# Patient Record
Sex: Female | Born: 1975
Health system: Southern US, Community
[De-identification: ages and names within clinical notes are randomized; demographics above are authoritative.]

## PROBLEM LIST (undated history)

## (undated) DIAGNOSIS — F329 Major depressive disorder, single episode, unspecified: Secondary | ICD-10-CM

## (undated) DIAGNOSIS — N179 Acute kidney failure, unspecified: Secondary | ICD-10-CM

## (undated) DIAGNOSIS — D62 Acute posthemorrhagic anemia: Secondary | ICD-10-CM

## (undated) DIAGNOSIS — I209 Angina pectoris, unspecified: Secondary | ICD-10-CM

## (undated) DIAGNOSIS — J42 Unspecified chronic bronchitis: Secondary | ICD-10-CM

## (undated) DIAGNOSIS — L0291 Cutaneous abscess, unspecified: Secondary | ICD-10-CM

## (undated) DIAGNOSIS — Z8489 Family history of other specified conditions: Secondary | ICD-10-CM

## (undated) DIAGNOSIS — F32A Depression, unspecified: Secondary | ICD-10-CM

## (undated) DIAGNOSIS — Z9289 Personal history of other medical treatment: Secondary | ICD-10-CM

## (undated) DIAGNOSIS — F419 Anxiety disorder, unspecified: Secondary | ICD-10-CM

## (undated) DIAGNOSIS — E785 Hyperlipidemia, unspecified: Secondary | ICD-10-CM

## (undated) DIAGNOSIS — R0902 Hypoxemia: Secondary | ICD-10-CM

## (undated) DIAGNOSIS — G8929 Other chronic pain: Secondary | ICD-10-CM

## (undated) DIAGNOSIS — I96 Gangrene, not elsewhere classified: Secondary | ICD-10-CM

## (undated) DIAGNOSIS — E119 Type 2 diabetes mellitus without complications: Secondary | ICD-10-CM

## (undated) DIAGNOSIS — M545 Low back pain: Secondary | ICD-10-CM

## (undated) DIAGNOSIS — G629 Polyneuropathy, unspecified: Secondary | ICD-10-CM

## (undated) DIAGNOSIS — I38 Endocarditis, valve unspecified: Secondary | ICD-10-CM

## (undated) DIAGNOSIS — M7918 Myalgia, other site: Secondary | ICD-10-CM

## (undated) DIAGNOSIS — R600 Localized edema: Secondary | ICD-10-CM

## (undated) DIAGNOSIS — H269 Unspecified cataract: Secondary | ICD-10-CM

## (undated) DIAGNOSIS — Z8619 Personal history of other infectious and parasitic diseases: Secondary | ICD-10-CM

## (undated) DIAGNOSIS — D72829 Elevated white blood cell count, unspecified: Secondary | ICD-10-CM

## (undated) DIAGNOSIS — E039 Hypothyroidism, unspecified: Secondary | ICD-10-CM

## (undated) DIAGNOSIS — K219 Gastro-esophageal reflux disease without esophagitis: Secondary | ICD-10-CM

## (undated) DIAGNOSIS — F319 Bipolar disorder, unspecified: Secondary | ICD-10-CM

## (undated) DIAGNOSIS — I509 Heart failure, unspecified: Secondary | ICD-10-CM

## (undated) DIAGNOSIS — W19XXXA Unspecified fall, initial encounter: Secondary | ICD-10-CM

## (undated) DIAGNOSIS — R011 Cardiac murmur, unspecified: Secondary | ICD-10-CM

## (undated) DIAGNOSIS — G4733 Obstructive sleep apnea (adult) (pediatric): Secondary | ICD-10-CM

## (undated) DIAGNOSIS — E669 Obesity, unspecified: Secondary | ICD-10-CM

## (undated) DIAGNOSIS — L0390 Cellulitis, unspecified: Secondary | ICD-10-CM

## (undated) DIAGNOSIS — J189 Pneumonia, unspecified organism: Secondary | ICD-10-CM

## (undated) DIAGNOSIS — Z9989 Dependence on other enabling machines and devices: Secondary | ICD-10-CM

## (undated) DIAGNOSIS — B009 Herpesviral infection, unspecified: Secondary | ICD-10-CM

## (undated) DIAGNOSIS — T8781 Dehiscence of amputation stump: Secondary | ICD-10-CM

## (undated) DIAGNOSIS — M797 Fibromyalgia: Secondary | ICD-10-CM

## (undated) DIAGNOSIS — D638 Anemia in other chronic diseases classified elsewhere: Secondary | ICD-10-CM

## (undated) DIAGNOSIS — E43 Unspecified severe protein-calorie malnutrition: Secondary | ICD-10-CM

## (undated) DIAGNOSIS — R87619 Unspecified abnormal cytological findings in specimens from cervix uteri: Secondary | ICD-10-CM

## (undated) DIAGNOSIS — I10 Essential (primary) hypertension: Secondary | ICD-10-CM

## (undated) DIAGNOSIS — M726 Necrotizing fasciitis: Secondary | ICD-10-CM

## (undated) HISTORY — DX: Acute kidney failure, unspecified: N17.9

## (undated) HISTORY — DX: Acute posthemorrhagic anemia: D62

## (undated) HISTORY — DX: Unspecified severe protein-calorie malnutrition: E43

## (undated) HISTORY — DX: Hypothyroidism, unspecified: E03.9

## (undated) HISTORY — PX: CORONARY ARTERY BYPASS GRAFT: SHX141

## (undated) HISTORY — DX: Dehiscence of amputation stump: T87.81

## (undated) HISTORY — DX: Hypoxemia: R09.02

## (undated) HISTORY — DX: Hyperlipidemia, unspecified: E78.5

## (undated) HISTORY — DX: Elevated white blood cell count, unspecified: D72.829

## (undated) HISTORY — DX: Heart failure, unspecified: I50.9

## (undated) HISTORY — PX: EYE SURGERY: SHX253

## (undated) HISTORY — DX: Myalgia, other site: M79.18

## (undated) HISTORY — DX: Low back pain: M54.5

## (undated) HISTORY — DX: Essential (primary) hypertension: I10

## (undated) HISTORY — DX: Unspecified fall, initial encounter: W19.XXXA

## (undated) HISTORY — DX: Personal history of other infectious and parasitic diseases: Z86.19

## (undated) HISTORY — DX: Localized edema: R60.0

## (undated) HISTORY — PX: INCISION AND DRAINAGE ABSCESS: SHX5864

---
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
## 1997-09-09 ENCOUNTER — Encounter: Admission: RE | Admit: 1997-09-09 | Discharge: 1997-09-09 | Payer: Self-pay | Admitting: Internal Medicine

## 1998-09-08 ENCOUNTER — Emergency Department (HOSPITAL_COMMUNITY): Admission: EM | Admit: 1998-09-08 | Discharge: 1998-09-08 | Payer: Self-pay | Admitting: Emergency Medicine

## 1998-09-21 ENCOUNTER — Emergency Department (HOSPITAL_COMMUNITY): Admission: EM | Admit: 1998-09-21 | Discharge: 1998-09-21 | Payer: Self-pay | Admitting: Emergency Medicine

## 1998-09-24 ENCOUNTER — Encounter: Admission: RE | Admit: 1998-09-24 | Discharge: 1998-09-24 | Payer: Self-pay | Admitting: Internal Medicine

## 1998-10-12 ENCOUNTER — Encounter: Admission: RE | Admit: 1998-10-12 | Discharge: 1998-10-12 | Payer: Self-pay | Admitting: Internal Medicine

## 1998-11-19 ENCOUNTER — Encounter: Admission: RE | Admit: 1998-11-19 | Discharge: 1998-11-19 | Payer: Self-pay | Admitting: Hematology and Oncology

## 2000-01-23 ENCOUNTER — Emergency Department (HOSPITAL_COMMUNITY): Admission: EM | Admit: 2000-01-23 | Discharge: 2000-01-23 | Payer: Self-pay | Admitting: Emergency Medicine

## 2000-01-28 ENCOUNTER — Encounter: Admission: RE | Admit: 2000-01-28 | Discharge: 2000-01-28 | Payer: Self-pay | Admitting: Internal Medicine

## 2000-02-08 ENCOUNTER — Encounter: Admission: RE | Admit: 2000-02-08 | Discharge: 2000-02-08 | Payer: Self-pay | Admitting: Hematology and Oncology

## 2000-04-17 ENCOUNTER — Encounter: Admission: RE | Admit: 2000-04-17 | Discharge: 2000-04-17 | Payer: Self-pay | Admitting: Internal Medicine

## 2000-05-23 DIAGNOSIS — I38 Endocarditis, valve unspecified: Secondary | ICD-10-CM

## 2000-05-23 DIAGNOSIS — D638 Anemia in other chronic diseases classified elsewhere: Secondary | ICD-10-CM

## 2000-05-23 HISTORY — DX: Endocarditis, valve unspecified: I38

## 2000-05-23 HISTORY — DX: Anemia in other chronic diseases classified elsewhere: D63.8

## 2000-05-30 ENCOUNTER — Encounter: Payer: Self-pay | Admitting: Emergency Medicine

## 2000-05-31 ENCOUNTER — Inpatient Hospital Stay (HOSPITAL_COMMUNITY): Admission: EM | Admit: 2000-05-31 | Discharge: 2000-06-22 | Payer: Self-pay | Admitting: Emergency Medicine

## 2000-06-01 ENCOUNTER — Encounter: Payer: Self-pay | Admitting: Internal Medicine

## 2000-06-06 ENCOUNTER — Encounter: Payer: Self-pay | Admitting: Internal Medicine

## 2000-06-10 ENCOUNTER — Encounter: Payer: Self-pay | Admitting: Infectious Diseases

## 2000-06-12 ENCOUNTER — Encounter: Payer: Self-pay | Admitting: Infectious Diseases

## 2000-06-15 ENCOUNTER — Encounter: Payer: Self-pay | Admitting: Infectious Diseases

## 2000-07-20 ENCOUNTER — Ambulatory Visit (HOSPITAL_COMMUNITY): Admission: RE | Admit: 2000-07-20 | Discharge: 2000-07-20 | Payer: Self-pay | Admitting: Cardiovascular Disease

## 2000-07-27 ENCOUNTER — Encounter: Admission: RE | Admit: 2000-07-27 | Discharge: 2000-07-27 | Payer: Self-pay | Admitting: Hematology and Oncology

## 2000-08-28 ENCOUNTER — Encounter: Admission: RE | Admit: 2000-08-28 | Discharge: 2000-08-28 | Payer: Self-pay | Admitting: Internal Medicine

## 2001-07-23 ENCOUNTER — Encounter: Admission: RE | Admit: 2001-07-23 | Discharge: 2001-07-23 | Payer: Self-pay | Admitting: Internal Medicine

## 2001-08-06 ENCOUNTER — Encounter: Admission: RE | Admit: 2001-08-06 | Discharge: 2001-08-06 | Payer: Self-pay | Admitting: Internal Medicine

## 2001-08-20 ENCOUNTER — Encounter: Admission: RE | Admit: 2001-08-20 | Discharge: 2001-08-20 | Payer: Self-pay | Admitting: Internal Medicine

## 2001-11-02 ENCOUNTER — Encounter: Admission: RE | Admit: 2001-11-02 | Discharge: 2001-11-02 | Payer: Self-pay | Admitting: Internal Medicine

## 2001-12-24 ENCOUNTER — Encounter: Admission: RE | Admit: 2001-12-24 | Discharge: 2001-12-24 | Payer: Self-pay | Admitting: Internal Medicine

## 2002-03-18 ENCOUNTER — Encounter: Admission: RE | Admit: 2002-03-18 | Discharge: 2002-03-18 | Payer: Self-pay | Admitting: Internal Medicine

## 2002-03-25 ENCOUNTER — Encounter: Admission: RE | Admit: 2002-03-25 | Discharge: 2002-03-25 | Payer: Self-pay | Admitting: Internal Medicine

## 2002-05-06 ENCOUNTER — Encounter: Admission: RE | Admit: 2002-05-06 | Discharge: 2002-05-06 | Payer: Self-pay | Admitting: Internal Medicine

## 2003-04-08 ENCOUNTER — Encounter: Admission: RE | Admit: 2003-04-08 | Discharge: 2003-04-08 | Payer: Self-pay | Admitting: Internal Medicine

## 2003-08-04 ENCOUNTER — Encounter: Admission: RE | Admit: 2003-08-04 | Discharge: 2003-08-04 | Payer: Self-pay | Admitting: Internal Medicine

## 2005-12-22 ENCOUNTER — Ambulatory Visit: Payer: Self-pay | Admitting: Internal Medicine

## 2005-12-29 ENCOUNTER — Ambulatory Visit: Payer: Self-pay | Admitting: Internal Medicine

## 2006-01-05 ENCOUNTER — Ambulatory Visit: Payer: Self-pay | Admitting: Internal Medicine

## 2006-03-03 ENCOUNTER — Ambulatory Visit: Payer: Self-pay | Admitting: Internal Medicine

## 2006-03-08 ENCOUNTER — Encounter: Payer: Self-pay | Admitting: Cardiology

## 2006-03-08 ENCOUNTER — Ambulatory Visit: Admission: RE | Admit: 2006-03-08 | Discharge: 2006-03-08 | Payer: Self-pay | Admitting: Internal Medicine

## 2006-03-08 ENCOUNTER — Ambulatory Visit: Payer: Self-pay | Admitting: Cardiology

## 2006-03-20 ENCOUNTER — Ambulatory Visit: Payer: Self-pay | Admitting: Internal Medicine

## 2007-05-24 DIAGNOSIS — R87619 Unspecified abnormal cytological findings in specimens from cervix uteri: Secondary | ICD-10-CM

## 2007-05-24 HISTORY — DX: Unspecified abnormal cytological findings in specimens from cervix uteri: R87.619

## 2007-08-25 ENCOUNTER — Emergency Department (HOSPITAL_COMMUNITY): Admission: EM | Admit: 2007-08-25 | Discharge: 2007-08-25 | Payer: Self-pay | Admitting: Family Medicine

## 2007-08-29 ENCOUNTER — Ambulatory Visit: Payer: Self-pay | Admitting: Advanced Practice Midwife

## 2007-08-29 ENCOUNTER — Ambulatory Visit: Payer: Self-pay | Admitting: Infectious Disease

## 2007-08-29 ENCOUNTER — Inpatient Hospital Stay (HOSPITAL_COMMUNITY): Admission: AD | Admit: 2007-08-29 | Discharge: 2007-08-29 | Payer: Self-pay | Admitting: Obstetrics and Gynecology

## 2007-08-29 DIAGNOSIS — L0292 Furuncle, unspecified: Secondary | ICD-10-CM | POA: Insufficient documentation

## 2007-08-29 DIAGNOSIS — E1159 Type 2 diabetes mellitus with other circulatory complications: Secondary | ICD-10-CM | POA: Insufficient documentation

## 2007-08-29 DIAGNOSIS — I10 Essential (primary) hypertension: Secondary | ICD-10-CM | POA: Insufficient documentation

## 2007-08-29 DIAGNOSIS — E785 Hyperlipidemia, unspecified: Secondary | ICD-10-CM | POA: Insufficient documentation

## 2007-08-29 DIAGNOSIS — E1122 Type 2 diabetes mellitus with diabetic chronic kidney disease: Secondary | ICD-10-CM | POA: Insufficient documentation

## 2007-08-29 DIAGNOSIS — L0293 Carbuncle, unspecified: Secondary | ICD-10-CM

## 2007-11-26 ENCOUNTER — Telehealth (INDEPENDENT_AMBULATORY_CARE_PROVIDER_SITE_OTHER): Payer: Self-pay | Admitting: Internal Medicine

## 2007-12-06 ENCOUNTER — Encounter: Payer: Self-pay | Admitting: Internal Medicine

## 2007-12-06 ENCOUNTER — Encounter (INDEPENDENT_AMBULATORY_CARE_PROVIDER_SITE_OTHER): Payer: Self-pay | Admitting: Internal Medicine

## 2007-12-06 ENCOUNTER — Ambulatory Visit: Payer: Self-pay | Admitting: *Deleted

## 2007-12-06 DIAGNOSIS — K219 Gastro-esophageal reflux disease without esophagitis: Secondary | ICD-10-CM | POA: Insufficient documentation

## 2007-12-06 LAB — CONVERTED CEMR LAB
Chlamydia, DNA Probe: NEGATIVE
GC Probe Amp, Genital: NEGATIVE
Hgb A1c MFr Bld: 13.6 %
Pap Smear: ABNORMAL

## 2007-12-07 LAB — CONVERTED CEMR LAB
ALT: 15 units/L (ref 0–35)
AST: 12 units/L (ref 0–37)
BUN: 13 mg/dL (ref 6–23)
Basophils Absolute: 0 10*3/uL (ref 0.0–0.1)
Basophils Relative: 0 % (ref 0–1)
Bilirubin Urine: NEGATIVE
CO2: 24 meq/L (ref 19–32)
Calcium: 9.6 mg/dL (ref 8.4–10.5)
Candida species: NEGATIVE
Cholesterol: 226 mg/dL — ABNORMAL HIGH (ref 0–200)
Creatinine, Ser: 0.81 mg/dL (ref 0.40–1.20)
Eosinophils Relative: 1 % (ref 0–5)
HCT: 41.2 % (ref 36.0–46.0)
HDL: 46 mg/dL (ref >39)
Hemoglobin: 13.2 g/dL (ref 12.0–15.0)
Ketones, ur: NEGATIVE mg/dL
MCHC: 32 g/dL (ref 30.0–36.0)
Monocytes Absolute: 0.3 10*3/uL (ref 0.1–1.0)
Monocytes Relative: 7 % (ref 3–12)
RBC / HPF: NONE SEEN (ref <3)
RBC: 5.05 M/uL (ref 3.87–5.11)
RDW: 13.6 % (ref 11.5–15.5)
Specific Gravity, Urine: 1.031 — ABNORMAL HIGH (ref 1.005–1.03)
Total Bilirubin: 0.5 mg/dL (ref 0.3–1.2)
Total CHOL/HDL Ratio: 4.9
Trichomonal Vaginitis: NEGATIVE
Urine Glucose: >1000 mg/dL — AB
Urobilinogen, UA: 0.2 (ref 0.0–1.0)
VLDL: 23 mg/dL (ref 0–40)
WBC, UA: NONE SEEN cells/hpf (ref <3)

## 2007-12-13 ENCOUNTER — Ambulatory Visit: Payer: Self-pay | Admitting: Infectious Diseases

## 2007-12-13 ENCOUNTER — Ambulatory Visit: Payer: Self-pay | Admitting: Internal Medicine

## 2007-12-13 DIAGNOSIS — Z8742 Personal history of other diseases of the female genital tract: Secondary | ICD-10-CM | POA: Insufficient documentation

## 2007-12-13 LAB — CONVERTED CEMR LAB: Blood Glucose, Fingerstick: 343

## 2008-02-06 ENCOUNTER — Ambulatory Visit: Payer: Self-pay | Admitting: Obstetrics and Gynecology

## 2008-02-06 ENCOUNTER — Other Ambulatory Visit: Admission: RE | Admit: 2008-02-06 | Discharge: 2008-02-06 | Payer: Self-pay | Admitting: *Deleted

## 2008-03-18 ENCOUNTER — Encounter (INDEPENDENT_AMBULATORY_CARE_PROVIDER_SITE_OTHER): Payer: Self-pay | Admitting: Internal Medicine

## 2008-03-24 ENCOUNTER — Telehealth: Payer: Self-pay | Admitting: *Deleted

## 2008-08-21 ENCOUNTER — Inpatient Hospital Stay (HOSPITAL_COMMUNITY): Admission: AD | Admit: 2008-08-21 | Discharge: 2008-08-21 | Payer: Self-pay | Admitting: Obstetrics & Gynecology

## 2008-11-29 ENCOUNTER — Other Ambulatory Visit: Payer: Self-pay | Admitting: Obstetrics & Gynecology

## 2008-11-29 ENCOUNTER — Other Ambulatory Visit: Payer: Self-pay | Admitting: Emergency Medicine

## 2008-11-29 ENCOUNTER — Inpatient Hospital Stay (HOSPITAL_COMMUNITY): Admission: EM | Admit: 2008-11-29 | Discharge: 2008-12-02 | Payer: Self-pay | Admitting: Internal Medicine

## 2008-11-29 ENCOUNTER — Ambulatory Visit: Payer: Self-pay | Admitting: Internal Medicine

## 2008-11-29 ENCOUNTER — Encounter (INDEPENDENT_AMBULATORY_CARE_PROVIDER_SITE_OTHER): Payer: Self-pay | Admitting: Internal Medicine

## 2008-11-30 ENCOUNTER — Encounter: Payer: Self-pay | Admitting: Internal Medicine

## 2008-11-30 ENCOUNTER — Encounter: Payer: Self-pay | Admitting: *Deleted

## 2008-11-30 LAB — CONVERTED CEMR LAB: HDL: 46 mg/dL

## 2008-12-02 ENCOUNTER — Encounter (INDEPENDENT_AMBULATORY_CARE_PROVIDER_SITE_OTHER): Payer: Self-pay | Admitting: Internal Medicine

## 2008-12-11 ENCOUNTER — Encounter: Payer: Self-pay | Admitting: *Deleted

## 2009-02-14 ENCOUNTER — Inpatient Hospital Stay (HOSPITAL_COMMUNITY): Admission: EM | Admit: 2009-02-14 | Discharge: 2009-02-18 | Payer: Self-pay | Admitting: Emergency Medicine

## 2009-02-16 ENCOUNTER — Ambulatory Visit: Payer: Self-pay | Admitting: Infectious Diseases

## 2009-02-19 ENCOUNTER — Encounter: Payer: Self-pay | Admitting: Internal Medicine

## 2009-08-18 ENCOUNTER — Encounter: Payer: Self-pay | Admitting: *Deleted

## 2009-12-15 ENCOUNTER — Telehealth (INDEPENDENT_AMBULATORY_CARE_PROVIDER_SITE_OTHER): Payer: Self-pay | Admitting: *Deleted

## 2010-03-21 ENCOUNTER — Ambulatory Visit: Payer: Self-pay | Admitting: Internal Medicine

## 2010-03-21 ENCOUNTER — Inpatient Hospital Stay (HOSPITAL_COMMUNITY): Admission: EM | Admit: 2010-03-21 | Discharge: 2010-03-28 | Payer: Self-pay | Admitting: Emergency Medicine

## 2010-03-21 ENCOUNTER — Encounter: Payer: Self-pay | Admitting: Internal Medicine

## 2010-03-26 ENCOUNTER — Encounter: Payer: Self-pay | Admitting: Internal Medicine

## 2010-04-28 ENCOUNTER — Encounter: Payer: Self-pay | Admitting: Internal Medicine

## 2010-06-24 NOTE — Discharge Summary (Signed)
Summary: Hospital Discharge Update    Hospital Discharge Update:  Date of Admission: 04/21/2010 Date of Discharge: 03/27/2010  Brief Summary:  Patient is a 35 year old woman with a history of poorly controlled DM II and MRSA skin abcesses who presented to [facility] ED with a painful draining nodule on her abdomen.  She first noted a small pustule on her abdomen the Thursday prior to admission that she put a heating pad on and popped it when it came to a head.  Over the next 3 days it continued to drain and get more sore so she went to the ED.  She was admitted and started on IV antibiotics and pain control medications.  She was found to also have a HgBA1C of 13.4 and she admitted that she was not taking her insulin on a regular basis because of cost.  She was started on Novalin 70/30 and restarted on Metformin slowly tapering up to the goal dose of 1000 mg two times a day.  Two days prior to discharge wound culture results showed MRSA that was susceptible to Bactrim.  General surgery was consulted for possible need for debridement of the wound bed.  They evaluated the wound and scheduled her for wound debridement.  The next morning she had the wound debrided and was transitioned to oral antibiotics.  She was discharged the day after surgery with instructions to continue 3 more weeks of bactrim for a total course of antibiotics of 4 weeks.  She will follow up with Dr. Nedra Hai on 04/12/10 at 2:40 pm and [facility] on 04/02/10 at 1:30 pm with Dr. Ronny Flurry.  At her follow up appointment she will need a bmet done for watching her blood sugars.  Her prescriptions for Bactrim DS, Metformin 500 mg two times a day, and Novalin 70/30 [facility] [address]  Labs needed at follow-up: Basic metabolic panel  Other follow-up issues:  She will need to visit with Marlana Latus the financial counselor to try to qualify for the orange card for assistance with medication costs.  Problem list changes:  Removed  problem of VAGINAL DISCHARGE (ICD-623.5)  Medication list changes:  Removed medication of GLIPIZIDE 10 MG TABS (GLIPIZIDE) Take 1 tablet by mouth two times a day Removed medication of AMOXICILLIN-POT CLAVULANATE 875-125 MG TABS (AMOXICILLIN-POT CLAVULANATE) Take one tab by mouth every 12 hrs for 7 days. Removed medication of HYDROCODONE-ACETAMINOPHEN 5-500 MG TABS (HYDROCODONE-ACETAMINOPHEN) Take one tab by mouth every 6 hrs as needed for pain. Removed medication of FAMOTIDINE 20 MG  TABS (FAMOTIDINE) Take 1 tablet by mouth two times a day Changed medication from METFORMIN HCL 1000 MG  TABS (METFORMIN HCL) Take 1 tablet by mouth two times a day to METFORMIN HCL 500 MG TABS (METFORMIN HCL) Take 1 tablet by mouth two times a day for blood sugars - Signed Added new medication of SULFAMETHOXAZOLE-TMP DS 800-160 MG TABS (SULFAMETHOXAZOLE-TRIMETHOPRIM) Take 2 tablet by mouth two times a day - Signed Added new medication of NOVOLIN 70/30 70-30 % SUSP (INSULIN ISOPHANE & REGULAR) Inject 20 units subcutaneously in the morning and before bed. - Signed Added new medication of LISINOPRIL 5 MG TABS (LISINOPRIL) Take 1 tablet by mouth once a day - Signed Rx of METFORMIN HCL 500 MG TABS (METFORMIN HCL) Take 1 tablet by mouth two times a day for blood sugars;  #60 x 1;  Signed;  Entered by: Trish Fountain MD;  Authorized by: Trish Fountain MD;  Method used:  [facility]  [address] [id number]*, [address] [place], [place], [place]  [address], Ph: [phone], Fax: [phone] Rx of SULFAMETHOXAZOLE-TMP DS 800-160 MG TABS (SULFAMETHOXAZOLE-TRIMETHOPRIM) Take 2 tablet by mouth two times a day;  #96 x 0;  Signed;  Entered by: Trish Fountain MD;  Authorized by: Trish Fountain MD;  Method used: [facility]  [address] [facility] [phone]*, [address] [place], [place], [place]  [address], Ph: [phone], Fax: [phone] Rx of NOVOLIN 70/30 70-30 % SUSP (INSULIN ISOPHANE &  REGULAR) Inject 20 units subcutaneously in the morning and before bed.;  #2 vials x 1;  Signed;  Entered by: Trish Fountain MD;  Authorized by: Trish Fountain MD;  Method used: [facility] [phone]*, [address], [place], [place], [place]  [address], Ph: [phone], Fax: [phone] Rx of LISINOPRIL 5 MG TABS (LISINOPRIL) Take 1 tablet by mouth once a day;  #30 x 1;  Signed;  Entered by: Trish Fountain MD;  Authorized by: Trish Fountain MD;  Method used: [facility]  [address] [phone]*, [address] [place], [place],   [address], Ph: [phone], Fax: [phone]  Discharge medications:  METFORMIN HCL 500 MG TABS (METFORMIN HCL) Take 1 tablet by mouth two times a day for blood sugars PRAVACHOL 40 MG  TABS (PRAVASTATIN SODIUM) Take 1 tablet by mouth once a day SULFAMETHOXAZOLE-TMP DS 800-160 MG TABS (SULFAMETHOXAZOLE-TRIMETHOPRIM) Take 2 tablet by mouth two times a day NOVOLIN 70/30 70-30 % SUSP (INSULIN ISOPHANE & REGULAR) Inject 20 units subcutaneously in the morning and before bed. LISINOPRIL 5 MG TABS (LISINOPRIL) Take 1 tablet by mouth once a day  Other patient instructions:  Please take BACTRIM DS 2 tablets twice daily until 11/27 for the skin infection We restarted METFORMIN 500 mg two times a day WE started NOVOLIN 70/30 inject 20 units Subcutaneously twice daily Continue LISINOPRIL 5 mg daily Continue Pravachol 40 mg daily You have a follow up with the [facility] on 04/02/10 at 1:30 pm with Dr. Iris Pert have a follow up with Dr. Ninfa Linden on 04/12/10 at 2:30 pm.  You need to be there by 2:15 IF you have any problems please call the [facility] at [phone]  Note: Hospital Discharge Medications & Other Instructions handout was printed, one copy for patient and a second copy to be placed in hospital chart.

## 2010-06-24 NOTE — Miscellaneous (Signed)
Summary: [facility] CERTIFICATION   Imported By: Enedina Finner 05/06/2010 09:39:29  _____________________________________________________________________  External Attachment:    Type:   Image     Comment:   External Document

## 2010-06-24 NOTE — Progress Notes (Signed)
Summary: Appt List for [name] [name]  Phone Note Outgoing Call   Details for Reason: Appt list for [name] [name] Summary of Call: Called patient again to offer an appointment with A PCP for a 6 month check up.  No answer and patient was also sent a letter in March and did not respond. Initial call taken by: Enedina Finner,  December 15, 2009 10:26 AM

## 2010-06-24 NOTE — Initial Assessments (Signed)
INTERNAL MEDICINE ADMISSION HISTORY AND PHYSICAL  Attending: Dr. Desiree Hane First contact: Dr. Obie Dredge [phone] Second contact: Dr. Ina Homes  [phone] [facility], after-hours: [phone]  PCP: Dr. Tyrell Antonio  CC: painful, draining nodule on abdomen  HPI: [name] is a 35 year old woman with poorly controlled DM2 who presented to the ED complaining of a painful, draining subcutaneous nodule on her abdomen. She has a history of multiple skin abscesses over the past several years and was twice hospitalized in 2010 for abscess that required surgical I&D and IV antibiotics. She first noticed a small pustule on her abdomen on Thursday, which she believed to be a whitehead or ingrown hair. She squeezed this pustule and expelled a small amout of white pus. Since Friday, however, this lesion has grown in size and has become acutely painful. She describes this pain as "stabbing" into her abdomen. She also endorses intermittent chills and fevers for the past few days as well as some left-sided arm numbess, headache, and blurry vision. She also reports that her CBGs, which are normally in the 200s, have been in the 3-500s over the past few days.   ALLERGIES: NKDA  PAST MEDICAL HISTORY: Diabetes mellitus, type II: A1C 12.9 in 01/2009 Hyperlipidemia: LDL 125, HDL18, TG178, CH179 in 01/2009 Hypertension Anemia: baseline 10 (iron deficiency and AOCD) Morbid obesity Hx of perianal abscess s/p I&D in 01/2009 (E. 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 No children. Smokes  ~4cigarettes/day x 8 years but it trying to quit. Denies alcohol. Reports occassional marijuana use but not for the past 5-6 months. Denies other drug use.    FAMILY HISTORY mother CHF, DM2, chronic kidney disease on HD father: in prison siblings: no medical problems   ROS: per HPI; also positive for non-productive cough and chronic constipation; all other systems negative  VITALS: T: 99.4 P: 118>99 BP:117/69  R: 20 O2SAT: 96% ON:RA  PHYSICAL EXAM: GEN: NAD, obese HEENT: PERRL, EOMI, no icterus, no pallor NECK: supple, no JVD, no cervical LN LUNGS: clear to auscultation bilaterally, no wheezing/crackles.  CVS: RRR, distant heart sounds, no murmur/rubs/gallops ABD: 1x2cm open skin lesion with yellow, purulent drainage, surrounding erythema, and induration ( ~10x10cm) EXTREMITIES: Trace edema or cyanosis NEURO: Alert &Oriented x3, no focal motor or sensory deficits.     LABS: CBC+Diff  WBC                                      10.7  h      4.0-10.5         K/uL  RBC                                      3.83       l      3.87-5.11        MIL/uL  Hemoglobin (HGB)                         9.9        l      12.0-15.0        g/dL  Hematocrit (HCT)                         30.0       l      36.0-46.0        %  MCV                                      78.3              78.0-100.0       fL  MCH -                                    25.8       l      26.0-34.0        pg  MCHC                                      33.0              30.0-36.0        g/dL  RDW                                      13.4              11.5-15.5        %  Platelet Count (PLT)                     248               150-400          K/uL  Neutrophils, %                           74                43-77            %  Lymphocytes, %                           15                12-46            %  Monocytes, %  10                3-12             %  Eosinophils, %                           1                 0-5              %  Basophils, %                             0                 0-1              %  Neutrophils, Absolute                    7.9        h      1.7-7.7          K/uL  Lymphocytes, Absolute                    1.6               0.7-4.0          K/uL  Monocytes, Absolute                      1.1        h      0.1-1.0          K/uL  Eosinophils, Absolute                    0.1               0.0-0.7          K/uL  Basophils, Absolute                      0.0               0.0-0.1          K/uL  BMET  Sodium (NA)                              132        l      135-145          mEq/L  Potassium (K)                            3.9               3.5-5.1          mEq/L  Chloride                                 98                96-112           mEq/L  CO2  26                19-32            mEq/L  Glucose                                  341        h      70-99            mg/dL  BUN                                      8                 6-23             mg/dL  Creatinine                               0.75              0.4-1.2          mg/dL  GFR, Est Non African American            >60               >60              mL/min  GFR, Est African American                >60               >60              mL/min    Oversized comment, see footnote  1  Calcium                                  8.7               8.4-10.5         mg/dL   Clinical Data: Difficulty  breathing.  Headache.  History of  hypertension and diabetes.    PORTABLE CHEST - 1 VIEW    Comparison: None.    Findings: 1545 hours.  There are low lung volumes with mild   resulting bibasilar atelectasis.  The heart size and mediastinal   contours are normal.  There is no pleural effusion, edema or   confluent airspace opacity.  Thoracic spine degenerative changes   are present.    IMPRESSION:   Low lung volumes with bibasilar atelectasis.    Read By:  Vivia Ewing,  M.D.   Released By:  Vivia Ewing,  M.D.  [place]:  (1) Draining abdominal skin lesion: The lesion appears open, no fluctuation; does not appear to represent abscess and therefore does not require further I&D. However, surrounding induration is concerning for cellulitis likely related to poorly controlled DM. Will admit for IV antibiotics. Wound culture pending. Recurrent abscesses/infections likely due to poorly controlled DM; however, we will also check HIV and UDS. Wound care consult tomorrow.    (2) DM2: Has not had follow-up in the clinic for more than one year. Discussed with the patient her need to improve diabetic control to avoid future infections. Will  Check A1c, start Lantus, and cover with SSI.  (3) HL: Will restart pravastatin and check FLP.   (4) Tobacco abuse. Will give nicotine patch and consult SW for smoking cessation counseling.   (5)VTE PROPH: lovenox

## 2010-06-24 NOTE — Letter (Signed)
Summary: Recall Letter  [facility]  [address]   [place], [place] [address]   Phone: [phone]  Fax: [phone]    08/18/2009   JOSHLYN SCHIMMOELLER [address] [place], [place]  [address]   Dear  Ms. STEPHANNIE Nuckles,  You are due to follow-up with a doctor at the [facility].  We were unable to contact you by phone.  If you wold like to schedule a visit, please call [phone].    If you are getting your healthcare somewhere else, please call us and we will take your name off our list.  Healthy Regards,    Illene Regulus, Director [facility]

## 2010-07-16 ENCOUNTER — Encounter: Payer: Self-pay | Admitting: Internal Medicine

## 2010-07-16 DIAGNOSIS — R8789 Other abnormal findings in specimens from female genital organs: Secondary | ICD-10-CM

## 2010-07-16 DIAGNOSIS — E119 Type 2 diabetes mellitus without complications: Secondary | ICD-10-CM

## 2010-08-03 LAB — BASIC METABOLIC PANEL
BUN: 7 mg/dL (ref 6–23)
CO2: 28 mEq/L (ref 19–32)
CO2: 28 mEq/L (ref 19–32)
CO2: 29 mEq/L (ref 19–32)
CO2: 30 mEq/L (ref 19–32)
Calcium: 8.4 mg/dL (ref 8.4–10.5)
Calcium: 8.5 mg/dL (ref 8.4–10.5)
Calcium: 8.9 mg/dL (ref 8.4–10.5)
Calcium: 9.1 mg/dL (ref 8.4–10.5)
Chloride: 104 mEq/L (ref 96–112)
Chloride: 105 mEq/L (ref 96–112)
Creatinine, Ser: 0.73 mg/dL (ref 0.4–1.2)
Creatinine, Ser: 0.89 mg/dL (ref 0.4–1.2)
GFR calc Af Amer: 53 mL/min — ABNORMAL LOW (ref >60)
GFR calc Af Amer: >60 mL/min (ref >60)
GFR calc Af Amer: >60 mL/min (ref >60)
GFR calc non Af Amer: >60 mL/min (ref >60)
Glucose, Bld: 116 mg/dL — ABNORMAL HIGH (ref 70–99)
Glucose, Bld: 133 mg/dL — ABNORMAL HIGH (ref 70–99)
Potassium: 4 mEq/L (ref 3.5–5.1)
Sodium: 136 mEq/L (ref 135–145)
Sodium: 137 mEq/L (ref 135–145)

## 2010-08-03 LAB — GLUCOSE, CAPILLARY
Glucose-Capillary: 106 mg/dL — ABNORMAL HIGH (ref 70–99)
Glucose-Capillary: 117 mg/dL — ABNORMAL HIGH (ref 70–99)
Glucose-Capillary: 120 mg/dL — ABNORMAL HIGH (ref 70–99)
Glucose-Capillary: 120 mg/dL — ABNORMAL HIGH (ref 70–99)
Glucose-Capillary: 130 mg/dL — ABNORMAL HIGH (ref 70–99)
Glucose-Capillary: 158 mg/dL — ABNORMAL HIGH (ref 70–99)
Glucose-Capillary: 161 mg/dL — ABNORMAL HIGH (ref 70–99)
Glucose-Capillary: 173 mg/dL — ABNORMAL HIGH (ref 70–99)
Glucose-Capillary: 85 mg/dL (ref 70–99)
Glucose-Capillary: 91 mg/dL (ref 70–99)
Glucose-Capillary: 96 mg/dL (ref 70–99)
Glucose-Capillary: 98 mg/dL (ref 70–99)

## 2010-08-03 LAB — VANCOMYCIN, TROUGH: Vancomycin Tr: 17.9 ug/mL (ref 10.0–20.0)

## 2010-08-03 LAB — CBC
HCT: 27.7 % — ABNORMAL LOW (ref 36.0–46.0)
Hemoglobin: 8.3 g/dL — ABNORMAL LOW (ref 12.0–15.0)
Hemoglobin: 8.9 g/dL — ABNORMAL LOW (ref 12.0–15.0)
MCH: 25.3 pg — ABNORMAL LOW (ref 26.0–34.0)
MCH: 25.4 pg — ABNORMAL LOW (ref 26.0–34.0)
MCH: 25.5 pg — ABNORMAL LOW (ref 26.0–34.0)
MCH: 25.5 pg — ABNORMAL LOW (ref 26.0–34.0)
MCHC: 31.7 g/dL (ref 30.0–36.0)
MCHC: 32.1 g/dL (ref 30.0–36.0)
MCV: 79.5 fL (ref 78.0–100.0)
Platelets: 305 10*3/uL (ref 150–400)
Platelets: 328 10*3/uL (ref 150–400)
RBC: 3.26 MIL/uL — ABNORMAL LOW (ref 3.87–5.11)
RBC: 3.51 MIL/uL — ABNORMAL LOW (ref 3.87–5.11)
RDW: 13.5 % (ref 11.5–15.5)

## 2010-08-03 LAB — DIFFERENTIAL
Basophils Absolute: 0 10*3/uL (ref 0.0–0.1)
Basophils Relative: 1 % (ref 0–1)
Eosinophils Absolute: 0.1 10*3/uL (ref 0.0–0.7)
Eosinophils Relative: 2 % (ref 0–5)

## 2010-08-04 LAB — BASIC METABOLIC PANEL
CO2: 28 mEq/L (ref 19–32)
Calcium: 8.4 mg/dL (ref 8.4–10.5)
Calcium: 8.7 mg/dL (ref 8.4–10.5)
Chloride: 98 mEq/L (ref 96–112)
Creatinine, Ser: 0.75 mg/dL (ref 0.4–1.2)
Creatinine, Ser: 0.8 mg/dL (ref 0.4–1.2)
GFR calc Af Amer: >60 mL/min (ref >60)
GFR calc Af Amer: >60 mL/min (ref >60)
GFR calc non Af Amer: >60 mL/min (ref >60)

## 2010-08-04 LAB — RAPID URINE DRUG SCREEN, HOSP PERFORMED
Amphetamines: NOT DETECTED
Benzodiazepines: NOT DETECTED
Cocaine: NOT DETECTED
Opiates: POSITIVE — AB
Tetrahydrocannabinol: NOT DETECTED

## 2010-08-04 LAB — GLUCOSE, CAPILLARY
Glucose-Capillary: 107 mg/dL — ABNORMAL HIGH (ref 70–99)
Glucose-Capillary: 146 mg/dL — ABNORMAL HIGH (ref 70–99)
Glucose-Capillary: 228 mg/dL — ABNORMAL HIGH (ref 70–99)
Glucose-Capillary: 300 mg/dL — ABNORMAL HIGH (ref 70–99)
Glucose-Capillary: 322 mg/dL — ABNORMAL HIGH (ref 70–99)

## 2010-08-04 LAB — CBC
MCH: 25.7 pg — ABNORMAL LOW (ref 26.0–34.0)
MCHC: 32.6 g/dL (ref 30.0–36.0)
Platelets: 232 10*3/uL (ref 150–400)
Platelets: 248 10*3/uL (ref 150–400)
RBC: 3.66 MIL/uL — ABNORMAL LOW (ref 3.87–5.11)
RBC: 3.83 MIL/uL — ABNORMAL LOW (ref 3.87–5.11)
RDW: 13.3 % (ref 11.5–15.5)
WBC: 10.7 10*3/uL — ABNORMAL HIGH (ref 4.0–10.5)

## 2010-08-04 LAB — LIPID PANEL
Cholesterol: 146 mg/dL (ref 0–200)
LDL Cholesterol: 94 mg/dL (ref 0–99)

## 2010-08-04 LAB — WOUND CULTURE

## 2010-08-04 LAB — DIFFERENTIAL
Basophils Absolute: 0 10*3/uL (ref 0.0–0.1)
Basophils Relative: 0 % (ref 0–1)
Eosinophils Absolute: 0.1 10*3/uL (ref 0.0–0.7)
Lymphocytes Relative: 15 % (ref 12–46)
Lymphs Abs: 1.6 10*3/uL (ref 0.7–4.0)
Lymphs Abs: 2.1 10*3/uL (ref 0.7–4.0)
Neutro Abs: 7.9 10*3/uL — ABNORMAL HIGH (ref 1.7–7.7)
Neutrophils Relative %: 65 % (ref 43–77)
Neutrophils Relative %: 74 % (ref 43–77)

## 2010-08-04 LAB — IRON AND TIBC
Iron: 12 ug/dL — ABNORMAL LOW (ref 42–135)
Saturation Ratios: 8 % — ABNORMAL LOW (ref 20–55)
UIBC: 143 ug/dL

## 2010-08-04 LAB — URINALYSIS, ROUTINE W REFLEX MICROSCOPIC
Bilirubin Urine: NEGATIVE
Ketones, ur: 15 mg/dL — AB
Nitrite: NEGATIVE
Urobilinogen, UA: 0.2 mg/dL (ref 0.0–1.0)

## 2010-08-04 LAB — GRAM STAIN

## 2010-08-04 LAB — COMPREHENSIVE METABOLIC PANEL
ALT: 18 U/L (ref 0–35)
AST: 17 U/L (ref 0–37)
CO2: 27 mEq/L (ref 19–32)
Calcium: 8.5 mg/dL (ref 8.4–10.5)
GFR calc Af Amer: >60 mL/min (ref >60)
Potassium: 3.9 mEq/L (ref 3.5–5.1)
Sodium: 132 mEq/L — ABNORMAL LOW (ref 135–145)
Total Protein: 6.7 g/dL (ref 6.0–8.3)

## 2010-08-04 LAB — VITAMIN B12: Vitamin B-12: 434 pg/mL (ref 211–911)

## 2010-08-04 LAB — URINE MICROSCOPIC-ADD ON

## 2010-08-04 LAB — MICROALBUMIN, URINE: Microalb, Ur: 38.54 mg/dL — ABNORMAL HIGH (ref 0.00–1.89)

## 2010-08-27 LAB — COMPREHENSIVE METABOLIC PANEL
ALT: 15 U/L (ref 0–35)
BUN: 10 mg/dL (ref 6–23)
CO2: 30 mEq/L (ref 19–32)
Calcium: 8.8 mg/dL (ref 8.4–10.5)
GFR calc non Af Amer: >60 mL/min (ref >60)
Glucose, Bld: 178 mg/dL — ABNORMAL HIGH (ref 70–99)
Sodium: 137 mEq/L (ref 135–145)
Total Protein: 7.1 g/dL (ref 6.0–8.3)

## 2010-08-27 LAB — GLUCOSE, CAPILLARY
Glucose-Capillary: 148 mg/dL — ABNORMAL HIGH (ref 70–99)
Glucose-Capillary: 149 mg/dL — ABNORMAL HIGH (ref 70–99)
Glucose-Capillary: 180 mg/dL — ABNORMAL HIGH (ref 70–99)
Glucose-Capillary: 198 mg/dL — ABNORMAL HIGH (ref 70–99)
Glucose-Capillary: 204 mg/dL — ABNORMAL HIGH (ref 70–99)
Glucose-Capillary: 214 mg/dL — ABNORMAL HIGH (ref 70–99)
Glucose-Capillary: 225 mg/dL — ABNORMAL HIGH (ref 70–99)
Glucose-Capillary: 244 mg/dL — ABNORMAL HIGH (ref 70–99)
Glucose-Capillary: 262 mg/dL — ABNORMAL HIGH (ref 70–99)
Glucose-Capillary: 274 mg/dL — ABNORMAL HIGH (ref 70–99)
Glucose-Capillary: 90 mg/dL (ref 70–99)

## 2010-08-27 LAB — CBC
HCT: 32.7 % — ABNORMAL LOW (ref 36.0–46.0)
Hemoglobin: 10.7 g/dL — ABNORMAL LOW (ref 12.0–15.0)
MCV: 81 fL (ref 78.0–100.0)
Platelets: 257 10*3/uL (ref 150–400)
Platelets: 308 10*3/uL (ref 150–400)
RDW: 15 % (ref 11.5–15.5)
WBC: 11.9 10*3/uL — ABNORMAL HIGH (ref 4.0–10.5)
WBC: 8.6 10*3/uL (ref 4.0–10.5)

## 2010-08-27 LAB — LIPID PANEL
Cholesterol: 179 mg/dL (ref 0–200)
HDL: 18 mg/dL — ABNORMAL LOW (ref >39)
LDL Cholesterol: 125 mg/dL — ABNORMAL HIGH (ref 0–99)
Total CHOL/HDL Ratio: 9.9 RATIO

## 2010-08-27 LAB — BASIC METABOLIC PANEL
BUN: 8 mg/dL (ref 6–23)
Creatinine, Ser: 0.68 mg/dL (ref 0.4–1.2)
GFR calc non Af Amer: >60 mL/min (ref >60)
Glucose, Bld: 189 mg/dL — ABNORMAL HIGH (ref 70–99)

## 2010-08-27 LAB — DIFFERENTIAL
Basophils Absolute: 0 10*3/uL (ref 0.0–0.1)
Eosinophils Absolute: 0.1 10*3/uL (ref 0.0–0.7)
Lymphocytes Relative: 25 % (ref 12–46)
Lymphs Abs: 2.9 10*3/uL (ref 0.7–4.0)
Neutrophils Relative %: 68 % (ref 43–77)

## 2010-08-27 LAB — BODY FLUID CULTURE

## 2010-08-27 LAB — ANAEROBIC CULTURE

## 2010-08-27 LAB — HEMOGLOBIN A1C: Hgb A1c MFr Bld: 12.9 % — ABNORMAL HIGH (ref 4.6–6.1)

## 2010-08-29 LAB — GLUCOSE, CAPILLARY
Glucose-Capillary: 136 mg/dL — ABNORMAL HIGH (ref 70–99)
Glucose-Capillary: 211 mg/dL — ABNORMAL HIGH (ref 70–99)
Glucose-Capillary: 232 mg/dL — ABNORMAL HIGH (ref 70–99)
Glucose-Capillary: 239 mg/dL — ABNORMAL HIGH (ref 70–99)
Glucose-Capillary: 296 mg/dL — ABNORMAL HIGH (ref 70–99)
Glucose-Capillary: 299 mg/dL — ABNORMAL HIGH (ref 70–99)
Glucose-Capillary: 392 mg/dL — ABNORMAL HIGH (ref 70–99)

## 2010-08-29 LAB — DIFFERENTIAL
Basophils Absolute: 0 10*3/uL (ref 0.0–0.1)
Basophils Absolute: 0.1 10*3/uL (ref 0.0–0.1)
Basophils Relative: 0 % (ref 0–1)
Eosinophils Absolute: 0.1 10*3/uL (ref 0.0–0.7)
Eosinophils Relative: 1 % (ref 0–5)
Lymphocytes Relative: 23 % (ref 12–46)
Monocytes Absolute: 0.7 10*3/uL (ref 0.1–1.0)
Monocytes Absolute: 1.2 10*3/uL — ABNORMAL HIGH (ref 0.1–1.0)
Neutro Abs: 10.5 10*3/uL — ABNORMAL HIGH (ref 1.7–7.7)

## 2010-08-29 LAB — URINALYSIS, ROUTINE W REFLEX MICROSCOPIC
Bilirubin Urine: NEGATIVE
Leukocytes, UA: NEGATIVE
Nitrite: NEGATIVE
Specific Gravity, Urine: <1.005 — ABNORMAL LOW (ref 1.005–1.030)
pH: 5.5 (ref 5.0–8.0)

## 2010-08-29 LAB — BASIC METABOLIC PANEL
BUN: 5 mg/dL — ABNORMAL LOW (ref 6–23)
Calcium: 8.2 mg/dL — ABNORMAL LOW (ref 8.4–10.5)
Creatinine, Ser: 0.57 mg/dL (ref 0.4–1.2)
GFR calc non Af Amer: >60 mL/min (ref >60)
Glucose, Bld: 293 mg/dL — ABNORMAL HIGH (ref 70–99)

## 2010-08-29 LAB — COMPREHENSIVE METABOLIC PANEL
ALT: 11 U/L (ref 0–35)
AST: 12 U/L (ref 0–37)
Albumin: 2.5 g/dL — ABNORMAL LOW (ref 3.5–5.2)
Albumin: 3 g/dL — ABNORMAL LOW (ref 3.5–5.2)
Alkaline Phosphatase: 69 U/L (ref 39–117)
BUN: 15 mg/dL (ref 6–23)
CO2: 26 mEq/L (ref 19–32)
Chloride: 102 mEq/L (ref 96–112)
Chloride: 97 mEq/L (ref 96–112)
Creatinine, Ser: 0.94 mg/dL (ref 0.4–1.2)
GFR calc Af Amer: >60 mL/min (ref >60)
Potassium: 3.8 mEq/L (ref 3.5–5.1)
Total Bilirubin: 0.6 mg/dL (ref 0.3–1.2)
Total Bilirubin: 1 mg/dL (ref 0.3–1.2)
Total Protein: 7.4 g/dL (ref 6.0–8.3)

## 2010-08-29 LAB — RETICULOCYTES: Retic Ct Pct: 1.4 % (ref 0.4–3.1)

## 2010-08-29 LAB — LIPID PANEL
HDL: 46 mg/dL (ref >39)
LDL Cholesterol: 116 mg/dL — ABNORMAL HIGH (ref 0–99)
Triglycerides: 64 mg/dL (ref <150)
VLDL: 13 mg/dL (ref 0–40)

## 2010-08-29 LAB — CULTURE, ROUTINE-ABSCESS

## 2010-08-29 LAB — CBC
HCT: 29.2 % — ABNORMAL LOW (ref 36.0–46.0)
HCT: 37.3 % (ref 36.0–46.0)
Hemoglobin: 9.9 g/dL — ABNORMAL LOW (ref 12.0–15.0)
MCHC: 33.9 g/dL (ref 30.0–36.0)
MCV: 81.9 fL (ref 78.0–100.0)
Platelets: 238 10*3/uL (ref 150–400)
Platelets: 200 10*3/uL (ref 150–400)
RBC: 3.96 MIL/uL (ref 3.87–5.11)
RDW: 13.8 % (ref 11.5–15.5)
RDW: 13.8 % (ref 11.5–15.5)
WBC: 8.8 10*3/uL (ref 4.0–10.5)

## 2010-08-29 LAB — WOUND CULTURE

## 2010-08-29 LAB — MAGNESIUM
Magnesium: 1.6 mg/dL (ref 1.5–2.5)
Magnesium: 2 mg/dL (ref 1.5–2.5)

## 2010-08-29 LAB — ANAEROBIC CULTURE: Gram Stain: NONE SEEN

## 2010-08-29 LAB — FOLATE: Folate: 18.2 ng/mL

## 2010-08-29 LAB — URINE MICROSCOPIC-ADD ON: WBC, UA: NONE SEEN WBC/hpf (ref <3)

## 2010-08-29 LAB — IRON AND TIBC

## 2010-08-29 LAB — PREGNANCY, URINE: Preg Test, Ur: NEGATIVE

## 2010-09-01 LAB — GC/CHLAMYDIA PROBE AMP, GENITAL
Chlamydia, DNA Probe: NEGATIVE
GC Probe Amp, Genital: NEGATIVE

## 2010-10-05 NOTE — Op Note (Signed)
[name] [name], [name] [name]          ACCOUNT NO.:  [id number]   MEDICAL RECORD NO.:  [id number]          PATIENT TYPE:  INP   LOCATION:  5502                         FACILITY:  [facility]   PHYSICIAN:  Dickey Gave, MDDATE OF BIRTH:  [birth year]   DATE OF PROCEDURE:  11/30/2008  DATE OF DISCHARGE:                               OPERATIVE REPORT   PREOPERATIVE DIAGNOSES:  1. Right buttock abscess.  2. Right inner thigh abscess.   POSTOPERATIVE DIAGNOSES:  1. Right buttock abscess.  2. Right inner thigh abscess.   PROCEDURE:  1. Incision and drainage of right buttock abscess.  2. Incision and drainage of right thigh abscess.   SURGEON:  Dickey Gave, MD   ANESTHESIA:  General.   SUPERVISING ANESTHESIOLOGIST:  Lorrene Reid, MD   SPECIMENS:  Cultures to pathology.   BLOOD LOSS:  Minimal.   COMPLICATIONS:  None.   DRAINS:  Packing to both wounds, left open.   DISPOSITION:  To recovery room in stable condition.   INDICATIONS:  [name] is a 35 year old female with uncontrolled  diabetes who was a smoker.  She had a prior history of what sounds like  MRSA abscesses.  She presents with a several week history of  intermittently draining right inner thigh and right buttock abscess that  we discussed before going to the operating room for exploration and  drainage.   PROCEDURE:  After informed consent obtained, the patient was taken to  the operating room.  She was on antibiotics on the floor.  She was  placed under general anesthesia without complication.  She was then  placed in a lithotomy position.  Surgical timeout was then performed.  The right inner thigh abscess was approached first.  An elliptical  incision was then made overlying this.  Dissection was carried out down  to what was a very chronic abscess cavity with return of purulent fluid.  This was cultured.  This wound was then packed and left open.  She was  then rolled into the left lateral decubitus  position and the  similar procedure was performed on the buttock wound.  This also was  very indurated, had some purulence as well to it with a very chronic  abscess cavity.  This was also packed open with Iodoform.  She tolerated  this well, extubated in the operating room, and transferred to recovery  room in stable condition.      Dickey Gave, MD  Electronically Signed     MCW/MEDQ  D:  11/30/2008  T:  12/01/2008  Job:  [id number]   cc:   Thomes Lolling, M.D.

## 2010-10-05 NOTE — Consult Note (Signed)
[name] [name], FLUD NO.:  [id number]   MEDICAL RECORD NO.:  [id number]          PATIENT TYPE:  INP   LOCATION:  5502                         FACILITY:  [facility]   PHYSICIAN:  Dickey Gave, MDDATE OF BIRTH:  [birth year]   DATE OF CONSULTATION:  11/30/2008  DATE OF DISCHARGE:                                 CONSULTATION   PRIMARY CARE PHYSICIAN:  Doreene Burke, MD   CONSULTATION:  Thomes Lolling, MD   CHIEF COMPLAINT:  Skin abscesses.   HISTORY OF PRESENT ILLNESS:  This is a 35 year old female with a history  of diabetes, hypertension who has prior history of recurrent skin  abscesses who presents with a 40-month history of a right inner thigh and  right buttock abscess that she says has intermittently been draining  purulence but have been increasing in size over this time.  There has  been no real relieving factors.  They have just been getting larger and  continued to drain.  She has had no fevers that she reports with this.  She also has a prior history of abdominal wall and a vulvar abscess that  have been incised and drained in the past before as well.  Her glucoses  have been out of control during this time as well.  She does complain  that these areas are painful.  She is admitted with a stable appetite  and increased thirst.   PAST MEDICAL HISTORY:  1. Hypertension.  2. Diabetes.  3. Recurrent skin abscesses.  4. By the computer history of bacterial endocarditis in 2002.  5. Depression.   PAST SURGICAL HISTORY:  1. Tonsillectomy.  2. I&D of an abdominal wall abscess.   MEDICATIONS:  None at home.  In the hospital now, she is on the  vancomycin, sliding scale insulin, subcutaneous heparin.   ALLERGIES:  She has no known drug allergies.   SOCIAL HISTORY:  Positive for smoking and occasional alcohol.  She works  in the [employer].  Denies any drug use.   FAMILY HISTORY:  Positive for diabetes mellitus.   PHYSICAL  EXAMINATION:  VITAL SIGNS:  Temperature is 98.5, heart rate is  100, pulse is 86, blood pressure 117/70, her respirations 24, and sats  are 100% on room air.  GENERAL:  She is a very obese female who looks disheveled, in no  apparent distress.  HEART:  Regular rate and rhythm.  LUNGS:  Clear bilaterally.  ABDOMEN:  Obese.  Healed I&D scar.  Nontender, nondistended on her right  inner thigh.  She has an indurated about 8 x 10 cm mass that is very  tender to palpation.  I do not feel any real fluctuance in this area but  she says this area has intermittently been draining through the head.  It is not currently draining right now.  Her right buttock shows an  indurated about 3 x 4 cm area that is tender with an ulcerated area that  is draining small amount of fluid.  EXTREMITIES:  No edema.   LABORATORY EVALUATION:  Her initial white blood count is 15.3, this is  now 8.8.  Her initial CBG was 597.  Her glucose this morning is 244.  BUN and creatinine are 8 and 0.61, potassium 3.8.  Her albumin is 2.5.  Liver function test is, otherwise, normal and her HCG is negative.   ASSESSMENT:  Recurrent skin abscesses.   PLAN:  1. We would continue vancomycin.  2. I think both of these areas would merit drainage as it has not      resolved over time and it should be explored to see if there is any      purulence present in there that will not allow these areas to heal.      She just ate when I saw her, made her n.p.o. and we will attempt to      do these later today.  If I am unable to do this later today due to      emergencies, then the plan would be for this to be done tomorrow.  3. Glucose control.  4. Smoking cessation.  5. Nutrition counseling given her chronic low albumin.      Dickey Gave, MD  Electronically Signed     MCW/MEDQ  D:  11/30/2008  T:  12/01/2008  Job:  [id number]

## 2010-10-05 NOTE — Group Therapy Note (Signed)
[name] [name], [name] [name] NO.:  [id number]   MEDICAL RECORD NO.:  [id number]          PATIENT TYPE:  WOC   LOCATION:  [facility]                   FACILITY:  [facility]   PHYSICIAN:  Andrew Au, MD        DATE OF BIRTH:  1976/05/22   DATE OF SERVICE:  02/06/2008                                  CLINIC NOTE   The patient is a 35 year old, nulligravida, African American female who  was referred by the medical clinic at [facility] for an  atypical Pap smear LSIL. The patient has hypertension, diabetes  mellitus,GERD, and hyperlipidemia.  She is currently taking Lantus,  metformin and NyQuil and Alka Seltzer, but she is not taking all the  medications that they have prescribed because she said she cannot afford  them.  I have asked her to call and go back and see if they cannot re  prescribe some of them that she is able to get through one of the  pharmacy programs around town.   PHYSICAL EXAMINATION:  The patient weighs 312 pounds today, her blood  pressure is 110/74.  Colposcopic examination was done using the Graves speculum. The cervix  was centered in the center of the field, 3% acetic acid was used, the  entire transformation zone was for this patient was quite easily  visualized.  Endocervical curettage was taken. There were significant  acetowhite areas around the cervix without punctations, without  mosaicism, and without atypical vessels. The transformation zone was  seen 360 degrees.  Several biopsies were taken from the areas where this  highest and longest lasting acetowhite areas existed, at 1 o'clock and  at 6 o'clock. The patient then had Monsel's solution applied to control  the bleeding.  There was no bleeding at the end of the procedure, the  speculum was removed.  The patient will return in 2 weeks for results.   IMPRESSION:  Probably CIN1.           ______________________________  Andrew Au, MD     PR/MEDQ  D:  02/06/2008  T:   02/07/2008  Job:  [id number]

## 2010-10-05 NOTE — Op Note (Signed)
[name], [name] [name] NO.:  [id number]   MEDICAL RECORD NO.:  [id number]          PATIENT TYPE:   LOCATION:                                 FACILITY:   PHYSICIAN:  Jonnie Kind, M.D.      DATE OF BIRTH:   DATE OF PROCEDURE:  DATE OF DISCHARGE:                               OPERATIVE REPORT   PREOPERATIVE DIAGNOSIS:  Vulvar abscess.   POSTOPERATIVE DIAGNOSIS:  Vulvar abscess.   PROCEDURE:  Incision and drainage of superficial abscess of mons pubis.   SURGEON:  Jonnie Kind, M.D.   ASSISTANT:  None.   ANESTHESIA:  Local.   COMPLICATIONS:  None.   FINDINGS:  A thick brawny fibrotic subcutaneous tissue from long-  standing chronic infection.  Two small pockets of sebaceous material  cleaned out bluntly, no evidence of deep infection more than 1.5 cm skin  surface.   INDICATIONS FOR PROCEDURE:  The patient was sent to the [facility] from [facility] due to a thick brawny 5 cm area on the left side of the mons  pubis.  She had been on Doxycycline and Septra in the past with  suspected MRSA with the patient not sure if she has ever been cultured  positive for this organism.  She has had this wound for at least two  weeks with a relatively small amount of discomfort.  She has had a life  long history of hidradenitis of the mons pubis area.   DETAILS OF PROCEDURE:  After consent was obtained and the patient was  given IV Dilaudid x1 mg for her anxiety and relaxation, the left half of  the mons pubis was prepped with Betadine solution and approximately 15  mL of 1% Xylocaine injected in a field block circumferentially around  the lesion in the mons pubis on the left side.  These two fingernail  size areas were then opened with a stab incision to a depth of  approximately 1.5 cm and lateral pressure allowed expulsion of some  thick, firm sebaceous material.  There was no frank abscess.  There was  a cleavage plane that developed just beneath the skin  that allowed  connection of the two sites.  The area over it was completely open until  there was one continuous long incision approximately 3 cm in length  connecting the two sebaceous cysts.  The area between them was debrided  manually with extraction of fibrotic sebaceous cyst wall as much as  possible using Kelly clamps and Metzenbaum scissors.  Palpation around  the area showed that there was no suspicion of adjacent abscess and  there was no deep pocket suspected.  The entire superficial mons pubis  tissue was quite mobile due to patient obesity.  Once the wound had been  thoroughly mechanically debrided, Iodoform gauze was placed in the  depths of the incision with the patient given instructions to leave it  in for 36 hours and then remove it.  The patient will use topical triple  antibiotic daily, will shower p.r.n. after Friday, and will receive a  prescription for Keflex 500 mg p.o. q.i.d.  to take with her Septra which  she already has at home.  [facility]  The patient was given instructions on wound care and also a  prescription for Vicodin.      Jonnie Kind, M.D.  Electronically Signed     JVF/MEDQ  D:  08/29/2007  T:  08/30/2007  Job:  [id number]

## 2010-10-08 NOTE — Discharge Summary (Signed)
[place]  Patient:    [name] [name], [name] [name]                 MRN: [id number] Adm. Date:  [id number] Disc. Date: [id number] Attending:  Campbell Riches Dictator:   Shelia Media, M.D. CC:         Birdie Riddle, M.D.  Izola Price. Benay Spice, M.D.  [place]. [facility]   Discharge Summary  DISCHARGE DIAGNOSES: 1. 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 2. Anemia secondary to iron deficiency, as well as chronic disease. 3. Type 2 diabetes.  DISCHARGE MEDICATIONS: 1. Ceftriaxone 2 g IV q.d. x 3 more weeks for a total of six weeks of    antibiotics. 2. Insulin NPH 35 units subcu q.a.m. and 10 units subcu q.h.s. 3. Glucophage 500 mg p.o. b.i.d. 4. Fentanyl patch 25 mcg/hr, change patch q.72h. 5. Percocet 5/325 mg one to two p.o. q.6h. p.r.n. pain. 6. Ferrous gluconate 300 mg one p.o. t.i.d. 7. Phenergan 12.5-25 mg p.o. q.6h. p.r.n. nausea. 8. Colace 100-200 mg p.o. q.d. p.r.n. constipation. 9. Amoxicillin 2 g p.o. one hour prior to any dental procedures.  PROCEDURES: 1. CT of chest on May 30, 2000, which revealed bilateral cavitary lung    lesions and associated lymphadenopathy. 2. Transesophageal echocardiogram dated June 05, 2000, which revealed thick    tricuspid valve with multiple mobile strands, small mobile strand on the    mitral valve, normal left ventricular systolic function, ejection    fraction 60%, and patent foramen ovale with a right-to-left shunt only on    cough maneuver. 3. Peripherally inserted central catheter placement on June 12, 2000. 4. Repeat CT of the chest dated June 10, 2000, which revealed an increase    in number of focal cavitary lesions and bilateral pneumonia. 5. CT of the abdomen dated June 15, 2000, which was negative for hematoma    or hemorrhage.  CONSULTATIONS: 1. Birdie Riddle,  M.D., regarding transesophageal echocardiogram. 2. Izola Price. Benay Spice, M.D., with hematology/oncology for work up of her anemia.  HISTORY OF PRESENT ILLNESS:  The patient is a 35 year old African-American female with poorly controlled type 2 diabetes, a history of a neck abscess, as well as an abdominal wall abscess, in 1998, who presents with a chief complaint of orthopnea accompanied by vague chest discomfort and a productive cough with thick white sputum.  She has also been having night sweats, chills, and vomiting since December of 2001, although she has not really felt well for over six months.  She has had some chills and "flu-like" symptoms for the last several months.  She has no known sick contracts, no TB exposures, no recent travel outside [place], no hemoptysis, and no recent antibiotics. Denies any cocaine, alcohol, or IV drug use.  She does feel overall rundown and gets quite dyspnea on exertion for the last two to three months.  PAST MEDICAL HISTORY:  1. Significant for type 1-1/2 diabetes diagnosed in 1998.  2. Status post tonsillectomy at age 42.  39. Abdominal wall abscess drained by Haywood Lasso, M.D., in 1998 which was infected with Streptococcus agalactiae.  4. History of microcytic anemia.  5. Obesity with 140 pound weight loss since being diagnosed with diabetes.  ALLERGIES:  She is not allergic to any medicine.  ADMISSION MEDICATIONS: 1. Insulin NPH 30 units subcu q.a.m. and 15 units subcu q.p.m. 2. Glucophage  500 mg p.o. q.d. 3. She takes TheraFlu, Tylenol, or NyQuil p.r.n.  FAMILY HISTORY:  Significant for mother with diabetes and maternal grandfather who had coronary artery disease.  SOCIAL HISTORY:  She lives in [place], [place], with her mother. She works for a [employer].  She uses occasional marijuana.  She used to smoke half of a pack from March of 2001 to November of 2001, but quit this when she started feeling short of  breath.  She denies any cocaine, alcohol, or IV drug use.  REVIEW OF SYSTEMS:  Positive for the weight loss of 140 pounds, which she relates to watching what she eats since her diagnosis of diabetes in 1998, fevers, and chills.  Otherwise noncontributory.  PHYSICAL EXAMINATION:  Her temperature was 101.3 degrees, her BP was 113/76, her heart rate was 125, respirations 28, and O2 saturation 96% on room air.  GENERAL APPEARANCE:  She is an obese African-American female in mild respiratory distress.  HEENT:  Conjunctivae are clear bilaterally.  The oropharynx is dry, but clear without exudates.  LYMPHATICS:  There is no cervical or supraclavicular lymphadenopathy.  NECK:  No thyromegaly.  The neck is supple.  CARDIOVASCULAR:  Tachycardic, but regular with no murmurs, rubs, or gallops.  LUNGS:  Good air movement bilaterally.  Clear to auscultation.  ABDOMEN:  Positive bowel sounds.  Soft, nontender, nondistended, and obese.  EXTREMITIES:  Warm and dry without clubbing, cyanosis, or edema.  SKIN:  No rash or abscess.  No splinter hemorrhages.  NEUROLOGIC:  Awake and oriented x 3.  Nonfocal.  ADMISSION LABORATORY DATA:  White count of 9.8, hemoglobin 11.9, platelets 136, and ANC 2.5.  Sodium 131, potassium 3.7, chloride 96, bicarbonate 23, BUN 9, creatinine 1.3, glucose 276.  The chest x-ray is unremarkable, but the chest CT on admission reveals the above finding of cavitary lung lesions bilaterally.  The EKG reveals sinus tachycardia with a rate of 133 with normal intervals and axis and no ST-T wave changes.  HOSPITAL COURSE: #1 - SUBACUTE BACTERIAL ENDOCARDITIS:  The patient was admitted for respiratory isolation with a concern of TB, however, her PPD was negative and her sputum was negative for AFB x 2.  Blood cultures were taken at the time of  admission and by hospital day #2 had turned positive secondary to group B streptococcus, which was identified as Streptococcus  agalactiae.  A new heart murmur was actually noted on hospital day #2.  This was missed on admission, but was highly suspicious for endocarditis.  A cardiology consultation was obtained from Birdie Riddle, M.D.  A TEE was performed on June 05, 2000, which revealed the above vegetation.  She was started on a prolonged antibiotic course which originally was Ancef and gentamicin for five days. The Ancef was continued, however, the patient developed recurrent fevers after discontinuation of the short course of gentamicin for synergy.  We changed her coverage to Rocephin for ease of administration, as well as 14 more days of gentamicin for synergy.  During these 14 days, the patient experienced a decrease in her fevers and was actually afebrile for close to two weeks prior to discharge.  She did receive her full 14 days synergistic gentamicin and will be discharged home for three more weeks of ceftriaxone which will give her a grand total of six weeks of antibiotics, including the initial Ancef. As I mentioned, antibiotics were changed to Rocephin for q.d. dosing.  A chest CT was repeated on June 10, 2000,  secondary to her recurrent fevers after discontinuation of the gentamicin.  We found basically what we expected, which was an increase in the number of focal cavitary lesions and some confluence of these lesions and resultant bilateral pneumonia.  Her respiratory status remained stable throughout her hospitalization.  Prior to discharge, she was ambulating without any dyspnea and was feeling much better.  A PICC line was placed on June 12, 2000, for completion of her six weeks of antibiotics. The plan is to repeat TEE near the end of her six weeks.  Dr. Cristy Folks office is going to contact the patient about this.  If there is persistence of vegetations, surgery may need to be considered at that time versus more antibiotics.  We did not feel that surgery was necessary  secondary to first of all the lesions being on the right side of the heart without significant hemodynamic compromise.  She did have moderate tricuspid regurgitation, but the mitral valve with the small vegetation was competent and without any regurgitation.  There were no paravalvular abscesses or leaks. In the work-up of these cavitary lung lesions, we did check an HIV antibody which was negative and an ANA which was also negative.  #2 - SEVERE MICROCYTIC ANEMIA:  As I mentioned above, her admission hemoglobin was 11.9.  This dropped to about 9.5-10.0 status post hydration.  However, over her hospital course, her hemoglobins consistently drifted down to a nadir of 6.8, but stabilized around 7.2 prior to discharge.  She had an extensive work-up for this anemia, including negative fecal occult blood tests x 3 and an abdominal CT which was negative for hemorrhage or retroperitoneal bleeding. Her B12 and folate were both within normal limits.  Her ferritin was within normal limits at 475.  We suspect this may be falsely elevated secondary to her acute infection.  A reticulocyte count was only within normal limits at 47.9 absolute and 1.7%.  However, reviewing her peripheral smear reveals severe hypochromasia consistent with iron deficiency anemia.  We obtained a hematology/oncology consult to help [place] with this anemia and they agreed that this was most likely iron deficiency combined with chronic disease and bone marrow suppression.  We did check and erythropoietin level, which was appropriately elevated at 77 and we undertook vigorous iron replacement. Unfortunately she were not able to tolerate iron sulfate as this made her nauseous and constipated.  We did change this to iron gluconate, although she was only able to tolerate about half of the dose that she would need to replace her iron adequately.  We had pharmacy help [place] with an IV iron dose which she received one time on June 19, 2000, which should replace her iron deficiency completely and she will maintain on her low-dose iron gluconate as above.  Will follow up her anemia in the clinic.  As I mentioned, her discharge hemoglobin was 7.2.  #3 - DIABETES, TYPE 1-1/2:  We adjusted her insulin and increased her Glucophage as above with good control of her capillary blood glucose in the hospital.  I suspect that when she gets back to her regular diet, we may need to adjust this further and we can follow this up in clinic.  DISPOSITION:  To home with IV therapy x 3 more weeks.  CONDITION ON DISCHARGE:  Stable.  FOLLOW-UP:  Will be with me in the internal medicine outpatient clinic, as well as with Birdie Riddle, M.D., for repeat TEE in about three weeks. Dr. [place] office will  call the patient with that appointment. DD:  06/27/00 TD:  06/28/00 Job: [id number]

## 2010-10-08 NOTE — Discharge Summary (Signed)
[name], [name]          ACCOUNT NO.:  [id number]   MEDICAL RECORD NO.:  [id number]          PATIENT TYPE:  INP   LOCATION:  5502                         FACILITY:  [facility]   PHYSICIAN:  Thomes Lolling, M.D.    DATE OF BIRTH:  20-Jan-1976   DATE OF ADMISSION:  11/29/2008  DATE OF DISCHARGE:  12/02/2008                               DISCHARGE SUMMARY   DISCHARGE DIAGNOSES:  1. Multiple skin abscesses drained and treated with antibiotics.  2. Chronic cough.  3. Type 2 diabetes.  4. Candida vaginitis.  5. Hyperlipidemia.  6. Tobacco abuse.   DISCHARGE MEDICATIONS:  1. Metformin 1000 mg tab take 1 tab twice daily.  2. Pravastatin 40 mg p.o. daily.  3. Glipizide 10 mg p.o. twice daily.  4. Augmentin 875/125 mg p.o. twice daily for 7 days.  5. Vicodin 5/500 mg p.o. every 6 hours as needed for pain.  6. Famotidine 20 mg p.o. twice daily.   DISPOSITION AND FOLLOWUP:  The patient was discharged to home in stable  condition with appropriate followup with Dr. Tyrell Antonio in the [facility] on August 4, at 2 p.m.  The patient was also  instructed to call the office of Dr. Donne Hazel with Surgery to make a  followup appointment within 2 weeks.  At the followup with Dr. Tyrell Antonio,  doctor should examine the patient's 2 abscess sites and evaluate for  appropriate healing.  Dr. Tyrell Antonio should also look over the patient's  glucose log and alter her diabetes regimen as necessary.  Dr. Tyrell Antonio  should also address her smoking.   PROCEDURES:  On July 11, the patient underwent incision and drainage of  the right buttock abscess as well as incision and drainage of the right  thigh abscess with Dr. Donne Hazel.   CONSULTATION:  Dr. Donne Hazel with General Surgery.   BRIEF ADMITTING HISTORY AND PHYSICAL:  The patient is a 35 year old  female with past medical history significant for diabetes, hypertension,  history of recurrent skin abscesses who was transferred to [facility]  from [facility] with complaints of 2 draining boils, one on  her right buttock and the second on her right inner thigh as well as  fevers and chills.  She reported that she had had the boils for about 1  months' time.  She reported associated subjective fevers and chills as  well as hyperglycemia over that time period.  She described that the  lesion on her buttock had been draining yellow fluid for 1 week and the  one on her leg for the past 3 days.  Lesions are painful.  She has had  hyperglycemia in the 400s over this time period with increased thirst,  but stable appetite.  She also had a chronic cough.   PAST MEDICAL HISTORY:  1. Diabetes diagnosed 11 years ago without treatment for 1 year.  2. Hypertension untreated.  3. Depression, used to take Prozac at age 72.  47. Anxiety.   PHYSICAL EXAMINATION:  VITAL SIGNS:  At admission, temperature 101.5 at  outside hospital, 99.8 at [facility], pulse 113, later 90, blood  pressure 122/91,  respirations 20, and O2 sats 99% on room air.  GENERAL:  The patient was alert, obese female, cooperative to the exam.  LUNGS:  Clear to auscultation bilaterally without crackles or wheezes.  HEART:  Normal rate.  Regular rhythm.  No murmurs, rubs, or gallops.  SKIN:  On the right buttock, there are 2 areas of induration, one that  was 3 x 3 cm with a healing head, the other was 7 x 7 cm with a central  ulceration with yellow drainage that was nonfluctuant.  On the right  inner thigh, there is an area with 2 indurated sections, the smaller of  which is 2 x 2 cm with an ulcerated head and the other is immediately  adjacent and about 10 x 5 cm, also nonfluctuant without erythema.   ADMISSION LABORATORY DATA:  CBG 597, white blood cells 15.3, and with an  ANC of 10.5, hemoglobin 12.5, MCV 82, hematocrit 37.3, platelets 200.  UA showed glucose over 1000, no white blood cells, otherwise within  normal limits.   HOSPITAL COURSE:  1. Multiple  abscesses.  The patient was admitted with multiple      abscesses on her right buttock and right thigh with associated      systemic signs and symptoms including fever and leukocytosis.  The      patient was started initially on IV vancomycin.  Cultures were      taken prior to antibiotic administration.  The results of these      cultures came back and showed group B strep as the causative agent.      On hospital day 2, the patient underwent I&D of both of those      abscesses with packing by Surgery.  The patient remained afebrile      after her day of admission and also her leukocytosis went down.      The patient's antibiotic regimen was changed.  Once the culture      results came back, she was started on Augmentin.  The patient still      had packing and was discharged to home with home help for dressing      changes.  2. Chronic cough.  The patient was a smoker and was complaining of a      chronic cough with some associated musculoskeletal chest pain with      coughing.  This remained stable throughout her hospitalization.  On      the day of discharge, she was ambulated with O2 sat monitoring,      which stayed well above 90%.  3. Diabetes.  The patient had been without antidiabetic therapy for 1      year due to financial stressors.  She had an A1c of 14 on      admission.  She had CBG of 597 at admission.  The patient was      initially started on sliding scale insulin, which was changed to 2      oral hypoglycemics, metformin and glipizide by hospital day 2.  The      patient was discharged on these medicines, and told to follow up      and bring a glucose log to her followup visit.  4. Candida vaginitis.  The patient developed a candida vaginal      infection on hospital day 2 after starting vancomycin which was      treated with a one-time dose of fluconazole.  5. Hyperlipidemia.  The patient was  started on pravastatin for her      elevated lipids.  6. Tobacco abuse.  The  patient was counseled about stopping tobacco      use.   DISCHARGE VITALS:  Temperature 98.1, pulse 91, respirations 20, systolic  blood pressure 123456, diastolic 96, and O2 sats 0000000 on room air.   DISCHARGE LABORATORY DATA:  Urine pregnancy test negative.      Doreene Burke, MD  Electronically Signed      Thomes Lolling, M.D.  Electronically Signed    KS/MEDQ  D:  12/04/2008  T:  12/05/2008  Job:  [id number]   cc:   Niel Hummer, MD  Dickey Gave, MD

## 2010-10-08 NOTE — Consult Note (Signed)
[place]  Patient:    [name] [name], [name] [name]                 MRN: [id number] Proc. Date: 06/16/00 Adm. Date:  [id number] Attending:  De Burrs Dictator:   Ned Card, N.P. CC:         Doroteo Bradford. Johnnye Sima, M.D. with Family Practice   Consultation Report  DATE OF BIRTH:  [birth year]  REFERRING PHYSICIAN:  Doroteo Bradford. Johnnye Sima, M.D.  REASON FOR CONSULTATION:  Anemia.  HISTORY OF PRESENT ILLNESS:  The patient is a 35 year old female with poorly controlled diabetes mellitus diagnosed in 1998, who was admitted with fever, chills, and sweats as well as shortness of breath and chest pain on May 31, 2000, at which time she was found by CT scan of the chest to have bilateral cavity pulmonary nodules with associated adenopathy.  She was also noted to have a new systolic murmur.  Blood cultures were obtained; they returned positive for GBS (S. agalactiae).  A TEE was done on June 05, 2000, with findings of vegetation of the TV and a patent foramen ovale.  The patient is currently receiving antibiotic treatment with gentamicin and Rocephin.  She was noted to have a mild microcytic anemia on admission with a hemoglobin of 11.9 and MCV of 77.9.  Her white blood cell count at that time was 9.8.  Her platelet count was 136,000.  Her hemoglobin has declined since admission to a low of 6.8 on June 09, 2000, and she is status post transfusion of 2 units of packed red cells on June 10, 2000. Her hemoglobin on June 15, 2000, was 6.9.  A hematology consult has been requested for evaluation of anemia.  PAST MEDICAL HISTORY: 1. Diabetes mellitus, poorly controlled; diagnosed in 1998. 2. History of abdominal wall abscess, status post I&D in 1998. 3. History of neck abscess, status post I&D in September 2001. 4. Obesity. 5. The patient reports a history of "low iron" and states that she took iron    in the past but was not able to  continue this secondary to GI symptoms.    She reports this was when she was in high school.  CURRENT MEDICATIONS: 1. Rocephin 2 g IV q.24h. 2. Colace 200 mg daily. 3. Duragesic 50 mcg transdermal q.72h. 4. Ferrous sulfate 325 mg b.i.d. 5. NPH insulin. 6. Metformin 500 mg b.i.d.  ALLERGIES:  No known drug allergies.  FAMILY HISTORY:  Mother, aged 51, has diabetes mellitus.  The patient has no contact with her father.  She has one sister, aged 3, who is healthy except for obesity.  She has no family history of hematological problems.  She also reports one half brother and sister who are both healthy.  SOCIAL HISTORY:  The patient lives in [place] with her mother.  She is single.  She has no children.  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  She reports a positive tobacco history, stating she quit smoking altogether in October 2001 at which time she was smoking four cigarettes per day.  She reports "social" ETOH use.  She does report very infrequent use of marijuana.  GYNECOLOGIC HISTORY:  The patient reports irregular menstrual cycles.  She reports her last cycle to be in November 2001.  The bleeding usually lasts four days with heavy bleeding on day #1.  REVIEW OF SYSTEMS:  The patient reports a significant weight loss since her diagnosis of diabetes  in 1998.  She reports a diminished appetite prior to this admission.  She did have fever, chills, and sweats at home.  She also was experiencing some chest pain.  She has had some occasional blurred vision. She also reports occasional "cold sores."  She denies any neck pain but does report a history of a neck abscess that was drained in September 2001.  She reports shortness of breath as well as a cough prior to admission.  She denies any hemoptysis prior to admission but does report she is experiencing some at present.  She denies any swelling in her legs.  She has had no recent change in her bowel  habits and no rectal bleeding or melena.  She denies any hematuria or dysuria.  PHYSICAL EXAMINATION:  VITAL SIGNS:  Temperature 98.8, heart rate 94, respirations 20, and blood pressure 114/72.  GENERAL:  Obese African-American female in no acute distress.  HEENT:  Normocephalic and atraumatic.  Pupils are equal, round, and reactive to light.  Extraocular movements are intact.  Sclerae are anicteric.  Mouth is clear.  LYMPH:  No palpable lymph nodes.  LUNGS:  Faint rhonchi; productive cough with light brown sputum expectorated.  CARDIOVASCULAR:  Regular rate and rhythm with a 2/6 systolic ejection murmur.  ABDOMEN:  Large, soft, and nontender.  Bowel sounds are active throughout.  No hepatosplenomegaly palpated.  EXTREMITIES:  Trace right pretibial edema.  NEUROLOGIC:  Nonfocal.  LABORATORY DATA:  On June 15, 2000, hemoglobin was 6.9, white count 8.0, platelets 524,000, and MCV 83.4.  Reticulocyte count on June 08, 2000, of 1.7%, RBC 2.82, and absolute reticulocyte 47.9.  Sodium 134, potassium 4.0, BUN 8, creatinine 0.7, and glucose 118.  B12 was 931, folate 10.2, ferritin 588,475, and an iron of less than 10.  Peripheral smear:  #1 Rbcs - microcytic, hypochromic, moderately low; rare nucleated rare blood cells; no schistocytes.  #2 - Wbcs unremarkable.  CT scan of the chest on June 10, 2000:  Progression of air space infiltrates, right upper and lower lobes; increased number of focal cavitary lesions, especially in the left lower lobe; hepatosplenomegaly noted.  IMPRESSION AND PLAN:  The patient is a 35 year old admitted with a strep endocarditis and microcytic anemia.  The anemia is multifactorial, including iron deficiency, anemia secondary to acute/chronic inflammation, and questionably related to renal insufficiency and polyphlebotomy.  The blood smear was reviewed by Dr. Benay Spice and is most suggestive of iron deficiency (the patient gives a history of iron  deficiency in the past).  We suspect the  anemia is in large part due to this.  However, the anemia of chronic disease is often microcytic and can be this severe.  There is no evidence for significant hemolysis.  RECOMMENDATIONS: 1. Continue ferrous sulfate and increase to 325 mg q.i.d. as tolerated. 2. Check an EPO level - should be elevated. 3. Transfuse for symptoms. 4. Limit blood draws. 5. We will continue to follow the patient while in the hospital and as an    outpatient if needed. DD:  06/16/00 TD:  06/16/00 Job: [id number]

## 2011-02-15 LAB — CBC
MCHC: 33.6
MCV: 80.7
RDW: 13.4

## 2011-02-15 LAB — CULTURE, ROUTINE-ABSCESS: Gram Stain: NONE SEEN

## 2011-02-15 LAB — WOUND CULTURE: Gram Stain: NONE SEEN

## 2011-04-11 ENCOUNTER — Encounter (HOSPITAL_COMMUNITY): Payer: Self-pay | Admitting: Emergency Medicine

## 2011-04-11 ENCOUNTER — Emergency Department (HOSPITAL_COMMUNITY): Payer: Self-pay

## 2011-04-11 ENCOUNTER — Inpatient Hospital Stay (HOSPITAL_COMMUNITY)
Admission: EM | Admit: 2011-04-11 | Discharge: 2011-04-19 | DRG: 603 | Disposition: A | Discharge status: Home / Self Care | Payer: Self-pay | Attending: Infectious Diseases | Admitting: Infectious Diseases

## 2011-04-11 DIAGNOSIS — Z794 Long term (current) use of insulin: Secondary | ICD-10-CM

## 2011-04-11 DIAGNOSIS — E119 Type 2 diabetes mellitus without complications: Secondary | ICD-10-CM

## 2011-04-11 DIAGNOSIS — R739 Hyperglycemia, unspecified: Secondary | ICD-10-CM

## 2011-04-11 DIAGNOSIS — Z8614 Personal history of Methicillin resistant Staphylococcus aureus infection: Secondary | ICD-10-CM

## 2011-04-11 DIAGNOSIS — F39 Unspecified mood [affective] disorder: Secondary | ICD-10-CM | POA: Diagnosis present

## 2011-04-11 DIAGNOSIS — Z6841 Body Mass Index (BMI) 40.0 and over, adult: Secondary | ICD-10-CM

## 2011-04-11 DIAGNOSIS — R51 Headache: Secondary | ICD-10-CM | POA: Diagnosis not present

## 2011-04-11 DIAGNOSIS — L0390 Cellulitis, unspecified: Secondary | ICD-10-CM

## 2011-04-11 DIAGNOSIS — K219 Gastro-esophageal reflux disease without esophagitis: Secondary | ICD-10-CM | POA: Diagnosis present

## 2011-04-11 DIAGNOSIS — L089 Local infection of the skin and subcutaneous tissue, unspecified: Secondary | ICD-10-CM | POA: Diagnosis present

## 2011-04-11 DIAGNOSIS — Z91199 Patient's noncompliance with other medical treatment and regimen due to unspecified reason: Secondary | ICD-10-CM

## 2011-04-11 DIAGNOSIS — L0201 Cutaneous abscess of face: Principal | ICD-10-CM

## 2011-04-11 DIAGNOSIS — E785 Hyperlipidemia, unspecified: Secondary | ICD-10-CM | POA: Diagnosis present

## 2011-04-11 DIAGNOSIS — L0291 Cutaneous abscess, unspecified: Secondary | ICD-10-CM

## 2011-04-11 DIAGNOSIS — Z9119 Patient's noncompliance with other medical treatment and regimen: Secondary | ICD-10-CM

## 2011-04-11 DIAGNOSIS — Z9114 Patient's other noncompliance with medication regimen: Secondary | ICD-10-CM

## 2011-04-11 DIAGNOSIS — E1122 Type 2 diabetes mellitus with diabetic chronic kidney disease: Secondary | ICD-10-CM | POA: Diagnosis present

## 2011-04-11 DIAGNOSIS — Z91148 Patient's other noncompliance with medication regimen for other reason: Secondary | ICD-10-CM

## 2011-04-11 DIAGNOSIS — B379 Candidiasis, unspecified: Secondary | ICD-10-CM | POA: Diagnosis not present

## 2011-04-11 DIAGNOSIS — L03211 Cellulitis of face: Principal | ICD-10-CM | POA: Diagnosis present

## 2011-04-11 DIAGNOSIS — Z79899 Other long term (current) drug therapy: Secondary | ICD-10-CM

## 2011-04-11 DIAGNOSIS — E669 Obesity, unspecified: Secondary | ICD-10-CM | POA: Diagnosis present

## 2011-04-11 DIAGNOSIS — I10 Essential (primary) hypertension: Secondary | ICD-10-CM

## 2011-04-11 DIAGNOSIS — E1159 Type 2 diabetes mellitus with other circulatory complications: Secondary | ICD-10-CM | POA: Diagnosis present

## 2011-04-11 LAB — POCT I-STAT, CHEM 8
Calcium, Ion: 1.19 mmol/L (ref 1.12–1.32)
HCT: 40 % (ref 36.0–46.0)
Hemoglobin: 13.6 g/dL (ref 12.0–15.0)
TCO2: 27 mmol/L (ref 0–100)

## 2011-04-11 LAB — GLUCOSE, CAPILLARY
Glucose-Capillary: 350 mg/dL — ABNORMAL HIGH (ref 70–99)
Glucose-Capillary: 311 mg/dL — ABNORMAL HIGH (ref 70–99)
Glucose-Capillary: 263 mg/dL — ABNORMAL HIGH (ref 70–99)

## 2011-04-11 LAB — URINE MICROSCOPIC-ADD ON

## 2011-04-11 LAB — MRSA PCR SCREENING: MRSA by PCR: NEGATIVE

## 2011-04-11 LAB — BASIC METABOLIC PANEL
BUN: 13 mg/dL (ref 6–23)
Chloride: 98 mEq/L (ref 96–112)
GFR calc Af Amer: >90 mL/min (ref >90)
Potassium: 4.7 mEq/L (ref 3.5–5.1)
Sodium: 133 mEq/L — ABNORMAL LOW (ref 135–145)

## 2011-04-11 LAB — URINALYSIS, ROUTINE W REFLEX MICROSCOPIC
Nitrite: NEGATIVE
Specific Gravity, Urine: 1.031 — ABNORMAL HIGH (ref 1.005–1.030)
Urobilinogen, UA: 0.2 mg/dL (ref 0.0–1.0)

## 2011-04-11 LAB — CBC
HCT: 35.3 % — ABNORMAL LOW (ref 36.0–46.0)
Hemoglobin: 13 g/dL (ref 12.0–15.0)
MCHC: 35.4 g/dL (ref 30.0–36.0)
RBC: 4.72 MIL/uL (ref 3.87–5.11)
RDW: 12.5 % (ref 11.5–15.5)
WBC: 9.3 10*3/uL (ref 4.0–10.5)

## 2011-04-11 LAB — DIFFERENTIAL
Basophils Relative: 0 % (ref 0–1)
Lymphs Abs: 1.7 10*3/uL (ref 0.7–4.0)
Monocytes Relative: 7 % (ref 3–12)
Neutro Abs: 6.3 10*3/uL (ref 1.7–7.7)
Neutrophils Relative %: 72 % (ref 43–77)

## 2011-04-11 LAB — CREATININE, SERUM
GFR calc Af Amer: >90 mL/min (ref >90)
GFR calc non Af Amer: >90 mL/min (ref >90)

## 2011-04-11 LAB — CARDIAC PANEL(CRET KIN+CKTOT+MB+TROPI)
Relative Index: 1.6 (ref 0.0–2.5)
Total CK: 128 U/L (ref 7–177)
Troponin I: <0.3 ng/mL (ref <0.30)

## 2011-04-11 LAB — PREGNANCY, URINE: Preg Test, Ur: NEGATIVE

## 2011-04-11 LAB — RAPID URINE DRUG SCREEN, HOSP PERFORMED: Benzodiazepines: NOT DETECTED

## 2011-04-11 MED ORDER — PIPERACILLIN-TAZOBACTAM 3.375 G IVPB
3.3750 g | Freq: Three times a day (TID) | INTRAVENOUS | Status: DC
  Administered 2011-04-11: 3.375 g via INTRAVENOUS
  Filled 2011-04-11 (×2): qty 50

## 2011-04-11 MED ORDER — ENOXAPARIN SODIUM 40 MG/0.4ML ~~LOC~~ SOLN
40.0000 mg | SUBCUTANEOUS | Status: DC
  Administered 2011-04-11 – 2011-04-18 (×8): 40 mg via SUBCUTANEOUS
  Filled 2011-04-11 (×10): qty 0.4

## 2011-04-11 MED ORDER — INSULIN ASPART 100 UNIT/ML ~~LOC~~ SOLN
0.0000 [IU] | SUBCUTANEOUS | Status: DC
  Administered 2011-04-11: 11 [IU] via SUBCUTANEOUS
  Administered 2011-04-12: 3 [IU] via SUBCUTANEOUS
  Administered 2011-04-12: 5 [IU] via SUBCUTANEOUS
  Administered 2011-04-12: 11 [IU] via SUBCUTANEOUS
  Administered 2011-04-12: 5 [IU] via SUBCUTANEOUS
  Administered 2011-04-12: 8 [IU] via SUBCUTANEOUS
  Administered 2011-04-12: 5 [IU] via SUBCUTANEOUS
  Administered 2011-04-13 (×2): 3 [IU] via SUBCUTANEOUS
  Administered 2011-04-13: 8 [IU] via SUBCUTANEOUS
  Administered 2011-04-13 (×2): 5 [IU] via SUBCUTANEOUS
  Administered 2011-04-14 (×2): 2 [IU] via SUBCUTANEOUS
  Administered 2011-04-14: 3 [IU] via SUBCUTANEOUS
  Administered 2011-04-14: 8 [IU] via SUBCUTANEOUS
  Administered 2011-04-14 (×2): 2 [IU] via SUBCUTANEOUS
  Administered 2011-04-15 (×3): 5 [IU] via SUBCUTANEOUS
  Administered 2011-04-15: 3 [IU] via SUBCUTANEOUS
  Administered 2011-04-15: 5 [IU] via SUBCUTANEOUS
  Administered 2011-04-16 (×3): 2 [IU] via SUBCUTANEOUS
  Administered 2011-04-16: 8 [IU] via SUBCUTANEOUS
  Administered 2011-04-16: 3 [IU] via SUBCUTANEOUS
  Administered 2011-04-17: 2 [IU] via SUBCUTANEOUS
  Administered 2011-04-17: 3 [IU] via SUBCUTANEOUS
  Administered 2011-04-17: 8 [IU] via SUBCUTANEOUS
  Administered 2011-04-17: 3 [IU] via SUBCUTANEOUS
  Administered 2011-04-18: 2 [IU] via SUBCUTANEOUS
  Administered 2011-04-18: 3 [IU] via SUBCUTANEOUS
  Administered 2011-04-18: 8 [IU] via SUBCUTANEOUS
  Administered 2011-04-18: 2 [IU] via SUBCUTANEOUS
  Administered 2011-04-19: 3 [IU] via SUBCUTANEOUS
  Administered 2011-04-19: 2 [IU] via SUBCUTANEOUS
  Administered 2011-04-19: 3 [IU] via SUBCUTANEOUS
  Filled 2011-04-11: qty 3

## 2011-04-11 MED ORDER — INSULIN ASPART PROT & ASPART (70-30 MIX) 100 UNIT/ML ~~LOC~~ SUSP
12.0000 [IU] | Freq: Two times a day (BID) | SUBCUTANEOUS | Status: DC
  Administered 2011-04-11 – 2011-04-15 (×8): 12 [IU] via SUBCUTANEOUS
  Filled 2011-04-11 (×2): qty 3

## 2011-04-11 MED ORDER — NICOTINE 14 MG/24HR TD PT24
14.0000 mg | MEDICATED_PATCH | Freq: Every day | TRANSDERMAL | Status: DC
  Administered 2011-04-11 – 2011-04-19 (×9): 14 mg via TRANSDERMAL
  Filled 2011-04-11 (×9): qty 1

## 2011-04-11 MED ORDER — AMPICILLIN-SULBACTAM SODIUM 3 (2-1) G IJ SOLR
3.0000 g | Freq: Four times a day (QID) | INTRAMUSCULAR | Status: DC
  Filled 2011-04-11: qty 3

## 2011-04-11 MED ORDER — CLINDAMYCIN PHOSPHATE 600 MG/50ML IV SOLN
600.0000 mg | Freq: Once | INTRAVENOUS | Status: AC
  Administered 2011-04-11: 600 mg via INTRAVENOUS
  Filled 2011-04-11: qty 50

## 2011-04-11 MED ORDER — MORPHINE SULFATE 2 MG/ML IJ SOLN
2.0000 mg | INTRAMUSCULAR | Status: DC | PRN
  Administered 2011-04-11 – 2011-04-12 (×8): 2 mg via INTRAVENOUS
  Administered 2011-04-13 (×2): 4 mg via INTRAVENOUS
  Administered 2011-04-13 – 2011-04-14 (×5): 2 mg via INTRAVENOUS
  Administered 2011-04-14 (×2): 4 mg via INTRAVENOUS
  Administered 2011-04-15 (×3): 2 mg via INTRAVENOUS
  Administered 2011-04-15: 4 mg via INTRAVENOUS
  Administered 2011-04-16 – 2011-04-18 (×8): 2 mg via INTRAVENOUS
  Filled 2011-04-11: qty 1
  Filled 2011-04-11 (×2): qty 2
  Filled 2011-04-11 (×3): qty 1
  Filled 2011-04-11: qty 2
  Filled 2011-04-11 (×9): qty 1
  Filled 2011-04-11: qty 2
  Filled 2011-04-11 (×2): qty 1
  Filled 2011-04-11: qty 2
  Filled 2011-04-11 (×2): qty 1
  Filled 2011-04-11 (×2): qty 2
  Filled 2011-04-11 (×5): qty 1

## 2011-04-11 MED ORDER — IOHEXOL 300 MG/ML  SOLN
100.0000 mL | Freq: Once | INTRAMUSCULAR | Status: AC | PRN
  Administered 2011-04-11: 100 mL via INTRAVENOUS

## 2011-04-11 MED ORDER — ONDANSETRON HCL 4 MG/2ML IJ SOLN: 4.0000 mg | Freq: Four times a day (QID) | INTRAMUSCULAR | Status: DC | PRN

## 2011-04-11 MED ORDER — FENTANYL CITRATE 0.05 MG/ML IJ SOLN
50.0000 ug | Freq: Once | INTRAMUSCULAR | Status: AC
  Administered 2011-04-11: 100 ug via INTRAVENOUS
  Filled 2011-04-11: qty 2

## 2011-04-11 MED ORDER — ONDANSETRON HCL 4 MG PO TABS: 4.0000 mg | ORAL_TABLET | Freq: Four times a day (QID) | ORAL | Status: DC | PRN

## 2011-04-11 MED ORDER — SENNOSIDES-DOCUSATE SODIUM 8.6-50 MG PO TABS: 1.0000 | ORAL_TABLET | Freq: Every day | ORAL | Status: DC | PRN

## 2011-04-11 MED ORDER — SODIUM CHLORIDE 0.9 % IV BOLUS (SEPSIS)
500.0000 mL | Freq: Once | INTRAVENOUS | Status: AC
  Administered 2011-04-11: 09:00:00 via INTRAVENOUS

## 2011-04-11 MED ORDER — DEXTROSE 5 % IV SOLN
1.0000 g | Freq: Two times a day (BID) | INTRAVENOUS | Status: DC
  Administered 2011-04-11 – 2011-04-13 (×4): 1 g via INTRAVENOUS
  Filled 2011-04-11 (×4): qty 1

## 2011-04-11 MED ORDER — SODIUM CHLORIDE 0.9 % IV SOLN
INTRAVENOUS | Status: DC
  Administered 2011-04-11: 10:00:00 via INTRAVENOUS

## 2011-04-11 MED ORDER — VANCOMYCIN HCL 1000 MG IV SOLR
1250.0000 mg | Freq: Two times a day (BID) | INTRAVENOUS | Status: DC
  Administered 2011-04-12 – 2011-04-15 (×8): 1250 mg via INTRAVENOUS
  Filled 2011-04-11 (×9): qty 1250

## 2011-04-11 MED ORDER — SIMVASTATIN 20 MG PO TABS
20.0000 mg | ORAL_TABLET | Freq: Every day | ORAL | Status: DC
  Administered 2011-04-11 – 2011-04-18 (×8): 20 mg via ORAL
  Filled 2011-04-11 (×9): qty 1

## 2011-04-11 MED ORDER — VANCOMYCIN HCL 1000 MG IV SOLR
2500.0000 mg | Freq: Once | INTRAVENOUS | Status: AC
  Administered 2011-04-11: 2500 mg via INTRAVENOUS
  Filled 2011-04-11: qty 2500

## 2011-04-11 MED ORDER — LISINOPRIL 5 MG PO TABS
5.0000 mg | ORAL_TABLET | Freq: Every day | ORAL | Status: DC
  Administered 2011-04-11 – 2011-04-19 (×9): 5 mg via ORAL
  Filled 2011-04-11 (×9): qty 1

## 2011-04-11 MED ORDER — SODIUM CHLORIDE 0.9 % IV SOLN
INTRAVENOUS | Status: DC
  Administered 2011-04-11 – 2011-04-13 (×5): via INTRAVENOUS

## 2011-04-11 NOTE — ED Provider Notes (Signed)
History     CSN: [id number] Arrival date & time: 04/11/2011  8:28 AM   First MD Initiated Contact with Patient 04/11/11 [phone]      Chief Complaint  Patient presents with  . Facial Pain    left lower facial/mouth pain, states is not her teeth that she has "skin boils " often and feels the same., has been using warm compresses    (Consider location/radiation/quality/duration/timing/severity/associated sxs/prior treatment) The history is provided by the patient.   patient states that she has another abscess in her face. She states she has a history of these. Began about a week ago. No relief with warm compresses. No fevers. She states that she has diabetes but has been off her medicine because she lost her insurance cannot afford them. She's not taken her insulin in about a month. Her sugars have been 400s and 500s. She states she is urinating a lot. There's been no drainage from the site of the abscess. Nothing makes it better touching it makes it worse.  Past Medical History  Diagnosis Date  . Diabetes mellitus     Type 2  . Hyperlipidemia   . Hypertension     History reviewed. No pertinent past surgical history.  Family History  Problem Relation Age of Onset  . Heart failure Mother   . Diabetes Mother   . Kidney disease Mother   . Kidney disease Father     History  Substance Use Topics  . Smoking status: Current Everyday Smoker -- 1.0 packs/day  . Smokeless tobacco: Not on file  . Alcohol Use: No    OB History    Grav Para Term Preterm Abortions TAB SAB Ect Mult Living                  Review of Systems  Constitutional: Positive for fatigue. Negative for chills.  HENT: Negative for ear pain.   Eyes: Negative for pain.  Respiratory: Negative for cough.   Cardiovascular: Negative for leg swelling.  Gastrointestinal: Negative for constipation and blood in stool.  Genitourinary: Negative for decreased urine volume.  Musculoskeletal: Negative for back pain.    Neurological: Negative for headaches.  Psychiatric/Behavioral: Negative for behavioral problems.    Allergies  Review of patient's allergies indicates no known allergies.  Home Medications  No current outpatient prescriptions on file.  BP 155/100  Pulse 105  Temp(Src) 100.7 F (38.2 C) (Oral)  Resp 22  Ht 5\' 10"  (1.778 m)  Wt 282 lb 11.2 oz (128.232 kg)  BMI 40.56 kg/m2  SpO2 92%  LMP 03/24/2011  Physical Exam  Nursing note and vitals reviewed. Constitutional: She is oriented to person, place, and time. She appears well-developed and well-nourished.  HENT:  Head: Atraumatic.       Swelling to left face. Firmness and left lower lip laterally out from lower cheek. There are skin breakdown in the bilateral chin. There is firmness without fluctuance. No drainage. There is no extension into the teeth apparent.  Eyes: EOM are normal. Pupils are equal, round, and reactive to light.  Neck: Normal range of motion. Neck supple.  Cardiovascular: Normal rate, regular rhythm and normal heart sounds.   No murmur heard. Pulmonary/Chest: Effort normal and breath sounds normal. No respiratory distress. She has no wheezes. She has no rales.  Abdominal: Soft. Bowel sounds are normal. She exhibits no distension. There is no tenderness. There is no rebound and no guarding.  Musculoskeletal: Normal range of motion.  Neurological: She is alert and  oriented to person, place, and time. No cranial nerve deficit.  Skin: Skin is warm and dry.  Psychiatric: She has a normal mood and affect. Her speech is normal.    ED Course  Procedures (including critical care time)  Labs Reviewed  CBC - Abnormal; Notable for the following:    MCV 77.8 (*)    All other components within normal limits  BASIC METABOLIC PANEL - Abnormal; Notable for the following:    Sodium 133 (*)    Glucose, Bld 369 (*)    All other components within normal limits  URINALYSIS, ROUTINE W REFLEX MICROSCOPIC - Abnormal; Notable  for the following:    Specific Gravity, Urine 1.031 (*)    Glucose, UA >1000 (*)    Hgb urine dipstick SMALL (*)    Ketones, ur 40 (*)    Protein, ur 100 (*)    Leukocytes, UA TRACE (*)    All other components within normal limits  GLUCOSE, CAPILLARY - Abnormal; Notable for the following:    Glucose-Capillary 350 (*)    All other components within normal limits  URINE MICROSCOPIC-ADD ON - Abnormal; Notable for the following:    Squamous Epithelial / LPF FEW (*)    All other components within normal limits  GLUCOSE, CAPILLARY - Abnormal; Notable for the following:    Glucose-Capillary 311 (*)    All other components within normal limits  GLUCOSE, CAPILLARY - Abnormal; Notable for the following:    Glucose-Capillary 357 (*)    All other components within normal limits  POCT I-STAT, CHEM 8 - Abnormal; Notable for the following:    Glucose, Bld 393 (*)    All other components within normal limits  DIFFERENTIAL  PREGNANCY, URINE  CULTURE, BLOOD (ROUTINE X 2)  CULTURE, BLOOD (ROUTINE X 2)  POCT CBG MONITORING  HEMOGLOBIN A1C  CBC  CREATININE, SERUM  URINE RAPID DRUG SCREEN (HOSP PERFORMED)  HIV ANTIBODY (ROUTINE TESTING)  CARDIAC PANEL(CRET KIN+CKTOT+MB+TROPI)   X-ray Chest Pa And Lateral   04/11/2011  *RADIOLOGY REPORT*  Clinical Data: Chest pain and shortness of breath.  CHEST - 2 VIEW  Comparison: 03/21/2010  Findings: Heart size and vascularity are normal and the lungs are clear except for a tiny area of scarring in the right midzone.  No significant osseous abnormality.  IMPRESSION: Essentially normal chest.  Original Report Authenticated By: Larey Seat, M.D.   Ct Soft Tissue Neck W Contrast  04/11/2011  *RADIOLOGY REPORT*  Clinical Data: Facial cellulitis.  CT NECK WITH CONTRAST  Technique:  Multidetector CT imaging of the neck was performed with intravenous contrast.  Contrast: 132mL OMNIPAQUE IOHEXOL 300 MG/ML IV SOLN  Comparison: None.  Findings: There is soft tissue  stranding anterior to the body of the mandible extending to the right and left of midline.  There is some reactive submandibular adenopathy.  However, there is no visible deep space infection in the floor of the mouth or elsewhere in the neck.  Submandibular glands and parotid glands are normal. Slight adenopathy in both sides of the neck, greater on the left on the right.  No prevertebral soft tissue swelling.  Thyroid gland is normal.  Lung apices are clear.  Osseous structures of the skull and mandible demonstrate no significant abnormalities.  IMPRESSION: Cellulitis of the soft tissues anterior to the mandible.    No deep space infection.  Original Report Authenticated By: Larey Seat, M.D.     1. Facial abscess   2. Hyperglycemia  3. Noncompliance with medications       MDM  Hyperglycemia and a facial abscess. Patient is diabetic and has not been taking her medicines. She will be admitted to her primary care doctor. She's not appear to be in DKA        Ovid Curd R. Alvino Chapel, MD 04/11/11 [phone]

## 2011-04-11 NOTE — ED Notes (Signed)
Patient is resting comfortably. 

## 2011-04-11 NOTE — ED Notes (Signed)
Patients cbg has been done 357 on the glucometer.  Verbal order done by the rn/danetta.

## 2011-04-11 NOTE — ED Notes (Signed)
Notified RN of oral temp 100.4, HR109 and b/p 159/96

## 2011-04-11 NOTE — Progress Notes (Addendum)
ANTIBIOTIC CONSULT NOTE - INITIAL  Pharmacy Consult for Vancomycin and Zosyn Indication: facial abscess  No Known Allergies  Patient Measurements: Height: 5\' 10"  (177.8 cm) Weight: 282 lb 11.2 oz (128.232 kg) IBW/kg (Calculated) : 68.5   Vital Signs: Temp: 100.7 F (38.2 C) (11/19 1602) Temp src: Oral (11/19 1602) BP: 155/100 mmHg (11/19 1602) Pulse Rate: 105  (11/19 1602) Intake/Output from previous day:   Intake/Output from this shift:    Labs:  Basename 04/11/11 0922 04/11/11 0851  WBC -- 8.7  HGB 13.6 13.0  PLT -- 217  LABCREA -- --  CREATININE 0.80 0.63   Estimated Creatinine Clearance: 144.5 ml/min (by C-G formula based on Cr of 0.8).   Microbiology: No results found for this or any previous visit (from the past 720 hour(s)).  Medical History: Past Medical History  Diagnosis Date  . Diabetes mellitus     Type 2  . Hyperlipidemia   . Hypertension     Medications:  Med list pending Assessment: 35 year old woman admitted with facial abscess to start on broad spectrum antibiotic therapy  Goal of Therapy:  Vancomycin trough level 10-15 mcg/ml  Plan:  Follow up culture results.  Monitor Vancomycin trough at steady state if therapy to continue for > 72 hours.  Vancomycin 2.5g IV x 1 dose, then 1250mg  q12.  Zosyn 3.375 g IV q8h (4 hour infusion)  Candie Mile 04/11/2011,4:25 PM

## 2011-04-11 NOTE — ED Notes (Signed)
Pt states has been off of her diabetes medicine for "months" because unable to afford.

## 2011-04-11 NOTE — ED Notes (Signed)
Patient transported to CT 

## 2011-04-11 NOTE — ED Notes (Signed)
Second call placed to Dr. Page Spiro

## 2011-04-11 NOTE — Progress Notes (Signed)
Pt D/C'd from contact isolation due to negative MRSA PCR, according to Infectious Disease Protocol.

## 2011-04-11 NOTE — Progress Notes (Signed)
Patient ID: [name] [name], female   DOB: 20-Jan-1976, 35 y.o.   MRN: [id number] Internal Medicine Teaching Service Attending Note Date: 04/11/2011  Patient name: [name] [name]  Medical record number: [id number]  Date of birth: 1975/07/12   I have seen and evaluated [place] and discussed their care with the Residency Team.  35 yo F with DM for last 10 years, has no PCP. Comes to hospital today with swelling and pain in R side of chin beginning 04-08-11. She has had increasing swelling and pain, she attempted to express d/c from this but only clear fluid came out. She denies dysphagia or difficulty breathing. She has noted swelling extending down from her chin and3 down her neck.   Physical Exam: Blood pressure 155/100, pulse 105, temperature 100.7 F (38.2 C), temperature source Oral, resp. rate 22, height 5\' 10"  (1.778 m), weight 128.232 kg (282 lb 11.2 oz), last menstrual period 03/24/2011, SpO2 92.00%. BP 155/100  Pulse 105  Temp(Src) 100.7 F (38.2 C) (Oral)  Resp 22  Ht 5\' 10"  (1.778 m)  Wt 128.232 kg (282 lb 11.2 oz)  BMI 40.56 kg/m2  SpO2 92%  LMP 03/24/2011  General Appearance:    Alert, cooperative, no distress, appears stated age. Obese  Head:    Normocephalic, without obvious abnormality, atraumatic  Eyes:    PERRL, conjunctiva/corneas clear, EOM's intact,        Throat:   Lips, mucosa, and tongue normal; teeth and gums normal  Neck:   Supple, symmetrical, trachea midline, no adenopathy;       Lungs:     Clear to auscultation bilaterally, respirations unlabored      Heart:    Regular rate and rhythm, S1 and S2 normal, no murmur     Abdomen:     Soft, non-tender, bowel sounds active all four quadrants,    no masses, no organomegaly        Extremities:   Non-pitting LE edema. No diabetic foot lesions. Onychomycosis.   Pulses:   2+ and symmetric all extremities  Skin:  there is edema, significant tenderness on her chin (greatest on the L side).  There is breakdown of the skin and appears to have some bubbling.      Neurologic:   CNII-XII intact, normal  sensation     Lab results: Results for orders placed during the hospital encounter of 04/11/11 (from the past 24 hour(s))  CBC     Status: Abnormal   Collection Time   04/11/11  8:51 AM      Component Value Range   WBC 8.7  4.0 - 10.5 (K/uL)   RBC 4.72  3.87 - 5.11 (MIL/uL)   Hemoglobin 13.0  12.0 - 15.0 (g/dL)   HCT 36.7  36.0 - 46.0 (%)   MCV 77.8 (*) 78.0 - 100.0 (fL)   MCH 27.5  26.0 - 34.0 (pg)   MCHC 35.4  30.0 - 36.0 (g/dL)   RDW 12.5  11.5 - 15.5 (%)   Platelets 217  150 - 400 (K/uL)  DIFFERENTIAL     Status: Normal   Collection Time   04/11/11  8:51 AM      Component Value Range   Neutrophils Relative 72  43 - 77 (%)   Neutro Abs 6.3  1.7 - 7.7 (K/uL)   Lymphocytes Relative 20  12 - 46 (%)   Lymphs Abs 1.7  0.7 - 4.0 (K/uL)   Monocytes Relative 7  3 - 12 (%)  Monocytes Absolute 0.6  0.1 - 1.0 (K/uL)   Eosinophils Relative 1  0 - 5 (%)   Eosinophils Absolute 0.1  0.0 - 0.7 (K/uL)   Basophils Relative 0  0 - 1 (%)   Basophils Absolute 0.0  0.0 - 0.1 (K/uL)  BASIC METABOLIC PANEL     Status: Abnormal   Collection Time   04/11/11  8:51 AM      Component Value Range   Sodium 133 (*) 135 - 145 (mEq/L)   Potassium 4.7  3.5 - 5.1 (mEq/L)   Chloride 98  96 - 112 (mEq/L)   CO2 26  19 - 32 (mEq/L)   Glucose, Bld 369 (*) 70 - 99 (mg/dL)   BUN 13  6 - 23 (mg/dL)   Creatinine, Ser 0.63  0.50 - 1.10 (mg/dL)   Calcium 9.3  8.4 - 10.5 (mg/dL)   GFR calc non Af Amer >90  >90 (mL/min)   GFR calc Af Amer >90  >90 (mL/min)  PREGNANCY, URINE     Status: Normal   Collection Time   04/11/11  9:06 AM      Component Value Range   Preg Test, Ur NEGATIVE    URINALYSIS, ROUTINE W REFLEX MICROSCOPIC     Status: Abnormal   Collection Time   04/11/11  9:06 AM      Component Value Range   Color, Urine YELLOW  YELLOW    Appearance CLEAR  CLEAR    Specific Gravity, Urine  1.031 (*) 1.005 - 1.030    pH 5.5  5.0 - 8.0    Glucose, UA >1000 (*) NEGATIVE (mg/dL)   Hgb urine dipstick SMALL (*) NEGATIVE    Bilirubin Urine NEGATIVE  NEGATIVE    Ketones, ur 40 (*) NEGATIVE (mg/dL)   Protein, ur 100 (*) NEGATIVE (mg/dL)   Urobilinogen, UA 0.2  0.0 - 1.0 (mg/dL)   Nitrite NEGATIVE  NEGATIVE    Leukocytes, UA TRACE (*) NEGATIVE   URINE MICROSCOPIC-ADD ON     Status: Abnormal   Collection Time   04/11/11  9:06 AM      Component Value Range   Squamous Epithelial / LPF FEW (*) RARE    WBC, UA 3-6  <3 (WBC/hpf)   RBC / HPF 0-2  <3 (RBC/hpf)   Bacteria, UA RARE  RARE    Urine-Other FEW YEAST    GLUCOSE, CAPILLARY     Status: Abnormal   Collection Time   04/11/11  9:13 AM      Component Value Range   Glucose-Capillary 350 (*) 70 - 99 (mg/dL)   Comment 1 Documented in Chart     Comment 2 Notify RN    POCT I-STAT, CHEM 8     Status: Abnormal   Collection Time   04/11/11  9:22 AM      Component Value Range   Sodium 135  135 - 145 (mEq/L)   Potassium 4.7  3.5 - 5.1 (mEq/L)   Chloride 100  96 - 112 (mEq/L)   BUN 14  6 - 23 (mg/dL)   Creatinine, Ser 0.80  0.50 - 1.10 (mg/dL)   Glucose, Bld 393 (*) 70 - 99 (mg/dL)   Calcium, Ion 1.19  1.12 - 1.32 (mmol/L)   TCO2 27  0 - 100 (mmol/L)   Hemoglobin 13.6  12.0 - 15.0 (g/dL)   HCT 40.0  36.0 - 46.0 (%)  GLUCOSE, CAPILLARY     Status: Abnormal   Collection Time   04/11/11 10:15  AM      Component Value Range   Glucose-Capillary 311 (*) 70 - 99 (mg/dL)  GLUCOSE, CAPILLARY     Status: Abnormal   Collection Time   04/11/11  3:13 PM      Component Value Range   Glucose-Capillary 357 (*) 70 - 99 (mg/dL)  GLUCOSE, CAPILLARY     Status: Abnormal   Collection Time   04/11/11  5:09 PM      Component Value Range   Glucose-Capillary 308 (*) 70 - 99 (mg/dL)    Imaging results:  X-ray Chest Pa And Lateral   04/11/2011  *RADIOLOGY REPORT*  Clinical Data: Chest pain and shortness of breath.  CHEST - 2 VIEW  Comparison:  03/21/2010  Findings: Heart size and vascularity are normal and the lungs are clear except for a tiny area of scarring in the right midzone.  No significant osseous abnormality.  IMPRESSION: Essentially normal chest.  Original Report Authenticated By: Larey Seat, M.D.   Ct Soft Tissue Neck W Contrast  04/11/2011  *RADIOLOGY REPORT*  Clinical Data: Facial cellulitis.  CT NECK WITH CONTRAST  Technique:  Multidetector CT imaging of the neck was performed with intravenous contrast.  Contrast: 187mL OMNIPAQUE IOHEXOL 300 MG/ML IV SOLN  Comparison: None.  Findings: There is soft tissue stranding anterior to the body of the mandible extending to the right and left of midline.  There is some reactive submandibular adenopathy.  However, there is no visible deep space infection in the floor of the mouth or elsewhere in the neck.  Submandibular glands and parotid glands are normal. Slight adenopathy in both sides of the neck, greater on the left on the right.  No prevertebral soft tissue swelling.  Thyroid gland is normal.  Lung apices are clear.  Osseous structures of the skull and mandible demonstrate no significant abnormalities.  IMPRESSION: Cellulitis of the soft tissues anterior to the mandible.    No deep space infection.  Original Report Authenticated By: Larey Seat, M.D.    Assessment and Plan: I agree with the formulated Assessment and Plan with the following changes: Cellulitis- she has a hx of previous MRSA and GBS infections (including IE). Will check her BCx, she needs to be on broad spectrum anbx (vanco) and coverage of GNR including Pseudomonas as she is diabetic (although i do not believe she needs anaerobic coverage as this does not involve her dentition). Her CT scan does not show involvement of deeper structures.  DM- will try to improve her therapy. This will somewhat clouded by her acute process which will innately increase her Glc.  Chest Pain- will watch her CEs. Hypertension-  will start ACE-I. Titrate up as needed. Her Cr is normal.  Obesity- will have dietary meet with her. May improve her diabetic control.  Non-compliance- will try to get her set up with PCP f/u.  Check HIV test in accordance with CDC  Guidelines.

## 2011-04-11 NOTE — ED Notes (Signed)
Received patient from [facility] zone and assumed care, received patient with NS infusing without difficulty, Blood cultures drawn, Pt medicated as order with 50 mcg of Fentanyl for pain 10/10, pending admission

## 2011-04-11 NOTE — ED Notes (Signed)
Call placed to Dr. Guadlupe Spanish, [name]

## 2011-04-11 NOTE — ED Notes (Signed)
Cleocin stopped, blood cultures ordered, waiting for lab.

## 2011-04-11 NOTE — ED Notes (Signed)
Report given to yellow team nurse pt aware of plans to admit.

## 2011-04-11 NOTE — H&P (Signed)
Hospital Admission Note Date: 04/11/2011  Patient name: [name] [name] Medical record number: [id number] Date of birth: [birth year] Age: 35 y.o. Gender: female PCP: PRIBULA,CHRISTOPHER, MD  Medical Service: Internal medicine teaching service - [facility]  Attending physician:  Dr. Johnnye Sima   1st Contact: Dr. [name]  Pager: [phone] 2nd Contact: Dr. 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 She states that 3 days prior to admission she woke up and noted some soreness and a small swollen area on her left lower chin. She denies any boil/pimple formation.  She attempted to apply warm compresses to alleviate the pain and promote range but states this only worsened the swelling and increased her pain. She states these symptoms are similar to soft tissue infection she has experienced in the past. She notes the area drained a small amount of clear fluid and one day prior to arrival; she denies any purulent or bloody drainage.  She has not taken her temperature at home but notes Reiger is one day prior to arrival; denies sweats. She denies nausea, vomiting, diarrhea, abdominal pain, bright red blood per, dark tarry stools, shortness of breath, and syncope.   She denies difficulty breathing or difficulty swallowing. She denies any recent dental work, or problems with dental cavities/abscesses.  She admits to some sternal chest pain that began 2 days prior to arrival. She characterizes chest pain is intermittent and lasting 5-10 minutes in duration.  She describes her  pain as a tightness that is worse with coughing and unchanged with eating.   She denies any other precipitating, aggravating, or alleviating factors. She notes her cough is productive of phlegm that is yellow to greenish in color. She denies any sick contacts.    Meds: Insulin 70-30 index 20 units subcutaneous twice a day with meals Lisinopril 5 mg 1 tab by mouth daily Metformin 500 mg tabs 1 tab by mouth twice a day Pravachol 40 mg tabs 1 tab by mouth daily  Allergies: NKDA  Past Medical History  Diagnosis Date  Diabetes mellitus    Type 2  Hyperlipidemia   Hypertension Group B. strep bacteremia and  infective endocarditis (05/2000)   - MV and TV involvement on TEE    - blood cultures positive for s. agalactaie Abdominal wall abscess (02/2010)   - MRSA culture positive Multiple abscesses, perirectal, thigh, buttock (01/2009)   - Polymicrobic, GBS and E. Coli culture positive Thigh abscess  - culture positive for GBS (11/2008)     Family history: Mother: Diabetes, blindness, end-stage renal disease on HD Father: End-stage renal disease on HD Grandmother: Diabetes, coronary artery disease with MI at age 40  Social history: Patient lives in [place] with her mother. She lost her job 2 weeks ago; previously worked for a telephone company for 16 years. She is a current smoker. She smokes one half of a pack per day and has for the past 9 years. She denies alcohol use or illicit drug use.  She is uninsured.  Review of Systems: Pertinent items are noted in HPI.  Physical Exam: Blood pressure 143/104, pulse 99, temperature 99.6  F (37.6 C), temperature source Oral, last menstrual period 03/24/2011, SpO2 99.00%. GEN: No acute distress; patient appears uncomfortable secondary to pain and is occasionally tearful during interview.  Alert and oriented x 3.   HEENT: head is autraumatic and normocephalic.  Neck is supple without palpable masses or lymphadenopathy.  No JVD or carotid bruits.   Vision intact.  EOMI.  PERRLA.  Sclerae anicteric.  Conjunctivae without pallor or injection. Mucous membranes are dry.  Oropharynx is without erythema, exudates, or other abnormal lesions.  Dentition is fair.  Significant erythema and swelling noted along the chin with approximately 1 cm x 1 cm crusted lesion present on left lateral aspect of chin.  Palpable induration present involving lower lip, chin, and possibly extending into the submental area; this area is painful to palpation.  No dental caries or dental abscess noted in lower front teeth.  No drainage expressible on exam. No palpable fluctuance. RESP:  Lungs are clear to ascultation bilaterally with good air movement.  No wheezes, ronchi, or rubs. CARDIOVASCULAR: regular rate, normal rhythm.  Clear S1, S2, 2/6 systolic murmur noted , no gallops, or rubs. ABDOMEN: soft, non-tender, non-distended.  Bowels sounds present in all quadrants and o.  No palpable masses. EXT: warm and dry.  Diminished DP and PT pulses in bilateral lower extremities.  Skin on lower extremities is significantly dry and flaky. No edema, clubbing, or cyanosis. NEURO: CN II-XII grossly intact.  No focal deficit.  Lab results: Basic Metabolic Panel:  [facility] 04/11/11 0851  NA 133*  K 4.7  CL 98  CO2 26  GLUCOSE 369*  BUN 13  CREATININE 0.63  CALCIUM 9.3  MG --  PHOS --   CBC:  Basename 04/11/11 0851  WBC 8.7  NEUTROABS 6.3  HGB 13.0  HCT 36.7  MCV 77.8*  PLT 217   CBG:  Basename 04/11/11 1015 04/11/11 0913  GLUCAP 311* 350*   Imaging results:  No results found.  Assessment & Plan by Problem: # Facial cellulitis: Patient presents with 3-4 days of progressive swelling, pain, and erythema involving her left lower lip and chin.  Findings on physical exam her concerning for soft tissue infection that may be extending into the deep neck/submandibular space despite lack of fever or leukocytosis.  She is currently afebrile, hemodynamically stable, and  within an intact airway and is not in imminent risk of compromise.  She has had multiple skin and soft tissue infections in the past, including infection with MRSA.  Will initially treat with empiric antibiotics to cover a broad range of organisms; specifically strep sp. and anaerobes as well MRSA and p. aeruginosa given her increased risk as a result of her DM.  It is unclear if only the superficial skin along her chin and is involved or if the infection is extending into the deep neck space; she will need further imaging to determine the extent of soft tissue involvement and to evaluate for abscess formation that may be amenable to drainage. - Admit to regular floor - Empiric antimicrobial treatment with vancomycin and Zosyn (may narrow coverage once CT results are back) - Blood cultures x 2 STAT (these may be confounded as pt received one dose of clindamycin prior to blood culture draw) - CT neck with contrast to evaluate for abscess formation and to determine extent of infection - Consider ENT consult pending CT results  # Chest pain: at this point the differential for her chest pain is broad.  She has numerous risk factors  for CAD/ACS; her history is not suggestive of ACS but given her DM and other risk factors it is reasonable to evaluate for this.  She reports a cough that could reflect PNA (this seems less likely based on history, lack of fever/leukocytosis), poorly controlled GERD, adverse reaction from lisinopril, or simply a cough resulting from her ongoing tobacco use.   - Will obtain a 12-lead EKG - Check one set of cardiac enzymes - PA/Lateral CXR - Consider trial of PPI - Continue pravachol - Obtain lipid panel in the morning # Diabetes: - Check A1c to assess glycemic control - Continue Insulin 70/30 and SSI - Hold metformin as patient will receive contrast for CT neck  # HTN: blood pressure currently stable. - Continue lisinopril - Continue to monitor BP  # DVT prophylaxis:  lovenox  Signed: MILLS,KRISTIN 04/11/2011, 11:08 AM

## 2011-04-11 NOTE — ED Notes (Signed)
Patients elevated blood pressure and heart rate reported to rn/danetta.

## 2011-04-11 NOTE — ED Notes (Signed)
CBG results:350

## 2011-04-12 DIAGNOSIS — L0201 Cutaneous abscess of face: Principal | ICD-10-CM

## 2011-04-12 LAB — CBC
Platelets: 240 10*3/uL (ref 150–400)
RDW: 12.5 % (ref 11.5–15.5)
WBC: 8.9 10*3/uL (ref 4.0–10.5)

## 2011-04-12 LAB — GLUCOSE, CAPILLARY
Glucose-Capillary: 157 mg/dL — ABNORMAL HIGH (ref 70–99)
Glucose-Capillary: 275 mg/dL — ABNORMAL HIGH (ref 70–99)

## 2011-04-12 LAB — BASIC METABOLIC PANEL
Calcium: 8.5 mg/dL (ref 8.4–10.5)
Creatinine, Ser: 0.53 mg/dL (ref 0.50–1.10)
GFR calc Af Amer: >90 mL/min (ref >90)

## 2011-04-12 LAB — DIFFERENTIAL
Basophils Absolute: 0 10*3/uL (ref 0.0–0.1)
Eosinophils Absolute: 0.1 10*3/uL (ref 0.0–0.7)
Lymphocytes Relative: 27 % (ref 12–46)
Lymphs Abs: 2.4 10*3/uL (ref 0.7–4.0)
Monocytes Relative: 7 % (ref 3–12)

## 2011-04-12 LAB — HEMOGLOBIN A1C: Mean Plasma Glucose: 321 mg/dL — ABNORMAL HIGH (ref <117)

## 2011-04-12 LAB — HIV ANTIBODY (ROUTINE TESTING W REFLEX): HIV: NONREACTIVE

## 2011-04-12 LAB — GC/CHLAMYDIA PROBE AMP, URINE: GC Probe Amp, Urine: NEGATIVE

## 2011-04-12 LAB — LIPID PANEL
LDL Cholesterol: 104 mg/dL — ABNORMAL HIGH (ref 0–99)
Triglycerides: 138 mg/dL (ref <150)

## 2011-04-12 MED ORDER — ZOLPIDEM TARTRATE 5 MG PO TABS
5.0000 mg | ORAL_TABLET | Freq: Once | ORAL | Status: AC
  Administered 2011-04-12: 5 mg via ORAL
  Filled 2011-04-12: qty 1

## 2011-04-12 MED ORDER — WHITE PETROLATUM GEL
Status: AC
  Administered 2011-04-12: 11:00:00
  Filled 2011-04-12: qty 5

## 2011-04-12 NOTE — Progress Notes (Signed)
Patient ID: [name] [name], female   DOB: 04-28-1976, 35 y.o.   MRN: [id number] Internal Medicine Teaching Service Attending Note Date: 04/12/2011  Patient name: [name] [name]  Medical record number: [id number]  Date of birth: 02/28/1976    This patient has been seen and discussed with the house staff. Please see their note for complete details. I concur with their findings with the following additions/corrections: He facial/chin area is crusting over. She appears to be improving. No change i nher antibiotics  Bobby Rumpf 04/12/2011, 12:02 PM

## 2011-04-12 NOTE — Progress Notes (Addendum)
Subjective: Pain continues about the same today.  Patient does not report significant change in swelling.  She reports no problems with swallowing/opening mouth/chewing and asks for better food than clear liquids.    Objective: Vital signs in last 24 hours: Filed Vitals:   04/11/11 1602 04/11/11 2210 04/12/11 0615 04/12/11 0900  BP: 155/100 101/63 111/65 113/70  Pulse: 105 95 100 99  Temp: 100.7 F (38.2 C) 99.5 F (37.5 C) 97 F (36.1 C)   TempSrc: Oral Oral Oral   Resp: 22 20 20    Height: 5\' 10"  (1.778 m)     Weight: 282 lb 11.2 oz (128.232 kg)     SpO2: 92% 96% 98%    Weight change:   Intake/Output Summary (Last 24 hours) at 04/12/11 1153 Last data filed at 04/12/11 0700  Gross per 24 hour  Intake   2134 ml  Output    350 ml  Net   1784 ml   Physical Exam: General: alert, well-developed, and cooperative to examination. In mild distress. Mouth: pharynx pink and moist, no erythema, and no exudates.  Face/Neck: Facial swelling of lower face and chin, most prominent in lower left lateral chin.  No obvious drainage.  Mild induration.   Lungs: normal respiratory effort, no accessory muscle use, normal breath sounds, no crackles, and no wheezes. Heart: normal rate, regular rhythm. 2/6 systolic murmur.    Lab Results: Basic Metabolic Panel:  Lab [id number] 0550 04/11/11 1638 04/11/11 0922 04/11/11 0851  NA 134* -- 135 --  K 3.8 -- 4.7 --  CL 103 -- 100 --  CO2 23 -- -- 26  GLUCOSE 194* -- 393* --  BUN 9 -- 14 --  CREATININE 0.53 0.53 -- --  CALCIUM 8.5 -- -- 9.3  MG -- -- -- --  PHOS -- -- -- --   CBC:  Lab 04/12/11 0550 04/11/11 1638 04/11/11 0851  WBC 8.9 9.3 --  NEUTROABS 5.8 -- 6.3  HGB 11.1* 12.6 --  HCT 31.8* 35.3* --  MCV 78.3 77.9* --  PLT 240 230 --   Cardiac Enzymes:  Lab 04/11/11 1638  CKTOTAL 128  CKMB 2.0  CKMBINDEX --  TROPONINI <0.30   CBG:  Lab 04/12/11 0758 04/12/11 0436 04/12/11 0031 04/11/11 2034 04/11/11 1709 04/11/11 1513    GLUCAP 157* 207* 211* 263* 308* 357*   Hemoglobin A1C:  Lab 04/11/11 1638  HGBA1C 12.8*   Fasting Lipid Panel:  Lab 04/12/11 0550  CHOL 174  HDL 42  LDLCALC 104*  TRIG 138  CHOLHDL 4.1  LDLDIRECT --    Micro Results: Recent Results (from the past 240 hour(s))  CULTURE, BLOOD (ROUTINE X 2)     Status: Normal (Preliminary result)   Collection Time   04/11/11 10:40 AM      Component Value Range Status Comment   Specimen Description BLOOD RIGHT ARM   Final    Special Requests BOTTLES DRAWN AEROBIC ONLY 4CC   Final    Setup Time [id number]   Final    Culture     Final    Value:        BLOOD CULTURE RECEIVED NO GROWTH TO DATE CULTURE WILL BE HELD FOR 5 DAYS BEFORE ISSUING A FINAL NEGATIVE REPORT   Report Status PENDING   Incomplete   CULTURE, BLOOD (ROUTINE X 2)     Status: Normal (Preliminary result)   Collection Time   04/11/11 10:58 AM      Component Value Range Status Comment  Specimen Description BLOOD RIGHT HAND   Final    Special Requests BOTTLES DRAWN AEROBIC AND ANAEROBIC 10CC   Final    Setup Time [id number]   Final    Culture     Final    Value:        BLOOD CULTURE RECEIVED NO GROWTH TO DATE CULTURE WILL BE HELD FOR 5 DAYS BEFORE ISSUING A FINAL NEGATIVE REPORT   Report Status PENDING   Incomplete   MRSA PCR SCREENING     Status: Normal   Collection Time   04/11/11  4:51 PM      Component Value Range Status Comment   MRSA by PCR NEGATIVE  NEGATIVE  Final    Studies/Results: X-ray Chest Pa And Lateral   04/11/2011  *RADIOLOGY REPORT*  Clinical Data: Chest pain and shortness of breath.  CHEST - 2 VIEW  Comparison: 03/21/2010  Findings: Heart size and vascularity are normal and the lungs are clear except for a tiny area of scarring in the right midzone.  No significant osseous abnormality.  IMPRESSION: Essentially normal chest.  Original Report Authenticated By: Larey Seat, M.D.   Ct Soft Tissue Neck W Contrast  04/11/2011  *RADIOLOGY REPORT*   Clinical Data: Facial cellulitis.  CT NECK WITH CONTRAST  Technique:  Multidetector CT imaging of the neck was performed with intravenous contrast.  Contrast: 178mL OMNIPAQUE IOHEXOL 300 MG/ML IV SOLN  Comparison: None.  Findings: There is soft tissue stranding anterior to the body of the mandible extending to the right and left of midline.  There is some reactive submandibular adenopathy.  However, there is no visible deep space infection in the floor of the mouth or elsewhere in the neck.  Submandibular glands and parotid glands are normal. Slight adenopathy in both sides of the neck, greater on the left on the right.  No prevertebral soft tissue swelling.  Thyroid gland is normal.  Lung apices are clear.  Osseous structures of the skull and mandible demonstrate no significant abnormalities.  IMPRESSION: Cellulitis of the soft tissues anterior to the mandible.    No deep space infection.  Original Report Authenticated By: Larey Seat, M.D.   Medications: I have reviewed the patient's current medications. Scheduled Meds:   . ceFEPime (MAXIPIME) IV  1 g Intravenous Q12H  . enoxaparin  40 mg Subcutaneous Q24H  . insulin aspart  0-15 Units Subcutaneous Q4H  . insulin aspart protamine-insulin aspart  12 Units Subcutaneous BID WC  . lisinopril  5 mg Oral Daily  . nicotine  14 mg Transdermal Daily  . simvastatin  20 mg Oral q1800  . vancomycin  1,250 mg Intravenous Q12H  . vancomycin  2,500 mg Intravenous Once  . white petrolatum      . zolpidem  5 mg Oral Once  . DISCONTD: ampicillin-sulbactam (UNASYN) IV  3 g Intravenous Q6H  . DISCONTD: piperacillin-tazobactam (ZOSYN)  IV  3.375 g Intravenous Q8H   Continuous Infusions:   . sodium chloride 150 mL/hr at 04/12/11 1009  . DISCONTD: sodium chloride 125 mL/hr at 04/11/11 0935   PRN Meds:.iohexol, morphine, ondansetron (ZOFRAN) IV, ondansetron, senna-docusate Assessment/Plan:  Skin/Soft Tissue Infection of lower face/neck: Patient is still  having pain.  CT showed just skin and soft tissue infection.   Will continue Vancomycin and Cefepime for broad coverage including pseudomonas and MRSA.  Continue to monitor. Cont Morphine PRN for pain  Chest Pain: EKG still pending, was not done yesterday despite being ordered.  CEs negative x  1 set.  PA/Lat CXR normal.  Lipid panel LDL 104 not quite at goal, but low enough that we will continue her home pravastatin and let PCP manage this.    Fu EKG today  DM: CBGs starting to improve.  Will continue home 70/30 insulin and sliding scale.  Will continue to hold metformin one more day. New Info from patient: Pt has not taken any medication for her diabetes in over 6 months.    HTN: Some high pressures yesterday, but good so far today 110s/60-70.  Will continue low dose lisinopril and continue to monitor.  First step would be to increase lisinopril dose if she needs better control.  DVT Proph: Lovenox    LOS: 1 day   Kjell Brannen, BRAD 04/12/2011, 11:53 AM

## 2011-04-13 ENCOUNTER — Other Ambulatory Visit: Payer: Self-pay | Admitting: Dietician

## 2011-04-13 DIAGNOSIS — E119 Type 2 diabetes mellitus without complications: Secondary | ICD-10-CM

## 2011-04-13 DIAGNOSIS — R011 Cardiac murmur, unspecified: Secondary | ICD-10-CM

## 2011-04-13 LAB — GLUCOSE, CAPILLARY: Glucose-Capillary: 117 mg/dL — ABNORMAL HIGH (ref 70–99)

## 2011-04-13 MED ORDER — PANTOPRAZOLE SODIUM 40 MG PO TBEC
40.0000 mg | DELAYED_RELEASE_TABLET | Freq: Every day | ORAL | Status: DC
  Administered 2011-04-13 – 2011-04-17 (×5): 40 mg via ORAL
  Filled 2011-04-13 (×4): qty 1

## 2011-04-13 MED ORDER — ZOLPIDEM TARTRATE 5 MG PO TABS
5.0000 mg | ORAL_TABLET | Freq: Once | ORAL | Status: AC
  Administered 2011-04-13: 5 mg via ORAL
  Filled 2011-04-13: qty 1

## 2011-04-13 MED ORDER — SODIUM CHLORIDE 0.9 % IV SOLN
500.0000 mg | Freq: Four times a day (QID) | INTRAVENOUS | Status: DC
  Administered 2011-04-13 – 2011-04-16 (×12): 500 mg via INTRAVENOUS
  Filled 2011-04-13 (×14): qty 500

## 2011-04-13 MED ORDER — LIVING WELL WITH DIABETES BOOK
Freq: Once | Status: DC
  Filled 2011-04-13: qty 1

## 2011-04-13 MED ORDER — FLUCONAZOLE 150 MG PO TABS
150.0000 mg | ORAL_TABLET | Freq: Once | ORAL | Status: AC
  Administered 2011-04-13: 150 mg via ORAL
  Filled 2011-04-13 (×2): qty 1

## 2011-04-13 NOTE — Progress Notes (Signed)
Patient ID: [name], female   DOB: Sep 23, 1975, 35 y.o.   MRN: [id number] Internal Medicine Teaching Service Attending Note Date: 04/13/2011  Patient name: [name] [name]  Medical record number: [id number]  Date of birth: 03/15/1976    This patient has been seen and discussed with the house staff. Please see their note for complete details. I concur with their findings with the following additions/corrections: Her submental cellulitis is improving. She has developed  Swelling of her lower lip. It is firm. Not clear if this is from ADR (anbx vs ACE-I), extension of infection.  Will check a TTE to eval her murmur.  Her diabetic control is improving, need to get her "buy in" to continue this when at home.  Bobby Rumpf 04/13/2011, 11:08 AM

## 2011-04-13 NOTE — Progress Notes (Addendum)
Subjective: Patient reports decreased amount of submental/submandibular swelling this morning but endorses increased lip swelling. She denies having any difficulty breathing, swallowing, or speaking. Also endorses some purulent,blood tinged drainage from the site overnight. She reports another transient episode of chest pain during the night that quickly resolved.  Objective: Vital signs in last 24 hours: Filed Vitals:   04/12/11 1510 04/12/11 2245 04/13/11 0045 04/13/11 0605  BP:  144/109 107/72 112/79  Pulse:  90 84 84  Temp: 100.7 F (38.2 C) 96.2 F (35.7 C)  99 F (37.2 C)  TempSrc:  Oral  Oral  Resp:  20  18  Height:      Weight:      SpO2:  99%  97%    Intake/Output Summary (Last 24 hours) at 04/13/11 1113 Last data filed at 04/13/11 0900  Gross per 24 hour  Intake   2250 ml  Output      0 ml  Net   2250 ml   Physical Exam: General: In no acute distress, sitting upright in her bed eating breakfast. Mildly distressed talking about her condition. Mouth: pharynx pink with moist mucus membranes without exudates HEENT: Continued swelling on the left side of her mandible extending from the submental to submandibular areas with some crusting and evidence of prior drainage. Tender but not erythematous or hot. Lower lip is swollen worse on the left side compared to her right.  Lungs: Clear to auscultation bilaterally with no evidence of crackles or wheezes. Normal work of breathing. Heart: Regular rate & rhythm without any murmurs rubs or gallops. Was sitting on exam compared to lying yesterday when a 2/6 systolic murmur was appreciated. Abdominal: Large but soft, non-tender and non-distended. No evidence of guarding or rebound pain. Extremities: Mild edema in lower legs and ankles. No evidence of rash/cyanosis/clubbing.  Micro Results: Recent Results (from the past 240 hour(s))  CULTURE, BLOOD (ROUTINE X 2)     Status: Normal (Preliminary result)   Collection Time   04/11/11  10:40 AM      Component Value Range Status Comment   Specimen Description BLOOD RIGHT ARM   Final    Special Requests BOTTLES DRAWN AEROBIC ONLY 4CC   Final    Setup Time [id number]   Final    Culture     Final    Value:        BLOOD CULTURE RECEIVED NO GROWTH TO DATE CULTURE WILL BE HELD FOR 5 DAYS BEFORE ISSUING A FINAL NEGATIVE REPORT   Report Status PENDING   Incomplete   CULTURE, BLOOD (ROUTINE X 2)     Status: Normal (Preliminary result)   Collection Time   04/11/11 10:58 AM      Component Value Range Status Comment   Specimen Description BLOOD RIGHT HAND   Final    Special Requests BOTTLES DRAWN AEROBIC AND ANAEROBIC 10CC   Final    Setup Time [id number]   Final    Culture     Final    Value:        BLOOD CULTURE RECEIVED NO GROWTH TO DATE CULTURE WILL BE HELD FOR 5 DAYS BEFORE ISSUING A FINAL NEGATIVE REPORT   Report Status PENDING   Incomplete   MRSA PCR SCREENING     Status: Normal   Collection Time   04/11/11  4:51 PM      Component Value Range Status Comment   MRSA by PCR NEGATIVE  NEGATIVE  Final    Studies/Results: X-ray Chest Pa And  Lateral   04/11/2011  *RADIOLOGY REPORT*  Clinical Data: Chest pain and shortness of breath.  CHEST - 2 VIEW  Comparison: 03/21/2010  Findings: Heart size and vascularity are normal and the lungs are clear except for a tiny area of scarring in the right midzone.  No significant osseous abnormality.  IMPRESSION: Essentially normal chest.  Original Report Authenticated By: Larey Seat, M.D.   Ct Soft Tissue Neck W Contrast  04/11/2011  *RADIOLOGY REPORT*  Clinical Data: Facial cellulitis.  CT NECK WITH CONTRAST  Technique:  Multidetector CT imaging of the neck was performed with intravenous contrast.  Contrast: 140mL OMNIPAQUE IOHEXOL 300 MG/ML IV SOLN  Comparison: None.  Findings: There is soft tissue stranding anterior to the body of the mandible extending to the right and left of midline.  There is some reactive submandibular  adenopathy.  However, there is no visible deep space infection in the floor of the mouth or elsewhere in the neck.  Submandibular glands and parotid glands are normal. Slight adenopathy in both sides of the neck, greater on the left on the right.  No prevertebral soft tissue swelling.  Thyroid gland is normal.  Lung apices are clear.  Osseous structures of the skull and mandible demonstrate no significant abnormalities.  IMPRESSION: Cellulitis of the soft tissues anterior to the mandible.    No deep space infection.  Original Report Authenticated By: Larey Seat, M.D.   Medications: I have reviewed the patient's current medications. Scheduled Meds:   . ceFEPime (MAXIPIME) IV  1 g Intravenous Q12H  . enoxaparin  40 mg Subcutaneous Q24H  . insulin aspart  0-15 Units Subcutaneous Q4H  . insulin aspart protamine-insulin aspart  12 Units Subcutaneous BID WC  . lisinopril  5 mg Oral Daily  . nicotine  14 mg Transdermal Daily  . simvastatin  20 mg Oral q1800  . vancomycin  1,250 mg Intravenous Q12H  . zolpidem  5 mg Oral Once   Continuous Infusions:   . sodium chloride 150 mL/hr at 04/13/11 0442   PRN Meds:.morphine, ondansetron (ZOFRAN) IV, ondansetron, senna-docusate Assessment/Plan:  Skin/Soft Tissue Infection of lower face/neck: Patient still endorses facial pain along with purulent drainage overnight. Swelling in submandibular and submental areas is decreased compared to yesterday however lower lip swelling has increased. Spiked a fever to 38.2 yesterday however has been afebrile since. Blood cultures remain negative along with negative gonorrhea, chlamydia, and HIV tests.   Will continue vancomycin to cover for possibility for MRSA. Will d/c cefepime and replace with imipenam for broad coverage of gram positives and negatives along with anaerobes. Continue morphine PRN for pain relief. Monitor for signs of sepsis or airway compromise.   Chest Pain: Patient continues to complain of  episodic chest pain and tightness that shoots inferiorly. EKG yesterday only demonstrated mild left axis deviation without evidence of any ischemia. Has history of endocarditis and murmur appreciated on exam yesterday.  Start protonix as empiric therapy for GERD-like symptoms possibly causing her chest pain. Obtain TTE to evaluate murmur with history of infective endocarditis. Last TTE in 2007 demonstrated only mild mitral regurgitation.  DM: CBG's improving with 70/30 insulin and sliding scale.  With patient's history of poor adherence to insulin and other medications will stress the importance of follow up. In addition will have a DM educator counsel her on her disease, medications, and diet.  HTN: Blood pressures remain well controlled over the past 24 hours.   Will continue low dose lisinopril and continue to  monitor her BP's.  Yeast Infection: Yesterday patient reported likely yeast infection to the nurse. Has been on antibiotics and has previously had yeast infections.  Will give single dose of fluconazole 150mg  PO today.   DVT Proph: Lovenox   LOS: 2 days   Marquis Lunch 04/13/2011, 11:13 AM   Gen Med Intern: I have discussed this patient with medical student.  I have seen and examined the patient.  I agree with the entirety of the excellent MS3 note above including all of the above assessment and plan.     Physical Exam: General: alert, well-developed, and cooperative to examination.   Face: Lip swelling increased today, but chin swelling is better. Scant amount of blood tinged purulent drainage present on chin at what appears to be a drainage site.  No tongue swelling.  Pt denies tongue tingling or lip tingling  Lungs: normal respiratory effort, no accessory muscle use, normal breath sounds, no crackles, and no wheezes.  Heart: normal rate, regular rhythm, no murmur, no gallop, and no rub.   Plan:  Cellulitis: Switch cefepime to imipenem in case an allergic reaction is  complicating the picture.  Cont Vanc  DM: Pt has not taken anything for this in 6 months, HbA1c 12.8%. Consulted inpt diabetic educator, and [facility] clinic diabetic educator will also come talk to the patient.  CBGs under good control now.  Cont Lisinopril for renal protection.  CP: TTE, start protonix.

## 2011-04-13 NOTE — Progress Notes (Signed)
Inpatient Diabetes Program Recommendations  AACE/ADA: New Consensus Statement on Inpatient Glycemic Control (2009)  Target Ranges:  Prepandial:   less than 140 mg/dL      Peak postprandial:   less than 180 mg/dL (1-2 hours)      Critically ill patients:  140 - 180 mg/dL   Reason for Visit: A1C=12.8    Patient states that she has not been taking her insulin at home because she can not afford it.  Social work consult ordered for patient assistance. CBGs 154-224 on current regimen.  Will hopefully continue to improve as infection resolves.

## 2011-04-13 NOTE — Progress Notes (Signed)
Visit to patient per request of Dr. [name] to assist with transition to outpatient [facility] services.  Patient looking over materials left on meal planning by Inpatient coordinator. Patient reports her diabetes is important to her and that other psychosocial issues are competing priorities. Informed patient about low cost ReliOn brand insulin, gave her Insulin started pack with 20 syringes and taught reuse guidelines.  Patient reports she has plenty of blood glucose testing supplies.   Plan to make a social work referral as outpatient for assistance with coping with other stressors, a follow up call next week and hospital follow up with CDE.

## 2011-04-13 NOTE — Progress Notes (Signed)
  Echocardiogram 2D Echocardiogram has been performed.  [name] [name] 04/13/2011, 5:25 PM

## 2011-04-13 NOTE — Progress Notes (Addendum)
ANTIBIOTIC CONSULT NOTE - FOLLOW UP  Pharmacy Consult for Vancomycin/Cefepime  Indication: facial abscess  No Known Allergies  Patient Measurements: Height: 5\' 10"  (177.8 cm) Weight: 282 lb 11.2 oz (128.232 kg) IBW/kg (Calculated) : 68.5    Vital Signs: Temp: 99 F (37.2 C) (11/21 0605) Temp src: Oral (11/21 0605) BP: 112/79 mmHg (11/21 0605) Pulse Rate: 84  (11/21 0605) Intake/Output from previous day: 11/20 0701 - 11/21 0700 In: 2060 [P.O.:960; I.V.:1050; IV Piggyback:50] Out: -  Intake/Output from this shift: Total I/O In: 240 [P.O.:240] Out: -   Labs:  Basename 04/12/11 0550 04/11/11 1638 04/11/11 0922 04/11/11 0851  WBC 8.9 9.3 -- 8.7  HGB 11.1* 12.6 13.6 --  PLT 240 230 -- 217  LABCREA -- -- -- --  CREATININE 0.53 0.53 0.80 --   Estimated Creatinine Clearance: 144.5 ml/min (by C-G formula based on Cr of 0.53). No results found for this basename: VANCOTROUGH:2,VANCOPEAK:2,VANCORANDOM:2,GENTTROUGH:2,GENTPEAK:2,GENTRANDOM:2,TOBRATROUGH:2,TOBRAPEAK:2,TOBRARND:2,AMIKACINPEAK:2,AMIKACINTROU:2,AMIKACIN:2, in the last 72 hours   Microbiology: Recent Results (from the past 720 hour(s))  CULTURE, BLOOD (ROUTINE X 2)     Status: Normal (Preliminary result)   Collection Time   04/11/11 10:40 AM      Component Value Range Status Comment   Specimen Description BLOOD RIGHT ARM   Final    Special Requests BOTTLES DRAWN AEROBIC ONLY 4CC   Final    Setup Time [id number]   Final    Culture     Final    Value:        BLOOD CULTURE RECEIVED NO GROWTH TO DATE CULTURE WILL BE HELD FOR 5 DAYS BEFORE ISSUING A FINAL NEGATIVE REPORT   Report Status PENDING   Incomplete   CULTURE, BLOOD (ROUTINE X 2)     Status: Normal (Preliminary result)   Collection Time   04/11/11 10:58 AM      Component Value Range Status Comment   Specimen Description BLOOD RIGHT HAND   Final    Special Requests BOTTLES DRAWN AEROBIC AND ANAEROBIC 10CC   Final    Setup Time [id number]   Final    Culture     Final    Value:        BLOOD CULTURE RECEIVED NO GROWTH TO DATE CULTURE WILL BE HELD FOR 5 DAYS BEFORE ISSUING A FINAL NEGATIVE REPORT   Report Status PENDING   Incomplete   MRSA PCR SCREENING     Status: Normal   Collection Time   04/11/11  4:51 PM      Component Value Range Status Comment   MRSA by PCR NEGATIVE  NEGATIVE  Final     Assessment: 84 yof with hx mrsa here with facial abscess which is improving on cefepime and vancomycin day#3. Excellent renal function given age. Cx data has been negative to date.Will consider vancomycin trough next 1-2 days if therapy is to continue.  Goal of Therapy:  Vancomycin trough level 10-15 mcg/ml  Plan:  Measure antibiotic drug levels at steady state Follow up culture results No dose adjustments in cefepime and vancomycin at this time.  Georgina Peer 04/13/2011,11:29 AM   Addendum: New orders from primary team to expand anaerobic coverage with primaxin 500 mg q 6 hours  If lisinopril is on differential for her lip swelling could could consider changing to losartan?

## 2011-04-14 DIAGNOSIS — L0390 Cellulitis, unspecified: Secondary | ICD-10-CM

## 2011-04-14 DIAGNOSIS — L089 Local infection of the skin and subcutaneous tissue, unspecified: Secondary | ICD-10-CM | POA: Diagnosis present

## 2011-04-14 DIAGNOSIS — L0291 Cutaneous abscess, unspecified: Secondary | ICD-10-CM

## 2011-04-14 LAB — CBC
HCT: 32 % — ABNORMAL LOW (ref 36.0–46.0)
MCHC: 33.8 g/dL (ref 30.0–36.0)
Platelets: 246 10*3/uL (ref 150–400)
RDW: 12.4 % (ref 11.5–15.5)
WBC: 5.7 10*3/uL (ref 4.0–10.5)

## 2011-04-14 LAB — GLUCOSE, CAPILLARY
Glucose-Capillary: 138 mg/dL — ABNORMAL HIGH (ref 70–99)
Glucose-Capillary: 140 mg/dL — ABNORMAL HIGH (ref 70–99)
Glucose-Capillary: 153 mg/dL — ABNORMAL HIGH (ref 70–99)
Glucose-Capillary: 296 mg/dL — ABNORMAL HIGH (ref 70–99)

## 2011-04-14 LAB — BASIC METABOLIC PANEL
BUN: 7 mg/dL (ref 6–23)
GFR calc Af Amer: >90 mL/min (ref >90)
GFR calc non Af Amer: >90 mL/min (ref >90)
Potassium: 4.4 mEq/L (ref 3.5–5.1)
Sodium: 135 mEq/L (ref 135–145)

## 2011-04-14 MED ORDER — SERTRALINE HCL 50 MG PO TABS
50.0000 mg | ORAL_TABLET | Freq: Every day | ORAL | Status: DC
  Administered 2011-04-14 – 2011-04-18 (×5): 50 mg via ORAL
  Filled 2011-04-14 (×6): qty 1

## 2011-04-14 MED ORDER — ZOLPIDEM TARTRATE 5 MG PO TABS
5.0000 mg | ORAL_TABLET | Freq: Every evening | ORAL | Status: DC | PRN
  Administered 2011-04-14 – 2011-04-18 (×5): 5 mg via ORAL
  Filled 2011-04-14 (×5): qty 1

## 2011-04-14 NOTE — Progress Notes (Addendum)
Subjective: Patient reports a new lump of pain in R jawline.  Otherwise she thinks the pain is doing well.  No fevers overnight.    She also reports depressed mood that has been a long term problem for her.  She went [facility] the state hospital at [facility] as a teenager, but since her teenage years she has not seeked care for mental health.  Due to stressors at home (losing her job, caring for her mother), she believes that worsening mental health has played a role in her poor self-care/diabetes management.  She repeatedly denies SI/HI.  Objective: Vital signs in last 24 hours: Filed Vitals:   04/13/11 0605 04/13/11 1500 04/13/11 2115 04/14/11 0500  BP: 112/79 118/82 110/79 115/82  Pulse: 84 85 80 86  Temp: 99 F (37.2 C) 98.3 F (36.8 C) 97.8 F (36.6 C) 98.5 F (36.9 C)  TempSrc: Oral Oral Oral Oral  Resp: 18 18 18 20   Height:      Weight:      SpO2: 97% 97% 95% 96%   Physical Exam: General: alert, well-developed, and cooperative to examination.   Face: Continued swelling on the left side of her mandible extending from the submental to submandibular areas with some crusting and evidence of current drainage. Tender but not erythematous or hot. Lower lip is swollen but less tight appearing than yesterday. New lymph node palpable along R jawline.    Lungs: normal respiratory effort, no accessory muscle use, normal breath sounds, no crackles, and no wheezes. Heart: normal rate, regular rhythm, no murmur, no gallop, and no rub.   Psych: Oriented X3.  Tearful affect when describing her depression.       Lab Results: Basic Metabolic Panel:  Lab [id number] 04/11/11 1638 04/11/11 0922 04/11/11 0851  NA 134* -- 135 --  K 3.8 -- 4.7 --  CL 103 -- 100 --  CO2 23 -- -- 26  GLUCOSE 194* -- 393* --  BUN 9 -- 14 --  CREATININE 0.53 0.53 -- --  CALCIUM 8.5 -- -- 9.3  MG -- -- -- --  PHOS -- -- -- --   CBC:  Lab 04/14/11 0845 04/12/11 0550 04/11/11 0851  WBC 5.7 8.9 --  NEUTROABS --  5.8 6.3  HGB 10.8* 11.1* --  HCT 32.0* 31.8* --  MCV 79.8 78.3 --  PLT 246 240 --   Cardiac Enzymes:  Lab 04/11/11 1638  CKTOTAL 128  CKMB 2.0  CKMBINDEX --  TROPONINI <0.30   CBG:  Lab 04/14/11 0738 04/14/11 0343 04/14/11 0042 04/13/11 2059 04/13/11 1617 04/13/11 1159  GLUCAP 153* 140* 138* 117* 263* 213*   Hemoglobin A1C:  Lab 04/11/11 1638  HGBA1C 12.8*   Fasting Lipid Panel:  Lab 04/12/11 0550  CHOL 174  HDL 42  LDLCALC 104*  TRIG 138  CHOLHDL 4.1  LDLDIRECT --    Micro Results: Recent Results (from the past 240 hour(s))  CULTURE, BLOOD (ROUTINE X 2)     Status: Normal (Preliminary result)   Collection Time   04/11/11 10:40 AM      Component Value Range Status Comment   Specimen Description BLOOD RIGHT ARM   Final    Special Requests BOTTLES DRAWN AEROBIC ONLY 4CC   Final    Setup Time [id number]   Final    Culture     Final    Value:        BLOOD CULTURE RECEIVED NO GROWTH TO DATE CULTURE WILL BE HELD FOR 5 DAYS  BEFORE ISSUING A FINAL NEGATIVE REPORT   Report Status PENDING   Incomplete   CULTURE, BLOOD (ROUTINE X 2)     Status: Normal (Preliminary result)   Collection Time   04/11/11 10:58 AM      Component Value Range Status Comment   Specimen Description BLOOD RIGHT HAND   Final    Special Requests BOTTLES DRAWN AEROBIC AND ANAEROBIC 10CC   Final    Setup Time [id number]   Final    Culture     Final    Value:        BLOOD CULTURE RECEIVED NO GROWTH TO DATE CULTURE WILL BE HELD FOR 5 DAYS BEFORE ISSUING A FINAL NEGATIVE REPORT   Report Status PENDING   Incomplete   MRSA PCR SCREENING     Status: Normal   Collection Time   04/11/11  4:51 PM      Component Value Range Status Comment   MRSA by PCR NEGATIVE  NEGATIVE  Final    Medications: I have reviewed the patient's current medications. Scheduled Meds:   . enoxaparin  40 mg Subcutaneous Q24H  . fluconazole  150 mg Oral Once  . imipenem-cilastatin  500 mg Intravenous Q6H  . insulin aspart   0-15 Units Subcutaneous Q4H  . insulin aspart protamine-insulin aspart  12 Units Subcutaneous BID WC  . lisinopril  5 mg Oral Daily  . living well with diabetes book   Does not apply Once  . nicotine  14 mg Transdermal Daily  . pantoprazole  40 mg Oral Q1200  . simvastatin  20 mg Oral q1800  . vancomycin  1,250 mg Intravenous Q12H  . zolpidem  5 mg Oral Once  . DISCONTD: ceFEPime (MAXIPIME) IV  1 g Intravenous Q12H   Continuous Infusions:   . DISCONTD: sodium chloride 150 mL/hr at 04/13/11 1303   PRN Meds:.morphine, ondansetron (ZOFRAN) IV, ondansetron, senna-docusate  Assessment/Plan: Active Problems: Skin/Soft Tissue Infection of lower face/neck: Patient still endorses facial pain along with continued purulent drainage overnight. Swelling appears a bit improved today.  New palpable lymph node along R jawline that concerns her.  Spiked a fever to 38.2 two days ago however has been afebrile since. Blood cultures remain negative along with negative gonorrhea, chlamydia, and HIV tests.   Will continue vancomycin to cover for possibility for MRSA. Cont imipenam for broad coverage of gram positives and negatives along with anaerobes.  Continue morphine PRN for pain relief. Monitor for signs of sepsis or airway compromise.    Chest Pain: Patient has complained of episodic chest pain and tightness that shoots inferiorly. EKG only demonstrated mild left axis deviation without evidence of any ischemia. Has history of endocarditis and murmur appreciated on exam two days ago that has not been heard since.  TTE only showed mild LVH, no sign of valvular disease.  Normal EF.   Cont protonix as empiric therapy for GERD-like symptoms possibly causing her chest pain.   DM: CBG's improving with 70/30 insulin and sliding scale.  Seen by inpt diabetic counselor and diabetic counselor from [facility]  This is very important issue on follow-up. -Cont lisinopril for renal protection  HTN: Blood pressures remain  well controlled over the past 24 hours.  Will continue low dose lisinopril and continue to monitor her BP's.   Yeast Infection: Patient reports subjected improvement after fluconazole.  Will cont to monitor  Depression: Patient gives a history (new to [place] today) of mental health visits to the state hospital and [facility]  in her teenage yrs, but denies seeking mental health care since.  She became tearful today describing her depression worsening in recent months due to tough situation at home (caring for her mom, losing her job, seeing her friends at her age have things she doesn't).  She has depressed mood, anhedonia, sleep troubles, feelings of guilt.  Repeatedly denies suicidal ideation.  Will start SSRI during hospitalization and get her mental health care with help of social worker in our clinic on her follow-up with [facility] clinic.    -Will start Zoloft today  DVT Proph: Lovenox     LOS: 3 days   [name], BRAD 04/14/2011, 9:06 AM

## 2011-04-15 LAB — GLUCOSE, CAPILLARY
Glucose-Capillary: 112 mg/dL — ABNORMAL HIGH (ref 70–99)
Glucose-Capillary: 230 mg/dL — ABNORMAL HIGH (ref 70–99)
Glucose-Capillary: 233 mg/dL — ABNORMAL HIGH (ref 70–99)
Glucose-Capillary: 234 mg/dL — ABNORMAL HIGH (ref 70–99)

## 2011-04-15 LAB — VANCOMYCIN, TROUGH: Vancomycin Tr: 9.1 ug/mL — ABNORMAL LOW (ref 10.0–20.0)

## 2011-04-15 MED ORDER — INSULIN ASPART PROT & ASPART (70-30 MIX) 100 UNIT/ML ~~LOC~~ SUSP
15.0000 [IU] | Freq: Two times a day (BID) | SUBCUTANEOUS | Status: DC
  Administered 2011-04-15 – 2011-04-18 (×7): 15 [IU] via SUBCUTANEOUS

## 2011-04-15 MED ORDER — VANCOMYCIN HCL 1000 MG IV SOLR
1500.0000 mg | Freq: Two times a day (BID) | INTRAVENOUS | Status: DC
  Administered 2011-04-16 – 2011-04-18 (×5): 1500 mg via INTRAVENOUS
  Filled 2011-04-15 (×6): qty 1500

## 2011-04-15 NOTE — Progress Notes (Addendum)
Received referral from [place] re assistance with medications, as original referral sent inappropriately to CSW.  Pt is eligible for assist with a 3 days supple of meds from hospital pharmacy. Will also attempt to assist pt with application for assistance with meds from manufacture when d/c meds are known.  Unable to provide pain medications, however some assistance with antibiotics, ? Insulin may be available.  Jasmine Pang RN MPH [phone] Met with pt to discuss d/c needs, pt agreed to allowing this CM to fax info to [facility] for follow up in the community on healthcare and medication assistance, info sent.  Will assist with 3 day supply of meds and hopefully the full supple of antibiotics at time of d/c.  Will provide pt with manufactures application for Insulin assistance at time of d/c and pt will complete when d/c meds are determined.  Per pt, she will followup in Clinic, if so, she should be able to obtain her orange care to use at [employer].  Adron Bene RN MPH Case Manager [phone]

## 2011-04-15 NOTE — Progress Notes (Addendum)
ANTIBIOTIC CONSULT NOTE - FOLLOW UP  Pharmacy Consult for Vancomycin / Primaxin Indication: facial abscess  No Known Allergies  Patient Measurements: Height: 5\' 10"  (177.8 cm) Weight: 282 lb 11.2 oz (128.232 kg) IBW/kg (Calculated) : 68.5    Vital Signs: Temp: 99 F (37.2 C) (11/23 1420) BP: 152/98 mmHg (11/23 1420) Pulse Rate: 84  (11/23 1420) Intake/Output from previous day:   Intake/Output from this shift: Total I/O In: 350 [P.O.:350] Out: 402 [Urine:402]  Labs:  [facility] 04/14/11 0845  WBC 5.7  HGB 10.8*  PLT 246  LABCREA --  CREATININE 0.54   Estimated Creatinine Clearance: 144.5 ml/min (by C-G formula based on Cr of 0.54).  Basename 04/15/11 1720  VANCOTROUGH 9.1*  VANCOPEAK --  Jake Michaelis --  GENTTROUGH --  GENTPEAK --  GENTRANDOM --  TOBRATROUGH --  TOBRAPEAK --  TOBRARND --  AMIKACINPEAK --  AMIKACINTROU --  AMIKACIN --     Microbiology: Recent Results (from the past 720 hour(s))  CULTURE, BLOOD (ROUTINE X 2)     Status: Normal (Preliminary result)   Collection Time   04/11/11 10:40 AM      Component Value Range Status Comment   Specimen Description BLOOD RIGHT ARM   Final    Special Requests BOTTLES DRAWN AEROBIC ONLY 4CC   Final    Setup Time [id number]   Final    Culture     Final    Value:        BLOOD CULTURE RECEIVED NO GROWTH TO DATE CULTURE WILL BE HELD FOR 5 DAYS BEFORE ISSUING A FINAL NEGATIVE REPORT   Report Status PENDING   Incomplete   CULTURE, BLOOD (ROUTINE X 2)     Status: Normal (Preliminary result)   Collection Time   04/11/11 10:58 AM      Component Value Range Status Comment   Specimen Description BLOOD RIGHT HAND   Final    Special Requests BOTTLES DRAWN AEROBIC AND ANAEROBIC 10CC   Final    Setup Time [id number]   Final    Culture     Final    Value:        BLOOD CULTURE RECEIVED NO GROWTH TO DATE CULTURE WILL BE HELD FOR 5 DAYS BEFORE ISSUING A FINAL NEGATIVE REPORT   Report Status PENDING   Incomplete     MRSA PCR SCREENING     Status: Normal   Collection Time   04/11/11  4:51 PM      Component Value Range Status Comment   MRSA by PCR NEGATIVE  NEGATIVE  Final     Assessment: 35 yo female with h/o MRSA with facial abscess. Patient is on D5 vancomycin and D3 imipenem. Vancomycin trough is slightly below-goal.   Goal of Therapy:  Vancomycin trough level 10-15 mcg/ml  Plan:  1. Increase vancomycin to 1500mg  IV Q12H. 2. Continue Primaxin 500mg  IV Q6H.   Otila Back 04/15/2011,6:33 PM

## 2011-04-15 NOTE — Progress Notes (Signed)
Covering Clinical Social Worker observed referral that pt. Cannot afford mediations however referral needs to be given to Case Management. CSW will inform CM. CSW is signing off but is available to assist if additional needs are addressed. Hunt Oris, MSW, [place]

## 2011-04-15 NOTE — Progress Notes (Signed)
Subjective: No fevers overnight or chills.  Pain under good control.  Patient concerned that R sided chin lump becoming more prominent and L lip and chin becoming harder feeling.    Objective: Vital signs in last 24 hours: Filed Vitals:   04/14/11 0500 04/14/11 1603 04/14/11 2200 04/15/11 0534  BP: 115/82 118/78 117/71 128/91  Pulse: 86 84 79 82  Temp: 98.5 F (36.9 C) 97.1 F (36.2 C) 97.4 F (36.3 C) 97.7 F (36.5 C)  TempSrc: Oral Oral Axillary Oral  Resp: 20 18 18 20   Height:      Weight:      SpO2: 96% 98% 95% 98%   Physical Exam: General: alert, well-developed, and cooperative to examination.   Face: Continued swelling on the left side of her mandible extending from the submental to submandibular areas with some crusting and evidence of current drainage. Firmer feeling over left lip and chin today, but not very tender.  Palpation lump, likely lymph node, in R jawline is a bit bigger today.  Lungs: normal respiratory effort, no accessory muscle use, normal breath sounds, no crackles, and no wheezes. Heart: normal rate, regular rhythm. 2/6 systolic murmur heard again today.   Lab Results: Basic Metabolic Panel:  Lab [id number] 0845 04/12/11 0550  NA 135 134*  K 4.4 3.8  CL 103 103  CO2 26 23  GLUCOSE 152* 194*  BUN 7 9  CREATININE 0.54 0.53  CALCIUM 8.9 8.5  MG -- --  PHOS -- --   CBC:  Lab 04/14/11 0845 04/12/11 0550 04/11/11 0851  WBC 5.7 8.9 --  NEUTROABS -- 5.8 6.3  HGB 10.8* 11.1* --  HCT 32.0* 31.8* --  MCV 79.8 78.3 --  PLT 246 240 --   Cardiac Enzymes:  Lab 04/11/11 1638  CKTOTAL 128  CKMB 2.0  CKMBINDEX --  TROPONINI <0.30   CBG:  Lab 04/15/11 0514 04/14/11 2353 04/14/11 2019 04/14/11 1559 04/14/11 1258 04/14/11 0738  GLUCAP 221* 234* 143* 296* 143* 153*   Hemoglobin A1C:  Lab 04/11/11 1638  HGBA1C 12.8*   Fasting Lipid Panel:  Lab 04/12/11 0550  CHOL 174  HDL 42  LDLCALC 104*  TRIG 138  CHOLHDL 4.1  LDLDIRECT --   Urine  Drug Screen: Drugs of Abuse     Component Value Date/Time   LABOPIA NONE DETECTED 04/11/2011 1656   COCAINSCRNUR NONE DETECTED 04/11/2011 1656   LABBENZ NONE DETECTED 04/11/2011 1656   AMPHETMU NONE DETECTED 04/11/2011 1656   THCU NONE DETECTED 04/11/2011 1656   LABBARB NONE DETECTED 04/11/2011 1656      Micro Results: Recent Results (from the past 240 hour(s))  CULTURE, BLOOD (ROUTINE X 2)     Status: Normal (Preliminary result)   Collection Time   04/11/11 10:40 AM      Component Value Range Status Comment   Specimen Description BLOOD RIGHT ARM   Final    Special Requests BOTTLES DRAWN AEROBIC ONLY 4CC   Final    Setup Time [id number]   Final    Culture     Final    Value:        BLOOD CULTURE RECEIVED NO GROWTH TO DATE CULTURE WILL BE HELD FOR 5 DAYS BEFORE ISSUING A FINAL NEGATIVE REPORT   Report Status PENDING   Incomplete   CULTURE, BLOOD (ROUTINE X 2)     Status: Normal (Preliminary result)   Collection Time   04/11/11 10:58 AM      Component Value Range Status Comment  Specimen Description BLOOD RIGHT HAND   Final    Special Requests BOTTLES DRAWN AEROBIC AND ANAEROBIC 10CC   Final    Setup Time [id number]   Final    Culture     Final    Value:        BLOOD CULTURE RECEIVED NO GROWTH TO DATE CULTURE WILL BE HELD FOR 5 DAYS BEFORE ISSUING A FINAL NEGATIVE REPORT   Report Status PENDING   Incomplete   MRSA PCR SCREENING     Status: Normal   Collection Time   04/11/11  4:51 PM      Component Value Range Status Comment   MRSA by PCR NEGATIVE  NEGATIVE  Final    Medications: I have reviewed the patient's current medications. Scheduled Meds:   . enoxaparin  40 mg Subcutaneous Q24H  . imipenem-cilastatin  500 mg Intravenous Q6H  . insulin aspart  0-15 Units Subcutaneous Q4H  . insulin aspart protamine-insulin aspart  12 Units Subcutaneous BID WC  . lisinopril  5 mg Oral Daily  . living well with diabetes book   Does not apply Once  . nicotine  14 mg Transdermal  Daily  . pantoprazole  40 mg Oral Q1200  . sertraline  50 mg Oral Daily  . simvastatin  20 mg Oral q1800  . vancomycin  1,250 mg Intravenous Q12H   Continuous Infusions:  PRN Meds:.morphine, ondansetron (ZOFRAN) IV, ondansetron, senna-docusate, zolpidem   Assessment/Plan:   Skin/Soft Tissue Infection of lower face/neck: Patient still endorses facial pain along with continued purulent drainage overnight. Lip and chin feel more indurated/hard than yesterday, but swelling not increased and not warmer than yesterday. R jawline lymph node a bit larger. Afebrile since yest mornign.  Blood cultures remain negative along with negative gonorrhea, chlamydia, and HIV tests.  Will continue vancomycin to cover for possibility for MRSA. Cont imipenam for broad coverage of gram positives and negatives along with anaerobes.  Continue morphine PRN for pain relief. Monitor for signs of sepsis or airway compromise.  Consider ENT consult pending discussion of Ms. Flink with my team   Chest Pain: No chest pain overnight.  Seems to have improved since starting PPI.  EKG only demonstrated mild left axis deviation without evidence of any ischemia. Has history of endocarditis and murmur appreciated on exam two days ago that has not been heard since. TTE only showed mild LVH, no sign of valvular disease. Normal EF.   Cont protonix as empiric therapy for GERD-like symptoms possibly causing her chest pain.   DM: CBG's improved with 70/30 insulin and sliding scale.  Seen by inpt diabetic counselor and diabetic counselor from [facility]. This is very important issue on follow-up.  -Cont lisinopril for renal protection   HTN: Blood pressures remain well controlled over the past 24 hours.  Will continue low dose lisinopril and continue to monitor her BP's.   Yeast Infection: Patient reports subjected improvement after fluconazole x 1. Will cont to monitor   Depression: Patient gives a history of mental health visits [facility] and [facility] in her teenage yrs, but denies seeking mental health care since. She became tearful describing her depression worsening in recent months due to tough situation at home (caring for her mom, losing her job, seeing her friends at her age have things she doesn't). She has depressed mood, anhedonia, sleep troubles, feelings of guilt. Repeatedly denies suicidal ideation. Will start SSRI during hospitalization and get her mental health care with help of social  worker in our clinic on her follow-up with [facility] clinic.  -Will continue Zoloft, too soon to evaluate its efficacy  DVT Proph: Lovenox   LOS: 4 days   [name], BRAD 04/15/2011, 7:43 AM

## 2011-04-16 DIAGNOSIS — L03211 Cellulitis of face: Secondary | ICD-10-CM

## 2011-04-16 DIAGNOSIS — L0201 Cutaneous abscess of face: Secondary | ICD-10-CM

## 2011-04-16 LAB — GLUCOSE, CAPILLARY
Glucose-Capillary: 140 mg/dL — ABNORMAL HIGH (ref 70–99)
Glucose-Capillary: 122 mg/dL — ABNORMAL HIGH (ref 70–99)
Glucose-Capillary: 109 mg/dL — ABNORMAL HIGH (ref 70–99)

## 2011-04-16 LAB — CBC
HCT: 23.8 % — ABNORMAL LOW (ref 36.0–46.0)
Hemoglobin: 8.7 g/dL — ABNORMAL LOW (ref 12.0–15.0)
MCH: 29.5 pg (ref 26.0–34.0)
MCHC: 36.6 g/dL — ABNORMAL HIGH (ref 30.0–36.0)

## 2011-04-16 NOTE — Consult Note (Signed)
Reason for Consult: Facial cellulitis/abscess Referring Physician: Providence Lanius, MD  HPI:  [name] [name] is an 35 y.o. female. Who was admitted on 04/11/11 for treatment of her facial cellulitis. 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 She states that 3 days prior to admission she woke up and noted some soreness and a small swollen area on her left lower chin. She attempted to apply warm compresses to alleviate the pain but states this only worsened the swelling and increased her pain. She states these symptoms are similar to soft tissue infection she has experienced in the past. She notes the area drained a small amount of clear fluid and one day prior to arrival. She denies difficulty breathing or difficulty swallowing. She denies any recent dental work, or problems with dental cavities/abscesses.  The facial swelling has significantly decreased. She has been experiencing purulent drainage from the left lower chin.  However, she complains that the area has hardened.  The pain has decreased.   Past Medical History  Diagnosis Date  . Diabetes mellitus     Type 2  . Hyperlipidemia   . Hypertension     History reviewed. No pertinent past surgical history.  Family History  Problem Relation Age of Onset  . Heart failure Mother   . Diabetes Mother   . Kidney disease Mother   . Kidney disease Father     Social History:  reports that she has been smoking.  She does not have any smokeless tobacco history on file. She reports that she does not drink alcohol or use illicit drugs.  Allergies: No Known Allergies  Medications:  I have reviewed the patient's current medications. Prior to Admission:  No prescriptions prior to admission   Scheduled:   . enoxaparin  40 mg Subcutaneous Q24H  . imipenem-cilastatin  500 mg  Intravenous Q6H  . insulin aspart  0-15 Units Subcutaneous Q4H  . insulin aspart protamine-insulin aspart  15 Units Subcutaneous BID WC  . lisinopril  5 mg Oral Daily  . living well with diabetes book   Does not apply Once  . nicotine  14 mg Transdermal Daily  . pantoprazole  40 mg Oral Q1200  . sertraline  50 mg Oral Daily  . simvastatin  20 mg Oral q1800  . vancomycin  1,500 mg Intravenous Q12H  . DISCONTD: vancomycin  1,250 mg Intravenous Q12H   Continuous:  [id number], ondansetron (ZOFRAN) IV, ondansetron, senna-docusate, zolpidem  Results for orders placed during the hospital encounter of 04/11/11 (from the past 48 hour(s))  GLUCOSE, CAPILLARY     Status: Abnormal   Collection Time   04/14/11 12:58 PM      Component Value Range Comment   Glucose-Capillary 143 (*) 70 - 99 (mg/dL)   GLUCOSE, CAPILLARY     Status: Abnormal   Collection Time   04/14/11  3:59 PM      Component Value Range Comment   Glucose-Capillary 296 (*) 70 - 99 (mg/dL)   GLUCOSE, CAPILLARY     Status: Abnormal   Collection Time   04/14/11  8:19 PM      Component Value Range Comment   Glucose-Capillary 143 (*) 70 - 99 (mg/dL)    Comment 1 Documented in Chart      Comment 2 Notify RN     GLUCOSE, CAPILLARY     Status: Abnormal   Collection Time  04/14/11 11:53 PM      Component Value Range Comment   Glucose-Capillary 234 (*) 70 - 99 (mg/dL)    Comment 1 Notify RN     GLUCOSE, CAPILLARY     Status: Abnormal   Collection Time   04/15/11  5:14 AM      Component Value Range Comment   Glucose-Capillary 221 (*) 70 - 99 (mg/dL)   GLUCOSE, CAPILLARY     Status: Abnormal   Collection Time   04/15/11  8:12 AM      Component Value Range Comment   Glucose-Capillary 233 (*) 70 - 99 (mg/dL)    Comment 1 Notify RN     GLUCOSE, CAPILLARY     Status: Abnormal   Collection Time   04/15/11 12:12 PM      Component Value Range Comment   Glucose-Capillary 182 (*) 70 - 99 (mg/dL)   GLUCOSE, CAPILLARY     Status:  Abnormal   Collection Time   04/15/11  4:28 PM      Component Value Range Comment   Glucose-Capillary 230 (*) 70 - 99 (mg/dL)    Comment 1 Notify RN     VANCOMYCIN, TROUGH     Status: Abnormal   Collection Time   04/15/11  5:20 PM      Component Value Range Comment   Vancomycin Tr 9.1 (*) 10.0 - 20.0 (ug/mL)   GLUCOSE, CAPILLARY     Status: Abnormal   Collection Time   04/15/11  8:12 PM      Component Value Range Comment   Glucose-Capillary 112 (*) 70 - 99 (mg/dL)    Comment 1 Documented in Chart      Comment 2 Notify RN     GLUCOSE, CAPILLARY     Status: Abnormal   Collection Time   04/15/11 11:57 PM      Component Value Range Comment   Glucose-Capillary 146 (*) 70 - 99 (mg/dL)    Comment 1 Notify RN     GLUCOSE, CAPILLARY     Status: Abnormal   Collection Time   04/16/11  4:23 AM      Component Value Range Comment   Glucose-Capillary 140 (*) 70 - 99 (mg/dL)    Comment 1 Notify RN     CBC     Status: Abnormal   Collection Time   04/16/11  6:00 AM      Component Value Range Comment   WBC QUANTITY NOT SUFFICIENT, UNABLE TO PERFORM TEST  4.0 - 10.5 (K/uL)    RBC 2.95 (*) 3.87 - 5.11 (MIL/uL)    Hemoglobin 8.7 (*) 12.0 - 15.0 (g/dL)    HCT 23.8 (*) 36.0 - 46.0 (%)    MCV 80.7  78.0 - 100.0 (fL)    MCH 29.5  26.0 - 34.0 (pg)    MCHC 36.6 (*) 30.0 - 36.0 (g/dL)    RDW 12.4  11.5 - 15.5 (%)    Platelets 206  150 - 400 (K/uL)   GLUCOSE, CAPILLARY     Status: Abnormal   Collection Time   04/16/11  8:26 AM      Component Value Range Comment   Glucose-Capillary 122 (*) 70 - 99 (mg/dL)    Comment 1 Documented in Chart      Comment 2 Notify RN       No results found.  Review of Systems:  As noted above.   Blood pressure 110/82, pulse 79, temperature 98.2 F (36.8 C), temperature source Oral, resp.  rate 20, height 5\' 10"  (1.778 m), weight 128.232 kg (282 lb 11.2 oz), last menstrual period 03/24/2011, SpO2 94.00%.  Physical Exam: GEN: No acute distress. A&Ox3. HEENT:  Her pupils are equal, round, reactive to light. Extraocular motion is intact. Examination of the ears shows normal auricles and external auditory canals bilaterally.  Nasal examination shows normal mucosa, septum, turbinates. Facial examination shows swelling and edema of the left lower lip and lower chin.  Palpable induration present involving lower lip, chin, and the submental area. A small wound is noted at the left lower chin. No palpable fluctuance is noted. Oral cavity examination shows no mucosal abnormality. No significant trismus is noted. Palpation of the neck reveals several small lymphadenopathy. The trachea is midline. The thyroid is not significantly enlarged. Cranial nerves 2-12 are all grossly in tact. NEURO: CN II-XII grossly intact. No focal deficit.  Assessment/Plan: Facial cellulitis.  No drainable abscess noted on physical exam.  Continue with IV abx (Vanc and Primaxin).  Will follow.  Annaly Skop,SUI W 04/16/2011, 9:27 AM

## 2011-04-16 NOTE — Progress Notes (Signed)
Subjective: Minimal pain.  Believes swelling is improving.  No more episodes of chest tightness.  Eating well.   Objective: Vital signs in last 24 hours: Filed Vitals:   04/15/11 1045 04/15/11 1420 04/15/11 2150 04/16/11 0616  BP: 120/80 152/98 128/83 110/82  Pulse:  84 83 79  Temp:  99 F (37.2 C) 96.3 F (35.7 C) 98.2 F (36.8 C)  TempSrc:   Axillary Oral  Resp:  20 20 20   Height:      Weight:      SpO2:  97% 96% 94%   Weight change:   Intake/Output Summary (Last 24 hours) at 04/16/11 0952 Last data filed at 04/15/11 1700  Gross per 24 hour  Intake    590 ml  Output    401 ml  Net    189 ml   Physical Exam: General: alert, well-developed, and cooperative to examination.  Face: Continued swelling on the left side of her mandible extending from the submental to submandibular areas with some crusting and evidence of current drainage.  Swelling of lip and chin improved since yesterday. Palpation lump, likely lymph node, in R jawline is smaller today as well.  Multiple small blistering lesions on R lip noticed for first time today.     Lungs: normal respiratory effort, no accessory muscle use, normal breath sounds, no crackles, and no wheezes. Heart: normal rate, regular rhythm. No m/r/g today     Lab Results: Basic Metabolic Panel:  Lab [id number] 0845 04/12/11 0550  NA 135 134*  K 4.4 3.8  CL 103 103  CO2 26 23  GLUCOSE 152* 194*  BUN 7 9  CREATININE 0.54 0.53  CALCIUM 8.9 8.5  MG -- --  PHOS -- --   CBC:  Lab 04/16/11 0600 04/14/11 0845 04/12/11 0550 04/11/11 0851  WBC QUANTITY NOT SUFFICIENT, UNABLE TO PERFORM TEST 5.7 -- --  NEUTROABS -- -- 5.8 6.3  HGB 8.7* 10.8* -- --  HCT 23.8* 32.0* -- --  MCV 80.7 79.8 -- --  PLT 206 246 -- --   Cardiac Enzymes:  Lab 04/11/11 1638  CKTOTAL 128  CKMB 2.0  CKMBINDEX --  TROPONINI <0.30   CBG:  Lab 04/16/11 0826 04/16/11 0423 04/15/11 2357 04/15/11 2012 04/15/11 1628 04/15/11 1212  GLUCAP 122* 140* 146*  112* 230* 182*   Hemoglobin A1C:  Lab 04/11/11 1638  HGBA1C 12.8*   Fasting Lipid Panel:  Lab 04/12/11 0550  CHOL 174  HDL 42  LDLCALC 104*  TRIG 138  CHOLHDL 4.1  LDLDIRECT --   Urine Drug Screen: Drugs of Abuse     Component Value Date/Time   LABOPIA NONE DETECTED 04/11/2011 1656   COCAINSCRNUR NONE DETECTED 04/11/2011 1656   LABBENZ NONE DETECTED 04/11/2011 1656   AMPHETMU NONE DETECTED 04/11/2011 1656   THCU NONE DETECTED 04/11/2011 1656   LABBARB NONE DETECTED 04/11/2011 1656     Micro Results: Recent Results (from the past 240 hour(s))  CULTURE, BLOOD (ROUTINE X 2)     Status: Normal (Preliminary result)   Collection Time   04/11/11 10:40 AM      Component Value Range Status Comment   Specimen Description BLOOD RIGHT ARM   Final    Special Requests BOTTLES DRAWN AEROBIC ONLY 4CC   Final    Setup Time [id number]   Final    Culture     Final    Value:        BLOOD CULTURE RECEIVED NO GROWTH TO DATE CULTURE WILL  BE HELD FOR 5 DAYS BEFORE ISSUING A FINAL NEGATIVE REPORT   Report Status PENDING   Incomplete   CULTURE, BLOOD (ROUTINE X 2)     Status: Normal (Preliminary result)   Collection Time   04/11/11 10:58 AM      Component Value Range Status Comment   Specimen Description BLOOD RIGHT HAND   Final    Special Requests BOTTLES DRAWN AEROBIC AND ANAEROBIC 10CC   Final    Setup Time [id number]   Final    Culture     Final    Value:        BLOOD CULTURE RECEIVED NO GROWTH TO DATE CULTURE WILL BE HELD FOR 5 DAYS BEFORE ISSUING A FINAL NEGATIVE REPORT   Report Status PENDING   Incomplete   MRSA PCR SCREENING     Status: Normal   Collection Time   04/11/11  4:51 PM      Component Value Range Status Comment   MRSA by PCR NEGATIVE  NEGATIVE  Final    Medications: I have reviewed the patient's current medications. Scheduled Meds:   . enoxaparin  40 mg Subcutaneous Q24H  . imipenem-cilastatin  500 mg Intravenous Q6H  . insulin aspart  0-15 Units  Subcutaneous Q4H  . insulin aspart protamine-insulin aspart  15 Units Subcutaneous BID WC  . lisinopril  5 mg Oral Daily  . living well with diabetes book   Does not apply Once  . nicotine  14 mg Transdermal Daily  . pantoprazole  40 mg Oral Q1200  . sertraline  50 mg Oral Daily  . simvastatin  20 mg Oral q1800  . vancomycin  1,500 mg Intravenous Q12H  . DISCONTD: vancomycin  1,250 mg Intravenous Q12H   Continuous Infusions:  PRN Meds:.morphine, ondansetron (ZOFRAN) IV, ondansetron, senna-docusate, zolpidem   Assessment/Plan:   Skin/Soft Tissue Infection of lower face/neck: Improved significantly over the past two days.  Afebrile.   Will continue IV abx Vanc and Primaxin since responding well.  Will continue narrowing these for anticipated discharge tomorrow or Monday. Greatly appreciate input from ENT consultant Dr. Benjamine Mola.  Chest Tightness: No chest tightness last two days. Seems to have improved since starting PPI. EKG only demonstrated mild left axis deviation without evidence of any ischemia. Has history of endocarditis and murmur appreciated on exam two days ago that has not been heard since. TTE only showed mild LVH, no sign of valvular disease. Normal EF.  Cont protonix as empiric therapy for GERD-like symptoms possibly causing her chest pain.   DM: CBG's improved with 70/30 insulin and sliding scale.  Seen by inpt diabetic counselor and diabetic counselor from [facility]. This is very important issue on follow-up.  -Cont lisinopril for renal protection  -Cont tight glucose control which should improve her healing  HTN: Blood pressures remain well controlled. Will continue low dose lisinopril and continue to monitor her BP's.   Depression: Patient gives a history of mental health visits to the state [facility] and [facility] in her teenage yrs, but denies seeking mental health care since. She became tearful describing her depression worsening in recent months due to tough situation at home  (caring for her mom, losing her job, seeing her friends at her age have things she doesn't). She has depressed mood, anhedonia, sleep troubles, feelings of guilt. Repeatedly denies suicidal ideation. Will start SSRI during hospitalization and get her mental health care with help of social worker in our clinic on her follow-up with [facility] clinic.  -Will continue  Zoloft, too soon to evaluate its efficacy   DVT Proph: Lovenox   LOS: 5 days   [name], BRAD 04/16/2011, 9:52 AM

## 2011-04-16 NOTE — Consult Note (Signed)
Date of Admission:  04/11/2011  Date of Consult:  04/16/2011  Reason for Consult:Soft tissue infection of chin Referring Physician: Dr. Sheela Stack [name] [name] is an 35 y.o. female. 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   Upon arrival she was treated broadly with vancomycin and Zosyn then vancomycin and cefepime MR recently vancomycin and imipenem.  Most recent change was made because the patient was having swelling of her lip and there is concern for possibility of cephalosporin-induced  Allergy.  He's had CT scan performed which has failed to show an obvious abscess in the face or neck.  He is being followed by ear nose and throat surgery as well. She seems to be responding well to her broad-spectrum antibiotics in the form of vancomycin and now independent.  I was asked to see the patient in consultation to help assist in the management and workup of this skin infection and particular to consider what optimal antibiotics could be should be continued at this point in time.  Past Medical History  Diagnosis Date  . Diabetes mellitus     Type 2  . Hyperlipidemia   . Hypertension   MRSA soft tissue infections GBS endocarditis  History reviewed. No pertinent past surgical history.ergies:   No Known Allergies  Medications: I have reviewed the patient's current medications. Anti-infectives     Start     Dose/Rate Route Frequency Ordered Stop   04/16/11 0600   vancomycin (VANCOCIN) 1,500 mg in sodium chloride 0.9 % 500 mL IVPB        1,500 mg 250 mL/hr over 120 Minutes Intravenous Every 12 hours 04/15/11 1837     04/13/11 1400   fluconazole (DIFLUCAN) tablet 150 mg        150 mg Oral  Once 04/13/11 1223 04/13/11 1839   04/13/11 1400    imipenem-cilastatin (PRIMAXIN) 500 mg in sodium chloride 0.9 % 100 mL IVPB  Status:  Discontinued        500 mg 200 mL/hr over 30 Minutes Intravenous Every 6 hours 04/13/11 1230 04/16/11 1236   04/12/11 0600   vancomycin (VANCOCIN) 1,250 mg in sodium chloride 0.9 % 250 mL IVPB  Status:  Discontinued        1,250 mg 166.7 mL/hr over 90 Minutes Intravenous Every 12 hours 04/11/11 1631 04/15/11 1837   04/11/11 2200   ceFEPIme (MAXIPIME) 1 g in dextrose 5 % 50 mL IVPB  Status:  Discontinued        1 g 100 mL/hr over 30 Minutes Intravenous Every 12 hours 04/11/11 1803 04/13/11 1223   04/11/11 1800   Ampicillin-Sulbactam (UNASYN) 3 g in sodium chloride 0.9 % 100 mL IVPB  Status:  Discontinued        3 g 100 mL/hr over 60 Minutes Intravenous Every 6 hours 04/11/11 1755 04/11/11 1803   04/11/11 1720   vancomycin (VANCOCIN) 2,500 mg in sodium chloride 0.9 % 500 mL IVPB        2,500 mg 250 mL/hr over 120 Minutes Intravenous  Once 04/11/11 1631 04/11/11 2010   04/11/11 1700   piperacillin-tazobactam (ZOSYN) IVPB 3.375 g  Status:  Discontinued        3.375 g 12.5 mL/hr over 240 Minutes Intravenous 3 times per day 04/11/11 1631 04/11/11 1755   04/11/11  1000   clindamycin (CLEOCIN) IVPB 600 mg        600 mg 100 mL/hr over 30 Minutes Intravenous  Once 04/11/11 0948 04/11/11 1042          Social History:  reports that she has been smoking.  She does not have any smokeless tobacco history on file. She reports that she does not drink alcohol or use illicit drugs.  Family History  Problem Relation Age of Onset  . Heart failure Mother   . Diabetes Mother   . Kidney disease Mother   . Kidney disease Father     As in HPI and primary teams notes otherwise 12 point review of systems is negative  Blood pressure 110/82, pulse 79, temperature 98.2 F (36.8 C), temperature source Oral, resp. rate 20, height 5\' 10"  (1.778 m), weight 282 lb 11.2 oz (128.232 kg), last menstrual period 03/24/2011, SpO2  94.00%. General: Alert and awake, oriented x3,  dysphoric HEENT: anicteric sclera, pupils reactive to light and accommodation, EOMI, lip is swollen L>>R, she has induration on chin with head on tip of this. I could not express pus from this area. It is tender CVS regular rate, normal r,  no murmur rubs or gallops Chest: clear to auscultation bilaterally, no wheezing, rales or rhonchi Abdomen: soft nontender, nondistended, normal bowel sounds, Extremities: no  clubbing or edema noted bilaterally Skin: see above Neuro: nonfocal intact strength and sensation   Results for orders placed during the hospital encounter of 04/11/11 (from the past 48 hour(s))  GLUCOSE, CAPILLARY     Status: Abnormal   Collection Time   04/14/11  3:59 PM      Component Value Range Comment   Glucose-Capillary 296 (*) 70 - 99 (mg/dL)   GLUCOSE, CAPILLARY     Status: Abnormal   Collection Time   04/14/11  8:19 PM      Component Value Range Comment   Glucose-Capillary 143 (*) 70 - 99 (mg/dL)    Comment 1 Documented in Chart      Comment 2 Notify RN     GLUCOSE, CAPILLARY     Status: Abnormal   Collection Time   04/14/11 11:53 PM      Component Value Range Comment   Glucose-Capillary 234 (*) 70 - 99 (mg/dL)    Comment 1 Notify RN     GLUCOSE, CAPILLARY     Status: Abnormal   Collection Time   04/15/11  5:14 AM      Component Value Range Comment   Glucose-Capillary 221 (*) 70 - 99 (mg/dL)   GLUCOSE, CAPILLARY     Status: Abnormal   Collection Time   04/15/11  8:12 AM      Component Value Range Comment   Glucose-Capillary 233 (*) 70 - 99 (mg/dL)    Comment 1 Notify RN     GLUCOSE, CAPILLARY     Status: Abnormal   Collection Time   04/15/11 12:12 PM      Component Value Range Comment   Glucose-Capillary 182 (*) 70 - 99 (mg/dL)   GLUCOSE, CAPILLARY     Status: Abnormal   Collection Time   04/15/11  4:28 PM      Component Value Range Comment   Glucose-Capillary 230 (*) 70 - 99 (mg/dL)    Comment 1  Notify RN     VANCOMYCIN, TROUGH     Status: Abnormal   Collection Time   04/15/11  5:20 PM      Component Value Range  Comment   Vancomycin Tr 9.1 (*) 10.0 - 20.0 (ug/mL)   GLUCOSE, CAPILLARY     Status: Abnormal   Collection Time   04/15/11  8:12 PM      Component Value Range Comment   Glucose-Capillary 112 (*) 70 - 99 (mg/dL)    Comment 1 Documented in Chart      Comment 2 Notify RN     GLUCOSE, CAPILLARY     Status: Abnormal   Collection Time   04/15/11 11:57 PM      Component Value Range Comment   Glucose-Capillary 146 (*) 70 - 99 (mg/dL)    Comment 1 Notify RN     GLUCOSE, CAPILLARY     Status: Abnormal   Collection Time   04/16/11  4:23 AM      Component Value Range Comment   Glucose-Capillary 140 (*) 70 - 99 (mg/dL)    Comment 1 Notify RN     CBC     Status: Abnormal   Collection Time   04/16/11  6:00 AM      Component Value Range Comment   WBC QUANTITY NOT SUFFICIENT, UNABLE TO PERFORM TEST  4.0 - 10.5 (K/uL)    RBC 2.95 (*) 3.87 - 5.11 (MIL/uL)    Hemoglobin 8.7 (*) 12.0 - 15.0 (g/dL)    HCT 23.8 (*) 36.0 - 46.0 (%)    MCV 80.7  78.0 - 100.0 (fL)    MCH 29.5  26.0 - 34.0 (pg)    MCHC 36.6 (*) 30.0 - 36.0 (g/dL)    RDW 12.4  11.5 - 15.5 (%)    Platelets 206  150 - 400 (K/uL)   GLUCOSE, CAPILLARY     Status: Abnormal   Collection Time   04/16/11  8:26 AM      Component Value Range Comment   Glucose-Capillary 122 (*) 70 - 99 (mg/dL)    Comment 1 Documented in Chart      Comment 2 Notify RN     GLUCOSE, CAPILLARY     Status: Abnormal   Collection Time   04/16/11 12:06 PM      Component Value Range Comment   Glucose-Capillary 185 (*) 70 - 99 (mg/dL)    Comment 1 Documented in Chart      Comment 2 Notify RN         Component Value Date/Time   SDES BLOOD RIGHT HAND 04/11/2011 1058   SPECREQUEST BOTTLES DRAWN AEROBIC AND ANAEROBIC 10CC 04/11/2011 1058   CULT        BLOOD CULTURE RECEIVED NO GROWTH TO DATE CULTURE WILL BE HELD FOR 5 DAYS BEFORE ISSUING A FINAL  NEGATIVE REPORT 04/11/2011 1058   REPTSTATUS PENDING 04/11/2011 1058   No results found.   Recent Results (from the past 720 hour(s))  CULTURE, BLOOD (ROUTINE X 2)     Status: Normal (Preliminary result)   Collection Time   04/11/11 10:40 AM      Component Value Range Status Comment   Specimen Description BLOOD RIGHT ARM   Final    Special Requests BOTTLES DRAWN AEROBIC ONLY 4CC   Final    Setup Time [id number]   Final    Culture     Final    Value:        BLOOD CULTURE RECEIVED NO GROWTH TO DATE CULTURE WILL BE HELD FOR 5 DAYS BEFORE ISSUING A FINAL NEGATIVE REPORT   Report Status PENDING   Incomplete   CULTURE, BLOOD (ROUTINE X 2)  Status: Normal (Preliminary result)   Collection Time   04/11/11 10:58 AM      Component Value Range Status Comment   Specimen Description BLOOD RIGHT HAND   Final    Special Requests BOTTLES DRAWN AEROBIC AND ANAEROBIC 10CC   Final    Setup Time [id number]   Final    Culture     Final    Value:        BLOOD CULTURE RECEIVED NO GROWTH TO DATE CULTURE WILL BE HELD FOR 5 DAYS BEFORE ISSUING A FINAL NEGATIVE REPORT   Report Status PENDING   Incomplete   MRSA PCR SCREENING     Status: Normal   Collection Time   04/11/11  4:51 PM      Component Value Range Status Comment   MRSA by PCR NEGATIVE  NEGATIVE  Final    Impression/Recommendation 35 year old with DM, soft tissue infection of the chin, with prior hx of recurrent MRSA soft tissue infections  Statistically MRSA, MSSA, strep would be most likely culprits in the face. Certainly pseudomonas is in differential in diabetic but would be more common with diabetic foot ulcer or malignant otitis as site of skin infection --I would narrow her to vancomycin alone to cover MRSA< MSSA and strep species --I would have her apply warm compresses at least 4 times a day to facilitate drainage --if she does well on this regimen would chang her to high dose bactrim TWO DS TABLETS TWICE DAILY and continue for  another 14 days with followup as outpt with PCP   Infection prevention: --given that we are treating for presumptive MRSA in pt with very high liklihood of MRSA infection, institute contact precautions   Thank you so much for this interesting consult,   Alcide Evener 04/16/2011, 2:03 PM

## 2011-04-17 LAB — CULTURE, BLOOD (ROUTINE X 2)
Culture  Setup Time: 201211191410
Culture: NO GROWTH

## 2011-04-17 LAB — CBC
Hemoglobin: 10.8 g/dL — ABNORMAL LOW (ref 12.0–15.0)
MCH: 26.6 pg (ref 26.0–34.0)
MCV: 80 fL (ref 78.0–100.0)
Platelets: 297 10*3/uL (ref 150–400)
RBC: 4.06 MIL/uL (ref 3.87–5.11)
WBC: 4.2 10*3/uL (ref 4.0–10.5)

## 2011-04-17 LAB — GLUCOSE, CAPILLARY
Glucose-Capillary: 112 mg/dL — ABNORMAL HIGH (ref 70–99)
Glucose-Capillary: 167 mg/dL — ABNORMAL HIGH (ref 70–99)

## 2011-04-17 NOTE — Progress Notes (Signed)
Subjective: Still concerne re "fever blister" as well as chin infection  Objective: Weight change:   Intake/Output Summary (Last 24 hours) at 04/17/11 1649 Last data filed at 04/17/11 0700  Gross per 24 hour  Intake    720 ml  Output      2 ml  Net    718 ml   Blood pressure 138/84, pulse 79, temperature 98.2 F (36.8 C), temperature source Oral, resp. rate 18, height 5\' 10"  (1.778 m), weight 282 lb 11.2 oz (128.232 kg), last menstrual period 03/24/2011, SpO2 99.00%. Temp:  [98 F (36.7 C)-98.2 F (36.8 C)] 98.2 F (36.8 C) (11/25 1515) Pulse Rate:  [72-79] 79  (11/25 1515) Resp:  [18-20] 18  (11/25 1515) BP: (128-138)/(84-89) 138/84 mmHg (11/25 1515) SpO2:  [95 %-99 %] 99 % (11/25 1515)  Physical Exam: General: Alert and awake, oriented x3, not in any acute distress. HEENT seek skin, lip swollen CVS regular rate, normal r,  no murmur rubs or gallops Chest: clear to auscultation bilaterally, no wheezing, rales or rhonchi Abdomen: soft nontender, nondistended, normal bowel sounds, Extremities: no  clubbing or edema noted bilaterally Skin: small blister right chin, left side of chin with indurated skin, no pus expressible from this, lip is swollen Neuro: nonfocal  Lab Results:  Basename 04/17/11 0928 04/16/11 0600  WBC 4.2 QUANTITY NOT SUFFICIENT, UNABLE TO PERFORM TEST  HGB 10.8* 8.7*  HCT 32.5* 23.8*  PLT 297 206   BMET No results found for this basename: NA:2,K:2,CL:2,CO2:2,GLUCOSE:2,BUN:2,CREATININE:2,CALCIUM:2 in the last 72 hours  Micro Results: Recent Results (from the past 240 hour(s))  CULTURE, BLOOD (ROUTINE X 2)     Status: Normal   Collection Time   04/11/11 10:40 AM      Component Value Range Status Comment   Specimen Description BLOOD RIGHT ARM   Final    Special Requests BOTTLES DRAWN AEROBIC ONLY 4CC   Final    Setup Time [id number]   Final    Culture NO GROWTH 5 DAYS   Final    Report Status 04/17/2011 FINAL   Final   CULTURE, BLOOD (ROUTINE X  2)     Status: Normal   Collection Time   04/11/11 10:58 AM      Component Value Range Status Comment   Specimen Description BLOOD RIGHT HAND   Final    Special Requests BOTTLES DRAWN AEROBIC AND ANAEROBIC 10CC   Final    Setup Time [id number]   Final    Culture NO GROWTH 5 DAYS   Final    Report Status 04/17/2011 FINAL   Final   MRSA PCR SCREENING     Status: Normal   Collection Time   04/11/11  4:51 PM      Component Value Range Status Comment   MRSA by PCR NEGATIVE  NEGATIVE  Final     Studies/Results: X-ray Chest Pa And Lateral   04/11/2011  *RADIOLOGY REPORT*  Clinical Data: Chest pain and shortness of breath.  CHEST - 2 VIEW  Comparison: 03/21/2010  Findings: Heart size and vascularity are normal and the lungs are clear except for a tiny area of scarring in the right midzone.  No significant osseous abnormality.  IMPRESSION: Essentially normal chest.  Original Report Authenticated By: Larey Seat, M.D.   Ct Soft Tissue Neck W Contrast  04/11/2011  *RADIOLOGY REPORT*  Clinical Data: Facial cellulitis.  CT NECK WITH CONTRAST  Technique:  Multidetector CT imaging of the neck was performed with intravenous contrast.  Contrast:  191mL OMNIPAQUE IOHEXOL 300 MG/ML IV SOLN  Comparison: None.  Findings: There is soft tissue stranding anterior to the body of the mandible extending to the right and left of midline.  There is some reactive submandibular adenopathy.  However, there is no visible deep space infection in the floor of the mouth or elsewhere in the neck.  Submandibular glands and parotid glands are normal. Slight adenopathy in both sides of the neck, greater on the left on the right.  No prevertebral soft tissue swelling.  Thyroid gland is normal.  Lung apices are clear.  Osseous structures of the skull and mandible demonstrate no significant abnormalities.  IMPRESSION: Cellulitis of the soft tissues anterior to the mandible.    No deep space infection.  Original Report  Authenticated By: Larey Seat, M.D.    Antibiotics:  Anti-infectives     Start     Dose/Rate Route Frequency Ordered Stop   04/16/11 0600   vancomycin (VANCOCIN) 1,500 mg in sodium chloride 0.9 % 500 mL IVPB        1,500 mg 250 mL/hr over 120 Minutes Intravenous Every 12 hours 04/15/11 1837     04/13/11 1400   fluconazole (DIFLUCAN) tablet 150 mg        150 mg Oral  Once 04/13/11 1223 04/13/11 1839   04/13/11 1400   imipenem-cilastatin (PRIMAXIN) 500 mg in sodium chloride 0.9 % 100 mL IVPB  Status:  Discontinued        500 mg 200 mL/hr over 30 Minutes Intravenous Every 6 hours 04/13/11 1230 04/16/11 1236   04/12/11 0600   vancomycin (VANCOCIN) 1,250 mg in sodium chloride 0.9 % 250 mL IVPB  Status:  Discontinued        1,250 mg 166.7 mL/hr over 90 Minutes Intravenous Every 12 hours 04/11/11 1631 04/15/11 1837   04/11/11 2200   ceFEPIme (MAXIPIME) 1 g in dextrose 5 % 50 mL IVPB  Status:  Discontinued        1 g 100 mL/hr over 30 Minutes Intravenous Every 12 hours 04/11/11 1803 04/13/11 1223   04/11/11 1800   Ampicillin-Sulbactam (UNASYN) 3 g in sodium chloride 0.9 % 100 mL IVPB  Status:  Discontinued        3 g 100 mL/hr over 60 Minutes Intravenous Every 6 hours 04/11/11 1755 04/11/11 1803   04/11/11 1720   vancomycin (VANCOCIN) 2,500 mg in sodium chloride 0.9 % 500 mL IVPB        2,500 mg 250 mL/hr over 120 Minutes Intravenous  Once 04/11/11 1631 04/11/11 2010   04/11/11 1700   piperacillin-tazobactam (ZOSYN) IVPB 3.375 g  Status:  Discontinued        3.375 g 12.5 mL/hr over 240 Minutes Intravenous 3 times per day 04/11/11 1631 04/11/11 1755   04/11/11 1000   clindamycin (CLEOCIN) IVPB 600 mg        600 mg 100 mL/hr over 30 Minutes Intravenous  Once 04/11/11 0948 04/11/11 1042          Medications: Scheduled Meds:   . enoxaparin  40 mg Subcutaneous Q24H  . insulin aspart  0-15 Units Subcutaneous Q4H  . insulin aspart protamine-insulin aspart  15 Units  Subcutaneous BID WC  . lisinopril  5 mg Oral Daily  . living well with diabetes book   Does not apply Once  . nicotine  14 mg Transdermal Daily  . pantoprazole  40 mg Oral Q1200  . sertraline  50 mg Oral Daily  . simvastatin  20 mg Oral q1800  . vancomycin  1,500 mg Intravenous Q12H   Continuous Infusions:  PRN Meds:.morphine, ondansetron (ZOFRAN) IV, ondansetron, senna-docusate, zolpidem  Assessment/Plan: [name] [name] is a 35 y.o. female with hx of recurrent MRSA infections  Soft tissue infection of the chin   --I would continue vancomycin IV while in house change to high dose PO bactrim DS two bid when she goes home --still wonder if this will not need I and D --continue the compresses   Dr Johnnye Sima back in the am  LOS: 6 days   Alcide Evener 04/17/2011, 4:49 PM

## 2011-04-17 NOTE — Progress Notes (Signed)
Subjective: Patient reports slight decrease in her lower chin swelling.  No significant pain.  Tolerating po well.  Objective: Vital signs in last 24 hours: Temp:  [98 F (36.7 C)-98.2 F (36.8 C)] 98.2 F (36.8 C) (11/25 1515) Pulse Rate:  [72-79] 79  (11/25 1515) Resp:  [18-20] 18  (11/25 1515) BP: (128-138)/(84-89) 138/84 mmHg (11/25 1515) SpO2:  [95 %-99 %] 99 % (11/25 1515)  GEN: No acute distress. A&Ox3.  HEENT: Her pupils are equal, round, reactive to light. Extraocular motion is intact. Examination of the ears shows normal auricles and external auditory canals bilaterally. Nasal examination shows normal mucosa, septum, turbinates. Facial examination shows swelling and edema of the left lower lip and lower chin. Palpable induration present involving lower lip, chin, and the submental area. A small wound is noted at the left lower chin. No palpable fluctuance is noted. Oral cavity examination shows no mucosal abnormality. No significant trismus is noted. Palpation of the neck reveals several small lymphadenopathy. The trachea is midline. The thyroid is not significantly enlarged. Cranial nerves 2-12 are all grossly in tact.  NEURO: CN II-XII grossly intact. No focal deficit.   Basename 04/17/11 0928 04/16/11 0600  WBC 4.2 QUANTITY NOT SUFFICIENT, UNABLE TO PERFORM TEST  HGB 10.8* 8.7*  HCT 32.5* 23.8*  PLT 297 206    Medications:  I have reviewed the patient's current medications. Scheduled:   . enoxaparin  40 mg Subcutaneous Q24H  . insulin aspart  0-15 Units Subcutaneous Q4H  . insulin aspart protamine-insulin aspart  15 Units Subcutaneous BID WC  . lisinopril  5 mg Oral Daily  . living well with diabetes book   Does not apply Once  . nicotine  14 mg Transdermal Daily  . pantoprazole  40 mg Oral Q1200  . sertraline  50 mg Oral Daily  . simvastatin  20 mg Oral q1800  . vancomycin  1,500 mg Intravenous Q12H   Assessment/Plan: Facial cellulitis. Slightly improved. No  drainable abscess noted on physical exam. Continue with IV abx as per ID.     LOS: 6 days   [name] [name],SUI W 04/17/2011, 4:32 PM

## 2011-04-17 NOTE — Progress Notes (Signed)
Subjective:  Patient noted that she feels that the swelling in her lips is worsening but otherwise denies any changes .   Objective: Vital signs in last 24 hours: Filed Vitals:   04/16/11 1500 04/16/11 2155 04/17/11 0545 04/17/11 1515  BP: 127/84 133/89 128/84 138/84  Pulse: 79 78 72 79  Temp: 97.7 F (36.5 C) 98 F (36.7 C) 98 F (36.7 C) 98.2 F (36.8 C)  TempSrc: Oral Axillary Oral Oral  Resp: 20 20 18 18   Height:      Weight:      SpO2: 90% 96% 95% 99%   Weight change:   Intake/Output Summary (Last 24 hours) at 04/17/11 2000 Last data filed at 04/17/11 0700  Gross per 24 hour  Intake    720 ml  Output      2 ml  Net    718 ml   Physical Exam:  General: alert, well-developed, and cooperative to examination.  Face: Continued swelling on the left side of her mandible extending from the submental to submandibular areas with some crusting and evidence of current drainage. Swelling of lip and chin improved since yesterday. Palpation lump, likely lymph node, in R jawline is smaller today as well. Multiple small blistering lesions on R lip which was noted yesterday are not draining at this point.  Lungs: normal respiratory effort, no accessory muscle use, normal breath sounds, no crackles, and no wheezes. Heart: normal rate, regular rhythm. No m/r/g today Abdomen: obese, soft , non-tender, BS present.   Lab Results: Basic Metabolic Panel:  Lab [id number] 04/12/11 0550  NA 135 134*  K 4.4 3.8  CL 103 103  CO2 26 23  GLUCOSE 152* 194*  BUN 7 9  CREATININE 0.54 0.53  CALCIUM 8.9 8.5  MG -- --  PHOS -- --   CBC:  Lab 04/17/11 0928 04/16/11 0600 04/12/11 0550 04/11/11 0851  WBC 4.2 QUANTITY NOT SUFFICIENT, UNABLE TO PERFORM TEST -- --  NEUTROABS -- -- 5.8 6.3  HGB 10.8* 8.7* -- --  HCT 32.5* 23.8* -- --  MCV 80.0 80.7 -- --  PLT 297 206 -- --   Cardiac Enzymes:  Lab 04/11/11 1638  CKTOTAL 128  CKMB 2.0  CKMBINDEX --  TROPONINI <0.30   CBG:  Lab  04/17/11 1647 04/17/11 1242 04/17/11 0820 04/17/11 0344 04/17/11 0013 04/16/11 1951  GLUCAP 260* 195* 157* 150* 167* 109*   Hemoglobin A1C:  Lab 04/11/11 1638  HGBA1C 12.8*   Fasting Lipid Panel:  Lab 04/12/11 0550  CHOL 174  HDL 42  LDLCALC 104*  TRIG 138  CHOLHDL 4.1  LDLDIRECT --   Micro Results: Recent Results (from the past 240 hour(s))  CULTURE, BLOOD (ROUTINE X 2)     Status: Normal   Collection Time   04/11/11 10:40 AM      Component Value Range Status Comment   Specimen Description BLOOD RIGHT ARM   Final    Special Requests BOTTLES DRAWN AEROBIC ONLY 4CC   Final    Setup Time [id number]   Final    Culture NO GROWTH 5 DAYS   Final    Report Status 04/17/2011 FINAL   Final   CULTURE, BLOOD (ROUTINE X 2)     Status: Normal   Collection Time   04/11/11 10:58 AM      Component Value Range Status Comment   Specimen Description BLOOD RIGHT HAND   Final    Special Requests BOTTLES DRAWN AEROBIC AND ANAEROBIC 10CC  Final    Setup Time [id number]   Final    Culture NO GROWTH 5 DAYS   Final    Report Status 04/17/2011 FINAL   Final   MRSA PCR SCREENING     Status: Normal   Collection Time   04/11/11  4:51 PM      Component Value Range Status Comment   MRSA by PCR NEGATIVE  NEGATIVE  Final    Studies/Results: No results found. Medications: I have reviewed the patient's current medications. Scheduled Meds:   . enoxaparin  40 mg Subcutaneous Q24H  . insulin aspart  0-15 Units Subcutaneous Q4H  . insulin aspart protamine-insulin aspart  15 Units Subcutaneous BID WC  . lisinopril  5 mg Oral Daily  . living well with diabetes book   Does not apply Once  . nicotine  14 mg Transdermal Daily  . pantoprazole  40 mg Oral Q1200  . sertraline  50 mg Oral Daily  . simvastatin  20 mg Oral q1800  . vancomycin  1,500 mg Intravenous Q12H   Continuous Infusions:  PRN Meds:.morphine, ondansetron (ZOFRAN) IV, ondansetron, senna-docusate,  zolpidem Assessment/Plan:  Skin/Soft Tissue Infection of lower face/neck: Stable.  Afebrile. No Leucocytosis. Primaxin d/c per Dr Tommy Medal recommendation yesterday.  - Will continue IV abx Vanc and will need high dose PO bactrim when d/c home per ID.  - ENT recommends to continue IV antibiotic and compression.   - Need for Iand D ?  -  Greatly appreciate input from ENT consultant Dr. Benjamine Mola.   Chest Tightness: No chest tightness last two days. Seems to have improved since starting PPI. EKG only demonstrated mild left axis deviation without evidence of any ischemia. Has history of endocarditis and murmur appreciated on exam two days ago that has not been heard since. TTE only showed mild LVH, no sign of valvular disease. Normal EF.  - Cont protonix as empiric therapy for GERD-like symptoms possibly causing her chest pain.   DM: CBG's improved with 70/30 insulin and sliding scale.  Seen by inpt diabetic counselor and diabetic counselor from [facility]. This is very important issue on follow-up.  -Cont lisinopril for renal protection  -Cont tight glucose control which should improve her healing   HTN: Blood pressures remain well controlled.  Will continue low dose lisinopril and continue to monitor her BP's.   Depression: Patient gives a history of [facility] and [facility] in her teenage yrs, but denies seeking mental health care since. She became tearful describing her depression worsening in recent months due to tough situation at home (caring for her mom, losing her job, seeing her friends at her age have things she doesn't). She has depressed mood, anhedonia, sleep troubles, feelings of guilt. Repeatedly denies suicidal ideation. Will start SSRI during hospitalization and get her mental health care with help of social worker in our clinic on her follow-up with [facility].  -Will continue Zoloft, too soon to evaluate its efficacy   DVT Proph: Lovenox    LOS: 6 days    [name] [name] 04/17/2011, 8:00 PM

## 2011-04-18 LAB — CREATININE, SERUM
Creatinine, Ser: 0.65 mg/dL (ref 0.50–1.10)
GFR calc Af Amer: >90 mL/min (ref >90)
GFR calc non Af Amer: >90 mL/min (ref >90)

## 2011-04-18 LAB — GLUCOSE, CAPILLARY
Glucose-Capillary: 120 mg/dL — ABNORMAL HIGH (ref 70–99)
Glucose-Capillary: 115 mg/dL — ABNORMAL HIGH (ref 70–99)
Glucose-Capillary: 128 mg/dL — ABNORMAL HIGH (ref 70–99)

## 2011-04-18 MED ORDER — PANTOPRAZOLE SODIUM 40 MG PO TBEC
40.0000 mg | DELAYED_RELEASE_TABLET | Freq: Two times a day (BID) | ORAL | Status: DC
  Administered 2011-04-18: 40 mg via ORAL
  Filled 2011-04-18 (×2): qty 1

## 2011-04-18 MED ORDER — SULFAMETHOXAZOLE-TMP DS 800-160 MG PO TABS
2.0000 | ORAL_TABLET | Freq: Two times a day (BID) | ORAL | Status: DC
  Administered 2011-04-18 – 2011-04-19 (×3): 2 via ORAL
  Filled 2011-04-18 (×5): qty 2

## 2011-04-18 MED ORDER — ACETAMINOPHEN 325 MG PO TABS
650.0000 mg | ORAL_TABLET | Freq: Four times a day (QID) | ORAL | Status: DC | PRN
  Administered 2011-04-18 – 2011-04-19 (×2): 650 mg via ORAL
  Filled 2011-04-18 (×2): qty 2

## 2011-04-18 MED ORDER — IBUPROFEN 400 MG PO TABS: 400.0000 mg | ORAL_TABLET | Freq: Four times a day (QID) | ORAL | Status: DC | PRN

## 2011-04-18 MED ORDER — FLUOXETINE HCL 20 MG PO CAPS
20.0000 mg | ORAL_CAPSULE | Freq: Every day | ORAL | Status: DC
  Administered 2011-04-18 – 2011-04-19 (×2): 20 mg via ORAL
  Filled 2011-04-18 (×2): qty 1

## 2011-04-18 NOTE — Progress Notes (Signed)
Patient ID: BREELLE SCHORN, female   DOB: Apr 19, 1976, 35 y.o.   MRN: [id number] Internal Medicine Teaching Service Attending Note Date: 04/18/2011  Patient name: [name] [name]  Medical record number: [id number]  Date of birth: Dec 23, 1975    This patient has been seen and discussed with the house staff. Please see their note for complete details. I concur with their findings with the following additions/corrections: Has induration of her soft tissue on her L chin. There is no d/c from the wound here. Her tenderness is significantly improved from previous. Will d/i team re: changing anbx to po and d/c plan.  Bobby Rumpf 04/18/2011, 11:07 AM

## 2011-04-18 NOTE — Progress Notes (Signed)
Subjective: Pt c/o persistent swelling of her chin and lower lip.  Pain controlled.  Objective: Vital signs in last 24 hours: Temp:  [97.8 F (36.6 C)-98.3 F (36.8 C)] 97.8 F (36.6 C) (11/26 0500) Pulse Rate:  [68-79] 68  (11/26 0500) Resp:  [18-20] 20  (11/26 0500) BP: (120-138)/(80-84) 130/80 mmHg (11/26 0952) SpO2:  [96 %-99 %] 96 % (11/26 0500)  Physical Exam:  GEN: No acute distress. A&Ox3.  HEENT: Her pupils are equal, round, reactive to light. Extraocular motion is intact. Examination of the ears shows normal auricles and external auditory canals bilaterally. Nasal examination shows normal mucosa, septum, turbinates. Facial examination shows mild swelling and edema of the left lower lip and lower chin. Palpable induration present involving lower lip and chin. A small wound is noted at the left lower chin. No palpable fluctuance is noted.  No significant trismus is noted. Palpation of the neck reveals several small lymphadenopathy. The trachea is midline. The thyroid is not significantly enlarged. Cranial nerves 2-12 are all grossly in tact.  NEURO: CN II-XII grossly intact. No focal deficit.    Basename 04/17/11 0928 04/16/11 0600  WBC 4.2 QUANTITY NOT SUFFICIENT, UNABLE TO PERFORM TEST  HGB 10.8* 8.7*  HCT 32.5* 23.8*  PLT 297 206    Basename 04/18/11 0922  NA --  K --  CL --  CO2 --  GLUCOSE --  BUN --  CREATININE 0.65  CALCIUM --    Medications:  I have reviewed the patient's current medications. Scheduled:   . enoxaparin  40 mg Subcutaneous Q24H  . insulin aspart  0-15 Units Subcutaneous Q4H  . insulin aspart protamine-insulin aspart  15 Units Subcutaneous BID WC  . lisinopril  5 mg Oral Daily  . living well with diabetes book   Does not apply Once  . nicotine  14 mg Transdermal Daily  . pantoprazole  40 mg Oral Q1200  . sertraline  50 mg Oral Daily  . simvastatin  20 mg Oral q1800  . vancomycin  1,500 mg Intravenous Q12H     Assessment/Plan: Despite pt's subjective complaint, her chin and lower lip induration has decreased.  No palpable fluctuance.  No drainable abscess at this time.  Agree with continuing abx and warm compresses.   LOS: 7 days   Aahil Fredin,SUI W 04/18/2011, 10:53 AM

## 2011-04-18 NOTE — Progress Notes (Addendum)
Subjective: [name] [name] is causing very little pain.  Does not note any drainage.  Continues to endorse chest pain, last had last night after eating.  Similar tightness quality low centrally as on admission.  Also has similar aching frontal headache to admission- no photophobia, nausea.  Has had similar headches before.    Objective: Vital signs in last 24 hours: Filed Vitals:   04/17/11 2100 04/18/11 0300 04/18/11 0500 04/18/11 0952  BP: 132/82 120/84 120/84 130/80  Pulse: 78 68 68   Temp: 98.3 F (36.8 C) 97.8 F (36.6 C) 97.8 F (36.6 C)   TempSrc: Oral Oral Oral   Resp: 20 20 20    Height:      Weight:      SpO2: 99% 96% 96%     Physical Exam: General: alert, well-developed, and cooperative to examination.  Face: Improved swelling on the left side of her chin.  Hard/indurated 2x2cm area with no palpable fluctuance.   Lungs: normal respiratory effort, no accessory muscle use, normal breath sounds, no crackles, and no wheezes. Heart: normal rate, regular rhythm. No m/r/g today.      Lab Results: Basic Metabolic Panel:  Lab [id number] 04/14/11 0845 04/12/11 0550  NA -- 135 134*  K -- 4.4 3.8  CL -- 103 103  CO2 -- 26 23  GLUCOSE -- 152* 194*  BUN -- 7 9  CREATININE 0.65 0.54 --  CALCIUM -- 8.9 8.5  MG -- -- --  PHOS -- -- --    CBC:  Lab 04/17/11 0928 04/16/11 0600 04/12/11 0550  WBC 4.2 QUANTITY NOT SUFFICIENT, UNABLE TO PERFORM TEST --  NEUTROABS -- -- 5.8  HGB 10.8* 8.7* --  HCT 32.5* 23.8* --  MCV 80.0 80.7 --  PLT 297 206 --   Cardiac Enzymes:  Lab 04/11/11 1638  CKTOTAL 128  CKMB 2.0  CKMBINDEX --  TROPONINI <0.30   CBG:  Lab 04/18/11 0824 04/18/11 0432 04/18/11 0012 04/17/11 2046 04/17/11 1647 04/17/11 1242  GLUCAP 120* 131* 272* 112* 260* 195*   Hemoglobin A1C:  Lab 04/11/11 1638  HGBA1C 12.8*   Fasting Lipid Panel:  Lab 04/12/11 0550  CHOL 174  HDL 42  LDLCALC 104*  TRIG 138  CHOLHDL 4.1  LDLDIRECT --   Urine Drug  Screen: Drugs of Abuse     Component Value Date/Time   LABOPIA NONE DETECTED 04/11/2011 1656   COCAINSCRNUR NONE DETECTED 04/11/2011 1656   LABBENZ NONE DETECTED 04/11/2011 1656   AMPHETMU NONE DETECTED 04/11/2011 1656   THCU NONE DETECTED 04/11/2011 1656   LABBARB NONE DETECTED 04/11/2011 1656      Micro Results: Recent Results (from the past 240 hour(s))  CULTURE, BLOOD (ROUTINE X 2)     Status: Normal   Collection Time   04/11/11 10:40 AM      Component Value Range Status Comment   Specimen Description BLOOD RIGHT ARM   Final    Special Requests BOTTLES DRAWN AEROBIC ONLY 4CC   Final    Setup Time [id number]   Final    Culture NO GROWTH 5 DAYS   Final    Report Status 04/17/2011 FINAL   Final   CULTURE, BLOOD (ROUTINE X 2)     Status: Normal   Collection Time   04/11/11 10:58 AM      Component Value Range Status Comment   Specimen Description BLOOD RIGHT HAND   Final    Special Requests BOTTLES DRAWN AEROBIC AND ANAEROBIC 10CC   Final  Setup Time [id number]   Final    Culture NO GROWTH 5 DAYS   Final    Report Status 04/17/2011 FINAL   Final   MRSA PCR SCREENING     Status: Normal   Collection Time   04/11/11  4:51 PM      Component Value Range Status Comment   MRSA by PCR NEGATIVE  NEGATIVE  Final    Medications: I have reviewed the patient's current medications. Scheduled Meds:   . enoxaparin  40 mg Subcutaneous Q24H  . insulin aspart  0-15 Units Subcutaneous Q4H  . insulin aspart protamine-insulin aspart  15 Units Subcutaneous BID WC  . lisinopril  5 mg Oral Daily  . living well with diabetes book   Does not apply Once  . nicotine  14 mg Transdermal Daily  . pantoprazole  40 mg Oral BID AC  . sertraline  50 mg Oral Daily  . simvastatin  20 mg Oral q1800  . vancomycin  1,500 mg Intravenous Q12H  . DISCONTD: pantoprazole  40 mg Oral Q1200   Continuous Infusions:  PRN Meds:.acetaminophen, senna-docusate, zolpidem, DISCONTD: morphine, DISCONTD:  ondansetron (ZOFRAN) IV, DISCONTD: ondansetron   Assessment/Plan: Skin/Soft Tissue Infection of lower face/neck: Improving on just vancomycin.   - Will switch to PO bactrim - Greatly appreciate input from ENT consultant Dr. Benjamine Mola and ID Dr. Tommy Medal  Chest Tightness: One episode last night.  Has been getting frequent morphine (2mg  IV 3x per day). Seems to have improved since starting PPI? EKG only demonstrated mild left axis deviation without evidence of any ischemia. Has history of endocarditis and murmur appreciated on exam two days ago that has not been heard since. CEs negative x 1 set.  TTE only showed mild LVH, no sign of valvular disease. Normal EF.  - Increase protonix to BID -D/c morphine, start tylenol.  Will wait until tomorrow for discharge to evaluate this one more time  Headache: History of headaches, etiology of this is uncertain.  May be tension headache in setting of stress.  Has been taking a lot of morphine here, which is not the best medicine for this.  Last creatinine was normal.  Will start PRN tylenol 650mg  PO Q6H.  Will wait for discharge until the morning to re-eval this.    DM: CBG's improved with 70/30 insulin and sliding scale.  Seen by inpt diabetic counselor and diabetic counselor from [facility]. This is very important issue on follow-up.  -Cont lisinopril for renal protection  -Cont tight glucose control which should improve her healing -Will discharge on 70/30 regular/NPH for cost reasons, and will defer restarting metformin to her outpt PCP   HTN: Blood pressures remain well controlled.  Will continue low dose lisinopril and continue to monitor her BP's.   Depression: Zoloft started this hospitalization.  Will give patient information on [facility] [place] on discharge and recommend she discuss this further with PCP.  -Will switch to Prozac for cost reasons and discharge with this  DVT Proph: Lovenox    LOS: 7 days   [name],  BRAD 04/18/2011, 12:12 PM

## 2011-04-19 LAB — GLUCOSE, CAPILLARY
Glucose-Capillary: 131 mg/dL — ABNORMAL HIGH (ref 70–99)
Glucose-Capillary: 160 mg/dL — ABNORMAL HIGH (ref 70–99)

## 2011-04-19 MED ORDER — SULFAMETHOXAZOLE-TMP DS 800-160 MG PO TABS: 2.0000 | ORAL_TABLET | Freq: Two times a day (BID) | ORAL | Status: DC

## 2011-04-19 MED ORDER — OMEPRAZOLE 20 MG PO CPDR: 20.0000 mg | DELAYED_RELEASE_CAPSULE | Freq: Every day | ORAL | Status: DC

## 2011-04-19 MED ORDER — FLUOXETINE HCL 20 MG PO CAPS: 20.0000 mg | ORAL_CAPSULE | Freq: Every day | ORAL | Status: DC

## 2011-04-19 MED ORDER — LISINOPRIL 5 MG PO TABS: 5.0000 mg | ORAL_TABLET | Freq: Every day | ORAL | Status: DC

## 2011-04-19 MED ORDER — ACETAMINOPHEN 325 MG PO TABS: 650.0000 mg | ORAL_TABLET | Freq: Four times a day (QID) | ORAL | Status: AC | PRN

## 2011-04-19 MED ORDER — INSULIN NPH ISOPHANE & REGULAR (70-30) 100 UNIT/ML ~~LOC~~ SUSP: 20.0000 [IU] | Freq: Two times a day (BID) | SUBCUTANEOUS | Status: DC

## 2011-04-19 MED ORDER — SULFAMETHOXAZOLE-TMP DS 800-160 MG PO TABS: 2.0000 | ORAL_TABLET | Freq: Two times a day (BID) | ORAL | Status: AC

## 2011-04-19 MED ORDER — PRAVASTATIN SODIUM 40 MG PO TABS: 40.0000 mg | ORAL_TABLET | Freq: Every day | ORAL | Status: DC

## 2011-04-19 NOTE — Discharge Summary (Signed)
Internal [facility] Discharge Note  Name: [name] [name] MRN: [id number] DOB: Mar 11, 1976 35 y.o.  Date of Admission: 04/11/2011  8:28 AM Date of Discharge: 04/19/2011 Attending Physician: Bobby Rumpf, MD  Discharge Diagnosis: Active Problems:  Cellulitis: Chin/Facial  DIABETES MELLITUS, TYPE II HYPERTENSION Chest Pain Headache Mood Disorder NOS    Discharge Medications: Current Discharge Medication List    START taking these medications   Details  acetaminophen (TYLENOL) 325 MG tablet Take 2 tablets (650 mg total) by mouth every 6 (six) hours as needed. Qty: 30 tablet, Refills: 0    FLUoxetine (PROZAC) 20 MG capsule Take 1 capsule (20 mg total) by mouth daily. Qty: 30 capsule, Refills: 0    omeprazole (PRILOSEC) 20 MG capsule Take 1 capsule (20 mg total) by mouth daily. Qty: 30 capsule, Refills: 0    sulfamethoxazole-trimethoprim (BACTRIM DS) 800-160 MG per tablet Take 2 tablets by mouth every 12 (twelve) hours. Qty: 40 tablet, Refills: 0      CONTINUE these medications which have CHANGED   Details  insulin NPH-insulin regular (NOVOLIN 70/30) (70-30) 100 UNIT/ML injection Inject 20 Units into the skin 2 (two) times daily with a meal. Qty: 10 mL, Refills: 0    lisinopril (PRINIVIL,ZESTRIL) 5 MG tablet Take 1 tablet (5 mg total) by mouth daily. Qty: 30 tablet, Refills: 0    pravastatin (PRAVACHOL) 40 MG tablet Take 1 tablet (40 mg total) by mouth daily. Qty: 30 tablet, Refills: 0      STOP taking these medications     metFORMIN (GLUCOPHAGE) 500 MG tablet         Disposition and follow-up:   [name] was discharged from [facility] in Good condition.  She has appt to be seen in the [facility] outpatient clinic on 04/28/11 with Dr. Obie Dredge.  There, her diabetes care should be addressed as she has very poor control and had not used insulin or PO diabetes meds for over 6 months before this admission.   Her HTN should be addressed as well.  Of course, the progress of her healing chin cellulitis should also be addressed.  Finally, she gave a history of depression that should be further evaluated (see hosp course by prob for more details).   She was given contact info and address for [facility] as well and told she can walk in there to seek care if she desires.  Follow-up Appointments: Follow-up Information    Follow up on 04/28/2011. (At Outpatient Clinic in [place].  Appt at 3:45pm)       Please follow up. (As needed for depression counseling)    Contact information:   [facility] [address], [place] [address] [phone]         Discharge Orders    Future Appointments: Provider: Department: Dept Phone: Center:   04/28/2011 3:45 PM [facility] [phone] [facility]     Future Orders Please Complete By Expires   Diet - low sodium heart healthy      Increase activity slowly      Call MD for:  temperature >100.4      Call MD for:  severe uncontrolled pain         Consultations:  ENT, Infectious Disease  Procedures Performed:  X-ray Chest Pa And Lateral   04/11/2011  *RADIOLOGY REPORT*  Clinical Data: Chest pain and shortness of breath.  CHEST -  2 VIEW  Comparison: 03/21/2010  Findings: Heart size and vascularity are normal and the lungs are clear except for a tiny area of scarring in the right midzone.  No significant osseous abnormality.  IMPRESSION: Essentially normal chest.  Original Report Authenticated By: Larey Seat, M.D.   Ct Soft Tissue Neck W Contrast  04/11/2011  *RADIOLOGY REPORT*  Clinical Data: Facial cellulitis.  CT NECK WITH CONTRAST  Technique:  Multidetector CT imaging of the neck was performed with intravenous contrast.  Contrast: 166mL OMNIPAQUE IOHEXOL 300 MG/ML IV SOLN  Comparison: None.  Findings: There is soft tissue stranding  anterior to the body of the mandible extending to the right and left of midline.  There is some reactive submandibular adenopathy.  However, there is no visible deep space infection in the floor of the mouth or elsewhere in the neck.  Submandibular glands and parotid glands are normal. Slight adenopathy in both sides of the neck, greater on the left on the right.  No prevertebral soft tissue swelling.  Thyroid gland is normal.  Lung apices are clear.  Osseous structures of the skull and mandible demonstrate no significant abnormalities.  IMPRESSION: Cellulitis of the soft tissues anterior to the mandible.    No deep space infection.  Original Report Authenticated By: Larey Seat, M.D.    2D Echo: Study Conclusions  - Left ventricle: The cavity size was normal. Wall thickness was increased in a pattern of mild LVH. Systolic function was vigorous. The estimated ejection fraction was in the range of 65% to 70%. Wall motion was normal; there were no regional wall motion abnormalities. Left ventricular diastolic function parameters were normal. - Atrial septum: No defect or patent foramen ovale was identified. - Pulmonary arteries: PA peak pressure: 35mm Hg (S).    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 She states that 3 days prior to admission she woke up and noted some soreness and a small swollen area on her left lower chin. She denies any boil/pimple formation. She attempted to apply warm compresses to alleviate the pain and promote range but states this only worsened the swelling and increased her pain. She states these symptoms are similar to soft tissue infection she has experienced in the past. She notes the area  drained a small amount of clear fluid and one day prior to arrival; she denies any purulent or bloody drainage. She has not taken her temperature at home but notes [place] is one day prior to arrival; denies sweats. She denies nausea, vomiting, diarrhea, abdominal pain, bright red blood per, dark tarry stools, shortness of breath, and syncope. She denies difficulty breathing or difficulty swallowing. She denies any recent dental work, or problems with dental cavities/abscesses.  She admits to some sternal chest pain that began 2 days prior to arrival. She characterizes chest pain is intermittent and lasting 5-10 minutes in duration. She describes her pain as a tightness that is worse with coughing and unchanged with eating. She denies any other precipitating, aggravating, or alleviating factors. She notes her cough is productive of phlegm that is yellow to greenish in color. She denies any sick contacts.    Hospital Course by problem list:  Cellulitis: Chin/Facial: Patient presented with a 4 day history of chin welling/erythema/pain.  On admission,  this was felt to be infectious, and Vancomycin and Zosyn were started.  This was then narrowed to Cefepime.  Patient did not significantly improve for the first 2-3 days.  Chin swelling decreased but lip swelling increased.  Cefepime was then switched to Primaxin by attending Dr. Johnnye Sima as there was a low suspicion that drug reaction could be contributing.  Throughout the next several days, the left chin area slowly drained small amounts of purulent/bloody fluid.  Over the holiday weekend, in an effort to narrow her coverage, primaxin was stopped by ID consult Dr. Tommy Medal.  Her face continued to improve on physical exam, and her pain improved as well.  The day before discharge, abx were switched to PO bactrim.  On discharge, she had minimal lip swelling (L side of lip most prominent).  She also had notable 1.5x1.5cm area of hard induration on L chin, and a lump  (likely lymph node) was palpation over R jaw line.  She was discharged on 10 more days of Bactrim DS two tabs BID, which the day she sees her [facility] follow-up.       Type 2 Diabetes Mellitus:  Patient presented with very poorly controlled DM, HbA1c 12.8%.  CBGs trended down to low 100s by discharge with Novolog 70/30 15 units BID plus SSI (received a lot of sliding scale doses).  She was previously prescribed novolog 70/30 15 units BID, as well as metformin.  She has not been taking metformin bc she said it upset her stomach, and she had not used insulin in over 6 months.  We discharged her on Regular/NPH 70/30 20 units BID.  On follow-up please assess how consistently she has been using it and how her sugars have been doing.  She says she has testing supplies at home.  She was seen by inpatient diabetes educator as well as [facility].  She was give a syringe starter kit by them.    Hypertension: BP 143/104 on admission, but was in 120s-130s / 80s for most of hospitalization on just Lisinopril 5mg .  This should be continued on discharge.  If she has been taking this when she presents to follow-up, please check a BMET on follow-up since, although this was on her med list before hospitalization, this is effectively a new medicine for her.  Chest Pain: Patient complained of lower central chest pain (also under R breast) on admission and at times (roughly once per day) during the hospitalization.  She described it as a tightness.  She says she had had this for a similar frequency for about one year.  EKG did not show signs of ischemia on admission, and one set of cardiac enzymes was normal.  The chest pain seemed to come more frequently after meals.  She was started on PPI during hospitalization and discharged on omeprazole.  Her chest pain should be further evaluated on fu, at which point a referral to cardiology for further workup could be considered if it  persists.   Headache: Patient had occasional headache during hospitalization.  Frontal achey pain.  She says she has had these at baseline for a long time, with them happening at similar frequency to baseline during hospitalization.  Morphine given for facial pain helped a little.  She was discharged with tylenol PRN.  This should be addressed at follow-up if it is still bothering her.  Mood Disorder NOS: Patient reported that depression was a component of why she does not take care of her diabetes.  She cares for her mom at home and recently lost her job, and she feels bad that she does not have the things in life (career, family) that friends her age have.  She vehemently denies any suicidal ideation at any point in recent years including during the hospitalization. She has not sought mental health care since she was a teenager.    However, as a teenager she suffered from depression (diagnosis uncertain) and had a suicide attempt taking a lot of aspirin.  He was hospitalized at two mental health facilities for a total of 8 months (one being [facility] [place]).    She was started on Zoloft during this current admission, and this was switch to Prozac the day before discharge in order to make it more affordable.    I discussed [facility] services with her and gave her their contact info and address. I also discussed the signs and symptoms of delirium and told her to stop taking prozac and call the outpatient clinic if she experiences these.  She would very much benefit from outpatient psychiatric services to confirm a diagnosis for her and help manage treatment.  Medication Adherence: Patient was not taking any medicine on admission despite being prescribed several.  Discussed with her the importance of being compliant with her medicines.  She agreed that should would try much harder.  Case manager was involved and at time of this discharge summary was attempting to get the hospital to  provide Bactrim 10 day course and Regular 70/30 insulin one vial free of charge.  Also attempted to choose as many [facility] $4 list meds as possible for her discharge meds.  Please evaluate her adherence and med rec on follow-up.   Discharge Vitals:  BP 128/86  Pulse 79  Temp(Src) 98.7 F (37.1 C) (Oral)  Resp 20  Ht 5\' 10"  (1.778 m)  Wt 282 lb 11.2 oz (128.232 kg)  BMI 40.56 kg/m2  SpO2 99%  LMP 03/24/2011  Discharge Labs:   Results for orders placed during the hospital encounter of 04/11/11 (from the past 24 hour(s))  GLUCOSE, CAPILLARY     Status: Abnormal   Collection Time   04/18/11 12:34 PM      Component Value Range   Glucose-Capillary 115 (*) 70 - 99 (mg/dL)   Comment 1 Notify RN    GLUCOSE, CAPILLARY     Status: Abnormal   Collection Time   04/18/11  4:02 PM      Component Value Range   Glucose-Capillary 142 (*) 70 - 99 (mg/dL)   Comment 1 Notify RN    GLUCOSE, CAPILLARY     Status: Abnormal   Collection Time   04/18/11  8:07 PM      Component Value Range   Glucose-Capillary 128 (*) 70 - 99 (mg/dL)  GLUCOSE, CAPILLARY     Status: Abnormal   Collection Time   04/19/11 12:26 AM      Component Value Range   Glucose-Capillary 160 (*) 70 - 99 (mg/dL)   Comment 1 Notify RN     Comment 2 Documented in Chart    GLUCOSE, CAPILLARY     Status: Abnormal   Collection Time   04/19/11  4:18 AM      Component Value Range   Glucose-Capillary 131 (*) 70 - 99 (mg/dL)   Comment 1 Notify RN     Comment 2 Documented in Chart    GLUCOSE, CAPILLARY     Status: Abnormal   Collection Time  04/19/11  8:02 AM      Component Value Range   Glucose-Capillary 153 (*) 70 - 99 (mg/dL)   Comment 1 Notify RN     Comment 2 Documented in Chart      Signed: Providence Lanius 04/19/2011, 12:20 PM

## 2011-04-19 NOTE — Progress Notes (Signed)
Patient ID: SYLVESTA WINDING, female   DOB: Nov 05, 1975, 35 y.o.   MRN: [id number] Internal Medicine Teaching Service Attending Note Date: 04/19/2011  Patient name: [name] [name]  Medical record number: [id number]  Date of birth: [birth year]    This patient has been seen and discussed with the house staff. Please see their note for complete details. I concur with their findings with the following additions/corrections: Her chin infection is much less indurated, tender. Will send her home with po bactrim. She was prev on anti-depressant, will restart low dose SSRI.  Bobby Rumpf 04/19/2011, 10:37 AM

## 2011-04-19 NOTE — Progress Notes (Signed)
Called by nursing for fever to 100.66F as patient is about to leave hospital.  She has had some coughing for the past day.  Headache, which she has had at times throughout hospitalization.  Sore throat only from coughing, not otherwise, and some rhinorrhea.  No shortness of breath.  In no distress.  No change from this morning on exam of facial infection.  Pharyngeal exam shows prominent vessels but otherwise no erythema.    Informed patient that she may have a viral URI, but that she would not benefit from staying in the hospital for this as she is in no distress. She is also being discharged on Bactrim for facial cellulitis, which would cover the most likely possible bacterial infections (although i do not highly suspect bacterial phargitis or pneumonia based on today's exams).    Patient was given the option to stay in the hospital another night if she was too concerned to leave, but we explained that there was no medical necessity to do so.  She chose to leave the hospital today as planned.  I asked that she call the outpatient clinic if she has significant worsening of her URI symptoms or develops fever over 101 F.  She has a 10 day course of Bactrim and a vial on insulin that should last her 3-4 weeks that have been provided free by the hospital.  Her fu in [facility] with Dr. Obie Dredge is scheduled for 04/28/11.

## 2011-04-20 ENCOUNTER — Telehealth: Payer: Self-pay | Admitting: Dietician

## 2011-04-20 NOTE — Telephone Encounter (Signed)
Patient was not at home, but person answering asked me to leave message on answering machine for [name] [name] and she would be sure she got it.   Reminded [name] [name] of her hospital follow up appointment tment next week 12/6 with Dr. Obie Dredge, asked her to schedule and appointment with CDE at that time and to call us in the interim if she was not able to fill her prescriptions.

## 2011-04-22 ENCOUNTER — Telehealth: Payer: Self-pay | Admitting: Licensed Clinical Social Worker

## 2011-04-22 NOTE — Telephone Encounter (Signed)
Spoke w/ mother.  Brittiney not at home and is visiting sister.  I will try and call Sahithi on Monday for assessment and planning.

## 2011-04-26 ENCOUNTER — Encounter: Payer: Self-pay | Admitting: Licensed Clinical Social Worker

## 2011-04-26 NOTE — Telephone Encounter (Signed)
Gentleman picked up and said Erna would not be home until 2:00 PM.  Sending letter.

## 2011-04-28 ENCOUNTER — Encounter: Payer: 59 | Admitting: Internal Medicine

## 2011-05-05 ENCOUNTER — Ambulatory Visit (INDEPENDENT_AMBULATORY_CARE_PROVIDER_SITE_OTHER): Payer: Self-pay | Admitting: Internal Medicine

## 2011-05-05 ENCOUNTER — Encounter: Payer: Self-pay | Admitting: Internal Medicine

## 2011-05-05 VITALS — BP 164/104 | HR 94 | Temp 97.0°F | Ht 68.0 in | Wt 317.8 lb

## 2011-05-05 DIAGNOSIS — I10 Essential (primary) hypertension: Secondary | ICD-10-CM

## 2011-05-05 DIAGNOSIS — Z23 Encounter for immunization: Secondary | ICD-10-CM

## 2011-05-05 DIAGNOSIS — L089 Local infection of the skin and subcutaneous tissue, unspecified: Secondary | ICD-10-CM

## 2011-05-05 DIAGNOSIS — E119 Type 2 diabetes mellitus without complications: Secondary | ICD-10-CM

## 2011-05-05 DIAGNOSIS — B999 Unspecified infectious disease: Secondary | ICD-10-CM

## 2011-05-05 DIAGNOSIS — F329 Major depressive disorder, single episode, unspecified: Secondary | ICD-10-CM

## 2011-05-05 DIAGNOSIS — E785 Hyperlipidemia, unspecified: Secondary | ICD-10-CM

## 2011-05-05 MED ORDER — PRAVASTATIN SODIUM 20 MG PO TABS: 20.0000 mg | ORAL_TABLET | Freq: Every day | ORAL | Status: DC

## 2011-05-05 MED ORDER — AMITRIPTYLINE HCL 25 MG PO TABS: 25.0000 mg | ORAL_TABLET | Freq: Every day | ORAL | Status: DC

## 2011-05-05 MED ORDER — LISINOPRIL 5 MG PO TABS: 5.0000 mg | ORAL_TABLET | Freq: Every day | ORAL | Status: DC

## 2011-05-05 MED ORDER — FLUOXETINE HCL 20 MG PO CAPS: 20.0000 mg | ORAL_CAPSULE | Freq: Every day | ORAL | Status: DC

## 2011-05-05 NOTE — Patient Instructions (Signed)
1.  Great job on taking your insulin!  You will feel better as the blood sugars are better controlled.  2. Remember the "Befores"  Before breakfast, before lunch, before supper, and before bedtime.  Pick 2 and alternate days.  Make sure you write them down and bring them to your next follow up visit.  3.  Keep a food diary to help you see what you are eating and what you can do to help you cut back to control your diabetes and your weight.  Its a lot of work but you can do it!  4. Restart the Lisinopril 5 mg tablet take 1 tablet daily for your blood pressure.  5.  Restart the Pravastatin 20 mg tablets take 1 tablet daily for your cholesterol  6. Start Prozac (Fluoxetine) 20 mg tablets take 1 tablet daily for your mood.  7.  Start Amitriptyline 25 mg tablets.  Take 1 tablet at bedtime to help you sleep.  8.  Follow up with me on Friday December 21st to recheck your blood pressure and see how you are sleeping.

## 2011-05-05 NOTE — Progress Notes (Signed)
Subjective:   Patient ID: [name] [name] female   DOB: May 23, 1976 35 y.o.   MRN: [id number]  HPI: [name] is a 35 y.o. woman who presents to clinic today for follow up from her hospitalization for cellulitis of her jaw, neck, and face.  She has finished her course of bactrim as directed.  She states that the swelling has completely resolved.    She has very poorly controlled diabetes since she has been out of medication for sometime and has not returned for her follow up appointments.  At the time of admission her A1C was 12.8 and she had not been using her insulin or her oral medication.  She states that since discharge she has been using her insulin as directed.  She has been checking her blood sugars and her lowest reading was 85.  She states that when she gets low she has a jittery feeling, and nervousness.    She states that over the last 6 months her mood has been down. She states that she sas been isolating and that sometimes its been hard to get up to do the things she needs to do or even get out of her room.  She has also not been able to sleep.  She states that she has a problem falling asleep and her that her mind is racing at night. She states that some nights she just lays in bed awake and doesn't sleep at all.    She states that since starting the new medication from the hospital that she has not had a recurrence of the right sided chest pain.  She denies any SOB, abdominal pain, nausea, vomiting, diarrhea, or regurgitation.    She states that she would like a flu shot today.   Past Medical History  Diagnosis Date  . Diabetes mellitus     Type 2  . Hyperlipidemia   . Hypertension    Current Outpatient Prescriptions  Medication Sig Dispense Refill  . FLUoxetine (PROZAC) 20 MG capsule Take 1 capsule (20 mg total) by mouth daily.  30 capsule  0  . insulin NPH-insulin regular (NOVOLIN 70/30) (70-30) 100 UNIT/ML injection Inject 20 Units into the skin 2 (two)  times daily with a meal.  10 mL  0  . lisinopril (PRINIVIL,ZESTRIL) 5 MG tablet Take 1 tablet (5 mg total) by mouth daily.  30 tablet  0  . omeprazole (PRILOSEC) 20 MG capsule Take 1 capsule (20 mg total) by mouth daily.  30 capsule  0  . pravastatin (PRAVACHOL) 40 MG tablet Take 1 tablet (40 mg total) by mouth daily.  30 tablet  0   Family History  Problem Relation Age of Onset  . Heart failure Mother   . Diabetes Mother   . Kidney disease Mother   . Kidney disease Father    History   Social History  . Marital Status: Single    Spouse Name: N/A    Number of Children: N/A  . Years of Education: N/A   Occupational History  . telemarketer     Phone surveys   Social History Main Topics  . Smoking status: Current Some Day Smoker -- 1.0 packs/day  . Smokeless tobacco: Not on file  . Alcohol Use: No  . Drug Use: No  . Sexually Active: Not on file   Other Topics Concern  . Not on file   Social History Narrative   Lives with mother currently,    Review of Systems: Negative except as  noted in the HPI.   Objective:  Physical Exam: Filed Vitals:   05/05/11 0957  BP: 164/104  Pulse: 94  Temp: 97 F (36.1 C)  TempSrc: Oral  Height: 5\' 8"  (1.727 m)  Weight: 317 lb 12.8 oz (144.153 kg)   Constitutional: Vital signs reviewed.  Patient is a well-developed and well-nourished woman in no acute distress and cooperative with exam. Alert and oriented x3.  Head: Normocephalic and atraumatic Ear: TM normal bilaterally Mouth: no erythema or exudates, MMM Eyes: PERRL, EOMI, conjunctivae normal, No scleral icterus.  Neck: Supple, Trachea midline normal ROM, No JVD, mass, thyromegaly, or carotid bruit present.  Cardiovascular: RRR, S1 normal, S2 normal, no MRG, pulses symmetric and intact bilaterally Pulmonary/Chest: CTAB, no wheezes, rales, or rhonchi Abdominal: Soft. Mild epigastric tenderness to palpation, non-distended, bowel sounds are normal, no masses, organomegaly, or guarding  present.  GU: no CVA tenderness Musculoskeletal: No joint deformities, erythema, or stiffness, ROM full and no nontender Hematology: no cervical, inginal, or axillary adenopathy.  Neurological: A&O x3, Strength is normal and symmetric bilaterally, cranial nerve II-XII are grossly intact, no focal motor deficit, sensory intact to light touch bilaterally.  Skin: Warm, dry and intact. No rash, cyanosis, or clubbing.  Psychiatric: normal mood but flat affect. speech and behavior is normal. Judgment and thought content normal. Cognition and memory are normal.   Assessment & Plan:

## 2011-05-09 LAB — GLUCOSE, CAPILLARY

## 2011-05-13 ENCOUNTER — Encounter: Payer: Self-pay | Admitting: Internal Medicine

## 2011-06-02 DIAGNOSIS — F329 Major depressive disorder, single episode, unspecified: Secondary | ICD-10-CM | POA: Insufficient documentation

## 2011-06-02 NOTE — Assessment & Plan Note (Signed)
Her infection has completely resolved.  It was likely precipitated by her poorly controlled diabetes.

## 2011-06-02 NOTE — Assessment & Plan Note (Signed)
She has several of the cardinal symptoms of depression and has a personal history when she was a teenager.  We will start her on Prozac for her mood.  She has also had problems with sleep for sometime.  She has tried [place] in the past and doesn't like how it makes her feel the next day so we will start with amitriptyline 25 mg Qhs and see her back in a few weeks to see how she is sleeping and feeling.  She was counseled to call if she has any thoughts of hurting herself or others or if her depression worsens.

## 2011-06-02 NOTE — Assessment & Plan Note (Signed)
Lab Results  Component Value Date   NA 135 04/14/2011   K 4.4 04/14/2011   CL 103 04/14/2011   CO2 26 04/14/2011   BUN 7 04/14/2011   CREATININE 0.65 04/18/2011    BP Readings from Last 3 Encounters:  05/05/11 164/104  04/19/11 128/86  12/13/07 116/82    Assessment: Hypertension control:  moderately elevated  Progress toward goals:  deteriorated Barriers to meeting goals:  lack of insurance and nonadherence to medications  Plan: Hypertension treatment:  continue current medications She has not taken her medication for the day.  We will follow for a bit to see how she does since this is the first episode of high blood pressure in a while for her.  She has proteinuria so she is on an ACEi at low doses which has controlled her in the past.

## 2011-06-02 NOTE — Assessment & Plan Note (Signed)
Lab Results  Component Value Date   CHOL 174 04/12/2011   CHOL  Value: 146        ATP III CLASSIFICATION:  <200     mg/dL   Desirable  200-239  mg/dL   Borderline High  >=240    mg/dL   High        03/22/2010   CHOL  Value: 179        ATP III CLASSIFICATION:  <200     mg/dL   Desirable  200-239  mg/dL   Borderline High  >=240    mg/dL   High        02/17/2009   Lab Results  Component Value Date   HDL 42 04/12/2011   HDL 26* 03/22/2010   HDL 18* 02/17/2009   Lab Results  Component Value Date   LDLCALC 104* 04/12/2011   LDLCALC  Value: 94        Total Cholesterol/HDL:CHD Risk Coronary Heart Disease Risk Table                     Men   Women  1/2 Average Risk   3.4   3.3  Average Risk       5.0   4.4  2 X Average Risk   9.6   7.1  3 X Average Risk  23.4   11.0        Use the calculated Patient Ratio above and the CHD Risk Table to determine the patient's CHD Risk.        ATP III CLASSIFICATION (LDL):  <100     mg/dL   Optimal  100-129  mg/dL   Near or Above                    Optimal  130-159  mg/dL   Borderline  160-189  mg/dL   High  >190     mg/dL   Very High 03/22/2010   LDLCALC  Value: 125        Total Cholesterol/HDL:CHD Risk Coronary Heart Disease Risk Table                     Men   Women  1/2 Average Risk   3.4   3.3  Average Risk       5.0   4.4  2 X Average Risk   9.6   7.1  3 X Average Risk  23.4   11.0        Use the calculated Patient Ratio above and the CHD Risk Table to determine the patient's CHD Risk.        ATP III CLASSIFICATION (LDL):  <100     mg/dL   Optimal  100-129  mg/dL   Near or Above                    Optimal  130-159  mg/dL   Borderline  160-189  mg/dL   High  >190     mg/dL   Very High* 02/17/2009   Lab Results  Component Value Date   TRIG 138 04/12/2011   TRIG 131 03/22/2010   TRIG 178* 02/17/2009   Lab Results  Component Value Date   CHOLHDL 4.1 04/12/2011   CHOLHDL 5.6 03/22/2010   CHOLHDL 9.9 02/17/2009   No results found for this basename: LDLDIRECT   Her  LDL is above goal when she presented for the cellulitis but she has not  been taking any medication at that time for a while.  We will cut her Simvastatin dose down to 20 mg daily and follow up in February to see how she is doing.  Her goal LDL is <100.

## 2011-06-02 NOTE — Assessment & Plan Note (Addendum)
Lab Results  Component Value Date   HGBA1C 12.8* 04/11/2011   HGBA1C  Value: 13.4 (NOTE)                                                                       According to the ADA Clinical Practice Recommendations for 2011, when HbA1c is used as a screening test:   >=6.5%   Diagnostic of Diabetes Mellitus           (if abnormal result  is confirmed)  5.7-6.4%   Increased risk of developing Diabetes Mellitus  References:Diagnosis and Classification of Diabetes Mellitus,Diabetes Care,2011,34(Suppl 1):S62-S69 and Standards of Medical Care in         Diabetes - 2011,Diabetes [id number]  (Suppl 1):S11-S61.* 03/21/2010   HGBA1C  Value: 12.9 (NOTE) The ADA recommends the following therapeutic goal for glycemic control related to Hgb A1c measurement: Goal of therapy: <6.5 Hgb A1c  Reference: American Diabetes Association: Clinical Practice Recommendations 2010, Diabetes Care, 2010, 33: (Suppl  1).* 02/16/2009   Lab Results  Component Value Date   MICROALBUR 38.54 Result confirmed by automatic dilution.* 03/21/2010   LDLCALC 104* 04/12/2011   CREATININE 0.65 04/18/2011   Her diabetes has been poorly controlled for sometime.  She states that she has been taking her insulin and that she has been watching her blood sugar.  Today in clinic her CBG was 76.  We will continue watching her blood sugar and leave her insulin doses alone today.  She will need a recheck of her A1C in February which will be the first test of this new compliance.

## 2011-06-10 ENCOUNTER — Encounter: Payer: Self-pay | Admitting: Internal Medicine

## 2011-06-28 ENCOUNTER — Encounter: Payer: Self-pay | Admitting: Internal Medicine

## 2011-07-04 ENCOUNTER — Ambulatory Visit (INDEPENDENT_AMBULATORY_CARE_PROVIDER_SITE_OTHER): Payer: Self-pay | Admitting: Internal Medicine

## 2011-07-04 ENCOUNTER — Encounter: Payer: Self-pay | Admitting: Internal Medicine

## 2011-07-04 VITALS — BP 136/93 | HR 89 | Temp 97.7°F | Ht 70.0 in | Wt 318.7 lb

## 2011-07-04 DIAGNOSIS — E1142 Type 2 diabetes mellitus with diabetic polyneuropathy: Secondary | ICD-10-CM | POA: Insufficient documentation

## 2011-07-04 DIAGNOSIS — IMO0002 Reserved for concepts with insufficient information to code with codable children: Secondary | ICD-10-CM

## 2011-07-04 DIAGNOSIS — I10 Essential (primary) hypertension: Secondary | ICD-10-CM

## 2011-07-04 DIAGNOSIS — R6889 Other general symptoms and signs: Secondary | ICD-10-CM

## 2011-07-04 DIAGNOSIS — F329 Major depressive disorder, single episode, unspecified: Secondary | ICD-10-CM

## 2011-07-04 DIAGNOSIS — E1149 Type 2 diabetes mellitus with other diabetic neurological complication: Secondary | ICD-10-CM

## 2011-07-04 DIAGNOSIS — E785 Hyperlipidemia, unspecified: Secondary | ICD-10-CM

## 2011-07-04 DIAGNOSIS — E119 Type 2 diabetes mellitus without complications: Secondary | ICD-10-CM

## 2011-07-04 MED ORDER — METFORMIN HCL 500 MG PO TABS: 500.0000 mg | ORAL_TABLET | Freq: Two times a day (BID) | ORAL | Status: DC

## 2011-07-04 MED ORDER — FLUOXETINE HCL 20 MG PO CAPS: ORAL_CAPSULE | ORAL | Status: DC

## 2011-07-04 MED ORDER — LISINOPRIL 5 MG PO TABS: 5.0000 mg | ORAL_TABLET | Freq: Every day | ORAL | Status: DC

## 2011-07-04 MED ORDER — AMITRIPTYLINE HCL 25 MG PO TABS: 25.0000 mg | ORAL_TABLET | Freq: Every day | ORAL | Status: DC

## 2011-07-04 MED ORDER — PRAVASTATIN SODIUM 20 MG PO TABS: 20.0000 mg | ORAL_TABLET | Freq: Every day | ORAL | Status: DC

## 2011-07-04 NOTE — Patient Instructions (Signed)
1. Please take Fluoxetine (Prozac) 20 mg daily for the first week, then take 40 mg  (2 pills) starting from second week. 2. Please take all medications as prescribed.  3. If you have worsening of your symptoms or new symptoms arise, please call the clinic [phone]), or go to the ER immediately if symptoms are severe.

## 2011-07-04 NOTE — Progress Notes (Signed)
Subjective:   Patient ID: [name] [name] female   DOB: [birth year] 36 y.o.   MRN: [id number]  HPI:  [name] is a 36 y.o. woman who presents to clinic today for follow up visit.  Patient reports that she lost her job and insurance on 11/12. She run off all her medications 2 weeks ago. She is not taking any medications currently due to financial difficulty.   She has very poorly controlled diabetes. Her A1c was 12.8 on 04/11/11. She supposed to take Novolin 70/30 of 20 U at AM and 20 at evening. However, she admitted to have not taken it regularly. Due to the financial difficulty, she could not afford for taking her insulin regularly. She does not have polyuria. But she said she drinks a lot of water. She report having pain and tingling sensation in both hands and feet.   Regarding her Depression, she said the fluoxetine  20 mg is not very effective. She still feels depressed. Her mood has been down. She denies suicidal ideation. She has also not been able to sleep swell.  She states that she has a problem falling asleep.  Denies fever, chills, headaches,  cough, chest pain, SOB,  abdominal pain,diarrhea, constipation, dysuria, urgency, frequency, hematuria.  Past Medical History  Diagnosis Date  . Diabetes mellitus     Type 2  . Hyperlipidemia   . Hypertension    Current Outpatient Prescriptions  Medication Sig Dispense Refill  . amitriptyline (ELAVIL) 25 MG tablet Take 1 tablet (25 mg total) by mouth at bedtime.  30 tablet  6  . FLUoxetine (PROZAC) 20 MG capsule Please take 20 mg daily for the first week, then take 40 mg  (2 pills) starting from second week.  30 capsule  6  . insulin NPH-insulin regular (NOVOLIN 70/30) (70-30) 100 UNIT/ML injection Inject 20 Units into the skin 2 (two) times daily with a meal.  10 mL  0  . lisinopril (PRINIVIL,ZESTRIL) 5 MG tablet Take 1 tablet (5 mg total) by mouth daily.  30 tablet  6  . metFORMIN (GLUCOPHAGE) 500 MG tablet Take 1  tablet (500 mg total) by mouth 2 (two) times daily with a meal.  60 tablet  11  . pravastatin (PRAVACHOL) 20 MG tablet Take 1 tablet (20 mg total) by mouth daily.  30 tablet  6   Family History  Problem Relation Age of Onset  . Heart failure Mother   . Diabetes Mother   . Kidney disease Mother   . Kidney disease Father    History   Social History  . Marital Status: Single    Spouse Name: N/A    Number of Children: N/A  . Years of Education: N/A   Occupational History  . telemarketer     Phone surveys   Social History Main Topics  . Smoking status: Former Smoker -- 1.0 packs/day  . Smokeless tobacco: None  . Alcohol Use: No  . Drug Use: No  . Sexually Active: None   Other Topics Concern  . None   Social History Narrative   Lives with mother currently,    Review of Systems: General: no fevers, chills, feels depressed. Skin: no rash HEENT: no blurry vision, hearing changes or sore throat Pulm: no dyspnea, coughing, wheezing CV: no chest pain, palpitations, shortness of breath Abd: no nausea/vomiting, abdominal pain, diarrhea/constipation GU: no dysuria, hematuria, polyuria Ext: pain and tingling in hands and feet. Neuro: no weakness. But has numbness and tingling his hands  and feet.  Objective:  Physical Exam: Filed Vitals:   07/04/11 1420  BP: 136/93  Pulse: 89  Temp: 97.7 F (36.5 C)  TempSrc: Oral  Height: 5\' 10"  (1.778 m)  Weight: 318 lb 11.2 oz (144.561 kg)   General: not in acute distress. Looks depressed. HEENT: PERRL, EOMI, no scleral icterus Cardiac: S1/S2, RRR, No murmurs, gallops or rubs Pulm: Good air movement bilaterally, Clear to auscultation bilaterally, No rales, wheezing, rhonchi or rubs. Abd: Soft,  nondistended, nontender, no rebound pain, no organomegaly, BS present Ext: No rashes or edema, 2+DP/PT pulse bilaterally Neuro: alert and oriented X3, cranial nerves II-XII grossly intact, muscle strength 5/5 in all extremeties, sensation to  light touch intact.     Assessment & Plan:   # Depression: she is on 20 mg daily of fluoxetine, but she is still symptomatic. Will increase the dose of fluoxetine from 20 to 40 mg daily. Since she has not taken this medication for 2 weeks. Will let her take 20 mg daily in the first week and then increase the dose to 40 mg from second week. Will give her referral to psychiatry today. Will recheck in 3 weeks.  # DM-II: due to the financial difficulty, she has not taken her insulin for 2 weeks. Will try to help her to apply for [facility] card today. Will start her with 500 mg of Metformin and recheck in 3 weeks.  # Abnormal pap smear: patient had abnormal pap smear with CIN-1 at 2009. Will give her referral to Ob/gyn today.  # HTN: her bp is 136/93 today. Will restart Lisinopril.  # HLD: her LDL was 104 on 04/12/11. Will restart pravastatin today.  # Peripheral diabetic neuropathy: her hand and feet pain and tingling are most likely caused by diabetic peripheral neuropathy. She said current regimen is not very effective. However, she can not afford for Gabapentin or other expensive medications. Will try to control her DM-II and continue Amitriptyline, hoping with better control of her DM-II, her peripheral neuropathy will also get better.  Ivor Costa

## 2011-07-05 NOTE — Progress Notes (Signed)
agree

## 2011-07-06 ENCOUNTER — Encounter: Payer: Self-pay | Admitting: Physician Assistant

## 2011-07-29 ENCOUNTER — Encounter: Payer: Self-pay | Admitting: Dietician

## 2011-07-29 ENCOUNTER — Encounter: Payer: Self-pay | Admitting: Internal Medicine

## 2011-08-12 ENCOUNTER — Encounter: Payer: Self-pay | Admitting: Physician Assistant

## 2011-08-17 NOTE — Progress Notes (Signed)
Addended by: Hulan Fray on: 08/17/2011 07:11 AM   Modules accepted: Orders

## 2011-09-02 ENCOUNTER — Ambulatory Visit (INDEPENDENT_AMBULATORY_CARE_PROVIDER_SITE_OTHER): Payer: Self-pay | Admitting: Internal Medicine

## 2011-09-02 ENCOUNTER — Encounter: Payer: Self-pay | Admitting: Internal Medicine

## 2011-09-02 VITALS — BP 121/87 | HR 90 | Temp 97.6°F | Wt 308.7 lb

## 2011-09-02 DIAGNOSIS — Z79899 Other long term (current) drug therapy: Secondary | ICD-10-CM

## 2011-09-02 DIAGNOSIS — E119 Type 2 diabetes mellitus without complications: Secondary | ICD-10-CM

## 2011-09-02 DIAGNOSIS — E1142 Type 2 diabetes mellitus with diabetic polyneuropathy: Secondary | ICD-10-CM

## 2011-09-02 DIAGNOSIS — E1149 Type 2 diabetes mellitus with other diabetic neurological complication: Secondary | ICD-10-CM

## 2011-09-02 DIAGNOSIS — I10 Essential (primary) hypertension: Secondary | ICD-10-CM

## 2011-09-02 DIAGNOSIS — F329 Major depressive disorder, single episode, unspecified: Secondary | ICD-10-CM

## 2011-09-02 LAB — POCT GLYCOSYLATED HEMOGLOBIN (HGB A1C): Hemoglobin A1C: 12

## 2011-09-02 LAB — GLUCOSE, CAPILLARY: Glucose-Capillary: 193 mg/dL — ABNORMAL HIGH (ref 70–99)

## 2011-09-02 MED ORDER — AMITRIPTYLINE HCL 50 MG PO TABS: 50.0000 mg | ORAL_TABLET | Freq: Every day | ORAL | Status: DC

## 2011-09-02 MED ORDER — METFORMIN HCL 1000 MG PO TABS: 1000.0000 mg | ORAL_TABLET | Freq: Two times a day (BID) | ORAL | Status: DC

## 2011-09-02 MED ORDER — FLUOXETINE HCL 40 MG PO CAPS: 40.0000 mg | ORAL_CAPSULE | Freq: Every day | ORAL | Status: DC

## 2011-09-02 NOTE — Progress Notes (Signed)
Subjective:   Patient ID: [name] [name] female   DOB: 04-26-1976 36 y.o.   MRN: [id number]  HPI: [name] is a 36 y.o. woman who presents to clinic today for follow up from her last appointment.    She states that she has been taking the prozac and her metformin as prescribed.  She has stopped the amitriptyline because she states that it was not helping with her neuropathy pain.  She was taking the 25 mg at bedtime.    She continues to have problems with affording her medications because she is not working and has to ask family members for money to fill her prescriptions.  She is only taking the Metformin because it is free from [facility].  She has been working on trying to get her disability to try to qualify for medicaid.  She continues to have problems with the neuropathy in her feet and now states that she is starting to feel it in the tips of her fingers.  She states that its like her feet are on fire.  She can't wear socks or close toed shoes and can't stand the feeling of the sheets on her feet.  She states that it is constant pain all the time.  She denies any urinary retention, loss of bowel control, or weakness in her feet. She states that the hand pain is limited to the first 3 fingers on both hands and happens throughout the day and at night.  She has not dropped anything but does feel like her fingers are "swollen."    She states that her mood has been poor lately.  She states that she has occasional thoughts of suicide by jumping off of a bridge.  She has not been sleeping well and states that her concentration and appetite are poor.  She has a hard time getting out of bed some days.  She states that her and her mother have been talking about her depression recently.  She states that she would never actually commit suicide, stating that "my mom is helping me and besides my church doesn't support suicide and I don't want to go to hell if I do it."  She contracts  for safety today.  She has been taking the Prozac 20 mg daily.   Past Medical History  Diagnosis Date  . Diabetes mellitus     Type 2  . Hyperlipidemia   . Hypertension    Current Outpatient Prescriptions  Medication Sig Dispense Refill  . amitriptyline (ELAVIL) 50 MG tablet Take 1 tablet (50 mg total) by mouth at bedtime.  30 tablet  6  . FLUoxetine (PROZAC) 40 MG capsule Take 1 capsule (40 mg total) by mouth daily.  30 capsule  6  . lisinopril (PRINIVIL,ZESTRIL) 5 MG tablet Take 1 tablet (5 mg total) by mouth daily.  30 tablet  6  . metFORMIN (GLUCOPHAGE) 1000 MG tablet Take 1 tablet (1,000 mg total) by mouth 2 (two) times daily with a meal.  60 tablet  11  . pravastatin (PRAVACHOL) 20 MG tablet Take 1 tablet (20 mg total) by mouth daily.  30 tablet  6  . DISCONTD: FLUoxetine (PROZAC) 20 MG capsule Please take 20 mg daily for the first week, then take 40 mg  (2 pills) starting from second week.  30 capsule  6  . DISCONTD: metFORMIN (GLUCOPHAGE) 500 MG tablet Take 1 tablet (500 mg total) by mouth 2 (two) times daily with a meal.  60 tablet  11   Family History  Problem Relation Age of Onset  . Heart failure Mother   . Diabetes Mother   . Kidney disease Mother   . Kidney disease Father    History   Social History  . Marital Status: Single    Spouse Name: N/A    Number of Children: N/A  . Years of Education: N/A   Occupational History  . telemarketer     Phone surveys   Social History Main Topics  . Smoking status: Former Smoker -- 1.0 packs/day  . Smokeless tobacco: None  . Alcohol Use: No  . Drug Use: No  . Sexually Active: None   Other Topics Concern  . None   Social History Narrative   Lives with mother currently,    Review of Systems: Constitutional: Denies fever, chills, diaphoresis, appetite change and fatigue.  HEENT: Denies photophobia, eye pain, redness, hearing loss, ear pain, congestion, sore throat, rhinorrhea, sneezing, mouth sores, trouble  swallowing, neck pain, neck stiffness and tinnitus.   Respiratory: Denies SOB, DOE, cough, chest tightness,  and wheezing.   Cardiovascular: Denies chest pain, palpitations and leg swelling.  Gastrointestinal: Denies nausea, vomiting, abdominal pain, diarrhea, constipation, blood in stool and abdominal distention.  Genitourinary: Denies dysuria, urgency, frequency, hematuria, flank pain and difficulty urinating.  Musculoskeletal: Positive for myalgias, neuropathic pain. Denies back pain, joint swelling, arthralgias and gait problem.  Skin: Denies pallor, rash and wound.  Neurological: Denies dizziness, seizures, syncope, weakness, light-headedness, numbness and headaches.  Hematological: Denies adenopathy. Easy bruising, personal or family bleeding history  Psychiatric/Behavioral: Positive for suicidal ideation, mood changes and increase somnolence. Denies confusion, nervousness, and agitation  Objective:  Physical Exam: Filed Vitals:   09/02/11 1520  BP: 121/87  Pulse: 90  Temp: 97.6 F (36.4 C)  TempSrc: Oral  Weight: 308 lb 11.2 oz (140.025 kg)   Constitutional: Vital signs reviewed.  Patient is a well-developed and well-nourished obese woman in mild distress and cooperative with exam. Alert and oriented x3.  Head: Normocephalic and atraumatic Ear: TM normal bilaterally Mouth: no erythema or exudates, MMM Eyes: PERRL, EOMI, conjunctivae normal, No scleral icterus.  Neck: Supple, Trachea midline normal ROM, No JVD, mass, thyromegaly, or carotid bruit present.  Cardiovascular: RRR, S1 normal, S2 normal, no MRG, pulses symmetric and intact bilaterally Pulmonary/Chest: CTAB, no wheezes, rales, or rhonchi Abdominal: Soft. Non-tender, non-distended, bowel sounds are normal, no masses, organomegaly, or guarding present.  GU: no CVA tenderness Musculoskeletal: Median nerve compression test is positive bilaterally.  There is decreased sensation to light touch to the ankle bilaterally.  No  joint deformities, erythema, or stiffness, ROM full and no nontender Hematology: no cervical, inginal, or axillary adenopathy.  Neurological: A&O x3, Strength is normal and symmetric bilaterally, cranial nerve II-XII are grossly intact, no focal motor deficit, sensory intact to light touch bilaterally.  Skin: Warm, dry and intact. No rash, cyanosis, or clubbing.  Psychiatric: Normal mood and affect. speech and behavior is normal. Judgment and thought content normal. Cognition and memory are normal.   Assessment & Plan:

## 2011-09-02 NOTE — Patient Instructions (Addendum)
Items required to complete an Eligibility Application   1. Picture ID (Can't be expired) 2. Current Bill to establish proof of residency 3. W-2 & Tax return (if self-employed include "Schedule C"), if not filing Form 4506 4. 4 current Pay stubs for this year 5. Printout of other income (Social security, unemployment, child support, workmen's comp) 6. Food stamp award letter, if receiving  7. Life Insurance (Need copy of the front page, showing name Ins Co. Name, and face amount). 8. Statement for pension, 401-K, IRS (needs to have current balance) 9. Tax Value for cars, houses, mobile homes, and land (Get from [facility] Tax Department) 10. Disability Paperwork (showing status of case) 11. College students: Print out of [place] received, tuition cost, books, etc. 12. If no Income: Games developer of support for free shelter, money, food, Social research officer, government.  Bring all that you can to your follow up appointment to start the process.  1.  Increase the amitriptyline to 50 mg.  You can take 2 of the 25 mg tablets at bedtime til  You run out then fill for the 50 mg tablets.  2. Take the Metformin to [facility] and fill it there.  It should be free. Take 1 tablet twice daily with meals.   3.  Continue the Prozac 40 mg daily.   4.  Pick up the Pravastatin 20 mg tablets and start those.  5.  Remember you are NOT alone.  If you have any thoughts of hurting yourself or plans please call 911 or the office at [phone].  6.  Make a time to go over to [facility] first thing in the morning to be seen.  Remember to tell them that you are working on getting your [employer] Card to help with medications.  7.  Follow up in about 4 weeks

## 2011-09-07 ENCOUNTER — Encounter: Payer: Self-pay | Admitting: Licensed Clinical Social Worker

## 2011-09-07 ENCOUNTER — Telehealth: Payer: Self-pay | Admitting: Licensed Clinical Social Worker

## 2011-09-07 NOTE — Telephone Encounter (Signed)
CSW placed call to pt to follow up on referral to [facility].  CSW spoke with [name], who states she has yet to go to [place].  CSW informed pt [name] has walk-in hours M-F 8am - 3pm.  Pt states she is doing well at this time and is aware [name] [name] is available.  Pt denies add'l concerns at this time.  Ms. Hubbs provided with CSW contact information.

## 2011-10-14 ENCOUNTER — Encounter: Payer: Self-pay | Admitting: Internal Medicine

## 2011-10-14 ENCOUNTER — Ambulatory Visit (INDEPENDENT_AMBULATORY_CARE_PROVIDER_SITE_OTHER): Payer: Self-pay | Admitting: Internal Medicine

## 2011-10-14 VITALS — BP 156/108 | HR 90 | Temp 97.0°F | Ht 69.0 in | Wt 303.4 lb

## 2011-10-14 DIAGNOSIS — E119 Type 2 diabetes mellitus without complications: Secondary | ICD-10-CM

## 2011-10-14 DIAGNOSIS — E785 Hyperlipidemia, unspecified: Secondary | ICD-10-CM

## 2011-10-14 DIAGNOSIS — G5603 Carpal tunnel syndrome, bilateral upper limbs: Secondary | ICD-10-CM

## 2011-10-14 DIAGNOSIS — I10 Essential (primary) hypertension: Secondary | ICD-10-CM

## 2011-10-14 DIAGNOSIS — E1149 Type 2 diabetes mellitus with other diabetic neurological complication: Secondary | ICD-10-CM

## 2011-10-14 DIAGNOSIS — E1142 Type 2 diabetes mellitus with diabetic polyneuropathy: Secondary | ICD-10-CM

## 2011-10-14 MED ORDER — GLIPIZIDE 10 MG PO TABS: 10.0000 mg | ORAL_TABLET | Freq: Two times a day (BID) | ORAL | Status: DC

## 2011-10-14 MED ORDER — GABAPENTIN 300 MG PO CAPS: 300.0000 mg | ORAL_CAPSULE | Freq: Three times a day (TID) | ORAL | Status: DC

## 2011-10-14 NOTE — Patient Instructions (Addendum)
1. Start Glipizide 10 mg tablets.  Take 1 tablet twice daily  2.  Start the Gabapentin 300 mg tablets  - Take 1 tablet at bedtime for 1 week  - Then take 1 tablet twice daily for 1 week  - Then take 1 tablet three times daily until your follow up  3.  Keep working on the weight loss.  You are moving in the right direction.  4.  Follow up with me in July or August.  5.  Keep going on everything else.  Work to get the information to [employer] for the orange card.

## 2011-10-14 NOTE — Progress Notes (Signed)
Subjective:   Patient ID: [name] [name] female   DOB: 06-Jan-1976 36 y.o.   MRN: [id number]  HPI: [name] [name] is a 36 y.o. woman who presents to clinic for follow up of her chronic medical problems including diabetes, hyperlipidemia, and hypertension.    She states that she hasn't been taking her amitriptyline, pravastatin, or lisinopril.  She has been taking her Prozac and her metformin.    She states that she continues to have problem with pain in her hands, feet, and upper legs.  She states the pain is a burning pain with numbness and cramps.  She states that they come on sometimes with activity, other times without activity.  She can not wear shoes or socks because of the pain and has to have the sheets off her feet at night time.    She states that she has been having numbness in the bilateral first, second, and third fingers.  She states that this happens all time time but mostly at night when she is sleeping.   It has not been waking her from sleep at bedtime.   Past Medical History  Diagnosis Date  . Diabetes mellitus     Type 2  . Hyperlipidemia   . Hypertension    Current Outpatient Prescriptions  Medication Sig Dispense Refill  . amitriptyline (ELAVIL) 50 MG tablet Take 1 tablet (50 mg total) by mouth at bedtime.  30 tablet  6  . FLUoxetine (PROZAC) 40 MG capsule Take 1 capsule (40 mg total) by mouth daily.  30 capsule  6  . lisinopril (PRINIVIL,ZESTRIL) 5 MG tablet Take 1 tablet (5 mg total) by mouth daily.  30 tablet  6  . metFORMIN (GLUCOPHAGE) 1000 MG tablet Take 1 tablet (1,000 mg total) by mouth 2 (two) times daily with a meal.  60 tablet  11  . pravastatin (PRAVACHOL) 20 MG tablet Take 1 tablet (20 mg total) by mouth daily.  30 tablet  6   Family History  Problem Relation Age of Onset  . Heart failure Mother   . Diabetes Mother   . Kidney disease Mother   . Kidney disease Father    History   Social History  . Marital Status: Single    Spouse  Name: N/A    Number of Children: N/A  . Years of Education: N/A   Occupational History  . telemarketer     Phone surveys   Social History Main Topics  . Smoking status: Former Smoker -- 1.0 packs/day  . Smokeless tobacco: None  . Alcohol Use: No  . Drug Use: No  . Sexually Active: None   Other Topics Concern  . None   Social History Narrative   Lives with mother currently,    Review of Systems: Negative except as noted in the HPI.   Objective:  Physical Exam: Filed Vitals:   10/14/11 1330  BP: 156/108  Pulse: 90  Temp: 97 F (36.1 C)  TempSrc: Oral  Height: 5\' 9"  (1.753 m)  Weight: 303 lb 6.4 oz (137.621 kg)   Constitutional: Vital signs reviewed.  Patient is a well-developed and well-nourished obese woman in no acute distress and cooperative with exam. Alert and oriented x3.  Head: Normocephalic and atraumatic Ear: TM normal bilaterally Mouth: no erythema or exudates, MMM Eyes: PERRL, EOMI, conjunctivae normal, No scleral icterus.  Neck: Supple, Trachea midline normal ROM, No JVD, mass, thyromegaly, or carotid bruit present.  Cardiovascular: RRR, S1 normal, S2 normal, no MRG, pulses symmetric  and intact bilaterally Pulmonary/Chest: CTAB, no wheezes, rales, or rhonchi Abdominal: Soft. Non-tender, non-distended, bowel sounds are normal, no masses, organomegaly, or guarding present.  GU: no CVA tenderness Musculoskeletal: bilateral median nerve compression test positive left worse then right.  Positive tinnel's sign noted on left but negative on the right.  No thenar wasting noted.  No joint deformities, erythema, or stiffness, ROM full and no nontender Hematology: no cervical, inginal, or axillary adenopathy.  Neurological: A&O x3, Strength is normal and symmetric bilaterally, cranial nerve II-XII are grossly intact, no focal motor deficit, Mild decreased sensation to light touch over the thenar eminence and first through third finger bilaterally.  Otherwise sensory  intact to light touch bilaterally.  Skin: Warm, dry and intact. No rash, cyanosis, or clubbing.  Psychiatric: depressed mood and flat affect. speech and behavior is normal. Judgment and thought content normal. Cognition and memory are normal.   Assessment & Plan:

## 2011-10-22 NOTE — Assessment & Plan Note (Signed)
Lab Results  Component Value Date   HGBA1C 12.0 09/02/2011   HGBA1C 12.8* 04/11/2011   HGBA1C  Value: 13.4 (NOTE)                                                                       According to the ADA Clinical Practice Recommendations for 2011, when HbA1c is used as a screening test:   >=6.5%   Diagnostic of Diabetes Mellitus           (if abnormal result  is confirmed)  5.7-6.4%   Increased risk of developing Diabetes Mellitus  References:Diagnosis and Classification of Diabetes Mellitus,Diabetes [id number] 1):S62-S69 and Standards of Medical Care in         Diabetes - 2011,Diabetes [id number]  (Suppl 1):S11-S61.* 03/21/2010   Lab Results  Component Value Date   MICROALBUR 38.54 Result confirmed by automatic dilution.* 03/21/2010   LDLCALC 104* 04/12/2011   CREATININE 0.65 04/18/2011   Her A1C today is down some from her last two checks.  She has been taking the metformin 500 mg BID but is still concerned about the cost.  We will have her increase the dose to 1000 mg BID and give her a paper prescription to bring to [facility] which should be free.  We will have her follow up in a month to see how she is doing.

## 2011-10-22 NOTE — Assessment & Plan Note (Signed)
Lab Results  Component Value Date   NA 135 04/14/2011   K 4.4 04/14/2011   CL 103 04/14/2011   CO2 26 04/14/2011   BUN 7 04/14/2011   CREATININE 0.65 04/18/2011    BP Readings from Last 3 Encounters:  09/02/11 121/87  07/04/11 136/93    Assessment: Hypertension control:  controlled  Progress toward goals:  at goal Barriers to meeting goals:  no barriers identified  Plan: Hypertension treatment:  continue current medications

## 2011-10-22 NOTE — Assessment & Plan Note (Signed)
She had stopped the amitriptyline 25 mg because it wasn't helping.  We will have restart it but increase the dose to 50 mg qhs and see if that helps more.

## 2011-10-22 NOTE — Assessment & Plan Note (Signed)
She is depressed today and has had some suicidal ideation but no active plan and states that she would not be able to do that because she "doesn't want to go to hell."  We will increase her prozac today and I encouraged her to contact [facility] to help get her set up with a psychiatrist as well as a Social worker.

## 2011-12-30 ENCOUNTER — Encounter: Payer: Self-pay | Admitting: Internal Medicine

## 2011-12-30 ENCOUNTER — Telehealth: Payer: Self-pay | Admitting: *Deleted

## 2011-12-30 NOTE — Telephone Encounter (Signed)
Pt calls and states she is cancelling her appt today due to not feeling well, she states her lower legs are swollen and hurt to walk i encouraged her to keep her appt or go to ED or urg care but she then states she doesn't have transportation to come to clinic, she is ask if she has h/a, dizziness, weakness, N&V, chest pain or shortness of breath and answers "no" to each question she states she wants to re-schedule for mon and an appt is given to her for mon 8/12 at 1015 dr Doug Sou. She is reminded if she feels she is worse before mon to please call 911 or come to ED or urg care, she is agreeable

## 2012-01-02 ENCOUNTER — Encounter: Payer: Self-pay | Admitting: Internal Medicine

## 2012-01-02 ENCOUNTER — Ambulatory Visit (INDEPENDENT_AMBULATORY_CARE_PROVIDER_SITE_OTHER): Payer: Self-pay | Admitting: Internal Medicine

## 2012-01-02 VITALS — BP 139/93 | HR 80 | Temp 97.8°F | Ht 69.0 in | Wt 310.2 lb

## 2012-01-02 DIAGNOSIS — Z79899 Other long term (current) drug therapy: Secondary | ICD-10-CM

## 2012-01-02 DIAGNOSIS — I10 Essential (primary) hypertension: Secondary | ICD-10-CM

## 2012-01-02 DIAGNOSIS — Z23 Encounter for immunization: Secondary | ICD-10-CM

## 2012-01-02 DIAGNOSIS — E1169 Type 2 diabetes mellitus with other specified complication: Secondary | ICD-10-CM

## 2012-01-02 DIAGNOSIS — E1142 Type 2 diabetes mellitus with diabetic polyneuropathy: Secondary | ICD-10-CM

## 2012-01-02 DIAGNOSIS — M7989 Other specified soft tissue disorders: Secondary | ICD-10-CM

## 2012-01-02 DIAGNOSIS — L98499 Non-pressure chronic ulcer of skin of other sites with unspecified severity: Secondary | ICD-10-CM

## 2012-01-02 DIAGNOSIS — E119 Type 2 diabetes mellitus without complications: Secondary | ICD-10-CM

## 2012-01-02 DIAGNOSIS — M25569 Pain in unspecified knee: Secondary | ICD-10-CM

## 2012-01-02 LAB — GLUCOSE, CAPILLARY: Glucose-Capillary: 169 mg/dL — ABNORMAL HIGH (ref 70–99)

## 2012-01-02 MED ORDER — LISINOPRIL 5 MG PO TABS: 5.0000 mg | ORAL_TABLET | Freq: Every day | ORAL | Status: DC

## 2012-01-02 MED ORDER — GABAPENTIN 300 MG PO CAPS: 300.0000 mg | ORAL_CAPSULE | Freq: Three times a day (TID) | ORAL | Status: DC

## 2012-01-02 NOTE — Patient Instructions (Addendum)
We would like you to lose at least 10 pounds to help with your sugars. Congratulations on quitting smoking, keep going strong! Your diabetes is still not very well controlled and you need to work on it. Keep your feet propped up to help with swelling. You can take ibuprofen for your knee pain up to 3 times per day. Continue to watch your wound and if it seems like it is getting worse or you get fevers please call our office. Your high sugars are going to keep this from healing well and doing better with sugars and weight loss will help this heal better. Please call our office with any questions or problems at [phone]. Come back in 1-2 months for a follow up. Please try to bring in your ID as soon as you get it.    1500 Calorie Diabetic Diet The 1500 calorie diabetic diet limits calories to 1500 each day. Following this diet and making healthy meal choices can help improve overall health. It controls blood glucose (sugar) levels and can also help lower blood pressure and cholesterol.  SERVING SIZES Measuring foods and serving sizes helps to make sure you are getting the right amount of food. The list below tells how big or small some common serving sizes are.  1 oz.........4 stacked dice.   3 oz........Marland KitchenDeck of cards.   1 tsp.......Marland KitchenTip of little finger.   1 tbs......Marland KitchenMarland KitchenThumb.   2 tbs.......Marland KitchenGolf ball.    cup......Marland KitchenHalf of a fist.   1 cup.......Marland KitchenA fist.  GUIDELINES FOR CHOOSING FOODS The goal of this diet is to eat a variety of foods and limit calories to 1500 each day. This can be done by choosing foods that are low in calories and fat. The diet also suggests eating small amounts of food frequently. Doing this helps control your blood glucose levels, so they do not get too high or too low. Each meal or snack may include a protein food source to help you feel more satisfied. Try to eat about the same amount of food around the same time each day. This includes weekend days, travel days, and  days off work. Space your meals about 4 to 5 hours apart, and add a snack between them, if you wish.  For example, a daily food plan could include breakfast, a morning snack, lunch, dinner, and an evening snack. Healthy meals and snacks have different types of foods, including whole grains, vegetables, fruits, lean meats, poultry, fish, and dairy products. As you plan your meals, select a variety of foods. Choose from the bread and starch, vegetable, fruit, dairy, and meat/protein groups. Examples of foods from each group are listed below, with their suggested serving sizes. Use measuring cups and spoons to become familiar with what a healthy portion looks like. Bread and Starch Each serving equals 15 grams of carbohydrate.  1 slice bread.    bagel.    cup cold cereal (unsweetened).    cup hot cereal or mashed potatoes.   1 small potato (size of a computer mouse).   ? cup cooked pasta or rice.    English muffin.   1 cup broth-based soup.   3 cups of popcorn.   4 to 6 whole-wheat crackers.    cup cooked beans, peas, or corn.  Vegetables Each serving equals 5 grams of carbohydrate.   cup cooked vegetables.   1 cup raw vegetables.    cup tomato or vegetable juice.  Fruit Each serving equals 15 grams of carbohydrate.  1 small apple  or orange.   1  cup watermelon or strawberries.    cup applesauce (no sugar added).   2 tbs raisins.    banana.    cup canned fruit, packed in water or in its own juice.    cup unsweetened fruit juice.  Dairy Each serving equals 12 to 15 grams of carbohydrate.  1 cup fat-free milk.   6 oz artificially sweetened yogurt or plain yogurt.   1 cup low-fat buttermilk.   1 cup soy milk.   1 cup almond milk.  Meat/Protein  1 large egg.   2 to 3 oz meat, poultry, or fish.    cup low-fat cottage cheese.   1 tbs peanut butter.   1 oz low-fat cheese.    cup tuna, packed in water.    cup tofu.  Fat  1 tsp oil.     1 tsp trans-fat-free margarine.   1 tsp butter.   1 tsp mayonnaise.   2 tbs avocado.   1 tbs salad dressing.   1 tbs cream cheese.   2 tbs sour cream.  SAMPLE 1500 CALORIE DIET PLAN Breakfast   whole-wheat English muffin (1 carb serving).   1 tsp trans-fat-free margarine.   1 scrambled egg.   1 cup fat-free milk (1 carb serving).   1 small orange (1 carb serving).  Lunch  Chicken wrap.   1 whole-wheat tortilla, 8-inch (1 carb servings).   2 oz chicken breast, sliced.   2 tbs low-fat salad dressing, such as [place].    cup shredded lettuce.   2 slices tomato.    cup carrot sticks.   1 small apple (1 carb serving).  Afternoon Snack  3 graham cracker squares (1 carb serving).   1 tbs peanut butter.  Dinner  2 oz lean pork chop, broiled.   1 cup brown rice (3 carb servings).    cup steamed carrots.    cup green beans.   1 cup fat-free milk (1 carb serving).   1 tsp trans-fat-free margarine.  Evening Snack   cup low-fat cottage cheese.   1 small peach or pear, sliced (or  cup canned in water) (1 carb serving).  MEAL PLAN You can use this worksheet to help you make a daily meal plan based on the 1500 calorie diabetic diet suggestions. If you are using this plan to help you control your blood glucose, you may interchange carbohydrate containing foods (dairy, starches, and fruits). Select a variety of fresh foods of varying colors and flavors. The total amount of carbohydrate in your meals or snacks is more important than making sure you include all of the food groups every time you eat. You can choose from approximately this many of the following foods to build your day's meals:  6 Starches.   3 Vegetables.   2 Fruits.   2 Dairy.   4 to 6 oz Meat/Protein.   Up to 3 Fats.  Your dietician can use this worksheet to help you decide how many servings and which types of foods are right for you. BREAKFAST Food Group and Servings / Food  Choice Starch _________________________________________________________ Dairy __________________________________________________________ Fruit ___________________________________________________________ Meat/Protein____________________________________________________ Fat ____________________________________________________________ LUNCH Food Group and Servings / Food Choice  Starch _________________________________________________________ Meat/Protein ___________________________________________________ Vegetables _____________________________________________________ Fruit __________________________________________________________ Dairy __________________________________________________________ Fat ____________________________________________________________ [name] [name] Food Group and Servings / Food Choice Dairy __________________________________________________________ Starch _________________________________________________________ Meat/Protein____________________________________________________ [name] [name] ___________________________________________________________ [name] [name] Food Group and Servings / Food Choice Starch _________________________________________________________ Meat/Protein ___________________________________________________ Dairy __________________________________________________________ Vegetable ______________________________________________________ Fruit ___________________________________________________________ Fat  ____________________________________________________________ [name] [name] Food Group and Servings / Food Choice Fruit ___________________________________________________________ Meat/Protein ____________________________________________________ Dairy __________________________________________________________ Starch __________________________________________________________ DAILY TOTALS Starches _________________________ Vegetables  _______________________ Fruits ____________________________ Dairy ____________________________ Meat/Protein_____________________ Fats _____________________________ Document Released: 11/29/2004 Document Revised: 04/28/2011 Document Reviewed: 03/26/2009 [employer] Patient Information 2012 [place].   Exercise to Lose Weight Exercise and a healthy diet may help you lose weight. Your doctor may suggest specific exercises. EXERCISE IDEAS AND TIPS  Choose low-cost things you enjoy doing, such as walking, bicycling, or exercising to workout videos.   Take stairs instead of the elevator.   Walk during your lunch break.   Park your car further away from work or school.   Go to a gym or an exercise class.   Start with 5 to 10 minutes of exercise each day. Build up to 30 minutes of exercise 4 to 6 days a week.   Wear shoes with good support and comfortable clothes.   Stretch before and after working out.   Work out until you breathe harder and your heart beats faster.   Drink extra water when you exercise.   Do not do so much that you hurt yourself, feel dizzy, or get very short of breath.  Exercises that burn about 150 calories:  Running 1  miles in 15 minutes.   Playing volleyball for 45 to 60 minutes.   Washing and waxing a car for 45 to 60 minutes.   Playing touch football for 45 minutes.   Walking 1  miles in 35 minutes.   Pushing a stroller 1  miles in 30 minutes.   Playing basketball for 30 minutes.   Raking leaves for 30 minutes.   Bicycling 5 miles in 30 minutes.   Walking 2 miles in 30 minutes.   Dancing for 30 minutes.   Shoveling snow for 15 minutes.   Swimming laps for 20 minutes.   Walking up stairs for 15 minutes.   Bicycling 4 miles in 15 minutes.   Gardening for 30 to 45 minutes.   Jumping rope for 15 minutes.   Washing windows or floors for 45 to 60 minutes.  Document Released: 06/11/2010 Document Revised: 04/28/2011 Document  Reviewed: 06/11/2010 [facility] Patient Information 2012 [place].

## 2012-01-02 NOTE — Progress Notes (Signed)
Subjective:     Patient ID: [name] [name], female   DOB: 09-Jan-1976, 36 y.o.   MRN: [id number]  HPI The patient is a 36 year old female who comes in today for a visit with followup of diabetes, knee pain, wound on abdomen. She states that she has peripheral neuropathy in her arms and legs however this knee pain seems new and different. She states it's been down the last 2 weeks. She states that she has tried ibuprofen for her without much relief. The pain is worse when she is moving around or putting a lot of weight on her knee. She doesn't recall any inciting incident or trauma to the knee are increased strain or lifting. She does soak the knee which helps. She also is having swelling in her ankles which she states she normally has however not usually this bad. She states that the swelling gets worse when she is up on her feet a lot. She's had no fevers, chills, nausea, vomiting at home. She is not taking the majority of her medications. She states that glipizide and metformin are free and therefore she takes those however no other medications that she takes. She also has a wound on her abdomen that she says been done for last month or 2. It does appear to be healing but she is concerned that it may not be healing quickly enough. No purulent drainage.  Review of Systems  Constitutional: Negative.   Respiratory: Negative.   Cardiovascular: Negative.   Gastrointestinal: Negative.   Musculoskeletal: Positive for myalgias and arthralgias. Negative for back pain, joint swelling and gait problem.  Skin: Positive for wound.       Left lower quadrant of abdomen, 2 x 3 cm wound, clean base, healing.   Neurological: Positive for numbness. Negative for dizziness, tremors, seizures, syncope, facial asymmetry, speech difficulty, weakness, light-headedness and headaches.       Pins and needles sensation in her legs and arms.       Objective:   Physical Exam  Constitutional: She is oriented to person,  place, and time. She appears well-developed and well-nourished.       Morbidly obese  HENT:  Head: Normocephalic and atraumatic.  Eyes: EOM are normal. Pupils are equal, round, and reactive to light.  Neck: Normal range of motion. Neck supple.  Cardiovascular: Normal rate and regular rhythm.   Pulmonary/Chest: Effort normal and breath sounds normal. No respiratory distress.  Abdominal: Soft. Bowel sounds are normal. She exhibits no distension. There is no tenderness.  Musculoskeletal: Normal range of motion. She exhibits edema and tenderness.       Right knee nontender on exam, no effusion or rash or erythema.  Neurological: She is alert and oriented to person, place, and time.  Skin: Skin is warm and dry.       Wound on left lower quadrant, clean base, appears to be healing.       Assessment:   1. Diabetes mellitus type 2-the patient's hemobilia 1C is 10.5 at today's visit. She states that she does take metformin and glipizide. She states that she is unable to afford insulin at this time and would not like to be placed on insulin. Did talk to her about weight loss and did strongly encourage her to lose at least 10 pounds. Did inform her that her diabetes is likely causing her wounds do not heal as quickly and her diabetic nephropathy and that these will get worse if she does not take care of her diabetes.  Did give pneumonia shot at today's visit.  2. Leg swelling-the patient was advised to elevate her legs for this swelling. It does seem like it is dependent edema with increased level of activity and her weight is likely contributing. Also advised that she can use tight stockings to help for some of the water out.  3. Knee pain-the patient's right knee is likely having some arthritis. This is also likely due to increased weight and stress on the joint. No tenderness on exam however she states that with movement it does hurt. No effusion in the knee. Advised her she can use ibuprofen as needed  and also that weight loss would be very helpful in this regard. Advised her of this as arthritis she does need to keep moving and keep using the knee otherwise it will not be as functional.  4. Wound on abdomen-the patient does have a clean appears to be healing ulcer on her left lower abdominal quadrant. Advised that she gently use soap and water to clean it. No fluctuance nothing to I&D at this point. Advised her that we will keep a close watch on it.  5. Disposition-the patient be seen back in one to 2 months. She is missing ID for her [employer] card application and strongly advise to bring that in. Advised to lose at least 10 pounds for her diabetes. Prescription given for lisinopril and gabapentin in case the patient wishes to fill the she said she lost the prescriptions. Did advise her to continue taking her diabetic medications but that likely this would not be enough to control her diabetes. Did give her information about a 1500-calorie diet as well as exercises to help lose weight. Did congratulate her success of smoking cessation however advised her strongly that she needs to stop kool-aid pops as those are affecting her diabetes.

## 2012-01-04 ENCOUNTER — Other Ambulatory Visit: Payer: Self-pay | Admitting: *Deleted

## 2012-01-04 DIAGNOSIS — E785 Hyperlipidemia, unspecified: Secondary | ICD-10-CM

## 2012-01-04 MED ORDER — PRAVASTATIN SODIUM 20 MG PO TABS: 20.0000 mg | ORAL_TABLET | Freq: Every day | ORAL | Status: DC

## 2012-01-04 NOTE — Telephone Encounter (Signed)
Called to pharm 

## 2012-01-25 DIAGNOSIS — G5603 Carpal tunnel syndrome, bilateral upper limbs: Secondary | ICD-10-CM | POA: Insufficient documentation

## 2012-01-25 HISTORY — DX: Carpal tunnel syndrome, bilateral upper limbs: G56.03

## 2012-01-25 NOTE — Assessment & Plan Note (Addendum)
She has peripheral neuropathy in her legs as well as median nerve pathology bilaterally.  We will have her start Gabapentin to see if that helps her pain as well as the numbness in her hands.

## 2012-01-25 NOTE — Assessment & Plan Note (Signed)
Lab Results  Component Value Date   CHOL 174 04/12/2011   HDL 42 04/12/2011   LDLCALC 104* 04/12/2011   TRIG 138 04/12/2011   CHOLHDL 4.1 04/12/2011   She has been off her pravastatin so, while she is due for a recheck of her FLP we will hold off until her next follow up when hopefully she will have been on the medication for a while.  I stressed compliance with the medications.

## 2012-01-25 NOTE — Assessment & Plan Note (Signed)
She has signs of bilateral carpal tunnel.  She is not being awakened at night because of it and she has not had any problems with grip or strength.  We will watch this for now and I also suggested picking up bilateral wrist splints to wear at night to help protect the wrist.

## 2012-01-25 NOTE — Assessment & Plan Note (Signed)
Lab Results  Component Value Date   HGBA1C 12.0 09/02/2011   HGBA1C 12.8* 04/11/2011   Lab Results  Component Value Date   MICROALBUR 38.54 Result confirmed by automatic dilution.* 03/21/2010   LDLCALC 104* 04/12/2011   CREATININE 0.65 04/18/2011   Her a1c has improved very mildly but has not been able to get insulin because of the cost.  Now that her metformin is at max dose we will add Glipizide today which she can get for free at [facility] as well and continue to follow up.

## 2012-01-25 NOTE — Assessment & Plan Note (Signed)
BP Readings from Last 5 Encounters:  10/14/11 156/108  09/02/11 121/87  07/04/11 136/93  05/05/11 164/104   Her blood pressure is outside of her goal today.  She has not been taking her lisinopril.  We will have her restart it and recheck her at her next follow up appointment.

## 2012-02-16 ENCOUNTER — Ambulatory Visit: Payer: Self-pay | Admitting: Internal Medicine

## 2012-02-17 ENCOUNTER — Encounter: Payer: Self-pay | Admitting: Internal Medicine

## 2012-02-20 ENCOUNTER — Encounter: Payer: Self-pay | Admitting: Internal Medicine

## 2012-02-20 ENCOUNTER — Ambulatory Visit (INDEPENDENT_AMBULATORY_CARE_PROVIDER_SITE_OTHER): Payer: Self-pay | Admitting: Internal Medicine

## 2012-02-20 VITALS — BP 142/93 | HR 85 | Temp 97.4°F | Wt 311.2 lb

## 2012-02-20 DIAGNOSIS — E1142 Type 2 diabetes mellitus with diabetic polyneuropathy: Secondary | ICD-10-CM

## 2012-02-20 DIAGNOSIS — E1110 Type 2 diabetes mellitus with ketoacidosis without coma: Secondary | ICD-10-CM

## 2012-02-20 DIAGNOSIS — F329 Major depressive disorder, single episode, unspecified: Secondary | ICD-10-CM

## 2012-02-20 DIAGNOSIS — E1149 Type 2 diabetes mellitus with other diabetic neurological complication: Secondary | ICD-10-CM

## 2012-02-20 DIAGNOSIS — I10 Essential (primary) hypertension: Secondary | ICD-10-CM

## 2012-02-20 MED ORDER — AMITRIPTYLINE HCL 50 MG PO TABS: 50.0000 mg | ORAL_TABLET | Freq: Every day | ORAL | Status: DC

## 2012-02-20 MED ORDER — GLIPIZIDE 10 MG PO TABS: 10.0000 mg | ORAL_TABLET | Freq: Two times a day (BID) | ORAL | Status: DC

## 2012-02-20 NOTE — Patient Instructions (Signed)
1.  Restart Amitriptyline 50 mg tablets.  Take 1/2 tablet at bedtime for a week then increase to 1 full tablet at bedtime until your follow up.  2.  Continue your medications as prescribed  3.  Take the information for [facility] or [facility] and work to get set up with a counselor to help you.  4.  Get the ID taken care of so you can get your [facility] card to help you afford your medications.  5.  Don't GIVE UP!  You can do it!    6.  Follow up with me in December.

## 2012-02-20 NOTE — Progress Notes (Signed)
Subjective:   Patient ID: [name] [name] female   DOB: Oct 04, 1975 36 y.o.   MRN: [id number]  HPI: [name] is a 36 y.o. woman who presents to clinic today for follow up on her chronic medical conditions including diabetes, hypertension, major depression, and diabetic peripheral neuropathy.  See Problem focused Assessment and Plan for full details of her chronic medical conditions.   She still has not gotten her ID which is the last piece that she needs to get her orange card.  In the meantime we are restricted to the 4 dollar list and using coupons from [employer] as to the types of medications that she can afford or have family members buy for her.    Has been getting the metformin and glipizide and taking them as prescribed.  Getting lisinopril and gabapentin as well as the prozac.  Lisinopril has been hit or miss.  Still feeling down.  Not sleeping except every 2-3 nights.  Mood is still down.  Still some fleeting thoughts but no intention.  workin on her ID for the orange card but hasn't gotten it yet.  Past Medical History  Diagnosis Date  . Diabetes mellitus     Type 2  . Hyperlipidemia   . Hypertension    Current Outpatient Prescriptions  Medication Sig Dispense Refill  . FLUoxetine (PROZAC) 40 MG capsule Take 1 capsule (40 mg total) by mouth daily.  30 capsule  6  . gabapentin (NEURONTIN) 300 MG capsule Take 1 capsule (300 mg total) by mouth 3 (three) times daily.  90 capsule  3  . glipiZIDE (GLUCOTROL) 10 MG tablet Take 1 tablet (10 mg total) by mouth 2 (two) times daily before a meal.  60 tablet  6  . lisinopril (PRINIVIL,ZESTRIL) 5 MG tablet Take 1 tablet (5 mg total) by mouth daily.  30 tablet  6  . metFORMIN (GLUCOPHAGE) 1000 MG tablet Take 1 tablet (1,000 mg total) by mouth 2 (two) times daily with a meal.  60 tablet  11  . pravastatin (PRAVACHOL) 20 MG tablet Take 1 tablet (20 mg total) by mouth daily.  90 tablet  3   Family History  Problem Relation  Age of Onset  . Heart failure Mother   . Diabetes Mother   . Kidney disease Mother   . Kidney disease Father    History   Social History  . Marital Status: Single    Spouse Name: N/A    Number of Children: N/A  . Years of Education: N/A   Occupational History  . telemarketer     Phone surveys   Social History Main Topics  . Smoking status: Former Smoker -- 1.0 packs/day  . Smokeless tobacco: None  . Alcohol Use: No  . Drug Use: No  . Sexually Active: None   Other Topics Concern  . None   Social History Narrative   Lives with mother currently,    Review of Systems: Constitutional: Positive for fatigue. Denies fever, chills, diaphoresis, appetite change.  HEENT: Denies photophobia, eye pain, redness, hearing loss, ear pain, congestion, sore throat, rhinorrhea, sneezing, mouth sores, trouble swallowing, neck pain, neck stiffness and tinnitus.   Respiratory: Denies SOB, DOE, cough, chest tightness,  and wheezing.   Cardiovascular: Denies chest pain, palpitations and leg swelling.  Gastrointestinal: Denies nausea, vomiting, abdominal pain, diarrhea, constipation, blood in stool and abdominal distention.  Genitourinary: Denies dysuria, urgency, frequency, hematuria, flank pain and difficulty urinating.  Musculoskeletal: Denies myalgias, back pain, joint swelling, arthralgias  and gait problem.  Skin: Denies pallor, rash and wound.  Neurological: Denies dizziness, seizures, syncope, weakness, light-headedness, numbness and headaches.  Endocrine: Positive for polyuria and polydipsia. Negative for diaphoresis, excessive sweating.  Hematological: Denies adenopathy. Easy bruising, personal or family bleeding history  Psychiatric/Behavioral: Denies suicidal ideation, mood changes, confusion, nervousness, sleep disturbance and agitation  Objective:  Physical Exam: Filed Vitals:   02/20/12 1359  BP: 142/93  Pulse: 85  Temp: 97.4 F (36.3 C)  TempSrc: Oral  Weight: 311 lb 3.2 oz  (141.159 kg)   Constitutional: Vital signs reviewed.  Patient is a well-developed and well-nourished obese woman in no acute distress and cooperative with exam. Alert and oriented x3.  Head: Normocephalic and atraumatic Ear: TM normal bilaterally Mouth: no erythema or exudates, MMM Eyes: PERRL, EOMI, conjunctivae normal, No scleral icterus.  Neck: Supple, Trachea midline normal ROM, No JVD, mass, thyromegaly, or carotid bruit present.  Cardiovascular: RRR, S1 normal, S2 normal, no MRG, pulses symmetric and intact bilaterally Pulmonary/Chest: CTAB, no wheezes, rales, or rhonchi Abdominal: Soft. Non-tender, non-distended, bowel sounds are normal, no masses, organomegaly, or guarding present.  GU: no CVA tenderness Musculoskeletal: No joint deformities, erythema, or stiffness, ROM full and no nontender Hematology: no cervical, inginal, or axillary adenopathy.  Neurological: A&O x3, Strength is normal and symmetric bilaterally, cranial nerve II-XII are grossly intact, no focal motor deficit, sensory intact to light touch bilaterally.  Skin: Warm, dry and intact. No rash, cyanosis, or clubbing.  Psychiatric: Depressed mood and flat affect. speech and behavior is normal. Judgment, insight, and thought content normal. Cognition and memory are normal.   Assessment & Plan:

## 2012-07-06 ENCOUNTER — Encounter (HOSPITAL_COMMUNITY)
Admission: EM | Disposition: A | Discharge status: Nursing Home (Includes ICF) | Payer: Self-pay | Source: Home / Self Care | Attending: Internal Medicine

## 2012-07-06 ENCOUNTER — Inpatient Hospital Stay (HOSPITAL_COMMUNITY)
Admission: EM | Admit: 2012-07-06 | Discharge: 2012-07-26 | DRG: 463 | Disposition: A | Discharge status: Other Acute Inpatient Hospital | Payer: Self-pay | Attending: Internal Medicine | Admitting: Internal Medicine

## 2012-07-06 ENCOUNTER — Encounter (HOSPITAL_COMMUNITY): Payer: Self-pay | Admitting: *Deleted

## 2012-07-06 ENCOUNTER — Encounter (HOSPITAL_COMMUNITY): Payer: Self-pay | Admitting: Certified Registered Nurse Anesthetist

## 2012-07-06 ENCOUNTER — Emergency Department (HOSPITAL_COMMUNITY): Payer: Self-pay | Admitting: Certified Registered Nurse Anesthetist

## 2012-07-06 ENCOUNTER — Emergency Department (HOSPITAL_COMMUNITY): Payer: Self-pay

## 2012-07-06 DIAGNOSIS — I10 Essential (primary) hypertension: Secondary | ICD-10-CM | POA: Diagnosis present

## 2012-07-06 DIAGNOSIS — D649 Anemia, unspecified: Secondary | ICD-10-CM | POA: Diagnosis present

## 2012-07-06 DIAGNOSIS — Z91199 Patient's noncompliance with other medical treatment and regimen due to unspecified reason: Secondary | ICD-10-CM

## 2012-07-06 DIAGNOSIS — K219 Gastro-esophageal reflux disease without esophagitis: Secondary | ICD-10-CM | POA: Diagnosis present

## 2012-07-06 DIAGNOSIS — L0231 Cutaneous abscess of buttock: Secondary | ICD-10-CM | POA: Diagnosis present

## 2012-07-06 DIAGNOSIS — L0390 Cellulitis, unspecified: Secondary | ICD-10-CM

## 2012-07-06 DIAGNOSIS — N179 Acute kidney failure, unspecified: Secondary | ICD-10-CM | POA: Diagnosis present

## 2012-07-06 DIAGNOSIS — G8918 Other acute postprocedural pain: Secondary | ICD-10-CM | POA: Diagnosis present

## 2012-07-06 DIAGNOSIS — L988 Other specified disorders of the skin and subcutaneous tissue: Secondary | ICD-10-CM

## 2012-07-06 DIAGNOSIS — E1142 Type 2 diabetes mellitus with diabetic polyneuropathy: Secondary | ICD-10-CM | POA: Diagnosis present

## 2012-07-06 DIAGNOSIS — N170 Acute kidney failure with tubular necrosis: Secondary | ICD-10-CM | POA: Diagnosis present

## 2012-07-06 DIAGNOSIS — F3289 Other specified depressive episodes: Secondary | ICD-10-CM | POA: Diagnosis present

## 2012-07-06 DIAGNOSIS — F329 Major depressive disorder, single episode, unspecified: Secondary | ICD-10-CM

## 2012-07-06 DIAGNOSIS — Z23 Encounter for immunization: Secondary | ICD-10-CM

## 2012-07-06 DIAGNOSIS — E1159 Type 2 diabetes mellitus with other circulatory complications: Secondary | ICD-10-CM | POA: Diagnosis present

## 2012-07-06 DIAGNOSIS — A419 Sepsis, unspecified organism: Secondary | ICD-10-CM | POA: Diagnosis present

## 2012-07-06 DIAGNOSIS — D62 Acute posthemorrhagic anemia: Secondary | ICD-10-CM | POA: Diagnosis present

## 2012-07-06 DIAGNOSIS — L02419 Cutaneous abscess of limb, unspecified: Secondary | ICD-10-CM | POA: Diagnosis present

## 2012-07-06 DIAGNOSIS — Z6841 Body Mass Index (BMI) 40.0 and over, adult: Secondary | ICD-10-CM

## 2012-07-06 DIAGNOSIS — E785 Hyperlipidemia, unspecified: Secondary | ICD-10-CM | POA: Diagnosis present

## 2012-07-06 DIAGNOSIS — M726 Necrotizing fasciitis: Secondary | ICD-10-CM

## 2012-07-06 DIAGNOSIS — E1010 Type 1 diabetes mellitus with ketoacidosis without coma: Secondary | ICD-10-CM | POA: Diagnosis present

## 2012-07-06 DIAGNOSIS — R6520 Severe sepsis without septic shock: Secondary | ICD-10-CM | POA: Diagnosis not present

## 2012-07-06 DIAGNOSIS — Z9119 Patient's noncompliance with other medical treatment and regimen: Secondary | ICD-10-CM

## 2012-07-06 DIAGNOSIS — F411 Generalized anxiety disorder: Secondary | ICD-10-CM | POA: Diagnosis present

## 2012-07-06 DIAGNOSIS — L02219 Cutaneous abscess of trunk, unspecified: Secondary | ICD-10-CM | POA: Diagnosis present

## 2012-07-06 DIAGNOSIS — E1122 Type 2 diabetes mellitus with diabetic chronic kidney disease: Secondary | ICD-10-CM | POA: Diagnosis present

## 2012-07-06 DIAGNOSIS — F419 Anxiety disorder, unspecified: Secondary | ICD-10-CM | POA: Diagnosis present

## 2012-07-06 DIAGNOSIS — E1151 Type 2 diabetes mellitus with diabetic peripheral angiopathy without gangrene: Secondary | ICD-10-CM | POA: Diagnosis present

## 2012-07-06 DIAGNOSIS — L0291 Cutaneous abscess, unspecified: Secondary | ICD-10-CM

## 2012-07-06 DIAGNOSIS — Z79899 Other long term (current) drug therapy: Secondary | ICD-10-CM

## 2012-07-06 DIAGNOSIS — E119 Type 2 diabetes mellitus without complications: Secondary | ICD-10-CM | POA: Diagnosis present

## 2012-07-06 DIAGNOSIS — E1110 Type 2 diabetes mellitus with ketoacidosis without coma: Secondary | ICD-10-CM | POA: Diagnosis present

## 2012-07-06 DIAGNOSIS — I498 Other specified cardiac arrhythmias: Secondary | ICD-10-CM | POA: Diagnosis present

## 2012-07-06 DIAGNOSIS — E876 Hypokalemia: Secondary | ICD-10-CM | POA: Diagnosis not present

## 2012-07-06 HISTORY — DX: Major depressive disorder, single episode, unspecified: F32.9

## 2012-07-06 HISTORY — PX: IRRIGATION AND DEBRIDEMENT ABSCESS: SHX5252

## 2012-07-06 HISTORY — DX: Anxiety disorder, unspecified: F41.9

## 2012-07-06 HISTORY — DX: Depression, unspecified: F32.A

## 2012-07-06 HISTORY — DX: Cutaneous abscess, unspecified: L02.91

## 2012-07-06 HISTORY — DX: Necrotizing fasciitis: M72.6

## 2012-07-06 LAB — CBC
MCH: 25.9 pg — ABNORMAL LOW (ref 26.0–34.0)
MCHC: 34.4 g/dL (ref 30.0–36.0)
MCV: 75.5 fL — ABNORMAL LOW (ref 78.0–100.0)
Platelets: 254 10*3/uL (ref 150–400)
RDW: 14.2 % (ref 11.5–15.5)
WBC: 11.5 10*3/uL — ABNORMAL HIGH (ref 4.0–10.5)

## 2012-07-06 LAB — CBC WITH DIFFERENTIAL/PLATELET
Eosinophils Absolute: 0 10*3/uL (ref 0.0–0.7)
Eosinophils Relative: 0 % (ref 0–5)
HCT: 31.1 % — ABNORMAL LOW (ref 36.0–46.0)
Lymphocytes Relative: 14 % (ref 12–46)
Lymphs Abs: 1.5 10*3/uL (ref 0.7–4.0)
MCH: 25.6 pg — ABNORMAL LOW (ref 26.0–34.0)
MCV: 76.6 fL — ABNORMAL LOW (ref 78.0–100.0)
Monocytes Absolute: 1.1 10*3/uL — ABNORMAL HIGH (ref 0.1–1.0)
RBC: 4.06 MIL/uL (ref 3.87–5.11)
WBC: 10.3 10*3/uL (ref 4.0–10.5)

## 2012-07-06 LAB — COMPREHENSIVE METABOLIC PANEL
AST: 12 U/L (ref 0–37)
Albumin: 1.7 g/dL — ABNORMAL LOW (ref 3.5–5.2)
Calcium: 9.2 mg/dL (ref 8.4–10.5)
Chloride: 96 mEq/L (ref 96–112)
Creatinine, Ser: 0.71 mg/dL (ref 0.50–1.10)
Total Protein: 7 g/dL (ref 6.0–8.3)

## 2012-07-06 LAB — URINALYSIS, ROUTINE W REFLEX MICROSCOPIC
Glucose, UA: >1000 mg/dL — AB
Ketones, ur: >80 mg/dL — AB
Leukocytes, UA: NEGATIVE
Protein, ur: 100 mg/dL — AB
Urobilinogen, UA: 1 mg/dL (ref 0.0–1.0)

## 2012-07-06 LAB — GLUCOSE, CAPILLARY
Glucose-Capillary: 382 mg/dL — ABNORMAL HIGH (ref 70–99)
Glucose-Capillary: 282 mg/dL — ABNORMAL HIGH (ref 70–99)

## 2012-07-06 LAB — URINE MICROSCOPIC-ADD ON

## 2012-07-06 LAB — BASIC METABOLIC PANEL
BUN: 13 mg/dL (ref 6–23)
Chloride: 94 mEq/L — ABNORMAL LOW (ref 96–112)
GFR calc Af Amer: >90 mL/min (ref >90)
GFR calc non Af Amer: >90 mL/min (ref >90)
Potassium: 4.4 mEq/L (ref 3.5–5.1)
Sodium: 133 mEq/L — ABNORMAL LOW (ref 135–145)

## 2012-07-06 LAB — MAGNESIUM: Magnesium: 1.9 mg/dL (ref 1.5–2.5)

## 2012-07-06 LAB — SEDIMENTATION RATE: Sed Rate: 132 mm/hr — ABNORMAL HIGH (ref 0–22)

## 2012-07-06 SURGERY — IRRIGATION AND DEBRIDEMENT ABSCESS
Anesthesia: General | Site: Buttocks | Laterality: Left | Wound class: Dirty or Infected

## 2012-07-06 MED ORDER — LACTATED RINGERS IV SOLN: INTRAVENOUS | Status: DC

## 2012-07-06 MED ORDER — AMITRIPTYLINE HCL 50 MG PO TABS
50.0000 mg | ORAL_TABLET | Freq: Every day | ORAL | Status: DC
  Administered 2012-07-06 – 2012-07-25 (×20): 50 mg via ORAL
  Filled 2012-07-06 (×21): qty 1

## 2012-07-06 MED ORDER — FENTANYL CITRATE 0.05 MG/ML IJ SOLN
INTRAMUSCULAR | Status: DC | PRN
  Administered 2012-07-06: 100 ug via INTRAVENOUS
  Administered 2012-07-06 (×2): 50 ug via INTRAVENOUS
  Administered 2012-07-06: 25 ug via INTRAVENOUS
  Administered 2012-07-06: 50 ug via INTRAVENOUS
  Administered 2012-07-06: 100 ug via INTRAVENOUS
  Administered 2012-07-06: 50 ug via INTRAVENOUS

## 2012-07-06 MED ORDER — SODIUM CHLORIDE 0.9 % IV SOLN: 250.0000 mL | INTRAVENOUS | Status: DC | PRN

## 2012-07-06 MED ORDER — GLYCOPYRROLATE 0.2 MG/ML IJ SOLN
INTRAMUSCULAR | Status: DC | PRN
  Administered 2012-07-06: 0.6 mg via INTRAVENOUS

## 2012-07-06 MED ORDER — SODIUM CHLORIDE 0.9 % IV SOLN
2000.0000 mg | Freq: Once | INTRAVENOUS | Status: AC
  Administered 2012-07-06: 2000 mg via INTRAVENOUS
  Filled 2012-07-06: qty 2000

## 2012-07-06 MED ORDER — ONDANSETRON HCL 4 MG/2ML IJ SOLN
INTRAMUSCULAR | Status: DC | PRN
  Administered 2012-07-06: 4 mg via INTRAVENOUS

## 2012-07-06 MED ORDER — FLUOXETINE HCL 20 MG PO CAPS: 40.0000 mg | ORAL_CAPSULE | Freq: Every day | ORAL | Status: DC

## 2012-07-06 MED ORDER — LISINOPRIL 5 MG PO TABS
5.0000 mg | ORAL_TABLET | Freq: Every day | ORAL | Status: DC
  Filled 2012-07-06: qty 1

## 2012-07-06 MED ORDER — PROMETHAZINE HCL 25 MG/ML IJ SOLN: 6.2500 mg | INTRAMUSCULAR | Status: DC | PRN

## 2012-07-06 MED ORDER — SODIUM CHLORIDE 0.9 % IV SOLN
INTRAVENOUS | Status: DC
  Filled 2012-07-06: qty 1

## 2012-07-06 MED ORDER — SUCCINYLCHOLINE CHLORIDE 20 MG/ML IJ SOLN
INTRAMUSCULAR | Status: DC | PRN
  Administered 2012-07-06: 160 mg via INTRAVENOUS

## 2012-07-06 MED ORDER — VANCOMYCIN HCL IN DEXTROSE 1-5 GM/200ML-% IV SOLN: 1000.0000 mg | Freq: Two times a day (BID) | INTRAVENOUS | Status: DC

## 2012-07-06 MED ORDER — ONDANSETRON HCL 4 MG/2ML IJ SOLN: 4.0000 mg | Freq: Four times a day (QID) | INTRAMUSCULAR | Status: DC | PRN

## 2012-07-06 MED ORDER — VANCOMYCIN HCL IN DEXTROSE 1-5 GM/200ML-% IV SOLN: 1000.0000 mg | Freq: Once | INTRAVENOUS | Status: DC

## 2012-07-06 MED ORDER — FLUOXETINE HCL 20 MG PO CAPS
40.0000 mg | ORAL_CAPSULE | Freq: Every day | ORAL | Status: DC
  Administered 2012-07-06 – 2012-07-26 (×19): 40 mg via ORAL
  Filled 2012-07-06 (×21): qty 2

## 2012-07-06 MED ORDER — PIPERACILLIN-TAZOBACTAM 3.375 G IVPB
3.3750 g | Freq: Three times a day (TID) | INTRAVENOUS | Status: DC
  Administered 2012-07-07 – 2012-07-11 (×14): 3.375 g via INTRAVENOUS
  Filled 2012-07-06 (×17): qty 50

## 2012-07-06 MED ORDER — AMITRIPTYLINE HCL 50 MG PO TABS: 50.0000 mg | ORAL_TABLET | Freq: Every day | ORAL | Status: DC

## 2012-07-06 MED ORDER — FENTANYL CITRATE 0.05 MG/ML IJ SOLN
INTRAMUSCULAR | Status: AC
  Filled 2012-07-06: qty 2

## 2012-07-06 MED ORDER — INSULIN ASPART 100 UNIT/ML ~~LOC~~ SOLN
0.0000 [IU] | SUBCUTANEOUS | Status: DC
  Administered 2012-07-06: 10 [IU] via SUBCUTANEOUS

## 2012-07-06 MED ORDER — LINEZOLID 2 MG/ML IV SOLN: 600.0000 mg | Freq: Two times a day (BID) | INTRAVENOUS | Status: DC

## 2012-07-06 MED ORDER — VANCOMYCIN HCL 10 G IV SOLR
1250.0000 mg | Freq: Two times a day (BID) | INTRAVENOUS | Status: DC
  Administered 2012-07-07 – 2012-07-08 (×4): 1250 mg via INTRAVENOUS
  Filled 2012-07-06 (×4): qty 1250

## 2012-07-06 MED ORDER — HEPARIN SODIUM (PORCINE) 5000 UNIT/ML IJ SOLN
5000.0000 [IU] | Freq: Three times a day (TID) | INTRAMUSCULAR | Status: DC
  Filled 2012-07-06 (×2): qty 1

## 2012-07-06 MED ORDER — INSULIN ASPART 100 UNIT/ML ~~LOC~~ SOLN
0.0000 [IU] | SUBCUTANEOUS | Status: DC
  Administered 2012-07-06: 11 [IU] via SUBCUTANEOUS
  Administered 2012-07-07: 7 [IU] via SUBCUTANEOUS
  Administered 2012-07-07: 11 [IU] via SUBCUTANEOUS
  Administered 2012-07-07: 15 [IU] via SUBCUTANEOUS
  Administered 2012-07-07: 7 [IU] via SUBCUTANEOUS
  Administered 2012-07-07 (×2): 15 [IU] via SUBCUTANEOUS
  Administered 2012-07-08 (×2): 11 [IU] via SUBCUTANEOUS

## 2012-07-06 MED ORDER — MEPERIDINE HCL 25 MG/ML IJ SOLN: 6.2500 mg | INTRAMUSCULAR | Status: DC | PRN

## 2012-07-06 MED ORDER — LACTATED RINGERS IV SOLN
INTRAVENOUS | Status: DC | PRN
  Administered 2012-07-06: 14:00:00 via INTRAVENOUS

## 2012-07-06 MED ORDER — PIPERACILLIN-TAZOBACTAM 3.375 G IVPB 30 MIN: 3.3750 g | Freq: Four times a day (QID) | INTRAVENOUS | Status: DC

## 2012-07-06 MED ORDER — NEOSTIGMINE METHYLSULFATE 1 MG/ML IJ SOLN
INTRAMUSCULAR | Status: DC | PRN
  Administered 2012-07-06: 4 mg via INTRAVENOUS

## 2012-07-06 MED ORDER — POTASSIUM CHLORIDE IN NACL 20-0.45 MEQ/L-% IV SOLN
INTRAVENOUS | Status: DC
  Administered 2012-07-06: 100 mL/h via INTRAVENOUS
  Filled 2012-07-06 (×3): qty 1000

## 2012-07-06 MED ORDER — LINEZOLID 2 MG/ML IV SOLN
600.0000 mg | Freq: Two times a day (BID) | INTRAVENOUS | Status: DC
  Filled 2012-07-06: qty 300

## 2012-07-06 MED ORDER — PROPOFOL 10 MG/ML IV BOLUS
INTRAVENOUS | Status: DC | PRN
  Administered 2012-07-06: 200 mg via INTRAVENOUS

## 2012-07-06 MED ORDER — INSULIN ASPART 100 UNIT/ML ~~LOC~~ SOLN: 0.0000 [IU] | Freq: Three times a day (TID) | SUBCUTANEOUS | Status: DC

## 2012-07-06 MED ORDER — HYDROMORPHONE HCL PF 1 MG/ML IJ SOLN
1.0000 mg | Freq: Once | INTRAMUSCULAR | Status: AC
  Administered 2012-07-06: 1 mg via INTRAVENOUS
  Filled 2012-07-06: qty 1

## 2012-07-06 MED ORDER — HYDROCODONE-ACETAMINOPHEN 5-325 MG PO TABS
1.0000 | ORAL_TABLET | ORAL | Status: DC | PRN
  Administered 2012-07-07: 1 via ORAL
  Administered 2012-07-09 – 2012-07-16 (×11): 2 via ORAL
  Filled 2012-07-06: qty 2
  Filled 2012-07-06: qty 1
  Filled 2012-07-06 (×11): qty 2

## 2012-07-06 MED ORDER — FENTANYL CITRATE 0.05 MG/ML IJ SOLN
25.0000 ug | INTRAMUSCULAR | Status: AC | PRN
  Administered 2012-07-06 (×6): 50 ug via INTRAVENOUS

## 2012-07-06 MED ORDER — GLIPIZIDE 10 MG PO TABS
10.0000 mg | ORAL_TABLET | Freq: Two times a day (BID) | ORAL | Status: DC
  Filled 2012-07-06: qty 1

## 2012-07-06 MED ORDER — MORPHINE SULFATE 4 MG/ML IJ SOLN: 4.0000 mg | INTRAMUSCULAR | Status: DC | PRN

## 2012-07-06 MED ORDER — ONDANSETRON HCL 4 MG PO TABS: 4.0000 mg | ORAL_TABLET | Freq: Four times a day (QID) | ORAL | Status: DC | PRN

## 2012-07-06 MED ORDER — PNEUMOCOCCAL VAC POLYVALENT 25 MCG/0.5ML IJ INJ
0.5000 mL | INJECTION | INTRAMUSCULAR | Status: AC
  Administered 2012-07-07: 0.5 mL via INTRAMUSCULAR
  Filled 2012-07-06: qty 0.5

## 2012-07-06 MED ORDER — VANCOMYCIN HCL IN DEXTROSE 1-5 GM/200ML-% IV SOLN
1000.0000 mg | Freq: Once | INTRAVENOUS | Status: AC
  Administered 2012-07-06: 1000 mg via INTRAVENOUS
  Filled 2012-07-06: qty 200

## 2012-07-06 MED ORDER — ROCURONIUM BROMIDE 100 MG/10ML IV SOLN
INTRAVENOUS | Status: DC | PRN
  Administered 2012-07-06 (×2): 5 mg via INTRAVENOUS
  Administered 2012-07-06 (×2): 10 mg via INTRAVENOUS

## 2012-07-06 MED ORDER — METFORMIN HCL 500 MG PO TABS
1000.0000 mg | ORAL_TABLET | Freq: Two times a day (BID) | ORAL | Status: DC
Start: 2012-07-07 — End: 2012-07-06
  Filled 2012-07-06: qty 2

## 2012-07-06 MED ORDER — MIDAZOLAM HCL 2 MG/2ML IJ SOLN: 1.0000 mg | INTRAMUSCULAR | Status: DC | PRN

## 2012-07-06 MED ORDER — LIDOCAINE HCL (CARDIAC) 20 MG/ML IV SOLN
INTRAVENOUS | Status: DC | PRN
  Administered 2012-07-06: 100 mg via INTRAVENOUS

## 2012-07-06 MED ORDER — MIDAZOLAM HCL 5 MG/5ML IJ SOLN
INTRAMUSCULAR | Status: DC | PRN
  Administered 2012-07-06: 2 mg via INTRAVENOUS

## 2012-07-06 MED ORDER — CLINDAMYCIN PHOSPHATE 600 MG/50ML IV SOLN
600.0000 mg | Freq: Once | INTRAVENOUS | Status: AC
  Administered 2012-07-06: 600 mg via INTRAVENOUS
  Filled 2012-07-06: qty 50

## 2012-07-06 MED ORDER — 0.9 % SODIUM CHLORIDE (POUR BTL) OPTIME
TOPICAL | Status: DC | PRN
  Administered 2012-07-06 (×3): 1000 mL

## 2012-07-06 MED ORDER — MORPHINE SULFATE 2 MG/ML IJ SOLN
1.0000 mg | INTRAMUSCULAR | Status: DC | PRN
  Administered 2012-07-06 – 2012-07-07 (×3): 2 mg via INTRAVENOUS
  Administered 2012-07-07: 4 mg via INTRAVENOUS
  Administered 2012-07-07: 2 mg via INTRAVENOUS
  Administered 2012-07-07 – 2012-07-08 (×4): 4 mg via INTRAVENOUS
  Administered 2012-07-08 – 2012-07-09 (×5): 2 mg via INTRAVENOUS
  Administered 2012-07-10: 4 mg via INTRAVENOUS
  Administered 2012-07-10: 2 mg via INTRAVENOUS
  Administered 2012-07-10: 4 mg via INTRAVENOUS
  Administered 2012-07-11: 2 mg via INTRAVENOUS
  Filled 2012-07-06 (×3): qty 2
  Filled 2012-07-06 (×2): qty 1
  Filled 2012-07-06 (×2): qty 2
  Filled 2012-07-06: qty 1
  Filled 2012-07-06: qty 2
  Filled 2012-07-06 (×3): qty 1
  Filled 2012-07-06: qty 2
  Filled 2012-07-06: qty 1
  Filled 2012-07-06: qty 2
  Filled 2012-07-06 (×4): qty 1

## 2012-07-06 MED ORDER — FENTANYL CITRATE 0.05 MG/ML IJ SOLN: 50.0000 ug | INTRAMUSCULAR | Status: DC | PRN

## 2012-07-06 MED ORDER — PIPERACILLIN-TAZOBACTAM 3.375 G IVPB 30 MIN
3.3750 g | Freq: Once | INTRAVENOUS | Status: AC
  Administered 2012-07-06: 3.375 g via INTRAVENOUS
  Filled 2012-07-06: qty 50

## 2012-07-06 SURGICAL SUPPLY — 55 items
APL SKNCLS STERI-STRIP NONHPOA (GAUZE/BANDAGES/DRESSINGS)
BANDAGE GAUZE ELAST BULKY 4 IN (GAUZE/BANDAGES/DRESSINGS) ×1 IMPLANT
BENZOIN TINCTURE PRP APPL 2/3 (GAUZE/BANDAGES/DRESSINGS) IMPLANT
BLADE SURG 10 STRL SS (BLADE) ×1 IMPLANT
BLADE SURG ROTATE 9660 (MISCELLANEOUS) IMPLANT
CANISTER SUCTION 2500CC (MISCELLANEOUS) IMPLANT
CLEANER TIP ELECTROSURG 2X2 (MISCELLANEOUS) ×1 IMPLANT
CLOTH BEACON ORANGE TIMEOUT ST (SAFETY) ×2 IMPLANT
COVER SURGICAL LIGHT HANDLE (MISCELLANEOUS) ×2 IMPLANT
DECANTER SPIKE VIAL GLASS SM (MISCELLANEOUS) IMPLANT
DRAIN PENROSE 1/2X12 LTX STRL (WOUND CARE) IMPLANT
DRAPE LAPAROTOMY TRNSV 102X78 (DRAPE) ×1 IMPLANT
DRAPE PED LAPAROTOMY (DRAPES) ×1 IMPLANT
DRAPE UTILITY 15X26 W/TAPE STR (DRAPE) ×4 IMPLANT
DRSG PAD ABDOMINAL 8X10 ST (GAUZE/BANDAGES/DRESSINGS) ×2 IMPLANT
ELECT REM PT RETURN 9FT ADLT (ELECTROSURGICAL) ×2
ELECTRODE REM PT RTRN 9FT ADLT (ELECTROSURGICAL) ×1 IMPLANT
GLOVE BIOGEL PI IND STRL 7.0 (GLOVE) IMPLANT
GLOVE BIOGEL PI INDICATOR 7.0 (GLOVE) ×1
GLOVE SURG SIGNA 7.5 PF LTX (GLOVE) ×2 IMPLANT
GLOVE SURG SS PI 7.0 STRL IVOR (GLOVE) ×1 IMPLANT
GOWN STRL NON-REIN LRG LVL3 (GOWN DISPOSABLE) ×2 IMPLANT
GOWN STRL REIN XL XLG (GOWN DISPOSABLE) ×2 IMPLANT
KIT BASIN OR (CUSTOM PROCEDURE TRAY) ×2 IMPLANT
KIT ROOM TURNOVER OR (KITS) ×2 IMPLANT
LEGGING LITHOTOMY PAIR STRL (DRAPES) ×1 IMPLANT
NDL HYPO 25GX1X1/2 BEV (NEEDLE) IMPLANT
NEEDLE HYPO 25GX1X1/2 BEV (NEEDLE) IMPLANT
NS IRRIG 1000ML POUR BTL (IV SOLUTION) ×2 IMPLANT
PACK SURGICAL SETUP 50X90 (CUSTOM PROCEDURE TRAY) ×2 IMPLANT
PAD ARMBOARD 7.5X6 YLW CONV (MISCELLANEOUS) ×3 IMPLANT
PENCIL BUTTON HOLSTER BLD 10FT (ELECTRODE) ×2 IMPLANT
SPECIMEN JAR SMALL (MISCELLANEOUS) ×2 IMPLANT
SPONGE GAUZE 4X4 12PLY (GAUZE/BANDAGES/DRESSINGS) ×1 IMPLANT
SPONGE INTESTINAL PEANUT (DISPOSABLE) IMPLANT
SPONGE LAP 18X18 X RAY DECT (DISPOSABLE) ×2 IMPLANT
SPONGE LAP 4X18 X RAY DECT (DISPOSABLE) ×1 IMPLANT
STAPLER VISISTAT 35W (STAPLE) IMPLANT
STRIP CLOSURE SKIN 1/2X4 (GAUZE/BANDAGES/DRESSINGS) IMPLANT
SUT CHROMIC 0 SH (SUTURE) IMPLANT
SUT CHROMIC 2 0 TIES 18 (SUTURE) ×3 IMPLANT
SUT NOVA 0 T19/GS 22DT (SUTURE) ×4 IMPLANT
SUT VIC AB 2-0 SH 27 (SUTURE)
SUT VIC AB 2-0 SH 27X BRD (SUTURE) IMPLANT
SUT VIC AB 3-0 SH 18 (SUTURE) ×2 IMPLANT
SUT VIC AB 5-0 PS2 18 (SUTURE) ×2 IMPLANT
SWAB CULTURE LIQUID MINI MALE (MISCELLANEOUS) ×1 IMPLANT
SYR BULB 3OZ (MISCELLANEOUS) ×2 IMPLANT
SYR CONTROL 10ML LL (SYRINGE) IMPLANT
TOWEL OR 17X24 6PK STRL BLUE (TOWEL DISPOSABLE) ×2 IMPLANT
TOWEL OR 17X26 10 PK STRL BLUE (TOWEL DISPOSABLE) ×2 IMPLANT
TUBE ANAEROBIC SPECIMEN COL (MISCELLANEOUS) ×1 IMPLANT
TUBE CONNECTING 12X1/4 (SUCTIONS) ×1 IMPLANT
WATER STERILE IRR 1000ML POUR (IV SOLUTION) ×2 IMPLANT
YANKAUER SUCT BULB TIP NO VENT (SUCTIONS) ×1 IMPLANT

## 2012-07-06 NOTE — ED Provider Notes (Signed)
History     CSN: [id number]  Arrival date & time 07/06/12  [id number]   First MD Initiated Contact with Patient 07/06/12 1022      Chief Complaint  Patient presents with  . Rash    (Consider location/radiation/quality/duration/timing/severity/associated sxs/prior Treatment)  HPI [name] [name] is a 37 y.o. female who was brought in to the ED for concern of L leg pain.  Patient reports that over last two weeks she has had pain and swelling of L thigh.  Then over last few days has noted bad odor and some drainage from inner thigh.  She notes a large mass on her inner thigh that has developed during this time and is starting to track around posteriorly.  Positive fevers and chills.  History of poorly controlled DM.  No other symptoms.  Past Medical History  Diagnosis Date  . Diabetes mellitus     Type 2  . Hyperlipidemia   . Hypertension     History reviewed. No pertinent past surgical history.  Family History  Problem Relation Age of Onset  . Heart failure Mother   . Diabetes Mother   . Kidney disease Mother   . Kidney disease Father     History  Substance Use Topics  . Smoking status: Former Smoker -- 1.00 packs/day  . Smokeless tobacco: Not on file  . Alcohol Use: No    OB History   Grav Para Term Preterm Abortions TAB SAB Ect Mult Living                  Review of Systems  Constitutional: Positive for fever and chills.  HENT: Negative for congestion, rhinorrhea, neck pain and neck stiffness.   Respiratory: Negative for cough and shortness of breath.   Cardiovascular: Negative for chest pain.  Gastrointestinal: Negative for nausea, vomiting, abdominal pain, diarrhea and abdominal distention.  Endocrine: Negative for polyuria.  Genitourinary: Negative for dysuria.  Skin: Negative for rash.  Neurological: Negative for headaches.  Psychiatric/Behavioral: Negative.   All other systems reviewed and are negative.    Allergies  Review of patient's allergies  indicates no known allergies.  Home Medications   Current Outpatient Rx  Name  Route  Sig  Dispense  Refill  . amitriptyline (ELAVIL) 50 MG tablet   Oral   Take 1 tablet (50 mg total) by mouth at bedtime.   30 tablet   6   . FLUoxetine (PROZAC) 40 MG capsule   Oral   Take 1 capsule (40 mg total) by mouth daily.   30 capsule   6   . glipiZIDE (GLUCOTROL) 10 MG tablet   Oral   Take 1 tablet (10 mg total) by mouth 2 (two) times daily before a meal.   60 tablet   6   . lisinopril (PRINIVIL,ZESTRIL) 5 MG tablet   Oral   Take 1 tablet (5 mg total) by mouth daily.   30 tablet   6   . metFORMIN (GLUCOPHAGE) 1000 MG tablet   Oral   Take 1 tablet (1,000 mg total) by mouth 2 (two) times daily with a meal.   60 tablet   11   . miconazole (MONISTAT 7) 2 % vaginal cream   Vaginal   Place 1 applicator vaginally at bedtime.         . pravastatin (PRAVACHOL) 20 MG tablet   Oral   Take 1 tablet (20 mg total) by mouth daily.   90 tablet   3  BP 113/70  Pulse 118  Temp(Src) 98.6 F (37 C) (Oral)  Resp 26  SpO2 100%  Physical Exam  Nursing note and vitals reviewed. Constitutional: She is oriented to person, place, and time. She appears well-developed and well-nourished. No distress.  HENT:  Head: Normocephalic and atraumatic.  Right Ear: External ear normal.  Left Ear: External ear normal.  Nose: Nose normal.  Mouth/Throat: Oropharynx is clear and moist. No oropharyngeal exudate.  Eyes: EOM are normal. Pupils are equal, round, and reactive to light.  Neck: Normal range of motion. Neck supple. No tracheal deviation present.  Cardiovascular: Normal rate.   Pulmonary/Chest: Effort normal and breath sounds normal. No stridor. No respiratory distress. She has no wheezes. She has no rales.  Abdominal: Soft. She exhibits no distension. There is no tenderness. There is no rebound.  Musculoskeletal: Normal range of motion.  LLE: large palpable abscess on L inner thigh and  surrounding erythema and warmth.  Central ulceration with minimal purulent drainage in this same area.   Neurological: She is alert and oriented to person, place, and time.  Skin: Skin is warm and dry. She is not diaphoretic.    ED Course  Procedures (including critical care time)  Labs Reviewed  CBC WITH DIFFERENTIAL - Abnormal; Notable for the following:    Hemoglobin 10.4 (*)    HCT 31.1 (*)    MCV 76.6 (*)    MCH 25.6 (*)    Monocytes Absolute 1.1 (*)    All other components within normal limits  BASIC METABOLIC PANEL - Abnormal; Notable for the following:    Sodium 133 (*)    Chloride 94 (*)    CO2 13 (*)    Glucose, Bld 380 (*)    All other components within normal limits  SEDIMENTATION RATE - Abnormal; Notable for the following:    Sed Rate 132 (*)    All other components within normal limits  URINALYSIS, ROUTINE W REFLEX MICROSCOPIC - Abnormal; Notable for the following:    APPearance CLOUDY (*)    Glucose, UA >1000 (*)    Hgb urine dipstick SMALL (*)    Bilirubin Urine MODERATE (*)    Ketones, ur >80 (*)    Protein, ur 100 (*)    All other components within normal limits  URINE MICROSCOPIC-ADD ON - Abnormal; Notable for the following:    Bacteria, UA FEW (*)    Casts GRANULAR CAST (*)    All other components within normal limits  GLUCOSE, CAPILLARY - Abnormal; Notable for the following:    Glucose-Capillary 382 (*)    All other components within normal limits  CULTURE, BLOOD (ROUTINE X 2)  CULTURE, BLOOD (ROUTINE X 2)  WOUND CULTURE  ANAEROBIC CULTURE  LACTIC ACID, PLASMA  C-REACTIVE PROTEIN  HIV ANTIBODY (ROUTINE TESTING)  POCT PREGNANCY, URINE   Dg Chest Port 1 View  07/06/2012  *RADIOLOGY REPORT*  Clinical Data: Preop for debridement of wound infection, cough and chills  PORTABLE CHEST - 1 VIEW  Comparison: Chest x-ray of 04/11/2011  Findings: No infiltrate or effusion is seen.  The heart is within normal limits in size.  There are degenerative changes  throughout the thoracic spine.  IMPRESSION: No active lung disease.   Original Report Authenticated By: Ivar Drape, M.D.      1. DKA (diabetic ketoacidoses)   2. Abscess   3. Cellulitis   4. Diabetes   5. Unspecified essential hypertension       MDM   [name] Mew  C [name] is a 37 y.o. female with history of poorly controlled IDDM who presents to the ED for concern of L thigh mass and drainage.  History of MRSA and poorly controlled diabetes.  Large fluctuant abscess with necrosis and purulent discharge on exam.  Workup initiated surgery consulted while workup pending for evaluation.  Antibiotics started preumptively.  Surgery concerned for fornier's.  Zosyn and clindamycin added.  Patient taken to OR from ED.  Patient also noted to be acidotic.  No lactic acidosis.  Patient with significant gap and very poorly controlled IDDM.  Concern for DKA with BG in 300s.  Insulin GTT initiated prior to transfer.  Patient stable for transfer to OR.  Patient admitted.  No other acute concerns while in ED.       Rogelia Mire, MD 07/06/12 1744

## 2012-07-06 NOTE — Preoperative (Signed)
Beta Blockers   Reason not to administer Beta Blockers:Not Applicable 

## 2012-07-06 NOTE — Transfer of Care (Signed)
Immediate Anesthesia Transfer of Care Note  Patient: [name] [name]  Procedure(s) Performed: Procedure(s): IRRIGATION AND DEBRIDEMENT ABSCESS BUTTOCKS AND THIGH (Left)  Patient Location: PACU  Anesthesia Type:General  Level of Consciousness: awake, alert  and oriented  Airway & Oxygen Therapy: Patient Spontanous Breathing and Patient connected to nasal cannula oxygen  Post-op Assessment: Report given to PACU RN and Post -op Vital signs reviewed and stable  Post vital signs: Reviewed and stable  Complications: No apparent anesthesia complications

## 2012-07-06 NOTE — Progress Notes (Deleted)
ANTIBIOTIC CONSULT NOTE - INITIAL  Pharmacy Consult for Vanco/Zosyn Indication: necrotising fasciitis  No Known Allergies  Patient Measurements: Height: 5\' 10"  (177.8 cm) Weight: 297 lb 2.9 oz (134.8 kg) IBW/kg (Calculated) : 68.5 Adjusted Body Weight:   Vital Signs: Temp: 98.9 F (37.2 C) (02/14 1940) Temp src: Oral (02/14 1940) BP: 114/73 mmHg (02/14 1900) Pulse Rate: 117 (02/14 1900) Intake/Output from previous day:   Intake/Output from this shift:    Labs:  Recent Labs  07/06/12 1042  WBC 10.3  HGB 10.4*  PLT 278  CREATININE 0.78   Estimated Creatinine Clearance: 145.8 ml/min (by C-G formula based on Cr of 0.78). No results found for this basename: VANCOTROUGH, VANCOPEAK, VANCORANDOM, GENTTROUGH, GENTPEAK, GENTRANDOM, TOBRATROUGH, TOBRAPEAK, TOBRARND, AMIKACINPEAK, AMIKACINTROU, AMIKACIN,  in the last 72 hours   Microbiology: No results found for this or any previous visit (from the past 720 hour(s)).  Medical History: Past Medical History  Diagnosis Date  . Diabetes mellitus     Type 2  . Hyperlipidemia   . Hypertension   . Anxiety   . Depression   . Abscess     history of multiple abscesses    Medications:  Prescriptions prior to admission  Medication Sig Dispense Refill  . amitriptyline (ELAVIL) 50 MG tablet Take 1 tablet (50 mg total) by mouth at bedtime.  30 tablet  6  . FLUoxetine (PROZAC) 40 MG capsule Take 1 capsule (40 mg total) by mouth daily.  30 capsule  6  . glipiZIDE (GLUCOTROL) 10 MG tablet Take 1 tablet (10 mg total) by mouth 2 (two) times daily before a meal.  60 tablet  6  . lisinopril (PRINIVIL,ZESTRIL) 5 MG tablet Take 1 tablet (5 mg total) by mouth daily.  30 tablet  6  . metFORMIN (GLUCOPHAGE) 1000 MG tablet Take 1 tablet (1,000 mg total) by mouth 2 (two) times daily with a meal.  60 tablet  11  . miconazole (MONISTAT 7) 2 % vaginal cream Place 1 applicator vaginally at bedtime.      . pravastatin (PRAVACHOL) 20 MG tablet Take 1  tablet (20 mg total) by mouth daily.  90 tablet  3   Assessment:    Goal of Therapy:     Plan:    [name] [name] 07/06/2012,9:04 PM

## 2012-07-06 NOTE — Anesthesia Preprocedure Evaluation (Addendum)
Anesthesia Evaluation  Patient identified by MRN, date of birth, ID band Patient awake    Reviewed: Allergy & Precautions, H&P , NPO status , Patient's Chart, lab work & pertinent test results  Airway Mallampati: II TM Distance: >3 FB Neck ROM: Full    Dental no notable dental hx. (+) Dental Advisory Given   Pulmonary neg pulmonary ROS, former smoker,  breath sounds clear to auscultation  Pulmonary exam normal       Cardiovascular hypertension, Pt. on medications negative cardio ROS  Rhythm:Regular Rate:Normal     Neuro/Psych Anxiety Depression negative neurological ROS  negative psych ROS   GI/Hepatic negative GI ROS, Neg liver ROS, GERD-  Controlled,  Endo/Other  negative endocrine ROSdiabetes, Poorly Controlled, Type 2, Oral Hypoglycemic AgentsMorbid obesity  Renal/GU negative Renal ROS  negative genitourinary   Musculoskeletal negative musculoskeletal ROS (+)   Abdominal   Peds negative pediatric ROS (+)  Hematology negative hematology ROS (+)   Anesthesia Other Findings   Reproductive/Obstetrics negative OB ROS                         Anesthesia Physical Anesthesia Plan  ASA: III and emergent  Anesthesia Plan: General   Post-op Pain Management:    Induction: Intravenous  Airway Management Planned: Oral ETT  Additional Equipment:   Intra-op Plan:   Post-operative Plan: Extubation in OR  Informed Consent: I have reviewed the patients History and Physical, chart, labs and discussed the procedure including the risks, benefits and alternatives for the proposed anesthesia with the patient or authorized representative who has indicated his/her understanding and acceptance.   Dental advisory given  Plan Discussed with: CRNA  Anesthesia Plan Comments:         Anesthesia Quick Evaluation

## 2012-07-06 NOTE — ED Notes (Signed)
Pt has rash in groin area.  Pt has had this for some time.  Pt reported to EMS that she was seen for this in the past and told that she had "cellulitis".  Pt also reported a hx of MRSA.

## 2012-07-06 NOTE — Progress Notes (Signed)
Internal Medicine Teaching Service Interim Progess Note:  We were initially called for admission of [name] [name], a 37 year old female with complaints of left leg pain x2 weeks and found to have large left thigh abscess by ED physician.  Surgery was consulted in ED and she was taken emergently to the OR for severe infection, possible fournier's gangrene of left thigh and buttock.  We were then called by Dr. Lucia Gaskins s/p irrigation and debridement abscess buttocks and thigh 07/06/12 of large necrotic area.  He requested close monitoring for at least 24 hours and also explained that she will likely need to go back to the OR tomorrow if not later this weekend.    -discussed with Dr. Conception Chancy, critical care: admit to ICU from [facility] for further close observation -also discussed with Dr. Lucianne Lei Dam--recommends starting Zyvox and Zosyn at this time.   -we will follow patient once out of ICU.    Signed: Jerene Pitch, MD PGY-I, Internal Medicine Resident Pager: [phone]  07/06/2012,4:21 PM

## 2012-07-06 NOTE — H&P (Signed)
Name: [name] [name] MRN: [id number] DOB: [birth year]    LOS: 0  Referring Provider:  Lucia Gaskins  Reason for Referral:  Necrotizing Fasciitis  PULMONARY / CRITICAL CARE MEDICINE  HPI:  [name] [name] is a 37 year old woman with past medical history of diabetes hypertension depression and anxiety.  She presented to [facility] with a 2 to three-day history of swelling pain and drainage from her left groin.  About 2 weeks ago she noticed a pimple on her left buttock she treated this with warm compresses and soaks.  That lesion drained she thought everything was healing well and in about a week later she developed swelling and pain in her left groin.  This continued to progress until it developed her left groin and labia and buttock and she developed dish water colored drainage.  She was experiencing chills at home but no fevers, however, she did not check her temperature.  She came to the ER when she continued to have pain and drainage.  She also recently developed a dry cough.  She was seen by surgery in the ER who felt that she needed to go to the OR for emergent cleanout.  Postop she was transferred to the neurosurgery ICU for close monitoring overnight.      Past Medical History  Diagnosis Date  . Diabetes mellitus     Type 2  . Hyperlipidemia   . Hypertension   . Anxiety   . Depression   . Abscess     history of multiple abscesses   Past Surgical History  Procedure Laterality Date  . Incision and drainage abscess      multiple I&Ds   Prior to Admission medications   Medication Sig Start Date End Date Taking? Authorizing Provider  amitriptyline (ELAVIL) 50 MG tablet Take 1 tablet (50 mg total) by mouth at bedtime. 02/20/12 02/19/13 Yes Trish Fountain, MD  FLUoxetine (PROZAC) 40 MG capsule Take 1 capsule (40 mg total) by mouth daily. 09/02/11  Yes Trish Fountain, MD  glipiZIDE (GLUCOTROL) 10 MG tablet Take 1 tablet (10 mg total) by mouth 2 (two) times daily before a meal.  02/20/12 02/19/13 Yes Trish Fountain, MD  lisinopril (PRINIVIL,ZESTRIL) 5 MG tablet Take 1 tablet (5 mg total) by mouth daily. 01/02/12  Yes Olga Millers, MD  metFORMIN (GLUCOPHAGE) 1000 MG tablet Take 1 tablet (1,000 mg total) by mouth 2 (two) times daily with a meal. 09/02/11 09/01/12 Yes Trish Fountain, MD  miconazole (MONISTAT 7) 2 % vaginal cream Place 1 applicator vaginally at bedtime.   Yes Historical Provider, MD  pravastatin (PRAVACHOL) 20 MG tablet Take 1 tablet (20 mg total) by mouth daily. 01/04/12  Yes Trish Fountain, MD   Allergies No Known Allergies  Family History Family History  Problem Relation Age of Onset  . Heart failure Mother   . Diabetes Mother   . Kidney disease Mother   . Kidney disease Father    Social History  reports that she has quit smoking. She does not have any smokeless tobacco history on file. She reports that  drinks alcohol. She reports that she does not use illicit drugs.  Review Of Systems:  All systems were reviewed and were negative except as stated in the HPI   Brief patient description:  37 y/o woman with DM, HTN and likely necrotizing fasciitis s/p wash out  Events Since Admission: To OR, placed on insulin drip for possible DKA  Current Status:  Vital Signs: Temp:  [98.6 F (37 C)-99.2  F (37.3 C)] 98.9 F (37.2 C) (02/14 1940) Pulse Rate:  [110-125] 117 (02/14 1900) Resp:  [14-26] 24 (02/14 1900) BP: (102-152)/(62-82) 114/73 mmHg (02/14 1900) SpO2:  [97 %-100 %] 99 % (02/14 1900) Weight:  [134.8 kg (297 lb 2.9 oz)] 134.8 kg (297 lb 2.9 oz) (02/14 1800)  Physical Examination: General:  Obese woman in some pain but no acute distress   Neuro:  Alert and oriented, CNII-XII intact HEENT:  PERRL, EOMI, OP clear Neck:  Supple, no masses Cardiovascular:  Tachycardic, regular rhythm, no mrg Lungs:  CTAB, no wrr Abdomen:  Soft, NTND, no HSM Musculoskeletal:  Incision site dressed and intact Skin:  No rash, no lesions  other than incision site  Principal Problem:   Necrotizing fasciitis Active Problems:   DIABETES MELLITUS, TYPE II   HYPERTENSION   ASSESSMENT AND PLAN  PULMONARY No results found for this basename: PHART, PCO2, PCO2ART, PO2ART, HCO3, O2SAT,  in the last 168 hours Ventilator Settings:   CXR:  No active lung disease ETT:  Not intubated  A:  At risk for pulmonary edema. P:   Continouous pulse ox O2 by nasal canula to maintain O2 sats greater than 92%  CARDIOVASCULAR  Recent Labs Lab 07/06/12 1044  LATICACIDVEN 2.0   ECG:  Sinus tachycardia Lines: PIVs  A: Sinus tachycardia  P:  HR elevation likely due to pain and inflammatory response Continuous cardiac monitoring.   RENAL  Recent Labs Lab 07/06/12 1042  NA 133*  K 4.4  CL 94*  CO2 13*  BUN 13  CREATININE 0.78  CALCIUM 9.7   Intake/Output     02/14 0701 - 02/15 0700   P.O. 120   I.V. (mL/kg) 1000 (7.4)   Total Intake(mL/kg) 1120 (8.3)   Blood 20   Total Output 20   Net +1100        Foley:  Placed 07/06/12   A:  DKA P:   Bicarb 13 on admission No repeat labs post op Repeat BMP Pt currently on insulin drip, continue drip for now  GASTROINTESTINAL No results found for this basename: AST, ALT, ALKPHOS, BILITOT, PROT, ALBUMIN,  in the last 168 hours  A:  NPO after midnight for return to OR tomorrow P:     HEMATOLOGIC  Recent Labs Lab 07/06/12 1042  HGB 10.4*  HCT 31.1*  PLT 278   A:  anemia P:  May be due to dilution from fluids Continue to monitor, if persistent obtain iron studies INFECTIOUS  Recent Labs Lab 07/06/12 1042  WBC 10.3   Cultures: Blood cx, wound cx Antibiotics: Given vanc, zosyn and clinda   A:  Necrotizing fasciitis P:   Continue vancomycin and zosyn - dosing per pharmacy Follow up cx  ENDOCRINE  Recent Labs Lab 07/06/12 1402 07/06/12 1939  GLUCAP 382* 282*   A:  DM, DKA   P:   Bicarb on admission 13 with lactate of 2, likely DKA Insulin  drip, frequent BMPs HgA1c  Controlled on oral hypoglycemics as out pt. Transition to basal bolus insulin regimen when acidosis resolved.  NEUROLOGIC  A:  Post operative pain P:   Morphine 1-4mg  q1h prn pain  BEST PRACTICE / DISPOSITION Level of Care:  ICU Primary Service:  PCCM Consultants:  surgery Code Status:  full Diet:  NPO after midnight DVT Px:  SCDs GI Px:  Not indicated Skin Integrity:  good Social / Family:  Lives independently  I spent 35 minutes of critical care time in the  care of this patient, separate from procedures which are documented elsewhere.   Guy Begin., M.D. Pulmonary and [place] Pager: [phone]  07/06/2012, 8:56 PM

## 2012-07-06 NOTE — ED Provider Notes (Signed)
I spoke to dr Lucia Gaskins with surgery about this patient concerning large thigh abscess in a diabetic He is in the OR and will see afterwards.  He requests to keep her NPO for now She will likely be admitted to [place], MD 07/06/12 1206

## 2012-07-06 NOTE — Progress Notes (Signed)
CBG obtained and blood sugar was 382. 10 units of Novolog insulin given Sub-Q per Dr. Marcell Barlow.

## 2012-07-06 NOTE — Progress Notes (Signed)
Foley catheter ordered to avoid wound contamination

## 2012-07-06 NOTE — Progress Notes (Signed)
Dr Glennon Mac states pt may have additional 300 mcg of fentanyl in pacu, as well as up to 2 mg of versed.

## 2012-07-06 NOTE — Progress Notes (Addendum)
ANTIBIOTIC CONSULT NOTE - INITIAL  Pharmacy Consult for Zosyn + Vanco Indication: Necrotising Fascitis + sepsis  No Known Allergies  Patient Measurements:   Adjusted Body Weight: 141.2 kg (in 9/13)  Vital Signs: Temp: 99.2 F (37.3 C) (02/14 1557) Temp src: Oral (02/14 0929) BP: 130/78 mmHg (02/14 1615) Pulse Rate: 116 (02/14 1615) Intake/Output from previous day:   Intake/Output from this shift: Total I/O In: 1000 [I.V.:1000] Out: 20 [Blood:20]  Labs:  Recent Labs  07/06/12 1042  WBC 10.3  HGB 10.4*  PLT 278  CREATININE 0.78   The CrCl is unknown because both a height and weight (above a minimum accepted value) are required for this calculation. No results found for this basename: VANCOTROUGH, VANCOPEAK, VANCORANDOM, GENTTROUGH, GENTPEAK, GENTRANDOM, TOBRATROUGH, TOBRAPEAK, TOBRARND, AMIKACINPEAK, AMIKACINTROU, AMIKACIN,  in the last 72 hours   Microbiology: No results found for this or any previous visit (from the past 720 hour(s)).  Medical History: Past Medical History  Diagnosis Date  . Diabetes mellitus     Type 2  . Hyperlipidemia   . Hypertension   . Anxiety   . Depression   . Abscess     history of multiple abscesses    Medications:  Prescriptions prior to admission  Medication Sig Dispense Refill  . amitriptyline (ELAVIL) 50 MG tablet Take 1 tablet (50 mg total) by mouth at bedtime.  30 tablet  6  . FLUoxetine (PROZAC) 40 MG capsule Take 1 capsule (40 mg total) by mouth daily.  30 capsule  6  . glipiZIDE (GLUCOTROL) 10 MG tablet Take 1 tablet (10 mg total) by mouth 2 (two) times daily before a meal.  60 tablet  6  . lisinopril (PRINIVIL,ZESTRIL) 5 MG tablet Take 1 tablet (5 mg total) by mouth daily.  30 tablet  6  . metFORMIN (GLUCOPHAGE) 1000 MG tablet Take 1 tablet (1,000 mg total) by mouth 2 (two) times daily with a meal.  60 tablet  11  . miconazole (MONISTAT 7) 2 % vaginal cream Place 1 applicator vaginally at bedtime.      . pravastatin  (PRAVACHOL) 20 MG tablet Take 1 tablet (20 mg total) by mouth daily.  90 tablet  3   Assessment: L thigh abscess 37 y/o obese black female has developed a large L thigh abscess over the last 2 weeks. Patient has a known h/o DM with noncompliance and MRSA. H&P mentions blood in her mucous, epistaxis, cough x 2 weeks. Suspected fournier's gangrene of left thigh and buttock and was taken emergently to the OR for I&D on 07/06/12. Start abx: Vanco and Zosyn with Scr 0.78 and CrCl>100.  PMH: DM, HLD, HTN, anxiety/depression, h/o multiple abscesses, h/o tobacco  Plan:  1. Zyvox 600mg  IV q12h hrs per MD--d/c'd  2. Vanco 1g IV in ER 1122.  Give 2g IV x 1 now then 1250mg  IV q12h hrs. Trough after 3-5 doses at steady state  3. Zosyn 3.375g IV q8 hrs.  Eilene Ghazi Stillinger 07/06/2012,4:37 PM

## 2012-07-06 NOTE — Brief Op Note (Signed)
07/06/2012  3:47 PM  PATIENT:  [name] [name], 37 y.o., female, MRN: [id number]  PREOP DIAGNOSIS:  buttock and thigh abcess  POSTOP DIAGNOSIS:   Necrosis skin and sq fat, upper medial left thigh  PROCEDURE:   Procedure(s): IRRIGATION AND DEBRIDEMENT ABSCESS BUTTOCKS AND THIGH, approx 20 x 8 x 6 cm defect  SURGEON:   Alphonsa Overall, M.D.  ASSISTANT:   none  ANESTHESIA:   general  Anesthesiologist: Montez Hageman, MD CRNA: Laretta Alstrom, CRNA  General  EBL:  150  ml  BLOOD ADMINISTERED: none  DRAINS: none   LOCAL MEDICATIONS USED:   none  SPECIMEN:   Necrotic tissue  COUNTS CORRECT:  YES  INDICATIONS FOR PROCEDURE:  [name] [name] is a 37 y.o. (DOB: [birth year]) AA  female whose primary care physician is PRIBULA,CHRISTOPHER, MD and comes for debridement of medial left thigh abscess/necrosis.   The indications and risks of the surgery were explained to the patient.  The risks include, but are not limited to, infection, bleeding, and nerve injury.  Note dictated to:   [phone]

## 2012-07-06 NOTE — Consult Note (Signed)
[name] [name] 1975/11/07  [id number].   Primary Care MD: Dr. Obie Dredge Requesting MD: Dr. Ripley Fraise Chief Complaint/Reason for Consult: left thigh abscess HPI: This is a 37 yo obese black female who got a "pimple" on her left buttock about 2 weeks ago.  She popped this herself and then did warm compresses and soaking in warm bath water.  It did not help and it progressed and grew over the next 2 weeks.  She admits to some drainage.  She also admits to chills and sweats, but no fevers.  Due to continued pain she came to the [facility] today for further treatment.  She was found to have blood sugars in the 300-400 range, but a normal WBC and neutrophil count.  We have been asked to see her.  Review of Systems: Please see HPI, otherwise she admits to blood in her mucous, epistaxis, cough.  Patient does not regularly take her DM meds.  Family History  Problem Relation Age of Onset  . Heart failure Mother   . Diabetes Mother   . Kidney disease Mother   . Kidney disease Father     Past Medical History  Diagnosis Date  . Diabetes mellitus     Type 2  . Hyperlipidemia   . Hypertension   . Anxiety   . Depression   . Abscess     history of multiple abscesses    Past Surgical History  Procedure Laterality Date  . Incision and drainage abscess      multiple I&Ds    Social History:  reports that she has quit smoking. She does not have any smokeless tobacco history on file. She reports that  drinks alcohol. She reports that she does not use illicit drugs.  Allergies: No Known Allergies   (Not in a hospital admission)  Blood pressure 113/70, pulse 118, temperature 98.6 F (37 C), temperature source Oral, resp. rate 26, SpO2 100.00%. Physical Exam: General: morbidly obese black female who is sitting in bed uncomfortably secondary to pain HEENT: head is normocephalic, atraumatic.  Sclera are noninjected.  PERRL.  Ears and nose without any masses or lesions.  Mouth is pink and  moist Heart: regular rhythm, but tachycardic.  Normal s1,s2. No obvious murmurs, gallops, or rubs noted.  Palpable radial and pedal pulses bilaterally Lungs: CTAB, no wheezes, rhonchi, or rales noted.  Respiratory effort nonlabored Abd: soft, NT, ND, +BS, no masses, hernias, or organomegaly GU: pt has large cellulitis and infection of her left groin extending into her left labia, perineum, and tracking posterior to her left gluteus.  There is significant necrosis from the thigh back to the buttock.  She has cellulitis that extends down her medial anterior thigh.  She has some dishwater type drainage from the necrosis MS: all 4 extremities are symmetrical with no cyanosis, clubbing, or edema. Skin: warm and dry with no masses or rashes.  She has multiple lesions on her arms and legs from small pustules. Psych: A&Ox3 with an appropriate affect.    Results for orders placed during the hospital encounter of 07/06/12 (from the past 48 hour(s))  CBC WITH DIFFERENTIAL     Status: Abnormal   Collection Time    07/06/12 10:42 AM      Result Value Range   WBC 10.3  4.0 - 10.5 K/uL   RBC 4.06  3.87 - 5.11 MIL/uL   Hemoglobin 10.4 (*) 12.0 - 15.0 g/dL   HCT 31.1 (*) 36.0 - 46.0 %   MCV 76.6 (*)  78.0 - 100.0 fL   MCH 25.6 (*) 26.0 - 34.0 pg   MCHC 33.4  30.0 - 36.0 g/dL   RDW 14.0  11.5 - 15.5 %   Platelets 278  150 - 400 K/uL   Neutrophils Relative 74  43 - 77 %   Neutro Abs 7.7  1.7 - 7.7 K/uL   Lymphocytes Relative 14  12 - 46 %   Lymphs Abs 1.5  0.7 - 4.0 K/uL   Monocytes Relative 11  3 - 12 %   Monocytes Absolute 1.1 (*) 0.1 - 1.0 K/uL   Eosinophils Relative 0  0 - 5 %   Eosinophils Absolute 0.0  0.0 - 0.7 K/uL   Basophils Relative 0  0 - 1 %   Basophils Absolute 0.0  0.0 - 0.1 K/uL  BASIC METABOLIC PANEL     Status: Abnormal   Collection Time    07/06/12 10:42 AM      Result Value Range   Sodium 133 (*) 135 - 145 mEq/L   Potassium 4.4  3.5 - 5.1 mEq/L   Chloride 94 (*) 96 - 112  mEq/L   CO2 13 (*) 19 - 32 mEq/L   Glucose, Bld 380 (*) 70 - 99 mg/dL   BUN 13  6 - 23 mg/dL   Creatinine, Ser 0.78  0.50 - 1.10 mg/dL   Calcium 9.7  8.4 - 10.5 mg/dL   GFR calc non Af Amer >90  >90 mL/min   GFR calc Af Amer >90  >90 mL/min   Comment:            The eGFR has been calculated     using the CKD EPI equation.     This calculation has not been     validated in all clinical     situations.     eGFR's persistently     <90 mL/min signify     possible Chronic Kidney Disease.  SEDIMENTATION RATE     Status: Abnormal   Collection Time    07/06/12 10:42 AM      Result Value Range   Sed Rate 132 (*) 0 - 22 mm/hr  LACTIC ACID, PLASMA     Status: None   Collection Time    07/06/12 10:44 AM      Result Value Range   Lactic Acid, Venous 2.0  0.5 - 2.2 mmol/L  URINALYSIS, ROUTINE W REFLEX MICROSCOPIC     Status: Abnormal   Collection Time    07/06/12 12:00 PM      Result Value Range   Color, Urine YELLOW  YELLOW   APPearance CLOUDY (*) CLEAR   Specific Gravity, Urine 1.024  1.005 - 1.030   pH 5.0  5.0 - 8.0   Glucose, UA >1000 (*) NEGATIVE mg/dL   Hgb urine dipstick SMALL (*) NEGATIVE   Bilirubin Urine MODERATE (*) NEGATIVE   Ketones, ur >80 (*) NEGATIVE mg/dL   Protein, ur 100 (*) NEGATIVE mg/dL   Urobilinogen, UA 1.0  0.0 - 1.0 mg/dL   Nitrite NEGATIVE  NEGATIVE   Leukocytes, UA NEGATIVE  NEGATIVE  URINE MICROSCOPIC-ADD ON     Status: Abnormal   Collection Time    07/06/12 12:00 PM      Result Value Range   Squamous Epithelial / LPF RARE  RARE   WBC, UA 0-2  <3 WBC/hpf   RBC / HPF 0-2  <3 RBC/hpf   Bacteria, UA FEW (*) RARE   Casts GRANULAR CAST (*)  NEGATIVE  POCT PREGNANCY, URINE     Status: None   Collection Time    07/06/12 12:11 PM      Result Value Range   Preg Test, Ur NEGATIVE  NEGATIVE   Comment:            THE SENSITIVITY OF THIS     METHODOLOGY IS >24 mIU/mL   No results found.     Assessment/Plan 1. Left thigh and buttock infection,  suspect Fournier's gangrene 2. Uncontrolled DM, possible slight DKA 3. Possible UTI  Plan: 1. Patient will need to emergently go to the OR for a severe infection, possible fournier's gangrene of her left thigh and buttock.  She has already received vanc and is getting her first dose of zosyn. 2. Medicine is admitting this patient, which we agree with. 3. Given the duration and extensiveness of her thigh infection and UTI, it is very odd that he patient has not mounted a WBC or a neutrophil count at all.  The patient does not have a h/o HIV or anything immunocompromising, but wonderful if we should check her.  Will defer to medicine. 4. Pre-op CXR for clearance and to evaluate this chronic cough she has had over the last couple of weeks. 5. I have explained to the patient she will need to go to the operating room for an emergency surgery and will likely have a large open wound.  I did let her know that she may require multiple surgeries.  I discussed the procedure along with risks of bleeding and infection.  She understands and is willing to proceed.   OSBORNE,KELLY E 07/06/2012, 1:05 PM Pager: [phone]  Agree with above. Sees Dr. Trish Fountain in the medicine clinic. Diabetic, but only on oral hypoglycemics. Now with necrotic area in the upper medial left thigh - extent unclear.  Needs debridement under general anesthesia.  Discussed indications and risks of surgery with patient.  The primary concern is how extensive this area is. Not married and no children.  Lives with mother, [name] [name] [id number]. Unemployed x 2 years.  Alphonsa Overall, MD, [facility] Pager: [phone] Office phone:  [phone]

## 2012-07-07 ENCOUNTER — Encounter (HOSPITAL_COMMUNITY): Payer: Self-pay | Admitting: Certified Registered"

## 2012-07-07 ENCOUNTER — Inpatient Hospital Stay (HOSPITAL_COMMUNITY): Payer: Self-pay | Admitting: Certified Registered"

## 2012-07-07 ENCOUNTER — Encounter (HOSPITAL_COMMUNITY)
Admission: EM | Disposition: A | Discharge status: Nursing Home (Includes ICF) | Payer: Self-pay | Source: Home / Self Care | Attending: Internal Medicine

## 2012-07-07 ENCOUNTER — Encounter (HOSPITAL_COMMUNITY): Payer: Self-pay | Admitting: Anesthesiology

## 2012-07-07 DIAGNOSIS — F329 Major depressive disorder, single episode, unspecified: Secondary | ICD-10-CM

## 2012-07-07 DIAGNOSIS — E1010 Type 1 diabetes mellitus with ketoacidosis without coma: Secondary | ICD-10-CM

## 2012-07-07 HISTORY — PX: INCISION AND DRAINAGE OF WOUND: SHX1803

## 2012-07-07 LAB — GLUCOSE, CAPILLARY
Glucose-Capillary: 218 mg/dL — ABNORMAL HIGH (ref 70–99)
Glucose-Capillary: 223 mg/dL — ABNORMAL HIGH (ref 70–99)
Glucose-Capillary: 240 mg/dL — ABNORMAL HIGH (ref 70–99)
Glucose-Capillary: 266 mg/dL — ABNORMAL HIGH (ref 70–99)
Glucose-Capillary: 312 mg/dL — ABNORMAL HIGH (ref 70–99)

## 2012-07-07 LAB — CBC
HCT: 23.7 % — ABNORMAL LOW (ref 36.0–46.0)
Hemoglobin: 8.1 g/dL — ABNORMAL LOW (ref 12.0–15.0)
MCH: 25.6 pg — ABNORMAL LOW (ref 26.0–34.0)
MCHC: 34.2 g/dL (ref 30.0–36.0)
MCV: 75 fL — ABNORMAL LOW (ref 78.0–100.0)
Platelets: 228 K/uL (ref 150–400)
RBC: 3.16 MIL/uL — ABNORMAL LOW (ref 3.87–5.11)
RDW: 14.1 % (ref 11.5–15.5)
WBC: 10.7 K/uL — ABNORMAL HIGH (ref 4.0–10.5)

## 2012-07-07 LAB — C-REACTIVE PROTEIN: CRP: 23 mg/dL — ABNORMAL HIGH (ref <0.60)

## 2012-07-07 LAB — BASIC METABOLIC PANEL
BUN: 15 mg/dL (ref 6–23)
BUN: 17 mg/dL (ref 6–23)
CO2: 20 mEq/L (ref 19–32)
Chloride: 98 mEq/L (ref 96–112)
Chloride: 98 mEq/L (ref 96–112)
Creatinine, Ser: 0.71 mg/dL (ref 0.50–1.10)
Creatinine, Ser: 1.01 mg/dL (ref 0.50–1.10)
GFR calc Af Amer: 82 mL/min — ABNORMAL LOW (ref >90)
GFR calc non Af Amer: 88 mL/min — ABNORMAL LOW (ref >90)
GFR calc non Af Amer: 71 mL/min — ABNORMAL LOW (ref >90)
Glucose, Bld: 322 mg/dL — ABNORMAL HIGH (ref 70–99)
Glucose, Bld: 301 mg/dL — ABNORMAL HIGH (ref 70–99)
Potassium: 3.9 mEq/L (ref 3.5–5.1)
Potassium: 3.9 mEq/L (ref 3.5–5.1)
Sodium: 128 mEq/L — ABNORMAL LOW (ref 135–145)
Sodium: 130 mEq/L — ABNORMAL LOW (ref 135–145)

## 2012-07-07 LAB — HEMOGLOBIN A1C: Mean Plasma Glucose: 395 mg/dL — ABNORMAL HIGH (ref <117)

## 2012-07-07 SURGERY — IRRIGATION AND DEBRIDEMENT WOUND
Anesthesia: General | Site: Thigh | Laterality: Left | Wound class: Dirty or Infected

## 2012-07-07 MED ORDER — 0.9 % SODIUM CHLORIDE (POUR BTL) OPTIME
TOPICAL | Status: DC | PRN
  Administered 2012-07-07: 1000 mL

## 2012-07-07 MED ORDER — LIDOCAINE HCL (CARDIAC) 20 MG/ML IV SOLN
INTRAVENOUS | Status: DC | PRN
  Administered 2012-07-07: 25 mg via INTRAVENOUS

## 2012-07-07 MED ORDER — PROPOFOL 10 MG/ML IV EMUL
INTRAVENOUS | Status: DC | PRN
  Administered 2012-07-07: 180 mL via INTRAVENOUS

## 2012-07-07 MED ORDER — OXYCODONE HCL 5 MG/5ML PO SOLN: 5.0000 mg | Freq: Once | ORAL | Status: DC | PRN

## 2012-07-07 MED ORDER — FENTANYL CITRATE 0.05 MG/ML IJ SOLN
INTRAMUSCULAR | Status: DC | PRN
  Administered 2012-07-07: 50 ug via INTRAVENOUS

## 2012-07-07 MED ORDER — LACTATED RINGERS IV SOLN
INTRAVENOUS | Status: DC | PRN
  Administered 2012-07-07: 12:00:00 via INTRAVENOUS

## 2012-07-07 MED ORDER — HYDROMORPHONE HCL PF 1 MG/ML IJ SOLN
0.2500 mg | INTRAMUSCULAR | Status: DC | PRN
  Administered 2012-07-07 (×2): 0.5 mg via INTRAVENOUS

## 2012-07-07 MED ORDER — HYDROMORPHONE HCL PF 1 MG/ML IJ SOLN
INTRAMUSCULAR | Status: AC
  Administered 2012-07-07: 0.5 mg via INTRAVENOUS
  Filled 2012-07-07: qty 2

## 2012-07-07 MED ORDER — SUCCINYLCHOLINE CHLORIDE 20 MG/ML IJ SOLN
INTRAMUSCULAR | Status: DC | PRN
  Administered 2012-07-07: 100 mg via INTRAVENOUS

## 2012-07-07 MED ORDER — OXYCODONE HCL 5 MG PO TABS: 5.0000 mg | ORAL_TABLET | Freq: Once | ORAL | Status: DC | PRN

## 2012-07-07 MED ORDER — KCL IN DEXTROSE-NACL 20-5-0.45 MEQ/L-%-% IV SOLN
INTRAVENOUS | Status: DC
  Administered 2012-07-07: 15:00:00 via INTRAVENOUS
  Filled 2012-07-07 (×3): qty 1000

## 2012-07-07 MED ORDER — CLINDAMYCIN PHOSPHATE 600 MG/50ML IV SOLN
600.0000 mg | Freq: Three times a day (TID) | INTRAVENOUS | Status: DC
  Administered 2012-07-07 – 2012-07-26 (×60): 600 mg via INTRAVENOUS
  Filled 2012-07-07 (×66): qty 50

## 2012-07-07 SURGICAL SUPPLY — 32 items
BANDAGE GAUZE ELAST BULKY 4 IN (GAUZE/BANDAGES/DRESSINGS) ×1 IMPLANT
BLADE SURG 10 STRL SS (BLADE) ×2 IMPLANT
BLADE SURG 15 STRL LF DISP TIS (BLADE) ×1 IMPLANT
BLADE SURG 15 STRL SS (BLADE) ×2
BLADE SURG ROTATE 9660 (MISCELLANEOUS) IMPLANT
CANISTER SUCTION 2500CC (MISCELLANEOUS) ×1 IMPLANT
CLOTH BEACON ORANGE TIMEOUT ST (SAFETY) ×2 IMPLANT
COVER SURGICAL LIGHT HANDLE (MISCELLANEOUS) ×2 IMPLANT
DRAPE LAPAROTOMY T 102X78X121 (DRAPES) ×2 IMPLANT
ELECT CAUTERY BLADE 6.4 (BLADE) ×2 IMPLANT
ELECT REM PT RETURN 9FT ADLT (ELECTROSURGICAL) ×2
ELECTRODE REM PT RTRN 9FT ADLT (ELECTROSURGICAL) ×1 IMPLANT
GLOVE SURG SIGNA 7.5 PF LTX (GLOVE) ×4 IMPLANT
GOWN PREVENTION PLUS XLARGE (GOWN DISPOSABLE) ×2 IMPLANT
GOWN STRL NON-REIN LRG LVL3 (GOWN DISPOSABLE) ×2 IMPLANT
KIT BASIN OR (CUSTOM PROCEDURE TRAY) ×2 IMPLANT
KIT ROOM TURNOVER OR (KITS) ×2 IMPLANT
NDL HYPO 25GX1X1/2 BEV (NEEDLE) IMPLANT
NEEDLE HYPO 25GX1X1/2 BEV (NEEDLE) IMPLANT
NS IRRIG 1000ML POUR BTL (IV SOLUTION) ×2 IMPLANT
PACK SURGICAL SETUP 50X90 (CUSTOM PROCEDURE TRAY) ×2 IMPLANT
PAD ARMBOARD 7.5X6 YLW CONV (MISCELLANEOUS) ×4 IMPLANT
PENCIL BUTTON HOLSTER BLD 10FT (ELECTRODE) ×2 IMPLANT
SPECIMEN JAR SMALL (MISCELLANEOUS) IMPLANT
SPONGE GAUZE 4X4 12PLY (GAUZE/BANDAGES/DRESSINGS) ×2 IMPLANT
SPONGE LAP 18X18 X RAY DECT (DISPOSABLE) ×2 IMPLANT
TOWEL OR 17X24 6PK STRL BLUE (TOWEL DISPOSABLE) ×2 IMPLANT
TOWEL OR 17X26 10 PK STRL BLUE (TOWEL DISPOSABLE) ×2 IMPLANT
TUBE CONNECTING 12X1/4 (SUCTIONS) ×2 IMPLANT
UNDERPAD 30X30 INCONTINENT (UNDERPADS AND DIAPERS) ×2 IMPLANT
WATER STERILE IRR 1000ML POUR (IV SOLUTION) IMPLANT
YANKAUER SUCT BULB TIP NO VENT (SUCTIONS) ×2 IMPLANT

## 2012-07-07 NOTE — Op Note (Signed)
NAMEDANETRA, COLLVER NO.:  [id number]  MEDICAL RECORD NO.:  [id number]  LOCATION:  3108                         FACILITY:  [facility]  PHYSICIAN:  Fenton Malling. Lucia Gaskins, M.D.  DATE OF BIRTH:  [birth year]  DATE OF PROCEDURE: 06 July 2012                               OPERATIVE REPORT  PREOP DIAGNOSIS: buttock and thigh abcess   POSTOP DIAGNOSIS: Necrosis skin and sq fat, upper medial left thigh   PROCEDURE: Procedure(s): IRRIGATION AND DEBRIDEMENT ABSCESS left perineum AND left THIGH, approx 20 x 8 x 6 cm defect   SURGEON: Alphonsa Overall, M.D.   ASSISTANT: none   ANESTHESIA: general,  Anesthesiologist: Montez Hageman, MD,  CRNA: Laretta Alstrom, CRNA   EBL: 150 ml  BLOOD ADMINISTERED: none  DRAINS: none   LOCAL MEDICATIONS USED: none  SPECIMEN: Necrotic tissue  COUNTS CORRECT: YES   INDICATIONS FOR PROCEDURE:  [name] [name] is a 37 y.o. (DOB: 14-Mar-1976) AA female whose primary care physician is PRIBULA, CHRISTOPHER, MD and comes for debridement of medial left thigh abscess/necrosis.   She has had a pimple in her left buttocks area about 2 weeks ago, has had increasing tenderness and came to the [facility] Emergency Room today for further treatment.  She has blood sugars in the 300 range, but a normal white blood count.  On physical exam, she has a necrotic area in the upper middle aspect of her left thigh not quite in her perineum.  This needs to be debrided and incised and discuss this with the patient.  The indications and risks of the surgery were explained to the patient. The risks include, but are not limited to, infection, bleeding, and nerve injury.  OPERATIVE NOTE:  The patient was taken to room #1, underwent general endotracheal anesthesia.  Supervised by Dr. Montez Hageman.  A time-out was held and a surgical checklist run.  She had at least 3 patchy areas of dead skin in her left perineum and left upper thigh.  The largest area of dead skin was  at the anterior medial thigh.   I resected this dead skin and got into dead and necrotic subcutaneous fat that had a gray dishwater colored.  I debrided area total about 20 cm anterior front, about 8 cm wide, and about 6 cm deep.    I irrigated with 2 L of saline.  I used Bovie electrocautery to control the bleeding and then packed it with saline soaked Kerlix and 4x4s.  She tolerated the procedure well and was transferred to the recovery room in good condition.    I will consider returning her to the OR tomorrow for repeat examination.   Fenton Malling. Lucia Gaskins, M.D., FACS   DHN/MEDQ  D:  07/06/2012  T:  07/07/2012  Job:  [id number]

## 2012-07-07 NOTE — Progress Notes (Signed)
Heparin for DVT prophylaxis discontinued; plan for OR in am  Cont SCD

## 2012-07-07 NOTE — Preoperative (Signed)
Beta Blockers   Reason not to administer Beta Blockers:Not Applicable 

## 2012-07-07 NOTE — Op Note (Signed)
IRRIGATION AND DEBRIDEMENT WOUND  Procedure Note  [name] [name] 07/06/2012 - 07/07/2012   Pre-op Diagnosis: L Thigh Infection Necrosis     Post-op Diagnosis: same  Procedure(s): IRRIGATION AND DEBRIDEMENT WOUND  Surgeon(s): Harl Bowie, MD  Anesthesia: General  Staff:  Circulator: Normajean Glasgow, RN Scrub Person: Lorenza Chick, CST  Estimated Blood Loss: Minimal               Indications: This is a 37 year old female who is one day status post debridement of a large proximal left thigh abscess. The decision has been made to return to the operating room for further debridement  Procedure: The patient was brought to the operating room and identified as the correct patient. She was placed supine on the operating room table and general anesthesia was induced. The patient was then placed in lithotomy position. Her previous bandages and wound packing were then removed. She is found to have necrotic tissue on the medial aspect of the wound for almost the entire length. The worst area was toward the buttock. She was prepped and draped in the usual sterile fashion. I sharply debrided the necrotic tissue on the medial aspect of the wound with the electrocautery. I then achieved hemostasis with cautery. I then packed the wound with 4 inch Kerlix. ABDs and dry gauze were then applied. The patient was in x-ray the operating room and taken in a stable condition back to the intensive care unit          [facility]   Date: 07/07/2012  Time: 1:17 PM

## 2012-07-07 NOTE — ED Provider Notes (Signed)
I have personally seen and examined the patient.  I have discussed the plan of care with the resident.  I have reviewed the documentation on PMH/FH/Soc. History.  I have reviewed the documentation of the resident and agree.  Pt ill appearing, she is here with DKA and large abscess to left thigh will require operative repair.  Her vitals remained stable while in the ED.  I checked on patient frequently and had discussions with surgeon and surgery PA.  I also called report to the medicine team (dr Nicoletta Dress)  CRITICAL CARE Performed by: Sharyon Cable   Total critical care time: 35  Critical care time was exclusive of separately billable procedures and treating other patients.  Critical care was necessary to treat or prevent imminent or life-threatening deterioration.  Critical care was time spent personally by me on the following activities: development of treatment plan with patient and/or surrogate as well as nursing, discussions with consultants, evaluation of patient's response to treatment, examination of patient, obtaining history from patient or surrogate, ordering and performing treatments and interventions, ordering and review of laboratory studies, ordering and review of radiographic studies, pulse oximetry and re-evaluation of patient's condition.   Sharyon Cable, MD 07/07/12 [phone]

## 2012-07-07 NOTE — Transfer of Care (Signed)
Immediate Anesthesia Transfer of Care Note  Patient: [name] [name]  Procedure(s) Performed: Procedure(s): IRRIGATION AND DEBRIDEMENT WOUND (Left)  Patient Location: PACU  Anesthesia Type:General  Level of Consciousness: awake, alert , oriented and patient cooperative  Airway & Oxygen Therapy: Patient Spontanous Breathing and Patient connected to face mask oxygen  Post-op Assessment: Report given to PACU RN and Post -op Vital signs reviewed and stable  Post vital signs: Reviewed and stable  Complications: No apparent anesthesia complications

## 2012-07-07 NOTE — Anesthesia Procedure Notes (Signed)
Procedure Name: Intubation Date/Time: 07/07/2012 12:53 PM Performed by: Wanita Chamberlain Pre-anesthesia Checklist: Patient identified, Timeout performed, Emergency Drugs available, Patient being monitored and Suction available Patient Re-evaluated:Patient Re-evaluated prior to inductionOxygen Delivery Method: Circle system utilized Preoxygenation: Pre-oxygenation with 100% oxygen Intubation Type: IV induction Ventilation: Mask ventilation without difficulty Laryngoscope Size: Mac and 3 Grade View: Grade II Tube type: Oral Tube size: 7.5 mm Number of attempts: 1 Airway Equipment and Method: Stylet Placement Confirmation: ETT inserted through vocal cords under direct vision,  positive ETCO2 and breath sounds checked- equal and bilateral Secured at: 22 cm Tube secured with: Tape Dental Injury: Teeth and Oropharynx as per pre-operative assessment

## 2012-07-07 NOTE — Progress Notes (Signed)
PULMONARY  / CRITICAL CARE MEDICINE  Name: [name] [name] MRN: [id number] DOB: June 20, 1975    ADMISSION DATE:  07/06/2012 CONSULTATION DATE:  07/06/2012  REFERRING MD :  Jerene Pitch, IMTS  CHIEF COMPLAINT:  Leg pain  BRIEF PATIENT DESCRIPTION:  37 yo female former smoker  presented with Lt leg pain, chills, sweats from abscess Lt buttock and thigh.  Also had hyperglycemia.  Transferred to ICU after I&D 2/14 and PCCM assumed care per [facility] protocol.  Significant PMHx of DM, HTN, Hyperlipidemia, depression, anxiety  SIGNIFICANT EVENTS: 2/14 I&D Lt buttock/thigh abscess  STUDIES:    LINES / TUBES: PIV  CULTURES: Blood 2/14 >> Wound cx 2/14 >>  MRSA screen 2/14 >> POSITIVE  ANTIBIOTICS: Clindamycin 2/14 >> Vancomycin 2/14 >> Zosyn 2/14 >>  SUBJECTIVE:  Lt leg still very sore.  Denies chest pain, dyspnea.  VITAL SIGNS: Temp:  [98.8 F (37.1 C)-99.7 F (37.6 C)] 99.7 F (37.6 C) (02/15 0358) Pulse Rate:  [110-125] 111 (02/15 1000) Resp:  [14-26] 23 (02/15 1000) BP: (87-152)/(53-87) 96/59 mmHg (02/15 1000) SpO2:  [92 %-100 %] 93 % (02/15 1000) Weight:  [297 lb 2.9 oz (134.8 kg)-309 lb 8.4 oz (140.4 kg)] 309 lb 8.4 oz (140.4 kg) (02/15 0500) INTAKE / OUTPUT: Intake/Output     02/14 0701 - 02/15 0700 02/15 0701 - 02/16 0700   P.O. 1320    I.V. (mL/kg) 2066.7 (14.7) 300 (2.1)   IV Piggyback 700 250   Total Intake(mL/kg) 4086.7 (29.1) 550 (3.9)   Urine (mL/kg/hr) 850 300 (0.5)   Blood 20    Total Output 870 300   Net +3216.7 +250          PHYSICAL EXAMINATION: General:  No distress Neuro:  Normal strength HEENT:  No sinus tenderness Cardiovascular:  s1s1 regular, tachycardic Lungs:  No wheeze Abdomen:  Soft, non tender Musculoskeletal:  Lt inner thigh dressing clean Skin:  No rash  LABS:  Recent Labs Lab 07/06/12 1042 07/06/12 1044 07/06/12 2203 07/06/12 2204 07/07/12 0226 07/07/12 0600  HGB 10.4*  --  8.9*  --   --  8.1*  WBC 10.3  --   11.5*  --   --  10.7*  PLT 278  --  254  --   --  228  NA 133*  --  131*  --  128* 130*  K 4.4  --  4.1  --  3.9 3.9  CL 94*  --  96  --  98 98  CO2 13*  --  19  --  20 19  GLUCOSE 380*  --  333*  --  322* 301*  BUN 13  --  14  --  14 15  CREATININE 0.78  --  0.71  --  0.71 0.84  CALCIUM 9.7  --  9.2  --  8.5 8.9  MG  --   --  1.9  --   --   --   AST  --   --  12  --   --   --   ALT  --   --  13  --   --   --   ALKPHOS  --   --  165*  --   --   --   BILITOT  --   --  0.8  --   --   --   PROT  --   --  7.0  --   --   --   ALBUMIN  --   --  1.7*  --   --   --   LATICACIDVEN  --  2.0  --  1.9  --   --     Recent Labs Lab 07/06/12 1402 07/06/12 1939 07/06/12 2356 07/07/12 0402 07/07/12 0808  GLUCAP 382* 282* 312* 266* 240*    Imaging: Dg Chest Port 1 View  07/06/2012  *RADIOLOGY REPORT*  Clinical Data: Preop for debridement of wound infection, cough and chills  PORTABLE CHEST - 1 VIEW  Comparison: Chest x-ray of 04/11/2011  Findings: No infiltrate or effusion is seen.  The heart is within normal limits in size.  There are degenerative changes throughout the thoracic spine.  IMPRESSION: No active lung disease.   Original Report Authenticated By: Ivar Drape, M.D.      ASSESSMENT / PLAN:  INFECTIOUS A:  Lt buttock/thigh abscess. P:   Continue vancomycin, zosyn, clindamycin pending cx results Post-op care per CCS >> plan for return to OR 2/15  ENDOCRINE A:  DKA. P:   Continue insulin gtt until infection issues more stable  CARDIOVASCULAR A: SIRS/Sepsis w/o septic shock. P:  Monitor hemodynamics Defer CVL placement for now Goal even to positive fluid balance  PULMONARY A: No active issues. P:   Monitor oxygenation  RENAL A:  Anion gap acidosis 2nd to DKA >> resolved. P:   Monitor renal fx, urine outpt, electrolytes  GASTROINTESTINAL A:  Nutrition. P:   NPO pending trip to OR  HEMATOLOGIC A:  Anemia. P:  F/U CBC Transfuse for Hb < 7  NEUROLOGIC A:   Pain control. P:   PRN norco, morphine  Chesley Mires, MD [place] 07/07/2012, 11:18 AM Pager:  [phone] After 3pm call: [phone]

## 2012-07-07 NOTE — Anesthesia Preprocedure Evaluation (Addendum)
Anesthesia Evaluation  Patient identified by MRN, date of birth, ID band Patient awake    Reviewed: Allergy & Precautions, H&P , NPO status , Patient's Chart, lab work & pertinent test results  Airway Mallampati: II TM Distance: >3 FB Neck ROM: full    Dental no notable dental hx. (+) Teeth Intact and Dental Advidsory Given   Pulmonary neg pulmonary ROS,  breath sounds clear to auscultation  Pulmonary exam normal       Cardiovascular hypertension, On Medications and Pt. on medications Rhythm:regular Rate:Normal  ------------------------------------------------------------ History:   PMH:  GERD.  Risk factors:  Current tobacco use. Hypertension. Diabetes mellitus. Dyslipidemia.  ------------------------------------------------------------ Study Conclusions  11/13  - Left ventricle: The cavity size was normal. Wall thickness   was increased in a pattern of mild LVH. Systolic function   was vigorous. The estimated ejection fraction was in the   range of 65% to 70%. Wall motion was normal; there were no   regional wall motion abnormalities. Left ventricular   diastolic function parameters were normal. - Atrial septum: No defect or patent foramen ovale was   identified. - Pulmonary arteries: PA peak pressure: 46mm Hg (S). Transthoracic echocardiography.  M-mode, complete 2D, spectral Doppler, and color Doppler.  Height:  Height: 177.8cm. Height: 70in.  Weight:  Weight: 127.9kg. Weight: 281.4lb.  Body mass index:  BMI: 40.5kg/m^2.  Body surface area:    BSA: 2.56m^2.  Blood pressure:     118/82.  Patient status:  Inpatient.    Neuro/Psych Anxiety Depression  Neuromuscular disease negative neurological ROS  negative psych ROS   GI/Hepatic Neg liver ROS, GERD-  Medicated and Controlled,  Endo/Other  diabetes, Poorly Controlled, Type 2, Oral Hypoglycemic AgentsMorbid obesity  Renal/GU negative Renal ROS  negative  genitourinary   Musculoskeletal   Abdominal (+) + obese,   Peds  Hematology negative hematology ROS (+)   Anesthesia Other Findings   Reproductive/Obstetrics negative OB ROS                         Anesthesia Physical Anesthesia Plan  ASA: III  Anesthesia Plan: General   Post-op Pain Management:    Induction: Intravenous  Airway Management Planned: Oral ETT  Additional Equipment:   Intra-op Plan:   Post-operative Plan: Extubation in OR  Informed Consent: I have reviewed the patients History and Physical, chart, labs and discussed the procedure including the risks, benefits and alternatives for the proposed anesthesia with the patient or authorized representative who has indicated his/her understanding and acceptance.   Dental advisory given  Plan Discussed with: CRNA  Anesthesia Plan Comments:         Anesthesia Quick Evaluation

## 2012-07-07 NOTE — Progress Notes (Signed)
Patient ID: [name] [name], female   DOB: 1976-03-27, 37 y.o.   MRN: [id number] Still with moderate incisional pain POD#1 She is hemodynamically stable Dressings intact WBC decreased, HGB stable  Plan: Return to OR today for wound exploration, possible further debridement if necessary.  I discussed with her in detail.  She agrees to proceed.

## 2012-07-07 NOTE — Anesthesia Postprocedure Evaluation (Signed)
  Anesthesia Post-op Note  Patient: [name] [name]  Procedure(s) Performed: Procedure(s): IRRIGATION AND DEBRIDEMENT WOUND (Left)  Patient Location: PACU  Anesthesia Type:General  Level of Consciousness: awake  Airway and Oxygen Therapy: Patient Spontanous Breathing and Patient connected to face mask oxygen  Post-op Pain: mild  Post-op Assessment: Post-op Vital signs reviewed, Patient's Cardiovascular Status Stable, Respiratory Function Stable, Patent Airway and No signs of Nausea or vomiting  Post-op Vital Signs: Reviewed and stable  Complications: No apparent anesthesia complications

## 2012-07-08 DIAGNOSIS — M79609 Pain in unspecified limb: Secondary | ICD-10-CM

## 2012-07-08 LAB — CBC
HCT: 22.5 % — ABNORMAL LOW (ref 36.0–46.0)
MCHC: 33.3 g/dL (ref 30.0–36.0)
MCV: 76.8 fL — ABNORMAL LOW (ref 78.0–100.0)
Platelets: 217 10*3/uL (ref 150–400)
RDW: 15 % (ref 11.5–15.5)

## 2012-07-08 LAB — VANCOMYCIN, TROUGH: Vancomycin Tr: 29.6 ug/mL (ref 10.0–20.0)

## 2012-07-08 LAB — BASIC METABOLIC PANEL
BUN: 25 mg/dL — ABNORMAL HIGH (ref 6–23)
Calcium: 8.5 mg/dL (ref 8.4–10.5)
Chloride: 97 mEq/L (ref 96–112)
Creatinine, Ser: 2.15 mg/dL — ABNORMAL HIGH (ref 0.50–1.10)
GFR calc Af Amer: 33 mL/min — ABNORMAL LOW (ref >90)

## 2012-07-08 LAB — PHOSPHORUS: Phosphorus: 4.6 mg/dL (ref 2.3–4.6)

## 2012-07-08 LAB — GLUCOSE, CAPILLARY
Glucose-Capillary: 253 mg/dL — ABNORMAL HIGH (ref 70–99)
Glucose-Capillary: 548 mg/dL — ABNORMAL HIGH (ref 70–99)
Glucose-Capillary: 226 mg/dL — ABNORMAL HIGH (ref 70–99)
Glucose-Capillary: 262 mg/dL — ABNORMAL HIGH (ref 70–99)

## 2012-07-08 LAB — SODIUM, URINE, RANDOM: Sodium, Ur: 11 mEq/L

## 2012-07-08 MED ORDER — INSULIN ASPART 100 UNIT/ML ~~LOC~~ SOLN
0.0000 [IU] | Freq: Three times a day (TID) | SUBCUTANEOUS | Status: DC
  Administered 2012-07-08: 20 [IU] via SUBCUTANEOUS
  Administered 2012-07-08: 7 [IU] via SUBCUTANEOUS
  Administered 2012-07-09 – 2012-07-10 (×3): 11 [IU] via SUBCUTANEOUS

## 2012-07-08 MED ORDER — SODIUM CHLORIDE 0.9 % IV BOLUS (SEPSIS)
500.0000 mL | Freq: Once | INTRAVENOUS | Status: AC
  Administered 2012-07-08: 500 mL via INTRAVENOUS

## 2012-07-08 MED ORDER — SODIUM CHLORIDE 0.9 % IV SOLN
INTRAVENOUS | Status: DC
  Administered 2012-07-08 – 2012-07-09 (×2): via INTRAVENOUS
  Administered 2012-07-10: 100 mL/h via INTRAVENOUS
  Administered 2012-07-13 – 2012-07-16 (×3): via INTRAVENOUS
  Administered 2012-07-17: 100 mL/h via INTRAVENOUS
  Administered 2012-07-18: 12:00:00 via INTRAVENOUS

## 2012-07-08 MED ORDER — VANCOMYCIN HCL 10 G IV SOLR
2000.0000 mg | INTRAVENOUS | Status: DC
  Administered 2012-07-09: 2000 mg via INTRAVENOUS
  Filled 2012-07-08 (×2): qty 2000

## 2012-07-08 MED ORDER — SODIUM CHLORIDE 0.9 % IV SOLN
250.0000 mL | INTRAVENOUS | Status: DC | PRN
  Administered 2012-07-08: 250 mL via INTRAVENOUS

## 2012-07-08 MED ORDER — SODIUM CHLORIDE 0.9 % IV BOLUS (SEPSIS)
1000.0000 mL | Freq: Once | INTRAVENOUS | Status: AC
  Administered 2012-07-08: 1000 mL via INTRAVENOUS

## 2012-07-08 MED ORDER — ENOXAPARIN SODIUM 40 MG/0.4ML ~~LOC~~ SOLN
40.0000 mg | SUBCUTANEOUS | Status: DC
  Administered 2012-07-08 – 2012-07-13 (×5): 40 mg via SUBCUTANEOUS
  Filled 2012-07-08 (×6): qty 0.4

## 2012-07-08 MED ORDER — INSULIN ASPART 100 UNIT/ML ~~LOC~~ SOLN
0.0000 [IU] | Freq: Every day | SUBCUTANEOUS | Status: DC
  Administered 2012-07-08: 3 [IU] via SUBCUTANEOUS
  Administered 2012-07-09: 4 [IU] via SUBCUTANEOUS
  Administered 2012-07-11: 3 [IU] via SUBCUTANEOUS

## 2012-07-08 MED ORDER — SODIUM CHLORIDE 0.9 % IV SOLN: 250.0000 mL | INTRAVENOUS | Status: DC | PRN

## 2012-07-08 MED ORDER — INSULIN ASPART 100 UNIT/ML ~~LOC~~ SOLN
6.0000 [IU] | Freq: Three times a day (TID) | SUBCUTANEOUS | Status: DC
  Administered 2012-07-08 – 2012-07-19 (×22): 6 [IU] via SUBCUTANEOUS
  Administered 2012-07-20: 13:00:00 via SUBCUTANEOUS
  Administered 2012-07-21 – 2012-07-23 (×6): 6 [IU] via SUBCUTANEOUS

## 2012-07-08 MED ORDER — INSULIN GLARGINE 100 UNIT/ML ~~LOC~~ SOLN
20.0000 [IU] | Freq: Every day | SUBCUTANEOUS | Status: DC
  Administered 2012-07-08: 20 [IU] via SUBCUTANEOUS

## 2012-07-08 NOTE — Clinical Social Work Psych Assess (Signed)
CSW received consult regarding patient questions about Medicaid. Completed assessment and placed in patient's shadow chart. Provided resource for Medicaid questions. CSW signing off.  Patrick Jupiter, [place] (weekend)  .

## 2012-07-08 NOTE — Progress Notes (Signed)
eLink Physician-Brief Progress Note Patient Name: SEMAJA ERNO DOB: [birth year] MRN: [id number]  Date of Service  07/08/2012   HPI/Events of Note   Hyperglycemic Hyponatremic   eICU Interventions  D/c d5 1/2 ns Bolus saline x 500cc Change maintenance to NS   Intervention Category Intermediate Interventions: Electrolyte abnormality - evaluation and management  MCQUAID, DOUGLAS 07/08/2012, 1:56 AM

## 2012-07-08 NOTE — Progress Notes (Signed)
VASCULAR LAB PRELIMINARY  PRELIMINARY  PRELIMINARY  PRELIMINARY  Bilateral lower extremity venous Dopplers completed.    Preliminary report:  There is no obvious evidence of DVT or SVT noted in the bilateral lower extremities.  Evart Mcdonnell, RVT 07/08/2012, 4:05 PM

## 2012-07-08 NOTE — Progress Notes (Signed)
ANTIBIOTIC CONSULT NOTE - FOLLOW UP  Pharmacy Consult for vancomycin Indication: abscess/gangrene  Labs:  Recent Labs  07/06/12 2203  07/07/12 0600 07/07/12 1015 07/08/12 0450 07/08/12 1059  WBC 11.5*  --  10.7*  --  11.5*  --   HGB 8.9*  --  8.1*  --  7.5*  --   PLT 254  --  228  --  217  --   LABCREA  --   --   --   --   --  138.03  CREATININE 0.71  < > 0.84 1.01 2.15*  --   < > = values in this interval not displayed. Estimated Creatinine Clearance: 55.6 ml/min (by C-G formula based on Cr of 2.15).  Recent Labs  07/08/12 2123  [place] 29.6*     Microbiology: Recent Results (from the past 720 hour(s))  CULTURE, BLOOD (ROUTINE X 2)     Status: None   Collection Time    07/06/12 10:50 AM      Result Value Range Status   Specimen Description BLOOD LEFT ANTECUBITAL   Final   Special Requests BOTTLES DRAWN AEROBIC ONLY 10CC   Final   Culture  Setup Time 07/06/2012 18:19   Final   Culture     Final   Value:        BLOOD CULTURE RECEIVED NO GROWTH TO DATE CULTURE WILL BE HELD FOR 5 DAYS BEFORE ISSUING A FINAL NEGATIVE REPORT   Report Status PENDING   Incomplete  CULTURE, BLOOD (ROUTINE X 2)     Status: None   Collection Time    07/06/12 11:00 AM      Result Value Range Status   Specimen Description BLOOD RIGHT ANTECUBITAL   Final   Special Requests BOTTLES DRAWN AEROBIC ONLY 2CC   Final   Culture  Setup Time 07/06/2012 18:19   Final   Culture     Final   Value:        BLOOD CULTURE RECEIVED NO GROWTH TO DATE CULTURE WILL BE HELD FOR 5 DAYS BEFORE ISSUING A FINAL NEGATIVE REPORT   Report Status PENDING   Incomplete  WOUND CULTURE     Status: None   Collection Time    07/06/12  3:11 PM      Result Value Range Status   Specimen Description WOUND THIGH LEFT   Final   Special Requests PT ON ZOSYN,VANCOMYCIN AND CLEOCIN   Final   Gram Stain     Final   Value: RARE WBC PRESENT, PREDOMINANTLY PMN     NO SQUAMOUS EPITHELIAL CELLS SEEN     ABUNDANT GRAM POSITIVE RODS      FEW GRAM POSITIVE COCCI     IN PAIRS IN CLUSTERS   Culture Culture reincubated for better growth   Final   Report Status PENDING   Incomplete  ANAEROBIC CULTURE     Status: None   Collection Time    07/06/12  3:11 PM      Result Value Range Status   Specimen Description WOUND THIGH LEFT   Final   Special Requests PT ON ZOSYN,VANCOMYCIN AND CLEOCIN   Final   Gram Stain PENDING   Incomplete   Culture     Final   Value: NO ANAEROBES ISOLATED; CULTURE IN PROGRESS FOR 5 DAYS   Report Status PENDING   Incomplete  MRSA PCR SCREENING     Status: Abnormal   Collection Time    07/06/12  8:12 PM  Result Value Range Status   MRSA by PCR POSITIVE (*) NEGATIVE Final   Comment:            The GeneXpert MRSA Assay (FDA     approved for NASAL specimens     only), is one component of a     comprehensive MRSA colonization     surveillance program. It is not     intended to diagnose MRSA     infection nor to guide or     monitor treatment for     MRSA infections.     RESULT CALLED TO, READ BACK BY AND VERIFIED WITH:     S COGGINS RN 2.14.14 AT 2215 BY ROMEROJ    Assessment: 37yo female supratherapeutic on vancomycin with initial dosing for abscess and gangrene; SCr has more than doubled since admission.  Goal of Therapy:  Vancomycin trough level 15-20 mcg/ml  Plan:  Approximately half of 2200 dose had run prior to lab result, turned off when reported; will change vancomycin to 2000mg  IV Q24H for calculated trough of 17.5 and continue to monitor CrCl and levels.  Rogue Bussing PharmD BCPS 07/08/2012,11:09 PM

## 2012-07-08 NOTE — Progress Notes (Signed)
PULMONARY  / CRITICAL CARE MEDICINE  Name: [name] [name] MRN: [id number] DOB: 1976/01/04    ADMISSION DATE:  07/06/2012 CONSULTATION DATE:  07/06/2012  REFERRING MD :  Jerene Pitch, IMTS  CHIEF COMPLAINT:  Leg pain  BRIEF PATIENT DESCRIPTION:  37 yo female former smoker  presented with Lt leg pain, chills, sweats from abscess Lt buttock and thigh.  Also had hyperglycemia.  Transferred to ICU after I&D 2/14 and PCCM assumed care per [facility] protocol.  Significant PMHx of DM, HTN, Hyperlipidemia, depression, anxiety  SIGNIFICANT EVENTS: 2/14 I&D Lt buttock/thigh abscess 2/15 I&D Lt buttock/thigh abscess  STUDIES:    LINES / TUBES: PIV  CULTURES: Blood 2/14 >> Wound cx 2/14 >>  MRSA screen 2/14 >> POSITIVE  ANTIBIOTICS: Clindamycin 2/14 >> Vancomycin 2/14 >> Zosyn 2/14 >>  SUBJECTIVE:  Still has soreness in leg.  Off insulin gtt.  Denies chest pain.  Breathing okay.  Tolerating diet.  VITAL SIGNS: Temp:  [98.4 F (36.9 C)-100.2 F (37.9 C)] 99.3 F (37.4 C) (02/16 0400) Pulse Rate:  [99-119] 103 (02/16 0900) Resp:  [17-25] 18 (02/16 0900) BP: (87-132)/(53-78) 107/63 mmHg (02/16 0800) SpO2:  [91 %-100 %] 96 % (02/16 0900) Weight:  [309 lb 11.9 oz (140.5 kg)] 309 lb 11.9 oz (140.5 kg) (02/16 0500) INTAKE / OUTPUT: Intake/Output     02/15 0701 - 02/16 0700 02/16 0701 - 02/17 0700   P.O. 320    I.V. (mL/kg) 2078.8 (14.8)    IV Piggyback 1275 525   Total Intake(mL/kg) 3673.8 (26.1) 525 (3.7)   Urine (mL/kg/hr) 1310 (0.4) 90 (0.2)   Blood     Total Output 1310 90   Net +2363.8 +435          PHYSICAL EXAMINATION: General:  No distress Neuro:  Normal strength HEENT:  No sinus tenderness Cardiovascular:  s1s1 regular, tachycardic Lungs:  No wheeze Abdomen:  Soft, non tender Musculoskeletal:  Lt inner thigh dressing clean Skin:  No rash  LABS:  Recent Labs Lab 07/06/12 1042 07/06/12 1044 07/06/12 2203 07/06/12 2204  07/07/12 0600  07/07/12 1015 07/08/12 0450  HGB 10.4*  --  8.9*  --   --  8.1*  --  7.5*  WBC 10.3  --  11.5*  --   --  10.7*  --  11.5*  PLT 278  --  254  --   --  228  --  217  NA 133*  --  131*  --   < > 130* 129* 126*  K 4.4  --  4.1  --   < > 3.9 4.0 4.2  CL 94*  --  96  --   < > 98 99 97  CO2 13*  --  19  --   < > 19 20 19   GLUCOSE 380*  --  333*  --   < > 301* 257* 319*  BUN 13  --  14  --   < > 15 17 25*  CREATININE 0.78  --  0.71  --   < > 0.84 1.01 2.15*  CALCIUM 9.7  --  9.2  --   < > 8.9 8.8 8.5  MG  --   --  1.9  --   --   --   --  2.2  PHOS  --   --   --   --   --   --   --  4.6  AST  --   --  12  --   --   --   --   --  ALT  --   --  13  --   --   --   --   --   ALKPHOS  --   --  165*  --   --   --   --   --   BILITOT  --   --  0.8  --   --   --   --   --   PROT  --   --  7.0  --   --   --   --   --   ALBUMIN  --   --  1.7*  --   --   --   --   --   LATICACIDVEN  --  2.0  --  1.9  --   --   --   --   < > = values in this interval not displayed.  Recent Labs Lab 07/07/12 1606 07/07/12 1942 07/07/12 2335 07/08/12 0408 07/08/12 0853  GLUCAP 226* 311* 314* 269* 253*    Imaging: Dg Chest Port 1 View  07/06/2012  *RADIOLOGY REPORT*  Clinical Data: Preop for debridement of wound infection, cough and chills  PORTABLE CHEST - 1 VIEW  Comparison: Chest x-ray of 04/11/2011  Findings: No infiltrate or effusion is seen.  The heart is within normal limits in size.  There are degenerative changes throughout the thoracic spine.  IMPRESSION: No active lung disease.   Original Report Authenticated By: Ivar Drape, M.D.      ASSESSMENT / PLAN:  INFECTIOUS A:  Lt buttock/thigh abscess. P:   Continue vancomycin, zosyn, clindamycin pending cx results Post-op care per CCS Check doppler legs to r/o DVT  ENDOCRINE A:  DM type II with hyperglycemia P:   SSI Add lantus Hold outpt glipizide, metformin  RENAL A:  Acute renal failure. P:   Monitor renal fx, urine outpt,  electrolytes  CARDIOVASCULAR A: SIRS/Sepsis w/o septic shock. P:  Monitor hemodynamics Defer CVL placement for now Goal even to positive fluid balance  PULMONARY A: No active issues. P:   Monitor oxygenation  GASTROINTESTINAL A:  Nutrition. P:   Carb modified diet  HEMATOLOGIC A:  Anemia. P:  F/U CBC Transfuse for Hb < 7  NEUROLOGIC A:  Pain control. Hx of Depression. P:   PRN norco, morphine Continue elavil, prozac  Resolved problems >> DKA, anion gap acidosis  Chesley Mires, MD [place] 07/08/2012, 10:06 AM Pager:  [phone] After 3pm call: [phone]

## 2012-07-08 NOTE — Progress Notes (Signed)
General Surgery Note  LOS: 2 days  POD -   1 Day Post-Op Room - 3108  Assessment/Plan: 1.  Left thigh/perineal necrosis/abscess   Debrided by D. Jamespaul Secrist - 07/06/2012 and by D. Ninfa Linden - 07/07/2012  On Cleocin/Zosyn/Vanc  Doing okay.  Will not change dressing today.  Dr. Ninfa Linden will decide tomorrow AM if she will return to OR or to change dressing at bedside.  I will keep her NPO in anticipation of surgery.  2. Poorly controlled diabetes.  HgbA1C - 15.4 - 07/07/2012  Glucose - 319 - 07/08/2012 3.  Anemia  Hgb - 7.5 - 07/08/2012  Partially blood loss and partially chronic disease.  Follow labs. 4.  Increasing creatinine  Creat. - 2.15 - 07/08/2012  Possibly behind in volume - will bolus with NS 5.  Morbid obesity  6.  DVT prophylaxis - despite blood loss from surgery  Patient at high risk for DVT - will start Lovenox  Subjective:  Has whispered voice.  Has left thigh pain.  No other specific complaint. Objective:   Filed Vitals:   07/08/12 0700  BP: 110/64  Pulse: 101  Temp:   Resp: 20     Intake/Output from previous day:  02/15 0701 - 02/16 0700 In: 3732.5 [P.O.:320; I.V.:2137.5; IV Piggyback:1275] Out: 1310 [Urine:1310]  Intake/Output this shift:      Physical Exam:   General: Obese AA F who is alert and oriented.    HEENT: Normal. Pupils equal. .   Abdomen: Soft   Wound: Dressing intact.  No staining.     Lab Results:    Recent Labs  07/07/12 0600 07/08/12 0450  WBC 10.7* 11.5*  HGB 8.1* 7.5*  HCT 23.7* 22.5*  PLT 228 217    BMET   Recent Labs  07/07/12 1015 07/08/12 0450  NA 129* 126*  K 4.0 4.2  CL 99 97  CO2 20 19  GLUCOSE 257* 319*  BUN 17 25*  CREATININE 1.01 2.15*  CALCIUM 8.8 8.5    PT/INR  No results found for this basename: LABPROT, INR,  in the last 72 hours  ABG  No results found for this basename: PHART, PCO2, PO2, HCO3,  in the last 72 hours   Studies/Results:  Dg Chest Port 1 View  07/06/2012  *RADIOLOGY REPORT*  Clinical  Data: Preop for debridement of wound infection, cough and chills  PORTABLE CHEST - 1 VIEW  Comparison: Chest x-ray of 04/11/2011  Findings: No infiltrate or effusion is seen.  The heart is within normal limits in size.  There are degenerative changes throughout the thoracic spine.  IMPRESSION: No active lung disease.   Original Report Authenticated By: Ivar Drape, M.D.      Anti-infectives:   Anti-infectives   Start     Dose/Rate Route Frequency Ordered Stop   07/07/12 1000  vancomycin (VANCOCIN) 1,250 mg in sodium chloride 0.9 % 250 mL IVPB     1,250 mg 166.7 mL/hr over 90 Minutes Intravenous Every 12 hours 07/06/12 2110     07/07/12 0030  clindamycin (CLEOCIN) IVPB 600 mg     600 mg 100 mL/hr over 30 Minutes Intravenous 3 times per day 07/07/12 0019     07/06/12 2200  linezolid (ZYVOX) IVPB 600 mg  Status:  Discontinued     600 mg 300 mL/hr over 60 Minutes Intravenous Every 12 hours 07/06/12 1612 07/06/12 2050   07/06/12 2200  piperacillin-tazobactam (ZOSYN) IVPB 3.375 g     3.375 g 12.5 mL/hr over 240  Minutes Intravenous 3 times per day 07/06/12 1646     07/06/12 2115  vancomycin (VANCOCIN) 2,000 mg in sodium chloride 0.9 % 500 mL IVPB     2,000 mg 250 mL/hr over 120 Minutes Intravenous  Once 07/06/12 2110 07/07/12 0042   07/06/12 2100  vancomycin (VANCOCIN) IVPB 1000 mg/200 mL premix  Status:  Discontinued     1,000 mg 200 mL/hr over 60 Minutes Intravenous  Once 07/06/12 2050 07/06/12 2109   07/06/12 1830  piperacillin-tazobactam (ZOSYN) IVPB 3.375 g  Status:  Discontinued     3.375 g 100 mL/hr over 30 Minutes Intravenous 4 times per day 07/06/12 1825 07/06/12 1858   07/06/12 1830  vancomycin (VANCOCIN) IVPB 1000 mg/200 mL premix  Status:  Discontinued     1,000 mg 200 mL/hr over 60 Minutes Intravenous Every 12 hours 07/06/12 1825 07/06/12 1920   07/06/12 1615  linezolid (ZYVOX) IVPB 600 mg  Status:  Discontinued     600 mg 300 mL/hr over 60 Minutes Intravenous Every 12 hours  07/06/12 1610 07/06/12 1619   07/06/12 1300  piperacillin-tazobactam (ZOSYN) IVPB 3.375 g     3.375 g 100 mL/hr over 30 Minutes Intravenous  Once 07/06/12 1250 07/06/12 1450   07/06/12 1300  clindamycin (CLEOCIN) IVPB 600 mg     600 mg 100 mL/hr over 30 Minutes Intravenous  Once 07/06/12 1253 07/06/12 1352   07/06/12 1130  vancomycin (VANCOCIN) IVPB 1000 mg/200 mL premix     1,000 mg 200 mL/hr over 60 Minutes Intravenous  Once 07/06/12 1117 07/06/12 1230      Alphonsa Overall, MD, [place] Pager: [phone],   [place] [facility] Office: [phone] 07/08/2012

## 2012-07-08 NOTE — Progress Notes (Signed)
CRITICAL VALUE ALERT  Critical value received:  Vanc. Level: 29.6  Date of notification:  07/08/12  Time of notification:  2245  Critical value read back: yes  Nurse who received alert:  Viann Fish, RN  MD notified (1st page):  ELINK  Time of first page:  2250

## 2012-07-09 ENCOUNTER — Encounter (HOSPITAL_COMMUNITY): Payer: Self-pay | Admitting: Anesthesiology

## 2012-07-09 ENCOUNTER — Encounter (HOSPITAL_COMMUNITY)
Admission: EM | Disposition: A | Discharge status: Nursing Home (Includes ICF) | Payer: Self-pay | Source: Home / Self Care | Attending: Internal Medicine

## 2012-07-09 ENCOUNTER — Inpatient Hospital Stay (HOSPITAL_COMMUNITY): Payer: Self-pay | Admitting: Anesthesiology

## 2012-07-09 HISTORY — PX: INCISION AND DRAINAGE ABSCESS: SHX5864

## 2012-07-09 LAB — CBC WITH DIFFERENTIAL/PLATELET
Basophils Relative: 0 % (ref 0–1)
Eosinophils Absolute: 0.1 10*3/uL (ref 0.0–0.7)
Eosinophils Relative: 1 % (ref 0–5)
HCT: 21.6 % — ABNORMAL LOW (ref 36.0–46.0)
Hemoglobin: 7.1 g/dL — ABNORMAL LOW (ref 12.0–15.0)
Lymphocytes Relative: 16 % (ref 12–46)
Monocytes Relative: 10 % (ref 3–12)
Neutrophils Relative %: 73 % (ref 43–77)
RBC: 2.79 MIL/uL — ABNORMAL LOW (ref 3.87–5.11)

## 2012-07-09 LAB — BASIC METABOLIC PANEL
Chloride: 100 mEq/L (ref 96–112)
GFR calc Af Amer: 30 mL/min — ABNORMAL LOW (ref >90)
GFR calc non Af Amer: 26 mL/min — ABNORMAL LOW (ref >90)
Potassium: 4.1 mEq/L (ref 3.5–5.1)
Sodium: 130 mEq/L — ABNORMAL LOW (ref 135–145)

## 2012-07-09 LAB — URINALYSIS, ROUTINE W REFLEX MICROSCOPIC
Bilirubin Urine: NEGATIVE
Nitrite: NEGATIVE
Specific Gravity, Urine: 1.017 (ref 1.005–1.030)
Urobilinogen, UA: 1 mg/dL (ref 0.0–1.0)

## 2012-07-09 LAB — GLUCOSE, CAPILLARY
Glucose-Capillary: 275 mg/dL — ABNORMAL HIGH (ref 70–99)
Glucose-Capillary: 310 mg/dL — ABNORMAL HIGH (ref 70–99)

## 2012-07-09 LAB — URINE MICROSCOPIC-ADD ON

## 2012-07-09 SURGERY — INCISION AND DRAINAGE, ABSCESS
Anesthesia: General | Site: Thigh | Laterality: Left | Wound class: Dirty or Infected

## 2012-07-09 MED ORDER — PROMETHAZINE HCL 25 MG/ML IJ SOLN: 6.2500 mg | INTRAMUSCULAR | Status: DC | PRN

## 2012-07-09 MED ORDER — LIDOCAINE HCL (CARDIAC) 20 MG/ML IV SOLN
INTRAVENOUS | Status: DC | PRN
  Administered 2012-07-09: 100 mg via INTRAVENOUS

## 2012-07-09 MED ORDER — LACTATED RINGERS IV SOLN: INTRAVENOUS | Status: DC

## 2012-07-09 MED ORDER — PROPOFOL 10 MG/ML IV BOLUS
INTRAVENOUS | Status: DC | PRN
  Administered 2012-07-09: 200 mg via INTRAVENOUS

## 2012-07-09 MED ORDER — GLYCOPYRROLATE 0.2 MG/ML IJ SOLN
INTRAMUSCULAR | Status: DC | PRN
  Administered 2012-07-09: .2 mg via INTRAVENOUS
  Administered 2012-07-09: .8 mg via INTRAVENOUS

## 2012-07-09 MED ORDER — MEPERIDINE HCL 25 MG/ML IJ SOLN: 6.2500 mg | INTRAMUSCULAR | Status: DC | PRN

## 2012-07-09 MED ORDER — INSULIN REGULAR HUMAN 100 UNIT/ML IJ SOLN
INTRAMUSCULAR | Status: DC | PRN
  Administered 2012-07-09: 3 [IU] via INTRAVENOUS

## 2012-07-09 MED ORDER — ESMOLOL HCL 10 MG/ML IV SOLN
INTRAVENOUS | Status: DC | PRN
  Administered 2012-07-09 (×3): 30 mg via INTRAVENOUS

## 2012-07-09 MED ORDER — NEOSTIGMINE METHYLSULFATE 1 MG/ML IJ SOLN
INTRAMUSCULAR | Status: DC | PRN
  Administered 2012-07-09: 5 mg via INTRAVENOUS
  Administered 2012-07-09: 2 mg via INTRAVENOUS

## 2012-07-09 MED ORDER — SUCCINYLCHOLINE CHLORIDE 20 MG/ML IJ SOLN
INTRAMUSCULAR | Status: DC | PRN
  Administered 2012-07-09: 100 mg via INTRAVENOUS

## 2012-07-09 MED ORDER — MUPIROCIN 2 % EX OINT
1.0000 | TOPICAL_OINTMENT | Freq: Two times a day (BID) | CUTANEOUS | Status: AC
Start: 2012-07-09 — End: 2012-07-14
  Administered 2012-07-09 – 2012-07-14 (×10): 1 via NASAL
  Filled 2012-07-09: qty 22

## 2012-07-09 MED ORDER — ROCURONIUM BROMIDE 100 MG/10ML IV SOLN
INTRAVENOUS | Status: DC | PRN
  Administered 2012-07-09: 30 mg via INTRAVENOUS

## 2012-07-09 MED ORDER — 0.9 % SODIUM CHLORIDE (POUR BTL) OPTIME
TOPICAL | Status: DC | PRN
  Administered 2012-07-09: 1000 mL

## 2012-07-09 MED ORDER — ARTIFICIAL TEARS OP OINT
TOPICAL_OINTMENT | OPHTHALMIC | Status: DC | PRN
  Administered 2012-07-09: 1 via OPHTHALMIC

## 2012-07-09 MED ORDER — INSULIN GLARGINE 100 UNIT/ML ~~LOC~~ SOLN
30.0000 [IU] | Freq: Every day | SUBCUTANEOUS | Status: DC
  Administered 2012-07-09 – 2012-07-10 (×2): via SUBCUTANEOUS
  Administered 2012-07-11: 30 [IU] via SUBCUTANEOUS

## 2012-07-09 MED ORDER — SODIUM CHLORIDE 0.9 % IV SOLN
INTRAVENOUS | Status: DC | PRN
  Administered 2012-07-09: 10:00:00 via INTRAVENOUS

## 2012-07-09 MED ORDER — ONDANSETRON HCL 4 MG/2ML IJ SOLN
INTRAMUSCULAR | Status: DC | PRN
  Administered 2012-07-09: 4 mg via INTRAVENOUS

## 2012-07-09 MED ORDER — CHLORHEXIDINE GLUCONATE CLOTH 2 % EX PADS
6.0000 | MEDICATED_PAD | Freq: Every day | CUTANEOUS | Status: AC
  Administered 2012-07-10 – 2012-07-14 (×5): 6 via TOPICAL

## 2012-07-09 MED ORDER — MIDAZOLAM HCL 5 MG/5ML IJ SOLN
INTRAMUSCULAR | Status: DC | PRN
  Administered 2012-07-09: 1 mg via INTRAVENOUS

## 2012-07-09 MED ORDER — FENTANYL CITRATE 0.05 MG/ML IJ SOLN: 25.0000 ug | INTRAMUSCULAR | Status: DC | PRN

## 2012-07-09 MED ORDER — FENTANYL CITRATE 0.05 MG/ML IJ SOLN
INTRAMUSCULAR | Status: DC | PRN
  Administered 2012-07-09: 50 ug via INTRAVENOUS
  Administered 2012-07-09: 100 ug via INTRAVENOUS
  Administered 2012-07-09 (×2): 50 ug via INTRAVENOUS

## 2012-07-09 SURGICAL SUPPLY — 30 items
BANDAGE GAUZE ELAST BULKY 4 IN (GAUZE/BANDAGES/DRESSINGS) ×1 IMPLANT
CANISTER SUCTION 2500CC (MISCELLANEOUS) ×2 IMPLANT
CLOTH BEACON ORANGE TIMEOUT ST (SAFETY) ×2 IMPLANT
COVER MAYO STAND STRL (DRAPES) ×1 IMPLANT
COVER SURGICAL LIGHT HANDLE (MISCELLANEOUS) ×2 IMPLANT
DRAPE LAPAROSCOPIC ABDOMINAL (DRAPES) ×2 IMPLANT
DRAPE UTILITY 15X26 W/TAPE STR (DRAPE) ×2 IMPLANT
DRSG PAD ABDOMINAL 8X10 ST (GAUZE/BANDAGES/DRESSINGS) ×4 IMPLANT
ELECT CAUTERY BLADE 6.4 (BLADE) ×2 IMPLANT
ELECT REM PT RETURN 9FT ADLT (ELECTROSURGICAL) ×2
ELECTRODE REM PT RTRN 9FT ADLT (ELECTROSURGICAL) ×1 IMPLANT
GLOVE BIO SURGEON STRL SZ 6.5 (GLOVE) ×1 IMPLANT
GLOVE BIOGEL PI IND STRL 7.0 (GLOVE) IMPLANT
GLOVE BIOGEL PI INDICATOR 7.0 (GLOVE) ×1
GLOVE SURG SIGNA 7.5 PF LTX (GLOVE) ×2 IMPLANT
GLOVE SURG SS PI 7.0 STRL IVOR (GLOVE) ×1 IMPLANT
GOWN PREVENTION PLUS XLARGE (GOWN DISPOSABLE) ×2 IMPLANT
GOWN STRL NON-REIN LRG LVL3 (GOWN DISPOSABLE) ×2 IMPLANT
KIT BASIN OR (CUSTOM PROCEDURE TRAY) ×2 IMPLANT
KIT ROOM TURNOVER OR (KITS) ×2 IMPLANT
LEGGING LITHOTOMY PAIR STRL (DRAPES) ×2 IMPLANT
NS IRRIG 1000ML POUR BTL (IV SOLUTION) ×2 IMPLANT
PACK GENERAL/GYN (CUSTOM PROCEDURE TRAY) ×2 IMPLANT
PAD ARMBOARD 7.5X6 YLW CONV (MISCELLANEOUS) ×3 IMPLANT
SPECIMEN JAR SMALL (MISCELLANEOUS) IMPLANT
SPONGE GAUZE 4X4 12PLY (GAUZE/BANDAGES/DRESSINGS) IMPLANT
SWAB COLLECTION DEVICE MRSA (MISCELLANEOUS) IMPLANT
TOWEL OR 17X24 6PK STRL BLUE (TOWEL DISPOSABLE) ×1 IMPLANT
TOWEL OR 17X26 10 PK STRL BLUE (TOWEL DISPOSABLE) ×2 IMPLANT
TUBE ANAEROBIC SPECIMEN COL (MISCELLANEOUS) IMPLANT

## 2012-07-09 NOTE — Progress Notes (Signed)
Patient ID: [name] [name], female   DOB: 1975-09-16, 37 y.o.   MRN: [id number]  Patient hemodynamically stable.  Anemic.  On multiple antibiotics  Needs return to the OR today to re-assess wound, change dressing, and see if any further debridement needed.  Risks again discussed with her in detail.  She agrees to proceed.

## 2012-07-09 NOTE — Preoperative (Signed)
Beta Blockers   Reason not to administer Beta Blockers:Not Applicable 

## 2012-07-09 NOTE — Progress Notes (Signed)
PULMONARY  / CRITICAL CARE MEDICINE  Name: [name] [name] MRN: [id number] DOB: [birth year]    ADMISSION DATE:  07/06/2012 CONSULTATION DATE:  07/06/2012  REFERRING MD :  Jerene Pitch, IMTS  CHIEF COMPLAINT:  Leg pain  BRIEF PATIENT DESCRIPTION:  37 yo female former smoker  presented with Lt leg pain, chills, sweats from abscess Lt buttock and thigh.  Also had hyperglycemia.  Transferred to ICU after I&D 2/14 and PCCM assumed care per [facility] protocol.  Significant PMHx of DM, HTN, Hyperlipidemia, depression, anxiety  SIGNIFICANT EVENTS: 2/14 I&D Lt buttock/thigh abscess 2/15 I&D Lt buttock/thigh abscess 2/17 I&D Lt buttock/thigh abscess  STUDIES:  2/16 Doppler legs >> negative for DVT  LINES / TUBES: PIV  CULTURES: Blood 2/14 >> Wound cx 2/14 >> Staph aureus MRSA screen 2/14 >> POSITIVE  ANTIBIOTICS: Clindamycin 2/14 >> Vancomycin 2/14 >> Zosyn 2/14 >>  SUBJECTIVE:  Leg pain improved.  Denies chest pain, nausea, dyspnea.  VITAL SIGNS: Temp:  [97.5 F (36.4 C)-99.5 F (37.5 C)] 97.9 F (36.6 C) (02/17 1330) Pulse Rate:  [99-110] 100 (02/17 1347) Resp:  [9-26] 22 (02/17 1347) BP: (110-139)/(56-80) 110/74 mmHg (02/17 1341) SpO2:  [95 %-100 %] 99 % (02/17 1347) Weight:  [318 lb 9 oz (144.5 kg)] 318 lb 9 oz (144.5 kg) (02/17 0500) INTAKE / OUTPUT: Intake/Output     02/16 0701 - 02/17 0700 02/17 0701 - 02/18 0700   P.O. 930    I.V. (mL/kg) 1350 (9.3) 1125 (7.8)   IV Piggyback 1800 25   Total Intake(mL/kg) 4080 (28.2) 1150 (8)   Urine (mL/kg/hr) 1235 (0.4) 535 (0.5)   Total Output 1235 535   Net +2845 +615          PHYSICAL EXAMINATION: General:  No distress Neuro:  Normal strength HEENT:  No sinus tenderness Cardiovascular:  s1s1 regular, tachycardic Lungs:  No wheeze Abdomen:  Soft, non tender Musculoskeletal:  Lt inner thigh dressing clean Skin:  No rash  LABS:  Recent Labs Lab 07/06/12 1042 07/06/12 1044 07/06/12 2203 07/06/12 2204   07/07/12 0600 07/07/12 1015 07/08/12 0450 07/08/12 1333 07/09/12 0440  HGB 10.4*  --  8.9*  --   --  8.1*  --  7.5*  --  7.1*  WBC 10.3  --  11.5*  --   --  10.7*  --  11.5*  --  11.3*  PLT 278  --  254  --   --  228  --  217  --  200  NA 133*  --  131*  --   < > 130* 129* 126*  --  130*  K 4.4  --  4.1  --   < > 3.9 4.0 4.2  --  4.1  CL 94*  --  96  --   < > 98 99 97  --  100  CO2 13*  --  19  --   < > 19 20 19   --  17*  GLUCOSE 380*  --  333*  --   < > 301* 257* 319* 356* 310*  BUN 13  --  14  --   < > 15 17 25*  --  29*  CREATININE 0.78  --  0.71  --   < > 0.84 1.01 2.15*  --  2.29*  CALCIUM 9.7  --  9.2  --   < > 8.9 8.8 8.5  --  8.5  MG  --   --  1.9  --   --   --   --  2.2  --   --   PHOS  --   --   --   --   --   --   --  4.6  --   --   AST  --   --  12  --   --   --   --   --   --   --   ALT  --   --  13  --   --   --   --   --   --   --   ALKPHOS  --   --  165*  --   --   --   --   --   --   --   BILITOT  --   --  0.8  --   --   --   --   --   --   --   PROT  --   --  7.0  --   --   --   --   --   --   --   ALBUMIN  --   --  1.7*  --   --   --   --   --   --   --   LATICACIDVEN  --  2.0  --  1.9  --   --   --   --   --   --   < > = values in this interval not displayed.  Recent Labs Lab 07/08/12 1219 07/08/12 1703 07/08/12 2201 07/09/12 0807 07/09/12 1208  GLUCAP 548* 226* 262* 297* 252*    Imaging: No results found.   ASSESSMENT / PLAN:  INFECTIOUS A:  Lt buttock/thigh abscess. P:   Continue vancomycin, zosyn, clindamycin pending cx results Post-op care per [facility]  ENDOCRINE A:  DM type II with hyperglycemia. Elevated CBG 2/16 after family brought in food. P:   SSI Add lantus Hold outpt glipizide, metformin Explained to pt not to eat outside food for now  RENAL A:  Acute renal failure. Making urine. P:   Monitor renal fx, urine outpt, electrolytes  CARDIOVASCULAR A: SIRS/Sepsis w/o septic shock. P:  Monitor hemodynamics Goal even fluid  balance  PULMONARY A: No active issues. P:   Monitor oxygenation  GASTROINTESTINAL A:  Nutrition. P:   Clear liquid diet until done with OR trips  HEMATOLOGIC A:  Anemia. P:  F/U CBC Transfuse for Hb < 7  NEUROLOGIC A:  Pain control. Hx of Depression. P:   PRN norco, morphine Continue elavil, prozac  Resolved problems >> DKA, anion gap acidosis  Transfer to SDU 2/17.  Followed in outpt IMTS clinic by Dr. Ezequiel Kayser.  Transfer to IMTS for 2/18 and PCCM sign off (D/w Dr. Elnora Morrison).  Chesley Mires, MD [facility] Pulmonary/Critical Care 07/09/2012, 1:58 PM Pager:  [phone] After 3pm call: [phone]

## 2012-07-09 NOTE — Anesthesia Procedure Notes (Signed)
Procedure Name: Intubation Date/Time: 07/09/2012 10:50 AM Performed by: Sherilyn Banker Pre-anesthesia Checklist: Patient identified, Emergency Drugs available, Suction available, Patient being monitored and Timeout performed Patient Re-evaluated:Patient Re-evaluated prior to inductionOxygen Delivery Method: Circle system utilized Preoxygenation: Pre-oxygenation with 100% oxygen Intubation Type: IV induction Ventilation: Mask ventilation without difficulty Laryngoscope Size: Mac and 4 Grade View: Grade III Tube type: Oral Number of attempts: 2 (mac 4 ) Airway Equipment and Method: Stylet Placement Confirmation: ETT inserted through vocal cords under direct vision,  positive ETCO2 and breath sounds checked- equal and bilateral Secured at: 21 cm Tube secured with: Tape Dental Injury: Teeth and Oropharynx as per pre-operative assessment

## 2012-07-09 NOTE — Progress Notes (Signed)
I did NOT examine this pt as she was taken to OR but abx recs as above.

## 2012-07-09 NOTE — Progress Notes (Signed)
UR COMPLETED  

## 2012-07-09 NOTE — Op Note (Signed)
DRESSING CHANGE, THIGH WOUND  Procedure Note  [name] [name] 07/06/2012 - 07/09/2012   Pre-op Diagnosis: LEFT THIGH WOUND     Post-op Diagnosis: same  Procedure(s): DRESSING CHANGE AND DEBRIDEMENT,  LEFT THIGH WOUND  Surgeon(s): Harl Bowie, MD  Anesthesia: General  Staff:  Circulator: Megan Day Cavanaugh, RN Scrub Person: Leslie Andrea  Estimated Blood Loss: Minimal               Procedure: The patient was brought to the operating room and identified as the correct patient. She was placed supine on the operating room table and general anesthesia was induced. The patient was then placed in the lithotomy position. I removed all the previous dressings. The wound was started shows some granulation tissue. There was still some necrotic debris medially which I excised with electrocautery. I probed the wound circumferentially and found no further extension of the necrosis. Hemostasis appeared to be achieved. I then packed the wound with wet to dry gauze. Dry gauze and implied. The patient tolerated the procedure well. All the counts were correct at the end of the procedure. The patient was then isolated the operative and taken in stable condition to the recovery room.          [name] [name]   Date: 07/09/2012  Time: 11:15 AM

## 2012-07-09 NOTE — Anesthesia Postprocedure Evaluation (Signed)
  Anesthesia Post-op Note  Patient: [name] [name]  Procedure(s) Performed: Procedure(s) (LRB): IRRIGATION AND DEBRIDEMENT ABSCESS BUTTOCKS AND THIGH (Left)  Patient Location: PACU  Anesthesia Type: General  Level of Consciousness: awake and alert   Airway and Oxygen Therapy: Patient Spontanous Breathing  Post-op Pain: mild  Post-op Assessment: Post-op Vital signs reviewed, Patient's Cardiovascular Status Stable, Respiratory Function Stable, Patent Airway and No signs of Nausea or vomiting  Last Vitals:  Filed Vitals:   07/09/12 0500  BP:   Pulse: 100  Temp:   Resp: 19    Post-op Vital Signs: stable   Complications: No apparent anesthesia complications

## 2012-07-09 NOTE — Progress Notes (Signed)
Decreased urine output; bladder scan with 400 cc; Foley not flushing;   Exchange Foley catheter.

## 2012-07-09 NOTE — Transfer of Care (Signed)
Immediate Anesthesia Transfer of Care Note  Patient: [name] [name]  Procedure(s) Performed: Procedure(s): DRESSING CHANGE, THIGH WOUND (Left)  Patient Location: PACU  Anesthesia Type:General  Level of Consciousness: sedated  Airway & Oxygen Therapy: Patient Spontanous Breathing and Patient connected to face mask oxygen  Post-op Assessment: Report given to PACU RN and Patient moving all extremities X 4  Post vital signs: Reviewed and stable  Complications: No apparent anesthesia complications

## 2012-07-09 NOTE — Anesthesia Postprocedure Evaluation (Signed)
  Anesthesia Post-op Note  Patient: [name] [name]  Procedure(s) Performed: Procedure(s) (LRB): DRESSING CHANGE, THIGH WOUND (Left)  Patient Location: PACU  Anesthesia Type: General  Level of Consciousness: awake and alert   Airway and Oxygen Therapy: Patient Spontanous Breathing  Post-op Pain: mild  Post-op Assessment: Post-op Vital signs reviewed, Patient's Cardiovascular Status Stable, Respiratory Function Stable, Patent Airway and No signs of Nausea or vomiting  Last Vitals:  Filed Vitals:   07/09/12 1300  BP:   Pulse: 102  Temp:   Resp: 18    Post-op Vital Signs: stable   Complications: No apparent anesthesia complications

## 2012-07-09 NOTE — Anesthesia Preprocedure Evaluation (Signed)
Anesthesia Evaluation  Patient identified by MRN, date of birth, ID band Patient awake    Reviewed: Allergy & Precautions, H&P , NPO status , Patient's Chart, lab work & pertinent test results  Airway Mallampati: II TM Distance: >3 FB Neck ROM: full    Dental no notable dental hx. (+) Teeth Intact and Dental Advidsory Given   Pulmonary neg pulmonary ROS,  breath sounds clear to auscultation  Pulmonary exam normal       Cardiovascular hypertension, On Medications and Pt. on medications Rhythm:regular Rate:Normal  ------------------------------------------------------------ History:   PMH:  GERD.  Risk factors:  Current tobacco use. Hypertension. Diabetes mellitus. Dyslipidemia.  ------------------------------------------------------------ Study Conclusions  11/13  - Left ventricle: The cavity size was normal. Wall thickness   was increased in a pattern of mild LVH. Systolic function   was vigorous. The estimated ejection fraction was in the   range of 65% to 70%. Wall motion was normal; there were no   regional wall motion abnormalities. Left ventricular   diastolic function parameters were normal. - Atrial septum: No defect or patent foramen ovale was   identified. - Pulmonary arteries: PA peak pressure: 12mm Hg (S). Transthoracic echocardiography.  M-mode, complete 2D, spectral Doppler, and color Doppler.  Height:  Height: 177.8cm. Height: 70in.  Weight:  Weight: 127.9kg. Weight: 281.4lb.  Body mass index:  BMI: 40.5kg/m^2.  Body surface area:    BSA: 2.70m^2.  Blood pressure:     118/82.  Patient status:  Inpatient.    Neuro/Psych Anxiety Depression  Neuromuscular disease negative neurological ROS  negative psych ROS   GI/Hepatic Neg liver ROS, GERD-  Medicated and Controlled,  Endo/Other  diabetes, Poorly Controlled, Type 2, Oral Hypoglycemic AgentsMorbid obesity  Renal/GU negative Renal ROS  negative  genitourinary   Musculoskeletal   Abdominal (+) + obese,   Peds  Hematology negative hematology ROS (+)   Anesthesia Other Findings   Reproductive/Obstetrics negative OB ROS                           Anesthesia Physical  Anesthesia Plan  ASA: III  Anesthesia Plan: General   Post-op Pain Management:    Induction: Intravenous  Airway Management Planned: Oral ETT  Additional Equipment:   Intra-op Plan:   Post-operative Plan: Extubation in OR  Informed Consent: I have reviewed the patients History and Physical, chart, labs and discussed the procedure including the risks, benefits and alternatives for the proposed anesthesia with the patient or authorized representative who has indicated his/her understanding and acceptance.   Dental advisory given  Plan Discussed with: CRNA  Anesthesia Plan Comments:         Anesthesia Quick Evaluation

## 2012-07-09 NOTE — Progress Notes (Signed)
Brief Summary of Hospital Course Internal Medicine Teaching Service  The patient is a 37 yo woman, history of DM, HTN, presenting 2/14 with a left leg abscess and DKA.  Summary of hospital course is as follows. 2/14 - admitted for necrotizing fasciitis of left groin, taken to OR for drainage and debridement.  Started on vanc, zosyn, and clinda.  Wound culture was drawn, which now shows rare staph aureus  Patient also found to be in DKA, with a bicarb of 13 and an anion gap of 26, and was placed on an insulin drip. 2/15 - patient taken back to OR for repeat debridement.   2/16 - insulin drip discontinued, patient transitioned to subcutaneous insulin.  LE dopplers were obtained, which showed no evidence of DVT.  Patient's creatinine increased to 2.15 (from 0.78 on admission). 2/17 - patient taken back to OR for repeat debridement.  Patient transferred from PCCM primary service to IMTS primary service  Antibiotics: Vancomycin 2/14>> Zosyn 2/14>> Clindamycin 2/14>>  Cultures: Blood 2/14>>NGTD Wound 2/14>>rare Staph aureus MRSA 2/14>>positive  Assessment/Plan # Necrotizing fasciitis - L thigh necrotizing fasciitis, present on admission. -greatly appreciate surgery consult, further debridements as they see necessary -continue antibiotic coverage with vanc, zosyn, clinda -follow blood and wound cultures, narrow antibiotics based on culture results  # AKI - unclear etiology, differential includes ATN (most likely vancomycin-induced vs hypotension to 87/61 on 2/15).  Unlikely post-renal (good UOP, foley in place) vs pre-renal (net positive 8 L since admission). -consider changing vanc to linezolid -daily BMET  # Diabetes Mellitus type 2 - presented in DKA, which resolved, though gap is 13 this morning, with blood sugars in the 200-300's -increase Lantus from 20 to 30 units qhs -continue SSI + 6  # HTN - well-controlled -holding home lisinopril in setting of borderline low BP's and AKI  #  Depression - stable -prozac  # Prophy - lovenox   Signed, Elnora Morrison, PGY2 07/09/2012, 4:01 PM

## 2012-07-10 DIAGNOSIS — E1149 Type 2 diabetes mellitus with other diabetic neurological complication: Secondary | ICD-10-CM

## 2012-07-10 DIAGNOSIS — E1142 Type 2 diabetes mellitus with diabetic polyneuropathy: Secondary | ICD-10-CM

## 2012-07-10 DIAGNOSIS — M726 Necrotizing fasciitis: Principal | ICD-10-CM

## 2012-07-10 LAB — CBC
Hemoglobin: 7.1 g/dL — ABNORMAL LOW (ref 12.0–15.0)
MCH: 25.5 pg — ABNORMAL LOW (ref 26.0–34.0)
MCV: 77.7 fL — ABNORMAL LOW (ref 78.0–100.0)
RBC: 2.78 MIL/uL — ABNORMAL LOW (ref 3.87–5.11)
WBC: 10 10*3/uL (ref 4.0–10.5)

## 2012-07-10 LAB — BASIC METABOLIC PANEL
BUN: 27 mg/dL — ABNORMAL HIGH (ref 6–23)
CO2: 17 mEq/L — ABNORMAL LOW (ref 19–32)
Calcium: 8.7 mg/dL (ref 8.4–10.5)
Creatinine, Ser: 2.09 mg/dL — ABNORMAL HIGH (ref 0.50–1.10)
GFR calc Af Amer: 34 mL/min — ABNORMAL LOW (ref >90)
GFR calc non Af Amer: 29 mL/min — ABNORMAL LOW (ref >90)
GFR calc non Af Amer: 29 mL/min — ABNORMAL LOW (ref >90)
Glucose, Bld: 290 mg/dL — ABNORMAL HIGH (ref 70–99)
Potassium: 4.3 mEq/L (ref 3.5–5.1)
Potassium: 5.3 mEq/L — ABNORMAL HIGH (ref 3.5–5.1)
Sodium: 133 mEq/L — ABNORMAL LOW (ref 135–145)

## 2012-07-10 LAB — WOUND CULTURE

## 2012-07-10 LAB — GLUCOSE, CAPILLARY

## 2012-07-10 MED ORDER — SODIUM CHLORIDE 0.9 % IV BOLUS (SEPSIS)
1000.0000 mL | Freq: Once | INTRAVENOUS | Status: AC
  Administered 2012-07-10: 1000 mL via INTRAVENOUS

## 2012-07-10 MED ORDER — HYDROMORPHONE HCL PF 1 MG/ML IJ SOLN: 0.5000 mg | INTRAMUSCULAR | Status: DC | PRN

## 2012-07-10 MED ORDER — HYDROMORPHONE HCL PF 1 MG/ML IJ SOLN
INTRAMUSCULAR | Status: AC
  Administered 2012-07-10: 2 mg
  Filled 2012-07-10: qty 2

## 2012-07-10 MED ORDER — INSULIN ASPART 100 UNIT/ML ~~LOC~~ SOLN
0.0000 [IU] | Freq: Three times a day (TID) | SUBCUTANEOUS | Status: DC
Start: 2012-07-10 — End: 2012-07-23
  Administered 2012-07-10 (×2): 7 [IU] via SUBCUTANEOUS
  Administered 2012-07-11 (×2): 4 [IU] via SUBCUTANEOUS
  Administered 2012-07-11 – 2012-07-12 (×2): 3 [IU] via SUBCUTANEOUS
  Administered 2012-07-12 (×2): 4 [IU] via SUBCUTANEOUS
  Administered 2012-07-13 (×2): 3 [IU] via SUBCUTANEOUS
  Administered 2012-07-14: 7 [IU] via SUBCUTANEOUS
  Administered 2012-07-15 (×2): 3 [IU] via SUBCUTANEOUS
  Administered 2012-07-22: 4 [IU] via SUBCUTANEOUS

## 2012-07-10 MED ORDER — POTASSIUM CHLORIDE CRYS ER 20 MEQ PO TBCR: 40.0000 meq | EXTENDED_RELEASE_TABLET | Freq: Once | ORAL | Status: DC

## 2012-07-10 NOTE — Progress Notes (Deleted)
eLink Physician-Brief Progress Note Patient Name: [name] [name] DOB: 07-22-1975 MRN: [id number]  Date of Service  07/10/2012   HPI/Events of Note  Hypokalemia   eICU Interventions  Potassium replaced   Intervention Category Minor Interventions: Electrolytes abnormality - evaluation and management  Suraj Ramdass 07/10/2012, 5:17 AM

## 2012-07-10 NOTE — Progress Notes (Signed)
Bladder scan done with residual of  373 cc's.  Tried to flush cath but blocked.  Removed and tip blocked with large amount of sedimentation.  #16 fr foley cath placed, pt. Tolerated without event. Large amount of sediment in cath draining yellow urine 550 cc's.

## 2012-07-10 NOTE — Progress Notes (Deleted)
Bladder scan done and patient has 373 cc's in bladder.  Md notified and old foley cath removed and new cath placed. # 16 fr cath placed without event. After foley placed, received 550 cc's of cloudy amber urine with large sedimentions.  Cont. To watch.

## 2012-07-10 NOTE — Progress Notes (Signed)
1 Day Post-Op  Subjective: No new complaints  Objective: Vital signs in last 24 hours: Temp:  [97.5 F (36.4 C)-99.5 F (37.5 C)] 99.5 F (37.5 C) (02/18 1125) Pulse Rate:  [97-111] 98 (02/18 0752) Resp:  [9-27] 19 (02/18 0752) BP: (110-126)/(55-85) 123/71 mmHg (02/18 0751) SpO2:  [94 %-100 %] 95 % (02/18 0752) Weight:  [324 lb 1.2 oz (147 kg)] 324 lb 1.2 oz (147 kg) (02/18 0500) Last BM Date: 07/06/12  Intake/Output from previous day: 02/17 0701 - 02/18 0700 In: 3770 [P.O.:720; I.V.:2925; IV Piggyback:125] Out: [id number] [Urine:1610] Intake/Output this shift: Total I/O In: 780 [P.O.:480; I.V.:300] Out: 50 [Urine:50]  Dressing changed at bedside.  Some mild necrosis at base.  She handled it fairly well with IV meds  Lab Results:   Recent Labs  07/09/12 0440 07/10/12 0547  WBC 11.3* 10.0  HGB 7.1* 7.1*  HCT 21.6* 21.6*  PLT 200 181   BMET  Recent Labs  07/09/12 0440 07/10/12 0547  NA 130* 133*  K 4.1 4.3  CL 100 103  CO2 17* 17*  GLUCOSE 310* 290*  BUN 29* 29*  CREATININE 2.29* 2.14*  CALCIUM 8.5 8.6   PT/INR No results found for this basename: LABPROT, INR,  in the last 72 hours ABG No results found for this basename: PHART, PCO2, PO2, HCO3,  in the last 72 hours  Studies/Results: No results found.  Anti-infectives: Anti-infectives   Start     Dose/Rate Route Frequency Ordered Stop   07/09/12 1800  vancomycin (VANCOCIN) 2,000 mg in sodium chloride 0.9 % 500 mL IVPB  Status:  Discontinued     2,000 mg 250 mL/hr over 120 Minutes Intravenous Every 24 hours 07/08/12 2308 07/10/12 1132   07/07/12 1000  vancomycin (VANCOCIN) 1,250 mg in sodium chloride 0.9 % 250 mL IVPB  Status:  Discontinued     1,250 mg 166.7 mL/hr over 90 Minutes Intravenous Every 12 hours 07/06/12 2110 07/08/12 2251   07/07/12 0030  clindamycin (CLEOCIN) IVPB 600 mg     600 mg 100 mL/hr over 30 Minutes Intravenous 3 times per day 07/07/12 0019     07/06/12 2200  linezolid (ZYVOX)  IVPB 600 mg  Status:  Discontinued     600 mg 300 mL/hr over 60 Minutes Intravenous Every 12 hours 07/06/12 1612 07/06/12 2050   07/06/12 2200  piperacillin-tazobactam (ZOSYN) IVPB 3.375 g     3.375 g 12.5 mL/hr over 240 Minutes Intravenous 3 times per day 07/06/12 1646     07/06/12 2115  vancomycin (VANCOCIN) 2,000 mg in sodium chloride 0.9 % 500 mL IVPB     2,000 mg 250 mL/hr over 120 Minutes Intravenous  Once 07/06/12 2110 07/07/12 0042   07/06/12 2100  vancomycin (VANCOCIN) IVPB 1000 mg/200 mL premix  Status:  Discontinued     1,000 mg 200 mL/hr over 60 Minutes Intravenous  Once 07/06/12 2050 07/06/12 2109   07/06/12 1830  piperacillin-tazobactam (ZOSYN) IVPB 3.375 g  Status:  Discontinued     3.375 g 100 mL/hr over 30 Minutes Intravenous 4 times per day 07/06/12 1825 07/06/12 1858   07/06/12 1830  vancomycin (VANCOCIN) IVPB 1000 mg/200 mL premix  Status:  Discontinued     1,000 mg 200 mL/hr over 60 Minutes Intravenous Every 12 hours 07/06/12 1825 07/06/12 1920   07/06/12 1615  linezolid (ZYVOX) IVPB 600 mg  Status:  Discontinued     600 mg 300 mL/hr over 60 Minutes Intravenous Every 12 hours 07/06/12 1610 07/06/12 1619  07/06/12 1300  piperacillin-tazobactam (ZOSYN) IVPB 3.375 g     3.375 g 100 mL/hr over 30 Minutes Intravenous  Once 07/06/12 1250 07/06/12 1450   07/06/12 1300  clindamycin (CLEOCIN) IVPB 600 mg     600 mg 100 mL/hr over 30 Minutes Intravenous  Once 07/06/12 1253 07/06/12 1352   07/06/12 1130  vancomycin (VANCOCIN) IVPB 1000 mg/200 mL premix     1,000 mg 200 mL/hr over 60 Minutes Intravenous  Once 07/06/12 1117 07/06/12 1230      Assessment/Plan: s/p Procedure(s): DRESSING CHANGE, THIGH WOUND (Left)  Will start trying bedside dressing changes  LOS: 4 days    [name] [name] A 07/10/2012

## 2012-07-10 NOTE — Progress Notes (Signed)
Subjective: Pt had no acute events overnight. Feeling slightly depressed about current situation. Pt afebrile.   Objective: Vital signs in last 24 hours: Filed Vitals:   07/10/12 0500 07/10/12 0751 07/10/12 0752 07/10/12 1125  BP:  123/71    Pulse:  97 98   Temp:   98.2 F (36.8 C) 99.5 F (37.5 C)  TempSrc:   Oral Oral  Resp:  20 19   Height:      Weight: 324 lb 1.2 oz (147 kg)     SpO2:  96% 95%    Weight change: 5 lb 8.2 oz (2.5 kg)  Intake/Output Summary (Last 24 hours) at 07/10/12 1155 Last data filed at 07/10/12 1122  Gross per 24 hour  Intake   3925 ml  Output   1425 ml  Net   2500 ml   General: resting in bed, NAD,  HEENT: PERRL, EOMI, no scleral icterus Cardiac: RRR, no rubs, murmurs or gallops Pulm: clear to auscultation bilaterally, moving normal volumes of air Abd: soft, nontender, nondistended, BS present Ext: warm and well perfused, no pedal edema, scattered pustules over face and UE all different stages of healing, left groin dressing from medial thigh to left labial draining serosanginous fluid, nttp, no crepitus Neuro: alert and oriented X3, cranial nerves II-XII grossly intact, depressed affect  Lab Results: Basic Metabolic Panel:  Recent Labs Lab 07/06/12 2203  07/08/12 0450  07/09/12 0440 07/10/12 0547  NA 131*  < > 126*  --  130* 133*  K 4.1  < > 4.2  --  4.1 4.3  CL 96  < > 97  --  100 103  CO2 19  < > 19  --  17* 17*  GLUCOSE 333*  < > 319*  < > 310* 290*  BUN 14  < > 25*  --  29* 29*  CREATININE 0.71  < > 2.15*  --  2.29* 2.14*  CALCIUM 9.2  < > 8.5  --  8.5 8.6  MG 1.9  --  2.2  --   --   --   PHOS  --   --  4.6  --   --   --   < > = values in this interval not displayed. Liver Function Tests:  Recent Labs Lab 07/06/12 2203  AST 12  ALT 13  ALKPHOS 165*  BILITOT 0.8  PROT 7.0  ALBUMIN 1.7*   No results found for this basename: LIPASE, AMYLASE,  in the last 168 hours No results found for this basename: AMMONIA,  in the last  168 hours CBC:  Recent Labs Lab 07/06/12 1042  07/09/12 0440 07/10/12 0547  WBC 10.3  < > 11.3* 10.0  NEUTROABS 7.7  --  8.3*  --   HGB 10.4*  < > 7.1* 7.1*  HCT 31.1*  < > 21.6* 21.6*  MCV 76.6*  < > 77.4* 77.7*  PLT 278  < > 200 181  < > = values in this interval not displayed. Cardiac Enzymes: No results found for this basename: CKTOTAL, CKMB, CKMBINDEX, TROPONINI,  in the last 168 hours BNP: No results found for this basename: PROBNP,  in the last 168 hours D-Dimer: No results found for this basename: DDIMER,  in the last 168 hours CBG:  Recent Labs Lab 07/08/12 1703 07/08/12 2201 07/09/12 0807 07/09/12 1208 07/09/12 1705 07/09/12 2149  GLUCAP 226* 262* 297* 252* 275* 310*   Hemoglobin A1C:  Recent Labs Lab 07/06/12 2203  HGBA1C 15.4*  Urine Drug Screen: Drugs of Abuse     Component Value Date/Time   LABOPIA NONE DETECTED 04/11/2011 1656   COCAINSCRNUR NONE DETECTED 04/11/2011 1656   LABBENZ NONE DETECTED 04/11/2011 1656   AMPHETMU NONE DETECTED 04/11/2011 1656   THCU NONE DETECTED 04/11/2011 1656   LABBARB NONE DETECTED 04/11/2011 1656    Urinalysis:  Recent Labs Lab 07/06/12 1200 07/09/12 0345  COLORURINE YELLOW AMBER*  LABSPEC 1.024 1.017  PHURINE 5.0 5.0  GLUCOSEU >1000* 250*  HGBUR SMALL* LARGE*  BILIRUBINUR MODERATE* NEGATIVE  KETONESUR >80* NEGATIVE  PROTEINUR 100* NEGATIVE  UROBILINOGEN 1.0 1.0  NITRITE NEGATIVE NEGATIVE  LEUKOCYTESUR NEGATIVE TRACE*   Micro Results: Recent Results (from the past 240 hour(s))  CULTURE, BLOOD (ROUTINE X 2)     Status: None   Collection Time    07/06/12 10:50 AM      Result Value Range Status   Specimen Description BLOOD LEFT ANTECUBITAL   Final   Special Requests BOTTLES DRAWN AEROBIC ONLY 10CC   Final   Culture  Setup Time 07/06/2012 18:19   Final   Culture     Final   Value:        BLOOD CULTURE RECEIVED NO GROWTH TO DATE CULTURE WILL BE HELD FOR 5 DAYS BEFORE ISSUING A FINAL NEGATIVE REPORT    Report Status PENDING   Incomplete  CULTURE, BLOOD (ROUTINE X 2)     Status: None   Collection Time    07/06/12 11:00 AM      Result Value Range Status   Specimen Description BLOOD RIGHT ANTECUBITAL   Final   Special Requests BOTTLES DRAWN AEROBIC ONLY 2CC   Final   Culture  Setup Time 07/06/2012 18:19   Final   Culture     Final   Value:        BLOOD CULTURE RECEIVED NO GROWTH TO DATE CULTURE WILL BE HELD FOR 5 DAYS BEFORE ISSUING A FINAL NEGATIVE REPORT   Report Status PENDING   Incomplete  WOUND CULTURE     Status: None   Collection Time    07/06/12  3:11 PM      Result Value Range Status   Specimen Description WOUND THIGH LEFT   Final   Special Requests PT ON ZOSYN,VANCOMYCIN AND CLEOCIN   Final   Gram Stain     Final   Value: RARE WBC PRESENT, PREDOMINANTLY PMN     NO SQUAMOUS EPITHELIAL CELLS SEEN     ABUNDANT GRAM POSITIVE RODS     FEW GRAM POSITIVE COCCI     IN PAIRS IN CLUSTERS   Culture     Final   Value: RARE METHICILLIN RESISTANT STAPHYLOCOCCUS AUREUS     Note: RIFAMPIN AND GENTAMICIN SHOULD NOT BE USED AS SINGLE DRUGS FOR TREATMENT OF STAPH INFECTIONS. This organism DOES NOT demonstrate inducible Clindamycin resistance in vitro. CRITICAL RESULT CALLED TO, READ BACK BY AND VERIFIED WITH: ROBIN WOFFORD BY      INGRAM A 2/18 8AM   Report Status 07/10/2012 FINAL   Final   Organism ID, Bacteria METHICILLIN RESISTANT STAPHYLOCOCCUS AUREUS   Final  ANAEROBIC CULTURE     Status: None   Collection Time    07/06/12  3:11 PM      Result Value Range Status   Specimen Description WOUND THIGH LEFT   Final   Special Requests PT ON ZOSYN,VANCOMYCIN AND CLEOCIN   Final   Gram Stain PENDING   Incomplete   Culture     Final  Value: NO ANAEROBES ISOLATED; CULTURE IN PROGRESS FOR 5 DAYS   Report Status PENDING   Incomplete  MRSA PCR SCREENING     Status: Abnormal   Collection Time    07/06/12  8:12 PM      Result Value Range Status   MRSA by PCR POSITIVE (*) NEGATIVE Final    Comment:            The GeneXpert MRSA Assay (FDA     approved for NASAL specimens     only), is one component of a     comprehensive MRSA colonization     surveillance program. It is not     intended to diagnose MRSA     infection nor to guide or     monitor treatment for     MRSA infections.     RESULT CALLED TO, READ BACK BY AND VERIFIED WITH:     S COGGINS RN 2.14.14 AT 2215 BY ROMEROJ   Studies/Results: No results found. Medications: I have reviewed the patient's current medications. Scheduled Meds: . amitriptyline  50 mg Oral QHS  . Chlorhexidine Gluconate Cloth  6 each Topical Q0600  . clindamycin (CLEOCIN) IV  600 mg Intravenous Q8H  . enoxaparin (LOVENOX) injection  40 mg Subcutaneous Q24H  . FLUoxetine  40 mg Oral Daily  . insulin aspart  0-20 Units Subcutaneous TID WC  . insulin aspart  0-5 Units Subcutaneous QHS  . insulin aspart  6 Units Subcutaneous TID WC  . insulin glargine  30 Units Subcutaneous QHS  . mupirocin ointment  1 application Nasal BID  . piperacillin-tazobactam (ZOSYN)  IV  3.375 g Intravenous Q8H   Continuous Infusions: . sodium chloride 100 mL/hr at 07/10/12 0300   PRN Meds:.sodium chloride, HYDROcodone-acetaminophen, HYDROmorphone (DILAUDID) injection, morphine injection, ondansetron (ZOFRAN) IV Assessment/Plan: 1. Necrotizing fascitis: Pt was admitted on 2/14 and taken emergently to OR for surgical debridement of site suspicious for nec fasc. Over L groin. Pt was then immediately started on Vanc/zosyn/clinda. Repeat debridement of necrotic tissue on 2/15, 2/16, 2/17. Minimal blood loss. Wound and tissue cultures pending.  -tissue culture MRSA R PCN/erythromycin and D-test negative.  -will d/c Vanc and cont Clinda and Zosyn -surgery following  2. ATN: Pt Cr 2.14  today from presumed baseline of 0.7 on admission. UA shows waxy/granular casts and uric acid crystals along with hematuria (RBCs 21-50). Some of hematuria maybe 2/2 traumatic foley  insertion.  -NS bolus -continue foley -repeat Bmet -d/c Vanc  3. Acute Anemia: Pt baseline Hgb 10 and today 7.1. Review of intraoperative notes doesn't appear to be etiology of bleeding. No active sites of bleeding.  -monitor CBC -transfuse <7.0  4. Poorly controlled DM type 2: Hgb A1c 15.4 and outpt meds include metformin and glipizide. Pt initially presented in DKA on 2/14 with AG of 24 today AG 12. No ketones in urine.  -cont SSI -pt advancing diet as tolerated -will need to manage total insulin requirement  5. Urinary retention: Pt does have foley and had bladder scan >300cc. Foley was flushed and wasn't draining.  -new foley -will continue to monitor -may need to get renal US or renal consult if doesn't improve in light of ATN  Dispo: Disposition is deferred at this time, awaiting improvement of current medical problems.  Anticipated discharge in approximately 3-5 day(s).   The patient does have a current PCP (PRIBULA,CHRISTOPHER, MD), therefore will be requiring OPC follow-up after discharge.   The patient does not have transportation limitations  that hinder transportation to clinic appointments.  .Services Needed at time of discharge: Y = Yes, Blank = No PT:   OT:   RN:   Equipment:   Other:     LOS: 4 days   Clinton Gallant 07/10/2012, 11:55 AM [id number]

## 2012-07-10 NOTE — Progress Notes (Signed)
Inpatient Diabetes Program Recommendations  AACE/ADA: New Consensus Statement on Inpatient Glycemic Control (2013)  Target Ranges:  Prepandial:   less than 140 mg/dL      Peak postprandial:   less than 180 mg/dL (1-2 hours)      Critically ill patients:  140 - 180 mg/dL    Received referral for this patient.  Admitted with groin wound.  S/P I&D.  Was diagnosed with diabetes at age 37 (per patient).  Results for [name] [name], [name] [name] (MRN [id number]) as of 07/10/2012 15:30  Ref. Range 01/02/2012 10:55 07/06/2012 22:03  Hemoglobin A1C Latest Range: <5.7 % 10.5 15.4 (H)   Current A1c 15.4%.  Per my conversation with this patient, patient told me she has taken insulin in the past.  Has used both insulin pens and vial and syringe.  Patient was extremely drowsy during our conversation and was only able to stay awake for short periods of time.  Explained to patient that she will likely need insulin for home use based on her current A1c.  Patient seemed disappointed but not surprised.  Explained to patient that we will have the RN review insulin administration here in hospital with her before she is d/c'd home.  Patient told me she was not checking her CBGs prior to admission.  Patient stated she does have a CBG meter that her mother gave her.  Encouraged patient to check her CBGs at least tid at home.  Reminded patient that she will need to keep her CBGs under tight control to improve/promote healing and to avoid further complications.  Expect poor to fair compliance.    Patient would greatly benefit from follow-up with Debera Lat, CDE in the [facility] Medicine [facility] after d/c.  Per records, I do not see that patient has any kind of health insurance.  If she is d/c'd home on insulin, we need to keep cost in mind.  Patient can purchase [employer] brand of Reli-on Novolin 70/30 insulin in a vial for $24.88 per vial.  She can also get Reli-on Novolin NPH and Reli-On Novolin Regular insulin for $24.88 per vial as  well.  Noted patient is currently on Lantus and Novolog, however, these insulins will likely be too expensive for patient ot of pocket unless she qualifies for Medicaid.  Will follow. Wyn Quaker RN, MSN, CDE Diabetes Coordinator Inpatient Diabetes Program [phone]

## 2012-07-10 NOTE — Consult Note (Addendum)
WOC consult Note Reason for Consult: Consult requested for left thigh and groin wounds.  Surgical team performed debridement in the OR on 2/17 and changed first post-op dressing recently. They plan to follow pt.  Will assess wound tomorrow during hydrotherapy with physical therapy.  Discussed plan of care with [facility] PA.  Pt will need adequate pain control to tolerate procedure.   Wound type: Full thickness Plan to assess at 1000 on 2/19 during dressing change.  Julien Girt, RN, MSN, [employer]  [phone]

## 2012-07-11 ENCOUNTER — Encounter (HOSPITAL_COMMUNITY): Payer: Self-pay | Admitting: Surgery

## 2012-07-11 LAB — CBC
HCT: 21.7 % — ABNORMAL LOW (ref 36.0–46.0)
Hemoglobin: 7.1 g/dL — ABNORMAL LOW (ref 12.0–15.0)
MCH: 25.5 pg — ABNORMAL LOW (ref 26.0–34.0)
MCHC: 32.7 g/dL (ref 30.0–36.0)
RBC: 2.78 MIL/uL — ABNORMAL LOW (ref 3.87–5.11)

## 2012-07-11 LAB — BASIC METABOLIC PANEL
BUN: 26 mg/dL — ABNORMAL HIGH (ref 6–23)
CO2: 16 mEq/L — ABNORMAL LOW (ref 19–32)
Calcium: 8.5 mg/dL (ref 8.4–10.5)
GFR calc non Af Amer: 31 mL/min — ABNORMAL LOW (ref >90)
Glucose, Bld: 171 mg/dL — ABNORMAL HIGH (ref 70–99)
Sodium: 134 mEq/L — ABNORMAL LOW (ref 135–145)

## 2012-07-11 LAB — GLUCOSE, CAPILLARY: Glucose-Capillary: 246 mg/dL — ABNORMAL HIGH (ref 70–99)

## 2012-07-11 LAB — ANAEROBIC CULTURE

## 2012-07-11 MED ORDER — MIDAZOLAM HCL 5 MG/5ML IJ SOLN
INTRAMUSCULAR | Status: DC | PRN
  Administered 2012-07-07 (×2): 1 mg via INTRAVENOUS

## 2012-07-11 MED ORDER — COLLAGENASE 250 UNIT/GM EX OINT
TOPICAL_OINTMENT | Freq: Every day | CUTANEOUS | Status: DC
  Administered 2012-07-12 – 2012-07-26 (×11): via TOPICAL
  Filled 2012-07-11 (×7): qty 30

## 2012-07-11 MED ORDER — SODIUM CHLORIDE 0.9 % IV BOLUS (SEPSIS)
1000.0000 mL | Freq: Once | INTRAVENOUS | Status: AC
  Administered 2012-07-11: 1000 mL via INTRAVENOUS

## 2012-07-11 MED ORDER — HYDROMORPHONE HCL PF 1 MG/ML IJ SOLN: 0.5000 mg | INTRAMUSCULAR | Status: DC

## 2012-07-11 MED ORDER — "BD GETTING STARTED TAKE HOME KIT: 1ML X 30 G SYRINGES, "
1.0000 | Freq: Once | Status: DC
  Filled 2012-07-11: qty 1

## 2012-07-11 MED ORDER — BD GETTING STARTED TAKE HOME KIT: 1/2ML X 30G SYRINGES
1.0000 | Freq: Once | Status: DC
  Filled 2012-07-11: qty 1

## 2012-07-11 MED ORDER — HYDROMORPHONE HCL PF 1 MG/ML IJ SOLN
0.5000 mg | Freq: Four times a day (QID) | INTRAMUSCULAR | Status: DC | PRN
  Administered 2012-07-11 – 2012-07-13 (×8): 1 mg via INTRAVENOUS
  Administered 2012-07-14: 2 mg via INTRAVENOUS
  Administered 2012-07-14 – 2012-07-16 (×7): 1 mg via INTRAVENOUS
  Administered 2012-07-16: 2 mg via INTRAVENOUS
  Administered 2012-07-17: 1 mg via INTRAVENOUS
  Filled 2012-07-11 (×6): qty 1
  Filled 2012-07-11: qty 2
  Filled 2012-07-11: qty 1
  Filled 2012-07-11: qty 2
  Filled 2012-07-11 (×10): qty 1

## 2012-07-11 NOTE — Progress Notes (Signed)
Current PIV to stay in per MD.

## 2012-07-11 NOTE — Progress Notes (Signed)
Physical Therapy Wound Treatment Patient Details  Name: [name] [name] MRN: [id number] Date of Birth: 12/06/1975  Today's Date: 07/11/2012 Time: 1003-1106 Time Calculation (min): 63 min  Subjective  Subjective: "I'm really nervous" Patient and Family Stated Goals: heal the wounds Date of Onset:  (prior to admission) Prior Treatments: Surgical I&D  Pain Score: Pain Score: 10-Worst pain ever (nsg aware and was able to medicate)   07/11/12 0745  Subjective Assessment  Subjective "I'm really nervous"  Patient and Family Stated Goals heal the wounds  Date of Onset (prior to admission)  Prior Treatments Surgical I&D  Evaluation and Treatment  Evaluation and Treatment Procedures Explained to Patient/Family Yes  Evaluation and Treatment Procedures agreed to  Wound 07/06/12 Thigh Left large abscess, open, draining serosanguinous fluid  Date First Assessed/Time First Assessed: 07/06/12 1200   Location: Thigh  Location Orientation: Left  Wound Description (Comments): large abscess, open, draining serosanguinous fluid  Present on Admission: Yes  Site / Wound Assessment Yellow  % Wound base Red or Granulating 0%  % Wound base Yellow 100%  % Wound base Black 0%  Peri-wound Assessment Denuded;Erythema (blanchable);Pink  Wound Length (cm) 7 cm  Wound Width (cm) 4 cm  Wound Depth (cm) 0.3 cm  Tunneling (cm) (anticipated to larger perineal wound)  Margins Unattacted edges (unapproximated)  Drainage Amount Minimal  Drainage Description Serosanguineous  Treatment Debridement (Selective);Hydrotherapy (Pulse lavage)  Dressing Type Barrier Film (skin prep);Gauze (Comment)  Dressing Changed New  Dressing Status Clean;Intact  Wound 07/06/12 Perineum Medial Open abscess  Date First Assessed/Time First Assessed: 07/06/12 1200   Location: Perineum  Location Orientation: Medial  Wound Description (Comments): Open abscess  Present on Admission: Yes  Site / Wound Assessment  Bleeding;Brown;Dusky;Granulation tissue;Red;Yellow  % Wound base Red or Granulating 20%  % Wound base Yellow 80%  % Wound base Black 0%  Peri-wound Assessment Erythema (blanchable);Maceration;Pink  Wound Length (cm) 24 cm  Wound Width (cm) 11 cm  Wound Depth (cm) 4.5 cm  Undermining (cm) 3  Margins Unattacted edges (unapproximated)  Closure None  Drainage Amount Moderate  Drainage Description Serosanguineous  Treatment Debridement (Selective);Hydrotherapy (Pulse lavage)  Dressing Type ABD;Barrier Film (skin prep);Gauze (Comment);Moist to dry (Kerlix and Gauze with santyl)  Dressing Changed New  Dressing Status Clean;Intact  Hydrotherapy  Pulsed Lavage with Suction (psi) 4 psi  Pulsed Lavage with Suction - Normal Saline Used 1000 mL (Recommend another 1000)  Pulsed Lavage Tip Tunneling tip  Pulsed lavage therapy - wound location perinieum; and thigh wounds  Selective Debridement  Selective Debridement - Location perinieum; and thigh wounds  Selective Debridement - Tools Used Forceps;Scissors  Selective Debridement - Tissue Removed necrotic tissue; yllow slough and adipose  Wound Therapy - Assess/Plan/Recommendations  Wound Therapy - Clinical Statement Pt is a pleasent 37 y.o. female who presents with large wound from necrotizing faciiatis.  Pt tolerated hydrotherapy well. Wound has significant margins and demonstrates very little granulation tissue.  Majority of wounds are comprised of necrotic tissue. Will continue to perform hydrotherapy with inclusion of santyl for chemical debridement to facilitate wound healing. Rec surgical team to reassess as wound bed may benefit from additional surgical debridement as opposed to lengthy hydrotherapy treatment.   Wound Therapy - Functional Problem List decreased mobility; increased pain, decreased skin itegrity  Factors Delaying/Impairing Wound Healing Altered sensation;Infection - systemic/local;Immobility;Vascular compromise  Hydrotherapy  Plan Debridement;Dressing change;Patient/family education;Pulsatile lavage with suction  Wound Therapy - Frequency 6X / week  Wound Therapy - Current Recommendations Surgery consult (reconsult  for possible further surgical management )  Wound Therapy - Follow Up Recommendations Home health RN  Wound Plan continue to treat as indicated  Wound Therapy Goals - Improve the function of patient's integumentary system by progressing the wound(s) through the phases of wound healing by:  Decrease Necrotic Tissue to <50% both wounds  Decrease Necrotic Tissue - Progress Goal set today  Increase Granulation Tissue to >50% both wounds  Increase Granulation Tissue - Progress Goal set today  Patient/Family will be able to  demontrate good physiological management contributions (eating well, participating in therapy; understanding of hygiene techniques)  Patient/Family Instruction Goal - Progress Goal set today  Goals/treatment plan/discharge plan were made with and agreed upon by patient/family Yes  Time For Goal Achievement 7 days  Wound Therapy - Potential for [place], PT DPT  [phone]

## 2012-07-11 NOTE — Consult Note (Signed)
WOC consult Note Reason for Consult: Consult requested for left inner groin and left perineal groin wounds.  Pt is followed by [facility] for assessment and plan of care. Assessed wounds with physical therapy prior to initiation of hydrotherapy. Wound type: Full thickness post-debridement wounds  Measurement: Refer to physical therapy notes for measurements.   Wound bed :Left upper thigh 100% yellow slough.  Left inner groin 80% slough, 20% red.  Drainage (amount, consistency, odor)  Mod brown drainage, strong odor.  Several patchy areas of eschar to left outer upper thigh.  Suspect wounds will evolve into connected areas as nonviable tissue is removed. Periwound: Tight when palpated skin surrounding wound Dressing procedure/placement/frequency: PT to perform hydrotherapy Q Mon-Sat to assist with removal of nonviable tissue.   Since significant amt of necrotic areas are involved, additional surgical debridement might be more successful and less time consuming then hydrotherapy. Will begin Santyl for chemical debridement of nonviable tissue.  Pt medicated prior to procedure and tolerated with mod amt discomfort.    Julien Girt, RN, MSN, [employer]  [phone]

## 2012-07-11 NOTE — Progress Notes (Signed)
2 Days Post-Op  Subjective: Still with significant pain but tolerated dressing change yesterday at bedside  Objective: Vital signs in last 24 hours: Temp:  [98.3 F (36.8 C)-100 F (37.8 C)] 98.4 F (36.9 C) (02/19 0802) Pulse Rate:  [98-103] 98 (02/19 0400) Resp:  [12-25] 25 (02/19 0400) BP: (114-132)/(65-91) 127/70 mmHg (02/19 0400) SpO2:  [96 %-99 %] 96 % (02/19 0400) Weight:  [332 lb 14.3 oz (151 kg)] 332 lb 14.3 oz (151 kg) (02/19 0400) Last BM Date: 07/06/12  Intake/Output from previous day: 02/18 0701 - 02/19 0700 In: 3140 [P.O.:1040; I.V.:2100] Out: 1750 [Urine:1750] Intake/Output this shift: Total I/O In: -  Out: 300 [Urine:300]  Left thigh dressing intact Thigh soft  Lab Results:   Recent Labs  07/09/12 0440 07/10/12 0547  WBC 11.3* 10.0  HGB 7.1* 7.1*  HCT 21.6* 21.6*  PLT 200 181   BMET  Recent Labs  07/10/12 0547 07/10/12 1810  NA 133* 131*  K 4.3 5.3*  CL 103 102  CO2 17* 15*  GLUCOSE 290* 197*  BUN 29* 27*  CREATININE 2.14* 2.09*  CALCIUM 8.6 8.7   PT/INR No results found for this basename: LABPROT, INR,  in the last 72 hours ABG No results found for this basename: PHART, PCO2, PO2, HCO3,  in the last 72 hours  Studies/Results: No results found.  Anti-infectives: Anti-infectives   Start     Dose/Rate Route Frequency Ordered Stop   07/09/12 1800  vancomycin (VANCOCIN) 2,000 mg in sodium chloride 0.9 % 500 mL IVPB  Status:  Discontinued     2,000 mg 250 mL/hr over 120 Minutes Intravenous Every 24 hours 07/08/12 2308 07/10/12 1132   07/07/12 1000  vancomycin (VANCOCIN) 1,250 mg in sodium chloride 0.9 % 250 mL IVPB  Status:  Discontinued     1,250 mg 166.7 mL/hr over 90 Minutes Intravenous Every 12 hours 07/06/12 2110 07/08/12 2251   07/07/12 0030  clindamycin (CLEOCIN) IVPB 600 mg     600 mg 100 mL/hr over 30 Minutes Intravenous 3 times per day 07/07/12 0019     07/06/12 2200  linezolid (ZYVOX) IVPB 600 mg  Status:  Discontinued      600 mg 300 mL/hr over 60 Minutes Intravenous Every 12 hours 07/06/12 1612 07/06/12 2050   07/06/12 2200  piperacillin-tazobactam (ZOSYN) IVPB 3.375 g  Status:  Discontinued     3.375 g 12.5 mL/hr over 240 Minutes Intravenous 3 times per day 07/06/12 1646 07/11/12 0751   07/06/12 2115  vancomycin (VANCOCIN) 2,000 mg in sodium chloride 0.9 % 500 mL IVPB     2,000 mg 250 mL/hr over 120 Minutes Intravenous  Once 07/06/12 2110 07/07/12 0042   07/06/12 2100  vancomycin (VANCOCIN) IVPB 1000 mg/200 mL premix  Status:  Discontinued     1,000 mg 200 mL/hr over 60 Minutes Intravenous  Once 07/06/12 2050 07/06/12 2109   07/06/12 1830  piperacillin-tazobactam (ZOSYN) IVPB 3.375 g  Status:  Discontinued     3.375 g 100 mL/hr over 30 Minutes Intravenous 4 times per day 07/06/12 1825 07/06/12 1858   07/06/12 1830  vancomycin (VANCOCIN) IVPB 1000 mg/200 mL premix  Status:  Discontinued     1,000 mg 200 mL/hr over 60 Minutes Intravenous Every 12 hours 07/06/12 1825 07/06/12 1920   07/06/12 1615  linezolid (ZYVOX) IVPB 600 mg  Status:  Discontinued     600 mg 300 mL/hr over 60 Minutes Intravenous Every 12 hours 07/06/12 1610 07/06/12 1619   07/06/12 1300  piperacillin-tazobactam (ZOSYN) IVPB 3.375 g     3.375 g 100 mL/hr over 30 Minutes Intravenous  Once 07/06/12 1250 07/06/12 1450   07/06/12 1300  clindamycin (CLEOCIN) IVPB 600 mg     600 mg 100 mL/hr over 30 Minutes Intravenous  Once 07/06/12 1253 07/06/12 1352   07/06/12 1130  vancomycin (VANCOCIN) IVPB 1000 mg/200 mL premix     1,000 mg 200 mL/hr over 60 Minutes Intravenous  Once 07/06/12 1117 07/06/12 1230      Assessment/Plan: s/p Procedure(s): DRESSING CHANGE, THIGH WOUND (Left)  Wound care team to start bedside dressing changes Continue antibiotics  LOS: 5 days    Kailoni Vahle A 07/11/2012

## 2012-07-11 NOTE — Progress Notes (Signed)
Subjective: Pt remains afebrile. Very drowsy today but good respiratory status.   Objective: Vital signs in last 24 hours: Filed Vitals:   07/10/12 2000 07/10/12 2330 07/11/12 0400 07/11/12 0802  BP: 132/91 114/65 127/70 119/71  Pulse: 103 102 98 95  Temp: 100 F (37.8 C) 99 F (37.2 C) 98.3 F (36.8 C) 98.4 F (36.9 C)  TempSrc: Oral Oral Oral Oral  Resp: 20 22 25 25   Height:      Weight:   332 lb 14.3 oz (151 kg)   SpO2: 97% 96% 96% 96%   Weight change: 8 lb 13.1 oz (4 kg)  Intake/Output Summary (Last 24 hours) at 07/11/12 0935 Last data filed at 07/11/12 0802  Gross per 24 hour  Intake   2660 ml  Output   2050 ml  Net    610 ml   General: resting in bed, very sleepy/drowsy HEENT: PERRL, EOMI, no scleral icterus Cardiac: RRR, no rubs, murmurs or gallops Pulm: clear to auscultation bilaterally, moving normal volumes of air Abd: soft, nontender, nondistended, BS present Ext: warm and well perfused, no pedal edema, scattered pustules over face and UE all different stages of healing, left groin dressing from medial thigh to left labial draining serosanginous fluid, nttp, no crepitus Neuro: alert and oriented X3, cranial nerves II-XII grossly intact, depressed affect  Lab Results: Basic Metabolic Panel:  Recent Labs Lab 07/06/12 2203  07/08/12 0450  07/10/12 1810 07/11/12 0746  NA 131*  < > 126*  < > 131* 134*  K 4.1  < > 4.2  < > 5.3* 4.8  CL 96  < > 97  < > 102 107  CO2 19  < > 19  < > 15* 16*  GLUCOSE 333*  < > 319*  < > 197* 171*  BUN 14  < > 25*  < > 27* 26*  CREATININE 0.71  < > 2.15*  < > 2.09* 2.00*  CALCIUM 9.2  < > 8.5  < > 8.7 8.5  MG 1.9  --  2.2  --   --   --   PHOS  --   --  4.6  --   --   --   < > = values in this interval not displayed. Liver Function Tests:  Recent Labs Lab 07/06/12 2203  AST 12  ALT 13  ALKPHOS 165*  BILITOT 0.8  PROT 7.0  ALBUMIN 1.7*   CBC:  Recent Labs Lab 07/06/12 1042  07/09/12 0440 07/10/12 0547  07/11/12 0746  WBC 10.3  < > 11.3* 10.0 12.0*  NEUTROABS 7.7  --  8.3*  --   --   HGB 10.4*  < > 7.1* 7.1* 7.1*  HCT 31.1*  < > 21.6* 21.6* 21.7*  MCV 76.6*  < > 77.4* 77.7* 78.1  PLT 278  < > 200 181 166  < > = values in this interval not displayed. CBG:  Recent Labs Lab 07/09/12 1208 07/09/12 1705 07/09/12 2149 07/10/12 1632 07/10/12 2146 07/11/12 0758  GLUCAP 252* 275* 310* 214* 166* 154*   Hemoglobin A1C:  Recent Labs Lab 07/06/12 2203  HGBA1C 15.4*   Urine Drug Screen: Drugs of Abuse     Component Value Date/Time   LABOPIA NONE DETECTED 04/11/2011 1656   COCAINSCRNUR NONE DETECTED 04/11/2011 1656   LABBENZ NONE DETECTED 04/11/2011 1656   AMPHETMU NONE DETECTED 04/11/2011 1656   THCU NONE DETECTED 04/11/2011 1656   LABBARB NONE DETECTED 04/11/2011 1656    Urinalysis:  Recent Labs Lab 07/06/12 1200 07/09/12 0345  COLORURINE YELLOW AMBER*  LABSPEC 1.024 1.017  PHURINE 5.0 5.0  GLUCOSEU >1000* 250*  HGBUR SMALL* LARGE*  BILIRUBINUR MODERATE* NEGATIVE  KETONESUR >80* NEGATIVE  PROTEINUR 100* NEGATIVE  UROBILINOGEN 1.0 1.0  NITRITE NEGATIVE NEGATIVE  LEUKOCYTESUR NEGATIVE TRACE*   Micro Results: Recent Results (from the past 240 hour(s))  CULTURE, BLOOD (ROUTINE X 2)     Status: None   Collection Time    07/06/12 10:50 AM      Result Value Range Status   Specimen Description BLOOD LEFT ANTECUBITAL   Final   Special Requests BOTTLES DRAWN AEROBIC ONLY 10CC   Final   Culture  Setup Time 07/06/2012 18:19   Final   Culture     Final   Value:        BLOOD CULTURE RECEIVED NO GROWTH TO DATE CULTURE WILL BE HELD FOR 5 DAYS BEFORE ISSUING A FINAL NEGATIVE REPORT   Report Status PENDING   Incomplete  CULTURE, BLOOD (ROUTINE X 2)     Status: None   Collection Time    07/06/12 11:00 AM      Result Value Range Status   Specimen Description BLOOD RIGHT ANTECUBITAL   Final   Special Requests BOTTLES DRAWN AEROBIC ONLY 2CC   Final   Culture  Setup Time  07/06/2012 18:19   Final   Culture     Final   Value:        BLOOD CULTURE RECEIVED NO GROWTH TO DATE CULTURE WILL BE HELD FOR 5 DAYS BEFORE ISSUING A FINAL NEGATIVE REPORT   Report Status PENDING   Incomplete  WOUND CULTURE     Status: None   Collection Time    07/06/12  3:11 PM      Result Value Range Status   Specimen Description WOUND THIGH LEFT   Final   Special Requests PT ON ZOSYN,VANCOMYCIN AND CLEOCIN   Final   Gram Stain     Final   Value: RARE WBC PRESENT, PREDOMINANTLY PMN     NO SQUAMOUS EPITHELIAL CELLS SEEN     ABUNDANT GRAM POSITIVE RODS     FEW GRAM POSITIVE COCCI     IN PAIRS IN CLUSTERS   Culture     Final   Value: RARE METHICILLIN RESISTANT STAPHYLOCOCCUS AUREUS     Note: RIFAMPIN AND GENTAMICIN SHOULD NOT BE USED AS SINGLE DRUGS FOR TREATMENT OF STAPH INFECTIONS. This organism DOES NOT demonstrate inducible Clindamycin resistance in vitro. CRITICAL RESULT CALLED TO, READ BACK BY AND VERIFIED WITH: ROBIN WOFFORD BY      INGRAM A 2/18 8AM   Report Status 07/10/2012 FINAL   Final   Organism ID, Bacteria METHICILLIN RESISTANT STAPHYLOCOCCUS AUREUS   Final  ANAEROBIC CULTURE     Status: None   Collection Time    07/06/12  3:11 PM      Result Value Range Status   Specimen Description WOUND THIGH LEFT   Final   Special Requests PT ON ZOSYN,VANCOMYCIN AND CLEOCIN   Final   Gram Stain PENDING   Incomplete   Culture     Final   Value: NO ANAEROBES ISOLATED; CULTURE IN PROGRESS FOR 5 DAYS   Report Status PENDING   Incomplete  MRSA PCR SCREENING     Status: Abnormal   Collection Time    07/06/12  8:12 PM      Result Value Range Status   MRSA by PCR POSITIVE (*) NEGATIVE Final  Comment:            The GeneXpert MRSA Assay (FDA     approved for NASAL specimens     only), is one component of a     comprehensive MRSA colonization     surveillance program. It is not     intended to diagnose MRSA     infection nor to guide or     monitor treatment for     MRSA  infections.     RESULT CALLED TO, READ BACK BY AND VERIFIED WITH:     S COGGINS RN 2.14.14 AT 2215 BY ROMEROJ   Studies/Results: No results found. Medications: I have reviewed the patient's current medications. Scheduled Meds: . amitriptyline  50 mg Oral QHS  . Chlorhexidine Gluconate Cloth  6 each Topical Q0600  . clindamycin (CLEOCIN) IV  600 mg Intravenous Q8H  . enoxaparin (LOVENOX) injection  40 mg Subcutaneous Q24H  . FLUoxetine  40 mg Oral Daily  .  HYDROmorphone (DILAUDID) injection  0.5-2 mg Intravenous Q6 weeks  . insulin aspart  0-20 Units Subcutaneous TID WC  . insulin aspart  0-5 Units Subcutaneous QHS  . insulin aspart  6 Units Subcutaneous TID WC  . insulin glargine  30 Units Subcutaneous QHS  . mupirocin ointment  1 application Nasal BID  . sodium chloride  1,000 mL Intravenous Once   Continuous Infusions: . sodium chloride 100 mL/hr at 07/11/12 0600   PRN Meds:.sodium chloride, HYDROcodone-acetaminophen, morphine injection, ondansetron (ZOFRAN) IV Assessment/Plan: 1. Necrotizing fascitis: Pt was admitted on 2/14 and taken emergently to OR for surgical debridement of site suspicious for nec fasc. Over L groin. Pt was then immediately started on Vanc/zosyn/clinda. Repeat debridement of necrotic tissue on 2/15, 2/16, 2/17. Minimal blood loss. Wound and tissue cultures pending.  -tissue culture MRSA R PCN/erythromycin and D-test negative.  -continue Clindamycin -surgery following and recommend WOC to attend to dressing changes -pain control with dilaudid and Vicodin given mentation will decrease frequency of dilaudid  2. ATN: Pt Cr 2.00 (downtrending and responding to fluids)  today from presumed baseline of 0.7 on admission. UA shows waxy/granular casts and uric acid crystals along with hematuria (RBCs 21-50). Some of hematuria maybe 2/2 traumatic foley insertion.  -NS bolus again today -continue foley -repeat Bmet  3. Acute Anemia: Pt baseline Hgb 10 and today 7.1  (stable). Review of intraoperative notes doesn't appear to be etiology of bleeding. No active sites of bleeding.  -monitor CBC -transfuse <7.0  4. Poorly controlled DM type 2: Hgb A1c 15.4 and outpt meds include metformin and glipizide. Pt initially presented in DKA on 2/14 with AG of 24 today AG 12. No ketones in urine.  -cont SSI -pt advancing diet as tolerated -will need to manage total insulin requirement -DM educator  5. Urinary retention- resolved: Pt does have foley and had bladder scan >300cc. Foley was flushed and wasn't draining.  -new foley -will continue to monitor -may need to get renal US or renal consult if doesn't improve in light of ATN  Dispo: Disposition is deferred at this time, awaiting improvement of current medical problems.  Anticipated discharge in approximately 3-5 day(s).   The patient does have a current PCP (PRIBULA,CHRISTOPHER, MD), therefore will be requiring OPC follow-up after discharge.   The patient does not have transportation limitations that hinder transportation to clinic appointments.  .Services Needed at time of discharge: Y = Yes, Blank = No PT:   OT:   RN:  Equipment:   Other:     LOS: 5 days   Clinton Gallant 07/11/2012, 9:35 AM [phone]

## 2012-07-11 NOTE — Addendum Note (Signed)
Addendum created 07/11/12 0654 by Sherilyn Banker, CRNA   Modules edited: Anesthesia Medication Administration

## 2012-07-12 DIAGNOSIS — N179 Acute kidney failure, unspecified: Secondary | ICD-10-CM | POA: Diagnosis present

## 2012-07-12 DIAGNOSIS — Z9119 Patient's noncompliance with other medical treatment and regimen: Secondary | ICD-10-CM

## 2012-07-12 DIAGNOSIS — D62 Acute posthemorrhagic anemia: Secondary | ICD-10-CM | POA: Diagnosis present

## 2012-07-12 DIAGNOSIS — E785 Hyperlipidemia, unspecified: Secondary | ICD-10-CM

## 2012-07-12 DIAGNOSIS — E119 Type 2 diabetes mellitus without complications: Secondary | ICD-10-CM | POA: Diagnosis present

## 2012-07-12 DIAGNOSIS — N170 Acute kidney failure with tubular necrosis: Secondary | ICD-10-CM | POA: Diagnosis present

## 2012-07-12 DIAGNOSIS — G8918 Other acute postprocedural pain: Secondary | ICD-10-CM | POA: Diagnosis present

## 2012-07-12 DIAGNOSIS — E1110 Type 2 diabetes mellitus with ketoacidosis without coma: Secondary | ICD-10-CM | POA: Diagnosis present

## 2012-07-12 DIAGNOSIS — E876 Hypokalemia: Secondary | ICD-10-CM | POA: Diagnosis not present

## 2012-07-12 DIAGNOSIS — E1151 Type 2 diabetes mellitus with diabetic peripheral angiopathy without gangrene: Secondary | ICD-10-CM | POA: Diagnosis present

## 2012-07-12 DIAGNOSIS — Z91199 Patient's noncompliance with other medical treatment and regimen due to unspecified reason: Secondary | ICD-10-CM

## 2012-07-12 DIAGNOSIS — A419 Sepsis, unspecified organism: Secondary | ICD-10-CM | POA: Diagnosis present

## 2012-07-12 HISTORY — DX: Acute kidney failure, unspecified: N17.9

## 2012-07-12 HISTORY — DX: Chronic kidney disease, unspecified: N17.9

## 2012-07-12 LAB — BASIC METABOLIC PANEL
BUN: 25 mg/dL — ABNORMAL HIGH (ref 6–23)
Calcium: 8.4 mg/dL (ref 8.4–10.5)
Creatinine, Ser: 2.1 mg/dL — ABNORMAL HIGH (ref 0.50–1.10)
GFR calc Af Amer: 34 mL/min — ABNORMAL LOW (ref >90)
GFR calc non Af Amer: 29 mL/min — ABNORMAL LOW (ref >90)
Glucose, Bld: 175 mg/dL — ABNORMAL HIGH (ref 70–99)

## 2012-07-12 LAB — GLUCOSE, CAPILLARY
Glucose-Capillary: 141 mg/dL — ABNORMAL HIGH (ref 70–99)
Glucose-Capillary: 137 mg/dL — ABNORMAL HIGH (ref 70–99)

## 2012-07-12 LAB — CBC
HCT: 20.9 % — ABNORMAL LOW (ref 36.0–46.0)
Hemoglobin: 6.8 g/dL — CL (ref 12.0–15.0)
Hemoglobin: 7.2 g/dL — ABNORMAL LOW (ref 12.0–15.0)
MCH: 25.5 pg — ABNORMAL LOW (ref 26.0–34.0)
MCHC: 32.5 g/dL (ref 30.0–36.0)
Platelets: 199 10*3/uL (ref 150–400)
RBC: 2.77 MIL/uL — ABNORMAL LOW (ref 3.87–5.11)
RDW: 15.4 % (ref 11.5–15.5)
WBC: 13.5 10*3/uL — ABNORMAL HIGH (ref 4.0–10.5)

## 2012-07-12 LAB — CULTURE, BLOOD (ROUTINE X 2): Culture: NO GROWTH

## 2012-07-12 LAB — ABO/RH: ABO/RH(D): B POS

## 2012-07-12 LAB — PREPARE RBC (CROSSMATCH)

## 2012-07-12 MED ORDER — SODIUM CHLORIDE 0.9 % IV BOLUS (SEPSIS)
1000.0000 mL | Freq: Once | INTRAVENOUS | Status: AC
  Administered 2012-07-12: 1000 mL via INTRAVENOUS

## 2012-07-12 MED ORDER — SODIUM CHLORIDE 0.9 % IJ SOLN
10.0000 mL | Freq: Two times a day (BID) | INTRAMUSCULAR | Status: DC
  Administered 2012-07-12 – 2012-07-13 (×2): 10 mL
  Administered 2012-07-13: 20 mL
  Administered 2012-07-14 – 2012-07-15 (×3): 10 mL
  Administered 2012-07-16: 20 mL
  Administered 2012-07-16 – 2012-07-25 (×10): 10 mL
  Administered 2012-07-26: 20 mL
  Administered 2012-07-26: 10 mL
  Filled 2012-07-12 (×2): qty 10
  Filled 2012-07-12: qty 20
  Filled 2012-07-12: qty 10
  Filled 2012-07-12: qty 20

## 2012-07-12 MED ORDER — INSULIN GLARGINE 100 UNIT/ML ~~LOC~~ SOLN
34.0000 [IU] | Freq: Every day | SUBCUTANEOUS | Status: DC
  Administered 2012-07-12 – 2012-07-17 (×5): 34 [IU] via SUBCUTANEOUS

## 2012-07-12 MED ORDER — SODIUM CHLORIDE 0.9 % IJ SOLN
10.0000 mL | INTRAMUSCULAR | Status: DC | PRN
  Administered 2012-07-18 – 2012-07-25 (×8): 10 mL
  Filled 2012-07-12: qty 10
  Filled 2012-07-12 (×3): qty 20

## 2012-07-12 NOTE — Progress Notes (Signed)
Subjective: Pt remains afebrile. WOC concern for increasing necrotic tissue and need for further debridement. Pt losing PIV access and therefore was not able to receive blood products overnight for dropping Hgb.   Objective: Vital signs in last 24 hours: Filed Vitals:   07/11/12 2010 07/12/12 0000 07/12/12 0405 07/12/12 0733  BP: 125/73 108/59 122/74   Pulse: 96 101 96   Temp: 98 F (36.7 C) 98.3 F (36.8 C)  98.4 F (36.9 C)  TempSrc: Oral Oral Oral Oral  Resp: 24 26 22    Height:      Weight:   336 lb 10.3 oz (152.7 kg)   SpO2: 97% 99% 97%    Weight change: 3 lb 12 oz (1.7 kg)  Intake/Output Summary (Last 24 hours) at 07/12/12 0733 Last data filed at 07/12/12 [id number]  Gross per 24 hour  Intake   3300 ml  Output   2402 ml  Net    898 ml   General: resting in bed, arousable and less drowsy HEENT: PERRL, EOMI, no scleral icterus Cardiac: RRR, no rubs, murmurs or gallops Pulm: clear to auscultation bilaterally, moving normal volumes of air Abd: soft, nontender, nondistended, BS present Ext: warm and well perfused, no pedal edema, scattered pustules over face and UE all different stages of healing, left groin dressing from medial thigh to left labial draining serosanginous fluid, nttp, no crepitus, but increasing induration around bandage borders Neuro: alert and oriented X3, cranial nerves II-XII grossly intact, depressed affect  Lab Results: Basic Metabolic Panel:  Recent Labs Lab 07/06/12 2203  07/08/12 0450  07/11/12 0746 07/12/12 0445  NA 131*  < > 126*  < > 134* 137  K 4.1  < > 4.2  < > 4.8 4.3  CL 96  < > 97  < > 107 108  CO2 19  < > 19  < > 16* 18*  GLUCOSE 333*  < > 319*  < > 171* 175*  BUN 14  < > 25*  < > 26* 25*  CREATININE 0.71  < > 2.15*  < > 2.00* 2.10*  CALCIUM 9.2  < > 8.5  < > 8.5 8.4  MG 1.9  --  2.2  --   --   --   PHOS  --   --  4.6  --   --   --   < > = values in this interval not displayed. Liver Function Tests:  Recent Labs Lab  07/06/12 2203  AST 12  ALT 13  ALKPHOS 165*  BILITOT 0.8  PROT 7.0  ALBUMIN 1.7*   CBC:  Recent Labs Lab 07/06/12 1042  07/09/12 0440  07/11/12 0746 07/12/12 0445  WBC 10.3  < > 11.3*  < > 12.0* 11.7*  NEUTROABS 7.7  --  8.3*  --   --   --   HGB 10.4*  < > 7.1*  < > 7.1* 6.8*  HCT 31.1*  < > 21.6*  < > 21.7* 20.9*  MCV 76.6*  < > 77.4*  < > 78.1 78.3  PLT 278  < > 200  < > 166 191  < > = values in this interval not displayed. CBG:  Recent Labs Lab 07/10/12 1632 07/10/12 2146 07/11/12 0758 07/11/12 1321 07/11/12 1727 07/11/12 2142  GLUCAP 214* 166* 154* 148* 188* 253*   Hemoglobin A1C:  Recent Labs Lab 07/06/12 2203  HGBA1C 15.4*   Urine Drug Screen: Drugs of Abuse     Component Value Date/Time  LABOPIA NONE DETECTED 04/11/2011 1656   COCAINSCRNUR NONE DETECTED 04/11/2011 1656   LABBENZ NONE DETECTED 04/11/2011 1656   AMPHETMU NONE DETECTED 04/11/2011 1656   THCU NONE DETECTED 04/11/2011 1656   LABBARB NONE DETECTED 04/11/2011 1656    Urinalysis:  Recent Labs Lab 07/06/12 1200 07/09/12 0345  COLORURINE YELLOW AMBER*  LABSPEC 1.024 1.017  PHURINE 5.0 5.0  GLUCOSEU >1000* 250*  HGBUR SMALL* LARGE*  BILIRUBINUR MODERATE* NEGATIVE  KETONESUR >80* NEGATIVE  PROTEINUR 100* NEGATIVE  UROBILINOGEN 1.0 1.0  NITRITE NEGATIVE NEGATIVE  LEUKOCYTESUR NEGATIVE TRACE*   Micro Results: Recent Results (from the past 240 hour(s))  CULTURE, BLOOD (ROUTINE X 2)     Status: None   Collection Time    07/06/12 10:50 AM      Result Value Range Status   Specimen Description BLOOD LEFT ANTECUBITAL   Final   Special Requests BOTTLES DRAWN AEROBIC ONLY 10CC   Final   Culture  Setup Time 07/06/2012 18:19   Final   Culture     Final   Value:        BLOOD CULTURE RECEIVED NO GROWTH TO DATE CULTURE WILL BE HELD FOR 5 DAYS BEFORE ISSUING A FINAL NEGATIVE REPORT   Report Status PENDING   Incomplete  CULTURE, BLOOD (ROUTINE X 2)     Status: None   Collection Time     07/06/12 11:00 AM      Result Value Range Status   Specimen Description BLOOD RIGHT ANTECUBITAL   Final   Special Requests BOTTLES DRAWN AEROBIC ONLY 2CC   Final   Culture  Setup Time 07/06/2012 18:19   Final   Culture     Final   Value:        BLOOD CULTURE RECEIVED NO GROWTH TO DATE CULTURE WILL BE HELD FOR 5 DAYS BEFORE ISSUING A FINAL NEGATIVE REPORT   Report Status PENDING   Incomplete  WOUND CULTURE     Status: None   Collection Time    07/06/12  3:11 PM      Result Value Range Status   Specimen Description WOUND THIGH LEFT   Final   Special Requests PT ON ZOSYN,VANCOMYCIN AND CLEOCIN   Final   Gram Stain     Final   Value: RARE WBC PRESENT, PREDOMINANTLY PMN     NO SQUAMOUS EPITHELIAL CELLS SEEN     ABUNDANT GRAM POSITIVE RODS     FEW GRAM POSITIVE COCCI     IN PAIRS IN CLUSTERS   Culture     Final   Value: RARE METHICILLIN RESISTANT STAPHYLOCOCCUS AUREUS     Note: RIFAMPIN AND GENTAMICIN SHOULD NOT BE USED AS SINGLE DRUGS FOR TREATMENT OF STAPH INFECTIONS. This organism DOES NOT demonstrate inducible Clindamycin resistance in vitro. CRITICAL RESULT CALLED TO, READ BACK BY AND VERIFIED WITH: ROBIN WOFFORD BY      INGRAM A 2/18 8AM   Report Status 07/10/2012 FINAL   Final   Organism ID, Bacteria METHICILLIN RESISTANT STAPHYLOCOCCUS AUREUS   Final  ANAEROBIC CULTURE     Status: None   Collection Time    07/06/12  3:11 PM      Result Value Range Status   Specimen Description WOUND THIGH LEFT   Final   Special Requests PT ON ZOSYN,VANCOMYCIN AND CLEOCIN   Final   Gram Stain     Final   Value: RARE WBC PRESENT, PREDOMINANTLY PMN     NO SQUAMOUS EPITHELIAL CELLS SEEN     ABUNDANT  GRAM POSITIVE RODS     FEW GRAM POSITIVE COCCI IN PAIRS     IN CLUSTERS   Culture NO ANAEROBES ISOLATED   Final   Report Status 07/11/2012 FINAL   Final  MRSA PCR SCREENING     Status: Abnormal   Collection Time    07/06/12  8:12 PM      Result Value Range Status   MRSA by PCR POSITIVE (*)  NEGATIVE Final   Comment:            The GeneXpert MRSA Assay (FDA     approved for NASAL specimens     only), is one component of a     comprehensive MRSA colonization     surveillance program. It is not     intended to diagnose MRSA     infection nor to guide or     monitor treatment for     MRSA infections.     RESULT CALLED TO, READ BACK BY AND VERIFIED WITH:     S COGGINS RN 2.14.14 AT 2215 BY ROMEROJ   Studies/Results: No results found. Medications: I have reviewed the patient's current medications. Scheduled Meds: . amitriptyline  50 mg Oral QHS  . bd getting started take home kit  1 kit Other Once  . Chlorhexidine Gluconate Cloth  6 each Topical Q0600  . clindamycin (CLEOCIN) IV  600 mg Intravenous Q8H  . collagenase   Topical Daily  . enoxaparin (LOVENOX) injection  40 mg Subcutaneous Q24H  . FLUoxetine  40 mg Oral Daily  . insulin aspart  0-20 Units Subcutaneous TID WC  . insulin aspart  0-5 Units Subcutaneous QHS  . insulin aspart  6 Units Subcutaneous TID WC  . insulin glargine  30 Units Subcutaneous QHS  . mupirocin ointment  1 application Nasal BID   Continuous Infusions: . sodium chloride 100 mL/hr at 07/11/12 0600   PRN Meds:.sodium chloride, HYDROcodone-acetaminophen, HYDROmorphone (DILAUDID) injection, morphine injection, ondansetron (ZOFRAN) IV Assessment/Plan: 1. Necrotizing fascitis: Pt was admitted on 2/14 and taken emergently to OR for surgical debridement of site suspicious for nec fasc. Over L groin. Pt was then immediately started on Vanc/zosyn/clinda. Repeat debridement of necrotic tissue on 2/15, 2/16, 2/17. Minimal blood loss. Wound and tissue cultures pending. Tissue culture MRSA R PCN/erythromycin and D-test negative. WOC concered that may need another debridement.  -continue Clindamycin -surgery following and recommend WOC to attend to dressing changes  2. ATN: Pt Cr 2.00 (downtrending and responding to fluids)  today from presumed baseline of  0.7 on admission. UA shows waxy/granular casts and uric acid crystals along with hematuria (RBCs 21-50). Some of hematuria maybe 2/2 traumatic foley insertion.  -NS bolus again today -continue foley -repeat Bmet -PICC placement with double lumen  3. Acute Anemia: Pt baseline Hgb 10 and today 6.8 overnight pt received PRBCs. Review of intraoperative notes doesn't appear to be etiology of bleeding. No active sites of bleeding. Post transfusion CBC.  -monitor CBC -transfuse <7.0 -pt will receive 1 PRBCs once PICC placed and then post-transfusion CBC  4. Poorly controlled DM type 2: Hgb A1c 15.4 and outpt meds include metformin and glipizide. Pt initially presented in DKA on 2/14 with AG of 24 today AG 12. No ketones in urine.  -cont SSI -carb modified -will need to manage total insulin requirement -DM educator   Dispo: Disposition is deferred at this time, awaiting improvement of current medical problems.  Anticipated discharge in approximately 3-5 day(s).   The patient does  have a current PCP (PRIBULA,CHRISTOPHER, MD), therefore will be requiring OPC follow-up after discharge.   The patient does not have transportation limitations that hinder transportation to clinic appointments.  .Services Needed at time of discharge: Y = Yes, Blank = No PT:   OT:   RN:   Equipment:   Other:     LOS: 6 days   Clinton Gallant 07/12/2012, 7:33 AM [phone]

## 2012-07-12 NOTE — Progress Notes (Signed)
Utilization review completed.  

## 2012-07-12 NOTE — Progress Notes (Signed)
3 Days Post-Op  Subjective: Patient reports less pain Able to move better  Objective: Vital signs in last 24 hours: Temp:  [98 F (36.7 C)-98.4 F (36.9 C)] 98.4 F (36.9 C) (02/20 0733) Pulse Rate:  [96-101] 96 (02/20 0405) Resp:  [22-26] 22 (02/20 0405) BP: (108-125)/(59-89) 122/74 mmHg (02/20 0405) SpO2:  [97 %-99 %] 97 % (02/20 0405) Weight:  [336 lb 10.3 oz (152.7 kg)] 336 lb 10.3 oz (152.7 kg) (02/20 0405) Last BM Date: 07/06/12  Intake/Output from previous day: 02/19 0701 - 02/20 0700 In: 3300 [I.V.:2300; IV Piggyback:1000] Out: 1452 [Urine:1450; Stool:2] Intake/Output this shift: Total I/O In: -  Out: 950 [Urine:950]  Thigh softer and less tender Dressings intact  Lab Results:   Recent Labs  07/11/12 0746 07/12/12 0445  WBC 12.0* 11.7*  HGB 7.1* 6.8*  HCT 21.7* 20.9*  PLT 166 191   BMET  Recent Labs  07/11/12 0746 07/12/12 0445  NA 134* 137  K 4.8 4.3  CL 107 108  CO2 16* 18*  GLUCOSE 171* 175*  BUN 26* 25*  CREATININE 2.00* 2.10*  CALCIUM 8.5 8.4   PT/INR No results found for this basename: LABPROT, INR,  in the last 72 hours ABG No results found for this basename: PHART, PCO2, PO2, HCO3,  in the last 72 hours  Studies/Results: No results found.  Anti-infectives: Anti-infectives   Start     Dose/Rate Route Frequency Ordered Stop   07/09/12 1800  vancomycin (VANCOCIN) 2,000 mg in sodium chloride 0.9 % 500 mL IVPB  Status:  Discontinued     2,000 mg 250 mL/hr over 120 Minutes Intravenous Every 24 hours 07/08/12 2308 07/10/12 1132   07/07/12 1000  vancomycin (VANCOCIN) 1,250 mg in sodium chloride 0.9 % 250 mL IVPB  Status:  Discontinued     1,250 mg 166.7 mL/hr over 90 Minutes Intravenous Every 12 hours 07/06/12 2110 07/08/12 2251   07/07/12 0030  clindamycin (CLEOCIN) IVPB 600 mg     600 mg 100 mL/hr over 30 Minutes Intravenous 3 times per day 07/07/12 0019     07/06/12 2200  linezolid (ZYVOX) IVPB 600 mg  Status:  Discontinued     600 mg 300 mL/hr over 60 Minutes Intravenous Every 12 hours 07/06/12 1612 07/06/12 2050   07/06/12 2200  piperacillin-tazobactam (ZOSYN) IVPB 3.375 g  Status:  Discontinued     3.375 g 12.5 mL/hr over 240 Minutes Intravenous 3 times per day 07/06/12 1646 07/11/12 0751   07/06/12 2115  vancomycin (VANCOCIN) 2,000 mg in sodium chloride 0.9 % 500 mL IVPB     2,000 mg 250 mL/hr over 120 Minutes Intravenous  Once 07/06/12 2110 07/07/12 0042   07/06/12 2100  vancomycin (VANCOCIN) IVPB 1000 mg/200 mL premix  Status:  Discontinued     1,000 mg 200 mL/hr over 60 Minutes Intravenous  Once 07/06/12 2050 07/06/12 2109   07/06/12 1830  piperacillin-tazobactam (ZOSYN) IVPB 3.375 g  Status:  Discontinued     3.375 g 100 mL/hr over 30 Minutes Intravenous 4 times per day 07/06/12 1825 07/06/12 1858   07/06/12 1830  vancomycin (VANCOCIN) IVPB 1000 mg/200 mL premix  Status:  Discontinued     1,000 mg 200 mL/hr over 60 Minutes Intravenous Every 12 hours 07/06/12 1825 07/06/12 1920   07/06/12 1615  linezolid (ZYVOX) IVPB 600 mg  Status:  Discontinued     600 mg 300 mL/hr over 60 Minutes Intravenous Every 12 hours 07/06/12 1610 07/06/12 1619   07/06/12 1300  piperacillin-tazobactam (ZOSYN) IVPB 3.375 g     3.375 g 100 mL/hr over 30 Minutes Intravenous  Once 07/06/12 1250 07/06/12 1450   07/06/12 1300  clindamycin (CLEOCIN) IVPB 600 mg     600 mg 100 mL/hr over 30 Minutes Intravenous  Once 07/06/12 1253 07/06/12 1352   07/06/12 1130  vancomycin (VANCOCIN) IVPB 1000 mg/200 mL premix     1,000 mg 200 mL/hr over 60 Minutes Intravenous  Once 07/06/12 1117 07/06/12 1230      Assessment/Plan: s/p Procedure(s): DRESSING CHANGE, THIGH WOUND (Left)  Continue current care Wound care teams treatment and opinion appreciated.  Will continue current course for now as she continues to improve and tolerate local wound care at bedside.  Will try to hold off on operative debridement for a few more days to let more  tissue declare itself and hopefully avoid multiple OR trips.  LOS: 6 days    [name] [name] A 07/12/2012

## 2012-07-12 NOTE — Progress Notes (Signed)
Nursing 0600 - Call attending on call, notified of lab results...hemoglobin down to 6.8 from 7.1 yesterday.  Will evaluate per MD.

## 2012-07-12 NOTE — Progress Notes (Signed)
PT Hydrotherapy Note  07/12/12 1542  Subjective Assessment  Subjective This is so awkward  Wound 07/06/12 Thigh Left large abscess, open, draining serosanguinous fluid  Date First Assessed/Time First Assessed: 07/06/12 1200   Location: Thigh  Location Orientation: Left  Wound Description (Comments): large abscess, open, draining serosanguinous fluid  Present on Admission: Yes  Site / Wound Assessment Yellow  % Wound base Red or Granulating 0%  % Wound base Yellow 100%  % Wound base Black 0%  % Wound base Other (Comment) 0%  Peri-wound Assessment Denuded;Erythema (blanchable);Pink  Drainage Amount Minimal  Drainage Description Serosanguineous  Treatment Hydrotherapy (Pulse lavage)  Dressing Type Gauze (Comment)  Dressing Changed Changed  Dressing Status Clean;Intact  Wound 07/06/12 Perineum Medial Open abscess  Date First Assessed/Time First Assessed: 07/06/12 1200   Location: Perineum  Location Orientation: Medial  Wound Description (Comments): Open abscess  Present on Admission: Yes  Site / Wound Assessment Black;Granulation tissue;Pink;Yellow  % Wound base Red or Granulating 50%  % Wound base Yellow 20%  % Wound base Black 20%  % Wound base Other (Comment) 10% (subcutaneous fatty tissue)  Peri-wound Assessment Erythema (blanchable);Maceration;Pink  Margins Unattacted edges (unapproximated)  Closure None  Drainage Amount Moderate  Drainage Description Serosanguineous  Treatment Hydrotherapy (Pulse lavage);Packing (Saline gauze)  Dressing Type ABD;Barrier Film (skin prep);Gauze (Comment);Moist to dry  Dressing Changed Changed  Dressing Status Clean;Intact  Hydrotherapy  Pulsed Lavage with Suction (psi) 4 psi  Pulsed Lavage with Suction - Normal Saline Used 1000 mL  Pulsed Lavage Tip Tip with splash shield  Pulsed lavage therapy - wound location perinieum; and thigh wounds  Wound Therapy - Assess/Plan/Recommendations  Wound Therapy - Clinical Statement Wound appeared much  cleaner today, more granulation tissue.  Patient will benefit from continued hydrotherapy to cleanse wounds and promote healing.  Santyl continued to assist with debridement.  Wound Therapy - Functional Problem List decreased mobility; increased pain, decreased skin itegrity  Factors Delaying/Impairing Wound Healing Altered sensation;Infection - systemic/local;Immobility;Vascular compromise  Hydrotherapy Plan Debridement;Dressing change;Patient/family education;Pulsatile lavage with suction  Wound Therapy - Frequency 6X / week  Wound Therapy - Follow Up Recommendations Home health RN  Wound Plan continue to treat as indicated  Wound Therapy Goals - Improve the function of patient's integumentary system by progressing the wound(s) through the phases of wound healing by:  Decrease Necrotic Tissue - Progress Progressing toward goal  Increase Granulation Tissue - Progress Progressing toward goal  Patient/Family Instruction Goal - Progress Progressing toward goal   07/12/2012 [facility], [place]

## 2012-07-12 NOTE — Progress Notes (Signed)
Peripherally Inserted Central Catheter/Midline Placement  The IV Nurse has discussed with the patient and/or persons authorized to consent for the patient, the purpose of this procedure and the potential benefits and risks involved with this procedure.  The benefits include less needle sticks, lab draws from the catheter and patient may be discharged home with the catheter.  Risks include, but not limited to, infection, bleeding, blood clot (thrombus formation), and puncture of an artery; nerve damage and irregular heat beat.  Alternatives to this procedure were also discussed.  PICC/Midline Placement Documentation        [name] [name] 07/12/2012, 11:31 AM

## 2012-07-13 LAB — BASIC METABOLIC PANEL
Calcium: 8.7 mg/dL (ref 8.4–10.5)
GFR calc non Af Amer: 33 mL/min — ABNORMAL LOW (ref >90)
Potassium: 4 mEq/L (ref 3.5–5.1)
Sodium: 134 mEq/L — ABNORMAL LOW (ref 135–145)

## 2012-07-13 LAB — CBC
MCH: 26.1 pg (ref 26.0–34.0)
MCHC: 33.8 g/dL (ref 30.0–36.0)
Platelets: 226 10*3/uL (ref 150–400)
RBC: 3.06 MIL/uL — ABNORMAL LOW (ref 3.87–5.11)

## 2012-07-13 LAB — GLUCOSE, CAPILLARY
Glucose-Capillary: 114 mg/dL — ABNORMAL HIGH (ref 70–99)
Glucose-Capillary: 82 mg/dL (ref 70–99)

## 2012-07-13 MED ORDER — ENOXAPARIN SODIUM 40 MG/0.4ML ~~LOC~~ SOLN: 40.0000 mg | SUBCUTANEOUS | Status: DC

## 2012-07-13 MED ORDER — SODIUM CHLORIDE 0.9 % IV BOLUS (SEPSIS)
1000.0000 mL | Freq: Once | INTRAVENOUS | Status: AC
  Administered 2012-07-13: 1000 mL via INTRAVENOUS

## 2012-07-13 NOTE — Progress Notes (Signed)
Subjective: Pt remains afebrile. WBCs are slightly increasing today will continue to monitor. Pt still having good pain control. WOC still debriding concern for perineum increased necrotic tissue. Pt eating well and established PICC access yesterday.   Objective: Vital signs in last 24 hours: Filed Vitals:   07/12/12 1620 07/12/12 2015 07/13/12 0020 07/13/12 0305  BP: 138/72 130/74 137/78 142/82  Pulse:   94 102  Temp: 98.1 F (36.7 C) 98.3 F (36.8 C) 98.2 F (36.8 C) 99.6 F (37.6 C)  TempSrc: Oral Oral Oral Oral  Resp:  17 19 15   Height:      Weight:      SpO2: 100%  98% 98%   Weight change:   Intake/Output Summary (Last 24 hours) at 07/13/12 [id number] Last data filed at 07/13/12 0600  Gross per 24 hour  Intake   5068 ml  Output    851 ml  Net   4217 ml   General: resting in bed, arousable, NAD HEENT: PERRL, EOMI, no scleral icterus Cardiac: RRR, no rubs, murmurs or gallops Pulm: clear to auscultation bilaterally, moving normal volumes of air Abd: soft, nontender, nondistended, BS present Ext: warm and well perfused, no pedal edema, scattered pustules over face and UE all different stages of healing, left groin dressing from medial thigh to left labial draining serosanginous fluid, nttp, no crepitus, but increasing induration around bandage borders Neuro: alert and oriented X3, cranial nerves II-XII grossly intact, depressed affect  Lab Results: Basic Metabolic Panel:  Recent Labs Lab 07/06/12 2203  07/08/12 0450  07/12/12 0445 07/13/12 0420  NA 131*  < > 126*  < > 137 134*  K 4.1  < > 4.2  < > 4.3 4.0  CL 96  < > 97  < > 108 106  CO2 19  < > 19  < > 18* 16*  GLUCOSE 333*  < > 319*  < > 175* 146*  BUN 14  < > 25*  < > 25* 23  CREATININE 0.71  < > 2.15*  < > 2.10* 1.91*  CALCIUM 9.2  < > 8.5  < > 8.4 8.7  MG 1.9  --  2.2  --   --   --   PHOS  --   --  4.6  --   --   --   < > = values in this interval not displayed. Liver Function Tests:  Recent Labs Lab  07/06/12 2203  AST 12  ALT 13  ALKPHOS 165*  BILITOT 0.8  PROT 7.0  ALBUMIN 1.7*   CBC:  Recent Labs Lab 07/06/12 1042  07/09/12 0440  07/12/12 2136 07/13/12 0420  WBC 10.3  < > 11.3*  < > 13.5* 14.5*  NEUTROABS 7.7  --  8.3*  --   --   --   HGB 10.4*  < > 7.1*  < > 7.2* 8.0*  HCT 31.1*  < > 21.6*  < > 21.8* 23.7*  MCV 76.6*  < > 77.4*  < > 78.7 77.5*  PLT 278  < > 200  < > 199 226  < > = values in this interval not displayed. CBG:  Recent Labs Lab 07/11/12 2142 07/12/12 0735 07/12/12 1254 07/12/12 1637 07/12/12 2138 07/13/12 0755  GLUCAP 253* 185* 152* 141* 137* 114*   Hemoglobin A1C:  Recent Labs Lab 07/06/12 2203  HGBA1C 15.4*   Urine Drug Screen: Drugs of Abuse     Component Value Date/Time   LABOPIA NONE DETECTED 04/11/2011  [place] 04/11/2011 1656   LABBENZ NONE DETECTED 04/11/2011 1656   AMPHETMU NONE DETECTED 04/11/2011 1656   THCU NONE DETECTED 04/11/2011 1656   LABBARB NONE DETECTED 04/11/2011 1656    Urinalysis:  Recent Labs Lab 07/06/12 1200 07/09/12 0345  COLORURINE YELLOW AMBER*  LABSPEC 1.024 1.017  PHURINE 5.0 5.0  GLUCOSEU >1000* 250*  HGBUR SMALL* LARGE*  BILIRUBINUR MODERATE* NEGATIVE  KETONESUR >80* NEGATIVE  PROTEINUR 100* NEGATIVE  UROBILINOGEN 1.0 1.0  NITRITE NEGATIVE NEGATIVE  LEUKOCYTESUR NEGATIVE TRACE*   Micro Results: Recent Results (from the past 240 hour(s))  CULTURE, BLOOD (ROUTINE X 2)     Status: None   Collection Time    07/06/12 10:50 AM      Result Value Range Status   Specimen Description BLOOD LEFT ANTECUBITAL   Final   Special Requests BOTTLES DRAWN AEROBIC ONLY 10CC   Final   Culture  Setup Time 07/06/2012 18:19   Final   Culture NO GROWTH 5 DAYS   Final   Report Status 07/12/2012 FINAL   Final  CULTURE, BLOOD (ROUTINE X 2)     Status: None   Collection Time    07/06/12 11:00 AM      Result Value Range Status   Specimen Description BLOOD RIGHT ANTECUBITAL   Final    Special Requests BOTTLES DRAWN AEROBIC ONLY 2CC   Final   Culture  Setup Time 07/06/2012 18:19   Final   Culture NO GROWTH 5 DAYS   Final   Report Status 07/12/2012 FINAL   Final  WOUND CULTURE     Status: None   Collection Time    07/06/12  3:11 PM      Result Value Range Status   Specimen Description WOUND THIGH LEFT   Final   Special Requests PT ON ZOSYN,VANCOMYCIN AND CLEOCIN   Final   Gram Stain     Final   Value: RARE WBC PRESENT, PREDOMINANTLY PMN     NO SQUAMOUS EPITHELIAL CELLS SEEN     ABUNDANT GRAM POSITIVE RODS     FEW GRAM POSITIVE COCCI     IN PAIRS IN CLUSTERS   Culture     Final   Value: RARE METHICILLIN RESISTANT STAPHYLOCOCCUS AUREUS     Note: RIFAMPIN AND GENTAMICIN SHOULD NOT BE USED AS SINGLE DRUGS FOR TREATMENT OF STAPH INFECTIONS. This organism DOES NOT demonstrate inducible Clindamycin resistance in vitro. CRITICAL RESULT CALLED TO, READ BACK BY AND VERIFIED WITH: ROBIN WOFFORD BY      INGRAM A 2/18 8AM   Report Status 07/10/2012 FINAL   Final   Organism ID, Bacteria METHICILLIN RESISTANT STAPHYLOCOCCUS AUREUS   Final  ANAEROBIC CULTURE     Status: None   Collection Time    07/06/12  3:11 PM      Result Value Range Status   Specimen Description WOUND THIGH LEFT   Final   Special Requests PT ON ZOSYN,VANCOMYCIN AND CLEOCIN   Final   Gram Stain     Final   Value: RARE WBC PRESENT, PREDOMINANTLY PMN     NO SQUAMOUS EPITHELIAL CELLS SEEN     ABUNDANT GRAM POSITIVE RODS     FEW GRAM POSITIVE COCCI IN PAIRS     IN CLUSTERS   Culture NO ANAEROBES ISOLATED   Final   Report Status 07/11/2012 FINAL   Final  MRSA PCR SCREENING     Status: Abnormal   Collection Time    07/06/12  8:12 PM  Result Value Range Status   MRSA by PCR POSITIVE (*) NEGATIVE Final   Comment:            The GeneXpert MRSA Assay (FDA     approved for NASAL specimens     only), is one component of a     comprehensive MRSA colonization     surveillance program. It is not     intended  to diagnose MRSA     infection nor to guide or     monitor treatment for     MRSA infections.     RESULT CALLED TO, READ BACK BY AND VERIFIED WITH:     S COGGINS RN 2.14.14 AT 2215 BY ROMEROJ   Studies/Results: No results found. Medications: I have reviewed the patient's current medications. Scheduled Meds: . amitriptyline  50 mg Oral QHS  . bd getting started take home kit  1 kit Other Once  . Chlorhexidine Gluconate Cloth  6 each Topical Q0600  . clindamycin (CLEOCIN) IV  600 mg Intravenous Q8H  . collagenase   Topical Daily  . enoxaparin (LOVENOX) injection  40 mg Subcutaneous Q24H  . FLUoxetine  40 mg Oral Daily  . insulin aspart  0-20 Units Subcutaneous TID WC  . insulin aspart  0-5 Units Subcutaneous QHS  . insulin aspart  6 Units Subcutaneous TID WC  . insulin glargine  34 Units Subcutaneous QHS  . mupirocin ointment  1 application Nasal BID  . sodium chloride  10-40 mL Intracatheter Q12H   Continuous Infusions: . sodium chloride 100 mL/hr at 07/13/12 0310   PRN Meds:.sodium chloride, HYDROcodone-acetaminophen, HYDROmorphone (DILAUDID) injection, ondansetron (ZOFRAN) IV, sodium chloride Assessment/Plan: 1. Necrotizing fascitis: Pt was admitted on 2/14 and taken emergently to OR for surgical debridement of site suspicious for nec fasc. Over L groin. Pt was then immediately started on Vanc/zosyn/clinda. Repeat debridement of necrotic tissue on 2/15, 2/16, 2/17. Minimal blood loss. Wound and tissue cultures pending. Tissue culture MRSA R PCN/erythromycin and D-test negative. WOC concered that may need another debridement.  -continue Clindamycin -surgery following and recommend WOC to attend to dressing changes  2. ATN: Pt Cr 1.9 (downtrending and responding to fluids)  today from presumed baseline of 0.7 on admission. UA shows waxy/granular casts and uric acid crystals along with hematuria (RBCs 21-50). Some of hematuria maybe 2/2 traumatic foley insertion.  -NS bolus again  today now that have established access -continue foley -repeat Bmet  3. Acute Anemia: Pt baseline Hgb 10 and today 6.8 yesterday pt received 1 PRBCs post-transfusion 7.2. Review of intraoperative notes doesn't appear to be etiology of bleeding. No active sites of bleeding. Post transfusion CBC.  -monitor CBC -transfuse <7.0  4. Poorly controlled DM type 2: Hgb A1c 15.4 and outpt meds include metformin and glipizide. Pt initially presented in DKA on 2/14 with AG of 24 today AG 12. No ketones in urine.  -cont SSI -carb modified -will need to manage total insulin requirement -DM educator   Dispo: Disposition is deferred at this time, awaiting improvement of current medical problems.  Anticipated discharge in approximately 3-5 day(s).   The patient does have a current PCP (PRIBULA,CHRISTOPHER, MD), therefore will be requiring OPC follow-up after discharge.   The patient does not have transportation limitations that hinder transportation to clinic appointments.  .Services Needed at time of discharge: Y = Yes, Blank = No PT:   OT:   RN:   Equipment:   Other:     LOS: 7 days  Clinton Gallant 07/13/2012, 8:28 AM [phone]

## 2012-07-13 NOTE — Progress Notes (Signed)
Patient ID: [name] [name], female   DOB: January 17, 1976, 37 y.o.   MRN: [id number] 4 Days Post-Op  Subjective: Pt c/o more pain today.  Has really good questions.  Objective: Vital signs in last 24 hours: Temp:  [97.9 F (36.6 C)-99.6 F (37.6 C)] 99.6 F (37.6 C) (02/21 0305) Pulse Rate:  [94-102] 102 (02/21 0305) Resp:  [15-19] 15 (02/21 0305) BP: (91-142)/(51-82) 142/82 mmHg (02/21 0305) SpO2:  [98 %-100 %] 98 % (02/21 0305) Last BM Date: 07/06/12  Intake/Output from previous day: 02/20 0701 - 02/21 0700 In: 5168 [P.O.:680; I.V.:2800; Blood:138; IV Piggyback:1550] Out: 1801 [Urine:1800; Stool:1] Intake/Output this shift:    PE: Ext: left thigh with large wound, which is a combination of clean and some necrotic fat still.  Her small other satellite wounds are 100% necrotic fat.  The top of her thigh and her left mons pubis both has some central necrosis with purulent drainage along with warmth and induration.  Lab Results:   Recent Labs  07/12/12 2136 07/13/12 0420  WBC 13.5* 14.5*  HGB 7.2* 8.0*  HCT 21.8* 23.7*  PLT 199 226   BMET  Recent Labs  07/12/12 0445 07/13/12 0420  NA 137 134*  K 4.3 4.0  CL 108 106  CO2 18* 16*  GLUCOSE 175* 146*  BUN 25* 23  CREATININE 2.10* 1.91*  CALCIUM 8.4 8.7   PT/INR No results found for this basename: LABPROT, INR,  in the last 72 hours CMP     Component Value Date/Time   NA 134* 07/13/2012 0420   K 4.0 07/13/2012 0420   CL 106 07/13/2012 0420   CO2 16* 07/13/2012 0420   GLUCOSE 146* 07/13/2012 0420   BUN 23 07/13/2012 0420   CREATININE 1.91* 07/13/2012 0420   CALCIUM 8.7 07/13/2012 0420   PROT 7.0 07/06/2012 2203   ALBUMIN 1.7* 07/06/2012 2203   AST 12 07/06/2012 2203   ALT 13 07/06/2012 2203   ALKPHOS 165* 07/06/2012 2203   BILITOT 0.8 07/06/2012 2203   GFRNONAA 33* 07/13/2012 0420   GFRAA 38* 07/13/2012 0420   Lipase  No results found for this basename: lipase       Studies/Results: No results  found.  Anti-infectives: Anti-infectives   Start     Dose/Rate Route Frequency Ordered Stop   07/09/12 1800  vancomycin (VANCOCIN) 2,000 mg in sodium chloride 0.9 % 500 mL IVPB  Status:  Discontinued     2,000 mg 250 mL/hr over 120 Minutes Intravenous Every 24 hours 07/08/12 2308 07/10/12 1132   07/07/12 1000  vancomycin (VANCOCIN) 1,250 mg in sodium chloride 0.9 % 250 mL IVPB  Status:  Discontinued     1,250 mg 166.7 mL/hr over 90 Minutes Intravenous Every 12 hours 07/06/12 2110 07/08/12 2251   07/07/12 0030  clindamycin (CLEOCIN) IVPB 600 mg     600 mg 100 mL/hr over 30 Minutes Intravenous 3 times per day 07/07/12 0019     07/06/12 2200  linezolid (ZYVOX) IVPB 600 mg  Status:  Discontinued     600 mg 300 mL/hr over 60 Minutes Intravenous Every 12 hours 07/06/12 1612 07/06/12 2050   07/06/12 2200  piperacillin-tazobactam (ZOSYN) IVPB 3.375 g  Status:  Discontinued     3.375 g 12.5 mL/hr over 240 Minutes Intravenous 3 times per day 07/06/12 1646 07/11/12 0751   07/06/12 2115  vancomycin (VANCOCIN) 2,000 mg in sodium chloride 0.9 % 500 mL IVPB     2,000 mg 250 mL/hr over 120 Minutes Intravenous  Once 07/06/12 2110 07/07/12 0042   07/06/12 2100  vancomycin (VANCOCIN) IVPB 1000 mg/200 mL premix  Status:  Discontinued     1,000 mg 200 mL/hr over 60 Minutes Intravenous  Once 07/06/12 2050 07/06/12 2109   07/06/12 1830  piperacillin-tazobactam (ZOSYN) IVPB 3.375 g  Status:  Discontinued     3.375 g 100 mL/hr over 30 Minutes Intravenous 4 times per day 07/06/12 1825 07/06/12 1858   07/06/12 1830  vancomycin (VANCOCIN) IVPB 1000 mg/200 mL premix  Status:  Discontinued     1,000 mg 200 mL/hr over 60 Minutes Intravenous Every 12 hours 07/06/12 1825 07/06/12 1920   07/06/12 1615  linezolid (ZYVOX) IVPB 600 mg  Status:  Discontinued     600 mg 300 mL/hr over 60 Minutes Intravenous Every 12 hours 07/06/12 1610 07/06/12 1619   07/06/12 1300  piperacillin-tazobactam (ZOSYN) IVPB 3.375 g      3.375 g 100 mL/hr over 30 Minutes Intravenous  Once 07/06/12 1250 07/06/12 1450   07/06/12 1300  clindamycin (CLEOCIN) IVPB 600 mg     600 mg 100 mL/hr over 30 Minutes Intravenous  Once 07/06/12 1253 07/06/12 1352   07/06/12 1130  vancomycin (VANCOCIN) IVPB 1000 mg/200 mL premix     1,000 mg 200 mL/hr over 60 Minutes Intravenous  Once 07/06/12 1117 07/06/12 1230       Assessment/Plan  1. Left thigh/groin abscess and cellulitis, s/p multiple debridements. 2. DM  Plan: 1. Patient has further infection of her left thigh and and left mons pubis with purulent drainage able to be expressed today.  She will need to return to the OR tomorrow for more debridement.   2. NPO after MN tonight.  Consent written for and Lovenox held for tomorrow.    LOS: 7 days    Dani Danis E 07/13/2012, 11:07 AM Pager: [phone]

## 2012-07-13 NOTE — Progress Notes (Signed)
PT/Hydrotherapy Note   07/13/12 1147  Subjective Assessment  Subjective patient expressing more pain today  Evaluation and Treatment  Evaluation and Treatment Procedures Explained to Patient/Family Yes  Evaluation and Treatment Procedures agreed to  Wound 07/06/12 Thigh Left large abscess, open, draining serosanguinous fluid  Date First Assessed/Time First Assessed: 07/06/12 1200   Location: Thigh  Location Orientation: Left  Wound Description (Comments): large abscess, open, draining serosanguinous fluid  Present on Admission: Yes  Site / Wound Assessment Yellow  % Wound base Red or Granulating 0%  % Wound base Yellow 100%  % Wound base Black 0%  % Wound base Other (Comment) 0%  Peri-wound Assessment Denuded;Erythema (blanchable);Pink  Margins Unattacted edges (unapproximated)  Drainage Amount Minimal  Drainage Description Serosanguineous  Treatment Hydrotherapy (Pulse lavage)  Dressing Type Gauze (Comment)  Dressing Changed Changed  Dressing Status Clean;Intact  Wound 07/06/12 Perineum Medial Open abscess  Date First Assessed/Time First Assessed: 07/06/12 1200   Location: Perineum  Location Orientation: Medial  Wound Description (Comments): Open abscess  Present on Admission: Yes  Site / Wound Assessment Yellow;Granulation tissue;Pink;Black  % Wound base Red or Granulating 60%  % Wound base Yellow 20%  % Wound base Black 10%  % Wound base Other (Comment) 10% (subcutaneous fatty tissue)  Peri-wound Assessment Erythema (blanchable);Maceration;Pink  Margins Unattacted edges (unapproximated)  Closure None  Drainage Amount Minimal  Drainage Description Serosanguineous  Treatment Hydrotherapy (Pulse lavage);Packing (Saline gauze)  Dressing Type ABD;Barrier Film (skin prep);Gauze (Comment);Moist to dry (santyl)  Dressing Changed Changed  Dressing Status Clean;Intact  Hydrotherapy  Pulsed Lavage with Suction (psi) 4 psi  Pulsed Lavage with Suction - Normal Saline Used 1000 mL   Pulsed Lavage Tip Tip with splash shield  Pulsed lavage therapy - wound location perinieum; and thigh wounds  Wound Therapy - Assess/Plan/Recommendations  Wound Therapy - Clinical Statement Wound with very little change from yesterday, slightly more granulation tissue in large wound.  Claiborne Billings, PT in to assess wound during treatment.  Patient with what appears to be starting wounds on upper thigh and mons.  Patient will benefit from continued hydrotherapy to cleanse wounds and promote healing.  Santyl continued to promote debridement.  If patient goes to surgery tomorrow, will defer hydro and resume on Monday.  Wound Therapy - Functional Problem List decreased mobility; increased pain, decreased skin itegrity  Factors Delaying/Impairing Wound Healing Altered sensation;Infection - systemic/local;Immobility;Vascular compromise  Hydrotherapy Plan Debridement;Dressing change;Patient/family education;Pulsatile lavage with suction  Wound Therapy - Frequency 6X / week  Wound Therapy - Follow Up Recommendations Home health RN  Wound Plan continue to treat as indicated  Wound Therapy Goals - Improve the function of patient's integumentary system by progressing the wound(s) through the phases of wound healing by:  Decrease Necrotic Tissue - Progress Progressing toward goal  Increase Granulation Tissue - Progress Progressing toward goal   07/13/2012 [facility]

## 2012-07-13 NOTE — Progress Notes (Signed)
I have seen and examined the patient and agree with the assessment and plans. Had hoped to hold off for a few more days, but now will need to go to [employer]. Ninfa Linden  MD, FACS

## 2012-07-14 ENCOUNTER — Encounter (HOSPITAL_COMMUNITY): Payer: Self-pay | Admitting: Anesthesiology

## 2012-07-14 ENCOUNTER — Inpatient Hospital Stay (HOSPITAL_COMMUNITY): Payer: Self-pay | Admitting: Anesthesiology

## 2012-07-14 ENCOUNTER — Encounter (HOSPITAL_COMMUNITY)
Admission: EM | Disposition: A | Discharge status: Nursing Home (Includes ICF) | Payer: Self-pay | Source: Home / Self Care | Attending: Internal Medicine

## 2012-07-14 DIAGNOSIS — K219 Gastro-esophageal reflux disease without esophagitis: Secondary | ICD-10-CM

## 2012-07-14 DIAGNOSIS — G8918 Other acute postprocedural pain: Secondary | ICD-10-CM

## 2012-07-14 DIAGNOSIS — N170 Acute kidney failure with tubular necrosis: Secondary | ICD-10-CM

## 2012-07-14 HISTORY — PX: INCISION AND DRAINAGE PERIRECTAL ABSCESS: SHX1804

## 2012-07-14 LAB — CBC
Hemoglobin: 7.3 g/dL — ABNORMAL LOW (ref 12.0–15.0)
MCHC: 33.8 g/dL (ref 30.0–36.0)
RBC: 2.78 MIL/uL — ABNORMAL LOW (ref 3.87–5.11)
WBC: 11.5 10*3/uL — ABNORMAL HIGH (ref 4.0–10.5)

## 2012-07-14 LAB — BASIC METABOLIC PANEL
BUN: 22 mg/dL (ref 6–23)
CO2: 18 mEq/L — ABNORMAL LOW (ref 19–32)
Chloride: 107 mEq/L (ref 96–112)
Creatinine, Ser: 1.84 mg/dL — ABNORMAL HIGH (ref 0.50–1.10)

## 2012-07-14 LAB — GLUCOSE, CAPILLARY
Glucose-Capillary: 85 mg/dL (ref 70–99)
Glucose-Capillary: 136 mg/dL — ABNORMAL HIGH (ref 70–99)

## 2012-07-14 SURGERY — INCISION AND DRAINAGE, ABSCESS, PERIRECTAL
Anesthesia: General | Site: Groin | Laterality: Left | Wound class: Dirty or Infected

## 2012-07-14 MED ORDER — HYDROMORPHONE HCL PF 1 MG/ML IJ SOLN
INTRAMUSCULAR | Status: AC
  Administered 2012-07-14: 0.5 mg via INTRAVENOUS
  Filled 2012-07-14: qty 1

## 2012-07-14 MED ORDER — FENTANYL CITRATE 0.05 MG/ML IJ SOLN
INTRAMUSCULAR | Status: DC | PRN
  Administered 2012-07-14 (×5): 50 ug via INTRAVENOUS

## 2012-07-14 MED ORDER — HYDROMORPHONE HCL PF 1 MG/ML IJ SOLN
0.2500 mg | INTRAMUSCULAR | Status: DC | PRN
  Administered 2012-07-14: 0.5 mg via INTRAVENOUS

## 2012-07-14 MED ORDER — METOCLOPRAMIDE HCL 5 MG/ML IJ SOLN: 10.0000 mg | Freq: Once | INTRAMUSCULAR | Status: DC | PRN

## 2012-07-14 MED ORDER — OXYCODONE HCL 5 MG PO TABS: 5.0000 mg | ORAL_TABLET | Freq: Once | ORAL | Status: AC | PRN

## 2012-07-14 MED ORDER — ONDANSETRON HCL 4 MG/2ML IJ SOLN
INTRAMUSCULAR | Status: DC | PRN
  Administered 2012-07-14: 4 mg via INTRAVENOUS

## 2012-07-14 MED ORDER — OXYCODONE HCL 5 MG/5ML PO SOLN: 5.0000 mg | Freq: Once | ORAL | Status: DC | PRN

## 2012-07-14 MED ORDER — MIDAZOLAM HCL 5 MG/5ML IJ SOLN
INTRAMUSCULAR | Status: DC | PRN
  Administered 2012-07-14: 2 mg via INTRAVENOUS

## 2012-07-14 MED ORDER — PROPOFOL 10 MG/ML IV BOLUS
INTRAVENOUS | Status: DC | PRN
  Administered 2012-07-14: 200 mg via INTRAVENOUS

## 2012-07-14 MED ORDER — ENOXAPARIN SODIUM 40 MG/0.4ML ~~LOC~~ SOLN
40.0000 mg | SUBCUTANEOUS | Status: DC
  Filled 2012-07-14: qty 0.4

## 2012-07-14 MED ORDER — 0.9 % SODIUM CHLORIDE (POUR BTL) OPTIME
TOPICAL | Status: DC | PRN
  Administered 2012-07-14: 1000 mL

## 2012-07-14 MED ORDER — SUCCINYLCHOLINE CHLORIDE 20 MG/ML IJ SOLN
INTRAMUSCULAR | Status: DC | PRN
  Administered 2012-07-14: 140 mg via INTRAVENOUS

## 2012-07-14 MED ORDER — LACTATED RINGERS IV SOLN
INTRAVENOUS | Status: DC | PRN
  Administered 2012-07-14: 10:00:00 via INTRAVENOUS

## 2012-07-14 SURGICAL SUPPLY — 37 items
BLADE SURG 15 STRL LF DISP TIS (BLADE) ×1 IMPLANT
BLADE SURG 15 STRL SS (BLADE)
CANISTER SUCTION 2500CC (MISCELLANEOUS) ×3 IMPLANT
CLEANER TIP ELECTROSURG 2X2 (MISCELLANEOUS) IMPLANT
CLOTH BEACON ORANGE TIMEOUT ST (SAFETY) ×3 IMPLANT
COVER SURGICAL LIGHT HANDLE (MISCELLANEOUS) ×3 IMPLANT
DRAPE UTILITY 15X26 W/TAPE STR (DRAPE) ×2 IMPLANT
DRSG PAD ABDOMINAL 8X10 ST (GAUZE/BANDAGES/DRESSINGS) ×3 IMPLANT
ELECT REM PT RETURN 9FT ADLT (ELECTROSURGICAL) ×3
ELECTRODE REM PT RTRN 9FT ADLT (ELECTROSURGICAL) ×1 IMPLANT
GAUZE PACKING FOLDED 1/2 STR (GAUZE/BANDAGES/DRESSINGS) ×2 IMPLANT
GAUZE PACKING IODOFORM 1 (PACKING) IMPLANT
GAUZE SPONGE 4X4 16PLY XRAY LF (GAUZE/BANDAGES/DRESSINGS) ×1 IMPLANT
GLOVE BIOGEL M STRL SZ7.5 (GLOVE) ×3 IMPLANT
GLOVE BIOGEL PI IND STRL 8 (GLOVE) ×4 IMPLANT
GLOVE BIOGEL PI INDICATOR 8 (GLOVE) ×2
GOWN PREVENTION PLUS XLARGE (GOWN DISPOSABLE) ×3 IMPLANT
GOWN SRG XL XLNG 56XLVL 4 (GOWN DISPOSABLE) ×1 IMPLANT
GOWN STRL NON-REIN LRG LVL3 (GOWN DISPOSABLE) ×3 IMPLANT
GOWN STRL NON-REIN XL XLG LVL4 (GOWN DISPOSABLE) ×3
KIT BASIN OR (CUSTOM PROCEDURE TRAY) ×3 IMPLANT
KIT ROOM TURNOVER OR (KITS) ×6 IMPLANT
NS IRRIG 1000ML POUR BTL (IV SOLUTION) ×3 IMPLANT
PACK GENERAL/GYN (CUSTOM PROCEDURE TRAY) ×2 IMPLANT
PACK LITHOTOMY IV (CUSTOM PROCEDURE TRAY) ×3 IMPLANT
PAD ARMBOARD 7.5X6 YLW CONV (MISCELLANEOUS) ×6 IMPLANT
PENCIL BUTTON HOLSTER BLD 10FT (ELECTRODE) IMPLANT
SPONGE GAUZE 4X4 12PLY (GAUZE/BANDAGES/DRESSINGS) ×3 IMPLANT
SWAB COLLECTION DEVICE MRSA (MISCELLANEOUS) ×1 IMPLANT
TAPE CLOTH SURG 4X10 WHT LF (GAUZE/BANDAGES/DRESSINGS) ×2 IMPLANT
TOWEL OR 17X24 6PK STRL BLUE (TOWEL DISPOSABLE) ×3 IMPLANT
TOWEL OR 17X26 10 PK STRL BLUE (TOWEL DISPOSABLE) ×3 IMPLANT
TUBE ANAEROBIC SPECIMEN COL (MISCELLANEOUS) ×1 IMPLANT
TUBE CONNECTING 12X1/4 (SUCTIONS) ×1 IMPLANT
UNDERPAD 30X30 INCONTINENT (UNDERPADS AND DIAPERS) ×3 IMPLANT
WATER STERILE IRR 1000ML POUR (IV SOLUTION) ×1 IMPLANT
YANKAUER SUCT BULB TIP NO VENT (SUCTIONS) ×1 IMPLANT

## 2012-07-14 NOTE — Brief Op Note (Signed)
07/06/2012 - 07/14/2012  11:52 AM  PATIENT:  [name] [name]  37 y.o. female  PRE-OPERATIVE DIAGNOSIS:  on-going necrosis of left thigh, mons pubis and perineum  POST-OPERATIVE DIAGNOSIS:  same  PROCEDURE:  Procedure(s): IRRIGATION AND DEBRIDEMENT OF MONS PUBIS, PERINEUM  (Left) Left thigh  SURGEON:  Surgeon(s) and Role:    * Gayland Curry, MD,FACS - Primary  PHYSICIAN ASSISTANT:   ASSISTANTS: none   ANESTHESIA:   general  EBL:  Total I/O In: -  Out: [id number] [Urine:1290; Blood:75]  BLOOD ADMINISTERED:none  DRAINS: Urinary Catheter (Foley)   LOCAL MEDICATIONS USED:  NONE  SPECIMEN:  Source of Specimen:  skin and soft tissue of mons, prox L thigh  DISPOSITION OF SPECIMEN:  PATHOLOGY  COUNTS:  YES  TOURNIQUET:  * No tourniquets in log *  DICTATION: .Other Dictation: Dictation Number [id number]  PLAN OF CARE: PACU then SDU  PATIENT DISPOSITION:  PACU - hemodynamically stable.   Delay start of Pharmacological VTE agent (>24hrs) due to surgical blood loss or risk of bleeding: no  [name]. Redmond Pulling, MD, FACS General, Bariatric, & Minimally Invasive Surgery [facility], [place]

## 2012-07-14 NOTE — Progress Notes (Signed)
PT Cancellation Note  Patient Details Name: [name] [name] MRN: [id number] DOB: 02/28/1976   Cancelled Treatment:  Holding Hydrotherapy today due to pt in OR for surgical intervention. Will followup on Monday.  Willow Ora 07/14/2012, 12:15 PM  Willow Ora, PTA Office- [phone]

## 2012-07-14 NOTE — Progress Notes (Signed)
Subjective: Pt remains afebrile. Pt having some increased pain in thigh and vaginal area. Hgb stable this AM and Cr continues to trend down today. Pt set for debridement in OR today discussed with surgery. Pain control on oral vicodin and IV dilaudid.  Objective: Vital signs in last 24 hours: Filed Vitals:   07/13/12 2349 07/14/12 0000 07/14/12 0419 07/14/12 0742  BP: 112/72  121/81   Pulse:   95   Temp: 97.7 F (36.5 C)  97.8 F (36.6 C) 97.4 F (36.3 C)  TempSrc: Oral  Oral Oral  Resp: 20 25 20    Height:      Weight:      SpO2: 98%  97%    Weight change:   Intake/Output Summary (Last 24 hours) at 07/14/12 0851 Last data filed at 07/14/12 0742  Gross per 24 hour  Intake   1630 ml  Output   3350 ml  Net  -1720 ml   General: resting in bed, arousable, some distress HEENT: PERRL, EOMI, no scleral icterus Cardiac: RRR, no rubs, murmurs or gallops Pulm: clear to auscultation bilaterally, moving normal volumes of air Abd: soft, nontender, nondistended, BS present Ext: warm and well perfused, no pedal edema, scattered pustules over face and UE all different stages of healing, left groin dressing from medial thigh to left labial draining serosanginous fluid, ttp, no crepitus, but increasing induration around bandage borders, foley with sediment but no gross hematuria Neuro: alert and oriented X3, cranial nerves II-XII grossly intact, depressed affect  Lab Results: Basic Metabolic Panel:  Recent Labs Lab 07/08/12 0450  07/13/12 0420 07/14/12 0400  NA 126*  < > 134* 135  K 4.2  < > 4.0 4.0  CL 97  < > 106 107  CO2 19  < > 16* 18*  GLUCOSE 319*  < > 146* 80  BUN 25*  < > 23 22  CREATININE 2.15*  < > 1.91* 1.84*  CALCIUM 8.5  < > 8.7 8.2*  MG 2.2  --   --   --   PHOS 4.6  --   --   --   < > = values in this interval not displayed.  CBC:  Recent Labs Lab 07/09/12 0440  07/13/12 0420 07/14/12 0400  WBC 11.3*  < > 14.5* 11.5*  NEUTROABS 8.3*  --   --   --   HGB 7.1*   < > 8.0* 7.3*  HCT 21.6*  < > 23.7* 21.6*  MCV 77.4*  < > 77.5* 77.7*  PLT 200  < > 226 238  < > = values in this interval not displayed. CBG:  Recent Labs Lab 07/12/12 2138 07/13/12 0755 07/13/12 1216 07/13/12 1551 07/13/12 2218 07/14/12 0739  GLUCAP 137* 114* 124* 141* 82 94   Hemoglobin A1C: No results found for this basename: HGBA1C,  in the last 168 hours Urine Drug Screen: Drugs of Abuse     Component Value Date/Time   LABOPIA NONE DETECTED 04/11/2011 [place] 04/11/2011 1656   LABBENZ NONE DETECTED 04/11/2011 1656   AMPHETMU NONE DETECTED 04/11/2011 1656   THCU NONE DETECTED 04/11/2011 1656   LABBARB NONE DETECTED 04/11/2011 1656    Urinalysis:  Recent Labs Lab 07/09/12 0345  COLORURINE AMBER*  LABSPEC 1.017  PHURINE 5.0  GLUCOSEU 250*  HGBUR LARGE*  BILIRUBINUR NEGATIVE  KETONESUR NEGATIVE  PROTEINUR NEGATIVE  UROBILINOGEN 1.0  NITRITE NEGATIVE  LEUKOCYTESUR TRACE*   Micro Results: Recent Results (from the past 240  hour(s))  CULTURE, BLOOD (ROUTINE X 2)     Status: None   Collection Time    07/06/12 10:50 AM      Result Value Range Status   Specimen Description BLOOD LEFT ANTECUBITAL   Final   Special Requests BOTTLES DRAWN AEROBIC ONLY 10CC   Final   Culture  Setup Time 07/06/2012 18:19   Final   Culture NO GROWTH 5 DAYS   Final   Report Status 07/12/2012 FINAL   Final  CULTURE, BLOOD (ROUTINE X 2)     Status: None   Collection Time    07/06/12 11:00 AM      Result Value Range Status   Specimen Description BLOOD RIGHT ANTECUBITAL   Final   Special Requests BOTTLES DRAWN AEROBIC ONLY 2CC   Final   Culture  Setup Time 07/06/2012 18:19   Final   Culture NO GROWTH 5 DAYS   Final   Report Status 07/12/2012 FINAL   Final  WOUND CULTURE     Status: None   Collection Time    07/06/12  3:11 PM      Result Value Range Status   Specimen Description WOUND THIGH LEFT   Final   Special Requests PT ON ZOSYN,VANCOMYCIN AND  CLEOCIN   Final   Gram Stain     Final   Value: RARE WBC PRESENT, PREDOMINANTLY PMN     NO SQUAMOUS EPITHELIAL CELLS SEEN     ABUNDANT GRAM POSITIVE RODS     FEW GRAM POSITIVE COCCI     IN PAIRS IN CLUSTERS   Culture     Final   Value: RARE METHICILLIN RESISTANT STAPHYLOCOCCUS AUREUS     Note: RIFAMPIN AND GENTAMICIN SHOULD NOT BE USED AS SINGLE DRUGS FOR TREATMENT OF STAPH INFECTIONS. This organism DOES NOT demonstrate inducible Clindamycin resistance in vitro. CRITICAL RESULT CALLED TO, READ BACK BY AND VERIFIED WITH: ROBIN WOFFORD BY      INGRAM A 2/18 8AM   Report Status 07/10/2012 FINAL   Final   Organism ID, Bacteria METHICILLIN RESISTANT STAPHYLOCOCCUS AUREUS   Final  ANAEROBIC CULTURE     Status: None   Collection Time    07/06/12  3:11 PM      Result Value Range Status   Specimen Description WOUND THIGH LEFT   Final   Special Requests PT ON ZOSYN,VANCOMYCIN AND CLEOCIN   Final   Gram Stain     Final   Value: RARE WBC PRESENT, PREDOMINANTLY PMN     NO SQUAMOUS EPITHELIAL CELLS SEEN     ABUNDANT GRAM POSITIVE RODS     FEW GRAM POSITIVE COCCI IN PAIRS     IN CLUSTERS   Culture NO ANAEROBES ISOLATED   Final   Report Status 07/11/2012 FINAL   Final  MRSA PCR SCREENING     Status: Abnormal   Collection Time    07/06/12  8:12 PM      Result Value Range Status   MRSA by PCR POSITIVE (*) NEGATIVE Final   Comment:            The GeneXpert MRSA Assay (FDA     approved for NASAL specimens     only), is one component of a     comprehensive MRSA colonization     surveillance program. It is not     intended to diagnose MRSA     infection nor to guide or     monitor treatment for     MRSA infections.  RESULT CALLED TO, READ BACK BY AND VERIFIED WITH:     S COGGINS RN 2.14.14 AT 2215 BY ROMEROJ   Studies/Results: No results found. Medications: I have reviewed the patient's current medications. Scheduled Meds: . amitriptyline  50 mg Oral QHS  . bd getting started take home  kit  1 kit Other Once  . Chlorhexidine Gluconate Cloth  6 each Topical Q0600  . clindamycin (CLEOCIN) IV  600 mg Intravenous Q8H  . collagenase   Topical Daily  . [START ON 07/15/2012] enoxaparin (LOVENOX) injection  40 mg Subcutaneous Q24H  . FLUoxetine  40 mg Oral Daily  . insulin aspart  0-20 Units Subcutaneous TID WC  . insulin aspart  0-5 Units Subcutaneous QHS  . insulin aspart  6 Units Subcutaneous TID WC  . insulin glargine  34 Units Subcutaneous QHS  . mupirocin ointment  1 application Nasal BID  . sodium chloride  10-40 mL Intracatheter Q12H   Continuous Infusions: . sodium chloride 100 mL/hr at 07/14/12 0600   PRN Meds:.sodium chloride, HYDROcodone-acetaminophen, HYDROmorphone (DILAUDID) injection, ondansetron (ZOFRAN) IV, sodium chloride Assessment/Plan: 1. Necrotizing fascitis: Pt was admitted on 2/14 and taken emergently to OR for surgical debridement of site suspicious for nec fasc. Over L groin. Pt was then immediately started on Vanc/zosyn/clinda. Repeat debridement of necrotic tissue on 2/15, 2/16, 2/17. Minimal blood loss. Wound and tissue cultures pending. Tissue culture MRSA R PCN/erythromycin and D-test negative. WOC concered that may need another debridement.  -continue Clindamycin -surgery for debridement today and multiple in the future  2. ATN: Pt Cr 1.9 (downtrending and responding to fluids)  today from presumed baseline of 0.7 on admission. UA shows waxy/granular casts and uric acid crystals along with hematuria (RBCs 21-50). Some of hematuria maybe 2/2 traumatic foley insertion.  -NS bolus again today now that have established access -continue foley with irrigations -repeat Bmet  3. Acute Anemia: Pt baseline Hgb 10 and today 6.8 yesterday pt received 1 PRBCs post-transfusion 7.2. Review of intraoperative notes doesn't appear to be etiology of bleeding. No active sites of bleeding. Post transfusion CBC.  -monitor CBC -transfuse <7.0  4. Poorly controlled DM  type 2: Hgb A1c 15.4 and outpt meds include metformin and glipizide. Pt initially presented in DKA on 2/14 with AG of 24 today AG 12. No ketones in urine.  -cont SSI -carb modified -will need to manage total insulin requirement -DM educator   Dispo: Disposition is deferred at this time, awaiting improvement of current medical problems.  Anticipated discharge in approximately 3-5 day(s).   The patient does have a current PCP (PRIBULA,CHRISTOPHER, MD), therefore will be requiring OPC follow-up after discharge.   The patient does not have transportation limitations that hinder transportation to clinic appointments.  .Services Needed at time of discharge: Y = Yes, Blank = No PT:   OT:   RN:   Equipment:   Other:     LOS: 8 days   Clinton Gallant 07/14/2012, 8:51 AM [id number]

## 2012-07-14 NOTE — Op Note (Signed)
NAMESOLOMIA, KEPPLER NO.:  [id number]  MEDICAL RECORD NO.:  [id number]  LOCATION:  57                         FACILITY:  [facility]  PHYSICIAN:  [name]. Redmond Pulling, MD, FACSDATE OF BIRTH:  Dec 10, 1975  DATE OF PROCEDURE:  07/14/2012 DATE OF DISCHARGE:                              OPERATIVE REPORT   PREOPERATIVE DIAGNOSIS:  Skin and soft tissue necrosis of left thigh, mons pubis.  POSTOPERATIVE DIAGNOSIS:  Skin and soft tissue necrosis of left thigh, mons pubis and perineum.  PROCEDURE:  Incision, drainage, and debridement of left mons pubis, left proximal thigh, left medial thigh, left posterior thigh with scalpel and Bovie electrocautery (25 cm).  SURGEON:  [name]. Redmond Pulling, MD, FACS  ASSISTANT SURGEON:  None.  ANESTHESIA:  General.  ESTIMATED BLOOD LOSS:  75 mL.  INDICATIONS FOR PROCEDURE:  The patient is a very pleasant 37 year old morbidly obese African American female, who presented about a week ago with necrotizing soft tissue infection of her left eye.  She has undergone several debridements in the operating room.  However, she has continued ongoing necrosis.  I discussed the risks and benefits of surgery including, but not limited to, bleeding, infection, injury to surrounding structures, such as blood vessels and nerves, need for ongoing debridement, worsening infection, need for additional procedures as well as a long postoperative course.  She elected to proceed to surgery.  DESCRIPTION OF PROCEDURE:  The patient was taken to the operating room at [facility] and identified as correct patient.  Surgical time-out was performed.  She was placed supine on the operating table.  General endotracheal anesthesia was established.  She was then placed in lithotomy position with appropriate padding.  The area was prepped and draped in the usual standard surgical fashion with Betadine.  She had a large elliptical wound starting in her anterior left thigh  proximally extending medially down into her groin and inner thigh area.  She had several satellite areas inferior and lateral to the prior incised and debrided area that had skin necrosis that were weeping pus, and also in her mons pubic area on the left side, she had an area of induration and thickening as well as a focal area of skin breakdown that was necrotic. I made an elliptical incision in the mons pubic area sharply with a #10 blade and excised the skin and subcutaneous fat.  This area measured approximately 3 x 3 cm.  All of the dead tissue was debrided with electrocautery.  Hemostasis was achieved.  There were 3 or 4 satellite lesions of dead skin and soft tissue inferior to her previously incised and debrided tissue on the left thigh.  Essentially, I connected all of these areas to create 1 large uniform wound.  This was done sharply with a scalpel as well as Bovie electrocautery.  There was grayish water in this area.  It tends to extend along the fascial planes.  I tried to debride all of the grayish black fat possible.  This also extended medially and towards the posterior thigh as well.  Again, it was debrided in similar fashion.  A total debridement of approximately 20 x 25 cm was performed.  The patient essentially has an L-shaped  elliptical wound with the L-part on her inner medial proximal thigh.  The length of the wound measures approximately 30 cm x 6 cm x about 8 cm deep.  She has a separate soft tissue defect on the mons pubic. Hemostasis was achieved.  Wet-to-dry Kerlix was used to pack the wound followed by dry dressing.  She was extubated and taken to recovery room in stable condition.  All needle, instrument, and sponge counts were correct x2.  There were no immediate complications.     [name]. Redmond Pulling, MD, FACS     EMW/MEDQ  D:  07/14/2012  T:  07/14/2012  Job:  [id number]

## 2012-07-14 NOTE — Anesthesia Preprocedure Evaluation (Addendum)
Anesthesia Evaluation  Patient identified by MRN, date of birth, ID band Patient awake    Reviewed: Allergy & Precautions, H&P , NPO status , Patient's Chart, lab work & pertinent test results, reviewed documented beta blocker date and time   History of Anesthesia Complications Negative for: history of anesthetic complications  Airway Mallampati: III TM Distance: >3 FB Neck ROM: full  Mouth opening: Limited Mouth Opening  Dental  (+) Dental Advisory Given and Teeth Intact   Pulmonary neg pulmonary ROS,  breath sounds clear to auscultation        Cardiovascular hypertension, On Medications negative cardio ROS  Rhythm:regular     Neuro/Psych PSYCHIATRIC DISORDERS Anxiety Depression  Neuromuscular disease negative neurological ROS     GI/Hepatic negative GI ROS, Neg liver ROS, GERD-  Medicated,  Endo/Other  diabetes, Insulin DependentMorbid obesity  Renal/GU ARFRenal disease  negative genitourinary   Musculoskeletal   Abdominal   Peds  Hematology  (+) anemia ,   Anesthesia Other Findings See surgeon's H&P   Reproductive/Obstetrics negative OB ROS                         Anesthesia Physical Anesthesia Plan  ASA: III  Anesthesia Plan: General   Post-op Pain Management:    Induction: Intravenous  Airway Management Planned: Oral ETT and Video Laryngoscope Planned  Additional Equipment:   Intra-op Plan:   Post-operative Plan: Extubation in OR  Informed Consent: I have reviewed the patients History and Physical, chart, labs and discussed the procedure including the risks, benefits and alternatives for the proposed anesthesia with the patient or authorized representative who has indicated his/her understanding and acceptance.   Dental Advisory Given  Plan Discussed with: CRNA and Surgeon  Anesthesia Plan Comments:         Anesthesia Quick Evaluation

## 2012-07-14 NOTE — Anesthesia Postprocedure Evaluation (Signed)
Anesthesia Post Note  Patient: [name] [name]  Procedure(s) Performed: Procedure(s) (LRB): DEBRIDEMENT OF SKIN & SOFT TISSUE; DRESSING CHANGE UNDER ANESTHESIA (Left)  Anesthesia type: general  Patient location: PACU  Post pain: Pain level controlled  Post assessment: Patient's Cardiovascular Status Stable  Last Vitals:  Filed Vitals:   07/14/12 1240  BP:   Pulse:   Temp:   Resp: 20    Post vital signs: Reviewed and stable  Level of consciousness: sedated  Complications: No apparent anesthesia complications

## 2012-07-14 NOTE — Preoperative (Signed)
Beta Blockers   Reason not to administer Beta Blockers:Not Applicable 

## 2012-07-14 NOTE — Transfer of Care (Signed)
Immediate Anesthesia Transfer of Care Note  Patient: [name] [name]  Procedure(s) Performed: Procedure(s): IRRIGATION AND DEBRIDEMENT OF MONS PUBIS, PERINEUM  (Left)  Patient Location: PACU  Anesthesia Type:General  Level of Consciousness: awake, alert  and oriented  Airway & Oxygen Therapy: Patient Spontanous Breathing and Patient connected to nasal cannula oxygen  Post-op Assessment: Report given to PACU RN and Post -op Vital signs reviewed and stable  Post vital signs: Reviewed and stable  Complications: No apparent anesthesia complications

## 2012-07-14 NOTE — Progress Notes (Signed)
5 Days Post-Op  Subjective: Pt without complaints. Has some itching on thigh.   Objective: Vital signs in last 24 hours: Temp:  [97.4 F (36.3 C)-98.2 F (36.8 C)] 97.4 F (36.3 C) (02/22 0742) Pulse Rate:  [95-96] 95 (02/22 0419) Resp:  [17-25] 20 (02/22 0419) BP: (110-121)/(58-81) 121/81 mmHg (02/22 0419) SpO2:  [96 %-98 %] 97 % (02/22 0419) Last BM Date: 07/06/12  Intake/Output from previous day: 02/21 0701 - 02/22 0700 In: 1630 [P.O.:480; I.V.:1150] Out: 2450 [Urine:2450] Intake/Output this shift: Total I/O In: -  Out: 900 [Urine:900]  Alert, nad cta b/l RRR Wound exam deferred since going shortly to OR  Lab Results:   Recent Labs  07/13/12 0420 07/14/12 0400  WBC 14.5* 11.5*  HGB 8.0* 7.3*  HCT 23.7* 21.6*  PLT 226 238   BMET  Recent Labs  07/13/12 0420 07/14/12 0400  NA 134* 135  K 4.0 4.0  CL 106 107  CO2 16* 18*  GLUCOSE 146* 80  BUN 23 22  CREATININE 1.91* 1.84*  CALCIUM 8.7 8.2*   PT/INR No results found for this basename: LABPROT, INR,  in the last 72 hours ABG No results found for this basename: PHART, PCO2, PO2, HCO3,  in the last 72 hours  Studies/Results: No results found.  Anti-infectives: Anti-infectives   Start     Dose/Rate Route Frequency Ordered Stop   07/09/12 1800  vancomycin (VANCOCIN) 2,000 mg in sodium chloride 0.9 % 500 mL IVPB  Status:  Discontinued     2,000 mg 250 mL/hr over 120 Minutes Intravenous Every 24 hours 07/08/12 2308 07/10/12 1132   07/07/12 1000  vancomycin (VANCOCIN) 1,250 mg in sodium chloride 0.9 % 250 mL IVPB  Status:  Discontinued     1,250 mg 166.7 mL/hr over 90 Minutes Intravenous Every 12 hours 07/06/12 2110 07/08/12 2251   07/07/12 0030  clindamycin (CLEOCIN) IVPB 600 mg     600 mg 100 mL/hr over 30 Minutes Intravenous 3 times per day 07/07/12 0019     07/06/12 2200  linezolid (ZYVOX) IVPB 600 mg  Status:  Discontinued     600 mg 300 mL/hr over 60 Minutes Intravenous Every 12 hours 07/06/12  1612 07/06/12 2050   07/06/12 2200  piperacillin-tazobactam (ZOSYN) IVPB 3.375 g  Status:  Discontinued     3.375 g 12.5 mL/hr over 240 Minutes Intravenous 3 times per day 07/06/12 1646 07/11/12 0751   07/06/12 2115  vancomycin (VANCOCIN) 2,000 mg in sodium chloride 0.9 % 500 mL IVPB     2,000 mg 250 mL/hr over 120 Minutes Intravenous  Once 07/06/12 2110 07/07/12 0042   07/06/12 2100  vancomycin (VANCOCIN) IVPB 1000 mg/200 mL premix  Status:  Discontinued     1,000 mg 200 mL/hr over 60 Minutes Intravenous  Once 07/06/12 2050 07/06/12 2109   07/06/12 1830  piperacillin-tazobactam (ZOSYN) IVPB 3.375 g  Status:  Discontinued     3.375 g 100 mL/hr over 30 Minutes Intravenous 4 times per day 07/06/12 1825 07/06/12 1858   07/06/12 1830  vancomycin (VANCOCIN) IVPB 1000 mg/200 mL premix  Status:  Discontinued     1,000 mg 200 mL/hr over 60 Minutes Intravenous Every 12 hours 07/06/12 1825 07/06/12 1920   07/06/12 1615  linezolid (ZYVOX) IVPB 600 mg  Status:  Discontinued     600 mg 300 mL/hr over 60 Minutes Intravenous Every 12 hours 07/06/12 1610 07/06/12 1619   07/06/12 1300  piperacillin-tazobactam (ZOSYN) IVPB 3.375 g     3.375  g 100 mL/hr over 30 Minutes Intravenous  Once 07/06/12 1250 07/06/12 1450   07/06/12 1300  clindamycin (CLEOCIN) IVPB 600 mg     600 mg 100 mL/hr over 30 Minutes Intravenous  Once 07/06/12 1253 07/06/12 1352   07/06/12 1130  vancomycin (VANCOCIN) IVPB 1000 mg/200 mL premix     1,000 mg 200 mL/hr over 60 Minutes Intravenous  Once 07/06/12 1117 07/06/12 1230      Assessment/Plan: Nec Fasciitis s/p Procedure(s): DRESSING CHANGE, THIGH WOUND (Left)  Pt seen. Chart and op notes reviewed. Based on Dr Trevor Mace exam of wound yesterday feels pt needs return trip to OR today for additional I&D& debridement for ongoing necrosis of L thigh/mons pubis/?perineum. Discussed risks & benefits of surgery with pt. Explained that she'll need on-going trips to OR and this will be  long process. All questions asked and answered.   Pt has T&C.  On therapeutic ABX. To OR later this am  [name]. Redmond Pulling, MD, FACS General, Bariatric, & Minimally Invasive Surgery [facility], [place]   LOS: 8 days    Gayland Curry 07/14/2012

## 2012-07-14 NOTE — Progress Notes (Signed)
Back from PACU, drowsy but no complaints.  CBG 85.  Continue to monitor.

## 2012-07-15 DIAGNOSIS — A419 Sepsis, unspecified organism: Secondary | ICD-10-CM

## 2012-07-15 DIAGNOSIS — E876 Hypokalemia: Secondary | ICD-10-CM

## 2012-07-15 DIAGNOSIS — E1310 Other specified diabetes mellitus with ketoacidosis without coma: Secondary | ICD-10-CM

## 2012-07-15 DIAGNOSIS — Z9119 Patient's noncompliance with other medical treatment and regimen: Secondary | ICD-10-CM

## 2012-07-15 LAB — BASIC METABOLIC PANEL
BUN: 22 mg/dL (ref 6–23)
Creatinine, Ser: 1.89 mg/dL — ABNORMAL HIGH (ref 0.50–1.10)
GFR calc Af Amer: 38 mL/min — ABNORMAL LOW (ref >90)
GFR calc non Af Amer: 33 mL/min — ABNORMAL LOW (ref >90)
Glucose, Bld: 136 mg/dL — ABNORMAL HIGH (ref 70–99)
Potassium: 4.2 mEq/L (ref 3.5–5.1)

## 2012-07-15 LAB — CBC
HCT: 20.5 % — ABNORMAL LOW (ref 36.0–46.0)
HCT: 25.3 % — ABNORMAL LOW (ref 36.0–46.0)
Hemoglobin: 6.9 g/dL — CL (ref 12.0–15.0)
MCH: 26.3 pg (ref 26.0–34.0)
MCH: 26.4 pg (ref 26.0–34.0)
MCHC: 33.7 g/dL (ref 30.0–36.0)
MCHC: 33.3 g/dL (ref 30.0–36.0)
MCHC: 34.4 g/dL (ref 30.0–36.0)
MCV: 78.2 fL (ref 78.0–100.0)
MCV: 77.6 fL — ABNORMAL LOW (ref 78.0–100.0)
Platelets: 387 10*3/uL (ref 150–400)
RDW: 15.5 % (ref 11.5–15.5)
RDW: 15 % (ref 11.5–15.5)
WBC: 12 10*3/uL — ABNORMAL HIGH (ref 4.0–10.5)

## 2012-07-15 LAB — HEPATIC FUNCTION PANEL
AST: 12 U/L (ref 0–37)
Bilirubin, Direct: <0.1 mg/dL (ref 0.0–0.3)
Total Bilirubin: 0.2 mg/dL — ABNORMAL LOW (ref 0.3–1.2)

## 2012-07-15 LAB — CK: Total CK: 168 U/L (ref 7–177)

## 2012-07-15 LAB — GLUCOSE, CAPILLARY
Glucose-Capillary: 122 mg/dL — ABNORMAL HIGH (ref 70–99)
Glucose-Capillary: 139 mg/dL — ABNORMAL HIGH (ref 70–99)

## 2012-07-15 LAB — LACTATE DEHYDROGENASE: LDH: 209 U/L (ref 94–250)

## 2012-07-15 MED ORDER — ALTEPLASE 2 MG IJ SOLR
2.0000 mg | Freq: Once | INTRAMUSCULAR | Status: AC
  Administered 2012-07-15: 2 mg
  Filled 2012-07-15: qty 2

## 2012-07-15 MED ORDER — FUROSEMIDE 10 MG/ML IJ SOLN
40.0000 mg | Freq: Once | INTRAMUSCULAR | Status: AC
  Administered 2012-07-15: 40 mg via INTRAVENOUS
  Filled 2012-07-15: qty 4

## 2012-07-15 MED ORDER — HEPARIN SODIUM (PORCINE) 5000 UNIT/ML IJ SOLN
5000.0000 [IU] | Freq: Three times a day (TID) | INTRAMUSCULAR | Status: DC
  Administered 2012-07-15 – 2012-07-26 (×33): 5000 [IU] via SUBCUTANEOUS
  Filled 2012-07-15 (×36): qty 1

## 2012-07-15 MED ORDER — SODIUM CHLORIDE 0.9 % IJ SOLN
INTRAMUSCULAR | Status: AC
  Administered 2012-07-15: 01:00:00
  Filled 2012-07-15: qty 20

## 2012-07-15 MED ORDER — DAPTOMYCIN 500 MG IV SOLR
640.0000 mg | INTRAVENOUS | Status: DC
  Administered 2012-07-15 – 2012-07-18 (×3): 640 mg via INTRAVENOUS
  Filled 2012-07-15 (×7): qty 12.8

## 2012-07-15 NOTE — Progress Notes (Signed)
Both ports on pt dl picc are not flushing IV team called will come to assess

## 2012-07-15 NOTE — Progress Notes (Signed)
Brief Interval Progress Note  We were alerted to a critical value of a hemoglobin of 6.0 this morning.  This value was a post-transfusion CBC, after 2 units of pRBC's were given for a Hb = 6.9 at 12:57 am (2/23).  This raised concern for a rapid blood loss anemia vs hemolytic anemia.  As such, we sent off repeat CBC, as well as LFT's, haptoglobin, LDH, and pathologist smear, and ordered transfusion of 2 more units of pRBC's.  However, the repeat CBC showed a hemoglobin of 8.7, meaning the hemoglobin of 6.0 this morning was just an erroneous value.  At this point, the patient is already receiving her first unit of blood.  We will finish this transfusion, but cancel the second ordered unit of blood.  Signed, Elnora Morrison, PGY2 07/15/2012, 5:57 PM

## 2012-07-15 NOTE — Progress Notes (Signed)
1 Day Post-Op  Subjective: Discussed results of or yesterday, no complaints  Objective: Vital signs in last 24 hours: Temp:  [97.5 F (36.4 C)-98.7 F (37.1 C)] 98.7 F (37.1 C) (02/23 0732) Pulse Rate:  [97-129] 99 (02/23 0545) Resp:  [13-26] 15 (02/23 0545) BP: (100-180)/(53-100) 104/61 mmHg (02/23 0620) SpO2:  [94 %-100 %] 94 % (02/23 0430) Weight:  [349 lb 10.4 oz (158.6 kg)] 349 lb 10.4 oz (158.6 kg) (02/23 0430) Last BM Date: 07/14/12  Intake/Output from previous day: 02/22 0701 - 02/23 0700 In: 3055 [P.O.:320; I.V.:2210; Blood:525] Out: [id number]; Blood:75] Intake/Output this shift: Total I/O In: -  Out: 475 [Urine:475]  Incision/Wound:dressing intact no surrounding skin changes, will need to be evaluated in or  Lab Results:   Recent Labs  07/14/12 0400 07/15/12 0057  WBC 11.5* 11.5*  HGB 7.3* 6.9*  HCT 21.6* 20.5*  PLT 238 280   BMET  Recent Labs  07/14/12 0400 07/15/12 0057  NA 135 132*  K 4.0 4.2  CL 107 105  CO2 18* 19  GLUCOSE 80 136*  BUN 22 22  CREATININE 1.84* 1.89*  CALCIUM 8.2* 8.2*    Anti-infectives: Anti-infectives   Start     Dose/Rate Route Frequency Ordered Stop   07/15/12 0830  DAPTOmycin (CUBICIN) 640 mg in sodium chloride 0.9 % IVPB     640 mg 225.6 mL/hr over 30 Minutes Intravenous Every 24 hours 07/15/12 0832     07/09/12 1800  vancomycin (VANCOCIN) 2,000 mg in sodium chloride 0.9 % 500 mL IVPB  Status:  Discontinued     2,000 mg 250 mL/hr over 120 Minutes Intravenous Every 24 hours 07/08/12 2308 07/10/12 1132   07/07/12 1000  vancomycin (VANCOCIN) 1,250 mg in sodium chloride 0.9 % 250 mL IVPB  Status:  Discontinued     1,250 mg 166.7 mL/hr over 90 Minutes Intravenous Every 12 hours 07/06/12 2110 07/08/12 2251   07/07/12 0030  clindamycin (CLEOCIN) IVPB 600 mg     600 mg 100 mL/hr over 30 Minutes Intravenous 3 times per day 07/07/12 0019     07/06/12 2200  linezolid (ZYVOX) IVPB 600 mg  Status:  Discontinued      600 mg 300 mL/hr over 60 Minutes Intravenous Every 12 hours 07/06/12 1612 07/06/12 2050   07/06/12 2200  piperacillin-tazobactam (ZOSYN) IVPB 3.375 g  Status:  Discontinued     3.375 g 12.5 mL/hr over 240 Minutes Intravenous 3 times per day 07/06/12 1646 07/11/12 0751   07/06/12 2115  vancomycin (VANCOCIN) 2,000 mg in sodium chloride 0.9 % 500 mL IVPB     2,000 mg 250 mL/hr over 120 Minutes Intravenous  Once 07/06/12 2110 07/07/12 0042   07/06/12 2100  vancomycin (VANCOCIN) IVPB 1000 mg/200 mL premix  Status:  Discontinued     1,000 mg 200 mL/hr over 60 Minutes Intravenous  Once 07/06/12 2050 07/06/12 2109   07/06/12 1830  piperacillin-tazobactam (ZOSYN) IVPB 3.375 g  Status:  Discontinued     3.375 g 100 mL/hr over 30 Minutes Intravenous 4 times per day 07/06/12 1825 07/06/12 1858   07/06/12 1830  vancomycin (VANCOCIN) IVPB 1000 mg/200 mL premix  Status:  Discontinued     1,000 mg 200 mL/hr over 60 Minutes Intravenous Every 12 hours 07/06/12 1825 07/06/12 1920   07/06/12 1615  linezolid (ZYVOX) IVPB 600 mg  Status:  Discontinued     600 mg 300 mL/hr over 60 Minutes Intravenous Every 12 hours 07/06/12 1610 07/06/12 1619  07/06/12 1300  piperacillin-tazobactam (ZOSYN) IVPB 3.375 g     3.375 g 100 mL/hr over 30 Minutes Intravenous  Once 07/06/12 1250 07/06/12 1450   07/06/12 1300  clindamycin (CLEOCIN) IVPB 600 mg     600 mg 100 mL/hr over 30 Minutes Intravenous  Once 07/06/12 1253 07/06/12 1352   07/06/12 1130  vancomycin (VANCOCIN) IVPB 1000 mg/200 mL premix     1,000 mg 200 mL/hr over 60 Minutes Intravenous  Once 07/06/12 1117 07/06/12 1230      Assessment/Plan: Nec fasc lle/ mons  Return to or tomorrow, she may very well need further debridement and multiple or trips She asked if leg would be amputated and I told her likely not.  She will have long course though with further surgery and wounds Nutrition of utmost importance in her Cont abx per medical  team  [facility] 07/15/2012

## 2012-07-15 NOTE — Progress Notes (Addendum)
Subjective: Pt remains afebrile and VSS. Pt POD #1 from re-bridement (4th). Pt maintaining good pain control.   Objective: Vital signs in last 24 hours: Filed Vitals:   07/15/12 0530 07/15/12 0545 07/15/12 0620 07/15/12 0732  BP: 119/70 127/75 104/61   Pulse: 100 99    Temp: 98.2 F (36.8 C) 98.1 F (36.7 C) 98.4 F (36.9 C) 98.7 F (37.1 C)  TempSrc: Oral Oral Oral Oral  Resp: 23 15    Height:      Weight:      SpO2:       Weight change:   Intake/Output Summary (Last 24 hours) at 07/15/12 0819 Last data filed at 07/15/12 [id number]  Gross per 24 hour  Intake   2955 ml  Output   3140 ml  Net   -185 ml   General: resting in bed, arousable, some distress HEENT: PERRL, EOMI, no scleral icterus Cardiac: RRR, no rubs, murmurs or gallops Pulm: clear to auscultation bilaterally, moving normal volumes of air Abd: soft, nontender, nondistended, BS present Ext: warm and well perfused, no pedal edema, scattered pustules over face and UE all different stages of healing, left groin dressing from medial thigh to left labial draining serosanginous fluid, ttp, no crepitus, but increasing induration around bandage borders, foley with sediment but no gross hematuria Neuro: alert and oriented X3, cranial nerves II-XII grossly intact, depressed affect  Lab Results: Basic Metabolic Panel:  Recent Labs Lab 07/14/12 0400 07/15/12 0057  NA 135 132*  K 4.0 4.2  CL 107 105  CO2 18* 19  GLUCOSE 80 136*  BUN 22 22  CREATININE 1.84* 1.89*  CALCIUM 8.2* 8.2*   CBC:  Recent Labs Lab 07/09/12 0440  07/14/12 0400 07/15/12 0057  WBC 11.3*  < > 11.5* 11.5*  NEUTROABS 8.3*  --   --   --   HGB 7.1*  < > 7.3* 6.9*  HCT 21.6*  < > 21.6* 20.5*  MCV 77.4*  < > 77.7* 78.2  PLT 200  < > 238 280  < > = values in this interval not displayed. CBG:  Recent Labs Lab 07/13/12 2218 07/14/12 0739 07/14/12 1203 07/14/12 1332 07/14/12 1710 07/14/12 2208  GLUCAP 82 94 83 85 219* 136*   Urine Drug  Screen: Drugs of Abuse     Component Value Date/Time   LABOPIA NONE DETECTED 04/11/2011 1656   COCAINSCRNUR NONE DETECTED 04/11/2011 1656   LABBENZ NONE DETECTED 04/11/2011 1656   AMPHETMU NONE DETECTED 04/11/2011 1656   THCU NONE DETECTED 04/11/2011 1656   LABBARB NONE DETECTED 04/11/2011 1656    Urinalysis:  Recent Labs Lab 07/09/12 0345  COLORURINE AMBER*  LABSPEC 1.017  PHURINE 5.0  GLUCOSEU 250*  HGBUR LARGE*  BILIRUBINUR NEGATIVE  KETONESUR NEGATIVE  PROTEINUR NEGATIVE  UROBILINOGEN 1.0  NITRITE NEGATIVE  LEUKOCYTESUR TRACE*   Micro Results: Recent Results (from the past 240 hour(s))  CULTURE, BLOOD (ROUTINE X 2)     Status: None   Collection Time    07/06/12 10:50 AM      Result Value Range Status   Specimen Description BLOOD LEFT ANTECUBITAL   Final   Special Requests BOTTLES DRAWN AEROBIC ONLY 10CC   Final   Culture  Setup Time 07/06/2012 18:19   Final   Culture NO GROWTH 5 DAYS   Final   Report Status 07/12/2012 FINAL   Final  CULTURE, BLOOD (ROUTINE X 2)     Status: None   Collection Time    07/06/12  11:00 AM      Result Value Range Status   Specimen Description BLOOD RIGHT ANTECUBITAL   Final   Special Requests BOTTLES DRAWN AEROBIC ONLY 2CC   Final   Culture  Setup Time 07/06/2012 18:19   Final   Culture NO GROWTH 5 DAYS   Final   Report Status 07/12/2012 FINAL   Final  WOUND CULTURE     Status: None   Collection Time    07/06/12  3:11 PM      Result Value Range Status   Specimen Description WOUND THIGH LEFT   Final   Special Requests PT ON ZOSYN,VANCOMYCIN AND CLEOCIN   Final   Gram Stain     Final   Value: RARE WBC PRESENT, PREDOMINANTLY PMN     NO SQUAMOUS EPITHELIAL CELLS SEEN     ABUNDANT GRAM POSITIVE RODS     FEW GRAM POSITIVE COCCI     IN PAIRS IN CLUSTERS   Culture     Final   Value: RARE METHICILLIN RESISTANT STAPHYLOCOCCUS AUREUS     Note: RIFAMPIN AND GENTAMICIN SHOULD NOT BE USED AS SINGLE DRUGS FOR TREATMENT OF STAPH INFECTIONS.  This organism DOES NOT demonstrate inducible Clindamycin resistance in vitro. CRITICAL RESULT CALLED TO, READ BACK BY AND VERIFIED WITH: ROBIN WOFFORD BY      INGRAM A 2/18 8AM   Report Status 07/10/2012 FINAL   Final   Organism ID, Bacteria METHICILLIN RESISTANT STAPHYLOCOCCUS AUREUS   Final  ANAEROBIC CULTURE     Status: None   Collection Time    07/06/12  3:11 PM      Result Value Range Status   Specimen Description WOUND THIGH LEFT   Final   Special Requests PT ON ZOSYN,VANCOMYCIN AND CLEOCIN   Final   Gram Stain     Final   Value: RARE WBC PRESENT, PREDOMINANTLY PMN     NO SQUAMOUS EPITHELIAL CELLS SEEN     ABUNDANT GRAM POSITIVE RODS     FEW GRAM POSITIVE COCCI IN PAIRS     IN CLUSTERS   Culture NO ANAEROBES ISOLATED   Final   Report Status 07/11/2012 FINAL   Final  MRSA PCR SCREENING     Status: Abnormal   Collection Time    07/06/12  8:12 PM      Result Value Range Status   MRSA by PCR POSITIVE (*) NEGATIVE Final   Comment:            The GeneXpert MRSA Assay (FDA     approved for NASAL specimens     only), is one component of a     comprehensive MRSA colonization     surveillance program. It is not     intended to diagnose MRSA     infection nor to guide or     monitor treatment for     MRSA infections.     RESULT CALLED TO, READ BACK BY AND VERIFIED WITH:     S COGGINS RN 2.14.14 AT 2215 BY ROMEROJ   Studies/Results: No results found. Medications: I have reviewed the patient's current medications. Scheduled Meds: . amitriptyline  50 mg Oral QHS  . bd getting started take home kit  1 kit Other Once  . clindamycin (CLEOCIN) IV  600 mg Intravenous Q8H  . collagenase   Topical Daily  . enoxaparin (LOVENOX) injection  40 mg Subcutaneous Q24H  . FLUoxetine  40 mg Oral Daily  . furosemide  40 mg Intravenous Once  .  insulin aspart  0-20 Units Subcutaneous TID WC  . insulin aspart  0-5 Units Subcutaneous QHS  . insulin aspart  6 Units Subcutaneous TID WC  . insulin  glargine  34 Units Subcutaneous QHS  . sodium chloride  10-40 mL Intracatheter Q12H   Continuous Infusions: . sodium chloride 100 mL/hr at 07/14/12 1418   PRN Meds:.HYDROcodone-acetaminophen, HYDROmorphone (DILAUDID) injection, ondansetron (ZOFRAN) IV, sodium chloride Assessment/Plan: 1. Necrotizing fascitis: Pt was admitted on 2/14 and taken emergently to OR for surgical debridement of site suspicious for nec fasc. Over L groin. Pt was then immediately started on Vanc/zosyn/clinda. Repeat debridement of necrotic tissue on 2/15, 2/16, 2/17. Minimal blood loss. Wound and tissue cultures pending. Tissue culture MRSA R PCN/erythromycin and D-test negative. WOC concered that may need another debridement.  -continue Clindamycin -add daptomycin today given still significant infection -surgery following   2. ATN: Pt Cr 1.8 (downtrending and responding to fluids)  today from presumed baseline of 0.7 on admission. Previous UA shows waxy/granular casts and uric acid crystals along with hematuria (RBCs 21-50). Some of hematuria maybe 2/2 traumatic foley insertion.  -NS bolus again today but pt seems to be gaining weight (most likely 3rd spacing) will add lasix 40mg  IV and monitor  -continue foley with irrigations -repeat Bmet  3. Acute Anemia: Pt baseline Hgb 10 and today 6.8 yesterday pt received 2 PRBCs post-transfusion pending . Review of intraoperative notes doesn't appear to be etiology of bleeding. No active sites of bleeding.  -monitor CBC -transfuse <7.0  4. Poorly controlled DM type 2: Hgb A1c 15.4 and outpt meds include metformin and glipizide. Pt initially presented in DKA on 2/14 with AG of 24 today AG 12. No ketones in urine.  -cont SSI -carb modified -will need to manage total insulin requirement -DM educator   Dispo: Disposition is deferred at this time, awaiting improvement of current medical problems.  Anticipated discharge in approximately 3-5 day(s).   The patient does have a  current PCP (PRIBULA,CHRISTOPHER, MD), therefore will be requiring OPC follow-up after discharge.   The patient does not have transportation limitations that hinder transportation to clinic appointments.  .Services Needed at time of discharge: Y = Yes, Blank = No PT:   OT:   RN:   Equipment:   Other:     LOS: 9 days   Clinton Gallant 07/15/2012, 8:19 AM [id number]

## 2012-07-15 NOTE — Progress Notes (Signed)
Patient seen and examined and case reviewed with residents.  She continues to have significant necrosis.  Culture does show MRSA sensitive to clindamycin.  I feel a second agent is indicated in case of developing resistance and will add daptomycin 6 mg/kg to clindamycin.  Avoiding vancomycin with kidney injury.  She will remain in step down due to critical nature of her illness.

## 2012-07-15 NOTE — Progress Notes (Signed)
ANTIBIOTIC CONSULT NOTE - INITIAL  Pharmacy Consult for Daptomycin Indication: Thigh abscess  No Known Allergies  Patient Measurements: Height: 5\' 10"  (177.8 cm) Weight: 349 lb 10.4 oz (158.6 kg) IBW/kg (Calculated) : 68.5 Adjusted Body Weight:   Vital Signs: Temp: 98.7 F (37.1 C) (02/23 0732) Temp src: Oral (02/23 0732) BP: 104/61 mmHg (02/23 0620) Pulse Rate: 99 (02/23 0545) Intake/Output from previous day: 02/22 0701 - 02/23 0700 In: 3055 [P.O.:320; I.V.:2210; Blood:525] Out: [id number]; Blood:75] Intake/Output from this shift: Total I/O In: -  Out: 475 [Urine:475]  Labs:  Recent Labs  07/13/12 0420 07/14/12 0400 07/15/12 0057  WBC 14.5* 11.5* 11.5*  HGB 8.0* 7.3* 6.9*  PLT 226 238 280  CREATININE 1.91* 1.84* 1.89*   Estimated Creatinine Clearance: 67.9 ml/min (by C-G formula based on Cr of 1.89). No results found for this basename: VANCOTROUGH, VANCOPEAK, VANCORANDOM, GENTTROUGH, GENTPEAK, GENTRANDOM, TOBRATROUGH, TOBRAPEAK, TOBRARND, AMIKACINPEAK, AMIKACINTROU, AMIKACIN,  in the last 72 hours   Microbiology: Recent Results (from the past 720 hour(s))  CULTURE, BLOOD (ROUTINE X 2)     Status: None   Collection Time    07/06/12 10:50 AM      Result Value Range Status   Specimen Description BLOOD LEFT ANTECUBITAL   Final   Special Requests BOTTLES DRAWN AEROBIC ONLY 10CC   Final   Culture  Setup Time 07/06/2012 18:19   Final   Culture NO GROWTH 5 DAYS   Final   Report Status 07/12/2012 FINAL   Final  CULTURE, BLOOD (ROUTINE X 2)     Status: None   Collection Time    07/06/12 11:00 AM      Result Value Range Status   Specimen Description BLOOD RIGHT ANTECUBITAL   Final   Special Requests BOTTLES DRAWN AEROBIC ONLY 2CC   Final   Culture  Setup Time 07/06/2012 18:19   Final   Culture NO GROWTH 5 DAYS   Final   Report Status 07/12/2012 FINAL   Final  WOUND CULTURE     Status: None   Collection Time    07/06/12  3:11 PM      Result Value Range  Status   Specimen Description WOUND THIGH LEFT   Final   Special Requests PT ON ZOSYN,VANCOMYCIN AND CLEOCIN   Final   Gram Stain     Final   Value: RARE WBC PRESENT, PREDOMINANTLY PMN     NO SQUAMOUS EPITHELIAL CELLS SEEN     ABUNDANT GRAM POSITIVE RODS     FEW GRAM POSITIVE COCCI     IN PAIRS IN CLUSTERS   Culture     Final   Value: RARE METHICILLIN RESISTANT STAPHYLOCOCCUS AUREUS     Note: RIFAMPIN AND GENTAMICIN SHOULD NOT BE USED AS SINGLE DRUGS FOR TREATMENT OF STAPH INFECTIONS. This organism DOES NOT demonstrate inducible Clindamycin resistance in vitro. CRITICAL RESULT CALLED TO, READ BACK BY AND VERIFIED WITH: ROBIN WOFFORD BY      INGRAM A 2/18 8AM   Report Status 07/10/2012 FINAL   Final   Organism ID, Bacteria METHICILLIN RESISTANT STAPHYLOCOCCUS AUREUS   Final  ANAEROBIC CULTURE     Status: None   Collection Time    07/06/12  3:11 PM      Result Value Range Status   Specimen Description WOUND THIGH LEFT   Final   Special Requests PT ON ZOSYN,VANCOMYCIN AND CLEOCIN   Final   Gram Stain     Final   Value: RARE WBC PRESENT, PREDOMINANTLY  PMN     NO SQUAMOUS EPITHELIAL CELLS SEEN     ABUNDANT GRAM POSITIVE RODS     FEW GRAM POSITIVE COCCI IN PAIRS     IN CLUSTERS   Culture NO ANAEROBES ISOLATED   Final   Report Status 07/11/2012 FINAL   Final  MRSA PCR SCREENING     Status: Abnormal   Collection Time    07/06/12  8:12 PM      Result Value Range Status   MRSA by PCR POSITIVE (*) NEGATIVE Final   Comment:            The GeneXpert MRSA Assay (FDA     approved for NASAL specimens     only), is one component of a     comprehensive MRSA colonization     surveillance program. It is not     intended to diagnose MRSA     infection nor to guide or     monitor treatment for     MRSA infections.     RESULT CALLED TO, READ BACK BY AND VERIFIED WITH:     S COGGINS RN 2.14.14 AT 2215 BY ROMEROJ    Medical History: Past Medical History  Diagnosis Date  . Diabetes mellitus      Type 2  . Hyperlipidemia   . Hypertension   . Anxiety   . Depression   . Abscess     history of multiple abscesses    Medications:  Prescriptions prior to admission  Medication Sig Dispense Refill  . amitriptyline (ELAVIL) 50 MG tablet Take 1 tablet (50 mg total) by mouth at bedtime.  30 tablet  6  . FLUoxetine (PROZAC) 40 MG capsule Take 1 capsule (40 mg total) by mouth daily.  30 capsule  6  . glipiZIDE (GLUCOTROL) 10 MG tablet Take 1 tablet (10 mg total) by mouth 2 (two) times daily before a meal.  60 tablet  6  . lisinopril (PRINIVIL,ZESTRIL) 5 MG tablet Take 1 tablet (5 mg total) by mouth daily.  30 tablet  6  . metFORMIN (GLUCOPHAGE) 1000 MG tablet Take 1 tablet (1,000 mg total) by mouth 2 (two) times daily with a meal.  60 tablet  11  . miconazole (MONISTAT 7) 2 % vaginal cream Place 1 applicator vaginally at bedtime.      . pravastatin (PRAVACHOL) 20 MG tablet Take 1 tablet (20 mg total) by mouth daily.  90 tablet  3   Assessment: 37yof on Clindamycin Day 10 now to start Daptomycin for thigh abscess/necrotizing fascitis. Patient has had multiple I&Ds, last 2/22. Patient is currently afebrile and WBC 11.5. - Weight: 158.6kg - CrCl ~60 ml/min  Goal of Therapy:  Clinical improvement  Plan:  1. Daptomycin 640mg  (~4mg /kg) IV q24h 2. Check CK now and weekly  Earleen Newport [id number] 07/15/2012,8:25 AM

## 2012-07-16 ENCOUNTER — Encounter (HOSPITAL_COMMUNITY): Payer: Self-pay | Admitting: General Surgery

## 2012-07-16 ENCOUNTER — Encounter (HOSPITAL_COMMUNITY)
Admission: EM | Disposition: A | Discharge status: Nursing Home (Includes ICF) | Payer: Self-pay | Source: Home / Self Care | Attending: Internal Medicine

## 2012-07-16 ENCOUNTER — Encounter (HOSPITAL_COMMUNITY): Payer: Self-pay | Admitting: Anesthesiology

## 2012-07-16 ENCOUNTER — Inpatient Hospital Stay (HOSPITAL_COMMUNITY): Payer: Self-pay | Admitting: Anesthesiology

## 2012-07-16 HISTORY — PX: INCISION AND DRAINAGE PERIRECTAL ABSCESS: SHX1804

## 2012-07-16 LAB — CBC
HCT: 25.2 % — ABNORMAL LOW (ref 36.0–46.0)
MCH: 26.5 pg (ref 26.0–34.0)
MCHC: 33.7 g/dL (ref 30.0–36.0)
RDW: 15.1 % (ref 11.5–15.5)

## 2012-07-16 LAB — TYPE AND SCREEN
Antibody Screen: NEGATIVE
Unit division: 0
Unit division: 0
Unit division: 0

## 2012-07-16 LAB — BASIC METABOLIC PANEL
BUN: 20 mg/dL (ref 6–23)
Calcium: 8.1 mg/dL — ABNORMAL LOW (ref 8.4–10.5)
Creatinine, Ser: 1.79 mg/dL — ABNORMAL HIGH (ref 0.50–1.10)
GFR calc Af Amer: 41 mL/min — ABNORMAL LOW (ref >90)
GFR calc non Af Amer: 35 mL/min — ABNORMAL LOW (ref >90)
Potassium: 3.8 mEq/L (ref 3.5–5.1)

## 2012-07-16 LAB — GLUCOSE, CAPILLARY
Glucose-Capillary: 118 mg/dL — ABNORMAL HIGH (ref 70–99)
Glucose-Capillary: 101 mg/dL — ABNORMAL HIGH (ref 70–99)

## 2012-07-16 SURGERY — INCISION AND DRAINAGE, ABSCESS, PERIRECTAL
Anesthesia: General | Site: Leg Upper | Laterality: Left | Wound class: Dirty or Infected

## 2012-07-16 MED ORDER — HYDROMORPHONE HCL PF 1 MG/ML IJ SOLN
0.2500 mg | INTRAMUSCULAR | Status: DC | PRN
  Administered 2012-07-16 (×2): 0.5 mg via INTRAVENOUS

## 2012-07-16 MED ORDER — OXYCODONE HCL 5 MG PO TABS: 5.0000 mg | ORAL_TABLET | Freq: Once | ORAL | Status: DC | PRN

## 2012-07-16 MED ORDER — LACTATED RINGERS IV SOLN
INTRAVENOUS | Status: DC | PRN
  Administered 2012-07-16: 12:00:00 via INTRAVENOUS

## 2012-07-16 MED ORDER — ROCURONIUM BROMIDE 100 MG/10ML IV SOLN
INTRAVENOUS | Status: DC | PRN
  Administered 2012-07-16: 50 mg via INTRAVENOUS

## 2012-07-16 MED ORDER — PROMETHAZINE HCL 25 MG/ML IJ SOLN: 6.2500 mg | INTRAMUSCULAR | Status: DC | PRN

## 2012-07-16 MED ORDER — OXYCODONE HCL 5 MG/5ML PO SOLN: 5.0000 mg | Freq: Once | ORAL | Status: DC | PRN

## 2012-07-16 MED ORDER — NEOSTIGMINE METHYLSULFATE 1 MG/ML IJ SOLN
INTRAMUSCULAR | Status: DC | PRN
  Administered 2012-07-16: 4 mg via INTRAVENOUS

## 2012-07-16 MED ORDER — PROPOFOL 10 MG/ML IV BOLUS
INTRAVENOUS | Status: DC | PRN
  Administered 2012-07-16: 100 mg via INTRAVENOUS
  Administered 2012-07-16: 150 mg via INTRAVENOUS

## 2012-07-16 MED ORDER — FENTANYL CITRATE 0.05 MG/ML IJ SOLN
INTRAMUSCULAR | Status: DC | PRN
  Administered 2012-07-16: 100 ug via INTRAVENOUS
  Administered 2012-07-16 (×3): 50 ug via INTRAVENOUS

## 2012-07-16 MED ORDER — HYDROMORPHONE HCL PF 1 MG/ML IJ SOLN
INTRAMUSCULAR | Status: AC
  Filled 2012-07-16: qty 1

## 2012-07-16 MED ORDER — GLYCOPYRROLATE 0.2 MG/ML IJ SOLN
INTRAMUSCULAR | Status: DC | PRN
  Administered 2012-07-16: 0.6 mg via INTRAVENOUS

## 2012-07-16 MED ORDER — MIDAZOLAM HCL 2 MG/2ML IJ SOLN: 0.5000 mg | Freq: Once | INTRAMUSCULAR | Status: DC | PRN

## 2012-07-16 MED ORDER — MEPERIDINE HCL 25 MG/ML IJ SOLN: 6.2500 mg | INTRAMUSCULAR | Status: DC | PRN

## 2012-07-16 MED ORDER — ONDANSETRON HCL 4 MG/2ML IJ SOLN
INTRAMUSCULAR | Status: DC | PRN
  Administered 2012-07-16: 4 mg via INTRAVENOUS

## 2012-07-16 MED ORDER — ARTIFICIAL TEARS OP OINT
TOPICAL_OINTMENT | OPHTHALMIC | Status: DC | PRN
  Administered 2012-07-16: 1 via OPHTHALMIC

## 2012-07-16 MED ORDER — DIPHENHYDRAMINE HCL 25 MG PO CAPS
50.0000 mg | ORAL_CAPSULE | Freq: Four times a day (QID) | ORAL | Status: AC | PRN
  Administered 2012-07-16 – 2012-07-18 (×2): 50 mg via ORAL
  Filled 2012-07-16 (×2): qty 2

## 2012-07-16 MED ORDER — FUROSEMIDE 10 MG/ML IJ SOLN
40.0000 mg | Freq: Two times a day (BID) | INTRAMUSCULAR | Status: DC
  Administered 2012-07-16 – 2012-07-18 (×6): 40 mg via INTRAVENOUS
  Filled 2012-07-16 (×8): qty 4

## 2012-07-16 MED ORDER — 0.9 % SODIUM CHLORIDE (POUR BTL) OPTIME
TOPICAL | Status: DC | PRN
  Administered 2012-07-16: 1000 mL

## 2012-07-16 MED ORDER — LIDOCAINE HCL (CARDIAC) 20 MG/ML IV SOLN
INTRAVENOUS | Status: DC | PRN
  Administered 2012-07-16: 10 mg via INTRAVENOUS

## 2012-07-16 SURGICAL SUPPLY — 42 items
BANDAGE GAUZE 4  KLING STR (GAUZE/BANDAGES/DRESSINGS) ×1 IMPLANT
BANDAGE GAUZE ELAST BULKY 4 IN (GAUZE/BANDAGES/DRESSINGS) ×3 IMPLANT
BLADE SURG 10 STRL SS (BLADE) ×4 IMPLANT
BLADE SURG 15 STRL LF DISP TIS (BLADE) ×1 IMPLANT
BLADE SURG 15 STRL SS (BLADE) ×2
BRUSH FEMORAL CANAL (MISCELLANEOUS) ×1 IMPLANT
CANISTER SUCTION 2500CC (MISCELLANEOUS) ×2 IMPLANT
CLEANER TIP ELECTROSURG 2X2 (MISCELLANEOUS) ×2 IMPLANT
CLOTH BEACON ORANGE TIMEOUT ST (SAFETY) ×2 IMPLANT
COVER SURGICAL LIGHT HANDLE (MISCELLANEOUS) ×2 IMPLANT
DRAPE LAPAROTOMY T 102X78X121 (DRAPES) ×1 IMPLANT
DRSG PAD ABDOMINAL 8X10 ST (GAUZE/BANDAGES/DRESSINGS) ×2 IMPLANT
ELECT REM PT RETURN 9FT ADLT (ELECTROSURGICAL) ×2
ELECTRODE REM PT RTRN 9FT ADLT (ELECTROSURGICAL) ×1 IMPLANT
GAUZE PACKING IODOFORM 1 (PACKING) IMPLANT
GAUZE SPONGE 4X4 16PLY XRAY LF (GAUZE/BANDAGES/DRESSINGS) ×2 IMPLANT
GLOVE BIOGEL PI IND STRL 7.0 (GLOVE) IMPLANT
GLOVE BIOGEL PI IND STRL 8 (GLOVE) ×1 IMPLANT
GLOVE BIOGEL PI INDICATOR 7.0 (GLOVE) ×1
GLOVE BIOGEL PI INDICATOR 8 (GLOVE) ×1
GLOVE ECLIPSE 7.5 STRL STRAW (GLOVE) ×2 IMPLANT
GOWN STRL NON-REIN LRG LVL3 (GOWN DISPOSABLE) ×4 IMPLANT
HANDPIECE INTERPULSE COAX TIP (DISPOSABLE) ×2
KIT BASIN OR (CUSTOM PROCEDURE TRAY) ×2 IMPLANT
KIT ROOM TURNOVER OR (KITS) ×2 IMPLANT
NS IRRIG 1000ML POUR BTL (IV SOLUTION) ×2 IMPLANT
PACK LITHOTOMY IV (CUSTOM PROCEDURE TRAY) ×2 IMPLANT
PAD ARMBOARD 7.5X6 YLW CONV (MISCELLANEOUS) ×2 IMPLANT
PENCIL BUTTON HOLSTER BLD 10FT (ELECTRODE) ×2 IMPLANT
SET HNDPC FAN SPRY TIP SCT (DISPOSABLE) IMPLANT
SPONGE GAUZE 4X4 12PLY (GAUZE/BANDAGES/DRESSINGS) ×2 IMPLANT
SPONGE LAP 18X18 X RAY DECT (DISPOSABLE) ×1 IMPLANT
STAPLER VISISTAT 35W (STAPLE) ×1 IMPLANT
SURGILUBE 2OZ TUBE FLIPTOP (MISCELLANEOUS) ×2 IMPLANT
SWAB COLLECTION DEVICE MRSA (MISCELLANEOUS) IMPLANT
TOWEL OR 17X24 6PK STRL BLUE (TOWEL DISPOSABLE) ×2 IMPLANT
TOWEL OR 17X26 10 PK STRL BLUE (TOWEL DISPOSABLE) ×2 IMPLANT
TUBE ANAEROBIC SPECIMEN COL (MISCELLANEOUS) IMPLANT
TUBE CONNECTING 12X1/4 (SUCTIONS) ×2 IMPLANT
UNDERPAD 30X30 INCONTINENT (UNDERPADS AND DIAPERS) ×2 IMPLANT
WATER STERILE IRR 1000ML POUR (IV SOLUTION) IMPLANT
YANKAUER SUCT BULB TIP NO VENT (SUCTIONS) ×2 IMPLANT

## 2012-07-16 NOTE — Anesthesia Postprocedure Evaluation (Signed)
  Anesthesia Post-op Note  Patient: [name] [name]  Procedure(s) Performed: Procedure(s): I&D Left Thigh (Left)  Patient Location: PACU  Anesthesia Type:General  Level of Consciousness: awake, alert , oriented and patient cooperative  Airway and Oxygen Therapy: Patient Spontanous Breathing and Patient connected to nasal cannula oxygen  Post-op Pain: mild  Post-op Assessment: Post-op Vital signs reviewed, Patient's Cardiovascular Status Stable, Respiratory Function Stable, Patent Airway, No signs of Nausea or vomiting and Pain level controlled  Post-op Vital Signs: Reviewed and stable  Complications: No apparent anesthesia complications

## 2012-07-16 NOTE — Progress Notes (Signed)
Patient in the restroom.  Should go to the OR for further debridement as indicated by the weekend surgeon.  I have never examined this patient and will get my first opportunity to do so in the OR.  This will be done in the lithotomy position. Kathryne Eriksson. Dahlia Bailiff, MD, [place] [phone] [facility] Surgery

## 2012-07-16 NOTE — Progress Notes (Signed)
Utilization review completed.  

## 2012-07-16 NOTE — Anesthesia Preprocedure Evaluation (Signed)
Anesthesia Evaluation  Patient identified by MRN, date of birth, ID band Patient awake    Reviewed: Allergy & Precautions, H&P , NPO status , Patient's Chart, lab work & pertinent test results  History of Anesthesia Complications Negative for: history of anesthetic complications  Airway Mallampati: II TM Distance: >3 FB Neck ROM: Full    Dental  (+) Teeth Intact and Dental Advisory Given   Pulmonary former smoker (quit 2 years),  breath sounds clear to auscultation  Pulmonary exam normal       Cardiovascular hypertension, Pt. on medications Rhythm:Regular Rate:Normal     Neuro/Psych PSYCHIATRIC DISORDERS Anxiety Depression  Neuromuscular disease (peripheral neuropathy)    GI/Hepatic GERD-  Medicated and Controlled,  Endo/Other  diabetes (glu 118), Well Controlled, Type 2, Insulin Dependent and Oral Hypoglycemic AgentsMorbid obesity  Renal/GU Renal InsufficiencyRenal disease (creat 1.79)     Musculoskeletal   Abdominal (+) + obese,   Peds  Hematology  (+) Blood dyscrasia (Hb 8.5), anemia ,   Anesthesia Other Findings   Reproductive/Obstetrics                           Anesthesia Physical Anesthesia Plan  ASA: III  Anesthesia Plan: General   Post-op Pain Management:    Induction: Intravenous  Airway Management Planned: LMA  Additional Equipment:   Intra-op Plan:   Post-operative Plan:   Informed Consent: I have reviewed the patients History and Physical, chart, labs and discussed the procedure including the risks, benefits and alternatives for the proposed anesthesia with the patient or authorized representative who has indicated his/her understanding and acceptance.   Dental advisory given  Plan Discussed with: Surgeon and CRNA  Anesthesia Plan Comments: (Plan routine monitors, GA- LMA OK)        Anesthesia Quick Evaluation

## 2012-07-16 NOTE — Op Note (Signed)
OPERATIVE REPORT  DATE OF OPERATION: 07/06/2012 - 07/16/2012  PATIENT:  [name] [name]  37 y.o. female  PRE-OPERATIVE DIAGNOSIS:  Necrotizing fasciitis  POST-OPERATIVE DIAGNOSIS:  Necrotizing fasciitis of the left mons pubis, left inguinal area, left perineum, left gluteal area, and left medial thigh  PROCEDURE:  Procedure(s): I&D of the Above mentioned areas  SURGEON:  Surgeon(s): Gwenyth Ober, MD  ASSISTANT: None  ANESTHESIA:   general  EBL: <50 ml  BLOOD ADMINISTERED: none  DRAINS: Packing with three saline soaked Kerlix rolls   SPECIMEN:  No Specimen  COUNTS CORRECT:  YES  PROCEDURE DETAILS: The patient was taken to the operating room and placed on the table in supine position. After an adequate general endotracheal anesthetic was administered she was placed in the lithotomy position and prepped and draped in usual sterile manner exposing a peritoneum after proper time out was performed identifying the patient and procedure to be performed we used a #10 blade to help debridement multiple areas in the posterior perineum and gluteal area left medial thigh single area in the mons pubis. There were 2 pockets of dishwater drainage along the lateral aspect of the left mons pubis and the left in the area. These were both opened up in total and then subsequently washed out significantly with a pulse lavage device.  A bristled to attachment to the pulse lavage was used in both feet, barium the left mons pubis area in the left anal area once we had adequate debridement and bleeding was controlled we packed the area [place] with 3 Kerlix gauze soaked in saline. The left mons pubis and left inguinal Kerlix gauze did not connect being separated by a fascial plane. All counts were correct at the end of the procedure.  PATIENT DISPOSITION:  PACU - hemodynamically stable.   Gwenyth Ober 2/24/20141:56 PM

## 2012-07-16 NOTE — Progress Notes (Signed)
Hydrotherapy Cancellation Note  Patient Details Name: [name] [name] MRN: [id number] DOB: Dec 25, 1975   Cancelled Treatment:    Reason Treat Not Completed: Noted MD plans for return to OR today for further debridement.   MD--Please clarify when or if hydrotherapy is to resume after surgery.   Alando Colleran Pager [phone]  07/16/2012, 8:21 AM

## 2012-07-16 NOTE — Progress Notes (Signed)
Subjective: Pt remains afebrile and VSS. Pt POD #2 from re-bridement (4th). Pt having some increased pain and bladder spasm sensation. Pt set to go to OR this AM again for further debridement. Pt slightly anxious.   Objective: Vital signs in last 24 hours: Filed Vitals:   07/15/12 0530 07/15/12 0545 07/15/12 0620 07/15/12 0732  BP: 119/70 127/75 104/61   Pulse: 100 99    Temp: 98.2 F (36.8 C) 98.1 F (36.7 C) 98.4 F (36.9 C) 98.7 F (37.1 C)  TempSrc: Oral Oral Oral Oral  Resp: 23 15    Height:      Weight:      SpO2:       Weight change:   Intake/Output Summary (Last 24 hours) at 07/15/12 0819 Last data filed at 07/15/12 [id number]  Gross per 24 hour  Intake   2955 ml  Output   3140 ml  Net   -185 ml   General: resting in bed,NAD, anxious HEENT: PERRL, EOMI, no scleral icterus Cardiac: RRR, no rubs, murmurs or gallops Pulm: clear to auscultation bilaterally, moving normal volumes of air Abd: soft, nontender, nondistended, BS present Ext: warm and well perfused, no pedal edema, scattered pustules over face and UE all different stages of healing, left groin dressing from medial thigh to left labial draining serosanginous fluid, ttp, no crepitus, but increasing induration around bandage borders, foley with sediment but no gross hematuria Neuro: alert and oriented X3, cranial nerves II-XII grossly intact, depressed affect  Lab Results: Basic Metabolic Panel:  Recent Labs Lab 07/14/12 0400 07/15/12 0057  NA 135 132*  K 4.0 4.2  CL 107 105  CO2 18* 19  GLUCOSE 80 136*  BUN 22 22  CREATININE 1.84* 1.89*  CALCIUM 8.2* 8.2*   CBC:  Recent Labs Lab 07/09/12 0440  07/14/12 0400 07/15/12 0057  WBC 11.3*  < > 11.5* 11.5*  NEUTROABS 8.3*  --   --   --   HGB 7.1*  < > 7.3* 6.9*  HCT 21.6*  < > 21.6* 20.5*  MCV 77.4*  < > 77.7* 78.2  PLT 200  < > 238 280  < > = values in this interval not displayed. CBG:  Recent Labs Lab 07/13/12 2218 07/14/12 0739 07/14/12 1203  07/14/12 1332 07/14/12 1710 07/14/12 2208  GLUCAP 82 94 83 85 219* 136*   Urine Drug Screen: Drugs of Abuse     Component Value Date/Time   LABOPIA NONE DETECTED 04/11/2011 1656   COCAINSCRNUR NONE DETECTED 04/11/2011 1656   LABBENZ NONE DETECTED 04/11/2011 1656   AMPHETMU NONE DETECTED 04/11/2011 1656   THCU NONE DETECTED 04/11/2011 1656   LABBARB NONE DETECTED 04/11/2011 1656    Urinalysis:  Recent Labs Lab 07/09/12 0345  COLORURINE AMBER*  LABSPEC 1.017  PHURINE 5.0  GLUCOSEU 250*  HGBUR LARGE*  BILIRUBINUR NEGATIVE  KETONESUR NEGATIVE  PROTEINUR NEGATIVE  UROBILINOGEN 1.0  NITRITE NEGATIVE  LEUKOCYTESUR TRACE*   Micro Results: Recent Results (from the past 240 hour(s))  CULTURE, BLOOD (ROUTINE X 2)     Status: None   Collection Time    07/06/12 10:50 AM      Result Value Range Status   Specimen Description BLOOD LEFT ANTECUBITAL   Final   Special Requests BOTTLES DRAWN AEROBIC ONLY 10CC   Final   Culture  Setup Time 07/06/2012 18:19   Final   Culture NO GROWTH 5 DAYS   Final   Report Status 07/12/2012 FINAL   Final  CULTURE, BLOOD (  ROUTINE X 2)     Status: None   Collection Time    07/06/12 11:00 AM      Result Value Range Status   Specimen Description BLOOD RIGHT ANTECUBITAL   Final   Special Requests BOTTLES DRAWN AEROBIC ONLY 2CC   Final   Culture  Setup Time 07/06/2012 18:19   Final   Culture NO GROWTH 5 DAYS   Final   Report Status 07/12/2012 FINAL   Final  WOUND CULTURE     Status: None   Collection Time    07/06/12  3:11 PM      Result Value Range Status   Specimen Description WOUND THIGH LEFT   Final   Special Requests PT ON ZOSYN,VANCOMYCIN AND CLEOCIN   Final   Gram Stain     Final   Value: RARE WBC PRESENT, PREDOMINANTLY PMN     NO SQUAMOUS EPITHELIAL CELLS SEEN     ABUNDANT GRAM POSITIVE RODS     FEW GRAM POSITIVE COCCI     IN PAIRS IN CLUSTERS   Culture     Final   Value: RARE METHICILLIN RESISTANT STAPHYLOCOCCUS AUREUS     Note:  RIFAMPIN AND GENTAMICIN SHOULD NOT BE USED AS SINGLE DRUGS FOR TREATMENT OF STAPH INFECTIONS. This organism DOES NOT demonstrate inducible Clindamycin resistance in vitro. CRITICAL RESULT CALLED TO, READ BACK BY AND VERIFIED WITH: ROBIN WOFFORD BY      INGRAM A 2/18 8AM   Report Status 07/10/2012 FINAL   Final   Organism ID, Bacteria METHICILLIN RESISTANT STAPHYLOCOCCUS AUREUS   Final  ANAEROBIC CULTURE     Status: None   Collection Time    07/06/12  3:11 PM      Result Value Range Status   Specimen Description WOUND THIGH LEFT   Final   Special Requests PT ON ZOSYN,VANCOMYCIN AND CLEOCIN   Final   Gram Stain     Final   Value: RARE WBC PRESENT, PREDOMINANTLY PMN     NO SQUAMOUS EPITHELIAL CELLS SEEN     ABUNDANT GRAM POSITIVE RODS     FEW GRAM POSITIVE COCCI IN PAIRS     IN CLUSTERS   Culture NO ANAEROBES ISOLATED   Final   Report Status 07/11/2012 FINAL   Final  MRSA PCR SCREENING     Status: Abnormal   Collection Time    07/06/12  8:12 PM      Result Value Range Status   MRSA by PCR POSITIVE (*) NEGATIVE Final   Comment:            The GeneXpert MRSA Assay (FDA     approved for NASAL specimens     only), is one component of a     comprehensive MRSA colonization     surveillance program. It is not     intended to diagnose MRSA     infection nor to guide or     monitor treatment for     MRSA infections.     RESULT CALLED TO, READ BACK BY AND VERIFIED WITH:     S COGGINS RN 2.14.14 AT 2215 BY ROMEROJ   Studies/Results: No results found. Medications: I have reviewed the patient's current medications. Scheduled Meds: . amitriptyline  50 mg Oral QHS  . bd getting started take home kit  1 kit Other Once  . clindamycin (CLEOCIN) IV  600 mg Intravenous Q8H  . collagenase   Topical Daily  . enoxaparin (LOVENOX) injection  40 mg Subcutaneous Q24H  .  FLUoxetine  40 mg Oral Daily  . furosemide  40 mg Intravenous Once  . insulin aspart  0-20 Units Subcutaneous TID WC  . insulin  aspart  0-5 Units Subcutaneous QHS  . insulin aspart  6 Units Subcutaneous TID WC  . insulin glargine  34 Units Subcutaneous QHS  . sodium chloride  10-40 mL Intracatheter Q12H   Continuous Infusions: . sodium chloride 100 mL/hr at 07/14/12 1418   PRN Meds:.HYDROcodone-acetaminophen, HYDROmorphone (DILAUDID) injection, ondansetron (ZOFRAN) IV, sodium chloride Assessment/Plan: 1. Necrotizing fascitis: Pt was admitted on 2/14 and taken emergently to OR for surgical debridement of site suspicious for nec fasc. Over L groin. Pt was then immediately started on Vanc/zosyn/clinda. Repeat debridement of necrotic tissue on 2/15, 2/16, 2/17. Minimal blood loss. Wound and tissue cultures pending. Tissue culture MRSA R PCN/erythromycin and D-test negative. WOC concered that may need another debridement.  -continue Clindamycin and daptomycin today  -surgery following   2. ATN: Pt Cr 1.8 (downtrending and responding to fluids)  today from presumed baseline of 0.7 on admission. Previous UA shows waxy/granular casts and uric acid crystals along with hematuria (RBCs 21-50). Some of hematuria maybe 2/2 traumatic foley insertion.  -continue lasix 40mg  IV and monitor  -continue foley with irrigations -repeat Bmet  3. Acute Anemia: Pt baseline Hgb 10 and today 6.8 yesterday pt received 2 PRBCs post-transfusion pending . Review of intraoperative notes doesn't appear to be etiology of bleeding. No active sites of bleeding.  -monitor CBC -transfuse <7.0  4. Poorly controlled DM type 2: Hgb A1c 15.4 and outpt meds include metformin and glipizide. Pt initially presented in DKA on 2/14 with AG of 24 today AG 12. No ketones in urine.  -cont SSI -carb modified -will need to manage total insulin requirement -DM educator   Dispo: Disposition is deferred at this time, awaiting improvement of current medical problems.  Anticipated discharge in approximately 3-5 day(s).   The patient does have a current PCP  (PRIBULA,CHRISTOPHER, MD), therefore will be requiring OPC follow-up after discharge.   The patient does not have transportation limitations that hinder transportation to clinic appointments.  .Services Needed at time of discharge: Y = Yes, Blank = No PT:   OT:   RN:   Equipment:   Other:     LOS: 9 days   Clinton Gallant 07/15/2012, 8:19 AM [id number]

## 2012-07-16 NOTE — Progress Notes (Signed)
Pt returned from OR in severe pain, but denies nausea. Wants to eat, VSS, Admin pain med, tol clears, resuming diet. Will continue to monitor.

## 2012-07-16 NOTE — Transfer of Care (Signed)
Immediate Anesthesia Transfer of Care Note  Patient: [name] [name]  Procedure(s) Performed: Procedure(s): I&D Left Thigh (Left)  Patient Location: PACU  Anesthesia Type:General  Level of Consciousness: awake, alert , oriented and patient cooperative  Airway & Oxygen Therapy: Patient Spontanous Breathing and Patient connected to nasal cannula oxygen  Post-op Assessment: Report given to PACU RN, Post -op Vital signs reviewed and stable, Patient moving all extremities and grunting in pain.  Post vital signs: Reviewed and stable  Complications: No apparent anesthesia complications

## 2012-07-16 NOTE — Preoperative (Signed)
Beta Blockers   Reason not to administer Beta Blockers:Not Applicable 

## 2012-07-17 ENCOUNTER — Encounter (HOSPITAL_COMMUNITY): Payer: Self-pay | Admitting: General Surgery

## 2012-07-17 LAB — CBC
MCHC: 33.9 g/dL (ref 30.0–36.0)
MCV: 78.4 fL (ref 78.0–100.0)
Platelets: 364 10*3/uL (ref 150–400)
RDW: 15.2 % (ref 11.5–15.5)
WBC: 9.7 10*3/uL (ref 4.0–10.5)

## 2012-07-17 LAB — BASIC METABOLIC PANEL
Calcium: 8.3 mg/dL — ABNORMAL LOW (ref 8.4–10.5)
Chloride: 108 mEq/L (ref 96–112)
Creatinine, Ser: 1.7 mg/dL — ABNORMAL HIGH (ref 0.50–1.10)
GFR calc Af Amer: 44 mL/min — ABNORMAL LOW (ref >90)
GFR calc non Af Amer: 38 mL/min — ABNORMAL LOW (ref >90)

## 2012-07-17 LAB — GLUCOSE, CAPILLARY
Glucose-Capillary: 64 mg/dL — ABNORMAL LOW (ref 70–99)
Glucose-Capillary: 73 mg/dL (ref 70–99)

## 2012-07-17 MED ORDER — PRO-STAT SUGAR FREE PO LIQD
30.0000 mL | Freq: Three times a day (TID) | ORAL | Status: DC
  Administered 2012-07-17 – 2012-07-19 (×6): 30 mL via ORAL
  Filled 2012-07-17: qty 60
  Filled 2012-07-17 (×9): qty 30

## 2012-07-17 MED ORDER — HYDROCODONE-ACETAMINOPHEN 5-325 MG PO TABS
1.0000 | ORAL_TABLET | ORAL | Status: DC
  Administered 2012-07-17 – 2012-07-18 (×9): 2 via ORAL
  Administered 2012-07-19: 1 via ORAL
  Administered 2012-07-19: 2 via ORAL
  Administered 2012-07-19: 1 via ORAL
  Administered 2012-07-19 (×2): 2 via ORAL
  Administered 2012-07-19: 1 via ORAL
  Administered 2012-07-20 (×3): 2 via ORAL
  Administered 2012-07-20: 1 via ORAL
  Administered 2012-07-20 – 2012-07-21 (×4): 2 via ORAL
  Administered 2012-07-21: 1 via ORAL
  Administered 2012-07-21 – 2012-07-22 (×3): 2 via ORAL
  Administered 2012-07-22: 1 via ORAL
  Administered 2012-07-22: 2 via ORAL
  Administered 2012-07-22: 1 via ORAL
  Administered 2012-07-22 – 2012-07-24 (×7): 2 via ORAL
  Administered 2012-07-24 (×4): 1 via ORAL
  Administered 2012-07-24 – 2012-07-25 (×3): 2 via ORAL
  Administered 2012-07-25: 1 via ORAL
  Administered 2012-07-25: 2 via ORAL
  Administered 2012-07-25 (×2): 1 via ORAL
  Administered 2012-07-26: 2 via ORAL
  Administered 2012-07-26: 1 via ORAL
  Administered 2012-07-26 (×2): 2 via ORAL
  Administered 2012-07-26: 1 via ORAL
  Filled 2012-07-17 (×55): qty 2

## 2012-07-17 MED ORDER — ADULT MULTIVITAMIN W/MINERALS CH
1.0000 | ORAL_TABLET | Freq: Every day | ORAL | Status: DC
  Administered 2012-07-17 – 2012-07-26 (×10): 1 via ORAL
  Filled 2012-07-17 (×10): qty 1

## 2012-07-17 MED ORDER — HYDROMORPHONE HCL PF 1 MG/ML IJ SOLN
0.5000 mg | INTRAMUSCULAR | Status: DC | PRN
  Administered 2012-07-17 (×3): 1 mg via INTRAVENOUS
  Administered 2012-07-18 (×2): 2 mg via INTRAVENOUS
  Administered 2012-07-18 (×3): 1 mg via INTRAVENOUS
  Administered 2012-07-19: 2 mg via INTRAVENOUS
  Administered 2012-07-19: 1 mg via INTRAVENOUS
  Filled 2012-07-17 (×2): qty 2
  Filled 2012-07-17: qty 1
  Filled 2012-07-17: qty 2
  Filled 2012-07-17 (×4): qty 1
  Filled 2012-07-17: qty 2
  Filled 2012-07-17 (×2): qty 1

## 2012-07-17 NOTE — Progress Notes (Signed)
ANTIBIOTIC CONSULT NOTE - INITIAL  Pharmacy Consult for Daptomycin Indication: Thigh abscess  No Known Allergies  Labs:  Recent Labs  07/15/12 0057  07/15/12 1700 07/16/12 0321 07/17/12 0420  WBC 11.5*  < > 12.0* 10.1 9.7  HGB 6.9*  < > 8.7* 8.5* 8.5*  PLT 280  < > 319 300 364  CREATININE 1.89*  --   --  1.79* 1.70*  < > = values in this interval not displayed. Estimated Creatinine Clearance: 75.4 ml/min (by C-G formula based on Cr of 1.7). No results found for this basename: VANCOTROUGH, VANCOPEAK, VANCORANDOM, GENTTROUGH, GENTPEAK, GENTRANDOM, TOBRATROUGH, TOBRAPEAK, TOBRARND, AMIKACINPEAK, AMIKACINTROU, AMIKACIN,  in the last 72 hours   Microbiology: Recent Results (from the past 720 hour(s))  CULTURE, BLOOD (ROUTINE X 2)     Status: None   Collection Time    07/06/12 10:50 AM      Result Value Range Status   Specimen Description BLOOD LEFT ANTECUBITAL   Final   Special Requests BOTTLES DRAWN AEROBIC ONLY 10CC   Final   Culture  Setup Time 07/06/2012 18:19   Final   Culture NO GROWTH 5 DAYS   Final   Report Status 07/12/2012 FINAL   Final  CULTURE, BLOOD (ROUTINE X 2)     Status: None   Collection Time    07/06/12 11:00 AM      Result Value Range Status   Specimen Description BLOOD RIGHT ANTECUBITAL   Final   Special Requests BOTTLES DRAWN AEROBIC ONLY 2CC   Final   Culture  Setup Time 07/06/2012 18:19   Final   Culture NO GROWTH 5 DAYS   Final   Report Status 07/12/2012 FINAL   Final  WOUND CULTURE     Status: None   Collection Time    07/06/12  3:11 PM      Result Value Range Status   Specimen Description WOUND THIGH LEFT   Final   Special Requests PT ON ZOSYN,VANCOMYCIN AND CLEOCIN   Final   Gram Stain     Final   Value: RARE WBC PRESENT, PREDOMINANTLY PMN     NO SQUAMOUS EPITHELIAL CELLS SEEN     ABUNDANT GRAM POSITIVE RODS     FEW GRAM POSITIVE COCCI     IN PAIRS IN CLUSTERS   Culture     Final   Value: RARE METHICILLIN RESISTANT STAPHYLOCOCCUS AUREUS      Note: RIFAMPIN AND GENTAMICIN SHOULD NOT BE USED AS SINGLE DRUGS FOR TREATMENT OF STAPH INFECTIONS. This organism DOES NOT demonstrate inducible Clindamycin resistance in vitro. CRITICAL RESULT CALLED TO, READ BACK BY AND VERIFIED WITH: ROBIN WOFFORD BY      INGRAM A 2/18 8AM   Report Status 07/10/2012 FINAL   Final   Organism ID, Bacteria METHICILLIN RESISTANT STAPHYLOCOCCUS AUREUS   Final  ANAEROBIC CULTURE     Status: None   Collection Time    07/06/12  3:11 PM      Result Value Range Status   Specimen Description WOUND THIGH LEFT   Final   Special Requests PT ON ZOSYN,VANCOMYCIN AND CLEOCIN   Final   Gram Stain     Final   Value: RARE WBC PRESENT, PREDOMINANTLY PMN     NO SQUAMOUS EPITHELIAL CELLS SEEN     ABUNDANT GRAM POSITIVE RODS     FEW GRAM POSITIVE COCCI IN PAIRS     IN CLUSTERS   Culture NO ANAEROBES ISOLATED   Final   Report Status 07/11/2012 FINAL  Final  MRSA PCR SCREENING     Status: Abnormal   Collection Time    07/06/12  8:12 PM      Result Value Range Status   MRSA by PCR POSITIVE (*) NEGATIVE Final   Comment:            The GeneXpert MRSA Assay (FDA     approved for NASAL specimens     only), is one component of a     comprehensive MRSA colonization     surveillance program. It is not     intended to diagnose MRSA     infection nor to guide or     monitor treatment for     MRSA infections.     RESULT CALLED TO, READ BACK BY AND VERIFIED WITH:     S COGGINS RN 2.14.14 AT 2215 BY ROMEROJ     Assessment: 37yof on Clindamycin and Daptomycin for thigh abscess/necrotizing fascitis. Patient has had multiple I&Ds, last 2/24. Patient is currently afebrile and WBC 11.5. - Weight: 158.6kg - CrCl ~75 ml/min  Goal of Therapy:  Clinical improvement  Plan:  1. Continue Daptomycin 640mg  (~4mg /kg) IV q24h 2. Check CK now and weekly (next due 3/2)  Thank you. Anette Guarneri, PharmD [phone]  07/17/2012,12:20 PM

## 2012-07-17 NOTE — Progress Notes (Signed)
INITIAL NUTRITION ASSESSMENT  DOCUMENTATION CODES Per approved criteria  -Morbid Obesity   INTERVENTION:  Prostat 30 ml PO TID with meals to maximize protein intake, each 30 ml serving contains 100 kcals and 15 gm protein.  MVI PO daily.  NUTRITION DIAGNOSIS: Increased nutrient needs related to open thigh abscess as evidenced by estimated nutrition needs.   Goal: Intake to meet >90% of estimated nutrition needs to promote wound healing.  Monitor:  PO intake, wound healing, labs, weight trend.  Reason for Assessment: RN Consult for increased protein needs related to large wound.  37 y.o. female  Admitting Dx: Necrotizing fasciitis  ASSESSMENT: Patient has had multiple debridements of left thigh wound/abscess in the OR.  Is now receiving hydrotherapy to open thigh wound/abscess.   Patient with increased protein needs to support wound healing.  Intake of meals is good, however, current diet is only providing 70-95 gm protein daily.  Height: Ht Readings from Last 1 Encounters:  07/06/12 5\' 10"  (1.778 m)    Weight: Wt Readings from Last 1 Encounters:  07/17/12 348 lb 12.3 oz (158.2 kg)    Ideal Body Weight: 68.2 kg  % Ideal Body Weight: 232%  Wt Readings from Last 10 Encounters:  07/17/12 348 lb 12.3 oz (158.2 kg)  07/17/12 348 lb 12.3 oz (158.2 kg)  07/17/12 348 lb 12.3 oz (158.2 kg)  07/17/12 348 lb 12.3 oz (158.2 kg)  07/17/12 348 lb 12.3 oz (158.2 kg)  07/17/12 348 lb 12.3 oz (158.2 kg)  02/20/12 311 lb 3.2 oz (141.159 kg)  01/02/12 310 lb 3.2 oz (140.706 kg)  10/14/11 303 lb 6.4 oz (137.621 kg)  09/02/11 308 lb 11.2 oz (140.025 kg)    Usual Body Weight: 311 lb  % Usual Body Weight: 112%  BMI:  Body mass index is 50.04 kg/(m^2). morbid obesity  Estimated Nutritional Needs: Kcal: 2350 Protein: 145 gm Fluid: 2.4 L  Skin: large open abscess on left thigh with multiple areas of tunneling  Diet Order: Carb Control  EDUCATION NEEDS: -Education needs  addressed--discussed need for adequate protein intake to promote wound healing.   Intake/Output Summary (Last 24 hours) at 07/17/12 1539 Last data filed at 07/17/12 0900  Gross per 24 hour  Intake   1760 ml  Output   4250 ml  Net  -2490 ml    Last BM: 2/23   Labs:   Recent Labs Lab 07/15/12 0057 07/16/12 0321 07/17/12 0420  NA 132* 135 140  K 4.2 3.8 3.9  CL 105 107 108  CO2 19 19 21   BUN 22 20 17   CREATININE 1.89* 1.79* 1.70*  CALCIUM 8.2* 8.1* 8.3*  GLUCOSE 136* 135* 79    CBG (last 3)   Recent Labs  07/17/12 0759 07/17/12 0835 07/17/12 1201  GLUCAP 64* 73 106*    Scheduled Meds: . amitriptyline  50 mg Oral QHS  . bd getting started take home kit  1 kit Other Once  . clindamycin (CLEOCIN) IV  600 mg Intravenous Q8H  . collagenase   Topical Daily  . DAPTOmycin (CUBICIN)  IV  640 mg Intravenous Q24H  . FLUoxetine  40 mg Oral Daily  . furosemide  40 mg Intravenous Q12H  . heparin subcutaneous  5,000 Units Subcutaneous Q8H  . HYDROcodone-acetaminophen  1-2 tablet Oral Q4H  . insulin aspart  0-20 Units Subcutaneous TID WC  . insulin aspart  0-5 Units Subcutaneous QHS  . insulin aspart  6 Units Subcutaneous TID WC  . insulin glargine  34 Units Subcutaneous QHS  . sodium chloride  10-40 mL Intracatheter Q12H    Continuous Infusions: . sodium chloride 100 mL/hr (07/17/12 1432)    Past Medical History  Diagnosis Date  . Diabetes mellitus     Type 2  . Hyperlipidemia   . Hypertension   . Anxiety   . Depression   . Abscess     history of multiple abscesses    Past Surgical History  Procedure Laterality Date  . Incision and drainage abscess      multiple I&Ds  . Irrigation and debridement abscess Left 07/06/2012    Procedure: IRRIGATION AND DEBRIDEMENT ABSCESS BUTTOCKS AND THIGH;  Surgeon: Shann Medal, MD;  Location: [place];  Service: General;  Laterality: Left;  . Incision and drainage of wound Left 07/07/2012    Procedure: IRRIGATION AND  DEBRIDEMENT WOUND;  Surgeon: Harl Bowie, MD;  Location: [place];  Service: General;  Laterality: Left;  . Incision and drainage abscess Left 07/09/2012    Procedure: DRESSING CHANGE, THIGH WOUND;  Surgeon: Harl Bowie, MD;  Location: [place];  Service: General;  Laterality: Left;  . Incision and drainage perirectal abscess Left 07/14/2012    Procedure: DEBRIDEMENT OF SKIN & SOFT TISSUE; DRESSING CHANGE UNDER ANESTHESIA;  Surgeon: Gayland Curry, MD,FACS;  Location: [place];  Service: General;  Laterality: Left;    Molli Barrows, RD, LDN, CNSC Pager# [phone] After Hours Pager# [phone]

## 2012-07-17 NOTE — Consult Note (Signed)
Wound care follow-up: Pt returned to the OR yesterday for further debridement of left perineal area and thigh.  Followed by [facility] team for assessment and plan of care.  This team can determine if they would like to have physical therapy continue hydrotherapy. Will not plan to follow further unless re-consulted.  [address] [place], MSN, [place]

## 2012-07-17 NOTE — Progress Notes (Signed)
Currently there are no plans to send the patient back to the OR for debridement.  Will look at wound again Thursday or Friday.  Kathryne Eriksson. Dahlia Bailiff, MD, [place] [phone] [facility]

## 2012-07-17 NOTE — Progress Notes (Signed)
Physical Therapy Wound Treatment Patient Details  Name: ZENDRE NOMURA MRN: [id number] Date of Birth: 04/08/1976  Today's Date: 07/17/2012 Time: [id number] Time Calculation (min): 77 min  Subjective  Subjective: Pt and Aunt with some concerns over progression today. Patient experiencing more pain today. Patient and Family Stated Goals: heal the wound Date of Onset:  (prior to admission) Prior Treatments: Surgical I&D (multiple)  Pain Score: Pain Score:   7  Wound Assessment     07/17/12 1300  Subjective Assessment  Subjective Pt and Aunt with some concerns over progression today. Patient experiencing more pain today.  Patient and Family Stated Goals heal the wound  Date of Onset (prior to admission)  Prior Treatments Surgical I&D (multiple)  Evaluation and Treatment  Evaluation and Treatment Procedures Explained to Patient/Family Yes  Evaluation and Treatment Procedures agreed to  Wound 07/17/12 Perineum Medial Open abscess perineum and thigh   Date First Assessed/Time First Assessed: 07/17/12 1200   Location: Perineum  Location Orientation: Medial  Wound Description (Comments): Open abscess perineum and thigh   Present on Admission: Yes  Site / Wound Assessment Yellow;Granulation tissue;Pink;Black (Wound connection brideged with thigh)  % Wound base Red or Granulating 60%  % Wound base Yellow 20%  % Wound base Black 10%  % Wound base Other (Comment) 10% (subcutaneous fatty tissue; exposed muscle tissue)  Peri-wound Assessment Erythema (blanchable);Maceration;Pink  Wound Length (cm) 24 cm  Wound Width (cm) 20 cm  Wound Depth (cm) 5 cm  Tunneling (cm) (multiple tunnels will reassess further next visit)  Undermining (cm) 3  Margins Unattacted edges (unapproximated)  Closure None  Drainage Amount Moderate  Drainage Description Serosanguineous  Treatment Debridement (Selective);Hydrotherapy (Pulse lavage);Packing (Saline gauze);Tape changed  Dressing Type ABD;Barrier  Film (skin prep);Gauze (Comment);Moist to dry (santyl)  Dressing Changed Changed  Dressing Status Clean;Intact  Wound 07/17/12 Abdomen Left Necrotizing Faciitis  Date First Assessed: 07/17/12   Location: Abdomen  Location Orientation: Left  Wound Description (Comments): Necrotizing Faciitis  Site / Wound Assessment Brown;Dusky;Granulation tissue;Painful;Pink;Red;Yellow  % Wound base Red or Granulating 65%  % Wound base Yellow 35%  Peri-wound Assessment Erythema (blanchable)  Wound Length (cm) 4 cm  Wound Width (cm) 3.5 cm  Wound Depth (cm) 7 cm  Margins Unattacted edges (unapproximated)  Drainage Amount Minimal  Drainage Description Serosanguineous;Odor  Treatment Debridement (Selective);Hydrotherapy (Pulse lavage);Packing (Saline gauze)  Dressing Type Gauze (Comment);Moist to dry  Dressing Changed Changed  Dressing Status Clean;Intact  Hydrotherapy  Pulsed Lavage with Suction (psi) 4 psi  Pulsed Lavage with Suction - Normal Saline Used 1000 mL  Pulsed Lavage Tip Tip with splash shield  Pulsed lavage therapy - wound location perinieum; and thigh wounds  Selective Debridement  Selective Debridement - Location perineum and thigh wound  Selective Debridement - Tools Used Forceps;Scissors  Selective Debridement - Tissue Removed necrotic tissue and unviable slough  Wound Therapy - Assess/Plan/Recommendations  Wound Therapy - Clinical Statement Wound base presents with significant amounts of red granulation tissue s/p surgical debridement.  Patient wounds with significant depth and demensions. Provided PLS treatment and performed slective sharp debridement at bedside. Dressing was changed and santyl ointment applied for chemical debridement. Patient and aunt expressed concerns over progression of condition and need for multiple surgical interventions.  Educated family on infectious process.    Wound Therapy - Functional Problem List decreased mobility; increased pain, decreased skin itegrity   Factors Delaying/Impairing Wound Healing Altered sensation;Infection - systemic/local;Immobility;Vascular compromise  Hydrotherapy Plan Debridement;Dressing change;Patient/family education;Pulsatile lavage with suction  Wound  Therapy - Frequency 6X / week  Wound Therapy - Follow Up Recommendations Home health RN  Wound Plan continue to treat as indicated  Wound Therapy Goals - Improve the function of patient's integumentary system by progressing the wound(s) through the phases of wound healing by:  Decrease Necrotic Tissue to <25%  Decrease Necrotic Tissue - Progress Goal set today  Increase Granulation Tissue to >75%  Increase Granulation Tissue - Progress Goal set today  Improve Drainage Characteristics Min  Improve Drainage Characteristics - Progress Goal set today  Patient/Family will be able to  demontrate good physiological management contributions (eating well, participating in therapy; understanding of hygiene techniques)  Patient/Family Instruction Goal - Progress Goal set today  Goals/treatment plan/discharge plan were made with and agreed upon by patient/family Yes  Time For Goal Achievement 7 days  Wound Therapy - Potential for Goals Good    Alben Deeds, PT DPT  [phone]

## 2012-07-17 NOTE — Progress Notes (Signed)
Patient ID: [name] [name], female   DOB: 1975-11-19, 37 y.o.   MRN: [id number] 1 Day Post-Op  Subjective: Pt without c/o, except for pain as she just had hydrotherapy  Objective: Vital signs in last 24 hours: Temp:  [97.6 F (36.4 C)-99.2 F (37.3 C)] 99.2 F (37.3 C) (02/25 0738) Pulse Rate:  [96-125] 96 (02/25 0400) Resp:  [12-24] 20 (02/25 0400) BP: (129-194)/(78-114) 139/78 mmHg (02/25 0400) SpO2:  [87 %-100 %] 97 % (02/25 0400) Weight:  [348 lb 12.3 oz (158.2 kg)] 348 lb 12.3 oz (158.2 kg) (02/25 0500) Last BM Date: 07/15/12  Intake/Output from previous day: 02/24 0701 - 02/25 0700 In: 2390 [P.O.:240; I.V.:2150] Out: [id number] [Urine:4975] Intake/Output this shift: Total I/O In: 520 [P.O.:320; I.V.:200] Out: 1100 [Urine:1100]  PE: Abd: dressed, hydrotherapy just covered wound.  Unable to look at it today  Lab Results:   Recent Labs  07/16/12 0321 07/17/12 0420  WBC 10.1 9.7  HGB 8.5* 8.5*  HCT 25.2* 25.1*  PLT 300 364   BMET  Recent Labs  07/16/12 0321 07/17/12 0420  NA 135 140  K 3.8 3.9  CL 107 108  CO2 19 21  GLUCOSE 135* 79  BUN 20 17  CREATININE 1.79* 1.70*  CALCIUM 8.1* 8.3*   PT/INR No results found for this basename: LABPROT, INR,  in the last 72 hours CMP     Component Value Date/Time   NA 140 07/17/2012 0420   K 3.9 07/17/2012 0420   CL 108 07/17/2012 0420   CO2 21 07/17/2012 0420   GLUCOSE 79 07/17/2012 0420   BUN 17 07/17/2012 0420   CREATININE 1.70* 07/17/2012 0420   CALCIUM 8.3* 07/17/2012 0420   PROT 6.5 07/15/2012 1700   ALBUMIN 1.3* 07/15/2012 1700   AST 12 07/15/2012 1700   ALT 7 07/15/2012 1700   ALKPHOS 109 07/15/2012 1700   BILITOT 0.2* 07/15/2012 1700   GFRNONAA 38* 07/17/2012 0420   GFRAA 44* 07/17/2012 0420   Lipase  No results found for this basename: lipase       Studies/Results: No results found.  Anti-infectives: Anti-infectives   Start     Dose/Rate Route Frequency Ordered Stop   07/15/12 0830  DAPTOmycin  (CUBICIN) 640 mg in sodium chloride 0.9 % IVPB     640 mg 225.6 mL/hr over 30 Minutes Intravenous Every 24 hours 07/15/12 0832     07/09/12 1800  vancomycin (VANCOCIN) 2,000 mg in sodium chloride 0.9 % 500 mL IVPB  Status:  Discontinued     2,000 mg 250 mL/hr over 120 Minutes Intravenous Every 24 hours 07/08/12 2308 07/10/12 1132   07/07/12 1000  vancomycin (VANCOCIN) 1,250 mg in sodium chloride 0.9 % 250 mL IVPB  Status:  Discontinued     1,250 mg 166.7 mL/hr over 90 Minutes Intravenous Every 12 hours 07/06/12 2110 07/08/12 2251   07/07/12 0030  clindamycin (CLEOCIN) IVPB 600 mg     600 mg 100 mL/hr over 30 Minutes Intravenous 3 times per day 07/07/12 0019     07/06/12 2200  linezolid (ZYVOX) IVPB 600 mg  Status:  Discontinued     600 mg 300 mL/hr over 60 Minutes Intravenous Every 12 hours 07/06/12 1612 07/06/12 2050   07/06/12 2200  piperacillin-tazobactam (ZOSYN) IVPB 3.375 g  Status:  Discontinued     3.375 g 12.5 mL/hr over 240 Minutes Intravenous 3 times per day 07/06/12 1646 07/11/12 0751   07/06/12 2115  vancomycin (VANCOCIN) 2,000 mg in sodium chloride 0.9 %  500 mL IVPB     2,000 mg 250 mL/hr over 120 Minutes Intravenous  Once 07/06/12 2110 07/07/12 0042   07/06/12 2100  vancomycin (VANCOCIN) IVPB 1000 mg/200 mL premix  Status:  Discontinued     1,000 mg 200 mL/hr over 60 Minutes Intravenous  Once 07/06/12 2050 07/06/12 2109   07/06/12 1830  piperacillin-tazobactam (ZOSYN) IVPB 3.375 g  Status:  Discontinued     3.375 g 100 mL/hr over 30 Minutes Intravenous 4 times per day 07/06/12 1825 07/06/12 1858   07/06/12 1830  vancomycin (VANCOCIN) IVPB 1000 mg/200 mL premix  Status:  Discontinued     1,000 mg 200 mL/hr over 60 Minutes Intravenous Every 12 hours 07/06/12 1825 07/06/12 1920   07/06/12 1615  linezolid (ZYVOX) IVPB 600 mg  Status:  Discontinued     600 mg 300 mL/hr over 60 Minutes Intravenous Every 12 hours 07/06/12 1610 07/06/12 1619   07/06/12 1300   piperacillin-tazobactam (ZOSYN) IVPB 3.375 g     3.375 g 100 mL/hr over 30 Minutes Intravenous  Once 07/06/12 1250 07/06/12 1450   07/06/12 1300  clindamycin (CLEOCIN) IVPB 600 mg     600 mg 100 mL/hr over 30 Minutes Intravenous  Once 07/06/12 1253 07/06/12 1352   07/06/12 1130  vancomycin (VANCOCIN) IVPB 1000 mg/200 mL premix     1,000 mg 200 mL/hr over 60 Minutes Intravenous  Once 07/06/12 1117 07/06/12 1230       Assessment/Plan  1.  Necrotizing fasciitis, s/p multiple debridements  Plan: 1. The patient's wound actually looked pretty good yesterday in the OR.  Doubt she will need any further debridements or trips to the OR this week and hopefully no further ones will be needed at all.  She is tolerating hydrotherapy and dressing changes at the bedside and should not require trips to the OR for dressing changes either.  I will have hydrotherapy call me tomorrow so I can evaluate the wound. 2. Cont abx therapy 3. Patient is stable for transfer to the floor from our standpoint.  LOS: 11 days    Tellis Spivak E 07/17/2012, 1:51 PM Pager: [phone]

## 2012-07-17 NOTE — Progress Notes (Signed)
Patient Blood Glucose level-64 - Hypoglycemic protocol followed [id number]) Patients blood glucose reassess at 0835 after breakfast- level-73  Continue to monitor.

## 2012-07-17 NOTE — Progress Notes (Signed)
Subjective: Pt remains afebrile and VSS. Pt POD #1 from re-debridement still having some pain.   Objective: Vital signs in last 24 hours: Filed Vitals:   07/15/12 0530 07/15/12 0545 07/15/12 0620 07/15/12 0732  BP: 119/70 127/75 104/61   Pulse: 100 99    Temp: 98.2 F (36.8 C) 98.1 F (36.7 C) 98.4 F (36.9 C) 98.7 F (37.1 C)  TempSrc: Oral Oral Oral Oral  Resp: 23 15    Height:      Weight:      SpO2:       Weight change:   Intake/Output Summary (Last 24 hours) at 07/15/12 0819 Last data filed at 07/15/12 [id number]  Gross per 24 hour  Intake   2955 ml  Output   3140 ml  Net   -185 ml   General: resting in bed,NAD, anxious HEENT: PERRL, EOMI, no scleral icterus Cardiac: RRR, no rubs, murmurs or gallops Pulm: clear to auscultation bilaterally, moving normal volumes of air Abd: soft, nontender, nondistended, BS present Ext: warm and well perfused, no pedal edema, scattered pustules over face and UE all different stages of healing, left groin dressing from medial thigh to left labial draining serosanginous fluid, ttp, no crepitus, but increasing induration around bandage borders, foley with sediment but no gross hematuria Neuro: alert and oriented X3, cranial nerves II-XII grossly intact, depressed affect  Lab Results: Basic Metabolic Panel:  Recent Labs Lab 07/14/12 0400 07/15/12 0057  NA 135 132*  K 4.0 4.2  CL 107 105  CO2 18* 19  GLUCOSE 80 136*  BUN 22 22  CREATININE 1.84* 1.89*  CALCIUM 8.2* 8.2*   CBC:  Recent Labs Lab 07/09/12 0440  07/14/12 0400 07/15/12 0057  WBC 11.3*  < > 11.5* 11.5*  NEUTROABS 8.3*  --   --   --   HGB 7.1*  < > 7.3* 6.9*  HCT 21.6*  < > 21.6* 20.5*  MCV 77.4*  < > 77.7* 78.2  PLT 200  < > 238 280  < > = values in this interval not displayed. CBG:  Recent Labs Lab 07/13/12 2218 07/14/12 0739 07/14/12 1203 07/14/12 1332 07/14/12 1710 07/14/12 2208  GLUCAP 82 94 83 85 219* 136*   Urine Drug Screen: Drugs of Abuse      Component Value Date/Time   LABOPIA NONE DETECTED 04/11/2011 1656   COCAINSCRNUR NONE DETECTED 04/11/2011 1656   LABBENZ NONE DETECTED 04/11/2011 1656   AMPHETMU NONE DETECTED 04/11/2011 1656   THCU NONE DETECTED 04/11/2011 1656   LABBARB NONE DETECTED 04/11/2011 1656    Urinalysis:  Recent Labs Lab 07/09/12 0345  COLORURINE AMBER*  LABSPEC 1.017  PHURINE 5.0  GLUCOSEU 250*  HGBUR LARGE*  BILIRUBINUR NEGATIVE  KETONESUR NEGATIVE  PROTEINUR NEGATIVE  UROBILINOGEN 1.0  NITRITE NEGATIVE  LEUKOCYTESUR TRACE*   Micro Results: Recent Results (from the past 240 hour(s))  CULTURE, BLOOD (ROUTINE X 2)     Status: None   Collection Time    07/06/12 10:50 AM      Result Value Range Status   Specimen Description BLOOD LEFT ANTECUBITAL   Final   Special Requests BOTTLES DRAWN AEROBIC ONLY 10CC   Final   Culture  Setup Time 07/06/2012 18:19   Final   Culture NO GROWTH 5 DAYS   Final   Report Status 07/12/2012 FINAL   Final  CULTURE, BLOOD (ROUTINE X 2)     Status: None   Collection Time    07/06/12 11:00 AM  Result Value Range Status   Specimen Description BLOOD RIGHT ANTECUBITAL   Final   Special Requests BOTTLES DRAWN AEROBIC ONLY 2CC   Final   Culture  Setup Time 07/06/2012 18:19   Final   Culture NO GROWTH 5 DAYS   Final   Report Status 07/12/2012 FINAL   Final  WOUND CULTURE     Status: None   Collection Time    07/06/12  3:11 PM      Result Value Range Status   Specimen Description WOUND THIGH LEFT   Final   Special Requests PT ON ZOSYN,VANCOMYCIN AND CLEOCIN   Final   Gram Stain     Final   Value: RARE WBC PRESENT, PREDOMINANTLY PMN     NO SQUAMOUS EPITHELIAL CELLS SEEN     ABUNDANT GRAM POSITIVE RODS     FEW GRAM POSITIVE COCCI     IN PAIRS IN CLUSTERS   Culture     Final   Value: RARE METHICILLIN RESISTANT STAPHYLOCOCCUS AUREUS     Note: RIFAMPIN AND GENTAMICIN SHOULD NOT BE USED AS SINGLE DRUGS FOR TREATMENT OF STAPH INFECTIONS. This organism DOES NOT  demonstrate inducible Clindamycin resistance in vitro. CRITICAL RESULT CALLED TO, READ BACK BY AND VERIFIED WITH: ROBIN WOFFORD BY      INGRAM A 2/18 8AM   Report Status 07/10/2012 FINAL   Final   Organism ID, Bacteria METHICILLIN RESISTANT STAPHYLOCOCCUS AUREUS   Final  ANAEROBIC CULTURE     Status: None   Collection Time    07/06/12  3:11 PM      Result Value Range Status   Specimen Description WOUND THIGH LEFT   Final   Special Requests PT ON ZOSYN,VANCOMYCIN AND CLEOCIN   Final   Gram Stain     Final   Value: RARE WBC PRESENT, PREDOMINANTLY PMN     NO SQUAMOUS EPITHELIAL CELLS SEEN     ABUNDANT GRAM POSITIVE RODS     FEW GRAM POSITIVE COCCI IN PAIRS     IN CLUSTERS   Culture NO ANAEROBES ISOLATED   Final   Report Status 07/11/2012 FINAL   Final  MRSA PCR SCREENING     Status: Abnormal   Collection Time    07/06/12  8:12 PM      Result Value Range Status   MRSA by PCR POSITIVE (*) NEGATIVE Final   Comment:            The GeneXpert MRSA Assay (FDA     approved for NASAL specimens     only), is one component of a     comprehensive MRSA colonization     surveillance program. It is not     intended to diagnose MRSA     infection nor to guide or     monitor treatment for     MRSA infections.     RESULT CALLED TO, READ BACK BY AND VERIFIED WITH:     S COGGINS RN 2.14.14 AT 2215 BY ROMEROJ   Studies/Results: No results found. Medications: I have reviewed the patient's current medications. Scheduled Meds: . amitriptyline  50 mg Oral QHS  . bd getting started take home kit  1 kit Other Once  . clindamycin (CLEOCIN) IV  600 mg Intravenous Q8H  . collagenase   Topical Daily  . enoxaparin (LOVENOX) injection  40 mg Subcutaneous Q24H  . FLUoxetine  40 mg Oral Daily  . furosemide  40 mg Intravenous Once  . insulin aspart  0-20 Units Subcutaneous  TID WC  . insulin aspart  0-5 Units Subcutaneous QHS  . insulin aspart  6 Units Subcutaneous TID WC  . insulin glargine  34 Units  Subcutaneous QHS  . sodium chloride  10-40 mL Intracatheter Q12H   Continuous Infusions: . sodium chloride 100 mL/hr at 07/14/12 1418   PRN Meds:.HYDROcodone-acetaminophen, HYDROmorphone (DILAUDID) injection, ondansetron (ZOFRAN) IV, sodium chloride Assessment/Plan: 1. Necrotizing fascitis: Pt was admitted on 2/14 and taken emergently to OR for surgical debridement of site suspicious for nec fasc. Over L groin. Pt was then immediately started on Vanc/zosyn/clinda. Repeat debridement of necrotic tissue on 2/15, 2/16, 2/17. Minimal blood loss. Wound and tissue cultures pending. Tissue culture MRSA R PCN/erythromycin and D-test negative.  -continue Clindamycin and daptomycin today  -surgery following   2. ATN: Pt Cr 1.7 (downtrending and responding to fluids)  today from presumed baseline of 0.7 on admission. Previous UA shows waxy/granular casts and uric acid crystals along with hematuria (RBCs 21-50). Some of hematuria maybe 2/2 traumatic foley insertion.  -continue lasix 40mg  IV and monitor  -continue foley with irrigations -repeat Bmet  3. Acute Anemia: Pt baseline Hgb 10 and today 6.8 yesterday pt received 2 PRBCs post-transfusion pending . Review of intraoperative notes doesn't appear to be etiology of bleeding. No active sites of bleeding.  -monitor CBC -transfuse <7.0  4. Poorly controlled DM type 2: Hgb A1c 15.4 and outpt meds include metformin and glipizide. Pt initially presented in DKA on 2/14 with AG of 24 today AG 12. No ketones in urine.  -cont SSI -carb modified -will need to manage total insulin requirement -DM educator   Dispo: Disposition is deferred at this time, awaiting improvement of current medical problems.  Anticipated discharge in approximately 3-5 day(s).   The patient does have a current PCP (PRIBULA,CHRISTOPHER, MD), therefore will be requiring OPC follow-up after discharge.   The patient does not have transportation limitations that hinder transportation to  clinic appointments.  .Services Needed at time of discharge: Y = Yes, Blank = No PT:   OT:   RN:   Equipment:   Other:     LOS: 9 days   Clinton Gallant 07/15/2012, 8:19 AM [id number]

## 2012-07-17 NOTE — Progress Notes (Signed)
Spoke with patient and aunt (with patients permission) regarding care.  Aunt has been concerned with heart and kidney function due to fluid administration.  I discussed rationale.  I also discussed at length the severity of the patients condition with the toxin producing organism and the treatment requirements of surgery.   All questions answered.

## 2012-07-18 LAB — BASIC METABOLIC PANEL
Calcium: 8.2 mg/dL — ABNORMAL LOW (ref 8.4–10.5)
Creatinine, Ser: 1.73 mg/dL — ABNORMAL HIGH (ref 0.50–1.10)
GFR calc non Af Amer: 37 mL/min — ABNORMAL LOW (ref >90)
Sodium: 137 mEq/L (ref 135–145)

## 2012-07-18 LAB — GLUCOSE, CAPILLARY
Glucose-Capillary: 78 mg/dL (ref 70–99)
Glucose-Capillary: 148 mg/dL — ABNORMAL HIGH (ref 70–99)
Glucose-Capillary: 107 mg/dL — ABNORMAL HIGH (ref 70–99)
Glucose-Capillary: 130 mg/dL — ABNORMAL HIGH (ref 70–99)
Glucose-Capillary: 63 mg/dL — ABNORMAL LOW (ref 70–99)
Glucose-Capillary: 64 mg/dL — ABNORMAL LOW (ref 70–99)
Glucose-Capillary: 81 mg/dL (ref 70–99)
Glucose-Capillary: 95 mg/dL (ref 70–99)

## 2012-07-18 LAB — CBC
MCH: 27.1 pg (ref 26.0–34.0)
MCHC: 34.3 g/dL (ref 30.0–36.0)
MCV: 79 fL (ref 78.0–100.0)
Platelets: 321 10*3/uL (ref 150–400)
RBC: 2.91 MIL/uL — ABNORMAL LOW (ref 3.87–5.11)
RDW: 15 % (ref 11.5–15.5)

## 2012-07-18 MED ORDER — GLUCOSE 40 % PO GEL
ORAL | Status: AC
  Filled 2012-07-18: qty 1

## 2012-07-18 MED ORDER — INSULIN GLARGINE 100 UNIT/ML ~~LOC~~ SOLN
30.0000 [IU] | Freq: Every day | SUBCUTANEOUS | Status: DC
  Administered 2012-07-18: 30 [IU] via SUBCUTANEOUS

## 2012-07-18 MED ORDER — HYDROMORPHONE HCL PF 1 MG/ML IJ SOLN
1.0000 mg | Freq: Once | INTRAMUSCULAR | Status: AC
  Administered 2012-07-18: 1 mg via INTRAVENOUS

## 2012-07-18 NOTE — Progress Notes (Signed)
[facility] Progress Note 2 Days Post-Op  Subjective: Patient still having a lot of pain in her left leg  Objective: Vital signs in last 24 hours: Temp:  [97.9 F (36.6 C)-98.5 F (36.9 C)] 98.4 F (36.9 C) (02/26 0700) Pulse Rate:  [80] 80 (02/25 1909) Resp:  [12-20] 16 (02/26 0400) BP: (116-129)/(64-79) 126/79 mmHg (02/26 0400) SpO2:  [94 %-97 %] 96 % (02/26 0400) Last BM Date: 07/17/12  Intake/Output from previous day: 02/25 0701 - 02/26 0700 In: 3520 [P.O.:1220; I.V.:2300] Out: [id number] [Urine:4050] Intake/Output this shift: Total I/O In: -  Out: 1250 [Urine:1250]  General: No acute distress  Lungs: Clear  Abd: Benign  Extremities: Did not visualize wound. Will see if PA can come by an see when hydrotherapy done at about 10.  Neuro: Intact  Lab Results:  @LABLAST2 (wbc:2,hgb:2,hct:2,plt:2) BMET  Recent Labs  07/17/12 0420 07/18/12 0433  NA 140 137  K 3.9 3.7  CL 108 107  CO2 21 23  GLUCOSE 79 66*  BUN 17 17  CREATININE 1.70* 1.73*  CALCIUM 8.3* 8.2*   PT/INR No results found for this basename: LABPROT, INR,  in the last 72 hours ABG No results found for this basename: PHART, PCO2, PO2, HCO3,  in the last 72 hours  Studies/Results: No results found.  Anti-infectives: Anti-infectives   Start     Dose/Rate Route Frequency Ordered Stop   07/15/12 0830  DAPTOmycin (CUBICIN) 640 mg in sodium chloride 0.9 % IVPB     640 mg 225.6 mL/hr over 30 Minutes Intravenous Every 24 hours 07/15/12 0832     07/09/12 1800  vancomycin (VANCOCIN) 2,000 mg in sodium chloride 0.9 % 500 mL IVPB  Status:  Discontinued     2,000 mg 250 mL/hr over 120 Minutes Intravenous Every 24 hours 07/08/12 2308 07/10/12 1132   07/07/12 1000  vancomycin (VANCOCIN) 1,250 mg in sodium chloride 0.9 % 250 mL IVPB  Status:  Discontinued     1,250 mg 166.7 mL/hr over 90 Minutes Intravenous Every 12 hours 07/06/12 2110 07/08/12 2251   07/07/12 0030  clindamycin (CLEOCIN) IVPB 600 mg     600  mg 100 mL/hr over 30 Minutes Intravenous 3 times per day 07/07/12 0019     07/06/12 2200  linezolid (ZYVOX) IVPB 600 mg  Status:  Discontinued     600 mg 300 mL/hr over 60 Minutes Intravenous Every 12 hours 07/06/12 1612 07/06/12 2050   07/06/12 2200  piperacillin-tazobactam (ZOSYN) IVPB 3.375 g  Status:  Discontinued     3.375 g 12.5 mL/hr over 240 Minutes Intravenous 3 times per day 07/06/12 1646 07/11/12 0751   07/06/12 2115  vancomycin (VANCOCIN) 2,000 mg in sodium chloride 0.9 % 500 mL IVPB     2,000 mg 250 mL/hr over 120 Minutes Intravenous  Once 07/06/12 2110 07/07/12 0042   07/06/12 2100  vancomycin (VANCOCIN) IVPB 1000 mg/200 mL premix  Status:  Discontinued     1,000 mg 200 mL/hr over 60 Minutes Intravenous  Once 07/06/12 2050 07/06/12 2109   07/06/12 1830  piperacillin-tazobactam (ZOSYN) IVPB 3.375 g  Status:  Discontinued     3.375 g 100 mL/hr over 30 Minutes Intravenous 4 times per day 07/06/12 1825 07/06/12 1858   07/06/12 1830  vancomycin (VANCOCIN) IVPB 1000 mg/200 mL premix  Status:  Discontinued     1,000 mg 200 mL/hr over 60 Minutes Intravenous Every 12 hours 07/06/12 1825 07/06/12 1920   07/06/12 1615  linezolid (ZYVOX) IVPB 600 mg  Status:  Discontinued     600 mg 300 mL/hr over 60 Minutes Intravenous Every 12 hours 07/06/12 1610 07/06/12 1619   07/06/12 1300  piperacillin-tazobactam (ZOSYN) IVPB 3.375 g     3.375 g 100 mL/hr over 30 Minutes Intravenous  Once 07/06/12 1250 07/06/12 1450   07/06/12 1300  clindamycin (CLEOCIN) IVPB 600 mg     600 mg 100 mL/hr over 30 Minutes Intravenous  Once 07/06/12 1253 07/06/12 1352   07/06/12 1130  vancomycin (VANCOCIN) IVPB 1000 mg/200 mL premix     1,000 mg 200 mL/hr over 60 Minutes Intravenous  Once 07/06/12 1117 07/06/12 1230      Assessment/Plan: s/p Procedure(s): I&D Left Thigh Continue current management.  Possibly can get a VAC dressing on this area if it gets a bit smaller.  LOS: 12 days   Kathryne Eriksson. Dahlia Bailiff,  MD, FACS [phone] [facility] Surgery 07/18/2012

## 2012-07-18 NOTE — Progress Notes (Signed)
Subjective: Pt doing well this AM. VSS not febrile. Hgb holding at 7.9 and Cr continues to respond to fluid. Pt down 1 lb from yesterday. Surgery feels pt can move out of SDU.  Objective: Vital signs in last 24 hours: Filed Vitals:   07/17/12 1909 07/17/12 2000 07/18/12 0000 07/18/12 0400  BP: 117/69 126/79 116/64 126/79  Pulse: 80     Temp: 98.5 F (36.9 C) 98.3 F (36.8 C) 98 F (36.7 C) 97.9 F (36.6 C)  TempSrc: Oral Oral Oral Oral  Resp: 18 20 16 16   Height:      Weight:      SpO2: 97% 94% 94% 96%   Weight change:   Intake/Output Summary (Last 24 hours) at 07/18/12 0803 Last data filed at 07/18/12 0600  Gross per 24 hour  Intake   3420 ml  Output   4050 ml  Net   -630 ml   General: resting in bed,NAD, anxious  HEENT: PERRL, EOMI, no scleral icterus  Cardiac: RRR, no rubs, murmurs or gallops  Pulm: clear to auscultation bilaterally, moving normal volumes of air  Abd: soft, nontender, nondistended, BS present  Ext: warm and well perfused, no pedal edema, scattered pustules over face and UE all different stages of healing, left groin dressing from medial thigh to left labial draining serosanginous fluid, ttp, no crepitus, but increasing induration around bandage borders, foley with sediment but no gross hematuria  Neuro: alert and oriented X3, cranial nerves II-XII grossly intact, depressed affect  Lab Results: Basic Metabolic Panel:  Recent Labs Lab 07/17/12 0420 07/18/12 0433  NA 140 137  K 3.9 3.7  CL 108 107  CO2 21 23  GLUCOSE 79 66*  BUN 17 17  CREATININE 1.70* 1.73*  CALCIUM 8.3* 8.2*   Liver Function Tests:  Recent Labs Lab 07/15/12 1700  AST 12  ALT 7  ALKPHOS 109  BILITOT 0.2*  PROT 6.5  ALBUMIN 1.3*   CBC:  Recent Labs Lab 07/17/12 0420 07/18/12 0433  WBC 9.7 8.8  HGB 8.5* 7.9*  HCT 25.1* 23.0*  MCV 78.4 79.0  PLT 364 321   Cardiac Enzymes:  Recent Labs Lab 07/15/12 0955  CKTOTAL 168   CBG:  Recent Labs Lab  07/16/12 1625 07/16/12 2132 07/17/12 0759 07/17/12 0835 07/17/12 1201 07/17/12 2212  GLUCAP 101* 140* 64* 73 106* 148*   Micro Results: No results found for this or any previous visit (from the past 240 hour(s)). Studies/Results: No results found. Medications: I have reviewed the patient's current medications. Scheduled Meds: . amitriptyline  50 mg Oral QHS  . bd getting started take home kit  1 kit Other Once  . clindamycin (CLEOCIN) IV  600 mg Intravenous Q8H  . collagenase   Topical Daily  . DAPTOmycin (CUBICIN)  IV  640 mg Intravenous Q24H  . feeding supplement  30 mL Oral TID WC  . FLUoxetine  40 mg Oral Daily  . furosemide  40 mg Intravenous Q12H  . heparin subcutaneous  5,000 Units Subcutaneous Q8H  . HYDROcodone-acetaminophen  1-2 tablet Oral Q4H  . insulin aspart  0-20 Units Subcutaneous TID WC  . insulin aspart  0-5 Units Subcutaneous QHS  . insulin aspart  6 Units Subcutaneous TID WC  . insulin glargine  30 Units Subcutaneous QHS  . multivitamin with minerals  1 tablet Oral Daily  . sodium chloride  10-40 mL Intracatheter Q12H   Continuous Infusions: . sodium chloride 100 mL/hr (07/17/12 1432)   PRN Meds:.diphenhydrAMINE,  HYDROmorphone (DILAUDID) injection, ondansetron (ZOFRAN) IV, sodium chloride Assessment/Plan: 1. Necrotizing fascitis: Pt was admitted on 2/14 and taken emergently to OR for surgical debridement of site suspicious for nec fasc. Over L groin. Pt was then immediately started on Vanc/zosyn/clinda. Repeat debridement of necrotic tissue on 2/15, 2/16, 2/17. Minimal blood loss. Wound and tissue cultures pending. Tissue culture MRSA R PCN/erythromycin and D-test negative.  -continue Clindamycin and daptomycin today  -surgery following   2. ATN resolving : Pt Cr 1.7 (downtrending and responding to fluids) today from presumed baseline of 0.7 on admission. Previous UA shows waxy/granular casts and uric acid crystals along with hematuria (RBCs 21-50). Some  of hematuria maybe 2/2 traumatic foley insertion.  -continue lasix 40mg  IV BID and monitor  -continue foley with irrigations  -repeat Bmet   3. Acute Anemia: Pt baseline Hgb 10 and today 6.8 yesterday pt received 2 PRBCs post-transfusion pending . Review of intraoperative notes doesn't appear to be etiology of bleeding. No active sites of bleeding.  -monitor CBC  -transfuse <7.0   4. Poorly controlled DM type 2: Hgb A1c 15.4 and outpt meds include metformin and glipizide. Pt initially presented in DKA on 2/14 with AG of 24 that resolved after fluids. No ketones in urine.  -cont SSI  -carb modified  -will need to manage total insulin requirement  -DM educator   Dispo: Disposition is deferred at this time, awaiting improvement of current medical problems.  Anticipated discharge in approximately 5-7 day(s).   The patient does have a current PCP (PRIBULA,CHRISTOPHER, MD), therefore will be requiring OPC follow-up after discharge.   The patient does not have transportation limitations that hinder transportation to clinic appointments.  .Services Needed at time of discharge: Y = Yes, Blank = No PT:   OT:   RN:   Equipment:   Other:     LOS: 12 days   Clinton Gallant 07/18/2012, 8:03 AM [id number]

## 2012-07-18 NOTE — Progress Notes (Addendum)
Received pt. As a transfer from 3300.pt. Alert and oriented.pt. Has lt. thigh wound.pt. Uses a walker to ambulates.keep monitoring pt. Closely.

## 2012-07-18 NOTE — Clinical Social Work Psychosocial (Signed)
     Clinical Social Work Department BRIEF PSYCHOSOCIAL ASSESSMENT 07/18/2012  Patient:  [name] [name], [name] [name]     Account Number:  [id number]     Admit date:  07/06/2012  Clinical Social Worker:  Valda Lamb  Date/Time:  07/18/2012 09:47 AM  Referred by:  RN  Date Referred:  07/18/2012 Referred for  Other - See comment   Other Referral:   Pt has questions regarding Medicaid and disability.   Interview type:  Patient Other interview type:    PSYCHOSOCIAL DATA Living Status:  PARENTS Admitted from facility:   Level of care:   Primary support name:  [name] [name] [id number] Primary support relationship to patient:  PARENT Degree of support available:   Pt reports mother is a support system to her.    CURRENT CONCERNS Current Concerns  Other - See comment   Other Concerns:   Questions regarding Medicaid/ Disability.    SOCIAL WORK ASSESSMENT / PLAN CSW informed by RN that pt requesting information on Medicaid and disability. CSW observed that pt presents with multiple medical problems. CSW contacted the financial counselor who informed CSW that pt would be seen today discuss Medicaid/disability.    CSW visited pt room and intoduced herself and role. Pt informed CSW she would like to discuss Medicaid/disability. CSW informed pt that a financial counselor would visit pt today to discuss pt request. Pt very appreciative and stated she had no additional questions or concerns for CSW. Pt will await to speak to financial counelor.    CSW encouraged pt to request to speak to CSW if any CSW needs arise.   Assessment/plan status:  No Further Intervention Required Other assessment/ plan:   Information/referral to community resources:   CSW contacted the financial counselor who will visit pt to day to discuss Medicaid/disability as pt might be an appropriate candidate for a Medicaid application to be done in the hospital.    CSW available for additional resources if  needed.    PATIENTS/FAMILYS RESPONSE TO PLAN OF CARE: Pt laying in bed alert and oriented. Pt requesting to speak to financial counselor to discuss questions regarding Medicaid/disability. Pt denied any questions for CSW.    CSW signing off.

## 2012-07-18 NOTE — Progress Notes (Signed)
PT HYDROTHERAPY PROGRESS NOTE    07/18/12 1300  Subjective Assessment  Subjective Pt very pleasent; patient expressed concerns over anxiety and fear; but states that she is very appreciative    Patient and Family Stated Goals heal the wound  Date of Onset (prior to admission)  Prior Treatments Surgical I&D (multiple)  Evaluation and Treatment  Evaluation and Treatment Procedures Explained to Patient/Family Yes  Evaluation and Treatment Procedures agreed to  Wound 07/17/12 Perineum Medial Open abscess perineum and thigh   Date First Assessed/Time First Assessed: 07/17/12 1200   Location: Perineum  Location Orientation: Medial  Wound Description (Comments): Open abscess perineum and thigh   Present on Admission: Yes  Site / Wound Assessment Yellow;Granulation tissue;Pink;Black (Wound connection brideged with thigh)  % Wound base Red or Granulating 60%  % Wound base Yellow 20%  % Wound base Black 10%  % Wound base Other (Comment) 10% (subcutaneous fatty tissue; exposed muscle tissue)  Peri-wound Assessment Erythema (blanchable);Maceration;Pink  Margins Unattacted edges (unapproximated)  Closure None  Drainage Amount Moderate  Drainage Description Serosanguineous  Treatment Debridement (Selective);Hydrotherapy (Pulse lavage);Packing (Saline gauze);Tape changed  Dressing Type ABD;Barrier Film (skin prep);Gauze (Comment);Moist to dry (santyl)  Dressing Changed Changed  Dressing Status Clean;Intact  Wound 07/17/12 Abdomen Left Necrotizing Faciitis  Date First Assessed: 07/17/12   Location: Abdomen  Location Orientation: Left  Wound Description (Comments): Necrotizing Faciitis  Site / Wound Assessment Brown;Dusky;Granulation tissue;Painful;Pink;Red;Yellow  % Wound base Red or Granulating 65%  % Wound base Yellow 35%  Peri-wound Assessment Erythema (blanchable)  Margins Unattacted edges (unapproximated)  Drainage Amount Minimal  Drainage Description Serosanguineous;Odor  Treatment  Debridement (Selective);Hydrotherapy (Pulse lavage);Packing (Saline gauze)  Dressing Type Gauze (Comment);Moist to dry  Dressing Changed Changed  Dressing Status Clean;Intact  Hydrotherapy  Pulsed Lavage with Suction (psi) 4 psi  Pulsed Lavage with Suction - Normal Saline Used 2000 mL  Pulsed Lavage Tip Tip with splash shield  Pulsed lavage therapy - wound location perinieum; and thigh wounds  Selective Debridement  Selective Debridement - Location perineum and thigh wound  Selective Debridement - Tools Used Forceps;Scissors  Selective Debridement - Tissue Removed necrotic tissue and unviable slough  Wound Therapy - Assess/Plan/Recommendations  Wound Therapy - Clinical Statement Wounds received 2 bags of NS. Patient wounds with significant depth and demensions. Necortic slough appears looser today. Able to debride some portions; chemical agent applied. Provided PLS treatment and performed slective sharp debridement at bedside. Dressing was changed and santyl ointment applied for chemical debridement. Patient and aunt expressed concerns over progression of condition and need for multiple surgical interventions.  Educated family on infectious process.    Wound Therapy - Functional Problem List decreased mobility; increased pain, decreased skin itegrity  Factors Delaying/Impairing Wound Healing Altered sensation;Infection - systemic/local;Immobility;Vascular compromise  Hydrotherapy Plan Debridement;Dressing change;Patient/family education;Pulsatile lavage with suction  Wound Therapy - Frequency 6X / week  Wound Therapy - Follow Up Recommendations Home health RN  Wound Plan continue to treat as indicated  Wound Therapy Goals - Improve the function of patient's integumentary system by progressing the wound(s) through the phases of wound healing by:  Decrease Necrotic Tissue to <25%  Decrease Necrotic Tissue - Progress Progressing toward goal  Increase Granulation Tissue to >75%  Increase  Granulation Tissue - Progress Progressing toward goal  Improve Drainage Characteristics Min  Improve Drainage Characteristics - Progress Progressing toward goal  Patient/Family will be able to  demontrate good physiological management contributions (eating well, participating in therapy; understanding of hygiene techniques)  Goals/treatment  plan/discharge plan were made with and agreed upon by patient/family Yes  Time For Goal Achievement 7 days  Wound Therapy - Potential for [place], PT DPT  [phone]

## 2012-07-19 LAB — CBC
Hemoglobin: 7.8 g/dL — ABNORMAL LOW (ref 12.0–15.0)
MCH: 26.6 pg (ref 26.0–34.0)
MCV: 78.8 fL (ref 78.0–100.0)
Platelets: 368 10*3/uL (ref 150–400)
RBC: 2.93 MIL/uL — ABNORMAL LOW (ref 3.87–5.11)
WBC: 7.8 10*3/uL (ref 4.0–10.5)

## 2012-07-19 LAB — GLUCOSE, CAPILLARY
Glucose-Capillary: 58 mg/dL — ABNORMAL LOW (ref 70–99)
Glucose-Capillary: 90 mg/dL (ref 70–99)
Glucose-Capillary: 46 mg/dL — ABNORMAL LOW (ref 70–99)
Glucose-Capillary: 86 mg/dL (ref 70–99)

## 2012-07-19 LAB — BASIC METABOLIC PANEL
CO2: 25 mEq/L (ref 19–32)
Calcium: 8.3 mg/dL — ABNORMAL LOW (ref 8.4–10.5)
Chloride: 107 mEq/L (ref 96–112)
Glucose, Bld: 64 mg/dL — ABNORMAL LOW (ref 70–99)
Sodium: 138 mEq/L (ref 135–145)

## 2012-07-19 MED ORDER — FUROSEMIDE 10 MG/ML IJ SOLN
40.0000 mg | Freq: Every day | INTRAMUSCULAR | Status: DC
  Administered 2012-07-19 – 2012-07-23 (×5): 40 mg via INTRAVENOUS
  Filled 2012-07-19 (×4): qty 4

## 2012-07-19 MED ORDER — DIPHENHYDRAMINE HCL 25 MG PO CAPS
25.0000 mg | ORAL_CAPSULE | ORAL | Status: DC | PRN
  Administered 2012-07-19 – 2012-07-26 (×5): 25 mg via ORAL
  Filled 2012-07-19 (×5): qty 1

## 2012-07-19 MED ORDER — ENSURE COMPLETE PO LIQD
237.0000 mL | ORAL | Status: DC
  Administered 2012-07-20 – 2012-07-26 (×8): 237 mL via ORAL

## 2012-07-19 MED ORDER — PRO-STAT SUGAR FREE PO LIQD
30.0000 mL | Freq: Two times a day (BID) | ORAL | Status: DC
  Administered 2012-07-19 – 2012-07-26 (×13): 30 mL via ORAL
  Filled 2012-07-19 (×16): qty 30

## 2012-07-19 MED ORDER — INSULIN GLARGINE 100 UNIT/ML ~~LOC~~ SOLN
15.0000 [IU] | Freq: Every day | SUBCUTANEOUS | Status: DC
  Administered 2012-07-19 – 2012-07-23 (×5): 15 [IU] via SUBCUTANEOUS
  Administered 2012-07-24: 22:00:00 via SUBCUTANEOUS
  Administered 2012-07-25: 15 [IU] via SUBCUTANEOUS

## 2012-07-19 MED ORDER — DIPHENHYDRAMINE HCL 50 MG/ML IJ SOLN
25.0000 mg | Freq: Once | INTRAMUSCULAR | Status: DC
  Filled 2012-07-19: qty 1

## 2012-07-19 MED ORDER — HYDROMORPHONE HCL PF 1 MG/ML IJ SOLN
1.0000 mg | INTRAMUSCULAR | Status: DC | PRN
  Administered 2012-07-19: 1 mg via INTRAVENOUS
  Administered 2012-07-19 – 2012-07-26 (×29): 2 mg via INTRAVENOUS
  Filled 2012-07-19 (×31): qty 2

## 2012-07-19 NOTE — Progress Notes (Addendum)
NUTRITION FOLLOW UP  DOCUMENTATION CODES  Per approved criteria   -Morbid Obesity    Intervention:   1. Decrease Prostat to BID 2. Add Ensure Complete po daily, each supplement provides 350 kcal and 13 grams of protein. 3. RD to continue to follow nutrition care plan  Nutrition Dx:   Increased nutrient needs related to open thigh abscess as evidenced by estimated nutrition needs.   Goal:   Intake to meet >90% of estimated nutrition needs to promote wound healing.  Monitor:   PO intake, wound healing, labs, weight trend.  Assessment:   Patient has had multiple debridements of left thigh wound/abscess in the OR. Receiving hydrotherapy to open thigh wound/abscess.   Pt reports that she is eating well, however she feels as though she is not getting enough to eat. Would benefit from additional high-calorie supplement to help with increased kcal and protein needs. Pt is agreeable to plan.  Height: Ht Readings from Last 1 Encounters:  07/18/12 5\' 10"  (1.778 m)    Weight Status:   Wt Readings from Last 1 Encounters:  07/18/12 348 lb 12.3 oz (158.2 kg)    Re-estimated needs:  Kcal: 2350 Protein: 145 grams Fluid: 2.4 liters  Skin: large open abscess on left thigh with multiple areas of tunneling  Diet Order: Carb Control Medium, 1600 - 2000   Intake/Output Summary (Last 24 hours) at 07/19/12 1532 Last data filed at 07/19/12 [id number]  Gross per 24 hour  Intake 3823.4 ml  Output   6850 ml  Net -3026.6 ml    Last BM: 2/27   Labs:   Recent Labs Lab 07/17/12 0420 07/18/12 0433 07/19/12 0500  NA 140 137 138  K 3.9 3.7 3.8  CL 108 107 107  CO2 21 23 25   BUN 17 17 19   CREATININE 1.70* 1.73* 1.71*  CALCIUM 8.3* 8.2* 8.3*  GLUCOSE 79 66* 64*    CBG (last 3)   Recent Labs  07/19/12 0740 07/19/12 0802 07/19/12 1213  GLUCAP 58* 70 90    Scheduled Meds: . amitriptyline  50 mg Oral QHS  . bd getting started take home kit  1 kit Other Once  . clindamycin  (CLEOCIN) IV  600 mg Intravenous Q8H  . collagenase   Topical Daily  . diphenhydrAMINE  25 mg Intravenous Once  . feeding supplement  30 mL Oral TID WC  . FLUoxetine  40 mg Oral Daily  . [START ON 07/20/2012] furosemide  40 mg Intravenous Daily  . heparin subcutaneous  5,000 Units Subcutaneous Q8H  . HYDROcodone-acetaminophen  1-2 tablet Oral Q4H  . insulin aspart  0-20 Units Subcutaneous TID WC  . insulin aspart  0-5 Units Subcutaneous QHS  . insulin aspart  6 Units Subcutaneous TID WC  . insulin glargine  30 Units Subcutaneous QHS  . multivitamin with minerals  1 tablet Oral Daily  . sodium chloride  10-40 mL Intracatheter Q12H    Continuous Infusions:  none  Inda Coke MS, RD, LDN Pager: [phone] After-hours pager: [phone]

## 2012-07-19 NOTE — Progress Notes (Signed)
Pt incontinent of large amount of stool- soiled dressing. Dressing changed, will continue to monitor.

## 2012-07-19 NOTE — Progress Notes (Signed)
[name] [name] 3 Days Post-Op  Subjective: Patient still having a lot of pain in her left leg and buttocks, denies fevers or chills, eating well  Objective: Vital signs in last 24 hours: Temp:  [98.3 F (36.8 C)-99.1 F (37.3 C)] 98.3 F (36.8 C) (02/27 0641) Pulse Rate:  [100-104] 100 (02/27 0641) Resp:  [18] 18 (02/27 0641) BP: (134-148)/(75-86) 142/75 mmHg (02/27 0641) SpO2:  [96 %-100 %] 100 % (02/27 0641) Weight:  [348 lb 12.3 oz (158.2 kg)] 348 lb 12.3 oz (158.2 kg) (02/26 2112) Last BM Date: 07/19/12  Intake/Output from previous day: 02/26 0701 - 02/27 0700 In: 5063.4 [P.O.:2360; I.V.:1965; IV Piggyback:738.4] Out: 9451 [Urine:9450; Stool:1] Intake/Output this shift:    General: No acute distress Extremities: wound appears stable, some necrotic tissue still present, granulation tissue seen Neuro: Intact  Lab Results:  @LABLAST2 (wbc:2,hgb:2,hct:2,plt:2) BMET  Recent Labs  07/18/12 0433 07/19/12 0500  NA 137 138  K 3.7 3.8  CL 107 107  CO2 23 25  GLUCOSE 66* 64*  BUN 17 19  CREATININE 1.73* 1.71*  CALCIUM 8.2* 8.3*   PT/INR No results found for this basename: LABPROT, INR,  in the last 72 hours ABG No results found for this basename: PHART, PCO2, PO2, HCO3,  in the last 72 hours  Studies/Results: No results found.  Anti-infectives: Anti-infectives   Start     Dose/Rate Route Frequency Ordered Stop   07/15/12 0830  DAPTOmycin (CUBICIN) 640 mg in sodium chloride 0.9 % IVPB  Status:  Discontinued     640 mg 225.6 mL/hr over 30 Minutes Intravenous Every 24 hours 07/15/12 0832 07/19/12 0814   07/09/12 1800  vancomycin (VANCOCIN) 2,000 mg in sodium chloride 0.9 % 500 mL IVPB  Status:  Discontinued     2,000 mg 250 mL/hr over 120 Minutes Intravenous Every 24 hours 07/08/12 2308 07/10/12 1132   07/07/12 1000  vancomycin (VANCOCIN) 1,250 mg in sodium chloride 0.9 % 250 mL IVPB  Status:  Discontinued     1,250 mg 166.7 mL/hr over 90 Minutes  Intravenous Every 12 hours 07/06/12 2110 07/08/12 2251   07/07/12 0030  clindamycin (CLEOCIN) IVPB 600 mg     600 mg 100 mL/hr over 30 Minutes Intravenous 3 times per day 07/07/12 0019     07/06/12 2200  linezolid (ZYVOX) IVPB 600 mg  Status:  Discontinued     600 mg 300 mL/hr over 60 Minutes Intravenous Every 12 hours 07/06/12 1612 07/06/12 2050   07/06/12 2200  piperacillin-tazobactam (ZOSYN) IVPB 3.375 g  Status:  Discontinued     3.375 g 12.5 mL/hr over 240 Minutes Intravenous 3 times per day 07/06/12 1646 07/11/12 0751   07/06/12 2115  vancomycin (VANCOCIN) 2,000 mg in sodium chloride 0.9 % 500 mL IVPB     2,000 mg 250 mL/hr over 120 Minutes Intravenous  Once 07/06/12 2110 07/07/12 0042   07/06/12 2100  vancomycin (VANCOCIN) IVPB 1000 mg/200 mL premix  Status:  Discontinued     1,000 mg 200 mL/hr over 60 Minutes Intravenous  Once 07/06/12 2050 07/06/12 2109   07/06/12 1830  piperacillin-tazobactam (ZOSYN) IVPB 3.375 g  Status:  Discontinued     3.375 g 100 mL/hr over 30 Minutes Intravenous 4 times per day 07/06/12 1825 07/06/12 1858   07/06/12 1830  vancomycin (VANCOCIN) IVPB 1000 mg/200 mL premix  Status:  Discontinued     1,000 mg 200 mL/hr over 60 Minutes Intravenous Every 12 hours 07/06/12 1825 07/06/12 1920   07/06/12 1615  linezolid (ZYVOX) IVPB 600 mg  Status:  Discontinued     600 mg 300 mL/hr over 60 Minutes Intravenous Every 12 hours 07/06/12 1610 07/06/12 1619   07/06/12 1300  piperacillin-tazobactam (ZOSYN) IVPB 3.375 g     3.375 g 100 mL/hr over 30 Minutes Intravenous  Once 07/06/12 1250 07/06/12 1450   07/06/12 1300  clindamycin (CLEOCIN) IVPB 600 mg     600 mg 100 mL/hr over 30 Minutes Intravenous  Once 07/06/12 1253 07/06/12 1352   07/06/12 1130  vancomycin (VANCOCIN) IVPB 1000 mg/200 mL premix     1,000 mg 200 mL/hr over 60 Minutes Intravenous  Once 07/06/12 1117 07/06/12 1230      Assessment/Plan: 1. Left thigh abscess s/p I and D (multiple): wound stable,  seems to be responding to hydrotherapy probably not small enough to apply wound vac yet. Will continue to monitor    LOS: 13 days   Richerd Grime, [place]  [phone] Surgery 07/19/2012

## 2012-07-19 NOTE — Progress Notes (Signed)
Physical Therapy Wound Treatment Patient Details  Name: [name] [name] MRN: [id number] Date of Birth: 15-Mar-1976  Today's Date: 07/19/2012 Time: [id number] Time Calculation (min): 80 min  Subjective  Subjective: Pt with decreased pain  Patient and Family Stated Goals: heal the wound Date of Onset:  (prior to admission) Prior Treatments: Surgical I&D (multiple)  Pain Score: Pain Score:   7     07/19/12 1300  Subjective Assessment  Subjective Pt with decreased pain   Patient and Family Stated Goals heal the wound  Date of Onset (prior to admission)  Prior Treatments Surgical I&D (multiple)  Evaluation and Treatment  Evaluation and Treatment Procedures Explained to Patient/Family Yes  Evaluation and Treatment Procedures agreed to  Wound 07/17/12 Perineum Medial Open abscess perineum and thigh   Date First Assessed/Time First Assessed: 07/17/12 1200   Location: Perineum  Location Orientation: Medial  Wound Description (Comments): Open abscess perineum and thigh   Present on Admission: Yes  Site / Wound Assessment Yellow;Granulation tissue;Pink;Black (Wound connection brideged with thigh)  % Wound base Red or Granulating 60%  % Wound base Yellow 20%  % Wound base Black 10%  % Wound base Other (Comment) 10% (subcutaneous fatty tissue; exposed muscle tissue)  Peri-wound Assessment Erythema (blanchable);Maceration;Pink  Margins Unattacted edges (unapproximated)  Closure None  Drainage Amount Moderate  Drainage Description Serosanguineous  Treatment Debridement (Selective);Hydrotherapy (Pulse lavage);Packing (Saline gauze);Tape changed  Dressing Type ABD;Barrier Film (skin prep);Gauze (Comment);Moist to dry (santyl)  Dressing Changed Changed  Dressing Status Clean;Intact  Wound 07/17/12 Abdomen Left Necrotizing Faciitis  Date First Assessed: 07/17/12   Location: Abdomen  Location Orientation: Left  Wound Description (Comments): Necrotizing Faciitis  Site / Wound Assessment  Brown;Dusky;Granulation tissue;Painful;Pink;Red;Yellow  % Wound base Red or Granulating 65%  % Wound base Yellow 35%  Peri-wound Assessment Erythema (blanchable)  Margins Unattacted edges (unapproximated)  Drainage Amount Minimal  Drainage Description Serosanguineous;Odor  Treatment Debridement (Selective);Hydrotherapy (Pulse lavage);Packing (Saline gauze)  Dressing Type Gauze (Comment);Moist to dry  Dressing Changed Changed  Dressing Status Clean;Intact  Hydrotherapy  Pulsed Lavage with Suction (psi) 4 psi  Pulsed Lavage with Suction - Normal Saline Used 2000 mL  Pulsed Lavage Tip Tip with splash shield  Pulsed lavage therapy - wound location perinieum; and thigh wounds  Selective Debridement  Selective Debridement - Location perineum and thigh wound  Selective Debridement - Tools Used Forceps;Scissors  Selective Debridement - Tissue Removed necrotic tissue and unviable slough  Wound Therapy - Assess/Plan/Recommendations  Wound Therapy - Clinical Statement Wounds with significant depth and demensions. Necortic slough continues to appear looser today. Able to debride some portions; chemical agent applied. Provided PLS treatment and performed slective sharp debridement at bedside. Dressing was changed and santyl ointment applied for chemical debridement.   Wound Therapy - Functional Problem List decreased mobility; increased pain, decreased skin itegrity  Factors Delaying/Impairing Wound Healing Altered sensation;Infection - systemic/local;Immobility;Vascular compromise  Hydrotherapy Plan Debridement;Dressing change;Patient/family education;Pulsatile lavage with suction  Wound Therapy - Frequency 6X / week  Wound Therapy - Follow Up Recommendations Home health RN  Wound Plan continue to treat as indicated  Wound Therapy Goals - Improve the function of patient's integumentary system by progressing the wound(s) through the phases of wound healing by:  Decrease Necrotic Tissue to <25%   Decrease Necrotic Tissue - Progress Progressing toward goal  Increase Granulation Tissue to >75%  Increase Granulation Tissue - Progress Progressing toward goal  Improve Drainage Characteristics Min  Improve Drainage Characteristics - Progress Progressing toward goal  Patient/Family  will be able to  demontrate good physiological management contributions (eating well, participating in therapy; understanding of hygiene techniques)  Patient/Family Instruction Goal - Progress Progressing toward goal  Goals/treatment plan/discharge plan were made with and agreed upon by patient/family Yes  Time For Goal Achievement 7 days  Wound Therapy - Potential for [place], PT DPT  [phone]

## 2012-07-19 NOTE — Progress Notes (Signed)
Visit to patient while in hospital. Will call after she is discharged to assist with transition of care. 

## 2012-07-19 NOTE — Progress Notes (Signed)
Visitors in to see pt. informed that pt. is on isolation and of need to wear protective gown. Visitor refused to wear isolation gown and refused to place on small child. Charge RN- Clinical research associate, informed.

## 2012-07-19 NOTE — Progress Notes (Signed)
Subjective: Pt doing well this AM. VSS not febrile. Pt overwhelmed with what is happening in terms of future of rehab and plastic surgery. Otherwise still having some pain and itchiness.   Objective: Vital signs in last 24 hours: Filed Vitals:   07/18/12 1238 07/18/12 1816 07/18/12 2112 07/19/12 0641  BP:  140/85 134/86 142/75  Pulse:  104 100 100  Temp: 99.1 F (37.3 C) 98.6 F (37 C) 98.4 F (36.9 C) 98.3 F (36.8 C)  TempSrc: Oral Oral Oral Oral  Resp:  18 18 18   Height:   5\' 10"  (1.778 m)   Weight:   348 lb 12.3 oz (158.2 kg)   SpO2:  99% 96% 100%   Weight change:   Intake/Output Summary (Last 24 hours) at 07/19/12 1135 Last data filed at 07/19/12 [id number]  Gross per 24 hour  Intake 4343.4 ml  Output   8201 ml  Net -3857.6 ml   General: resting in bed,NAD, anxious  HEENT: PERRL, EOMI, no scleral icterus  Cardiac: RRR, no rubs, murmurs or gallops  Pulm: clear to auscultation bilaterally, moving normal volumes of air  Abd: soft, nontender, nondistended, BS present  Ext: warm and well perfused, no pedal edema, scattered pustules over face and UE all different stages of healing, left groin dressing from medial thigh to left labial draining serosanginous fluid, ttp, no crepitus, but increasing induration around bandage borders, foley with sediment but no gross hematuria  Neuro: alert and oriented X3, cranial nerves II-XII grossly intact, depressed affect  Lab Results: Basic Metabolic Panel:  Recent Labs Lab 07/18/12 0433 07/19/12 0500  NA 137 138  K 3.7 3.8  CL 107 107  CO2 23 25  GLUCOSE 66* 64*  BUN 17 19  CREATININE 1.73* 1.71*  CALCIUM 8.2* 8.3*   Liver Function Tests:  Recent Labs Lab 07/15/12 1700  AST 12  ALT 7  ALKPHOS 109  BILITOT 0.2*  PROT 6.5  ALBUMIN 1.3*   CBC:  Recent Labs Lab 07/18/12 0433 07/19/12 0500  WBC 8.8 7.8  HGB 7.9* 7.8*  HCT 23.0* 23.1*  MCV 79.0 78.8  PLT 321 368   Cardiac Enzymes:  Recent Labs Lab 07/15/12 0955   CKTOTAL 168   CBG:  Recent Labs Lab 07/18/12 1641 07/18/12 1710 07/18/12 1747 07/18/12 2118 07/19/12 0740 07/19/12 0802  GLUCAP 64* 63* 81 95 58* 70   Micro Results: No results found for this or any previous visit (from the past 240 hour(s)). Studies/Results: No results found. Medications: I have reviewed the patient's current medications. Scheduled Meds: . amitriptyline  50 mg Oral QHS  . bd getting started take home kit  1 kit Other Once  . clindamycin (CLEOCIN) IV  600 mg Intravenous Q8H  . collagenase   Topical Daily  . diphenhydrAMINE  25 mg Intravenous Once  . feeding supplement  30 mL Oral TID WC  . FLUoxetine  40 mg Oral Daily  . [START ON 07/20/2012] furosemide  40 mg Intravenous Daily  . heparin subcutaneous  5,000 Units Subcutaneous Q8H  . HYDROcodone-acetaminophen  1-2 tablet Oral Q4H  . insulin aspart  0-20 Units Subcutaneous TID WC  . insulin aspart  0-5 Units Subcutaneous QHS  . insulin aspart  6 Units Subcutaneous TID WC  . insulin glargine  30 Units Subcutaneous QHS  . multivitamin with minerals  1 tablet Oral Daily  . sodium chloride  10-40 mL Intracatheter Q12H   Continuous Infusions:   PRN Meds:.diphenhydrAMINE, HYDROmorphone (DILAUDID) injection, ondansetron (ZOFRAN)  IV, sodium chloride Assessment/Plan: 1. Necrotizing fascitis: Pt was admitted on 2/14 and taken emergently to OR for surgical debridement of site suspicious for nec fasc. Over L groin. Pt was then immediately started on Vanc/zosyn/clinda. Repeat debridement of necrotic tissue on 2/15, 2/16, 2/17. Minimal blood loss. Wound and tissue cultures pending. Tissue culture MRSA R PCN/erythromycin and D-test negative.  -continue Clindamycin  -d/c daptomycin as pt improving -surgery following considering wound vac once size decreases  2. ATN resolving : Pt Cr 1.7 (downtrending and responding to fluids) today from presumed baseline of 0.7 on admission. Previous UA shows waxy/granular casts and  uric acid crystals along with hematuria (RBCs 21-50). Some of hematuria maybe 2/2 traumatic foley insertion.  -continue lasix 40mg  once daily -continue foley with irrigations  -repeat Bmet  -d/c fluids as pt is tolerating regular diet and drinking plenty of fluids  3. Acute Anemia: Pt baseline Hgb 10 and today 6.8 yesterday pt received 2 PRBCs post-transfusion pending . Review of intraoperative notes doesn't appear to be etiology of bleeding. No active sites of bleeding.  -monitor CBC  -transfuse <7.0   4. Poorly controlled DM type 2: Hgb A1c 15.4 and outpt meds include metformin and glipizide. Pt initially presented in DKA on 2/14 with AG of 24 that resolved after fluids. No ketones in urine.  -cont SSI  -carb modified  -will need to manage total insulin requirement  -DM educator   Dispo: Disposition is deferred at this time, awaiting improvement of current medical problems.  Anticipated discharge in approximately 5-7 day(s).   The patient does have a current PCP (PRIBULA,CHRISTOPHER, MD), therefore will be requiring OPC follow-up after discharge.   The patient does not have transportation limitations that hinder transportation to clinic appointments.  .Services Needed at time of discharge: Y = Yes, Blank = No PT:   OT:   RN:   Equipment:   Other:     LOS: 13 days   Clinton Gallant 07/19/2012, 11:35 AM [id number]

## 2012-07-19 NOTE — Progress Notes (Signed)
Pt complaining of severe itching, no PRNs ordered. MD notified at [phone], will continue to monitor.

## 2012-07-19 NOTE — Plan of Care (Signed)
Problem: Phase I Progression Outcomes Goal: Voiding-avoid urinary catheter unless indicated Outcome: Not Met (add Reason) Perineal wound requires foley to keep wound bed clean

## 2012-07-19 NOTE — Progress Notes (Signed)
Inpatient Diabetes Program Recommendations  AACE/ADA: New Consensus Statement on Inpatient Glycemic Control (2013)  Target Ranges:  Prepandial:   less than 140 mg/dL      Peak postprandial:   less than 180 mg/dL (1-2 hours)      Critically ill patients:  140 - 180 mg/dL    Results for DERRICKA, FROESE (MRN [id number]) as of 07/19/2012 10:26  Ref. Range 07/18/2012 08:36 07/18/2012 12:18 07/18/2012 14:23 07/18/2012 16:41 07/18/2012 17:10 07/18/2012 17:47 07/18/2012 21:18  Glucose-Capillary Latest Range: 70-99 mg/dL 75 107 (H) 130 (H) 64 (L) 63 (L) 81 95   Results for HAADIYA, NARAIN (MRN [id number]) as of 07/19/2012 10:26  Ref. Range 07/19/2012 07:40 07/19/2012 08:02  Glucose-Capillary Latest Range: 70-99 mg/dL 58 (L) 70   Hypoglycemic yesterday at supper after receiving 6 units Novolog meal coverage at lunch.  Hypoglycemic this morning after receiving 30 units Lantus last night.  Recommend the following insulin adjustments: 1. Decrease Lantus to 28 units QHS 2. Decrease Novolog meal coverage to 5 units tid with meals (currently ordered as 6 units tid with meals)   Will follow. Wyn Quaker RN, MSN, CDE Diabetes Coordinator Inpatient Diabetes Program [phone]

## 2012-07-20 LAB — CBC
Hemoglobin: 7.6 g/dL — ABNORMAL LOW (ref 12.0–15.0)
Platelets: 341 10*3/uL (ref 150–400)
RBC: 2.88 MIL/uL — ABNORMAL LOW (ref 3.87–5.11)
WBC: 5.9 10*3/uL (ref 4.0–10.5)

## 2012-07-20 LAB — BASIC METABOLIC PANEL
CO2: 26 mEq/L (ref 19–32)
Glucose, Bld: 95 mg/dL (ref 70–99)
Potassium: 4.1 mEq/L (ref 3.5–5.1)
Sodium: 139 mEq/L (ref 135–145)

## 2012-07-20 LAB — GLUCOSE, CAPILLARY
Glucose-Capillary: 79 mg/dL (ref 70–99)
Glucose-Capillary: 97 mg/dL (ref 70–99)
Glucose-Capillary: 163 mg/dL — ABNORMAL HIGH (ref 70–99)

## 2012-07-20 NOTE — Progress Notes (Signed)
Subjective: Pt doing well this AM. VSS not febrile. Pt overwhelmed with what is happening in terms of future of rehab and plastic surgery. Otherwise still having some pain and itchiness.   Objective: Vital signs in last 24 hours: Filed Vitals:   07/19/12 1400 07/19/12 1700 07/19/12 2112 07/20/12 0525  BP: 141/80 135/79 130/73 161/102  Pulse: 95 97 100 85  Temp: 98.1 F (36.7 C) 98.2 F (36.8 C) 98.2 F (36.8 C) 98.1 F (36.7 C)  TempSrc: Oral Oral Oral Oral  Resp: 18 18 20 18   Height:      Weight:   348 lb 12.3 oz (158.2 kg)   SpO2: 98% 97% 100% 100%   Weight change: -0 oz (-0.001 kg)  Intake/Output Summary (Last 24 hours) at 07/20/12 0739 Last data filed at 07/20/12 0530  Gross per 24 hour  Intake   1140 ml  Output   1750 ml  Net   -610 ml   General: resting in bed,NAD, anxious  HEENT: PERRL, EOMI, no scleral icterus  Cardiac: RRR, no rubs, murmurs or gallops  Pulm: clear to auscultation bilaterally, moving normal volumes of air  Abd: soft, nontender, nondistended, BS present  Ext: warm and well perfused, no pedal edema, scattered pustules over face and UE all different stages of healing, left groin dressing from medial thigh to left labial draining serosanginous fluid, ttp, no crepitus, but increasing induration around bandage borders, foley with sediment but no gross hematuria  Neuro: alert and oriented X3, cranial nerves II-XII grossly intact, depressed affect  Lab Results: Basic Metabolic Panel:  Recent Labs Lab 07/19/12 0500 07/20/12 0605  NA 138 139  K 3.8 4.1  CL 107 107  CO2 25 26  GLUCOSE 64* 95  BUN 19 21  CREATININE 1.71* 1.46*  CALCIUM 8.3* 8.3*   Liver Function Tests:  Recent Labs Lab 07/15/12 1700  AST 12  ALT 7  ALKPHOS 109  BILITOT 0.2*  PROT 6.5  ALBUMIN 1.3*   CBC:  Recent Labs Lab 07/19/12 0500 07/20/12 0605  WBC 7.8 5.9  HGB 7.8* 7.6*  HCT 23.1* 23.0*  MCV 78.8 79.9  PLT 368 341   Cardiac Enzymes:  Recent Labs Lab  07/15/12 0955  CKTOTAL 168   CBG:  Recent Labs Lab 07/19/12 0802 07/19/12 1213 07/19/12 1659 07/19/12 1729 07/19/12 1801 07/19/12 2115  GLUCAP 70 90 46* 57* 86 123*   Micro Results: No results found for this or any previous visit (from the past 240 hour(s)). Studies/Results: No results found. Medications: I have reviewed the patient's current medications. Scheduled Meds: . amitriptyline  50 mg Oral QHS  . bd getting started take home kit  1 kit Other Once  . clindamycin (CLEOCIN) IV  600 mg Intravenous Q8H  . collagenase   Topical Daily  . diphenhydrAMINE  25 mg Intravenous Once  . feeding supplement  237 mL Oral Q24H  . feeding supplement  30 mL Oral BID  . FLUoxetine  40 mg Oral Daily  . furosemide  40 mg Intravenous Daily  . heparin subcutaneous  5,000 Units Subcutaneous Q8H  . HYDROcodone-acetaminophen  1-2 tablet Oral Q4H  . insulin aspart  0-20 Units Subcutaneous TID WC  . insulin aspart  0-5 Units Subcutaneous QHS  . insulin aspart  6 Units Subcutaneous TID WC  . insulin glargine  15 Units Subcutaneous QHS  . multivitamin with minerals  1 tablet Oral Daily  . sodium chloride  10-40 mL Intracatheter Q12H   Continuous Infusions:  PRN Meds:.diphenhydrAMINE, HYDROmorphone (DILAUDID) injection, ondansetron (ZOFRAN) IV, sodium chloride Assessment/Plan: 1. Necrotizing fascitis: Pt was admitted on 2/14 and taken emergently to OR for surgical debridement of site suspicious for nec fasc. Over L groin. Pt was then immediately started on Vanc/zosyn/clinda. Repeat debridement of necrotic tissue on 2/15, 2/16, 2/17. Minimal blood loss. Wound and tissue cultures pending. Tissue culture MRSA R PCN/erythromycin and D-test negative.  -continue Clindamycin  -surgery following considering wound vac once size decreases  2. ATN resolving : Pt Cr 1.4 (downtrending and responding to fluids) today from presumed baseline of 0.7 on admission. Previous UA shows waxy/granular casts and  uric acid crystals along with hematuria (RBCs 21-50). Some of hematuria maybe 2/2 traumatic foley insertion.  -continue lasix 40mg  once daily -continue foley with irrigations  -repeat Bmet  -d/c fluids as pt is tolerating regular diet and drinking plenty of fluids  3. Acute Anemia: Pt baseline Hgb 10 and today 7.9. Review of intraoperative notes doesn't appear to be etiology of bleeding. No active sites of bleeding.  -monitor CBC  -transfuse <7.0   4. Poorly controlled DM type 2: Hgb A1c 15.4 and outpt meds include metformin and glipizide. Pt initially presented in DKA on 2/14 with AG of 24 that resolved after fluids. No ketones in urine. Pt having episodes of hypoglycemia into the 60s and decreased Lantus from 30U to 15U.  -cont SSI  -carb modified  -will need to manage total insulin requirement by d/c and will need Novolog 70/30 equilviant as pt doesn't  -DM educator   Dispo: Disposition is deferred at this time, awaiting improvement of current medical problems.  Anticipated discharge in approximately 5-7 day(s).   The patient does have a current PCP (PRIBULA,CHRISTOPHER, MD), therefore will be requiring OPC follow-up after discharge.   The patient does not have transportation limitations that hinder transportation to clinic appointments.  .Services Needed at time of discharge: Y = Yes, Blank = No PT:   OT:   RN:   Equipment:   Other:     LOS: 14 days   Clinton Gallant 07/20/2012, 7:39 AM [id number]

## 2012-07-20 NOTE — Progress Notes (Signed)
Pt CBGs remained low today with episode of hypoglycemia. Spoke with MD, changed Lantus for 30 units qhs to 15 units qhs, will continue to monitor.

## 2012-07-20 NOTE — Progress Notes (Signed)
Agree with PA-C note.  [name] [name]. Dahlia Bailiff, MD, [place] [phone] [facility] Surgery

## 2012-07-20 NOTE — Progress Notes (Signed)
I met with pt. and welcomed her to the unit. Pt. stated that she had undergone multiple surgeries within a short period of time and is going through a lot of physical pain. Pt. Stated that she is trying to be hopeful about her situation and explained where she draws her strength from. Pt. Stated that she is trying to be supportive to her family despite her difficult situation. Pt. appreciated my visit, and pt. and I plan for another visit next week.   [name] [name] Counselor Intern [facility]

## 2012-07-20 NOTE — Progress Notes (Signed)
4 Days Post-Op  Subjective: Pt still c/o pain in left leg and buttock, but tolerating hydrotherapy much better.  Still requires 2 dilaudid pre hydro.  Objective: Vital signs in last 24 hours: Temp:  [98.1 F (36.7 C)-98.2 F (36.8 C)] 98.2 F (36.8 C) (02/28 0829) Pulse Rate:  [85-106] 106 (02/28 0829) Resp:  [18-20] 20 (02/28 0829) BP: (130-161)/(73-102) 158/94 mmHg (02/28 0829) SpO2:  [97 %-100 %] 100 % (02/28 0829) Weight:  [348 lb 12.3 oz (158.2 kg)] 348 lb 12.3 oz (158.2 kg) (02/27 2112) Last BM Date: 07/19/12  Intake/Output from previous day: 02/27 0701 - 02/28 0700 In: 1140 [P.O.:720; IV Piggyback:50] Out: [id number] [Urine:1750] Intake/Output this shift: Total I/O In: 360 [P.O.:360] Out: 600 [Urine:600]  PE: Gen:  Alert, NAD, pleasant Groin, buttock, left thigh wounds:  Appears stable, some necrotic tissue still present, granulation tissue seen, no areas of erythema, induration or fluctuance.  No sites that need OR debridement.   Lab Results:   Recent Labs  07/19/12 0500 07/20/12 0605  WBC 7.8 5.9  HGB 7.8* 7.6*  HCT 23.1* 23.0*  PLT 368 341   BMET  Recent Labs  07/19/12 0500 07/20/12 0605  NA 138 139  K 3.8 4.1  CL 107 107  CO2 25 26  GLUCOSE 64* 95  BUN 19 21  CREATININE 1.71* 1.46*  CALCIUM 8.3* 8.3*   PT/INR No results found for this basename: LABPROT, INR,  in the last 72 hours CMP     Component Value Date/Time   NA 139 07/20/2012 0605   K 4.1 07/20/2012 0605   CL 107 07/20/2012 0605   CO2 26 07/20/2012 0605   GLUCOSE 95 07/20/2012 0605   BUN 21 07/20/2012 0605   CREATININE 1.46* 07/20/2012 0605   CALCIUM 8.3* 07/20/2012 0605   PROT 6.5 07/15/2012 1700   ALBUMIN 1.3* 07/15/2012 1700   AST 12 07/15/2012 1700   ALT 7 07/15/2012 1700   ALKPHOS 109 07/15/2012 1700   BILITOT 0.2* 07/15/2012 1700   GFRNONAA 45* 07/20/2012 0605   GFRAA 52* 07/20/2012 0605   Lipase  No results found for this basename: lipase       Studies/Results: No results  found.  Anti-infectives: Anti-infectives   Start     Dose/Rate Route Frequency Ordered Stop   07/15/12 0830  DAPTOmycin (CUBICIN) 640 mg in sodium chloride 0.9 % IVPB  Status:  Discontinued     640 mg 225.6 mL/hr over 30 Minutes Intravenous Every 24 hours 07/15/12 0832 07/19/12 0814   07/09/12 1800  vancomycin (VANCOCIN) 2,000 mg in sodium chloride 0.9 % 500 mL IVPB  Status:  Discontinued     2,000 mg 250 mL/hr over 120 Minutes Intravenous Every 24 hours 07/08/12 2308 07/10/12 1132   07/07/12 1000  vancomycin (VANCOCIN) 1,250 mg in sodium chloride 0.9 % 250 mL IVPB  Status:  Discontinued     1,250 mg 166.7 mL/hr over 90 Minutes Intravenous Every 12 hours 07/06/12 2110 07/08/12 2251   07/07/12 0030  clindamycin (CLEOCIN) IVPB 600 mg     600 mg 100 mL/hr over 30 Minutes Intravenous 3 times per day 07/07/12 0019     07/06/12 2200  linezolid (ZYVOX) IVPB 600 mg  Status:  Discontinued     600 mg 300 mL/hr over 60 Minutes Intravenous Every 12 hours 07/06/12 1612 07/06/12 2050   07/06/12 2200  piperacillin-tazobactam (ZOSYN) IVPB 3.375 g  Status:  Discontinued     3.375 g 12.5 mL/hr over 240 Minutes Intravenous  3 times per day 07/06/12 1646 07/11/12 0751   07/06/12 2115  vancomycin (VANCOCIN) 2,000 mg in sodium chloride 0.9 % 500 mL IVPB     2,000 mg 250 mL/hr over 120 Minutes Intravenous  Once 07/06/12 2110 07/07/12 0042   07/06/12 2100  vancomycin (VANCOCIN) IVPB 1000 mg/200 mL premix  Status:  Discontinued     1,000 mg 200 mL/hr over 60 Minutes Intravenous  Once 07/06/12 2050 07/06/12 2109   07/06/12 1830  piperacillin-tazobactam (ZOSYN) IVPB 3.375 g  Status:  Discontinued     3.375 g 100 mL/hr over 30 Minutes Intravenous 4 times per day 07/06/12 1825 07/06/12 1858   07/06/12 1830  vancomycin (VANCOCIN) IVPB 1000 mg/200 mL premix  Status:  Discontinued     1,000 mg 200 mL/hr over 60 Minutes Intravenous Every 12 hours 07/06/12 1825 07/06/12 1920   07/06/12 1615  linezolid (ZYVOX) IVPB  600 mg  Status:  Discontinued     600 mg 300 mL/hr over 60 Minutes Intravenous Every 12 hours 07/06/12 1610 07/06/12 1619   07/06/12 1300  piperacillin-tazobactam (ZOSYN) IVPB 3.375 g     3.375 g 100 mL/hr over 30 Minutes Intravenous  Once 07/06/12 1250 07/06/12 1450   07/06/12 1300  clindamycin (CLEOCIN) IVPB 600 mg     600 mg 100 mL/hr over 30 Minutes Intravenous  Once 07/06/12 1253 07/06/12 1352   07/06/12 1130  vancomycin (VANCOCIN) IVPB 1000 mg/200 mL premix     1,000 mg 200 mL/hr over 60 Minutes Intravenous  Once 07/06/12 1117 07/06/12 1230       Assessment/Plan Left thigh abscess s/p I and D (multiple): wound stable, seems to be responding to hydrotherapy probably not small enough to apply wound vac yet. Will continue to monitor, MWF.       LOS: 14 days    DORT, Cobey Raineri 07/20/2012, 10:53 AM Pager: [phone]

## 2012-07-20 NOTE — Progress Notes (Signed)
Patient complaining of bladder spasms which she states she has had since admission and foley insertion. Catheter is draining. Small clots and sediment was also found in patients urine. No frank blood was noted. MD was notified of all changes.

## 2012-07-20 NOTE — Progress Notes (Signed)
Physical Therapy Wound Treatment Patient Details  Name: [name] [name] MRN: [id number] Date of Birth: Jul 14, 1975  Today's Date: 07/20/2012 Time: [id number] Time Calculation (min): 76 min  Subjective  Subjective: Pt agreeable to hydrotherapy today; pain has been better managed with the dilauded  Patient and Family Stated Goals: heal the wound Date of Onset:  (prior to admission) Prior Treatments: Surgical I&D (multiple)  Pain Score: Pain Score: 7/10- post 2mg  Dilauded   Wound Assessment   Wound beds overall appear with large amounts of red beefy tissue covered in yellow soft slough.  Able to debride small amounts of unviable tissue for all wounds. Concerns for posterior wound bed with base of hard yellow necrotic tissue. Will continue to monitor progress of this wound.  Santyl ointment applied over base of all wounds and into tunneling portions. Wounds packed and dressed. Will continue hydrotherapy as indicated.    07/20/12 1400  Subjective Assessment  Subjective Pt agreeable to hydrotherapy today; pain has been better managed with the dilauded   Patient and Family Stated Goals heal the wound  Date of Onset (prior to admission)  Prior Treatments Surgical I&D (multiple)  Evaluation and Treatment  Evaluation and Treatment Procedures Explained to Patient/Family Yes  Evaluation and Treatment Procedures agreed to  Wound 07/17/12 Perineum Medial Open abscess perineum and thigh   Date First Assessed/Time First Assessed: 07/17/12 1200   Location: Perineum  Location Orientation: Medial  Wound Description (Comments): Open abscess perineum and thigh   Present on Admission: Yes  Site / Wound Assessment Yellow;Granulation tissue;Pink;Black (Wound connection brideged with thigh)  % Wound base Red or Granulating 60%  % Wound base Yellow 20%  % Wound base Black 10%  % Wound base Other (Comment) 10% (subcutaneous fatty tissue; exposed muscle tissue)  Peri-wound Assessment Erythema  (blanchable);Maceration;Pink  Margins Unattacted edges (unapproximated)  Closure None  Drainage Amount Moderate  Drainage Description Serosanguineous  Treatment Debridement (Selective);Hydrotherapy (Pulse lavage);Packing (Saline gauze);Tape changed  Dressing Type ABD;Barrier Film (skin prep);Gauze (Comment);Moist to dry (santyl)  Dressing Changed Changed  Dressing Status Clean;Intact  Wound 07/17/12 Abdomen Left Necrotizing Faciitis  Date First Assessed: 07/17/12   Location: Abdomen  Location Orientation: Left  Wound Description (Comments): Necrotizing Faciitis  Site / Wound Assessment Brown;Dusky;Granulation tissue;Painful;Pink;Red;Yellow  % Wound base Red or Granulating 65%  % Wound base Yellow 35%  Peri-wound Assessment Erythema (blanchable)  Margins Unattacted edges (unapproximated)  Drainage Amount Minimal  Drainage Description Serosanguineous;Odor  Treatment Debridement (Selective);Hydrotherapy (Pulse lavage);Packing (Saline gauze);Tape changed  Dressing Type Gauze (Comment);Moist to dry  Dressing Changed Changed  Dressing Status Clean;Intact  Hydrotherapy  Pulsed Lavage with Suction (psi) 4 psi  Pulsed Lavage with Suction - Normal Saline Used 2000 mL  Pulsed Lavage Tip Tip with splash shield  Pulsed lavage therapy - wound location perinieum; and thigh wounds  Selective Debridement  Selective Debridement - Location perineum and thigh wound  Selective Debridement - Tools Used Forceps;Scissors  Selective Debridement - Tissue Removed necrotic tissue and unviable slough  Wound Therapy - Assess/Plan/Recommendations  Wound Therapy - Clinical Statement Wound beds overall appear with large amounts of red beefy tissue covered in yellow soft slough.  Able to debride small amounts of unviable tissue for all wounds. Concerns for posterior wound bed with base of hard yellow necrotic tissue. Will continue to monitor progress of this wound.  Santyl ointment applied over base of all wounds and  into tunneling portions. Wounds packed and dressed. Will continue hydrotherapy as indicated.   Wound Therapy -  Functional Problem List decreased mobility; increased pain, decreased skin itegrity  Factors Delaying/Impairing Wound Healing Altered sensation;Infection - systemic/local;Immobility;Vascular compromise  Hydrotherapy Plan Debridement;Dressing change;Patient/family education;Pulsatile lavage with suction  Wound Therapy - Frequency 6X / week  Wound Therapy - Follow Up Recommendations Home health RN  Wound Plan continue to treat as indicated  Wound Therapy Goals - Improve the function of patient's integumentary system by progressing the wound(s) through the phases of wound healing by:  Decrease Necrotic Tissue to <25%  Decrease Necrotic Tissue - Progress Progressing toward goal  Increase Granulation Tissue to >75%  Increase Granulation Tissue - Progress Progressing toward goal  Improve Drainage Characteristics Min  Improve Drainage Characteristics - Progress Progressing toward goal  Patient/Family will be able to  demontrate good physiological management contributions (eating well, participating in therapy; understanding of hygiene techniques)  Patient/Family Instruction Goal - Progress Progressing toward goal (patient with difficulty performing good hygiene techniques)  Goals/treatment plan/discharge plan were made with and agreed upon by patient/family Yes  Time For Goal Achievement 7 days  Wound Therapy - Potential for Goals Good    Alben Deeds, PT DPT  [phone]

## 2012-07-21 DIAGNOSIS — F329 Major depressive disorder, single episode, unspecified: Secondary | ICD-10-CM

## 2012-07-21 LAB — CBC
HCT: 22.2 % — ABNORMAL LOW (ref 36.0–46.0)
Hemoglobin: 7.5 g/dL — ABNORMAL LOW (ref 12.0–15.0)
MCH: 27 pg (ref 26.0–34.0)
MCHC: 33.8 g/dL (ref 30.0–36.0)
RBC: 2.78 MIL/uL — ABNORMAL LOW (ref 3.87–5.11)

## 2012-07-21 LAB — BASIC METABOLIC PANEL
BUN: 20 mg/dL (ref 6–23)
CO2: 27 mEq/L (ref 19–32)
GFR calc non Af Amer: 48 mL/min — ABNORMAL LOW (ref >90)
Glucose, Bld: 127 mg/dL — ABNORMAL HIGH (ref 70–99)
Potassium: 4.2 mEq/L (ref 3.5–5.1)
Sodium: 138 mEq/L (ref 135–145)

## 2012-07-21 LAB — GLUCOSE, CAPILLARY
Glucose-Capillary: 145 mg/dL — ABNORMAL HIGH (ref 70–99)
Glucose-Capillary: 173 mg/dL — ABNORMAL HIGH (ref 70–99)
Glucose-Capillary: 92 mg/dL (ref 70–99)

## 2012-07-21 NOTE — Progress Notes (Signed)
PT Hydrotherapy Note   07/21/12 1116  Subjective Assessment  Subjective patient reporting "spasms" in bladder and feel that catheter is not in correctly.  PT notified nursing.  Evaluation and Treatment  Evaluation and Treatment Procedures Explained to Patient/Family Yes  Evaluation and Treatment Procedures agreed to  Wound 07/17/12 Perineum Medial Open abscess perineum and thigh   Date First Assessed/Time First Assessed: 07/17/12 1200   Location: Perineum  Location Orientation: Medial  Wound Description (Comments): Open abscess perineum and thigh   Present on Admission: Yes  Site / Wound Assessment Painful;Pink;Yellow;Red  % Wound base Red or Granulating 60%  % Wound base Yellow 30%  % Wound base Black 0%  % Wound base Other (Comment) 10% (subcutaneous fatty tissue, exposed muscle tissue)  Peri-wound Assessment Erythema (blanchable);Maceration;Pink  Margins Unattacted edges (unapproximated)  Closure None  Drainage Amount Moderate  Drainage Description Serosanguineous  Treatment Debridement (Selective);Hydrotherapy (Pulse lavage);Packing (Saline gauze);Tape changed  Dressing Type ABD;Gauze (Comment);Moist to dry (santyl)  Dressing Changed Changed  Dressing Status Clean;Dry;Intact  Wound 07/17/12 Abdomen Left Necrotizing Faciitis  Date First Assessed: 07/17/12   Location: Abdomen  Location Orientation: Left  Wound Description (Comments): Necrotizing Faciitis  Site / Wound Assessment Dusky;Granulation tissue;Painful;Pale;Pink;Yellow  % Wound base Red or Granulating 65%  % Wound base Yellow 35%  % Wound base Black 0%  % Wound base Other (Comment) 0%  Peri-wound Assessment Erythema (blanchable)  Margins Unattacted edges (unapproximated)  Closure None  Drainage Amount Moderate  Drainage Description Serosanguineous  Treatment Hydrotherapy (Pulse lavage);Packing (Saline gauze);Tape changed  Dressing Type ABD;Gauze (Comment);Moist to dry (santyl)  Dressing Changed Changed  Dressing  Status Clean;Dry;Intact  Hydrotherapy  Pulsed Lavage with Suction (psi) 4 psi  Pulsed Lavage with Suction - Normal Saline Used 2000 mL  Pulsed Lavage Tip Tip with splash shield  Pulsed lavage therapy - wound location perinieum; and thigh wounds  Selective Debridement  Selective Debridement - Location perineum and thigh wound  Selective Debridement - Tools Used Forceps;Scissors  Selective Debridement - Tissue Removed slough  Wound Therapy - Assess/Plan/Recommendations  Wound Therapy - Clinical Statement Wound beds overall appear with large amounts of red beefy tissue covered in yellow soft slough. Able to debride small amounts of slough in wounds. Santyl ointment applied over base of all wounds and into tunneling portions. Wounds packed and dressed. Will continue hydrotherapy as indicated  Wound Therapy - Functional Problem List decreased mobility; increased pain, decreased skin itegrity  Factors Delaying/Impairing Wound Healing Altered sensation;Infection - systemic/local;Immobility;Vascular compromise  Hydrotherapy Plan Debridement;Dressing change;Patient/family education;Pulsatile lavage with suction  Wound Therapy - Frequency 6X / week  Wound Therapy - Follow Up Recommendations Home health RN  Wound Plan continue to treat as indicated  Wound Therapy Goals - Improve the function of patient's integumentary system by progressing the wound(s) through the phases of wound healing by:  Decrease Necrotic Tissue to <25%  Decrease Necrotic Tissue - Progress Progressing toward goal  Increase Granulation Tissue to >75%  Increase Granulation Tissue - Progress Progressing toward goal  Improve Drainage Characteristics Min  Improve Drainage Characteristics - Progress Progressing toward goal  Patient/Family will be able to  demontrate good physiological management contributions (eating well, participating in therapy; understanding of hygiene techniques)  Patient/Family Instruction Goal - Progress  Progressing toward goal  07/21/2012 [facility], [place]

## 2012-07-21 NOTE — Progress Notes (Signed)
Subjective: No acute events overnight. Already underwent hydrotherapy this morning. Asks about progress of her health, but no other complaints.  She does report a constant dull sensation of pain, but pain medication does help.  Objective: Vital signs in last 24 hours: Filed Vitals:   07/20/12 1644 07/20/12 2158 07/21/12 0536 07/21/12 1005  BP: 135/87 133/85 129/73 139/92  Pulse: 102 95 91 98  Temp: 98.3 F (36.8 C) 98.3 F (36.8 C) 97.6 F (36.4 C) 98.5 F (36.9 C)  TempSrc:  Oral Oral Oral  Resp: 21 20 18 18   Height:      Weight:      SpO2: 97% 99% 100% 100%   Weight change:   Intake/Output Summary (Last 24 hours) at 07/21/12 1300 Last data filed at 07/21/12 1222  Gross per 24 hour  Intake   1360 ml  Output   6401 ml  Net  -5041 ml   General: resting in bed,NAD, anxious  HEENT: PERRL, EOMI, no scleral icterus  Cardiac: RRR, no rubs, murmurs or gallops  Pulm: clear to auscultation bilaterally, moving normal volumes of air  Abd: soft, nontender, distended, morbidly obese, BS present  Ext: warm and well perfused, no pedal edema, scattered pustules over face and UE all different stages of healing, left groin dressing from medial thigh to left labia; foley without gross hematuria  Neuro: alert and oriented X3, cranial nerves II-XII grossly intact, depressed affect  Lab Results: Basic Metabolic Panel:  Recent Labs Lab 07/20/12 0605 07/21/12 0600  NA 139 138  K 4.1 4.2  CL 107 105  CO2 26 27  GLUCOSE 95 127*  BUN 21 20  CREATININE 1.46* 1.40*  CALCIUM 8.3* 8.1*   Liver Function Tests:  Recent Labs Lab 07/15/12 1700  AST 12  ALT 7  ALKPHOS 109  BILITOT 0.2*  PROT 6.5  ALBUMIN 1.3*   CBC:  Recent Labs Lab 07/20/12 0605 07/21/12 0600  WBC 5.9 5.0  HGB 7.6* 7.5*  HCT 23.0* 22.2*  MCV 79.9 79.9  PLT 341 350   Cardiac Enzymes:  Recent Labs Lab 07/15/12 0955  CKTOTAL 168   CBG:  Recent Labs Lab 07/20/12 1204 07/20/12 1646 07/20/12 2050  07/21/12 0009 07/21/12 0806 07/21/12 1200  GLUCAP 97 92 163* 173* 105* 92    Medications: I have reviewed the patient's current medications. Scheduled Meds: . amitriptyline  50 mg Oral QHS  . bd getting started take home kit  1 kit Other Once  . clindamycin (CLEOCIN) IV  600 mg Intravenous Q8H  . collagenase   Topical Daily  . diphenhydrAMINE  25 mg Intravenous Once  . feeding supplement  237 mL Oral Q24H  . feeding supplement  30 mL Oral BID  . FLUoxetine  40 mg Oral Daily  . furosemide  40 mg Intravenous Daily  . heparin subcutaneous  5,000 Units Subcutaneous Q8H  . HYDROcodone-acetaminophen  1-2 tablet Oral Q4H  . insulin aspart  0-20 Units Subcutaneous TID WC  . insulin aspart  0-5 Units Subcutaneous QHS  . insulin aspart  6 Units Subcutaneous TID WC  . insulin glargine  15 Units Subcutaneous QHS  . multivitamin with minerals  1 tablet Oral Daily  . sodium chloride  10-40 mL Intracatheter Q12H   Continuous Infusions:  PRN Meds:.diphenhydrAMINE, HYDROmorphone (DILAUDID) injection, ondansetron (ZOFRAN) IV, sodium chloride   Assessment/Plan: Patient is a 37 yo F with history of uncontrolled DM, HTN, HLD, anxiety and depression who presented on 07/06/12 with swelling/pain/drainage from her  left groin.  # Necrotizing fascitis: Pt was admitted on 2/14 and taken emergently to OR for surgical debridement of site suspicious for nec fasc. Over L groin. Pt was then immediately started on Vanc/zosyn/clinda. Repeat debridement of necrotic tissue on 2/15, 2/16, 2/17. Minimal blood loss. Wound culture --> MRSA sensitive to clinda.  -continue Clindamycin (day 15 - optimal duration of abx therapy not defined, should be continued until no further debridements are needed) -Hydrotherapy -Pain mgmt with Norco 5-325, 1-2 tab q4h prn, dilaudid 1-2mg  q4h prn (esp prior to hydrotherapy) -surgery following considering wound vac once size decreases   # ATN: resolving; Pt Cr 1.46 --> 1.4 (downtrending  and responding to fluids) today from presumed baseline of 0.7 on admission. Previous UA shows waxy/granular casts and uric acid crystals along with hematuria (RBCs 21-50). Some of hematuria maybe 2/2 traumatic foley insertion.  -continue lasix 40mg  once daily  -continue foley with irrigations  -follow AM labs   # Acute Anemia: Pt baseline Hgb 10 and today 7.5. Review of intraoperative notes doesn't appear to be etiology of bleeding. No active sites of bleeding. Likely related to critical illness. -monitor CBC  -transfuse <7.0  -repeat anemia panel  # Poorly controlled DM type 2: Hgb A1c 15.4 and outpt meds include metformin and glipizide. Pt initially presented in DKA on 2/14 with AG of 24 that resolved after fluids. No ketones in urine. Pt had episodes of hypoglycemia into the 60s so Lantus decreased to 15U.  -cont SSI with lantus and Novolog 6u TIDWC -carb modified diet -will need to d/c with Novolog 70/30 d/t cost  #Depression: Stable, no SI/HI, continue home prozac  Dispo: Disposition is deferred at this time, awaiting improvement of current medical problems.  Anticipated discharge in approximately 2-3 day(s).   The patient does have a current PCP (PRIBULA,CHRISTOPHER, MD), therefore will be requiring OPC follow-up after discharge.   The patient does not have transportation limitations that hinder transportation to clinic appointments.  LOS: 15 days   [name] [name] 07/21/2012, 1:00 PM

## 2012-07-22 LAB — BASIC METABOLIC PANEL
BUN: 20 mg/dL (ref 6–23)
CO2: 30 mEq/L (ref 19–32)
Calcium: 8.5 mg/dL (ref 8.4–10.5)
Chloride: 103 mEq/L (ref 96–112)
Creatinine, Ser: 1.32 mg/dL — ABNORMAL HIGH (ref 0.50–1.10)
GFR calc Af Amer: 59 mL/min — ABNORMAL LOW (ref >90)
GFR calc non Af Amer: 51 mL/min — ABNORMAL LOW (ref >90)
Glucose, Bld: 101 mg/dL — ABNORMAL HIGH (ref 70–99)
Potassium: 4.5 mEq/L (ref 3.5–5.1)
Sodium: 137 mEq/L (ref 135–145)

## 2012-07-22 LAB — VITAMIN B12: Vitamin B-12: 693 pg/mL (ref 211–911)

## 2012-07-22 LAB — IRON AND TIBC
Iron: 22 ug/dL — ABNORMAL LOW (ref 42–135)
Saturation Ratios: 14 % — ABNORMAL LOW (ref 20–55)
TIBC: 158 ug/dL — ABNORMAL LOW (ref 250–470)
UIBC: 136 ug/dL (ref 125–400)

## 2012-07-22 LAB — GLUCOSE, CAPILLARY
Glucose-Capillary: 94 mg/dL (ref 70–99)
Glucose-Capillary: 70 mg/dL (ref 70–99)

## 2012-07-22 LAB — CBC
HCT: 25.3 % — ABNORMAL LOW (ref 36.0–46.0)
Hemoglobin: 8.2 g/dL — ABNORMAL LOW (ref 12.0–15.0)
MCH: 25.9 pg — ABNORMAL LOW (ref 26.0–34.0)
MCHC: 32.4 g/dL (ref 30.0–36.0)
MCV: 80.1 fL (ref 78.0–100.0)
Platelets: 405 10*3/uL — ABNORMAL HIGH (ref 150–400)
RBC: 3.16 MIL/uL — ABNORMAL LOW (ref 3.87–5.11)
RDW: 14 % (ref 11.5–15.5)
WBC: 5.9 10*3/uL (ref 4.0–10.5)

## 2012-07-22 LAB — RETICULOCYTES
RBC.: 3.16 MIL/uL — ABNORMAL LOW (ref 3.87–5.11)
Retic Count, Absolute: 25.3 10*3/uL (ref 19.0–186.0)
Retic Ct Pct: 0.8 % (ref 0.4–3.1)

## 2012-07-22 LAB — FOLATE: Folate: 6.6 ng/mL

## 2012-07-22 NOTE — Progress Notes (Signed)
Reviewed patient's telemetry. Patient has had a few episodes of sinus tachycardia, corresponding with pain from necrotizing fascitis. No other significant events in 24h.  Will discontinue tele.

## 2012-07-22 NOTE — Progress Notes (Signed)
Subjective: [name] [name] was seen and examined this morning at bedside.  No acute events overnight. POD#6 s/p last debridement left thigh abscess.  Underwent hydrotherapy yesterday. She reports occasional spasms that result in her having a strong urge to urinate or defecate.  These spasm's have only existed since her surgery.  Otherwise she denies any fever, chills, N/V/D, chest pain, shortness of breath, or abdominal pain at this time.    Objective: Vital signs in last 24 hours: Filed Vitals:   07/21/12 1300 07/21/12 1700 07/21/12 2125 07/22/12 0434  BP: 141/88 132/84 119/64 140/90  Pulse: 94 90 94 93  Temp: 98.1 F (36.7 C) 98 F (36.7 C) 98.4 F (36.9 C) 97.9 F (36.6 C)  TempSrc: Oral Oral Oral Oral  Resp: 18 18 18 20   Height:      Weight:   323 lb 13.7 oz (146.9 kg)   SpO2: 100% 100% 100% 99%   Weight change:   Intake/Output Summary (Last 24 hours) at 07/22/12 0739 Last data filed at 07/22/12 0428  Gross per 24 hour  Intake    950 ml  Output   5701 ml  Net  -4751 ml   General: resting in bed,NAD, anxious  HEENT: PERRL, EOMI, no scleral icterus  Cardiac: RRR, no rubs, murmurs or gallops  Pulm: clear to auscultation bilaterally, moving normal volumes of air  Abd: soft, nontender, distended, morbidly obese, BS present  Ext: warm and well perfused, no pedal edema, scattered hyperpigmented pustules over face and UE all different stages of healing, left groin dressing from medial thigh to left labia intact, dry; foley without gross hematuria  Neuro: alert and oriented X3, cranial nerves II-XII grossly intact, depressed affect  Lab Results: Basic Metabolic Panel:  Recent Labs Lab 07/21/12 0600 07/22/12 0425  NA 138 137  K 4.2 4.5  CL 105 103  CO2 27 30  GLUCOSE 127* 101*  BUN 20 20  CREATININE 1.40* 1.32*  CALCIUM 8.1* 8.5   Liver Function Tests:  Recent Labs Lab 07/15/12 1700  AST 12  ALT 7  ALKPHOS 109  BILITOT 0.2*  PROT 6.5  ALBUMIN 1.3*    CBC:  Recent Labs Lab 07/21/12 0600 07/22/12 0425  WBC 5.0 5.9  HGB 7.5* 8.2*  HCT 22.2* 25.3*  MCV 79.9 80.1  PLT 350 405*   Cardiac Enzymes:  Recent Labs Lab 07/15/12 0955  CKTOTAL 168   CBG:  Recent Labs Lab 07/20/12 1646 07/20/12 2050 07/21/12 0009 07/21/12 0806 07/21/12 1200 07/21/12 2130  GLUCAP 92 163* 173* 105* 92 145*   Medications: I have reviewed the patient's current medications. Scheduled Meds: . amitriptyline  50 mg Oral QHS  . bd getting started take home kit  1 kit Other Once  . clindamycin (CLEOCIN) IV  600 mg Intravenous Q8H  . collagenase   Topical Daily  . diphenhydrAMINE  25 mg Intravenous Once  . feeding supplement  237 mL Oral Q24H  . feeding supplement  30 mL Oral BID  . FLUoxetine  40 mg Oral Daily  . furosemide  40 mg Intravenous Daily  . heparin subcutaneous  5,000 Units Subcutaneous Q8H  . HYDROcodone-acetaminophen  1-2 tablet Oral Q4H  . insulin aspart  0-20 Units Subcutaneous TID WC  . insulin aspart  0-5 Units Subcutaneous QHS  . insulin aspart  6 Units Subcutaneous TID WC  . insulin glargine  15 Units Subcutaneous QHS  . multivitamin with minerals  1 tablet Oral Daily  . sodium chloride  10-40 mL Intracatheter Q12H   Continuous Infusions:  PRN Meds:.diphenhydrAMINE, HYDROmorphone (DILAUDID) injection, ondansetron (ZOFRAN) IV, sodium chloride  Assessment/Plan: Patient is a 37 yo F with history of uncontrolled DM, HTN, HLD, anxiety and depression who presented on 07/06/12 with swelling/pain/drainage from her left groin and taken emergently to OR for surgical debridement.  S/p I/D and multiple debridements this admission.    # Necrotizing fascitis: admitted on 2/14 and taken emergently to OR for surgical debridement of site suspicious for nec fasc. Over L groin. Pt was then immediately started on Vanc/zosyn/clinda. Repeat debridement of necrotic tissue on 2/15, 2/16, 2/17, 2/24. Minimal blood loss. Wound culture --> MRSA  sensitive to clinda.  -continue Clindamycin (day 15 - optimal duration of abx therapy not defined, should be continued until no further debridements are needed) -continue hydrotherapy -Pain mgmt with Norco 5-325, 1-2 tab q4h prn, dilaudid 1-2mg  q4h prn (esp prior to hydrotherapy) -surgery following, considering wound vac once size decreases   # ATN: resolving; Pt Cr 1.46 --> 1.32 (downtrending and responding to fluids) today from presumed baseline of 0.7 on admission. Previous UA shows waxy/granular casts and uric acid crystals along with hematuria (RBCs 21-50). Some of hematuria maybe 2/2 traumatic foley insertion.  -continue lasix 40mg  po qd -continue foley with irrigations  -follow AM labs   # Acute Anemia: Pt baseline Hgb 10 and today 8.2 and improving. Review of intraoperative notes doesn't appear to be etiology of bleeding. No active sites of bleeding. Likely related to critical illness. -monitor CBC  -transfuse <7.0  -repeat anemia panel  # Poorly controlled DM type 2: Hgb A1c 15.4 and outpt meds include metformin and glipizide. Pt initially presented in DKA on 2/14 with AG of 24 that resolved after fluids. No ketones in urine. Pt had episodes of hypoglycemia into the 60s so Lantus decreased to 15U.  -cont SSI with lantus and Novolog 6u TIDWC -carb modified diet -will need to d/c with Novolog 70/30 d/t cost  #Depression: Stable, no SI/HI -continue home prozac  Diet:Carb modified DVT Ppx: heparin Dispo: Disposition is deferred at this time, awaiting improvement of current medical problems.  Anticipated discharge in approximately 2-3 day(s).   The patient does have a current PCP (PRIBULA,CHRISTOPHER, MD), therefore will be requiring OPC follow-up after discharge.   The patient does not have transportation limitations that hinder transportation to clinic appointments.  LOS: 16 days   Jerene Pitch 07/22/2012, 7:39 AM

## 2012-07-23 LAB — BASIC METABOLIC PANEL
CO2: 32 mEq/L (ref 19–32)
Calcium: 8.8 mg/dL (ref 8.4–10.5)
Creatinine, Ser: 1.24 mg/dL — ABNORMAL HIGH (ref 0.50–1.10)
GFR calc non Af Amer: 55 mL/min — ABNORMAL LOW (ref >90)
Sodium: 136 mEq/L (ref 135–145)

## 2012-07-23 LAB — GLUCOSE, CAPILLARY: Glucose-Capillary: 145 mg/dL — ABNORMAL HIGH (ref 70–99)

## 2012-07-23 LAB — CK: Total CK: 49 U/L (ref 7–177)

## 2012-07-23 MED ORDER — INSULIN ASPART 100 UNIT/ML ~~LOC~~ SOLN
4.0000 [IU] | Freq: Three times a day (TID) | SUBCUTANEOUS | Status: DC
  Administered 2012-07-24 – 2012-07-26 (×8): 4 [IU] via SUBCUTANEOUS

## 2012-07-23 MED ORDER — INSULIN ASPART 100 UNIT/ML ~~LOC~~ SOLN
0.0000 [IU] | Freq: Three times a day (TID) | SUBCUTANEOUS | Status: DC
  Administered 2012-07-23: 3 [IU] via SUBCUTANEOUS
  Administered 2012-07-25 – 2012-07-26 (×2): 2 [IU] via SUBCUTANEOUS
  Administered 2012-07-26: 3 [IU] via SUBCUTANEOUS

## 2012-07-23 NOTE — Progress Notes (Signed)
Internal Medicine Teaching Service Attending Note Date: 07/23/2012  Patient name: [name] [name]  Medical record number: [id number]  Date of birth: 12-17-1975  I saw [name] [name] today when she had just about completed hydrotherapy. I have reviewed her chart in detail and have discussed her care with my team, Dr. Burnard Bunting adn Dr. Para Skeans. [name] [name] says she feels better, and offers no new complaints today.   Filed Vitals:   07/23/12 1442  BP: 137/76  Pulse: 101  Temp: 98.5 F (36.9 C)  Resp: 20   Exam:  General - Obese woman, lying, no acute distress.  Heart - No abnormalities Lung - limited exam, patient did not turn.  Abdomen - Non-tender, Non-distended, soft. Extremities - bandaged wound on left groin. No pedal edema.    Plan - We continue hydrotherapy and follow surgery for necrotizing fascitis issue.  - Clindamycin to continue.  - Adequate pain management.  - Kidney function improving.  Recent Labs Lab 07/19/12 0500 07/20/12 0605 07/21/12 0600 07/22/12 0425 07/23/12 0450  CREATININE 1.71* 1.46* 1.40* 1.32* 1.24*   - On insulin SSI hypoglycemia protocol.  Recent Labs Lab 07/22/12 1708 07/22/12 2143 07/23/12 0732 07/23/12 1133 07/23/12 1220  GLUCAP 164* 78 105* 61* 86   At some point transition to Novoloh 70/30 due to cost.   Rest according to resident note.   [place], [place] 07/23/2012, 4:03 PM.

## 2012-07-23 NOTE — Progress Notes (Signed)
Physical Therapy Wound Treatment Patient Details  Name: [name] [name] MRN: [id number] Date of Birth: 06-Nov-1975  Today's Date: 07/23/2012 Time: [id number] Time Calculation (min): 56 min  Subjective  Subjective: Pt with no new complaints today, still reporting general itching from healing process Patient and Family Stated Goals: heal the wounds  Pain Score: Pain Score:   7  Wound Assessment    07/23/12 1000  Subjective Assessment  Subjective Pt with no new complaints today, still reporting general itching from healing process  Patient and Family Stated Goals heal the wounds  Evaluation and Treatment  Evaluation and Treatment Procedures Explained to Patient/Family Yes  Evaluation and Treatment Procedures agreed to  Wound 07/17/12 Perineum Medial Open abscess perineum and thigh   Date First Assessed/Time First Assessed: 07/17/12 1200   Location: Perineum  Location Orientation: Medial  Wound Description (Comments): Open abscess perineum and thigh   Present on Admission: Yes  Site / Wound Assessment Painful;Pink;Yellow;Red  % Wound base Red or Granulating 75%  % Wound base Yellow 20%  % Wound base Black 0%  % Wound base Other (Comment) 5% (subcutaneous fatty tissue, exposed muscle tissue)  Peri-wound Assessment Erythema (blanchable);Maceration;Pink  Wound Length (cm) 24 cm  Wound Width (cm) 20 cm  Wound Depth (cm) 5 cm  Undermining (cm) 3; 2.5 (3 cm proximally; 2.5 medial crevace)  Margins Unattacted edges (unapproximated)  Closure None  Drainage Amount Moderate  Drainage Description Serosanguineous  Treatment Debridement (Selective);Hydrotherapy (Pulse lavage);Packing (Saline gauze);Tape changed  Dressing Type ABD;Gauze (Comment);Moist to dry (santyl)  Dressing Changed Changed  Dressing Status Clean;Dry;Intact  Wound 07/17/12 Abdomen Left Necrotizing Faciitis  Date First Assessed: 07/17/12   Location: Abdomen  Location Orientation: Left  Wound Description (Comments):  Necrotizing Faciitis  Site / Wound Assessment Granulation tissue;Painful;Pale;Pink;Yellow  % Wound base Red or Granulating 75%  % Wound base Yellow 25%  % Wound base Black 0%  % Wound base Other (Comment) 0%  Peri-wound Assessment Erythema (blanchable)  Wound Length (cm) 4 cm  Wound Width (cm) 3.5 cm  Wound Depth (cm) 5 cm  Margins Unattacted edges (unapproximated)  Closure None  Drainage Amount Minimal  Drainage Description Serosanguineous  Dressing Type ABD;Gauze (Comment);Moist to dry (santyl)  Dressing Changed Changed  Dressing Status Clean;Dry;Intact  Hydrotherapy  Pulsed Lavage with Suction (psi) 4 psi  Pulsed Lavage with Suction - Normal Saline Used 2000 mL  Pulsed Lavage Tip Tip with splash shield  Pulsed lavage therapy - wound location perinieum; and thigh wounds  Selective Debridement  Selective Debridement - Location perineum and thigh wound  Selective Debridement - Tools Used Forceps;Scissors  Selective Debridement - Tissue Removed slough  Wound Therapy - Assess/Plan/Recommendations  Wound Therapy - Clinical Statement Upon reassessment today; wound beds overall appear healthy and with large amounts of red beefy tissue with some coverage in yellow soft slough. Abdominal wound appears to be closing of from the base of the tunnel with decreased depth.  Continued selective debridement of small amounts of slough in wounds. Santyl ointment applied over base of all wounds and into tunneling portions. Wounds packed and dressed. Will continue hydrotherapy as indicated; recommend continued use of santyl for chemical debridement and assess for potential of wound vac if wound continues to progress with healing.    Wound Therapy - Functional Problem List decreased mobility; increased pain, decreased skin itegrity  Factors Delaying/Impairing Wound Healing Altered sensation;Infection - systemic/local;Immobility;Vascular compromise  Hydrotherapy Plan Debridement;Dressing  change;Patient/family education;Pulsatile lavage with suction  Wound Therapy - Frequency 6X /  week  Wound Therapy - Follow Up Recommendations Home health RN;Other (comment) (vs SNF pending plan of care for wound (Vac vs hydrotherapy))  Wound Plan continue to treat as indicated  Wound Therapy Goals - Improve the function of patient's integumentary system by progressing the wound(s) through the phases of wound healing by:  Decrease Necrotic Tissue to <10% all wounds  Decrease Necrotic Tissue - Progress Goal set today  Increase Granulation Tissue to >90% all wounds  Increase Granulation Tissue - Progress Goal set today  Improve Drainage Characteristics Min  Improve Drainage Characteristics - Progress Partly met  Patient/Family will be able to  demontrate good physiological management contributions (eating well, participating in therapy; understanding of hygiene techniques)  Patient/Family Instruction Goal - Progress Met  Goals/treatment plan/discharge plan were made with and agreed upon by patient/family Yes  Time For Goal Achievement 7 days  Wound Therapy - Potential for Goals Good    Alben Deeds, PT DPT  [phone]

## 2012-07-23 NOTE — Progress Notes (Signed)
Subjective:    Continues to complain of gluteal pain and a "burning" pain in her suprapubic region, which she states are unchanged. She denies fever/chills. Denies CP or SOB. Denies N/V/D and states she has been eating well.   Interval Events: No acute events.    Objective:    Vital Signs:   Temp:  [97.4 [name] (36.3 C)-98.6 [name] (37 C)] 97.6 [name] (36.4 C) (03/03 0441) Pulse Rate:  [83-96] 96 (03/03 0854) Resp:  [18-20] 18 (03/03 0854) BP: (122-160)/(75-98) 129/77 mmHg (03/03 0854) SpO2:  [98 %-100 %] 98 % (03/03 0854) Weight:  [311 lb 4.6 oz (141.2 kg)] 311 lb 4.6 oz (141.2 kg) (03/02 2143) Last BM Date: 07/22/12  24-hour weight change: Weight change: -12 lb 9.1 oz (-5.7 kg)  Intake/Output:   Intake/Output Summary (Last 24 hours) at 07/23/12 0905 Last data filed at 07/23/12 0854  Gross per 24 hour  Intake    600 ml  Output   4602 ml  Net  -4002 ml      Physical Exam: General: Vital signs reviewed and noted. Well-developed, well-nourished, in no acute distress; alert, appropriate and cooperative throughout examination.  Lungs:  Normal respiratory effort. Clear to auscultation BL without crackles or wheezes.  Heart: RRR. S1 and S2 normal without gallop, murmur, or rubs.  Abdomen: BS normoactive. Soft, Nondistended, non-tender.  No masses or organomegaly.  Extremities/buttocks/groin No pretibial edema. Buttock/groin dressing c/d/i.     Labs:  Basic Metabolic Panel:  Recent Labs Lab 07/19/12 0500 07/20/12 0605 07/21/12 0600 07/22/12 0425 07/23/12 0450  NA 138 139 138 137 136  K 3.8 4.1 4.2 4.5 4.5  CL 107 107 105 103 100  CO2 25 26 27 30  32  GLUCOSE 64* 95 127* 101* 98  BUN 19 21 20 20 19   CREATININE 1.71* 1.46* 1.40* 1.32* 1.24*  CALCIUM 8.3* 8.3* 8.1* 8.5 8.8   CBC:  Recent Labs Lab 07/18/12 0433 07/19/12 0500 07/20/12 0605 07/21/12 0600 07/22/12 0425  WBC 8.8 7.8 5.9 5.0 5.9  HGB 7.9* 7.8* 7.6* 7.5* 8.2*  HCT 23.0* 23.1* 23.0* 22.2* 25.3*  MCV  79.0 78.8 79.9 79.9 80.1  PLT 321 368 341 350 405*    Cardiac Enzymes:  Recent Labs Lab 07/23/12 0450  CKTOTAL 49    CBG:  Recent Labs Lab 07/22/12 0739 07/22/12 1158 07/22/12 1708 07/22/12 2143 07/23/12 0732  GLUCAP 94 70 164* 53 105*    Microbiology: Results for orders placed during the hospital encounter of 07/06/12  CULTURE, BLOOD (ROUTINE X 2)     Status: None   Collection Time    07/06/12 10:50 AM      Result Value Range Status   Specimen Description BLOOD LEFT ANTECUBITAL   Final   Special Requests BOTTLES DRAWN AEROBIC ONLY 10CC   Final   Culture  Setup Time 07/06/2012 18:19   Final   Culture NO GROWTH 5 DAYS   Final   Report Status 07/12/2012 FINAL   Final  CULTURE, BLOOD (ROUTINE X 2)     Status: None   Collection Time    07/06/12 11:00 AM      Result Value Range Status   Specimen Description BLOOD RIGHT ANTECUBITAL   Final   Special Requests BOTTLES DRAWN AEROBIC ONLY 2CC   Final   Culture  Setup Time 07/06/2012 18:19   Final   Culture NO GROWTH 5 DAYS   Final   Report Status 07/12/2012 FINAL   Final  WOUND CULTURE  Status: None   Collection Time    07/06/12  3:11 PM      Result Value Range Status   Specimen Description WOUND THIGH LEFT   Final   Special Requests PT ON ZOSYN,VANCOMYCIN AND CLEOCIN   Final   Gram Stain     Final   Value: RARE WBC PRESENT, PREDOMINANTLY PMN     NO SQUAMOUS EPITHELIAL CELLS SEEN     ABUNDANT GRAM POSITIVE RODS     FEW GRAM POSITIVE COCCI     IN PAIRS IN CLUSTERS   Culture     Final   Value: RARE METHICILLIN RESISTANT STAPHYLOCOCCUS AUREUS     Note: RIFAMPIN AND GENTAMICIN SHOULD NOT BE USED AS SINGLE DRUGS FOR TREATMENT OF STAPH INFECTIONS. This organism DOES NOT demonstrate inducible Clindamycin resistance in vitro. CRITICAL RESULT CALLED TO, READ BACK BY AND VERIFIED WITH: ROBIN WOFFORD BY      INGRAM A 2/18 8AM   Report Status 07/10/2012 FINAL   Final   Organism ID, Bacteria METHICILLIN RESISTANT  STAPHYLOCOCCUS AUREUS   Final  ANAEROBIC CULTURE     Status: None   Collection Time    07/06/12  3:11 PM      Result Value Range Status   Specimen Description WOUND THIGH LEFT   Final   Special Requests PT ON ZOSYN,VANCOMYCIN AND CLEOCIN   Final   Gram Stain     Final   Value: RARE WBC PRESENT, PREDOMINANTLY PMN     NO SQUAMOUS EPITHELIAL CELLS SEEN     ABUNDANT GRAM POSITIVE RODS     FEW GRAM POSITIVE COCCI IN PAIRS     IN CLUSTERS   Culture NO ANAEROBES ISOLATED   Final   Report Status 07/11/2012 FINAL   Final  MRSA PCR SCREENING     Status: Abnormal   Collection Time    07/06/12  8:12 PM      Result Value Range Status   MRSA by PCR POSITIVE (*) NEGATIVE Final   Comment:            The GeneXpert MRSA Assay (FDA     approved for NASAL specimens     only), is one component of a     comprehensive MRSA colonization     surveillance program. It is not     intended to diagnose MRSA     infection nor to guide or     monitor treatment for     MRSA infections.     RESULT CALLED TO, READ BACK BY AND VERIFIED WITH:     S COGGINS RN 2.14.14 AT 2215 BY ROMEROJ      Medications:    Infusions:    Scheduled Medications: . amitriptyline  50 mg Oral QHS  . bd getting started take home kit  1 kit Other Once  . clindamycin (CLEOCIN) IV  600 mg Intravenous Q8H  . collagenase   Topical Daily  . diphenhydrAMINE  25 mg Intravenous Once  . feeding supplement  237 mL Oral Q24H  . feeding supplement  30 mL Oral BID  . FLUoxetine  40 mg Oral Daily  . furosemide  40 mg Intravenous Daily  . heparin subcutaneous  5,000 Units Subcutaneous Q8H  . HYDROcodone-acetaminophen  1-2 tablet Oral Q4H  . insulin aspart  0-20 Units Subcutaneous TID WC  . insulin aspart  0-5 Units Subcutaneous QHS  . insulin aspart  6 Units Subcutaneous TID WC  . insulin glargine  15 Units Subcutaneous QHS  .  multivitamin with minerals  1 tablet Oral Daily  . sodium chloride  10-40 mL Intracatheter Q12H    PRN  Medications: diphenhydrAMINE, HYDROmorphone (DILAUDID) injection, ondansetron (ZOFRAN) IV, sodium chloride   Assessment/ Plan:   Patient is a 37 yo [name] with history of uncontrolled DM, HTN, HLD, anxiety and depression who presented on 07/06/12 with swelling/pain/drainage from her left groin and taken emergently to OR for surgical debridement. S/p I/D and multiple debridements this admission.   Necrotizing fascitis: admitted on 2/14 and taken emergently to OR for surgical debridement of site suspicious for nec fasc. Over L groin. Pt was then immediately started on Vanc/zosyn/clinda. Repeat debridement of necrotic tissue on 2/15, 2/16, 2/17, 2/24. Minimal blood loss. Wound culture --> MRSA sensitive to clinda.  -continue Clindamycin (day 17 - optimal duration of abx therapy not defined, should be continued until no further debridements are needed)  -continue hydrotherapy  -Pain mgmt with Norco 5-325, 1-2 tab q4h prn, dilaudid 1-2mg  q4h prn (esp prior to hydrotherapy)  -surgery following, considering wound vac once size decreases   ATN: resolving; Pt Cr 1.32 => 1.24 (downtrending and responding to fluids) today from presumed baseline of 0.7 on admission. Previous UA shows waxy/granular casts and uric acid crystals along with hematuria (RBCs 21-50). Some of hematuria maybe 2/2 traumatic foley insertion.  -continue lasix 40mg  po qd  -continue foley with irrigations  -follow AM labs   Acute Anemia: Pt baseline Hgb 10 and 8.2 and improving as of 3/2. Review of intraoperative notes doesn't appear to be etiology of bleeding. No active sites of bleeding. Likely related to critical illness.  -monitor CBC  -transfuse <7.0  -repeat anemia panel   Poorly controlled DM type 2: Hgb A1c 15.4 and outpt meds include metformin and glipizide. Pt initially presented in DKA on 2/14 with AG of 24 that resolved after fluids. No ketones in urine. Pt had episodes of hypoglycemia into the 60s so Lantus decreased to 15U.    -cont SSI with lantus and Novolog 6u TIDWC  -carb modified diet  -will need to d/c with Novolog 70/30 d/t cost   Depression: Stable, no SI/HI  -continue home prozac   DVT PPX - heparin  CODE STATUS - full  CONSULTS PLACED - Gen surgery  DISPO - Disposition is deferred at this time, awaiting improvement of current medical problems. Will discuss with general surgery the most appropriate venue for patient discharge as she will likely have extensive needs early in her recovery.  Anticipated discharge in approximately 2-3 day(s).   The patient does have a current [facility] PCP (PRIBULA,CHRISTOPHER, MD) and does need an [facility] hospital follow-up appointment after discharge.    Is the [facility] hospital follow-up appointment a one-time only appointment? not applicable.  Does the patient have transportation limitations that hinder transportation to clinic appointments? no   SERVICE NEEDED AT [place]         Y = Yes, Blank = No PT:   OT:   RN:   Equipment:   Other:      Length of Stay: 17 day(s)   Signed: Gae Gallop, MD  PGY-1, Internal Medicine Resident Pager: [phone] (7AM-5PM) 07/23/2012, 9:05 AM

## 2012-07-23 NOTE — Progress Notes (Signed)
7 Days Post-Op  Subjective: Going thru dressing change and hydro Rx now. Fairly uncomfortable.  Objective: Vital signs in last 24 hours: Temp:  [97.4 F (36.3 C)-98.6 F (37 C)] 97.6 F (36.4 C) (03/03 0441) Pulse Rate:  [83-96] 96 (03/03 0854) Resp:  [18-20] 18 (03/03 0854) BP: (122-160)/(75-98) 129/77 mmHg (03/03 0854) SpO2:  [98 %-100 %] 98 % (03/03 0854) Weight:  [311 lb 4.6 oz (141.2 kg)] 311 lb 4.6 oz (141.2 kg) (03/02 2143) Last BM Date: 07/22/12  Intake/Output from previous day: 03/02 0701 - 03/03 0700 In: 600 [P.O.:600] Out: 3952 [Urine:3950; Stool:2] Intake/Output this shift: Total I/O In: 240 [P.O.:240] Out: 650 [Urine:650]  General appearance: alert, cooperative and mild distress Skin: Open areas are clean and pink, I see nothing requiring further surgical debridment at this point.  Lab Results:   Recent Labs  07/21/12 0600 07/22/12 0425  WBC 5.0 5.9  HGB 7.5* 8.2*  HCT 22.2* 25.3*  PLT 350 405*    BMET  Recent Labs  07/22/12 0425 07/23/12 0450  NA 137 136  K 4.5 4.5  CL 103 100  CO2 30 32  GLUCOSE 101* 98  BUN 20 19  CREATININE 1.32* 1.24*  CALCIUM 8.5 8.8   PT/INR No results found for this basename: LABPROT, INR,  in the last 72 hours  No results found for this basename: AST, ALT, ALKPHOS, BILITOT, PROT, ALBUMIN,  in the last 168 hours   Lipase  No results found for this basename: lipase     Studies/Results: No results found.  Medications: . amitriptyline  50 mg Oral QHS  . bd getting started take home kit  1 kit Other Once  . clindamycin (CLEOCIN) IV  600 mg Intravenous Q8H  . collagenase   Topical Daily  . diphenhydrAMINE  25 mg Intravenous Once  . feeding supplement  237 mL Oral Q24H  . feeding supplement  30 mL Oral BID  . FLUoxetine  40 mg Oral Daily  . furosemide  40 mg Intravenous Daily  . heparin subcutaneous  5,000 Units Subcutaneous Q8H  . HYDROcodone-acetaminophen  1-2 tablet Oral Q4H  . insulin aspart  0-20  Units Subcutaneous TID WC  . insulin aspart  0-5 Units Subcutaneous QHS  . insulin aspart  6 Units Subcutaneous TID WC  . insulin glargine  15 Units Subcutaneous QHS  . multivitamin with minerals  1 tablet Oral Daily  . sodium chloride  10-40 mL Intracatheter Q12H    Assessment/Plan Necrotizing fasciitis of the left mons pubis, left inguinal area, left perineum, left gluteal area, and left medial thigh S/p I&D of sites 07/16/12. DR. Wyatt, 07/14/12, Dr. Redmond Pulling, 07/09/12 & 07/07/12;  Dr.Douglas Ninfa Linden, 07/06/12 Dr. Lucia Gaskins. AODM poor control HBA1C 15.4 (07/06/12) Body mass index is 44.6   PLan:  Continue local wound care for now with hydro rx and daily packing. I don't know if there is a way to try a wound vac, but for now I would keep up hydro.   LOS: 17 days    [name],[name] 07/23/2012

## 2012-07-23 NOTE — Progress Notes (Signed)
Hypoglycemic Event  CBG: 61  Treatment: 15 GM carbohydrate snack  Symptoms: Nervous/irritable  Follow-up CBG: Time:1220 CBG Result:86  Possible Reasons for Event: Medication regimen: 6 units of meal coverage given  Comments/MD notified: Mc[place]re, states he will review medications and make adjustments    Gant, Rico Sheehan  Remember to initiate Hypoglycemia Order Set & complete

## 2012-07-23 NOTE — Progress Notes (Signed)
Patient interviewed and examined, agree with PA note above.  Edward Jolly MD, FACS  07/23/2012 7:27 PM

## 2012-07-24 LAB — BASIC METABOLIC PANEL
CO2: 33 mEq/L — ABNORMAL HIGH (ref 19–32)
Glucose, Bld: 83 mg/dL (ref 70–99)
Potassium: 4.5 mEq/L (ref 3.5–5.1)
Sodium: 137 mEq/L (ref 135–145)

## 2012-07-24 LAB — GLUCOSE, CAPILLARY
Glucose-Capillary: 80 mg/dL (ref 70–99)
Glucose-Capillary: 99 mg/dL (ref 70–99)
Glucose-Capillary: 76 mg/dL (ref 70–99)

## 2012-07-24 NOTE — Progress Notes (Signed)
Patient interviewed and examined, agree with PA note above.  Wound carefully examined. No further surgical debridement needed. It is not in a location that is amenable to a VAC dressing. Continue current care.  Edward Jolly MD, FACS  07/24/2012 10:36 AM

## 2012-07-24 NOTE — Progress Notes (Signed)
Physical Therapy Wound Treatment Patient Details  Name: [name] [name] MRN: [id number] Date of Birth: 24-Aug-1975  Today's Date: 07/24/2012 Time: [id number] Time Calculation (min): 80 min  Subjective  Subjective: Pt with no new complaints today, still reporting general itching from healing process Patient and Family Stated Goals: heal the wounds Date of Onset:  (prior to admission) Prior Treatments: Surgical I&D (multiple)    Wound Assessment    07/24/12 1100  Subjective Assessment  Subjective Pt with no new complaints today, still reporting general itching from healing process  Patient and Family Stated Goals heal the wounds  Date of Onset (prior to admission)  Prior Treatments Surgical I&D (multiple)  Evaluation and Treatment  Evaluation and Treatment Procedures Explained to Patient/Family Yes  Evaluation and Treatment Procedures agreed to  Wound 07/17/12 Perineum Medial Open abscess perineum and thigh   Date First Assessed/Time First Assessed: 07/17/12 1200   Location: Perineum  Location Orientation: Medial  Wound Description (Comments): Open abscess perineum and thigh   Present on Admission: Yes  Site / Wound Assessment Painful;Pink;Yellow;Red  % Wound base Red or Granulating 75%  % Wound base Yellow 20%  % Wound base Black 0%  % Wound base Other (Comment) 5% (subcutaneous fatty tissue, exposed muscle tissue)  Peri-wound Assessment Erythema (blanchable);Maceration;Pink  Margins Unattacted edges (unapproximated)  Closure None  Drainage Amount Moderate  Drainage Description Serosanguineous  Treatment Debridement (Selective);Hydrotherapy (Pulse lavage);Packing (Saline gauze);Tape changed  Dressing Type ABD;Gauze (Comment);Moist to dry (santyl)  Dressing Status Clean;Dry;Intact  Wound 07/17/12 Abdomen Left Necrotizing Faciitis  Date First Assessed: 07/17/12   Location: Abdomen  Location Orientation: Left  Wound Description (Comments): Necrotizing Faciitis  Site / Wound  Assessment Granulation tissue;Painful;Pale;Pink;Yellow  % Wound base Red or Granulating 75%  % Wound base Yellow 25%  % Wound base Black 0%  % Wound base Other (Comment) 0%  Peri-wound Assessment Erythema (blanchable)  Margins Unattacted edges (unapproximated)  Closure None  Drainage Amount Minimal  Drainage Description Serosanguineous  Treatment Debridement (Selective);Hydrotherapy (Pulse lavage);Packing (Saline gauze);Tape changed  Dressing Type ABD;Gauze (Comment);Moist to dry (santyl)  Dressing Status Clean;Dry;Intact  Hydrotherapy  Pulsed Lavage with Suction (psi) 4 psi  Pulsed Lavage with Suction - Normal Saline Used 2000 mL  Pulsed Lavage Tip Tip with splash shield  Pulsed lavage therapy - wound location perinieum; and thigh wounds  Selective Debridement  Selective Debridement - Location perineum and thigh wound  Selective Debridement - Tools Used Forceps;Scissors  Selective Debridement - Tissue Removed necrotic tissue and unviable slough  Wound Therapy - Assess/Plan/Recommendations  Wound Therapy - Clinical Statement Wound beds overall appear with large amounts of red beefy tissue covered in yellow soft slough. Able to debride small amounts of slough in wounds. Santyl ointment applied over base of all wounds and into tunneling portions. Wounds packed and dressed. Will continue hydrotherapy as indicated  Wound Therapy - Functional Problem List decreased mobility; increased pain, decreased skin itegrity  Factors Delaying/Impairing Wound Healing Altered sensation;Infection - systemic/local;Immobility;Vascular compromise  Hydrotherapy Plan Debridement;Dressing change;Patient/family education;Pulsatile lavage with suction  Wound Therapy - Frequency 6X / week  Wound Therapy - Follow Up Recommendations Home health RN;Other (comment) (vs SNF pending plan of care for wound (Vac vs hydrotherapy))  Wound Plan continue to treat as indicated  Wound Therapy Goals - Improve the function of  patient's integumentary system by progressing the wound(s) through the phases of wound healing by:  Decrease Necrotic Tissue to <10% all wounds  Decrease Necrotic Tissue - Progress Progressing toward goal  Increase Granulation Tissue to >90% all wounds  Increase Granulation Tissue - Progress Progressing toward goal  Improve Drainage Characteristics Min  Patient/Family will be able to  demontrate good physiological management contributions (eating well, participating in therapy; understanding of hygiene techniques)  Goals/treatment plan/discharge plan were made with and agreed upon by patient/family Yes  Time For Goal Achievement 7 days  Wound Therapy - Potential for Narcissa, PT DPT  [phone]

## 2012-07-24 NOTE — Discharge Summary (Signed)
Patient Name:  [name] [name] MRN: [id number]  PCP: Trish Fountain, MD DOB:  12/03/1975       Date of Admission:  07/06/2012  Date of Discharge:  07/26/2012     Attending Physician: Dr. Madilyn Fireman, MD         DISCHARGE DIAGNOSES: Necrotizing fascitis  ATN Acute Anemia Poorly controlled DM type 2  Depression  DISPOSITION AND FOLLOW-UP: [name] [name] is to follow-up with the listed providers as detailed below, at which time, the following should be addressed:   1. Continue to monitor healing of wound sites 2. Labs / imaging needed: CBC, BMET 3. Pending labs/ test needing follow-up: N/A       Discharge Orders   Future Orders Complete By Expires     Diet - low sodium heart healthy  As directed     Increase activity slowly  As directed         DISCHARGE MEDICATIONS:   Medication List    STOP taking these medications       lisinopril 5 MG tablet  Commonly known as:  PRINIVIL,ZESTRIL     metFORMIN 1000 MG tablet  Commonly known as:  GLUCOPHAGE      TAKE these medications       amitriptyline 50 MG tablet  Commonly known as:  ELAVIL  Take 1 tablet (50 mg total) by mouth at bedtime.     collagenase ointment  Commonly known as:  SANTYL  Apply topically daily.     FLUoxetine 40 MG capsule  Commonly known as:  PROZAC  Take 1 capsule (40 mg total) by mouth daily.     glipiZIDE 10 MG tablet  Commonly known as:  GLUCOTROL  Take 1 tablet (10 mg total) by mouth 2 (two) times daily before a meal.     HYDROcodone-acetaminophen 5-325 MG per tablet  Commonly known as:  NORCO/VICODIN  Take 1-2 tablets by mouth every 4 (four) hours.     insulin aspart 100 UNIT/ML injection  Commonly known as:  novoLOG  Inject 0-5 Units into the skin at bedtime.     insulin aspart 100 UNIT/ML injection  Commonly known as:  novoLOG  Inject 0-15 Units into the skin 3 (three) times daily with meals.     insulin aspart 100 UNIT/ML injection  Commonly known  as:  novoLOG  Inject 4 Units into the skin 3 (three) times daily with meals.     insulin glargine 100 UNIT/ML injection  Commonly known as:  LANTUS  Inject 15 Units into the skin at bedtime.     miconazole 2 % vaginal cream  Commonly known as:  MONISTAT 7  Place 1 applicator vaginally at bedtime.     pravastatin 20 MG tablet  Commonly known as:  PRAVACHOL  Take 1 tablet (20 mg total) by mouth daily.         CONSULTS:  PCCM, General surgery, WOC   PROCEDURES PERFORMED:  Dg Chest Port 1 View  07/06/2012  *RADIOLOGY REPORT*  Clinical Data: Preop for debridement of wound infection, cough and chills  PORTABLE CHEST - 1 VIEW  Comparison: Chest x-ray of 04/11/2011  Findings: No infiltrate or effusion is seen.  The heart is within normal limits in size.  There are degenerative changes throughout the thoracic spine.  IMPRESSION: No active lung disease.   Original Report Authenticated By: Ivar Drape, M.D.        ADMISSION DATA: H&P: [name] [name] is a 37 year old woman with  past medical history of diabetes hypertension depression and anxiety. She presented to [name] with a 2 to three-day history of swelling pain and drainage from her left groin. About 2 weeks ago she noticed a pimple on her left buttock she treated this with warm compresses and soaks. That lesion drained she thought everything was healing well and in about a week later she developed swelling and pain in her left groin. This continued to progress until it developed her left groin and labia and buttock and she developed dish water colored drainage. She was experiencing chills at home but no fevers, however, she did not check her temperature. She came to the ER when she continued to have pain and drainage. She also recently developed a dry cough. She was seen by surgery in the ER who felt that she needed to go to the OR for emergent cleanout. Postop she was transferred to the neurosurgery ICU for close monitoring overnight.     Physical Exam: General: Obese woman in some pain but no acute distress  Neuro: Alert and oriented, CNII-XII intact  HEENT: PERRL, EOMI, OP clear  Neck: Supple, no masses  Cardiovascular: Tachycardic, regular rhythm, no mrg  Lungs: CTAB, no wrr  Abdomen: Soft, NTND, no HSM  Musculoskeletal: Incision site dressed and intact  Skin: No rash, no lesions other than incision site  HOSPITAL COURSE: Necrotizing fascitis: admitted on 2/14 and taken emergently to OR for surgical debridement of site suspicious for nec fascitis over L groin. She was started on Vancomycin, zosyn, and clindamycin on admission. Repeat debridement of necrotic tissue on 2/15, 2/16, 2/17, 2/24. Patient was noted to have minimal blood loss associated with her debridement procedures. Wound cultures taken of her groin/buttocks wound revealed MRSA sensitive to clindamycin. Clindamycin was continued for a total of 20 days, and after discussing with general surgery, was discontinued on the day of discharge, which was POD #10 from her most recent debridement. After completing her final debridement she began regular wound care and hydrotherapy, which she continued until the time at which she was discharged to the [facility] facility. At time of hospital discharge she was stable and her wounds were healing appropriately.  Patient will need analgesia going forward and was prescribed norco at discharge, although she may need stronger opioids during her continued recovery while at [facility]. She will need continued wound care and hydrotherapy going forward while at the [facility] facility as her wounds are not amenable to wound vac at this time due to the large surface area they involve.   ATN: Patient's creatinine bumped from 1.01=>2.15 on hospital day #3, which was 2/2 ATN as evidenced by UA with waxy/granular casts (also with some hematuria although this was more likely 2/2 traumatic foley insertion). She recovered slowly throughout the remainder of her  hospital stay, and Cr slowly trended down to a nadir of 1.16 on the day of discharge. Lisinopril and metformin were held at discharge as she is still not quite at her baseline (~0.7).   Acute Anemia: Pt baseline Hgb 10. Her Hgb trended downwards after admission, likely 2/2 blood loss from surgery and hemodilution from IVF. She was transfused 3U PRBCs during her hospital course (1U on 2/20, 2U on 2/23). At the time of discharge, her Hb was low but stable (7.1 for two consecutive days).   Poorly controlled DM type 2: Hgb A1c 15.4 and outpt meds include metformin and glipizide. Pt initially presented in DKA on 2/14 with AG of 24 that resolved  after fluids. No ketones in urine. Pt had episodes of hypoglycemia into the 60s so Lantus decreased to 15U. She will be discharged on SSI in addition to basal and mealtime insulin for use while at the [facility] facility.  Depression: Stable on home prozac.    DISCHARGE DATA: Vital Signs: BP 156/88  Pulse 104  Temp(Src) 98.4 F (36.9 C) (Oral)  Resp 18  Ht 5\' 10"  (1.778 m)  Wt 313 lb 7.9 oz (142.2 kg)  BMI 44.98 kg/m2  SpO2 99%  LMP 07/06/2012  Labs: Results for orders placed during the hospital encounter of 07/06/12 (from the past 24 hour(s))  GLUCOSE, CAPILLARY     Status: Abnormal   Collection Time    07/25/12  4:49 PM      Result Value Range   Glucose-Capillary 114 (*) 70 - 99 mg/dL   Comment 1 Notify RN     Comment 2 Documented in Chart    GLUCOSE, CAPILLARY     Status: Abnormal   Collection Time    07/25/12  9:48 PM      Result Value Range   Glucose-Capillary 185 (*) 70 - 99 mg/dL  BASIC METABOLIC PANEL     Status: Abnormal   Collection Time    07/26/12  5:10 AM      Result Value Range   Sodium 137  135 - 145 mEq/L   Potassium 4.6  3.5 - 5.1 mEq/L   Chloride 103  96 - 112 mEq/L   CO2 30  19 - 32 mEq/L   Glucose, Bld 160 (*) 70 - 99 mg/dL   BUN 19  6 - 23 mg/dL   Creatinine, Ser 1.16 (*) 0.50 - 1.10 mg/dL   Calcium 8.6  8.4 - 10.5  mg/dL   GFR calc non Af Amer 60 (*) >90 mL/min   GFR calc Af Amer 69 (*) >90 mL/min  GLUCOSE, CAPILLARY     Status: Abnormal   Collection Time    07/26/12  7:33 AM      Result Value Range   Glucose-Capillary 135 (*) 70 - 99 mg/dL  CBC     Status: Abnormal   Collection Time    07/26/12 10:40 AM      Result Value Range   WBC 4.8  4.0 - 10.5 K/uL   RBC 2.66 (*) 3.87 - 5.11 MIL/uL   Hemoglobin 7.1 (*) 12.0 - 15.0 g/dL   HCT 21.3 (*) 36.0 - 46.0 %   MCV 80.1  78.0 - 100.0 fL   MCH 26.7  26.0 - 34.0 pg   MCHC 33.3  30.0 - 36.0 g/dL   RDW 13.9  11.5 - 15.5 %   Platelets 311  150 - 400 K/uL  GLUCOSE, CAPILLARY     Status: None   Collection Time    07/26/12 12:09 PM      Result Value Range   Glucose-Capillary 91  70 - 99 mg/dL     Time spent on discharge: 40 minutes  Services Ordered on Discharge: Y = Yes; Blank = No PT:   OT:   RN:   Equipment:   Other:     Signed: Gae Gallop, MD   PGY 1, Internal Medicine Resident 07/26/2012, 3:12 PM

## 2012-07-24 NOTE — Progress Notes (Signed)
8 Days Post-Op  Subjective: Tolerating dressing change with IV dilaudid.  Objective: Vital signs in last 24 hours: Temp:  [97.8 F (36.6 C)-98.6 F (37 C)] 97.9 F (36.6 C) (03/04 0508) Pulse Rate:  [84-105] 84 (03/04 0508) Resp:  [18-20] 18 (03/04 0508) BP: (118-137)/(70-80) 131/80 mmHg (03/04 0508) SpO2:  [97 %-100 %] 100 % (03/04 0508) Weight:  [311 lb 4.6 oz (141.199 kg)] 311 lb 4.6 oz (141.199 kg) (03/03 2037) Last BM Date: 07/23/12  Intake/Output from previous day: 03/03 0701 - 03/04 0700 In: 1000 [P.O.:900; IV Piggyback:100] Out: 5525 [Urine:5525] Intake/Output this shift: Total I/O In: 60 [P.O.:60] Out: -   General appearance: alert, cooperative and no distress Skin: The open areas left leg are looking good, there is some soupy like material in parts of it, but over all it looks fine.  She has a good deal of pain with dressing change and hydro rx.  Lab Results:   Recent Labs  07/22/12 0425  WBC 5.9  HGB 8.2*  HCT 25.3*  PLT 405*    BMET  Recent Labs  07/23/12 0450 07/24/12 0500  NA 136 137  K 4.5 4.5  CL 100 100  CO2 32 33*  GLUCOSE 98 83  BUN 19 19  CREATININE 1.24* 1.25*  CALCIUM 8.8 8.7   PT/INR No results found for this basename: LABPROT, INR,  in the last 72 hours  No results found for this basename: AST, ALT, ALKPHOS, BILITOT, PROT, ALBUMIN,  in the last 168 hours   Lipase  No results found for this basename: lipase     Studies/Results: No results found.  Medications: . amitriptyline  50 mg Oral QHS  . bd getting started take home kit  1 kit Other Once  . clindamycin (CLEOCIN) IV  600 mg Intravenous Q8H  . collagenase   Topical Daily  . diphenhydrAMINE  25 mg Intravenous Once  . feeding supplement  237 mL Oral Q24H  . feeding supplement  30 mL Oral BID  . FLUoxetine  40 mg Oral Daily  . heparin subcutaneous  5,000 Units Subcutaneous Q8H  . HYDROcodone-acetaminophen  1-2 tablet Oral Q4H  . insulin aspart  0-15 Units  Subcutaneous TID WC  . insulin aspart  0-5 Units Subcutaneous QHS  . insulin aspart  4 Units Subcutaneous TID WC  . insulin glargine  15 Units Subcutaneous QHS  . multivitamin with minerals  1 tablet Oral Daily  . sodium chloride  10-40 mL Intracatheter Q12H    Assessment/Plan Necrotizing fasciitis of the left mons pubis, left inguinal area, left perineum, left gluteal area, and left medial thigh  S/p I&D of sites 07/16/12. DR. Wyatt, 07/14/12, Dr. Redmond Pulling, 07/09/12 & 07/07/12; Dr.Douglas Ninfa Linden, 07/06/12 Dr. Lucia Gaskins.  AODM poor control HBA1C 15.4 (07/06/12) Body mass index is 44.6   Plan:  Continue local wound care for now with hydro rx and daily packing. Dr. Excell Seltzer has examined the wound and it is his opinion there is no way to have a functional wound vac system cover this wound.        LOS: 18 days    JENNINGS,WILLARD 07/24/2012

## 2012-07-24 NOTE — Progress Notes (Signed)
Subjective:    Patient states her gluteal pain is improved. No complaints of bladder spasms this morning, but states she had some slight difficulty with urination yesterday.   Interval Events: No acute events.    Objective:    Vital Signs:   Temp:  [97.8 F (36.6 C)-98.6 F (37 C)] 97.9 F (36.6 C) (03/04 0508) Pulse Rate:  [84-105] 84 (03/04 0508) Resp:  [18-20] 18 (03/04 0508) BP: (118-137)/(70-80) 131/80 mmHg (03/04 0508) SpO2:  [97 %-100 %] 100 % (03/04 0508) Weight:  [311 lb 4.6 oz (141.199 kg)] 311 lb 4.6 oz (141.199 kg) (03/03 2037) Last BM Date: 07/23/12  24-hour weight change: Weight change: -0 oz (-0.001 kg)  Intake/Output:   Intake/Output Summary (Last 24 hours) at 07/24/12 1050 Last data filed at 07/24/12 0846  Gross per 24 hour  Intake    820 ml  Output   4875 ml  Net  -4055 ml      Physical Exam: General: Vital signs reviewed and noted. Well-developed, well-nourished, in no acute distress; alert, appropriate and cooperative throughout examination.  Lungs: Normal respiratory effort. Clear to auscultation BL without crackles or wheezes.  Heart: RRR. S1 and S2 normal without gallop, murmur, or rubs.  Abdomen: BS normoactive. Soft, Nondistended, non-tender. No masses or organomegaly.  Extremities/buttocks/groin: No pretibial edema. Buttock/groin dressing c/d/i.    Labs:  Basic Metabolic Panel:  Recent Labs Lab 07/20/12 0605 07/21/12 0600 07/22/12 0425 07/23/12 0450 07/24/12 0500  NA 139 138 137 136 137  K 4.1 4.2 4.5 4.5 4.5  CL 107 105 103 100 100  CO2 26 27 30  32 33*  GLUCOSE 95 127* 101* 98 83  BUN 21 20 20 19 19   CREATININE 1.46* 1.40* 1.32* 1.24* 1.25*  CALCIUM 8.3* 8.1* 8.5 8.8 8.7   CBC:  Recent Labs Lab 07/18/12 0433 07/19/12 0500 07/20/12 0605 07/21/12 0600 07/22/12 0425  WBC 8.8 7.8 5.9 5.0 5.9  HGB 7.9* 7.8* 7.6* 7.5* 8.2*  HCT 23.0* 23.1* 23.0* 22.2* 25.3*  MCV 79.0 78.8 79.9 79.9 80.1  PLT 321 368 341 350 405*     Cardiac Enzymes:  Recent Labs Lab 07/23/12 0450  CKTOTAL 49    CBG:  Recent Labs Lab 07/23/12 1133 07/23/12 1220 07/23/12 1700 07/23/12 2043 07/24/12 0754  GLUCAP 61* 86 167* 145* 40    Microbiology: Results for orders placed during the hospital encounter of 07/06/12  CULTURE, BLOOD (ROUTINE X 2)     Status: None   Collection Time    07/06/12 10:50 AM      Result Value Range Status   Specimen Description BLOOD LEFT ANTECUBITAL   Final   Special Requests BOTTLES DRAWN AEROBIC ONLY 10CC   Final   Culture  Setup Time 07/06/2012 18:19   Final   Culture NO GROWTH 5 DAYS   Final   Report Status 07/12/2012 FINAL   Final  CULTURE, BLOOD (ROUTINE X 2)     Status: None   Collection Time    07/06/12 11:00 AM      Result Value Range Status   Specimen Description BLOOD RIGHT ANTECUBITAL   Final   Special Requests BOTTLES DRAWN AEROBIC ONLY 2CC   Final   Culture  Setup Time 07/06/2012 18:19   Final   Culture NO GROWTH 5 DAYS   Final   Report Status 07/12/2012 FINAL   Final  WOUND CULTURE     Status: None   Collection Time    07/06/12  3:11 PM  Result Value Range Status   Specimen Description WOUND THIGH LEFT   Final   Special Requests PT ON ZOSYN,VANCOMYCIN AND CLEOCIN   Final   Gram Stain     Final   Value: RARE WBC PRESENT, PREDOMINANTLY PMN     NO SQUAMOUS EPITHELIAL CELLS SEEN     ABUNDANT GRAM POSITIVE RODS     FEW GRAM POSITIVE COCCI     IN PAIRS IN CLUSTERS   Culture     Final   Value: RARE METHICILLIN RESISTANT STAPHYLOCOCCUS AUREUS     Note: RIFAMPIN AND GENTAMICIN SHOULD NOT BE USED AS SINGLE DRUGS FOR TREATMENT OF STAPH INFECTIONS. This organism DOES NOT demonstrate inducible Clindamycin resistance in vitro. CRITICAL RESULT CALLED TO, READ BACK BY AND VERIFIED WITH: ROBIN WOFFORD BY      INGRAM A 2/18 8AM   Report Status 07/10/2012 FINAL   Final   Organism ID, Bacteria METHICILLIN RESISTANT STAPHYLOCOCCUS AUREUS   Final  ANAEROBIC CULTURE     Status:  None   Collection Time    07/06/12  3:11 PM      Result Value Range Status   Specimen Description WOUND THIGH LEFT   Final   Special Requests PT ON ZOSYN,VANCOMYCIN AND CLEOCIN   Final   Gram Stain     Final   Value: RARE WBC PRESENT, PREDOMINANTLY PMN     NO SQUAMOUS EPITHELIAL CELLS SEEN     ABUNDANT GRAM POSITIVE RODS     FEW GRAM POSITIVE COCCI IN PAIRS     IN CLUSTERS   Culture NO ANAEROBES ISOLATED   Final   Report Status 07/11/2012 FINAL   Final  MRSA PCR SCREENING     Status: Abnormal   Collection Time    07/06/12  8:12 PM      Result Value Range Status   MRSA by PCR POSITIVE (*) NEGATIVE Final   Comment:            The GeneXpert MRSA Assay (FDA     approved for NASAL specimens     only), is one component of a     comprehensive MRSA colonization     surveillance program. It is not     intended to diagnose MRSA     infection nor to guide or     monitor treatment for     MRSA infections.     RESULT CALLED TO, READ BACK BY AND VERIFIED WITH:     S COGGINS RN 2.14.14 AT 2215 BY ROMEROJ     Medications:    Infusions:    Scheduled Medications: . amitriptyline  50 mg Oral QHS  . bd getting started take home kit  1 kit Other Once  . clindamycin (CLEOCIN) IV  600 mg Intravenous Q8H  . collagenase   Topical Daily  . diphenhydrAMINE  25 mg Intravenous Once  . feeding supplement  237 mL Oral Q24H  . feeding supplement  30 mL Oral BID  . FLUoxetine  40 mg Oral Daily  . heparin subcutaneous  5,000 Units Subcutaneous Q8H  . HYDROcodone-acetaminophen  1-2 tablet Oral Q4H  . insulin aspart  0-15 Units Subcutaneous TID WC  . insulin aspart  0-5 Units Subcutaneous QHS  . insulin aspart  4 Units Subcutaneous TID WC  . insulin glargine  15 Units Subcutaneous QHS  . multivitamin with minerals  1 tablet Oral Daily  . sodium chloride  10-40 mL Intracatheter Q12H    PRN Medications: diphenhydrAMINE, HYDROmorphone (DILAUDID) injection,  ondansetron (ZOFRAN) IV, sodium  chloride   Assessment/ Plan:   Patient is a 37 yo F with history of uncontrolled DM, HTN, HLD, anxiety and depression who presented on 07/06/12 with swelling/pain/drainage from her left groin and taken emergently to OR for surgical debridement. S/p I/D and multiple debridements this admission.   Necrotizing fascitis: admitted on 2/14 and taken emergently to OR for surgical debridement of site suspicious for nec fasc. Over L groin. Pt was then immediately started on Vanc/zosyn/clinda. Repeat debridement of necrotic tissue on 2/15, 2/16, 2/17, 2/24. Minimal blood loss. Wound culture --> MRSA sensitive to clinda.  -continue Clindamycin (day 18 - optimal duration of abx therapy not defined: will discuss with surgery) -continue hydrotherapy  -Pain mgmt with Norco 5-325, 1-2 tab q4h prn, dilaudid 1-2mg  q4h prn (esp prior to hydrotherapy)  -surgery following--wound vac not an option for this wound given its extensiveness  ATN: resolving; Pt Cr 1.24 => 1.25 (downtrending and responding to fluids) today from presumed baseline of 0.7 on admission. Previous UA shows waxy/granular casts and uric acid crystals along with hematuria (RBCs 21-50). Some of hematuria maybe 2/2 traumatic foley insertion.  -continue lasix 40mg  po qd  -continue foley with irrigations    Acute Anemia: Pt baseline Hgb 10 and 8.2 and improving as of 3/2. Review of intraoperative notes doesn't appear to be etiology of bleeding. No active sites of bleeding. Likely related to critical illness--iron panel most consistent with AOCD.  -monitor CBC  -transfuse <7.0   Poorly controlled DM type 2: Hgb A1c 15.4 and outpt meds include metformin and glipizide. Pt initially presented in DKA on 2/14 with AG of 24 that resolved after fluids. No ketones in urine. Pt had episodes of hypoglycemia into the 60s so Lantus decreased to 15U.  -cont SSI with lantus and Novolog 6u TIDWC  -carb modified diet  -will need to d/c with Novolog 70/30 d/t cost    Depression: Stable, no SI/HI  -continue home prozac  DVT PPX - heparin  CODE STATUS - full CONSULTS PLACED - Gen surgery DISPO - Disposition is deferred at this time, awaiting improvement of current medical problems. Patient will need SNF/LTAC placement at discharge. Anticipated discharge in approximately 2-3 day(s).  The patient does have a current [facility] PCP (PRIBULA,CHRISTOPHER, MD) and does need an [facility] hospital follow-up appointment after discharge.  Is the [facility] hospital follow-up appointment a one-time only appointment? not applicable.  Does the patient have transportation limitations that hinder transportation to clinic appointments? no SERVICE NEEDED AT [place] Y = Yes, Blank = No  PT:    OT:    RN:    Equipment:    Other:        Length of Stay: 18 day(s)   Signed: Gae Gallop, MD  PGY-1, Internal Medicine Resident Pager: [phone] (7AM-5PM) 07/24/2012, 10:50 AM

## 2012-07-25 LAB — CBC
HCT: 21.7 % — ABNORMAL LOW (ref 36.0–46.0)
Hemoglobin: 7.1 g/dL — ABNORMAL LOW (ref 12.0–15.0)
MCH: 25.8 pg — ABNORMAL LOW (ref 26.0–34.0)
MCHC: 32.7 g/dL (ref 30.0–36.0)
MCV: 78.9 fL (ref 78.0–100.0)
Platelets: 312 10*3/uL (ref 150–400)
RBC: 2.75 MIL/uL — ABNORMAL LOW (ref 3.87–5.11)
RDW: 13.9 % (ref 11.5–15.5)
WBC: 5.2 10*3/uL (ref 4.0–10.5)

## 2012-07-25 LAB — GLUCOSE, CAPILLARY
Glucose-Capillary: 121 mg/dL — ABNORMAL HIGH (ref 70–99)
Glucose-Capillary: 114 mg/dL — ABNORMAL HIGH (ref 70–99)
Glucose-Capillary: 185 mg/dL — ABNORMAL HIGH (ref 70–99)

## 2012-07-25 LAB — BASIC METABOLIC PANEL
Calcium: 8.6 mg/dL (ref 8.4–10.5)
Creatinine, Ser: 1.22 mg/dL — ABNORMAL HIGH (ref 0.50–1.10)
GFR calc Af Amer: 65 mL/min — ABNORMAL LOW (ref >90)

## 2012-07-25 NOTE — Progress Notes (Signed)
Subjective:    No new complaints this AM. States she still has mild/moderate buttocks pain.   Interval Events: No acute events.    Objective:    Vital Signs:   Temp:  [97.4 F (36.3 C)-98.3 F (36.8 C)] 97.4 F (36.3 C) (03/05 0840) Pulse Rate:  [86-99] 88 (03/05 0840) Resp:  [18] 18 (03/05 0840) BP: (120-136)/(75-88) 120/75 mmHg (03/05 0840) SpO2:  [98 %-100 %] 98 % (03/05 0840) Weight:  [311 lb 4.6 oz (141.199 kg)] 311 lb 4.6 oz (141.199 kg) (03/04 2040) Last BM Date: 07/25/12  24-hour weight change: Weight change: 0 lb (0 kg)  Intake/Output:   Intake/Output Summary (Last 24 hours) at 07/25/12 1632 Last data filed at 07/25/12 0850  Gross per 24 hour  Intake   1300 ml  Output   2451 ml  Net  -1151 ml      Physical Exam: General: Vital signs reviewed and noted. Well-developed, well-nourished, in no acute distress; alert, appropriate and cooperative throughout examination.  Lungs: Normal respiratory effort. Clear to auscultation BL without crackles or wheezes.  Heart: RRR. S1 and S2 normal without gallop, murmur, or rubs.  Abdomen: BS normoactive. Soft, Nondistended, non-tender. No masses or organomegaly.  Extremities/buttocks/groin: No pretibial edema. Buttock/groin dressing c/d/i.    Labs:  Basic Metabolic Panel:  Recent Labs Lab 07/21/12 0600 07/22/12 0425 07/23/12 0450 07/24/12 0500 07/25/12 0500  NA 138 137 136 137 136  K 4.2 4.5 4.5 4.5 4.4  CL 105 103 100 100 101  CO2 27 30 32 33* 31  GLUCOSE 127* 101* 98 83 126*  BUN 20 20 19 19 19   CREATININE 1.40* 1.32* 1.24* 1.25* 1.22*  CALCIUM 8.1* 8.5 8.8 8.7 8.6    CBC:  Recent Labs Lab 07/19/12 0500 07/20/12 0605 07/21/12 0600 07/22/12 0425 07/25/12 0500  WBC 7.8 5.9 5.0 5.9 5.2  HGB 7.8* 7.6* 7.5* 8.2* 7.1*  HCT 23.1* 23.0* 22.2* 25.3* 21.7*  MCV 78.8 79.9 79.9 80.1 78.9  PLT 368 341 350 405* 312    Cardiac Enzymes:  Recent Labs Lab 07/23/12 0450  CKTOTAL 49    CBG:  Recent  Labs Lab 07/24/12 1135 07/24/12 1714 07/24/12 2044 07/25/12 0801 07/25/12 1138  GLUCAP 99 81 76 121* 101*    Microbiology: Results for orders placed during the hospital encounter of 07/06/12  CULTURE, BLOOD (ROUTINE X 2)     Status: None   Collection Time    07/06/12 10:50 AM      Result Value Range Status   Specimen Description BLOOD LEFT ANTECUBITAL   Final   Special Requests BOTTLES DRAWN AEROBIC ONLY 10CC   Final   Culture  Setup Time 07/06/2012 18:19   Final   Culture NO GROWTH 5 DAYS   Final   Report Status 07/12/2012 FINAL   Final  CULTURE, BLOOD (ROUTINE X 2)     Status: None   Collection Time    07/06/12 11:00 AM      Result Value Range Status   Specimen Description BLOOD RIGHT ANTECUBITAL   Final   Special Requests BOTTLES DRAWN AEROBIC ONLY 2CC   Final   Culture  Setup Time 07/06/2012 18:19   Final   Culture NO GROWTH 5 DAYS   Final   Report Status 07/12/2012 FINAL   Final  WOUND CULTURE     Status: None   Collection Time    07/06/12  3:11 PM      Result Value Range Status   Specimen  Description WOUND THIGH LEFT   Final   Special Requests PT ON ZOSYN,VANCOMYCIN AND CLEOCIN   Final   Gram Stain     Final   Value: RARE WBC PRESENT, PREDOMINANTLY PMN     NO SQUAMOUS EPITHELIAL CELLS SEEN     ABUNDANT GRAM POSITIVE RODS     FEW GRAM POSITIVE COCCI     IN PAIRS IN CLUSTERS   Culture     Final   Value: RARE METHICILLIN RESISTANT STAPHYLOCOCCUS AUREUS     Note: RIFAMPIN AND GENTAMICIN SHOULD NOT BE USED AS SINGLE DRUGS FOR TREATMENT OF STAPH INFECTIONS. This organism DOES NOT demonstrate inducible Clindamycin resistance in vitro. CRITICAL RESULT CALLED TO, READ BACK BY AND VERIFIED WITH: ROBIN WOFFORD BY      INGRAM A 2/18 8AM   Report Status 07/10/2012 FINAL   Final   Organism ID, Bacteria METHICILLIN RESISTANT STAPHYLOCOCCUS AUREUS   Final  ANAEROBIC CULTURE     Status: None   Collection Time    07/06/12  3:11 PM      Result Value Range Status   Specimen  Description WOUND THIGH LEFT   Final   Special Requests PT ON ZOSYN,VANCOMYCIN AND CLEOCIN   Final   Gram Stain     Final   Value: RARE WBC PRESENT, PREDOMINANTLY PMN     NO SQUAMOUS EPITHELIAL CELLS SEEN     ABUNDANT GRAM POSITIVE RODS     FEW GRAM POSITIVE COCCI IN PAIRS     IN CLUSTERS   Culture NO ANAEROBES ISOLATED   Final   Report Status 07/11/2012 FINAL   Final  MRSA PCR SCREENING     Status: Abnormal   Collection Time    07/06/12  8:12 PM      Result Value Range Status   MRSA by PCR POSITIVE (*) NEGATIVE Final   Comment:            The GeneXpert MRSA Assay (FDA     approved for NASAL specimens     only), is one component of a     comprehensive MRSA colonization     surveillance program. It is not     intended to diagnose MRSA     infection nor to guide or     monitor treatment for     MRSA infections.     RESULT CALLED TO, READ BACK BY AND VERIFIED WITH:     S COGGINS RN 2.14.14 AT 2215 BY ROMEROJ      Medications:    Infusions:    Scheduled Medications: . amitriptyline  50 mg Oral QHS  . bd getting started take home kit  1 kit Other Once  . clindamycin (CLEOCIN) IV  600 mg Intravenous Q8H  . collagenase   Topical Daily  . diphenhydrAMINE  25 mg Intravenous Once  . feeding supplement  237 mL Oral Q24H  . feeding supplement  30 mL Oral BID  . FLUoxetine  40 mg Oral Daily  . heparin subcutaneous  5,000 Units Subcutaneous Q8H  . HYDROcodone-acetaminophen  1-2 tablet Oral Q4H  . insulin aspart  0-15 Units Subcutaneous TID WC  . insulin aspart  0-5 Units Subcutaneous QHS  . insulin aspart  4 Units Subcutaneous TID WC  . insulin glargine  15 Units Subcutaneous QHS  . multivitamin with minerals  1 tablet Oral Daily  . sodium chloride  10-40 mL Intracatheter Q12H    PRN Medications: diphenhydrAMINE, HYDROmorphone (DILAUDID) injection, ondansetron (ZOFRAN) IV, sodium chloride  Assessment/ Plan:   Patient is a 37 yo F with history of uncontrolled DM, HTN,  HLD, anxiety and depression who presented on 07/06/12 with swelling/pain/drainage from her left groin and taken emergently to OR for surgical debridement. S/p I/D and multiple debridements this admission.   Necrotizing fascitis: admitted on 2/14 and taken emergently to OR for surgical debridement of site suspicious for nec fasc. Over L groin. Pt was then immediately started on Vanc/zosyn/clinda. Repeat debridement of necrotic tissue on 2/15, 2/16, 2/17, 2/24. Minimal blood loss. Wound culture --> MRSA sensitive to clinda.  -continue Clindamycin (day 19 - optimal duration of abx therapy not defined: will discuss with surgery)  -continue hydrotherapy  -Pain mgmt with Norco 5-325, 1-2 tab q4h prn, dilaudid 1-2mg  q4h prn (esp prior to hydrotherapy)  -surgery following--wound vac not an option for this wound given its extensiveness  -social work consult (made on 3/3: pt states she is still waiting to be seen by social work to discuss LTAC/SNF options for discharge. At this point pt is medically stable and this is the only issue holding up her discharge).   Acute Anemia: Hb decreased this AM to 7.1. -cont to monitor CBC  -transfuse <7.0   Poorly controlled DM type 2: Hgb A1c 15.4 and outpt meds include metformin and glipizide. Pt initially presented in DKA on 2/14 with AG of 24 that resolved after fluids. No ketones in urine. Pt had episodes of hypoglycemia into the 60s so Lantus decreased to 15U.  -cont SSI with lantus and Novolog 4u TIDWC  -carb modified diet  -will need to d/c with Novolog 70/30 d/t cost   Depression: Stable, no SI/HI  -continue home prozac  DVT PPX - heparin  CODE STATUS - full  CONSULTS PLACED - Gen surgery  DISPO - Disposition is deferred at this time, awaiting assistance from [facility] for placement in LTAC or SNF, per above (CSW consulted for this issue on 3/3 (with repeat consult order and telephone call to [facility] made on 3/4), and this issue is currently what is keeping patient  hospitalized as her med issues are stable). Anticipated discharge in approximately 1-2 day(s).  The patient does have a current [facility] PCP (PRIBULA,CHRISTOPHER, MD) and does need an [facility] hospital follow-up appointment after discharge.  Is the [facility] hospital follow-up appointment a one-time only appointment? not applicable.  Does the patient have transportation limitations that hinder transportation to clinic appointments? no  SERVICE NEEDED AT [facility] = Yes, Blank = No  PT:    OT:    RN:    Equipment:    Other:         Length of Stay: 19 day(s)   Signed: Gae Gallop, MD  PGY-1, Internal Medicine Resident Pager: [phone] (7AM-5PM) 07/25/2012, 4:32 PM

## 2012-07-25 NOTE — Progress Notes (Signed)
hgb  7.1  Results placed to on call  MD.

## 2012-07-25 NOTE — Progress Notes (Signed)
NUTRITION FOLLOW UP  DOCUMENTATION CODES  Per approved criteria   -Morbid Obesity    Intervention:   1. Continue current supplement regimen. 2. RD to continue to follow nutrition care plan  Nutrition Dx:   Increased nutrient needs related to open thigh abscess as evidenced by estimated nutrition needs. Ongoing.  Goal:   Intake to meet >90% of estimated nutrition needs to promote wound healing. Met.  Monitor:   PO intake, wound healing, labs, weight trend, supplement tolerance  Assessment:   Patient has had multiple debridements of left thigh wound/abscess in the OR. Receiving hydrotherapy to open thigh wound/abscess. Surgery following was considering wound vac, however surgery notes that wound vac is not an option for this wound given its extensiveness.  Intake of Carbohydrate Modified Medium diet remains stable, consuming 75 - 100% of meals. Continues with order for Ensure Complete po daily and Prostat po BID. Nurse tech reports that pt is adequately consuming meals and taking supplements at this time.  Height: Ht Readings from Last 1 Encounters:  07/24/12 5\' 10"  (1.778 m)    Weight Status:   Wt Readings from Last 1 Encounters:  07/24/12 311 lb 4.6 oz (141.199 kg)  Wt trending back down to admit weight of 297 lb.  Body mass index is 44.67 kg/(m^2). Obese class III.  Re-estimated needs:  Kcal: 2350 Protein: 145 grams Fluid: 2.4 liters  Skin: large open abscess on left thigh with multiple areas of tunneling  Diet Order: Carb Control Medium, 1600 - 2000   Intake/Output Summary (Last 24 hours) at 07/25/12 0921 Last data filed at 07/25/12 0559  Gross per 24 hour  Intake   1080 ml  Output   3350 ml  Net  -2270 ml    Last BM: 3/4   Labs:   Recent Labs Lab 07/23/12 0450 07/24/12 0500 07/25/12 0500  NA 136 137 136  K 4.5 4.5 4.4  CL 100 100 101  CO2 32 33* 31  BUN 19 19 19   CREATININE 1.24* 1.25* 1.22*  CALCIUM 8.8 8.7 8.6  GLUCOSE 98 83 126*    CBG  (last 3)   Recent Labs  07/24/12 1714 07/24/12 2044 07/25/12 0801  GLUCAP 81 76 121*    Scheduled Meds: . amitriptyline  50 mg Oral QHS  . bd getting started take home kit  1 kit Other Once  . clindamycin (CLEOCIN) IV  600 mg Intravenous Q8H  . collagenase   Topical Daily  . diphenhydrAMINE  25 mg Intravenous Once  . feeding supplement  237 mL Oral Q24H  . feeding supplement  30 mL Oral BID  . FLUoxetine  40 mg Oral Daily  . heparin subcutaneous  5,000 Units Subcutaneous Q8H  . HYDROcodone-acetaminophen  1-2 tablet Oral Q4H  . insulin aspart  0-15 Units Subcutaneous TID WC  . insulin aspart  0-5 Units Subcutaneous QHS  . insulin aspart  4 Units Subcutaneous TID WC  . insulin glargine  15 Units Subcutaneous QHS  . multivitamin with minerals  1 tablet Oral Daily  . sodium chloride  10-40 mL Intracatheter Q12H    Continuous Infusions:  none  Inda Coke MS, RD, LDN Pager: [phone] After-hours pager: [phone]

## 2012-07-25 NOTE — Progress Notes (Addendum)
Physical Therapy Wound Treatment Patient Details  Name: [name] [name] MRN: [id number] Date of Birth: [birth year]  Today's Date: 07/25/2012 Time: [id number] Time Calculation (min): 75 min  Subjective  Subjective: Pt with no new complaints today, still reporting general itching from healing process Patient and Family Stated Goals: heal the wounds  Pain Score: Pain Score:   1  Wound Assessment  Wound beds present with large base of granulation tissue as well as yellow soft slough overlay; no significant changes in majority of wound bed.  Area of concern does exist on posterior side of wound bed (seen with patient position supine with Left leg elevated onto shoulder of technician); unseen prior to today secondary to patient positioning.  This area is filled with hardened necrotic tissue and appears to range to unknown depths. Attempts to debride area did not seem to help secondary to inability to position patient in an acessible manner. Will continue to monitor and consult with surgical team next therapy session. Patient informed of area of concern and advised that we will continue with application of Santyl to facilitate further debridement, Pt appreciative for the update. Will continue to perform hydrotherapy as indicated.   07/25/12 1300  Subjective Assessment  Subjective Pt with no new complaints today, still reporting general itching from healing process  Patient and Family Stated Goals heal the wounds  Evaluation and Treatment  Evaluation and Treatment Procedures Explained to Patient/Family Yes  Evaluation and Treatment Procedures agreed to  Wound 07/17/12 Perineum Medial Open abscess perineum and thigh   Date First Assessed/Time First Assessed: 07/17/12 1200   Location: Perineum  Location Orientation: Medial  Wound Description (Comments): Open abscess perineum and thigh   Present on Admission: Yes  Site / Wound Assessment Painful;Pink;Yellow;Red  % Wound base Red or Granulating 75%   % Wound base Yellow 20%  % Wound base Black 0%  % Wound base Other (Comment) 5% (subcutaneous fatty tissue, exposed muscle tissue)  Peri-wound Assessment Erythema (blanchable);Maceration;Pink  Margins Unattacted edges (unapproximated)  Closure None  Drainage Amount Moderate  Drainage Description Serosanguineous  Treatment Debridement (Selective);Hydrotherapy (Pulse lavage);Packing (Saline gauze);Tape changed  Dressing Type ABD;Gauze (Comment);Moist to dry (santyl)  Dressing Changed Changed  Dressing Status Clean;Dry;Intact  Wound 07/17/12 Abdomen Left Necrotizing Faciitis  Date First Assessed: 07/17/12   Location: Abdomen  Location Orientation: Left  Wound Description (Comments): Necrotizing Faciitis  Site / Wound Assessment Granulation tissue;Painful;Pale;Pink;Yellow  % Wound base Red or Granulating 75%  % Wound base Yellow 25%  % Wound base Black 0%  % Wound base Other (Comment) 0%  Peri-wound Assessment Erythema (blanchable)  Margins Unattacted edges (unapproximated)  Closure None  Drainage Amount Minimal  Drainage Description Serosanguineous  Treatment Debridement (Selective);Hydrotherapy (Pulse lavage);Packing (Saline gauze);Tape changed  Dressing Type ABD;Gauze (Comment);Moist to dry (santyl)  Dressing Changed Changed  Dressing Status Clean;Dry;Intact  Hydrotherapy  Pulsed Lavage with Suction (psi) 4 psi  Pulsed Lavage with Suction - Normal Saline Used 2000 mL  Pulsed Lavage Tip Tip with splash shield  Pulsed lavage therapy - wound location perinieum; and thigh wounds  Selective Debridement  Selective Debridement - Location perineum and thigh wound  Selective Debridement - Tools Used Forceps;Scissors  Selective Debridement - Tissue Removed slough  Wound Therapy - Assess/Plan/Recommendations  Wound Therapy - Clinical Statement Wound beds present with large base of granulation tissue as well as yellow soft slough overlay; no significant changes in majority of wound bed.   Area of concern does exist on posterior side of wound bed;  unseen prior to today secondary to patient positioning.  This area is filled with hardened necrotic tissue and appears to range to unknown depths. Attempts to debride area did not seem to help secondary to inability to position patient in an acessible manner. Will continue to monitor and consult with surgical team next therapy session. Patient informed of area of concern and advised that we will continue with application of Santyl to facilitate further debridement, Pt appreciative for the update. Will continue to perform hydrotherapy as indicated.  Wound Therapy - Functional Problem List decreased mobility; increased pain, decreased skin itegrity  Factors Delaying/Impairing Wound Healing Altered sensation;Infection - systemic/local;Immobility;Vascular compromise  Hydrotherapy Plan Debridement;Dressing change;Patient/family education;Pulsatile lavage with suction  Wound Therapy - Frequency 6X / week  Wound Therapy - Follow Up Recommendations Home health RN;Other (comment) (vs SNF pending plan of care for wound (Vac vs hydrotherapy))  Wound Plan continue to treat as indicated  Wound Therapy Goals - Improve the function of patient's integumentary system by progressing the wound(s) through the phases of wound healing by:  Decrease Necrotic Tissue to <10% all wounds  Decrease Necrotic Tissue - Progress Progressing toward goal  Increase Granulation Tissue to >90% all wounds  Increase Granulation Tissue - Progress Progressing toward goal  Improve Drainage Characteristics Min  Improve Drainage Characteristics - Progress Not progressing  Patient/Family will be able to  demontrate good physiological management contributions (eating well, participating in therapy; understanding of hygiene techniques)  Patient/Family Instruction Goal - Progress Met  Goals/treatment plan/discharge plan were made with and agreed upon by patient/family Yes  Time For Goal  Achievement 7 days  Wound Therapy - Potential for Goals Good    Alben Deeds, PT DPT  [phone]

## 2012-07-26 ENCOUNTER — Institutional Professional Consult (permissible substitution): Admit: 2012-07-26 | Payer: Self-pay | Admitting: Internal Medicine

## 2012-07-26 ENCOUNTER — Inpatient Hospital Stay
Admission: AD | Admit: 2012-07-26 | Discharge: 2012-09-14 | Disposition: A | Discharge status: Home Health | Payer: Self-pay | Source: Ambulatory Visit | Attending: Internal Medicine | Admitting: Internal Medicine

## 2012-07-26 DIAGNOSIS — D649 Anemia, unspecified: Secondary | ICD-10-CM

## 2012-07-26 DIAGNOSIS — E1110 Type 2 diabetes mellitus with ketoacidosis without coma: Secondary | ICD-10-CM

## 2012-07-26 DIAGNOSIS — M726 Necrotizing fasciitis: Secondary | ICD-10-CM

## 2012-07-26 DIAGNOSIS — K219 Gastro-esophageal reflux disease without esophagitis: Secondary | ICD-10-CM

## 2012-07-26 HISTORY — DX: Obstructive sleep apnea (adult) (pediatric): G47.33

## 2012-07-26 HISTORY — DX: Obesity, unspecified: E66.9

## 2012-07-26 HISTORY — DX: Localized edema: R60.0

## 2012-07-26 HISTORY — DX: Endocarditis, valve unspecified: I38

## 2012-07-26 HISTORY — DX: Dependence on other enabling machines and devices: Z99.89

## 2012-07-26 HISTORY — DX: Polyneuropathy, unspecified: G62.9

## 2012-07-26 HISTORY — DX: Unspecified abnormal cytological findings in specimens from cervix uteri: R87.619

## 2012-07-26 LAB — CBC
HCT: 21.3 % — ABNORMAL LOW (ref 36.0–46.0)
MCV: 80.1 fL (ref 78.0–100.0)
RBC: 2.66 MIL/uL — ABNORMAL LOW (ref 3.87–5.11)
WBC: 4.8 10*3/uL (ref 4.0–10.5)

## 2012-07-26 LAB — BASIC METABOLIC PANEL
BUN: 19 mg/dL (ref 6–23)
Chloride: 103 mEq/L (ref 96–112)
GFR calc Af Amer: 69 mL/min — ABNORMAL LOW (ref >90)
Potassium: 4.6 mEq/L (ref 3.5–5.1)

## 2012-07-26 LAB — GLUCOSE, CAPILLARY
Glucose-Capillary: 91 mg/dL (ref 70–99)
Glucose-Capillary: 151 mg/dL — ABNORMAL HIGH (ref 70–99)

## 2012-07-26 MED ORDER — COLLAGENASE 250 UNIT/GM EX OINT: TOPICAL_OINTMENT | Freq: Every day | CUTANEOUS | Status: DC

## 2012-07-26 MED ORDER — HYDROCODONE-ACETAMINOPHEN 5-325 MG PO TABS: 1.0000 | ORAL_TABLET | ORAL | Status: DC

## 2012-07-26 MED ORDER — INSULIN GLARGINE 100 UNIT/ML ~~LOC~~ SOLN: 15.0000 [IU] | Freq: Every day | SUBCUTANEOUS | Status: DC

## 2012-07-26 MED ORDER — INSULIN ASPART 100 UNIT/ML ~~LOC~~ SOLN: 0.0000 [IU] | Freq: Three times a day (TID) | SUBCUTANEOUS | Status: DC

## 2012-07-26 MED ORDER — INSULIN ASPART 100 UNIT/ML ~~LOC~~ SOLN: 4.0000 [IU] | Freq: Three times a day (TID) | SUBCUTANEOUS | Status: DC

## 2012-07-26 MED ORDER — INSULIN ASPART 100 UNIT/ML ~~LOC~~ SOLN: 0.0000 [IU] | Freq: Every day | SUBCUTANEOUS | Status: DC

## 2012-07-26 NOTE — Progress Notes (Signed)
Physical Therapy Wound Treatment Patient Details  Name: AZALIYAH PATLAN MRN: [id number] Date of Birth: August 09, 1975  Today's Date: 07/26/2012 Time: [id number] Time Calculation (min): 56 min  Subjective  Subjective: Pt with no new complaints today, still reporting general itching from healing process Patient and Family Stated Goals: heal the wounds  Pain Score: Pain Score:   2  Wound Assessment    07/26/12 1500  Subjective Assessment  Subjective Pt with no new complaints today, still reporting general itching from healing process  Patient and Family Stated Goals heal the wounds  Evaluation and Treatment  Evaluation and Treatment Procedures Explained to Patient/Family Yes  Evaluation and Treatment Procedures agreed to  Wound 07/17/12 Perineum Medial Open abscess perineum and thigh   Date First Assessed/Time First Assessed: 07/17/12 1200   Location: Perineum  Location Orientation: Medial  Wound Description (Comments): Open abscess perineum and thigh   Present on Admission: Yes  Site / Wound Assessment Painful;Pink;Yellow;Red  % Wound base Red or Granulating 75%  % Wound base Yellow 20%  % Wound base Black 0%  % Wound base Other (Comment) 5% (subcutaneous fatty tissue, exposed muscle tissue)  Peri-wound Assessment Erythema (blanchable);Maceration;Pink  Margins Unattacted edges (unapproximated)  Closure None  Drainage Amount Moderate  Drainage Description Serosanguineous  Treatment Debridement (Selective);Hydrotherapy (Pulse lavage);Packing (Saline gauze);Tape changed  Dressing Type ABD;Gauze (Comment);Moist to dry (santyl)  Dressing Changed Changed  Dressing Status Clean;Dry;Intact  Wound 07/17/12 Abdomen Left Necrotizing Faciitis  Date First Assessed: 07/17/12   Location: Abdomen  Location Orientation: Left  Wound Description (Comments): Necrotizing Faciitis  Site / Wound Assessment Granulation tissue;Painful;Pale;Pink;Yellow  % Wound base Red or Granulating 75%  % Wound base  Yellow 25%  % Wound base Black 0%  % Wound base Other (Comment) 0%  Peri-wound Assessment Erythema (blanchable)  Margins Unattacted edges (unapproximated)  Closure None  Drainage Amount Minimal  Drainage Description Serosanguineous  Treatment Debridement (Selective);Hydrotherapy (Pulse lavage);Packing (Saline gauze);Tape changed  Dressing Type ABD;Gauze (Comment);Moist to dry (santyl)  Dressing Changed Changed  Dressing Status Clean;Dry;Intact  Hydrotherapy  Pulsed Lavage with Suction (psi) 4 psi  Pulsed Lavage with Suction - Normal Saline Used 2000 mL  Pulsed Lavage Tip Tip with splash shield  Pulsed lavage therapy - wound location perinieum; and thigh wounds  Selective Debridement  Selective Debridement - Location perineum and thigh wound  Selective Debridement - Tools Used Forceps;Scissors  Selective Debridement - Tissue Removed slough  Wound Therapy - Assess/Plan/Recommendations  Wound Therapy - Clinical Statement No significant changes in majority of wound bed.  Area of concern does exist on posterior side of wound bed; unseen prior to today secondary to patient positioning.  This area is filled with hardened necrotic tissue and appears to range to unknown depths. Attempts to debride area did not seem to help secondary to inability to position patient in an acessible manner. Will continue to monitor and consult with surgical team next therapy session. Patient informed of area of concern and advised that we will continue with application of Santyl to facilitate further debridement, Pt appreciative for the update. Will continue to perform hydrotherapy as indicated.  Wound Therapy - Functional Problem List decreased mobility; increased pain, decreased skin itegrity  Factors Delaying/Impairing Wound Healing Altered sensation;Infection - systemic/local;Immobility;Vascular compromise  Hydrotherapy Plan Debridement;Dressing change;Patient/family education;Pulsatile lavage with suction  Wound  Therapy - Frequency 6X / week  Wound Therapy - Follow Up Recommendations Home health RN;Other (comment) (vs SNF pending plan of care for wound (Vac vs hydrotherapy))  Wound  Plan continue to treat as indicated  Wound Therapy Goals - Improve the function of patient's integumentary system by progressing the wound(s) through the phases of wound healing by:  Decrease Necrotic Tissue to <10% all wounds  Decrease Necrotic Tissue - Progress Progressing toward goal  Increase Granulation Tissue to >90% all wounds  Increase Granulation Tissue - Progress Progressing toward goal  Improve Drainage Characteristics Min  Improve Drainage Characteristics - Progress Not progressing  Patient/Family will be able to  demontrate good physiological management contributions (eating well, participating in therapy; understanding of hygiene techniques)  Patient/Family Instruction Goal - Progress Met  Goals/treatment plan/discharge plan were made with and agreed upon by patient/family Yes  Time For Goal Achievement 7 days  Wound Therapy - Potential for Goals Good   Alben Deeds, PT DPT  [phone]

## 2012-07-26 NOTE — Progress Notes (Signed)
Pt agreed to complete informed consent paperwork for counseling services to begin today. Pt discussed anxieties and worries regarding her living situation with her mother. Pt described her preference for being alone. Pt is committed to focusing on her herself and embarking on her self-care plan. Pt stated that today's session has been helpful and that processing her worries  has been helpful in relieving her pain. Pt expressed appreciation for counseling.   Elmarie Shiley Counselor Intern [facility]

## 2012-07-26 NOTE — Progress Notes (Signed)
Subjective:    Feeling well this AM. Gluteal/groin pain slowly improving. No new complaints. Denies CP/SOB.   Interval Events: No acute events.    Objective:    Vital Signs:   Temp:  [97.4 F (36.3 C)-98.3 F (36.8 C)] 98.1 F (36.7 C) (03/06 0552) Pulse Rate:  [88-99] 94 (03/06 0552) Resp:  [17-18] 17 (03/06 0552) BP: (112-127)/(59-79) 127/79 mmHg (03/06 0552) SpO2:  [98 %-100 %] 99 % (03/06 0552) Weight:  [313 lb 7.9 oz (142.2 kg)] 313 lb 7.9 oz (142.2 kg) (03/05 2153) Last BM Date: 07/25/12  24-hour weight change: Weight change: 2 lb 3.3 oz (1.001 kg)  Intake/Output:   Intake/Output Summary (Last 24 hours) at 07/26/12 0800 Last data filed at 07/26/12 0553  Gross per 24 hour  Intake    580 ml  Output   4401 ml  Net  -3821 ml      Physical Exam: General: Vital signs reviewed and noted. Well-developed, well-nourished, in no acute distress; alert, appropriate and cooperative throughout examination.  Lungs: Normal respiratory effort. Clear to auscultation BL without crackles or wheezes.  Heart: RRR. S1 and S2 normal without gallop, murmur, or rubs.  Abdomen: BS normoactive. Soft, Nondistended, non-tender. No masses or organomegaly.  Extremities/buttocks/groin: No pretibial edema. Buttock/groin dressing c/d/i.    Labs:  Basic Metabolic Panel:  Recent Labs Lab 07/22/12 0425 07/23/12 0450 07/24/12 0500 07/25/12 0500 07/26/12 0510  NA 137 136 137 136 137  K 4.5 4.5 4.5 4.4 4.6  CL 103 100 100 101 103  CO2 30 32 33* 31 30  GLUCOSE 101* 98 83 126* 160*  BUN 20 19 19 19 19   CREATININE 1.32* 1.24* 1.25* 1.22* 1.16*  CALCIUM 8.5 8.8 8.7 8.6 8.6   CBC:  Recent Labs Lab 07/20/12 0605 07/21/12 0600 07/22/12 0425 07/25/12 0500  WBC 5.9 5.0 5.9 5.2  HGB 7.6* 7.5* 8.2* 7.1*  HCT 23.0* 22.2* 25.3* 21.7*  MCV 79.9 79.9 80.1 78.9  PLT 341 350 405* 312    Cardiac Enzymes:  Recent Labs Lab 07/23/12 0450  CKTOTAL 49    CBG:  Recent Labs Lab  07/25/12 0801 07/25/12 1138 07/25/12 1649 07/25/12 2148 07/26/12 0733  GLUCAP 121* 101* 114* 185* 135*    Microbiology: Results for orders placed during the hospital encounter of 07/06/12  CULTURE, BLOOD (ROUTINE X 2)     Status: None   Collection Time    07/06/12 10:50 AM      Result Value Range Status   Specimen Description BLOOD LEFT ANTECUBITAL   Final   Special Requests BOTTLES DRAWN AEROBIC ONLY 10CC   Final   Culture  Setup Time 07/06/2012 18:19   Final   Culture NO GROWTH 5 DAYS   Final   Report Status 07/12/2012 FINAL   Final  CULTURE, BLOOD (ROUTINE X 2)     Status: None   Collection Time    07/06/12 11:00 AM      Result Value Range Status   Specimen Description BLOOD RIGHT ANTECUBITAL   Final   Special Requests BOTTLES DRAWN AEROBIC ONLY 2CC   Final   Culture  Setup Time 07/06/2012 18:19   Final   Culture NO GROWTH 5 DAYS   Final   Report Status 07/12/2012 FINAL   Final  WOUND CULTURE     Status: None   Collection Time    07/06/12  3:11 PM      Result Value Range Status   Specimen Description WOUND THIGH LEFT  Final   Special Requests PT ON ZOSYN,VANCOMYCIN AND CLEOCIN   Final   Gram Stain     Final   Value: RARE WBC PRESENT, PREDOMINANTLY PMN     NO SQUAMOUS EPITHELIAL CELLS SEEN     ABUNDANT GRAM POSITIVE RODS     FEW GRAM POSITIVE COCCI     IN PAIRS IN CLUSTERS   Culture     Final   Value: RARE METHICILLIN RESISTANT STAPHYLOCOCCUS AUREUS     Note: RIFAMPIN AND GENTAMICIN SHOULD NOT BE USED AS SINGLE DRUGS FOR TREATMENT OF STAPH INFECTIONS. This organism DOES NOT demonstrate inducible Clindamycin resistance in vitro. CRITICAL RESULT CALLED TO, READ BACK BY AND VERIFIED WITH: ROBIN WOFFORD BY      INGRAM A 2/18 8AM   Report Status 07/10/2012 FINAL   Final   Organism ID, Bacteria METHICILLIN RESISTANT STAPHYLOCOCCUS AUREUS   Final  ANAEROBIC CULTURE     Status: None   Collection Time    07/06/12  3:11 PM      Result Value Range Status   Specimen  Description WOUND THIGH LEFT   Final   Special Requests PT ON ZOSYN,VANCOMYCIN AND CLEOCIN   Final   Gram Stain     Final   Value: RARE WBC PRESENT, PREDOMINANTLY PMN     NO SQUAMOUS EPITHELIAL CELLS SEEN     ABUNDANT GRAM POSITIVE RODS     FEW GRAM POSITIVE COCCI IN PAIRS     IN CLUSTERS   Culture NO ANAEROBES ISOLATED   Final   Report Status 07/11/2012 FINAL   Final  MRSA PCR SCREENING     Status: Abnormal   Collection Time    07/06/12  8:12 PM      Result Value Range Status   MRSA by PCR POSITIVE (*) NEGATIVE Final   Comment:            The GeneXpert MRSA Assay (FDA     approved for NASAL specimens     only), is one component of a     comprehensive MRSA colonization     surveillance program. It is not     intended to diagnose MRSA     infection nor to guide or     monitor treatment for     MRSA infections.     RESULT CALLED TO, READ BACK BY AND VERIFIED WITH:     S COGGINS RN 2.14.14 AT 2215 BY ROMEROJ      Medications:    Infusions:    Scheduled Medications: . amitriptyline  50 mg Oral QHS  . bd getting started take home kit  1 kit Other Once  . clindamycin (CLEOCIN) IV  600 mg Intravenous Q8H  . collagenase   Topical Daily  . diphenhydrAMINE  25 mg Intravenous Once  . feeding supplement  237 mL Oral Q24H  . feeding supplement  30 mL Oral BID  . FLUoxetine  40 mg Oral Daily  . heparin subcutaneous  5,000 Units Subcutaneous Q8H  . HYDROcodone-acetaminophen  1-2 tablet Oral Q4H  . insulin aspart  0-15 Units Subcutaneous TID WC  . insulin aspart  0-5 Units Subcutaneous QHS  . insulin aspart  4 Units Subcutaneous TID WC  . insulin glargine  15 Units Subcutaneous QHS  . multivitamin with minerals  1 tablet Oral Daily  . sodium chloride  10-40 mL Intracatheter Q12H    PRN Medications: diphenhydrAMINE, HYDROmorphone (DILAUDID) injection, ondansetron (ZOFRAN) IV, sodium chloride   Assessment/ Plan:   Patient  is a 37 yo F with history of uncontrolled DM, HTN,  HLD, anxiety and depression who presented on 07/06/12 with swelling/pain/drainage from her left groin and taken emergently to OR for surgical debridement. S/p I/D and multiple debridements this admission.   Necrotizing fascitis: admitted on 2/14 and taken emergently to OR for surgical debridement of site suspicious for nec fasc. Over L groin. Pt was then immediately started on Vanc/zosyn/clinda. Repeat debridement of necrotic tissue on 2/15, 2/16, 2/17, 2/24. Minimal blood loss. Wound culture --> MRSA sensitive to clinda.  -continue Clindamycin (day 20 - will likely d/c within 24 hours as pt has received 3 wks of abx therapy and no further debridements indicated, per gen surg) -continue hydrotherapy  -Pain mgmt with Norco 5-325, 1-2 tab q4h prn, dilaudid 1-2mg  q4h prn (esp prior to hydrotherapy)  -surgery following--wound vac not an option for this wound given its extensiveness  -social work consult (made on 3/3: pt states she is still waiting to be seen by social work to discuss LTAC/SNF options for discharge. At this point pt is medically stable and this is the only issue holding up her discharge).   Acute Anemia: Hb decreased on 3/5 to 7.1. - f/u AM CBC -transfuse <7.0   Poorly controlled DM type 2: Hgb A1c 15.4 and outpt meds include metformin and glipizide. Pt initially presented in DKA on 2/14 with AG of 24 that resolved after fluids. No ketones in urine. Pt had episodes of hypoglycemia into the 60s so Lantus decreased to 15U.  -cont SSI with lantus and Novolog 4u TIDWC  -carb modified diet  -will need to d/c with Novolog 70/30 d/t cost   Depression: Stable, no SI/HI  -continue home prozac   DVT PPX - heparin  CODE STATUS - full  CONSULTS PLACED - Gen surgery  DISPO - Disposition is deferred at this time, awaiting assistance from [place] for placement in LTAC or SNF, per above (CSW consulted for this issue on 3/3 (with repeat consult order and telephone call to [place] made on 3/4), and this  issue is currently what is keeping patient hospitalized as her med issues are stable). Anticipated discharge in approximately 1-2 day(s).  The patient does have a current [place] PCP (PRIBULA,CHRISTOPHER, MD) and does need an [facility] hospital follow-up appointment after discharge.  Is the [facility] hospital follow-up appointment a one-time only appointment? not applicable.  Does the patient have transportation limitations that hinder transportation to clinic appointments? no  SERVICE NEEDED AT [place] [facility] = Yes, Blank = No  PT:    OT:    RN:    Equipment:    Other:  LTAC/SNF     Length of Stay: 20 day(s)   Signed: Gae Gallop, MD  PGY-1, Internal Medicine Resident Pager: [phone] (7AM-5PM) 07/26/2012, 8:00 AM

## 2012-07-26 NOTE — Progress Notes (Signed)
Report was call to [facility].IV team was call to saline lock the PICC line.pt. Ready to be transfer

## 2012-07-26 NOTE — Care Management Note (Signed)
   CARE MANAGEMENT NOTE 07/26/2012  Patient:  NIYATI, AMBLE   Account Number:  [id number]  Date Initiated:  07/10/2012  Documentation initiated by:  Marvetta Gibbons  Subjective/Objective Assessment:   Pt admitted with Necrotizing Fasciitis     Action/Plan:   PTA pt lived at home  07/26/2012 Ongoing efforts to place pt in [place] for ongoing hydrotherapy and rehab.   Anticipated DC Date:  07/27/2012   Anticipated DC Plan:  LONG TERM ACUTE CARE (LTAC)      DC Planning Services  CM consult      Choice offered to / List presented to:             Status of service:  In process, will continue to follow Medicare Important Message given?   (If response is "NO", the following Medicare IM given date fields will be blank) Date Medicare IM given:   Date Additional Medicare IM given:    Discharge Disposition:    Per UR Regulation:  Reviewed for med. necessity/level of care/duration of stay  If discussed at [place] of Stay Meetings, dates discussed:   07/12/2012  07/17/2012  07/19/2012  07/24/2012    Comments:  07/25/2012  [place], [place]  07/26/2012 Per [facility] representative plan is to accept this pt as a charity case and plan to adm 03/06, however at this time 2:20pm [facility] is still awaiting final approval from [employer] for this pt to be admitted. This CM spoke with Md to update. Will also update pt. Jasmine Pang RN MPH Case manager [phone]  Financial Counselor/Maddie:  mailed completed Medicaid application [id number]  [id number] LTAC/ [facility] patient could be considered for admission as charity case. However, with the Medicaid application completed, the patient is considered as having a possible payor source and not a charity case.   07/24/2012  [place] [place] [facility]  [facility] called with referral   07/11/12- 1400- Marvetta Gibbons RN, BSN [phone] Spoke with pt at bedside- per conversation with pt she has had  [place] in the past with [facility] for wound care- is open to [facility] services again with [facility] at discharge-.  for wound care- NCM to follow for d/c planning and needs.  07/10/12- 1200- Marvetta Gibbons RN, BSN [phone] Pt post op day 1 I&D of left thigh wound

## 2012-07-27 ENCOUNTER — Other Ambulatory Visit (HOSPITAL_COMMUNITY): Payer: Self-pay

## 2012-07-27 LAB — CBC
HCT: 20.8 % — ABNORMAL LOW (ref 36.0–46.0)
Hemoglobin: 6.9 g/dL — CL (ref 12.0–15.0)
RDW: 14.1 % (ref 11.5–15.5)
WBC: 4.5 10*3/uL (ref 4.0–10.5)

## 2012-07-27 LAB — URINALYSIS, ROUTINE W REFLEX MICROSCOPIC
Bilirubin Urine: NEGATIVE
Protein, ur: NEGATIVE mg/dL
Specific Gravity, Urine: 1.011 (ref 1.005–1.030)
Urobilinogen, UA: 0.2 mg/dL (ref 0.0–1.0)

## 2012-07-27 LAB — URINE MICROSCOPIC-ADD ON

## 2012-07-27 LAB — COMPREHENSIVE METABOLIC PANEL
AST: 11 U/L (ref 0–37)
Albumin: 1.7 g/dL — ABNORMAL LOW (ref 3.5–5.2)
Calcium: 9 mg/dL (ref 8.4–10.5)
Chloride: 99 mEq/L (ref 96–112)
Creatinine, Ser: 1.17 mg/dL — ABNORMAL HIGH (ref 0.50–1.10)

## 2012-07-27 LAB — PROCALCITONIN: Procalcitonin: 0.17 ng/mL

## 2012-07-27 NOTE — Discharge Summary (Signed)
Internal Medicine Teaching Service Attending Note Date: 07/27/2012  Patient name: [name] [name]  Medical record number: [id number]  Date of birth: [birth year]    I evaluated the patient on the day of discharge and discussed the discharge plan with my resident team. I agree with Dr. Marciano Sequin discharge documentation and disposition.     Thanks Madilyn Fireman 07/27/2012, 4:08 PM

## 2012-07-28 LAB — TYPE AND SCREEN
ABO/RH(D): B POS
Antibody Screen: NEGATIVE
Unit division: 0

## 2012-07-28 LAB — HEMOGLOBIN AND HEMATOCRIT, BLOOD: HCT: 29.7 % — ABNORMAL LOW (ref 36.0–46.0)

## 2012-07-29 LAB — CBC WITH DIFFERENTIAL/PLATELET
Basophils Absolute: 0 10*3/uL (ref 0.0–0.1)
Basophils Relative: 1 % (ref 0–1)
Eosinophils Absolute: 0.5 10*3/uL (ref 0.0–0.7)
Lymphs Abs: 2.5 10*3/uL (ref 0.7–4.0)
MCH: 25.9 pg — ABNORMAL LOW (ref 26.0–34.0)
MCHC: 33.2 g/dL (ref 30.0–36.0)
Neutrophils Relative %: 38 % — ABNORMAL LOW (ref 43–77)
Platelets: 309 10*3/uL (ref 150–400)
RBC: 3.16 MIL/uL — ABNORMAL LOW (ref 3.87–5.11)
RDW: 14.3 % (ref 11.5–15.5)

## 2012-07-29 LAB — URINE CULTURE

## 2012-07-29 LAB — BASIC METABOLIC PANEL
GFR calc Af Amer: 70 mL/min — ABNORMAL LOW (ref >90)
GFR calc non Af Amer: 60 mL/min — ABNORMAL LOW (ref >90)
Potassium: 4.3 mEq/L (ref 3.5–5.1)
Sodium: 135 mEq/L (ref 135–145)

## 2012-07-29 LAB — PROTIME-INR
INR: 1.13 (ref 0.00–1.49)
Prothrombin Time: 14.3 seconds (ref 11.6–15.2)

## 2012-07-30 LAB — CBC WITH DIFFERENTIAL/PLATELET
Basophils Absolute: 0 10*3/uL (ref 0.0–0.1)
Basophils Relative: 0 % (ref 0–1)
Eosinophils Relative: 6 % — ABNORMAL HIGH (ref 0–5)
Lymphocytes Relative: 32 % (ref 12–46)
MCHC: 32.8 g/dL (ref 30.0–36.0)
Neutro Abs: 3.7 10*3/uL (ref 1.7–7.7)
Platelets: 307 10*3/uL (ref 150–400)
RDW: 14 % (ref 11.5–15.5)
WBC: 7.6 10*3/uL (ref 4.0–10.5)

## 2012-07-30 LAB — IRON AND TIBC
Saturation Ratios: 7 % — ABNORMAL LOW (ref 20–55)
TIBC: 208 ug/dL — ABNORMAL LOW (ref 250–470)

## 2012-07-30 LAB — HEMOGLOBIN AND HEMATOCRIT, BLOOD
HCT: 25.5 % — ABNORMAL LOW (ref 36.0–46.0)
Hemoglobin: 8.4 g/dL — ABNORMAL LOW (ref 12.0–15.0)

## 2012-07-30 LAB — BASIC METABOLIC PANEL
CO2: 29 mEq/L (ref 19–32)
Calcium: 8.7 mg/dL (ref 8.4–10.5)
Chloride: 98 mEq/L (ref 96–112)
GFR calc Af Amer: 66 mL/min — ABNORMAL LOW (ref >90)
Sodium: 133 mEq/L — ABNORMAL LOW (ref 135–145)

## 2012-07-31 LAB — CBC
HCT: 30.1 % — ABNORMAL LOW (ref 36.0–46.0)
Hemoglobin: 10.3 g/dL — ABNORMAL LOW (ref 12.0–15.0)
MCH: 26.8 pg (ref 26.0–34.0)
MCHC: 34.2 g/dL (ref 30.0–36.0)
MCV: 78.2 fL (ref 78.0–100.0)
Platelets: 293 K/uL (ref 150–400)
RBC: 3.85 MIL/uL — ABNORMAL LOW (ref 3.87–5.11)
RDW: 14.1 % (ref 11.5–15.5)
WBC: 7.2 K/uL (ref 4.0–10.5)

## 2012-07-31 LAB — BASIC METABOLIC PANEL
BUN: 17 mg/dL (ref 6–23)
Calcium: 8.8 mg/dL (ref 8.4–10.5)
GFR calc Af Amer: 67 mL/min — ABNORMAL LOW (ref >90)
GFR calc non Af Amer: 58 mL/min — ABNORMAL LOW (ref >90)
Potassium: 4.3 mEq/L (ref 3.5–5.1)
Sodium: 133 mEq/L — ABNORMAL LOW (ref 135–145)

## 2012-07-31 NOTE — Progress Notes (Signed)
  Echocardiogram 2D Echocardiogram has been performed.  [name] [name] 07/31/2012, 4:09 PM

## 2012-08-01 LAB — BASIC METABOLIC PANEL
Calcium: 8.9 mg/dL (ref 8.4–10.5)
GFR calc non Af Amer: 57 mL/min — ABNORMAL LOW (ref >90)
Glucose, Bld: 179 mg/dL — ABNORMAL HIGH (ref 70–99)
Potassium: 4.2 mEq/L (ref 3.5–5.1)
Sodium: 132 mEq/L — ABNORMAL LOW (ref 135–145)

## 2012-08-01 LAB — CBC
Hemoglobin: 7.5 g/dL — ABNORMAL LOW (ref 12.0–15.0)
MCHC: 32.2 g/dL (ref 30.0–36.0)
Platelets: 370 10*3/uL (ref 150–400)
RBC: 2.89 MIL/uL — ABNORMAL LOW (ref 3.87–5.11)

## 2012-08-02 ENCOUNTER — Telehealth: Payer: Self-pay | Admitting: Dietician

## 2012-08-02 LAB — URINALYSIS, ROUTINE W REFLEX MICROSCOPIC
Glucose, UA: NEGATIVE mg/dL
Ketones, ur: 15 mg/dL — AB
Nitrite: POSITIVE — AB
pH: 5 (ref 5.0–8.0)

## 2012-08-02 LAB — BASIC METABOLIC PANEL
BUN: 18 mg/dL (ref 6–23)
CO2: 26 mEq/L (ref 19–32)
Chloride: 96 mEq/L (ref 96–112)
GFR calc non Af Amer: 56 mL/min — ABNORMAL LOW (ref >90)
Glucose, Bld: 159 mg/dL — ABNORMAL HIGH (ref 70–99)
Potassium: 4.4 mEq/L (ref 3.5–5.1)
Sodium: 131 mEq/L — ABNORMAL LOW (ref 135–145)

## 2012-08-02 LAB — CBC WITH DIFFERENTIAL/PLATELET
Hemoglobin: 7 g/dL — ABNORMAL LOW (ref 12.0–15.0)
Lymphocytes Relative: 26 % (ref 12–46)
Lymphs Abs: 2.5 10*3/uL (ref 0.7–4.0)
MCH: 26.6 pg (ref 26.0–34.0)
Monocytes Relative: 13 % — ABNORMAL HIGH (ref 3–12)
Neutro Abs: 5.4 10*3/uL (ref 1.7–7.7)
Neutrophils Relative %: 56 % (ref 43–77)
Platelets: 334 10*3/uL (ref 150–400)
RBC: 2.63 MIL/uL — ABNORMAL LOW (ref 3.87–5.11)
WBC: 9.7 10*3/uL (ref 4.0–10.5)

## 2012-08-02 LAB — URINE MICROSCOPIC-ADD ON

## 2012-08-02 LAB — HEMOGLOBIN AND HEMATOCRIT, BLOOD: HCT: 21.3 % — ABNORMAL LOW (ref 36.0–46.0)

## 2012-08-03 LAB — URINE CULTURE

## 2012-08-03 LAB — GENTAMICIN LEVEL, RANDOM: Gentamicin Rm: 1.9 ug/mL

## 2012-08-03 LAB — HEMOGLOBIN AND HEMATOCRIT, BLOOD: Hemoglobin: 7.6 g/dL — ABNORMAL LOW (ref 12.0–15.0)

## 2012-08-04 LAB — BASIC METABOLIC PANEL
BUN: 22 mg/dL (ref 6–23)
CO2: 26 mEq/L (ref 19–32)
Chloride: 98 mEq/L (ref 96–112)
GFR calc non Af Amer: 57 mL/min — ABNORMAL LOW (ref >90)
Glucose, Bld: 124 mg/dL — ABNORMAL HIGH (ref 70–99)
Potassium: 4.1 mEq/L (ref 3.5–5.1)
Sodium: 133 mEq/L — ABNORMAL LOW (ref 135–145)

## 2012-08-04 LAB — PREPARE RBC (CROSSMATCH)

## 2012-08-04 LAB — CBC WITH DIFFERENTIAL/PLATELET
Hemoglobin: 6.4 g/dL — CL (ref 12.0–15.0)
Lymphocytes Relative: 22 % (ref 12–46)
Lymphs Abs: 2.2 10*3/uL (ref 0.7–4.0)
Monocytes Relative: 12 % (ref 3–12)
Neutrophils Relative %: 60 % (ref 43–77)
Platelets: 375 10*3/uL (ref 150–400)
RBC: 2.49 MIL/uL — ABNORMAL LOW (ref 3.87–5.11)
WBC: 10.2 10*3/uL (ref 4.0–10.5)

## 2012-08-04 LAB — HEMOGLOBIN AND HEMATOCRIT, BLOOD: HCT: 22.9 % — ABNORMAL LOW (ref 36.0–46.0)

## 2012-08-05 LAB — HEMOGLOBIN AND HEMATOCRIT, BLOOD
HCT: 21.2 % — ABNORMAL LOW (ref 36.0–46.0)
HCT: 24.2 % — ABNORMAL LOW (ref 36.0–46.0)
Hemoglobin: 7 g/dL — ABNORMAL LOW (ref 12.0–15.0)

## 2012-08-06 DIAGNOSIS — A419 Sepsis, unspecified organism: Secondary | ICD-10-CM

## 2012-08-06 LAB — CBC WITH DIFFERENTIAL/PLATELET
Basophils Absolute: 0 10*3/uL (ref 0.0–0.1)
Basophils Relative: 0 % (ref 0–1)
Eosinophils Relative: 5 % (ref 0–5)
HCT: 19.6 % — ABNORMAL LOW (ref 36.0–46.0)
MCHC: 33.2 g/dL (ref 30.0–36.0)
MCV: 77.5 fL — ABNORMAL LOW (ref 78.0–100.0)
Monocytes Absolute: 1.2 10*3/uL — ABNORMAL HIGH (ref 0.1–1.0)
Neutro Abs: 5.2 10*3/uL (ref 1.7–7.7)
RDW: 14.2 % (ref 11.5–15.5)

## 2012-08-06 LAB — WOUND CULTURE

## 2012-08-06 LAB — HEMOGLOBIN AND HEMATOCRIT, BLOOD: HCT: 21.3 % — ABNORMAL LOW (ref 36.0–46.0)

## 2012-08-06 LAB — OCCULT BLOOD X 1 CARD TO LAB, STOOL: Fecal Occult Bld: NEGATIVE

## 2012-08-06 LAB — COMPREHENSIVE METABOLIC PANEL
AST: 26 U/L (ref 0–37)
Albumin: 1.5 g/dL — ABNORMAL LOW (ref 3.5–5.2)
Calcium: 8.8 mg/dL (ref 8.4–10.5)
Creatinine, Ser: 1.33 mg/dL — ABNORMAL HIGH (ref 0.50–1.10)

## 2012-08-06 LAB — GENTAMICIN LEVEL, TROUGH: Gentamicin Trough: 1 ug/mL (ref 0.5–2.0)

## 2012-08-06 LAB — PREPARE RBC (CROSSMATCH)

## 2012-08-06 NOTE — Consult Note (Signed)
[place] for Infectious Disease     Reason for Consult: wound infection    Referring Physician: Dr. Llana Aliment  Active Problems:   * No active hospital problems. *     Recommendations: Change clindamycin to vancomycin and treat for 1 week through 3/24 Gentamicin for UTI as you are doing for 7 days total, no need to treat yeast Remove foley if able   Assessment: She is well known to me from the internal medicine service.  She came in with MRSA necrotizing fasciitis and now getting wound care in [facility].  She had some pus and required some debridement of the area over the last several days and superficial culture with clindamycin resistant MRSA.     Antibiotics: Clindamycin day 5 Gentamicin day 5  HPI: [name] [name] is a 37 y.o. female with poorly controlled diabetes who presented to [facility] with necrotizing fasciitis in February.  She required multiple surgeries and hydrotherapy to control the infection and grew clindamycin sensitive MRSA.  She was initially treated empirically and then continued with clindamycin until recovery.  She was transferred to [facility] on 3/6 for continued wound management.  She has been noted to have some purulence in the wound area and is repeatedly picking at the area with her fingers despite concern for infecting it.  She also is having difficulty with bladder spasm and pain. She recently had a VAC placed on wound to facilitate healing, particularly with the manipulation.     Review of Systems: Pertinent items are noted in HPI.  Past Medical History  Diagnosis Date  . Diabetes mellitus     Type 2  . Hyperlipidemia   . Hypertension   . Anxiety   . Depression   . Abscess     history of multiple abscesses    History  Substance Use Topics  . Smoking status: Former Smoker -- 1.00 packs/day  . Smokeless tobacco: Not on file  . Alcohol Use: Yes     Comment: ocassional    Family History  Problem Relation Age of Onset    . Heart failure Mother   . Diabetes Mother   . Kidney disease Mother   . Kidney disease Father    No Known Allergies  OBJECTIVE: Last menstrual period 07/06/2012. General: Awake, alert, nad Skin: + wound VAC in groin Lungs: CTA B Cor: RRR without m/r/g Abdomen: morbidly obese, ntnd, +bs Ext: no edema  Microbiology: Recent Results (from the past 240 hour(s))  URINE CULTURE     Status: None   Collection Time    08/02/12  9:12 AM      Result Value Range Status   Specimen Description URINE, RANDOM   Final   Special Requests NONE   Final   Culture  Setup Time 08/02/2012 12:22   Final   Colony Count >=100,000 COLONIES/ML   Final   Culture YEAST   Final   Report Status 08/03/2012 FINAL   Final  WOUND CULTURE     Status: None   Collection Time    08/02/12 12:16 PM      Result Value Range Status   Specimen Description WOUND THIGH LEFT   Final   Special Requests NONE   Final   Gram Stain     Final   Value: FEW WBC PRESENT,BOTH PMN AND MONONUCLEAR     RARE SQUAMOUS EPITHELIAL CELLS PRESENT     NO ORGANISMS SEEN   Culture     Final   Value:  FEW METHICILLIN RESISTANT STAPHYLOCOCCUS AUREUS     Note: RIFAMPIN AND GENTAMICIN SHOULD NOT BE USED AS SINGLE DRUGS FOR TREATMENT OF STAPH INFECTIONS. CRITICAL RESULT CALLED TO, READ BACK BY AND VERIFIED WITH: KAREN P 3/17 @945  BY REAMM   Report Status 08/06/2012 FINAL   Final   Organism ID, Bacteria METHICILLIN RESISTANT STAPHYLOCOCCUS AUREUS   Final    Scharlene Gloss, [place] for Infectious [facility] [phone] pager  [phone] cell 08/06/2012, 11:39 AM

## 2012-08-07 LAB — BASIC METABOLIC PANEL
BUN: 26 mg/dL — ABNORMAL HIGH (ref 6–23)
Creatinine, Ser: 1.25 mg/dL — ABNORMAL HIGH (ref 0.50–1.10)
GFR calc Af Amer: 63 mL/min — ABNORMAL LOW (ref >90)
GFR calc non Af Amer: 55 mL/min — ABNORMAL LOW (ref >90)

## 2012-08-07 NOTE — Telephone Encounter (Signed)
Called to assist with transition of care. Was Informed that patient is still in hospital.

## 2012-08-08 LAB — BASIC METABOLIC PANEL
BUN: 27 mg/dL — ABNORMAL HIGH (ref 6–23)
CO2: 24 mEq/L (ref 19–32)
Calcium: 8.8 mg/dL (ref 8.4–10.5)
Creatinine, Ser: 1.36 mg/dL — ABNORMAL HIGH (ref 0.50–1.10)
GFR calc Af Amer: 57 mL/min — ABNORMAL LOW (ref >90)
GFR calc non Af Amer: 49 mL/min — ABNORMAL LOW (ref >90)
Glucose, Bld: 102 mg/dL — ABNORMAL HIGH (ref 70–99)

## 2012-08-08 LAB — TYPE AND SCREEN
ABO/RH(D): B POS
Antibody Screen: NEGATIVE
Unit division: 0

## 2012-08-09 LAB — CBC WITH DIFFERENTIAL/PLATELET
Basophils Absolute: 0 10*3/uL (ref 0.0–0.1)
Eosinophils Absolute: 0.5 10*3/uL (ref 0.0–0.7)
HCT: 26.8 % — ABNORMAL LOW (ref 36.0–46.0)
Lymphs Abs: 2.4 10*3/uL (ref 0.7–4.0)
MCH: 26.2 pg (ref 26.0–34.0)
MCHC: 32.5 g/dL (ref 30.0–36.0)
MCV: 80.7 fL (ref 78.0–100.0)
Monocytes Relative: 11 % (ref 3–12)
Neutro Abs: 5.5 10*3/uL (ref 1.7–7.7)
RDW: 14.3 % (ref 11.5–15.5)

## 2012-08-09 LAB — BASIC METABOLIC PANEL
BUN: 26 mg/dL — ABNORMAL HIGH (ref 6–23)
GFR calc non Af Amer: 48 mL/min — ABNORMAL LOW (ref >90)
Glucose, Bld: 91 mg/dL (ref 70–99)
Potassium: 4.2 mEq/L (ref 3.5–5.1)

## 2012-08-10 ENCOUNTER — Ambulatory Visit
Admission: AD | Disposition: A | Discharge status: Home Health | Payer: Self-pay | Source: Ambulatory Visit | Attending: Internal Medicine

## 2012-08-10 ENCOUNTER — Encounter (HOSPITAL_COMMUNITY): Payer: Self-pay | Admitting: Anesthesiology

## 2012-08-10 ENCOUNTER — Encounter: Payer: Self-pay | Admitting: Anesthesiology

## 2012-08-10 DIAGNOSIS — L03319 Cellulitis of trunk, unspecified: Secondary | ICD-10-CM

## 2012-08-10 DIAGNOSIS — L02219 Cutaneous abscess of trunk, unspecified: Secondary | ICD-10-CM

## 2012-08-10 DIAGNOSIS — L02419 Cutaneous abscess of limb, unspecified: Secondary | ICD-10-CM

## 2012-08-10 DIAGNOSIS — L03119 Cellulitis of unspecified part of limb: Secondary | ICD-10-CM

## 2012-08-10 HISTORY — PX: IRRIGATION AND DEBRIDEMENT ABSCESS: SHX5252

## 2012-08-10 LAB — GLUCOSE, CAPILLARY
Glucose-Capillary: 83 mg/dL (ref 70–99)
Glucose-Capillary: 97 mg/dL (ref 70–99)
Glucose-Capillary: 99 mg/dL (ref 70–99)

## 2012-08-10 LAB — CBC WITH DIFFERENTIAL/PLATELET
Basophils Relative: 0 % (ref 0–1)
Eosinophils Absolute: 0.4 10*3/uL (ref 0.0–0.7)
Eosinophils Relative: 6 % — ABNORMAL HIGH (ref 0–5)
HCT: 25.8 % — ABNORMAL LOW (ref 36.0–46.0)
Hemoglobin: 8.6 g/dL — ABNORMAL LOW (ref 12.0–15.0)
MCH: 26.4 pg (ref 26.0–34.0)
MCHC: 33.3 g/dL (ref 30.0–36.0)
MCV: 79.1 fL (ref 78.0–100.0)
Monocytes Absolute: 0.9 10*3/uL (ref 0.1–1.0)
Monocytes Relative: 13 % — ABNORMAL HIGH (ref 3–12)

## 2012-08-10 LAB — BASIC METABOLIC PANEL
BUN: 26 mg/dL — ABNORMAL HIGH (ref 6–23)
CO2: 27 mEq/L (ref 19–32)
Chloride: 104 mEq/L (ref 96–112)
Creatinine, Ser: 1.51 mg/dL — ABNORMAL HIGH (ref 0.50–1.10)
GFR calc Af Amer: 50 mL/min — ABNORMAL LOW (ref >90)

## 2012-08-10 SURGERY — IRRIGATION AND DEBRIDEMENT ABSCESS
Anesthesia: General | Site: Thigh | Laterality: Left | Wound class: Dirty or Infected

## 2012-08-10 MED ORDER — FENTANYL CITRATE 0.05 MG/ML IJ SOLN
INTRAMUSCULAR | Status: DC | PRN
  Administered 2012-08-10: 50 ug via INTRAVENOUS

## 2012-08-10 MED ORDER — LIDOCAINE HCL (CARDIAC) 20 MG/ML IV SOLN
INTRAVENOUS | Status: DC | PRN
  Administered 2012-08-10: 30 mg via INTRAVENOUS

## 2012-08-10 MED ORDER — 0.9 % SODIUM CHLORIDE (POUR BTL) OPTIME
TOPICAL | Status: DC | PRN
  Administered 2012-08-10: 1000 mL

## 2012-08-10 MED ORDER — ONDANSETRON HCL 4 MG/2ML IJ SOLN: 4.0000 mg | Freq: Once | INTRAMUSCULAR | Status: AC | PRN

## 2012-08-10 MED ORDER — PROPOFOL 10 MG/ML IV BOLUS
INTRAVENOUS | Status: DC | PRN
  Administered 2012-08-10: 200 mg via INTRAVENOUS

## 2012-08-10 MED ORDER — ARTIFICIAL TEARS OP OINT
TOPICAL_OINTMENT | OPHTHALMIC | Status: DC | PRN
  Administered 2012-08-10: 1 via OPHTHALMIC

## 2012-08-10 MED ORDER — HYDROMORPHONE HCL PF 1 MG/ML IJ SOLN
0.2500 mg | INTRAMUSCULAR | Status: DC | PRN
  Administered 2012-08-10: 0.5 mg via INTRAVENOUS

## 2012-08-10 MED ORDER — LACTATED RINGERS IV SOLN
INTRAVENOUS | Status: DC | PRN
  Administered 2012-08-10: 09:00:00 via INTRAVENOUS

## 2012-08-10 MED ORDER — SUCCINYLCHOLINE CHLORIDE 20 MG/ML IJ SOLN
INTRAMUSCULAR | Status: DC | PRN
  Administered 2012-08-10: 100 mg via INTRAVENOUS

## 2012-08-10 MED ORDER — LIDOCAINE HCL 4 % MT SOLN
OROMUCOSAL | Status: DC | PRN
  Administered 2012-08-10: 4 mL via TOPICAL

## 2012-08-10 MED ORDER — LIDOCAINE-EPINEPHRINE (PF) 1 %-1:200000 IJ SOLN
INTRAMUSCULAR | Status: DC | PRN
  Administered 2012-08-10: 10:00:00

## 2012-08-10 MED ORDER — ONDANSETRON HCL 4 MG/2ML IJ SOLN
INTRAMUSCULAR | Status: DC | PRN
  Administered 2012-08-10: 4 mg via INTRAVENOUS

## 2012-08-10 SURGICAL SUPPLY — 44 items
BANDAGE GAUZE ELAST BULKY 4 IN (GAUZE/BANDAGES/DRESSINGS) ×2 IMPLANT
BLADE SURG 10 STRL SS (BLADE) ×1 IMPLANT
BLADE SURG 15 STRL LF DISP TIS (BLADE) ×1 IMPLANT
BLADE SURG 15 STRL SS (BLADE)
CANISTER SUCTION 2500CC (MISCELLANEOUS) ×2 IMPLANT
CLOTH BEACON ORANGE TIMEOUT ST (SAFETY) ×2 IMPLANT
COVER SURGICAL LIGHT HANDLE (MISCELLANEOUS) ×2 IMPLANT
DRAPE LAPAROSCOPIC ABDOMINAL (DRAPES) ×1 IMPLANT
DRAPE PED LAPAROTOMY (DRAPES) IMPLANT
DRAPE PROXIMA HALF (DRAPES) ×1 IMPLANT
DRSG PAD ABDOMINAL 8X10 ST (GAUZE/BANDAGES/DRESSINGS) ×3 IMPLANT
ELECT CAUTERY BLADE 6.4 (BLADE) ×2 IMPLANT
ELECT REM PT RETURN 9FT ADLT (ELECTROSURGICAL) ×2
ELECTRODE REM PT RTRN 9FT ADLT (ELECTROSURGICAL) ×1 IMPLANT
GAUZE PACKING FOLDED 1IN STRL (GAUZE/BANDAGES/DRESSINGS) ×1 IMPLANT
GAUZE PACKING IODOFORM 1/4X5 (PACKING) ×1 IMPLANT
GLOVE BIOGEL PI IND STRL 7.0 (GLOVE) IMPLANT
GLOVE BIOGEL PI INDICATOR 7.0 (GLOVE) ×2
GLOVE SURG SS PI 7.0 STRL IVOR (GLOVE) ×2 IMPLANT
GLOVE SURG SS PI 7.5 STRL IVOR (GLOVE) ×4 IMPLANT
GOWN STRL NON-REIN LRG LVL3 (GOWN DISPOSABLE) ×3 IMPLANT
GOWN STRL REIN XL XLG (GOWN DISPOSABLE) ×2 IMPLANT
KIT BASIN OR (CUSTOM PROCEDURE TRAY) ×2 IMPLANT
KIT ROOM TURNOVER OR (KITS) ×2 IMPLANT
NDL HYPO 25GX1X1/2 BEV (NEEDLE) IMPLANT
NEEDLE HYPO 25GX1X1/2 BEV (NEEDLE) ×2 IMPLANT
NS IRRIG 1000ML POUR BTL (IV SOLUTION) ×2 IMPLANT
PACK GENERAL/GYN (CUSTOM PROCEDURE TRAY) ×2 IMPLANT
PAD ARMBOARD 7.5X6 YLW CONV (MISCELLANEOUS) ×2 IMPLANT
SPONGE GAUZE 4X4 12PLY (GAUZE/BANDAGES/DRESSINGS) ×1 IMPLANT
SPONGE LAP 18X18 X RAY DECT (DISPOSABLE) ×1 IMPLANT
SUT VIC AB 2-0 CT1 27 (SUTURE) ×2
SUT VIC AB 2-0 CT1 TAPERPNT 27 (SUTURE) IMPLANT
SUT VIC AB 3-0 SH 27 (SUTURE) ×2
SUT VIC AB 3-0 SH 27XBRD (SUTURE) IMPLANT
SWAB COLLECTION DEVICE MRSA (MISCELLANEOUS) IMPLANT
SYR BULB 3OZ (MISCELLANEOUS) ×2 IMPLANT
SYR CONTROL 10ML LL (SYRINGE) ×1 IMPLANT
TOWEL OR 17X24 6PK STRL BLUE (TOWEL DISPOSABLE) ×1 IMPLANT
TOWEL OR 17X26 10 PK STRL BLUE (TOWEL DISPOSABLE) ×2 IMPLANT
TUBE ANAEROBIC SPECIMEN COL (MISCELLANEOUS) IMPLANT
TUBE CONNECTING 12X1/4 (SUCTIONS) ×1 IMPLANT
UNDERPAD 30X30 INCONTINENT (UNDERPADS AND DIAPERS) ×1 IMPLANT
YANKAUER SUCT BULB TIP NO VENT (SUCTIONS) ×1 IMPLANT

## 2012-08-10 NOTE — Brief Op Note (Addendum)
07/26/2012 - 08/10/2012  11:32 AM  PATIENT:  [name] [name]  37 y.o. female  PRE-OPERATIVE DIAGNOSIS:  THIGH INFECTION  POST-OPERATIVE DIAGNOSIS:  THIGH INFECTION  PROCEDURE:  Procedure(s): IRRIGATION AND DEBRIDEMENT ABSCESS (Left)  SURGEON:  Surgeon(s) and Role:    * Madilyn Hook, DO - Primary  PHYSICIAN ASSISTANT:   ASSISTANTS: none   ANESTHESIA:   general  EBL:  Total I/O In: 500 [I.V.:500] Out: 450 [Urine:450]  BLOOD ADMINISTERED:none  DRAINS: wound packing   LOCAL MEDICATIONS USED:  MARCAINE    and LIDOCAINE   SPECIMEN:  No Specimen  DISPOSITION OF SPECIMEN:  N/A  COUNTS:  YES  TOURNIQUET:  * No tourniquets in log *  DICTATION: .Other Dictation: Dictation Number dictated, [id number]  PLAN OF CARE: [facility]  PATIENT DISPOSITION:  PACU - hemodynamically stable.   Delay start of Pharmacological VTE agent (>24hrs) due to surgical blood loss or risk of bleeding: no

## 2012-08-10 NOTE — Anesthesia Preprocedure Evaluation (Addendum)
Anesthesia Evaluation  Patient identified by MRN, date of birth, ID band Patient awake    Reviewed: Allergy & Precautions, H&P , NPO status , Patient's Chart, lab work & pertinent test results  Airway Mallampati: I TM Distance: >3 FB Neck ROM: full    Dental  (+) Poor Dentition and Dental Advisory Given,  Upper and lower front teeth ground down:   Pulmonary          Cardiovascular hypertension, Rhythm:regular Rate:Normal     Neuro/Psych  Neuromuscular disease    GI/Hepatic   Endo/Other  diabetes, Type 2, Insulin Dependent  Renal/GU Renal InsufficiencyRenal disease     Musculoskeletal   Abdominal   Peds  Hematology   Anesthesia Other Findings   Reproductive/Obstetrics                          Anesthesia Physical Anesthesia Plan  ASA: III  Anesthesia Plan: General   Post-op Pain Management:    Induction: Intravenous  Airway Management Planned: Oral ETT  Additional Equipment:   Intra-op Plan:   Post-operative Plan: Extubation in OR  Informed Consent: I have reviewed the patients History and Physical, chart, labs and discussed the procedure including the risks, benefits and alternatives for the proposed anesthesia with the patient or authorized representative who has indicated his/her understanding and acceptance.     Plan Discussed with: CRNA, Anesthesiologist and Surgeon  Anesthesia Plan Comments:         Anesthesia Quick Evaluation

## 2012-08-10 NOTE — Anesthesia Procedure Notes (Signed)
Procedure Name: Intubation Date/Time: 08/10/2012 9:48 AM Performed by: Vaughan Browner Pre-anesthesia Checklist: Patient identified, Emergency Drugs available, Suction available and Patient being monitored Patient Re-evaluated:Patient Re-evaluated prior to inductionOxygen Delivery Method: Circle system utilized Preoxygenation: Pre-oxygenation with 100% oxygen Intubation Type: IV induction and Rapid sequence Ventilation: Mask ventilation without difficulty Laryngoscope Size: Mac and 3 Grade View: Grade I Tube type: Oral Tube size: 7.0 mm Number of attempts: 1 Airway Equipment and Method: Stylet and LTA kit utilized Placement Confirmation: ETT inserted through vocal cords under direct vision,  positive ETCO2 and breath sounds checked- equal and bilateral Secured at: 24 cm Tube secured with: Tape Dental Injury: Teeth and Oropharynx as per pre-operative assessment

## 2012-08-10 NOTE — Transfer of Care (Signed)
Immediate Anesthesia Transfer of Care Note  Patient: [name] [name]  Procedure(s) Performed: Procedure(s): IRRIGATION AND DEBRIDEMENT ABSCESS (Left)  Patient Location: PACU  Anesthesia Type:General  Level of Consciousness: awake  Airway & Oxygen Therapy: Patient Spontanous Breathing and Patient connected to nasal cannula oxygen  Post-op Assessment: Report given to PACU RN, Post -op Vital signs reviewed and stable and Patient moving all extremities  Post vital signs: Reviewed and stable  Complications: No apparent anesthesia complications

## 2012-08-10 NOTE — Anesthesia Postprocedure Evaluation (Signed)
  Anesthesia Post-op Note  Patient: [name] [name]  Procedure(s) Performed: Procedure(s): IRRIGATION AND DEBRIDEMENT ABSCESS (Left)  Patient Location: PACU  Anesthesia Type:General  Level of Consciousness: awake, alert , oriented and patient cooperative  Airway and Oxygen Therapy: Patient Spontanous Breathing  Post-op Pain: mild  Post-op Assessment: Post-op Vital signs reviewed, Patient's Cardiovascular Status Stable, Respiratory Function Stable, Patent Airway, No signs of Nausea or vomiting and Pain level controlled  Post-op Vital Signs: stable  Complications: No apparent anesthesia complications

## 2012-08-10 NOTE — Progress Notes (Signed)
See handwritten note.  She is known to our service for prior I/D.  She has and area on her left thigh with induration and cellulitis and continued drainage.  On exam, she has some induration and erythema and fluctuance just under her left thigh wound which connects to the granulation tissue with drainage.  I have recommended I/D of this area.  Risks of infection, bleeding, pain, scarring, recurrence, chronic wound, and incomplete drainage discussed and she would like to proceed with I/D of the left thigh and possibly the pubic region.

## 2012-08-11 LAB — BASIC METABOLIC PANEL
BUN: 33 mg/dL — ABNORMAL HIGH (ref 6–23)
GFR calc Af Amer: 49 mL/min — ABNORMAL LOW (ref >90)
GFR calc non Af Amer: 42 mL/min — ABNORMAL LOW (ref >90)
Potassium: 4.4 mEq/L (ref 3.5–5.1)
Sodium: 139 mEq/L (ref 135–145)

## 2012-08-11 LAB — CBC
Hemoglobin: 7.7 g/dL — ABNORMAL LOW (ref 12.0–15.0)
MCHC: 32.9 g/dL (ref 30.0–36.0)

## 2012-08-11 NOTE — Op Note (Signed)
[name] [name], [name] [name] NO.:  [id number]  MEDICAL RECORD NO.:  [id number]  LOCATION:                                 FACILITY:  PHYSICIAN:  Madilyn Hook, MD       DATE OF BIRTH:  Mar 24, 1976  DATE OF PROCEDURE:  08/10/2012 DATE OF DISCHARGE:                              OPERATIVE REPORT   PROCEDURE:  Incision and drainage of left thigh and suprapubic abscess and left buttock abscess.  PREOPERATIVE DIAGNOSIS:  Left thigh abscess.  POSTOPERATIVE DIAGNOSIS:  Left thigh abscess.  SURGEON:  Madilyn Hook, MD  ASSISTANT:  None.  ANESTHESIA:  General endotracheal anesthesia.  FLUIDS:  Please see anesthesia record.  ESTIMATED BLOOD LOSS:  200 mL.  DRAINS:  Wound packing.  SPECIMENS:  None.  COMPLICATIONS:  None apparent.  FINDINGS:  Purulent cavity in the suprapubic area in the site of prior incision and drainage.  Also, incision and drainage of the left anterior thigh abscess just inferior to her prior wound which was connecting to the granulation tissue of her wounds.  She also had in the left upper posterior thigh and buttock region another I and D site which was inadequately drained and had abscess again.  She also had bleeding in this buttock area which was controlled with a suture ligature and Bovie electrocautery.  All the wounds were packed with packing gauze, soaked in 1% lidocaine with epinephrine and 0.25% Marcaine plain.  INDICATIONS FOR PROCEDURE:  [name] [name] is a 37 year old female, known to our service for prior incision and drainage of the left groin and buttock region abscess.  She was at [facility], getting wound care.  She has had some increasing pain in the left thigh region and increasing drainage.  She had abscess on physical exam.  OPERATIVE DETAILS:  She was seen in [facility] and risks and benefits of procedure were discussed in lay terms.  Informed consent was obtained.  She was already on  antibiotics and taken to the operating room, placed on table in a supine position.  General endotracheal tube anesthesia was obtained and the area was prepped and draped in a standard surgical fashion.  In the area of fluctuance just inferior to her left thigh wound, I made a circular incision over the fluctuant area and encountered the purulent cavity.  This tracked towards the prior wound and the area of drainage in the middle of the granulation tissue and I opened up the exit wound as well and these wounds were connected. I probed the cavity and I tracked it a little bit laterally, but not a significant amount, and I did not see the need to open this area up any further.  I irrigated the wound and inspected it for hemostasis and hemostasis was obtained with electrocautery.  I probed the suprapubic area drainage and this tracked about 4 cm deep, so I opened a little bit wider at the previous incision and irrigated this wound as well and hemostasis was obtained with Bovie electrocautery.  Both of these wounds were packed with packing gauze, 1-inch gauze in the thigh wound which was soaked with 1% lidocaine with epinephrine and 0.25% Marcaine in a 50:50 mixture.  Quarter-inch Nu gauze was used to pack the suprapubic wound and then we retracted the leg and inspected the area of inner upper posterior thigh buttock region, and she had a wound which was draining some purulent material and I probed this wound with my finger, it tracked cephalad and I opened this a little bit further and irrigated the wound, but I did not feel that we need to open this up any wider and it would be easily packed through the current opening.  I did not appreciate any other un-drained fluid collections.  There was some bleeding at the edge of this cavity and this was controlled with suture ligature and Bovie electrocautery.  It was actually a fair amount of bleeding initially and approximately 200 mL before, I was  able to finally stop this bleeding and the wound was then irrigated with sterile saline solution, and it was still noted to be hemostatic and I packed the wound with 1% lidocaine with epinephrine and 0.25% Marcaine plain. The leg was dressed and she was stable and ready for transfer to recovery room in stable condition.          ______________________________ Madilyn Hook, MD     BL/MEDQ  D:  08/10/2012  T:  08/11/2012  Job:  [id number]

## 2012-08-12 LAB — CBC
Hemoglobin: 7.4 g/dL — ABNORMAL LOW (ref 12.0–15.0)
MCH: 26.1 pg (ref 26.0–34.0)
MCHC: 32.6 g/dL (ref 30.0–36.0)
MCV: 80.2 fL (ref 78.0–100.0)
Platelets: 230 10*3/uL (ref 150–400)
RBC: 2.83 MIL/uL — ABNORMAL LOW (ref 3.87–5.11)

## 2012-08-12 LAB — URINALYSIS, ROUTINE W REFLEX MICROSCOPIC
Bilirubin Urine: NEGATIVE
Glucose, UA: NEGATIVE mg/dL
Ketones, ur: NEGATIVE mg/dL
pH: 5.5 (ref 5.0–8.0)

## 2012-08-12 LAB — BASIC METABOLIC PANEL
BUN: 39 mg/dL — ABNORMAL HIGH (ref 6–23)
CO2: 27 mEq/L (ref 19–32)
Calcium: 8.3 mg/dL — ABNORMAL LOW (ref 8.4–10.5)
Glucose, Bld: 51 mg/dL — ABNORMAL LOW (ref 70–99)
Sodium: 135 mEq/L (ref 135–145)

## 2012-08-13 ENCOUNTER — Encounter (HOSPITAL_COMMUNITY): Payer: Self-pay | Admitting: General Surgery

## 2012-08-13 LAB — CBC
HCT: 23.1 % — ABNORMAL LOW (ref 36.0–46.0)
Hemoglobin: 7.7 g/dL — ABNORMAL LOW (ref 12.0–15.0)
MCH: 25.9 pg — ABNORMAL LOW (ref 26.0–34.0)
MCHC: 33.3 g/dL (ref 30.0–36.0)

## 2012-08-13 LAB — BASIC METABOLIC PANEL
BUN: 39 mg/dL — ABNORMAL HIGH (ref 6–23)
Calcium: 8.6 mg/dL (ref 8.4–10.5)
Creatinine, Ser: 1.68 mg/dL — ABNORMAL HIGH (ref 0.50–1.10)
GFR calc non Af Amer: 38 mL/min — ABNORMAL LOW (ref >90)
Glucose, Bld: 69 mg/dL — ABNORMAL LOW (ref 70–99)

## 2012-08-13 MED FILL — Hydromorphone HCl Preservative Free (PF) Inj 1 MG/ML: INTRAMUSCULAR | Qty: 1 | Status: AC

## 2012-08-14 LAB — CBC
HCT: 25 % — ABNORMAL LOW (ref 36.0–46.0)
MCHC: 33.2 g/dL (ref 30.0–36.0)
RDW: 14 % (ref 11.5–15.5)

## 2012-08-14 LAB — BASIC METABOLIC PANEL
BUN: 41 mg/dL — ABNORMAL HIGH (ref 6–23)
Creatinine, Ser: 1.65 mg/dL — ABNORMAL HIGH (ref 0.50–1.10)
GFR calc Af Amer: 45 mL/min — ABNORMAL LOW (ref >90)
GFR calc non Af Amer: 39 mL/min — ABNORMAL LOW (ref >90)

## 2012-08-14 LAB — URINE MICROSCOPIC-ADD ON

## 2012-08-14 LAB — URINALYSIS, ROUTINE W REFLEX MICROSCOPIC
Protein, ur: NEGATIVE mg/dL
Urobilinogen, UA: 0.2 mg/dL (ref 0.0–1.0)

## 2012-08-15 LAB — BASIC METABOLIC PANEL
Calcium: 8.5 mg/dL (ref 8.4–10.5)
Creatinine, Ser: 1.93 mg/dL — ABNORMAL HIGH (ref 0.50–1.10)
GFR calc Af Amer: 37 mL/min — ABNORMAL LOW (ref >90)
GFR calc non Af Amer: 32 mL/min — ABNORMAL LOW (ref >90)

## 2012-08-15 LAB — URINE CULTURE

## 2012-08-15 LAB — CBC
MCH: 26.2 pg (ref 26.0–34.0)
MCHC: 32.9 g/dL (ref 30.0–36.0)
MCV: 79.7 fL (ref 78.0–100.0)
Platelets: 281 10*3/uL (ref 150–400)
RDW: 13.8 % (ref 11.5–15.5)
WBC: 7.7 10*3/uL (ref 4.0–10.5)

## 2012-08-16 ENCOUNTER — Other Ambulatory Visit (HOSPITAL_COMMUNITY): Payer: MEDICAID

## 2012-08-16 LAB — CBC
Platelets: 317 10*3/uL (ref 150–400)
RBC: 3.27 MIL/uL — ABNORMAL LOW (ref 3.87–5.11)
RDW: 14.1 % (ref 11.5–15.5)
WBC: 7.7 10*3/uL (ref 4.0–10.5)

## 2012-08-16 LAB — BASIC METABOLIC PANEL
Calcium: 8.9 mg/dL (ref 8.4–10.5)
Creatinine, Ser: 2.09 mg/dL — ABNORMAL HIGH (ref 0.50–1.10)
GFR calc non Af Amer: 29 mL/min — ABNORMAL LOW (ref >90)
Sodium: 138 mEq/L (ref 135–145)

## 2012-08-16 NOTE — Progress Notes (Signed)
Agree, improving, may D/C foley also Georganna Skeans, MD, MPH, FACS Pager: [phone]

## 2012-08-16 NOTE — Progress Notes (Signed)
6 Days Post-Op  Subjective: Pt doing well, ambulating, pain well controlled.  Pt appears to have lost some significant weight since being in the hospital.  Pt doing well with dressing changes.  Pt tolerating diet.  Objective:  PE: Gen:  Alert, NAD, pleasant Groin wounds:  All appear to be getting smaller in size, there is good beefy red granulation tissue at all sites, no necrotic tissue to debride, no induration or fluctuance, no foul smell or purulent drainage.  Lab Results:   Recent Labs  08/15/12 0520 08/16/12 0500  WBC 7.7 7.7  HGB 7.6* 8.6*  HCT 23.1* 25.9*  PLT 281 317   BMET  Recent Labs  08/15/12 0520 08/16/12 0500  NA 137 138  K 4.2 4.4  CL 104 104  CO2 27 26  GLUCOSE 98 130*  BUN 43* 39*  CREATININE 1.93* 2.09*  CALCIUM 8.5 8.9   PT/INR No results found for this basename: LABPROT, INR,  in the last 72 hours CMP     Component Value Date/Time   NA 138 08/16/2012 0500   K 4.4 08/16/2012 0500   CL 104 08/16/2012 0500   CO2 26 08/16/2012 0500   GLUCOSE 130* 08/16/2012 0500   BUN 39* 08/16/2012 0500   CREATININE 2.09* 08/16/2012 0500   CALCIUM 8.9 08/16/2012 0500   PROT 7.2 08/06/2012 0617   ALBUMIN 1.5* 08/06/2012 0617   AST 26 08/06/2012 0617   ALT 21 08/06/2012 0617   ALKPHOS 200* 08/06/2012 0617   BILITOT 0.2* 08/06/2012 0617   GFRNONAA 29* 08/16/2012 0500   GFRAA 34* 08/16/2012 0500    Assessment/Plan Necrotizing fasciitis of the left mons pubis, left inguinal area, left perineum, left gluteal area, and left medial thigh 1.  Improving, continue BID dressing changes and packing 2.  Pt's wounds appear ready to be discharged home with HH/wound care center if pain is controlled with orals 3.  Pt's urinary catheter can be discontinued - orange urine from pyridium?    LOS: 21 days    [name], [name] Handel 08/16/2012, 9:30 AM Pager: [phone]

## 2012-08-17 LAB — BASIC METABOLIC PANEL
CO2: 28 mEq/L (ref 19–32)
Calcium: 8.7 mg/dL (ref 8.4–10.5)
Chloride: 105 mEq/L (ref 96–112)
GFR calc Af Amer: 31 mL/min — ABNORMAL LOW (ref >90)
Sodium: 139 mEq/L (ref 135–145)

## 2012-08-17 LAB — CBC
MCV: 81.3 fL (ref 78.0–100.0)
Platelets: 239 10*3/uL (ref 150–400)
RBC: 2.84 MIL/uL — ABNORMAL LOW (ref 3.87–5.11)
WBC: 7.4 10*3/uL (ref 4.0–10.5)

## 2012-08-18 LAB — IRON AND TIBC
Saturation Ratios: 17 % — ABNORMAL LOW (ref 20–55)
TIBC: 180 ug/dL — ABNORMAL LOW (ref 250–470)
UIBC: 150 ug/dL (ref 125–400)

## 2012-08-19 LAB — CBC
HCT: 23.5 % — ABNORMAL LOW (ref 36.0–46.0)
MCHC: 31.9 g/dL (ref 30.0–36.0)
Platelets: 200 10*3/uL (ref 150–400)
RDW: 14.2 % (ref 11.5–15.5)
WBC: 6.4 10*3/uL (ref 4.0–10.5)

## 2012-08-19 LAB — BASIC METABOLIC PANEL
BUN: 33 mg/dL — ABNORMAL HIGH (ref 6–23)
Calcium: 8.8 mg/dL (ref 8.4–10.5)
Chloride: 104 mEq/L (ref 96–112)
Creatinine, Ser: 2.5 mg/dL — ABNORMAL HIGH (ref 0.50–1.10)
GFR calc Af Amer: 27 mL/min — ABNORMAL LOW (ref >90)
GFR calc non Af Amer: 24 mL/min — ABNORMAL LOW (ref >90)

## 2012-08-19 LAB — FERRITIN: Ferritin: 341 ng/mL — ABNORMAL HIGH (ref 10–291)

## 2012-08-20 LAB — BASIC METABOLIC PANEL
Calcium: 8.7 mg/dL (ref 8.4–10.5)
GFR calc Af Amer: 20 mL/min — ABNORMAL LOW (ref >90)
GFR calc non Af Amer: 17 mL/min — ABNORMAL LOW (ref >90)
Potassium: 3.7 mEq/L (ref 3.5–5.1)
Sodium: 137 mEq/L (ref 135–145)

## 2012-08-20 LAB — CBC WITH DIFFERENTIAL/PLATELET
Eosinophils Absolute: 0.3 10*3/uL (ref 0.0–0.7)
Hemoglobin: 7.1 g/dL — ABNORMAL LOW (ref 12.0–15.0)
Lymphs Abs: 2.1 10*3/uL (ref 0.7–4.0)
MCH: 25.7 pg — ABNORMAL LOW (ref 26.0–34.0)
Monocytes Relative: 18 % — ABNORMAL HIGH (ref 3–12)
Neutro Abs: 3.3 10*3/uL (ref 1.7–7.7)
Neutrophils Relative %: 48 % (ref 43–77)
Platelets: 186 10*3/uL (ref 150–400)
RBC: 2.76 MIL/uL — ABNORMAL LOW (ref 3.87–5.11)
WBC: 6.9 10*3/uL (ref 4.0–10.5)

## 2012-08-20 LAB — URINALYSIS, ROUTINE W REFLEX MICROSCOPIC
Nitrite: NEGATIVE
Specific Gravity, Urine: 1.017 (ref 1.005–1.030)
Urobilinogen, UA: 0.2 mg/dL (ref 0.0–1.0)

## 2012-08-20 LAB — URINE MICROSCOPIC-ADD ON

## 2012-08-21 LAB — CBC
MCH: 25.9 pg — ABNORMAL LOW (ref 26.0–34.0)
Platelets: 146 10*3/uL — ABNORMAL LOW (ref 150–400)
RBC: 2.66 MIL/uL — ABNORMAL LOW (ref 3.87–5.11)
WBC: 6.1 10*3/uL (ref 4.0–10.5)

## 2012-08-21 LAB — URINE CULTURE

## 2012-08-21 LAB — BASIC METABOLIC PANEL
CO2: 24 mEq/L (ref 19–32)
Calcium: 8.7 mg/dL (ref 8.4–10.5)
Chloride: 104 mEq/L (ref 96–112)
Sodium: 138 mEq/L (ref 135–145)

## 2012-08-21 LAB — RPR: RPR Ser Ql: NONREACTIVE

## 2012-08-21 LAB — PROTEIN, URINE, RANDOM: Total Protein, Urine: 493.5 mg/dL

## 2012-08-21 LAB — ANA: Anti Nuclear Antibody(ANA): NEGATIVE

## 2012-08-21 LAB — MAGNESIUM: Magnesium: 1.4 mg/dL — ABNORMAL LOW (ref 1.5–2.5)

## 2012-08-22 LAB — BASIC METABOLIC PANEL
BUN: 35 mg/dL — ABNORMAL HIGH (ref 6–23)
Calcium: 7.5 mg/dL — ABNORMAL LOW (ref 8.4–10.5)
GFR calc Af Amer: 27 mL/min — ABNORMAL LOW (ref >90)
GFR calc non Af Amer: 23 mL/min — ABNORMAL LOW (ref >90)
Potassium: 3.2 mEq/L — ABNORMAL LOW (ref 3.5–5.1)

## 2012-08-22 LAB — AMMONIA: Ammonia: 16 umol/L (ref 11–60)

## 2012-08-23 LAB — COMPREHENSIVE METABOLIC PANEL
Albumin: 1.8 g/dL — ABNORMAL LOW (ref 3.5–5.2)
BUN: 38 mg/dL — ABNORMAL HIGH (ref 6–23)
Calcium: 8.7 mg/dL (ref 8.4–10.5)
Creatinine, Ser: 2.8 mg/dL — ABNORMAL HIGH (ref 0.50–1.10)
Potassium: 3.8 mEq/L (ref 3.5–5.1)
Total Protein: 7.5 g/dL (ref 6.0–8.3)

## 2012-08-23 LAB — URINE MICROSCOPIC-ADD ON

## 2012-08-23 LAB — RENAL FUNCTION PANEL
Albumin: 2.1 g/dL — ABNORMAL LOW (ref 3.5–5.2)
BUN: 39 mg/dL — ABNORMAL HIGH (ref 6–23)
CO2: 22 mEq/L (ref 19–32)
Chloride: 106 mEq/L (ref 96–112)
Creatinine, Ser: 2.66 mg/dL — ABNORMAL HIGH (ref 0.50–1.10)
GFR calc non Af Amer: 22 mL/min — ABNORMAL LOW (ref >90)
Potassium: 4.2 mEq/L (ref 3.5–5.1)

## 2012-08-23 LAB — URINALYSIS, ROUTINE W REFLEX MICROSCOPIC
Glucose, UA: NEGATIVE mg/dL
Protein, ur: 100 mg/dL — AB
pH: 5 (ref 5.0–8.0)

## 2012-08-23 LAB — CBC
HCT: 20.9 % — ABNORMAL LOW (ref 36.0–46.0)
Hemoglobin: 6.7 g/dL — CL (ref 12.0–15.0)
RBC: 2.6 MIL/uL — ABNORMAL LOW (ref 3.87–5.11)
WBC: 5.6 10*3/uL (ref 4.0–10.5)

## 2012-08-23 LAB — MAGNESIUM: Magnesium: 1.3 mg/dL — ABNORMAL LOW (ref 1.5–2.5)

## 2012-08-23 LAB — RAPID URINE DRUG SCREEN, HOSP PERFORMED
Benzodiazepines: NOT DETECTED
Cocaine: NOT DETECTED

## 2012-08-23 LAB — PREPARE RBC (CROSSMATCH)

## 2012-08-24 ENCOUNTER — Telehealth (INDEPENDENT_AMBULATORY_CARE_PROVIDER_SITE_OTHER): Payer: Self-pay

## 2012-08-24 ENCOUNTER — Encounter: Payer: Self-pay | Admitting: Physician Assistant

## 2012-08-24 DIAGNOSIS — E1310 Other specified diabetes mellitus with ketoacidosis without coma: Secondary | ICD-10-CM

## 2012-08-24 DIAGNOSIS — K219 Gastro-esophageal reflux disease without esophagitis: Secondary | ICD-10-CM

## 2012-08-24 DIAGNOSIS — M726 Necrotizing fasciitis: Secondary | ICD-10-CM

## 2012-08-24 DIAGNOSIS — D649 Anemia, unspecified: Secondary | ICD-10-CM

## 2012-08-24 DIAGNOSIS — D509 Iron deficiency anemia, unspecified: Secondary | ICD-10-CM

## 2012-08-24 LAB — RENAL FUNCTION PANEL
CO2: 26 mEq/L (ref 19–32)
Chloride: 109 mEq/L (ref 96–112)
GFR calc Af Amer: 30 mL/min — ABNORMAL LOW (ref >90)
GFR calc non Af Amer: 25 mL/min — ABNORMAL LOW (ref >90)
Glucose, Bld: 89 mg/dL (ref 70–99)
Potassium: 3.8 mEq/L (ref 3.5–5.1)
Sodium: 142 mEq/L (ref 135–145)

## 2012-08-24 LAB — URINE CULTURE: Culture: NO GROWTH

## 2012-08-24 LAB — CBC WITH DIFFERENTIAL/PLATELET
Hemoglobin: 8.5 g/dL — ABNORMAL LOW (ref 12.0–15.0)
Lymphocytes Relative: 29 % (ref 12–46)
Lymphs Abs: 1.6 10*3/uL (ref 0.7–4.0)
MCH: 26.1 pg (ref 26.0–34.0)
Monocytes Relative: 15 % — ABNORMAL HIGH (ref 3–12)
Neutrophils Relative %: 51 % (ref 43–77)
Platelets: 153 10*3/uL (ref 150–400)
RBC: 3.26 MIL/uL — ABNORMAL LOW (ref 3.87–5.11)
WBC: 5.5 10*3/uL (ref 4.0–10.5)

## 2012-08-24 LAB — MAGNESIUM: Magnesium: 1.8 mg/dL (ref 1.5–2.5)

## 2012-08-24 NOTE — Consult Note (Signed)
bp                                     [facility] Consult: 1:31 PM 08/24/2012   Referring Provider: [facility]  Primary Care Physician:  Trish Fountain, MD Primary Gastroenterologist:  None, unassigned.   Reason for Consultation:  Anemia.   HPI: [name] [name] is a 37 y.o. female.  Obese, insulin dependent type 2 DM.  Bacterial endocarditis in 2002.   Long hx of various skin/soft tissue infections, MRSA, abcesses, cellulitis (vulvar, neck, buttock, thighs, abdominal wall, peri-rectal) with multiple I & Ds, dating back to at least 1998.   Admitted 2/14 - 07/26/12 with necrotizing fascitis in left groin requiring multiple I & Ds/debridements, surgical dressing changes under anesthesia.  No abx at discharge. Hgb A1c of 15.4!!!!  Developed acute renal failure/ATN from Vancomycin (?) vs Lisinopril and Metformin. Creatinine 1.6 at discharge to [facility].  Ongoing requirements for narcotic analgesia, wound hydrotherapy.  Baseline Hgb ~ 10.0 dropping to nadir of 6.9, 7.1 at discharge.  Required 3 units PRBCs.  She was FOB positive on 07/15/12 Reoperated with I & D of left thigh on 3/21.   Since transfer to [facility] on 3/6 has received 4 units of PRBCs for ongoing Anemia and dropping Hgb. Recent Nadir of 6.7 on 4/3, transfused one unit to 8.5 today.  MCV normal.  Other noteable Hgb is 6.4 on 3/15.  Reached as high as 10.3 on 3/11, though this may be spurious as it is an outlier.  Reticulocytes on 07/22/12 was low at 3.16, it was also low in 07/2010 at 3.7 TSH on 07/29/12 was elevated at 29.017.  I do not see Synthroid/ thyroid replacement on her current or previous med list  She has had gross hematuria several days ago and U/A on 3/31 showed TNTC RBCs, many bacteria. Urine clx was negative.  Recheck U/A on 4/3 shows 21-50 RBCs.   Has 1 to 3 BMs daily, never bloody or melenic.  Does get frequent heartbutn at home, controlled variably with antacids and OTC h2 blockers.  Never had PPI RXd, never  had EGD.  No dysphagia.  Tends to have nausea in AM, sometimes vomits non-bloody material if she eats to soon in AM, later in AM and rest of day:  No N/V. She denies heavy menstrual bleeding and is unclear as to last menses, thinks it was earlier this month.  Says period generally regular.  No unusual excessive bleeding problems in past  Incidental note is made of right atrial mass c/w Chiari network and elevated right heart pressures on 07/31/12 2D Echo.     Past Medical History  Diagnosis Date  . Diabetes mellitus     Type 2  . Hyperlipidemia   . Hypertension   . Anxiety   . Depression   . Abscess     history of multiple abscesses  . Anemia 2002    of chronic disease  . Obesity   . Peripheral neuropathy   . Abnormal Pap smear of cervix 2009  . Endocarditis 2002    subacute bacterial endocarditis.   . OSA on CPAP     non-compliant with home CPAP  . Edema of lower extremity     Past Surgical History  Procedure Laterality Date  . Incision and drainage abscess      multiple I&Ds  . Irrigation and debridement abscess Left 07/06/2012  Procedure: IRRIGATION AND DEBRIDEMENT ABSCESS BUTTOCKS AND THIGH;  Surgeon: Shann Medal, MD;  Location: [place];  Service: General;  Laterality: Left;  . Incision and drainage of wound Left 07/07/2012    Procedure: IRRIGATION AND DEBRIDEMENT WOUND;  Surgeon: Harl Bowie, MD;  Location: [place];  Service: General;  Laterality: Left;  . Incision and drainage abscess Left 07/09/2012    Procedure: DRESSING CHANGE, THIGH WOUND;  Surgeon: Harl Bowie, MD;  Location: [place];  Service: General;  Laterality: Left;  . Incision and drainage perirectal abscess Left 07/14/2012    Procedure: DEBRIDEMENT OF SKIN & SOFT TISSUE; DRESSING CHANGE UNDER ANESTHESIA;  Surgeon: Gayland Curry, MD,FACS;  Location: [place];  Service: General;  Laterality: Left;  . Incision and drainage perirectal abscess Left 07/16/2012    Procedure: I&D Left Thigh;  Surgeon: Gwenyth Ober, MD;  Location: [place];  Service: General;  Laterality: Left;  . Irrigation and debridement abscess Left 08/10/2012    Procedure: IRRIGATION AND DEBRIDEMENT ABSCESS;  Surgeon: Madilyn Hook, DO;  Location: [facility];  Service: General;  Laterality: Left;  . Tonsillectomy  age 42    Prior to Admission medications   Medication Sig Start Date End Date Taking? Authorizing Provider  amitriptyline (ELAVIL) 50 MG tablet Take 1 tablet (50 mg total) by mouth at bedtime. 02/20/12 02/19/13  Trish Fountain, MD  collagenase (SANTYL) ointment Apply topically daily. 07/26/12   Emory Carollee Leitz, MD  FLUoxetine (PROZAC) 40 MG capsule Take 1 capsule (40 mg total) by mouth daily. 09/02/11   Trish Fountain, MD  glipiZIDE (GLUCOTROL) 10 MG tablet Take 1 tablet (10 mg total) by mouth 2 (two) times daily before a meal. 02/20/12 02/19/13  Trish Fountain, MD  HYDROcodone-acetaminophen (NORCO/VICODIN) 5-325 MG per tablet Take 1-2 tablets by mouth every 4 (four) hours. 07/26/12   Emory Carollee Leitz, MD  insulin aspart (NOVOLOG) 100 UNIT/ML injection Inject 0-5 Units into the skin at bedtime. 07/26/12   Emory Carollee Leitz, MD  insulin aspart (NOVOLOG) 100 UNIT/ML injection Inject 0-15 Units into the skin 3 (three) times daily with meals. 07/26/12   Emory Carollee Leitz, MD  insulin aspart (NOVOLOG) 100 UNIT/ML injection Inject 4 Units into the skin 3 (three) times daily with meals. 07/26/12   Emory Carollee Leitz, MD  insulin glargine (LANTUS) 100 UNIT/ML injection Inject 15 Units into the skin at bedtime. 07/26/12   Emory Carollee Leitz, MD  miconazole (MONISTAT 7) 2 % vaginal cream Place 1 applicator vaginally at bedtime.    Historical Provider, MD  pravastatin (PRAVACHOL) 20 MG tablet Take 1 tablet (20 mg total) by mouth daily. 01/04/12   Trish Fountain, MD    Meds:  Novolog SSI   Lantus insulin  Divalproex Doxycycline Fluoxetine Dilaudid PRN     Oxycodone prn Lamotrigine Vaginal Miconazol Miiralax once daily Protonix 40 mg once  daily  Pravastatin Pro stat nutrition supplement Flora Q Vitamin C  Zinc po    Allergies as of 07/26/2012  . Vancomycin.  Suspected to have caused ARF    Family History  Problem Relation Age of Onset  . Heart failure Mother   . Diabetes Mother   . Kidney disease Mother   . Kidney disease Father        Anemia requiring transfusions in diabetic mother.        Crohn's disease in aunt and a cousin.        PUD in a cousin.   History  Social History  . Marital Status: Single    Spouse Name: N/A    Number of Children: N/A  . Years of Education: Repeated grades 6 through 8.  Finally aged out of the 12th grade at age 69.    Occupational History  . telemarketer     Phone surveys   Social History Main Topics  . Smoking status: Former Smoker -- 1.00 packs/day  . Smokeless tobacco: Not on file  . Alcohol Use: Yes     Comment: ocassional  . Drug Use: No  . Sexually Active: Not on file    Social History Narrative   Lives with mother currently, She can read and perform simple arithmetic.     REVIEW OF SYSTEMS: Constitutional:  Extremely weak, not this weak at home PTA ENT:  No nose bleeds.  Voice is hoarse, not so at home Pulm:  Some clear productive cough CV:  No palpitations, no chest pain.  Chronic LE swelling GU:  Has not previously seen hematuria.   Gyn:  Regular periods, thinks last period was in early 07/2010 GI:  Per HPI Heme:  No records of her taking po iron  Transfusions:    No previous transfusions. MS:  Hx knee pain.  Neuro:  No headache, no syncope Derm:  Per HPI Endocrine:  Per HPI Immunization:  Pneumococcal vaccine 12/2011.  Not clear if up to date flu shot Travel:  none   PHYSICAL EXAM: Vital signs in last 24 hours:  BP  157/94  Pulse 98  resp 18  sats 97%  Temp 97.4  General: morbidly obese AAF who is lethargic.  Looks in poor general health and is deconditioned.  Head:  cushingoid  Eyes:  No icterus or conj pallor Ears:  ? HOH vs slow  response time/lethargy  Nose:  No discharge Mouth:  Dry lips Voice:  Hoarse and weak.  Neck:  Obese, no mass or JVD. Lungs:  Diminished but clear.  Dyspneic with trying to sit up and down in bed Heart: RRR with soft systolic murmer Abdomen:  Obese, soft, not tender, no mass, no bruits.  Active BS.   Rectal: brown stool smeared on buttocks and into back of thigh wound.  It is brown and FOB negative.  No rectal ulcers.  Did not do digital ezam.   Musc/Skeltl: weak, debilitated, can not move in out of bed without a lot of help Extremities:  Massive LE woody edema.  Top of left thigh wound covered in front with sealed bandage.  Neurologic:  Slow response time.  Hx recall is questionalble though oriented x 3.  Skin:  Peeling on feet.  Hyperpigmented spots all over body  (? Old pustule scars?) Tattoos:  none Nodes:  No cervical adenopathy.    Psych:  Cooperative, flat affect  Intake/Output from previous day:   Intake/Output this shift:    LAB RESULTS:   Ref. Range 07/15/2012 16:36 08/06/2012 15:09 08/23/2012 08:11  Fecal Occult Blood, POC Latest Range: NEGATIVE  POSITIVE (A) NEGATIVE NEGATIVE    Recent Labs  08/23/12 0405 08/24/12 0610  WBC 5.6 5.5  HGB 6.7* 8.5*  HCT 20.9* 26.1*  PLT 141* 153  MCV    80  Differential   Ref. Range 08/24/2012 06:10  Neutrophils Relative Latest Range: 43-77 % 51  Lymphocytes Relative Latest Range: 12-46 % 29  Monocytes Relative Latest Range: 3-12 % 15 (H)  Eosinophils Relative Latest Range: 0-5 % 4  Basophils Relative Latest Range: 0-1 % 1  NEUT#  Latest Range: 1.7-7.7 K/uL 2.8  Lymphocytes Absolute Latest Range: 0.7-4.0 K/uL 1.6  Monocytes Absolute Latest Range: 0.1-1.0 K/uL 0.8  Eosinophils Absolute Latest Range: 0.0-0.7 K/uL 0.2  Basophils Absolute Latest Range: 0.0-0.1 K/uL 0.0    Haptoglobin      376      On 07/15/12  (rr is 45 - 215)   Ref. Range 07/28/2012 14:00 08/07/2012 07:20 08/18/2012 15:59 08/19/2012 05:00  Iron Latest Range: 42-135  ug/dL 15 (L)  30 (L)   UIBC Latest Range: 125-400 ug/dL 193  150   TIBC Latest Range: 250-470 ug/dL 208 (L)  180 (L)   Saturation Ratios Latest Range: 20-55 % 7 (L)  17 (L)   Ferritin Latest Range: 10-291 ng/mL 288 606 (H)  341 (H)  B12                                           693           BMET Lab Results  Component Value Date   NA 142 08/24/2012   NA 139 08/23/2012   NA 140 08/23/2012   K 3.8 08/24/2012   K 4.2 08/23/2012   K 3.8 08/23/2012   CL 109 08/24/2012   CL 106 08/23/2012   CL 108 08/23/2012   CO2 26 08/24/2012   CO2 22 08/23/2012   CO2 24 08/23/2012   GLUCOSE 89 08/24/2012   GLUCOSE 94 08/23/2012   GLUCOSE 108* 08/23/2012   BUN 35* 08/24/2012   BUN 39* 08/23/2012   BUN 38* 08/23/2012   CREATININE 2.35* 08/24/2012   CREATININE 2.66* 08/23/2012   CREATININE 2.80* 08/23/2012   CALCIUM 9.0 08/24/2012   CALCIUM 9.1 08/23/2012   CALCIUM 8.7 08/23/2012   LFT  Recent Labs  08/23/12 0405 08/23/12 1713 08/24/12 0610  PROT 7.5  --   --   ALBUMIN 1.8* 2.1* 1.8*  AST 7  --   --   ALT <5  --   --   ALKPHOS 64  --   --   BILITOT 0.1*  --   --    PT/INR Lab Results  Component Value Date   INR 1.13 07/29/2012    ENDOSCOPIC STUDIES: None in Epic.   IMPRESSION: *  Chronic, transfusion requiring anemia.  Was FOB + once last month and heme negative thrice since then (including my stool FOB today), but never has had overt Melena, hematochezia, or hematemesis. Note that she has low reticulocyte count, which if anything ought to be elevated in setting of this degree and duration of anemia. Haptoglobin is elevated which rules out hemolytic anemia.  Suspect bone marrow dysfunction secondary to chronic and acute infection and disease, the most prominent of which is very poorly controlled DM and acute, recurrent fascitis/soft tissue infections requiring multiple surgical interventions.  *  Hematuria, also contributing to anemia. *  Hypothyroidism.  Not addressed as not on any replacement therapy.  This certainly can  cause anemia.  *  IDDM, many years of poor control with Hgb A1c ranges equivalent to serum glucoses never below 324. Sugars recently better controlled *  GERD by hx, sxs controlled now with PPI *  AM nausea and vomiting.  Very likely to have diabetic gastriparesis *  OSA, CPAP in use now, not compliant with this at home.  *  Renal insufficiency.  following ARF due to Vancomycin.  PLAN: *  ? hematology eval, but they do not carry priviledges at [facility].  *  TSH, free t4, t3 levels and start replacement hormone if indicated *  No role for EGD/flex sig or colonoscopy unless she has overt GI bleeding or persistent occult blood in stool *  Continue Protonix for GERD.   LOS: 29 days   Azucena Freed  08/24/2012, 1:31 PM Pager: [phone]     I have taken a history, examined the patient and reviewed the chart. I agree with the Advanced Practitioner's note, impression and recommendations.   Ladene Artist MD [facility]

## 2012-08-24 NOTE — Telephone Encounter (Signed)
V/M appt with Dr. Lucia Gaskins 09/19/12@10 :50am advised to call to confirm

## 2012-08-25 LAB — T4, FREE: Free T4: 0.8 ng/dL (ref 0.80–1.80)

## 2012-08-25 LAB — BASIC METABOLIC PANEL
BUN: 35 mg/dL — ABNORMAL HIGH (ref 6–23)
Chloride: 107 mEq/L (ref 96–112)
GFR calc non Af Amer: 24 mL/min — ABNORMAL LOW (ref >90)
Glucose, Bld: 84 mg/dL (ref 70–99)
Potassium: 3.7 mEq/L (ref 3.5–5.1)
Sodium: 141 mEq/L (ref 135–145)

## 2012-08-25 LAB — CBC
HCT: 22.6 % — ABNORMAL LOW (ref 36.0–46.0)
Hemoglobin: 7.4 g/dL — ABNORMAL LOW (ref 12.0–15.0)
MCHC: 32.7 g/dL (ref 30.0–36.0)
RBC: 2.81 MIL/uL — ABNORMAL LOW (ref 3.87–5.11)
WBC: 6 10*3/uL (ref 4.0–10.5)

## 2012-08-25 LAB — T3, FREE: T3, Free: 2.2 pg/mL — ABNORMAL LOW (ref 2.3–4.2)

## 2012-08-26 LAB — CBC
Hemoglobin: 7 g/dL — ABNORMAL LOW (ref 12.0–15.0)
MCHC: 32.9 g/dL (ref 30.0–36.0)
RDW: 14.8 % (ref 11.5–15.5)
WBC: 5.4 10*3/uL (ref 4.0–10.5)

## 2012-08-26 LAB — BASIC METABOLIC PANEL
Chloride: 106 mEq/L (ref 96–112)
GFR calc Af Amer: 35 mL/min — ABNORMAL LOW (ref >90)
GFR calc non Af Amer: 30 mL/min — ABNORMAL LOW (ref >90)
Glucose, Bld: 97 mg/dL (ref 70–99)
Potassium: 3.5 mEq/L (ref 3.5–5.1)
Sodium: 140 mEq/L (ref 135–145)

## 2012-08-26 LAB — TYPE AND SCREEN
ABO/RH(D): B POS
Unit division: 0

## 2012-08-27 LAB — COMPREHENSIVE METABOLIC PANEL
Albumin: 2.6 g/dL — ABNORMAL LOW (ref 3.5–5.2)
Alkaline Phosphatase: 65 U/L (ref 39–117)
BUN: 26 mg/dL — ABNORMAL HIGH (ref 6–23)
CO2: 28 mEq/L (ref 19–32)
Chloride: 100 mEq/L (ref 96–112)
GFR calc Af Amer: 41 mL/min — ABNORMAL LOW (ref >90)
Glucose, Bld: 174 mg/dL — ABNORMAL HIGH (ref 70–99)
Potassium: 3.4 mEq/L — ABNORMAL LOW (ref 3.5–5.1)
Total Bilirubin: 0.4 mg/dL (ref 0.3–1.2)

## 2012-08-27 LAB — PREPARE RBC (CROSSMATCH)

## 2012-08-27 LAB — CBC
HCT: 30.8 % — ABNORMAL LOW (ref 36.0–46.0)
Hemoglobin: 10.4 g/dL — ABNORMAL LOW (ref 12.0–15.0)
RBC: 3.93 MIL/uL (ref 3.87–5.11)
WBC: 7.4 10*3/uL (ref 4.0–10.5)

## 2012-08-28 LAB — CBC WITH DIFFERENTIAL/PLATELET
Basophils Absolute: 0.1 10*3/uL (ref 0.0–0.1)
Eosinophils Absolute: 0.1 10*3/uL (ref 0.0–0.7)
Eosinophils Relative: 1 % (ref 0–5)
Lymphocytes Relative: 38 % (ref 12–46)
MCV: 80.8 fL (ref 78.0–100.0)
Platelets: 126 10*3/uL — ABNORMAL LOW (ref 150–400)
RDW: 14.8 % (ref 11.5–15.5)
WBC: 8.2 10*3/uL (ref 4.0–10.5)

## 2012-08-28 LAB — COMPREHENSIVE METABOLIC PANEL
ALT: <5 U/L (ref 0–35)
AST: 7 U/L (ref 0–37)
CO2: 34 mEq/L — ABNORMAL HIGH (ref 19–32)
Calcium: 9.1 mg/dL (ref 8.4–10.5)
Sodium: 141 mEq/L (ref 135–145)
Total Protein: 8.3 g/dL (ref 6.0–8.3)

## 2012-08-28 LAB — TYPE AND SCREEN: Unit division: 0

## 2012-08-29 LAB — CBC
HCT: 26.9 % — ABNORMAL LOW (ref 36.0–46.0)
Hemoglobin: 9 g/dL — ABNORMAL LOW (ref 12.0–15.0)
MCH: 26.9 pg (ref 26.0–34.0)
MCHC: 33.5 g/dL (ref 30.0–36.0)
MCV: 80.3 fL (ref 78.0–100.0)

## 2012-08-29 LAB — BASIC METABOLIC PANEL
BUN: 41 mg/dL — ABNORMAL HIGH (ref 6–23)
Calcium: 9 mg/dL (ref 8.4–10.5)
GFR calc non Af Amer: 24 mL/min — ABNORMAL LOW (ref >90)
Glucose, Bld: 107 mg/dL — ABNORMAL HIGH (ref 70–99)

## 2012-08-30 LAB — CBC WITH DIFFERENTIAL/PLATELET
Basophils Absolute: 0.1 10*3/uL (ref 0.0–0.1)
Eosinophils Absolute: 0.3 10*3/uL (ref 0.0–0.7)
Eosinophils Relative: 4 % (ref 0–5)
Lymphs Abs: 2.2 10*3/uL (ref 0.7–4.0)
MCH: 26.7 pg (ref 26.0–34.0)
MCV: 79.9 fL (ref 78.0–100.0)
Monocytes Absolute: 1.3 10*3/uL — ABNORMAL HIGH (ref 0.1–1.0)
Platelets: 96 10*3/uL — ABNORMAL LOW (ref 150–400)
RDW: 14.4 % (ref 11.5–15.5)

## 2012-08-30 LAB — BASIC METABOLIC PANEL
Calcium: 9 mg/dL (ref 8.4–10.5)
Creatinine, Ser: 2 mg/dL — ABNORMAL HIGH (ref 0.50–1.10)
GFR calc non Af Amer: 31 mL/min — ABNORMAL LOW (ref >90)
Glucose, Bld: 117 mg/dL — ABNORMAL HIGH (ref 70–99)
Sodium: 136 mEq/L (ref 135–145)

## 2012-09-02 LAB — CBC
MCHC: 33.3 g/dL (ref 30.0–36.0)
MCV: 80.4 fL (ref 78.0–100.0)
Platelets: 102 10*3/uL — ABNORMAL LOW (ref 150–400)
RDW: 14.5 % (ref 11.5–15.5)
WBC: 6.3 10*3/uL (ref 4.0–10.5)

## 2012-09-02 LAB — COMPREHENSIVE METABOLIC PANEL
AST: 11 U/L (ref 0–37)
Albumin: 2.6 g/dL — ABNORMAL LOW (ref 3.5–5.2)
BUN: 24 mg/dL — ABNORMAL HIGH (ref 6–23)
Calcium: 9.2 mg/dL (ref 8.4–10.5)
Chloride: 99 mEq/L (ref 96–112)
Creatinine, Ser: 1.42 mg/dL — ABNORMAL HIGH (ref 0.50–1.10)
Total Protein: 8.7 g/dL — ABNORMAL HIGH (ref 6.0–8.3)

## 2012-09-06 LAB — HEPATITIS C ANTIBODY (REFLEX): HCV Ab: NEGATIVE

## 2012-09-06 LAB — HIV ANTIBODY (ROUTINE TESTING W REFLEX): HIV: NONREACTIVE

## 2012-09-06 LAB — HEPATITIS B SURFACE ANTIGEN: Hepatitis B Surface Ag: NEGATIVE

## 2012-09-07 LAB — CBC
Hemoglobin: 9.8 g/dL — ABNORMAL LOW (ref 12.0–15.0)
Platelets: 193 10*3/uL (ref 150–400)
RBC: 3.65 MIL/uL — ABNORMAL LOW (ref 3.87–5.11)
WBC: 6 10*3/uL (ref 4.0–10.5)

## 2012-09-07 LAB — BASIC METABOLIC PANEL
CO2: 26 mEq/L (ref 19–32)
Calcium: 9.2 mg/dL (ref 8.4–10.5)
Glucose, Bld: 179 mg/dL — ABNORMAL HIGH (ref 70–99)
Potassium: 4.3 mEq/L (ref 3.5–5.1)
Sodium: 137 mEq/L (ref 135–145)

## 2012-09-09 LAB — BASIC METABOLIC PANEL
BUN: 22 mg/dL (ref 6–23)
Creatinine, Ser: 1.12 mg/dL — ABNORMAL HIGH (ref 0.50–1.10)
GFR calc Af Amer: 72 mL/min — ABNORMAL LOW (ref >90)
GFR calc non Af Amer: 62 mL/min — ABNORMAL LOW (ref >90)
Potassium: 4.5 mEq/L (ref 3.5–5.1)

## 2012-09-09 LAB — CBC
HCT: 26.4 % — ABNORMAL LOW (ref 36.0–46.0)
MCHC: 33.7 g/dL (ref 30.0–36.0)
Platelets: 219 10*3/uL (ref 150–400)
RDW: 14.8 % (ref 11.5–15.5)
WBC: 6.5 10*3/uL (ref 4.0–10.5)

## 2012-09-11 LAB — RENAL FUNCTION PANEL
Albumin: 2.6 g/dL — ABNORMAL LOW (ref 3.5–5.2)
Calcium: 9.5 mg/dL (ref 8.4–10.5)
Creatinine, Ser: 1.13 mg/dL — ABNORMAL HIGH (ref 0.50–1.10)
GFR calc Af Amer: 72 mL/min — ABNORMAL LOW (ref >90)
GFR calc non Af Amer: 62 mL/min — ABNORMAL LOW (ref >90)
Sodium: 138 mEq/L (ref 135–145)

## 2012-09-11 LAB — CBC
MCH: 26.7 pg (ref 26.0–34.0)
MCHC: 33.6 g/dL (ref 30.0–36.0)
Platelets: 217 10*3/uL (ref 150–400)
RDW: 14.9 % (ref 11.5–15.5)

## 2012-09-13 LAB — CBC
RDW: 15.3 % (ref 11.5–15.5)
WBC: 5.4 10*3/uL (ref 4.0–10.5)

## 2012-09-13 LAB — RENAL FUNCTION PANEL
CO2: 26 mEq/L (ref 19–32)
Calcium: 9.3 mg/dL (ref 8.4–10.5)
Chloride: 107 mEq/L (ref 96–112)
Creatinine, Ser: 1.09 mg/dL (ref 0.50–1.10)
Glucose, Bld: 95 mg/dL (ref 70–99)

## 2012-09-19 ENCOUNTER — Ambulatory Visit (INDEPENDENT_AMBULATORY_CARE_PROVIDER_SITE_OTHER): Payer: Self-pay | Admitting: Surgery

## 2012-09-19 ENCOUNTER — Encounter (INDEPENDENT_AMBULATORY_CARE_PROVIDER_SITE_OTHER): Payer: Self-pay | Admitting: Surgery

## 2012-09-19 VITALS — BP 122/84 | HR 72 | Temp 97.9°F | Resp 16 | Ht 70.0 in | Wt 273.4 lb

## 2012-09-19 DIAGNOSIS — L0293 Carbuncle, unspecified: Secondary | ICD-10-CM

## 2012-09-19 DIAGNOSIS — M726 Necrotizing fasciitis: Secondary | ICD-10-CM

## 2012-09-19 NOTE — Progress Notes (Signed)
Har[place], MD,  [address]  [place], [place]    [place] Phone:  [phone] FAX:  [phone]   Re:   [name] [name] DOB:   09/03/1975 MRN:   [id number]  ASSESSMENT AND PLAN: 1. Left thigh/perineal necrosis/abscess   Debrided by D. Elsworth Ledin - 07/06/2012 and by D. Blackman - 07/07/2012   Wound clean and healing.  She can go to once a day dressing change.  She will check with me in 6 weeks.  2. Poorly controlled diabetes - now doing better  HgbA1C - 15.4 - 07/07/2012   Most recent glucose - 126 - 09/09/2012 3. Anemia   Hgb - 7.5 - 07/08/2012   Now - 8.6 - 09/13/2012 still probably due to nature of illness 4. Acute renal failure  Now Creat back to normal - 1.09 - 09/13/2012 5. Morbid obesity  - she's lost about 40 pounds since this began  HISTORY OF PRESENT ILLNESS: Chief Complaint  Patient presents with  . Follow-up    wound check - lt thigh   [name] [name] is a 37 y.o. (DOB: [birth year])  AA  female who is a patient of PRIBULA,CHRISTOPHER, MD and comes to me today for follow up a left thigh/perineal abscess/fasciitis.  She is doing much better than when I last saw her.  She was followed by a lot of partners on the [facility].  She was actually seen by Drs. Streck and Walcott for episodes that preceded this most recent event. But she is doing much better.  She is to see Dr. Obie Dredge in the medicine clinic.  This should help a lot with her diabetes management.  Social History: Her cousin, Barnabas Lister, is with her.  PHYSICAL EXAM: BP 122/84  Pulse 72  Temp(Src) 97.9 F (36.6 C) (Temporal)  Resp 16  Ht 5\' 10"  (1.778 m)  Wt 273 lb 6.4 oz (124.013 kg)  BMI 39.23 kg/m2  Left thigh and perineum:  One larger healing wound 15 x 1.5 cm with good granulation tissue.  There are surrounding wounds which are smaller and clean.  [I took a Clinical research associate of the wounds.]  DATA REVIEWED: Epic notes.  Alphonsa Overall, MD, [place] Office:   [phone]

## 2012-09-20 ENCOUNTER — Ambulatory Visit: Payer: Self-pay

## 2012-09-26 NOTE — Assessment & Plan Note (Signed)
She was able to fill and start the Gabapentin and states that it has been helping with the pain in her legs and hands.  She states that it sometimes does make her more sleepy but that she is able to tolerate it. She has not started the amitriptyline.  We will start that today to help with her pain as well as to help her sleep which contributes to her depressed mood as well.

## 2012-09-26 NOTE — Assessment & Plan Note (Signed)
She states that she still feels down most days.  She has fleeting thoughts of "why am I here" and struggles with those.  She does not have any thoughts of hurting herself or others.  She continues to be very tearful during the interview today and several times says that she feels like giving up.  She has gotten and started the Prozac and feels that it helps some but has not made her significantly better yet.  She has not been to mental health in sometime and we discussed today getting back to [facility] to see if they can help her with medications as well as counseling for her depression.  She states that her "sleep is horrible."  She states that she sometimes does not get to sleep until about 2 am and then is up at 5 or 6 am.  She states that she "just stares at the ceiling, crying" most nights.    We will continue the prozac for now as well as restart her amitriptyline to help her rest as well as augment her treatment for her neuropathy.

## 2012-09-26 NOTE — Assessment & Plan Note (Signed)
BP Readings from Last 3 Encounters:  02/20/12 142/93  01/02/12 139/93  10/14/11 156/108   Blood pressure today is mildly above her goal of <140/80.  She has been taking her lisinopril "hit or miss" over the last few weeks.  She states that she will occasionally go a day or two without taking it and then have several days in a row where she will take it every day.    We discussed today making sure that she takes her medications every day and ideas of how she can get in the habit including pill boxes as well as setting an alarm on her phone to remind her.  Her mood is interferes with her compliance as she has some days where she doesn't get out of bed because of her depression.

## 2012-09-26 NOTE — Assessment & Plan Note (Signed)
Lab Results  Component Value Date   HGBA1C 10.5 01/02/2012   HGBA1C 12.0 09/02/2011   Lab Results  Component Value Date   MICROALBUR 38.54 Result confirmed by automatic dilution.* 03/21/2010   LDLCALC 104* 04/12/2011   She states that she has picked up the metformin and glipizide from [employer] and has been taking them every day.  She does not have a meter that she can use but today in the office her CBG was 185 which is improved.  She will be due for a recheck of her A1C in November.

## 2012-10-04 ENCOUNTER — Ambulatory Visit (HOSPITAL_BASED_OUTPATIENT_CLINIC_OR_DEPARTMENT_OTHER): Payer: Self-pay | Attending: Physician Assistant | Admitting: Radiology

## 2012-10-04 VITALS — Ht 70.0 in | Wt 270.0 lb

## 2012-10-04 DIAGNOSIS — G4733 Obstructive sleep apnea (adult) (pediatric): Secondary | ICD-10-CM | POA: Insufficient documentation

## 2012-10-06 DIAGNOSIS — R0989 Other specified symptoms and signs involving the circulatory and respiratory systems: Secondary | ICD-10-CM

## 2012-10-06 DIAGNOSIS — R0609 Other forms of dyspnea: Secondary | ICD-10-CM

## 2012-10-06 DIAGNOSIS — G4733 Obstructive sleep apnea (adult) (pediatric): Secondary | ICD-10-CM

## 2012-10-06 NOTE — Procedures (Signed)
[name] [name], [name] [name]          ACCOUNT NO.:  [id number]  MEDICAL RECORD NO.:  [id number]          PATIENT TYPE:  OUT  LOCATION:  SLEEP CENTER                 FACILITY:  [facility]  PHYSICIAN:  Clinton D. Annamaria Boots, MD, FCCP, FACPDATE OF BIRTH:  12/18/76  DATE OF STUDY:  10/04/2012                           NOCTURNAL POLYSOMNOGRAM  REFERRING PHYSICIAN:  MICHELLE SCHNOLL  INDICATION FOR STUDY:  Hypersomnia with sleep apnea.  EPWORTH SLEEPINESS SCORE:  17/24.  BMI 38.7, weight 270 pounds, height 70 inches, neck 15 inches.  MEDICATIONS:  Home medications are charted and reviewed.  SLEEP ARCHITECTURE:  Split study protocol.  During the diagnostic phase, total sleep time 119.5 minutes with sleep efficiency 72.9%.  Stage I was 13.8%, stage II 79.9%, stage III absent, REM 6.3% of total sleep time. Sleep latency 45 minutes, REM latency 66 minutes.  Awake after sleep onset 2 minutes.  Arousal index 21.6.  Bedtime medication:  None.  RESPIRATORY DATA:  Split study protocol.  Apnea hypopnea index (AHI) 55.2 per hour.  A total of 110 events was scored including 7 obstructive apneas, 3 central apneas, 100 hypopneas.  Events were seen in all sleep positions, especially while supine.  REM AHI 64 per hour.  CPAP was titrated to 8 CWP, AHI 1 per hour.  She wore a medium ResMed Quattro FX full-face mask with heated humidifier.  OXYGEN DATA:  Before CPAP snoring was moderate with oxygen desaturation to a nadir of 81% on room air.  With CPAP control, snoring was prevented and mean oxygen saturation held 95.8% on room air.  CARDIAC DATA:  Normal sinus rhythm.  MOVEMENT-PARASOMNIA:  No significant movement disturbance during the diagnostic phase.  During the titration phase, a total of 53 limb jerks were counted, of which 9 were associated with arousals or awakenings for periodic limb movement with arousal index of 2.5 per hour.  No bathroom trips.  IMPRESSIONS-RECOMMENDATIONS: 1. Severe  obstructive sleep apnea/hypopnea syndrome, AHI 55.2 per hour     with non-positional events.  Moderate snoring with oxygen     desaturation to a nadir of 81% on room air. 2. Successful CPAP titration to 8 CWP, AHI 1 per hour.  She wore a     medium ResMed Quattro FX full-face mask with heated humidifier.     Snoring was prevented and mean oxygen saturation of 95.8% on room     air.     Clinton D. Annamaria Boots, MD, [facility], [place], [place] Board of Sleep Medicine    CDY/MEDQ  D:  10/06/2012 10:52:14  T:  10/06/2012 15:07:02  Job:  [id number]

## 2012-11-08 ENCOUNTER — Encounter (INDEPENDENT_AMBULATORY_CARE_PROVIDER_SITE_OTHER): Payer: Self-pay | Admitting: Surgery

## 2012-11-21 ENCOUNTER — Encounter (INDEPENDENT_AMBULATORY_CARE_PROVIDER_SITE_OTHER): Payer: Self-pay | Admitting: Surgery

## 2012-11-29 ENCOUNTER — Other Ambulatory Visit: Payer: Self-pay

## 2013-01-03 ENCOUNTER — Emergency Department (HOSPITAL_COMMUNITY)
Admission: EM | Admit: 2013-01-03 | Discharge: 2013-01-03 | Disposition: A | Discharge status: Home / Self Care | Payer: Self-pay | Attending: Emergency Medicine | Admitting: Emergency Medicine

## 2013-01-03 ENCOUNTER — Emergency Department (HOSPITAL_COMMUNITY): Payer: Self-pay

## 2013-01-03 ENCOUNTER — Encounter (HOSPITAL_COMMUNITY): Payer: Self-pay | Admitting: Emergency Medicine

## 2013-01-03 DIAGNOSIS — L0201 Cutaneous abscess of face: Secondary | ICD-10-CM | POA: Insufficient documentation

## 2013-01-03 DIAGNOSIS — I10 Essential (primary) hypertension: Secondary | ICD-10-CM | POA: Insufficient documentation

## 2013-01-03 DIAGNOSIS — R609 Edema, unspecified: Secondary | ICD-10-CM | POA: Insufficient documentation

## 2013-01-03 DIAGNOSIS — F3289 Other specified depressive episodes: Secondary | ICD-10-CM | POA: Insufficient documentation

## 2013-01-03 DIAGNOSIS — Z872 Personal history of diseases of the skin and subcutaneous tissue: Secondary | ICD-10-CM | POA: Insufficient documentation

## 2013-01-03 DIAGNOSIS — E785 Hyperlipidemia, unspecified: Secondary | ICD-10-CM | POA: Insufficient documentation

## 2013-01-03 DIAGNOSIS — F411 Generalized anxiety disorder: Secondary | ICD-10-CM | POA: Insufficient documentation

## 2013-01-03 DIAGNOSIS — Z87891 Personal history of nicotine dependence: Secondary | ICD-10-CM | POA: Insufficient documentation

## 2013-01-03 DIAGNOSIS — Z794 Long term (current) use of insulin: Secondary | ICD-10-CM | POA: Insufficient documentation

## 2013-01-03 DIAGNOSIS — I509 Heart failure, unspecified: Secondary | ICD-10-CM | POA: Insufficient documentation

## 2013-01-03 DIAGNOSIS — E1149 Type 2 diabetes mellitus with other diabetic neurological complication: Secondary | ICD-10-CM | POA: Insufficient documentation

## 2013-01-03 DIAGNOSIS — G4733 Obstructive sleep apnea (adult) (pediatric): Secondary | ICD-10-CM | POA: Insufficient documentation

## 2013-01-03 DIAGNOSIS — L03211 Cellulitis of face: Secondary | ICD-10-CM | POA: Insufficient documentation

## 2013-01-03 DIAGNOSIS — Z79899 Other long term (current) drug therapy: Secondary | ICD-10-CM | POA: Insufficient documentation

## 2013-01-03 DIAGNOSIS — F329 Major depressive disorder, single episode, unspecified: Secondary | ICD-10-CM | POA: Insufficient documentation

## 2013-01-03 DIAGNOSIS — G609 Hereditary and idiopathic neuropathy, unspecified: Secondary | ICD-10-CM | POA: Insufficient documentation

## 2013-01-03 DIAGNOSIS — R6883 Chills (without fever): Secondary | ICD-10-CM | POA: Insufficient documentation

## 2013-01-03 LAB — POCT I-STAT, CHEM 8
Chloride: 98 mEq/L (ref 96–112)
Glucose, Bld: 249 mg/dL — ABNORMAL HIGH (ref 70–99)
HCT: 37 % (ref 36.0–46.0)
Hemoglobin: 12.6 g/dL (ref 12.0–15.0)
Potassium: 5.3 mEq/L — ABNORMAL HIGH (ref 3.5–5.1)
Sodium: 134 mEq/L — ABNORMAL LOW (ref 135–145)

## 2013-01-03 MED ORDER — DOXYCYCLINE HYCLATE 100 MG PO CAPS: 100.0000 mg | ORAL_CAPSULE | Freq: Two times a day (BID) | ORAL | Status: DC

## 2013-01-03 MED ORDER — IOHEXOL 300 MG/ML  SOLN
80.0000 mL | Freq: Once | INTRAMUSCULAR | Status: AC | PRN
  Administered 2013-01-03: 80 mL via INTRAVENOUS

## 2013-01-03 MED ORDER — MORPHINE SULFATE 4 MG/ML IJ SOLN
4.0000 mg | Freq: Once | INTRAMUSCULAR | Status: AC
  Administered 2013-01-03: 4 mg via INTRAVENOUS
  Filled 2013-01-03: qty 1

## 2013-01-03 MED ORDER — CLINDAMYCIN PHOSPHATE 600 MG/50ML IV SOLN
600.0000 mg | Freq: Once | INTRAVENOUS | Status: AC
  Administered 2013-01-03: 600 mg via INTRAVENOUS
  Filled 2013-01-03: qty 50

## 2013-01-03 MED ORDER — HYDROCODONE-ACETAMINOPHEN 5-325 MG PO TABS: 2.0000 | ORAL_TABLET | Freq: Four times a day (QID) | ORAL | Status: DC | PRN

## 2013-01-03 MED ORDER — SODIUM CHLORIDE 0.9 % IV BOLUS (SEPSIS)
500.0000 mL | Freq: Once | INTRAVENOUS | Status: AC
  Administered 2013-01-03: 500 mL via INTRAVENOUS

## 2013-01-03 MED ORDER — PROMETHAZINE HCL 25 MG PO TABS: 25.0000 mg | ORAL_TABLET | Freq: Four times a day (QID) | ORAL | Status: DC | PRN

## 2013-01-03 NOTE — ED Provider Notes (Signed)
CSN: [id number]     Arrival date & time 01/03/13  [id number] History     First MD Initiated Contact with Patient 01/03/13 [phone]     Chief Complaint  Patient presents with  . Facial Swelling  . Abscess   (Consider location/radiation/quality/duration/timing/severity/associated sxs/prior Treatment) HPI Comments: Patient is a 37 year old female with history of prior abscess or surgical drainage, diabetes, Hyperlipidemia, hypertension, CHF who presents today with 2 weeks of gradually worsening facial swelling. She states that the pain and swelling initially began as a small pimple which she tried to pop on the left side of her face. It worsened over the past 2 days. The patient presents today because she "can't take it anymore". The swelling is so severe that she has limited ability to open her left eye. She denies visual disturbance, pain with EOM, photophobia. She reports that she had chills last night, but does not know of any fever. Nothing makes her pain better, however she has not tried anything. No nausea, vomiting, abdominal pain, ear pain, sore throat, trismus.  The history is provided by the patient. No language interpreter was used.    Past Medical History  Diagnosis Date  . Diabetes mellitus     Type 2  . Hyperlipidemia   . Hypertension   . Anxiety   . Depression   . Abscess     history of multiple abscesses  . Anemia 2002    of chronic disease  . Obesity   . Peripheral neuropathy   . Abnormal Pap smear of cervix 2009  . Endocarditis 2002    subacute bacterial endocarditis.   . OSA on CPAP     non-compliant with home CPAP  . Edema of lower extremity   . CHF (congestive heart failure)    Past Surgical History  Procedure Laterality Date  . Incision and drainage abscess      multiple I&Ds  . Irrigation and debridement abscess Left 07/06/2012    Procedure: IRRIGATION AND DEBRIDEMENT ABSCESS BUTTOCKS AND THIGH;  Surgeon: Shann Medal, MD;  Location: [place];  Service: General;   Laterality: Left;  . Incision and drainage of wound Left 07/07/2012    Procedure: IRRIGATION AND DEBRIDEMENT WOUND;  Surgeon: Harl Bowie, MD;  Location: [place];  Service: General;  Laterality: Left;  . Incision and drainage abscess Left 07/09/2012    Procedure: DRESSING CHANGE, THIGH WOUND;  Surgeon: Harl Bowie, MD;  Location: [place];  Service: General;  Laterality: Left;  . Incision and drainage perirectal abscess Left 07/14/2012    Procedure: DEBRIDEMENT OF SKIN & SOFT TISSUE; DRESSING CHANGE UNDER ANESTHESIA;  Surgeon: Gayland Curry, MD,FACS;  Location: [place];  Service: General;  Laterality: Left;  . Incision and drainage perirectal abscess Left 07/16/2012    Procedure: I&D Left Thigh;  Surgeon: Gwenyth Ober, MD;  Location: [place];  Service: General;  Laterality: Left;  . Irrigation and debridement abscess Left 08/10/2012    Procedure: IRRIGATION AND DEBRIDEMENT ABSCESS;  Surgeon: Madilyn Hook, DO;  Location: [place];  Service: General;  Laterality: Left;  . Tonsillectomy  age 32   Family History  Problem Relation Age of Onset  . Heart failure Mother   . Diabetes Mother   . Kidney disease Mother   . Kidney disease Father   . Diabetes Paternal Grandmother   . Heart failure Paternal Grandmother    History  Substance Use Topics  . Smoking status: Former Smoker -- 1.00 packs/day  . Smokeless  tobacco: Not on file  . Alcohol Use: Yes     Comment: ocassional   OB History   Grav Para Term Preterm Abortions TAB SAB Ect Mult Living                 Review of Systems  Constitutional: Positive for chills. Negative for fever.  Eyes: Negative for photophobia, discharge, redness, itching and visual disturbance.  Respiratory: Negative for shortness of breath.   Cardiovascular: Negative for chest pain.  Gastrointestinal: Negative for nausea, vomiting and abdominal pain.  Skin:       swelling  All other systems reviewed and are negative.    Allergies  Vancomycin  Home  Medications   Current Outpatient Rx  Name  Route  Sig  Dispense  Refill  . acetaminophen (TYLENOL) 500 MG tablet   Oral   Take 1,000 mg by mouth daily as needed for pain.         Marland Kitchen FLUoxetine (PROZAC) 40 MG capsule   Oral   Take 40 mg by mouth daily.         . insulin aspart (NOVOLOG) 100 UNIT/ML injection   Subcutaneous   Inject 3-7 Units into the skin 3 (three) times daily with meals. *per sliding scale         . insulin NPH (HUMULIN N,NOVOLIN N) 100 UNIT/ML injection   Subcutaneous   Inject 8 Units into the skin 2 (two) times daily.         Marland Kitchen levothyroxine (SYNTHROID, LEVOTHROID) 50 MCG tablet   Oral   Take 25 mcg by mouth daily before breakfast.         . lisinopril (PRINIVIL,ZESTRIL) 5 MG tablet   Oral   Take 5 mg by mouth daily.         . pravastatin (PRAVACHOL) 20 MG tablet   Oral   Take 20 mg by mouth every evening.          BP 118/87  Pulse 94  Temp(Src) 97.2 F (36.2 C)  Resp 18  SpO2 98%  LMP 12/04/2012 Physical Exam  Nursing note and vitals reviewed. Constitutional: She is oriented to person, place, and time. She appears well-developed and well-nourished. No distress.  HENT:  Head: Normocephalic and atraumatic.  Right Ear: External ear normal.  Left Ear: External ear normal.  Nose: Nose normal.  Mouth/Throat: Oropharynx is clear and moist.  Eyes: Conjunctivae and EOM are normal. Pupils are equal, round, and reactive to light.  Significant swelling around left eye. EOMs intact without signs of entrapment. Conjunctiva is not injected. No photophobia and no pain with consensual light reflex.   Neck: Normal range of motion.  Cardiovascular: Normal rate, regular rhythm and normal heart sounds.   Pulmonary/Chest: Effort normal and breath sounds normal. No stridor. No respiratory distress. She has no wheezes. She has no rales.  Abdominal: Soft. She exhibits no distension.  Musculoskeletal: Normal range of motion.  Neurological: She is alert  and oriented to person, place, and time. She has normal strength.  Skin: Skin is warm and dry. She is not diaphoretic. No erythema.  Psychiatric: She has a normal mood and affect. Her behavior is normal.    ED Course   Procedures (including critical care time)  Labs Reviewed  POCT I-STAT, CHEM 8 - Abnormal; Notable for the following:    Sodium 134 (*)    Potassium 5.3 (*)    Glucose, Bld 249 (*)    All other components within normal limits  CULTURE, BLOOD (ROUTINE X 2)  CULTURE, BLOOD (ROUTINE X 2)  CBC WITH DIFFERENTIAL   Ct Maxillofacial W/cm  01/03/2013   *RADIOLOGY REPORT*  Clinical Data: Diabetic hypertensive patient presenting with facial swelling.  CT MAXILLOFACIAL WITH CONTRAST  Technique:  Multidetector CT imaging of the maxillofacial structures was performed with intravenous contrast. Multiplanar CT image reconstructions were also generated.  Contrast: 71mL OMNIPAQUE IOHEXOL 300 MG/ML  SOLN  Comparison: 04/11/2011.  Findings: Diffuse left facial cellulitis without deep drainable abscess.  Source indeterminate as dental source is not clearly visualized.  Inflammation extends in a left preseptal position without postseptal inflammation noted.  Exophthalmos incidentally noted.  Mucosal thickening paranasal sinuses with a slightly polypoid appearance.  Mastoid air cells and middle ear cavities are clear.  Visualized intracranial structures unremarkable.  Scattered increased number of normal to slightly prominent lymph nodes larger on the left.  No septic thrombophlebitis detected.  IMPRESSION: Diffuse left facial cellulitis without deep drainable abscess. Source indeterminate as dental source is not clearly visualized.  Inflammation extends in a left preseptal position without postseptal inflammation noted.  Exophthalmos incidentally noted.  Mucosal thickening paranasal sinuses with a slightly polypoid appearance.  Mastoid air cells and middle ear cavities are clear.  Scattered increased  number of normal to slightly prominent lymph nodes larger on the left.   Original Report Authenticated By: Genia Del, M.D.   1. Facial cellulitis     MDM  Patient presents with facial cellulitis. CT shows diffuse left facial cellulitis without deep drainable abscess. Inflammation extends in a left preseptal position without post-septal inflammation. Discussed these results with the patient. She was given IV antibiotics in the emergency department as well as 4 mg of morphine. Discussed possibility of admission with the patient and she wishes to go home. I believe the patient will be safe at home. She was given PO doxycycline. She was not given a rx clindamycin as she has not job and no insurance and I do not believe she would be able to afford it. Strict return instructions given. Vital signs stable for discharge. Dr. Curly Rim evaluated patient and agrees with plan. Patient / Family / Caregiver informed of clinical course, understand medical decision-making process, and agree with plan.  Medications  morphine 4 MG/ML injection 4 mg (4 mg Intravenous Given 01/03/13 1052)  clindamycin (CLEOCIN) IVPB 600 mg (0 mg Intravenous Stopped 01/03/13 1400)  sodium chloride 0.9 % bolus 500 mL (0 mL Intravenous Stopped 01/03/13 1400)  iohexol (OMNIPAQUE) 300 MG/ML solution 80 mL (80 mL Intravenous Contrast Given 01/03/13 1148)     Elwyn Lade, PA-C 01/03/13 1510

## 2013-01-03 NOTE — ED Provider Notes (Signed)
Medical screening examination/treatment/procedure(s) were conducted as a shared visit with non-physician practitioner(s) and myself.  I personally evaluated the patient during the encounter   Elmer Sow, MD 01/03/13 [phone]

## 2013-01-03 NOTE — ED Notes (Signed)
Pt stated that she developed a bump on the left side of her head, temporal area about two weeks ago. And now it has caused her left sided facial swelling and temporal pain.

## 2013-01-03 NOTE — ED Notes (Signed)
Pt c/o left side facial swelling d/t abscess to left temporal lobe. Pt stated that " it started has a pimple 2 weeks ago and has been swelling ever since".

## 2013-01-09 LAB — CULTURE, BLOOD (ROUTINE X 2)
Culture: NO GROWTH
Culture: NO GROWTH

## 2013-02-26 ENCOUNTER — Emergency Department (HOSPITAL_COMMUNITY)
Admission: EM | Admit: 2013-02-26 | Discharge: 2013-02-27 | Disposition: A | Discharge status: Home / Self Care | Payer: Self-pay | Attending: Emergency Medicine | Admitting: Emergency Medicine

## 2013-02-26 ENCOUNTER — Encounter (HOSPITAL_COMMUNITY): Payer: Self-pay | Admitting: Nurse Practitioner

## 2013-02-26 ENCOUNTER — Ambulatory Visit (INDEPENDENT_AMBULATORY_CARE_PROVIDER_SITE_OTHER): Payer: Self-pay | Admitting: Internal Medicine

## 2013-02-26 ENCOUNTER — Ambulatory Visit (INDEPENDENT_AMBULATORY_CARE_PROVIDER_SITE_OTHER): Payer: MEDICAID | Admitting: Internal Medicine

## 2013-02-26 DIAGNOSIS — Z862 Personal history of diseases of the blood and blood-forming organs and certain disorders involving the immune mechanism: Secondary | ICD-10-CM | POA: Insufficient documentation

## 2013-02-26 DIAGNOSIS — I509 Heart failure, unspecified: Secondary | ICD-10-CM | POA: Insufficient documentation

## 2013-02-26 DIAGNOSIS — R45851 Suicidal ideations: Secondary | ICD-10-CM

## 2013-02-26 DIAGNOSIS — I10 Essential (primary) hypertension: Secondary | ICD-10-CM | POA: Insufficient documentation

## 2013-02-26 DIAGNOSIS — E119 Type 2 diabetes mellitus without complications: Secondary | ICD-10-CM | POA: Insufficient documentation

## 2013-02-26 DIAGNOSIS — Z8669 Personal history of other diseases of the nervous system and sense organs: Secondary | ICD-10-CM | POA: Insufficient documentation

## 2013-02-26 DIAGNOSIS — G4733 Obstructive sleep apnea (adult) (pediatric): Secondary | ICD-10-CM | POA: Insufficient documentation

## 2013-02-26 DIAGNOSIS — F329 Major depressive disorder, single episode, unspecified: Secondary | ICD-10-CM | POA: Insufficient documentation

## 2013-02-26 DIAGNOSIS — F17200 Nicotine dependence, unspecified, uncomplicated: Secondary | ICD-10-CM | POA: Insufficient documentation

## 2013-02-26 DIAGNOSIS — Z79899 Other long term (current) drug therapy: Secondary | ICD-10-CM | POA: Insufficient documentation

## 2013-02-26 DIAGNOSIS — E669 Obesity, unspecified: Secondary | ICD-10-CM | POA: Insufficient documentation

## 2013-02-26 DIAGNOSIS — Z3202 Encounter for pregnancy test, result negative: Secondary | ICD-10-CM | POA: Insufficient documentation

## 2013-02-26 DIAGNOSIS — G8929 Other chronic pain: Secondary | ICD-10-CM | POA: Insufficient documentation

## 2013-02-26 DIAGNOSIS — Z872 Personal history of diseases of the skin and subcutaneous tissue: Secondary | ICD-10-CM | POA: Insufficient documentation

## 2013-02-26 DIAGNOSIS — F411 Generalized anxiety disorder: Secondary | ICD-10-CM | POA: Insufficient documentation

## 2013-02-26 DIAGNOSIS — Z8679 Personal history of other diseases of the circulatory system: Secondary | ICD-10-CM | POA: Insufficient documentation

## 2013-02-26 DIAGNOSIS — Z794 Long term (current) use of insulin: Secondary | ICD-10-CM | POA: Insufficient documentation

## 2013-02-26 DIAGNOSIS — F3289 Other specified depressive episodes: Secondary | ICD-10-CM | POA: Insufficient documentation

## 2013-02-26 DIAGNOSIS — E785 Hyperlipidemia, unspecified: Secondary | ICD-10-CM | POA: Insufficient documentation

## 2013-02-26 LAB — COMPREHENSIVE METABOLIC PANEL
ALT: 12 U/L (ref 0–35)
AST: 14 U/L (ref 0–37)
Alkaline Phosphatase: 82 U/L (ref 39–117)
CO2: 28 mEq/L (ref 19–32)
Chloride: 101 mEq/L (ref 96–112)
GFR calc Af Amer: >90 mL/min (ref >90)
GFR calc non Af Amer: 85 mL/min — ABNORMAL LOW (ref >90)
Glucose, Bld: 190 mg/dL — ABNORMAL HIGH (ref 70–99)
Potassium: 4.1 mEq/L (ref 3.5–5.1)
Sodium: 136 mEq/L (ref 135–145)

## 2013-02-26 LAB — RAPID URINE DRUG SCREEN, HOSP PERFORMED
Amphetamines: NOT DETECTED
Barbiturates: NOT DETECTED
Opiates: NOT DETECTED
Tetrahydrocannabinol: NOT DETECTED

## 2013-02-26 LAB — CBC
Hemoglobin: 11.6 g/dL — ABNORMAL LOW (ref 12.0–15.0)
Platelets: 214 10*3/uL (ref 150–400)
RBC: 4.28 MIL/uL (ref 3.87–5.11)
WBC: 5.3 10*3/uL (ref 4.0–10.5)

## 2013-02-26 LAB — ACETAMINOPHEN LEVEL: Acetaminophen (Tylenol), Serum: <15 ug/mL (ref 10–30)

## 2013-02-26 NOTE — ED Notes (Signed)
Patient placed in hospital gown, paper scrubs were not large enough for her. All personal belongings are bagged and labeled at nurses station. Security wanded patient and sitter is at bedside.

## 2013-02-26 NOTE — ED Notes (Signed)
Pt sts she has not had any of her antipsychotic or depression medications since March.  Pt told by MD to come to ED for evaluation of her SI and HI thoughts.

## 2013-02-26 NOTE — Progress Notes (Signed)
Subjective:   Patient ID: [name] [name] female   DOB: 01-Nov-1975 37 y.o.   MRN: [id number]  HPI: [name] is a 37 y.o. woman with a pmhx detailed below who comes to the clinic today for medication refill.  The patient was unable to make her appointment on time. She called in 15 minutes after the appointment was scheduled to say that she was running late. She was unable to get to the clinic until 1 hour after the appointment. I did not plan to be able to see the patient because I had a full schedule for the remainder of the day. No other clinic spots were available in our clinic. The triage nurse informed me that the patient was very tearful and upset. She was concerned for the patients safety so I agreed to see the patient in order to evaluate her for safety to go home until the next appointment is available.  The patient states that she has been off all of her medications since May. She she states that she has been sad, depressed, and anxious since April when she was discharge from the hospital after having been treated for an abscess. Her psychiatric problems have become much worse recently. She has a lot of stress taking care of her mother who appears to be dependent on and living with her. The patient has intrusive thoughts of hurting herself and her mother. She is afraid that she may kill herself. She has a plan to kill herself by jumping off a bridge. She is very tearful and states that she is embarrassed for feeling this way. She has a history of being admitted to for inpatient psychiatric help when she was a teenager.  Today, the patient was walking to her appointment to reestablish care. On the way, she came to an overpass. She was unable to cross the overpass because she was afraid that she would jump and kill herself. She tried to find a way around the bridge, but this took a long time making her lost. By the time she got around the overpass and knew where she was going  she realized that she would be very late. She called the clinic to let us know that she would be late.    Past Medical History  Diagnosis Date  . Diabetes mellitus     Type 2  . Hyperlipidemia   . Hypertension   . Anxiety   . Depression   . Abscess     history of multiple abscesses  . Anemia 2002    of chronic disease  . Obesity   . Peripheral neuropathy   . Abnormal Pap smear of cervix 2009  . Endocarditis 2002    subacute bacterial endocarditis.   . OSA on CPAP     non-compliant with home CPAP  . Edema of lower extremity   . CHF (congestive heart failure)   . Depression   . Anxiety    Current Outpatient Prescriptions  Medication Sig Dispense Refill  . insulin aspart (NOVOLOG) 100 UNIT/ML injection Inject 3-7 Units into the skin 3 (three) times daily with meals. *per sliding scale      . insulin NPH (HUMULIN N,NOVOLIN N) 100 UNIT/ML injection Inject 8 Units into the skin 2 (two) times daily.       No current facility-administered medications for this visit.   Family History  Problem Relation Age of Onset  . Heart failure Mother   . Diabetes Mother   . Kidney disease  Mother   . Kidney disease Father   . Diabetes Paternal Grandmother   . Heart failure Paternal Grandmother    History   Social History  . Marital Status: Single    Spouse Name: N/A    Number of Children: N/A  . Years of Education: N/A   Occupational History  . telemarketer     Phone surveys   Social History Main Topics  . Smoking status: Current Every Day Smoker -- 1.00 packs/day    Types: Cigarettes  . Smokeless tobacco: Not on file  . Alcohol Use: No  . Drug Use: No  . Sexual Activity: Not on file   Other Topics Concern  . Not on file   Social History Narrative   Lives with mother currently,    Review of Systems: Review of systems not obtained due to patient factors. Objective:  Physical Exam: Not Completed  Assessment & Plan:   # Suicidal ideation  The patient likely has a  mood disorder complicated by suicidal and homicidal ideation. She is unable to contract for safety at this time. I discussed at length the options of going home and having an appointment tomorrow versus going to the ED for acute treatment. We both agreed for the patient to go to the ED for further evaluation and treatment of her SI/HI. I escorted the patient to the ED triage where she checked in. Later I saw the patient in a room in the ED where she was being evaluated.

## 2013-02-26 NOTE — ED Notes (Signed)
The pt stating to the other rn that she wanted to go home.  Will speak to the edp

## 2013-02-26 NOTE — ED Notes (Signed)
The pt came out called her mother then decided that she would not stay

## 2013-02-26 NOTE — ED Notes (Signed)
Tele psy completed

## 2013-02-26 NOTE — ED Notes (Signed)
Pt states she has been having bad thoughts of harming others and she "Thinks the world would be better off without me, if i was dead i could get some rest." she told her physician and he told her to come here for care. Pt says she is having this feelings because she is "tired of being in pain" and reports she has chronic pain "all over." denies alcohol or drug use

## 2013-02-26 NOTE — ED Notes (Signed)
Sitter  At the bedside

## 2013-02-26 NOTE — ED Notes (Signed)
Sitting with pt at this time

## 2013-02-26 NOTE — ED Provider Notes (Signed)
CSN: [id number]     Arrival date & time 02/26/13  1504 History   First MD Initiated Contact with Patient 02/26/13 1549     Chief Complaint  Patient presents with  . Anxiety   (Consider location/radiation/quality/duration/timing/severity/associated sxs/prior Treatment) Patient is a 37 y.o. female presenting with anxiety. The history is provided by the patient.  Anxiety This is a recurrent problem. Pertinent negatives include no chest pain, no abdominal pain, no headaches and no shortness of breath.   patient has had anxiety and depression. She states she has chronic pain in her hands and legs from neuropathy. She states she's has thought about dying because of it. She saw her primary care Dr. who sent her here for evaluation. She states she has thought about hurting herself. She also has episodes of anxiety. She states she's been having difficulty sleeping. No numbness or weakness. She has occasional headaches. No confusion. She has a history of depression and anxiety. She states she does not see anyone for it.  Past Medical History  Diagnosis Date  . Diabetes mellitus     Type 2  . Hyperlipidemia   . Hypertension   . Anxiety   . Depression   . Abscess     history of multiple abscesses  . Anemia 2002    of chronic disease  . Obesity   . Peripheral neuropathy   . Abnormal Pap smear of cervix 2009  . Endocarditis 2002    subacute bacterial endocarditis.   . OSA on CPAP     non-compliant with home CPAP  . Edema of lower extremity   . CHF (congestive heart failure)   . Depression   . Anxiety    Past Surgical History  Procedure Laterality Date  . Incision and drainage abscess      multiple I&Ds  . Irrigation and debridement abscess Left 07/06/2012    Procedure: IRRIGATION AND DEBRIDEMENT ABSCESS BUTTOCKS AND THIGH;  Surgeon: Shann Medal, MD;  Location: [place];  Service: General;  Laterality: Left;  . Incision and drainage of wound Left 07/07/2012    Procedure: IRRIGATION AND  DEBRIDEMENT WOUND;  Surgeon: Harl Bowie, MD;  Location: [place];  Service: General;  Laterality: Left;  . Incision and drainage abscess Left 07/09/2012    Procedure: DRESSING CHANGE, THIGH WOUND;  Surgeon: Harl Bowie, MD;  Location: [place];  Service: General;  Laterality: Left;  . Incision and drainage perirectal abscess Left 07/14/2012    Procedure: DEBRIDEMENT OF SKIN & SOFT TISSUE; DRESSING CHANGE UNDER ANESTHESIA;  Surgeon: Gayland Curry, MD,FACS;  Location: [place];  Service: General;  Laterality: Left;  . Incision and drainage perirectal abscess Left 07/16/2012    Procedure: I&D Left Thigh;  Surgeon: Gwenyth Ober, MD;  Location: [place];  Service: General;  Laterality: Left;  . Irrigation and debridement abscess Left 08/10/2012    Procedure: IRRIGATION AND DEBRIDEMENT ABSCESS;  Surgeon: Madilyn Hook, DO;  Location: [place];  Service: General;  Laterality: Left;  . Tonsillectomy  age 21   Family History  Problem Relation Age of Onset  . Heart failure Mother   . Diabetes Mother   . Kidney disease Mother   . Kidney disease Father   . Diabetes Paternal Grandmother   . Heart failure Paternal Grandmother    History  Substance Use Topics  . Smoking status: Current Every Day Smoker -- 1.00 packs/day    Types: Cigarettes  . Smokeless tobacco: Not on file  . Alcohol  Use: No   OB History   Grav Para Term Preterm Abortions TAB SAB Ect Mult Living                 Review of Systems  Constitutional: Negative for activity change and appetite change.  HENT: Negative for neck stiffness.   Eyes: Negative for pain.  Respiratory: Negative for chest tightness and shortness of breath.   Cardiovascular: Negative for chest pain and leg swelling.  Gastrointestinal: Negative for nausea, vomiting, abdominal pain and diarrhea.  Genitourinary: Negative for flank pain.  Musculoskeletal: Negative for back pain.  Skin: Negative for rash.  Neurological: Negative for weakness, numbness and  headaches.  Psychiatric/Behavioral: Positive for suicidal ideas and sleep disturbance. Negative for behavioral problems. The patient is nervous/anxious.     Allergies  Vancomycin  Home Medications   Current Outpatient Rx  Name  Route  Sig  Dispense  Refill  . insulin aspart (NOVOLOG) 100 UNIT/ML injection   Subcutaneous   Inject 3-7 Units into the skin 3 (three) times daily with meals. *per sliding scale         . insulin NPH (HUMULIN N,NOVOLIN N) 100 UNIT/ML injection   Subcutaneous   Inject 8 Units into the skin 2 (two) times daily.         . busPIRone (BUSPAR) 10 MG tablet   Oral   Take 1 tablet (10 mg total) by mouth 2 (two) times daily.   60 tablet   0   . FLUoxetine (PROZAC) 20 MG tablet   Oral   Take 2 tablets (40 mg total) by mouth daily.   60 tablet   0   . hydrOXYzine (ATARAX/VISTARIL) 50 MG tablet   Oral   Take 0.5 tablets (25 mg total) by mouth 3 (three) times daily as needed for itching or anxiety.   60 tablet   0    BP 137/95  Pulse 91  Resp 20  Wt 301 lb 6.4 oz (136.714 kg)  BMI 43.25 kg/m2  SpO2 99% Physical Exam  Nursing note and vitals reviewed. Constitutional: She is oriented to person, place, and time. She appears well-developed and well-nourished.  HENT:  Head: Normocephalic and atraumatic.  Eyes: EOM are normal. Pupils are equal, round, and reactive to light.  Neck: Normal range of motion. Neck supple.  Cardiovascular: Normal rate, regular rhythm and normal heart sounds.   No murmur heard. Pulmonary/Chest: Effort normal and breath sounds normal. No respiratory distress. She has no wheezes. She has no rales.  Abdominal: Soft. Bowel sounds are normal. She exhibits no distension. There is no tenderness. There is no rebound and no guarding.  Musculoskeletal: Normal range of motion.  Neurological: She is alert and oriented to person, place, and time. No cranial nerve deficit.  Skin: Skin is warm and dry.  Psychiatric: Her speech is  normal.  Patient appears tearful and depressed.    ED Course  Procedures (including critical care time) Labs Review Labs Reviewed  CBC - Abnormal; Notable for the following:    Hemoglobin 11.6 (*)    HCT 34.1 (*)    All other components within normal limits  COMPREHENSIVE METABOLIC PANEL - Abnormal; Notable for the following:    Glucose, Bld 190 (*)    Albumin 3.2 (*)    Total Bilirubin 0.2 (*)    GFR calc non Af Amer 85 (*)    All other components within normal limits  SALICYLATE LEVEL - Abnormal; Notable for the following:    Salicylate  Lvl <2.0 (*)    All other components within normal limits  ACETAMINOPHEN LEVEL  ETHANOL  URINE RAPID DRUG SCREEN (HOSP PERFORMED)  POCT PREGNANCY, URINE   Imaging Review No results found.  MDM   1. Generalized anxiety disorder    Patient with suicidal thoughts. Due to chronic pain. Medically cleared. To be seen by TTs    Jasper Riling. Alvino Chapel, MD 03/01/13 1037

## 2013-02-26 NOTE — Progress Notes (Signed)
   Subjective:   Patient ID: [name] [name] female   DOB: [birth year] 37 y.o.   MRN: [id number]  HPI:  See other note in epic.

## 2013-02-27 MED ORDER — HYDROXYZINE HCL 50 MG PO TABS: 25.0000 mg | ORAL_TABLET | Freq: Three times a day (TID) | ORAL | Status: DC | PRN

## 2013-02-27 MED ORDER — FLUOXETINE HCL 20 MG PO TABS: 40.0000 mg | ORAL_TABLET | Freq: Every day | ORAL | Status: DC

## 2013-02-27 MED ORDER — BUSPIRONE HCL 10 MG PO TABS: 10.0000 mg | ORAL_TABLET | Freq: Two times a day (BID) | ORAL | Status: DC

## 2013-02-27 NOTE — ED Notes (Signed)
The pt has been out to the desk x 2 in the last  Few minutes. She wants to leave.   Information given to dr Sabra Heck.

## 2013-02-27 NOTE — ED Notes (Signed)
Pt sleeping. 

## 2013-02-27 NOTE — ED Notes (Signed)
The pt up to the br.  Co-operative

## 2013-02-27 NOTE — Consult Note (Signed)
Telepsych Consultation   Reason for Consult:  Increased anxiety symptoms  Referring Physician:  Dr. Marcha Dutton [name] [name] is an 37 y.o. female.  Assessment: AXIS I:  Generalized Anxiety Disorder AXIS II:  Deferred AXIS III:   Past Medical History  Diagnosis Date  . Diabetes mellitus     Type 2  . Hyperlipidemia   . Hypertension   . Anxiety   . Depression   . Abscess     history of multiple abscesses  . Anemia 2002    of chronic disease  . Obesity   . Peripheral neuropathy   . Abnormal Pap smear of cervix 2009  . Endocarditis 2002    subacute bacterial endocarditis.   . OSA on CPAP     non-compliant with home CPAP  . Edema of lower extremity   . CHF (congestive heart failure)   . Depression   . Anxiety    AXIS IV:  economic problems, occupational problems and other psychosocial or environmental problems AXIS V:  51-60 moderate symptoms  Plan:  No evidence of imminent risk to self or others at present.   Patient does not meet criteria for psychiatric inpatient admission. Discussed crisis plan, support from social network, calling 911, coming to the Emergency Department, and calling Suicide Hotline. Is provided with counseling sessions referral.  Subjective: "I had high anxiety attack yesterday".   [name] [name] is a 37 y.o. female patient admitted with complaints of increased anxiety symptoms.  HPI:  [name] [name] is a 37 year old African-American female who came to [facility] ED with complaints of increased anxiety symptoms and suicidal ideations. She reports, "I had high anxiety attack yesterday. I had an appointment with my primary care physician yesterday. But I was feeling very anxious. I decided to walk to the [facility] hospital ED to get checked out, but I got lost on my way to the hospital. That got me freaked out. I felt suicidal then. But it was just a thought that came across my mind because I was frustrated. By the time I got to the ED, I was  already stressed and overwhelmed. The very person that I talked to at ED started to ask me a lot of questions. I said to her that I can't take this high anxiety any more, and may be I should not be here no more. That statement led to all these. I'm being watched and monitored like I'm just gonna just hurt myself. I'm not suicidal. I'm not depressed, but I do have bad anxiety. I was taking Fluoxetine 40 mg many months ago. I stopped taking it last April. I need to get back on it because it was helping me. I did attempt suicide at the age of 89. I took some Aspirin. It did nothing to me but made me feel sick to my stomach. That was it. I have been out of work, no money for my medicines. I live with my mother and I have no transportation.  HPI Elements:   Location:  [facility] ED. Quality:  Increased anxiety, suicidal ideations without plans, stressed and overwhelmed. Severity:  Mild. Timing:  Started yesterday. Duration:  chronic anxiety symptoms. Context:  Was feeling high anxiety trying to get to the hospitl, got lost, felt overwhelmed and made a suicidal gesture.  Past Psychiatric History: Past Medical History  Diagnosis Date  . Diabetes mellitus     Type 2  . Hyperlipidemia   . Hypertension   . Anxiety   .  Depression   . Abscess     history of multiple abscesses  . Anemia 2002    of chronic disease  . Obesity   . Peripheral neuropathy   . Abnormal Pap smear of cervix 2009  . Endocarditis 2002    subacute bacterial endocarditis.   . OSA on CPAP     non-compliant with home CPAP  . Edema of lower extremity   . CHF (congestive heart failure)   . Depression   . Anxiety     reports that she has been smoking Cigarettes.  She has been smoking about 1.00 pack per day. She does not have any smokeless tobacco history on file. She reports that she does not drink alcohol or use illicit drugs. Family History  Problem Relation Age of Onset  . Heart failure Mother   . Diabetes Mother    . Kidney disease Mother   . Kidney disease Father   . Diabetes Paternal Grandmother   . Heart failure Paternal Grandmother    Family History Substance Abuse: Yes, Describe: (Father & uncle were drug abusers) Family Supports: Yes, List: (Mother is supportive) Living Arrangements: Parent (Living with mother) Can pt return to current living arrangement?: Yes Allergies:   Allergies  Allergen Reactions  . Vancomycin     Acute renal failure suspected secondary to vanco    ACT Assessment Complete:  No:   Past Psychiatric History: Diagnosis:  Generalized anxiety disorder  Hospitalizations: NA   Outpatient Care: With Dr. Elouise Munroe internal medicine  Substance Abuse Care: Denies any substance abuse hx  Self-Mutilation:  Denies  Suicidal Attempts:  Admitted 1 attempt at the age of 36, took some Aspirin tablets.  Homicidal Behaviors: Denies   Violent Behaviors: Denies   Place of Residence: [place], [place] Marital Status:  Single Employed/Unemployed:  Unemeployed Education:  High school Family Supports:  Mother  Objective: Blood pressure 164/96, pulse 84, temperature 0 F (-17.8 C), resp. rate 12, weight 136.714 kg (301 lb 6.4 oz), SpO2 100.00%.Body mass index is 43.25 kg/(m^2). Results for orders placed during the hospital encounter of 02/26/13 (from the past 72 hour(s))  ACETAMINOPHEN LEVEL     Status: None   Collection Time    02/26/13  4:06 PM      Result Value Range   Acetaminophen (Tylenol), Serum <15.0  10 - 30 ug/mL   Comment:            THERAPEUTIC CONCENTRATIONS VARY     SIGNIFICANTLY. A RANGE OF 10-30     ug/mL MAY BE AN EFFECTIVE     CONCENTRATION FOR MANY PATIENTS.     HOWEVER, SOME ARE BEST TREATED     AT CONCENTRATIONS OUTSIDE THIS     RANGE.     ACETAMINOPHEN CONCENTRATIONS     >150 ug/mL AT 4 HOURS AFTER     INGESTION AND >50 ug/mL AT 12     HOURS AFTER INGESTION ARE     OFTEN ASSOCIATED WITH TOXIC     REACTIONS.  CBC     Status: Abnormal   Collection Time     02/26/13  4:06 PM      Result Value Range   WBC 5.3  4.0 - 10.5 K/uL   RBC 4.28  3.87 - 5.11 MIL/uL   Hemoglobin 11.6 (*) 12.0 - 15.0 g/dL   HCT 34.1 (*) 36.0 - 46.0 %   MCV 79.7  78.0 - 100.0 fL   MCH 27.1  26.0 - 34.0 pg   MCHC 34.0  30.0 - 36.0 g/dL   RDW 12.8  11.5 - 15.5 %   Platelets 214  150 - 400 K/uL  COMPREHENSIVE METABOLIC PANEL     Status: Abnormal   Collection Time    02/26/13  4:06 PM      Result Value Range   Sodium 136  135 - 145 mEq/L   Potassium 4.1  3.5 - 5.1 mEq/L   Chloride 101  96 - 112 mEq/L   CO2 28  19 - 32 mEq/L   Glucose, Bld 190 (*) 70 - 99 mg/dL   BUN 19  6 - 23 mg/dL   Creatinine, Ser 0.87  0.50 - 1.10 mg/dL   Calcium 9.1  8.4 - 10.5 mg/dL   Total Protein 7.1  6.0 - 8.3 g/dL   Albumin 3.2 (*) 3.5 - 5.2 g/dL   AST 14  0 - 37 U/L   ALT 12  0 - 35 U/L   Alkaline Phosphatase 82  39 - 117 U/L   Total Bilirubin 0.2 (*) 0.3 - 1.2 mg/dL   GFR calc non Af Amer 85 (*) >90 mL/min   GFR calc Af Amer >90  >90 mL/min   Comment: (NOTE)     The eGFR has been calculated using the CKD EPI equation.     This calculation has not been validated in all clinical situations.     eGFR's persistently <90 mL/min signify possible Chronic Kidney     Disease.  ETHANOL     Status: None   Collection Time    02/26/13  4:06 PM      Result Value Range   Alcohol, Ethyl (B) <11  0 - 11 mg/dL   Comment:            LOWEST DETECTABLE LIMIT FOR     SERUM ALCOHOL IS 11 mg/dL     FOR MEDICAL PURPOSES ONLY  SALICYLATE LEVEL     Status: Abnormal   Collection Time    02/26/13  4:06 PM      Result Value Range   Salicylate Lvl 123456 (*) 2.8 - 20.0 mg/dL  URINE RAPID DRUG SCREEN (HOSP PERFORMED)     Status: None   Collection Time    02/26/13  7:07 PM      Result Value Range   Opiates NONE DETECTED  NONE DETECTED   Cocaine NONE DETECTED  NONE DETECTED   Benzodiazepines NONE DETECTED  NONE DETECTED   Amphetamines NONE DETECTED  NONE DETECTED   Tetrahydrocannabinol NONE  DETECTED  NONE DETECTED   Barbiturates NONE DETECTED  NONE DETECTED   Comment:            DRUG SCREEN FOR MEDICAL PURPOSES     ONLY.  IF CONFIRMATION IS NEEDED     FOR ANY PURPOSE, NOTIFY LAB     WITHIN 5 DAYS.                LOWEST DETECTABLE LIMITS     FOR URINE DRUG SCREEN     Drug Class       Cutoff (ng/mL)     Amphetamine      1000     Barbiturate      200     Benzodiazepine   [id number]     Tricyclics       [id number]     Opiates          300     Cocaine  300     THC              50  POCT PREGNANCY, URINE     Status: None   Collection Time    02/26/13  7:23 PM      Result Value Range   Preg Test, Ur NEGATIVE  NEGATIVE   Comment:            THE SENSITIVITY OF THIS     METHODOLOGY IS >24 mIU/mL   Labs are reviewed and are pertinent for negative UDS results, toxicology reports <11.  No current facility-administered medications for this encounter.   Current Outpatient Prescriptions  Medication Sig Dispense Refill  . insulin aspart (NOVOLOG) 100 UNIT/ML injection Inject 3-7 Units into the skin 3 (three) times daily with meals. *per sliding scale      . insulin NPH (HUMULIN N,NOVOLIN N) 100 UNIT/ML injection Inject 8 Units into the skin 2 (two) times daily.      . busPIRone (BUSPAR) 10 MG tablet Take 1 tablet (10 mg total) by mouth 2 (two) times daily.  60 tablet  0  . FLUoxetine (PROZAC) 20 MG tablet Take 2 tablets (40 mg total) by mouth daily.  60 tablet  0  . hydrOXYzine (ATARAX/VISTARIL) 50 MG tablet Take 0.5 tablets (25 mg total) by mouth 3 (three) times daily as needed for itching or anxiety.  60 tablet  0    Psychiatric Specialty Exam:     Blood pressure 164/96, pulse 84, temperature 0 F (-17.8 C), resp. rate 12, weight 136.714 kg (301 lb 6.4 oz), SpO2 100.00%.Body mass index is 43.25 kg/(m^2).  General Appearance: Fairly Groomed  Engineer, water::  Good  Speech:  Clear and Coherent  Volume:  Normal  Mood:  Anxious  Affect:  Congruent  Thought Process:  Coherent, Goal  Directed and Intact  Orientation:  Full (Time, Place, and Person)  Thought Content:  Rumination and denies hallucinations  Suicidal Thoughts:  No  Homicidal Thoughts:  No  Memory:  Immediate;   Good Recent;   Good Remote;   Good  Judgement:  Good  Insight:  Good  Psychomotor Activity:  Normal  Concentration:  Good  Recall:  Good  Akathisia:  No  Handed:  Right  AIMS (if indicated):     Assets:  Communication Skills Desire for Improvement  Sleep:      Treatment Plan Summary: Medication Management: Recommended Fluoxetine 40 mg daily for depression. Buspar 10 mg bid for anxiety, Hydroxyzine 25 mg bid prn for anxiety. ED provider will write patient the actual prescriptions. Referral: Counseling/therapy sessions. Informed and encouraged to checkout [employer] lists of $4.00 prescription plan. Will follow-up with primary care physician for her other medical issues. Discharged to her home with her mother. Responsible for own transportation.  Encarnacion Slates, [place] 02/27/2013 4:29 PM

## 2013-02-27 NOTE — ED Notes (Signed)
The pt is still very determined to leave.  Dr Sabra Heck  And dr Christy Gentles made aware of the pts  Determination to leave

## 2013-02-27 NOTE — ED Notes (Signed)
Dr  Sabra Heck has spoken to the [name] [name] and has filled out ivc papers but does not want  To have them  noterized unless the [name] [name] attempts to leave.  Dr Sabra Heck has  Told the [name] [name] that  She needs to wait until tomorrow for another telepsy .  At present the [name] [name] has agreed to stay.Kennon Holter remains at the  bedside

## 2013-02-27 NOTE — BH Assessment (Signed)
Tele Assessment Note   [name] [name] is an 37 y.o. female.  Clinician talked with Dr. Alvino Chapel who gave information on why the patient needed a tele-assessment: depression, anxiety and suicidal statement to another medical caregiver. Patient was sent to [facility] by internal resident Dr. Margart Sickles after she had made suicidal statement.  Patient is very depressed.  She barely turned her head to talk to cliician and she looked down most of the assessment.  Patient has been having thoughts about killing herself with plan to overdose onher insulin.  She has one prior attempt when she was 37 years old.  Patient was asked what is her main stressor and she responded, "everything."  Patient is not consistene withher diabetic care.  She complains of foot and hand pain from neuropathy.  Patient says that she never gets out of the house (lives with mother) much and is very anxious when she does.  She says that she stays in bed much of the time.  She has thoughts of hurting others, "from time to time."  Patient goes on to say that she has no thought of harming anyone else but herself now.  Patient is denying any A/V hallucinations.  She is unable to contract for safety.  Patient and clinician talked about her going to inpatient care.  She has no current outpatient providers.   Patient has been asking ED staff if she can leave.  Clinician called [facility] and advised nurse Sunita and let her know that patient may need to be IVC'ed if she attempts to leave.  She said that patient had been advised about IVC being an option of she tries to leave.  Patient has chosen to stay and be run for a inpatient bed at [facility] when one becomes available. Patient care was discussed with Dr. Christy Gentles [facility]) and he is in favor of inpatient care for patient. Axis I: Anxiety Disorder NOS and Major Depression, Recurrent severe Axis II: Deferred Axis III:  Past Medical History  Diagnosis Date  . Diabetes mellitus     Type 2  . Hyperlipidemia    . Hypertension   . Anxiety   . Depression   . Abscess     history of multiple abscesses  . Anemia 2002    of chronic disease  . Obesity   . Peripheral neuropathy   . Abnormal Pap smear of cervix 2009  . Endocarditis 2002    subacute bacterial endocarditis.   . OSA on CPAP     non-compliant with home CPAP  . Edema of lower extremity   . CHF (congestive heart failure)   . Depression   . Anxiety    Axis IV: economic problems, housing problems, occupational problems, other psychosocial or environmental problems and problems related to social environment Axis V: 31-40 impairment in reality testing  Past Medical History:  Past Medical History  Diagnosis Date  . Diabetes mellitus     Type 2  . Hyperlipidemia   . Hypertension   . Anxiety   . Depression   . Abscess     history of multiple abscesses  . Anemia 2002    of chronic disease  . Obesity   . Peripheral neuropathy   . Abnormal Pap smear of cervix 2009  . Endocarditis 2002    subacute bacterial endocarditis.   . OSA on CPAP     non-compliant with home CPAP  . Edema of lower extremity   . CHF (congestive heart failure)   . Depression   .  Anxiety     Past Surgical History  Procedure Laterality Date  . Incision and drainage abscess      multiple I&Ds  . Irrigation and debridement abscess Left 07/06/2012    Procedure: IRRIGATION AND DEBRIDEMENT ABSCESS BUTTOCKS AND THIGH;  Surgeon: Shann Medal, MD;  Location: [place];  Service: General;  Laterality: Left;  . Incision and drainage of wound Left 07/07/2012    Procedure: IRRIGATION AND DEBRIDEMENT WOUND;  Surgeon: Harl Bowie, MD;  Location: [place];  Service: General;  Laterality: Left;  . Incision and drainage abscess Left 07/09/2012    Procedure: DRESSING CHANGE, THIGH WOUND;  Surgeon: Harl Bowie, MD;  Location: [place];  Service: General;  Laterality: Left;  . Incision and drainage perirectal abscess Left 07/14/2012    Procedure: DEBRIDEMENT OF SKIN &  SOFT TISSUE; DRESSING CHANGE UNDER ANESTHESIA;  Surgeon: Gayland Curry, MD,FACS;  Location: [place];  Service: General;  Laterality: Left;  . Incision and drainage perirectal abscess Left 07/16/2012    Procedure: I&D Left Thigh;  Surgeon: Gwenyth Ober, MD;  Location: [place];  Service: General;  Laterality: Left;  . Irrigation and debridement abscess Left 08/10/2012    Procedure: IRRIGATION AND DEBRIDEMENT ABSCESS;  Surgeon: Madilyn Hook, DO;  Location: [place];  Service: General;  Laterality: Left;  . Tonsillectomy  age 61    Family History:  Family History  Problem Relation Age of [place]  . Heart failure Mother   . Diabetes Mother   . Kidney disease Mother   . Kidney disease Father   . Diabetes Paternal Grandmother   . Heart failure Paternal Grandmother     Social History:  reports that she has been smoking Cigarettes.  She has been smoking about 1.00 pack per day. She does not have any smokeless tobacco history on file. She reports that she does not drink alcohol or use illicit drugs.  Additional Social History:  Alcohol / Drug Use Pain Medications: N/A Prescriptions: Pt reports only having insulin as a prescribed medication. Over the Counter: N/A History of alcohol / drug use?: No history of alcohol / drug abuse Longest period of sobriety (when/how long): N/A  CIWA: CIWA-Ar BP: 143/91 mmHg Pulse Rate: 83 COWS:    Allergies:  Allergies  Allergen Reactions  . Vancomycin     Acute renal failure suspected secondary to vanco    Home Medications:  (Not in a hospital admission)  OB/GYN Status:  No LMP recorded.  General Assessment Data Location of Assessment: [facility] ED Is this a Tele or Face-to-Face Assessment?: Tele Assessment Is this an Initial Assessment or a Re-assessment for this encounter?: Initial Assessment Living Arrangements: Parent (Living with mother) Can pt return to current living arrangement?: Yes Admission Status: Voluntary Is patient capable of signing voluntary  admission?: Yes Transfer from: [facility] Referral Source: MD     [place] Living Arrangements: Parent (Living with mother) Name of Psychiatrist: None Name of Therapist: None     Risk to self Suicidal Ideation: Yes-Currently Present Suicidal Intent: Yes-Currently Present Is patient at risk for suicide?: Yes Suicidal Plan?: Yes-Currently Present Specify Current Suicidal Plan: Overdose on her insulin Access to Means: Yes Specify Access to Suicidal Means: Has insulin for diabetes What has been your use of drugs/alcohol within the last 12 months?: Pt denies Previous Attempts/Gestures: Yes How many times?: 1 Other Self Harm Risks: None Triggers for Past Attempts: Unknown Intentional Self Injurious Behavior: None Family Suicide History: No Recent  stressful life event(s): Other (Comment) (Pt states, "everything") Persecutory voices/beliefs?: No Depression: Yes Depression Symptoms: Despondent;Insomnia;Tearfulness;Isolating;Loss of interest in usual pleasures;Feeling worthless/self pity Substance abuse history and/or treatment for substance abuse?: No Suicide prevention information given to non-admitted patients: Not applicable  Risk to Others Homicidal Ideation: No Thoughts of Harm to Others: No-Not Currently Present/Within Last 6 Months ("From time to time" thoughts of hurting others.) Current Homicidal Intent: No Current Homicidal Plan: No Access to Homicidal Means: No Identified Victim: No one History of harm to others?: No Assessment of Violence: None Noted Violent Behavior Description: Pt tearful but cooperative Does patient have access to weapons?: No Criminal Charges Pending?: No Does patient have a court date: No  Psychosis Hallucinations: None noted Delusions: None noted  Mental Status Report Appear/Hygiene: Disheveled Eye Contact: Poor Motor Activity: Freedom of movement Speech: Logical/coherent;Soft;Slow Level of Consciousness:  Quiet/awake Mood: Depressed;Sad;Despair Affect: Blunted;Depressed;Sad Anxiety Level: Panic Attacks Panic attack frequency: 1-2x/W (Situational, when she is in crowded places) Most recent panic attack: Today Thought Processes: Coherent;Relevant Judgement: Unimpaired Orientation: Person;Place;Time;Situation Obsessive Compulsive Thoughts/Behaviors: None  Cognitive Functioning Concentration: Decreased Memory: Recent Impaired;Remote Impaired IQ: Average Insight: Poor Impulse Control: Poor Appetite: Good Weight Loss: 0 Weight Gain: 0 Sleep: Decreased Total Hours of Sleep:  (<4H/D) Vegetative Symptoms: Staying in bed  ADLScreening [facility] Assessment Services) Patient's cognitive ability adequate to safely complete daily activities?: Yes Patient able to express need for assistance with ADLs?: Yes Independently performs ADLs?: Yes (appropriate for developmental age)  Prior Inpatient Therapy Prior Inpatient Therapy: Yes Prior Therapy Dates: 20 years ago Prior Therapy Facilty/Provider(s): [facility] Reason for Treatment: Depression, suicide attempt  Prior Outpatient Therapy Prior Outpatient Therapy: No Prior Therapy Dates: None currently Prior Therapy Facilty/Provider(s): None Reason for Treatment: N/A  ADL Screening (condition at time of admission) Patient's cognitive ability adequate to safely complete daily activities?: Yes Is the patient deaf or have difficulty hearing?: No Does the patient have difficulty seeing, even when wearing glasses/contacts?: No Does the patient have difficulty concentrating, remembering, or making decisions?: No Patient able to express need for assistance with ADLs?: Yes Does the patient have difficulty dressing or bathing?: No Independently performs ADLs?: Yes (appropriate for developmental age) Does the patient have difficulty walking or climbing stairs?: Yes (When she is having neuropathic pain.  Otherwise she is okay.) Weakness of Legs:  None Weakness of Arms/Hands: None       Abuse/Neglect Assessment (Assessment to be complete while patient is alone) Physical Abuse: Denies Verbal Abuse: Denies Sexual Abuse: Denies Exploitation of patient/patient's resources: Denies Self-Neglect: Denies     Regulatory affairs officer (For Healthcare) Advance Directive: Patient does not have advance directive;Patient would not like information    Additional Information 1:1 In Past 12 Months?: No CIRT Risk: No Elopement Risk: No Does patient have medical clearance?: Yes     Disposition:  Disposition Initial Assessment Completed for this Encounter: Yes Disposition of Patient: Inpatient treatment program;Referred to Type of inpatient treatment program: Adult Patient referred to:  (Needs to be run by extender when bed is available at [facility])  Curlene Dolphin Ray 02/27/2013 12:08 AM

## 2013-02-27 NOTE — ED Notes (Signed)
Pt up to the br with sitter

## 2013-02-27 NOTE — ED Notes (Signed)
Sitter at the bedside.  Pt sleeping

## 2013-02-27 NOTE — Progress Notes (Signed)
[facility]: Spoke with Clayton @ 1352 requested to fax referral for review, fax submitted.  [facility]: No beds available for detox Treatment   Mady Gemma,  MHT

## 2013-02-27 NOTE — ED Notes (Signed)
Pt refusing vitals at this time, requesting RN at this time

## 2013-02-27 NOTE — BH Assessment (Signed)
Per Aggie NP, Probation officer faxed written resources re: outpatient [place] facilities for pt. RN confirmed receipt of fax.  Arnold Long, [place] Assessment Counselor

## 2013-02-27 NOTE — Progress Notes (Signed)
THIS WRITER SPOKE WITH STAFF FROM [facility] TO INFORM THEM THAT AGGIE WILL BE CONDUCTING THE TELE PSYCH AT 1630.

## 2013-02-27 NOTE — ED Notes (Signed)
Pt sleeping sitter at the bedside

## 2013-02-27 NOTE — ED Notes (Signed)
Telepsych in progress. 

## 2013-02-27 NOTE — ED Notes (Signed)
Pt back to sleep

## 2013-02-27 NOTE — ED Provider Notes (Signed)
Patient was seen and evaluated by psychiatry team. I received a call and discussed the case with her practitioner from psychiatry. She's been seen and evaluated by psychiatrist. Recommendations were made for treatment for her anxiety with Prozac, BuSpar, and hydroxyzine. She is not suicidal or homicidal she is awake alert oriented lucid.  Lebron Quam, MD 02/27/13 [phone]

## 2013-02-28 NOTE — Consult Note (Signed)
Agree with plan 

## 2013-03-01 NOTE — Progress Notes (Signed)
I saw and evaluated the patient.  I personally confirmed the key portions of the history and exam documented by Dr. Komanski and I reviewed pertinent patient test results.  The assessment, diagnosis, and plan were formulated together and I agree with the documentation in the resident's note.  

## 2013-04-16 ENCOUNTER — Encounter: Payer: Self-pay | Admitting: Internal Medicine

## 2013-04-16 ENCOUNTER — Ambulatory Visit (INDEPENDENT_AMBULATORY_CARE_PROVIDER_SITE_OTHER): Payer: Self-pay | Admitting: Internal Medicine

## 2013-04-16 VITALS — BP 158/101 | HR 97 | Temp 98.7°F | Wt 299.2 lb

## 2013-04-16 DIAGNOSIS — E1310 Other specified diabetes mellitus with ketoacidosis without coma: Secondary | ICD-10-CM

## 2013-04-16 DIAGNOSIS — R45851 Suicidal ideations: Secondary | ICD-10-CM

## 2013-04-16 DIAGNOSIS — I10 Essential (primary) hypertension: Secondary | ICD-10-CM

## 2013-04-16 DIAGNOSIS — E1110 Type 2 diabetes mellitus with ketoacidosis without coma: Secondary | ICD-10-CM

## 2013-04-16 LAB — GLUCOSE, CAPILLARY: Glucose-Capillary: 446 mg/dL — ABNORMAL HIGH (ref 70–99)

## 2013-04-16 LAB — POCT GLYCOSYLATED HEMOGLOBIN (HGB A1C): Hemoglobin A1C: 12.5

## 2013-04-16 MED ORDER — INSULIN GLARGINE 100 UNIT/ML ~~LOC~~ SOLN: SUBCUTANEOUS | Status: DC

## 2013-04-16 MED ORDER — FLUOXETINE HCL 20 MG PO TABS: 40.0000 mg | ORAL_TABLET | Freq: Every day | ORAL | Status: DC

## 2013-04-16 MED ORDER — METFORMIN HCL 1000 MG PO TABS: 1000.0000 mg | ORAL_TABLET | Freq: Every day | ORAL | Status: DC

## 2013-04-16 MED ORDER — HYDROXYZINE HCL 50 MG PO TABS: 25.0000 mg | ORAL_TABLET | Freq: Three times a day (TID) | ORAL | Status: DC | PRN

## 2013-04-16 MED ORDER — BUSPIRONE HCL 10 MG PO TABS: 10.0000 mg | ORAL_TABLET | Freq: Two times a day (BID) | ORAL | Status: DC

## 2013-04-16 NOTE — Progress Notes (Signed)
Subjective:   Patient ID: [name] [name] female   DOB: September 12, 1975 37 y.o.   MRN: [id number]  HPI: [name] is a 37 y.o. female who was recently discharge from ED after a short stay for SI. She has not filled any prescriptions after leaving ED. She continues to have a depressed mood and affect. She reports intermittently taking her mothers insulin for DM. She requests to work on these issues at this visit. See problem based charting for further information.    Past Medical History  Diagnosis Date  . Diabetes mellitus     Type 2  . Hyperlipidemia   . Hypertension   . Anxiety   . Depression   . Abscess     history of multiple abscesses  . Anemia 2002    of chronic disease  . Obesity   . Peripheral neuropathy   . Abnormal Pap smear of cervix 2009  . Endocarditis 2002    subacute bacterial endocarditis.   . OSA on CPAP     non-compliant with home CPAP  . Edema of lower extremity   . CHF (congestive heart failure)   . Depression   . Anxiety    Current Outpatient Prescriptions  Medication Sig Dispense Refill  . busPIRone (BUSPAR) 10 MG tablet Take 1 tablet (10 mg total) by mouth 2 (two) times daily.  60 tablet  0  . FLUoxetine (PROZAC) 20 MG tablet Take 2 tablets (40 mg total) by mouth daily.  60 tablet  0  . hydrOXYzine (ATARAX/VISTARIL) 50 MG tablet Take 0.5 tablets (25 mg total) by mouth 3 (three) times daily as needed for itching or anxiety.  60 tablet  0  . insulin aspart (NOVOLOG) 100 UNIT/ML injection Inject 3-7 Units into the skin 3 (three) times daily with meals. *per sliding scale      . insulin glargine (LANTUS) 100 UNIT/ML injection Take 10 U once every morning.  10 mL  3  . metFORMIN (GLUCOPHAGE) 1000 MG tablet Take 1 tablet (1,000 mg total) by mouth daily with breakfast.  30 tablet  11   No current facility-administered medications for this visit.   Family History  Problem Relation Age of Onset  . Heart failure Mother   . Diabetes Mother     . Kidney disease Mother   . Kidney disease Father   . Diabetes Paternal Grandmother   . Heart failure Paternal Grandmother    History   Social History  . Marital Status: Single    Spouse Name: N/A    Number of Children: N/A  . Years of Education: N/A   Occupational History  . telemarketer     Phone surveys   Social History Main Topics  . Smoking status: Current Every Day Smoker -- 0.50 packs/day    Types: Cigarettes  . Smokeless tobacco: None  . Alcohol Use: No  . Drug Use: No  . Sexual Activity: None   Other Topics Concern  . None   Social History Narrative   Lives with mother currently,    Review of Systems: Review of Systems  Constitutional: Negative for fever, chills, weight loss, malaise/fatigue and diaphoresis.  HENT: Negative for sore throat.   Eyes: Negative for blurred vision, double vision and photophobia.  Respiratory: Negative for cough, sputum production and shortness of breath.   Cardiovascular: Negative for chest pain, palpitations, orthopnea, claudication and leg swelling.  Gastrointestinal: Negative for nausea, vomiting, abdominal pain, diarrhea and constipation.  Genitourinary: Negative for dysuria, urgency and  frequency.  Musculoskeletal: Negative for back pain, joint pain, myalgias and neck pain.  Skin: Negative for rash.  Neurological: Negative for weakness and headaches.  Psychiatric/Behavioral: Positive for depression. Negative for suicidal ideas, hallucinations, memory loss and substance abuse. The patient is nervous/anxious and has insomnia.     Objective:  Physical Exam: Filed Vitals:   04/16/13 1456  BP: 158/101  Pulse: 97  Temp: 98.7 F (37.1 C)  TempSrc: Oral  Weight: 299 lb 3.2 oz (135.716 kg)  SpO2: 98%   Physical Exam  Constitutional: She is oriented to person, place, and time. She appears well-developed and well-nourished. No distress.  HENT:  Head: Normocephalic and atraumatic.  Mouth/Throat: Oropharynx is clear and  moist. No oropharyngeal exudate.  Eyes: EOM are normal. Pupils are equal, round, and reactive to light.  Cardiovascular: Normal rate, regular rhythm, normal heart sounds and intact distal pulses.  Exam reveals no gallop and no friction rub.   No murmur heard. Pulmonary/Chest: Effort normal and breath sounds normal. No respiratory distress. She has no wheezes. She has no rales. She exhibits no tenderness.  Abdominal: Soft. Bowel sounds are normal. She exhibits no distension and no mass. There is no tenderness. There is no rebound and no guarding. No hernia.  Neurological: She is alert and oriented to person, place, and time.  Skin: She is not diaphoretic.  Psychiatric: Thought content normal.  Depressed mood and affect. Denies HI/SI    Assessment & Plan:

## 2013-04-16 NOTE — Patient Instructions (Signed)
Please take Lantus insulin 10 U once daily. Please take metformin once daily 1000 mg.  Please take the Vistaril, Prozac, and Buspirone for depression and anxiety. Please continue to work with [place].  Follow up with Dr. Margart Sickles in [facility] December

## 2013-04-17 NOTE — Assessment & Plan Note (Signed)
BP Readings from Last 3 Encounters:  04/16/13 158/101  02/27/13 137/95  01/03/13 143/97    Lab Results  Component Value Date   NA 136 02/26/2013   K 4.1 02/26/2013   CREATININE 0.87 02/26/2013    Assessment: Blood pressure control: mildly elevated Progress toward BP goal:  unchanged Comments: Will recheck BP at next visit.  Plan: Medications:  Not on current meds due to financial problems. I elected to focus on DM this vist at [facility] was 12.5%. Will address HTN at future visit. Educational resources provided: brochure

## 2013-04-17 NOTE — Assessment & Plan Note (Signed)
Patient has not restarted psych meds due to financial stress. She contineus to have depressed mood and affect as well as anxiety. She is not actively suicidal or homicidal. She has started seeing a Management consultant at [facility]. She plans to meet with them on Dec 5th to work on getting her prescriptions paid for. I wrote her prescriptions for the medications that were prescribed at discharge from the ED.

## 2013-04-17 NOTE — Assessment & Plan Note (Addendum)
Lab Results  Component Value Date   HGBA1C 12.5 04/16/2013   HGBA1C 15.4* 07/06/2012   HGBA1C 10.5 01/02/2012     Assessment: Diabetes control: poor control (HgbA1C >9%) Progress toward A1C goal:  deteriorated Comments: Patient has been using her mothers insulin intermittently because she could not afford it. She requires assistance with getting medications.  Plan: Medications:  Switched to Lantus 10 U daily and metforming 1000 mg daily for simplification. I gave 1000 U of sample insulin along with syringes. Home glucose monitoring: Frequency: once a day Timing: before breakfast Instruction/counseling given: reminded to bring blood glucose meter & log to each visit, reminded to bring medications to each visit and discussed the need for weight loss Educational resources provided: brochure Self management tools provided:   Other plans: Will reassess at close f/u

## 2013-04-23 NOTE — Progress Notes (Signed)
At the time when I met this patient, she appeared non-engaging, avoided eye contact, and provided short staccato answers. She was not suicidal or homicidal; however she did not want to discuss her problems in detail.  We managed to engage her in a conversation about her diabetes, however it was not possible to touch all the health issues with this patient at the time of the visit. Therefore, her moderate elevation of blood pressure (with no red flag symptoms) will be addressed next visit - which cannot be done with Dr. Margart Sickles as his schedule for 12/16 is full. I will call the front dest and ask them to schedule this patient asap with any resident available as she needs close follow up.   I saw and evaluated the patient.  I personally confirmed the key portions of the history and exam documented by Dr. Margart Sickles and I reviewed pertinent patient test results.  The assessment, diagnosis, and plan were formulated together and I agree with the documentation in the resident's note.

## 2013-04-25 ENCOUNTER — Encounter: Payer: Self-pay | Admitting: Internal Medicine

## 2013-04-25 ENCOUNTER — Ambulatory Visit (INDEPENDENT_AMBULATORY_CARE_PROVIDER_SITE_OTHER): Payer: Self-pay | Admitting: Internal Medicine

## 2013-04-25 VITALS — BP 134/87 | HR 88 | Temp 98.1°F | Ht 70.0 in | Wt 307.8 lb

## 2013-04-25 DIAGNOSIS — Z598 Other problems related to housing and economic circumstances: Secondary | ICD-10-CM

## 2013-04-25 DIAGNOSIS — Z599 Problem related to housing and economic circumstances, unspecified: Secondary | ICD-10-CM | POA: Insufficient documentation

## 2013-04-25 DIAGNOSIS — E038 Other specified hypothyroidism: Secondary | ICD-10-CM | POA: Insufficient documentation

## 2013-04-25 DIAGNOSIS — E039 Hypothyroidism, unspecified: Secondary | ICD-10-CM

## 2013-04-25 DIAGNOSIS — Z23 Encounter for immunization: Secondary | ICD-10-CM

## 2013-04-25 DIAGNOSIS — E1110 Type 2 diabetes mellitus with ketoacidosis without coma: Secondary | ICD-10-CM

## 2013-04-25 DIAGNOSIS — F329 Major depressive disorder, single episode, unspecified: Secondary | ICD-10-CM

## 2013-04-25 DIAGNOSIS — E785 Hyperlipidemia, unspecified: Secondary | ICD-10-CM

## 2013-04-25 LAB — LIPID PANEL
Cholesterol: 186 mg/dL (ref 0–200)
LDL Cholesterol: 131 mg/dL — ABNORMAL HIGH (ref 0–99)
Total CHOL/HDL Ratio: 4.2 Ratio
VLDL: 11 mg/dL (ref 0–40)

## 2013-04-25 LAB — T4, FREE: Free T4: 1.16 ng/dL (ref 0.80–1.80)

## 2013-04-25 LAB — TSH: TSH: 5.345 u[IU]/mL — ABNORMAL HIGH (ref 0.350–4.500)

## 2013-04-25 NOTE — Assessment & Plan Note (Signed)
Lab Results  Component Value Date   HGBA1C 12.5 04/16/2013   HGBA1C 15.4* 07/06/2012   HGBA1C 10.5 01/02/2012     Assessment: Diabetes control: poor control (HgbA1C >9%) Progress toward A1C goal:  deteriorated She has just started insulin treatment over a week.  Reports medical compliance with Lantus but not NovoLog.  Has not started metformin due to financial difficulty.  Plan: Medications:  continue current medications Home glucose monitoring: Not checking blood sugars Frequency:   Timing:   Instruction/counseling given: reminded to get eye exam, reminded to bring blood glucose meter & log to each visit, reminded to bring medications to each visit, discussed foot care, discussed the need for weight loss and discussed diet Educational resources provided:   Self management tools provided:   Other plans:  - Continue current Lantus and NovoLog therapy since she has just started a week ago.  - Will not be safe to make any adjustment without more blood sugar data. - Patient is instructed to apply ID ASAP so we can help her apply for the orange card, which ultimately help her with her medication payment.

## 2013-04-25 NOTE — Assessment & Plan Note (Signed)
Assessment Patient admits depressed mood but denies SI/HI.  Has not taking any of her anti-depression medications due to financial difficulty.  Plan -Just continue to followup with counselor at [facility] the month on 05/03/13, and hopefully she could get her psychiatric medication patient for. -She is instructed to apply for her ID ASAP so we could help her with her medical care. -She is instructed to call 911 or go to the ED if she experiences suicidal/homicidal ideation. -Patient understands above instructions.

## 2013-04-25 NOTE — Assessment & Plan Note (Signed)
Assessment  Patient could not apply for the orange card since she does not have a valid ID.  When asked the plans to apply for a new ID, she states that she has not thought of it. Patient is scheduled to discuss orange card with Hilda Blades today. She states that she could not afford any of her medications since she does not have income.     Plan - Patient is instructed to apply for a valid ID ASAP - Scheduled to followup with Debra for application of orange card -Patient understand the importance of compliance with her application for valid ID.

## 2013-04-25 NOTE — Patient Instructions (Signed)
1. Please follow up with [name] [name] on application for your ID and orange card. 2. You have an appt with [name] [name] on 05/03/13 3. Please make sure that you apply your ID asap. 4. followup in one month.   Hypothyroidism The thyroid is a large gland located in the lower front of your neck. The thyroid gland helps control metabolism. Metabolism is how your body handles food. It controls metabolism with the hormone thyroxine. When this gland is underactive (hypothyroid), it produces too little hormone.  CAUSES These include:   Absence or destruction of thyroid tissue.  Goiter due to iodine deficiency.  Goiter due to medications.  Congenital defects (since birth).  Problems with the pituitary. This causes a lack of TSH (thyroid stimulating hormone). This hormone tells the thyroid to turn out more hormone. SYMPTOMS  Lethargy (feeling as though you have no energy)  Cold intolerance  Weight gain (in spite of normal food intake)  Dry skin  Coarse hair  Menstrual irregularity (if severe, may lead to infertility)  Slowing of thought processes Cardiac problems are also caused by insufficient amounts of thyroid hormone. Hypothyroidism in the newborn is cretinism, and is an extreme form. It is important that this form be treated adequately and immediately or it will lead rapidly to retarded physical and mental development. DIAGNOSIS  To prove hypothyroidism, your caregiver may do blood tests and ultrasound tests. Sometimes the signs are hidden. It may be necessary for your caregiver to watch this illness with blood tests either before or after diagnosis and treatment. TREATMENT  Low levels of thyroid hormone are increased by using synthetic thyroid hormone. This is a safe, effective treatment. It usually takes about four weeks to gain the full effects of the medication. After you have the full effect of the medication, it will generally take another four weeks for problems to leave. Your caregiver may  start you on low doses. If you have had heart problems the dose may be gradually increased. It is generally not an emergency to get rapidly to normal. HOME CARE INSTRUCTIONS   Take your medications as your caregiver suggests. Let your caregiver know of any medications you are taking or start taking. Your caregiver will help you with dosage schedules.  As your condition improves, your dosage needs may increase. It will be necessary to have continuing blood tests as suggested by your caregiver.  Report all suspected medication side effects to your caregiver. SEEK MEDICAL CARE IF: Seek medical care if you develop:  Sweating.  Tremulousness (tremors).  Anxiety.  Rapid weight loss.  Heat intolerance.  Emotional swings.  Diarrhea.  Weakness. SEEK IMMEDIATE MEDICAL CARE IF:  You develop chest pain, an irregular heart beat (palpitations), or a rapid heart beat. MAKE SURE YOU:   Understand these instructions.  Will watch your condition.  Will get help right away if you are not doing well or get worse. Document Released: 05/09/2005 Document Revised: 08/01/2011 Document Reviewed: 12/28/2007 [facility] Patient Information 2014 [place].

## 2013-04-25 NOTE — Progress Notes (Signed)
Patient: [name] [name]   MRN: [id number]  DOB: 24-Jul-1975  PCP: Marrion Coy, MD   Subjective:    CC: Diabetes   HPI: [name] [name] is a 37 y.o. female with a PMHxof uncontrolled T2DM, uncontrolled hypothyroidism, HTN. HLD, Obesity, OSA on CPAP, Anxiety and depression, who presented to clinic today for the follow up visit.  1) Financial difficulty   Patient could not apply for the orange card since she does not have a valid ID.  When asked the plans to apply for a new ID, she states that she has not thought of it. Patient is scheduled to discuss orange card with Hilda Blades today. She states that she could not afford any of her medications since she does not have income.     1) T2DM, uncontrolled. A1C 12.5 on 04/16/13.      She has uncontrolled type 2 diabetes due to medical noncompliance.  She has been intermittently taking her mother's insulin until 1 week ago when she was given insulin sample along with syringes at her clinic [facility]. She states that she had been compliant with her Lantus, but still taking her Novolog prandial coverage intermittently.  She has not started metformin due to financial difficulty.   2) Depression    She admits depressed mood but denies SI/HI.  She has not started BuSpar or Prozac due to financial difficulty.  She is scheduled to followup with counselor at [facility] at [facility] on 05/03/2013, and she may be able to get her psychiatric medications paid for.  3) Hypothyroidism, last TCH 19, Free T 4 0.80 in April 2014.   Patient reports depressed mood, lethargy/fatigue and weight gain. She has not been taking her Synthroid due to financial difficulty.      # Health maintenance - Will check lipid panel and TFT today - Will give influenza vaccine today Review of Systems: Per HPI.   Current Outpatient Medications: Current Outpatient Prescriptions  Medication Sig Dispense Refill  . insulin aspart (NOVOLOG) 100 UNIT/ML injection Inject  3-7 Units into the skin 3 (three) times daily with meals. *per sliding scale      . insulin glargine (LANTUS) 100 UNIT/ML injection Take 10 U once every morning.  10 mL  3  . busPIRone (BUSPAR) 10 MG tablet Take 1 tablet (10 mg total) by mouth 2 (two) times daily.  60 tablet  0  . FLUoxetine (PROZAC) 20 MG tablet Take 2 tablets (40 mg total) by mouth daily.  60 tablet  0  . hydrOXYzine (ATARAX/VISTARIL) 50 MG tablet Take 0.5 tablets (25 mg total) by mouth 3 (three) times daily as needed for itching or anxiety.  60 tablet  0  . metFORMIN (GLUCOPHAGE) 1000 MG tablet Take 1 tablet (1,000 mg total) by mouth daily with breakfast.  30 tablet  11   No current facility-administered medications for this visit.    Allergies: Allergies  Allergen Reactions  . Vancomycin     Acute renal failure suspected secondary to vanco    Past Medical History  Diagnosis Date  . Diabetes mellitus     Type 2  . Hyperlipidemia   . Hypertension   . Anxiety   . Depression   . Abscess     history of multiple abscesses  . Anemia 2002    of chronic disease  . Obesity   . Peripheral neuropathy   . Abnormal Pap smear of cervix 2009  . Endocarditis 2002    subacute bacterial endocarditis.   Marland Kitchen  OSA on CPAP     non-compliant with home CPAP  . Edema of lower extremity   . CHF (congestive heart failure)   . Depression   . Anxiety     Objective:    Physical Exam: Filed Vitals:   04/25/13 1004  BP: 134/87  Pulse: 88  Temp: 98.1 F (36.7 C)  TempSrc: Oral  Height: 5\' 10"  (1.778 m)  Weight: 307 lb 12.8 oz (139.617 kg)  SpO2: 100%    Morbid obesity General: Vital signs reviewed and noted.  Appearing non--engaging, avoiding eye contact, and  providing short staccato answers  Head: Normocephalic, atraumatic.  Lungs:  Normal respiratory effort. Clear to auscultation BL without crackles or wheezes.  Heart: RRR. S1 and S2 normal without gallop, rubs, murmur.  Abdomen:  BS normoactive. Soft, Nondistended,  non-tender.  No masses or organomegaly.  Extremities: No pretibial edema.   Assessment/ Plan:

## 2013-04-25 NOTE — Assessment & Plan Note (Addendum)
Assessment Patient reports depressed mood, lethargic/fatigue and weight gain which all could relate to her uncontrolled/untreated hypothyroidism.  She stated she could not get medications due to financial difficulty.  -Plan -Will recheck her TFT given her above symptoms. -Patient is instructed to apply for ID Asap so we could help with her medical care  Addendum: -TFT is better without medical treatment. With her normal Free T4 and slightly elevated TSH. I would not recs medical treatment at this point. We will consider recheck TFT in 3-6 months.

## 2013-04-26 NOTE — Progress Notes (Signed)
Case discussed with Dr. Li soon after the resident saw the patient. We reviewed the resident's history and exam and pertinent patient test results. I agree with the assessment, diagnosis, and plan of care documented in the resident's note. 

## 2013-05-03 ENCOUNTER — Ambulatory Visit: Payer: Self-pay

## 2013-05-03 IMAGING — US US RENAL PORT
1 series · 14 of 25 positions shown · non-contrast
Comparison: None.

CLINICAL DATA: Acute renal failure.

RENAL/URINARY TRACT ULTRASOUND COMPLETE

[Series 1: us renal port · 0.33mm/px · 14 of 42 slices shown]
[im 1/42]
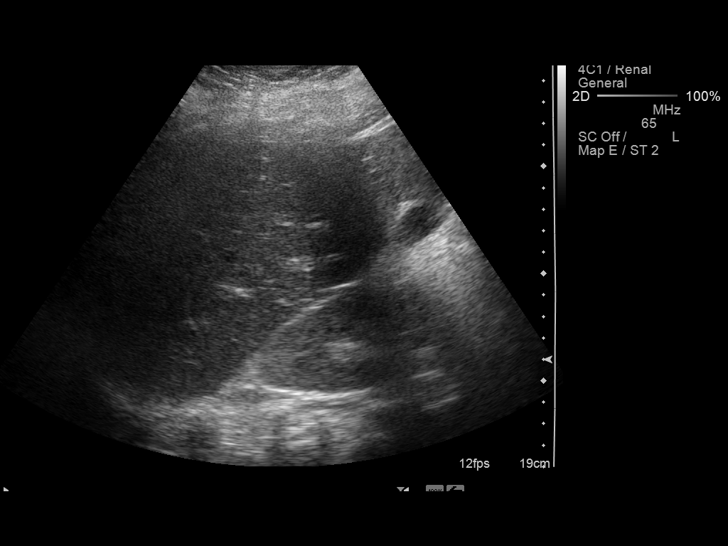
[im 4/42]
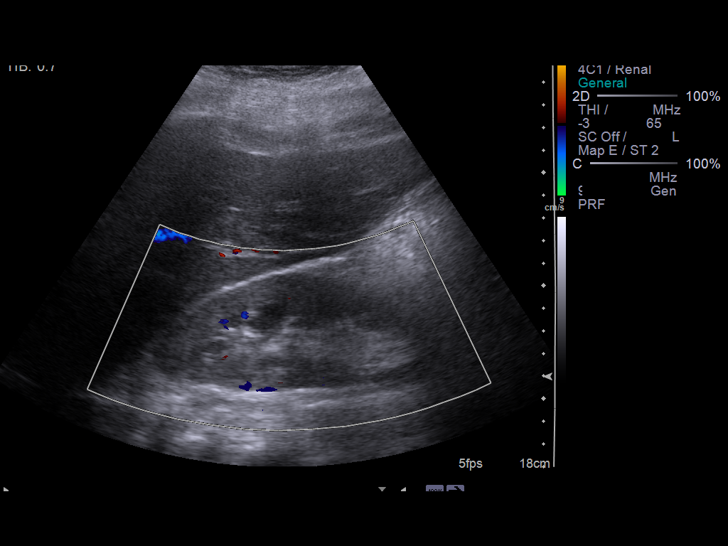
[im 7/42]
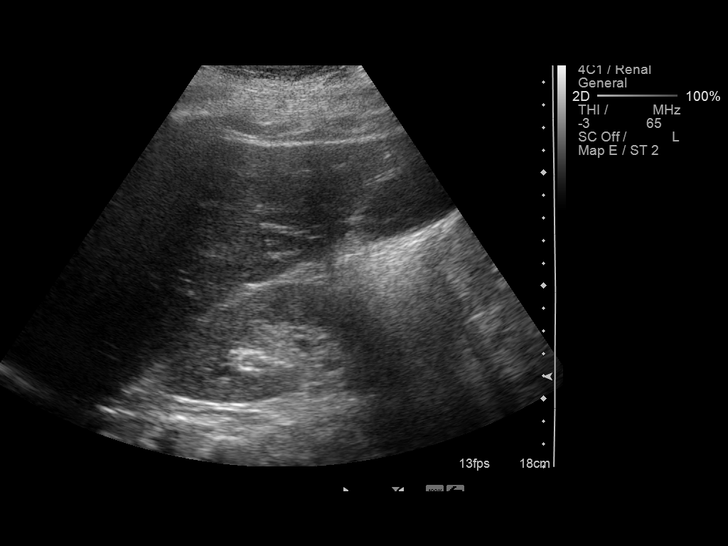
[im 11/42]
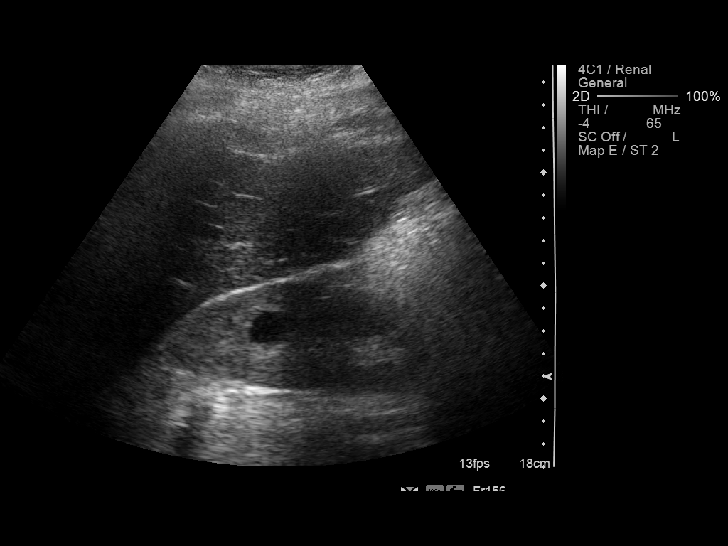
[im 14/42]
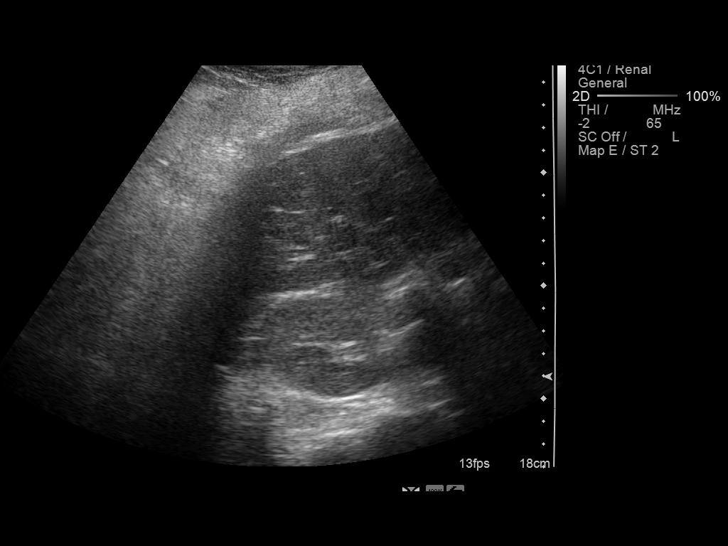
[im 16/42]
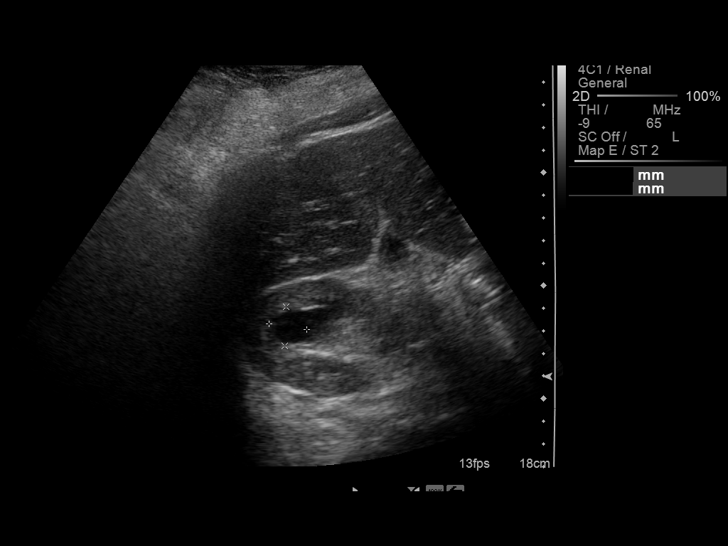
[im 19/42]
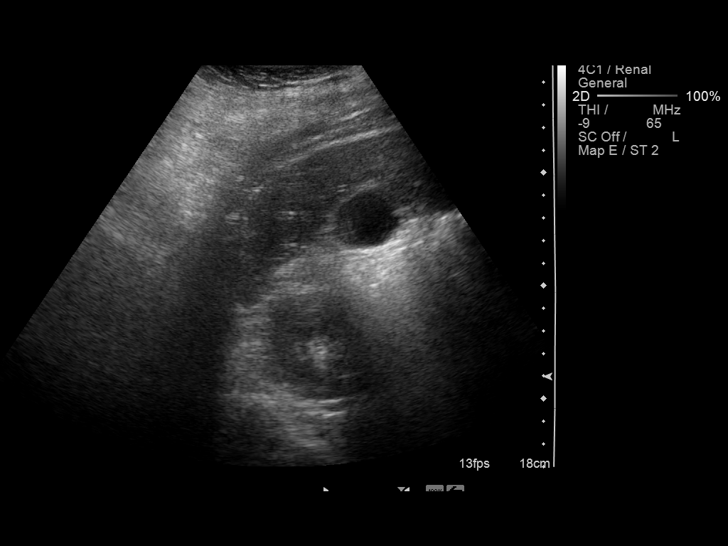
[im 23/42]
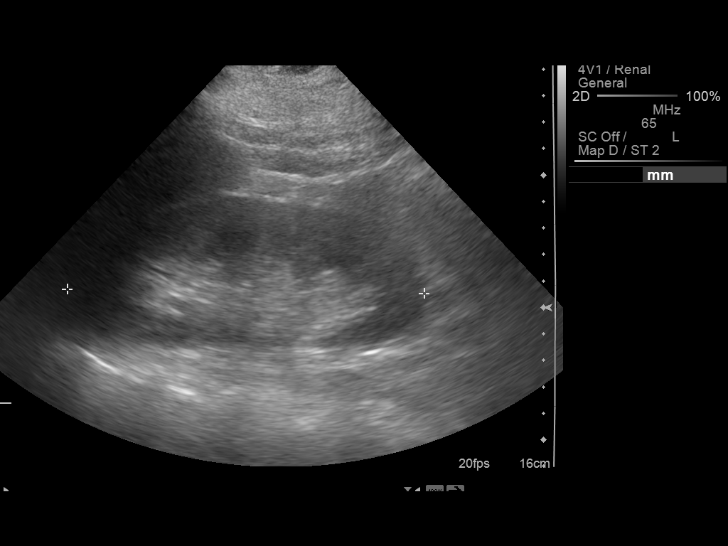
[im 26/42]
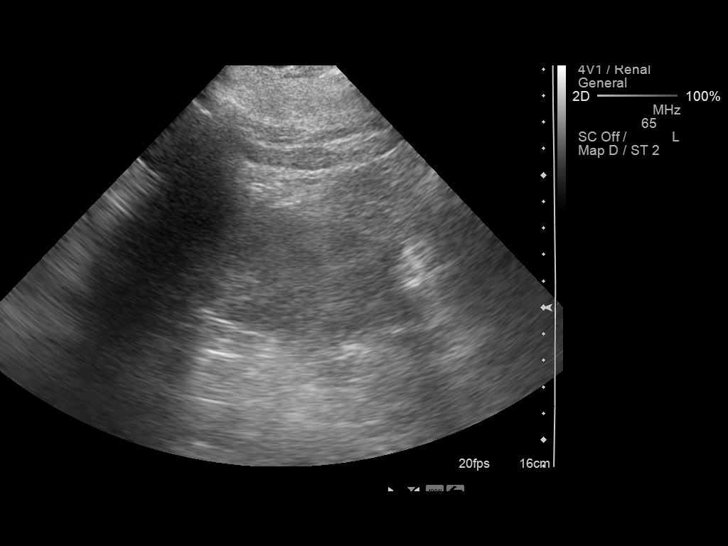
[im 28/42]
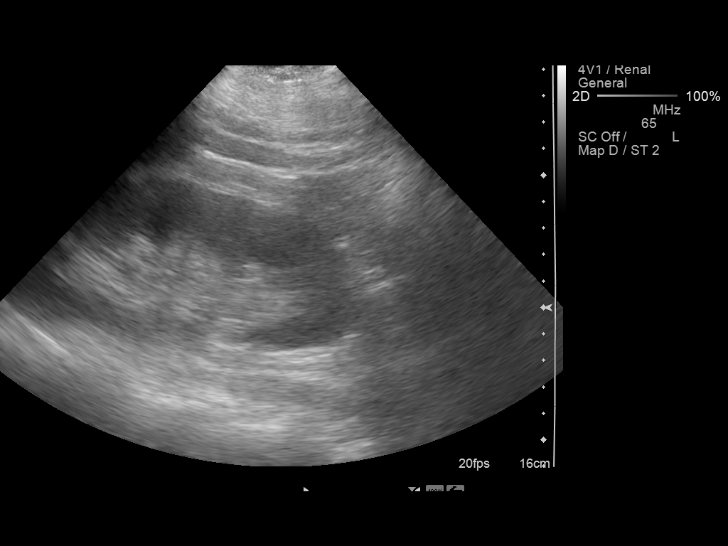
[im 31/42]
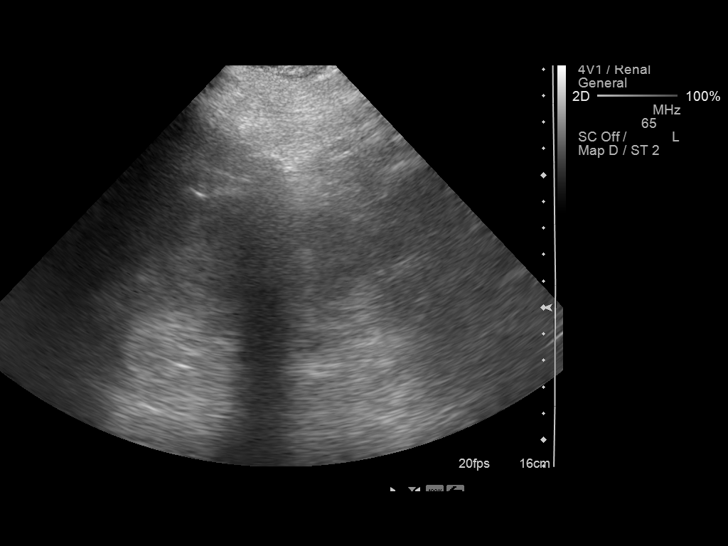
[im 35/42]
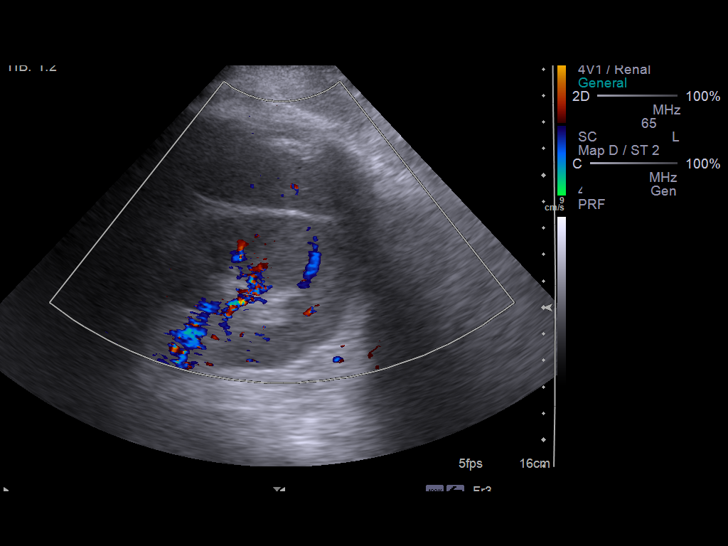
[im 38/42]
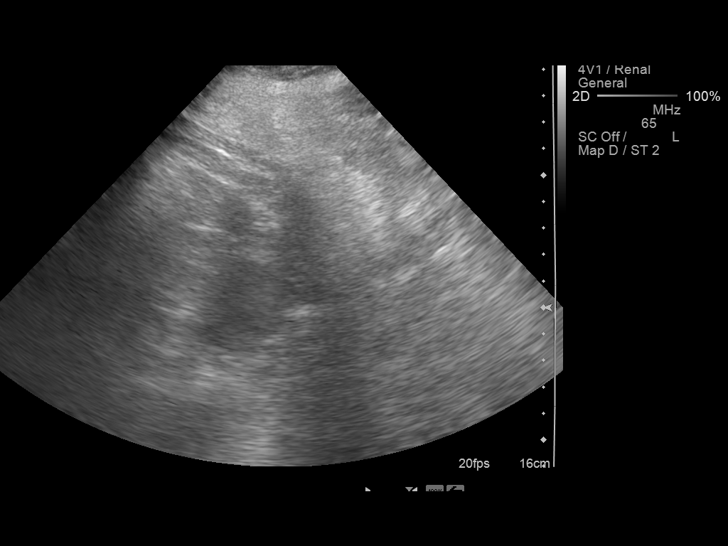
[im 42/42]
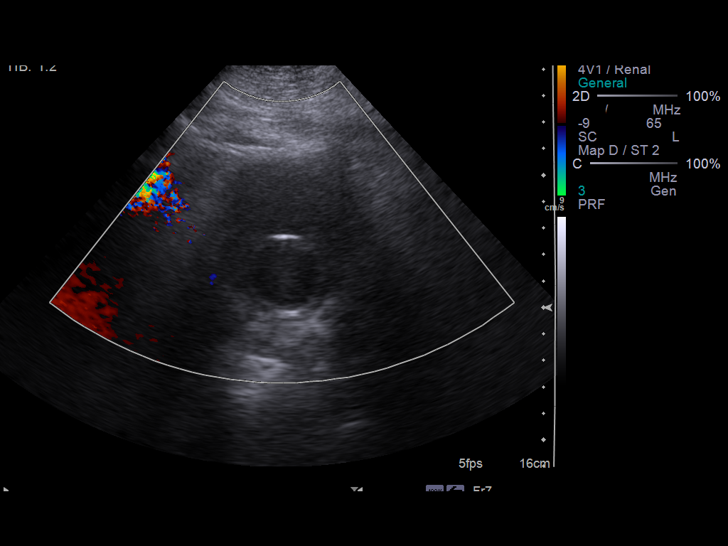

[14 of 25 positions shown; findings below may reference images not displayed]

FINDINGS: Right Kidney:  12.8 cm.  No obstruction or hydronephrosis.  Simple
renal sinus cyst is present measuring 17 mm.

Left Kidney:  13.5 cm. Normal echotexture.  Normal central sinus
echo complex.  No calculi or hydronephrosis.

Bladder:  Decompressed bladder, as such not evaluated.  Foley
catheter present.
IMPRESSION: Right renal cyst.  No hydronephrosis or obstruction.  Foley
catheter.

## 2013-05-13 ENCOUNTER — Observation Stay (HOSPITAL_COMMUNITY): Payer: No Typology Code available for payment source

## 2013-05-13 ENCOUNTER — Ambulatory Visit (INDEPENDENT_AMBULATORY_CARE_PROVIDER_SITE_OTHER): Payer: No Typology Code available for payment source | Admitting: Internal Medicine

## 2013-05-13 ENCOUNTER — Encounter (HOSPITAL_COMMUNITY): Payer: Self-pay | Admitting: General Practice

## 2013-05-13 ENCOUNTER — Inpatient Hospital Stay (HOSPITAL_COMMUNITY)
Admission: AD | Admit: 2013-05-13 | Discharge: 2013-05-15 | DRG: 603 | Disposition: A | Discharge status: Home / Self Care | Payer: No Typology Code available for payment source | Source: Ambulatory Visit | Attending: Internal Medicine | Admitting: Internal Medicine

## 2013-05-13 ENCOUNTER — Encounter: Payer: Self-pay | Admitting: Internal Medicine

## 2013-05-13 VITALS — BP 150/91 | HR 83 | Temp 97.4°F | Ht 70.0 in | Wt 306.6 lb

## 2013-05-13 DIAGNOSIS — F329 Major depressive disorder, single episode, unspecified: Secondary | ICD-10-CM | POA: Diagnosis present

## 2013-05-13 DIAGNOSIS — A4902 Methicillin resistant Staphylococcus aureus infection, unspecified site: Secondary | ICD-10-CM | POA: Diagnosis present

## 2013-05-13 DIAGNOSIS — E1159 Type 2 diabetes mellitus with other circulatory complications: Secondary | ICD-10-CM | POA: Diagnosis present

## 2013-05-13 DIAGNOSIS — G4733 Obstructive sleep apnea (adult) (pediatric): Secondary | ICD-10-CM | POA: Diagnosis present

## 2013-05-13 DIAGNOSIS — E038 Other specified hypothyroidism: Secondary | ICD-10-CM | POA: Diagnosis present

## 2013-05-13 DIAGNOSIS — F17200 Nicotine dependence, unspecified, uncomplicated: Secondary | ICD-10-CM | POA: Diagnosis present

## 2013-05-13 DIAGNOSIS — E039 Hypothyroidism, unspecified: Secondary | ICD-10-CM | POA: Diagnosis present

## 2013-05-13 DIAGNOSIS — G8929 Other chronic pain: Secondary | ICD-10-CM | POA: Diagnosis present

## 2013-05-13 DIAGNOSIS — L089 Local infection of the skin and subcutaneous tissue, unspecified: Secondary | ICD-10-CM | POA: Insufficient documentation

## 2013-05-13 DIAGNOSIS — L02219 Cutaneous abscess of trunk, unspecified: Secondary | ICD-10-CM

## 2013-05-13 DIAGNOSIS — Z91199 Patient's noncompliance with other medical treatment and regimen due to unspecified reason: Secondary | ICD-10-CM

## 2013-05-13 DIAGNOSIS — F411 Generalized anxiety disorder: Secondary | ICD-10-CM | POA: Diagnosis present

## 2013-05-13 DIAGNOSIS — F319 Bipolar disorder, unspecified: Secondary | ICD-10-CM | POA: Diagnosis present

## 2013-05-13 DIAGNOSIS — E669 Obesity, unspecified: Secondary | ICD-10-CM | POA: Diagnosis present

## 2013-05-13 DIAGNOSIS — Z598 Other problems related to housing and economic circumstances: Secondary | ICD-10-CM

## 2013-05-13 DIAGNOSIS — Z9119 Patient's noncompliance with other medical treatment and regimen: Secondary | ICD-10-CM

## 2013-05-13 DIAGNOSIS — I10 Essential (primary) hypertension: Secondary | ICD-10-CM | POA: Diagnosis present

## 2013-05-13 DIAGNOSIS — Z8249 Family history of ischemic heart disease and other diseases of the circulatory system: Secondary | ICD-10-CM

## 2013-05-13 DIAGNOSIS — L03319 Cellulitis of trunk, unspecified: Secondary | ICD-10-CM

## 2013-05-13 DIAGNOSIS — E1110 Type 2 diabetes mellitus with ketoacidosis without coma: Secondary | ICD-10-CM

## 2013-05-13 DIAGNOSIS — Z5987 Material hardship due to limited financial resources, not elsewhere classified: Secondary | ICD-10-CM

## 2013-05-13 DIAGNOSIS — Z833 Family history of diabetes mellitus: Secondary | ICD-10-CM

## 2013-05-13 DIAGNOSIS — Z881 Allergy status to other antibiotic agents status: Secondary | ICD-10-CM

## 2013-05-13 DIAGNOSIS — IMO0001 Reserved for inherently not codable concepts without codable children: Secondary | ICD-10-CM | POA: Diagnosis present

## 2013-05-13 DIAGNOSIS — I509 Heart failure, unspecified: Secondary | ICD-10-CM | POA: Diagnosis present

## 2013-05-13 DIAGNOSIS — K219 Gastro-esophageal reflux disease without esophagitis: Secondary | ICD-10-CM | POA: Diagnosis present

## 2013-05-13 DIAGNOSIS — E1122 Type 2 diabetes mellitus with diabetic chronic kidney disease: Secondary | ICD-10-CM | POA: Diagnosis present

## 2013-05-13 DIAGNOSIS — E785 Hyperlipidemia, unspecified: Secondary | ICD-10-CM | POA: Diagnosis present

## 2013-05-13 DIAGNOSIS — Z6841 Body Mass Index (BMI) 40.0 and over, adult: Secondary | ICD-10-CM

## 2013-05-13 HISTORY — DX: Personal history of other medical treatment: Z92.89

## 2013-05-13 HISTORY — DX: Bipolar disorder, unspecified: F31.9

## 2013-05-13 HISTORY — DX: Gastro-esophageal reflux disease without esophagitis: K21.9

## 2013-05-13 HISTORY — DX: Family history of other specified conditions: Z84.89

## 2013-05-13 HISTORY — DX: Type 2 diabetes mellitus without complications: E11.9

## 2013-05-13 HISTORY — DX: Other chronic pain: G89.29

## 2013-05-13 HISTORY — DX: Anemia in other chronic diseases classified elsewhere: D63.8

## 2013-05-13 HISTORY — DX: Cardiac murmur, unspecified: R01.1

## 2013-05-13 HISTORY — DX: Hypothyroidism, unspecified: E03.9

## 2013-05-13 HISTORY — DX: Unspecified chronic bronchitis: J42

## 2013-05-13 LAB — COMPREHENSIVE METABOLIC PANEL
Alkaline Phosphatase: 90 U/L (ref 39–117)
BUN: 13 mg/dL (ref 6–23)
CO2: 30 mEq/L (ref 19–32)
Calcium: 8.8 mg/dL (ref 8.4–10.5)
Chloride: 96 mEq/L (ref 96–112)
GFR calc Af Amer: >90 mL/min (ref >90)
Glucose, Bld: 338 mg/dL — ABNORMAL HIGH (ref 70–99)
Potassium: 4 mEq/L (ref 3.5–5.1)
Total Bilirubin: 0.2 mg/dL — ABNORMAL LOW (ref 0.3–1.2)

## 2013-05-13 LAB — CBC WITH DIFFERENTIAL/PLATELET
Basophils Absolute: 0 10*3/uL (ref 0.0–0.1)
Basophils Relative: 0 % (ref 0–1)
Eosinophils Absolute: 0.1 10*3/uL (ref 0.0–0.7)
MCHC: 34.2 g/dL (ref 30.0–36.0)
Monocytes Absolute: 0.4 10*3/uL (ref 0.1–1.0)
Neutro Abs: 2.2 10*3/uL (ref 1.7–7.7)
Neutrophils Relative %: 40 % — ABNORMAL LOW (ref 43–77)
RDW: 12.2 % (ref 11.5–15.5)

## 2013-05-13 LAB — MAGNESIUM: Magnesium: 2 mg/dL (ref 1.5–2.5)

## 2013-05-13 LAB — MRSA PCR SCREENING: MRSA by PCR: NEGATIVE

## 2013-05-13 MED ORDER — HYDROCODONE-ACETAMINOPHEN 5-325 MG PO TABS
1.0000 | ORAL_TABLET | Freq: Four times a day (QID) | ORAL | Status: DC | PRN
  Administered 2013-05-13 (×2): 1 via ORAL
  Filled 2013-05-13 (×2): qty 1

## 2013-05-13 MED ORDER — SIMVASTATIN 20 MG PO TABS
20.0000 mg | ORAL_TABLET | Freq: Every day | ORAL | Status: DC
  Administered 2013-05-13 – 2013-05-14 (×2): 20 mg via ORAL
  Filled 2013-05-13 (×4): qty 1

## 2013-05-13 MED ORDER — LISINOPRIL 5 MG PO TABS
5.0000 mg | ORAL_TABLET | Freq: Every day | ORAL | Status: DC
  Administered 2013-05-13 – 2013-05-15 (×3): 5 mg via ORAL
  Filled 2013-05-13 (×4): qty 1

## 2013-05-13 MED ORDER — HYDROCODONE-ACETAMINOPHEN 5-325 MG PO TABS
1.0000 | ORAL_TABLET | Freq: Once | ORAL | Status: AC
  Administered 2013-05-13: 1 via ORAL
  Filled 2013-05-13: qty 1

## 2013-05-13 MED ORDER — INSULIN GLARGINE 100 UNIT/ML ~~LOC~~ SOLN
15.0000 [IU] | Freq: Every day | SUBCUTANEOUS | Status: DC
  Administered 2013-05-13 – 2013-05-14 (×2): 15 [IU] via SUBCUTANEOUS
  Filled 2013-05-13 (×3): qty 0.15

## 2013-05-13 MED ORDER — ENOXAPARIN SODIUM 30 MG/0.3ML ~~LOC~~ SOLN
30.0000 mg | SUBCUTANEOUS | Status: DC
  Administered 2013-05-13 – 2013-05-14 (×2): 30 mg via SUBCUTANEOUS
  Filled 2013-05-13 (×3): qty 0.3

## 2013-05-13 MED ORDER — HYDROCODONE-ACETAMINOPHEN 5-325 MG PO TABS
1.0000 | ORAL_TABLET | ORAL | Status: DC | PRN
  Administered 2013-05-14: 2 via ORAL
  Filled 2013-05-13 (×2): qty 2

## 2013-05-13 MED ORDER — CLINDAMYCIN PHOSPHATE 600 MG/50ML IV SOLN
600.0000 mg | Freq: Three times a day (TID) | INTRAVENOUS | Status: DC
  Administered 2013-05-13 – 2013-05-15 (×6): 600 mg via INTRAVENOUS
  Filled 2013-05-13 (×9): qty 50

## 2013-05-13 MED ORDER — LEVOTHYROXINE SODIUM 25 MCG PO TABS
25.0000 ug | ORAL_TABLET | Freq: Every day | ORAL | Status: DC
  Administered 2013-05-14 – 2013-05-15 (×2): 25 ug via ORAL
  Filled 2013-05-13 (×4): qty 1

## 2013-05-13 NOTE — Progress Notes (Signed)
05/13/2013 patient was a direct admit to [facility], she is alert oriented and ambulatory. Patient have a wound to the right lower quadrant which was reported to me it was a abscess and  the area have redness and open and have some drainage. Dark area on bilateral arms, scarring on the left leg and left abdomen. Patient did c/o pain to the right lower quadrant and back. [facility] RN.

## 2013-05-13 NOTE — Progress Notes (Signed)
Patient ID: [name] [name], female   DOB: 09-03-1975, 37 y.o.   MRN: [id number]    Patient: [name] [name]   MRN: [id number]  DOB: February 19, 1976  PCP: Marrion Coy, MD   Subjective:    CC: Recurrent Skin Infections and Diabetes   HPI: [name] is a 37 y.o. female with a PMHx of uncontrolled T2DM, necrotizing fasciitis of left groin status post OR debridement in March 2014, uncontrolled hypothyroidism, HTN. HLD, Obesity, OSA on CPAP, Anxiety and depression, who presented to clinic today for abdominal skin infection  1) RLQ cellulitis, possible abscess and opening wound     Patient reports that she started to notice a small "skin boil" on RLQ abdominal area one week ago, which quickly progressed to a bigger area of redness and swelling. And she started to opening wound with pus drainage 3 days. She notes that the area is also hard and painful. She reports feeling warm, sweating and chills but never took her temperature. She states," I feel bad". Denies chest pain, pressure, palpitation, SOB, nausea, vomiting, diarrhea, constipation and weakness.  Denies sick contact.  Denies injury, trauma, animal or insect bites to her abdomen area.  She applied some first aid dressing on her drainage site without taking any medications.  She is here for evaluation.          Of note, patient has uncontrolled diabetes due to medical noncompliance associated with financial stress.  She was not able to get her ID, thus she was unable to apply for orange card in the past.  She states that she finally has all her paperwork ready today.   Review of Systems: Per HPI.   Current Outpatient Medications: Current Outpatient Prescriptions  Medication Sig Dispense Refill  . insulin glargine (LANTUS) 100 UNIT/ML injection Take 10 U once every morning.  10 mL  3  . busPIRone (BUSPAR) 10 MG tablet Take 1 tablet (10 mg total) by mouth 2 (two) times daily.  60 tablet  0  . FLUoxetine (PROZAC) 20 MG  tablet Take 2 tablets (40 mg total) by mouth daily.  60 tablet  0  . gabapentin (NEURONTIN) 300 MG capsule Take 300 mg by mouth 3 (three) times daily.      . hydrOXYzine (ATARAX/VISTARIL) 50 MG tablet Take 0.5 tablets (25 mg total) by mouth 3 (three) times daily as needed for itching or anxiety.  60 tablet  0  . insulin aspart (NOVOLOG) 100 UNIT/ML injection Inject 3-7 Units into the skin 3 (three) times daily with meals. *per sliding scale      . levothyroxine (SYNTHROID, LEVOTHROID) 25 MCG tablet Take 25 mcg by mouth daily before breakfast.      . lisinopril (PRINIVIL,ZESTRIL) 5 MG tablet Take 5 mg by mouth daily.      . metFORMIN (GLUCOPHAGE) 1000 MG tablet Take 1 tablet (1,000 mg total) by mouth daily with breakfast.  30 tablet  11  . pravastatin (PRAVACHOL) 40 MG tablet Take 40 mg by mouth daily.       No current facility-administered medications for this visit.    Allergies: Allergies  Allergen Reactions  . Vancomycin     Acute renal failure suspected secondary to vanco    Past Medical History  Diagnosis Date  . Diabetes mellitus     Type 2  . Hyperlipidemia   . Hypertension   . Anxiety   . Depression   . Abscess     history of multiple abscesses  .  Anemia 2002    of chronic disease  . Obesity   . Peripheral neuropathy   . Abnormal Pap smear of cervix 2009  . Endocarditis 2002    subacute bacterial endocarditis.   . OSA on CPAP     non-compliant with home CPAP  . Edema of lower extremity   . CHF (congestive heart failure)   . Depression   . Anxiety     Objective:    Physical Exam:  Filed Vitals:   05/13/13 1359  BP: 150/91  Pulse: 83  Temp: 97.4 F (36.3 C)  TempSrc: Oral  Height: 5\' 10"  (1.778 m)  Weight: 306 lb 9.6 oz (139.073 kg)  SpO2: 100%     General: Vital signs reviewed and noted. Well-developed, well-nourished, in no acute distress; alert, appropriate and cooperative throughout examination.  Head: Normocephalic, atraumatic.  Lungs:  Normal  respiratory effort. Clear to auscultation BL without crackles or wheezes.  Heart: RRR. S1 and S2 normal without gallop, rubs, murmur.  Abdomen:  BS normoactive. Soft, Nondistended. RLQ area 10 x 10 cm erythema, swelling, warmth to touch, tenderness to palpation noted. An overlying open wound size 3 x 3 cm with pus drainage noted. Positive for induration and equivocal fluctuance.    Extremities: No pretibial edema.   Assessment/ Plan:

## 2013-05-13 NOTE — Patient Instructions (Signed)
Admit to hospital 

## 2013-05-13 NOTE — Assessment & Plan Note (Signed)
Assessment: Patient presents with one-week duration of RLQ cellulitis, possible abscess in opening wound in a setting of uncontrolled type 2 diabetes secondary to medical noncompliance associated with financial stress.  Given the extent of the infectious area and possible abscess, it is reasonable to hospitalize her for further evaluation and management.  She may need abd ultrasound to assess/confirm the abscess.   Plan - Will admit to Med-Surgical floor - recs Abd ultrasound for assessment of possible abscess. - Start broad-spectrum antibiotics treatment - May need a surgical evaluation if abscess confirmed. - With her financial difficulties, she will need SW and CM assistance for medications and wound care followup upon discharge.  - Above plan discussed with Dr. Eppie Gibson.

## 2013-05-13 NOTE — Progress Notes (Signed)
Internal Medicine Attending Admission Note Date: 05/13/2013  Patient name: [name] [name] Medical record number: [id number] Date of birth: 1976-02-23 Age: 37 y.o. Gender: female  I saw and evaluated the patient and discussed her care with house staff; see the admission note by resident Dr. Alice Rieger for details of clinical findings and plans.  Chief Complaint(s): Abdominal cellulitis  History - key components related to admission: Patient is a 37 year old woman with history of diabetes mellitus, hyperlipidemia, hypertension, multiple abscesses, subacute bacterial endocarditis, and other problems as outlined in the medical history, admitted with an area of swelling, tenderness, and erythema in her right lower abdominal area, with some drainage from a central area.     Physical Exam - key components related to admission:  Filed Vitals:   05/13/13 1710  BP: 147/84  Pulse: 88  Temp: 97.4 F (36.3 C)  TempSrc: Oral  SpO2: 100%    General: Alert, no distress Lungs: Clear Heart: Regular; no extra sounds or murmurs Abdomen: Bowel sounds present, soft; there is an area of induration, erythema, and warmth in the right lower abdominal area with a central area of superficial erosion but no active drainage and no expressible pus Extremities: No edema  Lab results:   Basic Metabolic Panel:  Recent Labs  05/13/13 1638  NA 134*  K 4.0  CL 96  CO2 30  GLUCOSE 338*  BUN 13  CREATININE 0.67  CALCIUM 8.8  MG 2.0    Liver Function Tests:  Recent Labs  05/13/13 1638  AST 10  ALT 9  ALKPHOS 90  BILITOT 0.2*  PROT 7.3  ALBUMIN 2.7*     CBC:  Recent Labs  05/13/13 1638  WBC 5.5  HGB 10.7*  HCT 31.3*  MCV 80.3  PLT 266    Recent Labs  05/13/13 1638  NEUTROABS 2.2  LYMPHSABS 2.7  MONOABS 0.4  EOSABS 0.1  BASOSABS 0.0    Hemoglobin A1C  Date Value Range Status  04/16/2013 12.5   Final      CBG:  Recent Labs  05/13/13 1355 05/13/13 1618  GLUCAP  431* 334*    Imaging results:  US Abdomen Limited  05/13/2013   CLINICAL DATA:  Question abdominal wall abscess.  EXAM: LIMITED ULTRASOUND OF ABDOMINAL SOFT TISSUES  TECHNIQUE: Ultrasound examination of the abdominal wall soft tissues was performed in the area of clinical concern.  COMPARISON:  None.  FINDINGS: Ultrasound was performed in the area of concern. There is no focal fluid collection in the right lower quadrant subcutaneous soft tissues to suggest abdominal wall abscess.  IMPRESSION: No evidence of abdominal wall abscess in the area of concern.   Electronically Signed   By: Rolm Baptise M.D.   On: 05/13/2013 20:01    Assessment & Plan by Problem:  1.  Cellulitis involving right lower abdomen.  There is no evidence of abdominal wall abscess by ultrasound done today.  Plan is empiric IV clindamycin; follow exam.  2.  Diabetes mellitus.  Poorly controlled; compliance has apparently been a problem.  Plan is follow blood sugars and adjust insulin regimen as indicated; diabetes education.  3.  Other problems and plans as per the resident physician's note.

## 2013-05-13 NOTE — H&P (Signed)
Date: 05/13/2013               Patient Name:  [name] [name] MRN: [id number]  DOB: 1975/12/25 Age / Sex: 37 y.o., female   PCP: Marrion Coy, MD              Medical Service: Internal Medicine Teaching Service              Attending Physician: Dr. Axel Filler, MD    First Contact: Dr. Stann Mainland Pager: [phone]  Second Contact: Dr. Algis Liming Pager: [phone]            After Hours (After 5p/  First Contact Pager: [phone]  weekends / holidays): Second Contact Pager: [phone]   Chief Complaint: Right lower quadrant wound  History of Present Illness: [name] [name] is a 37 y.o. female with a PMHx of uncontrolled T2DM, necrotizing fasciitis of left groin status post OR debridement in March 2014, uncontrolled hypothyroidism, HTN. HLD, Obesity, OSA on CPAP, Anxiety and depression, who presented to clinic today for abdominal skin infection  Patient reports that she started to notice a small "skin boil" on RLQ abdominal area one week ago, which quickly progressed to a bigger area of redness and swelling. The swelling later evolved into an open wound, which started draining pus 3 days prior to admission. She reports feeling general malaise, with subjective fevers. She also reports increased pain around the region in the right lower abdominal quadrant over the past days. She states," I feel bad". Denies chest pain, pressure, palpitation, SOB, nausea, vomiting, diarrhea, constipation and weakness. Denies sick contact. Denies injury, trauma, animal or insect bites to her abdomen area. She applied some first aid dressing on her drainage site without taking any medications. She was evaluated today in the clinic from where she was admitted for further evaluation and treatment. Patient has limited access to healthcare due to lack of insurance and she also has been noticed to be noncompliant with her medications, including insulin.  Review of Systems: Constitutional: Denies fever,  chills, diaphoresis, HEENT: Denies hearing loss, ear pain, congestion, sore throat, rhinorrhea Respiratory: Denies SOB, DOE, cough, chest tightness, and wheezing.  Cardiovascular: Denies chest pain, palpitations and leg swelling.  Gastrointestinal: Denies diarrhea, constipation, blood in stool and abdominal distention.  Genitourinary: Denies dysuria, urgency, frequency, hematuria, flank pain and difficulty urinating.  Musculoskeletal: Denies myalgias, back pain, joint swelling, arthralgias and gait problem.  Skin: Denies pallor, rash and wound.  Neurological: Denies dizziness, seizures, syncope, weakness, lightheadedness, numbness and headaches.  Hematological: Denies adenopathy. Easy bruising, personal or family bleeding history  Psychiatric/Behavioral: Denies suicidal ideation.    Medications Prior to Admission  Medication Sig Dispense Refill  . busPIRone (BUSPAR) 10 MG tablet Take 1 tablet (10 mg total) by mouth 2 (two) times daily.  60 tablet  0  . FLUoxetine (PROZAC) 20 MG tablet Take 2 tablets (40 mg total) by mouth daily.  60 tablet  0  . gabapentin (NEURONTIN) 300 MG capsule Take 300 mg by mouth 3 (three) times daily.      . hydrOXYzine (ATARAX/VISTARIL) 50 MG tablet Take 0.5 tablets (25 mg total) by mouth 3 (three) times daily as needed for itching or anxiety.  60 tablet  0  . insulin aspart (NOVOLOG) 100 UNIT/ML injection Inject 3-7 Units into the skin 3 (three) times daily with meals. *per sliding scale      . insulin glargine (LANTUS) 100 UNIT/ML injection Take 10 U once every morning.  10 mL  3  . levothyroxine (SYNTHROID, LEVOTHROID) 25 MCG tablet Take 25 mcg by mouth daily before breakfast.      . lisinopril (PRINIVIL,ZESTRIL) 5 MG tablet Take 5 mg by mouth daily.      . metFORMIN (GLUCOPHAGE) 1000 MG tablet Take 1 tablet (1,000 mg total) by mouth daily with breakfast.  30 tablet  11  . pravastatin (PRAVACHOL) 40 MG tablet Take 40 mg by mouth daily.        Allergies: Allergies as of 05/13/2013 - Review Complete 05/13/2013  Allergen Reaction Noted  . Vancomycin  08/24/2012   Past Medical History  Diagnosis Date  . Hyperlipidemia   . Hypertension   . Abscess     history of multiple abscesses  . Obesity   . Peripheral neuropathy   . Abnormal Pap smear of cervix 2009  . Endocarditis 2002    subacute bacterial endocarditis.   . Edema of lower extremity   . Family history of anesthesia complication     "my mom has a hard time coming out from under"  . Heart murmur   . Chronic bronchitis     "get it q yr" (05/13/2013)  . OSA on CPAP   . Hypothyroidism   . Type II diabetes mellitus   . Anemia of chronic disease 2002  . History of blood transfusion     "just low blood count" (05/13/2013)  . GERD (gastroesophageal reflux disease)   . Chronic pain   . Anxiety   . Depression   . Bipolar disorder   . CHF (congestive heart failure)     pt denies this hx on 05/13/2013   Past Surgical History  Procedure Laterality Date  . Incision and drainage abscess      multiple I&Ds  . Irrigation and debridement abscess Left 07/06/2012    Procedure: IRRIGATION AND DEBRIDEMENT ABSCESS BUTTOCKS AND THIGH;  Surgeon: Shann Medal, MD;  Location: [place];  Service: General;  Laterality: Left;  . Incision and drainage of wound Left 07/07/2012    Procedure: IRRIGATION AND DEBRIDEMENT WOUND;  Surgeon: Harl Bowie, MD;  Location: [place];  Service: General;  Laterality: Left;  . Incision and drainage abscess Left 07/09/2012    Procedure: DRESSING CHANGE, THIGH WOUND;  Surgeon: Harl Bowie, MD;  Location: [facility];  Service: General;  Laterality: Left;  . Incision and drainage perirectal abscess Left 07/14/2012    Procedure: DEBRIDEMENT OF SKIN & SOFT TISSUE; DRESSING CHANGE UNDER ANESTHESIA;  Surgeon: Gayland Curry, MD,FACS;  Location: [place];  Service: General;  Laterality: Left;  . Incision and drainage perirectal abscess Left 07/16/2012     Procedure: I&D Left Thigh;  Surgeon: Gwenyth Ober, MD;  Location: [place];  Service: General;  Laterality: Left;  . Irrigation and debridement abscess Left 08/10/2012    Procedure: IRRIGATION AND DEBRIDEMENT ABSCESS;  Surgeon: Madilyn Hook, DO;  Location: [facility];  Service: General;  Laterality: Left;  . Tonsillectomy  1994   Family History  Problem Relation Age of Onset  . Heart failure Mother   . Diabetes Mother   . Kidney disease Mother   . Kidney disease Father   . Diabetes Paternal Grandmother   . Heart failure Paternal Grandmother    History   Social History  . Marital Status: Single    Spouse Name: N/A    Number of Children: N/A  . Years of Education: N/A   Occupational History  . telemarketer  Phone surveys   Social History Main Topics  . Smoking status: Current Every Day Smoker -- 0.25 packs/day for 6 years    Types: Cigarettes  . Smokeless tobacco: Never Used  . Alcohol Use: No  . Drug Use: No  . Sexual Activity: Not Currently   Other Topics Concern  . Not on file   Social History Narrative   Lives with mother currently,       Physical Exam: Filed Vitals:   05/13/13 2113  BP: 163/96  Pulse: 86  Temp: 97.3 F (36.3 C)  Resp: 18  General:  Vital signs reviewed and noted. Well-developed, well-nourished, in no acute distress; alert, appropriate and cooperative throughout examination.  Head: Normocephalic, atraumatic.  Lungs: Normal respiratory effort. Clear to auscultation BL without crackles or wheezes.  Heart: RRR. S1 and S2 normal without gallop, rubs, murmur.  Abdomen: BS normoactive. Soft, Nondistended. RLQ area 10 x 10 cm erythema, swelling, warmth to touch, tenderness to palpation noted. An overlying open wound size 3 x 3 cm with pus drainage noted. Positive for induration and equivocal fluctuance.  Extremities: No pretibial edema.  Neurologic: A&O X3, CN II - XII are grossly intact. Motor strength is 5/5 in the all 4 extremities.  Lab  results: Basic Metabolic Panel:  Recent Labs  05/13/13 1638  NA 134*  K 4.0  CL 96  CO2 30  GLUCOSE 338*  BUN 13  CREATININE 0.67  CALCIUM 8.8  MG 2.0   Liver Function Tests:  Recent Labs  05/13/13 1638  AST 10  ALT 9  ALKPHOS 90  BILITOT 0.2*  PROT 7.3  ALBUMIN 2.7*   CBC:  Recent Labs  05/13/13 1638  WBC 5.5  NEUTROABS 2.2  HGB 10.7*  HCT 31.3*  MCV 80.3  PLT 266   CBGS Recent Labs  05/13/13 1355 05/13/13 1618  GLUCAP 431* 334*   Imaging results:  US Abdomen Limited  05/13/2013   CLINICAL DATA:  Question abdominal wall abscess.  EXAM: LIMITED ULTRASOUND OF ABDOMINAL SOFT TISSUES  TECHNIQUE: Ultrasound examination of the abdominal wall soft tissues was performed in the area of clinical concern.  COMPARISON:  None.  FINDINGS: Ultrasound was performed in the area of concern. There is no focal fluid collection in the right lower quadrant subcutaneous soft tissues to suggest abdominal wall abscess.  IMPRESSION: No evidence of abdominal wall abscess in the area of concern.   Electronically Signed   By: Rolm Baptise M.D.   On: 05/13/2013 20:01    Other results: EKG: none   Assessment & Plan by Problem: Principal Problem:   Cellulitis and abscess of trunk Active Problems:   HYPERLIPIDEMIA   HYPERTENSION   GERD   Depression, major   Medically noncompliant   Unspecified hypothyroidism    [name] [name] is a 37 y.o. female with a PMHx of uncontrolled T2DM, necrotizing fasciitis of left groin status post OR debridement in March 2014, uncontrolled hypothyroidism, HTN. HLD, Obesity, OSA on CPAP, Anxiety and depression, who presented to clinic today for abdominal wall infection.  # Abdominal wall cellulitis: Rule out abscess formation. Patient with physical examination findings consistent with soft tissue infection involving the right lower quadrant abdominal wall. Patient is afebrile. No leukocytosis. However, deep-seated abscess cannot be excluded.  Currently, without evidence of systemic infection. Patient with limited health care access and inability to afford prescribed outpatient antibiotics. MRSA screening is positive which makes of skin infection due to MRSA more likely. She has hx of  necrotizing fasciitis of left groin status post OR debridement in March 2014. Uncontrolled DM put her at risk of recurrent soft tissue infection.   Plan. - Admit to MedSurg. - Blood cultures. - Limited abdomen ultrasound scan to rule out abscess formation and if abscess present, consider surgical consult for I&D. - Empiric antibiotics coverage with clindamycin IV. Patient is allergic to vancomycin. - Upon discharge, patient will need assistance with oral antibiotics due to financial limitations. - Wound care consult. - Vicodin for pain control  # Uncontrolled diabetes: A1c on 04/16/13 was 12.5%. There is concern of noncompliance with home medications. Plan. -Basal Lantus. -SSI Moderate - CBG 4 times daily a.c and at bedtime  #Hypertension: Blood pressure slightly elevated at 160/96. Will continue with home medications. Plan. -Lisinopril 5 mg daily. This may be increased to 10mg  if blood pressure remains high.  #Hypothyroidism: Patient has been noted to be noncompliant with her medications. Last TSH on 04/25/2013 was 5.34. Will continue with Synthroid 25 mcg and followup as outpatient.   Dispo: Disposition is deferred at this time, awaiting improvement of current medical problems. Anticipated discharge in approximately 2-3 day(s).   The patient does have a current PCP Marrion Coy, MD), therefore is require OPC follow-up after discharge.   The patient does have transportation limitations that hinder transportation to clinic appointments.   Signed:  Jessee Avers, MD PGY-2 Internal Medicine Teaching Service Pager: [phone] (7pm-7am) 05/13/2013, 9:36 PM

## 2013-05-14 DIAGNOSIS — E119 Type 2 diabetes mellitus without complications: Secondary | ICD-10-CM

## 2013-05-14 DIAGNOSIS — I10 Essential (primary) hypertension: Secondary | ICD-10-CM

## 2013-05-14 DIAGNOSIS — L02219 Cutaneous abscess of trunk, unspecified: Secondary | ICD-10-CM

## 2013-05-14 DIAGNOSIS — L03319 Cellulitis of trunk, unspecified: Secondary | ICD-10-CM

## 2013-05-14 LAB — GLUCOSE, CAPILLARY
Glucose-Capillary: 299 mg/dL — ABNORMAL HIGH (ref 70–99)
Glucose-Capillary: 323 mg/dL — ABNORMAL HIGH (ref 70–99)
Glucose-Capillary: 197 mg/dL — ABNORMAL HIGH (ref 70–99)
Glucose-Capillary: 363 mg/dL — ABNORMAL HIGH (ref 70–99)

## 2013-05-14 LAB — CBC WITH DIFFERENTIAL/PLATELET
Basophils Relative: 0 % (ref 0–1)
Eosinophils Absolute: 0.1 10*3/uL (ref 0.0–0.7)
Lymphs Abs: 2.1 10*3/uL (ref 0.7–4.0)
MCH: 27.6 pg (ref 26.0–34.0)
Neutro Abs: 2.8 10*3/uL (ref 1.7–7.7)
Neutrophils Relative %: 53 % (ref 43–77)
Platelets: 278 10*3/uL (ref 150–400)
RBC: 3.8 MIL/uL — ABNORMAL LOW (ref 3.87–5.11)
WBC: 5.4 10*3/uL (ref 4.0–10.5)

## 2013-05-14 LAB — BASIC METABOLIC PANEL
Calcium: 9 mg/dL (ref 8.4–10.5)
GFR calc Af Amer: >90 mL/min (ref >90)
GFR calc non Af Amer: >90 mL/min (ref >90)
Glucose, Bld: 213 mg/dL — ABNORMAL HIGH (ref 70–99)
Sodium: 136 mEq/L (ref 135–145)

## 2013-05-14 MED ORDER — MORPHINE SULFATE 2 MG/ML IJ SOLN
1.0000 mg | Freq: Once | INTRAMUSCULAR | Status: AC
  Administered 2013-05-14: 1 mg via INTRAVENOUS
  Filled 2013-05-14: qty 1

## 2013-05-14 MED ORDER — DIPHENHYDRAMINE HCL 25 MG PO CAPS
25.0000 mg | ORAL_CAPSULE | Freq: Once | ORAL | Status: AC
  Administered 2013-05-14: 25 mg via ORAL
  Filled 2013-05-14: qty 1

## 2013-05-14 MED ORDER — MORPHINE SULFATE 2 MG/ML IJ SOLN
2.0000 mg | Freq: Four times a day (QID) | INTRAMUSCULAR | Status: DC | PRN
  Administered 2013-05-14 – 2013-05-15 (×4): 2 mg via INTRAVENOUS
  Filled 2013-05-14 (×4): qty 1

## 2013-05-14 MED ORDER — WHITE PETROLATUM GEL
Status: AC
  Administered 2013-05-14: 0.2
  Filled 2013-05-14: qty 5

## 2013-05-14 MED ORDER — LIDOCAINE HCL 2 % IJ SOLN
INTRAMUSCULAR | Status: AC
  Administered 2013-05-14: 400 mg
  Filled 2013-05-14: qty 20

## 2013-05-14 MED ORDER — HYDROCODONE-ACETAMINOPHEN 5-325 MG PO TABS
1.0000 | ORAL_TABLET | Freq: Four times a day (QID) | ORAL | Status: DC | PRN
  Administered 2013-05-14 – 2013-05-15 (×3): 2 via ORAL
  Filled 2013-05-14 (×3): qty 2

## 2013-05-14 MED ORDER — INSULIN ASPART 100 UNIT/ML ~~LOC~~ SOLN
0.0000 [IU] | Freq: Three times a day (TID) | SUBCUTANEOUS | Status: DC
  Administered 2013-05-14: 11 [IU] via SUBCUTANEOUS
  Administered 2013-05-14: 3 [IU] via SUBCUTANEOUS
  Administered 2013-05-14 – 2013-05-15 (×2): 15 [IU] via SUBCUTANEOUS

## 2013-05-14 NOTE — Care Management Note (Signed)
   CARE MANAGEMENT NOTE 05/14/2013  Patient:  [name] [name], [name] [name]   Account Number:  [id number]  Date Initiated:  05/14/2013  Documentation initiated by:  Keiosha Cancro  Subjective/Objective Assessment:   Noted referral for assistance with medications.     Action/Plan:   CM will follow for d/c needs, noted that pt has [facility] card. Will attempt to assist however even if we are able to do [facility] program the pt will have a copay of $3 for each medication. MATCH does not cover IV medications if needed.   Anticipated DC Date:     Anticipated DC Plan:  [place]         Choice offered to / List presented to:             Status of service:   Medicare Important Message given?   (If response is "NO", the following Medicare IM given date fields will be blank) Date Medicare IM given:   Date Additional Medicare IM given:    Discharge Disposition:    Per UR Regulation:    If discussed at Long Length of Stay Meetings, dates discussed:    Comments:  05/14/2013 Following for d/c needs. Will try to do [place], however may not be able to do this as pt has [facility] card. Please be aware that pt will have a copay with the [facility] program as well and MATCH will not cover IV antibiotics or pain meds. Jasmine Pang RN MPH, case manager, [phone]

## 2013-05-14 NOTE — Progress Notes (Signed)
  Date: 05/14/2013  Patient name: [name] [name]  Medical record number: [id number]  Date of birth: January 18, 1976   This patient has been seen and the plan of care was discussed with the house staff. Please see their note for complete details. I concur with their findings with the following additions/corrections:  Agree with surgery consult, continue IV Abx and pain control as noted in Dr. Stann Mainland' note.   Sid Falcon, MD 05/14/2013, 2:24 PM

## 2013-05-14 NOTE — Procedures (Addendum)
Incision and Drainage Procedure Note  Pre-operative Diagnosis: RLQ abdominal wall abscess  Post-operative Diagnosis: same  Indications: RLQ cellulitis with central necrotic lesion and fluctuence  Anesthesia: 2% plain lidocaine  Procedure Details  The procedure, risks and complications have been discussed in detail (including, but not limited to airway compromise, infection, bleeding) with the patient, and the patient has signed consent to the procedure.  The skin was sterilely prepped and draped over the affected area in the usual fashion. After adequate local anesthesia, I&D with a #11 blade was performed on the right lower quadrant. Purulent drainage: present The patient was observed until stable.  Findings: Consent was obtained and time out performed. After local anesthetic was infiltrated the area was prepped and draped sterilely. A transverse incision was made across the necrotic area. Approxiamtely 2-79ml of purulent fluid was encountered and collected for culture. The necrotic tissue was quite superficial and was sharply debrided with scissors and scalpel until viable bleeding tissue was encountered. A small cavity tracked deep and medially but no purulence was observed and the patient's discomfort limited exploration. The wound was loosely packed with a moist 2" Kerlix and covered with dry 4x4's. She tolerated the procedure well.  EBL: Minimal  Drains: None  Condition: Tolerated procedure well   Complications: none.    Lisette Abu, PA-C Pager: [phone]   Though it was included in the body of the note, a procedure for sharp wound debridement of skin and subcutaneous tissues was performed as well.   Lisette Abu, PA-C Pager: [phone]

## 2013-05-14 NOTE — Progress Notes (Signed)
UR completed. Patient changed to inpatient r/t requiring IV antibiotics.  

## 2013-05-14 NOTE — Care Management Note (Signed)
   CARE MANAGEMENT NOTE 05/14/2013  Patient:  [name] [name], [name] [name]   Account Number:  [id number]  Date Initiated:  05/14/2013  Documentation initiated by:  Janaki Exley  Subjective/Objective Assessment:   Noted referral for assistance with medications.     Action/Plan:   CM will follow for d/c needs, noted that pt has [facility] card. Will attempt to assist however even if we are able to do [facility] program the pt will have a copay of $3 for each medication. MATCH does not cover IV medications if needed.   Anticipated DC Date:     Anticipated DC Plan:  [place] Program      Choice offered to / List presented to:             Status of service:   Medicare Important Message given?   (If response is "NO", the following Medicare IM given date fields will be blank) Date Medicare IM given:   Date Additional Medicare IM given:    Discharge Disposition:    Per UR Regulation:    If discussed at Long Length of Stay Meetings, dates discussed:    Comments:  05/14/2013 Met with pt and explained [place] letter and pt need to pay $3 per prescription for a 30 day supply of meds excluding narcotics. Spoke with pt RN, Caryl Pina T and placed [place] letter in pt shadow chart to be given to pt at time of d/c with prescriptions.   Jasmine Pang RN MPH, case manager, [phone]  05/14/2013 Following for d/c needs. Will try to do [place], however may not be able to do this as pt has [facility] card. Please be aware that pt will have a copay with the [facility] program as well and MATCH will not cover IV antibiotics or pain meds. Jasmine Pang RN MPH, case manager, [phone]

## 2013-05-14 NOTE — Progress Notes (Signed)
Patient says that she knows how to use the CPAP machine on her own and she can put it on when she's ready for it. Patient says she uses a CPAP at home and knows how to turn it on and off as she likes. Patient is aware to call RT if she does need any help with the machine. RT will continue to assist as needed.

## 2013-05-14 NOTE — Progress Notes (Signed)
I saw and evaluated the patient.  I personally confirmed the key portions of Dr. August Saucer history and exam and reviewed pertinent patient test results.  The assessment, diagnosis, and plan were formulated together and I agree with the documentation in the resident's note.  I am concerned [name] [name] does not have the resources or support to manage this cellulitis as an outpatient.  She will be admitted for initial therapy and the assistance of Social Work services to figure out resources and a plan to effectively manage this infection.

## 2013-05-14 NOTE — Consult Note (Signed)
Reason for Consult:RLQ abscess Referring Physician: [name] is an 37 y.o. female.  HPI: [name] [name] is well-known to the general surgery service after contracting necrotizing fasciitis in February of this year. She came in with an approximately 1 week history of RLQ pain and redness. Denies constitutional s/sx. She developed a lesion in the middle of the red area about 3 days ago and it's been draining some "pus-like fluid with some blood". She did not attempt any self-treatment before seeking care yesterday.    Past Medical History  Diagnosis Date  . Hyperlipidemia   . Hypertension   . Abscess     history of multiple abscesses  . Obesity   . Peripheral neuropathy   . Abnormal Pap smear of cervix 2009  . Endocarditis 2002    subacute bacterial endocarditis.   . Edema of lower extremity   . Family history of anesthesia complication     "my mom has a hard time coming out from under"  . Heart murmur   . Chronic bronchitis     "get it q yr" (05/13/2013)  . OSA on CPAP   . Hypothyroidism   . Type II diabetes mellitus   . Anemia of chronic disease 2002  . History of blood transfusion     "just low blood count" (05/13/2013)  . GERD (gastroesophageal reflux disease)   . Chronic pain   . Anxiety   . Depression   . Bipolar disorder   . CHF (congestive heart failure)     pt denies this hx on 05/13/2013    Past Surgical History  Procedure Laterality Date  . Incision and drainage abscess      multiple I&Ds  . Irrigation and debridement abscess Left 07/06/2012    Procedure: IRRIGATION AND DEBRIDEMENT ABSCESS BUTTOCKS AND THIGH;  Surgeon: Shann Medal, MD;  Location: [place];  Service: General;  Laterality: Left;  . Incision and drainage of wound Left 07/07/2012    Procedure: IRRIGATION AND DEBRIDEMENT WOUND;  Surgeon: Harl Bowie, MD;  Location: [place];  Service: General;  Laterality: Left;  . Incision and drainage abscess Left 07/09/2012    Procedure:  DRESSING CHANGE, THIGH WOUND;  Surgeon: Harl Bowie, MD;  Location: [place];  Service: General;  Laterality: Left;  . Incision and drainage perirectal abscess Left 07/14/2012    Procedure: DEBRIDEMENT OF SKIN & SOFT TISSUE; DRESSING CHANGE UNDER ANESTHESIA;  Surgeon: Gayland Curry, MD,FACS;  Location: [place];  Service: General;  Laterality: Left;  . Incision and drainage perirectal abscess Left 07/16/2012    Procedure: I&D Left Thigh;  Surgeon: Gwenyth Ober, MD;  Location: [place];  Service: General;  Laterality: Left;  . Irrigation and debridement abscess Left 08/10/2012    Procedure: IRRIGATION AND DEBRIDEMENT ABSCESS;  Surgeon: Madilyn Hook, DO;  Location: [place];  Service: General;  Laterality: Left;  . Tonsillectomy  1994    Family History  Problem Relation Age of Onset  . Heart failure Mother   . Diabetes Mother   . Kidney disease Mother   . Kidney disease Father   . Diabetes Paternal Grandmother   . Heart failure Paternal Grandmother     Social History:  reports that she has been smoking Cigarettes.  She has a 1.5 pack-year smoking history. She has never used smokeless tobacco. She reports that she does not drink alcohol or use illicit drugs.  Allergies:  Allergies  Allergen Reactions  . Vancomycin  Acute renal failure suspected secondary to vanco    Medications: I have reviewed the patient's current medications.  Results for orders placed during the hospital encounter of 05/13/13 (from the past 48 hour(s))  GLUCOSE, CAPILLARY     Status: Abnormal   Collection Time    05/13/13  4:18 PM      Result Value Range   Glucose-Capillary 334 (*) 70 - 99 mg/dL  COMPREHENSIVE METABOLIC PANEL     Status: Abnormal   Collection Time    05/13/13  4:38 PM      Result Value Range   Sodium 134 (*) 135 - 145 mEq/L   Potassium 4.0  3.5 - 5.1 mEq/L   Chloride 96  96 - 112 mEq/L   CO2 30  19 - 32 mEq/L   Glucose, Bld 338 (*) 70 - 99 mg/dL   BUN 13  6 - 23 mg/dL   Creatinine, Ser  0.67  0.50 - 1.10 mg/dL   Calcium 8.8  8.4 - 10.5 mg/dL   Total Protein 7.3  6.0 - 8.3 g/dL   Albumin 2.7 (*) 3.5 - 5.2 g/dL   AST 10  0 - 37 U/L   ALT 9  0 - 35 U/L   Alkaline Phosphatase 90  39 - 117 U/L   Total Bilirubin 0.2 (*) 0.3 - 1.2 mg/dL   GFR calc non Af Amer >90  >90 mL/min   GFR calc Af Amer >90  >90 mL/min   Comment: (NOTE)     The eGFR has been calculated using the CKD EPI equation.     This calculation has not been validated in all clinical situations.     eGFR's persistently <90 mL/min signify possible Chronic Kidney     Disease.  MAGNESIUM     Status: None   Collection Time    05/13/13  4:38 PM      Result Value Range   Magnesium 2.0  1.5 - 2.5 mg/dL  CBC WITH DIFFERENTIAL     Status: Abnormal   Collection Time    05/13/13  4:38 PM      Result Value Range   WBC 5.5  4.0 - 10.5 K/uL   RBC 3.90  3.87 - 5.11 MIL/uL   Hemoglobin 10.7 (*) 12.0 - 15.0 g/dL   HCT 31.3 (*) 36.0 - 46.0 %   MCV 80.3  78.0 - 100.0 fL   MCH 27.4  26.0 - 34.0 pg   MCHC 34.2  30.0 - 36.0 g/dL   RDW 12.2  11.5 - 15.5 %   Platelets 266  150 - 400 K/uL   Neutrophils Relative % 40 (*) 43 - 77 %   Neutro Abs 2.2  1.7 - 7.7 K/uL   Lymphocytes Relative 49 (*) 12 - 46 %   Lymphs Abs 2.7  0.7 - 4.0 K/uL   Monocytes Relative 8  3 - 12 %   Monocytes Absolute 0.4  0.1 - 1.0 K/uL   Eosinophils Relative 3  0 - 5 %   Eosinophils Absolute 0.1  0.0 - 0.7 K/uL   Basophils Relative 0  0 - 1 %   Basophils Absolute 0.0  0.0 - 0.1 K/uL  CULTURE, BLOOD (ROUTINE X 2)     Status: None   Collection Time    05/13/13  6:40 PM      Result Value Range   Specimen Description BLOOD LEFT ARM     Special Requests BOTTLES DRAWN AEROBIC AND ANAEROBIC 10CC  Culture  Setup Time       Value: 05/13/2013 23:38     Performed at [facility]   Culture       Value:        BLOOD CULTURE RECEIVED NO GROWTH TO DATE CULTURE WILL BE HELD FOR 5 DAYS BEFORE ISSUING A FINAL NEGATIVE REPORT     Performed at [facility]   Report Status PENDING    CULTURE, BLOOD (ROUTINE X 2)     Status: None   Collection Time    05/13/13  6:52 PM      Result Value Range   Specimen Description BLOOD RIGHT ARM     Special Requests BOTTLES DRAWN AEROBIC AND ANAEROBIC 10CC     Culture  Setup Time       Value: 05/13/2013 23:39     Performed at [facility]   Culture       Value:        BLOOD CULTURE RECEIVED NO GROWTH TO DATE CULTURE WILL BE HELD FOR 5 DAYS BEFORE ISSUING A FINAL NEGATIVE REPORT     Performed at [facility]   Report Status PENDING    MRSA PCR SCREENING     Status: None   Collection Time    05/13/13  7:12 PM      Result Value Range   MRSA by PCR NEGATIVE  NEGATIVE   Comment:            The GeneXpert MRSA Assay (FDA     approved for NASAL specimens     only), is one component of a     comprehensive MRSA colonization     surveillance program. It is not     intended to diagnose MRSA     infection nor to guide or     monitor treatment for     MRSA infections.  GLUCOSE, CAPILLARY     Status: Abnormal   Collection Time    05/13/13  9:05 PM      Result Value Range   Glucose-Capillary 299 (*) 70 - 99 mg/dL  GLUCOSE, CAPILLARY     Status: Abnormal   Collection Time    05/14/13  7:39 AM      Result Value Range   Glucose-Capillary 323 (*) 70 - 99 mg/dL   Comment 1 Documented in Chart    BASIC METABOLIC PANEL     Status: Abnormal   Collection Time    05/14/13 11:15 AM      Result Value Range   Sodium 136  135 - 145 mEq/L   Potassium 3.9  3.5 - 5.1 mEq/L   Chloride 98  96 - 112 mEq/L   CO2 29  19 - 32 mEq/L   Glucose, Bld 213 (*) 70 - 99 mg/dL   BUN 15  6 - 23 mg/dL   Creatinine, Ser 0.70  0.50 - 1.10 mg/dL   Calcium 9.0  8.4 - 10.5 mg/dL   GFR calc non Af Amer >90  >90 mL/min   GFR calc Af Amer >90  >90 mL/min   Comment: (NOTE)     The eGFR has been calculated using the CKD EPI equation.     This calculation has not been validated in all clinical situations.      eGFR's persistently <90 mL/min signify possible Chronic Kidney     Disease.  CBC WITH DIFFERENTIAL     Status: Abnormal   Collection Time    05/14/13 11:15  AM      Result Value Range   WBC 5.4  4.0 - 10.5 K/uL   RBC 3.80 (*) 3.87 - 5.11 MIL/uL   Hemoglobin 10.5 (*) 12.0 - 15.0 g/dL   HCT 30.5 (*) 36.0 - 46.0 %   MCV 80.3  78.0 - 100.0 fL   MCH 27.6  26.0 - 34.0 pg   MCHC 34.4  30.0 - 36.0 g/dL   RDW 12.2  11.5 - 15.5 %   Platelets 278  150 - 400 K/uL   Neutrophils Relative % 53  43 - 77 %   Neutro Abs 2.8  1.7 - 7.7 K/uL   Lymphocytes Relative 39  12 - 46 %   Lymphs Abs 2.1  0.7 - 4.0 K/uL   Monocytes Relative 6  3 - 12 %   Monocytes Absolute 0.3  0.1 - 1.0 K/uL   Eosinophils Relative 3  0 - 5 %   Eosinophils Absolute 0.1  0.0 - 0.7 K/uL   Basophils Relative 0  0 - 1 %   Basophils Absolute 0.0  0.0 - 0.1 K/uL  GLUCOSE, CAPILLARY     Status: Abnormal   Collection Time    05/14/13 11:20 AM      Result Value Range   Glucose-Capillary 197 (*) 70 - 99 mg/dL   Comment 1 Documented in Chart      US Abdomen Limited  05/13/2013   CLINICAL DATA:  Question abdominal wall abscess.  EXAM: LIMITED ULTRASOUND OF ABDOMINAL SOFT TISSUES  TECHNIQUE: Ultrasound examination of the abdominal wall soft tissues was performed in the area of clinical concern.  COMPARISON:  None.  FINDINGS: Ultrasound was performed in the area of concern. There is no focal fluid collection in the right lower quadrant subcutaneous soft tissues to suggest abdominal wall abscess.  IMPRESSION: No evidence of abdominal wall abscess in the area of concern.   Electronically Signed   By: Rolm Baptise M.D.   On: 05/13/2013 20:01    Review of Systems  Constitutional: Negative for fever and chills.  Gastrointestinal: Positive for abdominal pain.  All other systems reviewed and are negative.   Blood pressure 132/82, pulse 78, temperature 97.8 F (36.6 C), temperature source Oral, resp. rate 18, height 5\' 10"  (1.778 m), weight  300 lb 6.4 oz (136.261 kg), SpO2 99.00%. Physical Exam  Constitutional: She is oriented to person, place, and time. She appears well-developed and well-nourished. She appears lethargic. No distress.  HENT:  Head: Normocephalic and atraumatic.  Eyes: Conjunctivae are normal. Pupils are equal, round, and reactive to light. No scleral icterus.  Cardiovascular: Normal rate, regular rhythm and normal heart sounds.  Exam reveals no gallop and no friction rub.   No murmur heard. Respiratory: Effort normal and breath sounds normal. No respiratory distress. She has no wheezes. She has no rales.  GI: Soft. Bowel sounds are normal. There is tenderness in the right lower quadrant.    Genitourinary: Vagina normal.  Musculoskeletal: Normal range of motion.  Lymphadenopathy:    She has no cervical adenopathy.       Right: No inguinal adenopathy present.       Left: No inguinal adenopathy present.  Neurological: She is oriented to person, place, and time. She appears lethargic.  Skin: Skin is warm and dry.  Psychiatric: She has a normal mood and affect.       Assessment/Plan: RLQ cellulitis with superficial necrosis -- I&D of the necrotic area performed. See separate note. She did  not have a significant abscess. I recommend q8h wound packing with 1/4" iodoform gauze for the first 24h and then change to 1/4" plain gauze thereafter. Wound culture was sent. She does not need to stay in the hospital from our standpoint and can follow up with primary care or with general surgery within 2 weeks from discharge. We will follow along with you while she is here.  Thank you for the consult.    Lisette Abu, PA-C Pager: [phone] 05/14/2013, 2:15 PM

## 2013-05-14 NOTE — Progress Notes (Signed)
Pt States she is leaving and request removal of her IV. Pt states " I can do this for myself at home". C/o pain this am to lower abdomen last dose of medication received @ 0321 educated that it was too soon as dosing and frequency had already been adjusted. Pt States we are not "treating Her". Educated on the importance her abt regimen. Pt reports she is still leaving. Advised that this was against Medical advice. Page to MD with call back. To come and speak with patient. AMA papers signed and to chart. Dorthey Sawyer

## 2013-05-14 NOTE — Progress Notes (Signed)
Subjective: [name] [name] threatened to leave AMA this morning and even had RN remove IV line.  She was resting comfortably in bed (on right side) when we evaluated her.  She is willing to stay at least one more day for IV antibiotics and pain control.   Objective: Vital signs in last 24 hours: Filed Vitals:   05/13/13 2113 05/14/13 0250 05/14/13 0512 05/14/13 1000  BP: 163/96  124/81 106/66  Pulse: 86 79 84 80  Temp: 97.3 F (36.3 C)  97.6 F (36.4 C) 97.4 F (36.3 C)  TempSrc: Oral  Oral Oral  Resp: 18 16 24 20   Height: 5\' 10"  (1.778 m)     Weight: 300 lb 6.4 oz (136.261 kg)     SpO2: 100% 100% 99% 98%   Weight change:   Intake/Output Summary (Last 24 hours) at 05/14/13 1212 Last data filed at 05/13/13 1858  Gross per 24 hour  Intake    290 ml  Output      0 ml  Net    290 ml   PEX General: alert, cooperative, and in no apparent distress HEENT: NCAT Neck: supple, no lymphadenopathy Lungs: clear to ascultation bilaterally, normal work of respiration, no wheezes, rales, ronchi Heart: regular rate and rhythm, no murmurs, gallops, or rubs Abdomen: RLQ with warmth to touch, TTP, and induration ~7 cm to each side of lesion, no erythema and bandage in place thus discharge not appreciated this morning; soft, non-tender, non-distended, normal bowel sounds Extremities: 2+ DP/PT pulses bilaterally, no cyanosis, clubbing, or edema Neurologic: alert & oriented X3, cranial nerves II-XII intact, strength grossly intact, sensation intact to light touch  Lab Results: Basic Metabolic Panel:  Recent Labs Lab 05/13/13 1638  NA 134*  K 4.0  CL 96  CO2 30  GLUCOSE 338*  BUN 13  CREATININE 0.67  CALCIUM 8.8  MG 2.0   Liver Function Tests:  Recent Labs Lab 05/13/13 1638  AST 10  ALT 9  ALKPHOS 90  BILITOT 0.2*  PROT 7.3  ALBUMIN 2.7*   CBC:  Recent Labs Lab 05/13/13 1638 05/14/13 1115  WBC 5.5 5.4  NEUTROABS 2.2 2.8  HGB 10.7* 10.5*  HCT 31.3* 30.5*  MCV 80.3  80.3  PLT 266 278   CBG:  Recent Labs Lab 05/13/13 1355 05/13/13 1618 05/13/13 2105 05/14/13 0739 05/14/13 1120  GLUCAP 431* 334* 299* 323* 197*    Micro Results: Recent Results (from the past 240 hour(s))  CULTURE, BLOOD (ROUTINE X 2)     Status: None   Collection Time    05/13/13  6:40 PM      Result Value Range Status   Specimen Description BLOOD LEFT ARM   Final   Special Requests BOTTLES DRAWN AEROBIC AND ANAEROBIC 10CC   Final   Culture  Setup Time     Final   Value: 05/13/2013 23:38     Performed at [facility]   Culture     Final   Value:        BLOOD CULTURE RECEIVED NO GROWTH TO DATE CULTURE WILL BE HELD FOR 5 DAYS BEFORE ISSUING A FINAL NEGATIVE REPORT     Performed at [facility]   Report Status PENDING   Incomplete  CULTURE, BLOOD (ROUTINE X 2)     Status: None   Collection Time    05/13/13  6:52 PM      Result Value Range Status   Specimen Description BLOOD RIGHT ARM   Final  Special Requests BOTTLES DRAWN AEROBIC AND ANAEROBIC 10CC   Final   Culture  Setup Time     Final   Value: 05/13/2013 23:39     Performed at [facility]   Culture     Final   Value:        BLOOD CULTURE RECEIVED NO GROWTH TO DATE CULTURE WILL BE HELD FOR 5 DAYS BEFORE ISSUING A FINAL NEGATIVE REPORT     Performed at [facility]   Report Status PENDING   Incomplete  MRSA PCR SCREENING     Status: None   Collection Time    05/13/13  7:12 PM      Result Value Range Status   MRSA by PCR NEGATIVE  NEGATIVE Final   Comment:            The GeneXpert MRSA Assay (FDA     approved for NASAL specimens     only), is one component of a     comprehensive MRSA colonization     surveillance program. It is not     intended to diagnose MRSA     infection nor to guide or     monitor treatment for     MRSA infections.   Studies/Results: US Abdomen Limited  05/13/2013   CLINICAL DATA:  Question abdominal wall abscess.  EXAM: LIMITED ULTRASOUND OF  ABDOMINAL SOFT TISSUES  TECHNIQUE: Ultrasound examination of the abdominal wall soft tissues was performed in the area of clinical concern.  COMPARISON:  None.  FINDINGS: Ultrasound was performed in the area of concern. There is no focal fluid collection in the right lower quadrant subcutaneous soft tissues to suggest abdominal wall abscess.  IMPRESSION: No evidence of abdominal wall abscess in the area of concern.   Electronically Signed   By: Rolm Baptise M.D.   On: 05/13/2013 20:01   Medications: I have reviewed the patient's current medications. Scheduled Meds: . clindamycin (CLEOCIN) IV  600 mg Intravenous Q8H  . enoxaparin (LOVENOX) injection  30 mg Subcutaneous Q24H  . insulin aspart  0-15 Units Subcutaneous TID WC  . insulin glargine  15 Units Subcutaneous QHS  . levothyroxine  25 mcg Oral QAC breakfast  . lisinopril  5 mg Oral Daily  . simvastatin  20 mg Oral q1800   PRN Meds:.HYDROcodone-acetaminophen, morphine injection Assessment/Plan: #Cellulitis of right lower abdomen- Exam findings consistent with soft tissue infection of the right lower quadrant.  Of note, she has history of necrotizing fasciitis of left groin s/p debridement in 07/2012.  Patient is afebrile, no leucocytosis, does not meet criteria for sepsis. Abdominal ultrasound with no focal fluid collection in RLQ subcutaneous soft tissues to suggest abdominal wall abscess.  -blood cultures pending -continue clindamycin IV 600 mg q8h (Patient has allergy to vancomycin.)  -wound care consult- per discussion on phone, 3x3 cm area covered in slough with fluctuance, induration 8 cm and 6 cm on either side of lesion; recommend surgery consult especially given patient's recurrent abcesses and risk factors including diabetes -general surgery consult for possible I&D per wound care recs -Vicodin 5-325 mg 1-2 tabs q6h, morphine 2 mg IV q6h prn for pain control today (staggered q3h); will convert to oral pain meds tomorrow -case  management consult- spoke to [place] this morning who will set up Match program for patient; we will write prescriptions for oral clindamycin as well as several other medications that she has not been taking due to financial constraints; all medications will be $3 for one  month supplies of each   # Uncontrolled DM2- A1C 12.5% on 04/16/13, medication noncompliance due to financial constraints. -Lantus 15 units daily -SSI Moderate  -CBGs AC and qhs  # Hypertension: normotensive now -continue Lisinopril 5 mg daily  # Hypothyroidism- Last TSH 5.34 on 04/25/13. Again, patient noncompliant with home meds. -continue Synthroid 25 mcg, followup as outpatient  Dispo: Disposition is deferred at this time, awaiting improvement of current medical problems.  Anticipated discharge in approximately 1-2 day(s).   The patient does have a current PCP Marrion Coy, MD) and does need an [facility] hospital follow-up appointment after discharge.   .Services Needed at time of discharge: Y = Yes, Blank = No PT:   OT:   RN:   Equipment:   Other:     LOS: 1 day   Ivin Poot, MD 05/14/2013, 12:12 PM

## 2013-05-14 NOTE — Consult Note (Signed)
WOC wound consult note Reason for Consult: evaluation of pannus/cellulitis and ulceration.  Pt with hx of necrotizing fascitis and other ulcerations similar to this. Wound type: ulceration, full thickness right lateral pannus Measurement: 3.0cm x 3.5cm x 0 with induration that extends 6cm to the right of the ulceration and 8cm to the left of ulceration Wound bed:100% yellow slough, with fluctuance under the slough, very tender at the time of my exam Drainage (amount, consistency, odor) minimal currently Periwound:erthryma and induration   Dressing procedure/placement/frequency: dry dressing for now, consult surgery to determine if local I&D with packing would suffice or if wider OR debridement needed.  [facility] with recommendations for surgical evaluation.  Discussed POC with patient and bedside nurse.  Re consult if needed, will not follow at this time. Thanks  Emalyn Schou [place], [place] [phone])

## 2013-05-14 NOTE — H&P (Signed)
Internal Medicine Attending Admission Note Date: 05/13/2013  Patient name: [name] [name] Medical record number: [id number] Date of birth: 12-Sep-1975 Age: 37 y.o. Gender: female  I saw and evaluated the patient and discussed her care with house staff; see the admission note by resident Dr. Alice Rieger for details of clinical findings and plans.  Chief Complaint(s): Abdominal cellulitis  History - key components related to admission: Patient is a 36 year old woman with history of diabetes mellitus, hyperlipidemia, hypertension, multiple abscesses, subacute bacterial endocarditis, and other problems as outlined in the medical history, admitted with an area of swelling, tenderness, and erythema in her right lower abdominal area, with some drainage from a central area.     Physical Exam - key components related to admission:  Filed Vitals:   05/13/13 1710  BP: 147/84  Pulse: 88  Temp: 97.4 F (36.3 C)  TempSrc: Oral  SpO2: 100%    General: Alert, no distress Lungs: Clear Heart: Regular; no extra sounds or murmurs Abdomen: Bowel sounds present, soft; there is an area of induration, erythema, and warmth in the right lower abdominal area with a central area of superficial erosion but no active drainage and no expressible pus Extremities: No edema  Lab results:   Basic Metabolic Panel:  Recent Labs  05/13/13 1638  NA 134*  K 4.0  CL 96  CO2 30  GLUCOSE 338*  BUN 13  CREATININE 0.67  CALCIUM 8.8  MG 2.0    Liver Function Tests:  Recent Labs  05/13/13 1638  AST 10  ALT 9  ALKPHOS 90  BILITOT 0.2*  PROT 7.3  ALBUMIN 2.7*     CBC:  Recent Labs  05/13/13 1638  WBC 5.5  HGB 10.7*  HCT 31.3*  MCV 80.3  PLT 266    Recent Labs  05/13/13 1638  NEUTROABS 2.2  LYMPHSABS 2.7  MONOABS 0.4  EOSABS 0.1  BASOSABS 0.0    Hemoglobin A1C  Date Value Range Status  04/16/2013 12.5   Final      CBG:  Recent Labs  05/13/13 1355 05/13/13 1618  GLUCAP  431* 334*    Imaging results:  US Abdomen Limited  05/13/2013   CLINICAL DATA:  Question abdominal wall abscess.  EXAM: LIMITED ULTRASOUND OF ABDOMINAL SOFT TISSUES  TECHNIQUE: Ultrasound examination of the abdominal wall soft tissues was performed in the area of clinical concern.  COMPARISON:  None.  FINDINGS: Ultrasound was performed in the area of concern. There is no focal fluid collection in the right lower quadrant subcutaneous soft tissues to suggest abdominal wall abscess.  IMPRESSION: No evidence of abdominal wall abscess in the area of concern.   Electronically Signed   By: Rolm Baptise M.D.   On: 05/13/2013 20:01    Assessment & Plan by Problem:  1.  Cellulitis involving right lower abdomen.  There is no evidence of abdominal wall abscess by ultrasound done today.  Plan is empiric IV clindamycin; follow exam.  2.  Diabetes mellitus.  Poorly controlled; compliance has apparently been a problem.  Plan is follow blood sugars and adjust insulin regimen as indicated; diabetes education.  3.  Other problems and plans as per the resident physician's note.

## 2013-05-15 LAB — BASIC METABOLIC PANEL
CO2: 24 mEq/L (ref 19–32)
Chloride: 96 mEq/L (ref 96–112)
Glucose, Bld: 341 mg/dL — ABNORMAL HIGH (ref 70–99)
Potassium: 4.9 mEq/L (ref 3.5–5.1)
Sodium: 131 mEq/L — ABNORMAL LOW (ref 135–145)

## 2013-05-15 LAB — GLUCOSE, CAPILLARY: Glucose-Capillary: 330 mg/dL — ABNORMAL HIGH (ref 70–99)

## 2013-05-15 MED ORDER — HYDROCODONE-ACETAMINOPHEN 5-325 MG PO TABS: 1.0000 | ORAL_TABLET | Freq: Four times a day (QID) | ORAL | Status: DC | PRN

## 2013-05-15 MED ORDER — LEVOTHYROXINE SODIUM 25 MCG PO TABS: 25.0000 ug | ORAL_TABLET | Freq: Every day | ORAL | Status: DC

## 2013-05-15 MED ORDER — INSULIN GLARGINE 100 UNIT/ML ~~LOC~~ SOLN: 15.0000 [IU] | Freq: Every day | SUBCUTANEOUS | Status: DC

## 2013-05-15 MED ORDER — CLINDAMYCIN HCL 300 MG PO CAPS: 300.0000 mg | ORAL_CAPSULE | Freq: Three times a day (TID) | ORAL | Status: DC

## 2013-05-15 MED ORDER — LISINOPRIL 5 MG PO TABS: 5.0000 mg | ORAL_TABLET | Freq: Every day | ORAL | Status: DC

## 2013-05-15 MED ORDER — ENOXAPARIN SODIUM 60 MG/0.6ML ~~LOC~~ SOLN
60.0000 mg | SUBCUTANEOUS | Status: DC
  Filled 2013-05-15: qty 0.6

## 2013-05-15 MED ORDER — METFORMIN HCL 500 MG PO TABS: 500.0000 mg | ORAL_TABLET | Freq: Two times a day (BID) | ORAL | Status: DC

## 2013-05-15 MED ORDER — SIMVASTATIN 20 MG PO TABS: 20.0000 mg | ORAL_TABLET | Freq: Every day | ORAL | Status: DC

## 2013-05-15 NOTE — Discharge Summary (Signed)
Name: [name] [name] MRN: [id number] DOB: February 27, 1976 37 y.o. PCP: Marrion Coy, MD  Date of Admission: 05/13/2013  4:00 PM Date of Discharge: 05/15/2013 Attending Physician: Dr. Marinda Elk  Discharge Diagnosis: Principal Problem:   Cellulitis and abscess of trunk Active Problems:   HYPERLIPIDEMIA   HYPERTENSION   GERD   Depression, major   Medically noncompliant   Unspecified hypothyroidism  Discharge Medications:   Medication List    STOP taking these medications       pravastatin 40 MG tablet  Commonly known as:  PRAVACHOL  Replaced by:  simvastatin 20 MG tablet      TAKE these medications       busPIRone 10 MG tablet  Commonly known as:  BUSPAR  Take 1 tablet (10 mg total) by mouth 2 (two) times daily.     clindamycin 300 MG capsule  Commonly known as:  CLEOCIN  Take 1 capsule (300 mg total) by mouth 3 (three) times daily.     FLUoxetine 20 MG tablet  Commonly known as:  PROZAC  Take 2 tablets (40 mg total) by mouth daily.     gabapentin 300 MG capsule  Commonly known as:  NEURONTIN  Take 300 mg by mouth 3 (three) times daily.     HYDROcodone-acetaminophen 5-325 MG per tablet  Commonly known as:  NORCO/VICODIN  Take 1 tablet by mouth every 6 (six) hours as needed for moderate pain.     hydrOXYzine 50 MG tablet  Commonly known as:  ATARAX/VISTARIL  Take 0.5 tablets (25 mg total) by mouth 3 (three) times daily as needed for itching or anxiety.     insulin aspart 100 UNIT/ML injection  Commonly known as:  novoLOG  Inject 3-7 Units into the skin 3 (three) times daily with meals. *per sliding scale     insulin glargine 100 UNIT/ML injection  Commonly known as:  LANTUS  Inject 0.15 mLs (15 Units total) into the skin at bedtime.     levothyroxine 25 MCG tablet  Commonly known as:  SYNTHROID, LEVOTHROID  Take 1 tablet (25 mcg total) by mouth daily before breakfast.     lisinopril 5 MG tablet  Commonly known as:  PRINIVIL,ZESTRIL  Take 1  tablet (5 mg total) by mouth daily.     metFORMIN 500 MG tablet  Commonly known as:  GLUCOPHAGE  Take 1 tablet (500 mg total) by mouth 2 (two) times daily with a meal.     simvastatin 20 MG tablet  Commonly known as:  ZOCOR  Take 1 tablet (20 mg total) by mouth daily at 6 PM.        Disposition and follow-up:   [name] was discharged from [facility] in Stable condition.  At the hospital follow up visit please address:  1.  RLQ abscess resolution; any signs of recurrent infection?  2.  Labs / imaging needed at time of follow-up: none  3.  Pending labs/ test needing follow-up: none  Follow-up Appointments: Follow-up Information   Follow up with Fransisca Kaufmann, MD On 05/30/2013. (10a)    Specialty:  Internal Medicine   Contact information:   [place] [address] [phone]       Discharge Instructions: Discharge Orders   Future Appointments Provider Department Dept Phone   05/30/2013 10:00 AM Othella Boyer, MD [place] [phone]   Future Orders Complete By Expires   Call MD for:  redness, tenderness, or signs of  infection (pain, swelling, redness, odor or green/yellow discharge around incision site)  As directed    Call MD for:  temperature >100.4  As directed    Change dressing (specify)  As directed    Comments:     Dressing change: 2 times per day using dressing provided by nurse (wet to dry) until follow-up appointment.   Diet - low sodium heart healthy  As directed    Increase activity slowly  As directed       Consultations: wound care, general surgery- Dr. Brantley Stage  Procedures Performed:  US Abdomen Limited  05/13/2013   CLINICAL DATA:  Question abdominal wall abscess.  EXAM: LIMITED ULTRASOUND OF ABDOMINAL SOFT TISSUES  TECHNIQUE: Ultrasound examination of the abdominal wall soft tissues was performed in the area of clinical concern.  COMPARISON:  None.  FINDINGS: Ultrasound was  performed in the area of concern. There is no focal fluid collection in the right lower quadrant subcutaneous soft tissues to suggest abdominal wall abscess.  IMPRESSION: No evidence of abdominal wall abscess in the area of concern.   Electronically Signed   By: Rolm Baptise M.D.   On: 05/13/2013 20:01    Admission HPI:  [name] is a 37 y.o. female with a PMHx of uncontrolled T2DM, necrotizing fasciitis of left groin status post OR debridement in March 2014, uncontrolled hypothyroidism, HTN. HLD, Obesity, OSA on CPAP, Anxiety and depression, who presented to clinic today for abdominal skin infection  Patient reports that she started to notice a small "skin boil" on RLQ abdominal area one week ago, which quickly progressed to a bigger area of redness and swelling. The swelling later evolved into an open wound, which started draining pus 3 days prior to admission. She reports feeling general malaise, with subjective fevers. She also reports increased pain around the region in the right lower abdominal quadrant over the past days. She states," I feel bad". Denies chest pain, pressure, palpitation, SOB, nausea, vomiting, diarrhea, constipation and weakness. Denies sick contact. Denies injury, trauma, animal or insect bites to her abdomen area. She applied some first aid dressing on her drainage site without taking any medications. She was evaluated today in the clinic from where she was admitted for further evaluation and treatment. Patient has limited access to healthcare due to lack of insurance and she also has been noticed to be noncompliant with her medications, including insulin.  Hospital Course by problem list: 1. Abscess, cellulitis of right lower abdomen- Patient presented with draining wound, surrounding erythema of skin, and subjective fevers at home.  Exam findings consistent with soft tissue infection of the right lower abdomen.  Of note, patient has history of necrotizing  fasciitis of left groin s/p debridement in 07/2012.  This admission, she was afebrile, no leucocytosis, did not meet criteria for sepsis.  She was given clindamycin IV 600 mg q8h while inpatient then converted to clindamycin PO 300 mg TID x 10 additional days at discharge.  Abdominal ultrasound showed no focal fluid collection in RLQ subcutaneous soft tissues to suggest abdominal wall abscess. However, wound care saw patient and recommended general surgery consult given large area of induration surrounding wound. General surgery did bedside I&D with wound culture.  Wound culture returned as staph aureus with sensitivity to clindamycin.  Blood cultures x 2 NGTD.  Nursing taught patient how to do wet to dry dressing changes which she was instructed to do BID until follow-up in [facility].  Pain was controlled with alternating morphine 2  mg IV q6h prn and Norco 5-325 mg PO 1-2 tabs q6h while inpatient; patient discharged with rx for Vicodin 5-325 mg 1 tab q6h prn pain #30.  She was enrolled in Match program and given prescriptions for chronic medications in addition to antibiotics (explained that Match does not cover narcotics) in hopes that she will purchase and take these medications at low prices.   2. Uncontrolled DM2- A1C 12.5% on 04/16/13, medication noncompliance due to financial constraints.  Gave Lantus 15 units daily, SSI Moderate while inpatient.  Gave printed rx for Lantus and metformin at discharge.   3. Hypertension: normotensive this admission.  Continued lisinopril 5 mg daily while inpatient and at discharge (with new rx for this medication as well).   4. Hyperlipidemia- LDL 131 on 04/25/13.  Patient supposed to be taking pravastatin 40 mg daily at home, placed on simvistatin 20 mg daily while inpatient (formulary), given rx for this at discharge.   5. Hypothyroidism- Last TSH 5.34 on 04/25/13.  Continued Synthroid 25 mcg while inpatient, will need outpatient follow-up especially given non-compliance.   Gave rx for one month supply of Synthroid at discharge.    Discharge Vitals:   BP 118/75  Pulse 86  Temp(Src) 97.8 F (36.6 C) (Oral)  Resp 18  Ht 5\' 10"  (1.778 m)  Wt 307 lb 3.2 oz (139.345 kg)  BMI 44.08 kg/m2  SpO2 98%  Discharge Labs:  No results found for this or any previous visit (from the past 24 hour(s)).  Signed: Ivin Poot, MD 05/18/2013, 12:12 PM   Time Spent on Discharge: 40 minutes Services Ordered on Discharge: none Equipment Ordered on Discharge: none

## 2013-05-15 NOTE — Progress Notes (Signed)
Patient discharged to home at approximately 1610. Patient discharge instructions and prescriptions  given and reviewed by Mathews Robinsons. Patient verbalized understanding of discharge instructions and how to manage and change right lower abdominal wound .                            Mliss Sax

## 2013-05-15 NOTE — Progress Notes (Signed)
ANTICOAGULATION CONSULT NOTE - Initial Consult  Pharmacy Consult for Lovenox Indication: VTE prophylaxis  Allergies  Allergen Reactions  . Vancomycin     Acute renal failure suspected secondary to vanco    Patient Measurements: Height: 5\' 10"  (177.8 cm) Weight: 307 lb 3.2 oz (139.345 kg) IBW/kg (Calculated) : 68.5   Vital Signs: Temp: 97.8 F (36.6 C) (12/24 1142) Temp src: Oral (12/24 1142) BP: 118/75 mmHg (12/24 1142) Pulse Rate: 86 (12/24 1142)  Labs:  Recent Labs  05/13/13 1638 05/14/13 1115 05/15/13 0431  HGB 10.7* 10.5*  --   HCT 31.3* 30.5*  --   PLT 266 278  --   CREATININE 0.67 0.70 0.79    Estimated Creatinine Clearance: 148.6 ml/min (by C-G formula based on Cr of 0.79).   Medical History: Past Medical History  Diagnosis Date  . Hyperlipidemia   . Hypertension   . Abscess     history of multiple abscesses  . Obesity   . Peripheral neuropathy   . Abnormal Pap smear of cervix 2009  . Endocarditis 2002    subacute bacterial endocarditis.   . Edema of lower extremity   . Family history of anesthesia complication     "my mom has a hard time coming out from under"  . Heart murmur   . Chronic bronchitis     "get it q yr" (05/13/2013)  . OSA on CPAP   . Hypothyroidism   . Type II diabetes mellitus   . Anemia of chronic disease 2002  . History of blood transfusion     "just low blood count" (05/13/2013)  . GERD (gastroesophageal reflux disease)   . Chronic pain   . Anxiety   . Depression   . Bipolar disorder   . CHF (congestive heart failure)     pt denies this hx on 05/13/2013    Assessment: Patient is a 37 y.o F with RLQ abdominal wall abscess s/p I&D on 12/23.  Patient has good renal function and BMI >30.    Goal of Therapy:  Anti-Xa level 0.3-0.6 (4 hr level) Monitor platelets by anticoagulation protocol: Yes   Plan:  1) change lovenox dose to 60mg  SQ daily (adjusted dose for high BMI) for VTE prophylaxis   Arrick Dutton  P 05/15/2013,1:54 PM

## 2013-05-15 NOTE — Progress Notes (Signed)
  Subjective: Pt ok  Objective: Vital signs in last 24 hours: Temp:  [97.2 F (36.2 C)-97.9 F (36.6 C)] 97.9 F (36.6 C) (12/24 0750) Pulse Rate:  [78-86] 82 (12/24 0750) Resp:  [18-20] 18 (12/24 0750) BP: (106-137)/(61-99) 121/80 mmHg (12/24 0750) SpO2:  [90 %-99 %] 97 % (12/24 0750) Weight:  [302 lb 1.6 oz (137.032 kg)-307 lb 3.2 oz (139.345 kg)] 307 lb 3.2 oz (139.345 kg) (12/24 0639) Last BM Date: 05/13/13  Intake/Output from previous day: 12/23 0701 - 12/24 0700 In: 840 [P.O.:840] Out: -  Intake/Output this shift:    Incision/Wound:RLQ wound open and some fibrinous drainage.  Min induration.    Lab Results:   Recent Labs  05/13/13 1638 05/14/13 1115  WBC 5.5 5.4  HGB 10.7* 10.5*  HCT 31.3* 30.5*  PLT 266 278   BMET  Recent Labs  05/14/13 1115 05/15/13 0431  NA 136 131*  K 3.9 4.9  CL 98 96  CO2 29 24  GLUCOSE 213* 341*  BUN 15 21  CREATININE 0.70 0.79  CALCIUM 9.0 9.0   PT/INR No results found for this basename: LABPROT, INR,  in the last 72 hours ABG No results found for this basename: PHART, PCO2, PO2, HCO3,  in the last 72 hours  Studies/Results: US Abdomen Limited  05/13/2013   CLINICAL DATA:  Question abdominal wall abscess.  EXAM: LIMITED ULTRASOUND OF ABDOMINAL SOFT TISSUES  TECHNIQUE: Ultrasound examination of the abdominal wall soft tissues was performed in the area of clinical concern.  COMPARISON:  None.  FINDINGS: Ultrasound was performed in the area of concern. There is no focal fluid collection in the right lower quadrant subcutaneous soft tissues to suggest abdominal wall abscess.  IMPRESSION: No evidence of abdominal wall abscess in the area of concern.   Electronically Signed   By: Rolm Baptise M.D.   On: 05/13/2013 20:01    Anti-infectives: Anti-infectives   Start     Dose/Rate Route Frequency Ordered Stop   05/13/13 1730  clindamycin (CLEOCIN) IVPB 600 mg     600 mg 100 mL/hr over 30 Minutes Intravenous 3 times per day  05/13/13 1650        Assessment/Plan: I and D RLQ abscess  Wound care Wet to dry BID  Leon Montoya A. 05/15/2013

## 2013-05-15 NOTE — Progress Notes (Signed)
Inpatient Diabetes Program Recommendations  AACE/ADA: New Consensus Statement on Inpatient Glycemic Control (2013)  Target Ranges:  Prepandial:   less than 140 mg/dL      Peak postprandial:   less than 180 mg/dL (1-2 hours)      Critically ill patients:  140 - 180 mg/dL  Results for GURJOT, THIELEMANN (MRN [id number]) as of 05/15/2013 10:52  Ref. Range 05/14/2013 07:39 05/14/2013 11:20 05/14/2013 17:04 05/14/2013 22:18 05/15/2013 07:48  Glucose-Capillary Latest Range: 70-99 mg/dL 323 (H) 197 (H) 363 (H) 288 (H) 298 (H)     Inpatient Diabetes Program Recommendations Insulin - Basal: Please consider increasing Lantus to 20 units QHS. Correction (SSI): Please consider adding Novolog bedtime correction scale. Insulin - Meal Coverage: Please consider ordering Novolog 5 units TID with meals for meal coverage (in addition to Novolog correction).  Thanks, Barnie Alderman, RN, MSN, CCRN Diabetes Coordinator Inpatient Diabetes Program [phone] (Team Pager) [phone] ([facility] office) [phone] [facility] office)

## 2013-05-15 NOTE — Progress Notes (Signed)
Internal Medicine Attending  Date: 05/15/2013  Patient name: [name] [name] Medical record number: [id number] Date of birth: 1975/06/22 Age: 37 y.o. Gender: female I saw and evaluated the patient. I reviewed the resident's note by Dr. Stann Mainland and I agree with the resident's findings and plans as documented in her note.

## 2013-05-15 NOTE — Plan of Care (Signed)
Problem: Phase II Progression Outcomes Goal: Wound without signs/symptoms of infection, decreasing edema Outcome: Not Met (add Reason) Wound bed remains to have yellow tissue 80% . Wound requires packing and tissue around wound hard .   Problem: Phase III Progression Outcomes Goal: Wound care performed by pt/family Outcome: Adequate for Discharge Patient observed RN clean and change dressing . Patient unable to reach without seeing in mirror . Patient reported and verbalized understanding of how to clean and dress wound .   Problem: Discharge Progression Outcomes Goal: Wound improving/decreased edema Outcome: Completed/Met Date Met:  05/15/13 I and D performed 05-13-13 by MD per patient report.

## 2013-05-15 NOTE — Progress Notes (Signed)
Patient discharge teaching given, including activity, diet, follow-up appoints, and medications. Patient verbalized understanding of all discharge instructions.  Pt to be escorted out by NT, to be driven home by family.  Jillyn Ledger, MBA, BS, RN

## 2013-05-15 NOTE — Consult Note (Signed)
Surgery team evaluated patient 05/14/13 and performed I &D.  Wound care per surgery team at this point.  WOC will not follow.   Re consult if needed, will not follow at this time. Thanks  [name], [place] [phone])

## 2013-05-15 NOTE — Progress Notes (Signed)
Subjective: [name] [name] was out of bed applying makeup this morning.  She is still complaining of pain at wound site but is ready to go home today.     Objective: Vital signs in last 24 hours: Filed Vitals:   05/14/13 1800 05/14/13 2222 05/15/13 0639 05/15/13 0750  BP: 127/99 109/61 137/93 121/80  Pulse: 82 78 86 82  Temp: 97.7 F (36.5 C) 97.2 F (36.2 C) 97.7 F (36.5 C) 97.9 F (36.6 C)  TempSrc: Oral Axillary Oral Oral  Resp: 18 20 19 18   Height:  5\' 10"  (1.778 m)    Weight:  302 lb 1.6 oz (137.032 kg) 307 lb 3.2 oz (139.345 kg)   SpO2: 95% 94% 90% 97%   Weight change: 1 lb 11.2 oz (0.771 kg)  Intake/Output Summary (Last 24 hours) at 05/15/13 [id number] Last data filed at 05/14/13 1800  Gross per 24 hour  Intake    600 ml  Output      0 ml  Net    600 ml   PEX General: alert, cooperative, and in no apparent distress HEENT: NCAT Neck: supple, no lymphadenopathy Lungs: clear to ascultation bilaterally, normal work of respiration, no wheezes, rales, ronchi Heart: regular rate and rhythm, no murmurs, gallops, or rubs Abdomen: RLQ with warmth to touch, TTP, mild induration but improved from yesterday, no erythema and bandage in place thus discharge not appreciated this morning; soft, non-tender, non-distended, normal bowel sounds Extremities: 2+ DP/PT pulses bilaterally, no cyanosis, clubbing, or edema Neurologic: alert & oriented X3, cranial nerves II-XII intact, strength grossly intact, sensation intact to light touch  Lab Results: Basic Metabolic Panel:  Recent Labs Lab 05/13/13 1638 05/14/13 1115 05/15/13 0431  NA 134* 136 131*  K 4.0 3.9 4.9  CL 96 98 96  CO2 30 29 24   GLUCOSE 338* 213* 341*  BUN 13 15 21   CREATININE 0.67 0.70 0.79  CALCIUM 8.8 9.0 9.0  MG 2.0  --   --    Liver Function Tests:  Recent Labs Lab 05/13/13 1638  AST 10  ALT 9  ALKPHOS 90  BILITOT 0.2*  PROT 7.3  ALBUMIN 2.7*   CBC:  Recent Labs Lab 05/13/13 1638 05/14/13 1115    WBC 5.5 5.4  NEUTROABS 2.2 2.8  HGB 10.7* 10.5*  HCT 31.3* 30.5*  MCV 80.3 80.3  PLT 266 278   CBG:  Recent Labs Lab 05/13/13 2105 05/14/13 0739 05/14/13 1120 05/14/13 1704 05/14/13 2218 05/15/13 0748  GLUCAP 299* 323* 197* 363* 288* 298*    Micro Results: Recent Results (from the past 240 hour(s))  CULTURE, BLOOD (ROUTINE X 2)     Status: None   Collection Time    05/13/13  6:40 PM      Result Value Range Status   Specimen Description BLOOD LEFT ARM   Final   Special Requests BOTTLES DRAWN AEROBIC AND ANAEROBIC 10CC   Final   Culture  Setup Time     Final   Value: 05/13/2013 23:38     Performed at [facility]   Culture     Final   Value:        BLOOD CULTURE RECEIVED NO GROWTH TO DATE CULTURE WILL BE HELD FOR 5 DAYS BEFORE ISSUING A FINAL NEGATIVE REPORT     Performed at [facility]   Report Status PENDING   Incomplete  CULTURE, BLOOD (ROUTINE X 2)     Status: None   Collection Time  05/13/13  6:52 PM      Result Value Range Status   Specimen Description BLOOD RIGHT ARM   Final   Special Requests BOTTLES DRAWN AEROBIC AND ANAEROBIC 10CC   Final   Culture  Setup Time     Final   Value: 05/13/2013 23:39     Performed at [facility]   Culture     Final   Value:        BLOOD CULTURE RECEIVED NO GROWTH TO DATE CULTURE WILL BE HELD FOR 5 DAYS BEFORE ISSUING A FINAL NEGATIVE REPORT     Performed at [facility]   Report Status PENDING   Incomplete  MRSA PCR SCREENING     Status: None   Collection Time    05/13/13  7:12 PM      Result Value Range Status   MRSA by PCR NEGATIVE  NEGATIVE Final   Comment:            The GeneXpert MRSA Assay (FDA     approved for NASAL specimens     only), is one component of a     comprehensive MRSA colonization     surveillance program. It is not     intended to diagnose MRSA     infection nor to guide or     monitor treatment for     MRSA infections.  CULTURE, ROUTINE-ABSCESS     Status:  None   Collection Time    05/14/13  2:08 PM      Result Value Range Status   Specimen Description ABSCESS ABDOMEN   Final   Special Requests RLQ   Final   Gram Stain     Final   Value: MODERATE WBC PRESENT,BOTH PMN AND MONONUCLEAR     NO SQUAMOUS EPITHELIAL CELLS SEEN     MODERATE GRAM POSITIVE COCCI     IN PAIRS     Performed at [facility]   Culture     Final   Value: Culture reincubated for better growth     Performed at [facility]   Report Status PENDING   Incomplete   Studies/Results: US Abdomen Limited  05/13/2013   CLINICAL DATA:  Question abdominal wall abscess.  EXAM: LIMITED ULTRASOUND OF ABDOMINAL SOFT TISSUES  TECHNIQUE: Ultrasound examination of the abdominal wall soft tissues was performed in the area of clinical concern.  COMPARISON:  None.  FINDINGS: Ultrasound was performed in the area of concern. There is no focal fluid collection in the right lower quadrant subcutaneous soft tissues to suggest abdominal wall abscess.  IMPRESSION: No evidence of abdominal wall abscess in the area of concern.   Electronically Signed   By: Rolm Baptise M.D.   On: 05/13/2013 20:01   Medications: I have reviewed the patient's current medications. Scheduled Meds: . clindamycin (CLEOCIN) IV  600 mg Intravenous Q8H  . enoxaparin (LOVENOX) injection  30 mg Subcutaneous Q24H  . insulin aspart  0-15 Units Subcutaneous TID WC  . insulin glargine  15 Units Subcutaneous QHS  . levothyroxine  25 mcg Oral QAC breakfast  . lisinopril  5 mg Oral Daily  . simvastatin  20 mg Oral q1800   PRN Meds:.HYDROcodone-acetaminophen, morphine injection Assessment/Plan: #Cellulitis of right lower abdomen- Exam findings consistent with soft tissue infection of the right lower quadrant.  Of note, she has history of necrotizing fasciitis of left groin s/p debridement in 07/2012.  Patient is afebrile, no leucocytosis, does not meet criteria for sepsis.  Abdominal ultrasound with no focal fluid  collection in RLQ subcutaneous soft tissues to suggest abdominal wall abscess.  Wound care saw patient yesterday and recommended general surgery consult.  General surgery did bedside I&D yesterday and sent wound culture. Blood cultures NGTD.  -general surgery consult, appreciate recs -continue clindamycin IV 600 mg q8h through this morning then convert to clindamycin 300 mg TID x 10 additional days -Vicodin 5-325 mg 1-2 tabs q6h, morphine 2 mg IV q6h prn through discharge; rx for Vicodin 5-325 mg 1 tab q6h prn pain #30 -nursing to teach patient how to do wet to dry dressing changes which she will do twice daily until [facility] follow-up  -case management arranged Match program for patient; will print for abx as well as other chronic meds in hopes that she will fill and take them   # Uncontrolled DM2- A1C 12.5% on 04/16/13, medication noncompliance due to financial constraints. -Lantus 15 units daily -SSI Moderate  -CBGs AC and qhs  # Hypertension: normotensive now -continue Lisinopril 5 mg daily  # Hypothyroidism- Last TSH 5.34 on 04/25/13. Again, patient noncompliant with home meds. -continue Synthroid 25 mcg, followup as outpatient  Dispo:  Anticipated discharge today.   The patient does have a current PCP Marrion Coy, MD) and does need an [facility] hospital follow-up appointment after discharge.   .Services Needed at time of discharge: Y = Yes, Blank = No PT:   OT:   RN:   Equipment:   Other:     LOS: 2 days   Ivin Poot, MD 05/15/2013, 8:39 AM

## 2013-05-18 LAB — CULTURE, ROUTINE-ABSCESS

## 2013-05-19 LAB — CULTURE, BLOOD (ROUTINE X 2): Culture: NO GROWTH

## 2013-05-30 ENCOUNTER — Encounter: Payer: Self-pay | Admitting: Internal Medicine

## 2013-05-30 ENCOUNTER — Ambulatory Visit: Payer: No Typology Code available for payment source | Admitting: Internal Medicine

## 2013-06-19 ENCOUNTER — Ambulatory Visit (INDEPENDENT_AMBULATORY_CARE_PROVIDER_SITE_OTHER): Payer: No Typology Code available for payment source | Admitting: Internal Medicine

## 2013-06-19 ENCOUNTER — Encounter: Payer: Self-pay | Admitting: Internal Medicine

## 2013-06-19 VITALS — BP 153/97 | HR 90 | Temp 97.6°F | Wt 304.1 lb

## 2013-06-19 DIAGNOSIS — I10 Essential (primary) hypertension: Secondary | ICD-10-CM

## 2013-06-19 DIAGNOSIS — E1310 Other specified diabetes mellitus with ketoacidosis without coma: Secondary | ICD-10-CM

## 2013-06-19 DIAGNOSIS — F329 Major depressive disorder, single episode, unspecified: Secondary | ICD-10-CM

## 2013-06-19 DIAGNOSIS — E785 Hyperlipidemia, unspecified: Secondary | ICD-10-CM

## 2013-06-19 DIAGNOSIS — L03319 Cellulitis of trunk, unspecified: Principal | ICD-10-CM

## 2013-06-19 DIAGNOSIS — E1110 Type 2 diabetes mellitus with ketoacidosis without coma: Secondary | ICD-10-CM

## 2013-06-19 DIAGNOSIS — L02219 Cutaneous abscess of trunk, unspecified: Secondary | ICD-10-CM

## 2013-06-19 DIAGNOSIS — E039 Hypothyroidism, unspecified: Secondary | ICD-10-CM

## 2013-06-19 LAB — GLUCOSE, CAPILLARY: Glucose-Capillary: 213 mg/dL — ABNORMAL HIGH (ref 70–99)

## 2013-06-19 MED ORDER — FLUOXETINE HCL 20 MG PO TABS: 20.0000 mg | ORAL_TABLET | Freq: Every day | ORAL | Status: DC

## 2013-06-19 MED ORDER — LISINOPRIL 5 MG PO TABS: 5.0000 mg | ORAL_TABLET | Freq: Every day | ORAL | Status: DC

## 2013-06-19 MED ORDER — METFORMIN HCL 500 MG PO TABS: 500.0000 mg | ORAL_TABLET | Freq: Two times a day (BID) | ORAL | Status: DC

## 2013-06-19 MED ORDER — SIMVASTATIN 20 MG PO TABS: 20.0000 mg | ORAL_TABLET | Freq: Every day | ORAL | Status: DC

## 2013-06-19 MED ORDER — LEVOTHYROXINE SODIUM 25 MCG PO TABS: 25.0000 ug | ORAL_TABLET | Freq: Every day | ORAL | Status: DC

## 2013-06-19 MED ORDER — INSULIN GLARGINE 100 UNIT/ML ~~LOC~~ SOLN: 17.0000 [IU] | Freq: Every day | SUBCUTANEOUS | Status: DC

## 2013-06-19 NOTE — Assessment & Plan Note (Signed)
BP Readings from Last 3 Encounters:  06/19/13 153/97  05/15/13 118/75  05/13/13 150/91    Lab Results  Component Value Date   NA 131* 05/15/2013   K 4.9 05/15/2013   CREATININE 0.79 05/15/2013    Assessment: Blood pressure control: moderately elevated (not taking her lisinopril) Progress toward BP goal:  deteriorated   Plan: Medications:  Restarting the Lisinopril 5mg  daily. A prescription was provided for the patient to take to the Health department or to [facility] as Lisinopril is on the $4 formulary.  Educational resources provided:   Self management tools provided:   Other plans: F/u in 1 month for BP check.

## 2013-06-19 NOTE — Progress Notes (Signed)
Patient ID: [name] [name], female   DOB: Jun 20, 1975, 38 y.o.   MRN: [id number]  Subjective:   Patient ID: [name] [name] female   DOB: 1976/01/04 38 y.o.   MRN: [id number]  HPI: [name] [name] is a 37 y.o.  F w/ PMH uncontrolled T2DM, necrotizing fasciitis of left groin status post OR debridement in March 2014, uncontrolled hypothyroidism, HTN. HLD, Obesity, OSA on CPAP, Anxiety and depression, who presents today for a hospital f/u after being admitted for a RUQ abdominal abscess.  She was admitted on 12/22 and d/c'd on 12/24 after being treated with clindamycin IV 600 mg q8h while inpatient then converted to clindamycin PO 300 mg TID x 10 additional days at discharge. She endorses compliance with the abx and completed the course. She states that her abscess has completely healed.   At the time of her admission, she was noncompliant with her diabetes regimen 2/2 fiancial constraints and was started on Lantus and Metformin at hospital d/c.  She was to take 15u Lantus qhs and 500mg  of the Metformin BID. Today, she does endorse compliance with her insulin and Metformin. However, she states that her CBGs are all in the 300s taken at various times a day. She is using her mother's meter.   She is not taking her Lisinopril as she diod not get it filled after her hospital d/c.   She reports feelings of depression and fatigue. She states that she even has trouble getting motivated to go to clinic appts. She is trying to get in touch with [facility], who she was seeing for her mental health and depression in the past. She is supposed to be taking buspirone and fluoxetine but is not, as she has not gotten these filled either.    Past Medical History  Diagnosis Date  . Hyperlipidemia   . Hypertension   . Abscess     history of multiple abscesses  . Obesity   . Peripheral neuropathy   . Abnormal Pap smear of cervix 2009  . Endocarditis 2002    subacute bacterial  endocarditis.   . Edema of lower extremity   . Family history of anesthesia complication     "my mom has a hard time coming out from under"  . Heart murmur   . Chronic bronchitis     "get it q yr" (05/13/2013)  . OSA on CPAP   . Hypothyroidism   . Type II diabetes mellitus   . Anemia of chronic disease 2002  . History of blood transfusion     "just low blood count" (05/13/2013)  . GERD (gastroesophageal reflux disease)   . Chronic pain   . Anxiety   . Depression   . Bipolar disorder   . CHF (congestive heart failure)     pt denies this hx on 05/13/2013   Current Outpatient Prescriptions  Medication Sig Dispense Refill  . busPIRone (BUSPAR) 10 MG tablet Take 1 tablet (10 mg total) by mouth 2 (two) times daily.  60 tablet  0  . clindamycin (CLEOCIN) 300 MG capsule Take 1 capsule (300 mg total) by mouth 3 (three) times daily.  30 capsule  0  . FLUoxetine (PROZAC) 20 MG tablet Take 2 tablets (40 mg total) by mouth daily.  60 tablet  0  . gabapentin (NEURONTIN) 300 MG capsule Take 300 mg by mouth 3 (three) times daily.      Marland Kitchen HYDROcodone-acetaminophen (NORCO/VICODIN) 5-325 MG per tablet Take 1 tablet by mouth  every 6 (six) hours as needed for moderate pain.  30 tablet  0  . hydrOXYzine (ATARAX/VISTARIL) 50 MG tablet Take 0.5 tablets (25 mg total) by mouth 3 (three) times daily as needed for itching or anxiety.  60 tablet  0  . insulin aspart (NOVOLOG) 100 UNIT/ML injection Inject 3-7 Units into the skin 3 (three) times daily with meals. *per sliding scale      . insulin glargine (LANTUS) 100 UNIT/ML injection Inject 0.15 mLs (15 Units total) into the skin at bedtime.  10 mL  12  . levothyroxine (SYNTHROID, LEVOTHROID) 25 MCG tablet Take 1 tablet (25 mcg total) by mouth daily before breakfast.  30 tablet  0  . lisinopril (PRINIVIL,ZESTRIL) 5 MG tablet Take 1 tablet (5 mg total) by mouth daily.  30 tablet  0  . metFORMIN (GLUCOPHAGE) 500 MG tablet Take 1 tablet (500 mg total) by mouth 2  (two) times daily with a meal.  106 tablet  0  . simvastatin (ZOCOR) 20 MG tablet Take 1 tablet (20 mg total) by mouth daily at 6 PM.  30 tablet  0   No current facility-administered medications for this visit.   Family History  Problem Relation Age of Onset  . Heart failure Mother   . Diabetes Mother   . Kidney disease Mother   . Kidney disease Father   . Diabetes Paternal Grandmother   . Heart failure Paternal Grandmother    History   Social History  . Marital Status: Single    Spouse Name: N/A    Number of Children: N/A  . Years of Education: N/A   Occupational History  . telemarketer     Phone surveys   Social History Main Topics  . Smoking status: Current Every Day Smoker -- 0.25 packs/day for 6 years    Types: Cigarettes  . Smokeless tobacco: Never Used     Comment: "TRYING TO QUIT"  . Alcohol Use: No  . Drug Use: No  . Sexual Activity: Not Currently   Other Topics Concern  . None   Social History Narrative   Lives with mother currently,    Review of Systems: Constitutional: Denies fever, chills. + fatigue.  HEENT: Denies congestion, sore throat,neck pain  Respiratory: +dry cough. Denies SOB, DOE  Cardiovascular: Denies chest pain  Gastrointestinal: +abdominal pain with metformin. Denies nausea, vomiting, diarrhea, constipation Genitourinary: +polyuria. Denies dysuria, hematuria Musculoskeletal: Denies myalgias, back pain, joint swelling, arthralgias and gait problem.  Skin: Denies new abscess sites. Previous abscess site healing well.  Neurological: +neuropathic pain. Denies dizziness, syncope Psychiatric/Behavioral: +depression. Denies suicidal ideation  Objective:  Physical Exam: Filed Vitals:   06/19/13 0957  BP: 153/97  Pulse: 90  Temp: 97.6 F (36.4 C)  TempSrc: Oral  Weight: 304 lb 1.6 oz (137.939 kg)  SpO2: 96%   Constitutional: Vital signs reviewed.  Patient is a well-developed and well-nourished female in no acute distress and  cooperative with exam. Alert and oriented x3.  Head: Normocephalic and atraumatic Eyes: PERRL, EOMI, conjunctivae normal, No scleral icterus.  Neck: Supple, trachea midline Cardiovascular: RRR, no MRG Pulmonary/Chest: normal respiratory effort, CTAB, no wheezes, rales, or rhonchi Abdominal: Soft. Non-tender, non-distended Musculoskeletal: Moves all 4 extremities Neurological: A&O x3, no focal neurological deficits Skin: Warm, dry and intact.  Abscess site well healed with hypopigmentation surrounded by a thin ring or hyperpigmentation with mild induration, no fluctuance and non-tender to palpation. Psychiatric: Somewhat depressed mood and mildly flattened affect.   Assessment & Plan:  Please refer to Problem List based Assessment and Plan

## 2013-06-19 NOTE — Patient Instructions (Signed)
Increase your Lantus to 17 units a day. Try to check your blood sugars 2 times a day before meals in the morning and at night. Please bring a record of your blood sugars with you to your next clinic visit.   [facility] and ask them about the MAP program.   Return to the clinic in 1 month.

## 2013-06-19 NOTE — Assessment & Plan Note (Addendum)
Pt reports that she completed her complete antibiotic course and denies any oozing of the site. On exam, the abscess site is well healed with some hypopigmentation. Mild induration at the site but no tenderness and no fluctuance. Her abscess is well healed. No further intervention is required at this time.

## 2013-06-19 NOTE — Assessment & Plan Note (Addendum)
Lab Results  Component Value Date   HGBA1C 12.5 04/16/2013   HGBA1C 15.4* 07/06/2012   HGBA1C 10.5 01/02/2012     Assessment: Diabetes control: poor control (HgbA1C >9%) Progress toward A1C goal:  unchanged Comments: Pt taking lantus 15u daily and Metformin 500mg  BID and does endorse some abdominal discomfort with the metformin but denies any N/V or diarrhea.  Plan: Medications:  Will continue the Metformin 500mg  BID and not uptitrate do to concerns for worseing GI symptoms. Will slightly increase her Lanuts to 17 units daily as her reprorted CBGs at home are all in the 200-300 range and her CBG here was 213 fasting.  Home glucose monitoring: Frequency:  Sporadically Timing:   Instruction/counseling given: reminded to bring blood glucose meter & log to each visit and reminded to bring medications to each visit. Educational resources provided:   Self management tools provided:   Other plans: F/u in 1 month- she was asked to check her CBGs at least once a day when she 1st wakes up in the morning before breakfast and to keep a log of her readings to bring with her to clinic.

## 2013-06-19 NOTE — Assessment & Plan Note (Addendum)
Pt endorses feeling down and lack of motivation to do things with decreased energy. She denies suicidal ideation or thoughts of self harm. She was previously on Prozac and Buspar but ran out of the medications and did not get them refilled 2/2 financial constraints. She has not taken the medications since her hospital d/c. She would like to restart her medications. Will start the Prozac at 20mg , which will likely need to be up titrated for efficacy. She was advised that suddenly stopping her antidepressants is dangerous, and acknowledged understanding.  - Prozac 20mg  daily

## 2013-06-19 NOTE — Assessment & Plan Note (Signed)
Synthroid prescription was provided to her today to take to the pharmacy.

## 2013-06-19 NOTE — Assessment & Plan Note (Signed)
She has been out of her statin, so I provided her with a prescription today [facility].

## 2013-06-20 NOTE — Progress Notes (Signed)
Case discussed with Dr. Glenn soon after the resident saw the patient.  We reviewed the resident's history and exam and pertinent patient test results.  I agree with the assessment, diagnosis, and plan of care documented in the resident's note. 

## 2013-07-23 ENCOUNTER — Ambulatory Visit (INDEPENDENT_AMBULATORY_CARE_PROVIDER_SITE_OTHER): Payer: No Typology Code available for payment source | Admitting: Internal Medicine

## 2013-07-23 ENCOUNTER — Other Ambulatory Visit: Payer: Self-pay | Admitting: Internal Medicine

## 2013-07-23 VITALS — BP 146/95 | HR 93 | Temp 97.8°F | Wt 306.9 lb

## 2013-07-23 DIAGNOSIS — Z9119 Patient's noncompliance with other medical treatment and regimen: Secondary | ICD-10-CM

## 2013-07-23 DIAGNOSIS — Z91199 Patient's noncompliance with other medical treatment and regimen due to unspecified reason: Secondary | ICD-10-CM

## 2013-07-23 DIAGNOSIS — E1310 Other specified diabetes mellitus with ketoacidosis without coma: Secondary | ICD-10-CM

## 2013-07-23 DIAGNOSIS — E1110 Type 2 diabetes mellitus with ketoacidosis without coma: Secondary | ICD-10-CM

## 2013-07-23 LAB — COMPLETE METABOLIC PANEL WITH GFR
ALBUMIN: 3.1 g/dL — AB (ref 3.5–5.2)
ALT: 12 U/L (ref 0–35)
AST: 10 U/L (ref 0–37)
Alkaline Phosphatase: 116 U/L (ref 39–117)
BUN: 14 mg/dL (ref 6–23)
CO2: 25 meq/L (ref 19–32)
Calcium: 9.6 mg/dL (ref 8.4–10.5)
Chloride: 99 mEq/L (ref 96–112)
Creat: 0.89 mg/dL (ref 0.50–1.10)
GFR, EST NON AFRICAN AMERICAN: 83 mL/min
GLUCOSE: 467 mg/dL — AB (ref 70–99)
POTASSIUM: 4.4 meq/L (ref 3.5–5.3)
Sodium: 137 mEq/L (ref 135–145)
Total Protein: 7.5 g/dL (ref 6.0–8.3)

## 2013-07-23 LAB — URINALYSIS, ROUTINE W REFLEX MICROSCOPIC
BILIRUBIN URINE: NEGATIVE
Glucose, UA: 1000 mg/dL — AB
KETONES UR: NEGATIVE mg/dL
Leukocytes, UA: NEGATIVE
NITRITE: NEGATIVE
PH: 5.5 (ref 5.0–8.0)
Protein, ur: 30 mg/dL — AB
SPECIFIC GRAVITY, URINE: 1.029 (ref 1.005–1.030)
Urobilinogen, UA: 0.2 mg/dL (ref 0.0–1.0)

## 2013-07-23 LAB — URINALYSIS, MICROSCOPIC ONLY

## 2013-07-23 LAB — POCT GLYCOSYLATED HEMOGLOBIN (HGB A1C): Hemoglobin A1C: 14

## 2013-07-23 LAB — HM DIABETES EYE EXAM

## 2013-07-23 LAB — GLUCOSE, CAPILLARY: Glucose-Capillary: 443 mg/dL — ABNORMAL HIGH (ref 70–99)

## 2013-07-23 MED ORDER — INSULIN GLARGINE 100 UNIT/ML ~~LOC~~ SOLN: 15.0000 [IU] | Freq: Every day | SUBCUTANEOUS | Status: DC

## 2013-07-23 NOTE — Progress Notes (Signed)
Subjective:   Patient ID: [name] [name] female   DOB: 09/22/1975 38 y.o.   MRN: [id number]  HPI: [name] [name] is a 38 y.o. woman with a pmhx of uncontrolled DM, depression and medication noncompliance. The patient has not been able to afford her medications. She does not currently work so she states that she has no money. She reports that she has not asked her family for money for her medications. Previously her family has helped her afford her medications. She has not been checking her blood sugar. She reports abdominal cramping for the last few days. She endorses polyuria and polydipsia. She reports increasing malaise and weakness.     Past Medical History  Diagnosis Date  . Hyperlipidemia   . Hypertension   . Abscess     history of multiple abscesses  . Obesity   . Peripheral neuropathy   . Abnormal Pap smear of cervix 2009  . Endocarditis 2002    subacute bacterial endocarditis.   . Edema of lower extremity   . Family history of anesthesia complication     "my mom has a hard time coming out from under"  . Heart murmur   . Chronic bronchitis     "get it q yr" (05/13/2013)  . OSA on CPAP   . Hypothyroidism   . Type II diabetes mellitus   . Anemia of chronic disease 2002  . History of blood transfusion     "just low blood count" (05/13/2013)  . GERD (gastroesophageal reflux disease)   . Chronic pain   . Anxiety   . Depression   . Bipolar disorder   . CHF (congestive heart failure)     pt denies this hx on 05/13/2013   Current Outpatient Prescriptions  Medication Sig Dispense Refill  . busPIRone (BUSPAR) 10 MG tablet Take 1 tablet (10 mg total) by mouth 2 (two) times daily.  60 tablet  0  . FLUoxetine (PROZAC) 20 MG tablet Take 1 tablet (20 mg total) by mouth daily.  30 tablet  3  . gabapentin (NEURONTIN) 300 MG capsule Take 300 mg by mouth 3 (three) times daily.      Marland Kitchen HYDROcodone-acetaminophen (NORCO/VICODIN) 5-325 MG per tablet Take 1 tablet by  mouth every 6 (six) hours as needed for moderate pain.  30 tablet  0  . hydrOXYzine (ATARAX/VISTARIL) 50 MG tablet Take 0.5 tablets (25 mg total) by mouth 3 (three) times daily as needed for itching or anxiety.  60 tablet  0  . insulin aspart (NOVOLOG) 100 UNIT/ML injection Inject 3-7 Units into the skin 3 (three) times daily with meals. *per sliding scale      . insulin glargine (LANTUS) 100 UNIT/ML injection Inject 0.17 mLs (17 Units total) into the skin at bedtime.  10 mL  12  . levothyroxine (SYNTHROID, LEVOTHROID) 25 MCG tablet Take 1 tablet (25 mcg total) by mouth daily before breakfast.  30 tablet  3  . lisinopril (PRINIVIL,ZESTRIL) 5 MG tablet Take 1 tablet (5 mg total) by mouth daily.  30 tablet  3  . metFORMIN (GLUCOPHAGE) 500 MG tablet Take 1 tablet (500 mg total) by mouth 2 (two) times daily with a meal.  60 tablet  3  . simvastatin (ZOCOR) 20 MG tablet Take 1 tablet (20 mg total) by mouth daily at 6 PM.  30 tablet  3   No current facility-administered medications for this visit.   Family History  Problem Relation Age of Onset  . Heart failure  Mother   . Diabetes Mother   . Kidney disease Mother   . Kidney disease Father   . Diabetes Paternal Grandmother   . Heart failure Paternal Grandmother    History   Social History  . Marital Status: Single    Spouse Name: N/A    Number of Children: N/A  . Years of Education: N/A   Occupational History  . telemarketer     Phone surveys   Social History Main Topics  . Smoking status: Current Every Day Smoker -- 0.25 packs/day for 6 years    Types: Cigarettes  . Smokeless tobacco: Never Used     Comment: "TRYING TO QUIT"  . Alcohol Use: No  . Drug Use: No  . Sexual Activity: Not Currently   Other Topics Concern  . Not on file   Social History Narrative   Lives with mother currently,    Review of Systems:  Review of Systems  Constitutional: Positive for malaise/fatigue. Negative for fever, chills, weight loss and  diaphoresis.  Respiratory: Negative for cough, sputum production and shortness of breath.   Cardiovascular: Negative for chest pain, palpitations, orthopnea, claudication, leg swelling and PND.  Gastrointestinal: Positive for abdominal pain. Negative for nausea, vomiting, diarrhea, constipation, blood in stool and melena.  Genitourinary: Positive for frequency. Negative for dysuria, urgency, hematuria and flank pain.  Musculoskeletal: Negative for myalgias.  Neurological: Positive for weakness and headaches.  Psychiatric/Behavioral: Positive for depression. Negative for suicidal ideas and substance abuse. The patient is nervous/anxious.      Objective:  Physical Exam: Filed Vitals:   07/23/13 1524  BP: 146/95  Pulse: 93  Temp: 97.8 F (36.6 C)  TempSrc: Oral  Weight: 306 lb 14.4 oz (139.209 kg)  SpO2: 98%   Physical Exam  Constitutional: She is oriented to person, place, and time. She appears well-developed and well-nourished. No distress.  Obese female  HENT:  Head: Normocephalic and atraumatic.  Tachy mucous membrane  Cardiovascular: Normal rate, regular rhythm, normal heart sounds and intact distal pulses.  Exam reveals no gallop and no friction rub.   No murmur heard. Pulmonary/Chest: Effort normal and breath sounds normal. No respiratory distress. She has no wheezes. She has no rales. She exhibits no tenderness.  Abdominal: Soft. Bowel sounds are normal. She exhibits no distension. There is no tenderness. There is no rebound and no guarding.  Neurological: She is alert and oriented to person, place, and time.  Skin: She is not diaphoretic.  Psychiatric:  Depressed mood and affect.     Assessment & Plan:

## 2013-07-23 NOTE — Assessment & Plan Note (Addendum)
Lab Results  Component Value Date   HGBA1C 14.0 07/23/2013   HGBA1C 12.5 04/16/2013   HGBA1C 15.4* 07/06/2012     Assessment: Diabetes control: poor control (HgbA1C >9%) Progress toward A1C goal:  deteriorated Comments: Patient is medically noncompliant. I suspect that this is multifactorial. First, she has financial barrier to medication. Second she has depression and appears to lack motivation to find resources to be able to afford her medications. Her CBG is very elevated in clinic today 450's. She does not have ketonuria and AG is 13. Thus DKA is unlikely. However, the patient appears to be at risk for DKA if she continues to be medically noncompliant.   Plan: Medications:  Patient is noncompliant with current regimen. Plan to send new prescription for lantus 15 U daily. Patient states that she will ask her family for money to purchase lantus. Home glucose monitoring: Frequency:  Once daily Timing:   In morning Other plans: Plan to refer patient to [facility] for coaching at next visit in 1 week. I gave the patient a Lantus pen (sample). In order the maximize the duration of therapy I instructed the patient to use 10 U nightly. As there are 300 U per pen, this should give her 30 days. Further, I told the patient to ask for money for lantus. I rec the patient to return for F.U in 1 week to ensure medication compliance and recheck CBG.

## 2013-07-23 NOTE — Patient Instructions (Signed)
Please take 10 U lf lantus from the sample lantus pen each day.  Please ask your family for money to help pay for your insulin. I have sent a new prescription into your pharmacy. Please use 15 U daily of the lantus once you have picked up your new prescription.  Please follow up for a DM recheck in 1 week.

## 2013-07-23 NOTE — Assessment & Plan Note (Signed)
Today I focused on getting the patient to find resources to acquire her insulin.

## 2013-07-24 ENCOUNTER — Encounter: Payer: Self-pay | Admitting: Internal Medicine

## 2013-07-25 ENCOUNTER — Telehealth: Payer: Self-pay | Admitting: Licensed Clinical Social Worker

## 2013-07-25 NOTE — Telephone Encounter (Signed)
During [name] [name]'s last [facility] appt, pt stated she was having difficulty affording Lantus/Diabetes medication.  [name] [name] is uninsured and has the [place].  CSW wanted to provide pt with information to MAP, as Lantus is a medication the are able to assist with.  CSW placed called to pt.  CSW left message requesting return call. CSW provided contact hours and phone number.

## 2013-07-26 ENCOUNTER — Encounter: Payer: Self-pay | Admitting: Licensed Clinical Social Worker

## 2013-07-29 NOTE — Progress Notes (Signed)
Case discussed with Dr. Komanski at the time of the visit.  We reviewed the resident's history and exam and pertinent patient test results.  I agree with the assessment, diagnosis, and plan of care documented in the resident's note.      

## 2013-07-29 NOTE — Progress Notes (Signed)
Patient ID: [name] [name], female   DOB: 20-Jul-1975, 38 y.o.   MRN: [id number] Care management meeting held with PCP, CSW and CDE to discuss pt's barriers.  Pt complains of not being able to afford medications, ie Lantus.  CSW will follow up with [name] regarding the MAP program as pt should be eligible to receive Lantus without cost.  PCP major barrier noted to be mood disorder. Pt has dx of depression, major.  CSW will inquire if pt is active with behavioral health services.  CSW will encourage pt to apply for Disability as pt has major limitations.

## 2013-07-29 NOTE — Telephone Encounter (Signed)
CSW placed call to Ms. Westwood.  CSW discussed the MAP program to help pt with obtaining her medications, especially Lantus.  Pt has not heard of the MAP program and would like more information.  CSW discussed walk-in hours and needed information.  Ms. Canal doubtful she has any financial information from when she applied for her [facility] card.  CSW offered to inquire if Financial counselor has copies.  Pt agreeable.  Ms. Fichter aware that she has an [facility] appt on 07/30/13.  CSW will provide pt with information on MAP and if copies of financial information can be obtained.   CSW discussed PCP concern for depression and inquired if pt was currently receiving services.  Pt states that she was seeing a therapist until Jan 2015, but has yet to see psychiatrist.  Pt is active at [facility].  CSW informed Ms. Yorio will try to obtain appointment for psychiatrist, will ask pt to sign ROI. Ms. Plummer has applied for Disability and was denied, pt has recently obtained legal counsel to help with appeal.  Ms. Draney denies add'l needs at this time.  Pt is aware CSW is available to assist as needed.

## 2013-07-30 ENCOUNTER — Ambulatory Visit (INDEPENDENT_AMBULATORY_CARE_PROVIDER_SITE_OTHER): Payer: No Typology Code available for payment source | Admitting: Internal Medicine

## 2013-07-30 ENCOUNTER — Encounter: Payer: Self-pay | Admitting: Internal Medicine

## 2013-07-30 ENCOUNTER — Encounter: Payer: Self-pay | Admitting: Licensed Clinical Social Worker

## 2013-07-30 VITALS — BP 147/96 | HR 97 | Temp 97.3°F | Ht 70.0 in | Wt 313.9 lb

## 2013-07-30 DIAGNOSIS — IMO0002 Reserved for concepts with insufficient information to code with codable children: Secondary | ICD-10-CM

## 2013-07-30 DIAGNOSIS — IMO0001 Reserved for inherently not codable concepts without codable children: Secondary | ICD-10-CM

## 2013-07-30 DIAGNOSIS — E1165 Type 2 diabetes mellitus with hyperglycemia: Secondary | ICD-10-CM

## 2013-07-30 LAB — GLUCOSE, CAPILLARY: Glucose-Capillary: 186 mg/dL — ABNORMAL HIGH (ref 70–99)

## 2013-07-30 NOTE — Patient Instructions (Signed)
Keep going with the diet and the exercise. I know it's hard but the exercise will help your health.   Here is a sample plan of foods for the diabetes that are easier on your sugars.  Work on getting the lantus so we can bring the sugars down some.   Call us if you have any problems at [phone].  Come back in about 6-8 weeks to check on the sugars.   Diabetes Meal Planning Guide The diabetes meal planning guide is a tool to help you plan your meals and snacks. It is important for people with diabetes to manage their blood glucose (sugar) levels. Choosing the right foods and the right amounts throughout your day will help control your blood glucose. Eating right can even help you improve your blood pressure and reach or maintain a healthy weight. CARBOHYDRATE COUNTING MADE EASY When you eat carbohydrates, they turn to sugar. This raises your blood glucose level. Counting carbohydrates can help you control this level so you feel better. When you plan your meals by counting carbohydrates, you can have more flexibility in what you eat and balance your medicine with your food intake. Carbohydrate counting simply means adding up the total amount of carbohydrate grams in your meals and snacks. Try to eat about the same amount at each meal. Foods with carbohydrates are listed below. Each portion below is 1 carbohydrate serving or 15 grams of carbohydrates. Ask your dietician how many grams of carbohydrates you should eat at each meal or snack. Grains and Starches  1 slice bread.   English muffin or hotdog/hamburger bun.   cup cold cereal (unsweetened).   cup cooked pasta or rice.   cup starchy vegetables (corn, potatoes, peas, beans, winter squash).  1 tortilla (6 inches).   bagel.  1 waffle or pancake (size of a CD).   cup cooked cereal.  4 to 6 small crackers. *Whole grain is recommended. Fruit  1 cup fresh unsweetened berries, melon, papaya, pineapple.  1 small fresh  fruit.   banana or mango.   cup fruit juice (4 oz unsweetened).   cup canned fruit in natural juice or water.  2 tbs dried fruit.  12 to 15 grapes or cherries. Milk and Yogurt  1 cup fat-free or 1% milk.  1 cup soy milk.  6 oz light yogurt with sugar-free sweetener.  6 oz low-fat soy yogurt.  6 oz plain yogurt. Vegetables  1 cup raw or  cup cooked is counted as 0 carbohydrates or a "free" food.  If you eat 3 or more servings at 1 meal, count them as 1 carbohydrate serving. Other Carbohydrates   oz chips or pretzels.   cup ice cream or frozen yogurt.   cup sherbet or sorbet.  2 inch square cake, no frosting.  1 tbs honey, sugar, jam, jelly, or syrup.  2 small cookies.  3 squares of graham crackers.  3 cups popcorn.  6 crackers.  1 cup broth-based soup.  Count 1 cup casserole or other mixed foods as 2 carbohydrate servings.  Foods with less than 20 calories in a serving may be counted as 0 carbohydrates or a "free" food. You may want to purchase a book or computer software that lists the carbohydrate gram counts of different foods. In addition, the nutrition facts panel on the labels of the foods you eat are a good source of this information. The label will tell you how big the serving size is and the total number of carbohydrate  grams you will be eating per serving. Divide this number by 15 to obtain the number of carbohydrate servings in a portion. Remember, 1 carbohydrate serving equals 15 grams of carbohydrate. SERVING SIZES Measuring foods and serving sizes helps you make sure you are getting the right amount of food. The list below tells how big or small some common serving sizes are.  1 oz.........4 stacked dice.  3 oz........Marland KitchenDeck of cards.  1 tsp.......Marland KitchenTip of little finger.  1 tbs......Marland KitchenMarland KitchenThumb.  2 tbs.......Marland KitchenGolf ball.   cup......Marland KitchenHalf of a fist.  1 cup.......Marland KitchenA fist. SAMPLE DIABETES MEAL PLAN Below is a sample meal plan that  includes foods from the grain and starches, dairy, vegetable, fruit, and meat groups. A dietician can individualize a meal plan to fit your calorie needs and tell you the number of servings needed from each food group. However, controlling the total amount of carbohydrates in your meal or snack is more important than making sure you include all of the food groups at every meal. You may interchange carbohydrate containing foods (dairy, starches, and fruits). The meal plan below is an example of a 2000 calorie diet using carbohydrate counting. This meal plan has 17 carbohydrate servings. Breakfast  1 cup oatmeal (2 carb servings).   cup light yogurt (1 carb serving).  1 cup blueberries (1 carb serving).   cup almonds. Snack  1 large apple (2 carb servings).  1 low-fat string cheese stick. Lunch  Chicken breast salad.  1 cup spinach.   cup chopped tomatoes.  2 oz chicken breast, sliced.  2 tbs low-fat [place] dressing.  12 whole-wheat crackers (2 carb servings).  12 to 15 grapes (1 carb serving).  1 cup low-fat milk (1 carb serving). Snack  1 cup carrots.   cup hummus (1 carb serving). Dinner  3 oz broiled salmon.  1 cup brown rice (3 carb servings). Snack  1  cups steamed broccoli (1 carb serving) drizzled with 1 tsp olive oil and lemon juice.  1 cup light pudding (2 carb servings). DIABETES MEAL PLANNING WORKSHEET Your dietician can use this worksheet to help you decide how many servings of foods and what types of foods are right for you.  BREAKFAST Food Group and Servings / Carb Servings Grain/Starches __________________________________ Dairy __________________________________________ Vegetable ______________________________________ Fruit ___________________________________________ Meat __________________________________________ Fat ____________________________________________ LUNCH Food Group and Servings / Carb Servings Grain/Starches  ___________________________________ Dairy ___________________________________________ Fruit ____________________________________________ Meat ___________________________________________ Fat _____________________________________________ W[employer] Food Group and Servings / Carb Servings Grain/Starches ___________________________________ Dairy ___________________________________________ Fruit ____________________________________________ Meat ___________________________________________ Fat _____________________________________________ SNACKS Food Group and Servings / Carb Servings Grain/Starches ___________________________________ Dairy ___________________________________________ Vegetable _______________________________________ Fruit ____________________________________________ Meat ___________________________________________ Fat _____________________________________________ DAILY TOTALS Starches _________________________ Vegetable ________________________ Fruit ____________________________ Dairy ____________________________ Meat ____________________________ Fat ______________________________ Document Released: 02/03/2005 Document Revised: 08/01/2011 Document Reviewed: 12/15/2008 [employer] Patient Information 2014 [employer].  Diabetes and Exercise Exercising regularly is important. It is not just about losing weight. It has many health benefits, such as:  Improving your overall fitness, flexibility, and endurance.  Increasing your bone density.  Helping with weight control.  Decreasing your body fat.  Increasing your muscle strength.  Reducing stress and tension.  Improving your overall health. People with diabetes who exercise gain additional benefits because exercise:  Reduces appetite.  Improves the body's use of blood sugar (glucose).  Helps lower or control blood glucose.  Decreases blood pressure.  Helps control blood lipids (such as cholesterol and  triglycerides).  Improves the body's use of the hormone insulin by:  Increasing the  body's insulin sensitivity.  Reducing the body's insulin needs.  Decreases the risk for heart disease because exercising:  Lowers cholesterol and triglycerides levels.  Increases the levels of good cholesterol (such as high-density lipoproteins [HDL]) in the body.  Lowers blood glucose levels. YOUR ACTIVITY PLAN  Choose an activity that you enjoy and set realistic goals. Your health care provider or diabetes educator can help you make an activity plan that works for you. You can break activities into 2 or 3 sessions throughout the day. Doing so is as good as one long session. Exercise ideas include:  Taking the dog for a walk.  Taking the stairs instead of the elevator.  Dancing to your favorite song.  Doing your favorite exercise with a friend. RECOMMENDATIONS FOR EXERCISING WITH TYPE 1 OR TYPE 2 DIABETES   Check your blood glucose before exercising. If blood glucose levels are greater than 240 mg/dL, check for urine ketones. Do not exercise if ketones are present.  Avoid injecting insulin into areas of the body that are going to be exercised. For example, avoid injecting insulin into:  The arms when playing tennis.  The legs when jogging.  Keep a record of:  Food intake before and after you exercise.  Expected peak times of insulin action.  Blood glucose levels before and after you exercise.  The type and amount of exercise you have done.  Review your records with your health care provider. Your health care provider will help you to develop guidelines for adjusting food intake and insulin amounts before and after exercising.  If you take insulin or oral hypoglycemic agents, watch for signs and symptoms of hypoglycemia. They include:  Dizziness.  Shaking.  Sweating.  Chills.  Confusion.  Drink plenty of water while you exercise to prevent dehydration or heat stroke. Body  water is lost during exercise and must be replaced.  Talk to your health care provider before starting an exercise program to make sure it is safe for you. Remember, almost any type of activity is better than none. Document Released: 07/30/2003 Document Revised: 01/09/2013 Document Reviewed: 10/16/2012 [facility] Patient Information 2014 [place].

## 2013-07-31 NOTE — Progress Notes (Signed)
Case discussed with Dr. Kollar soon after the resident saw the patient.  We reviewed the resident's history and exam and pertinent patient test results.  I agree with the assessment, diagnosis, and plan of care documented in the resident's note. 

## 2013-07-31 NOTE — Assessment & Plan Note (Signed)
Patient is working to exercise some and work on her diet. She is frustrated by lack of weight loss with these efforts and encouraged her to continue trying. She is going to try to get the lantus through the health department. She is taking some of her mother's novolog after some meals. She was able to bring her sugars down and this morning prior to visit sugar is 180s.

## 2013-07-31 NOTE — Progress Notes (Signed)
Subjective:     Patient ID: [name] [name], female   DOB: 1976-02-27, 38 y.o.   MRN: [id number]  HPI The patient is a 38 YO female who has an unfortunate social situation. She is unable to visualize herself getting her medications and is taking the sample lantus that was given to her at visit one week ago. She has also been using some of her mom's novolog after meals (10 units) and has not had any problems with low sugars. She has PMH of diabetes, HTN, hyperlipidemia. She is given a letter from our social worker explaining how she can get lantus from the health department for free and she thinks she can try that. She is not working at this time and is unable to afford her medicines. She is applying for disabliity (which was previously declined) and she now has a Chief Executive Officer and hopes to get a court date for appeal. No needs at today's visit and she is doing okay.   Review of Systems  Constitutional: Positive for activity change. Negative for fever, chills, diaphoresis, appetite change, fatigue and unexpected weight change.       Trying to walk more  Respiratory: Negative for cough, chest tightness, shortness of breath and wheezing.   Cardiovascular: Negative for chest pain, palpitations and leg swelling.  Gastrointestinal: Negative for nausea, vomiting, abdominal pain, diarrhea and constipation.  Endocrine: Negative for polydipsia and polyuria.       Objective:   Physical Exam  Constitutional: She is oriented to person, place, and time. She appears well-developed and well-nourished.  Obese  HENT:  Head: Normocephalic and atraumatic.  Cardiovascular: Normal rate and regular rhythm.   Pulmonary/Chest: Effort normal and breath sounds normal. No respiratory distress. She has no wheezes. She has no rales.  Abdominal: Soft. Bowel sounds are normal. She exhibits no distension. There is no tenderness. There is no rebound.  Neurological: She is alert and oriented to person, place, and time. No  cranial nerve deficit.       Assessment/Plan:   1. Please see problem oriented charting.  2. Disposition - Patient will be seen back in 6-8 weeks. Encouraged her to get the lantus from the health department. Encouraged her to continue with diet and exercise modifications. She does not wish to see the diabetic educator at this time.

## 2013-08-07 NOTE — Addendum Note (Signed)
Addended by: Truddie Crumble on: 08/07/2013 12:26 PM   Modules accepted: Orders

## 2013-08-13 ENCOUNTER — Other Ambulatory Visit: Payer: Self-pay | Admitting: *Deleted

## 2013-08-13 MED ORDER — INSULIN ASPART 100 UNIT/ML ~~LOC~~ SOLN: 5.0000 [IU] | Freq: Three times a day (TID) | SUBCUTANEOUS | Status: DC

## 2013-08-13 NOTE — Telephone Encounter (Signed)
Rx called in 

## 2013-08-14 ENCOUNTER — Other Ambulatory Visit: Payer: Self-pay | Admitting: *Deleted

## 2013-08-14 DIAGNOSIS — E113292 Type 2 diabetes mellitus with mild nonproliferative diabetic retinopathy without macular edema, left eye: Secondary | ICD-10-CM

## 2013-08-14 DIAGNOSIS — E113391 Type 2 diabetes mellitus with moderate nonproliferative diabetic retinopathy without macular edema, right eye: Secondary | ICD-10-CM

## 2013-08-21 ENCOUNTER — Other Ambulatory Visit: Payer: Self-pay | Admitting: Internal Medicine

## 2013-08-21 DIAGNOSIS — E039 Hypothyroidism, unspecified: Secondary | ICD-10-CM

## 2013-08-21 DIAGNOSIS — F329 Major depressive disorder, single episode, unspecified: Secondary | ICD-10-CM

## 2013-08-21 MED ORDER — LEVOTHYROXINE SODIUM 25 MCG PO TABS: 25.0000 ug | ORAL_TABLET | Freq: Every day | ORAL | Status: DC

## 2013-08-21 MED ORDER — FLUOXETINE HCL 20 MG PO TABS
20.0000 mg | ORAL_TABLET | Freq: Every day | ORAL | Status: DC
Start: 2013-08-21 — End: 2014-02-05

## 2013-08-21 MED ORDER — ROSUVASTATIN CALCIUM 10 MG PO TABS: 10.0000 mg | ORAL_TABLET | Freq: Every day | ORAL | Status: DC

## 2013-08-21 MED ORDER — OLMESARTAN 10 MG HALF TABLET: 10.0000 mg | ORAL_TABLET | Freq: Every day | ORAL | Status: DC

## 2013-08-22 ENCOUNTER — Other Ambulatory Visit: Payer: Self-pay | Admitting: Internal Medicine

## 2013-08-22 MED ORDER — OLMESARTAN MEDOXOMIL 20 MG PO TABS: 20.0000 mg | ORAL_TABLET | Freq: Every day | ORAL | Status: DC

## 2013-09-03 ENCOUNTER — Encounter: Payer: No Typology Code available for payment source | Admitting: Internal Medicine

## 2013-09-11 ENCOUNTER — Encounter: Payer: Self-pay | Admitting: Licensed Clinical Social Worker

## 2013-09-11 ENCOUNTER — Encounter: Payer: Self-pay | Admitting: Internal Medicine

## 2013-09-11 ENCOUNTER — Ambulatory Visit (INDEPENDENT_AMBULATORY_CARE_PROVIDER_SITE_OTHER): Payer: No Typology Code available for payment source | Admitting: Internal Medicine

## 2013-09-11 VITALS — BP 141/93 | HR 90 | Temp 97.6°F | Ht 70.0 in | Wt 329.8 lb

## 2013-09-11 DIAGNOSIS — E1142 Type 2 diabetes mellitus with diabetic polyneuropathy: Secondary | ICD-10-CM

## 2013-09-11 DIAGNOSIS — IMO0002 Reserved for concepts with insufficient information to code with codable children: Secondary | ICD-10-CM

## 2013-09-11 DIAGNOSIS — R8789 Other abnormal findings in specimens from female genital organs: Secondary | ICD-10-CM

## 2013-09-11 DIAGNOSIS — E1165 Type 2 diabetes mellitus with hyperglycemia: Secondary | ICD-10-CM

## 2013-09-11 DIAGNOSIS — E1149 Type 2 diabetes mellitus with other diabetic neurological complication: Secondary | ICD-10-CM

## 2013-09-11 DIAGNOSIS — I10 Essential (primary) hypertension: Secondary | ICD-10-CM

## 2013-09-11 LAB — GLUCOSE, CAPILLARY: Glucose-Capillary: 138 mg/dL — ABNORMAL HIGH (ref 70–99)

## 2013-09-11 MED ORDER — GABAPENTIN 300 MG PO CAPS: 300.0000 mg | ORAL_CAPSULE | Freq: Three times a day (TID) | ORAL | Status: DC

## 2013-09-11 MED ORDER — GABAPENTIN 300 MG PO CAPS: ORAL_CAPSULE | ORAL | Status: DC

## 2013-09-11 MED ORDER — HYDROCHLOROTHIAZIDE 25 MG PO TABS: 25.0000 mg | ORAL_TABLET | Freq: Every day | ORAL | Status: DC

## 2013-09-11 MED ORDER — METFORMIN HCL 1000 MG PO TABS: 1000.0000 mg | ORAL_TABLET | Freq: Two times a day (BID) | ORAL | Status: DC

## 2013-09-11 MED ORDER — METFORMIN HCL 500 MG PO TABS: 500.0000 mg | ORAL_TABLET | Freq: Two times a day (BID) | ORAL | Status: DC

## 2013-09-11 NOTE — Assessment & Plan Note (Signed)
She had LSIL in 2009 w/o f/u  Referred to OB/GYN today for further w/u

## 2013-09-11 NOTE — Patient Instructions (Addendum)
General Instructions: Follow up 10/2013 for your diabetes and HTN Please pick up your medications from the pharmacy  We will have your follow up with OB/GYN Take Neurontin 300 mg for am and lunch and 600 mg at night  Please take Metformin 1000 mg bid    Treatment Goals:  Goals (1 Years of Data) as of 09/11/13         As of Today 07/30/13 07/23/13 06/19/13 05/15/13     Blood Pressure    . Blood Pressure < 130/80  160/92 147/96 146/95 153/97 118/75     Result Component    . HEMOGLOBIN A1C < 7.0    14.0      . LDL CALC < 70           Weight    . Weight < 200 lb (90.719 kg)  329 lb 12.8 oz (149.596 kg) 313 lb 14.4 oz (142.384 kg) 306 lb 14.4 oz (139.209 kg) 304 lb 1.6 oz (137.939 kg) 307 lb 3.2 oz (139.345 kg)      Progress Toward Treatment Goals:  Treatment Goal 09/11/2013  Hemoglobin A1C deteriorated  Blood pressure deteriorated  Stop smoking smoking less    Self Care Goals & Plans:  Self Care Goal 09/11/2013  Manage my medications take my medicines as prescribed; bring my medications to every visit; refill my medications on time  Monitor my health keep track of my blood glucose; bring my glucose meter and log to each visit; keep track of my blood pressure; bring my blood pressure log to each visit; keep track of my weight; check my feet daily  Eat healthy foods drink diet soda or water instead of juice or soda; eat more vegetables; eat foods that are low in salt; eat baked foods instead of fried foods; eat fruit for snacks and desserts; eat smaller portions  Be physically active find an activity I enjoy  Stop smoking call [facility] (1-800-QUIT-NOW)  Meeting treatment goals maintain the current self-care plan    Home Blood Glucose Monitoring 09/11/2013  Check my blood sugar 4 times a day  When to check my blood sugar before meals     Care Management & Community Referrals:  Referral 09/11/2013  Referrals made to community resources none       HPV Test The HPV (human  papillomavirus) test is used to screen for high-risk types with HPV infection. HPV is a group of about 100 related viruses, of which 40 types are genital viruses. Most HPV viruses cause infections that usually resolve without treatment within 2 years. Some HPV infections can cause skin and genital warts (condylomata). HPV types 16, 18, 31 and 45 are considered high-risk types of HPV. High-risk types of HPV do not usually cause visible warts, but if untreated, may lead to cancers of the outlet of the womb (cervix) or anus. An HPV test identifies the DNA (genetic) strands of the HPV infection. Because the test identifies the DNA strands, the test is also referred to as the HPV DNA test. Although HPV is found in both males and females, the HPV test is only used to screen for cervical cancer in females. This test is recommended for females:  With an abnormal Pap test.  After treatment of an abnormal Pap test.  Aged 2 and older.  After treatment of a high-risk HPV infection. The HPV test may be done at the same time as a Pap test in females over the age of 73. Both the HPV and Pap test  require a sample of cells from the cervix. PREPARATION FOR TEST  You may be asked to avoid douching, tampons, or vaginal medicines for 48 hours before the HPV test. You will be asked to urinate before the test. For the HPV test, you will need to lie on an exam table with your feet in stirrups. A spatula will be inserted into the vagina. The spatula will be used to swab the cervix for a cell and mucus sample. The sample will be further evaluated in a lab under a microscope. NORMAL FINDINGS  Normal: High-risk HPV is not found.  Ranges for normal findings may vary among different laboratories and hospitals. You should always check with your doctor after having lab work or other tests done to discuss the meaning of your test results and whether your values are considered within normal limits. MEANING OF TEST An abnormal HPV  test means that high-risk HPV is found. Your caregiver may recommend further testing. Your caregiver will go over the test results with you. He or she will and discuss the importance and meaning of your results, as well as treatment options and the need for additional tests, if necessary. OBTAINING THE RESULTS  It is your responsibility to obtain your test results. Ask the lab or department performing the test when and how you will get your results. Document Released: 06/03/2004 Document Revised: 08/01/2011 Document Reviewed: 02/16/2005 [facility] Patient Information 2014 [place].  Edema Edema is an abnormal build-up of fluids in tissues. Because this is partly dependent on gravity (water flows to the lowest place), it is more common in the legs and thighs (lower extremities). It is also common in the looser tissues, like around the eyes. Painless swelling of the feet and ankles is common and increases as a person ages. It may affect both legs and may include the calves or even thighs. When squeezed, the fluid may move out of the affected area and may leave a dent for a few moments. CAUSES   Prolonged standing or sitting in one place for extended periods of time. Movement helps pump tissue fluid into the veins, and absence of movement prevents this, resulting in edema.  Varicose veins. The valves in the veins do not work as well as they should. This causes fluid to leak into the tissues.  Fluid and salt overload.  Injury, burn, or surgery to the leg, ankle, or foot, may damage veins and allow fluid to leak out.  Sunburn damages vessels. Leaky vessels allow fluid to go out into the sunburned tissues.  Allergies (from insect bites or stings, medications or chemicals) cause swelling by allowing vessels to become leaky.  Protein in the blood helps keep fluid in your vessels. Low protein, as in malnutrition, allows fluid to leak out.  Hormonal changes, including pregnancy and menstruation,  cause fluid retention. This fluid may leak out of vessels and cause edema.  Medications that cause fluid retention. Examples are sex hormones, blood pressure medications, steroid treatment, or anti-depressants.  Some illnesses cause edema, especially heart failure, kidney disease, or liver disease.  Surgery that cuts veins or lymph nodes, such as surgery done for the heart or for breast cancer, may result in edema. DIAGNOSIS  Your caregiver is usually easily able to determine what is causing your swelling (edema) by simply asking what is wrong (getting a history) and examining you (doing a physical). Sometimes x-rays, EKG (electrocardiogram or heart tracing), and blood work may be done to evaluate for underlying medical illness. TREATMENT  General treatment includes:  Leg elevation (or elevation of the affected body part).  Restriction of fluid intake.  Prevention of fluid overload.  Compression of the affected body part. Compression with elastic bandages or support stockings squeezes the tissues, preventing fluid from entering and forcing it back into the blood vessels.  Diuretics (also called water pills or fluid pills) pull fluid out of your body in the form of increased urination. These are effective in reducing the swelling, but can have side effects and must be used only under your caregiver's supervision. Diuretics are appropriate only for some types of edema. The specific treatment can be directed at any underlying causes discovered. Heart, liver, or kidney disease should be treated appropriately. HOME CARE INSTRUCTIONS   Elevate the legs (or affected body part) above the level of the heart, while lying down.  Avoid sitting or standing still for prolonged periods of time.  Avoid putting anything directly under the knees when lying down, and do not wear constricting clothing or garters on the upper legs.  Exercising the legs causes the fluid to work back into the veins and  lymphatic channels. This may help the swelling go down.  The pressure applied by elastic bandages or support stockings can help reduce ankle swelling.  A low-salt diet may help reduce fluid retention and decrease the ankle swelling.  Take any medications exactly as prescribed. SEEK MEDICAL CARE IF:  Your edema is not responding to recommended treatments. SEEK IMMEDIATE MEDICAL CARE IF:   You develop shortness of breath or chest pain.  You cannot breathe when you lay down; or if, while lying down, you have to get up and go to the window to get your breath.  You are having increasing swelling without relief from treatment.  You develop a fever over 102 F (38.9 C).  You develop pain or redness in the areas that are swollen.  Tell your caregiver right away if you have gained 03 lb/1.4 kg in 1 day or 05 lb/2.3 kg in a week. MAKE SURE YOU:   Understand these instructions.  Will watch your condition.  Will get help right away if you are not doing well or get worse. Document Released: 05/09/2005 Document Revised: 11/08/2011 Document Reviewed: 12/26/2007 [facility] Patient Information 2014 [place].  DASH Diet The DASH diet stands for "Dietary Approaches to Stop Hypertension." It is a healthy eating plan that has been shown to reduce high blood pressure (hypertension) in as little as 14 days, while also possibly providing other significant health benefits. These other health benefits include reducing the risk of breast cancer after menopause and reducing the risk of type 2 diabetes, heart disease, colon cancer, and stroke. Health benefits also include weight loss and slowing kidney failure in patients with chronic kidney disease.  DIET GUIDELINES  Limit salt (sodium). Your diet should contain less than 1500 mg of sodium daily.  Limit refined or processed carbohydrates. Your diet should include mostly whole grains. Desserts and added sugars should be used sparingly.  Include  small amounts of heart-healthy fats. These types of fats include nuts, oils, and tub margarine. Limit saturated and trans fats. These fats have been shown to be harmful in the body. CHOOSING FOODS  The following food groups are based on a 2000 calorie diet. See your Registered Dietitian for individual calorie needs. Grains and Grain Products (6 to 8 servings daily)  Eat More Often: Whole-wheat bread, brown rice, whole-grain or wheat pasta, quinoa, popcorn without added fat or salt (  air popped).  Eat Less Often: White bread, white pasta, white rice, cornbread. Vegetables (4 to 5 servings daily)  Eat More Often: Fresh, frozen, and canned vegetables. Vegetables may be raw, steamed, roasted, or grilled with a minimal amount of fat.  Eat Less Often/Avoid: Creamed or fried vegetables. Vegetables in a cheese sauce. Fruit (4 to 5 servings daily)  Eat More Often: All fresh, canned (in natural juice), or frozen fruits. Dried fruits without added sugar. One hundred percent fruit juice ( cup [237 mL] daily).  Eat Less Often: Dried fruits with added sugar. Canned fruit in light or heavy syrup. [employer], Fish, and Poultry (2 servings or less daily. One serving is 3 to 4 oz [85-114 g]).  Eat More Often: Ninety percent or leaner ground beef, tenderloin, sirloin. Round cuts of beef, chicken breast, [place] breast. All fish. Grill, bake, or broil your meat. Nothing should be fried.  Eat Less Often/Avoid: Fatty cuts of meat, [place], or chicken leg, thigh, or wing. Fried cuts of meat or fish. Dairy (2 to 3 servings)  Eat More Often: Low-fat or fat-free milk, low-fat plain or light yogurt, reduced-fat or part-skim cheese.  Eat Less Often/Avoid: Milk (whole, 2%).Whole milk yogurt. Full-fat cheeses. Nuts, Seeds, and Legumes (4 to 5 servings per week)  Eat More Often: All without added salt.  Eat Less Often/Avoid: Salted nuts and seeds, canned beans with added salt. Fats and Sweets (limited)  Eat More  Often: Vegetable oils, tub margarines without trans fats, sugar-free gelatin. Mayonnaise and salad dressings.  Eat Less Often/Avoid: Coconut oils, palm oils, butter, stick margarine, cream, half and half, cookies, candy, pie. FOR MORE INFORMATION The Dash Diet Eating Plan: www.dashdiet.org Document Released: 04/28/2011 Document Revised: 08/01/2011 Document Reviewed: 04/28/2011 [facility] Patient Information 2014 [place], [place].  Hypertension As your heart beats, it forces blood through your arteries. This force is your blood pressure. If the pressure is too high, it is called hypertension (HTN) or high blood pressure. HTN is dangerous because you may have it and not know it. High blood pressure may mean that your heart has to work harder to pump blood. Your arteries may be narrow or stiff. The extra work puts you at risk for heart disease, stroke, and other problems.  Blood pressure consists of two numbers, a higher number over a lower, 110/72, for example. It is stated as "110 over 72." The ideal is below 120 for the top number (systolic) and under 80 for the bottom (diastolic). Write down your blood pressure today. You should pay close attention to your blood pressure if you have certain conditions such as:  Heart failure.  Prior heart attack.  Diabetes  Chronic kidney disease.  Prior stroke.  Multiple risk factors for heart disease. To see if you have HTN, your blood pressure should be measured while you are seated with your arm held at the level of the heart. It should be measured at least twice. A one-time elevated blood pressure reading (especially in the Emergency Department) does not mean that you need treatment. There may be conditions in which the blood pressure is different between your right and left arms. It is important to see your caregiver soon for a recheck. Most people have essential hypertension which means that there is not a specific cause. This type of high blood  pressure may be lowered by changing lifestyle factors such as:  Stress.  Smoking.  Lack of exercise.  Excessive weight.  Drug/tobacco/alcohol use.  Eating less salt. Most people  do not have symptoms from high blood pressure until it has caused damage to the body. Effective treatment can often prevent, delay or reduce that damage. TREATMENT  When a cause has been identified, treatment for high blood pressure is directed at the cause. There are a large number of medications to treat HTN. These fall into several categories, and your caregiver will help you select the medicines that are best for you. Medications may have side effects. You should review side effects with your caregiver. If your blood pressure stays high after you have made lifestyle changes or started on medicines,   Your medication(s) may need to be changed.  Other problems may need to be addressed.  Be certain you understand your prescriptions, and know how and when to take your medicine.  Be sure to follow up with your caregiver within the time frame advised (usually within two weeks) to have your blood pressure rechecked and to review your medications.  If you are taking more than one medicine to lower your blood pressure, make sure you know how and at what times they should be taken. Taking two medicines at the same time can result in blood pressure that is too low. SEEK IMMEDIATE MEDICAL CARE IF:  You develop a severe headache, blurred or changing vision, or confusion.  You have unusual weakness or numbness, or a faint feeling.  You have severe chest or abdominal pain, vomiting, or breathing problems. MAKE SURE YOU:   Understand these instructions.  Will watch your condition.  Will get help right away if you are not doing well or get worse. Document Released: 05/09/2005 Document Revised: 08/01/2011 Document Reviewed: 12/28/2007 [facility] Patient Information 2014 [place].  Exercise to Lose  Weight Exercise and a healthy diet may help you lose weight. Your doctor may suggest specific exercises. EXERCISE IDEAS AND TIPS  Choose low-cost things you enjoy doing, such as walking, bicycling, or exercising to workout videos.  Take stairs instead of the elevator.  Walk during your lunch break.  Park your car further away from work or school.  Go to a gym or an exercise class.  Start with 5 to 10 minutes of exercise each day. Build up to 30 minutes of exercise 4 to 6 days a week.  Wear shoes with good support and comfortable clothes.  Stretch before and after working out.  Work out until you breathe harder and your heart beats faster.  Drink extra water when you exercise.  Do not do so much that you hurt yourself, feel dizzy, or get very short of breath. Exercises that burn about 150 calories:  Running 1  miles in 15 minutes.  Playing volleyball for 45 to 60 minutes.  Washing and waxing a car for 45 to 60 minutes.  Playing touch football for 45 minutes.  Walking 1  miles in 35 minutes.  Pushing a stroller 1  miles in 30 minutes.  Playing basketball for 30 minutes.  Raking leaves for 30 minutes.  Bicycling 5 miles in 30 minutes.  Walking 2 miles in 30 minutes.  Dancing for 30 minutes.  Shoveling snow for 15 minutes.  Swimming laps for 20 minutes.  Walking up stairs for 15 minutes.  Bicycling 4 miles in 15 minutes.  Gardening for 30 to 45 minutes.  Jumping rope for 15 minutes.  Washing windows or floors for 45 to 60 minutes. Document Released: 06/11/2010 Document Revised: 08/01/2011 Document Reviewed: 06/11/2010 [facility] Patient Information 2014 [place], [place]. Smoking Cessation Quitting smoking is important  to your health and has many advantages. However, it is not always easy to quit since nicotine is a very addictive drug. Often times, people try 3 times or more before being able to quit. This document explains the best ways for you to prepare  to quit smoking. Quitting takes hard work and a lot of effort, but you can do it. ADVANTAGES OF QUITTING SMOKING  You will live longer, feel better, and live better.  Your body will feel the impact of quitting smoking almost immediately.  Within 20 minutes, blood pressure decreases. Your pulse returns to its normal level.  After 8 hours, carbon monoxide levels in the blood return to normal. Your oxygen level increases.  After 24 hours, the chance of having a heart attack starts to decrease. Your breath, hair, and body stop smelling like smoke.  After 48 hours, damaged nerve endings begin to recover. Your sense of taste and smell improve.  After 72 hours, the body is virtually free of nicotine. Your bronchial tubes relax and breathing becomes easier.  After 2 to 12 weeks, lungs can hold more air. Exercise becomes easier and circulation improves.  The risk of having a heart attack, stroke, cancer, or lung disease is greatly reduced.  After 1 year, the risk of coronary heart disease is cut in half.  After 5 years, the risk of stroke falls to the same as a nonsmoker.  After 10 years, the risk of lung cancer is cut in half and the risk of other cancers decreases significantly.  After 15 years, the risk of coronary heart disease drops, usually to the level of a nonsmoker.  If you are pregnant, quitting smoking will improve your chances of having a healthy baby.  The people you live with, especially any children, will be healthier.  You will have extra money to spend on things other than cigarettes. QUESTIONS TO THINK ABOUT BEFORE ATTEMPTING TO QUIT You may want to talk about your answers with your caregiver.  Why do you want to quit?  If you tried to quit in the past, what helped and what did not?  What will be the most difficult situations for you after you quit? How will you plan to handle them?  Who can help you through the tough times? Your family? Friends? A  caregiver?  What pleasures do you get from smoking? What ways can you still get pleasure if you quit? Here are some questions to ask your caregiver:  How can you help me to be successful at quitting?  What medicine do you think would be best for me and how should I take it?  What should I do if I need more help?  What is smoking withdrawal like? How can I get information on withdrawal? GET READY  Set a quit date.  Change your environment by getting rid of all cigarettes, ashtrays, matches, and lighters in your home, car, or work. Do not let people smoke in your home.  Review your past attempts to quit. Think about what worked and what did not. GET SUPPORT AND ENCOURAGEMENT You have a better chance of being successful if you have help. You can get support in many ways.  Tell your family, friends, and co-workers that you are going to quit and need their support. Ask them not to smoke around you.  Get individual, group, or telephone counseling and support. Programs are available at [employer] and health centers. Call your local health department for information about programs in your  area.  Spiritual beliefs and practices may help some smokers quit.  Download a "quit meter" on your computer to keep track of quit statistics, such as how long you have gone without smoking, cigarettes not smoked, and money saved.  Get a self-help book about quitting smoking and staying off of tobacco. [place]  yourself from urges to smoke. Talk to someone, go for a walk, or occupy your time with a task.  Change your normal routine. Take a different route to work. Drink tea instead of coffee. Eat breakfast in a different place.  Reduce your stress. Take a hot bath, exercise, or read a book.  Plan something enjoyable to do every day. Reward yourself for not smoking.  Explore interactive web-based programs that specialize in helping you quit. GET MEDICINE AND USE  IT CORRECTLY Medicines can help you stop smoking and decrease the urge to smoke. Combining medicine with the above behavioral methods and support can greatly increase your chances of successfully quitting smoking.  Nicotine replacement therapy helps deliver nicotine to your body without the negative effects and risks of smoking. Nicotine replacement therapy includes nicotine gum, lozenges, inhalers, nasal sprays, and skin patches. Some may be available over-the-counter and others require a prescription.  Antidepressant medicine helps people abstain from smoking, but how this works is unknown. This medicine is available by prescription.  Nicotinic receptor partial agonist medicine simulates the effect of nicotine in your brain. This medicine is available by prescription. Ask your caregiver for advice about which medicines to use and how to use them based on your health history. Your caregiver will tell you what side effects to look out for if you choose to be on a medicine or therapy. Carefully read the information on the package. Do not use any other product containing nicotine while using a nicotine replacement product.  RELAPSE OR DIFFICULT SITUATIONS Most relapses occur within the first 3 months after quitting. Do not be discouraged if you start smoking again. Remember, most people try several times before finally quitting. You may have symptoms of withdrawal because your body is used to nicotine. You may crave cigarettes, be irritable, feel very hungry, cough often, get headaches, or have difficulty concentrating. The withdrawal symptoms are only temporary. They are strongest when you first quit, but they will go away within 10 14 days. To reduce the chances of relapse, try to:  Avoid drinking alcohol. Drinking lowers your chances of successfully quitting.  Reduce the amount of caffeine you consume. Once you quit smoking, the amount of caffeine in your body increases and can give you symptoms,  such as a rapid heartbeat, sweating, and anxiety.  Avoid smokers because they can make you want to smoke.  Do not let weight gain distract you. Many smokers will gain weight when they quit, usually less than 10 pounds. Eat a healthy diet and stay active. You can always lose the weight gained after you quit.  Find ways to improve your mood other than smoking. FOR MORE INFORMATION  www.smokefree.gov  Document Released: 05/03/2001 Document Revised: 11/08/2011 Document Reviewed: 08/18/2011 [facility] Patient Information 2014 [place], [place].  Hydrochlorothiazide, HCTZ capsules or tablets What is this medicine? HYDROCHLOROTHIAZIDE (hye droe klor oh THYE a zide) is a diuretic. It increases the amount of urine passed, which causes the body to lose salt and water. This medicine is used to treat high blood pressure. It is also reduces the swelling and water retention caused by various medical conditions, such as heart,  liver, or kidney disease. This medicine may be used for other purposes; ask your health care provider or pharmacist if you have questions. COMMON BRAND NAME(S): Esidrix, Ezide, HydroDIURIL, Microzide, Oretic, Zide What should I tell my health care provider before I take this medicine? They need to know if you have any of these conditions: -diabetes -gout -immune system problems, like lupus -kidney disease or kidney stones -liver disease -pancreatitis -small amount of urine or difficulty passing urine -an unusual or allergic reaction to hydrochlorothiazide, sulfa drugs, other medicines, foods, dyes, or preservatives -pregnant or trying to get pregnant -breast-feeding How should I use this medicine? Take this medicine by mouth with a glass of water. Follow the directions on the prescription label. Take your medicine at regular intervals. Remember that you will need to pass urine frequently after taking this medicine. Do not take your doses at a time of day that will cause you  problems. Do not stop taking your medicine unless your doctor tells you to. Talk to your pediatrician regarding the use of this medicine in children. Special care may be needed. Overdosage: If you think you have taken too much of this medicine contact a poison control center or emergency room at once. NOTE: This medicine is only for you. Do not share this medicine with others. What if I miss a dose? If you miss a dose, take it as soon as you can. If it is almost time for your next dose, take only that dose. Do not take double or extra doses. What may interact with this medicine? -cholestyramine -colestipol -digoxin -dofetilide -lithium -medicines for blood pressure -medicines for diabetes -medicines that relax muscles for surgery -other diuretics -steroid medicines like prednisone or cortisone This list may not describe all possible interactions. Give your health care provider a list of all the medicines, herbs, non-prescription drugs, or dietary supplements you use. Also tell them if you smoke, drink alcohol, or use illegal drugs. Some items may interact with your medicine. What should I watch for while using this medicine? Visit your doctor or health care professional for regular checks on your progress. Check your blood pressure as directed. Ask your doctor or health care professional what your blood pressure should be and when you should contact him or her. You may need to be on a special diet while taking this medicine. Ask your doctor. Check with your doctor or health care professional if you get an attack of severe diarrhea, nausea and vomiting, or if you sweat a lot. The loss of too much body fluid can make it dangerous for you to take this medicine. You may get drowsy or dizzy. Do not drive, use machinery, or do anything that needs mental alertness until you know how this medicine affects you. Do not stand or sit up quickly, especially if you are an older patient. This reduces the risk  of dizzy or fainting spells. Alcohol may interfere with the effect of this medicine. Avoid alcoholic drinks. This medicine may affect your blood sugar level. If you have diabetes, check with your doctor or health care professional before changing the dose of your diabetic medicine. This medicine can make you more sensitive to the sun. Keep out of the sun. If you cannot avoid being in the sun, wear protective clothing and use sunscreen. Do not use sun lamps or tanning beds/booths. What side effects may I notice from receiving this medicine? Side effects that you should report to your doctor or health care professional as soon as  possible: -allergic reactions such as skin rash or itching, hives, swelling of the lips, mouth, tongue, or throat -changes in vision -chest pain -eye pain -fast or irregular heartbeat -feeling faint or lightheaded, falls -gout attack -muscle pain or cramps -pain or difficulty when passing urine -pain, tingling, numbness in the hands or feet -redness, blistering, peeling or loosening of the skin, including inside the mouth -unusually weak or tired Side effects that usually do not require medical attention (report to your doctor or health care professional if they continue or are bothersome): -change in sex drive or performance -dry mouth -headache -stomach upset This list may not describe all possible side effects. Call your doctor for medical advice about side effects. You may report side effects to FDA at 1-800-FDA-1088. Where should I keep my medicine? Keep out of the reach of children. Store at room temperature between 15 and 30 degrees C (59 and 86 degrees F). Do not freeze. Protect from light and moisture. Keep container closed tightly. Throw away any unused medicine after the expiration date. NOTE: This sheet is a summary. It may not cover all possible information. If you have questions about this medicine, talk to your doctor, pharmacist, or health care  provider.  2014, Elsevier/Gold Standard. (2010-01-01 12:57:37)

## 2013-09-11 NOTE — Progress Notes (Signed)
Three Rx called in to [facility] per Dr Aundra Dubin. Hilda Blades Annya Lizana RN 09/11/13 10:50AM

## 2013-09-11 NOTE — Assessment & Plan Note (Signed)
Lab Results  Component Value Date   HGBA1C 14.0 07/23/2013   HGBA1C 12.5 04/16/2013   HGBA1C 15.4* 07/06/2012     Assessment: Diabetes control: poor control (HgbA1C >9%) Progress toward A1C goal:  deteriorated Comments: pt only taking metformin 500 mg bid and not able to get Lantus yet.  She was given Sample of flexpen lantus to take 15 units on 07/23/13  Plan: Medications:  increased Metformin 1000 mg bid.  Pt will be able to pick up Lantus 15 units today from [facility] program Home glucose monitoring: Frequency: 4 times a day (uses a mom's meter ) Timing: before meals Instruction/counseling given: reminded to bring medications to each visit and provided printed educational material Educational resources provided: brochure Self management tools provided: other (see comments) Other plans: f/u 10/2013, she has neuropathy associated with DM increased Neurontin 300 tid to 300 bid and 600 mg qhs, encouraged compliance, social work consulted to help get Lantus

## 2013-09-11 NOTE — Progress Notes (Signed)
INTERNAL MEDICINE TEACHING ATTENDING ADDENDUM - Shalaina Guardiola, MD: I reviewed and discussed at the time of visit with the resident Dr. McLean, the patient's medical history, physical examination, diagnosis and results of tests and treatment and I agree with the patient's care as documented. 

## 2013-09-11 NOTE — Assessment & Plan Note (Signed)
Encouraged compliance with DM meds Increased qhs Neurontin to 600 mg and 600 bid am and lunch

## 2013-09-11 NOTE — Progress Notes (Signed)
   Subjective:    Patient ID: [name] [name], female    DOB: 01/13/1976, 38 y.o.   MRN: [id number]  HPI Comments: 38 y.o Past Medical History Hyperlipidemia, Hypertension (BP 160/92 then 141/93), Obesity, Peripheral neuropathy, LSIL Abnormal Pap smear of cervix 2009 (never followed up) , Endocarditis. Edema of lower extremity, Heart murmur, Chronic bronchitis, OSA on CPAP  Hypothyroidism Type II diabetes mellitus (HA1C 14.0 07/23/13), Anemia of chronic disease, GERD (gastroesophageal reflux disease), Chronic pain, Anxiety/Depression, Bipolar disorder, EF 60-65% 07/2013.   She presents for f/u for:  1)She needs medication refills of Neurontin and Metformin 2) She has swelling of her hands and legs which is chronic 3) She states she has numbness and tingling in her hands and legs worse x 2 weeks.  This sensation causes pain 8/10 daily due to burning and tingling.   4) DM-She has been unable to get Lantus yet and only taking Metformin 500 mg bid and Novolog 5-15 units tid. She uses her mom's meter and checks glucose 4x per day. She did not bring meter today   SH: She is exercising to try to lose weight      Review of Systems  Respiratory: Negative for shortness of breath.   Cardiovascular: Positive for leg swelling. Negative for chest pain.  Gastrointestinal: Negative for constipation.  Psychiatric/Behavioral: Positive for dysphoric mood. Negative for suicidal ideas.       Objective:   Physical Exam  Nursing note and vitals reviewed. Constitutional: She is oriented to person, place, and time. She appears well-developed and well-nourished. She is cooperative. No distress.  HENT:  Head: Normocephalic and atraumatic.  Mouth/Throat: Oropharynx is clear and moist and mucous membranes are normal. Abnormal dentition. No oropharyngeal exudate.  Eyes: Conjunctivae are normal. Pupils are equal, round, and reactive to light. Right eye exhibits no discharge. Left eye exhibits no discharge. No  scleral icterus.  Cardiovascular: Normal rate, regular rhythm, S1 normal, S2 normal and normal heart sounds.   No murmur heard. 2+ lower ext edema b/l   Pulmonary/Chest: Effort normal and breath sounds normal. No respiratory distress. She has no wheezes.  Abdominal: Soft. Bowel sounds are normal. There is no tenderness.  Obese   Neurological: She is alert and oriented to person, place, and time. Gait normal.  Skin: Skin is warm and dry. No rash noted. She is not diaphoretic.  Hyperpigmented lesions to arms b/l  Psychiatric: Her speech is normal and behavior is normal. Judgment and thought content normal. Cognition and memory are normal. She exhibits a depressed mood.          Assessment & Plan:  F/u in 10/2013

## 2013-09-11 NOTE — Assessment & Plan Note (Signed)
BP Readings from Last 3 Encounters:  09/11/13 141/93  07/30/13 147/96  07/23/13 146/95    Lab Results  Component Value Date   NA 137 07/23/2013   K 4.4 07/23/2013   CREATININE 0.89 07/23/2013    Assessment: Blood pressure control: moderately elevated Progress toward BP goal:  deteriorated Comments: none  Plan: Medications:  due to 2+ edema lower ext will add HCTZ 25 mg to Benicar 20 mg since BP not controlled  Educational resources provided: Public relations account executive tools provided: other (see comments) Other plans: check BMET at f/u

## 2013-09-12 ENCOUNTER — Other Ambulatory Visit: Payer: Self-pay | Admitting: Dietician

## 2013-09-12 DIAGNOSIS — IMO0002 Reserved for concepts with insufficient information to code with codable children: Secondary | ICD-10-CM

## 2013-09-12 DIAGNOSIS — E1165 Type 2 diabetes mellitus with hyperglycemia: Secondary | ICD-10-CM

## 2013-09-12 MED ORDER — GLUCOSE BLOOD VI STRP: ORAL_STRIP | Status: DC

## 2013-09-12 MED ORDER — BLOOD GLUCOSE METER KIT: PACK | Status: DC

## 2013-09-12 MED ORDER — LANCETS 30G MISC
Status: DC
Start: 2013-09-12 — End: 2015-01-04

## 2013-09-12 NOTE — Progress Notes (Signed)
[name] [name] was referred to CSW for assistance with obtaining Lantus.  [name] [name] is uninsured, has the [place] and has completed her [facility] application.  Pt's RN today contacted [facility] and confirmed, awaiting response from manufacturer.  [facility] can supply samples as available.  Pt has applied for resources available.  CSW will sign off.

## 2013-09-12 NOTE — Telephone Encounter (Signed)
Meter and supplies called into health department.  Strips are dispensed 50 at a time for 6$, meter is free. Patient notified.( she also asked for a duplicate orange card because she cannot find her- will notify our financial counselor to contact her about this.

## 2013-09-20 ENCOUNTER — Encounter: Payer: No Typology Code available for payment source | Admitting: Obstetrics & Gynecology

## 2013-10-01 ENCOUNTER — Other Ambulatory Visit: Payer: Self-pay | Admitting: Internal Medicine

## 2013-10-01 ENCOUNTER — Telehealth: Payer: Self-pay | Admitting: Internal Medicine

## 2013-10-01 DIAGNOSIS — E1165 Type 2 diabetes mellitus with hyperglycemia: Secondary | ICD-10-CM

## 2013-10-01 DIAGNOSIS — IMO0002 Reserved for concepts with insufficient information to code with codable children: Secondary | ICD-10-CM

## 2013-10-01 NOTE — Telephone Encounter (Signed)
I called the patient to inform her of the retinal scan result of macular edema. There was no answer. I have forwarded this message to clinic staff to help get in contact with patient.

## 2013-10-03 NOTE — Telephone Encounter (Addendum)
Pt informed of results and the need for additional follow up with an opthalmologist (pt prefers morning appt). Will forward referral to [facility] referral coordinator. Phone call complete.Darlene C Goldston5/14/20159:32 AM

## 2013-10-24 ENCOUNTER — Encounter: Payer: No Typology Code available for payment source | Admitting: Family Medicine

## 2013-10-24 ENCOUNTER — Telehealth: Payer: Self-pay

## 2013-10-24 NOTE — Telephone Encounter (Signed)
Patient missed today's colpo appointment; second appointment missed. Called patient and left message stating we are sorry you missed your appointment today, please call clinic so that we may reschedule.

## 2013-10-29 NOTE — Addendum Note (Signed)
Addended by: Hulan Fray on: 10/29/2013 07:01 PM   Modules accepted: Orders

## 2014-01-07 ENCOUNTER — Ambulatory Visit: Payer: Self-pay

## 2014-01-22 ENCOUNTER — Ambulatory Visit: Payer: Self-pay

## 2014-01-29 ENCOUNTER — Ambulatory Visit: Payer: No Typology Code available for payment source

## 2014-01-31 ENCOUNTER — Other Ambulatory Visit: Payer: Self-pay | Admitting: *Deleted

## 2014-01-31 DIAGNOSIS — E039 Hypothyroidism, unspecified: Secondary | ICD-10-CM

## 2014-01-31 MED ORDER — LEVOTHYROXINE SODIUM 25 MCG PO TABS: 25.0000 ug | ORAL_TABLET | Freq: Every day | ORAL | Status: DC

## 2014-02-03 ENCOUNTER — Ambulatory Visit (INDEPENDENT_AMBULATORY_CARE_PROVIDER_SITE_OTHER): Payer: No Typology Code available for payment source | Admitting: Internal Medicine

## 2014-02-03 VITALS — BP 155/93 | HR 92 | Wt 324.3 lb

## 2014-02-03 DIAGNOSIS — F329 Major depressive disorder, single episode, unspecified: Secondary | ICD-10-CM

## 2014-02-03 DIAGNOSIS — Z9989 Dependence on other enabling machines and devices: Secondary | ICD-10-CM

## 2014-02-03 DIAGNOSIS — E039 Hypothyroidism, unspecified: Secondary | ICD-10-CM

## 2014-02-03 DIAGNOSIS — IMO0002 Reserved for concepts with insufficient information to code with codable children: Secondary | ICD-10-CM

## 2014-02-03 DIAGNOSIS — R8789 Other abnormal findings in specimens from female genital organs: Secondary | ICD-10-CM

## 2014-02-03 DIAGNOSIS — G4733 Obstructive sleep apnea (adult) (pediatric): Secondary | ICD-10-CM

## 2014-02-03 DIAGNOSIS — I10 Essential (primary) hypertension: Secondary | ICD-10-CM

## 2014-02-03 DIAGNOSIS — E1142 Type 2 diabetes mellitus with diabetic polyneuropathy: Secondary | ICD-10-CM

## 2014-02-03 DIAGNOSIS — F331 Major depressive disorder, recurrent, moderate: Secondary | ICD-10-CM

## 2014-02-03 DIAGNOSIS — E785 Hyperlipidemia, unspecified: Secondary | ICD-10-CM

## 2014-02-03 DIAGNOSIS — Z23 Encounter for immunization: Secondary | ICD-10-CM

## 2014-02-03 DIAGNOSIS — IMO0001 Reserved for inherently not codable concepts without codable children: Secondary | ICD-10-CM

## 2014-02-03 DIAGNOSIS — E1165 Type 2 diabetes mellitus with hyperglycemia: Secondary | ICD-10-CM

## 2014-02-03 DIAGNOSIS — E13311 Other specified diabetes mellitus with unspecified diabetic retinopathy with macular edema: Secondary | ICD-10-CM

## 2014-02-03 LAB — BASIC METABOLIC PANEL
BUN: 17 mg/dL (ref 6–23)
CHLORIDE: 107 meq/L (ref 96–112)
CO2: 23 meq/L (ref 19–32)
Calcium: 9.2 mg/dL (ref 8.4–10.5)
Creat: 0.86 mg/dL (ref 0.50–1.10)
Glucose, Bld: 74 mg/dL (ref 70–99)
Potassium: 4.6 mEq/L (ref 3.5–5.3)
SODIUM: 140 meq/L (ref 135–145)

## 2014-02-03 LAB — LIPID PANEL
CHOL/HDL RATIO: 4.1 ratio
Cholesterol: 165 mg/dL (ref 0–200)
HDL: 40 mg/dL (ref >39)
LDL Cholesterol: 98 mg/dL (ref 0–99)
Triglycerides: 134 mg/dL (ref <150)
VLDL: 27 mg/dL (ref 0–40)

## 2014-02-03 LAB — POCT GLYCOSYLATED HEMOGLOBIN (HGB A1C): Hemoglobin A1C: 8.6

## 2014-02-03 LAB — GLUCOSE, CAPILLARY: Glucose-Capillary: 83 mg/dL (ref 70–99)

## 2014-02-03 LAB — TSH: TSH: 4.67 u[IU]/mL — AB (ref 0.350–4.500)

## 2014-02-03 NOTE — Assessment & Plan Note (Addendum)
BP Readings from Last 3 Encounters:  02/03/14 155/93  09/11/13 141/93  07/30/13 147/96    Lab Results  Component Value Date   NA 137 07/23/2013   K 4.4 07/23/2013   CREATININE 0.89 07/23/2013    Assessment: Blood pressure control:  Deteriorated Progress toward BP goal:   Not at goal (Goal <140/90) Comments: Currently on olmesartan 20 mg daily and HCTZ 25 mg QD. Patient has not taken her medications this morning.  Plan: Medications:  continue current medications Educational resources provided:  none Self management tools provided:  none

## 2014-02-03 NOTE — Patient Instructions (Signed)
General Instructions:   Please bring your medicines with you each time you come to clinic.  Medicines may include prescription medications, over-the-counter medications, herbal remedies, eye drops, vitamins, or other pills.  Congratulations on better diabetes control! Your hgbA1C decreased from 14.0 to 8.6. Continue to monitor your blood sugar, eat healthy and exercise. This will not only help your diabetes, but also improve your cholesterol, weight and blood pressure.  We are making you the following referrals today: Ophthalmologist and Ob/Gyn  For your depression, we recommend continuing Fluoxetine 20 mg daily and to follow up with [facility]. They will be able to discuss issues through therapy and put you on an appropriate drug regimen.   Continue all of you other medications as ordered. If any labs come back abnormal, I will notify you of them and let you know if I make any changes to your medications.  I have also put in a prescription for a humidifier for your CPAP.  Depression Depression refers to feeling sad, low, down in the dumps, blue, gloomy, or empty. In general, there are two kinds of depression: 1. Normal sadness or normal grief. This kind of depression is one that we all feel from time to time after upsetting life experiences, such as the loss of a job or the ending of a relationship. This kind of depression is considered normal, is short lived, and resolves within a few days to 2 weeks. Depression experienced after the loss of a loved one (bereavement) often lasts longer than 2 weeks but normally gets better with time. 2. Clinical depression. This kind of depression lasts longer than normal sadness or normal grief or interferes with your ability to function at home, at work, and in school. It also interferes with your personal relationships. It affects almost every aspect of your life. Clinical depression is an illness. Symptoms of depression can also be caused  by conditions other than those mentioned above, such as:  Physical illness. Some physical illnesses, including underactive thyroid gland (hypothyroidism), severe anemia, specific types of cancer, diabetes, uncontrolled seizures, heart and lung problems, strokes, and chronic pain are commonly associated with symptoms of depression.  Side effects of some prescription medicine. In some people, certain types of medicine can cause symptoms of depression.  Substance abuse. Abuse of alcohol and illicit drugs can cause symptoms of depression. SYMPTOMS Symptoms of normal sadness and normal grief include the following:  Feeling sad or crying for short periods of time.  Not caring about anything (apathy).  Difficulty sleeping or sleeping too much.  No longer able to enjoy the things you used to enjoy.  Desire to be by oneself all the time (social isolation).  Lack of energy or motivation.  Difficulty concentrating or remembering.  Change in appetite or weight.  Restlessness or agitation. Symptoms of clinical depression include the same symptoms of normal sadness or normal grief and also the following symptoms:  Feeling sad or crying all the time.  Feelings of guilt or worthlessness.  Feelings of hopelessness or helplessness.  Thoughts of suicide or the desire to harm yourself (suicidal ideation).  Loss of touch with reality (psychotic symptoms). Seeing or hearing things that are not real (hallucinations) or having false beliefs about your life or the people around you (delusions and paranoia). DIAGNOSIS  The diagnosis of clinical depression is usually based on how bad the symptoms are and how long they have lasted. Your health care provider will also ask you questions about your medical  history and substance use to find out if physical illness, use of prescription medicine, or substance abuse is causing your depression. Your health care provider may also order blood tests. TREATMENT   Often, normal sadness and normal grief do not require treatment. However, sometimes antidepressant medicine is given for bereavement to ease the depressive symptoms until they resolve. The treatment for clinical depression depends on how bad the symptoms are but often includes antidepressant medicine, counseling with a mental health professional, or both. Your health care provider will help to determine what treatment is best for you. Depression caused by physical illness usually goes away with appropriate medical treatment of the illness. If prescription medicine is causing depression, talk with your health care provider about stopping the medicine, decreasing the dose, or changing to another medicine. Depression caused by the abuse of alcohol or illicit drugs goes away when you stop using these substances. Some adults need professional help in order to stop drinking or using drugs. SEEK IMMEDIATE MEDICAL CARE IF:  You have thoughts about hurting yourself or others.  You lose touch with reality (have psychotic symptoms).  You are taking medicine for depression and have a serious side effect. FOR MORE INFORMATION  National Alliance on Mental Illness: www.nami.[employer]: https://carter.com/ Document Released: 05/06/2000 Document Revised: 09/23/2013 Document Reviewed: 08/08/2011 [facility] Patient Information 2015 [place], [place]. This information is not intended to replace advice given to you by your health care provider. Make sure you discuss any questions you have with your health care provider.

## 2014-02-03 NOTE — Progress Notes (Signed)
   Subjective:    Patient ID: [name] [name], female    DOB: 09-27-1975, 38 y.o.   MRN: [id number]  HPI [name] is a 38 yo female with PMHx of HLD, HTN, Obesity, and Type II DM with neuropathy who presents to the clinic today for routine follow up.  Depression/Anxiety: Patient complains of increased depression and anxiety that limits her from being activity and enjoying her daily life. She admits to feeling unfocused, loss of interest, sadness, and disinterest. She spends most of her day in her room. She admits to past suicidal ideation, but denies any current or recent SI. She has been taking fluoxetine as prescribed. She was previously prescribed Buspirone but was unable to get the medication due to insurance reasons.    Review of Systems General: Admits to fatigue. Denies fever, chills, change in appetite and diaphoresis.  Respiratory: Denies SOB, cough, DOE, chest tightness, and wheezing.   Cardiovascular: Admits to occasional palpitations. Denies chest pain.  Gastrointestinal: Denies nausea, vomiting, abdominal pain, diarrhea, constipation, blood in stool and abdominal distention.  Genitourinary: Denies dysuria, urgency, frequency, hematuria, suprapubic pain and flank pain. Endocrine: Denies hot or cold intolerance, polyuria, and polydipsia. Musculoskeletal: Admits to myalgias, Denies back pain, joint swelling, arthralgias and gait problem.  Skin: Denies pallor, rash and wounds.  Neurological: Admits to numbness and burning in fingers and lower extremities. Denies dizziness, headaches, weakness, lightheadedness,seizures, and syncope, Psychiatric/Behavioral: Admits to mood changes, Denies confusion, nervousness, sleep disturbance and agitation.     Objective:   Physical Exam Filed Vitals:   02/03/14 0958  BP: 155/93  Pulse: 92  Weight: 324 lb 4.8 oz (147.102 kg)  SpO2: 100%   General: Vital signs reviewed.  Patient is well-developed and well-nourished, in no acute distress  and cooperative with exam.   Cardiovascular: RRR, S1 normal, S2 normal, no murmurs, gallops, or rubs. Pulmonary/Chest: Clear to auscultation bilaterally, no wheezes, rales, or rhonchi. Abdominal: Soft, non-tender, non-distended, BS +, no masses, organomegaly, or guarding present.  Musculoskeletal: No joint deformities, erythema, or stiffness, ROM full and nontender. Extremities: No lower extremity edema bilaterally, no ulcers on lesions on feet bilaterally, pulses symmetric and intact bilaterally. No cyanosis or clubbing. Skin: Warm, dry and intact. No rashes or erythema. Psychiatric: Depressed mood and flat, distant affect. speech and behavior is normal. Patient rarely makes eye contact. Cognition and memory are normal.      Assessment & Plan:   Please see problem based assessment and plan.

## 2014-02-03 NOTE — Assessment & Plan Note (Addendum)
Assessment: Last TSH elevated at 5.345 and Free T4 normal at 1.16 on 04/25/13. Patient is currently on Synthroid 25 mcg QAM.  Plan: We will recheck TSH level today and make adjustments to Synthroid as necessary.  Addendum: TSH elevated at 4.670, however improved from prior measurements. Since patient is asymptomatic, we will keep her on her current dose regimen.

## 2014-02-03 NOTE — Assessment & Plan Note (Signed)
Assessment: Uncontrolled Major Depression on fluoxetine 20 mg daily.   Plan: Referred patient to [facility] where she can receive both counseling and therapy and prescription/free samples for antidepressants. Patient is in agreement with the plan.

## 2014-02-03 NOTE — Assessment & Plan Note (Addendum)
Assessment: Last lipid profile showed total cholesterol 186, TG 55, LDL 131 on 04/25/13. Currently on rosuvastatin 10 mg daily.  Plan: We will continue current regimen and recheck lipid profile today.   Addendum: LDL 98. This meets the ADA guidelines of LDL <100. If no major parameters are changed, patient can have lipid profile recheck in 5 years.

## 2014-02-03 NOTE — Assessment & Plan Note (Addendum)
Lab Results  Component Value Date   HGBA1C 8.6 02/03/2014   HGBA1C 14.0 07/23/2013   HGBA1C 12.5 04/16/2013     Assessment: Diabetes control:  Uncontrolled Progress toward A1C goal:   Improved Comments: Last HgbA1C 14.0 on 07/23/13. Currently on Lantus 15 units QHS and Novolog 5 units TID and gabapentin 300 mg QAM and 600 mg QPM for neuropathy. Hemoglobin A1C improved greatly today to 8.6.  Plan: Medications:  continue current medications Home glucose monitoring: Frequency:  3 times a day Timing:  before breakfast and 2 times later in the day Instruction/counseling given: reminded to get eye exam Educational resources provided:   Self management tools provided:   Other plans: Congratulated patient on improvement and encouraged her to continue diet and exercise.

## 2014-02-03 NOTE — Assessment & Plan Note (Signed)
Assessment: Patient has a history of LGSIL treated in 2009. Patient has not had follow up.   Plan: We have made referral for follow up with Ob/Gyn.

## 2014-02-03 NOTE — Telephone Encounter (Signed)
Pt seen in clinic today 9/14 TSH drawn

## 2014-02-05 ENCOUNTER — Other Ambulatory Visit: Payer: Self-pay | Admitting: *Deleted

## 2014-02-05 MED ORDER — FLUOXETINE HCL 20 MG PO TABS: 20.0000 mg | ORAL_TABLET | Freq: Every day | ORAL | Status: DC

## 2014-02-05 NOTE — Progress Notes (Signed)
Internal Medicine Clinic Attending Date of visit: 02/03/2014  I saw and evaluated the patient.  I personally confirmed the key portions of the history and exam documented by Dr. Marvel Plan and I reviewed pertinent patient test results.  The assessment, diagnosis, and plan were formulated together and I agree with the documentation in the resident's note.

## 2014-02-10 ENCOUNTER — Encounter: Payer: Self-pay | Admitting: Family Medicine

## 2014-02-18 ENCOUNTER — Encounter: Payer: Self-pay | Admitting: *Deleted

## 2014-03-21 ENCOUNTER — Other Ambulatory Visit: Payer: Self-pay | Admitting: Oncology

## 2014-03-21 ENCOUNTER — Telehealth: Payer: Self-pay | Admitting: *Deleted

## 2014-03-21 ENCOUNTER — Encounter: Payer: Self-pay | Admitting: Oncology

## 2014-03-21 DIAGNOSIS — E039 Hypothyroidism, unspecified: Secondary | ICD-10-CM

## 2014-03-21 HISTORY — DX: Hypothyroidism, unspecified: E03.9

## 2014-03-21 MED ORDER — LEVOTHYROXINE SODIUM 25 MCG PO TABS: 25.0000 ug | ORAL_TABLET | Freq: Every day | ORAL | Status: DC

## 2014-03-21 NOTE — Telephone Encounter (Signed)
Pt needs refill of levothyroxine 0.025mg  #90, the system will not let me enter the request because there is no diagnosis, please review and advise

## 2014-03-21 NOTE — Telephone Encounter (Signed)
I took care of it.  Rx e-prescribed

## 2014-03-25 NOTE — Progress Notes (Signed)
Rx called in to pharmacy. Hilda Blades Saory Carriero RN 03/25/14 10:25AM

## 2014-03-26 ENCOUNTER — Encounter: Payer: No Typology Code available for payment source | Admitting: Family Medicine

## 2014-03-28 ENCOUNTER — Telehealth: Payer: Self-pay | Admitting: *Deleted

## 2014-03-28 NOTE — Telephone Encounter (Signed)
Pt states she needs a humidifier for her C-PAP machine, pt states she was told she doesn't have the right kind of humidifier, she states she will call back with the correct info, [facility], she will call monday

## 2014-04-01 ENCOUNTER — Encounter: Payer: Self-pay | Admitting: *Deleted

## 2014-04-09 LAB — HM DIABETES EYE EXAM

## 2014-04-21 ENCOUNTER — Encounter: Payer: Self-pay | Admitting: *Deleted

## 2014-05-08 ENCOUNTER — Encounter: Payer: No Typology Code available for payment source | Admitting: Pulmonary Disease

## 2014-05-19 ENCOUNTER — Emergency Department (HOSPITAL_COMMUNITY): Payer: Self-pay

## 2014-05-19 ENCOUNTER — Inpatient Hospital Stay (HOSPITAL_COMMUNITY)
Admission: EM | Admit: 2014-05-19 | Discharge: 2014-05-23 | DRG: 125 | Disposition: A | Discharge status: Home / Self Care | Payer: Self-pay | Attending: Internal Medicine | Admitting: Internal Medicine

## 2014-05-19 ENCOUNTER — Encounter (HOSPITAL_COMMUNITY): Payer: Self-pay | Admitting: Emergency Medicine

## 2014-05-19 DIAGNOSIS — L0201 Cutaneous abscess of face: Secondary | ICD-10-CM

## 2014-05-19 DIAGNOSIS — E1165 Type 2 diabetes mellitus with hyperglycemia: Secondary | ICD-10-CM | POA: Diagnosis present

## 2014-05-19 DIAGNOSIS — E669 Obesity, unspecified: Secondary | ICD-10-CM | POA: Diagnosis present

## 2014-05-19 DIAGNOSIS — E1122 Type 2 diabetes mellitus with diabetic chronic kidney disease: Secondary | ICD-10-CM | POA: Diagnosis present

## 2014-05-19 DIAGNOSIS — I10 Essential (primary) hypertension: Secondary | ICD-10-CM | POA: Diagnosis present

## 2014-05-19 DIAGNOSIS — L0390 Cellulitis, unspecified: Secondary | ICD-10-CM

## 2014-05-19 DIAGNOSIS — Z794 Long term (current) use of insulin: Secondary | ICD-10-CM

## 2014-05-19 DIAGNOSIS — R220 Localized swelling, mass and lump, head: Secondary | ICD-10-CM

## 2014-05-19 DIAGNOSIS — Z8614 Personal history of Methicillin resistant Staphylococcus aureus infection: Secondary | ICD-10-CM

## 2014-05-19 DIAGNOSIS — E1142 Type 2 diabetes mellitus with diabetic polyneuropathy: Secondary | ICD-10-CM

## 2014-05-19 DIAGNOSIS — F17210 Nicotine dependence, cigarettes, uncomplicated: Secondary | ICD-10-CM | POA: Diagnosis present

## 2014-05-19 DIAGNOSIS — F319 Bipolar disorder, unspecified: Secondary | ICD-10-CM | POA: Diagnosis present

## 2014-05-19 DIAGNOSIS — J42 Unspecified chronic bronchitis: Secondary | ICD-10-CM | POA: Diagnosis present

## 2014-05-19 DIAGNOSIS — E038 Other specified hypothyroidism: Secondary | ICD-10-CM | POA: Diagnosis present

## 2014-05-19 DIAGNOSIS — F419 Anxiety disorder, unspecified: Secondary | ICD-10-CM | POA: Diagnosis present

## 2014-05-19 DIAGNOSIS — H00033 Abscess of eyelid right eye, unspecified eyelid: Principal | ICD-10-CM | POA: Diagnosis present

## 2014-05-19 DIAGNOSIS — B9689 Other specified bacterial agents as the cause of diseases classified elsewhere: Secondary | ICD-10-CM

## 2014-05-19 DIAGNOSIS — G4733 Obstructive sleep apnea (adult) (pediatric): Secondary | ICD-10-CM | POA: Diagnosis present

## 2014-05-19 DIAGNOSIS — E039 Hypothyroidism, unspecified: Secondary | ICD-10-CM | POA: Diagnosis present

## 2014-05-19 DIAGNOSIS — L0203 Carbuncle of face: Secondary | ICD-10-CM

## 2014-05-19 DIAGNOSIS — Z6841 Body Mass Index (BMI) 40.0 and over, adult: Secondary | ICD-10-CM

## 2014-05-19 DIAGNOSIS — D638 Anemia in other chronic diseases classified elsewhere: Secondary | ICD-10-CM | POA: Diagnosis present

## 2014-05-19 DIAGNOSIS — I509 Heart failure, unspecified: Secondary | ICD-10-CM | POA: Diagnosis present

## 2014-05-19 DIAGNOSIS — E119 Type 2 diabetes mellitus without complications: Secondary | ICD-10-CM | POA: Diagnosis present

## 2014-05-19 DIAGNOSIS — E1159 Type 2 diabetes mellitus with other circulatory complications: Secondary | ICD-10-CM | POA: Diagnosis present

## 2014-05-19 DIAGNOSIS — K219 Gastro-esophageal reflux disease without esophagitis: Secondary | ICD-10-CM | POA: Diagnosis present

## 2014-05-19 DIAGNOSIS — E1151 Type 2 diabetes mellitus with diabetic peripheral angiopathy without gangrene: Secondary | ICD-10-CM | POA: Diagnosis present

## 2014-05-19 DIAGNOSIS — L03211 Cellulitis of face: Secondary | ICD-10-CM

## 2014-05-19 DIAGNOSIS — L03213 Periorbital cellulitis: Secondary | ICD-10-CM | POA: Diagnosis present

## 2014-05-19 DIAGNOSIS — E1110 Type 2 diabetes mellitus with ketoacidosis without coma: Secondary | ICD-10-CM

## 2014-05-19 DIAGNOSIS — E785 Hyperlipidemia, unspecified: Secondary | ICD-10-CM | POA: Diagnosis present

## 2014-05-19 DIAGNOSIS — F329 Major depressive disorder, single episode, unspecified: Secondary | ICD-10-CM | POA: Diagnosis present

## 2014-05-19 DIAGNOSIS — Z881 Allergy status to other antibiotic agents status: Secondary | ICD-10-CM

## 2014-05-19 HISTORY — DX: Cellulitis, unspecified: L03.90

## 2014-05-19 LAB — CBC WITH DIFFERENTIAL/PLATELET
Basophils Absolute: 0 10*3/uL (ref 0.0–0.1)
Basophils Relative: 0 % (ref 0–1)
EOS PCT: 2 % (ref 0–5)
Eosinophils Absolute: 0.1 10*3/uL (ref 0.0–0.7)
HEMATOCRIT: 32.3 % — AB (ref 36.0–46.0)
Hemoglobin: 10.3 g/dL — ABNORMAL LOW (ref 12.0–15.0)
LYMPHS ABS: 2.5 10*3/uL (ref 0.7–4.0)
Lymphocytes Relative: 33 % (ref 12–46)
MCH: 25.2 pg — AB (ref 26.0–34.0)
MCHC: 31.9 g/dL (ref 30.0–36.0)
MCV: 79.2 fL (ref 78.0–100.0)
MONOS PCT: 7 % (ref 3–12)
Monocytes Absolute: 0.5 10*3/uL (ref 0.1–1.0)
Neutro Abs: 4.3 10*3/uL (ref 1.7–7.7)
Neutrophils Relative %: 58 % (ref 43–77)
PLATELETS: 227 10*3/uL (ref 150–400)
RBC: 4.08 MIL/uL (ref 3.87–5.11)
RDW: 13.2 % (ref 11.5–15.5)
WBC: 7.5 10*3/uL (ref 4.0–10.5)

## 2014-05-19 LAB — COMPREHENSIVE METABOLIC PANEL
ALT: 12 U/L (ref 0–35)
AST: 13 U/L (ref 0–37)
Albumin: 3 g/dL — ABNORMAL LOW (ref 3.5–5.2)
Alkaline Phosphatase: 91 U/L (ref 39–117)
Anion gap: 13 (ref 5–15)
BUN: 17 mg/dL (ref 6–23)
CALCIUM: 8.9 mg/dL (ref 8.4–10.5)
CO2: 21 mmol/L (ref 19–32)
Chloride: 103 mEq/L (ref 96–112)
Creatinine, Ser: 0.86 mg/dL (ref 0.50–1.10)
GFR, EST NON AFRICAN AMERICAN: 85 mL/min — AB (ref >90)
GLUCOSE: 294 mg/dL — AB (ref 70–99)
Potassium: 4.2 mmol/L (ref 3.5–5.1)
Sodium: 137 mmol/L (ref 135–145)
Total Bilirubin: 0.1 mg/dL — ABNORMAL LOW (ref 0.3–1.2)
Total Protein: 6.9 g/dL (ref 6.0–8.3)

## 2014-05-19 LAB — URINALYSIS, ROUTINE W REFLEX MICROSCOPIC
Bilirubin Urine: NEGATIVE
Glucose, UA: >1000 mg/dL — AB
Ketones, ur: NEGATIVE mg/dL
Leukocytes, UA: NEGATIVE
NITRITE: NEGATIVE
PROTEIN: 100 mg/dL — AB
SPECIFIC GRAVITY, URINE: 1.045 — AB (ref 1.005–1.030)
UROBILINOGEN UA: 0.2 mg/dL (ref 0.0–1.0)
pH: 5.5 (ref 5.0–8.0)

## 2014-05-19 LAB — GLUCOSE, CAPILLARY: Glucose-Capillary: 308 mg/dL — ABNORMAL HIGH (ref 70–99)

## 2014-05-19 LAB — URINE MICROSCOPIC-ADD ON

## 2014-05-19 MED ORDER — MORPHINE SULFATE 4 MG/ML IJ SOLN
4.0000 mg | Freq: Once | INTRAMUSCULAR | Status: AC
  Administered 2014-05-19: 4 mg via INTRAVENOUS
  Filled 2014-05-19: qty 1

## 2014-05-19 MED ORDER — ACETAMINOPHEN 650 MG RE SUPP: 650.0000 mg | Freq: Four times a day (QID) | RECTAL | Status: DC | PRN

## 2014-05-19 MED ORDER — ACETAMINOPHEN 325 MG PO TABS
650.0000 mg | ORAL_TABLET | Freq: Four times a day (QID) | ORAL | Status: DC | PRN
  Administered 2014-05-20: 650 mg via ORAL
  Filled 2014-05-19: qty 2

## 2014-05-19 MED ORDER — CLINDAMYCIN PHOSPHATE 600 MG/50ML IV SOLN
600.0000 mg | Freq: Once | INTRAVENOUS | Status: AC
  Administered 2014-05-19: 600 mg via INTRAVENOUS
  Filled 2014-05-19: qty 50

## 2014-05-19 MED ORDER — ACETAMINOPHEN 325 MG PO TABS
650.0000 mg | ORAL_TABLET | Freq: Once | ORAL | Status: AC
  Administered 2014-05-19: 650 mg via ORAL
  Filled 2014-05-19: qty 2

## 2014-05-19 MED ORDER — GABAPENTIN 300 MG PO CAPS
600.0000 mg | ORAL_CAPSULE | Freq: Every day | ORAL | Status: DC
  Administered 2014-05-20 – 2014-05-22 (×2): 600 mg via ORAL
  Filled 2014-05-19 (×5): qty 2

## 2014-05-19 MED ORDER — GABAPENTIN 600 MG PO TABS: 600.0000 mg | ORAL_TABLET | Freq: Every day | ORAL | Status: DC

## 2014-05-19 MED ORDER — IRBESARTAN 150 MG PO TABS
150.0000 mg | ORAL_TABLET | Freq: Every day | ORAL | Status: DC
  Administered 2014-05-20 – 2014-05-23 (×3): 150 mg via ORAL
  Filled 2014-05-19 (×4): qty 1

## 2014-05-19 MED ORDER — LEVOTHYROXINE SODIUM 25 MCG PO TABS
25.0000 ug | ORAL_TABLET | Freq: Every day | ORAL | Status: DC
  Administered 2014-05-20 – 2014-05-23 (×4): 25 ug via ORAL
  Filled 2014-05-19 (×5): qty 1

## 2014-05-19 MED ORDER — DIPHENHYDRAMINE HCL 25 MG PO CAPS
25.0000 mg | ORAL_CAPSULE | Freq: Four times a day (QID) | ORAL | Status: DC | PRN
  Administered 2014-05-20: 25 mg via ORAL
  Filled 2014-05-19: qty 1

## 2014-05-19 MED ORDER — IOHEXOL 300 MG/ML  SOLN
100.0000 mL | Freq: Once | INTRAMUSCULAR | Status: AC | PRN
  Administered 2014-05-19: 100 mL via INTRAVENOUS

## 2014-05-19 MED ORDER — HYDROCODONE-ACETAMINOPHEN 5-325 MG PO TABS
1.0000 | ORAL_TABLET | Freq: Four times a day (QID) | ORAL | Status: DC | PRN
  Administered 2014-05-20 (×2): 1 via ORAL
  Filled 2014-05-19 (×2): qty 1

## 2014-05-19 MED ORDER — ROSUVASTATIN CALCIUM 10 MG PO TABS
10.0000 mg | ORAL_TABLET | Freq: Every day | ORAL | Status: DC
  Administered 2014-05-19 – 2014-05-22 (×4): 10 mg via ORAL
  Filled 2014-05-19 (×5): qty 1

## 2014-05-19 MED ORDER — INSULIN GLARGINE 100 UNIT/ML ~~LOC~~ SOLN
15.0000 [IU] | Freq: Every day | SUBCUTANEOUS | Status: DC
  Administered 2014-05-19 – 2014-05-21 (×3): 15 [IU] via SUBCUTANEOUS
  Filled 2014-05-19 (×4): qty 0.15

## 2014-05-19 MED ORDER — HYDROMORPHONE HCL 1 MG/ML IJ SOLN
1.0000 mg | Freq: Once | INTRAMUSCULAR | Status: AC
  Administered 2014-05-19: 1 mg via INTRAVENOUS
  Filled 2014-05-19: qty 1

## 2014-05-19 MED ORDER — BUSPIRONE HCL 10 MG PO TABS
10.0000 mg | ORAL_TABLET | Freq: Two times a day (BID) | ORAL | Status: DC
  Administered 2014-05-19 – 2014-05-20 (×2): 10 mg via ORAL
  Filled 2014-05-19 (×3): qty 1

## 2014-05-19 MED ORDER — SODIUM CHLORIDE 0.9 % IV SOLN
INTRAVENOUS | Status: DC
  Administered 2014-05-19: 18:00:00 via INTRAVENOUS

## 2014-05-19 MED ORDER — FLUOXETINE HCL 20 MG PO TABS
20.0000 mg | ORAL_TABLET | Freq: Every day | ORAL | Status: DC
  Administered 2014-05-20: 20 mg via ORAL
  Filled 2014-05-19: qty 1

## 2014-05-19 MED ORDER — GABAPENTIN 300 MG PO CAPS
300.0000 mg | ORAL_CAPSULE | Freq: Every morning | ORAL | Status: DC
  Administered 2014-05-20: 300 mg via ORAL
  Filled 2014-05-19 (×4): qty 1

## 2014-05-19 MED ORDER — LINEZOLID 2 MG/ML IV SOLN
600.0000 mg | Freq: Two times a day (BID) | INTRAVENOUS | Status: DC
  Administered 2014-05-19 – 2014-05-22 (×6): 600 mg via INTRAVENOUS
  Filled 2014-05-19 (×7): qty 300

## 2014-05-19 MED ORDER — ONDANSETRON HCL 4 MG/2ML IJ SOLN
4.0000 mg | Freq: Once | INTRAMUSCULAR | Status: AC
  Administered 2014-05-19: 4 mg via INTRAVENOUS
  Filled 2014-05-19: qty 2

## 2014-05-19 MED ORDER — HYDROCHLOROTHIAZIDE 25 MG PO TABS
25.0000 mg | ORAL_TABLET | Freq: Every day | ORAL | Status: DC
  Administered 2014-05-20 – 2014-05-23 (×4): 25 mg via ORAL
  Filled 2014-05-19 (×4): qty 1

## 2014-05-19 MED ORDER — INSULIN ASPART 100 UNIT/ML ~~LOC~~ SOLN
0.0000 [IU] | Freq: Three times a day (TID) | SUBCUTANEOUS | Status: DC
  Administered 2014-05-20: 11 [IU] via SUBCUTANEOUS
  Administered 2014-05-20: 15 [IU] via SUBCUTANEOUS
  Administered 2014-05-20: 11 [IU] via SUBCUTANEOUS
  Administered 2014-05-21: 7 [IU] via SUBCUTANEOUS
  Administered 2014-05-21: 3 [IU] via SUBCUTANEOUS
  Administered 2014-05-21: 7 [IU] via SUBCUTANEOUS
  Administered 2014-05-21: 11 [IU] via SUBCUTANEOUS
  Administered 2014-05-22 – 2014-05-23 (×4): 7 [IU] via SUBCUTANEOUS
  Administered 2014-05-23: 4 [IU] via SUBCUTANEOUS

## 2014-05-19 MED ORDER — ENOXAPARIN SODIUM 40 MG/0.4ML ~~LOC~~ SOLN
40.0000 mg | SUBCUTANEOUS | Status: DC
  Administered 2014-05-19 – 2014-05-20 (×2): 40 mg via SUBCUTANEOUS
  Filled 2014-05-19 (×3): qty 0.4

## 2014-05-19 NOTE — H&P (Signed)
Date: 05/19/2014               Patient Name:  [name] [name] MRN: [id number]  DOB: [birth year] Age / Sex: 38 y.o., female   PCP: Jacques Earthly, MD              Medical Service: Internal Medicine Teaching Service              Attending Physician: Dr. Axel Filler, MD    First Contact: Dr. Arcelia Jew Pager: [phone]  Second Contact: Dr. Denton Brick Pager: [phone]            After Hours (After 5p/  First Contact Pager: [phone]  weekends / holidays): Second Contact Pager: [phone]   Chief Complaint:  Face swelling  History of Present Illness: [name] [name] is a 38 year old woman with history of HLD, HTN, DM2 with peripheral neuropathy, hypothyroidism, anxiety, bipolar disorder, multiple skin abscesses, and group B strep endocarditis in 2012 presenting with R sided facial swelling with pus drainage.  She reports developing a painful pimple on the R side of her face 4 days ago at her hair line.  She recently had her hair braided, so she thought it was due to her hair being too tight.  She tried to pop the pimple and expressed a "white speck."  Since that time, she has developed increasing swelling that has spread to involve her R eye. She also noticed a painful not below her R ear.  She says that she has had some clear drainage from the swelling near her hair line. The swelling around her eye is painful, and the pain is worse when she lays down. She has tried warm compresses and ice without relief.  Yesterday, the skin started to become itchy, and she has tried hydroxyzine without relief. She has chronic blurry vision, but she denies any recent changes.  She denies pain with movement of her eye, proptosis, or diplopia.  She also denies fevers, chills or headaches.  She does report some slight sensitivity to light.  She denies any recent dental work or tooth pan.  She had a RLQ abdominal abscess in 04/2013 that grew MSSA that was pan-sensitive.  She also had a left thigh necrotizing  fascitis in 07/2012 that grew MRSA resistant to clindamycin, but sensitive to tetracycline and TMP/SMX.  She also had cellulitis on her chin in 03/2011 that was treated with vanc/Zosyn that was narrowed to cefepime then switched to Primaxin and finally to Bactrim after an ID consult.  She has multiple other MRSA infections that have been sensitive to clinda, tetracyclines, and TMP/SMX that have typically been treated with clindamycin, Bactrim and doxycycline.  In the ER, she was started on clindamycin IV.  Review of Systems: Review of Systems  Constitutional: Negative for fever and chills.  HENT: Negative for congestion and sore throat.   Eyes: Positive for blurred vision. Negative for double vision, pain, discharge and redness.  Respiratory: Negative for cough and shortness of breath.   Cardiovascular: Negative for chest pain and leg swelling.  Gastrointestinal: Negative for nausea, vomiting, abdominal pain, diarrhea and constipation.  Genitourinary: Negative for dysuria.  Musculoskeletal: Negative for myalgias and neck pain.  Skin: Positive for itching and rash.  Neurological: Negative for dizziness, sensory change, focal weakness, weakness and headaches.   Meds:  (Not in a hospital admission) Current Facility-Administered Medications  Medication Dose Route Frequency Provider Last Rate Last Dose  . 0.9 %  sodium chloride infusion  Intravenous STAT Ezequiel Essex, MD       Current Outpatient Prescriptions  Medication Sig Dispense Refill  . Blood Glucose Monitoring Suppl (BLOOD GLUCOSE METER) kit Check blood sugar up to 3 times a day dx code 250.02 insulin requiring 1 each 0  . FLUoxetine (PROZAC) 20 MG tablet Take 1 tablet (20 mg total) by mouth daily. 30 tablet 3  . glucose blood (WAVESENSE PRESTO) test strip Check blood sugar up to 3 times a day dx code 250.02 insulin requiring 100 each 12  . hydrochlorothiazide (HYDRODIURIL) 25 MG tablet Take 1 tablet (25 mg total) by mouth daily. 30  tablet 2  . insulin aspart (NOVOLOG) 100 UNIT/ML injection Inject 5 Units into the skin 3 (three) times daily with meals. 10 mL 12  . insulin glargine (LANTUS) 100 UNIT/ML injection Inject 0.15 mLs (15 Units total) into the skin at bedtime. 10 mL 12  . Lancets 30G MISC Check blood sugar up to 3 times a day dx code 250.02 insulin requiring 100 each 5  . levothyroxine (SYNTHROID, LEVOTHROID) 25 MCG tablet Take 1 tablet (25 mcg total) by mouth daily before breakfast. 30 tablet 0  . metFORMIN (GLUCOPHAGE) 1000 MG tablet Take 1 tablet (1,000 mg total) by mouth 2 (two) times daily with a meal. 60 tablet 3  . olmesartan (BENICAR) 20 MG tablet Take 1 tablet (20 mg total) by mouth daily. 30 tablet 12  . rosuvastatin (CRESTOR) 10 MG tablet Take 1 tablet (10 mg total) by mouth at bedtime. 30 tablet 11  . busPIRone (BUSPAR) 10 MG tablet Take 1 tablet (10 mg total) by mouth 2 (two) times daily. 60 tablet 0  . gabapentin (NEURONTIN) 300 MG capsule Take 300 mg in the morning and lunch and 600 mg at night 120 capsule 1    Allergies: Allergies as of 05/19/2014 - Review Complete 05/19/2014  Allergen Reaction Noted  . Vancomycin  08/24/2012   Past Medical History  Diagnosis Date  . Hyperlipidemia   . Hypertension   . Abscess     history of multiple abscesses  . Obesity   . Peripheral neuropathy   . Abnormal Pap smear of cervix 2009  . Endocarditis 2002    subacute bacterial endocarditis.   . Edema of lower extremity   . Family history of anesthesia complication     "my mom has a hard time coming out from under"  . Heart murmur   . Chronic bronchitis     "get it q yr" (05/13/2013)  . OSA on CPAP   . Hypothyroidism   . Type II diabetes mellitus   . Anemia of chronic disease 2002  . History of blood transfusion     "just low blood count" (05/13/2013)  . GERD (gastroesophageal reflux disease)   . Chronic pain   . Anxiety   . Depression   . Bipolar disorder   . CHF (congestive heart failure)      pt denies this hx on 05/13/2013  . Hypothyroidism, adult 03/21/2014   Past Surgical History  Procedure Laterality Date  . Incision and drainage abscess      multiple I&Ds  . Irrigation and debridement abscess Left 07/06/2012    Procedure: IRRIGATION AND DEBRIDEMENT ABSCESS BUTTOCKS AND THIGH;  Surgeon: Shann Medal, MD;  Location: [facility];  Service: General;  Laterality: Left;  . Incision and drainage of wound Left 07/07/2012    Procedure: IRRIGATION AND DEBRIDEMENT WOUND;  Surgeon: Harl Bowie, MD;  Location: [facility];  Service: General;  Laterality: Left;  . Incision and drainage abscess Left 07/09/2012    Procedure: DRESSING CHANGE, THIGH WOUND;  Surgeon: Harl Bowie, MD;  Location: [facility];  Service: General;  Laterality: Left;  . Incision and drainage perirectal abscess Left 07/14/2012    Procedure: DEBRIDEMENT OF SKIN & SOFT TISSUE; DRESSING CHANGE UNDER ANESTHESIA;  Surgeon: Gayland Curry, MD,FACS;  Location: [facility];  Service: General;  Laterality: Left;  . Incision and drainage perirectal abscess Left 07/16/2012    Procedure: I&D Left Thigh;  Surgeon: Gwenyth Ober, MD;  Location: [facility];  Service: General;  Laterality: Left;  . Irrigation and debridement abscess Left 08/10/2012    Procedure: IRRIGATION AND DEBRIDEMENT ABSCESS;  Surgeon: Madilyn Hook, DO;  Location: [facility];  Service: General;  Laterality: Left;  . Tonsillectomy  1994   Family History  Problem Relation Age of Onset  . Heart failure Mother   . Diabetes Mother   . Kidney disease Mother   . Kidney disease Father   . Diabetes Paternal Grandmother   . Heart failure Paternal Grandmother    History   Social History  . Marital Status: Single    Spouse Name: N/A    Number of Children: N/A  . Years of Education: N/A   Occupational History  . telemarketer     Phone surveys   Social History Main Topics  . Smoking status: Current Every Day Smoker -- 0.25 packs/day for 6 years    Types: Cigarettes  . Smokeless  tobacco: Never Used     Comment: "TRYING TO QUIT"  . Alcohol Use: No  . Drug Use: No  . Sexual Activity: Not Currently   Other Topics Concern  . Not on file   Social History Narrative   Lives with mother currently,     Physical Exam: Filed Vitals:   05/19/14 1836  BP: 139/91  Pulse: 80  Temp: 98.2 F (36.8 C)  Resp: 16   Physical Exam  Constitutional: She is oriented to person, place, and time and well-developed, well-nourished, and in no distress. No distress.  HENT:  Swelling of right temple with apparent fluctuance, but no drainage.  Warm to touch. Swelling extends to R eyelid and to bottom of R ear.  1 cm tender tonsillar lymph node.  Eyes: Conjunctivae and EOM are normal. Pupils are equal, round, and reactive to light. No scleral icterus.  No proptosis. Vision intact.  Neck: Normal range of motion. Neck supple. No thyromegaly present.  Cardiovascular: Normal rate, regular rhythm and normal heart sounds.   Pulmonary/Chest: Effort normal and breath sounds normal. No respiratory distress.  Abdominal: Soft. Bowel sounds are normal. She exhibits no distension. There is no tenderness.  Musculoskeletal: Normal range of motion. She exhibits no edema or tenderness.  Neurological: She is alert and oriented to person, place, and time. No cranial nerve deficit. She exhibits normal muscle tone. Coordination normal.  Skin: She is not diaphoretic.  Psychiatric: Affect normal.    Lab results: Basic Metabolic Panel:  Recent Labs  05/19/14 1517  NA 137  K 4.2  CL 103  CO2 21  GLUCOSE 294*  BUN 17  CREATININE 0.86  CALCIUM 8.9   Liver Function Tests:  Recent Labs  05/19/14 1517  AST 13  ALT 12  ALKPHOS 91  BILITOT 0.1*  PROT 6.9  ALBUMIN 3.0*   CBC:  Recent Labs  05/19/14 1517  WBC 7.5  NEUTROABS 4.3  HGB 10.3*  HCT 32.3*  MCV 79.2  PLT 227   Urinalysis:  Recent Labs  05/19/14 1738  COLORURINE YELLOW  LABSPEC 1.045*  PHURINE 5.5  GLUCOSEU >1000*    HGBUR MODERATE*  BILIRUBINUR NEGATIVE  KETONESUR NEGATIVE  PROTEINUR 100*  UROBILINOGEN 0.2  NITRITE NEGATIVE  LEUKOCYTESUR NEGATIVE    Imaging results:  Ct Maxillofacial W/cm  05/19/2014   CLINICAL DATA:  RIGHT facial swelling for 2 days, history of RIGHT facial abscess. No difficulty breathing.  EXAM: CT MAXILLOFACIAL WITH CONTRAST  TECHNIQUE: Multidetector CT imaging of the maxillofacial structures was performed with intravenous contrast. Multiplanar CT image reconstructions were also generated. A small metallic BB was placed on the right temple in order to reliably differentiate right from left.  CONTRAST:  167m OMNIPAQUE IOHEXOL 300 MG/ML  SOLN  COMPARISON:  CT maxillofacial January 03, 2013  FINDINGS: RIGHT facial subcutaneous fat stranding, trace lentiform effusion best seen on axial 78/90 without rim enhancing fluid collections, subcutaneous gas or radiopaque foreign bodies.  RIGHT jugulodigastric lymphadenopathy; 11 mm short axis RIGHT level IIb lymph node.  Normal appearance of the major salivary glands. Normal included thyroid gland. Normal appearance of visualized or digestive tract.  Normal appearance of the ocular globes and orbital contents. RIGHT periorbital soft tissue swelling. Multiple subcentimeter mucosal retention cyst, ethmoid mucosal thickening, no paranasal sinus air-fluid levels. Nasal septum is midline. Bilateral concha bullosa, partially obscured with soft tissue on the LEFT. No destructive bony lesions. Normal visualized intracranial contents.  IMPRESSION: Extensive RIGHT facial/periorbital cellulitis, trace effusion without abscess. No postseptal extent.  RIGHT neck lymphadenopathy is likely reactive.   Electronically Signed   By: CElon Alas  On: 05/19/2014 17:23    Assessment & Plan by Problem: Active Problems:   Facial cellulitis   #Pre-septal cellulitis No involvement in the orbit on CT and no signs or symptoms of orbital cellulitis.  No SIRS criteria.   Kidney failure from vancomycin in the past.  History of multiple skin abscesses in the past with MRSA, typically sensitive to typical treatments, but was resistant to clindamycin once.  Touched base with Dr. VTommy Medalof ID who recommended switching to linezolid.  Patient likely colonized with MRSA.  Although appeared fluctuant on exam, nothing to drain on CT. -Admit to med/surg. -Switch to linezolid based on ID recs. -Norco 5-325 mg q6h PRN for pain. -Consider decontamination with chlorhexidine as outpatient. -Check MRSA in nares and consider contact precautions. -Benadryl for itching. -Check HIV. -Consult social work regarding continued need for linezolid.  #Uncontrolled diabetes with peripheral neuropathy Last A1c 8.6 on 02/03/14.  Reports being taken off of metformin, so she has not been taking it. -Check A1C. -Diabetic diet. -CBGs ACHS. -Hold metformin for now, continue restarting on discharge with elevated blood sugar. -Continue home Lanuts 15 units QHS. -SSI resistant. -Continue home gabapentin.  #Hypertension Blood pressure slightly elevated. -Continue to monitor.  -Continue home HCTZ 25 mg daily and Benicar 20 mg daily.  #Hyperlipidemia -Continue home statin.  #Hypothyroidism Last TSH 4.670 on 02/03/14, which has improved. -Continue Synthroid 25 mcg daily. -Check TSH.  #Anxiety and bipolar disorder Mental status stable. -Continue home Buspar and Prozac.  #DVT prophylaxis -Lovenox.  Dispo: Disposition is deferred at this time, awaiting improvement of current medical problems. Anticipated discharge in approximately 2-3 day(s).   The patient does have a current PCP (Jacques Earthly MD), therefore will be require OPC follow-up after discharge.   The patient does not have transportation limitations that hinder transportation to clinic appointments.  Signed:  Arman Filter, MD, PhD PGY-1 Internal Medicine Teaching Service Pager: [phone] 05/19/2014, 7:09 PM

## 2014-05-19 NOTE — ED Notes (Signed)
Pt reports for the past two days having R facial swelling; states has hx of abscesses in face that have needed to be drained surgically; denies difficulty breathing

## 2014-05-19 NOTE — ED Notes (Signed)
Patient states she started having a pimple on the right side of her face it got bigger and started draining. States last pm right side of face started swelling and swelling in to her eye. Patient is able to open her eye however it is very swollen. Patient states she has had multiple abscess before.

## 2014-05-19 NOTE — ED Provider Notes (Signed)
CSN: [id number]     Arrival date & time 05/19/14  1056 History   First MD Initiated Contact with Patient 05/19/14 1500     Chief Complaint  Patient presents with  . Facial Swelling     (Consider location/radiation/quality/duration/timing/severity/associated sxs/prior Treatment) HPI Comments: Four-day history of right-sided facial swelling and drainage of pus. Patient states she noticed a pimple develop on the side of her face about 4 days ago has increased eyes. Swelling has progressed to involve her right eye. Denies any fevers, nausea or vomiting. She is a diabetic. She denies any vision change. No chest pain or shortness of breath. History of multiple abscesses requiring drainage in the past. Denies any wounds to the area.  The history is provided by the patient.    Past Medical History  Diagnosis Date  . Hyperlipidemia   . Hypertension   . Abscess     history of multiple abscesses  . Obesity   . Peripheral neuropathy   . Abnormal Pap smear of cervix 2009  . Endocarditis 2002    subacute bacterial endocarditis.   . Edema of lower extremity   . Family history of anesthesia complication     "my mom has a hard time coming out from under"  . Heart murmur   . Chronic bronchitis     "get it q yr" (05/13/2013)  . OSA on CPAP   . Hypothyroidism   . Type II diabetes mellitus   . Anemia of chronic disease 2002  . History of blood transfusion     "just low blood count" (05/13/2013)  . GERD (gastroesophageal reflux disease)   . Chronic pain   . Anxiety   . Depression   . Bipolar disorder   . CHF (congestive heart failure)     pt denies this hx on 05/13/2013  . Hypothyroidism, adult 03/21/2014   Past Surgical History  Procedure Laterality Date  . Incision and drainage abscess      multiple I&Ds  . Irrigation and debridement abscess Left 07/06/2012    Procedure: IRRIGATION AND DEBRIDEMENT ABSCESS BUTTOCKS AND THIGH;  Surgeon: Shann Medal, MD;  Location: [place];  Service:  General;  Laterality: Left;  . Incision and drainage of wound Left 07/07/2012    Procedure: IRRIGATION AND DEBRIDEMENT WOUND;  Surgeon: Harl Bowie, MD;  Location: [place];  Service: General;  Laterality: Left;  . Incision and drainage abscess Left 07/09/2012    Procedure: DRESSING CHANGE, THIGH WOUND;  Surgeon: Harl Bowie, MD;  Location: [place];  Service: General;  Laterality: Left;  . Incision and drainage perirectal abscess Left 07/14/2012    Procedure: DEBRIDEMENT OF SKIN & SOFT TISSUE; DRESSING CHANGE UNDER ANESTHESIA;  Surgeon: Gayland Curry, MD,FACS;  Location: [place];  Service: General;  Laterality: Left;  . Incision and drainage perirectal abscess Left 07/16/2012    Procedure: I&D Left Thigh;  Surgeon: Gwenyth Ober, MD;  Location: [place];  Service: General;  Laterality: Left;  . Irrigation and debridement abscess Left 08/10/2012    Procedure: IRRIGATION AND DEBRIDEMENT ABSCESS;  Surgeon: Madilyn Hook, DO;  Location: [place];  Service: General;  Laterality: Left;  . Tonsillectomy  1994   Family History  Problem Relation Age of Onset  . Heart failure Mother   . Diabetes Mother   . Kidney disease Mother   . Kidney disease Father   . Diabetes Paternal Grandmother   . Heart failure Paternal Grandmother    History  Substance Use  Topics  . Smoking status: Current Every Day Smoker -- 0.25 packs/day for 6 years    Types: Cigarettes  . Smokeless tobacco: Never Used     Comment: "TRYING TO QUIT"  . Alcohol Use: No   OB History    No data available     Review of Systems  Constitutional: Negative for fever, activity change and appetite change.  Respiratory: Negative for cough, chest tightness and shortness of breath.   Cardiovascular: Negative for chest pain.  Gastrointestinal: Negative for nausea, vomiting and abdominal pain.  Genitourinary: Negative for dysuria and hematuria.  Musculoskeletal: Positive for myalgias and arthralgias. Negative for back pain.  Skin: Positive  for wound.  Neurological: Negative for dizziness, weakness and headaches.  A complete 10 system review of systems was obtained and all systems are negative except as noted in the HPI and PMH.      Allergies  Vancomycin  Home Medications   Prior to Admission medications   Medication Sig Start Date End Date Taking? Authorizing Provider  Blood Glucose Monitoring Suppl (BLOOD GLUCOSE METER) kit Check blood sugar up to 3 times a day dx code 250.02 insulin requiring 09/12/13  Yes Cresenciano Genre, MD  FLUoxetine (PROZAC) 20 MG tablet Take 1 tablet (20 mg total) by mouth daily. 02/05/14  Yes Aldine Contes, MD  glucose blood (WAVESENSE PRESTO) test strip Check blood sugar up to 3 times a day dx code 250.02 insulin requiring 09/12/13  Yes Cresenciano Genre, MD  hydrochlorothiazide (HYDRODIURIL) 25 MG tablet Take 1 tablet (25 mg total) by mouth daily. 09/11/13  Yes Cresenciano Genre, MD  insulin aspart (NOVOLOG) 100 UNIT/ML injection Inject 5 Units into the skin 3 (three) times daily with meals. 08/13/13  Yes Olga Millers, MD  insulin glargine (LANTUS) 100 UNIT/ML injection Inject 0.15 mLs (15 Units total) into the skin at bedtime. 07/23/13  Yes Ricke Hey, MD  Lancets 30G MISC Check blood sugar up to 3 times a day dx code 250.02 insulin requiring 09/12/13  Yes Cresenciano Genre, MD  levothyroxine (SYNTHROID, LEVOTHROID) 25 MCG tablet Take 1 tablet (25 mcg total) by mouth daily before breakfast. 03/21/14  Yes Annia Belt, MD  metFORMIN (GLUCOPHAGE) 1000 MG tablet Take 1 tablet (1,000 mg total) by mouth 2 (two) times daily with a meal. 09/11/13  Yes Cresenciano Genre, MD  olmesartan (BENICAR) 20 MG tablet Take 1 tablet (20 mg total) by mouth daily. 08/22/13  Yes Ricke Hey, MD  rosuvastatin (CRESTOR) 10 MG tablet Take 1 tablet (10 mg total) by mouth at bedtime. 08/21/13 08/21/14 Yes Ricke Hey, MD  busPIRone (BUSPAR) 10 MG tablet Take 1 tablet (10 mg total) by mouth 2 (two)  times daily. 04/16/13   Ricke Hey, MD  gabapentin (NEURONTIN) 300 MG capsule Take 300 mg in the morning and lunch and 600 mg at night 09/11/13   Cresenciano Genre, MD   BP 150/88 mmHg  Pulse 85  Temp(Src) 97.7 F (36.5 C) (Oral)  Resp 16  Ht _0  (1.778 m)  Wt 323 lb (146.512 kg)  BMI 46.35 kg/m2  SpO2 100%  LMP 03/10/2014 Physical Exam  Constitutional: She is oriented to person, place, and time. She appears well-developed and well-nourished. No distress.  HENT:  Head: Normocephalic and atraumatic.  Mouth/Throat: Oropharynx is clear and moist. No oropharyngeal exudate.  Induration and swelling to right temporal face with right periorbital edema. No pain with extraocular movements.  No tragus or  mastoid pain.  Eyes: Conjunctivae and EOM are normal. Pupils are equal, round, and reactive to light.  Neck: Normal range of motion. Neck supple.  No meningismus.  Cardiovascular: Normal rate, regular rhythm, normal heart sounds and intact distal pulses.   No murmur heard. Pulmonary/Chest: Effort normal and breath sounds normal. No respiratory distress.  Abdominal: Soft. There is no tenderness. There is no rebound and no guarding.  Musculoskeletal: Normal range of motion. She exhibits no edema or tenderness.  Neurological: She is alert and oriented to person, place, and time. No cranial nerve deficit. She exhibits normal muscle tone. Coordination normal.  No ataxia on finger to nose bilaterally. No pronator drift. 5/5 strength throughout. CN 2-12 intact. Negative Romberg. Equal grip strength. Sensation intact. Gait is normal.   Skin: Skin is warm.  Psychiatric: She has a normal mood and affect. Her behavior is normal.  Nursing note and vitals reviewed.     ED Course  Procedures (including critical care time) Labs Review Labs Reviewed  CBC WITH DIFFERENTIAL - Abnormal; Notable for the following:    Hemoglobin 10.3 (*)    HCT 32.3 (*)    MCH 25.2 (*)    All other  components within normal limits  COMPREHENSIVE METABOLIC PANEL - Abnormal; Notable for the following:    Glucose, Bld 294 (*)    Albumin 3.0 (*)    Total Bilirubin 0.1 (*)    GFR calc non Af Amer 85 (*)    All other components within normal limits  URINALYSIS, ROUTINE W REFLEX MICROSCOPIC - Abnormal; Notable for the following:    Specific Gravity, Urine 1.045 (*)    Glucose, UA >1000 (*)    Hgb urine dipstick MODERATE (*)    Protein, ur 100 (*)    All other components within normal limits  GLUCOSE, CAPILLARY - Abnormal; Notable for the following:    Glucose-Capillary 308 (*)    All other components within normal limits  MRSA PCR SCREENING  URINE MICROSCOPIC-ADD ON  BASIC METABOLIC PANEL  CBC  HEMOGLOBIN A1C  TSH  HIV ANTIBODY (ROUTINE TESTING)    Imaging Review Ct Maxillofacial W/cm  05/19/2014   CLINICAL DATA:  RIGHT facial swelling for 2 days, history of RIGHT facial abscess. No difficulty breathing.  EXAM: CT MAXILLOFACIAL WITH CONTRAST  TECHNIQUE: Multidetector CT imaging of the maxillofacial structures was performed with intravenous contrast. Multiplanar CT image reconstructions were also generated. A small metallic BB was placed on the right temple in order to reliably differentiate right from left.  CONTRAST:  171m OMNIPAQUE IOHEXOL 300 MG/ML  SOLN  COMPARISON:  CT maxillofacial January 03, 2013  FINDINGS: RIGHT facial subcutaneous fat stranding, trace lentiform effusion best seen on axial 78/90 without rim enhancing fluid collections, subcutaneous gas or radiopaque foreign bodies.  RIGHT jugulodigastric lymphadenopathy; 11 mm short axis RIGHT level IIb lymph node.  Normal appearance of the major salivary glands. Normal included thyroid gland. Normal appearance of visualized or digestive tract.  Normal appearance of the ocular globes and orbital contents. RIGHT periorbital soft tissue swelling. Multiple subcentimeter mucosal retention cyst, ethmoid mucosal thickening, no paranasal  sinus air-fluid levels. Nasal septum is midline. Bilateral concha bullosa, partially obscured with soft tissue on the LEFT. No destructive bony lesions. Normal visualized intracranial contents.  IMPRESSION: Extensive RIGHT facial/periorbital cellulitis, trace effusion without abscess. No postseptal extent.  RIGHT neck lymphadenopathy is likely reactive.   Electronically Signed   By: CElon Alas  On: 05/19/2014 17:23  EKG Interpretation None      MDM   Final diagnoses:  Facial swelling  Facial cellulitis   Facial abscess and cellulitis and a history of a diabetic. No fever or vision change.  No leukocytosis. CT does not show drainable fluid collection.  Clindamycin started given vancomycin allergy.  Admission d/w [facility] residents.   No evidence of septal cellulitis at this time.  No pain with EOMI.  Vision at baseline.   Ezequiel Essex, MD 05/20/14 [id number]

## 2014-05-20 LAB — CBC
HEMATOCRIT: 31.5 % — AB (ref 36.0–46.0)
Hemoglobin: 10.2 g/dL — ABNORMAL LOW (ref 12.0–15.0)
MCH: 25.9 pg — ABNORMAL LOW (ref 26.0–34.0)
MCHC: 32.4 g/dL (ref 30.0–36.0)
MCV: 79.9 fL (ref 78.0–100.0)
Platelets: 217 10*3/uL (ref 150–400)
RBC: 3.94 MIL/uL (ref 3.87–5.11)
RDW: 13.3 % (ref 11.5–15.5)
WBC: 6.2 10*3/uL (ref 4.0–10.5)

## 2014-05-20 LAB — HIV ANTIBODY (ROUTINE TESTING W REFLEX): HIV 1&2 Ab, 4th Generation: NONREACTIVE

## 2014-05-20 LAB — BASIC METABOLIC PANEL
ANION GAP: 7 (ref 5–15)
BUN: 16 mg/dL (ref 6–23)
CALCIUM: 8.7 mg/dL (ref 8.4–10.5)
CO2: 26 mmol/L (ref 19–32)
Chloride: 100 mEq/L (ref 96–112)
Creatinine, Ser: 0.85 mg/dL (ref 0.50–1.10)
GFR calc non Af Amer: 86 mL/min — ABNORMAL LOW (ref >90)
Glucose, Bld: 429 mg/dL — ABNORMAL HIGH (ref 70–99)
Potassium: 4.4 mmol/L (ref 3.5–5.1)
SODIUM: 133 mmol/L — AB (ref 135–145)

## 2014-05-20 LAB — GLUCOSE, CAPILLARY
GLUCOSE-CAPILLARY: 340 mg/dL — AB (ref 70–99)
GLUCOSE-CAPILLARY: 254 mg/dL — AB (ref 70–99)
Glucose-Capillary: 260 mg/dL — ABNORMAL HIGH (ref 70–99)
Glucose-Capillary: 315 mg/dL — ABNORMAL HIGH (ref 70–99)

## 2014-05-20 LAB — MRSA PCR SCREENING: MRSA by PCR: NEGATIVE

## 2014-05-20 LAB — HEMOGLOBIN A1C
HEMOGLOBIN A1C: 12.4 % — AB (ref <5.7)
MEAN PLASMA GLUCOSE: 309 mg/dL — AB (ref <117)

## 2014-05-20 LAB — TSH: TSH: 7.979 u[IU]/mL — ABNORMAL HIGH (ref 0.350–4.500)

## 2014-05-20 MED ORDER — OXYCODONE-ACETAMINOPHEN 5-325 MG PO TABS: 1.0000 | ORAL_TABLET | ORAL | Status: DC | PRN

## 2014-05-20 MED ORDER — MORPHINE SULFATE 2 MG/ML IJ SOLN
2.0000 mg | INTRAMUSCULAR | Status: DC | PRN
Start: 2014-05-20 — End: 2014-05-21
  Administered 2014-05-20 – 2014-05-21 (×3): 2 mg via INTRAVENOUS
  Filled 2014-05-20 (×4): qty 1

## 2014-05-20 MED ORDER — DIPHENHYDRAMINE HCL 25 MG PO CAPS: 25.0000 mg | ORAL_CAPSULE | Freq: Four times a day (QID) | ORAL | Status: DC | PRN

## 2014-05-20 NOTE — Progress Notes (Signed)
Subjective: Patient reports the swelling in her right eye has improved, but her pain is not well controlled. She states her pain is a burning sensation. She notes her eye has been tearing up.   Objective: Vital signs in last 24 hours: Filed Vitals:   05/19/14 1558 05/19/14 1836 05/19/14 2101 05/20/14 0425  BP: 153/92 139/91 150/88 146/86  Pulse:  80 85 77  Temp:  98.2 F (36.8 C) 97.7 F (36.5 C) 97.8 F (36.6 C)  TempSrc:  Oral    Resp:  16 16 16   Height:      Weight:      SpO2:  100% 100% 99%   Weight change:   Intake/Output Summary (Last 24 hours) at 05/20/14 1140 Last data filed at 05/20/14 0830  Gross per 24 hour  Intake    240 ml  Output      0 ml  Net    240 ml   Physical Exam General: alert, appears uncomfortable, sitting up in bed HEENT: Swelling of right temple with apparent fluctuance, but no drainage. Warm to touch. Swelling extends to R eyelid and to bottom of R ear as well as to the middle of the right neck. 1 cm tender tonsillar lymph node.  CV: RRR, normal S1/S2, no m/g/r Pulm: CTA bilaterally, breaths non-labored Abd: BS+, soft, obese, non-tender Ext: warm, no edema, moves all Neuro: alert and oriented x 3, no cranial nerve deficits  Lab Results: Basic Metabolic Panel:  Recent Labs Lab 05/19/14 1517 05/20/14 0500  NA 137 133*  K 4.2 4.4  CL 103 100  CO2 21 26  GLUCOSE 294* 429*  BUN 17 16  CREATININE 0.86 0.85  CALCIUM 8.9 8.7   Liver Function Tests:  Recent Labs Lab 05/19/14 1517  AST 13  ALT 12  ALKPHOS 91  BILITOT 0.1*  PROT 6.9  ALBUMIN 3.0*   CBC:  Recent Labs Lab 05/19/14 1517 05/20/14 0500  WBC 7.5 6.2  NEUTROABS 4.3  --   HGB 10.3* 10.2*  HCT 32.3* 31.5*  MCV 79.2 79.9  PLT 227 217   CBG:  Recent Labs Lab 05/19/14 2203 05/20/14 0645 05/20/14 1123  GLUCAP 308* 340* 260*   Thyroid Function Tests:  Recent Labs Lab 05/20/14 0500  TSH 7.979*   Urinalysis:  Recent Labs Lab 05/19/14 1738    COLORURINE YELLOW  LABSPEC 1.045*  PHURINE 5.5  GLUCOSEU >1000*  HGBUR MODERATE*  BILIRUBINUR NEGATIVE  KETONESUR NEGATIVE  PROTEINUR 100*  UROBILINOGEN 0.2  NITRITE NEGATIVE  LEUKOCYTESUR NEGATIVE    Micro Results: Recent Results (from the past 240 hour(s))  MRSA PCR Screening     Status: None   Collection Time: 05/19/14 11:22 PM  Result Value Ref Range Status   MRSA by PCR NEGATIVE NEGATIVE Final    Comment:        The GeneXpert MRSA Assay (FDA approved for NASAL specimens only), is one component of a comprehensive MRSA colonization surveillance program. It is not intended to diagnose MRSA infection nor to guide or monitor treatment for MRSA infections.    Studies/Results: Ct Maxillofacial W/cm  05/19/2014   CLINICAL DATA:  RIGHT facial swelling for 2 days, history of RIGHT facial abscess. No difficulty breathing.  EXAM: CT MAXILLOFACIAL WITH CONTRAST  TECHNIQUE: Multidetector CT imaging of the maxillofacial structures was performed with intravenous contrast. Multiplanar CT image reconstructions were also generated. A small metallic BB was placed on the right temple in order to reliably differentiate right from left.  CONTRAST:  13mL OMNIPAQUE IOHEXOL 300 MG/ML  SOLN  COMPARISON:  CT maxillofacial January 03, 2013  FINDINGS: RIGHT facial subcutaneous fat stranding, trace lentiform effusion best seen on axial 78/90 without rim enhancing fluid collections, subcutaneous gas or radiopaque foreign bodies.  RIGHT jugulodigastric lymphadenopathy; 11 mm short axis RIGHT level IIb lymph node.  Normal appearance of the major salivary glands. Normal included thyroid gland. Normal appearance of visualized or digestive tract.  Normal appearance of the ocular globes and orbital contents. RIGHT periorbital soft tissue swelling. Multiple subcentimeter mucosal retention cyst, ethmoid mucosal thickening, no paranasal sinus air-fluid levels. Nasal septum is midline. Bilateral concha bullosa,  partially obscured with soft tissue on the LEFT. No destructive bony lesions. Normal visualized intracranial contents.  IMPRESSION: Extensive RIGHT facial/periorbital cellulitis, trace effusion without abscess. No postseptal extent.  RIGHT neck lymphadenopathy is likely reactive.   Electronically Signed   By: Elon Alas   On: 05/19/2014 17:23   Medications: I have reviewed the patient's current medications. Scheduled Meds: . busPIRone  10 mg Oral BID  . enoxaparin (LOVENOX) injection  40 mg Subcutaneous Q24H  . FLUoxetine  20 mg Oral Daily  . gabapentin  300 mg Oral q morning - 10a  . gabapentin  600 mg Oral QHS  . hydrochlorothiazide  25 mg Oral Daily  . insulin aspart  0-20 Units Subcutaneous TID WC  . insulin glargine  15 Units Subcutaneous QHS  . irbesartan  150 mg Oral Daily  . levothyroxine  25 mcg Oral QAC breakfast  . linezolid  600 mg Intravenous Q12H  . rosuvastatin  10 mg Oral QHS   Continuous Infusions:  PRN Meds:.acetaminophen **OR** acetaminophen, diphenhydrAMINE, morphine injection Assessment/Plan: Principal Problem:   Preseptal cellulitis of right eye Active Problems:   Hyperlipidemia   Essential hypertension   Anxiety and depression   Diabetic peripheral neuropathy   DM (diabetes mellitus), type 2, uncontrolled   Hypothyroidism  Pre-septal Cellulitis: No signs or symptoms of orbital cellulitis. CT also negative. She has been afebrile and no leukocytosis. She reports swelling has mildly improved. She is still having a lot of pain, however. Will adjust pain regimen. MRSA negative on nose screen. HIV negative.  - Continue Linezolid 600 mg IV Q12H - Stop Norco, switch to Morphine 2 mg IV Q4H PRN - Benadryl 25 mg Q6H PRN itching  Uncontrolled Type 2 DM with peripheral neuropathy: Last A1c 8.6 on 02/03/14. Reports being taken off of metformin, so she has not been taking it. May need to restart upon discharge if repeat HbA1c elevated.  - Repeat HbA1c pending -  CBGs 4 times daily - Continue holding Metformin, may need to resume upon discharge - Continue home Lantus 15 units QHS - Resistant ISS - Continue home Gabapentin  HTN: BP stable in [id number] systolic.  - Continue home HCTZ 25 mg daily and Benicar 20 mg daily  HLD: - Continue home Crestor 10 mg daily  Hypothyroidism: Last TSH 4.670 on 02/03/14. She takes Synthroid 25 mcg daily at home. - Continue home Synthroid - Repeat TSH 7.979, could be elevated 2/2 acute illness. Will need repeat TSH as outpatient.   Anxiety and Bipolar Disorder - Continue home Buspar and Prozac  Diet: Carb modified  VTE PPx: Lovenox SQ Dispo: Disposition is deferred at this time, awaiting improvement of current medical problems.  Anticipated discharge in approximately 1-2 day(s).   The patient does have a current PCP Jacques Earthly, MD) and does need an [facility] hospital follow-up appointment after discharge.  The patient does not have transportation limitations that hinder transportation to clinic appointments.  .Services Needed at time of discharge: Y = Yes, Blank = No PT:   OT:   RN:   Equipment:   Other:     LOS: 1 day   Albin Felling, MD 05/20/2014, 11:40 AM

## 2014-05-20 NOTE — Progress Notes (Signed)
Inpatient Diabetes Program Recommendations  AACE/ADA: New Consensus Statement on Inpatient Glycemic Control (2013)  Target Ranges:  Prepandial:   less than 140 mg/dL      Peak postprandial:   less than 180 mg/dL (1-2 hours)      Critically ill patients:  140 - 180 mg/dL   Reason for Visit: Hyperglycemia  Diabetes history: DM2 Outpatient Diabetes medications: Lantus 15 units QHS, Novolog 5 units tidwc, metformin 1000 mg bid Current orders for Inpatient glycemic control: Lantus 15 units QHS, Novolog resistant tidwc  Results for ANAID, GUTH (MRN [id number]) as of 05/20/2014 15:40  Ref. Range 05/19/2014 22:03 05/20/2014 06:45 05/20/2014 11:23  Glucose-Capillary Latest Range: 70-99 mg/dL 308 (H) 340 (H) 260 (H)  Results for MERRITT, KINGMA (MRN [id number]) as of 05/20/2014 15:40  Ref. Range 05/19/2014 15:17 05/20/2014 05:00  Glucose Latest Range: 70-99 mg/dL 294 (H) 429 (H)   Uncontrolled blood sugars.   Inpatient Diabetes Program Recommendations Insulin - Meal Coverage: Add Novolog 4 units tidwc for meal coverage insulin HgbA1C: Need updated HgbA1C to assess glycemic control prior to hospitalization  Note: Will continue to follow. Thank you. Lorenda Peck, RD, LDN, CDE Inpatient Diabetes Coordinator [phone]

## 2014-05-20 NOTE — Progress Notes (Signed)
UR completed  Frann Rider, RN, BSN

## 2014-05-21 ENCOUNTER — Inpatient Hospital Stay (HOSPITAL_COMMUNITY): Payer: Self-pay

## 2014-05-21 ENCOUNTER — Encounter (HOSPITAL_COMMUNITY): Payer: Self-pay | Admitting: General Practice

## 2014-05-21 DIAGNOSIS — L0390 Cellulitis, unspecified: Secondary | ICD-10-CM

## 2014-05-21 DIAGNOSIS — H05019 Cellulitis of unspecified orbit: Secondary | ICD-10-CM

## 2014-05-21 HISTORY — DX: Cellulitis, unspecified: L03.90

## 2014-05-21 LAB — BASIC METABOLIC PANEL
ANION GAP: 10 (ref 5–15)
BUN: 13 mg/dL (ref 6–23)
CALCIUM: 8.8 mg/dL (ref 8.4–10.5)
CO2: 24 mmol/L (ref 19–32)
Chloride: 100 mEq/L (ref 96–112)
Creatinine, Ser: 0.8 mg/dL (ref 0.50–1.10)
Glucose, Bld: 293 mg/dL — ABNORMAL HIGH (ref 70–99)
Potassium: 4.6 mmol/L (ref 3.5–5.1)
SODIUM: 134 mmol/L — AB (ref 135–145)

## 2014-05-21 LAB — GLUCOSE, CAPILLARY
GLUCOSE-CAPILLARY: 232 mg/dL — AB (ref 70–99)
GLUCOSE-CAPILLARY: 141 mg/dL — AB (ref 70–99)
Glucose-Capillary: 272 mg/dL — ABNORMAL HIGH (ref 70–99)
Glucose-Capillary: 219 mg/dL — ABNORMAL HIGH (ref 70–99)
Glucose-Capillary: 224 mg/dL — ABNORMAL HIGH (ref 70–99)

## 2014-05-21 MED ORDER — INSULIN ASPART 100 UNIT/ML ~~LOC~~ SOLN
4.0000 [IU] | Freq: Three times a day (TID) | SUBCUTANEOUS | Status: DC
  Administered 2014-05-21 – 2014-05-22 (×4): 4 [IU] via SUBCUTANEOUS

## 2014-05-21 MED ORDER — MUPIROCIN CALCIUM 2 % EX CREA
TOPICAL_CREAM | Freq: Two times a day (BID) | CUTANEOUS | Status: DC
  Administered 2014-05-21 – 2014-05-22 (×2): via TOPICAL
  Administered 2014-05-22: 1 via TOPICAL
  Administered 2014-05-23: 12:00:00 via TOPICAL
  Filled 2014-05-21 (×2): qty 15

## 2014-05-21 MED ORDER — PIPERACILLIN-TAZOBACTAM 3.375 G IVPB
3.3750 g | Freq: Three times a day (TID) | INTRAVENOUS | Status: DC
  Administered 2014-05-21 – 2014-05-22 (×4): 3.375 g via INTRAVENOUS
  Filled 2014-05-21 (×6): qty 50

## 2014-05-21 MED ORDER — ENOXAPARIN SODIUM 80 MG/0.8ML ~~LOC~~ SOLN
70.0000 mg | SUBCUTANEOUS | Status: DC
  Administered 2014-05-21 – 2014-05-23 (×3): 70 mg via SUBCUTANEOUS
  Filled 2014-05-21 (×3): qty 0.8

## 2014-05-21 MED ORDER — IBUPROFEN 400 MG PO TABS
400.0000 mg | ORAL_TABLET | Freq: Three times a day (TID) | ORAL | Status: DC
  Administered 2014-05-21 – 2014-05-23 (×5): 400 mg via ORAL
  Filled 2014-05-21 (×7): qty 1

## 2014-05-21 MED ORDER — HYDROMORPHONE HCL 1 MG/ML IJ SOLN
1.0000 mg | INTRAMUSCULAR | Status: DC | PRN
  Administered 2014-05-21 – 2014-05-23 (×11): 1 mg via INTRAVENOUS
  Filled 2014-05-21 (×11): qty 1

## 2014-05-21 MED ORDER — LIDOCAINE-EPINEPHRINE 1 %-1:100000 IJ SOLN
30.0000 mL | Freq: Once | INTRAMUSCULAR | Status: DC
  Filled 2014-05-21: qty 30

## 2014-05-21 NOTE — Progress Notes (Signed)
Subjective: Patient states she is in a lot of pain today. She reports her pain medication only gives her relief for 2 hours. She does not feel the swelling around her right eye is improving.   Objective: Vital signs in last 24 hours: Filed Vitals:   05/20/14 0425 05/20/14 1509 05/20/14 2027 05/21/14 0654  BP: 146/86 159/98 113/66 145/87  Pulse: 77 93 83 85  Temp: 97.8 F (36.6 C) 97.6 F (36.4 C) 97.8 F (36.6 C) 97.5 F (36.4 C)  TempSrc:  Oral Oral Oral  Resp: 16 16 16 18   Height:      Weight:      SpO2: 99% 100% 100% 100%   Weight change:   Intake/Output Summary (Last 24 hours) at 05/21/14 0956 Last data filed at 05/20/14 1730  Gross per 24 hour  Intake    480 ml  Output      0 ml  Net    480 ml   Physical Exam  General: alert, appears uncomfortable, sitting up in bed HEENT: Swelling of right temple with apparent fluctuance, but no drainage. Warm to touch. Swelling extends to R eyelid and to bottom of R ear as well as to the middle of the right neck.Swelling appears mildly improved as her right eye is more open compared to yesterday. There is a large patch of dry skin extending from her right ear towards her right eye. No lymphadenopathy.  CV: RRR, normal S1/S2, no m/g/r Pulm: CTA bilaterally, breaths non-labored Abd: BS+, soft, obese, non-tender Ext: warm, no edema, moves all Neuro: alert and oriented x 3, no cranial nerve deficits  Lab Results: Basic Metabolic Panel:  Recent Labs Lab 05/20/14 0500 05/21/14 0616  NA 133* 134*  K 4.4 4.6  CL 100 100  CO2 26 24  GLUCOSE 429* 293*  BUN 16 13  CREATININE 0.85 0.80  CALCIUM 8.7 8.8   Liver Function Tests:  Recent Labs Lab 05/19/14 1517  AST 13  ALT 12  ALKPHOS 91  BILITOT 0.1*  PROT 6.9  ALBUMIN 3.0*   CBC:  Recent Labs Lab 05/19/14 1517 05/20/14 0500  WBC 7.5 6.2  NEUTROABS 4.3  --   HGB 10.3* 10.2*  HCT 32.3* 31.5*  MCV 79.2 79.9  PLT 227 217   CBG:  Recent Labs Lab  05/19/14 2203 05/20/14 0645 05/20/14 1123 05/20/14 1638 05/20/14 2147 05/21/14 0652  GLUCAP 308* 340* 260* 254* 315* 272*   Hemoglobin A1C:  Recent Labs Lab 05/20/14 0500  HGBA1C 12.4*   Thyroid Function Tests:  Recent Labs Lab 05/20/14 0500  TSH 7.979*   Urinalysis:  Recent Labs Lab 05/19/14 1738  COLORURINE YELLOW  LABSPEC 1.045*  PHURINE 5.5  GLUCOSEU >1000*  HGBUR MODERATE*  BILIRUBINUR NEGATIVE  KETONESUR NEGATIVE  PROTEINUR 100*  UROBILINOGEN 0.2  NITRITE NEGATIVE  LEUKOCYTESUR NEGATIVE   Micro Results: Recent Results (from the past 240 hour(s))  MRSA PCR Screening     Status: None   Collection Time: 05/19/14 11:22 PM  Result Value Ref Range Status   MRSA by PCR NEGATIVE NEGATIVE Final    Comment:        The GeneXpert MRSA Assay (FDA approved for NASAL specimens only), is one component of a comprehensive MRSA colonization surveillance program. It is not intended to diagnose MRSA infection nor to guide or monitor treatment for MRSA infections.    Studies/Results: Ct Maxillofacial W/cm  05/19/2014   CLINICAL DATA:  RIGHT facial swelling for 2 days, history of RIGHT  facial abscess. No difficulty breathing.  EXAM: CT MAXILLOFACIAL WITH CONTRAST  TECHNIQUE: Multidetector CT imaging of the maxillofacial structures was performed with intravenous contrast. Multiplanar CT image reconstructions were also generated. A small metallic BB was placed on the right temple in order to reliably differentiate right from left.  CONTRAST:  157mL OMNIPAQUE IOHEXOL 300 MG/ML  SOLN  COMPARISON:  CT maxillofacial January 03, 2013  FINDINGS: RIGHT facial subcutaneous fat stranding, trace lentiform effusion best seen on axial 78/90 without rim enhancing fluid collections, subcutaneous gas or radiopaque foreign bodies.  RIGHT jugulodigastric lymphadenopathy; 11 mm short axis RIGHT level IIb lymph node.  Normal appearance of the major salivary glands. Normal included thyroid  gland. Normal appearance of visualized or digestive tract.  Normal appearance of the ocular globes and orbital contents. RIGHT periorbital soft tissue swelling. Multiple subcentimeter mucosal retention cyst, ethmoid mucosal thickening, no paranasal sinus air-fluid levels. Nasal septum is midline. Bilateral concha bullosa, partially obscured with soft tissue on the LEFT. No destructive bony lesions. Normal visualized intracranial contents.  IMPRESSION: Extensive RIGHT facial/periorbital cellulitis, trace effusion without abscess. No postseptal extent.  RIGHT neck lymphadenopathy is likely reactive.   Electronically Signed   By: Elon Alas   On: 05/19/2014 17:23   Medications: I have reviewed the patient's current medications. Scheduled Meds: . enoxaparin (LOVENOX) injection  40 mg Subcutaneous Q24H  . gabapentin  300 mg Oral q morning - 10a  . gabapentin  600 mg Oral QHS  . hydrochlorothiazide  25 mg Oral Daily  . insulin aspart  0-20 Units Subcutaneous TID WC  . insulin aspart  4 Units Subcutaneous TID WC  . insulin glargine  15 Units Subcutaneous QHS  . irbesartan  150 mg Oral Daily  . levothyroxine  25 mcg Oral QAC breakfast  . linezolid  600 mg Intravenous Q12H  . rosuvastatin  10 mg Oral QHS   Continuous Infusions:  PRN Meds:.acetaminophen **OR** acetaminophen, diphenhydrAMINE, HYDROmorphone (DILAUDID) injection Assessment/Plan: Principal Problem:   Preseptal cellulitis of right eye Active Problems:   Hyperlipidemia   Essential hypertension   Anxiety and depression   Diabetic peripheral neuropathy   DM (diabetes mellitus), type 2, uncontrolled   Hypothyroidism  Pre-septal Cellulitis: No signs or symptoms of orbital cellulitis. CT also negative. She has been afebrile and no leukocytosis. Patient does not feel swelling has improved much, but objectively it appears that her facial swelling has decreased somewhat. She is still having a lot of pain as well. Will adjust pain  regimen. MRSA negative on nose screen. HIV negative.  - Continue Linezolid 600 mg IV Q12H - Stop Morphine, switch to Dilaudid 1 mg Q4H PRN - Benadryl 25 mg Q6H PRN itching  Uncontrolled Type 2 DM with peripheral neuropathy: Last A1c 8.6 on 02/03/14. Reports being taken off of metformin, so she has not been taking it. Repeat HbA1c very elevated at 12.4. Blood sugars are elevated in 250s-310s. Meal time coverage added per diabetes coordinator recs.  - CBGs 4 times daily - Continue holding Metformin, may need to resume upon discharge - Continue home Lantus 15 units QHS - Resistant ISS - Added Novolog 4 units TID with meals, may need to increase if blood sugars continue to be elevated   - Continue home Gabapentin  HTN: BP in [id number] systolic. May have mildly elevated pressures 2/2 pain.  - Continue home HCTZ 25 mg daily and Benicar 20 mg daily  HLD: - Continue home Crestor 10 mg daily  Hypothyroidism: Last TSH 4.670 on  02/03/14. She takes Synthroid 25 mcg daily at home. - Continue home Synthroid - Repeat TSH 7.979, could be elevated 2/2 acute illness. Will need repeat TSH as outpatient.   Anxiety and Bipolar Disorder - Continue home Buspar and Prozac  Diet: Carb modified  VTE PPx: Lovenox SQ Dispo: Disposition is deferred at this time, awaiting improvement of current medical problems.    The patient does have a current PCP Jacques Earthly, MD) and does need an [facility] hospital follow-up appointment after discharge.  The patient does not have transportation limitations that hinder transportation to clinic appointments.  .Services Needed at time of discharge: Y = Yes, Blank = No PT:   OT:   RN:   Equipment:   Other:     LOS: 2 days   Albin Felling, MD 05/21/2014, 9:56 AM

## 2014-05-21 NOTE — Consult Note (Signed)
[name] [name], [name] [name] 38 y.o., female [id number]     Chief Complaint: RIGHT pre auricular swelling  HPI: 38 yo bf, DM, developed RIGHT preauricular swelling 8-9 days ago, "like a pimple". She has been picking and squeezing.Some drainage.  Facial edema and cellulitis with closure of the RIGHT eye.  DM fair to poor control.  CT scan 2 days ago showed fat stranding and subcutaneous thickening without gross fluid loculations or rim enhancement.  Facial swelling slowly improving on antibiotics, but preauricular pain and swelling responding minimally.  Has had multiple prior cutaneous abscesses on various parts of her body, typically + for MRSA.  Culture this time reportedly - for MRSA.    PMH: Past Medical History  Diagnosis Date  . Hyperlipidemia   . Hypertension   . Abscess     history of multiple abscesses  . Obesity   . Peripheral neuropathy   . Abnormal Pap smear of cervix 2009  . Endocarditis 2002    subacute bacterial endocarditis.   . Edema of lower extremity   . Family history of anesthesia complication     "my mom has a hard time coming out from under"  . Heart murmur   . Chronic bronchitis     "get it q yr" (05/13/2013)  . OSA on CPAP   . Hypothyroidism   . Type II diabetes mellitus   . Anemia of chronic disease 2002  . History of blood transfusion     "just low blood count" (05/13/2013)  . GERD (gastroesophageal reflux disease)   . Chronic pain   . Anxiety   . Depression   . Bipolar disorder   . CHF (congestive heart failure)     pt denies this hx on 05/13/2013  . Hypothyroidism, adult 03/21/2014  . Cellulitis 05/21/2014    right eye    Surg Hx: Past Surgical History  Procedure Laterality Date  . Incision and drainage abscess      multiple I&Ds  . Irrigation and debridement abscess Left 07/06/2012    Procedure: IRRIGATION AND DEBRIDEMENT ABSCESS BUTTOCKS AND THIGH;  Surgeon: Shann Medal, MD;  Location: [place];  Service: General;  Laterality: Left;  . Incision  and drainage of wound Left 07/07/2012    Procedure: IRRIGATION AND DEBRIDEMENT WOUND;  Surgeon: Harl Bowie, MD;  Location: [place] [place];  Service: General;  Laterality: Left;  . Incision and drainage abscess Left 07/09/2012    Procedure: DRESSING CHANGE, THIGH WOUND;  Surgeon: Harl Bowie, MD;  Location: [place];  Service: General;  Laterality: Left;  . Incision and drainage perirectal abscess Left 07/14/2012    Procedure: DEBRIDEMENT OF SKIN & SOFT TISSUE; DRESSING CHANGE UNDER ANESTHESIA;  Surgeon: Gayland Curry, MD,FACS;  Location: [place];  Service: General;  Laterality: Left;  . Incision and drainage perirectal abscess Left 07/16/2012    Procedure: I&D Left Thigh;  Surgeon: Gwenyth Ober, MD;  Location: [place];  Service: General;  Laterality: Left;  . Irrigation and debridement abscess Left 08/10/2012    Procedure: IRRIGATION AND DEBRIDEMENT ABSCESS;  Surgeon: Madilyn Hook, DO;  Location: [place];  Service: General;  Laterality: Left;  . Tonsillectomy  1994    FHx:   Family History  Problem Relation Age of Onset  . Heart failure Mother   . Diabetes Mother   . Kidney disease Mother   . Kidney disease Father   . Diabetes Paternal Grandmother   . Heart failure Paternal Grandmother    SocHx:  reports  that she has been smoking Cigarettes.  She has a 1.5 pack-year smoking history. She has never used smokeless tobacco. She reports that she does not drink alcohol or use illicit drugs.  ALLERGIES:  Allergies  Allergen Reactions  . Vancomycin     Acute renal failure suspected secondary to vanco    Medications Prior to Admission  Medication Sig Dispense Refill  . Blood Glucose Monitoring Suppl (BLOOD GLUCOSE METER) kit Check blood sugar up to 3 times a day dx code 250.02 insulin requiring 1 each 0  . FLUoxetine (PROZAC) 20 MG tablet Take 1 tablet (20 mg total) by mouth daily. 30 tablet 3  . glucose blood (WAVESENSE PRESTO) test strip Check blood sugar up to 3 times a day dx code 250.02  insulin requiring 100 each 12  . hydrochlorothiazide (HYDRODIURIL) 25 MG tablet Take 1 tablet (25 mg total) by mouth daily. 30 tablet 2  . insulin aspart (NOVOLOG) 100 UNIT/ML injection Inject 5 Units into the skin 3 (three) times daily with meals. 10 mL 12  . insulin glargine (LANTUS) 100 UNIT/ML injection Inject 0.15 mLs (15 Units total) into the skin at bedtime. 10 mL 12  . Lancets 30G MISC Check blood sugar up to 3 times a day dx code 250.02 insulin requiring 100 each 5  . levothyroxine (SYNTHROID, LEVOTHROID) 25 MCG tablet Take 1 tablet (25 mcg total) by mouth daily before breakfast. 30 tablet 0  . metFORMIN (GLUCOPHAGE) 1000 MG tablet Take 1 tablet (1,000 mg total) by mouth 2 (two) times daily with a meal. 60 tablet 3  . olmesartan (BENICAR) 20 MG tablet Take 1 tablet (20 mg total) by mouth daily. 30 tablet 12  . rosuvastatin (CRESTOR) 10 MG tablet Take 1 tablet (10 mg total) by mouth at bedtime. 30 tablet 11  . busPIRone (BUSPAR) 10 MG tablet Take 1 tablet (10 mg total) by mouth 2 (two) times daily. 60 tablet 0  . gabapentin (NEURONTIN) 300 MG capsule Take 300 mg in the morning and lunch and 600 mg at night 120 capsule 1    Results for orders placed or performed during the hospital encounter of 05/19/14 (from the past 48 hour(s))  Glucose, capillary     Status: Abnormal   Collection Time: 05/19/14 10:03 PM  Result Value Ref Range   Glucose-Capillary 308 (H) 70 - 99 mg/dL   Comment 1 Notify RN   MRSA PCR Screening     Status: None   Collection Time: 05/19/14 11:22 PM  Result Value Ref Range   MRSA by PCR NEGATIVE NEGATIVE    Comment:        The GeneXpert MRSA Assay (FDA approved for NASAL specimens only), is one component of a comprehensive MRSA colonization surveillance program. It is not intended to diagnose MRSA infection nor to guide or monitor treatment for MRSA infections.   Basic metabolic panel     Status: Abnormal   Collection Time: 05/20/14  5:00 AM  Result Value  Ref Range   Sodium 133 (L) 135 - 145 mmol/L    Comment: Please note change in reference range.   Potassium 4.4 3.5 - 5.1 mmol/L    Comment: Please note change in reference range.   Chloride 100 96 - 112 mEq/L   CO2 26 19 - 32 mmol/L   Glucose, Bld 429 (H) 70 - 99 mg/dL   BUN 16 6 - 23 mg/dL   Creatinine, Ser 0.85 0.50 - 1.10 mg/dL   Calcium 8.7 8.4 - 10.5  mg/dL   GFR calc non Af Amer 86 (L) >90 mL/min   GFR calc Af Amer >90 >90 mL/min    Comment: (NOTE) The eGFR has been calculated using the CKD EPI equation. This calculation has not been validated in all clinical situations. eGFR's persistently <90 mL/min signify possible Chronic Kidney Disease.    Anion gap 7 5 - 15  CBC     Status: Abnormal   Collection Time: 05/20/14  5:00 AM  Result Value Ref Range   WBC 6.2 4.0 - 10.5 K/uL   RBC 3.94 3.87 - 5.11 MIL/uL   Hemoglobin 10.2 (L) 12.0 - 15.0 g/dL   HCT 31.5 (L) 36.0 - 46.0 %   MCV 79.9 78.0 - 100.0 fL   MCH 25.9 (L) 26.0 - 34.0 pg   MCHC 32.4 30.0 - 36.0 g/dL   RDW 13.3 11.5 - 15.5 %   Platelets 217 150 - 400 K/uL  Hemoglobin A1c     Status: Abnormal   Collection Time: 05/20/14  5:00 AM  Result Value Ref Range   Hgb A1c MFr Bld 12.4 (H) <5.7 %    Comment: (NOTE)                                                                       According to the ADA Clinical Practice Recommendations for 2011, when HbA1c is used as a screening test:  >=6.5%   Diagnostic of Diabetes Mellitus           (if abnormal result is confirmed) 5.7-6.4%   Increased risk of developing Diabetes Mellitus References:Diagnosis and Classification of Diabetes Mellitus,Diabetes HQPR,9163,84(YKZLD 1):S62-S69 and Standards of Medical Care in         Diabetes - 2011,Diabetes Care,2011,34 (Suppl 1):S11-S61.    Mean Plasma Glucose 309 (H) <117 mg/dL    Comment: Performed at [facility]  TSH     Status: Abnormal   Collection Time: 05/20/14  5:00 AM  Result Value Ref Range   TSH 7.979 (H) 0.350 -  4.500 uIU/mL  HIV antibody     Status: None   Collection Time: 05/20/14  5:00 AM  Result Value Ref Range   HIV 1&2 Ab, 4th Generation NONREACTIVE NONREACTIVE    Comment: (NOTE) A NONREACTIVE HIV Ag/Ab result does not exclude HIV infection since the time frame for seroconversion is variable. If acute HIV infection is suspected, a HIV-1 RNA Qualitative TMA test is recommended. HIV-1/2 Antibody Diff         Not indicated. HIV-1 RNA, Qual TMA           Not indicated. PLEASE NOTE: This information has been disclosed to you from records whose confidentiality may be protected by state law. If your state requires such protection, then the state law prohibits you from making any further disclosure of the information without the specific written consent of the person to whom it pertains, or as otherwise permitted by law. A general authorization for the release of medical or other information is NOT sufficient for this purpose. The performance of this assay has not been clinically validated in patients less than 80 years old. Performed at [facility]   Glucose, capillary     Status: Abnormal   Collection  Time: 05/20/14  6:45 AM  Result Value Ref Range   Glucose-Capillary 340 (H) 70 - 99 mg/dL   Comment 1 Notify RN   Glucose, capillary     Status: Abnormal   Collection Time: 05/20/14 11:23 AM  Result Value Ref Range   Glucose-Capillary 260 (H) 70 - 99 mg/dL  Glucose, capillary     Status: Abnormal   Collection Time: 05/20/14  4:38 PM  Result Value Ref Range   Glucose-Capillary 254 (H) 70 - 99 mg/dL  Glucose, capillary     Status: Abnormal   Collection Time: 05/20/14  9:47 PM  Result Value Ref Range   Glucose-Capillary 315 (H) 70 - 99 mg/dL  Basic metabolic panel     Status: Abnormal   Collection Time: 05/21/14  6:16 AM  Result Value Ref Range   Sodium 134 (L) 135 - 145 mmol/L    Comment: Please note change in reference range.   Potassium 4.6 3.5 - 5.1 mmol/L    Comment:  Please note change in reference range.   Chloride 100 96 - 112 mEq/L   CO2 24 19 - 32 mmol/L   Glucose, Bld 293 (H) 70 - 99 mg/dL   BUN 13 6 - 23 mg/dL   Creatinine, Ser 0.80 0.50 - 1.10 mg/dL   Calcium 8.8 8.4 - 10.5 mg/dL   GFR calc non Af Amer >90 >90 mL/min   GFR calc Af Amer >90 >90 mL/min    Comment: (NOTE) The eGFR has been calculated using the CKD EPI equation. This calculation has not been validated in all clinical situations. eGFR's persistently <90 mL/min signify possible Chronic Kidney Disease.    Anion gap 10 5 - 15  Glucose, capillary     Status: Abnormal   Collection Time: 05/21/14  6:52 AM  Result Value Ref Range   Glucose-Capillary 272 (H) 70 - 99 mg/dL  Glucose, capillary     Status: Abnormal   Collection Time: 05/21/14 11:43 AM  Result Value Ref Range   Glucose-Capillary 219 (H) 70 - 99 mg/dL  Glucose, capillary     Status: Abnormal   Collection Time: 05/21/14  1:18 PM  Result Value Ref Range   Glucose-Capillary 232 (H) 70 - 99 mg/dL  Glucose, capillary     Status: Abnormal   Collection Time: 05/21/14  4:32 PM  Result Value Ref Range   Glucose-Capillary 141 (H) 70 - 99 mg/dL   US Soft Tissue Head/neck  05/21/2014   CLINICAL DATA:  Swelling of the right lateral face and head. Patient with history of diabetes and multiple abscesses.  EXAM: ULTRASOUND OF HEAD/NECK SOFT TISSUES  TECHNIQUE: Ultrasound examination of the head and neck soft tissues was performed in the area of clinical concern.  COMPARISON:  Contrast-enhanced maxillofacial CT -05/19/2014  FINDINGS: There is marked edematous soft tissue swelling about the right lateral face.  Note is made of 2 hypoechoic well-defined lobular structures, measuring 0.7 x 1.3 x 0.7 cm (images 15-17) and 1.0 x 0.6 x 0.8 cm (images 21 through 23), the smaller which is noted to have a vessel running adjacent to it (images 25 and 26).  Note is made of an approximately 1.2 x 0.7 x 0.9 cm hypoechoic though mixed echogenic  slightly serpiginous nodule/fluid collection within the subcutaneous tissues of the right lateral face (images 28 through 38).  IMPRESSION: 1. Marked soft tissue swelling about the right side of the face compatible with cellulitis as demonstrated on recently performed contrast-enhanced maxillofacial CT. 2. Hypoechoic  well defined lobular structures within the adjacent soft tissues are favored to represent reactive lymph nodes. 3. Indeterminate approximately 1.2 cm minimally complex nodule/fluid collection within the adjacent subcutaneous tissues of the right lateral face - while potentially representative of a less well-defined reactive lymph node, a tiny developing fluid collection / abscess could have a similar appearance. Note, this structure is likely too small for percutaneous sampling and would recommend repeat imaging (preferably with a contrast-enhanced maxillofacial CT) only in the setting of failed conservative management.   Electronically Signed   By: Sandi Mariscal M.D.   On: 05/21/2014 12:19      Blood pressure 142/78, pulse 85, temperature 97.8 F (36.6 C), temperature source Oral, resp. rate 18, height _0  (1.778 m), weight 146.512 kg (323 lb), last menstrual period 03/10/2014, SpO2 98 %.  PHYSICAL EXAM: Overall appearance:  She is anxious and somewhat distressed.  Multiple healed hyperpigmented scars.   Head: 3-4 cm tender lenticular swelling, RIGHT preauricular with some superficial draining tracts, quite tender.  Mod right sided facial swelling, but eye does open. Ears:  Not examined Nose:  Not examined Oral Cavity: not examined Oral Pharynx/Hypopharynx/Larynx:  Not examined Neuro:  Grossly intact Neck:  Swelling RIGHT  Studies Reviewed: CT maxillofacial    Assessment/Plan RIGHT pre-auricular carbuncle.  This explains the soft tissue thickening without gross loculations or rim enhancement.    With informed consent, I anesthetized the RIGHT preauricular area with 1%  xylocaine with 1:100,000 epinephrine, 10 ml total.  1 mg of IV Dilaudid was used as a pre medication.  After ascertaining adequate anesthesia, a 1 cm vertical incision was made into the center of the swelling.  No gross purulent loculation was encountered.  Culture was taken.  The area was explored bluntly but vigorously with a  Hemostat and multiple small loculation were broken up and evacuated.  Hemostasis was spontaneous.  The cavity was packed with 1/4" iodoform Nu Gauze.  She tolerated this well.  Mupirocin ointment will be used over the wound, and an absorbent dressing applied.  I would like to see her back in my office Monday and will change out the wound packing.  She understands and agrees.  Jodi Marble 00/92/3300, 9:11 PM

## 2014-05-21 NOTE — Progress Notes (Signed)
ANTIBIOTIC CONSULT NOTE - INITIAL  Pharmacy Consult for Zosyn Indication: Cellulitis   Allergies  Allergen Reactions  . Vancomycin     Acute renal failure suspected secondary to vanco    Patient Measurements: Height: 5\' 10"  (177.8 cm) Weight: (!) 323 lb (146.512 kg) IBW/kg (Calculated) : 68.5 Adjusted Body Weight:   Vital Signs: Temp: 97.5 F (36.4 C) (12/30 0654) Temp Source: Oral (12/30 0654) BP: 145/87 mmHg (12/30 0654) Pulse Rate: 85 (12/30 0654) Intake/Output from previous day: 12/29 0701 - 12/30 0700 In: 720 [P.O.:720] Out: -  Intake/Output from this shift:    Labs:  Recent Labs  05/19/14 1517 05/20/14 0500 05/21/14 0616  WBC 7.5 6.2  --   HGB 10.3* 10.2*  --   PLT 227 217  --   CREATININE 0.86 0.85 0.80   Estimated Creatinine Clearance: 151.5 mL/min (by C-G formula based on Cr of 0.8). No results for input(s): VANCOTROUGH, VANCOPEAK, VANCORANDOM, GENTTROUGH, GENTPEAK, GENTRANDOM, TOBRATROUGH, TOBRAPEAK, TOBRARND, AMIKACINPEAK, AMIKACINTROU, AMIKACIN in the last 72 hours.   Microbiology: Recent Results (from the past 720 hour(s))  MRSA PCR Screening     Status: None   Collection Time: 05/19/14 11:22 PM  Result Value Ref Range Status   MRSA by PCR NEGATIVE NEGATIVE Final    Comment:        The GeneXpert MRSA Assay (FDA approved for NASAL specimens only), is one component of a comprehensive MRSA colonization surveillance program. It is not intended to diagnose MRSA infection nor to guide or monitor treatment for MRSA infections.     Medical History: Past Medical History  Diagnosis Date  . Hyperlipidemia   . Hypertension   . Abscess     history of multiple abscesses  . Obesity   . Peripheral neuropathy   . Abnormal Pap smear of cervix 2009  . Endocarditis 2002    subacute bacterial endocarditis.   . Edema of lower extremity   . Family history of anesthesia complication     "my mom has a hard time coming out from under"  . Heart murmur    . Chronic bronchitis     "get it q yr" (05/13/2013)  . OSA on CPAP   . Hypothyroidism   . Type II diabetes mellitus   . Anemia of chronic disease 2002  . History of blood transfusion     "just low blood count" (05/13/2013)  . GERD (gastroesophageal reflux disease)   . Chronic pain   . Anxiety   . Depression   . Bipolar disorder   . CHF (congestive heart failure)     pt denies this hx on 05/13/2013  . Hypothyroidism, adult 03/21/2014   Assessment: R sided facial swelling with pus drainage. Originated from a pimple-like lesion after having her hair braided several days ago.  Anticoagulation: Lovenox 40mg /day>>70mg /24h for BMI>30 (0.5mg /kg/24h). Hgb 10.2 (baseline for her).  Infectious Disease: Facial cellulitis. Not improving much on Zyvox (12/28) (Vanco allergy). Warm and swollen but no drainage noted by MD. Adding Zosyn (12/30). No cultures  Cardiovascular: HTN, HLD, on HCTZ, Avapro, Cresotr  Endocrinology: DM with A1C 12.4, hypothyroid on Synthroid, SSI, Lantus  Gastrointestinal / Nutrition:   Neurology: Bipolar dz, anxiety (Prozac and Buspar on hold while on Zyvox due to serotonin syndrome), Gabapentin  Nephrology: Scr 0.8. CrCl>100  PTA Medication Issues: (Buspar and Prozac--holding), metformin (pt tells MD she not taking it).  Goal of Therapy:  Vancomycin trough level 10-15 mcg/ml  Plan:  Zyvox 600mg  IV q12h (Vanco allergy) for  purulent severe cellulitis. Zosyn 3.375g IV q8hr. Increase LMWH to 0.5mg /kg/day for BMI>30 to 70mg  SQ daily.  [name] [name], PharmD, [facility] Clinical Staff Pharmacist Pager [phone]  [place], [place] 05/21/2014,11:03 AM

## 2014-05-21 NOTE — Progress Notes (Signed)
05/22/14 Spoke with patient and obtained consent to contact Zyvox assistance proram.Contacted zyvox assistance program and received approval for 30 days of zyvox with no copay. Informed Dr. Arcelia Jew and patient. Zyvox to fax approval form to me. Will continue to follow for d/c needs. Fuller Plan RN, BSN, CCM

## 2014-05-22 DIAGNOSIS — L0203 Carbuncle of face: Secondary | ICD-10-CM

## 2014-05-22 LAB — GLUCOSE, CAPILLARY
Glucose-Capillary: 221 mg/dL — ABNORMAL HIGH (ref 70–99)
Glucose-Capillary: 247 mg/dL — ABNORMAL HIGH (ref 70–99)
Glucose-Capillary: 213 mg/dL — ABNORMAL HIGH (ref 70–99)
Glucose-Capillary: 223 mg/dL — ABNORMAL HIGH (ref 70–99)
Glucose-Capillary: 105 mg/dL — ABNORMAL HIGH (ref 70–99)

## 2014-05-22 LAB — CBC
HCT: 33.2 % — ABNORMAL LOW (ref 36.0–46.0)
Hemoglobin: 10.6 g/dL — ABNORMAL LOW (ref 12.0–15.0)
MCH: 25.4 pg — ABNORMAL LOW (ref 26.0–34.0)
MCHC: 31.9 g/dL (ref 30.0–36.0)
MCV: 79.4 fL (ref 78.0–100.0)
PLATELETS: 237 10*3/uL (ref 150–400)
RBC: 4.18 MIL/uL (ref 3.87–5.11)
RDW: 13 % (ref 11.5–15.5)
WBC: 6.3 10*3/uL (ref 4.0–10.5)

## 2014-05-22 LAB — BASIC METABOLIC PANEL
Anion gap: 7 (ref 5–15)
BUN: 15 mg/dL (ref 6–23)
CALCIUM: 9.4 mg/dL (ref 8.4–10.5)
CO2: 28 mmol/L (ref 19–32)
Chloride: 102 mEq/L (ref 96–112)
Creatinine, Ser: 0.94 mg/dL (ref 0.50–1.10)
GFR calc Af Amer: 89 mL/min — ABNORMAL LOW (ref >90)
GFR calc non Af Amer: 77 mL/min — ABNORMAL LOW (ref >90)
Glucose, Bld: 269 mg/dL — ABNORMAL HIGH (ref 70–99)
POTASSIUM: 4.3 mmol/L (ref 3.5–5.1)
SODIUM: 137 mmol/L (ref 135–145)

## 2014-05-22 LAB — PREGNANCY, URINE: Preg Test, Ur: NEGATIVE

## 2014-05-22 MED ORDER — INSULIN GLARGINE 100 UNIT/ML ~~LOC~~ SOLN
20.0000 [IU] | Freq: Every day | SUBCUTANEOUS | Status: DC
  Administered 2014-05-22: 20 [IU] via SUBCUTANEOUS
  Filled 2014-05-22 (×2): qty 0.2

## 2014-05-22 MED ORDER — INSULIN ASPART 100 UNIT/ML ~~LOC~~ SOLN
5.0000 [IU] | Freq: Three times a day (TID) | SUBCUTANEOUS | Status: DC
  Administered 2014-05-22 – 2014-05-23 (×3): 5 [IU] via SUBCUTANEOUS

## 2014-05-22 MED ORDER — INSULIN ASPART 100 UNIT/ML ~~LOC~~ SOLN: 6.0000 [IU] | Freq: Three times a day (TID) | SUBCUTANEOUS | Status: DC

## 2014-05-22 MED ORDER — CEPHALEXIN 500 MG PO CAPS
500.0000 mg | ORAL_CAPSULE | Freq: Four times a day (QID) | ORAL | Status: DC
  Administered 2014-05-22 – 2014-05-23 (×4): 500 mg via ORAL
  Filled 2014-05-22 (×7): qty 1

## 2014-05-22 MED ORDER — DOXYCYCLINE HYCLATE 100 MG PO TABS
100.0000 mg | ORAL_TABLET | Freq: Two times a day (BID) | ORAL | Status: DC
  Administered 2014-05-22 – 2014-05-23 (×2): 100 mg via ORAL
  Filled 2014-05-22 (×3): qty 1

## 2014-05-22 NOTE — Progress Notes (Signed)
Subjective: ENT evaluated patient yesterday and drained right pre-auricular carbuncle. No gross pus. Loculation was broken up. Patient reports she had immediate relief after the procedure. She notes the swelling around her eye has significantly improved. She notes she is still having pain but it is well controlled.   Objective: Vital signs in last 24 hours: Filed Vitals:   05/21/14 0654 05/21/14 1641 05/21/14 2011 05/22/14 0513  BP: 145/87 136/78 142/78 106/75  Pulse: 85 83 85 86  Temp: 97.5 F (36.4 C) 97.7 F (36.5 C) 97.8 F (36.6 C) 97.8 F (36.6 C)  TempSrc: Oral Oral    Resp: 18 18 18 18   Height:      Weight:      SpO2: 100% 97% 98% 100%   Weight change:   Intake/Output Summary (Last 24 hours) at 05/22/14 1044 Last data filed at 05/22/14 0912  Gross per 24 hour  Intake    590 ml  Output      0 ml  Net    590 ml   Physical Exam General: alert, sitting up in chair, NAD HEENT: Right temple covered with clean gauze. There is mild swelling around the right eye, but much improved compared to yesterday. Tenderness elicited upon palpation to area around right eye, cheek and around right ear. No lymphadenopathy.   CV: RRR, normal S1/S2, no m/g/r Pulm: CTA bilaterally, breaths non-labored Abd: BS+, soft, obese, non-tender Ext: warm, no edema, moves all Neuro: alert and oriented x 3, no cranial nerve deficits  Lab Results: Basic Metabolic Panel:  Recent Labs Lab 05/21/14 0616 05/22/14 0525  NA 134* 137  K 4.6 4.3  CL 100 102  CO2 24 28  GLUCOSE 293* 269*  BUN 13 15  CREATININE 0.80 0.94  CALCIUM 8.8 9.4   Liver Function Tests:  Recent Labs Lab 05/19/14 1517  AST 13  ALT 12  ALKPHOS 91  BILITOT 0.1*  PROT 6.9  ALBUMIN 3.0*   CBC:  Recent Labs Lab 05/19/14 1517 05/20/14 0500 05/22/14 0525  WBC 7.5 6.2 6.3  NEUTROABS 4.3  --   --   HGB 10.3* 10.2* 10.6*  HCT 32.3* 31.5* 33.2*  MCV 79.2 79.9 79.4  PLT 227 217 237   CBG:  Recent Labs Lab  05/21/14 1143 05/21/14 1318 05/21/14 1632 05/21/14 2126 05/22/14 0137 05/22/14 0627  GLUCAP 219* 232* 141* 224* 221* 247*   Hemoglobin A1C:  Recent Labs Lab 05/20/14 0500  HGBA1C 12.4*   Thyroid Function Tests:  Recent Labs Lab 05/20/14 0500  TSH 7.979*    Recent Labs Lab 05/19/14 1738  COLORURINE YELLOW  LABSPEC 1.045*  PHURINE 5.5  GLUCOSEU >1000*  HGBUR MODERATE*  BILIRUBINUR NEGATIVE  KETONESUR NEGATIVE  PROTEINUR 100*  UROBILINOGEN 0.2  NITRITE NEGATIVE  LEUKOCYTESUR NEGATIVE    Micro Results: Recent Results (from the past 240 hour(s))  MRSA PCR Screening     Status: None   Collection Time: 05/19/14 11:22 PM  Result Value Ref Range Status   MRSA by PCR NEGATIVE NEGATIVE Final    Comment:        The GeneXpert MRSA Assay (FDA approved for NASAL specimens only), is one component of a comprehensive MRSA colonization surveillance program. It is not intended to diagnose MRSA infection nor to guide or monitor treatment for MRSA infections.   Wound culture     Status: None (Preliminary result)   Collection Time: 05/21/14  9:15 PM  Result Value Ref Range Status   Specimen Description CARBUNCLE  Final   Special Requests Immunocompromised  Final   Gram Stain   Final    RARE WBC PRESENT,BOTH PMN AND MONONUCLEAR NO SQUAMOUS EPITHELIAL CELLS SEEN NO ORGANISMS SEEN Performed at [facility]    Culture   Final    NO GROWTH 1 DAY Performed at [facility]    Report Status PENDING  Incomplete   Studies/Results: US Soft Tissue Head/neck  05/21/2014   CLINICAL DATA:  Swelling of the right lateral face and head. Patient with history of diabetes and multiple abscesses.  EXAM: ULTRASOUND OF HEAD/NECK SOFT TISSUES  TECHNIQUE: Ultrasound examination of the head and neck soft tissues was performed in the area of clinical concern.  COMPARISON:  Contrast-enhanced maxillofacial CT -05/19/2014  FINDINGS: There is marked edematous soft tissue  swelling about the right lateral face.  Note is made of 2 hypoechoic well-defined lobular structures, measuring 0.7 x 1.3 x 0.7 cm (images 15-17) and 1.0 x 0.6 x 0.8 cm (images 21 through 23), the smaller which is noted to have a vessel running adjacent to it (images 25 and 26).  Note is made of an approximately 1.2 x 0.7 x 0.9 cm hypoechoic though mixed echogenic slightly serpiginous nodule/fluid collection within the subcutaneous tissues of the right lateral face (images 28 through 38).  IMPRESSION: 1. Marked soft tissue swelling about the right side of the face compatible with cellulitis as demonstrated on recently performed contrast-enhanced maxillofacial CT. 2. Hypoechoic well defined lobular structures within the adjacent soft tissues are favored to represent reactive lymph nodes. 3. Indeterminate approximately 1.2 cm minimally complex nodule/fluid collection within the adjacent subcutaneous tissues of the right lateral face - while potentially representative of a less well-defined reactive lymph node, a tiny developing fluid collection / abscess could have a similar appearance. Note, this structure is likely too small for percutaneous sampling and would recommend repeat imaging (preferably with a contrast-enhanced maxillofacial CT) only in the setting of failed conservative management.   Electronically Signed   By: Sandi Mariscal M.D.   On: 05/21/2014 12:19   Medications: I have reviewed the patient's current medications. Scheduled Meds: . enoxaparin (LOVENOX) injection  70 mg Subcutaneous Q24H  . gabapentin  300 mg Oral q morning - 10a  . gabapentin  600 mg Oral QHS  . hydrochlorothiazide  25 mg Oral Daily  . ibuprofen  400 mg Oral TID  . insulin aspart  0-20 Units Subcutaneous TID WC  . insulin aspart  4 Units Subcutaneous TID WC  . insulin glargine  15 Units Subcutaneous QHS  . irbesartan  150 mg Oral Daily  . levothyroxine  25 mcg Oral QAC breakfast  . lidocaine-EPINEPHrine  30 mL Intradermal  Once  . linezolid  600 mg Intravenous Q12H  . mupirocin cream   Topical BID  . piperacillin-tazobactam (ZOSYN)  IV  3.375 g Intravenous 3 times per day  . rosuvastatin  10 mg Oral QHS   Continuous Infusions:  PRN Meds:.acetaminophen **OR** acetaminophen, diphenhydrAMINE, HYDROmorphone (DILAUDID) injection Assessment/Plan: Principal Problem:   Preseptal cellulitis of right eye Active Problems:   Hyperlipidemia   Essential hypertension   Anxiety and depression   Diabetic peripheral neuropathy   DM (diabetes mellitus), type 2, uncontrolled   Hypothyroidism   Carbuncle of face  Pre-septal Cellulitis: Swelling appears much more improved compared to yesterday. Appreciate ENT consult and draining of pre-auricular carbuncle. Patient noted immediate relief after procedure. Pain well controlled.  - Continue Linezolid 600 mg IV Q12H (start date: 05/19/14) -  Continue Zosyn 3.375 g IV Q8H (start date: 05/21/14) - Continue Dilaudid 1 mg Q4H PRN - Benadryl 25 mg Q6H PRN itching  Uncontrolled Type 2 DM with peripheral neuropathy: Last A1c 8.6 on 02/03/14. Reports being taken off of metformin, so she has not been taking it. Repeat HbA1c very elevated at 12.4. Blood sugars are elevated in 140s-250s.  - CBGs 4 times daily - Continue holding Metformin, may need to resume upon discharge - Continue home Lantus 15 units QHS - Resistant ISS - Continue Novolog 4 units TID with meals, may need to increase if blood sugars continue to be elevated  - Continue home Gabapentin  HTN: BP in [id number] systolic. May have mildly elevated pressures 2/2 pain.  - Continue home HCTZ 25 mg daily and Benicar 20 mg daily  HLD: - Continue home Crestor 10 mg daily  Hypothyroidism: Last TSH 4.670 on 02/03/14. She takes Synthroid 25 mcg daily at home. - Continue home Synthroid - Repeat TSH 7.979, could be elevated 2/2 acute illness. Will need repeat TSH as outpatient.   Anxiety and Bipolar Disorder - Continue home  Buspar and Prozac  Diet: Carb modified  VTE PPx: Lovenox SQ Dispo: Disposition is deferred at this time, awaiting improvement of current medical problems.Anticipated discharge in 1-2 days.   The patient does have a current PCP Jacques Earthly, MD) and does need an [facility] hospital follow-up appointment after discharge.  The patient does not have transportation limitations that hinder transportation to clinic appointments.  .Services Needed at time of discharge: Y = Yes, Blank = No PT:   OT:   RN:   Equipment:   Other:     LOS: 3 days   Albin Felling, MD 05/22/2014, 10:44 AM

## 2014-05-22 NOTE — Progress Notes (Signed)
Dr. Erik Obey at bedside with patient preparing to perform procedure.

## 2014-05-22 NOTE — Progress Notes (Signed)
05/22/14 Received faxed approval for up to 30 days of Zyvox free. Contacted the  Zyvox assistance pharmacy and found that [place] is the only participating pharmacy that will be open this weekend Sat 9-12n. Gave patient approval form and information about [facility]. Verified with Dr. Arcelia Jew that patient will not be on Zyvox more than 30 days and will need 05/23/14 doses prior to d/c. Fuller Plan RN, BSN, CCM      05/22/14 Spoke with patient and obtained consent to contact Zyvox assistance proram.Contacted zyvox assistance program and received approval for 30 days of zyvox with no copay. Informed Dr. Arcelia Jew and patient. Zyvox to fax approval form to me. Will continue to follow for d/c needs. Fuller Plan RN, BSN, CCM

## 2014-05-22 NOTE — Progress Notes (Signed)
05/22/2014 10:50 AM  [name], [name] Mew [id number]  Post-Op Day 1    Temp:  [97.7 F (36.5 C)-97.8 F (36.6 C)] 97.8 F (36.6 C) (12/31 0513) Pulse Rate:  [83-86] 86 (12/31 0513) Resp:  [18] 18 (12/31 0513) BP: (106-142)/(75-78) 106/75 mmHg (12/31 0513) SpO2:  [97 %-100 %] 100 % (12/31 0513),     Intake/Output Summary (Last 24 hours) at 05/22/14 1050 Last data filed at 05/22/14 [id number]  Gross per 24 hour  Intake    590 ml  Output      0 ml  Net    590 ml    Results for orders placed or performed during the hospital encounter of 05/19/14 (from the past 24 hour(s))  Glucose, capillary     Status: Abnormal   Collection Time: 05/21/14 11:43 AM  Result Value Ref Range   Glucose-Capillary 219 (H) 70 - 99 mg/dL  Glucose, capillary     Status: Abnormal   Collection Time: 05/21/14  1:18 PM  Result Value Ref Range   Glucose-Capillary 232 (H) 70 - 99 mg/dL  Glucose, capillary     Status: Abnormal   Collection Time: 05/21/14  4:32 PM  Result Value Ref Range   Glucose-Capillary 141 (H) 70 - 99 mg/dL  Wound culture     Status: None (Preliminary result)   Collection Time: 05/21/14  9:15 PM  Result Value Ref Range   Specimen Description CARBUNCLE    Special Requests Immunocompromised    Gram Stain      RARE WBC PRESENT,BOTH PMN AND MONONUCLEAR NO SQUAMOUS EPITHELIAL CELLS SEEN NO ORGANISMS SEEN Performed at [facility]    Culture      NO GROWTH 1 DAY Performed at [facility]    Report Status PENDING   Glucose, capillary     Status: Abnormal   Collection Time: 05/21/14  9:26 PM  Result Value Ref Range   Glucose-Capillary 224 (H) 70 - 99 mg/dL   Comment 1 Notify RN   Glucose, capillary     Status: Abnormal   Collection Time: 05/22/14  1:37 AM  Result Value Ref Range   Glucose-Capillary 221 (H) 70 - 99 mg/dL   Comment 1 Notify RN   CBC     Status: Abnormal   Collection Time: 05/22/14  5:25 AM  Result Value Ref Range   WBC 6.3 4.0 - 10.5 K/uL   RBC  4.18 3.87 - 5.11 MIL/uL   Hemoglobin 10.6 (L) 12.0 - 15.0 g/dL   HCT 33.2 (L) 36.0 - 46.0 %   MCV 79.4 78.0 - 100.0 fL   MCH 25.4 (L) 26.0 - 34.0 pg   MCHC 31.9 30.0 - 36.0 g/dL   RDW 13.0 11.5 - 15.5 %   Platelets 237 150 - 400 K/uL  Basic metabolic panel     Status: Abnormal   Collection Time: 05/22/14  5:25 AM  Result Value Ref Range   Sodium 137 135 - 145 mmol/L   Potassium 4.3 3.5 - 5.1 mmol/L   Chloride 102 96 - 112 mEq/L   CO2 28 19 - 32 mmol/L   Glucose, Bld 269 (H) 70 - 99 mg/dL   BUN 15 6 - 23 mg/dL   Creatinine, Ser 0.94 0.50 - 1.10 mg/dL   Calcium 9.4 8.4 - 10.5 mg/dL   GFR calc non Af Amer 77 (L) >90 mL/min   GFR calc Af Amer 89 (L) >90 mL/min   Anion gap 7 5 - 15  Glucose, capillary  Status: Abnormal   Collection Time: 05/22/14  6:27 AM  Result Value Ref Range   Glucose-Capillary 247 (H) 70 - 99 mg/dL   Comment 1 Notify RN     SUBJECTIVE:  RIGHT facial swelling improved, pain still strong.  OBJECTIVE:  Less periorbital swelling.  Wound intact with loose dressing in place  IMPRESSION:  Satisfactory check  PLAN:  OK for discharge from my standpoint.  Mupirocin on the surgical wound bid.  Absorbent dressing and change once daily.  Recheck my office Monday please.  Cultures pending.   Jodi Marble

## 2014-05-23 LAB — GLUCOSE, CAPILLARY
Glucose-Capillary: 245 mg/dL — ABNORMAL HIGH (ref 70–99)
Glucose-Capillary: 166 mg/dL — ABNORMAL HIGH (ref 70–99)

## 2014-05-23 MED ORDER — OXYCODONE-ACETAMINOPHEN 5-325 MG PO TABS: 1.0000 | ORAL_TABLET | Freq: Four times a day (QID) | ORAL | Status: DC | PRN

## 2014-05-23 MED ORDER — IBUPROFEN 600 MG PO TABS: 600.0000 mg | ORAL_TABLET | Freq: Three times a day (TID) | ORAL | Status: DC

## 2014-05-23 MED ORDER — DOXYCYCLINE HYCLATE 100 MG PO TABS: 100.0000 mg | ORAL_TABLET | Freq: Two times a day (BID) | ORAL | Status: DC

## 2014-05-23 MED ORDER — HYDROCHLOROTHIAZIDE 25 MG PO TABS: 25.0000 mg | ORAL_TABLET | Freq: Every day | ORAL | Status: DC

## 2014-05-23 MED ORDER — INSULIN GLARGINE 100 UNIT/ML ~~LOC~~ SOLN: 20.0000 [IU] | Freq: Every day | SUBCUTANEOUS | Status: DC

## 2014-05-23 MED ORDER — IBUPROFEN 600 MG PO TABS
600.0000 mg | ORAL_TABLET | Freq: Three times a day (TID) | ORAL | Status: DC
  Filled 2014-05-23 (×2): qty 1

## 2014-05-23 MED ORDER — CEPHALEXIN 500 MG PO CAPS: 500.0000 mg | ORAL_CAPSULE | Freq: Four times a day (QID) | ORAL | Status: DC

## 2014-05-23 MED ORDER — DIPHENHYDRAMINE HCL 25 MG PO CAPS: 25.0000 mg | ORAL_CAPSULE | Freq: Four times a day (QID) | ORAL | Status: DC | PRN

## 2014-05-23 NOTE — Discharge Summary (Signed)
Name: [name] [name] MRN: [id number] DOB: [birth year] 39 y.o. PCP: [name] Earthly, MD  Date of Admission: 05/19/2014  2:31 PM Date of Discharge: 05/23/2014 Attending Physician: [name] Falcon, MD  Discharge Diagnosis: 1.  Principal Problem:   Preseptal cellulitis of right eye Active Problems:   Hyperlipidemia   Essential hypertension   Anxiety and depression   Diabetic peripheral neuropathy   DM (diabetes mellitus), type 2, uncontrolled   Hypothyroidism   Carbuncle of face  Discharge Medications:   Medication List    TAKE these medications        blood glucose meter kit and supplies  Check blood sugar up to 3 times a day dx code 250.02 insulin requiring     busPIRone 10 MG tablet  Commonly known as:  BUSPAR  Take 1 tablet (10 mg total) by mouth 2 (two) times daily.     cephALEXin 500 MG capsule  Commonly known as:  KEFLEX  Take 1 capsule (500 mg total) by mouth every 6 (six) hours.     diphenhydrAMINE 25 mg capsule  Commonly known as:  BENADRYL  Take 1 capsule (25 mg total) by mouth every 6 (six) hours as needed for itching.     doxycycline 100 MG tablet  Commonly known as:  VIBRA-TABS  Take 1 tablet (100 mg total) by mouth every 12 (twelve) hours.     FLUoxetine 20 MG tablet  Commonly known as:  PROZAC  Take 1 tablet (20 mg total) by mouth daily.     gabapentin 300 MG capsule  Commonly known as:  NEURONTIN  Take 300 mg in the morning and lunch and 600 mg at night     glucose blood test strip  Commonly known as:  WAVESENSE PRESTO  Check blood sugar up to 3 times a day dx code 250.02 insulin requiring     hydrochlorothiazide 25 MG tablet  Commonly known as:  HYDRODIURIL  Take 1 tablet (25 mg total) by mouth daily.     hydrochlorothiazide 25 MG tablet  Commonly known as:  HYDRODIURIL  Take 1 tablet (25 mg total) by mouth daily.     ibuprofen 600 MG tablet  Commonly known as:  ADVIL,MOTRIN  Take 1 tablet (600 mg total) by mouth 3 (three) times  daily.     insulin aspart 100 UNIT/ML injection  Commonly known as:  novoLOG  Inject 5 Units into the skin 3 (three) times daily with meals.     insulin glargine 100 UNIT/ML injection  Commonly known as:  LANTUS  Inject 0.2 mLs (20 Units total) into the skin at bedtime.     Lancets 30G Misc  Check blood sugar up to 3 times a day dx code 250.02 insulin requiring     levothyroxine 25 MCG tablet  Commonly known as:  SYNTHROID, LEVOTHROID  Take 1 tablet (25 mcg total) by mouth daily before breakfast.     metFORMIN 1000 MG tablet  Commonly known as:  GLUCOPHAGE  Take 1 tablet (1,000 mg total) by mouth 2 (two) times daily with a meal.     olmesartan 20 MG tablet  Commonly known as:  BENICAR  Take 1 tablet (20 mg total) by mouth daily.     oxyCODONE-acetaminophen 5-325 MG per tablet  Commonly known as:  PERCOCET/ROXICET  Take 1-2 tablets by mouth every 6 (six) hours as needed for moderate pain or severe pain.     rosuvastatin 10 MG tablet  Commonly known as:  CRESTOR  Take  1 tablet (10 mg total) by mouth at bedtime.        Disposition and follow-up:   [name] was discharged from [facility] in Good condition.  At the hospital follow up visit please address:  1.  Pre-septal Cellulitis: Has swelling improved? Did packing get removed by ENT? Patient may need packing removed in clinic. Uncontrolled Diabetes: HbA1c 12.4. Patient had not been taking her Metformin. Metformin restarted. Adjusted Lantus to 20 units QHS.  Hypothyroidism: Elevated TSH. Will need repeat TSH in 4-6 weeks.   2.  Labs / imaging needed at time of follow-up: TSH (4-6 weeks)  3.  Pending labs/ test needing follow-up: None  Follow-up Appointments:   Discharge Instructions: Discharge Instructions    Diet - low sodium heart healthy    Complete by:  As directed      Increase activity slowly    Complete by:  As directed            Consultations:    Procedures  Performed:  US Soft Tissue Head/neck  05/21/2014   CLINICAL DATA:  Swelling of the right lateral face and head. Patient with history of diabetes and multiple abscesses.  EXAM: ULTRASOUND OF HEAD/NECK SOFT TISSUES  TECHNIQUE: Ultrasound examination of the head and neck soft tissues was performed in the area of clinical concern.  COMPARISON:  Contrast-enhanced maxillofacial CT -05/19/2014  FINDINGS: There is marked edematous soft tissue swelling about the right lateral face.  Note is made of 2 hypoechoic well-defined lobular structures, measuring 0.7 x 1.3 x 0.7 cm (images 15-17) and 1.0 x 0.6 x 0.8 cm (images 21 through 23), the smaller which is noted to have a vessel running adjacent to it (images 25 and 26).  Note is made of an approximately 1.2 x 0.7 x 0.9 cm hypoechoic though mixed echogenic slightly serpiginous nodule/fluid collection within the subcutaneous tissues of the right lateral face (images 28 through 38).  IMPRESSION: 1. Marked soft tissue swelling about the right side of the face compatible with cellulitis as demonstrated on recently performed contrast-enhanced maxillofacial CT. 2. Hypoechoic well defined lobular structures within the adjacent soft tissues are favored to represent reactive lymph nodes. 3. Indeterminate approximately 1.2 cm minimally complex nodule/fluid collection within the adjacent subcutaneous tissues of the right lateral face - while potentially representative of a less well-defined reactive lymph node, a tiny developing fluid collection / abscess could have a similar appearance. Note, this structure is likely too small for percutaneous sampling and would recommend repeat imaging (preferably with a contrast-enhanced maxillofacial CT) only in the setting of failed conservative management.   Electronically Signed   By: Sandi Mariscal M.D.   On: 05/21/2014 12:19   Ct Maxillofacial W/cm  05/19/2014   CLINICAL DATA:  RIGHT facial swelling for 2 days, history of RIGHT facial  abscess. No difficulty breathing.  EXAM: CT MAXILLOFACIAL WITH CONTRAST  TECHNIQUE: Multidetector CT imaging of the maxillofacial structures was performed with intravenous contrast. Multiplanar CT image reconstructions were also generated. A small metallic BB was placed on the right temple in order to reliably differentiate right from left.  CONTRAST:  125m OMNIPAQUE IOHEXOL 300 MG/ML  SOLN  COMPARISON:  CT maxillofacial January 03, 2013  FINDINGS: RIGHT facial subcutaneous fat stranding, trace lentiform effusion best seen on axial 78/90 without rim enhancing fluid collections, subcutaneous gas or radiopaque foreign bodies.  RIGHT jugulodigastric lymphadenopathy; 11 mm short axis RIGHT level IIb lymph node.  Normal appearance of the major salivary  glands. Normal included thyroid gland. Normal appearance of visualized or digestive tract.  Normal appearance of the ocular globes and orbital contents. RIGHT periorbital soft tissue swelling. Multiple subcentimeter mucosal retention cyst, ethmoid mucosal thickening, no paranasal sinus air-fluid levels. Nasal septum is midline. Bilateral concha bullosa, partially obscured with soft tissue on the LEFT. No destructive bony lesions. Normal visualized intracranial contents.  IMPRESSION: Extensive RIGHT facial/periorbital cellulitis, trace effusion without abscess. No postseptal extent.  RIGHT neck lymphadenopathy is likely reactive.   Electronically Signed   By: Elon Alas   On: 05/19/2014 17:23    Admission HPI: [name] [name] is a 39 year old woman with history of HLD, HTN, DM2 with peripheral neuropathy, hypothyroidism, anxiety, bipolar disorder, multiple skin abscesses, and group B strep endocarditis in 2012 presenting with R sided facial swelling with pus drainage.  She reports developing a painful pimple on the R side of her face 4 days ago at her hair line. She recently had her hair braided, so she thought it was due to her hair being too tight. She  tried to pop the pimple and expressed a "white speck." Since that time, she has developed increasing swelling that has spread to involve her R eye. She also noticed a painful not below her R ear. She says that she has had some clear drainage from the swelling near her hair line. The swelling around her eye is painful, and the pain is worse when she lays down. She has tried warm compresses and ice without relief. Yesterday, the skin started to become itchy, and she has tried hydroxyzine without relief. She has chronic blurry vision, but she denies any recent changes. She denies pain with movement of her eye, proptosis, or diplopia. She also denies fevers, chills or headaches. She does report some slight sensitivity to light. She denies any recent dental work or tooth pan.  She had a RLQ abdominal abscess in 04/2013 that grew MSSA that was pan-sensitive. She also had a left thigh necrotizing fascitis in 07/2012 that grew MRSA resistant to clindamycin, but sensitive to tetracycline and TMP/SMX. She also had cellulitis on her chin in 03/2011 that was treated with vanc/Zosyn that was narrowed to cefepime then switched to Primaxin and finally to Bactrim after an ID consult. She has multiple other MRSA infections that have been sensitive to clinda, tetracyclines, and TMP/SMX that have typically been treated with clindamycin, Bactrim and doxycycline. In the ER, she was started on clindamycin IV.  Hospital Course by problem list: Principal Problem:   Preseptal cellulitis of right eye Active Problems:   Hyperlipidemia   Essential hypertension   Anxiety and depression   Diabetic peripheral neuropathy   DM (diabetes mellitus), type 2, uncontrolled   Hypothyroidism   Carbuncle of face   Pre-septal Cellulitis: Patient presented with severe swelling of her right eyelid that extended to her right ear, face, and upper third of neck. She also had an area near her right temple, which she stated started out as  a pimple and then became larger with swelling extending to her eye and face. This area was fluctuant and concerning for abscess. A Maxillofacial CT was done which showed no evidence of orbital cellulitis and no abscess. Patient was placed on Linezolid (start date 05/19/14 ) due to her allergy to Vancomycin. Swelling had not significantly decreased by the next day so coverage was broadened with Zosyn (start date 05/21/13). An ultrasound was performed over the area of fluctuance on the right temple and was  concerning for possible abscess. ENT was consulted and drained/broke up this loculated area. ENT determined this to be a pre-auricular carbuncle. Patient felt immediate relief after the procedure and swelling improved dramatically. After discussing with Infectious Disease, patient was then transitioned to Doxycycline 100 mg Q12H (start date 05/22/14) and Keflex 500 mg Q6H (start date 05/22/14) to be completed for 7 days (end date: 05/29/14). Patient also discharged home with Percocet 5-325 mg 1-2 tabs Q8H PRN #20 tabs. Patient will be scheduled for a follow up appointment in the IM clinic in the next 2 weeks. Patient instructed to follow up with Dr. Erik Obey on Monday 08/27/80, but patient tried calling office and was told she would have a high copay. Patient instructed to call office again on Monday and to go to IM clinic if there was an issue.   Uncontrolled Type 2 DM with peripheral neuropathy: Patient's last HbA1c 8.6 on 02/03/14. Her repeat HbA1c was 12.4. She reported she had been taken off of her Metformin and was no longer taking it. Her sugars were elevated in the 250s-310s, but then after insulin adjustment were brought down to the 140-200 range. Her Lantus was adjusted to Lantus 20 units QHS. She was continued on her home Gabapentin for her neuropathy. She was discharged on Metformin 1000 mg BID, Lantus 20 units QHS, and Gabapentin 300 mg in AM and 600 mg QHS.   HTN: Patient's BP remained controlled in the  956O-130Q systolic. She had a few elevated pressures likely secondary to pain. She was continued on her home HCTZ 25 mg daily and Benicar 20 mg daily.   HLD: Patient was continued on her home Crestor 10 mg daily.   Hypothyroidism: Last TSH 4.670 on 02/03/14. Repeat TSH 7.979. Her TSH was thought to be elevated due to acute illness. She was continued on her home Synthroid 25 mcg daily. She will need repeat TSH in the next 4-6 weeks.   Anxiety and Bipolar Disorder: Patient was continued on her home Buspar and Prozac.    Discharge Vitals:   BP 143/109 mmHg  Pulse 89  Temp(Src) 98.4 F (36.9 C) (Oral)  Resp 18  Ht '5\' 10"'  (1.778 m)  Wt 146.512 kg (323 lb)  BMI 46.35 kg/m2  SpO2 99%  LMP 03/10/2014 Physical Exam General: alert, sitting up in bed, NAD HEENT: Right temple covered with clean gauze. There is minimal swelling around the right eye, but much improved.. Tenderness elicited upon palpation to area around right eye, cheek and around right ear. No lymphadenopathy.  CV: RRR, normal S1/S2, no m/g/r Pulm: CTA bilaterally, breaths non-labored Abd: BS+, soft, obese, non-tender Ext: warm, no edema, moves all Neuro: alert and oriented x 3, no cranial nerve deficits  Discharge Labs:  Results for orders placed or performed during the hospital encounter of 05/19/14 (from the past 24 hour(s))  Glucose, capillary     Status: Abnormal   Collection Time: 05/22/14  4:18 PM  Result Value Ref Range   Glucose-Capillary 223 (H) 70 - 99 mg/dL  Glucose, capillary     Status: Abnormal   Collection Time: 05/22/14  9:17 PM  Result Value Ref Range   Glucose-Capillary 105 (H) 70 - 99 mg/dL   Comment 1 Notify RN   Pregnancy, urine     Status: None   Collection Time: 05/22/14 10:18 PM  Result Value Ref Range   Preg Test, Ur NEGATIVE NEGATIVE  Glucose, capillary     Status: Abnormal   Collection Time: 05/23/14  6:37  AM  Result Value Ref Range   Glucose-Capillary 245 (H) 70 - 99 mg/dL   Comment 1  Notify RN   Glucose, capillary     Status: Abnormal   Collection Time: 05/23/14 11:48 AM  Result Value Ref Range   Glucose-Capillary 166 (H) 70 - 99 mg/dL   Comment 1 Notify RN     Signed: Albin Felling, MD 05/23/2014, 3:03 PM    Services Ordered on Discharge: None Equipment Ordered on Discharge: None

## 2014-05-23 NOTE — Progress Notes (Signed)
Subjective: Patient switched to PO antibiotics last night. Swelling much improved this morning. Patient still having pain but seems controlled with pain meds.   Objective: Vital signs in last 24 hours: Filed Vitals:   05/22/14 0513 05/22/14 1620 05/22/14 2056 05/23/14 0601  BP: 106/75 141/79 154/82 143/109  Pulse: 86 85 86 89  Temp: 97.8 F (36.6 C) 97.9 F (36.6 C) 97.6 F (36.4 C) 98.4 F (36.9 C)  TempSrc:  Oral Axillary Oral  Resp: 18 18    Height:      Weight:      SpO2: 100% 100% 100% 99%   Weight change:   Intake/Output Summary (Last 24 hours) at 05/23/14 0735 Last data filed at 05/22/14 1900  Gross per 24 hour  Intake    960 ml  Output      0 ml  Net    960 ml   Physical Exam General: alert, sitting up in bed, NAD HEENT: Right temple covered with clean gauze. There is minimal swelling around the right eye, but much improved.. Tenderness elicited upon palpation to area around right eye, cheek and around right ear. No lymphadenopathy.  CV: RRR, normal S1/S2, no m/g/r Pulm: CTA bilaterally, breaths non-labored Abd: BS+, soft, obese, non-tender Ext: warm, no edema, moves all Neuro: alert and oriented x 3, no cranial nerve deficits  Lab Results: Basic Metabolic Panel:  Recent Labs Lab 05/21/14 0616 05/22/14 0525  NA 134* 137  K 4.6 4.3  CL 100 102  CO2 24 28  GLUCOSE 293* 269*  BUN 13 15  CREATININE 0.80 0.94  CALCIUM 8.8 9.4   Liver Function Tests:  Recent Labs Lab 05/19/14 1517  AST 13  ALT 12  ALKPHOS 91  BILITOT 0.1*  PROT 6.9  ALBUMIN 3.0*   CBC:  Recent Labs Lab 05/19/14 1517 05/20/14 0500 05/22/14 0525  WBC 7.5 6.2 6.3  NEUTROABS 4.3  --   --   HGB 10.3* 10.2* 10.6*  HCT 32.3* 31.5* 33.2*  MCV 79.2 79.9 79.4  PLT 227 217 237   CBG:  Recent Labs Lab 05/22/14 0137 05/22/14 0627 05/22/14 1103 05/22/14 1618 05/22/14 2117 05/23/14 0637  GLUCAP 221* 247* 213* 223* 105* 245*   Hemoglobin A1C:  Recent Labs Lab  05/20/14 0500  HGBA1C 12.4*   Thyroid Function Tests:  Recent Labs Lab 05/20/14 0500  TSH 7.979*   Urinalysis:  Recent Labs Lab 05/19/14 1738  COLORURINE YELLOW  LABSPEC 1.045*  PHURINE 5.5  GLUCOSEU >1000*  HGBUR MODERATE*  BILIRUBINUR NEGATIVE  KETONESUR NEGATIVE  PROTEINUR 100*  UROBILINOGEN 0.2  NITRITE NEGATIVE  LEUKOCYTESUR NEGATIVE    Micro Results: Recent Results (from the past 240 hour(s))  MRSA PCR Screening     Status: None   Collection Time: 05/19/14 11:22 PM  Result Value Ref Range Status   MRSA by PCR NEGATIVE NEGATIVE Final    Comment:        The GeneXpert MRSA Assay (FDA approved for NASAL specimens only), is one component of a comprehensive MRSA colonization surveillance program. It is not intended to diagnose MRSA infection nor to guide or monitor treatment for MRSA infections.   Wound culture     Status: None (Preliminary result)   Collection Time: 05/21/14  9:15 PM  Result Value Ref Range Status   Specimen Description CARBUNCLE  Final   Special Requests Immunocompromised  Final   Gram Stain   Final    RARE WBC PRESENT,BOTH PMN AND MONONUCLEAR NO SQUAMOUS EPITHELIAL  CELLS SEEN NO ORGANISMS SEEN Performed at [facility]    Culture   Final    NO GROWTH 1 DAY Performed at [facility]    Report Status PENDING  Incomplete   Studies/Results: US Soft Tissue Head/neck  05/21/2014   CLINICAL DATA:  Swelling of the right lateral face and head. Patient with history of diabetes and multiple abscesses.  EXAM: ULTRASOUND OF HEAD/NECK SOFT TISSUES  TECHNIQUE: Ultrasound examination of the head and neck soft tissues was performed in the area of clinical concern.  COMPARISON:  Contrast-enhanced maxillofacial CT -05/19/2014  FINDINGS: There is marked edematous soft tissue swelling about the right lateral face.  Note is made of 2 hypoechoic well-defined lobular structures, measuring 0.7 x 1.3 x 0.7 cm (images 15-17) and 1.0 x 0.6 x  0.8 cm (images 21 through 23), the smaller which is noted to have a vessel running adjacent to it (images 25 and 26).  Note is made of an approximately 1.2 x 0.7 x 0.9 cm hypoechoic though mixed echogenic slightly serpiginous nodule/fluid collection within the subcutaneous tissues of the right lateral face (images 28 through 38).  IMPRESSION: 1. Marked soft tissue swelling about the right side of the face compatible with cellulitis as demonstrated on recently performed contrast-enhanced maxillofacial CT. 2. Hypoechoic well defined lobular structures within the adjacent soft tissues are favored to represent reactive lymph nodes. 3. Indeterminate approximately 1.2 cm minimally complex nodule/fluid collection within the adjacent subcutaneous tissues of the right lateral face - while potentially representative of a less well-defined reactive lymph node, a tiny developing fluid collection / abscess could have a similar appearance. Note, this structure is likely too small for percutaneous sampling and would recommend repeat imaging (preferably with a contrast-enhanced maxillofacial CT) only in the setting of failed conservative management.   Electronically Signed   By: Sandi Mariscal M.D.   On: 05/21/2014 12:19   Medications: I have reviewed the patient's current medications. Scheduled Meds: . cephALEXin  500 mg Oral 4 times per day  . doxycycline  100 mg Oral Q12H  . enoxaparin (LOVENOX) injection  70 mg Subcutaneous Q24H  . gabapentin  300 mg Oral q morning - 10a  . gabapentin  600 mg Oral QHS  . hydrochlorothiazide  25 mg Oral Daily  . ibuprofen  400 mg Oral TID  . insulin aspart  0-20 Units Subcutaneous TID WC  . insulin aspart  5 Units Subcutaneous TID WC  . insulin glargine  20 Units Subcutaneous QHS  . irbesartan  150 mg Oral Daily  . levothyroxine  25 mcg Oral QAC breakfast  . lidocaine-EPINEPHrine  30 mL Intradermal Once  . mupirocin cream   Topical BID  . rosuvastatin  10 mg Oral QHS    Continuous Infusions:  PRN Meds:.acetaminophen **OR** acetaminophen, diphenhydrAMINE, HYDROmorphone (DILAUDID) injection Assessment/Plan: Principal Problem:   Preseptal cellulitis of right eye Active Problems:   Hyperlipidemia   Essential hypertension   Anxiety and depression   Diabetic peripheral neuropathy   DM (diabetes mellitus), type 2, uncontrolled   Hypothyroidism   Carbuncle of face Pre-septal Cellulitis: Swelling much improved today. Patient states she was told by ENT to follow up in their office, but when she called she found out appointment her co-pay would be $100, which the patient cannot afford. She has orange card. Will discuss with SW to see if there is an ENT office that accepts orange card. - Stopped Linezolid 600 mg IV Q12H (start date: 05/19/14- 05/22/14) - Stopped  Zosyn 3.375 g IV Q8H (start date: 05/21/14- 05/22/14) - Started Doxycycline 100 mg Q12H (start date 05/22/14) and Keflex 500 mg Q6H (start date 05/22/14), will need 10 days total of antibiotics. Will discharge home with total of 7 days of Doxy and Keflex.  - Consult SW about ENT follow up - Continue Dilaudid 1 mg Q4H PRN - Benadryl 25 mg Q6H PRN itching  Uncontrolled Type 2 DM with peripheral neuropathy: Last A1c 8.6 on 02/03/14. Reports being taken off of metformin, so she has not been taking it. Repeat HbA1c very elevated at 12.4. Blood sugars are mildly elevated in 100s-250s.  - CBGs 4 times daily - Continue holding Metformin, will resume upon discharge - Continue Lantus 20 units QHS - Resistant ISS - Continue Novolog 5 units TID with meals - Continue home Gabapentin  HTN: BP in [id number] systolic. May have mildly elevated pressures 2/2 pain.  - Continue home HCTZ 25 mg daily and Benicar 20 mg daily  HLD: - Continue home Crestor 10 mg daily  Hypothyroidism: Last TSH 4.670 on 02/03/14. She takes Synthroid 25 mcg daily at home. - Continue home Synthroid - Repeat TSH 7.979, could be elevated 2/2  acute illness. Will need repeat TSH as outpatient.   Anxiety and Bipolar Disorder - Continue home Buspar and Prozac  Diet: Carb modified  VTE PPx: Lovenox SQ Dispo: Discharge likely today.   The patient does have a current PCP Jacques Earthly, MD) and does need an [facility] hospital follow-up appointment after discharge.  The patient does not have transportation limitations that hinder transportation to clinic appointments.  .Services Needed at time of discharge: Y = Yes, Blank = No PT:   OT:   RN:   Equipment:   Other:     LOS: 4 days   Albin Felling, MD 05/23/2014, 7:35 AM

## 2014-05-25 LAB — WOUND CULTURE

## 2014-06-11 ENCOUNTER — Encounter: Payer: Self-pay | Admitting: Internal Medicine

## 2014-06-11 ENCOUNTER — Ambulatory Visit (INDEPENDENT_AMBULATORY_CARE_PROVIDER_SITE_OTHER): Payer: Self-pay | Admitting: Internal Medicine

## 2014-06-11 VITALS — BP 155/90 | HR 87 | Temp 97.6°F | Ht 69.5 in | Wt 328.1 lb

## 2014-06-11 DIAGNOSIS — I10 Essential (primary) hypertension: Secondary | ICD-10-CM

## 2014-06-11 DIAGNOSIS — L0203 Carbuncle of face: Secondary | ICD-10-CM

## 2014-06-11 DIAGNOSIS — E11331 Type 2 diabetes mellitus with moderate nonproliferative diabetic retinopathy with macular edema: Secondary | ICD-10-CM

## 2014-06-11 DIAGNOSIS — E113319 Type 2 diabetes mellitus with moderate nonproliferative diabetic retinopathy with macular edema, unspecified eye: Secondary | ICD-10-CM

## 2014-06-11 DIAGNOSIS — D509 Iron deficiency anemia, unspecified: Secondary | ICD-10-CM

## 2014-06-11 DIAGNOSIS — E039 Hypothyroidism, unspecified: Secondary | ICD-10-CM

## 2014-06-11 DIAGNOSIS — Z794 Long term (current) use of insulin: Secondary | ICD-10-CM

## 2014-06-11 MED ORDER — INSULIN GLARGINE 100 UNIT/ML ~~LOC~~ SOLN: 23.0000 [IU] | Freq: Every day | SUBCUTANEOUS | Status: DC

## 2014-06-11 MED ORDER — METFORMIN HCL 1000 MG PO TABS: 1000.0000 mg | ORAL_TABLET | Freq: Two times a day (BID) | ORAL | Status: DC

## 2014-06-11 NOTE — Progress Notes (Signed)
Patient ID: [name] [name], female   DOB: 05-10-1976, 39 y.o.   MRN: [id number]    Subjective:   Patient ID: [name] [name] female   DOB: 07-21-1975 39 y.o.   MRN: [id number]  HPI: [name] is a 39 y.o. woman with past medical history of uncontrolled insulin-dependent Type II DM, hypertension, hyperlipidemia, hypothyroidism on replacement, chronic microcytic anemia, anxiety/depression, and bipolar disorder who presents for hospital follow-up visit.   She was hospitalized from 12/28-05/23/14 for facial carbuncle requiring I & D by ENT. Cultures revealed MSSA. She was treated initially with zyvox due vancomycin allergy then doxycyline and keflex which she reports completing the complete course. She reports she was just seen today by Dr. Erik [name] who said she was healing well and did not need to see him anymore for follow-up. She denies any recurrent skin infections.   Her last A1c was 12.4 on 05/20/14. She reports compliance with taking Lantus 20 U and Novolog 5 U with meals. She was instructed to start taking metformin 1000 mg BID on discharge but for some reason was not aware about this. She checks her blood sugar three times daily with average in 300-400's. She did not bring her glucose meter today. She denies symptomatic hypoglycemia. She reports chronic blurry vision, polydipsia, and neuropathy but denies polyuria, polyphagia, or foot injury/ulcer. She is trying to follow a healthy diet and exercise but has gained weight recently.     Her last TSH level was elevated at 7.9 on 05/20/14 during hospitalization. She reports compliance with taking synthroid 25 mcg daily. She reports flutter-like feeling in her chest and lightheadedness without syncope. She denies chest pain or history of atrial fibrillation, CHF, valvular disorder, or arhythmia. She has chronic trace b/l LE edema but denies DOE or orthopnea. 2D-echo on 07/31/12 was normal.    She reports compliance with taking HCTZ  and olmesartan for hypertension. She reports lightheadedness, blurry vision, and LE swelling but denies chest pain or headache.   She has history of chronic microcytic anemia of unknown etiology requiring blood transfusion during hospitalization in 2014 and was FOBT positive at that time with no further GI or hematology work-up. Her subsequent FOBT x 2 was negative in 2014. Anemia panel in 2014 revealed elevated ferritin levels and low TIBC (however in setting of infection). She is not on iron replacement. She reports chronic lightheadedness and flutter-like feeling in her chest. She denies heavy periods, hematuria, or dark/bright red blood stools.    Past Medical History  Diagnosis Date  . Hyperlipidemia   . Hypertension   . Abscess     history of multiple abscesses  . Obesity   . Peripheral neuropathy   . Abnormal Pap smear of cervix 2009  . Endocarditis 2002    subacute bacterial endocarditis.   . Edema of lower extremity   . Family history of anesthesia complication     "my mom has a hard time coming out from under"  . Heart murmur   . Chronic bronchitis     "get it q yr" (05/13/2013)  . OSA on CPAP   . Hypothyroidism   . Type II diabetes mellitus   . Anemia of chronic disease 2002  . History of blood transfusion     "just low blood count" (05/13/2013)  . GERD (gastroesophageal reflux disease)   . Chronic pain   . Anxiety   . Depression   . Bipolar disorder   . CHF (congestive heart failure)  pt denies this hx on 05/13/2013  . Hypothyroidism, adult 03/21/2014  . Cellulitis 05/21/2014    right eye   Current Outpatient Prescriptions  Medication Sig Dispense Refill  . Blood Glucose Monitoring Suppl (BLOOD GLUCOSE METER) kit Check blood sugar up to 3 times a day dx code 250.02 insulin requiring 1 each 0  . busPIRone (BUSPAR) 10 MG tablet Take 1 tablet (10 mg total) by mouth 2 (two) times daily. 60 tablet 0  . cephALEXin (KEFLEX) 500 MG capsule Take 1 capsule (500 mg  total) by mouth every 6 (six) hours. 24 capsule 0  . diphenhydrAMINE (BENADRYL) 25 mg capsule Take 1 capsule (25 mg total) by mouth every 6 (six) hours as needed for itching. 30 capsule 0  . doxycycline (VIBRA-TABS) 100 MG tablet Take 1 tablet (100 mg total) by mouth every 12 (twelve) hours. 12 tablet 0  . FLUoxetine (PROZAC) 20 MG tablet Take 1 tablet (20 mg total) by mouth daily. 30 tablet 3  . gabapentin (NEURONTIN) 300 MG capsule Take 300 mg in the morning and lunch and 600 mg at night 120 capsule 1  . glucose blood (WAVESENSE PRESTO) test strip Check blood sugar up to 3 times a day dx code 250.02 insulin requiring 100 each 12  . hydrochlorothiazide (HYDRODIURIL) 25 MG tablet Take 1 tablet (25 mg total) by mouth daily. 30 tablet 2  . hydrochlorothiazide (HYDRODIURIL) 25 MG tablet Take 1 tablet (25 mg total) by mouth daily. 30 tablet 0  . ibuprofen (ADVIL,MOTRIN) 600 MG tablet Take 1 tablet (600 mg total) by mouth 3 (three) times daily. 30 tablet 0  . insulin aspart (NOVOLOG) 100 UNIT/ML injection Inject 5 Units into the skin 3 (three) times daily with meals. 10 mL 12  . insulin glargine (LANTUS) 100 UNIT/ML injection Inject 0.2 mLs (20 Units total) into the skin at bedtime. 10 mL 12  . Lancets 30G MISC Check blood sugar up to 3 times a day dx code 250.02 insulin requiring 100 each 5  . levothyroxine (SYNTHROID, LEVOTHROID) 25 MCG tablet Take 1 tablet (25 mcg total) by mouth daily before breakfast. 30 tablet 0  . metFORMIN (GLUCOPHAGE) 1000 MG tablet Take 1 tablet (1,000 mg total) by mouth 2 (two) times daily with a meal. 60 tablet 3  . olmesartan (BENICAR) 20 MG tablet Take 1 tablet (20 mg total) by mouth daily. 30 tablet 12  . oxyCODONE-acetaminophen (PERCOCET/ROXICET) 5-325 MG per tablet Take 1-2 tablets by mouth every 6 (six) hours as needed for moderate pain or severe pain. 20 tablet 0  . rosuvastatin (CRESTOR) 10 MG tablet Take 1 tablet (10 mg total) by mouth at bedtime. 30 tablet 11   No  current facility-administered medications for this visit.   Family History  Problem Relation Age of Onset  . Heart failure Mother   . Diabetes Mother   . Kidney disease Mother   . Kidney disease Father   . Diabetes Paternal Grandmother   . Heart failure Paternal Grandmother    History   Social History  . Marital Status: Single    Spouse Name: N/A    Number of Children: N/A  . Years of Education: N/A   Occupational History  . telemarketer     Phone surveys   Social History Main Topics  . Smoking status: Current Every Day Smoker -- 0.10 packs/day for 6 years    Types: Cigarettes  . Smokeless tobacco: Never Used     Comment: "TRYING TO QUIT"  . Alcohol  Use: No  . Drug Use: No  . Sexual Activity: Not Currently   Other Topics Concern  . None   Social History Narrative   Lives with mother currently,    Review of Systems: Review of Systems  Constitutional: Negative for fever and chills.       Weight gain  HENT: Negative for congestion.   Eyes: Positive for blurred vision (chronic).  Respiratory: Negative for cough, shortness of breath and wheezing.   Cardiovascular: Positive for palpitations ("flutter in chest") and leg swelling (chronic b/L LE). Negative for chest pain and orthopnea.  Gastrointestinal: Negative for nausea, vomiting, abdominal pain, diarrhea, constipation and blood in stool.  Genitourinary: Negative for dysuria, urgency, frequency and hematuria.  Skin: Negative for rash.  Neurological: Positive for dizziness (lightheadedness ) and sensory change (chronic peripheral neuropathy ). Negative for headaches.    Objective:  Physical Exam: Filed Vitals:   06/11/14 1452  BP: 155/90  Pulse: 87  Temp: 97.6 F (36.4 C)  TempSrc: Oral  Height: 5' 9.5" (1.765 m)  Weight: 328 lb 1.6 oz (148.825 kg)  SpO2: 98%    Physical Exam  Constitutional: She is oriented to person, place, and time. She appears well-developed and well-nourished. No distress.  HENT:    Head: Normocephalic and atraumatic.  Right Ear: External ear normal.  Left Ear: External ear normal.  Nose: Nose normal.  Mouth/Throat: Oropharynx is clear and moist. No oropharyngeal exudate.  Well-healed right facial carbuncle s/p I & D with no signs of infection   Eyes: Conjunctivae and EOM are normal. Pupils are equal, round, and reactive to light. Right eye exhibits no discharge. Left eye exhibits no discharge. No scleral icterus.  Neck: Normal range of motion. Neck supple.  Cardiovascular: Normal rate, regular rhythm and normal heart sounds.   Pulmonary/Chest: Effort normal and breath sounds normal. No respiratory distress. She has no wheezes. She has no rales.  Abdominal: Soft. Bowel sounds are normal. She exhibits no distension. There is no tenderness. There is no rebound and no guarding.  Musculoskeletal: Normal range of motion. She exhibits edema (Trace b/l LE). She exhibits no tenderness.  Neurological: She is alert and oriented to person, place, and time.  Skin: Skin is warm and dry. No rash noted. She is not diaphoretic. No erythema. No pallor.  Psychiatric: Her behavior is normal. Judgment and thought content normal.  Flat affect    Assessment & Plan:   Please see problem list for problem-based assessment and plan

## 2014-06-11 NOTE — Patient Instructions (Signed)
-  Start taking metformin 1000 mg twice a day and Lantus 23 U daily  -Come back in 3-4 weeks for your diabetes and thyroid check, nice seeing you today   General Instructions:   Please bring your medicines with you each time you come to clinic.  Medicines may include prescription medications, over-the-counter medications, herbal remedies, eye drops, vitamins, or other pills.   Progress Toward Treatment Goals:  Treatment Goal 09/11/2013  Hemoglobin A1C deteriorated  Blood pressure deteriorated  Stop smoking smoking less    Self Care Goals & Plans:  Self Care Goal 06/11/2014  Manage my medications take my medicines as prescribed; bring my medications to every visit; refill my medications on time; follow the sick day instructions if I am sick  Monitor my health keep track of my blood glucose; check my feet daily  Eat healthy foods eat more vegetables; eat fruit for snacks and desserts; eat baked foods instead of fried foods; eat foods that are low in salt; eat smaller portions; drink diet soda or water instead of juice or soda  Be physically active find an activity I enjoy  Stop smoking -  Meeting treatment goals -    Home Blood Glucose Monitoring 09/11/2013  Check my blood sugar 4 times a day  When to check my blood sugar before meals     Care Management & Community Referrals:  Referral 09/11/2013  Referrals made to community resources none

## 2014-06-13 NOTE — Assessment & Plan Note (Signed)
Assessment: Pt with recent hospitalization for facial carbuncle s/p I & D and antibiotic therapy with cultures indicating MSSA who presents with well-healed infection.   Plan:  -Pt reports seeing ENT today and does not need further follow-up -Monitor for reoccurrence

## 2014-06-13 NOTE — Assessment & Plan Note (Addendum)
Assessment: Pt is a menstruating female with + FOBT in 2014 with chronic microcytic anemia of unclear etiology with last anemia panel March 2014 (in setting of infection) requiring blood transfusion in 2014 with baseline Hg 10 who presents with no active bleeding or hemodynamic instability.   Plan: -Last CBC on 12/31/5 with Hg 10.6 at baseline 10 -Last UA on 05/19/14 with hematuria  -Obtain anemia panel, UA, blood smear review, LDH, PT/PTT, FOBT at next visit for further work-up (could not obtain at current visit due to time constraint)  -Consider hematology referral if unclear etiology

## 2014-06-13 NOTE — Assessment & Plan Note (Addendum)
Assessment: Pt with hypothyroidism of unclear etiology with last TSH elevated at 7.979 in setting of illness compliant with thyroid replacement therapy who presents with no symptoms of thyroid dysfunction.   Plan:  -Continue synthroid 25 mcg daiy -Recheck TSH level at next visit and adjust synthroid dosage as necessary

## 2014-06-13 NOTE — Assessment & Plan Note (Signed)
Assessment: Pt with moderate to poorly controlled hypertension compliant with two-class (diuretic & ARB) anti-hypertensive therapy who presents with blood pressure of 155/90.   Plan:  -BP 155/90 not at goal <140/90 -Continue HCTZ 25 mg daily and losartan 20 mg daily -Consider adding amlodipine at next visit -Last BMP on 05/22/14 normal

## 2014-06-13 NOTE — Assessment & Plan Note (Addendum)
Assessment: Pt with last A1c 12.4 on 05/20/14 compliant with insulin therapy with no recent symptomatic hypoglycemia who presents with persistently elevated home blood sugars in 300-400's.   Plan:  -A1c 12.4 not at goal <7, start metformin 1000 mg BID and increase Lantus 20 U to 23 U daily. Continue Novolog 5 U with meals. Pt to return in 3-4 weeks with glucose meter readings for further insulin adjustment.  -BP 155/90 not at goal <140/90, consider adding amlodipine at next visit. Continue HCTZ 25 mg daily and losartan 20 mg daily -LDL 95 at goal <100, continue rosuvastatin 10 mg daily  -Perform annual foot exam at next visit  -Last annual eye exam on 07/23/13 with mild to moderate NPDR with macular edema -Last annual urine microalbumin on 07/25/10 with proteinuria, repeat at next visit to assess effectiveness of ARB therapy  -Continue gabapentin 300 mg in AM and 600 mg in PM -BMI 47 not at goal <30, encourage weight loss and meeting with Butch Penny Plyler at next visit

## 2014-06-17 ENCOUNTER — Other Ambulatory Visit: Payer: Self-pay | Admitting: *Deleted

## 2014-06-17 DIAGNOSIS — E039 Hypothyroidism, unspecified: Secondary | ICD-10-CM

## 2014-06-17 NOTE — Progress Notes (Signed)
Case discussed with Dr. Rabbani soon after the resident saw the patient.  We reviewed the resident's history and exam and pertinent patient test results.  I agree with the assessment, diagnosis, and plan of care documented in the resident's note. 

## 2014-06-23 MED ORDER — LEVOTHYROXINE SODIUM 25 MCG PO TABS: 25.0000 ug | ORAL_TABLET | Freq: Every day | ORAL | Status: DC

## 2014-06-23 NOTE — Telephone Encounter (Signed)
FU Rx call to pharmacy.

## 2014-07-01 ENCOUNTER — Other Ambulatory Visit: Payer: Self-pay | Admitting: *Deleted

## 2014-07-01 DIAGNOSIS — I10 Essential (primary) hypertension: Secondary | ICD-10-CM

## 2014-07-08 ENCOUNTER — Telehealth: Payer: Self-pay | Admitting: Internal Medicine

## 2014-07-08 MED ORDER — HYDROCHLOROTHIAZIDE 25 MG PO TABS: 25.0000 mg | ORAL_TABLET | Freq: Every day | ORAL | Status: DC

## 2014-07-08 NOTE — Telephone Encounter (Signed)
Call to patient to confirm appointment for 07/09/14 at 9:45 lmtcb

## 2014-07-09 ENCOUNTER — Encounter: Payer: Self-pay | Admitting: Internal Medicine

## 2014-07-09 ENCOUNTER — Ambulatory Visit (INDEPENDENT_AMBULATORY_CARE_PROVIDER_SITE_OTHER): Payer: Self-pay | Admitting: Internal Medicine

## 2014-07-09 VITALS — BP 132/87 | HR 83 | Temp 97.9°F | Resp 20 | Ht 68.5 in | Wt 326.3 lb

## 2014-07-09 DIAGNOSIS — E039 Hypothyroidism, unspecified: Secondary | ICD-10-CM

## 2014-07-09 DIAGNOSIS — E11331 Type 2 diabetes mellitus with moderate nonproliferative diabetic retinopathy with macular edema: Secondary | ICD-10-CM

## 2014-07-09 DIAGNOSIS — D509 Iron deficiency anemia, unspecified: Secondary | ICD-10-CM

## 2014-07-09 DIAGNOSIS — E1165 Type 2 diabetes mellitus with hyperglycemia: Secondary | ICD-10-CM

## 2014-07-09 DIAGNOSIS — R87612 Low grade squamous intraepithelial lesion on cytologic smear of cervix (LGSIL): Secondary | ICD-10-CM

## 2014-07-09 DIAGNOSIS — I10 Essential (primary) hypertension: Secondary | ICD-10-CM

## 2014-07-09 DIAGNOSIS — E113319 Type 2 diabetes mellitus with moderate nonproliferative diabetic retinopathy with macular edema, unspecified eye: Secondary | ICD-10-CM

## 2014-07-09 LAB — ANEMIA PANEL
%SAT: 15 % — ABNORMAL LOW (ref 20–55)
ABS RETIC: 89 10*3/uL (ref 19.0–186.0)
Ferritin: 96 ng/mL (ref 10–291)
Folate: 16 ng/mL
Iron: 36 ug/dL — ABNORMAL LOW (ref 42–145)
RBC.: 4.45 MIL/uL (ref 3.87–5.11)
RETIC CT PCT: 2 % (ref 0.4–2.3)
TIBC: 243 ug/dL — AB (ref 250–470)
UIBC: 207 ug/dL (ref 125–400)
Vitamin B-12: 411 pg/mL (ref 211–911)

## 2014-07-09 LAB — GLUCOSE, CAPILLARY: GLUCOSE-CAPILLARY: 167 mg/dL — AB (ref 70–99)

## 2014-07-09 LAB — TSH: TSH: 6.297 u[IU]/mL — AB (ref 0.350–4.500)

## 2014-07-09 MED ORDER — METFORMIN HCL 1000 MG PO TABS: 500.0000 mg | ORAL_TABLET | Freq: Every day | ORAL | Status: DC

## 2014-07-09 MED ORDER — INSULIN ASPART 100 UNIT/ML ~~LOC~~ SOLN: 7.0000 [IU] | Freq: Three times a day (TID) | SUBCUTANEOUS | Status: DC

## 2014-07-09 NOTE — Patient Instructions (Addendum)
General Instructions: -You have been referred to the Gynecologist. It is very important that you have a Pap smear every year since you had Low Grade squamous intraepithelial lesion (LSIL) in 2009.  -Increase the Novolog insulin to 7 units with meals. Continue checking your blood sugar 3 times per day.  -Take 500mg  (1/2 tablet) of metformin daily with breakfast for one week. If you do not have upset stomach for that 1st week, increase the metformin to 500mg  twice daily with meals.  -Return to the clinic in 3-4 weeks for follow up with your labs and the diabetes.    Please bring your medicines with you each time you come to clinic.  Medicines may include prescription medications, over-the-counter medications, herbal remedies, eye drops, vitamins, or other pills.   Progress Toward Treatment Goals:  Treatment Goal 09/11/2013  Hemoglobin A1C deteriorated  Blood pressure deteriorated  Stop smoking smoking less    Self Care Goals & Plans:  Self Care Goal 07/09/2014  Manage my medications take my medicines as prescribed; bring my medications to every visit; refill my medications on time  Monitor my health keep track of my blood glucose; bring my glucose meter and log to each visit  Eat healthy foods drink diet soda or water instead of juice or soda; eat fruit for snacks and desserts; eat baked foods instead of fried foods  Be physically active take a walk every day  Stop smoking -  Meeting treatment goals -    Home Blood Glucose Monitoring 09/11/2013  Check my blood sugar 4 times a day  When to check my blood sugar before meals     Care Management & Community Referrals:  Referral 09/11/2013  Referrals made to community resources none

## 2014-07-09 NOTE — Progress Notes (Signed)
   Subjective:    Patient ID: [name] [name], female    DOB: 06/16/1975, 39 y.o.   MRN: [id number]  HPI [name] [name] is a 39 yr old woman with PMH of uncontrolled DM2, HTN, HLP, hypothyroidism, anxiety/depression, and bipolar disorder who presents for follow up visit for her uncontrolled diabetes and elevated BP.   DM2: She was seen at the [facility] on 06/11/14 and her blood sugars were noted to elevated. Her Lantus was increased from 20 units to 23 units and she was started on metformin 1000mg  BID and encouraged to not skip her Novolog 5 units with meals. She was also referred to see the diabetes educator, Debera Lat but she has not been able to follow up with that appointment.   Today she tells me that her CBGs at home have been running in the 100-120 range before breakfast. She denies hypoglycemic symptoms. She denies diarrhea with the metformin but has had stomach upset and stopped taking it a few weeks ago.    HTN: Her BP was noted to be elevated at 155/90 during her last [facility] visit. She was on HCTZ 25mg  daily and losartan 20mg  daily which were not changed at that time.   Review of Systems  Constitutional: Positive for fatigue. Negative for fever, chills, activity change, appetite change and unexpected weight change.  Respiratory: Negative for cough, shortness of breath and wheezing.   Cardiovascular: Negative for chest pain.  Gastrointestinal: Negative for abdominal pain and constipation.  Endocrine: Negative for cold intolerance.       Brittle hair  Genitourinary: Positive for menstrual problem. Negative for dysuria.       Menorrhagia  Musculoskeletal: Positive for myalgias.       Has had myalgia for 1 yr  Neurological: Negative for dizziness and light-headedness.  Psychiatric/Behavioral: Negative for agitation.       Objective:   Physical Exam  Constitutional: She is oriented to person, place, and time. No distress.  Eyes: Conjunctivae are normal. No scleral icterus.  Neck: No  thyromegaly present.  Cardiovascular: Normal rate and regular rhythm.   Pulmonary/Chest: Effort normal and breath sounds normal. No respiratory distress.  Abdominal: Soft. There is no tenderness.  Musculoskeletal: She exhibits edema. She exhibits no tenderness.  Nonpitting edema v adiposity of bilateral dorsal feet  Neurological: She is alert and oriented to person, place, and time. Coordination normal.  Skin: Skin is warm and dry. No rash noted. She is not diaphoretic. No erythema. No pallor.  Psychiatric:  Flat affect, pt states that she is sad about her mom getting surgery today  Nursing note and vitals reviewed.         Assessment & Plan:

## 2014-07-09 NOTE — Assessment & Plan Note (Addendum)
Check anemia panel today. [facility]erred [facility] of heavy menstrual bleeding (has to change tampons every 2 hours, bleeds for 3-4 days with regular cycles).

## 2014-07-09 NOTE — Assessment & Plan Note (Signed)
BP Readings from Last 3 Encounters:  07/09/14 132/87  06/11/14 155/90  05/23/14 143/109    Lab Results  Component Value Date   NA 137 05/22/2014   K 4.3 05/22/2014   CREATININE 0.94 05/22/2014    Assessment: Blood pressure control:  controlled Progress toward BP goal:   at goal Comments: she is on HCTZ 20mg  daily, Benicar 20mg  daily  Plan: Medications:  continue current medications Educational resources provided: brochure Self management tools provided:   Other plans: Follow up in 1 month.

## 2014-07-09 NOTE — Assessment & Plan Note (Signed)
Will check TSH during this visit, continue synthroid 27mcg daily for now. Her symptoms of fatigue, intermittent body aches, brittle hair, may be secondary to hypothyroidism. WIll wait for the TSH to adjust her synthroid.

## 2014-07-09 NOTE — Assessment & Plan Note (Signed)
Lab Results  Component Value Date   HGBA1C 12.4* 05/20/2014   HGBA1C 8.6 02/03/2014   HGBA1C 14.0 07/23/2013     Assessment: Diabetes control:  not controlled Progress toward A1C goal:   not at goal Comments: on Lantus 23 units qHS with no am hypoglycemia. Has CBG in 300s range during the day.   Plan: Medications:  increase Novolog to 7 units TID ac, may need higher doses Home glucose monitoring: Frequency:   Timing:   Instruction/counseling given: reminded to bring blood glucose meter & log to each visit Educational resources provided: brochure Self management tools provided:   Other plans: Follow up in 3-4 weeks. Checked urine micro/cr, performed foot exam.

## 2014-07-09 NOTE — Assessment & Plan Note (Signed)
Last PAP smear in 2009 with LCSIL. She has menorrhagia with cramps, has been fatigued with intermittent body aches.  -[facility] and further follow up as needed.

## 2014-07-10 LAB — MICROALBUMIN / CREATININE URINE RATIO
Creatinine, Urine: 126.3 mg/dL
MICROALB/CREAT RATIO: 901 mg/g — AB (ref 0.0–30.0)
Microalb, Ur: 113.8 mg/dL — ABNORMAL HIGH (ref <2.0)

## 2014-07-10 NOTE — Progress Notes (Signed)
Case discussed with Dr. Kennerly soon after the resident saw the patient.  We reviewed the resident's history and exam and pertinent patient test results.  I agree with the assessment, diagnosis, and plan of care documented in the resident's note. 

## 2014-07-11 ENCOUNTER — Encounter: Payer: Self-pay | Admitting: Family Medicine

## 2014-07-31 ENCOUNTER — Encounter: Payer: Self-pay | Admitting: *Deleted

## 2014-08-05 ENCOUNTER — Ambulatory Visit: Payer: Self-pay | Admitting: Internal Medicine

## 2014-08-18 ENCOUNTER — Encounter: Payer: Self-pay | Admitting: Family Medicine

## 2014-11-17 ENCOUNTER — Other Ambulatory Visit: Payer: Self-pay

## 2015-01-04 ENCOUNTER — Encounter (HOSPITAL_COMMUNITY): Payer: Self-pay | Admitting: *Deleted

## 2015-01-04 ENCOUNTER — Inpatient Hospital Stay (HOSPITAL_COMMUNITY)
Admission: EM | Admit: 2015-01-04 | Discharge: 2015-01-08 | DRG: 853 | Disposition: A | Discharge status: Home Health | Payer: Self-pay | Attending: Student in an Organized Health Care Education/Training Program | Admitting: Student in an Organized Health Care Education/Training Program

## 2015-01-04 DIAGNOSIS — E1142 Type 2 diabetes mellitus with diabetic polyneuropathy: Secondary | ICD-10-CM | POA: Diagnosis present

## 2015-01-04 DIAGNOSIS — Z794 Long term (current) use of insulin: Secondary | ICD-10-CM

## 2015-01-04 DIAGNOSIS — E119 Type 2 diabetes mellitus without complications: Secondary | ICD-10-CM | POA: Diagnosis present

## 2015-01-04 DIAGNOSIS — B9689 Other specified bacterial agents as the cause of diseases classified elsewhere: Secondary | ICD-10-CM

## 2015-01-04 DIAGNOSIS — E1122 Type 2 diabetes mellitus with diabetic chronic kidney disease: Secondary | ICD-10-CM | POA: Diagnosis present

## 2015-01-04 DIAGNOSIS — E1159 Type 2 diabetes mellitus with other circulatory complications: Secondary | ICD-10-CM | POA: Diagnosis present

## 2015-01-04 DIAGNOSIS — I639 Cerebral infarction, unspecified: Secondary | ICD-10-CM

## 2015-01-04 DIAGNOSIS — E1165 Type 2 diabetes mellitus with hyperglycemia: Secondary | ICD-10-CM | POA: Diagnosis present

## 2015-01-04 DIAGNOSIS — Z91199 Patient's noncompliance with other medical treatment and regimen due to unspecified reason: Secondary | ICD-10-CM

## 2015-01-04 DIAGNOSIS — E872 Acidosis: Secondary | ICD-10-CM | POA: Diagnosis present

## 2015-01-04 DIAGNOSIS — K611 Rectal abscess: Secondary | ICD-10-CM

## 2015-01-04 DIAGNOSIS — K219 Gastro-esophageal reflux disease without esophagitis: Secondary | ICD-10-CM | POA: Diagnosis present

## 2015-01-04 DIAGNOSIS — A4150 Gram-negative sepsis, unspecified: Principal | ICD-10-CM | POA: Diagnosis present

## 2015-01-04 DIAGNOSIS — F329 Major depressive disorder, single episode, unspecified: Secondary | ICD-10-CM

## 2015-01-04 DIAGNOSIS — E669 Obesity, unspecified: Secondary | ICD-10-CM | POA: Diagnosis present

## 2015-01-04 DIAGNOSIS — E039 Hypothyroidism, unspecified: Secondary | ICD-10-CM | POA: Diagnosis present

## 2015-01-04 DIAGNOSIS — I10 Essential (primary) hypertension: Secondary | ICD-10-CM | POA: Diagnosis present

## 2015-01-04 DIAGNOSIS — E785 Hyperlipidemia, unspecified: Secondary | ICD-10-CM | POA: Diagnosis present

## 2015-01-04 DIAGNOSIS — D509 Iron deficiency anemia, unspecified: Secondary | ICD-10-CM | POA: Diagnosis present

## 2015-01-04 DIAGNOSIS — K5900 Constipation, unspecified: Secondary | ICD-10-CM | POA: Diagnosis not present

## 2015-01-04 DIAGNOSIS — E11331 Type 2 diabetes mellitus with moderate nonproliferative diabetic retinopathy with macular edema: Secondary | ICD-10-CM | POA: Diagnosis present

## 2015-01-04 DIAGNOSIS — E1310 Other specified diabetes mellitus with ketoacidosis without coma: Secondary | ICD-10-CM | POA: Diagnosis present

## 2015-01-04 DIAGNOSIS — F319 Bipolar disorder, unspecified: Secondary | ICD-10-CM | POA: Diagnosis present

## 2015-01-04 DIAGNOSIS — F17210 Nicotine dependence, cigarettes, uncomplicated: Secondary | ICD-10-CM | POA: Diagnosis present

## 2015-01-04 DIAGNOSIS — N179 Acute kidney failure, unspecified: Secondary | ICD-10-CM | POA: Diagnosis present

## 2015-01-04 DIAGNOSIS — R739 Hyperglycemia, unspecified: Secondary | ICD-10-CM

## 2015-01-04 DIAGNOSIS — Z9119 Patient's noncompliance with other medical treatment and regimen: Secondary | ICD-10-CM | POA: Diagnosis present

## 2015-01-04 DIAGNOSIS — Z9114 Patient's other noncompliance with medication regimen: Secondary | ICD-10-CM | POA: Diagnosis present

## 2015-01-04 DIAGNOSIS — R7989 Other specified abnormal findings of blood chemistry: Secondary | ICD-10-CM | POA: Clinically undetermined

## 2015-01-04 DIAGNOSIS — G4733 Obstructive sleep apnea (adult) (pediatric): Secondary | ICD-10-CM | POA: Diagnosis present

## 2015-01-04 DIAGNOSIS — L0231 Cutaneous abscess of buttock: Secondary | ICD-10-CM | POA: Diagnosis present

## 2015-01-04 DIAGNOSIS — Z881 Allergy status to other antibiotic agents status: Secondary | ICD-10-CM

## 2015-01-04 DIAGNOSIS — Z6841 Body Mass Index (BMI) 40.0 and over, adult: Secondary | ICD-10-CM

## 2015-01-04 DIAGNOSIS — E1151 Type 2 diabetes mellitus with diabetic peripheral angiopathy without gangrene: Secondary | ICD-10-CM | POA: Diagnosis present

## 2015-01-04 DIAGNOSIS — E113319 Type 2 diabetes mellitus with moderate nonproliferative diabetic retinopathy with macular edema, unspecified eye: Secondary | ICD-10-CM

## 2015-01-04 DIAGNOSIS — Z79899 Other long term (current) drug therapy: Secondary | ICD-10-CM

## 2015-01-04 DIAGNOSIS — E2740 Unspecified adrenocortical insufficiency: Secondary | ICD-10-CM | POA: Diagnosis present

## 2015-01-04 LAB — CBC
HCT: 29.9 % — ABNORMAL LOW (ref 36.0–46.0)
Hemoglobin: 9.9 g/dL — ABNORMAL LOW (ref 12.0–15.0)
MCH: 25.9 pg — ABNORMAL LOW (ref 26.0–34.0)
MCHC: 33.1 g/dL (ref 30.0–36.0)
MCV: 78.3 fL (ref 78.0–100.0)
Platelets: 236 10*3/uL (ref 150–400)
RBC: 3.82 MIL/uL — ABNORMAL LOW (ref 3.87–5.11)
RDW: 13.9 % (ref 11.5–15.5)
WBC: 12.6 10*3/uL — ABNORMAL HIGH (ref 4.0–10.5)

## 2015-01-04 LAB — COMPREHENSIVE METABOLIC PANEL
ALT: 23 U/L (ref 14–54)
AST: 29 U/L (ref 15–41)
Albumin: 2.2 g/dL — ABNORMAL LOW (ref 3.5–5.0)
Alkaline Phosphatase: 181 U/L — ABNORMAL HIGH (ref 38–126)
Anion gap: 15 (ref 5–15)
BUN: 32 mg/dL — AB (ref 6–20)
CALCIUM: 8.5 mg/dL — AB (ref 8.9–10.3)
CO2: 20 mmol/L — ABNORMAL LOW (ref 22–32)
CREATININE: 1.85 mg/dL — AB (ref 0.44–1.00)
Chloride: 89 mmol/L — ABNORMAL LOW (ref 101–111)
GFR calc Af Amer: 39 mL/min — ABNORMAL LOW (ref >60)
GFR, EST NON AFRICAN AMERICAN: 34 mL/min — AB (ref >60)
Glucose, Bld: 610 mg/dL (ref 65–99)
Potassium: 4.4 mmol/L (ref 3.5–5.1)
Sodium: 124 mmol/L — ABNORMAL LOW (ref 135–145)
Total Bilirubin: 1.3 mg/dL — ABNORMAL HIGH (ref 0.3–1.2)
Total Protein: 6.7 g/dL (ref 6.5–8.1)

## 2015-01-04 LAB — I-STAT CG4 LACTIC ACID, ED
LACTIC ACID, VENOUS: 1.85 mmol/L (ref 0.5–2.0)
LACTIC ACID, VENOUS: 1.08 mmol/L (ref 0.5–2.0)

## 2015-01-04 LAB — CBG MONITORING, ED: GLUCOSE-CAPILLARY: 520 mg/dL — AB (ref 65–99)

## 2015-01-04 MED ORDER — SODIUM CHLORIDE 0.9 % IJ SOLN
3.0000 mL | Freq: Two times a day (BID) | INTRAMUSCULAR | Status: DC
  Administered 2015-01-04 – 2015-01-07 (×6): 3 mL via INTRAVENOUS

## 2015-01-04 MED ORDER — CLINDAMYCIN PHOSPHATE 600 MG/50ML IV SOLN
600.0000 mg | Freq: Three times a day (TID) | INTRAVENOUS | Status: DC
Start: 2015-01-04 — End: 2015-01-05
  Administered 2015-01-05: 600 mg via INTRAVENOUS
  Filled 2015-01-04 (×3): qty 50

## 2015-01-04 MED ORDER — OXYCODONE-ACETAMINOPHEN 5-325 MG PO TABS: 1.0000 | ORAL_TABLET | Freq: Once | ORAL | Status: DC

## 2015-01-04 MED ORDER — SODIUM CHLORIDE 0.9 % IV BOLUS (SEPSIS)
1000.0000 mL | Freq: Once | INTRAVENOUS | Status: AC
  Administered 2015-01-04: 1000 mL via INTRAVENOUS

## 2015-01-04 MED ORDER — SODIUM CHLORIDE 0.9 % IV SOLN
INTRAVENOUS | Status: DC
  Administered 2015-01-05: 6.7 [IU]/h via INTRAVENOUS
  Administered 2015-01-05: 6.6 [IU]/h via INTRAVENOUS
  Administered 2015-01-05: 4.8 [IU]/h via INTRAVENOUS
  Administered 2015-01-05: 4.6 [IU]/h via INTRAVENOUS
  Administered 2015-01-05: 9.9 [IU]/h via INTRAVENOUS
  Administered 2015-01-05: 4.4 [IU]/h via INTRAVENOUS
  Filled 2015-01-04: qty 2.5

## 2015-01-04 MED ORDER — ACETAMINOPHEN 325 MG PO TABS
650.0000 mg | ORAL_TABLET | Freq: Once | ORAL | Status: AC | PRN
  Administered 2015-01-04: 650 mg via ORAL

## 2015-01-04 MED ORDER — ROSUVASTATIN CALCIUM 10 MG PO TABS
10.0000 mg | ORAL_TABLET | Freq: Every day | ORAL | Status: DC
  Administered 2015-01-05 – 2015-01-07 (×3): 10 mg via ORAL
  Filled 2015-01-04 (×5): qty 1

## 2015-01-04 MED ORDER — SODIUM CHLORIDE 0.9 % IV SOLN
INTRAVENOUS | Status: DC
  Administered 2015-01-04: 1000 mL via INTRAVENOUS

## 2015-01-04 MED ORDER — SODIUM CHLORIDE 0.9 % IV SOLN
INTRAVENOUS | Status: DC
  Administered 2015-01-04: 5.4 [IU]/h via INTRAVENOUS
  Filled 2015-01-04: qty 2.5

## 2015-01-04 MED ORDER — DEXTROSE-NACL 5-0.45 % IV SOLN: INTRAVENOUS | Status: DC

## 2015-01-04 MED ORDER — HEPARIN SODIUM (PORCINE) 5000 UNIT/ML IJ SOLN
5000.0000 [IU] | Freq: Three times a day (TID) | INTRAMUSCULAR | Status: DC
  Administered 2015-01-05 – 2015-01-08 (×11): 5000 [IU] via SUBCUTANEOUS
  Filled 2015-01-04 (×15): qty 1

## 2015-01-04 MED ORDER — PIPERACILLIN-TAZOBACTAM 3.375 G IVPB
3.3750 g | Freq: Three times a day (TID) | INTRAVENOUS | Status: DC
  Administered 2015-01-05: 3.375 g via INTRAVENOUS
  Filled 2015-01-04 (×2): qty 50

## 2015-01-04 MED ORDER — DEXTROSE-NACL 5-0.45 % IV SOLN
INTRAVENOUS | Status: DC
  Administered 2015-01-05: 500 mL via INTRAVENOUS

## 2015-01-04 MED ORDER — MORPHINE SULFATE 4 MG/ML IJ SOLN
4.0000 mg | Freq: Once | INTRAMUSCULAR | Status: AC
  Administered 2015-01-04: 4 mg via INTRAVENOUS
  Filled 2015-01-04: qty 1

## 2015-01-04 MED ORDER — ONDANSETRON HCL 4 MG PO TABS: 4.0000 mg | ORAL_TABLET | Freq: Four times a day (QID) | ORAL | Status: DC | PRN

## 2015-01-04 MED ORDER — FLUOXETINE HCL 20 MG PO TABS
20.0000 mg | ORAL_TABLET | Freq: Every day | ORAL | Status: DC
  Administered 2015-01-05 – 2015-01-08 (×4): 20 mg via ORAL
  Filled 2015-01-04 (×4): qty 1

## 2015-01-04 MED ORDER — LEVOTHYROXINE SODIUM 25 MCG PO TABS
25.0000 ug | ORAL_TABLET | Freq: Every day | ORAL | Status: DC
  Administered 2015-01-05 – 2015-01-08 (×4): 25 ug via ORAL
  Filled 2015-01-04 (×7): qty 1

## 2015-01-04 MED ORDER — ACETAMINOPHEN 325 MG PO TABS
ORAL_TABLET | ORAL | Status: AC
  Filled 2015-01-04: qty 2

## 2015-01-04 MED ORDER — POTASSIUM CHLORIDE 10 MEQ/100ML IV SOLN
10.0000 meq | INTRAVENOUS | Status: AC
  Administered 2015-01-05: 10 meq via INTRAVENOUS
  Filled 2015-01-04: qty 100

## 2015-01-04 MED ORDER — MORPHINE SULFATE 2 MG/ML IJ SOLN
2.0000 mg | INTRAMUSCULAR | Status: DC | PRN
  Administered 2015-01-04 – 2015-01-05 (×4): 2 mg via INTRAVENOUS
  Filled 2015-01-04 (×4): qty 1

## 2015-01-04 MED ORDER — ONDANSETRON HCL 4 MG/2ML IJ SOLN: 4.0000 mg | Freq: Four times a day (QID) | INTRAMUSCULAR | Status: DC | PRN

## 2015-01-04 NOTE — ED Provider Notes (Signed)
CSN: [id number]     Arrival date & time 01/04/15  1701 History   First MD Initiated Contact with Patient 01/04/15 1807     Chief Complaint  Patient presents with  . Abscess  . Fever    HPI  [name] [name] is a 39 year old female with PMHx of DM2 and multiple complicated abscesses presenting today with an abscess and fevers. She reports feeling a "small pimple" on her right buttock Wednesday which continued to grow and became painful on Friday. Pain has gotten more severe and she reports that the boil opened and began draining Friday. She endorses fevers, chills and shortness of breath with the increasing pain. She is an insulin dependent type 2 diabetic but reports irregular insulin use and high blood sugars recently. She has not taken insulin since "about Wednesday". Denies headache, chest pain, abdominal pain, vomiting, diarrhea, constipation, difficulty urinating. Pt reports she is now feeling nauseous.   Past Medical History  Diagnosis Date  . Hyperlipidemia   . Hypertension   . Abscess     history of multiple abscesses  . Obesity   . Peripheral neuropathy   . Abnormal Pap smear of cervix 2009  . Endocarditis 2002    subacute bacterial endocarditis.   . Edema of lower extremity   . Family history of anesthesia complication     "my mom has a hard time coming out from under"  . Heart murmur   . Chronic bronchitis     "get it q yr" (05/13/2013)  . OSA on CPAP   . Hypothyroidism   . Type II diabetes mellitus   . Anemia of chronic disease 2002  . History of blood transfusion     "just low blood count" (05/13/2013)  . GERD (gastroesophageal reflux disease)   . Chronic pain   . Anxiety   . Depression   . Bipolar disorder   . CHF (congestive heart failure)     pt denies this hx on 05/13/2013  . Hypothyroidism, adult 03/21/2014  . Cellulitis 05/21/2014    right eye   Past Surgical History  Procedure Laterality Date  . Incision and drainage abscess      multiple I&Ds  .  Irrigation and debridement abscess Left 07/06/2012    Procedure: IRRIGATION AND DEBRIDEMENT ABSCESS BUTTOCKS AND THIGH;  Surgeon: Shann Medal, MD;  Location: [facility];  Service: General;  Laterality: Left;  . Incision and drainage of wound Left 07/07/2012    Procedure: IRRIGATION AND DEBRIDEMENT WOUND;  Surgeon: Harl Bowie, MD;  Location: [facility];  Service: General;  Laterality: Left;  . Incision and drainage abscess Left 07/09/2012    Procedure: DRESSING CHANGE, THIGH WOUND;  Surgeon: Harl Bowie, MD;  Location: [place];  Service: General;  Laterality: Left;  . Incision and drainage perirectal abscess Left 07/14/2012    Procedure: DEBRIDEMENT OF SKIN & SOFT TISSUE; DRESSING CHANGE UNDER ANESTHESIA;  Surgeon: Gayland Curry, MD,FACS;  Location: [facility];  Service: General;  Laterality: Left;  . Incision and drainage perirectal abscess Left 07/16/2012    Procedure: I&D Left Thigh;  Surgeon: Gwenyth Ober, MD;  Location: [place];  Service: General;  Laterality: Left;  . Irrigation and debridement abscess Left 08/10/2012    Procedure: IRRIGATION AND DEBRIDEMENT ABSCESS;  Surgeon: Madilyn Hook, DO;  Location: [facility];  Service: General;  Laterality: Left;  . Tonsillectomy  1994   Family History  Problem Relation Age of Onset  . Heart failure Mother   .  Diabetes Mother   . Kidney disease Mother   . Kidney disease Father   . Diabetes Paternal Grandmother   . Heart failure Paternal Grandmother    Social History  Substance Use Topics  . Smoking status: Current Every Day Smoker -- 0.10 packs/day for 6 years    Types: Cigarettes  . Smokeless tobacco: Never Used     Comment: 7-8 cigarettes a day  . Alcohol Use: No   OB History    No data available     Review of Systems  Constitutional: Positive for fever, chills and diaphoresis.  Respiratory: Positive for shortness of breath. Negative for wheezing.   Cardiovascular: Negative for chest pain and palpitations.  Gastrointestinal: Negative for  nausea, vomiting, abdominal pain, diarrhea and constipation.  Genitourinary: Negative for difficulty urinating.  Musculoskeletal: Negative for myalgias.  Skin: Positive for wound.  Neurological: Negative for dizziness and headaches.      Allergies  Vancomycin  Home Medications   Prior to Admission medications   Medication Sig Start Date End Date Taking? Authorizing Provider  busPIRone (BUSPAR) 10 MG tablet Take 1 tablet (10 mg total) by mouth 2 (two) times daily. 04/16/13  Yes Ricke Hey, MD  FLUoxetine (PROZAC) 20 MG tablet Take 1 tablet (20 mg total) by mouth daily. 02/05/14  Yes Nischal Dareen Piano, MD  gabapentin (NEURONTIN) 300 MG capsule Take 300 mg in the morning and lunch and 600 mg at night Patient taking differently: Take 300-600 mg by mouth 3 (three) times daily. Take 300 mg in the morning and lunch and 600 mg at night 09/11/13  Yes Nino Glow McLean-Scocozza, MD  hydrochlorothiazide (HYDRODIURIL) 25 MG tablet Take 1 tablet (25 mg total) by mouth daily. 07/08/14  Yes Bertha Stakes, MD  insulin aspart (NOVOLOG) 100 UNIT/ML injection Inject 7 Units into the skin 3 (three) times daily with meals. 07/09/14  Yes Blain Pais, MD  insulin glargine (LANTUS) 100 UNIT/ML injection Inject 0.23 mLs (23 Units total) into the skin at bedtime. 06/11/14  Yes Juluis Mire, MD  levothyroxine (SYNTHROID, LEVOTHROID) 25 MCG tablet Take 1 tablet (25 mcg total) by mouth daily before breakfast. 06/23/14  Yes Bartholomew Crews, MD  metFORMIN (GLUCOPHAGE) 1000 MG tablet Take 0.5 tablets (500 mg total) by mouth daily with breakfast. 07/09/14  Yes Blain Pais, MD  olmesartan (BENICAR) 20 MG tablet Take 1 tablet (20 mg total) by mouth daily. 08/22/13  Yes Ricke Hey, MD  rosuvastatin (CRESTOR) 10 MG tablet Take 1 tablet (10 mg total) by mouth at bedtime. 08/21/13 01/04/15 Yes Ricke Hey, MD  Blood Glucose Monitoring Suppl (BLOOD GLUCOSE METER) kit Check blood sugar up to  3 times a day dx code 250.02 insulin requiring 09/12/13   Nino Glow McLean-Scocozza, MD  diphenhydrAMINE (BENADRYL) 25 mg capsule Take 1 capsule (25 mg total) by mouth every 6 (six) hours as needed for itching. 05/23/14   Juliet Rude, MD  glucose blood (WAVESENSE PRESTO) test strip Check blood sugar up to 3 times a day dx code 250.02 insulin requiring 09/12/13   Nino Glow McLean-Scocozza, MD  ibuprofen (ADVIL,MOTRIN) 600 MG tablet Take 1 tablet (600 mg total) by mouth 3 (three) times daily. 05/23/14   Carly Montey Hora, MD  oxyCODONE-acetaminophen (PERCOCET/ROXICET) 5-325 MG per tablet Take 1-2 tablets by mouth every 6 (six) hours as needed for moderate pain or severe pain. 05/23/14   Carly J Rivet, MD   BP 128/60 mmHg  Pulse 120  Temp(Src) 101.4 F (  38.6 C) (Oral)  Resp 22  Ht '5\' 10"'  (1.778 m)  SpO2 97%  LMP 11/27/2014 Physical Exam  Constitutional: She is oriented to person, place, and time. She appears well-developed and well-nourished. She appears distressed.  HENT:  Head: Normocephalic and atraumatic.  Cardiovascular: Normal rate, regular rhythm and normal heart sounds.   Pulmonary/Chest: Effort normal and breath sounds normal.  Abdominal: Soft. She exhibits no distension. There is no tenderness.  Neurological: She is alert and oriented to person, place, and time.  Skin: Skin is warm and dry.  Skin breakdown of right gluteal cleft. Induration extending over entire right buttock. Does not extend into perineum or labia  Psychiatric: She has a normal mood and affect.    ED Course  Procedures (including critical care time) Labs Review Labs Reviewed  COMPREHENSIVE METABOLIC PANEL - Abnormal; Notable for the following:    Sodium 124 (*)    Chloride 89 (*)    CO2 20 (*)    Glucose, Bld 610 (*)    BUN 32 (*)    Creatinine, Ser 1.85 (*)    Calcium 8.5 (*)    Albumin 2.2 (*)    Alkaline Phosphatase 181 (*)    Total Bilirubin 1.3 (*)    GFR calc non Af Amer 34 (*)    GFR calc Af Amer 39 (*)     All other components within normal limits  CBC - Abnormal; Notable for the following:    WBC 12.6 (*)    RBC 3.82 (*)    Hemoglobin 9.9 (*)    HCT 29.9 (*)    MCH 25.9 (*)    All other components within normal limits  I-STAT CG4 LACTIC ACID, ED  I-STAT CG4 LACTIC ACID, ED     MDM   Final diagnoses:  Abscess of buttock, right  Hyperglycemia    1. Right buttock abscess - Glucose found to be 610 in ED, started on glucose stabilizer - Surgical consult given size, systemic symptoms and significant history of complicated abscesses; surgery agrees on admission but will not operate until glucose under control - Internal med admission to step down bed - Pharmacy consult d/t allergy to vancomycin; started on zosyn and clindamycin     [name] Szafran, PA-C 01/04/15 2152  Tanna Furry, MD 01/05/15 2787

## 2015-01-04 NOTE — Consult Note (Signed)
Reason for Consult:R buttock abscess Referring Physician: [name] is an 39 y.o. female.  HPI: [name] [name] presented to the emergency department complaining of right buttock pain for 5 days. It has been getting gradually worse. She has not had any significant drainage from it. She does have a history of multiple abscesses. She is an insulin-dependent diabetic and has not taken any insulin for the past 5 days.  Past Medical History  Diagnosis Date  . Hyperlipidemia   . Hypertension   . Abscess     history of multiple abscesses  . Obesity   . Peripheral neuropathy   . Abnormal Pap smear of cervix 2009  . Endocarditis 2002    subacute bacterial endocarditis.   . Edema of lower extremity   . Family history of anesthesia complication     "my mom has a hard time coming out from under"  . Heart murmur   . Chronic bronchitis     "get it q yr" (05/13/2013)  . OSA on CPAP   . Hypothyroidism   . Type II diabetes mellitus   . Anemia of chronic disease 2002  . History of blood transfusion     "just low blood count" (05/13/2013)  . GERD (gastroesophageal reflux disease)   . Chronic pain   . Anxiety   . Depression   . Bipolar disorder   . CHF (congestive heart failure)     pt denies this hx on 05/13/2013  . Hypothyroidism, adult 03/21/2014  . Cellulitis 05/21/2014    right eye    Past Surgical History  Procedure Laterality Date  . Incision and drainage abscess      multiple I&Ds  . Irrigation and debridement abscess Left 07/06/2012    Procedure: IRRIGATION AND DEBRIDEMENT ABSCESS BUTTOCKS AND THIGH;  Surgeon: Shann Medal, MD;  Location: [facility];  Service: General;  Laterality: Left;  . Incision and drainage of wound Left 07/07/2012    Procedure: IRRIGATION AND DEBRIDEMENT WOUND;  Surgeon: Harl Bowie, MD;  Location: [facility];  Service: General;  Laterality: Left;  . Incision and drainage abscess Left 07/09/2012    Procedure: DRESSING CHANGE, THIGH WOUND;   Surgeon: Harl Bowie, MD;  Location: [facility];  Service: General;  Laterality: Left;  . Incision and drainage perirectal abscess Left 07/14/2012    Procedure: DEBRIDEMENT OF SKIN & SOFT TISSUE; DRESSING CHANGE UNDER ANESTHESIA;  Surgeon: Gayland Curry, MD,FACS;  Location: [facility];  Service: General;  Laterality: Left;  . Incision and drainage perirectal abscess Left 07/16/2012    Procedure: I&D Left Thigh;  Surgeon: Gwenyth Ober, MD;  Location: [facility];  Service: General;  Laterality: Left;  . Irrigation and debridement abscess Left 08/10/2012    Procedure: IRRIGATION AND DEBRIDEMENT ABSCESS;  Surgeon: Madilyn Hook, DO;  Location: [facility];  Service: General;  Laterality: Left;  . Tonsillectomy  1994    Family History  Problem Relation Age of Onset  . Heart failure Mother   . Diabetes Mother   . Kidney disease Mother   . Kidney disease Father   . Diabetes Paternal Grandmother   . Heart failure Paternal Grandmother     Social History:  reports that she has been smoking Cigarettes.  She has a .6 pack-year smoking history. She has never used smokeless tobacco. She reports that she does not drink alcohol or use illicit drugs.  Allergies:  Allergies  Allergen Reactions  . Vancomycin Other (See Comments)  Acute renal failure suspected secondary to vanco    Medications: Prior to Admission:  (Not in a hospital admission)  Results for orders placed or performed during the hospital encounter of 01/04/15 (from the past 48 hour(s))  Comprehensive metabolic panel     Status: Abnormal   Collection Time: 01/04/15  5:53 PM  Result Value Ref Range   Sodium 124 (L) 135 - 145 mmol/L   Potassium 4.4 3.5 - 5.1 mmol/L   Chloride 89 (L) 101 - 111 mmol/L   CO2 20 (L) 22 - 32 mmol/L   Glucose, Bld 610 (HH) 65 - 99 mg/dL    Comment: CRITICAL RESULT CALLED TO, READ BACK BY AND VERIFIED WITH: C.PRICE,RN 01/04/15 '@1925'  BY V.WILKINS    BUN 32 (H) 6 - 20 mg/dL   Creatinine, Ser 1.85 (H) 0.44 - 1.00 mg/dL    Calcium 8.5 (L) 8.9 - 10.3 mg/dL   Total Protein 6.7 6.5 - 8.1 g/dL   Albumin 2.2 (L) 3.5 - 5.0 g/dL   AST 29 15 - 41 U/L   ALT 23 14 - 54 U/L   Alkaline Phosphatase 181 (H) 38 - 126 U/L   Total Bilirubin 1.3 (H) 0.3 - 1.2 mg/dL   GFR calc non Af Amer 34 (L) >60 mL/min   GFR calc Af Amer 39 (L) >60 mL/min    Comment: (NOTE) The eGFR has been calculated using the CKD EPI equation. This calculation has not been validated in all clinical situations. eGFR's persistently <60 mL/min signify possible Chronic Kidney Disease.    Anion gap 15 5 - 15  CBC     Status: Abnormal   Collection Time: 01/04/15  5:53 PM  Result Value Ref Range   WBC 12.6 (H) 4.0 - 10.5 K/uL   RBC 3.82 (L) 3.87 - 5.11 MIL/uL   Hemoglobin 9.9 (L) 12.0 - 15.0 g/dL   HCT 29.9 (L) 36.0 - 46.0 %   MCV 78.3 78.0 - 100.0 fL   MCH 25.9 (L) 26.0 - 34.0 pg   MCHC 33.1 30.0 - 36.0 g/dL   RDW 13.9 11.5 - 15.5 %   Platelets 236 150 - 400 K/uL  I-Stat CG4 Lactic Acid, ED  (not at [facility])     Status: None   Collection Time: 01/04/15  6:23 PM  Result Value Ref Range   Lactic Acid, Venous 1.85 0.5 - 2.0 mmol/L    No results found.  Review of Systems  Constitutional: Positive for fever and malaise/fatigue.  HENT: Negative.   Eyes: Negative.   Respiratory: Negative.   Cardiovascular: Negative.   Gastrointestinal:       See history of present illness  Genitourinary: Negative.   Musculoskeletal:       See history of present illness  Skin: Negative for rash.  Neurological: Negative.   Endo/Heme/Allergies: Negative.   Psychiatric/Behavioral: Negative.    Blood pressure 128/60, pulse 120, temperature 101.4 F (38.6 C), temperature source Oral, resp. rate 22, height '5\' 10"'  (1.778 m), last menstrual period 11/27/2014, SpO2 97 %. Physical Exam  Constitutional: She is oriented to person, place, and time. She appears well-developed.  Morbidly obese  HENT:  Head: Normocephalic and atraumatic.  Right Ear: External ear  normal.  Left Ear: External ear normal.  Oropharynx somewhat dry  Neck: Neck supple.  Cardiovascular: Normal rate and normal heart sounds.   Respiratory: Effort normal and breath sounds normal. No stridor. No respiratory distress. She has no wheezes.  GI: Soft. She exhibits no distension. There  is no tenderness.  Genitourinary:  Large right-sided perirectal abscess with some skin blistering over the medial aspect and erythema of the medial third of her buttock  Neurological: She is alert and oriented to person, place, and time. She exhibits normal muscle tone.  Psychiatric: She has a normal mood and affect.    Assessment/Plan: Large right perirectal abscess - unfortunately, her blood glucose level is 610 precluding immediate operative drainage. I discussed her case with anesthesia. She also has acute kidney injury. She will be admitted by medicine for treatment of her DKA. Once she is resuscitated and her blood sugar is controlled, we will proceed with incision and drainage of right perirectal abscess in the operating room tomorrow. Recommend IV Zosyn. We will follow along and I will discuss with Dr. Georgette Dover in the AM.  Georganna Skeans E 01/04/2015, 8:06 PM

## 2015-01-04 NOTE — ED Provider Notes (Signed)
Pt seen and evaluated.  D/W PA.  Pt reports gluteal pain "Since Friday".  H/O DM2, now insulin dependent, but without insulin "since about Wednesday".  Exam shows skin breakdown along right gluteal cleft, with induration and fluctuance (probable abscess) along entire buttock from uppermost buttock, extending to, but not involving labia, or perineal tissues.  Will need I&D.  Will keep NPO (last meal 15:30), and d/w surgery regarding consult and +/- CT.  Tanna Furry, MD 01/04/15 [phone]

## 2015-01-04 NOTE — ED Notes (Signed)
CBG was 520.

## 2015-01-04 NOTE — ED Notes (Signed)
Attempted report 

## 2015-01-04 NOTE — Progress Notes (Signed)
ANTIBIOTIC CONSULT NOTE - INITIAL  Pharmacy Consult for zosyn Indication: buttock abscess  Allergies  Allergen Reactions  . Vancomycin Other (See Comments)    Acute renal failure suspected secondary to vanco    Patient Measurements: Height: 5\' 10"  (177.8 cm) IBW/kg (Calculated) : 68.5 Adjusted Body Weight:   Vital Signs: Temp: 101.4 F (38.6 C) (08/14 1732) Temp Source: Oral (08/14 1732) BP: 128/60 mmHg (08/14 1732) Pulse Rate: 120 (08/14 1732) Intake/Output from previous day:   Intake/Output from this shift:    Labs:  Recent Labs  01/04/15 1753  WBC 12.6*  HGB 9.9*  PLT 236  CREATININE 1.85*   CrCl cannot be calculated (Unknown ideal weight.). No results for input(s): VANCOTROUGH, VANCOPEAK, VANCORANDOM, GENTTROUGH, GENTPEAK, GENTRANDOM, TOBRATROUGH, TOBRAPEAK, TOBRARND, AMIKACINPEAK, AMIKACINTROU, AMIKACIN in the last 72 hours.   Microbiology: No results found for this or any previous visit (from the past 720 hour(s)).  Medical History: Past Medical History  Diagnosis Date  . Hyperlipidemia   . Hypertension   . Abscess     history of multiple abscesses  . Obesity   . Peripheral neuropathy   . Abnormal Pap smear of cervix 2009  . Endocarditis 2002    subacute bacterial endocarditis.   . Edema of lower extremity   . Family history of anesthesia complication     "my mom has a hard time coming out from under"  . Heart murmur   . Chronic bronchitis     "get it q yr" (05/13/2013)  . OSA on CPAP   . Hypothyroidism   . Type II diabetes mellitus   . Anemia of chronic disease 2002  . History of blood transfusion     "just low blood count" (05/13/2013)  . GERD (gastroesophageal reflux disease)   . Chronic pain   . Anxiety   . Depression   . Bipolar disorder   . CHF (congestive heart failure)     pt denies this hx on 05/13/2013  . Hypothyroidism, adult 03/21/2014  . Cellulitis 05/21/2014    right eye    Medications:  Scheduled:  . acetaminophen        Infusions:  . dextrose 5 % and 0.45% NaCl    . insulin (NOVOLIN-R) infusion 5.4 Units/hr (01/04/15 2017)  . sodium chloride     Assessment: 39 yo female with buttock abscess will be started on zosyn.  SCr 1.85.  Goal of Therapy:  Resolution of infection  Plan:  - zosyn 3.375g iv q8h (4h infusion)   Pachia Strum, Tsz-Yin 01/04/2015,8:51 PM

## 2015-01-04 NOTE — ED Notes (Signed)
Pt reports having a boil on her buttock that is causing severe pain and fever.

## 2015-01-04 NOTE — H&P (Signed)
Date: 01/04/2015               Patient Name:  [name] [name] MRN: [id number]  DOB: 14-Feb-1976 Age / Sex: 39 y.o., female   PCP: Milagros Loll, MD         Medical Service: Internal Medicine Teaching Service         Attending Physician: Dr. Annia Belt, MD    First Contact: Dr. Tiburcio Pea Pager: [phone]  Second Contact: Dr. Heber Hurdland Pager: [phone]       After Hours (After 5p/  First Contact Pager: [phone]  weekends / holidays): Second Contact Pager: [phone]   Chief Complaint: Weakness, fatigue  History of Present Illness: [name] is a 39 y.o. female w/ PMHx of DM type II, Anemia of Chronic Disease, Hypothyroidism, GERD, anxiety, depression, and h/o medication non-compliance, presents to the ED w/ complaints of right buttock pain and drainage. Patient states she started having pain back on Wednesday (5 days ago) and felt that she was developing an infection. She noticed that the area became larger and more and more painful and became unbearable today causing her to come to the ED. She also noticed the area started to drain as well. She states she is unable to sit d/t the pain. She also endorses subjective fever, chills, nausea, and diaphoresis. Patient states she has had this in the past before as well. Ms. Templet has very poorly controlled DM type II and states that she has not taken any of her medications in the past month. She has checked her CBG's at home a few times in the recent past and says it is typically in the 300-400 range. She has been using a bit of Novolog from time to time, but otherwise has not been taking anything else. She denies polyuria or polydipsia.   While in the ED, patient was noted to have a very large right buttock abscess extending anteriorl into her perineum. She was also noted to have a low grade fever (101.4), tachycardia, and mild leukocytosis. CBG was found to be 610, HCO3 20, w/ high normal AG of 15. Cr increased from baseline to  1.85. Started on Clindamycin (Vancomycin allergy) and Zosyn, Insulin gtt, and srguery was consulted.    Meds: Current Facility-Administered Medications  Medication Dose Route Frequency Provider Last Rate Last Dose  . 0.9 %  sodium chloride infusion   Intravenous Continuous Corky Sox, MD      . acetaminophen (TYLENOL) 325 MG tablet           . clindamycin (CLEOCIN) IVPB 600 mg  600 mg Intravenous Q8H Corky Sox, MD      . dextrose 5 %-0.45 % sodium chloride infusion   Intravenous Continuous Corky Sox, MD      . Derrill Memo ON 01/05/2015] FLUoxetine (PROZAC) tablet 20 mg  20 mg Oral Daily Corky Sox, MD      . heparin injection 5,000 Units  5,000 Units Subcutaneous 3 times per day Corky Sox, MD      . insulin regular (NOVOLIN R,HUMULIN R) 250 Units in sodium chloride 0.9 % 250 mL (1 Units/mL) infusion   Intravenous Continuous Corky Sox, MD      . Derrill Memo ON 01/05/2015] levothyroxine (SYNTHROID, LEVOTHROID) tablet 25 mcg  25 mcg Oral QAC breakfast Corky Sox, MD      . ondansetron [facility]) tablet 4 mg  4 mg Oral Q6H PRN Corky Sox, MD  Or  . ondansetron (ZOFRAN) injection 4 mg  4 mg Intravenous Q6H PRN Corky Sox, MD      . piperacillin-tazobactam (ZOSYN) IVPB 3.375 g  3.375 g Intravenous Q8H Annia Belt, MD      . potassium chloride 10 mEq in 100 mL IVPB  10 mEq Intravenous Q1H Corky Sox, MD      . Derrill Memo ON 01/05/2015] rosuvastatin (CRESTOR) tablet 10 mg  10 mg Oral QHS Corky Sox, MD      . sodium chloride 0.9 % injection 3 mL  3 mL Intravenous Q12H Corky Sox, MD        Allergies: Allergies as of 01/04/2015 - Review Complete 01/04/2015  Allergen Reaction Noted  . Vancomycin Other (See Comments) 08/24/2012   Past Medical History  Diagnosis Date  . Hyperlipidemia   . Hypertension   . Abscess     history of multiple abscesses  . Obesity   . Peripheral neuropathy   . Abnormal Pap smear of cervix 2009  . Endocarditis 2002    subacute bacterial  endocarditis.   . Edema of lower extremity   . Family history of anesthesia complication     "my mom has a hard time coming out from under"  . Heart murmur   . Chronic bronchitis     "get it q yr" (05/13/2013)  . OSA on CPAP   . Hypothyroidism   . Type II diabetes mellitus   . Anemia of chronic disease 2002  . History of blood transfusion     "just low blood count" (05/13/2013)  . GERD (gastroesophageal reflux disease)   . Chronic pain   . Anxiety   . Depression   . Bipolar disorder   . CHF (congestive heart failure)     pt denies this hx on 05/13/2013  . Hypothyroidism, adult 03/21/2014  . Cellulitis 05/21/2014    right eye   Past Surgical History  Procedure Laterality Date  . Incision and drainage abscess      multiple I&Ds  . Irrigation and debridement abscess Left 07/06/2012    Procedure: IRRIGATION AND DEBRIDEMENT ABSCESS BUTTOCKS AND THIGH;  Surgeon: Shann Medal, MD;  Location: [place];  Service: General;  Laterality: Left;  . Incision and drainage of wound Left 07/07/2012    Procedure: IRRIGATION AND DEBRIDEMENT WOUND;  Surgeon: Harl Bowie, MD;  Location: [place];  Service: General;  Laterality: Left;  . Incision and drainage abscess Left 07/09/2012    Procedure: DRESSING CHANGE, THIGH WOUND;  Surgeon: Harl Bowie, MD;  Location: [place];  Service: General;  Laterality: Left;  . Incision and drainage perirectal abscess Left 07/14/2012    Procedure: DEBRIDEMENT OF SKIN & SOFT TISSUE; DRESSING CHANGE UNDER ANESTHESIA;  Surgeon: Gayland Curry, MD,FACS;  Location: [place];  Service: General;  Laterality: Left;  . Incision and drainage perirectal abscess Left 07/16/2012    Procedure: I&D Left Thigh;  Surgeon: Gwenyth Ober, MD;  Location: [place];  Service: General;  Laterality: Left;  . Irrigation and debridement abscess Left 08/10/2012    Procedure: IRRIGATION AND DEBRIDEMENT ABSCESS;  Surgeon: Madilyn Hook, DO;  Location: [place];  Service: General;  Laterality: Left;  .  Tonsillectomy  1994   Family History  Problem Relation Age of Onset  . Heart failure Mother   . Diabetes Mother   . Kidney disease Mother   . Kidney disease Father   . Diabetes Paternal Grandmother   .  Heart failure Paternal Grandmother    Social History   Social History  . Marital Status: Single    Spouse Name: N/A  . Number of Children: N/A  . Years of Education: N/A   Occupational History  . telemarketer     Phone surveys   Social History Main Topics  . Smoking status: Current Every Day Smoker -- 0.10 packs/day for 6 years    Types: Cigarettes  . Smokeless tobacco: Never Used     Comment: 7-8 cigarettes a day  . Alcohol Use: No  . Drug Use: No  . Sexual Activity: Not Currently   Other Topics Concern  . Not on file   Social History Narrative   Lives with mother currently,     Review of Systems: Review of Systems  General: Positive for subjective fever, chills, decreased appetite, diaphoresis, and fatigue.  Respiratory: Denies SOB, cough, and wheezing.   Cardiovascular: Denies chest pain and palpitations.  Gastrointestinal: Positive for nausea. Denies vomiting, abdominal pain, and diarrhea Musculoskeletal: Denies myalgias, arthralgias, back pain, and gait problem.  Neurological: Denies dizziness, syncope, weakness, lightheadedness, and headaches.  Psychiatric/Behavioral: Denies mood changes, sleep disturbance, and agitation.  Physical Exam: Blood pressure 105/51, pulse 101, temperature 98.9 F (37.2 C), temperature source Oral, resp. rate 20, height 5\' 10"  (1.778 m), last menstrual period 11/27/2014, SpO2 99 %.  General: Obese AA female, alert, cooperative, NAD. Diaphoretic.  HEENT: PERRL, EOMI. Moist mucus membranes Neck: Full range of motion without pain, supple, no lymphadenopathy or carotid bruits Lungs: Clear to ascultation bilaterally, normal work of respiration, no wheezes, rales, rhonchi Heart: tachcyardic, 2/6 systolic murmur. No gallops, or  rubs Abdomen: Soft, non-tender, non-distended, BS + Extremities: No cyanosis, clubbing, or edema. Large area of erythema and significant induration on right buttock (~20 cm x 12 cm) extending anterior to include perineum. Does not involve vulva/labia or anus. Large central area of skin sloughing and drainage (~4 cm x 5 cm). No obvious fluctuance noted. Quite tender to palpation. Foul smelling.  Neurologic: Alert & oriented x3, cranial nerves II-XII intact, strength grossly intact, sensation intact to light touch    Lab results: Basic Metabolic Panel:  Recent Labs  01/04/15 1753  NA 124*  K 4.4  CL 89*  CO2 20*  GLUCOSE 610*  BUN 32*  CREATININE 1.85*  CALCIUM 8.5*   Liver Function Tests:  Recent Labs  01/04/15 1753  AST 29  ALT 23  ALKPHOS 181*  BILITOT 1.3*  PROT 6.7  ALBUMIN 2.2*   CBC:  Recent Labs  01/04/15 1753  WBC 12.6*  HGB 9.9*  HCT 29.9*  MCV 78.3  PLT 236   CBG:  Recent Labs  01/04/15 2117  GLUCAP 520*   Urine Drug Screen: Drugs of Abuse     Component Value Date/Time   LABOPIA NONE DETECTED 02/26/2013 1907   COCAINSCRNUR NONE DETECTED 02/26/2013 1907   LABBENZ NONE DETECTED 02/26/2013 1907   AMPHETMU NONE DETECTED 02/26/2013 1907   THCU NONE DETECTED 02/26/2013 1907   LABBARB NONE DETECTED 02/26/2013 1907    Other results: EKG: pending  Assessment & Plan by Problem: Active Problems:   Hyperglycemia   Abscess of right buttock  Abcess of right buttock -likely related to her poorly controlled diabetes.  Surgery has seen the patient with plan to I&D her abcess tomorrow once her hyperglycemia is under control -noted to have a low grade fever (101.4), tachycardia, and mild leukocytosis -clindamycin 600mg  IV q8h and Zosyn 3.375g IV q8h.  Patient has allergy to vancomycin -morphine 2mg  IV q4h prn for pain -benadryl 25mg  PO q6h prn for itching 2/2 morphine -zofran 4mg  q6h prn for nausea  Hyperglycemia/mild DKA -glucose of 610 on  admission with mild acidosis and anion gap of 15. -patient with a history of poor compliance to her medications.  Last Hgb A1c of 12.4 in December 2015. -Home meds include: Novolog 7 units tid with meals, Lantus 23 units qhs, metformin 500mg  daily.  She is non-compliant with her meds -NS IV fluids @ 122mL/hr after NS IV fluid 1039mL bolus.  Transition to D5-1/2NS when CBG <250 -KCl 57mEq x 2 -insulin drip -BMP q4h -hourly CBG -Hgb A1c, magnesium in the AM -consult to care management placed.  Patient appears to have lack of insight into her medical problems and claims inability to afford insulin and other medications  AKI -Creatine of 1.85 on admission -IV fluids as above -BMP q4h as above  Hypertension -on HCTZ 25mg  daily and Benicar 20mg  daily. -held for now given her soft BP  Hyperlipidemia -continue Crestor 10mg  daily  Hypothyroidism -TSH pending with last being 6.3 in February 2016 -continue Synthroid 64mcg daily  Iron deficiency anemia -anemia panel from February 2016: iron 36, TIBC 243, ferritin 96, folate 16, vitamin b12 411 -contributing factors could be hypothyroidism, anemia chronic disease, history of heavy menses.  Patient was referred to [facility] for evaluation of heavy menses in February 2016 but does not appear to have followed up with that  Depression -continue Prozac 20mg  daily  Diet: NPO  DVT: heparin  Code: Full   Dispo: Disposition is deferred at this time, awaiting improvement of current medical problems. Anticipated discharge in approximately 2-3 day(s).   The patient does have a current PCP Milagros Loll, MD) and does need an [facility] hospital follow-up appointment after discharge.  The patient does not have transportation limitations that hinder transportation to clinic appointments.  Signed: Jule Ser, DO 01/04/2015, 10:51 PM   Medicine attending admission note: I personally interviewed and examined this patient, reviewed pertinent  clinical, laboratory,  data, and I attest to the accuracy of the evaluation and management plan as recorded by resident physician Dr. Jule Ser.  Clinical summary: 39 year old obese, poorly controlled and poorly compliant type II diabetic, who presents with a five-day history of increasing right buttock pain with associated drainage of foul-smelling liquid, subjective fevers, chills, nausea, and uncontrolled sugars. She has a history of recurrent buttock and thigh abscesses dating back to  February 2014 and has required multiple surgical incision, drainage, irrigation, and debridement procedures between February and March 2014. On presentation to the emergency department she was found to have a large right buttock abscess extending anteriorly into the perineal area. Foul-smelling drainage. Fever to 101.4. Tachycardia. Blood sugar 610. Serum bicarbonate 20. Anion gap 15. Lactic acid normal at 1.8 5 repeat 1.08. White blood count elevated 12,600.  She is allergic to vancomycin. Cultures were obtained. She was started on clindamycin and Zosyn. Insulin infusion initiated. Potassium replaced. She was seen in consultation by general surgery who confirmed a large right-sided perirectal abscess with skin blistering over the medial aspect involving one third of her buttock. Immediate operative drainage deferred until better control of her glucose.  Initial exam by Dr. Juleen China: Blood pressure 128/60, pulse 120, temperature 101.4 F (38.6 C), temperature source Oral, resp. rate 22, height 5\' 10"  (1.778 m), last menstrual period 11/27/2014, SpO2 97 %. Large right-sided perirectal abscess with some skin blistering over the medial aspect  and erythema of the medial third of her buttock   Current exam: Blood pressure 115/82, pulse 111, temperature 98.9 F (37.2 C), temperature source Oral, resp. rate 34, height 5\' 10"  (1.778 m), weight 138 kg (304 lb 3.8 oz), last menstrual period 11/27/2014, SpO2 97 %./ No  change compared with the exam recorded by Dr. Juleen China. There is a central area of ulceration on the skin of the right buttock with a necrotic border. No cardiac murmur.  Pertinent lab: See above. Sodium 124 with glucose 610;  BUN 32, creatinine 1.85 compared with baseline BUN 15, creatinine 0.9 recorded December 2015. Alkaline phosphatase 181, total bilirubin 1.3. Normal transaminases. Albumin decreased 2.2 g percent. Impression: #1. Recurrent soft tissue abscess right buttock #2. Uncontrolled diabetes with early ketoacidosis secondary to infection and medical noncompliance  Plan as outlined above by Dr. Juleen China.   Murriel Hopper, MD, [place]  Hematology-Oncology/Internal Medicine

## 2015-01-05 ENCOUNTER — Encounter (HOSPITAL_COMMUNITY): Payer: Self-pay | Admitting: *Deleted

## 2015-01-05 ENCOUNTER — Encounter (HOSPITAL_COMMUNITY)
Admission: EM | Disposition: A | Discharge status: Home Health | Payer: Self-pay | Source: Home / Self Care | Attending: Oncology

## 2015-01-05 ENCOUNTER — Inpatient Hospital Stay (HOSPITAL_COMMUNITY): Payer: Self-pay | Admitting: Anesthesiology

## 2015-01-05 HISTORY — PX: INCISION AND DRAINAGE PERIRECTAL ABSCESS: SHX1804

## 2015-01-05 LAB — BASIC METABOLIC PANEL
Anion gap: 11 (ref 5–15)
Anion gap: 12 (ref 5–15)
Anion gap: 13 (ref 5–15)
BUN: 24 mg/dL — AB (ref 6–20)
BUN: 25 mg/dL — AB (ref 6–20)
BUN: 26 mg/dL — AB (ref 6–20)
CHLORIDE: 100 mmol/L — AB (ref 101–111)
CHLORIDE: 100 mmol/L — AB (ref 101–111)
CHLORIDE: 94 mmol/L — AB (ref 101–111)
CO2: 21 mmol/L — AB (ref 22–32)
CO2: 22 mmol/L (ref 22–32)
CO2: 22 mmol/L (ref 22–32)
Calcium: 8.4 mg/dL — ABNORMAL LOW (ref 8.9–10.3)
Calcium: 8.5 mg/dL — ABNORMAL LOW (ref 8.9–10.3)
Calcium: 8.6 mg/dL — ABNORMAL LOW (ref 8.9–10.3)
Creatinine, Ser: 1.25 mg/dL — ABNORMAL HIGH (ref 0.44–1.00)
Creatinine, Ser: 1.27 mg/dL — ABNORMAL HIGH (ref 0.44–1.00)
Creatinine, Ser: 1.48 mg/dL — ABNORMAL HIGH (ref 0.44–1.00)
GFR calc Af Amer: 51 mL/min — ABNORMAL LOW (ref >60)
GFR calc Af Amer: >60 mL/min (ref >60)
GFR calc Af Amer: >60 mL/min (ref >60)
GFR calc non Af Amer: 44 mL/min — ABNORMAL LOW (ref >60)
GFR calc non Af Amer: 53 mL/min — ABNORMAL LOW (ref >60)
GFR calc non Af Amer: 54 mL/min — ABNORMAL LOW (ref >60)
GLUCOSE: 182 mg/dL — AB (ref 65–99)
GLUCOSE: 294 mg/dL — AB (ref 65–99)
GLUCOSE: 408 mg/dL — AB (ref 65–99)
POTASSIUM: 3.6 mmol/L (ref 3.5–5.1)
POTASSIUM: 4 mmol/L (ref 3.5–5.1)
POTASSIUM: 4.1 mmol/L (ref 3.5–5.1)
Sodium: 129 mmol/L — ABNORMAL LOW (ref 135–145)
Sodium: 133 mmol/L — ABNORMAL LOW (ref 135–145)
Sodium: 133 mmol/L — ABNORMAL LOW (ref 135–145)

## 2015-01-05 LAB — GLUCOSE, CAPILLARY
GLUCOSE-CAPILLARY: 321 mg/dL — AB (ref 65–99)
GLUCOSE-CAPILLARY: 308 mg/dL — AB (ref 65–99)
GLUCOSE-CAPILLARY: 227 mg/dL — AB (ref 65–99)
GLUCOSE-CAPILLARY: 176 mg/dL — AB (ref 65–99)
GLUCOSE-CAPILLARY: 170 mg/dL — AB (ref 65–99)
GLUCOSE-CAPILLARY: 191 mg/dL — AB (ref 65–99)
GLUCOSE-CAPILLARY: 158 mg/dL — AB (ref 65–99)
GLUCOSE-CAPILLARY: 169 mg/dL — AB (ref 65–99)
GLUCOSE-CAPILLARY: 367 mg/dL — AB (ref 65–99)
Glucose-Capillary: 350 mg/dL — ABNORMAL HIGH (ref 65–99)
Glucose-Capillary: 374 mg/dL — ABNORMAL HIGH (ref 65–99)
Glucose-Capillary: 180 mg/dL — ABNORMAL HIGH (ref 65–99)
Glucose-Capillary: 167 mg/dL — ABNORMAL HIGH (ref 65–99)
Glucose-Capillary: 230 mg/dL — ABNORMAL HIGH (ref 65–99)

## 2015-01-05 LAB — CBC
HEMATOCRIT: 30.1 % — AB (ref 36.0–46.0)
Hemoglobin: 10.1 g/dL — ABNORMAL LOW (ref 12.0–15.0)
MCH: 26.1 pg (ref 26.0–34.0)
MCHC: 33.6 g/dL (ref 30.0–36.0)
MCV: 77.8 fL — ABNORMAL LOW (ref 78.0–100.0)
PLATELETS: 222 10*3/uL (ref 150–400)
RBC: 3.87 MIL/uL (ref 3.87–5.11)
RDW: 13.9 % (ref 11.5–15.5)
WBC: 13 10*3/uL — AB (ref 4.0–10.5)

## 2015-01-05 LAB — PREGNANCY, URINE: PREG TEST UR: NEGATIVE

## 2015-01-05 LAB — MRSA PCR SCREENING: MRSA BY PCR: NEGATIVE

## 2015-01-05 LAB — MAGNESIUM: Magnesium: 2.5 mg/dL — ABNORMAL HIGH (ref 1.7–2.4)

## 2015-01-05 LAB — TSH: TSH: 4.046 u[IU]/mL (ref 0.350–4.500)

## 2015-01-05 SURGERY — INCISION AND DRAINAGE, ABSCESS, PERIRECTAL
Anesthesia: General | Site: Buttocks

## 2015-01-05 MED ORDER — HYDROMORPHONE HCL 1 MG/ML IJ SOLN
1.0000 mg | INTRAMUSCULAR | Status: DC | PRN
  Administered 2015-01-05 – 2015-01-06 (×3): 1 mg via INTRAVENOUS
  Filled 2015-01-05 (×3): qty 1

## 2015-01-05 MED ORDER — ROCURONIUM BROMIDE 100 MG/10ML IV SOLN
INTRAVENOUS | Status: DC | PRN
  Administered 2015-01-05: 40 mg via INTRAVENOUS

## 2015-01-05 MED ORDER — FENTANYL CITRATE (PF) 100 MCG/2ML IJ SOLN
INTRAMUSCULAR | Status: DC | PRN
  Administered 2015-01-05 (×5): 50 ug via INTRAVENOUS

## 2015-01-05 MED ORDER — LACTATED RINGERS IV SOLN
INTRAVENOUS | Status: DC
  Administered 2015-01-05: 12:00:00 via INTRAVENOUS

## 2015-01-05 MED ORDER — INSULIN ASPART 100 UNIT/ML ~~LOC~~ SOLN
0.0000 [IU] | Freq: Every day | SUBCUTANEOUS | Status: DC
  Administered 2015-01-05: 5 [IU] via SUBCUTANEOUS

## 2015-01-05 MED ORDER — 0.9 % SODIUM CHLORIDE (POUR BTL) OPTIME
TOPICAL | Status: DC | PRN
  Administered 2015-01-05 (×2): 1000 mL

## 2015-01-05 MED ORDER — LIDOCAINE HCL (CARDIAC) 20 MG/ML IV SOLN
INTRAVENOUS | Status: DC | PRN
  Administered 2015-01-05: 80 mg via INTRAVENOUS

## 2015-01-05 MED ORDER — DIPHENHYDRAMINE HCL 25 MG PO CAPS
25.0000 mg | ORAL_CAPSULE | Freq: Four times a day (QID) | ORAL | Status: DC | PRN
  Administered 2015-01-05 – 2015-01-06 (×2): 25 mg via ORAL
  Filled 2015-01-05 (×2): qty 1

## 2015-01-05 MED ORDER — INSULIN ASPART 100 UNIT/ML ~~LOC~~ SOLN: 0.0000 [IU] | Freq: Three times a day (TID) | SUBCUTANEOUS | Status: DC

## 2015-01-05 MED ORDER — POTASSIUM CHLORIDE 10 MEQ/100ML IV SOLN
10.0000 meq | Freq: Once | INTRAVENOUS | Status: AC
  Administered 2015-01-05: 10 meq via INTRAVENOUS
  Filled 2015-01-05: qty 100

## 2015-01-05 MED ORDER — HYDROMORPHONE HCL 1 MG/ML IJ SOLN
0.2500 mg | INTRAMUSCULAR | Status: DC | PRN
  Administered 2015-01-05 (×3): 0.5 mg via INTRAVENOUS

## 2015-01-05 MED ORDER — DEXAMETHASONE SODIUM PHOSPHATE 4 MG/ML IJ SOLN
INTRAMUSCULAR | Status: AC
  Filled 2015-01-05: qty 1

## 2015-01-05 MED ORDER — GLYCOPYRROLATE 0.2 MG/ML IJ SOLN
INTRAMUSCULAR | Status: AC
  Filled 2015-01-05: qty 4

## 2015-01-05 MED ORDER — MIDAZOLAM HCL 2 MG/2ML IJ SOLN
INTRAMUSCULAR | Status: AC
  Filled 2015-01-05: qty 4

## 2015-01-05 MED ORDER — POTASSIUM CHLORIDE CRYS ER 20 MEQ PO TBCR
40.0000 meq | EXTENDED_RELEASE_TABLET | Freq: Once | ORAL | Status: AC
  Administered 2015-01-05: 40 meq via ORAL
  Filled 2015-01-05: qty 2

## 2015-01-05 MED ORDER — NEOSTIGMINE METHYLSULFATE 10 MG/10ML IV SOLN
INTRAVENOUS | Status: DC | PRN
  Administered 2015-01-05: 4 mg via INTRAVENOUS

## 2015-01-05 MED ORDER — FENTANYL CITRATE (PF) 250 MCG/5ML IJ SOLN
INTRAMUSCULAR | Status: AC
  Filled 2015-01-05: qty 5

## 2015-01-05 MED ORDER — ONDANSETRON HCL 4 MG/2ML IJ SOLN
INTRAMUSCULAR | Status: AC
  Filled 2015-01-05: qty 2

## 2015-01-05 MED ORDER — MIDAZOLAM HCL 5 MG/5ML IJ SOLN
INTRAMUSCULAR | Status: DC | PRN
  Administered 2015-01-05: 2 mg via INTRAVENOUS

## 2015-01-05 MED ORDER — INSULIN ASPART 100 UNIT/ML ~~LOC~~ SOLN
4.0000 [IU] | Freq: Three times a day (TID) | SUBCUTANEOUS | Status: DC
  Administered 2015-01-05 – 2015-01-06 (×3): 4 [IU] via SUBCUTANEOUS

## 2015-01-05 MED ORDER — LIDOCAINE HCL (CARDIAC) 20 MG/ML IV SOLN
INTRAVENOUS | Status: AC
  Filled 2015-01-05: qty 5

## 2015-01-05 MED ORDER — SODIUM CHLORIDE 0.9 % IV SOLN
INTRAVENOUS | Status: DC | PRN
  Administered 2015-01-05: 3000 mL

## 2015-01-05 MED ORDER — OXYCODONE-ACETAMINOPHEN 5-325 MG PO TABS
1.0000 | ORAL_TABLET | Freq: Once | ORAL | Status: AC
  Administered 2015-01-05: 1 via ORAL
  Filled 2015-01-05: qty 1

## 2015-01-05 MED ORDER — GLYCOPYRROLATE 0.2 MG/ML IJ SOLN
INTRAMUSCULAR | Status: DC | PRN
  Administered 2015-01-05: 0.6 mg via INTRAVENOUS

## 2015-01-05 MED ORDER — INSULIN ASPART 100 UNIT/ML ~~LOC~~ SOLN
0.0000 [IU] | Freq: Three times a day (TID) | SUBCUTANEOUS | Status: DC
  Administered 2015-01-05: 3 [IU] via SUBCUTANEOUS

## 2015-01-05 MED ORDER — CLINDAMYCIN PHOSPHATE 600 MG/50ML IV SOLN
600.0000 mg | Freq: Three times a day (TID) | INTRAVENOUS | Status: DC
  Filled 2015-01-05 (×3): qty 50

## 2015-01-05 MED ORDER — ONDANSETRON HCL 4 MG/2ML IJ SOLN
INTRAMUSCULAR | Status: DC | PRN
  Administered 2015-01-05: 4 mg via INTRAVENOUS

## 2015-01-05 MED ORDER — LACTATED RINGERS IV SOLN: INTRAVENOUS | Status: DC

## 2015-01-05 MED ORDER — ROCURONIUM BROMIDE 50 MG/5ML IV SOLN
INTRAVENOUS | Status: AC
  Filled 2015-01-05: qty 1

## 2015-01-05 MED ORDER — LACTATED RINGERS IV SOLN
INTRAVENOUS | Status: DC | PRN
  Administered 2015-01-05: 12:00:00 via INTRAVENOUS

## 2015-01-05 MED ORDER — PIPERACILLIN-TAZOBACTAM 3.375 G IVPB
3.3750 g | Freq: Three times a day (TID) | INTRAVENOUS | Status: DC
  Administered 2015-01-05 – 2015-01-06 (×3): 3.375 g via INTRAVENOUS
  Filled 2015-01-05 (×6): qty 50

## 2015-01-05 MED ORDER — PROPOFOL 10 MG/ML IV BOLUS
INTRAVENOUS | Status: AC
  Filled 2015-01-05: qty 20

## 2015-01-05 MED ORDER — DEXAMETHASONE SODIUM PHOSPHATE 4 MG/ML IJ SOLN
INTRAMUSCULAR | Status: DC | PRN
  Administered 2015-01-05: 4 mg via INTRAVENOUS

## 2015-01-05 MED ORDER — ARTIFICIAL TEARS OP OINT
TOPICAL_OINTMENT | OPHTHALMIC | Status: DC | PRN
  Administered 2015-01-05: 1 via OPHTHALMIC

## 2015-01-05 MED ORDER — HYDROMORPHONE HCL 1 MG/ML IJ SOLN
INTRAMUSCULAR | Status: AC
  Filled 2015-01-05: qty 1

## 2015-01-05 MED ORDER — PROPOFOL 10 MG/ML IV BOLUS
INTRAVENOUS | Status: DC | PRN
  Administered 2015-01-05: 150 mg via INTRAVENOUS

## 2015-01-05 MED ORDER — PROMETHAZINE HCL 25 MG/ML IJ SOLN: 6.2500 mg | INTRAMUSCULAR | Status: DC | PRN

## 2015-01-05 MED ORDER — MEPERIDINE HCL 25 MG/ML IJ SOLN: 6.2500 mg | INTRAMUSCULAR | Status: DC | PRN

## 2015-01-05 MED ORDER — INSULIN GLARGINE 100 UNIT/ML ~~LOC~~ SOLN
20.0000 [IU] | Freq: Every day | SUBCUTANEOUS | Status: DC
  Administered 2015-01-05: 20 [IU] via SUBCUTANEOUS
  Filled 2015-01-05 (×2): qty 0.2

## 2015-01-05 SURGICAL SUPPLY — 38 items
BNDG GAUZE ELAST 4 BULKY (GAUZE/BANDAGES/DRESSINGS) ×2 IMPLANT
COVER MAYO STAND STRL (DRAPES) ×3 IMPLANT
COVER SURGICAL LIGHT HANDLE (MISCELLANEOUS) ×3 IMPLANT
DRAPE LAPAROSCOPIC ABDOMINAL (DRAPES) ×2 IMPLANT
DRAPE UTILITY XL STRL (DRAPES) ×6 IMPLANT
DRSG PAD ABDOMINAL 8X10 ST (GAUZE/BANDAGES/DRESSINGS) ×4 IMPLANT
ELECT CAUTERY BLADE 6.4 (BLADE) ×3 IMPLANT
ELECT REM PT RETURN 9FT ADLT (ELECTROSURGICAL) ×3
ELECTRODE REM PT RTRN 9FT ADLT (ELECTROSURGICAL) ×1 IMPLANT
GAUZE SPONGE 4X4 12PLY STRL (GAUZE/BANDAGES/DRESSINGS) IMPLANT
GLOVE BIO SURGEON STRL SZ7 (GLOVE) ×7 IMPLANT
GLOVE BIOGEL PI IND STRL 6.5 (GLOVE) IMPLANT
GLOVE BIOGEL PI IND STRL 7.5 (GLOVE) ×1 IMPLANT
GLOVE BIOGEL PI INDICATOR 6.5 (GLOVE) ×2
GLOVE BIOGEL PI INDICATOR 7.5 (GLOVE) ×4
GOWN STRL REUS W/ TWL LRG LVL3 (GOWN DISPOSABLE) ×2 IMPLANT
GOWN STRL REUS W/TWL LRG LVL3 (GOWN DISPOSABLE) ×6
HANDPIECE INTERPULSE COAX TIP (DISPOSABLE) ×3
KIT BASIN OR (CUSTOM PROCEDURE TRAY) ×3 IMPLANT
KIT ROOM TURNOVER OR (KITS) ×3 IMPLANT
NS IRRIG 1000ML POUR BTL (IV SOLUTION) ×3 IMPLANT
PACK GENERAL/GYN (CUSTOM PROCEDURE TRAY) ×2 IMPLANT
PACK LITHOTOMY IV (CUSTOM PROCEDURE TRAY) ×1 IMPLANT
PAD ARMBOARD 7.5X6 YLW CONV (MISCELLANEOUS) ×6 IMPLANT
PENCIL BUTTON HOLSTER BLD 10FT (ELECTRODE) ×3 IMPLANT
SET HNDPC FAN SPRY TIP SCT (DISPOSABLE) IMPLANT
SPONGE LAP 18X18 X RAY DECT (DISPOSABLE) ×3 IMPLANT
SURGILUBE 2OZ TUBE FLIPTOP (MISCELLANEOUS) IMPLANT
SWAB COLLECTION DEVICE MRSA (MISCELLANEOUS) ×2 IMPLANT
SYR BULB 3OZ (MISCELLANEOUS) ×3 IMPLANT
TAPE CLOTH SURG 6X10 WHT LF (GAUZE/BANDAGES/DRESSINGS) ×2 IMPLANT
TOWEL OR 17X24 6PK STRL BLUE (TOWEL DISPOSABLE) ×3 IMPLANT
TOWEL OR 17X26 10 PK STRL BLUE (TOWEL DISPOSABLE) ×3 IMPLANT
TUBE ANAEROBIC SPECIMEN COL (MISCELLANEOUS) ×2 IMPLANT
TUBE CONNECTING 12'X1/4 (SUCTIONS) ×1
TUBE CONNECTING 12X1/4 (SUCTIONS) ×2 IMPLANT
WATER STERILE IRR 1000ML POUR (IV SOLUTION) IMPLANT
YANKAUER SUCT BULB TIP NO VENT (SUCTIONS) ×3 IMPLANT

## 2015-01-05 NOTE — Progress Notes (Signed)
Day of Surgery  Subjective: Still quite tender in right buttock Blood sugars much better controlled  Objective: Vital signs in last 24 hours: Temp:  [98.3 F (36.8 C)-101.4 F (38.6 C)] 98.7 F (37.1 C) (08/14 2349) Pulse Rate:  [101-120] 106 (08/15 0223) Resp:  [16-22] 20 (08/15 0223) BP: (104-128)/(51-82) 104/66 mmHg (08/15 0223) SpO2:  [97 %-100 %] 100 % (08/15 0223) Weight:  [138 kg (304 lb 3.8 oz)] 138 kg (304 lb 3.8 oz) (08/14 2130)    Intake/Output from previous day: 08/14 0701 - 08/15 0700 In: 830.9 [I.V.:780.9; IV Piggyback:50] Out: [id number] [Urine:1125] Intake/Output this shift:    Right buttock - thick, indurated, tender over medial aspect   Lab Results:   Recent Labs  01/04/15 1753  WBC 12.6*  HGB 9.9*  HCT 29.9*  PLT 236   BMET  Recent Labs  01/05/15 0226 01/05/15 0527  NA 133* 133*  K 4.0 4.1  CL 100* 100*  CO2 21* 22  GLUCOSE 294* 182*  BUN 25* 24*  CREATININE 1.27* 1.25*  CALCIUM 8.6* 8.5*   PT/INR No results for input(s): LABPROT, INR in the last 72 hours. ABG No results for input(s): PHART, HCO3 in the last 72 hours.  Invalid input(s): PCO2, PO2  Studies/Results: No results found.  Anti-infectives: Anti-infectives    Start     Dose/Rate Route Frequency Ordered Stop   01/05/15 1000  piperacillin-tazobactam (ZOSYN) IVPB 3.375 g     3.375 g 12.5 mL/hr over 240 Minutes Intravenous Every 8 hours 01/05/15 0358     01/05/15 1000  clindamycin (CLEOCIN) IVPB 600 mg     600 mg 100 mL/hr over 30 Minutes Intravenous Every 8 hours 01/05/15 0401     01/04/15 2200  piperacillin-tazobactam (ZOSYN) IVPB 3.375 g  Status:  Discontinued     3.375 g 12.5 mL/hr over 240 Minutes Intravenous Every 8 hours 01/04/15 2053 01/05/15 0358   01/04/15 2200  clindamycin (CLEOCIN) IVPB 600 mg  Status:  Discontinued     600 mg 100 mL/hr over 30 Minutes Intravenous Every 8 hours 01/04/15 2100 01/05/15 0401      Assessment/Plan: s/p Procedure(s): IRRIGATION  AND DEBRIDEMENT PERIRECTAL ABSCESS (N/A) Plan surgery today - prone position.    The surgical procedure has been discussed with the patient.  Potential risks, benefits, alternative treatments, and expected outcomes have been explained.  All of the patient's questions at this time have been answered.  The likelihood of reaching the patient's treatment goal is good.  The patient understand the proposed surgical procedure and wishes to proceed.   LOS: 1 day    Lareta Bruneau K. 01/05/2015

## 2015-01-05 NOTE — Anesthesia Preprocedure Evaluation (Addendum)
Anesthesia Evaluation  Patient identified by MRN, date of birth, ID band Patient awake    Reviewed: Allergy & Precautions, NPO status , Patient's Chart, lab work & pertinent test results  Airway Mallampati: II  TM Distance: >3 FB Neck ROM: Full    Dental  (+) Teeth Intact, Dental Advisory Given   Pulmonary shortness of breath and with exertion, sleep apnea and Continuous Positive Airway Pressure Ventilation , Current Smoker,  breath sounds clear to auscultation        Cardiovascular hypertension, Pt. on medications +CHF Rhythm:Regular Rate:Normal     Neuro/Psych PSYCHIATRIC DISORDERS Anxiety Depression Bipolar Disorder  Neuromuscular disease    GI/Hepatic Neg liver ROS, GERD-  ,  Endo/Other  diabetesHypothyroidism   Renal/GU negative Renal ROS     Musculoskeletal negative musculoskeletal ROS (+)   Abdominal   Peds  Hematology   Anesthesia Other Findings   Reproductive/Obstetrics negative OB ROS                           Lab Results  Component Value Date   WBC 12.6* 01/04/2015   HGB 9.9* 01/04/2015   HCT 29.9* 01/04/2015   MCV 78.3 01/04/2015   PLT 236 01/04/2015   Lab Results  Component Value Date   CREATININE 1.25* 01/05/2015   BUN 24* 01/05/2015   NA 133* 01/05/2015   K 4.1 01/05/2015   CL 100* 01/05/2015   CO2 22 01/05/2015   Lab Results  Component Value Date   INR 1.13 07/29/2012    Anesthesia Physical Anesthesia Plan  ASA: III  Anesthesia Plan: General   Post-op Pain Management:    Induction: Intravenous  Airway Management Planned: Oral ETT  Additional Equipment:   Intra-op Plan:   Post-operative Plan:   Informed Consent: I have reviewed the patients History and Physical, chart, labs and discussed the procedure including the risks, benefits and alternatives for the proposed anesthesia with the patient or authorized representative who has indicated his/her  understanding and acceptance.   Dental advisory given  Plan Discussed with:   Anesthesia Plan Comments:         Anesthesia Quick Evaluation

## 2015-01-05 NOTE — Anesthesia Postprocedure Evaluation (Signed)
  Anesthesia Post-op Note  Patient: [name] [name]  Procedure(s) Performed: Procedure(s): IRRIGATION AND DEBRIDEMENT PERIRECTAL ABSCESS (N/A)  Patient Location: PACU  Anesthesia Type:General  Level of Consciousness: awake, alert  and oriented  Airway and Oxygen Therapy: Patient Spontanous Breathing  Post-op Pain: mild  Post-op Assessment: Post-op Vital signs reviewed, Patient's Cardiovascular Status Stable, Respiratory Function Stable, Patent Airway, No signs of Nausea or vomiting and Pain level controlled              Post-op Vital Signs: Reviewed and stable  Last Vitals:  Filed Vitals:   01/05/15 1430  BP: 123/79  Pulse: 110  Temp: 37.3 C  Resp: 18    Complications: No apparent anesthesia complications

## 2015-01-05 NOTE — Progress Notes (Signed)
Inpatient Diabetes Program Recommendations  AACE/ADA: New Consensus Statement on Inpatient Glycemic Control (2013)  Target Ranges:  Prepandial:   less than 140 mg/dL      Peak postprandial:   less than 180 mg/dL (1-2 hours)      Critically ill patients:  140 - 180 mg/dL   Reason for Admission: Right Buttock abscess/Hyperglycemia   Diabetes history: DM 2 Outpatient Diabetes medications: Lantus 23 units, Novolog 7 units TID, Metformin 500 mg Daily Current orders for Inpatient glycemic control: Lantus 20 units Q24hrs  Inpatient Diabetes Program Recommendations Correction (SSI): Please consider starting Novolog Sensitive Correction and HS scale in addition to starting the basal insulin.  Note transitioning from IV insulin this am. Had I&D this am.  Thanks,  Tama Headings RN, MSN, [facility] Inpatient Diabetes Coordinator Team Pager [phone]

## 2015-01-05 NOTE — Transfer of Care (Signed)
Immediate Anesthesia Transfer of Care Note  Patient: [name] [name]  Procedure(s) Performed: Procedure(s): IRRIGATION AND DEBRIDEMENT PERIRECTAL ABSCESS (N/A)  Patient Location: PACU  Anesthesia Type:General  Level of Consciousness: awake, alert  and oriented  Airway & Oxygen Therapy: Patient Spontanous Breathing and Patient connected to nasal cannula oxygen  Post-op Assessment: Report given to RN, Post -op Vital signs reviewed and stable and Patient moving all extremities X 4  Post vital signs: Reviewed and stable  Last Vitals:  Filed Vitals:   01/05/15 1100  BP:   Pulse: 111  Temp:   Resp: 34    Complications: No apparent anesthesia complications

## 2015-01-05 NOTE — Progress Notes (Signed)
Report called to the OR for the patient. Patient will be transported via bed.

## 2015-01-05 NOTE — Progress Notes (Addendum)
Pl cosign- thanks !  Subjective: Pt was in considerable distress when examined and was sitting over commode dry heaving.  She was in a lot of pain  She was on the way to surgery for i&d   Objective: Vital signs in last 24 hours: Filed Vitals:   01/05/15 0825 01/05/15 0900 01/05/15 1000 01/05/15 1100  BP: 107/76 110/80 115/82   Pulse: 97 95 104 111  Temp: 98.9 F (37.2 C)     TempSrc: Oral     Resp: 22 22 25  34  Height:      Weight:      SpO2: 98% 100% 97% 97%   Weight change:   Intake/Output Summary (Last 24 hours) at 01/05/15 1141 Last data filed at 01/05/15 0742  Gross per 24 hour  Intake 1090.93 ml  Output   2250 ml  Net -1159.07 ml   General: Obese AA female, alert, cooperative, NAD. Diaphoretic.  HEENT: PERRL, EOMI. Moist mucus membranes Neck: Full range of motion without pain, supple, no lymphadenopathy or carotid bruits Lungs: Clear to ascultation bilaterally, normal work of respiration, no wheezes, rales, rhonchi Heart: tachcyardic, 2/6 systolic murmur. No gallops, or rubs Abdomen: Soft, non-tender, non-distended, BS + Extremities: No cyanosis, clubbing, or edema. Large area of erythema and significant induration on right buttock (~20 cm x 12 cm) extending anterior to include perineum. Does not involve vulva/labia or anus. Large central area of skin sloughing and drainage (~4 cm x 5 cm). No obvious fluctuance noted. Quite tender to palpation. Foul smelling.   Lab Results: @LABTEST2 @ Micro Results: Recent Results (from the past 240 hour(s))  MRSA PCR Screening     Status: None   Collection Time: 01/04/15 10:33 PM  Result Value Ref Range Status   MRSA by PCR NEGATIVE NEGATIVE Final    Comment:        The GeneXpert MRSA Assay (FDA approved for NASAL specimens only), is one component of a comprehensive MRSA colonization surveillance program. It is not intended to diagnose MRSA infection nor to guide or monitor treatment for MRSA infections.   Culture,  blood (routine x 2)     Status: None (Preliminary result)   Collection Time: 01/05/15  5:27 AM  Result Value Ref Range Status   Specimen Description BLOOD RIGHT HAND  Final   Special Requests IN PEDIATRIC BOTTLE 1CC  Final   Culture PENDING  Incomplete   Report Status PENDING  Incomplete  Culture, blood (routine x 2)     Status: None (Preliminary result)   Collection Time: 01/05/15  5:31 AM  Result Value Ref Range Status   Specimen Description BLOOD RIGHT ANTECUBITAL  Final   Special Requests IN PEDIATRIC BOTTLE  3CC  Final   Culture PENDING  Incomplete   Report Status PENDING  Incomplete   Studies/Results: No results found. Medications: I have reviewed the patient's current medications. Scheduled Meds: . clindamycin (CLEOCIN) IV  600 mg Intravenous Q8H  . FLUoxetine  20 mg Oral Daily  . heparin  5,000 Units Subcutaneous 3 times per day  . insulin glargine  20 Units Subcutaneous Daily  . levothyroxine  25 mcg Oral QAC breakfast  . piperacillin-tazobactam (ZOSYN)  IV  3.375 g Intravenous Q8H  . rosuvastatin  10 mg Oral QHS  . sodium chloride  3 mL Intravenous Q12H   Continuous Infusions: . sodium chloride Stopped (01/05/15 0316)  . dextrose 5 % and 0.45% NaCl Stopped (01/05/15 1134)  . insulin (NOVOLIN-R) infusion Stopped (01/05/15 1046)  . lactated  ringers 10 mL/hr at 01/05/15 1133   PRN Meds:.diphenhydrAMINE, morphine injection, ondansetron **OR** ondansetron (ZOFRAN) IV Assessment/Plan: Active Problems:   Hyperglycemia   Abscess of right buttock Abcess of right buttock -likely related to her poorly controlled diabetes. Surgery has seen the patient with plan to I&D her abcess today -noted to have a low grade fever (101.4), tachycardia, and mild leukocytosis on admission, fever had resolved with most recent being 98.9 -clindamycin 600mg  IV q8h and Zosyn 3.375g IV q8h. Patient has allergy to vancomycin -morphine 2mg  IV q4h prn for pain -benadryl 25mg  PO q6h prn for  itching 2/2 morphine -zofran 4mg  q6h prn for nausea -Blood cultures pending  - in OR RIGHT NOW for I&D  Hyperglycemia/mild DKA -glucose of 610 on admission with mild acidosis and anion gap of 15, which has closed to AG 11 on AM lab, and recent CBGs 150-200s -patient with a history of poor compliance to her medications. Last Hgb A1c of 12.4 in December 2015. -Home meds include: Novolog 7 units tid with meals, Lantus 23 units qhs, metformin 500mg  daily. She is non-compliant with her meds -NS IV fluids @ 192mL/hr after NS IV fluid 1075mL bolus. Transition to D5-1/2NS when CBG <250 -KCl 14mEq x 2 -insulin drip -BMP q4h -hourly CBG -Hgb A1c, magnesium in the AM -consult to care management placed. Patient appears to have lack of insight into her medical problems and claims inability to afford insulin and other medications  AKI -Creatine of 1.85 on admission -IV fluids as above -BMP q4h as above  Hypertension -on HCTZ 25mg  daily and Benicar 20mg  daily. -held for now given her soft BP  Hyperlipidemia -continue Crestor 10mg  daily  Hypothyroidism -TSH pending with last being 6.3 in February 2016 -continue Synthroid 92mcg daily  Iron deficiency anemia -anemia panel from February 2016: iron 36, TIBC 243, ferritin 96, folate 16, vitamin b12 411 -contributing factors could be hypothyroidism, anemia chronic disease, history of heavy menses. Patient was referred to [facility] for evaluation of heavy menses in February 2016 but does not appear to have followed up with that  Depression -continue Prozac 20mg  daily  Diet: NPO  DVT: heparin  Dispo: Disposition is deferred at this time, awaiting improvement of current medical problems.  Anticipated discharge in approximately 2-3 day(s).   The patient does have a current PCP Milagros Loll, MD) and does need an [facility] hospital follow-up appointment after discharge.  The patient does not have transportation limitations that hinder  transportation to clinic appointments.  .Services Needed at time of discharge: Y = Yes, Blank = No PT:   OT:   RN:   Equipment:   Other:     LOS: 1 day   Burgess Estelle, MD 01/05/2015, 11:41 AM

## 2015-01-05 NOTE — Care Management Note (Signed)
Case Management Note  Patient Details  Name: [name] [name] MRN: [id number] Date of Birth: [birth year]  Subjective/Objective:     Adm w buttock abscess               Action/Plan: lives w fam, pcp dr Jacques Earthly   Expected Discharge Date:                 Expected Discharge Plan:  [place]  In-House Referral:     Discharge planning Services     Post Acute Care Choice:    Choice offered to:     DME Arranged:    DME Agency:     HH Arranged:    [place]:     Status of Service:     Medicare Important Message Given:    Date Medicare IM Given:    Medicare IM give by:    Date Additional Medicare IM Given:    Additional Medicare Important Message give by:     If discussed at [place] of Stay Meetings, dates discussed:    Additional Comments: ur review done, may need hhc for wound care , will follow and assist.  Lacretia Leigh, RN 01/05/2015, 10:26 AM

## 2015-01-05 NOTE — Op Note (Signed)
Preop diagnosis: Right buttock abscess Postop diagnosis: Same Procedure performed: Incision and debridement of skin and subcutaneous tissue of right buttock abscess Surgeon:  Donnie Mesa K. Anesthesia: general  Indications: This is a 39 year old female with poorly controlled insulin-dependent diabetes who has not been taking her insulin for the last 5 days and has a extensive past history of subcutaneous abscesses who presents with a very large area of induration tenderness and purulent drainage in her right buttock. Her blood sugars were wildly abnormal in the 600s. She was admitted and the hospitalists have been able to control her blood sugars. She presents now for debridement.  The patient is brought to the operating room and placed in the supine position on the stretcher. After an adequate level of general anesthesia was obtained the patient was flipped to a prone position on the operating table. Her buttocks and perineum were prepped with Betadine and draped sterile fashion. There is some purulent fluid draining from a pinhole opening in the midline. I made a stab incision into the most fluctuant area. We encountered a large amount of foul-smelling purulent fluid. This was cultured and sent for microbiology. I excised some of the skin was obtained tissues with cautery. We entered a large abscess cavity measuring about 6 x 5 cm across. We open this widely and abraded a lot of the necrotic subcutaneous tissue. This tract anteriorly along the right side of her buttocks towards the rectum. There is minimal tracking across the left side to the left side of the rectum. We debrided this widely down to healthy-appearing tissue. We then used 3 L of saline and the pulse lavage device to thoroughly irrigate the entire wound. No other necrotic tissue was noted. The wound was packed with an entire roll of saline moistened Kerlix. A dry dressing was applied. The patient was then extubated and brought to the  recovery room in stable condition. All sponge, initially, and needle counts are correct.   [name] [name]. Georgette Dover, MD, [facility] Surgery  General/ Trauma Surgery  01/05/2015 1:46 PM

## 2015-01-05 NOTE — Clinical Documentation Improvement (Signed)
Possible Clinical Conditions?  Septicemia / Sepsis Severe Sepsis Septic Shock Other Condition  Cannot clinically Determine  Supporting Information:(As per notes) "Abcess of right buttock -likely related to her poorly controlled diabetes."  Diagnostics: Vitals upon admission :BP 128/60 mmHg  Pulse 120  Temp(Src) 101.4 F (38.6 C) (Oral)  Resp 22  Ht 5\' 10"  (1.778 m)  SpO2 97%   Labs on Admission - WBC=12.6 Treatment:-clindamycin 600mg  IV q8h and Zosyn 3.375g IV q8h. Patient has allergy to vancomycin   Thank You, Alessandra Grout, RN, BSN, CCDS,Clinical Documentation Specialist:  [phone]  [phone]=[employer] [place]- Health Information Management

## 2015-01-05 NOTE — Anesthesia Procedure Notes (Signed)
Procedure Name: Intubation Date/Time: 01/05/2015 12:49 PM Performed by: Garrison [place] T Pre-anesthesia Checklist: Patient identified, Emergency Drugs available, Suction available and Patient being monitored Patient Re-evaluated:Patient Re-evaluated prior to inductionOxygen Delivery Method: Circle system utilized Preoxygenation: Pre-oxygenation with 100% oxygen Intubation Type: IV induction Ventilation: Mask ventilation without difficulty Laryngoscope Size: Miller and 2 Grade View: Grade I Tube type: Oral Tube size: 7.5 mm Number of attempts: 1 Airway Equipment and Method: Stylet Placement Confirmation: ETT inserted through vocal cords under direct vision,  positive ETCO2 and breath sounds checked- equal and bilateral Secured at: 22 cm Tube secured with: Tape Dental Injury: Teeth and Oropharynx as per pre-operative assessment

## 2015-01-06 ENCOUNTER — Encounter (HOSPITAL_COMMUNITY): Payer: Self-pay | Admitting: Surgery

## 2015-01-06 DIAGNOSIS — E11331 Type 2 diabetes mellitus with moderate nonproliferative diabetic retinopathy with macular edema: Secondary | ICD-10-CM

## 2015-01-06 DIAGNOSIS — E1342 Other specified diabetes mellitus with diabetic polyneuropathy: Secondary | ICD-10-CM

## 2015-01-06 DIAGNOSIS — I10 Essential (primary) hypertension: Secondary | ICD-10-CM

## 2015-01-06 DIAGNOSIS — G629 Polyneuropathy, unspecified: Secondary | ICD-10-CM

## 2015-01-06 DIAGNOSIS — L0231 Cutaneous abscess of buttock: Secondary | ICD-10-CM

## 2015-01-06 DIAGNOSIS — Z9119 Patient's noncompliance with other medical treatment and regimen: Secondary | ICD-10-CM

## 2015-01-06 DIAGNOSIS — R739 Hyperglycemia, unspecified: Secondary | ICD-10-CM

## 2015-01-06 LAB — GLUCOSE, CAPILLARY
GLUCOSE-CAPILLARY: 416 mg/dL — AB (ref 65–99)
GLUCOSE-CAPILLARY: 412 mg/dL — AB (ref 65–99)
GLUCOSE-CAPILLARY: 385 mg/dL — AB (ref 65–99)
GLUCOSE-CAPILLARY: 414 mg/dL — AB (ref 65–99)
Glucose-Capillary: 424 mg/dL — ABNORMAL HIGH (ref 65–99)
Glucose-Capillary: 310 mg/dL — ABNORMAL HIGH (ref 65–99)
Glucose-Capillary: 307 mg/dL — ABNORMAL HIGH (ref 65–99)
Glucose-Capillary: 256 mg/dL — ABNORMAL HIGH (ref 65–99)

## 2015-01-06 LAB — BASIC METABOLIC PANEL
Anion gap: 15 (ref 5–15)
Anion gap: 11 (ref 5–15)
BUN: 32 mg/dL — AB (ref 6–20)
BUN: 36 mg/dL — AB (ref 6–20)
CHLORIDE: 93 mmol/L — AB (ref 101–111)
CHLORIDE: 95 mmol/L — AB (ref 101–111)
CO2: 17 mmol/L — ABNORMAL LOW (ref 22–32)
CO2: 22 mmol/L (ref 22–32)
CREATININE: 1.34 mg/dL — AB (ref 0.44–1.00)
Calcium: 8.2 mg/dL — ABNORMAL LOW (ref 8.9–10.3)
Calcium: 8.2 mg/dL — ABNORMAL LOW (ref 8.9–10.3)
Creatinine, Ser: 1.52 mg/dL — ABNORMAL HIGH (ref 0.44–1.00)
GFR calc Af Amer: 49 mL/min — ABNORMAL LOW (ref >60)
GFR calc non Af Amer: 43 mL/min — ABNORMAL LOW (ref >60)
GFR, EST AFRICAN AMERICAN: 57 mL/min — AB (ref >60)
GFR, EST NON AFRICAN AMERICAN: 50 mL/min — AB (ref >60)
GLUCOSE: 450 mg/dL — AB (ref 65–99)
Glucose, Bld: 478 mg/dL — ABNORMAL HIGH (ref 65–99)
POTASSIUM: 5.3 mmol/L — AB (ref 3.5–5.1)
Potassium: 4.5 mmol/L (ref 3.5–5.1)
SODIUM: 128 mmol/L — AB (ref 135–145)
Sodium: 125 mmol/L — ABNORMAL LOW (ref 135–145)

## 2015-01-06 LAB — HEMOGLOBIN A1C
Hgb A1c MFr Bld: 15.3 % — ABNORMAL HIGH (ref 4.8–5.6)
MEAN PLASMA GLUCOSE: 392 mg/dL

## 2015-01-06 LAB — CBC
HEMATOCRIT: 28.7 % — AB (ref 36.0–46.0)
Hemoglobin: 9.4 g/dL — ABNORMAL LOW (ref 12.0–15.0)
MCH: 26 pg (ref 26.0–34.0)
MCHC: 32.8 g/dL (ref 30.0–36.0)
MCV: 79.5 fL (ref 78.0–100.0)
PLATELETS: 223 10*3/uL (ref 150–400)
RBC: 3.61 MIL/uL — ABNORMAL LOW (ref 3.87–5.11)
RDW: 14.3 % (ref 11.5–15.5)
WBC: 13.6 10*3/uL — AB (ref 4.0–10.5)

## 2015-01-06 LAB — CORTISOL: CORTISOL PLASMA: 5.4 ug/dL

## 2015-01-06 MED ORDER — DOXYCYCLINE HYCLATE 100 MG PO TABS
100.0000 mg | ORAL_TABLET | Freq: Two times a day (BID) | ORAL | Status: DC
  Administered 2015-01-06 – 2015-01-08 (×5): 100 mg via ORAL
  Filled 2015-01-06 (×6): qty 1

## 2015-01-06 MED ORDER — INSULIN REGULAR BOLUS VIA INFUSION
0.0000 [IU] | Freq: Three times a day (TID) | INTRAVENOUS | Status: DC
  Filled 2015-01-06: qty 10

## 2015-01-06 MED ORDER — INSULIN GLARGINE 100 UNIT/ML ~~LOC~~ SOLN: 35.0000 [IU] | Freq: Every day | SUBCUTANEOUS | Status: DC

## 2015-01-06 MED ORDER — INSULIN ASPART 100 UNIT/ML ~~LOC~~ SOLN: 23.0000 [IU] | Freq: Once | SUBCUTANEOUS | Status: DC

## 2015-01-06 MED ORDER — INSULIN ASPART 100 UNIT/ML ~~LOC~~ SOLN
0.0000 [IU] | Freq: Three times a day (TID) | SUBCUTANEOUS | Status: DC
  Administered 2015-01-06 (×2): 15 [IU] via SUBCUTANEOUS

## 2015-01-06 MED ORDER — INSULIN ASPART 100 UNIT/ML ~~LOC~~ SOLN: 8.0000 [IU] | Freq: Three times a day (TID) | SUBCUTANEOUS | Status: DC

## 2015-01-06 MED ORDER — SODIUM CHLORIDE 0.9 % IV SOLN
INTRAVENOUS | Status: DC
  Administered 2015-01-06: 17:00:00 via INTRAVENOUS
  Filled 2015-01-06: qty 2.5

## 2015-01-06 MED ORDER — INSULIN ASPART 100 UNIT/ML ~~LOC~~ SOLN: 0.0000 [IU] | SUBCUTANEOUS | Status: DC

## 2015-01-06 MED ORDER — SODIUM CHLORIDE 0.9 % IV BOLUS (SEPSIS)
1000.0000 mL | Freq: Once | INTRAVENOUS | Status: AC
  Administered 2015-01-06: 1000 mL via INTRAVENOUS

## 2015-01-06 MED ORDER — HYDROMORPHONE HCL 1 MG/ML IJ SOLN
1.0000 mg | INTRAMUSCULAR | Status: DC | PRN
  Administered 2015-01-06 – 2015-01-07 (×7): 1 mg via INTRAVENOUS
  Filled 2015-01-06 (×9): qty 1

## 2015-01-06 MED ORDER — DEXTROSE 50 % IV SOLN: 25.0000 mL | INTRAVENOUS | Status: DC | PRN

## 2015-01-06 MED ORDER — PIPERACILLIN-TAZOBACTAM 3.375 G IVPB
3.3750 g | Freq: Three times a day (TID) | INTRAVENOUS | Status: DC
  Administered 2015-01-06 – 2015-01-08 (×5): 3.375 g via INTRAVENOUS
  Filled 2015-01-06 (×10): qty 50

## 2015-01-06 MED ORDER — DEXTROSE-NACL 5-0.45 % IV SOLN
INTRAVENOUS | Status: DC
  Administered 2015-01-07: 01:00:00 via INTRAVENOUS

## 2015-01-06 MED ORDER — DOCUSATE SODIUM 100 MG PO CAPS
100.0000 mg | ORAL_CAPSULE | Freq: Two times a day (BID) | ORAL | Status: DC
  Administered 2015-01-06 – 2015-01-08 (×5): 100 mg via ORAL
  Filled 2015-01-06 (×7): qty 1

## 2015-01-06 MED ORDER — INSULIN GLARGINE 100 UNIT/ML ~~LOC~~ SOLN
25.0000 [IU] | Freq: Every day | SUBCUTANEOUS | Status: DC
  Administered 2015-01-06: 25 [IU] via SUBCUTANEOUS
  Filled 2015-01-06 (×2): qty 0.25

## 2015-01-06 MED ORDER — INSULIN ASPART 100 UNIT/ML ~~LOC~~ SOLN: 0.0000 [IU] | Freq: Every day | SUBCUTANEOUS | Status: DC

## 2015-01-06 MED ORDER — SODIUM CHLORIDE 0.9 % IV SOLN
INTRAVENOUS | Status: DC
  Administered 2015-01-06: 100 mL/h via INTRAVENOUS

## 2015-01-06 NOTE — Progress Notes (Signed)
Patient ID: [name] [name], female   DOB: 11/28/1975, 39 y.o.   MRN: [id number] 1 Day Post-Op  Subjective: Feels ok today.  Having some pain.  Already had her first dressing change.  RN states wound looks clean   Objective: Vital signs in last 24 hours: Temp:  [97.3 F (36.3 C)-99.5 F (37.5 C)] 97.3 F (36.3 C) (08/16 0753) Pulse Rate:  [79-118] 84 (08/16 0753) Resp:  [16-34] 22 (08/16 0753) BP: (107-152)/(58-84) 117/65 mmHg (08/16 0753) SpO2:  [27 %-100 %] 100 % (08/16 0753) Last BM Date: 01/04/15  Intake/Output from previous day: 08/15 0701 - 08/16 0700 In: [id number] [P.O.:720; I.V.:1067] Out: 1020 [Urine:1000; Blood:20] Intake/Output this shift:    PE: Skin: packing in place but surrounding induration still present, but no definitive erythema noted.  No purulent drainage noted  Lab Results:   Recent Labs  01/05/15 1024 01/06/15 0550  WBC 13.0* 13.6*  HGB 10.1* 9.4*  HCT 30.1* 28.7*  PLT 222 223   BMET  Recent Labs  01/05/15 0527 01/06/15 0550  NA 133* 125*  K 4.1 5.3*  CL 100* 93*  CO2 22 17*  GLUCOSE 182* 450*  BUN 24* 32*  CREATININE 1.25* 1.52*  CALCIUM 8.5* 8.2*   PT/INR No results for input(s): LABPROT, INR in the last 72 hours. CMP     Component Value Date/Time   NA 125* 01/06/2015 0550   K 5.3* 01/06/2015 0550   CL 93* 01/06/2015 0550   CO2 17* 01/06/2015 0550   GLUCOSE 450* 01/06/2015 0550   BUN 32* 01/06/2015 0550   CREATININE 1.52* 01/06/2015 0550   CREATININE 0.86 02/03/2014 1017   CALCIUM 8.2* 01/06/2015 0550   PROT 6.7 01/04/2015 1753   ALBUMIN 2.2* 01/04/2015 1753   AST 29 01/04/2015 1753   ALT 23 01/04/2015 1753   ALKPHOS 181* 01/04/2015 1753   BILITOT 1.3* 01/04/2015 1753   GFRNONAA 43* 01/06/2015 0550   GFRNONAA 83 07/23/2013 1604   GFRAA 49* 01/06/2015 0550   GFRAA >89 07/23/2013 1604   Lipase  No results found for: LIPASE     Studies/Results: No results found.  Anti-infectives: Anti-infectives    Start      Dose/Rate Route Frequency Ordered Stop   01/06/15 1000  doxycycline (VIBRA-TABS) tablet 100 mg     100 mg Oral Every 12 hours 01/06/15 0726     01/05/15 1000  piperacillin-tazobactam (ZOSYN) IVPB 3.375 g     3.375 g 12.5 mL/hr over 240 Minutes Intravenous Every 8 hours 01/05/15 0358     01/05/15 1000  clindamycin (CLEOCIN) IVPB 600 mg  Status:  Discontinued     600 mg 100 mL/hr over 30 Minutes Intravenous Every 8 hours 01/05/15 0401 01/05/15 1357   01/04/15 2200  piperacillin-tazobactam (ZOSYN) IVPB 3.375 g  Status:  Discontinued     3.375 g 12.5 mL/hr over 240 Minutes Intravenous Every 8 hours 01/04/15 2053 01/05/15 0358   01/04/15 2200  clindamycin (CLEOCIN) IVPB 600 mg  Status:  Discontinued     600 mg 100 mL/hr over 30 Minutes Intravenous Every 8 hours 01/04/15 2100 01/05/15 0401       Assessment/Plan  POD 1, s/p I&D of buttock abscess -cont abx therapy -cont dressing changes BID -await cultures   LOS: 2 days    Ferry Matthis E 01/06/2015, 8:17 AM Pager: [phone]

## 2015-01-06 NOTE — Progress Notes (Signed)
Subjective:  No acute events overnight Patient is POD1 s/p I&D of abscess of right perianal area, She rates the pain as 7/10, and 3/10 if using the dilaudid q3h- we offered to increase the frequency to [facility] Her blood sugars have been very poorly controlled. CBG 400 AM with increasing AG of 15    Objective: Vital signs in last 24 hours: Filed Vitals:   01/06/15 0435 01/06/15 0516 01/06/15 0753 01/06/15 1247  BP:   117/65 100/75  Pulse:  79 84 92  Temp: 98.5 F (36.9 C)  97.3 F (36.3 C) 97.7 F (36.5 C)  TempSrc: Oral  Oral Oral  Resp:  22 22 14   Height:      Weight:      SpO2:  100% 100% 98%   Weight change:   Intake/Output Summary (Last 24 hours) at 01/06/15 1307 Last data filed at 01/06/15 1247  Gross per 24 hour  Intake 2021.75 ml  Output   1220 ml  Net 801.75 ml   General: Obese AA female, alert, cooperative, NAD. Diaphoretic.  HEENT: PERRL, EOMI. Moist mucus membranes Neck: Full range of motion without pain, supple, no lymphadenopathy or carotid bruits Lungs: Clear to ascultation bilaterally, normal work of respiration, no wheezes, rales, rhonchi Heart: regular rate and rhythm, 2/6 systolic murmur. No gallops, or rubs Abdomen: Soft, non-tender, non-distended, BS + Extremities: No cyanosis, clubbing, or edema. POD1 s/p I&D of right perianal abscess- currently packed with kerlex, dressings being changed every day, no drainage noted, some redness and tenderness to palpation.   Lab Results: @LABTEST2 @ CBC Latest Ref Rng 01/06/2015 01/05/2015 01/04/2015  WBC 4.0 - 10.5 K/uL 13.6(H) 13.0(H) 12.6(H)  Hemoglobin 12.0 - 15.0 g/dL 9.4(L) 10.1(L) 9.9(L)  Hematocrit 36.0 - 46.0 % 28.7(L) 30.1(L) 29.9(L)  Platelets 150 - 400 K/uL 223 222 236    BMP Latest Ref Rng 01/06/2015 01/05/2015 01/05/2015  Glucose 65 - 99 mg/dL 450(H) 182(H) 294(H)  BUN 6 - 20 mg/dL 32(H) 24(H) 25(H)  Creatinine 0.44 - 1.00 mg/dL 1.52(H) 1.25(H) 1.27(H)  Sodium 135 - 145 mmol/L 125(L) 133(L) 133(L)    Potassium 3.5 - 5.1 mmol/L 5.3(H) 4.1 4.0  Chloride 101 - 111 mmol/L 93(L) 100(L) 100(L)  CO2 22 - 32 mmol/L 17(L) 22 21(L)  Calcium 8.9 - 10.3 mg/dL 8.2(L) 8.5(L) 8.6(L)     Micro Results: Recent Results (from the past 240 hour(s))  MRSA PCR Screening     Status: None   Collection Time: 01/04/15 10:33 PM  Result Value Ref Range Status   MRSA by PCR NEGATIVE NEGATIVE Final    Comment:        The GeneXpert MRSA Assay (FDA approved for NASAL specimens only), is one component of a comprehensive MRSA colonization surveillance program. It is not intended to diagnose MRSA infection nor to guide or monitor treatment for MRSA infections.   Culture, blood (routine x 2)     Status: None (Preliminary result)   Collection Time: 01/05/15  5:27 AM  Result Value Ref Range Status   Specimen Description BLOOD RIGHT HAND  Final   Special Requests IN PEDIATRIC BOTTLE 1CC  Final   Culture PENDING  Incomplete   Report Status PENDING  Incomplete  Culture, blood (routine x 2)     Status: None (Preliminary result)   Collection Time: 01/05/15  5:31 AM  Result Value Ref Range Status   Specimen Description BLOOD RIGHT ANTECUBITAL  Final   Special Requests IN PEDIATRIC BOTTLE 3CC  Final   Culture  NO GROWTH 1 DAY  Final   Report Status PENDING  Incomplete  Anaerobic culture     Status: None (Preliminary result)   Collection Time: 01/05/15  1:07 PM  Result Value Ref Range Status   Specimen Description ABSCESS BUTTOCKS  Final   Special Requests PT ON ZOSYN  Final   Gram Stain   Final    ABUNDANT WBC PRESENT,BOTH PMN AND MONONUCLEAR NO SQUAMOUS EPITHELIAL CELLS SEEN ABUNDANT GRAM NEGATIVE RODS FEW GRAM POSITIVE COCCI IN PAIRS    Culture   Final    NO ANAEROBES ISOLATED; CULTURE IN PROGRESS FOR 5 DAYS Performed at [facility]    Report Status PENDING  Incomplete  Culture, routine-abscess     Status: None (Preliminary result)   Collection Time: 01/05/15  1:07 PM  Result Value Ref  Range Status   Specimen Description ABSCESS BUTTOCKS  Final   Special Requests PT ON ZOSYN  Final   Gram Stain   Final    ABUNDANT WBC PRESENT,BOTH PMN AND MONONUCLEAR NO SQUAMOUS EPITHELIAL CELLS SEEN ABUNDANT GRAM NEGATIVE RODS FEW GRAM POSITIVE COCCI IN PAIRS    Culture NO GROWTH Performed at [facility]   Final   Report Status PENDING  Incomplete   Studies/Results: No results found. Medications: I have reviewed the patient's current medications. Scheduled Meds: . docusate sodium  100 mg Oral BID  . doxycycline  100 mg Oral Q12H  . FLUoxetine  20 mg Oral Daily  . heparin  5,000 Units Subcutaneous 3 times per day  . insulin aspart  0-15 Units Subcutaneous TID WC  . insulin aspart  0-5 Units Subcutaneous QHS  . insulin aspart  4 Units Subcutaneous TID WC  . insulin glargine  25 Units Subcutaneous Daily  . levothyroxine  25 mcg Oral QAC breakfast  . piperacillin-tazobactam (ZOSYN)  IV  3.375 g Intravenous 3 times per day  . rosuvastatin  10 mg Oral QHS  . sodium chloride  3 mL Intravenous Q12H   Continuous Infusions: . sodium chloride 75 mL/hr at 01/05/15 1751  . lactated ringers 10 mL/hr at 01/05/15 1133   PRN Meds:.diphenhydrAMINE, HYDROmorphone (DILAUDID) injection, ondansetron **OR** ondansetron (ZOFRAN) IV Assessment/Plan: Active Problems:   Hyperglycemia   Abscess of right buttock  Sepsis secondary to Abcess of right buttock-likely related to her poorly controlled diabetes. -noted to have a low grade fever (101.4), tachycardia, and mild leukocytosis on admission, fever had resolved with most recent being 98.9  -POD1 s/p I&d ,6x5 cm wound cavity was packed with kerlex, some redness still present  -Wound cultures grew gram negative rods , and few gram positive cocci in pairs   -blood cultures pending -Reinstated Zosyn 3.375g IV q8h- per pharmacy. Patient has allergy to vancomycin, we d/c the clindamycin yesterday -Doxycycline 100 mg BID -Dilaudid 1 mg  q@HRS  PRN -zofran 4mg  q6h prn for nausea   Hyperglycemia/mild DKA- glucose of 610 on admission with mild acidosis and anion gap of 15 This AM- AG of 15, low bicarb, and CBG of 450, most recent A1C around 15 -Patient may be going again in DKA -Given 1 L fluid bolus times 2 -Increased Lantus to 35 units daily, Novolog 8 units with meals for tomorrow -Given 23 units of reg insulin once at 4:30 PM -on SSI q4 -consult to care management placed. Patient appears to have lack of insight into her medical problems and claims inability to afford insulin and other medications  Hyponatremia and Hyperkalemia (abnormal electrolytes on the BMET): Her  sodium was 125 but the corrected Na was 131 with K of 5.3  Most likely due to surgery and the recent stressors  -We checked cortisol to rule out adrenal insufficiency as the cause.   AKI- improving  -Creatine of 1.85 on admission --> 1.52 -IV fluids as above  Hypertension -on HCTZ 25mg  daily and Benicar 20mg  daily. -held for now given her soft BP  Hyperlipidemia -continue Crestor 10mg  daily  Hypothyroidism -TSH 4 -continue Synthroid 86mcg daily  Iron deficiency anemia -anemia panel from February 2016: iron 36, TIBC 243, ferritin 96, folate 16, vitamin b12 411 -contributing factors could be hypothyroidism, anemia chronic disease, history of heavy menses. Patient was referred to [facility] for evaluation of heavy menses in February 2016 but does not appear to have followed up with that  Depression -continue Prozac 20mg  daily  Diet: NPO  DVT: heparin  Dispo: Disposition is deferred at this time, awaiting improvement of current medical problems.  Anticipated discharge in approximately 2 day(s).   The patient does have a current PCP Milagros Loll, MD) and does need an [facility] hospital follow-up appointment after discharge.  The patient does not have transportation limitations that hinder transportation to clinic appointments.  .Services  Needed at time of discharge: Y = Yes, Blank = No PT:   OT:   RN:   Equipment:   Other:     LOS: 2 days   Burgess Estelle, MD 01/06/2015, 1:07 PM

## 2015-01-06 NOTE — Progress Notes (Signed)
01/06/2015 Patient Blood sugar at 0750 was 424, Dr Heber [place] from internal medicine teaching service was made aware. [facility] RN.

## 2015-01-07 DIAGNOSIS — E2740 Unspecified adrenocortical insufficiency: Secondary | ICD-10-CM

## 2015-01-07 DIAGNOSIS — R7989 Other specified abnormal findings of blood chemistry: Secondary | ICD-10-CM | POA: Clinically undetermined

## 2015-01-07 LAB — GLUCOSE, CAPILLARY
GLUCOSE-CAPILLARY: 168 mg/dL — AB (ref 65–99)
GLUCOSE-CAPILLARY: 140 mg/dL — AB (ref 65–99)
GLUCOSE-CAPILLARY: 344 mg/dL — AB (ref 65–99)
Glucose-Capillary: 133 mg/dL — ABNORMAL HIGH (ref 65–99)
Glucose-Capillary: 141 mg/dL — ABNORMAL HIGH (ref 65–99)
Glucose-Capillary: 146 mg/dL — ABNORMAL HIGH (ref 65–99)
Glucose-Capillary: 153 mg/dL — ABNORMAL HIGH (ref 65–99)
Glucose-Capillary: 156 mg/dL — ABNORMAL HIGH (ref 65–99)
Glucose-Capillary: 166 mg/dL — ABNORMAL HIGH (ref 65–99)
Glucose-Capillary: 181 mg/dL — ABNORMAL HIGH (ref 65–99)
Glucose-Capillary: 187 mg/dL — ABNORMAL HIGH (ref 65–99)
Glucose-Capillary: 436 mg/dL — ABNORMAL HIGH (ref 65–99)

## 2015-01-07 LAB — BASIC METABOLIC PANEL
ANION GAP: 10 (ref 5–15)
BUN: 38 mg/dL — ABNORMAL HIGH (ref 6–20)
CALCIUM: 8.6 mg/dL — AB (ref 8.9–10.3)
CO2: 22 mmol/L (ref 22–32)
Chloride: 101 mmol/L (ref 101–111)
Creatinine, Ser: 1.19 mg/dL — ABNORMAL HIGH (ref 0.44–1.00)
GFR, EST NON AFRICAN AMERICAN: 57 mL/min — AB (ref >60)
Glucose, Bld: 149 mg/dL — ABNORMAL HIGH (ref 65–99)
Potassium: 4.1 mmol/L (ref 3.5–5.1)
Sodium: 133 mmol/L — ABNORMAL LOW (ref 135–145)

## 2015-01-07 LAB — CORTISOL: Cortisol, Plasma: 1.9 ug/dL

## 2015-01-07 MED ORDER — INSULIN ASPART 100 UNIT/ML ~~LOC~~ SOLN
0.0000 [IU] | Freq: Three times a day (TID) | SUBCUTANEOUS | Status: DC
  Administered 2015-01-08 (×2): 11 [IU] via SUBCUTANEOUS

## 2015-01-07 MED ORDER — OXYCODONE-ACETAMINOPHEN 5-325 MG PO TABS
1.0000 | ORAL_TABLET | ORAL | Status: DC | PRN
  Administered 2015-01-07 – 2015-01-08 (×5): 1 via ORAL
  Filled 2015-01-07 (×5): qty 1

## 2015-01-07 MED ORDER — POLYETHYLENE GLYCOL 3350 17 G PO PACK: 17.0000 g | PACK | Freq: Every day | ORAL | Status: DC | PRN

## 2015-01-07 MED ORDER — INSULIN ASPART 100 UNIT/ML ~~LOC~~ SOLN
0.0000 [IU] | Freq: Every day | SUBCUTANEOUS | Status: DC
Start: 2015-01-07 — End: 2015-01-07

## 2015-01-07 MED ORDER — INSULIN ASPART 100 UNIT/ML ~~LOC~~ SOLN
0.0000 [IU] | Freq: Every day | SUBCUTANEOUS | Status: DC
Start: 2015-01-07 — End: 2015-01-08
  Administered 2015-01-07: 5 [IU] via SUBCUTANEOUS

## 2015-01-07 MED ORDER — SENNA 8.6 MG PO TABS
2.0000 | ORAL_TABLET | Freq: Every day | ORAL | Status: DC
  Administered 2015-01-07 – 2015-01-08 (×2): 17.2 mg via ORAL
  Filled 2015-01-07 (×2): qty 2

## 2015-01-07 MED ORDER — INSULIN GLARGINE 100 UNIT/ML ~~LOC~~ SOLN
40.0000 [IU] | Freq: Every day | SUBCUTANEOUS | Status: DC
  Administered 2015-01-07: 40 [IU] via SUBCUTANEOUS
  Filled 2015-01-07 (×2): qty 0.4

## 2015-01-07 MED ORDER — SODIUM CHLORIDE 0.9 % IV BOLUS (SEPSIS)
1000.0000 mL | Freq: Once | INTRAVENOUS | Status: AC
  Administered 2015-01-08: 1000 mL via INTRAVENOUS

## 2015-01-07 MED ORDER — HYDROCORTISONE 20 MG PO TABS
20.0000 mg | ORAL_TABLET | Freq: Every evening | ORAL | Status: DC
  Administered 2015-01-07: 20 mg via ORAL
  Filled 2015-01-07 (×2): qty 1

## 2015-01-07 MED ORDER — OXYCODONE-ACETAMINOPHEN 7.5-325 MG PO TABS: 1.0000 | ORAL_TABLET | ORAL | Status: DC | PRN

## 2015-01-07 MED ORDER — INSULIN ASPART 100 UNIT/ML ~~LOC~~ SOLN
0.0000 [IU] | Freq: Three times a day (TID) | SUBCUTANEOUS | Status: DC
  Administered 2015-01-07: 3 [IU] via SUBCUTANEOUS
  Administered 2015-01-07: 11 [IU] via SUBCUTANEOUS

## 2015-01-07 MED ORDER — INSULIN ASPART 100 UNIT/ML ~~LOC~~ SOLN
8.0000 [IU] | Freq: Three times a day (TID) | SUBCUTANEOUS | Status: DC
  Administered 2015-01-07 (×2): 8 [IU] via SUBCUTANEOUS

## 2015-01-07 MED ORDER — OXYCODONE HCL 5 MG PO TABS
2.5000 mg | ORAL_TABLET | ORAL | Status: DC | PRN
  Administered 2015-01-07 – 2015-01-08 (×5): 2.5 mg via ORAL
  Filled 2015-01-07 (×5): qty 1

## 2015-01-07 MED ORDER — HYDROCORTISONE 20 MG PO TABS
40.0000 mg | ORAL_TABLET | ORAL | Status: DC
  Administered 2015-01-07 – 2015-01-08 (×2): 40 mg via ORAL
  Filled 2015-01-07 (×3): qty 2

## 2015-01-07 NOTE — Progress Notes (Signed)
Subjective: NAEON, Patient feeling much better and was eating breakfast and was actively taking part in the conversation and her health  CBGs much better controlled after being on insulin drip.  Repeat cortisol 1.4- likely adrenal insufficiency  Objective: Vital signs in last 24 hours: Filed Vitals:   01/06/15 1836 01/06/15 2022 01/07/15 0252 01/07/15 0633  BP: 110/74 106/81 112/86 116/78  Pulse: 88 88 86 82  Temp: 97.7 F (36.5 C) 97.8 F (36.6 C) 97.9 F (36.6 C) 97.9 F (36.6 C)  TempSrc: Oral Oral Oral Oral  Resp: 20 22 20 22   Height: 5\' 10"  (1.778 m)     Weight: 324 lb 4.8 oz (147.102 kg)   326 lb 3.1 oz (147.96 kg)  SpO2: 100% 97% 100% 100%   Weight change:   Intake/Output Summary (Last 24 hours) at 01/07/15 [id number] Last data filed at 01/07/15 0600  Gross per 24 hour  Intake 1283.33 ml  Output   1950 ml  Net -666.67 ml   General: Obese AA female, alert, cooperative, NAD. Diaphoretic.  HEENT: PERRL, EOMI. Moist mucus membranes Neck: Full range of motion without pain, supple, no lymphadenopathy or carotid bruits Lungs: Clear to ascultation bilaterally, normal work of respiration, no wheezes, rales, rhonchi Heart: regular rate and rhythm, 2/6 systolic murmur. No gallops, or rubs Abdomen: Soft, non-tender, non-distended, BS + Extremities: No cyanosis, clubbing, or edema. POD2 s/p I&D of right perianal abscess- currently packed with kerlex, dressings being changed every day, on IV abx  Lab Results: @LABTEST2 @ CBC Latest Ref Rng 01/06/2015 01/05/2015 01/04/2015  WBC 4.0 - 10.5 K/uL 13.6(H) 13.0(H) 12.6(H)  Hemoglobin 12.0 - 15.0 g/dL 9.4(L) 10.1(L) 9.9(L)  Hematocrit 36.0 - 46.0 % 28.7(L) 30.1(L) 29.9(L)  Platelets 150 - 400 K/uL 223 222 236    BMP Latest Ref Rng 01/07/2015 01/06/2015 01/06/2015  Glucose 65 - 99 mg/dL 149(H) 478(H) 450(H)  BUN 6 - 20 mg/dL 38(H) 36(H) 32(H)  Creatinine 0.44 - 1.00 mg/dL 1.19(H) 1.34(H) 1.52(H)  Sodium 135 - 145 mmol/L 133(L) 128(L)  125(L)  Potassium 3.5 - 5.1 mmol/L 4.1 4.5 5.3(H)  Chloride 101 - 111 mmol/L 101 95(L) 93(L)  CO2 22 - 32 mmol/L 22 22 17(L)  Calcium 8.9 - 10.3 mg/dL 8.6(L) 8.2(L) 8.2(L)   CBG (last 3)   Recent Labs  01/07/15 0612 01/07/15 0721 01/07/15 0922  GLUCAP 166* 168* 140*      Micro Results: Recent Results (from the past 240 hour(s))  MRSA PCR Screening     Status: None   Collection Time: 01/04/15 10:33 PM  Result Value Ref Range Status   MRSA by PCR NEGATIVE NEGATIVE Final    Comment:        The GeneXpert MRSA Assay (FDA approved for NASAL specimens only), is one component of a comprehensive MRSA colonization surveillance program. It is not intended to diagnose MRSA infection nor to guide or monitor treatment for MRSA infections.   Culture, blood (routine x 2)     Status: None (Preliminary result)   Collection Time: 01/05/15  5:27 AM  Result Value Ref Range Status   Specimen Description BLOOD RIGHT HAND  Final   Special Requests IN PEDIATRIC BOTTLE 1CC  Final   Culture NO GROWTH 1 DAY  Final   Report Status PENDING  Incomplete  Culture, blood (routine x 2)     Status: None (Preliminary result)   Collection Time: 01/05/15  5:31 AM  Result Value Ref Range Status   Specimen Description BLOOD RIGHT ANTECUBITAL  Final   Special Requests IN PEDIATRIC BOTTLE 3CC  Final   Culture NO GROWTH 1 DAY  Final   Report Status PENDING  Incomplete  Anaerobic culture     Status: None (Preliminary result)   Collection Time: 01/05/15  1:07 PM  Result Value Ref Range Status   Specimen Description ABSCESS BUTTOCKS  Final   Special Requests PT ON ZOSYN  Final   Gram Stain   Final    ABUNDANT WBC PRESENT,BOTH PMN AND MONONUCLEAR NO SQUAMOUS EPITHELIAL CELLS SEEN ABUNDANT GRAM NEGATIVE RODS FEW GRAM POSITIVE COCCI IN PAIRS    Culture   Final    NO ANAEROBES ISOLATED; CULTURE IN PROGRESS FOR 5 DAYS Performed at [facility]    Report Status PENDING  Incomplete  Culture,  routine-abscess     Status: None (Preliminary result)   Collection Time: 01/05/15  1:07 PM  Result Value Ref Range Status   Specimen Description ABSCESS BUTTOCKS  Final   Special Requests PT ON ZOSYN  Final   Gram Stain   Final    ABUNDANT WBC PRESENT,BOTH PMN AND MONONUCLEAR NO SQUAMOUS EPITHELIAL CELLS SEEN ABUNDANT GRAM NEGATIVE RODS FEW GRAM POSITIVE COCCI IN PAIRS    Culture   Final    NO GROWTH 1 DAY Performed at [facility]    Report Status PENDING  Incomplete  Culture, blood (routine x 2)     Status: None (Preliminary result)   Collection Time: 01/06/15  3:07 PM  Result Value Ref Range Status   Specimen Description BLOOD LEFT ANTECUBITAL  Final   Special Requests BOTTLES DRAWN AEROBIC ONLY  5CC  Final   Culture PENDING  Incomplete   Report Status PENDING  Incomplete   Studies/Results: No results found. Medications: I have reviewed the patient's current medications. Scheduled Meds: . docusate sodium  100 mg Oral BID  . doxycycline  100 mg Oral Q12H  . FLUoxetine  20 mg Oral Daily  . heparin  5,000 Units Subcutaneous 3 times per day  . insulin aspart  0-15 Units Subcutaneous TID WC  . insulin aspart  0-5 Units Subcutaneous QHS  . insulin aspart  8 Units Subcutaneous TID WC  . insulin glargine  40 Units Subcutaneous Daily  . levothyroxine  25 mcg Oral QAC breakfast  . piperacillin-tazobactam (ZOSYN)  IV  3.375 g Intravenous 3 times per day  . rosuvastatin  10 mg Oral QHS  . sodium chloride  3 mL Intravenous Q12H   Continuous Infusions: . lactated ringers 10 mL/hr at 01/05/15 1133   PRN Meds:.dextrose, diphenhydrAMINE, HYDROmorphone (DILAUDID) injection, ondansetron **OR** ondansetron (ZOFRAN) IV Assessment/Plan: Principal Problem:   Abscess of right buttock Active Problems:   Essential hypertension   Diabetic peripheral neuropathy   Medically noncompliant   Type 2 diabetes mellitus with moderate nonproliferative diabetic retinopathy with macular  edema   Hyperglycemia  Sepsis secondary to Abcess of right buttock-likely related to her poorly controlled diabetes. -noted to have a low grade fever (101.4), tachycardia, and mild leukocytosis on admission, fever had resolved with most recent being 98.9  -POD2 s/p I&d ,6x5 cm- has daily wound care  -Wound cultures grew gram negative rods , and few gram positive cocci in pairs  -Will change to appropriate abx pending the cultures -blood cultures pending -Reinstated Zosyn 3.375g IV q8h- per pharmacy. Patient has allergy to vancomycin, we d/c the clindamycin  -Doxycycline 100 mg BID -zofran 4mg  q6h prn for nausea percoset 7.5-325 q4 for pain, d/c dilaudid  Adrenal insufficiency- Hyponatremia and Hyperkalemia yesterday, will order ACTH to determine primary vs secondary  Yesterday cortisol was on the low range, repeat cortisol 1.4 -Ordered ACTH -Hydrocortisone 40 mg AM, 20 mg night   Hyperglycemia/mild DKA- glucose of 610 on admission with mild acidosis and anion gap of 15 This AM- anion gap has closed , sodium 133 and K 4.1 -Patient off of insulin drip -Increased Lantus to 40 units daily, Novolog 8 units with meals for tomorrow -on SSI q4 -consult to care management placed. Patient appears to have lack of insight into her medical problems and claims inability to afford insulin and other medications   Voiding difficulty and constipation: Likely due to the dilaudids- decrease the frequency -UOP is about 400 mL -Miralax and senna -   AKI- improving  -Creatine of 1.85 on admission --> 1.2  Hypertension- stable -on HCTZ 25mg  daily and Benicar 20mg  daily. -held for now given her soft BP  Hyperlipidemia -continue Crestor 10mg  daily  Hypothyroidism- stable  -TSH 4 -continue Synthroid 84mcg daily  Iron deficiency anemia -anemia panel from February 2016: iron 36, TIBC 243, ferritin 96, folate 16, vitamin b12 411 -contributing factors could be hypothyroidism, anemia  chronic disease, history of heavy menses. Patient was referred to [facility] for evaluation of heavy menses in February 2016 but does not appear to have followed up with that  Depression -continue Prozac 20mg  daily  Diet: NPO  DVT: heparin Dispo: Disposition is deferred at this time, awaiting improvement of current medical problems.  Anticipated discharge in approximately 2 day(s).   The patient does have a current PCP Milagros Loll, MD) and does not need an [facility] hospital follow-up appointment after discharge.  The patient does not have transportation limitations that hinder transportation to clinic appointments.  .Services Needed at time of discharge: Y = Yes, Blank = No PT:   OT:   RN:   Equipment:   Other:     LOS: 3 days   Burgess Estelle, MD 01/07/2015, 9:38 AM

## 2015-01-07 NOTE — Progress Notes (Signed)
ANTIBIOTIC CONSULT NOTE - FOLLOW UP  Pharmacy Consult for Zosyn Indication: buttock abscess  Allergies  Allergen Reactions  . Vancomycin Other (See Comments)    Acute renal failure suspected secondary to vanco    Patient Measurements: Height: 5\' 10"  (177.8 cm) Weight: (!) 326 lb 3.1 oz (147.96 kg) IBW/kg (Calculated) : 68.5  Vital Signs: Temp: 97.9 F (36.6 C) (08/17 0900) Temp Source: Oral (08/17 0900) BP: 112/69 mmHg (08/17 0900) Pulse Rate: 88 (08/17 0900) Intake/Output from previous day: 08/16 0701 - 08/17 0700 In: 1523.3 [P.O.:960; I.V.:463.3; IV Piggyback:100] Out: 1950 [Urine:1950] Intake/Output from this shift: Total I/O In: 240 [P.O.:240] Out: 300 [Urine:300]  Labs:  Recent Labs  01/04/15 1753  01/05/15 1024 01/06/15 0550 01/06/15 1715 01/07/15 0833  WBC 12.6*  --  13.0* 13.6*  --   --   HGB 9.9*  --  10.1* 9.4*  --   --   PLT 236  --  222 223  --   --   CREATININE 1.85*  < >  --  1.52* 1.34* 1.19*  < > = values in this interval not displayed. Estimated Creatinine Clearance: 101.5 mL/min (by C-G formula based on Cr of 1.19).  Assessment: 39yof continues on day #3 zosyn (and day#2 doxcycline) for her buttock abscess, s/p I&D on 8/15. Renal function improved since antibiotic initiation. Doses remain appropriate.  Zosyn 8/15> Clindamycin 8/15 x 1 dose Doxycycline 8/16>  8/16 Blood>> 8/15 Blood>> ngtd 8/15 Abscess>> abundant GNR, few GPC 8/15 Anaerobes>>ngtd  Goal of Therapy:  Appropriate dosing  Plan:  1) Continue zosyn 3.375g IV q8 (4 hour infusion) 2) Follow up cultures  Deboraha Sprang 01/07/2015,2:02 PM

## 2015-01-07 NOTE — Progress Notes (Signed)
Patient ID: [name] [name], female   DOB: 12-19-1975, 39 y.o.   MRN: [id number] 2 Days Post-Op  Subjective: Pt feels ok today, but complains of pain with sitting or standing.  Objective: Vital signs in last 24 hours: Temp:  [97.5 F (36.4 C)-97.9 F (36.6 C)] 97.9 F (36.6 C) (08/17 [id number]) Pulse Rate:  [82-93] 82 (08/17 0633) Resp:  [14-25] 22 (08/17 0633) BP: (91-126)/(55-86) 116/78 mmHg (08/17 0633) SpO2:  [97 %-100 %] 100 % (08/17 0633) Weight:  [147.102 kg (324 lb 4.8 oz)-147.96 kg (326 lb 3.1 oz)] 147.96 kg (326 lb 3.1 oz) (08/17 [id number]) Last BM Date: 01/05/15  Intake/Output from previous day: 08/16 0701 - 08/17 0700 In: 1523.3 [P.O.:960; I.V.:463.3; IV Piggyback:100] Out: 1950 [Urine:1950] Intake/Output this shift:    PE: Skin: wound is mostly clean.  Small amount of fibrin at base of wound.  Induration improving  Lab Results:   Recent Labs  01/05/15 1024 01/06/15 0550  WBC 13.0* 13.6*  HGB 10.1* 9.4*  HCT 30.1* 28.7*  PLT 222 223   BMET  Recent Labs  01/06/15 1715 01/07/15 0833  NA 128* 133*  K 4.5 4.1  CL 95* 101  CO2 22 22  GLUCOSE 478* 149*  BUN 36* 38*  CREATININE 1.34* 1.19*  CALCIUM 8.2* 8.6*   PT/INR No results for input(s): LABPROT, INR in the last 72 hours. CMP     Component Value Date/Time   NA 133* 01/07/2015 0833   K 4.1 01/07/2015 0833   CL 101 01/07/2015 0833   CO2 22 01/07/2015 0833   GLUCOSE 149* 01/07/2015 0833   BUN 38* 01/07/2015 0833   CREATININE 1.19* 01/07/2015 0833   CREATININE 0.86 02/03/2014 1017   CALCIUM 8.6* 01/07/2015 0833   PROT 6.7 01/04/2015 1753   ALBUMIN 2.2* 01/04/2015 1753   AST 29 01/04/2015 1753   ALT 23 01/04/2015 1753   ALKPHOS 181* 01/04/2015 1753   BILITOT 1.3* 01/04/2015 1753   GFRNONAA 57* 01/07/2015 0833   GFRNONAA 83 07/23/2013 1604   GFRAA >60 01/07/2015 0833   GFRAA >89 07/23/2013 1604   Lipase  No results found for: LIPASE     Studies/Results: No results  found.  Anti-infectives: Anti-infectives    Start     Dose/Rate Route Frequency Ordered Stop   01/06/15 1300  piperacillin-tazobactam (ZOSYN) IVPB 3.375 g     3.375 g 12.5 mL/hr over 240 Minutes Intravenous 3 times per day 01/06/15 1148     01/06/15 1000  doxycycline (VIBRA-TABS) tablet 100 mg     100 mg Oral Every 12 hours 01/06/15 0726     01/05/15 1000  piperacillin-tazobactam (ZOSYN) IVPB 3.375 g  Status:  Discontinued     3.375 g 12.5 mL/hr over 240 Minutes Intravenous Every 8 hours 01/05/15 0358 01/06/15 0922   01/05/15 1000  clindamycin (CLEOCIN) IVPB 600 mg  Status:  Discontinued     600 mg 100 mL/hr over 30 Minutes Intravenous Every 8 hours 01/05/15 0401 01/05/15 1357   01/04/15 2200  piperacillin-tazobactam (ZOSYN) IVPB 3.375 g  Status:  Discontinued     3.375 g 12.5 mL/hr over 240 Minutes Intravenous Every 8 hours 01/04/15 2053 01/05/15 0358   01/04/15 2200  clindamycin (CLEOCIN) IVPB 600 mg  Status:  Discontinued     600 mg 100 mL/hr over 30 Minutes Intravenous Every 8 hours 01/04/15 2100 01/05/15 0401       Assessment/Plan  POD 2, s/p I&D of buttock abscess -cont abx therapy, CX show GNR  and GPC.  Can likely be switched to oral doxy for a 7day total course of treatment -cont dressing changes BID -HH arranged. -surgically stable for DC when medically stable  LOS: 3 days    Madalena Kesecker E 01/07/2015, 9:53 AM Pager: [phone]

## 2015-01-08 LAB — GLUCOSE, CAPILLARY
GLUCOSE-CAPILLARY: 254 mg/dL — AB (ref 65–99)
GLUCOSE-CAPILLARY: 371 mg/dL — AB (ref 65–99)
Glucose-Capillary: 337 mg/dL — ABNORMAL HIGH (ref 65–99)
Glucose-Capillary: 277 mg/dL — ABNORMAL HIGH (ref 65–99)

## 2015-01-08 MED ORDER — HYDROCORTISONE 10 MG PO TABS
10.0000 mg | ORAL_TABLET | Freq: Every evening | ORAL | Status: DC
  Filled 2015-01-08: qty 1

## 2015-01-08 MED ORDER — INSULIN ASPART 100 UNIT/ML ~~LOC~~ SOLN
10.0000 [IU] | Freq: Three times a day (TID) | SUBCUTANEOUS | Status: DC
  Administered 2015-01-08: 10 [IU] via SUBCUTANEOUS

## 2015-01-08 MED ORDER — INSULIN ASPART 100 UNIT/ML ~~LOC~~ SOLN: 15.0000 [IU] | Freq: Three times a day (TID) | SUBCUTANEOUS | Status: DC

## 2015-01-08 MED ORDER — OXYCODONE-ACETAMINOPHEN 10-325 MG PO TABS: 1.0000 | ORAL_TABLET | Freq: Four times a day (QID) | ORAL | Status: DC | PRN

## 2015-01-08 MED ORDER — HYDROCORTISONE 20 MG PO TABS
20.0000 mg | ORAL_TABLET | ORAL | Status: DC
  Filled 2015-01-08: qty 1

## 2015-01-08 MED ORDER — ROSUVASTATIN CALCIUM 10 MG PO TABS: 10.0000 mg | ORAL_TABLET | Freq: Every day | ORAL | Status: DC

## 2015-01-08 MED ORDER — CIPROFLOXACIN HCL 500 MG PO TABS: 500.0000 mg | ORAL_TABLET | Freq: Two times a day (BID) | ORAL | Status: DC

## 2015-01-08 MED ORDER — INSULIN GLARGINE 100 UNIT/ML ~~LOC~~ SOLN: 50.0000 [IU] | Freq: Every day | SUBCUTANEOUS | Status: DC

## 2015-01-08 MED ORDER — HYDROCORTISONE 20 MG PO TABS: 20.0000 mg | ORAL_TABLET | ORAL | Status: DC

## 2015-01-08 MED ORDER — DOXYCYCLINE HYCLATE 100 MG PO TABS: 100.0000 mg | ORAL_TABLET | Freq: Two times a day (BID) | ORAL | Status: DC

## 2015-01-08 MED ORDER — INSULIN GLARGINE 100 UNIT/ML ~~LOC~~ SOLN
50.0000 [IU] | Freq: Every day | SUBCUTANEOUS | Status: DC
Start: 2015-01-08 — End: 2015-01-08
  Administered 2015-01-08: 50 [IU] via SUBCUTANEOUS
  Filled 2015-01-08: qty 0.5

## 2015-01-08 MED ORDER — OXYCODONE HCL 5 MG PO TABS
5.0000 mg | ORAL_TABLET | ORAL | Status: DC | PRN
  Administered 2015-01-08: 5 mg via ORAL
  Filled 2015-01-08: qty 1

## 2015-01-08 MED ORDER — OXYCODONE-ACETAMINOPHEN 5-325 MG PO TABS
1.0000 | ORAL_TABLET | ORAL | Status: DC | PRN
  Administered 2015-01-08: 1 via ORAL
  Filled 2015-01-08: qty 1

## 2015-01-08 MED ORDER — HYDROCORTISONE 10 MG PO TABS: 10.0000 mg | ORAL_TABLET | Freq: Every evening | ORAL | Status: DC

## 2015-01-08 MED ORDER — CIPROFLOXACIN HCL 500 MG PO TABS
500.0000 mg | ORAL_TABLET | Freq: Two times a day (BID) | ORAL | Status: DC
  Administered 2015-01-08: 500 mg via ORAL
  Filled 2015-01-08: qty 1

## 2015-01-08 MED ORDER — SENNA 8.6 MG PO TABS
2.0000 | ORAL_TABLET | Freq: Every day | ORAL | Status: DC
Start: 2015-01-08 — End: 2015-08-13

## 2015-01-08 MED ORDER — POLYETHYLENE GLYCOL 3350 17 G PO PACK: 17.0000 g | PACK | Freq: Every day | ORAL | Status: DC | PRN

## 2015-01-08 NOTE — Discharge Summary (Signed)
Name: [name] [name] MRN: [id number] DOB: Dec 13, 1975 39 y.o. PCP: Milagros Loll, MD  Date of Admission: 01/04/2015  6:03 PM Date of Discharge: 01/08/2015 Attending Physician: Axel Filler, MD  Discharge Diagnosis: 1. I&D of abscess, DKA, and adrenal insufficiency Principal Problem:   Abscess of right buttock Active Problems:   Essential hypertension   Diabetic peripheral neuropathy   Medically noncompliant   Type 2 diabetes mellitus with moderate nonproliferative diabetic retinopathy with macular edema   Hyperglycemia   Adrenal insufficiency  Discharge Medications:   Medication List    STOP taking these medications        diphenhydrAMINE 25 mg capsule  Commonly known as:  BENADRYL     ibuprofen 600 MG tablet  Commonly known as:  ADVIL,MOTRIN     oxyCODONE-acetaminophen 5-325 MG per tablet  Commonly known as:  PERCOCET/ROXICET  Replaced by:  oxyCODONE-acetaminophen 10-325 MG per tablet      TAKE these medications        blood glucose meter kit and supplies  Check blood sugar up to 3 times a day dx code 250.02 insulin requiring     busPIRone 10 MG tablet  Commonly known as:  BUSPAR  Take 1 tablet (10 mg total) by mouth 2 (two) times daily.     ciprofloxacin 500 MG tablet  Commonly known as:  CIPRO  Take 1 tablet (500 mg total) by mouth 2 (two) times daily.     doxycycline 100 MG tablet  Commonly known as:  VIBRA-TABS  Take 1 tablet (100 mg total) by mouth every 12 (twelve) hours.     FLUoxetine 20 MG tablet  Commonly known as:  PROZAC  Take 1 tablet (20 mg total) by mouth daily.     gabapentin 300 MG capsule  Commonly known as:  NEURONTIN  Take 300 mg in the morning and lunch and 600 mg at night     glucose blood test strip  Commonly known as:  WAVESENSE PRESTO  Check blood sugar up to 3 times a day dx code 250.02 insulin requiring     hydrochlorothiazide 25 MG tablet  Commonly known as:  HYDRODIURIL  Take 1 tablet (25 mg total) by  mouth daily.     hydrocortisone 10 MG tablet  Commonly known as:  CORTEF  Take 1 tablet (10 mg total) by mouth every evening.     hydrocortisone 20 MG tablet  Commonly known as:  CORTEF  Take 1 tablet (20 mg total) by mouth every morning.  Start taking on:  01/09/2015     insulin aspart 100 UNIT/ML injection  Commonly known as:  novoLOG  Inject 15 Units into the skin 3 (three) times daily with meals.     insulin glargine 100 UNIT/ML injection  Commonly known as:  LANTUS  Inject 0.5 mLs (50 Units total) into the skin at bedtime.     levothyroxine 25 MCG tablet  Commonly known as:  SYNTHROID, LEVOTHROID  Take 1 tablet (25 mcg total) by mouth daily before breakfast.     metFORMIN 1000 MG tablet  Commonly known as:  GLUCOPHAGE  Take 0.5 tablets (500 mg total) by mouth daily with breakfast.     olmesartan 20 MG tablet  Commonly known as:  BENICAR  Take 1 tablet (20 mg total) by mouth daily.     oxyCODONE-acetaminophen 10-325 MG per tablet  Commonly known as:  PERCOCET  Take 1 tablet by mouth every 6 (six) hours as needed for pain.  polyethylene glycol packet  Commonly known as:  MIRALAX / GLYCOLAX  Take 17 g by mouth daily as needed for mild constipation (constipation).     rosuvastatin 10 MG tablet  Commonly known as:  CRESTOR  Take 1 tablet (10 mg total) by mouth at bedtime.     senna 8.6 MG Tabs tablet  Commonly known as:  SENOKOT  Take 2 tablets (17.2 mg total) by mouth daily.        Disposition and follow-up:   [name] [name] was discharged from [facility] in Good condition.  At the hospital follow up visit please address:   (1) Patient needs close monitoring of her blood sugars- She presented with DKA and we have increased her Lantus to 50 units and Novolog to 15 units TID. This is even more necessary now that we have started her on hydrocortisone 20 mg AM and 10 mg PM, for her adrenal insufficiency.  Please recheck the BMET to  check her potassium as she had mild hyperkalemia during this hospital course   (2) Patient needs endocrinology referral- She has T2DM since the age of 30 who now presented with DKA, along with hypothyroidism, and complained of fatigue for many months. This along with newly diagnosed adrenal insufficiency during this hospital course after two sets of low AM cortisol levels- likely primary adrenal insufficiency- warrants further workup. Moreover, her blood sugars have been very poorly controlled with last A1C of 15. On close exam- patient does have some hyperpigmentation- esp along the palmar creases and on the upper gingiva. May want to check antiislet antibodies and GAd- though unlikely that she has type 1 diabetes.   (3) Regarding wound care- has she completed her cipro and doxy- and has wound care been coming to change the dressings? Please follow up on the blood and wound cultures- they are still pending   (4) Anemia- likely IDA- with Hgb/MCV being 9.4/78 she needs further workup and likely iron supplementation   (5) Depression- she looked very depressed and it seemed to [place], and was also noted by the case manager that she is having a hard time coping with all of these changes.  (6) Hypothyroidism- may want to repeat TSH in 3 months    2.  Labs / imaging needed at time of follow-up: CBC, BMET, CBG, might be worth checking antiislet antibodies and GAD- or we can let endocrinology do that workup.  3.  Pending labs/ test needing follow-up: ACTH  Follow-up Appointments:     Follow-up Information    Follow up with [facility].   Why:  they will send a nurse to your home   Contact information:   [address] [place] [address] [phone]       Follow up with Maia Petties., MD. Schedule an appointment as soon as possible for a visit on 01/30/2015.   Specialty:  General Surgery   Why:  @ 9:00 ( Patient to arrive at 8:30 Claiborne Billings)   Contact information:   [place] [address] [phone]       Follow up with Albin Felling, MD. Go on 01/14/2015.   Specialty:  Internal Medicine   Why:  at 3:45 for hospital follow up   Contact information:   [place] [address] [phone]       Discharge Instructions: Discharge Instructions    Diet - low sodium heart healthy    Complete  by:  As directed      Discharge instructions    Complete by:  As directed   Please follow up in our clinic next Wednesday at 3:45 PM Please follow up in the surgery clinic in about 3 weeks (call to make the appointment) The wound care nurse will come every day and then determine how often they will coe  Please take 50 units of lantus Please take 15 units of Novolog with meals  Please take 20 mg of cortef (hydrocortisone) in morning and 10 mg of cortef in the evening  Please take senna as needed for constipation     Increase activity slowly    Complete by:  As directed            Consultations:    Procedures Performed:  No results found.  2D Echo:   Cardiac Cath:   Admission HPI:  [name] [name] is a 39 y.o. female w/ PMHx of DM type II, Anemia of Chronic Disease, Hypothyroidism, GERD, anxiety, depression, and h/o medication non-compliance, presents to the ED w/ complaints of right buttock pain and drainage. Patient states she started having pain back on Wednesday (5 days ago) and felt that she was developing an infection. She noticed that the area became larger and more and more painful and became unbearable today causing her to come to the ED. She also noticed the area started to drain as well. She states she is unable to sit d/t the pain. She also endorses subjective fever, chills, nausea, and diaphoresis. Patient states she has had this in the past before as well. [name] [name] has very poorly controlled DM type II and states that she has not taken any of her medications in the past month. She has checked her  CBG's at home a few times in the recent past and says it is typically in the 300-400 range. She has been using a bit of Novolog from time to time, but otherwise has not been taking anything else. She denies polyuria or polydipsia.   While in the ED, patient was noted to have a very large right buttock abscess extending anteriorl into her perineum. She was also noted to have a low grade fever (101.4), tachycardia, and mild leukocytosis. CBG was found to be 610, HCO3 20, w/ high normal AG of 15. Cr increased from baseline to 1.85. Started on Clindamycin (Vancomycin allergy) and Zosyn, Insulin gtt, and surgery was consulted.   Hospital Course by problem list: Principal Problem:   Abscess of right buttock Active Problems:   Essential hypertension   Diabetic peripheral neuropathy   Medically noncompliant   Type 2 diabetes mellitus with moderate nonproliferative diabetic retinopathy with macular edema   Hyperglycemia   Adrenal insufficiency   1.  T2DM:  On admission, the glucose was 610, with mild acidosis and AG of 15, and so she was put on insulin drip. Her A1C was 15.3. SHe had mentioned that she did not take any of her diabetes medications in the past month but at home she was on Lantus 23 units qhs, metformin 500 mg daily, and novolog 7u tid. She was given NS IV fluid bolus of 1L and then put on maintenance fluids at 116m/hr. Her blood sugars then were better the next day and the AG had closed, and she was stable for surgery I&D, and she was taken off of the insulin drip. however the next day, her CBGs rose again and so she was put back on insulin gtt,  given several fluid boluses, and then eventually, at discharge, her Lantus was adjusted to 50 units daily, novolog 15 units, and she was asked to continue the metformin. Her CBG on discharge was 254.   Abscess: She had 3/4 SIRS criteria in the ER secondary to the large abscess. Patient has history of recurrent abscesses. On admission, a large area  of induration, and purulent drainage was noted on the right buttock. Once she was admitted, her CBGs were better controlled with insulin gtt, and so she was taken in the OR for I&D.  The abscess cavity was noted to be about 6x5 cm in the op note. Then daily wound care was done and the dressings were changed everyday. Blood cultures , and the wound culture were obtained on admission and in the OR. The blood culture has shown NGTD, but the wound culture grew abundant gram negative rods, and few gram positive cocci- so likely polymicrobial in light of the poorly controlled diabetes- there was also concern for pseudomonas. She was given IV clindamycin and IV Zosyn on admission. The zosyn was continued, throughout the hospital course, and doxycycline was added. She was then sent home on oral doxycycline and oral ciprofloxacin (for Paeruginosa coverage) and she remained afebrile throughout the course. Her pain was adequately controlled with dilaudid 1 mg q3h for 2 days Postop, and then on percoset.   Adrenal insufficiency: On 8/16 morning BMP, she was found to be hyponatremic and hyperkalemic. Her Na was 125, but her corrected Na was still 131 and K of 5.5 even after being on insulin gtt. AM cortisol was obtained, which was low, and repeat AM cortisol was again <3, making the diagnosis of adrenal insufficiency. The ACTH is pending. On further examination, skin changes were also evident and hyperpigmentation was present.  She was given a stress dose of hydrocortisone 40 mg AM, 20 mg PM, which was changed to 20 mg AM, and 10 mg PM at discharge. 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  HTN: Her blood pressure stayed pretty stable- She was mildly hypertensive after receiving hydrocortisone, likely due to the same. Her home BP meds may need to be adjusted because she is now getting steroids.     AKI: On admission, the creatinine was 1.85 which had gone down to 1.2 after receiving IV fluids - baseline around 1.  Hypothyroidism: Repeat TSH was high-normal at 4.0. May want to repeat TSH in 3 months once her current problems have stabilized. Her synthroid was continued.  Her other medical conditions were stable.   Discharge Vitals:   BP 150/87 mmHg  Pulse 78  Temp(Src) 97.6 F (36.4 C) (Oral)  Resp 18  Ht '5\' 10"'  (1.778 m)  Wt 324 lb 1.6 oz (147.011 kg)  BMI 46.50 kg/m2  SpO2 100%  LMP 11/27/2014  Discharge Labs:  Results for orders placed or performed during the hospital encounter of 01/04/15 (from the past 24 hour(s))  Glucose, capillary     Status: Abnormal   Collection Time: 01/07/15  4:42 PM  Result Value Ref Range   Glucose-Capillary 344 (H) 65 - 99 mg/dL   Comment 1 Notify RN    Comment 2 Document in Chart   Glucose, capillary     Status: Abnormal   Collection Time: 01/07/15  9:31 PM  Result Value Ref Range   Glucose-Capillary 436 (H) 65 - 99 mg/dL  Glucose, capillary     Status: Abnormal   Collection Time: 01/08/15 12:35 AM  Result Value Ref Range   Glucose-Capillary 371 (H) 65 - 99 mg/dL  Glucose, capillary     Status: Abnormal   Collection Time: 01/08/15  2:08 AM  Result Value Ref Range   Glucose-Capillary 337 (H) 65 - 99 mg/dL  Glucose, capillary     Status: Abnormal   Collection Time: 01/08/15  6:14 AM  Result Value Ref Range   Glucose-Capillary 277 (H) 65 - 99 mg/dL  Glucose, capillary     Status: Abnormal   Collection Time: 01/08/15 11:28 AM  Result Value Ref Range   Glucose-Capillary 254 (H) 65 - 99 mg/dL   Comment 1 Notify RN     Signed: Burgess Estelle, MD 01/08/2015, 11:48 AM    Services Ordered on Discharge:  Equipment Ordered on Discharge:

## 2015-01-08 NOTE — Progress Notes (Signed)
Subjective: No acute events overnight Pt complained of increased pain since we d/c the dilaudid and switched to percoset I explained the rationale behind discontinuing the dilaudid as she is now POD3 and she was having a lot of constipation and other side effects.  She is voiding well and the senna is helping her.  Explained to her likely d/c afternoon with oral doxy and oral cipro with HHN for wound care, and follow up in endocrinology clinic  Objective: Vital signs in last 24 hours: Filed Vitals:   01/07/15 0900 01/07/15 2019 01/08/15 0142 01/08/15 0739  BP: 112/69 123/78 148/83 150/87  Pulse: 88 83 80 78  Temp: 97.9 F (36.6 C) 98.2 F (36.8 C)  97.6 F (36.4 C)  TempSrc: Oral Oral  Oral  Resp: 20 18 18 18   Height:      Weight:    324 lb 1.6 oz (147.011 kg)  SpO2: 100% 100% 100% 100%   Weight change:   Intake/Output Summary (Last 24 hours) at 01/08/15 0850 Last data filed at 01/08/15 [id number]  Gross per 24 hour  Intake   1480 ml  Output   2500 ml  Net  -1020 ml   General: Obese AA female, alert, cooperative, NAD. Diaphoretic.  HEENT: PERRL, EOMI. Moist mucus membranes Neck: Full range of motion without pain, supple, no lymphadenopathy or carotid bruits Lungs: Clear to ascultation bilaterally, normal work of respiration, no wheezes, rales, rhonchi Heart: regular rate and rhythm, 2/6 systolic murmur. No gallops, or rubs Abdomen: Soft, non-tender, non-distended, BS + Extremities: No cyanosis, clubbing, or edema. POD4 s/p I&D of right perianal abscess- currently packed with kerlex, dressings being changed every day, on IV abx Skin: has hyperpigmented spots and the palmar creases are hyperpigmented, with some on the upper right gingiva.   Lab Results: @LABTEST2 @ Micro Results: Recent Results (from the past 240 hour(s))  MRSA PCR Screening     Status: None   Collection Time: 01/04/15 10:33 PM  Result Value Ref Range Status   MRSA by PCR NEGATIVE NEGATIVE Final   Comment:        The GeneXpert MRSA Assay (FDA approved for NASAL specimens only), is one component of a comprehensive MRSA colonization surveillance program. It is not intended to diagnose MRSA infection nor to guide or monitor treatment for MRSA infections.   Culture, blood (routine x 2)     Status: None (Preliminary result)   Collection Time: 01/05/15  5:27 AM  Result Value Ref Range Status   Specimen Description BLOOD RIGHT HAND  Final   Special Requests IN PEDIATRIC BOTTLE 1CC  Final   Culture NO GROWTH 2 DAYS  Final   Report Status PENDING  Incomplete  Culture, blood (routine x 2)     Status: None (Preliminary result)   Collection Time: 01/05/15  5:31 AM  Result Value Ref Range Status   Specimen Description BLOOD RIGHT ANTECUBITAL  Final   Special Requests IN PEDIATRIC BOTTLE 3CC  Final   Culture NO GROWTH 2 DAYS  Final   Report Status PENDING  Incomplete  Anaerobic culture     Status: None (Preliminary result)   Collection Time: 01/05/15  1:07 PM  Result Value Ref Range Status   Specimen Description ABSCESS BUTTOCKS  Final   Special Requests PT ON ZOSYN  Final   Gram Stain   Final    ABUNDANT WBC PRESENT,BOTH PMN AND MONONUCLEAR NO SQUAMOUS EPITHELIAL CELLS SEEN ABUNDANT GRAM NEGATIVE RODS FEW GRAM POSITIVE COCCI IN PAIRS  Culture   Final    NO ANAEROBES ISOLATED; CULTURE IN PROGRESS FOR 5 DAYS Performed at [facility]    Report Status PENDING  Incomplete  Culture, routine-abscess     Status: None (Preliminary result)   Collection Time: 01/05/15  1:07 PM  Result Value Ref Range Status   Specimen Description ABSCESS BUTTOCKS  Final   Special Requests PT ON ZOSYN  Final   Gram Stain   Final    ABUNDANT WBC PRESENT,BOTH PMN AND MONONUCLEAR NO SQUAMOUS EPITHELIAL CELLS SEEN ABUNDANT GRAM NEGATIVE RODS FEW GRAM POSITIVE COCCI IN PAIRS    Culture   Final    NO GROWTH 1 DAY Performed at [facility]    Report Status PENDING  Incomplete    Culture, blood (routine x 2)     Status: None (Preliminary result)   Collection Time: 01/06/15  3:00 PM  Result Value Ref Range Status   Specimen Description BLOOD LEFT FOREARM  Final   Special Requests IN PEDIATRIC BOTTLE 3CC  Final   Culture NO GROWTH < 24 HOURS  Final   Report Status PENDING  Incomplete  Culture, blood (routine x 2)     Status: None (Preliminary result)   Collection Time: 01/06/15  3:07 PM  Result Value Ref Range Status   Specimen Description BLOOD LEFT ANTECUBITAL  Final   Special Requests BOTTLES DRAWN AEROBIC ONLY 5CC  Final   Culture NO GROWTH < 24 HOURS  Final   Report Status PENDING  Incomplete   Studies/Results: No results found. Medications: I have reviewed the patient's current medications. Scheduled Meds: . docusate sodium  100 mg Oral BID  . doxycycline  100 mg Oral Q12H  . FLUoxetine  20 mg Oral Daily  . heparin  5,000 Units Subcutaneous 3 times per day  . hydrocortisone  40 mg Oral [id number]   And  . hydrocortisone  20 mg Oral QPM  . insulin aspart  0-20 Units Subcutaneous TID WC  . insulin aspart  0-5 Units Subcutaneous QHS  . insulin aspart  10 Units Subcutaneous TID WC  . insulin glargine  50 Units Subcutaneous Daily  . levothyroxine  25 mcg Oral QAC breakfast  . piperacillin-tazobactam (ZOSYN)  IV  3.375 g Intravenous 3 times per day  . rosuvastatin  10 mg Oral QHS  . senna  2 tablet Oral Daily  . sodium chloride  3 mL Intravenous Q12H   Continuous Infusions: . lactated ringers 10 mL/hr at 01/05/15 1133   PRN Meds:.dextrose, diphenhydrAMINE, ondansetron **OR** ondansetron (ZOFRAN) IV, oxyCODONE-acetaminophen **AND** oxyCODONE, polyethylene glycol Assessment/Plan: Principal Problem:   Abscess of right buttock Active Problems:   Essential hypertension   Diabetic peripheral neuropathy   Medically noncompliant   Type 2 diabetes mellitus with moderate nonproliferative diabetic retinopathy with macular edema   Hyperglycemia   Adrenal  insufficiency Sepsis secondary to Abcess of right buttock-likely related to her poorly controlled diabetes. -noted to have a low grade fever (101.4), tachycardia, and mild leukocytosis on admission, fever had resolved with most recent being 98.9  -POD3 s/p I&d ,6x5 cm- has daily wound care  -Wound cultures grew gram negative rods , and few gram positive cocci in pairs  -Will send home on PO doxy and PO cipro to cover for p.aeruginosa -will set up [facility] for wound care -will follow up i surgery clinic in 3 weeks  -blood cultures pending -zofran 4mg  q6h prn for nausea percoset 7.5-325 q4 for pain  Adrenal insufficiency- Hyponatremia and Hyperkalemia  yesterday,  Yesterday cortisol was on the low range, repeat cortisol 1.4 -ACTH pending -has skin changes with hyperpigmented skin on exam - Recd Hydrocortisone 40 mg AM, 20 mg yesterday WIll send home on 20 mg cortef AM, and 20 mg cortef PM -will coordinate with endocrine in the outpatient setting   Hyperglycemia/mild DKA- glucose of 610 on admission with mild acidosis and anion gap of 15 -CBGS 250-400 yesterday, likely due to the stress dose of steroids received  -Increased Lantus to 50 units daily, Novolog 15 units with meals -on SSI q4 -contacted Ms. Butch Penny for diabetes education, especially now that the patient is on steroids   Voiding difficulty and constipation: Resolved -says senna is helping her -UPO about 1000 ML, has good BM   AKI- improving  -Creatine of 1.85 on admission --> 1.2  Hypertension- mildly HTN yesterday likely due to steroids initiation -on HCTZ 25mg  daily and Benicar 20mg  daily. -held for now given her soft BP  Hyperlipidemia -continue Crestor 10mg  daily  Hypothyroidism- stable  -TSH 4 -continue Synthroid 56mcg daily  Iron deficiency anemia -anemia panel from February 2016: iron 36, TIBC 243, ferritin 96, folate 16, vitamin b12 411 -contributing factors could be hypothyroidism, anemia chronic disease,  history of heavy menses. Patient was referred to [facility] for evaluation of heavy menses in February 2016 but does not appear to have followed up with that  Depression -continue Prozac 20mg  daily   Dispo: Disposition is deferred at this time, awaiting improvement of current medical problems.  Anticipated discharge in approximately 0 day(s).   The patient does have a current PCP Milagros Loll, MD) and does need an [facility] hospital follow-up appointment after discharge.  The patient does not have transportation limitations that hinder transportation to clinic appointments.  .Services Needed at time of discharge: Y = Yes, Blank = No PT:   OT:   RN:   Equipment:   Other:     LOS: 4 days   Burgess Estelle, MD 01/08/2015, 8:50 AM

## 2015-01-08 NOTE — Progress Notes (Signed)
Patient ID: [name] [name], female   DOB: 07/11/1975, 39 y.o.   MRN: [id number] 3 Days Post-Op  Subjective: Pt feeling better today.  Tolerating her dressing changes well  Objective: Vital signs in last 24 hours: Temp:  [97.6 F (36.4 C)-98.2 F (36.8 C)] 97.6 F (36.4 C) (08/18 0739) Pulse Rate:  [78-83] 78 (08/18 0739) Resp:  [18] 18 (08/18 0739) BP: (123-150)/(78-87) 150/87 mmHg (08/18 0739) SpO2:  [100 %] 100 % (08/18 0739) Weight:  [147.011 kg (324 lb 1.6 oz)] 147.011 kg (324 lb 1.6 oz) (08/18 0739) Last BM Date: 01/08/15  Intake/Output from previous day: 08/17 0701 - 08/18 0700 In: 1240 [P.O.:1140; IV Piggyback:100] Out: 1300 [Urine:1300] Intake/Output this shift: Total I/O In: 240 [P.O.:240] Out: 1200 [Urine:1200]  PE: Skin: wound is cleaning up.  Currently packed.  Lab Results:   Recent Labs  01/06/15 0550  WBC 13.6*  HGB 9.4*  HCT 28.7*  PLT 223   BMET  Recent Labs  01/06/15 1715 01/07/15 0833  NA 128* 133*  K 4.5 4.1  CL 95* 101  CO2 22 22  GLUCOSE 478* 149*  BUN 36* 38*  CREATININE 1.34* 1.19*  CALCIUM 8.2* 8.6*   PT/INR No results for input(s): LABPROT, INR in the last 72 hours. CMP     Component Value Date/Time   NA 133* 01/07/2015 0833   K 4.1 01/07/2015 0833   CL 101 01/07/2015 0833   CO2 22 01/07/2015 0833   GLUCOSE 149* 01/07/2015 0833   BUN 38* 01/07/2015 0833   CREATININE 1.19* 01/07/2015 0833   CREATININE 0.86 02/03/2014 1017   CALCIUM 8.6* 01/07/2015 0833   PROT 6.7 01/04/2015 1753   ALBUMIN 2.2* 01/04/2015 1753   AST 29 01/04/2015 1753   ALT 23 01/04/2015 1753   ALKPHOS 181* 01/04/2015 1753   BILITOT 1.3* 01/04/2015 1753   GFRNONAA 57* 01/07/2015 0833   GFRNONAA 83 07/23/2013 1604   GFRAA >60 01/07/2015 0833   GFRAA >89 07/23/2013 1604   Lipase  No results found for: LIPASE     Studies/Results: No results found.  Anti-infectives: Anti-infectives    Start     Dose/Rate Route Frequency Ordered Stop   01/08/15 1015  ciprofloxacin (CIPRO) tablet 500 mg     500 mg Oral 2 times daily 01/08/15 0946     01/06/15 1300  piperacillin-tazobactam (ZOSYN) IVPB 3.375 g  Status:  Discontinued     3.375 g 12.5 mL/hr over 240 Minutes Intravenous 3 times per day 01/06/15 1148 01/08/15 0946   01/06/15 1000  doxycycline (VIBRA-TABS) tablet 100 mg     100 mg Oral Every 12 hours 01/06/15 0726     01/05/15 1000  piperacillin-tazobactam (ZOSYN) IVPB 3.375 g  Status:  Discontinued     3.375 g 12.5 mL/hr over 240 Minutes Intravenous Every 8 hours 01/05/15 0358 01/06/15 0922   01/05/15 1000  clindamycin (CLEOCIN) IVPB 600 mg  Status:  Discontinued     600 mg 100 mL/hr over 30 Minutes Intravenous Every 8 hours 01/05/15 0401 01/05/15 1357   01/04/15 2200  piperacillin-tazobactam (ZOSYN) IVPB 3.375 g  Status:  Discontinued     3.375 g 12.5 mL/hr over 240 Minutes Intravenous Every 8 hours 01/04/15 2053 01/05/15 0358   01/04/15 2200  clindamycin (CLEOCIN) IVPB 600 mg  Status:  Discontinued     600 mg 100 mL/hr over 30 Minutes Intravenous Every 8 hours 01/04/15 2100 01/05/15 0401       Assessment/Plan  POD 3, s/p I&D  of buttock abscess -Can be switched to oral doxy for a 7day total course of treatment (cx GNR and GPC) -cont dressing changes BID -HH arranged. -surgically stable for DC when medically stable  -follow up in 2-3 weeks  LOS: 4 days    [name] [name] E 01/08/2015, 10:44 AM Pager: [phone]

## 2015-01-08 NOTE — Discharge Instructions (Addendum)
Please follow up in our clinic next Wednesday at 3:45 PM  PLEASE TAKE THE TWO ANTIBIOTICS AS INSTRUCTED FOR 7 DAYS  Please follow up in the surgery clinic in about 3 weeks (call to make the appointment) at your time. The wound care nurse will come every day and then determine how often they will come  Please take 50 units of lantus Please take 15 units of Novolog with meals  Please take 20 mg of cortef (hydrocortisone) in morning and 10 mg of cortef in the evening  Please take senna as needed for constipation     Dressing Change A dressing is a material placed over wounds. It keeps the wound clean, dry, and protected from further injury. This provides an environment that favors wound healing.  BEFORE YOU BEGIN  Get your supplies together. Things you may need include:  Saline solution.  Flexible gauze dressing.  Medicated cream.  Tape.  Gloves.  Abdominal dressing pads.  Gauze squares.  Plastic bags.  Take pain medicine 30 minutes before the dressing change if you need it.  Take a shower before you do the first dressing change of the day. Use plastic wrap or a plastic bag to prevent the dressing from getting wet. REMOVING YOUR OLD DRESSING   Wash your hands with soap and water. Dry your hands with a clean towel.  Put on your gloves.  Remove any tape.  Carefully remove the old dressing. If the dressing sticks, you may dampen it with warm water to loosen it, or follow your caregiver's specific directions.  Remove any gauze or packing tape that is in your wound.  Take off your gloves.  Put the gloves, tape, gauze, or any packing tape into a plastic bag. CHANGING YOUR DRESSING  Open the supplies.  Take the cap off the saline solution.  Open the gauze package so that the gauze remains on the inside of the package.  Put on your gloves.  Clean your wound as told by your caregiver.  If you have been told to keep your wound dry, follow those  instructions.  Your caregiver may tell you to do one or more of the following:  Pick up the gauze. Pour the saline solution over the gauze. Squeeze out the extra saline solution.  Put medicated cream or other medicine on your wound if you have been told to do so.  Put the solution soaked gauze only in your wound, not on the skin around it.  Pack your wound loosely or as told by your caregiver.  Put dry gauze on your wound.  Put abdominal dressing pads over the dry gauze if your wet gauze soaks through.  Tape the abdominal dressing pads in place so they will not fall off. Do not wrap the tape completely around the affected part (arm, leg, abdomen).  Wrap the dressing pads with a flexible gauze dressing to secure it in place.  Take off your gloves. Put them in the plastic bag with the old dressing. Tie the bag shut and throw it away.  Keep the dressing clean and dry until your next dressing change.  Wash your hands. SEEK MEDICAL CARE IF:  Your skin around the wound looks red.  Your wound feels more tender or sore.  You see pus in the wound.  Your wound smells bad.  You have a fever.  Your skin around the wound has a rash that itches and burns.  You see black or yellow skin in your wound that was  not there before.  You feel nauseous, throw up, and feel very tired. Document Released: 06/16/2004 Document Revised: 08/01/2011 Document Reviewed: 03/21/2011 [facility] Patient Information 2015 [place], [place]. This information is not intended to replace advice given to you by your health care provider. Make sure you discuss any questions you have with your health care provider.

## 2015-01-08 NOTE — Progress Notes (Signed)
RN changed pt dressing.  Pt verbalizing that she is ready to go home.  Explained to pt that discharge orders have been placed and RN would be discharging her after home health RN came to see her.

## 2015-01-08 NOTE — Care Management Note (Signed)
Case Management Note  Patient Details  Name: [name] [name] MRN: [id number] Date of Birth: [birth year]  Subjective/Objective:     Admitted with buttocks abscess               Action/Plan: Talked to patient about DCP; Patient plans to return home at discharge. Patient goes to the Internal Medicine Clinic for medical care and use [facility] pharmacy to obtain her medication. She was active with the MAP Program and plans to renew that soon. HHC choice offered, patient chose [facility] for HHRN/ wound care. Butch Penny with [place] called for arrangements. Patient stated that she cannot afford her medication at discharge, patient qualifies for the [place] ( Medication Assistance Through [facility]), she is approved for a 34 day supply of medication if needed once a year. MATCH form given to patient with explanation of usage.   Expected Discharge Date:  01/09/15               Expected Discharge Plan:  [place]  Discharge planning Services  CM Consult    Choice offered to:  Patient    HH Arranged:  RN [facility] Agency:  [place]  Status of Service:  In process, will continue to follow  Sherrilyn Rist [phone] 01/08/2015, 10:42 AM

## 2015-01-09 ENCOUNTER — Telehealth: Payer: Self-pay | Admitting: Pharmacist

## 2015-01-09 LAB — ACTH

## 2015-01-10 LAB — CULTURE, BLOOD (ROUTINE X 2)
CULTURE: NO GROWTH
Culture: NO GROWTH

## 2015-01-11 LAB — CULTURE, BLOOD (ROUTINE X 2)
CULTURE: NO GROWTH
CULTURE: NO GROWTH

## 2015-01-11 LAB — CULTURE, ROUTINE-ABSCESS: Culture: NO GROWTH

## 2015-01-11 LAB — ANAEROBIC CULTURE

## 2015-01-12 ENCOUNTER — Telehealth: Payer: Self-pay | Admitting: Dietician

## 2015-01-12 NOTE — Telephone Encounter (Addendum)
CDE was asked to follow up with this patient re: Steroid-Induced Hyperglycemia Prevention and Management [name] [name] is a 39 y.o. female  does NOT meet criteria for [facility] quality improvement program (diabetes patient prescribed short courses of oral steroids less than 21 days).  A/P Current Regimen  Patient prescribed hydrocortisone 20 mg in the morning and 10 mg in the evening daily x 30 days, currently on day 4 of therapy. Patient taking hydrocortisone in the AM and PM  Current DM regimen Lantus 50 units and Novolog 15 units AFTER meals  DM regimen prior to steroid course Lantus 23 units daily, novolog 7 units TID and metformin 500 mg twice daily, but had not taken any for the past month  Home BG Monitoring  Patient does check BG at home and does have a meter at home. Meter was not supplied.  CBGs at home : fasting- 154 and 125; dinner 220 and 274, bedtime 74  CBGs prior to steroid course not controlled, A1C prior to steroid course 15.3%  Patient does not report s/sx of hyper- or hypoglycemia: none, none  Medication Management  Switch steroid dose to AM - not applicable  Additional treatment for BG control is not indicated at this time.  Move mealtime insulin to before meals. Medication supply Patient will use own medication   Check blood sugars before lunch  Patient Education  Advised patient to monitor BG while on steroid therapy (3-4x daily before meals and bedtime).  Patient educated about signs/symptoms and advised to contact clinic if hyper- or hypoglycemic.  Patient did verbalize understanding of information and regimen by repeating back topics discussed.  Follow-up tomorrow afternoon 01/14/15 at 3:45 Pm with Dr. Teodora Medici, Butch Penny 4:01 PM 01/12/2015

## 2015-01-14 ENCOUNTER — Ambulatory Visit (INDEPENDENT_AMBULATORY_CARE_PROVIDER_SITE_OTHER): Payer: Self-pay | Admitting: Internal Medicine

## 2015-01-14 VITALS — BP 134/76 | HR 83 | Temp 98.3°F | Wt 338.5 lb

## 2015-01-14 DIAGNOSIS — E113319 Type 2 diabetes mellitus with moderate nonproliferative diabetic retinopathy with macular edema, unspecified eye: Secondary | ICD-10-CM

## 2015-01-14 DIAGNOSIS — F419 Anxiety disorder, unspecified: Secondary | ICD-10-CM

## 2015-01-14 DIAGNOSIS — B9689 Other specified bacterial agents as the cause of diseases classified elsewhere: Secondary | ICD-10-CM

## 2015-01-14 DIAGNOSIS — E1165 Type 2 diabetes mellitus with hyperglycemia: Secondary | ICD-10-CM

## 2015-01-14 DIAGNOSIS — D509 Iron deficiency anemia, unspecified: Secondary | ICD-10-CM

## 2015-01-14 DIAGNOSIS — L0231 Cutaneous abscess of buttock: Secondary | ICD-10-CM

## 2015-01-14 DIAGNOSIS — Z794 Long term (current) use of insulin: Secondary | ICD-10-CM

## 2015-01-14 DIAGNOSIS — F32A Depression, unspecified: Secondary | ICD-10-CM

## 2015-01-14 DIAGNOSIS — F329 Major depressive disorder, single episode, unspecified: Secondary | ICD-10-CM

## 2015-01-14 DIAGNOSIS — Z9889 Other specified postprocedural states: Secondary | ICD-10-CM

## 2015-01-14 DIAGNOSIS — E2740 Unspecified adrenocortical insufficiency: Secondary | ICD-10-CM

## 2015-01-14 DIAGNOSIS — F418 Other specified anxiety disorders: Secondary | ICD-10-CM

## 2015-01-14 DIAGNOSIS — E11331 Type 2 diabetes mellitus with moderate nonproliferative diabetic retinopathy with macular edema: Secondary | ICD-10-CM

## 2015-01-14 DIAGNOSIS — E039 Hypothyroidism, unspecified: Secondary | ICD-10-CM

## 2015-01-14 DIAGNOSIS — I10 Essential (primary) hypertension: Secondary | ICD-10-CM

## 2015-01-14 MED ORDER — CIPROFLOXACIN HCL 500 MG PO TABS: 500.0000 mg | ORAL_TABLET | Freq: Two times a day (BID) | ORAL | Status: AC

## 2015-01-14 MED ORDER — DOXYCYCLINE HYCLATE 100 MG PO TABS: 100.0000 mg | ORAL_TABLET | Freq: Two times a day (BID) | ORAL | Status: AC

## 2015-01-14 MED ORDER — INSULIN GLARGINE 100 UNIT/ML ~~LOC~~ SOLN: 45.0000 [IU] | Freq: Every day | SUBCUTANEOUS | Status: DC

## 2015-01-14 MED ORDER — FLUOXETINE HCL 40 MG PO CAPS: 40.0000 mg | ORAL_CAPSULE | Freq: Every day | ORAL | Status: DC

## 2015-01-14 MED ORDER — OXYCODONE-ACETAMINOPHEN 10-325 MG PO TABS: 1.0000 | ORAL_TABLET | Freq: Four times a day (QID) | ORAL | Status: DC | PRN

## 2015-01-14 NOTE — Patient Instructions (Signed)
-   Decrease Lantus to 45 units. Keep Novolog the same, 15 units three times dail with meals. - Referral placed to endocrinology. Their office should be calling you. - Your course of antibiotics is being extended. Finish the bottles you have and then pick up the new prescriptions. You should be taking antibiotics until your visit with the surgeron. - Increase Prozac to 40 mg daily. I sent a new prescription with the new dose to your pharmacy. - I will contact our social worker about getting someone to help you with dressing changes and psychotherapist services - Please call the clinic if you have any problems  General Instructions:   Please bring your medicines with you each time you come to clinic.  Medicines may include prescription medications, over-the-counter medications, herbal remedies, eye drops, vitamins, or other pills.   Progress Toward Treatment Goals:  Treatment Goal 09/11/2013  Hemoglobin A1C deteriorated  Blood pressure deteriorated  Stop smoking smoking less    Self Care Goals & Plans:  Self Care Goal 07/09/2014  Manage my medications take my medicines as prescribed; bring my medications to every visit; refill my medications on time  Monitor my health keep track of my blood glucose; bring my glucose meter and log to each visit  Eat healthy foods drink diet soda or water instead of juice or soda; eat fruit for snacks and desserts; eat baked foods instead of fried foods  Be physically active take a walk every day  Stop smoking -  Meeting treatment goals -    Home Blood Glucose Monitoring 09/11/2013  Check my blood sugar 4 times a day  When to check my blood sugar before meals     Care Management & Community Referrals:  Referral 09/11/2013  Referrals made to community resources none

## 2015-01-15 ENCOUNTER — Telehealth: Payer: Self-pay | Admitting: Licensed Clinical Social Worker

## 2015-01-15 ENCOUNTER — Encounter: Payer: Self-pay | Admitting: Internal Medicine

## 2015-01-15 LAB — BASIC METABOLIC PANEL
BUN/Creatinine Ratio: 15 (ref 8–20)
BUN: 15 mg/dL (ref 6–20)
CALCIUM: 8.9 mg/dL (ref 8.7–10.2)
CHLORIDE: 102 mmol/L (ref 97–108)
CO2: 25 mmol/L (ref 18–29)
Creatinine, Ser: 0.97 mg/dL (ref 0.57–1.00)
GFR calc Af Amer: 86 mL/min/{1.73_m2} (ref >59)
GFR, EST NON AFRICAN AMERICAN: 74 mL/min/{1.73_m2} (ref >59)
Glucose: 74 mg/dL (ref 65–99)
POTASSIUM: 5.3 mmol/L — AB (ref 3.5–5.2)
Sodium: 141 mmol/L (ref 134–144)

## 2015-01-15 NOTE — Telephone Encounter (Signed)
Unable to reach patient for post-discharge assistance with medication management.

## 2015-01-15 NOTE — Assessment & Plan Note (Signed)
Diagnosed during her hospital admission (8/14-8/18) with two low AM cortisol levels of 5.4 and 1.9. Her ACTH level was < 1.1. Her ACTH level was drawn one day after starting stress dose steroids which could suppress the ACTH level but the inpatient team doubted to this degree. Her TSH level is preserved so pituitary apoplexy is not suspected. She was discharged on hydrocortisone 20 mg in AM and 10 mg in PM. She reports compliance with this regimen.  - Referral to endocrinology - Continue hydrocortisone as above

## 2015-01-15 NOTE — Assessment & Plan Note (Signed)
Hgb 9-10 at baseline. At discharge from hospital on 8/18 her Hgb was 9.4, MCV 78. Last anemia panel in Feb 2016 with iron 36, TIBC 243, and ferritin 96 most consistent with anemia of chronic disease. Likely due to her uncontrolled diabetes. She denies heavy menstrual periods. I do not think she needs iron supplementation as she likely has ACD instead of iron deficiency anemia.

## 2015-01-15 NOTE — Telephone Encounter (Signed)
[name] [name] was referred to CSW to explore available resources.  Pt recently d/c from hospital and was in to [facility] for hospital follow up.  CSW placed called to pt.  CSW left message requesting return call. CSW provided contact hours and phone number.

## 2015-01-15 NOTE — Assessment & Plan Note (Signed)
Patient with recent hospitalization for DKA and right buttock abscess that required I&D in the OR. Wound cultures and blood cultures in the hospital have no growth. She was discharged on Cipro and Doxy which she has been compliant with. Her wound does not appear to be healing well today. There is pus present and the granulation tissue does not appear healthy. The surrounding area is also warm and erythematous. I am concerned as she has difficulty with dressing changes as her aunt can only help her some days and she should be getting daily dressing changes. She is at risk for infection and poor wound healing complications given her uncontrolled diabetes. Our office called over to [facility] to see if we could get an earlier follow up appointment for her, but no appointments available that work with patient's schedule. Will extend antibiotics until patient can see surgery for follow up. - Wound culture taken - Extend antibiotics (Doxy and Cipro) for additional 2 weeks until she sees surgery on Sept 9th - Consult to [place] (social work) to see if we can get home health services to come daily - Refilled Percocet 10-325 mg Q6H PRN #30 tablets for pain control - Patient instructed to return to clinic if she develops fevers, chills, nausea, increased drainage from her wound site

## 2015-01-15 NOTE — Assessment & Plan Note (Signed)
TSH level 4.046 in hospital. Patient is taking synthroid 25 mcg daily.  - Will need repeat TSH level in 3 months

## 2015-01-15 NOTE — Assessment & Plan Note (Signed)
BP Readings from Last 3 Encounters:  01/14/15 134/76  01/08/15 150/87  07/09/14 132/87    Lab Results  Component Value Date   NA 141 01/14/2015   K 5.3* 01/14/2015   CREATININE 0.97 01/14/2015    Assessment: Blood pressure control: controlled Progress toward BP goal:  at goal Comments: BP controlled.   Plan: Medications:  Continue HCTZ 25 mg daily and Olmesartan 20 mg daily

## 2015-01-15 NOTE — Assessment & Plan Note (Addendum)
Lab Results  Component Value Date   HGBA1C 15.3* 01/05/2015   HGBA1C 12.4* 05/20/2014   HGBA1C 8.6 02/03/2014     Assessment: Diabetes control: poor control (HgbA1C >9%) Progress toward A1C goal:  deteriorated Comments: Diabetes severely uncontrolled. Patient with history of skin infections and now with recent hospital admission for DKA and skin abscess. Difficult to assess her blood sugars since she did not bring her glucometer with her today. She does report a few lows this past week with lowest of 51. She notes an overall decrease in her blood sugars since her insulin was increased in the hospital.   Plan: Medications:  Decrease Lantus to 45 units QHS to prevent hypoglycemia. Continue Novolog 15 units TID with meals and Metformin 500 mg daily.  Home glucose monitoring: Frequency: 4 times a day Timing: before meals Instruction/counseling given: reminded to bring blood glucose meter & log to each visit and reminded to bring medications to each visit Other plans:  - Referral to endocrinology as patient has uncontrolled DM, newly diagnosed adrenal insufficiency, and hypothyroidism - Check bmet- had hyperkalemia in hospital>>> K 5.3, high-normal - Encouraged her to test at least 4 times daily if possible - Blood sugar control is going to be key for healing of her buttock wound as well

## 2015-01-15 NOTE — Progress Notes (Signed)
   Subjective:    Patient ID: [name] [name], female    DOB: 1975/11/03, 39 y.o.   MRN: [id number]  HPI Ms. 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   She was hospitalized from 8/14-8/18 for DKA and a right buttock abscess that required I&D in the OR. She was discharged on Cipro and Doxy and recommended to do daily dressing changes. Patient reports she has been taking her antibiotics correctly and has about 5-6 days left. She notes difficulty with dressing changes given the location and her aunt is only able to help her some days. She states the home health people are not able to come every day. She is having to take care of her disabled mother as well. She notes a lot of drainage from the wound to the point that it soaks through the bandage. She reports a lot of associated pain and tries to avoid sitting or putting pressure on her right side. She denies fevers, chills, nausea.   For her diabetes, she did not bring her glucometer with her today. She reports having to use her mother's glucometer. She notes a few low blood sugars the past week, with one 65 and one 51 reading. She notes her blood sugars have been better overall since discharge from the hospital. She has been taking Lantus 50 units daily and Novolog 15 units TID with meals. She is worried about getting low blood sugar. She is aware of when she has hypoglycemia.   Review of Systems General: Denies night sweats, changes in weight, changes in appetite HEENT: Denies headaches, ear pain, changes in vision, rhinorrhea, sore throat CV: Denies CP, palpitations, SOB, orthopnea Pulm: Denies SOB, cough, wheezing GI: Denies abdominal pain, diarrhea, constipation, melena, hematochezia GU: Denies dysuria, hematuria, frequency Msk: Denies muscle cramps, joint pains Neuro: Denies  weakness, numbness, tingling Skin: Reports increased drainage and pain of right buttock wound. Denies rashes, bruising Psych: Reports depression. Denies suicidal ideation and homicidal ideation.     Objective:   Physical Exam General: laying on exam table, depressed mood, appears uncomfortable HEENT: [place]/AT, EOMI, sclera anicteric, mucus membranes moist CV: RRR, no m/g/r Pulm: CTA bilaterally, breaths non-labored Abd: BS+, soft, obese, non-tender Ext: warm, bilateral 1+ pitting edema  Neuro: alert and oriented x 3, no focal deficits Skin: There is a large open wound on the right buttock with unhealthy appearing red granulation tissue with overlying pus in some areas. Surrounding area is tender to palpation, erythematous, and warm to touch.     Assessment & Plan:  Please refer to A&P documentation.

## 2015-01-15 NOTE — Assessment & Plan Note (Signed)
Patient reports feeling depressed due to her recent hospitalization and stresses at home with taking care of her disabled mother on top of her medical problems. She does not feel her Prozac is helping much. She is interested in talking with a therapist as she has done this before and found it beneficial.  - Increase Prozac to 40 mg daily - Continue Buspar 10 mg BID - Referral to SW Edwena Blow) to offer psych resources, possibly [employer]

## 2015-01-16 NOTE — Telephone Encounter (Signed)
CSW returned call to pt.  CSW left message requesting return call. CSW provided contact hours and phone number.

## 2015-01-16 NOTE — Progress Notes (Signed)
Internal Medicine Clinic Attending  I saw and evaluated the patient.  I personally confirmed the key portions of the history and exam documented by Dr. Arcelia Jew and I reviewed pertinent patient test results.  The assessment, diagnosis, and plan were formulated together and I agree with the documentation in the resident's note.

## 2015-01-16 NOTE — Telephone Encounter (Signed)
CSW received call from [facility] SW, [place].  [facility] SW ws currently at pt's home and was made aware this worker was attempting to contact patient.  Discussed both pt's need to re-establish with MAP and schedule appointment with [facility] Financial counselor.  This worker stressed the importance as pt is in need of referral to endocrinology and will need to schedule with financial counselor for access to resources.  Pt received medications from [place], but this is not an ongoing option.  Pt will need to establish with MAP, unless informed otherwise from [facility] pharmacist.  [place] and [facility] SW discussed potential of applying for Medicaid based on medical debt.  [facility] SW will contact [facility] Financial counseling to discuss options available.  Ms. Hassey has an outstanding disability application and has representation. Mental Health: Provided [facility] SW with information to [place] and [facility] [place] would be Ms. Lantz best option as she would not have a copayment.  [facility] [place] may have a $20 fee for assessment.  Pt made aware of walk-in hours available.  CSW will sign off.

## 2015-01-18 LAB — WOUND CULTURE

## 2015-01-23 ENCOUNTER — Telehealth: Payer: Self-pay | Admitting: Licensed Clinical Social Worker

## 2015-01-23 NOTE — Telephone Encounter (Signed)
CSW placed call to [name] [name] to follow up on referral to Endocrinology.  Pt states she has yet to meet with Financial counselor as she does not have the information need to complete her application.  Pt has plans to obtain her photo ID and IRS statement next week.  [name] [name] states she is unable to pay out of pocket for referrals to specialist or mental health at this time and prefers to wait until she obtains her [facility] card.  Pt denies add'l social work needs at this time.  Pt aware CSW is available to assist as needed.

## 2015-06-03 ENCOUNTER — Telehealth: Payer: Self-pay | Admitting: Dietician

## 2015-06-03 ENCOUNTER — Telehealth: Payer: Self-pay | Admitting: Pulmonary Disease

## 2015-06-03 NOTE — Telephone Encounter (Signed)
Called patient to remind her she is due to see her doctor. Also to see if she is planning to follow up here. She says she is still trying to get the papers together to obtain the orange card. She knows how to contact our Development worker, community and plans to do so in the next two weeks. CDE also gave her the phone number to community health & wellness primary care office if needed and encouraged her to follow up with some one soon. [name] [name]

## 2015-06-23 ENCOUNTER — Ambulatory Visit: Payer: Self-pay | Admitting: Pulmonary Disease

## 2015-07-09 ENCOUNTER — Encounter: Payer: Self-pay | Admitting: Pulmonary Disease

## 2015-08-13 ENCOUNTER — Encounter: Payer: Self-pay | Admitting: Pulmonary Disease

## 2015-08-13 ENCOUNTER — Ambulatory Visit (INDEPENDENT_AMBULATORY_CARE_PROVIDER_SITE_OTHER): Payer: Self-pay | Admitting: Pulmonary Disease

## 2015-08-13 VITALS — BP 109/71 | HR 87 | Temp 97.7°F | Ht 70.0 in | Wt 308.4 lb

## 2015-08-13 DIAGNOSIS — E1142 Type 2 diabetes mellitus with diabetic polyneuropathy: Secondary | ICD-10-CM

## 2015-08-13 DIAGNOSIS — E039 Hypothyroidism, unspecified: Secondary | ICD-10-CM | POA: Diagnosis not present

## 2015-08-13 DIAGNOSIS — E2740 Unspecified adrenocortical insufficiency: Secondary | ICD-10-CM

## 2015-08-13 DIAGNOSIS — E1165 Type 2 diabetes mellitus with hyperglycemia: Secondary | ICD-10-CM

## 2015-08-13 DIAGNOSIS — R5383 Other fatigue: Secondary | ICD-10-CM

## 2015-08-13 DIAGNOSIS — E785 Hyperlipidemia, unspecified: Secondary | ICD-10-CM | POA: Diagnosis not present

## 2015-08-13 DIAGNOSIS — D509 Iron deficiency anemia, unspecified: Secondary | ICD-10-CM | POA: Diagnosis not present

## 2015-08-13 DIAGNOSIS — Z23 Encounter for immunization: Secondary | ICD-10-CM

## 2015-08-13 DIAGNOSIS — I10 Essential (primary) hypertension: Secondary | ICD-10-CM

## 2015-08-13 DIAGNOSIS — IMO0002 Reserved for concepts with insufficient information to code with codable children: Secondary | ICD-10-CM

## 2015-08-13 DIAGNOSIS — Z794 Long term (current) use of insulin: Secondary | ICD-10-CM

## 2015-08-13 DIAGNOSIS — M791 Myalgia: Secondary | ICD-10-CM

## 2015-08-13 DIAGNOSIS — Z79899 Other long term (current) drug therapy: Secondary | ICD-10-CM

## 2015-08-13 LAB — POCT GLYCOSYLATED HEMOGLOBIN (HGB A1C)

## 2015-08-13 LAB — GLUCOSE, CAPILLARY: GLUCOSE-CAPILLARY: 349 mg/dL — AB (ref 65–99)

## 2015-08-13 MED ORDER — INSULIN DETEMIR 100 UNIT/ML ~~LOC~~ SOLN: 40.0000 [IU] | Freq: Every day | SUBCUTANEOUS | Status: DC

## 2015-08-13 MED ORDER — METFORMIN HCL 1000 MG PO TABS: 500.0000 mg | ORAL_TABLET | Freq: Every day | ORAL | Status: DC

## 2015-08-13 NOTE — Progress Notes (Signed)
Subjective:    Patient ID: [name] [name], female    DOB: 1975-07-24, 40 y.o.   MRN: [id number]  HPI [name] is a 40 year old woman with hypertension, HLD, obesity, OSA, hypothyroidism, T2DM, GERD, Anxiety/Depression presenting for follow up of DM2.  Has been off of all her medications for about a year. No over the counter medications or herbal supplements. Did not see an endocrinologist or [place].  She has has mid back pain with stabbing sensation. Intermittent. Has had it for over a year. It has not changed over this time. Occasionally takes aspirin.  She has fatigue. She has stabbing, cramping, burning pain in her extremities. Her weight fluctuates +/- 16 lb. She has myalgias. Has had dizziness episodes - not associated with standing up.  Review of Systems Constitutional: no fevers/chills Eyes: no vision changes Ears, nose, mouth, throat, and face: no cough Respiratory: no shortness of breath Cardiovascular: no chest pain Gastrointestinal: no nausea/vomiting, no abdominal pain, no constipation, no diarrhea Genitourinary: no dysuria Integument: no rash  Past Medical History  Diagnosis Date  . Hyperlipidemia   . Hypertension   . Abscess     history of multiple abscesses  . Obesity   . Peripheral neuropathy ([place])   . Abnormal Pap smear of cervix 2009  . Endocarditis 2002    subacute bacterial endocarditis.   . Edema of lower extremity   . Family history of anesthesia complication     "my mom has a hard time coming out from under"  . Heart murmur   . Chronic bronchitis ([place])     "get it q yr" (05/13/2013)  . OSA on CPAP   . Hypothyroidism   . Type II diabetes mellitus ([place])   . Anemia of chronic disease 2002  . History of blood transfusion     "just low blood count" (05/13/2013)  . GERD (gastroesophageal reflux disease)   . Chronic pain   . Anxiety   . Depression   . Bipolar disorder ([place])   . CHF (congestive heart failure) ([place])     pt  denies this hx on 05/13/2013  . Hypothyroidism, adult 03/21/2014  . Cellulitis 05/21/2014    right eye    Current Outpatient Prescriptions on File Prior to Visit  Medication Sig Dispense Refill  . Blood Glucose Monitoring Suppl (BLOOD GLUCOSE METER) kit Check blood sugar up to 3 times a day dx code 250.02 insulin requiring 1 each 0  . busPIRone (BUSPAR) 10 MG tablet Take 1 tablet (10 mg total) by mouth 2 (two) times daily. 60 tablet 0  . FLUoxetine (PROZAC) 40 MG capsule Take 1 capsule (40 mg total) by mouth daily. 30 capsule 3  . gabapentin (NEURONTIN) 300 MG capsule Take 300 mg in the morning and lunch and 600 mg at night (Patient taking differently: Take 300-600 mg by mouth 3 (three) times daily. Take 300 mg in the morning and lunch and 600 mg at night) 120 capsule 1  . glucose blood (WAVESENSE PRESTO) test strip Check blood sugar up to 3 times a day dx code 250.02 insulin requiring 100 each 12  . hydrochlorothiazide (HYDRODIURIL) 25 MG tablet Take 1 tablet (25 mg total) by mouth daily. 30 tablet 1  . hydrocortisone (CORTEF) 10 MG tablet Take 1 tablet (10 mg total) by mouth every evening. 30 tablet 0  . hydrocortisone (CORTEF) 20 MG tablet Take 1 tablet (20 mg total) by mouth every morning. 30 tablet 0  . insulin aspart (NOVOLOG) 100  UNIT/ML injection Inject 15 Units into the skin 3 (three) times daily with meals. 10 mL 12  . insulin glargine (LANTUS) 100 UNIT/ML injection Inject 0.45 mLs (45 Units total) into the skin at bedtime. 10 mL 12  . levothyroxine (SYNTHROID, LEVOTHROID) 25 MCG tablet Take 1 tablet (25 mcg total) by mouth daily before breakfast. 30 tablet 5  . metFORMIN (GLUCOPHAGE) 1000 MG tablet Take 0.5 tablets (500 mg total) by mouth daily with breakfast. 60 tablet 3  . olmesartan (BENICAR) 20 MG tablet Take 1 tablet (20 mg total) by mouth daily. 30 tablet 12  . oxyCODONE-acetaminophen (PERCOCET) 10-325 MG per tablet Take 1 tablet by mouth every 6 (six) hours as needed for pain.  30 tablet 0  . polyethylene glycol (MIRALAX / GLYCOLAX) packet Take 17 g by mouth daily as needed for mild constipation (constipation). 14 each 0  . rosuvastatin (CRESTOR) 10 MG tablet Take 1 tablet (10 mg total) by mouth at bedtime. 30 tablet 11  . senna (SENOKOT) 8.6 MG TABS tablet Take 2 tablets (17.2 mg total) by mouth daily. 120 each 0   No current facility-administered medications on file prior to visit.   Objective:   Physical Exam Blood pressure 109/71, pulse 87, temperature 97.7 F (36.5 C), temperature source Oral, height '5\' 10"'  (1.778 m), weight 308 lb 6.4 oz (139.889 kg), SpO2 100 %. General Apperance: NAD HEENT: Normocephalic, atraumatic, anicteric sclera Neck: Supple, trachea midline Lungs: Clear to auscultation bilaterally. No wheezes, rhonchi or rales. Breathing comfortably Heart: Regular rate and rhythm, no murmur/rub/gallop Abdomen: Soft, nontender, nondistended, no rebound/guarding Extremities: Warm and well perfused, no edema Skin: No rashes or lesions Neurologic: Alert and interactive. No gross deficits.    Assessment & Plan:  Essential hypertension BP Readings from Last 3 Encounters:  08/13/15 109/71  01/14/15 134/76  01/08/15 150/87    Lab Results  Component Value Date   NA 141 01/14/2015   K 5.3* 01/14/2015   CREATININE 0.97 01/14/2015    Assessment: Blood pressure control: controlled Progress toward BP goal:  at goal  Plan: Medications:  Discontinue HCTZ and olmesartan. Other plans:  -Recheck BMP today   Uncontrolled type 2 diabetes mellitus ([place]) Lab Results  Component Value Date   HGBA1C >14.0 08/13/2015   HGBA1C 15.3* 01/05/2015   HGBA1C 12.4* 05/20/2014     Assessment: Diabetes control: poor control (HgbA1C >9%) Progress toward A1C goal:  deteriorated  Plan: Medications:   -Restart metformin 577m with breakfast daily and autotitrate to goal of 1005mBID with meals. -Restart Levemir 40u daily. Autotitrate 1 unit daily until  fasting blood sugar is <150. Other plans:  -Instructed patient to call if she has problems with hypoglycemia -Will need prescription for supplies and meter when her medicaid is established -Follow up in 4-6 weeks.  Diabetic peripheral neuropathy Needs better diabetes control. Will work on this for now.  Hypothyroidism Recheck TSH and free T4. Would start at 15054mdaily based on her weight.  Adrenal insufficiency Assessment: Low normal BP. She has complaints of fatigue and myalgias. Plan: -Assess ACTH level since she has been off of steroids for over a year. -Will likely restart hydrocortisone 61m24mM and 10mg36m. -MRI head if central etiology suspected -Will need referral to endocrinology  Hyperlipidemia Recheck lipid panel and CMP today.   Microcytic anemia Recheck CBC today.   JenniJacques Earthly Internal Medicine Teaching Service PGY-2 08/13/2015 4:48 PM

## 2015-08-13 NOTE — Assessment & Plan Note (Signed)
BP Readings from Last 3 Encounters:  08/13/15 109/71  01/14/15 134/76  01/08/15 150/87    Lab Results  Component Value Date   NA 141 01/14/2015   K 5.3* 01/14/2015   CREATININE 0.97 01/14/2015    Assessment: Blood pressure control: controlled Progress toward BP goal:  at goal  Plan: Medications:  Discontinue HCTZ and olmesartan. Other plans:  -Recheck BMP today

## 2015-08-13 NOTE — Assessment & Plan Note (Signed)
Assessment: Low normal BP. She has complaints of fatigue and myalgias. Plan: -Assess ACTH level since she has been off of steroids for over a year. -Will likely restart hydrocortisone 20mg  qAM and 10mg  qPM. -MRI head if central etiology suspected -Will need referral to endocrinology

## 2015-08-13 NOTE — Assessment & Plan Note (Signed)
Needs better diabetes control. Will work on this for now.

## 2015-08-13 NOTE — Assessment & Plan Note (Signed)
Recheck TSH and free T4. Would start at 152mcg daily based on her weight.

## 2015-08-13 NOTE — Patient Instructions (Addendum)
Start taking metformin, half tablet, daily with breakfast. On March 31, start taking metformin, half tablet, daily with breakfast and with supper. On April 7, start taking metformin, one tablet, daily with breakfast and half tablet daily with supper. On April 14, start taking metformin, one tablet, daily with breakfast and with supper.  Start taking Insulin Levemir in the evening at the same time every day. Take 40 units. You may increase by 1 unit every day until two of your three fasting (morning) blood sugars are less than 150. Please call the clinic if you are having low blood sugars.   Type 2 Diabetes Mellitus, Adult HOME CARE  Have your hemoglobin A1c level checked twice a year. The level shows if your diabetes is under control or out of control.  Test your blood sugar level every day as told by your doctor.  Check your ketone levels by testing your pee (urine) when you are sick and as told.  Take your diabetes or insulin medicine as told by your doctor.  Never run out of insulin.  Adjust how much insulin you give yourself based on how many carbs (carbohydrates) you eat. Carbs are in many foods, such as fruits, vegetables, whole grains, and dairy products.  Have a healthy snack between every healthy meal. Have 3 meals and 3 snacks a day.  Lose weight if you are overweight.  Carry a medical alert card or wear your medical alert jewelry.  Carry a 15-gram carb snack with you at all times. Examples include:  Glucose pills, 3 or 4.  Glucose gel, 15-gram tube.  Raisins, 2 tablespoons (24 grams).  Jelly beans, 6.  Animal crackers, 8.  Regular (not diet) pop, 4 ounces (120 milliliters).  Gummy treats, 9.  Notice low blood sugar (hypoglycemia) symptoms, such as:  Shaking (tremors).  Trouble thinking clearly.  Sweating.  Faster heart rate.  Headache.  Dry mouth.  Hunger.  Crabbiness (irritability).  Being worried or tense (anxious).  Restless sleep.  A  change in speech or coordination.  Confusion.  Treat low blood sugar right away. If you are alert and can swallow, follow the 15:15 rule:  Take 15-20 grams of a rapid-acting glucose or carb. This includes glucose gel, glucose pills, or 4 ounces (120 milliliters) of fruit juice, regular pop, or low-fat milk.  Check your blood sugar level 15 minutes after taking the glucose.  Take 15-20 grams more of glucose if the repeat blood sugar level is still 70 mg/dL (milligrams/deciliter) or below.  Eat a meal or snack within 1 hour of the blood sugar levels going back to normal.  Notice early symptoms of high blood sugar, such as:  Being really thirsty or drinking a lot (polydipsia).  Peeing a lot (polyuria).  Do at least 150 minutes of physical activity a week or as told.  Split the 150 minutes of activity up during the week. Do not do 150 minutes of activity in one day.  Perform exercises, such as weight lifting, at least 2 times a week or as told.  Spend no more than 90 minutes at one time inactive.  Adjust your insulin or food intake as needed if you start a new exercise or sport.  Follow your sick-day plan when you are not able to eat or drink as usual.  Do not smoke, chew tobacco, or use electronic cigarettes.  Women who are not pregnant should drink no more than 1 drink a day. Men should drink no more than 2 drinks a  day.  Only drink alcohol with food.  Ask your doctor if alcohol is safe for you.  Tell your doctor if you drink alcohol several times during the week.  See your doctor regularly.  Schedule an eye exam soon after you are told you have diabetes. Schedule exams once every year.  Check your skin and feet every day. Check for cuts, bruises, redness, nail problems, bleeding, blisters, or sores. A doctor should do a foot exam once a year.  Brush your teeth and gums twice a day. Floss once a day. Visit your dentist regularly.  Share your diabetes plan with your  workplace or school.  Keep your shots that fight diseases (vaccines) up to date.  Get a flu (influenza) shot every year.  Get a pneumonia shot. If you are 62 years of age or older and you have never gotten a pneumonia shot, you might need to get two shots.  Ask your doctor which other shots you should get.  Learn how to deal with stress.  Get diabetes education and support as needed.  Ask your doctor for special help if:  You need help to maintain or improve how you do things on your own.  You need help to maintain or improve the quality of your life.  You have foot or hand problems.  You have trouble cleaning yourself, dressing, eating, or doing physical activity. GET HELP IF:  You are unable to eat or drink for more than 6 hours.  You feel sick to your stomach (nauseous) or throw up (vomit) for more than 6 hours.  Your blood sugar level is over 240 mg/dL.  There is a change in mental status.  You get another serious illness.  You have watery poop (diarrhea) for more than 6 hours.  You have been sick or have had a fever for 2 or more days and are not getting better.  You have pain when you are active. GET HELP RIGHT AWAY IF:  You have trouble breathing.  Your ketone levels are higher than your doctor says they should be. MAKE SURE YOU:  Understand these instructions.  Will watch your condition.  Will get help right away if you are not doing well or get worse.   This information is not intended to replace advice given to you by your health care provider. Make sure you discuss any questions you have with your health care provider.   Document Released: 02/16/2008 Document Revised: 09/23/2014 Document Reviewed: 12/09/2011 Elsevier Interactive Patient Education [employer].

## 2015-08-13 NOTE — Assessment & Plan Note (Signed)
Recheck CBC today. 

## 2015-08-13 NOTE — Assessment & Plan Note (Signed)
Lab Results  Component Value Date   HGBA1C >14.0 08/13/2015   HGBA1C 15.3* 01/05/2015   HGBA1C 12.4* 05/20/2014     Assessment: Diabetes control: poor control (HgbA1C >9%) Progress toward A1C goal:  deteriorated  Plan: Medications:   -Restart metformin 500mg  with breakfast daily and autotitrate to goal of 1000mg  BID with meals. -Restart Levemir 40u daily. Autotitrate 1 unit daily until fasting blood sugar is <150. Other plans:  -Instructed patient to call if she has problems with hypoglycemia -Will need prescription for supplies and meter when her medicaid is established -Follow up in 4-6 weeks.

## 2015-08-13 NOTE — Assessment & Plan Note (Signed)
Recheck lipid panel and CMP today.

## 2015-08-14 LAB — CMP14 + ANION GAP
A/G RATIO: 1 — AB (ref 1.2–2.2)
ALK PHOS: 83 IU/L (ref 39–117)
ALT: 9 IU/L (ref 0–32)
AST: 9 IU/L (ref 0–40)
Albumin: 3.4 g/dL — ABNORMAL LOW (ref 3.5–5.5)
Anion Gap: 15 mmol/L (ref 10.0–18.0)
BUN/Creatinine Ratio: 19 (ref 8–20)
BUN: 17 mg/dL (ref 6–20)
Bilirubin Total: <0.2 mg/dL (ref 0.0–1.2)
CALCIUM: 9.3 mg/dL (ref 8.7–10.2)
CHLORIDE: 100 mmol/L (ref 96–106)
CO2: 22 mmol/L (ref 18–29)
Creatinine, Ser: 0.9 mg/dL (ref 0.57–1.00)
GFR calc Af Amer: 93 mL/min/{1.73_m2} (ref >59)
GFR, EST NON AFRICAN AMERICAN: 81 mL/min/{1.73_m2} (ref >59)
GLUCOSE: 271 mg/dL — AB (ref 65–99)
Globulin, Total: 3.3 g/dL (ref 1.5–4.5)
POTASSIUM: 4.8 mmol/L (ref 3.5–5.2)
Sodium: 137 mmol/L (ref 134–144)
Total Protein: 6.7 g/dL (ref 6.0–8.5)

## 2015-08-14 LAB — LIPID PANEL
CHOL/HDL RATIO: 5.8 ratio — AB (ref 0.0–4.4)
CHOLESTEROL TOTAL: 230 mg/dL — AB (ref 100–199)
HDL: 40 mg/dL (ref >39)
LDL Calculated: 156 mg/dL — ABNORMAL HIGH (ref 0–99)
TRIGLYCERIDES: 168 mg/dL — AB (ref 0–149)
VLDL Cholesterol Cal: 34 mg/dL (ref 5–40)

## 2015-08-14 LAB — CBC
HEMATOCRIT: 36.3 % (ref 34.0–46.6)
HEMOGLOBIN: 12.1 g/dL (ref 11.1–15.9)
MCH: 26.2 pg — AB (ref 26.6–33.0)
MCHC: 33.3 g/dL (ref 31.5–35.7)
MCV: 79 fL (ref 79–97)
Platelets: 232 10*3/uL (ref 150–379)
RBC: 4.61 x10E6/uL (ref 3.77–5.28)
RDW: 15.3 % (ref 12.3–15.4)
WBC: 5.9 10*3/uL (ref 3.4–10.8)

## 2015-08-14 LAB — ACTH: ACTH: 18 pg/mL (ref 7.2–63.3)

## 2015-08-14 LAB — TSH: TSH: 2.37 u[IU]/mL (ref 0.450–4.500)

## 2015-08-14 LAB — T4, FREE: FREE T4: 1.09 ng/dL (ref 0.82–1.77)

## 2015-08-14 NOTE — Progress Notes (Signed)
Internal Medicine Clinic Attending  Case discussed with Dr. Krall at the time of the visit.  We reviewed the resident's history and exam and pertinent patient test results.  I agree with the assessment, diagnosis, and plan of care documented in the resident's note.  

## 2015-08-18 ENCOUNTER — Other Ambulatory Visit: Payer: Self-pay | Admitting: Dietician

## 2015-08-18 DIAGNOSIS — E1165 Type 2 diabetes mellitus with hyperglycemia: Principal | ICD-10-CM

## 2015-08-18 DIAGNOSIS — E1142 Type 2 diabetes mellitus with diabetic polyneuropathy: Secondary | ICD-10-CM

## 2015-08-18 DIAGNOSIS — IMO0002 Reserved for concepts with insufficient information to code with codable children: Secondary | ICD-10-CM

## 2015-08-18 NOTE — Telephone Encounter (Signed)
Called patient to find out the lot # and expiration on her vial of levemir. She got her medicaid and wanted to know if she can still use the [employer]. Diabetes educator explained that she could not and that using her medicaid at a retail pharmacy would be less costly overall. [facility]. She also says she has swelling in her legs that she was going to give a day or two and if it doesn't go down call to schedule an appointment earlier.

## 2015-08-20 MED ORDER — "INSULIN SYRINGE-NEEDLE U-100 31G X 15/64"" 0.5 ML MISC": Status: DC

## 2015-08-20 MED ORDER — ACCU-CHEK FASTCLIX LANCETS MISC: Status: DC

## 2015-08-20 MED ORDER — GLUCOSE BLOOD VI STRP: ORAL_STRIP | Status: DC

## 2015-08-20 MED ORDER — ACCU-CHEK AVIVA PLUS W/DEVICE KIT: PACK | Status: DC

## 2015-09-14 ENCOUNTER — Ambulatory Visit: Payer: Self-pay | Admitting: Pulmonary Disease

## 2015-09-14 ENCOUNTER — Encounter: Payer: Self-pay | Admitting: Pulmonary Disease

## 2015-09-14 ENCOUNTER — Ambulatory Visit (INDEPENDENT_AMBULATORY_CARE_PROVIDER_SITE_OTHER): Payer: Medicare Other | Admitting: Pulmonary Disease

## 2015-09-14 VITALS — BP 157/87 | HR 85 | Temp 98.3°F | Wt 314.1 lb

## 2015-09-14 DIAGNOSIS — M2550 Pain in unspecified joint: Secondary | ICD-10-CM

## 2015-09-14 DIAGNOSIS — E039 Hypothyroidism, unspecified: Secondary | ICD-10-CM

## 2015-09-14 DIAGNOSIS — Z8639 Personal history of other endocrine, nutritional and metabolic disease: Secondary | ICD-10-CM

## 2015-09-14 DIAGNOSIS — IMO0002 Reserved for concepts with insufficient information to code with codable children: Secondary | ICD-10-CM

## 2015-09-14 DIAGNOSIS — E2740 Unspecified adrenocortical insufficiency: Secondary | ICD-10-CM

## 2015-09-14 DIAGNOSIS — E1142 Type 2 diabetes mellitus with diabetic polyneuropathy: Secondary | ICD-10-CM | POA: Diagnosis not present

## 2015-09-14 DIAGNOSIS — Z7984 Long term (current) use of oral hypoglycemic drugs: Secondary | ICD-10-CM | POA: Diagnosis not present

## 2015-09-14 DIAGNOSIS — I10 Essential (primary) hypertension: Secondary | ICD-10-CM

## 2015-09-14 DIAGNOSIS — E1165 Type 2 diabetes mellitus with hyperglycemia: Secondary | ICD-10-CM | POA: Diagnosis not present

## 2015-09-14 MED ORDER — INSULIN GLARGINE 100 UNIT/ML ~~LOC~~ SOLN: 40.0000 [IU] | Freq: Every day | SUBCUTANEOUS | Status: DC

## 2015-09-14 MED ORDER — LISINOPRIL-HYDROCHLOROTHIAZIDE 10-12.5 MG PO TABS: 1.0000 | ORAL_TABLET | Freq: Every day | ORAL | Status: DC

## 2015-09-14 MED ORDER — PREGABALIN 50 MG PO CAPS: 50.0000 mg | ORAL_CAPSULE | Freq: Three times a day (TID) | ORAL | Status: DC

## 2015-09-14 NOTE — Patient Instructions (Signed)
Start Lantus at 40 units daily. Increase your dose by 1 unit daily until fasting blood sugar is <180.

## 2015-09-14 NOTE — Progress Notes (Signed)
Subjective:    Patient ID: [name] [name], female    DOB: 1975/07/14, 40 y.o.   MRN: [id number]  HPI [name] [name] is a 40 year old woman with hypertension, HLD, obesity, OSA, hypothyroidism, T2DM, GERD, Anxiety/Depression presenting for follow up of DM2.  Blood sugars. Dropped bottle of Levemir. Last had Levemir 2 weeks. Taking metformin 10108m BID. Did not autotitrate. Forgot to autotitrate.   She has fatigue. She has stabbing, cramping, burning pain in her extremities. Her weight fluctuates +/- 16 lb. She has myalgias.   Review of Systems Constitutional: no fevers/chills Eyes: no vision changes Ears, nose, mouth, throat, and face: no cough Respiratory: no shortness of breath Cardiovascular: no chest pain Gastrointestinal: no nausea/vomiting, no constipation, no diarrhea Genitourinary: no dysuria Integument: no rash  Past Medical History  Diagnosis Date  . Hyperlipidemia   . Hypertension   . Abscess     history of multiple abscesses  . Obesity   . Peripheral neuropathy ([place]   . Abnormal Pap smear of cervix 2009  . Endocarditis 2002    subacute bacterial endocarditis.   . Edema of lower extremity   . Family history of anesthesia complication     "my mom has a hard time coming out from under"  . Heart murmur   . Chronic bronchitis ([place]     "get it q yr" (05/13/2013)  . OSA on CPAP   . Hypothyroidism   . Type II diabetes mellitus ([place]   . Anemia of chronic disease 2002  . History of blood transfusion     "just low blood count" (05/13/2013)  . GERD (gastroesophageal reflux disease)   . Chronic pain   . Anxiety   . Depression   . Bipolar disorder ([place]   . CHF (congestive heart failure) ([place]     pt denies this hx on 05/13/2013  . Hypothyroidism, adult 03/21/2014  . Cellulitis 05/21/2014    right eye    Current Outpatient Prescriptions on File Prior to Visit  Medication Sig Dispense Refill  . ACCU-CHEK FASTCLIX LANCETS MISC Check blood sugar  3 times a day before meals 102 each 12  . Blood Glucose Monitoring Suppl (ACCU-CHEK AVIVA PLUS) w/Device KIT Check blood sugar 3 times a day before meals 1 kit 1  . glucose blood (ACCU-CHEK AVIVA PLUS) test strip Check blood sugar 3 times a day before meals 100 each 12  . insulin detemir (LEVEMIR) 100 UNIT/ML injection Inject 0.4 mLs (40 Units total) into the skin at bedtime. 10 mL 0  . Insulin Syringe-Needle U-100 31G X 15/64" 0.5 ML MISC Use to inject insulin as isntructed 100 each 6  . metFORMIN (GLUCOPHAGE) 1000 MG tablet Take 0.5 tablets (500 mg total) by mouth daily with breakfast. 60 tablet 0   No current facility-administered medications on file prior to visit.   Objective:   Physical Exam  Blood pressure 157/87, pulse 85, temperature 98.3 F (36.8 C), temperature source Oral, weight 314 lb 1.6 oz (142.475 kg), SpO2 100 %.   General Apperance: NAD HEENT: Normocephalic, atraumatic, anicteric sclera Neck: Supple, trachea midline Lungs: Clear to auscultation bilaterally. No wheezes, rhonchi or rales. Breathing comfortably Heart: Regular rate and rhythm, no murmur/rub/gallop Abdomen: Soft, nontender, nondistended, no rebound/guarding Extremities: Warm and well perfused, no edema Skin: No rashes or lesions Neurologic: Alert and interactive. No gross deficits.    Assessment & Plan:  Essential hypertension Assessment: BP 157/87 today  Plan:  Start lisinopril-HCTZ 10-12.5 mg daily. Follow up in 6  weeks.  Adrenal insufficiency Previously had adrenal insufficiency. She did not like the steroids and took herself off of them. We had tried to refer her to endocrinology in the past but due to insurance issues, was not able to go. She has insurance now.   Diagnosed during her hospital admission (8/14-8/18) with two low AM cortisol levels of 5.4 and 1.9. Her ACTH level was < 1.1 (Drawn one day after starting stress dose steroids)   Referral to endocrinology.  Uncontrolled type 2  diabetes mellitus ([place]) She is taking metformin but did not continue insulin due to breaking her vial. No hypoglycemia.  -Continue metformin 1039m BID with meals. -Restart Lantus 40u daily. Autotitrate 1 unit daily until fasting blood sugar is <180. -Instructed patient to call if she has problems with hypoglycemia -Follow up in 4-6 weeks.  Hypothyroidism TSH and free T4 within normal limits. Does not need to be on Synthroid.  Diabetic peripheral neuropathy Neuropathy pain but also has joint pains in her MCP and PIP joints. The joint pains worst in mornings and improve throughout day. Concerning for inflammatory joint etiology. ESR and CRP mildly elevated but down from previous measurements. ANA, RA unremarkable.  Lyrica daily Continue to monitor for now   JJacques Earthly MD  Internal Medicine Teaching Service PGY-2 09/15/2015 4:18 PM

## 2015-09-15 ENCOUNTER — Telehealth: Payer: Self-pay

## 2015-09-15 LAB — GLUCOSE, CAPILLARY: Glucose-Capillary: 451 mg/dL — ABNORMAL HIGH (ref 65–99)

## 2015-09-15 NOTE — Assessment & Plan Note (Signed)
She is taking metformin but did not continue insulin due to breaking her vial. No hypoglycemia.  -Continue metformin 1000mg  BID with meals. -Restart Lantus 40u daily. Autotitrate 1 unit daily until fasting blood sugar is <180. -Instructed patient to call if she has problems with hypoglycemia -Follow up in 4-6 weeks.

## 2015-09-15 NOTE — Assessment & Plan Note (Signed)
TSH and free T4 within normal limits. Does not need to be on Synthroid.

## 2015-09-15 NOTE — Telephone Encounter (Signed)
Please call pt back.

## 2015-09-15 NOTE — Telephone Encounter (Signed)
Pt calls and states [facility] told her that she could have the doctor send a script to them for victoza and they would make sure she got it, do you still want to order this? If so send electronically please

## 2015-09-15 NOTE — Assessment & Plan Note (Signed)
Previously had adrenal insufficiency. She did not like the steroids and took herself off of them. We had tried to refer her to endocrinology in the past but due to insurance issues, was not able to go. She has insurance now.   Diagnosed during her hospital admission (8/14-8/18) with two low AM cortisol levels of 5.4 and 1.9. Her ACTH level was < 1.1 (Drawn one day after starting stress dose steroids)   Referral to endocrinology.

## 2015-09-15 NOTE — Assessment & Plan Note (Signed)
Assessment: BP 157/87 today  Plan:  Start lisinopril-HCTZ 10-12.5 mg daily. Follow up in 6 weeks.

## 2015-09-15 NOTE — Assessment & Plan Note (Signed)
Neuropathy pain but also has joint pains in her MCP and PIP joints. The joint pains worst in mornings and improve throughout day. Concerning for inflammatory joint etiology. ESR and CRP mildly elevated but down from previous measurements. ANA, RA unremarkable.  Lyrica daily Continue to monitor for now

## 2015-09-16 LAB — SEDIMENTATION RATE: Sed Rate: 53 mm/hr — ABNORMAL HIGH (ref 0–32)

## 2015-09-16 LAB — RHEUMATOID FACTOR

## 2015-09-16 LAB — C-REACTIVE PROTEIN: CRP: 5.4 mg/L — AB (ref 0.0–4.9)

## 2015-09-16 LAB — CYCLIC CITRUL PEPTIDE ANTIBODY, IGG/IGA: Cyclic Citrullin Peptide Ab: <1 units (ref 0–19)

## 2015-09-16 LAB — ANTINUCLEAR ANTIBODIES, IFA: ANA TITER 1: NEGATIVE

## 2015-09-16 NOTE — Telephone Encounter (Signed)
Called pt, lm for rtc 

## 2015-09-16 NOTE — Telephone Encounter (Signed)
She is to start on the insulin for now and will consider Victoza at next appointment. Thanks!

## 2015-09-16 NOTE — Telephone Encounter (Signed)
Pt wanted lyrica  Faxed to pharm, she has paper script, she states the pharm prefers it, i informed her that she would need to return the paper script at her next visit, spoke to pharm she will turn paper over to pharm when they deliver med

## 2015-09-17 NOTE — Progress Notes (Signed)
Internal Medicine Clinic Attending  Case discussed with Dr. Krall at the time of the visit.  We reviewed the resident's history and exam and pertinent patient test results.  I agree with the assessment, diagnosis, and plan of care documented in the resident's note.  

## 2015-10-08 ENCOUNTER — Encounter: Payer: Self-pay | Admitting: *Deleted

## 2015-10-15 ENCOUNTER — Other Ambulatory Visit: Payer: Self-pay | Admitting: Pulmonary Disease

## 2015-10-29 ENCOUNTER — Encounter: Payer: Medicare Other | Admitting: Pulmonary Disease

## 2015-10-29 ENCOUNTER — Encounter: Payer: Self-pay | Admitting: Pulmonary Disease

## 2015-10-30 ENCOUNTER — Telehealth: Payer: Self-pay | Admitting: *Deleted

## 2015-10-30 NOTE — Telephone Encounter (Signed)
Request from [facility] for an order for the Accu Check Aviva Plus glucometer and compatible lancets. Notified Dr. Randell Patient who authorized a verbal order over the phone. VO given to [place] for glucometer and compatible lancets.

## 2015-11-05 NOTE — Addendum Note (Signed)
Addended by: Yvonna Alanis E on: 11/05/2015 01:01 PM   Modules accepted: Orders

## 2015-11-12 ENCOUNTER — Other Ambulatory Visit: Payer: Self-pay | Admitting: Pulmonary Disease

## 2015-11-16 NOTE — Telephone Encounter (Signed)
Called to pharmacy 

## 2015-11-26 ENCOUNTER — Ambulatory Visit (INDEPENDENT_AMBULATORY_CARE_PROVIDER_SITE_OTHER): Payer: Medicare Other | Admitting: Pulmonary Disease

## 2015-11-26 ENCOUNTER — Encounter: Payer: Self-pay | Admitting: Pulmonary Disease

## 2015-11-26 VITALS — BP 115/69 | HR 86 | Temp 97.8°F | Ht 70.0 in | Wt 302.7 lb

## 2015-11-26 DIAGNOSIS — IMO0002 Reserved for concepts with insufficient information to code with codable children: Secondary | ICD-10-CM

## 2015-11-26 DIAGNOSIS — I10 Essential (primary) hypertension: Secondary | ICD-10-CM | POA: Diagnosis not present

## 2015-11-26 DIAGNOSIS — Z6841 Body Mass Index (BMI) 40.0 and over, adult: Secondary | ICD-10-CM | POA: Diagnosis not present

## 2015-11-26 DIAGNOSIS — E1165 Type 2 diabetes mellitus with hyperglycemia: Secondary | ICD-10-CM

## 2015-11-26 DIAGNOSIS — Z794 Long term (current) use of insulin: Secondary | ICD-10-CM | POA: Diagnosis not present

## 2015-11-26 DIAGNOSIS — E2740 Unspecified adrenocortical insufficiency: Secondary | ICD-10-CM | POA: Diagnosis not present

## 2015-11-26 DIAGNOSIS — E1142 Type 2 diabetes mellitus with diabetic polyneuropathy: Secondary | ICD-10-CM

## 2015-11-26 DIAGNOSIS — R42 Dizziness and giddiness: Secondary | ICD-10-CM | POA: Diagnosis not present

## 2015-11-26 DIAGNOSIS — L84 Corns and callosities: Secondary | ICD-10-CM | POA: Diagnosis not present

## 2015-11-26 LAB — POCT GLYCOSYLATED HEMOGLOBIN (HGB A1C)

## 2015-11-26 LAB — GLUCOSE, CAPILLARY: GLUCOSE-CAPILLARY: 373 mg/dL — AB (ref 65–99)

## 2015-11-26 MED ORDER — CYCLOBENZAPRINE HCL 5 MG PO TABS: 5.0000 mg | ORAL_TABLET | Freq: Three times a day (TID) | ORAL | Status: DC | PRN

## 2015-11-26 MED ORDER — METFORMIN HCL ER 500 MG PO TB24: 500.0000 mg | ORAL_TABLET | Freq: Every day | ORAL | Status: DC

## 2015-11-26 MED ORDER — INSULIN GLARGINE 100 UNIT/ML ~~LOC~~ SOLN: 44.0000 [IU] | Freq: Every day | SUBCUTANEOUS | Status: DC

## 2015-11-26 NOTE — Patient Instructions (Signed)
Start taking metformin extended release 1 tablet daily with breakfast. After 1 week, increase to TWO tablets daily with breakfast. Increase your Lantus to 44 units daily. You may increase your dose by 2 units every 3 days until your fasting blood glucose is less than 150.

## 2015-11-26 NOTE — Progress Notes (Signed)
   CC: BLE pain  HPI:  [name] is a 40 year old woman with hypertension, HLD, obesity, OSA, hypothyroidism, T2DM, GERD, Anxiety/Depression presenting for follow up of DM2.  Still having pain. Bilateral legs has cramping pain. Hands hurt. Neck and downwards, she has pain.  Had syncope early May/end of April. Was standing in kitchen at the time. Had walked from the living room into the kitchen. Has had episodes of dizziness since then but no episodes of syncope. Episode lasted less than 5 minutes. Numbing, warming sensation to her body. No darkening or room spinning. Does not check her blood sugar during these episodes. Can have these episodes at rest/sitting.  Has appointment with endocrinology later this month.   +fatigue. Appetite ok. No N/V/abdominal pain. No salt cravings. Denies depression presently.   Past Medical History  Diagnosis Date  . Hyperlipidemia   . Hypertension   . Abscess     history of multiple abscesses  . Obesity   . Peripheral neuropathy ([place])   . Abnormal Pap smear of cervix 2009  . Endocarditis 2002    subacute bacterial endocarditis.   . Edema of lower extremity   . Family history of anesthesia complication     "my mom has a hard time coming out from under"  . Heart murmur   . Chronic bronchitis ([place])     "get it q yr" (05/13/2013)  . OSA on CPAP   . Hypothyroidism   . Type II diabetes mellitus ([place])   . Anemia of chronic disease 2002  . History of blood transfusion     "just low blood count" (05/13/2013)  . GERD (gastroesophageal reflux disease)   . Chronic pain   . Anxiety   . Depression   . Bipolar disorder ([place])   . CHF (congestive heart failure) ([place])     pt denies this hx on 05/13/2013  . Hypothyroidism, adult 03/21/2014  . Cellulitis 05/21/2014    right eye    Review of Systems:   Constitutional: no fevers/chills Respiratory: no shortness of breath Cardiovascular: no chest pain  Physical Exam:  Filed Vitals:   11/26/15 1513 11/26/15 1515  BP:  115/69  Pulse:  86  Temp: 97.8 F (36.6 C)   TempSrc: Oral   Height: 5\' 10"  (1.778 m)   Weight: 302 lb 11.2 oz (137.304 kg)   SpO2:  98%   General Apperance: NAD HEENT: Normocephalic, atraumatic, anicteric sclera Neck: Supple, trachea midline Lungs: Clear to auscultation bilaterally. No wheezes, rhonchi or rales. Breathing comfortably Heart: Regular rate and rhythm, no murmur/rub/gallop Abdomen: Soft, nontender, nondistended, no rebound/guarding Extremities: Warm and well perfused, 1+ BLE edema Skin: No rashes or lesions Neurologic: Alert and interactive. No gross deficits.   Assessment & Plan:   See encounters tab for problem based medical decision making.   Patient discussed with Dr. Dareen Piano

## 2015-11-26 NOTE — Assessment & Plan Note (Addendum)
Assessment: BP 115/69  Plan:  Discontinuing lisinopril-HCTZ 10-12.5 mg daily Check BMP

## 2015-11-27 LAB — BMP8+ANION GAP
ANION GAP: 18 mmol/L (ref 10.0–18.0)
BUN/Creatinine Ratio: 26 — ABNORMAL HIGH (ref 9–23)
BUN: 27 mg/dL — ABNORMAL HIGH (ref 6–20)
CALCIUM: 9.5 mg/dL (ref 8.7–10.2)
CHLORIDE: 96 mmol/L (ref 96–106)
CO2: 21 mmol/L (ref 18–29)
Creatinine, Ser: 1.05 mg/dL — ABNORMAL HIGH (ref 0.57–1.00)
GFR calc non Af Amer: 67 mL/min/{1.73_m2} (ref >59)
GFR, EST AFRICAN AMERICAN: 77 mL/min/{1.73_m2} (ref >59)
GLUCOSE: 370 mg/dL — AB (ref 65–99)
POTASSIUM: 4.5 mmol/L (ref 3.5–5.2)
Sodium: 135 mmol/L (ref 134–144)

## 2015-11-27 NOTE — Assessment & Plan Note (Signed)
Poorly controlled diabetic with calluses on bilateral feet.  Referral to podiatry for foot care.

## 2015-11-27 NOTE — Assessment & Plan Note (Addendum)
Episodes of "dizziness" and body aches may be related to her adrenal insufficiency. Had two low AM cortisol levels of 5.4 and 1.9 during her hospitalization 8/14 to 8/18. ACTH level <1.1. She has not continued steroids due to edema. Orthostatic BP today decreased by 17 units from supine to standing.   BMP Latest Ref Rng 11/26/2015 08/13/2015 01/14/2015  Glucose 65 - 99 mg/dL 370(H) 271(H) 74  BUN 6 - 20 mg/dL 27(H) 17 15  Creatinine 0.57 - 1.00 mg/dL 1.05(H) 0.90 0.97  BUN/Creat Ratio 9 - 23 26(H) 19 15  Sodium 134 - 144 mmol/L 135 137 141  Potassium 3.5 - 5.2 mmol/L 4.5 4.8 5.3(H)  Chloride 96 - 106 mmol/L 96 100 102  CO2 18 - 29 mmol/L 21 22 25   Calcium 8.7 - 10.2 mg/dL 9.5 9.3 8.9   Discontinue antihypertensive medication for now. May consider low dose lisinopril in the future. Short term flexeril 5mg  TID prn for LE cramps Follow up with endocrinology is later this month.

## 2015-11-27 NOTE — Assessment & Plan Note (Signed)
She is interested in bariatric surgery. She would qualify based on BMI.  Referral to general surgery.

## 2015-11-27 NOTE — Assessment & Plan Note (Signed)
Assessment: Remains uncontrolled with Hgb A1c >14. She did not follow instructions to titrate her Lantus. She has stopped taking her metformin as well.  Plan: -Restart metformin XR 500mg  daily and titrate to 1000mg  daily in 1 week -Increase Lantus to 44 units daily. Given instructions to self titrate Lantus 2 units every 3 days until fasting blood glucose is less than 150 -Asked patient to check her blood glucose during her "dizzy" episodes -Follow up in 2 weeks if unable to self titrate. Follow up in 4 weeks if able to self titrate.

## 2015-11-30 NOTE — Progress Notes (Signed)
Internal Medicine Clinic Attending  Case discussed with Dr. Krall at the time of the visit.  We reviewed the resident's history and exam and pertinent patient test results.  I agree with the assessment, diagnosis, and plan of care documented in the resident's note.  

## 2015-12-07 ENCOUNTER — Ambulatory Visit: Payer: Medicare Other | Admitting: Endocrinology

## 2015-12-09 ENCOUNTER — Encounter: Payer: Self-pay | Admitting: *Deleted

## 2015-12-14 ENCOUNTER — Other Ambulatory Visit: Payer: Self-pay | Admitting: Pulmonary Disease

## 2015-12-23 ENCOUNTER — Telehealth: Payer: Self-pay | Admitting: Pulmonary Disease

## 2015-12-23 NOTE — Telephone Encounter (Signed)
APT. REMINDER CALL, LMTCB °

## 2015-12-24 ENCOUNTER — Encounter: Payer: Self-pay | Admitting: Pulmonary Disease

## 2015-12-24 ENCOUNTER — Ambulatory Visit (INDEPENDENT_AMBULATORY_CARE_PROVIDER_SITE_OTHER): Payer: Medicare Other | Admitting: Pulmonary Disease

## 2015-12-24 DIAGNOSIS — Z9114 Patient's other noncompliance with medication regimen: Secondary | ICD-10-CM

## 2015-12-24 DIAGNOSIS — F17210 Nicotine dependence, cigarettes, uncomplicated: Secondary | ICD-10-CM

## 2015-12-24 DIAGNOSIS — E1165 Type 2 diabetes mellitus with hyperglycemia: Principal | ICD-10-CM

## 2015-12-24 DIAGNOSIS — E1142 Type 2 diabetes mellitus with diabetic polyneuropathy: Secondary | ICD-10-CM | POA: Diagnosis present

## 2015-12-24 DIAGNOSIS — IMO0002 Reserved for concepts with insufficient information to code with codable children: Secondary | ICD-10-CM

## 2015-12-24 LAB — GLUCOSE, CAPILLARY: Glucose-Capillary: 269 mg/dL — ABNORMAL HIGH (ref 65–99)

## 2015-12-24 NOTE — Progress Notes (Signed)
   CC: leg pain  HPI:  [name] is a 40 year old woman with hypertension, HLD, obesity, OSA, hypothyroidism, T2DM, GERD, Anxiety/Depression presenting for follow up of DM2.   Flexeril did not help her pain. She has not seen endocrinology. She has an appointment later this morning. Does not feel that Lyrica is helping either.   Takes metformin and Lantus 43units daily. Was not taking it before the last week because she could not check your blood sugars. Does not want to increase her dosage of metformin.   Has not been taking her blood pressure medication as instructed.   Past Medical History:  Diagnosis Date  . Abnormal Pap smear of cervix 2009  . Abscess    history of multiple abscesses  . Anemia of chronic disease 2002  . Anxiety   . Bipolar disorder ([place])   . Cellulitis 05/21/2014   right eye  . CHF (congestive heart failure) ([place])    pt denies this hx on 05/13/2013  . Chronic bronchitis ([place])    "get it q yr" (05/13/2013)  . Chronic pain   . Depression   . Edema of lower extremity   . Endocarditis 2002   subacute bacterial endocarditis.   . Family history of anesthesia complication    "my mom has a hard time coming out from under"  . GERD (gastroesophageal reflux disease)   . Heart murmur   . History of blood transfusion    "just low blood count" (05/13/2013)  . Hyperlipidemia   . Hypertension   . Hypothyroidism   . Hypothyroidism, adult 03/21/2014  . Obesity   . OSA on CPAP   . Peripheral neuropathy ([place])   . Type II diabetes mellitus ([facility])     Review of Systems:   Constitutional: no fevers/chills Respiratory: no shortness of breath Cardiovascular: no chest pain   Physical Exam:  Vitals:   12/24/15 1538 12/24/15 1547  BP:  (!) 161/83  Pulse:  82  Temp:  97.7 F (36.5 C)  TempSrc:  Oral  SpO2:  100%  Weight:  (!) 144.2 kg (317 lb 12.8 oz)  Height: 5\' 10"  (1.778 m) 5\' 10"  (1.778 m)   General Apperance: NAD HEENT: Normocephalic,  atraumatic, anicteric sclera Neck: Supple, trachea midline Lungs: Clear to auscultation bilaterally. No wheezes, rhonchi or rales. Breathing comfortably Heart: Regular rate and rhythm, no murmur/rub/gallop Abdomen: Soft, nontender, nondistended, no rebound/guarding Extremities: Warm and well perfused, no edema Skin: No rashes or lesions Neurologic: Alert and interactive. No gross deficits.   Assessment & Plan:   See Encounters Tab for problem based charting.  Patient discussed with Dr. Eppie Gibson

## 2015-12-24 NOTE — Patient Instructions (Signed)
Please follow up with your endocrinologist as previously instructed.

## 2015-12-25 ENCOUNTER — Other Ambulatory Visit: Payer: Self-pay | Admitting: Pulmonary Disease

## 2015-12-25 NOTE — Telephone Encounter (Signed)
Pt was last seen yesterday by Dr Randell Patient; no f/u appt was scheduled.

## 2015-12-25 NOTE — Progress Notes (Signed)
Case discussed with Dr. Randell Patient soon after the resident saw the patient.  We reviewed the resident's history and exam and pertinent patient test results.  I agree with the assessment, diagnosis and plan of care documented in the resident's note.

## 2015-12-25 NOTE — Assessment & Plan Note (Signed)
Assessment: We have been trying to have her see endocrinology and she has rescheduled multiple times now. Her extremity pain is probably partially related to her uncontrolled diabetes with peripheral neuropathy and may be related to adrenal insufficiency. She feels that I am not addressing her pain despite explaining to her multiple times that we need to get her diabetes under better control and for her to see endocrinology since she took herself off of hydrocortisone previously prescribed for her. I have put her on medications for neuropathic pain and a trial of flexeril. There is room to titrate these medications but she is very angry today and says that she plans to find a new primary care provider.  Plan: Endocrinology appointment this month Follow up prn

## 2015-12-25 NOTE — Assessment & Plan Note (Addendum)
Assessment: Remains noncompliant with her insulin therapy. She did not start her insulin until this past week citing that she did not take it because she could not measure her blood glucose prior to that. She also did not titrate her insulin as previously instructed.  Plan:  Needs to improve compliance of her therapy Her endocrinology follow up was rescheduled for later this month.

## 2016-01-13 ENCOUNTER — Other Ambulatory Visit: Payer: Self-pay | Admitting: Pulmonary Disease

## 2016-01-18 ENCOUNTER — Ambulatory Visit: Payer: Medicare Other | Admitting: Podiatry

## 2016-01-18 ENCOUNTER — Ambulatory Visit: Payer: Medicare Other | Admitting: Endocrinology

## 2016-01-18 NOTE — Addendum Note (Signed)
Addended by: Hulan Fray on: 01/18/2016 08:40 PM   Modules accepted: Orders

## 2016-01-20 ENCOUNTER — Telehealth: Payer: Self-pay | Admitting: Pulmonary Disease

## 2016-01-20 NOTE — Telephone Encounter (Signed)
APT. REMINDER CALL, LMTCB °

## 2016-01-21 ENCOUNTER — Encounter: Payer: Medicare Other | Admitting: Pulmonary Disease

## 2016-01-21 ENCOUNTER — Encounter: Payer: Self-pay | Admitting: Pulmonary Disease

## 2016-01-29 ENCOUNTER — Other Ambulatory Visit: Payer: Self-pay | Admitting: Pulmonary Disease

## 2016-02-08 ENCOUNTER — Telehealth: Payer: Self-pay | Admitting: Student-PharmD

## 2016-02-08 ENCOUNTER — Ambulatory Visit (INDEPENDENT_AMBULATORY_CARE_PROVIDER_SITE_OTHER): Payer: Medicare Other | Admitting: Internal Medicine

## 2016-02-08 DIAGNOSIS — E1142 Type 2 diabetes mellitus with diabetic polyneuropathy: Secondary | ICD-10-CM

## 2016-02-08 DIAGNOSIS — Z23 Encounter for immunization: Secondary | ICD-10-CM

## 2016-02-08 DIAGNOSIS — Z79899 Other long term (current) drug therapy: Secondary | ICD-10-CM

## 2016-02-08 DIAGNOSIS — F17210 Nicotine dependence, cigarettes, uncomplicated: Secondary | ICD-10-CM | POA: Diagnosis not present

## 2016-02-08 DIAGNOSIS — I10 Essential (primary) hypertension: Secondary | ICD-10-CM

## 2016-02-08 DIAGNOSIS — E785 Hyperlipidemia, unspecified: Secondary | ICD-10-CM | POA: Diagnosis not present

## 2016-02-08 DIAGNOSIS — Z794 Long term (current) use of insulin: Secondary | ICD-10-CM | POA: Diagnosis not present

## 2016-02-08 DIAGNOSIS — IMO0002 Reserved for concepts with insufficient information to code with codable children: Secondary | ICD-10-CM

## 2016-02-08 DIAGNOSIS — E1165 Type 2 diabetes mellitus with hyperglycemia: Secondary | ICD-10-CM | POA: Diagnosis present

## 2016-02-08 LAB — GLUCOSE, CAPILLARY: GLUCOSE-CAPILLARY: 484 mg/dL — AB (ref 65–99)

## 2016-02-08 MED ORDER — ATORVASTATIN CALCIUM 20 MG PO TABS: 20.0000 mg | ORAL_TABLET | Freq: Every day | ORAL | 11 refills | Status: DC

## 2016-02-08 MED ORDER — "INSULIN SYRINGE-NEEDLE U-100 31G X 15/64"" 0.5 ML MISC": 6 refills | Status: DC

## 2016-02-08 MED ORDER — METFORMIN HCL ER 500 MG PO TB24: 1500.0000 mg | ORAL_TABLET | Freq: Every day | ORAL | 3 refills | Status: DC

## 2016-02-08 MED ORDER — AMITRIPTYLINE HCL 25 MG PO TABS: 25.0000 mg | ORAL_TABLET | Freq: Every day | ORAL | 0 refills | Status: DC

## 2016-02-08 MED ORDER — LISINOPRIL-HYDROCHLOROTHIAZIDE 10-12.5 MG PO TABS: 1.0000 | ORAL_TABLET | Freq: Every day | ORAL | 0 refills | Status: DC

## 2016-02-08 NOTE — Assessment & Plan Note (Signed)
BP Readings from Last 3 Encounters:  02/08/16 130/68  12/24/15 (!) 161/83  11/26/15 115/69   Her blood pressure was within normal limits today.  Continue with the same dose of lisinopril and hydrochlorothiazide.

## 2016-02-08 NOTE — Progress Notes (Signed)
   CC: For her diabetes follow up.  HPI:  [name] [name] is a 40 y.o.with PMHx as listed below came to the clinic for her uncontrolled diabetes follow-up. She complains of generalized body aches and pain, mostly effecting her hands and feet. She complains of morning stiffness of her hands lasting more than an hour each morning, complain of occasional swelling. She also complained of pain and stiffness of her right knee for about 2 weeks. She was referred to endocrinologist for her uncontrolled diabetes, she was keep rescheduling her appointment. Her next upcoming appointment is on 02/11/2016. She still smokes about 8 cigarettes per day and states that she is trying to decrease. Social drinker and denies any illicit drug use.  Past Medical History:  Diagnosis Date  . Abnormal Pap smear of cervix 2009  . Abscess    history of multiple abscesses  . Anemia of chronic disease 2002  . Anxiety   . Bipolar disorder ([place])   . Cellulitis 05/21/2014   right eye  . CHF (congestive heart failure) ([place])    pt denies this hx on 05/13/2013  . Chronic bronchitis ([place])    "get it q yr" (05/13/2013)  . Chronic pain   . Depression   . Edema of lower extremity   . Endocarditis 2002   subacute bacterial endocarditis.   . Family history of anesthesia complication    "my mom has a hard time coming out from under"  . GERD (gastroesophageal reflux disease)   . Heart murmur   . History of blood transfusion    "just low blood count" (05/13/2013)  . Hyperlipidemia   . Hypertension   . Hypothyroidism   . Hypothyroidism, adult 03/21/2014  . Obesity   . OSA on CPAP   . Peripheral neuropathy ([place])   . Type II diabetes mellitus ([facility])     Review of Systems:  As per HPI  Physical Exam:  Vitals:   02/08/16 1414  BP: 130/68  Pulse: 98  Temp: 97.4 F (36.3 C)  TempSrc: Oral  SpO2: 100%  Weight: (!) 316 lb 11.2 oz (143.7 kg)  Height: 5\' 10"  (1.778 m)   Gen. well-built, well-nourished  obese lady, in no acute distress Musculoskeletal. No edema, no erythema. Range of motion mildly restricted at right knee due to pain. Lungs. Clear bilaterally CV. RRR, No r/m/g. Abdomen. Soft, nontender, obese, bowel sounds positive Neurological. Alert and oriented 3. Strength and sensations grossly intact. Extremities. Trace pedal edema bilaterally. No cyanosis. Pulses 2+ bilaterally.  Assessment & Plan:   See Encounters Tab for problem based charting.  Patient seen with Dr. Dareen Piano

## 2016-02-08 NOTE — Assessment & Plan Note (Signed)
Lipid Panel     Component Value Date/Time   CHOL 230 (H) 08/13/2015 1629   TRIG 168 (H) 08/13/2015 1629   HDL 40 08/13/2015 1629   CHOLHDL 5.8 (H) 08/13/2015 1629   CHOLHDL 4.1 02/03/2014 1017   VLDL 27 02/03/2014 1017   LDLCALC 156 (H) 08/13/2015 1629   She was not on any lipid-lowering medicine. She should be on statin due to her risk factors of being diabetic and hypertension.  Start her on Lipitor 20 mg daily and increase her dose if tolerated.

## 2016-02-08 NOTE — Telephone Encounter (Signed)
Obtained fill history from pharmacy to assess adherence  From [facility]:  [facility]    Blood Glucose Monitoring Suppl (ACCU-CHEK AVIVA PLUS) w/Device KIT    cyclobenzaprine (FLEXERIL) 5 MG tablet - last filled 01/14/16 q 20    glucose blood (ACCU-CHEK AVIVA PLUS) test strip        LANTUS 100 UNIT/ML injection - last filled 01/13/16 q. 1 vial (10 mL)    lisinopril-hydrochlorothiazide (PRINZIDE,ZESTORETIC) 10-12.5 MG tablet - last filled 11/13/15 q 30    LYRICA 50 MG capsule - last filled 01/13/16 q 90    metFORMIN (GLUCOPHAGE-XR) 500 MG 24 hr tablet - last filled 01/18/16 q. [place] PharmD Candidate, c/o 2019 02/08/2016 11:26 AM

## 2016-02-08 NOTE — Patient Instructions (Signed)
It was pleasure taking care of you today. You have very uncontrolled diabetes. Please do not miss your endocrinology appointment on 09/21 at 11.15Am. I am increasing your metformin I am starting you on cholesterol medicine, called Lipitor 20 mg, we will increase it as tolerated. Please stop taking Lyrica and start Amitriptyline 25 mg at bed time and see if it works for your pain.

## 2016-02-08 NOTE — Assessment & Plan Note (Signed)
She has uncontrolled diabetes with A1c more than 14 and today's CBG was 484. She states that whenever she checked her blood sugar at home it stays above 300. She is on Lantus 48 units at bedtime and metformin thousand milligrams daily. She is not willing to check her blood sugar 3 times daily. She was referred to endocrinologist for further management of her uncontrolled diabetes, she has rescheduled that appointment multiple times. She has an upcoming appointment coming on September 21. She might be having type 1 diabetes. Plan. Increase her metformin to 1500 mg daily She should be getting some mealtime coverage with NovoLog , I am little hesitant to add this as she was not willing to check her blood sugar daily. We advised her to not miss this upcoming appointment with endocrinologist, we will let them manage her blood sugar in the future.

## 2016-02-08 NOTE — Assessment & Plan Note (Signed)
She still complains of painful hands and feet accompanied with tingling and numbness. She also complain of generalized body aches and pains. She also complained of stiffness in her hands small joints in her right knee. Her workup done for rheumatoid arthritis in April 2017 was negative. She states that Lyrica does not really help.  Plan. Stop Lyrica Start her on amitriptyline 25 mg at bedtime and titrate accordingly.

## 2016-02-10 ENCOUNTER — Other Ambulatory Visit: Payer: Self-pay | Admitting: Pulmonary Disease

## 2016-02-10 NOTE — Telephone Encounter (Signed)
Pt calls back and states she having cramps and wants to know what to do. Offer pt an appt for tomorrow - unable to come d/t time and have to pick up and drop off children.  She scheduled an appt on 9/25 in [facility].

## 2016-02-10 NOTE — Telephone Encounter (Signed)
Pt called / informed of refill denial - Stated "OK".

## 2016-02-10 NOTE — Telephone Encounter (Signed)
°  cyclobenzaprine (FLEXERIL) 5 MG tablet

## 2016-02-11 ENCOUNTER — Other Ambulatory Visit: Payer: Self-pay | Admitting: *Deleted

## 2016-02-11 ENCOUNTER — Encounter: Payer: Self-pay | Admitting: Endocrinology

## 2016-02-11 ENCOUNTER — Ambulatory Visit (INDEPENDENT_AMBULATORY_CARE_PROVIDER_SITE_OTHER): Payer: Medicare Other | Admitting: Endocrinology

## 2016-02-11 VITALS — BP 120/82 | HR 89 | Temp 97.8°F | Resp 16 | Ht 70.0 in | Wt 307.0 lb

## 2016-02-11 DIAGNOSIS — Z794 Long term (current) use of insulin: Secondary | ICD-10-CM

## 2016-02-11 DIAGNOSIS — R252 Cramp and spasm: Secondary | ICD-10-CM

## 2016-02-11 DIAGNOSIS — E1142 Type 2 diabetes mellitus with diabetic polyneuropathy: Secondary | ICD-10-CM | POA: Diagnosis not present

## 2016-02-11 DIAGNOSIS — E1165 Type 2 diabetes mellitus with hyperglycemia: Secondary | ICD-10-CM | POA: Diagnosis not present

## 2016-02-11 LAB — BASIC METABOLIC PANEL
BUN: 18 mg/dL (ref 6–23)
CO2: 31 mEq/L (ref 19–32)
Calcium: 9.1 mg/dL (ref 8.4–10.5)
Chloride: 102 mEq/L (ref 96–112)
Creatinine, Ser: 0.97 mg/dL (ref 0.40–1.20)
GFR: 81.95 mL/min (ref >60.00)
GLUCOSE: 197 mg/dL — AB (ref 70–99)
POTASSIUM: 4.4 meq/L (ref 3.5–5.1)
SODIUM: 135 meq/L (ref 135–145)

## 2016-02-11 LAB — MAGNESIUM: Magnesium: 1.7 mg/dL (ref 1.5–2.5)

## 2016-02-11 MED ORDER — CANAGLIFLOZIN 100 MG PO TABS: ORAL_TABLET | ORAL | 3 refills | Status: DC

## 2016-02-11 MED ORDER — INSULIN PEN NEEDLE 31G X 5 MM MISC: 2 refills | Status: DC

## 2016-02-11 MED ORDER — INSULIN ASPART 100 UNIT/ML FLEXPEN: 14.0000 [IU] | PEN_INJECTOR | Freq: Three times a day (TID) | SUBCUTANEOUS | 0 refills | Status: DC

## 2016-02-11 NOTE — Patient Instructions (Addendum)
Check blood sugars on waking up  daily  Also check blood sugars about 2 hours after a meal and do this after different meals by rotation  Recommended blood sugar levels on waking up is 90-130 and about 2 hours after meal is 130-180  Please bring your blood sugar monitor to each visit, thank you  Magnesium 2x daily  Lantus 30 units 2x daily  Novolog meal doses 10 units at Bfst and lunch and 14 at supper  Extra for hi sugar  200-250: add 4 more, over 250 add 6 units and over 300 add 8 more  Reduce BP pill in 1/2

## 2016-02-11 NOTE — Progress Notes (Signed)
Internal Medicine Clinic Attending  I saw and evaluated the patient.  I personally confirmed the key portions of the history and exam documented by Dr. Amin and I reviewed pertinent patient test results.  The assessment, diagnosis, and plan were formulated together and I agree with the documentation in the resident's note. 

## 2016-02-11 NOTE — Progress Notes (Signed)
Patient ID: [name] [name], female   DOB: 02/02/1976, 40 y.o.   MRN: [id number]            Reason for Appointment: Consultation for Type 2 Diabetes and adrenal evaluation  Referring physician: Randell Patient   History of Present Illness:          Date of diagnosis of type 2 diabetes mellitus: 1999       Background history:   She had significantly  blood sugars at onset and was started on insulin within the first 3-4 months She thinks she has had difficulty controlling her diabetes for some time A few years ago metformin 1000 mg daily was added Her records indicate that her A1c has been markedly increased with only one A1c below 9% in 2015. During her admission in 12/2014 her sugar was over 610 and she was felt to have ketoacidosis  Recent history:   INSULIN regimen is: 44 Lantus using a vial at bedtime        Non-insulin hypoglycemic drugs the patient is taking are: Metformin ER 1500 mg daily  Current management, blood sugar patterns and problems identified:  She has been on the same dose of Lantus for some time  At some point she was given NovoLog at meals for a short time but this was stopped.  She thinks her blood sugars have been persistently high for a few years  Although her blood sugars are usually over 200 fasting they tend to be higher after meals     She stays thirsty and has had frequent urination  She has difficulty tolerating metformin and does not think that sugars are better with increasing the dose to 3 tablets       Side effects from medications have been: She gets nausea and loose stools with even 500 mg metformin  Compliance with the medical regimen: Fair  Hypoglycemia:   never  Glucose monitoring:  done 1-2 times a day         Glucometer: ?  Accu-Chek       Blood Glucose readings by recall   PREMEAL Breakfast Lunch Dinner Bedtime  Overall   Glucose range: 200+    267-500   Median:        Self-care: The diet that the patient has been following is:  tries to limit .       Typical meal intake: Breakfast is Biscuit, meat,Sometimes none.  Generally has less fried Foods, sugar free drinks, avoiding snacks.                Dietician visit, most recent: never               Exercise: some Walking  Weight history:  Wt Readings from Last 3 Encounters:  02/11/16 (!) 307 lb (139.3 kg)  02/08/16 (!) 316 lb 11.2 oz (143.7 kg)  12/24/15 (!) 317 lb 12.8 oz (144.2 kg)    Glycemic control:   Lab Results  Component Value Date   HGBA1C >14.0 11/26/2015   HGBA1C >14.0 08/13/2015   HGBA1C 15.3 (H) 01/05/2015   Lab Results  Component Value Date   MICROALBUR 113.8 (H) 07/09/2014   LDLCALC 156 (H) 08/13/2015   CREATININE 1.05 (H) 11/26/2015   Lab Results  Component Value Date   MICRALBCREAT 901.0 (H) 07/09/2014     PROBLEM 2:  "Adrenal insufficiency"  During an admission in August 2016 she had cortisol levels done for unknown reasons Initially cortisol was 1.9 and subsequently higher. No Cortrosyn stimulation  test was done but the patient was discharged on hydrocortisone No further evaluation has been done subsequently She did not take the hydrocortisone for long  Currently the patient does not have any symptoms of unusual weakness, lightheadedness, nausea or unexpected weight loss She continues to be on antihypertensive drugs without known hypotension     Medication List       Accurate as of 02/11/16  2:19 PM. Always use your most recent med list.          ACCU-CHEK AVIVA PLUS w/Device Kit Check blood sugar 3 times a day before meals   ACCU-CHEK FASTCLIX LANCETS Misc Check blood sugar 3 times a day before meals   amitriptyline 25 MG tablet Commonly known as:  ELAVIL Take 1 tablet (25 mg total) by mouth at bedtime.   atorvastatin 20 MG tablet Commonly known as:  LIPITOR Take 1 tablet (20 mg total) by mouth daily.   canagliflozin 100 MG Tabs tablet Commonly known as:  INVOKANA 1 tablet before breakfast     cyclobenzaprine 5 MG tablet Commonly known as:  FLEXERIL Take 1 tablet by mouth 3 times daily as needed for muscle spasms   glucose blood test strip Commonly known as:  ACCU-CHEK AVIVA PLUS Check blood sugar 3 times a day before meals   insulin aspart 100 UNIT/ML FlexPen Commonly known as:  NOVOLOG FLEXPEN Inject 14 Units into the skin 3 (three) times daily with meals.   Insulin Pen Needle 31G X 5 MM Misc Commonly known as:  B-D UF III MINI PEN NEEDLES Use 3 per day to inject Insulin   Insulin Syringe-Needle U-100 31G X 15/64" 0.5 ML Misc Use to inject insulin as isntructed   LANTUS 100 UNIT/ML injection Generic drug:  insulin glargine Inject 44 units below the skin at bedtime   lisinopril-hydrochlorothiazide 10-12.5 MG tablet Commonly known as:  PRINZIDE,ZESTORETIC Take 1 tablet by mouth daily.   metFORMIN 500 MG 24 hr tablet Commonly known as:  GLUCOPHAGE-XR Take 3 tablets (1,500 mg total) by mouth daily with breakfast.       Allergies:  Allergies  Allergen Reactions  . Vancomycin Other (See Comments)    Acute renal failure suspected secondary to vanco    Past Medical History:  Diagnosis Date  . Abnormal Pap smear of cervix 2009  . Abscess    history of multiple abscesses  . Anemia of chronic disease 2002  . Anxiety   . Bipolar disorder ([place])   . Cellulitis 05/21/2014   right eye  . CHF (congestive heart failure) ([place])    pt denies this hx on 05/13/2013  . Chronic bronchitis ([place])    "get it q yr" (05/13/2013)  . Chronic pain   . Depression   . Edema of lower extremity   . Endocarditis 2002   subacute bacterial endocarditis.   . Family history of anesthesia complication    "my mom has a hard time coming out from under"  . GERD (gastroesophageal reflux disease)   . Heart murmur   . History of blood transfusion    "just low blood count" (05/13/2013)  . Hyperlipidemia   . Hypertension   . Hypothyroidism   . Hypothyroidism, adult 03/21/2014  .  Obesity   . OSA on CPAP   . Peripheral neuropathy ([place])   . Type II diabetes mellitus ([place])     Past Surgical History:  Procedure Laterality Date  . INCISION AND DRAINAGE ABSCESS     multiple I&Ds  . INCISION AND  DRAINAGE ABSCESS Left 07/09/2012   Procedure: DRESSING CHANGE, THIGH WOUND;  Surgeon: Harl Bowie, MD;  Location: [place] [facility];  Service: General;  Laterality: Left;  . INCISION AND DRAINAGE OF WOUND Left 07/07/2012   Procedure: IRRIGATION AND DEBRIDEMENT WOUND;  Surgeon: Harl Bowie, MD;  Location: [facility];  Service: General;  Laterality: Left;  . INCISION AND DRAINAGE PERIRECTAL ABSCESS Left 07/14/2012   Procedure: DEBRIDEMENT OF SKIN & SOFT TISSUE; DRESSING CHANGE UNDER ANESTHESIA;  Surgeon: Gayland Curry, MD,FACS;  Location: [facility];  Service: General;  Laterality: Left;  . INCISION AND DRAINAGE PERIRECTAL ABSCESS Left 07/16/2012   Procedure: I&D Left Thigh;  Surgeon: Gwenyth Ober, MD;  Location: [facility];  Service: General;  Laterality: Left;  . INCISION AND DRAINAGE PERIRECTAL ABSCESS N/A 01/05/2015   Procedure: IRRIGATION AND DEBRIDEMENT PERIRECTAL ABSCESS;  Surgeon: Donnie Mesa, MD;  Location: [facility];  Service: General;  Laterality: N/A;  . IRRIGATION AND DEBRIDEMENT ABSCESS Left 07/06/2012   Procedure: IRRIGATION AND DEBRIDEMENT ABSCESS BUTTOCKS AND THIGH;  Surgeon: Shann Medal, MD;  Location: [facility];  Service: General;  Laterality: Left;  . IRRIGATION AND DEBRIDEMENT ABSCESS Left 08/10/2012   Procedure: IRRIGATION AND DEBRIDEMENT ABSCESS;  Surgeon: Madilyn Hook, DO;  Location: [facility];  Service: General;  Laterality: Left;  . TONSILLECTOMY  1994    Family History  Problem Relation Age of Onset  . Heart failure Mother   . Diabetes Mother   . Kidney disease Mother   . Kidney disease Father   . Diabetes Paternal Grandmother   . Heart failure Paternal Grandmother     Social History:  reports that she has been smoking Cigarettes.  She has a 3.00 pack-year smoking  history. She has never used smokeless tobacco. She reports that she does not drink alcohol or use drugs.   Review of Systems  Constitutional: Negative for reduced appetite.  Eyes: Positive for blurred vision.  Respiratory: Negative for shortness of breath.   Cardiovascular: Positive for leg swelling.  Gastrointestinal: Positive for nausea and diarrhea.       With Metformin  Endocrine: Positive for fatigue and polydipsia.  Genitourinary: Positive for nocturia.       3x  Musculoskeletal: Positive for muscle cramps.  Skin: Negative for itching.  Neurological: Positive for tingling.       Sometimes gets dizzy spells, usually with some headedness.  Once did nearly passed out with this.  No complaints of lightheadedness on standing up  Psychiatric/Behavioral: Positive for insomnia.    Lipid history: Has hypercholesterolemia, recently prescribed Lipitor 20 mg    Lab Results  Component Value Date   CHOL 230 (H) 08/13/2015   HDL 40 08/13/2015   LDLCALC 156 (H) 08/13/2015   TRIG 168 (H) 08/13/2015   CHOLHDL 5.8 (H) 08/13/2015           Hypertension:Blood pressure has been treated with lisinopril HCT  BP Readings from Last 3 Encounters:  02/11/16 120/82  02/08/16 130/68  12/24/15 (!) 161/83    Most recent eye exam was 2016  Most recent foot exam: 9/17  Passed out 2 wks   LABS:  Office Visit on 02/08/2016  Component Date Value Ref Range Status  . Glucose-Capillary 02/08/2016 484* 65 - 99 mg/dL Final    Physical Examination:  BP 120/82 (Cuff Size: Large)   Pulse 89   Temp 97.8 F (36.6 C)   Resp 16   Ht '5\' 10"'  (1.778 m)   Wt (!) 307 lb (  139.3 kg)   LMP 02/01/2016   SpO2 97%   BMI 44.05 kg/m   GENERAL:         Patient has generalized obesity.   HEENT:         Eye exam shows normal external appearance. Fundus exam shows no retinopathy. Oral exam shows normal mucosa .  NECK:   There is no lymphadenopathy Thyroid is not enlarged and no nodules felt.  Carotids are  normal to palpation and no bruit heard LUNGS:         Chest is symmetrical. Lungs are clear to auscultation.Marland Kitchen   HEART:         Heart sounds:  S1 and S2 are normal. No murmur or click heard., no S3 or S4.   ABDOMEN:   There is no distention present. Liver and spleen are not palpable. No other mass or tenderness present.   NEUROLOGICAL:   Ankle jerks are absent bilaterally.    Diabetic Foot Exam - Simple   Simple Foot Form Diabetic Foot exam was performed with the following findings:  Yes   Visual Inspection See comments:  Yes Sensation Testing See comments:  Yes Pulse Check Posterior Tibialis and Dorsalis pulse intact bilaterally:  Yes Comments Some thickening and pigmentation of lower legs around the ankle and feet Has nearly absent monofilament sensation on the second through fifth toes and decreased on the first toes Pedal pulses normal Skin intact            Vibration sense is Markedly reduced in distal first toes on the right and absent on the left. MUSCULOSKELETAL:  There is no swelling or deformity of the peripheral joints. Spine is normal to inspection.   EXTREMITIES:     There is 1+ lower leg edema. No skin lesions present.Marland Kitchen SKIN:     Minimal acanthosis present of the neck No unusual rash lesions of concern.        ASSESSMENT:  Diabetes type 2, Obese, Persistently uncontrolled  She has been insulin-dependent since the time of diagnosis and also likely insulin resistant With 44 units of Lantus insulin alone in her blood sugars are markedly increased and A1c persistently over 14 now She is also intolerant  of metformin Her diet is reasonably good She is not able to exercise much and not able to lose weight Has not had any significant diabetes education     Complications of diabetes: Peripheral neuropathy, nephropathy and retinopathy  She has painful NEUROPATHY, currently on minimal treatment.  She thinks she has had an no benefit from gabapentin in the past but did  better with Lyrica although this causes drowsiness  History of low cortisol level in 2016: The patient had ketoacidosis and was acutely ill with a very low albumin level making the cortisol level to be unreliable in assessing adrenal function.  No Cortrosyn stimulation test done She has not been on any hydrocortisone for a year and has no symptoms suggestive of adrenal insufficiency. No further evaluation is needed  HYPERTENSION: This appears to be relatively mild and well controlled  Also has history of peripheral edema  Muscle cramps: May be related to hypomagnesemia related to her poorly controlled diabetes   PLAN:     Increase Lantus insulin 60 units and split the dose to twice a day  On the next visit we will consider switching to Toujeo or Tresiba  Start mealtime insulin coverage with Novolog starting with 10 units at breakfast and lunch and 14 at supper  She  will take additional correction doses for high readings over 200  She is a good candidate for an SGLT 2 drug.  Discussed action of SGLT 2 drugs on lowering glucose by decreasing kidney absorption of glucose, benefits of weight loss and lower blood pressure, possible side effects including candidiasis and dosage regimen   Start Invokana 100 mg daily  Consultation with nurse educator  Start checking blood sugars consistently at various times of the day including postprandially and bring monitor for download  Consider consultation with dietitian  OTC magnesium supplements for muscle cramps  May try Lyrica for neuropathic pains, use the maximum tolerated dose is.  Does not benefit from Elavil and can stop this She will reduce lisinopril HCT to half tablet with starting Invokana  Patient Instructions  Check blood sugars on waking up  daily  Also check blood sugars about 2 hours after a meal and do this after different meals by rotation  Recommended blood sugar levels on waking up is 90-130 and about 2 hours after  meal is 130-180  Please bring your blood sugar monitor to each visit, thank you  Magnesium 2x daily  Lantus 30 units 2x daily  Novolog meal doses 10 units at Bfst and lunch and 14 at supper  Extra for hi sugar  200-250: add 4 more, over 250 add 6 units and over 300 add 8 more  Reduce BP pill in 1/2        Counseling time on subjects discussed above is over 50% of today's 60 minute visit   Nabeel Gladson 02/11/2016, 2:19 PM   Note: This office note was prepared with Dragon voice recognition system technology. Any transcriptional errors that result from this process are unintentional.

## 2016-02-15 ENCOUNTER — Ambulatory Visit: Payer: Medicare Other

## 2016-02-16 ENCOUNTER — Encounter: Payer: Self-pay | Admitting: Pulmonary Disease

## 2016-02-17 ENCOUNTER — Telehealth: Payer: Self-pay | Admitting: Nutrition

## 2016-02-17 NOTE — Telephone Encounter (Signed)
Spoke with patient's mother.  [name] [name] was sleeping.  I told her I was not able to keep the appt. With Sephannie on 10/2, and asked her to call the office to reschedule.  Her mother said she would give her the message

## 2016-02-17 NOTE — Telephone Encounter (Signed)
Talked with mother.  Told her that I will be on vacation next week, and her daughter will need to call back to reschedule the appt. With me on October  The 2nd.  Mother reported good understanding of this.

## 2016-02-22 ENCOUNTER — Ambulatory Visit: Payer: Medicare Other | Admitting: Nutrition

## 2016-03-01 ENCOUNTER — Other Ambulatory Visit: Payer: Self-pay | Admitting: Endocrinology

## 2016-03-01 ENCOUNTER — Other Ambulatory Visit: Payer: Self-pay | Admitting: Internal Medicine

## 2016-03-01 DIAGNOSIS — I10 Essential (primary) hypertension: Secondary | ICD-10-CM

## 2016-03-01 DIAGNOSIS — E1142 Type 2 diabetes mellitus with diabetic polyneuropathy: Secondary | ICD-10-CM

## 2016-03-14 ENCOUNTER — Ambulatory Visit (INDEPENDENT_AMBULATORY_CARE_PROVIDER_SITE_OTHER): Payer: Medicare Other | Admitting: Internal Medicine

## 2016-03-14 ENCOUNTER — Encounter: Payer: Self-pay | Admitting: Internal Medicine

## 2016-03-14 ENCOUNTER — Other Ambulatory Visit (HOSPITAL_COMMUNITY)
Admission: RE | Admit: 2016-03-14 | Discharge: 2016-03-14 | Disposition: A | Discharge status: Home / Self Care | Payer: Medicare Other | Source: Ambulatory Visit | Attending: Internal Medicine | Admitting: Internal Medicine

## 2016-03-14 VITALS — BP 77/45 | HR 91 | Temp 98.2°F | Wt 324.2 lb

## 2016-03-14 DIAGNOSIS — N898 Other specified noninflammatory disorders of vagina: Secondary | ICD-10-CM | POA: Insufficient documentation

## 2016-03-14 DIAGNOSIS — N760 Acute vaginitis: Secondary | ICD-10-CM | POA: Diagnosis present

## 2016-03-14 DIAGNOSIS — Z113 Encounter for screening for infections with a predominantly sexual mode of transmission: Secondary | ICD-10-CM | POA: Diagnosis present

## 2016-03-14 DIAGNOSIS — F17210 Nicotine dependence, cigarettes, uncomplicated: Secondary | ICD-10-CM

## 2016-03-14 HISTORY — DX: Other specified noninflammatory disorders of vagina: N89.8

## 2016-03-14 NOTE — Progress Notes (Signed)
   CC: Vaginal pruritus  HPI:  [name] is a 39 y.o. female with PMH as listed below who presents for evaluation of vaginal pruritus.  Patient reports about a 3 week history of vaginal itching and dryness without discharge. She states this began shortly after she was started on Invokana for poorly controlled diabetes. She has not seen any discharge or rash. She thought she may be developing a yeast infection and tried topical OTC miconazole for 6 days once nightly without improvement. She is sexually active with 1 partner. She does report a prior yeast infection at which time she had thick white discharge and an erythematous rash.  Past Medical History:  Diagnosis Date  . Abnormal Pap smear of cervix 2009  . Abscess    history of multiple abscesses  . Anemia of chronic disease 2002  . Anxiety   . Bipolar disorder ([place])   . Cellulitis 05/21/2014   right eye  . CHF (congestive heart failure) ([place])    pt denies this hx on 05/13/2013  . Chronic bronchitis ([place])    "get it q yr" (05/13/2013)  . Chronic pain   . Depression   . Edema of lower extremity   . Endocarditis 2002   subacute bacterial endocarditis.   . Family history of anesthesia complication    "my mom has a hard time coming out from under"  . GERD (gastroesophageal reflux disease)   . Heart murmur   . History of blood transfusion    "just low blood count" (05/13/2013)  . Hyperlipidemia   . Hypertension   . Hypothyroidism   . Hypothyroidism, adult 03/21/2014  . Obesity   . OSA on CPAP   . Peripheral neuropathy ([place])   . Type II diabetes mellitus ([place])     Review of Systems:   Review of Systems  Constitutional: Negative for chills and fever.  Genitourinary: Negative for dysuria, flank pain, frequency, hematuria and urgency.       Vaginal itching and dryness; no discharge, odor, or rash    Physical Exam:  Vitals:   03/14/16 1505  BP: (!) 77/45  Pulse: 91  Temp: 98.2 F (36.8 C)  TempSrc:  Oral  SpO2: 99%  Weight: (!) 324 lb 3.2 oz (147.1 kg)   Physical Exam  Constitutional: She is oriented to person, place, and time. She appears well-developed. No distress.  Obese woman, no acute distress  Genitourinary:    There is no rash or tenderness on the right labia. There is no rash or tenderness on the left labia. No tenderness or bleeding in the vagina. No foreign body in the vagina.  Genitourinary Comments: Cervical os briefly seen, no active discharge at the os. White viscous discharge seen in vaginal vault.  Neurological: She is alert and oriented to person, place, and time.     Assessment & Plan:   See Encounters Tab for problem based charting.  Patient discussed with Dr. Angelia Mould

## 2016-03-14 NOTE — Patient Instructions (Addendum)
It was a pleasure to meet you [name] [name].  I will call you with your results and any new medications.  Please call us if symptoms worsen or do not improve.

## 2016-03-14 NOTE — Assessment & Plan Note (Signed)
Patient reports about a 3 week history of vaginal itching and dryness without discharge. She states this began shortly after she was started on Invokana for poorly controlled diabetes. She has not seen any discharge or rash. She thought she may be developing a yeast infection and tried topical OTC miconazole for 6 days once nightly without improvement. She is sexually active with 1 partner. She does report a prior yeast infection at which time she had thick white discharge and an erythematous rash.  No clear candidal infection on exam although there is white vaginal discharge in the vaginal vault. Wet prep obtained. Patient is at increased risk for UTI and yeast infection on Invokana, although she does report her CBGs have been much better controlled since starting the medication with majority of numbers falling between 100-200. If wet prep is negative, I would still favor a course of Diflucan given her risks; otherwise would treat BV/trichomonas if indicated. She does report to the RN that she recently shaved her pubic region which may be causing some of her irritation as well.

## 2016-03-15 LAB — CERVICOVAGINAL ANCILLARY ONLY
CHLAMYDIA, DNA PROBE: NEGATIVE
Neisseria Gonorrhea: NEGATIVE

## 2016-03-15 NOTE — Progress Notes (Signed)
Internal Medicine Clinic Attending  Case discussed with Dr. Patel,Vishal at the time of the visit.  We reviewed the resident's history and exam and pertinent patient test results.  I agree with the assessment, diagnosis, and plan of care documented in the resident's note.  

## 2016-03-16 ENCOUNTER — Ambulatory Visit (INDEPENDENT_AMBULATORY_CARE_PROVIDER_SITE_OTHER): Payer: Medicare Other | Admitting: Endocrinology

## 2016-03-16 ENCOUNTER — Other Ambulatory Visit: Payer: Self-pay

## 2016-03-16 ENCOUNTER — Encounter: Payer: Self-pay | Admitting: Endocrinology

## 2016-03-16 VITALS — BP 118/72 | HR 91 | Ht 70.0 in | Wt 325.0 lb

## 2016-03-16 DIAGNOSIS — E1165 Type 2 diabetes mellitus with hyperglycemia: Secondary | ICD-10-CM | POA: Diagnosis not present

## 2016-03-16 DIAGNOSIS — E1142 Type 2 diabetes mellitus with diabetic polyneuropathy: Secondary | ICD-10-CM

## 2016-03-16 DIAGNOSIS — Z794 Long term (current) use of insulin: Secondary | ICD-10-CM | POA: Diagnosis not present

## 2016-03-16 DIAGNOSIS — IMO0002 Reserved for concepts with insufficient information to code with codable children: Secondary | ICD-10-CM

## 2016-03-16 LAB — MICROALBUMIN / CREATININE URINE RATIO
CREATININE, U: 60.3 mg/dL
MICROALB UR: 14.7 mg/dL — AB (ref 0.0–1.9)
Microalb Creat Ratio: 24.4 mg/g (ref 0.0–30.0)

## 2016-03-16 LAB — URINALYSIS, ROUTINE W REFLEX MICROSCOPIC
Bilirubin Urine: NEGATIVE
KETONES UR: NEGATIVE
Leukocytes, UA: NEGATIVE
Nitrite: NEGATIVE
SPECIFIC GRAVITY, URINE: 1.01 (ref 1.000–1.030)
Urine Glucose: >=1000 — AB
Urobilinogen, UA: 0.2 (ref 0.0–1.0)
WBC, UA: NONE SEEN (ref 0–?)
pH: 6 (ref 5.0–8.0)

## 2016-03-16 LAB — BASIC METABOLIC PANEL
BUN: 19 mg/dL (ref 6–23)
CALCIUM: 9.3 mg/dL (ref 8.4–10.5)
CO2: 29 mEq/L (ref 19–32)
CREATININE: 1.06 mg/dL (ref 0.40–1.20)
Chloride: 105 mEq/L (ref 96–112)
GFR: 73.94 mL/min (ref >60.00)
Glucose, Bld: 112 mg/dL — ABNORMAL HIGH (ref 70–99)
Potassium: 4.5 mEq/L (ref 3.5–5.1)
Sodium: 139 mEq/L (ref 135–145)

## 2016-03-16 LAB — CERVICOVAGINAL ANCILLARY ONLY: WET PREP (BD AFFIRM): NEGATIVE

## 2016-03-16 MED ORDER — ACCU-CHEK AVIVA PLUS W/DEVICE KIT: 1.0000 | PACK | Freq: Three times a day (TID) | 0 refills | Status: DC

## 2016-03-16 MED ORDER — GLUCOSE BLOOD VI STRP: ORAL_STRIP | 12 refills | Status: DC

## 2016-03-16 NOTE — Patient Instructions (Addendum)
Increase Invokana to 2 daily  Take 1 and then 2 Metformin with main meal  Reduce AM Lantus to 36  Check blood sugars on waking up    Also check blood sugars about 2 hours after a meal and do this after different meals by rotation  Recommended blood sugar levels on waking up is 90-130 and about 2 hours after meal is 130-160  Please bring your blood sugar monitor to each visit, thank you

## 2016-03-16 NOTE — Progress Notes (Signed)
Patient ID: [name] [name], female   DOB: 1975/07/08, 40 y.o.   MRN: [id number]            Reason for Appointment:  Follow-up for Type 2 Diabetes   Referring physician: Randell Patient   History of Present Illness:          Date of diagnosis of type 2 diabetes mellitus: 1999       Background history:   She had significantly  blood sugars at onset and was started on insulin within the first 3-4 months She thinks she has had difficulty controlling her diabetes for some time A few years ago metformin 1000 mg daily was added Her records indicate that her A1c has been markedly increased with only one A1c below 9% in 2015. During her admission in 12/2014 her sugar was over 610 and she was felt to have ketoacidosis  Recent history:   INSULIN regimen is: 40 Lantus bid , NovoLog  10 units ac 3 times a day  Non-insulin hypoglycemic drugs the patient is taking are: Metformin ER 1500 mg daily  Current management, blood sugar patterns and problems identified:  She has been on  Invokana since her initial consultation with 100 mg daily.  She has no symptoms of candidiasis with this   Although she was told to take 30 units of Lantus twice a day and her initial visit her PCP asked her to go up further and she is now taking 40 units   difficult to analyze her blood sugars as she does not have a downloadable monitor and not keeping a record of her blood sugars  However most of her blood sugars appear to be fairly close to target   She has difficulty tolerating metformin and Has not taken any , previously on 1500 mg.   She was supposed to see the diabetes educator but she did not make her appointment.  Has not had any nutritional education or general diabetes education   She has gained a significant amount of weight since her last visit       Side effects from medications have been: She gets nausea and loose stools with even 500 mg metformin  Compliance with the medical regimen: Fair    Hypoglycemia:   never  Glucose monitoring:  done 1-2 times a day         Glucometer: Prodigy        Blood Glucose readings by review of meter, recent range about 90-191, highest before supper Meter has incorrect date and time currently  Self-care: The diet that the patient has been following is: tries to limit fats.       Typical meal intake: Breakfast is Biscuit, meat,Sometimes none.  Generally has less fried Foods, sugar free drinks, avoiding snacks.                Dietician visit, most recent: never               Exercise: some Walking ; limited by neuropathy   Weight history:  Wt Readings from Last 3 Encounters:  03/16/16 (!) 325 lb (147.4 kg)  03/14/16 (!) 324 lb 3.2 oz (147.1 kg)  02/11/16 (!) 307 lb (139.3 kg)    Glycemic control:   Lab Results  Component Value Date   HGBA1C >14.0 11/26/2015   HGBA1C >14.0 08/13/2015   HGBA1C 15.3 (H) 01/05/2015   Lab Results  Component Value Date   MICROALBUR 113.8 (H) 07/09/2014   LDLCALC 156 (H) 08/13/2015  CREATININE 0.97 02/11/2016   Lab Results  Component Value Date   MICRALBCREAT 901.0 (H) 07/09/2014     PROBLEM 2:  "Adrenal insufficiency"  During an admission in August 2016 she had cortisol levels done for unknown reasons Initially cortisol was 1.9 and subsequently higher. No Cortrosyn stimulation test was done but the patient was discharged on hydrocortisone No further evaluation has been done subsequently She did not take the hydrocortisone for long  Currently the patient does not have any symptoms of unusual weakness, lightheadedness, nausea or unexpected weight loss She continues to be on antihypertensive drugs without known hypotension     Medication List       Accurate as of 03/16/16 10:57 AM. Always use your most recent med list.          ACCU-CHEK AVIVA PLUS w/Device Kit Check blood sugar 3 times a day before meals   ACCU-CHEK FASTCLIX LANCETS Misc Check blood sugar 3 times a day before  meals   amitriptyline 25 MG tablet Commonly known as:  ELAVIL TAKE 1 TABLET BY MOUTH AT BEDTIME   atorvastatin 20 MG tablet Commonly known as:  LIPITOR Take 1 tablet (20 mg total) by mouth daily.   canagliflozin 100 MG Tabs tablet Commonly known as:  INVOKANA 1 tablet before breakfast   cyclobenzaprine 5 MG tablet Commonly known as:  FLEXERIL Take 1 tablet by mouth 3 times daily as needed for muscle spasms   glucose blood test strip Commonly known as:  ACCU-CHEK AVIVA PLUS Check blood sugar 3 times a day before meals   Insulin Pen Needle 31G X 5 MM Misc Commonly known as:  B-D UF III MINI PEN NEEDLES Use 3 per day to inject Insulin   Insulin Syringe-Needle U-100 31G X 15/64" 0.5 ML Misc Use to inject insulin as isntructed   LANTUS 100 UNIT/ML injection Generic drug:  insulin glargine Inject 44 units below the skin at bedtime   lisinopril-hydrochlorothiazide 10-12.5 MG tablet Commonly known as:  PRINZIDE,ZESTORETIC TAKE 1 TABLET BY MOUTH EVERY DAY   metFORMIN 500 MG 24 hr tablet Commonly known as:  GLUCOPHAGE-XR Take 3 tablets (1,500 mg total) by mouth daily with breakfast.   NOVOLOG FLEXPEN 100 UNIT/ML FlexPen Generic drug:  insulin aspart Inject 14 units below the skin 3 times daily with meals       Allergies:  Allergies  Allergen Reactions  . Vancomycin Other (See Comments)    Acute renal failure suspected secondary to vanco    Past Medical History:  Diagnosis Date  . Abnormal Pap smear of cervix 2009  . Abscess    history of multiple abscesses  . Anemia of chronic disease 2002  . Anxiety   . Bipolar disorder ([place])   . Cellulitis 05/21/2014   right eye  . CHF (congestive heart failure) ([place])    pt denies this hx on 05/13/2013  . Chronic bronchitis ([place])    "get it q yr" (05/13/2013)  . Chronic pain   . Depression   . Edema of lower extremity   . Endocarditis 2002   subacute bacterial endocarditis.   . Family history of anesthesia  complication    "my mom has a hard time coming out from under"  . GERD (gastroesophageal reflux disease)   . Heart murmur   . History of blood transfusion    "just low blood count" (05/13/2013)  . Hyperlipidemia   . Hypertension   . Hypothyroidism   . Hypothyroidism, adult 03/21/2014  . Obesity   .  OSA on CPAP   . Peripheral neuropathy ([place])   . Type II diabetes mellitus ([place])     Past Surgical History:  Procedure Laterality Date  . INCISION AND DRAINAGE ABSCESS     multiple I&Ds  . INCISION AND DRAINAGE ABSCESS Left 07/09/2012   Procedure: DRESSING CHANGE, THIGH WOUND;  Surgeon: Harl Bowie, MD;  Location: [facility];  Service: General;  Laterality: Left;  . INCISION AND DRAINAGE OF WOUND Left 07/07/2012   Procedure: IRRIGATION AND DEBRIDEMENT WOUND;  Surgeon: Harl Bowie, MD;  Location: [facility];  Service: General;  Laterality: Left;  . INCISION AND DRAINAGE PERIRECTAL ABSCESS Left 07/14/2012   Procedure: DEBRIDEMENT OF SKIN & SOFT TISSUE; DRESSING CHANGE UNDER ANESTHESIA;  Surgeon: Gayland Curry, MD,FACS;  Location: [facility];  Service: General;  Laterality: Left;  . INCISION AND DRAINAGE PERIRECTAL ABSCESS Left 07/16/2012   Procedure: I&D Left Thigh;  Surgeon: Gwenyth Ober, MD;  Location: [facility];  Service: General;  Laterality: Left;  . INCISION AND DRAINAGE PERIRECTAL ABSCESS N/A 01/05/2015   Procedure: IRRIGATION AND DEBRIDEMENT PERIRECTAL ABSCESS;  Surgeon: Donnie Mesa, MD;  Location: [facility];  Service: General;  Laterality: N/A;  . IRRIGATION AND DEBRIDEMENT ABSCESS Left 07/06/2012   Procedure: IRRIGATION AND DEBRIDEMENT ABSCESS BUTTOCKS AND THIGH;  Surgeon: Shann Medal, MD;  Location: [facility];  Service: General;  Laterality: Left;  . IRRIGATION AND DEBRIDEMENT ABSCESS Left 08/10/2012   Procedure: IRRIGATION AND DEBRIDEMENT ABSCESS;  Surgeon: Madilyn Hook, DO;  Location: [facility];  Service: General;  Laterality: Left;  . TONSILLECTOMY  1994    Family History  Problem Relation  Age of Onset  . Heart failure Mother   . Diabetes Mother   . Kidney disease Mother   . Kidney disease Father   . Diabetes Paternal Grandmother   . Heart failure Paternal Grandmother     Social History:  reports that she has been smoking Cigarettes.  She has a 3.00 pack-year smoking history. She has never used smokeless tobacco. She reports that she does not drink alcohol or use drugs.   Review of Systems  Lipid history: Has hypercholesterolemia, PCP has prescribed Lipitor 20 mg     Lab Results  Component Value Date   CHOL 230 (H) 08/13/2015   HDL 40 08/13/2015   LDLCALC 156 (H) 08/13/2015   TRIG 168 (H) 08/13/2015   CHOLHDL 5.8 (H) 08/13/2015           Hypertension:Blood pressure has been treated with lisinopril HCT  BP Readings from Last 3 Encounters:  03/16/16 118/72  03/14/16 (!) 77/45  02/11/16 120/82    Most recent eye exam was 2016  Most recent foot exam: 9/17  She feels dizzy, even when sitting down sometimes  LABS:  Office Visit on 03/14/2016  Component Date Value Ref Range Status  . Chlamydia 03/15/2016 Negative   Final  . Neisseria gonorrhea 03/15/2016 Negative   Final    Physical Examination:  BP 118/72 (Cuff Size: Large)   Pulse 91   Ht _0  (1.778 m)   Wt (!) 325 lb (147.4 kg)   SpO2 98%   BMI 46.63 kg/m        ASSESSMENT:  Diabetes type 2, Obese,  Previously uncontrolled  See history of present illness for detailed discussion of current diabetes management, blood sugar patterns and problems identified  She is now on a regimen of 80 units of Lantus a day, NovoLog 10 units before meals and Invokana 100  mg With increasing her insulin and starting Invokana she has much better blood sugar control However difficult to analyze her blood sugar patterns since her meter could not be downloaded and no records available  She does need diabetes education and meal planning instructions.  she She has been insulin-dependent since the time of  diagnosis and also likely insulin resistant With 44 units of Lantus insulin alone in her blood sugars are markedly increased and A1c persistently over 14 now She is also intolerant  of metformin Her diet is reasonably good She is not able to exercise much and not able to lose weight Has not had any significant diabetes education     Complications of diabetes: Peripheral neuropathy, nephropathy and retinopathy  She has painful NEUROPATHY, currently on Elavil and not reporting any benefit from gabapentin and Lyrica Recommended that she ask her PCP about Cymbalta  HYPERTENSION: This appears to be relatively mild and well controlled, no change with adding Invokana and no orthostasis   PLAN:     Increase Invokana to 2 tablets and then go to 300 mg daily  This will help facilitate reduced doses of insulin and hopefully some weight loss also  Consultation with dietitian  No change in NovoLog as yet.  She will try to use metformin again at least start with 500 mg with her main meal to see if she can tolerate it now  She will start using an Accu-Chek monitor instead of prodigy, prescription for meter and test strips have been sent.  With increasing Invokana will reduce her Lantus to 36  Discussed timing of glucose monitoring and blood sugar targets  Patient Instructions  Increase Invokana to 2 daily  Take 1 and then 2 Metformin with main meal  Reduce AM Lantus to 36  Check blood sugars on waking up    Also check blood sugars about 2 hours after a meal and do this after different meals by rotation  Recommended blood sugar levels on waking up is 90-130 and about 2 hours after meal is 130-160  Please bring your blood sugar monitor to each visit, thank you    Counseling time on subjects discussed above is over 50% of today's 60 minute visit   Sharri Loya 03/16/2016, 10:57 AM   Note: This office note was prepared with Estate agent. Any  transcriptional errors that result from this process are unintentional.

## 2016-03-17 ENCOUNTER — Telehealth: Payer: Self-pay

## 2016-03-17 ENCOUNTER — Telehealth: Payer: Self-pay | Admitting: Endocrinology

## 2016-03-17 LAB — FRUCTOSAMINE: FRUCTOSAMINE: 219 umol/L (ref 0–285)

## 2016-03-17 NOTE — Progress Notes (Signed)
Please let patient know that the overall control looks good, sugar was 112, no protein in the urine now

## 2016-03-17 NOTE — Telephone Encounter (Signed)
Requesting lab result. Please call pt back.  

## 2016-03-17 NOTE — Telephone Encounter (Signed)
Pt stated she is returning your phone call.

## 2016-03-17 NOTE — Telephone Encounter (Signed)
Discussed results with patient, she is appreciative.

## 2016-03-22 MED ORDER — CANAGLIFLOZIN 100 MG PO TABS: ORAL_TABLET | ORAL | 3 refills | Status: DC

## 2016-03-22 NOTE — Telephone Encounter (Signed)
Refill submitted per patient's request.  

## 2016-03-22 NOTE — Telephone Encounter (Signed)
Pt needs invokana called in [facility] to [facility]

## 2016-03-28 ENCOUNTER — Telehealth: Payer: Self-pay | Admitting: Endocrinology

## 2016-03-28 NOTE — Telephone Encounter (Signed)
[facility] is requesting invokana to be increased to twice daily

## 2016-03-28 NOTE — Telephone Encounter (Signed)
Please change the prescription to Invokana 300 mg once daily

## 2016-03-29 ENCOUNTER — Other Ambulatory Visit: Payer: Self-pay

## 2016-03-29 MED ORDER — CANAGLIFLOZIN 300 MG PO TABS: 300.0000 mg | ORAL_TABLET | Freq: Every day | ORAL | 2 refills | Status: DC

## 2016-03-29 NOTE — Telephone Encounter (Signed)
Left message for patient to call back  

## 2016-03-29 NOTE — Telephone Encounter (Signed)
Spoke with patient told her that Dr. Dwyane Dee changed invokana to 300 mg daily and ordered new prescription- patient stated an understanding

## 2016-04-01 ENCOUNTER — Encounter: Payer: Medicare Other | Admitting: Dietician

## 2016-04-04 ENCOUNTER — Encounter (HOSPITAL_COMMUNITY): Payer: Self-pay | Admitting: Emergency Medicine

## 2016-04-04 ENCOUNTER — Emergency Department (HOSPITAL_COMMUNITY)
Admission: EM | Admit: 2016-04-04 | Discharge: 2016-04-04 | Disposition: A | Discharge status: Home / Self Care | Payer: No Typology Code available for payment source | Attending: Emergency Medicine | Admitting: Emergency Medicine

## 2016-04-04 ENCOUNTER — Emergency Department (HOSPITAL_COMMUNITY): Payer: No Typology Code available for payment source

## 2016-04-04 DIAGNOSIS — Z794 Long term (current) use of insulin: Secondary | ICD-10-CM | POA: Diagnosis not present

## 2016-04-04 DIAGNOSIS — E1142 Type 2 diabetes mellitus with diabetic polyneuropathy: Secondary | ICD-10-CM | POA: Insufficient documentation

## 2016-04-04 DIAGNOSIS — S0990XA Unspecified injury of head, initial encounter: Secondary | ICD-10-CM | POA: Diagnosis not present

## 2016-04-04 DIAGNOSIS — E039 Hypothyroidism, unspecified: Secondary | ICD-10-CM | POA: Insufficient documentation

## 2016-04-04 DIAGNOSIS — M542 Cervicalgia: Secondary | ICD-10-CM | POA: Diagnosis not present

## 2016-04-04 DIAGNOSIS — Y999 Unspecified external cause status: Secondary | ICD-10-CM | POA: Insufficient documentation

## 2016-04-04 DIAGNOSIS — Y939 Activity, unspecified: Secondary | ICD-10-CM | POA: Diagnosis not present

## 2016-04-04 DIAGNOSIS — Z79899 Other long term (current) drug therapy: Secondary | ICD-10-CM | POA: Insufficient documentation

## 2016-04-04 DIAGNOSIS — I110 Hypertensive heart disease with heart failure: Secondary | ICD-10-CM | POA: Diagnosis not present

## 2016-04-04 DIAGNOSIS — Z7984 Long term (current) use of oral hypoglycemic drugs: Secondary | ICD-10-CM | POA: Diagnosis not present

## 2016-04-04 DIAGNOSIS — I509 Heart failure, unspecified: Secondary | ICD-10-CM | POA: Insufficient documentation

## 2016-04-04 DIAGNOSIS — S0090XA Unspecified superficial injury of unspecified part of head, initial encounter: Secondary | ICD-10-CM | POA: Diagnosis not present

## 2016-04-04 DIAGNOSIS — Y92410 Unspecified street and highway as the place of occurrence of the external cause: Secondary | ICD-10-CM | POA: Diagnosis not present

## 2016-04-04 DIAGNOSIS — F17210 Nicotine dependence, cigarettes, uncomplicated: Secondary | ICD-10-CM | POA: Insufficient documentation

## 2016-04-04 DIAGNOSIS — S199XXA Unspecified injury of neck, initial encounter: Secondary | ICD-10-CM | POA: Diagnosis not present

## 2016-04-04 DIAGNOSIS — R220 Localized swelling, mass and lump, head: Secondary | ICD-10-CM | POA: Diagnosis not present

## 2016-04-04 MED ORDER — NAPROXEN 500 MG PO TABS: 500.0000 mg | ORAL_TABLET | Freq: Two times a day (BID) | ORAL | 0 refills | Status: DC

## 2016-04-04 MED ORDER — HYDROCODONE-ACETAMINOPHEN 5-325 MG PO TABS: 1.0000 | ORAL_TABLET | Freq: Four times a day (QID) | ORAL | 0 refills | Status: DC | PRN

## 2016-04-04 MED ORDER — HYDROCODONE-ACETAMINOPHEN 5-325 MG PO TABS
1.0000 | ORAL_TABLET | Freq: Once | ORAL | Status: AC
  Administered 2016-04-04: 1 via ORAL
  Filled 2016-04-04: qty 1

## 2016-04-04 NOTE — ED Notes (Signed)
Pt returned from CT °

## 2016-04-04 NOTE — ED Notes (Signed)
Pt at CT
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
## 2016-04-05 ENCOUNTER — Other Ambulatory Visit: Payer: Self-pay | Admitting: Internal Medicine

## 2016-04-08 ENCOUNTER — Other Ambulatory Visit: Payer: Medicare Other

## 2016-04-13 ENCOUNTER — Other Ambulatory Visit: Payer: Medicare Other

## 2016-04-13 ENCOUNTER — Ambulatory Visit: Payer: Medicare Other | Admitting: Endocrinology

## 2016-04-13 DIAGNOSIS — Z0289 Encounter for other administrative examinations: Secondary | ICD-10-CM

## 2016-04-25 ENCOUNTER — Encounter: Payer: Medicare Other | Attending: Endocrinology | Admitting: Dietician

## 2016-04-25 ENCOUNTER — Encounter: Payer: Self-pay | Admitting: Dietician

## 2016-04-25 DIAGNOSIS — E1165 Type 2 diabetes mellitus with hyperglycemia: Secondary | ICD-10-CM | POA: Insufficient documentation

## 2016-04-25 DIAGNOSIS — Z794 Long term (current) use of insulin: Secondary | ICD-10-CM | POA: Diagnosis not present

## 2016-04-25 DIAGNOSIS — Z713 Dietary counseling and surveillance: Secondary | ICD-10-CM | POA: Diagnosis not present

## 2016-04-25 DIAGNOSIS — IMO0002 Reserved for concepts with insufficient information to code with codable children: Secondary | ICD-10-CM

## 2016-04-25 DIAGNOSIS — E1142 Type 2 diabetes mellitus with diabetic polyneuropathy: Secondary | ICD-10-CM

## 2016-04-25 NOTE — Patient Instructions (Signed)
Begin to bake or grill rather than fry. Eat slowly, Stop when you are satisfied. Drink beverages without sugar.  Plan:  Aim for 3 Carb Choices per meal (45 grams) +/- 1 either way  Aim for 0-1 Carbs per snack if hungry  Include protein in moderation with your meals and snacks Consider reading food labels for Total Carbohydrate and Fat Grams of foods Consider  increasing your activity level by armchair exercises for 10 minutes daily as tolerated and increase slowly as tolerated Consider checking BG at alternate times per day as directed by MD  Consider taking medication as directed by MD

## 2016-04-25 NOTE — Progress Notes (Signed)
Medical Nutrition Therapy:  Appt start time: 4656 end time:  8127.  Patient got lost and was 35 minutes late.   Assessment:  Primary concerns today: Patient is here alone and would like to lose weight as well as control her blood sugar.  Coughing severe at times in this appointment.  She states that the coughing has worsened since starting the Lisinopril. She is to talk to her doctor about this.  She checks blood sugar 3 times per day.  She has seen Butch Penny, [place], CDE in the past.  Her last A1C was >14 11/2015, Fructosamine 219 03/16/16.  She states that this is the longest time that she has taken her medication and tried to make changes to manage her diabetes in a long time.  This change since seeing Dr. Dwyane Dee.  She continues to smoke.  Hx noted to include GERD, hyperlipidemia, CHF, and HTN.  She has increased neuropathy and knee pain often.  Wt Readings from Last 3 Encounters:  04/25/16 (!) 335 lb (152 kg)  04/04/16 (!) 325 lb (147.4 kg)  03/16/16 (!) 325 lb (147.4 kg)  02/11/16 307 lbs  Patient lives with her mother and god daughter.  Patient does the shopping and cooking.  Her mother is not well (blind, HD, DM, amputee) and she is caring for her.  She is on disability.  Preferred Learning Style:   No preference indicated   Learning Readiness:   Ready  Change in progress   MEDICATIONS: see list to include Invokana, Lantus 45 units each am and 36 units every HS, Novolog 15 units every meal.  She does not tolerate Metformin.   DIETARY INTAKE:  Usual eating pattern includes 2-3 meals and 0-1 snacks per day. Avoided foods include dark soda.    24-hr recall:  B ( AM): 2 eggs, cheese, grits, 2 tea biscuits OR skips Snk ( AM): none  L ( PM): [place] sandwich, potato chips, regular coolade OR skips Snk ( PM): none D ( PM): spaghetti, green beans, bread OR vege sub from [employer], hamburger helper, salad Snk ( PM): only if blood sugar is low (potato chips or crackers or peanut  butter or NABS) Beverages: sugar free drink mix, regular coolade (occasionally), diet soda, water,   Usual physical activity: tries but increased pain from knee and neuropathy.  The most exercise is housekeeping.    Estimated energy needs: 1600 calories 180 g carbohydrates 120 g protein 44 g fat  Progress Towards Goal(s):  In progress.   Nutritional Diagnosis:  NB-1.1 Food and nutrition-related knowledge deficit As related to balance of carbohydrate, protein, and fat.  As evidenced by diet hx and patient report.    Intervention:  Nutrition counseling and diabetes education initiated. Discussed Carb Counting by food group as method of portion control, reading food labels, and benefits of increased activity. Also discussed basic physiology of Diabetes, target BG ranges pre and post meals, and A1c. Also discussed mindful eating.  Begin to bake or grill rather than fry. Eat slowly, Stop when you are satisfied. Drink beverages without sugar.  Plan:  Aim for 3 Carb Choices per meal (45 grams) +/- 1 either way  Aim for 0-1 Carbs per snack if hungry  Include protein in moderation with your meals and snacks Consider reading food labels for Total Carbohydrate and Fat Grams of foods Consider  increasing your activity level by armchair exercises for 10 minutes daily as tolerated and increase slowly as tolerated Consider checking BG at alternate times per  day as directed by MD  Consider taking medication as directed by MD  Teaching Method Utilized:  Visual Auditory Hands on  Handouts given during visit include:  Food pantry list in [place] list  Counseling list Living Well with Diabetes Meal plan card Meal Plan Card  Snack list  Barriers to learning/adherence to lifestyle change: depression/stress  Demonstrated degree of understanding via:  Teach Back   Monitoring/Evaluation:  Dietary intake, exercise, label reading, and body weight prn during next visit to  Dr. Dwyane Dee.

## 2016-04-28 ENCOUNTER — Other Ambulatory Visit: Payer: Self-pay | Admitting: Pulmonary Disease

## 2016-04-28 DIAGNOSIS — I10 Essential (primary) hypertension: Secondary | ICD-10-CM

## 2016-05-12 ENCOUNTER — Ambulatory Visit (INDEPENDENT_AMBULATORY_CARE_PROVIDER_SITE_OTHER): Payer: Medicare Other | Admitting: Pulmonary Disease

## 2016-05-12 ENCOUNTER — Encounter: Payer: Self-pay | Admitting: Pulmonary Disease

## 2016-05-12 VITALS — BP 150/76 | HR 98 | Temp 97.7°F | Ht 70.0 in | Wt 341.0 lb

## 2016-05-12 DIAGNOSIS — Z79899 Other long term (current) drug therapy: Secondary | ICD-10-CM

## 2016-05-12 DIAGNOSIS — E1165 Type 2 diabetes mellitus with hyperglycemia: Secondary | ICD-10-CM

## 2016-05-12 DIAGNOSIS — I10 Essential (primary) hypertension: Secondary | ICD-10-CM | POA: Diagnosis not present

## 2016-05-12 DIAGNOSIS — F32A Depression, unspecified: Secondary | ICD-10-CM

## 2016-05-12 DIAGNOSIS — IMO0002 Reserved for concepts with insufficient information to code with codable children: Secondary | ICD-10-CM

## 2016-05-12 DIAGNOSIS — F17210 Nicotine dependence, cigarettes, uncomplicated: Secondary | ICD-10-CM | POA: Diagnosis not present

## 2016-05-12 DIAGNOSIS — F329 Major depressive disorder, single episode, unspecified: Secondary | ICD-10-CM

## 2016-05-12 DIAGNOSIS — M25561 Pain in right knee: Secondary | ICD-10-CM | POA: Diagnosis not present

## 2016-05-12 DIAGNOSIS — G4733 Obstructive sleep apnea (adult) (pediatric): Secondary | ICD-10-CM

## 2016-05-12 DIAGNOSIS — F418 Other specified anxiety disorders: Secondary | ICD-10-CM | POA: Diagnosis not present

## 2016-05-12 DIAGNOSIS — Z794 Long term (current) use of insulin: Secondary | ICD-10-CM | POA: Diagnosis not present

## 2016-05-12 DIAGNOSIS — Z9989 Dependence on other enabling machines and devices: Secondary | ICD-10-CM

## 2016-05-12 DIAGNOSIS — R600 Localized edema: Secondary | ICD-10-CM | POA: Diagnosis not present

## 2016-05-12 DIAGNOSIS — E1142 Type 2 diabetes mellitus with diabetic polyneuropathy: Secondary | ICD-10-CM

## 2016-05-12 DIAGNOSIS — F419 Anxiety disorder, unspecified: Secondary | ICD-10-CM

## 2016-05-12 DIAGNOSIS — Z6841 Body Mass Index (BMI) 40.0 and over, adult: Secondary | ICD-10-CM | POA: Diagnosis not present

## 2016-05-12 LAB — GLUCOSE, CAPILLARY: GLUCOSE-CAPILLARY: 127 mg/dL — AB (ref 65–99)

## 2016-05-12 LAB — POCT GLYCOSYLATED HEMOGLOBIN (HGB A1C): HEMOGLOBIN A1C: 7.2

## 2016-05-12 MED ORDER — FUROSEMIDE 40 MG PO TABS: 40.0000 mg | ORAL_TABLET | Freq: Every day | ORAL | 0 refills | Status: DC

## 2016-05-12 MED ORDER — AMITRIPTYLINE HCL 25 MG PO TABS: 25.0000 mg | ORAL_TABLET | Freq: Every day | ORAL | 2 refills | Status: DC

## 2016-05-12 MED ORDER — LIRAGLUTIDE 18 MG/3ML ~~LOC~~ SOPN: PEN_INJECTOR | SUBCUTANEOUS | 1 refills | Status: DC

## 2016-05-12 MED ORDER — LISINOPRIL 20 MG PO TABS: 20.0000 mg | ORAL_TABLET | Freq: Every day | ORAL | 1 refills | Status: DC

## 2016-05-12 MED ORDER — DULOXETINE HCL 30 MG PO CPEP: 30.0000 mg | ORAL_CAPSULE | Freq: Every day | ORAL | 0 refills | Status: DC

## 2016-05-12 NOTE — Patient Instructions (Addendum)
Take Victoza 0.6 mg once daily for 1 week; then increase to 1.2 mg once daily. Take Lasix once a day Take Cymbalta once a day Stop taking lisinopril-HCTZ Start taking lisinopril 20mg  daily Follow up in 1-2 weeks

## 2016-05-12 NOTE — Progress Notes (Signed)
   CC: Leg/arm edema  HPI:  [name] is a 40 y.o. woman with hypertension, HLD, obesity, OSA, hypothyroidism, T2DM, GERD, Anxiety/Depression presenting for evaluation of extremity edema.  Right knee pain that seems to be worse with edema. No erythema or warmth. She has tried heat pads. She still has pain in her hands and feet. Burning in quality. She does not feel that it has improved with the improvement of her diabetes.   She sleeps with two pillows at night. She has a little coughing with less pillows. Denies orthopnea. No PND. Wakes up coughing. Denies dyspnea on exertion.   Past Medical History:  Diagnosis Date  . Abnormal Pap smear of cervix 2009  . Abscess    history of multiple abscesses  . Anemia of chronic disease 2002  . Anxiety   . Bipolar disorder ([place])   . Cellulitis 05/21/2014   right eye  . CHF (congestive heart failure) ([place])    pt denies this hx on 05/13/2013  . Chronic bronchitis ([place])    "get it q yr" (05/13/2013)  . Chronic pain   . Depression   . Edema of lower extremity   . Endocarditis 2002   subacute bacterial endocarditis.   . Family history of anesthesia complication    "my mom has a hard time coming out from under"  . GERD (gastroesophageal reflux disease)   . Heart murmur   . History of blood transfusion    "just low blood count" (05/13/2013)  . Hyperlipidemia   . Hypertension   . Hypothyroidism   . Hypothyroidism, adult 03/21/2014  . Obesity   . OSA on CPAP   . Peripheral neuropathy ([place])   . Type II diabetes mellitus (HCC)     Review of Systems:   No fevers or chills No chest pain  Physical Exam:  Vitals:   05/12/16 1512  BP: (!) 150/76  Pulse: 98  Temp: 97.7 F (36.5 C)  TempSrc: Oral  SpO2: 100%  Weight: (!) 341 lb (154.7 kg)  Height: 5\' 10"  (1.778 m)   General Apperance: NAD HEENT: Normocephalic, atraumatic, anicteric sclera Neck: Supple, trachea midline Lungs: Clear to auscultation bilaterally. No  wheezes, rhonchi or rales. Breathing comfortably Heart: Regular rate and rhythm, no murmur/rub/gallop Abdomen: Soft, nontender, nondistended, no rebound/guarding Extremities: Warm and well perfused, 2+ BLE edema Skin: No rashes or lesions Neurologic: Alert and interactive. No gross deficits.  Assessment & Plan:   See Encounters Tab for problem based charting.  Patient discussed with Dr. Daryll Drown

## 2016-05-13 ENCOUNTER — Encounter: Payer: Self-pay | Admitting: Pulmonary Disease

## 2016-05-13 DIAGNOSIS — R600 Localized edema: Secondary | ICD-10-CM | POA: Insufficient documentation

## 2016-05-13 DIAGNOSIS — G4733 Obstructive sleep apnea (adult) (pediatric): Secondary | ICD-10-CM | POA: Insufficient documentation

## 2016-05-13 HISTORY — DX: Localized edema: R60.0

## 2016-05-13 LAB — CMP14 + ANION GAP
A/G RATIO: 1.1 — AB (ref 1.2–2.2)
ALBUMIN: 3.6 g/dL (ref 3.5–5.5)
ALT: 20 IU/L (ref 0–32)
AST: 16 IU/L (ref 0–40)
Alkaline Phosphatase: 93 IU/L (ref 39–117)
Anion Gap: 20 mmol/L — ABNORMAL HIGH (ref 10.0–18.0)
BILIRUBIN TOTAL: 0.2 mg/dL (ref 0.0–1.2)
BUN / CREAT RATIO: 24 — AB (ref 9–23)
BUN: 24 mg/dL — AB (ref 6–20)
CALCIUM: 9.7 mg/dL (ref 8.7–10.2)
CO2: 23 mmol/L (ref 18–29)
Chloride: 104 mmol/L (ref 96–106)
Creatinine, Ser: 1.01 mg/dL — ABNORMAL HIGH (ref 0.57–1.00)
GFR, EST AFRICAN AMERICAN: 81 mL/min/{1.73_m2} (ref >59)
GFR, EST NON AFRICAN AMERICAN: 70 mL/min/{1.73_m2} (ref >59)
GLOBULIN, TOTAL: 3.4 g/dL (ref 1.5–4.5)
GLUCOSE: 122 mg/dL — AB (ref 65–99)
POTASSIUM: 4.8 mmol/L (ref 3.5–5.2)
Sodium: 147 mmol/L — ABNORMAL HIGH (ref 134–144)
TOTAL PROTEIN: 7 g/dL (ref 6.0–8.5)

## 2016-05-13 LAB — RPR: RPR Ser Ql: NONREACTIVE

## 2016-05-13 NOTE — Assessment & Plan Note (Signed)
Assessment: Distal extremity pain that is ongoing and neuropathic in nature. Recovery from diabetic peripheral neuropathy will likely take more time. Will evaluate for other possible causes of neuropathy - RPR negative.   Plan:  Consider vitamin B12 level, SPEP and UPEP with immunofixation if further worsens Refilled amitriptyline Start Cymbalta

## 2016-05-13 NOTE — Assessment & Plan Note (Signed)
She is interested in a humidifier for her CPAP. Will look into this for her.

## 2016-05-13 NOTE — Assessment & Plan Note (Signed)
Assessment: Hgb A1c is in range now down from <14 to 7.2.  Plan:  Continue Invokana 300mg  daily, Lantus, metformin, Novolog.  Add liraglutide as she is interested in weight loss.

## 2016-05-13 NOTE — Assessment & Plan Note (Addendum)
Assessment: Bilateral LE pitting edema. Last two echos (last in 2694) with no diastolic dysfunction and normal ejection fraction. No signs or symptons of HF. Renal function stable. Normal albumin and LFTs. Possible venous insufficiency. May need to check a 24 hour urine protein  Plan:  Recheck urine protein/creatinine Lasix 40mg  daily  ADDENDUM: 1037.7 mg/g protein to creatinine ratio. Below nephrotic range 3500 mg/g. Will need to continue to titrate lisinopril.

## 2016-05-13 NOTE — Assessment & Plan Note (Signed)
Assessment: She reports she has a lot of anxiety. Her depression has improved with the amitriptyline  Plan: Start Cymbalta 30mg  daily Follow up in 4 to 6 weeks

## 2016-05-13 NOTE — Assessment & Plan Note (Signed)
Still interested in bariatric surgery but worried about cost. Will look into how much her insurance will cover.

## 2016-05-13 NOTE — Assessment & Plan Note (Signed)
Assessment: BP over goal at 150/76.   Plan: Discontinue HCTZ due to starting Lasix Change lisinopril to 20mg  daily Check BMP at follow up

## 2016-05-17 LAB — MICROALBUMIN / CREATININE URINE RATIO
Creatinine, Urine: 36.6 mg/dL
MICROALB/CREAT RATIO: 1037.7 mg/g{creat} — AB (ref 0.0–30.0)
Microalbumin, Urine: 379.8 ug/mL

## 2016-05-18 NOTE — Progress Notes (Signed)
Internal Medicine Clinic Attending  Case discussed with Dr. Krall soon after the resident saw the patient.  We reviewed the resident's history and exam and pertinent patient test results.  I agree with the assessment, diagnosis, and plan of care documented in the resident's note. 

## 2016-05-19 ENCOUNTER — Other Ambulatory Visit: Payer: Self-pay | Admitting: Endocrinology

## 2016-05-20 NOTE — Telephone Encounter (Signed)
Invokana 300 sent to pharmacy [id number] with 0 refills... Appt is needed for further refills

## 2016-05-31 ENCOUNTER — Telehealth: Payer: Self-pay | Admitting: Endocrinology

## 2016-05-31 NOTE — Telephone Encounter (Signed)
-----   Message from Elayne Snare, MD sent at 05/29/2016  8:55 PM EST ----- Regarding: Needs rescheduling, did not show up for follow-up visit for diabetes, will need labs Needs rescheduling, did not show up for follow-up visit for diabetes, will need labs

## 2016-05-31 NOTE — Telephone Encounter (Signed)
LM for pt to call back to schedule appts

## 2016-06-07 ENCOUNTER — Other Ambulatory Visit: Payer: Self-pay | Admitting: Pulmonary Disease

## 2016-06-09 ENCOUNTER — Other Ambulatory Visit: Payer: Self-pay | Admitting: Pulmonary Disease

## 2016-06-23 ENCOUNTER — Other Ambulatory Visit: Payer: Self-pay | Admitting: Pulmonary Disease

## 2016-07-14 ENCOUNTER — Encounter: Payer: Self-pay | Admitting: Pulmonary Disease

## 2016-07-14 ENCOUNTER — Ambulatory Visit (INDEPENDENT_AMBULATORY_CARE_PROVIDER_SITE_OTHER): Payer: Medicare Other | Admitting: Pulmonary Disease

## 2016-07-14 VITALS — BP 102/60 | HR 102 | Temp 98.1°F | Ht 70.0 in | Wt 333.7 lb

## 2016-07-14 DIAGNOSIS — E1165 Type 2 diabetes mellitus with hyperglycemia: Secondary | ICD-10-CM | POA: Diagnosis not present

## 2016-07-14 DIAGNOSIS — E1142 Type 2 diabetes mellitus with diabetic polyneuropathy: Secondary | ICD-10-CM

## 2016-07-14 DIAGNOSIS — Z794 Long term (current) use of insulin: Secondary | ICD-10-CM | POA: Diagnosis not present

## 2016-07-14 DIAGNOSIS — R600 Localized edema: Secondary | ICD-10-CM | POA: Diagnosis not present

## 2016-07-14 DIAGNOSIS — R42 Dizziness and giddiness: Secondary | ICD-10-CM

## 2016-07-14 DIAGNOSIS — F17210 Nicotine dependence, cigarettes, uncomplicated: Secondary | ICD-10-CM

## 2016-07-14 DIAGNOSIS — N182 Chronic kidney disease, stage 2 (mild): Secondary | ICD-10-CM | POA: Diagnosis not present

## 2016-07-14 DIAGNOSIS — IMO0002 Reserved for concepts with insufficient information to code with codable children: Secondary | ICD-10-CM

## 2016-07-14 DIAGNOSIS — E1122 Type 2 diabetes mellitus with diabetic chronic kidney disease: Secondary | ICD-10-CM

## 2016-07-14 LAB — GLUCOSE, CAPILLARY: GLUCOSE-CAPILLARY: 133 mg/dL — AB (ref 65–99)

## 2016-07-14 MED ORDER — FUROSEMIDE 20 MG PO TABS
20.0000 mg | ORAL_TABLET | Freq: Every day | ORAL | 1 refills | Status: DC | PRN
Start: 2016-07-14 — End: 2016-09-08

## 2016-07-14 MED ORDER — AMITRIPTYLINE HCL 25 MG PO TABS
50.0000 mg | ORAL_TABLET | Freq: Every day | ORAL | 2 refills | Status: DC
Start: 2016-07-14 — End: 2016-12-01

## 2016-07-14 NOTE — Progress Notes (Signed)
   CC: peripheral neuropathy  HPI:  [name] [name] is a 41 y.o. woman with history of hypertension, HLD, obesity, OSA, T2DM, GERD, Anxiety/Depression here for follow up of her peripheral neuropathy  Lasix helps with swelling when she does take it. Was not able to start cymbalta because she couldn't swallow the pill.   She has had episodes of presyncope with fall. Sometimes feels lightheaded prior and sometimes legs feel heavy. No vertigo. No head trauma. Can occour at any time. Feels like it started when she started taking care of her diabetes. Doesn't check her blood sugar during these episodes. Her blood sugars are in the 100s.    Past Medical History:  Diagnosis Date  . Abnormal Pap smear of cervix 2009  . Abscess    history of multiple abscesses  . Anemia of chronic disease 2002  . Anxiety   . Bipolar disorder ([place])   . Cellulitis 05/21/2014   right eye  . Chronic bronchitis ([place])    "get it q yr" (05/13/2013)  . Chronic pain   . Depression   . Edema of lower extremity   . Endocarditis 2002   subacute bacterial endocarditis.   . Family history of anesthesia complication    "my mom has a hard time coming out from under"  . GERD (gastroesophageal reflux disease)   . Heart murmur   . History of blood transfusion    "just low blood count" (05/13/2013)  . Hyperlipidemia   . Hypertension   . Hypothyroidism   . Hypothyroidism, adult 03/21/2014  . Obesity   . OSA on CPAP   . Peripheral neuropathy ([place])   . Type II diabetes mellitus (HCC)     Review of Systems:   No chest pain No dyspnea  Physical Exam:  Vitals:   07/14/16 1558  BP: 102/60  Pulse: (!) 102  Temp: 98.1 F (36.7 C)  TempSrc: Oral  SpO2: 100%  Weight: (!) 333 lb 11.2 oz (151.4 kg)  Height: 5\' 10"  (1.778 m)   General Apperance: NAD HEENT: Normocephalic, atraumatic, anicteric sclera Neck: Supple, trachea midline Lungs: Clear to auscultation bilaterally. No wheezes, rhonchi or rales.  Breathing comfortably Heart: Regular rate and rhythm, no murmur/rub/gallop Abdomen: Soft, nontender, nondistended, no rebound/guarding Extremities: Warm and well perfused, 1+ BLE edema Skin: No rashes or lesions Neurologic: Alert and interactive. No gross deficits.  Assessment & Plan:   See Encounters Tab for problem based charting.  Patient discussed with Dr. Evette Doffing

## 2016-07-14 NOTE — Patient Instructions (Addendum)
You may try taking two tablets of amitriptyline. Look out for increased swelling, dizziness.  Please decrease your Lantus to 40u daily. Let us know if your blood sugars start getting worse.  Check your blood sugars when you have your episodes of dizziness. Also please note what way you may be turning your head when these episodes happen.  Follow up in 1 month

## 2016-07-15 DIAGNOSIS — R42 Dizziness and giddiness: Secondary | ICD-10-CM | POA: Insufficient documentation

## 2016-07-15 DIAGNOSIS — Z992 Dependence on renal dialysis: Secondary | ICD-10-CM | POA: Insufficient documentation

## 2016-07-15 DIAGNOSIS — N183 Chronic kidney disease, stage 3 (moderate): Secondary | ICD-10-CM

## 2016-07-15 DIAGNOSIS — N186 End stage renal disease: Secondary | ICD-10-CM | POA: Insufficient documentation

## 2016-07-15 LAB — BMP8+ANION GAP
ANION GAP: 19 mmol/L — AB (ref 10.0–18.0)
BUN/Creatinine Ratio: 33 — ABNORMAL HIGH (ref 9–23)
BUN: 41 mg/dL — ABNORMAL HIGH (ref 6–24)
CALCIUM: 9.4 mg/dL (ref 8.7–10.2)
CHLORIDE: 101 mmol/L (ref 96–106)
CO2: 20 mmol/L (ref 18–29)
Creatinine, Ser: 1.25 mg/dL — ABNORMAL HIGH (ref 0.57–1.00)
GFR, EST AFRICAN AMERICAN: 62 mL/min/{1.73_m2} (ref >59)
GFR, EST NON AFRICAN AMERICAN: 54 mL/min/{1.73_m2} — AB (ref >59)
GLUCOSE: 103 mg/dL — AB (ref 65–99)
POTASSIUM: 4.5 mmol/L (ref 3.5–5.2)
SODIUM: 140 mmol/L (ref 134–144)

## 2016-07-15 NOTE — Assessment & Plan Note (Signed)
CBG in 100s. Follow up in 1 month. Referral placed for yearly retinal exam. Continue Invokana 300mg  daily. Decrease Lantus to 40u daily. Continue Novolog with meals. Continue Victoza 1.2mg  daily. She has stopped taking metofmrin.

## 2016-07-15 NOTE — Assessment & Plan Note (Signed)
Cr increased but within CKD stage 2 GFR range. Will continue to monitor

## 2016-07-15 NOTE — Assessment & Plan Note (Addendum)
Episodes of lightheadedness/dizziness. She falls but does not have lost of consciousness. Negative orthostatic vitals. Possible hypoglycemic episodes though they resolve spontaneously after a few seconds to minutes.   Decrease Lantus to 40u from 44u daily Instructed patient to check her blood sugar during these episodes Also instructed her to make note of which way she is turning her head in case this is BPPV

## 2016-07-15 NOTE — Assessment & Plan Note (Signed)
Assessment: She has bilateral upper extremity and lower extremity neuropathy. Diabetes control seems to be improving greatly with her regimen as her CBGs are now in the 100s.   Plan:  Discontinue cymbalta - has trouble swallowing pill.  Instructed patient she may try increasing her amitriptyline to 50mg  QHS. Referral to neurology per patient request and given the extent of her neuropathy.

## 2016-07-15 NOTE — Assessment & Plan Note (Signed)
Symptomatic improvement with lasix. Will decrease to 20mg  daily as needed.

## 2016-07-19 NOTE — Progress Notes (Signed)
Internal Medicine Clinic Attending  Case discussed with Dr. Krall at the time of the visit.  We reviewed the resident's history and exam and pertinent patient test results.  I agree with the assessment, diagnosis, and plan of care documented in the resident's note.  

## 2016-07-22 ENCOUNTER — Other Ambulatory Visit: Payer: Self-pay | Admitting: Endocrinology

## 2016-07-25 ENCOUNTER — Encounter (HOSPITAL_COMMUNITY): Payer: Self-pay | Admitting: Family Medicine

## 2016-07-25 ENCOUNTER — Ambulatory Visit (HOSPITAL_COMMUNITY)
Admission: EM | Admit: 2016-07-25 | Discharge: 2016-07-25 | Disposition: A | Discharge status: Home / Self Care | Payer: Medicare Other | Attending: Internal Medicine | Admitting: Internal Medicine

## 2016-07-25 DIAGNOSIS — L0291 Cutaneous abscess, unspecified: Secondary | ICD-10-CM | POA: Diagnosis not present

## 2016-07-25 MED ORDER — SULFAMETHOXAZOLE-TRIMETHOPRIM 800-160 MG PO TABS: 1.0000 | ORAL_TABLET | Freq: Two times a day (BID) | ORAL | 0 refills | Status: AC

## 2016-07-25 MED ORDER — HYDROCODONE-ACETAMINOPHEN 5-325 MG PO TABS: 1.0000 | ORAL_TABLET | Freq: Four times a day (QID) | ORAL | 0 refills | Status: DC | PRN

## 2016-07-25 MED ORDER — LIDOCAINE HCL 2 % IJ SOLN
INTRAMUSCULAR | Status: AC
  Filled 2016-07-25: qty 20

## 2016-07-25 MED ORDER — CEPHALEXIN 500 MG PO CAPS: 500.0000 mg | ORAL_CAPSULE | Freq: Two times a day (BID) | ORAL | 0 refills | Status: DC

## 2016-07-25 NOTE — ED Triage Notes (Signed)
Pt here for abscess to abd area.

## 2016-07-25 NOTE — Discharge Instructions (Addendum)
Recheck abscess at urgent care in 2d.  We will change the dressing at that time.  If dressing is saturated, can take it off and put a fresh bandage or maxi pad over the wound.  Prescriptions for antibiotics (bactrim, cephalexin) were sent to the [facility] on [address].  Prescription for small number of vicodin (10 tablets) was given.

## 2016-07-25 NOTE — ED Provider Notes (Signed)
[place]    CSN: [id number] Arrival date & time: 07/25/16  1556     History   Chief Complaint Chief Complaint  Patient presents with  . Abscess    HPI [name] [name] is a 41 y.o. female. 4d induration pain swelling left lower abdomen.  Not pointing.  Prior hx, 1st in a while.  Taking better care of DM.  No fever, malaise.    HPI  Past Medical History:  Diagnosis Date  . Abnormal Pap smear of cervix 2009  . Abscess    history of multiple abscesses  . Anemia of chronic disease 2002  . Anxiety   . Bipolar disorder ([place])   . Cellulitis 05/21/2014   right eye  . Chronic bronchitis ([place])    "get it q yr" (05/13/2013)  . Chronic pain   . Depression   . Edema of lower extremity   . Endocarditis 2002   subacute bacterial endocarditis.   . Family history of anesthesia complication    "my mom has a hard time coming out from under"  . GERD (gastroesophageal reflux disease)   . Heart murmur   . History of blood transfusion    "just low blood count" (05/13/2013)  . Hyperlipidemia   . Hypertension   . Hypothyroidism   . Hypothyroidism, adult 03/21/2014  . Obesity   . OSA on CPAP   . Peripheral neuropathy ([place])   . Type II diabetes mellitus [facility])     Patient Active Problem List   Diagnosis Date Noted  . Lightheadedness 07/15/2016  . CKD (chronic kidney disease) stage 2, GFR 60-89 ml/min 07/15/2016  . Bilateral lower extremity edema 05/13/2016  . OSA on CPAP 05/13/2016  . Vaginal pruritus 03/14/2016  . Morbid obesity due to excess calories ([place]) 11/27/2015  . Callus of foot 11/26/2015  . History of Low serum cortisol level ([place]) 01/07/2015  . Financial difficulty 04/25/2013  . Microcytic anemia 08/24/2012  . Uncontrolled type 2 diabetes mellitus ([place]) 07/12/2012  . Necrotizing fasciitis s/p OR debridements 07/06/2012  . Carpal tunnel syndrome, bilateral 01/25/2012  . Diabetic peripheral neuropathy ([place]) 07/04/2011  . Anxiety and depression  06/02/2011  . ABNORMAL PAP SMEAR, LGSIL 12/13/2007  . GERD 12/06/2007  . Hyperlipidemia 08/29/2007  . Essential hypertension 08/29/2007    Past Surgical History:  Procedure Laterality Date  . INCISION AND DRAINAGE ABSCESS     multiple I&Ds  . INCISION AND DRAINAGE ABSCESS Left 07/09/2012   Procedure: DRESSING CHANGE, THIGH WOUND;  Surgeon: Harl Bowie, MD;  Location: [place];  Service: General;  Laterality: Left;  . INCISION AND DRAINAGE OF WOUND Left 07/07/2012   Procedure: IRRIGATION AND DEBRIDEMENT WOUND;  Surgeon: Harl Bowie, MD;  Location: [place];  Service: General;  Laterality: Left;  . INCISION AND DRAINAGE PERIRECTAL ABSCESS Left 07/14/2012   Procedure: DEBRIDEMENT OF SKIN & SOFT TISSUE; DRESSING CHANGE UNDER ANESTHESIA;  Surgeon: Gayland Curry, MD,FACS;  Location: [place];  Service: General;  Laterality: Left;  . INCISION AND DRAINAGE PERIRECTAL ABSCESS Left 07/16/2012   Procedure: I&D Left Thigh;  Surgeon: Gwenyth Ober, MD;  Location: [place];  Service: General;  Laterality: Left;  . INCISION AND DRAINAGE PERIRECTAL ABSCESS N/A 01/05/2015   Procedure: IRRIGATION AND DEBRIDEMENT PERIRECTAL ABSCESS;  Surgeon: Donnie Mesa, MD;  Location: [facility];  Service: General;  Laterality: N/A;  . IRRIGATION AND DEBRIDEMENT ABSCESS Left 07/06/2012   Procedure: IRRIGATION AND DEBRIDEMENT ABSCESS BUTTOCKS AND THIGH;  Surgeon: Fenton Malling  [place]ia Gaskins, MD;  Location: [place];  Service: General;  Laterality: Left;  . IRRIGATION AND DEBRIDEMENT ABSCESS Left 08/10/2012   Procedure: IRRIGATION AND DEBRIDEMENT ABSCESS;  Surgeon: Madilyn Hook, DO;  Location: [place];  Service: General;  Laterality: Left;  . TONSILLECTOMY  1994      Home Medications    Prior to Admission medications   Medication Sig Start Date End Date Taking? Authorizing Provider  ACCU-CHEK FASTCLIX LANCETS MISC Check blood sugar 3 times a day before meals 08/20/15   Milagros Loll, MD  amitriptyline (ELAVIL) 25 MG tablet Take 2 tablets  (50 mg total) by mouth at bedtime. 07/14/16   Milagros Loll, MD  atorvastatin (LIPITOR) 20 MG tablet Take 1 tablet (20 mg total) by mouth daily. 02/08/16 02/07/17  Lorella Nimrod, MD  Blood Glucose Monitoring Suppl (ACCU-CHEK AVIVA PLUS) w/Device KIT 1 each by Does not apply route 3 (three) times daily. 03/16/16   Elayne Snare, MD  cephALEXin (KEFLEX) 500 MG capsule Take 1 capsule (500 mg total) by mouth 2 (two) times daily. 07/25/16   Sherlene Shams, MD  furosemide (LASIX) 20 MG tablet Take 1 tablet (20 mg total) by mouth daily as needed for edema. 07/14/16   Milagros Loll, MD  glucose blood (ACCU-CHEK AVIVA PLUS) test strip Check blood sugar 3 times a day before meals 03/16/16   Elayne Snare, MD  HYDROcodone-acetaminophen (NORCO/VICODIN) 5-325 MG tablet Take 1 tablet by mouth 4 (four) times daily as needed. 07/25/16   Sherlene Shams, MD  Insulin Pen Needle (B-D UF III MINI PEN NEEDLES) 31G X 5 MM MISC Use 3 per day to inject Insulin 02/11/16   Elayne Snare, MD  Insulin Syringe-Needle U-100 31G X 15/64" 0.5 ML MISC Use to inject insulin as isntructed 02/08/16   Lorella Nimrod, MD  INVOKANA 300 MG TABS tablet Take 1 tablet by mouth daily before breakfast 05/20/16   Elayne Snare, MD  LANTUS 100 UNIT/ML injection Inject 44 units below the skin at bedtime 04/07/16   Milagros Loll, MD  liraglutide (VICTOZA) 18 MG/3ML SOPN Inject 0.2 mLs (1.2 mg total) into the skin daily. 06/08/16   Milagros Loll, MD  lisinopril (PRINIVIL,ZESTRIL) 20 MG tablet TAKE 1 TABLET BY MOUTH EVERY DAY 06/24/16   Milagros Loll, MD  naproxen (NAPROSYN) 500 MG tablet Take 1 tablet (500 mg total) by mouth 2 (two) times daily. Patient not taking: Reported on 04/25/2016 04/04/16   Fredia Sorrow, MD  NOVOLOG FLEXPEN 100 UNIT/ML FlexPen Inject 14 units below the skin 3 times daily with meals 03/01/16   Elayne Snare, MD  sulfamethoxazole-trimethoprim (BACTRIM DS,SEPTRA DS) 800-160 MG tablet Take 1 tablet by mouth 2 (two) times daily. 07/25/16 08/04/16   Sherlene Shams, MD    Family History Family History  Problem Relation Age of Onset  . Heart failure Mother   . Diabetes Mother   . Kidney disease Mother   . Kidney disease Father   . Diabetes Paternal Grandmother   . Heart failure Paternal Grandmother     Social History Social History  Substance Use Topics  . Smoking status: Current Every Day Smoker    Packs/day: 0.50    Years: 6.00    Types: Cigarettes  . Smokeless tobacco: Never Used  . Alcohol use 0.0 oz/week     Comment: Socially.     Allergies   Vancomycin   Review of Systems Review of Systems  All other systems reviewed and are negative.  Physical Exam Triage Vital Signs ED Triage Vitals [07/25/16 1617]  Enc Vitals Group     BP 95/62     Pulse Rate 103     Resp 14     Temp 99 F (37.2 C)     Temp Source Oral     SpO2 100 %     Weight      Height      Pain Score 10     Pain Loc    Updated Vital Signs BP 95/62 (BP Location: Right Arm)   Pulse 103   Temp 99 F (37.2 C) (Oral)   Resp 14   SpO2 100%   Physical Exam  Constitutional: She is oriented to person, place, and time. No distress.  Alert, nicely groomed  HENT:  Head: Atraumatic.  Eyes:  Conjugate gaze, no eye redness/drainage  Neck: Neck supple.  Cardiovascular: Normal rate.   Pulmonary/Chest: No respiratory distress.  Lungs clear, symmetric breath sounds  Abdominal: She exhibits no distension.  Musculoskeletal: Normal range of motion.  No leg swelling  Neurological: She is alert and oriented to person, place, and time.  Skin: Skin is warm and dry.  No cyanosis 5 x 7 inch indurated tender warm swollen area  Nursing note and vitals reviewed.    UC Treatments / Results   Procedures Procedures (including critical care time) Skin prepped with iodine and infiltrated with 2% lidocaine without epi.  Stab incision made with a #11 blade and copious bloody/purulent drainage was released.  Persistent induration medial to incision  site.  Probed for loculations.  Packing placed.     Final Clinical Impressions(s) / UC Diagnoses   Final diagnoses:  Abscess   Recheck abscess at urgent care in 2d.  We will change the dressing at that time.  If dressing is saturated, can take it off and put a fresh bandage or maxi pad over the wound.  Prescriptions for antibiotics (bactrim, cephalexin) [facility] to the [facility] on [address].  Prescription for small number of vicodin (10 tablets) was given.   New Prescriptions New Prescriptions   CEPHALEXIN (KEFLEX) 500 MG CAPSULE    Take 1 capsule (500 mg total) by mouth 2 (two) times daily.   HYDROCODONE-ACETAMINOPHEN (NORCO/VICODIN) 5-325 MG TABLET    Take 1 tablet by mouth 4 (four) times daily as needed.   SULFAMETHOXAZOLE-TRIMETHOPRIM (BACTRIM DS,SEPTRA DS) 800-160 MG TABLET    Take 1 tablet by mouth 2 (two) times daily.     Sherlene Shams, MD 07/27/16 2132

## 2016-07-26 ENCOUNTER — Other Ambulatory Visit: Payer: Self-pay | Admitting: Endocrinology

## 2016-07-27 ENCOUNTER — Ambulatory Visit (HOSPITAL_COMMUNITY)
Admission: EM | Admit: 2016-07-27 | Discharge: 2016-07-27 | Disposition: A | Discharge status: Home / Self Care | Payer: Medicare Other | Attending: Family Medicine | Admitting: Family Medicine

## 2016-07-27 ENCOUNTER — Encounter (HOSPITAL_COMMUNITY): Payer: Self-pay | Admitting: Family Medicine

## 2016-07-27 DIAGNOSIS — L02211 Cutaneous abscess of abdominal wall: Secondary | ICD-10-CM | POA: Diagnosis not present

## 2016-07-27 MED ORDER — HYDROCODONE-ACETAMINOPHEN 5-325 MG PO TABS: 1.0000 | ORAL_TABLET | Freq: Four times a day (QID) | ORAL | 0 refills | Status: DC | PRN

## 2016-07-27 NOTE — ED Triage Notes (Signed)
Same amount of pain.  Abscess to left abdomen

## 2016-07-27 NOTE — ED Provider Notes (Signed)
[place]    CSN: [id number] Arrival date & time: 07/27/16  1053     History   Chief Complaint Chief Complaint  Patient presents with  . Follow-up    HPI [name] [name] is a 41 y.o. female.   Is a 41 year old woman who was seen here at this urgent care center 2 days ago for an abscess. The abscess was incised and drained and packed and patient was started on Keflex as well as Septra. She filled the prescriptions on Monday. She's continued to drain from the incision site. She has a significant amount of discomfort but she is not running a fever.  Patient has a long history of recurrent abscesses caused by MRSA. She's had diabetes it's been fairly well controlled over the last year. In the past for problems with abscesses seem to be related to elevated sugar. Since she's had an abscess this time, her blood sugars have been elevated.      Past Medical History:  Diagnosis Date  . Abnormal Pap smear of cervix 2009  . Abscess    history of multiple abscesses  . Anemia of chronic disease 2002  . Anxiety   . Bipolar disorder ([place])   . Cellulitis 05/21/2014   right eye  . Chronic bronchitis ([place])    "get it q yr" (05/13/2013)  . Chronic pain   . Depression   . Edema of lower extremity   . Endocarditis 2002   subacute bacterial endocarditis.   . Family history of anesthesia complication    "my mom has a hard time coming out from under"  . GERD (gastroesophageal reflux disease)   . Heart murmur   . History of blood transfusion    "just low blood count" (05/13/2013)  . Hyperlipidemia   . Hypertension   . Hypothyroidism   . Hypothyroidism, adult 03/21/2014  . Obesity   . OSA on CPAP   . Peripheral neuropathy ([place])   . Type II diabetes mellitus [facility])     Patient Active Problem List   Diagnosis Date Noted  . Lightheadedness 07/15/2016  . CKD (chronic kidney disease) stage 2, GFR 60-89 ml/min 07/15/2016  . Bilateral lower extremity edema 05/13/2016    . OSA on CPAP 05/13/2016  . Vaginal pruritus 03/14/2016  . Morbid obesity due to excess calories ([place]) 11/27/2015  . Callus of foot 11/26/2015  . History of Low serum cortisol level ([place]) 01/07/2015  . Financial difficulty 04/25/2013  . Microcytic anemia 08/24/2012  . Uncontrolled type 2 diabetes mellitus ([place]) 07/12/2012  . Necrotizing fasciitis s/p OR debridements 07/06/2012  . Carpal tunnel syndrome, bilateral 01/25/2012  . Diabetic peripheral neuropathy ([place]) 07/04/2011  . Anxiety and depression 06/02/2011  . ABNORMAL PAP SMEAR, LGSIL 12/13/2007  . GERD 12/06/2007  . Hyperlipidemia 08/29/2007  . Essential hypertension 08/29/2007    Past Surgical History:  Procedure Laterality Date  . INCISION AND DRAINAGE ABSCESS     multiple I&Ds  . INCISION AND DRAINAGE ABSCESS Left 07/09/2012   Procedure: DRESSING CHANGE, THIGH WOUND;  Surgeon: Harl Bowie, MD;  Location: [place];  Service: General;  Laterality: Left;  . INCISION AND DRAINAGE OF WOUND Left 07/07/2012   Procedure: IRRIGATION AND DEBRIDEMENT WOUND;  Surgeon: Harl Bowie, MD;  Location: [place];  Service: General;  Laterality: Left;  . INCISION AND DRAINAGE PERIRECTAL ABSCESS Left 07/14/2012   Procedure: DEBRIDEMENT OF SKIN & SOFT TISSUE; DRESSING CHANGE UNDER ANESTHESIA;  Surgeon: Gayland Curry, MD,FACS;  Location: [facility]  OR;  Service: General;  Laterality: Left;  . INCISION AND DRAINAGE PERIRECTAL ABSCESS Left 07/16/2012   Procedure: I&D Left Thigh;  Surgeon: Gwenyth Ober, MD;  Location: [place];  Service: General;  Laterality: Left;  . INCISION AND DRAINAGE PERIRECTAL ABSCESS N/A 01/05/2015   Procedure: IRRIGATION AND DEBRIDEMENT PERIRECTAL ABSCESS;  Surgeon: Donnie Mesa, MD;  Location: [place];  Service: General;  Laterality: N/A;  . IRRIGATION AND DEBRIDEMENT ABSCESS Left 07/06/2012   Procedure: IRRIGATION AND DEBRIDEMENT ABSCESS BUTTOCKS AND THIGH;  Surgeon: Shann Medal, MD;  Location: [place];  Service: General;   Laterality: Left;  . IRRIGATION AND DEBRIDEMENT ABSCESS Left 08/10/2012   Procedure: IRRIGATION AND DEBRIDEMENT ABSCESS;  Surgeon: Madilyn Hook, DO;  Location: [place];  Service: General;  Laterality: Left;  . TONSILLECTOMY  1994    OB History    No data available       Home Medications    Prior to Admission medications   Medication Sig Start Date End Date Taking? Authorizing Provider  ACCU-CHEK FASTCLIX LANCETS MISC Check blood sugar 3 times a day before meals 08/20/15   Milagros Loll, MD  amitriptyline (ELAVIL) 25 MG tablet Take 2 tablets (50 mg total) by mouth at bedtime. 07/14/16   Milagros Loll, MD  atorvastatin (LIPITOR) 20 MG tablet Take 1 tablet (20 mg total) by mouth daily. 02/08/16 02/07/17  Lorella Nimrod, MD  Blood Glucose Monitoring Suppl (ACCU-CHEK AVIVA PLUS) w/Device KIT 1 each by Does not apply route 3 (three) times daily. 03/16/16   Elayne Snare, MD  cephALEXin (KEFLEX) 500 MG capsule Take 1 capsule (500 mg total) by mouth 2 (two) times daily. 07/25/16   Sherlene Shams, MD  furosemide (LASIX) 20 MG tablet Take 1 tablet (20 mg total) by mouth daily as needed for edema. 07/14/16   Milagros Loll, MD  glucose blood (ACCU-CHEK AVIVA PLUS) test strip Check blood sugar 3 times a day before meals 03/16/16   Elayne Snare, MD  HYDROcodone-acetaminophen (NORCO/VICODIN) 5-325 MG tablet Take 1 tablet by mouth 4 (four) times daily as needed. 07/27/16   Robyn Haber, MD  Insulin Pen Needle (B-D UF III MINI PEN NEEDLES) 31G X 5 MM MISC Use 3 per day to inject Insulin 02/11/16   Elayne Snare, MD  Insulin Syringe-Needle U-100 31G X 15/64" 0.5 ML MISC Use to inject insulin as isntructed 02/08/16   Lorella Nimrod, MD  INVOKANA 300 MG TABS tablet Take 1 tablet by mouth daily before breakfast 07/27/16   Elayne Snare, MD  LANTUS 100 UNIT/ML injection Inject 44 units below the skin at bedtime 04/07/16   Milagros Loll, MD  liraglutide (VICTOZA) 18 MG/3ML SOPN Inject 0.2 mLs (1.2 mg total) into the skin  daily. 06/08/16   Milagros Loll, MD  lisinopril (PRINIVIL,ZESTRIL) 20 MG tablet TAKE 1 TABLET BY MOUTH EVERY DAY 06/24/16   Milagros Loll, MD  naproxen (NAPROSYN) 500 MG tablet Take 1 tablet (500 mg total) by mouth 2 (two) times daily. Patient not taking: Reported on 04/25/2016 04/04/16   Fredia Sorrow, MD  NOVOLOG FLEXPEN 100 UNIT/ML FlexPen Inject 14 units below the skin 3 times daily with meals 03/01/16   Elayne Snare, MD  sulfamethoxazole-trimethoprim (BACTRIM DS,SEPTRA DS) 800-160 MG tablet Take 1 tablet by mouth 2 (two) times daily. 07/25/16 08/04/16  Sherlene Shams, MD    Family History Family History  Problem Relation Age of Onset  . Heart failure Mother   . Diabetes Mother   .  Kidney disease Mother   . Kidney disease Father   . Diabetes Paternal Grandmother   . Heart failure Paternal Grandmother     Social History Social History  Substance Use Topics  . Smoking status: Current Every Day Smoker    Packs/day: 0.50    Years: 6.00    Types: Cigarettes  . Smokeless tobacco: Never Used  . Alcohol use 0.0 oz/week     Comment: Socially.     Allergies   Vancomycin   Review of Systems Review of Systems  Constitutional: Negative.   HENT: Negative.   Respiratory: Negative.   Cardiovascular: Negative.   Gastrointestinal: Positive for abdominal pain.  Genitourinary: Negative.   Skin: Positive for wound.  Neurological: Negative.      Physical Exam Triage Vital Signs ED Triage Vitals  Enc Vitals Group     BP      Pulse      Resp      Temp      Temp src      SpO2      Weight      Height      Head Circumference      Peak Flow      Pain Score      Pain Loc      Pain Edu?      Excl. in [place]?    No data found.   Updated Vital Signs There were no vitals taken for this visit.   Physical Exam  Constitutional: She is oriented to person, place, and time. She appears well-developed and well-nourished.  HENT:  Right Ear: External ear normal.  Left Ear:  External ear normal.  Mouth/Throat: Oropharynx is clear and moist.  Eyes: Conjunctivae are normal. Pupils are equal, round, and reactive to light.  Neck: Normal range of motion. Neck supple. No thyromegaly present.  Cardiovascular: Normal rate and regular rhythm.   Pulmonary/Chest: Effort normal.  Musculoskeletal: Normal range of motion.  Neurological: She is alert and oriented to person, place, and time.  Skin: Skin is warm.  There is an indurated area in the left lower quadrant that measures 4 x 5". There is a central incision and the drain was removed. There is small amount of purulent sanguinous fluid expressed. She is quite tender in the area.  Nursing note and vitals reviewed.    UC Treatments / Results  Labs (all labs ordered are listed, but only abnormal results are displayed) Labs Reviewed - No data to display  EKG  EKG Interpretation None       Radiology No results found.  Procedures Procedures (including critical care time)  Medications Ordered in UC Medications - No data to display   Initial Impression / Assessment and Plan / UC Course  I have reviewed the triage vital signs and the nursing notes.  Pertinent labs & imaging results that were available during my care of the patient were reviewed by me and considered in my medical decision making (see chart for details).     Final Clinical Impressions(s) / UC Diagnoses   Final diagnoses:  Abscess of abdominal wall    New Prescriptions Current Discharge Medication List    Pain medicine refilled Patient to apply moist hot compresses to the wound area at least 4 times a day Patient to continue her antibiotics as directed Patient to return here in 2 days or see her primary care doctor.   Robyn Haber, MD 07/27/16 [id number]

## 2016-07-27 NOTE — Discharge Instructions (Signed)
Please return to the urgent care or your primary care doctor on Friday for follow-up. Continue to apply moist, hot compresses to the area and then redress the wound. These wound dressing changes at least 4 times a day.

## 2016-07-29 ENCOUNTER — Ambulatory Visit (HOSPITAL_COMMUNITY)
Admission: EM | Admit: 2016-07-29 | Discharge: 2016-07-29 | Disposition: A | Discharge status: Home / Self Care | Payer: Medicare Other | Attending: Emergency Medicine | Admitting: Emergency Medicine

## 2016-07-29 ENCOUNTER — Encounter (HOSPITAL_COMMUNITY): Payer: Self-pay | Admitting: Emergency Medicine

## 2016-07-29 DIAGNOSIS — L02211 Cutaneous abscess of abdominal wall: Secondary | ICD-10-CM | POA: Diagnosis not present

## 2016-07-29 MED ORDER — HYDROCODONE-ACETAMINOPHEN 5-325 MG PO TABS: 1.0000 | ORAL_TABLET | ORAL | 0 refills | Status: DC | PRN

## 2016-07-29 NOTE — ED Triage Notes (Signed)
Follow up to an abscess that was drained 5 days ago.

## 2016-07-29 NOTE — ED Provider Notes (Signed)
CSN: [id number]     Arrival date & time 07/29/16  58 History   First MD Initiated Contact with Patient 07/29/16 1406     Chief Complaint  Patient presents with  . Follow-up   (Consider location/radiation/quality/duration/timing/severity/associated sxs/prior Treatment) 41 year old female returns a second time for wound check status post I&D of abscess to the left lower abdomen. The patient was placed on a combination of cephalexin and Septra. She states she does not see any change and the abscess. Still having pain and tenderness. There is a scant amount of purulent drainage at the site of the incision. Denies systemic symptoms.      Past Medical History:  Diagnosis Date  . Abnormal Pap smear of cervix 2009  . Abscess    history of multiple abscesses  . Anemia of chronic disease 2002  . Anxiety   . Bipolar disorder ([place])   . Cellulitis 05/21/2014   right eye  . Chronic bronchitis ([place])    "get it q yr" (05/13/2013)  . Chronic pain   . Depression   . Edema of lower extremity   . Endocarditis 2002   subacute bacterial endocarditis.   . Family history of anesthesia complication    "my mom has a hard time coming out from under"  . GERD (gastroesophageal reflux disease)   . Heart murmur   . History of blood transfusion    "just low blood count" (05/13/2013)  . Hyperlipidemia   . Hypertension   . Hypothyroidism   . Hypothyroidism, adult 03/21/2014  . Obesity   . OSA on CPAP   . Peripheral neuropathy ([place])   . Type II diabetes mellitus ([place])    Past Surgical History:  Procedure Laterality Date  . INCISION AND DRAINAGE ABSCESS     multiple I&Ds  . INCISION AND DRAINAGE ABSCESS Left 07/09/2012   Procedure: DRESSING CHANGE, THIGH WOUND;  Surgeon: Harl Bowie, MD;  Location: [place];  Service: General;  Laterality: Left;  . INCISION AND DRAINAGE OF WOUND Left 07/07/2012   Procedure: IRRIGATION AND DEBRIDEMENT WOUND;  Surgeon: Harl Bowie, MD;  Location: [place];   Service: General;  Laterality: Left;  . INCISION AND DRAINAGE PERIRECTAL ABSCESS Left 07/14/2012   Procedure: DEBRIDEMENT OF SKIN & SOFT TISSUE; DRESSING CHANGE UNDER ANESTHESIA;  Surgeon: Gayland Curry, MD,FACS;  Location: [place];  Service: General;  Laterality: Left;  . INCISION AND DRAINAGE PERIRECTAL ABSCESS Left 07/16/2012   Procedure: I&D Left Thigh;  Surgeon: Gwenyth Ober, MD;  Location: [place];  Service: General;  Laterality: Left;  . INCISION AND DRAINAGE PERIRECTAL ABSCESS N/A 01/05/2015   Procedure: IRRIGATION AND DEBRIDEMENT PERIRECTAL ABSCESS;  Surgeon: Donnie Mesa, MD;  Location: [place];  Service: General;  Laterality: N/A;  . IRRIGATION AND DEBRIDEMENT ABSCESS Left 07/06/2012   Procedure: IRRIGATION AND DEBRIDEMENT ABSCESS BUTTOCKS AND THIGH;  Surgeon: Shann Medal, MD;  Location: [place];  Service: General;  Laterality: Left;  . IRRIGATION AND DEBRIDEMENT ABSCESS Left 08/10/2012   Procedure: IRRIGATION AND DEBRIDEMENT ABSCESS;  Surgeon: Madilyn Hook, DO;  Location: [place];  Service: General;  Laterality: Left;  . TONSILLECTOMY  1994   Family History  Problem Relation Age of Onset  . Heart failure Mother   . Diabetes Mother   . Kidney disease Mother   . Kidney disease Father   . Diabetes Paternal Grandmother   . Heart failure Paternal Grandmother    Social History  Substance Use Topics  . Smoking status: Current  Every Day Smoker    Packs/day: 0.50    Years: 6.00    Types: Cigarettes  . Smokeless tobacco: Never Used  . Alcohol use 0.0 oz/week     Comment: Socially.   OB History    No data available     Review of Systems  Constitutional: Negative.   Respiratory: Negative.   Skin:       As per history of present illness.  Neurological: Negative.   All other systems reviewed and are negative.   Allergies  Vancomycin  Home Medications   Prior to Admission medications   Medication Sig Start Date End Date Taking? Authorizing Provider  ACCU-CHEK FASTCLIX LANCETS  MISC Check blood sugar 3 times a day before meals 08/20/15   Milagros Loll, MD  amitriptyline (ELAVIL) 25 MG tablet Take 2 tablets (50 mg total) by mouth at bedtime. 07/14/16   Milagros Loll, MD  atorvastatin (LIPITOR) 20 MG tablet Take 1 tablet (20 mg total) by mouth daily. 02/08/16 02/07/17  Lorella Nimrod, MD  Blood Glucose Monitoring Suppl (ACCU-CHEK AVIVA PLUS) w/Device KIT 1 each by Does not apply route 3 (three) times daily. 03/16/16   Elayne Snare, MD  cephALEXin (KEFLEX) 500 MG capsule Take 1 capsule (500 mg total) by mouth 2 (two) times daily. 07/25/16   Sherlene Shams, MD  furosemide (LASIX) 20 MG tablet Take 1 tablet (20 mg total) by mouth daily as needed for edema. 07/14/16   Milagros Loll, MD  glucose blood (ACCU-CHEK AVIVA PLUS) test strip Check blood sugar 3 times a day before meals 03/16/16   Elayne Snare, MD  HYDROcodone-acetaminophen (NORCO/VICODIN) 5-325 MG tablet Take 1 tablet by mouth every 4 (four) hours as needed. 07/29/16   Janne Napoleon, NP  Insulin Pen Needle (B-D UF III MINI PEN NEEDLES) 31G X 5 MM MISC Use 3 per day to inject Insulin 02/11/16   Elayne Snare, MD  Insulin Syringe-Needle U-100 31G X 15/64" 0.5 ML MISC Use to inject insulin as isntructed 02/08/16   Lorella Nimrod, MD  INVOKANA 300 MG TABS tablet Take 1 tablet by mouth daily before breakfast 07/27/16   Elayne Snare, MD  LANTUS 100 UNIT/ML injection Inject 44 units below the skin at bedtime 04/07/16   Milagros Loll, MD  liraglutide (VICTOZA) 18 MG/3ML SOPN Inject 0.2 mLs (1.2 mg total) into the skin daily. 06/08/16   Milagros Loll, MD  lisinopril (PRINIVIL,ZESTRIL) 20 MG tablet TAKE 1 TABLET BY MOUTH EVERY DAY 06/24/16   Milagros Loll, MD  NOVOLOG FLEXPEN 100 UNIT/ML FlexPen Inject 14 units below the skin 3 times daily with meals 03/01/16   Elayne Snare, MD  sulfamethoxazole-trimethoprim (BACTRIM DS,SEPTRA DS) 800-160 MG tablet Take 1 tablet by mouth 2 (two) times daily. 07/25/16 08/04/16  Sherlene Shams, MD   Meds Ordered and  Administered this Visit  Medications - No data to display  BP 111/67 (BP Location: Right Arm)   Pulse 103   Temp 98.5 F (36.9 C) (Oral)   Resp 16   SpO2 99%  No data found.   Physical Exam  Constitutional: She is oriented to person, place, and time. She appears well-developed and well-nourished. No distress.  Neck: Neck supple.  Cardiovascular: Normal rate.   Pulmonary/Chest: Effort normal.  Abdominal: Soft. She exhibits no distension. There is no tenderness.  Neurological: She is alert and oriented to person, place, and time.  Skin: Skin is warm and dry.  The abscess to the left lower  quadrant is approximate the same size as described 5 days ago. It is quite indurated, very hard. Does not palpate is fluctuant. No overlying cellulitis. Positive for tenderness. No lymphangitis. There is a pinhole size fistula at the site of incision where a drop of pus exists.  Nursing note and vitals reviewed.   Urgent Care Course     .Marland KitchenIncision and Drainage Date/Time: 07/29/2016 3:04 PM Performed by: Marcha Dutton, Kahle Mcqueen Authorized by: Melynda Ripple   Consent:    Consent obtained:  Verbal   Consent given by:  Patient   Risks discussed:  Bleeding, pain and infection Location:    Type:  Abscess   Size:  12 x 7   Location:  Trunk   Trunk location:  Abdomen Pre-procedure details:    Skin preparation:  Betadine Anesthesia (see MAR for exact dosages):    Anesthesia method:  Local infiltration   Local anesthetic:  Lidocaine 2% w/o epi and bupivacaine 0.25% w/o epi Procedure type:    Complexity:  Complex Procedure details:    Incision types:  Single with marsupialization   Incision depth:  Subcutaneous   Scalpel blade:  11   Wound management:  Probed and deloculated   Drainage:  Purulent and bloody   Drainage amount:  Moderate   Wound treatment:  Drain placed   Packing materials:  1/2 in iodoform gauze Post-procedure details:    Patient tolerance of procedure:  Tolerated well, no  immediate complications Comments:     The more superficial levels of this abscess were firm and indurated, almost scarlike. Approximately 1-1/2-2 cm in depth obtained some pus and blood. Curved hemostats were placed within the wound which was 2 cm in length and performed marsupialization. This was rather easy and that was very little tissue within this cavity, And other words most of this central area was cavernous. A small amount of pus was obtained and once the cavity was opened further it was followed by blood. The cavity was irrigated with 20 cc saline and most of that was really captured with manipulation. The wound was then packed and dressed.    (including critical care time)  Labs Review Labs Reviewed  AEROBIC CULTURE (SUPERFICIAL SPECIMEN)    Imaging Review No results found.   Visual Acuity Review  Right Eye Distance:   Left Eye Distance:   Bilateral Distance:    Right Eye Near:   Left Eye Near:    Bilateral Near:         MDM   1. Abscess of skin of abdomen    Keep the dressing on for the next 2 days until you come in to the urgent care. We need to check the healing process and remove the packing. It may be necessary to repack it depending on the progress of the wound healing. Keep warm compresses over the wound. If he started to develop fevers, chills, nausea or vomiting or getting sick in the next 2 or 3 days your need to go to the emergency department if this may indicate a more serious infection. Meds ordered this encounter  Medications  . HYDROcodone-acetaminophen (NORCO/VICODIN) 5-325 MG tablet    Sig: Take 1 tablet by mouth every 4 (four) hours as needed.    Dispense:  15 tablet    Refill:  0    Order Specific Question:   Supervising Provider    Answer:   Melynda Ripple [[id number]]   Culture pending. The patient is aware if there is not any improvement in a couple  days when returning that she may have to be sent to the emergency department. Hopefully by  opening the wound a little deeper that there will be greater improvement. Consulted with Dr. Valere Dross prior to attempting the procedure.    Janne Napoleon, NP 07/29/16 1511

## 2016-07-29 NOTE — Discharge Instructions (Signed)
Keep the dressing on for the next 2 days until you come in to the urgent care. We need to check the healing process and remove the packing. It may be necessary to repack it depending on the progress of the wound healing. Keep warm compresses over the wound. If he started to develop fevers, chills, nausea or vomiting or getting sick in the next 2 or 3 days your need to go to the emergency department if this may indicate a more serious infection.

## 2016-07-31 ENCOUNTER — Encounter (HOSPITAL_COMMUNITY): Payer: Self-pay | Admitting: *Deleted

## 2016-07-31 ENCOUNTER — Ambulatory Visit (HOSPITAL_COMMUNITY)
Admission: EM | Admit: 2016-07-31 | Discharge: 2016-07-31 | Disposition: A | Discharge status: Home / Self Care | Payer: Medicare Other | Attending: Family Medicine | Admitting: Family Medicine

## 2016-07-31 DIAGNOSIS — Z5189 Encounter for other specified aftercare: Secondary | ICD-10-CM | POA: Diagnosis not present

## 2016-07-31 MED ORDER — HYDROCODONE-ACETAMINOPHEN 5-325 MG PO TABS: 1.0000 | ORAL_TABLET | Freq: Four times a day (QID) | ORAL | 0 refills | Status: DC | PRN

## 2016-07-31 NOTE — Discharge Instructions (Signed)
OK to wash. Use soap and water. Do not vigorously scrub.  Look out for spreading redness, fever, foul odor, or any worsening symptoms.   Use ibuprofen for pain. If that is not helping, OK to use Norco. Ice over the area may also be helpful.  Continue taking antibiotic until you have none left.

## 2016-07-31 NOTE — ED Provider Notes (Signed)
[name]    CSN: [id number] Arrival date & time: 07/31/16  1503     History   Chief Complaint Chief Complaint  Patient presents with  . Wound Check    HPI [name] [name] is a 41 y.o. female.   HPI The patient is here for a wound check. She had an incision and drainage performed 2 days ago. It was packed with iodoform gauze and she was recommended to return in 2 days for follow-up. She states that she's not remove her dressing. She has not noticed any fevers, worsening pain, or drainage through her dressing.  Past Medical History:  Diagnosis Date  . Abnormal Pap smear of cervix 2009  . Abscess    history of multiple abscesses  . Anemia of chronic disease 2002  . Anxiety   . Bipolar disorder ([place])   . Cellulitis 05/21/2014   right eye  . Chronic bronchitis ([place])    "get it q yr" (05/13/2013)  . Chronic pain   . Depression   . Edema of lower extremity   . Endocarditis 2002   subacute bacterial endocarditis.   . Family history of anesthesia complication    "my mom has a hard time coming out from under"  . GERD (gastroesophageal reflux disease)   . Heart murmur   . History of blood transfusion    "just low blood count" (05/13/2013)  . Hyperlipidemia   . Hypertension   . Hypothyroidism   . Hypothyroidism, adult 03/21/2014  . Obesity   . OSA on CPAP   . Peripheral neuropathy ([place])   . Type II diabetes mellitus [facility])     Patient Active Problem List   Diagnosis Date Noted  . Lightheadedness 07/15/2016  . CKD (chronic kidney disease) stage 2, GFR 60-89 ml/min 07/15/2016  . Bilateral lower extremity edema 05/13/2016  . OSA on CPAP 05/13/2016  . Vaginal pruritus 03/14/2016  . Morbid obesity due to excess calories ([place]) 11/27/2015  . Callus of foot 11/26/2015  . History of Low serum cortisol level ([place]) 01/07/2015  . Financial difficulty 04/25/2013  . Microcytic anemia 08/24/2012  . Uncontrolled type 2 diabetes mellitus ([place]) 07/12/2012  .  Necrotizing fasciitis s/p OR debridements 07/06/2012  . Carpal tunnel syndrome, bilateral 01/25/2012  . Diabetic peripheral neuropathy ([place]) 07/04/2011  . Anxiety and depression 06/02/2011  . ABNORMAL PAP SMEAR, LGSIL 12/13/2007  . GERD 12/06/2007  . Hyperlipidemia 08/29/2007  . Essential hypertension 08/29/2007    Past Surgical History:  Procedure Laterality Date  . INCISION AND DRAINAGE ABSCESS     multiple I&Ds  . INCISION AND DRAINAGE ABSCESS Left 07/09/2012   Procedure: DRESSING CHANGE, THIGH WOUND;  Surgeon: Harl Bowie, MD;  Location: [place];  Service: General;  Laterality: Left;  . INCISION AND DRAINAGE OF WOUND Left 07/07/2012   Procedure: IRRIGATION AND DEBRIDEMENT WOUND;  Surgeon: Harl Bowie, MD;  Location: [place];  Service: General;  Laterality: Left;  . INCISION AND DRAINAGE PERIRECTAL ABSCESS Left 07/14/2012   Procedure: DEBRIDEMENT OF SKIN & SOFT TISSUE; DRESSING CHANGE UNDER ANESTHESIA;  Surgeon: Gayland Curry, MD,FACS;  Location: [facility];  Service: General;  Laterality: Left;  . INCISION AND DRAINAGE PERIRECTAL ABSCESS Left 07/16/2012   Procedure: I&D Left Thigh;  Surgeon: Gwenyth Ober, MD;  Location: [place];  Service: General;  Laterality: Left;  . INCISION AND DRAINAGE PERIRECTAL ABSCESS N/A 01/05/2015   Procedure: IRRIGATION AND DEBRIDEMENT PERIRECTAL ABSCESS;  Surgeon: Donnie Mesa, MD;  Location: [facility]  OR;  Service: General;  Laterality: N/A;  . IRRIGATION AND DEBRIDEMENT ABSCESS Left 07/06/2012   Procedure: IRRIGATION AND DEBRIDEMENT ABSCESS BUTTOCKS AND THIGH;  Surgeon: Shann Medal, MD;  Location: [place];  Service: General;  Laterality: Left;  . IRRIGATION AND DEBRIDEMENT ABSCESS Left 08/10/2012   Procedure: IRRIGATION AND DEBRIDEMENT ABSCESS;  Surgeon: Madilyn Hook, DO;  Location: [place];  Service: General;  Laterality: Left;  . TONSILLECTOMY  1994    Home Medications    Prior to Admission medications   Medication Sig Start Date End Date Taking?  Authorizing Provider  ACCU-CHEK FASTCLIX LANCETS MISC Check blood sugar 3 times a day before meals 08/20/15  Yes Milagros Loll, MD  amitriptyline (ELAVIL) 25 MG tablet Take 2 tablets (50 mg total) by mouth at bedtime. 07/14/16  Yes Milagros Loll, MD  atorvastatin (LIPITOR) 20 MG tablet Take 1 tablet (20 mg total) by mouth daily. 02/08/16 02/07/17 Yes Lorella Nimrod, MD  Blood Glucose Monitoring Suppl (ACCU-CHEK AVIVA PLUS) w/Device KIT 1 each by Does not apply route 3 (three) times daily. 03/16/16  Yes Elayne Snare, MD  cephALEXin (KEFLEX) 500 MG capsule Take 1 capsule (500 mg total) by mouth 2 (two) times daily. 07/25/16  Yes Sherlene Shams, MD  furosemide (LASIX) 20 MG tablet Take 1 tablet (20 mg total) by mouth daily as needed for edema. 07/14/16  Yes Milagros Loll, MD  glucose blood (ACCU-CHEK AVIVA PLUS) test strip Check blood sugar 3 times a day before meals 03/16/16  Yes Elayne Snare, MD  Insulin Pen Needle (B-D UF III MINI PEN NEEDLES) 31G X 5 MM MISC Use 3 per day to inject Insulin 02/11/16  Yes Elayne Snare, MD  Insulin Syringe-Needle U-100 31G X 15/64" 0.5 ML MISC Use to inject insulin as isntructed 02/08/16  Yes Lorella Nimrod, MD  INVOKANA 300 MG TABS tablet Take 1 tablet by mouth daily before breakfast 07/27/16  Yes Elayne Snare, MD  LANTUS 100 UNIT/ML injection Inject 44 units below the skin at bedtime 04/07/16  Yes Milagros Loll, MD  liraglutide (VICTOZA) 18 MG/3ML SOPN Inject 0.2 mLs (1.2 mg total) into the skin daily. 06/08/16  Yes Milagros Loll, MD  lisinopril (PRINIVIL,ZESTRIL) 20 MG tablet TAKE 1 TABLET BY MOUTH EVERY DAY 06/24/16  Yes Milagros Loll, MD  NOVOLOG FLEXPEN 100 UNIT/ML FlexPen Inject 14 units below the skin 3 times daily with meals 03/01/16  Yes Elayne Snare, MD  sulfamethoxazole-trimethoprim (BACTRIM DS,SEPTRA DS) 800-160 MG tablet Take 1 tablet by mouth 2 (two) times daily. 07/25/16 08/04/16 Yes Sherlene Shams, MD  HYDROcodone-acetaminophen (NORCO/VICODIN) 5-325 MG tablet Take  1 tablet by mouth every 6 (six) hours as needed for moderate pain or severe pain. 07/31/16   Shelda Pal, DO    Family History Family History  Problem Relation Age of Onset  . Heart failure Mother   . Diabetes Mother   . Kidney disease Mother   . Kidney disease Father   . Diabetes Paternal Grandmother   . Heart failure Paternal Grandmother     Social History Social History  Substance Use Topics  . Smoking status: Current Every Day Smoker    Packs/day: 0.50    Years: 6.00    Types: Cigarettes  . Smokeless tobacco: Never Used  . Alcohol use 0.0 oz/week     Comment: Socially.     Allergies   Vancomycin   Review of Systems Review of Systems  Skin: Positive for wound.  Physical Exam Triage Vital Signs ED Triage Vitals  Enc Vitals Group     BP 07/31/16 1523 126/63     Pulse Rate 07/31/16 1521 89     Resp 07/31/16 1521 18     Temp 07/31/16 1521 98.3 F (36.8 C)     Temp Source 07/31/16 1521 Oral     SpO2 07/31/16 1521 96 %   Updated Vital Signs BP 126/63   Pulse 89   Temp 98.3 F (36.8 C) (Oral)   Resp 18   LMP 07/06/2016 (Approximate)   SpO2 96%   Physical Exam  Constitutional: She appears well-developed and well-nourished.  HENT:  Head: Normocephalic and atraumatic.  Psychiatric: She has a normal mood and affect. Judgment normal.  Skin: LL abdominal wall, there is a large wad of dressing covering a small incision with iodoform gauze coming out. It is indurated and without fluctuance. There is no erythema. It is moderately TTP. I am unable to express any material, but there is a mix of blood and purulent material on the old dressing.    UC Treatments / Results  Procedures Procedures - none  Initial Impression / Assessment and Plan / UC Course  I have reviewed the triage vital signs and the nursing notes.  Pertinent labs & imaging results that were available during my care of the patient were reviewed by me and considered in my medical  decision making (see chart for details).     41 yo Female presents for a wound check following incision and drainage. I do not appreciate any signs of infection at this time. She is currently taking antibiotic, which I recommended she complete the course of. I would like her to follow-up with her PCP this Friday to have her packing removed and see if any further evaluation/treatment is required. Warning signs/symptoms such as fever, foul order, spreading redness, or overall worsening symptoms provided in AVS and verbally. Will give a short supply of Norco for breakthrough pain, however recommended she try icing and ibuprofen before taking. The patient voiced understanding and agreement to the plan.  Final Clinical Impressions(s) / UC Diagnoses   Final diagnoses:  Visit for wound check    New Prescriptions Discharge Medication List as of 07/31/2016  3:43 PM       Shelda Pal, DO 07/31/16 1638

## 2016-07-31 NOTE — ED Triage Notes (Signed)
Pt present for f/u left abd abscess.  States no change in sxs.  Packing in place.

## 2016-08-01 ENCOUNTER — Telehealth (HOSPITAL_COMMUNITY): Payer: Self-pay | Admitting: Internal Medicine

## 2016-08-01 LAB — AEROBIC CULTURE W GRAM STAIN (SUPERFICIAL SPECIMEN)

## 2016-08-01 LAB — AEROBIC CULTURE  (SUPERFICIAL SPECIMEN)

## 2016-08-01 NOTE — Telephone Encounter (Signed)
Clinical staff, please let patient know that I have referred her to [facility] for Infectious Disease.  Her abdominal wall abscess has grown an unusual germ, Eikenella.  A specialist's opinion about treatment of this germ is often helpful in getting it resolved.   The patient's PCP is Jacques Earthly.  I have copied her on this communication.   Jodi Mourning MD

## 2016-08-04 ENCOUNTER — Telehealth: Payer: Self-pay | Admitting: Pulmonary Disease

## 2016-08-04 NOTE — Telephone Encounter (Signed)
APT. REMINDER CALL, LMTCB °

## 2016-08-05 ENCOUNTER — Ambulatory Visit (INDEPENDENT_AMBULATORY_CARE_PROVIDER_SITE_OTHER): Payer: Medicare Other | Admitting: Internal Medicine

## 2016-08-05 VITALS — BP 122/75 | HR 98 | Temp 98.0°F | Wt 334.9 lb

## 2016-08-05 DIAGNOSIS — Z9889 Other specified postprocedural states: Secondary | ICD-10-CM | POA: Diagnosis not present

## 2016-08-05 DIAGNOSIS — B9689 Other specified bacterial agents as the cause of diseases classified elsewhere: Secondary | ICD-10-CM

## 2016-08-05 DIAGNOSIS — IMO0002 Reserved for concepts with insufficient information to code with codable children: Secondary | ICD-10-CM

## 2016-08-05 DIAGNOSIS — F17210 Nicotine dependence, cigarettes, uncomplicated: Secondary | ICD-10-CM

## 2016-08-05 DIAGNOSIS — R42 Dizziness and giddiness: Secondary | ICD-10-CM

## 2016-08-05 DIAGNOSIS — L0291 Cutaneous abscess, unspecified: Secondary | ICD-10-CM | POA: Insufficient documentation

## 2016-08-05 DIAGNOSIS — E1142 Type 2 diabetes mellitus with diabetic polyneuropathy: Secondary | ICD-10-CM

## 2016-08-05 DIAGNOSIS — L02211 Cutaneous abscess of abdominal wall: Secondary | ICD-10-CM

## 2016-08-05 DIAGNOSIS — E1165 Type 2 diabetes mellitus with hyperglycemia: Secondary | ICD-10-CM | POA: Diagnosis not present

## 2016-08-05 DIAGNOSIS — Z794 Long term (current) use of insulin: Secondary | ICD-10-CM | POA: Diagnosis not present

## 2016-08-05 DIAGNOSIS — Z5189 Encounter for other specified aftercare: Secondary | ICD-10-CM

## 2016-08-05 LAB — POCT GLYCOSYLATED HEMOGLOBIN (HGB A1C): Hemoglobin A1C: 8.1

## 2016-08-05 LAB — GLUCOSE, CAPILLARY: Glucose-Capillary: 90 mg/dL (ref 65–99)

## 2016-08-05 NOTE — Patient Instructions (Addendum)
[name] [name],  It was a pleasure to see you today in clinic. Your abscess (boil) appears to be healing nicely. Please finish the course of antibiotics that were previously prescribed. You may remove the packing that we placed today next Wednesday. If you do not feel comfortable doing it yourself feel free to make an appointment and come back in. It will likely not need to be packed again after that. I will call you with the results of your A1C. In the meantime, continue to take your diabetes medication as previously prescribed. I have referred you to physical therapy for your dizziness. They will call you to schedule an appointment. If you have any questions or concerns, call our clinic at [phone] or after hours call [phone] and ask for the internal medicine resident on call. Thank you!

## 2016-08-05 NOTE — Progress Notes (Signed)
   CC: DM follow up, abscess follow up   HPI:  [name] is a 41 y.o. F with past medical history outlined below here for DM and abscess follow up. She was last seen in clinic on 07/15/2016 at which point patient was complaining of recurrent episodes of dizziness. There was some concern that her dizziness may be due to hypoglycemia and her lantus was decreased from 44 -> 40 units QHS. Today, patient says she continues to experience transient episodes of dizziness that last for a few seconds and resolve spontaneously. She cannot identify a trigger or any positional changes that illicits the dizziness. She recently checked her blood glucose during one of these episodes and said her sugar was in the 190s. She is prescribed Invokana 300 mg QD, Victoza 1.2 mg, and Novolog 15 unit TID with meals, however patients says she has been scared of having hypoglycemic events with her dizziness and has only been taking 10 units TID with meals.  She was seen in the ED on 07/25/2016 for a superficial skin abscess on her left lower abdomen. The abscess was drained and packed in the ED and she has been taking Keflex and bactrim prescribed by the EDP. She followed up twice in the ED since the initial visit to have the abscess re-packed.   For the details of today's visit please refer to the assessment and plan.  Past Medical History:  Diagnosis Date  . Abnormal Pap smear of cervix 2009  . Abscess    history of multiple abscesses  . Anemia of chronic disease 2002  . Anxiety   . Bipolar disorder ([place])   . Cellulitis 05/21/2014   right eye  . Chronic bronchitis ([place])    "get it q yr" (05/13/2013)  . Chronic pain   . Depression   . Edema of lower extremity   . Endocarditis 2002   subacute bacterial endocarditis.   . Family history of anesthesia complication    "my mom has a hard time coming out from under"  . GERD (gastroesophageal reflux disease)   . Heart murmur   . History of blood transfusion      "just low blood count" (05/13/2013)  . Hyperlipidemia   . Hypertension   . Hypothyroidism   . Hypothyroidism, adult 03/21/2014  . Obesity   . OSA on CPAP   . Peripheral neuropathy ([place])   . Type II diabetes mellitus ([place])     Review of Systems:  All pertinents listed in HPI, otherwise negative  Physical Exam:  Vitals:   08/05/16 1435  BP: 122/75  Pulse: 98  Temp: 98 F (36.7 C)  TempSrc: Oral  SpO2: 96%  Weight: (!) 334 lb 14.4 oz (151.9 kg)    Constitutional: NAD, appears comfortable Cardiovascular: RRR Pulmonary/Chest: CTAB Abdominal: Soft, non tender, non distended. +BS. ~ 3x4 cm indurated and erythematous area with central incision about 1 cm deep. Packing was removed and very minimal purulent sanguinous fluid expressed. Overall the area seems to be healing nicely compared to prior descriptions. Surrounding area was cleaned and incision was repacked.  Extremities: Warm and well perfused.  No edema.   Assessment & Plan:   See Encounters Tab for problem based charting.  Patient seen with Dr. Evette Doffing

## 2016-08-06 NOTE — Assessment & Plan Note (Signed)
Lantus was decreased at her last visit 44 --> 40 units QHS due to concern for hypoglycemia with her dizzy episodes. She has also decreased her TID novolog from 15 -> 10 units with meals because of this concern. Patient reports that she has checked her blood glucose while having a dizzy episode and her CBG was in the 190s. I do not feel that her symptoms are due to hypoglycemia, rather, are more consistent with vertigo (please see vertigo A/P). Instructed patient to continue taking her medications as prescribed for now. Will follow up A1C.  -- Continue Invokana 300 mg QD -- Continue Victoza 1.2 mg -- Continue Lantus 40 U QHS -- Continue Novolog 15 TID with meals   UPDATE: A1C is 8.1 from 7.2 three months ago. Will call patient and have her increase her Lantus back to 44 units QHS.

## 2016-08-06 NOTE — Assessment & Plan Note (Signed)
Patient endorses symptoms of dizziness that come on at random and last for a few seconds at a time. Symptoms resolve spontaneously without intervention. She is unable to identify a trigger or any positional changes that illicit her symptoms. Her description of transient dizziness that resolves after a few seconds is consistent with BPPV. Will refer to vestibular rehab for evaluation and treatment.  -- Vestibular rehab

## 2016-08-06 NOTE — Assessment & Plan Note (Addendum)
Patient is here for abscess follow up. Abscess was drained and packed in the ED on 07/25/2016 and appears to be healing nicely. She has been taking keflex and bactrim prescribed by the EDP. Abscess was repacked today in clinic. Instructed patient to complete her antibiotic course and to leave packing in place until Wednesday of next week. She may then remove the packing herself or make a second appointment if she does not feel comfortable doing it herself. Abscess will likely not need to be repacked at that time. She reports a history of recurrent skin abscesses but it is unclear if she has a history of hidradenitis. Today we encouraged smoking cessation and better glucose control to help prevent future abscesses from forming.  -- Removing packing 08/10/16  -- Complete antibiotic course -- Return if no improvement or clinically worsening  -- Smoking cessation

## 2016-08-08 ENCOUNTER — Other Ambulatory Visit: Payer: Self-pay | Admitting: Pulmonary Disease

## 2016-08-08 DIAGNOSIS — I10 Essential (primary) hypertension: Secondary | ICD-10-CM

## 2016-08-08 NOTE — Telephone Encounter (Addendum)
For Lasix, I just wrote for 30 tablets and 1 refill 3 weeks ago. Also for the lisinopril-HCTZ, she is supposed to be on lisinopril by itself.

## 2016-08-09 NOTE — Progress Notes (Signed)
Internal Medicine Clinic Attending  I saw and evaluated the patient.  I personally confirmed the key portions of the history and exam documented by Dr. Guilloud and I reviewed pertinent patient test results.  The assessment, diagnosis, and plan were formulated together and I agree with the documentation in the resident's note.  

## 2016-08-11 ENCOUNTER — Ambulatory Visit: Payer: Self-pay

## 2016-08-24 ENCOUNTER — Ambulatory Visit: Payer: Self-pay | Admitting: Infectious Diseases

## 2016-09-02 ENCOUNTER — Ambulatory Visit (INDEPENDENT_AMBULATORY_CARE_PROVIDER_SITE_OTHER): Payer: Medicare Other | Admitting: Neurology

## 2016-09-02 ENCOUNTER — Encounter: Payer: Self-pay | Admitting: Neurology

## 2016-09-02 VITALS — BP 140/90 | HR 97 | Ht 70.0 in | Wt 335.2 lb

## 2016-09-02 DIAGNOSIS — Z720 Tobacco use: Secondary | ICD-10-CM

## 2016-09-02 DIAGNOSIS — E1142 Type 2 diabetes mellitus with diabetic polyneuropathy: Secondary | ICD-10-CM | POA: Diagnosis not present

## 2016-09-02 NOTE — Progress Notes (Signed)
[facility] Clinic Note - Initial Visit   Date: 09/02/16  [name] [name] MRN: [id number] DOB: Oct 01, 1975   Dear Dr. Randell Patient:  Thank you for your kind referral of [facility] for consultation of neuropathy. Although her history is well known to you, please allow [place] to reiterate it for the purpose of our medical record. The patient was accompanied to the clinic by self.    History of Present Illness: [name] [name] is a 41 y.o. right-handed [place] American female with bipolar disorder, depression, hypertension, hyperlipidemia, hypothyroidism, chronic pain, diabetes mellitus, and OSA on CPAP presenting for evaluation of neuropathic pain.    She was diagnosed with diabetes age the age of 41 and has always had very poorly-controlled diabets with HbA1c ranging in 12-15 due to medication noncompliance.  In 2017, she was started on Invokana and reports that her blood sugars are doing better after starting this and being much more complaint with medications.  She has several complications of diabetes including peripheral neuropathy, nephropathy and retinopathy.  Around 2016, she started having numbness and sharp stabbing pain of the legs.  Over the years, the pain started to involve her "whole body".  She also has weakness of the right hand. She used to be an avid drawer and braid hair, but she is unable to do this because her hands are weak, cramp, and has numbness/tingling of the hands.  She has started to depend more on her left hand for fine motor movements, but even the left hand feels "thick".  She also complains of whole-body achy sore pain, as if something is standing on her shoulders.  She often lays in her bed because of her pain.  Her mood has been very down because she feels that there are no options for her to get better. She has got daughter whom she cares for and feels her to be her inspiration to be better about her health.  She has  previously tried gabapentin '300mg'$  TID (did not take higher dose, denies side effects), Lyrica (reports taking it once per day), and Cymbalta (could not swallow pill).  She is currently on amitriptyline '50mg'$  at bedtime.  She denies any improvement with respect to nerve pain, but it helps her sleep.    Out-side paper records, electronic medical record, and images have been reviewed where available and summarized as:  Lab Results  Component Value Date   HGBA1C 8.1 08/05/2016   Lab Results  Component Value Date   PPIRJJOA41 660 07/09/2014   Lab Results  Component Value Date   TSH 2.370 08/13/2015      Past Medical History:  Diagnosis Date  . Abnormal Pap smear of cervix 2009  . Abscess    history of multiple abscesses  . Anemia of chronic disease 2002  . Anxiety   . Bipolar disorder ([place])   . Cellulitis 05/21/2014   right eye  . Chronic bronchitis ([place])    "get it q yr" (05/13/2013)  . Chronic pain   . Depression   . Edema of lower extremity   . Endocarditis 2002   subacute bacterial endocarditis.   . Family history of anesthesia complication    "my mom has a hard time coming out from under"  . GERD (gastroesophageal reflux disease)   . Heart murmur   . History of blood transfusion    "just low blood count" (05/13/2013)  . Hyperlipidemia   . Hypertension   . Hypothyroidism   . Hypothyroidism, adult  03/21/2014  . Obesity   . OSA on CPAP   . Peripheral neuropathy ([place])   . Type II diabetes mellitus ([facility])     Past Surgical History:  Procedure Laterality Date  . INCISION AND DRAINAGE ABSCESS     multiple I&Ds  . INCISION AND DRAINAGE ABSCESS Left 07/09/2012   Procedure: DRESSING CHANGE, THIGH WOUND;  Surgeon: Harl Bowie, MD;  Location: [facility];  Service: General;  Laterality: Left;  . INCISION AND DRAINAGE OF WOUND Left 07/07/2012   Procedure: IRRIGATION AND DEBRIDEMENT WOUND;  Surgeon: Harl Bowie, MD;  Location: [facility];  Service: General;  Laterality:  Left;  . INCISION AND DRAINAGE PERIRECTAL ABSCESS Left 07/14/2012   Procedure: DEBRIDEMENT OF SKIN & SOFT TISSUE; DRESSING CHANGE UNDER ANESTHESIA;  Surgeon: Gayland Curry, MD,FACS;  Location: [facility];  Service: General;  Laterality: Left;  . INCISION AND DRAINAGE PERIRECTAL ABSCESS Left 07/16/2012   Procedure: I&D Left Thigh;  Surgeon: Gwenyth Ober, MD;  Location: [facility];  Service: General;  Laterality: Left;  . INCISION AND DRAINAGE PERIRECTAL ABSCESS N/A 01/05/2015   Procedure: IRRIGATION AND DEBRIDEMENT PERIRECTAL ABSCESS;  Surgeon: Donnie Mesa, MD;  Location: [facility];  Service: General;  Laterality: N/A;  . IRRIGATION AND DEBRIDEMENT ABSCESS Left 07/06/2012   Procedure: IRRIGATION AND DEBRIDEMENT ABSCESS BUTTOCKS AND THIGH;  Surgeon: Shann Medal, MD;  Location: [facility];  Service: General;  Laterality: Left;  . IRRIGATION AND DEBRIDEMENT ABSCESS Left 08/10/2012   Procedure: IRRIGATION AND DEBRIDEMENT ABSCESS;  Surgeon: Madilyn Hook, DO;  Location: [facility];  Service: General;  Laterality: Left;  . TONSILLECTOMY  1994     Medications:  Outpatient Encounter Prescriptions as of 09/02/2016  Medication Sig  . ACCU-CHEK FASTCLIX LANCETS MISC Check blood sugar 3 times a day before meals  . amitriptyline (ELAVIL) 25 MG tablet Take 2 tablets (50 mg total) by mouth at bedtime.  Marland Kitchen atorvastatin (LIPITOR) 20 MG tablet Take 1 tablet (20 mg total) by mouth daily.  . Blood Glucose Monitoring Suppl (ACCU-CHEK AVIVA PLUS) w/Device KIT 1 each by Does not apply route 3 (three) times daily.  . cephALEXin (KEFLEX) 500 MG capsule Take 1 capsule (500 mg total) by mouth 2 (two) times daily.  . furosemide (LASIX) 20 MG tablet Take 1 tablet (20 mg total) by mouth daily as needed for edema.  Marland Kitchen glucose blood (ACCU-CHEK AVIVA PLUS) test strip Check blood sugar 3 times a day before meals  . Insulin Pen Needle (B-D UF III MINI PEN NEEDLES) 31G X 5 MM MISC Use 3 per day to inject Insulin  . Insulin Syringe-Needle U-100 31G X  15/64" 0.5 ML MISC Use to inject insulin as isntructed  . INVOKANA 300 MG TABS tablet Take 1 tablet by mouth daily before breakfast  . LANTUS 100 UNIT/ML injection Inject 44 units below the skin at bedtime  . liraglutide (VICTOZA) 18 MG/3ML SOPN Inject 0.2 mLs (1.2 mg total) into the skin daily.  Marland Kitchen lisinopril (PRINIVIL,ZESTRIL) 20 MG tablet TAKE 1 TABLET BY MOUTH EVERY DAY  . NOVOLOG FLEXPEN 100 UNIT/ML FlexPen Inject 14 units below the skin 3 times daily with meals  . HYDROcodone-acetaminophen (NORCO/VICODIN) 5-325 MG tablet Take 1 tablet by mouth every 6 (six) hours as needed for moderate pain or severe pain. (Patient not taking: Reported on 09/02/2016)   No facility-administered encounter medications on file as of 09/02/2016.     Allergies:  Allergies  Allergen Reactions  . Vancomycin Other (See Comments)  Acute renal failure suspected secondary to vanco    Family History: Family History  Problem Relation Age of Onset  . Heart failure Mother   . Diabetes Mother   . Kidney disease Mother   . Kidney disease Father   . Diabetes Paternal Grandmother   . Heart failure Paternal Grandmother     Social History: Social History  Substance Use Topics  . Smoking status: Current Every Day Smoker    Packs/day: 1.00    Years: 6.00    Types: Cigarettes  . Smokeless tobacco: Never Used  . Alcohol use 0.0 oz/week     Comment: Socially.   Social History   Social History Narrative   Lives with mother currently, goddaughter and uncle in a one story home.     On disability for diabetes, neuropathy, sleep apnea etc.  Disability since 2014.    Education: 11th grade.     Review of Systems:  CONSTITUTIONAL: No fevers, chills, night sweats, or weight loss.   EYES: No visual changes or eye pain ENT: No hearing changes.  No history of nose bleeds.   RESPIRATORY: No cough, wheezing and shortness of breath.   CARDIOVASCULAR: Negative for chest pain, and palpitations.   GI: Negative for  abdominal discomfort, blood in stools or black stools.  No recent change in bowel habits.   GU:  No history of incontinence.   MUSCLOSKELETAL: +history of joint pain or swelling.  +myalgias.   SKIN: + lesions, rash, and itching.   HEMATOLOGY/ONCOLOGY: Negative for prolonged bleeding, bruising easily, and swollen nodes.  No history of cancer.   ENDOCRINE: Negative for cold or heat intolerance, polydipsia or goiter.   PSYCH:  +depression or anxiety symptoms.   NEURO: As Above.   Vital Signs:  BP 140/90   Pulse 97   Ht '5\' 10"'$  (1.778 m)   Wt (!) 335 lb 4 oz (152.1 kg)   SpO2 98%   BMI 48.10 kg/m    General Medical Exam:   General:  Well appearing, obese, comfortable.   Eyes/ENT: see cranial nerve examination.   Neck: No masses appreciated.  Full range of motion without tenderness.  No carotid bruits. Respiratory:  Clear to auscultation, good air entry bilaterally.   Cardiac:  Regular rate and rhythm, no murmur.   Extremities:  No deformities, edema, or skin discoloration.  Skin:  Several area of healing ulceration over the right MCP and left distal middle finger.  Neurological Exam: MENTAL STATUS including orientation to time, place, person, recent and remote memory, attention span and concentration, language, and fund of knowledge is normal.  Speech is not dysarthric.  CRANIAL NERVES: II:  No visual field defects.  Unremarkable fundi.   III-IV-VI: Pupils equal round and reactive to light.  Normal conjugate, extra-ocular eye movements in all directions of gaze.  No nystagmus.  No ptosis.   V:  Normal facial sensation.     VII:  Normal facial symmetry and movements.  VIII:  Normal hearing and vestibular function.   IX-X:  Normal palatal movement.   XI:  Normal shoulder shrug and head rotation.   XII:  Normal tongue strength and range of motion, no deviation or fasciculation.  MOTOR:  Marked right ABP atrophy.  No fasciculations or abnormal movements.  No pronator drift.  Tone is  normal.    Right Upper Extremity:    Left Upper Extremity:    Deltoid  5/5   Deltoid  5/5   Biceps  5/5   Biceps  5/5   Triceps  5/5   Triceps  5/5   Wrist extensors  5/5   Wrist extensors  5/5   Wrist flexors  5/5   Wrist flexors  5/5   Finger extensors  5/5   Finger extensors  5/5   Finger flexors  5/5   Finger flexors  5/5   Dorsal interossei  4/5   Dorsal interossei  4/5   Abductor pollicis  2/5   Abductor pollicis  4/5   Tone (Ashworth scale)  0  Tone (Ashworth scale)  0   Right Lower Extremity:    Left Lower Extremity:    Hip flexors  5/5   Hip flexors  5/5   Hip extensors  5/5   Hip extensors  5/5   Knee flexors  5/5   Knee flexors  5/5   Knee extensors  5/5   Knee extensors  5/5   Dorsiflexors  5/5   Dorsiflexors  5/5   Plantarflexors  5/5   Plantarflexors  5/5   Toe extensors  5/5   Toe extensors  5/5   Toe flexors  5/5   Toe flexors  5/5   Tone (Ashworth scale)  0  Tone (Ashworth scale)  0   MSRs:  Right                                                                 Left brachioradialis 2+  brachioradialis 2+  biceps 2+  biceps 2+  triceps 2+  triceps 2+  patellar 1+  patellar 1+  ankle jerk 0  ankle jerk 0  Hoffman no  Hoffman no  plantar response down  plantar response down   SENSORY:  Vibration is reduced to 80% at the hands and knees, 50% at the ankles, and trace at the great toe bilaterally.  Temperature and pin prick is reduced from distal mid-calf.  Romberg's sign present.   COORDINATION/GAIT: Normal finger-to- nose-finger.  Finger tapping is slowed on the right. Gait is wide-based, stable.  IMPRESSION: 1.  Diabetic polyneuropathy affecting the hands and feet  - Long discussion regarding management options and the importance of her being complaint with diabetic medications  - In the past, she has been on subtherapeutic dose of gabapentin, Lyrica, and Cymbalta which were prematurely stopped by patient because she expected immediate pain relief.  I informed  her that these medication takes weeks to months for therepeutic benefits and the dose needs to be optimized for each patient.  She is open to starting Lyrica which will be started at '50mg'$  BID x 1 week, then '75mg'$  BID x 1 week.  She will call with update in 2 weeks and if tolerating, a prescription for Lyrica '100mg'$  BID will be sent  2. ?Superimposed right carpal tunnel syndrome given asymmetrical right APB atrophy and weakness  - NCS/EMG of the upper extremities to better determine whether her hand weakness is due to progression of neuropathy vs. CTS  3.  Tobacco cessation encouraged  4.  Morbid obesity.  Encouraged her to start water exercises  Return to clinic in 3 months.   The duration of this appointment visit was 60 minutes of face-to-face time with the patient.  Greater than 50% of this time was spent in counseling, explanation  of diagnosis, planning of further management, and coordination of care.   Thank you for allowing me to participate in patient's care.  If I can answer any additional questions, I would be pleased to do so.    Sincerely,    Glady Ouderkirk K. Posey Pronto, DO

## 2016-09-02 NOTE — Patient Instructions (Addendum)
1.  Start Lyrica as follows   AM  PM Week 1: 50mg   50mg  Week 2: 75mg   75mg    please call my office if you are tolerating the medication so we can send a prescription Week 3: 100mg   100mg    2.  NCS/EMG of the upper extremities  Return to clinic in 3 months

## 2016-09-06 ENCOUNTER — Telehealth: Payer: Self-pay | Admitting: Pulmonary Disease

## 2016-09-06 NOTE — Telephone Encounter (Signed)
Calling to confirm appointment for 09/07/16 at 2:15 lmtcb ° °

## 2016-09-07 ENCOUNTER — Encounter: Payer: Self-pay | Admitting: Pulmonary Disease

## 2016-09-07 ENCOUNTER — Other Ambulatory Visit: Payer: Self-pay | Admitting: Pulmonary Disease

## 2016-09-07 ENCOUNTER — Ambulatory Visit (INDEPENDENT_AMBULATORY_CARE_PROVIDER_SITE_OTHER): Payer: Medicare Other | Admitting: Pulmonary Disease

## 2016-09-07 ENCOUNTER — Other Ambulatory Visit (HOSPITAL_COMMUNITY)
Admission: RE | Admit: 2016-09-07 | Discharge: 2016-09-07 | Disposition: A | Discharge status: Home / Self Care | Payer: Medicare Other | Source: Ambulatory Visit | Attending: Internal Medicine | Admitting: Internal Medicine

## 2016-09-07 VITALS — BP 114/61 | HR 105 | Temp 97.8°F | Ht 70.0 in | Wt 332.9 lb

## 2016-09-07 DIAGNOSIS — Z872 Personal history of diseases of the skin and subcutaneous tissue: Secondary | ICD-10-CM

## 2016-09-07 DIAGNOSIS — E1142 Type 2 diabetes mellitus with diabetic polyneuropathy: Secondary | ICD-10-CM | POA: Diagnosis not present

## 2016-09-07 DIAGNOSIS — Z114 Encounter for screening for human immunodeficiency virus [HIV]: Secondary | ICD-10-CM | POA: Diagnosis not present

## 2016-09-07 DIAGNOSIS — E1165 Type 2 diabetes mellitus with hyperglycemia: Secondary | ICD-10-CM | POA: Diagnosis not present

## 2016-09-07 DIAGNOSIS — IMO0002 Reserved for concepts with insufficient information to code with codable children: Secondary | ICD-10-CM

## 2016-09-07 DIAGNOSIS — Z124 Encounter for screening for malignant neoplasm of cervix: Secondary | ICD-10-CM | POA: Insufficient documentation

## 2016-09-07 DIAGNOSIS — M7918 Myalgia, other site: Secondary | ICD-10-CM

## 2016-09-07 DIAGNOSIS — R1010 Upper abdominal pain, unspecified: Secondary | ICD-10-CM | POA: Diagnosis not present

## 2016-09-07 DIAGNOSIS — Z8742 Personal history of other diseases of the female genital tract: Secondary | ICD-10-CM | POA: Diagnosis not present

## 2016-09-07 DIAGNOSIS — F17210 Nicotine dependence, cigarettes, uncomplicated: Secondary | ICD-10-CM

## 2016-09-07 DIAGNOSIS — L0291 Cutaneous abscess, unspecified: Secondary | ICD-10-CM

## 2016-09-07 DIAGNOSIS — R600 Localized edema: Secondary | ICD-10-CM

## 2016-09-07 DIAGNOSIS — G8929 Other chronic pain: Secondary | ICD-10-CM

## 2016-09-07 DIAGNOSIS — M25561 Pain in right knee: Secondary | ICD-10-CM | POA: Diagnosis not present

## 2016-09-07 DIAGNOSIS — Z7251 High risk heterosexual behavior: Secondary | ICD-10-CM | POA: Diagnosis not present

## 2016-09-07 DIAGNOSIS — I10 Essential (primary) hypertension: Secondary | ICD-10-CM

## 2016-09-07 DIAGNOSIS — Z794 Long term (current) use of insulin: Secondary | ICD-10-CM

## 2016-09-07 DIAGNOSIS — Z79899 Other long term (current) drug therapy: Secondary | ICD-10-CM | POA: Diagnosis not present

## 2016-09-07 DIAGNOSIS — Z6841 Body Mass Index (BMI) 40.0 and over, adult: Secondary | ICD-10-CM | POA: Diagnosis not present

## 2016-09-07 MED ORDER — LIRAGLUTIDE 18 MG/3ML ~~LOC~~ SOPN: 1.8000 mg | PEN_INJECTOR | Freq: Every day | SUBCUTANEOUS | 3 refills | Status: DC

## 2016-09-07 MED ORDER — CYCLOBENZAPRINE HCL 5 MG PO TABS: 5.0000 mg | ORAL_TABLET | Freq: Three times a day (TID) | ORAL | 0 refills | Status: DC | PRN

## 2016-09-07 NOTE — Patient Instructions (Addendum)
Please increase your Victoza to 1.8 mg once daily.  Take 1-2 tablets of Flexeril up to three times a day as needed for muscle spasm.

## 2016-09-07 NOTE — Progress Notes (Signed)
   CC: peripheral neurpathy  HPI:  [name] is a 41 y.o. woman with history of hypertension, HLD, obesity, OSA, T2DM, GERD, Anxiety/Depression here for follow up of her peripheral neuropathy.  She saw neurology and is on a plan to increase her Lyrica. She is following this plan. Has had small improvements.  Having knee pain in right knee with walking and at rest. No redness or warmth. Comes and goes. Denies any swelling. Has been ongoing for the past couple of months. Had fallen on her knee around that time. None since.   Past Medical History:  Diagnosis Date  . Abnormal Pap smear of cervix 2009  . Abscess    history of multiple abscesses  . Anemia of chronic disease 2002  . Anxiety   . Bipolar disorder ([place])   . Cellulitis 05/21/2014   right eye  . Chronic bronchitis ([place])    "get it q yr" (05/13/2013)  . Chronic pain   . Depression   . Edema of lower extremity   . Endocarditis 2002   subacute bacterial endocarditis.   . Family history of anesthesia complication    "my mom has a hard time coming out from under"  . GERD (gastroesophageal reflux disease)   . Heart murmur   . History of blood transfusion    "just low blood count" (05/13/2013)  . Hyperlipidemia   . Hypertension   . Hypothyroidism   . Hypothyroidism, adult 03/21/2014  . Obesity   . OSA on CPAP   . Peripheral neuropathy   . Type II diabetes mellitus ([facility])     Review of Systems:   No fevers or chills No chest pain  Physical Exam:  Vitals:   09/07/16 1424  BP: 114/61  Pulse: (!) 105  Temp: 97.8 F (36.6 C)  TempSrc: Oral  SpO2: 100%  Weight: (!) 332 lb 14.4 oz (151 kg)  Height: 5\' 10"  (1.778 m)   General Apperance: NAD HEENT: Normocephalic, atraumatic, anicteric sclera Neck: Supple, trachea midline Lungs: Clear to auscultation bilaterally. No wheezes, rhonchi or rales. Breathing comfortably Heart: Regular rate and rhythm, no murmur/rub/gallop Abdomen: Soft, nontender,  nondistended, no rebound/guarding GU: No gross lesions, minimal thin white discharge Extremities: Warm and well perfused, 1+ edema  Skin: No rashes or lesions Neurologic: Alert and interactive. No gross deficits.  Assessment & Plan:   See Encounters Tab for problem based charting.  Patient discussed with Dr. Lynnae January

## 2016-09-08 ENCOUNTER — Other Ambulatory Visit: Payer: Self-pay | Admitting: Pulmonary Disease

## 2016-09-08 DIAGNOSIS — M7918 Myalgia, other site: Secondary | ICD-10-CM

## 2016-09-08 DIAGNOSIS — M25561 Pain in right knee: Secondary | ICD-10-CM

## 2016-09-08 DIAGNOSIS — G8929 Other chronic pain: Secondary | ICD-10-CM

## 2016-09-08 HISTORY — DX: Myalgia, other site: M79.18

## 2016-09-08 HISTORY — DX: Other chronic pain: G89.29

## 2016-09-08 LAB — MICROALBUMIN / CREATININE URINE RATIO
CREATININE, UR: 77.1 mg/dL
Microalb/Creat Ratio: 184.6 mg/g creat — ABNORMAL HIGH (ref 0.0–30.0)
Microalbumin, Urine: 142.3 ug/mL

## 2016-09-08 LAB — BMP8+ANION GAP
Anion Gap: 18 mmol/L (ref 10.0–18.0)
BUN / CREAT RATIO: 18 (ref 9–23)
BUN: 24 mg/dL (ref 6–24)
CO2: 23 mmol/L (ref 18–29)
Calcium: 10 mg/dL (ref 8.7–10.2)
Chloride: 97 mmol/L (ref 96–106)
Creatinine, Ser: 1.31 mg/dL — ABNORMAL HIGH (ref 0.57–1.00)
GFR calc non Af Amer: 51 mL/min/{1.73_m2} — ABNORMAL LOW (ref >59)
GFR, EST AFRICAN AMERICAN: 59 mL/min/{1.73_m2} — AB (ref >59)
GLUCOSE: 287 mg/dL — AB (ref 65–99)
Potassium: 4.5 mmol/L (ref 3.5–5.2)
Sodium: 138 mmol/L (ref 134–144)

## 2016-09-08 LAB — LIPID PANEL
CHOLESTEROL TOTAL: 198 mg/dL (ref 100–199)
Chol/HDL Ratio: 5 ratio — ABNORMAL HIGH (ref 0.0–4.4)
HDL: 40 mg/dL (ref >39)
Triglycerides: 423 mg/dL — ABNORMAL HIGH (ref 0–149)

## 2016-09-08 LAB — HIV ANTIBODY (ROUTINE TESTING W REFLEX): HIV Screen 4th Generation wRfx: NONREACTIVE

## 2016-09-08 LAB — RPR: RPR Ser Ql: NONREACTIVE

## 2016-09-08 MED ORDER — INSULIN PEN NEEDLE 31G X 5 MM MISC: 2 refills | Status: DC

## 2016-09-08 MED ORDER — LISINOPRIL 20 MG PO TABS: 20.0000 mg | ORAL_TABLET | Freq: Every day | ORAL | 3 refills | Status: DC

## 2016-09-08 MED ORDER — FUROSEMIDE 20 MG PO TABS: 20.0000 mg | ORAL_TABLET | Freq: Every day | ORAL | 1 refills | Status: DC | PRN

## 2016-09-08 NOTE — Assessment & Plan Note (Signed)
Still interested in weight loss options. Increased Victoza. Asked her to again look into bariatric surgery. May consider higher doses of liraglutide versus adding a different agent such as orlistat. Would need to review with her the side effects/risks of each to decide as well as make sure her insurance covers it.

## 2016-09-08 NOTE — Assessment & Plan Note (Addendum)
Assessment: Has room for improvement of her glucose control. Random glucose today in 200s though she was drinking soda during our visit.  Plan:  Increase Victoaza to 1.8mg  daily Continue Invokana 300mg , Lantus 44u QHS, Novolog 15u TID with meals. Recheck BMP and lipid panel  ADDENDUM: Cr increased. Will ask her to use Lasix only prn as she is currently using it daily. Also will need to continue to follow her cr to make sure it does not continue to increase.  3.9% 10-year risk of heart disease or stroke. Continue moderate intensity statin.  Updated patient via phone

## 2016-09-08 NOTE — Assessment & Plan Note (Signed)
Wound healing well after packing removed. No recurrence of abscess.

## 2016-09-08 NOTE — Assessment & Plan Note (Signed)
She has pain across the top of her abdomen when she bends over that feels like a cramp. No other exacerbating factors. Improves with rubbing her muscles. Will do a a trial of Flexeril. Will also work on losing weight.

## 2016-09-08 NOTE — Progress Notes (Signed)
Internal Medicine Clinic Attending  Case discussed with Dr. Krall soon after the resident saw the patient.  We reviewed the resident's history and exam and pertinent patient test results.  I agree with the assessment, diagnosis, and plan of care documented in the resident's note. 

## 2016-09-08 NOTE — Assessment & Plan Note (Signed)
Mild improvements. Still continuing to increase dosing of Lyrica.

## 2016-09-08 NOTE — Assessment & Plan Note (Addendum)
Pap smear done today. Denies any lesions, pelvic pain or abnormal vaginal discharge. Also screened for RPR and HIV as she has unprotected sex.

## 2016-09-08 NOTE — Assessment & Plan Note (Signed)
2 months of intermittent right knee pain. No signs or symptoms of infection or inflammation. Likely OA. Would benefit from weight loss. Ordered XR R knee.

## 2016-09-08 NOTE — Assessment & Plan Note (Signed)
Proteinuria improved on lisinopril.

## 2016-09-09 ENCOUNTER — Telehealth: Payer: Self-pay | Admitting: *Deleted

## 2016-09-09 LAB — CYTOLOGY - PAP
Diagnosis: NEGATIVE
HPV: NOT DETECTED

## 2016-09-09 MED ORDER — LIRAGLUTIDE 18 MG/3ML ~~LOC~~ SOPN: 1.8000 mg | PEN_INJECTOR | Freq: Every day | SUBCUTANEOUS | 3 refills | Status: DC

## 2016-09-09 NOTE — Telephone Encounter (Signed)
Changed.  Thanks.

## 2016-09-09 NOTE — Telephone Encounter (Signed)
Pharmacy calls and states pt needs more victoza so she does not run out early, should have been 75ml not 6, could you change the medlist?

## 2016-09-12 NOTE — Addendum Note (Signed)
Addended by: Hulan Fray on: 09/12/2016 08:43 PM   Modules accepted: Orders

## 2016-09-20 ENCOUNTER — Ambulatory Visit (INDEPENDENT_AMBULATORY_CARE_PROVIDER_SITE_OTHER): Payer: Medicare Other | Admitting: Neurology

## 2016-09-20 DIAGNOSIS — E1142 Type 2 diabetes mellitus with diabetic polyneuropathy: Secondary | ICD-10-CM | POA: Diagnosis not present

## 2016-09-20 NOTE — Procedures (Signed)
[facility]  [place], [address]  [place], [place] [address] Tel: [phone] Fax:  [phone] Test Date:  09/20/2016  Patient: [name] [name] DOB: [birth year] Physician: Narda Amber, DO  Sex: Female Height: 5\' 10"  Ref Phys: Narda Amber, DO  ID#: [id number] Temp: 36.9C Technician:    Patient Complaints: This is a 41 year-old female with insulin-dependent diabetes referred for evaluation of bilateral hand weakness and numbness.  NCV & EMG Findings: Extensive electrodiagnostic testing of the right upper extremity and additional studies of the left shows:  1. Bilateral median, ulnar, and right radial sensory response is absent. Left radial sensory response is markedly reduced(4.7 V).   2. Bilateral median motor responses are absent. Bilateral ulnar motor responses show markedly reduced amplitude and latency is prolonged at the left. Proximal motor response of the ulnar nerves was unable to be obtained. 3. Needle electrode examination shows chronic motor axon loss changes affecting the distal hand muscles and conform to a gradient pattern. Proximal arm muscles show normal motor unit configuration and recruitment pattern. There is no evidence of accompanied active denervation.  Impression: The electrophysiologic findings are most consistent with a severe and chronic sensorimotor polyneuropathy neuropathy, axon loss in type, affecting the upper extremities.   ___________________________ Narda Amber, DO    Nerve Conduction Studies Anti Sensory Summary Table   Site NR Peak (ms) Norm Peak (ms) P-T Amp (V) Norm P-T Amp  Left Median Anti Sensory (2nd Digit)  36.9C  Wrist NR  <3.4  >20  Right Median Anti Sensory (2nd Digit)  36.9C  Wrist NR  <3.4  >20  Left Radial Anti Sensory (Base 1st Digit)  36.9C  Wrist    2.4 <2.7 4.7 >18  Right Radial Anti Sensory (Base 1st Digit)  36.9C  Wrist NR  <2.7  >18  Left Ulnar Anti Sensory (5th Digit)  36.9C  Wrist NR   <3.1  >12  Right Ulnar Anti Sensory (5th Digit)  36.9C  Wrist NR  <3.1  >12   Motor Summary Table   Site NR Onset (ms) Norm Onset (ms) O-P Amp (mV) Norm O-P Amp Site1 Site2 Delta-0 (ms) Dist (cm) Vel (m/s) Norm Vel (m/s)  Left Median Motor (Abd Poll Brev)  36.9C  Wrist NR  <3.9  >6 Elbow Wrist  0.0  >50  Elbow NR            Right Median Motor (Abd Poll Brev)  36.9C  Wrist NR  <3.9  >6 Elbow Wrist  29.0  >50  Elbow NR            Left Ulnar Motor (Abd Dig Minimi)  36.9C  Wrist    4.7 <3.1 1.2 >7 B Elbow Wrist  0.0  >50  B Elbow NR     A Elbow B Elbow  0.0  >50  A Elbow NR            Right Ulnar Motor (Abd Dig Minimi)  36.9C  Wrist    2.9 <3.1 1.9 >7 B Elbow Wrist 6.6 26.0 39 >50  B Elbow    9.5  0.7  A Elbow B Elbow 2.0 10.0 50 >50  A Elbow    11.5  0.5          EMG   Side Muscle Ins Act Fibs Psw Fasc Number Recrt Dur Dur. Amp Amp. Poly Poly. Comment  Right 1stDorInt Nml Nml Nml Nml 3- Rapid Many 1+ Many 1+ Many 1+ ATR  Right  Abd Poll Brev Nml Nml Nml Nml SMU Rapid All 1+ All 1+ Nml Nml ATR  Right Ext Indicis Nml Nml Nml Nml 2- Rapid Some 1+ Some 1+ Nml Nml N/A  Right PronatorTeres Nml Nml Nml Nml Nml Nml Nml Nml Nml Nml Nml Nml N/A  Right Biceps Nml Nml Nml Nml Nml Nml Nml Nml Nml Nml Nml Nml N/A  Right Triceps Nml Nml Nml Nml Nml Nml Nml Nml Nml Nml Nml Nml N/A  Right Deltoid Nml Nml Nml Nml Nml Nml Nml Nml Nml Nml Nml Nml N/A  Right FlexPolLong Nml Nml Nml Nml 1- Rapid Some 1+ Some 1+ Nml Nml N/A  Left 1stDorInt Nml Nml Nml Nml 3- Rapid Most 1+ Most 1+ Most 1+ ATR  Left Abd Poll Brev Nml Nml Nml Nml SMU Rapid All 1+ All 1+ All 1+ ATR  Left FlexPolLong Nml Nml Nml Nml 2- Rapid Many 1+ Many 1+ Nml Nml N/A  Left Ext Indicis Nml Nml Nml Nml 2- Rapid Many 1+ Many 1+ Nml Nml N/A  Left PronatorTeres Nml Nml Nml Nml Nml Nml Nml Nml Nml Nml Nml Nml N/A  Left Biceps Nml Nml Nml Nml Nml Nml Nml Nml Nml Nml Nml Nml N/A  Left Triceps Nml Nml Nml Nml Nml Nml Nml Nml Nml Nml Nml Nml N/A   Left Deltoid Nml Nml Nml Nml Nml Nml Nml Nml Nml Nml Nml Nml N/A      Waveforms:

## 2016-09-26 ENCOUNTER — Other Ambulatory Visit: Payer: Self-pay | Admitting: Endocrinology

## 2016-09-26 ENCOUNTER — Encounter: Payer: Self-pay | Admitting: Pulmonary Disease

## 2016-09-26 NOTE — Progress Notes (Signed)
Pap smear normal. Repeat in 3 years. Results sent to patient.

## 2016-09-27 ENCOUNTER — Telehealth: Payer: Self-pay | Admitting: Neurology

## 2016-09-27 NOTE — Telephone Encounter (Signed)
Patient states that she [facility] 100mg 

## 2016-09-28 ENCOUNTER — Other Ambulatory Visit: Payer: Self-pay | Admitting: *Deleted

## 2016-09-28 MED ORDER — PREGABALIN 100 MG PO CAPS: 100.0000 mg | ORAL_CAPSULE | Freq: Two times a day (BID) | ORAL | 5 refills | Status: DC

## 2016-09-28 NOTE — Telephone Encounter (Signed)
Rx sent 

## 2016-10-07 ENCOUNTER — Other Ambulatory Visit: Payer: Self-pay | Admitting: Pulmonary Disease

## 2016-10-07 ENCOUNTER — Telehealth: Payer: Self-pay | Admitting: Endocrinology

## 2016-10-07 NOTE — Telephone Encounter (Signed)
Please advise if okay to refill Novolog. Patient has no showed last appointment and has not called to make a follow up appointment from October 2017. Please advise how to proceed. Thank you!

## 2016-10-07 NOTE — Telephone Encounter (Signed)
Refill x 1 mo but only if she makes an appt.

## 2016-10-07 NOTE — Telephone Encounter (Signed)
Called patient and spoke to patients mother. Patient is still currently taking Novolog and mother said she would give patient the message to call our office and make an appointment in order to get a refill of her Novolog.

## 2016-10-20 ENCOUNTER — Other Ambulatory Visit: Payer: Self-pay | Admitting: Pulmonary Disease

## 2016-10-20 ENCOUNTER — Other Ambulatory Visit: Payer: Self-pay | Admitting: Endocrinology

## 2016-11-03 ENCOUNTER — Encounter: Payer: Self-pay | Admitting: *Deleted

## 2016-11-04 ENCOUNTER — Other Ambulatory Visit: Payer: Self-pay | Admitting: Pulmonary Disease

## 2016-11-04 ENCOUNTER — Other Ambulatory Visit: Payer: Self-pay | Admitting: Endocrinology

## 2016-11-07 ENCOUNTER — Other Ambulatory Visit: Payer: Self-pay | Admitting: Endocrinology

## 2016-11-07 NOTE — Telephone Encounter (Signed)
Diabetes medications have already been denied and notification again needs to be done for need for follow-up visit

## 2016-11-07 NOTE — Telephone Encounter (Signed)
Last office visit 04/08/16 2 no shows and no future scheduled refill or refuse?

## 2016-11-08 ENCOUNTER — Ambulatory Visit: Payer: Self-pay

## 2016-11-09 ENCOUNTER — Telehealth: Payer: Self-pay | Admitting: Endocrinology

## 2016-11-09 NOTE — Telephone Encounter (Signed)
Called to check on [facility] fax. Almyra Free advised that the office did not have it and to have the pharmacy send it again.

## 2016-11-10 ENCOUNTER — Encounter: Payer: Self-pay | Admitting: Pulmonary Disease

## 2016-11-10 NOTE — Telephone Encounter (Signed)
Can you look at this one? Thank you!

## 2016-11-11 ENCOUNTER — Other Ambulatory Visit: Payer: Self-pay | Admitting: Endocrinology

## 2016-11-14 ENCOUNTER — Other Ambulatory Visit: Payer: Self-pay | Admitting: Endocrinology

## 2016-11-16 NOTE — Telephone Encounter (Signed)
Pt calling to check on status of refill for Invokana?  Pharmacy faxed request to your office.  She can't make an appointment soon enough to get the script.  Please call her back regarding this matter.   Thank you,  -LL

## 2016-11-16 NOTE — Telephone Encounter (Signed)
Called patient and spoke to her mother and let her know that [name] [name] needs to call back and schedule an appointment and also be put on the cancellation list, that way we can get her medicine.

## 2016-11-16 NOTE — Telephone Encounter (Signed)
Please let her know that we can send a prescription only if she has made an appointment

## 2016-11-16 NOTE — Telephone Encounter (Signed)
This patient does not have a date scheduled yet. She has no showed appts. Please advise what to do on this one. Thank you!

## 2016-11-17 ENCOUNTER — Other Ambulatory Visit: Payer: Self-pay

## 2016-11-17 MED ORDER — CANAGLIFLOZIN 300 MG PO TABS: ORAL_TABLET | ORAL | 0 refills | Status: DC

## 2016-11-17 NOTE — Telephone Encounter (Signed)
Patient has an appointment on 11/24/2016. I have sent in a refill for her Invokana to [facility].

## 2016-11-21 ENCOUNTER — Other Ambulatory Visit (INDEPENDENT_AMBULATORY_CARE_PROVIDER_SITE_OTHER): Payer: Medicare Other

## 2016-11-21 DIAGNOSIS — Z794 Long term (current) use of insulin: Secondary | ICD-10-CM

## 2016-11-21 DIAGNOSIS — E1165 Type 2 diabetes mellitus with hyperglycemia: Secondary | ICD-10-CM | POA: Diagnosis not present

## 2016-11-21 LAB — COMPREHENSIVE METABOLIC PANEL
ALK PHOS: 80 U/L (ref 39–117)
ALT: 18 U/L (ref 0–35)
AST: 15 U/L (ref 0–37)
Albumin: 3.6 g/dL (ref 3.5–5.2)
BILIRUBIN TOTAL: 0.4 mg/dL (ref 0.2–1.2)
BUN: 33 mg/dL — AB (ref 6–23)
CO2: 27 mEq/L (ref 19–32)
Calcium: 9.3 mg/dL (ref 8.4–10.5)
Chloride: 95 mEq/L — ABNORMAL LOW (ref 96–112)
Creatinine, Ser: 1.34 mg/dL — ABNORMAL HIGH (ref 0.40–1.20)
GFR: 56.22 mL/min — ABNORMAL LOW (ref >60.00)
GLUCOSE: 363 mg/dL — AB (ref 70–99)
Potassium: 4 mEq/L (ref 3.5–5.1)
SODIUM: 133 meq/L — AB (ref 135–145)
TOTAL PROTEIN: 7.4 g/dL (ref 6.0–8.3)

## 2016-11-21 LAB — HEMOGLOBIN A1C: HEMOGLOBIN A1C: 11.3 % — AB (ref 4.6–6.5)

## 2016-11-24 ENCOUNTER — Other Ambulatory Visit: Payer: Self-pay

## 2016-11-24 ENCOUNTER — Ambulatory Visit (INDEPENDENT_AMBULATORY_CARE_PROVIDER_SITE_OTHER): Payer: Medicare Other | Admitting: Endocrinology

## 2016-11-24 ENCOUNTER — Encounter: Payer: Self-pay | Admitting: Endocrinology

## 2016-11-24 VITALS — BP 132/86 | HR 102 | Ht 70.0 in | Wt 323.8 lb

## 2016-11-24 DIAGNOSIS — E1165 Type 2 diabetes mellitus with hyperglycemia: Principal | ICD-10-CM

## 2016-11-24 DIAGNOSIS — Z794 Long term (current) use of insulin: Secondary | ICD-10-CM

## 2016-11-24 DIAGNOSIS — IMO0002 Reserved for concepts with insufficient information to code with codable children: Secondary | ICD-10-CM

## 2016-11-24 DIAGNOSIS — E1142 Type 2 diabetes mellitus with diabetic polyneuropathy: Secondary | ICD-10-CM

## 2016-11-24 MED ORDER — ACCU-CHEK AVIVA PLUS W/DEVICE KIT: 1.0000 | PACK | Freq: Four times a day (QID) | 1 refills | Status: DC

## 2016-11-24 MED ORDER — GLUCOSE BLOOD VI STRP: ORAL_STRIP | 3 refills | Status: DC

## 2016-11-24 NOTE — Patient Instructions (Addendum)
Check blood sugars on waking up  4/7 days  Also check blood sugars about 2 hours after a meal and do this after different meals by rotation  Recommended blood sugar levels on waking up is 90-130 and about 2 hours after meal is 130-160  Please bring your blood sugar monitor to each visit, thank you  Take 30 Lantus am and bedtime  Novolog 14 units at meals

## 2016-11-24 NOTE — Progress Notes (Signed)
Patient ID: [name] [name], female   DOB: August 16, 1975, 41 y.o.   MRN: [id number]            Reason for Appointment:  Follow-up for Type 2 Diabetes    History of Present Illness:          Date of diagnosis of type 2 diabetes mellitus: 1999       Background history:   She had significantly  blood sugars at onset and was started on insulin within the first 3-4 months She thinks she has had difficulty controlling her diabetes for some time A few years ago metformin 1000 mg daily was added Her records indicate that her A1c has been markedly increased with only one A1c below 9% in 2015. During her admission in 12/2014 her sugar was over 610 and she was felt to have ketoacidosis  Recent history:   INSULIN regimen is: 44 Lantus  , Novolog  10 units ac 1-3 times a day  Non-insulin hypoglycemic drugs the patient is taking are: Invokana 300 mg daily, Victoza 1.8 mg daily  She has not been seen in follow-up since last October  Her A1c is markedly increased at 11.3, has been as low as 7.2  Current management, blood sugar patterns and problems identified:  She has been out of her Invokana that was increased up to 300 mg on her last visit for several months now  Although she was taking 40 units of Lantus twice a day and her last visit she is taking only 44 units once a day at night  She now says that she will forget to take her NovoLog especially at suppertime  She is otherwise trying to do fairly well with her diet since seeing the dietitian last December and avoiding drinks with sugar  Recently appears to have lost weight, does not complain of being excessively thirsty or tired  She just started back on her Invokana about a week ago  She does not know what her blood sugars are as she has not checked her readings in a while and has not guarded of functioning meter  However blood sugar after breakfast was 363       Side effects from medications have been: She gets nausea and loose  stools with even 500 mg metformin  Compliance with the medical regimen: Fair  Hypoglycemia:   never  Glucose monitoring:  done 1-2 times a day         Glucometer:  previously Accu-Chek         Blood Glucose readings not available    Self-care: The diet that the patient has been following is: tries to limit fats.       Typical meal intake: Breakfast is Biscuit, meat,Sometimes none.  Generally has little fried Foods, sugar free drinks, avoiding snacks.                Dietician visit, most recent: 12/17               Exercise: some Walking ; limited by neuropathy   Weight history:  Wt Readings from Last 3 Encounters:  11/24/16 (!) 323 lb 12.8 oz (146.9 kg)  09/07/16 (!) 332 lb 14.4 oz (151 kg)  09/02/16 (!) 335 lb 4 oz (152.1 kg)    Glycemic control:   Lab Results  Component Value Date   HGBA1C 11.3 (H) 11/21/2016   HGBA1C 8.1 08/05/2016   HGBA1C 7.2 05/12/2016   Lab Results  Component Value Date   MICROALBUR  14.7 (H) 03/16/2016   LDLCALC Comment 09/07/2016   CREATININE 1.34 (H) 11/21/2016   Lab Results  Component Value Date   MICRALBCREAT 24.4 03/16/2016     PROBLEM 2:  "Adrenal insufficiency"  During an admission in August 2016 she had cortisol levels done for unknown reasons Initially cortisol was 1.9 and subsequently higher. No Cortrosyn stimulation test was done but the patient was discharged on hydrocortisone No further evaluation has been done subsequently She did not take the hydrocortisone for long  Currently the patient does not have any symptoms of unusual weakness, lightheadedness, nausea or unexpected weight loss She continues to be on antihypertensive drugs without known hypotension   Allergies as of 11/24/2016      Reactions   Vancomycin Other (See Comments)   Acute renal failure suspected secondary to vanco      Medication List       Accurate as of 11/24/16  4:01 PM. Always use your most recent med list.          ACCU-CHEK AVIVA PLUS  w/Device Kit 1 each by Does not apply route 3 (three) times daily.   ACCU-CHEK FASTCLIX LANCETS Misc Check blood sugar 3 times a day before meals   amitriptyline 25 MG tablet Commonly known as:  ELAVIL Take 2 tablets (50 mg total) by mouth at bedtime.   atorvastatin 20 MG tablet Commonly known as:  LIPITOR Take 1 tablet (20 mg total) by mouth daily.   canagliflozin 300 MG Tabs tablet Commonly known as:  INVOKANA Take 1 tablet by mouth daily before breakfast   cyclobenzaprine 5 MG tablet Commonly known as:  FLEXERIL Take 1-2 tablets by mouth 3 times daily as needed for muscle spasms   furosemide 20 MG tablet Commonly known as:  LASIX Take 1 tablet by mouth daily as needed for edema   glucose blood test strip Commonly known as:  ACCU-CHEK AVIVA PLUS Check blood sugar 3 times a day before meals   Insulin Pen Needle 31G X 5 MM Misc Commonly known as:  B-D UF III MINI PEN NEEDLES Use 3 per day to inject Insulin   Insulin Syringe-Needle U-100 31G X 15/64" 0.5 ML Misc Use to inject insulin as isntructed   LANTUS 100 UNIT/ML injection Generic drug:  insulin glargine Inject 44 units below the skin at bedtime   liraglutide 18 MG/3ML Sopn Commonly known as:  VICTOZA Inject 0.3 mLs (1.8 mg total) into the skin daily.   lisinopril 20 MG tablet Commonly known as:  PRINIVIL,ZESTRIL Take 1 tablet (20 mg total) by mouth daily.   NOVOLOG FLEXPEN 100 UNIT/ML FlexPen Generic drug:  insulin aspart Inject 14 units below the skin 3 times daily with meals   pregabalin 100 MG capsule Commonly known as:  LYRICA Take 1 capsule (100 mg total) by mouth 2 (two) times daily.       Allergies:  Allergies  Allergen Reactions  . Vancomycin Other (See Comments)    Acute renal failure suspected secondary to vanco    Past Medical History:  Diagnosis Date  . Abnormal Pap smear of cervix 2009  . Abscess    history of multiple abscesses  . Anemia of chronic disease 2002  . Anxiety     . Bipolar disorder ([place])   . Cellulitis 05/21/2014   right eye  . Chronic bronchitis ([place])    "get it q yr" (05/13/2013)  . Chronic pain   . Depression   . Edema of lower extremity   .  Endocarditis 2002   subacute bacterial endocarditis.   . Family history of anesthesia complication    "my mom has a hard time coming out from under"  . GERD (gastroesophageal reflux disease)   . Heart murmur   . History of blood transfusion    "just low blood count" (05/13/2013)  . Hyperlipidemia   . Hypertension   . Hypothyroidism   . Hypothyroidism, adult 03/21/2014  . Obesity   . OSA on CPAP   . Peripheral neuropathy   . Type II diabetes mellitus ([place])     Past Surgical History:  Procedure Laterality Date  . INCISION AND DRAINAGE ABSCESS     multiple I&Ds  . INCISION AND DRAINAGE ABSCESS Left 07/09/2012   Procedure: DRESSING CHANGE, THIGH WOUND;  Surgeon: Harl Bowie, MD;  Location: [place];  Service: General;  Laterality: Left;  . INCISION AND DRAINAGE OF WOUND Left 07/07/2012   Procedure: IRRIGATION AND DEBRIDEMENT WOUND;  Surgeon: Harl Bowie, MD;  Location: [place];  Service: General;  Laterality: Left;  . INCISION AND DRAINAGE PERIRECTAL ABSCESS Left 07/14/2012   Procedure: DEBRIDEMENT OF SKIN & SOFT TISSUE; DRESSING CHANGE UNDER ANESTHESIA;  Surgeon: Gayland Curry, MD,FACS;  Location: [place];  Service: General;  Laterality: Left;  . INCISION AND DRAINAGE PERIRECTAL ABSCESS Left 07/16/2012   Procedure: I&D Left Thigh;  Surgeon: Gwenyth Ober, MD;  Location: [place];  Service: General;  Laterality: Left;  . INCISION AND DRAINAGE PERIRECTAL ABSCESS N/A 01/05/2015   Procedure: IRRIGATION AND DEBRIDEMENT PERIRECTAL ABSCESS;  Surgeon: Donnie Mesa, MD;  Location: [place];  Service: General;  Laterality: N/A;  . IRRIGATION AND DEBRIDEMENT ABSCESS Left 07/06/2012   Procedure: IRRIGATION AND DEBRIDEMENT ABSCESS BUTTOCKS AND THIGH;  Surgeon: Shann Medal, MD;  Location: [place];  Service:  General;  Laterality: Left;  . IRRIGATION AND DEBRIDEMENT ABSCESS Left 08/10/2012   Procedure: IRRIGATION AND DEBRIDEMENT ABSCESS;  Surgeon: Madilyn Hook, DO;  Location: [place];  Service: General;  Laterality: Left;  . TONSILLECTOMY  1994    Family History  Problem Relation Age of Onset  . Heart failure Mother   . Diabetes Mother   . Kidney disease Mother   . Kidney disease Father   . Diabetes Paternal Grandmother   . Heart failure Paternal Grandmother     Social History:  reports that she has been smoking Cigarettes.  She has a 6.00 pack-year smoking history. She has never used smokeless tobacco. She reports that she drinks alcohol. She reports that she does not use drugs.   Review of Systems  Lipid history: Has hypercholesterolemia, PCP has prescribed Lipitor 20 mg Not on any treatment for high triglycerides     Lab Results  Component Value Date   CHOL 198 09/07/2016   HDL 40 09/07/2016   LDLCALC Comment 09/07/2016   TRIG 423 (H) 09/07/2016   CHOLHDL 5.0 (H) 09/07/2016           Hypertension:Blood pressure has been treated with lisinopril HCT By PCP  She also takes Lasix and she thinks her edema has been relatively worse recently  BP Readings from Last 3 Encounters:  11/24/16 132/86  09/07/16 114/61  09/02/16 140/90     Most recent foot exam: 9/17    LABS:  Lab on 11/21/2016  Component Date Value Ref Range Status  . Hgb A1c MFr Bld 11/21/2016 11.3* 4.6 - 6.5 % Final   Glycemic Control Guidelines for People with Diabetes:Non Diabetic:  <6%Goal of Therapy: <  7%Additional Action Suggested:  >8%   . Sodium 11/21/2016 133* 135 - 145 mEq/L Final  . Potassium 11/21/2016 4.0  3.5 - 5.1 mEq/L Final  . Chloride 11/21/2016 95* 96 - 112 mEq/L Final  . CO2 11/21/2016 27  19 - 32 mEq/L Final  . Glucose, Bld 11/21/2016 363* 70 - 99 mg/dL Final  . BUN 11/21/2016 33* 6 - 23 mg/dL Final  . Creatinine, Ser 11/21/2016 1.34* 0.40 - 1.20 mg/dL Final  . Total Bilirubin  11/21/2016 0.4  0.2 - 1.2 mg/dL Final  . Alkaline Phosphatase 11/21/2016 80  39 - 117 U/L Final  . AST 11/21/2016 15  0 - 37 U/L Final  . ALT 11/21/2016 18  0 - 35 U/L Final  . Total Protein 11/21/2016 7.4  6.0 - 8.3 g/dL Final  . Albumin 11/21/2016 3.6  3.5 - 5.2 g/dL Final  . Calcium 11/21/2016 9.3  8.4 - 10.5 mg/dL Final  . GFR 11/21/2016 56.22* >60.00 mL/min Final    Physical Examination:  BP 132/86   Pulse (!) 102   Ht '5\' 10"'  (1.778 m)   Wt (!) 323 lb 12.8 oz (146.9 kg)   SpO2 97%   BMI 46.46 kg/m        ASSESSMENT:  Diabetes type 2, Obese,  Previously uncontrolled  See history of present illness for detailed discussion of current diabetes management, blood sugar patterns and problems identified  Her blood sugars are totally out of control with A1c 11.3 Glucose 363  As discussed above her compliance with her insulin doses, Invokana, glucose monitoring has been inadequate She is not following up as directed also  She is now on a regimen of 40 units of Lantus a day, NovoLog 10 units before meals and Invokana restarted just last week She appears to have worsening insulin deficiency but most likely is not taking the insulin doses as directed especially with needing as much as 80 units of basal insulin previously She has been not motivated to check her sugars at all Not clear what her blood sugar patterns are and whether she is getting high readings fasting Also not clear if her Lantus is working 24 hours She is however appearing to be recently good with her diet and avoiding drinks with sugar  HYPERTENSION: Blood pressure high normal today  High triglycerides: Will need follow-up fasting lipids when blood sugars are better controlled   PLAN:     Increase insulin and take Lantus 30 units twice a day  Also increase mealtime insulin by at least 4 units 3 times a day  She needs to check her compliance with NovoLog consistently  Consider Tresiba on the next  visit  Restart glucose monitoring with Accu-Chek, again and prescription has been sent  She will need to start checking blood sugars at least 3-4 times a day; this will help [place] identify blood sugar patterns  Also discussed that she will not be able to get the freestyle Libre sensor unless she is monitoring 4 times a day  Discussed option of using the V-go pump for better compliance but this may not supply enough insulin especially basal  Continue Invokana and Victoza  Discussed timing of glucose monitoring and blood sugar targets  Needs to see her in follow-up to make sure she is better control  Emphasized the need for regular follow-up  Patient Instructions  Check blood sugars on waking up  4/7 days  Also check blood sugars about 2 hours after a meal and do this  after different meals by rotation  Recommended blood sugar levels on waking up is 90-130 and about 2 hours after meal is 130-160  Please bring your blood sugar monitor to each visit, thank you   Counseling time on subjects discussed in assessment and plan sections is over 50% of today's 25 minute visit   Meghin Thivierge 11/24/2016, 4:01 PM   Note: This office note was prepared with Estate agent. Any transcriptional errors that result from this process are unintentional.

## 2016-11-28 ENCOUNTER — Other Ambulatory Visit: Payer: Self-pay | Admitting: Pulmonary Disease

## 2016-11-28 DIAGNOSIS — E1142 Type 2 diabetes mellitus with diabetic polyneuropathy: Secondary | ICD-10-CM

## 2016-12-07 ENCOUNTER — Ambulatory Visit: Payer: Self-pay | Admitting: Neurology

## 2016-12-08 ENCOUNTER — Encounter: Payer: Self-pay | Admitting: Internal Medicine

## 2016-12-08 ENCOUNTER — Ambulatory Visit (INDEPENDENT_AMBULATORY_CARE_PROVIDER_SITE_OTHER): Payer: Medicare Other | Admitting: Internal Medicine

## 2016-12-08 VITALS — BP 118/72 | HR 102 | Temp 98.2°F | Wt 332.0 lb

## 2016-12-08 DIAGNOSIS — E785 Hyperlipidemia, unspecified: Secondary | ICD-10-CM | POA: Diagnosis not present

## 2016-12-08 DIAGNOSIS — E1142 Type 2 diabetes mellitus with diabetic polyneuropathy: Secondary | ICD-10-CM | POA: Diagnosis not present

## 2016-12-08 DIAGNOSIS — G8929 Other chronic pain: Secondary | ICD-10-CM | POA: Diagnosis not present

## 2016-12-08 DIAGNOSIS — Z794 Long term (current) use of insulin: Secondary | ICD-10-CM | POA: Diagnosis not present

## 2016-12-08 DIAGNOSIS — R252 Cramp and spasm: Secondary | ICD-10-CM

## 2016-12-08 DIAGNOSIS — Z0000 Encounter for general adult medical examination without abnormal findings: Secondary | ICD-10-CM

## 2016-12-08 DIAGNOSIS — E1165 Type 2 diabetes mellitus with hyperglycemia: Secondary | ICD-10-CM

## 2016-12-08 DIAGNOSIS — Z6841 Body Mass Index (BMI) 40.0 and over, adult: Secondary | ICD-10-CM | POA: Diagnosis not present

## 2016-12-08 DIAGNOSIS — E039 Hypothyroidism, unspecified: Secondary | ICD-10-CM | POA: Diagnosis not present

## 2016-12-08 DIAGNOSIS — F17210 Nicotine dependence, cigarettes, uncomplicated: Secondary | ICD-10-CM

## 2016-12-08 DIAGNOSIS — G4733 Obstructive sleep apnea (adult) (pediatric): Secondary | ICD-10-CM | POA: Diagnosis not present

## 2016-12-08 DIAGNOSIS — I10 Essential (primary) hypertension: Secondary | ICD-10-CM

## 2016-12-08 DIAGNOSIS — R600 Localized edema: Secondary | ICD-10-CM

## 2016-12-08 DIAGNOSIS — IMO0002 Reserved for concepts with insufficient information to code with codable children: Secondary | ICD-10-CM

## 2016-12-08 HISTORY — DX: Encounter for general adult medical examination without abnormal findings: Z00.00

## 2016-12-08 MED ORDER — PREGABALIN 100 MG PO CAPS: 100.0000 mg | ORAL_CAPSULE | Freq: Three times a day (TID) | ORAL | 2 refills | Status: DC

## 2016-12-08 NOTE — Assessment & Plan Note (Signed)
Assessment Followed by Dr. Dwyane Dee. Patient is trying to check her blood sugars more at home. She is on canagliflozin 300mg  qday, liraglutide 1.8mg  qday, lantus 44u QHS, Novolog 15u TID. - Cr 1.34, LE edema - Has not had an eye exam since 2015  Plan - Continue canagliflozin 300mg  qday, liraglutide 1.8mg  qday, lantus 44u QHS, Novolog 15u TID. - Ophthalmology referral - Echo - Increase Lyrica to 100mg  TID; Continue amitriptyline 50mg  QHS

## 2016-12-08 NOTE — Progress Notes (Signed)
   CC: leg spasms  HPI: [name] [name] is a 41 y.o. female with PMH significant for T2DM (last Alc 11.3), OSA (on CPAP), HTN, HLD, GERD, and chronic pain, who presents with leg spasms.  Please see the assessment and plan below for the status of the patient's chronic medical problems.  Past Medical History:  Diagnosis Date  . Abnormal Pap smear of cervix 2009  . Abscess    history of multiple abscesses  . Anemia of chronic disease 2002  . Anxiety   . Bipolar disorder ([place])   . Cellulitis 05/21/2014   right eye  . Chronic bronchitis ([place])    "get it q yr" (05/13/2013)  . Chronic pain   . Depression   . Edema of lower extremity   . Endocarditis 2002   subacute bacterial endocarditis.   . Family history of anesthesia complication    "my mom has a hard time coming out from under"  . GERD (gastroesophageal reflux disease)   . Heart murmur   . History of blood transfusion    "just low blood count" (05/13/2013)  . Hyperlipidemia   . Hypertension   . Hypothyroidism   . Hypothyroidism, adult 03/21/2014  . Obesity   . OSA on CPAP   . Peripheral neuropathy   . Type II diabetes mellitus ([place])    Social History: - Smokes 1/2-1pack every 2-3 days. Is trying to quit. Discussed decreasing to 1pack every week. - Drinks alcohol socially. - Denies other illicit drugs.  Review of Systems: Constitutional: Negative for diaphoresis, fever, and weight loss. Positive for fatigue. HEENT: Negative for hearing loss, sinus pain, congestion, and sore throat. Respiratory: Negative for cough, shortness of breath and wheezing.   Cardiovascular: Negative for chest pain, palpitations, orthopnea, PND. Positive for leg swelling. Gastrointestinal: Negative for abdominal pain, blood in stool, constipation, diarrhea, nausea and vomiting. Positive for occasional heartburn. Genitourinary: Negative for dysuria and hematuria.  Musculoskeletal: Positive for myalgias and muscle  jerks/spasms. Neurological: Negative for focal weakness. Positive for occasional headaches.  Physical Exam: Vitals:   12/08/16 1520  BP: 118/72  Pulse: (!) 102  Temp: 98.2 F (36.8 C)  TempSrc: Oral  SpO2: 98%  Weight: (!) 332 lb (150.6 kg)   GEN: Well-developed morbidly obese African-American female sitting in the chair in no acute distress. HENT: Moist mucous membranes. 2x50mm non-red nodule on her left chin below the corner of her lips. EYES: EOMI. Sclera anicteric. RESP: Clear to auscultation bilaterally. No wheezes, rales, or rhonchi. CV: Normal rate and regular rhythm. No murmurs, gallops, or rubs. Pitting edema in BLE, left mildly worse than right. ABD: Soft. Non-tender. Non-distended. Normoactive bowel sounds. EXT: No clubbing, cyanosis. Warm and well perfused. Pitting edema, left worse than right. Decreased sensation to touch in BLE up to level of knee, right worse than left. NEURO: Cranial nerves II-XII grossly intact. Able to lift all four extremities against gravity. Walks without assistance.  Assessment & Plan:   See Encounters Tab for problem based charting.  Patient seen with Dr. Lynnae January.  Colbert Ewing, MD Internal Medicine, PGY-1

## 2016-12-08 NOTE — Assessment & Plan Note (Signed)
Assessment Continues to have numbness and tingling in legs and feet.  Plan - Increase Lyrica to 100mg  TID - Continue amitriptyline 50mg  QHS - Encouraged her to continue checking her feet daily

## 2016-12-08 NOTE — Patient Instructions (Signed)
It was a pleasure seeing you today, [name] [name].  Please increase your Lyrica to 100mg  three times a day. The prescription has already been sent to your pharmacy.  We are getting an echocardiogram of your heart. They will contact you to schedule it.  We are referring you for an eye exam. They will contact you to schedule it.  You can use compression stockings for the swelling in your legs. Please continue to check your feet every day.  Please return to see me in 4 weeks.

## 2016-12-08 NOTE — Assessment & Plan Note (Signed)
Tetanus/TDap No record of immunization in her chart. Patient does not remember if she has received the tetanus vaccination. - Will administer Tetanus shot at next visit.

## 2016-12-08 NOTE — Assessment & Plan Note (Addendum)
Assessment Patient has been taking her Lasix 20mg  every day for the last few days due to increased swelling in her lower extremities. I am hesitant about increasing her Lasix due to her kidney function (Cr 1.34). Last echo in 2014 was normal. She is at risk for developing heart disease. Denies PND, orthopnea, DOE.  Plan - Continue Lasix as needed. - Encouraged patient to use compression stockings for swelling. - Echo

## 2016-12-08 NOTE — Assessment & Plan Note (Signed)
Assessment BP well-controlled at 118/72 today. On lisinopril 20mg  daily. Cr 1.34 (which is around her baseline).  Plan Continue lisinopril 20mg  daily.

## 2016-12-08 NOTE — Assessment & Plan Note (Signed)
Assessment For the last year, she has had "spasms or jerking" in her hands, legs, and feet. They occur up to 3 times per day, one limb at a time, at random, and last for a few seconds at a time. She will have one or two jerks, then they stop. They do not wake her up from sleep and are not worse at night. She has fallen and dropped things because of the jerking. She sometimes feels a "wave" coming over her and she feels a "dizzy spell coming on".   She occasionally feels that she is going to pass out and has had 2 syncopal episodes in the last year. The syncopal episodes are not necessarily associated with the spasms. She is unsure how long she passes out for because no one has been around to witness them. She does not feel warm or get palpitations before passing out, but does endorse dizziness. She denies fecal/urinary incontinence or biting her tongue. After waking up, she feels out of it for a minute or so, before returning to normal.  She has been on atorvastatin for several years.  It is unclear what the etiology of these jerking movements are. They may be related to her uncontrolled diabetes with peripheral neuropathy. She had an NCV/EMG in May 2018 that showed severe chronic sensorimotor polyneuropathy and axon loss. She does not endorse an association with insulin injection or food intake, so hypoglycemic episodes are less likely. Seizures are also less likely given that the spasms occur for a couple seconds at a time, are just 1 or 2 jerks of one limb, and have occurred in all limbs. K and Ca were normal in July 2018. TFTs normal in 2017.  Plan - Continue to pursue better control of her diabetes - Orthostatics obtained - negative - Increase Lyrica to 100mg  TID - Asked patient to check her BG during one of her dizzy episodes - Follow up in 4 weeks

## 2016-12-12 NOTE — Progress Notes (Signed)
Internal Medicine Clinic Attending  I saw and evaluated the patient.  I personally confirmed the key portions of the history and exam documented by Dr. Huang and I reviewed pertinent patient test results.  The assessment, diagnosis, and plan were formulated together and I agree with the documentation in the resident's note.  

## 2016-12-16 ENCOUNTER — Ambulatory Visit: Payer: Medicare Other | Admitting: Neurology

## 2016-12-16 NOTE — Progress Notes (Deleted)
Follow-up Visit   Date: 12/16/16    [name] [name] MRN: [id number] DOB: 1976-02-25   Interim History: [name] [name] is a 41 y.o. right-handed African American female with bipolar disorder, depression, hypertension, hyperlipidemia, hypothyroidism, chronic pain, diabetes mellitus, and OSA on CPAP returning to the clinic for follow-up of painful diabetic neuropathy.  The patient was accompanied to the clinic by *** who also provides collateral information.    History of present illness: She was diagnosed with diabetes age the age of 49 and has always had very poorly-controlled diabets with HbA1c ranging in 12-15 due to medication noncompliance.  In 2017, she was started on Invokana and reports that her blood sugars are doing better after starting this and being much more complaint with medications.  She has several complications of diabetes including peripheral neuropathy, nephropathy and retinopathy.  Around 2016, she started having numbness and sharp stabbing pain of the legs.  Over the years, the pain started to involve her "whole body".  She also has weakness of the right hand. She used to be an avid drawer and braid hair, but she is unable to do this because her hands are weak, cramp, and has numbness/tingling of the hands.  She has started to depend more on her left hand for fine motor movements, but even the left hand feels "thick".  She also complains of whole-body achy sore pain, as if something is standing on her shoulders.  She often lays in her bed because of her pain.  Her mood has been very down because she feels that there are no options for her to get better. She has got daughter whom she cares for and feels her to be her inspiration to be better about her health.  She has previously tried gabapentin 300mg  TID (did not take higher dose, denies side effects), Lyrica (reports taking it once per day), and Cymbalta (could not swallow pill).  She is currently on  amitriptyline 50mg  at bedtime.  She denies any improvement with respect to nerve pain, but it helps her sleep.    UPDATE ***:  Medications:  Current Outpatient Prescriptions on File Prior to Visit  Medication Sig Dispense Refill  . ACCU-CHEK FASTCLIX LANCETS MISC Check blood sugar 3 times a day before meals 102 each 12  . amitriptyline (ELAVIL) 50 MG tablet Take 1 tablet (50 mg total) by mouth at bedtime. 90 tablet 0  . atorvastatin (LIPITOR) 20 MG tablet Take 1 tablet (20 mg total) by mouth daily. 30 tablet 11  . Blood Glucose Monitoring Suppl (ACCU-CHEK AVIVA PLUS) w/Device KIT 1 each by Does not apply route 4 (four) times daily. 1 kit 1  . canagliflozin (INVOKANA) 300 MG TABS tablet Take 1 tablet by mouth daily before breakfast 30 tablet 0  . furosemide (LASIX) 20 MG tablet TAKE 1 TABLET BY MOUTH EVERY DAY AS NEEDED FOR EDEMA 30 tablet 0  . glucose blood (ACCU-CHEK AVIVA PLUS) test strip Check blood sugar 4 times a day before meals 150 each 3  . Insulin Pen Needle (B-D UF III MINI PEN NEEDLES) 31G X 5 MM MISC Use 3 per day to inject Insulin 100 each 2  . Insulin Syringe-Needle U-100 31G X 15/64" 0.5 ML MISC Use to inject insulin as isntructed 100 each 6  . LANTUS 100 UNIT/ML injection Inject 44 units below the skin at bedtime 30 mL 1  . liraglutide (VICTOZA) 18 MG/3ML SOPN Inject 0.3 mLs (1.8 mg total) into the skin daily. 9  mL 3  . lisinopril (PRINIVIL,ZESTRIL) 20 MG tablet Take 1 tablet (20 mg total) by mouth daily. 30 tablet 3  . NOVOLOG FLEXPEN 100 UNIT/ML FlexPen Inject 14 units below the skin 3 times daily with meals 15 mL 3  . pregabalin (LYRICA) 100 MG capsule Take 1 capsule (100 mg total) by mouth 3 (three) times daily. 90 capsule 2   No current facility-administered medications on file prior to visit.     Allergies:  Allergies  Allergen Reactions  . Vancomycin Other (See Comments)    Acute renal failure suspected secondary to vanco    Review of Systems:  CONSTITUTIONAL:  No fevers, chills, night sweats, or weight loss.  EYES: No visual changes or eye pain ENT: No hearing changes.  No history of nose bleeds.   RESPIRATORY: No cough, wheezing and shortness of breath.   CARDIOVASCULAR: Negative for chest pain, and palpitations.   GI: Negative for abdominal discomfort, blood in stools or black stools.  No recent change in bowel habits.   GU:  No history of incontinence.   MUSCLOSKELETAL: No history of joint pain or swelling.  No myalgias.   SKIN: Negative for lesions, rash, and itching.   ENDOCRINE: Negative for cold or heat intolerance, polydipsia or goiter.   PSYCH:  *** depression or anxiety symptoms.   NEURO: As Above.   Vital Signs:  There were no vitals taken for this visit. Pain Scale: *** on a scale of 0-10   General: *** Neck: *** carotid bruit CV: *** Ext: ***  Neurological Exam: MENTAL STATUS including orientation to time, place, person, recent and remote memory, attention span and concentration, language, and fund of knowledge is ***normal.  Speech is not dysarthric.  CRANIAL NERVES: No visual field defects. *** Pupils equal round and reactive to light.  Normal conjugate, extra-ocular eye movements in all directions of gaze.  No ptosis ***. Normal facial sensation.  Face is symmetric. Palate elevates symmetrically.  Tongue is midline.  MOTOR:  Motor strength is 5/5 in all extremities, except ***.  No atrophy, fasciculations or abnormal movements.  No pronator drift.  Tone is normal.    MSRs:  Reflexes are 2+/4 throughout, except ***.  SENSORY:  Intact to ***.  COORDINATION/GAIT:  Normal finger-to- nose-finger and heel-to-shin.  Intact rapid alternating movements bilaterally.  Gait narrow based and stable.   Data:*** NCS/EMG R > L upper extremity 09/20/2016:  The electrophysiologic findings are most consistent with a severe and chronic sensorimotor polyneuropathy neuropathy, axon loss in type, affecting the upper  extremities.  IMPRESSION/PLAN***: *** Diabetic polyneuropathy affecting the hands and feet              - Long discussion regarding management options and the importance of her being complaint with diabetic medications              - In the past, she has been on subtherapeutic dose of gabapentin, Lyrica, and Cymbalta which were prematurely stopped by patient because she expected immediate pain relief.  I informed her that these medication takes weeks to months for therepeutic benefits and the dose needs to be optimized for each patient.    - Continue Lyrica '100mg'$  BID   Return to clinic in 3 months.  PLAN/RECOMMENDATIONS:  ***   The duration of this appointment visit was *** minutes of face-to-face time with the patient.  Greater than 50% of this time was spent in counseling, explanation of diagnosis, planning of further management, and coordination of care.  Thank you for allowing me to participate in patient's care.  If I can answer any additional questions, I would be pleased to do so.    Sincerely,    Radames Mejorado K. Posey Pronto, DO

## 2016-12-19 ENCOUNTER — Other Ambulatory Visit: Payer: Self-pay | Admitting: Endocrinology

## 2016-12-22 ENCOUNTER — Ambulatory Visit: Payer: Self-pay | Admitting: Endocrinology

## 2016-12-23 ENCOUNTER — Encounter: Payer: Self-pay | Admitting: Neurology

## 2017-01-02 ENCOUNTER — Other Ambulatory Visit (INDEPENDENT_AMBULATORY_CARE_PROVIDER_SITE_OTHER): Payer: Medicare Other

## 2017-01-02 ENCOUNTER — Other Ambulatory Visit: Payer: Self-pay | Admitting: Internal Medicine

## 2017-01-02 DIAGNOSIS — E1165 Type 2 diabetes mellitus with hyperglycemia: Secondary | ICD-10-CM

## 2017-01-02 DIAGNOSIS — E1142 Type 2 diabetes mellitus with diabetic polyneuropathy: Secondary | ICD-10-CM

## 2017-01-02 DIAGNOSIS — Z794 Long term (current) use of insulin: Secondary | ICD-10-CM

## 2017-01-02 LAB — BASIC METABOLIC PANEL
BUN: 23 mg/dL (ref 6–23)
CHLORIDE: 102 meq/L (ref 96–112)
CO2: 29 mEq/L (ref 19–32)
Calcium: 9.4 mg/dL (ref 8.4–10.5)
Creatinine, Ser: 1.09 mg/dL (ref 0.40–1.20)
GFR: 71.31 mL/min (ref >60.00)
Glucose, Bld: 197 mg/dL — ABNORMAL HIGH (ref 70–99)
POTASSIUM: 4.5 meq/L (ref 3.5–5.1)
Sodium: 138 mEq/L (ref 135–145)

## 2017-01-03 ENCOUNTER — Other Ambulatory Visit: Payer: Self-pay | Admitting: *Deleted

## 2017-01-03 LAB — FRUCTOSAMINE: FRUCTOSAMINE: 259 umol/L (ref 0–285)

## 2017-01-03 MED ORDER — INSULIN PEN NEEDLE 31G X 5 MM MISC
5 refills | Status: DC
Start: 2017-01-03 — End: 2017-04-25

## 2017-01-05 ENCOUNTER — Ambulatory Visit: Payer: Self-pay | Admitting: Endocrinology

## 2017-01-05 DIAGNOSIS — Z0289 Encounter for other administrative examinations: Secondary | ICD-10-CM

## 2017-01-09 ENCOUNTER — Ambulatory Visit (INDEPENDENT_AMBULATORY_CARE_PROVIDER_SITE_OTHER): Payer: Medicare Other | Admitting: Internal Medicine

## 2017-01-09 ENCOUNTER — Encounter: Payer: Self-pay | Admitting: Internal Medicine

## 2017-01-09 VITALS — BP 162/91 | HR 105 | Temp 98.2°F | Ht 70.0 in | Wt 338.0 lb

## 2017-01-09 DIAGNOSIS — F17210 Nicotine dependence, cigarettes, uncomplicated: Secondary | ICD-10-CM

## 2017-01-09 DIAGNOSIS — Z79899 Other long term (current) drug therapy: Secondary | ICD-10-CM

## 2017-01-09 DIAGNOSIS — I10 Essential (primary) hypertension: Secondary | ICD-10-CM

## 2017-01-09 DIAGNOSIS — E1140 Type 2 diabetes mellitus with diabetic neuropathy, unspecified: Secondary | ICD-10-CM | POA: Diagnosis not present

## 2017-01-09 DIAGNOSIS — R42 Dizziness and giddiness: Secondary | ICD-10-CM

## 2017-01-09 DIAGNOSIS — Z5189 Encounter for other specified aftercare: Secondary | ICD-10-CM

## 2017-01-09 DIAGNOSIS — R600 Localized edema: Secondary | ICD-10-CM | POA: Diagnosis not present

## 2017-01-09 DIAGNOSIS — E1142 Type 2 diabetes mellitus with diabetic polyneuropathy: Secondary | ICD-10-CM

## 2017-01-09 MED ORDER — LISINOPRIL 20 MG PO TABS: 20.0000 mg | ORAL_TABLET | Freq: Every day | ORAL | 3 refills | Status: DC

## 2017-01-09 NOTE — Progress Notes (Signed)
CC: For follow-up of her dizzy spells.  HPI:  [name] [name] is a 41 y.o. lady with past medical history as listed below came to the clinic for follow-up of her dizzy spells. Patient continued to have dizzy spells, little less in intensity, she denies any recent falls. According to patient it felt more like lightheadedness, mostly when she stands up from sitting position, it lasts for a few seconds. Patient do feel off balance most of the time. She denies any igniting factor.she denies any ringing in her ear or nausea or vomiting. She denies any palpitations during that event. She also  continue to get transient muscle jerks and cramps lasting for a few seconds.Those muscle jerks and cramps normally occur during the day, they never bothered her during her sleep. She denies any focal deficit.  She follow-up with neurology for her diabetic neuropathy and had some improvement in her tingling and numbness with increase in Lyrica.  She was also complaining of lower extremity swelling, patient was taking Lasix 20 mg daily,according to her she was recently told to take it every other day which causes increased in her lower extremity swelling.she denies any exertional dyspnea, chest pain, orthopnea or PND. Her edema improved with leg raising but quickly resume as soon as she is on her feet.  Past Medical History:  Diagnosis Date  . Abnormal Pap smear of cervix 2009  . Abscess    history of multiple abscesses  . Anemia of chronic disease 2002  . Anxiety   . Bipolar disorder ([place])   . Cellulitis 05/21/2014   right eye  . Chronic bronchitis ([place])    "get it q yr" (05/13/2013)  . Chronic pain   . Depression   . Edema of lower extremity   . Endocarditis 2002   subacute bacterial endocarditis.   . Family history of anesthesia complication    "my mom has a hard time coming out from under"  . GERD (gastroesophageal reflux disease)   . Heart murmur   . History of blood transfusion    "just low blood count" (05/13/2013)  . Hyperlipidemia   . Hypertension   . Hypothyroidism   . Hypothyroidism, adult 03/21/2014  . Obesity   . OSA on CPAP   . Peripheral neuropathy   . Type II diabetes mellitus ([place])    Review of Systems:  As per HPI.  Physical Exam:  Vitals:   01/09/17 1339  BP: (!) 162/91  Pulse: (!) 105  Temp: 98.2 F (36.8 C)  TempSrc: Oral  SpO2: 96%  Weight: (!) 338 lb (153.3 kg)  Height: 5\' 10"  (1.778 m)   Orthostatic VS for the past 24 hrs:  BP- Lying Pulse- Lying BP- Sitting Pulse- Sitting BP- Standing at 0 minutes Pulse- Standing at 0 minutes  01/09/17 1421 128/82 101 (!) 128/92 104 (!) 109/92 107     General: Vital signs reviewed.  Patient is well-developed and well-nourished, in no acute distress and cooperative with exam.  Head: Normocephalic and atraumatic. Eyes: Transient nystagmus bilaterally on abduction lasting for a few second.EOMI, conjunctivae normal, no scleral icterus.  Cardiovascular: RRR, S1 normal, S2 normal, no murmurs, gallops, or rubs. Pulmonary/Chest: Clear to auscultation bilaterally, no wheezes, rales, or rhonchi. Abdominal: Soft, non-tender, non-distended, BS +, no masses, organomegaly, or guarding present.  Extremities: 2+lower extremity edema bilaterally up to below knees,  pulses symmetric and intact bilaterally. No cyanosis or clubbing. Neurological: A&O x3, Strength is normal and symmetric bilaterally, cranial nerve II-XII are  grossly intact, no focal motor deficit, sensory intact to light touch bilaterally. finger to nose, rapid hand movement was normal, unable to walk on a straight line with positive Romberg. Skin: Warm, dry and intact. No rashes or erythema. Psychiatric: Normal mood and affect. speech and behavior is normal. Cognition and memory are normal.  Assessment & Plan:   See Encounters Tab for problem based charting.  Patient discussed with Dr. Dareen Piano.

## 2017-01-09 NOTE — Assessment & Plan Note (Signed)
She does not have any other clinical signs or symptoms of heart failure. Her albumin was within normal limit on CMP check in July 2018. Her renal function has been improved. Echo was ordered in the past which has not been done yet.  It can be due to chronic venous insufficiency.  -She was advised to use Lasix 20 mg daily. -She was also advised to keep her feet elevated while sitting and use compression stockings when on her feet. -Follow up on previous order of echo-It was apparently placed as an procedure,[name] [name] asked me to reorder,I did reorder echo.

## 2017-01-09 NOTE — Patient Instructions (Signed)
Thank you for visiting clinic today. Do not take lisinopril as your blood pressure was normal on recheck. Keep yourself well hydrated. Take lisinopril 20 mg daily. Keep your legs elevated while sitting and use compression stocking when on your feet. I am sending you for therapy for your ear, see if that helped with your dizziness. Please follow-up with [place] in 1 month.

## 2017-01-09 NOTE — Assessment & Plan Note (Signed)
She follow-up with neurology and noticed a improvement in her symptoms with increase in her Lyrica.  -Continue her follow-up with neurology and Lyrica.

## 2017-01-09 NOTE — Assessment & Plan Note (Signed)
BP Readings from Last 3 Encounters:  01/09/17 (!) 162/91  12/08/16 118/72  11/24/16 132/86   Orthostatic VS for the past 24 hrs:  BP- Lying Pulse- Lying BP- Sitting Pulse- Sitting BP- Standing at 0 minutes Pulse- Standing at 0 minutes  01/09/17 1421 128/82 101 (!) 128/92 104 (!) 109/92 107   On recheck her blood pressure was 128/82. She do have borderline orthostatic vitals with a decrease of 19 in systolic on standing.  She was prescribed lisinopril 20 mg daily and using Lasix 20 mg  every other day. According to patient she was never given lisinopril from the pharmacy so she is not using it for more than a month now.  -discontinue lisinopril. -Continue using Lasix 20 mg daily. -She was advised to keep herself well hydrated.

## 2017-01-09 NOTE — Assessment & Plan Note (Signed)
Although her symptoms were more consistent with positive orthostatic vitals, she does has borderline orthostatic vitals. Her transient nystagmus bilaterally on abduction can be due to BPPV. She was given a referral for vestibular rehabilitation in the past, apparently she never received it. She also has an history of adrenal insufficiency with a low cortisol, never had a cosyntropin stimulation test. She does not like taking steroid. Her symptoms of dizziness might be due to her chronic adrenal insufficiency.  If she does not benefit with vestibular rehabilitation, we will recheck her morning cortisol level.

## 2017-01-12 ENCOUNTER — Ambulatory Visit (HOSPITAL_COMMUNITY): Payer: Medicare Other

## 2017-01-12 NOTE — Progress Notes (Signed)
Internal Medicine Clinic Attending  Case discussed with Dr. Amin at the time of the visit.  We reviewed the resident's history and exam and pertinent patient test results.  I agree with the assessment, diagnosis, and plan of care documented in the resident's note.    

## 2017-01-25 ENCOUNTER — Ambulatory Visit (HOSPITAL_COMMUNITY)
Admission: RE | Admit: 2017-01-25 | Discharge: 2017-01-25 | Disposition: A | Discharge status: Home / Self Care | Payer: Medicare Other | Source: Ambulatory Visit | Attending: Internal Medicine | Admitting: Internal Medicine

## 2017-01-25 DIAGNOSIS — R600 Localized edema: Secondary | ICD-10-CM | POA: Diagnosis not present

## 2017-01-25 DIAGNOSIS — Z720 Tobacco use: Secondary | ICD-10-CM | POA: Diagnosis not present

## 2017-01-25 DIAGNOSIS — I517 Cardiomegaly: Secondary | ICD-10-CM | POA: Insufficient documentation

## 2017-01-25 DIAGNOSIS — R42 Dizziness and giddiness: Secondary | ICD-10-CM | POA: Diagnosis not present

## 2017-01-25 DIAGNOSIS — I5189 Other ill-defined heart diseases: Secondary | ICD-10-CM | POA: Diagnosis not present

## 2017-01-25 DIAGNOSIS — E119 Type 2 diabetes mellitus without complications: Secondary | ICD-10-CM | POA: Diagnosis not present

## 2017-01-31 ENCOUNTER — Ambulatory Visit (INDEPENDENT_AMBULATORY_CARE_PROVIDER_SITE_OTHER): Payer: Medicare Other | Admitting: Endocrinology

## 2017-01-31 ENCOUNTER — Other Ambulatory Visit: Payer: Self-pay | Admitting: Internal Medicine

## 2017-01-31 ENCOUNTER — Encounter: Payer: Self-pay | Admitting: Endocrinology

## 2017-01-31 VITALS — BP 128/78 | HR 93 | Ht 70.0 in | Wt 331.6 lb

## 2017-01-31 DIAGNOSIS — E1165 Type 2 diabetes mellitus with hyperglycemia: Secondary | ICD-10-CM | POA: Diagnosis not present

## 2017-01-31 DIAGNOSIS — E1142 Type 2 diabetes mellitus with diabetic polyneuropathy: Secondary | ICD-10-CM

## 2017-01-31 DIAGNOSIS — IMO0002 Reserved for concepts with insufficient information to code with codable children: Secondary | ICD-10-CM

## 2017-01-31 DIAGNOSIS — R600 Localized edema: Secondary | ICD-10-CM

## 2017-01-31 DIAGNOSIS — Z794 Long term (current) use of insulin: Secondary | ICD-10-CM | POA: Diagnosis not present

## 2017-01-31 MED ORDER — GLUCOSE BLOOD VI STRP: ORAL_STRIP | 3 refills | Status: DC

## 2017-01-31 NOTE — Telephone Encounter (Signed)
Seems to take for LE edema, ? Chronic venous insufficiency.  Will provide enough refills to get patient to follow-up visit with PCP on 05/04/2017 where the appropriateness of continued lasix for this indication can be assessed.

## 2017-01-31 NOTE — Patient Instructions (Addendum)
Check blood sugars on waking up  3/7 days  Also check blood sugars about 2 hours after a meal and do this after different meals by rotation  Recommended blood sugar levels on waking up is 90-130 and about 2 hours after meal is 130-160  Please bring your blood sugar monitor to each visit, thank you  May adjust Novolog based on meal size

## 2017-01-31 NOTE — Progress Notes (Signed)
Patient ID: [name] [name], female   DOB: 01-Sep-1975, 41 y.o.   MRN: [id number]            Reason for Appointment:  Follow-up for Type 2 Diabetes    History of Present Illness:          Date of diagnosis of type 2 diabetes mellitus: 1999       Background history:   She had significantly  blood sugars at onset and was started on insulin within the first 3-4 months She thinks she has had difficulty controlling her diabetes for some time A few years ago metformin 1000 mg daily was added Her records indicate that her A1c has been markedly increased with only one A1c below 9% in 2015. During her admission in 12/2014 her sugar was over 610 and she was felt to have ketoacidosis  Recent history:   INSULIN regimen is: 40 Lantus bid , Novolog  18 units ac 1-3 times a day  Non-insulin hypoglycemic drugs the patient is taking are: Invokana 300 mg daily, Victoza 1.8 mg daily   Her A1c as of 7/18 increased at 11.3, has been as low as 7.2  Current management, blood sugar patterns and problems identified:  She has been irregular with her follow-up, medications, insulin, diet and medications for several months  On her last visit she was told to resume her [facility]  Also she was told to increase her insulin which she was taking only once a day instead of the previous twice a day regimen  She will suggested 30 units Lantus twice a day but is not taking her original dose 40 units twice a day on her own  She is checking her blood sugar very sporadically  However in the last week she is trying to be more compliant with doing her diet as directed, she says she is trying to cut back on carbohydrates although not consistently  FASTING readings on the download are difficult to determine as she is checking at different times and has only 3 readings in the mornings  Also consistent patterns of blood sugars after supper which she is doing more often now  Even with her blood sugars coming down  significantly however her weight has not changed  Does not report any hypoglycemia at any time       Side effects from medications have been: She gets nausea and loose stools with even 500 mg metformin  Compliance with the medical regimen: Fair  Hypoglycemia:   never  Glucose monitoring:  done 1-2 times a day         Glucometer:  previously Accu-Chek       Blood Glucose readings   Mean values apply above for all meters except median for One Touch  PRE-MEAL Fasting Lunch Dinner Bedtime Overall  Glucose range: 134-180 160, 218 161 116-233   Mean/median:    165 170   ?  Self-care: The diet that the patient has been following is: tries to limit fatsAnd recently carbohydrates .       Typical meal intake: Breakfast is egg, lean meat,bread.  Generally has little fried Foods, sugar free drinks, avoiding snacks.                 Dietician visit, most recent: 12/17               Exercise: some Walking ; limited by neuropathy, knee pain  Weight history:  Wt Readings from Last 3 Encounters:  01/31/17 (!) 331  lb 9.6 oz (150.4 kg)  01/09/17 (!) 338 lb (153.3 kg)  12/08/16 (!) 332 lb (150.6 kg)    Glycemic control:   Lab Results  Component Value Date   HGBA1C 11.3 (H) 11/21/2016   HGBA1C 8.1 08/05/2016   HGBA1C 7.2 05/12/2016   Lab Results  Component Value Date   MICROALBUR 14.7 (H) 03/16/2016   LDLCALC Comment 09/07/2016   CREATININE 1.09 01/02/2017   Lab Results  Component Value Date   MICRALBCREAT 24.4 03/16/2016        Allergies as of 01/31/2017      Reactions   Vancomycin Other (See Comments)   Acute renal failure suspected secondary to vanco      Medication List       Accurate as of 01/31/17  8:46 PM. Always use your most recent med list.          ACCU-CHEK AVIVA PLUS w/Device Kit 1 each by Does not apply route 4 (four) times daily.   ACCU-CHEK FASTCLIX LANCETS Misc Check blood sugar 3 times a day before meals   amitriptyline 50 MG tablet Commonly  known as:  ELAVIL Take 1 tablet (50 mg total) by mouth at bedtime.   atorvastatin 20 MG tablet Commonly known as:  LIPITOR Take 1 tablet (20 mg total) by mouth daily.   furosemide 20 MG tablet Commonly known as:  LASIX Take 1 tablet (20 mg total) by mouth daily as needed for edema.   glucose blood test strip Commonly known as:  ACCU-CHEK AVIVA PLUS Check blood sugar 4 times a day before meals   Insulin Pen Needle 31G X 5 MM Misc Commonly known as:  B-D UF III MINI PEN NEEDLES Use 3 per day to inject Insulin   Insulin Syringe-Needle U-100 31G X 15/64" 0.5 ML Misc Use to inject insulin as isntructed   INVOKANA 300 MG Tabs tablet Generic drug:  canagliflozin Take 1 tablet by mouth daily before breakfast   LANTUS 100 UNIT/ML injection Generic drug:  insulin glargine Inject 44 units below the skin at bedtime   liraglutide 18 MG/3ML Sopn Commonly known as:  VICTOZA Inject 0.3 mLs (1.8 mg total) into the skin daily.   NOVOLOG FLEXPEN 100 UNIT/ML FlexPen Generic drug:  insulin aspart Inject 14 units below the skin 3 times daily with meals   pregabalin 100 MG capsule Commonly known as:  LYRICA Take 1 capsule (100 mg total) by mouth 3 (three) times daily.            Discharge Care Instructions        Start     Ordered   01/31/17 0000  glucose blood (ACCU-CHEK AVIVA PLUS) test strip     01/31/17 1131   01/31/17 0000  Hemoglobin A1c     01/31/17 1131   01/31/17 6759  Basic metabolic panel     16/38/46 1131   01/31/17 0000  Lipid panel     01/31/17 1131      Allergies:  Allergies  Allergen Reactions  . Vancomycin Other (See Comments)    Acute renal failure suspected secondary to vanco    Past Medical History:  Diagnosis Date  . Abnormal Pap smear of cervix 2009  . Abscess    history of multiple abscesses  . Anemia of chronic disease 2002  . Anxiety   . Bipolar disorder ([place])   . Cellulitis 05/21/2014   right eye  . Chronic bronchitis ([place])    "get  it q yr" (05/13/2013)  .  Chronic pain   . Depression   . Edema of lower extremity   . Endocarditis 2002   subacute bacterial endocarditis.   . Family history of anesthesia complication    "my mom has a hard time coming out from under"  . GERD (gastroesophageal reflux disease)   . Heart murmur   . History of blood transfusion    "just low blood count" (05/13/2013)  . Hyperlipidemia   . Hypertension   . Hypothyroidism   . Hypothyroidism, adult 03/21/2014  . Obesity   . OSA on CPAP   . Peripheral neuropathy   . Type II diabetes mellitus ([place])     Past Surgical History:  Procedure Laterality Date  . INCISION AND DRAINAGE ABSCESS     multiple I&Ds  . INCISION AND DRAINAGE ABSCESS Left 07/09/2012   Procedure: DRESSING CHANGE, THIGH WOUND;  Surgeon: Harl Bowie, MD;  Location: [place];  Service: General;  Laterality: Left;  . INCISION AND DRAINAGE OF WOUND Left 07/07/2012   Procedure: IRRIGATION AND DEBRIDEMENT WOUND;  Surgeon: Harl Bowie, MD;  Location: [place];  Service: General;  Laterality: Left;  . INCISION AND DRAINAGE PERIRECTAL ABSCESS Left 07/14/2012   Procedure: DEBRIDEMENT OF SKIN & SOFT TISSUE; DRESSING CHANGE UNDER ANESTHESIA;  Surgeon: Gayland Curry, MD,FACS;  Location: [place];  Service: General;  Laterality: Left;  . INCISION AND DRAINAGE PERIRECTAL ABSCESS Left 07/16/2012   Procedure: I&D Left Thigh;  Surgeon: Gwenyth Ober, MD;  Location: [place];  Service: General;  Laterality: Left;  . INCISION AND DRAINAGE PERIRECTAL ABSCESS N/A 01/05/2015   Procedure: IRRIGATION AND DEBRIDEMENT PERIRECTAL ABSCESS;  Surgeon: Donnie Mesa, MD;  Location: [place];  Service: General;  Laterality: N/A;  . IRRIGATION AND DEBRIDEMENT ABSCESS Left 07/06/2012   Procedure: IRRIGATION AND DEBRIDEMENT ABSCESS BUTTOCKS AND THIGH;  Surgeon: Shann Medal, MD;  Location: [place];  Service: General;  Laterality: Left;  . IRRIGATION AND DEBRIDEMENT ABSCESS Left 08/10/2012   Procedure: IRRIGATION  AND DEBRIDEMENT ABSCESS;  Surgeon: Madilyn Hook, DO;  Location: [place];  Service: General;  Laterality: Left;  . TONSILLECTOMY  1994    Family History  Problem Relation Age of Onset  . Heart failure Mother   . Diabetes Mother   . Kidney disease Mother   . Kidney disease Father   . Diabetes Paternal Grandmother   . Heart failure Paternal Grandmother     Social History:  reports that she has been smoking Cigarettes.  She has a 3.00 pack-year smoking history. She has never used smokeless tobacco. She reports that she drinks alcohol. She reports that she does not use drugs.   Review of Systems  Lipid history: Has hypercholesterolemia, PCP has prescribed Lipitor 20 mg Not on any treatment for high triglycerides  Needs follow-up   Lab Results  Component Value Date   CHOL 198 09/07/2016   HDL 40 09/07/2016   LDLCALC Comment 09/07/2016   TRIG 423 (H) 09/07/2016   CHOLHDL 5.0 (H) 09/07/2016           Hypertension:Blood pressure has been treated with lisinopril HCT By PCP  She also takes Lasix 20 mg   BP Readings from Last 3 Encounters:  01/31/17 128/78  01/09/17 (!) 162/91  12/08/16 118/72     Most recent foot exam: 9/17  No recent report of eye exam available  LABS:  No visits with results within 1 Week(s) from this visit.  Latest known visit with results is:  Lab on  01/02/2017  Component Date Value Ref Range Status  . Sodium 01/02/2017 138  135 - 145 mEq/L Final  . Potassium 01/02/2017 4.5  3.5 - 5.1 mEq/L Final  . Chloride 01/02/2017 102  96 - 112 mEq/L Final  . CO2 01/02/2017 29  19 - 32 mEq/L Final  . Glucose, Bld 01/02/2017 197* 70 - 99 mg/dL Final  . BUN 01/02/2017 23  6 - 23 mg/dL Final  . Creatinine, Ser 01/02/2017 1.09  0.40 - 1.20 mg/dL Final  . Calcium 01/02/2017 9.4  8.4 - 10.5 mg/dL Final  . GFR 01/02/2017 71.31  >60.00 mL/min Final  . Fructosamine 01/02/2017 259  0 - 285 umol/L Final   Comment: Published reference interval for apparently healthy  subjects between age 49 and 6 is 77 - 285 umol/L and in a poorly controlled diabetic population is 228 - 563 umol/L with a mean of 396 umol/L.     Physical Examination:  BP 128/78   Pulse 93   Ht '5\' 10"'  (1.778 m)   Wt (!) 331 lb 9.6 oz (150.4 kg)   SpO2 94%   BMI 47.58 kg/m        ASSESSMENT:  Diabetes type 2, Obese,  uncontrolled  See history of present illness for detailed discussion of current diabetes management, blood sugar patterns and problems identified  Her blood sugars are Improving and more significantly within target range in the last couple of weeks This is from her being compliant with her insulin as directed, increasing her basal insulin, taking Victoza and Invokana regularly and more recently trying to be committed to watching her diet with carbohydrates and portions However still has sporadic readings over 200 based on her diet Currently not able to exercise but has not gained any weight with improved glucose control  No recent objective labs available to assess her control, last fructosamine was however fairly good at 259 last month   HYPERTENSION: Blood pressure  normal today  High triglycerides: Will need follow-up fasting lipids    PLAN:     Increase Frequency of glucose monitoring  Needs to do fasting readings consistently to help adjust Lantus  He probably needs to vary the dose of NovoLog +/-2-4 units at suppertime based on how much carbohydrates she is getting and meal size  A1c on the next visit  Patient Instructions  Check blood sugars on waking up  3/7 days  Also check blood sugars about 2 hours after a meal and do this after different meals by rotation  Recommended blood sugar levels on waking up is 90-130 and about 2 hours after meal is 130-160  Please bring your blood sugar monitor to each visit, thank you  May adjust Novolog based on meal size      Daivion Pape 01/31/2017, 8:46 PM   Note: This office note was prepared  with Estate agent. Any transcriptional errors that result from this process are unintentional.

## 2017-02-01 ENCOUNTER — Other Ambulatory Visit: Payer: Self-pay | Admitting: *Deleted

## 2017-02-01 ENCOUNTER — Other Ambulatory Visit: Payer: Self-pay

## 2017-02-01 MED ORDER — INSULIN GLARGINE 100 UNIT/ML ~~LOC~~ SOLN: SUBCUTANEOUS | 1 refills | Status: DC

## 2017-02-01 NOTE — Telephone Encounter (Signed)
Requesting refill on amitriptyline (ELAVIL) 50 MG tablet, and Flexeril. Please call pt back.

## 2017-02-03 ENCOUNTER — Telehealth: Payer: Self-pay | Admitting: Internal Medicine

## 2017-02-03 NOTE — Telephone Encounter (Signed)
   Reason for call:   I received a call from [name] at 11:30 AM . Attempted to call patient back x3 without answer. Left voicemail to page back.    Pertinent Data:   Attempted call back x3 without answer. VM left.    Assessment / Plan / Recommendations:   Unable to reach patient for call back   Maryellen Pile, MD   02/03/2017, 11:30 AM

## 2017-02-08 ENCOUNTER — Other Ambulatory Visit: Payer: Self-pay | Admitting: *Deleted

## 2017-02-08 NOTE — Telephone Encounter (Signed)
Can you clarify the dose of novolog she is supposed to be on?

## 2017-02-09 ENCOUNTER — Ambulatory Visit (INDEPENDENT_AMBULATORY_CARE_PROVIDER_SITE_OTHER): Payer: Medicare Other | Admitting: Internal Medicine

## 2017-02-09 VITALS — BP 163/87 | HR 94 | Temp 98.4°F | Wt 346.5 lb

## 2017-02-09 DIAGNOSIS — Z841 Family history of disorders of kidney and ureter: Secondary | ICD-10-CM

## 2017-02-09 DIAGNOSIS — Z833 Family history of diabetes mellitus: Secondary | ICD-10-CM | POA: Diagnosis not present

## 2017-02-09 DIAGNOSIS — Z79899 Other long term (current) drug therapy: Secondary | ICD-10-CM

## 2017-02-09 DIAGNOSIS — I110 Hypertensive heart disease with heart failure: Secondary | ICD-10-CM | POA: Diagnosis not present

## 2017-02-09 DIAGNOSIS — F17210 Nicotine dependence, cigarettes, uncomplicated: Secondary | ICD-10-CM

## 2017-02-09 DIAGNOSIS — I5030 Unspecified diastolic (congestive) heart failure: Secondary | ICD-10-CM | POA: Diagnosis not present

## 2017-02-09 DIAGNOSIS — Z794 Long term (current) use of insulin: Secondary | ICD-10-CM

## 2017-02-09 DIAGNOSIS — R600 Localized edema: Secondary | ICD-10-CM

## 2017-02-09 DIAGNOSIS — E1142 Type 2 diabetes mellitus with diabetic polyneuropathy: Secondary | ICD-10-CM

## 2017-02-09 DIAGNOSIS — Z8249 Family history of ischemic heart disease and other diseases of the circulatory system: Secondary | ICD-10-CM | POA: Diagnosis not present

## 2017-02-09 DIAGNOSIS — Z23 Encounter for immunization: Secondary | ICD-10-CM

## 2017-02-09 DIAGNOSIS — I10 Essential (primary) hypertension: Secondary | ICD-10-CM

## 2017-02-09 DIAGNOSIS — IMO0002 Reserved for concepts with insufficient information to code with codable children: Secondary | ICD-10-CM

## 2017-02-09 DIAGNOSIS — E1165 Type 2 diabetes mellitus with hyperglycemia: Secondary | ICD-10-CM

## 2017-02-09 MED ORDER — FUROSEMIDE 20 MG PO TABS: 40.0000 mg | ORAL_TABLET | Freq: Every day | ORAL | 0 refills | Status: DC | PRN

## 2017-02-09 MED ORDER — "INSULIN SYRINGE-NEEDLE U-100 31G X 15/64"" 0.5 ML MISC": 6 refills | Status: DC

## 2017-02-09 MED ORDER — CYCLOBENZAPRINE HCL 10 MG PO TABS: 10.0000 mg | ORAL_TABLET | Freq: Three times a day (TID) | ORAL | 0 refills | Status: DC | PRN

## 2017-02-09 MED ORDER — AMITRIPTYLINE HCL 50 MG PO TABS: 50.0000 mg | ORAL_TABLET | Freq: Every day | ORAL | 3 refills | Status: DC

## 2017-02-09 NOTE — Assessment & Plan Note (Signed)
Patient followed by endocrinologist Dr. Dwyane Dee.  She was seen on 01/31/17 and reported she was taking 40 units of lantus BID and 17 units of humalog at meals.  Today, she says she is taking 40 units BID of lantus and 14 units of humalog at meals.  She also takes invokana, and victoza.  She has a recent A1C about two months ago that was elevated at 11.3.  I asked if she had her meter or has been taking her blood sugar at home and she says she has one but not with her and she has not been checking.    -Emphasized importance of taking blood glucose measurements for [place] to alter her therapy for optimum treatment.   -refilled her medications for diabetes listed above, will continue current regiment for now.

## 2017-02-09 NOTE — Patient Instructions (Addendum)
Your victoza, invokana, novolog has been refilled.  I also added flexeril today 10mg  tablets.  Please take as needed for your muscle cramps but try and take this at night because it can cause drowsiness.  We have increased your dosage of lasix to 40mg  daily.  You will take two of your 20mg  tablets now per day instead of one and let us know if you feel like the swelling is being relieved over the next week or if it is getting worse.  Continue your insulin regimen you discussed with Dr. Dwyane Dee for the time being and we will adjust as needed once you have some more readings on your meter.

## 2017-02-09 NOTE — Progress Notes (Signed)
CC: Pain in hands, feet, back she is unsure if it is neuropathy, lower extremity swelling, HPI:  [name] is a 41 y.o. female comes in for the above chief complaints.  She would like to additionally talk about the results of her echocardiogram she had done on the 5th of this month and discuss her hypertension and diabetes.    Please see A&P for status of the patient's chronic medical conditions  Past Medical History:  Diagnosis Date  . Abnormal Pap smear of cervix 2009  . Abscess    history of multiple abscesses  . Anemia of chronic disease 2002  . Anxiety   . Bipolar disorder ([place])   . Cellulitis 05/21/2014   right eye  . Chronic bronchitis ([place])    "get it q yr" (05/13/2013)  . Chronic pain   . Depression   . Edema of lower extremity   . Endocarditis 2002   subacute bacterial endocarditis.   . Family history of anesthesia complication    "my mom has a hard time coming out from under"  . GERD (gastroesophageal reflux disease)   . Heart murmur   . History of blood transfusion    "just low blood count" (05/13/2013)  . Hyperlipidemia   . Hypertension   . Hypothyroidism   . Hypothyroidism, adult 03/21/2014  . Obesity   . OSA on CPAP   . Peripheral neuropathy   . Type II diabetes mellitus ([place])    Review of Systems:  ROS: Pulmonary: pt denies increased work of breathing, shortness of breath,  Cardiac: pt denies palpitations, chest pain,  Abdominal: pt denies abdominal pain, nausea, vomiting, or diarrhea  Physical Exam:  Vitals:   02/09/17 1315  BP: (!) 163/87  Pulse: 94  Temp: 98.4 F (36.9 C)  TempSrc: Oral  SpO2: 100%  Weight: (!) 346 lb 8 oz (157.2 kg)   Physical Exam  Constitutional: She appears well-developed and well-nourished.  HENT:  Head: Normocephalic and atraumatic.  Eyes: Right eye exhibits no discharge. Left eye exhibits no discharge. No scleral icterus.  Cardiovascular: Normal rate, regular rhythm and normal heart sounds.  Exam  reveals no gallop and no friction rub.   No murmur heard. Pulmonary/Chest: Effort normal and breath sounds normal. She has no wheezes.  Abdominal: Soft. Bowel sounds are normal. She exhibits no distension. There is no tenderness.  Musculoskeletal: She exhibits edema (bilateral 2+ LE). She exhibits no tenderness.  Skin: Skin is warm and dry.  Psychiatric: Her speech is normal and behavior is normal. Judgment and thought content normal. She exhibits a depressed mood.    Social History   Social History  . Marital status: Single    Spouse name: N/A  . Number of children: 0  . Years of education: 3   Occupational History  . disability    Social History Main Topics  . Smoking status: Current Every Day Smoker    Packs/day: 0.50    Years: 6.00    Types: Cigarettes  . Smokeless tobacco: Never Used     Comment: Cutting back  . Alcohol use 0.0 oz/week     Comment: Socially.  . Drug use: No  . Sexual activity: Yes   Other Topics Concern  . Not on file   Social History Narrative   Lives with mother currently, goddaughter and uncle in a one story home.     On disability for diabetes, neuropathy, sleep apnea etc.  Disability since 2014.    Education: 11th grade.  Family History  Problem Relation Age of Onset  . Heart failure Mother   . Diabetes Mother   . Kidney disease Mother   . Kidney disease Father   . Diabetes Paternal Grandmother   . Heart failure Paternal Grandmother     Assessment & Plan:   See Encounters Tab for problem based charting.  Patient seen with Dr. Evette Doffing

## 2017-02-09 NOTE — Telephone Encounter (Signed)
Per patient- takes 14 units TID. Can not remember dosage being changed & has never taken 17 units.  Has appt in [facility] today @ 1:15pm.

## 2017-02-09 NOTE — Assessment & Plan Note (Signed)
Pt is complaining of neuropathic pain today and asks if she can get help with this issue.  She has been on gabapentin chronically in the past however, she has been switched to lyrica by her neurologist and has recently been switched to the maximum dosage.  She understands that she was just placed on the maximum dosage and it may take around a month for it to become therapeutic.    -continue lyrica 100mg  TID

## 2017-02-09 NOTE — Assessment & Plan Note (Addendum)
Blood pressure today was 163/87.  In contrast to the prior assessment and plan note pt that states lisinopril was discontinued she was taking the lisinopril 20mg  since her last visit.  She states she took it this morning as well as her lasix 20mg .    -Continue lisinopril 20mg  -lasix was increased from 20mg  to 40mg  daily -reevaluate during next visit in a few weeks.

## 2017-02-09 NOTE — Telephone Encounter (Signed)
Refilled today in office visit.

## 2017-02-09 NOTE — Assessment & Plan Note (Signed)
Shared with patient the results of her ECHO demonstrating grade 2 diastolic dysfunction with 60% EF and what this meant physiologically.  Explained to her how this relates to her lower extremity edema and what we could do to optimize her management of this condition.    -increased lasix to 40mg  daily.

## 2017-02-09 NOTE — Assessment & Plan Note (Signed)
Based on ECHO results as mentioned above pt has HFpEF with G2DD and this is likely the reason for her increased lower extremity edema.   -increased lasix to 40mg  daily

## 2017-02-10 ENCOUNTER — Other Ambulatory Visit: Payer: Self-pay | Admitting: Endocrinology

## 2017-02-10 NOTE — Progress Notes (Signed)
Internal Medicine Clinic Attending  I saw and evaluated the patient.  I personally confirmed the key portions of the history and exam documented by Dr. Winfrey and I reviewed pertinent patient test results.  The assessment, diagnosis, and plan were formulated together and I agree with the documentation in the resident's note. 

## 2017-02-13 ENCOUNTER — Ambulatory Visit (INDEPENDENT_AMBULATORY_CARE_PROVIDER_SITE_OTHER): Payer: Medicare Other | Admitting: Internal Medicine

## 2017-02-13 ENCOUNTER — Encounter: Payer: Self-pay | Admitting: Internal Medicine

## 2017-02-13 VITALS — BP 165/86 | HR 101 | Temp 98.1°F | Ht 70.0 in | Wt 349.5 lb

## 2017-02-13 DIAGNOSIS — M25562 Pain in left knee: Secondary | ICD-10-CM

## 2017-02-13 DIAGNOSIS — E1142 Type 2 diabetes mellitus with diabetic polyneuropathy: Secondary | ICD-10-CM

## 2017-02-13 DIAGNOSIS — E1165 Type 2 diabetes mellitus with hyperglycemia: Secondary | ICD-10-CM

## 2017-02-13 DIAGNOSIS — G8929 Other chronic pain: Secondary | ICD-10-CM | POA: Diagnosis not present

## 2017-02-13 DIAGNOSIS — E118 Type 2 diabetes mellitus with unspecified complications: Secondary | ICD-10-CM

## 2017-02-13 DIAGNOSIS — F17210 Nicotine dependence, cigarettes, uncomplicated: Secondary | ICD-10-CM

## 2017-02-13 DIAGNOSIS — M545 Low back pain, unspecified: Secondary | ICD-10-CM

## 2017-02-13 DIAGNOSIS — M25561 Pain in right knee: Secondary | ICD-10-CM

## 2017-02-13 HISTORY — DX: Low back pain, unspecified: M54.50

## 2017-02-13 MED ORDER — DICLOFENAC SODIUM 1 % TD GEL: 4.0000 g | Freq: Four times a day (QID) | TRANSDERMAL | 0 refills | Status: DC

## 2017-02-13 NOTE — Patient Instructions (Addendum)
[name] [name],  I am sorry you are in so much pain.   Take Flexeril 10 mg up to three times daily for your back pain.   I have also sent Voltaren gel to your pharmacy. You can apply this to your back and your knees for pain, up to four times daily.   I have also put in a referral for physical therapy for your back and knee pain, which I think you will greatly benefit from.  Return to clinic in 2-4 weeks if your symptoms do not improve.

## 2017-02-13 NOTE — Assessment & Plan Note (Signed)
Patient needs referral for diabetic eye exam. She no showed three consecutive times and now needs to be referred to a new practice.

## 2017-02-13 NOTE — Progress Notes (Signed)
   CC: back pain  HPI:  [name] is a 41 y.o. female with past medical history as documented below presenting for low back pain for 2-3 weeks. She states the pain is achy and hot in quality, it is localized to her lower back with no laterality, and it is non-radiating. She states she has chronic back pain but it acutely worsening 2-3 weeks ago with no trauma, inciting event, or change in activity. She states the pain is constant and is 10/10 in severity. She states it is worse with standing and walking, and hurts when she rises from a seated position to standing. She has tried ibuprofen and tylenol, which do not alleviate her symptoms. She states that a hot bath or hot shower will help ease the pain. She denies fever, chills, numbness, tingling, or bowel/bladder incontinence. She states she is on Lyrica and amitriptyline for nerve pain, which have not alleviated her back pain.   Past Medical History:  Diagnosis Date  . Abnormal Pap smear of cervix 2009  . Abscess    history of multiple abscesses  . Anemia of chronic disease 2002  . Anxiety   . Bipolar disorder ([place])   . Cellulitis 05/21/2014   right eye  . Chronic bronchitis ([place])    "get it q yr" (05/13/2013)  . Chronic pain   . Depression   . Edema of lower extremity   . Endocarditis 2002   subacute bacterial endocarditis.   . Family history of anesthesia complication    "my mom has a hard time coming out from under"  . GERD (gastroesophageal reflux disease)   . Heart murmur   . History of blood transfusion    "just low blood count" (05/13/2013)  . Hyperlipidemia   . Hypertension   . Hypothyroidism   . Hypothyroidism, adult 03/21/2014  . Obesity   . OSA on CPAP   . Peripheral neuropathy   . Type II diabetes mellitus ([place])    Review of Systems:   Review of Systems  Constitutional: Negative for chills and fever.  Respiratory: Negative for shortness of breath.   Cardiovascular: Positive for leg swelling.  Negative for chest pain.  Gastrointestinal: Negative.   Genitourinary: Negative.   Musculoskeletal: Positive for back pain and joint pain (knees bilaterally).  Neurological: Negative for tingling, sensory change, focal weakness and weakness.    Physical Exam:  Vitals:   02/13/17 1333  BP: (!) 165/86  Pulse: (!) 101  Temp: 98.1 F (36.7 C)  TempSrc: Oral  SpO2: 99%  Weight: (!) 349 lb 8 oz (158.5 kg)  Height: 5\' 10"  (1.778 m)   Physical Exam  Constitutional: She is oriented to person, place, and time. She appears well-developed and well-nourished.  HENT:  Head: Normocephalic and atraumatic.  Neck: Normal range of motion. Neck supple.  Cardiovascular: Normal rate, regular rhythm and normal heart sounds.   Musculoskeletal: She exhibits edema (peripheral edema with TTP bilateral lower extremities ).       Lumbar back: She exhibits tenderness (TTP over paraspinal musculature bilaterally ). She exhibits normal range of motion, no swelling, no edema and no deformity.  Neurological: She is alert and oriented to person, place, and time. She has normal strength. No cranial nerve deficit or sensory deficit.    Assessment & Plan:   See Encounters Tab for problem based charting.  Patient seen with Dr. Dareen Piano

## 2017-02-13 NOTE — Assessment & Plan Note (Signed)
Patient's subacute lumbar back pain is most likely muscular in origin. It is localized to the lumbar paraspinal musculature and is non-radiating. She has no red flag symptoms such as fever, chills, bony tenderness, numbness, or focal weakness on neurologic exam. Her lower extremity strength is 5/5 bilaterally.   Plan: -Flexeril 10 mg TID -Voltaren gel QID -Physical therapy referral -RTC if symptoms fail to improve in 2-4 weeks

## 2017-02-21 ENCOUNTER — Telehealth: Payer: Self-pay

## 2017-02-21 NOTE — Telephone Encounter (Signed)
Left vm requesting patient call back to discuss what meter she needs due to insurance

## 2017-02-23 ENCOUNTER — Encounter: Payer: Self-pay | Admitting: Physical Therapy

## 2017-02-23 ENCOUNTER — Ambulatory Visit: Payer: Medicare Other | Attending: Internal Medicine | Admitting: Physical Therapy

## 2017-02-23 DIAGNOSIS — R2689 Other abnormalities of gait and mobility: Secondary | ICD-10-CM | POA: Diagnosis not present

## 2017-02-23 DIAGNOSIS — M62830 Muscle spasm of back: Secondary | ICD-10-CM | POA: Insufficient documentation

## 2017-02-23 DIAGNOSIS — M545 Low back pain: Secondary | ICD-10-CM | POA: Diagnosis not present

## 2017-02-23 DIAGNOSIS — M6281 Muscle weakness (generalized): Secondary | ICD-10-CM | POA: Insufficient documentation

## 2017-02-23 DIAGNOSIS — G8929 Other chronic pain: Secondary | ICD-10-CM | POA: Diagnosis not present

## 2017-02-23 DIAGNOSIS — R296 Repeated falls: Secondary | ICD-10-CM | POA: Insufficient documentation

## 2017-02-23 NOTE — Therapy (Signed)
[place], [place], [address] Phone: [phone]   Fax:  [phone]  Physical Therapy Evaluation  Patient Details  Name: [name] [name] MRN: [id number] Date of Birth: Aug 24, 1975 Referring Provider: Aldine Contes, MD  Encounter Date: 02/23/2017      PT End of Session - 02/23/17 1739    Visit Number 1   Number of Visits 13   Date for PT Re-Evaluation 04/06/17   Authorization Type MCR: Kx mod by 15th visit, progress note by 10thvisit   PT Start Time 6948   PT Stop Time 1632   PT Time Calculation (min) 46 min   Activity Tolerance Patient tolerated treatment well;Patient limited by pain;Patient limited by fatigue   Behavior During Therapy [facility] for tasks assessed/performed      Past Medical History:  Diagnosis Date  . Abnormal Pap smear of cervix 2009  . Abscess    history of multiple abscesses  . Anemia of chronic disease 2002  . Anxiety   . Bipolar disorder ([place])   . Cellulitis 05/21/2014   right eye  . Chronic bronchitis ([place])    "get it q yr" (05/13/2013)  . Chronic pain   . Depression   . Edema of lower extremity   . Endocarditis 2002   subacute bacterial endocarditis.   . Family history of anesthesia complication    "my mom has a hard time coming out from under"  . GERD (gastroesophageal reflux disease)   . Heart murmur   . History of blood transfusion    "just low blood count" (05/13/2013)  . Hyperlipidemia   . Hypertension   . Hypothyroidism   . Hypothyroidism, adult 03/21/2014  . Obesity   . OSA on CPAP   . Peripheral neuropathy   . Type II diabetes mellitus ([place])     Past Surgical History:  Procedure Laterality Date  . INCISION AND DRAINAGE ABSCESS     multiple I&Ds  . INCISION AND DRAINAGE ABSCESS Left 07/09/2012   Procedure: DRESSING CHANGE, THIGH WOUND;  Surgeon: Harl Bowie, MD;  Location: [place];  Service: General;  Laterality: Left;  . INCISION AND  DRAINAGE OF WOUND Left 07/07/2012   Procedure: IRRIGATION AND DEBRIDEMENT WOUND;  Surgeon: Harl Bowie, MD;  Location: [place];  Service: General;  Laterality: Left;  . INCISION AND DRAINAGE PERIRECTAL ABSCESS Left 07/14/2012   Procedure: DEBRIDEMENT OF SKIN & SOFT TISSUE; DRESSING CHANGE UNDER ANESTHESIA;  Surgeon: Gayland Curry, MD,FACS;  Location: [place];  Service: General;  Laterality: Left;  . INCISION AND DRAINAGE PERIRECTAL ABSCESS Left 07/16/2012   Procedure: I&D Left Thigh;  Surgeon: Gwenyth Ober, MD;  Location: [place];  Service: General;  Laterality: Left;  . INCISION AND DRAINAGE PERIRECTAL ABSCESS N/A 01/05/2015   Procedure: IRRIGATION AND DEBRIDEMENT PERIRECTAL ABSCESS;  Surgeon: Donnie Mesa, MD;  Location: [place];  Service: General;  Laterality: N/A;  . IRRIGATION AND DEBRIDEMENT ABSCESS Left 07/06/2012   Procedure: IRRIGATION AND DEBRIDEMENT ABSCESS BUTTOCKS AND THIGH;  Surgeon: Shann Medal, MD;  Location: [place];  Service: General;  Laterality: Left;  . IRRIGATION AND DEBRIDEMENT ABSCESS Left 08/10/2012   Procedure: IRRIGATION AND DEBRIDEMENT ABSCESS;  Surgeon: Madilyn Hook, DO;  Location: [place];  Service: General;  Laterality: Left;  . TONSILLECTOMY  1994    There were no vitals filed for this visit.       Subjective Assessment - 02/23/17 1557    Subjective pt is a 41  y.o F with CC of low back pain that started about a year ago that started with no specific MOI. reports pain that starts in the neck and into the low back. pain and N/T starting at the knees and going down into the foot that started before the back started.  reports that her legs have been feeling weak and has reported mulitple falls over the last 6 months over 5. pt reports intermittent weakness and buckling but denies saddle paresthesis, or issues with incontence. Since onset she reports no changes.     Limitations Standing;Walking;House hold activities;Sitting;Lifting;Other (comment)   How long can you sit  comfortably? unlimited   How long can you stand comfortably? unsure but estimates 10 min   How long can you walk comfortably? unsure but estimates 10-15 min   Diagnostic tests No imaging performed on neck or back   Patient Stated Goals teach methods of how to deal with pain, reduce pain, improve mobility, decrease falls   Currently in Pain? Yes   Pain Score 0-No pain  5/10 coming into clinic, at worst 10/10   Pain Location Back   Pain Orientation Right;Left;Lower;Mid   Pain Descriptors / Indicators Pins and needles;Numbness;Heaviness;Burning;Tightness   Pain Type Chronic pain   Pain Radiating Towards both legs from the knees to the feet   Pain Onset More than a month ago   Pain Frequency Constant   Aggravating Factors  standing/ walking, bending forward   Pain Relieving Factors sitting, lyrica, shower using hot water            [facility] PT Assessment - 02/23/17 1609      Assessment   Medical Diagnosis Low back pain without sciatica   Referring Provider Aldine Contes, MD   Onset Date/Surgical Date --  1 year ago   Hand Dominance Right   Next MD Visit --  November 2018   Prior Therapy no     Precautions   Precautions None     Restrictions   Weight Bearing Restrictions No     Balance Screen   Has the patient fallen in the past 6 months Yes   How many times? 5   Has the patient had a decrease in activity level because of a fear of falling?  No   Is the patient reluctant to leave their home because of a fear of falling?  No     Home Social worker Private residence   Living Arrangements Parent   Available Help at Discharge Family;Available 24 hours/day   Type of Home House   Home Access Stairs to enter   Entrance Stairs-Number of Steps 2   Entrance Stairs-Rails Can reach both   Home Layout One level   Additional Comments doesn't have equipment grabs wall or furniture     Prior Function   Level of Independence Independent with basic ADLs    Vocation On disability   Leisure drawing     Cognition   Overall Cognitive Status Within Functional Limits for tasks assessed     Observation/Other Assessments   Focus on Therapeutic Outcomes (FOTO)  65% limited  predicted 48% limited     Posture/Postural Control   Posture/Postural Control Postural limitations   Postural Limitations Rounded Shoulders;Forward head     ROM / Strength   AROM / PROM / Strength AROM;Strength;PROM     AROM   AROM Assessment Site Lumbar   Lumbar Flexion 70  increased referral down the legs   Lumbar Extension 13  soreness in to the shoulder blades   Lumbar - Right Side Bend 12   Lumbar - Left Side Bend 12     Strength   Strength Assessment Site Hip;Knee   Right/Left Hip Right;Left   Right Hip Flexion 4/5   Right Hip Extension 4-/5   Right Hip ABduction 4/5   Right Hip ADduction 4-/5   Left Hip Flexion 4/5   Left Hip Extension 4-/5   Left Hip ABduction 4/5   Left Hip ADduction 4-/5   Right/Left Knee Right;Left   Right Knee Flexion 4+/5   Right Knee Extension 4+/5   Left Knee Flexion 4+/5   Left Knee Extension 4+/5     Palpation   Palpation comment TTP bil lumbar paraspinals            Objective measurements completed on examination: See above findings.                  PT Education - 02/23/17 1738    Education provided Yes   Education Details evaluation findings, POC, goals, HEP with form/ rationale. anatomy of the area involved   Person(s) Educated Patient   Methods Explanation;Verbal cues;Handout   Comprehension Verbalized understanding;Verbal cues required          PT Short Term Goals - 02/23/17 1750      PT SHORT TERM GOAL #1   Title pt to be I with inital HEP    Time 3   Period Weeks   Status New   Target Date 03/16/17     PT SHORT TERM GOAL #2   Title pt to verbalize/ demo proper posture with sitting, standing/walking and lifting mechanics to prevent/ reduce low back pain   Time 3   Period Weeks    Status New   Target Date 03/16/17     PT SHORT TERM GOAL #3   Title pt to increase walking/ standing to 20 min with LRAD for safety with </= 6/10 pain  and no report of buckling in the knees   Time 3   Period Weeks   Status New   Target Date 03/16/17           PT Long Term Goals - 02/23/17 1752      PT LONG TERM GOAL #1   Title pt to increase trunk mobility by >/= 8 degrees in all directions with </= 4/10 pain for functional mobility    Time 6   Period Weeks   Status New   Target Date 04/06/17     PT LONG TERM GOAL #2   Title pt to be able to walk/ stand >/= 30 min with LRAD for safety for in home and community ambulation with </= 4/10 pain   Time 6   Period Weeks   Status New     PT LONG TERM GOAL #3   Title pt will be able to perform ADLs with </= 4/10 pain and no report of buckling in the knees for 2 weeks.    Time 6   Period Weeks   Status New     PT LONG TERM GOAL #4   Title increase FOTO score to </= 48% limited to demo improvement in function   Time 6   Period Weeks   Status New   Target Date 04/06/17                Plan - 02/23/17 1740    Clinical Impression Statement pt presents to OPPT with CC of  low back pain with no specific MOI with intermittent referral down bil legs from knee to the foot. she denies red flag symptoms. Walking back to treatment room and pt demonstrate mulitple occurences where her knees were buckling, using the wall for stability. utilized clinic W/C to get pt back to treatment room.  limited trunk mobility with pain noted and end ranges with reproduction of sx with flexion. pt demonstrates functional strength in the LE but fatigues quickly with standing/ walking. limited evaluation due to pain, and fatigue. She would benefit from physical therapy to decrease low back pain, improve stability with walking/ standing, and improve trunk mobility and maximize her function by addressing the deficits listed. pt may benefit from further  imaging to further assess low back.    History and Personal Factors relevant to plan of care: hx or DM, HTN, hx of mulitiple surgeries, obesity    Clinical Presentation Unstable   Clinical Presentation due to: weakness in bil, limited trunk mobility, muscle tightness, LE N/T, buckling of the limbs   Clinical Decision Making High   Rehab Potential Good   PT Frequency 2x / week   PT Duration 6 weeks   PT Treatment/Interventions ADLs/Self Care Home Management;Cryotherapy;Electrical Stimulation;Iontophoresis 4mg /ml Dexamethasone;Moist Heat;Ultrasound;Traction;Therapeutic exercise;Therapeutic activities;Dry needling;Taping;Manual techniques;Neuromuscular re-education;Balance training;Passive range of motion;Patient/family education   PT Next Visit Plan review HEP and updated HEP PRN, trunk mobility, trial extension based treatment modalities PRN, hip / knee strengthening   PT Home Exercise Plan abdominal bracing, lower trunk rotation, prone on elbows, posterior pelvic tilt   Consulted and Agree with Plan of Care Patient      Patient will benefit from skilled therapeutic intervention in order to improve the following deficits and impairments:  Pain, Improper body mechanics, Decreased endurance, Decreased activity tolerance, Decreased mobility, Decreased range of motion, Abnormal gait, Obesity, Increased fascial restricitons, Increased muscle spasms, Hypomobility, Decreased knowledge of use of DME  Visit Diagnosis: Chronic bilateral low back pain, with sciatica presence unspecified - Plan: PT plan of care cert/re-cert  Muscle weakness (generalized) - Plan: PT plan of care cert/re-cert  Muscle spasm of back - Plan: PT plan of care cert/re-cert  Other abnormalities of gait and mobility - Plan: PT plan of care cert/re-cert  Repeated falls - Plan: PT plan of care cert/re-cert      G-Codes - 48/18/56 1755    Functional Assessment Tool Used (Outpatient Only) pain, ROM, strength, FOTO/ clinical  judgement    Functional Limitation Mobility: Walking and moving around   Mobility: Walking and Moving Around Current Status ([id number]) At least 60 percent but less than 80 percent impaired, limited or restricted   Mobility: Walking and Moving Around Goal Status [phone]) At least 40 percent but less than 60 percent impaired, limited or restricted       Problem List Patient Active Problem List   Diagnosis Date Noted  . Acute bilateral low back pain 02/13/2017  . Heart failure with preserved ejection fraction ([place]) 02/09/2017  . Jerking movements of extremities 12/08/2016  . Routine adult health maintenance 12/08/2016  . Chronic pain of right knee 09/08/2016  . Abdominal muscle pain 09/08/2016  . Vertigo 08/05/2016  . Lightheadedness 07/15/2016  . CKD (chronic kidney disease) stage 2, GFR 60-89 ml/min 07/15/2016  . Bilateral lower extremity edema 05/13/2016  . OSA on CPAP 05/13/2016  . Vaginal pruritus 03/14/2016  . Morbid obesity due to excess calories ([place]) 11/27/2015  . Callus of foot 11/26/2015  . History of Low serum cortisol level ([facility])  01/07/2015  . Financial difficulty 04/25/2013  . Uncontrolled type 2 diabetes mellitus ([place]) 07/12/2012  . Necrotizing fasciitis s/p OR debridements 07/06/2012  . Carpal tunnel syndrome, bilateral 01/25/2012  . Diabetic peripheral neuropathy ([place]) 07/04/2011  . Anxiety and depression 06/02/2011  . History of abnormal cervical Pap smear 12/13/2007  . GERD 12/06/2007  . Hyperlipidemia 08/29/2007  . Essential hypertension 08/29/2007   Starr [place] PT, DPT, LAT, ATC  02/23/17  5:57 PM      [facility] [address] [place], [place], [address] Phone: [phone]   Fax:  [phone]  Name: [name] [name] MRN: [id number] Date of Birth: 1976-01-13

## 2017-02-27 ENCOUNTER — Other Ambulatory Visit: Payer: Self-pay | Admitting: Neurology

## 2017-02-27 ENCOUNTER — Other Ambulatory Visit: Payer: Self-pay | Admitting: Internal Medicine

## 2017-02-27 DIAGNOSIS — E1142 Type 2 diabetes mellitus with diabetic polyneuropathy: Secondary | ICD-10-CM

## 2017-02-27 DIAGNOSIS — E785 Hyperlipidemia, unspecified: Secondary | ICD-10-CM

## 2017-02-27 NOTE — Telephone Encounter (Signed)
Has appt 10/12 [facility] and PCP appt Dec

## 2017-02-27 NOTE — Telephone Encounter (Signed)
Rx sent with note stating that no more refills will be given if patient misses her December appointment.

## 2017-03-03 ENCOUNTER — Ambulatory Visit: Payer: Self-pay

## 2017-03-07 ENCOUNTER — Encounter: Payer: Self-pay | Admitting: Internal Medicine

## 2017-03-07 ENCOUNTER — Encounter: Payer: Self-pay | Admitting: Physical Therapy

## 2017-03-07 ENCOUNTER — Other Ambulatory Visit: Payer: Self-pay | Admitting: Endocrinology

## 2017-03-07 ENCOUNTER — Other Ambulatory Visit: Payer: Self-pay | Admitting: Internal Medicine

## 2017-03-07 ENCOUNTER — Ambulatory Visit (INDEPENDENT_AMBULATORY_CARE_PROVIDER_SITE_OTHER): Payer: Medicare Other | Admitting: Internal Medicine

## 2017-03-07 VITALS — BP 143/73 | HR 100 | Temp 98.2°F | Ht 70.0 in | Wt 337.4 lb

## 2017-03-07 DIAGNOSIS — M545 Low back pain, unspecified: Secondary | ICD-10-CM

## 2017-03-07 DIAGNOSIS — E039 Hypothyroidism, unspecified: Secondary | ICD-10-CM | POA: Diagnosis not present

## 2017-03-07 DIAGNOSIS — G4733 Obstructive sleep apnea (adult) (pediatric): Secondary | ICD-10-CM | POA: Diagnosis not present

## 2017-03-07 DIAGNOSIS — E1142 Type 2 diabetes mellitus with diabetic polyneuropathy: Secondary | ICD-10-CM

## 2017-03-07 DIAGNOSIS — E785 Hyperlipidemia, unspecified: Secondary | ICD-10-CM | POA: Diagnosis not present

## 2017-03-07 DIAGNOSIS — I110 Hypertensive heart disease with heart failure: Secondary | ICD-10-CM | POA: Diagnosis not present

## 2017-03-07 DIAGNOSIS — I5032 Chronic diastolic (congestive) heart failure: Secondary | ICD-10-CM | POA: Diagnosis not present

## 2017-03-07 DIAGNOSIS — E1141 Type 2 diabetes mellitus with diabetic mononeuropathy: Secondary | ICD-10-CM

## 2017-03-07 NOTE — Progress Notes (Signed)
CC: Back pain  HPI:  [name] is a 41 y.o. female with PMH as listed below including T2DM, HTN, HFpEF, OSA on CPAP, and Obesity who presents for follow up of her back and leg pain.  Patient with chronic low back pain with associated bilateral leg pain to her feet. The pain is achy in nature with pins and needles sensation which comes and goes. She says she occasionally feels like she is "walking in water." She has been taking Lyrica 100 mg TID with some relief. She takes Amitriptyline at night which helps her sleep. She was given Flexeril on last visit which helps with occasional cramping pain and muscle spasms. She says she takes long hot showers which helps her pain. She is starting to work with PT. She reports occasional urine leak when she coughs, but no loss of bowel/bladder control, saddle anesthesia, focal weakness, or decreased strength.   Past Medical History:  Diagnosis Date  . Abnormal Pap smear of cervix 2009  . Abscess    history of multiple abscesses  . Anemia of chronic disease 2002  . Anxiety   . Bipolar disorder ([place])   . Cellulitis 05/21/2014   right eye  . Chronic bronchitis ([facility])    "get it q yr" (05/13/2013)  . Chronic pain   . Depression   . Edema of lower extremity   . Endocarditis 2002   subacute bacterial endocarditis.   . Family history of anesthesia complication    "my mom has a hard time coming out from under"  . GERD (gastroesophageal reflux disease)   . Heart murmur   . History of blood transfusion    "just low blood count" (05/13/2013)  . Hyperlipidemia   . Hypertension   . Hypothyroidism   . Hypothyroidism, adult 03/21/2014  . Obesity   . OSA on CPAP   . Peripheral neuropathy   . Type II diabetes mellitus ([place])    Review of Systems:   Review of Systems  Respiratory: Negative for shortness of breath.   Cardiovascular: Negative for chest pain.  Musculoskeletal: Positive for back pain.  Neurological: Positive for tingling.  Negative for dizziness, sensory change, focal weakness and loss of consciousness.     Physical Exam:  Vitals:   03/07/17 0934  BP: (!) 143/73  Pulse: 100  Temp: 98.2 F (36.8 C)  TempSrc: Oral  SpO2: 100%  Weight: (!) 337 lb 6.4 oz (153 kg)  Height: 5\' 10"  (1.778 m)   Physical Exam  Constitutional: She is oriented to person, place, and time. She appears well-developed and well-nourished. No distress.  Obese woman, no acute distress  HENT:  Head: Normocephalic and atraumatic.  Cardiovascular: Normal rate and regular rhythm.   No murmur heard. Pulmonary/Chest: Effort normal. No respiratory distress. She has no wheezes. She has no rales.  Musculoskeletal:       Cervical back: She exhibits no tenderness.       Thoracic back: She exhibits no tenderness.       Lumbar back: She exhibits no tenderness.  LE strength 5/5 against resistance with extension/flexion at the knees and foot dorsiflexion/plantarflexion bilaterally. Tingling sensation elicited at right foot with straight leg test on RLE. Normal on left.  Neurological: She is alert and oriented to person, place, and time.  Skin: She is not diaphoretic.  Psychiatric: She has a normal mood and affect.    Assessment & Plan:   See Encounters Tab for problem based charting.  Patient discussed with Dr.  Butcher  Diabetic peripheral neuropathy ([place]) Patient's symptoms seem consistent with neuropathic pain. She does seem to find some benefit from her Lyrica and Amitriptyline. She uses Flexeril as needed with relief of intense cramping which occurs about twice a day. She is not having any alarm symptoms to suggest spinal cord compression or cauda equina syndrome. I have recommended she continue her current medications as prescribed and continue to work with PT. She is encouraged to continue to work on dietary changes and weight loss as well.

## 2017-03-07 NOTE — Progress Notes (Signed)
Internal Medicine Clinic Attending  Case discussed with Dr. Patel at the time of the visit.  We reviewed the resident's history and exam and pertinent patient test results.  I agree with the assessment, diagnosis, and plan of care documented in the resident's note.  

## 2017-03-07 NOTE — Assessment & Plan Note (Signed)
Patient's symptoms seem consistent with neuropathic pain. She does seem to find some benefit from her Lyrica and Amitriptyline. She uses Flexeril as needed with relief of intense cramping which occurs about twice a day. She is not having any alarm symptoms to suggest spinal cord compression or cauda equina syndrome. I have recommended she continue her current medications as prescribed and continue to work with PT. She is encouraged to continue to work on dietary changes and weight loss as well.

## 2017-03-07 NOTE — Patient Instructions (Addendum)
It was a pleasure to see you [name] [name].  Please continue to work with PT for your back pain and neuropathy.  Please continue your current medications and work on dietary changes to help with weight loss and your diabetes.  Please follow up with Dr. Ronalee Red as scheduled or see [place] sooner if needed.  Diabetes Mellitus and Food It is important for you to manage your blood sugar (glucose) level. Your blood glucose level can be greatly affected by what you eat. Eating healthier foods in the appropriate amounts throughout the day at about the same time each day will help you control your blood glucose level. It can also help slow or prevent worsening of your diabetes mellitus. Healthy eating may even help you improve the level of your blood pressure and reach or maintain a healthy weight. General recommendations for healthful eating and cooking habits include:  Eating meals and snacks regularly. Avoid going long periods of time without eating to lose weight.  Eating a diet that consists mainly of plant-based foods, such as fruits, vegetables, nuts, legumes, and whole grains.  Using low-heat cooking methods, such as baking, instead of high-heat cooking methods, such as deep frying.  Work with your dietitian to make sure you understand how to use the Nutrition Facts information on food labels. How can food affect me? Carbohydrates Carbohydrates affect your blood glucose level more than any other type of food. Your dietitian will help you determine how many carbohydrates to eat at each meal and teach you how to count carbohydrates. Counting carbohydrates is important to keep your blood glucose at a healthy level, especially if you are using insulin or taking certain medicines for diabetes mellitus. Alcohol Alcohol can cause sudden decreases in blood glucose (hypoglycemia), especially if you use insulin or take certain medicines for diabetes mellitus. Hypoglycemia can be a life-threatening condition.  Symptoms of hypoglycemia (sleepiness, dizziness, and disorientation) are similar to symptoms of having too much alcohol. If your health care provider has given you approval to drink alcohol, do so in moderation and use the following guidelines:  Women should not have more than one drink per day, and men should not have more than two drinks per day. One drink is equal to: ? 12 oz of beer. ? 5 oz of wine. ? 1 oz of hard liquor.  Do not drink on an empty stomach.  Keep yourself hydrated. Have water, diet soda, or unsweetened iced tea.  Regular soda, juice, and other mixers might contain a lot of carbohydrates and should be counted.  What foods are not recommended? As you make food choices, it is important to remember that all foods are not the same. Some foods have fewer nutrients per serving than other foods, even though they might have the same number of calories or carbohydrates. It is difficult to get your body what it needs when you eat foods with fewer nutrients. Examples of foods that you should avoid that are high in calories and carbohydrates but low in nutrients include:  Trans fats (most processed foods list trans fats on the Nutrition Facts label).  Regular soda.  Juice.  Candy.  Sweets, such as cake, pie, doughnuts, and cookies.  Fried foods.  What foods can I eat? Eat nutrient-rich foods, which will nourish your body and keep you healthy. The food you should eat also will depend on several factors, including:  The calories you need.  The medicines you take.  Your weight.  Your blood glucose level.  Your  blood pressure level.  Your cholesterol level.  You should eat a variety of foods, including:  Protein. ? Lean cuts of meat. ? Proteins low in saturated fats, such as fish, egg whites, and beans. Avoid processed meats.  Fruits and vegetables. ? Fruits and vegetables that may help control blood glucose levels, such as apples, mangoes, and yams.  Dairy  products. ? Choose fat-free or low-fat dairy products, such as milk, yogurt, and cheese.  Grains, bread, pasta, and rice. ? Choose whole grain products, such as multigrain bread, whole oats, and brown rice. These foods may help control blood pressure.  Fats. ? Foods containing healthful fats, such as nuts, avocado, olive oil, canola oil, and fish.  Does everyone with diabetes mellitus have the same meal plan? Because every person with diabetes mellitus is different, there is not one meal plan that works for everyone. It is very important that you meet with a dietitian who will help you create a meal plan that is just right for you. This information is not intended to replace advice given to you by your health care provider. Make sure you discuss any questions you have with your health care provider. Document Released: 02/03/2005 Document Revised: 10/15/2015 Document Reviewed: 04/05/2013 Elsevier Interactive Patient Education  2017 [employer].

## 2017-03-08 NOTE — Telephone Encounter (Signed)
I Rx 30 on 10/8

## 2017-03-09 ENCOUNTER — Encounter: Payer: Self-pay | Admitting: Physical Therapy

## 2017-03-09 ENCOUNTER — Ambulatory Visit: Payer: Medicare Other | Admitting: Physical Therapy

## 2017-03-09 ENCOUNTER — Other Ambulatory Visit: Payer: Self-pay

## 2017-03-09 DIAGNOSIS — E1142 Type 2 diabetes mellitus with diabetic polyneuropathy: Secondary | ICD-10-CM

## 2017-03-09 DIAGNOSIS — R296 Repeated falls: Secondary | ICD-10-CM

## 2017-03-09 DIAGNOSIS — M6281 Muscle weakness (generalized): Secondary | ICD-10-CM | POA: Diagnosis not present

## 2017-03-09 DIAGNOSIS — G8929 Other chronic pain: Secondary | ICD-10-CM | POA: Diagnosis not present

## 2017-03-09 DIAGNOSIS — M62830 Muscle spasm of back: Secondary | ICD-10-CM | POA: Diagnosis not present

## 2017-03-09 DIAGNOSIS — R2689 Other abnormalities of gait and mobility: Secondary | ICD-10-CM | POA: Diagnosis not present

## 2017-03-09 DIAGNOSIS — M545 Low back pain: Secondary | ICD-10-CM | POA: Diagnosis not present

## 2017-03-09 NOTE — Telephone Encounter (Signed)
cyclobenzaprine (FLEXERIL) 10 MG tablet, refill request.

## 2017-03-09 NOTE — Therapy (Signed)
[name], [place], [address] Phone: [phone]   Fax:  [phone]  Physical Therapy Treatment  Patient Details  Name: [name] [name] MRN: [id number] Date of Birth: 04-May-1976 Referring Provider: Aldine Contes, MD  Encounter Date: 03/09/2017      PT End of Session - 03/09/17 0929    Visit Number 2   Number of Visits 13   Date for PT Re-Evaluation 04/06/17   Authorization Type MCR: Kx mod by 15th visit, progress note by 10thvisit   PT Start Time 0845   PT Stop Time 0935   PT Time Calculation (min) 50 min   Activity Tolerance Patient tolerated treatment well   Behavior During Therapy [facility] for tasks assessed/performed      Past Medical History:  Diagnosis Date  . Abnormal Pap smear of cervix 2009  . Abscess    history of multiple abscesses  . Anemia of chronic disease 2002  . Anxiety   . Bipolar disorder ([place])   . Cellulitis 05/21/2014   right eye  . Chronic bronchitis ([place])    "get it q yr" (05/13/2013)  . Chronic pain   . Depression   . Edema of lower extremity   . Endocarditis 2002   subacute bacterial endocarditis.   . Family history of anesthesia complication    "my mom has a hard time coming out from under"  . GERD (gastroesophageal reflux disease)   . Heart murmur   . History of blood transfusion    "just low blood count" (05/13/2013)  . Hyperlipidemia   . Hypertension   . Hypothyroidism   . Hypothyroidism, adult 03/21/2014  . Obesity   . OSA on CPAP   . Peripheral neuropathy   . Type II diabetes mellitus ([place])     Past Surgical History:  Procedure Laterality Date  . INCISION AND DRAINAGE ABSCESS     multiple I&Ds  . INCISION AND DRAINAGE ABSCESS Left 07/09/2012   Procedure: DRESSING CHANGE, THIGH WOUND;  Surgeon: Harl Bowie, MD;  Location: [place];  Service: General;  Laterality: Left;  . INCISION AND DRAINAGE OF WOUND Left 07/07/2012   Procedure:  IRRIGATION AND DEBRIDEMENT WOUND;  Surgeon: Harl Bowie, MD;  Location: [place];  Service: General;  Laterality: Left;  . INCISION AND DRAINAGE PERIRECTAL ABSCESS Left 07/14/2012   Procedure: DEBRIDEMENT OF SKIN & SOFT TISSUE; DRESSING CHANGE UNDER ANESTHESIA;  Surgeon: Gayland Curry, MD,FACS;  Location: [place];  Service: General;  Laterality: Left;  . INCISION AND DRAINAGE PERIRECTAL ABSCESS Left 07/16/2012   Procedure: I&D Left Thigh;  Surgeon: Gwenyth Ober, MD;  Location: [place];  Service: General;  Laterality: Left;  . INCISION AND DRAINAGE PERIRECTAL ABSCESS N/A 01/05/2015   Procedure: IRRIGATION AND DEBRIDEMENT PERIRECTAL ABSCESS;  Surgeon: Donnie Mesa, MD;  Location: [place];  Service: General;  Laterality: N/A;  . IRRIGATION AND DEBRIDEMENT ABSCESS Left 07/06/2012   Procedure: IRRIGATION AND DEBRIDEMENT ABSCESS BUTTOCKS AND THIGH;  Surgeon: Shann Medal, MD;  Location: [place];  Service: General;  Laterality: Left;  . IRRIGATION AND DEBRIDEMENT ABSCESS Left 08/10/2012   Procedure: IRRIGATION AND DEBRIDEMENT ABSCESS;  Surgeon: Madilyn Hook, DO;  Location: [place];  Service: General;  Laterality: Left;  . TONSILLECTOMY  1994    There were no vitals filed for this visit.      Subjective Assessment - 03/09/17 0843    Subjective "I am still getting the sensation of walking through water, and  it still feels heavy with cramping in the legs"    Currently in Pain? Yes   Pain Score 8    Pain Location Back   Pain Orientation Right;Left;Lower   Pain Type Chronic pain   Pain Onset More than a month ago   Pain Frequency Constant   Aggravating Factors  standing/ walking, bending forward   Pain Relieving Factors sitting, lyrica, shower using hot water                         [facility] Adult PT Treatment/Exercise - 03/09/17 0859      Lumbar Exercises: Stretches   Active Hamstring Stretch 2 reps;30 seconds   Single Knee to Chest Stretch 2 reps;30 seconds   Lower Trunk Rotation --   1 x 12    Prone on Elbows Stretch 5 reps;30 seconds     Lumbar Exercises: Supine   Heel Slides 10 reps  x 2 sets bil   Heel Slides Limitations verbal cues for breathing throughout exercise   Bent Knee Raise 10 reps  with verbal cues for ADIM   Other Supine Lumbar Exercises decompression position, posterior pelvic tilts 2 x 10 with 2 sec hold     Modalities   Modalities Moist Heat     Moist Heat Therapy   Number Minutes Moist Heat 10 Minutes   Moist Heat Location Lumbar Spine;Knee  bil knee     Manual Therapy   Manual Therapy Manual Traction   Manual Traction lumbar traction trial x 3 min  relief of bil knee and hip pain                PT Education - 03/09/17 0928    Education provided Yes   Education Details discussed benefits of positive thoughts and avoiding over thinking activity to prevent potention anxiety effecting function.    Person(s) Educated Patient   Methods Explanation;Verbal cues   Comprehension Verbalized understanding;Verbal cues required          PT Short Term Goals - 02/23/17 1750      PT SHORT TERM GOAL #1   Title pt to be I with inital HEP    Time 3   Period Weeks   Status New   Target Date 03/16/17     PT SHORT TERM GOAL #2   Title pt to verbalize/ demo proper posture with sitting, standing/walking and lifting mechanics to prevent/ reduce low back pain   Time 3   Period Weeks   Status New   Target Date 03/16/17     PT SHORT TERM GOAL #3   Title pt to increase walking/ standing to 20 min with LRAD for safety with </= 6/10 pain  and no report of buckling in the knees   Time 3   Period Weeks   Status New   Target Date 03/16/17           PT Long Term Goals - 02/23/17 1752      PT LONG TERM GOAL #1   Title pt to increase trunk mobility by >/= 8 degrees in all directions with </= 4/10 pain for functional mobility    Time 6   Period Weeks   Status New   Target Date 04/06/17     PT LONG TERM GOAL #2   Title pt to be  able to walk/ stand >/= 30 min with LRAD for safety for in home and community ambulation with </= 4/10 pain  Time 6   Period Weeks   Status New     PT LONG TERM GOAL #3   Title pt will be able to perform ADLs with </= 4/10 pain and no report of buckling in the knees for 2 weeks.    Time 6   Period Weeks   Status New     PT LONG TERM GOAL #4   Title increase FOTO score to </= 48% limited to demo improvement in function   Time 6   Period Weeks   Status New   Target Date 04/06/17               Plan - 03/09/17 0930    Clinical Impression Statement pt continues to reprot 8/10 pain but states she has been consistent with her HEP. She was able to stand up and walk 1/2way back to treatment area before she started using the wall to asisst with walking, discussed benefits of positive thought to prevent psychosomatic effects. focused on supine hip / core strengthening which she was able to perform but fatigues quickly. MHP post session to calm down pain.    PT Next Visit Plan review HEP and updated HEP PRN, trunk mobility, trial extension based treatment modalities PRN, hip / knee strengthening   PT Home Exercise Plan abdominal bracing, lower trunk rotation, prone on elbows, posterior pelvic tilt   Consulted and Agree with Plan of Care Patient      Patient will benefit from skilled therapeutic intervention in order to improve the following deficits and impairments:     Visit Diagnosis: Chronic bilateral low back pain, with sciatica presence unspecified  Muscle weakness (generalized)  Muscle spasm of back  Other abnormalities of gait and mobility  Repeated falls     Problem List Patient Active Problem List   Diagnosis Date Noted  . Acute bilateral low back pain 02/13/2017  . Heart failure with preserved ejection fraction ([place]) 02/09/2017  . Jerking movements of extremities 12/08/2016  . Routine adult health maintenance 12/08/2016  . Chronic pain of right knee 09/08/2016   . Abdominal muscle pain 09/08/2016  . Vertigo 08/05/2016  . Lightheadedness 07/15/2016  . CKD (chronic kidney disease) stage 2, GFR 60-89 ml/min 07/15/2016  . Bilateral lower extremity edema 05/13/2016  . OSA on CPAP 05/13/2016  . Vaginal pruritus 03/14/2016  . Morbid obesity due to excess calories ([place]) 11/27/2015  . Callus of foot 11/26/2015  . History of Low serum cortisol level ([place]) 01/07/2015  . Financial difficulty 04/25/2013  . Uncontrolled type 2 diabetes mellitus ([place]) 07/12/2012  . Necrotizing fasciitis s/p OR debridements 07/06/2012  . Carpal tunnel syndrome, bilateral 01/25/2012  . Diabetic peripheral neuropathy ([place]) 07/04/2011  . Anxiety and depression 06/02/2011  . History of abnormal cervical Pap smear 12/13/2007  . GERD 12/06/2007  . Hyperlipidemia 08/29/2007  . Essential hypertension 08/29/2007   Starr Lake PT, DPT, LAT, ATC  03/09/17  9:32 AM      [facility] [address] [place], [place], [address] Phone: [phone]   Fax:  [phone]  Name: IRIE DOWSON MRN: [id number] Date of Birth: Nov 11, 1975

## 2017-03-10 ENCOUNTER — Other Ambulatory Visit: Payer: Self-pay | Admitting: Internal Medicine

## 2017-03-10 DIAGNOSIS — E113513 Type 2 diabetes mellitus with proliferative diabetic retinopathy with macular edema, bilateral: Secondary | ICD-10-CM | POA: Diagnosis not present

## 2017-03-10 DIAGNOSIS — E1142 Type 2 diabetes mellitus with diabetic polyneuropathy: Secondary | ICD-10-CM

## 2017-03-10 DIAGNOSIS — H04123 Dry eye syndrome of bilateral lacrimal glands: Secondary | ICD-10-CM | POA: Diagnosis not present

## 2017-03-10 DIAGNOSIS — H35033 Hypertensive retinopathy, bilateral: Secondary | ICD-10-CM | POA: Diagnosis not present

## 2017-03-10 DIAGNOSIS — H25013 Cortical age-related cataract, bilateral: Secondary | ICD-10-CM | POA: Diagnosis not present

## 2017-03-10 LAB — HM DIABETES EYE EXAM

## 2017-03-10 MED ORDER — CYCLOBENZAPRINE HCL 10 MG PO TABS: ORAL_TABLET | ORAL | 0 refills | Status: DC

## 2017-03-14 NOTE — Progress Notes (Signed)
[name] Note  03/15/2017     CHIEF COMPLAINT Patient presents for Retina Evaluation and Diabetic Eye Exam   HISTORY OF PRESENT ILLNESS: [name] [name] is a 41 y.o. female who presents to the clinic today for:   HPI    Retina Evaluation  In both eyes.  This started years ago.  Associated Symptoms Floaters, Shoulder/Hip pain and Fatigue.  Negative for Flashes, Blind Spot, Photophobia, Scalp Tenderness, Pain, Glare, Jaw Claudication, Distortion, Redness, Trauma, Weight Loss and Fever.  Context:  distance vision, mid-range vision and near vision.  Treatments tried include no treatments.  I, the attending physician,  performed the HPI with the patient and updated documentation appropriately.        Diabetic Eye Exam  Vision is blurred for near and is blurred for distance.  Associated Symptoms Floaters, Shoulder/Hip pain and Fatigue.  Negative for Flashes, Blind Spot, Photophobia, Scalp Tenderness, Fever, Glare, Jaw Claudication, Distortion, Redness, Trauma, Weight Loss and Pain.  Diabetes characteristics include Type 2 and on insulin.  This started 1 year ago.  Blood sugar level is uncontrolled.  Last Blood Glucose 62.  Last A1C 14.  Associated Diagnosis Neuropathy and Kidney Disease.  I, the attending physician,  performed the HPI with the patient and updated documentation appropriately.        Comments  Referral of [name] [name]. Diabetic retinopathy OU. Patient states floaters started about a year ago OU .  Denies eye pain , flashes and glare. CBG 62 last night. Last A1C 14 . Patient states her A1C are always high and 14 is lowest  its been in a long time. She denies taking  Vitamins and using  eye gtts.   Pt states that she had not had an eye exam for a "very long time" since before seeing Dr. Herbert [name]; Pt states that she has been DM x 21 years; Pt reports that she had an A1C of 14 for awhile due to not taking care of herself; Pt states that she  has had more control of CBG x 1 year; Pt reports that she was told years ago that she may have DM retinopathy; Pt states she is being monitored for kidney issues, states that she was sick x 2 years ago and "kidneys went out"; Pt states that she is disabled, due to DM, anxiety, depression, and states that she has severe leg pain from possible neuropathy and possible fibromyalgia, pt states that she has been disabled for 3 years;      Last edited by [name] Caffey, MD on 03/15/2017 12:33 PM. (History)      Referring physician: [name], MD [place], [place] [address]  HISTORICAL INFORMATION:   Selected notes from the MEDICAL RECORD NUMBER Referral from [name] and [name] for concern of diabetic retinopathy;  Ocular Hx- PDR OU; cataracts OU; DES/Blepharitis OU PMH- HTN; High chol.; Type 2 DM   CURRENT MEDICATIONS: Current Outpatient Prescriptions (Ophthalmic Drugs)  Medication Sig  . prednisoLONE acetate (PRED FORTE) 1 % ophthalmic suspension Place 1 drop into the right eye 4 (four) times daily.   No current facility-administered medications for this visit.  (Ophthalmic Drugs)   Current Outpatient Prescriptions (Other)  Medication Sig  . ACCU-CHEK FASTCLIX LANCETS MISC Check blood sugar 3 times a day before meals  . amitriptyline (ELAVIL) 50 MG tablet Take 1 tablet (50 mg total) by mouth at bedtime.  Marland Kitchen atorvastatin (LIPITOR) 20 MG tablet TAKE 1  TABLET BY MOUTH EVERY DAY  . Blood Glucose Monitoring Suppl (ACCU-CHEK AVIVA PLUS) w/Device KIT 1 each by Does not apply route 4 (four) times daily.  . cyclobenzaprine (FLEXERIL) 10 MG tablet Take 1 tablet by mouth 3 times daily as needed for muscle spasms  . furosemide (LASIX) 20 MG tablet Take 2 tablets (40 mg total) by mouth daily as needed for edema.  Marland Kitchen glucose blood (ACCU-CHEK AVIVA PLUS) test strip Check blood sugar 4 times a day before meals  . insulin glargine (LANTUS) 100 UNIT/ML injection Inject 44 units below  the skin at bedtime. Diagnosis code:  E11.65, Z79.4  . Insulin Pen Needle (B-D UF III MINI PEN NEEDLES) 31G X 5 MM MISC Use 3 per day to inject Insulin  . Insulin Syringe-Needle U-100 31G X 15/64" 0.5 ML MISC Use to inject insulin as isntructed  . INVOKANA 300 MG TABS tablet Take 1 tablet by mouth daily before breakfast  . liraglutide (VICTOZA) 18 MG/3ML SOPN Inject 0.3 mLs (1.8 mg total) into the skin daily.  Marland Kitchen lisinopril (PRINIVIL,ZESTRIL) 20 MG tablet Take 20 mg by mouth daily.  Marland Kitchen LYRICA 100 MG capsule TAKE 1 CAPSULE BY MOUTH 2 TIMES DAILY  . NOVOLOG FLEXPEN 100 UNIT/ML FlexPen Inject 14 units below the skin 3 times daily with meals  . VOLTAREN 1 % GEL Apply 4 grams to affected area (topically) 4 times daily   No current facility-administered medications for this visit.  (Other)      REVIEW OF SYSTEMS: ROS    Positive for: Neurological, Skin, Genitourinary, Musculoskeletal, Endocrine, Cardiovascular, Eyes, Psychiatric, Heme/Lymph   Negative for: Constitutional, Gastrointestinal, HENT, Respiratory, Allergic/Imm   Last edited by [name], LPN on 48/54/6270 35:00 AM. (History)       ALLERGIES Allergies  Allergen Reactions  . Vancomycin Other (See Comments)    Acute renal failure suspected secondary to vanco    PAST MEDICAL HISTORY Past Medical History:  Diagnosis Date  . Abnormal Pap smear of cervix 2009  . Abscess    history of multiple abscesses  . Anemia of chronic disease 2002  . Anxiety   . Bipolar disorder ([place])   . Cellulitis 05/21/2014   right eye  . Chronic bronchitis ([place])    "get it q yr" (05/13/2013)  . Chronic pain   . Depression   . Edema of lower extremity   . Endocarditis 2002   subacute bacterial endocarditis.   . Family history of anesthesia complication    "my mom has a hard time coming out from under"  . GERD (gastroesophageal reflux disease)   . Heart murmur   . History of blood transfusion    "just low blood count" (05/13/2013)  .  Hyperlipidemia   . Hypertension   . Hypothyroidism   . Hypothyroidism, adult 03/21/2014  . Obesity   . OSA on CPAP   . Peripheral neuropathy   . Type II diabetes mellitus ([name] [name])    Past Surgical History:  Procedure Laterality Date  . INCISION AND DRAINAGE ABSCESS     multiple I&Ds  . INCISION AND DRAINAGE ABSCESS Left 07/09/2012   Procedure: DRESSING CHANGE, THIGH WOUND;  Surgeon: Harl Bowie, MD;  Location: [facility] [facility];  Service: General;  Laterality: Left;  . INCISION AND DRAINAGE OF WOUND Left 07/07/2012   Procedure: IRRIGATION AND DEBRIDEMENT WOUND;  Surgeon: Harl Bowie, MD;  Location: [facility];  Service: General;  Laterality: Left;  . INCISION AND DRAINAGE PERIRECTAL ABSCESS Left 07/14/2012   Procedure:  DEBRIDEMENT OF SKIN & SOFT TISSUE; DRESSING CHANGE UNDER ANESTHESIA;  Surgeon: Gayland Curry, MD,FACS;  Location: [facility];  Service: General;  Laterality: Left;  . INCISION AND DRAINAGE PERIRECTAL ABSCESS Left 07/16/2012   Procedure: I&D Left Thigh;  Surgeon: Gwenyth Ober, MD;  Location: [facility];  Service: General;  Laterality: Left;  . INCISION AND DRAINAGE PERIRECTAL ABSCESS N/A 01/05/2015   Procedure: IRRIGATION AND DEBRIDEMENT PERIRECTAL ABSCESS;  Surgeon: Donnie Mesa, MD;  Location: [facility];  Service: General;  Laterality: N/A;  . IRRIGATION AND DEBRIDEMENT ABSCESS Left 07/06/2012   Procedure: IRRIGATION AND DEBRIDEMENT ABSCESS BUTTOCKS AND THIGH;  Surgeon: Shann Medal, MD;  Location: [facility];  Service: General;  Laterality: Left;  . IRRIGATION AND DEBRIDEMENT ABSCESS Left 08/10/2012   Procedure: IRRIGATION AND DEBRIDEMENT ABSCESS;  Surgeon: Madilyn Hook, DO;  Location: [facility];  Service: General;  Laterality: Left;  . TONSILLECTOMY  1994    FAMILY HISTORY Family History  Problem Relation Age of Onset  . Heart failure Mother   . Diabetes Mother   . Kidney disease Mother   . Kidney disease Father   . Diabetes Paternal Grandmother   . Heart failure Paternal Grandmother      SOCIAL HISTORY Social History  Substance Use Topics  . Smoking status: Current Every Day Smoker    Packs/day: 0.50    Years: 6.00    Types: Cigarettes  . Smokeless tobacco: Never Used     Comment: Cutting back  . Alcohol use 0.0 oz/week     Comment: Socially.         OPHTHALMIC EXAM:  Base Eye Exam    Visual Acuity (Snellen - Linear)      Right Left   Dist [place] 20/50 20/70   Work up done by [employer] ([place], 10:47 AM)      Right Left   Pressure 19 14       Pupils      APD   Right None   Left None       Visual Fields (Counting fingers)      Left Right    Full Full       Extraocular Movement      Right Left    Full, Ortho Full, Ortho       Neuro/Psych    Oriented x3:  Yes   Mood/Affect:  Normal       Dilation    Both eyes:  1.0% Mydriacyl, 2.5% Phenylephrine @ 10:47 AM        Slit Lamp and Fundus Exam    External Exam      Right Left   External Normal Normal       Slit Lamp Exam      Right Left   Lids/Lashes Dermatochalasis - upper lid Dermatochalasis - upper lid   Conjunctiva/Sclera melanosis melanosis   Cornea EBMD, 1+ Punctate epithelial erosions, dry tear film EBMD, 1+ Punctate epithelial erosions, dry tear film   Anterior Chamber Deep and quiet Deep and quiet   Iris Round and dilated, no NVI Round and dilated, no NVI   Lens 1+ Nuclear sclerosis, 2+ Cortical cataract 1+ Nuclear sclerosis, 2+ Cortical cataract   Vitreous Normal Normal       Fundus Exam      Right Left   Disc Normal, No NVD Normal, No NVD   C/D Ratio 0.35 0.4   Macula Nasal Exudates, Microaneurysms, good foveal reflex, Small Inferior-temporal flame  hemorrhage Microaneurysms, good foveal reflex, scattered RPE changes, Exudates   Vessels Tortuous, copper wiring Tortuous, AV crossing changes, copper wiring   Periphery Attached, scattered DBH Attached, scattered DBH and CWS        Refraction    Manifest Refraction      Sphere Cylinder Axis Dist VA    Right -3.50 +2.00 110 20/25   Left -2.00 +0.50 073 20/30+2          IMAGING AND PROCEDURES  Imaging and Procedures for 03/15/17  OCT, Retina - OU - Both Eyes     Right Eye Quality was good. Central Foveal Thickness: 237. Progression has no prior data. Findings include normal foveal contour, no SRF, no IRF (Scattered intra retinal hyper reflective material).   Left Eye Quality was good. Central Foveal Thickness: 245. Progression has no prior data. Findings include no SRF (Few areas of IRHM and cystic change).   Notes Images taken, stored on drive   Diagnosis / Impression:  Tr cystic changes OU -- no CSME OU  Clinical management:  See below  Abbreviations: NFP - Normal foveal profile. CME - cystoid macular edema. PED - pigment epithelial detachment. IRF - intraretinal fluid. SRF - subretinal fluid. EZ - ellipsoid zone. ERM - epiretinal membrane. ORA - outer retinal atrophy. ORT - outer retinal tubulation. SRHM - subretinal hyper-reflective material       Fluorescein Angiography Optos (Transit OS)     Right Eye Progression has no prior data. Early phase findings include vascular perfusion defect, leakage, retinal neovascularization, microaneurysm (+IRMA). Mid/Late phase findings include leakage, retinal neovascularization, vascular perfusion defect, microaneurysm (+IRMA).   Left Eye Progression has no prior data. Early phase findings include microaneurysm, retinal neovascularization, vascular perfusion defect (+IRMA). Mid/Late phase findings include leakage, microaneurysm, retinal neovascularization, vascular perfusion defect (+IRMA).   Notes OD: MAs, IRMA, scattered areas of capillary non-perfusion, 2 focal areas of late leakage consistent with NVE OS: MAs, IRMA, scattered areas of capillary non-perfusion, a few focal areas of late leakage consistent with NVE  DX: Proliferative Diabetic Retinopathy OU     Panretinal Photocoagulation - OD - Right Eye     LASER PROCEDURE  NOTE  Diagnosis:   Proliferative Diabetic Retinopathy, RIGHT EYE  Procedure:  Pan-retinal photocoagulation using slit lamp laser, RIGHT EYE  Anesthesia:  Topical  Surgeon: [name] Caffey, MD, PhD   Informed consent obtained, operative eye marked, and time out performed prior to initiation of laser.   Lumenis Smart532 slit lamp laser Pattern: 2x2 square, 3x3 square Power: 230 mW Duration: 30 msec  Spot size: 200 microns  # spots: 1925 spots [phone]   Complications: None.  Notes: significant cortical cataract obscuring view and preventing laser up take in scattered focal areas of periphery  RTC: 2 wks for PRP OS  Patient tolerated the procedure well and received written and verbal post-procedure care information/education.                ASSESSMENT/PLAN:    ICD-10-CM   1. Proliferative diabetic retinopathy of both eyes without macular edema associated with type 2 diabetes mellitus ([place]) S34.1962 Panretinal Photocoagulation - OD - Right Eye  2. Retinal edema H35.81 OCT, Retina - OU - Both Eyes    Fluorescein Angiography Optos (Transit OS)  3. Cortical cataract of both eyes H26.9   4. Hypertensive retinopathy of both eyes H35.033     1,2. Proliferative diabetic retinopathy, both eyes The incidence, risk factors for progression, natural history and treatment options for diabetic retinopathy  were discussed with patient.  The need for close monitoring of blood glucose, blood pressure, and serum lipids, avoiding cigarette or any type of tobacco, and the need for long term follow up was also discussed with patient. - IRMA and early NV with leakage noted on FA in addition to significant patches of capillary nonperfusion - No visually significant diabetic macular edema on OCT, both eyes  - discussed treatment options for PDR including laser, antiVEGF therapy - recommend laser PRP OU, OD first -- pt wishes to proceed - RBA of procedure discussed, questions answered - informed  consent obtained and signed - see procedure note - start PF QID OD x7days - f/u in 2 wks for PRP OS  3. Cortical cataract OU - The symptoms of cataract, surgical options, and treatments and risks were discussed with patient. - discussed diagnosis and progression - not yet visually significant - monitor for now  4. Hypertensive retinopathy OU - discussed the importance of tight BP control - monitor  Ophthalmic Meds Ordered this visit:  Meds ordered this encounter  Medications  . prednisoLONE acetate (PRED FORTE) 1 % ophthalmic suspension    Sig: Place 1 drop into the right eye 4 (four) times daily.    Dispense:  10 mL    Refill:  0       Return in about 2 weeks (around 03/29/2017) for Laser, POV.  There are no Patient Instructions on file for this visit.   Explained the diagnoses, plan, and follow up with the patient and they expressed understanding.  Patient expressed understanding of the importance of proper follow up care.   Gardiner Sleeper, M.D., Ph.D. Diseases & Surgery of the Retina and Vitreous [facility] 03/15/17     Abbreviations: M myopia (nearsighted); A astigmatism; H hyperopia (farsighted); P presbyopia; Mrx spectacle prescription;  CTL contact lenses; OD right eye; OS left eye; OU both eyes  XT exotropia; ET esotropia; PEK punctate epithelial keratitis; PEE punctate epithelial erosions; DES dry eye syndrome; MGD meibomian gland dysfunction; ATs artificial tears; PFAT's preservative free artificial tears; [place] nuclear sclerotic cataract; PSC posterior subcapsular cataract; ERM epi-retinal membrane; PVD posterior vitreous detachment; RD retinal detachment; DM diabetes mellitus; DR diabetic retinopathy; NPDR non-proliferative diabetic retinopathy; PDR proliferative diabetic retinopathy; CSME clinically significant macular edema; DME diabetic macular edema; dbh dot blot hemorrhages; CWS cotton wool spot; POAG primary open angle glaucoma; C/D  cup-to-disc ratio; HVF humphrey visual field; GVF goldmann visual field; OCT optical coherence tomography; IOP intraocular pressure; BRVO Branch retinal vein occlusion; CRVO central retinal vein occlusion; CRAO central retinal artery occlusion; BRAO branch retinal artery occlusion; RT retinal tear; SB scleral buckle; PPV pars plana vitrectomy; VH Vitreous hemorrhage; PRP panretinal laser photocoagulation; IVK intravitreal kenalog; VMT vitreomacular traction; MH Macular hole;  NVD neovascularization of the disc; NVE neovascularization elsewhere; AREDS age related eye disease study; ARMD age related macular degeneration; POAG primary open angle glaucoma; EBMD epithelial/anterior basement membrane dystrophy; ACIOL anterior chamber intraocular lens; IOL intraocular lens; PCIOL posterior chamber intraocular lens; Phaco/IOL phacoemulsification with intraocular lens placement; [place] photorefractive keratectomy; LASIK laser assisted in situ keratomileusis; HTN hypertension; DM diabetes mellitus; COPD chronic obstructive pulmonary disease

## 2017-03-15 ENCOUNTER — Ambulatory Visit (INDEPENDENT_AMBULATORY_CARE_PROVIDER_SITE_OTHER): Payer: Medicare Other | Admitting: Ophthalmology

## 2017-03-15 ENCOUNTER — Encounter (INDEPENDENT_AMBULATORY_CARE_PROVIDER_SITE_OTHER): Payer: Self-pay | Admitting: Ophthalmology

## 2017-03-15 DIAGNOSIS — H269 Unspecified cataract: Secondary | ICD-10-CM

## 2017-03-15 DIAGNOSIS — E113593 Type 2 diabetes mellitus with proliferative diabetic retinopathy without macular edema, bilateral: Secondary | ICD-10-CM

## 2017-03-15 DIAGNOSIS — H35033 Hypertensive retinopathy, bilateral: Secondary | ICD-10-CM | POA: Diagnosis not present

## 2017-03-15 DIAGNOSIS — H3581 Retinal edema: Secondary | ICD-10-CM | POA: Diagnosis not present

## 2017-03-15 MED ORDER — PREDNISOLONE ACETATE 1 % OP SUSP: 1.0000 [drp] | Freq: Four times a day (QID) | OPHTHALMIC | 0 refills | Status: DC

## 2017-03-20 ENCOUNTER — Other Ambulatory Visit: Payer: Self-pay | Admitting: Internal Medicine

## 2017-03-20 DIAGNOSIS — M7918 Myalgia, other site: Secondary | ICD-10-CM

## 2017-03-21 ENCOUNTER — Telehealth: Payer: Self-pay | Admitting: Physical Therapy

## 2017-03-21 ENCOUNTER — Ambulatory Visit: Payer: Medicare Other | Admitting: Physical Therapy

## 2017-03-21 NOTE — Telephone Encounter (Signed)
Attempted to call patient about no show visit.  Number [phone] called.  Phone was not answered.  I was unable to leave voicemail.  Phone rang for over a minute.   Melvenia Needles PTA

## 2017-03-22 ENCOUNTER — Other Ambulatory Visit: Payer: Self-pay | Admitting: Internal Medicine

## 2017-03-23 ENCOUNTER — Ambulatory Visit: Payer: Medicare Other | Attending: Internal Medicine | Admitting: Physical Therapy

## 2017-03-23 ENCOUNTER — Encounter: Payer: Self-pay | Admitting: Physical Therapy

## 2017-03-23 ENCOUNTER — Telehealth: Payer: Self-pay | Admitting: Internal Medicine

## 2017-03-23 DIAGNOSIS — R296 Repeated falls: Secondary | ICD-10-CM | POA: Insufficient documentation

## 2017-03-23 DIAGNOSIS — G8929 Other chronic pain: Secondary | ICD-10-CM | POA: Insufficient documentation

## 2017-03-23 DIAGNOSIS — M62830 Muscle spasm of back: Secondary | ICD-10-CM | POA: Insufficient documentation

## 2017-03-23 DIAGNOSIS — R2689 Other abnormalities of gait and mobility: Secondary | ICD-10-CM | POA: Insufficient documentation

## 2017-03-23 DIAGNOSIS — M6281 Muscle weakness (generalized): Secondary | ICD-10-CM | POA: Insufficient documentation

## 2017-03-23 DIAGNOSIS — M545 Low back pain: Secondary | ICD-10-CM | POA: Insufficient documentation

## 2017-03-23 NOTE — Telephone Encounter (Signed)
rtc to pt, she states she has had no injuries or accidents to injure L leg, she states there is an area in her L groin that is tender and slightly swollen but does not hurt as bad as lower L leg, this started appr 3 days ago, leg has progressively become more painful, swollen and warm to touch, states much warmer than R leg, she states back of lower L leg has a red color to skin, when she moves toes toward knee as leg is outstretched it causes her to cry in pain. She is advised due to lack of transportation to call 911 and inform ems/ ED staff that imc triage advised transport to ED. She is agreeable.

## 2017-03-23 NOTE — Telephone Encounter (Signed)
Left leg hot, feverish, swelling and pain x 3 days.  Unable to call transportation for Medicaid with Short notice.  Please call pt back.

## 2017-03-23 NOTE — Therapy (Addendum)
[place], [place], [address] Phone: [phone]   Fax:  [phone]  Physical Therapy Treatment / Discharged Summary  Patient Details  Name: [name] [name] MRN: [id number] Date of Birth: May 22, 1976 Referring Provider: Aldine Contes, MD  Encounter Date: 03/23/2017      PT End of Session - 03/23/17 0748    Visit Number 3   Number of Visits 13   Date for PT Re-Evaluation 04/06/17   PT Start Time 0731   PT Stop Time 0812   PT Time Calculation (min) 41 min   Activity Tolerance Patient tolerated treatment well   Behavior During Therapy [facility] for tasks assessed/performed      Past Medical History:  Diagnosis Date  . Abnormal Pap smear of cervix 2009  . Abscess    history of multiple abscesses  . Anemia of chronic disease 2002  . Anxiety   . Bipolar disorder ([place])   . Cellulitis 05/21/2014   right eye  . Chronic bronchitis ([place])    "get it q yr" (05/13/2013)  . Chronic pain   . Depression   . Edema of lower extremity   . Endocarditis 2002   subacute bacterial endocarditis.   . Family history of anesthesia complication    "my mom has a hard time coming out from under"  . GERD (gastroesophageal reflux disease)   . Heart murmur   . History of blood transfusion    "just low blood count" (05/13/2013)  . Hyperlipidemia   . Hypertension   . Hypothyroidism   . Hypothyroidism, adult 03/21/2014  . Obesity   . OSA on CPAP   . Peripheral neuropathy   . Type II diabetes mellitus ([place])     Past Surgical History:  Procedure Laterality Date  . INCISION AND DRAINAGE ABSCESS     multiple I&Ds  . INCISION AND DRAINAGE ABSCESS Left 07/09/2012   Procedure: DRESSING CHANGE, THIGH WOUND;  Surgeon: Harl Bowie, MD;  Location: [place];  Service: General;  Laterality: Left;  . INCISION AND DRAINAGE OF WOUND Left 07/07/2012   Procedure: IRRIGATION AND DEBRIDEMENT WOUND;  Surgeon: Harl Bowie,  MD;  Location: [place];  Service: General;  Laterality: Left;  . INCISION AND DRAINAGE PERIRECTAL ABSCESS Left 07/14/2012   Procedure: DEBRIDEMENT OF SKIN & SOFT TISSUE; DRESSING CHANGE UNDER ANESTHESIA;  Surgeon: Gayland Curry, MD,FACS;  Location: [place];  Service: General;  Laterality: Left;  . INCISION AND DRAINAGE PERIRECTAL ABSCESS Left 07/16/2012   Procedure: I&D Left Thigh;  Surgeon: Gwenyth Ober, MD;  Location: [place];  Service: General;  Laterality: Left;  . INCISION AND DRAINAGE PERIRECTAL ABSCESS N/A 01/05/2015   Procedure: IRRIGATION AND DEBRIDEMENT PERIRECTAL ABSCESS;  Surgeon: Donnie Mesa, MD;  Location: [place];  Service: General;  Laterality: N/A;  . IRRIGATION AND DEBRIDEMENT ABSCESS Left 07/06/2012   Procedure: IRRIGATION AND DEBRIDEMENT ABSCESS BUTTOCKS AND THIGH;  Surgeon: Shann Medal, MD;  Location: [place];  Service: General;  Laterality: Left;  . IRRIGATION AND DEBRIDEMENT ABSCESS Left 08/10/2012   Procedure: IRRIGATION AND DEBRIDEMENT ABSCESS;  Surgeon: Madilyn Hook, DO;  Location: [place];  Service: General;  Laterality: Left;  . TONSILLECTOMY  1994    There were no vitals filed for this visit.      Subjective Assessment - 03/23/17 0736    Subjective I feel cold,  My left leg is warm  I have a cramp in left cramp amd in left groin.  I try to do the exercises.  Bad pain in the last 3 days.  Not sure why.  I had a panic attack in the lobby.     Currently in Pain? Yes   Pain Score 10-Worst pain ever   Pain Location Back   Pain Orientation Right;Left;Lower   Pain Descriptors / Indicators Stabbing;Sharp;Constant;Cramping  stabbing comes and goes(  neuropathy pain)   Pain Type Chronic pain   Pain Radiating Towards Left leg   Pain Frequency Constant   Aggravating Factors  Standing walking bending,  sitting   Pain Relieving Factors Meds,  not sure                         [facility] Adult PT Treatment/Exercise - 03/23/17 0001      Lumbar Exercises: Stretches    Lower Trunk Rotation 5 reps   Lower Trunk Rotation Limitations 5 seconds   Standing Extension 3 reps;10 seconds   Standing Extension Limitations ROM gradully improved     Lumbar Exercises: Supine   Ab Set 5 reps   Glut Set 5 reps   Clam 5 reps   Clam Limitations green band,  cued breathing,  core   Bridge 10 reps   Bridge Limitations small movements     Knee/Hip Exercises: Standing   Hip Extension 5 reps;Both   Extension Limitations some pain     Modalities   Modalities Moist Heat     Moist Heat Therapy   Number Minutes Moist Heat 10 Minutes   Moist Heat Location Lumbar Spine;Knee  bil knee                  PT Short Term Goals - 02/23/17 1750      PT SHORT TERM GOAL #1   Title pt to be I with inital HEP    Time 3   Period Weeks   Status New   Target Date 03/16/17     PT SHORT TERM GOAL #2   Title pt to verbalize/ demo proper posture with sitting, standing/walking and lifting mechanics to prevent/ reduce low back pain   Time 3   Period Weeks   Status New   Target Date 03/16/17     PT SHORT TERM GOAL #3   Title pt to increase walking/ standing to 20 min with LRAD for safety with </= 6/10 pain  and no report of buckling in the knees   Time 3   Period Weeks   Status New   Target Date 03/16/17           PT Long Term Goals - 02/23/17 1752      PT LONG TERM GOAL #1   Title pt to increase trunk mobility by >/= 8 degrees in all directions with </= 4/10 pain for functional mobility    Time 6   Period Weeks   Status New   Target Date 04/06/17     PT LONG TERM GOAL #2   Title pt to be able to walk/ stand >/= 30 min with LRAD for safety for in home and community ambulation with </= 4/10 pain   Time 6   Period Weeks   Status New     PT LONG TERM GOAL #3   Title pt will be able to perform ADLs with </= 4/10 pain and no report of buckling in the knees for 2 weeks.    Time 6   Period Weeks   Status New  PT LONG TERM GOAL #4   Title increase  FOTO score to </= 48% limited to demo improvement in function   Time 6   Period Weeks   Status New   Target Date 04/06/17        G-code: limitation pain, ROM, strength, FOTO/ clinical judgement  Functional limitation: mobility:walking and moving around goals status: CK discharged Status:CL         Plan - 03/23/17 1050    Clinical Impression Statement Patient was able to improve lumbar extension with Standing back extension,  She had some balance issues with standing hip extension while holding to rolling computer table, needing moderate use of her hands and CGA.  No new objective findings,  Pain iabout the same at the end of session prior to moist heat.  Patient was able to be distracted from pain /  stress issues during exercises with chatting about Halloween and her Goddaughter.    PT Treatment/Interventions ADLs/Self Care Home Management;Cryotherapy;Electrical Stimulation;Iontophoresis 57m/ml Dexamethasone;Moist Heat;Ultrasound;Traction;Therapeutic exercise;Therapeutic activities;Dry needling;Taping;Manual techniques;Neuromuscular re-education;Balance training;Passive range of motion;Patient/family education   PT Next Visit Plan review HEP and updated HEP PRN, trunk mobility, trial extension based treatment modalities PRN, hip / knee strengthening   PT Home Exercise Plan abdominal bracing, lower trunk rotation, prone on elbows, posterior pelvic tilt   Consulted and Agree with Plan of Care Patient      Patient will benefit from skilled therapeutic intervention in order to improve the following deficits and impairments:  Pain, Improper body mechanics, Decreased endurance, Decreased activity tolerance, Decreased mobility, Decreased range of motion, Abnormal gait, Obesity, Increased fascial restricitons, Increased muscle spasms, Hypomobility, Decreased knowledge of use of DME  Visit Diagnosis: Chronic bilateral low back pain, with sciatica presence unspecified  Muscle weakness  (generalized)  Muscle spasm of back  Other abnormalities of gait and mobility  Repeated falls     Problem List Patient Active Problem List   Diagnosis Date Noted  . Acute bilateral low back pain 02/13/2017  . Heart failure with preserved ejection fraction ([place] 02/09/2017  . Jerking movements of extremities 12/08/2016  . Routine adult health maintenance 12/08/2016  . Chronic pain of right knee 09/08/2016  . Abdominal muscle pain 09/08/2016  . Vertigo 08/05/2016  . Lightheadedness 07/15/2016  . CKD (chronic kidney disease) stage 2, GFR 60-89 ml/min 07/15/2016  . Bilateral lower extremity edema 05/13/2016  . OSA on CPAP 05/13/2016  . Vaginal pruritus 03/14/2016  . Morbid obesity due to excess calories ([place] 11/27/2015  . Callus of foot 11/26/2015  . History of Low serum cortisol level ([place] 01/07/2015  . Financial difficulty 04/25/2013  . Uncontrolled type 2 diabetes mellitus ([place] 07/12/2012  . Necrotizing fasciitis s/p OR debridements 07/06/2012  . Carpal tunnel syndrome, bilateral 01/25/2012  . Diabetic peripheral neuropathy ([place] 07/04/2011  . Anxiety and depression 06/02/2011  . History of abnormal cervical Pap smear 12/13/2007  . GERD 12/06/2007  . Hyperlipidemia 08/29/2007  . Essential hypertension 08/29/2007    HARRIS,KAREN PTA 03/23/2017, 10:55 AM  [facility],The[address] [address]Phone: [phone]  Fax:  [phone] Name: SSARYAH LOPERMRN: [id number] of Birth: 1Jul 12, 1977          PHYSICAL THERAPY DISCHARGE SUMMARY  Visits from Start of Care: 3  Current functional level related to goals / functional outcomes: See goals   Remaining deficits: unknown   Education / Equipment: HEP, theraband, posture Plan: Patient agrees to discharge.  Patient  goals were not met. Patient is being discharged due to a change in medical status.  ?????     Kristoffer  Leamon PT, DPT, LAT, ATC  04/05/17  9:42 AM

## 2017-03-24 ENCOUNTER — Emergency Department (HOSPITAL_COMMUNITY): Payer: Medicare Other

## 2017-03-24 ENCOUNTER — Encounter (HOSPITAL_COMMUNITY): Payer: Self-pay | Admitting: Emergency Medicine

## 2017-03-24 ENCOUNTER — Inpatient Hospital Stay (HOSPITAL_COMMUNITY)
Admission: EM | Admit: 2017-03-24 | Discharge: 2017-04-02 | DRG: 854 | Disposition: A | Discharge status: Home Health | Payer: Medicare Other | Attending: Internal Medicine | Admitting: Internal Medicine

## 2017-03-24 DIAGNOSIS — N182 Chronic kidney disease, stage 2 (mild): Secondary | ICD-10-CM | POA: Diagnosis not present

## 2017-03-24 DIAGNOSIS — E11621 Type 2 diabetes mellitus with foot ulcer: Secondary | ICD-10-CM | POA: Diagnosis present

## 2017-03-24 DIAGNOSIS — I5032 Chronic diastolic (congestive) heart failure: Secondary | ICD-10-CM | POA: Diagnosis not present

## 2017-03-24 DIAGNOSIS — R6883 Chills (without fever): Secondary | ICD-10-CM | POA: Diagnosis not present

## 2017-03-24 DIAGNOSIS — K219 Gastro-esophageal reflux disease without esophagitis: Secondary | ICD-10-CM | POA: Diagnosis present

## 2017-03-24 DIAGNOSIS — F4321 Adjustment disorder with depressed mood: Secondary | ICD-10-CM | POA: Diagnosis present

## 2017-03-24 DIAGNOSIS — F17210 Nicotine dependence, cigarettes, uncomplicated: Secondary | ICD-10-CM | POA: Diagnosis present

## 2017-03-24 DIAGNOSIS — E11628 Type 2 diabetes mellitus with other skin complications: Secondary | ICD-10-CM | POA: Diagnosis not present

## 2017-03-24 DIAGNOSIS — E872 Acidosis: Secondary | ICD-10-CM | POA: Diagnosis not present

## 2017-03-24 DIAGNOSIS — G4733 Obstructive sleep apnea (adult) (pediatric): Secondary | ICD-10-CM

## 2017-03-24 DIAGNOSIS — E875 Hyperkalemia: Secondary | ICD-10-CM | POA: Diagnosis not present

## 2017-03-24 DIAGNOSIS — E119 Type 2 diabetes mellitus without complications: Secondary | ICD-10-CM | POA: Diagnosis present

## 2017-03-24 DIAGNOSIS — I872 Venous insufficiency (chronic) (peripheral): Secondary | ICD-10-CM | POA: Diagnosis present

## 2017-03-24 DIAGNOSIS — G8929 Other chronic pain: Secondary | ICD-10-CM | POA: Diagnosis present

## 2017-03-24 DIAGNOSIS — L97529 Non-pressure chronic ulcer of other part of left foot with unspecified severity: Secondary | ICD-10-CM | POA: Diagnosis present

## 2017-03-24 DIAGNOSIS — F419 Anxiety disorder, unspecified: Secondary | ICD-10-CM | POA: Diagnosis present

## 2017-03-24 DIAGNOSIS — E1159 Type 2 diabetes mellitus with other circulatory complications: Secondary | ICD-10-CM | POA: Diagnosis present

## 2017-03-24 DIAGNOSIS — M79605 Pain in left leg: Secondary | ICD-10-CM | POA: Diagnosis not present

## 2017-03-24 DIAGNOSIS — G4700 Insomnia, unspecified: Secondary | ICD-10-CM | POA: Diagnosis not present

## 2017-03-24 DIAGNOSIS — I130 Hypertensive heart and chronic kidney disease with heart failure and stage 1 through stage 4 chronic kidney disease, or unspecified chronic kidney disease: Secondary | ICD-10-CM | POA: Diagnosis not present

## 2017-03-24 DIAGNOSIS — E782 Mixed hyperlipidemia: Secondary | ICD-10-CM

## 2017-03-24 DIAGNOSIS — E871 Hypo-osmolality and hyponatremia: Secondary | ICD-10-CM | POA: Diagnosis not present

## 2017-03-24 DIAGNOSIS — R9431 Abnormal electrocardiogram [ECG] [EKG]: Secondary | ICD-10-CM | POA: Diagnosis not present

## 2017-03-24 DIAGNOSIS — Z560 Unemployment, unspecified: Secondary | ICD-10-CM | POA: Diagnosis not present

## 2017-03-24 DIAGNOSIS — E785 Hyperlipidemia, unspecified: Secondary | ICD-10-CM | POA: Diagnosis present

## 2017-03-24 DIAGNOSIS — M7989 Other specified soft tissue disorders: Secondary | ICD-10-CM | POA: Diagnosis not present

## 2017-03-24 DIAGNOSIS — E1165 Type 2 diabetes mellitus with hyperglycemia: Secondary | ICD-10-CM | POA: Diagnosis not present

## 2017-03-24 DIAGNOSIS — F319 Bipolar disorder, unspecified: Secondary | ICD-10-CM | POA: Diagnosis present

## 2017-03-24 DIAGNOSIS — Z841 Family history of disorders of kidney and ureter: Secondary | ICD-10-CM

## 2017-03-24 DIAGNOSIS — L0390 Cellulitis, unspecified: Secondary | ICD-10-CM | POA: Diagnosis not present

## 2017-03-24 DIAGNOSIS — N179 Acute kidney failure, unspecified: Secondary | ICD-10-CM | POA: Diagnosis not present

## 2017-03-24 DIAGNOSIS — R600 Localized edema: Secondary | ICD-10-CM | POA: Diagnosis not present

## 2017-03-24 DIAGNOSIS — N186 End stage renal disease: Secondary | ICD-10-CM | POA: Diagnosis present

## 2017-03-24 DIAGNOSIS — L97929 Non-pressure chronic ulcer of unspecified part of left lower leg with unspecified severity: Secondary | ICD-10-CM | POA: Diagnosis not present

## 2017-03-24 DIAGNOSIS — D638 Anemia in other chronic diseases classified elsewhere: Secondary | ICD-10-CM | POA: Diagnosis present

## 2017-03-24 DIAGNOSIS — R6520 Severe sepsis without septic shock: Secondary | ICD-10-CM | POA: Diagnosis present

## 2017-03-24 DIAGNOSIS — E1142 Type 2 diabetes mellitus with diabetic polyneuropathy: Secondary | ICD-10-CM | POA: Diagnosis not present

## 2017-03-24 DIAGNOSIS — Z992 Dependence on renal dialysis: Secondary | ICD-10-CM | POA: Diagnosis present

## 2017-03-24 DIAGNOSIS — Z8249 Family history of ischemic heart disease and other diseases of the circulatory system: Secondary | ICD-10-CM

## 2017-03-24 DIAGNOSIS — Z6841 Body Mass Index (BMI) 40.0 and over, adult: Secondary | ICD-10-CM | POA: Diagnosis not present

## 2017-03-24 DIAGNOSIS — L03116 Cellulitis of left lower limb: Secondary | ICD-10-CM | POA: Diagnosis not present

## 2017-03-24 DIAGNOSIS — E039 Hypothyroidism, unspecified: Secondary | ICD-10-CM | POA: Diagnosis present

## 2017-03-24 DIAGNOSIS — E1122 Type 2 diabetes mellitus with diabetic chronic kidney disease: Secondary | ICD-10-CM | POA: Diagnosis present

## 2017-03-24 DIAGNOSIS — I87332 Chronic venous hypertension (idiopathic) with ulcer and inflammation of left lower extremity: Secondary | ICD-10-CM | POA: Diagnosis not present

## 2017-03-24 DIAGNOSIS — I10 Essential (primary) hypertension: Secondary | ICD-10-CM | POA: Diagnosis not present

## 2017-03-24 DIAGNOSIS — Z736 Limitation of activities due to disability: Secondary | ICD-10-CM | POA: Diagnosis not present

## 2017-03-24 DIAGNOSIS — Z794 Long term (current) use of insulin: Secondary | ICD-10-CM

## 2017-03-24 DIAGNOSIS — A419 Sepsis, unspecified organism: Secondary | ICD-10-CM | POA: Diagnosis not present

## 2017-03-24 DIAGNOSIS — F329 Major depressive disorder, single episode, unspecified: Secondary | ICD-10-CM | POA: Diagnosis not present

## 2017-03-24 DIAGNOSIS — Z713 Dietary counseling and surveillance: Secondary | ICD-10-CM

## 2017-03-24 DIAGNOSIS — Z9989 Dependence on other enabling machines and devices: Secondary | ICD-10-CM

## 2017-03-24 DIAGNOSIS — L02612 Cutaneous abscess of left foot: Secondary | ICD-10-CM | POA: Diagnosis not present

## 2017-03-24 DIAGNOSIS — Z881 Allergy status to other antibiotic agents status: Secondary | ICD-10-CM

## 2017-03-24 DIAGNOSIS — Z22322 Carrier or suspected carrier of Methicillin resistant Staphylococcus aureus: Secondary | ICD-10-CM

## 2017-03-24 DIAGNOSIS — R031 Nonspecific low blood-pressure reading: Secondary | ICD-10-CM | POA: Diagnosis not present

## 2017-03-24 DIAGNOSIS — I5030 Unspecified diastolic (congestive) heart failure: Secondary | ICD-10-CM | POA: Diagnosis not present

## 2017-03-24 DIAGNOSIS — E1151 Type 2 diabetes mellitus with diabetic peripheral angiopathy without gangrene: Secondary | ICD-10-CM | POA: Diagnosis present

## 2017-03-24 DIAGNOSIS — N183 Chronic kidney disease, stage 3 (moderate): Secondary | ICD-10-CM | POA: Diagnosis not present

## 2017-03-24 DIAGNOSIS — Z9114 Patient's other noncompliance with medication regimen: Secondary | ICD-10-CM

## 2017-03-24 DIAGNOSIS — Z79899 Other long term (current) drug therapy: Secondary | ICD-10-CM

## 2017-03-24 DIAGNOSIS — Z833 Family history of diabetes mellitus: Secondary | ICD-10-CM

## 2017-03-24 DIAGNOSIS — R509 Fever, unspecified: Secondary | ICD-10-CM | POA: Diagnosis not present

## 2017-03-24 LAB — I-STAT CHEM 8, ED
BUN: 39 mg/dL — ABNORMAL HIGH (ref 6–20)
CALCIUM ION: 1.08 mmol/L — AB (ref 1.15–1.40)
CHLORIDE: 97 mmol/L — AB (ref 101–111)
Creatinine, Ser: 1.9 mg/dL — ABNORMAL HIGH (ref 0.44–1.00)
Glucose, Bld: 222 mg/dL — ABNORMAL HIGH (ref 65–99)
HCT: 30 % — ABNORMAL LOW (ref 36.0–46.0)
HEMOGLOBIN: 10.2 g/dL — AB (ref 12.0–15.0)
Potassium: 4.7 mmol/L (ref 3.5–5.1)
SODIUM: 132 mmol/L — AB (ref 135–145)
TCO2: 27 mmol/L (ref 22–32)

## 2017-03-24 LAB — COMPREHENSIVE METABOLIC PANEL
ALT: 17 U/L (ref 14–54)
AST: 19 U/L (ref 15–41)
Albumin: 2.7 g/dL — ABNORMAL LOW (ref 3.5–5.0)
Alkaline Phosphatase: 118 U/L (ref 38–126)
Anion gap: 10 (ref 5–15)
BUN: 40 mg/dL — ABNORMAL HIGH (ref 6–20)
CHLORIDE: 96 mmol/L — AB (ref 101–111)
CO2: 26 mmol/L (ref 22–32)
Calcium: 8.6 mg/dL — ABNORMAL LOW (ref 8.9–10.3)
Creatinine, Ser: 1.85 mg/dL — ABNORMAL HIGH (ref 0.44–1.00)
GFR, EST AFRICAN AMERICAN: 38 mL/min — AB (ref >60)
GFR, EST NON AFRICAN AMERICAN: 33 mL/min — AB (ref >60)
Glucose, Bld: 221 mg/dL — ABNORMAL HIGH (ref 65–99)
POTASSIUM: 4.8 mmol/L (ref 3.5–5.1)
Sodium: 132 mmol/L — ABNORMAL LOW (ref 135–145)
Total Bilirubin: 0.8 mg/dL (ref 0.3–1.2)
Total Protein: 7.8 g/dL (ref 6.5–8.1)

## 2017-03-24 LAB — CBC WITH DIFFERENTIAL/PLATELET
BASOS ABS: 0 10*3/uL (ref 0.0–0.1)
Basophils Relative: 0 %
EOS ABS: 0 10*3/uL (ref 0.0–0.7)
Eosinophils Relative: 0 %
HCT: 29.4 % — ABNORMAL LOW (ref 36.0–46.0)
HEMOGLOBIN: 9.6 g/dL — AB (ref 12.0–15.0)
LYMPHS ABS: 2 10*3/uL (ref 0.7–4.0)
LYMPHS PCT: 11 %
MCH: 26.4 pg (ref 26.0–34.0)
MCHC: 32.7 g/dL (ref 30.0–36.0)
MCV: 80.8 fL (ref 78.0–100.0)
Monocytes Absolute: 1.4 10*3/uL — ABNORMAL HIGH (ref 0.1–1.0)
Monocytes Relative: 7 %
NEUTROS PCT: 82 %
Neutro Abs: 16.1 10*3/uL — ABNORMAL HIGH (ref 1.7–7.7)
Platelets: 236 10*3/uL (ref 150–400)
RBC: 3.64 MIL/uL — AB (ref 3.87–5.11)
RDW: 14.6 % (ref 11.5–15.5)
WBC: 19.5 10*3/uL — AB (ref 4.0–10.5)

## 2017-03-24 LAB — I-STAT BETA HCG BLOOD, ED (MC, WL, AP ONLY)

## 2017-03-24 LAB — I-STAT TROPONIN, ED: TROPONIN I, POC: 0.02 ng/mL (ref 0.00–0.08)

## 2017-03-24 LAB — CK: CK TOTAL: 85 U/L (ref 38–234)

## 2017-03-24 LAB — I-STAT CG4 LACTIC ACID, ED
LACTIC ACID, VENOUS: 1.38 mmol/L (ref 0.5–1.9)
Lactic Acid, Venous: 2.21 mmol/L (ref 0.5–1.9)

## 2017-03-24 LAB — LACTIC ACID, PLASMA: Lactic Acid, Venous: 0.8 mmol/L (ref 0.5–1.9)

## 2017-03-24 MED ORDER — DOXYCYCLINE HYCLATE 100 MG IV SOLR
100.0000 mg | Freq: Once | INTRAVENOUS | Status: AC
  Administered 2017-03-24: 100 mg via INTRAVENOUS
  Filled 2017-03-24: qty 100

## 2017-03-24 MED ORDER — METRONIDAZOLE 500 MG PO TABS
500.0000 mg | ORAL_TABLET | Freq: Three times a day (TID) | ORAL | Status: DC
  Administered 2017-03-24: 500 mg via ORAL
  Filled 2017-03-24: qty 1

## 2017-03-24 MED ORDER — SODIUM CHLORIDE 0.9 % IV BOLUS (SEPSIS)
1000.0000 mL | Freq: Once | INTRAVENOUS | Status: AC
  Administered 2017-03-24: 1000 mL via INTRAVENOUS

## 2017-03-24 MED ORDER — DEXTROSE 5 % IV SOLN
2.0000 g | Freq: Three times a day (TID) | INTRAVENOUS | Status: DC
  Administered 2017-03-25 – 2017-04-02 (×25): 2 g via INTRAVENOUS
  Filled 2017-03-24 (×26): qty 2

## 2017-03-24 MED ORDER — HYDROMORPHONE HCL 1 MG/ML IJ SOLN
0.5000 mg | Freq: Once | INTRAMUSCULAR | Status: AC
  Administered 2017-03-24: 0.5 mg via INTRAVENOUS
  Filled 2017-03-24: qty 1

## 2017-03-24 MED ORDER — PIPERACILLIN-TAZOBACTAM 3.375 G IVPB 30 MIN: 3.3750 g | Freq: Once | INTRAVENOUS | Status: DC

## 2017-03-24 MED ORDER — ACETAMINOPHEN 325 MG PO TABS
650.0000 mg | ORAL_TABLET | Freq: Once | ORAL | Status: DC
  Administered 2017-03-24: 650 mg via ORAL
  Filled 2017-03-24: qty 2

## 2017-03-24 MED ORDER — IBUPROFEN 200 MG PO TABS
600.0000 mg | ORAL_TABLET | Freq: Once | ORAL | Status: AC
  Administered 2017-03-24: 600 mg via ORAL
  Filled 2017-03-24: qty 3

## 2017-03-24 MED ORDER — DEXTROSE 5 % IV SOLN
2.0000 g | Freq: Once | INTRAVENOUS | Status: AC
  Administered 2017-03-24: 2 g via INTRAVENOUS
  Filled 2017-03-24: qty 2

## 2017-03-24 MED ORDER — ACETAMINOPHEN 500 MG PO TABS: 1000.0000 mg | ORAL_TABLET | Freq: Once | ORAL | Status: DC

## 2017-03-24 MED ORDER — SODIUM CHLORIDE 0.9 % IV SOLN
1000.0000 mL | INTRAVENOUS | Status: DC
  Administered 2017-03-24 – 2017-03-25 (×2): 1000 mL via INTRAVENOUS

## 2017-03-24 MED ORDER — ONDANSETRON HCL 4 MG/2ML IJ SOLN
4.0000 mg | Freq: Once | INTRAMUSCULAR | Status: AC
  Administered 2017-03-24: 4 mg via INTRAVENOUS
  Filled 2017-03-24: qty 2

## 2017-03-24 NOTE — Consult Note (Signed)
Name: [name] [name] MRN: [id number] DOB: 1975/11/09    ADMISSION DATE:  03/24/2017 CONSULTATION DATE:  03/24/2017  REFERRING MD :  Dr. Roel Cluck   CHIEF COMPLAINT:  Cellulitis   HISTORY OF PRESENT ILLNESS:   41 year old female with PMH of CKD stage 2, Diabetic Peripheral Neuropathy, G2DD, HTN, HLD, Uncontrolled DM, OSA on CPAP, Bipolar Disorder, H/O Endocarditis, Necrotizing Fascitis of the groin, Chronic back and leg pain   Presents to ED 11/2 with reported 3 days of fever and increased swelling to left lower extremity. Upon arrival to ED Temp 102.3, HR 132, BP 104/60. CT Foot with skin thickening and subcutaneous soft tissue edema consistent with cellulitis with no bone destruction noted.  Given 4L NS, Cefepime, Doxycycline, Flagyl.  LA 2.21, WBC 19.5. Currently on CPAP. PCCM asked to consult.  On my evaluation patient states that for the last 3-4 days she has had worsening of her bilateral lower ext edema she states that at that time she noticed her left leg was larger than her right. She denies a  History of immobility, recent surgery and no prev history of malignancy or coagulopathy. Pt was previously treated for necrotizing fascitis of her left groin several years ago.  She reports taking all of her med including lisinopril, furosemide, and her antidiabetic meds.   SIGNIFICANT EVENTS  11/2 > Presents to ED   STUDIES:  ECHO 01/25/17 >The cavity size was normal. Wall thickness was   increased in a pattern of mild LVH. Systolic function was normal.   The estimated ejection fraction was in the range of 60% to 65%.   Although no diagnostic regional wall motion abnormality was   identified, this possibility cannot be completely excluded on the   basis of this study. Features are consistent with a pseudonormal   left ventricular filling pattern, with concomitant abnormal   relaxation and increased filling pressure (grade 2 diastolic   dysfunction).  CXR 11/2 > Borderline cardiomegaly  with probable mild vascular congestion. No focal consolidation DG Foot Complete Left 11/2 > 1. Diffuse soft tissue swelling in the foot and lower calf. Calcifications in the cutaneous tissues the lower calf are probably from venous insufficiency. Dorsal midfoot spurring.  CT Left Foot 11/2 > Diffuse skin thickening and subcutaneous soft tissue edema consistent with cellulitis and/or third spacing of fluid possibly from chronic venous insufficiency. 2. Midfoot osteoarthritis with subcortical and subchondral cystic change of the cuneiforms. No acute fracture or bone destruction. 3. Prominent dorsal calcaneal enthesophyte.   PAST MEDICAL HISTORY :   has a past medical history of Abnormal Pap smear of cervix (2009); Abscess; Anemia of chronic disease (2002); Anxiety; Bipolar disorder ([place]); Cellulitis (05/21/2014); Chronic bronchitis ([place]); Chronic pain; Depression; Edema of lower extremity; Endocarditis (2002); Family history of anesthesia complication; GERD (gastroesophageal reflux disease); Heart murmur; History of blood transfusion; Hyperlipidemia; Hypertension; Hypothyroidism; Hypothyroidism, adult (03/21/2014); Obesity; OSA on CPAP; Peripheral neuropathy; and Type II diabetes mellitus ([place]).  has a past surgical history that includes Incision and drainage abscess; Irrigation and debridement abscess (Left, 07/06/2012); Incision and drainage of wound (Left, 07/07/2012); Incision and drainage abscess (Left, 07/09/2012); Incision and drainage perirectal abscess (Left, 07/14/2012); Incision and drainage perirectal abscess (Left, 07/16/2012); Irrigation and debridement abscess (Left, 08/10/2012); Tonsillectomy (1994); and Incision and drainage perirectal abscess (N/A, 01/05/2015). Prior to Admission medications   Medication Sig Start Date End Date Taking? Authorizing Provider  amitriptyline (ELAVIL) 50 MG tablet Take 1 tablet (50 mg total) by mouth at bedtime. 02/09/17  Yes Winfrey, William B, MD  atorvastatin  (LIPITOR) 20 MG tablet TAKE 1 TABLET BY MOUTH EVERY DAY 02/27/17  Yes Butcher, Elizabeth A, MD  cyclobenzaprine (FLEXERIL) 10 MG tablet Take 1 tablet (10 mg total) by mouth every 12 (twelve) hours as needed for muscle spasms. 03/20/17  Yes Klima, Lawrence, MD  furosemide (LASIX) 20 MG tablet Take 2 tablets (40 mg total) by mouth daily as needed for edema. 02/09/17  Yes Winfrey, William B, MD  insulin glargine (LANTUS) 100 UNIT/ML injection Inject 44 units below the skin at bedtime. Diagnosis code:  E11.65, Z79.4 02/01/17  Yes Narendra, Nischal, MD  INVOKANA 300 MG TABS tablet Take 1 tablet by mouth daily before breakfast 03/07/17  Yes Kumar, Ajay, MD  liraglutide (VICTOZA) 18 MG/3ML SOPN Inject 0.3 mLs (1.8 mg total) into the skin daily. 09/09/16  Yes Krall, Jennifer T, MD  lisinopril (PRINIVIL,ZESTRIL) 20 MG tablet TAKE 1 TABLET BY MOUTH EVERY DAY 03/23/17  Yes Vincent, Duncan Thomas, MD  LYRICA 100 MG capsule TAKE 1 CAPSULE BY MOUTH 2 TIMES DAILY 02/27/17  Yes Patel, Donika K, DO  NOVOLOG FLEXPEN 100 UNIT/ML FlexPen Inject 14 units below the skin 3 times daily with meals 03/07/17  Yes Kumar, Ajay, MD  prednisoLONE acetate (PRED FORTE) 1 % ophthalmic suspension Place 1 drop into the right eye 4 (four) times daily. 03/15/17 03/15/18 Yes Zamora, Brian, MD  VOLTAREN 1 % GEL Apply 4 grams to affected area (topically) 4 times daily 03/07/17  Yes Butcher, Elizabeth A, MD  ACCU-CHEK FASTCLIX LANCETS MISC Check blood sugar 3 times a day before meals 08/20/15   Krall, Jennifer T, MD  Blood Glucose Monitoring Suppl (ACCU-CHEK AVIVA PLUS) w/Device KIT 1 each by Does not apply route 4 (four) times daily. 11/24/16   Kumar, Ajay, MD  glucose blood (ACCU-CHEK AVIVA PLUS) test strip Check blood sugar 4 times a day before meals 01/31/17   Kumar, Ajay, MD  Insulin Pen Needle (B-D UF III MINI PEN NEEDLES) 31G X 5 MM MISC Use 3 per day to inject Insulin 01/03/17   Butcher, Elizabeth A, MD  Insulin Syringe-Needle U-100 31G X 15/64"  0.5 ML MISC Use to inject insulin as isntructed 02/09/17   Winfrey, William B, MD   Allergies  Allergen Reactions  . Vancomycin Other (See Comments)    Acute renal failure suspected secondary to vanco    FAMILY HISTORY:  family history includes Diabetes in her father, mother, and paternal grandmother; Heart failure in her mother and paternal grandmother; Kidney disease in her father and mother; Other in her other. SOCIAL HISTORY:  reports that she has been smoking Cigarettes.  She has a 3.00 pack-year smoking history. She has never used smokeless tobacco. She reports that she drinks alcohol. She reports that she does not use drugs.  REVIEW OF SYSTEMS:   All negative; except for those that are bolded, which indicate positives. Constitutional: weight loss, weight gain, night sweats, fevers, chills, fatigue, weakness.  HEENT: headaches, sore throat, sneezing, nasal congestion, post nasal drip, difficulty swallowing, tooth/dental problems, visual complaints, visual changes, ear aches. Neuro: difficulty with speech, weakness, numbness, ataxia. CV:  chest pain, orthopnea, PND, swelling in lower extremities, dizziness, palpitations, syncope.  Resp: cough, hemoptysis, dyspnea, wheezing. GI: heartburn, indigestion, abdominal pain, nausea, vomiting, diarrhea, constipation, change in bowel habits, loss of appetite, hematemesis, melena, hematochezia.  GU: dysuria, change in color of urine, urgency or frequency, flank pain, hematuria. MSK: bilateral lower ext swelling, swelling in L>R decreased range of   motion of LLE. Psych: low affect, depression, anxiety, suicidal ideations, homicidal ideations. Skin: rash, itching, bruising. redness in LLE  SUBJECTIVE:   VITAL SIGNS: Temp:  [102.3 F (39.1 C)] 102.3 F (39.1 C) (11/02 1814) Pulse Rate:  [118-140] 118 (11/02 2250) Resp:  [15-20] 16 (11/02 2250) BP: (88-104)/(55-78) 101/72 (11/02 2250) SpO2:  [93 %-100 %] 94 % (11/02 2250) Weight:  [149.7 kg  (330 lb)] 149.7 kg (330 lb) (11/02 1932)  PHYSICAL EXAMINATION: General:  Obese female awake and alert  Neuro: no focal deficits EOM intact HEENT: NIPPV mask on with good seal normocephalic atraumatic Cardiovascular: S1 and S2 appreciated Lungs:  Decreased at bases Abdomen:  Obese soft abdomen with +BS Musculoskeletal:  Bilateral lower ext edema +2, LLE erythematous, warm to the touch, swelling more profound in this extremity up to the knee.  Skin: grossly intact   Recent Labs Lab 03/24/17 1904 03/24/17 1909  NA 132* 132*  K 4.8 4.7  CL 96* 97*  CO2 26  --   BUN 40* 39*  CREATININE 1.85* 1.90*  GLUCOSE 221* 222*    Recent Labs Lab 03/24/17 1904 03/24/17 1909  HGB 9.6* 10.2*  HCT 29.4* 30.0*  WBC 19.5*  --   PLT 236  --    Ct Foot Left Wo Contrast  Result Date: 03/24/2017 CLINICAL DATA:  Bilateral lower extremity swelling x2 days. EXAM: CT OF THE LEFT FOOT WITHOUT CONTRAST TECHNIQUE: Multidetector CT imaging of the left foot was performed according to the standard protocol. Multiplanar CT image reconstructions were also generated. COMPARISON:  II 18 foot radiographs. FINDINGS: Bones/Joint/Cartilage Degenerative subchondral cysts of the medial, middle and lateral cuneiform secondary to osteoarthritis across navicular-cuneiform as well as the second and third TMT joints. No fracture or suspicious osseous lesions. Dorsal calcaneal enthesopathy. No joint dislocations. Ligaments Suboptimally assessed by CT. Muscles and Tendons No significant muscle atrophy. No intramuscular hemorrhage. The tendons crossing the ankle joint demonstrate no evidence of tenosynovitis or tendinopathy. Soft tissues Diffuse skin thickening and subcutaneous soft tissue induration consistent cellulitis and/or third spacing. Numerous calcified phleboliths are seen within the subcutaneous soft tissues about the ankle and foot. IMPRESSION: 1. Diffuse skin thickening and subcutaneous soft tissue edema consistent  with cellulitis and/or third spacing of fluid possibly from chronic venous insufficiency. 2. Midfoot osteoarthritis with subcortical and subchondral cystic change of the cuneiforms. No acute fracture or bone destruction. 3. Prominent dorsal calcaneal enthesophyte. Electronically Signed   By: David  Kwon M.D.   On: 03/24/2017 22:37   Dg Chest Portable 1 View  Result Date: 03/24/2017 CLINICAL DATA:  41-year-old female with sepsis. EXAM: PORTABLE CHEST 1 VIEW COMPARISON:  Chest radiograph dated 07/27/2012 FINDINGS: There is borderline cardiomegaly. Mild prominence of the central vasculature likely representing mild vascular congestion. There is no focal consolidation, pleural effusion, or pneumothorax. No acute osseous pathology. IMPRESSION: Borderline cardiomegaly with probable mild vascular congestion. No focal consolidation. Electronically Signed   By: Arash  Radparvar M.D.   On: 03/24/2017 19:07   Dg Foot Complete Left  Result Date: 03/24/2017 CLINICAL DATA:  Swelling in both lower extremities with chest pain and shortness of breath. EXAM: LEFT FOOT - COMPLETE 3+ VIEW COMPARISON:  None. FINDINGS: Cutaneous calcifications along the lower calf probably from venous insufficiency. Soft tissue swelling in the lower calf and foot. Dorsal spurring in the midfoot. Normal alignment at the Lisfranc joint. No acute bony findings. IMPRESSION: 1. Diffuse soft tissue swelling in the foot and lower calf. Calcifications in the cutaneous tissues the   lower calf are probably from venous insufficiency. 2. Dorsal midfoot spurring. Electronically Signed   By: Van Clines M.D.   On: 03/24/2017 19:32    ASSESSMENT / PLAN: Sepsis secondary to Midfoot osteoarthritis  - CT scan official read reviewed - pt received 5L of IVF on evaluation her MAPs 61s - no need for vasopressors at this time  - Pt started on antibiotics cefepime and doxycycline  - MAP goal >22mHg - blood cx obtained, empiric abx started  T2DM-  insulin dept. - recommend Phase 1 protocol with subcutaneous insulin - blood glucode control imperative in setting of infection - Goal BG 140-1860mdl  H/o OSA - CPAP when sleeping - patient is awake and alert no signs of hypercarbia - when CPAP is off end tidal CO2 via [place] recommended  Osteoarthritis  - based on findings of Degenerative subchondral cysts of the medial, middle and lateral   cuneiform secondary to osteoarthritis across navicular-cuneiform as   well as the second and third TMT joints - IV Antibiotics started -  Podiatry consult recommended  - remaining issues per IM plan of care  At the time of my evaluation patient was fluid responsive with MAPS>80 and not requiring a critical care setting for her medical mgmt. PCCM appreciates the consult.    I, Dr KrSeward Carolave personally reviewed patient's available data, including medical history, events of note, physical examination and test results as part of my evaluation. I have discussed with NP and other care providers such as pharmacist, Hospitalist and RN.  My evaluation required high complexity decision making for assessment and support, titration of therapies, application of advanced monitoring technologies and extensive interpretation of multiple databases.  Critical Care Time devoted to patient care services described in this note is 35 Minutes. This time reflects time of care of this signee Dr KrSeward CarolThis critical care time does not reflect procedure time, or teaching time or supervisory time of NP  but could involve care discussion time    DISPOSITION: Hospitalist PROGNOSIS: Guarded CODE STATUS: Full CC Time: 35 mins FAMILY: not at bedside  KrKandice HamsD Pulmonary and [place] 03/24/2017, 11:12 PM

## 2017-03-24 NOTE — H&P (Addendum)
[name] [name] SPQ:[id number] DOB: [birth year] DOA: 03/24/2017     PCP: Colbert Ewing, MD  internal medicine resident clinic but no beds available at [place] Specialists: endocrine Elayne Snare Patient coming from:    home     Chief Complaint: Leg pain and swelling  HPI: [name] [name] is a 41 y.o. female with medical history significant of necrotizing fasciitis of the groin, diabetic neuropathy,  T2DM, HTN, stock CHF OSA on CPAP, and Obesity chronic back pain and bipolar disorder history of endocarditis, CKD stage 3    Presented with left leg erythema, sensation of subjective fevers for past 3 days her left leg has been feeling hot and swelling progressively she has history of neuropathies that she wasn't feeling much her pain prior to this but reports maybe her foot has been swelling and prior to that. Rising injury the back of her left lower leg also has had some erythema and progressively getting above the knee level. Reports that several years ago she have had necrotizing fasciitis in the left groin and she have had a little bit of left groin tenderness but nearly not as much as her foot.     Regarding pertinent Chronic problems: History of chronic diabetic neuropathy treated with Lyrica and amitriptyline History of [poorly controlled diabetes mellitus  IN ER:  Temp (24hrs), Avg:102.3 F (39.1 C), Min:102.3 F (39.1 C), Max:102.3 F (39.1 C)      on arrival  ED Triage Vitals  Enc Vitals Group     BP 03/24/17 1814 (!) 104/55     Pulse Rate 03/24/17 1814 (!) 140     Resp 03/24/17 1814 20     Temp 03/24/17 1814 (!) 102.3 F (39.1 C)     Temp Source 03/24/17 1814 Oral     SpO2 03/24/17 1814 93 %     Weight 03/24/17 1932 (!) 330 lb (149.7 kg)     Height 03/24/17 1932 5' 10" (1.778 m)     Head Circumference --      Peak Flow --      Pain Score 03/24/17 1811 10     Pain Loc --      Pain Edu? --      Excl. in [place]? --     Latest RR 19 100% 3L HR 126  BP 96/78 LA 2.21 Trop 0.02 Na 132 BUN 39 Cr 1.9 up from baseline of 1.09 in August [place] 2.7 WBC 19.5 hemoglobin 9.6 disclosed to her baseline except that in March 2017 she was up to 12.1 Plain imaging showed diffuse soft tissue swelling venous insufficiency and calcifications noted no gas Chest x-ray nonfocal but borderline cardiomegaly and mid vascular congestion Following Medications were ordered in ER: Medications  sodium chloride 0.9 % bolus 1,000 mL (0 mLs Intravenous Stopped 03/24/17 1949)    Followed by  0.9 %  sodium chloride infusion (1,000 mLs Intravenous New Bag/Given 03/24/17 1919)  doxycycline (VIBRAMYCIN) 100 mg in dextrose 5 % 250 mL IVPB (100 mg Intravenous New Bag/Given 03/24/17 1943)  metroNIDAZOLE (FLAGYL) tablet 500 mg (500 mg Oral Given 03/24/17 1913)  acetaminophen (TYLENOL) tablet 1,000 mg (0 mg Oral Hold 03/24/17 1918)  sodium chloride 0.9 % bolus 1,000 mL (not administered)  sodium chloride 0.9 % bolus 1,000 mL (not administered)  ceFEPIme (MAXIPIME) 2 g in dextrose 5 % 50 mL IVPB (0 g Intravenous Stopped 03/24/17 1943)  HYDROmorphone (DILAUDID) injection 0.5 mg (0.5 mg Intravenous Given 03/24/17 1924)  ondansetron (ZOFRAN)  injection 4 mg (4 mg Intravenous Given 03/24/17 1923)     ER provider discussed case with:  Hedrixs with orthopedics Who recommends:admitssin to medicine for IV antibiotics he will see in AM     Hospitalist was called for admission for sepsis secondary to severe cellulitis  Review of Systems:    Pertinent positives include: Fevers, chills, fatigue, left leg pain  Constitutional:  No weight loss, night sweats,  weight loss  HEENT:  No headaches, Difficulty swallowing,Tooth/dental problems,Sore throat,  No sneezing, itching, ear ache, nasal congestion, post nasal drip,  Cardio-vascular:  No chest pain, Orthopnea, PND, anasarca, dizziness, palpitations.no Bilateral lower extremity swelling  GI:  No heartburn, indigestion, abdominal pain,  nausea, vomiting, diarrhea, change in bowel habits, loss of appetite, melena, blood in stool, hematemesis Resp:  no shortness of breath at rest. No dyspnea on exertion, No excess mucus, no productive cough, No non-productive cough, No coughing up of blood.No change in color of mucus.No wheezing. Skin:  no rash or lesions. No jaundice GU:  no dysuria, change in color of urine, no urgency or frequency. No straining to urinate.  No flank pain.  Musculoskeletal:  No joint pain or no joint swelling. No decreased range of motion. No back pain.  Psych:  No change in mood or affect. No depression or anxiety. No memory loss.  Neuro: no localizing neurological complaints, no tingling, no weakness, no double vision, no gait abnormality, no slurred speech, no confusion  As per HPI otherwise 10 point review of systems negative.   Past Medical History: Past Medical History:  Diagnosis Date  . Abnormal Pap smear of cervix 2009  . Abscess    history of multiple abscesses  . Anemia of chronic disease 2002  . Anxiety   . Bipolar disorder ([place])   . Cellulitis 05/21/2014   right eye  . Chronic bronchitis ([place])    "get it q yr" (05/13/2013)  . Chronic pain   . Depression   . Edema of lower extremity   . Endocarditis 2002   subacute bacterial endocarditis.   . Family history of anesthesia complication    "my mom has a hard time coming out from under"  . GERD (gastroesophageal reflux disease)   . Heart murmur   . History of blood transfusion    "just low blood count" (05/13/2013)  . Hyperlipidemia   . Hypertension   . Hypothyroidism   . Hypothyroidism, adult 03/21/2014  . Obesity   . OSA on CPAP   . Peripheral neuropathy   . Type II diabetes mellitus ([place])    Past Surgical History:  Procedure Laterality Date  . INCISION AND DRAINAGE ABSCESS     multiple I&Ds  . INCISION AND DRAINAGE ABSCESS Left 07/09/2012   Procedure: DRESSING CHANGE, THIGH WOUND;  Surgeon: Harl Bowie, MD;   Location: [facility];  Service: General;  Laterality: Left;  . INCISION AND DRAINAGE OF WOUND Left 07/07/2012   Procedure: IRRIGATION AND DEBRIDEMENT WOUND;  Surgeon: Harl Bowie, MD;  Location: [facility];  Service: General;  Laterality: Left;  . INCISION AND DRAINAGE PERIRECTAL ABSCESS Left 07/14/2012   Procedure: DEBRIDEMENT OF SKIN & SOFT TISSUE; DRESSING CHANGE UNDER ANESTHESIA;  Surgeon: Gayland Curry, MD,FACS;  Location: [facility];  Service: General;  Laterality: Left;  . INCISION AND DRAINAGE PERIRECTAL ABSCESS Left 07/16/2012   Procedure: I&D Left Thigh;  Surgeon: Gwenyth Ober, MD;  Location: [facility];  Service: General;  Laterality: Left;  . INCISION AND DRAINAGE  PERIRECTAL ABSCESS N/A 01/05/2015   Procedure: IRRIGATION AND DEBRIDEMENT PERIRECTAL ABSCESS;  Surgeon: Donnie Mesa, MD;  Location: [facility];  Service: General;  Laterality: N/A;  . IRRIGATION AND DEBRIDEMENT ABSCESS Left 07/06/2012   Procedure: IRRIGATION AND DEBRIDEMENT ABSCESS BUTTOCKS AND THIGH;  Surgeon: Shann Medal, MD;  Location: [facility];  Service: General;  Laterality: Left;  . IRRIGATION AND DEBRIDEMENT ABSCESS Left 08/10/2012   Procedure: IRRIGATION AND DEBRIDEMENT ABSCESS;  Surgeon: Madilyn Hook, DO;  Location: [facility];  Service: General;  Laterality: Left;  . TONSILLECTOMY  1994     Social History:  Ambulatory independently     reports that she has been smoking Cigarettes.  She has a 3.00 pack-year smoking history. She has never used smokeless tobacco. She reports that she drinks alcohol. She reports that she does not use drugs.  Allergies:   Allergies  Allergen Reactions  . Vancomycin Other (See Comments)    Acute renal failure suspected secondary to vanco       Family History:   Family History  Problem Relation Age of Onset  . Heart failure Mother   . Diabetes Mother   . Kidney disease Mother   . Kidney disease Father   . Diabetes Paternal Grandmother   . Heart failure Paternal Grandmother      Medications: Prior to Admission medications   Medication Sig Start Date End Date Taking? Authorizing Provider  ACCU-CHEK FASTCLIX LANCETS MISC Check blood sugar 3 times a day before meals 08/20/15   Milagros Loll, MD  amitriptyline (ELAVIL) 50 MG tablet Take 1 tablet (50 mg total) by mouth at bedtime. 02/09/17   Katherine Roan, MD  atorvastatin (LIPITOR) 20 MG tablet TAKE 1 TABLET BY MOUTH EVERY DAY 02/27/17   Bartholomew Crews, MD  Blood Glucose Monitoring Suppl (ACCU-CHEK AVIVA PLUS) w/Device KIT 1 each by Does not apply route 4 (four) times daily. 11/24/16   Elayne Snare, MD  cyclobenzaprine (FLEXERIL) 10 MG tablet Take 1 tablet (10 mg total) by mouth every 12 (twelve) hours as needed for muscle spasms. 03/20/17   Oval Linsey, MD  furosemide (LASIX) 20 MG tablet Take 2 tablets (40 mg total) by mouth daily as needed for edema. 02/09/17   Katherine Roan, MD  glucose blood (ACCU-CHEK AVIVA PLUS) test strip Check blood sugar 4 times a day before meals 01/31/17   Elayne Snare, MD  insulin glargine (LANTUS) 100 UNIT/ML injection Inject 44 units below the skin at bedtime. Diagnosis code:  E11.65, Z79.4 02/01/17   Aldine Contes, MD  Insulin Pen Needle (B-D UF III MINI PEN NEEDLES) 31G X 5 MM MISC Use 3 per day to inject Insulin 01/03/17   Bartholomew Crews, MD  Insulin Syringe-Needle U-100 31G X 15/64" 0.5 ML MISC Use to inject insulin as isntructed 02/09/17   Katherine Roan, MD  INVOKANA 300 MG TABS tablet Take 1 tablet by mouth daily before breakfast 03/07/17   Elayne Snare, MD  liraglutide (VICTOZA) 18 MG/3ML SOPN Inject 0.3 mLs (1.8 mg total) into the skin daily. 09/09/16   Milagros Loll, MD  lisinopril (PRINIVIL,ZESTRIL) 20 MG tablet Take 20 mg by mouth daily. 02/01/17   [provider]  lisinopril (PRINIVIL,ZESTRIL) 20 MG tablet TAKE 1 TABLET BY MOUTH EVERY DAY 03/23/17   Axel Filler, MD  LYRICA 100 MG capsule TAKE 1 CAPSULE BY MOUTH 2 TIMES DAILY 02/27/17    Patel, Donika K, DO  NOVOLOG FLEXPEN 100 UNIT/ML FlexPen Inject 14 units  below the skin 3 times daily with meals 03/07/17   Elayne Snare, MD  prednisoLONE acetate (PRED FORTE) 1 % ophthalmic suspension Place 1 drop into the right eye 4 (four) times daily. 03/15/17 03/15/18  [name], MD  VOLTAREN 1 % GEL Apply 4 grams to affected area (topically) 4 times daily 03/07/17   Bartholomew Crews, MD    Physical Exam: Patient Vitals for the past 24 hrs:  BP Temp Temp src Pulse Resp SpO2 Height Weight  03/24/17 2000 96/78 - - (!) 126 19 100 % - -  03/24/17 1942 103/60 - - (!) 133 18 100 % - -  03/24/17 1932 - - - - - - 5' 10" (1.778 m) (!) 149.7 kg (330 lb)  03/24/17 1814 (!) 104/55 (!) 102.3 F (39.1 C) Oral (!) 140 20 93 % - -    1. General:  in No Acute distress  toxic acutely ill -appearing 2. Psychological: Alert and   Oriented 3. Head/ENT:   Moist   Mucous Membranes                          Head Non traumatic, neck supple                           Poor Dentition 4. SKIN:  decreased Skin turgor,  Skin clean Dry redness swelling noted over left foot and leg appears the warm to to palpation with some fluctuance In the foot              5. Heart: Regular rate and rhythm no Murmur, no Rub or gallop 6. Lungs:  Clear to auscultation bilaterally, no wheezes or crackles  ,distant 7. Abdomen: Soft,  non-tender,   obese  bowel sounds present 8. Lower extremities: no clubbing, cyanosis, or edema 9. Neurologically Grossly intact, moving all 4 extremities equally   10. MSK: Normal range of motion   body mass index is 47.35 kg/m.  Labs on Admission:   Labs on Admission: I have personally reviewed following labs and imaging studies  CBC:  Recent Labs Lab 03/24/17 1904 03/24/17 1909  WBC 19.5*  --   NEUTROABS 16.1*  --   HGB 9.6* 10.2*  HCT 29.4* 30.0*  MCV 80.8  --   PLT 236  --    Basic Metabolic Panel:  Recent Labs Lab 03/24/17 1904 03/24/17 1909  NA 132*  132*  K 4.8 4.7  CL 96* 97*  CO2 26  --   GLUCOSE 221* 222*  BUN 40* 39*  CREATININE 1.85* 1.90*  CALCIUM 8.6*  --    GFR: Estimated Creatinine Clearance: 62.8 mL/min (A) (by C-G formula based on SCr of 1.9 mg/dL (H)). Liver Function Tests:  Recent Labs Lab 03/24/17 1904  AST 19  ALT 17  ALKPHOS 118  BILITOT 0.8  PROT 7.8  ALBUMIN 2.7*   No results for input(s): LIPASE, AMYLASE in the last 168 hours. No results for input(s): AMMONIA in the last 168 hours. Coagulation Profile: No results for input(s): INR, PROTIME in the last 168 hours. Cardiac Enzymes: No results for input(s): CKTOTAL, CKMB, CKMBINDEX, TROPONINI in the last 168 hours. BNP (last 3 results) No results for input(s): PROBNP in the last 8760 hours. HbA1C: No results for input(s): HGBA1C in the last 72 hours. CBG: No results for input(s): GLUCAP in the last 168 hours. Lipid Profile: No results for input(s): CHOL, HDL, LDLCALC,  TRIG, CHOLHDL, LDLDIRECT in the last 72 hours. Thyroid Function Tests: No results for input(s): TSH, T4TOTAL, FREET4, T3FREE, THYROIDAB in the last 72 hours. Anemia Panel: No results for input(s): VITAMINB12, FOLATE, FERRITIN, TIBC, IRON, RETICCTPCT in the last 72 hours. Urine analysis:  Sepsis Labs: _0 (procalcitonin:4,lacticidven:4) )No results found for this or any previous visit (from the past 240 hour(s)).     UA not ordered  Lab Results  Component Value Date   HGBA1C 11.3 (H) 11/21/2016    Estimated Creatinine Clearance: 62.8 mL/min (A) (by C-G formula based on SCr of 1.9 mg/dL (H)).  BNP (last 3 results) No results for input(s): PROBNP in the last 8760 hours.   ECG REPORT  Independently reviewed Rate: 134  Rhythm: sinus tachycardia ST&T Change: Nonspecific ST segment changes likely rate related  QTC 421  Filed Weights   03/24/17 1932  Weight: (!) 149.7 kg (330 lb)     Cultures:    Component Value Date/Time   SDES WOUND 07/29/2016 1454    SPECREQUEST SOFT TISSUE 07/29/2016 1454   CULT  07/29/2016 1454    RARE EIKENELLA CORRODENS Usually susceptible to penicillin and other beta lactam agents,quinolones,macrolides and tetracyclines.    REPTSTATUS 08/01/2016 FINAL 07/29/2016 1454     Radiological Exams on Admission: Dg Chest Portable 1 View  Result Date: 03/24/2017 CLINICAL DATA:  41 year old female with sepsis. EXAM: PORTABLE CHEST 1 VIEW COMPARISON:  Chest radiograph dated 07/27/2012 FINDINGS: There is borderline cardiomegaly. Mild prominence of the central vasculature likely representing mild vascular congestion. There is no focal consolidation, pleural effusion, or pneumothorax. No acute osseous pathology. IMPRESSION: Borderline cardiomegaly with probable mild vascular congestion. No focal consolidation. Electronically Signed   By: Anner Crete M.D.   On: 03/24/2017 19:07   Dg Foot Complete Left  Result Date: 03/24/2017 CLINICAL DATA:  Swelling in both lower extremities with chest pain and shortness of breath. EXAM: LEFT FOOT - COMPLETE 3+ VIEW COMPARISON:  None. FINDINGS: Cutaneous calcifications along the lower calf probably from venous insufficiency. Soft tissue swelling in the lower calf and foot. Dorsal spurring in the midfoot. Normal alignment at the Lisfranc joint. No acute bony findings. IMPRESSION: 1. Diffuse soft tissue swelling in the foot and lower calf. Calcifications in the cutaneous tissues the lower calf are probably from venous insufficiency. 2. Dorsal midfoot spurring. Electronically Signed   By: Van Clines M.D.   On: 03/24/2017 19:32    Chart has been reviewed    Assessment/Plan  41 y.o. female with medical history significant of necrotizing fasciitis of the groin, diabetic neuropathy,  T2DM, HTN, stock CHF OSA on CPAP, and Obesity chronic back pain and bipolar disorder history of endocarditis, CKD stage 3  Admitted for Severe sepsis secondary to cellulitis  Present on Admission: . Sepsis  ([place]) - Admit per Sepsis protocol likely source being   cellulitis,    - rehydrate with 26m/kg  - initiate broad spectrum antibiotics cefepime, doxy, metronidazole due to hx of allergies to vnacomycine  -  obtain blood cultures  - Obtain serial lactic acid  - Obtain procalcitonin level  - Admit and monitor vital signs closely  - PCCM has been consulted  Sepsis - Repeat Assessment  Performed at:    9:30 pm  Vitals     Blood pressure 92/60, pulse (!) 113, temperature (!) 102.3 F (39.1 C), temperature source Oral, resp. rate 11, height 5' 10" (1.778 m), weight (!) 149.7 kg (330 lb), last menstrual period 02/22/2017, SpO2 95 %.  Heart:  Tachycardic  Lungs:    CTA  Capillary Refill:   <2 sec  Peripheral Pulse:   Radial pulse palpable  Skin:     Normal Color    . CKD (chronic kidney disease) stage 2, GFR 60-89 ml/min - Cr up form baseline will rehydrate avoid nephrotoxic medications . Diabetic peripheral neuropathy (H[facility]) - stable chronic . Essential hypertension - hold home medications deu to hypotension . Hyperlipidemia - stable restart statin when able . Uncontrolled type 2 diabetes mellitus ([place]) - poorly controlled, order lantus at home dose and holdPO medications, SSI   . Hyponatremia  - rehydrate and follow.  . Abnormal ECG - obtain troponin repeat in AM no chest pain OSA - restart CPAP, patient have not been complaint at home  Other plan as per orders.  DVT prophylaxis:  SCD    Code Status:  FULL CODE  as per patient    Family Communication:   Family not  at  Bedside    Disposition Plan:    To home once workup is complete and patient is stable                           Consults called: PCCM, Orthopedics  Admission status:    inpatient       Level of care   SDU      I have spent a total of 67 min on this admission   extra time was spent to discuss case with consultants  Saajan Willmon 03/25/2017, 1:10 AM    [facility]  Pager [phone]    after 2 AM please page floor coverage PA If 7AM-7PM, please contact the day team taking care of the patient  [email or web address]  Password [email or web address]

## 2017-03-24 NOTE — ED Provider Notes (Signed)
[facility] DEPT Provider Note   CSN: [id number] Arrival date & time: 03/24/17  1805     History   Chief Complaint Chief Complaint  Patient presents with  . Leg Swelling  . Fever    HPI OVELLA MANYGOATS is a 41 y.o. female.  HPI  RENIYAH GOOTEE is a 41 y.o. female presents to ED with complaint of leg pain and swelling. Pt states her pain started about 3 days ago in left foot. States since then pain has spread.  She reports generalized malaise.  Unsure of fever.  Reports history of dicing fasciitis several years ago in the left groin.  She does report some left groin tenderness.  She did not take any medications for this prior to coming in.  Denies any injuries to her left foot.  States has bad diabetes and peripheral neuropathy so may have not noticed if injured it.   Past Medical History:  Diagnosis Date  . Abnormal Pap smear of cervix 2009  . Abscess    history of multiple abscesses  . Anemia of chronic disease 2002  . Anxiety   . Bipolar disorder ([place])   . Cellulitis 05/21/2014   right eye  . Chronic bronchitis ([place])    "get it q yr" (05/13/2013)  . Chronic pain   . Depression   . Edema of lower extremity   . Endocarditis 2002   subacute bacterial endocarditis.   . Family history of anesthesia complication    "my mom has a hard time coming out from under"  . GERD (gastroesophageal reflux disease)   . Heart murmur   . History of blood transfusion    "just low blood count" (05/13/2013)  . Hyperlipidemia   . Hypertension   . Hypothyroidism   . Hypothyroidism, adult 03/21/2014  . Obesity   . OSA on CPAP   . Peripheral neuropathy   . Type II diabetes mellitus [facility])     Patient Active Problem List   Diagnosis Date Noted  . Acute bilateral low back pain 02/13/2017  . Heart failure with preserved ejection fraction ([place]) 02/09/2017  . Jerking movements of extremities 12/08/2016  . Routine adult health maintenance  12/08/2016  . Chronic pain of right knee 09/08/2016  . Abdominal muscle pain 09/08/2016  . Vertigo 08/05/2016  . Lightheadedness 07/15/2016  . CKD (chronic kidney disease) stage 2, GFR 60-89 ml/min 07/15/2016  . Bilateral lower extremity edema 05/13/2016  . OSA on CPAP 05/13/2016  . Vaginal pruritus 03/14/2016  . Morbid obesity due to excess calories ([place]) 11/27/2015  . Callus of foot 11/26/2015  . History of Low serum cortisol level ([place]) 01/07/2015  . Financial difficulty 04/25/2013  . Uncontrolled type 2 diabetes mellitus ([place]) 07/12/2012  . Necrotizing fasciitis s/p OR debridements 07/06/2012  . Carpal tunnel syndrome, bilateral 01/25/2012  . Diabetic peripheral neuropathy ([place]) 07/04/2011  . Anxiety and depression 06/02/2011  . History of abnormal cervical Pap smear 12/13/2007  . GERD 12/06/2007  . Hyperlipidemia 08/29/2007  . Essential hypertension 08/29/2007    Past Surgical History:  Procedure Laterality Date  . INCISION AND DRAINAGE ABSCESS     multiple I&Ds  . INCISION AND DRAINAGE ABSCESS Left 07/09/2012   Procedure: DRESSING CHANGE, THIGH WOUND;  Surgeon: Harl Bowie, MD;  Location: [place];  Service: General;  Laterality: Left;  . INCISION AND DRAINAGE OF WOUND Left 07/07/2012   Procedure: IRRIGATION AND DEBRIDEMENT WOUND;  Surgeon: Harl Bowie, MD;  Location: [place];  Service: General;  Laterality: Left;  . INCISION AND DRAINAGE PERIRECTAL ABSCESS Left 07/14/2012   Procedure: DEBRIDEMENT OF SKIN & SOFT TISSUE; DRESSING CHANGE UNDER ANESTHESIA;  Surgeon: Gayland Curry, MD,FACS;  Location: [place];  Service: General;  Laterality: Left;  . INCISION AND DRAINAGE PERIRECTAL ABSCESS Left 07/16/2012   Procedure: I&D Left Thigh;  Surgeon: Gwenyth Ober, MD;  Location: [place];  Service: General;  Laterality: Left;  . INCISION AND DRAINAGE PERIRECTAL ABSCESS N/A 01/05/2015   Procedure: IRRIGATION AND DEBRIDEMENT PERIRECTAL ABSCESS;  Surgeon: Donnie Mesa, MD;  Location:  [place];  Service: General;  Laterality: N/A;  . IRRIGATION AND DEBRIDEMENT ABSCESS Left 07/06/2012   Procedure: IRRIGATION AND DEBRIDEMENT ABSCESS BUTTOCKS AND THIGH;  Surgeon: Shann Medal, MD;  Location: [place];  Service: General;  Laterality: Left;  . IRRIGATION AND DEBRIDEMENT ABSCESS Left 08/10/2012   Procedure: IRRIGATION AND DEBRIDEMENT ABSCESS;  Surgeon: Madilyn Hook, DO;  Location: [place];  Service: General;  Laterality: Left;  . TONSILLECTOMY  1994    OB History    No data available       Home Medications    Prior to Admission medications   Medication Sig Start Date End Date Taking? Authorizing Provider  ACCU-CHEK FASTCLIX LANCETS MISC Check blood sugar 3 times a day before meals 08/20/15   Milagros Loll, MD  amitriptyline (ELAVIL) 50 MG tablet Take 1 tablet (50 mg total) by mouth at bedtime. 02/09/17   Katherine Roan, MD  atorvastatin (LIPITOR) 20 MG tablet TAKE 1 TABLET BY MOUTH EVERY DAY 02/27/17   Bartholomew Crews, MD  Blood Glucose Monitoring Suppl (ACCU-CHEK AVIVA PLUS) w/Device KIT 1 each by Does not apply route 4 (four) times daily. 11/24/16   Elayne Snare, MD  cyclobenzaprine (FLEXERIL) 10 MG tablet Take 1 tablet (10 mg total) by mouth every 12 (twelve) hours as needed for muscle spasms. 03/20/17   Oval Linsey, MD  furosemide (LASIX) 20 MG tablet Take 2 tablets (40 mg total) by mouth daily as needed for edema. 02/09/17   Katherine Roan, MD  glucose blood (ACCU-CHEK AVIVA PLUS) test strip Check blood sugar 4 times a day before meals 01/31/17   Elayne Snare, MD  insulin glargine (LANTUS) 100 UNIT/ML injection Inject 44 units below the skin at bedtime. Diagnosis code:  E11.65, Z79.4 02/01/17   Aldine Contes, MD  Insulin Pen Needle (B-D UF III MINI PEN NEEDLES) 31G X 5 MM MISC Use 3 per day to inject Insulin 01/03/17   Bartholomew Crews, MD  Insulin Syringe-Needle U-100 31G X 15/64" 0.5 ML MISC Use to inject insulin as isntructed 02/09/17   Katherine Roan, MD   INVOKANA 300 MG TABS tablet Take 1 tablet by mouth daily before breakfast 03/07/17   Elayne Snare, MD  liraglutide (VICTOZA) 18 MG/3ML SOPN Inject 0.3 mLs (1.8 mg total) into the skin daily. 09/09/16   Milagros Loll, MD  lisinopril (PRINIVIL,ZESTRIL) 20 MG tablet Take 20 mg by mouth daily. 02/01/17   [provider]  lisinopril (PRINIVIL,ZESTRIL) 20 MG tablet TAKE 1 TABLET BY MOUTH EVERY DAY 03/23/17   Axel Filler, MD  LYRICA 100 MG capsule TAKE 1 CAPSULE BY MOUTH 2 TIMES DAILY 02/27/17   Posey Pronto, Donika K, DO  NOVOLOG FLEXPEN 100 UNIT/ML FlexPen Inject 14 units below the skin 3 times daily with meals 03/07/17   Elayne Snare, MD  prednisoLONE acetate (PRED FORTE) 1 % ophthalmic suspension Place 1 drop into the right  eye 4 (four) times daily. 03/15/17 03/15/18  [name], MD  VOLTAREN 1 % GEL Apply 4 grams to affected area (topically) 4 times daily 03/07/17   Bartholomew Crews, MD    Family History Family History  Problem Relation Age of Onset  . Heart failure Mother   . Diabetes Mother   . Kidney disease Mother   . Kidney disease Father   . Diabetes Paternal Grandmother   . Heart failure Paternal Grandmother     Social History Social History  Substance Use Topics  . Smoking status: Current Every Day Smoker    Packs/day: 0.50    Years: 6.00    Types: Cigarettes  . Smokeless tobacco: Never Used     Comment: Cutting back  . Alcohol use 0.0 oz/week     Comment: Socially.     Allergies   Vancomycin   Review of Systems Review of Systems  Constitutional: Positive for chills and fatigue. Negative for fever.  Respiratory: Negative for cough, chest tightness and shortness of breath.   Cardiovascular: Negative for chest pain, palpitations and leg swelling.  Gastrointestinal: Negative for abdominal pain, diarrhea, nausea and vomiting.  Genitourinary: Negative for dysuria, flank pain and pelvic pain.  Musculoskeletal: Positive for arthralgias and joint  swelling. Negative for myalgias, neck pain and neck stiffness.  Skin: Negative for rash.  Neurological: Positive for weakness. Negative for dizziness and headaches.  All other systems reviewed and are negative.    Physical Exam Updated Vital Signs BP (!) 104/55 (BP Location: Left Arm)   Pulse (!) 140   Temp (!) 102.3 F (39.1 C) (Oral)   Resp 20   LMP 02/22/2017   SpO2 93%   Physical Exam  Constitutional: She appears well-developed and well-nourished.  Morbid obesity  HENT:  Head: Normocephalic.  Eyes: Conjunctivae are normal.  Neck: Neck supple.  Cardiovascular: Normal rate, regular rhythm and normal heart sounds.   Pulmonary/Chest: Effort normal and breath sounds normal. No respiratory distress. She has no wheezes. She has no rales.  Abdominal: Soft. Bowel sounds are normal. She exhibits no distension. There is no tenderness. There is no rebound.  Musculoskeletal: She exhibits no edema.  Left lower leg swelling from foot all the way up to the just distal to the knee.  There is erythema, warmth to touch, diffuse tenderness.  There is increased swelling with fluctuance under the skin between the distal first and second metatarsal.  Tender to palpation. Deep tissue abscess suspected  Neurological: She is alert.  Skin: Skin is warm and dry.  Psychiatric: She has a normal mood and affect. Her behavior is normal.  Nursing note and vitals reviewed.            ED Treatments / Results  Labs (all labs ordered are listed, but only abnormal results are displayed) Labs Reviewed  CULTURE, BLOOD (ROUTINE X 2)  CULTURE, BLOOD (ROUTINE X 2)  URINE CULTURE  COMPREHENSIVE METABOLIC PANEL  CBC WITH DIFFERENTIAL/PLATELET  URINALYSIS, ROUTINE W REFLEX MICROSCOPIC  I-STAT CG4 LACTIC ACID, ED  I-STAT TROPONIN, ED  I-STAT BETA HCG BLOOD, ED ([facility], [facility], [facility] ONLY)  I-STAT CHEM 8, ED    EKG  EKG Interpretation None       Radiology Ct Foot Left Wo Contrast  Result Date:  03/24/2017 CLINICAL DATA:  Bilateral lower extremity swelling x2 days. EXAM: CT OF THE LEFT FOOT WITHOUT CONTRAST TECHNIQUE: Multidetector CT imaging of the left foot was performed according to the standard protocol. Multiplanar CT image  reconstructions were also generated. COMPARISON:  II 18 foot radiographs. FINDINGS: Bones/Joint/Cartilage Degenerative subchondral cysts of the medial, middle and lateral cuneiform secondary to osteoarthritis across navicular-cuneiform as well as the second and third TMT joints. No fracture or suspicious osseous lesions. Dorsal calcaneal enthesopathy. No joint dislocations. Ligaments Suboptimally assessed by CT. Muscles and Tendons No significant muscle atrophy. No intramuscular hemorrhage. The tendons crossing the ankle joint demonstrate no evidence of tenosynovitis or tendinopathy. Soft tissues Diffuse skin thickening and subcutaneous soft tissue induration consistent cellulitis and/or third spacing. Numerous calcified phleboliths are seen within the subcutaneous soft tissues about the ankle and foot. IMPRESSION: 1. Diffuse skin thickening and subcutaneous soft tissue edema consistent with cellulitis and/or third spacing of fluid possibly from chronic venous insufficiency. 2. Midfoot osteoarthritis with subcortical and subchondral cystic change of the cuneiforms. No acute fracture or bone destruction. 3. Prominent dorsal calcaneal enthesophyte. Electronically Signed   By: Ashley Royalty M.D.   On: 03/24/2017 22:37   Dg Chest Portable 1 View  Result Date: 03/24/2017 CLINICAL DATA:  40 year old female with sepsis. EXAM: PORTABLE CHEST 1 VIEW COMPARISON:  Chest radiograph dated 07/27/2012 FINDINGS: There is borderline cardiomegaly. Mild prominence of the central vasculature likely representing mild vascular congestion. There is no focal consolidation, pleural effusion, or pneumothorax. No acute osseous pathology. IMPRESSION: Borderline cardiomegaly with probable mild vascular  congestion. No focal consolidation. Electronically Signed   By: Anner Crete M.D.   On: 03/24/2017 19:07   Dg Foot Complete Left  Result Date: 03/24/2017 CLINICAL DATA:  Swelling in both lower extremities with chest pain and shortness of breath. EXAM: LEFT FOOT - COMPLETE 3+ VIEW COMPARISON:  None. FINDINGS: Cutaneous calcifications along the lower calf probably from venous insufficiency. Soft tissue swelling in the lower calf and foot. Dorsal spurring in the midfoot. Normal alignment at the Lisfranc joint. No acute bony findings. IMPRESSION: 1. Diffuse soft tissue swelling in the foot and lower calf. Calcifications in the cutaneous tissues the lower calf are probably from venous insufficiency. 2. Dorsal midfoot spurring. Electronically Signed   By: Van Clines M.D.   On: 03/24/2017 19:32    Procedures Procedures (including critical care time)  Medications Ordered in ED Medications  sodium chloride 0.9 % bolus 1,000 mL (not administered)    Followed by  0.9 %  sodium chloride infusion (not administered)  doxycycline (VIBRAMYCIN) 100 mg in dextrose 5 % 250 mL IVPB (not administered)  metroNIDAZOLE (FLAGYL) tablet 500 mg (not administered)  ceFEPIme (MAXIPIME) 2 g in dextrose 5 % 50 mL IVPB (not administered)  HYDROmorphone (DILAUDID) injection 0.5 mg (not administered)  ondansetron (ZOFRAN) injection 4 mg (not administered)  acetaminophen (TYLENOL) tablet 1,000 mg (not administered)     Initial Impression / Assessment and Plan / ED Course  I have reviewed the triage vital signs and the nursing notes.  Pertinent labs & imaging results that were available during my care of the patient were reviewed by me and considered in my medical decision making (see chart for details).     Patient seen and examined.  Patient is tachycardic, febrile, meets Sirs criteria.  Source most likely left leg cellulitis with possible deep tissue abscess in her foot.  No drainage or wounds.  Fevers  102.3, Tylenol ordered.  Heart rate is 140.  Due to patient's prior fluid retention and body weight, initially 1 L of fluid ordered.  Her blood pressure is normal at this time.  Will reassess.  If becomes hypotensive or lactic acid comes  back greater than 4, will add more fluid boluses to reach 30 cc/kg.  Antibiotics ordered.  Discussed with pharmacy, patient is allergic to vancomycin.  Will start on cefepime, metronidazole, doxycycline.   Sepsis - Repeat Assessment  Performed at:    7:41 PM    Vitals     Blood pressure (!) 104/60 pulse (!) 132, temperature (!) 102.3 F (39.1 C), temperature source Oral, resp. rate 20, height _0  (1.778 m), weight (!) 149.7 kg (330 lb), last menstrual period 02/22/2017, SpO2 93 %.  Heart:     Tachycardic  Lungs:    CTA  Capillary Refill:   <2 sec  Peripheral Pulse:   Radial pulse palpable  Skin:     Normal Color  Patient is a teaching service patient at [facility], spoke with bed placement, no stepdown or telemetry beds at [facility].  Paged hospitalist.   8:45 PM Spoke with triad, they will admit.  They asked for orthopedics consultation.  I did order CT scan on the foot, however patient unable to have IV contrast.  Do think she may need an MRI foot.  I will call orthopedics.  Spoke with Dr. Doreatha Martin, will review images of the foot and consult in AM.     Final Clinical Impressions(s) / ED Diagnoses   Final diagnoses:  Cellulitis  Sepsis, due to unspecified organism [facility])  Left leg cellulitis    New Prescriptions Current Discharge Medication List       Jeannett Senior, PA-C 03/26/17 [place], [place], DO 03/27/17 1009

## 2017-03-24 NOTE — ED Triage Notes (Addendum)
Patient BIB [facility] for bilat LE swelling x 2 days with left lower extremity more edematous then the right LE.  Vitals: 108/80, HR 82, 18R, CBG 347.

## 2017-03-24 NOTE — Progress Notes (Signed)
A consult was received from an ED physician for Cefepime per pharmacy dosing.  The patient's profile has been reviewed for ht/wt/allergies/indication/available labs. Order set suggests Vancomycin (hx of renal dysfx with use), sub Doxycycline A one time order has been placed for Cefepime 2gm.  Further antibiotics/pharmacy consults should be ordered by admitting physician if indicated.                       Thank you,  Minda Ditto 03/24/2017  7:31 PM

## 2017-03-24 NOTE — Progress Notes (Signed)
Pharmacy Antibiotic Note  [name] [name] is a 41 y.o. female admitted on 03/24/2017 with DM foot infection.  Pharmacy has been consulted for Cefepime dosing.  Cellulitis order set suggests Cefepime/Flagyl po/Vancomycin - hx of renal dysfx with Vancomycin, use Doxycyline.  Plan: Cefepime 2gm q8hr Doxycycline 100mg  IV q12 per MD Flagyl 500mg  po q8  Height: 5\' 10"  (177.8 cm) Weight: (!) 330 lb (149.7 kg) IBW/kg (Calculated) : 68.5  Temp (24hrs), Avg:102.3 F (39.1 C), Min:102.3 F (39.1 C), Max:102.3 F (39.1 C)   Recent Labs Lab 03/24/17 1904 03/24/17 1908 03/24/17 1909 03/24/17 2030  WBC 19.5*  --   --   --   CREATININE 1.85*  --  1.90*  --   LATICACIDVEN  --  1.38  --  2.21*    Estimated Creatinine Clearance: 62.8 mL/min (A) (by C-G formula based on SCr of 1.9 mg/dL (H)).    Allergies  Allergen Reactions  . Vancomycin Other (See Comments)    Acute renal failure suspected secondary to vanco   Antimicrobials this admission: 11/2 Cefepime >>  11/2 Doxycycline >>  11/2 Metronidazole po >>  Dose adjustments this admission:  Microbiology results: 11/2 BCx: sent         UCx: ordered   Thank you for allowing pharmacy to be a part of this patient's care.  Minda Ditto 03/24/2017 9:18 PM

## 2017-03-25 DIAGNOSIS — L03116 Cellulitis of left lower limb: Secondary | ICD-10-CM | POA: Insufficient documentation

## 2017-03-25 DIAGNOSIS — N183 Chronic kidney disease, stage 3 (moderate): Secondary | ICD-10-CM

## 2017-03-25 DIAGNOSIS — R6520 Severe sepsis without septic shock: Secondary | ICD-10-CM

## 2017-03-25 DIAGNOSIS — N179 Acute kidney failure, unspecified: Secondary | ICD-10-CM

## 2017-03-25 LAB — COMPREHENSIVE METABOLIC PANEL
ALK PHOS: 105 U/L (ref 38–126)
ALT: 18 U/L (ref 14–54)
ANION GAP: 10 (ref 5–15)
AST: 23 U/L (ref 15–41)
Albumin: 2.3 g/dL — ABNORMAL LOW (ref 3.5–5.0)
BILIRUBIN TOTAL: 0.8 mg/dL (ref 0.3–1.2)
BUN: 39 mg/dL — ABNORMAL HIGH (ref 6–20)
CALCIUM: 8 mg/dL — AB (ref 8.9–10.3)
CO2: 21 mmol/L — AB (ref 22–32)
CREATININE: 1.7 mg/dL — AB (ref 0.44–1.00)
Chloride: 104 mmol/L (ref 101–111)
GFR calc Af Amer: 42 mL/min — ABNORMAL LOW (ref >60)
GFR, EST NON AFRICAN AMERICAN: 37 mL/min — AB (ref >60)
Glucose, Bld: 161 mg/dL — ABNORMAL HIGH (ref 65–99)
Potassium: 4.6 mmol/L (ref 3.5–5.1)
SODIUM: 135 mmol/L (ref 135–145)
TOTAL PROTEIN: 7 g/dL (ref 6.5–8.1)

## 2017-03-25 LAB — GLUCOSE, CAPILLARY
GLUCOSE-CAPILLARY: 132 mg/dL — AB (ref 65–99)
Glucose-Capillary: 138 mg/dL — ABNORMAL HIGH (ref 65–99)
Glucose-Capillary: 134 mg/dL — ABNORMAL HIGH (ref 65–99)
Glucose-Capillary: 326 mg/dL — ABNORMAL HIGH (ref 65–99)
Glucose-Capillary: 310 mg/dL — ABNORMAL HIGH (ref 65–99)

## 2017-03-25 LAB — LACTIC ACID, PLASMA: LACTIC ACID, VENOUS: 1.1 mmol/L (ref 0.5–1.9)

## 2017-03-25 LAB — PROTIME-INR
INR: 1.37
PROTHROMBIN TIME: 16.8 s — AB (ref 11.4–15.2)

## 2017-03-25 LAB — CBC
HEMATOCRIT: 25.4 % — AB (ref 36.0–46.0)
Hemoglobin: 8.5 g/dL — ABNORMAL LOW (ref 12.0–15.0)
MCH: 27.5 pg (ref 26.0–34.0)
MCHC: 33.5 g/dL (ref 30.0–36.0)
MCV: 82.2 fL (ref 78.0–100.0)
PLATELETS: 153 10*3/uL (ref 150–400)
RBC: 3.09 MIL/uL — ABNORMAL LOW (ref 3.87–5.11)
RDW: 14.9 % (ref 11.5–15.5)
WBC: 14.5 10*3/uL — AB (ref 4.0–10.5)

## 2017-03-25 LAB — MAGNESIUM: MAGNESIUM: 2.4 mg/dL (ref 1.7–2.4)

## 2017-03-25 LAB — PHOSPHORUS: PHOSPHORUS: 3.7 mg/dL (ref 2.5–4.6)

## 2017-03-25 LAB — PROCALCITONIN
PROCALCITONIN: 7.64 ng/mL
Procalcitonin: 7.64 ng/mL

## 2017-03-25 LAB — HEMOGLOBIN A1C
HEMOGLOBIN A1C: 9 % — AB (ref 4.8–5.6)
Mean Plasma Glucose: 211.6 mg/dL

## 2017-03-25 LAB — MRSA PCR SCREENING: MRSA BY PCR: POSITIVE — AB

## 2017-03-25 LAB — TSH: TSH: 3.582 u[IU]/mL (ref 0.350–4.500)

## 2017-03-25 LAB — APTT: aPTT: 40 seconds — ABNORMAL HIGH (ref 24–36)

## 2017-03-25 LAB — TROPONIN I
Troponin I: <0.03 ng/mL (ref <0.03)
Troponin I: 0.07 ng/mL (ref <0.03)

## 2017-03-25 LAB — SEDIMENTATION RATE: Sed Rate: 130 mm/hr — ABNORMAL HIGH (ref 0–22)

## 2017-03-25 MED ORDER — METRONIDAZOLE IN NACL 5-0.79 MG/ML-% IV SOLN
500.0000 mg | Freq: Three times a day (TID) | INTRAVENOUS | Status: DC
  Administered 2017-03-25 – 2017-04-02 (×24): 500 mg via INTRAVENOUS
  Filled 2017-03-25 (×26): qty 100

## 2017-03-25 MED ORDER — INSULIN GLARGINE 100 UNIT/ML ~~LOC~~ SOLN
40.0000 [IU] | Freq: Every day | SUBCUTANEOUS | Status: DC
  Administered 2017-03-25 – 2017-04-01 (×8): 40 [IU] via SUBCUTANEOUS
  Filled 2017-03-25 (×9): qty 0.4

## 2017-03-25 MED ORDER — PREDNISOLONE ACETATE 1 % OP SUSP
1.0000 [drp] | Freq: Four times a day (QID) | OPHTHALMIC | Status: DC
  Administered 2017-03-25 – 2017-04-02 (×30): 1 [drp] via OPHTHALMIC
  Filled 2017-03-25 (×2): qty 5

## 2017-03-25 MED ORDER — ONDANSETRON HCL 4 MG PO TABS: 4.0000 mg | ORAL_TABLET | Freq: Four times a day (QID) | ORAL | Status: DC | PRN

## 2017-03-25 MED ORDER — ATORVASTATIN CALCIUM 20 MG PO TABS
20.0000 mg | ORAL_TABLET | Freq: Every day | ORAL | Status: DC
  Administered 2017-03-26 – 2017-04-01 (×7): 20 mg via ORAL
  Filled 2017-03-25: qty 1
  Filled 2017-03-25 (×3): qty 2
  Filled 2017-03-25 (×3): qty 1

## 2017-03-25 MED ORDER — INSULIN ASPART 100 UNIT/ML ~~LOC~~ SOLN
0.0000 [IU] | SUBCUTANEOUS | Status: DC
  Administered 2017-03-25 (×2): 1 [IU] via SUBCUTANEOUS
  Administered 2017-03-25: 7 [IU] via SUBCUTANEOUS
  Administered 2017-03-25: 5 [IU] via SUBCUTANEOUS
  Administered 2017-03-25: 1 [IU] via SUBCUTANEOUS
  Administered 2017-03-26: 3 [IU] via SUBCUTANEOUS
  Administered 2017-03-26: 2 [IU] via SUBCUTANEOUS
  Administered 2017-03-26: 3 [IU] via SUBCUTANEOUS
  Administered 2017-03-26: 2 [IU] via SUBCUTANEOUS
  Administered 2017-03-26: 3 [IU] via SUBCUTANEOUS
  Administered 2017-03-26: 7 [IU] via SUBCUTANEOUS
  Administered 2017-03-27: 2 [IU] via SUBCUTANEOUS
  Administered 2017-03-27 (×2): 1 [IU] via SUBCUTANEOUS
  Administered 2017-03-27: 3 [IU] via SUBCUTANEOUS
  Administered 2017-03-27: 1 [IU] via SUBCUTANEOUS
  Administered 2017-03-28 (×3): 3 [IU] via SUBCUTANEOUS
  Administered 2017-03-28 – 2017-03-29 (×4): 1 [IU] via SUBCUTANEOUS
  Administered 2017-03-29 (×2): 2 [IU] via SUBCUTANEOUS
  Administered 2017-03-29: 1 [IU] via SUBCUTANEOUS
  Administered 2017-03-29 – 2017-03-30 (×4): 2 [IU] via SUBCUTANEOUS
  Administered 2017-03-30: 3 [IU] via SUBCUTANEOUS
  Administered 2017-03-30 (×2): 2 [IU] via SUBCUTANEOUS
  Administered 2017-03-30: 3 [IU] via SUBCUTANEOUS
  Administered 2017-03-31 (×3): 2 [IU] via SUBCUTANEOUS
  Administered 2017-03-31: 1 [IU] via SUBCUTANEOUS
  Administered 2017-03-31: 5 [IU] via SUBCUTANEOUS
  Administered 2017-03-31: 3 [IU] via SUBCUTANEOUS
  Administered 2017-04-01 (×3): 2 [IU] via SUBCUTANEOUS
  Administered 2017-04-01: 3 [IU] via SUBCUTANEOUS
  Administered 2017-04-01: 1 [IU] via SUBCUTANEOUS
  Administered 2017-04-02: 5 [IU] via SUBCUTANEOUS
  Administered 2017-04-02: 2 [IU] via SUBCUTANEOUS
  Administered 2017-04-02: 3 [IU] via SUBCUTANEOUS
  Administered 2017-04-02: 2 [IU] via SUBCUTANEOUS

## 2017-03-25 MED ORDER — DOXYCYCLINE HYCLATE 100 MG IV SOLR
100.0000 mg | Freq: Two times a day (BID) | INTRAVENOUS | Status: DC
  Administered 2017-03-25 – 2017-04-01 (×16): 100 mg via INTRAVENOUS
  Filled 2017-03-25 (×17): qty 100

## 2017-03-25 MED ORDER — MUPIROCIN 2 % EX OINT
1.0000 "application " | TOPICAL_OINTMENT | Freq: Two times a day (BID) | CUTANEOUS | Status: AC
  Administered 2017-03-25 – 2017-03-30 (×10): 1 via NASAL
  Filled 2017-03-25 (×4): qty 22

## 2017-03-25 MED ORDER — PREGABALIN 100 MG PO CAPS
100.0000 mg | ORAL_CAPSULE | Freq: Two times a day (BID) | ORAL | Status: DC
  Administered 2017-03-25 – 2017-04-02 (×15): 100 mg via ORAL
  Filled 2017-03-25 (×15): qty 1

## 2017-03-25 MED ORDER — SODIUM CHLORIDE 0.9 % IV SOLN
INTRAVENOUS | Status: AC
  Administered 2017-03-25: 04:00:00 via INTRAVENOUS

## 2017-03-25 MED ORDER — SODIUM CHLORIDE 0.9 % IV SOLN
1000.0000 mL | INTRAVENOUS | Status: DC
  Administered 2017-03-26 – 2017-03-27 (×2): 1000 mL via INTRAVENOUS

## 2017-03-25 MED ORDER — HYDROCODONE-ACETAMINOPHEN 5-325 MG PO TABS
1.0000 | ORAL_TABLET | ORAL | Status: DC | PRN
  Administered 2017-03-25: 1 via ORAL
  Administered 2017-03-25 – 2017-04-02 (×19): 2 via ORAL
  Filled 2017-03-25 (×10): qty 2
  Filled 2017-03-25: qty 1
  Filled 2017-03-25 (×11): qty 2

## 2017-03-25 MED ORDER — CHLORHEXIDINE GLUCONATE CLOTH 2 % EX PADS
6.0000 | MEDICATED_PAD | Freq: Every day | CUTANEOUS | Status: AC
  Administered 2017-03-25 – 2017-03-29 (×4): 6 via TOPICAL

## 2017-03-25 MED ORDER — HYDROMORPHONE HCL 1 MG/ML IJ SOLN
1.0000 mg | INTRAMUSCULAR | Status: AC | PRN
  Administered 2017-03-25 – 2017-03-26 (×2): 1 mg via INTRAVENOUS
  Filled 2017-03-25 (×2): qty 1

## 2017-03-25 MED ORDER — ACETAMINOPHEN 325 MG PO TABS: 650.0000 mg | ORAL_TABLET | Freq: Four times a day (QID) | ORAL | Status: DC | PRN

## 2017-03-25 MED ORDER — AMITRIPTYLINE HCL 25 MG PO TABS
50.0000 mg | ORAL_TABLET | Freq: Every day | ORAL | Status: DC
  Administered 2017-03-25 – 2017-04-01 (×8): 50 mg via ORAL
  Filled 2017-03-25 (×8): qty 2

## 2017-03-25 MED ORDER — ACETAMINOPHEN 650 MG RE SUPP: 650.0000 mg | Freq: Four times a day (QID) | RECTAL | Status: DC | PRN

## 2017-03-25 MED ORDER — ONDANSETRON HCL 4 MG/2ML IJ SOLN: 4.0000 mg | Freq: Four times a day (QID) | INTRAMUSCULAR | Status: DC | PRN

## 2017-03-25 NOTE — Progress Notes (Signed)
[facility] PCCM Attending  S: Feels  Better this morning Hasn't been out of bed  Past Medical History:  Diagnosis Date  . Abnormal Pap smear of cervix 2009  . Abscess    history of multiple abscesses  . Anemia of chronic disease 2002  . Anxiety   . Bipolar disorder ([place])   . Cellulitis 05/21/2014   right eye  . Chronic bronchitis ([place])    "get it q yr" (05/13/2013)  . Chronic pain   . Depression   . Edema of lower extremity   . Endocarditis 2002   subacute bacterial endocarditis.   . Family history of anesthesia complication    "my mom has a hard time coming out from under"  . GERD (gastroesophageal reflux disease)   . Heart murmur   . History of blood transfusion    "just low blood count" (05/13/2013)  . Hyperlipidemia   . Hypertension   . Hypothyroidism   . Hypothyroidism, adult 03/21/2014  . Obesity   . OSA on CPAP   . Peripheral neuropathy   . Type II diabetes mellitus ([place])    O: Vitals:   03/25/17 0401 03/25/17 0800 03/25/17 0817 03/25/17 0844  BP:   (!) 87/53 (!) 107/47  Pulse:   98 100  Resp:   18 20  Temp: 99.2 F (37.3 C) 97.6 F (36.4 C)    TempSrc: Axillary Oral    SpO2:   93% 94%  Weight:      Height:       RA  General:  Resting comfortably in bed HENT: NCAT OP clear PULM: CTA B, normal effort CV: RRR, no mgr GI: BS+, soft, nontender MSK: left leg red, warm, tender below knee, no clear skin breakdown other than a small < 1cm blister which appears intact on heel Neuro: awake, alert, no distress, MAEW  CBC    Component Value Date/Time   WBC 14.5 (H) 03/25/2017 0445   RBC 3.09 (L) 03/25/2017 0445   HGB 8.5 (L) 03/25/2017 0445   HGB 12.1 08/13/2015 1629   HCT 25.4 (L) 03/25/2017 0445   HCT 36.3 08/13/2015 1629   PLT 153 03/25/2017 0445   PLT 232 08/13/2015 1629   MCV 82.2 03/25/2017 0445   MCV 79 08/13/2015 1629   MCH 27.5 03/25/2017 0445   MCHC 33.5 03/25/2017 0445   RDW 14.9 03/25/2017 0445   RDW 15.3 08/13/2015 1629   LYMPHSABS 2.0  03/24/2017 1904   MONOABS 1.4 (H) 03/24/2017 1904   EOSABS 0.0 03/24/2017 1904   BASOSABS 0.0 03/24/2017 1904   BMET    Component Value Date/Time   NA 132 (L) 03/24/2017 1909   NA 138 09/07/2016 1526   K 4.7 03/24/2017 1909   CL 97 (L) 03/24/2017 1909   CO2 26 03/24/2017 1904   GLUCOSE 222 (H) 03/24/2017 1909   BUN 39 (H) 03/24/2017 1909   BUN 24 09/07/2016 1526   CREATININE 1.90 (H) 03/24/2017 1909   CREATININE 0.86 02/03/2014 1017   CALCIUM 8.6 (L) 03/24/2017 1904   GFRNONAA 33 (L) 03/24/2017 1904   GFRNONAA 83 07/23/2013 1604   GFRAA 38 (L) 03/24/2017 1904   GFRAA >89 07/23/2013 1604    Impression/Plan:  Sepsis secondary to cellulitis: lactic acid stable, soft BP but HR, WBC improved, would continue IVF and antibiotics as you are doing; I think an MRI of the foot may be helpful; would mobilize today if able; agree with current antibiotics as ordered  PCCM available prn  Ruby Cola  [place], MD [place] PCCM Pager: [phone] Cell: [phone] After 3pm or if no response, call [phone]

## 2017-03-25 NOTE — Progress Notes (Signed)
Pt asked what if she didn't want any more treatment. RN explained to pt diagnosis and what medications she is receiving and that she would get much worse if we did not treat her. Pt stated "I don't want y'all to do anything". RN asked pt if she was saying she wanted to go home. Pt stated she did not want to go home because "I wont get any peace at home" RN encouraged pt to continue treatment. Pt agreeable. Pt stated she had family but " no one you need to talk to. I'd actually prefer you don't call them.  Notified MD of what pt expressed. Will continue to monitor.

## 2017-03-25 NOTE — Progress Notes (Signed)
[facility] PROGRESS NOTE  [name] [name] GNO:[id number] DOB: [birth year] DOA: 03/24/2017 PCP: Colbert Ewing, MD  Interim summary and HPI 41 y.o. female with medical history significant of necrotizing fasciitis of the groin, diabetic neuropathy,  T2DM, HTN, OSA on CPAP, CKD stage 2-3and Obesity chronic back pain and bipolar disorder; admitted secondary to severe sepsis secondary to left lower extremity cellulitis.  Assessment/Plan: 1-severe sepsis: Appears to be secondary to left lower extremity cellulitis Patient met sepsis criteria with active hypertension at the moment of admission (which responded appropriately to fluid resuscitation and stopping home antihypertensive regimens) -Will continue IV fluids, IV antibiotics and supportive care -Follow clinical response and adjust regimen according to cultures results. -Track lactic acid and procalcitonin. -No need for pressors or further invasive treatment at this moment. -Appreciate PCCM assistance and support.  2-acute on chronic renal failure: Stage II-III at baseline. -Will hold nephrotoxic agents -Continue IV fluids -Follow renal function trend. -If patient failed to improve we will check renal ultrasound. -No signs of infection in her urine.  3-diabetes with renal manifestation and neuropathy -Continue sliding scale insulin -Continue Lyrica  4-depression -Will continue Elavil -Psychiatry has been consulted  5-essential hypertension -Continue holding antihypertensive regimen for now -Follow vital signs slowly resume BP meds as needed.  6-obstructive sleep apnea -Continue CPAP QHS  7-morbid obesity -Body mass index is 49.66 kg/m.  -Low calorie diet and weight loss has been discussed with patient  8-hyperlipidemia. -Will resume statins.  9-chronic diastolic heart failure Compensated and at this moment Follow heart healthy diet -Daily weights in his strict input and output.  Code Status: Full  code Family Communication: No family at bedside.  Patient requested not to contact family member for updates. Disposition Plan: Remains in a stepdown unit.  Continue current IV antibiotics and supportive care.  Follow lactic acid and culture results.  Psychiatry nonemergent consultation requested.   Consultants:  PCCM  Procedures:  See below for x-ray reports.  Antibiotics:  Cefepime, Flagyl and doxycycline 11/2  HPI/Subjective: Depressed, not feeling good.  Denies chest pain, shortness of breath, abdominal pain, nausea, vomiting or any other complaints.  Express left lower extremity pain.  Objective: Vitals:   03/25/17 1617 03/25/17 1621  BP: (!) 197/92 (!) 178/90  Pulse: (!) 132 (!) 129  Resp: 15 18  Temp:    SpO2: 91% 90%    Intake/Output Summary (Last 24 hours) at 03/25/17 1904 Last data filed at 03/25/17 1613  Gross per 24 hour  Intake             5020 ml  Output             1750 ml  Net             3270 ml   Filed Weights   03/24/17 1932 03/25/17 0400  Weight: (!) 149.7 kg (330 lb) (!) 157 kg (346 lb 2 oz)    Exam:   General: Spiking fever, no chest pain, no abdominal pain, no shortness of breath.  Patient with son improvement in her BP and map overall.  With some ongoing depression and pain in her left lower extremity.  Cardiovascular: S1 and S2, no rubs, no gallops  Respiratory: Good air movement bilaterally, no wheezing, no crackles  Abdomen: Soft, nontender, nondistended, positive bowel sounds  Musculoskeletal: Left lower extremity with swelling, erythema, tenderness to palpation extending all the way from her foo tot below her knee.  No cyanosis or active drainage appreciated.  Rest of her extremities  appears to be stable and without signs of active infection.  Data Reviewed: Basic Metabolic Panel:  Recent Labs Lab 03/24/17 1904 03/24/17 1909 03/25/17 0913  NA 132* 132* 135  K 4.8 4.7 4.6  CL 96* 97* 104  CO2 26  --  21*  GLUCOSE 221* 222*  161*  BUN 40* 39* 39*  CREATININE 1.85* 1.90* 1.70*  CALCIUM 8.6*  --  8.0*  MG  --   --  2.4  PHOS  --   --  3.7   Liver Function Tests:  Recent Labs Lab 03/24/17 1904 03/25/17 0913  AST 19 23  ALT 17 18  ALKPHOS 118 105  BILITOT 0.8 0.8  PROT 7.8 7.0  ALBUMIN 2.7* 2.3*   CBC:  Recent Labs Lab 03/24/17 1904 03/24/17 1909 03/25/17 0445  WBC 19.5*  --  14.5*  NEUTROABS 16.1*  --   --   HGB 9.6* 10.2* 8.5*  HCT 29.4* 30.0* 25.4*  MCV 80.8  --  82.2  PLT 236  --  153   Cardiac Enzymes:  Recent Labs Lab 03/24/17 2324 03/25/17 0913  CKTOTAL 85  --   TROPONINI <0.03 <0.03   CBG:  Recent Labs Lab 03/25/17 0358 03/25/17 0744 03/25/17 1149 03/25/17 1541  GLUCAP 132* 138* 134* 326*    Recent Results (from the past 240 hour(s))  Blood Culture (routine x 2)     Status: None (Preliminary result)   Collection Time: 03/24/17  6:28 PM  Result Value Ref Range Status   Specimen Description BLOOD RIGHT ANTECUBITAL  Final   Special Requests IN PEDIATRIC BOTTLE Blood Culture adequate volume  Final   Culture   Final    NO GROWTH < 24 HOURS Performed at [facility], [place] [address] [place], [place] [address]    Report Status PENDING  Incomplete  Blood Culture (routine x 2)     Status: None (Preliminary result)   Collection Time: 03/24/17  7:04 PM  Result Value Ref Range Status   Specimen Description BLOOD LEFT ANTECUBITAL  Final   Special Requests   Final    BOTTLES DRAWN AEROBIC AND ANAEROBIC Blood Culture results may not be optimal due to an inadequate volume of blood received in culture bottles   Culture   Final    NO GROWTH < 24 HOURS Performed at [facility], [place] [address], [place], [place] [address]    Report Status PENDING  Incomplete  MRSA PCR Screening     Status: Abnormal   Collection Time: 03/25/17  3:53 AM  Result Value Ref Range Status   MRSA by PCR POSITIVE (A) NEGATIVE Final    Comment:        The GeneXpert MRSA Assay  (FDA approved for NASAL specimens only), is one component of a comprehensive MRSA colonization surveillance program. It is not intended to diagnose MRSA infection nor to guide or monitor treatment for MRSA infections. RESULT CALLED TO, READ BACK BY AND VERIFIED WITH: FRANKLIN,S AT 1055 ON [id number] BY HOOKER,B      Studies: Ct Foot Left Wo Contrast  Result Date: 03/24/2017 CLINICAL DATA:  Bilateral lower extremity swelling x2 days. EXAM: CT OF THE LEFT FOOT WITHOUT CONTRAST TECHNIQUE: Multidetector CT imaging of the left foot was performed according to the standard protocol. Multiplanar CT image reconstructions were also generated. COMPARISON:  II 18 foot radiographs. FINDINGS: Bones/Joint/Cartilage Degenerative subchondral cysts of the medial, middle and lateral cuneiform secondary to osteoarthritis across navicular-cuneiform as well as  the second and third TMT joints. No fracture or suspicious osseous lesions. Dorsal calcaneal enthesopathy. No joint dislocations. Ligaments Suboptimally assessed by CT. Muscles and Tendons No significant muscle atrophy. No intramuscular hemorrhage. The tendons crossing the ankle joint demonstrate no evidence of tenosynovitis or tendinopathy. Soft tissues Diffuse skin thickening and subcutaneous soft tissue induration consistent cellulitis and/or third spacing. Numerous calcified phleboliths are seen within the subcutaneous soft tissues about the ankle and foot. IMPRESSION: 1. Diffuse skin thickening and subcutaneous soft tissue edema consistent with cellulitis and/or third spacing of fluid possibly from chronic venous insufficiency. 2. Midfoot osteoarthritis with subcortical and subchondral cystic change of the cuneiforms. No acute fracture or bone destruction. 3. Prominent dorsal calcaneal enthesophyte. Electronically Signed   By: Ashley Royalty M.D.   On: 03/24/2017 22:37   Dg Chest Portable 1 View  Result Date: 03/24/2017 CLINICAL DATA:  41 year old female with  sepsis. EXAM: PORTABLE CHEST 1 VIEW COMPARISON:  Chest radiograph dated 07/27/2012 FINDINGS: There is borderline cardiomegaly. Mild prominence of the central vasculature likely representing mild vascular congestion. There is no focal consolidation, pleural effusion, or pneumothorax. No acute osseous pathology. IMPRESSION: Borderline cardiomegaly with probable mild vascular congestion. No focal consolidation. Electronically Signed   By: Anner Crete M.D.   On: 03/24/2017 19:07   Dg Foot Complete Left  Result Date: 03/24/2017 CLINICAL DATA:  Swelling in both lower extremities with chest pain and shortness of breath. EXAM: LEFT FOOT - COMPLETE 3+ VIEW COMPARISON:  None. FINDINGS: Cutaneous calcifications along the lower calf probably from venous insufficiency. Soft tissue swelling in the lower calf and foot. Dorsal spurring in the midfoot. Normal alignment at the Lisfranc joint. No acute bony findings. IMPRESSION: 1. Diffuse soft tissue swelling in the foot and lower calf. Calcifications in the cutaneous tissues the lower calf are probably from venous insufficiency. 2. Dorsal midfoot spurring. Electronically Signed   By: Van Clines M.D.   On: 03/24/2017 19:32    Scheduled Meds: . acetaminophen  1,000 mg Oral Once  . amitriptyline  50 mg Oral QHS  . atorvastatin  20 mg Oral q1800  . Chlorhexidine Gluconate Cloth  6 each Topical Q0600  . insulin aspart  0-9 Units Subcutaneous Q4H  . insulin glargine  40 Units Subcutaneous QHS  . mupirocin ointment  1 application Nasal BID  . prednisoLONE acetate  1 drop Right Eye QID  . pregabalin  100 mg Oral BID   Continuous Infusions: . sodium chloride    . ceFEPime (MAXIPIME) IV Stopped (03/25/17 1221)  . doxycycline (VIBRAMYCIN) IV Stopped (03/25/17 1004)  . metronidazole Stopped (03/25/17 1521)    Active Problems:   Hyperlipidemia   Essential hypertension   Diabetic peripheral neuropathy (HCC)   Uncontrolled type 2 diabetes mellitus (HCC)    OSA on CPAP   CKD (chronic kidney disease) stage 2, GFR 60-89 ml/min   Severe sepsis (HCC)   Hyponatremia   Abnormal ECG   Sepsis ([place])    Time spent: 30 minutes    Barton Dubois  [facility] Pager [phone]. If 7PM-7AM, please contact night-coverage at [email or web address], password [facility] 03/25/2017, 7:04 PM  LOS: 1 day

## 2017-03-25 NOTE — Consult Note (Signed)
[facility] (OTS) Consult   Patient ID: [name] [name] MRN: [id number] DOB/AGE: Jul 01, 1975 41 y.o.  Reason for Consult: Left leg cellulitis Referring Physician: Dr. Toy Baker, MD [facility]  HPI: [name] [name] is an 41 y.o. female who is being seen in consultation at the request of Dr. Roel Cluck for evaluation of the left lower extremity cellulitis. [name] is a past medical history significant for diabetic neuropathy type 2 insulin-dependent diabetic, hypertension, obstructive sleep apnea, obesity, bipolar disorder and chronic kidney disease stage III.  She also has a history of necrotizing fasciitis in her groin.  She presented to the emergency room with subjective fever and increased erythema and swelling of her left lower extremity.  She has baseline neuropathy so her feeling is not fully there but she states that she was having shooting pains as well.  She was brought into the emergency room and she was subsequently admitted started on IV antibiotics for sepsis.  Upon her arrival she was acidotic with a lactate of 2.21 and a pH on a blood draw of 7.336.  She also had a leukocytosis of 19.5.  She also had increased creatinine at 1.8.  I was consulted for evaluation of her left lower extremity.  I reviewed images that were in the chart from yesterday evening.  Currently this morning she is improved.  Her white blood cell count has improved as has her lactic acidosis.  She is still having pain in her foot but overall she has no other complaints and has not gotten up on her leg quite yet.  She denies any current fevers or chills.  She does note that she is not very compliant with her diabetic medication regimen.  She does state that over the last month or so she has been the most compliant that she has been recently.  She says that her hemoglobin A1c runs in the 14-16s, I see a hemoglobin A1c in the epic chart that shows that it was 11.3 in July 2018.  Past  Medical History:  Diagnosis Date  . Abnormal Pap smear of cervix 2009  . Abscess    history of multiple abscesses  . Anemia of chronic disease 2002  . Anxiety   . Bipolar disorder ([place])   . Cellulitis 05/21/2014   right eye  . Chronic bronchitis ([place])    "get it q yr" (05/13/2013)  . Chronic pain   . Depression   . Edema of lower extremity   . Endocarditis 2002   subacute bacterial endocarditis.   . Family history of anesthesia complication    "my mom has a hard time coming out from under"  . GERD (gastroesophageal reflux disease)   . Heart murmur   . History of blood transfusion    "just low blood count" (05/13/2013)  . Hyperlipidemia   . Hypertension   . Hypothyroidism   . Hypothyroidism, adult 03/21/2014  . Obesity   . OSA on CPAP   . Peripheral neuropathy   . Type II diabetes mellitus ([place])     Past Surgical History:  Procedure Laterality Date  . INCISION AND DRAINAGE ABSCESS     multiple I&Ds  . INCISION AND DRAINAGE ABSCESS Left 07/09/2012   Procedure: DRESSING CHANGE, THIGH WOUND;  Surgeon: Harl Bowie, MD;  Location: [place];  Service: General;  Laterality: Left;  . INCISION AND DRAINAGE OF WOUND Left 07/07/2012   Procedure: IRRIGATION AND DEBRIDEMENT WOUND;  Surgeon: Harl Bowie, MD;  Location: [place];  Service: General;  Laterality: Left;  . INCISION AND DRAINAGE PERIRECTAL ABSCESS Left 07/14/2012   Procedure: DEBRIDEMENT OF SKIN & SOFT TISSUE; DRESSING CHANGE UNDER ANESTHESIA;  Surgeon: Gayland Curry, MD,FACS;  Location: [place];  Service: General;  Laterality: Left;  . INCISION AND DRAINAGE PERIRECTAL ABSCESS Left 07/16/2012   Procedure: I&D Left Thigh;  Surgeon: Gwenyth Ober, MD;  Location: [place];  Service: General;  Laterality: Left;  . INCISION AND DRAINAGE PERIRECTAL ABSCESS N/A 01/05/2015   Procedure: IRRIGATION AND DEBRIDEMENT PERIRECTAL ABSCESS;  Surgeon: Donnie Mesa, MD;  Location: [place];  Service: General;  Laterality: N/A;  . IRRIGATION AND  DEBRIDEMENT ABSCESS Left 07/06/2012   Procedure: IRRIGATION AND DEBRIDEMENT ABSCESS BUTTOCKS AND THIGH;  Surgeon: Shann Medal, MD;  Location: [place];  Service: General;  Laterality: Left;  . IRRIGATION AND DEBRIDEMENT ABSCESS Left 08/10/2012   Procedure: IRRIGATION AND DEBRIDEMENT ABSCESS;  Surgeon: Madilyn Hook, DO;  Location: [place];  Service: General;  Laterality: Left;  . TONSILLECTOMY  1994    Family History  Problem Relation Age of Onset  . Heart failure Mother   . Diabetes Mother   . Kidney disease Mother   . Kidney disease Father   . Diabetes Father   . Diabetes Paternal Grandmother   . Heart failure Paternal Grandmother   . Other Other     Social History:  reports that she has been smoking Cigarettes.  She has a 3.00 pack-year smoking history. She has never used smokeless tobacco. She reports that she drinks alcohol. She reports that she does not use drugs.  Allergies:  Allergies  Allergen Reactions  . Vancomycin Other (See Comments)    Acute renal failure suspected secondary to vanco    Medications:  No current facility-administered medications on file prior to encounter.    Current Outpatient Prescriptions on File Prior to Encounter  Medication Sig Dispense Refill  . amitriptyline (ELAVIL) 50 MG tablet Take 1 tablet (50 mg total) by mouth at bedtime. 90 tablet 3  . atorvastatin (LIPITOR) 20 MG tablet TAKE 1 TABLET BY MOUTH EVERY DAY 30 tablet 11  . cyclobenzaprine (FLEXERIL) 10 MG tablet Take 1 tablet (10 mg total) by mouth every 12 (twelve) hours as needed for muscle spasms. 60 tablet 3  . furosemide (LASIX) 20 MG tablet Take 2 tablets (40 mg total) by mouth daily as needed for edema. 90 tablet 0  . insulin glargine (LANTUS) 100 UNIT/ML injection Inject 44 units below the skin at bedtime. Diagnosis code:  E11.65, Z79.4 30 mL 1  . INVOKANA 300 MG TABS tablet Take 1 tablet by mouth daily before breakfast 30 tablet 3  . liraglutide (VICTOZA) 18 MG/3ML SOPN Inject 0.3  mLs (1.8 mg total) into the skin daily. 9 mL 3  . lisinopril (PRINIVIL,ZESTRIL) 20 MG tablet TAKE 1 TABLET BY MOUTH EVERY DAY 90 tablet 3  . LYRICA 100 MG capsule TAKE 1 CAPSULE BY MOUTH 2 TIMES DAILY 60 capsule 0  . NOVOLOG FLEXPEN 100 UNIT/ML FlexPen Inject 14 units below the skin 3 times daily with meals 15 mL 1  . prednisoLONE acetate (PRED FORTE) 1 % ophthalmic suspension Place 1 drop into the right eye 4 (four) times daily. 10 mL 0  . VOLTAREN 1 % GEL Apply 4 grams to affected area (topically) 4 times daily 100 g 2  . ACCU-CHEK FASTCLIX LANCETS MISC Check blood sugar 3 times a day before meals 102 each 12  . Blood Glucose Monitoring Suppl (ACCU-CHEK  AVIVA PLUS) w/Device KIT 1 each by Does not apply route 4 (four) times daily. 1 kit 1  . glucose blood (ACCU-CHEK AVIVA PLUS) test strip Check blood sugar 4 times a day before meals 150 each 3  . Insulin Pen Needle (B-D UF III MINI PEN NEEDLES) 31G X 5 MM MISC Use 3 per day to inject Insulin 100 each 5  . Insulin Syringe-Needle U-100 31G X 15/64" 0.5 ML MISC Use to inject insulin as isntructed 100 each 6    ROS: Constitutional: +Fevers and chills Vision: No changes in vision ENT: No difficulty swallowing CV: No chest pain Pulm: No SOB or wheezing GI: No nausea or vomiting GU: No urgency or inability to hold urine Skin: No poor wound healing Neurologic: +Numbness and neuroptahy Psychiatric: + depression or anxiety Heme: No bruising Allergic: No reaction to medications or food   Exam: Blood pressure (!) 111/95, pulse 99, temperature 99.2 F (37.3 C), temperature source Axillary, resp. rate 16, height '5\' 10"'  (1.778 m), weight (!) 157 kg (346 lb 2 oz), last menstrual period 02/22/2017, SpO2 99 %. General:No acute distress Orientation:Awake, alert and oriented Mood and Affect: Cooperative Gait: Not assessed as patient was in stepdown unit Coordination and balance: Normal  Left lower extremity: There is significant swelling and pitting  edema compared to the contralateral leg.  This goes all the way up to into the proximal tibia.  There is no areas of fluctuance other than a small area around the first webspace.  This is not increased in size from the images that I reviewed in epic yesterday evening.  The dorsum and plantar surface of the foot are without any fluctuance.  There is some color change of the skin over the plantar aspect of the first metatarsal head.  There is some fluctuance in this area as well.  Sensation is blunted however she is able to feel me touch her.  I did not perform a Semmes-Weinstein test.  Her foot is warm and notable erythema in the entire leg.  There appears to be chronic venous stasis changes noted as well.  There are no open wounds or open sores on her foot.  Right lower extremity: Notable for some chronic venous stasis changes.  No pitting edema no erythema and no warmth.  No open wounds on the plantar surface of her foot.  No tenderness to palpation. Full painless ROM, full strength in each muscle groups without evidence of instability.   Medical Decision Making: Imaging: X-rays of her left foot were reviewed which show no osseous changes that are consistent with any osteomyelitis or acute bone infection.  CT scan was reviewed as well.  There is some changes in the skin and subcutaneous tissue consistent with cellulitis.  However I was not able to appreciate any focal collections on the scan.  Even in the area of the first webspace is difficult to fully assess on the CT scan but I do not see any area of abscess or collection.  Labs: WBC 19.5-->14.5 Lactic acid: 2.21-->1.1 Hemoglobin 9.6-->8.5 Glucose 222 Blood cultures collected   Medical history and chart was reviewed  Assessment/Plan: 41 year old female with history of T2IDDM, peripheral neuropathy, chronic kidney disease, and previous necrotizing fascitis with left lower extremity cellulitis  Her clinical picture this morning is much  improved from last night.  It does not appear that she is acidotic.  Her lower extremity is relatively benign.  There is only a small area of concern in the first  webspace and plantar aspect of her foot.  I do not feel I&D is warranted at this juncture.  I would continue with IV antibiotics and monitor her for worsening of her infection.  However I am encouraged by the improvement in her lactic acid as well as her leukocytosis.  I did discuss with the patient the need for compliance with her diabetic regimen and proper foot care and she is at high risk for diabetic wounds and infections especially with her previous history of necrotizing fasciitis.  Recommendations: -No surgical intervention planned at this point -Continue IV antibiotics -Monitor clinical picture -Do not feel MRI is needed at this point due to improvement of laboratory markers. -Will continue to follow   Shona Needles, MD [facility] [phone] (phone)

## 2017-03-25 NOTE — Progress Notes (Signed)
CRITICAL VALUE ALERT  Critical Value:  Troponin - 0.07   Date & Time Notied: 03/25/2017 2035  Provider Notified: Dr. Maudie Mercury  Orders Received/Actions taken: Awaiting further orders.

## 2017-03-26 ENCOUNTER — Other Ambulatory Visit: Payer: Self-pay

## 2017-03-26 DIAGNOSIS — Z736 Limitation of activities due to disability: Secondary | ICD-10-CM

## 2017-03-26 DIAGNOSIS — F4321 Adjustment disorder with depressed mood: Secondary | ICD-10-CM

## 2017-03-26 DIAGNOSIS — H538 Other visual disturbances: Secondary | ICD-10-CM

## 2017-03-26 DIAGNOSIS — F17210 Nicotine dependence, cigarettes, uncomplicated: Secondary | ICD-10-CM

## 2017-03-26 DIAGNOSIS — F329 Major depressive disorder, single episode, unspecified: Secondary | ICD-10-CM

## 2017-03-26 DIAGNOSIS — G4700 Insomnia, unspecified: Secondary | ICD-10-CM

## 2017-03-26 DIAGNOSIS — Z560 Unemployment, unspecified: Secondary | ICD-10-CM

## 2017-03-26 DIAGNOSIS — R509 Fever, unspecified: Secondary | ICD-10-CM

## 2017-03-26 DIAGNOSIS — R6883 Chills (without fever): Secondary | ICD-10-CM

## 2017-03-26 HISTORY — DX: Adjustment disorder with depressed mood: F43.21

## 2017-03-26 LAB — CBC
HEMATOCRIT: 26.4 % — AB (ref 36.0–46.0)
HEMOGLOBIN: 8.4 g/dL — AB (ref 12.0–15.0)
MCH: 25.9 pg — AB (ref 26.0–34.0)
MCHC: 31.8 g/dL (ref 30.0–36.0)
MCV: 81.5 fL (ref 78.0–100.0)
Platelets: 212 10*3/uL (ref 150–400)
RBC: 3.24 MIL/uL — AB (ref 3.87–5.11)
RDW: 15 % (ref 11.5–15.5)
WBC: 14.7 10*3/uL — ABNORMAL HIGH (ref 4.0–10.5)

## 2017-03-26 LAB — GLUCOSE, CAPILLARY
GLUCOSE-CAPILLARY: 294 mg/dL — AB (ref 65–99)
GLUCOSE-CAPILLARY: 198 mg/dL — AB (ref 65–99)
Glucose-Capillary: 151 mg/dL — ABNORMAL HIGH (ref 65–99)
Glucose-Capillary: 205 mg/dL — ABNORMAL HIGH (ref 65–99)
Glucose-Capillary: 230 mg/dL — ABNORMAL HIGH (ref 65–99)
Glucose-Capillary: 220 mg/dL — ABNORMAL HIGH (ref 65–99)
Glucose-Capillary: 221 mg/dL — ABNORMAL HIGH (ref 65–99)

## 2017-03-26 LAB — BASIC METABOLIC PANEL
ANION GAP: 10 (ref 5–15)
BUN: 35 mg/dL — ABNORMAL HIGH (ref 6–20)
CALCIUM: 8.4 mg/dL — AB (ref 8.9–10.3)
CO2: 21 mmol/L — ABNORMAL LOW (ref 22–32)
Chloride: 106 mmol/L (ref 101–111)
Creatinine, Ser: 1.48 mg/dL — ABNORMAL HIGH (ref 0.44–1.00)
GFR, EST AFRICAN AMERICAN: 50 mL/min — AB (ref >60)
GFR, EST NON AFRICAN AMERICAN: 43 mL/min — AB (ref >60)
Glucose, Bld: 207 mg/dL — ABNORMAL HIGH (ref 65–99)
Potassium: 4.8 mmol/L (ref 3.5–5.1)
Sodium: 137 mmol/L (ref 135–145)

## 2017-03-26 LAB — PROCALCITONIN: PROCALCITONIN: 7.29 ng/mL

## 2017-03-26 LAB — HIV ANTIBODY (ROUTINE TESTING W REFLEX): HIV SCREEN 4TH GENERATION: NONREACTIVE

## 2017-03-26 MED ORDER — LORATADINE 10 MG PO TABS
10.0000 mg | ORAL_TABLET | Freq: Every day | ORAL | Status: DC | PRN
  Administered 2017-03-26: 10 mg via ORAL
  Filled 2017-03-26: qty 1

## 2017-03-26 MED ORDER — MORPHINE SULFATE (PF) 4 MG/ML IV SOLN
2.0000 mg | Freq: Four times a day (QID) | INTRAVENOUS | Status: DC | PRN
  Administered 2017-03-26 – 2017-03-29 (×8): 2 mg via INTRAVENOUS
  Filled 2017-03-26 (×9): qty 1

## 2017-03-26 NOTE — Consult Note (Signed)
[facility] [place] Face-to-Face Psychiatry Consult   Reason for Consult:  Depression Referring Physician: Dr. Dyann Kief Patient Identification: [name] [name] MRN:  [id number] Principal Diagnosis: Adjustment disorder with depressed mood Diagnosis:   Patient Active Problem List   Diagnosis Date Noted  . Left leg cellulitis [L03.116]   . Severe sepsis ([place]) [A41.9, R65.20] 03/24/2017  . Hyponatremia [E87.1] 03/24/2017  . Abnormal ECG [R94.31] 03/24/2017  . Sepsis ([place]) [A41.9] 03/24/2017  . Acute bilateral low back pain [M54.5] 02/13/2017  . Heart failure with preserved ejection fraction ([place]) [I50.30] 02/09/2017  . Jerking movements of extremities [R25.2] 12/08/2016  . Routine adult health maintenance [Z00.00] 12/08/2016  . Chronic pain of right knee [M25.561, G89.29] 09/08/2016  . Abdominal muscle pain [M79.18] 09/08/2016  . Vertigo [R42] 08/05/2016  . Lightheadedness [R42] 07/15/2016  . CKD (chronic kidney disease) stage 2, GFR 60-89 ml/min [N18.2] 07/15/2016  . Bilateral lower extremity edema [R60.0] 05/13/2016  . OSA on CPAP [G47.33, Z99.89] 05/13/2016  . Vaginal pruritus [N89.8] 03/14/2016  . Morbid obesity due to excess calories ([place]) [E66.01] 11/27/2015  . Callus of foot [L84] 11/26/2015  . History of Low serum cortisol level ([place]) [E27.40] 01/07/2015  . Financial difficulty [Z59.8] 04/25/2013  . Uncontrolled type 2 diabetes mellitus ([place]) [E11.65] 07/12/2012  . Necrotizing fasciitis s/p OR debridements [M72.6] 07/06/2012  . Carpal tunnel syndrome, bilateral [G56.03] 01/25/2012  . Diabetic peripheral neuropathy ([place]) [E11.42] 07/04/2011  . Anxiety and depression [F41.9, F32.9] 06/02/2011  . History of abnormal cervical Pap smear [Z87.898] 12/13/2007  . GERD [K21.9] 12/06/2007  . Hyperlipidemia [E78.5] 08/29/2007  . Essential hypertension [I10] 08/29/2007    Total Time spent with patient: 1 hour  Subjective:   [name] [name] is a 41 y.o. female patient admitted with sepsis  secondary to left lower extremity cellulitis. Psychiatry was consulted for management of depression.   HPI:   According to nursing, patient has been diagnosed with bipolar disorder. Her care has been difficult to manage due to irritability directed towards her treatment team. She has demanded to be transferred to [facility] since she does not feel like she is receiving the care she needs hre. She reported that she wanted to go home and die. When asked about suicidal thoughts she denied SI.  Ms. Panning reports she is depressed about her current condition but her mood has been good prior to hospitalization. She denies thoughts to harm her self but she does admit to feeling frustrated with her medical problems. She reports that she has been taking Elavil 50 mg qhs for almost a year for pain and depression. She feels like this medication has been helpful. It is prescribed by her PCP, Dr. Benson Norway. She reports problems with sleep although she has OSA and does not wear her CPAP because it is uncomfortable. She reports poor energy related to chronic pain. She denies problems with appetite or anhedonia. She enjoys listening to music, walking and sewing. She denies a history of manic symptoms or AH. She reports seeing ghosts when she was younger but none recently.   Past Psychiatric History: She is diagnosed with depression. She reports overdosing at 41 y/o and cutting in the past.   Risk to Self: Is patient at risk for suicide?: No Risk to Others:  No Prior Inpatient Therapy:   She was hospitalized at 41 y/o for SI.  Prior Outpatient Therapy:  She is prescribed Elavil 50 mg qhs by her PCP, Dr. Benson Norway.  Past Medical History:  Past Medical History:  Diagnosis Date  . Abnormal Pap smear of cervix 2009  . Abscess    history of multiple abscesses  . Anemia of chronic disease 2002  . Anxiety   . Bipolar disorder ([place])   . Cellulitis 05/21/2014   right eye  . Chronic bronchitis ([place])    "get it q yr"  (05/13/2013)  . Chronic pain   . Depression   . Edema of lower extremity   . Endocarditis 2002   subacute bacterial endocarditis.   . Family history of anesthesia complication    "my mom has a hard time coming out from under"  . GERD (gastroesophageal reflux disease)   . Heart murmur   . History of blood transfusion    "just low blood count" (05/13/2013)  . Hyperlipidemia   . Hypertension   . Hypothyroidism   . Hypothyroidism, adult 03/21/2014  . Obesity   . OSA on CPAP   . Peripheral neuropathy   . Type II diabetes mellitus ([place])     Past Surgical History:  Procedure Laterality Date  . INCISION AND DRAINAGE ABSCESS     multiple I&Ds  . TONSILLECTOMY  1994   Family History:  Family History  Problem Relation Age of Onset  . Heart failure Mother   . Diabetes Mother   . Kidney disease Mother   . Kidney disease Father   . Diabetes Father   . Diabetes Paternal Grandmother   . Heart failure Paternal Grandmother   . Other Other    Family Psychiatric  History: Unknown   Social History:  Social History   Substance and Sexual Activity  Alcohol Use Yes  . Alcohol/week: 0.0 oz   Comment: Socially.     Social History   Substance and Sexual Activity  Drug Use No    Social History   Socioeconomic History  . Marital status: Single    Spouse name: None  . Number of children: 0  . Years of education: 51  . Highest education level: None  Social Needs  . Financial resource strain: None  . Food insecurity - worry: None  . Food insecurity - inability: None  . Transportation needs - medical: None  . Transportation needs - non-medical: None  Occupational History  . Occupation: disability  Tobacco Use  . Smoking status: Current Every Day Smoker    Packs/day: 0.50    Years: 6.00    Pack years: 3.00    Types: Cigarettes  . Smokeless tobacco: Never Used  . Tobacco comment: Cutting back  Substance and Sexual Activity  . Alcohol use: Yes    Alcohol/week: 0.0 oz     Comment: Socially.  . Drug use: No  . Sexual activity: Yes  Other Topics Concern  . None  Social History Narrative   Lives with mother currently, goddaughter and uncle in a one story home.     On disability for diabetes, neuropathy, sleep apnea etc.  Disability since 2014.    Education: 11th grade.    Additional Social History: She lives with her mom, nephew and uncle. She has never been married and has no children. She is romantically involved with a man. She is unemployed and has been on disability since 2016 for medical conditions. She previously worked as a Adult nurse. She reports occasional alcohol use and smokes 0.5 ppd since 41 years old. She smoked marijuana when she was younger but denies current use.     Allergies:   Allergies  Allergen Reactions  . Vancomycin Other (  See Comments)    Acute renal failure suspected secondary to vanco    Labs:  Results for orders placed or performed during the hospital encounter of 03/24/17 (from the past 48 hour(s))  Blood Culture (routine x 2)     Status: None (Preliminary result)   Collection Time: 03/24/17  6:28 PM  Result Value Ref Range   Specimen Description BLOOD RIGHT ANTECUBITAL    Special Requests IN PEDIATRIC BOTTLE Blood Culture adequate volume    Culture      NO GROWTH < 24 HOURS Performed at [facility] [address], [place], [place] [address]    Report Status PENDING   Comprehensive metabolic panel     Status: Abnormal   Collection Time: 03/24/17  7:04 PM  Result Value Ref Range   Sodium 132 (L) 135 - 145 mmol/L   Potassium 4.8 3.5 - 5.1 mmol/L   Chloride 96 (L) 101 - 111 mmol/L   CO2 26 22 - 32 mmol/L   Glucose, Bld 221 (H) 65 - 99 mg/dL   BUN 40 (H) 6 - 20 mg/dL   Creatinine, Ser 1.85 (H) 0.44 - 1.00 mg/dL   Calcium 8.6 (L) 8.9 - 10.3 mg/dL   Total Protein 7.8 6.5 - 8.1 g/dL   Albumin 2.7 (L) 3.5 - 5.0 g/dL   AST 19 15 - 41 U/L   ALT 17 14 - 54 U/L   Alkaline Phosphatase 118 38 - 126 U/L    Total Bilirubin 0.8 0.3 - 1.2 mg/dL   GFR calc non Af Amer 33 (L) >60 mL/min   GFR calc Af Amer 38 (L) >60 mL/min    Comment: (NOTE) The eGFR has been calculated using the CKD EPI equation. This calculation has not been validated in all clinical situations. eGFR's persistently <60 mL/min signify possible Chronic Kidney Disease.    Anion gap 10 5 - 15  CBC with Differential     Status: Abnormal   Collection Time: 03/24/17  7:04 PM  Result Value Ref Range   WBC 19.5 (H) 4.0 - 10.5 K/uL   RBC 3.64 (L) 3.87 - 5.11 MIL/uL   Hemoglobin 9.6 (L) 12.0 - 15.0 g/dL   HCT 29.4 (L) 36.0 - 46.0 %   MCV 80.8 78.0 - 100.0 fL   MCH 26.4 26.0 - 34.0 pg   MCHC 32.7 30.0 - 36.0 g/dL   RDW 14.6 11.5 - 15.5 %   Platelets 236 150 - 400 K/uL   Neutrophils Relative % 82 %   Neutro Abs 16.1 (H) 1.7 - 7.7 K/uL   Lymphocytes Relative 11 %   Lymphs Abs 2.0 0.7 - 4.0 K/uL   Monocytes Relative 7 %   Monocytes Absolute 1.4 (H) 0.1 - 1.0 K/uL   Eosinophils Relative 0 %   Eosinophils Absolute 0.0 0.0 - 0.7 K/uL   Basophils Relative 0 %   Basophils Absolute 0.0 0.0 - 0.1 K/uL  Blood Culture (routine x 2)     Status: None (Preliminary result)   Collection Time: 03/24/17  7:04 PM  Result Value Ref Range   Specimen Description BLOOD LEFT ANTECUBITAL    Special Requests      BOTTLES DRAWN AEROBIC AND ANAEROBIC Blood Culture results may not be optimal due to an inadequate volume of blood received in culture bottles   Culture      NO GROWTH < 24 HOURS Performed at [facility], [address], [place], [place] [address]    Report  Status PENDING   I-stat troponin, ED (not at [facility], [facility])     Status: None   Collection Time: 03/24/17  7:07 PM  Result Value Ref Range   Troponin i, poc 0.02 0.00 - 0.08 ng/mL   Comment 3            Comment: Due to the release kinetics of cTnI, a negative result within the first hours of the onset of symptoms does not rule out myocardial infarction with certainty. If  myocardial infarction is still suspected, repeat the test at appropriate intervals.   I-Stat CG4 Lactic Acid, ED     Status: None   Collection Time: 03/24/17  7:08 PM  Result Value Ref Range   Lactic Acid, Venous 1.38 0.5 - 1.9 mmol/L  I-Stat Chem 8, ED     Status: Abnormal   Collection Time: 03/24/17  7:09 PM  Result Value Ref Range   Sodium 132 (L) 135 - 145 mmol/L   Potassium 4.7 3.5 - 5.1 mmol/L   Chloride 97 (L) 101 - 111 mmol/L   BUN 39 (H) 6 - 20 mg/dL   Creatinine, Ser 1.90 (H) 0.44 - 1.00 mg/dL   Glucose, Bld 222 (H) 65 - 99 mg/dL   Calcium, Ion 1.08 (L) 1.15 - 1.40 mmol/L   TCO2 27 22 - 32 mmol/L   Hemoglobin 10.2 (L) 12.0 - 15.0 g/dL   HCT 30.0 (L) 36.0 - 46.0 %  I-Stat Beta hCG blood, ED ([facility], [facility], [facility] only)     Status: None   Collection Time: 03/24/17  7:28 PM  Result Value Ref Range   I-stat hCG, quantitative <5.0 <5 mIU/mL   Comment 3            Comment:   GEST. AGE      CONC.  (mIU/mL)   <=1 WEEK        5 - 50     2 WEEKS       50 - 500     3 WEEKS       100 - 10,000     4 WEEKS     1,000 - 30,000        FEMALE AND NON-PREGNANT FEMALE:     LESS THAN 5 mIU/mL   I-Stat CG4 Lactic Acid, ED     Status: Abnormal   Collection Time: 03/24/17  8:30 PM  Result Value Ref Range   Lactic Acid, Venous 2.21 (HH) 0.5 - 1.9 mmol/L   Comment NOTIFIED PHYSICIAN   Blood gas, arterial ([facility] & [facility] ONLY)     Status: Abnormal (Preliminary result)   Collection Time: 03/24/17  9:08 PM  Result Value Ref Range   FIO2 32.00    O2 Content PENDING L/min   Mode NO CHARGE    pH, Arterial 7.336 (L) 7.350 - 7.450   pCO2 arterial 43.8 32.0 - 48.0 mmHg   pO2, Arterial 107 83.0 - 108.0 mmHg   Bicarbonate 22.8 20.0 - 28.0 mmol/L   Acid-base deficit 2.4 (H) 0.0 - 2.0 mmol/L   O2 Saturation 96.8 %   Patient temperature 98.6    Collection site LEFT RADIAL    Drawn by [id number]    Sample type ARTERIAL DRAW    Allens test (pass/fail) PASS PASS  CK     Status: None   Collection Time: 03/24/17 11:24  PM  Result Value Ref Range   Total CK 85 38 - 234 U/L  Sedimentation rate     Status: Abnormal  Collection Time: 03/24/17 11:24 PM  Result Value Ref Range   Sed Rate 130 (H) 0 - 22 mm/hr  Procalcitonin - Baseline     Status: None   Collection Time: 03/24/17 11:24 PM  Result Value Ref Range   Procalcitonin 7.64 ng/mL    Comment:        Interpretation: PCT > 2 ng/mL: Systemic infection (sepsis) is likely, unless other causes are known. (NOTE)         ICU PCT Algorithm               Non ICU PCT Algorithm    ----------------------------     ------------------------------         PCT < 0.25 ng/mL                 PCT < 0.1 ng/mL     Stopping of antibiotics            Stopping of antibiotics       strongly encouraged.               strongly encouraged.    ----------------------------     ------------------------------       PCT level decrease by               PCT < 0.25 ng/mL       >= 80% from peak PCT       OR PCT 0.25 - 0.5 ng/mL          Stopping of antibiotics                                             encouraged.     Stopping of antibiotics           encouraged.    ----------------------------     ------------------------------       PCT level decrease by              PCT >= 0.25 ng/mL       < 80% from peak PCT        AND PCT >= 0.5 ng/mL            Continuing antibiotics                                               encouraged.       Continuing antibiotics            encouraged.    ----------------------------     ------------------------------     PCT level increase compared          PCT > 0.5 ng/mL         with peak PCT AND          PCT >= 0.5 ng/mL             Escalation of antibiotics                                          strongly encouraged.      Escalation of antibiotics        strongly encouraged.   Lactic acid, plasma  Status: None   Collection Time: 03/24/17 11:24 PM  Result Value Ref Range   Lactic Acid, Venous 0.8 0.5 - 1.9 mmol/L  Protime-INR      Status: Abnormal   Collection Time: 03/24/17 11:24 PM  Result Value Ref Range   Prothrombin Time 16.8 (H) 11.4 - 15.2 seconds   INR 1.37   APTT     Status: Abnormal   Collection Time: 03/24/17 11:24 PM  Result Value Ref Range   aPTT 40 (H) 24 - 36 seconds    Comment:        IF BASELINE aPTT IS ELEVATED, SUGGEST PATIENT RISK ASSESSMENT BE USED TO DETERMINE APPROPRIATE ANTICOAGULANT THERAPY.   Troponin I     Status: None   Collection Time: 03/24/17 11:24 PM  Result Value Ref Range   Troponin I <0.03 <0.03 ng/mL  MRSA PCR Screening     Status: Abnormal   Collection Time: 03/25/17  3:53 AM  Result Value Ref Range   MRSA by PCR POSITIVE (A) NEGATIVE    Comment:        The GeneXpert MRSA Assay (FDA approved for NASAL specimens only), is one component of a comprehensive MRSA colonization surveillance program. It is not intended to diagnose MRSA infection nor to guide or monitor treatment for MRSA infections. RESULT CALLED TO, READ BACK BY AND VERIFIED WITH: FRANKLIN,S AT 1055 ON [id number] BY HOOKER,B   Glucose, capillary     Status: Abnormal   Collection Time: 03/25/17  3:58 AM  Result Value Ref Range   Glucose-Capillary 132 (H) 65 - 99 mg/dL   Comment 1 Notify RN   Lactic acid, plasma     Status: None   Collection Time: 03/25/17  4:16 AM  Result Value Ref Range   Lactic Acid, Venous 1.1 0.5 - 1.9 mmol/L  Hemoglobin A1c     Status: Abnormal   Collection Time: 03/25/17  4:45 AM  Result Value Ref Range   Hgb A1c MFr Bld 9.0 (H) 4.8 - 5.6 %    Comment: (NOTE) Pre diabetes:          5.7%-6.4% Diabetes:              >6.4% Glycemic control for   <7.0% adults with diabetes    Mean Plasma Glucose 211.6 mg/dL    Comment: Performed at [facility], [place] [address], [place], [place] [address]  TSH     Status: None   Collection Time: 03/25/17  4:45 AM  Result Value Ref Range   TSH 3.582 0.350 - 4.500 uIU/mL    Comment: Performed by a 3rd Generation assay with a functional  sensitivity of <=0.01 uIU/mL.  CBC     Status: Abnormal   Collection Time: 03/25/17  4:45 AM  Result Value Ref Range   WBC 14.5 (H) 4.0 - 10.5 K/uL   RBC 3.09 (L) 3.87 - 5.11 MIL/uL   Hemoglobin 8.5 (L) 12.0 - 15.0 g/dL   HCT 25.4 (L) 36.0 - 46.0 %   MCV 82.2 78.0 - 100.0 fL   MCH 27.5 26.0 - 34.0 pg   MCHC 33.5 30.0 - 36.0 g/dL   RDW 14.9 11.5 - 15.5 %   Platelets 153 150 - 400 K/uL  Glucose, capillary     Status: Abnormal   Collection Time: 03/25/17  7:44 AM  Result Value Ref Range   Glucose-Capillary 138 (H) 65 - 99 mg/dL   Comment 1 Notify RN    Comment 2 Document in  Chart   HIV antibody     Status: None   Collection Time: 03/25/17  9:13 AM  Result Value Ref Range   HIV Screen 4th Generation wRfx Non Reactive Non Reactive    Comment: (NOTE) Performed At: [facility] [place], [place] [id number] Rush Farmer MD LK:[id number]   Procalcitonin     Status: None   Collection Time: 03/25/17  9:13 AM  Result Value Ref Range   Procalcitonin 7.64 ng/mL    Comment:        Interpretation: PCT > 2 ng/mL: Systemic infection (sepsis) is likely, unless other causes are known. (NOTE)         ICU PCT Algorithm               Non ICU PCT Algorithm    ----------------------------     ------------------------------         PCT < 0.25 ng/mL                 PCT < 0.1 ng/mL     Stopping of antibiotics            Stopping of antibiotics       strongly encouraged.               strongly encouraged.    ----------------------------     ------------------------------       PCT level decrease by               PCT < 0.25 ng/mL       >= 80% from peak PCT       OR PCT 0.25 - 0.5 ng/mL          Stopping of antibiotics                                             encouraged.     Stopping of antibiotics           encouraged.    ----------------------------     ------------------------------       PCT level decrease by              PCT >= 0.25 ng/mL       < 80% from peak  PCT        AND PCT >= 0.5 ng/mL            Continuing antibiotics                                               encouraged.       Continuing antibiotics            encouraged.    ----------------------------     ------------------------------     PCT level increase compared          PCT > 0.5 ng/mL         with peak PCT AND          PCT >= 0.5 ng/mL             Escalation of antibiotics  strongly encouraged.      Escalation of antibiotics        strongly encouraged.   Troponin I     Status: None   Collection Time: 03/25/17  9:13 AM  Result Value Ref Range   Troponin I <0.03 <0.03 ng/mL  Comprehensive metabolic panel     Status: Abnormal   Collection Time: 03/25/17  9:13 AM  Result Value Ref Range   Sodium 135 135 - 145 mmol/L   Potassium 4.6 3.5 - 5.1 mmol/L   Chloride 104 101 - 111 mmol/L   CO2 21 (L) 22 - 32 mmol/L   Glucose, Bld 161 (H) 65 - 99 mg/dL   BUN 39 (H) 6 - 20 mg/dL   Creatinine, Ser 1.70 (H) 0.44 - 1.00 mg/dL   Calcium 8.0 (L) 8.9 - 10.3 mg/dL   Total Protein 7.0 6.5 - 8.1 g/dL   Albumin 2.3 (L) 3.5 - 5.0 g/dL   AST 23 15 - 41 U/L   ALT 18 14 - 54 U/L   Alkaline Phosphatase 105 38 - 126 U/L   Total Bilirubin 0.8 0.3 - 1.2 mg/dL   GFR calc non Af Amer 37 (L) >60 mL/min   GFR calc Af Amer 42 (L) >60 mL/min    Comment: (NOTE) The eGFR has been calculated using the CKD EPI equation. This calculation has not been validated in all clinical situations. eGFR's persistently <60 mL/min signify possible Chronic Kidney Disease.    Anion gap 10 5 - 15  Magnesium     Status: None   Collection Time: 03/25/17  9:13 AM  Result Value Ref Range   Magnesium 2.4 1.7 - 2.4 mg/dL  Phosphorus     Status: None   Collection Time: 03/25/17  9:13 AM  Result Value Ref Range   Phosphorus 3.7 2.5 - 4.6 mg/dL  Glucose, capillary     Status: Abnormal   Collection Time: 03/25/17 11:49 AM  Result Value Ref Range   Glucose-Capillary 134 (H) 65  - 99 mg/dL  Glucose, capillary     Status: Abnormal   Collection Time: 03/25/17  3:41 PM  Result Value Ref Range   Glucose-Capillary 326 (H) 65 - 99 mg/dL   Comment 1 Notify RN    Comment 2 Document in Chart   Troponin I     Status: Abnormal   Collection Time: 03/25/17  7:00 PM  Result Value Ref Range   Troponin I 0.07 (HH) <0.03 ng/mL    Comment: CRITICAL RESULT CALLED TO, READ BACK BY AND VERIFIED WITH: YACOPINO,J RN [id number] COVINGTON,N   Glucose, capillary     Status: Abnormal   Collection Time: 03/25/17  7:45 PM  Result Value Ref Range   Glucose-Capillary 294 (H) 65 - 99 mg/dL  Glucose, capillary     Status: Abnormal   Collection Time: 03/25/17 11:34 PM  Result Value Ref Range   Glucose-Capillary 310 (H) 65 - 99 mg/dL   Comment 1 Notify RN   Procalcitonin     Status: None   Collection Time: 03/26/17  3:20 AM  Result Value Ref Range   Procalcitonin 7.29 ng/mL    Comment:        Interpretation: PCT > 2 ng/mL: Systemic infection (sepsis) is likely, unless other causes are known. (NOTE)         ICU PCT Algorithm               Non ICU PCT Algorithm    ----------------------------     ------------------------------  PCT < 0.25 ng/mL                 PCT < 0.1 ng/mL     Stopping of antibiotics            Stopping of antibiotics       strongly encouraged.               strongly encouraged.    ----------------------------     ------------------------------       PCT level decrease by               PCT < 0.25 ng/mL       >= 80% from peak PCT       OR PCT 0.25 - 0.5 ng/mL          Stopping of antibiotics                                             encouraged.     Stopping of antibiotics           encouraged.    ----------------------------     ------------------------------       PCT level decrease by              PCT >= 0.25 ng/mL       < 80% from peak PCT        AND PCT >= 0.5 ng/mL            Continuing antibiotics                                                encouraged.       Continuing antibiotics            encouraged.    ----------------------------     ------------------------------     PCT level increase compared          PCT > 0.5 ng/mL         with peak PCT AND          PCT >= 0.5 ng/mL             Escalation of antibiotics                                          strongly encouraged.      Escalation of antibiotics        strongly encouraged.   CBC     Status: Abnormal   Collection Time: 03/26/17  3:20 AM  Result Value Ref Range   WBC 14.7 (H) 4.0 - 10.5 K/uL   RBC 3.24 (L) 3.87 - 5.11 MIL/uL   Hemoglobin 8.4 (L) 12.0 - 15.0 g/dL   HCT 26.4 (L) 36.0 - 46.0 %   MCV 81.5 78.0 - 100.0 fL   MCH 25.9 (L) 26.0 - 34.0 pg   MCHC 31.8 30.0 - 36.0 g/dL   RDW 15.0 11.5 - 15.5 %   Platelets 212 150 - 400 K/uL  Basic metabolic panel     Status: Abnormal   Collection Time: 03/26/17  3:20 AM  Result Value Ref Range   Sodium 137  135 - 145 mmol/L   Potassium 4.8 3.5 - 5.1 mmol/L   Chloride 106 101 - 111 mmol/L   CO2 21 (L) 22 - 32 mmol/L   Glucose, Bld 207 (H) 65 - 99 mg/dL   BUN 35 (H) 6 - 20 mg/dL   Creatinine, Ser 1.48 (H) 0.44 - 1.00 mg/dL   Calcium 8.4 (L) 8.9 - 10.3 mg/dL   GFR calc non Af Amer 43 (L) >60 mL/min   GFR calc Af Amer 50 (L) >60 mL/min    Comment: (NOTE) The eGFR has been calculated using the CKD EPI equation. This calculation has not been validated in all clinical situations. eGFR's persistently <60 mL/min signify possible Chronic Kidney Disease.    Anion gap 10 5 - 15  Glucose, capillary     Status: Abnormal   Collection Time: 03/26/17  3:29 AM  Result Value Ref Range   Glucose-Capillary 198 (H) 65 - 99 mg/dL   Comment 1 Notify RN   Glucose, capillary     Status: Abnormal   Collection Time: 03/26/17  7:41 AM  Result Value Ref Range   Glucose-Capillary 151 (H) 65 - 99 mg/dL   Comment 1 Notify RN    Comment 2 Document in Chart     Current Facility-Administered Medications  Medication Dose Route Frequency  Provider Last Rate Last Dose  . 0.9 %  sodium chloride infusion  1,000 mL Intravenous Continuous Barton Dubois, MD 75 mL/hr at 03/26/17 0316 1,000 mL at 03/26/17 0316  . acetaminophen (TYLENOL) tablet 650 mg  650 mg Oral Q6H PRN Toy Baker, MD       Or  . acetaminophen (TYLENOL) suppository 650 mg  650 mg Rectal Q6H PRN Doutova, Anastassia, MD      . acetaminophen (TYLENOL) tablet 1,000 mg  1,000 mg Oral Once Jeannett Senior, PA-C   Stopped at 03/24/17 1918  . amitriptyline (ELAVIL) tablet 50 mg  50 mg Oral QHS Barton Dubois, MD   50 mg at 03/25/17 2232  . atorvastatin (LIPITOR) tablet 20 mg  20 mg Oral q1800 Doutova, Anastassia, MD      . ceFEPIme (MAXIPIME) 2 g in dextrose 5 % 50 mL IVPB  2 g Intravenous Q8H Minda Ditto, RPH   Stopped at 03/26/17 0529  . Chlorhexidine Gluconate Cloth 2 % PADS 6 each  6 each Topical Q0600 Barton Dubois, MD   6 each at 03/26/17 0554  . doxycycline (VIBRAMYCIN) 100 mg in dextrose 5 % 250 mL IVPB  100 mg Intravenous Q12H Toy Baker, MD   Stopped at 03/26/17 1025  . HYDROcodone-acetaminophen (NORCO/VICODIN) 5-325 MG per tablet 1-2 tablet  1-2 tablet Oral Q4H PRN Toy Baker, MD   2 tablet at 03/26/17 0612  . insulin aspart (novoLOG) injection 0-9 Units  0-9 Units Subcutaneous Q4H Toy Baker, MD   2 Units at 03/26/17 0818  . insulin glargine (LANTUS) injection 40 Units  40 Units Subcutaneous QHS Toy Baker, MD   40 Units at 03/25/17 2231  . loratadine (CLARITIN) tablet 10 mg  10 mg Oral Daily PRN Barton Dubois, MD   10 mg at 03/26/17 0111  . metroNIDAZOLE (FLAGYL) IVPB 500 mg  500 mg Intravenous Q8H Toy Baker, MD   Stopped at 03/26/17 0601  . morphine 4 MG/ML injection 2 mg  2 mg Intravenous Q6H PRN Barton Dubois, MD   2 mg at 03/26/17 0858  . mupirocin ointment (BACTROBAN) 2 % 1 application  1 application Nasal BID Barton Dubois, MD  1 application at 81/15/72 0858  . ondansetron (ZOFRAN) tablet 4 mg   4 mg Oral Q6H PRN Toy Baker, MD       Or  . ondansetron (ZOFRAN) injection 4 mg  4 mg Intravenous Q6H PRN Doutova, Anastassia, MD      . prednisoLONE acetate (PRED FORTE) 1 % ophthalmic suspension 1 drop  1 drop Right Eye QID Barton Dubois, MD   1 drop at 03/26/17 0901  . pregabalin (LYRICA) capsule 100 mg  100 mg Oral BID Barton Dubois, MD   100 mg at 03/26/17 6203    Musculoskeletal: Strength & Muscle Tone: within normal limits Gait & Station: UTA since patient was sitting upright in a chair. She requires assistance to ambulate due to left foot and leg pain secondary to an infection. Patient leans: N/A  Psychiatric Specialty Exam: Physical Exam  Nursing note and vitals reviewed. Constitutional: She is oriented to person, place, and time. She appears well-developed and well-nourished.  HENT:  Head: Normocephalic and atraumatic.  Neck: Normal range of motion.  Respiratory: Effort normal.  Musculoskeletal: Normal range of motion.  Neurological: She is alert and oriented to person, place, and time.  Skin: No rash noted.  Psychiatric: Her speech is normal and behavior is normal. Judgment and thought content normal. Her mood appears not anxious. Cognition and memory are normal. She exhibits a depressed mood.    Review of Systems  Constitutional: Positive for chills and fever.  HENT: Negative for hearing loss.   Eyes: Positive for blurred vision (right eye).  Gastrointestinal: Negative for constipation, diarrhea, nausea and vomiting.  Genitourinary: Negative for dysuria.  Musculoskeletal:       Left foot and leg pain secondary to cellulitis.   Psychiatric/Behavioral: Positive for depression. Negative for hallucinations, memory loss, substance abuse and suicidal ideas. The patient has insomnia. The patient is not nervous/anxious.     Blood pressure 95/70, pulse (!) 106, temperature 97.6 F (36.4 C), temperature source Oral, resp. rate (!) 28, height _0  (1.778 m), weight  (!) 157.8 kg (347 lb 14.2 oz), last menstrual period 02/22/2017, SpO2 91 %.Body mass index is 49.92 kg/m.  General Appearance: Well Groomed, obese, African American female with short afro hair who is wearing a hospital gown and sitting upright in a chair. She appears to have some discomfort due to left lower extremity cellulitis.   Eye Contact:  Good  Speech:  Normal Rate  Volume:  Normal  Mood:  Depressed  Affect:  Full Range and intermittently appears dysphoric but secondary to pain.  Thought Process:  Coherent, Goal Directed and Linear  Orientation:  Full (Time, Place, and Person)  Thought Content:  Logical  Suicidal Thoughts:  No  Homicidal Thoughts:  No  Memory:  Immediate;   Good Recent;   Good Remote;   Good  Judgement:  Fair  Insight:  Good  Psychomotor Activity:  Normal  Concentration:  Concentration: Good and Attention Span: Good  Recall:  Good  Fund of Knowledge:  Good  Language:  Good  Akathisia:  No  Handed:  Right  AIMS (if indicated):   N/A  Assets:  Communication Skills Housing Social Support  ADL's:  Intact  Cognition:  WNL  Sleep:   Poor   Assessment:  [name] [name] was admitted with sepsis secondary to left lower extremity cellulitis. Psychiatry was consulted for management of depression. She reports depressed mood since hospitalization due to her current condition. She reports a stable mood prior to this. She  denies SI or depressive symptoms except poor sleep (secondary to OSA) and poor energy (attributed to chronic pain). She should continue to follow up with her PCP for medication management. She does not warrant inpatient psychiatric hospitalization.   Treatment Plan Summary: -Please have unit SW provide patient with therapy resources in the setting of stressors related to medical conditions.  -Encourage patient to follow up with pulmonology to adjust her CPAP and/or find a fit that will be comfortable to increase compliance to improve sleep.   -Continue Elavil 50 mg qhs for depression and pain and continue regular follow up with PCP who manages this medication.   Disposition: No evidence of imminent risk to self or others at present.   Patient does not meet criteria for psychiatric inpatient admission.  Faythe Dingwall, DO 03/26/2017 1:01 PM

## 2017-03-26 NOTE — Progress Notes (Signed)
RT placed patient on CPAP. Sterile water added to water chamber for humidification. Patient setting is 8 cmH2O. Patient is tolerating well

## 2017-03-26 NOTE — Progress Notes (Signed)
[facility] PROGRESS NOTE  [name] [name] LXB:[id number] DOB: Feb 13, 1976 DOA: 03/24/2017 PCP: Colbert Ewing, MD  Interim summary and HPI 41 y.o. female with medical history significant of necrotizing fasciitis of the groin, diabetic neuropathy,  T2DM, HTN, OSA on CPAP, CKD stage 2-3and Obesity chronic back pain and bipolar disorder; admitted secondary to severe sepsis secondary to left lower extremity cellulitis.  Assessment/Plan: 1-severe sepsis: Appears to be secondary to left lower extremity cellulitis Patient met sepsis criteria with active hypertension at the moment of admission (which responded appropriately to fluid resuscitation and stopping home antihypertensive regimens) -Will continue IV fluids, IV antibiotics and supportive care -orthopedic service is aware of admission and recommended IV antibiotics for now. Base on MRI will determine further interventions. -Follow clinical response and adjust regimen according to cultures results. -will follow lactic acid and procalcitonin trend. -given still elevation on procalcitonin and worsening pain in her foot/leg, will check MRI.  2-acute on chronic renal failure: Stage II-III at baseline. -Continue holding nephrotoxic agents.   -Continue IV fluids -Follow renal function trend. -Renal function improving and creatinine level trending down. -No signs of infection in her urine.  3-diabetes with renal manifestation and neuropathy -Continue sliding scale insulin -Continue Lyrica -Modify carbohydrates diet ordered.  4-depression -Will continue Elavil -Psychiatry has been consulted and will follow further recommendations.  5-essential hypertension -Continue holding antihypertensive regimen for now -Follow vital signs and slowly resume BP meds as needed. -Blood pressure remains soft but stable.  6-obstructive sleep apnea -Continue CPAP QHS -Patient reports no compliance at home secondary to no proper fitting of her  CPAP mask.  7-morbid obesity -Body mass index is 49.92 kg/m.  -Low calorie diet and weight loss has been discussed with patient  8-hyperlipidemia. -continue statins.  9-chronic diastolic heart failure -Compensated and at this moment -Follow heart healthy diet -Continue Daily weights and strict input and output.  Code Status: Full code Family Communication: No family at bedside.  Patient requested not to contact family member for updates. Disposition Plan: will keep her in stepdown unit.  Continue current IV antibiotics and supportive care.  Psychiatry found patient in no need for inpatient services and recommended outpatient follow-up and continuation of Elavil.    Consultants:  PCCM  Psychiatry service  Procedures:  See below for x-ray reports.  Antibiotics:  Cefepime, Flagyl and doxycycline 11/2  HPI/Subjective: Afebrile, no chest pain, no shortness of breath, no nausea, no vomiting.  Patient complaining of significant left lower extremity pain and reported difficulty sleeping.  Objective: Vitals:   03/26/17 1100 03/26/17 1200  BP: 95/70   Pulse:    Resp: (!) 28   Temp:  98.4 F (36.9 C)  SpO2:      Intake/Output Summary (Last 24 hours) at 03/26/2017 1548 Last data filed at 03/26/2017 1435 Gross per 24 hour  Intake 756.25 ml  Output 2550 ml  Net -1793.75 ml   Filed Weights   03/24/17 1932 03/25/17 0400 03/26/17 0350  Weight: (!) 149.7 kg (330 lb) (!) 157 kg (346 lb 2 oz) (!) 157.8 kg (347 lb 14.2 oz)    Exam:   General: No fever, no chest pain, no abdominal pain.  Patient reports breathing is a stable.  Complaining of significant pain in her left lower extremity and also expressed difficulty sleeping.    Cardiovascular: S1 and S2, no rubs, no gallops, no JVD (even unable to properly assess secondary to body habitus).    Respiratory: Normal respiratory effort, no wheezing, no crackles, good  oxygen saturation on room air.  Abdomen: Obese, soft,  nontender, nondistended, positive bowel sounds.    Musculoskeletal: Left lower extremity with swelling, erythema and tenderness to palpation (extending from her foot all the way up to below her knee; with a particular small area of concern in the first webspace and plantar aspect of her foot).  No drainage appreciated.  The rest of her limbs are within normal limits and without any signs of infection.  No cyanosis seen.    Data Reviewed: Basic Metabolic Panel: Recent Labs  Lab 03/24/17 1904 03/24/17 1909 03/25/17 0913 03/26/17 0320  NA 132* 132* 135 137  K 4.8 4.7 4.6 4.8  CL 96* 97* 104 106  CO2 26  --  21* 21*  GLUCOSE 221* 222* 161* 207*  BUN 40* 39* 39* 35*  CREATININE 1.85* 1.90* 1.70* 1.48*  CALCIUM 8.6*  --  8.0* 8.4*  MG  --   --  2.4  --   PHOS  --   --  3.7  --    Liver Function Tests: Recent Labs  Lab 03/24/17 1904 03/25/17 0913  AST 19 23  ALT 17 18  ALKPHOS 118 105  BILITOT 0.8 0.8  PROT 7.8 7.0  ALBUMIN 2.7* 2.3*   CBC: Recent Labs  Lab 03/24/17 1904 03/24/17 1909 03/25/17 0445 03/26/17 0320  WBC 19.5*  --  14.5* 14.7*  NEUTROABS 16.1*  --   --   --   HGB 9.6* 10.2* 8.5* 8.4*  HCT 29.4* 30.0* 25.4* 26.4*  MCV 80.8  --  82.2 81.5  PLT 236  --  153 212   Cardiac Enzymes: Recent Labs  Lab 03/24/17 2324 03/25/17 0913 03/25/17 1900  CKTOTAL 85  --   --   TROPONINI <0.03 <0.03 0.07*   CBG: Recent Labs  Lab 03/25/17 1945 03/25/17 2334 03/26/17 0329 03/26/17 0741 03/26/17 1255  GLUCAP 294* 310* 198* 151* 205*    Recent Results (from the past 240 hour(s))  Blood Culture (routine x 2)     Status: None (Preliminary result)   Collection Time: 03/24/17  6:28 PM  Result Value Ref Range Status   Specimen Description BLOOD RIGHT ANTECUBITAL  Final   Special Requests IN PEDIATRIC BOTTLE Blood Culture adequate volume  Final   Culture   Final    NO GROWTH 2 DAYS Performed at [facility], [place] [address], [place], [place] [address]     Report Status PENDING  Incomplete  Blood Culture (routine x 2)     Status: None (Preliminary result)   Collection Time: 03/24/17  7:04 PM  Result Value Ref Range Status   Specimen Description BLOOD LEFT ANTECUBITAL  Final   Special Requests   Final    BOTTLES DRAWN AEROBIC AND ANAEROBIC Blood Culture results may not be optimal due to an inadequate volume of blood received in culture bottles   Culture   Final    NO GROWTH 2 DAYS Performed at [facility], [place] [address] [place], [place] [address]    Report Status PENDING  Incomplete  MRSA PCR Screening     Status: Abnormal   Collection Time: 03/25/17  3:53 AM  Result Value Ref Range Status   MRSA by PCR POSITIVE (A) NEGATIVE Final    Comment:        The GeneXpert MRSA Assay (FDA approved for NASAL specimens only), is one component of a comprehensive MRSA colonization surveillance program. It is not intended to diagnose MRSA  infection nor to guide or monitor treatment for MRSA infections. RESULT CALLED TO, READ BACK BY AND VERIFIED WITH: FRANKLIN,S AT 1055 ON [id number] BY HOOKER,B      Studies: Ct Foot Left Wo Contrast  Result Date: 03/24/2017 CLINICAL DATA:  Bilateral lower extremity swelling x2 days. EXAM: CT OF THE LEFT FOOT WITHOUT CONTRAST TECHNIQUE: Multidetector CT imaging of the left foot was performed according to the standard protocol. Multiplanar CT image reconstructions were also generated. COMPARISON:  II 18 foot radiographs. FINDINGS: Bones/Joint/Cartilage Degenerative subchondral cysts of the medial, middle and lateral cuneiform secondary to osteoarthritis across navicular-cuneiform as well as the second and third TMT joints. No fracture or suspicious osseous lesions. Dorsal calcaneal enthesopathy. No joint dislocations. Ligaments Suboptimally assessed by CT. Muscles and Tendons No significant muscle atrophy. No intramuscular hemorrhage. The tendons crossing the ankle joint demonstrate no evidence of tenosynovitis  or tendinopathy. Soft tissues Diffuse skin thickening and subcutaneous soft tissue induration consistent cellulitis and/or third spacing. Numerous calcified phleboliths are seen within the subcutaneous soft tissues about the ankle and foot. IMPRESSION: 1. Diffuse skin thickening and subcutaneous soft tissue edema consistent with cellulitis and/or third spacing of fluid possibly from chronic venous insufficiency. 2. Midfoot osteoarthritis with subcortical and subchondral cystic change of the cuneiforms. No acute fracture or bone destruction. 3. Prominent dorsal calcaneal enthesophyte. Electronically Signed   By: Ashley Royalty M.D.   On: 03/24/2017 22:37   Dg Chest Portable 1 View  Result Date: 03/24/2017 CLINICAL DATA:  41 year old female with sepsis. EXAM: PORTABLE CHEST 1 VIEW COMPARISON:  Chest radiograph dated 07/27/2012 FINDINGS: There is borderline cardiomegaly. Mild prominence of the central vasculature likely representing mild vascular congestion. There is no focal consolidation, pleural effusion, or pneumothorax. No acute osseous pathology. IMPRESSION: Borderline cardiomegaly with probable mild vascular congestion. No focal consolidation. Electronically Signed   By: Anner Crete M.D.   On: 03/24/2017 19:07   Dg Foot Complete Left  Result Date: 03/24/2017 CLINICAL DATA:  Swelling in both lower extremities with chest pain and shortness of breath. EXAM: LEFT FOOT - COMPLETE 3+ VIEW COMPARISON:  None. FINDINGS: Cutaneous calcifications along the lower calf probably from venous insufficiency. Soft tissue swelling in the lower calf and foot. Dorsal spurring in the midfoot. Normal alignment at the Lisfranc joint. No acute bony findings. IMPRESSION: 1. Diffuse soft tissue swelling in the foot and lower calf. Calcifications in the cutaneous tissues the lower calf are probably from venous insufficiency. 2. Dorsal midfoot spurring. Electronically Signed   By: Van Clines M.D.   On: 03/24/2017 19:32     Scheduled Meds: . acetaminophen  1,000 mg Oral Once  . amitriptyline  50 mg Oral QHS  . atorvastatin  20 mg Oral q1800  . Chlorhexidine Gluconate Cloth  6 each Topical Q0600  . insulin aspart  0-9 Units Subcutaneous Q4H  . insulin glargine  40 Units Subcutaneous QHS  . mupirocin ointment  1 application Nasal BID  . prednisoLONE acetate  1 drop Right Eye QID  . pregabalin  100 mg Oral BID   Continuous Infusions: . sodium chloride 1,000 mL (03/26/17 0316)  . ceFEPime (MAXIPIME) IV Stopped (03/26/17 1349)  . doxycycline (VIBRAMYCIN) IV Stopped (03/26/17 1025)  . metronidazole Stopped (03/26/17 1538)    Principal Problem:   Adjustment disorder with depressed mood Active Problems:   Hyperlipidemia   Essential hypertension   Diabetic peripheral neuropathy (HCC)   Uncontrolled type 2 diabetes mellitus (HCC)   OSA on CPAP   CKD (chronic  kidney disease) stage 2, GFR 60-89 ml/min   Severe sepsis (HCC)   Hyponatremia   Abnormal ECG   Sepsis ([place])    Time spent: 30 minutes    Barton Dubois  [facility] Pager [phone]. If 7PM-7AM, please contact night-coverage at [email or web address], password [facility] 03/26/2017, 3:48 PM  LOS: 2 days

## 2017-03-27 ENCOUNTER — Ambulatory Visit (HOSPITAL_COMMUNITY)
Admit: 2017-03-27 | Discharge: 2017-03-27 | Disposition: A | Discharge status: Home / Self Care | Payer: Medicare Other | Attending: Internal Medicine | Admitting: Internal Medicine

## 2017-03-27 ENCOUNTER — Inpatient Hospital Stay (HOSPITAL_COMMUNITY): Payer: Medicare Other

## 2017-03-27 ENCOUNTER — Inpatient Hospital Stay (HOSPITAL_COMMUNITY): Payer: Medicare Other | Admitting: Anesthesiology

## 2017-03-27 ENCOUNTER — Encounter (HOSPITAL_COMMUNITY): Payer: Self-pay | Admitting: Registered Nurse

## 2017-03-27 DIAGNOSIS — R600 Localized edema: Secondary | ICD-10-CM | POA: Diagnosis not present

## 2017-03-27 LAB — CBC
HCT: 32.1 % — ABNORMAL LOW (ref 36.0–46.0)
Hemoglobin: 10.6 g/dL — ABNORMAL LOW (ref 12.0–15.0)
MCH: 26.4 pg (ref 26.0–34.0)
MCHC: 33 g/dL (ref 30.0–36.0)
MCV: 80 fL (ref 78.0–100.0)
PLATELETS: 220 10*3/uL (ref 150–400)
RBC: 4.01 MIL/uL (ref 3.87–5.11)
RDW: 15 % (ref 11.5–15.5)
WBC: 12.2 10*3/uL — AB (ref 4.0–10.5)

## 2017-03-27 LAB — BASIC METABOLIC PANEL
ANION GAP: 10 (ref 5–15)
BUN: 25 mg/dL — ABNORMAL HIGH (ref 6–20)
CALCIUM: 9.1 mg/dL (ref 8.9–10.3)
CO2: 20 mmol/L — ABNORMAL LOW (ref 22–32)
Chloride: 106 mmol/L (ref 101–111)
Creatinine, Ser: 1.15 mg/dL — ABNORMAL HIGH (ref 0.44–1.00)
GFR, EST NON AFRICAN AMERICAN: 59 mL/min — AB (ref >60)
Glucose, Bld: 139 mg/dL — ABNORMAL HIGH (ref 65–99)
POTASSIUM: 4.9 mmol/L (ref 3.5–5.1)
SODIUM: 136 mmol/L (ref 135–145)

## 2017-03-27 LAB — GLUCOSE, CAPILLARY
GLUCOSE-CAPILLARY: 143 mg/dL — AB (ref 65–99)
GLUCOSE-CAPILLARY: 151 mg/dL — AB (ref 65–99)
GLUCOSE-CAPILLARY: 207 mg/dL — AB (ref 65–99)
Glucose-Capillary: 134 mg/dL — ABNORMAL HIGH (ref 65–99)
Glucose-Capillary: 131 mg/dL — ABNORMAL HIGH (ref 65–99)

## 2017-03-27 LAB — URINALYSIS, ROUTINE W REFLEX MICROSCOPIC
BILIRUBIN URINE: NEGATIVE
HGB URINE DIPSTICK: NEGATIVE
Ketones, ur: 5 mg/dL — AB
LEUKOCYTES UA: NEGATIVE
NITRITE: NEGATIVE
PH: 5 (ref 5.0–8.0)
Protein, ur: 30 mg/dL — AB
SPECIFIC GRAVITY, URINE: 1.016 (ref 1.005–1.030)

## 2017-03-27 LAB — LACTIC ACID, PLASMA: LACTIC ACID, VENOUS: 1.3 mmol/L (ref 0.5–1.9)

## 2017-03-27 MED ORDER — MIDAZOLAM HCL 2 MG/2ML IJ SOLN
INTRAMUSCULAR | Status: AC
  Filled 2017-03-27: qty 2

## 2017-03-27 MED ORDER — FENTANYL CITRATE (PF) 100 MCG/2ML IJ SOLN
INTRAMUSCULAR | Status: AC
  Filled 2017-03-27: qty 2

## 2017-03-27 MED ORDER — GADOBENATE DIMEGLUMINE 529 MG/ML IV SOLN
20.0000 mL | Freq: Once | INTRAVENOUS | Status: AC
  Administered 2017-03-27: 20 mL via INTRAVENOUS

## 2017-03-27 MED ORDER — HYDRALAZINE HCL 20 MG/ML IJ SOLN: 10.0000 mg | Freq: Three times a day (TID) | INTRAMUSCULAR | Status: DC | PRN

## 2017-03-27 MED ORDER — PROPOFOL 10 MG/ML IV BOLUS
INTRAVENOUS | Status: AC
  Filled 2017-03-27: qty 20

## 2017-03-27 MED ORDER — MORPHINE SULFATE (PF) 4 MG/ML IV SOLN
2.0000 mg | Freq: Once | INTRAVENOUS | Status: AC
  Administered 2017-03-27: 2 mg via INTRAVENOUS

## 2017-03-27 MED ORDER — DEXAMETHASONE SODIUM PHOSPHATE 10 MG/ML IJ SOLN
INTRAMUSCULAR | Status: AC
  Filled 2017-03-27: qty 1

## 2017-03-27 MED ORDER — ONDANSETRON HCL 4 MG/2ML IJ SOLN
INTRAMUSCULAR | Status: AC
  Filled 2017-03-27: qty 2

## 2017-03-27 MED ORDER — LIDOCAINE 2% (20 MG/ML) 5 ML SYRINGE
INTRAMUSCULAR | Status: AC
  Filled 2017-03-27: qty 5

## 2017-03-27 NOTE — Progress Notes (Signed)
Patient was unable to get MRI at [facility] due to her size. She is currently at [facility]. Was planning to perform I&D pending MRI results. Will postpone her surgery until tomorrow afternoon, tentatively. Will review images tonight and discuss plan with patient in the AM. NPO past midnight.  Shona Needles, MD [facility] [phone] (phone)

## 2017-03-27 NOTE — Progress Notes (Signed)
[facility]edic [facility] Progress Note  S: States that the pain in her foot and leg are the same and maybe even worse.  O:  Vitals:   03/27/17 0200 03/27/17 0300  BP: 109/66   Pulse: 91   Resp: 17   Temp:  98 F (36.7 C)  SpO2: 98%    LLE: Area of blistering and fluctuance in 1st webspace and over second toe, midfoot and proximal with mild erythema.  Imaging: No MRI yet  Labs: WBC down to 12.2 Lactic acid 53.20  A/P: 41 year old female with diabetes and peripheral neuropathy with left lower extremity cellulitis and what appears to be forefoot abscess/collection  -NPO for now -MRI today -Potential OR for I&D depending on MRI results  Shona Needles, MD [facility] [phone] (phone)

## 2017-03-27 NOTE — Progress Notes (Signed)
[facility] PROGRESS NOTE  PAULENE TAYAG FUX:[id number] DOB: 02-10-1976 DOA: 03/24/2017 PCP: Colbert Ewing, MD  Interim summary and HPI 41 y.o. female with medical history significant of necrotizing fasciitis of the groin, diabetic neuropathy,  T2DM, HTN, OSA on CPAP, CKD stage 2-3and Obesity chronic back pain and bipolar disorder; admitted secondary to severe sepsis secondary to left lower extremity cellulitis.  Assessment/Plan: 1-severe sepsis: Appears to be secondary to left lower extremity cellulitis Patient met sepsis criteria with active hypertension at the moment of admission (which responded appropriately to fluid resuscitation and stopping home antihypertensive regimens) -Will continue IV fluids, IV antibiotics and supportive care -orthopedic service is aware of admission and recommended stat MRI and keeping patient NPO for OR visit for I&D base on images results.  -Follow clinical response and adjust regimen according to cultures results. -WBC's are trending down, but patient still with significant pain, swelling of her foot and elevation of procalcitonin.  2-acute on chronic renal failure: Stage II-III at baseline. -Continue holding nephrotoxic agents.   -Continue IV fluids -Follow renal function trend. -Renal function improving and creatinine level trending down and close to baseline. -No signs of infection in her urine.  3-diabetes with renal manifestation and neuropathy -Continue sliding scale insulin -Continue Lyrica -Modify carbohydrates diet ordered; now NPO in anticipation of surgery. Follow CBG's.  4-depression -Will continue Elavil -Psychiatry has been consulted and recommended to continue home regimen and outpatient follow up.  5-essential hypertension -BP elevated. Will use PRN hydralazine  -follow VS  6-obstructive sleep apnea -Continue CPAP QHS -Patient reports no compliance at home secondary to no proper fitting of her CPAP  mask.  7-morbid obesity -Body mass index is 49.92 kg/m.  -Low calorie diet and weight loss has been discussed with patient  8-hyperlipidemia. -continue statins.  9-chronic diastolic heart failure -Compensated and at this moment -Follow heart healthy diet -Continue Daily weights and strict input and output.  Code Status: Full code Family Communication: uncle at bedside  Disposition Plan: will keep her in stepdown unit.  Continue current IV antibiotics and supportive care. Follow MRI and orthopedic service recommendations.  Consultants:  PCCM  Psychiatry service  Procedures:  See below for x-ray reports.  Antibiotics:  Cefepime, Flagyl and doxycycline 11/2  HPI/Subjective: No fever; no CP, no SOB, no nausea, no vomiting. Complaining of excruciating pain in her left foot and leg.  Objective: Vitals:   03/27/17 0800 03/27/17 0819  BP:  (!) 164/96  Pulse: (!) 123 (!) 123  Resp: (!) 21 17  Temp: 98.7 F (37.1 C)   SpO2: 98% 95%    Intake/Output Summary (Last 24 hours) at 03/27/2017 0830 Last data filed at 03/27/2017 0800 Gross per 24 hour  Intake 2200 ml  Output 2000 ml  Net 200 ml   Filed Weights   03/24/17 1932 03/25/17 0400 03/26/17 0350  Weight: (!) 149.7 kg (330 lb) (!) 157 kg (346 lb 2 oz) (!) 157.8 kg (347 lb 14.2 oz)    Exam:   General: afebrile, no CP, no SOB, no nausea, no vomiting. Continue having excruciating pain in her left leg and left foot.  Cardiovascular: rate controlled, no rubs, no gallops  Respiratory: normal resp effort, no wheezing, no crackles  Abdomen: obese, soft, NT, ND, positive BS.    Musculoskeletal: LLE with redness, swelling and pain, foot is even more swollen today and per patient pain is intolerable.   Data Reviewed: Basic Metabolic Panel: Recent Labs  Lab 03/24/17 1904 03/24/17 1909 03/25/17 0254  03/26/17 0320 03/27/17 0435  NA 132* 132* 135 137 136  K 4.8 4.7 4.6 4.8 4.9  CL 96* 97* 104 106 106  CO2 26  --   21* 21* 20*  GLUCOSE 221* 222* 161* 207* 139*  BUN 40* 39* 39* 35* 25*  CREATININE 1.85* 1.90* 1.70* 1.48* 1.15*  CALCIUM 8.6*  --  8.0* 8.4* 9.1  MG  --   --  2.4  --   --   PHOS  --   --  3.7  --   --    Liver Function Tests: Recent Labs  Lab 03/24/17 1904 03/25/17 0913  AST 19 23  ALT 17 18  ALKPHOS 118 105  BILITOT 0.8 0.8  PROT 7.8 7.0  ALBUMIN 2.7* 2.3*   CBC: Recent Labs  Lab 03/24/17 1904 03/24/17 1909 03/25/17 0445 03/26/17 0320 03/27/17 0435  WBC 19.5*  --  14.5* 14.7* 12.2*  NEUTROABS 16.1*  --   --   --   --   HGB 9.6* 10.2* 8.5* 8.4* 10.6*  HCT 29.4* 30.0* 25.4* 26.4* 32.1*  MCV 80.8  --  82.2 81.5 80.0  PLT 236  --  153 212 220   Cardiac Enzymes: Recent Labs  Lab 03/24/17 2324 03/25/17 0913 03/25/17 1900  CKTOTAL 85  --   --   TROPONINI <0.03 <0.03 0.07*   CBG: Recent Labs  Lab 03/26/17 1547 03/26/17 2050 03/26/17 2310 03/27/17 0320 03/27/17 0729  GLUCAP 230* 220* 221* 143* 151*    Recent Results (from the past 240 hour(s))  Blood Culture (routine x 2)     Status: None (Preliminary result)   Collection Time: 03/24/17  6:28 PM  Result Value Ref Range Status   Specimen Description BLOOD RIGHT ANTECUBITAL  Final   Special Requests IN PEDIATRIC BOTTLE Blood Culture adequate volume  Final   Culture   Final    NO GROWTH 2 DAYS Performed at [facility], [place] [address], [place], [place] [address]    Report Status PENDING  Incomplete  Blood Culture (routine x 2)     Status: None (Preliminary result)   Collection Time: 03/24/17  7:04 PM  Result Value Ref Range Status   Specimen Description BLOOD LEFT ANTECUBITAL  Final   Special Requests   Final    BOTTLES DRAWN AEROBIC AND ANAEROBIC Blood Culture results may not be optimal due to an inadequate volume of blood received in culture bottles   Culture   Final    NO GROWTH 2 DAYS Performed at [facility], [place] [address] [place], [place] [address]    Report Status PENDING   Incomplete  MRSA PCR Screening     Status: Abnormal   Collection Time: 03/25/17  3:53 AM  Result Value Ref Range Status   MRSA by PCR POSITIVE (A) NEGATIVE Final    Comment:        The GeneXpert MRSA Assay (FDA approved for NASAL specimens only), is one component of a comprehensive MRSA colonization surveillance program. It is not intended to diagnose MRSA infection nor to guide or monitor treatment for MRSA infections. RESULT CALLED TO, READ BACK BY AND VERIFIED WITH: FRANKLIN,S AT 1055 ON [id number] BY HOOKER,B      Studies: No results found.  Scheduled Meds: . acetaminophen  1,000 mg Oral Once  . amitriptyline  50 mg Oral QHS  . atorvastatin  20 mg Oral q1800  . Chlorhexidine Gluconate Cloth  6 each Topical Q0600  .  insulin aspart  0-9 Units Subcutaneous Q4H  . insulin glargine  40 Units Subcutaneous QHS  .  morphine injection  2 mg Intravenous Once  . mupirocin ointment  1 application Nasal BID  . prednisoLONE acetate  1 drop Right Eye QID  . pregabalin  100 mg Oral BID   Continuous Infusions: . sodium chloride 1,000 mL (03/27/17 0113)  . ceFEPime (MAXIPIME) IV Stopped (03/27/17 0458)  . doxycycline (VIBRAMYCIN) IV 100 mg (03/27/17 0803)  . metronidazole Stopped (03/27/17 0601)    Principal Problem:   Adjustment disorder with depressed mood Active Problems:   Hyperlipidemia   Essential hypertension   Diabetic peripheral neuropathy (HCC)   Uncontrolled type 2 diabetes mellitus (HCC)   OSA on CPAP   CKD (chronic kidney disease) stage 2, GFR 60-89 ml/min   Severe sepsis (HCC)   Hyponatremia   Abnormal ECG   Sepsis ([place])    Time spent: 30 minutes    Barton Dubois  [facility] Pager [phone]. If 7PM-7AM, please contact night-coverage at [email or web address], password [facility] 03/27/2017, 8:30 AM  LOS: 3 days

## 2017-03-27 NOTE — Progress Notes (Signed)
Pharmacy Antibiotic Note  [name] [name] is a 41 y.o. female admitted on 03/24/2017 with DM foot infection.  Pharmacy has been consulted for Cefepime dosing.  Cellulitis order set suggests Cefepime/Flagyl po/Vancomycin - hx of renal dysfx with Vancomycin, use Doxycyline.  Plan:  Continue Cefepime 2gm q8hr; pharmacy will sign off as patient appears stable and renal adjustment unlikely   MD dosing (appropriate)  Doxycycline 100mg  IV q12 per MD  Flagyl 500mg  po q8  Height: 5\' 10"  (177.8 cm) Weight: (!) 350 lb 5 oz (158.9 kg) IBW/kg (Calculated) : 68.5  Temp (24hrs), Avg:98.7 F (37.1 C), Min:98 F (36.7 C), Max:99.6 F (37.6 C)  Recent Labs  Lab 03/24/17 1904 03/24/17 1908 03/24/17 1909 03/24/17 2030 03/24/17 2324 03/25/17 0416 03/25/17 0445 03/25/17 0913 03/26/17 0320 03/27/17 0435  WBC 19.5*  --   --   --   --   --  14.5*  --  14.7* 12.2*  CREATININE 1.85*  --  1.90*  --   --   --   --  1.70* 1.48* 1.15*  LATICACIDVEN  --  1.38  --  2.21* 0.8 1.1  --   --   --  1.3    Estimated Creatinine Clearance: 107.5 mL/min (A) (by C-G formula based on SCr of 1.15 mg/dL (H)).    Allergies  Allergen Reactions  . Vancomycin Other (See Comments)    Acute renal failure suspected secondary to vanco   Antimicrobials this admission: 11/2 Cefepime >>  11/2 Doxycycline >>  11/2 Metronidazole po >>    Microbiology results: 11/2 BCx: NGTD 11/3 MRSA PCR: positive Thank you for allowing pharmacy to be a part of this patient's care.  Reuel Boom, PharmD, BCPS Pager: [phone] 03/27/2017, 4:53 PM

## 2017-03-27 NOTE — Anesthesia Preprocedure Evaluation (Deleted)
Anesthesia Evaluation  Patient identified by MRN, date of birth, ID band Patient awake    Reviewed: Allergy & Precautions, NPO status , Patient's Chart, lab work & pertinent test results  Airway Mallampati: II  TM Distance: >3 FB Neck ROM: Full    Dental  (+) Teeth Intact, Dental Advisory Given   Pulmonary sleep apnea and Continuous Positive Airway Pressure Ventilation , Current Smoker,    Pulmonary exam normal breath sounds clear to auscultation       Cardiovascular hypertension, Pt. on medications Normal cardiovascular exam Rhythm:Regular Rate:Normal  ECHO 01/25/17 >The cavity size was normal. Wall thickness wasincreased in a pattern of mild LVH. Systolic function was normal.The estimated ejection fraction was in the range of 60% to 65%.Although no diagnostic regional wall motion abnormality wasidentified, this possibility cannot be completely excluded on the basis of this study. Features are consistent with a pseudonormalleft ventricular filling pattern, with concomitant abnormalrelaxation and increased filling pressure (grade 2 diastolicdysfunction).      Neuro/Psych PSYCHIATRIC DISORDERS Anxiety Depression Bipolar Disorder  Neuromuscular disease    GI/Hepatic Neg liver ROS, GERD  ,  Endo/Other  diabetes, Type 2, Insulin Dependent, Oral Hypoglycemic AgentsHypothyroidism Morbid obesity  Renal/GU Renal InsufficiencyRenal disease     Musculoskeletal left lower extremity cellulitis   Abdominal   Peds  Hematology  (+) Blood dyscrasia, anemia ,   Anesthesia Other Findings Day of surgery medications reviewed with the patient.  Reproductive/Obstetrics                             Anesthesia Physical Anesthesia Plan  ASA: III  Anesthesia Plan:    Post-op Pain Management:    Induction: Intravenous  PONV Risk Score and Plan: 2 and Ondansetron and Midazolam  Airway Management Planned:  Oral ETT  Additional Equipment:   Intra-op Plan:   Post-operative Plan: Extubation in OR  Informed Consent: I have reviewed the patients History and Physical, chart, labs and discussed the procedure including the risks, benefits and alternatives for the proposed anesthesia with the patient or authorized representative who has indicated his/her understanding and acceptance.   Dental advisory given  Plan Discussed with: CRNA  Anesthesia Plan Comments: (Risks/benefits of general anesthesia discussed with patient including risk of damage to teeth, lips, gum, and tongue, nausea/vomiting, allergic reactions to medications, and the possibility of heart attack, stroke and death.  All patient questions answered.  Patient wishes to proceed.)        Anesthesia Quick Evaluation

## 2017-03-27 NOTE — Progress Notes (Signed)
Patient ID: [name] [name], female   DOB: [birth year], 41 y.o.   MRN: [id number]  Internal Medicine Clinic Attending  I saw and evaluated the patient.  I personally confirmed the key portions of the history and exam documented by Dr. Aggie Hacker and I reviewed pertinent patient test results.  The assessment, diagnosis, and plan were formulated together and I agree with the documentation in the resident's note.

## 2017-03-27 NOTE — Progress Notes (Signed)
Date:  March 27, 2017 Chart reviewed for concurrent status and case management needs.  Will continue to follow patient progress.  Discharge Planning: following for needs  Expected discharge date: March 30, 2017  Shelsie Tijerino, BSN, RN3, CCM   [phone]  

## 2017-03-28 ENCOUNTER — Encounter (HOSPITAL_COMMUNITY)
Admission: EM | Disposition: A | Discharge status: Home Health | Payer: Self-pay | Source: Home / Self Care | Attending: Internal Medicine

## 2017-03-28 LAB — GLUCOSE, CAPILLARY
GLUCOSE-CAPILLARY: 122 mg/dL — AB (ref 65–99)
GLUCOSE-CAPILLARY: 235 mg/dL — AB (ref 65–99)
Glucose-Capillary: 142 mg/dL — ABNORMAL HIGH (ref 65–99)
Glucose-Capillary: 131 mg/dL — ABNORMAL HIGH (ref 65–99)
Glucose-Capillary: 204 mg/dL — ABNORMAL HIGH (ref 65–99)
Glucose-Capillary: 245 mg/dL — ABNORMAL HIGH (ref 65–99)

## 2017-03-28 LAB — CBC
HEMATOCRIT: 25.1 % — AB (ref 36.0–46.0)
HEMOGLOBIN: 8.1 g/dL — AB (ref 12.0–15.0)
MCH: 25.9 pg — ABNORMAL LOW (ref 26.0–34.0)
MCHC: 32.3 g/dL (ref 30.0–36.0)
MCV: 80.2 fL (ref 78.0–100.0)
Platelets: 221 10*3/uL (ref 150–400)
RBC: 3.13 MIL/uL — ABNORMAL LOW (ref 3.87–5.11)
RDW: 15.4 % (ref 11.5–15.5)
WBC: 14.5 10*3/uL — AB (ref 4.0–10.5)

## 2017-03-28 LAB — BLOOD GAS, ARTERIAL
ACID-BASE DEFICIT: 2.4 mmol/L — AB (ref 0.0–2.0)
BICARBONATE: 22.8 mmol/L (ref 20.0–28.0)
DRAWN BY: 514251
O2 CONTENT: 3 L/min
O2 SAT: 96.8 %
PH ART: 7.336 — AB (ref 7.350–7.450)
Patient temperature: 98.6
pCO2 arterial: 43.8 mmHg (ref 32.0–48.0)
pO2, Arterial: 107 mmHg (ref 83.0–108.0)

## 2017-03-28 LAB — BASIC METABOLIC PANEL
ANION GAP: 10 (ref 5–15)
BUN: 21 mg/dL — ABNORMAL HIGH (ref 6–20)
CHLORIDE: 106 mmol/L (ref 101–111)
CO2: 19 mmol/L — AB (ref 22–32)
Calcium: 8.8 mg/dL — ABNORMAL LOW (ref 8.9–10.3)
Creatinine, Ser: 1.02 mg/dL — ABNORMAL HIGH (ref 0.44–1.00)
GFR calc non Af Amer: >60 mL/min (ref >60)
GLUCOSE: 158 mg/dL — AB (ref 65–99)
Potassium: 4.4 mmol/L (ref 3.5–5.1)
Sodium: 135 mmol/L (ref 135–145)

## 2017-03-28 SURGERY — MINOR IRRIGATION AND DEBRIDEMENT EXTREMITY
Anesthesia: General | Laterality: Left

## 2017-03-28 MED ORDER — SODIUM CHLORIDE 0.9 % IV SOLN
1000.0000 mL | INTRAVENOUS | Status: DC
Start: 2017-03-28 — End: 2017-03-29
  Administered 2017-03-28: 1000 mL via INTRAVENOUS

## 2017-03-28 NOTE — Progress Notes (Signed)
[facility]opaedic Trauma Progress Note  S: MRI performed yesterday, continues to have pain, blistering worse  O:  Vitals:   03/28/17 0800 03/28/17 1000  BP: 140/84 (!) 164/86  Pulse: (!) 103 (!) 111  Resp: (!) 23 20  Temp:    SpO2: 95% 96%   LLE: Area of blistering and fluctuance in 1st webspace and over second toe, midfoot and proximal with mild erythema. Worsening blistering  Imaging: No focal abscess or collection other than blistering on dorsum of foot.  Labs: WBC back up  to >65  A/P: 41 year old female with diabetes and peripheral neuropathy with left lower extremity cellulitis and what appears to be forefoot abscess/collection  -I decompressed blister on dorsum of the foot. Purulent material expressed -Debrided dead skin at bedside -Wet to dry dressing placed -BID wet to dry dressing changes, will hold off on formal OR debridment -Continue to follow inflammatory markers  Shona Needles, MD [facility] [phone] (phone)

## 2017-03-28 NOTE — Progress Notes (Signed)
[facility] PROGRESS NOTE  [name] [name] YSA:[id number] DOB: 1976-02-19 DOA: 03/24/2017 PCP: Colbert Ewing, MD  Interim summary and HPI 41 y.o. female with medical history significant of necrotizing fasciitis of the groin, diabetic neuropathy,  T2DM, HTN, OSA on CPAP, CKD stage 2-3and Obesity chronic back pain and bipolar disorder; admitted secondary to severe sepsis secondary to left lower extremity cellulitis.  Assessment/Plan: 1-severe sepsis: Appears to be secondary to left lower extremity cellulitis Patient met sepsis criteria with active hypertension at the moment of admission (which responded appropriately to fluid resuscitation and stopping home antihypertensive regimens) -Will continue IV fluids, but will adjust rate. Continue supportive care and current IV antibiotics. Will follow clinical response and adjust regimen according to cultures results. -WBC's plateau high; no fever -patient reported improvement on pain; MRI w/o discrete abscess or bone involvement. Orthopedic service has performed I&D of her foot, will follow rec's on wound care.  2-acute on chronic renal failure: Stage II-III at baseline. -Continue holding nephrotoxic agents.   -Continue IV fluids; but will adjust rate -Follow renal function trend. Cr significantly improved. -No signs of infection in her urine.  3-diabetes with renal manifestation and neuropathy -will continue SSI -continue Lyrica -continue modified carb diet -follow CBG's and if needed will add long acting   4-depression -Will continue Elavil -Psychiatry has been consulted and recommended to continue home regimen and outpatient follow up. -patient denies SI or hallucinations currently   5-essential hypertension -BP elevated.  -will continue using PRN hydralazine for now -follow VS closely -patient just recovering from very low BP in setting of sepsis and had AKI; continue holding ACE inhibitors for another 24-48 hours.    6-obstructive sleep apnea -Continue CPAP QHS -Patient reports no compliance at home secondary to no proper fitting of her CPAP mask.  7-morbid obesity -Body mass index is 50.26 kg/m.  -Low calorie diet and weight loss has been discussed with patient  8-hyperlipidemia. -will continue statins.  9-chronic diastolic heart failure -remains Compensated -continue heart healthy diet  -Continue Daily weights and strict input and output.  Code Status: Full code Family Communication: uncle at bedside  Disposition Plan: will transfer to telemetry; continue current antibiotics; follow culture data and further rec's from orthopedic service regarding further wound care and extra needs for debridement.   Consultants:  PCCM  Orthopedic service   Psychiatry service  Procedures:  See below for x-ray reports.  Antibiotics:  Cefepime, Flagyl and doxycycline 11/2  HPI/Subjective: Afebrile, no CP, no nausea, no vomiting. Reporting some improvement in her LLE pain and swelling. S/P I&D by orthopedic service and with clean dressings in place.  Objective: Vitals:   03/28/17 1300 03/28/17 1400  BP: (!) 177/96 (!) 136/35  Pulse: (!) 115 (!) 106  Resp:    Temp:    SpO2: 94% 98%    Intake/Output Summary (Last 24 hours) at 03/28/2017 1520 Last data filed at 03/28/2017 1200 Gross per 24 hour  Intake 2470 ml  Output 1000 ml  Net 1470 ml   Filed Weights   03/25/17 0400 03/26/17 0350 03/27/17 0830  Weight: (!) 157 kg (346 lb 2 oz) (!) 157.8 kg (347 lb 14.2 oz) (!) 158.9 kg (350 lb 5 oz)    Exam:   General: no fever, AAOX3, no CP, no SOB. Patient reported improvement in her left foot pain after I&D today; still with some pain in her LLE.   Cardiovascular: RRR, no rubs, no gallops, no JVD (even difficult to properly assess due to body  habitus)  Respiratory: normal resp effort, no wheezing, no crackles  Abdomen: obese, soft, NT, ND, positive BS.    Musculoskeletal: LLE with  decrease swelling overall, still tender on palpation, less red. Left foot s/o I&D and with packing in place. Per orthopedic service purulent drainage freely pouring after incision.   Data Reviewed: Basic Metabolic Panel: Recent Labs  Lab 03/24/17 1904 03/24/17 1909 03/25/17 0913 03/26/17 0320 03/27/17 0435 03/28/17 0318  NA 132* 132* 135 137 136 135  K 4.8 4.7 4.6 4.8 4.9 4.4  CL 96* 97* 104 106 106 106  CO2 26  --  21* 21* 20* 19*  GLUCOSE 221* 222* 161* 207* 139* 158*  BUN 40* 39* 39* 35* 25* 21*  CREATININE 1.85* 1.90* 1.70* 1.48* 1.15* 1.02*  CALCIUM 8.6*  --  8.0* 8.4* 9.1 8.8*  MG  --   --  2.4  --   --   --   PHOS  --   --  3.7  --   --   --    Liver Function Tests: Recent Labs  Lab 03/24/17 1904 03/25/17 0913  AST 19 23  ALT 17 18  ALKPHOS 118 105  BILITOT 0.8 0.8  PROT 7.8 7.0  ALBUMIN 2.7* 2.3*   CBC: Recent Labs  Lab 03/24/17 1904 03/24/17 1909 03/25/17 0445 03/26/17 0320 03/27/17 0435 03/28/17 0318  WBC 19.5*  --  14.5* 14.7* 12.2* 14.5*  NEUTROABS 16.1*  --   --   --   --   --   HGB 9.6* 10.2* 8.5* 8.4* 10.6* 8.1*  HCT 29.4* 30.0* 25.4* 26.4* 32.1* 25.1*  MCV 80.8  --  82.2 81.5 80.0 80.2  PLT 236  --  153 212 220 221   Cardiac Enzymes: Recent Labs  Lab 03/24/17 2324 03/25/17 0913 03/25/17 1900  CKTOTAL 85  --   --   TROPONINI <0.03 <0.03 0.07*   CBG: Recent Labs  Lab 03/27/17 1936 03/27/17 2306 03/28/17 0307 03/28/17 0729 03/28/17 1157  GLUCAP 131* 207* 142* 122* 131*    Recent Results (from the past 240 hour(s))  Blood Culture (routine x 2)     Status: None (Preliminary result)   Collection Time: 03/24/17  6:28 PM  Result Value Ref Range Status   Specimen Description BLOOD RIGHT ANTECUBITAL  Final   Special Requests IN PEDIATRIC BOTTLE Blood Culture adequate volume  Final   Culture   Final    NO GROWTH 4 DAYS Performed at [facility], [place] [address], [place], [place] [address]    Report Status PENDING  Incomplete   Blood Culture (routine x 2)     Status: None (Preliminary result)   Collection Time: 03/24/17  7:04 PM  Result Value Ref Range Status   Specimen Description BLOOD LEFT ANTECUBITAL  Final   Special Requests   Final    BOTTLES DRAWN AEROBIC AND ANAEROBIC Blood Culture results may not be optimal due to an inadequate volume of blood received in culture bottles   Culture   Final    NO GROWTH 4 DAYS Performed at [facility], [place] [address], [place], [place] [address]    Report Status PENDING  Incomplete  MRSA PCR Screening     Status: Abnormal   Collection Time: 03/25/17  3:53 AM  Result Value Ref Range Status   MRSA by PCR POSITIVE (A) NEGATIVE Final    Comment:        The GeneXpert MRSA Assay (FDA approved  for NASAL specimens only), is one component of a comprehensive MRSA colonization surveillance program. It is not intended to diagnose MRSA infection nor to guide or monitor treatment for MRSA infections. RESULT CALLED TO, READ BACK BY AND VERIFIED WITH: FRANKLIN,S AT 1055 ON [id number] BY HOOKER,B      Studies: Mr Tibia Fibula Left W Wo Contrast  Result Date: 03/27/2017 CLINICAL DATA:  Erythema and swelling of the left lower leg x3 days. EXAM: MRI OF LOWER LEFT EXTREMITY WITHOUT AND WITH CONTRAST TECHNIQUE: Multiplanar, multisequence MR imaging of the left tibia and fibula including the left foot was performed both before and after administration of intravenous contrast. CONTRAST:  20 cc MULTIHANCE GADOBENATE DIMEGLUMINE 529 MG/ML IV SOLN COMPARISON:  03/24/2017 radiographs and CT of the left foot FINDINGS: Bones/Joint/Cartilage No acute fracture, bone destruction or marrow signal abnormalities. Degenerative subchondral cystic change of the midfoot consistent with osteoarthritis. No evidence of acute osteomyelitis. Ligaments Noncontributory Muscles and Tendons No evidence of pyomyositis or muscle atrophy. Intact tendons crossing the ankle joint. Soft tissues Diffuse soft tissue  edema of the included left leg and foot with dermal blistering along the dorsum of the forefoot measuring 6 cm in length by 0.8 cm in thickness. IMPRESSION: 1. Diffuse subcutaneous edema of the included left foot, ankle and leg consistent with cellulitis, venous insufficiency and/or third spacing of fluid. 2. No acute marrow signal abnormality indicative acute osteomyelitis, fracture or suspicious osseous lesion. Electronically Signed   By: Ashley Royalty M.D.   On: 03/27/2017 19:55    Scheduled Meds: . acetaminophen  1,000 mg Oral Once  . amitriptyline  50 mg Oral QHS  . atorvastatin  20 mg Oral q1800  . Chlorhexidine Gluconate Cloth  6 each Topical Q0600  . insulin aspart  0-9 Units Subcutaneous Q4H  . insulin glargine  40 Units Subcutaneous QHS  . mupirocin ointment  1 application Nasal BID  . prednisoLONE acetate  1 drop Right Eye QID  . pregabalin  100 mg Oral BID   Continuous Infusions: . sodium chloride 1,000 mL (03/28/17 1200)  . ceFEPime (MAXIPIME) IV 2 g (03/28/17 1129)  . doxycycline (VIBRAMYCIN) IV Stopped (03/28/17 1129)  . metronidazole 500 mg (03/28/17 0533)    Principal Problem:   Adjustment disorder with depressed mood Active Problems:   Hyperlipidemia   Essential hypertension   Diabetic peripheral neuropathy (HCC)   Uncontrolled type 2 diabetes mellitus (HCC)   OSA on CPAP   CKD (chronic kidney disease) stage 2, GFR 60-89 ml/min   Severe sepsis (HCC)   Hyponatremia   Abnormal ECG   Sepsis ([place])    Time spent: 30 minutes    Barton Dubois  [facility] Pager [phone]. If 7PM-7AM, please contact night-coverage at [email or web address], password [facility] 03/28/2017, 3:20 PM  LOS: 4 days

## 2017-03-28 NOTE — Progress Notes (Deleted)
[facility] Note  03/29/2017     CHIEF COMPLAINT Patient presents for No chief complaint on file.   HISTORY OF PRESENT ILLNESS: [name] [name] is a 41 y.o. female who presents to the clinic today for:     Referring physician: Colbert Ewing, MD [place], [place] [address]  HISTORICAL INFORMATION:   Selected notes from the MEDICAL RECORD NUMBER Referral from [name] and [name] for concern of diabetic retinopathy;  Ocular Hx- PDR OU; cataracts OU; DES/Blepharitis OU PMH- HTN; High chol.; Type 2 DM   CURRENT MEDICATIONS: No current facility-administered medications for this visit.  (Ophthalmic Drugs)   No current outpatient medications on file. (Ophthalmic Drugs)   Facility-Administered Medications Ordered in Other Visits (Ophthalmic Drugs)  Medication Route   prednisoLONE acetate (PRED FORTE) 1 % ophthalmic suspension 1 drop Right Eye   No current facility-administered medications for this visit.  (Other)   No current outpatient medications on file. (Other)   Facility-Administered Medications Ordered in Other Visits (Other)  Medication Route   0.9 %  sodium chloride infusion Intravenous   acetaminophen (TYLENOL) tablet 650 mg Oral   Or   acetaminophen (TYLENOL) suppository 650 mg Rectal   acetaminophen (TYLENOL) tablet 1,000 mg Oral   amitriptyline (ELAVIL) tablet 50 mg Oral   atorvastatin (LIPITOR) tablet 20 mg Oral   ceFEPIme (MAXIPIME) 2 g in dextrose 5 % 50 mL IVPB Intravenous   Chlorhexidine Gluconate Cloth 2 % PADS 6 each Topical   doxycycline (VIBRAMYCIN) 100 mg in dextrose 5 % 250 mL IVPB Intravenous   hydrALAZINE (APRESOLINE) injection 10 mg Intravenous   HYDROcodone-acetaminophen (NORCO/VICODIN) 5-325 MG per tablet 1-2 tablet Oral   insulin aspart (novoLOG) injection 0-9 Units Subcutaneous   insulin glargine (LANTUS) injection 40 Units Subcutaneous   loratadine (CLARITIN) tablet 10 mg Oral    metroNIDAZOLE (FLAGYL) IVPB 500 mg Intravenous   morphine 4 MG/ML injection 2 mg Intravenous   mupirocin ointment (BACTROBAN) 2 % 1 application Nasal   ondansetron (ZOFRAN) tablet 4 mg Oral   Or   ondansetron (ZOFRAN) injection 4 mg Intravenous   pregabalin (LYRICA) capsule 100 mg Oral      REVIEW OF SYSTEMS:    ALLERGIES Allergies  Allergen Reactions   Vancomycin Other (See Comments)    Acute renal failure suspected secondary to vanco    PAST MEDICAL HISTORY Past Medical History:  Diagnosis Date   Abnormal Pap smear of cervix 2009   Abscess    history of multiple abscesses   Anemia of chronic disease 2002   Anxiety    Bipolar disorder ([place])    Cellulitis 05/21/2014   right eye   Chronic bronchitis ([employer])    "get it q yr" (05/13/2013)   Chronic pain    Depression    Edema of lower extremity    Endocarditis 2002   subacute bacterial endocarditis.    Family history of anesthesia complication    "my mom has a hard time coming out from under"   GERD (gastroesophageal reflux disease)    Heart murmur    History of blood transfusion    "just low blood count" (05/13/2013)   Hyperlipidemia    Hypertension    Hypothyroidism    Hypothyroidism, adult 03/21/2014   Obesity    OSA on CPAP    Peripheral neuropathy    Type II diabetes mellitus ([place])    Past Surgical History:  Procedure Laterality Date   INCISION  AND DRAINAGE ABSCESS     multiple I&Ds   TONSILLECTOMY  1994    FAMILY HISTORY Family History  Problem Relation Age of Onset   Heart failure Mother    Diabetes Mother    Kidney disease Mother    Kidney disease Father    Diabetes Father    Diabetes Paternal Grandmother    Heart failure Paternal Grandmother    Other Other     SOCIAL HISTORY Social History   Tobacco Use   Smoking status: Current Every Day Smoker    Packs/day: 0.50    Years: 6.00    Pack years: 3.00    Types: Cigarettes   Smokeless  tobacco: Never Used   Tobacco comment: Cutting back  Substance Use Topics   Alcohol use: Yes    Alcohol/week: 0.0 oz    Comment: Socially.   Drug use: No         OPHTHALMIC EXAM:   Not recorded      IMAGING AND PROCEDURES  Imaging and Procedures for 03/28/17           ASSESSMENT/PLAN:    ICD-10-CM   1. Proliferative diabetic retinopathy of both eyes without macular edema associated with type 2 diabetes mellitus ([place]) E11.3593 OCT, Retina - OU - Both Eyes  2. Retinal edema H35.81   3. Cortical cataract of both eyes H26.9   4. Hypertensive retinopathy of both eyes H35.033     1,2. Proliferative diabetic retinopathy, both eyes The incidence, risk factors for progression, natural history and treatment options for diabetic retinopathy  were discussed with patient.  The need for close monitoring of blood glucose, blood pressure, and serum lipids, avoiding cigarette or any type of tobacco, and the need for long term follow up was also discussed with patient. - IRMA and early NV with leakage noted on FA in addition to significant patches of capillary nonperfusion - No visually significant diabetic macular edema on OCT, both eyes  - discussed treatment options for PDR including laser, antiVEGF therapy - recommend laser PRP OU, OD first -- pt wishes to proceed - RBA of procedure discussed, questions answered - informed consent obtained and signed - see procedure note - start PF QID OD x7days - f/u in *** wks for PRP OS  3. Cortical cataract OU - The symptoms of cataract, surgical options, and treatments and risks were discussed with patient. - discussed diagnosis and progression - not yet visually significant - monitor for now  4. Hypertensive retinopathy OU - discussed the importance of tight BP control - monitor  Ophthalmic Meds Ordered this visit:  No orders of the defined types were placed in this encounter.      No Follow-up on file.  There are no  Patient Instructions on file for this visit.   Explained the diagnoses, plan, and follow up with the patient and they expressed understanding.  Patient expressed understanding of the importance of proper follow up care.   Gardiner Sleeper, M.D., Ph.D. Diseases & Surgery of the Retina and [place] 03/28/17     Abbreviations: M myopia (nearsighted); A astigmatism; H hyperopia (farsighted); P presbyopia; Mrx spectacle prescription;  CTL contact lenses; OD right eye; OS left eye; OU both eyes  XT exotropia; ET esotropia; PEK punctate epithelial keratitis; PEE punctate epithelial erosions; DES dry eye syndrome; MGD meibomian gland dysfunction; ATs artificial tears; PFAT's preservative free artificial tears; [place] nuclear sclerotic cataract; PSC posterior subcapsular cataract; ERM epi-retinal membrane; PVD  posterior vitreous detachment; RD retinal detachment; DM diabetes mellitus; DR diabetic retinopathy; NPDR non-proliferative diabetic retinopathy; PDR proliferative diabetic retinopathy; CSME clinically significant macular edema; DME diabetic macular edema; dbh dot blot hemorrhages; CWS cotton wool spot; POAG primary open angle glaucoma; C/D cup-to-disc ratio; HVF humphrey visual field; GVF goldmann visual field; OCT optical coherence tomography; IOP intraocular pressure; BRVO Branch retinal vein occlusion; CRVO central retinal vein occlusion; CRAO central retinal artery occlusion; BRAO branch retinal artery occlusion; RT retinal tear; SB scleral buckle; PPV pars plana vitrectomy; VH Vitreous hemorrhage; PRP panretinal laser photocoagulation; IVK intravitreal kenalog; VMT vitreomacular traction; MH Macular hole;  NVD neovascularization of the disc; NVE neovascularization elsewhere; AREDS age related eye disease study; ARMD age related macular degeneration; POAG primary open angle glaucoma; EBMD epithelial/anterior basement membrane dystrophy; ACIOL anterior chamber  intraocular lens; IOL intraocular lens; PCIOL posterior chamber intraocular lens; Phaco/IOL phacoemulsification with intraocular lens placement; [place] photorefractive keratectomy; LASIK laser assisted in situ keratomileusis; HTN hypertension; DM diabetes mellitus; COPD chronic obstructive pulmonary disease

## 2017-03-29 ENCOUNTER — Ambulatory Visit: Payer: Medicare Other | Admitting: Physical Therapy

## 2017-03-29 ENCOUNTER — Encounter (INDEPENDENT_AMBULATORY_CARE_PROVIDER_SITE_OTHER): Payer: Self-pay | Admitting: Ophthalmology

## 2017-03-29 DIAGNOSIS — F4321 Adjustment disorder with depressed mood: Secondary | ICD-10-CM

## 2017-03-29 LAB — CULTURE, BLOOD (ROUTINE X 2)
Culture: NO GROWTH
Culture: NO GROWTH
SPECIAL REQUESTS: ADEQUATE

## 2017-03-29 LAB — BASIC METABOLIC PANEL
ANION GAP: 10 (ref 5–15)
BUN: 20 mg/dL (ref 6–20)
CALCIUM: 8.8 mg/dL — AB (ref 8.9–10.3)
CO2: 18 mmol/L — AB (ref 22–32)
Chloride: 106 mmol/L (ref 101–111)
Creatinine, Ser: 0.89 mg/dL (ref 0.44–1.00)
Glucose, Bld: 135 mg/dL — ABNORMAL HIGH (ref 65–99)
POTASSIUM: 4.8 mmol/L (ref 3.5–5.1)
Sodium: 134 mmol/L — ABNORMAL LOW (ref 135–145)

## 2017-03-29 LAB — IRON AND TIBC
Iron: 12 ug/dL — ABNORMAL LOW (ref 28–170)
Saturation Ratios: 12 % (ref 10.4–31.8)
TIBC: 104 ug/dL — ABNORMAL LOW (ref 250–450)
UIBC: 92 ug/dL

## 2017-03-29 LAB — CBC
HEMATOCRIT: 25.9 % — AB (ref 36.0–46.0)
Hemoglobin: 8.4 g/dL — ABNORMAL LOW (ref 12.0–15.0)
MCH: 26 pg (ref 26.0–34.0)
MCHC: 32.4 g/dL (ref 30.0–36.0)
MCV: 80.2 fL (ref 78.0–100.0)
Platelets: 240 10*3/uL (ref 150–400)
RBC: 3.23 MIL/uL — AB (ref 3.87–5.11)
RDW: 15.3 % (ref 11.5–15.5)
WBC: 13.7 10*3/uL — AB (ref 4.0–10.5)

## 2017-03-29 LAB — GLUCOSE, CAPILLARY
GLUCOSE-CAPILLARY: 134 mg/dL — AB (ref 65–99)
GLUCOSE-CAPILLARY: 121 mg/dL — AB (ref 65–99)
GLUCOSE-CAPILLARY: 164 mg/dL — AB (ref 65–99)
GLUCOSE-CAPILLARY: 186 mg/dL — AB (ref 65–99)
Glucose-Capillary: 196 mg/dL — ABNORMAL HIGH (ref 65–99)
Glucose-Capillary: 200 mg/dL — ABNORMAL HIGH (ref 65–99)
Glucose-Capillary: 231 mg/dL — ABNORMAL HIGH (ref 65–99)

## 2017-03-29 LAB — RETICULOCYTES
RBC.: 3.17 MIL/uL — AB (ref 3.87–5.11)
RETIC COUNT ABSOLUTE: 15.9 10*3/uL — AB (ref 19.0–186.0)
Retic Ct Pct: 0.5 % (ref 0.4–3.1)

## 2017-03-29 LAB — FOLATE: FOLATE: 12.8 ng/mL (ref >5.9)

## 2017-03-29 LAB — VITAMIN B12: VITAMIN B 12: 1098 pg/mL — AB (ref 180–914)

## 2017-03-29 LAB — FERRITIN: Ferritin: 349 ng/mL — ABNORMAL HIGH (ref 11–307)

## 2017-03-29 MED ORDER — FUROSEMIDE 10 MG/ML IJ SOLN
20.0000 mg | Freq: Every day | INTRAMUSCULAR | Status: DC
  Administered 2017-03-29 – 2017-03-30 (×2): 20 mg via INTRAVENOUS
  Filled 2017-03-29 (×2): qty 2

## 2017-03-29 MED ORDER — MORPHINE SULFATE (PF) 4 MG/ML IV SOLN
2.0000 mg | INTRAVENOUS | Status: DC | PRN
  Administered 2017-03-29 – 2017-04-02 (×15): 2 mg via INTRAVENOUS
  Filled 2017-03-29 (×15): qty 1

## 2017-03-29 NOTE — Evaluation (Signed)
Physical Therapy Evaluation Patient Details Name: [name] [name] MRN: [id number] DOB: 07-16-1975 Today's Date: 03/29/2017   History of Present Illness  41 y.o. femalewith medical history significant ofnecrotizing fasciitis of the groin,diabetic neuropathy, T2DM, HTN, OSA on CPAP, CKD stage 2-3and Obesitychronic back pain and bipolar disorder; admitted secondary to severe sepsis secondary to left lower extremity cellulitis.  Clinical Impression  The patient did stand and  Take a few sidesteps along the bed. Reports pain in left foot is 10/10. RN aware. Pt admitted with above diagnosis. Pt currently with functional limitations due to the deficits listed below (see PT Problem List).  Pt will benefit from skilled PT to increase their independence and safety with mobility to allow discharge to the venue listed below.       Follow Up Recommendations SNF    Equipment Recommendations  Rolling walker with 5" wheels(bariatric)    Recommendations for Other Services       Precautions / Restrictions Precautions Precautions: Fall Precaution Comments: left foot wound      Mobility  Bed Mobility Overal bed mobility: Needs Assistance Bed Mobility: Supine to Sit;Sit to Supine     Supine to sit: Mod assist Sit to supine: Mod assist   General bed mobility comments: assist with legs off bed and onto bed, use of rail  Transfers Overall transfer level: Needs assistance Equipment used: Rolling walker (2 wheeled) Transfers: Sit to/from Stand Sit to Stand: Mod assist;From elevated surface         General transfer comment: slow to rise from bed, left leg to the side. Side steps along the bed x 4 , very antalgic on the foot  Ambulation/Gait                Stairs            Wheelchair Mobility    Modified Rankin (Stroke Patients Only)       Balance                                             Pertinent Vitals/Pain Pain Assessment: 0-10 Pain  Score: 10-Worst pain ever Pain Location: left foot Pain Descriptors / Indicators: Constant;Crushing;Crying;Discomfort;Grimacing;Guarding Pain Intervention(s): Monitored during session;Premedicated before session;Repositioned;Patient requesting pain meds-RN notified    Home Living Family/patient expects to be discharged to:: Private residence Living Arrangements: Parent   Type of Home: House Home Access: Stairs to enter Entrance Stairs-Rails: Psychiatric nurse of Steps: 3 Home Layout: One level Home Equipment: None      Prior Function Level of Independence: Independent               Hand Dominance        Extremity/Trunk Assessment   Upper Extremity Assessment Upper Extremity Assessment: Overall WFL for tasks assessed    Lower Extremity Assessment Lower Extremity Assessment: LLE deficits/detail LLE Deficits / Details: dressing on foot, decreased weight on the  foot    Cervical / Trunk Assessment Cervical / Trunk Assessment: Normal  Communication   Communication: No difficulties  Cognition Arousal/Alertness: Awake/alert Behavior During Therapy: WFL for tasks assessed/performed Overall Cognitive Status: Within Functional Limits for tasks assessed                                        General Comments  Exercises     Assessment/Plan    PT Assessment Patient needs continued PT services  PT Problem List Decreased strength;Decreased range of motion;Decreased activity tolerance;Decreased mobility;Pain;Obesity       PT Treatment Interventions DME instruction;Gait training;Functional mobility training;Therapeutic activities;Stair training;Patient/family education    PT Goals (Current goals can be found in the Care Plan section)  Acute Rehab PT Goals Patient Stated Goal: to go home PT Goal Formulation: With patient Time For Goal Achievement: 04/12/17 Potential to Achieve Goals: Fair    Frequency Min 3X/week   Barriers  to discharge Decreased caregiver support      Co-evaluation               AM-PAC PT "6 Clicks" Daily Activity  Outcome Measure Difficulty turning over in bed (including adjusting bedclothes, sheets and blankets)?: Unable Difficulty moving from lying on back to sitting on the side of the bed? : Unable Difficulty sitting down on and standing up from a chair with arms (e.g., wheelchair, bedside commode, etc,.)?: Unable Help needed moving to and from a bed to chair (including a wheelchair)?: Total Help needed walking in hospital room?: Total Help needed climbing 3-5 steps with a railing? : Total 6 Click Score: 6    End of Session   Activity Tolerance: Patient limited by pain Patient left: in bed;with call bell/phone within reach Nurse Communication: Mobility status PT Visit Diagnosis: Unsteadiness on feet (R26.81)    Time: 1705-1730 PT Time Calculation (min) (ACUTE ONLY): 25 min   Charges:   PT Evaluation $PT Eval Moderate Complexity: 1 Mod PT Treatments $Therapeutic Activity: 8-22 mins   PT G CodesTresa Endo PT [phone]   Claretha Cooper 03/29/2017, 5:42 PM

## 2017-03-29 NOTE — Care Management Note (Signed)
Case Management Note  Patient Details  Name: [name] [name] MRN: [id number] Date of Birth: 05/15/1976  Subjective/Objective: Transfer from SDU. L lower leg cellulitis. Contact prec-MRSA. Hx: bipolar,depression. Psych cons. PT cons.                   Action/Plan:d/c plan home.   Expected Discharge Date:  (unknown)               Expected Discharge Plan:  [place]  In-House Referral:  Clinical Social Work  Discharge planning Services  CM Consult  Post Acute Care Choice:    Choice offered to:     DME Arranged:    DME Agency:     HH Arranged:    [place] Agency:     Status of Service:  In process, will continue to follow  If discussed at Long Length of Stay Meetings, dates discussed:    Additional Comments:  Dessa Phi, RN 03/29/2017, 1:49 PM

## 2017-03-29 NOTE — Progress Notes (Signed)
Pt refused CPAP qhs.  Education provided and per Pt request CPAP left in room.  Pt encouraged to contact RT if she changes her mind.

## 2017-03-29 NOTE — Care Management Important Message (Signed)
Important Message  Patient Details IM Letter given to Kathy/Case Manager to present to Patient Name: [name] [name] MRN: [id number] Date of Birth: 08/20/1975   Medicare Important Message Given:  Yes    Kerin Salen 03/29/2017, 12:03 PM

## 2017-03-29 NOTE — Progress Notes (Signed)
PROGRESS NOTE    [name] [name]  FBP:[id number] DOB: 09/06/1975 DOA: 03/24/2017 PCP: Colbert Ewing, MD   Brief Narrative: 41 y.o. femalewith medical history significant ofnecrotizing fasciitis of the groin,diabetic neuropathy, T2DM, HTN, OSA on CPAP, CKD stage 2-3and Obesitychronic back pain and bipolar disorder; admitted secondary to severe sepsis secondary to left lower extremity cellulitis.    Assessment & Plan:   Principal Problem:   Adjustment disorder with depressed mood Active Problems:   Hyperlipidemia   Essential hypertension   Diabetic peripheral neuropathy (HCC)   Uncontrolled type 2 diabetes mellitus (HCC)   OSA on CPAP   CKD (chronic kidney disease) stage 2, GFR 60-89 ml/min   Severe sepsis (HCC)   Hyponatremia   Abnormal ECG   Sepsis ([place])   Diabetic foot ulcer with surrounding cellulitis/ sepsis  Sepsis improved.  MRI does not show any bone involvement.  Orthopedics consulted for recommendations.  Wound care.  Currently on IV antibiotics.  Will need diuresis to decrease the swelling in the leg.  Monitor renal parameters on IV lasix.     Acute on CKD STAGE 3; Will do small dose of lasix for diuresis.  Recheck creatinine in am.   Diabetes mellitus:  CBG (last 3)  Recent Labs    03/29/17 0739 03/29/17 1221 03/29/17 1729  GLUCAP 121* 164* 196*   Resume SSI.    OSA:  Resume CPAP at night.    Morbid obesity:  Low calorie diet and dietary consult.    Chronic diastolic heart failure:  Appears compensated.     Hypertension;  Not well controlled. Possibly from pain .  Better pain control and prn IV hydralazine.    Depression:  Resume home meds.     DVT prophylaxis:  Code Status: full code Family Communication: none at bedside Disposition Plan: pending further eval   Consultants:   Orthopedics.    Procedures: I&D   Antimicrobials:    Subjective: Pain not well controlled.  Objective: Vitals:   03/28/17  1952 03/28/17 2302 03/29/17 0010 03/29/17 1319  BP:   131/77 116/70  Pulse:   (!) 102 91  Resp:   (!) 22   Temp: 99.6 F (37.6 C) 98.6 F (37 C) 98.7 F (37.1 C) (!) 97.4 F (36.3 C)  TempSrc: Oral Oral Oral Oral  SpO2:   100% 98%  Weight:   (!) 159.6 kg (351 lb 13.7 oz)   Height:   5\' 10"  (1.778 m)     Intake/Output Summary (Last 24 hours) at 03/29/2017 1525 Last data filed at 03/29/2017 1400 Gross per 24 hour  Intake 2265 ml  Output 2300 ml  Net -35 ml   Filed Weights   03/26/17 0350 03/27/17 0830 03/29/17 0010  Weight: (!) 157.8 kg (347 lb 14.2 oz) (!) 158.9 kg (350 lb 5 oz) (!) 159.6 kg (351 lb 13.7 oz)    Examination:  General exam: Appears calm and comfortable  Respiratory system: Clear to auscultation. Respiratory effort normal. Cardiovascular system: S1 & S2 heard, RRR. No JVD, murmurs, rubs, gallops or clicks. No pedal edema. Gastrointestinal system: Abdomen is nondistended, soft and nontender. No organomegaly or masses felt. Normal bowel sounds heard. Central nervous system: Alert and oriented. No focal neurological deficits. Extremities: lower extremity on the left with a 5 cm ulcer on the dorsum of the foot.  Skin: No rashes, lesions or ulcers Psychiatry: Judgement and insight appear normal. Mood & affect appropriate.     Data Reviewed: I have personally reviewed following labs  and imaging studies  CBC: Recent Labs  Lab 03/24/17 1904  03/25/17 0445 03/26/17 0320 03/27/17 0435 03/28/17 0318 03/29/17 0506  WBC 19.5*  --  14.5* 14.7* 12.2* 14.5* 13.7*  NEUTROABS 16.1*  --   --   --   --   --   --   HGB 9.6*   < > 8.5* 8.4* 10.6* 8.1* 8.4*  HCT 29.4*   < > 25.4* 26.4* 32.1* 25.1* 25.9*  MCV 80.8  --  82.2 81.5 80.0 80.2 80.2  PLT 236  --  153 212 220 221 240   < > = values in this interval not displayed.   Basic Metabolic Panel: Recent Labs  Lab 03/25/17 0913 03/26/17 0320 03/27/17 0435 03/28/17 0318 03/29/17 0506  NA 135 137 136 135 134*  K  4.6 4.8 4.9 4.4 4.8  CL 104 106 106 106 106  CO2 21* 21* 20* 19* 18*  GLUCOSE 161* 207* 139* 158* 135*  BUN 39* 35* 25* 21* 20  CREATININE 1.70* 1.48* 1.15* 1.02* 0.89  CALCIUM 8.0* 8.4* 9.1 8.8* 8.8*  MG 2.4  --   --   --   --   PHOS 3.7  --   --   --   --    GFR: Estimated Creatinine Clearance: 139.1 mL/min (by C-G formula based on SCr of 0.89 mg/dL). Liver Function Tests: Recent Labs  Lab 03/24/17 1904 03/25/17 0913  AST 19 23  ALT 17 18  ALKPHOS 118 105  BILITOT 0.8 0.8  PROT 7.8 7.0  ALBUMIN 2.7* 2.3*   No results for input(s): LIPASE, AMYLASE in the last 168 hours. No results for input(s): AMMONIA in the last 168 hours. Coagulation Profile: Recent Labs  Lab 03/24/17 2324  INR 1.37   Cardiac Enzymes: Recent Labs  Lab 03/24/17 2324 03/25/17 0913 03/25/17 1900  CKTOTAL 85  --   --   TROPONINI <0.03 <0.03 0.07*   BNP (last 3 results) No results for input(s): PROBNP in the last 8760 hours. HbA1C: No results for input(s): HGBA1C in the last 72 hours. CBG: Recent Labs  Lab 03/28/17 2302 03/29/17 0037 03/29/17 0536 03/29/17 0739 03/29/17 1221  GLUCAP 235* 200* 134* 121* 164*   Lipid Profile: No results for input(s): CHOL, HDL, LDLCALC, TRIG, CHOLHDL, LDLDIRECT in the last 72 hours. Thyroid Function Tests: No results for input(s): TSH, T4TOTAL, FREET4, T3FREE, THYROIDAB in the last 72 hours. Anemia Panel: Recent Labs    03/29/17 0506 03/29/17 1052  VITAMINB12  --  1,098*  FOLATE  --  12.8  FERRITIN  --  349*  TIBC  --  104*  IRON  --  12*  RETICCTPCT 0.5  --    Sepsis Labs: Recent Labs  Lab 03/24/17 2030 03/24/17 2324 03/25/17 0416 03/25/17 0913 03/26/17 0320 03/27/17 0435  PROCALCITON  --  7.64  --  7.64 7.29  --   LATICACIDVEN 2.21* 0.8 1.1  --   --  1.3    Recent Results (from the past 240 hour(s))  Blood Culture (routine x 2)     Status: None   Collection Time: 03/24/17  6:28 PM  Result Value Ref Range Status   Specimen  Description BLOOD RIGHT ANTECUBITAL  Final   Special Requests IN PEDIATRIC BOTTLE Blood Culture adequate volume  Final   Culture   Final    NO GROWTH 5 DAYS Performed at [facility], [place] [address], [place], [place] [address]    Report Status 03/29/2017  FINAL  Final  Blood Culture (routine x 2)     Status: None   Collection Time: 03/24/17  7:04 PM  Result Value Ref Range Status   Specimen Description BLOOD LEFT ANTECUBITAL  Final   Special Requests   Final    BOTTLES DRAWN AEROBIC AND ANAEROBIC Blood Culture results may not be optimal due to an inadequate volume of blood received in culture bottles   Culture   Final    NO GROWTH 5 DAYS Performed at [facility], [place] [address] [place], [place] [address]    Report Status 03/29/2017 FINAL  Final  MRSA PCR Screening     Status: Abnormal   Collection Time: 03/25/17  3:53 AM  Result Value Ref Range Status   MRSA by PCR POSITIVE (A) NEGATIVE Final    Comment:        The GeneXpert MRSA Assay (FDA approved for NASAL specimens only), is one component of a comprehensive MRSA colonization surveillance program. It is not intended to diagnose MRSA infection nor to guide or monitor treatment for MRSA infections. RESULT CALLED TO, READ BACK BY AND VERIFIED WITH: FRANKLIN,S AT 1055 ON [id number] BY HOOKER,B          Radiology Studies: Mr Tibia Fibula Left W Wo Contrast  Result Date: 03/27/2017 CLINICAL DATA:  Erythema and swelling of the left lower leg x3 days. EXAM: MRI OF LOWER LEFT EXTREMITY WITHOUT AND WITH CONTRAST TECHNIQUE: Multiplanar, multisequence MR imaging of the left tibia and fibula including the left foot was performed both before and after administration of intravenous contrast. CONTRAST:  20 cc MULTIHANCE GADOBENATE DIMEGLUMINE 529 MG/ML IV SOLN COMPARISON:  03/24/2017 radiographs and CT of the left foot FINDINGS: Bones/Joint/Cartilage No acute fracture, bone destruction or marrow signal abnormalities.  Degenerative subchondral cystic change of the midfoot consistent with osteoarthritis. No evidence of acute osteomyelitis. Ligaments Noncontributory Muscles and Tendons No evidence of pyomyositis or muscle atrophy. Intact tendons crossing the ankle joint. Soft tissues Diffuse soft tissue edema of the included left leg and foot with dermal blistering along the dorsum of the forefoot measuring 6 cm in length by 0.8 cm in thickness. IMPRESSION: 1. Diffuse subcutaneous edema of the included left foot, ankle and leg consistent with cellulitis, venous insufficiency and/or third spacing of fluid. 2. No acute marrow signal abnormality indicative acute osteomyelitis, fracture or suspicious osseous lesion. Electronically Signed   By: Ashley Royalty M.D.   On: 03/27/2017 19:55        Scheduled Meds: . acetaminophen  1,000 mg Oral Once  . amitriptyline  50 mg Oral QHS  . atorvastatin  20 mg Oral q1800  . Chlorhexidine Gluconate Cloth  6 each Topical Q0600  . furosemide  20 mg Intravenous Daily  . insulin aspart  0-9 Units Subcutaneous Q4H  . insulin glargine  40 Units Subcutaneous QHS  . mupirocin ointment  1 application Nasal BID  . prednisoLONE acetate  1 drop Right Eye QID  . pregabalin  100 mg Oral BID   Continuous Infusions: . ceFEPime (MAXIPIME) IV Stopped (03/29/17 1307)  . doxycycline (VIBRAMYCIN) IV Stopped (03/29/17 1112)  . metronidazole Stopped (03/29/17 1430)     LOS: 5 days    Time spent: 35 minutes.     Hosie Poisson, MD [facility] Pager [phone]  If 7PM-7AM, please contact night-coverage [email or web address] Password [email or web address] 03/29/2017, 3:25 PM

## 2017-03-30 DIAGNOSIS — L97929 Non-pressure chronic ulcer of unspecified part of left lower leg with unspecified severity: Secondary | ICD-10-CM

## 2017-03-30 DIAGNOSIS — I87332 Chronic venous hypertension (idiopathic) with ulcer and inflammation of left lower extremity: Secondary | ICD-10-CM

## 2017-03-30 LAB — GLUCOSE, CAPILLARY
GLUCOSE-CAPILLARY: 170 mg/dL — AB (ref 65–99)
GLUCOSE-CAPILLARY: 167 mg/dL — AB (ref 65–99)
GLUCOSE-CAPILLARY: 171 mg/dL — AB (ref 65–99)
Glucose-Capillary: 164 mg/dL — ABNORMAL HIGH (ref 65–99)
Glucose-Capillary: 241 mg/dL — ABNORMAL HIGH (ref 65–99)

## 2017-03-30 LAB — BASIC METABOLIC PANEL
Anion gap: 8 (ref 5–15)
BUN: 21 mg/dL — ABNORMAL HIGH (ref 6–20)
CALCIUM: 8.9 mg/dL (ref 8.9–10.3)
CHLORIDE: 106 mmol/L (ref 101–111)
CO2: 20 mmol/L — ABNORMAL LOW (ref 22–32)
CREATININE: 0.88 mg/dL (ref 0.44–1.00)
Glucose, Bld: 157 mg/dL — ABNORMAL HIGH (ref 65–99)
Potassium: 5.3 mmol/L — ABNORMAL HIGH (ref 3.5–5.1)
SODIUM: 134 mmol/L — AB (ref 135–145)

## 2017-03-30 MED ORDER — FUROSEMIDE 10 MG/ML IJ SOLN
20.0000 mg | Freq: Once | INTRAMUSCULAR | Status: AC
  Administered 2017-03-30: 20 mg via INTRAVENOUS
  Filled 2017-03-30: qty 2

## 2017-03-30 NOTE — Progress Notes (Signed)
[facility] will provide [facility] services for pt at DC including [place], PT, OT, [place] and [place].  [facility] will also provide [facility] services if home IV ABX are needed at DC.  If patient discharges after hours, please call [phone].   Larry Sierras 03/30/2017, 3:12 PM

## 2017-03-30 NOTE — Progress Notes (Signed)
After bedside I&D the patient has not had significant improvement. Continues to have leukocytosis and significant pain. It appears further debridement is necessary and likely amputation of the second toe. I have asked my colleague, Dr. Sharol Given, to evaluate her foot. She will be transferred to [facility] for evaluation by Dr. Sharol Given. With possible surgery tomorrow. Will be NPO after midnight.  Shona Needles, MD [facility] [phone] (phone)

## 2017-03-30 NOTE — Progress Notes (Signed)
Report called to Gerald Stabs, RN at [facility] @ [phone], patient wanted to eat dinner before transferring out, [facility] called for transport

## 2017-03-30 NOTE — Progress Notes (Signed)
PROGRESS NOTE    [name] [name]  RSW:[id number] DOB: 03/29/1976 DOA: 03/24/2017 PCP: Colbert Ewing, MD   Brief Narrative: 40 y.o. femalewith medical history significant ofnecrotizing fasciitis of the groin,diabetic neuropathy, T2DM, HTN, OSA on CPAP, CKD stage 2-3and Obesitychronic back pain and bipolar disorder; admitted secondary to severe sepsis secondary to left lower extremity cellulitis.    Assessment & Plan:   Principal Problem:   Adjustment disorder with depressed mood Active Problems:   Hyperlipidemia   Essential hypertension   Diabetic peripheral neuropathy (HCC)   Uncontrolled type 2 diabetes mellitus (HCC)   OSA on CPAP   CKD (chronic kidney disease) stage 2, GFR 60-89 ml/min   Severe sepsis (HCC)   Hyponatremia   Abnormal ECG   Sepsis ([place])   Diabetic foot ulcer with surrounding cellulitis/ sepsis  Sepsis improved.  MRI does not show any bone involvement.  Orthopedics consulted for recommendations. Plan for debridement in am by Dr Sharol Given at [facility].  Wound care.  Currently on IV antibiotics.  Will need diuresis to decrease the swelling in the leg.  Monitor renal parameters on IV lasix.     Acute on CKD STAGE 3: Resolved. Probably ATN vs pre renal.  But now since her leg is swollen. Will start diuresis with IV lasix.  Recheck creatinine in am.   Diabetes mellitus:  CBG (last 3)  Recent Labs    03/30/17 0003 03/30/17 0418 03/30/17 0740  GLUCAP 231* 170* 167*   Resume SSI.    OSA:  Resume CPAP at night.    Morbid obesity:  Low calorie diet and dietary consult.    Chronic diastolic heart failure:  Appears compensated.     Hypertension;  Well controlled.     Depression:  Resume home meds.     DVT prophylaxis: SCD'S Code Status: full code Family Communication: none at bedside Disposition Plan: pending further eval   Consultants:   Orthopedics.    Procedures: I&D   Antimicrobials: cefepime, flagyl and  doxycyline from admission.    Subjective: Pain better. Today.  Objective: Vitals:   03/29/17 1319 03/29/17 2031 03/30/17 0421 03/30/17 1303  BP: 116/70 (!) 161/92 118/65 114/67  Pulse: 91 (!) 108 95 96  Resp:  (!) 22 20   Temp: (!) 97.4 F (36.3 C) 100.1 F (37.8 C) 99 F (37.2 C)   TempSrc: Oral Oral Oral   SpO2: 98% 95% 96% 99%  Weight:      Height:        Intake/Output Summary (Last 24 hours) at 03/30/2017 1355 Last data filed at 03/30/2017 0557 Gross per 24 hour  Intake 820 ml  Output 1550 ml  Net -730 ml   Filed Weights   03/26/17 0350 03/27/17 0830 03/29/17 0010  Weight: (!) 157.8 kg (347 lb 14.2 oz) (!) 158.9 kg (350 lb 5 oz) (!) 159.6 kg (351 lb 13.7 oz)    Examination:  General exam: Appears calm and comfortable  Respiratory system: Clear to auscultation. Respiratory effort normal. No wheezing or rhonchi.  Cardiovascular system: S1 & S2 heard, RRR. No JVD, murmurs, No pedal edema. Gastrointestinal system: Abdomen is soft non tender non distended bowel sounds heard.  Central nervous system: Alert and oriented, non focal.  Extremities: lower extremity on the left with a 5 cm ulcer on the dorsum of the foot. Bilateral swelling 3+, more on the left compared to right.  Skin: No rashes, lesions or ulcers Psychiatry: Judgement and insight appear normal. Mood & affect appropriate.  Data Reviewed: I have personally reviewed following labs and imaging studies  CBC: Recent Labs  Lab 03/24/17 1904  03/25/17 0445 03/26/17 0320 03/27/17 0435 03/28/17 0318 03/29/17 0506  WBC 19.5*  --  14.5* 14.7* 12.2* 14.5* 13.7*  NEUTROABS 16.1*  --   --   --   --   --   --   HGB 9.6*   < > 8.5* 8.4* 10.6* 8.1* 8.4*  HCT 29.4*   < > 25.4* 26.4* 32.1* 25.1* 25.9*  MCV 80.8  --  82.2 81.5 80.0 80.2 80.2  PLT 236  --  153 212 220 221 240   < > = values in this interval not displayed.   Basic Metabolic Panel: Recent Labs  Lab 03/25/17 0913 03/26/17 0320 03/27/17 0435  03/28/17 0318 03/29/17 0506 03/30/17 0742  NA 135 137 136 135 134* 134*  K 4.6 4.8 4.9 4.4 4.8 5.3*  CL 104 106 106 106 106 106  CO2 21* 21* 20* 19* 18* 20*  GLUCOSE 161* 207* 139* 158* 135* 157*  BUN 39* 35* 25* 21* 20 21*  CREATININE 1.70* 1.48* 1.15* 1.02* 0.89 0.88  CALCIUM 8.0* 8.4* 9.1 8.8* 8.8* 8.9  MG 2.4  --   --   --   --   --   PHOS 3.7  --   --   --   --   --    GFR: Estimated Creatinine Clearance: 140.7 mL/min (by C-G formula based on SCr of 0.88 mg/dL). Liver Function Tests: Recent Labs  Lab 03/24/17 1904 03/25/17 0913  AST 19 23  ALT 17 18  ALKPHOS 118 105  BILITOT 0.8 0.8  PROT 7.8 7.0  ALBUMIN 2.7* 2.3*   No results for input(s): LIPASE, AMYLASE in the last 168 hours. No results for input(s): AMMONIA in the last 168 hours. Coagulation Profile: Recent Labs  Lab 03/24/17 2324  INR 1.37   Cardiac Enzymes: Recent Labs  Lab 03/24/17 2324 03/25/17 0913 03/25/17 1900  CKTOTAL 85  --   --   TROPONINI <0.03 <0.03 0.07*   BNP (last 3 results) No results for input(s): PROBNP in the last 8760 hours. HbA1C: No results for input(s): HGBA1C in the last 72 hours. CBG: Recent Labs  Lab 03/29/17 1729 03/29/17 2026 03/30/17 0003 03/30/17 0418 03/30/17 0740  GLUCAP 196* 186* 231* 170* 167*   Lipid Profile: No results for input(s): CHOL, HDL, LDLCALC, TRIG, CHOLHDL, LDLDIRECT in the last 72 hours. Thyroid Function Tests: No results for input(s): TSH, T4TOTAL, FREET4, T3FREE, THYROIDAB in the last 72 hours. Anemia Panel: Recent Labs    03/29/17 0506 03/29/17 1052  VITAMINB12  --  1,098*  FOLATE  --  12.8  FERRITIN  --  349*  TIBC  --  104*  IRON  --  12*  RETICCTPCT 0.5  --    Sepsis Labs: Recent Labs  Lab 03/24/17 2030 03/24/17 2324 03/25/17 0416 03/25/17 0913 03/26/17 0320 03/27/17 0435  PROCALCITON  --  7.64  --  7.64 7.29  --   LATICACIDVEN 2.21* 0.8 1.1  --   --  1.3    Recent Results (from the past 240 hour(s))  Blood Culture  (routine x 2)     Status: None   Collection Time: 03/24/17  6:28 PM  Result Value Ref Range Status   Specimen Description BLOOD RIGHT ANTECUBITAL  Final   Special Requests IN PEDIATRIC BOTTLE Blood Culture adequate volume  Final   Culture   Final  NO GROWTH 5 DAYS Performed at [facility], [place] [address], [place], [place] [address]    Report Status 03/29/2017 FINAL  Final  Blood Culture (routine x 2)     Status: None   Collection Time: 03/24/17  7:04 PM  Result Value Ref Range Status   Specimen Description BLOOD LEFT ANTECUBITAL  Final   Special Requests   Final    BOTTLES DRAWN AEROBIC AND ANAEROBIC Blood Culture results may not be optimal due to an inadequate volume of blood received in culture bottles   Culture   Final    NO GROWTH 5 DAYS Performed at [facility], [place] [address] [place], [place] [address]    Report Status 03/29/2017 FINAL  Final  MRSA PCR Screening     Status: Abnormal   Collection Time: 03/25/17  3:53 AM  Result Value Ref Range Status   MRSA by PCR POSITIVE (A) NEGATIVE Final    Comment:        The GeneXpert MRSA Assay (FDA approved for NASAL specimens only), is one component of a comprehensive MRSA colonization surveillance program. It is not intended to diagnose MRSA infection nor to guide or monitor treatment for MRSA infections. RESULT CALLED TO, READ BACK BY AND VERIFIED WITH: FRANKLIN,S AT 1055 ON [id number] BY HOOKER,B          Radiology Studies: No results found.      Scheduled Meds: . acetaminophen  1,000 mg Oral Once  . amitriptyline  50 mg Oral QHS  . atorvastatin  20 mg Oral q1800  . furosemide  20 mg Intravenous Daily  . insulin aspart  0-9 Units Subcutaneous Q4H  . insulin glargine  40 Units Subcutaneous QHS  . prednisoLONE acetate  1 drop Right Eye QID  . pregabalin  100 mg Oral BID   Continuous Infusions: . ceFEPime (MAXIPIME) IV Stopped (03/30/17 1309)  . doxycycline (VIBRAMYCIN) IV Stopped (03/30/17  1023)  . metronidazole 500 mg (03/30/17 1320)     LOS: 6 days    Time spent: 35 minutes.     Hosie Poisson, MD [facility] Pager [phone]  If 7PM-7AM, please contact night-coverage [email or web address] Password [email or web address] 03/30/2017, 1:55 PM

## 2017-03-30 NOTE — Clinical Social Work Note (Signed)
Clinical Social Work Assessment  Patient Details  Name: [name] [name] MRN: [id number] Date of Birth: December 12, 1975  Date of referral:  03/30/17               Reason for consult:  Discharge Planning                Permission sought to share information with:  Case Manager Permission granted to share information::  Yes, Verbal Permission Granted  Name::        Agency::     Relationship::     Contact Information:     Housing/Transportation Living arrangements for the past 2 months:  Single Family Home Source of Information:  Patient Patient Interpreter Needed:  None Criminal Activity/Legal Involvement Pertinent to Current Situation/Hospitalization:  No - Comment as needed Significant Relationships:  Parents, Other Family Members Lives with:  Parents, Other (Comment)(Other Family Members) Do you feel safe going back to the place where you live?  Yes Need for family participation in patient care:  No (Coment)  Care giving concerns:  Patient from home with mom, uncle and nephew. Patient reported that prior to hospitalization she was independent with ADLs. PT recommending SNF for ST rehab.    Social Worker assessment / plan:  CSW spoke with patient at bedside regarding discharge plans and PT recommendation for SNF for ST rehab. Patient reported that she is not interested in SNF and prefers to go home with home health services. Patient reported that she resides in a one level home and that she is able to get around her home with the use of a rolling walker. Patient reported that she has been receiving PT at an outpatient facility. Patient reported that she feels safe going home. CSW agreed to inform patient's RNCM.  CSW notified RNCM about patient's discharge plans, RNCM following. CSW signing off, no other needs identified.   Employment status:  Unemployed Forensic scientist:  Information systems manager, Medicaid [place] PT Recommendations:  [facility] / Referral to  community resources:  Other (Comment Required)(RNCM referral for home health services)  Patient/Family's Response to care:  Patient appreciative of CSW assistance with discharge planing.   Patient/Family's Understanding of and Emotional Response to Diagnosis, Current Treatment, and Prognosis:  Patient verbalized understanding of diagnosis and current treatment. CSW and patient discussed her feelings about her medical condition. Patient reported that she has a lot of emotions, initially she was sad, then she felt upset and now she is just tired of being sick. CSW validated patient's feelings. Patient reported that this is "not my life" and that she is fighting to get better. CSW positively affirmed patient's will power and determination to fight her illness. Patient reported that she is willing to do what's necessary to get her self back. CSW provided emotional support and encouraged patient to keep fighting.   Emotional Assessment Appearance:  Appears stated age Attitude/Demeanor/Rapport:  Other(Cooperative) Affect (typically observed):  Hopeful Orientation:  Oriented to Self, Oriented to Place, Oriented to  Time, Oriented to Situation Alcohol / Substance use:  Not Applicable Psych involvement (Current and /or in the community):  Yes (Comment)(Psych consulted and cleared patient)  Discharge Needs  Concerns to be addressed:  No discharge needs identified Readmission within the last 30 days:  No Current discharge risk:  Physical Impairment Barriers to Discharge:  Continued Medical Work up   [employer], LCSW 03/30/2017, 3:54 PM

## 2017-03-30 NOTE — Consult Note (Signed)
ORTHOPAEDIC CONSULTATION  REQUESTING PHYSICIAN: Hosie Poisson, MD  Chief Complaint: Swelling and ulceration left foot.  HPI: [name] [name] is a 41 y.o. female who presents with patient is a 41 year old woman with peripheral vascular disease venous insufficiency type 2 diabetes with an ulceration of the base of the second toe left foot.  Patient states this is been present for several weeks.  Past Medical History:  Diagnosis Date  . Abnormal Pap smear of cervix 2009  . Abscess    history of multiple abscesses  . Anemia of chronic disease 2002  . Anxiety   . Bipolar disorder ([place])   . Cellulitis 05/21/2014   right eye  . Chronic bronchitis ([place])    "get it q yr" (05/13/2013)  . Chronic pain   . Depression   . Edema of lower extremity   . Endocarditis 2002   subacute bacterial endocarditis.   . Family history of anesthesia complication    "my mom has a hard time coming out from under"  . GERD (gastroesophageal reflux disease)   . Heart murmur   . History of blood transfusion    "just low blood count" (05/13/2013)  . Hyperlipidemia   . Hypertension   . Hypothyroidism   . Hypothyroidism, adult 03/21/2014  . Obesity   . OSA on CPAP   . Peripheral neuropathy   . Type II diabetes mellitus ([place])    Past Surgical History:  Procedure Laterality Date  . INCISION AND DRAINAGE ABSCESS     multiple I&Ds  . TONSILLECTOMY  1994   Social History   Socioeconomic History  . Marital status: Single    Spouse name: None  . Number of children: 0  . Years of education: 54  . Highest education level: None  Social Needs  . Financial resource strain: None  . Food insecurity - worry: None  . Food insecurity - inability: None  . Transportation needs - medical: None  . Transportation needs - non-medical: None  Occupational History  . Occupation: disability  Tobacco Use  . Smoking status: Current Every Day Smoker    Packs/day: 0.50    Years: 6.00    Pack years: 3.00    Types: Cigarettes  . Smokeless tobacco: Never Used  . Tobacco comment: Cutting back  Substance and Sexual Activity  . Alcohol use: Yes    Alcohol/week: 0.0 oz    Comment: Socially.  . Drug use: No  . Sexual activity: Yes  Other Topics Concern  . None  Social History Narrative   Lives with mother currently, goddaughter and uncle in a one story home.     On disability for diabetes, neuropathy, sleep apnea etc.  Disability since 2014.    Education: 11th grade.    Family History  Problem Relation Age of Onset  . Heart failure Mother   . Diabetes Mother   . Kidney disease Mother   . Kidney disease Father   . Diabetes Father   . Diabetes Paternal Grandmother   . Heart failure Paternal Grandmother   . Other Other    - negative except otherwise stated in the family history section Allergies  Allergen Reactions  . Vancomycin Other (See Comments)    Acute renal failure suspected secondary to vanco   Prior to Admission medications   Medication Sig Start Date End Date Taking? Authorizing Provider  amitriptyline (ELAVIL) 50 MG tablet Take 1 tablet (50 mg total) by mouth at bedtime. 02/09/17  Yes Katherine Roan, MD  atorvastatin (LIPITOR) 20 MG tablet TAKE 1 TABLET BY MOUTH EVERY DAY 02/27/17  Yes Bartholomew Crews, MD  cyclobenzaprine (FLEXERIL) 10 MG tablet Take 1 tablet (10 mg total) by mouth every 12 (twelve) hours as needed for muscle spasms. 03/20/17  Yes Oval Linsey, MD  furosemide (LASIX) 20 MG tablet Take 2 tablets (40 mg total) by mouth daily as needed for edema. 02/09/17  Yes Katherine Roan, MD  insulin glargine (LANTUS) 100 UNIT/ML injection Inject 44 units below the skin at bedtime. Diagnosis code:  E11.65, Z79.4 02/01/17  Yes Aldine Contes, MD  INVOKANA 300 MG TABS tablet Take 1 tablet by mouth daily before breakfast 03/07/17  Yes Elayne Snare, MD  liraglutide (VICTOZA) 18 MG/3ML SOPN Inject 0.3 mLs (1.8 mg total) into the skin daily. 09/09/16  Yes Milagros Loll, MD  lisinopril (PRINIVIL,ZESTRIL) 20 MG tablet TAKE 1 TABLET BY MOUTH EVERY DAY 03/23/17  Yes Axel Filler, MD  LYRICA 100 MG capsule TAKE 1 CAPSULE BY MOUTH 2 TIMES DAILY 02/27/17  Yes Patel, Donika K, DO  NOVOLOG FLEXPEN 100 UNIT/ML FlexPen Inject 14 units below the skin 3 times daily with meals 03/07/17  Yes Elayne Snare, MD  prednisoLONE acetate (PRED FORTE) 1 % ophthalmic suspension Place 1 drop into the right eye 4 (four) times daily. 03/15/17 03/15/18 Yes [name], MD  VOLTAREN 1 % GEL Apply 4 grams to affected area (topically) 4 times daily 03/07/17  Yes Bartholomew Crews, MD  ACCU-CHEK FASTCLIX LANCETS MISC Check blood sugar 3 times a day before meals 08/20/15   Milagros Loll, MD  Blood Glucose Monitoring Suppl (ACCU-CHEK AVIVA PLUS) w/Device KIT 1 each by Does not apply route 4 (four) times daily. 11/24/16   Elayne Snare, MD  glucose blood (ACCU-CHEK AVIVA PLUS) test strip Check blood sugar 4 times a day before meals 01/31/17   Elayne Snare, MD  Insulin Pen Needle (B-D UF III MINI PEN NEEDLES) 31G X 5 MM MISC Use 3 per day to inject Insulin 01/03/17   Bartholomew Crews, MD  Insulin Syringe-Needle U-100 31G X 15/64" 0.5 ML MISC Use to inject insulin as isntructed 02/09/17   Katherine Roan, MD   No results found. - pertinent xrays, CT, MRI studies were reviewed and independently interpreted  Positive ROS: All other systems have been reviewed and were otherwise negative with the exception of those mentioned in the HPI and as above.  Physical Exam: General: Alert, no acute distress Psychiatric: Patient is competent for consent with normal mood and affect Lymphatic: No axillary or cervical lymphadenopathy Cardiovascular: No pedal edema Respiratory: No cyanosis, no use of accessory musculature GI: No organomegaly, abdomen is soft and non-tender  Skin: Examination patient has an ischemic ulcer approximately 2 cm in diameter over the second toe MTP joint left  foot.   Neurologic: Patient does not have protective sensation bilateral lower extremities.   MUSCULOSKELETAL:  Examination patient has a strong dorsalis pedis pulse.  She has massive venous stasis swelling to the left lower extremity and left foot.  There is pitting edema up to the tibial tubercle.  Review of the CT scan shows no evidence of osteomyelitis in the left foot review of the MRI scan left leg shows no osteomyelitis no abscess.  Radiographs shows no bony abnormalities.  The ischemic ulcer is not full-thickness.  There is no drainage.  There is no fluctuance no crepitation no signs of necrotizing fasciitis.  Assessment: Assessment: Diabetic insensate neuropathy with venous  insufficiency with swelling and ulceration left lower extremity.  Plan: Plan: Patient will need a silver alginate dressing over the wound.  She will need a 4 layer Profore wrap from her metatarsal heads to the tibial tubercle.  Patient needs to keep her foot elevated level with her heart.  This was discussed with the patient.  Continue short course of IV antibiotics followed by oral antibiotics and I can follow-up in the office next week.  Thank you for the consult and the opportunity to see Ms. [place], MD [facility] [phone] 6:17 PM

## 2017-03-30 NOTE — Care Management Note (Signed)
Case Management Note  Patient Details  Name: ANGINETTE ESPEJO MRN: [id number] Date of Birth: Jan 08, 1976  Subjective/Objective: Patient declines SNF-patient has used [facility] in past;has done iv abx. Patient chose to use [facility] again for HHC-recc HHRN/PT/OT/aide/SW-rep Santiago Glad aware & following. [facility] rep Pam also following if long term iv abx needed. Await ID recc. Also dme needed-wide rw, tub bench.                 Action/Plan:d/c plan home w/HHC/DME.   Expected Discharge Date:  (unknown)               Expected Discharge Plan:  [place]  In-House Referral:  Clinical Social Work  Discharge planning Services  CM Consult  Post Acute Care Choice:    Choice offered to:  Patient  DME Arranged:    DME Agency:     HH Arranged:  RN, PT, OT, Nurse's Aide, Social Work [employer] Agency:  [place]  Status of Service:  In process, will continue to follow  If discussed at Long Length of Stay Meetings, dates discussed:    Additional Comments:  Dessa Phi, RN 03/30/2017, 1:40 PM

## 2017-03-31 ENCOUNTER — Encounter: Payer: Self-pay | Admitting: Physical Therapy

## 2017-03-31 LAB — GLUCOSE, CAPILLARY
GLUCOSE-CAPILLARY: 225 mg/dL — AB (ref 65–99)
Glucose-Capillary: 179 mg/dL — ABNORMAL HIGH (ref 65–99)
Glucose-Capillary: 146 mg/dL — ABNORMAL HIGH (ref 65–99)
Glucose-Capillary: 159 mg/dL — ABNORMAL HIGH (ref 65–99)
Glucose-Capillary: 200 mg/dL — ABNORMAL HIGH (ref 65–99)
Glucose-Capillary: 259 mg/dL — ABNORMAL HIGH (ref 65–99)

## 2017-03-31 LAB — BASIC METABOLIC PANEL
ANION GAP: 5 (ref 5–15)
BUN: 22 mg/dL — ABNORMAL HIGH (ref 6–20)
CHLORIDE: 109 mmol/L (ref 101–111)
CO2: 23 mmol/L (ref 22–32)
Calcium: 9 mg/dL (ref 8.9–10.3)
Creatinine, Ser: 0.9 mg/dL (ref 0.44–1.00)
Glucose, Bld: 173 mg/dL — ABNORMAL HIGH (ref 65–99)
POTASSIUM: 5.2 mmol/L — AB (ref 3.5–5.1)
SODIUM: 137 mmol/L (ref 135–145)

## 2017-03-31 LAB — URINE CULTURE: Culture: <10000 — AB

## 2017-03-31 MED ORDER — FUROSEMIDE 10 MG/ML IJ SOLN
40.0000 mg | Freq: Every day | INTRAMUSCULAR | Status: DC
  Administered 2017-03-31 – 2017-04-01 (×2): 40 mg via INTRAVENOUS
  Filled 2017-03-31 (×2): qty 4

## 2017-03-31 MED ORDER — MUPIROCIN 2 % EX OINT
1.0000 "application " | TOPICAL_OINTMENT | Freq: Two times a day (BID) | CUTANEOUS | Status: DC
  Administered 2017-03-31 – 2017-04-02 (×4): 1 via NASAL
  Filled 2017-03-31: qty 22

## 2017-03-31 NOTE — Consult Note (Addendum)
[place] Nurse wound consult note Reason for Consult: profore application to left lower leg with topical care to ulceration Wound type:neuropathic, venous insufficiency Pressure Injury POA: NA Measurement:4cm x 2.8 cm with indeterminate depth  Wound bed:100% yellow slough Drainage (amount, consistency, odor) moderate yellow exudate Periwound: pink denuded skin Dressing procedure/placement/frequency: Dr. Doreatha Martin and Dr. Sharol Given already consulted and POC was ordered for Profore with Silver Alginate dressing over wound. This was applied after cleansing wound with saline. Pt tolerated fair, extra Profore kit and Aquacel Ag+ left in room. Plan is for pt to see Dr. Sharol Given next week in his office. We will follow this patient and remain available to this patient, nursing, and the medical and surgical teams. Will change Profore on Tuesdays and Fridays while in house.   Fara Olden, RN-C, WTA-C, [place] Wound Treatment Associate Ostomy Care Associate

## 2017-03-31 NOTE — Progress Notes (Addendum)
Physical Therapy Treatment Patient Details Name: [name] [name] MRN: [id number] DOB: 1975/05/28 Today's Date: 03/31/2017    History of Present Illness 41 y.o. femalewith medical history significant ofnecrotizing fasciitis of the groin,diabetic neuropathy, T2DM, HTN, OSA on CPAP, CKD stage 2-3and Obesitychronic back pain and bipolar disorder; admitted secondary to severe sepsis secondary to left lower extremity cellulitis d/t L foot second  toe ulcer    PT Comments    Pt will benefit from continued PT in acute setting; will likely need SNF post acute depending on progress and length of stay;  If progressing well may be able to D/C home with HHPT and HHRN for dressing changes  Follow Up Recommendations  SNF     Equipment Recommendations  None recommended by PT(wide RW delivered to room)    Recommendations for Other Services       Precautions / Restrictions Precautions Precautions: Fall Precaution Comments: left foot wound Restrictions Weight Bearing Restrictions: No Other Position/Activity Restrictions: encouraged heel wt bearing on L--? if pt would benefit from [place]    Mobility  Bed Mobility Overal bed mobility: Needs Assistance Bed Mobility: Supine to Sit     Supine to sit: Modified independent (Device/Increase time)     General bed mobility comments: incr time, no physical assist  Transfers Overall transfer level: Needs assistance Equipment used: Rolling walker (2 wheeled) Transfers: Sit to/from Stand Sit to Stand: Min guard;From elevated surface         General transfer comment: cues for  hand placement and decr wt on LLE (for pain control)  Ambulation/Gait Ambulation/Gait assistance: Min guard Ambulation Distance (Feet): 25 Feet Assistive device: Rolling walker (2 wheeled) Gait Pattern/deviations: Step-to pattern;Antalgic     General Gait Details: verbal cues for sequence, use of UEs to allow decr WBing on LLE   Stairs             Wheelchair Mobility    Modified Rankin (Stroke Patients Only)       Balance                                            Cognition Arousal/Alertness: Awake/alert Behavior During Therapy: WFL for tasks assessed/performed Overall Cognitive Status: Within Functional Limits for tasks assessed                                        Exercises General Exercises - Lower Extremity Ankle Circles/Pumps: Limitations Ankle Circles/Pumps Limitations: encouraged pt to perform after tpain meds/too painful currently    General Comments        Pertinent Vitals/Pain Pain Assessment: Faces Faces Pain Scale: Hurts little more Pain Location: left foot Pain Descriptors / Indicators: Discomfort;Grimacing Pain Intervention(s): Monitored during session    Home Living                      Prior Function            PT Goals (current goals can now be found in the care plan section) Acute Rehab PT Goals Patient Stated Goal: to go home, take care of herself PT Goal Formulation: With patient Time For Goal Achievement: 04/12/17 Potential to Achieve Goals: Fair Progress towards PT goals: Progressing toward goals    Frequency    Min 3X/week  PT Plan Current plan remains appropriate    Co-evaluation              AM-PAC PT "6 Clicks" Daily Activity  Outcome Measure  Difficulty turning over in bed (including adjusting bedclothes, sheets and blankets)?: A Little Difficulty moving from lying on back to sitting on the side of the bed? : A Little Difficulty sitting down on and standing up from a chair with arms (e.g., wheelchair, bedside commode, etc,.)?: A Little Help needed moving to and from a bed to chair (including a wheelchair)?: A Little Help needed walking in hospital room?: A Little Help needed climbing 3-5 steps with a railing? : A Lot 6 Click Score: 17    End of Session Equipment Utilized During Treatment: Gait  belt Activity Tolerance: Patient limited by pain Patient left: in chair;with call bell/phone within reach Nurse Communication: Mobility status PT Visit Diagnosis: Pain;Unsteadiness on feet (R26.81) Pain - Right/Left: Left Pain - part of body: Ankle and joints of foot     Time: 1040-1108 PT Time Calculation (min) (ACUTE ONLY): 28 min  Charges:  $Gait Training: 23-37 mins                    G CodesKenyon Ana, PT Pager: [phone] 03/31/2017    Kenyon Ana 03/31/2017, 11:31 AM

## 2017-03-31 NOTE — Progress Notes (Signed)
PROGRESS NOTE    [name] [name]  ZLD:[id number] DOB: 1976-03-29 DOA: 03/24/2017 PCP: Colbert Ewing, MD   Brief Narrative: 41 y.o. femalewith medical history significant ofnecrotizing fasciitis of the groin,diabetic neuropathy, T2DM, HTN, OSA on CPAP, CKD stage 2-3and Obesitychronic back pain and bipolar disorder; admitted secondary to severe sepsis secondary to left lower extremity cellulitis.    Assessment & Plan:   Principal Problem:   Adjustment disorder with depressed mood Active Problems:   Hyperlipidemia   Essential hypertension   Diabetic peripheral neuropathy (HCC)   Uncontrolled type 2 diabetes mellitus (HCC)   OSA on CPAP   CKD (chronic kidney disease) stage 2, GFR 60-89 ml/min   Severe sepsis (HCC)   Hyponatremia   Abnormal ECG   Sepsis ([place])   Diabetic foot ulcer with surrounding cellulitis/ sepsis  Sepsis improved.  MRI does not show any bone involvement.  Orthopedics consulted for recommendations. recomemnded to continue with IV antibiotics and outpatient follow up with Dr Sharol Given at the office.  Wound care.  Currently on IV antibiotics. Started IV lasix  to decrease the swelling in the leg.  Monitor renal parameters on IV lasix.     Acute on CKD STAGE 3: Resolved. Probably ATN vs pre renal.  But now since her leg is swollen. Will start diuresis with IV lasix.  Recheck creatinine in am is normal.   Diabetes mellitus:  CBG (last 3)  Recent Labs    03/31/17 0448 03/31/17 0747 03/31/17 1205  GLUCAP 179* 146* 159*   Resume SSI.    OSA:  Resume CPAP at night.    Morbid obesity:  Low calorie diet and dietary consult.    Chronic diastolic heart failure:  Appears compensated.     Hypertension;  Well controlled.     Depression:  Resume home meds.     DVT prophylaxis: SCD'S Code Status: full code Family Communication: none at bedside Disposition Plan: pending further eval   Consultants:   Orthopedics.    Procedures:  I&D   Antimicrobials: cefepime, flagyl and doxycyline from admission.    Subjective: Pain better. Today.  One bm today.  Objective: Vitals:   03/30/17 1303 03/30/17 2100 03/31/17 0450 03/31/17 1439  BP: 114/67 (!) 160/84 (!) 153/93 (!) 141/74  Pulse: 96 98 94 (!) 112  Resp:  (!) 22 (!) 22   Temp:  99 F (37.2 C) 98.5 F (36.9 C) 98.4 F (36.9 C)  TempSrc:  Oral Oral Oral  SpO2: 99% 98% 98% 100%  Weight:      Height:        Intake/Output Summary (Last 24 hours) at 03/31/2017 1745 Last data filed at 03/31/2017 1500 Gross per 24 hour  Intake 1550 ml  Output 2400 ml  Net -850 ml   Filed Weights   03/26/17 0350 03/27/17 0830 03/29/17 0010  Weight: (!) 157.8 kg (347 lb 14.2 oz) (!) 158.9 kg (350 lb 5 oz) (!) 159.6 kg (351 lb 13.7 oz)    Examination:  General exam: Appears calm and comfortable not in distress.  Respiratory system: Clear to auscultation. Respiratory effort normal. No wheezing or rhonchi.  Cardiovascular system: S1 & S2 heard, RRR. No JVD, murmurs, No pedal edema. Gastrointestinal system: Abdomen is soft non tender non distended bowel sounds heard.  Central nervous system: Alert and oriented, non focal.  Extremities: lower extremity on the left with a 5 cm ulcer on the dorsum of the foot. Bilateral swelling 3+, more on the left compared to right.  The  erythema and the purulent discharge has improved.  Skin: No rashes, lesions or ulcers Psychiatry:. Mood & affect appropriate.     Data Reviewed: I have personally reviewed following labs and imaging studies  CBC: Recent Labs  Lab 03/24/17 1904  03/25/17 0445 03/26/17 0320 03/27/17 0435 03/28/17 0318 03/29/17 0506  WBC 19.5*  --  14.5* 14.7* 12.2* 14.5* 13.7*  NEUTROABS 16.1*  --   --   --   --   --   --   HGB 9.6*   < > 8.5* 8.4* 10.6* 8.1* 8.4*  HCT 29.4*   < > 25.4* 26.4* 32.1* 25.1* 25.9*  MCV 80.8  --  82.2 81.5 80.0 80.2 80.2  PLT 236  --  153 212 220 221 240   < > = values in this interval  not displayed.   Basic Metabolic Panel: Recent Labs  Lab 03/25/17 0913  03/27/17 0435 03/28/17 0318 03/29/17 0506 03/30/17 0742 03/31/17 0528  NA 135   < > 136 135 134* 134* 137  K 4.6   < > 4.9 4.4 4.8 5.3* 5.2*  CL 104   < > 106 106 106 106 109  CO2 21*   < > 20* 19* 18* 20* 23  GLUCOSE 161*   < > 139* 158* 135* 157* 173*  BUN 39*   < > 25* 21* 20 21* 22*  CREATININE 1.70*   < > 1.15* 1.02* 0.89 0.88 0.90  CALCIUM 8.0*   < > 9.1 8.8* 8.8* 8.9 9.0  MG 2.4  --   --   --   --   --   --   PHOS 3.7  --   --   --   --   --   --    < > = values in this interval not displayed.   GFR: Estimated Creatinine Clearance: 137.6 mL/min (by C-G formula based on SCr of 0.9 mg/dL). Liver Function Tests: Recent Labs  Lab 03/24/17 1904 03/25/17 0913  AST 19 23  ALT 17 18  ALKPHOS 118 105  BILITOT 0.8 0.8  PROT 7.8 7.0  ALBUMIN 2.7* 2.3*   No results for input(s): LIPASE, AMYLASE in the last 168 hours. No results for input(s): AMMONIA in the last 168 hours. Coagulation Profile: Recent Labs  Lab 03/24/17 2324  INR 1.37   Cardiac Enzymes: Recent Labs  Lab 03/24/17 2324 03/25/17 0913 03/25/17 1900  CKTOTAL 85  --   --   TROPONINI <0.03 <0.03 0.07*   BNP (last 3 results) No results for input(s): PROBNP in the last 8760 hours. HbA1C: No results for input(s): HGBA1C in the last 72 hours. CBG: Recent Labs  Lab 03/30/17 1956 03/31/17 0038 03/31/17 0448 03/31/17 0747 03/31/17 1205  GLUCAP 241* 225* 179* 146* 159*   Lipid Profile: No results for input(s): CHOL, HDL, LDLCALC, TRIG, CHOLHDL, LDLDIRECT in the last 72 hours. Thyroid Function Tests: No results for input(s): TSH, T4TOTAL, FREET4, T3FREE, THYROIDAB in the last 72 hours. Anemia Panel: Recent Labs    03/29/17 0506 03/29/17 1052  VITAMINB12  --  1,098*  FOLATE  --  12.8  FERRITIN  --  349*  TIBC  --  104*  IRON  --  12*  RETICCTPCT 0.5  --    Sepsis Labs: Recent Labs  Lab 03/24/17 2030 03/24/17 2324  03/25/17 0416 03/25/17 0913 03/26/17 0320 03/27/17 0435  PROCALCITON  --  7.64  --  7.64 7.29  --   LATICACIDVEN 2.21* 0.8 1.1  --   --  1.3    Recent Results (from the past 240 hour(s))  Blood Culture (routine x 2)     Status: None   Collection Time: 03/24/17  6:28 PM  Result Value Ref Range Status   Specimen Description BLOOD RIGHT ANTECUBITAL  Final   Special Requests IN PEDIATRIC BOTTLE Blood Culture adequate volume  Final   Culture   Final    NO GROWTH 5 DAYS Performed at [facility], [address], [place], [place] [address]    Report Status 03/29/2017 FINAL  Final  Blood Culture (routine x 2)     Status: None   Collection Time: 03/24/17  7:04 PM  Result Value Ref Range Status   Specimen Description BLOOD LEFT ANTECUBITAL  Final   Special Requests   Final    BOTTLES DRAWN AEROBIC AND ANAEROBIC Blood Culture results may not be optimal due to an inadequate volume of blood received in culture bottles   Culture   Final    NO GROWTH 5 DAYS Performed at [facility], [place] [address] [place], [place] [address]    Report Status 03/29/2017 FINAL  Final  MRSA PCR Screening     Status: Abnormal   Collection Time: 03/25/17  3:53 AM  Result Value Ref Range Status   MRSA by PCR POSITIVE (A) NEGATIVE Final    Comment:        The GeneXpert MRSA Assay (FDA approved for NASAL specimens only), is one component of a comprehensive MRSA colonization surveillance program. It is not intended to diagnose MRSA infection nor to guide or monitor treatment for MRSA infections. RESULT CALLED TO, READ BACK BY AND VERIFIED WITH: FRANKLIN,S AT 1055 ON [id number] BY HOOKER,B   Culture, Urine     Status: Abnormal   Collection Time: 03/30/17 12:41 PM  Result Value Ref Range Status   Specimen Description URINE, CLEAN CATCH  Final   Special Requests NONE  Final   Culture (A)  Final    <10,000 COLONIES/mL INSIGNIFICANT GROWTH Performed at [facility], [address]  [place], [place] [address]    Report Status 03/31/2017 FINAL  Final         Radiology Studies: No results found.      Scheduled Meds: . acetaminophen  1,000 mg Oral Once  . amitriptyline  50 mg Oral QHS  . atorvastatin  20 mg Oral q1800  . furosemide  40 mg Intravenous Daily  . insulin aspart  0-9 Units Subcutaneous Q4H  . insulin glargine  40 Units Subcutaneous QHS  . prednisoLONE acetate  1 drop Right Eye QID  . pregabalin  100 mg Oral BID   Continuous Infusions: . ceFEPime (MAXIPIME) IV Stopped (03/31/17 1313)  . doxycycline (VIBRAMYCIN) IV Stopped (03/31/17 1024)  . metronidazole Stopped (03/31/17 1558)     LOS: 7 days    Time spent: 35 minutes.     Hosie Poisson, MD [facility] Pager [phone]  If 7PM-7AM, please contact night-coverage [email or web address] Password [email or web address] 03/31/2017, 5:45 PM

## 2017-03-31 NOTE — Progress Notes (Signed)
Pt refused to wear CPAP.  RT will continue to monitor.

## 2017-04-01 LAB — GLUCOSE, CAPILLARY
GLUCOSE-CAPILLARY: 188 mg/dL — AB (ref 65–99)
GLUCOSE-CAPILLARY: 191 mg/dL — AB (ref 65–99)
Glucose-Capillary: 168 mg/dL — ABNORMAL HIGH (ref 65–99)
Glucose-Capillary: 129 mg/dL — ABNORMAL HIGH (ref 65–99)
Glucose-Capillary: 161 mg/dL — ABNORMAL HIGH (ref 65–99)
Glucose-Capillary: 234 mg/dL — ABNORMAL HIGH (ref 65–99)

## 2017-04-01 LAB — BASIC METABOLIC PANEL
Anion gap: 7 (ref 5–15)
BUN: 19 mg/dL (ref 6–20)
CALCIUM: 8.8 mg/dL — AB (ref 8.9–10.3)
CHLORIDE: 104 mmol/L (ref 101–111)
CO2: 23 mmol/L (ref 22–32)
Creatinine, Ser: 0.89 mg/dL (ref 0.44–1.00)
GFR calc Af Amer: >60 mL/min (ref >60)
GFR calc non Af Amer: >60 mL/min (ref >60)
Glucose, Bld: 156 mg/dL — ABNORMAL HIGH (ref 65–99)
Potassium: 5.5 mmol/L — ABNORMAL HIGH (ref 3.5–5.1)
SODIUM: 134 mmol/L — AB (ref 135–145)

## 2017-04-01 LAB — POTASSIUM: POTASSIUM: 5.4 mmol/L — AB (ref 3.5–5.1)

## 2017-04-01 MED ORDER — SODIUM POLYSTYRENE SULFONATE 15 GM/60ML PO SUSP
30.0000 g | Freq: Once | ORAL | Status: AC
  Administered 2017-04-01: 30 g via ORAL
  Filled 2017-04-01: qty 120

## 2017-04-01 MED ORDER — MUPIROCIN 2 % EX OINT: 1.0000 "application " | TOPICAL_OINTMENT | Freq: Two times a day (BID) | CUTANEOUS | 0 refills | Status: DC

## 2017-04-01 MED ORDER — HYDROCODONE-ACETAMINOPHEN 5-325 MG PO TABS: 1.0000 | ORAL_TABLET | ORAL | 0 refills | Status: DC | PRN

## 2017-04-01 MED ORDER — FUROSEMIDE 20 MG PO TABS: 40.0000 mg | ORAL_TABLET | Freq: Every day | ORAL | 0 refills | Status: DC

## 2017-04-01 NOTE — Progress Notes (Signed)
[facility]  [facility] is providing [place] [facility] services for [name] [name] at DC.  I provided teaching regarding IV ABX administration with pt to support independence at home.  Pt states her aunt will also her at home.  [facility] is prepared for weekend DC if ordered but will  Need OPAT consult by MD for [name] [name] to enter script for MD in [place].  If patient discharges after hours, please call [phone].   Larry Sierras 04/01/2017, 7:43 AM

## 2017-04-01 NOTE — Progress Notes (Signed)
Pt has refused CPAP for the night.  Machine at bedside incase Pt decides to wear.  RT to monitor and assess as needed.

## 2017-04-01 NOTE — Progress Notes (Signed)
PROGRESS NOTE    [name] [name]  CWC:[id number] DOB: February 24, 1976 DOA: 03/24/2017 PCP: Colbert Ewing, MD   Brief Narrative: 41 y.o. femalewith medical history significant ofnecrotizing fasciitis of the groin,diabetic neuropathy, T2DM, HTN, OSA on CPAP, CKD stage 2-3and Obesitychronic back pain and bipolar disorder; admitted secondary to severe sepsis secondary to left lower extremity cellulitis.    Assessment & Plan:   Principal Problem:   Adjustment disorder with depressed mood Active Problems:   Hyperlipidemia   Essential hypertension   Diabetic peripheral neuropathy (HCC)   Uncontrolled type 2 diabetes mellitus (HCC)   OSA on CPAP   CKD (chronic kidney disease) stage 2, GFR 60-89 ml/min   Severe sepsis (HCC)   Hyponatremia   Abnormal ECG   Sepsis ([place])   Diabetic foot ulcer with surrounding cellulitis/ sepsis  Sepsis improved.  MRI does not show any bone involvement.  Orthopedics consulted for recommendations. recomemnded to continue with IV antibiotics and outpatient follow up with Dr Sharol Given at the office.  Wound care.  Currently on IV antibiotics. Started IV lasix  to decrease the swelling in the leg.  Monitor renal parameters on IV lasix.  Creatinine continues to remain stable.    Hyperkalemia: unclear etiology.  She is not on any meds that would increase her potassium  Ordered kayexalate, if potassium level is back to normal, can be discharged home.     Acute on CKD STAGE 3: Resolved. Probably ATN vs pre renal.  Recheck creatinine in am is normal.   Diabetes mellitus:  CBG (last 3)  Recent Labs    04/01/17 0800 04/01/17 1256 04/01/17 1615  GLUCAP 129* 161* 191*   Resume SSI.    OSA:  Resume CPAP at night.    Morbid obesity:  Low calorie diet and dietary consult.    Chronic diastolic heart failure:  Appears compensated.     Hypertension;  Well controlled.     Depression:  Resume home meds.     DVT prophylaxis:  SCD'S Code Status: full code Family Communication: none at bedside Disposition Plan:home when potassium imrpoves.    Consultants:   Orthopedics.    Procedures: I&D   Antimicrobials: cefepime, flagyl and doxycyline from admission.    Subjective: No BM today.  Pain better today. In good spirits.   Objective: Vitals:   03/31/17 1439 03/31/17 2034 04/01/17 0503 04/01/17 1500  BP: (!) 141/74 135/72 (!) 144/87 (!) 148/78  Pulse: (!) 112 98 91 (!) 109  Resp:  20 16 17   Temp: 98.4 F (36.9 C)  97.8 F (36.6 C) 99.1 F (37.3 C)  TempSrc: Oral  Oral Oral  SpO2: 100% 99% 100% 99%  Weight:      Height:        Intake/Output Summary (Last 24 hours) at 04/01/2017 1743 Last data filed at 04/01/2017 1500 Gross per 24 hour  Intake 1250 ml  Output 1175 ml  Net 75 ml   Filed Weights   03/26/17 0350 03/27/17 0830 03/29/17 0010  Weight: (!) 157.8 kg (347 lb 14.2 oz) (!) 158.9 kg (350 lb 5 oz) (!) 159.6 kg (351 lb 13.7 oz)    Examination:  General exam: Appears calm and comfortable not in distress.  Respiratory system: good air entry bilateral. No wheezing or rhonchi.  Cardiovascular system: S1 & S2 heard, RRR. No JVD, murmurs, No pedal edema. Gastrointestinal system: Abdomen is soft non tender non distended bowel sounds heard.  Central nervous system: Alert and oriented, non focal.  Extremities: lower extremity  on the left with a 5 cm ulcer on the dorsum of the foot. Bilateral swelling 2+, more on the left compared to right.  The erythema and the purulent discharge has improved. Tenderness has improved, swelling has improved.  Skin: No rashes, lesions or ulcers Psychiatry:. Mood & affect appropriate.     Data Reviewed: I have personally reviewed following labs and imaging studies  CBC: Recent Labs  Lab 03/26/17 0320 03/27/17 0435 03/28/17 0318 03/29/17 0506  WBC 14.7* 12.2* 14.5* 13.7*  HGB 8.4* 10.6* 8.1* 8.4*  HCT 26.4* 32.1* 25.1* 25.9*  MCV 81.5 80.0 80.2 80.2   PLT 212 220 221 297   Basic Metabolic Panel: Recent Labs  Lab 03/28/17 0318 03/29/17 0506 03/30/17 0742 03/31/17 0528 04/01/17 0535 04/01/17 1436  NA 135 134* 134* 137 134*  --   K 4.4 4.8 5.3* 5.2* 5.5* 5.4*  CL 106 106 106 109 104  --   CO2 19* 18* 20* 23 23  --   GLUCOSE 158* 135* 157* 173* 156*  --   BUN 21* 20 21* 22* 19  --   CREATININE 1.02* 0.89 0.88 0.90 0.89  --   CALCIUM 8.8* 8.8* 8.9 9.0 8.8*  --    GFR: Estimated Creatinine Clearance: 139.1 mL/min (by C-G formula based on SCr of 0.89 mg/dL). Liver Function Tests: No results for input(s): AST, ALT, ALKPHOS, BILITOT, PROT, ALBUMIN in the last 168 hours. No results for input(s): LIPASE, AMYLASE in the last 168 hours. No results for input(s): AMMONIA in the last 168 hours. Coagulation Profile: No results for input(s): INR, PROTIME in the last 168 hours. Cardiac Enzymes: Recent Labs  Lab 03/25/17 1900  TROPONINI 0.07*   BNP (last 3 results) No results for input(s): PROBNP in the last 8760 hours. HbA1C: No results for input(s): HGBA1C in the last 72 hours. CBG: Recent Labs  Lab 04/01/17 0005 04/01/17 0503 04/01/17 0800 04/01/17 1256 04/01/17 1615  GLUCAP 188* 168* 129* 161* 191*   Lipid Profile: No results for input(s): CHOL, HDL, LDLCALC, TRIG, CHOLHDL, LDLDIRECT in the last 72 hours. Thyroid Function Tests: No results for input(s): TSH, T4TOTAL, FREET4, T3FREE, THYROIDAB in the last 72 hours. Anemia Panel: No results for input(s): VITAMINB12, FOLATE, FERRITIN, TIBC, IRON, RETICCTPCT in the last 72 hours. Sepsis Labs: Recent Labs  Lab 03/26/17 0320 03/27/17 0435  PROCALCITON 7.29  --   LATICACIDVEN  --  1.3    Recent Results (from the past 240 hour(s))  Blood Culture (routine x 2)     Status: None   Collection Time: 03/24/17  6:28 PM  Result Value Ref Range Status   Specimen Description BLOOD RIGHT ANTECUBITAL  Final   Special Requests IN PEDIATRIC BOTTLE Blood Culture adequate volume   Final   Culture   Final    NO GROWTH 5 DAYS Performed at [facility], [place] [address], [place], [place] [address]    Report Status 03/29/2017 FINAL  Final  Blood Culture (routine x 2)     Status: None   Collection Time: 03/24/17  7:04 PM  Result Value Ref Range Status   Specimen Description BLOOD LEFT ANTECUBITAL  Final   Special Requests   Final    BOTTLES DRAWN AEROBIC AND ANAEROBIC Blood Culture results may not be optimal due to an inadequate volume of blood received in culture bottles   Culture   Final    NO GROWTH 5 DAYS Performed at [facility], [place],  [place] [address]    Report Status 03/29/2017 FINAL  Final  MRSA PCR Screening     Status: Abnormal   Collection Time: 03/25/17  3:53 AM  Result Value Ref Range Status   MRSA by PCR POSITIVE (A) NEGATIVE Final    Comment:        The GeneXpert MRSA Assay (FDA approved for NASAL specimens only), is one component of a comprehensive MRSA colonization surveillance program. It is not intended to diagnose MRSA infection nor to guide or monitor treatment for MRSA infections. RESULT CALLED TO, READ BACK BY AND VERIFIED WITH: FRANKLIN,S AT 1055 ON [id number] BY HOOKER,B   Culture, Urine     Status: Abnormal   Collection Time: 03/30/17 12:41 PM  Result Value Ref Range Status   Specimen Description URINE, CLEAN CATCH  Final   Special Requests NONE  Final   Culture (A)  Final    <10,000 COLONIES/mL INSIGNIFICANT GROWTH Performed at [facility], [address] [place], [place] [address]    Report Status 03/31/2017 FINAL  Final         Radiology Studies: No results found.      Scheduled Meds: . acetaminophen  1,000 mg Oral Once  . amitriptyline  50 mg Oral QHS  . atorvastatin  20 mg Oral q1800  . furosemide  40 mg Intravenous Daily  . insulin aspart  0-9 Units Subcutaneous Q4H  . insulin glargine  40 Units Subcutaneous QHS  . mupirocin ointment  1 application Nasal BID  .  prednisoLONE acetate  1 drop Right Eye QID  . pregabalin  100 mg Oral BID   Continuous Infusions: . ceFEPime (MAXIPIME) IV Stopped (04/01/17 1411)  . doxycycline (VIBRAMYCIN) IV Stopped (04/01/17 1025)  . metronidazole Stopped (04/01/17 1443)     LOS: 8 days    Time spent: 35 minutes.     Hosie Poisson, MD [facility] Pager [phone]  If 7PM-7AM, please contact night-coverage [email or web address] Password [facility] 04/01/2017, 5:43 PM

## 2017-04-02 DIAGNOSIS — I5030 Unspecified diastolic (congestive) heart failure: Secondary | ICD-10-CM

## 2017-04-02 LAB — BASIC METABOLIC PANEL
ANION GAP: 9 (ref 5–15)
ANION GAP: 8 (ref 5–15)
BUN: 21 mg/dL — ABNORMAL HIGH (ref 6–20)
BUN: 22 mg/dL — AB (ref 6–20)
CHLORIDE: 106 mmol/L (ref 101–111)
CHLORIDE: 102 mmol/L (ref 101–111)
CO2: 21 mmol/L — AB (ref 22–32)
CO2: 25 mmol/L (ref 22–32)
Calcium: 8.8 mg/dL — ABNORMAL LOW (ref 8.9–10.3)
Calcium: 8.9 mg/dL (ref 8.9–10.3)
Creatinine, Ser: 0.93 mg/dL (ref 0.44–1.00)
Creatinine, Ser: 0.89 mg/dL (ref 0.44–1.00)
GFR calc Af Amer: >60 mL/min (ref >60)
GFR calc non Af Amer: >60 mL/min (ref >60)
GFR calc non Af Amer: >60 mL/min (ref >60)
GLUCOSE: 152 mg/dL — AB (ref 65–99)
Glucose, Bld: 176 mg/dL — ABNORMAL HIGH (ref 65–99)
POTASSIUM: 4.8 mmol/L (ref 3.5–5.1)
Potassium: 6 mmol/L — ABNORMAL HIGH (ref 3.5–5.1)
Sodium: 136 mmol/L (ref 135–145)
Sodium: 135 mmol/L (ref 135–145)

## 2017-04-02 LAB — GLUCOSE, CAPILLARY
GLUCOSE-CAPILLARY: 155 mg/dL — AB (ref 65–99)
GLUCOSE-CAPILLARY: 253 mg/dL — AB (ref 65–99)
Glucose-Capillary: 180 mg/dL — ABNORMAL HIGH (ref 65–99)
Glucose-Capillary: 236 mg/dL — ABNORMAL HIGH (ref 65–99)

## 2017-04-02 LAB — NA AND K (SODIUM & POTASSIUM), RAND UR
Potassium Urine: 15 mmol/L
Sodium, Ur: 95 mmol/L

## 2017-04-02 MED ORDER — SODIUM POLYSTYRENE SULFONATE 15 GM/60ML PO SUSP
30.0000 g | Freq: Once | ORAL | Status: DC
  Filled 2017-04-02: qty 120

## 2017-04-02 MED ORDER — SODIUM CHLORIDE 0.9 % IV SOLN
1.0000 g | Freq: Once | INTRAVENOUS | Status: DC
  Filled 2017-04-02: qty 10

## 2017-04-02 MED ORDER — ALBUTEROL SULFATE (2.5 MG/3ML) 0.083% IN NEBU
10.0000 mg | INHALATION_SOLUTION | Freq: Once | RESPIRATORY_TRACT | Status: AC
  Administered 2017-04-02: 10 mg via RESPIRATORY_TRACT
  Filled 2017-04-02: qty 12

## 2017-04-02 MED ORDER — AMOXICILLIN-POT CLAVULANATE 875-125 MG PO TABS
1.0000 | ORAL_TABLET | Freq: Two times a day (BID) | ORAL | Status: DC
  Administered 2017-04-02: 1 via ORAL
  Filled 2017-04-02: qty 1

## 2017-04-02 MED ORDER — DOXYCYCLINE HYCLATE 100 MG PO TABS
100.0000 mg | ORAL_TABLET | Freq: Two times a day (BID) | ORAL | Status: DC
  Administered 2017-04-02: 100 mg via ORAL
  Filled 2017-04-02: qty 1

## 2017-04-02 MED ORDER — DOXYCYCLINE HYCLATE 100 MG PO TABS: 100.0000 mg | ORAL_TABLET | Freq: Two times a day (BID) | ORAL | 0 refills | Status: DC

## 2017-04-02 MED ORDER — DEXTROSE 10 % IV SOLN: Freq: Once | INTRAVENOUS | Status: DC

## 2017-04-02 MED ORDER — INSULIN ASPART 100 UNIT/ML IV SOLN
10.0000 [IU] | Freq: Once | INTRAVENOUS | Status: AC
  Administered 2017-04-02: 10 [IU] via INTRAVENOUS

## 2017-04-02 MED ORDER — DEXTROSE 50 % IV SOLN
1.0000 | Freq: Once | INTRAVENOUS | Status: DC
  Filled 2017-04-02: qty 50

## 2017-04-02 MED ORDER — CALCIUM CHLORIDE 10 % IV SOLN
1.0000 g | Freq: Once | INTRAVENOUS | Status: DC
  Filled 2017-04-02: qty 10

## 2017-04-02 MED ORDER — AMOXICILLIN-POT CLAVULANATE 875-125 MG PO TABS: 1.0000 | ORAL_TABLET | Freq: Two times a day (BID) | ORAL | 0 refills | Status: DC

## 2017-04-02 MED ORDER — FUROSEMIDE 10 MG/ML IJ SOLN
40.0000 mg | Freq: Once | INTRAMUSCULAR | Status: AC
  Administered 2017-04-02: 40 mg via INTRAVENOUS
  Filled 2017-04-02: qty 4

## 2017-04-02 NOTE — Progress Notes (Signed)
Inpatient Diabetes Program Recommendations  AACE/ADA: New Consensus Statement on Inpatient Glycemic Control (2015)  Target Ranges:  Prepandial:   less than 140 mg/dL      Peak postprandial:   less than 180 mg/dL (1-2 hours)      Critically ill patients:  140 - 180 mg/dL   Lab Results  Component Value Date   GLUCAP 253 (H) 04/02/2017   HGBA1C 9.0 (H) 03/25/2017    Review of Glycemic Control  Post-prandials > 180 mg/dL.  Inpatient Diabetes Program Recommendations:   Add Novolog 8 units tidwc for meal coverage insulin.  Continue to follow.  Thank you. Lorenda Peck, RD, LDN, CDE Inpatient Diabetes Coordinator [phone]

## 2017-04-02 NOTE — Progress Notes (Signed)
Per Dr. Karleen Hampshire,  Wait for lab result for potassium before carrying out the STAT order and if levels are okay, hold off on the order.

## 2017-04-02 NOTE — Progress Notes (Signed)
Patient discharged home, discharge instructions given and explained to patient, she verbalized understanding, denies any distress. [facility] for [facility] and home health needs. Right foot dressing clean/dry/intact, changes tuesdays and Fridays and post-of shoes given for protection. Patient accompanied home by family.

## 2017-04-02 NOTE — Progress Notes (Signed)
K level increased to 6.0. Pt has not had a BM this shift, but is on BSC at this time. On-call MD notified of increased level.

## 2017-04-02 NOTE — Care Management Note (Signed)
Case Management Note  Patient Details  Name: [name] [name] MRN: [id number] Date of Birth: 06-13-1975  Subjective/Objective:    CKD, sepsis, HTN, DM                Action/Plan: Discharge Planning: Contacted [facility] to make aware pt will dc home on po abx but [facility] RN still needed for dressing changes. Pt has DME in room, RW and tub bench. Unit RN will check with pt's to see if her pharmacy is open on Sunday. Will need Rx printed to take to retail pharmacy that is open on Sunday.   PCP Colbert Ewing   Expected Discharge Date:  04/02/17               Expected Discharge Plan:  [place]  In-House Referral:  Clinical Social Work  Discharge planning Services  CM Consult  Post Acute Care Choice:    Choice offered to:  Patient  DME Arranged:  Walker rolling, Tub bench DME Agency:  [place] Arranged:  RN, PT, OT, Nurse's Aide, Social Work [employer] Agency:  [place]  Status of Service:  Completed, signed off  If discussed at [employer], dates discussed:    Additional Comments:  Erenest Rasher, RN 04/02/2017, 2:48 PM

## 2017-04-03 ENCOUNTER — Other Ambulatory Visit: Payer: Self-pay

## 2017-04-03 NOTE — Discharge Summary (Signed)
Physician Discharge Summary  [name] [name] KHT:[id number] DOB: [birth year] DOA: 03/24/2017  PCP: Colbert Ewing, MD  Admit date: 03/24/2017 Discharge date: 04/02/2017  Admitted From: Home.  Disposition:  Home.   Recommendations for Outpatient Follow-up:  1. Follow up with PCP in 1-2 weeks 2. Please obtain BMP/CBC in one week 3. Please follow up with Dr Sharol Given as recommended.  4. Please stop the lisinopril as you had hyperkalemia and renal failure recently.   Home Health:yes   Discharge Condition:stable.  CODE STATUS: full code.  Diet recommendation: Heart Healthy / carb modified bed .  Brief/Interim Summary: 41 y.o. femalewith medical history significant ofnecrotizing fasciitis of the groin,diabetic neuropathy, T2DM, HTN, OSA on CPAP, CKD stage 2-3and Obesitychronic back pain and bipolar disorder; admitted secondary to severe sepsis secondary to left lower extremity cellulitis.   Discharge Diagnoses:  Principal Problem:   Adjustment disorder with depressed mood Active Problems:   Hyperlipidemia   Essential hypertension   Diabetic peripheral neuropathy (HCC)   Uncontrolled type 2 diabetes mellitus (HCC)   OSA on CPAP   CKD (chronic kidney disease) stage 2, GFR 60-89 ml/min   Severe sepsis (HCC)   Hyponatremia   Abnormal ECG   Sepsis ([place])  Diabetic foot ulcer with surrounding cellulitis/ sepsis  Sepsis improved.  MRI does not show any bone involvement.  Orthopedics consulted for recommendations. recommended  outpatient follow up with Dr Sharol Given at the office.  Wound care.  She completed about 8 days of IV antibiotics and transitioned to oral antibiotics to complete the course.    Hyperkalemia: unclear etiology.  She is not on any meds that would increase her potassium  Ordered kayexalate, if potassium level is back to normal, can be discharged home.     Acute on CKD STAGE 3: Resolved. Probably ATN vs pre renal.  Recheck creatinine in am is normal.    Diabetes mellitus:  CBG (last 3)  RecentLabs(last2labs)       Recent Labs    04/01/17 0800 04/01/17 1256 04/01/17 1615  GLUCAP 129* 161* 191*     Resume home meds.    OSA:  Resume CPAP at night.    Morbid obesity:  Low calorie diet and dietary consult.    Chronic diastolic heart failure:  Appears compensated.     Hypertension;  Well controlled.  holding lisinopril for hyperkalemia and acute renal failure.    Depression:  Resume home meds     Discharge Instructions  Discharge Instructions    Diet - low sodium heart healthy   Complete by:  As directed    Discharge instructions   Complete by:  As directed    Please follow up with Dr Sharol Given as recommended.     Allergies as of 04/02/2017      Reactions   Vancomycin Other (See Comments)   Acute renal failure suspected secondary to vanco      Medication List    STOP taking these medications   lisinopril 20 MG tablet Commonly known as:  PRINIVIL,ZESTRIL     TAKE these medications   ACCU-CHEK AVIVA PLUS w/Device Kit 1 each by Does not apply route 4 (four) times daily.   ACCU-CHEK FASTCLIX LANCETS Misc Check blood sugar 3 times a day before meals   amitriptyline 50 MG tablet Commonly known as:  ELAVIL Take 1 tablet (50 mg total) by mouth at bedtime.   amoxicillin-clavulanate 875-125 MG tablet Commonly known as:  AUGMENTIN Take 1 tablet every 12 (twelve) hours by mouth.  atorvastatin 20 MG tablet Commonly known as:  LIPITOR TAKE 1 TABLET BY MOUTH EVERY DAY   cyclobenzaprine 10 MG tablet Commonly known as:  FLEXERIL Take 1 tablet (10 mg total) by mouth every 12 (twelve) hours as needed for muscle spasms.   doxycycline 100 MG tablet Commonly known as:  VIBRA-TABS Take 1 tablet (100 mg total) every 12 (twelve) hours by mouth.   furosemide 20 MG tablet Commonly known as:  LASIX Take 2 tablets (40 mg total) daily by mouth. What changed:    when to take this  reasons  to take this   glucose blood test strip Commonly known as:  ACCU-CHEK AVIVA PLUS Check blood sugar 4 times a day before meals   HYDROcodone-acetaminophen 5-325 MG tablet Commonly known as:  NORCO/VICODIN Take 1-2 tablets every 4 (four) hours as needed by mouth for moderate pain.   insulin glargine 100 UNIT/ML injection Commonly known as:  LANTUS Inject 44 units below the skin at bedtime. Diagnosis code:  E11.65, Z79.4   Insulin Pen Needle 31G X 5 MM Misc Commonly known as:  B-D UF III MINI PEN NEEDLES Use 3 per day to inject Insulin   Insulin Syringe-Needle U-100 31G X 15/64" 0.5 ML Misc Use to inject insulin as isntructed   INVOKANA 300 MG Tabs tablet Generic drug:  canagliflozin Take 1 tablet by mouth daily before breakfast   liraglutide 18 MG/3ML Sopn Commonly known as:  VICTOZA Inject 0.3 mLs (1.8 mg total) into the skin daily.   LYRICA 100 MG capsule Generic drug:  pregabalin TAKE 1 CAPSULE BY MOUTH 2 TIMES DAILY   mupirocin ointment 2 % Commonly known as:  BACTROBAN Place 1 application 2 (two) times daily into the nose.   NOVOLOG FLEXPEN 100 UNIT/ML FlexPen Generic drug:  insulin aspart Inject 14 units below the skin 3 times daily with meals   prednisoLONE acetate 1 % ophthalmic suspension Commonly known as:  PRED FORTE Place 1 drop into the right eye 4 (four) times daily.   VOLTAREN 1 % Gel Generic drug:  diclofenac sodium Apply 4 grams to affected area (topically) 4 times daily      Follow-up Information    [facility].   Specialty:  [place] Why:  [facility] nursing/physical/occupational therapy/aide,social worker Contact information: [address] [place] [address] [phone]        [place] Follow up.   Why:  wide rolling walker, tub bench Contact information: [place] [address] [phone]        Newt Minion, MD Follow up in 1 week(s).    Specialty:  Orthopedic Surgery Contact information: [place] [address] [phone]        Colbert Ewing, MD. Schedule an appointment as soon as possible for a visit in 1 week(s).   Specialty:  Internal Medicine Contact information: [place] [address] [phone]          Allergies  Allergen Reactions  . Vancomycin Other (See Comments)    Acute renal failure suspected secondary to vanco    Consultations:  Orthopedics.   Wound care.    Procedures/Studies: Ct Foot Left Wo Contrast  Result Date: 03/24/2017 CLINICAL DATA:  Bilateral lower extremity swelling x2 days. EXAM: CT OF THE LEFT FOOT WITHOUT CONTRAST TECHNIQUE: Multidetector CT imaging of the left foot was performed according to the standard protocol. Multiplanar CT image reconstructions were  also generated. COMPARISON:  II 18 foot radiographs. FINDINGS: Bones/Joint/Cartilage Degenerative subchondral cysts of the medial, middle and lateral cuneiform secondary to osteoarthritis across navicular-cuneiform as well as the second and third TMT joints. No fracture or suspicious osseous lesions. Dorsal calcaneal enthesopathy. No joint dislocations. Ligaments Suboptimally assessed by CT. Muscles and Tendons No significant muscle atrophy. No intramuscular hemorrhage. The tendons crossing the ankle joint demonstrate no evidence of tenosynovitis or tendinopathy. Soft tissues Diffuse skin thickening and subcutaneous soft tissue induration consistent cellulitis and/or third spacing. Numerous calcified phleboliths are seen within the subcutaneous soft tissues about the ankle and foot. IMPRESSION: 1. Diffuse skin thickening and subcutaneous soft tissue edema consistent with cellulitis and/or third spacing of fluid possibly from chronic venous insufficiency. 2. Midfoot osteoarthritis with subcortical and subchondral cystic change of the cuneiforms. No acute fracture or bone destruction. 3.  Prominent dorsal calcaneal enthesophyte. Electronically Signed   By: Ashley Royalty M.D.   On: 03/24/2017 22:37   Mr Tibia Fibula Left W Wo Contrast  Result Date: 03/27/2017 CLINICAL DATA:  Erythema and swelling of the left lower leg x3 days. EXAM: MRI OF LOWER LEFT EXTREMITY WITHOUT AND WITH CONTRAST TECHNIQUE: Multiplanar, multisequence MR imaging of the left tibia and fibula including the left foot was performed both before and after administration of intravenous contrast. CONTRAST:  20 cc MULTIHANCE GADOBENATE DIMEGLUMINE 529 MG/ML IV SOLN COMPARISON:  03/24/2017 radiographs and CT of the left foot FINDINGS: Bones/Joint/Cartilage No acute fracture, bone destruction or marrow signal abnormalities. Degenerative subchondral cystic change of the midfoot consistent with osteoarthritis. No evidence of acute osteomyelitis. Ligaments Noncontributory Muscles and Tendons No evidence of pyomyositis or muscle atrophy. Intact tendons crossing the ankle joint. Soft tissues Diffuse soft tissue edema of the included left leg and foot with dermal blistering along the dorsum of the forefoot measuring 6 cm in length by 0.8 cm in thickness. IMPRESSION: 1. Diffuse subcutaneous edema of the included left foot, ankle and leg consistent with cellulitis, venous insufficiency and/or third spacing of fluid. 2. No acute marrow signal abnormality indicative acute osteomyelitis, fracture or suspicious osseous lesion. Electronically Signed   By: Ashley Royalty M.D.   On: 03/27/2017 19:55   Dg Chest Portable 1 View  Result Date: 03/24/2017 CLINICAL DATA:  41 year old female with sepsis. EXAM: PORTABLE CHEST 1 VIEW COMPARISON:  Chest radiograph dated 07/27/2012 FINDINGS: There is borderline cardiomegaly. Mild prominence of the central vasculature likely representing mild vascular congestion. There is no focal consolidation, pleural effusion, or pneumothorax. No acute osseous pathology. IMPRESSION: Borderline cardiomegaly with probable mild  vascular congestion. No focal consolidation. Electronically Signed   By: Anner Crete M.D.   On: 03/24/2017 19:07   Dg Foot Complete Left  Result Date: 03/24/2017 CLINICAL DATA:  Swelling in both lower extremities with chest pain and shortness of breath. EXAM: LEFT FOOT - COMPLETE 3+ VIEW COMPARISON:  None. FINDINGS: Cutaneous calcifications along the lower calf probably from venous insufficiency. Soft tissue swelling in the lower calf and foot. Dorsal spurring in the midfoot. Normal alignment at the Lisfranc joint. No acute bony findings. IMPRESSION: 1. Diffuse soft tissue swelling in the foot and lower calf. Calcifications in the cutaneous tissues the lower calf are probably from venous insufficiency. 2. Dorsal midfoot spurring. Electronically Signed   By: Van Clines M.D.   On: 03/24/2017 19:32       Subjective: No new complaints.   Discharge Exam: Vitals:   04/02/17 0907 04/02/17 0911  BP:    Pulse:  Resp:    Temp:    SpO2: 100% 100%   Vitals:   04/01/17 2051 04/02/17 0449 04/02/17 0907 04/02/17 0911  BP: 123/77 139/82    Pulse: 97 88    Resp: 18 18    Temp: 98.9 F (37.2 C) 98 F (36.7 C)    TempSrc: Oral Oral    SpO2: 100% 100% 100% 100%  Weight:      Height:        General: Pt is alert, awake, not in acute distress Cardiovascular: RRR, S1/S2 +, no rubs, no gallops Respiratory: CTA bilaterally, no wheezing, no rhonchi Abdominal: Soft, NT, ND, bowel sounds +     The results of significant diagnostics from this hospitalization (including imaging, microbiology, ancillary and laboratory) are listed below for reference.     Microbiology: Recent Results (from the past 240 hour(s))  Blood Culture (routine x 2)     Status: None   Collection Time: 03/24/17  6:28 PM  Result Value Ref Range Status   Specimen Description BLOOD RIGHT ANTECUBITAL  Final   Special Requests IN PEDIATRIC BOTTLE Blood Culture adequate volume  Final   Culture   Final    NO  GROWTH 5 DAYS Performed at [facility], [address] [place], [place] [address]    Report Status 03/29/2017 FINAL  Final  Blood Culture (routine x 2)     Status: None   Collection Time: 03/24/17  7:04 PM  Result Value Ref Range Status   Specimen Description BLOOD LEFT ANTECUBITAL  Final   Special Requests   Final    BOTTLES DRAWN AEROBIC AND ANAEROBIC Blood Culture results may not be optimal due to an inadequate volume of blood received in culture bottles   Culture   Final    NO GROWTH 5 DAYS Performed at [facility], [place] [address] [place], [place] [address]    Report Status 03/29/2017 FINAL  Final  MRSA PCR Screening     Status: Abnormal   Collection Time: 03/25/17  3:53 AM  Result Value Ref Range Status   MRSA by PCR POSITIVE (A) NEGATIVE Final    Comment:        The GeneXpert MRSA Assay (FDA approved for NASAL specimens only), is one component of a comprehensive MRSA colonization surveillance program. It is not intended to diagnose MRSA infection nor to guide or monitor treatment for MRSA infections. RESULT CALLED TO, READ BACK BY AND VERIFIED WITH: FRANKLIN,S AT 1055 ON [id number] BY HOOKER,B   Culture, Urine     Status: Abnormal   Collection Time: 03/30/17 12:41 PM  Result Value Ref Range Status   Specimen Description URINE, CLEAN CATCH  Final   Special Requests NONE  Final   Culture (A)  Final    <10,000 COLONIES/mL INSIGNIFICANT GROWTH Performed at [facility], [address] [place], [place] [address]    Report Status 03/31/2017 FINAL  Final     Labs: BNP (last 3 results) No results for input(s): BNP in the last 8760 hours. Basic Metabolic Panel: Recent Labs  Lab 03/30/17 0742 03/31/17 0528 04/01/17 0535 04/01/17 1436 04/02/17 0501 04/02/17 0901  NA 134* 137 134*  --  136 135  K 5.3* 5.2* 5.5* 5.4* 6.0* 4.8  CL 106 109 104  --  106 102  CO2 20* 23 23  --  21* 25  GLUCOSE 157* 173* 156*  --  176* 152*  BUN 21* 22* 19  --  21*  22*  CREATININE 0.88 0.90 0.89  --  0.93 0.89  CALCIUM 8.9 9.0 8.8*  --  8.8* 8.9   Liver Function Tests: No results for input(s): AST, ALT, ALKPHOS, BILITOT, PROT, ALBUMIN in the last 168 hours. No results for input(s): LIPASE, AMYLASE in the last 168 hours. No results for input(s): AMMONIA in the last 168 hours. CBC: Recent Labs  Lab 03/28/17 0318 03/29/17 0506  WBC 14.5* 13.7*  HGB 8.1* 8.4*  HCT 25.1* 25.9*  MCV 80.2 80.2  PLT 221 240   Cardiac Enzymes: No results for input(s): CKTOTAL, CKMB, CKMBINDEX, TROPONINI in the last 168 hours. BNP: Invalid input(s): POCBNP CBG: Recent Labs  Lab 04/01/17 2015 04/02/17 0035 04/02/17 0444 04/02/17 0757 04/02/17 1202  GLUCAP 234* 236* 180* 155* 253*   D-Dimer No results for input(s): DDIMER in the last 72 hours. Hgb A1c No results for input(s): HGBA1C in the last 72 hours. Lipid Profile No results for input(s): CHOL, HDL, LDLCALC, TRIG, CHOLHDL, LDLDIRECT in the last 72 hours. Thyroid function studies No results for input(s): TSH, T4TOTAL, T3FREE, THYROIDAB in the last 72 hours.  Invalid input(s): FREET3 Anemia work up No results for input(s): VITAMINB12, FOLATE, FERRITIN, TIBC, IRON, RETICCTPCT in the last 72 hours. Urinalysis    Component Value Date/Time   COLORURINE YELLOW 03/27/2017 [place] 03/27/2017 0812   LABSPEC 1.016 03/27/2017 0812   PHURINE 5.0 03/27/2017 0812   GLUCOSEU >=500 (A) 03/27/2017 0812   GLUCOSEU >=1000 (A) 03/16/2016 1038   HGBUR NEGATIVE 03/27/2017 0812   BILIRUBINUR NEGATIVE 03/27/2017 0812   KETONESUR 5 (A) 03/27/2017 0812   PROTEINUR 30 (A) 03/27/2017 0812   UROBILINOGEN 0.2 03/16/2016 1038   NITRITE NEGATIVE 03/27/2017 0812   LEUKOCYTESUR NEGATIVE 03/27/2017 0812   Sepsis Labs Invalid input(s): PROCALCITONIN,  WBC,  LACTICIDVEN Microbiology Recent Results (from the past 240 hour(s))  Blood Culture (routine x 2)     Status: None   Collection Time: 03/24/17  6:28  PM  Result Value Ref Range Status   Specimen Description BLOOD RIGHT ANTECUBITAL  Final   Special Requests IN PEDIATRIC BOTTLE Blood Culture adequate volume  Final   Culture   Final    NO GROWTH 5 DAYS Performed at [facility], [place] [address], [place], [place] [address]    Report Status 03/29/2017 FINAL  Final  Blood Culture (routine x 2)     Status: None   Collection Time: 03/24/17  7:04 PM  Result Value Ref Range Status   Specimen Description BLOOD LEFT ANTECUBITAL  Final   Special Requests   Final    BOTTLES DRAWN AEROBIC AND ANAEROBIC Blood Culture results may not be optimal due to an inadequate volume of blood received in culture bottles   Culture   Final    NO GROWTH 5 DAYS Performed at [facility], [place] [address], [place], [place] [address]    Report Status 03/29/2017 FINAL  Final  MRSA PCR Screening     Status: Abnormal   Collection Time: 03/25/17  3:53 AM  Result Value Ref Range Status   MRSA by PCR POSITIVE (A) NEGATIVE Final    Comment:        The GeneXpert MRSA Assay (FDA approved for NASAL specimens only), is one component of a comprehensive MRSA colonization surveillance program. It is not intended to diagnose MRSA infection nor to guide or monitor treatment for MRSA infections. RESULT CALLED TO, READ BACK BY AND VERIFIED WITH: Bronwen Betters  AT 1055 ON [id number] BY HOOKER,B   Culture, Urine     Status: Abnormal   Collection Time: 03/30/17 12:41 PM  Result Value Ref Range Status   Specimen Description URINE, CLEAN CATCH  Final   Special Requests NONE  Final   Culture (A)  Final    <10,000 COLONIES/mL INSIGNIFICANT GROWTH Performed at [facility], [address], [place], [place] [address]    Report Status 03/31/2017 FINAL  Final     Time coordinating discharge: Over 30 minutes  SIGNED:   Hosie Poisson, MD  [facility] 04/03/2017, 8:29 AM Pager   If 7PM-7AM, please contact night-coverage [email or web address] Password [email or web address]

## 2017-04-04 ENCOUNTER — Telehealth (INDEPENDENT_AMBULATORY_CARE_PROVIDER_SITE_OTHER): Payer: Self-pay | Admitting: Orthopedic Surgery

## 2017-04-04 DIAGNOSIS — F419 Anxiety disorder, unspecified: Secondary | ICD-10-CM | POA: Diagnosis not present

## 2017-04-04 DIAGNOSIS — L03116 Cellulitis of left lower limb: Secondary | ICD-10-CM | POA: Diagnosis not present

## 2017-04-04 DIAGNOSIS — L97401 Non-pressure chronic ulcer of unspecified heel and midfoot limited to breakdown of skin: Secondary | ICD-10-CM | POA: Diagnosis not present

## 2017-04-04 DIAGNOSIS — E669 Obesity, unspecified: Secondary | ICD-10-CM | POA: Diagnosis not present

## 2017-04-04 DIAGNOSIS — D631 Anemia in chronic kidney disease: Secondary | ICD-10-CM | POA: Diagnosis not present

## 2017-04-04 DIAGNOSIS — N183 Chronic kidney disease, stage 3 (moderate): Secondary | ICD-10-CM | POA: Diagnosis not present

## 2017-04-04 DIAGNOSIS — G4733 Obstructive sleep apnea (adult) (pediatric): Secondary | ICD-10-CM | POA: Diagnosis not present

## 2017-04-04 DIAGNOSIS — E1122 Type 2 diabetes mellitus with diabetic chronic kidney disease: Secondary | ICD-10-CM | POA: Diagnosis not present

## 2017-04-04 DIAGNOSIS — Z794 Long term (current) use of insulin: Secondary | ICD-10-CM | POA: Diagnosis not present

## 2017-04-04 DIAGNOSIS — E785 Hyperlipidemia, unspecified: Secondary | ICD-10-CM | POA: Diagnosis not present

## 2017-04-04 DIAGNOSIS — E11621 Type 2 diabetes mellitus with foot ulcer: Secondary | ICD-10-CM | POA: Diagnosis not present

## 2017-04-04 DIAGNOSIS — I130 Hypertensive heart and chronic kidney disease with heart failure and stage 1 through stage 4 chronic kidney disease, or unspecified chronic kidney disease: Secondary | ICD-10-CM | POA: Diagnosis not present

## 2017-04-04 DIAGNOSIS — F319 Bipolar disorder, unspecified: Secondary | ICD-10-CM | POA: Diagnosis not present

## 2017-04-04 DIAGNOSIS — E1140 Type 2 diabetes mellitus with diabetic neuropathy, unspecified: Secondary | ICD-10-CM | POA: Diagnosis not present

## 2017-04-04 DIAGNOSIS — I509 Heart failure, unspecified: Secondary | ICD-10-CM | POA: Diagnosis not present

## 2017-04-04 DIAGNOSIS — F4321 Adjustment disorder with depressed mood: Secondary | ICD-10-CM | POA: Diagnosis not present

## 2017-04-04 DIAGNOSIS — E1165 Type 2 diabetes mellitus with hyperglycemia: Secondary | ICD-10-CM | POA: Diagnosis not present

## 2017-04-04 NOTE — Telephone Encounter (Signed)
Called and advised per Dr. Sharol Given silver alginate to wound and dynaflex or profore dressing for compression to be applied 2 x q week. Pt to see Junie Panning on Thursday to call with questions.

## 2017-04-04 NOTE — Telephone Encounter (Signed)
Alexis-nurse with [facility] called advised doing start of care today. She advised she is seeing patient today at 11:00am and will need WD care orders. Alexis asked if she can be called prior to seeing the patient. The number to contact Ubaldo Glassing is [phone]

## 2017-04-05 ENCOUNTER — Other Ambulatory Visit: Payer: Self-pay

## 2017-04-05 ENCOUNTER — Ambulatory Visit: Payer: Medicare Other | Admitting: Physical Therapy

## 2017-04-05 ENCOUNTER — Ambulatory Visit: Payer: Self-pay | Admitting: Endocrinology

## 2017-04-05 ENCOUNTER — Other Ambulatory Visit: Payer: Self-pay | Admitting: *Deleted

## 2017-04-05 ENCOUNTER — Telehealth: Payer: Self-pay | Admitting: Physical Therapy

## 2017-04-05 DIAGNOSIS — E1165 Type 2 diabetes mellitus with hyperglycemia: Secondary | ICD-10-CM | POA: Diagnosis not present

## 2017-04-05 DIAGNOSIS — I509 Heart failure, unspecified: Secondary | ICD-10-CM | POA: Diagnosis not present

## 2017-04-05 DIAGNOSIS — L03116 Cellulitis of left lower limb: Secondary | ICD-10-CM | POA: Diagnosis not present

## 2017-04-05 DIAGNOSIS — L97401 Non-pressure chronic ulcer of unspecified heel and midfoot limited to breakdown of skin: Secondary | ICD-10-CM | POA: Diagnosis not present

## 2017-04-05 DIAGNOSIS — E11621 Type 2 diabetes mellitus with foot ulcer: Secondary | ICD-10-CM | POA: Diagnosis not present

## 2017-04-05 DIAGNOSIS — I130 Hypertensive heart and chronic kidney disease with heart failure and stage 1 through stage 4 chronic kidney disease, or unspecified chronic kidney disease: Secondary | ICD-10-CM | POA: Diagnosis not present

## 2017-04-05 MED ORDER — LIRAGLUTIDE 18 MG/3ML ~~LOC~~ SOPN: 1.8000 mg | PEN_INJECTOR | Freq: Every day | SUBCUTANEOUS | 3 refills | Status: DC

## 2017-04-05 NOTE — Telephone Encounter (Signed)
Spoke with [name] [name] that due being admitted to the hospital and undergoing treatment, it is a change in status which would required and updated referral for outpatient physical therapy to resume treatment. Pt reported she is unable to leave the house, she has a nurse aid and is undergoing HHPT. I discussed with her that since she going through that at home we could discharged here and when she is able to return to outpatient she would need a new referral and we could evaluate her at that time. [name] [name] agreed.

## 2017-04-05 NOTE — Telephone Encounter (Signed)
Requesting test strips to be filled @ [facility] on [address].

## 2017-04-06 ENCOUNTER — Ambulatory Visit (INDEPENDENT_AMBULATORY_CARE_PROVIDER_SITE_OTHER): Payer: Medicare Other | Admitting: Family

## 2017-04-06 ENCOUNTER — Encounter (HOSPITAL_COMMUNITY): Payer: Self-pay | Admitting: *Deleted

## 2017-04-06 ENCOUNTER — Other Ambulatory Visit: Payer: Self-pay

## 2017-04-06 ENCOUNTER — Other Ambulatory Visit (INDEPENDENT_AMBULATORY_CARE_PROVIDER_SITE_OTHER): Payer: Self-pay | Admitting: Orthopedic Surgery

## 2017-04-06 ENCOUNTER — Encounter (INDEPENDENT_AMBULATORY_CARE_PROVIDER_SITE_OTHER): Payer: Self-pay | Admitting: Family

## 2017-04-06 DIAGNOSIS — M726 Necrotizing fasciitis: Secondary | ICD-10-CM

## 2017-04-06 DIAGNOSIS — L02612 Cutaneous abscess of left foot: Secondary | ICD-10-CM

## 2017-04-06 NOTE — Progress Notes (Signed)
Pt denies SOB, chest pain, and being under the care of a cardiologist. Pt denies having a stress test and cardiac cath. Pt made aware to stop taking vitamins, fish oil and herbal medications. Do not take any NSAIDs ie: Ibuprofen, Advil, Naproxen (Aleve), Motrin, BC and Goody Powder. Pt made aware to take half dose of Lantus insulin (22 units) tonight and no diabetes medications the morning of procedure ( Victoza and Invokana). Pt made aware to check BG every 2 hours prior to arrival to hospital on DOS. Pt made aware to treat a BG < 70 with 4 ounces of apple juice, wait 15 minutes after drinking juice  to recheck BG, if BG remains < 70, call Short Stay unit to speak with a nurse. Pt verbalized understanding of all pre-op instructions.

## 2017-04-06 NOTE — Progress Notes (Signed)
Office Visit Note   Patient: [name] [name]           Date of Birth: [birth year]           MRN: [id number] Visit Date: 04/06/2017              Requested by: Colbert Ewing, MD [address] [place], [place] [address] PCP: Colbert Ewing, MD  Chief Complaint  Patient presents with  . Left Foot - Wound Check    Hospitalization follow up  Has been in [place] 4 layer wrap      HPI: Patient is a 41 year old woman who presents in follow-up from the hospital.  Patient was initially seen in the hospital she has an ulcer dorsally over the second toe.  She underwent debridement started on IV antibiotics was placed in compression wraps and was discharged on oral antibiotics with continuation of the compression wraps.  Patient presents at this time complaining of increasing swelling increasing ulceration increasing pain in her left foot.  Initial CT scan and MRI scan were negative for osteomyelitis or abscess.  Assessment & Plan: Visit Diagnoses:  1. Necrotizing fasciitis s/p OR debridements     Plan: Discussed with patient she does not have any foot salvage options.  She has ulceration and necrotic tissue that extends up to the ankle she has swelling brawny skin color changes she does not have sufficient soft tissue for a partial foot amputation she will need to proceed with a transtibial amputation.  She is still on 2 oral antibiotics and will plan for surgery tomorrow Friday she will need discharge to skilled nursing.  Follow-Up Instructions: Return in about 2 weeks (around 04/20/2017).   Ortho Exam  Patient is alert, oriented, no adenopathy, well-dressed, normal affect, normal respiratory effort. Examination patient has increased swelling to the left lower extremity there is no cellulitis or ulceration in the leg.  She has necrotic skin and soft tissue with extension to the hindfoot of the left foot.  The necrotic ulcer dorsally over the second toe was larger.  There is exposed bone  and fascia.  There is no purulent drainage.  Patient does have a good palpable dorsalis pedis pulse no arterial insufficiency she does have venous insufficiency.  Imaging: No results found. No images are attached to the encounter.  Labs: Lab Results  Component Value Date   HGBA1C 9.0 (H) 03/25/2017   HGBA1C 11.3 (H) 11/21/2016   HGBA1C 8.1 08/05/2016   ESRSEDRATE 130 (H) 03/24/2017   ESRSEDRATE 53 (H) 09/14/2015   ESRSEDRATE 132 (H) 07/06/2012   CRP 5.4 (H) 09/14/2015   CRP 23.0 (H) 07/06/2012   REPTSTATUS 03/31/2017 FINAL 03/30/2017   GRAMSTAIN  07/29/2016    RARE WBC PRESENT, PREDOMINANTLY PMN RARE GRAM POSITIVE COCCI IN PAIRS    CULT (A) 03/30/2017    <10,000 COLONIES/mL INSIGNIFICANT GROWTH Performed at [facility], [place] [address], [place], [place] [address]    [place] 05/21/2014    Orders:  No orders of the defined types were placed in this encounter.  No orders of the defined types were placed in this encounter.    Procedures: No procedures performed  Clinical Data: No additional findings.  ROS:  All other systems negative, except as noted in the HPI. Review of Systems  Objective: Vital Signs: There were no vitals taken for this visit.  Specialty Comments:  No specialty comments available.  PMFS History: Patient Active Problem List   Diagnosis Date Noted  .  Adjustment disorder with depressed mood 03/26/2017  . Left leg cellulitis   . Severe sepsis ([place]) 03/24/2017  . Hyponatremia 03/24/2017  . Abnormal ECG 03/24/2017  . Sepsis ([place]) 03/24/2017  . Acute bilateral low back pain 02/13/2017  . Heart failure with preserved ejection fraction ([place]) 02/09/2017  . Jerking movements of extremities 12/08/2016  . Routine adult health maintenance 12/08/2016  . Chronic pain of right knee 09/08/2016  . Abdominal muscle pain 09/08/2016  . Vertigo 08/05/2016  . Lightheadedness 07/15/2016  . CKD (chronic kidney disease) stage 2,  GFR 60-89 ml/min 07/15/2016  . Bilateral lower extremity edema 05/13/2016  . OSA on CPAP 05/13/2016  . Vaginal pruritus 03/14/2016  . Morbid obesity due to excess calories ([place]) 11/27/2015  . Callus of foot 11/26/2015  . History of Low serum cortisol level ([place]) 01/07/2015  . Financial difficulty 04/25/2013  . Uncontrolled type 2 diabetes mellitus ([place]) 07/12/2012  . Necrotizing fasciitis s/p OR debridements 07/06/2012  . Carpal tunnel syndrome, bilateral 01/25/2012  . Diabetic peripheral neuropathy ([place]) 07/04/2011  . Anxiety and depression 06/02/2011  . History of abnormal cervical Pap smear 12/13/2007  . GERD 12/06/2007  . Hyperlipidemia 08/29/2007  . Essential hypertension 08/29/2007   Past Medical History:  Diagnosis Date  . Abnormal Pap smear of cervix 2009  . Abscess    history of multiple abscesses  . Anemia of chronic disease 2002  . Anxiety   . Bipolar disorder ([place])   . Cellulitis 05/21/2014   right eye  . Chronic bronchitis ([place])    "get it q yr" (05/13/2013)  . Chronic pain   . Depression   . Edema of lower extremity   . Endocarditis 2002   subacute bacterial endocarditis.   . Family history of anesthesia complication    "my mom has a hard time coming out from under"  . GERD (gastroesophageal reflux disease)   . Heart murmur   . History of blood transfusion    "just low blood count" (05/13/2013)  . Hyperlipidemia   . Hypertension   . Hypothyroidism   . Hypothyroidism, adult 03/21/2014  . Obesity   . OSA on CPAP   . Peripheral neuropathy   . Type II diabetes mellitus ([facility])     Family History  Problem Relation Age of Onset  . Heart failure Mother   . Diabetes Mother   . Kidney disease Mother   . Kidney disease Father   . Diabetes Father   . Diabetes Paternal Grandmother   . Heart failure Paternal Grandmother   . Other Other     Past Surgical History:  Procedure Laterality Date  . INCISION AND DRAINAGE ABSCESS     multiple I&Ds  . INCISION  AND DRAINAGE ABSCESS Left 07/09/2012   Procedure: DRESSING CHANGE, THIGH WOUND;  Surgeon: Harl Bowie, MD;  Location: [facility];  Service: General;  Laterality: Left;  . INCISION AND DRAINAGE OF WOUND Left 07/07/2012   Procedure: IRRIGATION AND DEBRIDEMENT WOUND;  Surgeon: Harl Bowie, MD;  Location: [facility];  Service: General;  Laterality: Left;  . INCISION AND DRAINAGE PERIRECTAL ABSCESS Left 07/14/2012   Procedure: DEBRIDEMENT OF SKIN & SOFT TISSUE; DRESSING CHANGE UNDER ANESTHESIA;  Surgeon: Gayland Curry, MD,FACS;  Location: [facility];  Service: General;  Laterality: Left;  . INCISION AND DRAINAGE PERIRECTAL ABSCESS Left 07/16/2012   Procedure: I&D Left Thigh;  Surgeon: Gwenyth Ober, MD;  Location: [facility];  Service: General;  Laterality: Left;  . INCISION AND DRAINAGE  PERIRECTAL ABSCESS N/A 01/05/2015   Procedure: IRRIGATION AND DEBRIDEMENT PERIRECTAL ABSCESS;  Surgeon: Donnie Mesa, MD;  Location: [facility];  Service: General;  Laterality: N/A;  . IRRIGATION AND DEBRIDEMENT ABSCESS Left 07/06/2012   Procedure: IRRIGATION AND DEBRIDEMENT ABSCESS BUTTOCKS AND THIGH;  Surgeon: Shann Medal, MD;  Location: [facility];  Service: General;  Laterality: Left;  . IRRIGATION AND DEBRIDEMENT ABSCESS Left 08/10/2012   Procedure: IRRIGATION AND DEBRIDEMENT ABSCESS;  Surgeon: Madilyn Hook, DO;  Location: [facility];  Service: General;  Laterality: Left;  . TONSILLECTOMY  1994   Social History   Occupational History  . Occupation: disability  Tobacco Use  . Smoking status: Current Every Day Smoker    Packs/day: 0.50    Years: 6.00    Pack years: 3.00    Types: Cigarettes  . Smokeless tobacco: Never Used  . Tobacco comment: Cutting back  Substance and Sexual Activity  . Alcohol use: Yes    Alcohol/week: 0.0 oz    Comment: Socially.  . Drug use: No  . Sexual activity: Yes

## 2017-04-07 ENCOUNTER — Encounter (HOSPITAL_COMMUNITY): Payer: Self-pay | Admitting: *Deleted

## 2017-04-07 ENCOUNTER — Encounter (HOSPITAL_COMMUNITY)
Admission: RE | Disposition: A | Discharge status: Rehabilitation Facility | Payer: Self-pay | Source: Ambulatory Visit | Attending: Orthopedic Surgery

## 2017-04-07 ENCOUNTER — Inpatient Hospital Stay (HOSPITAL_COMMUNITY)
Admission: RE | Admit: 2017-04-07 | Discharge: 2017-04-11 | DRG: 240 | Disposition: A | Discharge status: Rehabilitation Facility | Payer: Medicare Other | Source: Ambulatory Visit | Attending: Orthopedic Surgery | Admitting: Orthopedic Surgery

## 2017-04-07 ENCOUNTER — Inpatient Hospital Stay (HOSPITAL_COMMUNITY): Payer: Medicare Other | Admitting: Certified Registered Nurse Anesthetist

## 2017-04-07 ENCOUNTER — Encounter: Payer: Self-pay | Admitting: Physical Therapy

## 2017-04-07 DIAGNOSIS — G4733 Obstructive sleep apnea (adult) (pediatric): Secondary | ICD-10-CM | POA: Diagnosis not present

## 2017-04-07 DIAGNOSIS — E11621 Type 2 diabetes mellitus with foot ulcer: Secondary | ICD-10-CM | POA: Diagnosis not present

## 2017-04-07 DIAGNOSIS — I10 Essential (primary) hypertension: Secondary | ICD-10-CM | POA: Diagnosis not present

## 2017-04-07 DIAGNOSIS — S88112S Complete traumatic amputation at level between knee and ankle, left lower leg, sequela: Secondary | ICD-10-CM | POA: Diagnosis not present

## 2017-04-07 DIAGNOSIS — K219 Gastro-esophageal reflux disease without esophagitis: Secondary | ICD-10-CM | POA: Diagnosis not present

## 2017-04-07 DIAGNOSIS — D62 Acute posthemorrhagic anemia: Secondary | ICD-10-CM | POA: Diagnosis not present

## 2017-04-07 DIAGNOSIS — E1152 Type 2 diabetes mellitus with diabetic peripheral angiopathy with gangrene: Principal | ICD-10-CM | POA: Diagnosis present

## 2017-04-07 DIAGNOSIS — L02612 Cutaneous abscess of left foot: Secondary | ICD-10-CM | POA: Diagnosis not present

## 2017-04-07 DIAGNOSIS — R609 Edema, unspecified: Secondary | ICD-10-CM | POA: Diagnosis not present

## 2017-04-07 DIAGNOSIS — E669 Obesity, unspecified: Secondary | ICD-10-CM | POA: Diagnosis not present

## 2017-04-07 DIAGNOSIS — Z4781 Encounter for orthopedic aftercare following surgical amputation: Secondary | ICD-10-CM | POA: Diagnosis not present

## 2017-04-07 DIAGNOSIS — F17210 Nicotine dependence, cigarettes, uncomplicated: Secondary | ICD-10-CM | POA: Diagnosis present

## 2017-04-07 DIAGNOSIS — D72829 Elevated white blood cell count, unspecified: Secondary | ICD-10-CM

## 2017-04-07 DIAGNOSIS — E1122 Type 2 diabetes mellitus with diabetic chronic kidney disease: Secondary | ICD-10-CM | POA: Diagnosis present

## 2017-04-07 DIAGNOSIS — N183 Chronic kidney disease, stage 3 (moderate): Secondary | ICD-10-CM | POA: Diagnosis present

## 2017-04-07 DIAGNOSIS — I129 Hypertensive chronic kidney disease with stage 1 through stage 4 chronic kidney disease, or unspecified chronic kidney disease: Secondary | ICD-10-CM | POA: Diagnosis present

## 2017-04-07 DIAGNOSIS — IMO0002 Reserved for concepts with insufficient information to code with codable children: Secondary | ICD-10-CM

## 2017-04-07 DIAGNOSIS — Z841 Family history of disorders of kidney and ureter: Secondary | ICD-10-CM | POA: Diagnosis not present

## 2017-04-07 DIAGNOSIS — Z6841 Body Mass Index (BMI) 40.0 and over, adult: Secondary | ICD-10-CM

## 2017-04-07 DIAGNOSIS — K5900 Constipation, unspecified: Secondary | ICD-10-CM | POA: Diagnosis not present

## 2017-04-07 DIAGNOSIS — D638 Anemia in other chronic diseases classified elsewhere: Secondary | ICD-10-CM | POA: Diagnosis present

## 2017-04-07 DIAGNOSIS — E871 Hypo-osmolality and hyponatremia: Secondary | ICD-10-CM | POA: Diagnosis not present

## 2017-04-07 DIAGNOSIS — F319 Bipolar disorder, unspecified: Secondary | ICD-10-CM

## 2017-04-07 DIAGNOSIS — F419 Anxiety disorder, unspecified: Secondary | ICD-10-CM | POA: Diagnosis present

## 2017-04-07 DIAGNOSIS — I96 Gangrene, not elsewhere classified: Secondary | ICD-10-CM | POA: Diagnosis present

## 2017-04-07 DIAGNOSIS — Z794 Long term (current) use of insulin: Secondary | ICD-10-CM

## 2017-04-07 DIAGNOSIS — Z881 Allergy status to other antibiotic agents status: Secondary | ICD-10-CM | POA: Diagnosis not present

## 2017-04-07 DIAGNOSIS — E1165 Type 2 diabetes mellitus with hyperglycemia: Secondary | ICD-10-CM | POA: Diagnosis not present

## 2017-04-07 DIAGNOSIS — Z833 Family history of diabetes mellitus: Secondary | ICD-10-CM | POA: Diagnosis not present

## 2017-04-07 DIAGNOSIS — M797 Fibromyalgia: Secondary | ICD-10-CM | POA: Diagnosis present

## 2017-04-07 DIAGNOSIS — G8918 Other acute postprocedural pain: Secondary | ICD-10-CM

## 2017-04-07 DIAGNOSIS — E785 Hyperlipidemia, unspecified: Secondary | ICD-10-CM | POA: Diagnosis present

## 2017-04-07 DIAGNOSIS — Z8249 Family history of ischemic heart disease and other diseases of the circulatory system: Secondary | ICD-10-CM

## 2017-04-07 DIAGNOSIS — S88112D Complete traumatic amputation at level between knee and ankle, left lower leg, subsequent encounter: Secondary | ICD-10-CM | POA: Diagnosis not present

## 2017-04-07 DIAGNOSIS — L97409 Non-pressure chronic ulcer of unspecified heel and midfoot with unspecified severity: Secondary | ICD-10-CM | POA: Diagnosis not present

## 2017-04-07 DIAGNOSIS — W19XXXS Unspecified fall, sequela: Secondary | ICD-10-CM | POA: Diagnosis not present

## 2017-04-07 DIAGNOSIS — L97523 Non-pressure chronic ulcer of other part of left foot with necrosis of muscle: Secondary | ICD-10-CM | POA: Diagnosis not present

## 2017-04-07 DIAGNOSIS — E039 Hypothyroidism, unspecified: Secondary | ICD-10-CM | POA: Diagnosis present

## 2017-04-07 DIAGNOSIS — E1142 Type 2 diabetes mellitus with diabetic polyneuropathy: Secondary | ICD-10-CM

## 2017-04-07 DIAGNOSIS — G8929 Other chronic pain: Secondary | ICD-10-CM | POA: Diagnosis present

## 2017-04-07 DIAGNOSIS — E119 Type 2 diabetes mellitus without complications: Secondary | ICD-10-CM | POA: Diagnosis not present

## 2017-04-07 DIAGNOSIS — Z89512 Acquired absence of left leg below knee: Secondary | ICD-10-CM | POA: Diagnosis not present

## 2017-04-07 DIAGNOSIS — L03116 Cellulitis of left lower limb: Secondary | ICD-10-CM | POA: Diagnosis not present

## 2017-04-07 DIAGNOSIS — W19XXXA Unspecified fall, initial encounter: Secondary | ICD-10-CM | POA: Diagnosis not present

## 2017-04-07 HISTORY — PX: AMPUTATION: SHX166

## 2017-04-07 HISTORY — DX: Fibromyalgia: M79.7

## 2017-04-07 HISTORY — DX: Gangrene, not elsewhere classified: I96

## 2017-04-07 LAB — GLUCOSE, CAPILLARY
GLUCOSE-CAPILLARY: 111 mg/dL — AB (ref 65–99)
Glucose-Capillary: 74 mg/dL (ref 65–99)
Glucose-Capillary: 79 mg/dL (ref 65–99)
Glucose-Capillary: 174 mg/dL — ABNORMAL HIGH (ref 65–99)

## 2017-04-07 LAB — POCT I-STAT 4, (NA,K, GLUC, HGB,HCT)
Glucose, Bld: 110 mg/dL — ABNORMAL HIGH (ref 65–99)
HEMATOCRIT: 30 % — AB (ref 36.0–46.0)
Hemoglobin: 10.2 g/dL — ABNORMAL LOW (ref 12.0–15.0)
Potassium: 4.7 mmol/L (ref 3.5–5.1)
SODIUM: 142 mmol/L (ref 135–145)

## 2017-04-07 SURGERY — AMPUTATION BELOW KNEE
Anesthesia: General | Laterality: Left

## 2017-04-07 MED ORDER — OXYCODONE HCL 5 MG PO TABS
5.0000 mg | ORAL_TABLET | ORAL | Status: DC | PRN
  Administered 2017-04-08 (×2): 5 mg via ORAL
  Filled 2017-04-07 (×3): qty 1

## 2017-04-07 MED ORDER — INSULIN GLARGINE 100 UNIT/ML ~~LOC~~ SOLN
30.0000 [IU] | Freq: Every day | SUBCUTANEOUS | Status: DC
  Administered 2017-04-07 – 2017-04-11 (×4): 30 [IU] via SUBCUTANEOUS
  Filled 2017-04-07 (×5): qty 0.3

## 2017-04-07 MED ORDER — PREGABALIN 100 MG PO CAPS
100.0000 mg | ORAL_CAPSULE | Freq: Two times a day (BID) | ORAL | Status: DC
  Administered 2017-04-07 – 2017-04-11 (×8): 100 mg via ORAL
  Filled 2017-04-07 (×8): qty 1

## 2017-04-07 MED ORDER — ATORVASTATIN CALCIUM 20 MG PO TABS
20.0000 mg | ORAL_TABLET | Freq: Every day | ORAL | Status: DC
  Administered 2017-04-07 – 2017-04-11 (×5): 20 mg via ORAL
  Filled 2017-04-07 (×5): qty 1

## 2017-04-07 MED ORDER — METOCLOPRAMIDE HCL 5 MG/ML IJ SOLN: 5.0000 mg | Freq: Three times a day (TID) | INTRAMUSCULAR | Status: DC | PRN

## 2017-04-07 MED ORDER — MUPIROCIN 2 % EX OINT
1.0000 "application " | TOPICAL_OINTMENT | Freq: Two times a day (BID) | CUTANEOUS | Status: DC
  Administered 2017-04-07 – 2017-04-11 (×8): 1 via NASAL
  Filled 2017-04-07 (×2): qty 22

## 2017-04-07 MED ORDER — METHOCARBAMOL 500 MG PO TABS
500.0000 mg | ORAL_TABLET | Freq: Four times a day (QID) | ORAL | Status: DC | PRN
  Administered 2017-04-07 – 2017-04-11 (×8): 500 mg via ORAL
  Filled 2017-04-07 (×7): qty 1

## 2017-04-07 MED ORDER — PROPOFOL 10 MG/ML IV BOLUS
INTRAVENOUS | Status: DC | PRN
  Administered 2017-04-07: 200 mg via INTRAVENOUS

## 2017-04-07 MED ORDER — CEFAZOLIN SODIUM-DEXTROSE 2-4 GM/100ML-% IV SOLN
2.0000 g | INTRAVENOUS | Status: AC
  Administered 2017-04-07: 3 g via INTRAVENOUS

## 2017-04-07 MED ORDER — PROPOFOL 10 MG/ML IV BOLUS
INTRAVENOUS | Status: AC
  Filled 2017-04-07: qty 20

## 2017-04-07 MED ORDER — CHLORHEXIDINE GLUCONATE 4 % EX LIQD: 60.0000 mL | Freq: Once | CUTANEOUS | Status: DC

## 2017-04-07 MED ORDER — CEFAZOLIN SODIUM-DEXTROSE 2-4 GM/100ML-% IV SOLN
INTRAVENOUS | Status: AC
  Filled 2017-04-07: qty 100

## 2017-04-07 MED ORDER — ASPIRIN EC 325 MG PO TBEC
325.0000 mg | DELAYED_RELEASE_TABLET | Freq: Every day | ORAL | Status: DC
  Administered 2017-04-08 – 2017-04-11 (×4): 325 mg via ORAL
  Filled 2017-04-07 (×4): qty 1

## 2017-04-07 MED ORDER — FENTANYL CITRATE (PF) 100 MCG/2ML IJ SOLN
INTRAMUSCULAR | Status: AC
  Filled 2017-04-07: qty 2

## 2017-04-07 MED ORDER — ACETAMINOPHEN 650 MG RE SUPP: 650.0000 mg | RECTAL | Status: DC | PRN

## 2017-04-07 MED ORDER — PROMETHAZINE HCL 25 MG/ML IJ SOLN
6.2500 mg | INTRAMUSCULAR | Status: DC | PRN
  Administered 2017-04-07: 6.25 mg via INTRAVENOUS

## 2017-04-07 MED ORDER — FENTANYL CITRATE (PF) 250 MCG/5ML IJ SOLN
INTRAMUSCULAR | Status: AC
Start: 2017-04-07 — End: ?
  Filled 2017-04-07: qty 5

## 2017-04-07 MED ORDER — ONDANSETRON HCL 4 MG/2ML IJ SOLN
INTRAMUSCULAR | Status: DC | PRN
  Administered 2017-04-07: 4 mg via INTRAVENOUS

## 2017-04-07 MED ORDER — SODIUM CHLORIDE 0.9 % IV SOLN: INTRAVENOUS | Status: DC

## 2017-04-07 MED ORDER — INSULIN ASPART 100 UNIT/ML ~~LOC~~ SOLN
0.0000 [IU] | Freq: Three times a day (TID) | SUBCUTANEOUS | Status: DC
  Administered 2017-04-08 – 2017-04-09 (×3): 3 [IU] via SUBCUTANEOUS
  Administered 2017-04-09: 4 [IU] via SUBCUTANEOUS
  Administered 2017-04-09: 3 [IU] via SUBCUTANEOUS
  Administered 2017-04-10: 7 [IU] via SUBCUTANEOUS
  Administered 2017-04-10: 4 [IU] via SUBCUTANEOUS
  Administered 2017-04-11: 7 [IU] via SUBCUTANEOUS
  Administered 2017-04-11 (×2): 4 [IU] via SUBCUTANEOUS

## 2017-04-07 MED ORDER — ALBUMIN HUMAN 5 % IV SOLN
INTRAVENOUS | Status: DC | PRN
  Administered 2017-04-07 (×2): via INTRAVENOUS

## 2017-04-07 MED ORDER — OXYCODONE HCL 5 MG PO TABS
ORAL_TABLET | ORAL | Status: AC
  Administered 2017-04-07: 5 mg via ORAL
  Filled 2017-04-07: qty 1

## 2017-04-07 MED ORDER — SUCCINYLCHOLINE CHLORIDE 20 MG/ML IJ SOLN
INTRAMUSCULAR | Status: DC | PRN
  Administered 2017-04-07: 120 mg via INTRAVENOUS

## 2017-04-07 MED ORDER — ONDANSETRON HCL 4 MG PO TABS: 4.0000 mg | ORAL_TABLET | Freq: Four times a day (QID) | ORAL | Status: DC | PRN

## 2017-04-07 MED ORDER — LABETALOL HCL 5 MG/ML IV SOLN
INTRAVENOUS | Status: AC
  Administered 2017-04-07: 10 mg via INTRAVENOUS
  Filled 2017-04-07: qty 4

## 2017-04-07 MED ORDER — AMITRIPTYLINE HCL 50 MG PO TABS
50.0000 mg | ORAL_TABLET | Freq: Every day | ORAL | Status: DC
  Administered 2017-04-07 – 2017-04-10 (×4): 50 mg via ORAL
  Filled 2017-04-07 (×4): qty 1

## 2017-04-07 MED ORDER — FUROSEMIDE 40 MG PO TABS
40.0000 mg | ORAL_TABLET | Freq: Every day | ORAL | Status: DC
  Administered 2017-04-08 – 2017-04-11 (×4): 40 mg via ORAL
  Filled 2017-04-07 (×4): qty 1

## 2017-04-07 MED ORDER — 0.9 % SODIUM CHLORIDE (POUR BTL) OPTIME
TOPICAL | Status: DC | PRN
  Administered 2017-04-07: 1000 mL

## 2017-04-07 MED ORDER — ONDANSETRON HCL 4 MG/2ML IJ SOLN: 4.0000 mg | Freq: Four times a day (QID) | INTRAMUSCULAR | Status: DC | PRN

## 2017-04-07 MED ORDER — OXYCODONE HCL 5 MG PO TABS
5.0000 mg | ORAL_TABLET | Freq: Once | ORAL | Status: AC | PRN
Start: 2017-04-07 — End: 2017-04-07
  Administered 2017-04-07: 5 mg via ORAL

## 2017-04-07 MED ORDER — FENTANYL CITRATE (PF) 250 MCG/5ML IJ SOLN
INTRAMUSCULAR | Status: AC
  Filled 2017-04-07: qty 5

## 2017-04-07 MED ORDER — HYDROMORPHONE HCL 1 MG/ML IJ SOLN
0.2500 mg | INTRAMUSCULAR | Status: DC | PRN
  Administered 2017-04-07 (×2): 0.5 mg via INTRAVENOUS

## 2017-04-07 MED ORDER — LIDOCAINE HCL (CARDIAC) 20 MG/ML IV SOLN
INTRAVENOUS | Status: DC | PRN
  Administered 2017-04-07: 100 mg via INTRAVENOUS

## 2017-04-07 MED ORDER — MEPERIDINE HCL 25 MG/ML IJ SOLN: 6.2500 mg | INTRAMUSCULAR | Status: DC | PRN

## 2017-04-07 MED ORDER — WHITE PETROLATUM EX OINT
TOPICAL_OINTMENT | CUTANEOUS | Status: AC
  Administered 2017-04-07: 22:00:00
  Filled 2017-04-07: qty 28.35

## 2017-04-07 MED ORDER — OXYCODONE HCL 5 MG/5ML PO SOLN: 5.0000 mg | Freq: Once | ORAL | Status: AC | PRN

## 2017-04-07 MED ORDER — LACTATED RINGERS IV SOLN
INTRAVENOUS | Status: DC
Start: 2017-04-07 — End: 2017-04-07
  Administered 2017-04-07 (×2): via INTRAVENOUS

## 2017-04-07 MED ORDER — METOCLOPRAMIDE HCL 5 MG PO TABS: 5.0000 mg | ORAL_TABLET | Freq: Three times a day (TID) | ORAL | Status: DC | PRN

## 2017-04-07 MED ORDER — POLYETHYLENE GLYCOL 3350 17 G PO PACK: 17.0000 g | PACK | Freq: Every day | ORAL | Status: DC | PRN

## 2017-04-07 MED ORDER — MIDAZOLAM HCL 2 MG/2ML IJ SOLN
INTRAMUSCULAR | Status: AC
Start: 2017-04-07 — End: ?
  Filled 2017-04-07: qty 2

## 2017-04-07 MED ORDER — METHOCARBAMOL 1000 MG/10ML IJ SOLN
500.0000 mg | Freq: Four times a day (QID) | INTRAVENOUS | Status: DC | PRN
  Filled 2017-04-07: qty 5

## 2017-04-07 MED ORDER — ACETAMINOPHEN 325 MG PO TABS: 650.0000 mg | ORAL_TABLET | ORAL | Status: DC | PRN

## 2017-04-07 MED ORDER — LABETALOL HCL 5 MG/ML IV SOLN
10.0000 mg | Freq: Once | INTRAVENOUS | Status: AC
  Administered 2017-04-07: 10 mg via INTRAVENOUS

## 2017-04-07 MED ORDER — BISACODYL 10 MG RE SUPP: 10.0000 mg | Freq: Every day | RECTAL | Status: DC | PRN

## 2017-04-07 MED ORDER — PROMETHAZINE HCL 25 MG/ML IJ SOLN
INTRAMUSCULAR | Status: AC
  Administered 2017-04-07: 6.25 mg via INTRAVENOUS
  Filled 2017-04-07: qty 1

## 2017-04-07 MED ORDER — FENTANYL CITRATE (PF) 100 MCG/2ML IJ SOLN
INTRAMUSCULAR | Status: DC | PRN
  Administered 2017-04-07: 50 ug via INTRAVENOUS
  Administered 2017-04-07: 150 ug via INTRAVENOUS
  Administered 2017-04-07 (×4): 50 ug via INTRAVENOUS
  Administered 2017-04-07: 100 ug via INTRAVENOUS
  Administered 2017-04-07 (×2): 50 ug via INTRAVENOUS

## 2017-04-07 MED ORDER — DOCUSATE SODIUM 100 MG PO CAPS
100.0000 mg | ORAL_CAPSULE | Freq: Two times a day (BID) | ORAL | Status: DC
  Administered 2017-04-07 – 2017-04-11 (×8): 100 mg via ORAL
  Filled 2017-04-07 (×8): qty 1

## 2017-04-07 MED ORDER — OXYCODONE HCL 5 MG PO TABS
10.0000 mg | ORAL_TABLET | ORAL | Status: DC | PRN
  Administered 2017-04-07 – 2017-04-11 (×10): 10 mg via ORAL
  Filled 2017-04-07 (×12): qty 2

## 2017-04-07 MED ORDER — MIDAZOLAM HCL 5 MG/5ML IJ SOLN
INTRAMUSCULAR | Status: DC | PRN
  Administered 2017-04-07: 2 mg via INTRAVENOUS

## 2017-04-07 MED ORDER — HYDROMORPHONE HCL 1 MG/ML IJ SOLN
1.0000 mg | INTRAMUSCULAR | Status: DC | PRN
  Administered 2017-04-07 – 2017-04-10 (×16): 1 mg via INTRAVENOUS
  Filled 2017-04-07 (×16): qty 1

## 2017-04-07 MED ORDER — METHOCARBAMOL 500 MG PO TABS
ORAL_TABLET | ORAL | Status: AC
  Administered 2017-04-07: 500 mg via ORAL
  Filled 2017-04-07: qty 1

## 2017-04-07 MED ORDER — MAGNESIUM CITRATE PO SOLN: 1.0000 | Freq: Once | ORAL | Status: DC | PRN

## 2017-04-07 MED ORDER — HYDROMORPHONE HCL 1 MG/ML IJ SOLN
INTRAMUSCULAR | Status: AC
  Administered 2017-04-07: 0.5 mg via INTRAVENOUS
  Filled 2017-04-07: qty 1

## 2017-04-07 MED ORDER — CEFAZOLIN SODIUM-DEXTROSE 2-4 GM/100ML-% IV SOLN
2.0000 g | Freq: Four times a day (QID) | INTRAVENOUS | Status: AC
  Administered 2017-04-07 – 2017-04-08 (×3): 2 g via INTRAVENOUS
  Filled 2017-04-07 (×3): qty 100

## 2017-04-07 MED ORDER — INSULIN ASPART 100 UNIT/ML ~~LOC~~ SOLN
6.0000 [IU] | Freq: Three times a day (TID) | SUBCUTANEOUS | Status: DC
  Administered 2017-04-08 – 2017-04-11 (×11): 6 [IU] via SUBCUTANEOUS

## 2017-04-07 MED ORDER — LIRAGLUTIDE 18 MG/3ML ~~LOC~~ SOPN: 1.8000 mg | PEN_INJECTOR | Freq: Every day | SUBCUTANEOUS | Status: DC

## 2017-04-07 SURGICAL SUPPLY — 47 items
BLADE SAW RECIP 87.9 MT (BLADE) ×3 IMPLANT
BLADE SURG 21 STRL SS (BLADE) ×3 IMPLANT
BNDG COHESIVE 3X5 TAN STRL LF (GAUZE/BANDAGES/DRESSINGS) ×2 IMPLANT
BNDG COHESIVE 6X5 TAN STRL LF (GAUZE/BANDAGES/DRESSINGS) ×6 IMPLANT
BNDG GAUZE ELAST 4 BULKY (GAUZE/BANDAGES/DRESSINGS) ×6 IMPLANT
CANISTER WOUND CARE 500ML ATS (WOUND CARE) ×2 IMPLANT
COVER MAYO STAND STRL (DRAPES) ×2 IMPLANT
COVER SURGICAL LIGHT HANDLE (MISCELLANEOUS) ×3 IMPLANT
CUFF TOURNIQUET SINGLE 34IN LL (TOURNIQUET CUFF) IMPLANT
CUFF TOURNIQUET SINGLE 44IN (TOURNIQUET CUFF) ×2 IMPLANT
DRAPE INCISE IOBAN 66X45 STRL (DRAPES) IMPLANT
DRAPE U-SHAPE 47X51 STRL (DRAPES) ×3 IMPLANT
DRESSING PREVENA PLUS CUSTOM (GAUZE/BANDAGES/DRESSINGS) IMPLANT
DRSG PREVENA PLUS CUSTOM (GAUZE/BANDAGES/DRESSINGS) ×3
DRSG VAC ATS MED SENSATRAC (GAUZE/BANDAGES/DRESSINGS) ×3 IMPLANT
DURAPREP 26ML APPLICATOR (WOUND CARE) ×2 IMPLANT
ELECT REM PT RETURN 9FT ADLT (ELECTROSURGICAL) ×3
ELECTRODE REM PT RTRN 9FT ADLT (ELECTROSURGICAL) ×1 IMPLANT
GLOVE BIO SURGEON STRL SZ8.5 (GLOVE) ×2 IMPLANT
GLOVE BIOGEL PI IND STRL 6.5 (GLOVE) IMPLANT
GLOVE BIOGEL PI IND STRL 7.0 (GLOVE) IMPLANT
GLOVE BIOGEL PI IND STRL 7.5 (GLOVE) IMPLANT
GLOVE BIOGEL PI IND STRL 9 (GLOVE) ×1 IMPLANT
GLOVE BIOGEL PI INDICATOR 6.5 (GLOVE) ×2
GLOVE BIOGEL PI INDICATOR 7.0 (GLOVE) ×2
GLOVE BIOGEL PI INDICATOR 7.5 (GLOVE) ×2
GLOVE BIOGEL PI INDICATOR 9 (GLOVE) ×2
GLOVE SURG ORTHO 9.0 STRL STRW (GLOVE) ×3 IMPLANT
GOWN STRL REUS W/ TWL XL LVL3 (GOWN DISPOSABLE) ×2 IMPLANT
GOWN STRL REUS W/TWL XL LVL3 (GOWN DISPOSABLE) ×6
KIT BASIN OR (CUSTOM PROCEDURE TRAY) ×3 IMPLANT
KIT DRSG PREVENA PLUS 7DAY 125 (MISCELLANEOUS) ×2 IMPLANT
KIT ROOM TURNOVER OR (KITS) ×3 IMPLANT
MANIFOLD NEPTUNE II (INSTRUMENTS) ×3 IMPLANT
NS IRRIG 1000ML POUR BTL (IV SOLUTION) ×3 IMPLANT
PACK ORTHO EXTREMITY (CUSTOM PROCEDURE TRAY) ×3 IMPLANT
PAD ARMBOARD 7.5X6 YLW CONV (MISCELLANEOUS) ×3 IMPLANT
SPONGE LAP 18X18 X RAY DECT (DISPOSABLE) ×2 IMPLANT
STAPLER VISISTAT 35W (STAPLE) ×2 IMPLANT
STOCKINETTE IMPERVIOUS LG (DRAPES) ×5 IMPLANT
SUT SILK 2 0 (SUTURE) ×3
SUT SILK 2-0 18XBRD TIE 12 (SUTURE) ×1 IMPLANT
SUT VIC AB 1 CTX 27 (SUTURE) ×2 IMPLANT
TOWEL OR 17X26 10 PK STRL BLUE (TOWEL DISPOSABLE) ×3 IMPLANT
TUBE CONNECTING 12'X1/4 (SUCTIONS) ×1
TUBE CONNECTING 12X1/4 (SUCTIONS) ×1 IMPLANT
YANKAUER SUCT BULB TIP NO VENT (SUCTIONS) ×2 IMPLANT

## 2017-04-07 NOTE — Anesthesia Preprocedure Evaluation (Signed)
Anesthesia Evaluation  Patient identified by MRN, date of birth, ID band Patient awake    Reviewed: Allergy & Precautions, NPO status , Patient's Chart, lab work & pertinent test results  Airway Mallampati: II  TM Distance: >3 FB Neck ROM: Full    Dental  (+) Teeth Intact, Dental Advisory Given   Pulmonary sleep apnea and Continuous Positive Airway Pressure Ventilation , Current Smoker,    Pulmonary exam normal breath sounds clear to auscultation       Cardiovascular hypertension, Pt. on medications Normal cardiovascular exam Rhythm:Regular Rate:Normal  ECHO 01/25/17 >The cavity size was normal. Wall thickness wasincreased in a pattern of mild LVH. Systolic function was normal.The estimated ejection fraction was in the range of 60% to 65%.Although no diagnostic regional wall motion abnormality wasidentified, this possibility cannot be completely excluded on the basis of this study. Features are consistent with a pseudonormalleft ventricular filling pattern, with concomitant abnormalrelaxation and increased filling pressure (grade 2 diastolicdysfunction).      Neuro/Psych PSYCHIATRIC DISORDERS Anxiety Depression Bipolar Disorder  Neuromuscular disease    GI/Hepatic Neg liver ROS, GERD  ,  Endo/Other  diabetes, Type 2, Insulin Dependent, Oral Hypoglycemic AgentsHypothyroidism Morbid obesity  Renal/GU Renal InsufficiencyRenal disease     Musculoskeletal left lower extremity cellulitis   Abdominal   Peds  Hematology  (+) Blood dyscrasia, anemia ,   Anesthesia Other Findings Day of surgery medications reviewed with the patient.  Reproductive/Obstetrics                             Anesthesia Physical  Anesthesia Plan  ASA: III  Anesthesia Plan:    Post-op Pain Management:    Induction: Intravenous  PONV Risk Score and Plan: 2 and Ondansetron and Midazolam  Airway Management Planned:  Oral ETT  Additional Equipment:   Intra-op Plan:   Post-operative Plan: Extubation in OR  Informed Consent: I have reviewed the patients History and Physical, chart, labs and discussed the procedure including the risks, benefits and alternatives for the proposed anesthesia with the patient or authorized representative who has indicated his/her understanding and acceptance.   Dental advisory given  Plan Discussed with: CRNA  Anesthesia Plan Comments: (Risks/benefits of general anesthesia discussed with patient including risk of damage to teeth, lips, gum, and tongue, nausea/vomiting, allergic reactions to medications, and the possibility of heart attack, stroke and death.  All patient questions answered.  Patient wishes to proceed.)        Anesthesia Quick Evaluation

## 2017-04-07 NOTE — Anesthesia Postprocedure Evaluation (Signed)
Anesthesia Post Note  Patient: [name] [name]  Procedure(s) Performed: LEFT BELOW KNEE AMPUTATION (Left )     Patient location during evaluation: PACU Anesthesia Type: General Level of consciousness: awake Pain management: pain level controlled Vital Signs Assessment: post-procedure vital signs reviewed and stable Respiratory status: spontaneous breathing Cardiovascular status: stable Anesthetic complications: no    Last Vitals:  Vitals:   04/07/17 1452  BP: (!) 167/93  Pulse: (!) 105  Resp: 18  Temp: 36.9 C  SpO2: 100%    Last Pain:  Vitals:   04/07/17 1506  TempSrc:   PainSc: 7                  Dianna Deshler

## 2017-04-07 NOTE — Op Note (Signed)
   Date of Surgery: 04/07/2017  INDICATIONS: Ms. Givhan is a 41 y.o.-year-old female who has a large necrotic abscess involving the left foot.  Patient is failed conservative therapy with local wound debridement compression wraps oral antibiotics without relief.  Patient presents at this time for transtibial amputation.Marland Kitchen  PREOPERATIVE DIAGNOSIS: Abscess ulceration necrosis left forefoot and midfoot  POSTOPERATIVE DIAGNOSIS: Same.  PROCEDURE: Transtibial amputation Application of Prevena wound VAC  SURGEON: Sharol Given, M.D.  ANESTHESIA:  general  IV FLUIDS AND URINE: See anesthesia.  ESTIMATED BLOOD LOSS: 1000 mL.  COMPLICATIONS: None.  DESCRIPTION OF PROCEDURE: The patient was brought to the operating room and underwent a general anesthetic. After adequate levels of anesthesia were obtained patient's lower extremity was prepped using DuraPrep draped into a sterile field. A timeout was called. The foot was draped out of the sterile field with impervious stockinette. A transverse incision was made 11 cm distal to the tibial tubercle. This curved proximally and a large posterior flap was created. The tibia was transected 1 cm proximal to the skin incision. The fibula was transected just proximal to the tibial incision. The tibia was beveled anteriorly. A large posterior flap was created. The sciatic nerve was pulled cut and allowed to retract. The vascular bundles were suture ligated with 2-0 silk. The deep and superficial fascial layers were closed using #1 Vicryl. The skin was closed using staples and 2-0 nylon. The wound was covered with a Prevena wound VAC. There was a good suction fit. Patient was extubated taken to the PACU in stable condition.  Meridee Score, MD [place] 5:24 PM

## 2017-04-07 NOTE — H&P (Signed)
[name] [name] is an 41 y.o. female.   Chief Complaint: Abscess cellulitis necrosis left foot HPI: Patient is a 41 year old woman who presents in follow-up from the hospital.  Patient was initially seen in the hospital she has an ulcer dorsally over the second toe.  She underwent debridement started on IV antibiotics was placed in compression wraps and was discharged on oral antibiotics with continuation of the compression wraps.  Patient presents at this time complaining of increasing swelling increasing ulceration increasing pain in her left foot.  Initial CT scan and MRI scan were negative for osteomyelitis or abscess.    Past Medical History:  Diagnosis Date  . Abnormal Pap smear of cervix 2009  . Abscess    history of multiple abscesses  . Anemia of chronic disease 2002  . Anxiety   . Bipolar disorder ([place])   . Cellulitis 05/21/2014   right eye  . Chronic bronchitis ([place])    "get it q yr" (05/13/2013)  . Chronic pain   . Depression   . Edema of lower extremity   . Endocarditis 2002   subacute bacterial endocarditis.   . Family history of anesthesia complication    "my mom has a hard time coming out from under"  . Fibromyalgia   . GERD (gastroesophageal reflux disease)   . Heart murmur   . History of blood transfusion    "just low blood count" (05/13/2013)  . Hyperlipidemia   . Hypertension   . Hypothyroidism   . Hypothyroidism, adult 03/21/2014  . Necrosis ([place])    and ulceration  . Obesity   . OSA on CPAP    does not wear CPAP  . Peripheral neuropathy   . Type II diabetes mellitus ([place])     Past Surgical History:  Procedure Laterality Date  . INCISION AND DRAINAGE ABSCESS     multiple I&Ds  . INCISION AND DRAINAGE ABSCESS Left 07/09/2012   Procedure: DRESSING CHANGE, THIGH WOUND;  Surgeon: Harl Bowie, MD;  Location: [facility];  Service: General;  Laterality: Left;  . INCISION AND DRAINAGE OF WOUND Left 07/07/2012   Procedure: IRRIGATION AND DEBRIDEMENT  WOUND;  Surgeon: Harl Bowie, MD;  Location: [facility];  Service: General;  Laterality: Left;  . INCISION AND DRAINAGE PERIRECTAL ABSCESS Left 07/14/2012   Procedure: DEBRIDEMENT OF SKIN & SOFT TISSUE; DRESSING CHANGE UNDER ANESTHESIA;  Surgeon: Gayland Curry, MD,FACS;  Location: [facility];  Service: General;  Laterality: Left;  . INCISION AND DRAINAGE PERIRECTAL ABSCESS Left 07/16/2012   Procedure: I&D Left Thigh;  Surgeon: Gwenyth Ober, MD;  Location: [facility];  Service: General;  Laterality: Left;  . INCISION AND DRAINAGE PERIRECTAL ABSCESS N/A 01/05/2015   Procedure: IRRIGATION AND DEBRIDEMENT PERIRECTAL ABSCESS;  Surgeon: Donnie Mesa, MD;  Location: [facility];  Service: General;  Laterality: N/A;  . IRRIGATION AND DEBRIDEMENT ABSCESS Left 07/06/2012   Procedure: IRRIGATION AND DEBRIDEMENT ABSCESS BUTTOCKS AND THIGH;  Surgeon: Shann Medal, MD;  Location: [facility];  Service: General;  Laterality: Left;  . IRRIGATION AND DEBRIDEMENT ABSCESS Left 08/10/2012   Procedure: IRRIGATION AND DEBRIDEMENT ABSCESS;  Surgeon: Madilyn Hook, DO;  Location: [facility];  Service: General;  Laterality: Left;  . TONSILLECTOMY  1994    Family History  Problem Relation Age of Onset  . Heart failure Mother   . Diabetes Mother   . Kidney disease Mother   . Kidney disease Father   . Diabetes Father   . Diabetes Paternal Grandmother   .  Heart failure Paternal Grandmother   . Other Other    Social History:  reports that she has been smoking cigarettes.  She has a 3.00 pack-year smoking history. she has never used smokeless tobacco. She reports that she drinks alcohol. She reports that she does not use drugs.  Allergies:  Allergies  Allergen Reactions  . Vancomycin Other (See Comments)    Acute renal failure suspected secondary to vanco    No medications prior to admission.    No results found for this or any previous visit (from the past 48 hour(s)). No results found.  Review of Systems  All other systems  reviewed and are negative.   Last menstrual period 04/06/2017. Physical Exam  Patient is alert, oriented, no adenopathy, well-dressed, normal affect, normal respiratory effort. Examination patient has increased swelling to the left lower extremity there is no cellulitis or ulceration in the leg.  She has necrotic skin and soft tissue with extension to the hindfoot of the left foot.  The necrotic ulcer dorsally over the second toe was larger.  There is exposed bone and fascia.  There is no purulent drainage.  Patient does have a good palpable dorsalis pedis pulse no arterial insufficiency she does have venous insufficiency.   Assessment/Plan 1. Necrotizing fasciitis s/p OR debridements     Plan: Discussed with patient she does not have any foot salvage options.  She has ulceration and necrotic tissue that extends up to the ankle she has swelling brawny skin color changes she does not have sufficient soft tissue for a partial foot amputation she will need to proceed with a transtibial amputation.  She is still on 2 oral antibiotics and will plan for surgery tomorrow Friday she will need discharge to skilled nursing.     Newt Minion, MD 04/07/2017, 6:46 AM

## 2017-04-07 NOTE — Transfer of Care (Signed)
Immediate Anesthesia Transfer of Care Note  Patient: [name] [name]  Procedure(s) Performed: LEFT BELOW KNEE AMPUTATION (Left )  Patient Location: PACU  Anesthesia Type:General  Level of Consciousness: awake, alert  and patient cooperative  Airway & Oxygen Therapy: Patient Spontanous Breathing and Patient connected to nasal cannula oxygen  Post-op Assessment: Report given to RN and Post -op Vital signs reviewed and stable  Post vital signs: Reviewed and stable  Last Vitals:  Vitals:   04/07/17 1452  BP: (!) 167/93  Pulse: (!) 105  Resp: 18  Temp: 36.9 C  SpO2: 100%    Last Pain:  Vitals:   04/07/17 1506  TempSrc:   PainSc: 7          Complications: No apparent anesthesia complications

## 2017-04-07 NOTE — Anesthesia Procedure Notes (Signed)
Procedure Name: Intubation Date/Time: 04/07/2017 4:28 PM Performed by: White, Amedeo Plenty, CRNA Pre-anesthesia Checklist: Patient identified, Emergency Drugs available, Suction available and Patient being monitored Patient Re-evaluated:Patient Re-evaluated prior to induction Oxygen Delivery Method: Circle System Utilized Preoxygenation: Pre-oxygenation with 100% oxygen Induction Type: IV induction Ventilation: Mask ventilation without difficulty Laryngoscope Size: Mac and 3 Grade View: Grade III Tube type: Oral Tube size: 7.0 mm Number of attempts: 1 Airway Equipment and Method: Stylet and Oral airway Placement Confirmation: ETT inserted through vocal cords under direct vision,  positive ETCO2 and breath sounds checked- equal and bilateral Secured at: 22 cm Tube secured with: Tape Dental Injury: Teeth and Oropharynx as per pre-operative assessment

## 2017-04-08 ENCOUNTER — Encounter (HOSPITAL_COMMUNITY): Payer: Self-pay | Admitting: Orthopedic Surgery

## 2017-04-08 LAB — GLUCOSE, CAPILLARY
GLUCOSE-CAPILLARY: 141 mg/dL — AB (ref 65–99)
GLUCOSE-CAPILLARY: 178 mg/dL — AB (ref 65–99)
Glucose-Capillary: 128 mg/dL — ABNORMAL HIGH (ref 65–99)
Glucose-Capillary: 101 mg/dL — ABNORMAL HIGH (ref 65–99)

## 2017-04-08 NOTE — Evaluation (Signed)
Physical Therapy Evaluation Patient Details Name: [name] [name] MRN: [id number] DOB: Sep 13, 1975 Today's Date: 04/08/2017   History of Present Illness  Pt is a 41 y/o femalewith PMH significant ofnecrotizing fasciitis of the groin,diabetic neuropathy, T2DM, HTN, OSA on CPAP, CKD stage 2-3 and Obesity,chronic back pain and bipolar disorder. Pt with recent admission for severe sepsis secondary to left lower extremity cellulitis d/t L foot second  toe ulcer. She is now s/p L BKA on 04/07/17.  Clinical Impression  Pt admitted with above diagnosis. Pt currently with functional limitations due to the deficits listed below (see PT Problem List). At the time of PT eval pt was able to perform transfers and pre-gait activity EOB with +2 mod assist for balance support and safety. Feel this patient will progress well and thrive in the right therapy environment. Recommending follow-up therapy at the CIR level to maximize functional independence and safety with mobility. Pt is motivated to work with therapy and is planning on a prosthesis when she is able. Pt will benefit from skilled PT to increase their independence and safety with mobility to allow discharge to the venue listed below.     Follow Up Recommendations CIR;Supervision/Assistance - 24 hour    Equipment Recommendations  Wheelchair (measurements PT);Wheelchair cushion (measurements PT);3in1 (PT)(wide RW delivered to room)    Recommendations for Other Services Rehab consult     Precautions / Restrictions Precautions Precautions: Fall Restrictions Weight Bearing Restrictions: Yes LLE Weight Bearing: Non weight bearing      Mobility  Bed Mobility Overal bed mobility: Needs Assistance Bed Mobility: Supine to Sit     Supine to sit: Mod assist Sit to supine: Mod assist   General bed mobility comments: Assist for LLE initiation of movement. Pt very painful with any movement.   Transfers Overall transfer level: Needs  assistance Equipment used: Rolling walker (2 wheeled) Transfers: Sit to/from Stand Sit to Stand: Mod assist;+2 physical assistance;From elevated surface         General transfer comment: VC's for hand placement on seated surface for safety. Pt was able to power-up to full stand with R foot blocked, and +2 mod assist for balance support and safety. Pt was able to maintain upright posture ~2 minutes on walker. She was able to maintain balance to reach back with R hand for the bed prior to initiating seated rest break.   Ambulation/Gait Ambulation/Gait assistance: Min assist;+2 physical assistance;+2 safety/equipment Ambulation Distance (Feet): 2 Feet Assistive device: Rolling walker (2 wheeled)   Gait velocity: Decreased Gait velocity interpretation: Below normal speed for age/gender General Gait Details: Pt was able to laterally scoot her foot (toes then heel) for ~2 feet with heavy support on walker.   Stairs            Wheelchair Mobility    Modified Rankin (Stroke Patients Only)       Balance Overall balance assessment: Needs assistance Sitting-balance support: Feet supported;No upper extremity supported Sitting balance-Leahy Scale: Fair     Standing balance support: Single extremity supported;During functional activity Standing balance-Leahy Scale: Poor Standing balance comment: Requires at least one UE support on walker to maintain standing balance.                              Pertinent Vitals/Pain Pain Assessment: 0-10 Pain Score: 10-Worst pain ever Faces Pain Scale: Hurts little more Pain Location: left foot Pain Descriptors / Indicators: Discomfort;Grimacing Pain Intervention(s): Monitored during session  Home Living Family/patient expects to be discharged to:: Private residence Living Arrangements: Parent Available Help at Discharge: Family Type of Home: House Home Access: Stairs to enter Entrance Stairs-Rails: Counsellor of Steps: 3 Home Layout: One level Home Equipment: None      Prior Function Level of Independence: Independent               Hand Dominance        Extremity/Trunk Assessment   Upper Extremity Assessment Upper Extremity Assessment: Overall WFL for tasks assessed    Lower Extremity Assessment Lower Extremity Assessment: LLE deficits/detail LLE Deficits / Details: Acute pain and decreased strength/AROM 2 above mentioned procedure. LLE Sensation: (Phantom pain/sensation of lower leg)    Cervical / Trunk Assessment Cervical / Trunk Assessment: Normal  Communication   Communication: No difficulties  Cognition Arousal/Alertness: Awake/alert Behavior During Therapy: WFL for tasks assessed/performed Overall Cognitive Status: Within Functional Limits for tasks assessed                                        General Comments      Exercises     Assessment/Plan    PT Assessment Patient needs continued PT services  PT Problem List Decreased strength;Decreased range of motion;Decreased activity tolerance;Decreased mobility;Pain;Obesity       PT Treatment Interventions DME instruction;Gait training;Functional mobility training;Therapeutic activities;Stair training;Patient/family education    PT Goals (Current goals can be found in the Care Plan section)  Acute Rehab PT Goals Patient Stated Goal: Go to rehab to learn how to take care of herself PT Goal Formulation: With patient Time For Goal Achievement: 04/22/17 Potential to Achieve Goals: Fair    Frequency Min 3X/week   Barriers to discharge        Co-evaluation               AM-PAC PT "6 Clicks" Daily Activity  Outcome Measure Difficulty turning over in bed (including adjusting bedclothes, sheets and blankets)?: A Little Difficulty moving from lying on back to sitting on the side of the bed? : A Little Difficulty sitting down on and standing up from a chair with arms  (e.g., wheelchair, bedside commode, etc,.)?: A Little Help needed moving to and from a bed to chair (including a wheelchair)?: A Little Help needed walking in hospital room?: A Little Help needed climbing 3-5 steps with a railing? : A Lot 6 Click Score: 17    End of Session Equipment Utilized During Treatment: Gait belt Activity Tolerance: Patient limited by pain Patient left: in chair;with call bell/phone within reach Nurse Communication: Mobility status PT Visit Diagnosis: Pain;Unsteadiness on feet (R26.81) Pain - Right/Left: Left Pain - part of body: Ankle and joints of foot    Time: 1159-1230 PT Time Calculation (min) (ACUTE ONLY): 31 min   Charges:   PT Evaluation $PT Eval Moderate Complexity: 1 Mod PT Treatments $Gait Training: 8-22 mins   PT G Codes:        Rolinda Roan, PT, DPT Acute Rehabilitation Services Pager: [phone]   Thelma Comp 04/08/2017, 1:44 PM

## 2017-04-08 NOTE — Progress Notes (Signed)
Inpatient Rehabilitation  Per PT request, patient was screened by Chasitty Hehl for appropriateness for an Inpatient Acute Rehab consult.  At this time we are recommending an Inpatient Rehab consult.  Please order if you are agreeable.    Kosha Jaquith, M.A., CCC/SLP Admission Coordinator   Inpatient Rehabilitation  Cell [phone]  

## 2017-04-08 NOTE — Progress Notes (Signed)
Subjective: Patient stable.  Wound VAC in place.   Objective: Vital signs in last 24 hours: Temp:  [97.2 F (36.2 C)-98.5 F (36.9 C)] 98 F (36.7 C) (11/17 0500) Pulse Rate:  [92-111] 105 (11/17 0500) Resp:  [6-23] 13 (11/17 0500) BP: (147-175)/(79-115) 165/79 (11/17 0500) SpO2:  [91 %-100 %] 91 % (11/17 0500) Weight:  [351 lb 13.7 oz (159.6 kg)] 351 lb 13.7 oz (159.6 kg) (11/16 1452)  Intake/Output from previous day: 11/16 0701 - 11/17 0700 In: 1500 [I.V.:1000; IV Piggyback:500] Out: 1000 [Blood:1000] Intake/Output this shift: No intake/output data recorded.  Exam:  No cellulitis present  Labs: Recent Labs    04/07/17 1527  HGB 10.2*   Recent Labs    04/07/17 1527  HCT 30.0*   Recent Labs    04/07/17 1527  NA 142  K 4.7  GLUCOSE 110*   No results for input(s): LABPT, INR in the last 72 hours.  Assessment/Plan: Plan at this time is to continue wound VAC and anticipate discharge Monday   Anderson Malta 04/08/2017, 9:19 AM

## 2017-04-09 ENCOUNTER — Other Ambulatory Visit: Payer: Self-pay

## 2017-04-09 LAB — GLUCOSE, CAPILLARY
GLUCOSE-CAPILLARY: 159 mg/dL — AB (ref 65–99)
Glucose-Capillary: 123 mg/dL — ABNORMAL HIGH (ref 65–99)
Glucose-Capillary: 150 mg/dL — ABNORMAL HIGH (ref 65–99)
Glucose-Capillary: 157 mg/dL — ABNORMAL HIGH (ref 65–99)

## 2017-04-09 NOTE — Progress Notes (Signed)
Physical Therapy Treatment Patient Details Name: [name] [name] MRN: [id number] DOB: [birth year] Today's Date: 04/09/2017    History of Present Illness Pt is a 41 y/o femalewith PMH significant ofnecrotizing fasciitis of the groin,diabetic neuropathy, T2DM, HTN, OSA on CPAP, CKD stage 2-3 and Obesity,chronic back pain and bipolar disorder. Pt with recent admission for severe sepsis secondary to left lower extremity cellulitis d/t L foot second  toe ulcer. She is now s/p L BKA on 04/07/17.    PT Comments    Pt very motivated & willing to participate in PT session.  Completed bed mobility with min guard by elevating HOB ~90 degrees + bed rail.  Requires +2 mod assist for sit/stand transfers but was able to take ~4 + 6 steps forwards with min assist +2 for safety.  Cont to strongly recommend CIR to maximize her functional mobility prior to d/c home.      Follow Up Recommendations  CIR;Supervision/Assistance - 24 hour     Equipment Recommendations  Wheelchair (measurements PT);Wheelchair cushion (measurements PT);3in1 (PT)    Recommendations for Other Services Rehab consult     Precautions / Restrictions Precautions Precautions: Fall Restrictions LLE Weight Bearing: Non weight bearing    Mobility  Bed Mobility Overal bed mobility: Needs Assistance Bed Mobility: Supine to Sit     Supine to sit: Min guard;HOB elevated(HOB elevated 90 degrees)        Transfers Overall transfer level: Needs assistance Equipment used: Rolling walker (2 wheeled) Transfers: Sit to/from Stand Sit to Stand: Mod assist;+2 physical assistance         General transfer comment: cues for hand placement & Rt foot placement.  (A) to achieve standing and stabilize with standing.  Completed from bed & recliner requiring increased assistance from lower surface (recliner)  Ambulation/Gait Ambulation/Gait assistance: Min assist;+2 safety/equipment Ambulation Distance (Feet): (4 hops + 6  hops) Assistive device: Rolling walker (2 wheeled) Gait Pattern/deviations: Step-to pattern Gait velocity: Decreased   General Gait Details: fatigues quickly.  difficulty clearing floor with Rt foot but able to take small hops forwards.     Stairs            Wheelchair Mobility    Modified Rankin (Stroke Patients Only)       Balance                                            Cognition Arousal/Alertness: Awake/alert Behavior During Therapy: WFL for tasks assessed/performed Overall Cognitive Status: Within Functional Limits for tasks assessed                                        Exercises      General Comments        Pertinent Vitals/Pain Pain Assessment: 0-10 Pain Score: 8  Pain Location: left leg/phantom pain Pain Descriptors / Indicators: Grimacing;Guarding;Pressure;Shooting;Constant;Sharp(phantom pain) Pain Intervention(s): Monitored during session;Repositioned;RN gave pain meds during session    Home Living                      Prior Function            PT Goals (current goals can now be found in the care plan section) Acute Rehab PT Goals Patient Stated Goal: Go to rehab to learn how  to take care of herself PT Goal Formulation: With patient Time For Goal Achievement: 04/22/17 Potential to Achieve Goals: Fair    Frequency    Min 3X/week      PT Plan Current plan remains appropriate    Co-evaluation              AM-PAC PT "6 Clicks" Daily Activity  Outcome Measure                   End of Session Equipment Utilized During Treatment: Gait belt Activity Tolerance: Patient limited by fatigue;Patient limited by pain Patient left: in chair;with call bell/phone within reach;with family/visitor present;with nursing/sitter in room Nurse Communication: Mobility status       Time: 7939-0300 PT Time Calculation (min) (ACUTE ONLY): 25 min  Charges:  $Gait Training: 8-22  mins $Therapeutic Activity: 8-22 mins                    G CodesSarajane Marek, [place] [phone] 04/09/2017

## 2017-04-09 NOTE — Progress Notes (Signed)
Patient ID: [name] [name], female   DOB: [birth year], 41 y.o.   MRN: [id number] Told patient to work on leg extension, [facility], possible discharge to CIR

## 2017-04-09 NOTE — Progress Notes (Signed)
Subjective: Pain controlled.  Patient sitting in chair.  Wound VAC functional.   Objective: Vital signs in last 24 hours: Temp:  [98 F (36.7 C)-99.8 F (37.7 C)] 98 F (36.7 C) (11/18 0440) Pulse Rate:  [102-106] 102 (11/18 0440) Resp:  [15-18] 16 (11/18 0440) BP: (124-164)/(60-90) 137/81 (11/18 0440) SpO2:  [95 %-100 %] 99 % (11/18 0440)  Intake/Output from previous day: 11/17 0701 - 11/18 0700 In: 1040 [P.O.:960; I.V.:80] Out: 850 [Urine:850] Intake/Output this shift: Total I/O In: -  Out: 500 [Urine:500]  Exam:  No cellulitis present  Labs: Recent Labs    04/07/17 1527  HGB 10.2*   Recent Labs    04/07/17 1527  HCT 30.0*   Recent Labs    04/07/17 1527  NA 142  K 4.7  GLUCOSE 110*   No results for input(s): LABPT, INR in the last 72 hours.  Assessment/Plan: Plan is for possible discharge to home tomorrow.  Patient is holding out hope that inpatient rehabilitation may be a possibility but I think that is unlikely.   [name] [name] 04/09/2017, 10:06 AM

## 2017-04-09 NOTE — Progress Notes (Signed)
Orthopedic Tech Progress Note Patient Details:  [name] [name] 01/02/1976 [id number]  Patient ID: [name] [name], female   DOB: Jan 02, 1976, 41 y.o.   MRN: [id number]   [name] [name] 04/09/2017, 1:33 PMCalled [facility] for left stump shrinnker.

## 2017-04-10 DIAGNOSIS — G4733 Obstructive sleep apnea (adult) (pediatric): Secondary | ICD-10-CM

## 2017-04-10 DIAGNOSIS — G8918 Other acute postprocedural pain: Secondary | ICD-10-CM

## 2017-04-10 DIAGNOSIS — S88112D Complete traumatic amputation at level between knee and ankle, left lower leg, subsequent encounter: Secondary | ICD-10-CM

## 2017-04-10 DIAGNOSIS — I10 Essential (primary) hypertension: Secondary | ICD-10-CM

## 2017-04-10 DIAGNOSIS — E669 Obesity, unspecified: Secondary | ICD-10-CM

## 2017-04-10 DIAGNOSIS — D72829 Elevated white blood cell count, unspecified: Secondary | ICD-10-CM

## 2017-04-10 DIAGNOSIS — F319 Bipolar disorder, unspecified: Secondary | ICD-10-CM

## 2017-04-10 DIAGNOSIS — E1142 Type 2 diabetes mellitus with diabetic polyneuropathy: Secondary | ICD-10-CM

## 2017-04-10 DIAGNOSIS — Z89512 Acquired absence of left leg below knee: Secondary | ICD-10-CM

## 2017-04-10 DIAGNOSIS — D62 Acute posthemorrhagic anemia: Secondary | ICD-10-CM

## 2017-04-10 LAB — GLUCOSE, CAPILLARY
GLUCOSE-CAPILLARY: 187 mg/dL — AB (ref 65–99)
GLUCOSE-CAPILLARY: 109 mg/dL — AB (ref 65–99)
GLUCOSE-CAPILLARY: 217 mg/dL — AB (ref 65–99)
Glucose-Capillary: 147 mg/dL — ABNORMAL HIGH (ref 65–99)

## 2017-04-10 LAB — I-STAT BETA HCG BLOOD, ED (NOT ORDERABLE)

## 2017-04-10 MED ORDER — OXYCODONE-ACETAMINOPHEN 5-325 MG PO TABS: 1.0000 | ORAL_TABLET | ORAL | 0 refills | Status: DC | PRN

## 2017-04-10 NOTE — Progress Notes (Signed)
Inpatient Rehabilitation   Met with patient to discuss team's recommendation for IP Rehab.  Shared booklets, insurance verification letter, and answered questions.  Patient is eager to regain her independence and assume role of caring for her mom again, until that point she has family to support and assist them as needed.  Plan to follow along for timing of medical readiness (needs to be off IV pain meds) and IP Rehab bed availability.  Plan to follow up tomorrow in hopes of a potential IP Rehab admission.  Call if questions.    , M.A., CCC/SLP Admission Coordinator  [facility]  Cell [phone]  

## 2017-04-10 NOTE — Progress Notes (Signed)
Patient ID: [name] [name], female   DOB: 11-01-1975, 41 y.o.   MRN: [id number] Wound VAC dry, plan for D/C to CIR if approved by insurance

## 2017-04-10 NOTE — H&P (Signed)
Physical Medicine and Rehabilitation Admission H&P    Chief complaint: Stump pain  HPI: [name] [name] is a 41 y.o. right handed female with history of bipolar disorder, diabetes mellitus peripheral neuropathy, hypertension, OSA on CPAP, CKD stage III, morbid obesity as well as necrotizing fasciitis of the groin and debridement of left second toe ulcer. Recent admission 03/24/2017- 04/02/2017 for severe sepsis secondary to left lower extremity cellulitis. Per chart review and patient, patient lives with her disabled mother who has an amputation, uncle and nephew. 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 Presented 04/07/2017 with decreasing ischemic changes of left lower extremity as well as necrotic abscess. Wound was not felt to be salvageable and underwent transtibial amputation 04/07/2017 with placement of wound VAC per Dr. Sharol Given. Hospital course pain management. MRSA PCR screening positive maintained on contact precautions. Physical therapy evaluation completed with recommendations of physical medicine rehabilitation consult. Patient was admitted for a comprehensive rehabilitation program    Review of Systems  Constitutional: Negative for chills and fever.  HENT: Negative for hearing loss.   Eyes: Negative for blurred vision.  Respiratory: Positive for shortness of breath.   Cardiovascular: Positive for leg swelling. Negative for chest pain and palpitations.  Gastrointestinal: Positive for constipation. Negative for nausea and vomiting.       GERD  Genitourinary: Negative for dysuria, flank pain and hematuria.  Musculoskeletal: Positive for back pain, joint pain and myalgias.  Skin: Negative for rash.  Neurological: Positive for weakness.  Psychiatric/Behavioral: Positive for depression.       Anxiety, bipolar disorder  All other systems reviewed and are negative.  Past Medical History:  Diagnosis Date  . Abnormal Pap smear of cervix 2009  . Abscess    history of multiple  abscesses  . Anemia of chronic disease 2002  . Anxiety   . Bipolar disorder ([place])   . Cellulitis 05/21/2014   right eye  . Chronic bronchitis ([place])    "get it q yr" (05/13/2013)  . Chronic pain   . Depression   . Edema of lower extremity   . Endocarditis 2002   subacute bacterial endocarditis.   . Family history of anesthesia complication    "my mom has a hard time coming out from under"  . Fibromyalgia   . GERD (gastroesophageal reflux disease)   . Heart murmur   . History of blood transfusion    "just low blood count" (05/13/2013)  . Hyperlipidemia   . Hypertension   . Hypothyroidism   . Hypothyroidism, adult 03/21/2014  . Necrosis ([place])    and ulceration  . Obesity   . OSA on CPAP    does not wear CPAP  . Peripheral neuropathy   . Type II diabetes mellitus ([place])    Past Surgical History:  Procedure Laterality Date  . DEBRIDEMENT OF SKIN & SOFT TISSUE; DRESSING CHANGE UNDER ANESTHESIA Left 07/14/2012   Performed by Gayland Curry, MD,FACS at [facility], THIGH WOUND Left 07/09/2012   Performed by Harl Bowie, MD at [place]  . I&D Left Thigh Left 07/16/2012   Performed by Gwenyth Ober, MD at [facility] OR  . INCISION AND DRAINAGE ABSCESS     multiple I&Ds  . IRRIGATION AND DEBRIDEMENT ABSCESS Left 08/10/2012   Performed by Madilyn Hook, DO at [place]  . IRRIGATION AND DEBRIDEMENT ABSCESS BUTTOCKS AND THIGH Left 07/06/2012   Performed by Shann Medal, MD at [place]  . IRRIGATION AND  DEBRIDEMENT PERIRECTAL ABSCESS N/A 01/05/2015   Performed by Donnie Mesa, MD at [facility] OR  . IRRIGATION AND DEBRIDEMENT WOUND Left 07/07/2012   Performed by Harl Bowie, MD at [facility]  . LEFT BELOW KNEE AMPUTATION Left 04/07/2017   Performed by Newt Minion, MD at [facility]  . TONSILLECTOMY  1994   Family History  Problem Relation Age of Onset  . Heart failure Mother   . Diabetes Mother   . Kidney disease Mother   . Kidney disease Father   . Diabetes Father   .  Diabetes Paternal Grandmother   . Heart failure Paternal Grandmother   . Other Other    Social History:  reports that she has been smoking cigarettes.  She has a 3.00 pack-year smoking history. she has never used smokeless tobacco. She reports that she drinks alcohol. She reports that she does not use drugs. Allergies:  Allergies  Allergen Reactions  . Vancomycin Other (See Comments)    Acute renal failure suspected secondary to vanco   Medications Prior to Admission  Medication Sig Dispense Refill  . amitriptyline (ELAVIL) 50 MG tablet Take 1 tablet (50 mg total) by mouth at bedtime. 90 tablet 3  . amoxicillin-clavulanate (AUGMENTIN) 875-125 MG tablet Take 1 tablet every 12 (twelve) hours by mouth. 12 tablet 0  . atorvastatin (LIPITOR) 20 MG tablet TAKE 1 TABLET BY MOUTH EVERY DAY 30 tablet 11  . cyclobenzaprine (FLEXERIL) 10 MG tablet Take 1 tablet (10 mg total) by mouth every 12 (twelve) hours as needed for muscle spasms. 60 tablet 3  . doxycycline (VIBRA-TABS) 100 MG tablet Take 1 tablet (100 mg total) every 12 (twelve) hours by mouth. 12 tablet 0  . furosemide (LASIX) 20 MG tablet Take 2 tablets (40 mg total) daily by mouth. 30 tablet 0  . HYDROcodone-acetaminophen (NORCO/VICODIN) 5-325 MG tablet Take 1-2 tablets every 4 (four) hours as needed by mouth for moderate pain. 10 tablet 0  . insulin glargine (LANTUS) 100 UNIT/ML injection Inject 44 units below the skin at bedtime. Diagnosis code:  E11.65, Z79.4 30 mL 1  . INVOKANA 300 MG TABS tablet Take 1 tablet by mouth daily before breakfast 30 tablet 3  . liraglutide (VICTOZA) 18 MG/3ML SOPN Inject 0.3 mLs (1.8 mg total) daily into the skin. 9 mL 3  . LYRICA 100 MG capsule TAKE 1 CAPSULE BY MOUTH 2 TIMES DAILY 60 capsule 0  . mupirocin ointment (BACTROBAN) 2 % Place 1 application 2 (two) times daily into the nose. 22 g 0  . NOVOLOG FLEXPEN 100 UNIT/ML FlexPen Inject 14 units below the skin 3 times daily with meals 15 mL 1  . prednisoLONE  acetate (PRED FORTE) 1 % ophthalmic suspension Place 1 drop into the right eye 4 (four) times daily. 10 mL 0  . VOLTAREN 1 % GEL Apply 4 grams to affected area (topically) 4 times daily 100 g 2  . ACCU-CHEK FASTCLIX LANCETS MISC Check blood sugar 3 times a day before meals 102 each 12  . Blood Glucose Monitoring Suppl (ACCU-CHEK AVIVA PLUS) w/Device KIT 1 each by Does not apply route 4 (four) times daily. 1 kit 1  . glucose blood (ACCU-CHEK AVIVA PLUS) test strip Check blood sugar 4 times a day before meals 150 each 3  . Insulin Pen Needle (B-D UF III MINI PEN NEEDLES) 31G X 5 MM MISC Use 3 per day to inject Insulin 100 each 5  . Insulin Syringe-Needle U-100 31G X 15/64" 0.5 ML  MISC Use to inject insulin as isntructed 100 each 6    Drug Regimen Review Drug regimen was reviewed and remains appropriate with no significant issues identified  Home: Home Living Family/patient expects to be discharged to:: Private residence Living Arrangements: Parent Available Help at Discharge: Family Type of Home: House Home Access: Stairs to enter Technical brewer of Steps: 3 Entrance Stairs-Rails: Right, Left Home Layout: One level Bathroom Shower/Tub: Chiropodist: Standard Home Equipment: None   Functional History: Prior Function Level of Independence: Independent  Functional Status:  Mobility: Bed Mobility Overal bed mobility: Needs Assistance Bed Mobility: Supine to Sit Supine to sit: Min guard, HOB elevated(HOB elevated 90 degrees) Sit to supine: Mod assist General bed mobility comments: Assist for LLE initiation of movement. Pt very painful with any movement.  Transfers Overall transfer level: Needs assistance Equipment used: Rolling walker (2 wheeled) Transfers: Sit to/from Stand Sit to Stand: Mod assist, +2 physical assistance General transfer comment: cues for hand placement & Rt foot placement.  (A) to achieve standing and stabilize with standing.   Completed from bed & recliner requiring increased assistance from lower surface (recliner) Ambulation/Gait Ambulation/Gait assistance: Min assist, +2 safety/equipment Ambulation Distance (Feet): (4 hops + 6 hops) Assistive device: Rolling walker (2 wheeled) Gait Pattern/deviations: Step-to pattern General Gait Details: fatigues quickly.  difficulty clearing floor with Rt foot but able to take small hops forwards.   Gait velocity: Decreased Gait velocity interpretation: Below normal speed for age/gender    ADL:    Cognition: Cognition Overall Cognitive Status: Within Functional Limits for tasks assessed Orientation Level: Oriented X4 Cognition Arousal/Alertness: Awake/alert Behavior During Therapy: WFL for tasks assessed/performed Overall Cognitive Status: Within Functional Limits for tasks assessed  Physical Exam: Blood pressure (!) 143/77, pulse (!) 106, temperature 99 F (37.2 C), temperature source Oral, resp. rate 16, height '5\' 10"'  (1.778 m), weight (!) 159.6 kg (351 lb 13.7 oz), last menstrual period 04/06/2017, SpO2 98 %. Physical Exam  Vitals reviewed. Constitutional:  41 year old right-handed obese female  HENT:  Head: Normocephalic.  Eyes: EOM are normal.  Neck: Normal range of motion. Neck supple. No thyromegaly present.  Cardiovascular: Normal rate, regular rhythm and normal heart sounds. Exam reveals no friction rub.  No murmur heard. Respiratory: Breath sounds normal. No respiratory distress. She has no wheezes.  GI: Soft. Bowel sounds are normal. She exhibits no distension. There is no tenderness. There is no rebound.  Musculoskeletal: She exhibits edema (stump).  Psychiatric: She has a normal mood and affect. Her behavior is normal.  Skin. Wound VAC in place to left lower extremity. Right foot dry, intact Neurological: She is alert and oriented to person, place, and time.  Motor: B/l UE/RLE 5/5 proximal to distal LLE: HF 4/5 (pain inhibition  present) Sensation intact to light touch     Results for orders placed or performed during the hospital encounter of 04/07/17 (from the past 48 hour(s))  Glucose, capillary     Status: Abnormal   Collection Time: 04/08/17 12:52 PM  Result Value Ref Range   Glucose-Capillary 101 (H) 65 - 99 mg/dL  Glucose, capillary     Status: Abnormal   Collection Time: 04/08/17  4:48 PM  Result Value Ref Range   Glucose-Capillary 141 (H) 65 - 99 mg/dL  Glucose, capillary     Status: Abnormal   Collection Time: 04/08/17  9:12 PM  Result Value Ref Range   Glucose-Capillary 178 (H) 65 - 99 mg/dL  Glucose, capillary  Status: Abnormal   Collection Time: 04/09/17  6:15 AM  Result Value Ref Range   Glucose-Capillary 123 (H) 65 - 99 mg/dL  Glucose, capillary     Status: Abnormal   Collection Time: 04/09/17 11:26 AM  Result Value Ref Range   Glucose-Capillary 150 (H) 65 - 99 mg/dL  Glucose, capillary     Status: Abnormal   Collection Time: 04/09/17  5:26 PM  Result Value Ref Range   Glucose-Capillary 159 (H) 65 - 99 mg/dL  Glucose, capillary     Status: Abnormal   Collection Time: 04/09/17  9:44 PM  Result Value Ref Range   Glucose-Capillary 157 (H) 65 - 99 mg/dL  Glucose, capillary     Status: Abnormal   Collection Time: 04/10/17  7:02 AM  Result Value Ref Range   Glucose-Capillary 187 (H) 65 - 99 mg/dL   No results found.     Medical Problem List and Plan: 1.  Decreased functional mobility secondary to left transtibial amputation 04/07/2017  -admit to inpatient rehab 2.  DVT Prophylaxis/Anticoagulation: SCD right lower extremity 3. Pain Management:  Lyrica 100 mg twice a day, oxycodone and Robaxin as needed 4. Mood: Elavil 50 mg daily at bedtime 5. Neuropsych: This patient is capable of making decisions on her own behalf. 6. Skin/Wound Care: Routine skin checks 7. Fluids/Electrolytes/Nutrition: Routine I&O's with follow-up chemistries 8. MRSA PCR screening positive. Contact  precautions 9. Diabetes mellitus and peripheral neuropathy. Hemoglobin A1c 9.0. NovoLog 6 units 3 times a day, Lantus insulin 30 units daily at bedtime. Diabetic teaching 10. CKD stage III. Continue low dose Lasix. Follow-up chemistries 11. Hypertension. Monitor with increased mobility 12. MRSA PCR screening positive. Contact precautions 13. Constipation. Laxative assistance 14. Hyperlipidemia. Lipitor   Post Admission Physician Evaluation: 1. Functional deficits secondary  to left BKA. 2. Patient is admitted to receive collaborative, interdisciplinary care between the physiatrist, rehab nursing staff, and therapy team. 3. Patient's level of medical complexity and substantial therapy needs in context of that medical necessity cannot be provided at a lesser intensity of care such as a SNF. 4. Patient has experienced substantial functional loss from his/her baseline which was documented above under the "Functional History" and "Functional Status" headings.  Judging by the patient's diagnosis, physical exam, and functional history, the patient has potential for functional progress which will result in measurable gains while on inpatient rehab.  These gains will be of substantial and practical use upon discharge  in facilitating mobility and self-care at the household level. 5. Physiatrist will provide 24 hour management of medical needs as well as oversight of the therapy plan/treatment and provide guidance as appropriate regarding the interaction of the two. 6. The Preadmission Screening has been reviewed and patient status is unchanged unless otherwise stated above. 7. 24 hour rehab nursing will assist with bladder management, bowel management, safety, skin/wound care, disease management, medication administration, pain management and patient education  and help integrate therapy concepts, techniques,education, etc. 8. PT will assess and treat for/with: Lower extremity strength, range of motion,  stamina, balance, functional mobility, safety, adaptive techniques and equipment, pre-prosthetic education, pain control, wound/vac mgt.   Goals are: supervision to mod I assist 9. OT will assess and treat for/with: ADL's, functional mobility, safety, upper extremity strength, adaptive techniques and equipment, pain mgt, pre-prosthetic mgt.   Goals are: supervision to mod I assist. Therapy may not yet proceed with showering this patient. 10. SLP will assess and treat for/with: n/a.  Goals are: n/a. 11. Case Management  and Education officer, museum will assess and treat for psychological issues and discharge planning. 12. Team conference will be held weekly to assess progress toward goals and to determine barriers to discharge. 13. Patient will receive at least 3 hours of therapy per day at least 5 days per week. 14. ELOS: 14-18 days       15. Prognosis:  excellent     Meredith Staggers, MD, [facility] 04/11/2017  Lavon Paganini Bell, PA-C 04/10/2017

## 2017-04-10 NOTE — Progress Notes (Signed)
Physical Therapy Treatment Patient Details Name: [name] [name] MRN: [id number] DOB: 03/29/1976 Today's Date: 04/10/2017    History of Present Illness Pt is a 41 y/o femalewith PMH significant ofnecrotizing fasciitis of the groin,diabetic neuropathy, T2DM, HTN, OSA on CPAP, CKD stage 2-3 and Obesity,chronic back pain and bipolar disorder. Pt with recent admission for severe sepsis secondary to left lower extremity cellulitis d/t L foot second  toe ulcer. She is now s/p L BKA on 04/07/17.    PT Comments    Patient is making progress toward mobility goals. Pt given HEP handout and reviewed. Current plan remains appropriate.    Follow Up Recommendations  CIR;Supervision/Assistance - 24 hour     Equipment Recommendations  Wheelchair (measurements PT);Wheelchair cushion (measurements PT);3in1 (PT)    Recommendations for Other Services Rehab consult     Precautions / Restrictions Precautions Precautions: Fall Precaution Comments: left foot wound Restrictions Weight Bearing Restrictions: Yes LLE Weight Bearing: Non weight bearing    Mobility  Bed Mobility               General bed mobility comments: pt on BSC with NT present upon arrival  Transfers Overall transfer level: Needs assistance Equipment used: Rolling walker (2 wheeled) Transfers: Sit to/from [facility] Sit to Stand: Mod assist;+2 physical assistance Stand pivot transfers: Min assist;+2 safety/equipment       General transfer comment: cues for safe hand placement and R foot position; assist to power up into standing and to steady while transitioning hand placement and to manage RW when pivoting on R foot toward recliner; pt unable to clear floor with R foot   Ambulation/Gait                 Stairs            Wheelchair Mobility    Modified Rankin (Stroke Patients Only)       Balance Overall balance assessment: Needs assistance Sitting-balance support: Feet  supported;No upper extremity supported Sitting balance-Leahy Scale: Fair       Standing balance-Leahy Scale: Poor                              Cognition Arousal/Alertness: Awake/alert Behavior During Therapy: WFL for tasks assessed/performed Overall Cognitive Status: Within Functional Limits for tasks assessed                                        Exercises Amputee Exercises Quad Sets: AROM;Both;10 reps Towel Squeeze: AROM;10 reps Hip ABduction/ADduction: AROM;Left;5 reps Knee Extension: AROM;Left;10 reps;Seated Straight Leg Raises: AROM;Left;5 reps    General Comments General comments (skin integrity, edema, etc.): pt given HEP handout and reviewed and positioning reviewed as well      Pertinent Vitals/Pain Pain Assessment: Faces Faces Pain Scale: Hurts even more Pain Location: L LE Pain Descriptors / Indicators: Grimacing;Guarding;Pressure;Moaning;Squeezing Pain Intervention(s): Limited activity within patient's tolerance;Monitored during session;Patient requesting pain meds-RN notified;Repositioned    Home Living Family/patient expects to be discharged to:: Private residence Living Arrangements: Parent(Mom) Available Help at Discharge: Family Type of Home: House Home Access: Stairs to enter Entrance Stairs-Rails: Right;Left Home Layout: One level Home Equipment: Environmental consultant - 2 wheels      Prior Function Level of Independence: Independent      Comments: ADLs, IADLs, no driving; family and medicaid transport for community mobility; enjoys shopping, painting/drawing,  reading, and sewing   PT Goals (current goals can now be found in the care plan section) Acute Rehab PT Goals Patient Stated Goal: Go to rehab to learn how to take care of herself PT Goal Formulation: With patient Time For Goal Achievement: 04/22/17 Potential to Achieve Goals: Fair Progress towards PT goals: Progressing toward goals    Frequency    Min 3X/week       PT Plan Current plan remains appropriate    Co-evaluation              AM-PAC PT "6 Clicks" Daily Activity  Outcome Measure  Difficulty turning over in bed (including adjusting bedclothes, sheets and blankets)?: A Little Difficulty moving from lying on back to sitting on the side of the bed? : A Little Difficulty sitting down on and standing up from a chair with arms (e.g., wheelchair, bedside commode, etc,.)?: Unable Help needed moving to and from a bed to chair (including a wheelchair)?: A Lot Help needed walking in hospital room?: Total Help needed climbing 3-5 steps with a railing? : Total 6 Click Score: 11    End of Session Equipment Utilized During Treatment: Gait belt Activity Tolerance: Patient tolerated treatment well Patient left: in chair;with call bell/phone within reach Nurse Communication: Mobility status PT Visit Diagnosis: Pain;Unsteadiness on feet (R26.81) Pain - Right/Left: Left Pain - part of body: Ankle and joints of foot     Time: 1341-1408 PT Time Calculation (min) (ACUTE ONLY): 27 min  Charges:  $Therapeutic Exercise: 8-22 mins $Therapeutic Activity: 8-22 mins                    G Codes:       Earney Navy, PTA Pager: [phone]     Darliss Cheney 04/10/2017, 3:21 PM

## 2017-04-10 NOTE — Telephone Encounter (Signed)
Pt is inpt at moment, will address after disch

## 2017-04-10 NOTE — Consult Note (Signed)
Physical Medicine and Rehabilitation Consult Reason for Consult: Decreased functional mobility Referring Physician: Dr. Sharol Given   HPI: [name] [name] is a 41 y.o. right handed female with history of bipolar disorder, diabetes mellitus peripheral neuropathy, hypertension, OSA on CPAP, CKD stage III, morbid obesity as well as necrotizing fasciitis of the groin and debridement of left second toe ulcer. Recent admission 03/24/2017- 04/02/2017 for severe sepsis secondary to left lower extremity cellulitis. Per chart review and patient, patient lives with her disabled mother who has an amputation, uncle and nephew. 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 Presented 04/07/2017 with decreasing ischemic changes of left lower extremity as well as necrotic abscess. Wound was not felt to be salvageable and underwent transtibial amputation 04/07/2017 with placement of wound VAC per Dr. Sharol Given. Hospital course pain management. Physical therapy evaluation completed with recommendations of physical medicine rehabilitation consult.   Review of Systems  Constitutional: Negative for chills.  HENT: Negative for hearing loss.   Eyes: Negative for blurred vision and double vision.  Respiratory: Positive for shortness of breath. Negative for cough.   Cardiovascular: Positive for leg swelling. Negative for chest pain and palpitations.  Gastrointestinal: Negative for nausea and vomiting.       GERD  Genitourinary: Negative for flank pain and hematuria.  Musculoskeletal: Positive for back pain, joint pain and myalgias.  Skin: Negative for rash.  Neurological: Negative for seizures.  Psychiatric/Behavioral:       Anxiety, bipolar disorder  All other systems reviewed and are negative.  Past Medical History:  Diagnosis Date  . Abnormal Pap smear of cervix 2009  . Abscess    history of multiple abscesses  . Anemia of chronic disease 2002  . Anxiety   . Bipolar disorder ([place])   . Cellulitis 05/21/2014   right eye  . Chronic bronchitis ([place])    "get it q yr" (05/13/2013)  . Chronic pain   . Depression   . Edema of lower extremity   . Endocarditis 2002   subacute bacterial endocarditis.   . Family history of anesthesia complication    "my mom has a hard time coming out from under"  . Fibromyalgia   . GERD (gastroesophageal reflux disease)   . Heart murmur   . History of blood transfusion    "just low blood count" (05/13/2013)  . Hyperlipidemia   . Hypertension   . Hypothyroidism   . Hypothyroidism, adult 03/21/2014  . Necrosis ([place])    and ulceration  . Obesity   . OSA on CPAP    does not wear CPAP  . Peripheral neuropathy   . Type II diabetes mellitus ([place])    Past Surgical History:  Procedure Laterality Date  . DEBRIDEMENT OF SKIN & SOFT TISSUE; DRESSING CHANGE UNDER ANESTHESIA Left 07/14/2012   Performed by Gayland Curry, MD,FACS at [facility] [place], THIGH WOUND Left 07/09/2012   Performed by Harl Bowie, MD at [place]  . I&D Left Thigh Left 07/16/2012   Performed by Gwenyth Ober, MD at [facility] OR  . INCISION AND DRAINAGE ABSCESS     multiple I&Ds  . IRRIGATION AND DEBRIDEMENT ABSCESS Left 08/10/2012   Performed by Madilyn Hook, DO at [place]  . IRRIGATION AND DEBRIDEMENT ABSCESS BUTTOCKS AND THIGH Left 07/06/2012   Performed by Shann Medal, MD at [place]  . IRRIGATION AND DEBRIDEMENT PERIRECTAL ABSCESS N/A 01/05/2015   Performed by Donnie Mesa, MD at [facility] OR  .  IRRIGATION AND DEBRIDEMENT WOUND Left 07/07/2012   Performed by Harl Bowie, MD at [place]  . LEFT BELOW KNEE AMPUTATION Left 04/07/2017   Performed by Newt Minion, MD at [place]  . TONSILLECTOMY  1994   Family History  Problem Relation Age of Onset  . Heart failure Mother   . Diabetes Mother   . Kidney disease Mother   . Kidney disease Father   . Diabetes Father   . Diabetes Paternal Grandmother   . Heart failure Paternal Grandmother   . Other Other    Social History:  reports that  she has been smoking cigarettes.  She has a 3.00 pack-year smoking history. she has never used smokeless tobacco. She reports that she drinks alcohol. She reports that she does not use drugs. Allergies:  Allergies  Allergen Reactions  . Vancomycin Other (See Comments)    Acute renal failure suspected secondary to vanco   Medications Prior to Admission  Medication Sig Dispense Refill  . amitriptyline (ELAVIL) 50 MG tablet Take 1 tablet (50 mg total) by mouth at bedtime. 90 tablet 3  . amoxicillin-clavulanate (AUGMENTIN) 875-125 MG tablet Take 1 tablet every 12 (twelve) hours by mouth. 12 tablet 0  . atorvastatin (LIPITOR) 20 MG tablet TAKE 1 TABLET BY MOUTH EVERY DAY 30 tablet 11  . cyclobenzaprine (FLEXERIL) 10 MG tablet Take 1 tablet (10 mg total) by mouth every 12 (twelve) hours as needed for muscle spasms. 60 tablet 3  . doxycycline (VIBRA-TABS) 100 MG tablet Take 1 tablet (100 mg total) every 12 (twelve) hours by mouth. 12 tablet 0  . furosemide (LASIX) 20 MG tablet Take 2 tablets (40 mg total) daily by mouth. 30 tablet 0  . HYDROcodone-acetaminophen (NORCO/VICODIN) 5-325 MG tablet Take 1-2 tablets every 4 (four) hours as needed by mouth for moderate pain. 10 tablet 0  . insulin glargine (LANTUS) 100 UNIT/ML injection Inject 44 units below the skin at bedtime. Diagnosis code:  E11.65, Z79.4 30 mL 1  . INVOKANA 300 MG TABS tablet Take 1 tablet by mouth daily before breakfast 30 tablet 3  . liraglutide (VICTOZA) 18 MG/3ML SOPN Inject 0.3 mLs (1.8 mg total) daily into the skin. 9 mL 3  . LYRICA 100 MG capsule TAKE 1 CAPSULE BY MOUTH 2 TIMES DAILY 60 capsule 0  . mupirocin ointment (BACTROBAN) 2 % Place 1 application 2 (two) times daily into the nose. 22 g 0  . NOVOLOG FLEXPEN 100 UNIT/ML FlexPen Inject 14 units below the skin 3 times daily with meals 15 mL 1  . prednisoLONE acetate (PRED FORTE) 1 % ophthalmic suspension Place 1 drop into the right eye 4 (four) times daily. 10 mL 0  . VOLTAREN  1 % GEL Apply 4 grams to affected area (topically) 4 times daily 100 g 2  . ACCU-CHEK FASTCLIX LANCETS MISC Check blood sugar 3 times a day before meals 102 each 12  . Blood Glucose Monitoring Suppl (ACCU-CHEK AVIVA PLUS) w/Device KIT 1 each by Does not apply route 4 (four) times daily. 1 kit 1  . glucose blood (ACCU-CHEK AVIVA PLUS) test strip Check blood sugar 4 times a day before meals 150 each 3  . Insulin Pen Needle (B-D UF III MINI PEN NEEDLES) 31G X 5 MM MISC Use 3 per day to inject Insulin 100 each 5  . Insulin Syringe-Needle U-100 31G X 15/64" 0.5 ML MISC Use to inject insulin as isntructed 100 each 6    Home: Home Living Family/patient  expects to be discharged to:: Private residence Living Arrangements: Parent Available Help at Discharge: Family Type of Home: House Home Access: Stairs to enter Technical brewer of Steps: 3 Entrance Stairs-Rails: Right, Left Home Layout: One level Bathroom Shower/Tub: Chiropodist: Standard Home Equipment: None  Functional History: Prior Function Level of Independence: Independent Functional Status:  Mobility: Bed Mobility Overal bed mobility: Needs Assistance Bed Mobility: Supine to Sit Supine to sit: Min guard, HOB elevated(HOB elevated 90 degrees) Sit to supine: Mod assist General bed mobility comments: Assist for LLE initiation of movement. Pt very painful with any movement.  Transfers Overall transfer level: Needs assistance Equipment used: Rolling walker (2 wheeled) Transfers: Sit to/from Stand Sit to Stand: Mod assist, +2 physical assistance General transfer comment: cues for hand placement & Rt foot placement.  (A) to achieve standing and stabilize with standing.  Completed from bed & recliner requiring increased assistance from lower surface (recliner) Ambulation/Gait Ambulation/Gait assistance: Min assist, +2 safety/equipment Ambulation Distance (Feet): (4 hops + 6 hops) Assistive device: Rolling  walker (2 wheeled) Gait Pattern/deviations: Step-to pattern General Gait Details: fatigues quickly.  difficulty clearing floor with Rt foot but able to take small hops forwards.   Gait velocity: Decreased Gait velocity interpretation: Below normal speed for age/gender    ADL:    Cognition: Cognition Overall Cognitive Status: Within Functional Limits for tasks assessed Orientation Level: Oriented X4 Cognition Arousal/Alertness: Awake/alert Behavior During Therapy: WFL for tasks assessed/performed Overall Cognitive Status: Within Functional Limits for tasks assessed  Blood pressure (!) 141/75, pulse (!) 110, temperature 99.3 F (37.4 C), temperature source Oral, resp. rate 16, height '5\' 10"'$  (1.778 m), weight (!) 159.6 kg (351 lb 13.7 oz), last menstrual period 04/06/2017, SpO2 92 %. Physical Exam  Constitutional: She is oriented to person, place, and time. She appears well-developed.  41 year old right-handed obese female  HENT:  Head: Normocephalic and atraumatic.  Eyes: EOM are normal. Right eye exhibits no discharge. Left eye exhibits no discharge.  Neck: Normal range of motion. Neck supple. No thyromegaly present.  Cardiovascular: Regular rhythm and normal heart sounds.  +Tachycardia  Respiratory: Effort normal and breath sounds normal. No respiratory distress.  GI: Soft. Bowel sounds are normal. She exhibits no distension.  Musculoskeletal: She exhibits edema and tenderness.  Left BKA  Neurological: She is alert and oriented to person, place, and time.  Motor: B/l UE/RLE 5/5 proximal to distal LLE: HF 4/5 (pain inhibition) Sensation intact to light touch  Skin:  Left lower extremity amputation site with wound VAC in place Vascular changes distal RLE  Psychiatric: She has a normal mood and affect. Her behavior is normal.    Results for orders placed or performed during the hospital encounter of 04/07/17 (from the past 24 hour(s))  Glucose, capillary     Status: Abnormal    Collection Time: 04/09/17 11:26 AM  Result Value Ref Range   Glucose-Capillary 150 (H) 65 - 99 mg/dL  Glucose, capillary     Status: Abnormal   Collection Time: 04/09/17  5:26 PM  Result Value Ref Range   Glucose-Capillary 159 (H) 65 - 99 mg/dL  Glucose, capillary     Status: Abnormal   Collection Time: 04/09/17  9:44 PM  Result Value Ref Range   Glucose-Capillary 157 (H) 65 - 99 mg/dL   No results found.  Assessment/Plan: Diagnosis: Left BKA Labs independently reviewed.  Records reviewed and summated above. Clean amputation daily with soap and water Monitor incision site for signs of infection  or impending skin breakdown. Staples to remain in place for 3-4 weeks Stump shrinker, for edema control  Scar mobilization massaging to prevent soft tissue adherence Stump protector during therapies Prevent flexion contractures by implementing the following:   Encourage prone lying for 20-30 mins per day BID to avoid hip flexion  Contractures if medically appropriate;  Avoid pillow under knees when patient is lying in bed in order to prevent both  knee and hip flexion contractures;  Avoid prolonged sitting Post surgical pain control with oral medication Phantom limb pain control with physical modalities including desensitization techniques (gentle self massage to the residual stump,hot packs if sensation intact, [place]) and mirror therapy, TENS. If ineffective, consider pharmacological treatment for neuropathic pain (e.g gabapentin, pregabalin, amytriptalyine, duloxetine).  When using wheelchair, patient should have knee on amputated side fully extended with board under the seat cushion. Avoid injury to contralateral side  1. Does the need for close, 24 hr/day medical supervision in concert with the patient's rehab needs make it unreasonable for this patient to be served in a less intensive setting? Yes  2. Co-Morbidities requiring supervision/potential complications: post-op pain management  (Biofeedback training with therapies to help reduce reliance on opiate pain medications, particularly IV dilaudid, monitor pain control during therapies, and sedation at rest and titrate to maximum efficacy to ensure participation and gains in therapies), bipolar disorder (cont meds), diabetes mellitus peripheral neuropathy (Monitor in accordance with exercise and adjust meds as necessary), HTN (monitor and provide prns in accordance with increased physical exertion and pain), OSA (cont CPAP, monitor for daytime somnolence), CKD stage III (avoid nephrotoxic meds), super obesity (Body mass index is 50.49 kg/m., diet and exercise education, encourage weight loss to increase endurance and promote overall health), leukocytosis (cont to monitor for signs and symptoms of infection, further workup if indicated), ABLA (transfuse if necessary to ensure appropriate perfusion for increased activity tolerance), hyperkalemia (cont to monitor, treat if necessary) 3. Due to safety, skin/wound care, disease management, pain management and patient education, does the patient require 24 hr/day rehab nursing? Yes 4. Does the patient require coordinated care of a physician, rehab nurse, PT (1-2 hrs/day, 5 days/week) and OT (1-2 hrs/day, 5 days/week) to address physical and functional deficits in the context of the above medical diagnosis(es)? Yes Addressing deficits in the following areas: balance, endurance, locomotion, strength, transferring, bathing, dressing, toileting and psychosocial support 5. Can the patient actively participate in an intensive therapy program of at least 3 hrs of therapy per day at least 5 days per week? Likely in the near future 6. The potential for patient to make measurable gains while on inpatient rehab is excellent 7. Anticipated functional outcomes upon discharge from inpatient rehab are supervision and min assist  with PT, supervision and min assist with OT, n/a with SLP. 8. Estimated rehab  length of stay to reach the above functional goals is: 14-18 days. 9. Anticipated D/C setting: Other 10. Anticipated post D/C treatments: SNF 11. Overall Rehab/Functional Prognosis: good  RECOMMENDATIONS: This patient's condition is appropriate for continued rehabilitative care in the following setting: Pt is currently the caregiver for her mother, which she will not be able to return to immediately post discharge.  Will need to explore other options as well as wait improved pain control. Patient has agreed to participate in recommended program. Yes Note that insurance prior authorization may be required for reimbursement for recommended care.  Comment: Rehab Admissions Coordinator to follow up.  Delice Lesch, MD, ABPMR Lavon Paganini Angiulli, PA-C  04/10/2017 

## 2017-04-10 NOTE — Evaluation (Signed)
Occupational Therapy Evaluation Patient Details Name: [name] [name] MRN: [id number] DOB: Jan 16, 1976 Today's Date: 04/10/2017    History of Present Illness Pt is a 41 y/o femalewith PMH significant ofnecrotizing fasciitis of the groin,diabetic neuropathy, T2DM, HTN, OSA on CPAP, CKD stage 2-3 and Obesity,chronic back pain and bipolar disorder. Pt with recent admission for severe sepsis secondary to left lower extremity cellulitis d/t L foot second  toe ulcer. She is now s/p L BKA on 04/07/17.   Clinical Impression   PTA, pt was living with her mom and was independent. Currently, pt requires Mod A for LB ADLs and for sit<>stand using RW. Pt highly motivated to participate in therapy and return to PLOF. Pt would benefit form further acute OT to increase safety and independence with ADLs and functional mobility. Recommend dc CIR for intensive OT to optimize return to PLOF and achieve Mod I level before returning home.    Follow Up Recommendations  CIR;Supervision/Assistance - 24 hour    Equipment Recommendations  Other (comment);Tub/shower bench(Defer to next venue)    Recommendations for Other Services PT consult;Rehab consult     Precautions / Restrictions Precautions Precautions: Fall Precaution Comments: left foot wound Restrictions Weight Bearing Restrictions: Yes LLE Weight Bearing: Non weight bearing Other Position/Activity Restrictions: Recent BKA on LLE; wound vac      Mobility Bed Mobility               General bed mobility comments: Pt in recliner upon arrival  Transfers Overall transfer level: Needs assistance Equipment used: Rolling walker (2 wheeled) Transfers: Sit to/from Stand Sit to Stand: Mod assist Stand pivot transfers: Min assist;+2 safety/equipment       General transfer comment: Mod A for power into standing. Min A for safe descent. Pt verbalizing sequencing during transfer and demonstrating good technique    Balance Overall  balance assessment: Needs assistance Sitting-balance support: Feet supported;No upper extremity supported Sitting balance-Leahy Scale: Fair     Standing balance support: Bilateral upper extremity supported;During functional activity Standing balance-Leahy Scale: Poor Standing balance comment: Reliant on UE support                           ADL either performed or assessed with clinical judgement   ADL Overall ADL's : Needs assistance/impaired Eating/Feeding: Set up;Sitting   Grooming: Set up;Sitting   Upper Body Bathing: Set up;Sitting Upper Body Bathing Details (indicate cue type and reason): Pt having sponge bathed right before OT arrival. Lower Body Bathing: Min guard;Sitting/lateral leans Lower Body Bathing Details (indicate cue type and reason): Pt having sponge bathed right before OT arrival. Pt describing laterally leaning for washing buttocks and leaning back for peri care. Upper Body Dressing : Set up;Sitting   Lower Body Dressing: Moderate assistance;Sit to/from stand Lower Body Dressing Details (indicate cue type and reason): Mod A for standing balance. Pt able to don/dof R sock while sitting in recliner             Functional mobility during ADLs: Moderate assistance;Rolling walker(Sit<>stand; two hops backward toward recliner) General ADL Comments: Pt highly motivated to return to independence. Educated pt on sensory stimulation technique post amputation; pt verbalizing understanding. Pt performing sponge bath      Vision Baseline Vision/History: Retinopathy(laser surgery recently for L eye) Patient Visual Report: No change from baseline       Perception     Praxis      Pertinent Vitals/Pain Pain Assessment: Faces Faces Pain  Scale: Hurts even more Pain Location: L LE Pain Descriptors / Indicators: Grimacing;Guarding;Pressure;Moaning;Squeezing Pain Intervention(s): Monitored during session;Limited activity within patient's tolerance;Repositioned      Hand Dominance Right   Extremity/Trunk Assessment Upper Extremity Assessment Upper Extremity Assessment: Overall WFL for tasks assessed   Lower Extremity Assessment Lower Extremity Assessment: Defer to PT evaluation LLE Deficits / Details: Recent BKA LLE Sensation: (Phantom pain/sensation of lower leg)   Cervical / Trunk Assessment Cervical / Trunk Assessment: Normal   Communication Communication Communication: No difficulties   Cognition Arousal/Alertness: Awake/alert Behavior During Therapy: WFL for tasks assessed/performed Overall Cognitive Status: Within Functional Limits for tasks assessed                                     General Comments  Educated pt on sensory integration and phantom pain.    Exercises    Shoulder Instructions      Home Living Family/patient expects to be discharged to:: Private residence Living Arrangements: Parent(Mom) Available Help at Discharge: Family Type of Home: House Home Access: Stairs to enter Technical brewer of Steps: 2 Entrance Stairs-Rails: Right;Left Home Layout: One level     Bathroom Shower/Tub: Corporate investment banker: [place]: Environmental consultant - 2 wheels          Prior Functioning/Environment Level of Independence: Independent        Comments: ADLs, IADLs, no driving; family and medicaid transport for community mobility; enjoys shopping, painting/drawing, reading, and sewing        OT Problem List: Decreased range of motion;Decreased activity tolerance;Impaired balance (sitting and/or standing);Decreased cognition;Decreased safety awareness;Decreased knowledge of use of DME or AE;Decreased knowledge of precautions;Pain      OT Treatment/Interventions: Self-care/ADL training;Therapeutic exercise;Energy conservation;DME and/or AE instruction;Therapeutic activities;Patient/family education    OT Goals(Current goals can be found in the care plan section)  Acute Rehab OT Goals Patient Stated Goal: Go to rehab to learn how to take care of herself OT Goal Formulation: With patient Time For Goal Achievement: 04/24/17 Potential to Achieve Goals: Good ADL Goals Pt Will Perform Upper Body Dressing: with modified independence;sitting Pt Will Perform Lower Body Dressing: with set-up;with supervision;sit to/from stand Pt Will Transfer to Toilet: with min guard assist;ambulating;bedside commode Pt Will Perform Toileting - Clothing Manipulation and hygiene: with min guard assist;sit to/from stand;sitting/lateral leans  OT Frequency: Min 2X/week   Barriers to D/C:            Co-evaluation              AM-PAC PT "6 Clicks" Daily Activity     Outcome Measure Help from another person eating meals?: A Little Help from another person taking care of personal grooming?: A Little Help from another person toileting, which includes using toliet, bedpan, or urinal?: A Lot Help from another person bathing (including washing, rinsing, drying)?: A Lot Help from another person to put on and taking off regular upper body clothing?: A Little Help from another person to put on and taking off regular lower body clothing?: A Lot 6 Click Score: 15   End of Session Equipment Utilized During Treatment: Rolling walker Nurse Communication: Mobility status;Weight bearing status;Precautions  Activity Tolerance: Patient tolerated treatment well Patient left: in chair;with call bell/phone within reach  OT Visit Diagnosis: Unsteadiness on feet (R26.81);Other abnormalities of gait and mobility (R26.89);Muscle weakness (generalized) (M62.81)  Time: 3601-6580 OT Time Calculation (min): 24 min Charges:  OT General Charges $OT Visit: 1 Visit OT Evaluation $OT Eval Moderate Complexity: 1 Mod OT Treatments $Self Care/Home Management : 8-22 mins G-Codes:     Kaci Dillie MSOT, OTR/L Acute Rehab Pager: [phone] Office: [place] 04/10/2017, 3:41 PM

## 2017-04-11 ENCOUNTER — Inpatient Hospital Stay (HOSPITAL_COMMUNITY)
Admission: RE | Admit: 2017-04-11 | Discharge: 2017-04-25 | DRG: 560 | Disposition: A | Discharge status: Home Health | Payer: Medicare Other | Source: Intra-hospital | Attending: Physical Medicine & Rehabilitation | Admitting: Physical Medicine & Rehabilitation

## 2017-04-11 DIAGNOSIS — Z6841 Body Mass Index (BMI) 40.0 and over, adult: Secondary | ICD-10-CM

## 2017-04-11 DIAGNOSIS — K5900 Constipation, unspecified: Secondary | ICD-10-CM | POA: Diagnosis present

## 2017-04-11 DIAGNOSIS — Z833 Family history of diabetes mellitus: Secondary | ICD-10-CM

## 2017-04-11 DIAGNOSIS — R609 Edema, unspecified: Secondary | ICD-10-CM | POA: Diagnosis not present

## 2017-04-11 DIAGNOSIS — M797 Fibromyalgia: Secondary | ICD-10-CM | POA: Diagnosis present

## 2017-04-11 DIAGNOSIS — G4733 Obstructive sleep apnea (adult) (pediatric): Secondary | ICD-10-CM | POA: Diagnosis present

## 2017-04-11 DIAGNOSIS — S88112S Complete traumatic amputation at level between knee and ankle, left lower leg, sequela: Secondary | ICD-10-CM | POA: Diagnosis not present

## 2017-04-11 DIAGNOSIS — E871 Hypo-osmolality and hyponatremia: Secondary | ICD-10-CM | POA: Diagnosis present

## 2017-04-11 DIAGNOSIS — N183 Chronic kidney disease, stage 3 unspecified: Secondary | ICD-10-CM

## 2017-04-11 DIAGNOSIS — Z89512 Acquired absence of left leg below knee: Secondary | ICD-10-CM | POA: Diagnosis not present

## 2017-04-11 DIAGNOSIS — E1122 Type 2 diabetes mellitus with diabetic chronic kidney disease: Secondary | ICD-10-CM | POA: Diagnosis present

## 2017-04-11 DIAGNOSIS — W19XXXS Unspecified fall, sequela: Secondary | ICD-10-CM | POA: Diagnosis not present

## 2017-04-11 DIAGNOSIS — E1165 Type 2 diabetes mellitus with hyperglycemia: Secondary | ICD-10-CM

## 2017-04-11 DIAGNOSIS — F17210 Nicotine dependence, cigarettes, uncomplicated: Secondary | ICD-10-CM | POA: Diagnosis present

## 2017-04-11 DIAGNOSIS — E785 Hyperlipidemia, unspecified: Secondary | ICD-10-CM | POA: Diagnosis present

## 2017-04-11 DIAGNOSIS — E1142 Type 2 diabetes mellitus with diabetic polyneuropathy: Secondary | ICD-10-CM | POA: Diagnosis present

## 2017-04-11 DIAGNOSIS — I129 Hypertensive chronic kidney disease with stage 1 through stage 4 chronic kidney disease, or unspecified chronic kidney disease: Secondary | ICD-10-CM | POA: Diagnosis present

## 2017-04-11 DIAGNOSIS — Z881 Allergy status to other antibiotic agents status: Secondary | ICD-10-CM | POA: Diagnosis not present

## 2017-04-11 DIAGNOSIS — Z79899 Other long term (current) drug therapy: Secondary | ICD-10-CM | POA: Diagnosis not present

## 2017-04-11 DIAGNOSIS — Z794 Long term (current) use of insulin: Secondary | ICD-10-CM | POA: Diagnosis not present

## 2017-04-11 DIAGNOSIS — D62 Acute posthemorrhagic anemia: Secondary | ICD-10-CM | POA: Diagnosis present

## 2017-04-11 DIAGNOSIS — Z4781 Encounter for orthopedic aftercare following surgical amputation: Principal | ICD-10-CM

## 2017-04-11 DIAGNOSIS — J40 Bronchitis, not specified as acute or chronic: Secondary | ICD-10-CM | POA: Diagnosis not present

## 2017-04-11 DIAGNOSIS — IMO0002 Reserved for concepts with insufficient information to code with codable children: Secondary | ICD-10-CM

## 2017-04-11 DIAGNOSIS — W19XXXA Unspecified fall, initial encounter: Secondary | ICD-10-CM | POA: Diagnosis not present

## 2017-04-11 DIAGNOSIS — Z22322 Carrier or suspected carrier of Methicillin resistant Staphylococcus aureus: Secondary | ICD-10-CM

## 2017-04-11 DIAGNOSIS — D631 Anemia in chronic kidney disease: Secondary | ICD-10-CM | POA: Diagnosis present

## 2017-04-11 DIAGNOSIS — I10 Essential (primary) hypertension: Secondary | ICD-10-CM | POA: Diagnosis not present

## 2017-04-11 DIAGNOSIS — S88019A Complete traumatic amputation at knee level, unspecified lower leg, initial encounter: Secondary | ICD-10-CM | POA: Diagnosis not present

## 2017-04-11 HISTORY — DX: Acquired absence of left leg below knee: Z89.512

## 2017-04-11 LAB — GLUCOSE, CAPILLARY
GLUCOSE-CAPILLARY: 169 mg/dL — AB (ref 65–99)
Glucose-Capillary: 235 mg/dL — ABNORMAL HIGH (ref 65–99)
Glucose-Capillary: 168 mg/dL — ABNORMAL HIGH (ref 65–99)
Glucose-Capillary: 175 mg/dL — ABNORMAL HIGH (ref 65–99)

## 2017-04-11 MED ORDER — ACETAMINOPHEN 650 MG RE SUPP: 650.0000 mg | RECTAL | Status: DC | PRN

## 2017-04-11 MED ORDER — PREGABALIN 50 MG PO CAPS
100.0000 mg | ORAL_CAPSULE | Freq: Two times a day (BID) | ORAL | Status: DC
  Administered 2017-04-11 – 2017-04-16 (×10): 100 mg via ORAL
  Filled 2017-04-11 (×10): qty 2

## 2017-04-11 MED ORDER — BISACODYL 10 MG RE SUPP: 10.0000 mg | Freq: Every day | RECTAL | Status: DC | PRN

## 2017-04-11 MED ORDER — POLYETHYLENE GLYCOL 3350 17 G PO PACK: 17.0000 g | PACK | Freq: Every day | ORAL | Status: DC | PRN

## 2017-04-11 MED ORDER — ONDANSETRON HCL 4 MG PO TABS
4.0000 mg | ORAL_TABLET | Freq: Four times a day (QID) | ORAL | Status: DC | PRN
  Administered 2017-04-21: 4 mg via ORAL
  Filled 2017-04-11: qty 1

## 2017-04-11 MED ORDER — METHOCARBAMOL 1000 MG/10ML IJ SOLN
500.0000 mg | Freq: Four times a day (QID) | INTRAMUSCULAR | Status: DC | PRN
  Filled 2017-04-11: qty 5

## 2017-04-11 MED ORDER — SORBITOL 70 % SOLN: 30.0000 mL | Freq: Every day | Status: DC | PRN

## 2017-04-11 MED ORDER — METHOCARBAMOL 500 MG PO TABS
500.0000 mg | ORAL_TABLET | Freq: Four times a day (QID) | ORAL | Status: DC | PRN
  Administered 2017-04-12 – 2017-04-25 (×27): 500 mg via ORAL
  Filled 2017-04-11 (×28): qty 1

## 2017-04-11 MED ORDER — OXYCODONE HCL 5 MG PO TABS
10.0000 mg | ORAL_TABLET | ORAL | Status: DC | PRN
  Administered 2017-04-11 – 2017-04-16 (×19): 10 mg via ORAL
  Filled 2017-04-11 (×19): qty 2

## 2017-04-11 MED ORDER — INSULIN GLARGINE 100 UNIT/ML ~~LOC~~ SOLN
30.0000 [IU] | Freq: Every day | SUBCUTANEOUS | Status: DC
  Administered 2017-04-11 – 2017-04-21 (×10): 30 [IU] via SUBCUTANEOUS
  Filled 2017-04-11 (×10): qty 0.3

## 2017-04-11 MED ORDER — FUROSEMIDE 40 MG PO TABS
40.0000 mg | ORAL_TABLET | Freq: Every day | ORAL | Status: DC
  Administered 2017-04-12: 40 mg via ORAL
  Filled 2017-04-11: qty 1

## 2017-04-11 MED ORDER — DOCUSATE SODIUM 100 MG PO CAPS
100.0000 mg | ORAL_CAPSULE | Freq: Two times a day (BID) | ORAL | Status: DC
  Administered 2017-04-11 – 2017-04-25 (×28): 100 mg via ORAL
  Filled 2017-04-11 (×28): qty 1

## 2017-04-11 MED ORDER — ACETAMINOPHEN 325 MG PO TABS: 650.0000 mg | ORAL_TABLET | ORAL | Status: DC | PRN

## 2017-04-11 MED ORDER — AMITRIPTYLINE HCL 50 MG PO TABS
50.0000 mg | ORAL_TABLET | Freq: Every day | ORAL | Status: DC
  Administered 2017-04-11 – 2017-04-24 (×14): 50 mg via ORAL
  Filled 2017-04-11 (×14): qty 1

## 2017-04-11 MED ORDER — ATORVASTATIN CALCIUM 20 MG PO TABS
20.0000 mg | ORAL_TABLET | Freq: Every day | ORAL | Status: DC
  Administered 2017-04-12 – 2017-04-24 (×13): 20 mg via ORAL
  Filled 2017-04-11 (×10): qty 1
  Filled 2017-04-11: qty 2
  Filled 2017-04-11 (×2): qty 1

## 2017-04-11 MED ORDER — ASPIRIN EC 325 MG PO TBEC
325.0000 mg | DELAYED_RELEASE_TABLET | Freq: Every day | ORAL | Status: DC
  Administered 2017-04-12 – 2017-04-24 (×13): 325 mg via ORAL
  Filled 2017-04-11 (×14): qty 1

## 2017-04-11 MED ORDER — INSULIN ASPART 100 UNIT/ML ~~LOC~~ SOLN
6.0000 [IU] | Freq: Three times a day (TID) | SUBCUTANEOUS | Status: DC
  Administered 2017-04-12 – 2017-04-25 (×39): 6 [IU] via SUBCUTANEOUS

## 2017-04-11 MED ORDER — INSULIN ASPART 100 UNIT/ML ~~LOC~~ SOLN
0.0000 [IU] | Freq: Three times a day (TID) | SUBCUTANEOUS | Status: DC
  Administered 2017-04-12: 7 [IU] via SUBCUTANEOUS
  Administered 2017-04-12: 3 [IU] via SUBCUTANEOUS
  Administered 2017-04-12: 4 [IU] via SUBCUTANEOUS
  Administered 2017-04-13: 7 [IU] via SUBCUTANEOUS
  Administered 2017-04-13: 3 [IU] via SUBCUTANEOUS
  Administered 2017-04-14: 4 [IU] via SUBCUTANEOUS
  Administered 2017-04-14: 3 [IU] via SUBCUTANEOUS
  Administered 2017-04-14 – 2017-04-15 (×3): 4 [IU] via SUBCUTANEOUS
  Administered 2017-04-15: 7 [IU] via SUBCUTANEOUS
  Administered 2017-04-16: 3 [IU] via SUBCUTANEOUS
  Administered 2017-04-16: 7 [IU] via SUBCUTANEOUS
  Administered 2017-04-16: 3 [IU] via SUBCUTANEOUS
  Administered 2017-04-17: 4 [IU] via SUBCUTANEOUS
  Administered 2017-04-17: 3 [IU] via SUBCUTANEOUS
  Administered 2017-04-18: 4 [IU] via SUBCUTANEOUS
  Administered 2017-04-18: 3 [IU] via SUBCUTANEOUS
  Administered 2017-04-18: 4 [IU] via SUBCUTANEOUS
  Administered 2017-04-19: 7 [IU] via SUBCUTANEOUS
  Administered 2017-04-19 – 2017-04-20 (×2): 4 [IU] via SUBCUTANEOUS
  Administered 2017-04-20 – 2017-04-21 (×2): 15 [IU] via SUBCUTANEOUS
  Administered 2017-04-21 (×2): 7 [IU] via SUBCUTANEOUS
  Administered 2017-04-22: 11 [IU] via SUBCUTANEOUS
  Administered 2017-04-22: 3 [IU] via SUBCUTANEOUS
  Administered 2017-04-22: 7 [IU] via SUBCUTANEOUS
  Administered 2017-04-23 (×2): 4 [IU] via SUBCUTANEOUS
  Administered 2017-04-23 – 2017-04-24 (×2): 7 [IU] via SUBCUTANEOUS
  Administered 2017-04-24 – 2017-04-25 (×3): 4 [IU] via SUBCUTANEOUS
  Administered 2017-04-25: 7 [IU] via SUBCUTANEOUS

## 2017-04-11 MED ORDER — ONDANSETRON HCL 4 MG/2ML IJ SOLN: 4.0000 mg | Freq: Four times a day (QID) | INTRAMUSCULAR | Status: DC | PRN

## 2017-04-11 NOTE — Discharge Summary (Signed)
Discharge Diagnoses:  Active Problems:   Cutaneous abscess of left foot   Below knee amputation status, left (HCC)   Unilateral complete BKA, left, subsequent encounter ([facility])   Post-operative pain   Bipolar affective disorder (HCC)   Type 2 diabetes mellitus with peripheral neuropathy (HCC)   Benign essential HTN   OSA (obstructive sleep apnea)   Super obese   Leukocytosis   Acute blood loss anemia   Surgeries: Procedure(s): LEFT BELOW KNEE AMPUTATION on 04/07/2017    Consultants:   Discharged Condition: Improved  Hospital Course: [name] [name] is an 41 y.o. female who was admitted 04/07/2017 with a chief complaint of abscess left foot, with a final diagnosis of Abscess Left Foot.  Patient was brought to the operating room on 04/07/2017 and underwent Procedure(s): LEFT BELOW KNEE AMPUTATION.    Patient was given perioperative antibiotics:  Anti-infectives (From admission, onward)   Start     Dose/Rate Route Frequency Ordered Stop   04/08/17 0600  ceFAZolin (ANCEF) IVPB 2g/100 mL premix     2 g 200 mL/hr over 30 Minutes Intravenous On call to O.R. 04/07/17 1427 04/07/17 1630   04/07/17 2030  ceFAZolin (ANCEF) IVPB 2g/100 mL premix     2 g 200 mL/hr over 30 Minutes Intravenous Every 6 hours 04/07/17 1740 04/08/17 0923   04/07/17 1434  ceFAZolin (ANCEF) 2-4 GM/100ML-% IVPB    Comments:  Forte, Lindsi   : cabinet override      04/07/17 1434 04/07/17 1630    .  Patient was given sequential compression devices, early ambulation, and aspirin for DVT prophylaxis.  Recent vital signs:  Patient Vitals for the past 24 hrs:  BP Temp Temp src Pulse Resp SpO2  04/11/17 0500 (!) 142/82 99.3 F (37.4 C) Oral (!) 106 20 99 %  04/10/17 2029 (!) 144/95 98 F (36.7 C) Oral (!) 103 18 100 %  04/10/17 1525 119/79 98.8 F (37.1 C) Oral (!) 101 17 98 %  04/10/17 0659 (!) 143/77 99 F (37.2 C) Oral (!) 106 16 98 %  .  Recent laboratory studies: No results found.  Discharge  Medications:   Allergies as of 04/11/2017      Reactions   Vancomycin Other (See Comments)   Acute renal failure suspected secondary to vanco      Medication List    STOP taking these medications   amoxicillin-clavulanate 875-125 MG tablet Commonly known as:  AUGMENTIN   doxycycline 100 MG tablet Commonly known as:  VIBRA-TABS     TAKE these medications   ACCU-CHEK AVIVA PLUS w/Device Kit 1 each by Does not apply route 4 (four) times daily.   ACCU-CHEK FASTCLIX LANCETS Misc Check blood sugar 3 times a day before meals   amitriptyline 50 MG tablet Commonly known as:  ELAVIL Take 1 tablet (50 mg total) by mouth at bedtime.   atorvastatin 20 MG tablet Commonly known as:  LIPITOR TAKE 1 TABLET BY MOUTH EVERY DAY   cyclobenzaprine 10 MG tablet Commonly known as:  FLEXERIL Take 1 tablet (10 mg total) by mouth every 12 (twelve) hours as needed for muscle spasms.   furosemide 20 MG tablet Commonly known as:  LASIX Take 2 tablets (40 mg total) daily by mouth.   glucose blood test strip Commonly known as:  ACCU-CHEK AVIVA PLUS Check blood sugar 4 times a day before meals   HYDROcodone-acetaminophen 5-325 MG tablet Commonly known as:  NORCO/VICODIN Take 1-2 tablets every 4 (four) hours as needed by  mouth for moderate pain.   insulin glargine 100 UNIT/ML injection Commonly known as:  LANTUS Inject 44 units below the skin at bedtime. Diagnosis code:  E11.65, Z79.4   Insulin Pen Needle 31G X 5 MM Misc Commonly known as:  B-D UF III MINI PEN NEEDLES Use 3 per day to inject Insulin   Insulin Syringe-Needle U-100 31G X 15/64" 0.5 ML Misc Use to inject insulin as isntructed   INVOKANA 300 MG Tabs tablet Generic drug:  canagliflozin Take 1 tablet by mouth daily before breakfast   liraglutide 18 MG/3ML Sopn Commonly known as:  VICTOZA Inject 0.3 mLs (1.8 mg total) daily into the skin.   LYRICA 100 MG capsule Generic drug:  pregabalin TAKE 1 CAPSULE BY MOUTH 2 TIMES  DAILY   mupirocin ointment 2 % Commonly known as:  BACTROBAN Place 1 application 2 (two) times daily into the nose.   NOVOLOG FLEXPEN 100 UNIT/ML FlexPen Generic drug:  insulin aspart Inject 14 units below the skin 3 times daily with meals   oxyCODONE-acetaminophen 5-325 MG tablet Commonly known as:  PERCOCET/ROXICET Take 1 tablet every 4 (four) hours as needed by mouth for severe pain.   prednisoLONE acetate 1 % ophthalmic suspension Commonly known as:  PRED FORTE Place 1 drop into the right eye 4 (four) times daily.   VOLTAREN 1 % Gel Generic drug:  diclofenac sodium Apply 4 grams to affected area (topically) 4 times daily       Diagnostic Studies: Ct Foot Left Wo Contrast  Result Date: 03/24/2017 CLINICAL DATA:  Bilateral lower extremity swelling x2 days. EXAM: CT OF THE LEFT FOOT WITHOUT CONTRAST TECHNIQUE: Multidetector CT imaging of the left foot was performed according to the standard protocol. Multiplanar CT image reconstructions were also generated. COMPARISON:  II 18 foot radiographs. FINDINGS: Bones/Joint/Cartilage Degenerative subchondral cysts of the medial, middle and lateral cuneiform secondary to osteoarthritis across navicular-cuneiform as well as the second and third TMT joints. No fracture or suspicious osseous lesions. Dorsal calcaneal enthesopathy. No joint dislocations. Ligaments Suboptimally assessed by CT. Muscles and Tendons No significant muscle atrophy. No intramuscular hemorrhage. The tendons crossing the ankle joint demonstrate no evidence of tenosynovitis or tendinopathy. Soft tissues Diffuse skin thickening and subcutaneous soft tissue induration consistent cellulitis and/or third spacing. Numerous calcified phleboliths are seen within the subcutaneous soft tissues about the ankle and foot. IMPRESSION: 1. Diffuse skin thickening and subcutaneous soft tissue edema consistent with cellulitis and/or third spacing of fluid possibly from chronic venous  insufficiency. 2. Midfoot osteoarthritis with subcortical and subchondral cystic change of the cuneiforms. No acute fracture or bone destruction. 3. Prominent dorsal calcaneal enthesophyte. Electronically Signed   By: Ashley Royalty M.D.   On: 03/24/2017 22:37   Mr Tibia Fibula Left W Wo Contrast  Result Date: 03/27/2017 CLINICAL DATA:  Erythema and swelling of the left lower leg x3 days. EXAM: MRI OF LOWER LEFT EXTREMITY WITHOUT AND WITH CONTRAST TECHNIQUE: Multiplanar, multisequence MR imaging of the left tibia and fibula including the left foot was performed both before and after administration of intravenous contrast. CONTRAST:  20 cc MULTIHANCE GADOBENATE DIMEGLUMINE 529 MG/ML IV SOLN COMPARISON:  03/24/2017 radiographs and CT of the left foot FINDINGS: Bones/Joint/Cartilage No acute fracture, bone destruction or marrow signal abnormalities. Degenerative subchondral cystic change of the midfoot consistent with osteoarthritis. No evidence of acute osteomyelitis. Ligaments Noncontributory Muscles and Tendons No evidence of pyomyositis or muscle atrophy. Intact tendons crossing the ankle joint. Soft tissues Diffuse soft tissue edema of the  included left leg and foot with dermal blistering along the dorsum of the forefoot measuring 6 cm in length by 0.8 cm in thickness. IMPRESSION: 1. Diffuse subcutaneous edema of the included left foot, ankle and leg consistent with cellulitis, venous insufficiency and/or third spacing of fluid. 2. No acute marrow signal abnormality indicative acute osteomyelitis, fracture or suspicious osseous lesion. Electronically Signed   By: Ashley Royalty M.D.   On: 03/27/2017 19:55   Dg Chest Portable 1 View  Result Date: 03/24/2017 CLINICAL DATA:  41 year old female with sepsis. EXAM: PORTABLE CHEST 1 VIEW COMPARISON:  Chest radiograph dated 07/27/2012 FINDINGS: There is borderline cardiomegaly. Mild prominence of the central vasculature likely representing mild vascular congestion.  There is no focal consolidation, pleural effusion, or pneumothorax. No acute osseous pathology. IMPRESSION: Borderline cardiomegaly with probable mild vascular congestion. No focal consolidation. Electronically Signed   By: Anner Crete M.D.   On: 03/24/2017 19:07   Dg Foot Complete Left  Result Date: 03/24/2017 CLINICAL DATA:  Swelling in both lower extremities with chest pain and shortness of breath. EXAM: LEFT FOOT - COMPLETE 3+ VIEW COMPARISON:  None. FINDINGS: Cutaneous calcifications along the lower calf probably from venous insufficiency. Soft tissue swelling in the lower calf and foot. Dorsal spurring in the midfoot. Normal alignment at the Lisfranc joint. No acute bony findings. IMPRESSION: 1. Diffuse soft tissue swelling in the foot and lower calf. Calcifications in the cutaneous tissues the lower calf are probably from venous insufficiency. 2. Dorsal midfoot spurring. Electronically Signed   By: Van Clines M.D.   On: 03/24/2017 19:32    Patient benefited maximally from their hospital stay and there were no complications.     Disposition: 06-Home-Health Care Svc Discharge Instructions    Call MD / Call 911   Complete by:  As directed    If you experience chest pain or shortness of breath, CALL 911 and be transported to the hospital emergency room.  If you develope a fever above 101 F, pus (white drainage) or increased drainage or redness at the wound, or calf pain, call your surgeon's office.   Constipation Prevention   Complete by:  As directed    Drink plenty of fluids.  Prune juice may be helpful.  You may use a stool softener, such as Colace (over the counter) 100 mg twice a day.  Use MiraLax (over the counter) for constipation as needed.   Diet - low sodium heart healthy   Complete by:  As directed    Increase activity slowly as tolerated   Complete by:  As directed    Negative Pressure Wound Therapy - Incisional   Complete by:  As directed    Continue VAC for 1  week     Follow-up Information    Newt Minion, MD Follow up in 1 week(s).   Specialty:  Orthopedic Surgery Contact information: [place] [address] [phone]            Signed: Newt Minion 04/11/2017, 6:28 AM

## 2017-04-11 NOTE — Progress Notes (Addendum)
Report called to [facility] receiving nurse. Pt made aware of the transfer into room 4007.  Chibuike Fleek Ladora Daniel, BSN, RN.

## 2017-04-11 NOTE — Progress Notes (Signed)
Patient ID: [name] [name], female   DOB: [birth year], 41 y.o.   MRN: [id number] Patient with compression stocking in place with the incisional wound VAC.  No new drainage.  We will discontinue her IV and IV pain medication.  Anticipate discharge to CIR today.

## 2017-04-11 NOTE — Progress Notes (Signed)
Inpatient Rehabilitation  I have received acute medical clearance and have an IP Rehab bed available to offer patient today.  Will proceed with admission; discussed with team.  Call with questions.   Carmelia Roller., CCC/SLP Admission Coordinator  [facility]  Cell [phone]

## 2017-04-11 NOTE — PMR Pre-admission (Signed)
PMR Admission Coordinator Pre-Admission Assessment  Patient: [name] [name] is an 41 y.o., female MRN: [id number] DOB: 17-Aug-1975 Height: 5\' 10"  (177.8 cm) Weight: (!) 159.6 kg (351 lb 13.7 oz)              Insurance Information HMO:     PPO:      PCP:      IPA:      80/20:      OTHER:  PRIMARY: Medicare A & B      Policy#: [id number]      Subscriber: Self CM Name:       Phone#:      Fax#:  Pre-Cert#: Eligible       Employer:  Benefits:  Phone #: Verified online     Name: Passport One Eff. Date: 09/20/13     Deduct: $1340      Out of Pocket Max: N/A      Life Max: N/A CIR: 100%      SNF: 100% days 1-20, 80% days 21-100 Outpatient: 80%      Co-Pay: 20% Home Health: 100%      Co-Pay: none DME: 80%     Co-Pay: 20% Providers: Patient's choice   SECONDARY: Medicaid of       Policy#: [id number]      Subscriber: Self CM Name:       Phone#:      Fax#:  Pre-Cert#: Coverage code:[id number]      Employer:  Benefits:  Phone #: [phone]     Name: Automated system  Eff. Date: Eligible 04/10/17     Deduct:       Out of Pocket Max:       Life Max:  CIR:       SNF:  Outpatient:      Co-Pay:  Home Health:       Co-Pay:  DME:      Co-Pay:   Medicaid Application Date:       Case Manager:  Disability Application Date:       Case Worker:   Emergency Contact Information Contact Information    Name Relation Home Work Mayo Mother [phone]  [phone]     Current Medical History  Patient Admitting Diagnosis: Left BKA  History of Present Illness: [name] C Clyburnis a 41 y.o.right handed femalewith history of bipolar disorder, diabetes mellitus peripheral neuropathy, hypertension, OSA on CPAP, CKDstage III, morbid obesity as well as necrotizing fasciitis of the groin and debridement of left second toe ulcer. Recent admission 03/24/2017-04/02/2017 for severe sepsis secondary to left lower extremity cellulitis.Per chart review and patient,patient lives with her  disabled mother whohasan amputation, uncle and nephew.She is the caregiver for her mother.Presented 04/07/2017 with decreasing ischemic changes of left lower extremity as well as necrotic abscess. Wound was not felt to be salvageable and underwent transtibial amputation 04/07/2017 with placement of wound VAC per Dr. Sharol Given. Hospital course pain management. MRSA PCR screening positive maintained on contact precautions. Physical therapy evaluation completed with recommendations of physical medicine rehabilitation consult. Patient was admitted for a comprehensive rehabilitation program 04/11/17.         Past Medical History  Past Medical History:  Diagnosis Date  . Abnormal Pap smear of cervix 2009  . Abscess    history of multiple abscesses  . Anemia of chronic disease 2002  . Anxiety   . Bipolar disorder ([place])   . Cellulitis 05/21/2014   right eye  . Chronic bronchitis ([place])    "  get it q yr" (05/13/2013)  . Chronic pain   . Depression   . Edema of lower extremity   . Endocarditis 2002   subacute bacterial endocarditis.   . Family history of anesthesia complication    "my mom has a hard time coming out from under"  . Fibromyalgia   . GERD (gastroesophageal reflux disease)   . Heart murmur   . History of blood transfusion    "just low blood count" (05/13/2013)  . Hyperlipidemia   . Hypertension   . Hypothyroidism   . Hypothyroidism, adult 03/21/2014  . Necrosis ([place])    and ulceration  . Obesity   . OSA on CPAP    does not wear CPAP  . Peripheral neuropathy   . Type II diabetes mellitus (HCC)     Family History  family history includes Diabetes in her father, mother, and paternal grandmother; Heart failure in her mother and paternal grandmother; Kidney disease in her father and mother; Other in her other.  Prior Rehab/Hospitalizations:  Has the patient had major surgery during 100 days prior to admission? No  Current Medications   Current Facility-Administered  Medications:  .  acetaminophen (TYLENOL) tablet 650 mg, 650 mg, Oral, Q4H PRN **OR** acetaminophen (TYLENOL) suppository 650 mg, 650 mg, Rectal, Q4H PRN, Newt Minion, MD .  amitriptyline (ELAVIL) tablet 50 mg, 50 mg, Oral, QHS, Newt Minion, MD, 50 mg at 04/10/17 2312 .  aspirin EC tablet 325 mg, 325 mg, Oral, Daily, Newt Minion, MD, 325 mg at 04/10/17 0944 .  atorvastatin (LIPITOR) tablet 20 mg, 20 mg, Oral, q1800, Newt Minion, MD, 20 mg at 04/10/17 1808 .  bisacodyl (DULCOLAX) suppository 10 mg, 10 mg, Rectal, Daily PRN, Newt Minion, MD .  docusate sodium (COLACE) capsule 100 mg, 100 mg, Oral, BID, Newt Minion, MD, 100 mg at 04/10/17 2312 .  furosemide (LASIX) tablet 40 mg, 40 mg, Oral, Daily, Newt Minion, MD, 40 mg at 04/10/17 0944 .  insulin aspart (novoLOG) injection 0-20 Units, 0-20 Units, Subcutaneous, TID WC, Newt Minion, MD, 7 Units at 04/11/17 0827 .  insulin aspart (novoLOG) injection 6 Units, 6 Units, Subcutaneous, TID WC, Newt Minion, MD, 6 Units at 04/11/17 [phone] .  insulin glargine (LANTUS) injection 30 Units, 30 Units, Subcutaneous, QHS, Newt Minion, MD, 30 Units at 04/11/17 0011 .  magnesium citrate solution 1 Bottle, 1 Bottle, Oral, Once PRN, Newt Minion, MD .  methocarbamol (ROBAXIN) tablet 500 mg, 500 mg, Oral, Q6H PRN, 500 mg at 04/11/17 0833 **OR** methocarbamol (ROBAXIN) 500 mg in dextrose 5 % 50 mL IVPB, 500 mg, Intravenous, Q6H PRN, Newt Minion, MD .  metoCLOPramide (REGLAN) tablet 5-10 mg, 5-10 mg, Oral, Q8H PRN **OR** metoCLOPramide (REGLAN) injection 5-10 mg, 5-10 mg, Intravenous, Q8H PRN, Newt Minion, MD .  mupirocin ointment (BACTROBAN) 2 % 1 application, 1 application, Nasal, BID, Newt Minion, MD, 1 application at 65/46/50 2312 .  ondansetron (ZOFRAN) tablet 4 mg, 4 mg, Oral, Q6H PRN **OR** ondansetron (ZOFRAN) injection 4 mg, 4 mg, Intravenous, Q6H PRN, Newt Minion, MD .  oxyCODONE (Oxy IR/ROXICODONE) immediate release tablet  10 mg, 10 mg, Oral, Q3H PRN, Newt Minion, MD, 10 mg at 04/11/17 3546 .  oxyCODONE (Oxy IR/ROXICODONE) immediate release tablet 5 mg, 5 mg, Oral, Q3H PRN, Newt Minion, MD, 5 mg at 04/08/17 1343 .  polyethylene glycol (MIRALAX / GLYCOLAX) packet 17 g, 17 g, Oral,  Daily PRN, Newt Minion, MD .  pregabalin (LYRICA) capsule 100 mg, 100 mg, Oral, BID, Newt Minion, MD, 100 mg at 04/10/17 2312  Patients Current Diet: Diet Carb Modified Fluid consistency: Thin; Room service appropriate? Yes Diet - low sodium heart healthy  Precautions / Restrictions Precautions Precautions: Fall Precaution Comments: left foot wound Restrictions Weight Bearing Restrictions: Yes LLE Weight Bearing: Non weight bearing Other Position/Activity Restrictions: Recent BKA on LLE; wound vac   Has the patient had 2 or more falls or a fall with injury in the past year?Yes, due to vertigo/dizziness spells  Prior Activity Level Community (5-7x/wk): Prior to admission patient was fully independent and caring for mom at home.  She did the home management, cooking, and cleaning.  She doesn't drive and has family support for transportation.  Same family support is currently caring for her mom and will assist with her transition home as well.    Home Assistive Devices / Equipment Home Assistive Devices/Equipment: CBG Meter Home Equipment: Walker - 2 wheels  Prior Device Use: Indicate devices/aids used by the patient prior to current illness, exacerbation or injury? None of the above  Prior Functional Level Prior Function Level of Independence: Independent Comments: ADLs, IADLs, no driving; family and medicaid transport for community mobility; enjoys shopping, painting/drawing, reading, and sewing  Self Care: Did the patient need help bathing, dressing, using the toilet or eating? Independent  Indoor Mobility: Did the patient need assistance with walking from room to room (with or without device)?  Independent  Stairs: Did the patient need assistance with internal or external stairs (with or without device)? Independent  Functional Cognition: Did the patient need help planning regular tasks such as shopping or remembering to take medications? Independent  Current Functional Level Cognition  Overall Cognitive Status: Within Functional Limits for tasks assessed Orientation Level: Oriented X4    Extremity Assessment (includes Sensation/Coordination)  Upper Extremity Assessment: Overall WFL for tasks assessed  Lower Extremity Assessment: Defer to PT evaluation LLE Deficits / Details: Recent BKA LLE Sensation: (Phantom pain/sensation of lower leg)    ADLs  Overall ADL's : Needs assistance/impaired Eating/Feeding: Set up, Sitting Grooming: Set up, Sitting Upper Body Bathing: Set up, Sitting Upper Body Bathing Details (indicate cue type and reason): Pt having sponge bathed right before OT arrival. Lower Body Bathing: Min guard, Sitting/lateral leans Lower Body Bathing Details (indicate cue type and reason): Pt having sponge bathed right before OT arrival. Pt describing laterally leaning for washing buttocks and leaning back for peri care. Upper Body Dressing : Set up, Sitting Lower Body Dressing: Moderate assistance, Sit to/from stand Lower Body Dressing Details (indicate cue type and reason): Mod A for standing balance. Pt able to don/dof R sock while sitting in recliner Functional mobility during ADLs: Moderate assistance, Rolling walker(Sit<>stand; two hops backward toward recliner) General ADL Comments: Pt highly motivated to return to independence. Educated pt on sensory stimulation technique post amputation; pt verbalizing understanding. Pt performing sponge bath     Mobility  Overal bed mobility: Needs Assistance Bed Mobility: Supine to Sit Supine to sit: Min guard, HOB elevated(HOB elevated 90 degrees) Sit to supine: Mod assist General bed mobility comments: Pt in recliner  upon arrival    Transfers  Overall transfer level: Needs assistance Equipment used: Rolling walker (2 wheeled) Transfers: Sit to/from Stand Sit to Stand: Mod assist Stand pivot transfers: Min assist, +2 safety/equipment General transfer comment: Mod A for power into standing. Min A for safe descent. Pt verbalizing sequencing  during transfer and demonstrating good technique    Ambulation / Gait / Stairs / Wheelchair Mobility  Ambulation/Gait Ambulation/Gait assistance: Min assist, +2 safety/equipment Ambulation Distance (Feet): (4 hops + 6 hops) Assistive device: Rolling walker (2 wheeled) Gait Pattern/deviations: Step-to pattern General Gait Details: fatigues quickly.  difficulty clearing floor with Rt foot but able to take small hops forwards.   Gait velocity: Decreased Gait velocity interpretation: Below normal speed for age/gender    Posture / Balance Balance Overall balance assessment: Needs assistance Sitting-balance support: Feet supported, No upper extremity supported Sitting balance-Leahy Scale: Fair Standing balance support: Bilateral upper extremity supported, During functional activity Standing balance-Leahy Scale: Poor Standing balance comment: Reliant on UE support    Special needs/care consideration BiPAP/CPAP: Yes, CPAP but reports difficulty tolerating at night and doesn't wear at home CPM: No Continuous Drip IV: No Dialysis: No Life Vest: No Oxygen: No Special Bed: No Trach Size: No Wound Vac (area): Yes      Location: Lt BKA Skin: Dry                              Location: Surgical incision to Lt BKA Bowel mgmt: Continent, last BM 04/10/17 Bladder mgmt: Incontinent at times with external catheter used at times  Diabetic mgmt: Yes, PTA not checking CBGs, but taking oral meds and insulin      Previous Home Environment Living Arrangements: Parent(Mom) Available Help at Discharge: Family Type of Home: House Home Layout: One level Home Access: Stairs to  enter Entrance Stairs-Rails: Right, Left Entrance Stairs-Number of Steps: 2 Bathroom Shower/Tub: Tub/shower unit, Architectural technologist: Standard Home Care Services: No  Discharge Living Setting Plans for Discharge Living Setting: Lives with (comment)(mom and is her caregiver ) Type of Home at Discharge: House Discharge Home Layout: One level Discharge Home Access: Stairs to enter Entrance Stairs-Rails: Can reach both Entrance Stairs-Number of Steps: 2 Discharge Bathroom Shower/Tub: Tub/shower unit, Curtain Discharge Bathroom Toilet: Handicapped height Discharge Bathroom Accessibility: Yes How Accessible: Accessible via walker Does the patient have any problems obtaining your medications?: No  Social/Family/Support Systems Patient Roles: Caregiver, Other (Comment)(Daughter, granddaughter, etc. ) Contact Information: Mom: Santiago Glad [place]  Anticipated Caregiver: Family members Anticipated Ambulance person Information: Uncle and nephew with patient to coordinate as needed Ability/Limitations of Caregiver: Wants limited assist in the bathroom due to it being uncle and nephew  Caregiver Availability: 24/7 Discharge Plan Discussed with Primary Caregiver: (Discussed with patient ) Is Caregiver In Agreement with Plan?: (Yes, per patient report. Heard her on phone with family ) Does Caregiver/Family have Issues with Lodging/Transportation while Pt is in Rehab?: No  Goals/Additional Needs Patient/Family Goal for Rehab: PT/OT: Supervision-Min A Expected length of stay: 14-18 days  Cultural Considerations: None Dietary Needs: Carb. Mod. diet restrictions  Equipment Needs: TBD Additional Information: Pt reports a h/o vertigo and dizziness spells intermittently PTA Pt/Family Agrees to Admission and willing to participate: Yes Program Orientation Provided & Reviewed with Pt/Caregiver Including Roles  & Responsibilities: Yes Additional Information Needs: Mom is blind per patient report   Information Needs to be Provided By: Team FYI  Barriers to Discharge: Lack of/limited family support(Limited support for bathroom/self-care )  Decrease burden of Care through IP rehab admission: No  Possible need for SNF placement upon discharge: No  Patient Condition: This patient's condition remains as documented in the consult dated 04/10/17, in which the Rehabilitation Physician determined and documented that the patient's condition is appropriate for  intensive rehabilitative care in an inpatient rehabilitation facility. Will admit to inpatient rehab today.  Preadmission Screen Completed By:  Gunnar Fusi, 04/11/2017 8:39 AM ______________________________________________________________________   Discussed status with Dr. Naaman Plummer on 04/01/17 at 52 and received telephone approval for admission today.  Admission Coordinator:  Gunnar Fusi, time 1000/Date 04/11/17

## 2017-04-11 NOTE — H&P (Signed)
Physical Medicine and Rehabilitation Admission H&P  Chief complaint: Stump pain  HPI: [name] [name] is a 41 y.o. right handed female with history of bipolar disorder, diabetes mellitus peripheral neuropathy, hypertension, OSA on CPAP, CKD stage III, morbid obesity as well as necrotizing fasciitis of the groin and debridement of left second toe ulcer. Recent admission 03/24/2017- 04/02/2017 for severe sepsis secondary to left lower extremity cellulitis. Per chart review and patient, patient lives with her disabled mother who has an amputation, uncle and nephew. 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 Presented 04/07/2017 with decreasing ischemic changes of left lower extremity as well as necrotic abscess. Wound was not felt to be salvageable and underwent transtibial amputation 04/07/2017 with placement of wound VAC per Dr. Sharol Given. Hospital course pain management. MRSA PCR screening positive maintained on contact precautions. Physical therapy evaluation completed with recommendations of physical medicine rehabilitation consult. Patient was admitted for a comprehensive rehabilitation program  Review of Systems  Constitutional: Negative for chills and fever.  HENT: Negative for hearing loss.  Eyes: Negative for blurred vision.  Respiratory: Positive for shortness of breath.  Cardiovascular: Positive for leg swelling. Negative for chest pain and palpitations.  Gastrointestinal: Positive for constipation. Negative for nausea and vomiting.  GERD  Genitourinary: Negative for dysuria, flank pain and hematuria.  Musculoskeletal: Positive for back pain, joint pain and myalgias.  Skin: Negative for rash.  Neurological: Positive for weakness.  Psychiatric/Behavioral: Positive for depression.  Anxiety, bipolar disorder  All other systems reviewed and are negative.       Past Medical History:  Diagnosis Date  . Abnormal Pap smear of cervix 2009  . Abscess    history of multiple abscesses  . Anemia of  chronic disease 2002  . Anxiety   . Bipolar disorder ([place])   . Cellulitis 05/21/2014   right eye  . Chronic bronchitis ([place])    "get it q yr" (05/13/2013)  . Chronic pain   . Depression   . Edema of lower extremity   . Endocarditis 2002   subacute bacterial endocarditis.   . Family history of anesthesia complication    "my mom has a hard time coming out from under"  . Fibromyalgia   . GERD (gastroesophageal reflux disease)   . Heart murmur   . History of blood transfusion    "just low blood count" (05/13/2013)  . Hyperlipidemia   . Hypertension   . Hypothyroidism   . Hypothyroidism, adult 03/21/2014  . Necrosis ([place])    and ulceration  . Obesity   . OSA on CPAP    does not wear CPAP  . Peripheral neuropathy   . Type II diabetes mellitus ([place])         Past Surgical History:  Procedure Laterality Date  . DEBRIDEMENT OF SKIN & SOFT TISSUE; DRESSING CHANGE UNDER ANESTHESIA Left 07/14/2012   Performed by Gayland Curry, MD,[facility] at [place], THIGH WOUND Left 07/09/2012   Performed by Harl Bowie, MD at [facility]  . I&D Left Thigh Left 07/16/2012   Performed by Gwenyth Ober, MD at [facility] OR  . INCISION AND DRAINAGE ABSCESS     multiple I&Ds  . IRRIGATION AND DEBRIDEMENT ABSCESS Left 08/10/2012   Performed by Madilyn Hook, DO at [facility]  . IRRIGATION AND DEBRIDEMENT ABSCESS BUTTOCKS AND THIGH Left 07/06/2012   Performed by Shann Medal, MD at [place]  . IRRIGATION AND DEBRIDEMENT PERIRECTAL ABSCESS N/A 01/05/2015   Performed  by Donnie Mesa, MD at [facility]  . IRRIGATION AND DEBRIDEMENT WOUND Left 07/07/2012   Performed by Harl Bowie, MD at [facility]  . LEFT BELOW KNEE AMPUTATION Left 04/07/2017   Performed by Newt Minion, MD at [facility]  . TONSILLECTOMY  1994        Family History  Problem Relation Age of Onset  . Heart failure Mother   . Diabetes Mother   . Kidney disease Mother   . Kidney disease Father   . Diabetes Father   . Diabetes Paternal  Grandmother   . Heart failure Paternal Grandmother   . Other Other    Social History: reports that she has been smoking cigarettes. She has a 3.00 pack-year smoking history. she has never used smokeless tobacco. She reports that she drinks alcohol. She reports that she does not use drugs.  Allergies:       Allergies  Allergen Reactions  . Vancomycin Other (See Comments)    Acute renal failure suspected secondary to vanco         Medications Prior to Admission  Medication Sig Dispense Refill  . amitriptyline (ELAVIL) 50 MG tablet Take 1 tablet (50 mg total) by mouth at bedtime. 90 tablet 3  . amoxicillin-clavulanate (AUGMENTIN) 875-125 MG tablet Take 1 tablet every 12 (twelve) hours by mouth. 12 tablet 0  . atorvastatin (LIPITOR) 20 MG tablet TAKE 1 TABLET BY MOUTH EVERY DAY 30 tablet 11  . cyclobenzaprine (FLEXERIL) 10 MG tablet Take 1 tablet (10 mg total) by mouth every 12 (twelve) hours as needed for muscle spasms. 60 tablet 3  . doxycycline (VIBRA-TABS) 100 MG tablet Take 1 tablet (100 mg total) every 12 (twelve) hours by mouth. 12 tablet 0  . furosemide (LASIX) 20 MG tablet Take 2 tablets (40 mg total) daily by mouth. 30 tablet 0  . HYDROcodone-acetaminophen (NORCO/VICODIN) 5-325 MG tablet Take 1-2 tablets every 4 (four) hours as needed by mouth for moderate pain. 10 tablet 0  . insulin glargine (LANTUS) 100 UNIT/ML injection Inject 44 units below the skin at bedtime. Diagnosis code: E11.65, Z79.4 30 mL 1  . INVOKANA 300 MG TABS tablet Take 1 tablet by mouth daily before breakfast 30 tablet 3  . liraglutide (VICTOZA) 18 MG/3ML SOPN Inject 0.3 mLs (1.8 mg total) daily into the skin. 9 mL 3  . LYRICA 100 MG capsule TAKE 1 CAPSULE BY MOUTH 2 TIMES DAILY 60 capsule 0  . mupirocin ointment (BACTROBAN) 2 % Place 1 application 2 (two) times daily into the nose. 22 g 0  . NOVOLOG FLEXPEN 100 UNIT/ML FlexPen Inject 14 units below the skin 3 times daily with meals 15 mL 1  . prednisoLONE  acetate (PRED FORTE) 1 % ophthalmic suspension Place 1 drop into the right eye 4 (four) times daily. 10 mL 0  . VOLTAREN 1 % GEL Apply 4 grams to affected area (topically) 4 times daily 100 g 2  . ACCU-CHEK FASTCLIX LANCETS MISC Check blood sugar 3 times a day before meals 102 each 12  . Blood Glucose Monitoring Suppl (ACCU-CHEK AVIVA PLUS) w/Device KIT 1 each by Does not apply route 4 (four) times daily. 1 kit 1  . glucose blood (ACCU-CHEK AVIVA PLUS) test strip Check blood sugar 4 times a day before meals 150 each 3  . Insulin Pen Needle (B-D UF III MINI PEN NEEDLES) 31G X 5 MM MISC Use 3 per day to inject Insulin 100 each 5  . Insulin Syringe-Needle  U-100 31G X 15/64" 0.5 ML MISC Use to inject insulin as isntructed 100 each 6   Drug Regimen Review  Drug regimen was reviewed and remains appropriate with no significant issues identified  Home:  Home Living  Family/patient expects to be discharged to:: Private residence  Living Arrangements: Parent  Available Help at Discharge: Family  Type of Home: House  Home Access: Stairs to enter  Technical brewer of Steps: 3  Entrance Stairs-Rails: Right, Left  Home Layout: One level  Bathroom Shower/Tub: Administrator, Civil Service: Standard  Home Equipment: None  Functional History:  Prior Function  Level of Independence: Independent  Functional Status:  Mobility:  Bed Mobility  Overal bed mobility: Needs Assistance  Bed Mobility: Supine to Sit  Supine to sit: Min guard, HOB elevated(HOB elevated 90 degrees)  Sit to supine: Mod assist  General bed mobility comments: Assist for LLE initiation of movement. Pt very painful with any movement.  Transfers  Overall transfer level: Needs assistance  Equipment used: Rolling walker (2 wheeled)  Transfers: Sit to/from Stand  Sit to Stand: Mod assist, +2 physical assistance  General transfer comment: cues for hand placement & Rt foot placement. (A) to achieve standing and stabilize with  standing. Completed from bed & recliner requiring increased assistance from lower surface (recliner)  Ambulation/Gait  Ambulation/Gait assistance: Min assist, +2 safety/equipment  Ambulation Distance (Feet): (4 hops + 6 hops)  Assistive device: Rolling walker (2 wheeled)  Gait Pattern/deviations: Step-to pattern  General Gait Details: fatigues quickly. difficulty clearing floor with Rt foot but able to take small hops forwards.  Gait velocity: Decreased  Gait velocity interpretation: Below normal speed for age/gender   ADL:   Cognition:  Cognition  Overall Cognitive Status: Within Functional Limits for tasks assessed  Orientation Level: Oriented X4  Cognition  Arousal/Alertness: Awake/alert  Behavior During Therapy: WFL for tasks assessed/performed  Overall Cognitive Status: Within Functional Limits for tasks assessed  Physical Exam:  Blood pressure (!) 143/77, pulse (!) 106, temperature 99 F (37.2 C), temperature source Oral, resp. rate 16, height 5' 10" (1.778 m), weight (!) 159.6 kg (351 lb 13.7 oz), last menstrual period 04/06/2017, SpO2 98 %.  Physical Exam  Vitals reviewed.  Constitutional:  41 year old right-handed obese female  HENT:  Head: Normocephalic.  Eyes: EOM are normal.  Neck: Normal range of motion. Neck supple. No thyromegaly present.  Cardiovascular: Normal rate, regular rhythm and normal heart sounds. Exam reveals no friction rub.  No murmur heard.  Respiratory: Breath sounds normal. No respiratory distress. She has no wheezes.  GI: Soft. Bowel sounds are normal. She exhibits no distension. There is no tenderness. There is no rebound.  Musculoskeletal: She exhibits edema (stump).  Psychiatric: She has a normal mood and affect. Her behavior is normal.  Skin. Wound VAC in place to left lower extremity. Right foot dry, intact  Neurological: She is alert and oriented to person, place, and time.  Motor: B/l UE/RLE 5/5 proximal to distal LLE: HF 4/5 (pain  inhibition present) Sensation intact to light touch  Lab Results Last 48 Hours  Imaging Results (Last 48 hours)     Medical Problem List and Plan:  1. Decreased functional mobility secondary to left transtibial amputation 04/07/2017  -admit to inpatient rehab  2. DVT Prophylaxis/Anticoagulation: SCD right lower extremity  3. Pain Management: Lyrica 100 mg twice a day, oxycodone and Robaxin as needed  4. Mood: Elavil 50 mg daily at bedtime  5. Neuropsych: This patient is capable of making decisions on her own behalf.  6. Skin/Wound Care: Routine skin checks  7. Fluids/Electrolytes/Nutrition: Routine I&O's with follow-up chemistries  8. MRSA PCR screening positive. Contact precautions  9. Diabetes mellitus and peripheral neuropathy. Hemoglobin A1c 9.0. NovoLog 6 units 3 times a day, Lantus insulin 30 units daily at bedtime. Diabetic teaching  10. CKD stage III. Continue low dose Lasix. Follow-up chemistries  11. Hypertension. Monitor with increased mobility  12. MRSA PCR screening positive. Contact precautions  13. Constipation. Laxative assistance  14. Hyperlipidemia. Lipitor    Post Admission Physician Evaluation:  1. Functional deficits secondary to left BKA. 2. Patient is admitted to receive collaborative, interdisciplinary care between the physiatrist, rehab nursing staff, and therapy team. 3. Patient's level of medical complexity and substantial therapy needs in context of that medical necessity cannot be provided at a lesser intensity of care such as a SNF. 4. Patient has experienced substantial functional loss from his/her baseline which was documented above under the "Functional History" and "Functional Status" headings. Judging by the patient's diagnosis, physical exam, and functional history,  the patient has potential for functional progress which will result in measurable gains while on inpatient rehab. These gains will be of substantial and practical use upon discharge in facilitating mobility and self-care at the household level. 5. Physiatrist will provide 24 hour management of medical needs as well as oversight of the therapy plan/treatment and provide guidance as appropriate regarding the interaction of the two. 6. The Preadmission Screening has been reviewed and patient status is unchanged unless otherwise stated above. 7. 24 hour rehab nursing will assist with bladder management, bowel management, safety, skin/wound care, disease management, medication administration, pain management and patient education and help integrate therapy concepts, techniques,education, etc. 8. PT will assess and treat for/with: Lower extremity strength, range of motion, stamina, balance, functional mobility, safety, adaptive techniques and equipment, pre-prosthetic education, pain control, wound/vac mgt. Goals are: supervision to mod I assist 9. OT will assess and treat for/with: ADL's, functional mobility, safety, upper extremity strength, adaptive techniques and equipment, pain mgt, pre-prosthetic mgt. Goals are: supervision to mod I assist. Therapy may not yet proceed with showering this patient. 10. SLP will assess and treat for/with: n/a. Goals are: n/a. 11. Case Management and Social Worker will assess and treat for psychological issues and discharge planning. 12. Team conference will be held weekly to assess progress toward goals and to determine barriers to discharge. 13. Patient will receive at least 3 hours of therapy per day at least 5 days per week. 14. ELOS: 14-18 days  15. Prognosis: excellent   Meredith Staggers, MD, [facility]  04/11/2017  Lavon Paganini Dunean, PA-C  04/10/2017

## 2017-04-12 ENCOUNTER — Encounter (HOSPITAL_COMMUNITY): Payer: Self-pay | Admitting: *Deleted

## 2017-04-12 ENCOUNTER — Inpatient Hospital Stay (HOSPITAL_COMMUNITY): Payer: Self-pay

## 2017-04-12 ENCOUNTER — Inpatient Hospital Stay (HOSPITAL_COMMUNITY): Payer: Self-pay | Admitting: Physical Therapy

## 2017-04-12 ENCOUNTER — Inpatient Hospital Stay (HOSPITAL_COMMUNITY): Payer: Medicare Other | Admitting: Occupational Therapy

## 2017-04-12 DIAGNOSIS — E1165 Type 2 diabetes mellitus with hyperglycemia: Secondary | ICD-10-CM

## 2017-04-12 DIAGNOSIS — E1142 Type 2 diabetes mellitus with diabetic polyneuropathy: Secondary | ICD-10-CM

## 2017-04-12 DIAGNOSIS — S88112S Complete traumatic amputation at level between knee and ankle, left lower leg, sequela: Secondary | ICD-10-CM

## 2017-04-12 DIAGNOSIS — I10 Essential (primary) hypertension: Secondary | ICD-10-CM

## 2017-04-12 LAB — COMPREHENSIVE METABOLIC PANEL
ALK PHOS: 75 U/L (ref 38–126)
ALT: 17 U/L (ref 14–54)
ANION GAP: 8 (ref 5–15)
AST: 26 U/L (ref 15–41)
Albumin: 2.3 g/dL — ABNORMAL LOW (ref 3.5–5.0)
BUN: 20 mg/dL (ref 6–20)
CALCIUM: 8.8 mg/dL — AB (ref 8.9–10.3)
CO2: 23 mmol/L (ref 22–32)
CREATININE: 0.96 mg/dL (ref 0.44–1.00)
Chloride: 102 mmol/L (ref 101–111)
GFR calc Af Amer: >60 mL/min (ref >60)
GFR calc non Af Amer: >60 mL/min (ref >60)
GLUCOSE: 225 mg/dL — AB (ref 65–99)
Potassium: 5 mmol/L (ref 3.5–5.1)
Sodium: 133 mmol/L — ABNORMAL LOW (ref 135–145)
Total Bilirubin: UNDETERMINED mg/dL (ref 0.3–1.2)
Total Protein: 8.4 g/dL — ABNORMAL HIGH (ref 6.5–8.1)

## 2017-04-12 LAB — CBC
HCT: 21.2 % — ABNORMAL LOW (ref 36.0–46.0)
Hemoglobin: 6.4 g/dL — CL (ref 12.0–15.0)
MCH: 24.9 pg — AB (ref 26.0–34.0)
MCHC: 30.2 g/dL (ref 30.0–36.0)
MCV: 82.5 fL (ref 78.0–100.0)
PLATELETS: 349 10*3/uL (ref 150–400)
RBC: 2.57 MIL/uL — AB (ref 3.87–5.11)
RDW: 15.7 % — ABNORMAL HIGH (ref 11.5–15.5)
WBC: 6 10*3/uL (ref 4.0–10.5)

## 2017-04-12 LAB — CBC WITH DIFFERENTIAL/PLATELET
Basophils Absolute: 0 10*3/uL (ref 0.0–0.1)
Basophils Relative: 1 %
EOS PCT: 5 %
Eosinophils Absolute: 0.3 10*3/uL (ref 0.0–0.7)
HCT: 21.5 % — ABNORMAL LOW (ref 36.0–46.0)
HEMOGLOBIN: 6.7 g/dL — AB (ref 12.0–15.0)
LYMPHS ABS: 2.2 10*3/uL (ref 0.7–4.0)
LYMPHS PCT: 36 %
MCH: 25.2 pg — AB (ref 26.0–34.0)
MCHC: 31.2 g/dL (ref 30.0–36.0)
MCV: 80.8 fL (ref 78.0–100.0)
MONOS PCT: 10 %
Monocytes Absolute: 0.6 10*3/uL (ref 0.1–1.0)
NEUTROS PCT: 49 %
Neutro Abs: 3 10*3/uL (ref 1.7–7.7)
Platelets: 384 10*3/uL (ref 150–400)
RBC: 2.66 MIL/uL — AB (ref 3.87–5.11)
RDW: 15.3 % (ref 11.5–15.5)
WBC: 6.1 10*3/uL (ref 4.0–10.5)

## 2017-04-12 LAB — GLUCOSE, CAPILLARY
GLUCOSE-CAPILLARY: 128 mg/dL — AB (ref 65–99)
Glucose-Capillary: 207 mg/dL — ABNORMAL HIGH (ref 65–99)
Glucose-Capillary: 180 mg/dL — ABNORMAL HIGH (ref 65–99)
Glucose-Capillary: 131 mg/dL — ABNORMAL HIGH (ref 65–99)

## 2017-04-12 LAB — PREPARE RBC (CROSSMATCH)

## 2017-04-12 MED ORDER — DIPHENHYDRAMINE HCL 25 MG PO CAPS
25.0000 mg | ORAL_CAPSULE | Freq: Once | ORAL | Status: AC
  Administered 2017-04-12: 25 mg via ORAL
  Filled 2017-04-12: qty 1

## 2017-04-12 MED ORDER — HYDROCERIN EX CREA
TOPICAL_CREAM | Freq: Two times a day (BID) | CUTANEOUS | Status: DC
  Administered 2017-04-12 – 2017-04-25 (×26): via TOPICAL
  Filled 2017-04-12: qty 113

## 2017-04-12 MED ORDER — PRO-STAT SUGAR FREE PO LIQD
30.0000 mL | Freq: Two times a day (BID) | ORAL | Status: DC
  Administered 2017-04-12 – 2017-04-25 (×26): 30 mL via ORAL
  Filled 2017-04-12 (×26): qty 30

## 2017-04-12 MED ORDER — SODIUM CHLORIDE 0.9 % IV SOLN
Freq: Once | INTRAVENOUS | Status: AC
  Administered 2017-04-12: 15:00:00 via INTRAVENOUS

## 2017-04-12 MED ORDER — ACETAMINOPHEN 325 MG PO TABS
650.0000 mg | ORAL_TABLET | Freq: Once | ORAL | Status: AC
  Administered 2017-04-12: 650 mg via ORAL
  Filled 2017-04-12: qty 2

## 2017-04-12 MED ORDER — FUROSEMIDE 10 MG/ML IJ SOLN
20.0000 mg | Freq: Once | INTRAMUSCULAR | Status: AC
  Administered 2017-04-12: 20 mg via INTRAVENOUS
  Filled 2017-04-12: qty 2

## 2017-04-12 MED ORDER — POLYETHYLENE GLYCOL 3350 17 G PO PACK
17.0000 g | PACK | Freq: Every day | ORAL | Status: DC
  Administered 2017-04-12 – 2017-04-24 (×12): 17 g via ORAL
  Filled 2017-04-12 (×12): qty 1

## 2017-04-12 NOTE — Progress Notes (Signed)
CRITICAL VALUE ALERT  Critical Value:  Hemoglobin 6.7  Date & Time Notied:  04/12/17 0950  Provider Notified: Algis Liming, PA  Orders Received/Actions taken: Stat recheck CBC- to do bedside therapies until confirmed with redraw.  Brita Romp, RN

## 2017-04-12 NOTE — Progress Notes (Signed)
Physical Medicine and Rehabilitation Consult Reason for Consult: Decreased functional mobility Referring Physician: Dr. Sharol Given   HPI: [name] [name] is a 41 y.o. right handed female with history of bipolar disorder, diabetes mellitus peripheral neuropathy, hypertension, OSA on CPAP, CKD stage III, morbid obesity as well as necrotizing fasciitis of the groin and debridement of left second toe ulcer. Recent admission 03/24/2017- 04/02/2017 for severe sepsis secondary to left lower extremity cellulitis. Per chart review and patient, patient lives with her disabled mother who has an amputation, uncle and nephew. 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 Presented 04/07/2017 with decreasing ischemic changes of left lower extremity as well as necrotic abscess. Wound was not felt to be salvageable and underwent transtibial amputation 04/07/2017 with placement of wound VAC per Dr. Sharol Given. Hospital course pain management. Physical therapy evaluation completed with recommendations of physical medicine rehabilitation consult.   Review of Systems  Constitutional: Negative for chills.  HENT: Negative for hearing loss.   Eyes: Negative for blurred vision and double vision.  Respiratory: Positive for shortness of breath. Negative for cough.   Cardiovascular: Positive for leg swelling. Negative for chest pain and palpitations.  Gastrointestinal: Negative for nausea and vomiting.       GERD  Genitourinary: Negative for flank pain and hematuria.  Musculoskeletal: Positive for back pain, joint pain and myalgias.  Skin: Negative for rash.  Neurological: Negative for seizures.  Psychiatric/Behavioral:       Anxiety, bipolar disorder  All other systems reviewed and are negative.      Past Medical History:  Diagnosis Date  . Abnormal Pap smear of cervix 2009  . Abscess    history of multiple abscesses  . Anemia of chronic disease 2002  . Anxiety   . Bipolar disorder ([place])   . Cellulitis 05/21/2014     right eye  . Chronic bronchitis ([place])    "get it q yr" (05/13/2013)  . Chronic pain   . Depression   . Edema of lower extremity   . Endocarditis 2002   subacute bacterial endocarditis.   . Family history of anesthesia complication    "my mom has a hard time coming out from under"  . Fibromyalgia   . GERD (gastroesophageal reflux disease)   . Heart murmur   . History of blood transfusion    "just low blood count" (05/13/2013)  . Hyperlipidemia   . Hypertension   . Hypothyroidism   . Hypothyroidism, adult 03/21/2014  . Necrosis ([place])    and ulceration  . Obesity   . OSA on CPAP    does not wear CPAP  . Peripheral neuropathy   . Type II diabetes mellitus ([place])         Past Surgical History:  Procedure Laterality Date  . DEBRIDEMENT OF SKIN & SOFT TISSUE; DRESSING CHANGE UNDER ANESTHESIA Left 07/14/2012   Performed by Gayland Curry, MD,FACS at [facility], THIGH WOUND Left 07/09/2012   Performed by Harl , MD at [facility]  . I&D Left Thigh Left 07/16/2012   Performed by Gwenyth Ober, MD at [facility] OR  . INCISION AND DRAINAGE ABSCESS     multiple I&Ds  . IRRIGATION AND DEBRIDEMENT ABSCESS Left 08/10/2012   Performed by Madilyn Hook, DO at [facility]  . IRRIGATION AND DEBRIDEMENT ABSCESS BUTTOCKS AND THIGH Left 07/06/2012   Performed by Shann Medal, MD at [facility]  . IRRIGATION AND DEBRIDEMENT PERIRECTAL ABSCESS N/A 01/05/2015   Performed by  Tsuei, Matthew, MD at [facility] OR  . IRRIGATION AND DEBRIDEMENT WOUND Left 07/07/2012   Performed by Blackman, Douglas A, MD at [facility] OR  . LEFT BELOW KNEE AMPUTATION Left 04/07/2017   Performed by Duda, Marcus V, MD at [facility] OR  . TONSILLECTOMY  1994        Family History  Problem Relation Age of Onset  . Heart failure Mother   . Diabetes Mother   . Kidney disease Mother   . Kidney disease Father   . Diabetes Father   . Diabetes Paternal Grandmother   . Heart failure Paternal  Grandmother   . Other Other    Social History:  reports that she has been smoking cigarettes.  She has a 3.00 pack-year smoking history. she has never used smokeless tobacco. She reports that she drinks alcohol. She reports that she does not use drugs. Allergies:       Allergies  Allergen Reactions  . Vancomycin Other (See Comments)    Acute renal failure suspected secondary to vanco         Medications Prior to Admission  Medication Sig Dispense Refill  . amitriptyline (ELAVIL) 50 MG tablet Take 1 tablet (50 mg total) by mouth at bedtime. 90 tablet 3  . amoxicillin-clavulanate (AUGMENTIN) 875-125 MG tablet Take 1 tablet every 12 (twelve) hours by mouth. 12 tablet 0  . atorvastatin (LIPITOR) 20 MG tablet TAKE 1 TABLET BY MOUTH EVERY DAY 30 tablet 11  . cyclobenzaprine (FLEXERIL) 10 MG tablet Take 1 tablet (10 mg total) by mouth every 12 (twelve) hours as needed for muscle spasms. 60 tablet 3  . doxycycline (VIBRA-TABS) 100 MG tablet Take 1 tablet (100 mg total) every 12 (twelve) hours by mouth. 12 tablet 0  . furosemide (LASIX) 20 MG tablet Take 2 tablets (40 mg total) daily by mouth. 30 tablet 0  . HYDROcodone-acetaminophen (NORCO/VICODIN) 5-325 MG tablet Take 1-2 tablets every 4 (four) hours as needed by mouth for moderate pain. 10 tablet 0  . insulin glargine (LANTUS) 100 UNIT/ML injection Inject 44 units below the skin at bedtime. Diagnosis code:  E11.65, Z79.4 30 mL 1  . INVOKANA 300 MG TABS tablet Take 1 tablet by mouth daily before breakfast 30 tablet 3  . liraglutide (VICTOZA) 18 MG/3ML SOPN Inject 0.3 mLs (1.8 mg total) daily into the skin. 9 mL 3  . LYRICA 100 MG capsule TAKE 1 CAPSULE BY MOUTH 2 TIMES DAILY 60 capsule 0  . mupirocin ointment (BACTROBAN) 2 % Place 1 application 2 (two) times daily into the nose. 22 g 0  . NOVOLOG FLEXPEN 100 UNIT/ML FlexPen Inject 14 units below the skin 3 times daily with meals 15 mL 1  . prednisoLONE acetate (PRED FORTE) 1 % ophthalmic  suspension Place 1 drop into the right eye 4 (four) times daily. 10 mL 0  . VOLTAREN 1 % GEL Apply 4 grams to affected area (topically) 4 times daily 100 g 2  . ACCU-CHEK FASTCLIX LANCETS MISC Check blood sugar 3 times a day before meals 102 each 12  . Blood Glucose Monitoring Suppl (ACCU-CHEK AVIVA PLUS) w/Device KIT 1 each by Does not apply route 4 (four) times daily. 1 kit 1  . glucose blood (ACCU-CHEK AVIVA PLUS) test strip Check blood sugar 4 times a day before meals 150 each 3  . Insulin Pen Needle (B-D UF III MINI PEN NEEDLES) 31G X 5 MM MISC Use 3 per day to inject Insulin 100 each 5  . Insulin   Syringe-Needle U-100 31G X 15/64" 0.5 ML MISC Use to inject insulin as isntructed 100 each 6    Home: Home Living Family/patient expects to be discharged to:: Private residence Living Arrangements: Parent Available Help at Discharge: Family Type of Home: House Home Access: Stairs to enter Entrance Stairs-Number of Steps: 3 Entrance Stairs-Rails: Right, Left Home Layout: One level Bathroom Shower/Tub: Tub/shower unit Bathroom Toilet: Standard Home Equipment: None  Functional History: Prior Function Level of Independence: Independent Functional Status:  Mobility: Bed Mobility Overal bed mobility: Needs Assistance Bed Mobility: Supine to Sit Supine to sit: Min guard, HOB elevated(HOB elevated 90 degrees) Sit to supine: Mod assist General bed mobility comments: Assist for LLE initiation of movement. Pt very painful with any movement.  Transfers Overall transfer level: Needs assistance Equipment used: Rolling walker (2 wheeled) Transfers: Sit to/from Stand Sit to Stand: Mod assist, +2 physical assistance General transfer comment: cues for hand placement & Rt foot placement.  (A) to achieve standing and stabilize with standing.  Completed from bed & recliner requiring increased assistance from lower surface (recliner) Ambulation/Gait Ambulation/Gait assistance: Min assist, +2  safety/equipment Ambulation Distance (Feet): (4 hops + 6 hops) Assistive device: Rolling walker (2 wheeled) Gait Pattern/deviations: Step-to pattern General Gait Details: fatigues quickly.  difficulty clearing floor with Rt foot but able to take small hops forwards.   Gait velocity: Decreased Gait velocity interpretation: Below normal speed for age/gender  ADL:  Cognition: Cognition Overall Cognitive Status: Within Functional Limits for tasks assessed Orientation Level: Oriented X4 Cognition Arousal/Alertness: Awake/alert Behavior During Therapy: WFL for tasks assessed/performed Overall Cognitive Status: Within Functional Limits for tasks assessed  Blood pressure (!) 141/75, pulse (!) 110, temperature 99.3 F (37.4 C), temperature source Oral, resp. rate 16, height 5' 10" (1.778 m), weight (!) 159.6 kg (351 lb 13.7 oz), last menstrual period 04/06/2017, SpO2 92 %. Physical Exam  Constitutional: She is oriented to person, place, and time. She appears well-developed.  41-year-old right-handed obese female  HENT:  Head: Normocephalic and atraumatic.  Eyes: EOM are normal. Right eye exhibits no discharge. Left eye exhibits no discharge.  Neck: Normal range of motion. Neck supple. No thyromegaly present.  Cardiovascular: Regular rhythm and normal heart sounds.  +Tachycardia  Respiratory: Effort normal and breath sounds normal. No respiratory distress.  GI: Soft. Bowel sounds are normal. She exhibits no distension.  Musculoskeletal: She exhibits edema and tenderness.  Left BKA  Neurological: She is alert and oriented to person, place, and time.  Motor: B/l UE/RLE 5/5 proximal to distal LLE: HF 4/5 (pain inhibition) Sensation intact to light touch  Skin:  Left lower extremity amputation site with wound VAC in place Vascular changes distal RLE  Psychiatric: She has a normal mood and affect. Her behavior is normal.  Assessment/Plan: Diagnosis: Left BKA Labs independently  reviewed.  Records reviewed and summated above. Clean amputation daily with soap and water Monitor incision site for signs of infection or impending skin breakdown. Staples to remain in place for 3-4 weeks Stump shrinker, for edema control  Scar mobilization massaging to prevent soft tissue adherence Stump protector during therapies Prevent flexion contractures by implementing the following:              Encourage prone lying for 20-30 mins per day BID to avoid hip flexion       Contractures if medically appropriate;             Avoid pillow under knees when patient is lying in bed in order   to prevent both        knee and hip flexion contractures;             Avoid prolonged sitting Post surgical pain control with oral medication Phantom limb pain control with physical modalities including desensitization techniques (gentle self massage to the residual stump,hot packs if sensation intact, [place]) and mirror therapy, TENS. If ineffective, consider pharmacological treatment for neuropathic pain (e.g gabapentin, pregabalin, amytriptalyine, duloxetine).  When using wheelchair, patient should have knee on amputated side fully extended with board under the seat cushion. Avoid injury to contralateral side  1. Does the need for close, 24 hr/day medical supervision in concert with the patient's rehab needs make it unreasonable for this patient to be served in a less intensive setting? Yes  2. Co-Morbidities requiring supervision/potential complications: post-op pain management (Biofeedback training with therapies to help reduce reliance on opiate pain medications, particularly IV dilaudid, monitor pain control during therapies, and sedation at rest and titrate to maximum efficacy to ensure participation and gains in therapies), bipolar disorder (cont meds), diabetes mellitus peripheral neuropathy (Monitor in accordance with exercise and adjust meds as necessary), HTN (monitor and provide prns in accordance with  increased physical exertion and pain), OSA (cont CPAP, monitor for daytime somnolence), CKD stage III (avoid nephrotoxic meds), super obesity (Body mass index is 50.49 kg/m., diet and exercise education, encourage weight loss to increase endurance and promote overall health), leukocytosis (cont to monitor for signs and symptoms of infection, further workup if indicated), ABLA (transfuse if necessary to ensure appropriate perfusion for increased activity tolerance), hyperkalemia (cont to monitor, treat if necessary) 3. Due to safety, skin/wound care, disease management, pain management and patient education, does the patient require 24 hr/day rehab nursing? Yes 4. Does the patient require coordinated care of a physician, rehab nurse, PT (1-2 hrs/day, 5 days/week) and OT (1-2 hrs/day, 5 days/week) to address physical and functional deficits in the context of the above medical diagnosis(es)? Yes Addressing deficits in the following areas: balance, endurance, locomotion, strength, transferring, bathing, dressing, toileting and psychosocial support 5. Can the patient actively participate in an intensive therapy program of at least 3 hrs of therapy per day at least 5 days per week? Likely in the near future 6. The potential for patient to make measurable gains while on inpatient rehab is excellent 7. Anticipated functional outcomes upon discharge from inpatient rehab are supervision and min assist  with PT, supervision and min assist with OT, n/a with SLP. 8. Estimated rehab length of stay to reach the above functional goals is: 14-18 days. 9. Anticipated D/C setting: Other 10. Anticipated post D/C treatments: SNF 11. Overall Rehab/Functional Prognosis: good  RECOMMENDATIONS: This patient's condition is appropriate for continued rehabilitative care in the following setting: Pt is currently the caregiver for her mother, which she will not be able to return to immediately post discharge.  Will need to explore  other options as well as wait improved pain control. Patient has agreed to participate in recommended program. Yes Note that insurance prior authorization may be required for reimbursement for recommended care.  Comment: Rehab Admissions Coordinator to follow up.  Delice Lesch, MD, ABPMR Lavon Paganini Angiulli, PA-C 04/10/2017          Revision History                   Routing History

## 2017-04-12 NOTE — IPOC Note (Signed)
Overall Plan of Care [facility]) Patient Details Name: [name] [name] MRN: [id number] DOB: June 01, 1975  Admitting Diagnosis: Left BKA  Hospital Problems: Active Problems:   Status post below knee amputation, left (HCC)   Unilateral complete BKA, left, sequela (HCC)   Poorly controlled type 2 diabetes mellitus with peripheral neuropathy ([place])   Morbid obesity ([place])     Functional Problem List: Nursing Edema, Endurance, Medication Management, Pain, Safety  PT Balance, Sensory, Behavior, Skin Integrity, Edema, Endurance, Motor, Nutrition, Pain  OT Balance, Edema, Endurance, Pain, Safety  SLP    TR         Basic ADL's: OT Grooming, Bathing, Dressing, Toileting     Advanced  ADL's: OT Simple Meal Preparation, Light Housekeeping     Transfers: PT Bed Mobility, Car, Furniture, Bed to Chair, Editor, commissioning, Agricultural engineer: PT Ambulation, Emergency planning/management officer, Stairs     Additional Impairments: OT    SLP        TR      Anticipated Outcomes Item Anticipated Outcome  Self Feeding independent  Swallowing      Basic self-care  supervision to modified independent  Toileting  supervision to modified independent   Bathroom Transfers supervision to modified indepenedent  Bowel/Bladder  min assist  Transfers  Mod I  Locomotion  supervision  Communication     Cognition     Pain  4 or less  Safety/Judgment  min assist   Therapy Plan: PT Intensity: Minimum of 1-2 x/day ,45 to 90 minutes PT Frequency: 5 out of 7 days PT Duration Estimated Length of Stay: 10-14 days OT Intensity: Minimum of 1-2 x/day, 45 to 90 minutes OT Frequency: 5 out of 7 days OT Duration/Estimated Length of Stay: 10 days      Team Interventions: Nursing Interventions Patient/Family Education, Disease Management/Prevention, Skin Care/Wound Management, Medication Management, Pain Management  PT interventions Ambulation/gait training, Discharge planning, Functional mobility  training, Therapeutic Activities, Psychosocial support, Balance/vestibular training, Disease management/prevention, Neuromuscular re-education, Skin care/wound management, Therapeutic Exercise, Wheelchair propulsion/positioning, Cognitive remediation/compensation, DME/adaptive equipment instruction, Pain management, Splinting/orthotics, UE/LE Strength taining/ROM, Community reintegration, Technical sales engineer stimulation, Barrister's clerk education, IT trainer, UE/LE Coordination activities  OT Interventions Training and development officer, Academic librarian, Engineer, drilling, UE/LE Strength taining/ROM, Barrister's clerk education, Functional mobility training, Discharge planning, Self Care/advanced ADL retraining, Therapeutic Activities, UE/LE Coordination activities  SLP Interventions    TR Interventions    SW/CM Interventions Discharge Planning, Psychosocial Support, Patient/Family Education   Barriers to Discharge MD  Medical stability and ABLA  Nursing      PT Decreased caregiver support, Inaccessible home environment 2 STE  OT      SLP      SW Decreased caregiver support, Inaccessible home environment no caregiver at home, mom is disabled   Team Discharge Planning: Destination: PT-Home ,OT- Home , SLP-  Projected Follow-up: PT-Home health PT, OT-  Home health OT, SLP-  Projected Equipment Needs: PT-To be determined, OT- To be determined, SLP-  Equipment Details: PT- , OT-  Patient/family involved in discharge planning: PT- Patient,  OT-Patient, SLP-   MD ELOS: 10-12 days. Medical Rehab Prognosis:  Good Assessment: 41 y.o. right handed female with history of bipolar disorder, diabetes mellitus peripheral neuropathy, hypertension, OSA on CPAP, CKD stage III, morbid obesity as well as necrotizing fasciitis of the groin and debridement of left second toe ulcer. Recent admission 03/24/2017- 04/02/2017 for severe sepsis secondary to left lower extremity cellulitis  Presented 04/07/2017 with decreasing ischemic changes of  left lower extremity as well as necrotic abscess. Wound was not felt to be salvageable and underwent transtibial amputation 04/07/2017 with placement of wound VAC per Dr. Sharol Given. Hospital course pain management. MRSA PCR screening positive maintained on contact precautions. Pt with resulting functional deficits wit mobility, safety, self-care.  Will set goals for Supervision/Mod I with PT/OT.   See Team Conference Notes for weekly updates to the plan of care

## 2017-04-12 NOTE — Patient Care Conference (Signed)
Inpatient RehabilitationTeam Conference and Plan of Care Update Date: 04/12/2017   Time: 11:20 AM    Patient Name: [name] [name]      Medical Record Number: [id number]  Date of Birth: Nov 09, 1975 Sex: Female         Room/Bed: 4M07C/4M07C-01 Payor Info: Payor: MEDICARE / Plan: MEDICARE PART A AND B / Product Type: *No Product type* /    Admitting Diagnosis: LT BKA  Admit Date/Time:  04/11/2017  6:29 PM Admission Comments: No comment available   Primary Diagnosis:  <principal problem not specified> Principal Problem: <principal problem not specified>  Patient Active Problem List   Diagnosis Date Noted  . Unilateral complete BKA, left, sequela ([place])   . Poorly controlled type 2 diabetes mellitus with peripheral neuropathy ([place])   . Morbid obesity ([place])   . Status post below knee amputation, left ([place]) 04/11/2017  . Unilateral complete BKA, left, subsequent encounter ([place])   . Post-operative pain   . Bipolar affective disorder ([place])   . Type 2 diabetes mellitus with peripheral neuropathy (HCC)   . Benign essential HTN   . OSA (obstructive sleep apnea)   . Super obese   . Leukocytosis   . Acute blood loss anemia   . Below knee amputation status, left ([place]) 04/07/2017  . Cutaneous abscess of left foot   . Adjustment disorder with depressed mood 03/26/2017  . Left leg cellulitis   . Severe sepsis ([place]) 03/24/2017  . Hyponatremia 03/24/2017  . Abnormal ECG 03/24/2017  . Sepsis ([place]) 03/24/2017  . Acute bilateral low back pain 02/13/2017  . Heart failure with preserved ejection fraction ([place]) 02/09/2017  . Jerking movements of extremities 12/08/2016  . Routine adult health maintenance 12/08/2016  . Chronic pain of right knee 09/08/2016  . Abdominal muscle pain 09/08/2016  . Vertigo 08/05/2016  . Lightheadedness 07/15/2016  . CKD (chronic kidney disease) stage 2, GFR 60-89 ml/min 07/15/2016  . Bilateral lower extremity edema 05/13/2016  . OSA on CPAP 05/13/2016  .  Vaginal pruritus 03/14/2016  . Morbid obesity due to excess calories ([place]) 11/27/2015  . Callus of foot 11/26/2015  . History of Low serum cortisol level ([place]) 01/07/2015  . Financial difficulty 04/25/2013  . Uncontrolled type 2 diabetes mellitus ([place]) 07/12/2012  . Necrotizing fasciitis s/p OR debridements 07/06/2012  . Carpal tunnel syndrome, bilateral 01/25/2012  . Diabetic peripheral neuropathy ([place]) 07/04/2011  . Anxiety and depression 06/02/2011  . History of abnormal cervical Pap smear 12/13/2007  . GERD 12/06/2007  . Hyperlipidemia 08/29/2007  . Essential hypertension 08/29/2007    Expected Discharge Date:    Team Members Present: Physician leading conference: Dr. Delice Lesch Social Worker Present: Ovidio Kin, LCSW Nurse Present: Junius Creamer, RN PT Present: Michaelene Song, PT OT Present: Clyda Greener, OT SLP Present: Weston Anna, SLP PPS Coordinator present : Daiva Nakayama, RN, CRRN     Current Status/Progress Goal Weekly Team Focus  Medical   Decreased functional mobility secondary to left transtibial amputation 04/07/2017  Improve mobility, HTN/DM, anemia  See above   Bowel/Bladder   continent of bowel and bladder LBM 11-20  remain continent of bowel and bladder, maintain regular bowel patter  Assist with toileting needs   Swallow/Nutrition/ Hydration             ADL's     eval pending        Mobility   min to mod assist for sit<>stands, supervision bed mobility, supervision w/c propulsion  supervision overall  gait,  standing balance, transfers, w/c propulsion, d/c planning   Communication             Safety/Cognition/ Behavioral Observations            Pain   patient has oxycodone patient states that pain isn't  pain <6  Assess pain q shift and prn and treat as needed   Skin   MASD to peri area, right heel dark area, dry skin  no new skin breakdown  Assess skin q shift and prn       *See Care Plan and progress notes for long and short-term  goals.     Barriers to Discharge  Current Status/Progress Possible Resolutions Date Resolved   Physician    Medical stability;Decreased caregiver support;Wound Care;Lack of/limited family support;Other (comments)  Morbid obesity  See above  Therapies, optimize DM/HTN, follow labs, obesity, may need transufsion      Nursing                  PT  Decreased caregiver support;Inaccessible home environment  2 STE               OT                  SLP                SW                Discharge Planning/Teaching Needs:    Home with disabled mom who she was the caregiver for. Will need to be mod/i before going home     Team Discussion:  Goals supervision level, being evaluated today in therapies. Wound Vac on amputated leg. MD watching hemoglobin   Revisions to Treatment Plan:  New eval    Continued Need for Acute Rehabilitation Level of Care: The patient requires daily medical management by a physician with specialized training in physical medicine and rehabilitation for the following conditions: Daily direction of a multidisciplinary physical rehabilitation program to ensure safe treatment while eliciting the highest outcome that is of practical value to the patient.: Yes Daily medical management of patient stability for increased activity during participation in an intensive rehabilitation regime.: Yes Daily analysis of laboratory values and/or radiology reports with any subsequent need for medication adjustment of medical intervention for : Post surgical problems;Diabetes problems;Blood pressure problems;Renal problems;Other  Lianne Carreto, Gardiner Rhyme 04/12/2017, 12:12 PM

## 2017-04-12 NOTE — Progress Notes (Signed)
[facility]IC[facility]ILITATION     PROGRESS NOTE  Subjective/Complaints:  Patient seen sitting up at the edge of her bed this morning. She states she slept well overnight. She states she is ready to begin therapies today.  ROS: Denies CP, SOB, nausea, vomiting, diarrhea.  Objective: Vital Signs: Blood pressure 130/71, pulse 94, temperature 97.8 F (36.6 C), temperature source Oral, resp. rate 18, height 5\' 10"  (1.778 m), weight (!) 157.9 kg (348 lb 1.7 oz), last menstrual period 04/06/2017, SpO2 100 %. No results found. No results for input(s): WBC, HGB, HCT, PLT in the last 72 hours. No results for input(s): NA, K, CL, GLUCOSE, BUN, CREATININE, CALCIUM in the last 72 hours.  Invalid input(s): CO CBG (last 3)  Recent Labs    04/11/17 1737 04/11/17 2204 04/12/17 0642  GLUCAP 168* 175* 207*    Wt Readings from Last 3 Encounters:  04/11/17 (!) 157.9 kg (348 lb 1.7 oz)  04/07/17 (!) 159.6 kg (351 lb 13.7 oz)  03/29/17 (!) 159.6 kg (351 lb 13.7 oz)    Physical Exam:  BP 130/71 (BP Location: Left Arm)   Pulse 94   Temp 97.8 F (36.6 C) (Oral)   Resp 18   Ht 5\' 10"  (1.778 m)   Wt (!) 157.9 kg (348 lb 1.7 oz)   LMP 04/06/2017 (Exact Date)   SpO2 100%   BMI 49.95 kg/m  Gen: NAD. Vital signs reviewed. Obese. HENT: Normocephalic. Atraumatic. Eyes: EOM are normal.  no discharge.  Cardiovascular: Normal rate, regular rhythm. No JVD.  Respiratory: Breath sounds normal. No respiratory distress.   GI: Bowel sounds are normal. She exhibits no distension.  Musculoskeletal: She exhibits edema and tenderness (stump).  Neurological: She is alert and oriented.  Motor: B/l UE/RLE 5/5 proximal to distal LLE: HF 4+/5 (pain inhibition) Skin. Wound VAC in place to left lower extremity.  Psychiatric: She has a normal mood and affect. Her behavior is normal.   Assessment/Plan: 1. Functional deficits secondary to left transtibial amputation 04/07/2017 which require 3+ hours  per day of interdisciplinary therapy in a comprehensive inpatient rehab setting. Physiatrist is providing close team supervision and 24 hour management of active medical problems listed below. Physiatrist and rehab team continue to assess barriers to discharge/monitor patient progress toward functional and medical goals.  Function:  Bathing Bathing position      Bathing parts      Bathing assist        Upper Body Dressing/Undressing Upper body dressing                    Upper body assist        Lower Body Dressing/Undressing Lower body dressing                                  Lower body assist        Toileting Toileting          Toileting assist     Transfers Chair/bed transfer             Locomotion Ambulation           Wheelchair          Cognition Comprehension Comprehension assist level: Follows complex conversation/direction with no assist  Expression Expression assist level: Expresses complex ideas: With no assist  Social Interaction Social Interaction assist level: Interacts appropriately with others - No medications needed.  Problem Solving Problem solving assist level: Solves complex problems: Recognizes & self-corrects  Memory Memory assist level: Complete Independence: No helper    Medical Problem List and Plan:  1. Decreased functional mobility secondary to left transtibial amputation 04/07/2017    Begin CIR 2. DVT Prophylaxis/Anticoagulation: SCD right lower extremity  3. Pain Management: Lyrica 100 mg twice a day, oxycodone and Robaxin as needed  4. Mood: Elavil 50 mg daily at bedtime  5. Neuropsych: This patient is capable of making decisions on her own behalf.  6. Skin/Wound Care: Routine skin checks  7. Fluids/Electrolytes/Nutrition: Routine I&O's  8. MRSA PCR screening positive. Contact precautions  9. Diabetes mellitus and peripheral neuropathy. Hemoglobin A1c 9.0. NovoLog 6 units 3 times a day, Lantus  insulin 30 units daily at bedtime. Diabetic teaching    Monitor with increased mobility 10. CKD stage III. Continue low dose Lasix.    Labs pending for today 11. Hypertension.    Monitor with increased mobility  12. Constipation. Laxative assistance  13. Hyperlipidemia. Lipitor  14. Morbid obesity   Body mass index is 49.95 kg/m.   Diet and exercise education   Will cont to encourage weight loss to increase endurance and promote overall health   LOS (Days) 1 A FACE TO FACE EVALUATION WAS PERFORMED  Ankit Lorie Phenix 04/12/2017 7:44 AM

## 2017-04-12 NOTE — Progress Notes (Signed)
Physical Therapy Note  Patient Details  Name: [name] [name] MRN: [id number] Date of Birth: 07-11-1975 Today's Date: 04/12/2017  0915-100, 45 min individual tx Pain: 5/10 residual limb; premedicated  W/c propulsion on level tile x 150' with supervision.  Stand pivot with RW min guard assist.  Seated Therapeutic exercise performed with LE to increase strength for functional mobility: 10 x 1 L short arc/ R long arc quad knee extensions, bil hip adduction against resistance, bil glut sets, R heel raises. Pt appeared to doze off during exs; improved with counting aloud with PT.    Whitney, RN arrived and stated pt has low Hb and needs to be room for additional blood draw.  She ok'd pt staying up in w/c.  Pt left resting in w/c with all needs within reach.  See function navigator for current status.   Velna Hedgecock 04/12/2017, 7:55 AM

## 2017-04-12 NOTE — Evaluation (Signed)
Occupational Therapy Assessment and Plan  Patient Details  Name: [name] [name] MRN: [id number] Date of Birth: 05/08/1976  OT Diagnosis: acute pain and muscle weakness (generalized) Rehab Potential: Rehab Potential (ACUTE ONLY): Excellent ELOS: 10 days   Today's Date: 04/12/2017 OT Individual Time: 6811-5726 OT Individual Time Calculation (min): 60 min     Problem List:  Patient Active Problem List   Diagnosis Date Noted  . Unilateral complete BKA, left, sequela ([place])   . Poorly controlled type 2 diabetes mellitus with peripheral neuropathy ([place])   . Morbid obesity ([place])   . Status post below knee amputation, left ([place]) 04/11/2017  . Unilateral complete BKA, left, subsequent encounter ([facility])   . Post-operative pain   . Bipolar affective disorder ([place])   . Type 2 diabetes mellitus with peripheral neuropathy ([facility])   . Benign essential HTN   . OSA (obstructive sleep apnea)   . Super obese   . Leukocytosis   . Acute blood loss anemia   . Below knee amputation status, left ([place]) 04/07/2017  . Cutaneous abscess of left foot   . Adjustment disorder with depressed mood 03/26/2017  . Left leg cellulitis   . Severe sepsis ([place]) 03/24/2017  . Hyponatremia 03/24/2017  . Abnormal ECG 03/24/2017  . Sepsis ([place]) 03/24/2017  . Acute bilateral low back pain 02/13/2017  . Heart failure with preserved ejection fraction ([place]) 02/09/2017  . Jerking movements of extremities 12/08/2016  . Routine adult health maintenance 12/08/2016  . Chronic pain of right knee 09/08/2016  . Abdominal muscle pain 09/08/2016  . Vertigo 08/05/2016  . Lightheadedness 07/15/2016  . CKD (chronic kidney disease) stage 2, GFR 60-89 ml/min 07/15/2016  . Bilateral lower extremity edema 05/13/2016  . OSA on CPAP 05/13/2016  . Vaginal pruritus 03/14/2016  . Morbid obesity due to excess calories ([place]) 11/27/2015  . Callus of foot 11/26/2015  . History of Low serum cortisol level ([place]) 01/07/2015  . Financial  difficulty 04/25/2013  . Uncontrolled type 2 diabetes mellitus ([place]) 07/12/2012  . Necrotizing fasciitis s/p OR debridements 07/06/2012  . Carpal tunnel syndrome, bilateral 01/25/2012  . Diabetic peripheral neuropathy ([place]) 07/04/2011  . Anxiety and depression 06/02/2011  . History of abnormal cervical Pap smear 12/13/2007  . GERD 12/06/2007  . Hyperlipidemia 08/29/2007  . Essential hypertension 08/29/2007    Past Medical History:  Past Medical History:  Diagnosis Date  . Abnormal Pap smear of cervix 2009  . Abscess    history of multiple abscesses  . Anemia of chronic disease 2002  . Anxiety   . Bipolar disorder ([place])   . Cellulitis 05/21/2014   right eye  . Chronic bronchitis ([place])    "get it q yr" (05/13/2013)  . Chronic pain   . Depression   . Edema of lower extremity   . Endocarditis 2002   subacute bacterial endocarditis.   . Family history of anesthesia complication    "my mom has a hard time coming out from under"  . Fibromyalgia   . GERD (gastroesophageal reflux disease)   . Heart murmur   . History of blood transfusion    "just low blood count" (05/13/2013)  . Hyperlipidemia   . Hypertension   . Hypothyroidism   . Hypothyroidism, adult 03/21/2014  . Necrosis ([place])    and ulceration  . Obesity   . OSA on CPAP    does not wear CPAP  . Peripheral neuropathy   . Type II diabetes mellitus [facility])    Past Surgical  History:  Past Surgical History:  Procedure Laterality Date  . AMPUTATION Left 04/07/2017   Procedure: LEFT BELOW KNEE AMPUTATION;  Surgeon: Newt Minion, MD;  Location: [facility];  Service: Orthopedics;  Laterality: Left;  . INCISION AND DRAINAGE ABSCESS     multiple I&Ds  . INCISION AND DRAINAGE ABSCESS Left 07/09/2012   Procedure: DRESSING CHANGE, THIGH WOUND;  Surgeon: Harl Bowie, MD;  Location: [facility];  Service: General;  Laterality: Left;  . INCISION AND DRAINAGE OF WOUND Left 07/07/2012   Procedure: IRRIGATION AND DEBRIDEMENT WOUND;   Surgeon: Harl Bowie, MD;  Location: [facility];  Service: General;  Laterality: Left;  . INCISION AND DRAINAGE PERIRECTAL ABSCESS Left 07/14/2012   Procedure: DEBRIDEMENT OF SKIN & SOFT TISSUE; DRESSING CHANGE UNDER ANESTHESIA;  Surgeon: Gayland Curry, MD,FACS;  Location: [facility];  Service: General;  Laterality: Left;  . INCISION AND DRAINAGE PERIRECTAL ABSCESS Left 07/16/2012   Procedure: I&D Left Thigh;  Surgeon: Gwenyth Ober, MD;  Location: [facility];  Service: General;  Laterality: Left;  . INCISION AND DRAINAGE PERIRECTAL ABSCESS N/A 01/05/2015   Procedure: IRRIGATION AND DEBRIDEMENT PERIRECTAL ABSCESS;  Surgeon: Donnie Mesa, MD;  Location: [facility];  Service: General;  Laterality: N/A;  . IRRIGATION AND DEBRIDEMENT ABSCESS Left 07/06/2012   Procedure: IRRIGATION AND DEBRIDEMENT ABSCESS BUTTOCKS AND THIGH;  Surgeon: Shann Medal, MD;  Location: [facility];  Service: General;  Laterality: Left;  . IRRIGATION AND DEBRIDEMENT ABSCESS Left 08/10/2012   Procedure: IRRIGATION AND DEBRIDEMENT ABSCESS;  Surgeon: Madilyn Hook, DO;  Location: [facility];  Service: General;  Laterality: Left;  . TONSILLECTOMY  1994    Assessment & Plan Clinical Impression: Patient is a 41 y.o. year old female with recent admission to the hospital on 04/07/2017 with decreasing ischemic changes of left lower extremity as well as necrotic abscess. Wound was not felt to be salvageable and underwent transtibial amputation 04/07/2017 with placement of wound VAC per Dr. Sharol Given.  Patient transferred to CIR on 04/11/2017 .    Patient currently requires min with basic self-care skills secondary to muscle weakness and decreased standing balance and decreased balance strategies.  Prior to hospitalization, patient could complete ADLs with independent .  Patient will benefit from skilled intervention to decrease level of assist with basic self-care skills and increase independence with basic self-care skills prior to discharge home with care partner.   Anticipate patient will require intermittent supervision and follow up home health.  OT - End of Session Activity Tolerance: Decreased this session Endurance Deficit: Yes OT Assessment Rehab Potential (ACUTE ONLY): Excellent OT Patient demonstrates impairments in the following area(s): Balance;Edema;Endurance;Pain;Safety OT Basic ADL's Functional Problem(s): Grooming;Bathing;Dressing;Toileting OT Advanced ADL's Functional Problem(s): Simple Meal Preparation;Light Housekeeping OT Transfers Functional Problem(s): Tub/Shower;Toilet OT Plan OT Intensity: Minimum of 1-2 x/day, 45 to 90 minutes OT Frequency: 5 out of 7 days OT Duration/Estimated Length of Stay: 10 days OT Treatment/Interventions: Balance/vestibular training;Community reintegration;DME/adaptive equipment instruction;UE/LE Strength taining/ROM;Patient/family education;Functional mobility training;Discharge planning;Self Care/advanced ADL retraining;Therapeutic Activities;UE/LE Coordination activities OT Self Feeding Anticipated Outcome(s): independent OT Basic Self-Care Anticipated Outcome(s): supervision to modified independent OT Toileting Anticipated Outcome(s): supervision to modified independent OT Bathroom Transfers Anticipated Outcome(s): supervision to modified indepenedent OT Recommendation Patient destination: Home Follow Up Recommendations: Home health OT Equipment Recommended: To be determined   Skilled Therapeutic Intervention Pt began working on selfcare retraining sit to stand at the sink.  Min assist for stand pivot transfer to the wheelchair with use of the RW.  She was able to complete all bathing with overall min assist, including sit to stand for washing peri area.  Pt with no clothing other than what she wore in her to the hospital, so hospital gowns worn.  Therapist donned ace wraps to RLE secondary to edema and then pt was able to donn gripper sock with use of stool to prop her foot on and increased time to  complete task.  She was able to complete stand pivot transfer with use of the grab bar to the drop arm commode with min assist.  Completed session with transition out of the bathroom via wheelchair to the bedside.  Nursing in room to assist with changing of bed linens and to assist with transfer back to bed.  Pt's friend also present at end of session.  Pt with no report of symptoms of dizziness of fatigue during session.  BP in sitting 130/75.    OT Evaluation Precautions/Restrictions  Precautions Precautions: Fall Precaution Comments: L BKA, wound vac Restrictions Weight Bearing Restrictions: No LLE Weight Bearing: Non weight bearing   Vital Signs Therapy Vitals Temp: 98.9 F (37.2 C) Temp Source: Oral Pulse Rate: (!) 118 Resp: 19 BP: (!) 143/75 Patient Position (if appropriate): Sitting Oxygen Therapy SpO2: 99 % O2 Device: Not Delivered Pain Pain Assessment Pain Assessment: 0-10 Pain Score: 7  Pain Type: Surgical pain Pain Location: Leg Pain Orientation: Left Pain Descriptors / Indicators: Discomfort;Grimacing Pain Onset: With Activity Pain Intervention(s): Repositioned Home Living/Prior Functioning Home Living Living Arrangements: Parent, Other relatives Available Help at Discharge: Family Type of Home: House Home Access: Stairs to enter [employer] of Steps: 2 Entrance Stairs-Rails: Right, Left Home Layout: One level Bathroom Shower/Tub: Tub/shower unit, Architectural technologist: Standard Bathroom Accessibility: Yes Additional Comments: May have to turn walker sideways at door entry of bathroom.  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 Comments: ADLs, IADLs, no driving; family and medicaid transport for community mobility; enjoys shopping, painting/drawing, reading, and sewing ADL  See Function Section of chart for details  Vision Baseline Vision/History: Retinopathy Patient Visual Report: No change from baseline Vision Assessment?: No apparent visual deficits Perception  Perception: Within Functional Limits Praxis Praxis: Intact Cognition Overall Cognitive Status: Within Functional Limits for tasks assessed Arousal/Alertness: Awake/alert Orientation Level: Place;Situation;Person Person: Oriented Place: Oriented Situation: Oriented Year: 2018 Month: November Day of Week: Correct Memory: Appears intact Immediate Memory Recall: Sock;Bed;Blue Memory Recall: Sock;Blue;Bed Memory Recall Sock: Without Cue Memory Recall Blue: Without Cue Memory Recall Bed: Without Cue Attention: Sustained;Selective Sustained Attention: Appears intact Selective Attention: Appears intact Awareness: Appears intact Problem Solving: Appears intact Safety/Judgment: Appears intact Sensation Sensation Light Touch: Appears Intact Stereognosis: Appears Intact Hot/Cold: Appears Intact Proprioception: Appears Intact Additional Comments: Sensation intact in BUEs Coordination Gross Motor Movements are Fluid and Coordinated: Yes Fine Motor Movements are Fluid and Coordinated: Yes Coordination and Movement Description: BUE FM and gross motor coordination WFLs. Motor  Motor Motor - Skilled Clinical Observations: generalized weakness Mobility  Transfers Transfers: Sit to Stand;Stand to Sit Sit to Stand: With upper extremity assist;From chair/3-in-1;With armrests;From toilet;4: Min assist Stand to Sit: 4: Min assist;With upper extremity assist;To chair/3-in-1;With armrests  Trunk/Postural Assessment  Cervical  Assessment Cervical Assessment: Within Functional Limits Thoracic  Assessment Thoracic Assessment: Exceptions to [facility] Lumbar Assessment Lumbar Assessment: Within Functional Limits Postural Control Postural Control: Within Functional Limits  Balance Balance Balance Assessed: Yes Static Sitting Balance Static Sitting - Balance Support: Feet supported Static Sitting - Level of Assistance: 6: Modified independent (Device/Increase time) Dynamic Sitting Balance Dynamic Sitting - Balance Support: During functional activity Dynamic Sitting - Level of Assistance: 5: Stand by assistance Static Standing Balance Static Standing - Balance Support: During functional activity Static Standing - Level of Assistance: 4: Min assist Dynamic Standing Balance Dynamic Standing - Balance Support: During functional activity Dynamic Standing - Level of Assistance: 4: Min assist Extremity/Trunk Assessment RUE Assessment RUE Assessment: Within Functional Limits LUE Assessment LUE Assessment: Within Functional Limits   See Function Navigator for Current Functional Status.   Refer to Care Plan for Long Term Goals  Recommendations for other services: None    Discharge Criteria: Patient will be discharged from OT if patient refuses treatment 3 consecutive times without medical reason, if treatment goals not met, if there is a change in medical status, if patient makes no progress towards goals or if patient is discharged from hospital.  The above assessment, treatment plan, treatment alternatives and goals were discussed and mutually agreed upon: by patient  Eulala Newcombe OTR/L 04/12/2017, 4:23 PM

## 2017-04-12 NOTE — Progress Notes (Addendum)
Brief Note:  Noted to have critical Hb value, will recheck.

## 2017-04-12 NOTE — Progress Notes (Signed)
Social Work Assessment and Plan Social Work Assessment and Plan  Patient Details  Name: [name] [name] MRN: [id number] Date of Birth: 1976/05/07  Today's Date: 04/12/2017  Problem List:  Patient Active Problem List   Diagnosis Date Noted  . Unilateral complete BKA, left, sequela ([place])   . Poorly controlled type 2 diabetes mellitus with peripheral neuropathy ([place])   . Morbid obesity ([place])   . Status post below knee amputation, left ([place]) 04/11/2017  . Unilateral complete BKA, left, subsequent encounter ([place])   . Post-operative pain   . Bipolar affective disorder ([place])   . Type 2 diabetes mellitus with peripheral neuropathy (HCC)   . Benign essential HTN   . OSA (obstructive sleep apnea)   . Super obese   . Leukocytosis   . Acute blood loss anemia   . Below knee amputation status, left ([place]) 04/07/2017  . Cutaneous abscess of left foot   . Adjustment disorder with depressed mood 03/26/2017  . Left leg cellulitis   . Severe sepsis ([place]) 03/24/2017  . Hyponatremia 03/24/2017  . Abnormal ECG 03/24/2017  . Sepsis ([place]) 03/24/2017  . Acute bilateral low back pain 02/13/2017  . Heart failure with preserved ejection fraction ([place]) 02/09/2017  . Jerking movements of extremities 12/08/2016  . Routine adult health maintenance 12/08/2016  . Chronic pain of right knee 09/08/2016  . Abdominal muscle pain 09/08/2016  . Vertigo 08/05/2016  . Lightheadedness 07/15/2016  . CKD (chronic kidney disease) stage 2, GFR 60-89 ml/min 07/15/2016  . Bilateral lower extremity edema 05/13/2016  . OSA on CPAP 05/13/2016  . Vaginal pruritus 03/14/2016  . Morbid obesity due to excess calories ([place]) 11/27/2015  . Callus of foot 11/26/2015  . History of Low serum cortisol level ([place]) 01/07/2015  . Financial difficulty 04/25/2013  . Uncontrolled type 2 diabetes mellitus ([place]) 07/12/2012  . Necrotizing fasciitis s/p OR debridements 07/06/2012  . Carpal tunnel syndrome, bilateral 01/25/2012  .  Diabetic peripheral neuropathy ([place]) 07/04/2011  . Anxiety and depression 06/02/2011  . History of abnormal cervical Pap smear 12/13/2007  . GERD 12/06/2007  . Hyperlipidemia 08/29/2007  . Essential hypertension 08/29/2007   Past Medical History:  Past Medical History:  Diagnosis Date  . Abnormal Pap smear of cervix 2009  . Abscess    history of multiple abscesses  . Anemia of chronic disease 2002  . Anxiety   . Bipolar disorder ([place])   . Cellulitis 05/21/2014   right eye  . Chronic bronchitis ([place])    "get it q yr" (05/13/2013)  . Chronic pain   . Depression   . Edema of lower extremity   . Endocarditis 2002   subacute bacterial endocarditis.   . Family history of anesthesia complication    "my mom has a hard time coming out from under"  . Fibromyalgia   . GERD (gastroesophageal reflux disease)   . Heart murmur   . History of blood transfusion    "just low blood count" (05/13/2013)  . Hyperlipidemia   . Hypertension   . Hypothyroidism   . Hypothyroidism, adult 03/21/2014  . Necrosis ([place])    and ulceration  . Obesity   . OSA on CPAP    does not wear CPAP  . Peripheral neuropathy   . Type II diabetes mellitus ([place])    Past Surgical History:  Past Surgical History:  Procedure Laterality Date  . AMPUTATION Left 04/07/2017   Procedure: LEFT BELOW KNEE AMPUTATION;  Surgeon: Newt Minion, MD;  Location: [facility]  OR;  Service: Orthopedics;  Laterality: Left;  . INCISION AND DRAINAGE ABSCESS     multiple I&Ds  . INCISION AND DRAINAGE ABSCESS Left 07/09/2012   Procedure: DRESSING CHANGE, THIGH WOUND;  Surgeon: Harl Bowie, MD;  Location: [place];  Service: General;  Laterality: Left;  . INCISION AND DRAINAGE OF WOUND Left 07/07/2012   Procedure: IRRIGATION AND DEBRIDEMENT WOUND;  Surgeon: Harl Bowie, MD;  Location: [facility] [facility];  Service: General;  Laterality: Left;  . INCISION AND DRAINAGE PERIRECTAL ABSCESS Left 07/14/2012   Procedure: DEBRIDEMENT OF SKIN & SOFT  TISSUE; DRESSING CHANGE UNDER ANESTHESIA;  Surgeon: Gayland Curry, MD,FACS;  Location: [facility] [facility];  Service: General;  Laterality: Left;  . INCISION AND DRAINAGE PERIRECTAL ABSCESS Left 07/16/2012   Procedure: I&D Left Thigh;  Surgeon: Gwenyth Ober, MD;  Location: [facility];  Service: General;  Laterality: Left;  . INCISION AND DRAINAGE PERIRECTAL ABSCESS N/A 01/05/2015   Procedure: IRRIGATION AND DEBRIDEMENT PERIRECTAL ABSCESS;  Surgeon: Donnie Mesa, MD;  Location: [facility];  Service: General;  Laterality: N/A;  . IRRIGATION AND DEBRIDEMENT ABSCESS Left 07/06/2012   Procedure: IRRIGATION AND DEBRIDEMENT ABSCESS BUTTOCKS AND THIGH;  Surgeon: Shann Medal, MD;  Location: [facility];  Service: General;  Laterality: Left;  . IRRIGATION AND DEBRIDEMENT ABSCESS Left 08/10/2012   Procedure: IRRIGATION AND DEBRIDEMENT ABSCESS;  Surgeon: Madilyn Hook, DO;  Location: [facility];  Service: General;  Laterality: Left;  . TONSILLECTOMY  1994   Social History:  reports that she has been smoking cigarettes.  She has a 3.00 pack-year smoking history. she has never used smokeless tobacco. She reports that she drinks alcohol. She reports that she does not use drugs.  Family / Support Systems Marital Status: Single Patient Roles: Caregiver, Other (Comment)(daughter) Other Supports: Karen-Mom [phone] Anticipated Caregiver: Self, caregiver for her mom Ability/Limitations of Caregiver: Mom needs care-amputation, blind and ESRD-uncle and nephew are in and out Caregiver Availability: Evenings only Family Dynamics: Close knit with family and social supports-friends. She feels she has good supports but wants to be mod/i and not need assist by discharge. Her uncle and nephew can not assist her. Mom is there but disabled herself.  Social History Preferred language: English Religion: Non-Denominational Cultural Background: No issues Education: High School Read: Yes Write: Yes Employment Status: Disabled Field seismologist Issues: No issues Guardian/Conservator: none-according to MD pt is capable of making her own decisions while here   Abuse/Neglect Abuse/Neglect Assessment Can Be Completed: Yes Physical Abuse: Denies Verbal Abuse: Denies Sexual Abuse: Denies Exploitation of patient/patient's resources: Denies Self-Neglect: Denies  Emotional Status Pt's affect, behavior adn adjustment status: Pt is motivated to do well and has always done for herself and Mom. She doesn't really have a choice due to she has no caregiver at [place] except herself. She is positive and adapt to challenges. She will give it her all while here. Recent Psychosocial Issues: other health issues Pyschiatric History: history of bipolar feels it is managed and not as much of an issue has her physical health has been. Would benefit from seeing neuro-psych while here for coping since she is young and has lost her leg. Will make referral Substance Abuse History: Tobacco realizes she needs to quit smoking and plans to now. Aware of community resources available to assist her with this  Patient / Family Perceptions, Expectations & Goals Pt/Family understanding of illness & functional limitations: Pt is able to explain her amputation. She voiced it happened so quick she  still is processing it. She talks with Dr Sharol Given and rehab MD and feels she has a good understanding of her condition and plan ahead of her. She is not one to shy away from the hard questions. Premorbid pt/family roles/activities: daughter, caregiver, niece, cousin, friend, etc Anticipated changes in roles/activities/participation: resume Pt/family expectations/goals: Pt states: " I need to be independent before I go home I will figure the rest out on my own."  Uncle states: " I hope she does well here."  [employer]: Other (Comment)(medicaid transportation) Premorbid Home Care/DME Agencies: Other (Comment)(RW and tub bench) Transportation  available at discharge: family and Medicaid transportation for MD appointments Resource referrals recommended: Neuropsychology, Support group (specify)  Discharge Planning Living Arrangements: Parent, Other relatives Support Systems: Parent, Other relatives, Friends/neighbors, Church/faith community Type of Residence: Private residence Insurance Resources: [employer] (specify county), [employer] Resources: [employer] Screen Referred: No Living Expenses: Education officer, community Management: Patient Does the patient have any problems obtaining your medications?: No Home Management: Patient and other family members Patient/Family Preliminary Plans: Return home with Mom and uncle, she will need to be mod/i since she has no caregvier at home. She was assisting with Mom's care prior to admission. Concern is the step she needs to get up and how she will do this.  Sw Barriers to Discharge: Decreased caregiver support, Inaccessible home environment Sw Barriers to Discharge Comments: no caregiver at home, mom is disabled Social Work Anticipated Follow Up Needs: HH/OP, Support Group  Clinical Impression Pleasant motivated female who is willing to do what she can to achieve her mod/i level. Her Mom, uncle and cousin are in and out but pt will need to be mod/i to return home. She is one who adapts to a new challenge. Feel she will benefit from seeing neuro-psych while here for coping. Await team's evaluations and goals.  Elease Hashimoto 04/12/2017, 2:35 PM

## 2017-04-12 NOTE — Evaluation (Signed)
Physical Therapy Assessment and Plan  Patient Details  Name: [name] [name] MRN: [id number] Date of Birth: [birth year]  PT Diagnosis: Difficulty walking, Edema and Muscle weakness Rehab Potential: Good ELOS: 10-14 days   Today's Date: 04/12/2017 PT Individual Time: 0803-0900  PT Individual Time Calculation (min): 57 min  Problem List:  Patient Active Problem List   Diagnosis Date Noted  . Unilateral complete BKA, left, sequela ([place])   . Poorly controlled type 2 diabetes mellitus with peripheral neuropathy ([place])   . Morbid obesity ([place])   . Status post below knee amputation, left ([place]) 04/11/2017  . Unilateral complete BKA, left, subsequent encounter ([place])   . Post-operative pain   . Bipolar affective disorder ([place])   . Type 2 diabetes mellitus with peripheral neuropathy (HCC)   . Benign essential HTN   . OSA (obstructive sleep apnea)   . Super obese   . Leukocytosis   . Acute blood loss anemia   . Below knee amputation status, left ([place]) 04/07/2017  . Cutaneous abscess of left foot   . Adjustment disorder with depressed mood 03/26/2017  . Left leg cellulitis   . Severe sepsis ([place]) 03/24/2017  . Hyponatremia 03/24/2017  . Abnormal ECG 03/24/2017  . Sepsis ([place]) 03/24/2017  . Acute bilateral low back pain 02/13/2017  . Heart failure with preserved ejection fraction ([place]) 02/09/2017  . Jerking movements of extremities 12/08/2016  . Routine adult health maintenance 12/08/2016  . Chronic pain of right knee 09/08/2016  . Abdominal muscle pain 09/08/2016  . Vertigo 08/05/2016  . Lightheadedness 07/15/2016  . CKD (chronic kidney disease) stage 2, GFR 60-89 ml/min 07/15/2016  . Bilateral lower extremity edema 05/13/2016  . OSA on CPAP 05/13/2016  . Vaginal pruritus 03/14/2016  . Morbid obesity due to excess calories ([place]) 11/27/2015  . Callus of foot 11/26/2015  . History of Low serum cortisol level ([place]) 01/07/2015  . Financial difficulty 04/25/2013  . Uncontrolled  type 2 diabetes mellitus ([place]) 07/12/2012  . Necrotizing fasciitis s/p OR debridements 07/06/2012  . Carpal tunnel syndrome, bilateral 01/25/2012  . Diabetic peripheral neuropathy ([place]) 07/04/2011  . Anxiety and depression 06/02/2011  . History of abnormal cervical Pap smear 12/13/2007  . GERD 12/06/2007  . Hyperlipidemia 08/29/2007  . Essential hypertension 08/29/2007    Past Medical History:  Past Medical History:  Diagnosis Date  . Abnormal Pap smear of cervix 2009  . Abscess    history of multiple abscesses  . Anemia of chronic disease 2002  . Anxiety   . Bipolar disorder ([place])   . Cellulitis 05/21/2014   right eye  . Chronic bronchitis ([place])    "get it q yr" (05/13/2013)  . Chronic pain   . Depression   . Edema of lower extremity   . Endocarditis 2002   subacute bacterial endocarditis.   . Family history of anesthesia complication    "my mom has a hard time coming out from under"  . Fibromyalgia   . GERD (gastroesophageal reflux disease)   . Heart murmur   . History of blood transfusion    "just low blood count" (05/13/2013)  . Hyperlipidemia   . Hypertension   . Hypothyroidism   . Hypothyroidism, adult 03/21/2014  . Necrosis ([place])    and ulceration  . Obesity   . OSA on CPAP    does not wear CPAP  . Peripheral neuropathy   . Type II diabetes mellitus ([place])    Past Surgical History:  Past Surgical History:  Procedure Laterality Date  . AMPUTATION Left 04/07/2017   Procedure: LEFT BELOW KNEE AMPUTATION;  Surgeon: Newt Minion, MD;  Location: [facility];  Service: Orthopedics;  Laterality: Left;  . INCISION AND DRAINAGE ABSCESS     multiple I&Ds  . INCISION AND DRAINAGE ABSCESS Left 07/09/2012   Procedure: DRESSING CHANGE, THIGH WOUND;  Surgeon: Harl Bowie, MD;  Location: [facility];  Service: General;  Laterality: Left;  . INCISION AND DRAINAGE OF WOUND Left 07/07/2012   Procedure: IRRIGATION AND DEBRIDEMENT WOUND;  Surgeon: Harl Bowie, MD;   Location: [facility];  Service: General;  Laterality: Left;  . INCISION AND DRAINAGE PERIRECTAL ABSCESS Left 07/14/2012   Procedure: DEBRIDEMENT OF SKIN & SOFT TISSUE; DRESSING CHANGE UNDER ANESTHESIA;  Surgeon: Gayland Curry, MD,FACS;  Location: [facility];  Service: General;  Laterality: Left;  . INCISION AND DRAINAGE PERIRECTAL ABSCESS Left 07/16/2012   Procedure: I&D Left Thigh;  Surgeon: Gwenyth Ober, MD;  Location: [facility];  Service: General;  Laterality: Left;  . INCISION AND DRAINAGE PERIRECTAL ABSCESS N/A 01/05/2015   Procedure: IRRIGATION AND DEBRIDEMENT PERIRECTAL ABSCESS;  Surgeon: Donnie Mesa, MD;  Location: [place] [facility];  Service: General;  Laterality: N/A;  . IRRIGATION AND DEBRIDEMENT ABSCESS Left 07/06/2012   Procedure: IRRIGATION AND DEBRIDEMENT ABSCESS BUTTOCKS AND THIGH;  Surgeon: Shann Medal, MD;  Location: [facility];  Service: General;  Laterality: Left;  . IRRIGATION AND DEBRIDEMENT ABSCESS Left 08/10/2012   Procedure: IRRIGATION AND DEBRIDEMENT ABSCESS;  Surgeon: Madilyn Hook, DO;  Location: [facility];  Service: General;  Laterality: Left;  . TONSILLECTOMY  1994    Assessment & Plan Clinical Impression: Patient is a 41 y.o. year old female with history of bipolar disorder, diabetes mellitus peripheral neuropathy, hypertension, OSA on CPAP, CKDstage III, morbid obesity as well as necrotizing fasciitis of the groin and debridement of left second toe ulcer. Recent admission 03/24/2017-04/02/2017 for severe sepsis secondary to left lower extremity cellulitis.Per chart review and patient,patient lives with her disabled mother whohasan amputation, uncle and nephew.She is the caregiver for her mother.Presented 04/07/2017 with decreasing ischemic changes of left lower extremity as well as necrotic abscess. Wound was not felt to be salvageable and underwent transtibial amputation 04/07/2017 with placement of wound VAC per Dr. Sharol Given. Hospital course pain management. MRSA PCR screening positive maintained  on contact precautions. Physical therapy evaluation completed with recommendations of physical medicine rehabilitation consult. Patient transferred to CIR on 04/11/2017 .   Patient currently requires mod with mobility secondary to muscle weakness, decreased cardiorespiratoy endurance and decreased standing balance and decreased balance strategies.  Prior to hospitalization, patient was independent  with mobility and lived with Family(mother, uncle, niece, nephew) in a House home.  Home access is 1Stairs to enter.  Patient will benefit from skilled PT intervention to maximize safe functional mobility, minimize fall risk and decrease caregiver burden for planned discharge intermittent supervision.  Anticipate patient will benefit from follow up [place] at discharge.     Skilled Therapeutic Intervention Evaluation completed (see details above and below) with education on PT POC and goals and individual treatment initiated with focus on functional mobility, education and w/c mobility. Pt seated EOB upon PT arrival, agreeable to therapy tx and reports 5/10 pain in L LE. Pt performed bed mobility supervision. Therapist performed strength and ROM testing as detail below, pt educated on LAQ exercise and quad sets to perform on her own, 2 x 10 each. Pt performed sit<>stand from bed with mod assist and  from elevated bed with min assist. Pt performed stand pivot transfer with RW and min assist. Therapist began discussing the possibility of getting a ramp at home since she has 2STE and the pt agrees that she does not think she can hop up the steps. Pt propelled w/c x150 ft using B UEs with supervision. Pt left seated in w/c at end of session with needs in reach.   PT Evaluation Precautions/Restrictions Precautions Precautions: Fall Precaution Comments: L BKA Restrictions Weight Bearing Restrictions: Yes LLE Weight Bearing: Non weight bearing Other Position/Activity Restrictions: Recent BKA on LLE; wound  vac General   Vital SignsTherapy Vitals Temp: 97.8 F (36.6 C) Temp Source: Oral Pulse Rate: 94 Resp: 18 BP: 130/71 Patient Position (if appropriate): Lying Oxygen Therapy SpO2: 100 % O2 Device: Not Delivered Pain Pain Assessment Pain Assessment: 0-10 Pain Score: 6  Pain Type: Acute pain Pain Location: Leg Pain Orientation: Left Pain Descriptors / Indicators: Throbbing Pain Frequency: Constant Pain Onset: On-going Patients Stated Pain Goal: 3 Pain Intervention(s): Medication (See eMAR) Home Living/Prior Functioning Home Living Available Help at Discharge: Family Type of Home: House Home Access: Stairs to enter Technical brewer of Steps: 1 Entrance Stairs-Rails: Right;Left Home Layout: One level  Lives With: Family(mother, uncle, niece, nephew) Prior Function Level of Independence: Independent with basic ADLs;Independent with homemaking with ambulation;Independent with gait Driving: No Vocation: On disability Vocation Requirements: caregiver for her mother Comments: ADLs, IADLs, no driving; family and medicaid transport for community mobility; enjoys shopping, painting/drawing, reading, and sewing Cognition Overall Cognitive Status: Within Functional Limits for tasks assessed Orientation Level: Oriented X4 Attention: Sustained Sustained Attention: Appears intact Memory: Appears intact Awareness: Appears intact Problem Solving: Appears intact Safety/Judgment: Appears intact Sensation Sensation Light Touch: Appears Intact Proprioception: Appears Intact Additional Comments: B LE sensation grossly intact, some diminished sensation near L incision  Coordination Gross Motor Movements are Fluid and Coordinated: No Fine Motor Movements are Fluid and Coordinated: No Coordination and Movement Description: B LE coordination impared secondary to L BKA Motor  Motor Motor - Skilled Clinical Observations: generalized weakness  Trunk/Postural Assessment  Cervical  Assessment Cervical Assessment: Within Functional Limits Thoracic Assessment Thoracic Assessment: Exceptions to WFL(kyphotic) Lumbar Assessment Lumbar Assessment: Within Functional Limits Postural Control Postural Control: Within Functional Limits  Balance Balance Balance Assessed: Yes Static Sitting Balance Static Sitting - Level of Assistance: 5: Stand by assistance Dynamic Sitting Balance Dynamic Sitting - Level of Assistance: 5: Stand by assistance Extremity Assessment  RLE Assessment RLE Assessment: Within Functional Limits LLE Strength Left Hip Flexion: 4/5 Left Hip Extension: 4-/5 Left Hip ABduction: 4/5 Left Hip ADduction: 4/5 Left Knee Flexion: 3+/5 Left Knee Extension: 3+/5   See Function Navigator for Current Functional Status.   Refer to Care Plan for Long Term Goals  Recommendations for other services: Therapeutic Recreation  Outing/community reintegration  Discharge Criteria: Patient will be discharged from PT if patient refuses treatment 3 consecutive times without medical reason, if treatment goals not met, if there is a change in medical status, if patient makes no progress towards goals or if patient is discharged from hospital.  The above assessment, treatment plan, treatment alternatives and goals were discussed and mutually agreed upon: by patient  Netta Corrigan, PT, DPT 04/12/2017, 9:08 AM

## 2017-04-12 NOTE — Care Management Note (Signed)
Inpatient Rehabilitation Center Individual Statement of Services  Patient Name:  [name] [name]  Date:  04/12/2017  Welcome to the [facility].  Our goal is to provide you with an individualized program based on your diagnosis and situation, designed to meet your specific needs.  With this comprehensive rehabilitation program, you will be expected to participate in at least 3 hours of rehabilitation therapies Monday-Friday, with modified therapy programming on the weekends.  Your rehabilitation program will include the following services:  Physical Therapy (PT), Occupational Therapy (OT), 24 hour per day rehabilitation nursing, Neuropsychology, Case Management (Social Worker), Rehabilitation Medicine, Nutrition Services and Pharmacy Services  Weekly team conferences will be held on Wednesday to discuss your progress.  Your Social Worker will talk with you frequently to get your input and to update you on team discussions.  Team conferences with you and your family in attendance may also be held.  Expected length of stay: 10-14 days  Overall anticipated outcome: mod/i level  Depending on your progress and recovery, your program may change. Your Social Worker will coordinate services and will keep you informed of any changes. Your Social Worker's name and contact numbers are listed  below.  The following services may also be recommended but are not provided by the [facility]:   [place] will be made to provide these services after discharge if needed.  Arrangements include referral to agencies that provide these services.  Your insurance has been verified to be:  Medicare & Medicaid Your primary doctor is:  Colbert Ewing  Pertinent information will be shared with your doctor and your insurance company.  Social Worker:  Ovidio Kin, [place] or ([phone]  Information discussed with and copy given to patient by: Elease Hashimoto, 04/12/2017, 2:20 PM

## 2017-04-12 NOTE — Plan of Care (Signed)
  RD consulted for nutrition education regarding low potassium and diabetes.   Lab Results  Component Value Date   HGBA1C 9.0 (H) 03/25/2017    RD provided "Carbohydrate Counting for People with Diabetes" and "Potassium contents in food" handout from the Academy of Nutrition and Dietetics. Discussed different food groups and their effects on blood sugar, emphasizing carbohydrate-containing foods. Provided list of carbohydrates and recommended serving sizes of common foods. Additionally provided list of foods high and low in potassium. Educated on the importance of following a low potassium diet. Discussed meal diet samples low in potassium.   Discussed importance of controlled and consistent carbohydrate intake throughout the day. Provided examples of ways to balance meals/snacks and encouraged intake of high-fiber, whole grain complex carbohydrates. Discussed diabetic friendly drink options.Teach back method used.  Expect good compliance.  Body mass index is 49.95 kg/m. Pt meets criteria for morbid obesity based on current BMI.  Current diet order is carbohydrate modified/low potassium, patient is consuming approximately 100% of meals at this time. Labs and medications reviewed. No further nutrition interventions warranted at this time. RD contact information provided. If additional nutrition issues arise, please re-consult RD.  Corrin Parker, MS, RD, LDN Pager # [phone] After hours/ weekend pager # [phone]

## 2017-04-12 NOTE — Progress Notes (Signed)
Social Work   Ashby Leflore, Eliezer Champagne  Social Worker  Physical Medicine and Rehabilitation  Patient Care Conference  Signed  Date of Service:  04/12/2017 12:12 PM          Signed          [] Hide copied text  [] Hover for details   Inpatient RehabilitationTeam Conference and Plan of Care Update Date: 04/12/2017   Time: 11:20 AM      Patient Name: [name] [name]      Medical Record Number: [id number]  Date of Birth: 29-Sep-1975 Sex: Female         Room/Bed: 4M07C/4M07C-01 Payor Info: Payor: MEDICARE / Plan: MEDICARE PART A AND B / Product Type: *No Product type* /     Admitting Diagnosis: LT BKA  Admit Date/Time:  04/11/2017  6:29 PM Admission Comments: No comment available    Primary Diagnosis:  <principal problem not specified> Principal Problem: <principal problem not specified>       Patient Active Problem List    Diagnosis Date Noted  . Unilateral complete BKA, left, sequela ([place])    . Poorly controlled type 2 diabetes mellitus with peripheral neuropathy ([place])    . Morbid obesity ([place])    . Status post below knee amputation, left ([place]) 04/11/2017  . Unilateral complete BKA, left, subsequent encounter ([place])    . Post-operative pain    . Bipolar affective disorder ([place])    . Type 2 diabetes mellitus with peripheral neuropathy (HCC)    . Benign essential HTN    . OSA (obstructive sleep apnea)    . Super obese    . Leukocytosis    . Acute blood loss anemia    . Below knee amputation status, left ([place]) 04/07/2017  . Cutaneous abscess of left foot    . Adjustment disorder with depressed mood 03/26/2017  . Left leg cellulitis    . Severe sepsis ([place]) 03/24/2017  . Hyponatremia 03/24/2017  . Abnormal ECG 03/24/2017  . Sepsis ([place]) 03/24/2017  . Acute bilateral low back pain 02/13/2017  . Heart failure with preserved ejection fraction ([place]) 02/09/2017  . Jerking movements of extremities 12/08/2016  . Routine adult health maintenance 12/08/2016  .  Chronic pain of right knee 09/08/2016  . Abdominal muscle pain 09/08/2016  . Vertigo 08/05/2016  . Lightheadedness 07/15/2016  . CKD (chronic kidney disease) stage 2, GFR 60-89 ml/min 07/15/2016  . Bilateral lower extremity edema 05/13/2016  . OSA on CPAP 05/13/2016  . Vaginal pruritus 03/14/2016  . Morbid obesity due to excess calories ([place]) 11/27/2015  . Callus of foot 11/26/2015  . History of Low serum cortisol level ([place]) 01/07/2015  . Financial difficulty 04/25/2013  . Uncontrolled type 2 diabetes mellitus ([place]) 07/12/2012  . Necrotizing fasciitis s/p OR debridements 07/06/2012  . Carpal tunnel syndrome, bilateral 01/25/2012  . Diabetic peripheral neuropathy ([place]) 07/04/2011  . Anxiety and depression 06/02/2011  . History of abnormal cervical Pap smear 12/13/2007  . GERD 12/06/2007  . Hyperlipidemia 08/29/2007  . Essential hypertension 08/29/2007      Expected Discharge Date:     Team Members Present: Physician leading conference: Dr. Delice Lesch Social Worker Present: Ovidio Kin, LCSW Nurse Present: Junius Creamer, RN PT Present: Michaelene Song, PT OT Present: Clyda Greener, OT SLP Present: Weston Anna, SLP PPS Coordinator present : Daiva Nakayama, RN, CRRN       Current Status/Progress Goal Weekly Team Focus  Medical     Decreased functional mobility secondary to left  transtibial amputation 04/07/2017  Improve mobility, HTN/DM, anemia  See above   Bowel/Bladder     continent of bowel and bladder LBM 11-20  remain continent of bowel and bladder, maintain regular bowel patter  Assist with toileting needs   Swallow/Nutrition/ Hydration               ADL's       eval pending        Mobility     min to mod assist for sit<>stands, supervision bed mobility, supervision w/c propulsion  supervision overall  gait, standing balance, transfers, w/c propulsion, d/c planning   Communication               Safety/Cognition/ Behavioral Observations             Pain      patient has oxycodone patient states that pain isn't  pain <6  Assess pain q shift and prn and treat as needed   Skin     MASD to peri area, right heel dark area, dry skin  no new skin breakdown  Assess skin q shift and prn      *See Care Plan and progress notes for long and short-term goals.      Barriers to Discharge   Current Status/Progress Possible Resolutions Date Resolved   Physician     Medical stability;Decreased caregiver support;Wound Care;Lack of/limited family support;Other (comments)  Morbid obesity  See above  Therapies, optimize DM/HTN, follow labs, obesity, may need transufsion      Nursing                 PT  Decreased caregiver support;Inaccessible home environment  2 STE               OT                 SLP            SW              Discharge Planning/Teaching Needs:    Home with disabled mom who she was the caregiver for. Will need to be mod/i before going home     Team Discussion:  Goals supervision level, being evaluated today in therapies. Wound Vac on amputated leg. MD watching hemoglobin   Revisions to Treatment Plan:  New eval    Continued Need for Acute Rehabilitation Level of Care: The patient requires daily medical management by a physician with specialized training in physical medicine and rehabilitation for the following conditions: Daily direction of a multidisciplinary physical rehabilitation program to ensure safe treatment while eliciting the highest outcome that is of practical value to the patient.: Yes Daily medical management of patient stability for increased activity during participation in an intensive rehabilitation regime.: Yes Daily analysis of laboratory values and/or radiology reports with any subsequent need for medication adjustment of medical intervention for : Post surgical problems;Diabetes problems;Blood pressure problems;Renal problems;Other   Riggin Cuttino, Gardiner Rhyme 04/12/2017, 12:12 PM                  Charizma Gardiner, Gardiner Rhyme, LCSW  Social Worker  Physical Medicine and Rehabilitation  Patient Care Conference  Signed  Date of Service:  04/12/2017 12:12 PM          Signed          [] Hide copied text  [] Hover for details   Inpatient RehabilitationTeam Conference and Plan of Care Update Date: 04/12/2017   Time: 11:20 AM  Patient Name: [name] [name]      Medical Record Number: [id number]  Date of Birth: 1975/09/14 Sex: Female         Room/Bed: 4M07C/4M07C-01 Payor Info: Payor: MEDICARE / Plan: MEDICARE PART A AND B / Product Type: *No Product type* /     Admitting Diagnosis: LT BKA  Admit Date/Time:  04/11/2017  6:29 PM Admission Comments: No comment available    Primary Diagnosis:  <principal problem not specified> Principal Problem: <principal problem not specified>       Patient Active Problem List    Diagnosis Date Noted  . Unilateral complete BKA, left, sequela ([place])    . Poorly controlled type 2 diabetes mellitus with peripheral neuropathy ([place])    . Morbid obesity ([place])    . Status post below knee amputation, left ([place]) 04/11/2017  . Unilateral complete BKA, left, subsequent encounter ([place])    . Post-operative pain    . Bipolar affective disorder ([place])    . Type 2 diabetes mellitus with peripheral neuropathy (HCC)    . Benign essential HTN    . OSA (obstructive sleep apnea)    . Super obese    . Leukocytosis    . Acute blood loss anemia    . Below knee amputation status, left ([place]) 04/07/2017  . Cutaneous abscess of left foot    . Adjustment disorder with depressed mood 03/26/2017  . Left leg cellulitis    . Severe sepsis ([place]) 03/24/2017  . Hyponatremia 03/24/2017  . Abnormal ECG 03/24/2017  . Sepsis ([place]) 03/24/2017  . Acute bilateral low back pain 02/13/2017  . Heart failure with preserved ejection fraction ([place]) 02/09/2017  . Jerking movements of extremities 12/08/2016  . Routine adult health maintenance 12/08/2016  . Chronic pain of right knee 09/08/2016   . Abdominal muscle pain 09/08/2016  . Vertigo 08/05/2016  . Lightheadedness 07/15/2016  . CKD (chronic kidney disease) stage 2, GFR 60-89 ml/min 07/15/2016  . Bilateral lower extremity edema 05/13/2016  . OSA on CPAP 05/13/2016  . Vaginal pruritus 03/14/2016  . Morbid obesity due to excess calories ([place]) 11/27/2015  . Callus of foot 11/26/2015  . History of Low serum cortisol level ([place]) 01/07/2015  . Financial difficulty 04/25/2013  . Uncontrolled type 2 diabetes mellitus ([place]) 07/12/2012  . Necrotizing fasciitis s/p OR debridements 07/06/2012  . Carpal tunnel syndrome, bilateral 01/25/2012  . Diabetic peripheral neuropathy ([place]) 07/04/2011  . Anxiety and depression 06/02/2011  . History of abnormal cervical Pap smear 12/13/2007  . GERD 12/06/2007  . Hyperlipidemia 08/29/2007  . Essential hypertension 08/29/2007      Expected Discharge Date:     Team Members Present: Physician leading conference: Dr. Delice Lesch Social Worker Present: Ovidio Kin, LCSW Nurse Present: Junius Creamer, RN PT Present: Michaelene Song, PT OT Present: Clyda Greener, OT SLP Present: Weston Anna, SLP PPS Coordinator present : Daiva Nakayama, RN, CRRN       Current Status/Progress Goal Weekly Team Focus  Medical     Decreased functional mobility secondary to left transtibial amputation 04/07/2017  Improve mobility, HTN/DM, anemia  See above   Bowel/Bladder     continent of bowel and bladder LBM 11-20  remain continent of bowel and bladder, maintain regular bowel patter  Assist with toileting needs   Swallow/Nutrition/ Hydration               ADL's       eval pending        Mobility  min to mod assist for sit<>stands, supervision bed mobility, supervision w/c propulsion  supervision overall  gait, standing balance, transfers, w/c propulsion, d/c planning   Communication               Safety/Cognition/ Behavioral Observations             Pain     patient has oxycodone patient states  that pain isn't  pain <6  Assess pain q shift and prn and treat as needed   Skin     MASD to peri area, right heel dark area, dry skin  no new skin breakdown  Assess skin q shift and prn      *See Care Plan and progress notes for long and short-term goals.      Barriers to Discharge   Current Status/Progress Possible Resolutions Date Resolved   Physician     Medical stability;Decreased caregiver support;Wound Care;Lack of/limited family support;Other (comments)  Morbid obesity  See above  Therapies, optimize DM/HTN, follow labs, obesity, may need transufsion      Nursing                 PT  Decreased caregiver support;Inaccessible home environment  2 STE               OT                 SLP            SW              Discharge Planning/Teaching Needs:    Home with disabled mom who she was the caregiver for. Will need to be mod/i before going home     Team Discussion:  Goals supervision level, being evaluated today in therapies. Wound Vac on amputated leg. MD watching hemoglobin   Revisions to Treatment Plan:  New eval    Continued Need for Acute Rehabilitation Level of Care: The patient requires daily medical management by a physician with specialized training in physical medicine and rehabilitation for the following conditions: Daily direction of a multidisciplinary physical rehabilitation program to ensure safe treatment while eliciting the highest outcome that is of practical value to the patient.: Yes Daily medical management of patient stability for increased activity during participation in an intensive rehabilitation regime.: Yes Daily analysis of laboratory values and/or radiology reports with any subsequent need for medication adjustment of medical intervention for : Post surgical problems;Diabetes problems;Blood pressure problems;Renal problems;Other   Sahiti Joswick, Gardiner Rhyme 04/12/2017, 12:12 PM                  Righteous Claiborne, Gardiner Rhyme, LCSW  Social Worker  Physical  Medicine and Rehabilitation  Patient Care Conference  Signed  Date of Service:  04/12/2017 12:12 PM          Signed          [] Hide copied text  [] Hover for details   Inpatient RehabilitationTeam Conference and Plan of Care Update Date: 04/12/2017   Time: 11:20 AM      Patient Name: [name] [name]      Medical Record Number: [id number]  Date of Birth: [birth year] Sex: Female         Room/Bed: 4M07C/4M07C-01 Payor Info: Payor: MEDICARE / Plan: MEDICARE PART A AND B / Product Type: *No Product type* /     Admitting Diagnosis: LT BKA  Admit Date/Time:  04/11/2017  6:29 PM Admission Comments: No comment available    Primary Diagnosis:  <  principal problem not specified> Principal Problem: <principal problem not specified>       Patient Active Problem List    Diagnosis Date Noted  . Unilateral complete BKA, left, sequela ([place])    . Poorly controlled type 2 diabetes mellitus with peripheral neuropathy ([place])    . Morbid obesity ([place])    . Status post below knee amputation, left ([place]) 04/11/2017  . Unilateral complete BKA, left, subsequent encounter ([place])    . Post-operative pain    . Bipolar affective disorder ([place])    . Type 2 diabetes mellitus with peripheral neuropathy (HCC)    . Benign essential HTN    . OSA (obstructive sleep apnea)    . Super obese    . Leukocytosis    . Acute blood loss anemia    . Below knee amputation status, left ([place]) 04/07/2017  . Cutaneous abscess of left foot    . Adjustment disorder with depressed mood 03/26/2017  . Left leg cellulitis    . Severe sepsis ([place]) 03/24/2017  . Hyponatremia 03/24/2017  . Abnormal ECG 03/24/2017  . Sepsis ([place]) 03/24/2017  . Acute bilateral low back pain 02/13/2017  . Heart failure with preserved ejection fraction ([place]) 02/09/2017  . Jerking movements of extremities 12/08/2016  . Routine adult health maintenance 12/08/2016  . Chronic pain of right knee 09/08/2016  . Abdominal muscle pain  09/08/2016  . Vertigo 08/05/2016  . Lightheadedness 07/15/2016  . CKD (chronic kidney disease) stage 2, GFR 60-89 ml/min 07/15/2016  . Bilateral lower extremity edema 05/13/2016  . OSA on CPAP 05/13/2016  . Vaginal pruritus 03/14/2016  . Morbid obesity due to excess calories ([place]) 11/27/2015  . Callus of foot 11/26/2015  . History of Low serum cortisol level ([place]) 01/07/2015  . Financial difficulty 04/25/2013  . Uncontrolled type 2 diabetes mellitus ([place]) 07/12/2012  . Necrotizing fasciitis s/p OR debridements 07/06/2012  . Carpal tunnel syndrome, bilateral 01/25/2012  . Diabetic peripheral neuropathy ([place]) 07/04/2011  . Anxiety and depression 06/02/2011  . History of abnormal cervical Pap smear 12/13/2007  . GERD 12/06/2007  . Hyperlipidemia 08/29/2007  . Essential hypertension 08/29/2007      Expected Discharge Date:     Team Members Present: Physician leading conference: Dr. Delice Lesch Social Worker Present: Ovidio Kin, LCSW Nurse Present: Junius Creamer, RN PT Present: Michaelene Song, PT OT Present: Clyda Greener, OT SLP Present: Weston Anna, SLP PPS Coordinator present : Daiva Nakayama, RN, CRRN       Current Status/Progress Goal Weekly Team Focus  Medical     Decreased functional mobility secondary to left transtibial amputation 04/07/2017  Improve mobility, HTN/DM, anemia  See above   Bowel/Bladder     continent of bowel and bladder LBM 11-20  remain continent of bowel and bladder, maintain regular bowel patter  Assist with toileting needs   Swallow/Nutrition/ Hydration               ADL's       eval pending        Mobility     min to mod assist for sit<>stands, supervision bed mobility, supervision w/c propulsion  supervision overall  gait, standing balance, transfers, w/c propulsion, d/c planning   Communication               Safety/Cognition/ Behavioral Observations             Pain     patient has oxycodone patient states that pain isn't  pain <6  Assess pain q shift and prn and treat as needed   Skin     MASD to peri area, right heel dark area, dry skin  no new skin breakdown  Assess skin q shift and prn      *See Care Plan and progress notes for long and short-term goals.      Barriers to Discharge   Current Status/Progress Possible Resolutions Date Resolved   Physician     Medical stability;Decreased caregiver support;Wound Care;Lack of/limited family support;Other (comments)  Morbid obesity  See above  Therapies, optimize DM/HTN, follow labs, obesity, may need transufsion      Nursing                 PT  Decreased caregiver support;Inaccessible home environment  2 STE               OT                 SLP            SW              Discharge Planning/Teaching Needs:    Home with disabled mom who she was the caregiver for. Will need to be mod/i before going home     Team Discussion:  Goals supervision level, being evaluated today in therapies. Wound Vac on amputated leg. MD watching hemoglobin   Revisions to Treatment Plan:  New eval    Continued Need for Acute Rehabilitation Level of Care: The patient requires daily medical management by a physician with specialized training in physical medicine and rehabilitation for the following conditions: Daily direction of a multidisciplinary physical rehabilitation program to ensure safe treatment while eliciting the highest outcome that is of practical value to the patient.: Yes Daily medical management of patient stability for increased activity during participation in an intensive rehabilitation regime.: Yes Daily analysis of laboratory values and/or radiology reports with any subsequent need for medication adjustment of medical intervention for : Post surgical problems;Diabetes problems;Blood pressure problems;Renal problems;Other   Theodoro Koval, Gardiner Rhyme 04/12/2017, 12:12 PM                  Floetta Brickey, Gardiner Rhyme, LCSW  Social Worker  Physical Medicine and Rehabilitation    Patient Care Conference  Signed  Date of Service:  04/12/2017 12:12 PM          Signed          [] Hide copied text  [] Hover for details   Inpatient RehabilitationTeam Conference and Plan of Care Update Date: 04/12/2017   Time: 11:20 AM      Patient Name: [name] [name]      Medical Record Number: [id number]  Date of Birth: 10/19/1975 Sex: Female         Room/Bed: 4M07C/4M07C-01 Payor Info: Payor: MEDICARE / Plan: MEDICARE PART A AND B / Product Type: *No Product type* /     Admitting Diagnosis: LT BKA  Admit Date/Time:  04/11/2017  6:29 PM Admission Comments: No comment available    Primary Diagnosis:  <principal problem not specified> Principal Problem: <principal problem not specified>       Patient Active Problem List    Diagnosis Date Noted  . Unilateral complete BKA, left, sequela ([place])    . Poorly controlled type 2 diabetes mellitus with peripheral neuropathy ([place])    . Morbid obesity ([place])    . Status post below knee amputation, left ([place]) 04/11/2017  . Unilateral complete BKA, left,  subsequent encounter ([place])    . Post-operative pain    . Bipolar affective disorder ([place])    . Type 2 diabetes mellitus with peripheral neuropathy (HCC)    . Benign essential HTN    . OSA (obstructive sleep apnea)    . Super obese    . Leukocytosis    . Acute blood loss anemia    . Below knee amputation status, left ([place]) 04/07/2017  . Cutaneous abscess of left foot    . Adjustment disorder with depressed mood 03/26/2017  . Left leg cellulitis    . Severe sepsis ([place]) 03/24/2017  . Hyponatremia 03/24/2017  . Abnormal ECG 03/24/2017  . Sepsis ([place]) 03/24/2017  . Acute bilateral low back pain 02/13/2017  . Heart failure with preserved ejection fraction ([place]) 02/09/2017  . Jerking movements of extremities 12/08/2016  . Routine adult health maintenance 12/08/2016  . Chronic pain of right knee 09/08/2016  . Abdominal muscle pain 09/08/2016  . Vertigo 08/05/2016   . Lightheadedness 07/15/2016  . CKD (chronic kidney disease) stage 2, GFR 60-89 ml/min 07/15/2016  . Bilateral lower extremity edema 05/13/2016  . OSA on CPAP 05/13/2016  . Vaginal pruritus 03/14/2016  . Morbid obesity due to excess calories ([place]) 11/27/2015  . Callus of foot 11/26/2015  . History of Low serum cortisol level ([place]) 01/07/2015  . Financial difficulty 04/25/2013  . Uncontrolled type 2 diabetes mellitus ([place]) 07/12/2012  . Necrotizing fasciitis s/p OR debridements 07/06/2012  . Carpal tunnel syndrome, bilateral 01/25/2012  . Diabetic peripheral neuropathy ([place]) 07/04/2011  . Anxiety and depression 06/02/2011  . History of abnormal cervical Pap smear 12/13/2007  . GERD 12/06/2007  . Hyperlipidemia 08/29/2007  . Essential hypertension 08/29/2007      Expected Discharge Date:     Team Members Present: Physician leading conference: Dr. Delice Lesch Social Worker Present: Ovidio Kin, LCSW Nurse Present: Junius Creamer, RN PT Present: Michaelene Song, PT OT Present: Clyda Greener, OT SLP Present: Weston Anna, SLP PPS Coordinator present : Daiva Nakayama, RN, CRRN       Current Status/Progress Goal Weekly Team Focus  Medical     Decreased functional mobility secondary to left transtibial amputation 04/07/2017  Improve mobility, HTN/DM, anemia  See above   Bowel/Bladder     continent of bowel and bladder LBM 11-20  remain continent of bowel and bladder, maintain regular bowel patter  Assist with toileting needs   Swallow/Nutrition/ Hydration               ADL's       eval pending        Mobility     min to mod assist for sit<>stands, supervision bed mobility, supervision w/c propulsion  supervision overall  gait, standing balance, transfers, w/c propulsion, d/c planning   Communication               Safety/Cognition/ Behavioral Observations             Pain     patient has oxycodone patient states that pain isn't  pain <6  Assess pain q shift and prn and  treat as needed   Skin     MASD to peri area, right heel dark area, dry skin  no new skin breakdown  Assess skin q shift and prn      *See Care Plan and progress notes for long and short-term goals.      Barriers to Discharge   Current Status/Progress Possible Resolutions Date Resolved  Physician     Medical stability;Decreased caregiver support;Wound Care;Lack of/limited family support;Other (comments)  Morbid obesity  See above  Therapies, optimize DM/HTN, follow labs, obesity, may need transufsion      Nursing                 PT  Decreased caregiver support;Inaccessible home environment  2 STE               OT                 SLP            SW              Discharge Planning/Teaching Needs:    Home with disabled mom who she was the caregiver for. Will need to be mod/i before going home     Team Discussion:  Goals supervision level, being evaluated today in therapies. Wound Vac on amputated leg. MD watching hemoglobin   Revisions to Treatment Plan:  New eval    Continued Need for Acute Rehabilitation Level of Care: The patient requires daily medical management by a physician with specialized training in physical medicine and rehabilitation for the following conditions: Daily direction of a multidisciplinary physical rehabilitation program to ensure safe treatment while eliciting the highest outcome that is of practical value to the patient.: Yes Daily medical management of patient stability for increased activity during participation in an intensive rehabilitation regime.: Yes Daily analysis of laboratory values and/or radiology reports with any subsequent need for medication adjustment of medical intervention for : Post surgical problems;Diabetes problems;Blood pressure problems;Renal problems;Other   Elease Hashimoto 04/12/2017, 12:12 PM                 Patient ID: [name], female   DOB: 1975-12-11, 41 y.o.   MRN: [id number]

## 2017-04-12 NOTE — Progress Notes (Signed)
PMR Admission Coordinator Pre-Admission Assessment  Patient: [name] [name] is an 41 y.o., female MRN: [id number] DOB: 15-Dec-1975 Height: 5\' 10"  (177.8 cm) Weight: (!) 159.6 kg (351 lb 13.7 oz)                                                                                                                                                  Insurance Information HMO:     PPO:      PCP:      IPA:      80/20:      OTHER:  PRIMARY: Medicare A & B      Policy#: [id number]      Subscriber: Self CM Name:       Phone#:      Fax#:  Pre-Cert#: Eligible       Employer:  Benefits:  Phone #: Verified online     Name: Passport One Eff. Date: 09/20/13     Deduct: $1340      Out of Pocket Max: N/A      Life Max: N/A CIR: 100%      SNF: 100% days 1-20, 80% days 21-100 Outpatient: 80%      Co-Pay: 20% Home Health: 100%      Co-Pay: none DME: 80%     Co-Pay: 20% Providers: Patient's choice   SECONDARY: Medicaid of [place]      Policy#: [id number]      Subscriber: Self CM Name:       Phone#:      Fax#:  Pre-Cert#: Coverage code:[id number]      Employer:  Benefits:  Phone #: [phone]     Name: Automated system  Eff. Date: Eligible 04/10/17     Deduct:       Out of Pocket Max:       Life Max:  CIR:       SNF:  Outpatient:      Co-Pay:  Home Health:       Co-Pay:  DME:      Co-Pay:   Medicaid Application Date:       Case Manager:  Disability Application Date:       Case Worker:   Emergency Contact Information        Contact Information    Name Relation Home Work [place] Mother [phone]  [phone]     Current Medical History  Patient Admitting Diagnosis: Left BKA  History of Present Illness: [name] C Clyburnis a 41 y.o.right handed femalewith history of bipolar disorder, diabetes mellitus peripheral neuropathy, hypertension, OSA on CPAP, CKDstage III, morbid obesity as well as necrotizing fasciitis of the groin and debridement of left second toe ulcer. Recent  admission 03/24/2017-04/02/2017 for severe sepsis secondary to left lower extremity cellulitis.Per chart review and patient,patient lives with her disabled mother whohasan amputation, uncle and nephew.She is the  caregiver for her mother.Presented 04/07/2017 with decreasing ischemic changes of left lower extremity as well as necrotic abscess. Wound was not felt to be salvageable and underwent transtibial amputation 04/07/2017 with placement of wound VAC per Dr. Sharol Given. Hospital course pain management.MRSA PCR screening positive maintained on contact precautions.Physical therapy evaluation completed with recommendations of physical medicine rehabilitation consult.Patient was admitted for a comprehensive rehabilitation program 04/11/17.     Past Medical History      Past Medical History:  Diagnosis Date  . Abnormal Pap smear of cervix 2009  . Abscess    history of multiple abscesses  . Anemia of chronic disease 2002  . Anxiety   . Bipolar disorder ([place])   . Cellulitis 05/21/2014   right eye  . Chronic bronchitis ([place])    "get it q yr" (05/13/2013)  . Chronic pain   . Depression   . Edema of lower extremity   . Endocarditis 2002   subacute bacterial endocarditis.   . Family history of anesthesia complication    "my mom has a hard time coming out from under"  . Fibromyalgia   . GERD (gastroesophageal reflux disease)   . Heart murmur   . History of blood transfusion    "just low blood count" (05/13/2013)  . Hyperlipidemia   . Hypertension   . Hypothyroidism   . Hypothyroidism, adult 03/21/2014  . Necrosis ([place])    and ulceration  . Obesity   . OSA on CPAP    does not wear CPAP  . Peripheral neuropathy   . Type II diabetes mellitus (H[facility])     Family History  family history includes Diabetes in her father, mother, and paternal grandmother; Heart failure in her mother and paternal grandmother; Kidney disease in her father and mother; Other in  her other.  Prior Rehab/Hospitalizations:  Has the patient had major surgery during 100 days prior to admission? No  Current Medications   Current Facility-Administered Medications:  .  acetaminophen (TYLENOL) tablet 650 mg, 650 mg, Oral, Q4H PRN **OR** acetaminophen (TYLENOL) suppository 650 mg, 650 mg, Rectal, Q4H PRN, Newt Minion, MD .  amitriptyline (ELAVIL) tablet 50 mg, 50 mg, Oral, QHS, Newt Minion, MD, 50 mg at 04/10/17 2312 .  aspirin EC tablet 325 mg, 325 mg, Oral, Daily, Newt Minion, MD, 325 mg at 04/10/17 0944 .  atorvastatin (LIPITOR) tablet 20 mg, 20 mg, Oral, q1800, Newt Minion, MD, 20 mg at 04/10/17 1808 .  bisacodyl (DULCOLAX) suppository 10 mg, 10 mg, Rectal, Daily PRN, Newt Minion, MD .  docusate sodium (COLACE) capsule 100 mg, 100 mg, Oral, BID, Newt Minion, MD, 100 mg at 04/10/17 2312 .  furosemide (LASIX) tablet 40 mg, 40 mg, Oral, Daily, Newt Minion, MD, 40 mg at 04/10/17 0944 .  insulin aspart (novoLOG) injection 0-20 Units, 0-20 Units, Subcutaneous, TID WC, Newt Minion, MD, 7 Units at 04/11/17 0827 .  insulin aspart (novoLOG) injection 6 Units, 6 Units, Subcutaneous, TID WC, Newt Minion, MD, 6 Units at 04/11/17 [id number] .  insulin glargine (LANTUS) injection 30 Units, 30 Units, Subcutaneous, QHS, Newt Minion, MD, 30 Units at 04/11/17 0011 .  magnesium citrate solution 1 Bottle, 1 Bottle, Oral, Once PRN, Newt Minion, MD .  methocarbamol (ROBAXIN) tablet 500 mg, 500 mg, Oral, Q6H PRN, 500 mg at 04/11/17 0833 **OR** methocarbamol (ROBAXIN) 500 mg in dextrose 5 % 50 mL IVPB, 500 mg, Intravenous, Q6H PRN, Newt Minion, MD .  metoCLOPramide (REGLAN) tablet 5-10 mg, 5-10 mg, Oral, Q8H PRN **OR** metoCLOPramide (REGLAN) injection 5-10 mg, 5-10 mg, Intravenous, Q8H PRN, Newt Minion, MD .  mupirocin ointment (BACTROBAN) 2 % 1 application, 1 application, Nasal, BID, Newt Minion, MD, 1 application at 02/72/53 2312 .  ondansetron (ZOFRAN)  tablet 4 mg, 4 mg, Oral, Q6H PRN **OR** ondansetron (ZOFRAN) injection 4 mg, 4 mg, Intravenous, Q6H PRN, Newt Minion, MD .  oxyCODONE (Oxy IR/ROXICODONE) immediate release tablet 10 mg, 10 mg, Oral, Q3H PRN, Newt Minion, MD, 10 mg at 04/11/17 6644 .  oxyCODONE (Oxy IR/ROXICODONE) immediate release tablet 5 mg, 5 mg, Oral, Q3H PRN, Newt Minion, MD, 5 mg at 04/08/17 1343 .  polyethylene glycol (MIRALAX / GLYCOLAX) packet 17 g, 17 g, Oral, Daily PRN, Newt Minion, MD .  pregabalin (LYRICA) capsule 100 mg, 100 mg, Oral, BID, Newt Minion, MD, 100 mg at 04/10/17 2312  Patients Current Diet: Diet Carb Modified Fluid consistency: Thin; Room service appropriate? Yes Diet - low sodium heart healthy  Precautions / Restrictions Precautions Precautions: Fall Precaution Comments: left foot wound Restrictions Weight Bearing Restrictions: Yes LLE Weight Bearing: Non weight bearing Other Position/Activity Restrictions: Recent BKA on LLE; wound vac   Has the patient had 2 or more falls or a fall with injury in the past year?Yes, due to vertigo/dizziness spells  Prior Activity Level Community (5-7x/wk): Prior to admission patient was fully independent and caring for mom at home.  She did the home management, cooking, and cleaning.  She doesn't drive and has family support for transportation.  Same family support is currently caring for her mom and will assist with her transition home as well.    Home Assistive Devices / Equipment Home Assistive Devices/Equipment: CBG Meter Home Equipment: Walker - 2 wheels  Prior Device Use: Indicate devices/aids used by the patient prior to current illness, exacerbation or injury? None of the above  Prior Functional Level Prior Function Level of Independence: Independent Comments: ADLs, IADLs, no driving; family and medicaid transport for community mobility; enjoys shopping, painting/drawing, reading, and sewing  Self Care: Did the patient need  help bathing, dressing, using the toilet or eating? Independent  Indoor Mobility: Did the patient need assistance with walking from room to room (with or without device)? Independent  Stairs: Did the patient need assistance with internal or external stairs (with or without device)? Independent  Functional Cognition: Did the patient need help planning regular tasks such as shopping or remembering to take medications? Independent  Current Functional Level Cognition  Overall Cognitive Status: Within Functional Limits for tasks assessed Orientation Level: Oriented X4    Extremity Assessment (includes Sensation/Coordination)  Upper Extremity Assessment: Overall WFL for tasks assessed  Lower Extremity Assessment: Defer to PT evaluation LLE Deficits / Details: Recent BKA LLE Sensation: (Phantom pain/sensation of lower leg)    ADLs  Overall ADL's : Needs assistance/impaired Eating/Feeding: Set up, Sitting Grooming: Set up, Sitting Upper Body Bathing: Set up, Sitting Upper Body Bathing Details (indicate cue type and reason): Pt having sponge bathed right before OT arrival. Lower Body Bathing: Min guard, Sitting/lateral leans Lower Body Bathing Details (indicate cue type and reason): Pt having sponge bathed right before OT arrival. Pt describing laterally leaning for washing buttocks and leaning back for peri care. Upper Body Dressing : Set up, Sitting Lower Body Dressing: Moderate assistance, Sit to/from stand Lower Body Dressing Details (indicate cue type and reason): Mod A for standing balance. Pt able  to don/dof R sock while sitting in recliner Functional mobility during ADLs: Moderate assistance, Rolling walker(Sit<>stand; two hops backward toward recliner) General ADL Comments: Pt highly motivated to return to independence. Educated pt on sensory stimulation technique post amputation; pt verbalizing understanding. Pt performing sponge bath     Mobility  Overal bed mobility:  Needs Assistance Bed Mobility: Supine to Sit Supine to sit: Min guard, HOB elevated(HOB elevated 90 degrees) Sit to supine: Mod assist General bed mobility comments: Pt in recliner upon arrival    Transfers  Overall transfer level: Needs assistance Equipment used: Rolling walker (2 wheeled) Transfers: Sit to/from Stand Sit to Stand: Mod assist Stand pivot transfers: Min assist, +2 safety/equipment General transfer comment: Mod A for power into standing. Min A for safe descent. Pt verbalizing sequencing during transfer and demonstrating good technique    Ambulation / Gait / Stairs / Wheelchair Mobility  Ambulation/Gait Ambulation/Gait assistance: Min assist, +2 safety/equipment Ambulation Distance (Feet): (4 hops + 6 hops) Assistive device: Rolling walker (2 wheeled) Gait Pattern/deviations: Step-to pattern General Gait Details: fatigues quickly.  difficulty clearing floor with Rt foot but able to take small hops forwards.   Gait velocity: Decreased Gait velocity interpretation: Below normal speed for age/gender    Posture / Balance Balance Overall balance assessment: Needs assistance Sitting-balance support: Feet supported, No upper extremity supported Sitting balance-Leahy Scale: Fair Standing balance support: Bilateral upper extremity supported, During functional activity Standing balance-Leahy Scale: Poor Standing balance comment: Reliant on UE support    Special needs/care consideration BiPAP/CPAP: Yes, CPAP but reports difficulty tolerating at night and doesn't wear at home CPM: No Continuous Drip IV: No Dialysis: No Life Vest: No Oxygen: No Special Bed: No Trach Size: No Wound Vac (area): Yes      Location: Lt BKA Skin: Dry                              Location: Surgical incision to Lt BKA Bowel mgmt: Continent, last BM 04/10/17 Bladder mgmt: Incontinent at times with external catheter used at times  Diabetic mgmt: Yes, PTA not checking CBGs, but taking oral  meds and insulin      Previous Home Environment Living Arrangements: Parent(Mom) Available Help at Discharge: Family Type of Home: House Home Layout: One level Home Access: Stairs to enter Entrance Stairs-Rails: Right, Left Entrance Stairs-Number of Steps: 2 Bathroom Shower/Tub: Tub/shower unit, Architectural technologist: Standard Home Care Services: No  Discharge Living Setting Plans for Discharge Living Setting: Lives with (comment)(mom and is her caregiver ) Type of Home at Discharge: House Discharge Home Layout: One level Discharge Home Access: Stairs to enter Entrance Stairs-Rails: Can reach both Entrance Stairs-Number of Steps: 2 Discharge Bathroom Shower/Tub: Tub/shower unit, Curtain Discharge Bathroom Toilet: Handicapped height Discharge Bathroom Accessibility: Yes How Accessible: Accessible via walker Does the patient have any problems obtaining your medications?: No  Social/Family/Support Systems Patient Roles: Caregiver, Other (Comment)(Daughter, granddaughter, etc. ) Contact Information: Mom: Santiago Glad Calvillo  Anticipated Caregiver: Family members Anticipated Ambulance person Information: Uncle and nephew with patient to coordinate as needed Ability/Limitations of Caregiver: Wants limited assist in the bathroom due to it being uncle and nephew  Caregiver Availability: 24/7 Discharge Plan Discussed with Primary Caregiver: (Discussed with patient ) Is Caregiver In Agreement with Plan?: (Yes, per patient report. Heard her on phone with family ) Does Caregiver/Family have Issues with Lodging/Transportation while Pt is in Rehab?: No  Goals/Additional Needs Patient/Family Goal for Rehab:  PT/OT: Supervision-Min A Expected length of stay: 14-18 days  Cultural Considerations: None Dietary Needs: Carb. Mod. diet restrictions  Equipment Needs: TBD Additional Information: Pt reports a h/o vertigo and dizziness spells intermittently PTA Pt/Family Agrees to Admission  and willing to participate: Yes Program Orientation Provided & Reviewed with Pt/Caregiver Including Roles  & Responsibilities: Yes Additional Information Needs: Mom is blind per patient report  Information Needs to be Provided By: Team FYI  Barriers to Discharge: Lack of/limited family support(Limited support for bathroom/self-care )  Decrease burden of Care through IP rehab admission: No  Possible need for SNF placement upon discharge: No  Patient Condition: This patient's condition remains as documented in the consult dated 04/10/17, in which the Rehabilitation Physician determined and documented that the patient's condition is appropriate for intensive rehabilitative care in an inpatient rehabilitation facility. Will admit to inpatient rehab today.  Preadmission Screen Completed By:  Gunnar Fusi, 04/11/2017 8:39 AM ______________________________________________________________________   Discussed status with Dr. Naaman Plummer on 04/01/17 at 7 and received telephone approval for admission today.  Admission Coordinator:  Gunnar Fusi, time 1000/Date 04/11/17             Cosigned by: Meredith Staggers, MD at 04/11/2017 10:26 AM  Revision History

## 2017-04-12 NOTE — Progress Notes (Signed)
Patient information reviewed and entered into eRehab system by Shamell Hittle, RN, CRRN, PPS Coordinator.  Information including medical coding and functional independence measure will be reviewed and updated through discharge.     Per nursing patient was given "Data Collection Information Summary for Patients in Inpatient Rehabilitation Facilities with attached "Privacy Act Statement-Health Care Records" upon admission.  

## 2017-04-12 NOTE — Progress Notes (Signed)
Patient not ready for IV start. Out of bed. RN to place consult when ready.

## 2017-04-12 NOTE — Progress Notes (Addendum)
Physical Therapy Session Note  Patient Details  Name: [name] [name] MRN: [id number] Date of Birth: 09/24/1975  Today's Date: 04/12/2017 PT Individual Time: 1140-1200 PT Individual Time Calculation (min): 20 min  PT Missed Time (min): 10 min  Short Term Goals: Week 1:  PT Short Term Goal 1 (Week 1): Pt will perform transfers with supervision PT Short Term Goal 2 (Week 1): Pt will initiate gait training PT Short Term Goal 3 (Week 1): Pt will perform sit<>stand with supervision   Skilled Therapeutic Interventions/Progress Updates:   Missed 10 minutes of skilled PT at beginning of session 2/2 nursing care. Pt in w/c upon arrival and agreeable to therapy, no c/o pain. RN and PA-C present and requesting to perform in-room therapies only until blood work is done. PA-C also requesting elevating leg rests and RLE limb wrapping for edema. Assisted pt w/ set-up assist at sink for brushing teeth, washing face, and brushing hair. Verbal cues for technique as pt was not used to performing activities at w/c level. Transferred to EOB via stand pivot transfer using RW and transferred to supine w/ Min guard overall. Provided w/ elevating leg rests and ace-wraps for RLE wrapping. OT w/ plan to wrap after evaluation early this afternoon. In care of RN and PA-C at end of session, performing nursing care, all needs met.   Therapy Documentation Precautions:  Precautions Precautions: Fall Precaution Comments: L BKA Restrictions Weight Bearing Restrictions: Yes LLE Weight Bearing: Non weight bearing Other Position/Activity Restrictions: Recent BKA on LLE; wound vac  See Function Navigator for Current Functional Status.   Therapy/Group: Individual Therapy  Ashritha Desrosiers K Arnette 04/12/2017, 12:34 PM

## 2017-04-13 DIAGNOSIS — D62 Acute posthemorrhagic anemia: Secondary | ICD-10-CM

## 2017-04-13 DIAGNOSIS — R609 Edema, unspecified: Secondary | ICD-10-CM

## 2017-04-13 DIAGNOSIS — E871 Hypo-osmolality and hyponatremia: Secondary | ICD-10-CM

## 2017-04-13 LAB — TYPE AND SCREEN
ABO/RH(D): B POS
ANTIBODY SCREEN: NEGATIVE
UNIT DIVISION: 0
UNIT DIVISION: 0

## 2017-04-13 LAB — BPAM RBC
BLOOD PRODUCT EXPIRATION DATE: 201811282359
BLOOD PRODUCT EXPIRATION DATE: 201812152359
ISSUE DATE / TIME: 201811211805
ISSUE DATE / TIME: 201811211434
UNIT TYPE AND RH: 1700
UNIT TYPE AND RH: 7300

## 2017-04-13 LAB — GLUCOSE, CAPILLARY
GLUCOSE-CAPILLARY: 123 mg/dL — AB (ref 65–99)
Glucose-Capillary: 144 mg/dL — ABNORMAL HIGH (ref 65–99)
Glucose-Capillary: 230 mg/dL — ABNORMAL HIGH (ref 65–99)
Glucose-Capillary: 84 mg/dL (ref 65–99)

## 2017-04-13 MED ORDER — FUROSEMIDE 40 MG PO TABS
40.0000 mg | ORAL_TABLET | Freq: Two times a day (BID) | ORAL | Status: DC
  Administered 2017-04-13 – 2017-04-17 (×9): 40 mg via ORAL
  Filled 2017-04-13 (×10): qty 1

## 2017-04-13 NOTE — Progress Notes (Signed)
[facility] PHYSICAL MEDICINE & REHABILITATION     PROGRESS NOTE  Subjective/Complaints:  Pt seen sitting at the edge of her bed this morning. He states she slept well overnight.  She also states she feels swollen.    ROS: Denies CP, SOB, nausea, vomiting, diarrhea.  Objective: Vital Signs: Blood pressure (!) 152/89, pulse (!) 101, temperature 98.1 F (36.7 C), temperature source Oral, resp. rate 18, height 5\' 10"  (1.778 m), weight (!) 157.9 kg (348 lb 1.7 oz), last menstrual period 04/06/2017, SpO2 97 %. No results found. Recent Labs    04/12/17 0734 04/12/17 1028  WBC 6.1 6.0  HGB 6.7* 6.4*  HCT 21.5* 21.2*  PLT 384 349   Recent Labs    04/12/17 0734  NA 133*  K 5.0  CL 102  GLUCOSE 225*  BUN 20  CREATININE 0.96  CALCIUM 8.8*   CBG (last 3)  Recent Labs    04/12/17 1652 04/12/17 2118 04/13/17 0619  GLUCAP 131* 128* 144*    Wt Readings from Last 3 Encounters:  04/11/17 (!) 157.9 kg (348 lb 1.7 oz)  04/07/17 (!) 159.6 kg (351 lb 13.7 oz)  03/29/17 (!) 159.6 kg (351 lb 13.7 oz)    Physical Exam:  BP (!) 152/89 (BP Location: Right Arm)   Pulse (!) 101   Temp 98.1 F (36.7 C) (Oral)   Resp 18   Ht 5\' 10"  (1.778 m)   Wt (!) 157.9 kg (348 lb 1.7 oz)   LMP 04/06/2017 (Exact Date)   SpO2 97%   BMI 49.95 kg/m  Gen: NAD. Vital signs reviewed. Obese. HENT: Normocephalic. Atraumatic. Eyes: EOM are normal.  no discharge.  Cardiovascular: RRR. No JVD.  Respiratory: Breath sounds normal. Unlabored.   GI: Bowel sounds are normal. She exhibits no distension.  Musculoskeletal: She exhibits edema and tenderness.  Neurological: She is alert and oriented.  Motor: B/l UE/RLE 5/5 proximal to distal LLE: HF 4+/5 (pain inhibition) Skin. Wound VAC in place to left lower extremity.  Psychiatric: She has a normal mood and affect. Her behavior is normal.   Assessment/Plan: 1. Functional deficits secondary to left transtibial amputation 04/07/2017 which require 3+ hours  per day of interdisciplinary therapy in a comprehensive inpatient rehab setting. Physiatrist is providing close team supervision and 24 hour management of active medical problems listed below. Physiatrist and rehab team continue to assess barriers to discharge/monitor patient progress toward functional and medical goals.  Function:  Bathing Bathing position   Position: Wheelchair/chair at sink  Bathing parts Body parts bathed by patient: Right arm, Left arm, Chest, Abdomen, Buttocks, Right upper leg, Left upper leg, Front perineal area, Back, Left lower leg, Right lower leg    Bathing assist Assist Level: Touching or steadying assistance(Pt > 75%)      Upper Body Dressing/Undressing Upper body dressing   What is the patient wearing?: Hospital gown                Upper body assist        Lower Body Dressing/Undressing Lower body dressing   What is the patient wearing?: Non-skid slipper socks         Non-skid slipper socks- Performed by patient: Don/doff right sock(left sock NA)                    Lower body assist Assist for lower body dressing: Touching or steadying assistance (Pt > 75%)      Toileting Toileting   Toileting steps completed  by patient: Adjust clothing prior to toileting, Performs perineal hygiene, Adjust clothing after toileting      Toileting assist Assist level: Touching or steadying assistance (Pt.75%)   Transfers Chair/bed transfer   Chair/bed transfer method: Stand pivot Chair/bed transfer assist level: Total assist (Pt < 25%) Chair/bed transfer assistive device: Armrests, Medical sales representative Ambulation activity did not occur: Safety/medical concerns(medical (low hemoglobin))         Wheelchair   Type: Manual Max wheelchair distance: 150 ft Assist Level: Supervision or verbal cues  Cognition Comprehension Comprehension assist level: Follows complex conversation/direction with extra time/assistive device   Expression Expression assist level: Expresses complex ideas: With no assist  Social Interaction Social Interaction assist level: Interacts appropriately with others - No medications needed.  Problem Solving Problem solving assist level: Solves complex problems: Recognizes & self-corrects  Memory Memory assist level: Complete Independence: No helper    Medical Problem List and Plan:  1. Decreased functional mobility secondary to left transtibial amputation 04/07/2017    Cont CIR 2. DVT Prophylaxis/Anticoagulation: SCD right lower extremity  3. Pain Management: Lyrica 100 mg twice a day, oxycodone and Robaxin as needed  4. Mood: Elavil 50 mg daily at bedtime  5. Neuropsych: This patient is capable of making decisions on her own behalf.  6. Skin/Wound Care: Routine skin checks  7. Fluids/Electrolytes/Nutrition: Routine I&O's  8. MRSA PCR screening positive. Contact precautions  9. Diabetes mellitus and peripheral neuropathy. Hemoglobin A1c 9.0. NovoLog 6 units 3 times a day, Lantus insulin 30 units daily at bedtime. Diabetic teaching    Monitor with increased mobility   Appears to be improving 10. ?CKD stage III.    Creatinine 0.96 on 11/21   Labs ordered for tomorrow   Continue to monitor 11. Hypertension.    Monitor with increased mobility    Will monitor for trend 12. Constipation. Laxative assistance  13. Hyperlipidemia. Lipitor  14. Morbid obesity   Body mass index is 49.95 kg/m.   Diet and exercise education   Will cont to encourage weight loss to increase endurance and promote overall health 15.  Hyponatremia   Sodium 133 on 11/21   Cont to monitor 16. ABLA   Hb 6.4 on 11/21, transfused   Repeat labs ordered 17. Peripheral edema   Lasix increased to 40 mg twice a day on 11/22   LOS (Days) 2 A FACE TO FACE EVALUATION WAS PERFORMED  Janella Rogala Lorie Phenix 04/13/2017 7:50 AM

## 2017-04-14 ENCOUNTER — Inpatient Hospital Stay (HOSPITAL_COMMUNITY): Payer: Self-pay | Admitting: Physical Therapy

## 2017-04-14 ENCOUNTER — Inpatient Hospital Stay (HOSPITAL_COMMUNITY): Payer: Self-pay | Admitting: Occupational Therapy

## 2017-04-14 ENCOUNTER — Other Ambulatory Visit: Payer: Self-pay

## 2017-04-14 ENCOUNTER — Inpatient Hospital Stay (HOSPITAL_COMMUNITY): Payer: Medicare Other | Admitting: Occupational Therapy

## 2017-04-14 DIAGNOSIS — N183 Chronic kidney disease, stage 3 unspecified: Secondary | ICD-10-CM

## 2017-04-14 LAB — CBC
HEMATOCRIT: 25.9 % — AB (ref 36.0–46.0)
HEMOGLOBIN: 7.9 g/dL — AB (ref 12.0–15.0)
MCH: 24.8 pg — ABNORMAL LOW (ref 26.0–34.0)
MCHC: 30.5 g/dL (ref 30.0–36.0)
MCV: 81.4 fL (ref 78.0–100.0)
Platelets: 340 10*3/uL (ref 150–400)
RBC: 3.18 MIL/uL — AB (ref 3.87–5.11)
RDW: 16.1 % — ABNORMAL HIGH (ref 11.5–15.5)
WBC: 6 10*3/uL (ref 4.0–10.5)

## 2017-04-14 LAB — BASIC METABOLIC PANEL
ANION GAP: 6 (ref 5–15)
BUN: 28 mg/dL — ABNORMAL HIGH (ref 6–20)
CALCIUM: 8.9 mg/dL (ref 8.9–10.3)
CO2: 28 mmol/L (ref 22–32)
Chloride: 100 mmol/L — ABNORMAL LOW (ref 101–111)
Creatinine, Ser: 1.09 mg/dL — ABNORMAL HIGH (ref 0.44–1.00)
Glucose, Bld: 142 mg/dL — ABNORMAL HIGH (ref 65–99)
POTASSIUM: 4.7 mmol/L (ref 3.5–5.1)
SODIUM: 134 mmol/L — AB (ref 135–145)

## 2017-04-14 LAB — GLUCOSE, CAPILLARY
GLUCOSE-CAPILLARY: 220 mg/dL — AB (ref 65–99)
Glucose-Capillary: 152 mg/dL — ABNORMAL HIGH (ref 65–99)
Glucose-Capillary: 143 mg/dL — ABNORMAL HIGH (ref 65–99)
Glucose-Capillary: 169 mg/dL — ABNORMAL HIGH (ref 65–99)

## 2017-04-14 MED ORDER — POLYSACCHARIDE IRON COMPLEX 150 MG PO CAPS
150.0000 mg | ORAL_CAPSULE | Freq: Two times a day (BID) | ORAL | Status: DC
  Administered 2017-04-14 – 2017-04-25 (×23): 150 mg via ORAL
  Filled 2017-04-14 (×22): qty 1

## 2017-04-14 NOTE — Progress Notes (Signed)
Occupational Therapy Session Note  Patient Details  Name: [name] [name] MRN: [id number] Date of Birth: Jun 05, 1975  Today's Date: 04/14/2017 OT Individual Time: 3343-5686 OT Individual Time Calculation (min): 47 min    Short Term Goals: Week 1:  OT Short Term Goal 1 (Week 1): STGs equal to LTGs based on ELOS  Skilled Therapeutic Interventions/Progress Updates:    Pt completed wheelchair mobility to and from the therapy gym during session with supervision.  Once in the gym she performed stand pivot transfers from wheelchair to mat with use of the RW and min assist.  Pt able to take small hops but demonstrates decreased efficiency for use of arms, as they are flexed some along with trunk, instead of demonstrating full extension to help with ease of weightbearing.  She worked on sit to stand endurance and standing balance from therapy mat.  Min assist to complete, while having pt perform table top peg activity, alternating use of UEs.  No LOB noted when attempting functional reaching at midline.  She was able to maintain standing for almost 6 mins before requesting rest break.  Next had pt use 5 lb dowel rod for hitting beach ball back and forth with therapist in sitting position.  Encouraged multiple reps of shoulder flexion to hit the ball.  She was able to complete for intervals of 1 minute.  After 3-4 intervals pt transferred stand pivot back to the wheelchair and then rolled back to the room.  Pt left in room with call button and phone in reach.    Therapy Documentation Precautions:  Precautions Precautions: Fall Precaution Comments: L BKA, wound vac Restrictions Weight Bearing Restrictions: Yes LLE Weight Bearing: Non weight bearing Other Position/Activity Restrictions: Recent BKA on LLE; wound vac  Pain: Pain Assessment Pain Assessment: Faces Pain Score: 4  Faces Pain Scale: Hurts a little bit Pain Type: Acute pain Pain Location: Leg Pain Orientation: Left Pain Descriptors  / Indicators: Discomfort Pain Onset: With Activity Patients Stated Pain Goal: 3 Pain Intervention(s): Repositioned ADL: See Function Navigator for Current Functional Status.   Therapy/Group: Individual Therapy  Zoye Chandra OTR/L 04/14/2017, 3:41 PM

## 2017-04-14 NOTE — Progress Notes (Signed)
[place] PHYSICAL MEDICINE & REHABILITATION     PROGRESS NOTE  Subjective/Complaints:  Pt states she slept well overnight.  She states she setill has some edema and doesn't like her low K+ diet.  Educated patient.   ROS: Denies CP, SOB, nausea, vomiting, diarrhea.  Objective: Vital Signs: Blood pressure 140/72, pulse (!) 101, temperature 98.1 F (36.7 C), temperature source Oral, resp. rate 20, height 5\' 10"  (1.778 m), weight (!) 157.9 kg (348 lb 1.7 oz), last menstrual period 04/06/2017, SpO2 100 %. No results found. Recent Labs    04/12/17 1028 04/14/17 0504  WBC 6.0 6.0  HGB 6.4* 7.9*  HCT 21.2* 25.9*  PLT 349 340   Recent Labs    04/12/17 0734 04/14/17 0504  NA 133* 134*  K 5.0 4.7  CL 102 100*  GLUCOSE 225* 142*  BUN 20 28*  CREATININE 0.96 1.09*  CALCIUM 8.8* 8.9   CBG (last 3)  Recent Labs    04/13/17 1202 04/13/17 1632 04/13/17 2130  GLUCAP 230* 84 123*    Wt Readings from Last 3 Encounters:  04/11/17 (!) 157.9 kg (348 lb 1.7 oz)  04/07/17 (!) 159.6 kg (351 lb 13.7 oz)  03/29/17 (!) 159.6 kg (351 lb 13.7 oz)    Physical Exam:  BP 140/72 (BP Location: Right Arm)   Pulse (!) 101   Temp 98.1 F (36.7 C) (Oral)   Resp 20   Ht 5\' 10"  (1.778 m)   Wt (!) 157.9 kg (348 lb 1.7 oz)   LMP 04/06/2017 (Exact Date)   SpO2 100%   BMI 49.95 kg/m  Gen: NAD. Vital signs reviewed. Obese. HENT: Normocephalic. Atraumatic. Eyes: EOM are normal.  no discharge.  Cardiovascular: RRR. No JVD.  Respiratory: Breath sounds normal. Unlabored.   GI: Bowel sounds are normal. She exhibits no distension.  Musculoskeletal: She exhibits edema (b/l LE) and tenderness.  Neurological: She is alert and oriented.  Motor: B/l UE/RLE 5/5 proximal to distal LLE: HF 4+/5 (pain inhibition) Skin. Wound VAC in place to left lower extremity.  Psychiatric: She has a normal mood and affect. Her behavior is normal.   Assessment/Plan: 1. Functional deficits secondary to left  transtibial amputation 04/07/2017 which require 3+ hours per day of interdisciplinary therapy in a comprehensive inpatient rehab setting. Physiatrist is providing close team supervision and 24 hour management of active medical problems listed below. Physiatrist and rehab team continue to assess barriers to discharge/monitor patient progress toward functional and medical goals.  Function:  Bathing Bathing position   Position: Wheelchair/chair at sink  Bathing parts Body parts bathed by patient: Front perineal area, Right arm, Left arm, Chest, Abdomen Body parts bathed by helper: Buttocks, Back, Right lower leg  Bathing assist Assist Level: Touching or steadying assistance(Pt > 75%)      Upper Body Dressing/Undressing Upper body dressing Upper body dressing/undressing activity did not occur: N/A What is the patient wearing?: Hospital gown                Upper body assist        Lower Body Dressing/Undressing Lower body dressing   What is the patient wearing?: Hospital Gown, Non-skid slipper socks         Non-skid slipper socks- Performed by patient: Don/doff right sock(left sock NA)                    Lower body assist Assist for lower body dressing: Touching or steadying assistance (Pt > 75%)  Toileting Toileting   Toileting steps completed by patient: Adjust clothing prior to toileting, Performs perineal hygiene, Adjust clothing after toileting      Toileting assist Assist level: Touching or steadying assistance (Pt.75%)   Transfers Chair/bed transfer   Chair/bed transfer method: Stand pivot Chair/bed transfer assist level: Total assist (Pt < 25%) Chair/bed transfer assistive device: Armrests, Medical sales representative Ambulation activity did not occur: Safety/medical concerns(medical (low hemoglobin))         Wheelchair   Type: Manual Max wheelchair distance: 150 ft Assist Level: Supervision or verbal cues  Cognition Comprehension  Comprehension assist level: Follows complex conversation/direction with extra time/assistive device  Expression Expression assist level: Expresses complex ideas: With no assist  Social Interaction Social Interaction assist level: Interacts appropriately with others - No medications needed.  Problem Solving Problem solving assist level: Solves complex problems: Recognizes & self-corrects  Memory Memory assist level: Complete Independence: No helper    Medical Problem List and Plan:  1. Decreased functional mobility secondary to left transtibial amputation 04/07/2017    Cont CIR 2. DVT Prophylaxis/Anticoagulation: SCD right lower extremity  3. Pain Management: Lyrica 100 mg twice a day, oxycodone and Robaxin as needed  4. Mood: Elavil 50 mg daily at bedtime  5. Neuropsych: This patient is capable of making decisions on her own behalf.  6. Skin/Wound Care: Routine skin checks  7. Fluids/Electrolytes/Nutrition: Routine I&O's    Low K+ diet due to history of CKD and hyperkalemia, will consider changing back to regular diet 8. MRSA PCR screening positive. Contact precautions  9. Diabetes mellitus and peripheral neuropathy. Hemoglobin A1c 9.0. NovoLog 6 units 3 times a day, Lantus insulin 30 units daily at bedtime. Diabetic teaching    Monitor with increased mobility   Slightly labile, overall improving, monitor for trend 10. ?CKD stage III.    Creatinine 1.09 on 11/23   Labs ordered for Monday   Continue to monitor 11. Hypertension.    Monitor with increased mobility    Overall controlled, will consider changes if persistently elevated 12. Constipation. Laxative assistance  13. Hyperlipidemia. Lipitor  14. Morbid obesity   Body mass index is 49.95 kg/m.   Diet and exercise education   Will cont to encourage weight loss to increase endurance and promote overall health 15.  Hyponatremia   Sodium 134 on 11/23   Labs ordered for Monday   Cont to monitor 16. ABLA   Hb 7.9 on 11/23, after  transfused   Labs ordered for Monday 17. Peripheral edema   Lasix increased to 40 mg twice a day on 11/22  LOS (Days) 3 A FACE TO FACE EVALUATION WAS PERFORMED  Obrien Huskins Lorie Phenix 04/14/2017 7:29 AM

## 2017-04-14 NOTE — Progress Notes (Signed)
Physical Therapy Session Note  Patient Details  Name: [name] [name] MRN: [id number] Date of Birth: [birth year]  Today's Date: 04/14/2017 PT Individual Time: 0901-1000 PT Individual Time Calculation (min): 59 min   Short Term Goals: Week 1:  PT Short Term Goal 1 (Week 1): Pt will perform transfers with supervision PT Short Term Goal 2 (Week 1): Pt will initiate gait training PT Short Term Goal 3 (Week 1): Pt will perform sit<>stand with supervision   Skilled Therapeutic Interventions/Progress Updates:   Pt in w/c upon arrival and agreeable to therapy, c/o residual limb pain as detailed below, but tolerable. Per RN and PA-C, pt is no longer on bedside therapy hold from 11/21. Worked on endurance and independence w/ functional mobility this session. Pt self-propelled w/c to ortho gym w/ supervision. Initiated gait w/ RW this session, pt ambulated 10' x3 w/ Min guard and verbal cues for precautions/safety w/ RW management and energy conservation. Also performed car transfer w/ Min guard using RW. Seated rest breaks in between bouts 2/2 fatigue. During seated rest breaks, educated pt on residual limb care in preparation for prosthesis and to decreased residual limb pain. Discussed desensitization techniques, stretching to prevent contractures, and residual limb shaping w/ ace wraps and/or shrinker. Provided w/ written handout of information. Pt verbalized understanding and appreciative of information. Total A w/c transport to day room for UE group session. Re-wrapped pt's RLE for edema management while educating pt on purposes of wrapping, also provided w/ elevating leg rest and reminded pt of elevating leg frequently. Ended session in care of staff in day room, all needs met.   Therapy Documentation Precautions:  Precautions Precautions: Fall Precaution Comments: L BKA, wound vac Restrictions Weight Bearing Restrictions: Yes LLE Weight Bearing: Non weight bearing Other Position/Activity  Restrictions: Recent BKA on LLE; wound vac Pain: Pain Assessment Pain Assessment: 0-10 Pain Score: 0-No pain Faces Pain Scale: No hurt Pain Type: Acute pain Pain Location: Leg Pain Orientation: Left Pain Descriptors / Indicators: Throbbing Pain Onset: On-going Patients Stated Pain Goal: 3 Pain Intervention(s): Medication (See eMAR)  See Function Navigator for Current Functional Status.   Therapy/Group: Individual Therapy  Emmarose Klinke K Arnette 04/14/2017, 11:55 AM

## 2017-04-14 NOTE — Progress Notes (Addendum)
Occupational Therapy Session Note  Patient Details  Name: [name] [name] MRN: [id number] Date of Birth: May 06, 1976  Today's Date: 04/14/2017 OT Group Time: 1000-1100 OT Group Time Calculation (min): 60 min  UE exercise with 2-4 lbs weights and orange theraband to focus on strengthening and cardiovascular endurance in group setting. Addressed all UE mm groups. Also perform stretching program. Provided theraband exercises pt can perform in their room as HEP. Pt able to self propel back to their room with distant supervision to mod I with extra time with bilateral UEs and LEs.   Short Term Goals: Week 1:  OT Short Term Goal 1 (Week 1): STGs equal to LTGs based on ELOS Week 2:     Skilled Therapeutic Interventions/Progress Updates:    1:1 13:00-13:40 2nd session focus on self care retraining at sink level with focus on sit to stands, standing balance with UE support, management of wound VAC with mobility and w/c mechanics.  Pt able to perform sit to stand with min guard with extra time. Pt still with no clothes to don- so hospital gowns donned. Right Le ace wrapped for edema control; pt can don right blue sock with supervision. Mod I for grooming at the sink.    Therapy Documentation Precautions:  Precautions Precautions: Fall Precaution Comments: L BKA, wound vac Restrictions Weight Bearing Restrictions: Yes LLE Weight Bearing: Non weight bearing Other Position/Activity Restrictions: Recent BKA on LLE; wound vac Pain: No reports of pain in sessions See Function Navigator for Current Functional Status.   Therapy/Group: Individual Therapy and Group Therapy  Willeen Cass [facility] 04/14/2017, 2:31 PM

## 2017-04-15 ENCOUNTER — Inpatient Hospital Stay (HOSPITAL_COMMUNITY): Payer: Self-pay | Admitting: Physical Therapy

## 2017-04-15 ENCOUNTER — Inpatient Hospital Stay (HOSPITAL_COMMUNITY): Payer: Self-pay

## 2017-04-15 ENCOUNTER — Inpatient Hospital Stay (HOSPITAL_COMMUNITY): Payer: Medicare Other | Admitting: Occupational Therapy

## 2017-04-15 LAB — GLUCOSE, CAPILLARY
GLUCOSE-CAPILLARY: 213 mg/dL — AB (ref 65–99)
GLUCOSE-CAPILLARY: 165 mg/dL — AB (ref 65–99)
Glucose-Capillary: 173 mg/dL — ABNORMAL HIGH (ref 65–99)
Glucose-Capillary: 280 mg/dL — ABNORMAL HIGH (ref 65–99)

## 2017-04-15 MED ORDER — CANAGLIFLOZIN 100 MG PO TABS
100.0000 mg | ORAL_TABLET | Freq: Every day | ORAL | Status: DC
  Administered 2017-04-16 – 2017-04-19 (×4): 100 mg via ORAL
  Filled 2017-04-15 (×5): qty 1

## 2017-04-15 NOTE — Progress Notes (Signed)
Physical Therapy Session Note  Patient Details  Name: [name] [name] MRN: [id number] Date of Birth: 07-29-1975  Today's Date: 04/15/2017 PT Individual Time: 1000-1100 PT Individual Time Calculation (min): 60 min   Short Term Goals: Week 1:  PT Short Term Goal 1 (Week 1): Pt will perform transfers with supervision PT Short Term Goal 2 (Week 1): Pt will initiate gait training PT Short Term Goal 3 (Week 1): Pt will perform sit<>stand with supervision   Skilled Therapeutic Interventions/Progress Updates:   Pt in w/c upon arrival and agreeable to therapy, no c/o pain. Worked on w/c mobility this session while self-propelling w/c to/from w/c using BUEs. Increased endurance this session as pt required decreased rest breaks and able to self-propel in 100-150' bouts before stopping. Additionally practiced self-propelling w/c around obstacles and tight spaces w/ supervision and occasional verbal cues for technique. Pt also w/ increased independence w/ w/c part management this session. Worked on functional strengthening this session from mat table. Pt transferred to/from mat table w/ Min guard to close supervision using RW and verbal cues for safety. Performed 4x5 UE press-ups, 5x sit<>stand, and 3x10 partial knee bends. Continued education of residual limb care and discussed purpose of UE press-ups to assist w/ pressure relief as pt spends increased time in w/c compared to before amputation, especially as pt has had skin breakdown in the past. Pt verbalized understanding and in agreement. Returned to room and ended session in w/c, call bell within reach and all needs met.   Therapy Documentation Precautions:  Precautions Precautions: Fall Precaution Comments: L BKA, wound vac Restrictions Weight Bearing Restrictions: Yes LLE Weight Bearing: Non weight bearing Other Position/Activity Restrictions: Recent BKA on LLE; wound vac Vital Signs: Therapy Vitals Temp: (P) 98.5 F (36.9 C) Temp  Source: (P) Oral Pain: Pain Assessment Pain Assessment: No/denies pain  See Function Navigator for Current Functional Status.   Therapy/Group: Individual Therapy  Ricard Faulkner K Arnette 04/15/2017, 5:14 PM

## 2017-04-15 NOTE — Progress Notes (Signed)
Occupational Therapy Note  Patient Details  Name: CHARNA NEEB MRN: [id number] Date of Birth: [birth year]  Today's Date: 04/15/2017 OT Individual Time: 1300-1330 OT Individual Time Calculation (min): 30 min   Pt c/o 4/10 pain in LLE; RN aware and repositioned Individual Therapy  Pt resting in w/c with family present.  Pt engaged in BUE therex with 1kg and 2kg balls including PNF, straight arm raises, and biceps curls.  Focus on BUE therex to increase independence with BADLs and functional transfers.    Leotis Shames [facility] 04/15/2017, 1:34 PM

## 2017-04-15 NOTE — Progress Notes (Signed)
[place] PHYSICAL MEDICINE & REHABILITATION     PROGRESS NOTE  Subjective/Complaints:  Still concerned about her edema .  Has questions about when her vacuum can come off.  Pain seems to be under fair control  ROS: pt denies nausea, vomiting, diarrhea, cough, shortness of breath or chest pain   Objective: Vital Signs: Blood pressure 135/77, pulse (!) 103, temperature 98.6 F (37 C), temperature source Oral, resp. rate 18, height 5\' 10"  (1.778 m), weight (!) 157.9 kg (348 lb 1.7 oz), last menstrual period 04/06/2017, SpO2 100 %. No results found. Recent Labs    04/14/17 0504  WBC 6.0  HGB 7.9*  HCT 25.9*  PLT 340   Recent Labs    04/14/17 0504  NA 134*  K 4.7  CL 100*  GLUCOSE 142*  BUN 28*  CREATININE 1.09*  CALCIUM 8.9   CBG (last 3)  Recent Labs    04/14/17 1645 04/14/17 2152 04/15/17 0712  GLUCAP 169* 220* 213*    Wt Readings from Last 3 Encounters:  04/11/17 (!) 157.9 kg (348 lb 1.7 oz)  04/07/17 (!) 159.6 kg (351 lb 13.7 oz)  03/29/17 (!) 159.6 kg (351 lb 13.7 oz)    Physical Exam:  BP 135/77 (BP Location: Right Arm)   Pulse (!) 103   Temp 98.6 F (37 C) (Oral)   Resp 18   Ht 5\' 10"  (1.778 m)   Wt (!) 157.9 kg (348 lb 1.7 oz)   LMP 04/06/2017 (Exact Date)   SpO2 100%   BMI 49.95 kg/m  Gen: NAD. Vital signs reviewed. Obese. HENT: Normocephalic. Atraumatic. Eyes: EOM are normal.  no discharge.  Cardiovascular: RRR without murmur. No JVD  Respiratory:  Normal respirations  GI: Bowel sounds are normal. She exhibits no distension.  Musculoskeletal: She exhibits ongoing edema (b/l LE) and tenderness.  Neurological: She is alert and oriented.  Motor: B/l UE/RLE 5/5 proximal to distal LLE: HF 4+/5 (pain inhibition) Skin. Wound VAC in place to left lower extremity, no drainage in canister.  Psychiatric: She has a normal mood and affect. Her behavior is normal.   Assessment/Plan: 1. Functional deficits secondary to left transtibial amputation  04/07/2017 which require 3+ hours per day of interdisciplinary therapy in a comprehensive inpatient rehab setting. Physiatrist is providing close team supervision and 24 hour management of active medical problems listed below. Physiatrist and rehab team continue to assess barriers to discharge/monitor patient progress toward functional and medical goals.  Function:  Bathing Bathing position   Position: Wheelchair/chair at sink  Bathing parts Body parts bathed by patient: Front perineal area, Right arm, Left arm, Chest, Abdomen, Buttocks, Right upper leg, Left upper leg, Right lower leg Body parts bathed by helper: Back  Bathing assist Assist Level: Touching or steadying assistance(Pt > 75%)      Upper Body Dressing/Undressing Upper body dressing Upper body dressing/undressing activity did not occur: N/A What is the patient wearing?: Hospital gown                Upper body assist        Lower Body Dressing/Undressing Lower body dressing   What is the patient wearing?: Hospital Gown         Non-skid slipper socks- Performed by patient: Don/doff right sock                    Lower body assist Assist for lower body dressing: Touching or steadying assistance (Pt > 75%)  Toileting Toileting   Toileting steps completed by patient: Adjust clothing prior to toileting, Performs perineal hygiene, Adjust clothing after toileting      Toileting assist Assist level: Touching or steadying assistance (Pt.75%)   Transfers Chair/bed transfer   Chair/bed transfer method: Stand pivot Chair/bed transfer assist level: Touching or steadying assistance (Pt > 75%) Chair/bed transfer assistive device: Armrests, Walker     Locomotion Ambulation Ambulation activity did not occur: Safety/medical concerns(medical (low hemoglobin))   Max distance: 10' Assist level: Touching or steadying assistance (Pt > 75%)   Wheelchair   Type: Manual Max wheelchair distance: 150 ft Assist  Level: Supervision or verbal cues  Cognition Comprehension Comprehension assist level: Follows complex conversation/direction with extra time/assistive device  Expression Expression assist level: Expresses complex ideas: With no assist  Social Interaction Social Interaction assist level: Interacts appropriately with others - No medications needed.  Problem Solving Problem solving assist level: Solves complex problems: Recognizes & self-corrects  Memory Memory assist level: Complete Independence: No helper    Medical Problem List and Plan:  1. Decreased functional mobility secondary to left transtibial amputation 04/07/2017    Cont CIR 2. DVT Prophylaxis/Anticoagulation: SCD right lower extremity  3. Pain Management: Lyrica 100 mg twice a day, oxycodone and Robaxin as needed  4. Mood: Elavil 50 mg daily at bedtime  5. Neuropsych: This patient is capable of making decisions on her own behalf.  6. Skin/Wound Care: Routine skin checks     -vac has been on over a week, d/c today----use dry dressing 7. Fluids/Electrolytes/Nutrition: Routine I&O's    Low K+ diet due to history of CKD and hyperkalemia, will consider changing back to regular diet 8. MRSA PCR screening positive. Contact precautions  9. Diabetes mellitus and peripheral neuropathy. Hemoglobin A1c 9.0. NovoLog 6 units 3 times a day, Lantus insulin 30 units daily at bedtime. Diabetic teaching    Labile to poorly controlled, resume invokana at 100mg  qam (on 300mg  at home) 10. ?CKD stage III.    Creatinine 1.09 on 11/23   Labs ordered for Monday   Continue to monitor 11. Hypertension.    Monitor with increased mobility    Overall controlled, will consider changes if persistently elevated 12. Constipation. Laxative assistance  13. Hyperlipidemia. Lipitor  14. Morbid obesity   Body mass index is 49.95 kg/m.   Diet and exercise education   Will cont to encourage weight loss to increase endurance and promote overall health 15.   Hyponatremia   Sodium 134 on 11/23   Labs ordered for Monday   Cont to monitor 16. ABLA   Hb 7.9 on 11/23, after transfused   Labs ordered for Monday 17. Peripheral edema   Lasix increased to 40 mg twice a day on 11/22     -watch BUN/Cr LOS (Days) 4  A FACE TO FACE EVALUATION WAS PERFORMED  SWARTZ,ZACHARY T 04/15/2017 11:01 AM

## 2017-04-15 NOTE — Progress Notes (Addendum)
Pt has continuously pain, notes as phantom pain. Pain is constantly, throbbing she states no relief. Administered barrier cream on bottom, no open areas. Wound Vac d/c stump appears health minimal drainage/staples in place. Administered dressing as ordered.

## 2017-04-15 NOTE — Progress Notes (Signed)
Occupational Therapy Session Note  Patient Details  Name: LATASHA BUCZKOWSKI MRN: [id number] Date of Birth: 1975/07/16  Today's Date: 04/15/2017 OT Individual Time: 8592-9244 OT Individual Time Calculation (min): 30 min    Short Term Goals: Week 1:  OT Short Term Goal 1 (Week 1): STGs equal to LTGs based on ELOS  Skilled Therapeutic Interventions/Progress Updates:    Therapist pushed pt down to the dayroom to start session.  Worked on BUE strengthening and endurance with use of the UE Ergonometer.  Had pt complete 2 sets of 5 mins with resistance on level 10.  She completed one peddling forward and one peddling backwards.  RPMs maintained at 25-27 throughout set.  HR increased to 112 with O2 at 90% post each set.  O2 maintained at 91% on room air after 3 mins of rest as well.  Finished session by having pt propel her wheelchair back to the room with supervision and increased time.  Pt left in wheelchair with call button and phone in reach.    Therapy Documentation Precautions:  Precautions Precautions: Fall Precaution Comments: L BKA, wound vac Restrictions Weight Bearing Restrictions: Yes LLE Weight Bearing: Non weight bearing Other Position/Activity Restrictions: Recent BKA on LLE; wound vac Pain: Pain Assessment Pain Assessment: No/denies pain Pain Score: 8  ADL: See Function Navigator for Current Functional Status.   Therapy/Group: Individual Therapy  Calvina Liptak OTR/L 04/15/2017, 3:47 PM

## 2017-04-15 NOTE — Progress Notes (Signed)
Occupational Therapy Session Note  Patient Details  Name: [name] [name] MRN: [id number] Date of Birth: 30-Oct-1975  Today's Date: 04/15/2017 OT Individual Time: 0800-0900 OT Individual Time Calculation (min): 60 min    Short Term Goals: Week 1:  OT Short Term Goal 1 (Week 1): STGs equal to LTGs based on ELOS  Skilled Therapeutic Interventions/Progress Updates:    Pt completed bathing and dressing sit to stand at the sink during session.  She was able to propel her wheelchair from the side of the bed to the sink with supervision.  UB bathing completed at supervision level with grooming tasks at modified independent level from wheelchair.  Pt completed sit to stand and standing with min assist while washing peri area and allowing for hospital gowns to fall down below her buttocks so she would be covered.  She moved her wheelchair from the sink over to the bed in order to prop up her RLE for applying Eucerin cream.  Therapist applied ace wraps to RLE secondary to increased edema and then she was able to donn her gripper sock with the RLE propped on a stool.  Pt left with call button and phone in reach at end of session and nursing just recently given pain meds.    Therapy Documentation Precautions:  Precautions Precautions: Fall Precaution Comments: L BKA, wound vac Restrictions Weight Bearing Restrictions: Yes LLE Weight Bearing: Non weight bearing Other Position/Activity Restrictions: Recent BKA on LLE; wound vac  Pain: Pain Assessment Pain Assessment: 0-10 Pain Score: 7  Pain Type: Surgical pain Pain Location: Leg Pain Orientation: Left Pain Onset: With Activity Pain Intervention(s): Medication (See eMAR);Repositioned ADL: See Function Navigator for Current Functional Status.   Therapy/Group: Individual Therapy  Dafne Nield OTR/L 04/15/2017, 9:00 AM

## 2017-04-15 NOTE — Plan of Care (Signed)
Pt reports that pain is constant and pain medication only some relief. Pt reports that she is feeling phantom pain. Pt educated on phantom pain and desensitization. Pt has order for 10 mg oxycodone q3hrs PRN. Pt is asking for pain medication approx q6hrs. Pt educated on asking for medication before pain gets too bad.

## 2017-04-16 ENCOUNTER — Inpatient Hospital Stay (HOSPITAL_COMMUNITY): Payer: Self-pay | Admitting: Occupational Therapy

## 2017-04-16 ENCOUNTER — Inpatient Hospital Stay (HOSPITAL_COMMUNITY): Payer: Self-pay | Admitting: Physical Therapy

## 2017-04-16 ENCOUNTER — Inpatient Hospital Stay (HOSPITAL_COMMUNITY): Payer: Self-pay

## 2017-04-16 LAB — GLUCOSE, CAPILLARY
GLUCOSE-CAPILLARY: 133 mg/dL — AB (ref 65–99)
Glucose-Capillary: 215 mg/dL — ABNORMAL HIGH (ref 65–99)
Glucose-Capillary: 143 mg/dL — ABNORMAL HIGH (ref 65–99)
Glucose-Capillary: 146 mg/dL — ABNORMAL HIGH (ref 65–99)

## 2017-04-16 MED ORDER — TRAZODONE HCL 50 MG PO TABS
50.0000 mg | ORAL_TABLET | Freq: Every evening | ORAL | Status: DC | PRN
  Administered 2017-04-17 – 2017-04-24 (×7): 50 mg via ORAL
  Filled 2017-04-16 (×9): qty 1

## 2017-04-16 MED ORDER — OXYCODONE HCL 5 MG PO TABS
10.0000 mg | ORAL_TABLET | ORAL | Status: DC | PRN
  Administered 2017-04-16 – 2017-04-25 (×37): 10 mg via ORAL
  Filled 2017-04-16 (×38): qty 2

## 2017-04-16 MED ORDER — PREGABALIN 50 MG PO CAPS
100.0000 mg | ORAL_CAPSULE | Freq: Three times a day (TID) | ORAL | Status: DC
  Administered 2017-04-16 – 2017-04-25 (×27): 100 mg via ORAL
  Filled 2017-04-16 (×27): qty 2

## 2017-04-16 NOTE — Progress Notes (Addendum)
Patient is demanding to keep her bed alarm off. Patient is also choosing to sit on the edge of the bed despite numerous attempts of educating and trying to get patient in safer position. Staff has educated patient of risk and policy. Patient still choosing to keep her bed alarm off and sit at the edge of the bed. She states she understands but she still wants the bed alarm off and will remain at the edge of the bed.

## 2017-04-16 NOTE — Progress Notes (Signed)
Occupational Therapy Session Note  Patient Details  Name: [name] [name] MRN: [id number] Date of Birth: Apr 21, 1976  Today's Date: 04/16/2017 OT Individual Time: 1400-1443 OT Individual Time Calculation (min): 43 min    Short Term Goals: Week 1:  OT Short Term Goal 1 (Week 1): STGs equal to LTGs based on ELOS  Skilled Therapeutic Interventions/Progress Updates:    1:1. Pt requires VC to lock brakes prior to reaching forward to don sock with foot elevated on stool. Ot cues pt to thread sock over all 5 toes prior to pulling up heel. Pt propels w/c to/from dayroom with VC for pursed lip breathing d/t SOB. Pt hops 10 feet  With CGA over carpeted area in prep for d/c home as entire home has carpet. Pt propels chair over carpet with supervision. Exited session with pt seated in w/c with call light in reach and all needs met.  Therapy Documentation Precautions:  Precautions Precautions: Fall Precaution Comments: L BKA, wound vac Restrictions Weight Bearing Restrictions: Yes LLE Weight Bearing: Non weight bearing Other Position/Activity Restrictions: Recent BKA on LLE; wound vac General:   Vital Signs:  Pain: Pain Assessment Pain Assessment: 0-10 Pain Score: 4  Pain Type: Neuropathic pain Pain Location: Leg Pain Orientation: Left Pain Descriptors / Indicators: Shooting;Sharp Pain Onset: Sudden Patients Stated Pain Goal: 3 Pain Intervention(s): Medication (See eMAR)  See Function Navigator for Current Functional Status.   Therapy/Group: Individual Therapy  [name] [name] 04/16/2017, 2:44 PM

## 2017-04-16 NOTE — Plan of Care (Signed)
  Progressing Consults RH LIMB LOSS PATIENT EDUCATION Description Description: See Patient Education module for eduction specifics. 04/16/2017 0355 - Progressing by Suella Grove, RN RH SKIN INTEGRITY RH STG SKIN FREE OF INFECTION/BREAKDOWN Description Min assist  04/16/2017 0355 - Progressing by Suella Grove, RN 04/16/2017 0243 - Progressing by Suella Grove, RN RH STG ABLE TO PERFORM INCISION/WOUND CARE W/ASSISTANCE Description STG Able To Perform Incision/Wound Care With max Assistance.  04/16/2017 0355 - Progressing by Suella Grove, RN RH SAFETY RH STG ADHERE TO SAFETY PRECAUTIONS W/ASSISTANCE/DEVICE Description STG Adhere to Safety Precautions With min Assistance/Device.  04/16/2017 0355 - Progressing by Suella Grove, RN 04/16/2017 0243 - Progressing by Suella Grove, RN RH PAIN MANAGEMENT RH STG PAIN MANAGED AT OR BELOW PT'S PAIN GOAL Description 4 or less  04/16/2017 0355 - Progressing by Suella Grove, RN 04/16/2017 0243 - Progressing by Suella Grove, RN RH KNOWLEDGE DEFICIT LIMB LOSS RH STG INCREASE KNOWLEDGE OF SELF CARE AFTER LIMB LOSS Description Supervision  04/16/2017 0355 - Progressing by Suella Grove, RN

## 2017-04-16 NOTE — Progress Notes (Signed)
Occupational Therapy Session Note  Patient Details  Name: THANH POMERLEAU MRN: [id number] Date of Birth: 10-25-1975  Today's Date: 04/16/2017 OT Individual Time: 1300-1333 OT Individual Time Calculation (min): 33 min    Short Term Goals: Week 1:  OT Short Term Goal 1 (Week 1): STGs equal to LTGs based on ELOS  Skilled Therapeutic Interventions/Progress Updates:    Pt completed bathing and dressing sit to stand at the sink.  She was able to complete all UB selfcare in sitting with supervision.  LB bathing sit to stand with min assist for balance.  She donned therapy gowns secondary to not having any clothing.  Did not complete washing lower leg and foot during session secondary to not having enough time.  Pt left at sink to complete grooming tasks at end of session.    Therapy Documentation Precautions:  Precautions Precautions: Fall Precaution Comments: L BKA, wound vac Restrictions Weight Bearing Restrictions: Yes LLE Weight Bearing: Non weight bearing Other Position/Activity Restrictions: Recent BKA on LLE; wound vac  Pain: Pain Assessment Pain Assessment: Faces Pain Score: 2  Pain Type: Surgical pain Pain Location: Leg Pain Descriptors / Indicators: Discomfort ADL: See Function Navigator for Current Functional Status.   Therapy/Group: Individual Therapy  Mckale Haffey OTR/L 04/16/2017, 4:09 PM

## 2017-04-16 NOTE — Progress Notes (Signed)
[place] PHYSICAL MEDICINE & REHABILITATION     PROGRESS NOTE  Subjective/Complaints:  Complains of ongoing stump pain as well as phantom limb symptoms.  Has been only using oxycodone every 6 or 7 hours as needed.  Also complains of some insomnia and feels that she is not urinating as much as she should be.  Output was not recorded yesterday  ROS: pt denies nausea, vomiting, diarrhea, cough, shortness of breath or chest pain   Objective: Vital Signs: Blood pressure 134/71, pulse (!) 104, temperature 98 F (36.7 C), temperature source Oral, resp. rate 20, height 5\' 10"  (1.778 m), weight (!) 157.9 kg (348 lb 1.7 oz), last menstrual period 04/06/2017, SpO2 98 %. No results found. Recent Labs    04/14/17 0504  WBC 6.0  HGB 7.9*  HCT 25.9*  PLT 340   Recent Labs    04/14/17 0504  NA 134*  K 4.7  CL 100*  GLUCOSE 142*  BUN 28*  CREATININE 1.09*  CALCIUM 8.9   CBG (last 3)  Recent Labs    04/15/17 1646 04/15/17 2119 04/16/17 0632  GLUCAP 165* 280* 215*    Wt Readings from Last 3 Encounters:  04/11/17 (!) 157.9 kg (348 lb 1.7 oz)  04/07/17 (!) 159.6 kg (351 lb 13.7 oz)  03/29/17 (!) 159.6 kg (351 lb 13.7 oz)    Physical Exam:  BP 134/71 (BP Location: Left Arm)   Pulse (!) 104   Temp 98 F (36.7 C) (Oral)   Resp 20   Ht 5\' 10"  (1.778 m)   Wt (!) 157.9 kg (348 lb 1.7 oz)   LMP 04/06/2017 (Exact Date)   SpO2 98%   BMI 49.95 kg/m  Gen: NAD. Vital signs reviewed. Obese. HENT: Normocephalic. Atraumatic. Eyes: EOM are normal.  no discharge.  Cardiovascular: RRR without murmur. No JVD   Respiratory:  CTA Bilaterally without wheezes or rales. Normal effort   GI: Bowel sounds are normal. She exhibits no distension.  Musculoskeletal: She exhibits ongoing edema (b/l LE) and tenderness.  Neurological: She is alert and oriented.  Motor: B/l UE/RLE 5/5 proximal to distal LLE: HF 4+/5 (pain inhibition) Skin.  Incision clean and intact with mild serosanguineous drainage.   A few areas along incision are moist and adhering to dressing.  Psychiatric: She has a normal mood and affect. Her behavior is normal.   Assessment/Plan: 1. Functional deficits secondary to left transtibial amputation 04/07/2017 which require 3+ hours per day of interdisciplinary therapy in a comprehensive inpatient rehab setting. Physiatrist is providing close team supervision and 24 hour management of active medical problems listed below. Physiatrist and rehab team continue to assess barriers to discharge/monitor patient progress toward functional and medical goals.  Function:  Bathing Bathing position   Position: Wheelchair/chair at sink  Bathing parts Body parts bathed by patient: Front perineal area, Right arm, Left arm, Chest, Abdomen, Buttocks, Right upper leg, Left upper leg, Right lower leg Body parts bathed by helper: Back  Bathing assist Assist Level: Touching or steadying assistance(Pt > 75%)      Upper Body Dressing/Undressing Upper body dressing Upper body dressing/undressing activity did not occur: N/A What is the patient wearing?: Hospital gown                Upper body assist        Lower Body Dressing/Undressing Lower body dressing   What is the patient wearing?: Hospital Gown         Non-skid slipper socks- Performed by patient:  Don/doff right sock                    Lower body assist Assist for lower body dressing: Touching or steadying assistance (Pt > 75%)      Toileting Toileting   Toileting steps completed by patient: Performs perineal hygiene Toileting steps completed by helper: Adjust clothing prior to toileting, Adjust clothing after toileting Toileting Assistive Devices: Grab bar or rail  Toileting assist Assist level: Touching or steadying assistance (Pt.75%)   Transfers Chair/bed transfer   Chair/bed transfer method: Stand pivot Chair/bed transfer assist level: Touching or steadying assistance (Pt > 75%) Chair/bed transfer  assistive device: Armrests, Walker     Locomotion Ambulation Ambulation activity did not occur: Safety/medical concerns(medical (low hemoglobin))   Max distance: 10' Assist level: Touching or steadying assistance (Pt > 75%)   Wheelchair   Type: Manual Max wheelchair distance: 150 ft Assist Level: Supervision or verbal cues  Cognition Comprehension Comprehension assist level: Follows basic conversation/direction with no assist  Expression Expression assist level: Expresses basic needs/ideas: With no assist  Social Interaction Social Interaction assist level: Interacts appropriately 90% of the time - Needs monitoring or encouragement for participation or interaction.  Problem Solving Problem solving assist level: Solves basic 90% of the time/requires cueing < 10% of the time  Memory Memory assist level: Complete Independence: No helper    Medical Problem List and Plan:  1. Decreased functional mobility secondary to left transtibial amputation 04/07/2017    Cont CIR 2. DVT Prophylaxis/Anticoagulation: SCD right lower extremity  3. Pain Management: Lyrica 100 mg twice a day--- increase to 3 times daily    - oxycodone and Robaxin as needed have actually decreased oxycodone to every 4 hours PRN. She needs to use it more regularly if needed.  4. Mood: Elavil 50 mg daily at bedtime    - 5. Neuropsych: This patient is capable of making decisions on her own behalf.  6. Skin/Wound Care: Routine skin checks     -Have changed her wound dressing to an oil immersion dressing with Kerlix and shrinker over top to be changed daily 7. Fluids/Electrolytes/Nutrition: Routine I&O's have added low-dose trazodone to help with sleep   Low K+ diet due to history of CKD and hyperkalemia, will consider changing back to regular diet 8. MRSA PCR screening positive. Contact precautions  9. Diabetes mellitus and peripheral neuropathy. Hemoglobin A1c 9.0. NovoLog 6 units 3 times a day, Lantus insulin 30 units  daily at bedtime. Diabetic teaching    Labile to poorly controlled, resumed invokana at 100mg  qam (on 300mg  at home) 10. ?CKD stage III.    Creatinine 1.09 on 11/23   Labs ordered for Monday   Continue to monitor 11. Hypertension.    Monitor with increased mobility    Overall controlled 12. Constipation. Laxative assistance  13. Hyperlipidemia. Lipitor  14. Morbid obesity   Body mass index is 49.95 kg/m.   Diet and exercise education   Will cont to encourage weight loss to increase endurance and promote overall health 15.  Hyponatremia   Sodium 134 on 11/23   Labs ordered for Monday   Cont to monitor 16. ABLA   Hb 7.9 on 11/23, after transfused   Labs ordered for Monday 17. Peripheral edema   Lasix increased to 40 mg twice a day on 11/22     -watch BUN/Cr---follow up tomorrow      -Needs to do a better job following her I's and O's LOS (Days)  5  A FACE TO FACE EVALUATION WAS PERFORMED  [name] [name] T 04/16/2017 9:16 AM

## 2017-04-16 NOTE — Plan of Care (Signed)
  Progressing RH SKIN INTEGRITY RH STG SKIN FREE OF INFECTION/BREAKDOWN Description Min assist  04/16/2017 0243 - Progressing by Suella Grove, RN RH SAFETY RH STG ADHERE TO SAFETY PRECAUTIONS W/ASSISTANCE/DEVICE Description STG Adhere to Safety Precautions With min Assistance/Device.  04/16/2017 0243 - Progressing by Suella Grove, RN RH PAIN MANAGEMENT RH STG PAIN MANAGED AT OR BELOW PT'S PAIN GOAL Description 4 or less  04/16/2017 0243 - Progressing by Suella Grove, RN

## 2017-04-16 NOTE — Progress Notes (Addendum)
Physical Therapy Session Note  Patient Details  Name: [name] [name] MRN: [id number] Date of Birth: 10/18/1975  Today's Date: 04/16/2017 PT Individual Time: 0900-1000 AND 1600-1700 PT Individual Time Calculation (min): 60 min AND 60 min  Short Term Goals: Week 1:  PT Short Term Goal 1 (Week 1): Pt will perform transfers with supervision PT Short Term Goal 2 (Week 1): Pt will initiate gait training PT Short Term Goal 3 (Week 1): Pt will perform sit<>stand with supervision   Skilled Therapeutic Interventions/Progress Updates:   Session 1:  Pt in w/c upon arrival and agreeable to therapy, c/o of phantom limb pain but intermittent. Requesting to toilet. Worked on ambulating in and out of bathroom to reach toilet w/ supervision. Pt able to perform pericare w/ set-up assist and changed gowns w/ set-up assist as well. Pt self-propelled w/c to/from gym w/ supervision using BUEs, no rest break going 150'+ each way. Worked on hopping in parallel bars w/ emphasis on gentle unweighting of RLE for hops rather than quick sets. Verbal and visual cues for using UEs to lift RLE off floor. Discussed importance of being able to gently lift RLE in a controlled manner in order to negotiate the one step pt has to enter home. She had increased difficulty performing this, able to hop w/ supervision, but uncontrolled and quick. Will continue to work on this in anticipation of discharge home as pt's body weight may prevent safe bumping up stair in w/c. Returned to room and ended session in w/c, call bell within reach and all needs met.   Session 2:  Pt in w/c upon arrival and agreeable to therapy, no c/o pain. Worked on UE strengthening this session w/ goal of increasing strength to lift body weight during gait and taking steps. Pt self-propelled w/c to/from day room w/ supervision using BUEs w/o rest breaks, ~250' each way. Performed arm ergometer for 10 min and 5 min @ L 3.5. Encouraged carrying on conversation  during activity to work on cardiorespiratory endurance in addition to incorporating breathing techniques into functional activity. Had 1 bout of gait on way back towards room, pt ambulated 25' w/ RW and supervision w/ w/c follow for safety. Moderate increase in work of breathing, however no fatigue noted in quality of gait pattern. Returned to room and ended session in w/c, call bell within reach and all needs met.   Therapy Documentation Precautions:  Precautions Precautions: Fall Precaution Comments: L BKA, wound vac Restrictions Weight Bearing Restrictions: Yes LLE Weight Bearing: Non weight bearing Other Position/Activity Restrictions: Recent BKA on LLE; wound vac  See Function Navigator for Current Functional Status.   Therapy/Group: Individual Therapy  Maijor Hornig K Arnette 04/16/2017, 12:12 PM

## 2017-04-17 ENCOUNTER — Inpatient Hospital Stay (HOSPITAL_COMMUNITY): Payer: Self-pay

## 2017-04-17 ENCOUNTER — Inpatient Hospital Stay (HOSPITAL_COMMUNITY): Payer: Self-pay | Admitting: Occupational Therapy

## 2017-04-17 ENCOUNTER — Inpatient Hospital Stay (HOSPITAL_COMMUNITY): Payer: Medicare Other

## 2017-04-17 ENCOUNTER — Encounter (HOSPITAL_COMMUNITY): Payer: Self-pay | Admitting: Psychology

## 2017-04-17 LAB — BASIC METABOLIC PANEL
ANION GAP: 8 (ref 5–15)
BUN: 26 mg/dL — ABNORMAL HIGH (ref 6–20)
CALCIUM: 8.6 mg/dL — AB (ref 8.9–10.3)
CO2: 27 mmol/L (ref 22–32)
CREATININE: 1.06 mg/dL — AB (ref 0.44–1.00)
Chloride: 98 mmol/L — ABNORMAL LOW (ref 101–111)
Glucose, Bld: 201 mg/dL — ABNORMAL HIGH (ref 65–99)
Potassium: 4.2 mmol/L (ref 3.5–5.1)
SODIUM: 133 mmol/L — AB (ref 135–145)

## 2017-04-17 LAB — GLUCOSE, CAPILLARY
GLUCOSE-CAPILLARY: 173 mg/dL — AB (ref 65–99)
GLUCOSE-CAPILLARY: 131 mg/dL — AB (ref 65–99)
GLUCOSE-CAPILLARY: 74 mg/dL (ref 65–99)
Glucose-Capillary: 212 mg/dL — ABNORMAL HIGH (ref 65–99)

## 2017-04-17 LAB — CBC WITH DIFFERENTIAL/PLATELET
Basophils Absolute: 0 10*3/uL (ref 0.0–0.1)
Basophils Relative: 0 %
EOS ABS: 0.4 10*3/uL (ref 0.0–0.7)
Eosinophils Relative: 6 %
HCT: 25.4 % — ABNORMAL LOW (ref 36.0–46.0)
Hemoglobin: 7.7 g/dL — ABNORMAL LOW (ref 12.0–15.0)
LYMPHS ABS: 2.4 10*3/uL (ref 0.7–4.0)
LYMPHS PCT: 38 %
MCH: 24.4 pg — AB (ref 26.0–34.0)
MCHC: 30.3 g/dL (ref 30.0–36.0)
MCV: 80.6 fL (ref 78.0–100.0)
MONO ABS: 0.4 10*3/uL (ref 0.1–1.0)
Monocytes Relative: 7 %
Neutro Abs: 3.2 10*3/uL (ref 1.7–7.7)
Neutrophils Relative %: 49 %
Platelets: 264 10*3/uL (ref 150–400)
RBC: 3.15 MIL/uL — AB (ref 3.87–5.11)
RDW: 15.9 % — AB (ref 11.5–15.5)
WBC: 6.4 10*3/uL (ref 4.0–10.5)

## 2017-04-17 MED ORDER — BUMETANIDE 1 MG PO TABS
1.0000 mg | ORAL_TABLET | Freq: Two times a day (BID) | ORAL | Status: DC
  Administered 2017-04-17 – 2017-04-25 (×16): 1 mg via ORAL
  Filled 2017-04-17 (×18): qty 1

## 2017-04-17 NOTE — Consult Note (Signed)
Neuropsychological Consultation   Patient:   [name] [name]   DOB:   [birth year]  MR Number:  [id number]  Location:  [place] [address] [facility] [address] [id number] [place] [address] Dept: [place]: [phone]           Date of Service:   04/17/2017  Start Time:   8 AM End Time:   9 AM  Provider/Observer:  Ilean Skill, Psy.D.       Clinical Neuropsychologist       Billing Code/Service: [phone] 4 Units  Chief Complaint:    [name] [name] is a 41 year old female with history of  bipolar disorder, diabetes mellitus peripheral neuropathy, hypertension, OSA on CPAP, CKD stage III, morbid obesity as well as necrotizing fasciitis of the groin and debridement of left second toe ulcer.  Recent admission for severe sepsis secondary to left lower extremity cellulitis.  Patient underwent transtibial amputation on 04/07/2017 with subsequent referral for comprehensive rehabilitation program.  Patient has had both depressive and other adjustment symptoms and acute psychosocial events following amputation.  Her long-time boyfriend has broken up with her due to what the patient thinks is her amputation.  She does have history of bipolar disorder and has history of becoming agitated and angry as part of her symptoms.    Reason for Service:  [name] [name] was referred for neuropsychological consultation due to frustration, feelings of loss, worry about long-term coping and other adjustment issues.  Below is the HPI for the current admission.    HPI: [name] [name] is a 41 y.o. right handed female with history of bipolar disorder, diabetes mellitus peripheral neuropathy, hypertension, OSA on CPAP, CKD stage III, morbid obesity as well as necrotizing fasciitis of the groin and debridement of left second toe ulcer. Recent admission 03/24/2017- 04/02/2017 for severe sepsis secondary to left lower extremity  cellulitis. Per chart review and patient, patient lives with her disabled mother who has an amputation, uncle and nephew. 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 Presented 04/07/2017 with decreasing ischemic changes of left lower extremity as well as necrotic abscess. Wound was not felt to be salvageable and underwent transtibial amputation 04/07/2017 with placement of wound VAC per Dr. Sharol Given. Hospital course pain management. MRSA PCR screening positive maintained on contact precautions. Physical therapy evaluation completed with recommendations of physical medicine rehabilitation consult. Patient was admitted for a comprehensive rehabilitation program   Current Status:  The patient reports that she has had times with agitation and frustration and worry about how her life will change.  Behavioral Observation: [name] [name]  presents as a 41 y.o.-year-old Right African American Female who appeared her stated age. her dress was Appropriate and she was Well Groomed and her manners were Appropriate to the situation.  her participation was indicative of Appropriate and Attentive behaviors.  There were any physical disabilities noted.  she displayed an appropriate level of cooperation and motivation.     Interactions:    Active Appropriate and Attentive  Attention:   within normal limits and attention span and concentration were age appropriate  Memory:   within normal limits; recent and remote memory intact  Visuo-spatial:  within normal limits  Speech (Volume):  normal  Speech:   normal; normal  Thought Process:  Coherent and Relevant  Though Content:  WNL; not suicidal  Orientation:   person, place, time/date and situation  Judgment:   Fair  Planning:  Fair  Affect:    Depressed and Irritable  Mood:    Depressed and Irritable  Insight:   Fair  Intelligence:   normal  Medical History:   Past Medical History:  Diagnosis Date  . Abnormal Pap smear of cervix 2009  . Abscess     history of multiple abscesses  . Anemia of chronic disease 2002  . Anxiety   . Bipolar disorder ([place])   . Cellulitis 05/21/2014   right eye  . Chronic bronchitis ([place])    "get it q yr" (05/13/2013)  . Chronic pain   . Depression   . Edema of lower extremity   . Endocarditis 2002   subacute bacterial endocarditis.   . Family history of anesthesia complication    "my mom has a hard time coming out from under"  . Fibromyalgia   . GERD (gastroesophageal reflux disease)   . Heart murmur   . History of blood transfusion    "just low blood count" (05/13/2013)  . Hyperlipidemia   . Hypertension   . Hypothyroidism   . Hypothyroidism, adult 03/21/2014  . Necrosis ([place])    and ulceration  . Obesity   . OSA on CPAP    does not wear CPAP  . Peripheral neuropathy   . Type II diabetes mellitus ([place])         Psychiatric History:  Patient has history of bipolar disorder that is more of the agitated state/type  Family Med/Psych History:  Family History  Problem Relation Age of Onset  . Heart failure Mother   . Diabetes Mother   . Kidney disease Mother   . Kidney disease Father   . Diabetes Father   . Diabetes Paternal Grandmother   . Heart failure Paternal Grandmother   . Other Other     Risk of Suicide/Violence: low Patient denies SI or HI  Impression/DX:  [name] Sinclair Marsicano is a 41 year old female with history of  bipolar disorder, diabetes mellitus peripheral neuropathy, hypertension, OSA on CPAP, CKD stage III, morbid obesity as well as necrotizing fasciitis of the groin and debridement of left second toe ulcer.  Recent admission for severe sepsis secondary to left lower extremity cellulitis.  Patient underwent transtibial amputation on 04/07/2017 with subsequent referral for comprehensive rehabilitation program.  Patient has had both depressive and other adjustment symptoms and acute psychosocial events following amputation.  Her long-time boyfriend has broken up with her due  to what the patient thinks is her amputation.  She does have history of bipolar disorder and has history of becoming agitated and angry as part of her symptoms.   The patient reports that she has had times with agitation and frustration and worry about how her life will change. Patient does report that these symptoms are present but that she does not feel they represent the start of a bipolar event but more reaction to current situation.  Disposition/Plan:  Will see the patient again later in week.         Electronically Signed   _______________________ Ilean Skill, Psy.D.

## 2017-04-17 NOTE — Progress Notes (Addendum)
[facility]     PROGRESS NOTE  Subjective/Complaints:  Pt seen laying in bed this AM.  She states she sleep slightly and would like medication to prevent her from waking up when people com in the room.  She is agitates this AM.  Nurse tech notes cough. Pt later complains of LE edema.   ROS: Denies nausea, vomiting, diarrhea, shortness of breath or chest pain   Objective: Vital Signs: Blood pressure (!) 143/82, pulse (!) 102, temperature 97.8 F (36.6 C), temperature source Oral, resp. rate 20, height 5\' 10"  (1.778 m), weight (!) 157.9 kg (348 lb 1.7 oz), last menstrual period 04/06/2017, SpO2 98 %. No results found. Recent Labs    04/17/17 0535  WBC 6.4  HGB 7.7*  HCT 25.4*  PLT 264   Recent Labs    04/17/17 0535  NA 133*  K 4.2  CL 98*  GLUCOSE 201*  BUN 26*  CREATININE 1.06*  CALCIUM 8.6*   CBG (last 3)  Recent Labs    04/16/17 1714 04/16/17 2151 04/17/17 0706  GLUCAP 133* 146* 173*    Wt Readings from Last 3 Encounters:  04/11/17 (!) 157.9 kg (348 lb 1.7 oz)  04/07/17 (!) 159.6 kg (351 lb 13.7 oz)  03/29/17 (!) 159.6 kg (351 lb 13.7 oz)    Physical Exam:  BP (!) 143/82 (BP Location: Right Arm)   Pulse (!) 102   Temp 97.8 F (36.6 C) (Oral)   Resp 20   Ht 5\' 10"  (1.778 m)   Wt (!) 157.9 kg (348 lb 1.7 oz)   LMP 04/06/2017 (Exact Date)   SpO2 98%   BMI 49.95 kg/m  Gen: NAD. Vital signs reviewed. Obese. HENT: Normocephalic. Atraumatic. Eyes: EOM are normal.  no discharge.  Cardiovascular: RRR. No JVD   Respiratory:  CTA Bilaterally. Normal effort   GI: Bowel sounds are normal. She exhibits no distension.  Musculoskeletal: She exhibits edema (b/l LE) and tenderness (LLE).  Neurological: She is alert and oriented.  Motor: B/l UE/RLE 5/5 proximal to distal LLE: HF 4+/5 (pain inhibition) Skin.  Incision clean/dry/intact.   Psychiatric: Agitated.  Assessment/Plan: 1. Functional deficits secondary to left transtibial  amputation 04/07/2017 which require 3+ hours per day of interdisciplinary therapy in a comprehensive inpatient rehab setting. Physiatrist is providing close team supervision and 24 hour management of active medical problems listed below. Physiatrist and rehab team continue to assess barriers to discharge/monitor patient progress toward functional and medical goals.  Function:  Bathing Bathing position   Position: Wheelchair/chair at sink  Bathing parts Body parts bathed by patient: Front perineal area, Right arm, Left arm, Chest, Abdomen, Buttocks, Right upper leg, Left upper leg, Right lower leg Body parts bathed by helper: Back  Bathing assist Assist Level: Touching or steadying assistance(Pt > 75%)      Upper Body Dressing/Undressing Upper body dressing Upper body dressing/undressing activity did not occur: N/A What is the patient wearing?: Hospital gown                Upper body assist        Lower Body Dressing/Undressing Lower body dressing   What is the patient wearing?: Hospital Gown         Non-skid slipper socks- Performed by patient: Don/doff right sock                    Lower body assist Assist for lower body dressing: Touching or steadying assistance (Pt >  75%)      Toileting Toileting   Toileting steps completed by patient: Performs perineal hygiene Toileting steps completed by helper: Adjust clothing prior to toileting, Adjust clothing after toileting Toileting Assistive Devices: Grab bar or rail  Toileting assist Assist level: Touching or steadying assistance (Pt.75%)   Transfers Chair/bed transfer   Chair/bed transfer method: Stand pivot Chair/bed transfer assist level: Supervision or verbal cues Chair/bed transfer assistive device: Armrests, Medical sales representative Ambulation activity did not occur: Safety/medical concerns(medical (low hemoglobin))   Max distance: 10' Assist level: Supervision or verbal cues   Wheelchair    Type: Manual Max wheelchair distance: 150 ft Assist Level: Supervision or verbal cues  Cognition Comprehension Comprehension assist level: Understands basic 90% of the time/cues < 10% of the time  Expression Expression assist level: Expresses basic 90% of the time/requires cueing < 10% of the time.  Social Interaction Social Interaction assist level: Interacts appropriately 90% of the time - Needs monitoring or encouragement for participation or interaction.  Problem Solving Problem solving assist level: Solves basic 90% of the time/requires cueing < 10% of the time  Memory Memory assist level: Recognizes or recalls 90% of the time/requires cueing < 10% of the time    Medical Problem List and Plan:  1. Decreased functional mobility secondary to left transtibial amputation 04/07/2017    Cont CIR   Weekend notes and chart reviewed, history of LE edema 2. DVT Prophylaxis/Anticoagulation: SCD right lower extremity  3. Pain Management: Lyrica 100 mg increased to 3 times daily    oxycodone and Robaxin as needed  4. Mood: Elavil 50 mg daily at bedtime  5. Neuropsych: This patient is capable of making decisions on her own behalf.  6. Skin/Wound Care: Routine skin checks     Changed her wound dressing to an oil immersion dressing with Kerlix and shrinker over top to be changed daily 7. Fluids/Electrolytes/Nutrition: Routine I&O's  8. MRSA PCR screening positive. Contact precautions  9. Diabetes mellitus and peripheral neuropathy. Hemoglobin A1c 9.0. NovoLog 6 units 3 times a day, Lantus insulin 30 units daily at bedtime. Diabetic teaching    Resumed invokana at 100mg  qam (on 300mg  at home)   Remains elevated on 11/26 10. ?CKD stage III.    Creatinine 1.06 on 11/26   Labs ordered for Monday   Continue to monitor 11. Hypertension.    Monitor with increased mobility    Overall controlled on 11/26 12. Constipation. Laxative assistance  13. Hyperlipidemia. Lipitor  14. Morbid obesity   Body  mass index is 49.95 kg/m.   Diet and exercise education   Will cont to encourage weight loss to increase endurance and promote overall health 15.  Hyponatremia   Sodium 133 on 11/26   Labs ordered for tomorrow   Cont to monitor 16. ABLA   Hb 7.7 on 11/26, after transfused   Cont to monitor 17. Peripheral edema   Chronic problem   Lasix increased to 40 mg twice a day on 11/22, changed to bumex on 11/26   CXR ordered on 11/26 18. Sleep disturbance   Trazodone prn    LOS (Days) 6  A FACE TO FACE EVALUATION WAS PERFORMED  Tevis Conger Lorie Phenix 04/17/2017 8:02 AM

## 2017-04-17 NOTE — Progress Notes (Signed)
Patient still refusing to be in safer position. Educated patient. She keeps saying she is not going to fall. Educated patient again about the potential risk of falling and educated about bed alarm. Patient still chooses to refuse and not cooperate. Will continue to monitor.

## 2017-04-17 NOTE — Progress Notes (Signed)
Occupational Therapy Session Note  Patient Details  Name: [name] [name] MRN: [id number] Date of Birth: Sep 22, 1975  Today's Date: 04/17/2017 OT Individual Time: 1100-1206 OT Individual Time Calculation (min): 66 min    Short Term Goals: Week 1:  OT Short Term Goal 1 (Week 1): STGs equal to LTGs based on ELOS  Skilled Therapeutic Interventions/Progress Updates:    Tx focus on adaptive bathing/dressing skills, balance, and activity tolerance.   Pt greeted in w/c. Had just received setup for sponge bath. Pt bathed w/c level at sink sit<stand level with steady assist. Elevated R LE on bed for thorough washing and application of foot cream (Min A for the lateral border of foot). Pt shaving underarms and applying lotion with setup. Assist provided for ACE wrapping Rt LE for edema mgt. Afterwards she wanted to dress. UB/LB dressing completed with overall steady assist during dynamic standing portions with RW. Pt using bed and footstool as adaptive methods for donning Rt gripper sock. Education/practice with leg rest mgt for residual limb with pt able to do this herself by the end of tx. Pt was left with all needs within reach and mother present.   Therapy Documentation Precautions:  Precautions Precautions: Fall Precaution Comments: L BKA, wound vac Restrictions Weight Bearing Restrictions: Yes LLE Weight Bearing: Non weight bearing Other Position/Activity Restrictions: Recent BKA on LLE; wound vac Pain: Pain Assessment Pain Assessment: 0-10 Pain Score: 1  Pain Type: Surgical pain Pain Location: Leg Pain Orientation: Left Pain Frequency: Occasional Pain Onset: Gradual Patients Stated Pain Goal: 1 Pain Intervention(s): Other (Comment)(pt premedicated by secondary RN) ADL:     See Function Navigator for Current Functional Status.   Therapy/Group: Individual Therapy  Eldrick Penick A Yuritza Paulhus 04/17/2017, 12:17 PM

## 2017-04-17 NOTE — Progress Notes (Signed)
Physical Therapy Session Note  Patient Details  Name: [name] [name] MRN: [id number] Date of Birth: April 01, 1976  Today's Date: 04/17/2017 PT Individual Time: 0900-1000, 1331-1444 PT Individual Time Calculation (min): 60 min , 73 min  Short Term Goals: Week 1:  PT Short Term Goal 1 (Week 1): Pt will perform transfers with supervision PT Short Term Goal 2 (Week 1): Pt will initiate gait training PT Short Term Goal 3 (Week 1): Pt will perform sit<>stand with supervision   Skilled Therapeutic Interventions/Progress Updates:    Session 1: Pt seated EOB upon PT arrival with RN, agreeable to therapy tx and pain 2/10 in L LE.  Pt transferred EOB>w/c squat pivot with supervision. Pt propelled w/c from room>gym with supervision using B UEs. Therapist discussed barriers to d/c with patient, including her 2 STE which will be the biggest barrier. Pt insists on "at least trying" and states "I can do it," however therapist explained that from what we have seen so far in therapy the patient has been unsuccessful at even attempting a 2 inch step. Therapist encouraged pt to start thinking of a "back up plan" in case she is unable to ascend/descend steps successfully. Pt states that she does not want a ramp and refuses to be "wheelchair bound like my mother." Pt performed x 5 sit<>stands this session with supervision using RW for UE support. In standing pt worked on hopping in place for height with emphasis on supporting body weight through UEs. Pt unable to support weight through UE's using RW and only able to clear about 1 inch from the floor. Remainder of the session focused on UE functional strengthening, pt performed 4 sets of 8: shoulder press with 7# weights and tricep overhead extensions with 4# weights. Pt propelled back to room with supervision and left seated in w/c with needs in reach.   Session 2: Pt seated in w/c upon PT arrival, agreeable to therapy tx and reports pain 5/10 in L LE. Pt propelled  w/c from room<>gym x 150 ft each direction with supervision using B UEs. Pt standing with RW performed 2 x 10 hip extension with L LE. Pt transferred from w/c<>mat squat pivot with supervision. Pt transferred sitting>supine with supervision. In supine pt performed 4 x 8 chest press with 8# and 2 x 10 SLR with B LEs. Pt transferred from supine>prone with supervision. In prone worked on hip flexor stretch, 2 x 10 hip extension with L LE, and partial push ups x 10 for strengthening. Pt still very eager to perform steps independently when she goes home, therapist explained the challenges with that. Pt worked on hopping on/off 2 inch step in the parallel bars. Pt performed x 2 hops on 2 inch step with min assist from therapist and x 1 with supervision, verbal cues for techniques. Pt propelled w/c back to room and left seated in w/c with needs in reach.   Therapy Documentation Precautions:  Precautions Precautions: Fall Precaution Comments: L BKA, wound vac Restrictions Weight Bearing Restrictions: Yes LLE Weight Bearing: Non weight bearing Other Position/Activity Restrictions: Recent BKA on LLE; wound vac   See Function Navigator for Current Functional Status.   Therapy/Group: Individual Therapy  Netta Corrigan, PT, DPT 04/17/2017, 7:50 AM

## 2017-04-18 ENCOUNTER — Inpatient Hospital Stay (HOSPITAL_COMMUNITY): Payer: Self-pay | Admitting: Occupational Therapy

## 2017-04-18 ENCOUNTER — Inpatient Hospital Stay (HOSPITAL_COMMUNITY): Payer: Self-pay | Admitting: Physical Therapy

## 2017-04-18 ENCOUNTER — Inpatient Hospital Stay (HOSPITAL_COMMUNITY): Payer: Self-pay

## 2017-04-18 LAB — GLUCOSE, CAPILLARY
GLUCOSE-CAPILLARY: 145 mg/dL — AB (ref 65–99)
Glucose-Capillary: 186 mg/dL — ABNORMAL HIGH (ref 65–99)
Glucose-Capillary: 176 mg/dL — ABNORMAL HIGH (ref 65–99)
Glucose-Capillary: 222 mg/dL — ABNORMAL HIGH (ref 65–99)

## 2017-04-18 NOTE — Progress Notes (Signed)
Physical Therapy Session Note  Patient Details  Name: [name] [name] MRN: [id number] Date of Birth: 1975/10/19  Today's Date: 04/18/2017 PT Individual Time: 1101-1200 PT Individual Time Calculation (min): 59 min   Short Term Goals: Week 1:  PT Short Term Goal 1 (Week 1): Pt will perform transfers with supervision PT Short Term Goal 2 (Week 1): Pt will initiate gait training PT Short Term Goal 3 (Week 1): Pt will perform sit<>stand with supervision   Skilled Therapeutic Interventions/Progress Updates:    Pt seated on toilet in bathroom upon PT arrival, agreeable to therapy tx and reports pain 7/10 and just received pain meds. Pt transferred from toilet>w/c squat pivot with supervision. Pt propelled w/c within room using B UEs to the sink in order to wash hands. Pt propelled w/c from room<>gym x 150 ft each direction with supervision using B UEs. Pt performed seated therex for upper and lower body strengthening: 4 x 8 shoulder press 8#, 3 x 10 L LE LAQ, 4 x 8 shoulder flexion 4#, 2 x 5 sit<>stands, 4 x 8 shoulder abduction with 4#, x 4 w/c push ups with 3-5 sec hold. Pt propelled w/c back to room and left seated in w/c with needs in reach.   Therapy Documentation Precautions:  Precautions Precautions: Fall Precaution Comments: L BKA, wound vac Restrictions Weight Bearing Restrictions: Yes LLE Weight Bearing: Non weight bearing Other Position/Activity Restrictions: Recent BKA on LLE; wound vac   See Function Navigator for Current Functional Status.   Therapy/Group: Individual Therapy  Netta Corrigan, PT, DPT 04/18/2017, 7:55 AM

## 2017-04-18 NOTE — Progress Notes (Signed)
Dressing changed no noted drainage.

## 2017-04-18 NOTE — Progress Notes (Signed)
[facility] MEDICINE & REHABILITATION     PROGRESS NOTE  Subjective/Complaints:  Pt seen sitting up in her chair this AM.  She has questions abous her diabetes medications as well as her edema and chest x-ray results  ROS: Denies nausea, vomiting, diarrhea, shortness of breath or chest pain   Objective: Vital Signs: Blood pressure 134/69, pulse (!) 101, temperature 98.8 F (37.1 C), temperature source Oral, resp. rate 20, height 5\' 10"  (1.778 m), weight (!) 157.9 kg (348 lb 1.7 oz), last menstrual period 04/06/2017, SpO2 100 %. Dg Chest 2 View  Result Date: 04/17/2017 CLINICAL DATA:  41 y/o F; lower extremity edema. History of chronic bronchitis. EXAM: CHEST  2 VIEW COMPARISON:  03/24/2017 chest radiograph. FINDINGS: Stable borderline cardiomegaly. Clear lungs. No pleural effusion or pneumothorax. Mild multilevel degenerative changes of the spine. IMPRESSION: Stable borderline cardiomegaly. No acute pulmonary process identified. Electronically Signed   By: Kristine Garbe M.D.   On: 04/17/2017 21:51   Recent Labs    04/17/17 0535  WBC 6.4  HGB 7.7*  HCT 25.4*  PLT 264   Recent Labs    04/17/17 0535  NA 133*  K 4.2  CL 98*  GLUCOSE 201*  BUN 26*  CREATININE 1.06*  CALCIUM 8.6*   CBG (last 3)  Recent Labs    04/17/17 1631 04/17/17 2059 04/18/17 0640  GLUCAP 74 212* 186*    Wt Readings from Last 3 Encounters:  04/11/17 (!) 157.9 kg (348 lb 1.7 oz)  04/07/17 (!) 159.6 kg (351 lb 13.7 oz)  03/29/17 (!) 159.6 kg (351 lb 13.7 oz)    Physical Exam:  BP 134/69 (BP Location: Right Arm)   Pulse (!) 101   Temp 98.8 F (37.1 C) (Oral)   Resp 20   Ht 5\' 10"  (1.778 m)   Wt (!) 157.9 kg (348 lb 1.7 oz)   LMP 04/06/2017 (Exact Date)   SpO2 100%   BMI 49.95 kg/m  Gen: NAD. Vital signs reviewed. Obese. HENT: Normocephalic. Atraumatic. Eyes: EOM are normal.  no discharge.  Cardiovascular: RRR. No JVD   Respiratory:  CTA Bilaterally. Normal effort   GI:  Bowel sounds are normal. She exhibits no distension.  Musculoskeletal: She exhibits edema (b/l LE) and tenderness (LLE).  Neurological: She is alert and oriented.  Motor: B/l UE/RLE 5/5 proximal to distal LLE: HF 4+/5 (pain inhibition, stable) Skin.  Incision clean/dry/intact.   Psychiatric: Flat  Assessment/Plan: 1. Functional deficits secondary to left transtibial amputation 04/07/2017 which require 3+ hours per day of interdisciplinary therapy in a comprehensive inpatient rehab setting. Physiatrist is providing close team supervision and 24 hour management of active medical problems listed below. Physiatrist and rehab team continue to assess barriers to discharge/monitor patient progress toward functional and medical goals.  Function:  Bathing Bathing position   Position: Wheelchair/chair at sink  Bathing parts Body parts bathed by patient: Front perineal area, Right arm, Left arm, Chest, Abdomen, Buttocks, Right upper leg, Left upper leg, Right lower leg Body parts bathed by helper: Back  Bathing assist Assist Level: Touching or steadying assistance(Pt > 75%)      Upper Body Dressing/Undressing Upper body dressing Upper body dressing/undressing activity did not occur: N/A What is the patient wearing?: Pull over shirt/dress     Pull over shirt/dress - Perfomed by patient: Thread/unthread right sleeve, Thread/unthread left sleeve, Put head through opening, Pull shirt over trunk          Upper body assist Assist Level: Set  up      Lower Body Dressing/Undressing Lower body dressing   What is the patient wearing?: Pants, Non-skid slipper socks     Pants- Performed by patient: Thread/unthread right pants leg, Thread/unthread left pants leg, Pull pants up/down   Non-skid slipper socks- Performed by patient: Don/doff right sock Non-skid slipper socks- Performed by helper: Don/doff right sock                  Lower body assist Assist for lower body dressing: Touching  or steadying assistance (Pt > 75%)      Toileting Toileting   Toileting steps completed by patient: Adjust clothing prior to toileting, Performs perineal hygiene, Adjust clothing after toileting Toileting steps completed by helper: Adjust clothing prior to toileting, Adjust clothing after toileting Toileting Assistive Devices: Grab bar or rail  Toileting assist Assist level: Touching or steadying assistance (Pt.75%)   Transfers Chair/bed transfer   Chair/bed transfer method: Stand pivot, Squat pivot Chair/bed transfer assist level: Supervision or verbal cues Chair/bed transfer assistive device: Armrests, Medical sales representative Ambulation activity did not occur: Safety/medical concerns(medical (low hemoglobin))   Max distance: 10' Assist level: Supervision or verbal cues   Wheelchair   Type: Manual Max wheelchair distance: 150 ft Assist Level: Supervision or verbal cues  Cognition Comprehension Comprehension assist level: Understands basic 90% of the time/cues < 10% of the time  Expression Expression assist level: Expresses basic 90% of the time/requires cueing < 10% of the time.  Social Interaction Social Interaction assist level: Interacts appropriately 90% of the time - Needs monitoring or encouragement for participation or interaction.  Problem Solving Problem solving assist level: Solves basic 90% of the time/requires cueing < 10% of the time  Memory Memory assist level: Recognizes or recalls 90% of the time/requires cueing < 10% of the time    Medical Problem List and Plan:  1. Decreased functional mobility secondary to left transtibial amputation 04/07/2017    Cont CIR 2. DVT Prophylaxis/Anticoagulation: SCD right lower extremity  3. Pain Management: Lyrica 100 mg increased to 3 times daily    oxycodone and Robaxin as needed  4. Mood: Elavil 50 mg daily at bedtime  5. Neuropsych: This patient is capable of making decisions on her own behalf.    Appreciate  Neuropsych follow up- will need to monitor for exacerbation of bipolar symptoms 6. Skin/Wound Care: Routine skin checks     Changed her wound dressing to an oil immersion dressing with Kerlix and shrinker over top to be changed daily 7. Fluids/Electrolytes/Nutrition: Routine I&O's  8. MRSA PCR screening positive. Contact precautions  9. Diabetes mellitus and peripheral neuropathy. Hemoglobin A1c 9.0. NovoLog 6 units 3 times a day, Lantus insulin 30 units daily at bedtime. Diabetic teaching    Resumed invokana at 100mg  qam (on 300mg  at home)   Labile, will monitor for trend 10. ?CKD stage III.    Creatinine 1.06 on 11/26   Continue to monitor 11. Hypertension.    Monitor with increased mobility    Overall controlled on 11/27 12. Constipation. Laxative assistance  13. Hyperlipidemia. Lipitor  14. Morbid obesity   Body mass index is 49.95 kg/m.   Diet and exercise education   Will cont to encourage weight loss to increase endurance and promote overall health 15.  Hyponatremia   Sodium 133 on 11/26   Cont to monitor 16. ABLA   Hb 7.7 on 11/26, after transfused   Labs ordered for tomorrow   Cont  to monitor 17. Peripheral edema   Chronic problem   Lasix increased to 40 mg twice a day on 11/22, changed to bumex on 11/26   CXR reviewed suggesting stable cardiomegaly   BNP also ordered for tomorrow 18. Sleep disturbance   Trazodone prn    LOS (Days) 7  A FACE TO FACE EVALUATION WAS PERFORMED  Ankit Lorie Phenix 04/18/2017 8:20 AM

## 2017-04-18 NOTE — Progress Notes (Signed)
Physical Therapy Session Note  Patient Details  Name: [name] [name] MRN: [id number] Date of Birth: 07/17/1975  Today's Date: 04/18/2017 PT Individual Time: 1400-1510 PT Individual Time Calculation (min): 70 min   Short Term Goals: Week 1:  PT Short Term Goal 1 (Week 1): Pt will perform transfers with supervision PT Short Term Goal 2 (Week 1): Pt will initiate gait training PT Short Term Goal 3 (Week 1): Pt will perform sit<>stand with supervision   Skilled Therapeutic Interventions/Progress Updates:   Pt in w/c upon arrival and agreeable to therapy, c/o soreness and discomfort in BLEs but tolerable. Pt self-propelled w/c to/from therapy gym w/ supervision using BUEs. Encouraged pt to perform w/c functions w/ leg rests independently in preparation for discharge. Worked on stepping in gym w/ emphasis on UE support and strength on RW to take more controlled steps as pt is refusing to build a ramp to get into house. Pt noticeably more frustrated w/ functional status this session. Discussed realistic expectations regarding mobility at discharge considering current global strength level and pt's weight. She states she can just crawl up stairs, educated pt on strength required to stand back up, especially w/ one leg. She verbalized understanding of this and did agree that getting a ramp would be the safest way of getting in and out of the home w/ 2 STE to enter. Pt plans to contact landlord regarding approval to build a ramp as she would not be able to financially pay for a ramp rental. Additionally worked on endurance w/ w/c mobility and negotiating ramp. Pt able to self-propel w/c up/down incline in hospital hallway w/ supervision, will attempt ramp in gym at future sessions. Discussed importance of having adequate UE strength to negotiated uneven surfaces and inclined surfaces such as parking lots, pt verbalized understanding and in agreement. Returned to room and ended session in w/c, call bell  within reach and all needs met.   Therapy Documentation Precautions:  Precautions Precautions: Fall Precaution Comments: L BKA, wound vac Restrictions Weight Bearing Restrictions: Yes LLE Weight Bearing: Non weight bearing Other Position/Activity Restrictions: Recent BKA on LLE; wound vac  See Function Navigator for Current Functional Status.   Therapy/Group: Individual Therapy  [name] 04/18/2017, 3:11 PM

## 2017-04-18 NOTE — Progress Notes (Signed)
Social Work Patient ID: [name] [name], female   DOB: 1976-04-17, 41 y.o.   MRN: [id number]  Pt to ask landlord for permission for a ramp to be placed. PT's do not feel she will able to hop up two steps into home. Will work on this barrier to discharge.

## 2017-04-18 NOTE — Progress Notes (Signed)
Occupational Therapy Session Note  Patient Details  Name: [name] [name] MRN: [id number] Date of Birth: 05-22-1976  Today's Date: 04/18/2017 OT Individual Time: 0930-1030 OT Individual Time Calculation (min): 60 min   Skilled Therapeutic Interventions/Progress Updates:    1:1 Perform short distance functional ambulation ~ 5 feet distance to get into her bathroom to the toilte/ shower and from her bedroom door to her bed with RW with min A at slow speed. Recommended wearing a shoe for better foot support. Min A to don. Discussed how her tub bench would not fit into her tub (too small) discussed other options.  Pt declined a shower today reported she perform all bathing and dressing (with hospital gown) at sink level mod I from seated position. Perform bed mobility from simulated very high mat table with supervision from w/c.  Got into prone position for stretching of hip flexors- found blister on back of left knee crease alerted RN. Also kineostaped right calf and thigh for tightness and address pain from swelling. Pt propelled back to room mod I an A with propping up LE at end of session.   Therapy Documentation Precautions:  Precautions Precautions: Fall Precaution Comments: L BKA, wound vac Restrictions Weight Bearing Restrictions: Yes LLE Weight Bearing: Non weight bearing Other Position/Activity Restrictions: Recent BKA on LLE; wound vac Pain: Soreness in right foot calf and back of thigh Other Treatments:    See Function Navigator for Current Functional Status.   Therapy/Group: Individual Therapy  Willeen Cass [facility] 04/18/2017, 3:48 PM

## 2017-04-19 ENCOUNTER — Inpatient Hospital Stay (HOSPITAL_COMMUNITY): Payer: Medicare Other | Admitting: Occupational Therapy

## 2017-04-19 ENCOUNTER — Other Ambulatory Visit: Payer: Self-pay

## 2017-04-19 ENCOUNTER — Inpatient Hospital Stay (HOSPITAL_COMMUNITY): Payer: Self-pay

## 2017-04-19 ENCOUNTER — Encounter (HOSPITAL_COMMUNITY): Payer: Self-pay | Admitting: Psychology

## 2017-04-19 LAB — CBC WITH DIFFERENTIAL/PLATELET
Basophils Absolute: 0 10*3/uL (ref 0.0–0.1)
Basophils Relative: 0 %
Eosinophils Absolute: 0.2 10*3/uL (ref 0.0–0.7)
Eosinophils Relative: 4 %
HEMATOCRIT: 23.5 % — AB (ref 36.0–46.0)
Hemoglobin: 7 g/dL — ABNORMAL LOW (ref 12.0–15.0)
LYMPHS PCT: 36 %
Lymphs Abs: 2 10*3/uL (ref 0.7–4.0)
MCH: 24.3 pg — AB (ref 26.0–34.0)
MCHC: 29.8 g/dL — AB (ref 30.0–36.0)
MCV: 81.6 fL (ref 78.0–100.0)
MONO ABS: 0.6 10*3/uL (ref 0.1–1.0)
MONOS PCT: 10 %
NEUTROS ABS: 2.7 10*3/uL (ref 1.7–7.7)
Neutrophils Relative %: 50 %
Platelets: 238 10*3/uL (ref 150–400)
RBC: 2.88 MIL/uL — ABNORMAL LOW (ref 3.87–5.11)
RDW: 16.5 % — AB (ref 11.5–15.5)
WBC: 5.4 10*3/uL (ref 4.0–10.5)

## 2017-04-19 LAB — GLUCOSE, CAPILLARY
Glucose-Capillary: 211 mg/dL — ABNORMAL HIGH (ref 65–99)
Glucose-Capillary: 159 mg/dL — ABNORMAL HIGH (ref 65–99)
Glucose-Capillary: 119 mg/dL — ABNORMAL HIGH (ref 65–99)
Glucose-Capillary: 143 mg/dL — ABNORMAL HIGH (ref 65–99)

## 2017-04-19 LAB — BRAIN NATRIURETIC PEPTIDE: B Natriuretic Peptide: 63.5 pg/mL (ref 0.0–100.0)

## 2017-04-19 NOTE — Progress Notes (Signed)
Occupational Therapy Session Note  Patient Details  Name: [name] [name] MRN: [id number] Date of Birth: 10/25/1975  Today's Date: 04/19/2017 OT Individual Time: 1829-9371 OT Individual Time Calculation (min): 59 min    Short Term Goals: Week 1:  OT Short Term Goal 1 (Week 1): STGs equal to LTGs based on ELOS  Skilled Therapeutic Interventions/Progress Updates:    Pt completed shower and dressing during session.  Min assist for stand pivot transfers throughout session to the wheelchair, to and from the shower bench, and to and from the toilet with use of the RW for support.  She needed min assist for washing peri area standing in the shower secondary to not having enough room.  She was able to complete all dressing with min guard assist sit to stand except for donning shrinker sock over the LLE, which required mod assist from therapist secondary to gauze on the wound.  Pt left in wheelchair at end of session to finish grooming tasks.    Therapy Documentation Precautions:  Precautions Precautions: Fall Precaution Comments: L BKA, wound vac Restrictions Weight Bearing Restrictions: Yes LLE Weight Bearing: Non weight bearing Other Position/Activity Restrictions: Recent BKA on LLE; wound vac   Pain: Pain Assessment Pain Assessment: Faces Faces Pain Scale: Hurts little more Pain Location: Leg Pain Orientation: Left Pain Descriptors / Indicators: Discomfort Pain Onset: With Activity Pain Intervention(s): Repositioned ADL: See Function Navigator for Current Functional Status.   Therapy/Group: Individual Therapy  Druscilla Petsch OTR/L 04/19/2017, 12:20 PM

## 2017-04-19 NOTE — Progress Notes (Signed)
[name] [name] PHYSICAL MEDICINE & REHABILITATION     PROGRESS NOTE  Subjective/Complaints:  Pt seen sitting at EOB this AM.  She slept well overnight.  She states her edema is not resolved yet.  She also has complaints about her dressing changes.    ROS: Denies nausea, vomiting, diarrhea, shortness of breath or chest pain   Objective: Vital Signs: Blood pressure 123/66, pulse 79, temperature 97.8 F (36.6 C), temperature source Oral, resp. rate 20, height 5\' 10"  (1.778 m), weight (!) 157.9 kg (348 lb 1.7 oz), last menstrual period 04/06/2017, SpO2 96 %. Dg Chest 2 View  Result Date: 04/17/2017 CLINICAL DATA:  41 y/o F; lower extremity edema. History of chronic bronchitis. EXAM: CHEST  2 VIEW COMPARISON:  03/24/2017 chest radiograph. FINDINGS: Stable borderline cardiomegaly. Clear lungs. No pleural effusion or pneumothorax. Mild multilevel degenerative changes of the spine. IMPRESSION: Stable borderline cardiomegaly. No acute pulmonary process identified. Electronically Signed   By: Kristine Garbe M.D.   On: 04/17/2017 21:51   Recent Labs    04/17/17 0535 04/19/17 0523  WBC 6.4 5.4  HGB 7.7* 7.0*  HCT 25.4* 23.5*  PLT 264 238   Recent Labs    04/17/17 0535  NA 133*  K 4.2  CL 98*  GLUCOSE 201*  BUN 26*  CREATININE 1.06*  CALCIUM 8.6*   CBG (last 3)  Recent Labs    04/18/17 1716 04/18/17 2007 04/19/17 0717  GLUCAP 145* 222* 211*    Wt Readings from Last 3 Encounters:  04/11/17 (!) 157.9 kg (348 lb 1.7 oz)  04/07/17 (!) 159.6 kg (351 lb 13.7 oz)  03/29/17 (!) 159.6 kg (351 lb 13.7 oz)    Physical Exam:  BP 123/66 (BP Location: Right Arm)   Pulse 79   Temp 97.8 F (36.6 C) (Oral)   Resp 20   Ht 5\' 10"  (1.778 m)   Wt (!) 157.9 kg (348 lb 1.7 oz)   LMP 04/06/2017 (Exact Date)   SpO2 96%   BMI 49.95 kg/m  Gen: NAD. Vital signs reviewed. Obese. HENT: Normocephalic. Atraumatic. Eyes: EOM are normal.  no discharge.  Cardiovascular: RRR. No JVD    Respiratory:  CTA Bilaterally. Normal effort   GI: Bowel sounds are normal. She exhibits no distension.  Musculoskeletal: She exhibits edema (b/l LE) and tenderness (LLE).  Neurological: She is alert and oriented.  Motor: B/l UE/RLE 5/5 proximal to distal LLE: HF 4+/5 (pain inhibition, unchanged) Skin.  Incision clean/dry/intact.   Psychiatric: Flat  Assessment/Plan: 1. Functional deficits secondary to left transtibial amputation 04/07/2017 which require 3+ hours per day of interdisciplinary therapy in a comprehensive inpatient rehab setting. Physiatrist is providing close team supervision and 24 hour management of active medical problems listed below. Physiatrist and rehab team continue to assess barriers to discharge/monitor patient progress toward functional and medical goals.  Function:  Bathing Bathing position   Position: Wheelchair/chair at sink  Bathing parts Body parts bathed by patient: Front perineal area, Right arm, Left arm, Chest, Abdomen, Buttocks, Right upper leg, Left upper leg, Right lower leg Body parts bathed by helper: Back  Bathing assist Assist Level: Touching or steadying assistance(Pt > 75%)      Upper Body Dressing/Undressing Upper body dressing Upper body dressing/undressing activity did not occur: N/A What is the patient wearing?: Pull over shirt/dress     Pull over shirt/dress - Perfomed by patient: Thread/unthread right sleeve, Thread/unthread left sleeve, Put head through opening, Pull shirt over trunk  Upper body assist Assist Level: Set up      Lower Body Dressing/Undressing Lower body dressing   What is the patient wearing?: Pants, Non-skid slipper socks     Pants- Performed by patient: Thread/unthread right pants leg, Thread/unthread left pants leg, Pull pants up/down   Non-skid slipper socks- Performed by patient: Don/doff right sock Non-skid slipper socks- Performed by helper: Don/doff right sock                   Lower body assist Assist for lower body dressing: Touching or steadying assistance (Pt > 75%)      Toileting Toileting   Toileting steps completed by patient: Adjust clothing prior to toileting, Performs perineal hygiene, Adjust clothing after toileting Toileting steps completed by helper: Adjust clothing prior to toileting, Adjust clothing after toileting Toileting Assistive Devices: Grab bar or rail  Toileting assist Assist level: Touching or steadying assistance (Pt.75%)   Transfers Chair/bed transfer   Chair/bed transfer method: Squat pivot Chair/bed transfer assist level: Supervision or verbal cues Chair/bed transfer assistive device: Armrests, Medical sales representative Ambulation activity did not occur: Safety/medical concerns(medical (low hemoglobin))   Max distance: 10' Assist level: Supervision or verbal cues   Wheelchair   Type: Manual Max wheelchair distance: >150' Assist Level: Supervision or verbal cues  Cognition Comprehension Comprehension assist level: Understands basic 90% of the time/cues < 10% of the time  Expression Expression assist level: Expresses basic 90% of the time/requires cueing < 10% of the time.  Social Interaction Social Interaction assist level: Interacts appropriately 90% of the time - Needs monitoring or encouragement for participation or interaction.  Problem Solving Problem solving assist level: Solves basic 90% of the time/requires cueing < 10% of the time  Memory Memory assist level: Recognizes or recalls 90% of the time/requires cueing < 10% of the time    Medical Problem List and Plan:  1. Decreased functional mobility secondary to left transtibial amputation 04/07/2017    Cont CIR 2. DVT Prophylaxis/Anticoagulation: SCD right lower extremity  3. Pain Management: Lyrica 100 mg increased to 3 times daily    oxycodone and Robaxin as needed  4. Mood: Elavil 50 mg daily at bedtime  5. Neuropsych: This patient is capable of making  decisions on her own behalf.    Appreciate Neuropsych follow up- will need to monitor for exacerbation of bipolar symptoms 6. Skin/Wound Care: Routine skin checks     Changed her wound dressing to an oil immersion dressing with Kerlix and shrinker over top to be changed daily 7. Fluids/Electrolytes/Nutrition: Routine I&O's  8. MRSA PCR screening positive. Contact precautions  9. Diabetes mellitus and peripheral neuropathy. Hemoglobin A1c 9.0. NovoLog 6 units 3 times a day, Lantus insulin 30 units daily at bedtime. Diabetic teaching    Resumed invokana at 100mg  qam (on 300mg  at home)   ?Trending up, will consider further adjustments tomorrow 10. ?CKD stage III.    Creatinine 1.06 on 11/26   Labs ordered for tomorrow   Continue to monitor 11. Hypertension.    Monitor with increased mobility    Overall controlled on 11/28 12. Constipation. Laxative assistance  13. Hyperlipidemia. Lipitor  14. Morbid obesity   Body mass index is 49.95 kg/m.   Diet and exercise education   Will cont to encourage weight loss to increase endurance and promote overall health 15.  Hyponatremia   Sodium 133 on 11/26   Labs ordered for tomorrow   Cont to monitor 16. ABLA  Hb 7.0 on 11/28, after transfused   Labs ordered for tomorrow   Hemoccult stools   Cont to monitor 17. Peripheral edema   Chronic problem   Lasix increased to 40 mg twice a day on 11/22, changed to bumex on 11/26   CXR reviewed suggesting stable cardiomegaly   BNP WNL   Extremely poor monitoring of I/Os, will discuss with nursing again [facility]   04/11/17 1834  Weight: (!) 157.9 kg (348 lb 1.7 oz)  18. Sleep disturbance   Trazodone prn    LOS (Days) 8  A FACE TO FACE EVALUATION WAS PERFORMED  Willia Lampert Lorie Phenix 04/19/2017 8:18 AM

## 2017-04-19 NOTE — Progress Notes (Signed)
Physical Therapy Session Note  Patient Details  Name: [name] [name] MRN: [id number] Date of Birth: 13-May-1976  Today's Date: 04/19/2017 PT Individual Time: 1002-1100, 1300-1359 PT Individual Time Calculation (min): 58 min , 59 min  Short Term Goals: Week 1:  PT Short Term Goal 1 (Week 1): Pt will perform transfers with supervision PT Short Term Goal 2 (Week 1): Pt will initiate gait training PT Short Term Goal 3 (Week 1): Pt will perform sit<>stand with supervision   Skilled Therapeutic Interventions/Progress Updates:    Session 1: Pt seated in w/c upon PT arrival, agreeable to therapy tx and reports pain 2/10 in L LE. Pt propelled w/c from room>dayroom x250 ft with supervision, using B UEs working on endurance and functional mobility. Pt used UE ergometer x 8 minutes on level 4.0 alternating 30 seconds high intensity and 30 seconds low intensity, working on cardiovascular endurance. Pt worked on ambulation x 5 ft, x 12 ft and x 6 ft using RW and with supervision. Pt propelled w/c from dayroom>gym with supervision. Pt worked on LE strengthening: LAQ with L LE 2 x 10 and R LE with 5# 2 x 10. Pt propelled w/c back to room and left seated in w/c with supervision.   Session 2: Pt seated in w/c upon PT arrival, agreeable to therapy tx and reports 2/10 pain. Pt requesting to use the bathroom, pt performed squat pivot transfers from w/c<>toilet with min assist and verbal cues for set up. Pt propelled w/c to sink to wash hands and propelled w/c>gym with supervision. Seated in w/c pt performed UE/LE therex for strengthening: 2 x 10 UE diagonals with green TB, 2 x 10 R ankle PF with blue TB, 2 x 10 UE rows with blue TB, L hamstring curls with green TB, B hip abduction with blue TB. Pt practiced squat pivot transfers from w/c<>mat x 2 with supervision and verbal cues for techniques. Pt propelled w/c back to room with supervision, left seated in w/c with needs in reach.   Therapy  Documentation Precautions:  Precautions Precautions: Fall Precaution Comments: L BKA, wound vac Restrictions Weight Bearing Restrictions: Yes LLE Weight Bearing: Non weight bearing Other Position/Activity Restrictions: Recent BKA on LLE; wound vac   See Function Navigator for Current Functional Status.   Therapy/Group: Individual Therapy  Netta Corrigan, PT, DPT 04/19/2017, 10:46 AM

## 2017-04-19 NOTE — Progress Notes (Signed)
Physical Therapy Weekly Progress Note  Patient Details  Name: [name] [name] MRN: [id number] Date of Birth: [birth year]  Beginning of progress report period: April 12, 2017 End of progress report period: April 19, 2017   Patient has met 3 of 3 short term goals. Pt is progressing with all functional mobility but continues to be limited by endurance and weight. Pt is able to ambulate about 5-10 ft consistently using RW and hopping method with R LE but fatigues very quickly. The patient has steps to enter her home and will likely be unable to clear a full sized step with hopping secondary to UE weakness, obesity and LE weakness. Pt propels w/c with supervision throughout unit and performs transfers with supervision.   Patient continues to demonstrate the following deficits muscle weakness and muscle joint tightness, decreased cardiorespiratoy endurance and decreased standing balance and decreased balance strategies and therefore will continue to benefit from skilled PT intervention to increase functional independence with mobility.  Patient progressing toward long term goals..  Continue plan of care.  PT Short Term Goals Week 1:  PT Short Term Goal 1 (Week 1): Pt will perform transfers with supervision PT Short Term Goal 1 - Progress (Week 1): Met PT Short Term Goal 2 (Week 1): Pt will initiate gait training PT Short Term Goal 2 - Progress (Week 1): Met PT Short Term Goal 3 (Week 1): Pt will perform sit<>stand with supervision  PT Short Term Goal 3 - Progress (Week 1): Met Week 2:  PT Short Term Goal 1 (Week 2): STG=LTG due to ELOS  Skilled Therapeutic Interventions/Progress Updates:  Ambulation/gait training;Discharge planning;Functional mobility training;Therapeutic Activities;Psychosocial support;Balance/vestibular training;Disease management/prevention;Neuromuscular re-education;Skin care/wound management;Therapeutic Exercise;Wheelchair propulsion/positioning;Cognitive  remediation/compensation;DME/adaptive equipment instruction;Pain management;Splinting/orthotics;UE/LE Strength taining/ROM;Community reintegration;Functional electrical stimulation;Patient/family education;Stair training;UE/LE Coordination activities   Therapy Documentation Precautions:  Precautions Precautions: Fall Precaution Comments: L BKA, wound vac Restrictions Weight Bearing Restrictions: Yes LLE Weight Bearing: Non weight bearing Other Position/Activity Restrictions: Recent BKA on LLE; wound vac   See Function Navigator for Current Functional Status.  Therapy/Group: Individual Therapy  Netta Corrigan, PT, DPT 04/19/2017, 12:56 PM

## 2017-04-19 NOTE — Progress Notes (Signed)
Occupational Therapy Session Note  Patient Details  Name: [name] [name] MRN: [id number] Date of Birth: 1976/01/19  Today's Date: 04/19/2017 OT Individual Time: 3143-8887 OT Individual Time Calculation (min): 32 min    Short Term Goals: Week 1:  OT Short Term Goal 1 (Week 1): STGs equal to LTGs based on ELOS  Skilled Therapeutic Interventions/Progress Updates:    Pt completed wheelchair mobility down to the tub/shower room.  She was able to transfer with min assist with use of the RW for support.  Pt with increased anxiousness with discussion of shower tub setup and needed to clear out throw rugs.  Pt currently has a tub shower bench at home but reports they have not been able to get it to fit.  Feel HHOT can further evaluate this and make recommendations.  Returned to wheelchair after practicing transfer and then pt propelled back to the room with modified independence.    Therapy Documentation Precautions:  Precautions Precautions: Fall Precaution Comments: L BKA, wound vac Restrictions Weight Bearing Restrictions: Yes LLE Weight Bearing: Non weight bearing Other Position/Activity Restrictions: Recent BKA on LLE; wound vac  Pain: Pain Assessment Pain Assessment: Faces Faces Pain Scale: Hurts little more Pain Type: Acute pain Pain Location: Leg Pain Orientation: Left Pain Descriptors / Indicators: Discomfort;Grimacing Pain Onset: With Activity Pain Intervention(s): Repositioned ADL: See Function Navigator for Current Functional Status.   Therapy/Group: Individual Therapy  Jena Tegeler OTR/L 04/19/2017, 4:09 PM

## 2017-04-19 NOTE — Plan of Care (Signed)
  RH SKIN INTEGRITY RH STG SKIN FREE OF INFECTION/BREAKDOWN Description Min assist  04/19/2017 0005 - Progressing by Blinda Leatherwood, RN   RH SAFETY RH STG ADHERE TO SAFETY PRECAUTIONS W/ASSISTANCE/DEVICE Description STG Adhere to Safety Precautions With min Assistance/Device.  04/19/2017 0005 - Progressing by Blinda Leatherwood, RN   RH PAIN MANAGEMENT RH STG PAIN MANAGED AT OR BELOW PT'S PAIN GOAL Description 4 or less  04/19/2017 0005 - Progressing by Blinda Leatherwood, RN

## 2017-04-19 NOTE — Plan of Care (Signed)
  RH SKIN INTEGRITY RH STG SKIN FREE OF INFECTION/BREAKDOWN Description Min assist  04/19/2017 2347 - Progressing by Blinda Leatherwood, RN   RH SKIN INTEGRITY RH STG ABLE TO PERFORM INCISION/WOUND CARE W/ASSISTANCE Description STG Able To Perform Incision/Wound Care With max Assistance.  04/19/2017 2347 - Progressing by Lysbeth Penner D, RN Continuous assessment and dressing changes. No noted infection RH SAFETY RH STG ADHERE TO SAFETY PRECAUTIONS W/ASSISTANCE/DEVICE Description STG Adhere to Safety Precautions With min Assistance/Device.  04/19/2017 2347 - Progressing by Lysbeth Penner D, RN  Safely transfers in and out bed, transfers when toilet  RH PAIN MANAGEMENT RH STG PAIN MANAGED AT OR BELOW PT'S PAIN GOAL Description 4 or less  04/19/2017 2347 - Progressing by Lysbeth Penner D, RN  Administering pain regimen as needed.

## 2017-04-19 NOTE — Consult Note (Signed)
Neuropsychological Consultation   Patient:   [name] [name]   DOB:   July 09, 1975  MR Number:  [id number]  Location:  [place] [address] [facility] [address] [id number] [place]  [id number] Dept: [place]: [phone]           Date of Service:   04/19/2017  Start Time:   9 AM End Time:   10 AM  Provider/Observer:  Ilean Skill, Psy.D.       Clinical Neuropsychologist       Billing Code/Service: [phone] 4 Units  Chief Complaint:    [name] [name] is a 41 year old female with history of  bipolar disorder, diabetes mellitus peripheral neuropathy, hypertension, OSA on CPAP, CKD stage III, morbid obesity as well as necrotizing fasciitis of the groin and debridement of left second toe ulcer.  Recent admission for severe sepsis secondary to left lower extremity cellulitis.  Patient underwent transtibial amputation on 04/07/2017 with subsequent referral for comprehensive rehabilitation program.  Patient has had both depressive and other adjustment symptoms and acute psychosocial events following amputation.  Her long-time boyfriend has broken up with her due to what the patient thinks is her amputation.  She does have history of bipolar disorder and has history of becoming agitated and angry as part of her symptoms.  The patient's mood has remained stable and depressive symptoms appear to be more of adjustment issues.  Reason for Service:  [name] [name] was referred for neuropsychological consultation due to frustration, feelings of loss, worry about long-term coping and other adjustment issues.  Below is the HPI for the current admission.    HPI: [name] [name] is a 41 y.o. right handed female with history of bipolar disorder, diabetes mellitus peripheral neuropathy, hypertension, OSA on CPAP, CKD stage III, morbid obesity as well as necrotizing fasciitis of the groin and debridement of left second  toe ulcer. Recent admission 03/24/2017- 04/02/2017 for severe sepsis secondary to left lower extremity cellulitis. Per chart review and patient, patient lives with her disabled mother who has an amputation, uncle and nephew. 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 Presented 04/07/2017 with decreasing ischemic changes of left lower extremity as well as necrotic abscess. Wound was not felt to be salvageable and underwent transtibial amputation 04/07/2017 with placement of wound VAC per Dr. Sharol Given. Hospital course pain management. MRSA PCR screening positive maintained on contact precautions. Physical therapy evaluation completed with recommendations of physical medicine rehabilitation consult. Patient was admitted for a comprehensive rehabilitation program   Current Status:  The patient reports that she has had times with agitation and frustration and worry about how her life will change.  No indication of bipolar changes but situational coping issues.  Behavioral Observation: [name] [name]  presents as a 41 y.o.-year-old Right African American Female who appeared her stated age. her dress was Appropriate and she was Well Groomed and her manners were Appropriate to the situation.  her participation was indicative of Appropriate and Attentive behaviors.  There were any physical disabilities noted.  she displayed an appropriate level of cooperation and motivation.     Interactions:    Active Appropriate and Attentive  Attention:   within normal limits and attention span and concentration were age appropriate  Memory:   within normal limits; recent and remote memory intact  Visuo-spatial:  within normal limits  Speech (Volume):  normal  Speech:   normal; normal  Thought Process:  Coherent and Relevant  Though Content:  WNL; not suicidal  Orientation:   person, place, time/date and situation  Judgment:   Fair  Planning:   Fair  Affect:    Depressed and Irritable  Mood:    Depressed and  Irritable  Insight:   Fair  Intelligence:   normal  Medical History:   Past Medical History:  Diagnosis Date  . Abnormal Pap smear of cervix 2009  . Abscess    history of multiple abscesses  . Anemia of chronic disease 2002  . Anxiety   . Bipolar disorder ([place])   . Cellulitis 05/21/2014   right eye  . Chronic bronchitis ([place])    "get it q yr" (05/13/2013)  . Chronic pain   . Depression   . Edema of lower extremity   . Endocarditis 2002   subacute bacterial endocarditis.   . Family history of anesthesia complication    "my mom has a hard time coming out from under"  . Fibromyalgia   . GERD (gastroesophageal reflux disease)   . Heart murmur   . History of blood transfusion    "just low blood count" (05/13/2013)  . Hyperlipidemia   . Hypertension   . Hypothyroidism   . Hypothyroidism, adult 03/21/2014  . Necrosis ([place])    and ulceration  . Obesity   . OSA on CPAP    does not wear CPAP  . Peripheral neuropathy   . Type II diabetes mellitus ([place])         Psychiatric History:  Patient has history of bipolar disorder that is more of the agitated state/type  Family Med/Psych History:  Family History  Problem Relation Age of Onset  . Heart failure Mother   . Diabetes Mother   . Kidney disease Mother   . Kidney disease Father   . Diabetes Father   . Diabetes Paternal Grandmother   . Heart failure Paternal Grandmother   . Other Other     Risk of Suicide/Violence: low Patient denies SI or HI  Impression/DX:  [name] [name] is a 41 year old female with history of  bipolar disorder, diabetes mellitus peripheral neuropathy, hypertension, OSA on CPAP, CKD stage III, morbid obesity as well as necrotizing fasciitis of the groin and debridement of left second toe ulcer.  Recent admission for severe sepsis secondary to left lower extremity cellulitis.  Patient underwent transtibial amputation on 04/07/2017 with subsequent referral for comprehensive rehabilitation  program.  Patient has had both depressive and other adjustment symptoms and acute psychosocial events following amputation.  Her long-time boyfriend has broken up with her due to what the patient thinks is her amputation.  She does have history of bipolar disorder and has history of becoming agitated and angry as part of her symptoms.   The patient reports that she has had times with agitation and frustration and worry about how her life will change. Patient does report that these symptoms are present but that she does not feel they represent the start of a bipolar event but more reaction to current situation.  Today we worked on further coping skills and adjustment skills.  Disposition/Plan:  Will see the patient again week of Dec 3rd.         Electronically Signed   _______________________ Ilean Skill, Psy.D.

## 2017-04-20 ENCOUNTER — Inpatient Hospital Stay (INDEPENDENT_AMBULATORY_CARE_PROVIDER_SITE_OTHER): Payer: Medicare Other | Admitting: Orthopedic Surgery

## 2017-04-20 ENCOUNTER — Inpatient Hospital Stay (HOSPITAL_COMMUNITY): Payer: Self-pay | Admitting: Physical Therapy

## 2017-04-20 ENCOUNTER — Inpatient Hospital Stay (HOSPITAL_COMMUNITY): Payer: Medicare Other | Admitting: Occupational Therapy

## 2017-04-20 LAB — GLUCOSE, CAPILLARY
GLUCOSE-CAPILLARY: 87 mg/dL (ref 65–99)
GLUCOSE-CAPILLARY: 279 mg/dL — AB (ref 65–99)
Glucose-Capillary: 151 mg/dL — ABNORMAL HIGH (ref 65–99)
Glucose-Capillary: 185 mg/dL — ABNORMAL HIGH (ref 65–99)
Glucose-Capillary: 331 mg/dL — ABNORMAL HIGH (ref 65–99)
Glucose-Capillary: 290 mg/dL — ABNORMAL HIGH (ref 65–99)

## 2017-04-20 LAB — CBC WITH DIFFERENTIAL/PLATELET
BASOS ABS: 0 10*3/uL (ref 0.0–0.1)
BASOS PCT: 0 %
EOS ABS: 0.3 10*3/uL (ref 0.0–0.7)
EOS PCT: 6 %
HCT: 27.6 % — ABNORMAL LOW (ref 36.0–46.0)
Hemoglobin: 8.2 g/dL — ABNORMAL LOW (ref 12.0–15.0)
Lymphocytes Relative: 38 %
Lymphs Abs: 2.2 10*3/uL (ref 0.7–4.0)
MCH: 24.4 pg — ABNORMAL LOW (ref 26.0–34.0)
MCHC: 29.7 g/dL — ABNORMAL LOW (ref 30.0–36.0)
MCV: 82.1 fL (ref 78.0–100.0)
MONO ABS: 0.5 10*3/uL (ref 0.1–1.0)
Monocytes Relative: 8 %
Neutro Abs: 2.8 10*3/uL (ref 1.7–7.7)
Neutrophils Relative %: 48 %
PLATELETS: 274 10*3/uL (ref 150–400)
RBC: 3.36 MIL/uL — AB (ref 3.87–5.11)
RDW: 16.6 % — AB (ref 11.5–15.5)
WBC: 5.8 10*3/uL (ref 4.0–10.5)

## 2017-04-20 LAB — BASIC METABOLIC PANEL
ANION GAP: 7 (ref 5–15)
BUN: 29 mg/dL — AB (ref 6–20)
CALCIUM: 9 mg/dL (ref 8.9–10.3)
CO2: 29 mmol/L (ref 22–32)
CREATININE: 1.12 mg/dL — AB (ref 0.44–1.00)
Chloride: 99 mmol/L — ABNORMAL LOW (ref 101–111)
GFR calc Af Amer: >60 mL/min (ref >60)
GLUCOSE: 162 mg/dL — AB (ref 65–99)
Potassium: 4 mmol/L (ref 3.5–5.1)
Sodium: 135 mmol/L (ref 135–145)

## 2017-04-20 NOTE — Patient Care Conference (Signed)
Inpatient RehabilitationTeam Conference and Plan of Care Update Date: 04/19/2017   Time: 2:05 PM    Patient Name: [name] [name]      Medical Record Number: [id number]  Date of Birth: December 19, 1975 Sex: Female         Room/Bed: 4M07C/4M07C-01 Payor Info: Payor: MEDICARE / Plan: MEDICARE PART A AND B / Product Type: *No Product type* /    Admitting Diagnosis: LT BKA  Admit Date/Time:  04/11/2017  6:29 PM Admission Comments: No comment available   Primary Diagnosis:  <principal problem not specified> Principal Problem: <principal problem not specified>  Patient Active Problem List   Diagnosis Date Noted  . Stage 3 chronic kidney disease ([place])   . Peripheral edema   . Unilateral complete BKA, left, sequela ([place])   . Poorly controlled type 2 diabetes mellitus with peripheral neuropathy ([place])   . Morbid obesity ([place])   . Status post below knee amputation, left ([place]) 04/11/2017  . Unilateral complete BKA, left, subsequent encounter ([place])   . Post-operative pain   . Bipolar affective disorder ([employer])   . Type 2 diabetes mellitus with peripheral neuropathy ([facility])   . Benign essential HTN   . OSA (obstructive sleep apnea)   . Super obese   . Leukocytosis   . Acute blood loss anemia   . Below knee amputation status, left ([place]) 04/07/2017  . Cutaneous abscess of left foot   . Adjustment disorder with depressed mood 03/26/2017  . Left leg cellulitis   . Severe sepsis ([place]) 03/24/2017  . Hyponatremia 03/24/2017  . Abnormal ECG 03/24/2017  . Sepsis ([place]) 03/24/2017  . Acute bilateral low back pain 02/13/2017  . Heart failure with preserved ejection fraction ([place]) 02/09/2017  . Jerking movements of extremities 12/08/2016  . Routine adult health maintenance 12/08/2016  . Chronic pain of right knee 09/08/2016  . Abdominal muscle pain 09/08/2016  . Vertigo 08/05/2016  . Lightheadedness 07/15/2016  . CKD (chronic kidney disease) stage 2, GFR 60-89 ml/min 07/15/2016  . Bilateral lower  extremity edema 05/13/2016  . OSA on CPAP 05/13/2016  . Vaginal pruritus 03/14/2016  . Morbid obesity due to excess calories ([place]) 11/27/2015  . Callus of foot 11/26/2015  . History of Low serum cortisol level ([place]) 01/07/2015  . Financial difficulty 04/25/2013  . Uncontrolled type 2 diabetes mellitus ([place]) 07/12/2012  . Necrotizing fasciitis s/p OR debridements 07/06/2012  . Carpal tunnel syndrome, bilateral 01/25/2012  . Diabetic peripheral neuropathy ([place]) 07/04/2011  . Anxiety and depression 06/02/2011  . History of abnormal cervical Pap smear 12/13/2007  . GERD 12/06/2007  . Hyperlipidemia 08/29/2007  . Essential hypertension 08/29/2007    Expected Discharge Date: Expected Discharge Date: 04/25/17  Team Members Present: Physician leading conference: Dr. Delice Lesch Social Worker Present: Lennart Pall, LCSW Nurse Present: Benjie Karvonen, RN PT Present: Michaelene Song, PT OT Present: Clyda Greener, OT SLP Present: Windell Moulding, SLP PPS Coordinator present : Daiva Nakayama, RN, CRRN     Current Status/Progress Goal Weekly Team Focus  Medical   Decreased functional mobility secondary to left transtibial amputation 04/07/2017   Improve mobility, HTN/DM, CKD, ABLA, hyponatremia, LE edema  See above   Bowel/Bladder   continent of bowel and bladder. LBM 04/19/17  Remain continent of B and B  Monitor B/B.    Swallow/Nutrition/ Hydration             ADL's   pt still wears hospital gowns and can bathe and dress remained seated mod I. Min  A to supervision for toilet transfers and toileting   supervision to modifed independent  transfers in and out of bathroom, d/c planning, UB endurance, dynamic standing balance, abiilty to don her shoe   Mobility   supervision for all mobility, gait up to 25' RW, and w/c 150-200' supervision, unable to negotiate stairs 2/2 weakness  supervision overall   d/c planning, w/c mobility and gait in home environment    Communication              Safety/Cognition/ Behavioral Observations  Uses proper body mechanics with transfers and toileting (Uses proper body mechanics with transfers and toileting. )  No falls or injuries   Focus on increasing upper body strength    Pain   complaints of phantom pain to stump, oxycodone, robaxin, and lyrica used to manage.   Pain goal is less than 4 out of 10  Assess pain q 4 hours and prn. Treat accordginly.    Skin   BKA incision clean and dry. Staples intact. Oil emmulsion dressing and kerlix dx in place. change daily and prn.   Continue scheduled dressing and no new development in skin breakdown   Assess skin q shift and prn.       *See Care Plan and progress notes for long and short-term goals.     Barriers to Discharge  Current Status/Progress Possible Resolutions Date Resolved   Physician    Medical stability;Decreased caregiver support;Wound Care;Lack of/limited family support;Other (comments);Weight;Weight bearing restrictions     See above  Therapies, optimize DM/HTN, follow labs, may need transufsion again, I/Os and daily weights      Nursing  Inaccessible home environment;Decreased caregiver support;Weight;Medication compliance;Behavior  Continue to educate pt as she experience this life change. Have resourrces available at discharge.             PT  Decreased caregiver support;Inaccessible home environment;Home environment access/layout;Lack of/limited family support  2 STE to enter house, pt planning to seek approval from landlord to build ramp, pt requires wide w/c 2/2 size, most likely will not fit through doorways at home              [place] environment access/layout Two steps to hop up            Discharge Planning/Teaching Needs:  Home with disabled Mom, whom she cared for and uncle and cousin who are in and out. Needs to be mod/i level before going home. Concerned about getting up 2 steps.      Team Discussion:  Adjusting DM meds  and monitor edema.  Needs I/Os and need to monitor daily weights.  Supervision UB self -care;  Min-guard for transfers.  Most goals being set for mod independent but concerns about steps and MUST have ramp.  Revisions to Treatment Plan:  None    Continued Need for Acute Rehabilitation Level of Care: The patient requires daily medical management by a physician with specialized training in physical medicine and rehabilitation for the following conditions: Daily direction of a multidisciplinary physical rehabilitation program to ensure safe treatment while eliciting the highest outcome that is of practical value to the patient.: Yes Daily medical management of patient stability for increased activity during participation in an intensive rehabilitation regime.: Yes Daily analysis of laboratory values and/or radiology reports  with any subsequent need for medication adjustment of medical intervention for : Post surgical problems;Diabetes problems;Blood pressure problems;Renal problems;Other;Wound care problems  Maleiah Dula 04/20/2017, 2:19 PM

## 2017-04-20 NOTE — Progress Notes (Addendum)
Physical Therapy Session Note  Patient Details  Name: [name] [name] MRN: [id number] Date of Birth: Sep 26, 1975  Today's Date: 04/20/2017 PT Individual Time: 1005-1200 AND 1645-1700 PT Individual Time Calculation (min): 115 min AND 15 min   Short Term Goals: Week 2:  PT Short Term Goal 1 (Week 2): STG=LTG due to ELOS  Skilled Therapeutic Interventions/Progress Updates:   Session 1:  Pt in w/c upon arrival and agreeable to therapy, no c/o pain. Pt performing self-care tasks at sink and moving around room for self-care w/ supervision in w/c, verbal cues for energy conservation as pt becoming distracted easily. Reinforced need to conserve energy as mobility now uses more energy than before amputation.   Continued discussion of building ramp at home and w/c management around the home. Pt reports that landlord did give verbal approval to build ramp at house, pt would now like to pursue having ramp built by [employer] members as she is not able to financially pay to have one built or for a rental ramp. Pt has bilateral rails at steps into house, discussed need to potentially ambulate up ramp if w/c does not fit between rails. Pt verbalized understanding.   Worked on UE endurance and strengthening first half of session. Pt self-propelled w/c to/from day room w/ supervision using BUEs, ~250' w/o rest breaks. Moderate increase in work of breathing w/ rest. Performed  arm ergometer, level 4.0, 5 min in each direction. Worked on ambulating up and down 10' ramp w/ supervision using RW. Very fatigued after bout going up ramp requiring prolonged rest break at chair at top of ramp before going back down. Discussed need to have assistance w/ setting up chair at top of ramp at home prior to entering house. Will continue to work on endurance for negotiating ramp at home. Returned to room in w/c w/ supervision and practiced setting up toilet transfer that mimics home environment as pt needing to toilet.  Ambulated to/from toilet in bathroom, ~10' each way, w/ supervision. Verbal cues for technique. Practiced bed<>chair squat pivot transfers w/ supervision for when pt does not have RW with her for transfers. Verbal and visual cues for technique. Ended session in w/c, call bell within reach and all needs met.    Session 2:  Returned later in day to make up missed time from previous session. Pt in w/c and agreeable to therapy, no c/o pain. Happy to spend time out of room. Worked on functional mobility and quality of gait w/ RW. Ambulated 15' x2 w/ decreased seated rest break in between gait bouts compared to previous session. Noted increase in R foot clearance during hops and verbal cues for recreating quality hops. Returned to room and ended session in w/c, call bell within reach and all needs met.   Therapy Documentation Precautions:  Precautions Precautions: Fall Precaution Comments: L BKA, wound vac Restrictions Weight Bearing Restrictions: Yes LLE Weight Bearing: Non weight bearing Other Position/Activity Restrictions: Recent BKA on LLE; wound vac Pain: Pain Assessment Pain Assessment: 0-10 Pain Score: 8  Faces Pain Scale: Hurts a little bit Pain Type: Acute pain Pain Location: Leg Pain Orientation: Left Pain Frequency: Occasional Pain Onset: With Activity Patients Stated Pain Goal: 3 Pain Intervention(s): Medication (See eMAR)(medication given prior to therapy/ )  See Function Navigator for Current Functional Status.   Therapy/Group: Individual Therapy  [name] [name] 04/20/2017, 12:19 PM

## 2017-04-20 NOTE — Progress Notes (Signed)
[facility] PHYSICAL MEDICINE & REHABILITATION     PROGRESS NOTE  Subjective/Complaints:  Pt seen sitting up in her chair this AM.  She slept well overnight.  She has multiple questions regarding her BP, HR, Hb, chronic edema, stump, and meds.  Pt states she would like to d/c invokana because she states it causes amputations, educated patient, continues to refuse stating that she saw it on TV. Pt noted to have 5 2L bottles of orange soda in her room.   ROS: Denies nausea, vomiting, diarrhea, shortness of breath or chest pain   Objective: Vital Signs: Blood pressure (!) 175/90, pulse (!) 107, temperature 97.6 F (36.4 C), temperature source Oral, resp. rate 20, height 5\' 10"  (1.778 m), weight (!) 142.7 kg (314 lb 9.5 oz), last menstrual period 04/06/2017, SpO2 96 %. No results found. Recent Labs    04/19/17 0523 04/20/17 0642  WBC 5.4 5.8  HGB 7.0* 8.2*  HCT 23.5* 27.6*  PLT 238 274   Recent Labs    04/20/17 0642  NA 135  K 4.0  CL 99*  GLUCOSE 162*  BUN 29*  CREATININE 1.12*  CALCIUM 9.0   CBG (last 3)  Recent Labs    04/19/17 1715 04/19/17 2014 04/20/17 0716  GLUCAP 119* 143* 151*    Wt Readings from Last 3 Encounters:  04/20/17 (!) 142.7 kg (314 lb 9.5 oz)  04/07/17 (!) 159.6 kg (351 lb 13.7 oz)  03/29/17 (!) 159.6 kg (351 lb 13.7 oz)    Physical Exam:  BP (!) 175/90 (BP Location: Left Arm)   Pulse (!) 107   Temp 97.6 F (36.4 C) (Oral)   Resp 20   Ht 5\' 10"  (1.778 m)   Wt (!) 142.7 kg (314 lb 9.5 oz)   LMP 04/06/2017 (Exact Date)   SpO2 96%   BMI 45.14 kg/m  Gen: NAD. Vital signs reviewed. Obese. HENT: Normocephalic. Atraumatic. Eyes: EOM are normal.  no discharge.  Cardiovascular: RRR. No JVD   Respiratory:  CTA Bilaterally. Normal effort   GI: Bowel sounds are normal. She exhibits no distension.  Musculoskeletal: She exhibits edema (b/l LE) and tenderness (LLE).  Neurological: She is alert and oriented.  Motor: B/l UE/RLE 5/5 proximal to  distal LLE: HF 4+/5 (pain inhibition, stable) Skin.  Incision clean/dry/intact, healing Psychiatric: Flat  Assessment/Plan: 1. Functional deficits secondary to left transtibial amputation 04/07/2017 which require 3+ hours per day of interdisciplinary therapy in a comprehensive inpatient rehab setting. Physiatrist is providing close team supervision and 24 hour management of active medical problems listed below. Physiatrist and rehab team continue to assess barriers to discharge/monitor patient progress toward functional and medical goals.  Function:  Bathing Bathing position   Position: Shower  Bathing parts Body parts bathed by patient: Front perineal area, Right arm, Left arm, Chest, Abdomen, Right upper leg, Left upper leg, Right lower leg, Left lower leg, Back Body parts bathed by helper: Buttocks  Bathing assist Assist Level: Touching or steadying assistance(Pt > 75%)      Upper Body Dressing/Undressing Upper body dressing Upper body dressing/undressing activity did not occur: N/A What is the patient wearing?: Pull over shirt/dress     Pull over shirt/dress - Perfomed by patient: Thread/unthread right sleeve, Thread/unthread left sleeve, Put head through opening, Pull shirt over trunk          Upper body assist Assist Level: Supervision or verbal cues      Lower Body Dressing/Undressing Lower body dressing   What is the  patient wearing?: Pants, Non-skid slipper socks     Pants- Performed by patient: Thread/unthread right pants leg, Thread/unthread left pants leg, Pull pants up/down   Non-skid slipper socks- Performed by patient: Don/doff right sock Non-skid slipper socks- Performed by helper: Don/doff right sock                  Lower body assist Assist for lower body dressing: Touching or steadying assistance (Pt > 75%)      Toileting Toileting   Toileting steps completed by patient: Adjust clothing prior to toileting, Performs perineal hygiene, Adjust  clothing after toileting Toileting steps completed by helper: Adjust clothing prior to toileting, Adjust clothing after toileting Toileting Assistive Devices: Grab bar or rail  Toileting assist Assist level: Touching or steadying assistance (Pt.75%)   Transfers Chair/bed transfer   Chair/bed transfer method: Stand pivot Chair/bed transfer assist level: Supervision or verbal cues Chair/bed transfer assistive device: Armrests, Medical sales representative Ambulation activity did not occur: Safety/medical concerns(medical (low hemoglobin))   Max distance: 12 ft Assist level: Supervision or verbal cues   Wheelchair   Type: Manual Max wheelchair distance: >150' Assist Level: Supervision or verbal cues  Cognition Comprehension Comprehension assist level: Follows basic conversation/direction with no assist  Expression Expression assist level: Expresses basic needs/ideas: With no assist  Social Interaction Social Interaction assist level: Interacts appropriately with others - No medications needed.  Problem Solving Problem solving assist level: Solves basic problems with no assist  Memory Memory assist level: Recognizes or recalls 90% of the time/requires cueing < 10% of the time    Medical Problem List and Plan:  1. Decreased functional mobility secondary to left transtibial amputation 04/07/2017    Cont CIR 2. DVT Prophylaxis/Anticoagulation: SCD right lower extremity  3. Pain Management: Lyrica 100 mg increased to 3 times daily    oxycodone and Robaxin as needed  4. Mood: Elavil 50 mg daily at bedtime  5. Neuropsych: This patient is capable of making decisions on her own behalf.    Appreciate Neuropsych follow up- will need to monitor for exacerbation of bipolar symptoms 6. Skin/Wound Care: Routine skin checks     Changed her wound dressing to an oil immersion dressing with Kerlix and shrinker over top to be changed daily 7. Fluids/Electrolytes/Nutrition: Routine I&O's  8.  MRSA PCR screening positive. Contact precautions  9. Diabetes mellitus and peripheral neuropathy. Hemoglobin A1c 9.0.    NovoLog 6 units 3 times a day   Lantus insulin 30 units daily at bedtime,  will need to make changes to insulin given d/c invokana   Diabetic teaching    Resumed invokana at 100mg  qam (on 300mg  at home), d/ced on 11/29 due to patient insistence of it causing amputation despite education 10. ?CKD stage III.    Creatinine 1.12 on 11/29, elevation likely from diuretics   Labs ordered for Monday   Encourage fluids   Continue to monitor 11. Hypertension.    Monitor with increased mobility    Isolated elevation this AM, otherwise controlled 12. Constipation. Laxative assistance  13. Hyperlipidemia. Lipitor  14. Morbid obesity   Body mass index is 45.14 kg/m.   Diet and exercise education   Will cont to encourage weight loss to increase endurance and promote overall health 15.  Hyponatremia: Resolved   Sodium 135 on 11/29   Labs ordered for Monday   Cont to monitor 16. ABLA   Hb 8.2 on 11/29, likely concentrated   Labs ordered for  Monday   Hemoccult stools pending   Cont to monitor 17. Peripheral edema   Chronic problem   Lasix increased to 40 mg twice a day on 11/22, changed to bumex on 11/26   CXR reviewed suggesting stable cardiomegaly   BNP WNL   Extremely poor monitoring of I/Os, discussed with nursing again, no output recorded yesterday Filed Weights   04/11/17 1834 04/20/17 0622  Weight: (!) 157.9 kg (348 lb 1.7 oz) (!) 142.7 kg (314 lb 9.5 oz)  18. Sleep disturbance   Trazodone prn  >35 minutes spent with patient, with >30 spent in counseling regarding BP, HR, Hb, chronic edema, stump, and meds.      LOS (Days) 9  A FACE TO FACE EVALUATION WAS PERFORMED  Ankit Lorie Phenix 04/20/2017 8:23 AM

## 2017-04-20 NOTE — Progress Notes (Signed)
Social Work Patient ID: [name] [name], female   DOB: 13-Nov-1975, 41 y.o.   MRN: [id number]    [name] [name]  Social Worker  General Practice  Patient Care Conference  Signed  Date of Service:  04/20/2017  2:17 PM          Signed          [] Hide copied text  [] Hover for details   Inpatient RehabilitationTeam Conference and Plan of Care Update Date: 04/19/2017   Time: 2:05 PM      Patient Name: [name] [name]      Medical Record Number: [id number]  Date of Birth: Oct 11, 1975 Sex: Female         Room/Bed: 4M07C/4M07C-01 Payor Info: Payor: MEDICARE / Plan: MEDICARE PART A AND B / Product Type: *No Product type* /     Admitting Diagnosis: LT BKA  Admit Date/Time:  04/11/2017  6:29 PM Admission Comments: No comment available    Primary Diagnosis:  <principal problem not specified> Principal Problem: <principal problem not specified>       Patient Active Problem List    Diagnosis Date Noted  . Stage 3 chronic kidney disease ([place])    . Peripheral edema    . Unilateral complete BKA, left, sequela ([name] [name])    . Poorly controlled type 2 diabetes mellitus with peripheral neuropathy ([name] [name])    . Morbid obesity ([name] [name])    . Status post below knee amputation, left ([name] [name]) 04/11/2017  . Unilateral complete BKA, left, subsequent encounter ([name] [name])    . Post-operative pain    . Bipolar affective disorder ([name] [name])    . Type 2 diabetes mellitus with peripheral neuropathy ([facility])    . Benign essential HTN    . OSA (obstructive sleep apnea)    . Super obese    . Leukocytosis    . Acute blood loss anemia    . Below knee amputation status, left ([place]) 04/07/2017  . Cutaneous abscess of left foot    . Adjustment disorder with depressed mood 03/26/2017  . Left leg cellulitis    . Severe sepsis ([name] [name]) 03/24/2017  . Hyponatremia 03/24/2017  . Abnormal ECG 03/24/2017  . Sepsis ([name] [name]) 03/24/2017  . Acute bilateral low back pain 02/13/2017  . Heart failure with preserved ejection  fraction ([name] [name]) 02/09/2017  . Jerking movements of extremities 12/08/2016  . Routine adult health maintenance 12/08/2016  . Chronic pain of right knee 09/08/2016  . Abdominal muscle pain 09/08/2016  . Vertigo 08/05/2016  . Lightheadedness 07/15/2016  . CKD (chronic kidney disease) stage 2, GFR 60-89 ml/min 07/15/2016  . Bilateral lower extremity edema 05/13/2016  . OSA on CPAP 05/13/2016  . Vaginal pruritus 03/14/2016  . Morbid obesity due to excess calories ([name] [name]) 11/27/2015  . Callus of foot 11/26/2015  . History of Low serum cortisol level ([name] [name]) 01/07/2015  . Financial difficulty 04/25/2013  . Uncontrolled type 2 diabetes mellitus ([name] [name]) 07/12/2012  . Necrotizing fasciitis s/p OR debridements 07/06/2012  . Carpal tunnel syndrome, bilateral 01/25/2012  . Diabetic peripheral neuropathy ([name] [name]) 07/04/2011  . Anxiety and depression 06/02/2011  . History of abnormal cervical Pap smear 12/13/2007  . GERD 12/06/2007  . Hyperlipidemia 08/29/2007  . Essential hypertension 08/29/2007      Expected Discharge Date: Expected Discharge Date: 04/25/17   Team Members Present: Physician leading conference: [name] [name] Social Worker Present: [name] Pall, LCSW Nurse Present: [name] Karvonen, RN PT Present: [name] [name], PT OT Present: [name] [name], OT SLP Present: [name] [name], SLP PPS Coordinator present : [name] Nakayama, RN, CRRN       Current Status/Progress Goal Weekly Team Focus  Medical     Decreased functional mobility secondary to left transtibial amputation 04/07/2017   Improve mobility, HTN/DM, CKD, ABLA, hyponatremia, LE edema  See above   Bowel/Bladder     continent of bowel and bladder. LBM 04/19/17  Remain continent of B and B  Monitor B/B.    Swallow/Nutrition/ Hydration               ADL's     pt still wears hospital gowns and can bathe and dress remained seated mod I. Min A to supervision for toilet transfers and toileting   supervision to modifed independent  transfers  in and out of bathroom, d/c planning, UB endurance, dynamic standing balance, abiilty to don her shoe   Mobility     supervision for all mobility, gait up to 25' RW, and w/c 150-200' supervision, unable to negotiate stairs 2/2 weakness  supervision overall   d/c planning, w/c mobility and gait in home environment    Communication               Safety/Cognition/ Behavioral Observations   Uses proper body mechanics with transfers and toileting (Uses proper body mechanics with transfers and toileting. )  No falls or injuries   Focus on increasing upper body strength    Pain     complaints of phantom pain to stump, oxycodone, robaxin, and lyrica used to manage.   Pain goal is less than 4 out of 10  Assess pain q 4 hours and prn. Treat accordginly.    Skin     BKA incision clean and dry. Staples intact. Oil emmulsion dressing and kerlix dx in place. change daily and prn.   Continue scheduled dressing and no new development in skin breakdown   Assess skin q shift and prn.      *See Care Plan and progress notes for long and short-term goals.      Barriers to Discharge   Current Status/Progress Possible Resolutions Date Resolved   Physician     Medical stability;Decreased caregiver support;Wound Care;Lack of/limited family support;Other (comments);Weight;Weight bearing restrictions     See above  Therapies, optimize DM/HTN, follow labs, may need transufsion again, I/Os and daily weights      Nursing   Inaccessible home environment;Decreased caregiver support;Weight;Medication compliance;Behavior  Continue to educate pt as she experience this life change. Have resourrces available at discharge.            PT  Decreased caregiver support;Inaccessible home environment;Home environment access/layout;Lack of/limited family support  2 STE to enter house, pt planning to seek approval from landlord to build ramp, pt requires wide w/c 2/2 size, most likely will not fit through doorways at home                [place] environment access/layout Two steps to hop up             Discharge Planning/Teaching Needs:  Home with disabled Mom, whom she cared for and uncle and cousin who are in and out. Needs to be mod/i level before going home. Concerned about getting up 2 steps.      Team Discussion:  Adjusting DM meds and monitor edema.  Needs I/Os  and need to monitor daily weights.  Supervision UB self -care;  Min-guard for transfers.  Most goals being set for mod independent but concerns about steps and MUST have ramp.  Revisions to Treatment Plan:  None    Continued Need for Acute Rehabilitation Level of Care: The patient requires daily medical management by a physician with specialized training in physical medicine and rehabilitation for the following conditions: Daily direction of a multidisciplinary physical rehabilitation program to ensure safe treatment while eliciting the highest outcome that is of practical value to the patient.: Yes Daily medical management of patient stability for increased activity during participation in an intensive rehabilitation regime.: Yes Daily analysis of laboratory values and/or radiology reports with any subsequent need for medication adjustment of medical intervention for : Post surgical problems;Diabetes problems;Blood pressure problems;Renal problems;Other;Wound care problems   Frenchie Dangerfield 04/20/2017, 2:19 PM

## 2017-04-20 NOTE — Progress Notes (Signed)
Occupational Therapy Weekly Progress Note  Patient Details  Name: [name] [name] MRN: [id number] Date of Birth: 1975/07/30  Beginning of progress report period: April 11, 2017 End of progress report period: April 20, 2017  Today's Date: 04/20/2017 OT Individual Time: 0901-1000 OT Individual Time Calculation (min): 59 min    Pt is making steady progress with OT at this time.  She is at a supervision to min assist level for most transfers and selfcare tasks at this time.  She uses the RW for support during all sit to stand and transfers.  She still demonstrates decreased ability to perform functional mobility and hopping on the RLE secondary to increased pain in the right leg as well as edema, but she can complete a few feet for transfers from wheelchair to toilet and then over to the tub/shower bench.  She is on target to meet modified independent to supervision level goals.  Recommend continued CIR level rehab with expected discharge 12/4.    Patient continues to demonstrate the following deficits: muscle weakness and decreased standing balance and decreased balance strategies and therefore will continue to benefit from skilled OT intervention to enhance overall performance with BADL and Vocation.  Patient progressing toward long term goals..  Continue plan of care.  OT Short Term Goals Week 2:  OT Short Term Goal 1 (Week 2): STGs equal to LTGs based on ELOS  Skilled Therapeutic Interventions/Progress Updates:    Pt completed toileting to start session as pt was already on the 3:1 over the toilet when therapist entered the room.  She completed toileting sit to stand and then performed stand pivot transfer over to the tub bench for showering.  She completed all bathing with min assist sit to stand before using the RW again for stand pivot transfer out to the wheelchair.  Worked on donning gowns from seated position with setup and grooming tasks at modified independence.  Nursing in  to wrap the LLE wound and assisted with donning shrinker sock as well.    Therapy Documentation Precautions:  Precautions Precautions: Fall Precaution Comments: L BKA, wound vac Restrictions Weight Bearing Restrictions: Yes LLE Weight Bearing: Non weight bearing Other Position/Activity Restrictions: Recent BKA on LLE; wound vac  Pain: Pain Assessment Pain Assessment: 0-10 Pain Score: 8  Faces Pain Scale: Hurts a little bit Pain Type: Acute pain Pain Location: Leg Pain Orientation: Left Pain Frequency: Occasional Pain Onset: With Activity Patients Stated Pain Goal: 3 Pain Intervention(s): Medication (See eMAR)(medication given prior to therapy/ ) ADL: See Function Navigator for Current Functional Status.   Therapy/Group: Individual Therapy  Margarite Vessel OTR/L 04/20/2017, 12:16 PM

## 2017-04-20 NOTE — Progress Notes (Signed)
Pt was not in a good mood during our shift. Pt normal routine she will inform staff when you ready for bed. Unfortunately patient slept in chair and was upset. Pt was increasing negative attitude about dressing changes. Staff continue to encourage and motivate her.

## 2017-04-21 ENCOUNTER — Inpatient Hospital Stay (HOSPITAL_COMMUNITY): Payer: Self-pay

## 2017-04-21 ENCOUNTER — Inpatient Hospital Stay (HOSPITAL_COMMUNITY): Payer: Self-pay | Admitting: Physical Therapy

## 2017-04-21 ENCOUNTER — Inpatient Hospital Stay (HOSPITAL_COMMUNITY): Payer: Medicare Other | Admitting: Occupational Therapy

## 2017-04-21 LAB — GLUCOSE, CAPILLARY
GLUCOSE-CAPILLARY: 221 mg/dL — AB (ref 65–99)
GLUCOSE-CAPILLARY: 255 mg/dL — AB (ref 65–99)
Glucose-Capillary: 332 mg/dL — ABNORMAL HIGH (ref 65–99)
Glucose-Capillary: 220 mg/dL — ABNORMAL HIGH (ref 65–99)

## 2017-04-21 MED ORDER — SIMETHICONE 80 MG PO CHEW: 80.0000 mg | CHEWABLE_TABLET | Freq: Four times a day (QID) | ORAL | Status: DC | PRN

## 2017-04-21 MED ORDER — INSULIN GLARGINE 100 UNIT/ML ~~LOC~~ SOLN
35.0000 [IU] | Freq: Every day | SUBCUTANEOUS | Status: DC
  Administered 2017-04-21 – 2017-04-24 (×4): 35 [IU] via SUBCUTANEOUS
  Filled 2017-04-21 (×4): qty 0.35

## 2017-04-21 NOTE — Progress Notes (Signed)
Occupational Therapy Session Note  Patient Details  Name: [name] [name] MRN: [id number] Date of Birth: Dec 07, 1975  Today's Date: 04/21/2017 OT Individual Time: 1449-1530 OT Individual Time Calculation (min): 41 min    Short Term Goals: Week 2:  OT Short Term Goal 1 (Week 2): STGs equal to LTGs based on ELOS  Skilled Therapeutic Interventions/Progress Updates:    Pt completed BUE therex exercises from bed level secondary to the LLE wound still bleeding and recommendations for staying in room.  She completed 1 set of 15 repetitions for horizontal shoulder abduction, shoulder flexion, elbow flexion, elbow extension, and shoulder extension (rowing).  She needed mod demonstrational cueing for technique to complete all exercises.  No increased pain report and pt left in bed at end of session.  Call button and phone in reach.    Therapy Documentation Precautions:  Precautions Precautions: Fall Precaution Comments: L BKA,  Restrictions Weight Bearing Restrictions: Yes LLE Weight Bearing: Non weight bearing Other Position/Activity Restrictions: Recent BKA on LLE; wound vac  Pain: Pain Assessment Pain Assessment: No/denies pain ADL: See Function Navigator for Current Functional Status.   Therapy/Group: Individual Therapy  Kavita Bartl OTR/L 04/21/2017, 4:29 PM

## 2017-04-21 NOTE — Progress Notes (Signed)
Social Work Patient ID: [name] [name], female   DOB: [birth year], 41 y.o.   MRN: [id number]   Have reviewed team conference with pt who is aware and agreeable with targeted d/c date of 12/4.  She has been in discussion with PT about home entry/ ramp needs and they continue to assess options.  Pt reports that her landlord has agreed to having a ramp placed "as long as it's temporary."  If home entry issue is not resolved by d/c date, then will have pt to return home via ambulance.  Becky Dupree, LCSW to follow up with pt upon her return on Monday.  Margie Brink, LCSW

## 2017-04-21 NOTE — Plan of Care (Signed)
Pt continent of B/B Pain controlled with current plan of care

## 2017-04-21 NOTE — Progress Notes (Signed)
[name] [name] PHYSICAL MEDICINE & REHABILITATION     PROGRESS NOTE  Subjective/Complaints:   Discussed blood sugars at length today, pt "gets jittery " if CBG in low 100s  ROS: Denies nausea, vomiting, diarrhea, shortness of breath or chest pain   Objective: Vital Signs: Blood pressure (!) 142/78, pulse 97, temperature 98.4 F (36.9 C), temperature source Oral, resp. rate 19, height 5\' 10"  (1.778 m), weight (!) 142.7 kg (314 lb 9.5 oz), last menstrual period 04/06/2017, SpO2 97 %. No results found. Recent Labs    04/19/17 0523 04/20/17 0642  WBC 5.4 5.8  HGB 7.0* 8.2*  HCT 23.5* 27.6*  PLT 238 274   Recent Labs    04/20/17 0642  NA 135  K 4.0  CL 99*  GLUCOSE 162*  BUN 29*  CREATININE 1.12*  CALCIUM 9.0   CBG (last 3)  Recent Labs    04/20/17 1819 04/20/17 2126 04/21/17 0656  GLUCAP 331* 290* 332*    Wt Readings from Last 3 Encounters:  04/20/17 (!) 142.7 kg (314 lb 9.5 oz)  04/07/17 (!) 159.6 kg (351 lb 13.7 oz)  03/29/17 (!) 159.6 kg (351 lb 13.7 oz)    Physical Exam:  BP (!) 142/78 (BP Location: Right Arm)   Pulse 97   Temp 98.4 F (36.9 C) (Oral)   Resp 19   Ht 5\' 10"  (1.778 m)   Wt (!) 142.7 kg (314 lb 9.5 oz)   LMP 04/06/2017 (Exact Date)   SpO2 97%   BMI 45.14 kg/m  Gen: NAD. Vital signs reviewed. Obese. HENT: Normocephalic. Atraumatic. Eyes: EOM are normal.  no discharge.  Cardiovascular: RRR. No JVD   Respiratory:  CTA Bilaterally. Normal effort   GI: Bowel sounds are normal. She exhibits no distension.  Musculoskeletal: She exhibits edema (b/l LE) and tenderness (LLE).  Neurological: She is alert and oriented.  Motor: B/l UE/RLE 5/5 proximal to distal LLE: HF 4+/5 (pain inhibition, stable) Skin.  Incision clean/dry/intact, healing Psychiatric: Flat  Assessment/Plan: 1. Functional deficits secondary to left transtibial amputation 04/07/2017 which require 3+ hours per day of interdisciplinary therapy in a comprehensive inpatient rehab  setting. Physiatrist is providing close team supervision and 24 hour management of active medical problems listed below. Physiatrist and rehab team continue to assess barriers to discharge/monitor patient progress toward functional and medical goals.  Function:  Bathing Bathing position   Position: Shower  Bathing parts Body parts bathed by patient: Front perineal area, Right arm, Left arm, Chest, Abdomen, Right upper leg, Left upper leg, Right lower leg, Left lower leg, Back Body parts bathed by helper: Buttocks  Bathing assist Assist Level: Touching or steadying assistance(Pt > 75%)      Upper Body Dressing/Undressing Upper body dressing Upper body dressing/undressing activity did not occur: N/A What is the patient wearing?: Pull over shirt/dress     Pull over shirt/dress - Perfomed by patient: Thread/unthread right sleeve, Thread/unthread left sleeve, Put head through opening, Pull shirt over trunk          Upper body assist Assist Level: Supervision or verbal cues      Lower Body Dressing/Undressing Lower body dressing   What is the patient wearing?: Non-skid slipper socks     Pants- Performed by patient: Thread/unthread right pants leg, Thread/unthread left pants leg, Pull pants up/down   Non-skid slipper socks- Performed by patient: Don/doff right sock Non-skid slipper socks- Performed by helper: Don/doff right sock  Lower body assist Assist for lower body dressing: Touching or steadying assistance (Pt > 75%)      Toileting Toileting   Toileting steps completed by patient: Adjust clothing prior to toileting, Performs perineal hygiene, Adjust clothing after toileting Toileting steps completed by helper: Adjust clothing prior to toileting, Adjust clothing after toileting Toileting Assistive Devices: Grab bar or rail  Toileting assist Assist level: Touching or steadying assistance (Pt.75%)   Transfers Chair/bed transfer   Chair/bed transfer  method: Squat pivot, Stand pivot Chair/bed transfer assist level: Supervision or verbal cues Chair/bed transfer assistive device: Armrests, Walker     Locomotion Ambulation Ambulation activity did not occur: Safety/medical concerns(medical (low hemoglobin))   Max distance: 10' Assist level: Supervision or verbal cues   Wheelchair   Type: Manual Max wheelchair distance: 250' Assist Level: Supervision or verbal cues  Cognition Comprehension Comprehension assist level: Follows basic conversation/direction with no assist  Expression Expression assist level: Expresses basic needs/ideas: With no assist  Social Interaction Social Interaction assist level: Interacts appropriately with others - No medications needed.  Problem Solving Problem solving assist level: Solves basic problems with no assist  Memory Memory assist level: Recognizes or recalls 90% of the time/requires cueing < 10% of the time    Medical Problem List and Plan:  1. Decreased functional mobility secondary to left transtibial amputation 04/07/2017    Cont CIR PT, OT 2. DVT Prophylaxis/Anticoagulation: SCD right lower extremity  3. Pain Management: Lyrica 100 mg increased to 3 times daily    oxycodone and Robaxin as needed  4. Mood: Elavil 50 mg daily at bedtime  5. Neuropsych: This patient is capable of making decisions on her own behalf.    Appreciate Neuropsych follow up- will need to monitor for exacerbation of bipolar symptoms 6. Skin/Wound Care: Routine skin checks     Changed her wound dressing to an oil immersion dressing with Kerlix and shrinker over top to be changed daily 7. Fluids/Electrolytes/Nutrition: Routine I&O's  8. MRSA PCR screening positive. Contact precautions  9. Diabetes mellitus and peripheral neuropathy. Hemoglobin A1c 9.0.    NovoLog 6 units 3 times a day   Lantus insulin 30 units daily at bedtime,  will increase to 35U  Diabetic teaching    Resumed invokana at 100mg  qam (on 300mg  at home),  d/ced on 11/29 due to patient insistence of it causing amputation despite education CBG (last 3)  Recent Labs    04/20/17 1819 04/20/17 2126 04/21/17 0656  GLUCAP 331* 290* 332*   10. ?CKD stage III.    Creatinine 1.12 on 11/29, elevation likely from diuretics   Labs ordered for Monday   Encourage fluids   Continue to monitor 11. Hypertension.    Monitor with increased mobility    Mild/mod elevation some variablity will monitor for pattern Vitals:   04/20/17 1421 04/21/17 0308  BP: (!) 158/86 (!) 142/78  Pulse: (!) 104 97  Resp: 18 19  Temp: 98.5 F (36.9 C) 98.4 F (36.9 C)  SpO2: 98% 97%   12. Constipation. Laxative assistance  13. Hyperlipidemia. Lipitor  14. Morbid obesity   Body mass index is 45.14 kg/m.   Diet and exercise education   Will cont to encourage weight loss to increase endurance and promote overall health 15.  Hyponatremia: Resolved   Sodium 135 on 11/29   Labs ordered for Monday   Cont to monitor 16. ABLA   Hb 8.2 on 11/29, likely concentrated   Labs ordered for Monday   Hemoccult  stools pending   Cont to monitor 17. Peripheral edema   Chronic problem- diuresing I 1392, O 4775 on 11/29   Lasix increased to 40 mg twice a day on 11/22, changed to bumex on 11/26   CXR reviewed suggesting stable cardiomegaly   BNP WNL    Filed Weights   04/11/17 1834 04/20/17 0622  Weight: (!) 157.9 kg (348 lb 1.7 oz) (!) 142.7 kg (314 lb 9.5 oz)  18. Sleep disturbance   Trazodone prn      LOS (Days) 10  A FACE TO FACE EVALUATION WAS PERFORMED  Charlett Blake 04/21/2017 7:17 AM

## 2017-04-21 NOTE — Progress Notes (Signed)
Patient bumped her transmetatarsal amputation site this morning while with therapies. There was some bloody drainage wound does not appear to have any dehiscence a pressure wrap was applied as well as ice. Dr. Jess Barters office was notified for follow-up and will continue to monitor

## 2017-04-21 NOTE — Progress Notes (Signed)
Physical Therapy Session Note  Patient Details  Name: [name] [name] MRN: [id number] Date of Birth: 05-03-1976  Today's Date: 04/21/2017 PT Individual Time: 0900-1015 AND 1332-1400 PT Individual Time Calculation (min): 75 min AND 28 min   Short Term Goals: Week 2:  PT Short Term Goal 1 (Week 2): STG=LTG due to ELOS  Skilled Therapeutic Interventions/Progress Updates:   Session 1:  Pt sitting EOB upon arrival and agreeable to therapy, no c/o pain. Worked on functional strength this session and pt self-propelled w/c to gym using BUEs w/ supervision. Switched to 20" wide w/c to make w/c mobility in the home more feasible. Cautioned pt on skin breakdown at lateral thighs as 20" w/c fits tighter on skin than 22" w/c, will follow up later this date to check for skin irritation. Practiced sit<>stands w/ RW and stepping up to 2" step using RW for UE support. Pt able to clear 2" step 50% of attempts. Verbal and visual cues for technique and discussed significance of clearing step if pt is unable to get ramp for home. During one attempt, pt had LOB posteriorly and sat back down into chair per PT's instructions if LOB were to occur. Pt bumped residual limb on w/c during return to sitting, after which a moderate amount of bleeding was noted on residual limb bandages. Pt transferred back to room in w/c via Total A, made RN and NT aware. Assisted pt in squat pivot transfer back to EOB and to supine, assisted RN w/ bandage changes while providing assist w/ residual limb management. Bleeding stopped w/ pressure bandage, all sutures and incision remained intact. PA-C made aware and present to assess pt, vitals taken and WNL. Educated pt on keeping residual limb elevated until next therapy session to keep bleeding at a minimum in addition to applying ice for bleeding cessation. Ended session in supine, call bell within reach and all needs met. Pt w/ no c/o and resting.   Session 2:  Pt on toilet upon arrival  and agreeable to therapy. Residual limb wraps no longer on and incision w/ minor bleeding noted. RN aware and alerting PA-C. Assisted pt w/ getting back to w/c via squat pivot and back to EOB via same technique w/ supervision. Pt transferred to supine and continued education on techniques to decrease bleeding as discussed in previous session. Wrapped residual limb in hip spica technique per PA-C's request to assist w/ bleeding. Ended session in supine, call bell within reach and all needs met.   Therapy Documentation Precautions:  Precautions Precautions: Fall Precaution Comments: L BKA, wound vac Restrictions Weight Bearing Restrictions: Yes LLE Weight Bearing: Non weight bearing Other Position/Activity Restrictions: Recent BKA on LLE; wound vac Vital Signs:   See Function Navigator for Current Functional Status.   Therapy/Group: Individual Therapy  Jonna Dittrich K Arnette 04/21/2017, 3:15 PM

## 2017-04-21 NOTE — Progress Notes (Addendum)
Per patient and therapy, patient hit stump during session. Returned to room and pressure applied to stop bleeding. PA assessed patient. Pressure dressing placed, ice applied and leg elevated. Patient tolerating.   Afternoon, reassessed incision. Still bleeding on lateral side, soaked 1 abd pad. One staple appeared to have pulled loose. Notified Dan, PA and orders given to wrap dressing around hip for additional pressure. Wrapped with therapy assist. Will continue to monitor

## 2017-04-21 NOTE — Progress Notes (Signed)
Occupational Therapy Session Note  Patient Details  Name: [name] [name] MRN: [id number] Date of Birth: 1975-06-12  Today's Date: 04/21/2017 OT Individual Time: 257-505 18 min    Short Term Goals: Week 2:  OT Short Term Goal 1 (Week 2): STGs equal to LTGs based on ELOS  Skilled Therapeutic Interventions/Progress Updates:     1:1. Pt awake and ready for tx upon entering room requesting to shower with no c/o pain. OT applies proper occlusives for shower. Pt squat pivot transfer with supervision EOB>w/c and stand pivot transfer with RW w/c<>TTB with CGA and VC for sequencing and safety awarness. Pt bathes at sit to stand level with min guard for sit to stand while pt washes buttocks.  Pt brushes teeth in shower with set up. Pt takes longer than reasonable time to shower and she reports she takes long showers at home. Pt returns to w/c as stated above .PT dons gowns per preference and no other clothes.  Pt elevates leg on bed to apply lotion and sock. Exited session with pt seated in EOB with call light in reach and all needs met.  Therapy Documentation Precautions:  Precautions Precautions: Fall Precaution Comments: L BKA, wound vac Restrictions Weight Bearing Restrictions: Yes LLE Weight Bearing: Non weight bearing Other Position/Activity Restrictions: Recent BKA on LLE; wound vac General:   See Function Navigator for Current Functional Status.   Therapy/Group: Individual Therapy  [name] 04/21/2017, 7:24 AM

## 2017-04-22 ENCOUNTER — Inpatient Hospital Stay (HOSPITAL_COMMUNITY): Payer: Self-pay | Admitting: Physical Therapy

## 2017-04-22 LAB — GLUCOSE, CAPILLARY
GLUCOSE-CAPILLARY: 223 mg/dL — AB (ref 65–99)
GLUCOSE-CAPILLARY: 282 mg/dL — AB (ref 65–99)
Glucose-Capillary: 251 mg/dL — ABNORMAL HIGH (ref 65–99)
Glucose-Capillary: 141 mg/dL — ABNORMAL HIGH (ref 65–99)

## 2017-04-22 NOTE — Progress Notes (Signed)
[name] [name] is a 41 y.o. female 1976/04/22 [id number]  Subjective: No new complaints. No new problems. Feeling OK.  Objective: Vital signs in last 24 hours: Temp:  [98 F (36.7 C)-98.4 F (36.9 C)] 98.4 F (36.9 C) (12/01 0540) Pulse Rate:  [91-103] 91 (12/01 0540) Resp:  [18] 18 (12/01 0540) BP: (133-142)/(78-87) 133/78 (12/01 0540) SpO2:  [96 %-99 %] 99 % (12/01 0540) Weight:  [340 lb 13.3 oz (154.6 kg)] 340 lb 13.3 oz (154.6 kg) (12/01 0540) Weight change:  Last BM Date: 04/20/17  Intake/Output from previous day: 11/30 0701 - 12/01 0700 In: 1320 [P.O.:1320] Out: 3075 [Urine:3075] Last cbgs: CBG (last 3)  Recent Labs    04/21/17 2057 04/22/17 0714 04/22/17 1153  GLUCAP 255* 251* 141*     Physical Exam General: No apparent distress  In a w/c Obese HEENT: not dry Lungs: Normal effort. Lungs clear to auscultation, no crackles or wheezes. Cardiovascular: Regular rate and rhythm, no edema Abdomen: S/NT/ND; BS(+) Musculoskeletal:  unchanged Neurological: No new neurological deficits Wounds: L BKA - clean    Skin: clear   Mental state: Alert, oriented, cooperative    Lab Results: BMET    Component Value Date/Time   NA 135 04/20/2017 0642   NA 138 09/07/2016 1526   K 4.0 04/20/2017 0642   CL 99 (L) 04/20/2017 0642   CO2 29 04/20/2017 0642   GLUCOSE 162 (H) 04/20/2017 0642   BUN 29 (H) 04/20/2017 0642   BUN 24 09/07/2016 1526   CREATININE 1.12 (H) 04/20/2017 0642   CREATININE 0.86 02/03/2014 1017   CALCIUM 9.0 04/20/2017 0642   GFRNONAA >60 04/20/2017 0642   GFRNONAA 83 07/23/2013 1604   GFRAA >60 04/20/2017 0642   GFRAA >89 07/23/2013 1604   CBC    Component Value Date/Time   WBC 5.8 04/20/2017 0642   RBC 3.36 (L) 04/20/2017 0642   HGB 8.2 (L) 04/20/2017 0642   HGB 12.1 08/13/2015 1629   HCT 27.6 (L) 04/20/2017 0642   HCT 36.3 08/13/2015 1629   PLT 274 04/20/2017 0642   PLT 232 08/13/2015 1629   MCV 82.1 04/20/2017 0642   MCV 79  08/13/2015 1629   MCH 24.4 (L) 04/20/2017 0642   MCHC 29.7 (L) 04/20/2017 0642   RDW 16.6 (H) 04/20/2017 0642   RDW 15.3 08/13/2015 1629   LYMPHSABS 2.2 04/20/2017 0642   MONOABS 0.5 04/20/2017 0642   EOSABS 0.3 04/20/2017 0642   BASOSABS 0.0 04/20/2017 0642    Studies/Results: No results found.  Medications: I have reviewed the patient's current medications.  Assessment/Plan:   1. S/p left transtibial amputation 04/07/2017 - CIR: PT/OT 2. DVT proph: SCDs 3. Pain control: Oxy, Lyrica, Robaxin 4. Depression: Elavil 5. MRSA (+) PCR - on precautions 6. DM-2. Lantus and Novolog SS 7. CKD 3 8. Constipation - LOC 9. Morbid obesity - management was discussed previously 10.Dyslipidemia - Lipitor 11. ABLA monitoring CBC 12. Insomnia - on Rx           Length of stay, days: 11  Walker Kehr , MD 04/22/2017, 2:15 PM

## 2017-04-22 NOTE — Progress Notes (Signed)
Physical Therapy Session Note  Patient Details  Name: RAKEYA GLAB MRN: [id number] Date of Birth: 12-28-1975  Today's Date: 04/22/2017 PT Individual Time: 0800-0900 PT Individual Time Calculation (min): 60 min   Short Term Goals: Week 2:  PT Short Term Goal 1 (Week 2): STG=LTG due to ELOS  Skilled Therapeutic Interventions/Progress Updates:   Pt sitting EOB upon arrival and agreeable to therapy, no c/o pain. Worked on independence w/ functional mobility in home environment. Set up bathing at sink like it would be set up at home. Provided set-up assist only for time management for bathing and self-care. However, pt able to independently verbalize how she would set up bathing at home w/o assistance. Pt performed multiple sit<>stand transfers and dynamic standing balance at sink w/ supervision using sink for UE support. Rewrapped pt's residual limb and instructed pt on ace wrap compression bandage should both shrinkers be soiled at home. Visualized incision, no active bleeding, only dried blood from previous day's episode of bleeding - RN made aware. Ended session sitting up in 20x18 w/c w/ instructions to switch back to 22x18 for comfort later in day but to trial using 20x18 for mobility as it would make in-home mobility easier. Pt verbalized understanding. Call bell within reach and all needs met.   Therapy Documentation Precautions:  Precautions Precautions: Fall Precaution Comments: L BKA,  Restrictions Weight Bearing Restrictions: Yes LLE Weight Bearing: Non weight bearing Other Position/Activity Restrictions: Recent BKA on LLE; wound vac Vital Signs:    See Function Navigator for Current Functional Status.   Therapy/Group: Individual Therapy  Lavetta Geier K Arnette 04/22/2017, 12:08 PM

## 2017-04-23 ENCOUNTER — Inpatient Hospital Stay (HOSPITAL_COMMUNITY): Payer: Self-pay | Admitting: Occupational Therapy

## 2017-04-23 LAB — GLUCOSE, CAPILLARY
Glucose-Capillary: 187 mg/dL — ABNORMAL HIGH (ref 65–99)
Glucose-Capillary: 164 mg/dL — ABNORMAL HIGH (ref 65–99)
Glucose-Capillary: 210 mg/dL — ABNORMAL HIGH (ref 65–99)
Glucose-Capillary: 303 mg/dL — ABNORMAL HIGH (ref 65–99)

## 2017-04-23 NOTE — Progress Notes (Signed)
[name] [name] is a 41 y.o. female [birth year] [id number]  Subjective: No new complaints. Pt fell yesterday: denies injuries. Slept well. Feeling OK.  Objective: Vital signs in last 24 hours: Temp:  [97.8 F (36.6 C)-98.7 F (37.1 C)] 97.8 F (36.6 C) (12/02 1400) Pulse Rate:  [95-100] 100 (12/02 1400) Resp:  [16] 16 (12/02 1400) BP: (120-166)/(59-85) 120/59 (12/02 1400) SpO2:  [93 %-100 %] 95 % (12/02 1400) Weight change:  Last BM Date: 04/23/17  Intake/Output from previous day: 12/01 0701 - 12/02 0700 In: 1660 [P.O.:1660] Out: 3800 [Urine:3800] Last cbgs: CBG (last 3)  Recent Labs    04/23/17 0625 04/23/17 1211 04/23/17 1710  GLUCAP 187* 164* 210*     Physical Exam General: No apparent distress.  In a w/c.  Obese HEENT: not dry Lungs: Normal effort. Lungs clear to auscultation, no crackles or wheezes. Cardiovascular: Regular rate and rhythm, no edema Abdomen: S/NT/ND; BS(+) Musculoskeletal:  unchanged Neurological: No new neurological deficits Wounds: clean, minor oozing  Skin: clear  Aging changes Mental state: Alert, oriented, cooperative    Lab Results: BMET    Component Value Date/Time   NA 135 04/20/2017 0642   NA 138 09/07/2016 1526   K 4.0 04/20/2017 0642   CL 99 (L) 04/20/2017 0642   CO2 29 04/20/2017 0642   GLUCOSE 162 (H) 04/20/2017 0642   BUN 29 (H) 04/20/2017 0642   BUN 24 09/07/2016 1526   CREATININE 1.12 (H) 04/20/2017 0642   CREATININE 0.86 02/03/2014 1017   CALCIUM 9.0 04/20/2017 0642   GFRNONAA >60 04/20/2017 0642   GFRNONAA 83 07/23/2013 1604   GFRAA >60 04/20/2017 0642   GFRAA >89 07/23/2013 1604   CBC    Component Value Date/Time   WBC 5.8 04/20/2017 0642   RBC 3.36 (L) 04/20/2017 0642   HGB 8.2 (L) 04/20/2017 0642   HGB 12.1 08/13/2015 1629   HCT 27.6 (L) 04/20/2017 0642   HCT 36.3 08/13/2015 1629   PLT 274 04/20/2017 0642   PLT 232 08/13/2015 1629   MCV 82.1 04/20/2017 0642   MCV 79 08/13/2015 1629   MCH 24.4  (L) 04/20/2017 0642   MCHC 29.7 (L) 04/20/2017 0642   RDW 16.6 (H) 04/20/2017 0642   RDW 15.3 08/13/2015 1629   LYMPHSABS 2.2 04/20/2017 0642   MONOABS 0.5 04/20/2017 0642   EOSABS 0.3 04/20/2017 0642   BASOSABS 0.0 04/20/2017 0642    Studies/Results: No results found.  Medications: I have reviewed the patient's current medications.  Assessment/Plan:    1.  Status post left transtibial amputation on 04/07/2017.  Continue with physical and occupational therapy 2.  DVT prophylaxis with sequential compression devices. 3.  Pain control with oxycodone, Lyrica and Robaxin. 4. PCR positive for MRSA on contact precautions  5. depression continue with Elavil 6.  Type 2 diabetes.  On Lantus and NovoLog sliding scale 7.  Chronic kidney disease stage III.   8 Constipation.  Laxative of choice.   9. Morbid obesity.  Management per prior discussions  10.  Dyslipidemia.  Continue with Lipitor.   11. acute blood loss anemia.  Continue to monitor CBC.   12. insomnia on Elavil.       Length of stay, days: 12  Walker Kehr , MD 04/23/2017, 8:17 PM

## 2017-04-23 NOTE — Progress Notes (Addendum)
Pt is exhibiting stages of grief (amputee). She is mostly noted to be anger. Pt fell in the bathroom resulted in to accept assistance from staff. Staff had to re-approach pt three times in order to get her to agree for staff assess her skin. Writer and nurse noted that right of the stump it appears to be opening and draining. Administered a pressure dressing to cease the drainage. With noting the patient's grief, may suggest a psych eval with understanding proir to hospitalization patient was in a heterosexual relationship boyfriend broke up with her and she is her mother primary care giver.

## 2017-04-23 NOTE — Progress Notes (Signed)
Occupational Therapy Session Note  Patient Details  Name: [name] [name] MRN: [id number] Date of Birth: [birth year]  Today's Date: 04/23/2017 OT Individual Time: 1300-1400 OT Individual Time Calculation (min): 60 min   Short Term Goals: Week 2:  OT Short Term Goal 1 (Week 2): STGs equal to LTGs based on ELOS  Skilled Therapeutic Interventions/Progress Updates:    Tx focus on ADL retraining, balance, and functional transfers with device.   Pt greeted in w/c after lunch. Her residual limb bandages were soiled and she reported RN would be in for dressing change. Pt requesting shower. Assisted RN with dressing changes and via interprofessional collaboration, decided sponge bathing was safest option due to wound opening/bloody drainage and infection risk. Pt reported feeling disappointed but that she understood reasoning behind decision. Pt hopping into bathroom 10 ft with RW for toilet transfer. Able to complete 3/3 components of toileting herself. 1 LOB during clothing mgt with Min A to recover. She then ambulated short distance to w/c placed at bathroom doorway. LB bathing completed w/c level with supervision for pericare while standing. She needed assist to wash lateral aspect of Rt foot and would benefit from [place] sponge. Assist for applying foot cream with R LE elevated on bed. After assisted her with washing back.  Could not stay with her for remainder of UB bathing due to time constraints. Pt left at sink with all needs/setup for seated bathing tasks.   Throughout session, openly discussed her feelings regarding recent fall and wound opening this past week. Pt reports feeling discouraged by setbacks but motivated to continue working in therapy to prepare for home. "I cry, but I don't keep crying." Provided her emotional support and encouragement to enhance psychosocial wellbeing.   Therapy Documentation Precautions:  Precautions Precautions: Fall Precaution Comments: L BKA,   Restrictions Weight Bearing Restrictions: Yes LLE Weight Bearing: Non weight bearing Other Position/Activity Restrictions: Recent BKA on LLE; wound vac Vital Signs: Therapy Vitals Temp: 97.8 F (36.6 C) Temp Source: Oral Pulse Rate: 100 Resp: 16 BP: (!) 120/59 Patient Position (if appropriate): Sitting Oxygen Therapy SpO2: 95 % O2 Device: Not Delivered Pain: Pain Assessment Pain Assessment: 0-10 Pain Score: 2  Pain Type: Acute pain;Surgical pain Pain Location: Leg Pain Orientation: Left Pain Descriptors / Indicators: Aching Pain Onset: On-going Pain Intervention(s): Medication (See eMAR) ADL:   See Function Navigator for Current Functional Status.   Therapy/Group: Individual Therapy  Shaleka Brines A Carnetta Losada 04/23/2017, 4:10 PM

## 2017-04-23 NOTE — Progress Notes (Signed)
RN went to patient's room to give her meds, patient asked to go to the bathroom; RN took patient to the bathroom, patient asked RN to leave and that she is ok, RN asked patient if he can just stand and wait until patient is on the toilet for her safety but patient insisted that RN must leave the room now; RN went out and stood by the door. As patient was trying to stand up she lost her balance and knee on the floor before RN could get to her. RN call for help to get the patient off the floor, other staff members came but patient did not allow staffs to help get her off the floor. she was yelling at staffs to leave her alone. So staffs stood by while the patient get herself up into the chair. Assessment done and she was bleeding at her surgical site. dressing changed and MD notified. We continue to monitor.

## 2017-04-23 NOTE — Plan of Care (Signed)
  Consults RH LIMB LOSS PATIENT EDUCATION Description Description: See Patient Education module for eduction specifics. 04/23/2017 0353 - Progressing by Blinda Leatherwood, RN   RH SKIN INTEGRITY RH STG SKIN FREE OF INFECTION/BREAKDOWN Description Min assist  04/23/2017 0353 - Progressing by Blinda Leatherwood, RN   RH SAFETY RH STG ADHERE TO SAFETY PRECAUTIONS W/ASSISTANCE/DEVICE Description STG Adhere to Safety Precautions With min Assistance/Device.  04/23/2017 0353 - Progressing by Blinda Leatherwood, RN   RH PAIN MANAGEMENT RH STG PAIN MANAGED AT OR BELOW PT'S PAIN GOAL Description 4 or less  04/23/2017 0353 - Progressing by Blinda Leatherwood, RN

## 2017-04-23 NOTE — Progress Notes (Signed)
During dressing change to left stump observed large amount of bright red bloody drainage and displaced staple causing open area to the lateral side of the stump. Oil emulsion and gauze dressing applied. Ace wrap compression dressing applied. Call placed to ortho on call Dr. Durward Fortes covering for Dr. Sharol Given. New orders to continue with gauze and compression dressing and Dr. Durward Fortes will notify Dr. Sharol Given of concerns and ask to round on pt tomorrow.

## 2017-04-24 ENCOUNTER — Encounter: Payer: Self-pay | Admitting: *Deleted

## 2017-04-24 ENCOUNTER — Inpatient Hospital Stay (HOSPITAL_COMMUNITY): Payer: Self-pay

## 2017-04-24 ENCOUNTER — Inpatient Hospital Stay (HOSPITAL_COMMUNITY): Payer: Medicare Other | Admitting: Occupational Therapy

## 2017-04-24 DIAGNOSIS — W19XXXA Unspecified fall, initial encounter: Secondary | ICD-10-CM

## 2017-04-24 LAB — GLUCOSE, CAPILLARY
GLUCOSE-CAPILLARY: 212 mg/dL — AB (ref 65–99)
GLUCOSE-CAPILLARY: 172 mg/dL — AB (ref 65–99)
GLUCOSE-CAPILLARY: 193 mg/dL — AB (ref 65–99)
GLUCOSE-CAPILLARY: 189 mg/dL — AB (ref 65–99)
Glucose-Capillary: 227 mg/dL — ABNORMAL HIGH (ref 65–99)

## 2017-04-24 LAB — CBC WITH DIFFERENTIAL/PLATELET
BASOS ABS: 0 10*3/uL (ref 0.0–0.1)
Basophils Relative: 0 %
EOS ABS: 0.4 10*3/uL (ref 0.0–0.7)
EOS PCT: 5 %
HCT: 28.2 % — ABNORMAL LOW (ref 36.0–46.0)
HEMOGLOBIN: 8.2 g/dL — AB (ref 12.0–15.0)
LYMPHS ABS: 2.9 10*3/uL (ref 0.7–4.0)
LYMPHS PCT: 42 %
MCH: 24.3 pg — AB (ref 26.0–34.0)
MCHC: 29.1 g/dL — ABNORMAL LOW (ref 30.0–36.0)
MCV: 83.7 fL (ref 78.0–100.0)
Monocytes Absolute: 0.4 10*3/uL (ref 0.1–1.0)
Monocytes Relative: 6 %
NEUTROS PCT: 47 %
Neutro Abs: 3.3 10*3/uL (ref 1.7–7.7)
PLATELETS: 309 10*3/uL (ref 150–400)
RBC: 3.37 MIL/uL — AB (ref 3.87–5.11)
RDW: 16.9 % — ABNORMAL HIGH (ref 11.5–15.5)
WBC: 7 10*3/uL (ref 4.0–10.5)

## 2017-04-24 LAB — BASIC METABOLIC PANEL
Anion gap: 9 (ref 5–15)
BUN: 31 mg/dL — AB (ref 6–20)
CHLORIDE: 97 mmol/L — AB (ref 101–111)
CO2: 29 mmol/L (ref 22–32)
Calcium: 8.8 mg/dL — ABNORMAL LOW (ref 8.9–10.3)
Creatinine, Ser: 1.05 mg/dL — ABNORMAL HIGH (ref 0.44–1.00)
Glucose, Bld: 194 mg/dL — ABNORMAL HIGH (ref 65–99)
POTASSIUM: 4.4 mmol/L (ref 3.5–5.1)
SODIUM: 135 mmol/L (ref 135–145)

## 2017-04-24 MED ORDER — WHITE PETROLATUM EX OINT
TOPICAL_OINTMENT | CUTANEOUS | Status: DC | PRN
  Administered 2017-04-24: 16:00:00 via TOPICAL
  Filled 2017-04-24: qty 28.35

## 2017-04-24 NOTE — Progress Notes (Signed)
Social Work Patient ID: [name] [name], female   DOB: [birth year], 41 y.o.   MRN: [id number]  Met with pt to discuss readiness to go home tomorrow. She feels she will be fine at home and wants to go home tomorrow. She is aware she needs to take her time and be safe in her transfers. Her Mom will be there but not assist her, so in case she falls someone can be called for assist. Discussed going home in an ambulance and having her wheelchair delivered to her home since no one will be coming to get it. Referral made to [facility] for wheelchair and follow up therapies she was an active pt with them. Will await Dr Sharol Given recommendations and work on discharge.

## 2017-04-24 NOTE — Progress Notes (Signed)
Social Work  Discharge Note  The overall goal for the admission was met for:   Discharge location: Yes-HOME WITH MOM, UNCLE AND COUSIN  Length of Stay: Yes-14 DAYS  Discharge activity level: Yes-SUPERVISION-MOD/I LEVEL  Home/community participation: Yes  Services provided included: MD, RD, PT, OT, RN, CM, TR, Pharmacy, Neuropsych and SW  Financial Services: Medicare and Medicaid  Follow-up services arranged: Home Health: [place] [facility]-PT,OT,RN,AIDE, DME: [place] and Patient/Family request agency HH: ACTIVE PT WITH [facility], DME: PREF  Comments (or additional information):PT REACHED GOALS AND DID WELL. DID FALL WHILE HERE AND IS AWARE SHE WILL BE AT RISK TO FALL AT HOME. FAMILY WILL BE THERE BUT UNABLE TO ASSIST HER DUE TO OWN HEALTH ISSUES. RAMP BEING BUILT NEED TO GO HOME VIA AMBULANCE UNTIL ABLE TO AMBULATE UP AND DOWN RAMP  Patient/Family verbalized understanding of follow-up arrangements: Yes  Individual responsible for coordination of the follow-up plan: SELF   Confirmed correct DME delivered: Elease Hashimoto 04/24/2017    Elease Hashimoto

## 2017-04-24 NOTE — Progress Notes (Signed)
Physical Therapy Discharge Summary  Patient Details  Name: [name] [name] MRN: [id number] Date of Birth: Jun 15, 1975  Today's Date: 04/24/2017 PT Individual Time: 0915-1000, 1300-1345 PT Individual Time Calculation (min): 45 min , 45 min    Patient has met 7 of 7 long term goals due to improved activity tolerance, improved balance, increased strength and decreased pain.  Patient to discharge at a wheelchair level Modified Independent. The patient is discharging at a Mod I level however therapy has recommended supervision at home during ambulation and car transfers for safety. This therapist has discussed the importance of minimizing ambulation at home for preservation of her sound limb, safety and endurance secondary to her obesity and decreased activity tolerance.    All goals met.   Recommendation:  Patient will benefit from ongoing skilled PT services in home health setting to continue to advance safe functional mobility, address ongoing impairments in balance, ambulation, strength, and minimize fall risk.  Equipment: 20x 18 w/c with L amputee pad  Reasons for discharge: treatment goals met  Patient/family agrees with progress made and goals achieved: Yes   Treatment Interventions: Session 1: Pt seated in w/c upon PT arrival, agreeable to therapy tx and reports minimal pain (1/10 in L LE). Pt donned R shoe Mod I and propelled w/c from room>gym Mod I. Discharge summary completed this session with emphasis on safe transfers, HEP, and ambulation. Pt performed x 2 stand pivot transfers from w/c<>mat Mod I, and performed x 2 squat pivot transfers from w/c<>mat Mod I. Pt able to set up transfers and perform w/c parts management Mod I. Pt ambulated x 20 ft this session using RW and supervision, verbal cues for techniques. Pt can ascend/descend a 2 inch step with B UE support and supervision, however she will not be able to ascend/descened stairs leading into her house. The pt is waiting on  a ramp to be installed and will take a non-medical ambulance transport home if the ramp is not yet in place, pt agreeable. Reviewed/performed HEP, reviewed limb wrapping. Pt propelled back to room and left seated in w/c with need in reach.   Session 2: Pt seated in w/c upon PT arrival, agreeable to therapy tx and denies pain. Pt propelled w/c within unit and navigated incline/decline, Mod I using B UEs and R LE. Pt performed L LE strengthening exercises: standing hip abduction, standing hip extension and seated LAQ, 2 x 10 each. Pt ambulated 2 x 15 ft with RW and supervision. Pt reported that her biggest concerns when she goes home does not involve getting around, transfers or gait but instead dealing with the amputation as a whole while also getting over a break-up, therapist provided emotional support. Pt practiced car transfer stand pivot using RW and supervision. Pt practiced x 2 squat pivot transfers to higher surface Mod I. Pt left seated in w/c at end of session with needs in reach.    PT Discharge Precautions/Restrictions Precautions Precautions: Fall Precaution Comments: L BKA,  Restrictions Weight Bearing Restrictions: Yes LLE Weight Bearing: Non weight bearing Other Position/Activity Restrictions: Recent BKA on LLE; wound vac Pain Pain Assessment Pain Assessment: No/denies pain Pain Score: 0-No pain Pain Type: Acute pain Pain Location: Leg Pain Descriptors / Indicators: Aching Pain Frequency: Intermittent Pain Onset: On-going Patients Stated Pain Goal: 2 Pain Intervention(s): Medication (See eMAR) Cognition Overall Cognitive Status: Within Functional Limits for tasks assessed Arousal/Alertness: Awake/alert Orientation Level: Oriented X4 Attention: Sustained;Selective Sustained Attention: Appears intact Selective Attention: Appears intact Memory: Appears intact  Awareness: Appears intact Problem Solving: Appears intact Safety/Judgment: Appears  intact Sensation Sensation Light Touch: Appears Intact Proprioception: Appears Intact Additional Comments: Sensation grossly intact B LEs, some decreased sensation near L incision, pt also reports phantom pain Coordination Gross Motor Movements are Fluid and Coordinated: No Fine Motor Movements are Fluid and Coordinated: No Coordination and Movement Description: B LE coordination impaired secondary to L BKA Motor  Motor Motor - Skilled Clinical Observations: generalized weakness  Trunk/Postural Assessment  Cervical Assessment Cervical Assessment: Within Functional Limits Thoracic Assessment Thoracic Assessment: Exceptions to WFL(thoracic rounding) Lumbar Assessment Lumbar Assessment: Within Functional Limits Postural Control Postural Control: Within Functional Limits  Balance Balance Balance Assessed: Yes Static Sitting Balance Static Sitting - Level of Assistance: 6: Modified independent (Device/Increase time) Dynamic Sitting Balance Dynamic Sitting - Level of Assistance: 6: Modified independent (Device/Increase time) Static Standing Balance Static Standing - Level of Assistance: 6: Modified independent (Device/Increase time) Dynamic Standing Balance Dynamic Standing - Level of Assistance: 5: Stand by assistance Extremity Assessment  RLE Assessment RLE Assessment: Within Functional Limits LLE Assessment LLE Assessment: Exceptions to [facility] LLE Strength Left Hip Flexion: 4/5 Left Hip Extension: 4-/5 Left Hip ABduction: 4/5 Left Hip ADduction: 4/5 Left Knee Flexion: 3+/5 Left Knee Extension: 3+/5  See Function Navigator for Current Functional Status.  Netta Corrigan, PT, DPT 04/24/2017, 11:07 AM

## 2017-04-24 NOTE — Progress Notes (Signed)
Occupational Therapy Session Note  Patient Details  Name: [name] [name] MRN: [id number] Date of Birth: May 28, 1975  Today's Date: 04/24/2017 OT Individual Time: 2229-7989 OT Individual Time Calculation (min): 56 min    Short Term Goals: Week 2:  OT Short Term Goal 1 (Week 2): STGs equal to LTGs based on ELOS  Skilled Therapeutic Interventions/Progress Updates:    Pt completed bathing and dressing during session.  She was able to complete transfer from wheelchair to tub bench with supervision using the RW for support.  She completed at bathing sit to stand with modified independence using grab bars for support.  Once completed she dried off at modified independent level and transferred back to the wheelchair with supervision using the RW.  Min instructional cueing for stepping sideways instead of turning around and trying to hop backwards.  Grooming and dressing tasks at modified independent level as well from wheelchair.  Pt left in wheelchair with call button and phone in reach.    Therapy Documentation Precautions:  Precautions Precautions: Fall Precaution Comments: L BKA,  Restrictions Weight Bearing Restrictions: Yes LLE Weight Bearing: Non weight bearing Other Position/Activity Restrictions: Recent BKA on LLE; wound vac  Pain: Pain Assessment Pain Assessment: No/denies pain Pain Score: Asleep ADL: See Function Navigator for Current Functional Status.   Therapy/Group: Individual Therapy  Makaelyn Aponte OTR/L 04/24/2017, 8:57 AM

## 2017-04-24 NOTE — Progress Notes (Signed)
Occupational Therapy Discharge Summary  Patient Details  Name: [name] [name] MRN: [id number] Date of Birth: [birth year]  Today's Date: 04/24/2017 OT Individual Time: 1401-1445 OT Individual Time Calculation (min): 44 min   Session Note:  Pt completed toilet transfer stand pivot with the RW from the wheelchair to the drop arm commode seated over the toilet. She was able to perform stand pivot transfer with the RW safely in both directions to get on and off of the toilet.  Emphasized the need to place toilet outside of the bathroom for safety secondary to not being able to easily fit in the bathroom with the walker based on the bathroom setup.  Pt reports she can place the 3:1 in her room if needed.  Next had pt complete washing hands at the sink with modified independence before rolling herself down to the dayroom.  Once in the dayroom she completed 2 sets of 5 mins each on the UE ergonometer.  RPMs maintained at 20-25 throughout both sets with resistance set on level 13.  Therapist assisted with taking pt back to the room where nursing was present at end of session.    Patient has met 9 of 10 long term goals due to improved activity tolerance, improved balance and ability to compensate for deficits.  Patient to discharge at overall Modified Independent level.  Patient's care partner unavailable to provide the necessary physical assistance at discharge.    Reasons goals not met: Pt needs supervision for dynamic standing balance  Recommendation:  Patient will benefit from ongoing skilled OT services in home health setting to continue to advance functional skills in the area of BADL.  Pt still needs continued work on dynamic standing balance and endurance as it relates to selfcare tasks and functional mobility.  Feel pt's environment may be limited for use of the wheelchair and she may be relying more on the RW, which is not as safe.  Feel pt needs supervision for functional mobility but can be  modified independent for just stand pivot transfers to the toilet.  Recommend HHOT further eval home setup and recommendations.    Equipment: wide drop arm commode, wheelchair, RW  Reasons for discharge: treatment goals met and discharge from hospital  Patient/family agrees with progress made and goals achieved: Yes  OT Discharge Precautions/Restrictions  Precautions Precautions: Fall Precaution Comments: L BKA,  Restrictions Weight Bearing Restrictions: Yes LLE Weight Bearing: Non weight bearing  Pain Pain Assessment Pain Assessment: 0-10 Pain Score: 6  Pain Type: Acute pain Pain Location: Leg Pain Orientation: Left Pain Descriptors / Indicators: Aching Pain Frequency: Intermittent Pain Onset: On-going Patients Stated Pain Goal: 2 Pain Intervention(s): Medication (See eMAR) ADL  See Function Section of chart for details  Vision Baseline Vision/History: Retinopathy Patient Visual Report: No change from baseline Vision Assessment?: No apparent visual deficits Perception  Perception: Within Functional Limits Praxis Praxis: Intact Cognition Overall Cognitive Status: Within Functional Limits for tasks assessed Arousal/Alertness: Awake/alert Orientation Level: Oriented X4 Attention: Sustained;Selective Safety/Judgment: Appears intact Sensation Sensation Light Touch: Impaired Detail Light Touch Impaired Details: Impaired RUE;Impaired LUE Stereognosis: Appears Intact Hot/Cold: Appears Intact Proprioception: Appears Intact Additional Comments: Pt reports slight tingling in the 1st and 2nd digits of both hands, at the fingertips. Coordination Gross Motor Movements are Fluid and Coordinated: Yes Fine Motor Movements are Fluid and Coordinated: Yes Coordination and Movement Description: BUE coordination WFLs Motor  Motor Motor - Discharge Observations: generalized weakness Mobility  Transfers Transfers: Sit to Stand;Stand to Sit Sit to Stand:  With upper extremity  assist;From chair/3-in-1;With armrests;From toilet;6: Modified independent (Device/Increase time) Stand to Sit: 6: Modified independent (Device/Increase time);With armrests;With upper extremity assist  Trunk/Postural Assessment  Cervical Assessment Cervical Assessment: Within Functional Limits Thoracic Assessment Thoracic Assessment: Exceptions to WFL(slight thoracic rounding) Lumbar Assessment Lumbar Assessment: Within Functional Limits Postural Control Postural Control: Within Functional Limits  Balance Balance Balance Assessed: Yes Dynamic Sitting Balance Dynamic Sitting - Balance Support: During functional activity Dynamic Sitting - Level of Assistance: 6: Modified independent (Device/Increase time) Static Standing Balance Static Standing - Balance Support: During functional activity Static Standing - Level of Assistance: 6: Modified independent (Device/Increase time) Dynamic Standing Balance Dynamic Standing - Balance Support: During functional activity Dynamic Standing - Level of Assistance: 5: Stand by assistance Extremity/Trunk Assessment RUE Assessment RUE Assessment: Within Functional Limits LUE Assessment LUE Assessment: Within Functional Limits   See Function Navigator for Current Functional Status.  Thresia Ramanathan OTR/L 04/24/2017, 4:09 PM

## 2017-04-24 NOTE — Plan of Care (Signed)
  RH SAFETY RH STG ADHERE TO SAFETY PRECAUTIONS W/ASSISTANCE/DEVICE Description STG Adhere to Safety Precautions With min Assistance/Device.  04/24/2017 7014 - Not Progressing by Lysbeth Penner D, RN  Does not accept assistance, pt independence has caused two falls

## 2017-04-24 NOTE — Progress Notes (Addendum)
[place] PHYSICAL MEDICINE & REHABILITATION     PROGRESS NOTE  Subjective/Complaints:  Pt seen sitting up in her chair this AM.  She had a fall over the weekend and again overnight on her stump.  She wants to leave tomorrow.   ROS: Denies nausea, vomiting, diarrhea, shortness of breath or chest pain   Objective: Vital Signs: Blood pressure 119/76, pulse 99, temperature 97.8 F (36.6 C), temperature source Oral, resp. rate 20, height 5\' 10"  (1.778 m), weight (!) 148.5 kg (327 lb 6.1 oz), last menstrual period 04/06/2017, SpO2 94 %. No results found. No results for input(s): WBC, HGB, HCT, PLT in the last 72 hours. No results for input(s): NA, K, CL, GLUCOSE, BUN, CREATININE, CALCIUM in the last 72 hours.  Invalid input(s): CO CBG (last 3)  Recent Labs    04/23/17 2050 04/24/17 0729 04/24/17 0903  GLUCAP 303* 212* 227*    Wt Readings from Last 3 Encounters:  04/24/17 (!) 148.5 kg (327 lb 6.1 oz)  04/07/17 (!) 159.6 kg (351 lb 13.7 oz)  03/29/17 (!) 159.6 kg (351 lb 13.7 oz)    Physical Exam:  BP 119/76 (BP Location: Left Arm)   Pulse 99   Temp 97.8 F (36.6 C) (Oral)   Resp 20   Ht 5\' 10"  (1.778 m)   Wt (!) 148.5 kg (327 lb 6.1 oz)   LMP 04/06/2017 (Exact Date)   SpO2 94%   BMI 46.97 kg/m  Gen: NAD. Vital signs reviewed. Obese. HENT: Normocephalic. Atraumatic. Eyes: EOM are normal.  no discharge.  Cardiovascular: RRR. No JVD   Respiratory:  CTA Bilaterally. Normal effort   GI: Bowel sounds are normal. She exhibits no distension.  Musculoskeletal: She exhibits edema (b/l LE) and tenderness (LLE).  Neurological: She is alert and oriented.  Motor: B/l UE/RLE 5/5 proximal to distal LLE: HF 4+/5 (pain inhibition, unchanged) Skin.  Incision with central area os sanguineous drainage Psychiatric: Flat  Assessment/Plan: 1. Functional deficits secondary to left transtibial amputation 04/07/2017 which require 3+ hours per day of interdisciplinary therapy in a  comprehensive inpatient rehab setting. Physiatrist is providing close team supervision and 24 hour management of active medical problems listed below. Physiatrist and rehab team continue to assess barriers to discharge/monitor patient progress toward functional and medical goals.  Function:  Bathing Bathing position   Position: Wheelchair/chair at sink  Bathing parts Body parts bathed by patient: Front perineal area, Right upper leg, Left upper leg, Right lower leg, Left lower leg, Buttocks, Right arm, Left arm, Chest, Abdomen, Back Body parts bathed by helper: Back  Bathing assist Assist Level: More than reasonable time      Upper Body Dressing/Undressing Upper body dressing Upper body dressing/undressing activity did not occur: N/A What is the patient wearing?: Pull over shirt/dress     Pull over shirt/dress - Perfomed by patient: Thread/unthread right sleeve, Thread/unthread left sleeve, Put head through opening, Pull shirt over trunk          Upper body assist Assist Level: More than reasonable time      Lower Body Dressing/Undressing Lower body dressing   What is the patient wearing?: Non-skid slipper socks     Pants- Performed by patient: Thread/unthread right pants leg, Thread/unthread left pants leg, Pull pants up/down   Non-skid slipper socks- Performed by patient: Don/doff right sock Non-skid slipper socks- Performed by helper: Don/doff right sock                  Lower body  assist Assist for lower body dressing: More than reasonable time      Toileting Toileting   Toileting steps completed by patient: Adjust clothing prior to toileting, Performs perineal hygiene, Adjust clothing after toileting Toileting steps completed by helper: Adjust clothing prior to toileting, Adjust clothing after toileting Toileting Assistive Devices: Grab bar or rail  Toileting assist Assist level: Touching or steadying assistance (Pt.75%)(due to 1 LOB)   Transfers Chair/bed  transfer   Chair/bed transfer method: Squat pivot, Stand pivot Chair/bed transfer assist level: No Help, no cues, assistive device, takes more than a reasonable amount of time Chair/bed transfer assistive device: Armrests, Medical sales representative Ambulation activity did not occur: Safety/medical concerns(medical (low hemoglobin))   Max distance: 10' Assist level: Supervision or verbal cues   Wheelchair   Type: Manual Max wheelchair distance: 15' Assist Level: Supervision or verbal cues  Cognition Comprehension Comprehension assist level: Follows complex conversation/direction with no assist  Expression Expression assist level: Expresses complex ideas: With no assist  Social Interaction Social Interaction assist level: Interacts appropriately with others - No medications needed.  Problem Solving Problem solving assist level: Solves basic problems with no assist  Memory Memory assist level: Recognizes or recalls 90% of the time/requires cueing < 10% of the time    Medical Problem List and Plan:  1. Decreased functional mobility secondary to left transtibial amputation 04/07/2017    Cont CIR   With fall overnight on stump with drainage, spoke to Ortho for eval   Notes reviewed 2. DVT Prophylaxis/Anticoagulation: SCD right lower extremity  3. Pain Management: Lyrica 100 mg increased to 3 times daily    oxycodone and Robaxin as needed  4. Mood: Elavil 50 mg daily at bedtime  5. Neuropsych: This patient is capable of making decisions on her own behalf.    Appreciate Neuropsych follow up- will need to monitor for exacerbation of bipolar symptoms 6. Skin/Wound Care: Routine skin checks     Changed her wound dressing to an oil immersion dressing with Kerlix and shrinker over top to be changed daily 7. Fluids/Electrolytes/Nutrition: Routine I&O's  8. MRSA PCR screening positive. Contact precautions  9. Diabetes mellitus and peripheral neuropathy. Hemoglobin A1c 9.0.    NovoLog  6 units 3 times a day   Lantus insulin 35U     Diabetic teaching    Invokana at 100mg  qam (on 300mg  at home), d/ced on 11/29 due to patient insistence of it causing amputation despite education CBG (last 3)  Recent Labs    04/23/17 2050 04/24/17 0729 04/24/17 0903  GLUCAP 303* 212* 227*    Elevated on 12/3, pt noncompliant with diet recs 10. ?CKD stage III.    Creatinine 1.12 on 11/29, elevation likely from diuretics   Labs pending   Encourage fluids   Continue to monitor 11. Hypertension.    Monitor with increased mobility   Vitals:   04/23/17 1400 04/24/17 0513  BP: (!) 120/59 119/76  Pulse: 100 99  Resp: 16 20  Temp: 97.8 F (36.6 C) 97.8 F (36.6 C)  SpO2: 95% 94%    Controlled on 12/3 12. Constipation. Laxative assistance  13. Hyperlipidemia. Lipitor  14. Morbid obesity   Body mass index is 46.97 kg/m.   Diet and exercise education   Will cont to encourage weight loss to increase endurance and promote overall health 15.  Hyponatremia: Resolved   Cont to monitor 16. ABLA   Hb 8.2 on 11/29, likely concentrated   Labs ordered  for Monday   Hemoccult stools pending   Cont to monitor 17. Peripheral edema   Chronic problem   Lasix increased to 40 mg twice a day on 11/22, changed to bumex on 11/26   CXR reviewed suggesting stable cardiomegaly   BNP WNL    Filed Weights   04/20/17 0622 04/22/17 0540 04/24/17 0500  Weight: (!) 142.7 kg (314 lb 9.5 oz) (!) 154.6 kg (340 lb 13.3 oz) (!) 148.5 kg (327 lb 6.1 oz)    Unreliable weights 18. Sleep disturbance   Trazodone prn      LOS (Days) 13  A FACE TO FACE EVALUATION WAS PERFORMED  Ankit Lorie Phenix 04/24/2017 9:06 AM

## 2017-04-25 DIAGNOSIS — W19XXXS Unspecified fall, sequela: Secondary | ICD-10-CM

## 2017-04-25 LAB — GLUCOSE, CAPILLARY
GLUCOSE-CAPILLARY: 213 mg/dL — AB (ref 65–99)
Glucose-Capillary: 200 mg/dL — ABNORMAL HIGH (ref 65–99)

## 2017-04-25 MED ORDER — INSULIN ASPART 100 UNIT/ML FLEXPEN: 6.0000 [IU] | PEN_INJECTOR | Freq: Three times a day (TID) | SUBCUTANEOUS | 1 refills | Status: DC

## 2017-04-25 MED ORDER — POLYSACCHARIDE IRON COMPLEX 150 MG PO CAPS: 150.0000 mg | ORAL_CAPSULE | Freq: Two times a day (BID) | ORAL | 0 refills | Status: DC

## 2017-04-25 MED ORDER — ASPIRIN 325 MG PO TBEC: 325.0000 mg | DELAYED_RELEASE_TABLET | Freq: Every day | ORAL | 0 refills | Status: DC

## 2017-04-25 MED ORDER — OXYCODONE HCL 10 MG PO TABS: 10.0000 mg | ORAL_TABLET | ORAL | 0 refills | Status: DC | PRN

## 2017-04-25 MED ORDER — DOCUSATE SODIUM 100 MG PO CAPS: 100.0000 mg | ORAL_CAPSULE | Freq: Two times a day (BID) | ORAL | 0 refills | Status: DC

## 2017-04-25 MED ORDER — PREGABALIN 100 MG PO CAPS: 100.0000 mg | ORAL_CAPSULE | Freq: Three times a day (TID) | ORAL | 0 refills | Status: DC

## 2017-04-25 MED ORDER — ACCU-CHEK AVIVA PLUS W/DEVICE KIT: 1.0000 | PACK | Freq: Four times a day (QID) | 1 refills | Status: DC

## 2017-04-25 MED ORDER — GLUCOSE BLOOD VI STRP: ORAL_STRIP | 3 refills | Status: DC

## 2017-04-25 MED ORDER — METHOCARBAMOL 500 MG PO TABS: 500.0000 mg | ORAL_TABLET | Freq: Four times a day (QID) | ORAL | 0 refills | Status: DC | PRN

## 2017-04-25 MED ORDER — ACCU-CHEK FASTCLIX LANCETS MISC: 12 refills | Status: DC

## 2017-04-25 MED ORDER — POLYETHYLENE GLYCOL 3350 17 G PO PACK: 17.0000 g | PACK | Freq: Every day | ORAL | 0 refills | Status: DC

## 2017-04-25 MED ORDER — INSULIN GLARGINE 100 UNITS/ML SOLOSTAR PEN: 35.0000 [IU] | PEN_INJECTOR | Freq: Every day | SUBCUTANEOUS | 11 refills | Status: DC

## 2017-04-25 MED ORDER — ATORVASTATIN CALCIUM 20 MG PO TABS: 20.0000 mg | ORAL_TABLET | Freq: Every day | ORAL | 11 refills | Status: DC

## 2017-04-25 MED ORDER — BUMETANIDE 1 MG PO TABS: 1.0000 mg | ORAL_TABLET | Freq: Two times a day (BID) | ORAL | 0 refills | Status: DC

## 2017-04-25 MED ORDER — AMITRIPTYLINE HCL 50 MG PO TABS: 50.0000 mg | ORAL_TABLET | Freq: Every day | ORAL | 3 refills | Status: DC

## 2017-04-25 NOTE — Discharge Summary (Signed)
Discharge summary job [id number]

## 2017-04-25 NOTE — Discharge Summary (Signed)
[name] [name], [name] [name]          ACCOUNT NO.:  [id number]  MEDICAL RECORD NO.:  [id number]  LOCATION:  55M                           FACILITY:  [facility]  PHYSICIAN:  Delice Lesch, MD        DATE OF BIRTH:  Oct 28, 1975  DATE OF ADMISSION:  04/11/2017 DATE OF DISCHARGE:  04/25/2017                              DISCHARGE SUMMARY   DISCHARGE DIAGNOSES: 1. Left transtibial amputation on April 07, 2017. 2. Sequential compression device, right lower extremity, for DVT     prophylaxis. 3. Pain management. 4. Mood. 5. Methicillin-resistant Staphylococcus aureus PCR screening positive. 6. Diabetes mellitus. 7. Peripheral neuropathy. 8. Hypertension. 9. Constipation. 10.Hyperlipidemia. 11.Morbid obesity. 12.Hyponatremia, resolved. 13.Acute blood loss anemia.  This is a 41 year old right-handed female with history of bipolar disorder, diabetes mellitus, hypertension, CKD stage 3, necrotizing fasciitis of the groin, and debridement of left 2nd toe, recent admission on March 24, 2017, to April 02, 2017, for sepsis secondary to lower extremity cellulitis.  She lives with disabled mother who also has an amputation.  She has a caregiver for her mother as well as her uncle and nephew.  Presented on April 07, 2017, with decreasing ischemic changes of left lower extremity as well as necrotic abscess.  Wound was not felt to be salvageable and underwent transtibial amputation on April 07, 2017, with placement of wound VAC per Dr. Sharol Given.  Hospital course, pain management.  MRSA PCR screening positive, maintained on contact precautions.  The patient was admitted for comprehensive rehab program.  PAST MEDICAL HISTORY:  See discharge diagnoses.  SOCIAL HISTORY:  Lives with disabled mother, uncle, and nephew.  She does have assistance.  FUNCTIONAL STATUS UPON ADMISSION TO REHAB SERVICES:  Minimal assist to ambulate 4 hops rolling walker, moderate assist sit to stand, mod to max assist  activities of daily living.  PHYSICAL EXAMINATION:  VITAL SIGNS:  Blood pressure 143/77, pulse 106, temperature 99, and respirations 16. GENERAL:  Alert female, somewhat anxious. HEENT:  EOMs intact. NECK:  Supple.  Nontender.  No JVD. CARDIAC:  Rate controlled. ABDOMEN:  Soft, nontender, obese. EXTREMITIES:  Amputation site dressed with wound VAC in place.  REHABILITATION HOSPITAL COURSE:  The patient was admitted to Inpatient Rehab Services.  Therapies initiated on a 3-hour daily basis, consisting of physical therapy, occupational therapy, and rehabilitation nursing. The following issues were addressed during the patient's rehabilitation stay.  Pertaining to [name] [name]'s left transtibial amputation on April 07, 2017, wound VAC had been discontinued.  The patient did have an unassisted fall after she refused assistance to get up to the bathroom.  She attempted to do this on her own.  She did strike her stump.  There was some bloody drainage.  Orthopedic Services was consulted to monitor wound.  Ace wrap with Kerlix was applied.  Drainage greatly improved.  She would follow up with Orthopedic Services.  Pain management with the use of oxycodone as well as Lyrica and Robaxin as needed.  Acute blood loss anemia, stable, latest hemoglobin 8.2. Followup CBC unchanged.  Blood sugars monitored.  Hemoglobin A1c of 9. Insulin therapy as directed.  Questionable medical compliance with her diabetes.  Blood pressures, no orthostatic changes.  She remained  on Bumex.  Contact precautions for MRSA PCR screening.  The patient received weekly collaborative interdisciplinary team conferences to discuss estimated length of stay, family teaching, any barriers to discharge.  Working with activity tolerance.  Showed increased improved balance, increased strength.  Discharged at a modified independent level, however, recommended supervision at home for ambulation and car transfers for her safety.   Discussed all issues of risk of falls.  She can Timmothy Sours her right shoe modified independent, propel her wheelchair modified independent, gather belongings for activities of daily living and homemaking.  DISCHARGE MEDICATIONS: 1. Elavil 50 mg p.o. at bedtime. 2. Aspirin 325 mg p.o. daily. 3. Lipitor 20 mg p.o. daily. 4. Bumex 1 mg p.o. b.i.d. 5. Colace 100 mg p.o. b.i.d. 6. Lantus insulin 35 units at bedtime. 7. NovoLog 6 units t.i.d. 8. Niferex 150 mg p.o. b.i.d. 9. MiraLAX daily. 10.Lyrica 100 mg p.o. t.i.d. 11.Robaxin 500 mg every 6 hours as needed muscle spasms. 12.Oxycodone 10 mg p.o. every 4 hours as needed pain.  DIET:  Her diet was regular.  FOLLOWUP:  She would follow up with Dr. Sharol Given, [place] in 2 weeks; Dr. Posey Pronto, [place] office, to call for appointment; Dr. Colbert Ewing, medical management.  SPECIAL INSTRUCTIONS:  Ace wrap with Kerlix/4 x 4 to stump, change daily. Dr. Sharol Given has ordered a extra large stump shrinker Monitor for any increased redness, drainage, or fever.     Lauraine Rinne, P.A.   ______________________________ Delice Lesch, MD    DA/MEDQ  D:  04/25/2017  T:  04/25/2017  Job:  [id number]  cc:   Dr. Colbert Ewing Delice Lesch, MD Newt Minion, MD

## 2017-04-25 NOTE — Discharge Instructions (Signed)
Inpatient Rehab Discharge Instructions  [place] Discharge date and time: No discharge date for patient encounter.   Activities/Precautions/ Functional Status: Activity: activity as tolerated Diet: diabetic diet Wound Care: keep wound clean and dry Functional status:  ___ No restrictions     ___ Walk up steps independently ___ 24/7 supervision/assistance   ___ Walk up steps with assistance ___ Intermittent supervision/assistance  ___ Bathe/dress independently ___ Walk with walker     _x__ Bathe/dress with assistance ___ Walk Independently    ___ Shower independently ___ Walk with assistance    ___ Shower with assistance ___ No alcohol     ___ Return to work/school ________  Special Instructions: Follow-up with Dr. Sharol Given 2 weeks for removal of staples  Ace wrap with Kerlix/4 x 4 to the amputation site change daily as needed monitor for any increased redness drainage or fever   COMMUNITY REFERRALS UPON DISCHARGE:    Home Health:   PT, OT, RN, [place]   Date of last service:04/25/2017   Medical Equipment/Items [place]   [phone]   GENERAL COMMUNITY RESOURCES FOR PATIENT/FAMILY: Support Groups:[facility] SUPPORT GROUP FIRST Tuesday @ [place]: 7:00 PM QUESTIONS CONTACT ROBIN [phone]  My questions have been answered and I understand these instructions. I will adhere to these goals and the provided educational materials after my discharge from the hospital.  Patient/Caregiver Signature _______________________________ Date __________  Clinician Signature _______________________________________ Date __________  Please bring this form and your medication list with you to all your follow-up doctor's appointments.

## 2017-04-25 NOTE — Progress Notes (Signed)
Patient ID: [name] [name], female   DOB: 05-Aug-1975, 41 y.o.   MRN: [id number] Patient has a small amount of serosanguineous drainage from the medial aspect of her left transtibial amputation.  The remainder of the incision is well approximated no ischemic changes no dehiscence.  Will have [employer]  apply a 4 extra-large stump shrinker.  Patient will need 4 x 4 gauze over the area of the drainage.  I will follow-up in the office in 2 weeks.  Patient will call if there is any questions after discharge

## 2017-04-25 NOTE — Progress Notes (Signed)
[facility] PHYSICAL MEDICINE & REHABILITATION     PROGRESS NOTE  Subjective/Complaints:  Pt seen sitting up in her chair this AM.  As I am entering the room, she states she is ready to go home and asks me when she can leave.  She states she was seen by Dr. Sharol Given this AM.  She would like to request ambulance transport home.   ROS: Denies nausea, vomiting, diarrhea, shortness of breath or chest pain   Objective: Vital Signs: Blood pressure 132/77, pulse (!) 101, temperature 98.6 F (37 C), temperature source Oral, resp. rate 16, height 5\' 10"  (1.778 m), weight (!) 148.5 kg (327 lb 4.8 oz), last menstrual period 04/06/2017, SpO2 100 %. No results found. Recent Labs    04/24/17 0839  WBC 7.0  HGB 8.2*  HCT 28.2*  PLT 309   Recent Labs    04/24/17 0839  NA 135  K 4.4  CL 97*  GLUCOSE 194*  BUN 31*  CREATININE 1.05*  CALCIUM 8.8*   CBG (last 3)  Recent Labs    04/24/17 1656 04/24/17 2118 04/25/17 0644  GLUCAP 193* 189* 213*    Wt Readings from Last 3 Encounters:  04/25/17 (!) 148.5 kg (327 lb 4.8 oz)  04/07/17 (!) 159.6 kg (351 lb 13.7 oz)  03/29/17 (!) 159.6 kg (351 lb 13.7 oz)    Physical Exam:  BP 132/77 (BP Location: Left Arm)   Pulse (!) 101   Temp 98.6 F (37 C) (Oral)   Resp 16   Ht 5\' 10"  (1.778 m)   Wt (!) 148.5 kg (327 lb 4.8 oz)   LMP 04/06/2017 (Exact Date)   SpO2 100%   BMI 46.96 kg/m  Gen: NAD. Vital signs reviewed. Obese. HENT: Normocephalic. Atraumatic. Eyes: EOM are normal.  no discharge.  Cardiovascular: RRR. No JVD   Respiratory:  CTA Bilaterally. Normal effort   GI: Bowel sounds are normal. She exhibits no distension.  Musculoskeletal: She exhibits edema (b/l LE) and tenderness (LLE).  Neurological: She is alert and oriented.  Motor: B/l UE/RLE 5/5 proximal to distal LLE: HF 4+/5 (pain inhibition, stable) Skin.  Incision with central area os sanguineous drainage Psychiatric: Flat  Assessment/Plan: 1. Functional deficits secondary  to left transtibial amputation 04/07/2017 which require 3+ hours per day of interdisciplinary therapy in a comprehensive inpatient rehab setting. Physiatrist is providing close team supervision and 24 hour management of active medical problems listed below. Physiatrist and rehab team continue to assess barriers to discharge/monitor patient progress toward functional and medical goals.  Function:  Bathing Bathing position   Position: Wheelchair/chair at sink  Bathing parts Body parts bathed by patient: Front perineal area, Right upper leg, Left upper leg, Right lower leg, Left lower leg, Buttocks, Right arm, Left arm, Chest, Abdomen, Back Body parts bathed by helper: Back  Bathing assist Assist Level: More than reasonable time      Upper Body Dressing/Undressing Upper body dressing Upper body dressing/undressing activity did not occur: N/A What is the patient wearing?: Pull over shirt/dress     Pull over shirt/dress - Perfomed by patient: Thread/unthread right sleeve, Thread/unthread left sleeve, Put head through opening, Pull shirt over trunk          Upper body assist Assist Level: More than reasonable time      Lower Body Dressing/Undressing Lower body dressing   What is the patient wearing?: Non-skid slipper socks     Pants- Performed by patient: Thread/unthread right pants leg, Thread/unthread left pants leg,  Pull pants up/down   Non-skid slipper socks- Performed by patient: Don/doff right sock Non-skid slipper socks- Performed by helper: Don/doff right sock                  Lower body assist Assist for lower body dressing: More than reasonable time      Toileting Toileting   Toileting steps completed by patient: Adjust clothing prior to toileting, Performs perineal hygiene, Adjust clothing after toileting Toileting steps completed by helper: Adjust clothing prior to toileting, Adjust clothing after toileting, Performs perineal hygiene Toileting Assistive  Devices: Grab bar or rail  Toileting assist Assist level: More than reasonable time   Transfers Chair/bed transfer   Chair/bed transfer method: Squat pivot, Stand pivot Chair/bed transfer assist level: No Help, no cues, assistive device, takes more than a reasonable amount of time Chair/bed transfer assistive device: Armrests, Medical sales representative Ambulation activity did not occur: Safety/medical concerns(medical (low hemoglobin))   Max distance: 20 ft Assist level: Supervision or verbal cues   Wheelchair   Type: Manual Max wheelchair distance: >150 ft Assist Level: No help, No cues, assistive device, takes more than reasonable amount of time  Cognition Comprehension Comprehension assist level: Follows complex conversation/direction with no assist  Expression Expression assist level: Expresses complex ideas: With no assist  Social Interaction Social Interaction assist level: Interacts appropriately with others - No medications needed.  Problem Solving Problem solving assist level: Solves complex problems: Recognizes & self-corrects  Memory Memory assist level: Recognizes or recalls 90% of the time/requires cueing < 10% of the time    Medical Problem List and Plan:  1. Decreased functional mobility secondary to left transtibial amputation 04/07/2017    D/c today    Will see patient for transitional care management in 1-2 weeks   No further changes per Ortho after fall 2. DVT Prophylaxis/Anticoagulation: SCD right lower extremity  3. Pain Management: Lyrica 100 mg increased to 3 times daily    oxycodone and Robaxin as needed  4. Mood: Elavil 50 mg daily at bedtime  5. Neuropsych: This patient is capable of making decisions on her own behalf.    Appreciate Neuropsych follow up- will need to monitor for exacerbation of bipolar symptoms 6. Skin/Wound Care: Routine skin checks     Changed her wound dressing to an oil immersion dressing with Kerlix and shrinker over top  to be changed daily 7. Fluids/Electrolytes/Nutrition: Routine I&O's  8. MRSA PCR screening positive. Contact precautions  9. Diabetes mellitus and peripheral neuropathy. Hemoglobin A1c 9.0.    NovoLog 6 units 3 times a day   Lantus insulin 35U     Diabetic teaching    Invokana at 100mg  qam (on 300mg  at home), d/ced on 11/29 due to patient insistence of it causing amputation despite education CBG (last 3)  Recent Labs    04/24/17 1656 04/24/17 2118 04/25/17 0644  GLUCAP 193* 189* 213*    Overall improved, but remains elevated, will need ambulatory adjustments and diet complaince 10. ?CKD stage III.    Creatinine 1.05 on 12/3, improving   Encourage fluids   Continue to monitor 11. Hypertension.    Monitor with increased mobility   Vitals:   04/24/17 1450 04/25/17 0500  BP:  132/77  Pulse: 100 (!) 101  Resp: 16 16  Temp: 99 F (37.2 C) 98.6 F (37 C)  SpO2: 100% 100%    Controlled on 12/4 12. Constipation. Laxative assistance  13. Hyperlipidemia. Lipitor  14. Morbid  obesity   Body mass index is 46.96 kg/m.   Diet and exercise education   Will cont to encourage weight loss to increase endurance and promote overall health 15.  Hyponatremia: Resolved   Cont to monitor 16. ABLA   Hb 8.2 on 12/3, stable   Hemoccult stools remain pending   Cont to monitor 17. Peripheral edema   Chronic problem   Lasix increased to 40 mg twice a day on 11/22, changed to bumex on 11/26   CXR reviewed suggesting stable cardiomegaly   BNP WNL    Filed Weights   04/22/17 0540 04/24/17 0500 04/25/17 0500  Weight: (!) 154.6 kg (340 lb 13.3 oz) (!) 148.5 kg (327 lb 6.1 oz) (!) 148.5 kg (327 lb 4.8 oz)    Unreliable weights 18. Sleep disturbance   Trazodone prn      LOS (Days) 14  A FACE TO FACE EVALUATION WAS PERFORMED  Ankit Lorie Phenix 04/25/2017 8:26 AM

## 2017-04-25 NOTE — Progress Notes (Signed)
Patient discharged with ambulance. All belongings packed, no questions.

## 2017-04-25 NOTE — Progress Notes (Signed)
Orthopedic Tech Progress Note Patient Details:  [name] [name] 1975/06/15 [id number]  Patient ID: [name], female   DOB: 1976/04/27, 41 y.o.   MRN: [id number]   [name] 04/25/2017, 9:17 AM Called in [employer] brace order; spoke with Caryl Pina

## 2017-04-26 ENCOUNTER — Telehealth: Payer: Self-pay

## 2017-04-26 ENCOUNTER — Other Ambulatory Visit: Payer: Self-pay | Admitting: Neurology

## 2017-04-26 NOTE — Telephone Encounter (Signed)
TRANSITIONAL CARE CALL  I called both home and mobile phone numbers and left a message because there was no answer.

## 2017-04-27 NOTE — NC FL2 (Signed)
[place] MEDICAID FL2 LEVEL OF CARE SCREENING TOOL     IDENTIFICATION  Patient Name: [name] [name] Birthdate: [birth year] Sex: female Admission Date (Current Location): 04/11/2017  [place] and [place] Number:  Kathleen Argue [id number] [place] and Address:  [facility] [place], [place] [address], [place], [place] [address]      Provider Number: [id number]  Attending Physician Name and Address:  No att. providers found  Relative Name and Phone Number:  Neta Mends [phone]    Current Level of Care: Home Recommended Level of Care: [place] Prior Approval Number:    Date Approved/Denied:   PASRR Number: [id number]  Discharge Plan: SNF    Current Diagnoses: Patient Active Problem List   Diagnosis Date Noted  . Fall   . Stage 3 chronic kidney disease ([place])   . Peripheral edema   . Unilateral complete BKA, left, sequela ([place])   . Poorly controlled type 2 diabetes mellitus with peripheral neuropathy ([place])   . Morbid obesity ([place])   . Status post below knee amputation, left ([place]) 04/11/2017  . Unilateral complete BKA, left, subsequent encounter ([place])   . Post-operative pain   . Bipolar affective disorder ([place])   . Type 2 diabetes mellitus with peripheral neuropathy (H[facility])   . Benign essential HTN   . OSA (obstructive sleep apnea)   . Super obese   . Leukocytosis   . Acute blood loss anemia   . Below knee amputation status, left ([place]) 04/07/2017  . Cutaneous abscess of left foot   . Adjustment disorder with depressed mood 03/26/2017  . Left leg cellulitis   . Severe sepsis ([place]) 03/24/2017  . Hyponatremia 03/24/2017  . Abnormal ECG 03/24/2017  . Sepsis ([place]) 03/24/2017  . Acute bilateral low back pain 02/13/2017  . Heart failure with preserved ejection fraction ([place]) 02/09/2017  . Jerking movements of extremities 12/08/2016  . Routine adult health maintenance 12/08/2016  . Chronic pain of right knee 09/08/2016  . Abdominal  muscle pain 09/08/2016  . Vertigo 08/05/2016  . Lightheadedness 07/15/2016  . CKD (chronic kidney disease) stage 2, GFR 60-89 ml/min 07/15/2016  . Bilateral lower extremity edema 05/13/2016  . OSA on CPAP 05/13/2016  . Vaginal pruritus 03/14/2016  . Morbid obesity due to excess calories ([place]) 11/27/2015  . Callus of foot 11/26/2015  . History of Low serum cortisol level ([place]) 01/07/2015  . Financial difficulty 04/25/2013  . Uncontrolled type 2 diabetes mellitus ([place]) 07/12/2012  . Necrotizing fasciitis s/p OR debridements 07/06/2012  . Carpal tunnel syndrome, bilateral 01/25/2012  . Diabetic peripheral neuropathy ([place]) 07/04/2011  . Anxiety and depression 06/02/2011  . History of abnormal cervical Pap smear 12/13/2007  . GERD 12/06/2007  . Hyperlipidemia 08/29/2007  . Essential hypertension 08/29/2007    Orientation RESPIRATION BLADDER Height & Weight     Self, Time, Situation, Place  Normal Continent Weight: (!) 327 lb 4.8 oz (148.5 kg) Height:  '5\' 10"'  (177.8 cm)  BEHAVIORAL SYMPTOMS/MOOD NEUROLOGICAL BOWEL NUTRITION STATUS      Continent Diet(heart healthy)  AMBULATORY STATUS COMMUNICATION OF NEEDS Skin   Limited Assist Verbally Surgical wounds                       Personal Care Assistance Level of Assistance  Bathing, Dressing Bathing Assistance: Limited assistance   Dressing Assistance: Limited assistance     Functional Limitations Info             SPECIAL CARE FACTORS FREQUENCY  PT (By licensed PT), OT (By licensed OT)     PT Frequency: 5x week OT Frequency: 5 x week            Contractures Contractures Info: Not present    Additional Factors Info  Code Status, Allergies Code Status Info: Full Code Allergies Info: Vancomycin           Current Medications (04/27/2017):  This is the current hospital active medication list No current facility-administered medications for this encounter.    Current Outpatient Medications  Medication Sig  Dispense Refill  . ACCU-CHEK FASTCLIX LANCETS MISC Check blood sugar 3 times a day before meals 102 each 12  . amitriptyline (ELAVIL) 50 MG tablet Take 1 tablet (50 mg total) by mouth at bedtime. 90 tablet 3  . aspirin EC 325 MG EC tablet Take 1 tablet (325 mg total) by mouth daily. 30 tablet 0  . atorvastatin (LIPITOR) 20 MG tablet Take 1 tablet (20 mg total) by mouth daily. 30 tablet 11  . Blood Glucose Monitoring Suppl (ACCU-CHEK AVIVA PLUS) w/Device KIT 1 each by Does not apply route 4 (four) times daily. 1 kit 1  . bumetanide (BUMEX) 1 MG tablet Take 1 tablet (1 mg total) by mouth 2 (two) times daily. 60 tablet 0  . docusate sodium (COLACE) 100 MG capsule Take 1 capsule (100 mg total) by mouth 2 (two) times daily. 10 capsule 0  . glucose blood (ACCU-CHEK AVIVA PLUS) test strip Check blood sugar 4 times a day before meals 150 each 3  . insulin aspart (NOVOLOG FLEXPEN) 100 UNIT/ML FlexPen Inject 6 Units into the skin 3 (three) times daily with meals. 15 mL 1  . insulin glargine (LANTUS) 100 unit/mL SOPN Inject 0.35 mLs (35 Units total) into the skin at bedtime. 15 mL 11  . iron polysaccharides (NIFEREX) 150 MG capsule Take 1 capsule (150 mg total) by mouth 2 (two) times daily before lunch and supper. 60 capsule 0  . methocarbamol (ROBAXIN) 500 MG tablet Take 1 tablet (500 mg total) by mouth every 6 (six) hours as needed for muscle spasms. 90 tablet 0  . oxyCODONE 10 MG TABS Take 1 tablet (10 mg total) by mouth every 4 (four) hours as needed for severe pain ((score 7 to 10)). 20 tablet 0  . polyethylene glycol (MIRALAX / GLYCOLAX) packet Take 17 g by mouth daily. 14 each 0  . prednisoLONE acetate (PRED FORTE) 1 % ophthalmic suspension Place 1 drop into the right eye 4 (four) times daily. 10 mL 0  . pregabalin (LYRICA) 100 MG capsule Take 1 capsule (100 mg total) by mouth 3 (three) times daily. 90 capsule 0  . VOLTAREN 1 % GEL Apply 4 grams to affected area (topically) 4 times daily 100 g 2      Discharge Medications: Please see discharge summary for a list of discharge medications.  Relevant Imaging Results:  Relevant Lab Results:   Additional Information SSN: [id number]  Syesha Thaw, Gardiner Rhyme, LCSW

## 2017-04-28 ENCOUNTER — Telehealth (INDEPENDENT_AMBULATORY_CARE_PROVIDER_SITE_OTHER): Payer: Self-pay | Admitting: Radiology

## 2017-04-28 DIAGNOSIS — Z89512 Acquired absence of left leg below knee: Secondary | ICD-10-CM | POA: Diagnosis not present

## 2017-04-28 DIAGNOSIS — E1142 Type 2 diabetes mellitus with diabetic polyneuropathy: Secondary | ICD-10-CM | POA: Diagnosis not present

## 2017-04-28 DIAGNOSIS — G4730 Sleep apnea, unspecified: Secondary | ICD-10-CM | POA: Diagnosis not present

## 2017-04-28 DIAGNOSIS — Z4789 Encounter for other orthopedic aftercare: Secondary | ICD-10-CM | POA: Diagnosis not present

## 2017-04-28 DIAGNOSIS — L03119 Cellulitis of unspecified part of limb: Secondary | ICD-10-CM | POA: Diagnosis not present

## 2017-04-28 DIAGNOSIS — I509 Heart failure, unspecified: Secondary | ICD-10-CM | POA: Diagnosis not present

## 2017-04-28 DIAGNOSIS — I10 Essential (primary) hypertension: Secondary | ICD-10-CM | POA: Diagnosis not present

## 2017-04-28 DIAGNOSIS — M6281 Muscle weakness (generalized): Secondary | ICD-10-CM | POA: Diagnosis not present

## 2017-04-28 DIAGNOSIS — E785 Hyperlipidemia, unspecified: Secondary | ICD-10-CM | POA: Diagnosis not present

## 2017-04-28 DIAGNOSIS — L97929 Non-pressure chronic ulcer of unspecified part of left lower leg with unspecified severity: Secondary | ICD-10-CM | POA: Diagnosis not present

## 2017-04-28 DIAGNOSIS — E0841 Diabetes mellitus due to underlying condition with diabetic mononeuropathy: Secondary | ICD-10-CM | POA: Diagnosis not present

## 2017-04-28 DIAGNOSIS — E119 Type 2 diabetes mellitus without complications: Secondary | ICD-10-CM | POA: Diagnosis not present

## 2017-04-28 DIAGNOSIS — G4733 Obstructive sleep apnea (adult) (pediatric): Secondary | ICD-10-CM | POA: Diagnosis not present

## 2017-04-28 DIAGNOSIS — T8789 Other complications of amputation stump: Secondary | ICD-10-CM | POA: Diagnosis not present

## 2017-04-28 DIAGNOSIS — F250 Schizoaffective disorder, bipolar type: Secondary | ICD-10-CM | POA: Diagnosis not present

## 2017-04-28 DIAGNOSIS — S88019A Complete traumatic amputation at knee level, unspecified lower leg, initial encounter: Secondary | ICD-10-CM | POA: Diagnosis not present

## 2017-04-28 DIAGNOSIS — G8911 Acute pain due to trauma: Secondary | ICD-10-CM | POA: Diagnosis not present

## 2017-04-28 DIAGNOSIS — D631 Anemia in chronic kidney disease: Secondary | ICD-10-CM | POA: Diagnosis not present

## 2017-04-28 NOTE — Telephone Encounter (Signed)
Holly RN from [facility] calling today wanting to let us know as a FYI that she had seen the patient yesterday. She states patients confidence has declined since been home, she has had multiple falls. While her leg is ok, their Education officer, museum is working on getting her into skilled nursing at [facility], hopefully she can stay there 2-3 weeks, so they can monitor her incision closely and have daily therapy to help build upper extremity strength.

## 2017-05-01 ENCOUNTER — Ambulatory Visit: Payer: Self-pay | Admitting: Neurology

## 2017-05-02 DIAGNOSIS — T8789 Other complications of amputation stump: Secondary | ICD-10-CM | POA: Diagnosis not present

## 2017-05-02 DIAGNOSIS — I10 Essential (primary) hypertension: Secondary | ICD-10-CM | POA: Diagnosis not present

## 2017-05-02 DIAGNOSIS — G4733 Obstructive sleep apnea (adult) (pediatric): Secondary | ICD-10-CM | POA: Diagnosis not present

## 2017-05-02 DIAGNOSIS — E1142 Type 2 diabetes mellitus with diabetic polyneuropathy: Secondary | ICD-10-CM | POA: Diagnosis not present

## 2017-05-03 ENCOUNTER — Encounter: Payer: Medicare Other | Attending: Physical Medicine & Rehabilitation | Admitting: Physical Medicine & Rehabilitation

## 2017-05-04 ENCOUNTER — Encounter: Payer: Self-pay | Admitting: Internal Medicine

## 2017-05-05 ENCOUNTER — Encounter: Payer: Self-pay | Admitting: Internal Medicine

## 2017-05-05 DIAGNOSIS — I509 Heart failure, unspecified: Secondary | ICD-10-CM | POA: Diagnosis not present

## 2017-05-05 DIAGNOSIS — G4730 Sleep apnea, unspecified: Secondary | ICD-10-CM | POA: Diagnosis not present

## 2017-05-05 DIAGNOSIS — I10 Essential (primary) hypertension: Secondary | ICD-10-CM | POA: Diagnosis not present

## 2017-05-05 DIAGNOSIS — E119 Type 2 diabetes mellitus without complications: Secondary | ICD-10-CM | POA: Diagnosis not present

## 2017-05-08 ENCOUNTER — Telehealth: Payer: Self-pay | Admitting: Endocrinology

## 2017-05-08 NOTE — Telephone Encounter (Signed)
She did not come in November as scheduled possibly from hospitalization.  She can get one box of Novolog with note to make appointment

## 2017-05-08 NOTE — Telephone Encounter (Signed)
I have filled one box for the patient with a note that she needs an appointment for any further refills.

## 2017-05-08 NOTE — Telephone Encounter (Signed)
Please advise if patient is still under your care? All diabetic medications have been filled by a different physician. Please advise.

## 2017-05-11 ENCOUNTER — Ambulatory Visit (INDEPENDENT_AMBULATORY_CARE_PROVIDER_SITE_OTHER): Payer: Medicare Other | Admitting: Orthopedic Surgery

## 2017-05-11 ENCOUNTER — Telehealth (INDEPENDENT_AMBULATORY_CARE_PROVIDER_SITE_OTHER): Payer: Self-pay | Admitting: Orthopedic Surgery

## 2017-05-11 ENCOUNTER — Encounter (INDEPENDENT_AMBULATORY_CARE_PROVIDER_SITE_OTHER): Payer: Self-pay | Admitting: Orthopedic Surgery

## 2017-05-11 DIAGNOSIS — I509 Heart failure, unspecified: Secondary | ICD-10-CM | POA: Diagnosis not present

## 2017-05-11 DIAGNOSIS — Z89512 Acquired absence of left leg below knee: Secondary | ICD-10-CM

## 2017-05-11 DIAGNOSIS — E11621 Type 2 diabetes mellitus with foot ulcer: Secondary | ICD-10-CM | POA: Diagnosis not present

## 2017-05-11 DIAGNOSIS — L03116 Cellulitis of left lower limb: Secondary | ICD-10-CM | POA: Diagnosis not present

## 2017-05-11 DIAGNOSIS — E1165 Type 2 diabetes mellitus with hyperglycemia: Secondary | ICD-10-CM | POA: Diagnosis not present

## 2017-05-11 DIAGNOSIS — I130 Hypertensive heart and chronic kidney disease with heart failure and stage 1 through stage 4 chronic kidney disease, or unspecified chronic kidney disease: Secondary | ICD-10-CM | POA: Diagnosis not present

## 2017-05-11 DIAGNOSIS — L97401 Non-pressure chronic ulcer of unspecified heel and midfoot limited to breakdown of skin: Secondary | ICD-10-CM | POA: Diagnosis not present

## 2017-05-11 MED ORDER — SILVER SULFADIAZINE 1 % EX CREA: 1.0000 "application " | TOPICAL_CREAM | Freq: Every day | CUTANEOUS | 0 refills | Status: DC

## 2017-05-11 MED ORDER — OXYCODONE-ACETAMINOPHEN 5-325 MG PO TABS: 1.0000 | ORAL_TABLET | Freq: Four times a day (QID) | ORAL | 0 refills | Status: DC | PRN

## 2017-05-11 NOTE — Telephone Encounter (Signed)
[place] with [facility] called needing verbal orders for twice a week for wet to dry dressing changes. The number to contact Ubaldo Glassing is [phone]

## 2017-05-11 NOTE — Progress Notes (Signed)
Post-Op Visit Note   Patient: [name] [name]           Date of Birth: Aug 09, 1975           MRN: [id number] Visit Date: 05/11/2017 PCP: Colbert Ewing, MD  Chief Complaint:  Chief Complaint  Patient presents with  . Left Leg - Routine Post Op    04/07/17 L BKA    HPI:  HPI The patient is a 41 year old woman seen 34 days status post left below knee amputation. Has had some dehiscence medially. Has been packing open with wet to dry dressings.   Ortho Exam On examination majority of L BKA is well healed. Staples in place. Medially has an area that is 2 cm x 1 cm and 8 mm deep that has dehisced. Bloody drainage. No odor. No surrounding erythema. No sign of infection.  Visit Diagnoses: No diagnosis found.  Plan: staples harvested today. Continue to pack open with gauze. May do silvadene dressings, pack open. Provided order for prosthesis. Continue shrinker daily. Sent to [facility] per patient request.   Follow-Up Instructions: No Follow-up on file.   Imaging: No results found.  Orders:  No orders of the defined types were placed in this encounter.  No orders of the defined types were placed in this encounter.    PMFS History: Patient Active Problem List   Diagnosis Date Noted  . Fall   . Stage 3 chronic kidney disease ([place])   . Peripheral edema   . Unilateral complete BKA, left, sequela ([place])   . Poorly controlled type 2 diabetes mellitus with peripheral neuropathy ([place])   . Morbid obesity ([place])   . Status post below knee amputation, left ([place]) 04/11/2017  . Unilateral complete BKA, left, subsequent encounter ([place])   . Post-operative pain   . Bipolar affective disorder ([place])   . Type 2 diabetes mellitus with peripheral neuropathy (HCC)   . Benign essential HTN   . OSA (obstructive sleep apnea)   . Super obese   . Leukocytosis   . Acute blood loss anemia   . Below knee amputation status, left ([place]) 04/07/2017  . Cutaneous abscess of left foot   . Adjustment  disorder with depressed mood 03/26/2017  . Left leg cellulitis   . Severe sepsis ([place]) 03/24/2017  . Hyponatremia 03/24/2017  . Abnormal ECG 03/24/2017  . Sepsis ([place]) 03/24/2017  . Acute bilateral low back pain 02/13/2017  . Heart failure with preserved ejection fraction ([place]) 02/09/2017  . Jerking movements of extremities 12/08/2016  . Routine adult health maintenance 12/08/2016  . Chronic pain of right knee 09/08/2016  . Abdominal muscle pain 09/08/2016  . Vertigo 08/05/2016  . Lightheadedness 07/15/2016  . CKD (chronic kidney disease) stage 2, GFR 60-89 ml/min 07/15/2016  . Bilateral lower extremity edema 05/13/2016  . OSA on CPAP 05/13/2016  . Vaginal pruritus 03/14/2016  . Morbid obesity due to excess calories ([place]) 11/27/2015  . Callus of foot 11/26/2015  . History of Low serum cortisol level ([place]) 01/07/2015  . Financial difficulty 04/25/2013  . Uncontrolled type 2 diabetes mellitus ([place]) 07/12/2012  . Necrotizing fasciitis s/p OR debridements 07/06/2012  . Carpal tunnel syndrome, bilateral 01/25/2012  . Diabetic peripheral neuropathy ([place]) 07/04/2011  . Anxiety and depression 06/02/2011  . History of abnormal cervical Pap smear 12/13/2007  . GERD 12/06/2007  . Hyperlipidemia 08/29/2007  . Essential hypertension 08/29/2007   Past Medical History:  Diagnosis Date  . Abnormal Pap smear of cervix 2009  .  Abscess    history of multiple abscesses  . Anemia of chronic disease 2002  . Anxiety   . Bipolar disorder ([place])   . Cellulitis 05/21/2014   right eye  . Chronic bronchitis ([place])    "get it q yr" (05/13/2013)  . Chronic pain   . Depression   . Edema of lower extremity   . Endocarditis 2002   subacute bacterial endocarditis.   . Family history of anesthesia complication    "my mom has a hard time coming out from under"  . Fibromyalgia   . GERD (gastroesophageal reflux disease)   . Heart murmur   . History of blood transfusion    "just low blood count"  (05/13/2013)  . Hyperlipidemia   . Hypertension   . Hypothyroidism   . Hypothyroidism, adult 03/21/2014  . Necrosis ([place])    and ulceration  . Obesity   . OSA on CPAP    does not wear CPAP  . Peripheral neuropathy   . Type II diabetes mellitus (HCC)     Family History  Problem Relation Age of Onset  . Heart failure Mother   . Diabetes Mother   . Kidney disease Mother   . Kidney disease Father   . Diabetes Father   . Diabetes Paternal Grandmother   . Heart failure Paternal Grandmother   . Other Other     Past Surgical History:  Procedure Laterality Date  . AMPUTATION Left 04/07/2017   Procedure: LEFT BELOW KNEE AMPUTATION;  Surgeon: Newt Minion, MD;  Location: [facility];  Service: Orthopedics;  Laterality: Left;  . INCISION AND DRAINAGE ABSCESS     multiple I&Ds  . INCISION AND DRAINAGE ABSCESS Left 07/09/2012   Procedure: DRESSING CHANGE, THIGH WOUND;  Surgeon: Harl Bowie, MD;  Location: [facility];  Service: General;  Laterality: Left;  . INCISION AND DRAINAGE OF WOUND Left 07/07/2012   Procedure: IRRIGATION AND DEBRIDEMENT WOUND;  Surgeon: Harl Bowie, MD;  Location: [facility];  Service: General;  Laterality: Left;  . INCISION AND DRAINAGE PERIRECTAL ABSCESS Left 07/14/2012   Procedure: DEBRIDEMENT OF SKIN & SOFT TISSUE; DRESSING CHANGE UNDER ANESTHESIA;  Surgeon: Gayland Curry, MD,FACS;  Location: [facility];  Service: General;  Laterality: Left;  . INCISION AND DRAINAGE PERIRECTAL ABSCESS Left 07/16/2012   Procedure: I&D Left Thigh;  Surgeon: Gwenyth Ober, MD;  Location: [facility];  Service: General;  Laterality: Left;  . INCISION AND DRAINAGE PERIRECTAL ABSCESS N/A 01/05/2015   Procedure: IRRIGATION AND DEBRIDEMENT PERIRECTAL ABSCESS;  Surgeon: Donnie Mesa, MD;  Location: [facility];  Service: General;  Laterality: N/A;  . IRRIGATION AND DEBRIDEMENT ABSCESS Left 07/06/2012   Procedure: IRRIGATION AND DEBRIDEMENT ABSCESS BUTTOCKS AND THIGH;  Surgeon: Shann Medal, MD;  Location: [facility];  Service: General;  Laterality: Left;  . IRRIGATION AND DEBRIDEMENT ABSCESS Left 08/10/2012   Procedure: IRRIGATION AND DEBRIDEMENT ABSCESS;  Surgeon: Madilyn Hook, DO;  Location: [facility];  Service: General;  Laterality: Left;  . TONSILLECTOMY  1994   Social History   Occupational History  . Occupation: disability  Tobacco Use  . Smoking status: Current Every Day Smoker    Packs/day: 0.50    Years: 6.00    Pack years: 3.00    Types: Cigarettes  . Smokeless tobacco: Never Used  . Tobacco comment: Cutting back  Substance and Sexual Activity  . Alcohol use: Yes    Alcohol/week: 0.0 oz    Comment: Socially.  . Drug use:  No  . Sexual activity: Yes

## 2017-05-11 NOTE — Addendum Note (Signed)
Addended by: Dondra Prader R on: 05/11/2017 10:44 AM   Modules accepted: Orders

## 2017-05-12 NOTE — Telephone Encounter (Signed)
Called to advise ok to continue current wound care, they will teach patient dressing changes to have her do twice a day and they will go out twice a week.

## 2017-05-15 DIAGNOSIS — E11621 Type 2 diabetes mellitus with foot ulcer: Secondary | ICD-10-CM | POA: Diagnosis not present

## 2017-05-15 DIAGNOSIS — I509 Heart failure, unspecified: Secondary | ICD-10-CM | POA: Diagnosis not present

## 2017-05-15 DIAGNOSIS — I130 Hypertensive heart and chronic kidney disease with heart failure and stage 1 through stage 4 chronic kidney disease, or unspecified chronic kidney disease: Secondary | ICD-10-CM | POA: Diagnosis not present

## 2017-05-15 DIAGNOSIS — L03116 Cellulitis of left lower limb: Secondary | ICD-10-CM | POA: Diagnosis not present

## 2017-05-15 DIAGNOSIS — L97401 Non-pressure chronic ulcer of unspecified heel and midfoot limited to breakdown of skin: Secondary | ICD-10-CM | POA: Diagnosis not present

## 2017-05-15 DIAGNOSIS — E1165 Type 2 diabetes mellitus with hyperglycemia: Secondary | ICD-10-CM | POA: Diagnosis not present

## 2017-05-24 ENCOUNTER — Telehealth (INDEPENDENT_AMBULATORY_CARE_PROVIDER_SITE_OTHER): Payer: Self-pay | Admitting: Orthopedic Surgery

## 2017-05-24 DIAGNOSIS — I509 Heart failure, unspecified: Secondary | ICD-10-CM | POA: Diagnosis not present

## 2017-05-24 DIAGNOSIS — I130 Hypertensive heart and chronic kidney disease with heart failure and stage 1 through stage 4 chronic kidney disease, or unspecified chronic kidney disease: Secondary | ICD-10-CM | POA: Diagnosis not present

## 2017-05-24 DIAGNOSIS — L03116 Cellulitis of left lower limb: Secondary | ICD-10-CM | POA: Diagnosis not present

## 2017-05-24 DIAGNOSIS — E11621 Type 2 diabetes mellitus with foot ulcer: Secondary | ICD-10-CM | POA: Diagnosis not present

## 2017-05-24 DIAGNOSIS — L97401 Non-pressure chronic ulcer of unspecified heel and midfoot limited to breakdown of skin: Secondary | ICD-10-CM | POA: Diagnosis not present

## 2017-05-24 DIAGNOSIS — E1165 Type 2 diabetes mellitus with hyperglycemia: Secondary | ICD-10-CM | POA: Diagnosis not present

## 2017-05-24 NOTE — Telephone Encounter (Signed)
[facility]  [phone]  Please call to discuss pt with Ubaldo Glassing at [facility]

## 2017-05-24 NOTE — Telephone Encounter (Signed)
Call Alexis back tomorrow after patients appointment. Patients mother just passed away over the holiday. History of falls, HHRN questions if this will heal due to hx of falls, will reevaluate after office visit tomorrow and will call Alexis back.

## 2017-05-25 ENCOUNTER — Ambulatory Visit (INDEPENDENT_AMBULATORY_CARE_PROVIDER_SITE_OTHER): Payer: Medicare Other | Admitting: Orthopedic Surgery

## 2017-05-25 ENCOUNTER — Encounter (INDEPENDENT_AMBULATORY_CARE_PROVIDER_SITE_OTHER): Payer: Self-pay | Admitting: Orthopedic Surgery

## 2017-05-25 DIAGNOSIS — T8781 Dehiscence of amputation stump: Secondary | ICD-10-CM

## 2017-05-25 DIAGNOSIS — Z89512 Acquired absence of left leg below knee: Secondary | ICD-10-CM

## 2017-05-25 DIAGNOSIS — IMO0002 Reserved for concepts with insufficient information to code with codable children: Secondary | ICD-10-CM

## 2017-05-25 NOTE — Telephone Encounter (Signed)
I called [phone] and sw Alexis to advise that pt is to continue to pack wound using silvadene and change dressing twice a day. She has need revision surgery will follow up in the office in 2 weeks to eval. May call sooner if things look like they are changing but at this point no signs of infection. She will continue to see the pt several times a week.

## 2017-05-25 NOTE — Progress Notes (Signed)
Post-Op Visit Note   Patient: [name] [name]           Date of Birth: Feb 15, 1976           MRN: [id number] Visit Date: 05/25/2017 PCP: Colbert Ewing, MD  Chief Complaint:  No chief complaint on file.   HPI:  HPI The patient is a 42 year old woman seen status post left below knee amputation on 11/16. Has had some dehiscence medially. Has been packing open with wet to dry dressings. HH assisting, patient doing own wound care other days.   No fever or chills.   Ortho Exam On examination majority of L BKA is well healed. Staples in place. Medially has an area that is 2 cm in diameter and 2 cm deep. No fibrinous tissue in wound bed. Probes towards tibia. Does not probe to bone. Serosanguinous drainage. No odor. No surrounding erythema. No sign of infection.  Visit Diagnoses: No diagnosis found.  Plan: Continue to pack open with gauze. May do silvadene dressings, pack open. Continue shrinker daily. Discussed possibility of needing revision surgery. No clinical signs of infection. Will continue with wound care. Re evaluate at next follow up.   Follow-Up Instructions: No Follow-up on file.   Imaging: No results found.  Orders:  No orders of the defined types were placed in this encounter.  No orders of the defined types were placed in this encounter.    PMFS History: Patient Active Problem List   Diagnosis Date Noted  . Fall   . Stage 3 chronic kidney disease ([place])   . Peripheral edema   . Poorly controlled type 2 diabetes mellitus with peripheral neuropathy ([place])   . Status post below knee amputation, left ([place]) 04/11/2017  . Post-operative pain   . Bipolar affective disorder ([place])   . Type 2 diabetes mellitus with peripheral neuropathy (HCC)   . Benign essential HTN   . OSA (obstructive sleep apnea)   . Super obese   . Leukocytosis   . Acute blood loss anemia   . Below knee amputation status, left ([place]) 04/07/2017  . Cutaneous abscess of left foot   .  Adjustment disorder with depressed mood 03/26/2017  . Severe sepsis ([place]) 03/24/2017  . Hyponatremia 03/24/2017  . Abnormal ECG 03/24/2017  . Sepsis ([place]) 03/24/2017  . Acute bilateral low back pain 02/13/2017  . Heart failure with preserved ejection fraction ([place]) 02/09/2017  . Jerking movements of extremities 12/08/2016  . Routine adult health maintenance 12/08/2016  . Chronic pain of right knee 09/08/2016  . Abdominal muscle pain 09/08/2016  . Vertigo 08/05/2016  . Lightheadedness 07/15/2016  . CKD (chronic kidney disease) stage 2, GFR 60-89 ml/min 07/15/2016  . Bilateral lower extremity edema 05/13/2016  . OSA on CPAP 05/13/2016  . Vaginal pruritus 03/14/2016  . Morbid obesity due to excess calories ([place]) 11/27/2015  . Callus of foot 11/26/2015  . History of Low serum cortisol level ([place]) 01/07/2015  . Financial difficulty 04/25/2013  . Uncontrolled type 2 diabetes mellitus ([place]) 07/12/2012  . Necrotizing fasciitis s/p OR debridements 07/06/2012  . Carpal tunnel syndrome, bilateral 01/25/2012  . Diabetic peripheral neuropathy ([place]) 07/04/2011  . Anxiety and depression 06/02/2011  . History of abnormal cervical Pap smear 12/13/2007  . GERD 12/06/2007  . Hyperlipidemia 08/29/2007  . Essential hypertension 08/29/2007   Past Medical History:  Diagnosis Date  . Abnormal Pap smear of cervix 2009  . Abscess    history of multiple abscesses  . Anemia of  chronic disease 2002  . Anxiety   . Bipolar disorder ([place])   . Cellulitis 05/21/2014   right eye  . Chronic bronchitis ([place])    "get it q yr" (05/13/2013)  . Chronic pain   . Depression   . Edema of lower extremity   . Endocarditis 2002   subacute bacterial endocarditis.   . Family history of anesthesia complication    "my mom has a hard time coming out from under"  . Fibromyalgia   . GERD (gastroesophageal reflux disease)   . Heart murmur   . History of blood transfusion    "just low blood count" (05/13/2013)  .  Hyperlipidemia   . Hypertension   . Hypothyroidism   . Hypothyroidism, adult 03/21/2014  . Necrosis ([place])    and ulceration  . Obesity   . OSA on CPAP    does not wear CPAP  . Peripheral neuropathy   . Type II diabetes mellitus (HCC)     Family History  Problem Relation Age of Onset  . Heart failure Mother   . Diabetes Mother   . Kidney disease Mother   . Kidney disease Father   . Diabetes Father   . Diabetes Paternal Grandmother   . Heart failure Paternal Grandmother   . Other Other     Past Surgical History:  Procedure Laterality Date  . AMPUTATION Left 04/07/2017   Procedure: LEFT BELOW KNEE AMPUTATION;  Surgeon: Newt Minion, MD;  Location: [facility];  Service: Orthopedics;  Laterality: Left;  . INCISION AND DRAINAGE ABSCESS     multiple I&Ds  . INCISION AND DRAINAGE ABSCESS Left 07/09/2012   Procedure: DRESSING CHANGE, THIGH WOUND;  Surgeon: Harl Bowie, MD;  Location: [facility];  Service: General;  Laterality: Left;  . INCISION AND DRAINAGE OF WOUND Left 07/07/2012   Procedure: IRRIGATION AND DEBRIDEMENT WOUND;  Surgeon: Harl Bowie, MD;  Location: [facility];  Service: General;  Laterality: Left;  . INCISION AND DRAINAGE PERIRECTAL ABSCESS Left 07/14/2012   Procedure: DEBRIDEMENT OF SKIN & SOFT TISSUE; DRESSING CHANGE UNDER ANESTHESIA;  Surgeon: Gayland Curry, MD,FACS;  Location: [facility];  Service: General;  Laterality: Left;  . INCISION AND DRAINAGE PERIRECTAL ABSCESS Left 07/16/2012   Procedure: I&D Left Thigh;  Surgeon: Gwenyth Ober, MD;  Location: [facility];  Service: General;  Laterality: Left;  . INCISION AND DRAINAGE PERIRECTAL ABSCESS N/A 01/05/2015   Procedure: IRRIGATION AND DEBRIDEMENT PERIRECTAL ABSCESS;  Surgeon: Donnie Mesa, MD;  Location: [facility];  Service: General;  Laterality: N/A;  . IRRIGATION AND DEBRIDEMENT ABSCESS Left 07/06/2012   Procedure: IRRIGATION AND DEBRIDEMENT ABSCESS BUTTOCKS AND THIGH;  Surgeon: Shann Medal, MD;  Location: [facility];  Service:  General;  Laterality: Left;  . IRRIGATION AND DEBRIDEMENT ABSCESS Left 08/10/2012   Procedure: IRRIGATION AND DEBRIDEMENT ABSCESS;  Surgeon: Madilyn Hook, DO;  Location: [facility];  Service: General;  Laterality: Left;  . TONSILLECTOMY  1994   Social History   Occupational History  . Occupation: disability  Tobacco Use  . Smoking status: Current Every Day Smoker    Packs/day: 0.50    Years: 6.00    Pack years: 3.00    Types: Cigarettes  . Smokeless tobacco: Never Used  . Tobacco comment: Cutting back  Substance and Sexual Activity  . Alcohol use: Yes    Alcohol/week: 0.0 oz    Comment: Socially.  . Drug use: No  . Sexual activity: Yes

## 2017-05-26 ENCOUNTER — Telehealth: Payer: Self-pay | Admitting: *Deleted

## 2017-05-26 ENCOUNTER — Telehealth (INDEPENDENT_AMBULATORY_CARE_PROVIDER_SITE_OTHER): Payer: Self-pay | Admitting: Orthopedic Surgery

## 2017-05-26 DIAGNOSIS — I509 Heart failure, unspecified: Secondary | ICD-10-CM | POA: Diagnosis not present

## 2017-05-26 DIAGNOSIS — E1165 Type 2 diabetes mellitus with hyperglycemia: Secondary | ICD-10-CM | POA: Diagnosis not present

## 2017-05-26 DIAGNOSIS — L03116 Cellulitis of left lower limb: Secondary | ICD-10-CM | POA: Diagnosis not present

## 2017-05-26 DIAGNOSIS — L97401 Non-pressure chronic ulcer of unspecified heel and midfoot limited to breakdown of skin: Secondary | ICD-10-CM | POA: Diagnosis not present

## 2017-05-26 DIAGNOSIS — I130 Hypertensive heart and chronic kidney disease with heart failure and stage 1 through stage 4 chronic kidney disease, or unspecified chronic kidney disease: Secondary | ICD-10-CM | POA: Diagnosis not present

## 2017-05-26 DIAGNOSIS — E11621 Type 2 diabetes mellitus with foot ulcer: Secondary | ICD-10-CM | POA: Diagnosis not present

## 2017-05-26 NOTE — Telephone Encounter (Signed)
Belenda Cruise, a Physical Therapist with [facility], called requesting VO's for the following:  2x for next week 2x for 2 weeks 1x for 2 weeks  CB#[phone]

## 2017-05-26 NOTE — Telephone Encounter (Signed)
HHN, nickie, [facility] calls and states pt BP upon arrival this am 148/98 HR 101. Pt denies chest pain, shortness of breath, h/a, N&V, weakness or increased pain. HH waited appr 20 min and retook BP 150/98 HR 100. Pt states she cannot come for a visit until wed when she has transportation, appt given [facility] Wed 1015. She is cautioned if she has any of the already mentioned symptoms to call 911 or come to ED, she states agreement

## 2017-05-26 NOTE — Telephone Encounter (Signed)
Called and advised ok for orders for PT below.

## 2017-05-29 DIAGNOSIS — I509 Heart failure, unspecified: Secondary | ICD-10-CM | POA: Diagnosis not present

## 2017-05-29 DIAGNOSIS — I130 Hypertensive heart and chronic kidney disease with heart failure and stage 1 through stage 4 chronic kidney disease, or unspecified chronic kidney disease: Secondary | ICD-10-CM | POA: Diagnosis not present

## 2017-05-29 DIAGNOSIS — E11621 Type 2 diabetes mellitus with foot ulcer: Secondary | ICD-10-CM | POA: Diagnosis not present

## 2017-05-29 DIAGNOSIS — L97401 Non-pressure chronic ulcer of unspecified heel and midfoot limited to breakdown of skin: Secondary | ICD-10-CM | POA: Diagnosis not present

## 2017-05-29 DIAGNOSIS — E1165 Type 2 diabetes mellitus with hyperglycemia: Secondary | ICD-10-CM | POA: Diagnosis not present

## 2017-05-29 DIAGNOSIS — L03116 Cellulitis of left lower limb: Secondary | ICD-10-CM | POA: Diagnosis not present

## 2017-05-31 ENCOUNTER — Encounter: Payer: Self-pay | Admitting: Internal Medicine

## 2017-05-31 ENCOUNTER — Telehealth (INDEPENDENT_AMBULATORY_CARE_PROVIDER_SITE_OTHER): Payer: Self-pay | Admitting: Orthopedic Surgery

## 2017-05-31 ENCOUNTER — Ambulatory Visit (INDEPENDENT_AMBULATORY_CARE_PROVIDER_SITE_OTHER): Payer: Medicare Other | Admitting: Internal Medicine

## 2017-05-31 ENCOUNTER — Telehealth (INDEPENDENT_AMBULATORY_CARE_PROVIDER_SITE_OTHER): Payer: Self-pay | Admitting: Radiology

## 2017-05-31 ENCOUNTER — Other Ambulatory Visit: Payer: Self-pay

## 2017-05-31 VITALS — BP 121/66 | HR 100 | Temp 98.1°F | Ht 70.0 in | Wt 324.5 lb

## 2017-05-31 DIAGNOSIS — E1122 Type 2 diabetes mellitus with diabetic chronic kidney disease: Secondary | ICD-10-CM

## 2017-05-31 DIAGNOSIS — E1159 Type 2 diabetes mellitus with other circulatory complications: Secondary | ICD-10-CM

## 2017-05-31 DIAGNOSIS — R011 Cardiac murmur, unspecified: Secondary | ICD-10-CM | POA: Diagnosis not present

## 2017-05-31 DIAGNOSIS — Z89512 Acquired absence of left leg below knee: Secondary | ICD-10-CM | POA: Diagnosis not present

## 2017-05-31 DIAGNOSIS — E669 Obesity, unspecified: Secondary | ICD-10-CM

## 2017-05-31 DIAGNOSIS — E1165 Type 2 diabetes mellitus with hyperglycemia: Secondary | ICD-10-CM | POA: Diagnosis not present

## 2017-05-31 DIAGNOSIS — F17210 Nicotine dependence, cigarettes, uncomplicated: Secondary | ICD-10-CM

## 2017-05-31 DIAGNOSIS — Z8619 Personal history of other infectious and parasitic diseases: Secondary | ICD-10-CM

## 2017-05-31 DIAGNOSIS — G4733 Obstructive sleep apnea (adult) (pediatric): Secondary | ICD-10-CM

## 2017-05-31 DIAGNOSIS — N189 Chronic kidney disease, unspecified: Secondary | ICD-10-CM | POA: Diagnosis not present

## 2017-05-31 DIAGNOSIS — L03116 Cellulitis of left lower limb: Secondary | ICD-10-CM | POA: Diagnosis not present

## 2017-05-31 DIAGNOSIS — R000 Tachycardia, unspecified: Secondary | ICD-10-CM | POA: Diagnosis not present

## 2017-05-31 DIAGNOSIS — I509 Heart failure, unspecified: Secondary | ICD-10-CM | POA: Diagnosis not present

## 2017-05-31 DIAGNOSIS — Z6841 Body Mass Index (BMI) 40.0 and over, adult: Secondary | ICD-10-CM | POA: Diagnosis not present

## 2017-05-31 DIAGNOSIS — L97401 Non-pressure chronic ulcer of unspecified heel and midfoot limited to breakdown of skin: Secondary | ICD-10-CM | POA: Diagnosis not present

## 2017-05-31 DIAGNOSIS — I129 Hypertensive chronic kidney disease with stage 1 through stage 4 chronic kidney disease, or unspecified chronic kidney disease: Secondary | ICD-10-CM | POA: Diagnosis not present

## 2017-05-31 DIAGNOSIS — I130 Hypertensive heart and chronic kidney disease with heart failure and stage 1 through stage 4 chronic kidney disease, or unspecified chronic kidney disease: Secondary | ICD-10-CM | POA: Diagnosis not present

## 2017-05-31 DIAGNOSIS — I10 Essential (primary) hypertension: Principal | ICD-10-CM

## 2017-05-31 DIAGNOSIS — E11621 Type 2 diabetes mellitus with foot ulcer: Secondary | ICD-10-CM | POA: Diagnosis not present

## 2017-05-31 DIAGNOSIS — R002 Palpitations: Secondary | ICD-10-CM | POA: Diagnosis not present

## 2017-05-31 DIAGNOSIS — Z79899 Other long term (current) drug therapy: Secondary | ICD-10-CM

## 2017-05-31 DIAGNOSIS — I152 Hypertension secondary to endocrine disorders: Secondary | ICD-10-CM

## 2017-05-31 NOTE — Telephone Encounter (Signed)
[name] [name] with [facility] called needing verbal orders 2 wk 1 for stair training. The number to contact [name] [name] is [phone]

## 2017-05-31 NOTE — Progress Notes (Signed)
Internal Medicine Clinic Attending  Case discussed with Dr. Patel at the time of the visit.  We reviewed the resident's history and exam and pertinent patient test results.  I agree with the assessment, diagnosis, and plan of care documented in the resident's note.  

## 2017-05-31 NOTE — Telephone Encounter (Signed)
Called aw amber to advise verbal ok for the orders below.

## 2017-05-31 NOTE — Telephone Encounter (Signed)
Alexis from [facility] called she is with the patient now. Patient has what seems like pockets full of fluid. There is an odor and warmth, feelings for concern next follow up on 17th. Advised her to come in tomorrow, not able to come today due to transportation. Made appointment at 3pm.

## 2017-05-31 NOTE — Patient Instructions (Addendum)
It was a pleasure to see you [name] [name].  I am sorry about the recent stresses you are undergoing.  Your blood pressure is reassuring today. I will not make any changes at this time. Please continue your Bumex as prescribed.  Try to keep a log of your blood pressures at home and bring it with you on your next visit to see how they are doing at home.  Please follow up with [name] [name] on 07/20/17 or see [place] sooner if needed.   DASH Eating Plan DASH stands for "Dietary Approaches to Stop Hypertension." The DASH eating plan is a healthy eating plan that has been shown to reduce high blood pressure (hypertension). It may also reduce your risk for type 2 diabetes, heart disease, and stroke. The DASH eating plan may also help with weight loss. What are tips for following this plan? General guidelines  Avoid eating more than 2,300 mg (milligrams) of salt (sodium) a day. If you have hypertension, you may need to reduce your sodium intake to 1,500 mg a day.  Limit alcohol intake to no more than 1 drink a day for nonpregnant women and 2 drinks a day for men. One drink equals 12 oz of beer, 5 oz of wine, or 1 oz of hard liquor.  Work with your health care provider to maintain a healthy body weight or to lose weight. Ask what an ideal weight is for you.  Get at least 30 minutes of exercise that causes your heart to beat faster (aerobic exercise) most days of the week. Activities may include walking, swimming, or biking.  Work with your health care provider or diet and nutrition specialist (dietitian) to adjust your eating plan to your individual calorie needs. Reading food labels  Check food labels for the amount of sodium per serving. Choose foods with less than 5 percent of the Daily Value of sodium. Generally, foods with less than 300 mg of sodium per serving fit into this eating plan.  To find whole grains, look for the word "whole" as the first word in the ingredient list. Shopping  Buy  products labeled as "low-sodium" or "no salt added."  Buy fresh foods. Avoid canned foods and premade or frozen meals. Cooking  Avoid adding salt when cooking. Use salt-free seasonings or herbs instead of table salt or sea salt. Check with your health care provider or pharmacist before using salt substitutes.  Do not fry foods. Cook foods using healthy methods such as baking, boiling, grilling, and broiling instead.  Cook with heart-healthy oils, such as olive, canola, soybean, or sunflower oil. Meal planning   Eat a balanced diet that includes: ? 5 or more servings of fruits and vegetables each day. At each meal, try to fill half of your plate with fruits and vegetables. ? Up to 6-8 servings of whole grains each day. ? Less than 6 oz of lean meat, poultry, or fish each day. A 3-oz serving of meat is about the same size as a deck of cards. One egg equals 1 oz. ? 2 servings of low-fat dairy each day. ? A serving of nuts, seeds, or beans 5 times each week. ? Heart-healthy fats. Healthy fats called Omega-3 fatty acids are found in foods such as flaxseeds and coldwater fish, like sardines, salmon, and mackerel.  Limit how much you eat of the following: ? Canned or prepackaged foods. ? Food that is high in trans fat, such as fried foods. ? Food that is high in saturated fat,  such as fatty meat. ? Sweets, desserts, sugary drinks, and other foods with added sugar. ? Full-fat dairy products.  Do not salt foods before eating.  Try to eat at least 2 vegetarian meals each week.  Eat more home-cooked food and less restaurant, buffet, and fast food.  When eating at a restaurant, ask that your food be prepared with less salt or no salt, if possible. What foods are recommended? The items listed may not be a complete list. Talk with your dietitian about what dietary choices are best for you. Grains Whole-grain or whole-wheat bread. Whole-grain or whole-wheat pasta. Brown rice. [name].  Bulgur. Whole-grain and low-sodium cereals. Pita bread. Low-fat, low-sodium crackers. Whole-wheat flour tortillas. Vegetables Fresh or frozen vegetables (raw, steamed, roasted, or grilled). Low-sodium or reduced-sodium tomato and vegetable juice. Low-sodium or reduced-sodium tomato sauce and tomato paste. Low-sodium or reduced-sodium canned vegetables. Fruits All fresh, dried, or frozen fruit. Canned fruit in natural juice (without added sugar). Meat and other protein foods Skinless chicken or [place]. Ground chicken or [place]. Pork with fat trimmed off. Fish and seafood. Egg whites. Dried beans, peas, or lentils. Unsalted nuts, nut butters, and seeds. Unsalted canned beans. Lean cuts of beef with fat trimmed off. Low-sodium, lean deli meat. Dairy Low-fat (1%) or fat-free (skim) milk. Fat-free, low-fat, or reduced-fat cheeses. Nonfat, low-sodium ricotta or cottage cheese. Low-fat or nonfat yogurt. Low-fat, low-sodium cheese. Fats and oils Soft margarine without trans fats. Vegetable oil. Low-fat, reduced-fat, or light mayonnaise and salad dressings (reduced-sodium). Canola, safflower, olive, soybean, and sunflower oils. Avocado. Seasoning and other foods Herbs. Spices. Seasoning mixes without salt. Unsalted popcorn and pretzels. Fat-free sweets. What foods are not recommended? The items listed may not be a complete list. Talk with your dietitian about what dietary choices are best for you. Grains Baked goods made with fat, such as croissants, muffins, or some breads. Dry pasta or rice meal packs. Vegetables Creamed or fried vegetables. Vegetables in a cheese sauce. Regular canned vegetables (not low-sodium or reduced-sodium). Regular canned tomato sauce and paste (not low-sodium or reduced-sodium). Regular tomato and vegetable juice (not low-sodium or reduced-sodium). [name] [name]. Olives. Fruits Canned fruit in a light or heavy syrup. Fried fruit. Fruit in cream or butter sauce. Meat and other  protein foods Fatty cuts of meat. Ribs. Fried meat. [name] [name]. Sausage. Bologna and other processed lunch meats. Salami. Fatback. Hotdogs. Bratwurst. Salted nuts and seeds. Canned beans with added salt. Canned or smoked fish. Whole eggs or egg yolks. Chicken or [place] with skin. Dairy Whole or 2% milk, cream, and half-and-half. Whole or full-fat cream cheese. Whole-fat or sweetened yogurt. Full-fat cheese. Nondairy creamers. Whipped toppings. Processed cheese and cheese spreads. Fats and oils Butter. Stick margarine. Lard. Shortening. Ghee. Bacon fat. Tropical oils, such as coconut, palm kernel, or palm oil. Seasoning and other foods Salted popcorn and pretzels. Onion salt, garlic salt, seasoned salt, table salt, and sea salt. Worcestershire sauce. Tartar sauce. Barbecue sauce. Teriyaki sauce. Soy sauce, including reduced-sodium. Steak sauce. Canned and packaged gravies. Fish sauce. Oyster sauce. Cocktail sauce. Horseradish that you find on the shelf. Ketchup. Mustard. Meat flavorings and tenderizers. Bouillon cubes. Hot sauce and Tabasco sauce. Premade or packaged marinades. Premade or packaged taco seasonings. Relishes. Regular salad dressings. Where to find more information:  National Heart, Lung, and [place]: https://wilson-eaton.com/  American Heart Association: www.heart.org Summary  The DASH eating plan is a healthy eating plan that has been shown to reduce high blood pressure (hypertension). It may also reduce your  risk for type 2 diabetes, heart disease, and stroke.  With the DASH eating plan, you should limit salt (sodium) intake to 2,300 mg a day. If you have hypertension, you may need to reduce your sodium intake to 1,500 mg a day.  When on the DASH eating plan, aim to eat more fresh fruits and vegetables, whole grains, lean proteins, low-fat dairy, and heart-healthy fats.  Work with your health care provider or diet and nutrition specialist (dietitian) to adjust your eating plan to your  individual calorie needs. This information is not intended to replace advice given to you by your health care provider. Make sure you discuss any questions you have with your health care provider. Document Released: 04/28/2011 Document Revised: 05/02/2016 Document Reviewed: 05/02/2016 Elsevier Interactive Patient Education  [employer].

## 2017-05-31 NOTE — Progress Notes (Signed)
CC: HTN  HPI:  [name] [name] is a 42 y.o. female with PMH as listed below including HTN, DM, Necrosis of LLE s/p L BKA, OSA, CKD, and Obesity who presents for follow up management of her HTN. Please see problem based charting for status of patient's chronic medical issues.  Hypertension associated with diabetes [facility]) [name] presents for evaluation of her blood pressure. She is currently taking Bumex 1 mg BID. She has home health nursing check her blood pressure twice a week and occasionally sees readings in the 160s/95. She does report significant recent life stressors including her recent left BKA and the passing of her mother. She has noticed her heart racing twice in the last week when she is rushing to get to the bathroom, otherwise no feelings of her heart racing or skipping beats. She denies any lightheadedness, dizziness, or loss of consciousness. She feels she is producing an appropriate amount of urine for the amount of fluid intake she has. She does report a less than ideal diet due to her recent stress. BP Readings from Last 3 Encounters:  05/31/17 121/66  04/25/17 132/77  04/11/17 138/76   A/P: Her BP is controlled today at 121/66. Her high home BPs may be related to stress and/or checks after activity. Her symptoms of heart racing is likely due to deconditioning after her BKA as she says she now has to "hop" to get to the restroom. I have recommended she keep a log of her home BP checks and bring them with her on follow up with her PCP. We will continue Bumex 1 mg BID for now.   Past Medical History:  Diagnosis Date  . Abnormal Pap smear of cervix 2009  . Abscess    history of multiple abscesses  . Anemia of chronic disease 2002  . Anxiety   . Bipolar disorder ([place])   . Cellulitis 05/21/2014   right eye  . Chronic bronchitis ([place])    "get it q yr" (05/13/2013)  . Chronic pain   . Depression   . Edema of lower extremity   . Endocarditis 2002   subacute  bacterial endocarditis.   . Family history of anesthesia complication    "my mom has a hard time coming out from under"  . Fibromyalgia   . GERD (gastroesophageal reflux disease)   . Heart murmur   . History of blood transfusion    "just low blood count" (05/13/2013)  . Hyperlipidemia   . Hypertension   . Hypothyroidism   . Hypothyroidism, adult 03/21/2014  . Necrosis ([place])    and ulceration  . Obesity   . OSA on CPAP    does not wear CPAP  . Peripheral neuropathy   . Type II diabetes mellitus ([place])    Review of Systems:   Review of Systems  Respiratory: Negative for shortness of breath.   Cardiovascular: Negative for chest pain.       Occasional heart racing  Genitourinary: Negative for dysuria and frequency.  Musculoskeletal: Negative for falls.  Neurological: Negative for dizziness and loss of consciousness.     Physical Exam:  Vitals:   05/31/17 0948  BP: 121/66  Pulse: 100  Temp: 98.1 F (36.7 C)  TempSrc: Oral  SpO2: 100%  Weight: (!) 324 lb 8 oz (147.2 kg)  Height: 5\' 10"  (1.778 m)   Physical Exam  Constitutional: She is oriented to person, place, and time. She appears well-developed and well-nourished. No distress.  HENT:  Head: Normocephalic  and atraumatic.  Cardiovascular: Regular rhythm.  Borderline tachycardia, 2/6 systolic murmur RUS border  Pulmonary/Chest: Effort normal. No respiratory distress. She has no wheezes. She has no rales.  Musculoskeletal:  S/p L BKA. Trace edema both legs  Neurological: She is alert and oriented to person, place, and time.  Skin: Skin is warm. She is not diaphoretic.    Assessment & Plan:   See Encounters Tab for problem based charting.  Patient discussed with Dr. Dareen Piano

## 2017-05-31 NOTE — Assessment & Plan Note (Signed)
[name] [name] presents for evaluation of her blood pressure. She is currently taking Bumex 1 mg BID. She has home health nursing check her blood pressure twice a week and occasionally sees readings in the 160s/95. She does report significant recent life stressors including her recent left BKA and the passing of her mother. She has noticed her heart racing twice in the last week when she is rushing to get to the bathroom, otherwise no feelings of her heart racing or skipping beats. She denies any lightheadedness, dizziness, or loss of consciousness. She feels she is producing an appropriate amount of urine for the amount of fluid intake she has. She does report a less than ideal diet due to her recent stress. BP Readings from Last 3 Encounters:  05/31/17 121/66  04/25/17 132/77  04/11/17 138/76   A/P: Her BP is controlled today at 121/66. Her high home BPs may be related to stress and/or checks after activity. Her symptoms of heart racing is likely due to deconditioning after her BKA as she says she now has to "hop" to get to the restroom. I have recommended she keep a log of her home BP checks and bring them with her on follow up with her PCP. We will continue Bumex 1 mg BID for now.

## 2017-06-01 ENCOUNTER — Ambulatory Visit (INDEPENDENT_AMBULATORY_CARE_PROVIDER_SITE_OTHER): Payer: Medicare Other | Admitting: Family

## 2017-06-01 ENCOUNTER — Encounter (INDEPENDENT_AMBULATORY_CARE_PROVIDER_SITE_OTHER): Payer: Self-pay | Admitting: Family

## 2017-06-01 ENCOUNTER — Telehealth: Payer: Self-pay | Admitting: Internal Medicine

## 2017-06-01 DIAGNOSIS — L97401 Non-pressure chronic ulcer of unspecified heel and midfoot limited to breakdown of skin: Secondary | ICD-10-CM | POA: Diagnosis not present

## 2017-06-01 DIAGNOSIS — I509 Heart failure, unspecified: Secondary | ICD-10-CM | POA: Diagnosis not present

## 2017-06-01 DIAGNOSIS — T8781 Dehiscence of amputation stump: Secondary | ICD-10-CM

## 2017-06-01 DIAGNOSIS — E1165 Type 2 diabetes mellitus with hyperglycemia: Secondary | ICD-10-CM | POA: Diagnosis not present

## 2017-06-01 DIAGNOSIS — I130 Hypertensive heart and chronic kidney disease with heart failure and stage 1 through stage 4 chronic kidney disease, or unspecified chronic kidney disease: Secondary | ICD-10-CM | POA: Diagnosis not present

## 2017-06-01 DIAGNOSIS — E11621 Type 2 diabetes mellitus with foot ulcer: Secondary | ICD-10-CM | POA: Diagnosis not present

## 2017-06-01 DIAGNOSIS — L03116 Cellulitis of left lower limb: Secondary | ICD-10-CM | POA: Diagnosis not present

## 2017-06-01 DIAGNOSIS — Z89512 Acquired absence of left leg below knee: Secondary | ICD-10-CM

## 2017-06-01 NOTE — Progress Notes (Signed)
Post-Op Visit Note   Patient: [name] [name]           Date of Birth: [birth year]           MRN: [id number] Visit Date: 06/01/2017 PCP: Colbert Ewing, MD  Chief Complaint:  Chief Complaint  Patient presents with  . Left Leg - Wound Check    HPI:  HPI The patient is a 42 year old woman seen status post left below knee amputation on 04/07/17. Has had some dehiscence medially. Has been packing open. HH assisting, patient doing own wound care other days. HH concerned for worseing and warmth to BKA. Patient disappointed with lack of improvement and poor healing.   No fever or chills.   Ortho Exam On examination majority of L BKA is well healed. Staples in place. Medially has an area that has dehisced, this is full of drainage. is 2 cm in diameter and 2 cm deep. No fibrinous tissue in wound bed. Probes towards tibia 50mm.  Serosanguinous drainage. No odor. No surrounding erythema. No sign of infection. Laterally has an ulcerative area on incision is 3 mm in diameter, filled with exudative tissue, weeping serous drainage is 3 mm deep with probing.   Visit Diagnoses:  1. Status post below knee amputation, left ([place])   2. Dehiscence of amputation stump (HCC)     Plan: Continue to pack open with gauze. May do silvadene dressings, pack open. Continue shrinker daily.  Will plan for revision of Left BKA next Friday. Patient in agreement with plan.   Follow-Up Instructions: Return for 1 w post op.   Imaging: No results found.  Orders:  No orders of the defined types were placed in this encounter.  No orders of the defined types were placed in this encounter.    PMFS History: Patient Active Problem List   Diagnosis Date Noted  . Fall   . Stage 3 chronic kidney disease ([place])   . Peripheral edema   . Poorly controlled type 2 diabetes mellitus with peripheral neuropathy ([place])   . Status post below knee amputation, left ([place]) 04/11/2017  . Post-operative pain   . Bipolar  affective disorder ([place])   . Type 2 diabetes mellitus with peripheral neuropathy (HCC)   . OSA (obstructive sleep apnea)   . Super obese   . Leukocytosis   . Acute blood loss anemia   . Adjustment disorder with depressed mood 03/26/2017  . Severe sepsis ([place]) 03/24/2017  . Abnormal ECG 03/24/2017  . Acute bilateral low back pain 02/13/2017  . Heart failure with preserved ejection fraction ([place]) 02/09/2017  . Jerking movements of extremities 12/08/2016  . Routine adult health maintenance 12/08/2016  . Chronic pain of right knee 09/08/2016  . Abdominal muscle pain 09/08/2016  . Vertigo 08/05/2016  . Lightheadedness 07/15/2016  . CKD (chronic kidney disease) stage 2, GFR 60-89 ml/min 07/15/2016  . Bilateral lower extremity edema 05/13/2016  . OSA on CPAP 05/13/2016  . Vaginal pruritus 03/14/2016  . Morbid obesity due to excess calories ([place]) 11/27/2015  . Callus of foot 11/26/2015  . History of Low serum cortisol level ([place]) 01/07/2015  . Financial difficulty 04/25/2013  . Uncontrolled type 2 diabetes mellitus ([place]) 07/12/2012  . Necrotizing fasciitis s/p OR debridements 07/06/2012  . Carpal tunnel syndrome, bilateral 01/25/2012  . Diabetic peripheral neuropathy ([place]) 07/04/2011  . Anxiety and depression 06/02/2011  . History of abnormal cervical Pap smear 12/13/2007  . GERD 12/06/2007  . Hyperlipidemia 08/29/2007  . Hypertension associated  with diabetes ([place]) 08/29/2007   Past Medical History:  Diagnosis Date  . Abnormal Pap smear of cervix 2009  . Abscess    history of multiple abscesses  . Anemia of chronic disease 2002  . Anxiety   . Bipolar disorder ([place])   . Cellulitis 05/21/2014   right eye  . Chronic bronchitis ([place])    "get it q yr" (05/13/2013)  . Chronic pain   . Depression   . Edema of lower extremity   . Endocarditis 2002   subacute bacterial endocarditis.   . Family history of anesthesia complication    "my mom has a hard time coming out from under"  .  Fibromyalgia   . GERD (gastroesophageal reflux disease)   . Heart murmur   . History of blood transfusion    "just low blood count" (05/13/2013)  . Hyperlipidemia   . Hypertension   . Hypothyroidism   . Hypothyroidism, adult 03/21/2014  . Necrosis ([place])    and ulceration  . Obesity   . OSA on CPAP    does not wear CPAP  . Peripheral neuropathy   . Type II diabetes mellitus (HCC)     Family History  Problem Relation Age of Onset  . Heart failure Mother   . Diabetes Mother   . Kidney disease Mother   . Kidney disease Father   . Diabetes Father   . Diabetes Paternal Grandmother   . Heart failure Paternal Grandmother   . Other Other     Past Surgical History:  Procedure Laterality Date  . AMPUTATION Left 04/07/2017   Procedure: LEFT BELOW KNEE AMPUTATION;  Surgeon: Newt Minion, MD;  Location: [place];  Service: Orthopedics;  Laterality: Left;  . INCISION AND DRAINAGE ABSCESS     multiple I&Ds  . INCISION AND DRAINAGE ABSCESS Left 07/09/2012   Procedure: DRESSING CHANGE, THIGH WOUND;  Surgeon: Harl Bowie, MD;  Location: [place];  Service: General;  Laterality: Left;  . INCISION AND DRAINAGE OF WOUND Left 07/07/2012   Procedure: IRRIGATION AND DEBRIDEMENT WOUND;  Surgeon: Harl Bowie, MD;  Location: [place];  Service: General;  Laterality: Left;  . INCISION AND DRAINAGE PERIRECTAL ABSCESS Left 07/14/2012   Procedure: DEBRIDEMENT OF SKIN & SOFT TISSUE; DRESSING CHANGE UNDER ANESTHESIA;  Surgeon: Gayland Curry, MD,FACS;  Location: [place];  Service: General;  Laterality: Left;  . INCISION AND DRAINAGE PERIRECTAL ABSCESS Left 07/16/2012   Procedure: I&D Left Thigh;  Surgeon: Gwenyth Ober, MD;  Location: [place];  Service: General;  Laterality: Left;  . INCISION AND DRAINAGE PERIRECTAL ABSCESS N/A 01/05/2015   Procedure: IRRIGATION AND DEBRIDEMENT PERIRECTAL ABSCESS;  Surgeon: Donnie Mesa, MD;  Location: [place];  Service: General;  Laterality: N/A;  . IRRIGATION AND DEBRIDEMENT  ABSCESS Left 07/06/2012   Procedure: IRRIGATION AND DEBRIDEMENT ABSCESS BUTTOCKS AND THIGH;  Surgeon: Shann Medal, MD;  Location: [place];  Service: General;  Laterality: Left;  . IRRIGATION AND DEBRIDEMENT ABSCESS Left 08/10/2012   Procedure: IRRIGATION AND DEBRIDEMENT ABSCESS;  Surgeon: Madilyn Hook, DO;  Location: [place];  Service: General;  Laterality: Left;  . TONSILLECTOMY  1994   Social History   Occupational History  . Occupation: disability  Tobacco Use  . Smoking status: Current Every Day Smoker    Packs/day: 0.50    Years: 6.00    Pack years: 3.00    Types: Cigarettes  . Smokeless tobacco: Never Used  . Tobacco comment: Cutting back  Substance and  Sexual Activity  . Alcohol use: Yes    Alcohol/week: 0.0 oz    Comment: Socially.  . Drug use: No  . Sexual activity: Yes

## 2017-06-01 NOTE — Telephone Encounter (Signed)
Per Sharlet Salina at [facility] patient blood pressure was 161/101. Please call call back @ [phone].

## 2017-06-01 NOTE — Telephone Encounter (Signed)
Have called the [phone] 4 times, states # not in service Called both pt#'s went straight to vmail, lm for rtc

## 2017-06-02 DIAGNOSIS — I130 Hypertensive heart and chronic kidney disease with heart failure and stage 1 through stage 4 chronic kidney disease, or unspecified chronic kidney disease: Secondary | ICD-10-CM | POA: Diagnosis not present

## 2017-06-02 DIAGNOSIS — L03116 Cellulitis of left lower limb: Secondary | ICD-10-CM | POA: Diagnosis not present

## 2017-06-02 DIAGNOSIS — I509 Heart failure, unspecified: Secondary | ICD-10-CM | POA: Diagnosis not present

## 2017-06-02 DIAGNOSIS — E11621 Type 2 diabetes mellitus with foot ulcer: Secondary | ICD-10-CM | POA: Diagnosis not present

## 2017-06-02 DIAGNOSIS — L97401 Non-pressure chronic ulcer of unspecified heel and midfoot limited to breakdown of skin: Secondary | ICD-10-CM | POA: Diagnosis not present

## 2017-06-02 DIAGNOSIS — E1165 Type 2 diabetes mellitus with hyperglycemia: Secondary | ICD-10-CM | POA: Diagnosis not present

## 2017-06-02 NOTE — Telephone Encounter (Signed)
I think THN makes sense, anything we can do to work on medication compliance. Her BP was great in office two days ago. We can offer her an [facility] appointment to evaluate and manage depression if needed.

## 2017-06-02 NOTE — Telephone Encounter (Signed)
Call from [place] , [facility] - states pt's BP is 160/90, HR 110. Also states pt is depressed since loss of her mother last month and she has been non-compliant taking her medications. Suggest THN. Dr Posey Pronto saw pt 1/9. Has an appt with Dr Ronalee Red in Feb.

## 2017-06-02 NOTE — Telephone Encounter (Signed)
[name] [name], [facility] nurse stated she will talk to pt about scheduling an appt for discuss her depression. She will have pt to call if agreeable to schedule the appt; stated unsure if she would. Also stated she scheduled for surgery next Friday for revision of her stump.

## 2017-06-03 DIAGNOSIS — F4321 Adjustment disorder with depressed mood: Secondary | ICD-10-CM | POA: Diagnosis not present

## 2017-06-03 DIAGNOSIS — E1140 Type 2 diabetes mellitus with diabetic neuropathy, unspecified: Secondary | ICD-10-CM | POA: Diagnosis not present

## 2017-06-03 DIAGNOSIS — G4733 Obstructive sleep apnea (adult) (pediatric): Secondary | ICD-10-CM | POA: Diagnosis not present

## 2017-06-03 DIAGNOSIS — F319 Bipolar disorder, unspecified: Secondary | ICD-10-CM | POA: Diagnosis not present

## 2017-06-03 DIAGNOSIS — E1122 Type 2 diabetes mellitus with diabetic chronic kidney disease: Secondary | ICD-10-CM | POA: Diagnosis not present

## 2017-06-03 DIAGNOSIS — N183 Chronic kidney disease, stage 3 (moderate): Secondary | ICD-10-CM | POA: Diagnosis not present

## 2017-06-03 DIAGNOSIS — D631 Anemia in chronic kidney disease: Secondary | ICD-10-CM | POA: Diagnosis not present

## 2017-06-03 DIAGNOSIS — Z794 Long term (current) use of insulin: Secondary | ICD-10-CM | POA: Diagnosis not present

## 2017-06-03 DIAGNOSIS — E669 Obesity, unspecified: Secondary | ICD-10-CM | POA: Diagnosis not present

## 2017-06-03 DIAGNOSIS — Z89512 Acquired absence of left leg below knee: Secondary | ICD-10-CM | POA: Diagnosis not present

## 2017-06-03 DIAGNOSIS — E785 Hyperlipidemia, unspecified: Secondary | ICD-10-CM | POA: Diagnosis not present

## 2017-06-03 DIAGNOSIS — I509 Heart failure, unspecified: Secondary | ICD-10-CM | POA: Diagnosis not present

## 2017-06-03 DIAGNOSIS — Z4781 Encounter for orthopedic aftercare following surgical amputation: Secondary | ICD-10-CM | POA: Diagnosis not present

## 2017-06-03 DIAGNOSIS — I130 Hypertensive heart and chronic kidney disease with heart failure and stage 1 through stage 4 chronic kidney disease, or unspecified chronic kidney disease: Secondary | ICD-10-CM | POA: Diagnosis not present

## 2017-06-03 DIAGNOSIS — F419 Anxiety disorder, unspecified: Secondary | ICD-10-CM | POA: Diagnosis not present

## 2017-06-05 ENCOUNTER — Telehealth (INDEPENDENT_AMBULATORY_CARE_PROVIDER_SITE_OTHER): Payer: Self-pay | Admitting: Orthopedic Surgery

## 2017-06-05 NOTE — Telephone Encounter (Signed)
Patient called requesting pain medication. She states that [facility] was supposed to fax over an order for it. She just wants to see if you received this and if she can get an RX because she is having pain in her stump. Her CB # [phone]

## 2017-06-06 ENCOUNTER — Other Ambulatory Visit (INDEPENDENT_AMBULATORY_CARE_PROVIDER_SITE_OTHER): Payer: Self-pay | Admitting: Orthopedic Surgery

## 2017-06-06 ENCOUNTER — Telehealth (INDEPENDENT_AMBULATORY_CARE_PROVIDER_SITE_OTHER): Payer: Self-pay | Admitting: Orthopedic Surgery

## 2017-06-06 DIAGNOSIS — T8131XA Disruption of external operation (surgical) wound, not elsewhere classified, initial encounter: Secondary | ICD-10-CM

## 2017-06-06 MED ORDER — OXYCODONE-ACETAMINOPHEN 5-325 MG PO TABS: 1.0000 | ORAL_TABLET | Freq: Four times a day (QID) | ORAL | 0 refills | Status: DC | PRN

## 2017-06-06 NOTE — Telephone Encounter (Signed)
I called left vm to advise rx at front desk for pick up, she has been taking oxycodone for pain, we are not allowed to call or fax, has to printed rx to take to pharmacy.

## 2017-06-06 NOTE — Telephone Encounter (Signed)
Called to advised verbal ok per Dr. Sharol Given

## 2017-06-06 NOTE — Telephone Encounter (Signed)
[name] [name] with [facility] called requesting VO in the following:  1x for 8 weeks for wound care.  CB#[phone]

## 2017-06-06 NOTE — Telephone Encounter (Signed)
rx written

## 2017-06-07 DIAGNOSIS — N183 Chronic kidney disease, stage 3 (moderate): Secondary | ICD-10-CM | POA: Diagnosis not present

## 2017-06-07 DIAGNOSIS — Z4781 Encounter for orthopedic aftercare following surgical amputation: Secondary | ICD-10-CM | POA: Diagnosis not present

## 2017-06-07 DIAGNOSIS — E1122 Type 2 diabetes mellitus with diabetic chronic kidney disease: Secondary | ICD-10-CM | POA: Diagnosis not present

## 2017-06-07 DIAGNOSIS — I509 Heart failure, unspecified: Secondary | ICD-10-CM | POA: Diagnosis not present

## 2017-06-07 DIAGNOSIS — I130 Hypertensive heart and chronic kidney disease with heart failure and stage 1 through stage 4 chronic kidney disease, or unspecified chronic kidney disease: Secondary | ICD-10-CM | POA: Diagnosis not present

## 2017-06-07 DIAGNOSIS — D631 Anemia in chronic kidney disease: Secondary | ICD-10-CM | POA: Diagnosis not present

## 2017-06-07 NOTE — Telephone Encounter (Signed)
Per Epic, pt has an appt to see Dr Ronalee Red on 2/28.

## 2017-06-08 ENCOUNTER — Encounter (INDEPENDENT_AMBULATORY_CARE_PROVIDER_SITE_OTHER): Payer: Self-pay | Admitting: Orthopedic Surgery

## 2017-06-08 ENCOUNTER — Other Ambulatory Visit: Payer: Self-pay

## 2017-06-08 ENCOUNTER — Ambulatory Visit (INDEPENDENT_AMBULATORY_CARE_PROVIDER_SITE_OTHER): Payer: Medicare Other | Admitting: Orthopedic Surgery

## 2017-06-08 ENCOUNTER — Encounter (HOSPITAL_COMMUNITY): Payer: Self-pay | Admitting: *Deleted

## 2017-06-08 VITALS — Ht 70.0 in | Wt 324.0 lb

## 2017-06-08 DIAGNOSIS — Z89512 Acquired absence of left leg below knee: Secondary | ICD-10-CM

## 2017-06-08 DIAGNOSIS — T8781 Dehiscence of amputation stump: Secondary | ICD-10-CM

## 2017-06-08 MED ORDER — OXYCODONE-ACETAMINOPHEN 5-325 MG PO TABS: 1.0000 | ORAL_TABLET | Freq: Four times a day (QID) | ORAL | 0 refills | Status: DC | PRN

## 2017-06-08 NOTE — Progress Notes (Signed)
[name] [name] denies chest pain or shortness of breath.  Patient ambulates with a walker.  Patient report that CBG's usually run I the 100's , "I dont check it everyday. I instructed patient to check CBG after awaking and every 2 hours until arrival  to the hospital.  I Instructed patient if CBG is less than 70 to  Drink 1/2 cup of a clear juice. Recheck CBG in 15 minutes then call pre- op desk at [phone] for further instructions. If scheduled to receive Insulin, do not take Insulin.  If CBG 220 call pre- op desk and ask for instructions.

## 2017-06-08 NOTE — Progress Notes (Signed)
Office Visit Note   Patient: [name] [name]           Date of Birth: [birth year]           MRN: [id number] Visit Date: 06/08/2017              Requested by: Colbert Ewing, MD [address] [place], [place] [address] PCP: Colbert Ewing, MD  Chief Complaint  Patient presents with  . Left Leg - Pain    Pt is sch for a revision left bka for friday      HPI: Patient is a 42 year old woman status post left transtibial amputation.  Patient has had progressive dehiscence of the wound she has undergone conservative therapy with antibiotics packing the wound open with home health nursing.  Despite conservative care the wound continues to worsen deep and and has purulent drainage.  Assessment & Plan: Visit Diagnoses:  1. Status post below knee amputation, left ([place])   2. Dehiscence of amputation stump ([place])     Plan: We will plan for revision of the left transtibial amputation.  Patient is provided a prescription for Percocet for pain we will follow-up in the office in 1 week plan for outpatient surgery with a incisional wound VAC.  Follow-Up Instructions: Return in about 1 week (around 06/15/2017).   Ortho Exam  Patient is alert, oriented, no adenopathy, well-dressed, normal affect, normal respiratory effort. Examination patient does have significant venous and lymphatic swelling in the left lower extremity.  She has an open wound that is approximately 2 cm in diameter and approximately 3 cm deep.  There is purulent drainage and odor.  Imaging: No results found. No images are attached to the encounter.  Labs: Lab Results  Component Value Date   HGBA1C 9.0 (H) 03/25/2017   HGBA1C 11.3 (H) 11/21/2016   HGBA1C 8.1 08/05/2016   ESRSEDRATE 130 (H) 03/24/2017   ESRSEDRATE 53 (H) 09/14/2015   ESRSEDRATE 132 (H) 07/06/2012   CRP 5.4 (H) 09/14/2015   CRP 23.0 (H) 07/06/2012   REPTSTATUS 03/31/2017 FINAL 03/30/2017   GRAMSTAIN  07/29/2016    RARE WBC PRESENT, PREDOMINANTLY  PMN RARE GRAM POSITIVE COCCI IN PAIRS    CULT (A) 03/30/2017    <10,000 COLONIES/mL INSIGNIFICANT GROWTH Performed at [facility], [place] [address] [place], [place] [address]    [place] 05/21/2014    @LABSALLVALUES (HGBA1)@  Body mass index is 46.49 kg/m.  Orders:  No orders of the defined types were placed in this encounter.  No orders of the defined types were placed in this encounter.    Procedures: No procedures performed  Clinical Data: No additional findings.  ROS:  All other systems negative, except as noted in the HPI. Review of Systems  Objective: Vital Signs: Ht 5\' 10"  (1.778 m)   Wt (!) 324 lb (147 kg)   BMI 46.49 kg/m   Specialty Comments:  No specialty comments available.  PMFS History: Patient Active Problem List   Diagnosis Date Noted  . Fall   . Stage 3 chronic kidney disease ([place])   . Peripheral edema   . Poorly controlled type 2 diabetes mellitus with peripheral neuropathy ([place])   . Status post below knee amputation, left ([place]) 04/11/2017  . Post-operative pain   . Bipolar affective disorder ([place])   . Type 2 diabetes mellitus with peripheral neuropathy (HCC)   . OSA (obstructive sleep apnea)   . Super obese   . Leukocytosis   . Acute blood  loss anemia   . Adjustment disorder with depressed mood 03/26/2017  . Severe sepsis ([place]) 03/24/2017  . Abnormal ECG 03/24/2017  . Acute bilateral low back pain 02/13/2017  . Heart failure with preserved ejection fraction ([place]) 02/09/2017  . Jerking movements of extremities 12/08/2016  . Routine adult health maintenance 12/08/2016  . Chronic pain of right knee 09/08/2016  . Abdominal muscle pain 09/08/2016  . Vertigo 08/05/2016  . Lightheadedness 07/15/2016  . CKD (chronic kidney disease) stage 2, GFR 60-89 ml/min 07/15/2016  . Bilateral lower extremity edema 05/13/2016  . OSA on CPAP 05/13/2016  . Vaginal pruritus 03/14/2016  . Morbid obesity due to excess calories  ([place]) 11/27/2015  . Callus of foot 11/26/2015  . History of Low serum cortisol level ([place]) 01/07/2015  . Financial difficulty 04/25/2013  . Uncontrolled type 2 diabetes mellitus ([place]) 07/12/2012  . Necrotizing fasciitis s/p OR debridements 07/06/2012  . Carpal tunnel syndrome, bilateral 01/25/2012  . Diabetic peripheral neuropathy ([place]) 07/04/2011  . Anxiety and depression 06/02/2011  . History of abnormal cervical Pap smear 12/13/2007  . GERD 12/06/2007  . Hyperlipidemia 08/29/2007  . Hypertension associated with diabetes ([place]) 08/29/2007   Past Medical History:  Diagnosis Date  . Abnormal Pap smear of cervix 2009  . Abscess    history of multiple abscesses  . Anemia of chronic disease 2002  . Anxiety    Panic attacks  . Bipolar disorder ([place])   . Cellulitis 05/21/2014   right eye  . Chronic bronchitis ([place])    "get it q yr" (05/13/2013)  . Chronic pain   . Depression   . Edema of lower extremity   . Endocarditis 2002   subacute bacterial endocarditis.   . Family history of anesthesia complication    "my mom has a hard time coming out from under"  . Fibromyalgia   . GERD (gastroesophageal reflux disease)    occ  . Heart murmur   . History of blood transfusion    "just low blood count" (05/13/2013)  . Hyperlipidemia   . Hypertension   . Hypothyroidism   . Hypothyroidism, adult 03/21/2014  . Necrosis ([place])    and ulceration  . Obesity   . OSA on CPAP    does not wear CPAP  . Peripheral neuropathy   . Type II diabetes mellitus (HCC)    Type  II    Family History  Problem Relation Age of Onset  . Heart failure Mother   . Diabetes Mother   . Kidney disease Mother   . Kidney disease Father   . Diabetes Father   . Diabetes Paternal Grandmother   . Heart failure Paternal Grandmother   . Other Other     Past Surgical History:  Procedure Laterality Date  . AMPUTATION Left 04/07/2017   Procedure: LEFT BELOW KNEE AMPUTATION;  Surgeon: Newt Minion, MD;   Location: [place];  Service: Orthopedics;  Laterality: Left;  . EYE SURGERY     [place]  . INCISION AND DRAINAGE ABSCESS     multiple I&Ds  . INCISION AND DRAINAGE ABSCESS Left 07/09/2012   Procedure: DRESSING CHANGE, THIGH WOUND;  Surgeon: Harl Bowie, MD;  Location: [place];  Service: General;  Laterality: Left;  . INCISION AND DRAINAGE OF WOUND Left 07/07/2012   Procedure: IRRIGATION AND DEBRIDEMENT WOUND;  Surgeon: Harl Bowie, MD;  Location: [place];  Service: General;  Laterality: Left;  . INCISION AND DRAINAGE PERIRECTAL ABSCESS Left 07/14/2012   Procedure: DEBRIDEMENT  OF SKIN & SOFT TISSUE; DRESSING CHANGE UNDER ANESTHESIA;  Surgeon: Gayland Curry, MD,FACS;  Location: [place];  Service: General;  Laterality: Left;  . INCISION AND DRAINAGE PERIRECTAL ABSCESS Left 07/16/2012   Procedure: I&D Left Thigh;  Surgeon: Gwenyth Ober, MD;  Location: [place];  Service: General;  Laterality: Left;  . INCISION AND DRAINAGE PERIRECTAL ABSCESS N/A 01/05/2015   Procedure: IRRIGATION AND DEBRIDEMENT PERIRECTAL ABSCESS;  Surgeon: Donnie Mesa, MD;  Location: [place];  Service: General;  Laterality: N/A;  . IRRIGATION AND DEBRIDEMENT ABSCESS Left 07/06/2012   Procedure: IRRIGATION AND DEBRIDEMENT ABSCESS BUTTOCKS AND THIGH;  Surgeon: Shann Medal, MD;  Location: [place];  Service: General;  Laterality: Left;  . IRRIGATION AND DEBRIDEMENT ABSCESS Left 08/10/2012   Procedure: IRRIGATION AND DEBRIDEMENT ABSCESS;  Surgeon: Madilyn Hook, DO;  Location: [place];  Service: General;  Laterality: Left;  . TONSILLECTOMY  1994   Social History   Occupational History  . Occupation: disability  Tobacco Use  . Smoking status: Current Every Day Smoker    Packs/day: 0.50    Years: 15.00    Pack years: 7.50    Types: Cigarettes  . Smokeless tobacco: Never Used  . Tobacco comment: Cutting back.   Substance and Sexual Activity  . Alcohol use: Yes    Alcohol/week: 0.0 oz    Comment: Socially-" monthy  maybe"  . Drug  use: No  . Sexual activity: Yes

## 2017-06-09 ENCOUNTER — Inpatient Hospital Stay (HOSPITAL_COMMUNITY): Payer: Medicare Other | Admitting: Anesthesiology

## 2017-06-09 ENCOUNTER — Ambulatory Visit (HOSPITAL_COMMUNITY)
Admission: RE | Admit: 2017-06-09 | Discharge: 2017-06-09 | Disposition: A | Discharge status: Home / Self Care | Payer: Medicare Other | Source: Ambulatory Visit | Attending: Orthopedic Surgery | Admitting: Orthopedic Surgery

## 2017-06-09 ENCOUNTER — Encounter (HOSPITAL_COMMUNITY): Payer: Self-pay

## 2017-06-09 ENCOUNTER — Encounter (HOSPITAL_COMMUNITY)
Admission: RE | Disposition: A | Discharge status: Home / Self Care | Payer: Self-pay | Source: Ambulatory Visit | Attending: Orthopedic Surgery

## 2017-06-09 DIAGNOSIS — D649 Anemia, unspecified: Secondary | ICD-10-CM | POA: Insufficient documentation

## 2017-06-09 DIAGNOSIS — Z7982 Long term (current) use of aspirin: Secondary | ICD-10-CM | POA: Insufficient documentation

## 2017-06-09 DIAGNOSIS — Z794 Long term (current) use of insulin: Secondary | ICD-10-CM | POA: Insufficient documentation

## 2017-06-09 DIAGNOSIS — G4733 Obstructive sleep apnea (adult) (pediatric): Secondary | ICD-10-CM | POA: Diagnosis not present

## 2017-06-09 DIAGNOSIS — K219 Gastro-esophageal reflux disease without esophagitis: Secondary | ICD-10-CM | POA: Diagnosis not present

## 2017-06-09 DIAGNOSIS — E1151 Type 2 diabetes mellitus with diabetic peripheral angiopathy without gangrene: Secondary | ICD-10-CM | POA: Insufficient documentation

## 2017-06-09 DIAGNOSIS — E785 Hyperlipidemia, unspecified: Secondary | ICD-10-CM | POA: Diagnosis not present

## 2017-06-09 DIAGNOSIS — E039 Hypothyroidism, unspecified: Secondary | ICD-10-CM | POA: Insufficient documentation

## 2017-06-09 DIAGNOSIS — T8131XA Disruption of external operation (surgical) wound, not elsewhere classified, initial encounter: Secondary | ICD-10-CM

## 2017-06-09 DIAGNOSIS — F17210 Nicotine dependence, cigarettes, uncomplicated: Secondary | ICD-10-CM | POA: Insufficient documentation

## 2017-06-09 DIAGNOSIS — Y838 Other surgical procedures as the cause of abnormal reaction of the patient, or of later complication, without mention of misadventure at the time of the procedure: Secondary | ICD-10-CM | POA: Insufficient documentation

## 2017-06-09 DIAGNOSIS — T8781 Dehiscence of amputation stump: Secondary | ICD-10-CM | POA: Diagnosis not present

## 2017-06-09 DIAGNOSIS — E1142 Type 2 diabetes mellitus with diabetic polyneuropathy: Secondary | ICD-10-CM | POA: Diagnosis not present

## 2017-06-09 DIAGNOSIS — F329 Major depressive disorder, single episode, unspecified: Secondary | ICD-10-CM | POA: Insufficient documentation

## 2017-06-09 DIAGNOSIS — Z79899 Other long term (current) drug therapy: Secondary | ICD-10-CM | POA: Insufficient documentation

## 2017-06-09 DIAGNOSIS — G4730 Sleep apnea, unspecified: Secondary | ICD-10-CM | POA: Insufficient documentation

## 2017-06-09 DIAGNOSIS — M797 Fibromyalgia: Secondary | ICD-10-CM | POA: Insufficient documentation

## 2017-06-09 DIAGNOSIS — I10 Essential (primary) hypertension: Secondary | ICD-10-CM | POA: Diagnosis not present

## 2017-06-09 HISTORY — PX: STUMP REVISION: SHX6102

## 2017-06-09 LAB — CBC
HCT: 28.4 % — ABNORMAL LOW (ref 36.0–46.0)
Hemoglobin: 8.6 g/dL — ABNORMAL LOW (ref 12.0–15.0)
MCH: 23.5 pg — ABNORMAL LOW (ref 26.0–34.0)
MCHC: 30.3 g/dL (ref 30.0–36.0)
MCV: 77.6 fL — AB (ref 78.0–100.0)
PLATELETS: 392 10*3/uL (ref 150–400)
RBC: 3.66 MIL/uL — ABNORMAL LOW (ref 3.87–5.11)
RDW: 16.5 % — AB (ref 11.5–15.5)
WBC: 6.3 10*3/uL (ref 4.0–10.5)

## 2017-06-09 LAB — COMPREHENSIVE METABOLIC PANEL
ALT: 13 U/L — ABNORMAL LOW (ref 14–54)
AST: 13 U/L — AB (ref 15–41)
Albumin: 2.9 g/dL — ABNORMAL LOW (ref 3.5–5.0)
Alkaline Phosphatase: 130 U/L — ABNORMAL HIGH (ref 38–126)
Anion gap: 11 (ref 5–15)
BUN: 18 mg/dL (ref 6–20)
CHLORIDE: 103 mmol/L (ref 101–111)
CO2: 22 mmol/L (ref 22–32)
Calcium: 8.9 mg/dL (ref 8.9–10.3)
Creatinine, Ser: 1.03 mg/dL — ABNORMAL HIGH (ref 0.44–1.00)
Glucose, Bld: 132 mg/dL — ABNORMAL HIGH (ref 65–99)
POTASSIUM: 4 mmol/L (ref 3.5–5.1)
Sodium: 136 mmol/L (ref 135–145)
Total Bilirubin: 0.4 mg/dL (ref 0.3–1.2)
Total Protein: 8.1 g/dL (ref 6.5–8.1)

## 2017-06-09 LAB — SURGICAL PCR SCREEN
MRSA, PCR: NEGATIVE
Staphylococcus aureus: NEGATIVE

## 2017-06-09 LAB — PROTIME-INR
INR: 1.04
PROTHROMBIN TIME: 13.5 s (ref 11.4–15.2)

## 2017-06-09 LAB — APTT: aPTT: 32 seconds (ref 24–36)

## 2017-06-09 LAB — HCG, SERUM, QUALITATIVE: PREG SERUM: NEGATIVE

## 2017-06-09 LAB — GLUCOSE, CAPILLARY
Glucose-Capillary: 129 mg/dL — ABNORMAL HIGH (ref 65–99)
Glucose-Capillary: 128 mg/dL — ABNORMAL HIGH (ref 65–99)

## 2017-06-09 LAB — HEMOGLOBIN A1C
HEMOGLOBIN A1C: 6.5 % — AB (ref 4.8–5.6)
Mean Plasma Glucose: 139.85 mg/dL

## 2017-06-09 SURGERY — REVISION, AMPUTATION SITE
Anesthesia: Choice | Laterality: Left

## 2017-06-09 MED ORDER — METOCLOPRAMIDE HCL 5 MG/ML IJ SOLN: 10.0000 mg | Freq: Once | INTRAMUSCULAR | Status: DC | PRN

## 2017-06-09 MED ORDER — MIDAZOLAM HCL 5 MG/5ML IJ SOLN
INTRAMUSCULAR | Status: DC | PRN
  Administered 2017-06-09: 2 mg via INTRAVENOUS

## 2017-06-09 MED ORDER — CEFAZOLIN SODIUM-DEXTROSE 2-4 GM/100ML-% IV SOLN
2.0000 g | INTRAVENOUS | Status: AC
  Administered 2017-06-09: 2 g via INTRAVENOUS

## 2017-06-09 MED ORDER — CEFAZOLIN SODIUM-DEXTROSE 2-4 GM/100ML-% IV SOLN
INTRAVENOUS | Status: AC
  Filled 2017-06-09: qty 100

## 2017-06-09 MED ORDER — LACTATED RINGERS IV SOLN: INTRAVENOUS | Status: DC

## 2017-06-09 MED ORDER — OXYCODONE-ACETAMINOPHEN 5-325 MG PO TABS
ORAL_TABLET | ORAL | Status: AC
  Filled 2017-06-09: qty 1

## 2017-06-09 MED ORDER — 0.9 % SODIUM CHLORIDE (POUR BTL) OPTIME
TOPICAL | Status: DC | PRN
  Administered 2017-06-09: 1000 mL

## 2017-06-09 MED ORDER — LIDOCAINE HCL (CARDIAC) 20 MG/ML IV SOLN
INTRAVENOUS | Status: DC | PRN
  Administered 2017-06-09: 100 mg via INTRAVENOUS

## 2017-06-09 MED ORDER — OXYCODONE-ACETAMINOPHEN 5-325 MG PO TABS
1.0000 | ORAL_TABLET | Freq: Once | ORAL | Status: AC
  Administered 2017-06-09: 1 via ORAL

## 2017-06-09 MED ORDER — PROPOFOL 10 MG/ML IV BOLUS
INTRAVENOUS | Status: AC
  Filled 2017-06-09: qty 20

## 2017-06-09 MED ORDER — FENTANYL CITRATE (PF) 100 MCG/2ML IJ SOLN
INTRAMUSCULAR | Status: AC
  Filled 2017-06-09: qty 2

## 2017-06-09 MED ORDER — CHLORHEXIDINE GLUCONATE 4 % EX LIQD: 60.0000 mL | Freq: Once | CUTANEOUS | Status: DC

## 2017-06-09 MED ORDER — MIDAZOLAM HCL 2 MG/2ML IJ SOLN
INTRAMUSCULAR | Status: AC
  Filled 2017-06-09: qty 2

## 2017-06-09 MED ORDER — ONDANSETRON HCL 4 MG/2ML IJ SOLN
INTRAMUSCULAR | Status: DC | PRN
  Administered 2017-06-09: 4 mg via INTRAVENOUS

## 2017-06-09 MED ORDER — PHENYLEPHRINE HCL 10 MG/ML IJ SOLN
INTRAMUSCULAR | Status: DC | PRN
  Administered 2017-06-09 (×3): 80 ug via INTRAVENOUS

## 2017-06-09 MED ORDER — MEPERIDINE HCL 25 MG/ML IJ SOLN: 6.2500 mg | INTRAMUSCULAR | Status: DC | PRN

## 2017-06-09 MED ORDER — SUCCINYLCHOLINE CHLORIDE 20 MG/ML IJ SOLN
INTRAMUSCULAR | Status: DC | PRN
  Administered 2017-06-09: 100 mg via INTRAVENOUS

## 2017-06-09 MED ORDER — FENTANYL CITRATE (PF) 100 MCG/2ML IJ SOLN
25.0000 ug | INTRAMUSCULAR | Status: DC | PRN
  Administered 2017-06-09 (×2): 50 ug via INTRAVENOUS

## 2017-06-09 MED ORDER — FENTANYL CITRATE (PF) 100 MCG/2ML IJ SOLN
INTRAMUSCULAR | Status: DC | PRN
  Administered 2017-06-09: 100 ug via INTRAVENOUS
  Administered 2017-06-09 (×3): 50 ug via INTRAVENOUS

## 2017-06-09 MED ORDER — LACTATED RINGERS IV SOLN
INTRAVENOUS | Status: DC | PRN
  Administered 2017-06-09: 09:00:00 via INTRAVENOUS

## 2017-06-09 MED ORDER — FENTANYL CITRATE (PF) 250 MCG/5ML IJ SOLN
INTRAMUSCULAR | Status: AC
  Filled 2017-06-09: qty 5

## 2017-06-09 MED ORDER — PROPOFOL 10 MG/ML IV BOLUS
INTRAVENOUS | Status: DC | PRN
  Administered 2017-06-09: 200 mg via INTRAVENOUS

## 2017-06-09 SURGICAL SUPPLY — 34 items
APL SKNCLS STERI-STRIP NONHPOA (GAUZE/BANDAGES/DRESSINGS) ×4
BENZOIN TINCTURE PRP APPL 2/3 (GAUZE/BANDAGES/DRESSINGS) ×8 IMPLANT
BLADE SAW RECIP 87.9 MT (BLADE) ×2 IMPLANT
BLADE SURG 21 STRL SS (BLADE) ×3 IMPLANT
CANISTER SUCTION 1500CC (MISCELLANEOUS) ×2 IMPLANT
COVER SURGICAL LIGHT HANDLE (MISCELLANEOUS) ×3 IMPLANT
DRAPE EXTREMITY T 121X128X90 (DRAPE) ×3 IMPLANT
DRAPE HALF SHEET 40X57 (DRAPES) ×3 IMPLANT
DRAPE INCISE IOBAN 66X45 STRL (DRAPES) ×5 IMPLANT
DRAPE U-SHAPE 47X51 STRL (DRAPES) ×6 IMPLANT
DRESSING PREVENA PLUS CUSTOM (GAUZE/BANDAGES/DRESSINGS) IMPLANT
DRSG PREVENA PLUS CUSTOM (GAUZE/BANDAGES/DRESSINGS) ×3
DRSG VAC ATS MED SENSATRAC (GAUZE/BANDAGES/DRESSINGS) ×3 IMPLANT
DURAPREP 26ML APPLICATOR (WOUND CARE) ×3 IMPLANT
ELECT REM PT RETURN 9FT ADLT (ELECTROSURGICAL) ×3
ELECTRODE REM PT RTRN 9FT ADLT (ELECTROSURGICAL) ×1 IMPLANT
GLOVE BIOGEL PI IND STRL 9 (GLOVE) ×1 IMPLANT
GLOVE BIOGEL PI INDICATOR 9 (GLOVE) ×2
GLOVE SURG ORTHO 9.0 STRL STRW (GLOVE) ×5 IMPLANT
GOWN STRL REUS W/ TWL XL LVL3 (GOWN DISPOSABLE) ×2 IMPLANT
GOWN STRL REUS W/TWL XL LVL3 (GOWN DISPOSABLE) ×9
KIT BASIN OR (CUSTOM PROCEDURE TRAY) ×3 IMPLANT
KIT DRSG PREVENA PLUS 7DAY 125 (MISCELLANEOUS) ×2 IMPLANT
KIT ROOM TURNOVER OR (KITS) ×3 IMPLANT
MANIFOLD NEPTUNE II (INSTRUMENTS) ×3 IMPLANT
NS IRRIG 1000ML POUR BTL (IV SOLUTION) ×3 IMPLANT
PACK GENERAL/GYN (CUSTOM PROCEDURE TRAY) ×3 IMPLANT
PAD ARMBOARD 7.5X6 YLW CONV (MISCELLANEOUS) ×3 IMPLANT
PAD NEG PRESSURE SENSATRAC (MISCELLANEOUS) IMPLANT
STAPLER VISISTAT 35W (STAPLE) IMPLANT
SUT ETHILON 2 0 PSLX (SUTURE) ×10 IMPLANT
SUT SILK 2 0 (SUTURE)
SUT SILK 2-0 18XBRD TIE 12 (SUTURE) IMPLANT
TOWEL OR 17X26 10 PK STRL BLUE (TOWEL DISPOSABLE) ×3 IMPLANT

## 2017-06-09 NOTE — Anesthesia Preprocedure Evaluation (Signed)
Anesthesia Evaluation  Patient identified by MRN, date of birth, ID band Patient awake    Reviewed: Allergy & Precautions, NPO status , Patient's Chart, lab work & pertinent test results  Airway Mallampati: II  TM Distance: >3 FB Neck ROM: Full    Dental  (+) Teeth Intact, Dental Advisory Given   Pulmonary sleep apnea and Continuous Positive Airway Pressure Ventilation , Current Smoker,    Pulmonary exam normal breath sounds clear to auscultation       Cardiovascular hypertension, Pt. on medications Normal cardiovascular exam Rhythm:Regular Rate:Normal  ECHO 01/25/17 >The cavity size was normal. Wall thickness wasincreased in a pattern of mild LVH. Systolic function was normal.The estimated ejection fraction was in the range of 60% to 65%.Although no diagnostic regional wall motion abnormality wasidentified, this possibility cannot be completely excluded on the basis of this study. Features are consistent with a pseudonormalleft ventricular filling pattern, with concomitant abnormalrelaxation and increased filling pressure (grade 2 diastolicdysfunction).      Neuro/Psych PSYCHIATRIC DISORDERS Anxiety Depression Bipolar Disorder  Neuromuscular disease    GI/Hepatic Neg liver ROS, GERD  ,  Endo/Other  diabetes, Type 2, Insulin Dependent, Oral Hypoglycemic AgentsHypothyroidism Morbid obesity  Renal/GU Renal InsufficiencyRenal disease     Musculoskeletal  (+) Fibromyalgia -  Abdominal   Peds  Hematology  (+) Blood dyscrasia, anemia ,   Anesthesia Other Findings   Reproductive/Obstetrics                             Anesthesia Physical  Anesthesia Plan  ASA: III  Anesthesia Plan:    Post-op Pain Management:    Induction: Intravenous  PONV Risk Score and Plan: 2 and Ondansetron and Midazolam  Airway Management Planned: Oral ETT  Additional Equipment:   Intra-op Plan:    Post-operative Plan: Extubation in OR  Informed Consent: I have reviewed the patients History and Physical, chart, labs and discussed the procedure including the risks, benefits and alternatives for the proposed anesthesia with the patient or authorized representative who has indicated his/her understanding and acceptance.   Dental advisory given  Plan Discussed with: CRNA  Anesthesia Plan Comments: (Risks/benefits of general anesthesia discussed with patient including risk of damage to teeth, lips, gum, and tongue, nausea/vomiting, allergic reactions to medications, and the possibility of heart attack, stroke and death.  All patient questions answered.  Patient wishes to proceed.)        Anesthesia Quick Evaluation

## 2017-06-09 NOTE — Anesthesia Postprocedure Evaluation (Signed)
Anesthesia Post Note  Patient: [name] [name]  Procedure(s) Performed: REVISION LEFT BELOW KNEE AMPUTATION (Left )     Patient location during evaluation: PACU Anesthesia Type: General Level of consciousness: awake and alert Pain management: pain level controlled Vital Signs Assessment: post-procedure vital signs reviewed and stable Respiratory status: spontaneous breathing, nonlabored ventilation, respiratory function stable and patient connected to nasal cannula oxygen Cardiovascular status: blood pressure returned to baseline and stable Postop Assessment: no apparent nausea or vomiting Anesthetic complications: no    Last Vitals:  Vitals:   06/09/17 1115 06/09/17 1133  BP:  (!) 143/90  Pulse: 90 95  Resp: 17   Temp: 36.5 C   SpO2: 100% 100%    Last Pain:  Vitals:   06/09/17 1047  PainSc: Asleep                 Montez Hageman

## 2017-06-09 NOTE — Anesthesia Procedure Notes (Signed)
Procedure Name: Intubation Date/Time: 06/09/2017 9:08 AM Performed by: Purvis Kilts, CRNA Pre-anesthesia Checklist: Patient identified, Emergency Drugs available, Suction available, Patient being monitored and Timeout performed Patient Re-evaluated:Patient Re-evaluated prior to induction Oxygen Delivery Method: Circle system utilized Preoxygenation: Pre-oxygenation with 100% oxygen Induction Type: IV induction Ventilation: Mask ventilation without difficulty Laryngoscope Size: Mac and 4 Grade View: Grade I Tube type: Oral Tube size: 7.0 mm Number of attempts: 1 Airway Equipment and Method: Stylet Placement Confirmation: ETT inserted through vocal cords under direct vision,  positive ETCO2 and breath sounds checked- equal and bilateral Secured at: 21 cm Tube secured with: Tape Dental Injury: Teeth and Oropharynx as per pre-operative assessment

## 2017-06-09 NOTE — Transfer of Care (Signed)
Immediate Anesthesia Transfer of Care Note  Patient: Stephannie C Gravois  Procedure(s) Performed: REVISION LEFT BELOW KNEE AMPUTATION (Left )  Patient Location: PACU  Anesthesia Type:General  Level of Consciousness: awake  Airway & Oxygen Therapy: Patient Spontanous Breathing  Post-op Assessment: Report given to RN and Post -op Vital signs reviewed and stable  Post vital signs: Reviewed and stable  Last Vitals: There were no vitals filed for this visit.  Last Pain:  Vitals:   06/09/17 0653  PainSc: 9       Patients Stated Pain Goal: 5 (16/10/96 0454)  Complications: No apparent anesthesia complications

## 2017-06-09 NOTE — Op Note (Signed)
06/09/2017  9:49 AM  PATIENT:  [name] [name]    PRE-OPERATIVE DIAGNOSIS:  Dehiscence Left Below Knee Amputation  POST-OPERATIVE DIAGNOSIS:  Same  PROCEDURE:  REVISION LEFT BELOW KNEE AMPUTATION Application of Praveena incisional wound VAC.  SURGEON:  Newt Minion, MD  PHYSICIAN ASSISTANT:None ANESTHESIA:   General  PREOPERATIVE INDICATIONS:  [name] [name] is a  42 y.o. female with a diagnosis of Dehiscence Left Below Knee Amputation who failed conservative measures and elected for surgical management.    The risks benefits and alternatives were discussed with the patient preoperatively including but not limited to the risks of infection, bleeding, nerve injury, cardiopulmonary complications, the need for revision surgery, among others, and the patient was willing to proceed.  OPERATIVE IMPLANTS: Praveena wound VAC.  OPERATIVE FINDINGS: Deep abscess cultures obtained.  OPERATIVE PROCEDURE: Patient was brought the operating room and underwent a general anesthetic.  After adequate levels of anesthesia were obtained patient's left lower extremity was prepped using DuraPrep draped into a sterile field a timeout was called.  Elliptical incision was made around the ulcerative tissue.  There was a deep abscess.  The resection margins were carried down outside the ulcerative tissue so the ulcerative tissue with infection was resected in one block of tissue without this communicating to the resection margins.  The distal centimeter of the tibia and fibula were resected back to healthy bleeding bone there was no signs of infection.  Electrocautery was used for hemostasis.  The wound was irrigated with normal saline.  The wound edges were cleaned healthy with good petechial bleeding.  The wound was closed using 2-0 nylon.  A Praveena wound VAC was applied this had a good suction fit patient was extubated taken the PACU in stable condition.   DISCHARGE PLANNING:  Antibiotic duration:  Will restart oral antibiotics once cultures are finalized.  Weightbearing: Nonweightbearing on the left.  Pain medication: Patient has a prescription for Percocet for home.  Dressing care/ Wound VAC: Continue wound VAC for 1 week.  Ambulatory devices: Walker.  Discharge to: Home.  Follow-up: In the office 1 week post operative.

## 2017-06-09 NOTE — H&P (Signed)
[name] [name] is an 42 y.o. female.   Chief Complaint: Dehiscence ulceration left transtibial amputation. HPI: Patient is a 23-year-old woman with diabetic insensate neuropathy who is status post a left transtibial amputation.  Patient has had progressive dehiscence medially she has undergone conservative wound care without resolution and presents at this time for revision transtibial amputation.  Past Medical History:  Diagnosis Date  . Abnormal Pap smear of cervix 2009  . Abscess    history of multiple abscesses  . Anemia of chronic disease 2002  . Anxiety    Panic attacks  . Bipolar disorder ([place])   . Cellulitis 05/21/2014   right eye  . Chronic bronchitis ([place])    "get it q yr" (05/13/2013)  . Chronic pain   . Depression   . Edema of lower extremity   . Endocarditis 2002   subacute bacterial endocarditis.   . Family history of anesthesia complication    "my mom has a hard time coming out from under"  . Fibromyalgia   . GERD (gastroesophageal reflux disease)    occ  . Heart murmur   . History of blood transfusion    "just low blood count" (05/13/2013)  . Hyperlipidemia   . Hypertension   . Hypothyroidism   . Hypothyroidism, adult 03/21/2014  . Necrosis ([place])    and ulceration  . Obesity   . OSA on CPAP    does not wear CPAP  . Peripheral neuropathy   . Type II diabetes mellitus ([place])    Type  II    Past Surgical History:  Procedure Laterality Date  . AMPUTATION Left 04/07/2017   Procedure: LEFT BELOW KNEE AMPUTATION;  Surgeon: Newt Minion, MD;  Location: [place];  Service: Orthopedics;  Laterality: Left;  . EYE SURGERY     lazer  . INCISION AND DRAINAGE ABSCESS     multiple I&Ds  . INCISION AND DRAINAGE ABSCESS Left 07/09/2012   Procedure: DRESSING CHANGE, THIGH WOUND;  Surgeon: Harl Bowie, MD;  Location: [place];  Service: General;  Laterality: Left;  . INCISION AND DRAINAGE OF WOUND Left 07/07/2012   Procedure: IRRIGATION AND DEBRIDEMENT WOUND;   Surgeon: Harl Bowie, MD;  Location: [place];  Service: General;  Laterality: Left;  . INCISION AND DRAINAGE PERIRECTAL ABSCESS Left 07/14/2012   Procedure: DEBRIDEMENT OF SKIN & SOFT TISSUE; DRESSING CHANGE UNDER ANESTHESIA;  Surgeon: Gayland Curry, MD,FACS;  Location: [place];  Service: General;  Laterality: Left;  . INCISION AND DRAINAGE PERIRECTAL ABSCESS Left 07/16/2012   Procedure: I&D Left Thigh;  Surgeon: Gwenyth Ober, MD;  Location: [place];  Service: General;  Laterality: Left;  . INCISION AND DRAINAGE PERIRECTAL ABSCESS N/A 01/05/2015   Procedure: IRRIGATION AND DEBRIDEMENT PERIRECTAL ABSCESS;  Surgeon: Donnie Mesa, MD;  Location: [place];  Service: General;  Laterality: N/A;  . IRRIGATION AND DEBRIDEMENT ABSCESS Left 07/06/2012   Procedure: IRRIGATION AND DEBRIDEMENT ABSCESS BUTTOCKS AND THIGH;  Surgeon: Shann Medal, MD;  Location: [place] [facility];  Service: General;  Laterality: Left;  . IRRIGATION AND DEBRIDEMENT ABSCESS Left 08/10/2012   Procedure: IRRIGATION AND DEBRIDEMENT ABSCESS;  Surgeon: Madilyn Hook, DO;  Location: [place];  Service: General;  Laterality: Left;  . TONSILLECTOMY  1994    Family History  Problem Relation Age of Onset  . Heart failure Mother   . Diabetes Mother   . Kidney disease Mother   . Kidney disease Father   . Diabetes Father   . Diabetes  Paternal Grandmother   . Heart failure Paternal Grandmother   . Other Other    Social History:  reports that she has been smoking cigarettes.  She has a 7.50 pack-year smoking history. she has never used smokeless tobacco. She reports that she drinks alcohol. She reports that she does not use drugs.  Allergies:  Allergies  Allergen Reactions  . Vancomycin Other (See Comments)    Acute renal failure suspected secondary to vanco    Medications Prior to Admission  Medication Sig Dispense Refill  . amitriptyline (ELAVIL) 50 MG tablet Take 1 tablet (50 mg total) by mouth at bedtime. 90 tablet 3  . Ascorbic Acid (VITAMIN  C) 1000 MG tablet Take 1,000 mg by mouth daily.    Marland Kitchen atorvastatin (LIPITOR) 20 MG tablet Take 1 tablet (20 mg total) by mouth daily. 30 tablet 11  . bumetanide (BUMEX) 1 MG tablet Take 1 tablet (1 mg total) by mouth 2 (two) times daily. 60 tablet 0  . cyclobenzaprine (FLEXERIL) 10 MG tablet Take 10 mg by mouth every 12 (twelve) hours as needed for muscle spasms.    Marland Kitchen docusate sodium (COLACE) 100 MG capsule Take 1 capsule (100 mg total) by mouth 2 (two) times daily. (Patient taking differently: Take 100 mg by mouth 2 (two) times daily as needed for moderate constipation. ) 10 capsule 0  . insulin aspart (NOVOLOG FLEXPEN) 100 UNIT/ML FlexPen Inject 6 Units into the skin 3 (three) times daily with meals. (Patient taking differently: Inject 14 Units into the skin 3 (three) times daily with meals. ) 15 mL 1  . insulin glargine (LANTUS) 100 unit/mL SOPN Inject 0.35 mLs (35 Units total) into the skin at bedtime. (Patient taking differently: Inject 40 Units into the skin 2 (two) times daily. ) 15 mL 11  . iron polysaccharides (NIFEREX) 150 MG capsule Take 1 capsule (150 mg total) by mouth 2 (two) times daily before lunch and supper. (Patient taking differently: Take 150 mg by mouth daily. ) 60 capsule 0  . liraglutide (VICTOZA) 18 MG/3ML SOPN Inject 1.8 mg into the skin daily.    . methocarbamol (ROBAXIN) 500 MG tablet Take 1 tablet (500 mg total) by mouth every 6 (six) hours as needed for muscle spasms. 90 tablet 0  . Multiple Vitamins-Calcium (ONE-A-DAY WOMENS PO) Take 1 tablet by mouth daily.    Marland Kitchen oxyCODONE-acetaminophen (ROXICET) 5-325 MG tablet Take 1 tablet by mouth every 6 (six) hours as needed for severe pain. 30 tablet 0  . polyethylene glycol (MIRALAX / GLYCOLAX) packet Take 17 g by mouth daily. (Patient taking differently: Take 17 g by mouth daily as needed for moderate constipation. ) 14 each 0  . pregabalin (LYRICA) 100 MG capsule Take 1 capsule (100 mg total) by mouth 3 (three) times daily. 90  capsule 0  . silver sulfADIAZINE (SILVADENE) 1 % cream Apply 1 application topically daily. (Patient taking differently: Apply 1 application topically 2 (two) times daily. ) 50 g 0  . VOLTAREN 1 % GEL Apply 4 grams to affected area (topically) 4 times daily (Patient taking differently: Apply 4 grams to affected area (topically) 4 times daily as needed for pain) 100 g 2  . ACCU-CHEK FASTCLIX LANCETS MISC Check blood sugar 3 times a day before meals 102 each 12  . aspirin EC 325 MG EC tablet Take 1 tablet (325 mg total) by mouth daily. 30 tablet 0  . Blood Glucose Monitoring Suppl (ACCU-CHEK AVIVA PLUS) w/Device KIT 1 each by Does  not apply route 4 (four) times daily. 1 kit 1  . glucose blood (ACCU-CHEK AVIVA PLUS) test strip Check blood sugar 4 times a day before meals 150 each 3  . oxyCODONE-acetaminophen (PERCOCET/ROXICET) 5-325 MG tablet Take 1 tablet by mouth every 6 (six) hours as needed for severe pain. 40 tablet 0  . prednisoLONE acetate (PRED FORTE) 1 % ophthalmic suspension Place 1 drop into the right eye 4 (four) times daily. 10 mL 0    No results found for this or any previous visit (from the past 48 hour(s)). No results found.  Review of Systems  All other systems reviewed and are negative.   There were no vitals taken for this visit. Physical Exam  Examination patient is alert oriented no adenopathy well-dressed normal affect normal respiratory effort she ambulates in a wheelchair.  She has a wound which is 2 cm in diameter 4 cm deep of the medial aspect of the transtibial amputation she does have swelling there is purulent drainage. Assessment/Plan Assessment: Diabetic insensate neuropathy with dehiscence left transtibial amputation.  Plan: We will plan for revision amputation as outpatient surgery.  Risks and benefits were discussed including risk of the wound not healing need for higher level amputation.  Patient states she understands wished to proceed at this time.  Newt Minion, MD 06/09/2017, 6:26 AM

## 2017-06-10 ENCOUNTER — Encounter (HOSPITAL_COMMUNITY): Payer: Self-pay | Admitting: Orthopedic Surgery

## 2017-06-12 ENCOUNTER — Encounter (INDEPENDENT_AMBULATORY_CARE_PROVIDER_SITE_OTHER): Payer: Self-pay | Admitting: Family

## 2017-06-12 ENCOUNTER — Ambulatory Visit (INDEPENDENT_AMBULATORY_CARE_PROVIDER_SITE_OTHER): Payer: Medicare Other | Admitting: Family

## 2017-06-12 DIAGNOSIS — Z89512 Acquired absence of left leg below knee: Secondary | ICD-10-CM

## 2017-06-12 NOTE — Progress Notes (Signed)
Post-Op Visit Note   Patient: [name] [name]           Date of Birth: Feb 14, 1976           MRN: [id number] Visit Date: 06/12/2017 PCP: Colbert Ewing, MD  Chief Complaint:  Chief Complaint  Patient presents with  . Left Leg - Follow-up    06/09/17 L BKA Revision    HPI:  HPI The patient is a 42 year old woman seen for evaluation of left below knee amputation revision. Wound vac filled. She removed and applied a dry dressing.  Ortho Exam Incision well approximated with sutures. No drainage, erythema. No sign of infection.  Visit Diagnoses:  1. Status post below knee amputation, left (HCC)     Plan: begin daily dry dressing changes following wound cleansing. Follow up in office in 2 weeks.   Follow-Up Instructions: No Follow-up on file.   Imaging: No results found.  Orders:  No orders of the defined types were placed in this encounter.  No orders of the defined types were placed in this encounter.    PMFS History: Patient Active Problem List   Diagnosis Date Noted  . Dehiscence of amputation stump ([place])   . Fall   . Stage 3 chronic kidney disease ([place])   . Peripheral edema   . Poorly controlled type 2 diabetes mellitus with peripheral neuropathy ([place])   . Status post below knee amputation, left ([place]) 04/11/2017  . Post-operative pain   . Bipolar affective disorder ([place])   . Type 2 diabetes mellitus with peripheral neuropathy (H[facility])   . OSA (obstructive sleep apnea)   . Super obese   . Leukocytosis   . Acute blood loss anemia   . Adjustment disorder with depressed mood 03/26/2017  . Severe sepsis ([place]) 03/24/2017  . Abnormal ECG 03/24/2017  . Acute bilateral low back pain 02/13/2017  . Heart failure with preserved ejection fraction ([place]) 02/09/2017  . Jerking movements of extremities 12/08/2016  . Routine adult health maintenance 12/08/2016  . Chronic pain of right knee 09/08/2016  . Abdominal muscle pain 09/08/2016  . Vertigo 08/05/2016  .  Lightheadedness 07/15/2016  . CKD (chronic kidney disease) stage 2, GFR 60-89 ml/min 07/15/2016  . Bilateral lower extremity edema 05/13/2016  . OSA on CPAP 05/13/2016  . Vaginal pruritus 03/14/2016  . Morbid obesity due to excess calories ([place]) 11/27/2015  . Callus of foot 11/26/2015  . History of Low serum cortisol level ([place]) 01/07/2015  . Financial difficulty 04/25/2013  . Uncontrolled type 2 diabetes mellitus ([place]) 07/12/2012  . Necrotizing fasciitis s/p OR debridements 07/06/2012  . Carpal tunnel syndrome, bilateral 01/25/2012  . Diabetic peripheral neuropathy ([place]) 07/04/2011  . Anxiety and depression 06/02/2011  . History of abnormal cervical Pap smear 12/13/2007  . GERD 12/06/2007  . Hyperlipidemia 08/29/2007  . Hypertension associated with diabetes ([place]) 08/29/2007   Past Medical History:  Diagnosis Date  . Abnormal Pap smear of cervix 2009  . Abscess    history of multiple abscesses  . Anemia of chronic disease 2002  . Anxiety    Panic attacks  . Bipolar disorder ([place])   . Cellulitis 05/21/2014   right eye  . Chronic bronchitis ([place])    "get it q yr" (05/13/2013)  . Chronic pain   . Depression   . Edema of lower extremity   . Endocarditis 2002   subacute bacterial endocarditis.   . Family history of anesthesia complication    "my mom has a  hard time coming out from under"  . Fibromyalgia   . GERD (gastroesophageal reflux disease)    occ  . Heart murmur   . History of blood transfusion    "just low blood count" (05/13/2013)  . Hyperlipidemia   . Hypertension   . Hypothyroidism   . Hypothyroidism, adult 03/21/2014  . Necrosis ([place])    and ulceration  . Obesity   . OSA on CPAP    does not wear CPAP  . Peripheral neuropathy   . Type II diabetes mellitus (H[facility])    Type  II    Family History  Problem Relation Age of Onset  . Heart failure Mother   . Diabetes Mother   . Kidney disease Mother   . Kidney disease Father   . Diabetes Father   . Diabetes  Paternal Grandmother   . Heart failure Paternal Grandmother   . Other Other     Past Surgical History:  Procedure Laterality Date  . AMPUTATION Left 04/07/2017   Procedure: LEFT BELOW KNEE AMPUTATION;  Surgeon: Newt Minion, MD;  Location: [facility] [facility];  Service: Orthopedics;  Laterality: Left;  . EYE SURGERY     lazer  . INCISION AND DRAINAGE ABSCESS     multiple I&Ds  . INCISION AND DRAINAGE ABSCESS Left 07/09/2012   Procedure: DRESSING CHANGE, THIGH WOUND;  Surgeon: Harl Bowie, MD;  Location: [facility];  Service: General;  Laterality: Left;  . INCISION AND DRAINAGE OF WOUND Left 07/07/2012   Procedure: IRRIGATION AND DEBRIDEMENT WOUND;  Surgeon: Harl Bowie, MD;  Location: [place];  Service: General;  Laterality: Left;  . INCISION AND DRAINAGE PERIRECTAL ABSCESS Left 07/14/2012   Procedure: DEBRIDEMENT OF SKIN & SOFT TISSUE; DRESSING CHANGE UNDER ANESTHESIA;  Surgeon: Gayland Curry, MD,FACS;  Location: [facility];  Service: General;  Laterality: Left;  . INCISION AND DRAINAGE PERIRECTAL ABSCESS Left 07/16/2012   Procedure: I&D Left Thigh;  Surgeon: Gwenyth Ober, MD;  Location: [facility];  Service: General;  Laterality: Left;  . INCISION AND DRAINAGE PERIRECTAL ABSCESS N/A 01/05/2015   Procedure: IRRIGATION AND DEBRIDEMENT PERIRECTAL ABSCESS;  Surgeon: Donnie Mesa, MD;  Location: [facility];  Service: General;  Laterality: N/A;  . IRRIGATION AND DEBRIDEMENT ABSCESS Left 07/06/2012   Procedure: IRRIGATION AND DEBRIDEMENT ABSCESS BUTTOCKS AND THIGH;  Surgeon: Shann Medal, MD;  Location: [facility];  Service: General;  Laterality: Left;  . IRRIGATION AND DEBRIDEMENT ABSCESS Left 08/10/2012   Procedure: IRRIGATION AND DEBRIDEMENT ABSCESS;  Surgeon: Madilyn Hook, DO;  Location: [facility];  Service: General;  Laterality: Left;  . STUMP REVISION Left 06/09/2017   Procedure: REVISION LEFT BELOW KNEE AMPUTATION;  Surgeon: Newt Minion, MD;  Location: [facility];  Service: Orthopedics;  Laterality: Left;  .  TONSILLECTOMY  1994   Social History   Occupational History  . Occupation: disability  Tobacco Use  . Smoking status: Current Every Day Smoker    Packs/day: 0.50    Years: 15.00    Pack years: 7.50    Types: Cigarettes  . Smokeless tobacco: Never Used  . Tobacco comment: Cutting back.   Substance and Sexual Activity  . Alcohol use: Yes    Alcohol/week: 0.0 oz    Comment: Socially-" monthy  maybe"  . Drug use: No  . Sexual activity: Yes

## 2017-06-14 DIAGNOSIS — Z4781 Encounter for orthopedic aftercare following surgical amputation: Secondary | ICD-10-CM | POA: Diagnosis not present

## 2017-06-14 DIAGNOSIS — N183 Chronic kidney disease, stage 3 (moderate): Secondary | ICD-10-CM | POA: Diagnosis not present

## 2017-06-14 DIAGNOSIS — D631 Anemia in chronic kidney disease: Secondary | ICD-10-CM | POA: Diagnosis not present

## 2017-06-14 DIAGNOSIS — I130 Hypertensive heart and chronic kidney disease with heart failure and stage 1 through stage 4 chronic kidney disease, or unspecified chronic kidney disease: Secondary | ICD-10-CM | POA: Diagnosis not present

## 2017-06-14 DIAGNOSIS — E1122 Type 2 diabetes mellitus with diabetic chronic kidney disease: Secondary | ICD-10-CM | POA: Diagnosis not present

## 2017-06-14 DIAGNOSIS — I509 Heart failure, unspecified: Secondary | ICD-10-CM | POA: Diagnosis not present

## 2017-06-14 LAB — AEROBIC/ANAEROBIC CULTURE W GRAM STAIN (SURGICAL/DEEP WOUND)

## 2017-06-14 LAB — AEROBIC/ANAEROBIC CULTURE (SURGICAL/DEEP WOUND)

## 2017-06-15 ENCOUNTER — Other Ambulatory Visit (INDEPENDENT_AMBULATORY_CARE_PROVIDER_SITE_OTHER): Payer: Self-pay

## 2017-06-15 DIAGNOSIS — D631 Anemia in chronic kidney disease: Secondary | ICD-10-CM | POA: Diagnosis not present

## 2017-06-15 DIAGNOSIS — E1122 Type 2 diabetes mellitus with diabetic chronic kidney disease: Secondary | ICD-10-CM | POA: Diagnosis not present

## 2017-06-15 DIAGNOSIS — N183 Chronic kidney disease, stage 3 (moderate): Secondary | ICD-10-CM | POA: Diagnosis not present

## 2017-06-15 DIAGNOSIS — I509 Heart failure, unspecified: Secondary | ICD-10-CM | POA: Diagnosis not present

## 2017-06-15 DIAGNOSIS — Z4781 Encounter for orthopedic aftercare following surgical amputation: Secondary | ICD-10-CM | POA: Diagnosis not present

## 2017-06-15 DIAGNOSIS — I130 Hypertensive heart and chronic kidney disease with heart failure and stage 1 through stage 4 chronic kidney disease, or unspecified chronic kidney disease: Secondary | ICD-10-CM | POA: Diagnosis not present

## 2017-06-15 MED ORDER — CEPHALEXIN 500 MG PO CAPS: 500.0000 mg | ORAL_CAPSULE | Freq: Three times a day (TID) | ORAL | 0 refills | Status: DC

## 2017-06-16 DIAGNOSIS — I130 Hypertensive heart and chronic kidney disease with heart failure and stage 1 through stage 4 chronic kidney disease, or unspecified chronic kidney disease: Secondary | ICD-10-CM | POA: Diagnosis not present

## 2017-06-16 DIAGNOSIS — E1122 Type 2 diabetes mellitus with diabetic chronic kidney disease: Secondary | ICD-10-CM | POA: Diagnosis not present

## 2017-06-16 DIAGNOSIS — I509 Heart failure, unspecified: Secondary | ICD-10-CM | POA: Diagnosis not present

## 2017-06-16 DIAGNOSIS — D631 Anemia in chronic kidney disease: Secondary | ICD-10-CM | POA: Diagnosis not present

## 2017-06-16 DIAGNOSIS — N183 Chronic kidney disease, stage 3 (moderate): Secondary | ICD-10-CM | POA: Diagnosis not present

## 2017-06-16 DIAGNOSIS — Z4781 Encounter for orthopedic aftercare following surgical amputation: Secondary | ICD-10-CM | POA: Diagnosis not present

## 2017-06-19 DIAGNOSIS — D631 Anemia in chronic kidney disease: Secondary | ICD-10-CM | POA: Diagnosis not present

## 2017-06-19 DIAGNOSIS — N183 Chronic kidney disease, stage 3 (moderate): Secondary | ICD-10-CM | POA: Diagnosis not present

## 2017-06-19 DIAGNOSIS — I130 Hypertensive heart and chronic kidney disease with heart failure and stage 1 through stage 4 chronic kidney disease, or unspecified chronic kidney disease: Secondary | ICD-10-CM | POA: Diagnosis not present

## 2017-06-19 DIAGNOSIS — Z4781 Encounter for orthopedic aftercare following surgical amputation: Secondary | ICD-10-CM | POA: Diagnosis not present

## 2017-06-19 DIAGNOSIS — I509 Heart failure, unspecified: Secondary | ICD-10-CM | POA: Diagnosis not present

## 2017-06-19 DIAGNOSIS — E1122 Type 2 diabetes mellitus with diabetic chronic kidney disease: Secondary | ICD-10-CM | POA: Diagnosis not present

## 2017-06-22 DIAGNOSIS — E1122 Type 2 diabetes mellitus with diabetic chronic kidney disease: Secondary | ICD-10-CM | POA: Diagnosis not present

## 2017-06-22 DIAGNOSIS — I130 Hypertensive heart and chronic kidney disease with heart failure and stage 1 through stage 4 chronic kidney disease, or unspecified chronic kidney disease: Secondary | ICD-10-CM | POA: Diagnosis not present

## 2017-06-22 DIAGNOSIS — N183 Chronic kidney disease, stage 3 (moderate): Secondary | ICD-10-CM | POA: Diagnosis not present

## 2017-06-22 DIAGNOSIS — D631 Anemia in chronic kidney disease: Secondary | ICD-10-CM | POA: Diagnosis not present

## 2017-06-22 DIAGNOSIS — Z4781 Encounter for orthopedic aftercare following surgical amputation: Secondary | ICD-10-CM | POA: Diagnosis not present

## 2017-06-22 DIAGNOSIS — I509 Heart failure, unspecified: Secondary | ICD-10-CM | POA: Diagnosis not present

## 2017-06-23 ENCOUNTER — Ambulatory Visit (INDEPENDENT_AMBULATORY_CARE_PROVIDER_SITE_OTHER): Payer: Medicare Other | Admitting: Family

## 2017-06-28 ENCOUNTER — Ambulatory Visit (INDEPENDENT_AMBULATORY_CARE_PROVIDER_SITE_OTHER): Payer: Medicare Other | Admitting: Family

## 2017-06-28 ENCOUNTER — Encounter (INDEPENDENT_AMBULATORY_CARE_PROVIDER_SITE_OTHER): Payer: Self-pay | Admitting: Family

## 2017-06-28 VITALS — Ht 70.0 in | Wt 324.0 lb

## 2017-06-28 DIAGNOSIS — T8781 Dehiscence of amputation stump: Secondary | ICD-10-CM

## 2017-06-28 DIAGNOSIS — Z89512 Acquired absence of left leg below knee: Secondary | ICD-10-CM

## 2017-06-28 MED ORDER — SULFAMETHOXAZOLE-TRIMETHOPRIM 800-160 MG PO TABS: 1.0000 | ORAL_TABLET | Freq: Two times a day (BID) | ORAL | 0 refills | Status: DC

## 2017-06-28 MED ORDER — MUPIROCIN 2 % EX OINT: 1.0000 "application " | TOPICAL_OINTMENT | Freq: Two times a day (BID) | CUTANEOUS | 6 refills | Status: DC

## 2017-06-28 MED ORDER — OXYCODONE-ACETAMINOPHEN 5-325 MG PO TABS: 1.0000 | ORAL_TABLET | Freq: Three times a day (TID) | ORAL | 0 refills | Status: DC | PRN

## 2017-06-28 NOTE — Progress Notes (Signed)
Post-Op Visit Note   Patient: [name] [name]           Date of Birth: November 18, 1975           MRN: [id number] Visit Date: 06/28/2017 PCP: Colbert Ewing, MD  Chief Complaint:  Chief Complaint  Patient presents with  . Left Leg - Routine Post Op    06/09/17 left BKA revision     HPI:  HPI The patient is a 42 year old woman seen for evaluation of left below knee amputation revision on 06/09/17.  The patient is concerned that her incision is opening up medially.  Having some serosanguineous drainage.  Ortho Exam Incision approximated with sutures.  Laterally is well-healed.  Medially there is an area about 4 cm in length that has yet to heal there is serosanguineous drainage.  There is no gaping however has yet to heal.  There is granulation tissue no purulence or odor. No surrounding erythema. No sign of infection.  Visit Diagnoses:  1. Status post below knee amputation, left ([place])   2. Morbid obesity due to excess calories ([place])   3. Dehiscence of amputation stump (HCC)     Plan: Continue daily dry dressing changes following wound cleansing. Follow up in office in 2 weeks.   Follow-Up Instructions: Return in about 8 days (around 07/06/2017).   Imaging: No results found.  Orders:  No orders of the defined types were placed in this encounter.  No orders of the defined types were placed in this encounter.    PMFS History: Patient Active Problem List   Diagnosis Date Noted  . Dehiscence of amputation stump ([place])   . Fall   . Stage 3 chronic kidney disease ([place])   . Peripheral edema   . Poorly controlled type 2 diabetes mellitus with peripheral neuropathy ([place])   . Status post below knee amputation, left ([place]) 04/11/2017  . Post-operative pain   . Bipolar affective disorder ([place])   . Type 2 diabetes mellitus with peripheral neuropathy (HCC)   . OSA (obstructive sleep apnea)   . Super obese   . Leukocytosis   . Acute blood loss anemia   . Adjustment disorder with  depressed mood 03/26/2017  . Severe sepsis ([place]) 03/24/2017  . Abnormal ECG 03/24/2017  . Acute bilateral low back pain 02/13/2017  . Heart failure with preserved ejection fraction ([place]) 02/09/2017  . Jerking movements of extremities 12/08/2016  . Routine adult health maintenance 12/08/2016  . Chronic pain of right knee 09/08/2016  . Abdominal muscle pain 09/08/2016  . Vertigo 08/05/2016  . Lightheadedness 07/15/2016  . CKD (chronic kidney disease) stage 2, GFR 60-89 ml/min 07/15/2016  . Bilateral lower extremity edema 05/13/2016  . OSA on CPAP 05/13/2016  . Vaginal pruritus 03/14/2016  . Morbid obesity due to excess calories ([place]) 11/27/2015  . Callus of foot 11/26/2015  . History of Low serum cortisol level ([place]) 01/07/2015  . Financial difficulty 04/25/2013  . Uncontrolled type 2 diabetes mellitus ([place]) 07/12/2012  . Necrotizing fasciitis s/p OR debridements 07/06/2012  . Carpal tunnel syndrome, bilateral 01/25/2012  . Diabetic peripheral neuropathy ([place]) 07/04/2011  . Anxiety and depression 06/02/2011  . History of abnormal cervical Pap smear 12/13/2007  . GERD 12/06/2007  . Hyperlipidemia 08/29/2007  . Hypertension associated with diabetes ([place]) 08/29/2007   Past Medical History:  Diagnosis Date  . Abnormal Pap smear of cervix 2009  . Abscess    history of multiple abscesses  . Anemia of chronic disease 2002  .  Anxiety    Panic attacks  . Bipolar disorder ([place])   . Cellulitis 05/21/2014   right eye  . Chronic bronchitis ([place])    "get it q yr" (05/13/2013)  . Chronic pain   . Depression   . Edema of lower extremity   . Endocarditis 2002   subacute bacterial endocarditis.   . Family history of anesthesia complication    "my mom has a hard time coming out from under"  . Fibromyalgia   . GERD (gastroesophageal reflux disease)    occ  . Heart murmur   . History of blood transfusion    "just low blood count" (05/13/2013)  . Hyperlipidemia   . Hypertension   .  Hypothyroidism   . Hypothyroidism, adult 03/21/2014  . Necrosis ([place])    and ulceration  . Obesity   . OSA on CPAP    does not wear CPAP  . Peripheral neuropathy   . Type II diabetes mellitus (HCC)    Type  II    Family History  Problem Relation Age of Onset  . Heart failure Mother   . Diabetes Mother   . Kidney disease Mother   . Kidney disease Father   . Diabetes Father   . Diabetes Paternal Grandmother   . Heart failure Paternal Grandmother   . Other Other     Past Surgical History:  Procedure Laterality Date  . AMPUTATION Left 04/07/2017   Procedure: LEFT BELOW KNEE AMPUTATION;  Surgeon: Newt Minion, MD;  Location: [facility];  Service: Orthopedics;  Laterality: Left;  . EYE SURGERY     lazer  . INCISION AND DRAINAGE ABSCESS     multiple I&Ds  . INCISION AND DRAINAGE ABSCESS Left 07/09/2012   Procedure: DRESSING CHANGE, THIGH WOUND;  Surgeon: Harl Bowie, MD;  Location: [facility];  Service: General;  Laterality: Left;  . INCISION AND DRAINAGE OF WOUND Left 07/07/2012   Procedure: IRRIGATION AND DEBRIDEMENT WOUND;  Surgeon: Harl Bowie, MD;  Location: [facility];  Service: General;  Laterality: Left;  . INCISION AND DRAINAGE PERIRECTAL ABSCESS Left 07/14/2012   Procedure: DEBRIDEMENT OF SKIN & SOFT TISSUE; DRESSING CHANGE UNDER ANESTHESIA;  Surgeon: Gayland Curry, MD,FACS;  Location: [facility];  Service: General;  Laterality: Left;  . INCISION AND DRAINAGE PERIRECTAL ABSCESS Left 07/16/2012   Procedure: I&D Left Thigh;  Surgeon: Gwenyth Ober, MD;  Location: [facility];  Service: General;  Laterality: Left;  . INCISION AND DRAINAGE PERIRECTAL ABSCESS N/A 01/05/2015   Procedure: IRRIGATION AND DEBRIDEMENT PERIRECTAL ABSCESS;  Surgeon: Donnie Mesa, MD;  Location: [facility];  Service: General;  Laterality: N/A;  . IRRIGATION AND DEBRIDEMENT ABSCESS Left 07/06/2012   Procedure: IRRIGATION AND DEBRIDEMENT ABSCESS BUTTOCKS AND THIGH;  Surgeon: Shann Medal, MD;  Location: [facility];  Service:  General;  Laterality: Left;  . IRRIGATION AND DEBRIDEMENT ABSCESS Left 08/10/2012   Procedure: IRRIGATION AND DEBRIDEMENT ABSCESS;  Surgeon: Madilyn Hook, DO;  Location: [facility];  Service: General;  Laterality: Left;  . STUMP REVISION Left 06/09/2017   Procedure: REVISION LEFT BELOW KNEE AMPUTATION;  Surgeon: Newt Minion, MD;  Location: [facility];  Service: Orthopedics;  Laterality: Left;  . TONSILLECTOMY  1994   Social History   Occupational History  . Occupation: disability  Tobacco Use  . Smoking status: Current Every Day Smoker    Packs/day: 0.50    Years: 15.00    Pack years: 7.50    Types: Cigarettes  . Smokeless  tobacco: Never Used  . Tobacco comment: Cutting back.   Substance and Sexual Activity  . Alcohol use: Yes    Alcohol/week: 0.0 oz    Comment: Socially-" monthy  maybe"  . Drug use: No  . Sexual activity: Yes

## 2017-06-28 NOTE — Addendum Note (Signed)
Addended by: Dondra Prader R on: 06/28/2017 02:20 PM   Modules accepted: Orders

## 2017-06-29 ENCOUNTER — Telehealth (INDEPENDENT_AMBULATORY_CARE_PROVIDER_SITE_OTHER): Payer: Self-pay

## 2017-06-29 DIAGNOSIS — I509 Heart failure, unspecified: Secondary | ICD-10-CM | POA: Diagnosis not present

## 2017-06-29 DIAGNOSIS — N183 Chronic kidney disease, stage 3 (moderate): Secondary | ICD-10-CM | POA: Diagnosis not present

## 2017-06-29 DIAGNOSIS — Z4781 Encounter for orthopedic aftercare following surgical amputation: Secondary | ICD-10-CM | POA: Diagnosis not present

## 2017-06-29 DIAGNOSIS — I130 Hypertensive heart and chronic kidney disease with heart failure and stage 1 through stage 4 chronic kidney disease, or unspecified chronic kidney disease: Secondary | ICD-10-CM | POA: Diagnosis not present

## 2017-06-29 DIAGNOSIS — D631 Anemia in chronic kidney disease: Secondary | ICD-10-CM | POA: Diagnosis not present

## 2017-06-29 DIAGNOSIS — E1122 Type 2 diabetes mellitus with diabetic chronic kidney disease: Secondary | ICD-10-CM | POA: Diagnosis not present

## 2017-06-29 NOTE — Telephone Encounter (Signed)
Called and sw HHN  per Erin Bactroban added to wound care orders.

## 2017-06-29 NOTE — Telephone Encounter (Signed)
Nurse with [facility] called stating that patient was seen yesterday in the office and advised her that she had a change in would care orders. She would like to know if someone could call her to give verbal order change. [phone]

## 2017-06-30 DIAGNOSIS — N183 Chronic kidney disease, stage 3 (moderate): Secondary | ICD-10-CM | POA: Diagnosis not present

## 2017-06-30 DIAGNOSIS — E1122 Type 2 diabetes mellitus with diabetic chronic kidney disease: Secondary | ICD-10-CM | POA: Diagnosis not present

## 2017-06-30 DIAGNOSIS — D631 Anemia in chronic kidney disease: Secondary | ICD-10-CM | POA: Diagnosis not present

## 2017-06-30 DIAGNOSIS — I130 Hypertensive heart and chronic kidney disease with heart failure and stage 1 through stage 4 chronic kidney disease, or unspecified chronic kidney disease: Secondary | ICD-10-CM | POA: Diagnosis not present

## 2017-06-30 DIAGNOSIS — I509 Heart failure, unspecified: Secondary | ICD-10-CM | POA: Diagnosis not present

## 2017-06-30 DIAGNOSIS — Z4781 Encounter for orthopedic aftercare following surgical amputation: Secondary | ICD-10-CM | POA: Diagnosis not present

## 2017-07-05 ENCOUNTER — Ambulatory Visit (INDEPENDENT_AMBULATORY_CARE_PROVIDER_SITE_OTHER): Payer: Medicare Other | Admitting: Family

## 2017-07-06 ENCOUNTER — Telehealth: Payer: Self-pay | Admitting: Internal Medicine

## 2017-07-06 ENCOUNTER — Ambulatory Visit (INDEPENDENT_AMBULATORY_CARE_PROVIDER_SITE_OTHER): Payer: Medicare Other | Admitting: Family

## 2017-07-06 ENCOUNTER — Telehealth (INDEPENDENT_AMBULATORY_CARE_PROVIDER_SITE_OTHER): Payer: Self-pay | Admitting: Orthopedic Surgery

## 2017-07-06 DIAGNOSIS — I130 Hypertensive heart and chronic kidney disease with heart failure and stage 1 through stage 4 chronic kidney disease, or unspecified chronic kidney disease: Secondary | ICD-10-CM | POA: Diagnosis not present

## 2017-07-06 DIAGNOSIS — E1122 Type 2 diabetes mellitus with diabetic chronic kidney disease: Secondary | ICD-10-CM | POA: Diagnosis not present

## 2017-07-06 DIAGNOSIS — D631 Anemia in chronic kidney disease: Secondary | ICD-10-CM | POA: Diagnosis not present

## 2017-07-06 DIAGNOSIS — Z4781 Encounter for orthopedic aftercare following surgical amputation: Secondary | ICD-10-CM | POA: Diagnosis not present

## 2017-07-06 DIAGNOSIS — I509 Heart failure, unspecified: Secondary | ICD-10-CM | POA: Diagnosis not present

## 2017-07-06 DIAGNOSIS — N183 Chronic kidney disease, stage 3 (moderate): Secondary | ICD-10-CM | POA: Diagnosis not present

## 2017-07-06 NOTE — Telephone Encounter (Signed)
Alexis w/[facility] called to state to clarify which antibiotics the patient should be taking  Her # [phone](Alexis)  Please call her to advise. States it does not appear patient had been taking any.

## 2017-07-06 NOTE — Telephone Encounter (Signed)
Alexis [phone] from [facility];  BP 170/100 Pt is having problem is having issue with her bills, they have a Education officer, museum, she is needing a verbal order

## 2017-07-06 NOTE — Telephone Encounter (Signed)
I called and lm on vm to advise that pt should have been taking keflex 500mg  tid. To call back with questions.

## 2017-07-07 NOTE — Telephone Encounter (Signed)
Called alexis this am

## 2017-07-12 DIAGNOSIS — I130 Hypertensive heart and chronic kidney disease with heart failure and stage 1 through stage 4 chronic kidney disease, or unspecified chronic kidney disease: Secondary | ICD-10-CM | POA: Diagnosis not present

## 2017-07-12 DIAGNOSIS — Z4781 Encounter for orthopedic aftercare following surgical amputation: Secondary | ICD-10-CM | POA: Diagnosis not present

## 2017-07-12 DIAGNOSIS — E1122 Type 2 diabetes mellitus with diabetic chronic kidney disease: Secondary | ICD-10-CM | POA: Diagnosis not present

## 2017-07-12 DIAGNOSIS — I509 Heart failure, unspecified: Secondary | ICD-10-CM | POA: Diagnosis not present

## 2017-07-12 DIAGNOSIS — D631 Anemia in chronic kidney disease: Secondary | ICD-10-CM | POA: Diagnosis not present

## 2017-07-12 DIAGNOSIS — N183 Chronic kidney disease, stage 3 (moderate): Secondary | ICD-10-CM | POA: Diagnosis not present

## 2017-07-17 ENCOUNTER — Encounter (INDEPENDENT_AMBULATORY_CARE_PROVIDER_SITE_OTHER): Payer: Self-pay | Admitting: Orthopedic Surgery

## 2017-07-17 ENCOUNTER — Ambulatory Visit (INDEPENDENT_AMBULATORY_CARE_PROVIDER_SITE_OTHER): Payer: Medicare Other | Admitting: Orthopedic Surgery

## 2017-07-17 VITALS — Ht 70.0 in | Wt 324.0 lb

## 2017-07-17 DIAGNOSIS — T8781 Dehiscence of amputation stump: Secondary | ICD-10-CM

## 2017-07-17 MED ORDER — OXYCODONE-ACETAMINOPHEN 5-325 MG PO TABS: 1.0000 | ORAL_TABLET | Freq: Three times a day (TID) | ORAL | 0 refills | Status: DC | PRN

## 2017-07-17 NOTE — Progress Notes (Signed)
Office Visit Note   Patient: [name] [name]           Date of Birth: 06-06-1975           MRN: [id number] Visit Date: 07/17/2017              Requested by: Colbert Ewing, MD [address] [place], [place] [address] PCP: Colbert Ewing, MD  Chief Complaint  Patient presents with  . Left Leg - Routine Post Op    Left BKA revision  BKA 06/09/17      HPI: Patient presents 5 weeks status post revision left transtibial amputation patient does report some pain she has clear drainage she is currently on Bactrim DS and Bactroban dressing changes with Dial soap cleansing daily.  Assessment & Plan: Visit Diagnoses:  1. Dehiscence of amputation stump ([place])     Plan: We will harvest the sutures today continue with current treatment follow-up in 1 week.  If we are not showing any improvement we may need to consider revision surgery.  Follow-Up Instructions: Return in about 1 week (around 07/24/2017).   Ortho Exam  Patient is alert, oriented, no adenopathy, well-dressed, normal affect, normal respiratory effort. Examination patient has healthy granulation tissue around the wound there was a very small opening about 2 mm in diameter.  The incision has healed well we will harvest the sutures continue with dressing changes  Imaging: No results found. No images are attached to the encounter.  Labs: Lab Results  Component Value Date   HGBA1C 6.5 (H) 06/09/2017   HGBA1C 9.0 (H) 03/25/2017   HGBA1C 11.3 (H) 11/21/2016   ESRSEDRATE 130 (H) 03/24/2017   ESRSEDRATE 53 (H) 09/14/2015   ESRSEDRATE 132 (H) 07/06/2012   CRP 5.4 (H) 09/14/2015   CRP 23.0 (H) 07/06/2012   REPTSTATUS 06/14/2017 FINAL 06/09/2017   GRAMSTAIN  06/09/2017    DEGENERATED CELLULAR MATERIAL PRESENT FEW GRAM POSITIVE COCCI IN PAIRS    CULT  06/09/2017    MODERATE GROUP B STREP(S.AGALACTIAE)ISOLATED TESTING AGAINST S. AGALACTIAE NOT ROUTINELY PERFORMED DUE TO PREDICTABILITY OF AMP/PEN/VAN  SUSCEPTIBILITY. MODERATE ENTEROCOCCUS FAECALIS MODERATE PREVOTELLA DENTICOLA BETA LACTAMASE POSITIVE MODERATE PEPTOSTREPTOCOCCUS SPECIES    LABORGA ENTEROCOCCUS FAECALIS 06/09/2017    @LABSALLVALUES (HGBA1)@  Body mass index is 46.49 kg/m.  Orders:  No orders of the defined types were placed in this encounter.  Meds ordered this encounter  Medications  . oxyCODONE-acetaminophen (PERCOCET/ROXICET) 5-325 MG tablet    Sig: Take 1 tablet by mouth every 8 (eight) hours as needed for severe pain.    Dispense:  30 tablet    Refill:  0     Procedures: No procedures performed  Clinical Data: No additional findings.  ROS:  All other systems negative, except as noted in the HPI. Review of Systems  Objective: Vital Signs: Ht 5\' 10"  (1.778 m)   Wt (!) 324 lb (147 kg)   BMI 46.49 kg/m   Specialty Comments:  No specialty comments available.  PMFS History: Patient Active Problem List   Diagnosis Date Noted  . Dehiscence of amputation stump ([place])   . Fall   . Stage 3 chronic kidney disease ([place])   . Peripheral edema   . Poorly controlled type 2 diabetes mellitus with peripheral neuropathy ([place])   . Status post below knee amputation, left ([place]) 04/11/2017  . Post-operative pain   . Bipolar affective disorder ([place])   . Type 2 diabetes mellitus with peripheral neuropathy (HCC)   . OSA (obstructive sleep apnea)   .  Super obese   . Leukocytosis   . Acute blood loss anemia   . Adjustment disorder with depressed mood 03/26/2017  . Severe sepsis ([place]) 03/24/2017  . Abnormal ECG 03/24/2017  . Acute bilateral low back pain 02/13/2017  . Heart failure with preserved ejection fraction ([place]) 02/09/2017  . Jerking movements of extremities 12/08/2016  . Routine adult health maintenance 12/08/2016  . Chronic pain of right knee 09/08/2016  . Abdominal muscle pain 09/08/2016  . Vertigo 08/05/2016  . Lightheadedness 07/15/2016  . CKD (chronic kidney disease) stage 2, GFR 60-89  ml/min 07/15/2016  . Bilateral lower extremity edema 05/13/2016  . OSA on CPAP 05/13/2016  . Vaginal pruritus 03/14/2016  . Morbid obesity due to excess calories ([place]) 11/27/2015  . Callus of foot 11/26/2015  . History of Low serum cortisol level ([place]) 01/07/2015  . Financial difficulty 04/25/2013  . Uncontrolled type 2 diabetes mellitus ([place]) 07/12/2012  . Necrotizing fasciitis s/p OR debridements 07/06/2012  . Carpal tunnel syndrome, bilateral 01/25/2012  . Diabetic peripheral neuropathy ([place]) 07/04/2011  . Anxiety and depression 06/02/2011  . History of abnormal cervical Pap smear 12/13/2007  . GERD 12/06/2007  . Hyperlipidemia 08/29/2007  . Hypertension associated with diabetes ([place]) 08/29/2007   Past Medical History:  Diagnosis Date  . Abnormal Pap smear of cervix 2009  . Abscess    history of multiple abscesses  . Anemia of chronic disease 2002  . Anxiety    Panic attacks  . Bipolar disorder ([place])   . Cellulitis 05/21/2014   right eye  . Chronic bronchitis ([place])    "get it q yr" (05/13/2013)  . Chronic pain   . Depression   . Edema of lower extremity   . Endocarditis 2002   subacute bacterial endocarditis.   . Family history of anesthesia complication    "my mom has a hard time coming out from under"  . Fibromyalgia   . GERD (gastroesophageal reflux disease)    occ  . Heart murmur   . History of blood transfusion    "just low blood count" (05/13/2013)  . Hyperlipidemia   . Hypertension   . Hypothyroidism   . Hypothyroidism, adult 03/21/2014  . Necrosis ([place])    and ulceration  . Obesity   . OSA on CPAP    does not wear CPAP  . Peripheral neuropathy   . Type II diabetes mellitus (HCC)    Type  II    Family History  Problem Relation Age of Onset  . Heart failure Mother   . Diabetes Mother   . Kidney disease Mother   . Kidney disease Father   . Diabetes Father   . Diabetes Paternal Grandmother   . Heart failure Paternal Grandmother   . Other Other      Past Surgical History:  Procedure Laterality Date  . AMPUTATION Left 04/07/2017   Procedure: LEFT BELOW KNEE AMPUTATION;  Surgeon: Newt Minion, MD;  Location: [facility];  Service: Orthopedics;  Laterality: Left;  . EYE SURGERY     lazer  . INCISION AND DRAINAGE ABSCESS     multiple I&Ds  . INCISION AND DRAINAGE ABSCESS Left 07/09/2012   Procedure: DRESSING CHANGE, THIGH WOUND;  Surgeon: Harl Bowie, MD;  Location: [facility];  Service: General;  Laterality: Left;  . INCISION AND DRAINAGE OF WOUND Left 07/07/2012   Procedure: IRRIGATION AND DEBRIDEMENT WOUND;  Surgeon: Harl Bowie, MD;  Location: [facility];  Service: General;  Laterality: Left;  .  INCISION AND DRAINAGE PERIRECTAL ABSCESS Left 07/14/2012   Procedure: DEBRIDEMENT OF SKIN & SOFT TISSUE; DRESSING CHANGE UNDER ANESTHESIA;  Surgeon: Gayland Curry, MD,FACS;  Location: [facility];  Service: General;  Laterality: Left;  . INCISION AND DRAINAGE PERIRECTAL ABSCESS Left 07/16/2012   Procedure: I&D Left Thigh;  Surgeon: Gwenyth Ober, MD;  Location: [facility];  Service: General;  Laterality: Left;  . INCISION AND DRAINAGE PERIRECTAL ABSCESS N/A 01/05/2015   Procedure: IRRIGATION AND DEBRIDEMENT PERIRECTAL ABSCESS;  Surgeon: Donnie Mesa, MD;  Location: [facility];  Service: General;  Laterality: N/A;  . IRRIGATION AND DEBRIDEMENT ABSCESS Left 07/06/2012   Procedure: IRRIGATION AND DEBRIDEMENT ABSCESS BUTTOCKS AND THIGH;  Surgeon: Shann Medal, MD;  Location: [facility];  Service: General;  Laterality: Left;  . IRRIGATION AND DEBRIDEMENT ABSCESS Left 08/10/2012   Procedure: IRRIGATION AND DEBRIDEMENT ABSCESS;  Surgeon: Madilyn Hook, DO;  Location: [facility];  Service: General;  Laterality: Left;  . STUMP REVISION Left 06/09/2017   Procedure: REVISION LEFT BELOW KNEE AMPUTATION;  Surgeon: Newt Minion, MD;  Location: [facility];  Service: Orthopedics;  Laterality: Left;  . TONSILLECTOMY  1994   Social History   Occupational History  . Occupation: disability   Tobacco Use  . Smoking status: Current Every Day Smoker    Packs/day: 0.50    Years: 15.00    Pack years: 7.50    Types: Cigarettes  . Smokeless tobacco: Never Used  . Tobacco comment: Cutting back.   Substance and Sexual Activity  . Alcohol use: Yes    Alcohol/week: 0.0 oz    Comment: Socially-" monthy  maybe"  . Drug use: No  . Sexual activity: Yes

## 2017-07-19 ENCOUNTER — Telehealth: Payer: Self-pay | Admitting: Internal Medicine

## 2017-07-19 DIAGNOSIS — D631 Anemia in chronic kidney disease: Secondary | ICD-10-CM | POA: Diagnosis not present

## 2017-07-19 DIAGNOSIS — Z4781 Encounter for orthopedic aftercare following surgical amputation: Secondary | ICD-10-CM | POA: Diagnosis not present

## 2017-07-19 DIAGNOSIS — N183 Chronic kidney disease, stage 3 (moderate): Secondary | ICD-10-CM | POA: Diagnosis not present

## 2017-07-19 DIAGNOSIS — I130 Hypertensive heart and chronic kidney disease with heart failure and stage 1 through stage 4 chronic kidney disease, or unspecified chronic kidney disease: Secondary | ICD-10-CM | POA: Diagnosis not present

## 2017-07-19 DIAGNOSIS — E1122 Type 2 diabetes mellitus with diabetic chronic kidney disease: Secondary | ICD-10-CM | POA: Diagnosis not present

## 2017-07-19 DIAGNOSIS — I509 Heart failure, unspecified: Secondary | ICD-10-CM | POA: Diagnosis not present

## 2017-07-19 NOTE — Telephone Encounter (Signed)
Pt has appt dr Ronalee Red 2/28 at 1345

## 2017-07-19 NOTE — Telephone Encounter (Signed)
[name] [name] from [facility] [phone]; pt bp 140/90 heartrate 99, pt is looking like she holding fluids. Pt has no scale, it hard to weight patient. She ab has grown 3cm since last week

## 2017-07-20 ENCOUNTER — Encounter: Payer: Self-pay | Admitting: Internal Medicine

## 2017-07-20 ENCOUNTER — Other Ambulatory Visit: Payer: Self-pay | Admitting: Internal Medicine

## 2017-07-21 ENCOUNTER — Other Ambulatory Visit: Payer: Self-pay | Admitting: *Deleted

## 2017-07-21 NOTE — Telephone Encounter (Signed)
Next appt scheduled 4/18 with PCP. ?

## 2017-07-22 MED ORDER — CYCLOBENZAPRINE HCL 10 MG PO TABS: 10.0000 mg | ORAL_TABLET | Freq: Two times a day (BID) | ORAL | 0 refills | Status: DC | PRN

## 2017-07-22 MED ORDER — LIRAGLUTIDE 18 MG/3ML ~~LOC~~ SOPN: 1.8000 mg | PEN_INJECTOR | Freq: Every day | SUBCUTANEOUS | 2 refills | Status: DC

## 2017-07-26 DIAGNOSIS — I130 Hypertensive heart and chronic kidney disease with heart failure and stage 1 through stage 4 chronic kidney disease, or unspecified chronic kidney disease: Secondary | ICD-10-CM | POA: Diagnosis not present

## 2017-07-26 DIAGNOSIS — I509 Heart failure, unspecified: Secondary | ICD-10-CM | POA: Diagnosis not present

## 2017-07-26 DIAGNOSIS — E1122 Type 2 diabetes mellitus with diabetic chronic kidney disease: Secondary | ICD-10-CM | POA: Diagnosis not present

## 2017-07-26 DIAGNOSIS — Z4781 Encounter for orthopedic aftercare following surgical amputation: Secondary | ICD-10-CM | POA: Diagnosis not present

## 2017-07-26 DIAGNOSIS — N183 Chronic kidney disease, stage 3 (moderate): Secondary | ICD-10-CM | POA: Diagnosis not present

## 2017-07-26 DIAGNOSIS — D631 Anemia in chronic kidney disease: Secondary | ICD-10-CM | POA: Diagnosis not present

## 2017-07-27 ENCOUNTER — Encounter (INDEPENDENT_AMBULATORY_CARE_PROVIDER_SITE_OTHER): Payer: Self-pay | Admitting: Orthopedic Surgery

## 2017-07-27 ENCOUNTER — Ambulatory Visit (INDEPENDENT_AMBULATORY_CARE_PROVIDER_SITE_OTHER): Payer: Medicare Other | Admitting: Orthopedic Surgery

## 2017-07-27 VITALS — Ht 70.0 in | Wt 324.0 lb

## 2017-07-27 DIAGNOSIS — T8781 Dehiscence of amputation stump: Secondary | ICD-10-CM

## 2017-07-27 DIAGNOSIS — Z89512 Acquired absence of left leg below knee: Secondary | ICD-10-CM

## 2017-07-27 MED ORDER — OXYCODONE-ACETAMINOPHEN 5-325 MG PO TABS: 1.0000 | ORAL_TABLET | Freq: Three times a day (TID) | ORAL | 0 refills | Status: DC | PRN

## 2017-07-27 NOTE — Progress Notes (Signed)
Office Visit Note   Patient: [name] [name]           Date of Birth: [birth year]           MRN: [id number] Visit Date: 07/27/2017              Requested by: Colbert Ewing, MD [address] [place], [place] [address] PCP: Colbert Ewing, MD  Chief Complaint  Patient presents with  . Left Leg - Routine Post Op    06/09/17 left BKA revision      HPI: Patient presents 7 weeks status post left transtibial amputation.  Assessment & Plan: Visit Diagnoses:  1. Dehiscence of amputation stump ([place])   2. Status post below knee amputation, left ([place])     Plan: Patient will continue with dry dressing and the stump shrinker.  She will need a new stump shrinker.  Examination patient is showing  Follow-Up Instructions: Return in about 3 weeks (around 08/17/2017).   Ortho Exam  Patient is alert, oriented, no adenopathy, well-dressed, normal affect, normal respiratory effort. Good improvement in the wound healing though she still has significant swelling.  The wound area is 5 mm x 15 mm and 1 mm deep with healthy granulation tissue there is clear serous drainage.  There is also another blister which has superficial epithelialization there is no redness no cellulitis no sign of infection no tenderness to palpation.  Imaging: No results found. No images are attached to the encounter.  Labs: Lab Results  Component Value Date   HGBA1C 6.5 (H) 06/09/2017   HGBA1C 9.0 (H) 03/25/2017   HGBA1C 11.3 (H) 11/21/2016   ESRSEDRATE 130 (H) 03/24/2017   ESRSEDRATE 53 (H) 09/14/2015   ESRSEDRATE 132 (H) 07/06/2012   CRP 5.4 (H) 09/14/2015   CRP 23.0 (H) 07/06/2012   REPTSTATUS 06/14/2017 FINAL 06/09/2017   GRAMSTAIN  06/09/2017    DEGENERATED CELLULAR MATERIAL PRESENT FEW GRAM POSITIVE COCCI IN PAIRS    CULT  06/09/2017    MODERATE GROUP B STREP(S.AGALACTIAE)ISOLATED TESTING AGAINST S. AGALACTIAE NOT ROUTINELY PERFORMED DUE TO PREDICTABILITY OF AMP/PEN/VAN SUSCEPTIBILITY. MODERATE  ENTEROCOCCUS FAECALIS MODERATE PREVOTELLA DENTICOLA BETA LACTAMASE POSITIVE MODERATE PEPTOSTREPTOCOCCUS SPECIES    LABORGA ENTEROCOCCUS FAECALIS 06/09/2017    @LABSALLVALUES (HGBA1)@  Body mass index is 46.49 kg/m.  Orders:  No orders of the defined types were placed in this encounter.  No orders of the defined types were placed in this encounter.    Procedures: No procedures performed  Clinical Data: No additional findings.  ROS:  All other systems negative, except as noted in the HPI. Review of Systems  Objective: Vital Signs: Ht 5\' 10"  (1.778 m)   Wt (!) 324 lb (147 kg)   BMI 46.49 kg/m   Specialty Comments:  No specialty comments available.  PMFS History: Patient Active Problem List   Diagnosis Date Noted  . Dehiscence of amputation stump ([place])   . Fall   . Stage 3 chronic kidney disease ([place])   . Peripheral edema   . Poorly controlled type 2 diabetes mellitus with peripheral neuropathy ([place])   . Status post below knee amputation, left ([place]) 04/11/2017  . Post-operative pain   . Bipolar affective disorder ([place])   . Type 2 diabetes mellitus with peripheral neuropathy (H[facility])   . OSA (obstructive sleep apnea)   . Super obese   . Leukocytosis   . Acute blood loss anemia   . Adjustment disorder with depressed mood 03/26/2017  . Severe sepsis ([place]) 03/24/2017  .  Abnormal ECG 03/24/2017  . Acute bilateral low back pain 02/13/2017  . Heart failure with preserved ejection fraction ([place]) 02/09/2017  . Jerking movements of extremities 12/08/2016  . Routine adult health maintenance 12/08/2016  . Chronic pain of right knee 09/08/2016  . Abdominal muscle pain 09/08/2016  . Vertigo 08/05/2016  . Lightheadedness 07/15/2016  . CKD (chronic kidney disease) stage 2, GFR 60-89 ml/min 07/15/2016  . Bilateral lower extremity edema 05/13/2016  . OSA on CPAP 05/13/2016  . Vaginal pruritus 03/14/2016  . Morbid obesity due to excess calories ([place]) 11/27/2015  . Callus  of foot 11/26/2015  . History of Low serum cortisol level ([place]) 01/07/2015  . Financial difficulty 04/25/2013  . Uncontrolled type 2 diabetes mellitus ([place]) 07/12/2012  . Necrotizing fasciitis s/p OR debridements 07/06/2012  . Carpal tunnel syndrome, bilateral 01/25/2012  . Diabetic peripheral neuropathy ([place]) 07/04/2011  . Anxiety and depression 06/02/2011  . History of abnormal cervical Pap smear 12/13/2007  . GERD 12/06/2007  . Hyperlipidemia 08/29/2007  . Hypertension associated with diabetes ([place]) 08/29/2007   Past Medical History:  Diagnosis Date  . Abnormal Pap smear of cervix 2009  . Abscess    history of multiple abscesses  . Anemia of chronic disease 2002  . Anxiety    Panic attacks  . Bipolar disorder ([place])   . Cellulitis 05/21/2014   right eye  . Chronic bronchitis ([place])    "get it q yr" (05/13/2013)  . Chronic pain   . Depression   . Edema of lower extremity   . Endocarditis 2002   subacute bacterial endocarditis.   . Family history of anesthesia complication    "my mom has a hard time coming out from under"  . Fibromyalgia   . GERD (gastroesophageal reflux disease)    occ  . Heart murmur   . History of blood transfusion    "just low blood count" (05/13/2013)  . Hyperlipidemia   . Hypertension   . Hypothyroidism   . Hypothyroidism, adult 03/21/2014  . Necrosis ([place])    and ulceration  . Obesity   . OSA on CPAP    does not wear CPAP  . Peripheral neuropathy   . Type II diabetes mellitus ([facility])    Type  II    Family History  Problem Relation Age of Onset  . Heart failure Mother   . Diabetes Mother   . Kidney disease Mother   . Kidney disease Father   . Diabetes Father   . Diabetes Paternal Grandmother   . Heart failure Paternal Grandmother   . Other Other     Past Surgical History:  Procedure Laterality Date  . AMPUTATION Left 04/07/2017   Procedure: LEFT BELOW KNEE AMPUTATION;  Surgeon: Newt Minion, MD;  Location: [facility];  Service:  Orthopedics;  Laterality: Left;  . EYE SURGERY     [facility]  . INCISION AND DRAINAGE ABSCESS     multiple I&Ds  . INCISION AND DRAINAGE ABSCESS Left 07/09/2012   Procedure: DRESSING CHANGE, THIGH WOUND;  Surgeon: Harl Bowie, MD;  Location: [facility];  Service: General;  Laterality: Left;  . INCISION AND DRAINAGE OF WOUND Left 07/07/2012   Procedure: IRRIGATION AND DEBRIDEMENT WOUND;  Surgeon: Harl Bowie, MD;  Location: [facility];  Service: General;  Laterality: Left;  . INCISION AND DRAINAGE PERIRECTAL ABSCESS Left 07/14/2012   Procedure: DEBRIDEMENT OF SKIN & SOFT TISSUE; DRESSING CHANGE UNDER ANESTHESIA;  Surgeon: Gayland Curry, MD,FACS;  Location: [facility];  Service: General;  Laterality: Left;  . INCISION AND DRAINAGE PERIRECTAL ABSCESS Left 07/16/2012   Procedure: I&D Left Thigh;  Surgeon: Gwenyth Ober, MD;  Location: [facility];  Service: General;  Laterality: Left;  . INCISION AND DRAINAGE PERIRECTAL ABSCESS N/A 01/05/2015   Procedure: IRRIGATION AND DEBRIDEMENT PERIRECTAL ABSCESS;  Surgeon: Donnie Mesa, MD;  Location: [facility];  Service: General;  Laterality: N/A;  . IRRIGATION AND DEBRIDEMENT ABSCESS Left 07/06/2012   Procedure: IRRIGATION AND DEBRIDEMENT ABSCESS BUTTOCKS AND THIGH;  Surgeon: Shann Medal, MD;  Location: [facility];  Service: General;  Laterality: Left;  . IRRIGATION AND DEBRIDEMENT ABSCESS Left 08/10/2012   Procedure: IRRIGATION AND DEBRIDEMENT ABSCESS;  Surgeon: Madilyn Hook, DO;  Location: [facility];  Service: General;  Laterality: Left;  . STUMP REVISION Left 06/09/2017   Procedure: REVISION LEFT BELOW KNEE AMPUTATION;  Surgeon: Newt Minion, MD;  Location: [facility];  Service: Orthopedics;  Laterality: Left;  . TONSILLECTOMY  1994   Social History   Occupational History  . Occupation: disability  Tobacco Use  . Smoking status: Current Every Day Smoker    Packs/day: 0.50    Years: 15.00    Pack years: 7.50    Types: Cigarettes  . Smokeless tobacco: Never Used  . Tobacco  comment: Cutting back.   Substance and Sexual Activity  . Alcohol use: Yes    Alcohol/week: 0.0 oz    Comment: Socially-" monthy  maybe"  . Drug use: No  . Sexual activity: Yes

## 2017-07-31 ENCOUNTER — Other Ambulatory Visit: Payer: Self-pay

## 2017-07-31 ENCOUNTER — Encounter: Payer: Self-pay | Admitting: Internal Medicine

## 2017-07-31 ENCOUNTER — Ambulatory Visit (INDEPENDENT_AMBULATORY_CARE_PROVIDER_SITE_OTHER): Payer: Medicare Other | Admitting: Internal Medicine

## 2017-07-31 DIAGNOSIS — F339 Major depressive disorder, recurrent, unspecified: Secondary | ICD-10-CM | POA: Diagnosis not present

## 2017-07-31 DIAGNOSIS — F419 Anxiety disorder, unspecified: Secondary | ICD-10-CM | POA: Diagnosis not present

## 2017-07-31 DIAGNOSIS — Z79899 Other long term (current) drug therapy: Secondary | ICD-10-CM | POA: Diagnosis not present

## 2017-07-31 DIAGNOSIS — Z89512 Acquired absence of left leg below knee: Secondary | ICD-10-CM | POA: Diagnosis not present

## 2017-07-31 DIAGNOSIS — E1159 Type 2 diabetes mellitus with other circulatory complications: Secondary | ICD-10-CM

## 2017-07-31 DIAGNOSIS — R600 Localized edema: Secondary | ICD-10-CM

## 2017-07-31 DIAGNOSIS — I152 Hypertension secondary to endocrine disorders: Secondary | ICD-10-CM

## 2017-07-31 DIAGNOSIS — I10 Essential (primary) hypertension: Secondary | ICD-10-CM | POA: Diagnosis not present

## 2017-07-31 DIAGNOSIS — F329 Major depressive disorder, single episode, unspecified: Secondary | ICD-10-CM

## 2017-07-31 MED ORDER — FLUOXETINE HCL 10 MG PO CAPS: ORAL_CAPSULE | ORAL | 2 refills | Status: DC

## 2017-07-31 NOTE — Progress Notes (Signed)
CC: here for BP and depression  HPI:  [name] [name] is a 42 y.o. woman with a past medical history listed below here today for follow up of her HTN and depression.  For details of today's visit and the status of her chronic medical issues please refer to the assessment and plan.   Past Medical History:  Diagnosis Date  . Abnormal Pap smear of cervix 2009  . Abscess    history of multiple abscesses  . Anemia of chronic disease 2002  . Anxiety    Panic attacks  . Bipolar disorder ([place])   . Cellulitis 05/21/2014   right eye  . Chronic bronchitis ([place])    "get it q yr" (05/13/2013)  . Chronic pain   . Depression   . Edema of lower extremity   . Endocarditis 2002   subacute bacterial endocarditis.   . Family history of anesthesia complication    "my mom has a hard time coming out from under"  . Fibromyalgia   . GERD (gastroesophageal reflux disease)    occ  . Heart murmur   . History of blood transfusion    "just low blood count" (05/13/2013)  . Hyperlipidemia   . Hypertension   . Hypothyroidism   . Hypothyroidism, adult 03/21/2014  . Necrosis ([place])    and ulceration  . Obesity   . OSA on CPAP    does not wear CPAP  . Peripheral neuropathy   . Type II diabetes mellitus (HCC)    Type  II   Review of Systems:   Review of Systems  Constitutional: Negative for chills and fever.  Respiratory: Negative for shortness of breath.   Cardiovascular: Positive for leg swelling. Negative for chest pain and orthopnea.  Psychiatric/Behavioral: Positive for depression. Negative for suicidal ideas. The patient does not have insomnia.      Physical Exam:  Vitals:   07/31/17 1030  BP: (!) 164/88  Pulse: (!) 102  Temp: 98.3 F (36.8 C)  TempSrc: Oral  SpO2: 100%  Weight: (!) 328 lb 12.8 oz (149.1 kg)  Height: 5\' 10"  (1.778 m)   Physical Exam  Constitutional: She is oriented to person, place, and time.  Obese, AA woman, sitting in wheelchair, NAD  HENT:    Head: Normocephalic and atraumatic.  Musculoskeletal: She exhibits edema and deformity.  Left BKA  Neurological: She is alert and oriented to person, place, and time.  Skin: Skin is warm and dry.  Psychiatric: Mood and affect normal.  Tearful.    Assessment & Plan:   See Encounters Tab for problem based charting.  Patient discussed with Dr. Rebeca Alert.  Hypertension associated with diabetes ([place]) Her BP is elevated today at 164/88.  She is only taking Bumex 1 time per day instead of BID.  She reports feeling like she is "keeping fluid on" in her hands, legs, and abdomen.  On exam, she has some peripheral edema.  Her weight today is increased 4 pounds from previous.  She was taken off Lisinopril in Nov due to acute renal failure and hyperkalemia during her hospitalization.  Plan: - Recommended taking Bumex 1mg  BID as prescribed - Follow up with PCP scheduled on 4/18 - Repeat BMET then.  Last BMET with stable renal function and potassium - Her microalbumin-creatinine ratio is elevated and she would benefit from ACE/ARB therapy with her hx of DM.  If BP is stable at follow up next month, recommend re-initiating therapy at that time.  Did not do so  today in order to avoid doing too much at one visit.  Anxiety and depression Her PHQ today is 9.  She reports since amputation in Nov and passing of her mother in Dec that she is feeling overwhelmed, more depressed, and having trouble managing all of these life stressors.  She previously was on Prozac as well as Buspar and then Cymbalta at one time.  She recalls Prozac being effective.  She is also on Amitriptyline which helps her sleep but is not doing much for her mood.  She has been to counseling in the past but is not interested in doing so again at this time.  Plan: - Continue Amitriptyline as prescribed - Resume Prozac 10mg  daily for 1 week then increase to 20mg  daily.  Further titration may be needed as previously she was on 40mg  daily - RTC  4/18 with PCP.  Repeat PHQ to help with dosing and to see if she would be interested in counseling.

## 2017-07-31 NOTE — Assessment & Plan Note (Signed)
Her BP is elevated today at 164/88.  She is only taking Bumex 1 time per day instead of BID.  She reports feeling like she is "keeping fluid on" in her hands, legs, and abdomen.  On exam, she has some peripheral edema.  Her weight today is increased 4 pounds from previous.  She was taken off Lisinopril in Nov due to acute renal failure and hyperkalemia during her hospitalization.  Plan: - Recommended taking Bumex 1mg  BID as prescribed - Follow up with PCP scheduled on 4/18 - Repeat BMET then.  Last BMET with stable renal function and potassium - Her microalbumin-creatinine ratio is elevated and she would benefit from ACE/ARB therapy with her hx of DM.  If BP is stable at follow up next month, recommend re-initiating therapy at that time.  Did not do so today in order to avoid doing too much at one visit.

## 2017-07-31 NOTE — Patient Instructions (Signed)
FOLLOW-UP INSTRUCTIONS When: on April 18 with Dr Ronalee Red For: blood pressure and depression What to bring: current medications  Please start Prozac at 10mg  per day for the first week then increase to 20mg  per day after.  Take Bumex 1mg  twice per day instead of once per day.

## 2017-07-31 NOTE — Assessment & Plan Note (Signed)
Her PHQ today is 9.  She reports since amputation in Nov and passing of her mother in Dec that she is feeling overwhelmed, more depressed, and having trouble managing all of these life stressors.  She previously was on Prozac as well as Buspar and then Cymbalta at one time.  She recalls Prozac being effective.  She is also on Amitriptyline which helps her sleep but is not doing much for her mood.  She has been to counseling in the past but is not interested in doing so again at this time.  Plan: - Continue Amitriptyline as prescribed - Resume Prozac 10mg  daily for 1 week then increase to 20mg  daily.  Further titration may be needed as previously she was on 40mg  daily - RTC 4/18 with PCP.  Repeat PHQ to help with dosing and to see if she would be interested in counseling.

## 2017-07-31 NOTE — Progress Notes (Signed)
[facility] Clinic Attending  Case discussed with Dr. Wallace  at the time of the visit.  We reviewed the resident's history and exam and pertinent patient test results.  I agree with the assessment, diagnosis, and plan of care documented in the resident's note.  Alexander N Raines, MD   

## 2017-08-01 DIAGNOSIS — D631 Anemia in chronic kidney disease: Secondary | ICD-10-CM | POA: Diagnosis not present

## 2017-08-01 DIAGNOSIS — E1122 Type 2 diabetes mellitus with diabetic chronic kidney disease: Secondary | ICD-10-CM | POA: Diagnosis not present

## 2017-08-01 DIAGNOSIS — Z4781 Encounter for orthopedic aftercare following surgical amputation: Secondary | ICD-10-CM | POA: Diagnosis not present

## 2017-08-01 DIAGNOSIS — I509 Heart failure, unspecified: Secondary | ICD-10-CM | POA: Diagnosis not present

## 2017-08-01 DIAGNOSIS — N183 Chronic kidney disease, stage 3 (moderate): Secondary | ICD-10-CM | POA: Diagnosis not present

## 2017-08-01 DIAGNOSIS — I130 Hypertensive heart and chronic kidney disease with heart failure and stage 1 through stage 4 chronic kidney disease, or unspecified chronic kidney disease: Secondary | ICD-10-CM | POA: Diagnosis not present

## 2017-08-02 ENCOUNTER — Telehealth (INDEPENDENT_AMBULATORY_CARE_PROVIDER_SITE_OTHER): Payer: Self-pay | Admitting: Orthopedic Surgery

## 2017-08-02 NOTE — Telephone Encounter (Signed)
I called and sw HHN. She advised that pt does not express a danger to herself or others at this point but she is very overwhelmed with social aspects of her life and with the wheelchair dependency. She did have social work go out help the pt with as many things as she could from that stand point. The pt is smoking and not complaint with medication. She did see her PCP this week and will follow up with them next month. Has encouraged grief council through Hospice  And pt declined as she does not want to leave her house. HHN expressed moods are up and down but does not feel that she is in harms way that we should notify authorities to do a well check on the pt. I advised that Dr. Sharol Given is aware and again advised to contact PCP in which [facility] states they are aware and discussed at her appt on 07/31/17. Pt has an appt on 08/17/17 and we will continue to encourage the pt and discuss care in office at that time.

## 2017-08-02 NOTE — Telephone Encounter (Signed)
Stephani Police with [facility] called to let Dr. Sharol Given know she discharged patient yesterday. She has been non compliant in all areas, continues to smoke, depressed due to moms death, and has many issues with her stump due to her "giving up". She states patient will do her own wound care and that they set her up with pad supplies.

## 2017-08-02 NOTE — Telephone Encounter (Signed)
FYI HHN alexis calls and states pt has been discharged from [facility] now but she does have concerns: Pt is not being compliant with meds and care She talks often about "im ready to go be with my moma" HHN is concerned with BP and stump care

## 2017-08-07 ENCOUNTER — Other Ambulatory Visit: Payer: Self-pay | Admitting: Physical Medicine & Rehabilitation

## 2017-08-11 ENCOUNTER — Encounter (HOSPITAL_COMMUNITY): Payer: Self-pay | Admitting: *Deleted

## 2017-08-11 ENCOUNTER — Other Ambulatory Visit: Payer: Self-pay

## 2017-08-11 ENCOUNTER — Emergency Department (HOSPITAL_COMMUNITY)
Admission: EM | Admit: 2017-08-11 | Discharge: 2017-08-11 | Disposition: A | Discharge status: Home / Self Care | Payer: Medicare Other | Attending: Emergency Medicine | Admitting: Emergency Medicine

## 2017-08-11 DIAGNOSIS — I129 Hypertensive chronic kidney disease with stage 1 through stage 4 chronic kidney disease, or unspecified chronic kidney disease: Secondary | ICD-10-CM | POA: Insufficient documentation

## 2017-08-11 DIAGNOSIS — Y829 Unspecified medical devices associated with adverse incidents: Secondary | ICD-10-CM | POA: Insufficient documentation

## 2017-08-11 DIAGNOSIS — E1122 Type 2 diabetes mellitus with diabetic chronic kidney disease: Secondary | ICD-10-CM | POA: Diagnosis not present

## 2017-08-11 DIAGNOSIS — T148XXA Other injury of unspecified body region, initial encounter: Secondary | ICD-10-CM

## 2017-08-11 DIAGNOSIS — Z794 Long term (current) use of insulin: Secondary | ICD-10-CM | POA: Diagnosis not present

## 2017-08-11 DIAGNOSIS — E039 Hypothyroidism, unspecified: Secondary | ICD-10-CM | POA: Insufficient documentation

## 2017-08-11 DIAGNOSIS — F17210 Nicotine dependence, cigarettes, uncomplicated: Secondary | ICD-10-CM | POA: Diagnosis not present

## 2017-08-11 DIAGNOSIS — T8149XA Infection following a procedure, other surgical site, initial encounter: Secondary | ICD-10-CM | POA: Diagnosis not present

## 2017-08-11 DIAGNOSIS — L089 Local infection of the skin and subcutaneous tissue, unspecified: Secondary | ICD-10-CM

## 2017-08-11 DIAGNOSIS — Z79899 Other long term (current) drug therapy: Secondary | ICD-10-CM | POA: Insufficient documentation

## 2017-08-11 DIAGNOSIS — N183 Chronic kidney disease, stage 3 (moderate): Secondary | ICD-10-CM | POA: Insufficient documentation

## 2017-08-11 DIAGNOSIS — M96830 Postprocedural hemorrhage and hematoma of a musculoskeletal structure following a musculoskeletal system procedure: Secondary | ICD-10-CM | POA: Diagnosis present

## 2017-08-11 DIAGNOSIS — F319 Bipolar disorder, unspecified: Secondary | ICD-10-CM | POA: Diagnosis not present

## 2017-08-11 DIAGNOSIS — F419 Anxiety disorder, unspecified: Secondary | ICD-10-CM | POA: Insufficient documentation

## 2017-08-11 DIAGNOSIS — R05 Cough: Secondary | ICD-10-CM | POA: Diagnosis not present

## 2017-08-11 LAB — COMPREHENSIVE METABOLIC PANEL
ALBUMIN: 1.9 g/dL — AB (ref 3.5–5.0)
ALK PHOS: 127 U/L — AB (ref 38–126)
ALT: 21 U/L (ref 14–54)
AST: 25 U/L (ref 15–41)
Anion gap: 8 (ref 5–15)
BILIRUBIN TOTAL: 0.5 mg/dL (ref 0.3–1.2)
BUN: 20 mg/dL (ref 6–20)
CALCIUM: 8.3 mg/dL — AB (ref 8.9–10.3)
CO2: 27 mmol/L (ref 22–32)
Chloride: 99 mmol/L — ABNORMAL LOW (ref 101–111)
Creatinine, Ser: 1.42 mg/dL — ABNORMAL HIGH (ref 0.44–1.00)
GFR calc Af Amer: 52 mL/min — ABNORMAL LOW (ref >60)
GFR, EST NON AFRICAN AMERICAN: 45 mL/min — AB (ref >60)
GLUCOSE: 124 mg/dL — AB (ref 65–99)
POTASSIUM: 4.3 mmol/L (ref 3.5–5.1)
Sodium: 134 mmol/L — ABNORMAL LOW (ref 135–145)
Total Protein: 7.3 g/dL (ref 6.5–8.1)

## 2017-08-11 LAB — URINALYSIS, ROUTINE W REFLEX MICROSCOPIC
Bilirubin Urine: NEGATIVE
Glucose, UA: 50 mg/dL — AB
Ketones, ur: NEGATIVE mg/dL
NITRITE: NEGATIVE
PH: 5 (ref 5.0–8.0)
Protein, ur: >=300 mg/dL — AB
SPECIFIC GRAVITY, URINE: 1.022 (ref 1.005–1.030)

## 2017-08-11 LAB — CBC WITH DIFFERENTIAL/PLATELET
BASOS ABS: 0 10*3/uL (ref 0.0–0.1)
BASOS PCT: 0 %
EOS ABS: 0.2 10*3/uL (ref 0.0–0.7)
EOS PCT: 2 %
HCT: 25.9 % — ABNORMAL LOW (ref 36.0–46.0)
Hemoglobin: 7.9 g/dL — ABNORMAL LOW (ref 12.0–15.0)
LYMPHS PCT: 16 %
Lymphs Abs: 1.3 10*3/uL (ref 0.7–4.0)
MCH: 22.5 pg — ABNORMAL LOW (ref 26.0–34.0)
MCHC: 30.5 g/dL (ref 30.0–36.0)
MCV: 73.8 fL — ABNORMAL LOW (ref 78.0–100.0)
MONO ABS: 0.8 10*3/uL (ref 0.1–1.0)
Monocytes Relative: 10 %
Neutro Abs: 5.8 10*3/uL (ref 1.7–7.7)
Neutrophils Relative %: 72 %
PLATELETS: 324 10*3/uL (ref 150–400)
RBC: 3.51 MIL/uL — AB (ref 3.87–5.11)
RDW: 15.8 % — AB (ref 11.5–15.5)
WBC: 8.2 10*3/uL (ref 4.0–10.5)

## 2017-08-11 LAB — I-STAT CG4 LACTIC ACID, ED
LACTIC ACID, VENOUS: 0.93 mmol/L (ref 0.5–1.9)
LACTIC ACID, VENOUS: 1.09 mmol/L (ref 0.5–1.9)

## 2017-08-11 LAB — I-STAT BETA HCG BLOOD, ED (MC, WL, AP ONLY)

## 2017-08-11 LAB — CBG MONITORING, ED: GLUCOSE-CAPILLARY: 141 mg/dL — AB (ref 65–99)

## 2017-08-11 MED ORDER — OXYCODONE-ACETAMINOPHEN 5-325 MG PO TABS
1.0000 | ORAL_TABLET | Freq: Once | ORAL | Status: AC
  Administered 2017-08-11: 1 via ORAL
  Filled 2017-08-11: qty 1

## 2017-08-11 MED ORDER — DAKINS (1/4 STRENGTH) 0.125 % EX SOLN
Freq: Once | CUTANEOUS | Status: AC
  Administered 2017-08-11: 21:00:00
  Filled 2017-08-11: qty 473

## 2017-08-11 MED ORDER — DOXYCYCLINE HYCLATE 100 MG PO TABS
100.0000 mg | ORAL_TABLET | Freq: Once | ORAL | Status: AC
  Administered 2017-08-11: 100 mg via ORAL
  Filled 2017-08-11: qty 1

## 2017-08-11 MED ORDER — HYDROCODONE-ACETAMINOPHEN 5-325 MG PO TABS: 1.0000 | ORAL_TABLET | Freq: Four times a day (QID) | ORAL | 0 refills | Status: DC | PRN

## 2017-08-11 MED ORDER — LEVOFLOXACIN 500 MG PO TABS: 500.0000 mg | ORAL_TABLET | Freq: Once | ORAL | Status: DC

## 2017-08-11 MED ORDER — DOXYCYCLINE HYCLATE 100 MG PO CAPS: 100.0000 mg | ORAL_CAPSULE | Freq: Two times a day (BID) | ORAL | 0 refills | Status: DC

## 2017-08-11 NOTE — ED Provider Notes (Signed)
[facility] EMERGENCY DEPARTMENT Provider Note   CSN: [id number] Arrival date & time: 08/11/17  1637     History   Chief Complaint Chief Complaint  Patient presents with  . Fatigue  . Wound Infection    HPI [name] [name] is a 42 y.o. female.  Patient states that she is having drainage from her wound on her below the knee amputation on the left side.  Patient also complains of a cough and congestion  The history is provided by the patient. No language interpreter was used.  Illness  This is a recurrent problem. The current episode started more than 2 days ago. The problem occurs constantly. The problem has not changed since onset.Pertinent negatives include no chest pain, no abdominal pain and no headaches. Nothing aggravates the symptoms. Nothing relieves the symptoms. She has tried nothing for the symptoms. The treatment provided no relief.    Past Medical History:  Diagnosis Date  . Abnormal Pap smear of cervix 2009  . Abscess    history of multiple abscesses  . Anemia of chronic disease 2002  . Anxiety    Panic attacks  . Bipolar disorder ([place])   . Cellulitis 05/21/2014   right eye  . Chronic bronchitis ([place])    "get it q yr" (05/13/2013)  . Chronic pain   . Depression   . Edema of lower extremity   . Endocarditis 2002   subacute bacterial endocarditis.   . Family history of anesthesia complication    "my mom has a hard time coming out from under"  . Fibromyalgia   . GERD (gastroesophageal reflux disease)    occ  . Heart murmur   . History of blood transfusion    "just low blood count" (05/13/2013)  . Hyperlipidemia   . Hypertension   . Hypothyroidism   . Hypothyroidism, adult 03/21/2014  . Necrosis ([place])    and ulceration  . Obesity   . OSA on CPAP    does not wear CPAP  . Peripheral neuropathy   . Type II diabetes mellitus ([place])    Type  II    Patient Active Problem List   Diagnosis Date Noted  . Dehiscence of  amputation stump ([place])   . Fall   . Stage 3 chronic kidney disease ([place])   . Peripheral edema   . Poorly controlled type 2 diabetes mellitus with peripheral neuropathy ([place])   . Status post below knee amputation, left ([place]) 04/11/2017  . Post-operative pain   . Bipolar affective disorder ([place])   . Type 2 diabetes mellitus with peripheral neuropathy ([facility])   . OSA (obstructive sleep apnea)   . Super obese   . Leukocytosis   . Acute blood loss anemia   . Adjustment disorder with depressed mood 03/26/2017  . Severe sepsis ([place]) 03/24/2017  . Abnormal ECG 03/24/2017  . Acute bilateral low back pain 02/13/2017  . Heart failure with preserved ejection fraction ([place]) 02/09/2017  . Jerking movements of extremities 12/08/2016  . Routine adult health maintenance 12/08/2016  . Chronic pain of right knee 09/08/2016  . Abdominal muscle pain 09/08/2016  . Vertigo 08/05/2016  . Lightheadedness 07/15/2016  . CKD (chronic kidney disease) stage 2, GFR 60-89 ml/min 07/15/2016  . Bilateral lower extremity edema 05/13/2016  . OSA on CPAP 05/13/2016  . Vaginal pruritus 03/14/2016  . Morbid obesity due to excess calories ([place]) 11/27/2015  . Callus of foot 11/26/2015  . History of Low serum cortisol level ([place]) 01/07/2015  .  Financial difficulty 04/25/2013  . Uncontrolled type 2 diabetes mellitus ([place]) 07/12/2012  . Necrotizing fasciitis s/p OR debridements 07/06/2012  . Carpal tunnel syndrome, bilateral 01/25/2012  . Diabetic peripheral neuropathy ([place]) 07/04/2011  . Anxiety and depression 06/02/2011  . History of abnormal cervical Pap smear 12/13/2007  . GERD 12/06/2007  . Hyperlipidemia 08/29/2007  . Hypertension associated with diabetes ([place]) 08/29/2007    Past Surgical History:  Procedure Laterality Date  . AMPUTATION Left 04/07/2017   Procedure: LEFT BELOW KNEE AMPUTATION;  Surgeon: Newt Minion, MD;  Location: [facility];  Service: Orthopedics;  Laterality: Left;  . EYE SURGERY     [facility]    . INCISION AND DRAINAGE ABSCESS     multiple I&Ds  . INCISION AND DRAINAGE ABSCESS Left 07/09/2012   Procedure: DRESSING CHANGE, THIGH WOUND;  Surgeon: Harl Bowie, MD;  Location: [facility];  Service: General;  Laterality: Left;  . INCISION AND DRAINAGE OF WOUND Left 07/07/2012   Procedure: IRRIGATION AND DEBRIDEMENT WOUND;  Surgeon: Harl Bowie, MD;  Location: [facility];  Service: General;  Laterality: Left;  . INCISION AND DRAINAGE PERIRECTAL ABSCESS Left 07/14/2012   Procedure: DEBRIDEMENT OF SKIN & SOFT TISSUE; DRESSING CHANGE UNDER ANESTHESIA;  Surgeon: Gayland Curry, MD,FACS;  Location: [facility];  Service: General;  Laterality: Left;  . INCISION AND DRAINAGE PERIRECTAL ABSCESS Left 07/16/2012   Procedure: I&D Left Thigh;  Surgeon: Gwenyth Ober, MD;  Location: [facility];  Service: General;  Laterality: Left;  . INCISION AND DRAINAGE PERIRECTAL ABSCESS N/A 01/05/2015   Procedure: IRRIGATION AND DEBRIDEMENT PERIRECTAL ABSCESS;  Surgeon: Donnie Mesa, MD;  Location: [facility];  Service: General;  Laterality: N/A;  . IRRIGATION AND DEBRIDEMENT ABSCESS Left 07/06/2012   Procedure: IRRIGATION AND DEBRIDEMENT ABSCESS BUTTOCKS AND THIGH;  Surgeon: Shann Medal, MD;  Location: [facility];  Service: General;  Laterality: Left;  . IRRIGATION AND DEBRIDEMENT ABSCESS Left 08/10/2012   Procedure: IRRIGATION AND DEBRIDEMENT ABSCESS;  Surgeon: Madilyn Hook, DO;  Location: [facility];  Service: General;  Laterality: Left;  . STUMP REVISION Left 06/09/2017   Procedure: REVISION LEFT BELOW KNEE AMPUTATION;  Surgeon: Newt Minion, MD;  Location: [facility];  Service: Orthopedics;  Laterality: Left;  . TONSILLECTOMY  1994     OB History   None      Home Medications    Prior to Admission medications   Medication Sig Start Date End Date Taking? Authorizing Provider  amitriptyline (ELAVIL) 50 MG tablet Take 1 tablet (50 mg total) by mouth at bedtime. 04/25/17   Angiulli, Lavon Paganini, PA-C  Ascorbic Acid (VITAMIN C) 1000 MG  tablet Take 1,000 mg by mouth daily.    [provider]  atorvastatin (LIPITOR) 20 MG tablet Take 1 tablet (20 mg total) by mouth daily. 04/25/17   Angiulli, Lavon Paganini, PA-C  bumetanide (BUMEX) 1 MG tablet Take 1 tablet (1 mg total) by mouth 2 (two) times daily. 04/25/17   Angiulli, Lavon Paganini, PA-C  cephALEXin (KEFLEX) 500 MG capsule Take 1 capsule (500 mg total) by mouth 3 (three) times daily. 06/15/17   Newt Minion, MD  cyclobenzaprine (FLEXERIL) 10 MG tablet Take 1 tablet (10 mg total) by mouth every 12 (twelve) hours as needed for muscle spasms. 07/22/17   Colbert Ewing, MD  docusate sodium (COLACE) 100 MG capsule Take 1 capsule (100 mg total) by mouth 2 (two) times daily. Patient taking differently: Take 100 mg by mouth 2 (two) times daily as  needed for moderate constipation.  04/25/17   Angiulli, Lavon Paganini, PA-C  doxycycline (VIBRAMYCIN) 100 MG capsule Take 1 capsule (100 mg total) by mouth 2 (two) times daily. One po bid x 7 days 08/11/17   Milton Ferguson, MD  FLUoxetine (PROZAC) 10 MG capsule Take 1 capsule (10mg  total) by mouth daily for 1 week, THEN 2 capsules (20mg  total) daily. 07/31/17   Jule Ser, DO  HYDROcodone-acetaminophen (NORCO/VICODIN) 5-325 MG tablet Take 1 tablet by mouth every 6 (six) hours as needed for moderate pain. 08/11/17   Milton Ferguson, MD  insulin aspart (NOVOLOG FLEXPEN) 100 UNIT/ML FlexPen Inject 6 Units into the skin 3 (three) times daily with meals. Patient taking differently: Inject 14 Units into the skin 3 (three) times daily with meals.  04/25/17   Angiulli, Lavon Paganini, PA-C  insulin glargine (LANTUS) 100 UNIT/ML injection Inject 35 units below the skin at bedtime 07/24/17   Colbert Ewing, MD  insulin glargine (LANTUS) 100 unit/mL SOPN Inject 0.35 mLs (35 Units total) into the skin at bedtime. Patient taking differently: Inject 40 Units into the skin 2 (two) times daily.  04/25/17   Angiulli, Lavon Paganini, PA-C  iron polysaccharides (NIFEREX) 150 MG capsule  Take 1 capsule (150 mg total) by mouth 2 (two) times daily before lunch and supper. Patient taking differently: Take 150 mg by mouth daily.  04/25/17   Angiulli, Lavon Paganini, PA-C  liraglutide (VICTOZA) 18 MG/3ML SOPN Inject 0.3 mLs (1.8 mg total) into the skin daily. 07/22/17   Colbert Ewing, MD  methocarbamol (ROBAXIN) 500 MG tablet Take 1 tablet (500 mg total) by mouth every 6 (six) hours as needed for muscle spasms. 04/25/17   Angiulli, Lavon Paganini, PA-C  Multiple Vitamins-Calcium (ONE-A-DAY WOMENS PO) Take 1 tablet by mouth daily.    [provider]  mupirocin ointment (BACTROBAN) 2 % Apply 1 application topically 2 (two) times daily. 06/28/17   Suzan Slick, NP  oxyCODONE-acetaminophen (PERCOCET/ROXICET) 5-325 MG tablet Take 1 tablet by mouth every 8 (eight) hours as needed for severe pain. 07/17/17   Newt Minion, MD  oxyCODONE-acetaminophen (PERCOCET/ROXICET) 5-325 MG tablet Take 1 tablet by mouth every 8 (eight) hours as needed for severe pain. 07/27/17   Newt Minion, MD  polyethylene glycol [facility] / Floria Raveling) packet Take 17 g by mouth daily. Patient taking differently: Take 17 g by mouth daily as needed for moderate constipation.  04/25/17   Angiulli, Lavon Paganini, PA-C  pregabalin (LYRICA) 100 MG capsule Take 1 capsule (100 mg total) by mouth 3 (three) times daily. 04/25/17   Angiulli, Lavon Paganini, PA-C  sulfamethoxazole-trimethoprim (BACTRIM DS,SEPTRA DS) 800-160 MG tablet Take 1 tablet by mouth 2 (two) times daily. 06/28/17   Suzan Slick, NP  VOLTAREN 1 % GEL Apply 4 grams to affected area (topically) 4 times daily Patient taking differently: Apply 4 grams to affected area (topically) 4 times daily as needed for pain 03/07/17   Bartholomew Crews, MD    Family History Family History  Problem Relation Age of Onset  . Heart failure Mother   . Diabetes Mother   . Kidney disease Mother   . Kidney disease Father   . Diabetes Father   . Diabetes Paternal Grandmother   . Heart failure  Paternal Grandmother   . Other Other     Social History Social History   Tobacco Use  . Smoking status: Current Every Day Smoker    Packs/day: 0.50    Years: 15.00  Pack years: 7.50    Types: Cigarettes  . Smokeless tobacco: Never Used  . Tobacco comment: Cutting back.   Substance Use Topics  . Alcohol use: Yes    Alcohol/week: 0.0 oz    Comment: Socially-" monthy  maybe"  . Drug use: No     Allergies   Vancomycin   Review of Systems Review of Systems  Constitutional: Negative for appetite change and fatigue.  HENT: Negative for congestion, ear discharge and sinus pressure.   Eyes: Negative for discharge.  Respiratory: Positive for cough.   Cardiovascular: Negative for chest pain.  Gastrointestinal: Negative for abdominal pain and diarrhea.  Genitourinary: Negative for frequency and hematuria.  Musculoskeletal: Negative for back pain.  Skin: Negative for rash.       Drainage from the below the knee amputation incision.  Neurological: Negative for seizures and headaches.  Psychiatric/Behavioral: Negative for hallucinations.     Physical Exam Updated Vital Signs BP 130/84 (BP Location: Right Arm)   Pulse (!) 113   Temp 98.3 F (36.8 C) (Oral)   Resp 16   SpO2 100%   Physical Exam  Constitutional: She is oriented to person, place, and time. She appears well-developed.  HENT:  Head: Normocephalic.  Eyes: Conjunctivae and EOM are normal. No scleral icterus.  Neck: Neck supple. No thyromegaly present.  Cardiovascular: Normal rate and regular rhythm. Exam reveals no gallop and no friction rub.  No murmur heard. Pulmonary/Chest: No stridor. She has no wheezes. She has no rales. She exhibits no tenderness.  Abdominal: She exhibits no distension. There is no tenderness. There is no rebound.  Musculoskeletal: Normal range of motion. She exhibits no edema.  Patient has a below the knee amputation.  On the left side.  The incision is draining purulent fluid.    Lymphadenopathy:    She has no cervical adenopathy.  Neurological: She is oriented to person, place, and time. She exhibits normal muscle tone. Coordination normal.  Skin: No rash noted. No erythema.  Psychiatric: She has a normal mood and affect. Her behavior is normal.     ED Treatments / Results  Labs (all labs ordered are listed, but only abnormal results are displayed) Labs Reviewed  COMPREHENSIVE METABOLIC PANEL - Abnormal; Notable for the following components:      Result Value   Sodium 134 (*)    Chloride 99 (*)    Glucose, Bld 124 (*)    Creatinine, Ser 1.42 (*)    Calcium 8.3 (*)    Albumin 1.9 (*)    Alkaline Phosphatase 127 (*)    GFR calc non Af Amer 45 (*)    GFR calc Af Amer 52 (*)    All other components within normal limits  CBC WITH DIFFERENTIAL/PLATELET - Abnormal; Notable for the following components:   RBC 3.51 (*)    Hemoglobin 7.9 (*)    HCT 25.9 (*)    MCV 73.8 (*)    MCH 22.5 (*)    RDW 15.8 (*)    All other components within normal limits  URINALYSIS, ROUTINE W REFLEX MICROSCOPIC - Abnormal; Notable for the following components:   Color, Urine AMBER (*)    APPearance CLOUDY (*)    Glucose, UA 50 (*)    Hgb urine dipstick SMALL (*)    Protein, ur >=300 (*)    Leukocytes, UA MODERATE (*)    Bacteria, UA FEW (*)    Squamous Epithelial / LPF 6-30 (*)    All other components within  normal limits  CBG MONITORING, ED - Abnormal; Notable for the following components:   Glucose-Capillary 141 (*)    All other components within normal limits  AEROBIC CULTURE (SUPERFICIAL SPECIMEN)  I-STAT CG4 LACTIC ACID, ED  I-STAT BETA HCG BLOOD, ED ([facility], [facility], [facility] ONLY)  I-STAT CG4 LACTIC ACID, ED    EKG None  Radiology No results found.  Procedures Procedures (including critical care time)  Medications Ordered in ED Medications  oxyCODONE-acetaminophen (PERCOCET/ROXICET) 5-325 MG per tablet 1 tablet (has no administration in time range)  sodium  hypochlorite (DAKIN'S 1/4 STRENGTH) topical solution (has no administration in time range)  doxycycline (VIBRA-TABS) tablet 100 mg (has no administration in time range)     Initial Impression / Assessment and Plan / ED Course  I have reviewed the triage vital signs and the nursing notes.  Pertinent labs & imaging results that were available during my care of the patient were reviewed by me and considered in my medical decision making (see chart for details).     Patient with an upper respiratory infection and infection in her incision from her below the knee amputation.  This infection is draining purulent material which has been cultured.  Patient's lab work shows her to be anemic which has been stable and normal white blood count.  Patient will be placed on doxycycline to help cover infection with the stump and her respiratory infection.  She has been instructed to call her surgeon on Monday and let them know what is happening.  Also tell them that she had a culture today Final Clinical Impressions(s) / ED Diagnoses   Final diagnoses:  Wound infection    ED Discharge Orders        Ordered    HYDROcodone-acetaminophen (NORCO/VICODIN) 5-325 MG tablet  Every 6 hours PRN     08/11/17 2121    doxycycline (VIBRAMYCIN) 100 MG capsule  2 times daily     08/11/17 2121       Milton Ferguson, MD 08/11/17 2126

## 2017-08-11 NOTE — Discharge Instructions (Signed)
Call Dr. due to his office on Monday and let them know that she had a wound infection that was draining and that we did a culture on it.  Let them know how you are doing.  They may want to see you sooner than Thursday

## 2017-08-11 NOTE — ED Notes (Signed)
Pt discharged from ED; instructions provided and scripts given; Pt encouraged to return to ED if symptoms worsen and to f/u with PCP; Pt verbalized understanding of all instructions 

## 2017-08-11 NOTE — ED Triage Notes (Addendum)
Pt reports fatigue, bodyaches, congestion for several days. States cbgs have been running high > 300. Has surgical wound to left leg that she has been monitoring and changing dressing 2xday. States she noticed an abscess formed on her leg yesterday and has started draining. Has dressing on pta. Denies any redness or streaks to her leg. Has been taking her antibiotics as prescribed.

## 2017-08-13 LAB — AEROBIC CULTURE  (SUPERFICIAL SPECIMEN)

## 2017-08-13 LAB — AEROBIC CULTURE W GRAM STAIN (SUPERFICIAL SPECIMEN)

## 2017-08-14 ENCOUNTER — Telehealth: Payer: Self-pay | Admitting: *Deleted

## 2017-08-14 NOTE — Progress Notes (Signed)
ED Antimicrobial Stewardship Positive Culture Follow Up   [name] [name] is an 42 y.o. female who presented to [facility] on 08/11/2017 with a chief complaint of  Chief Complaint  Patient presents with  . Fatigue  . Wound Infection    Recent Results (from the past 720 hour(s))  Wound or Superficial Culture     Status: None   Collection Time: 08/11/17  9:11 PM  Result Value Ref Range Status   Specimen Description WOUND  Final   Special Requests ABSCESS  Final   Gram Stain   Final    MODERATE WBC PRESENT, PREDOMINANTLY PMN FEW GRAM POSITIVE COCCI    Culture   Final    ABUNDANT GROUP B STREP(S.AGALACTIAE)ISOLATED TESTING AGAINST S. AGALACTIAE NOT ROUTINELY PERFORMED DUE TO PREDICTABILITY OF AMP/PEN/VAN SUSCEPTIBILITY. Performed at [facility], [place] [address], [place], [place] [address]    Report Status 08/13/2017 FINAL  Final    [x]  Needs additional follow-up  39 YOF with L-BKA in November '18 with revision in Jan '19 who presented on 3/22 with drainage from the BKA stump site. Also with URI - the patient was given Doxy to treat both.  The cultures have now grown Group B Strep - not covered well by Doxycycline. Will add Keflex to cover this. The culture should be faxed to the patient's ortho MD:  [place] Fax [phone]  The patient should get an appointment and follow-up with ortho this week.  New antibiotic prescription: Keflex po 500 mg four times daily for 10 days  ED Provider: Rodell Perna, PA-C  Lawson Radar 08/14/2017, 8:30 AM Infectious Diseases Pharmacist Phone# [phone]

## 2017-08-14 NOTE — Telephone Encounter (Signed)
Post ED Visit - Positive Culture Follow-up: Successful Patient Follow-Up  Culture assessed and recommendations reviewed by: []  Elenor Quinones, Pharm.D. []  Heide Guile, Pharm.D., BCPS AQ-ID []  Parks Neptune, Pharm.D., BCPS [x]  Alycia Rossetti, Pharm.D., BCPS []  [place], [place].D., BCPS, AAHIVP []  Legrand Como, Pharm.D., BCPS, AAHIVP []  Salome Arnt, PharmD, BCPS []  Dimitri Ped, PharmD, BCPS []  Vincenza Hews, PharmD, BCPS  Positive wound culture  []  Patient discharged without antimicrobial prescription and treatment is now indicated []  Organism is resistant to prescribed ED discharge antimicrobial []  Patient with positive blood cultures Additional antibiotic treatment provided Changes discussed with ED provider: Rodell Perna, PA-C New antibiotic prescription Keflex 500mg  QID x 10 days Called to [facility], [phone] Faxed to Dr. Sharol Given, has appt for 08/17/2017  Contacted patient, date 08/14/2017, time 0940   Ardeen Fillers 08/14/2017, 9:38 AM

## 2017-08-17 ENCOUNTER — Ambulatory Visit (INDEPENDENT_AMBULATORY_CARE_PROVIDER_SITE_OTHER): Payer: Medicare Other | Admitting: Orthopedic Surgery

## 2017-08-17 ENCOUNTER — Encounter (INDEPENDENT_AMBULATORY_CARE_PROVIDER_SITE_OTHER): Payer: Self-pay | Admitting: Orthopedic Surgery

## 2017-08-17 VITALS — Ht 70.0 in | Wt 328.0 lb

## 2017-08-17 DIAGNOSIS — T8781 Dehiscence of amputation stump: Secondary | ICD-10-CM

## 2017-08-17 MED ORDER — OXYCODONE-ACETAMINOPHEN 5-325 MG PO TABS: 1.0000 | ORAL_TABLET | Freq: Three times a day (TID) | ORAL | 0 refills | Status: DC | PRN

## 2017-08-17 NOTE — Progress Notes (Signed)
Office Visit Note   Patient: [name] [name]           Date of Birth: 1975-06-13           MRN: [id number] Visit Date: 08/17/2017              Requested by: Colbert Ewing, MD [address] [place], [place] [address] PCP: Colbert Ewing, MD  Chief Complaint  Patient presents with  . Left Leg - Open Wound, Follow-up    06/09/17 left BKA revision      HPI: Patient is a 42 year old woman status post revision transtibial amputation of the left approximately 2 months ago.  Patient has been having clear drainage and a wound that is about 1 cm diameter 1 cm deep with good granulation tissue.  Patient has had cultures which were positive for group B strep patient was prescribed doxycycline and Keflex but she states she has not filled the prescription yet.  Assessment & Plan: Visit Diagnoses:  1. Dehiscence of amputation stump ([place])     Plan: Patient will pack the wound open with gauze change this daily.  Continue with compression continue with elevation she will take her antibiotics.  Follow-Up Instructions: Return in about 1 week (around 08/24/2017).   Ortho Exam  Patient is alert, oriented, no adenopathy, well-dressed, normal affect, normal respiratory effort. Examination patient has clear drainage from the skin of the left transtibial amputation secondary to swelling.  She has a wound with no purulent drainage no odor the wound is 1 cm in diameter 1 cm deep and has healthy granulation tissue.  Imaging: No results found. No images are attached to the encounter.  Labs: Lab Results  Component Value Date   HGBA1C 6.5 (H) 06/09/2017   HGBA1C 9.0 (H) 03/25/2017   HGBA1C 11.3 (H) 11/21/2016   ESRSEDRATE 130 (H) 03/24/2017   ESRSEDRATE 53 (H) 09/14/2015   ESRSEDRATE 132 (H) 07/06/2012   CRP 5.4 (H) 09/14/2015   CRP 23.0 (H) 07/06/2012   REPTSTATUS 08/13/2017 FINAL 08/11/2017   GRAMSTAIN  08/11/2017    MODERATE WBC PRESENT, PREDOMINANTLY PMN FEW GRAM POSITIVE COCCI    CULT   08/11/2017    ABUNDANT GROUP B STREP(S.AGALACTIAE)ISOLATED TESTING AGAINST S. AGALACTIAE NOT ROUTINELY PERFORMED DUE TO PREDICTABILITY OF AMP/PEN/VAN SUSCEPTIBILITY. Performed at [facility], [place] [address], [place], [place] [address]    LABORGA ENTEROCOCCUS FAECALIS 06/09/2017    @LABSALLVALUES (HGBA1)@  Body mass index is 47.06 kg/m.  Orders:  No orders of the defined types were placed in this encounter.  Meds ordered this encounter  Medications  . oxyCODONE-acetaminophen (PERCOCET/ROXICET) 5-325 MG tablet    Sig: Take 1 tablet by mouth every 8 (eight) hours as needed for severe pain.    Dispense:  30 tablet    Refill:  0     Procedures: No procedures performed  Clinical Data: No additional findings.  ROS:  All other systems negative, except as noted in the HPI. Review of Systems  Objective: Vital Signs: Ht 5\' 10"  (1.778 m)   Wt (!) 328 lb (148.8 kg)   BMI 47.06 kg/m   Specialty Comments:  No specialty comments available.  PMFS History: Patient Active Problem List   Diagnosis Date Noted  . Dehiscence of amputation stump ([place])   . Fall   . Stage 3 chronic kidney disease ([place])   . Peripheral edema   . Poorly controlled type 2 diabetes mellitus with peripheral neuropathy ([place])   . Status post below knee  amputation, left ([place]) 04/11/2017  . Post-operative pain   . Bipolar affective disorder ([place])   . Type 2 diabetes mellitus with peripheral neuropathy (HCC)   . OSA (obstructive sleep apnea)   . Super obese   . Leukocytosis   . Acute blood loss anemia   . Adjustment disorder with depressed mood 03/26/2017  . Severe sepsis ([place]) 03/24/2017  . Abnormal ECG 03/24/2017  . Acute bilateral low back pain 02/13/2017  . Heart failure with preserved ejection fraction ([place]) 02/09/2017  . Jerking movements of extremities 12/08/2016  . Routine adult health maintenance 12/08/2016  . Chronic pain of right knee 09/08/2016  . Abdominal muscle pain 09/08/2016  .  Vertigo 08/05/2016  . Lightheadedness 07/15/2016  . CKD (chronic kidney disease) stage 2, GFR 60-89 ml/min 07/15/2016  . Bilateral lower extremity edema 05/13/2016  . OSA on CPAP 05/13/2016  . Vaginal pruritus 03/14/2016  . Morbid obesity due to excess calories ([place]) 11/27/2015  . Callus of foot 11/26/2015  . History of Low serum cortisol level ([place]) 01/07/2015  . Financial difficulty 04/25/2013  . Uncontrolled type 2 diabetes mellitus ([place]) 07/12/2012  . Necrotizing fasciitis s/p OR debridements 07/06/2012  . Carpal tunnel syndrome, bilateral 01/25/2012  . Diabetic peripheral neuropathy ([place]) 07/04/2011  . Anxiety and depression 06/02/2011  . History of abnormal cervical Pap smear 12/13/2007  . GERD 12/06/2007  . Hyperlipidemia 08/29/2007  . Hypertension associated with diabetes ([place]) 08/29/2007   Past Medical History:  Diagnosis Date  . Abnormal Pap smear of cervix 2009  . Abscess    history of multiple abscesses  . Anemia of chronic disease 2002  . Anxiety    Panic attacks  . Bipolar disorder ([place])   . Cellulitis 05/21/2014   right eye  . Chronic bronchitis ([place])    "get it q yr" (05/13/2013)  . Chronic pain   . Depression   . Edema of lower extremity   . Endocarditis 2002   subacute bacterial endocarditis.   . Family history of anesthesia complication    "my mom has a hard time coming out from under"  . Fibromyalgia   . GERD (gastroesophageal reflux disease)    occ  . Heart murmur   . History of blood transfusion    "just low blood count" (05/13/2013)  . Hyperlipidemia   . Hypertension   . Hypothyroidism   . Hypothyroidism, adult 03/21/2014  . Necrosis ([place])    and ulceration  . Obesity   . OSA on CPAP    does not wear CPAP  . Peripheral neuropathy   . Type II diabetes mellitus (HCC)    Type  II    Family History  Problem Relation Age of Onset  . Heart failure Mother   . Diabetes Mother   . Kidney disease Mother   . Kidney disease Father   .  Diabetes Father   . Diabetes Paternal Grandmother   . Heart failure Paternal Grandmother   . Other Other     Past Surgical History:  Procedure Laterality Date  . AMPUTATION Left 04/07/2017   Procedure: LEFT BELOW KNEE AMPUTATION;  Surgeon: Newt Minion, MD;  Location: [facility];  Service: Orthopedics;  Laterality: Left;  . EYE SURGERY     lazer  . INCISION AND DRAINAGE ABSCESS     multiple I&Ds  . INCISION AND DRAINAGE ABSCESS Left 07/09/2012   Procedure: DRESSING CHANGE, THIGH WOUND;  Surgeon: Harl Bowie, MD;  Location: [facility];  Service: General;  Laterality: Left;  . INCISION AND DRAINAGE OF WOUND Left 07/07/2012   Procedure: IRRIGATION AND DEBRIDEMENT WOUND;  Surgeon: Harl Bowie, MD;  Location: [facility];  Service: General;  Laterality: Left;  . INCISION AND DRAINAGE PERIRECTAL ABSCESS Left 07/14/2012   Procedure: DEBRIDEMENT OF SKIN & SOFT TISSUE; DRESSING CHANGE UNDER ANESTHESIA;  Surgeon: Gayland Curry, MD,FACS;  Location: [facility];  Service: General;  Laterality: Left;  . INCISION AND DRAINAGE PERIRECTAL ABSCESS Left 07/16/2012   Procedure: I&D Left Thigh;  Surgeon: Gwenyth Ober, MD;  Location: [facility];  Service: General;  Laterality: Left;  . INCISION AND DRAINAGE PERIRECTAL ABSCESS N/A 01/05/2015   Procedure: IRRIGATION AND DEBRIDEMENT PERIRECTAL ABSCESS;  Surgeon: Donnie Mesa, MD;  Location: [facility];  Service: General;  Laterality: N/A;  . IRRIGATION AND DEBRIDEMENT ABSCESS Left 07/06/2012   Procedure: IRRIGATION AND DEBRIDEMENT ABSCESS BUTTOCKS AND THIGH;  Surgeon: Shann Medal, MD;  Location: [facility];  Service: General;  Laterality: Left;  . IRRIGATION AND DEBRIDEMENT ABSCESS Left 08/10/2012   Procedure: IRRIGATION AND DEBRIDEMENT ABSCESS;  Surgeon: Madilyn Hook, DO;  Location: [facility];  Service: General;  Laterality: Left;  . STUMP REVISION Left 06/09/2017   Procedure: REVISION LEFT BELOW KNEE AMPUTATION;  Surgeon: Newt Minion, MD;  Location: [facility];  Service: Orthopedics;   Laterality: Left;  . TONSILLECTOMY  1994   Social History   Occupational History  . Occupation: disability  Tobacco Use  . Smoking status: Current Every Day Smoker    Packs/day: 0.50    Years: 15.00    Pack years: 7.50    Types: Cigarettes  . Smokeless tobacco: Never Used  . Tobacco comment: Cutting back.   Substance and Sexual Activity  . Alcohol use: Yes    Alcohol/week: 0.0 oz    Comment: Socially-" monthy  maybe"  . Drug use: No  . Sexual activity: Yes

## 2017-08-22 ENCOUNTER — Other Ambulatory Visit: Payer: Self-pay | Admitting: Physical Medicine & Rehabilitation

## 2017-08-28 ENCOUNTER — Other Ambulatory Visit: Payer: Self-pay | Admitting: Physical Medicine & Rehabilitation

## 2017-08-28 DIAGNOSIS — E1159 Type 2 diabetes mellitus with other circulatory complications: Secondary | ICD-10-CM

## 2017-08-28 DIAGNOSIS — I10 Essential (primary) hypertension: Principal | ICD-10-CM

## 2017-08-28 NOTE — Telephone Encounter (Signed)
rec'd electronic request for Bumex.  Patient is not following up with physical medicine and rehabilitation. Dr. Posey Pronto defers to PCP, Colbert Ewing, MD

## 2017-08-31 ENCOUNTER — Other Ambulatory Visit (INDEPENDENT_AMBULATORY_CARE_PROVIDER_SITE_OTHER): Payer: Self-pay

## 2017-08-31 ENCOUNTER — Ambulatory Visit (INDEPENDENT_AMBULATORY_CARE_PROVIDER_SITE_OTHER): Payer: Medicare Other | Admitting: Orthopedic Surgery

## 2017-08-31 ENCOUNTER — Encounter (INDEPENDENT_AMBULATORY_CARE_PROVIDER_SITE_OTHER): Payer: Self-pay | Admitting: Orthopedic Surgery

## 2017-08-31 VITALS — Ht 70.0 in | Wt 328.0 lb

## 2017-08-31 DIAGNOSIS — T8781 Dehiscence of amputation stump: Secondary | ICD-10-CM

## 2017-08-31 DIAGNOSIS — Z89512 Acquired absence of left leg below knee: Secondary | ICD-10-CM

## 2017-08-31 MED ORDER — OXYCODONE-ACETAMINOPHEN 5-325 MG PO TABS: 1.0000 | ORAL_TABLET | Freq: Three times a day (TID) | ORAL | 0 refills | Status: DC | PRN

## 2017-08-31 NOTE — Addendum Note (Signed)
Addended by: Meridee Score on: 08/31/2017 11:14 AM   Modules accepted: Orders

## 2017-08-31 NOTE — Progress Notes (Addendum)
Office Visit Note   Patient: [name] [name]           Date of Birth: [birth year]           MRN: [id number] Visit Date: 08/31/2017              Requested by: Colbert Ewing, MD [address] [place], [place] [address] PCP: Colbert Ewing, MD  Chief Complaint  Patient presents with  . Left Leg - Follow-up    06/09/17 left BKA revision       HPI: Patient is seen in follow-up for left transtibial amputation.  Her stump shrinker's are completely torn apart.  She has clear drainage from the residual limb from dependent edema.  Patient is completed a one-month course of doxycycline.  Patient does complain of pain and soreness primarily phantom pain and some is probably from the venous and lymphatic swelling.  She is currently on Lyrica 2 mg 3 times a day.  Assessment & Plan: Visit Diagnoses:  1. Status post below knee amputation, left ([place])   2. Dehiscence of amputation stump (HCC)     Plan: Discussed the importance of elevation and compression.  She is given a new prescription for [employer] for compression stump shrinker's and a prescription for a K2 level prosthesis.  Again discussed the importance of elevating her leg swelling to resolve and allow for prosthetic fitting.  Follow-Up Instructions: Return in about 2 weeks (around 09/14/2017).   Ortho Exam  Patient is alert, oriented, no adenopathy, well-dressed, normal affect, normal respiratory effort. Examination patient has macerated skin over the residual limb.  There is clear drainage.  There is no redness no cellulitis no odor no signs of infection.  Patient's problem seems to be primarily venous and lymphatic insufficiency with dependent drainage.  Imaging: No results found. No images are attached to the encounter.  Labs: Lab Results  Component Value Date   HGBA1C 6.5 (H) 06/09/2017   HGBA1C 9.0 (H) 03/25/2017   HGBA1C 11.3 (H) 11/21/2016   ESRSEDRATE 130 (H) 03/24/2017   ESRSEDRATE 53 (H) 09/14/2015   ESRSEDRATE 132  (H) 07/06/2012   CRP 5.4 (H) 09/14/2015   CRP 23.0 (H) 07/06/2012   REPTSTATUS 08/13/2017 FINAL 08/11/2017   GRAMSTAIN  08/11/2017    MODERATE WBC PRESENT, PREDOMINANTLY PMN FEW GRAM POSITIVE COCCI    CULT  08/11/2017    ABUNDANT GROUP B STREP(S.AGALACTIAE)ISOLATED TESTING AGAINST S. AGALACTIAE NOT ROUTINELY PERFORMED DUE TO PREDICTABILITY OF AMP/PEN/VAN SUSCEPTIBILITY. Performed at [facility], [place] [address], [place], [place] [address]    LABORGA ENTEROCOCCUS FAECALIS 06/09/2017    @LABSALLVALUES (HGBA1)@  Body mass index is 47.06 kg/m.  Orders:  No orders of the defined types were placed in this encounter.  No orders of the defined types were placed in this encounter.    Procedures: No procedures performed  Clinical Data: No additional findings.  ROS:  All other systems negative, except as noted in the HPI. Review of Systems  Objective: Vital Signs: Ht 5\' 10"  (1.778 m)   Wt (!) 328 lb (148.8 kg)   BMI 47.06 kg/m   Specialty Comments:  No specialty comments available.  PMFS History: Patient Active Problem List   Diagnosis Date Noted  . Dehiscence of amputation stump ([place])   . Fall   . Stage 3 chronic kidney disease ([place])   . Peripheral edema   . Poorly controlled type 2 diabetes mellitus with peripheral neuropathy ([place])   . Status post below knee amputation,  left ([place]) 04/11/2017  . Post-operative pain   . Bipolar affective disorder ([place])   . Type 2 diabetes mellitus with peripheral neuropathy (HCC)   . OSA (obstructive sleep apnea)   . Super obese   . Leukocytosis   . Acute blood loss anemia   . Adjustment disorder with depressed mood 03/26/2017  . Severe sepsis ([place]) 03/24/2017  . Abnormal ECG 03/24/2017  . Acute bilateral low back pain 02/13/2017  . Heart failure with preserved ejection fraction ([place]) 02/09/2017  . Jerking movements of extremities 12/08/2016  . Routine adult health maintenance 12/08/2016  . Chronic pain of right knee  09/08/2016  . Abdominal muscle pain 09/08/2016  . Vertigo 08/05/2016  . Lightheadedness 07/15/2016  . CKD (chronic kidney disease) stage 2, GFR 60-89 ml/min 07/15/2016  . Bilateral lower extremity edema 05/13/2016  . OSA on CPAP 05/13/2016  . Vaginal pruritus 03/14/2016  . Morbid obesity due to excess calories ([place]) 11/27/2015  . Callus of foot 11/26/2015  . History of Low serum cortisol level ([place]) 01/07/2015  . Financial difficulty 04/25/2013  . Uncontrolled type 2 diabetes mellitus ([place]) 07/12/2012  . Necrotizing fasciitis s/p OR debridements 07/06/2012  . Carpal tunnel syndrome, bilateral 01/25/2012  . Diabetic peripheral neuropathy ([place]) 07/04/2011  . Anxiety and depression 06/02/2011  . History of abnormal cervical Pap smear 12/13/2007  . GERD 12/06/2007  . Hyperlipidemia 08/29/2007  . Hypertension associated with diabetes ([place]) 08/29/2007   Past Medical History:  Diagnosis Date  . Abnormal Pap smear of cervix 2009  . Abscess    history of multiple abscesses  . Anemia of chronic disease 2002  . Anxiety    Panic attacks  . Bipolar disorder ([place])   . Cellulitis 05/21/2014   right eye  . Chronic bronchitis ([place])    "get it q yr" (05/13/2013)  . Chronic pain   . Depression   . Edema of lower extremity   . Endocarditis 2002   subacute bacterial endocarditis.   . Family history of anesthesia complication    "my mom has a hard time coming out from under"  . Fibromyalgia   . GERD (gastroesophageal reflux disease)    occ  . Heart murmur   . History of blood transfusion    "just low blood count" (05/13/2013)  . Hyperlipidemia   . Hypertension   . Hypothyroidism   . Hypothyroidism, adult 03/21/2014  . Necrosis ([place])    and ulceration  . Obesity   . OSA on CPAP    does not wear CPAP  . Peripheral neuropathy   . Type II diabetes mellitus (HCC)    Type  II    Family History  Problem Relation Age of Onset  . Heart failure Mother   . Diabetes Mother   . Kidney  disease Mother   . Kidney disease Father   . Diabetes Father   . Diabetes Paternal Grandmother   . Heart failure Paternal Grandmother   . Other Other     Past Surgical History:  Procedure Laterality Date  . AMPUTATION Left 04/07/2017   Procedure: LEFT BELOW KNEE AMPUTATION;  Surgeon: Newt Minion, MD;  Location: [facility];  Service: Orthopedics;  Laterality: Left;  . EYE SURGERY     lazer  . INCISION AND DRAINAGE ABSCESS     multiple I&Ds  . INCISION AND DRAINAGE ABSCESS Left 07/09/2012   Procedure: DRESSING CHANGE, THIGH WOUND;  Surgeon: Harl Bowie, MD;  Location: [facility];  Service: General;  Laterality:  Left;  . INCISION AND DRAINAGE OF WOUND Left 07/07/2012   Procedure: IRRIGATION AND DEBRIDEMENT WOUND;  Surgeon: Harl Bowie, MD;  Location: [facility];  Service: General;  Laterality: Left;  . INCISION AND DRAINAGE PERIRECTAL ABSCESS Left 07/14/2012   Procedure: DEBRIDEMENT OF SKIN & SOFT TISSUE; DRESSING CHANGE UNDER ANESTHESIA;  Surgeon: Gayland Curry, MD,FACS;  Location: [facility];  Service: General;  Laterality: Left;  . INCISION AND DRAINAGE PERIRECTAL ABSCESS Left 07/16/2012   Procedure: I&D Left Thigh;  Surgeon: Gwenyth Ober, MD;  Location: [facility];  Service: General;  Laterality: Left;  . INCISION AND DRAINAGE PERIRECTAL ABSCESS N/A 01/05/2015   Procedure: IRRIGATION AND DEBRIDEMENT PERIRECTAL ABSCESS;  Surgeon: Donnie Mesa, MD;  Location: [facility];  Service: General;  Laterality: N/A;  . IRRIGATION AND DEBRIDEMENT ABSCESS Left 07/06/2012   Procedure: IRRIGATION AND DEBRIDEMENT ABSCESS BUTTOCKS AND THIGH;  Surgeon: Shann Medal, MD;  Location: [facility];  Service: General;  Laterality: Left;  . IRRIGATION AND DEBRIDEMENT ABSCESS Left 08/10/2012   Procedure: IRRIGATION AND DEBRIDEMENT ABSCESS;  Surgeon: Madilyn Hook, DO;  Location: [facility];  Service: General;  Laterality: Left;  . STUMP REVISION Left 06/09/2017   Procedure: REVISION LEFT BELOW KNEE AMPUTATION;  Surgeon: Newt Minion, MD;   Location: [facility];  Service: Orthopedics;  Laterality: Left;  . TONSILLECTOMY  1994   Social History   Occupational History  . Occupation: disability  Tobacco Use  . Smoking status: Current Every Day Smoker    Packs/day: 0.50    Years: 15.00    Pack years: 7.50    Types: Cigarettes  . Smokeless tobacco: Never Used  . Tobacco comment: Cutting back.   Substance and Sexual Activity  . Alcohol use: Yes    Alcohol/week: 0.0 oz    Comment: Socially-" monthy  maybe"  . Drug use: No  . Sexual activity: Yes

## 2017-09-04 ENCOUNTER — Telehealth (INDEPENDENT_AMBULATORY_CARE_PROVIDER_SITE_OTHER): Payer: Self-pay | Admitting: Orthopedic Surgery

## 2017-09-04 NOTE — Telephone Encounter (Signed)
Called pt to advise that the updated order had been faxed to [facility] last week and again today. The mailbox is full and I am unable to leave a message. I will try again later.

## 2017-09-04 NOTE — Telephone Encounter (Signed)
Patient left a voicemail to see if she can have more wound care supplies sent to [facility]. Patients CB # [phone]

## 2017-09-05 ENCOUNTER — Telehealth (INDEPENDENT_AMBULATORY_CARE_PROVIDER_SITE_OTHER): Payer: Self-pay | Admitting: Orthopedic Surgery

## 2017-09-05 NOTE — Telephone Encounter (Signed)
Tried to call pt and there is no answer. Mailbox is full. Will try again later.

## 2017-09-05 NOTE — Telephone Encounter (Signed)
[name] [name] from [facility] called wanting to let Dr. Sharol Given know that the patient has medicare and because of that the insurance wont cover twice a day dressing changes, they tried calling her to let her know but her voicemail is full. CB # [phone] option 3

## 2017-09-07 ENCOUNTER — Other Ambulatory Visit: Payer: Self-pay

## 2017-09-07 ENCOUNTER — Ambulatory Visit (INDEPENDENT_AMBULATORY_CARE_PROVIDER_SITE_OTHER): Payer: Medicare Other | Admitting: Internal Medicine

## 2017-09-07 ENCOUNTER — Other Ambulatory Visit: Payer: Self-pay | Admitting: Internal Medicine

## 2017-09-07 ENCOUNTER — Encounter: Payer: Self-pay | Admitting: Internal Medicine

## 2017-09-07 VITALS — BP 134/82 | HR 100 | Temp 98.1°F | Ht 70.0 in | Wt 329.3 lb

## 2017-09-07 DIAGNOSIS — E1142 Type 2 diabetes mellitus with diabetic polyneuropathy: Secondary | ICD-10-CM | POA: Diagnosis not present

## 2017-09-07 DIAGNOSIS — F17210 Nicotine dependence, cigarettes, uncomplicated: Secondary | ICD-10-CM

## 2017-09-07 DIAGNOSIS — R011 Cardiac murmur, unspecified: Secondary | ICD-10-CM

## 2017-09-07 DIAGNOSIS — Z23 Encounter for immunization: Secondary | ICD-10-CM

## 2017-09-07 DIAGNOSIS — Z794 Long term (current) use of insulin: Secondary | ICD-10-CM | POA: Diagnosis not present

## 2017-09-07 DIAGNOSIS — Z9989 Dependence on other enabling machines and devices: Secondary | ICD-10-CM

## 2017-09-07 DIAGNOSIS — N189 Chronic kidney disease, unspecified: Secondary | ICD-10-CM | POA: Diagnosis not present

## 2017-09-07 DIAGNOSIS — F419 Anxiety disorder, unspecified: Secondary | ICD-10-CM

## 2017-09-07 DIAGNOSIS — I152 Hypertension secondary to endocrine disorders: Secondary | ICD-10-CM

## 2017-09-07 DIAGNOSIS — E1165 Type 2 diabetes mellitus with hyperglycemia: Secondary | ICD-10-CM | POA: Diagnosis not present

## 2017-09-07 DIAGNOSIS — E1159 Type 2 diabetes mellitus with other circulatory complications: Secondary | ICD-10-CM

## 2017-09-07 DIAGNOSIS — G546 Phantom limb syndrome with pain: Secondary | ICD-10-CM | POA: Diagnosis not present

## 2017-09-07 DIAGNOSIS — I5030 Unspecified diastolic (congestive) heart failure: Secondary | ICD-10-CM | POA: Diagnosis not present

## 2017-09-07 DIAGNOSIS — E1122 Type 2 diabetes mellitus with diabetic chronic kidney disease: Secondary | ICD-10-CM

## 2017-09-07 DIAGNOSIS — I130 Hypertensive heart and chronic kidney disease with heart failure and stage 1 through stage 4 chronic kidney disease, or unspecified chronic kidney disease: Secondary | ICD-10-CM

## 2017-09-07 DIAGNOSIS — Z9119 Patient's noncompliance with other medical treatment and regimen: Secondary | ICD-10-CM

## 2017-09-07 DIAGNOSIS — G4733 Obstructive sleep apnea (adult) (pediatric): Secondary | ICD-10-CM | POA: Diagnosis not present

## 2017-09-07 DIAGNOSIS — F32A Depression, unspecified: Secondary | ICD-10-CM

## 2017-09-07 DIAGNOSIS — I5032 Chronic diastolic (congestive) heart failure: Secondary | ICD-10-CM

## 2017-09-07 DIAGNOSIS — M545 Low back pain, unspecified: Secondary | ICD-10-CM

## 2017-09-07 DIAGNOSIS — Z79899 Other long term (current) drug therapy: Secondary | ICD-10-CM | POA: Diagnosis not present

## 2017-09-07 DIAGNOSIS — Z89512 Acquired absence of left leg below knee: Secondary | ICD-10-CM

## 2017-09-07 DIAGNOSIS — I10 Essential (primary) hypertension: Secondary | ICD-10-CM

## 2017-09-07 DIAGNOSIS — F329 Major depressive disorder, single episode, unspecified: Secondary | ICD-10-CM

## 2017-09-07 DIAGNOSIS — Z79891 Long term (current) use of opiate analgesic: Secondary | ICD-10-CM

## 2017-09-07 DIAGNOSIS — Z0000 Encounter for general adult medical examination without abnormal findings: Secondary | ICD-10-CM

## 2017-09-07 DIAGNOSIS — F418 Other specified anxiety disorders: Secondary | ICD-10-CM | POA: Diagnosis not present

## 2017-09-07 LAB — POCT GLYCOSYLATED HEMOGLOBIN (HGB A1C): Hemoglobin A1C: 9.6

## 2017-09-07 LAB — GLUCOSE, CAPILLARY: GLUCOSE-CAPILLARY: 157 mg/dL — AB (ref 65–99)

## 2017-09-07 MED ORDER — INSULIN GLARGINE 100 UNITS/ML SOLOSTAR PEN: 50.0000 [IU] | PEN_INJECTOR | Freq: Two times a day (BID) | SUBCUTANEOUS | 11 refills | Status: DC

## 2017-09-07 MED ORDER — INSULIN ASPART 100 UNIT/ML FLEXPEN: 13.0000 [IU] | PEN_INJECTOR | Freq: Three times a day (TID) | SUBCUTANEOUS | 1 refills | Status: DC

## 2017-09-07 MED ORDER — DICLOFENAC SODIUM 1 % TD GEL
TRANSDERMAL | 2 refills | Status: DC
Start: 2017-09-07 — End: 2017-12-05

## 2017-09-07 MED ORDER — FLUOXETINE HCL 20 MG PO CAPS: 20.0000 mg | ORAL_CAPSULE | Freq: Two times a day (BID) | ORAL | 0 refills | Status: DC

## 2017-09-07 NOTE — Assessment & Plan Note (Signed)
Stable - Continue lyrica and amitriptyline

## 2017-09-07 NOTE — Assessment & Plan Note (Signed)
Assessment Reports compliance with fluoxetine 20mg  BID and amitriptyline 50mg  QHS. PHQ-9 score is 9. Denies SI/HI. Has had multiple stressors recently, including her mother passing recently as well as all of the complications with the L BKA and revision. She states that she does feel she has reasons to go on, however she would be interested in counseling.  Plan - Referral to psychology for counseling - Continue fluoxetine and amitriptyline

## 2017-09-07 NOTE — Assessment & Plan Note (Signed)
Assessment Reports generalized fatigue. Does report that she does not use CPAP consistently every night. Discussed that some of her fatigue may be secondary to untreated CPAP. Discussed starting with adherence to CPAP first to assess for improvement in fatigue.  Plan - Recommended adherence to CPAP

## 2017-09-07 NOTE — Progress Notes (Signed)
CC: management of BP  HPI:  [name] [name] is a 42 y.o. female with PMH of HTN, HFpEF, OSA, DM2, CKD, depression/anxiety, and s/p L BKA who presents for management of BP.  HTN, HFpEF: BP 153/86, repeat 134/82. Reports compliance with bumex 1mg  BID. Denies headaches, chest pain, or shortness of breath. Denies DOE or orthopnea or LE edema.  S/p L BKA 03/2017 and revision 05/2017: Following with Dr. Sharol Given. Complicated by post-op infection, requiring doxycycline and keflex. Completed course of antibiotics about 1 week ago. She states Dr. Sharol Given is planning to watch off of antibiotics for now. She continues to endorse stabbing/scraping/burning pain in her stump, from below her knee. She states it is intermittent but particularly bad after dressing changes. She is on Percocet 5-325mg  q6-8h PRN, prescribed by Dr. Sharol Given. She states that Percocet is only sometimes helpful for her pain. She also reports compliance with amitriptyline 50mg  QHS and Lyrica 100mg  TID. She has an appointment to see Dr. Sharol Given again next week.  Depression/anxiety: She reports she is on fluoxetine 20mg  BID and amitriptyline 50mg  QHS. She has been under a lot of stress recently due to her mother passing recently. With the L BKA and revision, she also feels that a lot has happened to her in the past few months. She does state that she has reasons to go on, including her nephew and other family members, however she is still trying to cope with the stump. PHQ-9 score is 9 today. She denies SI/HI. She is interested in counseling.  Diabetes: A1c 9.6 today, worsened from 6.5 in January 2019. She reports compliance with lantus 44u BID, novolog 13u TID, and victoza 1.8mg  daily. She has a glucometer at home, but does state that she does not check her BG often. She did not bring her glucometer with her. She denies symptomatic hypoglycemic episodes.  Please see the assessment and plan below for the status of the patient's chronic medical  problems.  Past Medical History:  Diagnosis Date  . Abnormal Pap smear of cervix 2009  . Abscess    history of multiple abscesses  . Anemia of chronic disease 2002  . Anxiety    Panic attacks  . Bipolar disorder ([place])   . Cellulitis 05/21/2014   right eye  . Chronic bronchitis ([place])    "get it q yr" (05/13/2013)  . Chronic pain   . Depression   . Edema of lower extremity   . Endocarditis 2002   subacute bacterial endocarditis.   . Family history of anesthesia complication    "my mom has a hard time coming out from under"  . Fibromyalgia   . GERD (gastroesophageal reflux disease)    occ  . Heart murmur   . History of blood transfusion    "just low blood count" (05/13/2013)  . Hyperlipidemia   . Hypertension   . Hypothyroidism   . Hypothyroidism, adult 03/21/2014  . Necrosis ([place])    and ulceration  . Obesity   . OSA on CPAP    does not wear CPAP  . Peripheral neuropathy   . Type II diabetes mellitus (HCC)    Type  II   Review of Systems:   GEN: Negative for fevers, positive for fatigue CV: Negative for chest pain PULM: Negative for shortness of breath MSK: Positive for LLE stump pain  Physical Exam:  Vitals:   09/07/17 1455 09/07/17 1607  BP: (!) 153/86 134/82  Pulse: 100   Temp: 98.1 F (36.7 C)  TempSrc: Oral   SpO2: 99%   Weight: (!) 329 lb 4.8 oz (149.4 kg)   Height: 5\' 10"  (1.778 m)    GEN: Sitting in wheelchair in NAD CV: Tachycardic and RR, systolic murmur, best heard at LUSB PULM: CTAB, no wheezes or rales ABD: Obese, Soft, NT, ND, +BS EXT: No LE edema. S/p L BKA. LLE in bandage and sock wrapping.  Assessment & Plan:   See Encounters Tab for problem based charting.  Patient discussed with Dr. Evette Doffing

## 2017-09-07 NOTE — Patient Instructions (Addendum)
FOLLOW-UP INSTRUCTIONS When: 1 month For: BP management What to bring: medications, glucometer   [name] [name],  It was good to meet you today.  For your diabetes, please INCREASE your lantus to 50 units twice a day. Please continue your novolog and victoza.  Please continue seeing Dr. Sharol Given for amputation care.  We are sending a referral for a counselor to help with depression.  Your blood pressure looks good. Please continue taking your blood pressure medicines.

## 2017-09-07 NOTE — Assessment & Plan Note (Signed)
Assessment Currently on lantus 44u BID, novolog 13u TID, and victoza 1.8mg  daily. She reports compliance with these medications. A1c worsened at 9.6 today. She has a glucometer at home, but reports that she does not check her BG very often. She did not bring her glucometer with her to this visit. Denies hypoglycemic episodes.  Plan - Increase lantus to 50u BID - Continue novolog 13u TID and victoza 1.8mg  daily - F/u in 1 month - Advised patient to check BG at home and to bring glucometer to next visit

## 2017-09-07 NOTE — Assessment & Plan Note (Signed)
Assessment Following with Dr. Sharol Given. Required L BKA for cellulitis in 03/2017 and revision 05/2017. She has required multiple courses of antibiotics, most recently completed doxy and keflex about 1 week ago. She has a follow up appointment with Dr. Sharol Given next week.  She continues to have pain in her LLE - likely phantom pain. She is on amitriptyline 50mg  QHS, Lyrica 100mg  TID, and Percocet 5-325mg  q8h PRN (prescribed by Dr. Sharol Given). Discussed with her that the side effects of increasing amitriptyline are likely not worth the extra benefit they might have on decreasing her pain. She is still able to function and has not reached a point where her pain is affecting her ability to complete her daily activities, however we discussed that if her pain does get to that level, then we can continue to search for alternate pain medications. Also advised continued follow up with Dr. Sharol Given.  Plan - Continue f/u with Dr. Sharol Given - Continue Percocet, amitriptyline, and lyrica

## 2017-09-07 NOTE — Assessment & Plan Note (Signed)
Assessment Euvolemic on exam. Denies DOE, orthopnea, or LE edema  Plan - Continue bumex 1mg  BID - F/u in 1 month

## 2017-09-07 NOTE — Assessment & Plan Note (Signed)
Assessment BP 153/86, repeat 134/82. Reports compliance with Bumex 1mg  BID. Denies LE edema, orthopnea, or DOE. Euvolemic on exam. Weight stable compared to 1 month ago. Has been off of lisinopril since November 2018 due to ARF and hyperkalemia during her hospitalization.  Plan - Continue bumex 1mg  BID - F/u in 1 month - BMP at next visit - May benefit from restarting ACE/ARB therapy at next visit with hx of DM

## 2017-09-07 NOTE — Assessment & Plan Note (Signed)
Tetanus/TDAP TDAP administered in clinic today

## 2017-09-11 ENCOUNTER — Telehealth (INDEPENDENT_AMBULATORY_CARE_PROVIDER_SITE_OTHER): Payer: Self-pay | Admitting: Orthopedic Surgery

## 2017-09-11 NOTE — Telephone Encounter (Signed)
Mckayla from [facility] called saying that the patient is requesting more wound care supplies then her insurance is willing to cover, and would like to know if there is any additional supplies that advanced could give her. CB # [phone] option 3

## 2017-09-11 NOTE — Progress Notes (Signed)
Internal Medicine Clinic Attending  Case discussed with Dr. Huang at the time of the visit.  We reviewed the resident's history and exam and pertinent patient test results.  I agree with the assessment, diagnosis, and plan of care documented in the resident's note. 

## 2017-09-12 NOTE — Telephone Encounter (Signed)
I called and sw [facility] and they stated that the pt was receiving more bandages while she was under [facility] visits but they have d/c her due to non compliance and that this has reduced the number of bandages that she was getting. I advised that the pt has an appt on Thursday and that we can talk about this with her and we can send over another order to restart services and adjust supplies if that is something that the pt and dr. Sharol Given decide to do otherwise medicare only allows for one flat fee for dressing changes once a day and any adjustment would have to be product selection that is different then what she is already receiving.  Will hold message until Thursday.

## 2017-09-14 ENCOUNTER — Encounter (INDEPENDENT_AMBULATORY_CARE_PROVIDER_SITE_OTHER): Payer: Self-pay | Admitting: Orthopedic Surgery

## 2017-09-14 ENCOUNTER — Ambulatory Visit (INDEPENDENT_AMBULATORY_CARE_PROVIDER_SITE_OTHER): Payer: Medicare Other | Admitting: Orthopedic Surgery

## 2017-09-14 VITALS — Ht 70.0 in | Wt 329.0 lb

## 2017-09-14 DIAGNOSIS — Z89512 Acquired absence of left leg below knee: Secondary | ICD-10-CM

## 2017-09-14 MED ORDER — OXYCODONE-ACETAMINOPHEN 5-325 MG PO TABS
1.0000 | ORAL_TABLET | Freq: Three times a day (TID) | ORAL | 0 refills | Status: DC | PRN
Start: 2017-09-14 — End: 2017-11-30

## 2017-09-14 NOTE — Progress Notes (Signed)
Office Visit Note   Patient: [name] [name]           Date of Birth: Sep 15, 1975           MRN: [id number] Visit Date: 09/14/2017              Requested by: Colbert Ewing, MD [address] [place], [place] [address] PCP: Colbert Ewing, MD  Chief Complaint  Patient presents with  . 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  Patient states that she fell on her leg this past Saturday.  She states that [facility] is discontinued with home health therapy due to noncompliance.  Patient states that Medicare only allows for 1 visit dressing change.  Assessment & Plan: Visit Diagnoses:  1. Status post below knee amputation, left ([place])     Plan: The incision is well-healed and Ace wrap was applied she will follow-up with [employer] for a new stump shrinker.  Follow-Up Instructions: Return in about 3 weeks (around 10/05/2017).   Ortho Exam  Patient is alert, oriented, no adenopathy, well-dressed, normal affect, normal respiratory effort. Examination there is no swelling over the residual no cellulitis no drainage no odor there is no skin breakdown from her falls there is no evidence of a hematoma or skin bruising.  Hemoglobin A1c remains elevated at 9.6.  Prescription provided for Percocet for pain.  Imaging: No results found. No images are attached to the encounter.  Labs: Lab Results  Component Value Date   HGBA1C 9.6 09/07/2017   HGBA1C 6.5 (H) 06/09/2017   HGBA1C 9.0 (H) 03/25/2017   ESRSEDRATE 130 (H) 03/24/2017   ESRSEDRATE 53 (H) 09/14/2015   ESRSEDRATE 132 (H) 07/06/2012   CRP 5.4 (H) 09/14/2015   CRP 23.0 (H) 07/06/2012   REPTSTATUS 08/13/2017 FINAL 08/11/2017   GRAMSTAIN  08/11/2017    MODERATE WBC PRESENT, PREDOMINANTLY PMN FEW GRAM POSITIVE COCCI    CULT  08/11/2017    ABUNDANT  GROUP B STREP(S.AGALACTIAE)ISOLATED TESTING AGAINST S. AGALACTIAE NOT ROUTINELY PERFORMED DUE TO PREDICTABILITY OF AMP/PEN/VAN SUSCEPTIBILITY. Performed at [facility], [place] [address] [place], [place] [address]    LABORGA ENTEROCOCCUS FAECALIS 06/09/2017    @LABSALLVALUES (HGBA1)@  Body mass index is 47.21 kg/m.  Orders:  No orders of the defined types were placed in this encounter.  Meds ordered this encounter  Medications  . oxyCODONE-acetaminophen (PERCOCET/ROXICET) 5-325 MG tablet    Sig: Take 1 tablet by mouth every 8 (eight) hours as needed for severe pain.    Dispense:  30 tablet    Refill:  0     Procedures: No procedures performed  Clinical Data: No additional findings.  ROS:  All other systems negative, except as noted in the HPI. Review of Systems  Objective: Vital Signs: Ht 5\' 10"  (1.778 m)   Wt (!) 329 lb (149.2 kg)   BMI 47.21 kg/m   Specialty Comments:  No specialty comments available.  PMFS History: Patient Active Problem List   Diagnosis Date Noted  . Dehiscence of amputation stump ([place])   . Status post below knee amputation, left ([place]) 04/11/2017  . Bipolar affective disorder ([place])   . Leukocytosis   . Acute blood loss anemia   . Adjustment disorder with depressed mood 03/26/2017  . Abnormal ECG 03/24/2017  .  Acute bilateral low back pain 02/13/2017  . Heart failure with preserved ejection fraction ([place]) 02/09/2017  . Routine adult health maintenance 12/08/2016  . Chronic pain of right knee 09/08/2016  . Abdominal muscle pain 09/08/2016  . CKD (chronic kidney disease) stage 2, GFR 60-89 ml/min 07/15/2016  . Bilateral lower extremity edema 05/13/2016  . OSA (obstructive sleep apnea) 05/13/2016  . Morbid obesity due to excess calories ([place]) 11/27/2015  . History of Low serum cortisol level ([place]) 01/07/2015  . Financial difficulty 04/25/2013  . Uncontrolled type 2 diabetes mellitus ([place]) 07/12/2012  . Necrotizing fasciitis s/p OR  debridements 07/06/2012  . Carpal tunnel syndrome, bilateral 01/25/2012  . Diabetic peripheral neuropathy ([place]) 07/04/2011  . Anxiety and depression 06/02/2011  . History of abnormal cervical Pap smear 12/13/2007  . GERD 12/06/2007  . Hyperlipidemia 08/29/2007  . Hypertension associated with diabetes ([place]) 08/29/2007   Past Medical History:  Diagnosis Date  . Abnormal Pap smear of cervix 2009  . Abscess    history of multiple abscesses  . Anemia of chronic disease 2002  . Anxiety    Panic attacks  . Bipolar disorder ([place])   . Cellulitis 05/21/2014   right eye  . Chronic bronchitis ([place])    "get it q yr" (05/13/2013)  . Chronic pain   . Depression   . Edema of lower extremity   . Endocarditis 2002   subacute bacterial endocarditis.   . Family history of anesthesia complication    "my mom has a hard time coming out from under"  . Fibromyalgia   . GERD (gastroesophageal reflux disease)    occ  . Heart murmur   . History of blood transfusion    "just low blood count" (05/13/2013)  . Hyperlipidemia   . Hypertension   . Hypothyroidism   . Hypothyroidism, adult 03/21/2014  . Necrosis ([place])    and ulceration  . Obesity   . OSA on CPAP    does not wear CPAP  . Peripheral neuropathy   . Type II diabetes mellitus (HCC)    Type  II    Family History  Problem Relation Age of Onset  . Heart failure Mother   . Diabetes Mother   . Kidney disease Mother   . Kidney disease Father   . Diabetes Father   . Diabetes Paternal Grandmother   . Heart failure Paternal Grandmother   . Other Other     Past Surgical History:  Procedure Laterality Date  . AMPUTATION Left 04/07/2017   Procedure: LEFT BELOW KNEE AMPUTATION;  Surgeon: Newt Minion, MD;  Location: [facility];  Service: Orthopedics;  Laterality: Left;  . EYE SURGERY     lazer  . INCISION AND DRAINAGE ABSCESS     multiple I&Ds  . INCISION AND DRAINAGE ABSCESS Left 07/09/2012   Procedure: DRESSING CHANGE, THIGH WOUND;   Surgeon: Harl Bowie, MD;  Location: [facility];  Service: General;  Laterality: Left;  . INCISION AND DRAINAGE OF WOUND Left 07/07/2012   Procedure: IRRIGATION AND DEBRIDEMENT WOUND;  Surgeon: Harl Bowie, MD;  Location: [facility];  Service: General;  Laterality: Left;  . INCISION AND DRAINAGE PERIRECTAL ABSCESS Left 07/14/2012   Procedure: DEBRIDEMENT OF SKIN & SOFT TISSUE; DRESSING CHANGE UNDER ANESTHESIA;  Surgeon: Gayland Curry, MD,FACS;  Location: [facility];  Service: General;  Laterality: Left;  . INCISION AND DRAINAGE PERIRECTAL ABSCESS Left 07/16/2012   Procedure: I&D Left Thigh;  Surgeon: Gwenyth Ober, MD;  Location: [facility]  OR;  Service: General;  Laterality: Left;  . INCISION AND DRAINAGE PERIRECTAL ABSCESS N/A 01/05/2015   Procedure: IRRIGATION AND DEBRIDEMENT PERIRECTAL ABSCESS;  Surgeon: Donnie Mesa, MD;  Location: [facility];  Service: General;  Laterality: N/A;  . IRRIGATION AND DEBRIDEMENT ABSCESS Left 07/06/2012   Procedure: IRRIGATION AND DEBRIDEMENT ABSCESS BUTTOCKS AND THIGH;  Surgeon: Shann Medal, MD;  Location: [facility];  Service: General;  Laterality: Left;  . IRRIGATION AND DEBRIDEMENT ABSCESS Left 08/10/2012   Procedure: IRRIGATION AND DEBRIDEMENT ABSCESS;  Surgeon: Madilyn Hook, DO;  Location: [facility];  Service: General;  Laterality: Left;  . STUMP REVISION Left 06/09/2017   Procedure: REVISION LEFT BELOW KNEE AMPUTATION;  Surgeon: Newt Minion, MD;  Location: [facility];  Service: Orthopedics;  Laterality: Left;  . TONSILLECTOMY  1994   Social History   Occupational History  . Occupation: disability  Tobacco Use  . Smoking status: Current Every Day Smoker    Packs/day: 0.50    Years: 15.00    Pack years: 7.50    Types: Cigarettes  . Smokeless tobacco: Never Used  . Tobacco comment: Cutting back.   Substance and Sexual Activity  . Alcohol use: Yes    Alcohol/week: 0.0 oz    Comment: Socially-" monthy  maybe"  . Drug use: No  . Sexual activity: Yes

## 2017-10-05 ENCOUNTER — Encounter (INDEPENDENT_AMBULATORY_CARE_PROVIDER_SITE_OTHER): Payer: Self-pay | Admitting: Orthopedic Surgery

## 2017-10-05 ENCOUNTER — Encounter: Payer: Self-pay | Admitting: Internal Medicine

## 2017-10-05 ENCOUNTER — Ambulatory Visit (INDEPENDENT_AMBULATORY_CARE_PROVIDER_SITE_OTHER): Payer: Medicare Other | Admitting: Orthopedic Surgery

## 2017-10-05 VITALS — Ht 70.0 in | Wt 329.0 lb

## 2017-10-05 DIAGNOSIS — Z89512 Acquired absence of left leg below knee: Secondary | ICD-10-CM

## 2017-10-05 MED ORDER — OXYCODONE-ACETAMINOPHEN 5-325 MG PO TABS: 1.0000 | ORAL_TABLET | Freq: Three times a day (TID) | ORAL | 0 refills | Status: DC | PRN

## 2017-10-05 NOTE — Progress Notes (Signed)
Office Visit Note   Patient: [name] [name]           Date of Birth: 04/24/1976           MRN: [id number] Visit Date: 10/05/2017              Requested by: Colbert Ewing, MD [address] [place], [place] [address] PCP: Colbert Ewing, MD  Chief Complaint  Patient presents with  . Left Leg - Follow-up, Wound Check      HPI: Patient is seen 4 months status post left transtibial amputation.  Patient states she has had chronic swelling in her lower extremities that has not been relieved with medication she states she is currently on Bumex.  She states she still has clear drainage from the residual limb.  Assessment & Plan: Visit Diagnoses: No diagnosis found.  Plan: Discussed that we can provide 1 additional prescription for Percocet she should been given her chronic pain medicine from her primary care physician continue with dry dressing continue with compression.  Discussed the importance of protein supplements.  Follow-Up Instructions: No follow-ups on file.   Ortho Exam  Patient is alert, oriented, no adenopathy, well-dressed, normal affect, normal respiratory effort. Examination patient's residual limb is consolidating nicely there is no redness no cellulitis no open wounds she does have clear drainage from the end of the residual limb most likely to chronic lymph and venous insufficiency, and severe protein malnutrition.  Imaging: No results found. No images are attached to the encounter.  Labs: Lab Results  Component Value Date   HGBA1C 9.6 09/07/2017   HGBA1C 6.5 (H) 06/09/2017   HGBA1C 9.0 (H) 03/25/2017   ESRSEDRATE 130 (H) 03/24/2017   ESRSEDRATE 53 (H) 09/14/2015   ESRSEDRATE 132 (H) 07/06/2012   CRP 5.4 (H) 09/14/2015   CRP 23.0 (H) 07/06/2012   REPTSTATUS 08/13/2017 FINAL 08/11/2017   GRAMSTAIN  08/11/2017    MODERATE WBC PRESENT, PREDOMINANTLY PMN FEW GRAM POSITIVE COCCI    CULT  08/11/2017    ABUNDANT GROUP B  STREP(S.AGALACTIAE)ISOLATED TESTING AGAINST S. AGALACTIAE NOT ROUTINELY PERFORMED DUE TO PREDICTABILITY OF AMP/PEN/VAN SUSCEPTIBILITY. Performed at [facility], [place] [address] [place],  [address]    LABORGA ENTEROCOCCUS FAECALIS 06/09/2017     Lab Results  Component Value Date   ALBUMIN 1.9 (L) 08/11/2017   ALBUMIN 2.9 (L) 06/09/2017   ALBUMIN 2.3 (L) 04/12/2017    Body mass index is 47.21 kg/m.  Orders:  No orders of the defined types were placed in this encounter.  No orders of the defined types were placed in this encounter.    Procedures: No procedures performed  Clinical Data: No additional findings.  ROS:  All other systems negative, except as noted in the HPI. Review of Systems  Objective: Vital Signs: Ht 5\' 10"  (1.778 m)   Wt (!) 329 lb (149.2 kg)   BMI 47.21 kg/m   Specialty Comments:  No specialty comments available.  PMFS History: Patient Active Problem List   Diagnosis Date Noted  . Dehiscence of amputation stump ([place])   . Status post below knee amputation, left ([place]) 04/11/2017  . Bipolar affective disorder ([place])   . Leukocytosis   . Acute blood loss anemia   . Adjustment disorder with depressed mood 03/26/2017  . Abnormal ECG 03/24/2017  . Acute bilateral low back pain 02/13/2017  . Heart failure with preserved ejection fraction ([place]) 02/09/2017  . Routine adult health maintenance 12/08/2016  . Chronic pain of  right knee 09/08/2016  . Abdominal muscle pain 09/08/2016  . CKD (chronic kidney disease) stage 2, GFR 60-89 ml/min 07/15/2016  . Bilateral lower extremity edema 05/13/2016  . OSA (obstructive sleep apnea) 05/13/2016  . Morbid obesity due to excess calories ([place]) 11/27/2015  . History of Low serum cortisol level ([place]) 01/07/2015  . Financial difficulty 04/25/2013  . Uncontrolled type 2 diabetes mellitus ([place]) 07/12/2012  . Necrotizing fasciitis s/p OR debridements 07/06/2012  . Carpal tunnel syndrome, bilateral  01/25/2012  . Diabetic peripheral neuropathy ([place]) 07/04/2011  . Anxiety and depression 06/02/2011  . History of abnormal cervical Pap smear 12/13/2007  . GERD 12/06/2007  . Hyperlipidemia 08/29/2007  . Hypertension associated with diabetes ([place]) 08/29/2007   Past Medical History:  Diagnosis Date  . Abnormal Pap smear of cervix 2009  . Abscess    history of multiple abscesses  . Anemia of chronic disease 2002  . Anxiety    Panic attacks  . Bipolar disorder ([place])   . Cellulitis 05/21/2014   right eye  . Chronic bronchitis ([place])    "get it q yr" (05/13/2013)  . Chronic pain   . Depression   . Edema of lower extremity   . Endocarditis 2002   subacute bacterial endocarditis.   . Family history of anesthesia complication    "my mom has a hard time coming out from under"  . Fibromyalgia   . GERD (gastroesophageal reflux disease)    occ  . Heart murmur   . History of blood transfusion    "just low blood count" (05/13/2013)  . Hyperlipidemia   . Hypertension   . Hypothyroidism   . Hypothyroidism, adult 03/21/2014  . Necrosis ([place])    and ulceration  . Obesity   . OSA on CPAP    does not wear CPAP  . Peripheral neuropathy   . Type II diabetes mellitus (HCC)    Type  II    Family History  Problem Relation Age of Onset  . Heart failure Mother   . Diabetes Mother   . Kidney disease Mother   . Kidney disease Father   . Diabetes Father   . Diabetes Paternal Grandmother   . Heart failure Paternal Grandmother   . Other Other     Past Surgical History:  Procedure Laterality Date  . AMPUTATION Left 04/07/2017   Procedure: LEFT BELOW KNEE AMPUTATION;  Surgeon: Newt Minion, MD;  Location: [facility];  Service: Orthopedics;  Laterality: Left;  . EYE SURGERY     lazer  . INCISION AND DRAINAGE ABSCESS     multiple I&Ds  . INCISION AND DRAINAGE ABSCESS Left 07/09/2012   Procedure: DRESSING CHANGE, THIGH WOUND;  Surgeon: Harl Bowie, MD;  Location: [facility];  Service:  General;  Laterality: Left;  . INCISION AND DRAINAGE OF WOUND Left 07/07/2012   Procedure: IRRIGATION AND DEBRIDEMENT WOUND;  Surgeon: Harl Bowie, MD;  Location: [facility];  Service: General;  Laterality: Left;  . INCISION AND DRAINAGE PERIRECTAL ABSCESS Left 07/14/2012   Procedure: DEBRIDEMENT OF SKIN & SOFT TISSUE; DRESSING CHANGE UNDER ANESTHESIA;  Surgeon: Gayland Curry, MD,FACS;  Location: [facility];  Service: General;  Laterality: Left;  . INCISION AND DRAINAGE PERIRECTAL ABSCESS Left 07/16/2012   Procedure: I&D Left Thigh;  Surgeon: Gwenyth Ober, MD;  Location: [facility];  Service: General;  Laterality: Left;  . INCISION AND DRAINAGE PERIRECTAL ABSCESS N/A 01/05/2015   Procedure: IRRIGATION AND DEBRIDEMENT PERIRECTAL ABSCESS;  Surgeon: Donnie Mesa,  MD;  Location: [facility];  Service: General;  Laterality: N/A;  . IRRIGATION AND DEBRIDEMENT ABSCESS Left 07/06/2012   Procedure: IRRIGATION AND DEBRIDEMENT ABSCESS BUTTOCKS AND THIGH;  Surgeon: Shann Medal, MD;  Location: [facility];  Service: General;  Laterality: Left;  . IRRIGATION AND DEBRIDEMENT ABSCESS Left 08/10/2012   Procedure: IRRIGATION AND DEBRIDEMENT ABSCESS;  Surgeon: Madilyn Hook, DO;  Location: [facility];  Service: General;  Laterality: Left;  . STUMP REVISION Left 06/09/2017   Procedure: REVISION LEFT BELOW KNEE AMPUTATION;  Surgeon: Newt Minion, MD;  Location: [facility];  Service: Orthopedics;  Laterality: Left;  . TONSILLECTOMY  1994   Social History   Occupational History  . Occupation: disability  Tobacco Use  . Smoking status: Current Every Day Smoker    Packs/day: 0.50    Years: 15.00    Pack years: 7.50    Types: Cigarettes  . Smokeless tobacco: Never Used  . Tobacco comment: Cutting back.   Substance and Sexual Activity  . Alcohol use: Yes    Alcohol/week: 0.0 oz    Comment: Socially-" monthy  maybe"  . Drug use: No  . Sexual activity: Yes

## 2017-10-31 ENCOUNTER — Other Ambulatory Visit: Payer: Self-pay | Admitting: Internal Medicine

## 2017-10-31 DIAGNOSIS — E1142 Type 2 diabetes mellitus with diabetic polyneuropathy: Secondary | ICD-10-CM

## 2017-11-06 ENCOUNTER — Encounter (INDEPENDENT_AMBULATORY_CARE_PROVIDER_SITE_OTHER): Payer: Self-pay | Admitting: Orthopedic Surgery

## 2017-11-06 ENCOUNTER — Ambulatory Visit (INDEPENDENT_AMBULATORY_CARE_PROVIDER_SITE_OTHER): Payer: Medicare Other | Admitting: Orthopedic Surgery

## 2017-11-06 DIAGNOSIS — Z89512 Acquired absence of left leg below knee: Secondary | ICD-10-CM

## 2017-11-06 MED ORDER — OXYCODONE-ACETAMINOPHEN 5-325 MG PO TABS: 1.0000 | ORAL_TABLET | Freq: Three times a day (TID) | ORAL | 0 refills | Status: DC | PRN

## 2017-11-06 NOTE — Progress Notes (Signed)
Office Visit Note   Patient: [name] [name]           Date of Birth: 01/07/1976           MRN: [id number] Visit Date: 11/06/2017              Requested by: Colbert Ewing, MD [address] [place], [place] [address] PCP: Ina Homes, MD  Chief Complaint  Patient presents with  . Left Leg - Follow-up    06/09/17 Revision Left Below Knee Amputation      HPI: Patient is a 42 year old woman who presents 7 months status post left transtibial amputation.  Patient states that she does have congestive heart failure she is on Bumex twice a day she states she has a follow-up appointment with her cardiologist in several weeks to see if there is any other medical treatments to decrease the edema.  Assessment & Plan: Visit Diagnoses:  1. Status post below knee amputation, left ([place])     Plan: Patient was given a prescription for [employer] for a custom stump shrinker on the left. Recommended protein nutrition supplement to help decrease the edema.  Follow-Up Instructions: Return in about 1 month (around 12/04/2017).   Ortho Exam  Patient is alert, oriented, no adenopathy, well-dressed, normal affect, normal respiratory effort. Examination patient does have venous and lymphatic insufficiency with swelling.  Her calf measures 59 cm in circumference.  There is one small area of granulation tissue that is 3 mm in diameter 1 mm deep with 100% beefy granulation tissue there is no purulence no odor no drainage no cellulitis no tenderness to palpation no signs of infection.  Imaging: No results found. No images are attached to the encounter.  Labs: Lab Results  Component Value Date   HGBA1C 9.6 09/07/2017   HGBA1C 6.5 (H) 06/09/2017   HGBA1C 9.0 (H) 03/25/2017   ESRSEDRATE 130 (H) 03/24/2017   ESRSEDRATE 53 (H) 09/14/2015   ESRSEDRATE 132 (H) 07/06/2012   CRP 5.4 (H) 09/14/2015   CRP 23.0 (H) 07/06/2012   REPTSTATUS 08/13/2017 FINAL 08/11/2017   GRAMSTAIN  08/11/2017    MODERATE  WBC PRESENT, PREDOMINANTLY PMN FEW GRAM POSITIVE COCCI    CULT  08/11/2017    ABUNDANT GROUP B STREP(S.AGALACTIAE)ISOLATED TESTING AGAINST S. AGALACTIAE NOT ROUTINELY PERFORMED DUE TO PREDICTABILITY OF AMP/PEN/VAN SUSCEPTIBILITY. Performed at [facility], [place] [address], [place], [place] [address]    LABORGA ENTEROCOCCUS FAECALIS 06/09/2017     Lab Results  Component Value Date   ALBUMIN 1.9 (L) 08/11/2017   ALBUMIN 2.9 (L) 06/09/2017   ALBUMIN 2.3 (L) 04/12/2017    There is no height or weight on file to calculate BMI.  Orders:  No orders of the defined types were placed in this encounter.  No orders of the defined types were placed in this encounter.    Procedures: No procedures performed  Clinical Data: No additional findings.  ROS:  All other systems negative, except as noted in the HPI. Review of Systems  Objective: Vital Signs: There were no vitals taken for this visit.  Specialty Comments:  No specialty comments available.  PMFS History: Patient Active Problem List   Diagnosis Date Noted  . Dehiscence of amputation stump ([place])   . Status post below knee amputation, left ([place]) 04/11/2017  . Bipolar affective disorder ([place])   . Leukocytosis   . Acute blood loss anemia   . Adjustment disorder with depressed mood 03/26/2017  . Abnormal ECG 03/24/2017  .  Acute bilateral low back pain 02/13/2017  . Heart failure with preserved ejection fraction ([place]) 02/09/2017  . Routine adult health maintenance 12/08/2016  . Chronic pain of right knee 09/08/2016  . Abdominal muscle pain 09/08/2016  . CKD (chronic kidney disease) stage 2, GFR 60-89 ml/min 07/15/2016  . Bilateral lower extremity edema 05/13/2016  . OSA (obstructive sleep apnea) 05/13/2016  . Morbid obesity due to excess calories ([place]) 11/27/2015  . History of Low serum cortisol level ([place]) 01/07/2015  . Financial difficulty 04/25/2013  . Uncontrolled type 2 diabetes mellitus ([place]) 07/12/2012    . Necrotizing fasciitis s/p OR debridements 07/06/2012  . Carpal tunnel syndrome, bilateral 01/25/2012  . Diabetic peripheral neuropathy ([place]) 07/04/2011  . Anxiety and depression 06/02/2011  . History of abnormal cervical Pap smear 12/13/2007  . GERD 12/06/2007  . Hyperlipidemia 08/29/2007  . Hypertension associated with diabetes ([place]) 08/29/2007   Past Medical History:  Diagnosis Date  . Abnormal Pap smear of cervix 2009  . Abscess    history of multiple abscesses  . Anemia of chronic disease 2002  . Anxiety    Panic attacks  . Bipolar disorder ([place])   . Cellulitis 05/21/2014   right eye  . Chronic bronchitis ([place])    "get it q yr" (05/13/2013)  . Chronic pain   . Depression   . Edema of lower extremity   . Endocarditis 2002   subacute bacterial endocarditis.   . Family history of anesthesia complication    "my mom has a hard time coming out from under"  . Fibromyalgia   . GERD (gastroesophageal reflux disease)    occ  . Heart murmur   . History of blood transfusion    "just low blood count" (05/13/2013)  . Hyperlipidemia   . Hypertension   . Hypothyroidism   . Hypothyroidism, adult 03/21/2014  . Necrosis ([place])    and ulceration  . Obesity   . OSA on CPAP    does not wear CPAP  . Peripheral neuropathy   . Type II diabetes mellitus (HCC)    Type  II    Family History  Problem Relation Age of Onset  . Heart failure Mother   . Diabetes Mother   . Kidney disease Mother   . Kidney disease Father   . Diabetes Father   . Diabetes Paternal Grandmother   . Heart failure Paternal Grandmother   . Other Other     Past Surgical History:  Procedure Laterality Date  . AMPUTATION Left 04/07/2017   Procedure: LEFT BELOW KNEE AMPUTATION;  Surgeon: Newt Minion, MD;  Location: [facility];  Service: Orthopedics;  Laterality: Left;  . EYE SURGERY     lazer  . INCISION AND DRAINAGE ABSCESS     multiple I&Ds  . INCISION AND DRAINAGE ABSCESS Left 07/09/2012   Procedure:  DRESSING CHANGE, THIGH WOUND;  Surgeon: Harl Bowie, MD;  Location: [facility];  Service: General;  Laterality: Left;  . INCISION AND DRAINAGE OF WOUND Left 07/07/2012   Procedure: IRRIGATION AND DEBRIDEMENT WOUND;  Surgeon: Harl Bowie, MD;  Location: [facility];  Service: General;  Laterality: Left;  . INCISION AND DRAINAGE PERIRECTAL ABSCESS Left 07/14/2012   Procedure: DEBRIDEMENT OF SKIN & SOFT TISSUE; DRESSING CHANGE UNDER ANESTHESIA;  Surgeon: Gayland Curry, MD,FACS;  Location: [facility];  Service: General;  Laterality: Left;  . INCISION AND DRAINAGE PERIRECTAL ABSCESS Left 07/16/2012   Procedure: I&D Left Thigh;  Surgeon: Gwenyth Ober, MD;  Location:  [facility];  Service: General;  Laterality: Left;  . INCISION AND DRAINAGE PERIRECTAL ABSCESS N/A 01/05/2015   Procedure: IRRIGATION AND DEBRIDEMENT PERIRECTAL ABSCESS;  Surgeon: Donnie Mesa, MD;  Location: [facility];  Service: General;  Laterality: N/A;  . IRRIGATION AND DEBRIDEMENT ABSCESS Left 07/06/2012   Procedure: IRRIGATION AND DEBRIDEMENT ABSCESS BUTTOCKS AND THIGH;  Surgeon: Shann Medal, MD;  Location: [facility];  Service: General;  Laterality: Left;  . IRRIGATION AND DEBRIDEMENT ABSCESS Left 08/10/2012   Procedure: IRRIGATION AND DEBRIDEMENT ABSCESS;  Surgeon: Madilyn Hook, DO;  Location: [facility];  Service: General;  Laterality: Left;  . STUMP REVISION Left 06/09/2017   Procedure: REVISION LEFT BELOW KNEE AMPUTATION;  Surgeon: Newt Minion, MD;  Location: [facility];  Service: Orthopedics;  Laterality: Left;  . TONSILLECTOMY  1994   Social History   Occupational History  . Occupation: disability  Tobacco Use  . Smoking status: Current Every Day Smoker    Packs/day: 0.50    Years: 15.00    Pack years: 7.50    Types: Cigarettes  . Smokeless tobacco: Never Used  . Tobacco comment: Cutting back.   Substance and Sexual Activity  . Alcohol use: Yes    Alcohol/week: 0.0 oz    Comment: Socially-" monthy  maybe"  . Drug use: No  . Sexual  activity: Yes

## 2017-11-14 ENCOUNTER — Ambulatory Visit: Payer: Medicare Other | Admitting: Psychology

## 2017-11-30 ENCOUNTER — Encounter: Payer: Self-pay | Admitting: Internal Medicine

## 2017-11-30 ENCOUNTER — Other Ambulatory Visit: Payer: Self-pay

## 2017-11-30 ENCOUNTER — Ambulatory Visit (INDEPENDENT_AMBULATORY_CARE_PROVIDER_SITE_OTHER): Payer: Medicare Other | Admitting: Internal Medicine

## 2017-11-30 VITALS — BP 180/97 | HR 104 | Temp 97.9°F | Ht 70.0 in

## 2017-11-30 DIAGNOSIS — Z89512 Acquired absence of left leg below knee: Secondary | ICD-10-CM | POA: Diagnosis not present

## 2017-11-30 DIAGNOSIS — E039 Hypothyroidism, unspecified: Secondary | ICD-10-CM

## 2017-11-30 DIAGNOSIS — E1165 Type 2 diabetes mellitus with hyperglycemia: Secondary | ICD-10-CM

## 2017-11-30 DIAGNOSIS — I5032 Chronic diastolic (congestive) heart failure: Secondary | ICD-10-CM | POA: Diagnosis not present

## 2017-11-30 DIAGNOSIS — I110 Hypertensive heart disease with heart failure: Secondary | ICD-10-CM

## 2017-11-30 DIAGNOSIS — I10 Essential (primary) hypertension: Secondary | ICD-10-CM

## 2017-11-30 DIAGNOSIS — E1159 Type 2 diabetes mellitus with other circulatory complications: Secondary | ICD-10-CM | POA: Diagnosis not present

## 2017-11-30 DIAGNOSIS — I152 Hypertension secondary to endocrine disorders: Secondary | ICD-10-CM

## 2017-11-30 DIAGNOSIS — N182 Chronic kidney disease, stage 2 (mild): Secondary | ICD-10-CM | POA: Diagnosis not present

## 2017-11-30 LAB — POCT GLYCOSYLATED HEMOGLOBIN (HGB A1C): Hemoglobin A1C: 11.4 % — AB (ref 4.0–5.6)

## 2017-11-30 LAB — GLUCOSE, CAPILLARY: GLUCOSE-CAPILLARY: 238 mg/dL — AB (ref 70–99)

## 2017-11-30 MED ORDER — FLUOXETINE HCL 20 MG PO CAPS: 60.0000 mg | ORAL_CAPSULE | Freq: Every day | ORAL | 1 refills | Status: DC

## 2017-11-30 MED ORDER — PREGABALIN 100 MG PO CAPS: 100.0000 mg | ORAL_CAPSULE | Freq: Three times a day (TID) | ORAL | 0 refills | Status: DC

## 2017-11-30 MED ORDER — FLUOXETINE HCL 20 MG PO CAPS
60.0000 mg | ORAL_CAPSULE | Freq: Every day | ORAL | 1 refills | Status: DC
Start: 2017-11-30 — End: 2017-12-14

## 2017-11-30 NOTE — Patient Instructions (Signed)
It was great meeting you today! Thank you for choosing [facility] for your primary care.   Today we discussed a lot of topics:  1) Diabetes: It is very important we get better control of your blood sugars moving forward! Please take your Lantus 50units twice daily every day in addition to Novolog and Victoza!  -I would also contact Dr. Jodelle Green office for an appointment. You are an established patient and shouldn't have an issue 2) High blood pressure: We need to get better control over your blood pressure as well but I cant start a medication until I know how your kidneys are doing.  -Please stop by the lab on your way out -I will call you tomorrow with medication updates 3) Swelling: This is from your heart failure and uncontrolled blood pressure will make it very difficult to remove fluid. We are going to start a medicine for your blood pressure but its also important to reduce your Fluid or Sodium intake.  -Keep total fluid intake less than 1.5L a day -Keep total sodium intake to less tan 1500mg  a day 4) Weight: I am going to check your thyroid function today. An underactive thyroid can cause weight problems -I've also provided you with information on [facility] 5) Over eating: I've increased your Prozac to 60mg  ONCE daily. Please take this every day. Remember to ask yourself if your stomach is hungry or if you are hungry 6 ) I've referred you to pain management. THey will call you with an appointment.

## 2017-11-30 NOTE — Assessment & Plan Note (Addendum)
Assessment:  BP 180/97 with pulse 104 today. This did not improve dramatically on repeat. Reports compliance with Bumex 1mg  BID but has noticed her BP has been uncontrolled for "awhile.' Previously on Lisinopril/HCTZ but discontinued last fall due to AKI and hyperkalemia in setting of aggressive diuresis (sepsis treatment requiring IVF). She denies any headache, worsened blurred vision (has retinopathy), or neurologic complaint.      Plan: Need to know patients renal function and electrolytes prior to starting an anti-hypertensive today. Advised to continue Bumex for now and to present to ED should she develop any severe headache or neurologic complaint.  -BMET today: ACE-I vs Diltiazem or Verapamil based on renal function and lytes -Has OSA; encouraged to wear CPAP nightly

## 2017-11-30 NOTE — Progress Notes (Deleted)
   CC: ***  HPI:  [name] is a 42 y.o. female who presented to the clinic for continued evaluation and management of her chronic medical illnesses. For a detailed assessment and plan please refer to problem based charting below.   Past Medical History:  Diagnosis Date  . Abnormal Pap smear of cervix 2009  . Abscess    history of multiple abscesses  . Anemia of chronic disease 2002  . Anxiety    Panic attacks  . Bipolar disorder ([place])   . Cellulitis 05/21/2014   right eye  . Chronic bronchitis ([place])    "get it q yr" (05/13/2013)  . Chronic pain   . Depression   . Edema of lower extremity   . Endocarditis 2002   subacute bacterial endocarditis.   . Family history of anesthesia complication    "my mom has a hard time coming out from under"  . Fibromyalgia   . GERD (gastroesophageal reflux disease)    occ  . Heart murmur   . History of blood transfusion    "just low blood count" (05/13/2013)  . Hyperlipidemia   . Hypertension   . Hypothyroidism   . Hypothyroidism, adult 03/21/2014  . Necrosis ([place])    and ulceration  . Obesity   . OSA on CPAP    does not wear CPAP  . Peripheral neuropathy   . Type II diabetes mellitus (HCC)    Type  II   Review of Systems:  ***  Physical Exam: There were no vitals filed for this visit. General: Well nourished *** in no acute distress HENT: Normocephalic, atraumatic, moist mucus membranes Pulm: Good air movement with no wheezing or crackles  CV: RRR, no murmurs, no rubs  Abdomen: Active bowel sounds, soft, non-distended, no tenderness to palpation  Extremities: Pulses palpable in all extremities, no LE edema  Skin: Warm and dry  Neuro: Alert and oriented x 3  Assessment & Plan:   See Encounters Tab for problem based charting.  Patient {GC/GE:3044014::"discussed with","seen with"} Dr. {NAMES:[id number]::"Butcher","Granfortuna","E. Hoffman","Klima","Mullen","Narendra","Raines","Vincent"}

## 2017-11-30 NOTE — Assessment & Plan Note (Signed)
Assessment:  Pt with preserved EF but g2dd on ECHO 2018. No history of reduced EF on prior ECHOs but chiari network of right atrium was noted in 2014 (felt to be an incidental finding/not clinically significant). Compliant with Bumex 1mg  BID and while she doesn't think shes retaining more fluid, she also doesn't feel like it's removing the fluid she has. She reports drinking "A LOT" of fluids to help curb her hunger and thirst from hyperglycemia.      Plan: Volume status difficult to fully interpret given body habitus but does have peripheral edema. Lungs are clear, she's saturating well on RA and weights are stable over the past several months. Need better BP control and fluid restriction moving forward which will likely be challenging for the patient (please continue to encourage fluid vs sodium restriction).  -Continue Bumex 1mg  BID -BP control, 1.5-2L fluid restriction

## 2017-11-30 NOTE — Assessment & Plan Note (Addendum)
Assessment:  Patient admits to not checking her blood sugars at home and reports often missing her Lantus. Usually takes her Novolog and 'always' takes Victoza. She denied any worsened blurred vision, nausea, vomiting, abdominal pain or confusion. She mentions she would like to see Dr. Dwyane Dee again (endo)     Plan: Will get A1c today but unlikely to be improved from prior given non-adherance with Lantus. Do not suspect she's in DKA or HHS but will check for acidosis or gap on BMET -Pt given the office number for Dr. Dwyane Dee; advised to call sooner rather than later for an appointment  -Advised to take all medications as prescribed: continue current regimen of Lantus 50 units BID, Novolog 13 units TID WC and Victoza  ADDENDUM: A1c 11.4%, CBG 238.

## 2017-11-30 NOTE — Assessment & Plan Note (Addendum)
Assessment:  Patient reports frustration over her weight and inability to loose. She does not follow any particular diet plan and admits to significant over-eating. Tells me she is always hungry and when clarifying this, notes it's more that she wants to eat food (as opposed to actual hunger; stomach growling, etc) because she can tell her stomach is full, but wants to keep going. She mentions bariatric surgery noting she attended an online seminar from [facility] but was unsure how to follow this up.     Plan: Suspect patient may have some underlying binge-eating disorder. She also has not made significant effort to improve her diet or increase physical activity (although did undergo L BKA last year and has prosthesis fitting this month). I'm increasing her SSRI, provided her with information on the in-person seminar for [facility] and encouraged diet/exercise.  -Increase Prozac to 60mg  once daily -TSH; treat as indicated

## 2017-11-30 NOTE — Progress Notes (Signed)
   CC: follow-up of multiple complaints  HPI:  [name] is a 42 y.o. F with morbid obesity (BMI 47%), uncontrolled type 2 diabetes with retinopathy, neuropathy, nephropathy and L BKA, uncontrolled HTN, dCHF, history of hypothyroidism, Bipolar disorder and OSA who presents today with multiple complaints.   For details regarding today's visit and the status of their chronic medical issues, please refer to the assessment and plan.  Past Medical History:  Diagnosis Date  . Abdominal muscle pain 09/08/2016  . Abnormal Pap smear of cervix 2009  . Abscess    history of multiple abscesses  . Acute bilateral low back pain 02/13/2017  . Acute blood loss anemia   . Anemia of chronic disease 2002  . Anxiety    Panic attacks  . Bilateral lower extremity edema 05/13/2016  . Bipolar disorder ([place])   . Cellulitis 05/21/2014   right eye  . Chronic bronchitis ([place])    "get it q yr" (05/13/2013)  . Chronic pain   . Depression   . Edema of lower extremity   . Endocarditis 2002   subacute bacterial endocarditis.   . Family history of anesthesia complication    "my mom has a hard time coming out from under"  . Fibromyalgia   . GERD (gastroesophageal reflux disease)    occ  . Heart murmur   . History of blood transfusion    "just low blood count" (05/13/2013)  . Hyperlipidemia   . Hypertension   . Hypothyroidism   . Hypothyroidism, adult 03/21/2014  . Leukocytosis   . Necrosis ([place])    and ulceration  . Obesity   . OSA on CPAP    does not wear CPAP  . Peripheral neuropathy   . Type II diabetes mellitus (HCC)    Type  II   Review of Systems:   General: Admits to Fatigue. Denies fevers, chills, weight loss, confusion HEENT: Denies acute changes in vision, headache Cardiac: Denies CP, SOB Pulmonary: Denies cough, wheezing Abd: Admits to increased appetite. Denies abdominal pain, nausea, vomiting Extremities: Admits to stable chronic swelling. Denies focal weakness or  numbness  Physical Exam: General: Morbidly obese AA female in wheelchair. Alert, in no acute distress. Pleasant. Room smells of marijuana.  HEENT: Often avoids eye contact. No injection, icterus or ptosis. No hoarseness or dysarthria. Mucous membranes moist. Cardiac: RRR Pulmonary: CTA BL with normal WOB on RA. Able to speak in complete sentences Abd: Soft, non-tender. +bs Extremities: Warm, perfused. No significant pedal edema. L BKA with compression stocking.   Vitals:   11/30/17 1454  BP: (!) 180/97  Pulse: (!) 104  Temp: 97.9 F (36.6 C)  TempSrc: Oral  SpO2: 99%  Height: 5\' 10"  (1.778 m)   Body mass index is 47.21 kg/m.  Assessment & Plan:   See Encounters Tab for problem based charting.  Patient discussed with Dr. Dareen Piano

## 2017-11-30 NOTE — Assessment & Plan Note (Signed)
Assessment:  Pt inquires about referral to pain management. Orthopedics has been providing Percocet Rx but unable to do so long-term.      Plan: Happy to refer patient for pain management. Will be continued on Lyrica for now. She did not request narcotics today.

## 2017-11-30 NOTE — Assessment & Plan Note (Signed)
Assessment:  Pt with history of hypothyroidism previously on synthroid. Most recent TSH 3.5 obtained while pt not on synthroid. She expresses frustration over weight and also admits to several other symptoms that could be attributed to hypothyroidism.      Plan: TSH and Free T4 today. Will resume Synthroid if indicated.

## 2017-12-01 LAB — BMP8+ANION GAP
Anion Gap: 11 mmol/L (ref 10.0–18.0)
BUN/Creatinine Ratio: 15 (ref 9–23)
BUN: 17 mg/dL (ref 6–24)
CALCIUM: 8.5 mg/dL — AB (ref 8.7–10.2)
CO2: 24 mmol/L (ref 20–29)
Chloride: 101 mmol/L (ref 96–106)
Creatinine, Ser: 1.13 mg/dL — ABNORMAL HIGH (ref 0.57–1.00)
GFR, EST AFRICAN AMERICAN: 70 mL/min/{1.73_m2} (ref >59)
GFR, EST NON AFRICAN AMERICAN: 61 mL/min/{1.73_m2} (ref >59)
Glucose: 245 mg/dL — ABNORMAL HIGH (ref 65–99)
Potassium: 4.4 mmol/L (ref 3.5–5.2)
Sodium: 136 mmol/L (ref 134–144)

## 2017-12-01 LAB — TSH: TSH: 4.69 u[IU]/mL — ABNORMAL HIGH (ref 0.450–4.500)

## 2017-12-01 LAB — T4, FREE: Free T4: 0.9 ng/dL (ref 0.82–1.77)

## 2017-12-01 NOTE — Progress Notes (Signed)
Internal Medicine Clinic Attending  Case discussed with Dr. Molt at the time of the visit.  We reviewed the resident's history and exam and pertinent patient test results.  I agree with the assessment, diagnosis, and plan of care documented in the resident's note. 

## 2017-12-05 ENCOUNTER — Other Ambulatory Visit: Payer: Self-pay | Admitting: Internal Medicine

## 2017-12-05 DIAGNOSIS — Z89512 Acquired absence of left leg below knee: Secondary | ICD-10-CM

## 2017-12-07 ENCOUNTER — Other Ambulatory Visit: Payer: Self-pay | Admitting: Internal Medicine

## 2017-12-07 NOTE — Telephone Encounter (Signed)
Next appt scheduled 9/5 with PCP. 

## 2017-12-08 ENCOUNTER — Other Ambulatory Visit: Payer: Self-pay | Admitting: Internal Medicine

## 2017-12-11 ENCOUNTER — Ambulatory Visit (INDEPENDENT_AMBULATORY_CARE_PROVIDER_SITE_OTHER): Payer: Medicare Other | Admitting: Orthopedic Surgery

## 2017-12-11 ENCOUNTER — Telehealth: Payer: Self-pay | Admitting: Internal Medicine

## 2017-12-11 ENCOUNTER — Encounter (INDEPENDENT_AMBULATORY_CARE_PROVIDER_SITE_OTHER): Payer: Self-pay | Admitting: Orthopedic Surgery

## 2017-12-11 VITALS — Ht 70.0 in | Wt 329.0 lb

## 2017-12-11 DIAGNOSIS — Z89512 Acquired absence of left leg below knee: Secondary | ICD-10-CM

## 2017-12-11 MED ORDER — OXYCODONE-ACETAMINOPHEN 5-325 MG PO TABS: 1.0000 | ORAL_TABLET | Freq: Three times a day (TID) | ORAL | 0 refills | Status: DC | PRN

## 2017-12-11 NOTE — Progress Notes (Signed)
Office Visit Note   Patient: [name] [name]           Date of Birth: 1975/11/14           MRN: [id number] Visit Date: 12/11/2017              Requested by: Ina Homes, MD [address] [place], [place] [address] PCP: Ina Homes, MD  Chief Complaint  Patient presents with  . Left Knee - Pain, Follow-up, Edema      HPI: Patient is a 42 year old woman who presents in follow-up for a left transtibial amputation.  She has a follow-up appointment today with [facility].  Assessment & Plan: Visit Diagnoses:  1. Status post below knee amputation, left ([place])   2. Morbid obesity due to excess calories [facility])     Plan: Patient is given a refill prescription for Percocet for pain recommended elevation continue with compression.  Discussed that we may need to get her set up with a prosthesis at this time to improve her mobility and this should help further decrease the swelling.  Follow-Up Instructions: Return in about 1 month (around 01/08/2018).   Ortho Exam  Patient is alert, oriented, no adenopathy, well-dressed, normal affect, normal respiratory effort. Examination patient's calf measures 65 cm in circumference there is edema with firmness both lymphatic and venous insufficiency.  There is no open wounds no drainage she is wearing a 4 extra-large stump shrinker which has been torn.  Imaging: No results found. No images are attached to the encounter.  Labs: Lab Results  Component Value Date   HGBA1C 11.4 (A) 11/30/2017   HGBA1C 9.6 09/07/2017   HGBA1C 6.5 (H) 06/09/2017   ESRSEDRATE 130 (H) 03/24/2017   ESRSEDRATE 53 (H) 09/14/2015   ESRSEDRATE 132 (H) 07/06/2012   CRP 5.4 (H) 09/14/2015   CRP 23.0 (H) 07/06/2012   REPTSTATUS 08/13/2017 FINAL 08/11/2017   GRAMSTAIN  08/11/2017    MODERATE WBC PRESENT, PREDOMINANTLY PMN FEW GRAM POSITIVE COCCI    CULT  08/11/2017    ABUNDANT GROUP B STREP(S.AGALACTIAE)ISOLATED TESTING AGAINST S. AGALACTIAE NOT ROUTINELY PERFORMED  DUE TO PREDICTABILITY OF AMP/PEN/VAN SUSCEPTIBILITY. Performed at [facility], [place] [address], [place], [place] [address]    LABORGA ENTEROCOCCUS FAECALIS 06/09/2017     Lab Results  Component Value Date   ALBUMIN 1.9 (L) 08/11/2017   ALBUMIN 2.9 (L) 06/09/2017   ALBUMIN 2.3 (L) 04/12/2017    Body mass index is 47.21 kg/m.  Orders:  No orders of the defined types were placed in this encounter.  Meds ordered this encounter  Medications  . oxyCODONE-acetaminophen (PERCOCET/ROXICET) 5-325 MG tablet    Sig: Take 1 tablet by mouth every 8 (eight) hours as needed.    Dispense:  20 tablet    Refill:  0     Procedures: No procedures performed  Clinical Data: No additional findings.  ROS:  All other systems negative, except as noted in the HPI. Review of Systems  Objective: Vital Signs: Ht 5\' 10"  (1.778 m)   Wt (!) 329 lb (149.2 kg)   BMI 47.21 kg/m   Specialty Comments:  No specialty comments available.  PMFS History: Patient Active Problem List   Diagnosis Date Noted  . Dehiscence of amputation stump ([place])   . Status post below knee amputation, left ([place]) 04/11/2017  . Bipolar affective disorder ([place])   . Adjustment disorder with depressed mood 03/26/2017  . Abnormal ECG 03/24/2017  . Heart failure with preserved ejection fraction (  [place]) 02/09/2017  . Routine adult health maintenance 12/08/2016  . Chronic pain of right knee 09/08/2016  . CKD (chronic kidney disease) stage 2, GFR 60-89 ml/min 07/15/2016  . OSA (obstructive sleep apnea) 05/13/2016  . Morbid obesity due to excess calories ([place]) 11/27/2015  . History of Low serum cortisol level ([place]) 01/07/2015  . Hypothyroidism 04/25/2013  . Financial difficulty 04/25/2013  . Uncontrolled type 2 diabetes mellitus ([place]) 07/12/2012  . Necrotizing fasciitis s/p OR debridements 07/06/2012  . Carpal tunnel syndrome, bilateral 01/25/2012  . Diabetic peripheral neuropathy ([place]) 07/04/2011  . Anxiety and  depression 06/02/2011  . History of abnormal cervical Pap smear 12/13/2007  . GERD 12/06/2007  . Hyperlipidemia 08/29/2007  . Hypertension associated with diabetes ([place]) 08/29/2007   Past Medical History:  Diagnosis Date  . Abdominal muscle pain 09/08/2016  . Abnormal Pap smear of cervix 2009  . Abscess    history of multiple abscesses  . Acute bilateral low back pain 02/13/2017  . Acute blood loss anemia   . Anemia of chronic disease 2002  . Anxiety    Panic attacks  . Bilateral lower extremity edema 05/13/2016  . Bipolar disorder ([place])   . Cellulitis 05/21/2014   right eye  . Chronic bronchitis ([place])    "get it q yr" (05/13/2013)  . Chronic pain   . Depression   . Edema of lower extremity   . Endocarditis 2002   subacute bacterial endocarditis.   . Family history of anesthesia complication    "my mom has a hard time coming out from under"  . Fibromyalgia   . GERD (gastroesophageal reflux disease)    occ  . Heart murmur   . History of blood transfusion    "just low blood count" (05/13/2013)  . Hyperlipidemia   . Hypertension   . Hypothyroidism   . Hypothyroidism, adult 03/21/2014  . Leukocytosis   . Necrosis ([place])    and ulceration  . Obesity   . OSA on CPAP    does not wear CPAP  . Peripheral neuropathy   . Type II diabetes mellitus (H[facility])    Type  II    Family History  Problem Relation Age of Onset  . Heart failure Mother   . Diabetes Mother   . Kidney disease Mother   . Kidney disease Father   . Diabetes Father   . Diabetes Paternal Grandmother   . Heart failure Paternal Grandmother   . Other Other     Past Surgical History:  Procedure Laterality Date  . AMPUTATION Left 04/07/2017   Procedure: LEFT BELOW KNEE AMPUTATION;  Surgeon: Newt Minion, MD;  Location: [facility];  Service: Orthopedics;  Laterality: Left;  . EYE SURGERY     lazer  . INCISION AND DRAINAGE ABSCESS     multiple I&Ds  . INCISION AND DRAINAGE ABSCESS Left 07/09/2012   Procedure:  DRESSING CHANGE, THIGH WOUND;  Surgeon: Harl Bowie, MD;  Location: [facility];  Service: General;  Laterality: Left;  . INCISION AND DRAINAGE OF WOUND Left 07/07/2012   Procedure: IRRIGATION AND DEBRIDEMENT WOUND;  Surgeon: Harl Bowie, MD;  Location: [facility];  Service: General;  Laterality: Left;  . INCISION AND DRAINAGE PERIRECTAL ABSCESS Left 07/14/2012   Procedure: DEBRIDEMENT OF SKIN & SOFT TISSUE; DRESSING CHANGE UNDER ANESTHESIA;  Surgeon: Gayland Curry, MD,FACS;  Location: [facility];  Service: General;  Laterality: Left;  . INCISION AND DRAINAGE PERIRECTAL ABSCESS Left 07/16/2012   Procedure: I&D Left Thigh;  Surgeon: Gwenyth Ober, MD;  Location: [facility];  Service: General;  Laterality: Left;  . INCISION AND DRAINAGE PERIRECTAL ABSCESS N/A 01/05/2015   Procedure: IRRIGATION AND DEBRIDEMENT PERIRECTAL ABSCESS;  Surgeon: Donnie Mesa, MD;  Location: [facility];  Service: General;  Laterality: N/A;  . IRRIGATION AND DEBRIDEMENT ABSCESS Left 07/06/2012   Procedure: IRRIGATION AND DEBRIDEMENT ABSCESS BUTTOCKS AND THIGH;  Surgeon: Shann Medal, MD;  Location: [facility];  Service: General;  Laterality: Left;  . IRRIGATION AND DEBRIDEMENT ABSCESS Left 08/10/2012   Procedure: IRRIGATION AND DEBRIDEMENT ABSCESS;  Surgeon: Madilyn Hook, DO;  Location: [facility];  Service: General;  Laterality: Left;  . STUMP REVISION Left 06/09/2017   Procedure: REVISION LEFT BELOW KNEE AMPUTATION;  Surgeon: Newt Minion, MD;  Location: [facility];  Service: Orthopedics;  Laterality: Left;  . TONSILLECTOMY  1994   Social History   Occupational History  . Occupation: disability  Tobacco Use  . Smoking status: Current Every Day Smoker    Packs/day: 0.50    Years: 15.00    Pack years: 7.50    Types: Cigarettes  . Smokeless tobacco: Never Used  . Tobacco comment: Cutting back.   Substance and Sexual Activity  . Alcohol use: Yes    Alcohol/week: 0.0 oz    Comment: Socially-" monthy  maybe"  . Drug use: No  . Sexual  activity: Yes

## 2017-12-13 NOTE — Addendum Note (Signed)
Addended by: Hulan Fray on: 12/13/2017 04:34 PM   Modules accepted: Orders

## 2017-12-14 ENCOUNTER — Telehealth: Payer: Self-pay | Admitting: Internal Medicine

## 2017-12-14 ENCOUNTER — Other Ambulatory Visit: Payer: Self-pay | Admitting: Internal Medicine

## 2017-12-14 MED ORDER — FLUOXETINE HCL 20 MG/5ML PO SOLN: 60.0000 mg | Freq: Every day | ORAL | 1 refills | Status: DC

## 2017-12-14 NOTE — Telephone Encounter (Signed)
Routing to provider who ordered capsules vs liquid. Hubbard Hartshorn, RN, BSN

## 2017-12-14 NOTE — Telephone Encounter (Signed)
Pls is requesting a nurse call the pharmacy to check on her prescription for Flouxetine liquid, she can only take the liquid and not tablet, pharmacy [facility] [address]; pt contact# [phone]

## 2017-12-14 NOTE — Telephone Encounter (Signed)
Hello, reviewed her chart, she's been on Prozac for yrs and I don't see an Rx for a liquid version ever prescribed. I just called her pharmacy and she has not picked up an Rx since 08/2017. I'll go ahead and send in the Rx for the liquid, but this is kind of unusual as she's able to take all of her other medications in either tablet or capsule.  Forwarding to PCP for [facility]

## 2017-12-19 ENCOUNTER — Encounter: Payer: Self-pay | Admitting: *Deleted

## 2017-12-22 ENCOUNTER — Other Ambulatory Visit: Payer: Self-pay | Admitting: Internal Medicine

## 2017-12-22 NOTE — Telephone Encounter (Signed)
Next appt scheduled 9/5 with PCP. 

## 2017-12-25 ENCOUNTER — Encounter: Payer: Self-pay | Admitting: Physical Medicine & Rehabilitation

## 2017-12-26 ENCOUNTER — Other Ambulatory Visit: Payer: Self-pay | Admitting: *Deleted

## 2017-12-31 ENCOUNTER — Emergency Department (HOSPITAL_COMMUNITY): Payer: Medicare Other

## 2017-12-31 ENCOUNTER — Encounter (HOSPITAL_COMMUNITY): Payer: Self-pay | Admitting: Emergency Medicine

## 2017-12-31 ENCOUNTER — Emergency Department (HOSPITAL_BASED_OUTPATIENT_CLINIC_OR_DEPARTMENT_OTHER): Payer: Medicare Other

## 2017-12-31 ENCOUNTER — Emergency Department (HOSPITAL_COMMUNITY)
Admission: EM | Admit: 2017-12-31 | Discharge: 2017-12-31 | Disposition: A | Discharge status: Home / Self Care | Payer: Medicare Other | Attending: Emergency Medicine | Admitting: Emergency Medicine

## 2017-12-31 DIAGNOSIS — E039 Hypothyroidism, unspecified: Secondary | ICD-10-CM | POA: Insufficient documentation

## 2017-12-31 DIAGNOSIS — M79661 Pain in right lower leg: Secondary | ICD-10-CM | POA: Diagnosis not present

## 2017-12-31 DIAGNOSIS — I129 Hypertensive chronic kidney disease with stage 1 through stage 4 chronic kidney disease, or unspecified chronic kidney disease: Secondary | ICD-10-CM | POA: Insufficient documentation

## 2017-12-31 DIAGNOSIS — Z79899 Other long term (current) drug therapy: Secondary | ICD-10-CM | POA: Insufficient documentation

## 2017-12-31 DIAGNOSIS — M79609 Pain in unspecified limb: Secondary | ICD-10-CM | POA: Diagnosis not present

## 2017-12-31 DIAGNOSIS — E119 Type 2 diabetes mellitus without complications: Secondary | ICD-10-CM | POA: Diagnosis not present

## 2017-12-31 DIAGNOSIS — R2241 Localized swelling, mass and lump, right lower limb: Secondary | ICD-10-CM | POA: Insufficient documentation

## 2017-12-31 DIAGNOSIS — M7989 Other specified soft tissue disorders: Secondary | ICD-10-CM | POA: Diagnosis not present

## 2017-12-31 DIAGNOSIS — E1165 Type 2 diabetes mellitus with hyperglycemia: Secondary | ICD-10-CM | POA: Diagnosis not present

## 2017-12-31 DIAGNOSIS — M79604 Pain in right leg: Secondary | ICD-10-CM | POA: Diagnosis not present

## 2017-12-31 DIAGNOSIS — Z794 Long term (current) use of insulin: Secondary | ICD-10-CM | POA: Insufficient documentation

## 2017-12-31 DIAGNOSIS — R000 Tachycardia, unspecified: Secondary | ICD-10-CM | POA: Diagnosis not present

## 2017-12-31 DIAGNOSIS — N182 Chronic kidney disease, stage 2 (mild): Secondary | ICD-10-CM | POA: Diagnosis not present

## 2017-12-31 DIAGNOSIS — F17210 Nicotine dependence, cigarettes, uncomplicated: Secondary | ICD-10-CM | POA: Diagnosis not present

## 2017-12-31 DIAGNOSIS — I10 Essential (primary) hypertension: Secondary | ICD-10-CM | POA: Diagnosis not present

## 2017-12-31 LAB — BASIC METABOLIC PANEL
Anion gap: 9 (ref 5–15)
BUN: 21 mg/dL — ABNORMAL HIGH (ref 6–20)
CHLORIDE: 95 mmol/L — AB (ref 98–111)
CO2: 27 mmol/L (ref 22–32)
Calcium: 8.5 mg/dL — ABNORMAL LOW (ref 8.9–10.3)
Creatinine, Ser: 1.37 mg/dL — ABNORMAL HIGH (ref 0.44–1.00)
GFR calc Af Amer: 55 mL/min — ABNORMAL LOW (ref >60)
GFR calc non Af Amer: 47 mL/min — ABNORMAL LOW (ref >60)
Glucose, Bld: 450 mg/dL — ABNORMAL HIGH (ref 70–99)
POTASSIUM: 4.5 mmol/L (ref 3.5–5.1)
SODIUM: 131 mmol/L — AB (ref 135–145)

## 2017-12-31 LAB — URINALYSIS, ROUTINE W REFLEX MICROSCOPIC
BILIRUBIN URINE: NEGATIVE
Glucose, UA: >=500 mg/dL — AB
Ketones, ur: NEGATIVE mg/dL
NITRITE: NEGATIVE
Protein, ur: >=300 mg/dL — AB
Specific Gravity, Urine: 1.022 (ref 1.005–1.030)
pH: 6 (ref 5.0–8.0)

## 2017-12-31 LAB — CBC WITH DIFFERENTIAL/PLATELET
ABS IMMATURE GRANULOCYTES: 0.1 10*3/uL (ref 0.0–0.1)
BASOS ABS: 0.1 10*3/uL (ref 0.0–0.1)
Basophils Relative: 1 %
EOS PCT: 3 %
Eosinophils Absolute: 0.2 10*3/uL (ref 0.0–0.7)
HEMATOCRIT: 34.2 % — AB (ref 36.0–46.0)
HEMOGLOBIN: 10.6 g/dL — AB (ref 12.0–15.0)
Immature Granulocytes: 1 %
LYMPHS ABS: 2 10*3/uL (ref 0.7–4.0)
LYMPHS PCT: 26 %
MCH: 24.3 pg — AB (ref 26.0–34.0)
MCHC: 31 g/dL (ref 30.0–36.0)
MCV: 78.4 fL (ref 78.0–100.0)
Monocytes Absolute: 0.7 10*3/uL (ref 0.1–1.0)
Monocytes Relative: 9 %
NEUTROS ABS: 4.8 10*3/uL (ref 1.7–7.7)
Neutrophils Relative %: 60 %
Platelets: 287 10*3/uL (ref 150–400)
RBC: 4.36 MIL/uL (ref 3.87–5.11)
RDW: 14 % (ref 11.5–15.5)
WBC: 7.8 10*3/uL (ref 4.0–10.5)

## 2017-12-31 LAB — PREGNANCY, URINE: PREG TEST UR: NEGATIVE

## 2017-12-31 LAB — ETHANOL: Alcohol, Ethyl (B): <10 mg/dL (ref <10)

## 2017-12-31 LAB — RAPID URINE DRUG SCREEN, HOSP PERFORMED
Amphetamines: NOT DETECTED
BENZODIAZEPINES: NOT DETECTED
Barbiturates: NOT DETECTED
Cocaine: NOT DETECTED
Opiates: NOT DETECTED
Tetrahydrocannabinol: POSITIVE — AB

## 2017-12-31 LAB — I-STAT CG4 LACTIC ACID, ED: LACTIC ACID, VENOUS: 1.75 mmol/L (ref 0.5–1.9)

## 2017-12-31 MED ORDER — SODIUM CHLORIDE 0.9 % IV BOLUS
1000.0000 mL | Freq: Once | INTRAVENOUS | Status: AC
  Administered 2017-12-31: 1000 mL via INTRAVENOUS

## 2017-12-31 MED ORDER — OXYCODONE-ACETAMINOPHEN 5-325 MG PO TABS
1.0000 | ORAL_TABLET | Freq: Once | ORAL | Status: AC
  Administered 2017-12-31: 1 via ORAL
  Filled 2017-12-31: qty 1

## 2017-12-31 NOTE — ED Notes (Signed)
Patient transported to Vascular 

## 2017-12-31 NOTE — ED Provider Notes (Signed)
[facility] EMERGENCY DEPARTMENT Provider Note   CSN: [id number] Arrival date & time: 12/31/17  0306     History   Chief Complaint Chief Complaint  Patient presents with  . Leg Pain    HPI [name] [name] is a 42 y.o. female.  The history is provided by the patient and medical records.  Leg Pain     42 year old female with history of recurrent abscesses, bipolar disorder, anxiety, anemia, chronic pain, depression, history of endocarditis, fibromyalgia, GERD, history of sleep apnea, diabetes, peripheral neuropathy, presenting to the ED with right leg pain.  Initially, EMS was called out for right leg pain.  On their arrival she was sitting on the floor and rocking back and forth.  By time of arrival here in the ED patient is sleeping and somewhat difficult to arouse.  EMS denies giving any medications in route.  Patient's nephew is at the bedside and states she is been complaining of right leg pain and swelling.  He is unsure of any fevers or chills.  Patient will arouse tactile and verbal stimuli but appears drowsy and goes right back to sleep.  Patient is s/p left BKA due to osteomyelitis.  Past Medical History:  Diagnosis Date  . Abdominal muscle pain 09/08/2016  . Abnormal Pap smear of cervix 2009  . Abscess    history of multiple abscesses  . Acute bilateral low back pain 02/13/2017  . Acute blood loss anemia   . Anemia of chronic disease 2002  . Anxiety    Panic attacks  . Bilateral lower extremity edema 05/13/2016  . Bipolar disorder ([place])   . Cellulitis 05/21/2014   right eye  . Chronic bronchitis ([place])    "get it q yr" (05/13/2013)  . Chronic pain   . Depression   . Edema of lower extremity   . Endocarditis 2002   subacute bacterial endocarditis.   . Family history of anesthesia complication    "my mom has a hard time coming out from under"  . Fibromyalgia   . GERD (gastroesophageal reflux disease)    occ  . Heart murmur   . History of  blood transfusion    "just low blood count" (05/13/2013)  . Hyperlipidemia   . Hypertension   . Hypothyroidism   . Hypothyroidism, adult 03/21/2014  . Leukocytosis   . Necrosis ([place])    and ulceration  . Obesity   . OSA on CPAP    does not wear CPAP  . Peripheral neuropathy   . Type II diabetes mellitus ([place])    Type  II    Patient Active Problem List   Diagnosis Date Noted  . Dehiscence of amputation stump ([place])   . Status post below knee amputation, left ([place]) 04/11/2017  . Bipolar affective disorder ([place])   . Adjustment disorder with depressed mood 03/26/2017  . Abnormal ECG 03/24/2017  . Heart failure with preserved ejection fraction ([place]) 02/09/2017  . Routine adult health maintenance 12/08/2016  . Chronic pain of right knee 09/08/2016  . CKD (chronic kidney disease) stage 2, GFR 60-89 ml/min 07/15/2016  . OSA (obstructive sleep apnea) 05/13/2016  . Morbid obesity due to excess calories ([place]) 11/27/2015  . History of Low serum cortisol level ([place]) 01/07/2015  . Hypothyroidism 04/25/2013  . Financial difficulty 04/25/2013  . Uncontrolled type 2 diabetes mellitus ([place]) 07/12/2012  . Necrotizing fasciitis s/p OR debridements 07/06/2012  . Carpal tunnel syndrome, bilateral 01/25/2012  . Diabetic peripheral neuropathy ([place]) 07/04/2011  .  Anxiety and depression 06/02/2011  . History of abnormal cervical Pap smear 12/13/2007  . GERD 12/06/2007  . Hyperlipidemia 08/29/2007  . Hypertension associated with diabetes ([place]) 08/29/2007    Past Surgical History:  Procedure Laterality Date  . AMPUTATION Left 04/07/2017   Procedure: LEFT BELOW KNEE AMPUTATION;  Surgeon: Newt Minion, MD;  Location: [facility];  Service: Orthopedics;  Laterality: Left;  . EYE SURGERY     lazer  . INCISION AND DRAINAGE ABSCESS     multiple I&Ds  . INCISION AND DRAINAGE ABSCESS Left 07/09/2012   Procedure: DRESSING CHANGE, THIGH WOUND;  Surgeon: Harl Bowie, MD;  Location: [facility];  Service:  General;  Laterality: Left;  . INCISION AND DRAINAGE OF WOUND Left 07/07/2012   Procedure: IRRIGATION AND DEBRIDEMENT WOUND;  Surgeon: Harl Bowie, MD;  Location: [facility];  Service: General;  Laterality: Left;  . INCISION AND DRAINAGE PERIRECTAL ABSCESS Left 07/14/2012   Procedure: DEBRIDEMENT OF SKIN & SOFT TISSUE; DRESSING CHANGE UNDER ANESTHESIA;  Surgeon: [place] Curry, MD,FACS;  Location: [facility];  Service: General;  Laterality: Left;  . INCISION AND DRAINAGE PERIRECTAL ABSCESS Left 07/16/2012   Procedure: I&D Left Thigh;  Surgeon: Gwenyth Ober, MD;  Location: [facility];  Service: General;  Laterality: Left;  . INCISION AND DRAINAGE PERIRECTAL ABSCESS N/A 01/05/2015   Procedure: IRRIGATION AND DEBRIDEMENT PERIRECTAL ABSCESS;  Surgeon: Donnie Mesa, MD;  Location: [facility];  Service: General;  Laterality: N/A;  . IRRIGATION AND DEBRIDEMENT ABSCESS Left 07/06/2012   Procedure: IRRIGATION AND DEBRIDEMENT ABSCESS BUTTOCKS AND THIGH;  Surgeon: Shann Medal, MD;  Location: [facility];  Service: General;  Laterality: Left;  . IRRIGATION AND DEBRIDEMENT ABSCESS Left 08/10/2012   Procedure: IRRIGATION AND DEBRIDEMENT ABSCESS;  Surgeon: Madilyn Hook, DO;  Location: [facility];  Service: General;  Laterality: Left;  . STUMP REVISION Left 06/09/2017   Procedure: REVISION LEFT BELOW KNEE AMPUTATION;  Surgeon: Newt Minion, MD;  Location: [facility];  Service: Orthopedics;  Laterality: Left;  . TONSILLECTOMY  1994     OB History   None      Home Medications    Prior to Admission medications   Medication Sig Start Date End Date Taking? Authorizing Provider  amitriptyline (ELAVIL) 50 MG tablet TAKE 1 TABLET BY MOUTH AT BEDTIME 10/31/17   Oda Kilts, MD  Ascorbic Acid (VITAMIN C) 1000 MG tablet Take 1,000 mg by mouth daily.    [provider]  atorvastatin (LIPITOR) 20 MG tablet Take 1 tablet (20 mg total) by mouth daily. 04/25/17   Angiulli, Lavon Paganini, PA-C  bumetanide (BUMEX) 1 MG tablet Take 1  tablet (1 mg total) by mouth 2 (two) times daily. 08/28/17   Oval Linsey, MD  cyclobenzaprine (FLEXERIL) 10 MG tablet TAKE 1 TABLET BY MOUTH EVERY TWELVE HOURS AS NEEDED for MUSCLE SPASMS 12/26/17   Ina Homes, MD  diclofenac sodium (VOLTAREN) 1 % GEL Apply 4 grams to affected area 4 times daily as needed for pain 12/11/17   Sid Falcon, MD  docusate sodium (COLACE) 100 MG capsule Take 1 capsule (100 mg total) by mouth 2 (two) times daily. Patient taking differently: Take 100 mg by mouth 2 (two) times daily as needed for moderate constipation.  04/25/17   Angiulli, Lavon Paganini, PA-C  FLUoxetine (PROZAC) 20 MG/5ML solution Take 15 mLs (60 mg total) by mouth daily. 12/14/17   Molt, Bethany, DO  insulin aspart (NOVOLOG FLEXPEN) 100 UNIT/ML FlexPen Inject 13  Units into the skin 3 (three) times daily with meals. 09/07/17   Colbert Ewing, MD  insulin glargine (LANTUS) 100 unit/mL SOPN Inject 0.5 mLs (50 Units total) into the skin 2 (two) times daily. 09/07/17   Colbert Ewing, MD  Multiple Vitamins-Calcium (ONE-A-DAY WOMENS PO) Take 1 tablet by mouth daily.    [provider]  mupirocin ointment (BACTROBAN) 2 % Apply 1 application topically 2 (two) times daily. 06/28/17   Suzan Slick, NP  oxyCODONE-acetaminophen (PERCOCET/ROXICET) 5-325 MG tablet Take 1 tablet by mouth every 8 (eight) hours as needed. 12/11/17   Newt Minion, MD  polyethylene glycol [facility] Floria Raveling) packet Take 17 g by mouth daily. Patient taking differently: Take 17 g by mouth daily as needed for moderate constipation.  04/25/17   Angiulli, Lavon Paganini, PA-C  pregabalin (LYRICA) 100 MG capsule Take 1 capsule (100 mg total) by mouth 3 (three) times daily. 11/30/17   Molt, Bethany, DO  VICTOZA 18 MG/3ML SOPN Inject 0.3 mLs (1.8 mg total) into the skin daily. 10/31/17   Oda Kilts, MD    Family History Family History  Problem Relation Age of Onset  . Heart failure Mother   . Diabetes Mother   . Kidney disease  Mother   . Kidney disease Father   . Diabetes Father   . Diabetes Paternal Grandmother   . Heart failure Paternal Grandmother   . Other Other     Social History Social History   Tobacco Use  . Smoking status: Current Every Day Smoker    Packs/day: 0.50    Years: 15.00    Pack years: 7.50    Types: Cigarettes  . Smokeless tobacco: Never Used  . Tobacco comment: Cutting back.   Substance Use Topics  . Alcohol use: Yes    Alcohol/week: 0.0 standard drinks    Comment: Socially-" monthy  maybe"  . Drug use: No     Allergies   Vancomycin   Review of Systems Review of Systems  Musculoskeletal: Positive for arthralgias.  All other systems reviewed and are negative.    Physical Exam Updated Vital Signs BP (!) 147/79 (BP Location: Right Arm)   Pulse (!) 109   Temp 98 F (36.7 C) (Oral)   Resp 18   SpO2 98% Comment: Simultaneous filing. User may not have seen previous data.  Physical Exam  Constitutional: She is oriented to person, place, and time. She appears well-developed and well-nourished.  Snoring but arouses to stimuli, appears drowsy  HENT:  Head: Normocephalic and atraumatic.  Mouth/Throat: Oropharynx is clear and moist.  Eyes: Pupils are equal, round, and reactive to light. Conjunctivae and EOM are normal.  Neck: Normal range of motion.  Cardiovascular: Normal rate, regular rhythm and normal heart sounds.  Pulmonary/Chest: Effort normal and breath sounds normal.  Abdominal: Soft. Bowel sounds are normal.  Musculoskeletal: Normal range of motion.  Right lower leg is warm to touch and swollen: DP pulse remains intact, wiggling toes when prompted; good cap refill; no apparent wounds/sores of the leg or foot; no fluctuance, no tissue crepitus; no pallor; skin does have some chronic appearing venous stasis changes Left BKA  Neurological: She is alert and oriented to person, place, and time.  Skin: Skin is warm and dry.  Psychiatric: She has a normal mood and  affect.  Nursing note and vitals reviewed.    ED Treatments / Results  Labs (all labs ordered are listed, but only abnormal results are displayed) Labs Reviewed  CBC WITH DIFFERENTIAL/PLATELET - Abnormal; Notable for the following components:      Result Value   Hemoglobin 10.6 (*)    HCT 34.2 (*)    MCH 24.3 (*)    All other components within normal limits  BASIC METABOLIC PANEL - Abnormal; Notable for the following components:   Sodium 131 (*)    Chloride 95 (*)    Glucose, Bld 450 (*)    BUN 21 (*)    Creatinine, Ser 1.37 (*)    Calcium 8.5 (*)    GFR calc non Af Amer 47 (*)    GFR calc Af Amer 55 (*)    All other components within normal limits  RAPID URINE DRUG SCREEN, HOSP PERFORMED - Abnormal; Notable for the following components:   Tetrahydrocannabinol POSITIVE (*)    All other components within normal limits  URINALYSIS, ROUTINE W REFLEX MICROSCOPIC - Abnormal; Notable for the following components:   Color, Urine STRAW (*)    Glucose, UA >=500 (*)    Hgb urine dipstick MODERATE (*)    Protein, ur >=300 (*)    Leukocytes, UA TRACE (*)    Bacteria, UA RARE (*)    All other components within normal limits  ETHANOL  PREGNANCY, URINE  I-STAT CG4 LACTIC ACID, ED    EKG None  Radiology Dg Tibia/fibula Right  Result Date: 12/31/2017 CLINICAL DATA:  42 y/o  F; right leg and foot pain with swelling. EXAM: RIGHT TIBIA AND FIBULA - 2 VIEW COMPARISON:  None. FINDINGS: There is no evidence of fracture or other focal bone lesions. Soft tissues are unremarkable. Mild osteoarthrosis of the knee joint with tricompartmental osteophytosis. IMPRESSION: No acute fracture or dislocation identified. Electronically Signed   By: Kristine Garbe M.D.   On: 12/31/2017 05:01   Dg Foot Complete Right  Result Date: 12/31/2017 CLINICAL DATA:  42 y/o  F; right leg and foot pain with swelling. EXAM: RIGHT FOOT COMPLETE - 3+ VIEW COMPARISON:  None. FINDINGS: There is no evidence of  fracture or dislocation. Chronic deformity of the head of first proximal phalanx. Ankle joint and intertarsal osteoarthrosis with periarticular osteophytes. Lisfranc alignment is maintained. Pes planus. IMPRESSION: 1. No acute fracture or dislocation identified. 2. Pes planus. 3. Ankle joint and intertarsal osteoarthrosis with periarticular osteophytes. 4. Chronic deformity of the first proximal phalanx head, probably sequelae of old trauma or surgery. Electronically Signed   By: Kristine Garbe M.D.   On: 12/31/2017 05:03    Procedures Procedures (including critical care time)  Medications Ordered in ED Medications - No data to display   Initial Impression / Assessment and Plan / ED Course  I have reviewed the triage vital signs and the nursing notes.  Pertinent labs & imaging results that were available during my care of the patient were reviewed by me and considered in my medical decision making (see chart for details).  42 year old female presenting to the ED with right leg pain.  On EMS arrival she was sitting on the floor rocking back and forth and screaming in pain, however on arrival here she is sleeping and somewhat difficult to arouse.  She does awake to verbal and tactile stimuli but appears somewhat drowsy.  Family at bedside reports she has not taken any medications and was not given anything by EMS.  Right leg does have some peripheral edema of the calf through the foot with some warmth to touch.  There is no open wounds or sores.  No tissue crepitus.  DP pulses intact.  She has good cap refill and distal perfusion.  Will send basic labs, get screening films.  5:12 AM Patient now awake and screaming after using bedside commode.  Given dose of her home pain medication.  Awaiting labs.  X-rays without acute findings.  Labs overall reassuring aside from hyperglycemia.  No evidence of DKA as anion gap normal.  UA non-infectious, no ketones in urine.  UDS is + for THC.    At  this time, no clear explanation of patient's pain.  Do not suspect acute arterial occlusion as she has good distal pulse, motor function, and foot is warm to touch. Consider DVT given pain localized to right calf with swelling and warmth.  Patient is fairly immobile given left BKA and her obese status.  Will get LE venous duplex.  Care signed out to PA Lawyer at shift change.  Will follow-up on venous duplex, re-evaluate, and disposition.  Final Clinical Impressions(s) / ED Diagnoses   Final diagnoses:  Right leg pain    ED Discharge Orders    None       Larene Pickett, PA-C 12/31/17 0654    Palumbo, April, MD 12/31/17 [id number]

## 2017-12-31 NOTE — ED Notes (Signed)
Patient transported to X-ray 

## 2017-12-31 NOTE — Progress Notes (Signed)
VASCULAR LAB PRELIMINARY  PRELIMINARY  PRELIMINARY  PRELIMINARY  Right lower extremity venous duplex completed.    Preliminary report:  There is no DVT or SVT noted in the right lower extremity.  Called results to Dr. Antonieta Loveless, [facility], RVT 12/31/2017, 8:39 AM

## 2017-12-31 NOTE — ED Triage Notes (Signed)
Pt from Home, per EMS pt began having right foot cramping, stated it felt like "the other one before they took it off."  Pt was rocking and crying w/ pain when EMS arrived, when they arrived at ED, pt appeared very tired and had to be woken to speak to her.

## 2017-12-31 NOTE — Discharge Instructions (Addendum)
Return here as needed.  Follow-up with your primary doctor as soon as possible.

## 2018-01-01 ENCOUNTER — Other Ambulatory Visit: Payer: Self-pay | Admitting: Internal Medicine

## 2018-01-01 ENCOUNTER — Telehealth: Payer: Self-pay | Admitting: *Deleted

## 2018-01-01 DIAGNOSIS — Z89512 Acquired absence of left leg below knee: Secondary | ICD-10-CM

## 2018-01-01 NOTE — Telephone Encounter (Signed)
CALLED THE [facility] FOR STATUS OF PAIN REFERRAL. LVM FOR LISA TO RETURN CALL TO [phone].

## 2018-01-02 ENCOUNTER — Telehealth: Payer: Self-pay | Admitting: Internal Medicine

## 2018-01-02 NOTE — Telephone Encounter (Signed)
Called [facility] - pharmacist stated unsure what the call was about and will call back it needed.

## 2018-01-02 NOTE — Telephone Encounter (Signed)
Pls call [facility] [phone] regarding medicine

## 2018-01-03 ENCOUNTER — Other Ambulatory Visit: Payer: Self-pay | Admitting: Internal Medicine

## 2018-01-03 MED ORDER — FLUOXETINE HCL 20 MG/5ML PO SOLN: 60.0000 mg | Freq: Every day | ORAL | 1 refills | Status: DC

## 2018-01-03 NOTE — Telephone Encounter (Signed)
Hey guys,   The largest bottle available in the database is 160mLs... I can send in an Rx for 4 bottles to cover her? Theres no documentation of her being on liquid Prozac before and she takes the remainder of her meds.. Strongly encourage patient to try caps again. Doesn't make sense! Rx sent to pharmacy.

## 2018-01-03 NOTE — Telephone Encounter (Signed)
Received fax from [facility] stating 120 mL of fluoxetine written on 12/14/2017 is only an 8 day supply. Requesting 450 mL which is 30 day supply. Will send to [facility] provider who wrote original Rx. Hubbard Hartshorn, RN, BSN

## 2018-01-08 ENCOUNTER — Encounter (INDEPENDENT_AMBULATORY_CARE_PROVIDER_SITE_OTHER): Payer: Self-pay | Admitting: Orthopedic Surgery

## 2018-01-08 ENCOUNTER — Ambulatory Visit (INDEPENDENT_AMBULATORY_CARE_PROVIDER_SITE_OTHER): Payer: Medicare Other | Admitting: Orthopedic Surgery

## 2018-01-08 VITALS — Ht 70.0 in | Wt 329.0 lb

## 2018-01-08 DIAGNOSIS — Z89512 Acquired absence of left leg below knee: Secondary | ICD-10-CM

## 2018-01-08 MED ORDER — OXYCODONE-ACETAMINOPHEN 5-325 MG PO TABS
1.0000 | ORAL_TABLET | ORAL | 0 refills | Status: DC | PRN
Start: 2018-01-08 — End: 2018-01-16

## 2018-01-08 NOTE — Progress Notes (Signed)
Office Visit Note   Patient: [name] [name]           Date of Birth: [birth year]           MRN: [id number] Visit Date: 01/08/2018              Requested by: Ina Homes, MD [address] [place], [place] [address] PCP: Ina Homes, MD  Chief Complaint  Patient presents with  . Right Leg - Follow-up    Follow up for 12/31/17 visit for RLE pain  . Left Leg - Follow-up    05/2017 revision left BKA      HPI: Patient is a 42 year old woman status post transtibial amputation on the left.  She has massive lymphedema and venous insufficiency she states she had cramping that started on her left leg went to the right leg and going up to her chest and arms.  Patient states she went to the emergency room on August 11 this year she states that radiographs Doppler and all studies were normal.  Her blood sugar was 450.  Patient is currently wearing her silicone sleeve liner.  Assessment & Plan: Visit Diagnoses:  1. Status post below knee amputation, left ([place])   2. Morbid obesity due to excess calories ([place])     Plan: Recommended she wear her stump shrinker underneath the silicone sleeve liner she needs something to absorb the drainage.  We discussed glucose control patient was recommended to get some coconut water and drink about 2 to 3 ounces a day to help restore her potassium stores.  Follow-Up Instructions: Return in about 4 weeks (around 02/05/2018).   Ortho Exam  Patient is alert, oriented, no adenopathy, well-dressed, normal affect, normal respiratory effort. Patient states she is on a fluid pill.  Examination she has clear weeping edema from the left leg she has some blisters that are superficial medially on the thigh with good granulation tissue at the base.  Patient has uncontrolled type 2 diabetes her most recent hemoglobin A1c was 11.4.  There is no cellulitis no odor no signs of infection.  Patient is ambulating in a wheelchair.  Imaging: No results found. No images are  attached to the encounter.  Labs: Lab Results  Component Value Date   HGBA1C 11.4 (A) 11/30/2017   HGBA1C 9.6 09/07/2017   HGBA1C 6.5 (H) 06/09/2017   ESRSEDRATE 130 (H) 03/24/2017   ESRSEDRATE 53 (H) 09/14/2015   ESRSEDRATE 132 (H) 07/06/2012   CRP 5.4 (H) 09/14/2015   CRP 23.0 (H) 07/06/2012   REPTSTATUS 08/13/2017 FINAL 08/11/2017   GRAMSTAIN  08/11/2017    MODERATE WBC PRESENT, PREDOMINANTLY PMN FEW GRAM POSITIVE COCCI    CULT  08/11/2017    ABUNDANT GROUP B STREP(S.AGALACTIAE)ISOLATED TESTING AGAINST S. AGALACTIAE NOT ROUTINELY PERFORMED DUE TO PREDICTABILITY OF AMP/PEN/VAN SUSCEPTIBILITY. Performed at [facility], [place] [address], [place], [place] [address]    LABORGA ENTEROCOCCUS FAECALIS 06/09/2017     Lab Results  Component Value Date   ALBUMIN 1.9 (L) 08/11/2017   ALBUMIN 2.9 (L) 06/09/2017   ALBUMIN 2.3 (L) 04/12/2017    Body mass index is 47.21 kg/m.  Orders:  No orders of the defined types were placed in this encounter.  No orders of the defined types were placed in this encounter.    Procedures: No procedures performed  Clinical Data: No additional findings.  ROS:  All other systems negative, except as noted in the HPI. Review of Systems  Objective: Vital Signs: Ht  5\' 10"  (1.778 m)   Wt (!) 329 lb (149.2 kg)   BMI 47.21 kg/m   Specialty Comments:  No specialty comments available.  PMFS History: Patient Active Problem List   Diagnosis Date Noted  . Dehiscence of amputation stump ([place])   . Status post below knee amputation, left ([place]) 04/11/2017  . Bipolar affective disorder ([place])   . Adjustment disorder with depressed mood 03/26/2017  . Abnormal ECG 03/24/2017  . Heart failure with preserved ejection fraction ([place]) 02/09/2017  . Routine adult health maintenance 12/08/2016  . Chronic pain of right knee 09/08/2016  . CKD (chronic kidney disease) stage 2, GFR 60-89 ml/min 07/15/2016  . OSA (obstructive sleep apnea) 05/13/2016    . Morbid obesity due to excess calories ([place]) 11/27/2015  . History of Low serum cortisol level ([place]) 01/07/2015  . Hypothyroidism 04/25/2013  . Financial difficulty 04/25/2013  . Uncontrolled type 2 diabetes mellitus ([place]) 07/12/2012  . Necrotizing fasciitis s/p OR debridements 07/06/2012  . Carpal tunnel syndrome, bilateral 01/25/2012  . Diabetic peripheral neuropathy ([place]) 07/04/2011  . Anxiety and depression 06/02/2011  . History of abnormal cervical Pap smear 12/13/2007  . GERD 12/06/2007  . Hyperlipidemia 08/29/2007  . Hypertension associated with diabetes ([place]) 08/29/2007   Past Medical History:  Diagnosis Date  . Abdominal muscle pain 09/08/2016  . Abnormal Pap smear of cervix 2009  . Abscess    history of multiple abscesses  . Acute bilateral low back pain 02/13/2017  . Acute blood loss anemia   . Anemia of chronic disease 2002  . Anxiety    Panic attacks  . Bilateral lower extremity edema 05/13/2016  . Bipolar disorder ([place])   . Cellulitis 05/21/2014   right eye  . Chronic bronchitis ([place])    "get it q yr" (05/13/2013)  . Chronic pain   . Depression   . Edema of lower extremity   . Endocarditis 2002   subacute bacterial endocarditis.   . Family history of anesthesia complication    "my mom has a hard time coming out from under"  . Fibromyalgia   . GERD (gastroesophageal reflux disease)    occ  . Heart murmur   . History of blood transfusion    "just low blood count" (05/13/2013)  . Hyperlipidemia   . Hypertension   . Hypothyroidism   . Hypothyroidism, adult 03/21/2014  . Leukocytosis   . Necrosis ([place])    and ulceration  . Obesity   . OSA on CPAP    does not wear CPAP  . Peripheral neuropathy   . Type II diabetes mellitus (H[facility])    Type  II    Family History  Problem Relation Age of Onset  . Heart failure Mother   . Diabetes Mother   . Kidney disease Mother   . Kidney disease Father   . Diabetes Father   . Diabetes Paternal Grandmother   .  Heart failure Paternal Grandmother   . Other Other     Past Surgical History:  Procedure Laterality Date  . AMPUTATION Left 04/07/2017   Procedure: LEFT BELOW KNEE AMPUTATION;  Surgeon: Newt Minion, MD;  Location: [facility];  Service: Orthopedics;  Laterality: Left;  . EYE SURGERY     lazer  . INCISION AND DRAINAGE ABSCESS     multiple I&Ds  . INCISION AND DRAINAGE ABSCESS Left 07/09/2012   Procedure: DRESSING CHANGE, THIGH WOUND;  Surgeon: Harl Bowie, MD;  Location: [facility];  Service: General;  Laterality: Left;  .  INCISION AND DRAINAGE OF WOUND Left 07/07/2012   Procedure: IRRIGATION AND DEBRIDEMENT WOUND;  Surgeon: Harl Bowie, MD;  Location: [facility];  Service: General;  Laterality: Left;  . INCISION AND DRAINAGE PERIRECTAL ABSCESS Left 07/14/2012   Procedure: DEBRIDEMENT OF SKIN & SOFT TISSUE; DRESSING CHANGE UNDER ANESTHESIA;  Surgeon: Gayland Curry, MD,FACS;  Location: [facility];  Service: General;  Laterality: Left;  . INCISION AND DRAINAGE PERIRECTAL ABSCESS Left 07/16/2012   Procedure: I&D Left Thigh;  Surgeon: Gwenyth Ober, MD;  Location: [facility];  Service: General;  Laterality: Left;  . INCISION AND DRAINAGE PERIRECTAL ABSCESS N/A 01/05/2015   Procedure: IRRIGATION AND DEBRIDEMENT PERIRECTAL ABSCESS;  Surgeon: Donnie Mesa, MD;  Location: [facility];  Service: General;  Laterality: N/A;  . IRRIGATION AND DEBRIDEMENT ABSCESS Left 07/06/2012   Procedure: IRRIGATION AND DEBRIDEMENT ABSCESS BUTTOCKS AND THIGH;  Surgeon: Shann Medal, MD;  Location: [facility];  Service: General;  Laterality: Left;  . IRRIGATION AND DEBRIDEMENT ABSCESS Left 08/10/2012   Procedure: IRRIGATION AND DEBRIDEMENT ABSCESS;  Surgeon: Madilyn Hook, DO;  Location: [facility];  Service: General;  Laterality: Left;  . STUMP REVISION Left 06/09/2017   Procedure: REVISION LEFT BELOW KNEE AMPUTATION;  Surgeon: Newt Minion, MD;  Location: [facility];  Service: Orthopedics;  Laterality: Left;  . TONSILLECTOMY  1994   Social History    Occupational History  . Occupation: disability  Tobacco Use  . Smoking status: Current Every Day Smoker    Packs/day: 0.50    Years: 15.00    Pack years: 7.50    Types: Cigarettes  . Smokeless tobacco: Never Used  . Tobacco comment: Cutting back.   Substance and Sexual Activity  . Alcohol use: Yes    Alcohol/week: 0.0 standard drinks    Comment: Socially-" monthy  maybe"  . Drug use: No  . Sexual activity: Yes

## 2018-01-12 ENCOUNTER — Encounter (HOSPITAL_COMMUNITY): Payer: Self-pay

## 2018-01-12 ENCOUNTER — Ambulatory Visit: Payer: Self-pay

## 2018-01-12 ENCOUNTER — Ambulatory Visit (HOSPITAL_COMMUNITY)
Admission: EM | Admit: 2018-01-12 | Discharge: 2018-01-12 | Disposition: A | Discharge status: Home / Self Care | Payer: Medicare Other | Attending: Internal Medicine | Admitting: Internal Medicine

## 2018-01-12 DIAGNOSIS — Z79891 Long term (current) use of opiate analgesic: Secondary | ICD-10-CM | POA: Diagnosis not present

## 2018-01-12 DIAGNOSIS — G4733 Obstructive sleep apnea (adult) (pediatric): Secondary | ICD-10-CM | POA: Insufficient documentation

## 2018-01-12 DIAGNOSIS — I5032 Chronic diastolic (congestive) heart failure: Secondary | ICD-10-CM | POA: Insufficient documentation

## 2018-01-12 DIAGNOSIS — M797 Fibromyalgia: Secondary | ICD-10-CM | POA: Diagnosis not present

## 2018-01-12 DIAGNOSIS — E1142 Type 2 diabetes mellitus with diabetic polyneuropathy: Secondary | ICD-10-CM | POA: Diagnosis not present

## 2018-01-12 DIAGNOSIS — Z8249 Family history of ischemic heart disease and other diseases of the circulatory system: Secondary | ICD-10-CM | POA: Insufficient documentation

## 2018-01-12 DIAGNOSIS — E785 Hyperlipidemia, unspecified: Secondary | ICD-10-CM | POA: Insufficient documentation

## 2018-01-12 DIAGNOSIS — E039 Hypothyroidism, unspecified: Secondary | ICD-10-CM | POA: Insufficient documentation

## 2018-01-12 DIAGNOSIS — F419 Anxiety disorder, unspecified: Secondary | ICD-10-CM | POA: Diagnosis not present

## 2018-01-12 DIAGNOSIS — A6000 Herpesviral infection of urogenital system, unspecified: Secondary | ICD-10-CM | POA: Diagnosis not present

## 2018-01-12 DIAGNOSIS — Z79899 Other long term (current) drug therapy: Secondary | ICD-10-CM | POA: Insufficient documentation

## 2018-01-12 DIAGNOSIS — Z841 Family history of disorders of kidney and ureter: Secondary | ICD-10-CM | POA: Diagnosis not present

## 2018-01-12 DIAGNOSIS — F17210 Nicotine dependence, cigarettes, uncomplicated: Secondary | ICD-10-CM | POA: Diagnosis not present

## 2018-01-12 DIAGNOSIS — K219 Gastro-esophageal reflux disease without esophagitis: Secondary | ICD-10-CM | POA: Diagnosis not present

## 2018-01-12 DIAGNOSIS — Z833 Family history of diabetes mellitus: Secondary | ICD-10-CM | POA: Diagnosis not present

## 2018-01-12 DIAGNOSIS — A6009 Herpesviral infection of other urogenital tract: Secondary | ICD-10-CM

## 2018-01-12 DIAGNOSIS — Z9989 Dependence on other enabling machines and devices: Secondary | ICD-10-CM | POA: Insufficient documentation

## 2018-01-12 DIAGNOSIS — E1122 Type 2 diabetes mellitus with diabetic chronic kidney disease: Secondary | ICD-10-CM | POA: Insufficient documentation

## 2018-01-12 DIAGNOSIS — Z794 Long term (current) use of insulin: Secondary | ICD-10-CM | POA: Diagnosis not present

## 2018-01-12 DIAGNOSIS — I130 Hypertensive heart and chronic kidney disease with heart failure and stage 1 through stage 4 chronic kidney disease, or unspecified chronic kidney disease: Secondary | ICD-10-CM | POA: Diagnosis not present

## 2018-01-12 DIAGNOSIS — Z881 Allergy status to other antibiotic agents status: Secondary | ICD-10-CM | POA: Insufficient documentation

## 2018-01-12 DIAGNOSIS — Z9889 Other specified postprocedural states: Secondary | ICD-10-CM | POA: Diagnosis not present

## 2018-01-12 DIAGNOSIS — Z89512 Acquired absence of left leg below knee: Secondary | ICD-10-CM | POA: Insufficient documentation

## 2018-01-12 DIAGNOSIS — F319 Bipolar disorder, unspecified: Secondary | ICD-10-CM | POA: Diagnosis not present

## 2018-01-12 DIAGNOSIS — G8929 Other chronic pain: Secondary | ICD-10-CM | POA: Insufficient documentation

## 2018-01-12 MED ORDER — FAMCICLOVIR 500 MG PO TABS: 500.0000 mg | ORAL_TABLET | Freq: Three times a day (TID) | ORAL | 1 refills | Status: AC

## 2018-01-12 NOTE — ED Provider Notes (Signed)
[employer]    CSN: [id number] Arrival date & time: 01/12/18  1502     History   Chief Complaint Chief Complaint  Patient presents with  . Vaginal Itching    HPI [name] [name] is a 42 y.o. female.   She is a poorly controlled diabetic and had a left BKA about 10 months ago.  She presents today with a one-week history of irritation of her bottom, groin and skin folds from the inguinal crease back to her perianal area.  She thought maybe she had a yeast infection because there was some white discharge, but Monistat 7 did not help.  Skin folds have become very painful, causing her to have to sit differently in her wheelchair, she is very uncomfortable.  No dysuria, except when urine crosses her skin folds.  No urinary frequency.  No vaginal bleeding, but she does think there has been a little spot bleeding in her skin folds.  Pain when she wipes after a bowel movement.  No abdominal or pelvic pain.  No fever or chills.  No nausea/vomiting.  She has not been sexually active in 10 months, since before her left leg amputation. She does have history of fever blisters, typically in her nose and extending down to the lip.    HPI  Past Medical History:  Diagnosis Date  . Abdominal muscle pain 09/08/2016  . Abnormal Pap smear of cervix 2009  . Abscess    history of multiple abscesses  . Acute bilateral low back pain 02/13/2017  . Acute blood loss anemia   . Anemia of chronic disease 2002  . Anxiety    Panic attacks  . Bilateral lower extremity edema 05/13/2016  . Bipolar disorder ([place])   . Cellulitis 05/21/2014   right eye  . Chronic bronchitis ([place])    "get it q yr" (05/13/2013)  . Chronic pain   . Depression   . Edema of lower extremity   . Endocarditis 2002   subacute bacterial endocarditis.   . Family history of anesthesia complication    "my mom has a hard time coming out from under"  . Fibromyalgia   . GERD (gastroesophageal reflux disease)    occ  .  Heart murmur   . History of blood transfusion    "just low blood count" (05/13/2013)  . Hyperlipidemia   . Hypertension   . Hypothyroidism   . Hypothyroidism, adult 03/21/2014  . Leukocytosis   . Necrosis ([place])    and ulceration  . Obesity   . OSA on CPAP    does not wear CPAP  . Peripheral neuropathy   . Type II diabetes mellitus ([place])    Type  II    Patient Active Problem List   Diagnosis Date Noted  . Dehiscence of amputation stump ([place])   . Status post below knee amputation, left ([place]) 04/11/2017  . Bipolar affective disorder ([place])   . Adjustment disorder with depressed mood 03/26/2017  . Abnormal ECG 03/24/2017  . Heart failure with preserved ejection fraction ([place]) 02/09/2017  . Routine adult health maintenance 12/08/2016  . Chronic pain of right knee 09/08/2016  . CKD (chronic kidney disease) stage 2, GFR 60-89 ml/min 07/15/2016  . OSA (obstructive sleep apnea) 05/13/2016  . Morbid obesity due to excess calories ([place]) 11/27/2015  . History of Low serum cortisol level ([place]) 01/07/2015  . Hypothyroidism 04/25/2013  . Financial difficulty 04/25/2013  . Uncontrolled type 2 diabetes mellitus ([place]) 07/12/2012  . Necrotizing fasciitis s/p  OR debridements 07/06/2012  . Carpal tunnel syndrome, bilateral 01/25/2012  . Diabetic peripheral neuropathy ([place]) 07/04/2011  . Anxiety and depression 06/02/2011  . History of abnormal cervical Pap smear 12/13/2007  . GERD 12/06/2007  . Hyperlipidemia 08/29/2007  . Hypertension associated with diabetes ([place]) 08/29/2007    Past Surgical History:  Procedure Laterality Date  . AMPUTATION Left 04/07/2017   Procedure: LEFT BELOW KNEE AMPUTATION;  Surgeon: Newt Minion, MD;  Location: [facility];  Service: Orthopedics;  Laterality: Left;  . EYE SURGERY     lazer  . INCISION AND DRAINAGE ABSCESS     multiple I&Ds  . INCISION AND DRAINAGE ABSCESS Left 07/09/2012   Procedure: DRESSING CHANGE, THIGH WOUND;  Surgeon: Harl Bowie, MD;   Location: [facility];  Service: General;  Laterality: Left;  . INCISION AND DRAINAGE OF WOUND Left 07/07/2012   Procedure: IRRIGATION AND DEBRIDEMENT WOUND;  Surgeon: Harl Bowie, MD;  Location: [facility];  Service: General;  Laterality: Left;  . INCISION AND DRAINAGE PERIRECTAL ABSCESS Left 07/14/2012   Procedure: DEBRIDEMENT OF SKIN & SOFT TISSUE; DRESSING CHANGE UNDER ANESTHESIA;  Surgeon: Gayland Curry, MD,FACS;  Location: [facility];  Service: General;  Laterality: Left;  . INCISION AND DRAINAGE PERIRECTAL ABSCESS Left 07/16/2012   Procedure: I&D Left Thigh;  Surgeon: Gwenyth Ober, MD;  Location: [facility];  Service: General;  Laterality: Left;  . INCISION AND DRAINAGE PERIRECTAL ABSCESS N/A 01/05/2015   Procedure: IRRIGATION AND DEBRIDEMENT PERIRECTAL ABSCESS;  Surgeon: Donnie Mesa, MD;  Location: [facility];  Service: General;  Laterality: N/A;  . IRRIGATION AND DEBRIDEMENT ABSCESS Left 07/06/2012   Procedure: IRRIGATION AND DEBRIDEMENT ABSCESS BUTTOCKS AND THIGH;  Surgeon: Shann Medal, MD;  Location: [facility];  Service: General;  Laterality: Left;  . IRRIGATION AND DEBRIDEMENT ABSCESS Left 08/10/2012   Procedure: IRRIGATION AND DEBRIDEMENT ABSCESS;  Surgeon: Madilyn Hook, DO;  Location: [facility];  Service: General;  Laterality: Left;  . STUMP REVISION Left 06/09/2017   Procedure: REVISION LEFT BELOW KNEE AMPUTATION;  Surgeon: Newt Minion, MD;  Location: [facility];  Service: Orthopedics;  Laterality: Left;  . TONSILLECTOMY  1994     Home Medications    Prior to Admission medications   Medication Sig Start Date End Date Taking? Authorizing Provider  amitriptyline (ELAVIL) 50 MG tablet TAKE 1 TABLET BY MOUTH AT BEDTIME 10/31/17   Oda Kilts, MD  Ascorbic Acid (VITAMIN C) 1000 MG tablet Take 1,000 mg by mouth daily.    [provider]  atorvastatin (LIPITOR) 20 MG tablet Take 1 tablet (20 mg total) by mouth daily. 04/25/17   Angiulli, Lavon Paganini, PA-C  bumetanide (BUMEX) 1 MG tablet Take 1 tablet  (1 mg total) by mouth 2 (two) times daily. 08/28/17   Oval Linsey, MD  cyclobenzaprine (FLEXERIL) 10 MG tablet TAKE 1 TABLET BY MOUTH EVERY TWELVE HOURS AS NEEDED for MUSCLE SPASMS 12/26/17   Ina Homes, MD  diclofenac sodium (VOLTAREN) 1 % GEL Apply 4 grams to affected area 4 times daily as needed for pain 12/11/17   Sid Falcon, MD  docusate sodium (COLACE) 100 MG capsule Take 1 capsule (100 mg total) by mouth 2 (two) times daily. Patient taking differently: Take 100 mg by mouth 2 (two) times daily as needed for moderate constipation.  04/25/17   Angiulli, Lavon Paganini, PA-C  famciclovir (FAMVIR) 500 MG tablet Take 1 tablet (500 mg total) by mouth 3 (three) times daily for 10 days. 01/12/18  01/22/18  Wynona Luna, MD  FLUoxetine (PROZAC) 20 MG/5ML solution Take 15 mLs (60 mg total) by mouth daily. 01/03/18   Molt, Bethany, DO  insulin aspart (NOVOLOG FLEXPEN) 100 UNIT/ML FlexPen Inject 13 Units into the skin 3 (three) times daily with meals. 09/07/17   Colbert Ewing, MD  insulin glargine (LANTUS) 100 unit/mL SOPN Inject 0.5 mLs (50 Units total) into the skin 2 (two) times daily. 09/07/17   Colbert Ewing, MD  LYRICA 100 MG capsule TAKE 1 CAPSULE BY MOUTH 3 TIMES DAILY 01/02/18   Ina Homes, MD  Multiple Vitamins-Calcium (ONE-A-DAY WOMENS PO) Take 1 tablet by mouth daily.    [provider]  mupirocin ointment (BACTROBAN) 2 % Apply 1 application topically 2 (two) times daily. 06/28/17   Suzan Slick, NP  oxyCODONE-acetaminophen (PERCOCET/ROXICET) 5-325 MG tablet Take 1 tablet by mouth every 8 (eight) hours as needed. 12/11/17   Newt Minion, MD  oxyCODONE-acetaminophen (PERCOCET/ROXICET) 5-325 MG tablet Take 1 tablet by mouth every 4 (four) hours as needed for severe pain. 01/08/18   Newt Minion, MD  polyethylene glycol [facility] / Floria Raveling) packet Take 17 g by mouth daily. Patient taking differently: Take 17 g by mouth daily as needed for moderate constipation.  04/25/17    Angiulli, Lavon Paganini, PA-C  VICTOZA 18 MG/3ML SOPN Inject 0.3 mLs (1.8 mg total) into the skin daily. 10/31/17   Oda Kilts, MD    Family History Family History  Problem Relation Age of Onset  . Heart failure Mother   . Diabetes Mother   . Kidney disease Mother   . Kidney disease Father   . Diabetes Father   . Diabetes Paternal Grandmother   . Heart failure Paternal Grandmother   . Other Other     Social History Social History   Tobacco Use  . Smoking status: Current Every Day Smoker    Packs/day: 0.50    Years: 15.00    Pack years: 7.50    Types: Cigarettes  . Smokeless tobacco: Never Used  . Tobacco comment: Cutting back.   Substance Use Topics  . Alcohol use: Yes    Alcohol/week: 0.0 standard drinks    Comment: Socially-" monthy  maybe"  . Drug use: No     Allergies   Vancomycin   Review of Systems Review of Systems  All other systems reviewed and are negative.    Physical Exam Triage Vital Signs ED Triage Vitals [01/12/18 1527]  Enc Vitals Group     BP (!) 150/86     Pulse Rate (!) 111     Resp 18     Temp 98.1 F (36.7 C)     Temp src      SpO2 97 %     Weight      Height      Pain Score 3     Pain Loc    Updated Vital Signs BP (!) 150/86   Pulse (!) 111   Temp 98.1 F (36.7 C)   Resp 18   SpO2 97%   Physical Exam  Constitutional: She is oriented to person, place, and time.  Sitting in taxi chair Leaning on her right side, looks uncomfortable  HENT:  Head: Atraumatic.  Eyes:  Conjugate gaze observed, no eye redness/discharge  Neck: Neck supple.  Cardiovascular: Normal rate.  Pulmonary/Chest: No respiratory distress.  Abdominal: She exhibits no distension.  Genitourinary:  Genitourinary Comments: External exam of the groins and perineum notable for extensive  superficial and painful erosions, with modest amount of thin white discharge.  Several of the eroded areas were swabbed for herpes culture.  Musculoskeletal: Normal  range of motion.  Patient was able to help transfer to the exam table for exam  Neurological: She is alert and oriented to person, place, and time.  Skin: Skin is warm and dry.  Nursing note and vitals reviewed.   Final Clinical Impressions(s) / UC Diagnoses   Final diagnoses:  Herpes genitalis in women     Discharge Instructions     Exam today showed widespread painful raw patches with discharge, consistent with herpes virus.  Some of the raw patches were swabbed to test for herpes virus to confirm the diagnosis.  Prescription for famciclovir (an anti virus medicine) was sent to the pharmacy.  Take all of the medicine.  Anticipate gradual improvement in discharge and pain over the next several days. Recheck or followup with your primary care provider or [facility] to discuss next steps if not improving as expected.  Keep followups with your rehab doctor and Dr Sharol Given as scheduled.   ED Prescriptions    Medication Sig Dispense Auth. Provider   famciclovir (FAMVIR) 500 MG tablet Take 1 tablet (500 mg total) by mouth 3 (three) times daily for 10 days. 30 tablet Wynona Luna, MD        Wynona Luna, MD 01/13/18 [phone]

## 2018-01-12 NOTE — ED Triage Notes (Signed)
Pt presents with complaints of vaginal irritation and pain in her stump at her amputation site on her left leg.

## 2018-01-12 NOTE — Discharge Instructions (Addendum)
Exam today showed widespread painful raw patches with discharge, consistent with herpes virus.  Some of the raw patches were swabbed to test for herpes virus to confirm the diagnosis.  Prescription for famciclovir (an anti virus medicine) was sent to the pharmacy.  Take all of the medicine.  Anticipate gradual improvement in discharge and pain over the next several days. Recheck or followup with your primary care provider or [facility] to discuss next steps if not improving as expected.  Keep followups with your rehab doctor and Dr Sharol Given as scheduled.

## 2018-01-13 ENCOUNTER — Encounter (HOSPITAL_COMMUNITY): Payer: Self-pay | Admitting: *Deleted

## 2018-01-13 ENCOUNTER — Ambulatory Visit (HOSPITAL_COMMUNITY)
Admission: EM | Admit: 2018-01-13 | Discharge: 2018-01-13 | Disposition: A | Discharge status: Home / Self Care | Payer: Medicare Other | Attending: Internal Medicine | Admitting: Internal Medicine

## 2018-01-13 DIAGNOSIS — R102 Pelvic and perineal pain: Secondary | ICD-10-CM | POA: Diagnosis not present

## 2018-01-13 DIAGNOSIS — A6009 Herpesviral infection of other urogenital tract: Secondary | ICD-10-CM | POA: Diagnosis not present

## 2018-01-13 DIAGNOSIS — Z760 Encounter for issue of repeat prescription: Secondary | ICD-10-CM

## 2018-01-13 NOTE — ED Provider Notes (Signed)
[place]    CSN: [id number] Arrival date & time: 01/13/18  1206     History   Chief Complaint Chief Complaint  Patient presents with  . Appointment    1200  . Follow-up    HPI [name] [name] is a 42 y.o. female.   42 y.o. female presents with pain related to what she states is her newly diagnosis HSV. Patient states that she was heading to the pharmacy to pick up her prescriotion when she decided to come her for pain medication. Patient has had 2 recent prescriptions for hydrocodone  And states that she has a couple of pills left from her 8.19.19 prescription. Patient education provided on HSV and treatment. pateint is in agreement and will pick up prescription. .     Past Medical History:  Diagnosis Date  . Abdominal muscle pain 09/08/2016  . Abnormal Pap smear of cervix 2009  . Abscess    history of multiple abscesses  . Acute bilateral low back pain 02/13/2017  . Acute blood loss anemia   . Anemia of chronic disease 2002  . Anxiety    Panic attacks  . Bilateral lower extremity edema 05/13/2016  . Bipolar disorder ([place])   . Cellulitis 05/21/2014   right eye  . Chronic bronchitis ([place])    "get it q yr" (05/13/2013)  . Chronic pain   . Depression   . Edema of lower extremity   . Endocarditis 2002   subacute bacterial endocarditis.   . Family history of anesthesia complication    "my mom has a hard time coming out from under"  . Fibromyalgia   . GERD (gastroesophageal reflux disease)    occ  . Heart murmur   . History of blood transfusion    "just low blood count" (05/13/2013)  . Hyperlipidemia   . Hypertension   . Hypothyroidism   . Hypothyroidism, adult 03/21/2014  . Leukocytosis   . Necrosis ([place])    and ulceration  . Obesity   . OSA on CPAP    does not wear CPAP  . Peripheral neuropathy   . Type II diabetes mellitus ([place])    Type  II    Patient Active Problem List   Diagnosis Date Noted  . Dehiscence of amputation stump  ([place])   . Status post below knee amputation, left ([place]) 04/11/2017  . Bipolar affective disorder ([place])   . Adjustment disorder with depressed mood 03/26/2017  . Abnormal ECG 03/24/2017  . Heart failure with preserved ejection fraction ([place]) 02/09/2017  . Routine adult health maintenance 12/08/2016  . Chronic pain of right knee 09/08/2016  . CKD (chronic kidney disease) stage 2, GFR 60-89 ml/min 07/15/2016  . OSA (obstructive sleep apnea) 05/13/2016  . Morbid obesity due to excess calories ([place]) 11/27/2015  . History of Low serum cortisol level ([place]) 01/07/2015  . Hypothyroidism 04/25/2013  . Financial difficulty 04/25/2013  . Uncontrolled type 2 diabetes mellitus ([place]) 07/12/2012  . Necrotizing fasciitis s/p OR debridements 07/06/2012  . Carpal tunnel syndrome, bilateral 01/25/2012  . Diabetic peripheral neuropathy ([place]) 07/04/2011  . Anxiety and depression 06/02/2011  . History of abnormal cervical Pap smear 12/13/2007  . GERD 12/06/2007  . Hyperlipidemia 08/29/2007  . Hypertension associated with diabetes ([place]) 08/29/2007    Past Surgical History:  Procedure Laterality Date  . AMPUTATION Left 04/07/2017   Procedure: LEFT BELOW KNEE AMPUTATION;  Surgeon: Newt Minion, MD;  Location: [place];  Service: Orthopedics;  Laterality: Left;  .  EYE SURGERY     lazer  . INCISION AND DRAINAGE ABSCESS     multiple I&Ds  . INCISION AND DRAINAGE ABSCESS Left 07/09/2012   Procedure: DRESSING CHANGE, THIGH WOUND;  Surgeon: Harl Bowie, MD;  Location: [facility];  Service: General;  Laterality: Left;  . INCISION AND DRAINAGE OF WOUND Left 07/07/2012   Procedure: IRRIGATION AND DEBRIDEMENT WOUND;  Surgeon: Harl Bowie, MD;  Location: [facility];  Service: General;  Laterality: Left;  . INCISION AND DRAINAGE PERIRECTAL ABSCESS Left 07/14/2012   Procedure: DEBRIDEMENT OF SKIN & SOFT TISSUE; DRESSING CHANGE UNDER ANESTHESIA;  Surgeon: Gayland Curry, MD,FACS;  Location: [facility];  Service: General;   Laterality: Left;  . INCISION AND DRAINAGE PERIRECTAL ABSCESS Left 07/16/2012   Procedure: I&D Left Thigh;  Surgeon: Gwenyth Ober, MD;  Location: [facility];  Service: General;  Laterality: Left;  . INCISION AND DRAINAGE PERIRECTAL ABSCESS N/A 01/05/2015   Procedure: IRRIGATION AND DEBRIDEMENT PERIRECTAL ABSCESS;  Surgeon: Donnie Mesa, MD;  Location: [facility];  Service: General;  Laterality: N/A;  . IRRIGATION AND DEBRIDEMENT ABSCESS Left 07/06/2012   Procedure: IRRIGATION AND DEBRIDEMENT ABSCESS BUTTOCKS AND THIGH;  Surgeon: Shann Medal, MD;  Location: [facility];  Service: General;  Laterality: Left;  . IRRIGATION AND DEBRIDEMENT ABSCESS Left 08/10/2012   Procedure: IRRIGATION AND DEBRIDEMENT ABSCESS;  Surgeon: Madilyn Hook, DO;  Location: [facility];  Service: General;  Laterality: Left;  . STUMP REVISION Left 06/09/2017   Procedure: REVISION LEFT BELOW KNEE AMPUTATION;  Surgeon: Newt Minion, MD;  Location: [facility];  Service: Orthopedics;  Laterality: Left;  . TONSILLECTOMY  1994    OB History   None      Home Medications    Prior to Admission medications   Medication Sig Start Date End Date Taking? Authorizing Provider  amitriptyline (ELAVIL) 50 MG tablet TAKE 1 TABLET BY MOUTH AT BEDTIME 10/31/17  Yes Oda Kilts, MD  Ascorbic Acid (VITAMIN C) 1000 MG tablet Take 1,000 mg by mouth daily.   Yes [provider]  atorvastatin (LIPITOR) 20 MG tablet Take 1 tablet (20 mg total) by mouth daily. 04/25/17  Yes Angiulli, Lavon Paganini, PA-C  bumetanide (BUMEX) 1 MG tablet Take 1 tablet (1 mg total) by mouth 2 (two) times daily. 08/28/17  Yes Oval Linsey, MD  cyclobenzaprine (FLEXERIL) 10 MG tablet TAKE 1 TABLET BY MOUTH EVERY TWELVE HOURS AS NEEDED for MUSCLE SPASMS 12/26/17  Yes Helberg, Larkin Ina, MD  diclofenac sodium (VOLTAREN) 1 % GEL Apply 4 grams to affected area 4 times daily as needed for pain 12/11/17  Yes Sid Falcon, MD  docusate sodium (COLACE) 100 MG capsule Take 1 capsule (100 mg  total) by mouth 2 (two) times daily. Patient taking differently: Take 100 mg by mouth 2 (two) times daily as needed for moderate constipation.  04/25/17  Yes Angiulli, Lavon Paganini, PA-C  FLUoxetine (PROZAC) 20 MG/5ML solution Take 15 mLs (60 mg total) by mouth daily. 01/03/18  Yes Molt, Bethany, DO  insulin aspart (NOVOLOG FLEXPEN) 100 UNIT/ML FlexPen Inject 13 Units into the skin 3 (three) times daily with meals. 09/07/17  Yes Colbert Ewing, MD  insulin glargine (LANTUS) 100 unit/mL SOPN Inject 0.5 mLs (50 Units total) into the skin 2 (two) times daily. 09/07/17  Yes Colbert Ewing, MD  LYRICA 100 MG capsule TAKE 1 CAPSULE BY MOUTH 3 TIMES DAILY 01/02/18  Yes Ina Homes, MD  Multiple Vitamins-Calcium (ONE-A-DAY WOMENS PO) Take 1 tablet  by mouth daily.   Yes [provider]  mupirocin ointment (BACTROBAN) 2 % Apply 1 application topically 2 (two) times daily. 06/28/17  Yes Dondra Prader R, NP  oxyCODONE-acetaminophen (PERCOCET/ROXICET) 5-325 MG tablet Take 1 tablet by mouth every 4 (four) hours as needed for severe pain. 01/08/18  Yes Newt Minion, MD  VICTOZA 18 MG/3ML SOPN Inject 0.3 mLs (1.8 mg total) into the skin daily. 10/31/17  Yes Oda Kilts, MD  famciclovir (FAMVIR) 500 MG tablet Take 1 tablet (500 mg total) by mouth 3 (three) times daily for 10 days. 01/12/18 01/22/18  Wynona Luna, MD  polyethylene glycol [facility] / Floria Raveling) packet Take 17 g by mouth daily. Patient taking differently: Take 17 g by mouth daily as needed for moderate constipation.  04/25/17   Angiulli, Lavon Paganini, PA-C    Family History Family History  Problem Relation Age of Onset  . Heart failure Mother   . Diabetes Mother   . Kidney disease Mother   . Kidney disease Father   . Diabetes Father   . Diabetes Paternal Grandmother   . Heart failure Paternal Grandmother   . Other Other     Social History Social History   Tobacco Use  . Smoking status: Current Every Day Smoker    Packs/day:  0.50    Years: 15.00    Pack years: 7.50    Types: Cigarettes  . Smokeless tobacco: Never Used  . Tobacco comment: Cutting back.   Substance Use Topics  . Alcohol use: Yes    Alcohol/week: 0.0 standard drinks    Comment: Socially-" monthy  maybe"  . Drug use: No     Allergies   Vancomycin   Review of Systems Review of Systems  Constitutional: Negative for chills and fever.  HENT: Negative for ear pain and sore throat.   Eyes: Negative for pain and visual disturbance.  Respiratory: Negative for cough and shortness of breath.   Cardiovascular: Negative for chest pain and palpitations.  Gastrointestinal: Negative for abdominal pain and vomiting.  Genitourinary: Negative for dysuria and hematuria.  Musculoskeletal: Negative for arthralgias and back pain.  Skin: Negative for color change and rash.  Neurological: Negative for seizures and syncope.  All other systems reviewed and are negative.    Physical Exam Triage Vital Signs ED Triage Vitals  Enc Vitals Group     BP 01/13/18 1221 138/90     Pulse Rate 01/13/18 1221 (!) 114     Resp 01/13/18 1221 18     Temp 01/13/18 1221 98.3 F (36.8 C)     Temp Source 01/13/18 1221 Oral     SpO2 01/13/18 1221 98 %     Weight --      Height --      Head Circumference --      Peak Flow --      Pain Score 01/13/18 1222 9     Pain Loc --      Pain Edu? --      Excl. in [place]? --    No data found.  Updated Vital Signs BP 138/90   Pulse (!) 114   Temp 98.3 F (36.8 C) (Oral)   Resp 18   LMP 12/19/2017 (Approximate)   SpO2 98%   Visual Acuity Right Eye Distance:   Left Eye Distance:   Bilateral Distance:    Right Eye Near:   Left Eye Near:    Bilateral Near:     Physical Exam  Constitutional:  She is oriented to person, place, and time. She appears well-developed and well-nourished.  HENT:  Head: Normocephalic and atraumatic.  Eyes: Conjunctivae are normal.  Neck: Normal range of motion.  Pulmonary/Chest: Effort  normal.  Musculoskeletal: Normal range of motion.  Neurological: She is alert and oriented to person, place, and time.  Skin: Skin is warm.  Psychiatric: She has a normal mood and affect.  Nursing note and vitals reviewed.    UC Treatments / Results  Labs (all labs ordered are listed, but only abnormal results are displayed) Labs Reviewed - No data to display  EKG None  Radiology No results found.  Procedures Procedures (including critical care time)  Medications Ordered in UC Medications - No data to display  Initial Impression / Assessment and Plan / UC Course  I have reviewed the triage vital signs and the nursing notes.  Pertinent labs & imaging results that were available during my care of the patient were reviewed by me and considered in my medical decision making (see chart for details).      Final Clinical Impressions(s) / UC Diagnoses   Final diagnoses:  None   Discharge Instructions   None    ED Prescriptions    None     Controlled Substance Prescriptions [place] Controlled Substance Registry consulted? Not Applicable   Jacqualine Mau, NP 01/13/18 1258

## 2018-01-13 NOTE — Discharge Instructions (Addendum)
Please pick up prescription

## 2018-01-13 NOTE — ED Triage Notes (Addendum)
Pt was seen in [facility] yesterday for genital herpes outbreak.  Pt presents today stating she needs pain meds; has been unable to sleep due to pain. Upon review of pt's meds, questioned pt if she is taking the oxycodone that she was prescribed 8/19 - states "I have very few pills left".

## 2018-01-15 ENCOUNTER — Telehealth (HOSPITAL_COMMUNITY): Payer: Self-pay

## 2018-01-15 LAB — HSV CULTURE AND TYPING

## 2018-01-15 NOTE — Telephone Encounter (Signed)
Herpes screening is positive for HSV 1, attempted to reach patient. No answer at this time. No voicemail available.

## 2018-01-16 ENCOUNTER — Other Ambulatory Visit: Payer: Self-pay

## 2018-01-16 ENCOUNTER — Encounter: Payer: Self-pay | Admitting: Internal Medicine

## 2018-01-16 ENCOUNTER — Ambulatory Visit (INDEPENDENT_AMBULATORY_CARE_PROVIDER_SITE_OTHER): Payer: Medicare Other | Admitting: Internal Medicine

## 2018-01-16 VITALS — BP 143/75 | HR 101 | Temp 98.1°F | Ht 70.0 in

## 2018-01-16 DIAGNOSIS — Z79891 Long term (current) use of opiate analgesic: Secondary | ICD-10-CM

## 2018-01-16 DIAGNOSIS — R51 Headache: Secondary | ICD-10-CM | POA: Diagnosis not present

## 2018-01-16 DIAGNOSIS — G479 Sleep disorder, unspecified: Secondary | ICD-10-CM

## 2018-01-16 DIAGNOSIS — Z114 Encounter for screening for human immunodeficiency virus [HIV]: Secondary | ICD-10-CM

## 2018-01-16 DIAGNOSIS — Z7251 High risk heterosexual behavior: Secondary | ICD-10-CM

## 2018-01-16 DIAGNOSIS — B009 Herpesviral infection, unspecified: Secondary | ICD-10-CM | POA: Diagnosis not present

## 2018-01-16 HISTORY — DX: Herpesviral infection, unspecified: B00.9

## 2018-01-16 MED ORDER — OXYCODONE-ACETAMINOPHEN 5-325 MG PO TABS: 1.0000 | ORAL_TABLET | ORAL | 0 refills | Status: DC | PRN

## 2018-01-16 NOTE — Patient Instructions (Addendum)
It was a pleasure to see you today [name] [name]. I am sorry to hear about your pain.   -Please continue taking famciclovir 500 mg 3 times daily.  Pleat your course of therapy -Prescribed Percocet 5-325 four your pain, use this every 4 hours as needed -Refrain from sexual activity until your pain has resolved  If you have any questions or concerns, please call our clinic at [phone] between 9am-5pm and after hours call [phone] and ask for the internal medicine resident on call. If you feel you are having a medical emergency please call 911.   Thank you, we look forward to help you remain healthy!  Lars Mage, MD Internal Medicine PGY2  Herpes Simplex Test There are two common types of herpes simplex virus (HSV). These are classified as Type 1 (HSV1) or Type 2 (HSV2). Type 1 often causes cold sores on or around the mouth and sometimes on or around the eyes. Type 2 is commonly known as a sexually transmitted infection that causes sores in and around the genitals. Both types of herpes simplex can cause sores in different areas. There are two types of herpes simplex tests. These include:  Culture. This consists of collecting and testing a sample of fluid with a cotton swab from an open sore. This can only be done during an active infection (outbreak). Culture tests take several days to complete but are very accurate.  HSV blood tests. This test is not as accurate as a culture. However, HSV blood tests are faster than cultures, often providing a test result within one day. ? HSV antibody test. This checks for the presence of antibodies against HSV in your blood. Antibodies are proteins your body makes to help fight infection. ? HSV antigen test. This checks for the presence of the HSV germ (antigen) in your blood.  Your health care provider may recommend you have a HSV test if:  He or she believes you have a HSV infection.  You have a weakened immune system (immunocompromised) and you  have sores around your mouth or genitals that look like HSV eruptions.  You have a fever of unknown origin (FUO).  You are pregnant, have herpes, and are expecting to deliver a baby vaginally in the next 6-8 weeks.  What do the results mean? It is your responsibility to obtain your test results. Ask the lab or department performing the test when and how you will get your results. Contact your health care provider to discuss any questions you have about your results. Range of Normal Values Ranges for normal values may vary among different labs and hospitals. You should always check with your health care provider after having lab work or other tests done to discuss whether your values are considered within normal limits. Normal findings include:  No HSV antigen or antibodies present in your blood.  No HSV antigen present in cultured fluid.  Meaning of Results Outside Normal Ranges The following test results may indicate that you have an active HSV infection:  Positive culture for HSV1 or HSV2.  Presence of HSV1 or HSV2 antigens in your blood.  Presence of certain HSV1 or HSV2 antibodies (IgM) in your blood.  Discuss your test results with your health care provider. He or she will use the results to make a diagnosis and determine a treatment plan that is right for you. Talk with your health care provider to discuss your results, treatment options, and if necessary, the need for more tests. Talk with your health care  provider if you have any questions about your results. This information is not intended to replace advice given to you by your health care provider. Make sure you discuss any questions you have with your health care provider. Document Released: 06/11/2004 Document Revised: 01/13/2016 Document Reviewed: 09/24/2013 Elsevier Interactive Patient Education  2018 [employer].

## 2018-01-16 NOTE — Progress Notes (Signed)
   CC: Follow up for Herpes Genitalis  HPI:  [name] is a 42 y.o. with past medical history documented below presents for herpes genitalis. Please see problem based charting for evaluation, assessment, and plan.  Past Medical History:  Diagnosis Date  . Abdominal muscle pain 09/08/2016  . Abnormal Pap smear of cervix 2009  . Abscess    history of multiple abscesses  . Acute bilateral low back pain 02/13/2017  . Acute blood loss anemia   . Anemia of chronic disease 2002  . Anxiety    Panic attacks  . Bilateral lower extremity edema 05/13/2016  . Bipolar disorder ([place])   . Cellulitis 05/21/2014   right eye  . Chronic bronchitis ([place])    "get it q yr" (05/13/2013)  . Chronic pain   . Depression   . Edema of lower extremity   . Endocarditis 2002   subacute bacterial endocarditis.   . Family history of anesthesia complication    "my mom has a hard time coming out from under"  . Fibromyalgia   . GERD (gastroesophageal reflux disease)    occ  . Heart murmur   . History of blood transfusion    "just low blood count" (05/13/2013)  . Hyperlipidemia   . Hypertension   . Hypothyroidism   . Hypothyroidism, adult 03/21/2014  . Leukocytosis   . Necrosis ([place])    and ulceration  . Obesity   . OSA on CPAP    does not wear CPAP  . Peripheral neuropathy   . Type II diabetes mellitus ([facility])    Type  II   Review of Systems:    Has headaches, decreased sleep  Denies nausea, vomiting, abdominal pain, sob, chest pain  Physical Exam:  Vitals:   01/16/18 1050  BP: (!) 143/75  Pulse: (!) 101  Temp: 98.1 F (36.7 C)  TempSrc: Oral  SpO2: 100%  Height: 5\' 10"  (1.778 m)   Physical Exam  Constitutional: She appears well-developed and well-nourished. No distress.  HENT:  Head: Normocephalic and atraumatic.  Eyes: Conjunctivae are normal.  Cardiovascular: Normal rate, regular rhythm and normal heart sounds.  Respiratory: Effort normal and breath sounds normal.  No respiratory distress. She has no wheezes. No breast swelling or tenderness.  GI: Soft. Bowel sounds are normal. She exhibits no distension. There is no tenderness.  Genitourinary: There is tenderness and lesion on the right labia. There is tenderness and lesion on the left labia.  Genitourinary Comments: Tenderness with opening of external genitalia.  There are 1 to 2 cm in size linear abrasions noted on erythematous base  Musculoskeletal: She exhibits no edema.  Neurological: She is alert.  Skin: She is not diaphoretic.  Psychiatric: She has a normal mood and affect. Her behavior is normal. Judgment and thought content normal.   Assessment & Plan:   See Encounters Tab for problem based charting.  Patient discussed with Dr. Rebeca Alert

## 2018-01-16 NOTE — Assessment & Plan Note (Signed)
The patient states that she has been having extreme pain in her vaginal area for the past week. The pain was so severe that she was having difficulty sitting on the toilet seat, washing herself, or putting on underwear.  She does not know if she has any rashes or vesicles. Denies any vaginal bleeding or spotting. She notes accompanying thick white discharge and has had burning while urinating. The patient thought that she might have a yeast infection so she used monistat which did not help.  She was subsequently seen in the ED 01/12/18 and thought to have herpes genitalis. Her HSV typing and culture returned positive for HSV1. She was started on famciclovir 500mg  tid for 10 days which she has been using regularly. She returned to the ED on 01/13/18 for pain relief and was counseled to use her famciclovir.  The patient does not have any history of herpes. She has had one sexual partner in the past year and they do not use protection regularly.  States that their last sexual intercourse was 10 months ago.  She does not partake in any oral, anal or other forms of sexual activity.  Assessment and plan Today the patient returns to clinic stating that she is in severe pain and would like something for pain relief.   Examination of the patient's genitalia revealed 1-2cm in size linear superficial lesions that are erythematous and consistent with ulcers on an erythematous base.   She states that she has one last tablet of Percocet 5-325 g that was prescribed on 01/08/2018.  Reviewed [facility] controlled substance database and the last time prescription was filled for opioid medication was on 8/19.  Patient has used the pain medications appropriately considering her acute herpetic pain.  -Patient encouraged to continue treatment with famciclovir for 10-day course -Counseled patient on avoiding sexual activity until her lesions resolve -Recommend cussing options for long-term suppressive therapy for HSV  with the patient at subsequent visit -Prescribed patient Percocet 5-325 mg every 4 hours as needed #20 -Ordered HIV screening test considering risk sexual behavior, last HIV test was in 2018 which was negative

## 2018-01-17 ENCOUNTER — Telehealth: Payer: Self-pay | Admitting: *Deleted

## 2018-01-17 ENCOUNTER — Encounter: Payer: Medicare Other | Attending: Physical Medicine & Rehabilitation | Admitting: Physical Medicine & Rehabilitation

## 2018-01-17 LAB — HIV ANTIBODY (ROUTINE TESTING W REFLEX): HIV Screen 4th Generation wRfx: NONREACTIVE

## 2018-01-17 NOTE — Addendum Note (Signed)
Addended by: Hulan Fray on: 01/17/2018 07:20 PM   Modules accepted: Orders

## 2018-01-17 NOTE — Telephone Encounter (Signed)
Patient notified HIV negative. Very appreciative. Hubbard Hartshorn, RN, BSN

## 2018-01-17 NOTE — Telephone Encounter (Signed)
-----   Message from Lars Mage, MD sent at 01/17/2018  8:41 AM EDT ----- Attempted to call patient about negative result. Will try again.

## 2018-01-17 NOTE — Telephone Encounter (Signed)
Thank you :)

## 2018-01-18 ENCOUNTER — Telehealth: Payer: Self-pay

## 2018-01-18 NOTE — Telephone Encounter (Signed)
Attempted to reach patient x 2. No voicemail available.

## 2018-01-25 ENCOUNTER — Encounter: Payer: Self-pay | Admitting: Internal Medicine

## 2018-01-25 NOTE — Assessment & Plan Note (Deleted)
TSH checked at last visit. Slightly elevated but T4 normal. No need for synthroid.   Plan: - Continue to monitor

## 2018-01-25 NOTE — Progress Notes (Signed)
Internal Medicine Clinic Attending  Case discussed with Dr. Chundi at the time of the visit.  We reviewed the resident's history and exam and pertinent patient test results.  I agree with the assessment, diagnosis, and plan of care documented in the resident's note.  Alexander Raines, M.D., Ph.D.  

## 2018-01-25 NOTE — Assessment & Plan Note (Deleted)
Patient doing well without orthopnea, PND, abdominal swelling, or change in exercise capacity. She is continuing her Bumex 1 mg BID. Her weight is stable. She is following a fluid restriction. We will work on BP control.   Plan: - BP control  - Continue Bumex

## 2018-01-25 NOTE — Assessment & Plan Note (Deleted)
BP above goal of 130/80 on prior 3 visits. She is not currently on any antihypertensives. She had a BMP checked 4 weeks ago that showed a creatinine of 1.37. Given her diabetes she would benefit from initiation of an ACE-I.   Plan: - Start Lisinopril 10 mg QD  - Repeat BMP in 4 weeks

## 2018-01-25 NOTE — Progress Notes (Deleted)
   CC: DM  HPI:  [name] is a 42 y.o. female who presented to the clinic for continued evaluation and management of her chronic medical illnesses. For a detailed assessment and plan please refer to problem based charting below.   Past Medical History:  Diagnosis Date  . Abdominal muscle pain 09/08/2016  . Abnormal Pap smear of cervix 2009  . Abscess    history of multiple abscesses  . Acute bilateral low back pain 02/13/2017  . Acute blood loss anemia   . Anemia of chronic disease 2002  . Anxiety    Panic attacks  . Bilateral lower extremity edema 05/13/2016  . Bipolar disorder ([place])   . Cellulitis 05/21/2014   right eye  . Chronic bronchitis ([place])    "get it q yr" (05/13/2013)  . Chronic pain   . Depression   . Edema of lower extremity   . Endocarditis 2002   subacute bacterial endocarditis.   . Family history of anesthesia complication    "my mom has a hard time coming out from under"  . Fibromyalgia   . GERD (gastroesophageal reflux disease)    occ  . Heart murmur   . History of blood transfusion    "just low blood count" (05/13/2013)  . Hyperlipidemia   . Hypertension   . Hypothyroidism   . Hypothyroidism, adult 03/21/2014  . Leukocytosis   . Necrosis ([place])    and ulceration  . Obesity   . OSA on CPAP    does not wear CPAP  . Peripheral neuropathy   . Type II diabetes mellitus ([facility])    Type  II   Review of Systems:   12 point ROS preformed. All negative aside from those mentioned in the HPI.  Physical Exam: There were no vitals filed for this visit. General: Obese female in no acute distress Pulm: Good air movement with no wheezing or crackles  CV: RRR, no murmurs, no rubs  Abdomen: Active bowel sounds, soft, non-distended, no tenderness to palpation   Assessment & Plan:   See Encounters Tab for problem based charting.  Patient {GC/GE:[id number]::"discussed with","seen with"} Dr. {NAMES:[id number]::"Butcher","Granfortuna","E.  Hoffman","[place]","Mullen","Narendra","Raines","Vincent"}

## 2018-01-25 NOTE — Assessment & Plan Note (Deleted)
Patient with uncontrolled DM with last A1c on 11/30/17 of 11.4%. She continue to check her CBGs ** daily. Her average CBG is **. She occasionally missed her Lantus and Novolog but consistently takes her Victoza. She is exercising daily with **. Her weight is stable over the past 12 months.   I have reviewed her records and it appears that she previous was on Metformin and Invokana. She tolerated both of these medication but they appear to have just fallen off her medication regimen as she was not taking them. We discussed restarting the **.   Plan: - Continue Victoza  - Continue Lantus 50 units BID  - Continue Novolog 13 units TID WC - START ** - Follow-up in 3 months

## 2018-01-25 NOTE — Assessment & Plan Note (Deleted)
Assess by Dr. Danford Bad on 7/11. Felt there may be an underlying binge eating disorder and her Prozac was increased. This has **. Her weight is stable over the past 12 months. We discussed various weight loss methods (medications vs surgical) and the importance of lifestyle change. She is ** in seeing a dietician.   Plan: - Referral to dietician  - Encouraged lifestyle changes  - Continue Prozac 60 mg QD

## 2018-01-29 ENCOUNTER — Other Ambulatory Visit: Payer: Self-pay

## 2018-01-29 ENCOUNTER — Other Ambulatory Visit: Payer: Self-pay | Admitting: Internal Medicine

## 2018-01-29 ENCOUNTER — Ambulatory Visit (INDEPENDENT_AMBULATORY_CARE_PROVIDER_SITE_OTHER): Payer: Medicare Other | Admitting: Internal Medicine

## 2018-01-29 VITALS — BP 158/95 | HR 97 | Temp 98.2°F | Ht 70.0 in | Wt 328.4 lb

## 2018-01-29 DIAGNOSIS — A6004 Herpesviral vulvovaginitis: Secondary | ICD-10-CM | POA: Diagnosis not present

## 2018-01-29 DIAGNOSIS — Z89512 Acquired absence of left leg below knee: Secondary | ICD-10-CM

## 2018-01-29 DIAGNOSIS — R234 Changes in skin texture: Secondary | ICD-10-CM

## 2018-01-29 DIAGNOSIS — B009 Herpesviral infection, unspecified: Secondary | ICD-10-CM

## 2018-01-29 MED ORDER — CLINDAMYCIN HCL 150 MG PO CAPS: 450.0000 mg | ORAL_CAPSULE | Freq: Three times a day (TID) | ORAL | 0 refills | Status: AC

## 2018-01-29 MED ORDER — IBUPROFEN 200 MG PO TABS: 600.0000 mg | ORAL_TABLET | Freq: Three times a day (TID) | ORAL | 2 refills | Status: DC | PRN

## 2018-01-29 MED ORDER — VALACYCLOVIR HCL 1 G PO TABS: 1000.0000 mg | ORAL_TABLET | Freq: Two times a day (BID) | ORAL | 0 refills | Status: AC

## 2018-01-29 NOTE — Patient Instructions (Addendum)
It was our pleasure taking care of you here in our clinic today. As we talked, your hand skin peeling might be secondary to bacterial infection that might be added in genital area. We also give you another medication for Herpes simplex. We treat you with antibiotics (Clindamycin) and give you some Ibuprofen for your pain. Please take your medicines as prescribed and contact us if you don't feel better in few days.  Thanks, Dr. Linna Hoff

## 2018-01-29 NOTE — Progress Notes (Addendum)
CC: Hand skin peeling  HPI:  [name] is a 42 y.o. with documented past medical history as below, presented with skin peeling on both her hands since last week. She was diagnosed with genital herpes simplex type 2 weeks ago and treated with Famciclovir for 10 days. She mentions that she has not had any improvement regarding her genital HSV and since last week (After few days of starting Famciclovir, she started to have skin changes on her hands with 1 blister and skin peeling all over her fingers. It is itchy with some pain. She denies fever. No skin changes any where else. She is currently has her period. Changes tampon evrey 4 hrs. She mentions that skin changes started before she has her period.  She denies any previous hx of similar skin changes.   Past Medical History:  Diagnosis Date  . Abdominal muscle pain 09/08/2016  . Abnormal Pap smear of cervix 2009  . Abscess    history of multiple abscesses  . Acute bilateral low back pain 02/13/2017  . Acute blood loss anemia   . Anemia of chronic disease 2002  . Anxiety    Panic attacks  . Bilateral lower extremity edema 05/13/2016  . Bipolar disorder ([place])   . Cellulitis 05/21/2014   right eye  . Chronic bronchitis ([place])    "get it q yr" (05/13/2013)  . Chronic pain   . Depression   . Edema of lower extremity   . Endocarditis 2002   subacute bacterial endocarditis.   . Family history of anesthesia complication    "my mom has a hard time coming out from under"  . Fibromyalgia   . GERD (gastroesophageal reflux disease)    occ  . Heart murmur   . History of blood transfusion    "just low blood count" (05/13/2013)  . Hyperlipidemia   . Hypertension   . Hypothyroidism   . Hypothyroidism, adult 03/21/2014  . Leukocytosis   . Necrosis ([place])    and ulceration  . Obesity   . OSA on CPAP    does not wear CPAP  . Peripheral neuropathy   . Type II diabetes mellitus (HCC)    Type  II   Review of Systems: No  fever, No headache, No Chest pain, No SOB,No rash,  No hematuria, No dysuria, no frequency, No flank pain, No abdominal pain, No diarrhea  Physical Exam:  Vitals:   01/29/18 1053  BP: (!) 167/92  Pulse: (!) 105  Temp: 98.2 F (36.8 C)  TempSrc: Oral  SpO2: 100%  Weight: (!) 328 lb 6.4 oz (149 kg)  Height: 5\' 10"  (1.778 m)   Physical Exam  Constitutional: She is oriented to person, place, and time. She appears well-developed and well-nourished. No distress.  HENT:  Head: Normocephalic and atraumatic.  Cardiovascular: Normal rate, regular rhythm and normal heart sounds.  No murmur heard. Pulmonary/Chest: Effort normal. Decreased breath sound She has no wheezes. She has no rales.  Abdominal: Soft. She exhibits no distension. There is no tenderness. There is no rebound.  Neurological: She is alert and oriented to person, place, and time.  Extremities: left BKA, desquamation on both palms with small ulcer and healed blister. Chronic Skin thickening and edema on Rt lower extremities Pulses are normal and symmetric on upper extremities and palpable on right lower extremities.   Assessment & Plan:  [name] is a 42 y.o. 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  1-Bilateral Desquamation on hands Has Hx of genital HSV type 1 treated with Famciclovir and with no improvement. She has some pain in external part of her genitalia and groin.  On Exam: has bilateral desquamations on plams and 1 scar of blister. Has a small superficial red ulceron tip of the finger. Can be HSV non responsive to treatment with possible hand involvement.  Post strep/staph desquamation   -Clindamycin Cap 450 mg PO q 8h -Ibuprofen 600 mg 4 times a a day PRN with food  2-Genital HSV: Not responded to Famciclovir. Patient has also suspicious small ulcer on her hand. Has erythema on the  skin at her groin, +/-- some skin break down. Might also be bacterial superinfection that also caused skin exfoliation   -Valacyclovir 1000 mg, PO BID x 7 days  Patient seen with Dr. Rebeca Alert

## 2018-01-31 ENCOUNTER — Telehealth: Payer: Self-pay | Admitting: *Deleted

## 2018-01-31 ENCOUNTER — Other Ambulatory Visit: Payer: Self-pay | Admitting: Internal Medicine

## 2018-01-31 DIAGNOSIS — A6009 Herpesviral infection of other urogenital tract: Secondary | ICD-10-CM

## 2018-01-31 MED ORDER — OXYCODONE-ACETAMINOPHEN 5-325 MG PO TABS: 1.0000 | ORAL_TABLET | ORAL | 0 refills | Status: DC | PRN

## 2018-01-31 NOTE — Telephone Encounter (Signed)
Called the patient and talked to her about her medications. She still has pain and asks for Percocet. She is not interested in trying Ibuprofen. Checking [facility] database, prescription sent to pharmacy. Patient notified.

## 2018-01-31 NOTE — Telephone Encounter (Signed)
Call from pt - stated she was not told by the doctor yesterday about putting her on Valtrex. Also was told she will be prescribed Percocet for the pain which was not done. She would like the doctor to call her to explain this to her. Thanks

## 2018-02-05 ENCOUNTER — Encounter (INDEPENDENT_AMBULATORY_CARE_PROVIDER_SITE_OTHER): Payer: Self-pay | Admitting: Orthopedic Surgery

## 2018-02-05 ENCOUNTER — Ambulatory Visit (INDEPENDENT_AMBULATORY_CARE_PROVIDER_SITE_OTHER): Payer: Medicare Other | Admitting: Physician Assistant

## 2018-02-05 VITALS — Ht 70.0 in | Wt 328.4 lb

## 2018-02-05 DIAGNOSIS — Z89512 Acquired absence of left leg below knee: Secondary | ICD-10-CM

## 2018-02-05 MED ORDER — OXYCODONE-ACETAMINOPHEN 5-325 MG PO TABS: 1.0000 | ORAL_TABLET | Freq: Three times a day (TID) | ORAL | 0 refills | Status: AC | PRN

## 2018-02-05 NOTE — Progress Notes (Signed)
Office Visit Note   Patient: [name] [name]           Date of Birth: Jul 17, 1975           MRN: [id number] Visit Date: 02/05/2018              Requested by: Ina Homes, MD [address] [place], [place] [address] PCP: Ina Homes, MD  Chief Complaint  Patient presents with  . Left Knee - Routine Post Op    S/P Left BKA      HPI: Patient is a 42 year old female who underwent a left transtibial amputation revision in January 2019.  She returns for follow-up.  She reports that [facility] is working on making her prosthesis.  She is continued to have a lot of swelling in the left transtibial amputation stump.  She reports that she is wearing her shrinker stocking and on Bumex.  She does have a appointment to see [facility] later today to get her prosthesis fitting and then start physical therapy.  She does request a last prescription for pain medication.  Dr. due to discussed with the patient that she will need to go back to pain management to help with her chronic pain issues.  She reports that she had previously been seen by physical medicine and rehabilitation for her pain management and she would like to go back and see them again.  Assessment & Plan: Visit Diagnoses:  1. Status post below knee amputation, left ([place])   2. Morbid obesity due to excess calories [facility])     Plan: She will follow-up with [facility] later today to get her prosthetic fitting.  She will get moving with physical therapy.  We recommended she continue to wear her stump shrinker stocking to work on her lymphedema.  She should continue elevation to work on her lymphedema.  She will follow-up here in 4 weeks.  Patient was given a prescription for Percocet, but again she is planning on going back to physical medicine and rehabilitation for chronic pain management issues.  Follow-Up Instructions: No follow-ups on file.   Ortho Exam  Patient is alert, oriented, no adenopathy, well-dressed, normal  affect, normal respiratory effort. She has no wounds over her left transtibial amputation but massive lymph edema.  Her calf circumference is 64 cm and the knee circumference is 75 cm.  She does have a defect over the posterior stump at the knee level but no wound.  There are no signs of cellulitis.  Imaging: No results found. No images are attached to the encounter.  Labs: Lab Results  Component Value Date   HGBA1C 11.4 (A) 11/30/2017   HGBA1C 9.6 09/07/2017   HGBA1C 6.5 (H) 06/09/2017   ESRSEDRATE 130 (H) 03/24/2017   ESRSEDRATE 53 (H) 09/14/2015   ESRSEDRATE 132 (H) 07/06/2012   CRP 5.4 (H) 09/14/2015   CRP 23.0 (H) 07/06/2012   REPTSTATUS 08/13/2017 FINAL 08/11/2017   GRAMSTAIN  08/11/2017    MODERATE WBC PRESENT, PREDOMINANTLY PMN FEW GRAM POSITIVE COCCI    CULT  08/11/2017    ABUNDANT GROUP B STREP(S.AGALACTIAE)ISOLATED TESTING AGAINST S. AGALACTIAE NOT ROUTINELY PERFORMED DUE TO PREDICTABILITY OF AMP/PEN/VAN SUSCEPTIBILITY. Performed at [facility], [place] [address] [place], [place] [address]    LABORGA ENTEROCOCCUS FAECALIS 06/09/2017     Lab Results  Component Value Date   ALBUMIN 1.9 (L) 08/11/2017   ALBUMIN 2.9 (L) 06/09/2017   ALBUMIN 2.3 (L) 04/12/2017    Body mass index is  47.12 kg/m.  Orders:  No orders of the defined types were placed in this encounter.  Meds ordered this encounter  Medications  . oxyCODONE-acetaminophen (PERCOCET/ROXICET) 5-325 MG tablet    Sig: Take 1 tablet by mouth every 8 (eight) hours as needed for up to 5 days for severe pain.    Dispense:  20 tablet    Refill:  0     Procedures: No procedures performed  Clinical Data: No additional findings.  ROS:  All other systems negative, except as noted in the HPI. Review of Systems  Objective: Vital Signs: Ht 5\' 10"  (1.778 m)   Wt (!) 328 lb 6.4 oz (149 kg)   LMP 01/25/2018   BMI 47.12 kg/m   Specialty Comments:  No specialty comments available.  PMFS  History: Patient Active Problem List   Diagnosis Date Noted  . Herpes genitalis in women 01/31/2018  . Herpes simplex type 1 infection 01/16/2018  . Dehiscence of amputation stump ([place])   . Status post below knee amputation, left ([place]) 04/11/2017  . Bipolar affective disorder ([place])   . Adjustment disorder with depressed mood 03/26/2017  . Abnormal ECG 03/24/2017  . Heart failure with preserved ejection fraction ([place]) 02/09/2017  . Routine adult health maintenance 12/08/2016  . Chronic pain of right knee 09/08/2016  . CKD (chronic kidney disease) stage 2, GFR 60-89 ml/min 07/15/2016  . OSA (obstructive sleep apnea) 05/13/2016  . Morbid obesity due to excess calories ([place]) 11/27/2015  . History of Low serum cortisol level ([place]) 01/07/2015  . Hypothyroidism 04/25/2013  . Financial difficulty 04/25/2013  . Uncontrolled type 2 diabetes mellitus ([place]) 07/12/2012  . Necrotizing fasciitis s/p OR debridements 07/06/2012  . Carpal tunnel syndrome, bilateral 01/25/2012  . Diabetic peripheral neuropathy ([place]) 07/04/2011  . Anxiety and depression 06/02/2011  . History of abnormal cervical Pap smear 12/13/2007  . GERD 12/06/2007  . Hyperlipidemia 08/29/2007  . Hypertension associated with diabetes ([place]) 08/29/2007   Past Medical History:  Diagnosis Date  . Abdominal muscle pain 09/08/2016  . Abnormal Pap smear of cervix 2009  . Abscess    history of multiple abscesses  . Acute bilateral low back pain 02/13/2017  . Acute blood loss anemia   . Anemia of chronic disease 2002  . Anxiety    Panic attacks  . Bilateral lower extremity edema 05/13/2016  . Bipolar disorder ([place])   . Cellulitis 05/21/2014   right eye  . Chronic bronchitis ([place])    "get it q yr" (05/13/2013)  . Chronic pain   . Depression   . Edema of lower extremity   . Endocarditis 2002   subacute bacterial endocarditis.   . Family history of anesthesia complication    "my mom has a hard time coming out from under"  .  Fibromyalgia   . GERD (gastroesophageal reflux disease)    occ  . Heart murmur   . History of blood transfusion    "just low blood count" (05/13/2013)  . Hyperlipidemia   . Hypertension   . Hypothyroidism   . Hypothyroidism, adult 03/21/2014  . Leukocytosis   . Necrosis ([place])    and ulceration  . Obesity   . OSA on CPAP    does not wear CPAP  . Peripheral neuropathy   . Type II diabetes mellitus (HCC)    Type  II    Family History  Problem Relation Age of Onset  . Heart failure Mother   . Diabetes Mother   . Kidney disease  Mother   . Kidney disease Father   . Diabetes Father   . Diabetes Paternal Grandmother   . Heart failure Paternal Grandmother   . Other Other     Past Surgical History:  Procedure Laterality Date  . AMPUTATION Left 04/07/2017   Procedure: LEFT BELOW KNEE AMPUTATION;  Surgeon: Newt Minion, MD;  Location: [facility];  Service: Orthopedics;  Laterality: Left;  . EYE SURGERY     [place]  . INCISION AND DRAINAGE ABSCESS     multiple I&Ds  . INCISION AND DRAINAGE ABSCESS Left 07/09/2012   Procedure: DRESSING CHANGE, THIGH WOUND;  Surgeon: Harl Bowie, MD;  Location: [facility];  Service: General;  Laterality: Left;  . INCISION AND DRAINAGE OF WOUND Left 07/07/2012   Procedure: IRRIGATION AND DEBRIDEMENT WOUND;  Surgeon: Harl Bowie, MD;  Location: [facility];  Service: General;  Laterality: Left;  . INCISION AND DRAINAGE PERIRECTAL ABSCESS Left 07/14/2012   Procedure: DEBRIDEMENT OF SKIN & SOFT TISSUE; DRESSING CHANGE UNDER ANESTHESIA;  Surgeon: Gayland Curry, MD,FACS;  Location: [facility];  Service: General;  Laterality: Left;  . INCISION AND DRAINAGE PERIRECTAL ABSCESS Left 07/16/2012   Procedure: I&D Left Thigh;  Surgeon: Gwenyth Ober, MD;  Location: [facility];  Service: General;  Laterality: Left;  . INCISION AND DRAINAGE PERIRECTAL ABSCESS N/A 01/05/2015   Procedure: IRRIGATION AND DEBRIDEMENT PERIRECTAL ABSCESS;  Surgeon: Donnie Mesa, MD;  Location: [facility];   Service: General;  Laterality: N/A;  . IRRIGATION AND DEBRIDEMENT ABSCESS Left 07/06/2012   Procedure: IRRIGATION AND DEBRIDEMENT ABSCESS BUTTOCKS AND THIGH;  Surgeon: Shann Medal, MD;  Location: [facility];  Service: General;  Laterality: Left;  . IRRIGATION AND DEBRIDEMENT ABSCESS Left 08/10/2012   Procedure: IRRIGATION AND DEBRIDEMENT ABSCESS;  Surgeon: Madilyn Hook, DO;  Location: [facility];  Service: General;  Laterality: Left;  . STUMP REVISION Left 06/09/2017   Procedure: REVISION LEFT BELOW KNEE AMPUTATION;  Surgeon: Newt Minion, MD;  Location: [facility];  Service: Orthopedics;  Laterality: Left;  . TONSILLECTOMY  1994   Social History   Occupational History  . Occupation: disability  Tobacco Use  . Smoking status: Current Every Day Smoker    Packs/day: 0.40    Years: 15.00    Pack years: 6.00    Types: Cigarettes  . Smokeless tobacco: Never Used  . Tobacco comment: 4 per day   Substance and Sexual Activity  . Alcohol use: Yes    Alcohol/week: 0.0 standard drinks    Comment: Socially-" monthy  maybe"  . Drug use: No  . Sexual activity: Yes

## 2018-02-05 NOTE — Progress Notes (Signed)
Internal Medicine Clinic Attending  I saw and evaluated the patient.  I personally confirmed the key portions of the history and exam documented by Dr. Myrtie Hawk and I reviewed pertinent patient test results.  The assessment, diagnosis, and plan were formulated together and I agree with the documentation in the resident's note.  Here for follow up of recent HSV genital infection. Unfortunately, still symptomatic despite completing 10 day course of famciclovir. Also now noting desquamation of both hands. Skin exam significant for about 1 cm yellowish ulceration at base of finger on left hand and pale vesicles on erythematous background on pulp of another finger on right (photos in media tab). Genital exam performed with Dr. Myrtie Hawk showed widespread erythema in linear streaks over both labia and extending throughout the perineum and onto the proximal inner thighs, very tender to palpation.  Exam is concerning for superinfection of her herpes genitalis, possibly by staph or strep with accompanying desquamating rash on hands similar to staph scalded skin syndrome. Candida is also a possibility given the "beefy red" appearance of her vulvar area, but would not explain hand rash. Area of vesicles, erythema and swelling on right hand concerning for herpetic whitlow, possibly acquired due to washing her perineum during the HSV outbreak. Will treat with 14 days of valacyclovir as well as clindamycin for probable bacterial superinfection to cover for staph and strep and decrease toxin activity.  If she does not improve over the next few days, I would consider admitting her for IV acyclovir, antibiotics, and ID consultation.   Lenice Pressman, M.D., Ph.D.

## 2018-02-06 ENCOUNTER — Encounter (INDEPENDENT_AMBULATORY_CARE_PROVIDER_SITE_OTHER): Payer: Self-pay | Admitting: Physician Assistant

## 2018-02-26 ENCOUNTER — Encounter (INDEPENDENT_AMBULATORY_CARE_PROVIDER_SITE_OTHER): Payer: Self-pay | Admitting: Orthopedic Surgery

## 2018-02-26 ENCOUNTER — Telehealth (INDEPENDENT_AMBULATORY_CARE_PROVIDER_SITE_OTHER): Payer: Self-pay

## 2018-02-26 ENCOUNTER — Ambulatory Visit (INDEPENDENT_AMBULATORY_CARE_PROVIDER_SITE_OTHER): Payer: Medicare Other | Admitting: Physician Assistant

## 2018-02-26 VITALS — Ht 70.0 in | Wt 328.0 lb

## 2018-02-26 DIAGNOSIS — I890 Lymphedema, not elsewhere classified: Secondary | ICD-10-CM | POA: Diagnosis not present

## 2018-02-26 DIAGNOSIS — E1142 Type 2 diabetes mellitus with diabetic polyneuropathy: Secondary | ICD-10-CM

## 2018-02-26 DIAGNOSIS — N182 Chronic kidney disease, stage 2 (mild): Secondary | ICD-10-CM

## 2018-02-26 DIAGNOSIS — E43 Unspecified severe protein-calorie malnutrition: Secondary | ICD-10-CM

## 2018-02-26 DIAGNOSIS — Z89512 Acquired absence of left leg below knee: Secondary | ICD-10-CM

## 2018-02-26 MED ORDER — OXYCODONE-ACETAMINOPHEN 5-325 MG PO TABS: 1.0000 | ORAL_TABLET | Freq: Three times a day (TID) | ORAL | 0 refills | Status: AC | PRN

## 2018-02-26 NOTE — Telephone Encounter (Signed)
Juliann Pulse pharmacist with [facility] called wanting to know if Rx for Oxycodone needed to be filled today, due to patient having a Rx filled over the weekend by another provider.   Talked with Erlinda Hong, PA to Dr. Sharol Given and she stated to hold off filling Rx for Oxycodone until patient has completed previous Rx from other provider.  Talked with Juliann Pulse, pharmacist at [facility] and advised her of message above per [facility], [place].

## 2018-02-27 ENCOUNTER — Encounter (INDEPENDENT_AMBULATORY_CARE_PROVIDER_SITE_OTHER): Payer: Self-pay | Admitting: Physician Assistant

## 2018-02-27 NOTE — Progress Notes (Signed)
Office Visit Note   Patient: [name] [name]           Date of Birth: Oct 20, 1975           MRN: [id number] Visit Date: 02/26/2018              Requested by: Ina Homes, MD [address] [place], [place] [address] PCP: Ina Homes, MD  Chief Complaint  Patient presents with  . Left Leg - Follow-up    Left BKA      HPI: Patient is a 42 year old female who presents for follow-up following her left below the knee amputation revision in January 2019.  She has been working with [facility] clinic to get her prosthesis made but continues to have severe swelling and lymphedema in the left transtibial amputation stump.  She returns today with massive edema of the left BKA and reports she has been unable to wear her silicone liner or any type of a shrinker stocking due to her severe edema and pain in the stump.  She reports her pain is severe and she is having difficulty tolerating doing anything due to the severe pain.  Assessment & Plan: Visit Diagnoses:  1. Status post below knee amputation, left (HCC)   2. Lymphedema of left lower extremity   3. Type 2 diabetes mellitus with diabetic polyneuropathy, unspecified whether long term insulin use ([place])   4. Morbid obesity due to excess calories ([place])   5. CKD (chronic kidney disease) stage 2, GFR 60-89 ml/min   6. Severe protein-calorie malnutrition ([place])     Plan: Dynaflex 3 layer compression dressing was applied to the left transtibial amputation stump.  The patient will leave this in place for the next week and will see her next week in the office.  We have asked her to hold off further prosthetic fitting with [facility] due to her severe edema.  Prescription for Percocet 5/325 1 p.o. every 8 hours as needed pain #21 given this visit.  Follow-Up Instructions: Return in about 1 week (around 03/05/2018).   Ortho Exam  Patient is alert, oriented, no adenopathy, well-dressed, tearful affect, normal respiratory effort.  She presents  in the wheelchair. There is massive edema of the left transtibial stump and she is weeping significant amount of serous drainage without a clear overt ulceration.  There is no signs of cellulitis just massive edema.  She is tender to palpation.  Imaging: No results found. No images are attached to the encounter.  Labs: Lab Results  Component Value Date   HGBA1C 11.4 (A) 11/30/2017   HGBA1C 9.6 09/07/2017   HGBA1C 6.5 (H) 06/09/2017   ESRSEDRATE 130 (H) 03/24/2017   ESRSEDRATE 53 (H) 09/14/2015   ESRSEDRATE 132 (H) 07/06/2012   CRP 5.4 (H) 09/14/2015   CRP 23.0 (H) 07/06/2012   REPTSTATUS 08/13/2017 FINAL 08/11/2017   GRAMSTAIN  08/11/2017    MODERATE WBC PRESENT, PREDOMINANTLY PMN FEW GRAM POSITIVE COCCI    CULT  08/11/2017    ABUNDANT GROUP B STREP(S.AGALACTIAE)ISOLATED TESTING AGAINST S. AGALACTIAE NOT ROUTINELY PERFORMED DUE TO PREDICTABILITY OF AMP/PEN/VAN SUSCEPTIBILITY. Performed at [facility], [place] [address], [place], [place] [address]    LABORGA ENTEROCOCCUS FAECALIS 06/09/2017     Lab Results  Component Value Date   ALBUMIN 1.9 (L) 08/11/2017   ALBUMIN 2.9 (L) 06/09/2017   ALBUMIN 2.3 (L) 04/12/2017    Body mass index is 47.06 kg/m.  Orders:  No orders of the defined types were placed in  this encounter.  Meds ordered this encounter  Medications  . oxyCODONE-acetaminophen (PERCOCET) 5-325 MG tablet    Sig: Take 1 tablet by mouth every 8 (eight) hours as needed for up to 7 days for moderate pain or severe pain.    Dispense:  21 tablet    Refill:  0     Procedures: No procedures performed  Clinical Data: No additional findings.  ROS:  All other systems negative, except as noted in the HPI. Review of Systems  Objective: Vital Signs: Ht 5\' 10"  (1.778 m)   Wt (!) 328 lb (148.8 kg)   BMI 47.06 kg/m   Specialty Comments:  No specialty comments available.  PMFS History: Patient Active Problem List   Diagnosis Date Noted  . Herpes  genitalis in women 01/31/2018  . Herpes simplex type 1 infection 01/16/2018  . Dehiscence of amputation stump ([place])   . Status post below knee amputation, left ([place]) 04/11/2017  . Bipolar affective disorder ([place])   . Adjustment disorder with depressed mood 03/26/2017  . Abnormal ECG 03/24/2017  . Heart failure with preserved ejection fraction ([place]) 02/09/2017  . Routine adult health maintenance 12/08/2016  . Chronic pain of right knee 09/08/2016  . CKD (chronic kidney disease) stage 2, GFR 60-89 ml/min 07/15/2016  . OSA (obstructive sleep apnea) 05/13/2016  . Morbid obesity due to excess calories ([place]) 11/27/2015  . History of Low serum cortisol level ([place]) 01/07/2015  . Hypothyroidism 04/25/2013  . Financial difficulty 04/25/2013  . Uncontrolled type 2 diabetes mellitus ([place]) 07/12/2012  . Necrotizing fasciitis s/p OR debridements 07/06/2012  . Carpal tunnel syndrome, bilateral 01/25/2012  . Diabetic peripheral neuropathy ([place]) 07/04/2011  . Anxiety and depression 06/02/2011  . History of abnormal cervical Pap smear 12/13/2007  . GERD 12/06/2007  . Hyperlipidemia 08/29/2007  . Hypertension associated with diabetes ([place]) 08/29/2007   Past Medical History:  Diagnosis Date  . Abdominal muscle pain 09/08/2016  . Abnormal Pap smear of cervix 2009  . Abscess    history of multiple abscesses  . Acute bilateral low back pain 02/13/2017  . Acute blood loss anemia   . Anemia of chronic disease 2002  . Anxiety    Panic attacks  . Bilateral lower extremity edema 05/13/2016  . Bipolar disorder ([place])   . Cellulitis 05/21/2014   right eye  . Chronic bronchitis ([place])    "get it q yr" (05/13/2013)  . Chronic pain   . Depression   . Edema of lower extremity   . Endocarditis 2002   subacute bacterial endocarditis.   . Family history of anesthesia complication    "my mom has a hard time coming out from under"  . Fibromyalgia   . GERD (gastroesophageal reflux disease)    occ  . Heart  murmur   . History of blood transfusion    "just low blood count" (05/13/2013)  . Hyperlipidemia   . Hypertension   . Hypothyroidism   . Hypothyroidism, adult 03/21/2014  . Leukocytosis   . Necrosis ([place])    and ulceration  . Obesity   . OSA on CPAP    does not wear CPAP  . Peripheral neuropathy   . Type II diabetes mellitus (HCC)    Type  II    Family History  Problem Relation Age of Onset  . Heart failure Mother   . Diabetes Mother   . Kidney disease Mother   . Kidney disease Father   . Diabetes Father   . Diabetes Paternal  Grandmother   . Heart failure Paternal Grandmother   . Other Other     Past Surgical History:  Procedure Laterality Date  . AMPUTATION Left 04/07/2017   Procedure: LEFT BELOW KNEE AMPUTATION;  Surgeon: Newt Minion, MD;  Location: [facility];  Service: Orthopedics;  Laterality: Left;  . EYE SURGERY     lazer  . INCISION AND DRAINAGE ABSCESS     multiple I&Ds  . INCISION AND DRAINAGE ABSCESS Left 07/09/2012   Procedure: DRESSING CHANGE, THIGH WOUND;  Surgeon: Harl Bowie, MD;  Location: [facility];  Service: General;  Laterality: Left;  . INCISION AND DRAINAGE OF WOUND Left 07/07/2012   Procedure: IRRIGATION AND DEBRIDEMENT WOUND;  Surgeon: Harl Bowie, MD;  Location: [facility];  Service: General;  Laterality: Left;  . INCISION AND DRAINAGE PERIRECTAL ABSCESS Left 07/14/2012   Procedure: DEBRIDEMENT OF SKIN & SOFT TISSUE; DRESSING CHANGE UNDER ANESTHESIA;  Surgeon: Gayland Curry, MD,FACS;  Location: [facility];  Service: General;  Laterality: Left;  . INCISION AND DRAINAGE PERIRECTAL ABSCESS Left 07/16/2012   Procedure: I&D Left Thigh;  Surgeon: Gwenyth Ober, MD;  Location: [facility];  Service: General;  Laterality: Left;  . INCISION AND DRAINAGE PERIRECTAL ABSCESS N/A 01/05/2015   Procedure: IRRIGATION AND DEBRIDEMENT PERIRECTAL ABSCESS;  Surgeon: Donnie Mesa, MD;  Location: [facility];  Service: General;  Laterality: N/A;  . IRRIGATION AND DEBRIDEMENT ABSCESS  Left 07/06/2012   Procedure: IRRIGATION AND DEBRIDEMENT ABSCESS BUTTOCKS AND THIGH;  Surgeon: Shann Medal, MD;  Location: [facility];  Service: General;  Laterality: Left;  . IRRIGATION AND DEBRIDEMENT ABSCESS Left 08/10/2012   Procedure: IRRIGATION AND DEBRIDEMENT ABSCESS;  Surgeon: Madilyn Hook, DO;  Location: [facility];  Service: General;  Laterality: Left;  . STUMP REVISION Left 06/09/2017   Procedure: REVISION LEFT BELOW KNEE AMPUTATION;  Surgeon: Newt Minion, MD;  Location: [facility];  Service: Orthopedics;  Laterality: Left;  . TONSILLECTOMY  1994   Social History   Occupational History  . Occupation: disability  Tobacco Use  . Smoking status: Current Every Day Smoker    Packs/day: 0.40    Years: 15.00    Pack years: 6.00    Types: Cigarettes  . Smokeless tobacco: Never Used  . Tobacco comment: 4 per day   Substance and Sexual Activity  . Alcohol use: Yes    Alcohol/week: 0.0 standard drinks    Comment: Socially-" monthy  maybe"  . Drug use: No  . Sexual activity: Yes

## 2018-02-28 ENCOUNTER — Other Ambulatory Visit: Payer: Self-pay | Admitting: Internal Medicine

## 2018-02-28 DIAGNOSIS — E785 Hyperlipidemia, unspecified: Secondary | ICD-10-CM

## 2018-03-03 ENCOUNTER — Other Ambulatory Visit: Payer: Self-pay | Admitting: Internal Medicine

## 2018-03-03 DIAGNOSIS — IMO0002 Reserved for concepts with insufficient information to code with codable children: Secondary | ICD-10-CM

## 2018-03-03 DIAGNOSIS — E1165 Type 2 diabetes mellitus with hyperglycemia: Principal | ICD-10-CM

## 2018-03-03 DIAGNOSIS — E1142 Type 2 diabetes mellitus with diabetic polyneuropathy: Secondary | ICD-10-CM

## 2018-03-05 ENCOUNTER — Ambulatory Visit (INDEPENDENT_AMBULATORY_CARE_PROVIDER_SITE_OTHER): Payer: Medicare Other | Admitting: Orthopedic Surgery

## 2018-03-05 ENCOUNTER — Encounter (INDEPENDENT_AMBULATORY_CARE_PROVIDER_SITE_OTHER): Payer: Self-pay | Admitting: Orthopedic Surgery

## 2018-03-05 VITALS — Ht 70.0 in | Wt 328.0 lb

## 2018-03-05 DIAGNOSIS — I890 Lymphedema, not elsewhere classified: Secondary | ICD-10-CM | POA: Diagnosis not present

## 2018-03-05 DIAGNOSIS — Z89512 Acquired absence of left leg below knee: Secondary | ICD-10-CM | POA: Diagnosis not present

## 2018-03-06 ENCOUNTER — Encounter (INDEPENDENT_AMBULATORY_CARE_PROVIDER_SITE_OTHER): Payer: Self-pay | Admitting: Orthopedic Surgery

## 2018-03-06 ENCOUNTER — Other Ambulatory Visit: Payer: Self-pay | Admitting: Internal Medicine

## 2018-03-06 ENCOUNTER — Other Ambulatory Visit (INDEPENDENT_AMBULATORY_CARE_PROVIDER_SITE_OTHER): Payer: Self-pay

## 2018-03-06 DIAGNOSIS — I890 Lymphedema, not elsewhere classified: Secondary | ICD-10-CM

## 2018-03-06 DIAGNOSIS — Z89512 Acquired absence of left leg below knee: Secondary | ICD-10-CM

## 2018-03-06 NOTE — Progress Notes (Signed)
Office Visit Note   Patient: [name] [name]           Date of Birth: 11/28/1975           MRN: [id number] Visit Date: 03/05/2018              Requested by: Ina Homes, MD [address] [place], [place] [address] PCP: Ina Homes, MD  Chief Complaint  Patient presents with  . Left Leg - Follow-up    BKA s/p dynaflex compression wrap       HPI: Patient is a 42 year old woman who presents in follow-up for left transtibial irritation.  Patient has had massive lymphedema and has recently undergone a Dynaflex wrap.  She states the wrap has just slipped off due to decreased swelling.  Assessment & Plan: Visit Diagnoses:  1. Status post below knee amputation, left (HCC)   2. Lymphedema of left lower extremity     Plan: Patient is showing excellent decrease the edema from the compression wrap we will reapply a compression wrap and follow-up in a week.  Patient request a referral to a pain management clinic we will make that referral for her.  Follow-Up Instructions: Return in about 1 week (around 03/12/2018).   Ortho Exam  Patient is alert, oriented, no adenopathy, well-dressed, normal affect, normal respiratory effort. Examination patient has significant decreased swelling in the residual limb secondary to the compression wrap.  The skin wrinkles well there is no ulcers no drainage no odor no signs of infection.  Patient's hemoglobin A1c is 11.4 albumin 1.9.  Imaging: No results found. No images are attached to the encounter.  Labs: Lab Results  Component Value Date   HGBA1C 11.4 (A) 11/30/2017   HGBA1C 9.6 09/07/2017   HGBA1C 6.5 (H) 06/09/2017   ESRSEDRATE 130 (H) 03/24/2017   ESRSEDRATE 53 (H) 09/14/2015   ESRSEDRATE 132 (H) 07/06/2012   CRP 5.4 (H) 09/14/2015   CRP 23.0 (H) 07/06/2012   REPTSTATUS 08/13/2017 FINAL 08/11/2017   GRAMSTAIN  08/11/2017    MODERATE WBC PRESENT, PREDOMINANTLY PMN FEW GRAM POSITIVE COCCI    CULT  08/11/2017    ABUNDANT GROUP  B STREP(S.AGALACTIAE)ISOLATED TESTING AGAINST S. AGALACTIAE NOT ROUTINELY PERFORMED DUE TO PREDICTABILITY OF AMP/PEN/VAN SUSCEPTIBILITY. Performed at [facility], [place] [address] [place], [place] [address]    LABORGA ENTEROCOCCUS FAECALIS 06/09/2017     Lab Results  Component Value Date   ALBUMIN 1.9 (L) 08/11/2017   ALBUMIN 2.9 (L) 06/09/2017   ALBUMIN 2.3 (L) 04/12/2017    Body mass index is 47.06 kg/m.  Orders:  No orders of the defined types were placed in this encounter.  No orders of the defined types were placed in this encounter.    Procedures: No procedures performed  Clinical Data: No additional findings.  ROS:  All other systems negative, except as noted in the HPI. Review of Systems  Objective: Vital Signs: Ht 5\' 10"  (1.778 m)   Wt (!) 328 lb (148.8 kg)   BMI 47.06 kg/m   Specialty Comments:  No specialty comments available.  PMFS History: Patient Active Problem List   Diagnosis Date Noted  . Herpes genitalis in women 01/31/2018  . Herpes simplex type 1 infection 01/16/2018  . Dehiscence of amputation stump ([place])   . Status post below knee amputation, left ([place]) 04/11/2017  . Bipolar affective disorder ([place])   . Adjustment disorder with depressed mood 03/26/2017  . Abnormal ECG 03/24/2017  . Heart failure with preserved  ejection fraction ([place]) 02/09/2017  . Routine adult health maintenance 12/08/2016  . Chronic pain of right knee 09/08/2016  . CKD (chronic kidney disease) stage 2, GFR 60-89 ml/min 07/15/2016  . OSA (obstructive sleep apnea) 05/13/2016  . Morbid obesity due to excess calories ([place]) 11/27/2015  . History of Low serum cortisol level ([place]) 01/07/2015  . Hypothyroidism 04/25/2013  . Financial difficulty 04/25/2013  . Uncontrolled type 2 diabetes mellitus ([place]) 07/12/2012  . Necrotizing fasciitis s/p OR debridements 07/06/2012  . Carpal tunnel syndrome, bilateral 01/25/2012  . Diabetic peripheral neuropathy ([place])  07/04/2011  . Anxiety and depression 06/02/2011  . History of abnormal cervical Pap smear 12/13/2007  . GERD 12/06/2007  . Hyperlipidemia 08/29/2007  . Hypertension associated with diabetes ([place]) 08/29/2007   Past Medical History:  Diagnosis Date  . Abdominal muscle pain 09/08/2016  . Abnormal Pap smear of cervix 2009  . Abscess    history of multiple abscesses  . Acute bilateral low back pain 02/13/2017  . Acute blood loss anemia   . Anemia of chronic disease 2002  . Anxiety    Panic attacks  . Bilateral lower extremity edema 05/13/2016  . Bipolar disorder ([place])   . Cellulitis 05/21/2014   right eye  . Chronic bronchitis ([place])    "get it q yr" (05/13/2013)  . Chronic pain   . Depression   . Edema of lower extremity   . Endocarditis 2002   subacute bacterial endocarditis.   . Family history of anesthesia complication    "my mom has a hard time coming out from under"  . Fibromyalgia   . GERD (gastroesophageal reflux disease)    occ  . Heart murmur   . History of blood transfusion    "just low blood count" (05/13/2013)  . Hyperlipidemia   . Hypertension   . Hypothyroidism   . Hypothyroidism, adult 03/21/2014  . Leukocytosis   . Necrosis ([place])    and ulceration  . Obesity   . OSA on CPAP    does not wear CPAP  . Peripheral neuropathy   . Type II diabetes mellitus (HCC)    Type  II    Family History  Problem Relation Age of Onset  . Heart failure Mother   . Diabetes Mother   . Kidney disease Mother   . Kidney disease Father   . Diabetes Father   . Diabetes Paternal Grandmother   . Heart failure Paternal Grandmother   . Other Other     Past Surgical History:  Procedure Laterality Date  . AMPUTATION Left 04/07/2017   Procedure: LEFT BELOW KNEE AMPUTATION;  Surgeon: Newt Minion, MD;  Location: [facility];  Service: Orthopedics;  Laterality: Left;  . EYE SURGERY     lazer  . INCISION AND DRAINAGE ABSCESS     multiple I&Ds  . INCISION AND DRAINAGE ABSCESS Left  07/09/2012   Procedure: DRESSING CHANGE, THIGH WOUND;  Surgeon: Harl Bowie, MD;  Location: [facility];  Service: General;  Laterality: Left;  . INCISION AND DRAINAGE OF WOUND Left 07/07/2012   Procedure: IRRIGATION AND DEBRIDEMENT WOUND;  Surgeon: Harl Bowie, MD;  Location: [facility];  Service: General;  Laterality: Left;  . INCISION AND DRAINAGE PERIRECTAL ABSCESS Left 07/14/2012   Procedure: DEBRIDEMENT OF SKIN & SOFT TISSUE; DRESSING CHANGE UNDER ANESTHESIA;  Surgeon: [place] Curry, MD,FACS;  Location: [facility];  Service: General;  Laterality: Left;  . INCISION AND DRAINAGE PERIRECTAL ABSCESS Left 07/16/2012   Procedure: I&D  Left Thigh;  Surgeon: Gwenyth Ober, MD;  Location: [facility];  Service: General;  Laterality: Left;  . INCISION AND DRAINAGE PERIRECTAL ABSCESS N/A 01/05/2015   Procedure: IRRIGATION AND DEBRIDEMENT PERIRECTAL ABSCESS;  Surgeon: Donnie Mesa, MD;  Location: [facility];  Service: General;  Laterality: N/A;  . IRRIGATION AND DEBRIDEMENT ABSCESS Left 07/06/2012   Procedure: IRRIGATION AND DEBRIDEMENT ABSCESS BUTTOCKS AND THIGH;  Surgeon: Shann Medal, MD;  Location: [facility];  Service: General;  Laterality: Left;  . IRRIGATION AND DEBRIDEMENT ABSCESS Left 08/10/2012   Procedure: IRRIGATION AND DEBRIDEMENT ABSCESS;  Surgeon: Madilyn Hook, DO;  Location: [facility];  Service: General;  Laterality: Left;  . STUMP REVISION Left 06/09/2017   Procedure: REVISION LEFT BELOW KNEE AMPUTATION;  Surgeon: Newt Minion, MD;  Location: [facility];  Service: Orthopedics;  Laterality: Left;  . TONSILLECTOMY  1994   Social History   Occupational History  . Occupation: disability  Tobacco Use  . Smoking status: Current Every Day Smoker    Packs/day: 0.40    Years: 15.00    Pack years: 6.00    Types: Cigarettes  . Smokeless tobacco: Never Used  . Tobacco comment: 4 per day   Substance and Sexual Activity  . Alcohol use: Yes    Alcohol/week: 0.0 standard drinks    Comment: Socially-" monthy  maybe"  .  Drug use: No  . Sexual activity: Yes

## 2018-03-12 ENCOUNTER — Other Ambulatory Visit: Payer: Self-pay

## 2018-03-12 ENCOUNTER — Ambulatory Visit (INDEPENDENT_AMBULATORY_CARE_PROVIDER_SITE_OTHER): Payer: Medicare Other | Admitting: Physician Assistant

## 2018-03-12 ENCOUNTER — Ambulatory Visit (INDEPENDENT_AMBULATORY_CARE_PROVIDER_SITE_OTHER): Payer: Medicare Other | Admitting: Internal Medicine

## 2018-03-12 ENCOUNTER — Encounter (INDEPENDENT_AMBULATORY_CARE_PROVIDER_SITE_OTHER): Payer: Self-pay | Admitting: Orthopedic Surgery

## 2018-03-12 VITALS — Ht 70.0 in | Wt 328.0 lb

## 2018-03-12 VITALS — BP 175/106 | HR 93 | Temp 98.0°F | Ht 70.0 in

## 2018-03-12 DIAGNOSIS — Z8619 Personal history of other infectious and parasitic diseases: Secondary | ICD-10-CM

## 2018-03-12 DIAGNOSIS — I890 Lymphedema, not elsewhere classified: Secondary | ICD-10-CM

## 2018-03-12 DIAGNOSIS — F17210 Nicotine dependence, cigarettes, uncomplicated: Secondary | ICD-10-CM | POA: Diagnosis not present

## 2018-03-12 DIAGNOSIS — E1142 Type 2 diabetes mellitus with diabetic polyneuropathy: Secondary | ICD-10-CM

## 2018-03-12 DIAGNOSIS — Z89512 Acquired absence of left leg below knee: Secondary | ICD-10-CM | POA: Diagnosis not present

## 2018-03-12 DIAGNOSIS — E43 Unspecified severe protein-calorie malnutrition: Secondary | ICD-10-CM | POA: Diagnosis not present

## 2018-03-12 DIAGNOSIS — B373 Candidiasis of vulva and vagina: Secondary | ICD-10-CM

## 2018-03-12 DIAGNOSIS — E118 Type 2 diabetes mellitus with unspecified complications: Secondary | ICD-10-CM

## 2018-03-12 DIAGNOSIS — N182 Chronic kidney disease, stage 2 (mild): Secondary | ICD-10-CM | POA: Diagnosis not present

## 2018-03-12 DIAGNOSIS — B3731 Acute candidiasis of vulva and vagina: Secondary | ICD-10-CM | POA: Insufficient documentation

## 2018-03-12 MED ORDER — OXYCODONE-ACETAMINOPHEN 5-325 MG PO TABS: 1.0000 | ORAL_TABLET | ORAL | 0 refills | Status: DC | PRN

## 2018-03-12 MED ORDER — NYSTATIN 100000 UNIT/GM EX POWD: Freq: Four times a day (QID) | CUTANEOUS | 1 refills | Status: DC

## 2018-03-12 MED ORDER — FLUCONAZOLE 150 MG PO TABS: ORAL_TABLET | ORAL | 0 refills | Status: DC

## 2018-03-12 NOTE — Progress Notes (Signed)
CC: vaginal itching and pain   HPI:[name] is a 42 y.o. female who presents for evaluation of vaginal pain and itching that has now extended into her upper/inner thighs and perianal zone. Please see individual problem based A/P for details.  Vaginal candidiasis: Patient stated that since being treated for vaginal HSV-1 (positive culture and serotype testing) with 10 days of famciclovir and initial symptom improvement. Patient stated that she was also treated with an antibiotic at an urgent care center the name of which she does not recall and the antibiotic I can not identify a recently prescribed antibiotic in the chart. She is concerned for >1.5 months of progressively worsening burning vaginal pain, irritation, which has spread to her inner/upper thighs and perianal zone. She has not attempted topical treatment and it has not improved. Pain is worsened with urination.  Physical exam revealed skin breakdown and irritation with an erythematous/ulcerated base, of the entire vaginal area, as well as the perianal area, inner thighs, abdominal panus fold and groin. This area is absent vesicular lesions, and whitish plaques generally but areas of white plaques are noted in the folds with ulcerated bases.  This is most consistent with a severe topical vaginal candidiasis infection following recent antibiotic use and herpes flare in the setting of poorly controlled diabetes.   Plan: We will order fluconazle 150mg  Q72 hours for three doses to complete a 9 day treatment course We will order Nystatin topical powder  She is to return if symptoms worsen or fail to improve in the next 10 days She will likely need a referral to OB/GYN if her symptoms persist and would benefit form more tightly controlled diabetes  PHQ-9: Based on the patients    Office Visit from 03/12/2018 in [facility]  PHQ-9 Total Score  5     score we have decided to monitor after a brief  discussion.  Past Medical History:  Diagnosis Date  . Abdominal muscle pain 09/08/2016  . Abnormal Pap smear of cervix 2009  . Abscess    history of multiple abscesses  . Acute bilateral low back pain 02/13/2017  . Acute blood loss anemia   . Anemia of chronic disease 2002  . Anxiety    Panic attacks  . Bilateral lower extremity edema 05/13/2016  . Bipolar disorder ([place])   . Cellulitis 05/21/2014   right eye  . Chronic bronchitis ([place])    "get it q yr" (05/13/2013)  . Chronic pain   . Depression   . Edema of lower extremity   . Endocarditis 2002   subacute bacterial endocarditis.   . Family history of anesthesia complication    "my mom has a hard time coming out from under"  . Fibromyalgia   . GERD (gastroesophageal reflux disease)    occ  . Heart murmur   . History of blood transfusion    "just low blood count" (05/13/2013)  . Hyperlipidemia   . Hypertension   . Hypothyroidism   . Hypothyroidism, adult 03/21/2014  . Leukocytosis   . Necrosis ([place])    and ulceration  . Obesity   . OSA on CPAP    does not wear CPAP  . Peripheral neuropathy   . Type II diabetes mellitus (HCC)    Type  II   Review of Systems:  ROS negative except as per HPI.  Physical Exam: Vitals:   03/12/18 1604 03/12/18 1606  BP: (!) 209/118 (!) 175/106  Pulse: 98 93  Temp:  56 F (36.7 C)   TempSrc: Oral   SpO2: 100%   Height: 5\' 10"  (1.778 m)    General: A/O x4, in no acute distress, afebrile, nondiaphoretic  MSK: LL BKA, RL leg nontender nonedematous   Assessment & Plan:   See Encounters Tab for problem based charting.  Patient seen with Dr. Lynnae January

## 2018-03-12 NOTE — Progress Notes (Signed)
Office Visit Note   Patient: [name] [name]           Date of Birth: Sep 03, 1975           MRN: [id number] Visit Date: 03/12/2018              Requested by: Ina Homes, MD [address] [place], [place] [address] PCP: Ina Homes, MD  Chief Complaint  Patient presents with  . Left Leg - Follow-up    BKA dynaflex wrap      HPI: The patient is a 42 year old female who presents for follow-up of her left below the knee amputation.  She had massive lymphedema and we have been treating her with Dynaflex compression wraps.  Her swelling has dramatically improved however she could only tolerate the Dynaflex for about 3 to 4 days and then she states she removed it to take a shower.  She has been wrapping the residual limb with Ace wrapping following removal of the Dynaflex.  She is reporting significant itching over the residual limb and we counseled that she could use over-the-counter hydrocortisone cream.  She does have chronic pain issues related to her amputation and she has not yet heard from her pain clinic referral.  She does continue to smoke.  Assessment & Plan: Visit Diagnoses:  1. Status post below knee amputation, left (HCC)   2. Lymphedema of left lower extremity   3. Type 2 diabetes mellitus with diabetic polyneuropathy, unspecified whether long term insulin use ([place])   4. Morbid obesity due to excess calories ([place])   5. CKD (chronic kidney disease) stage 2, GFR 60-89 ml/min   6. Severe protein-calorie malnutrition ([place])     Plan: Patient was given a prescription for [facility] to work on making her a custom stump shrinker stocking.  She reports poor fitting of the prosthetic socket even prior to her edema improving and she is going to follow-up with [facility] concerning her prosthetic fitting as well.  We recommended hydrocortisone cream 0.1% over-the-counter to the stump daily to try to help with some of the itching symptoms.  We did refill her Percocet 5/325  mg #28 no refill and have recommended that she continue to pursue pain clinic.  We are working on the pain clinic referral for her.  She will follow-up here in 2 weeks or sooner should she have difficulties in the interim.  She  Follow-Up Instructions: Return in about 2 weeks (around 03/26/2018).   Ortho Exam  Patient is alert, oriented, no adenopathy, well-dressed, normal affect, normal respiratory effort. The left transtibial amputation site is markedly improved with significant improvement in her edema.  There is no ulceration.  There is no weeping.  There is no signs of cellulitis.  Imaging: No results found. No images are attached to the encounter.  Labs: Lab Results  Component Value Date   HGBA1C 11.4 (A) 11/30/2017   HGBA1C 9.6 09/07/2017   HGBA1C 6.5 (H) 06/09/2017   ESRSEDRATE 130 (H) 03/24/2017   ESRSEDRATE 53 (H) 09/14/2015   ESRSEDRATE 132 (H) 07/06/2012   CRP 5.4 (H) 09/14/2015   CRP 23.0 (H) 07/06/2012   REPTSTATUS 08/13/2017 FINAL 08/11/2017   GRAMSTAIN  08/11/2017    MODERATE WBC PRESENT, PREDOMINANTLY PMN FEW GRAM POSITIVE COCCI    CULT  08/11/2017    ABUNDANT GROUP B STREP(S.AGALACTIAE)ISOLATED TESTING AGAINST S. AGALACTIAE NOT ROUTINELY PERFORMED DUE TO PREDICTABILITY OF AMP/PEN/VAN SUSCEPTIBILITY. Performed at [facility], [place] [address], [place], [place]  [place] 06/09/2017     Lab Results  Component Value Date   ALBUMIN 1.9 (L) 08/11/2017   ALBUMIN 2.9 (L) 06/09/2017   ALBUMIN 2.3 (L) 04/12/2017    Body mass index is 47.06 kg/m.  Orders:  No orders of the defined types were placed in this encounter.  Meds ordered this encounter  Medications  . oxyCODONE-acetaminophen (PERCOCET/ROXICET) 5-325 MG tablet    Sig: Take 1 tablet by mouth every 4 (four) hours as needed for severe pain.    Dispense:  30 tablet    Refill:  0     Procedures: No procedures performed  Clinical Data: No additional  findings.  ROS:  All other systems negative, except as noted in the HPI. Review of Systems  Objective: Vital Signs: Ht 5\' 10"  (1.778 m)   Wt (!) 328 lb (148.8 kg)   BMI 47.06 kg/m   Specialty Comments:  No specialty comments available.  PMFS History: Patient Active Problem List   Diagnosis Date Noted  . Herpes genitalis in women 01/31/2018  . Herpes simplex type 1 infection 01/16/2018  . Dehiscence of amputation stump ([place])   . Status post below knee amputation, left ([place]) 04/11/2017  . Bipolar affective disorder ([place])   . Adjustment disorder with depressed mood 03/26/2017  . Abnormal ECG 03/24/2017  . Heart failure with preserved ejection fraction ([place]) 02/09/2017  . Routine adult health maintenance 12/08/2016  . Chronic pain of right knee 09/08/2016  . CKD (chronic kidney disease) stage 2, GFR 60-89 ml/min 07/15/2016  . OSA (obstructive sleep apnea) 05/13/2016  . Morbid obesity due to excess calories ([place]) 11/27/2015  . History of Low serum cortisol level ([place]) 01/07/2015  . Hypothyroidism 04/25/2013  . Financial difficulty 04/25/2013  . Uncontrolled type 2 diabetes mellitus ([place]) 07/12/2012  . Necrotizing fasciitis s/p OR debridements 07/06/2012  . Carpal tunnel syndrome, bilateral 01/25/2012  . Diabetic peripheral neuropathy ([place]) 07/04/2011  . Anxiety and depression 06/02/2011  . History of abnormal cervical Pap smear 12/13/2007  . GERD 12/06/2007  . Hyperlipidemia 08/29/2007  . Hypertension associated with diabetes ([place]) 08/29/2007   Past Medical History:  Diagnosis Date  . Abdominal muscle pain 09/08/2016  . Abnormal Pap smear of cervix 2009  . Abscess    history of multiple abscesses  . Acute bilateral low back pain 02/13/2017  . Acute blood loss anemia   . Anemia of chronic disease 2002  . Anxiety    Panic attacks  . Bilateral lower extremity edema 05/13/2016  . Bipolar disorder ([place])   . Cellulitis 05/21/2014   right eye  . Chronic bronchitis ([place])     "get it q yr" (05/13/2013)  . Chronic pain   . Depression   . Edema of lower extremity   . Endocarditis 2002   subacute bacterial endocarditis.   . Family history of anesthesia complication    "my mom has a hard time coming out from under"  . Fibromyalgia   . GERD (gastroesophageal reflux disease)    occ  . Heart murmur   . History of blood transfusion    "just low blood count" (05/13/2013)  . Hyperlipidemia   . Hypertension   . Hypothyroidism   . Hypothyroidism, adult 03/21/2014  . Leukocytosis   . Necrosis ([place])    and ulceration  . Obesity   . OSA on CPAP    does not wear CPAP  . Peripheral neuropathy   . Type II diabetes mellitus ([place])  Type  II    Family History  Problem Relation Age of Onset  . Heart failure Mother   . Diabetes Mother   . Kidney disease Mother   . Kidney disease Father   . Diabetes Father   . Diabetes Paternal Grandmother   . Heart failure Paternal Grandmother   . Other Other     Past Surgical History:  Procedure Laterality Date  . AMPUTATION Left 04/07/2017   Procedure: LEFT BELOW KNEE AMPUTATION;  Surgeon: Newt Minion, MD;  Location: [facility];  Service: Orthopedics;  Laterality: Left;  . EYE SURGERY     lazer  . INCISION AND DRAINAGE ABSCESS     multiple I&Ds  . INCISION AND DRAINAGE ABSCESS Left 07/09/2012   Procedure: DRESSING CHANGE, THIGH WOUND;  Surgeon: Harl Bowie, MD;  Location: [facility];  Service: General;  Laterality: Left;  . INCISION AND DRAINAGE OF WOUND Left 07/07/2012   Procedure: IRRIGATION AND DEBRIDEMENT WOUND;  Surgeon: Harl Bowie, MD;  Location: [facility];  Service: General;  Laterality: Left;  . INCISION AND DRAINAGE PERIRECTAL ABSCESS Left 07/14/2012   Procedure: DEBRIDEMENT OF SKIN & SOFT TISSUE; DRESSING CHANGE UNDER ANESTHESIA;  Surgeon: Gayland Curry, MD,FACS;  Location: [facility];  Service: General;  Laterality: Left;  . INCISION AND DRAINAGE PERIRECTAL ABSCESS Left 07/16/2012   Procedure: I&D Left Thigh;   Surgeon: Gwenyth Ober, MD;  Location: [facility];  Service: General;  Laterality: Left;  . INCISION AND DRAINAGE PERIRECTAL ABSCESS N/A 01/05/2015   Procedure: IRRIGATION AND DEBRIDEMENT PERIRECTAL ABSCESS;  Surgeon: Donnie Mesa, MD;  Location: [facility];  Service: General;  Laterality: N/A;  . IRRIGATION AND DEBRIDEMENT ABSCESS Left 07/06/2012   Procedure: IRRIGATION AND DEBRIDEMENT ABSCESS BUTTOCKS AND THIGH;  Surgeon: Shann Medal, MD;  Location: [facility];  Service: General;  Laterality: Left;  . IRRIGATION AND DEBRIDEMENT ABSCESS Left 08/10/2012   Procedure: IRRIGATION AND DEBRIDEMENT ABSCESS;  Surgeon: Madilyn Hook, DO;  Location: [facility];  Service: General;  Laterality: Left;  . STUMP REVISION Left 06/09/2017   Procedure: REVISION LEFT BELOW KNEE AMPUTATION;  Surgeon: Newt Minion, MD;  Location: [facility];  Service: Orthopedics;  Laterality: Left;  . TONSILLECTOMY  1994   Social History   Occupational History  . Occupation: disability  Tobacco Use  . Smoking status: Current Every Day Smoker    Packs/day: 0.40    Years: 15.00    Pack years: 6.00    Types: Cigarettes  . Smokeless tobacco: Never Used  . Tobacco comment: 4 per day   Substance and Sexual Activity  . Alcohol use: Yes    Alcohol/week: 0.0 standard drinks    Comment: Socially-" monthy  maybe"  . Drug use: No  . Sexual activity: Yes

## 2018-03-12 NOTE — Patient Instructions (Signed)
FOLLOW-UP INSTRUCTIONS When: If your symptoms worsen or fail to improve What to bring: All of your medications  I have made changes to your medications today. I have added fluconazole oral and nystatin powder. Please take one fluconazole tablet every three days or 72 hours apart. Please apply the nystatin powder to the affected area at least four times daily after cleaning and drying the area.  Thank you for your visit to the [facility] today. If you have any questions or concerns please call us at [phone].

## 2018-03-13 NOTE — Assessment & Plan Note (Signed)
Vaginal candidiasis: Patient stated that since being treated for vaginal HSV-1 (positive culture and serotype testing) with 10 days of famciclovir and initial symptom improvement. Patient stated that she was also treated with an antibiotic at an urgent care center the name of which she does not recall and the antibiotic I can not identify a recently prescribed antibiotic in the chart. She is concerned for >1.5 months of progressively worsening burning vaginal pain, irritation, which has spread to her inner/upper thighs and perianal zone. She has not attempted topical treatment and it has not improved. Pain is worsened with urination.  Physical exam revealed skin breakdown and irritation with an erythematous/ulcerated base, of the entire vaginal area, as well as the perianal area, inner thighs, abdominal panus fold and groin. This area is absent vesicular lesions, and whitish plaques generally but areas of white plaques are noted in the folds with ulcerated bases.  This is most consistent with a severe topical vaginal candidiasis infection following recent antibiotic use and herpes flare in the setting of poorly controlled diabetes.   Plan: We will order fluconazle 150mg  Q72 hours for three doses to complete a 9 day treatment course We will order Nystatin topical powder  She is to return if symptoms worsen or fail to improve in the next 10 days She will likely need a referral to OB/GYN if her symptoms persist and would benefit form more tightly controlled diabetes

## 2018-03-14 NOTE — Progress Notes (Signed)
Internal Medicine Clinic Attending  I saw and evaluated the patient.  I personally confirmed the key portions of the history and exam documented by Dr. Harbrecht and I reviewed pertinent patient test results.  The assessment, diagnosis, and plan were formulated together and I agree with the documentation in the resident's note.  

## 2018-03-22 ENCOUNTER — Telehealth (INDEPENDENT_AMBULATORY_CARE_PROVIDER_SITE_OTHER): Payer: Self-pay | Admitting: Orthopedic Surgery

## 2018-03-22 NOTE — Telephone Encounter (Signed)
Jasmine at [facility] states patient received a custom compression and she has lost the rx for it, Delana Meyer is asking this be faxed to her at  Fax # [phone] Phone # [phone]

## 2018-03-23 ENCOUNTER — Other Ambulatory Visit (INDEPENDENT_AMBULATORY_CARE_PROVIDER_SITE_OTHER): Payer: Self-pay

## 2018-03-23 NOTE — Telephone Encounter (Signed)
Order faxed to [facility] for Left BKA custom shrinker.

## 2018-03-26 ENCOUNTER — Ambulatory Visit (INDEPENDENT_AMBULATORY_CARE_PROVIDER_SITE_OTHER): Payer: Medicare Other | Admitting: Orthopedic Surgery

## 2018-03-28 ENCOUNTER — Encounter (INDEPENDENT_AMBULATORY_CARE_PROVIDER_SITE_OTHER): Payer: Self-pay | Admitting: Family

## 2018-03-28 ENCOUNTER — Ambulatory Visit (INDEPENDENT_AMBULATORY_CARE_PROVIDER_SITE_OTHER): Payer: Medicare Other | Admitting: Family

## 2018-03-28 VITALS — Ht 70.0 in | Wt 328.0 lb

## 2018-03-28 DIAGNOSIS — Z89512 Acquired absence of left leg below knee: Secondary | ICD-10-CM | POA: Diagnosis not present

## 2018-03-28 DIAGNOSIS — S88112S Complete traumatic amputation at level between knee and ankle, left lower leg, sequela: Secondary | ICD-10-CM

## 2018-03-28 MED ORDER — OXYCODONE-ACETAMINOPHEN 5-325 MG PO TABS: 1.0000 | ORAL_TABLET | Freq: Four times a day (QID) | ORAL | 0 refills | Status: DC | PRN

## 2018-03-28 NOTE — Progress Notes (Signed)
Office Visit Note   Patient: [name] [name]           Date of Birth: [birth year]           MRN: [id number] Visit Date: 03/28/2018              Requested by: Ina Homes, MD [address] [place], [place] [address] PCP: Ina Homes, MD  Chief Complaint  Patient presents with  . Left Leg - Follow-up    Prosthetic eval      HPI: The patient is a 42 year old female who presents today for prosthetic evaluation. She has had ongoing issues with callus and blisters from prosthesis wear.   She does have chronic pain issues related to her amputation. Has her first appointment next week with pain management.   Assessment & Plan: Visit Diagnoses:  1. Complete below knee amputation of lower extremity, left, sequela ([place])   2. Status post below knee amputation, left ([place])     Plan: minimize weight bearing on left until healed. Begin wearing shrinker once obtained.  Patient was given a prescription for [facility] for prosthesis supplies. She is to follow-up with [facility] concerning her prosthetic fitting as well.    We did refill her Percocet 5/325 mg. Last prescription sh ewill begin following with a pain clinic.    Follow-Up Instructions: Return if symptoms worsen or fail to improve.   Ortho Exam  Patient is alert, oriented, no adenopathy, well-dressed, normal affect, normal respiratory effort. The left transtibial amputation site has 2 5 mm in diameter ulcerations medially with weeping. Trace edema. Can don prosthesis today. No bleeding, no erythema or warmth. There is no signs of cellulitis. Thickened callus to distal limb.   Imaging: No results found. No images are attached to the encounter.  Labs: Lab Results  Component Value Date   HGBA1C 11.4 (A) 11/30/2017   HGBA1C 9.6 09/07/2017   HGBA1C 6.5 (H) 06/09/2017   ESRSEDRATE 130 (H) 03/24/2017   ESRSEDRATE 53 (H) 09/14/2015   ESRSEDRATE 132 (H) 07/06/2012   CRP 5.4 (H) 09/14/2015   CRP 23.0 (H) 07/06/2012     REPTSTATUS 08/13/2017 FINAL 08/11/2017   GRAMSTAIN  08/11/2017    MODERATE WBC PRESENT, PREDOMINANTLY PMN FEW GRAM POSITIVE COCCI    CULT  08/11/2017    ABUNDANT GROUP B STREP(S.AGALACTIAE)ISOLATED TESTING AGAINST S. AGALACTIAE NOT ROUTINELY PERFORMED DUE TO PREDICTABILITY OF AMP/PEN/VAN SUSCEPTIBILITY. Performed at [facility], [place] [address] [place], [place] [address]    LABORGA ENTEROCOCCUS FAECALIS 06/09/2017     Lab Results  Component Value Date   ALBUMIN 1.9 (L) 08/11/2017   ALBUMIN 2.9 (L) 06/09/2017   ALBUMIN 2.3 (L) 04/12/2017    Body mass index is 47.06 kg/m.  Orders:  No orders of the defined types were placed in this encounter.  Meds ordered this encounter  Medications  . oxyCODONE-acetaminophen (PERCOCET/ROXICET) 5-325 MG tablet    Sig: Take 1 tablet by mouth every 6 (six) hours as needed for severe pain.    Dispense:  30 tablet    Refill:  0     Procedures: No procedures performed  Clinical Data: No additional findings.  ROS:  All other systems negative, except as noted in the HPI. Review of Systems  Objective: Vital Signs: Ht 5\' 10"  (1.778 m)   Wt (!) 328 lb (148.8 kg)   BMI 47.06 kg/m   Specialty Comments:  No specialty comments available.  PMFS History: Patient Active Problem List  Diagnosis Date Noted  . Vaginal candidiasis 03/12/2018  . Herpes genitalis in women 01/31/2018  . Herpes simplex type 1 infection 01/16/2018  . Status post below knee amputation, left ([place]) 04/11/2017  . Bipolar affective disorder ([place])   . Adjustment disorder with depressed mood 03/26/2017  . Abnormal ECG 03/24/2017  . Heart failure with preserved ejection fraction ([place]) 02/09/2017  . Routine adult health maintenance 12/08/2016  . Chronic pain of right knee 09/08/2016  . CKD (chronic kidney disease) stage 2, GFR 60-89 ml/min 07/15/2016  . OSA (obstructive sleep apnea) 05/13/2016  . Morbid obesity due to excess calories ([place]) 11/27/2015  .  History of Low serum cortisol level ([place]) 01/07/2015  . Hypothyroidism 04/25/2013  . Financial difficulty 04/25/2013  . Uncontrolled type 2 diabetes mellitus ([place]) 07/12/2012  . Necrotizing fasciitis s/p OR debridements 07/06/2012  . Carpal tunnel syndrome, bilateral 01/25/2012  . Diabetic peripheral neuropathy ([place]) 07/04/2011  . Anxiety and depression 06/02/2011  . History of abnormal cervical Pap smear 12/13/2007  . GERD 12/06/2007  . Hyperlipidemia 08/29/2007  . Hypertension associated with diabetes ([place]) 08/29/2007   Past Medical History:  Diagnosis Date  . Abdominal muscle pain 09/08/2016  . Abnormal Pap smear of cervix 2009  . Abscess    history of multiple abscesses  . Acute bilateral low back pain 02/13/2017  . Acute blood loss anemia   . Anemia of chronic disease 2002  . Anxiety    Panic attacks  . Bilateral lower extremity edema 05/13/2016  . Bipolar disorder ([place])   . Cellulitis 05/21/2014   right eye  . Chronic bronchitis ([place])    "get it q yr" (05/13/2013)  . Chronic pain   . Depression   . Edema of lower extremity   . Endocarditis 2002   subacute bacterial endocarditis.   . Family history of anesthesia complication    "my mom has a hard time coming out from under"  . Fibromyalgia   . GERD (gastroesophageal reflux disease)    occ  . Heart murmur   . History of blood transfusion    "just low blood count" (05/13/2013)  . Hyperlipidemia   . Hypertension   . Hypothyroidism   . Hypothyroidism, adult 03/21/2014  . Leukocytosis   . Necrosis ([place])    and ulceration  . Obesity   . OSA on CPAP    does not wear CPAP  . Peripheral neuropathy   . Type II diabetes mellitus (HCC)    Type  II    Family History  Problem Relation Age of Onset  . Heart failure Mother   . Diabetes Mother   . Kidney disease Mother   . Kidney disease Father   . Diabetes Father   . Diabetes Paternal Grandmother   . Heart failure Paternal Grandmother   . Other Other     Past  Surgical History:  Procedure Laterality Date  . AMPUTATION Left 04/07/2017   Procedure: LEFT BELOW KNEE AMPUTATION;  Surgeon: Newt Minion, MD;  Location: [facility];  Service: Orthopedics;  Laterality: Left;  . EYE SURGERY     lazer  . INCISION AND DRAINAGE ABSCESS     multiple I&Ds  . INCISION AND DRAINAGE ABSCESS Left 07/09/2012   Procedure: DRESSING CHANGE, THIGH WOUND;  Surgeon: Harl Bowie, MD;  Location: [facility];  Service: General;  Laterality: Left;  . INCISION AND DRAINAGE OF WOUND Left 07/07/2012   Procedure: IRRIGATION AND DEBRIDEMENT WOUND;  Surgeon: Harl Bowie, MD;  Location:  [facility];  Service: General;  Laterality: Left;  . INCISION AND DRAINAGE PERIRECTAL ABSCESS Left 07/14/2012   Procedure: DEBRIDEMENT OF SKIN & SOFT TISSUE; DRESSING CHANGE UNDER ANESTHESIA;  Surgeon: Gayland Curry, MD,FACS;  Location: [facility];  Service: General;  Laterality: Left;  . INCISION AND DRAINAGE PERIRECTAL ABSCESS Left 07/16/2012   Procedure: I&D Left Thigh;  Surgeon: Gwenyth Ober, MD;  Location: [facility];  Service: General;  Laterality: Left;  . INCISION AND DRAINAGE PERIRECTAL ABSCESS N/A 01/05/2015   Procedure: IRRIGATION AND DEBRIDEMENT PERIRECTAL ABSCESS;  Surgeon: Donnie Mesa, MD;  Location: [facility];  Service: General;  Laterality: N/A;  . IRRIGATION AND DEBRIDEMENT ABSCESS Left 07/06/2012   Procedure: IRRIGATION AND DEBRIDEMENT ABSCESS BUTTOCKS AND THIGH;  Surgeon: Shann Medal, MD;  Location: [facility];  Service: General;  Laterality: Left;  . IRRIGATION AND DEBRIDEMENT ABSCESS Left 08/10/2012   Procedure: IRRIGATION AND DEBRIDEMENT ABSCESS;  Surgeon: Madilyn Hook, DO;  Location: [facility];  Service: General;  Laterality: Left;  . STUMP REVISION Left 06/09/2017   Procedure: REVISION LEFT BELOW KNEE AMPUTATION;  Surgeon: Newt Minion, MD;  Location: [facility];  Service: Orthopedics;  Laterality: Left;  . TONSILLECTOMY  1994   Social History   Occupational History  . Occupation: disability  Tobacco  Use  . Smoking status: Current Every Day Smoker    Packs/day: 0.40    Years: 15.00    Pack years: 6.00    Types: Cigarettes  . Smokeless tobacco: Never Used  . Tobacco comment: 4 per day   Substance and Sexual Activity  . Alcohol use: Yes    Alcohol/week: 0.0 standard drinks    Comment: Socially-" monthy  maybe"  . Drug use: No  . Sexual activity: Yes

## 2018-04-03 ENCOUNTER — Inpatient Hospital Stay (HOSPITAL_COMMUNITY)
Admission: EM | Admit: 2018-04-03 | Discharge: 2018-04-10 | DRG: 564 | Disposition: A | Discharge status: Rehabilitation Facility | Payer: Medicare Other | Attending: Oncology | Admitting: Oncology

## 2018-04-03 ENCOUNTER — Emergency Department (HOSPITAL_COMMUNITY): Payer: Medicare Other

## 2018-04-03 ENCOUNTER — Ambulatory Visit (INDEPENDENT_AMBULATORY_CARE_PROVIDER_SITE_OTHER): Payer: Self-pay | Admitting: Physician Assistant

## 2018-04-03 ENCOUNTER — Inpatient Hospital Stay (HOSPITAL_COMMUNITY): Payer: Medicare Other

## 2018-04-03 ENCOUNTER — Other Ambulatory Visit: Payer: Self-pay

## 2018-04-03 ENCOUNTER — Encounter (HOSPITAL_COMMUNITY): Payer: Self-pay | Admitting: Emergency Medicine

## 2018-04-03 DIAGNOSIS — T8744 Infection of amputation stump, left lower extremity: Secondary | ICD-10-CM | POA: Diagnosis present

## 2018-04-03 DIAGNOSIS — G8929 Other chronic pain: Secondary | ICD-10-CM | POA: Diagnosis present

## 2018-04-03 DIAGNOSIS — F17200 Nicotine dependence, unspecified, uncomplicated: Secondary | ICD-10-CM | POA: Diagnosis not present

## 2018-04-03 DIAGNOSIS — F17210 Nicotine dependence, cigarettes, uncomplicated: Secondary | ICD-10-CM | POA: Diagnosis present

## 2018-04-03 DIAGNOSIS — E039 Hypothyroidism, unspecified: Secondary | ICD-10-CM | POA: Diagnosis present

## 2018-04-03 DIAGNOSIS — M797 Fibromyalgia: Secondary | ICD-10-CM | POA: Diagnosis present

## 2018-04-03 DIAGNOSIS — E1169 Type 2 diabetes mellitus with other specified complication: Secondary | ICD-10-CM | POA: Diagnosis not present

## 2018-04-03 DIAGNOSIS — E669 Obesity, unspecified: Secondary | ICD-10-CM | POA: Diagnosis not present

## 2018-04-03 DIAGNOSIS — E785 Hyperlipidemia, unspecified: Secondary | ICD-10-CM | POA: Diagnosis present

## 2018-04-03 DIAGNOSIS — E11649 Type 2 diabetes mellitus with hypoglycemia without coma: Secondary | ICD-10-CM | POA: Diagnosis not present

## 2018-04-03 DIAGNOSIS — E1142 Type 2 diabetes mellitus with diabetic polyneuropathy: Secondary | ICD-10-CM | POA: Diagnosis present

## 2018-04-03 DIAGNOSIS — N170 Acute kidney failure with tubular necrosis: Secondary | ICD-10-CM | POA: Diagnosis present

## 2018-04-03 DIAGNOSIS — A419 Sepsis, unspecified organism: Secondary | ICD-10-CM | POA: Diagnosis present

## 2018-04-03 DIAGNOSIS — Z872 Personal history of diseases of the skin and subcutaneous tissue: Secondary | ICD-10-CM | POA: Diagnosis not present

## 2018-04-03 DIAGNOSIS — L03116 Cellulitis of left lower limb: Secondary | ICD-10-CM | POA: Diagnosis present

## 2018-04-03 DIAGNOSIS — Z89512 Acquired absence of left leg below knee: Secondary | ICD-10-CM

## 2018-04-03 DIAGNOSIS — Z993 Dependence on wheelchair: Secondary | ICD-10-CM | POA: Diagnosis not present

## 2018-04-03 DIAGNOSIS — E118 Type 2 diabetes mellitus with unspecified complications: Secondary | ICD-10-CM | POA: Diagnosis not present

## 2018-04-03 DIAGNOSIS — D72829 Elevated white blood cell count, unspecified: Secondary | ICD-10-CM | POA: Diagnosis not present

## 2018-04-03 DIAGNOSIS — I878 Other specified disorders of veins: Secondary | ICD-10-CM | POA: Diagnosis not present

## 2018-04-03 DIAGNOSIS — E43 Unspecified severe protein-calorie malnutrition: Secondary | ICD-10-CM | POA: Diagnosis present

## 2018-04-03 DIAGNOSIS — F410 Panic disorder [episodic paroxysmal anxiety] without agoraphobia: Secondary | ICD-10-CM | POA: Diagnosis present

## 2018-04-03 DIAGNOSIS — Z9989 Dependence on other enabling machines and devices: Secondary | ICD-10-CM

## 2018-04-03 DIAGNOSIS — A401 Sepsis due to streptococcus, group B: Secondary | ICD-10-CM | POA: Diagnosis present

## 2018-04-03 DIAGNOSIS — R5381 Other malaise: Secondary | ICD-10-CM | POA: Diagnosis not present

## 2018-04-03 DIAGNOSIS — Z833 Family history of diabetes mellitus: Secondary | ICD-10-CM

## 2018-04-03 DIAGNOSIS — I5033 Acute on chronic diastolic (congestive) heart failure: Secondary | ICD-10-CM | POA: Diagnosis present

## 2018-04-03 DIAGNOSIS — D638 Anemia in other chronic diseases classified elsewhere: Secondary | ICD-10-CM | POA: Diagnosis present

## 2018-04-03 DIAGNOSIS — E1140 Type 2 diabetes mellitus with diabetic neuropathy, unspecified: Secondary | ICD-10-CM | POA: Diagnosis not present

## 2018-04-03 DIAGNOSIS — F321 Major depressive disorder, single episode, moderate: Secondary | ICD-10-CM | POA: Diagnosis not present

## 2018-04-03 DIAGNOSIS — E1122 Type 2 diabetes mellitus with diabetic chronic kidney disease: Secondary | ICD-10-CM | POA: Diagnosis present

## 2018-04-03 DIAGNOSIS — E119 Type 2 diabetes mellitus without complications: Secondary | ICD-10-CM | POA: Diagnosis present

## 2018-04-03 DIAGNOSIS — E871 Hypo-osmolality and hyponatremia: Secondary | ICD-10-CM | POA: Diagnosis not present

## 2018-04-03 DIAGNOSIS — G4733 Obstructive sleep apnea (adult) (pediatric): Secondary | ICD-10-CM | POA: Diagnosis present

## 2018-04-03 DIAGNOSIS — E1143 Type 2 diabetes mellitus with diabetic autonomic (poly)neuropathy: Secondary | ICD-10-CM | POA: Diagnosis present

## 2018-04-03 DIAGNOSIS — Y838 Other surgical procedures as the cause of abnormal reaction of the patient, or of later complication, without mention of misadventure at the time of the procedure: Secondary | ICD-10-CM | POA: Diagnosis present

## 2018-04-03 DIAGNOSIS — Z9114 Patient's other noncompliance with medication regimen: Secondary | ICD-10-CM | POA: Diagnosis not present

## 2018-04-03 DIAGNOSIS — R110 Nausea: Secondary | ICD-10-CM | POA: Diagnosis not present

## 2018-04-03 DIAGNOSIS — E1151 Type 2 diabetes mellitus with diabetic peripheral angiopathy without gangrene: Secondary | ICD-10-CM | POA: Diagnosis present

## 2018-04-03 DIAGNOSIS — N186 End stage renal disease: Secondary | ICD-10-CM | POA: Diagnosis present

## 2018-04-03 DIAGNOSIS — F329 Major depressive disorder, single episode, unspecified: Secondary | ICD-10-CM | POA: Diagnosis not present

## 2018-04-03 DIAGNOSIS — E1110 Type 2 diabetes mellitus with ketoacidosis without coma: Secondary | ICD-10-CM

## 2018-04-03 DIAGNOSIS — I5030 Unspecified diastolic (congestive) heart failure: Secondary | ICD-10-CM | POA: Diagnosis not present

## 2018-04-03 DIAGNOSIS — R7881 Bacteremia: Secondary | ICD-10-CM | POA: Diagnosis not present

## 2018-04-03 DIAGNOSIS — D62 Acute posthemorrhagic anemia: Secondary | ICD-10-CM | POA: Diagnosis not present

## 2018-04-03 DIAGNOSIS — R739 Hyperglycemia, unspecified: Secondary | ICD-10-CM

## 2018-04-03 DIAGNOSIS — N179 Acute kidney failure, unspecified: Secondary | ICD-10-CM | POA: Diagnosis not present

## 2018-04-03 DIAGNOSIS — I10 Essential (primary) hypertension: Secondary | ICD-10-CM | POA: Diagnosis not present

## 2018-04-03 DIAGNOSIS — R0689 Other abnormalities of breathing: Secondary | ICD-10-CM | POA: Diagnosis not present

## 2018-04-03 DIAGNOSIS — R0902 Hypoxemia: Secondary | ICD-10-CM | POA: Diagnosis not present

## 2018-04-03 DIAGNOSIS — F3130 Bipolar disorder, current episode depressed, mild or moderate severity, unspecified: Secondary | ICD-10-CM | POA: Diagnosis not present

## 2018-04-03 DIAGNOSIS — Z6841 Body Mass Index (BMI) 40.0 and over, adult: Secondary | ICD-10-CM

## 2018-04-03 DIAGNOSIS — I130 Hypertensive heart and chronic kidney disease with heart failure and stage 1 through stage 4 chronic kidney disease, or unspecified chronic kidney disease: Secondary | ICD-10-CM | POA: Diagnosis present

## 2018-04-03 DIAGNOSIS — M60004 Infective myositis, unspecified left leg: Secondary | ICD-10-CM | POA: Diagnosis not present

## 2018-04-03 DIAGNOSIS — Z794 Long term (current) use of insulin: Secondary | ICD-10-CM

## 2018-04-03 DIAGNOSIS — Z8249 Family history of ischemic heart disease and other diseases of the circulatory system: Secondary | ICD-10-CM

## 2018-04-03 DIAGNOSIS — D649 Anemia, unspecified: Secondary | ICD-10-CM | POA: Diagnosis not present

## 2018-04-03 DIAGNOSIS — E861 Hypovolemia: Secondary | ICD-10-CM | POA: Diagnosis present

## 2018-04-03 DIAGNOSIS — M549 Dorsalgia, unspecified: Secondary | ICD-10-CM | POA: Diagnosis not present

## 2018-04-03 DIAGNOSIS — N183 Chronic kidney disease, stage 3 (moderate): Secondary | ICD-10-CM | POA: Diagnosis present

## 2018-04-03 DIAGNOSIS — T8789 Other complications of amputation stump: Secondary | ICD-10-CM | POA: Diagnosis present

## 2018-04-03 DIAGNOSIS — T879 Unspecified complications of amputation stump: Secondary | ICD-10-CM | POA: Diagnosis not present

## 2018-04-03 DIAGNOSIS — R609 Edema, unspecified: Secondary | ICD-10-CM | POA: Diagnosis not present

## 2018-04-03 DIAGNOSIS — B951 Streptococcus, group B, as the cause of diseases classified elsewhere: Secondary | ICD-10-CM | POA: Diagnosis not present

## 2018-04-03 DIAGNOSIS — F339 Major depressive disorder, recurrent, unspecified: Secondary | ICD-10-CM | POA: Diagnosis not present

## 2018-04-03 DIAGNOSIS — E1165 Type 2 diabetes mellitus with hyperglycemia: Secondary | ICD-10-CM | POA: Diagnosis not present

## 2018-04-03 DIAGNOSIS — Z881 Allergy status to other antibiotic agents status: Secondary | ICD-10-CM

## 2018-04-03 DIAGNOSIS — B009 Herpesviral infection, unspecified: Secondary | ICD-10-CM | POA: Diagnosis present

## 2018-04-03 DIAGNOSIS — R072 Precordial pain: Secondary | ICD-10-CM | POA: Diagnosis not present

## 2018-04-03 DIAGNOSIS — Z841 Family history of disorders of kidney and ureter: Secondary | ICD-10-CM

## 2018-04-03 DIAGNOSIS — K219 Gastro-esophageal reflux disease without esophagitis: Secondary | ICD-10-CM | POA: Diagnosis present

## 2018-04-03 DIAGNOSIS — F319 Bipolar disorder, unspecified: Secondary | ICD-10-CM | POA: Diagnosis present

## 2018-04-03 DIAGNOSIS — Z79899 Other long term (current) drug therapy: Secondary | ICD-10-CM

## 2018-04-03 DIAGNOSIS — E875 Hyperkalemia: Secondary | ICD-10-CM | POA: Diagnosis not present

## 2018-04-03 DIAGNOSIS — E162 Hypoglycemia, unspecified: Secondary | ICD-10-CM | POA: Diagnosis not present

## 2018-04-03 DIAGNOSIS — D631 Anemia in chronic kidney disease: Secondary | ICD-10-CM | POA: Diagnosis not present

## 2018-04-03 DIAGNOSIS — E860 Dehydration: Secondary | ICD-10-CM | POA: Diagnosis present

## 2018-04-03 DIAGNOSIS — K3184 Gastroparesis: Secondary | ICD-10-CM | POA: Diagnosis present

## 2018-04-03 DIAGNOSIS — R600 Localized edema: Secondary | ICD-10-CM | POA: Diagnosis not present

## 2018-04-03 DIAGNOSIS — Z992 Dependence on renal dialysis: Secondary | ICD-10-CM | POA: Diagnosis present

## 2018-04-03 DIAGNOSIS — R000 Tachycardia, unspecified: Secondary | ICD-10-CM | POA: Diagnosis not present

## 2018-04-03 DIAGNOSIS — I5032 Chronic diastolic (congestive) heart failure: Secondary | ICD-10-CM | POA: Diagnosis not present

## 2018-04-03 LAB — BLOOD CULTURE ID PANEL (REFLEXED)
Acinetobacter baumannii: NOT DETECTED
CANDIDA ALBICANS: NOT DETECTED
CANDIDA GLABRATA: NOT DETECTED
CANDIDA PARAPSILOSIS: NOT DETECTED
CANDIDA TROPICALIS: NOT DETECTED
Candida krusei: NOT DETECTED
ENTEROBACTER CLOACAE COMPLEX: NOT DETECTED
ENTEROBACTERIACEAE SPECIES: NOT DETECTED
ENTEROCOCCUS SPECIES: NOT DETECTED
Escherichia coli: NOT DETECTED
Haemophilus influenzae: NOT DETECTED
KLEBSIELLA OXYTOCA: NOT DETECTED
KLEBSIELLA PNEUMONIAE: NOT DETECTED
LISTERIA MONOCYTOGENES: NOT DETECTED
NEISSERIA MENINGITIDIS: NOT DETECTED
Proteus species: NOT DETECTED
Pseudomonas aeruginosa: NOT DETECTED
STREPTOCOCCUS AGALACTIAE: DETECTED — AB
STREPTOCOCCUS PYOGENES: NOT DETECTED
STREPTOCOCCUS SPECIES: DETECTED — AB
Serratia marcescens: NOT DETECTED
Staphylococcus aureus (BCID): NOT DETECTED
Staphylococcus species: NOT DETECTED
Streptococcus pneumoniae: NOT DETECTED

## 2018-04-03 LAB — URINALYSIS, ROUTINE W REFLEX MICROSCOPIC
Bilirubin Urine: NEGATIVE
Glucose, UA: >=500 mg/dL — AB
Ketones, ur: 20 mg/dL — AB
LEUKOCYTES UA: NEGATIVE
Nitrite: NEGATIVE
PH: 6 (ref 5.0–8.0)
Protein, ur: >=300 mg/dL — AB
Specific Gravity, Urine: 1.021 (ref 1.005–1.030)

## 2018-04-03 LAB — CBG MONITORING, ED
GLUCOSE-CAPILLARY: 487 mg/dL — AB (ref 70–99)
GLUCOSE-CAPILLARY: 493 mg/dL — AB (ref 70–99)
GLUCOSE-CAPILLARY: 240 mg/dL — AB (ref 70–99)
GLUCOSE-CAPILLARY: 218 mg/dL — AB (ref 70–99)
Glucose-Capillary: 443 mg/dL — ABNORMAL HIGH (ref 70–99)
Glucose-Capillary: 323 mg/dL — ABNORMAL HIGH (ref 70–99)
Glucose-Capillary: 293 mg/dL — ABNORMAL HIGH (ref 70–99)
Glucose-Capillary: 191 mg/dL — ABNORMAL HIGH (ref 70–99)
Glucose-Capillary: 192 mg/dL — ABNORMAL HIGH (ref 70–99)

## 2018-04-03 LAB — COMPREHENSIVE METABOLIC PANEL
ALBUMIN: 1.6 g/dL — AB (ref 3.5–5.0)
ALT: 11 U/L (ref 0–44)
AST: 13 U/L — AB (ref 15–41)
Alkaline Phosphatase: 116 U/L (ref 38–126)
Anion gap: 14 (ref 5–15)
BILIRUBIN TOTAL: 1.3 mg/dL — AB (ref 0.3–1.2)
BUN: 17 mg/dL (ref 6–20)
CO2: 16 mmol/L — AB (ref 22–32)
Calcium: 8 mg/dL — ABNORMAL LOW (ref 8.9–10.3)
Chloride: 95 mmol/L — ABNORMAL LOW (ref 98–111)
Creatinine, Ser: 1.61 mg/dL — ABNORMAL HIGH (ref 0.44–1.00)
GFR calc Af Amer: 45 mL/min — ABNORMAL LOW (ref >60)
GFR calc non Af Amer: 39 mL/min — ABNORMAL LOW (ref >60)
GLUCOSE: 535 mg/dL — AB (ref 70–99)
POTASSIUM: 4.4 mmol/L (ref 3.5–5.1)
SODIUM: 125 mmol/L — AB (ref 135–145)
TOTAL PROTEIN: 6.3 g/dL — AB (ref 6.5–8.1)

## 2018-04-03 LAB — CBC WITH DIFFERENTIAL/PLATELET
Abs Immature Granulocytes: 0.07 10*3/uL (ref 0.00–0.07)
BASOS PCT: 0 %
Basophils Absolute: 0 10*3/uL (ref 0.0–0.1)
Eosinophils Absolute: 0 10*3/uL (ref 0.0–0.5)
Eosinophils Relative: 0 %
HEMATOCRIT: 32.5 % — AB (ref 36.0–46.0)
HEMOGLOBIN: 10 g/dL — AB (ref 12.0–15.0)
Immature Granulocytes: 1 %
LYMPHS ABS: 0.4 10*3/uL — AB (ref 0.7–4.0)
LYMPHS PCT: 6 %
MCH: 24.7 pg — ABNORMAL LOW (ref 26.0–34.0)
MCHC: 30.8 g/dL (ref 30.0–36.0)
MCV: 80.2 fL (ref 80.0–100.0)
MONO ABS: 0.4 10*3/uL (ref 0.1–1.0)
MONOS PCT: 6 %
Neutro Abs: 5.4 10*3/uL (ref 1.7–7.7)
Neutrophils Relative %: 87 %
Platelets: 207 10*3/uL (ref 150–400)
RBC: 4.05 MIL/uL (ref 3.87–5.11)
RDW: 14.3 % (ref 11.5–15.5)
WBC: 6.2 10*3/uL (ref 4.0–10.5)
nRBC: 0 % (ref 0.0–0.2)

## 2018-04-03 LAB — BASIC METABOLIC PANEL
Anion gap: 7 (ref 5–15)
BUN: 20 mg/dL (ref 6–20)
CHLORIDE: 103 mmol/L (ref 98–111)
CO2: 21 mmol/L — AB (ref 22–32)
CREATININE: 1.67 mg/dL — AB (ref 0.44–1.00)
Calcium: 8.2 mg/dL — ABNORMAL LOW (ref 8.9–10.3)
GFR calc non Af Amer: 37 mL/min — ABNORMAL LOW (ref >60)
GFR, EST AFRICAN AMERICAN: 43 mL/min — AB (ref >60)
Glucose, Bld: 182 mg/dL — ABNORMAL HIGH (ref 70–99)
Potassium: 4.1 mmol/L (ref 3.5–5.1)
Sodium: 131 mmol/L — ABNORMAL LOW (ref 135–145)

## 2018-04-03 LAB — C-REACTIVE PROTEIN: CRP: 38.6 mg/dL — ABNORMAL HIGH (ref <1.0)

## 2018-04-03 LAB — I-STAT CG4 LACTIC ACID, ED
Lactic Acid, Venous: 1.61 mmol/L (ref 0.5–1.9)
Lactic Acid, Venous: 1.13 mmol/L (ref 0.5–1.9)

## 2018-04-03 LAB — GLUCOSE, CAPILLARY: GLUCOSE-CAPILLARY: 246 mg/dL — AB (ref 70–99)

## 2018-04-03 LAB — INFLUENZA PANEL BY PCR (TYPE A & B)
INFLAPCR: NEGATIVE
Influenza B By PCR: NEGATIVE

## 2018-04-03 LAB — PROCALCITONIN: PROCALCITONIN: 5.53 ng/mL

## 2018-04-03 MED ORDER — INSULIN ASPART 100 UNIT/ML ~~LOC~~ SOLN
4.0000 [IU] | Freq: Three times a day (TID) | SUBCUTANEOUS | Status: DC
  Administered 2018-04-03 – 2018-04-04 (×2): 4 [IU] via SUBCUTANEOUS

## 2018-04-03 MED ORDER — CLINDAMYCIN PHOSPHATE 900 MG/50ML IV SOLN
900.0000 mg | Freq: Once | INTRAVENOUS | Status: AC
  Administered 2018-04-03: 900 mg via INTRAVENOUS
  Filled 2018-04-03: qty 50

## 2018-04-03 MED ORDER — INSULIN GLARGINE 100 UNIT/ML ~~LOC~~ SOLN
20.0000 [IU] | Freq: Once | SUBCUTANEOUS | Status: AC
  Administered 2018-04-03: 20 [IU] via SUBCUTANEOUS
  Filled 2018-04-03: qty 0.2

## 2018-04-03 MED ORDER — HYDROMORPHONE HCL 1 MG/ML IJ SOLN
0.5000 mg | INTRAMUSCULAR | Status: DC | PRN
  Administered 2018-04-03 (×2): 1 mg via INTRAVENOUS
  Filled 2018-04-03 (×2): qty 1

## 2018-04-03 MED ORDER — INSULIN ASPART 100 UNIT/ML ~~LOC~~ SOLN
0.0000 [IU] | Freq: Three times a day (TID) | SUBCUTANEOUS | Status: DC
  Administered 2018-04-03: 5 [IU] via SUBCUTANEOUS
  Administered 2018-04-03: 3 [IU] via SUBCUTANEOUS
  Administered 2018-04-04: 11 [IU] via SUBCUTANEOUS
  Administered 2018-04-04 – 2018-04-06 (×6): 5 [IU] via SUBCUTANEOUS
  Filled 2018-04-03 (×2): qty 1

## 2018-04-03 MED ORDER — SODIUM CHLORIDE 0.9 % IV SOLN
INTRAVENOUS | Status: DC
  Administered 2018-04-03 – 2018-04-10 (×4): via INTRAVENOUS

## 2018-04-03 MED ORDER — DEXTROSE-NACL 5-0.45 % IV SOLN
INTRAVENOUS | Status: DC
  Administered 2018-04-03: 11:00:00 via INTRAVENOUS

## 2018-04-03 MED ORDER — SODIUM CHLORIDE 0.9 % IV SOLN
900.0000 mg | Freq: Every day | INTRAVENOUS | Status: DC
  Administered 2018-04-04: 900 mg via INTRAVENOUS
  Filled 2018-04-03 (×2): qty 18

## 2018-04-03 MED ORDER — CLINDAMYCIN PHOSPHATE 900 MG/50ML IV SOLN
900.0000 mg | Freq: Three times a day (TID) | INTRAVENOUS | Status: DC
  Filled 2018-04-03: qty 50

## 2018-04-03 MED ORDER — INSULIN GLARGINE 100 UNIT/ML ~~LOC~~ SOLN: 20.0000 [IU] | Freq: Every day | SUBCUTANEOUS | Status: DC

## 2018-04-03 MED ORDER — ACETAMINOPHEN 325 MG PO TABS
650.0000 mg | ORAL_TABLET | Freq: Four times a day (QID) | ORAL | Status: DC | PRN
  Administered 2018-04-03 – 2018-04-05 (×4): 650 mg via ORAL
  Filled 2018-04-03 (×4): qty 2

## 2018-04-03 MED ORDER — SENNOSIDES-DOCUSATE SODIUM 8.6-50 MG PO TABS: 1.0000 | ORAL_TABLET | Freq: Every evening | ORAL | Status: DC | PRN

## 2018-04-03 MED ORDER — FLUOXETINE HCL 20 MG/5ML PO SOLN
60.0000 mg | Freq: Every day | ORAL | Status: DC
  Filled 2018-04-03 (×2): qty 15

## 2018-04-03 MED ORDER — ACETAMINOPHEN 500 MG PO TABS
1000.0000 mg | ORAL_TABLET | Freq: Once | ORAL | Status: AC
  Administered 2018-04-03: 1000 mg via ORAL
  Filled 2018-04-03: qty 2

## 2018-04-03 MED ORDER — SODIUM CHLORIDE 0.9 % IV SOLN
2.0000 g | Freq: Three times a day (TID) | INTRAVENOUS | Status: DC
  Administered 2018-04-03 – 2018-04-04 (×3): 2 g via INTRAVENOUS
  Filled 2018-04-03 (×5): qty 2

## 2018-04-03 MED ORDER — SODIUM CHLORIDE 0.9 % IV SOLN
INTRAVENOUS | Status: DC | PRN
  Administered 2018-04-03: 500 mL via INTRAVENOUS

## 2018-04-03 MED ORDER — LACTATED RINGERS IV BOLUS
1000.0000 mL | Freq: Once | INTRAVENOUS | Status: AC
  Administered 2018-04-03: 1000 mL via INTRAVENOUS

## 2018-04-03 MED ORDER — INSULIN ASPART 100 UNIT/ML ~~LOC~~ SOLN
0.0000 [IU] | Freq: Every day | SUBCUTANEOUS | Status: DC
  Administered 2018-04-03 – 2018-04-05 (×3): 2 [IU] via SUBCUTANEOUS

## 2018-04-03 MED ORDER — CHLORHEXIDINE GLUCONATE 4 % EX LIQD: 60.0000 mL | Freq: Once | CUTANEOUS | Status: DC

## 2018-04-03 MED ORDER — FLUOXETINE HCL 20 MG/5ML PO SOLN: 60.0000 mg | Freq: Every day | ORAL | Status: DC

## 2018-04-03 MED ORDER — MORPHINE SULFATE (PF) 4 MG/ML IV SOLN
4.0000 mg | Freq: Once | INTRAVENOUS | Status: AC
  Administered 2018-04-03: 4 mg via INTRAVENOUS
  Filled 2018-04-03: qty 1

## 2018-04-03 MED ORDER — ENOXAPARIN SODIUM 40 MG/0.4ML ~~LOC~~ SOLN
40.0000 mg | SUBCUTANEOUS | Status: DC
  Filled 2018-04-03: qty 0.4

## 2018-04-03 MED ORDER — SODIUM CHLORIDE 0.9 % IV SOLN
6.0000 mg/kg | Freq: Once | INTRAVENOUS | Status: AC
  Administered 2018-04-03: 893 mg via INTRAVENOUS
  Filled 2018-04-03: qty 17.86

## 2018-04-03 MED ORDER — ACETAMINOPHEN 650 MG RE SUPP
650.0000 mg | Freq: Four times a day (QID) | RECTAL | Status: DC | PRN
  Administered 2018-04-03: 650 mg via RECTAL
  Filled 2018-04-03: qty 1

## 2018-04-03 MED ORDER — LACTATED RINGERS IV SOLN
INTRAVENOUS | Status: AC
  Administered 2018-04-03: 19:00:00 via INTRAVENOUS

## 2018-04-03 MED ORDER — INSULIN REGULAR(HUMAN) IN NACL 100-0.9 UT/100ML-% IV SOLN
INTRAVENOUS | Status: DC
  Administered 2018-04-03: 4.3 [IU]/h via INTRAVENOUS
  Filled 2018-04-03: qty 100

## 2018-04-03 MED ORDER — SODIUM CHLORIDE 0.9 % IV SOLN
2.0000 g | Freq: Once | INTRAVENOUS | Status: AC
  Administered 2018-04-03: 2 g via INTRAVENOUS
  Filled 2018-04-03: qty 2

## 2018-04-03 NOTE — Progress Notes (Signed)
Pharmacy Antibiotic Note  [name] [name] is a 42 y.o. female admitted on 04/03/2018 with sepsis.  Pharmacy has been consulted for cefepime dosing.  Also given clindamycin 900mg  in ED.  SCr 1.61, CrCl~ 73 mL/min.    Plan: Cefepime 2g IV q8h Monitor renal function, Cx and LOT  Height: 5\' 10"  (177.8 cm) Weight: (!) 328 lb (148.8 kg) IBW/kg (Calculated) : 68.5  Temp (24hrs), Avg:102.8 F (39.3 C), Min:102.8 F (39.3 C), Max:102.8 F (39.3 C)  Recent Labs  Lab 04/03/18 0350 04/03/18 0400 04/03/18 0655  WBC 6.2  --   --   CREATININE 1.61*  --   --   LATICACIDVEN  --  1.61 1.13    Estimated Creatinine Clearance: 73 mL/min (A) (by C-G formula based on SCr of 1.61 mg/dL (H)).    Allergies  Allergen Reactions  . Vancomycin Other (See Comments)    Acute renal failure suspected secondary to vanco    Antimicrobials this admission: Clinda x 1 11/12 Cefepime 11/12>>   Microbiology results: 11/12 BCx: sent  Bertis Ruddy, PharmD Clinical Pharmacist Please check AMION for all [facility] numbers 04/03/2018 12:15 PM

## 2018-04-03 NOTE — ED Triage Notes (Signed)
Pt BIB [facility] for a possible infection of her left stump and a fever. EMS reports the pt is also hypertensive and hyperglycemic as noted. EMS reports 12 lead unremarkable however tachy. EMS started IV as noted with 655ml of NaCL administered via documented IV. EMS reports low grade temp of 100.2 F.   Pt reports her stump has been leaking more and feels more swollen then usual. Pt reports this all started on Saturday and today it just got unbearable.

## 2018-04-03 NOTE — ED Notes (Signed)
Family at bedside. 

## 2018-04-03 NOTE — ED Notes (Signed)
Waiting on SQ insulin from pharmacy according to admitting MD may stop insulin drip 1 hour after administration.

## 2018-04-03 NOTE — Progress Notes (Signed)
Inpatient Diabetes Program Recommendations  AACE/ADA: New Consensus Statement on Inpatient Glycemic Control (2015)  Target Ranges:  Prepandial:   less than 140 mg/dL      Peak postprandial:   less than 180 mg/dL (1-2 hours)      Critically ill patients:  140 - 180 mg/dL   Results for ARILYNN, BLAKENEY (MRN [id number]) as of 04/03/2018 09:46  Ref. Range 04/03/2018 03:26 04/03/2018 05:17 04/03/2018 07:24 04/03/2018 08:47  Glucose-Capillary Latest Ref Range: 70 - 99 mg/dL 487 (H) 493 (H) 443 (H) 323 (H)   Review of Glycemic Control  Diabetes history: DM 2 Outpatient Diabetes medications: Lantus 40 units BID, Novolog 15 units tid meal coverage, Victoza 1.8 mg Daily Current orders for Inpatient glycemic control: IV insulin gtt  Last A1c 11.4% on 11/2017  Inpatient Diabetes Program Recommendations:    Consider repeating A1c level.  Patient on IV insulin with glucose on presentation of 535. Patient requiring insulin gtt rate currently is 5 units/hour, glucose currently 323 mg/dl.  Will follow glucose trends and insulin gtt rate. Based on patient's weight and gtt rate may need close to home dose of insulin when its time to transition off IV insulin.  Thanks,  Tama Headings RN, MSN, BC-ADM Inpatient Diabetes Coordinator Team Pager [phone] (8a-5p)

## 2018-04-03 NOTE — ED Provider Notes (Addendum)
[facility] EMERGENCY DEPARTMENT Provider Note   CSN: [id number] Arrival date & time: 04/03/18  0309     History   Chief Complaint Chief Complaint  Patient presents with  . Fever  . Code Sepsis    HPI [name] [name] is a 42 y.o. female.  Patient presents to the emergency department from home with concerns over possible infection of her stump.  Patient reports that she has had chronic problems with her stump from a previous left below the knee amputation.  She has not been able to put her prosthesis on because of swelling.  She has noticed increased swelling as well as drainage of clear fluid from the stump.  She has been feeling weak and has had a cough.  She reports that she has noticed chills but has not taken her temperature, was unaware that she has a fever.     Past Medical History:  Diagnosis Date  . Abdominal muscle pain 09/08/2016  . Abnormal Pap smear of cervix 2009  . Abscess    history of multiple abscesses  . Acute bilateral low back pain 02/13/2017  . Acute blood loss anemia   . Anemia of chronic disease 2002  . Anxiety    Panic attacks  . Bilateral lower extremity edema 05/13/2016  . Bipolar disorder ([place])   . Cellulitis 05/21/2014   right eye  . Chronic bronchitis ([place])    "get it q yr" (05/13/2013)  . Chronic pain   . Depression   . Edema of lower extremity   . Endocarditis 2002   subacute bacterial endocarditis.   . Family history of anesthesia complication    "my mom has a hard time coming out from under"  . Fibromyalgia   . GERD (gastroesophageal reflux disease)    occ  . Heart murmur   . History of blood transfusion    "just low blood count" (05/13/2013)  . Hyperlipidemia   . Hypertension   . Hypothyroidism   . Hypothyroidism, adult 03/21/2014  . Leukocytosis   . Necrosis ([place])    and ulceration  . Obesity   . OSA on CPAP    does not wear CPAP  . Peripheral neuropathy   . Type II diabetes mellitus ([place])    Type  II    Patient Active Problem List   Diagnosis Date Noted  . Vaginal candidiasis 03/12/2018  . Herpes genitalis in women 01/31/2018  . Herpes simplex type 1 infection 01/16/2018  . Status post below knee amputation, left ([place]) 04/11/2017  . Bipolar affective disorder ([place])   . Adjustment disorder with depressed mood 03/26/2017  . Abnormal ECG 03/24/2017  . Heart failure with preserved ejection fraction ([place]) 02/09/2017  . Routine adult health maintenance 12/08/2016  . Chronic pain of right knee 09/08/2016  . CKD (chronic kidney disease) stage 2, GFR 60-89 ml/min 07/15/2016  . OSA (obstructive sleep apnea) 05/13/2016  . Morbid obesity due to excess calories ([place]) 11/27/2015  . History of Low serum cortisol level ([place]) 01/07/2015  . Hypothyroidism 04/25/2013  . Financial difficulty 04/25/2013  . Uncontrolled type 2 diabetes mellitus ([place]) 07/12/2012  . Necrotizing fasciitis s/p OR debridements 07/06/2012  . Carpal tunnel syndrome, bilateral 01/25/2012  . Diabetic peripheral neuropathy ([place]) 07/04/2011  . Anxiety and depression 06/02/2011  . History of abnormal cervical Pap smear 12/13/2007  . GERD 12/06/2007  . Hyperlipidemia 08/29/2007  . Hypertension associated with diabetes ([place]) 08/29/2007    Past Surgical History:  Procedure Laterality Date  .  AMPUTATION Left 04/07/2017   Procedure: LEFT BELOW KNEE AMPUTATION;  Surgeon: Newt Minion, MD;  Location: [facility];  Service: Orthopedics;  Laterality: Left;  . EYE SURGERY     l[place]  . INCISION AND DRAINAGE ABSCESS     multiple I&Ds  . INCISION AND DRAINAGE ABSCESS Left 07/09/2012   Procedure: DRESSING CHANGE, THIGH WOUND;  Surgeon: Harl Bowie, MD;  Location: [facility];  Service: General;  Laterality: Left;  . INCISION AND DRAINAGE OF WOUND Left 07/07/2012   Procedure: IRRIGATION AND DEBRIDEMENT WOUND;  Surgeon: Harl Bowie, MD;  Location: [facility];  Service: General;  Laterality: Left;  . INCISION AND DRAINAGE  PERIRECTAL ABSCESS Left 07/14/2012   Procedure: DEBRIDEMENT OF SKIN & SOFT TISSUE; DRESSING CHANGE UNDER ANESTHESIA;  Surgeon: Gayland Curry, MD,FACS;  Location: [facility];  Service: General;  Laterality: Left;  . INCISION AND DRAINAGE PERIRECTAL ABSCESS Left 07/16/2012   Procedure: I&D Left Thigh;  Surgeon: Gwenyth Ober, MD;  Location: [facility];  Service: General;  Laterality: Left;  . INCISION AND DRAINAGE PERIRECTAL ABSCESS N/A 01/05/2015   Procedure: IRRIGATION AND DEBRIDEMENT PERIRECTAL ABSCESS;  Surgeon: Donnie Mesa, MD;  Location: [facility];  Service: General;  Laterality: N/A;  . IRRIGATION AND DEBRIDEMENT ABSCESS Left 07/06/2012   Procedure: IRRIGATION AND DEBRIDEMENT ABSCESS BUTTOCKS AND THIGH;  Surgeon: Shann Medal, MD;  Location: [facility];  Service: General;  Laterality: Left;  . IRRIGATION AND DEBRIDEMENT ABSCESS Left 08/10/2012   Procedure: IRRIGATION AND DEBRIDEMENT ABSCESS;  Surgeon: Madilyn Hook, DO;  Location: [facility];  Service: General;  Laterality: Left;  . STUMP REVISION Left 06/09/2017   Procedure: REVISION LEFT BELOW KNEE AMPUTATION;  Surgeon: Newt Minion, MD;  Location: [facility];  Service: Orthopedics;  Laterality: Left;  . TONSILLECTOMY  1994     OB History   None      Home Medications    Prior to Admission medications   Medication Sig Start Date End Date Taking? Authorizing Provider  amitriptyline (ELAVIL) 50 MG tablet TAKE 1 TABLET BY MOUTH AT BEDTIME 10/31/17   Oda Kilts, MD  Ascorbic Acid (VITAMIN C) 1000 MG tablet Take 1,000 mg by mouth daily.    [provider]  atorvastatin (LIPITOR) 20 MG tablet TAKE 1 TABLET BY MOUTH EVERY DAY 03/01/18   Helberg, Larkin Ina, MD  bumetanide (BUMEX) 1 MG tablet Take 1 tablet (1 mg total) by mouth 2 (two) times daily. 08/28/17   Oval Linsey, MD  cyclobenzaprine (FLEXERIL) 10 MG tablet TAKE 1 TABLET BY MOUTH EVERY TWELVE HOURS AS NEEDED for MUSCLE SPASMS 03/07/18   Ina Homes, MD  diclofenac sodium (VOLTAREN) 1 % GEL  Apply 4 grams to affected area 4 times daily as needed for pain 01/30/18   Ina Homes, MD  docusate sodium (COLACE) 100 MG capsule Take 1 capsule (100 mg total) by mouth 2 (two) times daily. Patient taking differently: Take 100 mg by mouth 2 (two) times daily as needed for moderate constipation.  04/25/17   Angiulli, Lavon Paganini, PA-C  fluconazole (DIFLUCAN) 150 MG tablet Please take one tablet every three days (72 hours apart) until completed 03/12/18   Kathi Ludwig, MD  FLUoxetine (PROZAC) 20 MG/5ML solution Take 15 mLs (60 mg total) by mouth daily. 01/03/18   Molt, Bethany, DO  ibuprofen (ADVIL) 200 MG tablet Take 3 tablets (600 mg total) by mouth every 8 (eight) hours as needed for moderate pain (Take with food and water). 01/29/18 01/29/19  Masoudi, Elhamalsadat, MD  insulin aspart (NOVOLOG FLEXPEN) 100 UNIT/ML FlexPen Inject 13 Units into the skin 3 (three) times daily with meals. 09/07/17   Colbert Ewing, MD  insulin glargine (LANTUS) 100 unit/mL SOPN Inject 0.5 mLs (50 Units total) into the skin 2 (two) times daily. 09/07/17   Colbert Ewing, MD  LYRICA 100 MG capsule TAKE 1 CAPSULE BY MOUTH 3 TIMES DAILY 01/02/18   Ina Homes, MD  Multiple Vitamins-Calcium (ONE-A-DAY WOMENS PO) Take 1 tablet by mouth daily.    [provider]  mupirocin ointment (BACTROBAN) 2 % Apply 1 application topically 2 (two) times daily. 06/28/17   Suzan Slick, NP  nystatin (MYCOSTATIN/NYSTOP) powder Apply topically 4 (four) times daily. After cleaning and drying the affected area for two weeks 03/12/18   Kathi Ludwig, MD  oxyCODONE-acetaminophen (PERCOCET/ROXICET) 5-325 MG tablet Take 1 tablet by mouth every 6 (six) hours as needed for severe pain. 03/28/18   Suzan Slick, NP  polyethylene glycol (MIRALAX / GLYCOLAX) packet Take 17 g by mouth daily. Patient taking differently: Take 17 g by mouth daily as needed for moderate constipation.  04/25/17   Angiulli, Lavon Paganini, PA-C  ULTICARE INSULIN  SYRINGE 31G X 1/4" 0.5 ML MISC Use to inject insulin as directed 03/06/18   Helberg, Larkin Ina, MD  VICTOZA 18 MG/3ML SOPN Inject 0.3 mLs (1.8 mg total) into the skin daily. 01/30/18   Ina Homes, MD    Family History Family History  Problem Relation Age of Onset  . Heart failure Mother   . Diabetes Mother   . Kidney disease Mother   . Kidney disease Father   . Diabetes Father   . Diabetes Paternal Grandmother   . Heart failure Paternal Grandmother   . Other Other     Social History Social History   Tobacco Use  . Smoking status: Current Every Day Smoker    Packs/day: 0.40    Years: 15.00    Pack years: 6.00    Types: Cigarettes  . Smokeless tobacco: Never Used  . Tobacco comment: 4 per day   Substance Use Topics  . Alcohol use: Yes    Alcohol/week: 0.0 standard drinks    Comment: Socially-" monthy  maybe"  . Drug use: No     Allergies   Vancomycin   Review of Systems Review of Systems  Constitutional: Positive for chills.  Cardiovascular: Positive for leg swelling.  Skin: Positive for wound.  All other systems reviewed and are negative.    Physical Exam Updated Vital Signs BP 135/87 (BP Location: Right Arm)   Pulse (!) 132   Temp (!) 102.8 F (39.3 C) (Oral)   Resp (!) 30   Ht 5\' 10"  (1.778 m)   Wt (!) 148.8 kg   LMP 02/19/2018 Comment: pt said she has irregular periods but she is NOT pregnant  SpO2 98%   BMI 47.06 kg/m   Physical Exam  Constitutional: She is oriented to person, place, and time. She appears well-developed and well-nourished. No distress.  HENT:  Head: Normocephalic and atraumatic.  Right Ear: Hearing normal.  Left Ear: Hearing normal.  Nose: Nose normal.  Mouth/Throat: Oropharynx is clear and moist and mucous membranes are normal.  Eyes: Pupils are equal, round, and reactive to light. Conjunctivae and EOM are normal.  Neck: Normal range of motion. Neck supple.  Cardiovascular: Regular rhythm, S1 normal and S2 normal. Exam  reveals no gallop and no friction rub.  No murmur heard. Pulmonary/Chest: Effort normal and  breath sounds normal. No respiratory distress. She exhibits no tenderness.  Abdominal: Soft. Normal appearance and bowel sounds are normal. There is no hepatosplenomegaly. There is no tenderness. There is no rebound, no guarding, no tenderness at McBurney's point and negative Murphy's sign. No hernia.  Musculoskeletal: Normal range of motion.  Left BKA  Neurological: She is alert and oriented to person, place, and time. She has normal strength. No cranial nerve deficit or sensory deficit. Coordination normal. GCS eye subscore is 4. GCS verbal subscore is 5. GCS motor subscore is 6.  Skin: Skin is warm, dry and intact. No rash noted. No cyanosis.  Some edema and chronic thickening/callus formation of the skin of the left BKA but no erythema, no warmth, no induration or fluctuance  Psychiatric: She has a normal mood and affect. Her speech is normal and behavior is normal. Thought content normal.  Nursing note and vitals reviewed.    ED Treatments / Results  Labs (all labs ordered are listed, but only abnormal results are displayed) Labs Reviewed  COMPREHENSIVE METABOLIC PANEL - Abnormal; Notable for the following components:      Result Value   Sodium 125 (*)    Chloride 95 (*)    CO2 16 (*)    Glucose, Bld 535 (*)    Creatinine, Ser 1.61 (*)    Calcium 8.0 (*)    Total Protein 6.3 (*)    Albumin 1.6 (*)    AST 13 (*)    Total Bilirubin 1.3 (*)    GFR calc non Af Amer 39 (*)    GFR calc Af Amer 45 (*)    All other components within normal limits  CBC WITH DIFFERENTIAL/PLATELET - Abnormal; Notable for the following components:   Hemoglobin 10.0 (*)    HCT 32.5 (*)    MCH 24.7 (*)    Lymphs Abs 0.4 (*)    All other components within normal limits  CBG MONITORING, ED - Abnormal; Notable for the following components:   Glucose-Capillary 487 (*)    All other components within normal limits    CULTURE, BLOOD (ROUTINE X 2)  CULTURE, BLOOD (ROUTINE X 2)  URINALYSIS, ROUTINE W REFLEX MICROSCOPIC  INFLUENZA PANEL BY PCR (TYPE A & B)  I-STAT CG4 LACTIC ACID, ED  I-STAT CG4 LACTIC ACID, ED    EKG EKG Interpretation  Date/Time:  Tuesday April 03 2018 03:28:21 EST Ventricular Rate:  133 PR Interval:    QRS Duration: 88 QT Interval:  299 QTC Calculation: 445 R Axis:   60 Text Interpretation:  Sinus tachycardia Low voltage, precordial leads Confirmed by Orpah Greek ([id number]) on 04/03/2018 3:35:17 AM   Radiology Dg Chest Port 1 View  Result Date: 04/03/2018 CLINICAL DATA:  Acute onset of shortness of breath. Sepsis. EXAM: PORTABLE CHEST 1 VIEW COMPARISON:  Chest radiograph performed 04/17/2017 FINDINGS: The lungs are hypoexpanded. Vascular congestion is noted. Increased interstitial markings raise concern for mild pulmonary edema. There is no evidence of pleural effusion or pneumothorax. The cardiomediastinal silhouette is within normal limits. No acute osseous abnormalities are seen. IMPRESSION: Lungs hypoexpanded. Vascular congestion noted. Increased interstitial markings raise concern for mild pulmonary edema. Electronically Signed   By: Garald Balding M.D.   On: 04/03/2018 04:06    Procedures Procedures (including critical care time)  Medications Ordered in ED Medications  ceFEPIme (MAXIPIME) 2 g in sodium chloride 0.9 % 100 mL IVPB (2 g Intravenous New Bag/Given 04/03/18 0447)  acetaminophen (TYLENOL) tablet 1,000 mg (has no  administration in time range)  dextrose 5 %-0.45 % sodium chloride infusion (has no administration in time range)  insulin regular, human (MYXREDLIN) 100 units/ 100 mL infusion (has no administration in time range)  0.9 %  sodium chloride infusion (has no administration in time range)  clindamycin (CLEOCIN) IVPB 900 mg (0 mg Intravenous Stopped 04/03/18 0431)     Initial Impression / Assessment and Plan / ED Course  I have reviewed  the triage vital signs and the nursing notes.  Pertinent labs & imaging results that were available during my care of the patient were reviewed by me and considered in my medical decision making (see chart for details).     Patient presents to the emergency department with fever of 102.8.  Patient is hyperglycemic, bicarb is low at 16 but does not have a significant anion gap.  Likely has some element of DKA secondary to uncontrolled diabetes.  Patient initiated on glucostabilizer..  She also is very hypertensive.  Patient was tachycardic, tachypnea, sepsis suspected, initiated on treatment.  Patient appears volume overloaded, not administered IV fluids.  She does not have an elevated lactic that would require aggressive resuscitation.  Patient was concerned that her stump is the source of the fever, but it is not clearly infected at this time.  She apparently has had problems with infections in the past, but there is nothing to suggest infection currently.  She does have a cough but chest x-ray does not show obvious pneumonia.  Patient started on broad-spectrum antibiotics.  This was chosen in conjunction with pharmacy, as patient has a history of vancomycin intolerance secondary to renal failure.  Pharmacy recommended cefepime and clindamycin for broad-spectrum coverage including skin.  CRITICAL CARE Performed by: Orpah Greek   Total critical care time: 35 minutes  Critical care time was exclusive of separately billable procedures and treating other patients.  Critical care was necessary to treat or prevent imminent or life-threatening deterioration.  Critical care was time spent personally by me on the following activities: development of treatment plan with patient and/or surrogate as well as nursing, discussions with consultants, evaluation of patient's response to treatment, examination of patient, obtaining history from patient or surrogate, ordering and performing treatments and  interventions, ordering and review of laboratory studies, ordering and review of radiographic studies, pulse oximetry and re-evaluation of patient's condition.   Final Clinical Impressions(s) / ED Diagnoses   Final diagnoses:  Sepsis without acute organ dysfunction, due to unspecified organism [facility])  Hyperglycemia    ED Discharge Orders    None       Orpah Greek, MD 04/03/18 3244    Orpah Greek, MD 04/03/18 [id number]

## 2018-04-03 NOTE — Progress Notes (Addendum)
Pharmacy Antibiotic Note  [name] [name] is a 42 y.o. female admitted on 04/03/2018 with sepsis secondary to cellulitis of left BKA.  Pharmacy has been consulted for daptomycin dosing. Teaching service received approval from ID to start due to history of AKI with vancomycin.  Currently, febrile (Tmax 102.8) and WBC wnl. Scr 1.67 mg/dL and estimated CrCl 70.4 mL/min. Of note, patient has PMH CKD II.   Plan: Daptomycin 900 mg IV q24h (~6mg /kg) Cefepime 2 gm IV q8h Monitor renal function, Cx, LOT Monitor CK now and then weekly   Height: 5\' 10"  (177.8 cm) Weight: (!) 328 lb (148.8 kg) IBW/kg (Calculated) : 68.5  Temp (24hrs), Avg:102.8 F (39.3 C), Min:102.8 F (39.3 C), Max:102.8 F (39.3 C)  Recent Labs  Lab 04/03/18 0350 04/03/18 0400 04/03/18 0655  WBC 6.2  --   --   CREATININE 1.61*  --   --   LATICACIDVEN  --  1.61 1.13    Estimated Creatinine Clearance: 73 mL/min (A) (by C-G formula based on SCr of 1.61 mg/dL (H)).    Allergies  Allergen Reactions  . Vancomycin Other (See Comments)    Acute renal failure suspected secondary to vanco    Antimicrobials this admission: Clindamycin 11/12 Cefepime 11/12>> Daptomycin 11/12>>   Microbiology results: 11/12 BCx:  Thank you for allowing pharmacy to be a part of this patient's care.  Willia Craze, Pharmacy Student

## 2018-04-03 NOTE — Progress Notes (Signed)
PHARMACY - PHYSICIAN COMMUNICATION CRITICAL VALUE ALERT - BLOOD CULTURE IDENTIFICATION (BCID)  [name] [name] is an 42 y.o. female who presented to [facility] on 04/03/2018 with a chief complaint of infected stump  1/2 blood cx group b strep  Name of physician (or Provider) Contacted: Dr Eileen Stanford  Current antibiotics: Cefepime, Dapto  Changes to prescribed antibiotics recommended:  Recommended to consider de-escalation to PCN No changes made  Results for orders placed or performed during the hospital encounter of 04/03/18  Blood Culture ID Panel (Reflexed) (Collected: 04/03/2018  3:54 AM)  Result Value Ref Range   Enterococcus species NOT DETECTED NOT DETECTED   Listeria monocytogenes NOT DETECTED NOT DETECTED   Staphylococcus species NOT DETECTED NOT DETECTED   Staphylococcus aureus (BCID) NOT DETECTED NOT DETECTED   Streptococcus species DETECTED (A) NOT DETECTED   Streptococcus agalactiae DETECTED (A) NOT DETECTED   Streptococcus pneumoniae NOT DETECTED NOT DETECTED   Streptococcus pyogenes NOT DETECTED NOT DETECTED   Acinetobacter baumannii NOT DETECTED NOT DETECTED   Enterobacteriaceae species NOT DETECTED NOT DETECTED   Enterobacter cloacae complex NOT DETECTED NOT DETECTED   Escherichia coli NOT DETECTED NOT DETECTED   Klebsiella oxytoca NOT DETECTED NOT DETECTED   Klebsiella pneumoniae NOT DETECTED NOT DETECTED   Proteus species NOT DETECTED NOT DETECTED   Serratia marcescens NOT DETECTED NOT DETECTED   Haemophilus influenzae NOT DETECTED NOT DETECTED   Neisseria meningitidis NOT DETECTED NOT DETECTED   Pseudomonas aeruginosa NOT DETECTED NOT DETECTED   Candida albicans NOT DETECTED NOT DETECTED   Candida glabrata NOT DETECTED NOT DETECTED   Candida krusei NOT DETECTED NOT DETECTED   Candida parapsilosis NOT DETECTED NOT DETECTED   Candida tropicalis NOT DETECTED NOT DETECTED   Levester Fresh, PharmD, BCPS, BCCCP Clinical Pharmacist [phone]  Please check  AMION for all [facility] numbers  04/03/2018 9:09 PM

## 2018-04-03 NOTE — ED Notes (Signed)
US being done at bedside

## 2018-04-03 NOTE — ED Notes (Signed)
Pt is currently receiving 2nd Liter of LR- paged admitting MD to make aware every hour another Liter on LR will populate, MD notes states to infuse 2 L LR. MD states will look into this and cancel the repeating orders.

## 2018-04-03 NOTE — Progress Notes (Signed)
Patient arrived the unit from ER, vital sign obtained see flowsheet, patient oriented to room and staff, bed in lowest position, call bell within reach will monitor.

## 2018-04-03 NOTE — H&P (Signed)
Date: 04/03/2018               Patient Name:  [name] [name] MRN: [id number]  DOB: December 20, 1975 Age / Sex: 42 y.o., female   PCP: Ina Homes, MD         Medical Service: Internal Medicine Teaching Service         Attending Physician: Dr. Evette Doffing, Mallie Mussel, *    First Contact: Dr. Sharon Seller Pager: [phone]  Second Contact: Dr. Frederico Hamman Pager: [phone]       After Hours (After 5p/  First Contact Pager: [phone]  weekends / holidays): Second Contact Pager: [phone]   Chief Complaint: Left s/p below knee amputation stump pain  History of Present Illness: [name] is a 42 year old female with past medical history of insulin dependent uncontrolled type II diabetes, depression, diastolic heart failure and OSA that presents to the ED for a two history of left lower stump pain.  Patient states she restarted wearing her left lower extremity prosthetic recently. She states the prosthetic hasn't been fitting well due to stump size but was cleared by Dr. Sharol Given to restart wearing it. She went to the grocery store on Sunday and after walking around developed pain at the stump site. She states for the past 2 days the pain has progressively gotten worse and now she cannot use the prosthetic.  She states the pain is worse with movement and starts at the stump and radiates up with leg.  She has associated symptoms of subjective fever/chills.  She denies nausea/vomiting, chest pains, shortness of breath, or abdominal pain.  Meds:  Current Meds  Medication Sig  . amitriptyline (ELAVIL) 50 MG tablet TAKE 1 TABLET BY MOUTH AT BEDTIME (Patient taking differently: Take 50 mg by mouth at bedtime. )  . atorvastatin (LIPITOR) 20 MG tablet TAKE 1 TABLET BY MOUTH EVERY DAY (Patient taking differently: Take 20 mg by mouth daily. )  . bumetanide (BUMEX) 1 MG tablet Take 1 tablet (1 mg total) by mouth 2 (two) times daily.  . cyclobenzaprine (FLEXERIL) 10 MG tablet TAKE 1 TABLET BY MOUTH EVERY  TWELVE HOURS AS NEEDED for MUSCLE SPASMS (Patient taking differently: Take 10 mg by mouth 3 (three) times daily as needed for muscle spasms. )  . diclofenac sodium (VOLTAREN) 1 % GEL Apply 4 grams to affected area 4 times daily as needed for pain (Patient taking differently: Apply 4 g topically 4 (four) times daily as needed (pain). )  . docusate sodium (COLACE) 100 MG capsule Take 1 capsule (100 mg total) by mouth 2 (two) times daily. (Patient taking differently: Take 100 mg by mouth 2 (two) times daily as needed for moderate constipation. )  . FLUoxetine (PROZAC) 20 MG/5ML solution Take 15 mLs (60 mg total) by mouth daily.  Marland Kitchen ibuprofen (ADVIL) 200 MG tablet Take 3 tablets (600 mg total) by mouth every 8 (eight) hours as needed for moderate pain (Take with food and water).  . insulin aspart (NOVOLOG FLEXPEN) 100 UNIT/ML FlexPen Inject 13 Units into the skin 3 (three) times daily with meals. (Patient taking differently: Inject 15 Units into the skin 3 (three) times daily with meals. )  . insulin glargine (LANTUS) 100 unit/mL SOPN Inject 0.5 mLs (50 Units total) into the skin 2 (two) times daily. (Patient taking differently: Inject 40 Units into the skin 2 (two) times daily. )  . LYRICA 100 MG capsule TAKE 1 CAPSULE BY MOUTH 3 TIMES DAILY (Patient taking differently:  Take 100 mg by mouth 3 (three) times daily. )  . mupirocin ointment (BACTROBAN) 2 % Apply 1 application topically 2 (two) times daily. (Patient taking differently: Apply 1 application topically 2 (two) times daily as needed (skin care). )  . nystatin (MYCOSTATIN/NYSTOP) powder Apply topically 4 (four) times daily. After cleaning and drying the affected area for two weeks (Patient taking differently: Apply 1 g topically 4 (four) times daily as needed (skin care). After cleaning and drying the affected area for two weeks)  . oxyCODONE-acetaminophen (PERCOCET/ROXICET) 5-325 MG tablet Take 1 tablet by mouth every 6 (six) hours as needed for severe  pain.  . polyethylene glycol (MIRALAX / GLYCOLAX) packet Take 17 g by mouth daily. (Patient taking differently: Take 17 g by mouth daily as needed for moderate constipation. )  . ULTICARE INSULIN SYRINGE 31G X 1/4" 0.5 ML MISC Use to inject insulin as directed  . VICTOZA 18 MG/3ML SOPN Inject 0.3 mLs (1.8 mg total) into the skin daily.     Allergies: Allergies as of 04/03/2018 - Review Complete 04/03/2018  Allergen Reaction Noted  . Vancomycin Other (See Comments) 08/24/2012   Past Medical History:  Diagnosis Date  . Abdominal muscle pain 09/08/2016  . Abnormal Pap smear of cervix 2009  . Abscess    history of multiple abscesses  . Acute bilateral low back pain 02/13/2017  . Acute blood loss anemia   . Anemia of chronic disease 2002  . Anxiety    Panic attacks  . Bilateral lower extremity edema 05/13/2016  . Bipolar disorder ([place])   . Cellulitis 05/21/2014   right eye  . Chronic bronchitis ([place])    "get it q yr" (05/13/2013)  . Chronic pain   . Depression   . Edema of lower extremity   . Endocarditis 2002   subacute bacterial endocarditis.   . Family history of anesthesia complication    "my mom has a hard time coming out from under"  . Fibromyalgia   . GERD (gastroesophageal reflux disease)    occ  . Heart murmur   . History of blood transfusion    "just low blood count" (05/13/2013)  . Hyperlipidemia   . Hypertension   . Hypothyroidism   . Hypothyroidism, adult 03/21/2014  . Leukocytosis   . Necrosis ([place])    and ulceration  . Obesity   . OSA on CPAP    does not wear CPAP  . Peripheral neuropathy   . Type II diabetes mellitus (HCC)    Type  II    Family History:  Family History  Problem Relation Age of Onset  . Heart failure Mother   . Diabetes Mother   . Kidney disease Mother   . Kidney disease Father   . Diabetes Father   . Diabetes Paternal Grandmother   . Heart failure Paternal Grandmother   . Other Other      Social History: Tobacco use:   Current smoker Alcohol use: denies  Review of Systems: A complete ROS was negative except as per HPI.   Physical Exam: Blood pressure 90/60, pulse (!) 111, temperature (!) 102.8 F (39.3 C), temperature source Oral, resp. rate (!) 23, height 5\' 10"  (1.778 m), weight (!) 148.8 kg, last menstrual period 02/19/2018, SpO2 98 %. Physical Exam  Constitutional: She is oriented to person, place, and time.  Laying on right side in minimal distress  HENT:  Head: Normocephalic and atraumatic.  Eyes: Pupils are equal, round, and reactive to light. Conjunctivae and  EOM are normal.  Neck: Normal range of motion. Neck supple.  Cardiovascular: Regular rhythm and normal heart sounds. Exam reveals no gallop and no friction rub.  No murmur heard. Tachycardic  Pulmonary/Chest: Effort normal and breath sounds normal. No respiratory distress. She has no wheezes. She has no rales.  Abdominal: Soft. She exhibits no distension and no mass. There is no tenderness. There is no rebound and no guarding.  Neurological: She is alert and oriented to person, place, and time.  Skin: Skin is warm. No erythema.  Left BKA with swelling.  On the medial aspect there is a small, approximately 1 inch open wound with dried purulent drainage.       EKG: personally reviewed my interpretation is, sinus tachycardia without ischemic changes  CXR: personally reviewed my interpretation is minimal vascular congestion without pleural effusions  Assessment & Plan by Problem: Principal Problem:   Sepsis ([place]) Active Problems:   Anxiety and depression   Uncontrolled type 2 diabetes mellitus (HCC)   OSA (obstructive sleep apnea)   Status post below knee amputation, left (HCC)   BKA stump complication (HCC)  [name] [name] is a 42 year old female with uncontrolled type 2 diabetes, S/P left BKA, OSA and depression that presented to the emergency department for 2 day history of progressively worsening left stump  pain.  Sepsis On admission patient was febrile at 102 and tachycardic at 132.  No leukocytosis was noted and lactic acid was normal. Will obtain pro-calcitonin and CRP. Blood pressures fluctuated while in the ED going as low as 80 systolic. Will initiate fluid resuscitation. Chest x-ray and UA without signs of infection.  Left BKA without with a small an open wound with dried purulent drainage. This perhaps may be the source of her fever. Will obtain an ultrasound to evaluate for abscess.  Blood cultures were obtained in the ED and patient was started on broad-spectrum antibiotics including cefepime and clindamycin due to a vancomycin allergy. - 2L bolus LR - maintenance fluids LR @100  cc/hr -cefepime and clindamycin - follow up Blood cultures -ultrasound of left lower extremity to assess for abscess  DKA Patient presented with a serum glucose of 535, bicarb of 16, ketones in urine and anion gap of 14. Albumin is 1.6.  Patient states she has not taken her insulin for the past 2 weeks. The suspected infection and non-adherence to insulin is contributing to her presentation of DKA.    She was started on an insulin drip. CBGs are trending down and currently 300s. Will continue on the insulin drip and transition to Lantus once cbgs under better control. -insulin drip -fluids -lantus   Diastolic heart failure Vascular congestion on chest x-ray was noted. Patient denies shortness of breath and is currently laying flat without respiratory issues. Giving fluids due to sepsis protocol. Will need to monitor and give Lasix if needed. -Patient getting fluids.  Will need to monitor respiratory status and give Lasix if needed.   History of depression -Continue home Prozac  History of OSA on CPAP -Continue CPAP daily at bedtime   Dispo: Admit patient to Inpatient with expected length of stay greater than 2 midnights.  Signed: Valinda Party, DO 04/03/2018, 9:58 AM  Pager: [phone]

## 2018-04-04 ENCOUNTER — Inpatient Hospital Stay (HOSPITAL_COMMUNITY): Payer: Medicare Other

## 2018-04-04 ENCOUNTER — Encounter (HOSPITAL_COMMUNITY): Payer: Self-pay | Admitting: Anesthesiology

## 2018-04-04 DIAGNOSIS — E1122 Type 2 diabetes mellitus with diabetic chronic kidney disease: Secondary | ICD-10-CM

## 2018-04-04 DIAGNOSIS — A419 Sepsis, unspecified organism: Secondary | ICD-10-CM

## 2018-04-04 DIAGNOSIS — T879 Unspecified complications of amputation stump: Secondary | ICD-10-CM

## 2018-04-04 DIAGNOSIS — E1165 Type 2 diabetes mellitus with hyperglycemia: Secondary | ICD-10-CM

## 2018-04-04 DIAGNOSIS — E43 Unspecified severe protein-calorie malnutrition: Secondary | ICD-10-CM | POA: Diagnosis present

## 2018-04-04 DIAGNOSIS — N179 Acute kidney failure, unspecified: Secondary | ICD-10-CM

## 2018-04-04 DIAGNOSIS — I5032 Chronic diastolic (congestive) heart failure: Secondary | ICD-10-CM

## 2018-04-04 DIAGNOSIS — D631 Anemia in chronic kidney disease: Secondary | ICD-10-CM

## 2018-04-04 DIAGNOSIS — Z9114 Patient's other noncompliance with medication regimen: Secondary | ICD-10-CM

## 2018-04-04 DIAGNOSIS — R7881 Bacteremia: Secondary | ICD-10-CM | POA: Diagnosis present

## 2018-04-04 DIAGNOSIS — N183 Chronic kidney disease, stage 3 (moderate): Secondary | ICD-10-CM

## 2018-04-04 DIAGNOSIS — Z89512 Acquired absence of left leg below knee: Secondary | ICD-10-CM

## 2018-04-04 DIAGNOSIS — F339 Major depressive disorder, recurrent, unspecified: Secondary | ICD-10-CM

## 2018-04-04 DIAGNOSIS — A401 Sepsis due to streptococcus, group B: Secondary | ICD-10-CM

## 2018-04-04 LAB — CBC
HCT: 24.4 % — ABNORMAL LOW (ref 36.0–46.0)
HCT: 25.4 % — ABNORMAL LOW (ref 36.0–46.0)
Hemoglobin: 7.7 g/dL — ABNORMAL LOW (ref 12.0–15.0)
Hemoglobin: 7.6 g/dL — ABNORMAL LOW (ref 12.0–15.0)
MCH: 24.9 pg — ABNORMAL LOW (ref 26.0–34.0)
MCH: 24.1 pg — AB (ref 26.0–34.0)
MCHC: 31.6 g/dL (ref 30.0–36.0)
MCHC: 29.9 g/dL — ABNORMAL LOW (ref 30.0–36.0)
MCV: 79 fL — ABNORMAL LOW (ref 80.0–100.0)
MCV: 80.4 fL (ref 80.0–100.0)
NRBC: 0.7 % — AB (ref 0.0–0.2)
PLATELETS: 218 10*3/uL (ref 150–400)
PLATELETS: 197 10*3/uL (ref 150–400)
RBC: 3.09 MIL/uL — ABNORMAL LOW (ref 3.87–5.11)
RBC: 3.16 MIL/uL — ABNORMAL LOW (ref 3.87–5.11)
RDW: 14.6 % (ref 11.5–15.5)
RDW: 14.8 % (ref 11.5–15.5)
WBC: 7.4 10*3/uL (ref 4.0–10.5)
WBC: 8.6 10*3/uL (ref 4.0–10.5)
nRBC: 0.4 % — ABNORMAL HIGH (ref 0.0–0.2)

## 2018-04-04 LAB — BASIC METABOLIC PANEL
Anion gap: 8 (ref 5–15)
Anion gap: 11 (ref 5–15)
BUN: 25 mg/dL — AB (ref 6–20)
BUN: 30 mg/dL — ABNORMAL HIGH (ref 6–20)
CALCIUM: 8 mg/dL — AB (ref 8.9–10.3)
CALCIUM: 7.9 mg/dL — AB (ref 8.9–10.3)
CO2: 18 mmol/L — ABNORMAL LOW (ref 22–32)
CO2: 15 mmol/L — AB (ref 22–32)
CREATININE: 2.17 mg/dL — AB (ref 0.44–1.00)
Chloride: 101 mmol/L (ref 98–111)
Chloride: 99 mmol/L (ref 98–111)
Creatinine, Ser: 2.37 mg/dL — ABNORMAL HIGH (ref 0.44–1.00)
GFR calc Af Amer: 31 mL/min — ABNORMAL LOW (ref >60)
GFR, EST AFRICAN AMERICAN: 28 mL/min — AB (ref >60)
GFR, EST NON AFRICAN AMERICAN: 27 mL/min — AB (ref >60)
GFR, EST NON AFRICAN AMERICAN: 24 mL/min — AB (ref >60)
GLUCOSE: 292 mg/dL — AB (ref 70–99)
Glucose, Bld: 255 mg/dL — ABNORMAL HIGH (ref 70–99)
POTASSIUM: 4.1 mmol/L (ref 3.5–5.1)
Potassium: 4.3 mmol/L (ref 3.5–5.1)
SODIUM: 127 mmol/L — AB (ref 135–145)
Sodium: 125 mmol/L — ABNORMAL LOW (ref 135–145)

## 2018-04-04 LAB — GLUCOSE, CAPILLARY
GLUCOSE-CAPILLARY: 206 mg/dL — AB (ref 70–99)
Glucose-Capillary: 243 mg/dL — ABNORMAL HIGH (ref 70–99)
Glucose-Capillary: 332 mg/dL — ABNORMAL HIGH (ref 70–99)
Glucose-Capillary: 253 mg/dL — ABNORMAL HIGH (ref 70–99)

## 2018-04-04 LAB — CK: CK TOTAL: 441 U/L — AB (ref 38–234)

## 2018-04-04 LAB — MRSA PCR SCREENING: MRSA by PCR: NEGATIVE

## 2018-04-04 MED ORDER — LACTATED RINGERS IV SOLN: INTRAVENOUS | Status: DC

## 2018-04-04 MED ORDER — HYDROMORPHONE HCL 1 MG/ML IJ SOLN
1.0000 mg | INTRAMUSCULAR | Status: DC | PRN
  Administered 2018-04-05 – 2018-04-06 (×5): 1 mg via INTRAVENOUS
  Filled 2018-04-04 (×7): qty 1

## 2018-04-04 MED ORDER — HYDROMORPHONE HCL 1 MG/ML IJ SOLN
2.0000 mg | INTRAMUSCULAR | Status: DC | PRN
  Administered 2018-04-04 (×3): 2 mg via INTRAVENOUS
  Filled 2018-04-04 (×3): qty 2

## 2018-04-04 MED ORDER — LACTATED RINGERS IV SOLN
INTRAVENOUS | Status: DC
  Administered 2018-04-04: 08:00:00 via INTRAVENOUS

## 2018-04-04 MED ORDER — HEPARIN SODIUM (PORCINE) 5000 UNIT/ML IJ SOLN
5000.0000 [IU] | Freq: Three times a day (TID) | INTRAMUSCULAR | Status: DC
  Administered 2018-04-04 – 2018-04-07 (×10): 5000 [IU] via SUBCUTANEOUS
  Filled 2018-04-04 (×10): qty 1

## 2018-04-04 MED ORDER — SODIUM CHLORIDE 0.9 % IV SOLN
2.0000 g | Freq: Two times a day (BID) | INTRAVENOUS | Status: DC
  Administered 2018-04-04 – 2018-04-05 (×2): 2 g via INTRAVENOUS
  Filled 2018-04-04 (×3): qty 2

## 2018-04-04 MED ORDER — FLUOXETINE HCL 20 MG PO CAPS
60.0000 mg | ORAL_CAPSULE | Freq: Every day | ORAL | Status: DC
  Administered 2018-04-04: 60 mg via ORAL
  Filled 2018-04-04: qty 3

## 2018-04-04 MED ORDER — INSULIN ASPART 100 UNIT/ML ~~LOC~~ SOLN
5.0000 [IU] | Freq: Three times a day (TID) | SUBCUTANEOUS | Status: DC
  Administered 2018-04-04 – 2018-04-08 (×10): 5 [IU] via SUBCUTANEOUS

## 2018-04-04 MED ORDER — INSULIN GLARGINE 100 UNIT/ML ~~LOC~~ SOLN
25.0000 [IU] | Freq: Every day | SUBCUTANEOUS | Status: DC
  Administered 2018-04-04: 25 [IU] via SUBCUTANEOUS
  Filled 2018-04-04: qty 0.25

## 2018-04-04 NOTE — Progress Notes (Signed)
RT set up CPAP at patient's bedside. Patient stated she is not sure if she wants to wear it tonight. RT Informed patient to have RT called if she decides to wear CPAP tonight. RT will monitor as needed.

## 2018-04-04 NOTE — Consult Note (Signed)
ORTHOPAEDIC CONSULTATION  REQUESTING PHYSICIAN: Axel Filler, *  Chief Complaint: Increased pain and swelling left below the knee amputation.  HPI: [name] [name] is a 42 y.o. female who presents with increasing pain and swelling in the left below the knee amputation.  Patient states that it has pain to help with all activities of daily living.  Patient has uncontrolled type 2 diabetes with severe protein caloric malnutrition.  Past Medical History:  Diagnosis Date  . Abdominal muscle pain 09/08/2016  . Abnormal Pap smear of cervix 2009  . Abscess    history of multiple abscesses  . Acute bilateral low back pain 02/13/2017  . Acute blood loss anemia   . Anemia of chronic disease 2002  . Anxiety    Panic attacks  . Bilateral lower extremity edema 05/13/2016  . Bipolar disorder ([place])   . Cellulitis 05/21/2014   right eye  . Chronic bronchitis ([place])    "get it q yr" (05/13/2013)  . Chronic pain   . Depression   . Edema of lower extremity   . Endocarditis 2002   subacute bacterial endocarditis.   . Family history of anesthesia complication    "my mom has a hard time coming out from under"  . Fibromyalgia   . GERD (gastroesophageal reflux disease)    occ  . Heart murmur   . History of blood transfusion    "just low blood count" (05/13/2013)  . Hyperlipidemia   . Hypertension   . Hypothyroidism   . Hypothyroidism, adult 03/21/2014  . Leukocytosis   . Necrosis ([place])    and ulceration  . Obesity   . OSA on CPAP    does not wear CPAP  . Peripheral neuropathy   . Type II diabetes mellitus ([place])    Type  II   Past Surgical History:  Procedure Laterality Date  . AMPUTATION Left 04/07/2017   Procedure: LEFT BELOW KNEE AMPUTATION;  Surgeon: Newt Minion, MD;  Location: [facility];  Service: Orthopedics;  Laterality: Left;  . EYE SURGERY     [place]  . INCISION AND DRAINAGE ABSCESS     multiple I&Ds  . INCISION AND DRAINAGE ABSCESS Left 07/09/2012   Procedure: DRESSING CHANGE, THIGH WOUND;  Surgeon: Harl Bowie, MD;  Location: [facility];  Service: General;  Laterality: Left;  . INCISION AND DRAINAGE OF WOUND Left 07/07/2012   Procedure: IRRIGATION AND DEBRIDEMENT WOUND;  Surgeon: Harl Bowie, MD;  Location: [facility];  Service: General;  Laterality: Left;  . INCISION AND DRAINAGE PERIRECTAL ABSCESS Left 07/14/2012   Procedure: DEBRIDEMENT OF SKIN & SOFT TISSUE; DRESSING CHANGE UNDER ANESTHESIA;  Surgeon: Gayland Curry, MD,FACS;  Location: [facility];  Service: General;  Laterality: Left;  . INCISION AND DRAINAGE PERIRECTAL ABSCESS Left 07/16/2012   Procedure: I&D Left Thigh;  Surgeon: Gwenyth Ober, MD;  Location: [facility];  Service: General;  Laterality: Left;  . INCISION AND DRAINAGE PERIRECTAL ABSCESS N/A 01/05/2015   Procedure: IRRIGATION AND DEBRIDEMENT PERIRECTAL ABSCESS;  Surgeon: Donnie Mesa, MD;  Location: [facility];  Service: General;  Laterality: N/A;  . IRRIGATION AND DEBRIDEMENT ABSCESS Left 07/06/2012   Procedure: IRRIGATION AND DEBRIDEMENT ABSCESS BUTTOCKS AND THIGH;  Surgeon: Shann Medal, MD;  Location: [facility];  Service: General;  Laterality: Left;  . IRRIGATION AND DEBRIDEMENT ABSCESS Left 08/10/2012   Procedure: IRRIGATION AND DEBRIDEMENT ABSCESS;  Surgeon: Madilyn Hook, DO;  Location: [facility];  Service: General;  Laterality: Left;  .  STUMP REVISION Left 06/09/2017   Procedure: REVISION LEFT BELOW KNEE AMPUTATION;  Surgeon: Newt Minion, MD;  Location: [facility];  Service: Orthopedics;  Laterality: Left;  . TONSILLECTOMY  1994   Social History   Socioeconomic History  . Marital status: Single    Spouse name: Not on file  . Number of children: 0  . Years of education: 79  . Highest education level: Not on file  Occupational History  . Occupation: disability  Social Needs  . Financial resource strain: Not on file  . Food insecurity:    Worry: Not on file    Inability: Not on file  . Transportation needs:    Medical: Not on  file    Non-medical: Not on file  Tobacco Use  . Smoking status: Current Every Day Smoker    Packs/day: 0.40    Years: 15.00    Pack years: 6.00    Types: Cigarettes  . Smokeless tobacco: Never Used  . Tobacco comment: 4 per day   Substance and Sexual Activity  . Alcohol use: Yes    Alcohol/week: 0.0 standard drinks    Comment: Socially-" monthy  maybe"  . Drug use: No  . Sexual activity: Yes  Lifestyle  . Physical activity:    Days per week: Not on file    Minutes per session: Not on file  . Stress: Not on file  Relationships  . Social connections:    Talks on phone: Not on file    Gets together: Not on file    Attends religious service: Not on file    Active member of club or organization: Not on file    Attends meetings of clubs or organizations: Not on file    Relationship status: Not on file  Other Topics Concern  . Not on file  Social History Narrative   Lives with mother currently, goddaughter and uncle in a one story home.     On disability for diabetes, neuropathy, sleep apnea etc.  Disability since 2014.    Education: 11th grade.    Family History  Problem Relation Age of Onset  . Heart failure Mother   . Diabetes Mother   . Kidney disease Mother   . Kidney disease Father   . Diabetes Father   . Diabetes Paternal Grandmother   . Heart failure Paternal Grandmother   . Other Other    - negative except otherwise stated in the family history section Allergies  Allergen Reactions  . Vancomycin Other (See Comments)    Acute renal failure suspected secondary to vanco   Prior to Admission medications   Medication Sig Start Date End Date Taking? Authorizing Provider  amitriptyline (ELAVIL) 50 MG tablet TAKE 1 TABLET BY MOUTH AT BEDTIME Patient taking differently: Take 50 mg by mouth at bedtime.  10/31/17  Yes Oda Kilts, MD  atorvastatin (LIPITOR) 20 MG tablet TAKE 1 TABLET BY MOUTH EVERY DAY Patient taking differently: Take 20 mg by mouth daily.   03/01/18  Yes Helberg, Larkin Ina, MD  bumetanide (BUMEX) 1 MG tablet Take 1 tablet (1 mg total) by mouth 2 (two) times daily. 08/28/17  Yes Oval Linsey, MD  cyclobenzaprine (FLEXERIL) 10 MG tablet TAKE 1 TABLET BY MOUTH EVERY TWELVE HOURS AS NEEDED for MUSCLE SPASMS Patient taking differently: Take 10 mg by mouth 3 (three) times daily as needed for muscle spasms.  03/07/18  Yes Helberg, Larkin Ina, MD  diclofenac sodium (VOLTAREN) 1 % GEL Apply 4 grams to affected area  4 times daily as needed for pain Patient taking differently: Apply 4 g topically 4 (four) times daily as needed (pain).  01/30/18  Yes Helberg, Larkin Ina, MD  docusate sodium (COLACE) 100 MG capsule Take 1 capsule (100 mg total) by mouth 2 (two) times daily. Patient taking differently: Take 100 mg by mouth 2 (two) times daily as needed for moderate constipation.  04/25/17  Yes Angiulli, Lavon Paganini, PA-C  FLUoxetine (PROZAC) 20 MG/5ML solution Take 15 mLs (60 mg total) by mouth daily. 01/03/18  Yes Molt, Bethany, DO  ibuprofen (ADVIL) 200 MG tablet Take 3 tablets (600 mg total) by mouth every 8 (eight) hours as needed for moderate pain (Take with food and water). 01/29/18 01/29/19 Yes Masoudi, Elhamalsadat, MD  insulin aspart (NOVOLOG FLEXPEN) 100 UNIT/ML FlexPen Inject 13 Units into the skin 3 (three) times daily with meals. Patient taking differently: Inject 15 Units into the skin 3 (three) times daily with meals.  09/07/17  Yes Colbert Ewing, MD  insulin glargine (LANTUS) 100 unit/mL SOPN Inject 0.5 mLs (50 Units total) into the skin 2 (two) times daily. Patient taking differently: Inject 40 Units into the skin 2 (two) times daily.  09/07/17  Yes Colbert Ewing, MD  LYRICA 100 MG capsule TAKE 1 CAPSULE BY MOUTH 3 TIMES DAILY Patient taking differently: Take 100 mg by mouth 3 (three) times daily.  01/02/18  Yes Helberg, Larkin Ina, MD  mupirocin ointment (BACTROBAN) 2 % Apply 1 application topically 2 (two) times daily. Patient taking differently: Apply  1 application topically 2 (two) times daily as needed (skin care).  06/28/17  Yes Dondra Prader R, NP  nystatin (MYCOSTATIN/NYSTOP) powder Apply topically 4 (four) times daily. After cleaning and drying the affected area for two weeks Patient taking differently: Apply 1 g topically 4 (four) times daily as needed (skin care). After cleaning and drying the affected area for two weeks 03/12/18  Yes Kathi Ludwig, MD  oxyCODONE-acetaminophen (PERCOCET/ROXICET) 5-325 MG tablet Take 1 tablet by mouth every 6 (six) hours as needed for severe pain. 03/28/18  Yes Dondra Prader R, NP  polyethylene glycol (MIRALAX / GLYCOLAX) packet Take 17 g by mouth daily. Patient taking differently: Take 17 g by mouth daily as needed for moderate constipation.  04/25/17  Yes Angiulli, Lavon Paganini, PA-C  ULTICARE INSULIN SYRINGE 31G X 1/4" 0.5 ML MISC Use to inject insulin as directed 03/06/18  Yes Helberg, Larkin Ina, MD  VICTOZA 18 MG/3ML SOPN Inject 0.3 mLs (1.8 mg total) into the skin daily. 01/30/18  Yes Ina Homes, MD  fluconazole (DIFLUCAN) 150 MG tablet Please take one tablet every three days (72 hours apart) until completed Patient not taking: Reported on 04/03/2018 03/12/18   Kathi Ludwig, MD   Dg Chest Port 1 View  Result Date: 04/03/2018 CLINICAL DATA:  Acute onset of shortness of breath. Sepsis. EXAM: PORTABLE CHEST 1 VIEW COMPARISON:  Chest radiograph performed 04/17/2017 FINDINGS: The lungs are hypoexpanded. Vascular congestion is noted. Increased interstitial markings raise concern for mild pulmonary edema. There is no evidence of pleural effusion or pneumothorax. The cardiomediastinal silhouette is within normal limits. No acute osseous abnormalities are seen. IMPRESSION: Lungs hypoexpanded. Vascular congestion noted. Increased interstitial markings raise concern for mild pulmonary edema. Electronically Signed   By: Garald Balding M.D.   On: 04/03/2018 04:06   [place] Soft Tissue Non  Vascular  Result Date: 04/03/2018 CLINICAL DATA:  BKA stump complication. EXAM: ULTRASOUND LEFT LOWER EXTREMITY LIMITED TECHNIQUE: Ultrasound examination of the lower  extremity soft tissues was performed in the area of clinical concern. COMPARISON:  No prior. FINDINGS: Ultrasound the left BKA stump reveals no focal mass or fluid collection. Diffuse prominent soft tissue edema noted. IMPRESSION: Diffuse prominent soft tissue edema. No focal fluid collections or mass lesions. Electronically Signed   By: Marcello Moores  Register   On: 04/03/2018 10:54   - pertinent xrays, CT, MRI studies were reviewed and independently interpreted  Positive ROS: All other systems have been reviewed and were otherwise negative with the exception of those mentioned in the HPI and as above.  Physical Exam: General: Alert, no acute distress Psychiatric: Patient is competent for consent with normal mood and affect Lymphatic: No axillary or cervical lymphadenopathy Cardiovascular: No pedal edema Respiratory: No cyanosis, no use of accessory musculature GI: No organomegaly, abdomen is soft and non-tender    Images:  @ENCIMAGES @  Labs:  Lab Results  Component Value Date   HGBA1C 11.4 (A) 11/30/2017   HGBA1C 9.6 09/07/2017   HGBA1C 6.5 (H) 06/09/2017   ESRSEDRATE 130 (H) 03/24/2017   ESRSEDRATE 53 (H) 09/14/2015   ESRSEDRATE 132 (H) 07/06/2012   CRP 38.6 (H) 04/03/2018   CRP 5.4 (H) 09/14/2015   CRP 23.0 (H) 07/06/2012   REPTSTATUS PENDING 04/03/2018   GRAMSTAIN  08/11/2017    MODERATE WBC PRESENT, PREDOMINANTLY PMN FEW GRAM POSITIVE COCCI    CULT GRAM POSITIVE COCCI 04/03/2018   LABORGA ENTEROCOCCUS FAECALIS 06/09/2017    Lab Results  Component Value Date   ALBUMIN 1.6 (L) 04/03/2018   ALBUMIN 1.9 (L) 08/11/2017   ALBUMIN 2.9 (L) 06/09/2017    Neurologic: Patient does not have protective sensation bilateral lower extremities.   MUSCULOSKELETAL:   Skin: Examination patient does have swelling of  the left lower extremity there is some clear drainage from the leg but this appears to be secondary to the swelling.  She has exquisite tenderness to palpation around the residual limb however I cannot palpate any fluctuance there is no knee effusion.  There is no ascending cellulitis.  Patient has uncontrolled type 2 diabetes with a hemoglobin A1c of 11.4 and severe protein caloric malnutrition with an albumin of 1.6.  Assessment: Assessment: Sepsis with increased pain left transtibial amputation.  Plan: Plan: I cannot determine clinically at what level we would need to proceed with revision amputation.  I will order an MRI scan to further evaluate the residual limb and will cancel surgery for today with anticipation of surgery on Friday.  Thank you for the consult and the opportunity to see Ms. Manor, MD [place] [phone] 7:07 AM

## 2018-04-04 NOTE — Progress Notes (Signed)
Internal Medicine Attending:   I saw and examined the patient. I reviewed the resident's note and I agree with the resident's findings and plan as documented in the resident's note.  Principal Problem:   Sepsis ([place]) Active Problems:   Uncontrolled type 2 diabetes mellitus (HCC)   OSA (obstructive sleep apnea)   Chronic kidney disease (CKD), stage III (moderate) (HCC)   Status post below knee amputation, left (HCC)   BKA stump complication (HCC)   Severe protein-calorie malnutrition ([place])   Bacteremia due to group B Streptococcus   Hospital day #2 with sepsis due to group B strep bacteremia due to a likely left stump infection. Her status is mildly improved today, but prognosis remains gaurded. She is still tachycardic this morning, appears dehydrated, still having fevers, and renal function a little worse today. Had three liters of LR yesterday, will give another liter of LR today. Will continue medically treating sepsis with Dapto and Cefepime. The stump infection is at risk for a polymicrobial infection, so I do not want to de-escalate antibiotics to just penicillin until after surgical debridement of the stump. I agree with the plan for MRI of the left stump today, and then plan for OR debridement with Dr. Sharol Given.   Lalla Brothers, MD

## 2018-04-04 NOTE — Progress Notes (Signed)
Subjective:  She continues to have pain in her left stump that is making her very uncomfortable. She states she feels more aware than yesterday. She denies nausea and is hungry. She has some SOB when she becomes stressed or her stump starts to hurt a lot but otherwise does not feel dyspneic.  She states she has not been taking care of herself recently as one of her friends passed away last year and her mother passed away in May 26, 2023. She knows that she needs to take better care of herself and take her medications. She states she has been depressed but does not have SI or thoughts of hurting herself. She would like to speak to the chaplain.   Objective:  Vital signs in last 24 hours: Vitals:   04/03/18 2230 04/03/18 2345 04/04/18 0517 04/04/18 0817  BP:  132/67 97/62   Pulse: (!) 121 (!) 122 (!) 116 (!) 121  Resp: (!) 28 (!) 27 20 19   Temp: 99.3 F (37.4 C) (!) 101.6 F (38.7 C) 99.8 F (37.7 C) (!) 100.8 F (38.2 C)  TempSrc: Oral Axillary Oral Oral  SpO2: 98% 97% 96% 96%  Weight:      Height:       Physical Exam  Constitution: mild distress, lying supine in bed HEENT: dry mucous membranes Cardio: tachycardic, no m/r/g Respiratory: Course breath sounds, no wheezing, rales, rhonchi Abdominal: soft, NTTP MSK: LLE stump warm, edematous and diffusely TTP, no drainage or abrasion Neuro: tearful, depressed mood, a&o, cooperative Skin: c/d/i    Assessment/Plan:  Principal Problem:   Sepsis ([place]) Active Problems:   Uncontrolled type 2 diabetes mellitus (HCC)   OSA (obstructive sleep apnea)   Chronic kidney disease (CKD), stage III (moderate) (HCC)   Status post below knee amputation, left (HCC)   BKA stump complication ([place])   Severe protein-calorie malnutrition ([place])  42yo female with PMH HFpEF, left BKA 03/2017 with 05/2017 revision, recurrent abscesses, TIIDM, OSA, and depression presenting to the ED with sepsis and left stump pain after several days of beginning to use  her prosthetic.  Sepsis 2/2 Left BKA Stump  Blood cultures positive for GBS. Dr. Sharol Given consulted and will plan for likely surgery on Friday pending MRI today. She continues to have pain in the area of her stump with no noted purulent drainage but significant diffuse edema and is dry on exam. She has not been eating well since the death of her mother in 05-26-23 and is hypoalbuminemic. Noted pharmacy's note on possibly switching to PCN as her blood culture are only significant for GBS at this time. As she presented with hyperglycemia and has not been taking her medications, source is likely polymicrobial. We will continue broad coverage pending surgery and sensitivities.    - MRI today  - OR likely 11/15 with Dr. Sharol Given  - cont. Cefepime, daptomycin  - f/u sensitivities - LR 100cc/hr for five hours  - consult to nutrition, she will need to increase her nutrition to encourage healing s/p surgery  - dilaudid 2mg  q4h prn   DKA Resolved although she continues to have elevated glucose. She has been depressed recently over the death of her mother and has not been taking her diabetes medications.   - increase to 25U lantus - 5U novolog ac - SSI moderate with evening SSI  - CBG Z7Q  Chronic Diastolic Heart Failure 3L LR and .5L NS. She continues to be volume down with dry mucous membranes although blood pressures are improving. We will  continue to monitor for volume overload.   AKI on CKD Stage IIIa/b Hypovolemic at admission with sepsis. Continuing IV fluids and placed on po diet as well.   - am BMP   Major Depressive Disorder  She has been feeling more depressed since the death of her mother in 2023-06-03 and has not been taking her medications or eating. No thoughts of hurting herself.   - cont. prozac - consult to chaplain  - discuss referral to [facility] counselor at discharge   Anemia of Chronic Disease Hgb stable, 7.7. Baseline ~ 8.   - am CBC   OSA Cont. CPAP    VTE: heparin IVF: LR  100cc/hr 5 hours Diet: heart healthy/carb modified Code: Full   Dispo: Anticipated discharge pending resolution of sepsis and surgery.    Molli Hazard A, DO 04/04/2018, 11:07 AM Pager: [phone]

## 2018-04-04 NOTE — Progress Notes (Signed)
   04/04/18 1600  Clinical Encounter Type  Visited With Patient not available;Patient;Health care provider  Visit Type Initial;Psychological support  Referral From Nurse  Stress Factors  Patient Stress Factors Exhausted   Responded to [facility] consult noting that pt's mother had died in Jun 09, 2023 and pt said she hasn't been taking care of herself since.  Per RN, there have been several difficult circumstances for the pt in the interim, including an amputation.    Pt appeared to be sleeping when chaplain entered the room.  Spoke "hello" multiple times, then pt awoke but was rather drowsy.  Attempted to ask pt if she wanted me to stay or to return later, but she fell back asleep so I departed room and updated Vicente Males, Therapist, sports.   Pls enter new consult for chaplain to return when pt is feeling less drowsy.  Myra Gianotti resident, [phone]

## 2018-04-04 NOTE — Progress Notes (Addendum)
Inpatient Diabetes Program Recommendations  AACE/ADA: New Consensus Statement on Inpatient Glycemic Control (2015)  Target Ranges:  Prepandial:   less than 140 mg/dL      Peak postprandial:   less than 180 mg/dL (1-2 hours)      Critically ill patients:  140 - 180 mg/dL   Lab Results  Component Value Date   GLUCAP 206 (H) 04/04/2018   HGBA1C 11.4 (A) 11/30/2017    Review of Glycemic Control Results for [name] [name], [name] [name] (MRN [id number]) as of 04/04/2018 11:15  Ref. Range 04/03/2018 22:02 04/04/2018 06:52 04/04/2018 10:44  Glucose-Capillary Latest Ref Range: 70 - 99 mg/dL 246 (H) 243 (H) 206 (H)   Diabetes history: DM 2 Outpatient Diabetes medications: Lantus 40 units BID, Novolog 15 units tid meal coverage, Victoza 1.8 mg Daily Current orders for Inpatient glycemic control: Lantus 25 units QHS, Novolog 5 units TID, Novolog 0-15 units TID, Novolog 0-5 units QHS  Last A1c 11.4% on 11/2017  Inpatient Diabetes Program Recommendations:    Consider repeating A1c level.  Noted updated orders. In agreement. Will plan to see patient today as A1C is assumingly higher than previous value, as patient admits to not taking medications.  Addendum @1345 : Spoke with patient and family regarding outpatient diabetes management. Patient states "I just haven't been taking good care of myself. Sometimes I would take my insulin and the majority of the time I just didn't."  Reviewed patient's last A1c of 11.4% and explained that I was assuming given poor adherence to prescribed insulin that this is now higher. Explained what a A1c is and what it measures. Also reviewed goal A1c with patient, importance of good glucose control @ home, and blood sugar goals. Reviewed patho of DM, role of pancreas, need for insulin, impact of poor glycemic control with infection, vascular changes, and additional comorbidites. Patient will need a meter at discharge. Blood glucose meter (includes lancets and strips)  ([id number]). Encouraged to restart checking 2-4 times daily in an attempt to establish better control and communicate concerns with MD. Reviewed when to call MD. Patient starts feeling low at 200's so worked towards establishing goals to gain better control. Has other family members to help.  Encouraged patient to follow up with PCP and endocrinologist. Patient and fmaily have no further questions at this time. Will continue to follow.  Thanks, Bronson Curb, MSN, RNC-OB Diabetes Coordinator [phone] (8a-5p)

## 2018-04-04 NOTE — Progress Notes (Signed)
Pharmacy Antibiotic Note  [name] [name] is a 42 y.o. female admitted on 04/03/2018 with sepsis secondary to cellulitis of left BKA.  Pharmacy has been consulted for daptomycin and cefepime dosing. Of note, 1 of 2 blood culture growing Group B Strep. CK elevated at 441 so will need to watch if continues on daptomycin. SCr up to 2.17.  Plan: Daptomycin 900 mg IV q24h (~6mg /kg) Change cefepime to 2 gm IV q12h Monitor renal function, Cx, LOT Monitor CK weekly Recommend narrow to ampicillin (or Unasyn for more broad coverage)   Height: 5\' 10"  (177.8 cm) Weight: (!) 337 lb 4.9 oz (153 kg) IBW/kg (Calculated) : 68.5  Temp (24hrs), Avg:101 F (38.3 C), Min:99.3 F (37.4 C), Max:102.2 F (39 C)  Recent Labs  Lab 04/03/18 0350 04/03/18 0400 04/03/18 0655 04/03/18 1354 04/04/18 0311  WBC 6.2  --   --   --  7.4  CREATININE 1.61*  --   --  1.67* 2.17*  LATICACIDVEN  --  1.61 1.13  --   --     Estimated Creatinine Clearance: 55.1 mL/min (A) (by C-G formula based on SCr of 2.17 mg/dL (H)).    Allergies  Allergen Reactions  . Vancomycin Other (See Comments)    Acute renal failure suspected secondary to vanco    Antimicrobials this admission: Clinda x 1 Cefepime 11/12>> Daptomycin 11/12>>   Microbiology results: 11/12 BCx: 1 of 2 with Grp B Strep  Thank you for allowing pharmacy to be a part of this patient's care.  Renold Genta, PharmD, BCPS Clinical Pharmacist Clinical phone for 04/04/2018 until 3p is [phone] 04/04/2018 1:16 PM  **Pharmacist phone directory can now be found on [email or web address] listed under [place]**

## 2018-04-05 DIAGNOSIS — R7881 Bacteremia: Secondary | ICD-10-CM

## 2018-04-05 DIAGNOSIS — B951 Streptococcus, group B, as the cause of diseases classified elsewhere: Secondary | ICD-10-CM

## 2018-04-05 LAB — GLUCOSE, CAPILLARY
GLUCOSE-CAPILLARY: 244 mg/dL — AB (ref 70–99)
GLUCOSE-CAPILLARY: 216 mg/dL — AB (ref 70–99)
GLUCOSE-CAPILLARY: 209 mg/dL — AB (ref 70–99)
Glucose-Capillary: 241 mg/dL — ABNORMAL HIGH (ref 70–99)

## 2018-04-05 LAB — BASIC METABOLIC PANEL
Anion gap: 10 (ref 5–15)
Anion gap: 8 (ref 5–15)
BUN: 35 mg/dL — ABNORMAL HIGH (ref 6–20)
BUN: 38 mg/dL — AB (ref 6–20)
CHLORIDE: 98 mmol/L (ref 98–111)
CHLORIDE: 100 mmol/L (ref 98–111)
CO2: 17 mmol/L — ABNORMAL LOW (ref 22–32)
CO2: 17 mmol/L — ABNORMAL LOW (ref 22–32)
CREATININE: 2.7 mg/dL — AB (ref 0.44–1.00)
Calcium: 8.1 mg/dL — ABNORMAL LOW (ref 8.9–10.3)
Calcium: 8.3 mg/dL — ABNORMAL LOW (ref 8.9–10.3)
Creatinine, Ser: 2.72 mg/dL — ABNORMAL HIGH (ref 0.44–1.00)
GFR calc Af Amer: 24 mL/min — ABNORMAL LOW (ref >60)
GFR calc non Af Amer: 21 mL/min — ABNORMAL LOW (ref >60)
GFR, EST AFRICAN AMERICAN: 24 mL/min — AB (ref >60)
GFR, EST NON AFRICAN AMERICAN: 21 mL/min — AB (ref >60)
GLUCOSE: 254 mg/dL — AB (ref 70–99)
Glucose, Bld: 273 mg/dL — ABNORMAL HIGH (ref 70–99)
POTASSIUM: 4.2 mmol/L (ref 3.5–5.1)
POTASSIUM: 4.1 mmol/L (ref 3.5–5.1)
SODIUM: 125 mmol/L — AB (ref 135–145)
SODIUM: 125 mmol/L — AB (ref 135–145)

## 2018-04-05 LAB — CBC
HCT: 24.4 % — ABNORMAL LOW (ref 36.0–46.0)
HEMOGLOBIN: 7.3 g/dL — AB (ref 12.0–15.0)
MCH: 23.9 pg — ABNORMAL LOW (ref 26.0–34.0)
MCHC: 29.9 g/dL — ABNORMAL LOW (ref 30.0–36.0)
MCV: 79.7 fL — ABNORMAL LOW (ref 80.0–100.0)
Platelets: 213 10*3/uL (ref 150–400)
RBC: 3.06 MIL/uL — AB (ref 3.87–5.11)
RDW: 15.1 % (ref 11.5–15.5)
WBC: 8 10*3/uL (ref 4.0–10.5)
nRBC: 0.5 % — ABNORMAL HIGH (ref 0.0–0.2)

## 2018-04-05 LAB — CULTURE, BLOOD (ROUTINE X 2)

## 2018-04-05 LAB — CK: CK TOTAL: 659 U/L — AB (ref 38–234)

## 2018-04-05 LAB — MAGNESIUM: MAGNESIUM: 2.1 mg/dL (ref 1.7–2.4)

## 2018-04-05 LAB — HEMOGLOBIN A1C
HEMOGLOBIN A1C: 15.5 % — AB (ref 4.8–5.6)
Mean Plasma Glucose: 398.15 mg/dL

## 2018-04-05 MED ORDER — LACTATED RINGERS IV SOLN
INTRAVENOUS | Status: DC
  Administered 2018-04-05 – 2018-04-06 (×3): via INTRAVENOUS

## 2018-04-05 MED ORDER — SODIUM CHLORIDE 0.9 % IV SOLN
2.0000 g | INTRAVENOUS | Status: DC
  Administered 2018-04-05 – 2018-04-10 (×6): 2 g via INTRAVENOUS
  Filled 2018-04-05 (×7): qty 20

## 2018-04-05 MED ORDER — ONDANSETRON HCL 4 MG/2ML IJ SOLN
4.0000 mg | Freq: Four times a day (QID) | INTRAMUSCULAR | Status: DC | PRN
  Administered 2018-04-05 – 2018-04-06 (×3): 4 mg via INTRAVENOUS
  Filled 2018-04-05 (×3): qty 2

## 2018-04-05 MED ORDER — INSULIN GLARGINE 100 UNIT/ML ~~LOC~~ SOLN
20.0000 [IU] | Freq: Two times a day (BID) | SUBCUTANEOUS | Status: DC
  Administered 2018-04-05 – 2018-04-10 (×10): 20 [IU] via SUBCUTANEOUS
  Filled 2018-04-05 (×12): qty 0.2

## 2018-04-05 MED ORDER — FLUOXETINE HCL 20 MG/5ML PO SOLN
60.0000 mg | Freq: Every day | ORAL | Status: DC
  Administered 2018-04-05 – 2018-04-10 (×6): 60 mg via ORAL
  Filled 2018-04-05 (×6): qty 15

## 2018-04-05 MED ORDER — PRO-STAT SUGAR FREE PO LIQD
30.0000 mL | Freq: Two times a day (BID) | ORAL | Status: DC
  Administered 2018-04-05 – 2018-04-10 (×10): 30 mL via ORAL
  Filled 2018-04-05 (×10): qty 30

## 2018-04-05 NOTE — Progress Notes (Signed)
Initial Nutrition Assessment  DOCUMENTATION CODES:   Morbid obesity  INTERVENTION:    Prostat liquid protein po 30 ml BID with meals, each supplement provides 100 kcal, 15 grams protein  NUTRITION DIAGNOSIS:   Increased nutrient needs related to chronic illness, wound healing as evidenced by estimated needs  GOAL:   Patient will meet greater than or equal to 90% of their needs  MONITOR:   PO intake, Supplement acceptance, Labs, Skin, Weight trends, I & O's  REASON FOR ASSESSMENT:   Consult Assessment of nutrition requirement/status, Wound healing  ASSESSMENT:   42 yo Female with PMH of L transtibial amputation (03/2017; revision 05/2017), CKD2, uncontrolled DM2, peripheral neuropathy, fibromyalgia, bipolar disorder; admitted with sepsis secondary to cellulitis of left BKA.   Pt not feeling well upon RD visit. Emesis bag on tray table. RD deferred interview to obtain nutrition history. PO intake 50-100% per flowsheet records.  Pt would benefit from addition of liquid protein supplement. Labs & medications reviewed. Na 125 (L). CBG's [phone].  NUTRITION - FOCUSED PHYSICAL EXAM:  Deferred at this time.  Diet Order:   Diet Order            Diet heart healthy/carb modified Room service appropriate? Yes; Fluid consistency: Thin  Diet effective now             EDUCATION NEEDS:   Not appropriate for education at this time  Skin:  Skin Assessment: Skin Integrity Issues: Skin Integrity Issues:: Incisions Incisions: L knee  Last BM:  11/12  Height:   Ht Readings from Last 1 Encounters:  04/03/18 5\' 10"  (1.778 m)   Weight:   Wt Readings from Last 1 Encounters:  04/05/18 (!) 152 kg   BMI:  51.6 kg/m2 (adjusted for BKA)  Estimated Nutritional Needs:   Kcal:  2000-2200  Protein:  100-115 gm  Fluid:  2.0-2.2 L  Arthur Holms, RD, LDN Pager #: [phone] After-Hours Pager #: [phone]

## 2018-04-05 NOTE — Progress Notes (Signed)
Patient ID: [name] [name], female   DOB: July 21, 1975, 42 y.o.   MRN: [id number] Patient complains of GI upset she did have increased dose of Dilaudid last night.  I reviewed the MRI scan this does show edema shows a little bit of fluid over the end of the residual limb but no osteomyelitis no abscess.  She does have degenerative osteoarthritis of the knee with a effusion.  Do not see an indication for surgical intervention for the left transtibial amputation.  Patient has missed her appointments with [facility] I will request [facility] to come by to put her in a stump shrinker for the left leg.  Discussed the importance of compliance with wearing the stump shrinker.  Discussed with the patient the importance of therapy, mobility, discontinued narcotics for pain control, discussed proper diet.  I will follow-up in the office in 1 week.

## 2018-04-05 NOTE — Progress Notes (Signed)
Inpatient Rehabilitation Admissions Coordinator  Rehab Admissions Coordinator Note:  Patient was screened by Cleatrice Burke for appropriateness for an Inpatient Acute Rehab Consult per PT recommendation. Noted limited by severe pain.   At this time, we are recommending Inpatient Rehab consult if pt fails to progress functionally once pain improved. Danne Baxter, RN, MSN Rehab Admissions Coordinator [phone] 04/05/2018 11:03 AM

## 2018-04-05 NOTE — Progress Notes (Addendum)
   Subjective:  She is having pain this morning in her stump but is feeling overall better. She states she has increased cough with clear white sputum and is feeling a little SOB today. She denies nausea but feels warm in her abdomen.  Discussed the results of her MRI with her and Dr. Sharol Given and that it would be important to start moving more to help improve her weakness and LLE edema and that a lot of her pain was due to the swelling in her leg and the surrounding arthritis.   Objective:  Vital signs in last 24 hours: Vitals:   04/05/18 0547 04/05/18 0600 04/05/18 0724 04/05/18 0754  BP: 108/69 100/62  114/76  Pulse: (!) 103 100  (!) 102  Resp: (!) 24 18  20   Temp: 98.4 F (36.9 C) 98.5 F (36.9 C)  98 F (36.7 C)  TempSrc: Oral Oral  Oral  SpO2: 100% 100%  100%  Weight:   (!) 152 kg   Height:       Physical Exam:   Constitution: NAD, lying supine in bed, obese HENT: [place] in place  Eyes: EOM intact, no scleral icterus  Cardio: tachycardic, RRR Respiratory: non-labored breathing, CTAB, no wheezing Abdominal: NTTP, +BS Neuro: A&O,  Skin: RLE venous stasis skin changes, +1 pitting edema, LLE BKA edematous, TTP   Assessment/Plan:  Principal Problem:   Sepsis (HCC) Active Problems:   Uncontrolled type 2 diabetes mellitus (HCC)   OSA (obstructive sleep apnea)   Chronic kidney disease (CKD), stage III (moderate) (HCC)   Status post below knee amputation, left (HCC)   BKA stump complication (HCC)   Severe protein-calorie malnutrition ([place])   Bacteremia due to group B Streptococcus   Group B Strep Bacteremia Patient improved overall from yesterday with no fever overnight. She continues to have tachycardia and tachypnea. MRI left leg s/p BKA showed no abscess or osteo but diffuse edema and tissue swelling, which does not necessitate surgery. Considering the recently seen drainage, swelling, and increased activity the source of bacteremia is likely from her stump with pain out of  proportion due to her arthritis and edema. Dr. Sharol Given has placed an order with [facility] for stump shrinker to help with edema. She will need two weeks treatment total with IV abx for her bacteremia.    - switch to Ceftriaxone IV - PICC line once symptoms and AKI improved - IVF 125cc/hr - afternoon BMP - f/u sensitivities - repeat blood culture today   - am CBC, BMP  - dilaudid 1mg  q4h, tylenol prn  - PT/OT ordered - recommending CIR and have placed consult  AKI on CKD Secondary to hypovolemia and sepsis.   - increase fluids to 125cc/hr LR - repeat afternoon BMP   Uncontrolled TIIDM Glucose continues to be elevated   - inc. lantus to 20 mg bid  - cont. novolog TID 5U - cont. Moderate SSI  MDD  - cont. prozac 20mg  qd  VTE: heparin IVF: 125cc/hr LR Diet: heart healthy/carb modified Code: Full   Dispo: Anticipated discharge in approximately 5-6 days.   Molli Hazard A, DO 04/05/2018, 8:52 AM Pager: [phone]

## 2018-04-05 NOTE — Evaluation (Signed)
Physical Therapy Evaluation Patient Details Name: [name] [name] MRN: [id number] DOB: [birth year] Today's Date: 04/05/2018   History of Present Illness  Pt is a 42 y.o. female admitted 04/03/18 with fever, tachycardia and severe L stump pain; worked up for L stump infection with borderline sepsis. MRI of LLE shows no evidence of steomyelitis; severe edema, tricompartmental OA of L knee. Per ortho, no indication for surgical intervention. PMH includes L transtibial amputation (03/2017; revision 05/2017), CKD2, OSA, uncontrolled DM2, peripheral neuropathy, fibromyalgia, bipolar disorder, anxiety.    Clinical Impression  Pt presents with an overall decrease in functional mobility secondary to above. PTA, pt lives at home with nephew; up until days immediately PTA, pt mod indep with all mobility and ADLs; uses wheelchair and RW for household ambulation; recently received prosthetic she has been using for community ambulation. Today, pt required maxA to perform lateral scoot transfer to recliner. Pt significantly limited by c/o severe L residual limb pain with all movement. Despite this, pt willing to participate and motivated to get OOB. Recommend nursing staff use maximove for future transfers. Noted pt received initial amputation 03/2017 and was d/c to CIR for post-acute rehab. Pt would benefit from continued acute PT services to maximize functional mobility and independence prior to d/c with CIR-level therapies.     Follow Up Recommendations CIR;Supervision for mobility/OOB    Equipment Recommendations  (TBD)    Recommendations for Other Services Rehab consult;OT consult     Precautions / Restrictions Precautions Precautions: Fall Restrictions Weight Bearing Restrictions: No      Mobility  Bed Mobility Overal bed mobility: Needs Assistance Bed Mobility: Supine to Sit     Supine to sit: Min assist;HOB elevated     General bed mobility comments: Significant increased time and  effort, moaning throughout secondary to LLE pain; heavy reliance on bed rail to pull hips to EOB. Once in chair, maxA for rolling and scooting up with recliner flat  Transfers Overall transfer level: Needs assistance Equipment used: None Transfers: Lateral/Scoot Transfers           General transfer comment: Initially attempted squat pivot from bed to drop arm recliner, but pt reports numbness in RLE and unable to bear weight; unable to clear bottom with BUE support. Performed lateral scoot transfer with maxA; pt extremely painful throughout. MaxA for repositioning once in chair  Ambulation/Gait                Stairs            Wheelchair Mobility    Modified Rankin (Stroke Patients Only)       Balance Overall balance assessment: Needs assistance   Sitting balance-Leahy Scale: Fair       Standing balance-Leahy Scale: Zero                               Pertinent Vitals/Pain Pain Assessment: Faces Faces Pain Scale: Hurts worst Pain Location: L residual limb Pain Descriptors / Indicators: Constant;Grimacing;Guarding;Moaning;Numbness;Shooting;Sharp Pain Intervention(s): Monitored during session;Limited activity within patient's tolerance;Repositioned    Home Living Family/patient expects to be discharged to:: Private residence Living Arrangements: Other relatives(nephew) Available Help at Discharge: Family;Available PRN/intermittently Type of Home: House Home Access: Stairs to enter Entrance Stairs-Rails: Psychiatric nurse of Steps: 2 Home Layout: One level Home Equipment: Walker - 2 wheels;Wheelchair - manual Additional Comments: Ramp was never built; pt navigates stairs into home.     Prior Function Level of  Independence: Independent with assistive device(s)   Gait / Transfers Assistance Needed: Uses RW a good bit around the house without prosthetic; recently got prosthetic, but pt reports too big, so she only wears it in  community. Mod indep with w/c mobility. Ascends/descends steps into home without assist. Able to get w/c into and out of car without assist  ADL's / Homemaking Assistance Needed: Reports mod indep with ADLs  Comments: Mourning loss of mother who passed 04/2017 who was pt's main support system     Hand Dominance        Extremity/Trunk Assessment   Upper Extremity Assessment Upper Extremity Assessment: Generalized weakness    Lower Extremity Assessment Lower Extremity Assessment: RLE deficits/detail;LLE deficits/detail RLE Deficits / Details: Pt reports "I cannot feel below my belly" (h/o peripheral neuropathy; light touch in tact to at least knee level); grossly 3/5 throughout RLE Sensation: decreased light touch;history of peripheral neuropathy RLE Coordination: decreased fine motor;decreased gross motor LLE Deficits / Details: L residual limb <3/5; limited by severe pain; pt report numbness as well LLE: Unable to fully assess due to pain LLE Sensation: history of peripheral neuropathy;decreased light touch LLE Coordination: decreased fine motor;decreased gross motor       Communication   Communication: No difficulties  Cognition Arousal/Alertness: Awake/alert Behavior During Therapy: WFL for tasks assessed/performed Overall Cognitive Status: Within Functional Limits for tasks assessed                                        General Comments General comments (skin integrity, edema, etc.): SpO2 >90% on RA    Exercises     Assessment/Plan    PT Assessment Patient needs continued PT services  PT Problem List Decreased strength;Decreased range of motion;Decreased activity tolerance;Decreased balance;Decreased mobility;Impaired sensation;Obesity       PT Treatment Interventions DME instruction;Gait training;Stair training;Functional mobility training;Therapeutic activities;Therapeutic exercise;Balance training;Patient/family education;Wheelchair mobility  training    PT Goals (Current goals can be found in the Care Plan section)  Acute Rehab PT Goals Patient Stated Goal: "I can't go home like this... I need less pain so I can move" PT Goal Formulation: With patient Time For Goal Achievement: 04/19/18 Potential to Achieve Goals: Fair    Frequency Min 3X/week   Barriers to discharge Decreased caregiver support;Inaccessible home environment      Co-evaluation               AM-PAC PT "6 Clicks" Daily Activity  Outcome Measure Difficulty turning over in bed (including adjusting bedclothes, sheets and blankets)?: Unable Difficulty moving from lying on back to sitting on the side of the bed? : Unable Difficulty sitting down on and standing up from a chair with arms (e.g., wheelchair, bedside commode, etc,.)?: Unable Help needed moving to and from a bed to chair (including a wheelchair)?: A Lot Help needed walking in hospital room?: Total Help needed climbing 3-5 steps with a railing? : Total 6 Click Score: 7    End of Session   Activity Tolerance: Patient limited by pain Patient left: in chair;with call bell/phone within reach Nurse Communication: Mobility status PT Visit Diagnosis: Other abnormalities of gait and mobility (R26.89);Muscle weakness (generalized) (M62.81);Pain Pain - Right/Left: Left Pain - part of body: Leg    Time: 1856-3149 PT Time Calculation (min) (ACUTE ONLY): 39 min   Charges:   PT Evaluation $PT Eval Moderate Complexity: 1 Mod PT Treatments $Therapeutic  Activity: 23-37 mins      Mabeline Caras, [place], DPT Acute Rehabilitation Services  Pager [phone] Office [place] 04/05/2018, 10:48 AM

## 2018-04-05 NOTE — Progress Notes (Signed)
Internal Medicine Attending:   I saw and examined the patient. I reviewed the resident's note and I agree with the resident's findings and plan as documented in the resident's note.  Principal Problem:   Sepsis ([place]) Active Problems:   Uncontrolled type 2 diabetes mellitus (HCC)   OSA (obstructive sleep apnea)   Chronic kidney disease (CKD), stage III (moderate) (HCC)   Status post below knee amputation, left (HCC)   BKA stump complication (HCC)   Severe protein-calorie malnutrition ([place])   Bacteremia due to group B Streptococcus  Hospital day #3 with sepsis due to group B strep bacteremia.  I think the source of the infection is cellulitis of the left stump.  Fever curve is improving, no leukocytosis, she looks a little better today but still struggling with pain.  MRI is reassuring with no signs of deep tissue abscess, osteomyelitis, no current need for operative debridement.  At this point seems low risk for necrotizing soft tissue infection.  I agree, okay to de-escalate antibiotics to just ceftriaxone today to cover the strep bacteremia.  This will need to be a 2-week course.  Lalla Brothers, MD

## 2018-04-05 NOTE — Progress Notes (Signed)
Orthopedic Tech Progress Note Patient Details:  [name] [name] 12-10-1975 [id number]  Patient ID: [name], female   DOB: 12-Feb-1976, 42 y.o.   MRN: [id number] Called order in to [facility].  Karolee Stamps 04/05/2018, 9:04 AM

## 2018-04-05 NOTE — Progress Notes (Signed)
Physical Therapy Treatment Patient Details Name: [name] [name] MRN: [id number] DOB: [birth year] Today's Date: 04/05/2018    History of Present Illness Pt is a 42 y.o. female admitted 04/03/18 with fever, tachycardia and severe L stump pain; worked up for L stump infection with borderline sepsis. MRI of LLE shows no evidence of steomyelitis; severe edema, tricompartmental OA of L knee. Per ortho, no indication for surgical intervention. PMH includes L transtibial amputation (03/2017; revision 05/2017), CKD2, OSA, uncontrolled DM2, peripheral neuropathy, fibromyalgia, bipolar disorder, anxiety.   PT Comments    Pt seen again for transfer back to bed. Was able to tolerate sitting in recliner ~75 minutes, but limited by c/o worsening L residual limb pain. Required maxA+2 for rolling and pad placement; use of maximove lift for transfer back to bed with assist from nursing staff. Pt tolerated this okay, but still limited by severe pain. Recommend use of maximove lift for future OOB transfers with nursing staff.   Follow Up Recommendations  CIR;Supervision for mobility/OOB     Equipment Recommendations  (TBD)    Recommendations for Other Services Rehab consult;OT consult     Precautions / Restrictions Precautions Precautions: Fall Restrictions Weight Bearing Restrictions: No    Mobility  Bed Mobility Overal bed mobility: Needs Assistance Bed Mobility: Rolling     Supine to sit: Min assist;HOB elevated     General bed mobility comments: MaxA+2 to roll R/L for lift pad placement in recliner; and again for pad removal while supine in bed. Pt very limited by L residual limb pain  Transfers Overall transfer level: Needs assistance Equipment used: None Transfers: Lateral/Scoot Transfers           General transfer comment: Performed transfer from recliner to bed with maximove; required extra assist to hold LLE due to discomfort lifted in sling. Pt tolerated  ok  Ambulation/Gait                 Stairs             Wheelchair Mobility    Modified Rankin (Stroke Patients Only)       Balance Overall balance assessment: Needs assistance   Sitting balance-Leahy Scale: Fair       Standing balance-Leahy Scale: Zero                              Cognition Arousal/Alertness: Awake/alert Behavior During Therapy: WFL for tasks assessed/performed Overall Cognitive Status: Within Functional Limits for tasks assessed                                 General Comments: Distracted by pain      Exercises      General Comments General comments (skin integrity, edema, etc.): RN and NT present to assist with lift back to bed      Pertinent Vitals/Pain Pain Assessment: Faces Faces Pain Scale: Hurts worst Pain Location: L residual limb Pain Descriptors / Indicators: Constant;Grimacing;Guarding;Moaning;Numbness;Shooting;Sharp Pain Intervention(s): Monitored during session;Limited activity within patient's tolerance;Patient requesting pain meds-RN notified    Home Living Family/patient expects to be discharged to:: Private residence Living Arrangements: Other relatives(nephew) Available Help at Discharge: Family;Available PRN/intermittently Type of Home: House Home Access: Stairs to enter Entrance Stairs-Rails: Right;Left Home Layout: One level Home Equipment: Environmental consultant - 2 wheels;Wheelchair - manual Additional Comments: Ramp was never built; pt navigates stairs into home.  Prior Function Level of Independence: Independent with assistive device(s)  Gait / Transfers Assistance Needed: Uses RW a good bit around the house without prosthetic; recently got prosthetic, but pt reports too big, so she only wears it in community. Mod indep with w/c mobility. Ascends/descends steps into home without assist. Able to get w/c into and out of car without assist ADL's / Homemaking Assistance Needed: Reports mod  indep with ADLs Comments: Mourning loss of mother who passed 04/2017 who was pt's main support system   PT Goals (current goals can now be found in the care plan section) Acute Rehab PT Goals Patient Stated Goal: "I can't go home like this... I need less pain so I can move" PT Goal Formulation: With patient Time For Goal Achievement: 04/19/18 Potential to Achieve Goals: Fair Progress towards PT goals: Not progressing toward goals - comment(Limited by pain requiring lift back to bed)    Frequency    Min 3X/week      PT Plan Current plan remains appropriate    Co-evaluation              AM-PAC PT "6 Clicks" Daily Activity  Outcome Measure  Difficulty turning over in bed (including adjusting bedclothes, sheets and blankets)?: Unable Difficulty moving from lying on back to sitting on the side of the bed? : Unable Difficulty sitting down on and standing up from a chair with arms (e.g., wheelchair, bedside commode, etc,.)?: Unable Help needed moving to and from a bed to chair (including a wheelchair)?: A Lot Help needed walking in hospital room?: Total Help needed climbing 3-5 steps with a railing? : Total 6 Click Score: 7    End of Session Equipment Utilized During Treatment: Gait belt Activity Tolerance: Patient limited by pain Patient left: in bed;with call bell/phone within reach;with nursing/sitter in room Nurse Communication: Mobility status;Need for lift equipment PT Visit Diagnosis: Other abnormalities of gait and mobility (R26.89);Muscle weakness (generalized) (M62.81);Pain Pain - Right/Left: Left Pain - part of body: Leg     Time: 1000-1021 PT Time Calculation (min) (ACUTE ONLY): 21 min  Charges:  $Therapeutic Activity: 8-22 mins                    Mabeline Caras, PT, DPT Acute Rehabilitation Services  Pager [phone] Office [place] 04/05/2018, 10:56 AM

## 2018-04-06 ENCOUNTER — Encounter (HOSPITAL_COMMUNITY)
Admission: EM | Disposition: A | Discharge status: Rehabilitation Facility | Payer: Self-pay | Source: Home / Self Care | Attending: Student in an Organized Health Care Education/Training Program

## 2018-04-06 ENCOUNTER — Encounter (HOSPITAL_COMMUNITY): Payer: Self-pay | Admitting: Physical Medicine and Rehabilitation

## 2018-04-06 LAB — CBC
HEMATOCRIT: 26.8 % — AB (ref 36.0–46.0)
Hemoglobin: 8.1 g/dL — ABNORMAL LOW (ref 12.0–15.0)
MCH: 24.2 pg — ABNORMAL LOW (ref 26.0–34.0)
MCHC: 30.2 g/dL (ref 30.0–36.0)
MCV: 80 fL (ref 80.0–100.0)
NRBC: 0.3 % — AB (ref 0.0–0.2)
PLATELETS: 262 10*3/uL (ref 150–400)
RBC: 3.35 MIL/uL — ABNORMAL LOW (ref 3.87–5.11)
RDW: 15.5 % (ref 11.5–15.5)
WBC: 9.7 10*3/uL (ref 4.0–10.5)

## 2018-04-06 LAB — BASIC METABOLIC PANEL
Anion gap: 10 (ref 5–15)
Anion gap: 10 (ref 5–15)
BUN: 38 mg/dL — ABNORMAL HIGH (ref 6–20)
BUN: 39 mg/dL — AB (ref 6–20)
CALCIUM: 8.5 mg/dL — AB (ref 8.9–10.3)
CALCIUM: 8.2 mg/dL — AB (ref 8.9–10.3)
CHLORIDE: 100 mmol/L (ref 98–111)
CO2: 18 mmol/L — ABNORMAL LOW (ref 22–32)
CO2: 16 mmol/L — ABNORMAL LOW (ref 22–32)
CREATININE: 2.65 mg/dL — AB (ref 0.44–1.00)
Chloride: 102 mmol/L (ref 98–111)
Creatinine, Ser: 2.3 mg/dL — ABNORMAL HIGH (ref 0.44–1.00)
GFR calc Af Amer: 29 mL/min — ABNORMAL LOW (ref >60)
GFR calc non Af Amer: 21 mL/min — ABNORMAL LOW (ref >60)
GFR, EST AFRICAN AMERICAN: 25 mL/min — AB (ref >60)
GFR, EST NON AFRICAN AMERICAN: 25 mL/min — AB (ref >60)
GLUCOSE: 187 mg/dL — AB (ref 70–99)
Glucose, Bld: 219 mg/dL — ABNORMAL HIGH (ref 70–99)
Potassium: 4.2 mmol/L (ref 3.5–5.1)
Potassium: 4.1 mmol/L (ref 3.5–5.1)
SODIUM: 128 mmol/L — AB (ref 135–145)
Sodium: 128 mmol/L — ABNORMAL LOW (ref 135–145)

## 2018-04-06 LAB — GLUCOSE, CAPILLARY
Glucose-Capillary: 206 mg/dL — ABNORMAL HIGH (ref 70–99)
Glucose-Capillary: 189 mg/dL — ABNORMAL HIGH (ref 70–99)
Glucose-Capillary: 159 mg/dL — ABNORMAL HIGH (ref 70–99)
Glucose-Capillary: 114 mg/dL — ABNORMAL HIGH (ref 70–99)

## 2018-04-06 LAB — CULTURE, BLOOD (ROUTINE X 2): Special Requests: ADEQUATE

## 2018-04-06 SURGERY — DISARTICULATION, HIP
Anesthesia: Choice | Laterality: Left

## 2018-04-06 MED ORDER — INSULIN ASPART 100 UNIT/ML ~~LOC~~ SOLN
0.0000 [IU] | Freq: Three times a day (TID) | SUBCUTANEOUS | Status: DC
  Administered 2018-04-06 – 2018-04-07 (×4): 4 [IU] via SUBCUTANEOUS
  Administered 2018-04-07: 3 [IU] via SUBCUTANEOUS
  Administered 2018-04-09: 4 [IU] via SUBCUTANEOUS

## 2018-04-06 MED ORDER — OXYCODONE-ACETAMINOPHEN 5-325 MG PO TABS
1.0000 | ORAL_TABLET | Freq: Four times a day (QID) | ORAL | Status: DC | PRN
  Administered 2018-04-06 (×3): 1 via ORAL
  Filled 2018-04-06 (×3): qty 1

## 2018-04-06 MED ORDER — HYDROMORPHONE HCL 1 MG/ML IJ SOLN
1.0000 mg | Freq: Four times a day (QID) | INTRAMUSCULAR | Status: DC | PRN
  Administered 2018-04-06 – 2018-04-08 (×7): 1 mg via INTRAVENOUS
  Filled 2018-04-06 (×6): qty 1

## 2018-04-06 MED ORDER — POLYETHYLENE GLYCOL 3350 17 G PO PACK
17.0000 g | PACK | Freq: Every day | ORAL | Status: DC
  Administered 2018-04-06 – 2018-04-10 (×5): 17 g via ORAL
  Filled 2018-04-06 (×5): qty 1

## 2018-04-06 NOTE — Evaluation (Signed)
Occupational Therapy Evaluation Patient Details Name: Rolley Sims MRN: [id number] DOB: [birth year] Today's Date: 04/06/2018    History of Present Illness Pt is a 42 y.o. female admitted 04/03/18 with fever, tachycardia and severe L stump pain; worked up for L stump infection with borderline sepsis. MRI of LLE shows no evidence of steomyelitis; severe edema, tricompartmental OA of L knee. Per ortho, no indication for surgical intervention. PMH includes L transtibial amputation (03/2017; revision 05/2017), CKD2, OSA, uncontrolled DM2, peripheral neuropathy, fibromyalgia, bipolar disorder, anxiety.   Clinical Impression   PATIENT IS HAVING SIGNIFICANT PAIN THAT IS LIMITING ABILITY WITH MOBILITY AND ADLS. PATIENT WANTS TO GO TO CIR AND CIR MD CAME TO SEE PATIENT AND STATED SHE WOULD BE APPROPRIATE BUT NEEDS TO HAVE DECREASED PAIN SO SHE CAN PARTICIPATE IN THERAPY. DISCUSSES WITH PATIENT NURSE WHO WILL TELL ACUTE CARE MD THAT PATIENT NEEDS BETTER PAIN CONTROL. PATIENT TO BE FOLLOWED BY ACUTE OT UNTIL PLACEMENT OCCURS.     Follow Up Recommendations  CIR    Equipment Recommendations       Recommendations for Other Services       Precautions / Restrictions Precautions Precautions: Fall Restrictions Weight Bearing Restrictions: No      Mobility Bed Mobility                  Transfers                      Balance                                           ADL either performed or assessed with clinical judgement   ADL Overall ADL's : Needs assistance/impaired Eating/Feeding: Independent   Grooming: Wash/dry hands;Wash/dry face;Supervision/safety;Set up;Sitting   Upper Body Bathing: Supervision/ safety;Set up;Sitting   Lower Body Bathing: Maximal assistance;Bed level   Upper Body Dressing : Set up;Minimal assistance;Sitting   Lower Body Dressing: Total assistance;Bed level                 General ADL Comments: PATIENT IS  REQUIRING EXTENSIVE ASSIST . PATIENT PERFOMRED ADLS BED LEVEL SECONDARY TO PAIN AND REQUIRING 2 PERSON ASSIT FOR BED MOBILITY AND WITH LIFT FOR TRANSFERS     Vision Baseline Vision/History: Wears glasses Wears Glasses: Reading only Patient Visual Report: No change from baseline(PATIENT REPORTS THAT SHE MIGHT HAVE DIABETIC RETINAPATHY )       Perception     Praxis      Pertinent Vitals/Pain Pain Assessment: 0-10 Pain Score: 9  Pain Location: L residual limb Pain Descriptors / Indicators: Constant;Grimacing;Guarding;Moaning;Numbness;Shooting;Sharp Pain Intervention(s): Limited activity within patient's tolerance;Monitored during session;Premedicated before session     Hand Dominance Right   Extremity/Trunk Assessment Upper Extremity Assessment Upper Extremity Assessment: Generalized weakness           Communication Communication Communication: No difficulties   Cognition Arousal/Alertness: Awake/alert Behavior During Therapy: WFL for tasks assessed/performed Overall Cognitive Status: Within Functional Limits for tasks assessed                                 General Comments: Distracted by pain   General Comments       Exercises     Shoulder Instructions      Home Living Family/patient expects to be discharged to:: Private residence Living  Arrangements: Other relatives Available Help at Discharge: Family;Available PRN/intermittently Type of Home: House Home Access: Stairs to enter [employer] of Steps: 2 Entrance Stairs-Rails: Right;Left Home Layout: One level     Bathroom Shower/Tub: Corporate investment banker: Standard Bathroom Accessibility: Yes   Home Equipment: Environmental consultant - 2 wheels;Wheelchair - manual   Additional Comments: Ramp was never built; pt navigates stairs into home.       Prior Functioning/Environment Level of Independence: Independent with assistive device(s)  Gait / Transfers Assistance Needed:  Uses RW a good bit around the house without prosthetic; recently got prosthetic, but pt reports too big, so she only wears it in community. Mod indep with w/c mobility. Ascends/descends steps into home without assist. Able to get w/c into and out of car without assist ADL's / Homemaking Assistance Needed: Reports mod indep with ADLs   Comments: Mourning loss of mother who passed 04/2017 who was pt's main support system        OT Problem List:        OT Treatment/Interventions: Self-care/ADL training;DME and/or AE instruction;Therapeutic activities;Patient/family education    OT Goals(Current goals can be found in the care plan section) Acute Rehab OT Goals Patient Stated Goal: PATIENT WANTS CIR OT Goal Formulation: With patient Time For Goal Achievement: 04/20/18 Potential to Achieve Goals: Good  OT Frequency: Min 2X/week   Barriers to D/C: Decreased caregiver support          Co-evaluation              AM-PAC PT "6 Clicks" Daily Activity     Outcome Measure Help from another person eating meals?: None Help from another person taking care of personal grooming?: A Little Help from another person toileting, which includes using toliet, bedpan, or urinal?: Total Help from another person bathing (including washing, rinsing, drying)?: A Lot Help from another person to put on and taking off regular upper body clothing?: A Little Help from another person to put on and taking off regular lower body clothing?: Total 6 Click Score: 14   End of Session Nurse Communication: (OK THERAPY. )  Activity Tolerance: Patient limited by pain Patient left: in bed;with call bell/phone within reach;with bed alarm set  OT Visit Diagnosis: Unsteadiness on feet (R26.81);Other abnormalities of gait and mobility (R26.89);Muscle weakness (generalized) (M62.81);Pain                Time: 1610-9604 OT Time Calculation (min): 34 min Charges:  OT General Charges $OT Visit: 1 Visit OT  Evaluation $OT Eval Low Complexity: 1 Low OT Treatments $Self Care/Home Management : 5-40 mins  6 CLICKS  Jamesmichael Shadd,[place] 04/06/2018, 2:36 PM

## 2018-04-06 NOTE — Progress Notes (Signed)
Per orth-tech, Tresa Moore, [facility] has stated pt's stump shrinker has to be custom made and will take 2 weeks before it is ready.  Called Dr. Sharol Given who advised to use ace wrap for compression in the meantime.

## 2018-04-06 NOTE — Progress Notes (Signed)
Internal Medicine Attending:   I saw and examined the patient. I reviewed the resident's note and I agree with the resident's findings and plan as documented in the resident's note.  Principal Problem:   Sepsis ([place]) Active Problems:   Uncontrolled type 2 diabetes mellitus (HCC)   OSA (obstructive sleep apnea)   Chronic kidney disease (CKD), stage III (moderate) (HCC)   Status post below knee amputation, left (HCC)   BKA stump complication (HCC)   Severe protein-calorie malnutrition ([place])   Bacteremia due to group B Streptococcus  Hospital day #70 for a 42 year old woman living with uncontrolled diabetes and morbid obesity admitted with sepsis due to group B strep bacteremia.  The source of this bacteremia is a cellulitis of her left below-knee amputation stump.  Her clinical condition has improved steadily over the last few days.  Her fever curve has normalized, continues to have no leukocytosis, tachycardia improving.  We are treating her with ceftriaxone for a planned 14 days.  Her infection was complicated by acute on chronic renal failure with a creatinine peak at 2.7.  This is probably a combination of dehydration and ATN in the setting of sepsis.  Her volume status looks appropriate today, may be even a little edematous in her upper extremities, so will stop with IV fluids for now and monitor renal function.  Once her blood cultures are confirmed to have cleared and her renal function improves, a PICC can be placed and arrangements made for continued antibiotics at a skilled nursing facility.  The long-term we need to continue to work on her overall diabetes control, and A1c is 15, her nutrition given her very low albumin, depressed mood and social support.  Dr. Beryle Beams will take over as attending physician starting tomorrow.  Lalla Brothers, MD

## 2018-04-06 NOTE — Progress Notes (Signed)
   Subjective:  Feeling better today, pain is improved from yesterday but still present. No SOB and off [place]. She is still having some nausea and decreased appetite but no vomiting o/n. Discussed it's important to eat but she doesn't need to force herself.   Objective:  Vital signs in last 24 hours: Vitals:   04/06/18 0425 04/06/18 0700 04/06/18 0900 04/06/18 0911  BP: 117/77   118/72  Pulse: 98 99 100 100  Resp: 19 17 20 16   Temp: 98.5 F (36.9 C)     TempSrc: Oral     SpO2: 97% 100% 98% 98%  Weight: (!) 158.5 kg     Height:       PE Constitution: NAD, lying supine in bed Cardio: RRR Respiratory: non-labored breathing Neuro: a&o, cooperative, normal affect Skin: +1 pitting edema LE; mild edema UE; stump site without erythema, open wound or drainage   Assessment/Plan:  Principal Problem:   Sepsis ([place]) Active Problems:   Uncontrolled type 2 diabetes mellitus (HCC)   OSA (obstructive sleep apnea)   Chronic kidney disease (CKD), stage III (moderate) (HCC)   Status post below knee amputation, left (HCC)   BKA stump complication (HCC)   Severe protein-calorie malnutrition ([place])   Bacteremia due to group B Streptococcus   Group B Strep Bacteremia Sepsis resolved. Volume status improved and appears hydrated. No fevers overnight. We will have PICC line placed once cultures return. She continues to have pain in her BKA although this is improved. It appears she has been taking opioid pain medications for her stump pain for some time.   - cont. Ceftriaxone day 2/14 - f/u blood cultures  - dilaudid 1mg  switch from q4h to q6h; percocet 5-325 mg q6h prn - will wean off dilaudid over next few days  - am CBC, BMP, Mg - cont. PT/OT - hold fluids  - zofran prn nausea  AKI on CKD  GFR slightly improved after fluid resuscitation. Na improving as well. She is taking po fluids.   - hold IVF - afternoon BMP   TIIDM Improving.   - increase SSI to resistant  - cont. 5U TID ac -  SSI at bedtime - lantus 20U bid   MDD reconsult chaplain as patient is more alert - cont. prozac 20mg  qd   VTE: heparin IVF: none Diet: heart healthy, carb modified Code: full   Dispo: Anticipated discharge pending blood cultures and PICC placement.    Molli Hazard A, DO 04/06/2018, 10:27 AM Pager: [phone]

## 2018-04-06 NOTE — Care Management Important Message (Signed)
Important Message  Patient Details  Name: [name] [name] MRN: [id number] Date of Birth: 1975-07-08   Medicare Important Message Given:  Yes    [name] [address]ine [address] 04/06/2018, 3:53 PM

## 2018-04-06 NOTE — Consult Note (Signed)
Physical Medicine and Rehabilitation Consult   Reason for Consult: Debility.  Referring Physician: Dr. Evette Doffing   HPI: [name] [name] is a 42 y.o. female with history of T2DM, OSA, CKD, bipolar disorder, anxiety disorder, morbid obesity--BMI 50, ongoing tobacco use, left BKA (CIR 03/2017) who has had falls with increase in edema and pain left stump. She who was admitted on 04/03/2018 with fever due to sepsis from  group B bacteremia.  She was started on broad-spectrum IV antibiotics for treatment as well as IV Dilaudid for pain control MRI of LLE showed generalized soft tissue edema in distal thigh, severe muscle edema in residual gastrocnemius question infectious or due to inflammatory myositis. Dr. Sharol Given felt that there was no need for surgical intervention and antibiotics narrowed to ceftriaxone with recommendations to complete 2 weeks course.  She has had issues with nausea with poor appetite as well as pain with deficits in mobility.  Therapy evaluations done yesterday and CIR was recommended for follow-up therapy   Review of Systems  Constitutional: Negative for chills and fever.  HENT: Negative for hearing loss and tinnitus.   Eyes: Negative for blurred vision and double vision.  Respiratory: Positive for shortness of breath.   Cardiovascular: Positive for leg swelling. Negative for chest pain and palpitations.  Gastrointestinal: Positive for constipation (no bm for few days), nausea and vomiting.  Genitourinary: Negative for dysuria and urgency.  Musculoskeletal: Positive for back pain, falls (multiple. ) and myalgias.  Skin: Negative for rash.  Neurological: Positive for focal weakness. Negative for dizziness and headaches.  Psychiatric/Behavioral: Positive for depression. The patient is nervous/anxious. The patient does not have insomnia.      Past Medical History:  Diagnosis Date  . Abdominal muscle pain 09/08/2016  . Abnormal Pap smear of cervix 2009  .  Abscess    history of multiple abscesses  . Acute bilateral low back pain 02/13/2017  . Acute blood loss anemia   . Anemia of chronic disease 2002  . Anxiety    Panic attacks  . Bilateral lower extremity edema 05/13/2016  . Bipolar disorder ([place])   . Cellulitis 05/21/2014   right eye  . Chronic bronchitis ([place])    "get it q yr" (05/13/2013)  . Chronic pain   . Depression   . Edema of lower extremity   . Endocarditis 2002   subacute bacterial endocarditis.   . Family history of anesthesia complication    "my mom has a hard time coming out from under"  . Fibromyalgia   . GERD (gastroesophageal reflux disease)    occ  . Heart murmur   . History of blood transfusion    "just low blood count" (05/13/2013)  . Hyperlipidemia   . Hypertension   . Hypothyroidism   . Hypothyroidism, adult 03/21/2014  . Leukocytosis   . Necrosis ([place])    and ulceration  . Obesity   . OSA on CPAP    does not wear CPAP  . Peripheral neuropathy   . Type II diabetes mellitus ([place])    Type  II    Past Surgical History:  Procedure Laterality Date  . AMPUTATION Left 04/07/2017   Procedure: LEFT BELOW KNEE AMPUTATION;  Surgeon: Newt Minion, MD;  Location: [facility];  Service: Orthopedics;  Laterality: Left;  . EYE SURGERY     [place]  . INCISION AND DRAINAGE ABSCESS     multiple I&Ds  . INCISION AND DRAINAGE ABSCESS Left 07/09/2012   Procedure: DRESSING  CHANGE, THIGH WOUND;  Surgeon: Harl Bowie, MD;  Location: [facility];  Service: General;  Laterality: Left;  . INCISION AND DRAINAGE OF WOUND Left 07/07/2012   Procedure: IRRIGATION AND DEBRIDEMENT WOUND;  Surgeon: Harl Bowie, MD;  Location: [facility];  Service: General;  Laterality: Left;  . INCISION AND DRAINAGE PERIRECTAL ABSCESS Left 07/14/2012   Procedure: DEBRIDEMENT OF SKIN & SOFT TISSUE; DRESSING CHANGE UNDER ANESTHESIA;  Surgeon: Gayland Curry, MD,FACS;  Location: [facility];  Service: General;  Laterality: Left;  . INCISION AND DRAINAGE  PERIRECTAL ABSCESS Left 07/16/2012   Procedure: I&D Left Thigh;  Surgeon: Gwenyth Ober, MD;  Location: [facility];  Service: General;  Laterality: Left;  . INCISION AND DRAINAGE PERIRECTAL ABSCESS N/A 01/05/2015   Procedure: IRRIGATION AND DEBRIDEMENT PERIRECTAL ABSCESS;  Surgeon: Donnie Mesa, MD;  Location: [facility];  Service: General;  Laterality: N/A;  . IRRIGATION AND DEBRIDEMENT ABSCESS Left 07/06/2012   Procedure: IRRIGATION AND DEBRIDEMENT ABSCESS BUTTOCKS AND THIGH;  Surgeon: Shann Medal, MD;  Location: [facility];  Service: General;  Laterality: Left;  . IRRIGATION AND DEBRIDEMENT ABSCESS Left 08/10/2012   Procedure: IRRIGATION AND DEBRIDEMENT ABSCESS;  Surgeon: Madilyn Hook, DO;  Location: [facility];  Service: General;  Laterality: Left;  . STUMP REVISION Left 06/09/2017   Procedure: REVISION LEFT BELOW KNEE AMPUTATION;  Surgeon: Newt Minion, MD;  Location: [facility];  Service: Orthopedics;  Laterality: Left;  . TONSILLECTOMY  1994    Family History  Problem Relation Age of Onset  . Heart failure Mother   . Diabetes Mother   . Kidney disease Mother   . Kidney disease Father   . Diabetes Father   . Diabetes Paternal Grandmother   . Heart failure Paternal Grandmother   . Other Other     Social History:  Nephew lives with her. Wheelchair bound and has had to crawl over one step to get in the house. She is able to ambulate household distances with RW? Has an aide 2 hrs/7 days for housework . She reports that she has been smoking cigarettes--cut down to 1 PPD.  She has never used smokeless tobacco. She reports that she drinks alcohol. She reports that she does not use drugs.    Allergies  Allergen Reactions  . Vancomycin Other (See Comments)    Acute renal failure suspected secondary to vanco    Medications Prior to Admission  Medication Sig Dispense Refill  . amitriptyline (ELAVIL) 50 MG tablet TAKE 1 TABLET BY MOUTH AT BEDTIME (Patient taking differently: Take 50 mg by mouth at bedtime. )  90 tablet 3  . atorvastatin (LIPITOR) 20 MG tablet TAKE 1 TABLET BY MOUTH EVERY DAY (Patient taking differently: Take 20 mg by mouth daily. ) 90 tablet 1  . bumetanide (BUMEX) 1 MG tablet Take 1 tablet (1 mg total) by mouth 2 (two) times daily. 180 tablet 3  . cyclobenzaprine (FLEXERIL) 10 MG tablet TAKE 1 TABLET BY MOUTH EVERY TWELVE HOURS AS NEEDED for MUSCLE SPASMS (Patient taking differently: Take 10 mg by mouth 3 (three) times daily as needed for muscle spasms. ) 30 tablet 0  . diclofenac sodium (VOLTAREN) 1 % GEL Apply 4 grams to affected area 4 times daily as needed for pain (Patient taking differently: Apply 4 g topically 4 (four) times daily as needed (pain). ) 100 g 2  . docusate sodium (COLACE) 100 MG capsule Take 1 capsule (100 mg total) by mouth 2 (two) times daily. (Patient taking  differently: Take 100 mg by mouth 2 (two) times daily as needed for moderate constipation. ) 10 capsule 0  . FLUoxetine (PROZAC) 20 MG/5ML solution Take 15 mLs (60 mg total) by mouth daily. 480 mL 1  . ibuprofen (ADVIL) 200 MG tablet Take 3 tablets (600 mg total) by mouth every 8 (eight) hours as needed for moderate pain (Take with food and water). 100 tablet 2  . insulin aspart (NOVOLOG FLEXPEN) 100 UNIT/ML FlexPen Inject 13 Units into the skin 3 (three) times daily with meals. (Patient taking differently: Inject 15 Units into the skin 3 (three) times daily with meals. ) 15 mL 1  . insulin glargine (LANTUS) 100 unit/mL SOPN Inject 0.5 mLs (50 Units total) into the skin 2 (two) times daily. (Patient taking differently: Inject 40 Units into the skin 2 (two) times daily. ) 15 mL 11  . LYRICA 100 MG capsule TAKE 1 CAPSULE BY MOUTH 3 TIMES DAILY (Patient taking differently: Take 100 mg by mouth 3 (three) times daily. ) 90 capsule 2  . mupirocin ointment (BACTROBAN) 2 % Apply 1 application topically 2 (two) times daily. (Patient taking differently: Apply 1 application topically 2 (two) times daily as needed (skin care).  ) 22 g 6  . nystatin (MYCOSTATIN/NYSTOP) powder Apply topically 4 (four) times daily. After cleaning and drying the affected area for two weeks (Patient taking differently: Apply 1 g topically 4 (four) times daily as needed (skin care). After cleaning and drying the affected area for two weeks) 30 g 1  . oxyCODONE-acetaminophen (PERCOCET/ROXICET) 5-325 MG tablet Take 1 tablet by mouth every 6 (six) hours as needed for severe pain. 30 tablet 0  . polyethylene glycol (MIRALAX / GLYCOLAX) packet Take 17 g by mouth daily. (Patient taking differently: Take 17 g by mouth daily as needed for moderate constipation. ) 14 each 0  . ULTICARE INSULIN SYRINGE 31G X 1/4" 0.5 ML MISC Use to inject insulin as directed 100 each 2  . VICTOZA 18 MG/3ML SOPN Inject 0.3 mLs (1.8 mg total) into the skin daily. 9 mL 2  . fluconazole (DIFLUCAN) 150 MG tablet Please take one tablet every three days (72 hours apart) until completed (Patient not taking: Reported on 04/03/2018) 3 tablet 0    Home: Home Living Family/patient expects to be discharged to:: Private residence Living Arrangements: Other relatives(nephew) Available Help at Discharge: Family, Available PRN/intermittently Type of Home: House Home Access: Stairs to enter [employer] of Steps: 2 Entrance Stairs-Rails: Right, Left Home Layout: One level Bathroom Shower/Tub: Tub/shower unit, Architectural technologist: Standard Home Equipment: Environmental consultant - 2 wheels, Wheelchair - manual Additional Comments: Ramp was never built; pt navigates stairs into home.   Functional History: Prior Function Level of Independence: Independent with assistive device(s) Gait / Transfers Assistance Needed: Uses RW a good bit around the house without prosthetic; recently got prosthetic, but pt reports too big, so she only wears it in community. Mod indep with w/c mobility. Ascends/descends steps into home without assist. Able to get w/c into and out of car without assist ADL's  / Homemaking Assistance Needed: Reports mod indep with ADLs Comments: Mourning loss of mother who passed 04/2017 who was pt's main support system Functional Status:  Mobility: Bed Mobility Overal bed mobility: Needs Assistance Bed Mobility: Rolling Supine to sit: Min assist, HOB elevated General bed mobility comments: MaxA+2 to roll R/L for lift pad placement in recliner; and again for pad removal while supine in bed. Pt very limited by L residual  limb pain Transfers Overall transfer level: Needs assistance Equipment used: None Transfer via Lift Equipment: Maximove Transfers: Lateral/Scoot Transfers General transfer comment: Performed transfer from recliner to bed with maximove; required extra assist to hold LLE due to discomfort lifted in sling. Pt tolerated ok      ADL:    Cognition: Cognition Overall Cognitive Status: Within Functional Limits for tasks assessed Orientation Level: Oriented X4 Cognition Arousal/Alertness: Awake/alert Behavior During Therapy: WFL for tasks assessed/performed Overall Cognitive Status: Within Functional Limits for tasks assessed General Comments: Distracted by pain   Blood pressure 118/72, pulse 100, temperature 98.5 F (36.9 C), temperature source Oral, resp. rate 16, height 5\' 10"  (1.778 m), weight (!) 158.5 kg, last menstrual period 02/19/2018, SpO2 98 %. Physical Exam  Nursing note and vitals reviewed. Constitutional: She is oriented to person, place, and time. She appears well-developed and well-nourished.  Morbidly obese  Eyes: Pupils are equal, round, and reactive to light. EOM are normal.  Neck: Normal range of motion.  Cardiovascular: Normal rate.  Respiratory: Effort normal.  GI: Soft.  Musculoskeletal: She exhibits edema.  L-BKA with compressive wrap and moderate edema left thigh. EXTREMELY TENDER to touch and simple manipulation. Right foot with 1+ edema and callused ulcer on plantar surface of great toe. Small healed lesions  RLE. Sensory deficits RLE.   Neurological: She is alert and oriented to person, place, and time.  UE 5/5 (very strong). RLE 3+ to 4+/5 prox to distal. LLE: unable to move due to pain/swelling  Psychiatric: She has a normal mood and affect. Her behavior is normal.    Results for orders placed or performed during the hospital encounter of 04/03/18 (from the past 24 hour(s))  Basic metabolic panel     Status: Abnormal   Collection Time: 04/05/18 12:34 PM  Result Value Ref Range   Sodium 125 (L) 135 - 145 mmol/L   Potassium 4.1 3.5 - 5.1 mmol/L   Chloride 100 98 - 111 mmol/L   CO2 17 (L) 22 - 32 mmol/L   Glucose, Bld 254 (H) 70 - 99 mg/dL   BUN 38 (H) 6 - 20 mg/dL   Creatinine, Ser 2.70 (H) 0.44 - 1.00 mg/dL   Calcium 8.3 (L) 8.9 - 10.3 mg/dL   GFR calc non Af Amer 21 (L) >60 mL/min   GFR calc Af Amer 24 (L) >60 mL/min   Anion gap 8 5 - 15  Glucose, capillary     Status: Abnormal   Collection Time: 04/05/18  3:59 PM  Result Value Ref Range   Glucose-Capillary 241 (H) 70 - 99 mg/dL   Comment 1 Notify RN   Glucose, capillary     Status: Abnormal   Collection Time: 04/05/18 10:09 PM  Result Value Ref Range   Glucose-Capillary 209 (H) 70 - 99 mg/dL   Comment 1 Notify RN    Comment 2 Document in Chart   Basic metabolic panel     Status: Abnormal   Collection Time: 04/06/18  3:48 AM  Result Value Ref Range   Sodium 128 (L) 135 - 145 mmol/L   Potassium 4.2 3.5 - 5.1 mmol/L   Chloride 100 98 - 111 mmol/L   CO2 18 (L) 22 - 32 mmol/L   Glucose, Bld 219 (H) 70 - 99 mg/dL   BUN 38 (H) 6 - 20 mg/dL   Creatinine, Ser 2.65 (H) 0.44 - 1.00 mg/dL   Calcium 8.5 (L) 8.9 - 10.3 mg/dL   GFR calc non Af Amer 21 (  L) >60 mL/min   GFR calc Af Amer 25 (L) >60 mL/min   Anion gap 10 5 - 15  CBC     Status: Abnormal   Collection Time: 04/06/18  3:48 AM  Result Value Ref Range   WBC 9.7 4.0 - 10.5 K/uL   RBC 3.35 (L) 3.87 - 5.11 MIL/uL   Hemoglobin 8.1 (L) 12.0 - 15.0 g/dL   HCT 26.8 (L) 36.0 -  46.0 %   MCV 80.0 80.0 - 100.0 fL   MCH 24.2 (L) 26.0 - 34.0 pg   MCHC 30.2 30.0 - 36.0 g/dL   RDW 15.5 11.5 - 15.5 %   Platelets 262 150 - 400 K/uL   nRBC 0.3 (H) 0.0 - 0.2 %  Glucose, capillary     Status: Abnormal   Collection Time: 04/06/18  6:22 AM  Result Value Ref Range   Glucose-Capillary 206 (H) 70 - 99 mg/dL   Comment 1 Notify RN    Comment 2 Document in Chart   Glucose, capillary     Status: Abnormal   Collection Time: 04/06/18 11:07 AM  Result Value Ref Range   Glucose-Capillary 189 (H) 70 - 99 mg/dL   Comment 1 Notify RN    Comment 2 Document in Chart    Mr Tibia Fibula Left Wo Contrast  Result Date: 04/04/2018 CLINICAL DATA:  Increasing pain and swelling in the left lobe the knee amputation site. EXAM: MRI OF LOWER LEFT EXTREMITY WITHOUT CONTRAST TECHNIQUE: Multiplanar, multisequence MR imaging of the left lower leg was performed. No intravenous contrast was administered. COMPARISON:  None. FINDINGS: Bones/Joint/Cartilage Below the knee amputation at the level of the proximal tibial diaphysis. Small amount of fluid at the tip of the tibial stump. No marrow edema or cortical destruction. Tricompartmental cartilage loss of the left knee. Normal alignment. Large joint effusion. Ligaments Collateral ligaments are intact.  ACL and PCL are intact. Muscles and Tendons Mild atrophy and severe muscle edema in the residual gastrocnemius musculature which is likely neurogenic versus secondary to infectious or inflammatory myositis. Quadriceps tendon and patellar tendon are intact. Soft tissue Severe soft tissue edema in the subcutaneous fat throughout the distal thigh and left lower leg. No fluid collection or hematoma. No soft tissue mass. IMPRESSION: 1. Below the knee amputation of the left tibia and fibula. No evidence of osteomyelitis of the left tibia or fibula. Small amount of fluid at the tip of the tibial stump which may reflect an adventitial bursa. 2. Mild atrophy and severe  muscle edema in the residual gastrocnemius musculature which is likely neurogenic versus less likely secondary to infectious or inflammatory myositis. 3. Generalized soft tissue edema in the distal thigh and lower leg which may be secondary to venous insufficiency versus fluid overload. 4. Tricompartmental osteoarthritis of the left knee. Electronically Signed   By: Kathreen Devoid   On: 04/04/2018 20:25     Assessment/Plan: Diagnosis: Left lower ext myositis/sepsis, hx of left BKA 1. Does the need for close, 24 hr/day medical supervision in concert with the patient's rehab needs make it unreasonable for this patient to be served in a less intensive setting? Yes and Potentially 2. Co-Morbidities requiring supervision/potential complications: pain, wound care, ID mgt, morbid obesity 3. Due to bladder management, bowel management, safety, skin/wound care, disease management, medication administration, pain management and patient education, does the patient require 24 hr/day rehab nursing? Yes 4. Does the patient require coordinated care of a physician, rehab nurse, PT (1-2 hrs/day, 5 days/week) and  OT (1-2 hrs/day, 5 days/week) to address physical and functional deficits in the context of the above medical diagnosis(es)? Yes and Potentially Addressing deficits in the following areas: balance, endurance, locomotion, strength, transferring, bowel/bladder control, bathing, dressing, feeding, grooming, toileting and psychosocial support 5. Can the patient actively participate in an intensive therapy program of at least 3 hrs of therapy per day at least 5 days per week? Potentially 6. The potential for patient to make measurable gains while on inpatient rehab is good and fair 7. Anticipated functional outcomes upon discharge from inpatient rehab are modified independent  with PT, modified independent and supervision with OT, n/a with SLP. 8. Estimated rehab length of stay to reach the above functional goals is:  potentially 14-18 days 9. Anticipated D/C setting: Home 10. Anticipated post D/C treatments: HH therapy and Outpatient therapy 11. Overall Rehab/Functional Prognosis: excellent  RECOMMENDATIONS: This patient's condition is appropriate for continued rehabilitative care in the following setting: CIR, eventually Patient has agreed to participate in recommended program. Yes Note that insurance prior authorization may be required for reimbursement for recommended care.  Comment: Patient is hardly able to tolerate bed mobility due to pain. Will follow for increased activity tolerance and improved pain control. Rehab Admissions Coordinator to follow up.  Thanks,  Meredith Staggers, MD, Mellody Drown  I have personally performed a face to face diagnostic evaluation of this patient. Additionally, I have reviewed and concur with the physician assistant's documentation above.    Bary Leriche, PA-C 04/06/2018

## 2018-04-07 DIAGNOSIS — E669 Obesity, unspecified: Secondary | ICD-10-CM

## 2018-04-07 DIAGNOSIS — E118 Type 2 diabetes mellitus with unspecified complications: Secondary | ICD-10-CM

## 2018-04-07 DIAGNOSIS — L03116 Cellulitis of left lower limb: Secondary | ICD-10-CM

## 2018-04-07 DIAGNOSIS — R110 Nausea: Secondary | ICD-10-CM

## 2018-04-07 DIAGNOSIS — N170 Acute kidney failure with tubular necrosis: Secondary | ICD-10-CM

## 2018-04-07 LAB — BASIC METABOLIC PANEL
ANION GAP: 9 (ref 5–15)
BUN: 42 mg/dL — AB (ref 6–20)
CO2: 18 mmol/L — ABNORMAL LOW (ref 22–32)
Calcium: 8.2 mg/dL — ABNORMAL LOW (ref 8.9–10.3)
Chloride: 102 mmol/L (ref 98–111)
Creatinine, Ser: 2.11 mg/dL — ABNORMAL HIGH (ref 0.44–1.00)
GFR, EST AFRICAN AMERICAN: 32 mL/min — AB (ref >60)
GFR, EST NON AFRICAN AMERICAN: 28 mL/min — AB (ref >60)
Glucose, Bld: 129 mg/dL — ABNORMAL HIGH (ref 70–99)
POTASSIUM: 4 mmol/L (ref 3.5–5.1)
Sodium: 129 mmol/L — ABNORMAL LOW (ref 135–145)

## 2018-04-07 LAB — CBC
HCT: 25.6 % — ABNORMAL LOW (ref 36.0–46.0)
HEMOGLOBIN: 7.5 g/dL — AB (ref 12.0–15.0)
MCH: 23.7 pg — ABNORMAL LOW (ref 26.0–34.0)
MCHC: 29.3 g/dL — AB (ref 30.0–36.0)
MCV: 81 fL (ref 80.0–100.0)
PLATELETS: 305 10*3/uL (ref 150–400)
RBC: 3.16 MIL/uL — ABNORMAL LOW (ref 3.87–5.11)
RDW: 15.5 % (ref 11.5–15.5)
WBC: 9.7 10*3/uL (ref 4.0–10.5)
nRBC: 0 % (ref 0.0–0.2)

## 2018-04-07 LAB — GLUCOSE, CAPILLARY
GLUCOSE-CAPILLARY: 125 mg/dL — AB (ref 70–99)
GLUCOSE-CAPILLARY: 184 mg/dL — AB (ref 70–99)
Glucose-Capillary: 170 mg/dL — ABNORMAL HIGH (ref 70–99)
Glucose-Capillary: 175 mg/dL — ABNORMAL HIGH (ref 70–99)

## 2018-04-07 LAB — MAGNESIUM: MAGNESIUM: 2.3 mg/dL (ref 1.7–2.4)

## 2018-04-07 MED ORDER — METOCLOPRAMIDE HCL 5 MG/ML IJ SOLN
5.0000 mg | Freq: Three times a day (TID) | INTRAMUSCULAR | Status: DC
  Administered 2018-04-07 – 2018-04-08 (×3): 5 mg via INTRAVENOUS
  Filled 2018-04-07 (×3): qty 2

## 2018-04-07 MED ORDER — ENOXAPARIN SODIUM 80 MG/0.8ML ~~LOC~~ SOLN
80.0000 mg | SUBCUTANEOUS | Status: DC
  Administered 2018-04-07 – 2018-04-10 (×4): 80 mg via SUBCUTANEOUS
  Filled 2018-04-07 (×4): qty 0.8

## 2018-04-07 MED ORDER — RAMELTEON 8 MG PO TABS
8.0000 mg | ORAL_TABLET | Freq: Once | ORAL | Status: AC
  Administered 2018-04-07: 8 mg via ORAL
  Filled 2018-04-07: qty 1

## 2018-04-07 MED ORDER — OXYCODONE-ACETAMINOPHEN 5-325 MG PO TABS
2.0000 | ORAL_TABLET | Freq: Four times a day (QID) | ORAL | Status: DC | PRN
  Administered 2018-04-07 – 2018-04-09 (×8): 2 via ORAL
  Filled 2018-04-07 (×9): qty 2

## 2018-04-07 MED ORDER — SENNOSIDES-DOCUSATE SODIUM 8.6-50 MG PO TABS
2.0000 | ORAL_TABLET | Freq: Two times a day (BID) | ORAL | Status: DC
  Administered 2018-04-07 – 2018-04-10 (×6): 2 via ORAL
  Filled 2018-04-07 (×6): qty 2

## 2018-04-07 NOTE — Progress Notes (Signed)
ANTICOAGULATION CONSULT NOTE - Initial Consult  Pharmacy Consult for Enoxaparin Indication: VTE prophylaxis  Allergies  Allergen Reactions  . Vancomycin Other (See Comments)    Acute renal failure suspected secondary to vanco    Patient Measurements: Height: 5\' 10"  (177.8 cm) Weight: (!) 349 lb 6.9 oz (158.5 kg) IBW/kg (Calculated) : 68.5  Vital Signs: Temp: 98 F (36.7 C) (11/16 0945) Temp Source: Oral (11/16 0945) BP: 138/88 (11/16 0945) Pulse Rate: 90 (11/16 0945)  Labs: Recent Labs    04/05/18 0444  04/06/18 0348 04/06/18 1255 04/07/18 0253  HGB 7.3*  --  8.1*  --  7.5*  HCT 24.4*  --  26.8*  --  25.6*  PLT 213  --  262  --  305  CREATININE 2.72*   < > 2.65* 2.30* 2.11*  CKTOTAL 659*  --   --   --   --    < > = values in this interval not displayed.    Estimated Creatinine Clearance: 57.9 mL/min (A) (by C-G formula based on SCr of 2.11 mg/dL (H)).   Medical History: Past Medical History:  Diagnosis Date  . Abdominal muscle pain 09/08/2016  . Abnormal Pap smear of cervix 2009  . Abscess    history of multiple abscesses  . Acute bilateral low back pain 02/13/2017  . Acute blood loss anemia   . Anemia of chronic disease 2002  . Anxiety    Panic attacks  . Bilateral lower extremity edema 05/13/2016  . Bipolar disorder ([place])   . Cellulitis 05/21/2014   right eye  . Chronic bronchitis ([place])    "get it q yr" (05/13/2013)  . Chronic pain   . Depression   . Edema of lower extremity   . Endocarditis 2002   subacute bacterial endocarditis.   . Family history of anesthesia complication    "my mom has a hard time coming out from under"  . Fibromyalgia   . GERD (gastroesophageal reflux disease)    occ  . Heart murmur   . History of blood transfusion    "just low blood count" (05/13/2013)  . Hyperlipidemia   . Hypertension   . Hypothyroidism   . Hypothyroidism, adult 03/21/2014  . Leukocytosis   . Necrosis ([place])    and ulceration  . Obesity   . OSA  on CPAP    does not wear CPAP  . Peripheral neuropathy   . Type II diabetes mellitus ([facility])    Type  II    Assessment: 42 yo female with sepsis due to group B strep bacteremia. Pharmacy consulted for VTE prophylaxis with enoxaparin.  Patient is morbidly obese (158 kg) with normal renal function (CrCl 58 ml/min). Recommend 0.5 mg/kg enoxaparin subq daily.   Plan:  Lovenox 80 mg subq daily. Will monitor H&H and platelets.  Alanda Slim, PharmD, [facility] Clinical Pharmacist Please see AMION for all Pharmacists' Contact Phone Numbers 04/07/2018, 10:57 AM

## 2018-04-07 NOTE — Progress Notes (Signed)
Medicine attending: I examined this patient today and I attest to the accuracy of the evaluation and management plan as recorded by resident physician Dr. Isac Sarna.  42 year old type II diabetic, now insulin-dependent, woman under treatment for group B streptococcal sepsis related to cellulitis that developed on the stump of a  left below the knee amputation done in January of this year.  She is uncomfortable.  Obese.  Lying in bed.  Primary infection is coming under control.  Initial acute on chronic renal insufficiency with baseline creatinine 1.1 back in July.  1.6 on admission November 12.  Peak at 2.7 on November 14.  Now trending down and was 2.1 today. If survey cultures negative at 72 hours, place pick catheter and transition to a rehab unit to continue planned 14-day course of antibiotics. White count borderline elevated but stable at 9700. Thromboprophylaxis.  I am changing her unfractionated heparin to weight adjusted low molecular weight heparin.

## 2018-04-07 NOTE — Progress Notes (Signed)
PT refused CPAP for the night. RT will continue to monitor as needed.

## 2018-04-07 NOTE — Progress Notes (Signed)
   Subjective:  No acute events overnight. [name] [name] remains stable. She complains of LLE pain that is worse with movement. This has limited her ability to participate in PT. She also complains of nausea that has led to decreased PO intake.   Objective:  Vital signs in last 24 hours: Vitals:   04/06/18 2005 04/06/18 2324 04/07/18 0012 04/07/18 0417  BP: 128/81  106/66 121/76  Pulse: 95 96 91 92  Resp: 17 (!) 22 19 13   Temp: 98.6 F (37 C)   98.3 F (36.8 C)  TempSrc: Oral   Oral  SpO2: 99% 100% 100% 98%  Weight:      Height:       General: obese, young female, well-developed, in pain but in no acute distress Mouth: MMM, no OP erythema or exudates  CV: RRR Ext: s/p L BKA, LLE remains warmth but without erythema. RLE with 1-2+ pitting edema   Assessment/Plan:  Principal Problem:   Sepsis ([place]) Active Problems:   Uncontrolled type 2 diabetes mellitus (HCC)   OSA (obstructive sleep apnea)   Chronic kidney disease (CKD), stage III (moderate) (HCC)   Status post below knee amputation, left (HCC)   BKA stump complication (HCC)   Severe protein-calorie malnutrition ([place])   Bacteremia due to group B Streptococcus  # GBS bacteremia 2/2 LLE cellulitis: [name] [name] remains afebrile and hemodynamically stable. She is on Ceftriaxone, which she will be on for 2 weeks for treatment of uncomplicated bacteremia (until November 28 if recent Bcx are negative). We repeated blood cultures on 11/14 which have been negative x 48 hours. If they remain negative tomorrow, will order PICC line placement. She continues to complain of LLE pain that has limit her ability to participate in physical therapy. She is a candidate for CIR if we are able to better manage her pain.  We will adjust her pain regimen as below with end goal to transition to orals.  - Continue Ceftriaxone  - Follow up Bcx tomorrow. Will order PICC line if they remain negative.  - Percocet 5-325 q6h PRN--> 10 -325 q6h PRN + continue  IV Dilaudid 1 mg q6h PRN  - Miralax + Senokot for bowel regimen  - Discontinue telemetry as she is no longer septic   # Non-oliguric AKI on CKD: This is secondary to hypovolemia and ATN. She has been appropriately volume resuscitated and  IVF fluids were stopped yesterday. Her renal function has been improving for the past 2 days. We will continue to monitor daily.   # Uncontrolled T2DM: A1c 15. She is currently on Lantus 20U BID, Novolog 5 TID with meals and SSI-R. Her BGs have been at goal on this regimen.   # Nausea: She presented in DKA in the setting of sepsis and has been having ongoing nausea. She has not had a gastric emptying study but I suspect this is 2/2 gastroparesis in the setting on uncontrolled T2DM. We will switch to IV Reglan today (has normal QTc) with plan to switch to PO before meals when her PO intake improves.   Dispo: Anticipated discharge in approximately 2-3 day(s).   Welford Roche, MD 04/07/2018, 5:58 AM Pager: [phone]

## 2018-04-08 ENCOUNTER — Inpatient Hospital Stay: Payer: Self-pay

## 2018-04-08 LAB — BASIC METABOLIC PANEL
ANION GAP: 8 (ref 5–15)
BUN: 39 mg/dL — ABNORMAL HIGH (ref 6–20)
CALCIUM: 8.4 mg/dL — AB (ref 8.9–10.3)
CO2: 21 mmol/L — AB (ref 22–32)
CREATININE: 1.71 mg/dL — AB (ref 0.44–1.00)
Chloride: 104 mmol/L (ref 98–111)
GFR calc non Af Amer: 36 mL/min — ABNORMAL LOW (ref >60)
GFR, EST AFRICAN AMERICAN: 42 mL/min — AB (ref >60)
Glucose, Bld: 141 mg/dL — ABNORMAL HIGH (ref 70–99)
Potassium: 4.1 mmol/L (ref 3.5–5.1)
SODIUM: 133 mmol/L — AB (ref 135–145)

## 2018-04-08 LAB — GLUCOSE, CAPILLARY
GLUCOSE-CAPILLARY: 135 mg/dL — AB (ref 70–99)
GLUCOSE-CAPILLARY: 106 mg/dL — AB (ref 70–99)
GLUCOSE-CAPILLARY: 108 mg/dL — AB (ref 70–99)
Glucose-Capillary: 119 mg/dL — ABNORMAL HIGH (ref 70–99)

## 2018-04-08 MED ORDER — HYDROCORTISONE 1 % EX CREA
TOPICAL_CREAM | CUTANEOUS | Status: DC | PRN
  Administered 2018-04-08: 1 via TOPICAL
  Filled 2018-04-08: qty 28

## 2018-04-08 MED ORDER — METOCLOPRAMIDE HCL 5 MG PO TABS
5.0000 mg | ORAL_TABLET | Freq: Three times a day (TID) | ORAL | Status: DC
  Administered 2018-04-08 – 2018-04-09 (×3): 5 mg via ORAL
  Filled 2018-04-08 (×3): qty 1

## 2018-04-08 MED ORDER — BACLOFEN 10 MG PO TABS
10.0000 mg | ORAL_TABLET | Freq: Three times a day (TID) | ORAL | Status: DC
  Administered 2018-04-08 – 2018-04-10 (×8): 10 mg via ORAL
  Filled 2018-04-08 (×9): qty 1

## 2018-04-08 MED ORDER — DICLOFENAC SODIUM 1 % TD GEL
4.0000 g | Freq: Four times a day (QID) | TRANSDERMAL | Status: DC
  Administered 2018-04-08 – 2018-04-10 (×9): 4 g via TOPICAL
  Filled 2018-04-08: qty 100

## 2018-04-08 MED ORDER — HYDROMORPHONE HCL 1 MG/ML IJ SOLN
0.5000 mg | Freq: Four times a day (QID) | INTRAMUSCULAR | Status: DC | PRN
  Administered 2018-04-08: 0.5 mg via INTRAVENOUS
  Filled 2018-04-08: qty 1

## 2018-04-08 NOTE — Progress Notes (Signed)
Pt refused CPAP for tonight.  RT will continue to monitor.

## 2018-04-08 NOTE — Progress Notes (Signed)
Medicine attending: Clinical status and medical database reviewed in detail with resident physician Dr Molli Hazard and I concur with her evaluation and management plan. The patient remains afebrile on Rocephin day 5 of 14 antibiotics for beta streptococcal sepsis, source cellulitis the skin of left BKA stump. Acute kidney injury on admission with peak creatinine 2.7 which continues to improve and is down to 1.7 today. Survey cultures remain negative.  Plan is to place a PICC line catheter today.  Complete antibiotics while on rehab.

## 2018-04-08 NOTE — Progress Notes (Signed)
Paged on call Dr. Sharon Seller to request medication for sleep. Doctor ordered ramelteon 8 mg po x1.  Pt given medication.

## 2018-04-08 NOTE — Discharge Summary (Signed)
Name: [name] [name] MRN: [id number] DOB: 1975-06-20 42 y.o. PCP: Ina Homes, MD  Date of Admission: 04/03/2018  3:09 AM Date of Discharge:  Attending Physician: No att. providers found  Discharge Diagnosis: 1. Group B Strep Bacteremia  Type II DM Acute on Chronic Diastolic Heart Failure  AKI on CKD Stage IIIa/b MDD Anemia of Chronic Disease  Discharge Medications: Allergies as of 04/10/2018      Reactions   Vancomycin Other (See Comments)   Acute renal failure suspected secondary to vanco      Medication List    STOP taking these medications   fluconazole 150 MG tablet Commonly known as:  DIFLUCAN   insulin glargine 100 unit/mL Sopn Commonly known as:  LANTUS Replaced by:  insulin glargine 100 UNIT/ML injection     TAKE these medications   amitriptyline 50 MG tablet Commonly known as:  ELAVIL TAKE 1 TABLET BY MOUTH AT BEDTIME   atorvastatin 20 MG tablet Commonly known as:  LIPITOR TAKE 1 TABLET BY MOUTH EVERY DAY   bumetanide 1 MG tablet Commonly known as:  BUMEX Take 1 tablet (1 mg total) by mouth 2 (two) times daily.   cefTRIAXone 2 g in sodium chloride 0.9 % 100 mL Inject 2 g into the vein daily.   cyclobenzaprine 10 MG tablet Commonly known as:  FLEXERIL TAKE 1 TABLET BY MOUTH EVERY TWELVE HOURS AS NEEDED for MUSCLE SPASMS What changed:  See the new instructions.   diclofenac sodium 1 % Gel Commonly known as:  VOLTAREN Apply 4 grams to affected area 4 times daily as needed for pain What changed:  See the new instructions.   docusate sodium 100 MG capsule Commonly known as:  COLACE Take 1 capsule (100 mg total) by mouth 2 (two) times daily. What changed:    when to take this  reasons to take this   feeding supplement (PRO-STAT SUGAR FREE 64) Liqd Take 30 mLs by mouth 2 (two) times daily.   FLUoxetine 20 MG/5ML solution Commonly known as:  PROZAC Take 15 mLs (60 mg total) by mouth daily.   ibuprofen 200 MG tablet Commonly  known as:  ADVIL,MOTRIN Take 3 tablets (600 mg total) by mouth every 8 (eight) hours as needed for moderate pain (Take with food and water).   insulin aspart 100 UNIT/ML FlexPen Commonly known as:  NOVOLOG Inject 13 Units into the skin 3 (three) times daily with meals. What changed:  how much to take   insulin glargine 100 UNIT/ML injection Commonly known as:  LANTUS Inject 0.2 mLs (20 Units total) into the skin 2 (two) times daily. Replaces:  insulin glargine 100 unit/mL Sopn   LYRICA 100 MG capsule Generic drug:  pregabalin TAKE 1 CAPSULE BY MOUTH 3 TIMES DAILY What changed:  how much to take   mupirocin ointment 2 % Commonly known as:  BACTROBAN Apply 1 application topically 2 (two) times daily. What changed:    when to take this  reasons to take this   nystatin powder Commonly known as:  MYCOSTATIN/NYSTOP Apply topically 4 (four) times daily. After cleaning and drying the affected area for two weeks What changed:    how much to take  when to take this  reasons to take this   oxyCODONE-acetaminophen 5-325 MG tablet Commonly known as:  PERCOCET/ROXICET Take 2 tablets by mouth every 6 (six) hours as needed for moderate pain. What changed:    how much to take  reasons to take this   polyethylene  glycol packet Commonly known as:  MIRALAX / GLYCOLAX Take 17 g by mouth daily. What changed:    when to take this  reasons to take this   ULTICARE INSULIN SYRINGE 31G X 1/4" 0.5 ML Misc Generic drug:  Insulin Syringe-Needle U-100 Use to inject insulin as directed   VICTOZA 18 MG/3ML Sopn Generic drug:  liraglutide Inject 0.3 mLs (1.8 mg total) into the skin daily.       Disposition and follow-up:   [name] [name] was transferred to Inpatient Rehab from [facility] Internal Medicine Service in Stable condition.  At the hospital follow up visit please address:  1.  GBS Bacteremia: Likely secondary to cellulitis in left BKA stump.  Repeat blood cultures negative with 14 days IV ceftriaxone started, completion date November 28th. PICC line placed, assure removal. Will need follow-up with PCP at [place] one week after CIR discharge.  Type II DM: admitted in DKA, had not been using her insulin. DKA resolved and glucose controlled with some hypoglycemia. Novolog was decreased with episode of hypoglycemia. She was discharged on Lantus 20U bid but may need this decreased further. In addition she will need a glucometer. BP mostly normotensive with some hypertension, proteinuria on UA and consider starting lisinopril.  AKI on CKD: secondary to dehydration and sepsis. Improved with fluid resuscitation from 2.7 to 1.5. Baseline around 1.1.   MDD: History of MDD on prozac 20 mg that has been acutely worsened with the death of her mother in 06/07/2023. Since that time she has not been taking any of her diabetes medications. She would benefit from follow-up with Dessie Coma with [facility].  Anemia of Chronic Disease Chronic anemia secondary to her multiple comorbidities with acute anemia secondary to sepsis and acute kidney injury. Hemoglobin trended down to 7 today. She was transfused 1U RBCs prior to CIR transfer.   2.  Labs / imaging needed at time of follow-up: CBC, BMP  3.  Pending labs/ test needing follow-up: none  Follow-up Appointments: Follow-up Information    Newt Minion, MD Follow up in 1 week(s).   Specialty:  Orthopedic Surgery Contact information: [place] [address] [address] [facility] Course by problem list: GBS Bacteremia 42yo female with PMH HFpEF, left BKA 03/2017 with 05/2017 revision, recurrent abscesses, TIIDM, OSA, and depression presenting to the ED with sepsis, DKA and left stump pain after several days of trying to use her prosthetic and not using her diabetes medications since June 07, 2023. She began to develop pain throughout her left thigh  and BKA site and was found to have group b strep bacteremia. Ortho was consulted and MRI was done to assess for necessity of further revision but showed no signs of osteo. Presumed source remained cellulitis of the left BKA as she had no other signs of infection, and she had diffuse edema of the left thigh. Dr. Sharol [facility] will make a custom stump shrinker for her. She was resuscitated and treated with ceftriaxone IV and placement of PICC line and resolution of symptoms. She was transferred to [facility] for rehab and will complete antibiotic therapy November 28th.  AKI on CKD AKI secondary to dehydration, nausea and vomiting. She was given fluids which and encouraged po intake of fluids. Her Cr trending from 2.7 to 1.5 at discharge.  DKA Resolved with IV insulin and she was switched to SSI-R, Lantus 20  bid, and novolog 5U tid with good control of her glucose. She had gastroparesis as well which limited her po intake and causes constipation and decreased appetite. She did have on episode of hypoglycemia prior to discharge and her novolog and SSI were decreased. MDD Present since her mother passed away in 2023/06/02, and she stated she has not been taking care of herself or taking her medications except her prozac. She denied SI or wanting to hurt herself. We continued her prozac and had multiple discussions about how she was feeling and what made her want to do well in the future. She expressed a desire to work on becoming healthier again and met with the chaplain as well. She would benefit with meeting with Dessie Coma at follow-up with the [facility].  Anemia of Chronic Disease Chronic anemia that trended down during her admission. Ferritin nml with decrease in iron. This was likely secondary to her acute sepsis and AKI. Hemoglobin was 10 at admission and trended to 7 at day of discharge. She was transfused 1U RBCs with increase in hemoglobin to 7.6.   Discharge Vitals:   BP (!) 158/83    Pulse (!) 106   Temp 98.2 F (36.8 C) (Oral)   Resp 20   Ht '5\' 10"'  (1.778 m)   Wt (!) 153.8 kg   LMP 02/19/2018 Comment: pt said she has irregular periods but she is NOT pregnant  SpO2 100%   BMI 48.65 kg/m   Pertinent Labs, Studies, and Procedures:   Creatinine 11/19: 1.5  Blood Culture 11/14: negative growth  11/14 MRI left BKA IMPRESSION: 1. Below the knee amputation of the left tibia and fibula. No evidence of osteomyelitis of the left tibia or fibula. Small amount of fluid at the tip of the tibial stump which may reflect an adventitial bursa. 2. Mild atrophy and severe muscle edema in the residual gastrocnemius musculature which is likely neurogenic versus less likely secondary to infectious or inflammatory myositis. 3. Generalized soft tissue edema in the distal thigh and lower leg which may be secondary to venous insufficiency versus fluid overload. 4. Tricompartmental osteoarthritis of the left knee. Electronically Signed   By: Kathreen Devoid   On: 04/04/2018 20:25  [place] Left BKA IMPRESSION: Diffuse prominent soft tissue edema. No focal fluid collections or mass lesions. Electronically Signed   By: Marcello Moores  Register   On: 04/03/2018 10:54  DG Chest Port 11/11 IMPRESSION: Lungs hypoexpanded. Vascular congestion noted. Increased interstitial markings raise concern for mild pulmonary edema. Electronically Signed   By: Garald Balding M.D.   On: 04/03/2018 04:06  Discharge Instructions: Discharge Instructions    Diet - low sodium heart healthy   Complete by:  As directed    Increase activity slowly   Complete by:  As directed       Signed: Marty Heck, DO 04/11/2018, 8:49 AM   Pager: [phone]

## 2018-04-08 NOTE — Plan of Care (Signed)
  Problem: Education: Goal: Knowledge of General Education information will improve Description Including pain rating scale, medication(s)/side effects and non-pharmacologic comfort measures Outcome: Progressing   Problem: Health Behavior/Discharge Planning: Goal: Ability to manage health-related needs will improve Outcome: Progressing   Problem: Clinical Measurements: Goal: Ability to maintain clinical measurements within normal limits will improve Outcome: Progressing Goal: Will remain free from infection Outcome: Progressing Goal: Respiratory complications will improve Outcome: Progressing Goal: Cardiovascular complication will be avoided Outcome: Progressing   Problem: Activity: Goal: Risk for activity intolerance will decrease Outcome: Progressing   Problem: Coping: Goal: Level of anxiety will decrease Outcome: Progressing   Problem: Elimination: Goal: Will not experience complications related to bowel motility Outcome: Progressing Goal: Will not experience complications related to urinary retention Outcome: Progressing   Problem: Safety: Goal: Ability to remain free from injury will improve Outcome: Progressing

## 2018-04-08 NOTE — Progress Notes (Signed)
   Subjective:  Patient continues to have pain but voices that she would like to get better and has a lot of reasons to work toward increasing her strength. She has not had any more nausea but continues to have decreased appetite. She is having BM and continues to work with PT. She does have some sensation of needing to continue urinating after peeing.   Objective:  Vital signs in last 24 hours: Vitals:   04/07/18 2000 04/08/18 0000 04/08/18 0321 04/08/18 0400  BP: 134/85 119/62 (!) 99/52 117/75  Pulse:   94 98  Resp: 12 17 17 18   Temp:   98.1 F (36.7 C)   TempSrc:   Oral   SpO2:   97% 98%  Weight:   (!) 156 kg   Height:       Physical Exam  Constitution: NAD, lying supine in bed Respiratory: non-labored breathing  Abdominal: soft, NTTP, non-distended MSK: left BKA GU: purewick in place, urine yellow Neuro: a&ox3, normal affect, pleasant Skin: +1 pitting edema, right leg venous stasis changes    Assessment/Plan:  Principal Problem:   Sepsis (HCC) Active Problems:   Uncontrolled type 2 diabetes mellitus (HCC)   OSA (obstructive sleep apnea)   Chronic kidney disease (CKD), stage III (moderate) (HCC)   Status post below knee amputation, left (HCC)   BKA stump complication (HCC)   Severe protein-calorie malnutrition ([place])   Bacteremia due to group B Streptococcus  Group B Strep Bacteremia Blood cultures continue to be negative at 48 hours. She continues to have pain although this appears somewhat improved. Previous to admission she was going to visit the pain clinic but had not yet made it. We will continue transitioning her to PO pain medications and working to control her stump pain so she can begin rehabilitation   - PICC ordered  - continue CTX until Nov 28th - add home balcofen 10 mg TID; decrease dilaudid to .5g mg q6h - voltaren gel prn  - cont. Percocet 10-325 q6h PRN - cont. PT/OT   Acute Kidney Injury on CKD Stage  AKI continuing to improve.   - am  BMP  Type II DM Nausea 2/2 gastoparesis Glucose currently at goal. BM are improved. Nausea is thought to be secondary to gastroparesis and has improved today although she continues to have little appetite  - Cont. lantus 20U bid, novologi 5 TID, SSI-R - transition to po reglan    MDD Long discussion concerning her depression, losing her mother, and family dynamic today. She reiterates she has no SI but misses her mother greatly. She shared about her family support system and reasons that she has to work harder to get better, including her 103yo niece and father, who will be released from prison next summer.   - cont. prozac 20 mg qd - recommend referral to Dessie Coma at outpatient follow-up   VTE: lovenox IVF: none Diet: heart healthy, carb modified Code: full   Dispo: Anticipated discharge in approximately 2 days.   Teah Votaw A, DO 04/08/2018, 7:00 AM Pager: [phone]

## 2018-04-09 DIAGNOSIS — I878 Other specified disorders of veins: Secondary | ICD-10-CM

## 2018-04-09 LAB — URINALYSIS, ROUTINE W REFLEX MICROSCOPIC
Bilirubin Urine: NEGATIVE
Glucose, UA: 50 mg/dL — AB
Ketones, ur: NEGATIVE mg/dL
Leukocytes, UA: NEGATIVE
NITRITE: NEGATIVE
PH: 6 (ref 5.0–8.0)
Protein, ur: 100 mg/dL — AB
SPECIFIC GRAVITY, URINE: 1.006 (ref 1.005–1.030)

## 2018-04-09 LAB — BASIC METABOLIC PANEL
Anion gap: 6 (ref 5–15)
BUN: 39 mg/dL — ABNORMAL HIGH (ref 6–20)
CALCIUM: 8.2 mg/dL — AB (ref 8.9–10.3)
CO2: 20 mmol/L — ABNORMAL LOW (ref 22–32)
Chloride: 108 mmol/L (ref 98–111)
Creatinine, Ser: 1.76 mg/dL — ABNORMAL HIGH (ref 0.44–1.00)
GFR calc Af Amer: 40 mL/min — ABNORMAL LOW (ref >60)
GFR, EST NON AFRICAN AMERICAN: 35 mL/min — AB (ref >60)
GLUCOSE: 95 mg/dL (ref 70–99)
POTASSIUM: 4.4 mmol/L (ref 3.5–5.1)
Sodium: 134 mmol/L — ABNORMAL LOW (ref 135–145)

## 2018-04-09 LAB — CBC
HCT: 24.4 % — ABNORMAL LOW (ref 36.0–46.0)
HEMOGLOBIN: 7.1 g/dL — AB (ref 12.0–15.0)
MCH: 23.8 pg — ABNORMAL LOW (ref 26.0–34.0)
MCHC: 29.1 g/dL — AB (ref 30.0–36.0)
MCV: 81.9 fL (ref 80.0–100.0)
Platelets: 367 10*3/uL (ref 150–400)
RBC: 2.98 MIL/uL — ABNORMAL LOW (ref 3.87–5.11)
RDW: 15.8 % — ABNORMAL HIGH (ref 11.5–15.5)
WBC: 11.8 10*3/uL — ABNORMAL HIGH (ref 4.0–10.5)
nRBC: 0.4 % — ABNORMAL HIGH (ref 0.0–0.2)

## 2018-04-09 LAB — FERRITIN: FERRITIN: 203 ng/mL (ref 11–307)

## 2018-04-09 LAB — IRON AND TIBC: Iron: 9 ug/dL — ABNORMAL LOW (ref 28–170)

## 2018-04-09 LAB — GLUCOSE, CAPILLARY
GLUCOSE-CAPILLARY: 108 mg/dL — AB (ref 70–99)
GLUCOSE-CAPILLARY: 116 mg/dL — AB (ref 70–99)
GLUCOSE-CAPILLARY: 156 mg/dL — AB (ref 70–99)
Glucose-Capillary: 105 mg/dL — ABNORMAL HIGH (ref 70–99)
Glucose-Capillary: 66 mg/dL — ABNORMAL LOW (ref 70–99)

## 2018-04-09 MED ORDER — LACTATED RINGERS IV SOLN
INTRAVENOUS | Status: AC
  Administered 2018-04-09: 12:00:00 via INTRAVENOUS

## 2018-04-09 MED ORDER — SODIUM CHLORIDE 0.9% FLUSH
10.0000 mL | INTRAVENOUS | Status: DC | PRN
  Administered 2018-04-10: 10 mL
  Filled 2018-04-09: qty 40

## 2018-04-09 MED ORDER — METOCLOPRAMIDE HCL 5 MG PO TABS: 5.0000 mg | ORAL_TABLET | Freq: Three times a day (TID) | ORAL | Status: DC | PRN

## 2018-04-09 NOTE — Progress Notes (Addendum)
   Subjective:  Patient states she has not had anymore nausea. She feels like she is taking too many pills, and we discussed why she is taking what she is taking. Pain in left BKA is decreased compared to previous but still present. Denies SOB.  Objective:  Vital signs in last 24 hours: Vitals:   04/08/18 0400 04/08/18 1158 04/08/18 2022 04/09/18 0538  BP: 117/75  126/73 125/78  Pulse: 98 98 95 97  Resp: 18 18    Temp:  98 F (36.7 C) 97.7 F (36.5 C) 98 F (36.7 C)  TempSrc:  Oral Oral Oral  SpO2: 98%  97% 99%  Weight:    (!) 153.8 kg  Height:       Physical Exam Constitution: lying supine in bed, diaphoretic, obese Cardio: RRR,  Respiratory: CTAB, no w/r/r Abdominal: NTTP, soft, non-distended MSK: left BKA Neuro: a&ox3 Skin: trace pitting edema, chronic venous stasis changes RLE   Assessment/Plan:  Principal Problem:   Bacteremia due to group B Streptococcus Active Problems:   Uncontrolled type 2 diabetes mellitus (HCC)   OSA (obstructive sleep apnea)   Chronic kidney disease (CKD), stage III (moderate) (HCC)   Sepsis (HCC)   Status post below knee amputation, left (HCC)   BKA stump complication (HCC)   Severe protein-calorie malnutrition ([place])  42yo female with PMH HFpEF, left BKA 03/2017 with 05/2017 revision, recurrent abscesses, TIIDM, OSA, and depression presenting to the ED with sepsis and left stump pain after several days of beginning to use her prosthetic.  Group B Strep Bacteremia PICC to be placed today. Day 5/14 ceftriaxone. Left stump pain is decreased. She somewhat dry on exam today and has had decrease po intake the past few days due to nausea likely secondary to her gastroparesis. We will hold her dilaudid today and see how her pain is controlled. If stable she will be able to transfer to CIR in the next day or two.   - PICC ordered - cont. CTX - 125 LR cc/hr 5 hours - hold dilaudid, cont. Baclofen & percocet 10-325 prn, and voltaren gel - cont.  PT/OT  - reglan prn nausea  AKI on CKD GFR stable but unchanged from yesterday. She appears volume down on exam today and has had decrease po intake. In addition, she stated yesterday she was not having dysuria but a feeling that she needed to continue urinating after finishing  - UA  - am BMP - IVF  TIIDM Controlled with current therapy.  Previous nausea and secondary likely 2/2 gastroparesis and opioid medications.   - Cont. SSI resistant, novolog 5U TID, lantus 20U bid. - reglan prn nausea - cont senokot   MDD  - cont. prozac 20 mg qd  VTE: lovenox IVF: 125cc/hr LR for 5 hours Diet: heart healthy/carb modified Code: full  Dispo: Anticipated discharge in approximately 1-2 days.   [name] [name] A, DO 04/09/2018, 7:35 AM Pager: [phone]

## 2018-04-09 NOTE — Progress Notes (Signed)
Ok to add stop date of 11/28 (14d from neg cx) for ceftriaxone per Dr. Sharon Seller.  Onnie Boer, PharmD, BCIDP, AAHIVP, CPP Infectious Disease Pharmacist 04/09/2018 1:40 PM

## 2018-04-09 NOTE — Progress Notes (Signed)
IP rehab admissions - I am following for potential acute inpatient rehab admission.  Awaiting tolerance with therapies and better pain control per rehab consult done by Dr. Naaman Plummer on 11/15.  Call me for questions.  [phone]

## 2018-04-09 NOTE — Progress Notes (Signed)
RT set up CPAP at patient beside. Patient stated she is not ready to go on but she can place her self on CPAP when she is ready. No assistance needed.

## 2018-04-09 NOTE — Progress Notes (Signed)
Peripherally Inserted Central Catheter/Midline Placement  The IV Nurse has discussed with the patient and/or persons authorized to consent for the patient, the purpose of this procedure and the potential benefits and risks involved with this procedure.  The benefits include less needle sticks, lab draws from the catheter, and the patient may be discharged home with the catheter. Risks include, but not limited to, infection, bleeding, blood clot (thrombus formation), and puncture of an artery; nerve damage and irregular heartbeat and possibility to perform a PICC exchange if needed/ordered by physician.  Alternatives to this procedure were also discussed.  Bard Power PICC patient education guide, fact sheet on infection prevention and patient information card has been provided to patient /or left at bedside.    PICC/Midline Placement Documentation  PICC Single Lumen 04/09/18 PICC Right Cephalic 40 cm 0 cm (Active)  Indication for Insertion or Continuance of Line Prolonged intravenous therapies 04/09/2018 11:20 AM  Exposed Catheter (cm) 0 cm 04/09/2018 11:20 AM  Site Assessment Clean;Dry;Intact 04/09/2018 11:20 AM  Line Status Blood return noted;Saline locked;Flushed 04/09/2018 11:20 AM  Dressing Type Transparent 04/09/2018 11:20 AM  Dressing Status Clean;Dry;Intact 04/09/2018 11:20 AM  Dressing Change Due 04/16/18 04/09/2018 11:20 AM       [name] [name] 04/09/2018, 11:31 AM

## 2018-04-09 NOTE — Progress Notes (Signed)
Physical Therapy Treatment Patient Details Name: [name] [name] MRN: [id number] DOB: 1975-11-07 Today's Date: 04/09/2018    History of Present Illness Pt is a 42 y.o. female admitted 04/03/18 with fever, tachycardia and severe L stump pain; worked up for L stump infection with borderline sepsis. MRI of LLE shows no evidence of steomyelitis; severe edema, tricompartmental OA of L knee. Per ortho, no indication for surgical intervention. PMH includes L transtibial amputation (03/2017; revision 05/2017), CKD2, OSA, uncontrolled DM2, peripheral neuropathy, fibromyalgia, bipolar disorder, anxiety.    PT Comments    Patient seen for mobility progression. Patient able to come to EOB with Min A +2 with patient performing majority or transfer independently - physical assist only needed for hip/trunk righting. Use of slide board for lateral transfer to recliner with Total A for setup, then Mod A +2 to complete. Patient with good motivation to perform OOB mobility. PT to continue to follow.     Follow Up Recommendations  CIR;Supervision for mobility/OOB     Equipment Recommendations  Other (comment)(defer)    Recommendations for Other Services Rehab consult;OT consult     Precautions / Restrictions Precautions Precautions: Fall    Mobility  Bed Mobility Overal bed mobility: Needs Assistance Bed Mobility: Supine to Sit     Supine to sit: Min assist;+2 for physical assistance     General bed mobility comments: Min A +2 to bring LE to EOB and for trunk righting with bed pad  Transfers Overall transfer level: Needs assistance Equipment used: Sliding board Transfers: Lateral/Scoot Transfers          Lateral/Scoot Transfers: Mod assist;+2 physical assistance General transfer comment: Mod A +2 for safe and successful slide board transfer to recliner; requires total A for set up with ceuing for safety and sequencing.   Ambulation/Gait                 Stairs              Wheelchair Mobility    Modified Rankin (Stroke Patients Only)       Balance Overall balance assessment: Needs assistance Sitting-balance support: Bilateral upper extremity supported;Feet supported Sitting balance-Leahy Scale: Fair                                      Cognition Arousal/Alertness: Awake/alert Behavior During Therapy: WFL for tasks assessed/performed Overall Cognitive Status: Within Functional Limits for tasks assessed                                 General Comments: Distracted by pain; becoming tearful stating "they keep saying "I'm not awake"      Exercises      General Comments        Pertinent Vitals/Pain Pain Assessment: 0-10 Pain Score: 10-Worst pain ever Pain Location: L residual limb Pain Descriptors / Indicators: Burning;Discomfort;Grimacing;Guarding;Moaning;Crying Pain Intervention(s): Limited activity within patient's tolerance;Monitored during session    Home Living                      Prior Function            PT Goals (current goals can now be found in the care plan section) Acute Rehab PT Goals Patient Stated Goal: PATIENT WANTS CIR PT Goal Formulation: With patient Time For Goal Achievement: 04/19/18 Potential to Achieve Goals:  Fair Progress towards PT goals: Progressing toward goals    Frequency    Min 3X/week      PT Plan Current plan remains appropriate    Co-evaluation              AM-PAC PT "6 Clicks" Daily Activity  Outcome Measure  Difficulty turning over in bed (including adjusting bedclothes, sheets and blankets)?: Unable Difficulty moving from lying on back to sitting on the side of the bed? : Unable Difficulty sitting down on and standing up from a chair with arms (e.g., wheelchair, bedside commode, etc,.)?: Unable Help needed moving to and from a bed to chair (including a wheelchair)?: A Lot Help needed walking in hospital room?: Total Help needed  climbing 3-5 steps with a railing? : Total 6 Click Score: 7    End of Session Equipment Utilized During Treatment: Gait belt Activity Tolerance: Patient limited by pain Patient left: in chair;with call bell/phone within reach;with chair alarm set Nurse Communication: Mobility status;Need for lift equipment PT Visit Diagnosis: Other abnormalities of gait and mobility (R26.89);Muscle weakness (generalized) (M62.81);Pain Pain - Right/Left: Left Pain - part of body: Leg     Time: 1400-1433 PT Time Calculation (min) (ACUTE ONLY): 33 min  Charges:  $Therapeutic Activity: 23-37 mins                     Lanney Gins, PT, DPT Supplemental Physical Therapist 04/09/18 2:54 PM Pager: [phone] Office: [phone]

## 2018-04-09 NOTE — Progress Notes (Signed)
Medicine attending: I examined this patient today and I concur with the evaluation and management plan as recorded by resident physician Dr Molli Hazard. Patient rather depressed today.  No acute clinical change.  We are tapering her parenteral narcotics and then hope to transfer her to inpatient rehabilitation. Afebrile day 5 of 14 ceftriaxone for group B streptococcal sepsis related to cellulitis that developed from an abrasion on the stump of her left BKA.  P ICC catheter to be placed today for ongoing antibiotic administration. Acute decline in kidney function on admission related to sepsis.  Improving with creatinine approaching her baseline and down to 1.8 today.  Peak level 2.7.  Baseline 1.4-1.6.

## 2018-04-10 ENCOUNTER — Other Ambulatory Visit: Payer: Self-pay

## 2018-04-10 ENCOUNTER — Encounter (HOSPITAL_COMMUNITY): Payer: Self-pay

## 2018-04-10 ENCOUNTER — Inpatient Hospital Stay (HOSPITAL_COMMUNITY)
Admission: RE | Admit: 2018-04-10 | Discharge: 2018-05-01 | DRG: 945 | Disposition: A | Discharge status: Home Health | Payer: Medicare Other | Source: Intra-hospital | Attending: Physical Medicine & Rehabilitation | Admitting: Physical Medicine & Rehabilitation

## 2018-04-10 DIAGNOSIS — K5900 Constipation, unspecified: Secondary | ICD-10-CM

## 2018-04-10 DIAGNOSIS — Z89512 Acquired absence of left leg below knee: Secondary | ICD-10-CM | POA: Diagnosis not present

## 2018-04-10 DIAGNOSIS — G8929 Other chronic pain: Secondary | ICD-10-CM | POA: Diagnosis present

## 2018-04-10 DIAGNOSIS — E11649 Type 2 diabetes mellitus with hypoglycemia without coma: Secondary | ICD-10-CM

## 2018-04-10 DIAGNOSIS — M609 Myositis, unspecified: Secondary | ICD-10-CM | POA: Diagnosis present

## 2018-04-10 DIAGNOSIS — Z6841 Body Mass Index (BMI) 40.0 and over, adult: Secondary | ICD-10-CM | POA: Diagnosis not present

## 2018-04-10 DIAGNOSIS — K3184 Gastroparesis: Secondary | ICD-10-CM

## 2018-04-10 DIAGNOSIS — D62 Acute posthemorrhagic anemia: Secondary | ICD-10-CM

## 2018-04-10 DIAGNOSIS — Z79899 Other long term (current) drug therapy: Secondary | ICD-10-CM

## 2018-04-10 DIAGNOSIS — Z993 Dependence on wheelchair: Secondary | ICD-10-CM | POA: Diagnosis not present

## 2018-04-10 DIAGNOSIS — R609 Edema, unspecified: Secondary | ICD-10-CM | POA: Diagnosis not present

## 2018-04-10 DIAGNOSIS — R5381 Other malaise: Principal | ICD-10-CM | POA: Diagnosis present

## 2018-04-10 DIAGNOSIS — R7881 Bacteremia: Secondary | ICD-10-CM | POA: Diagnosis not present

## 2018-04-10 DIAGNOSIS — K5909 Other constipation: Secondary | ICD-10-CM | POA: Diagnosis present

## 2018-04-10 DIAGNOSIS — M549 Dorsalgia, unspecified: Secondary | ICD-10-CM | POA: Diagnosis present

## 2018-04-10 DIAGNOSIS — N179 Acute kidney failure, unspecified: Secondary | ICD-10-CM

## 2018-04-10 DIAGNOSIS — R072 Precordial pain: Secondary | ICD-10-CM | POA: Diagnosis not present

## 2018-04-10 DIAGNOSIS — M60004 Infective myositis, unspecified left leg: Secondary | ICD-10-CM

## 2018-04-10 DIAGNOSIS — E162 Hypoglycemia, unspecified: Secondary | ICD-10-CM

## 2018-04-10 DIAGNOSIS — F4321 Adjustment disorder with depressed mood: Secondary | ICD-10-CM | POA: Diagnosis present

## 2018-04-10 DIAGNOSIS — K219 Gastro-esophageal reflux disease without esophagitis: Secondary | ICD-10-CM | POA: Diagnosis present

## 2018-04-10 DIAGNOSIS — D638 Anemia in other chronic diseases classified elsewhere: Secondary | ICD-10-CM

## 2018-04-10 DIAGNOSIS — D72829 Elevated white blood cell count, unspecified: Secondary | ICD-10-CM

## 2018-04-10 DIAGNOSIS — I129 Hypertensive chronic kidney disease with stage 1 through stage 4 chronic kidney disease, or unspecified chronic kidney disease: Secondary | ICD-10-CM | POA: Diagnosis present

## 2018-04-10 DIAGNOSIS — F17210 Nicotine dependence, cigarettes, uncomplicated: Secondary | ICD-10-CM | POA: Diagnosis present

## 2018-04-10 DIAGNOSIS — E1142 Type 2 diabetes mellitus with diabetic polyneuropathy: Secondary | ICD-10-CM | POA: Diagnosis present

## 2018-04-10 DIAGNOSIS — F410 Panic disorder [episodic paroxysmal anxiety] without agoraphobia: Secondary | ICD-10-CM | POA: Diagnosis present

## 2018-04-10 DIAGNOSIS — F321 Major depressive disorder, single episode, moderate: Secondary | ICD-10-CM

## 2018-04-10 DIAGNOSIS — E785 Hyperlipidemia, unspecified: Secondary | ICD-10-CM | POA: Diagnosis present

## 2018-04-10 DIAGNOSIS — E039 Hypothyroidism, unspecified: Secondary | ICD-10-CM | POA: Diagnosis present

## 2018-04-10 DIAGNOSIS — I10 Essential (primary) hypertension: Secondary | ICD-10-CM | POA: Diagnosis not present

## 2018-04-10 DIAGNOSIS — E669 Obesity, unspecified: Secondary | ICD-10-CM

## 2018-04-10 DIAGNOSIS — I872 Venous insufficiency (chronic) (peripheral): Secondary | ICD-10-CM | POA: Diagnosis present

## 2018-04-10 DIAGNOSIS — F3130 Bipolar disorder, current episode depressed, mild or moderate severity, unspecified: Secondary | ICD-10-CM | POA: Diagnosis present

## 2018-04-10 DIAGNOSIS — M797 Fibromyalgia: Secondary | ICD-10-CM | POA: Diagnosis present

## 2018-04-10 DIAGNOSIS — D649 Anemia, unspecified: Secondary | ICD-10-CM

## 2018-04-10 DIAGNOSIS — E871 Hypo-osmolality and hyponatremia: Secondary | ICD-10-CM | POA: Diagnosis not present

## 2018-04-10 DIAGNOSIS — N183 Chronic kidney disease, stage 3 (moderate): Secondary | ICD-10-CM | POA: Diagnosis present

## 2018-04-10 DIAGNOSIS — R600 Localized edema: Secondary | ICD-10-CM

## 2018-04-10 DIAGNOSIS — B951 Streptococcus, group B, as the cause of diseases classified elsewhere: Secondary | ICD-10-CM | POA: Diagnosis present

## 2018-04-10 DIAGNOSIS — E875 Hyperkalemia: Secondary | ICD-10-CM

## 2018-04-10 DIAGNOSIS — E1122 Type 2 diabetes mellitus with diabetic chronic kidney disease: Secondary | ICD-10-CM | POA: Diagnosis present

## 2018-04-10 DIAGNOSIS — D509 Iron deficiency anemia, unspecified: Secondary | ICD-10-CM | POA: Diagnosis present

## 2018-04-10 DIAGNOSIS — Z9889 Other specified postprocedural states: Secondary | ICD-10-CM

## 2018-04-10 DIAGNOSIS — E1143 Type 2 diabetes mellitus with diabetic autonomic (poly)neuropathy: Secondary | ICD-10-CM

## 2018-04-10 DIAGNOSIS — Z818 Family history of other mental and behavioral disorders: Secondary | ICD-10-CM

## 2018-04-10 DIAGNOSIS — E1140 Type 2 diabetes mellitus with diabetic neuropathy, unspecified: Secondary | ICD-10-CM | POA: Diagnosis present

## 2018-04-10 DIAGNOSIS — G4733 Obstructive sleep apnea (adult) (pediatric): Secondary | ICD-10-CM | POA: Diagnosis present

## 2018-04-10 DIAGNOSIS — Z992 Dependence on renal dialysis: Secondary | ICD-10-CM | POA: Diagnosis present

## 2018-04-10 DIAGNOSIS — E1169 Type 2 diabetes mellitus with other specified complication: Secondary | ICD-10-CM | POA: Diagnosis not present

## 2018-04-10 DIAGNOSIS — Z95828 Presence of other vascular implants and grafts: Secondary | ICD-10-CM

## 2018-04-10 DIAGNOSIS — Z794 Long term (current) use of insulin: Secondary | ICD-10-CM | POA: Diagnosis not present

## 2018-04-10 DIAGNOSIS — N186 End stage renal disease: Secondary | ICD-10-CM | POA: Diagnosis present

## 2018-04-10 LAB — CULTURE, BLOOD (ROUTINE X 2)
Culture: NO GROWTH
Culture: NO GROWTH
SPECIAL REQUESTS: ADEQUATE

## 2018-04-10 LAB — CBC
HEMATOCRIT: 23.8 % — AB (ref 36.0–46.0)
HEMOGLOBIN: 7 g/dL — AB (ref 12.0–15.0)
MCH: 24.6 pg — AB (ref 26.0–34.0)
MCHC: 29.4 g/dL — AB (ref 30.0–36.0)
MCV: 83.5 fL (ref 80.0–100.0)
Platelets: 343 10*3/uL (ref 150–400)
RBC: 2.85 MIL/uL — ABNORMAL LOW (ref 3.87–5.11)
RDW: 15.6 % — AB (ref 11.5–15.5)
WBC: 10.7 10*3/uL — ABNORMAL HIGH (ref 4.0–10.5)
nRBC: 0.5 % — ABNORMAL HIGH (ref 0.0–0.2)

## 2018-04-10 LAB — BASIC METABOLIC PANEL
Anion gap: 5 (ref 5–15)
BUN: 34 mg/dL — ABNORMAL HIGH (ref 6–20)
CHLORIDE: 109 mmol/L (ref 98–111)
CO2: 23 mmol/L (ref 22–32)
CREATININE: 1.5 mg/dL — AB (ref 0.44–1.00)
Calcium: 8.3 mg/dL — ABNORMAL LOW (ref 8.9–10.3)
GFR calc non Af Amer: 42 mL/min — ABNORMAL LOW (ref >60)
GFR, EST AFRICAN AMERICAN: 49 mL/min — AB (ref >60)
GLUCOSE: 153 mg/dL — AB (ref 70–99)
Potassium: 4.5 mmol/L (ref 3.5–5.1)
Sodium: 137 mmol/L (ref 135–145)

## 2018-04-10 LAB — GLUCOSE, CAPILLARY
Glucose-Capillary: 131 mg/dL — ABNORMAL HIGH (ref 70–99)
Glucose-Capillary: 118 mg/dL — ABNORMAL HIGH (ref 70–99)
Glucose-Capillary: 196 mg/dL — ABNORMAL HIGH (ref 70–99)
Glucose-Capillary: 164 mg/dL — ABNORMAL HIGH (ref 70–99)

## 2018-04-10 LAB — PREPARE RBC (CROSSMATCH)

## 2018-04-10 MED ORDER — POLYSACCHARIDE IRON COMPLEX 150 MG PO CAPS
150.0000 mg | ORAL_CAPSULE | Freq: Two times a day (BID) | ORAL | Status: DC
  Administered 2018-04-11 – 2018-05-01 (×41): 150 mg via ORAL
  Filled 2018-04-10 (×41): qty 1

## 2018-04-10 MED ORDER — PROCHLORPERAZINE MALEATE 5 MG PO TABS
5.0000 mg | ORAL_TABLET | Freq: Four times a day (QID) | ORAL | Status: DC | PRN
  Administered 2018-04-25 – 2018-04-28 (×4): 10 mg via ORAL
  Filled 2018-04-10 (×4): qty 2

## 2018-04-10 MED ORDER — PRO-STAT SUGAR FREE PO LIQD
30.0000 mL | Freq: Two times a day (BID) | ORAL | Status: DC
  Administered 2018-04-10 – 2018-05-01 (×42): 30 mL via ORAL
  Filled 2018-04-10 (×42): qty 30

## 2018-04-10 MED ORDER — ACETAMINOPHEN 325 MG PO TABS
325.0000 mg | ORAL_TABLET | ORAL | Status: DC | PRN
  Administered 2018-04-27: 650 mg via ORAL
  Filled 2018-04-10 (×2): qty 2

## 2018-04-10 MED ORDER — SODIUM CHLORIDE 0.9 % IV SOLN
2.0000 g | INTRAVENOUS | Status: AC
  Administered 2018-04-11 – 2018-04-18 (×8): 2 g via INTRAVENOUS
  Filled 2018-04-10 (×9): qty 20

## 2018-04-10 MED ORDER — BISACODYL 10 MG RE SUPP: 10.0000 mg | Freq: Every day | RECTAL | Status: DC | PRN

## 2018-04-10 MED ORDER — MAGNESIUM CITRATE PO SOLN
1.0000 | Freq: Once | ORAL | Status: AC
  Administered 2018-04-10: 1 via ORAL
  Filled 2018-04-10: qty 296

## 2018-04-10 MED ORDER — INSULIN GLARGINE 100 UNIT/ML ~~LOC~~ SOLN: 20.0000 [IU] | Freq: Two times a day (BID) | SUBCUTANEOUS | 11 refills | Status: DC

## 2018-04-10 MED ORDER — SODIUM CHLORIDE 0.9 % IV SOLN: 2.0000 g | INTRAVENOUS | Status: DC

## 2018-04-10 MED ORDER — INSULIN ASPART 100 UNIT/ML ~~LOC~~ SOLN: 0.0000 [IU] | Freq: Every day | SUBCUTANEOUS | Status: DC

## 2018-04-10 MED ORDER — SODIUM CHLORIDE 0.9% IV SOLUTION: Freq: Once | INTRAVENOUS | Status: DC

## 2018-04-10 MED ORDER — DIPHENHYDRAMINE HCL 12.5 MG/5ML PO ELIX
12.5000 mg | ORAL_SOLUTION | Freq: Four times a day (QID) | ORAL | Status: DC | PRN
  Administered 2018-04-15: 12.5 mg via ORAL
  Filled 2018-04-10: qty 0

## 2018-04-10 MED ORDER — INSULIN GLARGINE 100 UNIT/ML ~~LOC~~ SOLN
20.0000 [IU] | Freq: Two times a day (BID) | SUBCUTANEOUS | Status: DC
  Administered 2018-04-10 – 2018-04-12 (×5): 20 [IU] via SUBCUTANEOUS
  Filled 2018-04-10 (×6): qty 0.2

## 2018-04-10 MED ORDER — OXYCODONE-ACETAMINOPHEN 5-325 MG PO TABS: 2.0000 | ORAL_TABLET | Freq: Four times a day (QID) | ORAL | 0 refills | Status: DC | PRN

## 2018-04-10 MED ORDER — INSULIN ASPART 100 UNIT/ML ~~LOC~~ SOLN
0.0000 [IU] | Freq: Three times a day (TID) | SUBCUTANEOUS | Status: DC
  Administered 2018-04-11 – 2018-04-16 (×7): 2 [IU] via SUBCUTANEOUS
  Administered 2018-04-22: 3 [IU] via SUBCUTANEOUS
  Administered 2018-04-24 – 2018-04-25 (×2): 2 [IU] via SUBCUTANEOUS

## 2018-04-10 MED ORDER — HYDROCORTISONE 1 % EX CREA
TOPICAL_CREAM | CUTANEOUS | Status: DC | PRN
  Administered 2018-04-14 – 2018-04-15 (×2): via TOPICAL
  Filled 2018-04-10: qty 28

## 2018-04-10 MED ORDER — PROCHLORPERAZINE 25 MG RE SUPP: 12.5000 mg | Freq: Four times a day (QID) | RECTAL | Status: DC | PRN

## 2018-04-10 MED ORDER — TRAZODONE HCL 50 MG PO TABS
25.0000 mg | ORAL_TABLET | Freq: Every evening | ORAL | Status: DC | PRN
  Administered 2018-04-19 – 2018-04-26 (×7): 50 mg via ORAL
  Filled 2018-04-10 (×9): qty 1

## 2018-04-10 MED ORDER — PROCHLORPERAZINE EDISYLATE 10 MG/2ML IJ SOLN: 5.0000 mg | Freq: Four times a day (QID) | INTRAMUSCULAR | Status: DC | PRN

## 2018-04-10 MED ORDER — ENOXAPARIN SODIUM 80 MG/0.8ML ~~LOC~~ SOLN
80.0000 mg | SUBCUTANEOUS | Status: DC
  Administered 2018-04-10 – 2018-04-25 (×16): 80 mg via SUBCUTANEOUS
  Filled 2018-04-10 (×16): qty 0.8

## 2018-04-10 MED ORDER — OXYCODONE-ACETAMINOPHEN 5-325 MG PO TABS
1.0000 | ORAL_TABLET | ORAL | Status: DC | PRN
  Administered 2018-04-11 – 2018-04-13 (×7): 2 via ORAL
  Filled 2018-04-10 (×7): qty 2

## 2018-04-10 MED ORDER — INSULIN ASPART 100 UNIT/ML ~~LOC~~ SOLN: 0.0000 [IU] | Freq: Three times a day (TID) | SUBCUTANEOUS | Status: DC

## 2018-04-10 MED ORDER — FLUOXETINE HCL 20 MG PO CAPS
60.0000 mg | ORAL_CAPSULE | Freq: Every day | ORAL | Status: DC
  Administered 2018-04-11 – 2018-04-14 (×4): 60 mg via ORAL
  Filled 2018-04-10 (×4): qty 3

## 2018-04-10 MED ORDER — SODIUM CHLORIDE 0.9% FLUSH
10.0000 mL | INTRAVENOUS | Status: DC | PRN
  Administered 2018-04-11 – 2018-04-16 (×3): 10 mL
  Administered 2018-04-20: 20 mL
  Administered 2018-04-20: 10 mL
  Administered 2018-04-23: 20 mL
  Administered 2018-04-25 – 2018-04-27 (×2): 10 mL
  Filled 2018-04-10 (×8): qty 40

## 2018-04-10 MED ORDER — BACLOFEN 10 MG PO TABS
10.0000 mg | ORAL_TABLET | Freq: Three times a day (TID) | ORAL | Status: DC
Start: 2018-04-10 — End: 2018-04-18
  Administered 2018-04-10 – 2018-04-18 (×23): 10 mg via ORAL
  Filled 2018-04-10 (×24): qty 1

## 2018-04-10 MED ORDER — PRO-STAT SUGAR FREE PO LIQD: 30.0000 mL | Freq: Two times a day (BID) | ORAL | 0 refills | Status: DC

## 2018-04-10 MED ORDER — ALUM & MAG HYDROXIDE-SIMETH 200-200-20 MG/5ML PO SUSP
30.0000 mL | ORAL | Status: DC | PRN
  Administered 2018-04-14: 30 mL via ORAL
  Filled 2018-04-10 (×2): qty 30

## 2018-04-10 MED ORDER — TRAMADOL HCL 50 MG PO TABS
50.0000 mg | ORAL_TABLET | Freq: Four times a day (QID) | ORAL | Status: DC | PRN
  Administered 2018-04-11 – 2018-05-01 (×16): 50 mg via ORAL
  Filled 2018-04-10 (×16): qty 1

## 2018-04-10 MED ORDER — POLYETHYLENE GLYCOL 3350 17 G PO PACK
17.0000 g | PACK | Freq: Two times a day (BID) | ORAL | Status: DC
  Filled 2018-04-10: qty 1

## 2018-04-10 MED ORDER — FLEET ENEMA 7-19 GM/118ML RE ENEM: 1.0000 | ENEMA | Freq: Once | RECTAL | Status: DC | PRN

## 2018-04-10 MED ORDER — DICLOFENAC SODIUM 1 % TD GEL
4.0000 g | Freq: Four times a day (QID) | TRANSDERMAL | Status: DC
  Administered 2018-04-10 – 2018-04-18 (×29): 4 g via TOPICAL
  Filled 2018-04-10: qty 100

## 2018-04-10 MED ORDER — GUAIFENESIN-DM 100-10 MG/5ML PO SYRP: 5.0000 mL | ORAL_SOLUTION | Freq: Four times a day (QID) | ORAL | Status: DC | PRN

## 2018-04-10 MED ORDER — SENNOSIDES-DOCUSATE SODIUM 8.6-50 MG PO TABS
2.0000 | ORAL_TABLET | Freq: Two times a day (BID) | ORAL | Status: DC
  Administered 2018-04-14 – 2018-05-01 (×24): 2 via ORAL
  Filled 2018-04-10 (×34): qty 2

## 2018-04-10 NOTE — Progress Notes (Signed)
   04/10/18 1600  Clinical Encounter Type  Visited With Patient not available  Visit Type Follow-up   Attempted f/u visit w/ pt, but she appeared to be sleeping and did not respond to initial knock and greeting.  Myra Gianotti resident, [phone]

## 2018-04-10 NOTE — Progress Notes (Signed)
Physical Therapy Treatment Patient Details Name: [name] [name] MRN: [id number] DOB: 09-Aug-1975 Today's Date: 04/10/2018    History of Present Illness Pt is a 42 y.o. female admitted 04/03/18 with fever, tachycardia and severe L stump pain; worked up for L stump infection with borderline sepsis. MRI of LLE shows no evidence of steomyelitis; severe edema, tricompartmental OA of L knee. Per ortho, no indication for surgical intervention. PMH includes L transtibial amputation (03/2017; revision 05/2017), CKD2, OSA, uncontrolled DM2, peripheral neuropathy, fibromyalgia, bipolar disorder, anxiety.    PT Comments    Patient seen for mobility progression. Pt requires min guard for bed mobility. Attempted sit to stand transfers with RW and Stedy standing frame but unable to progress to standing this session. Pt is able to transfer EOB to drop arm recliner with mod/max A +2 and use of slide board with max cues. Lift pad placed in recliner for transfer back to bed as this continues to be the safest way for nursing staff to transfer pt in/out bed.  Continue to progress as tolerated.     Follow Up Recommendations  CIR;Supervision for mobility/OOB     Equipment Recommendations  Other (comment)(defer)    Recommendations for Other Services       Precautions / Restrictions Precautions Precautions: Fall    Mobility  Bed Mobility Overal bed mobility: Needs Assistance Bed Mobility: Supine to Sit     Supine to sit: Min guard     General bed mobility comments: min guard for safety; increased time and effort; use of rails to get into long sitting   Transfers Overall transfer level: Needs assistance Equipment used: Sliding board;Rolling walker (2 wheeled) Transfers: Lateral/Scoot Transfers;Sit to/from Stand Sit to Stand: Total assist;+2 physical assistance;From elevated surface        Lateral/Scoot Transfers: +2 physical assistance;Max assist;With slide board General transfer comment:  attempted sit to stand from elevated bed height with RW then Stedy standing frame but unsuccessful; pt able to laterally scoot with use of slide board to drop arm recliner with max A +2 and max cues for anterior translation of trunk and sequencing with R knee blocked; pt has tendency for posterior lean while transfering  Ambulation/Gait                 Stairs             Wheelchair Mobility    Modified Rankin (Stroke Patients Only)       Balance Overall balance assessment: Needs assistance Sitting-balance support: Bilateral upper extremity supported;Feet supported Sitting balance-Leahy Scale: Poor                                      Cognition Arousal/Alertness: Awake/alert Behavior During Therapy: WFL for tasks assessed/performed Overall Cognitive Status: Within Functional Limits for tasks assessed                                 General Comments: distracted by pain      Exercises      General Comments        Pertinent Vitals/Pain Pain Assessment: Faces Faces Pain Scale: Hurts even more Pain Location: L residual limb Pain Descriptors / Indicators: Grimacing;Guarding;Moaning;Cramping;Aching Pain Intervention(s): Limited activity within patient's tolerance;Monitored during session;Repositioned    Home Living  Prior Function            PT Goals (current goals can now be found in the care plan section) Progress towards PT goals: Progressing toward goals    Frequency    Min 3X/week      PT Plan Current plan remains appropriate    Co-evaluation PT/OT/SLP Co-Evaluation/Treatment: Yes Reason for Co-Treatment: For patient/therapist safety;To address functional/ADL transfers PT goals addressed during session: Mobility/safety with mobility;Balance;Proper use of DME;Strengthening/ROM        AM-PAC PT "6 Clicks" Daily Activity  Outcome Measure  Difficulty turning over in bed (including  adjusting bedclothes, sheets and blankets)?: Unable Difficulty moving from lying on back to sitting on the side of the bed? : Unable Difficulty sitting down on and standing up from a chair with arms (e.g., wheelchair, bedside commode, etc,.)?: Unable Help needed moving to and from a bed to chair (including a wheelchair)?: A Lot Help needed walking in hospital room?: Total Help needed climbing 3-5 steps with a railing? : Total 6 Click Score: 7    End of Session Equipment Utilized During Treatment: Gait belt Activity Tolerance: Patient limited by pain;Patient limited by fatigue Patient left: in chair;with call bell/phone within reach Nurse Communication: Mobility status;Need for lift equipment PT Visit Diagnosis: Other abnormalities of gait and mobility (R26.89);Muscle weakness (generalized) (M62.81);Pain Pain - Right/Left: Left Pain - part of body: Leg     Time: 6734-1937 PT Time Calculation (min) (ACUTE ONLY): 35 min  Charges:  $Therapeutic Activity: 8-22 mins                     Earney Navy, PTA Acute Rehabilitation Services Pager: [phone] Office: [phone]     Darliss Cheney 04/10/2018, 11:38 AM

## 2018-04-10 NOTE — PMR Pre-admission (Signed)
PMR Admission Coordinator Pre-Admission Assessment  Patient: [name] [name] is an 42 y.o., female MRN: [id number] DOB: April 05, 1976 Height: 5\' 10"  (177.8 cm) Weight: (!) 153.8 kg              Insurance Information HMO: No   PPO:       PCP:       IPA:       80/20:       OTHER:   PRIMARY:  Medicare A and B      Policy#: [id number]      Subscriber: patient CM Name:        Phone#:       Fax#:   Pre-Cert#:        Employer: Disabled Benefits:  Phone #:       Name: Checked in [place]. Date: 09/20/13     Deduct:  $1364      Out of Pocket Max: None      Life Max: N/A CIR: 100%      SNF: 100 days Outpatient: 80%     Co-Pay: 20% Home Health: 100%      Co-Pay: none DME: 80%     Co-Pay: 20% Providers: patient's choice  SECONDARY: Medicaid of [place]      Policy#: [id number]      Subscriber: patient CM Name:        Phone#:       Fax#:   Pre-Cert#:        Employer: Disabled Benefits:  Phone #: [phone]     Name:   Eff. Date: eligible 04/10/18 with coverage code [id number]     Deduct:        Out of Pocket Max:        Life Max:   CIR:        SNF:   Outpatient:       Co-Pay:   Home Health:        Co-Pay:   DME:       Co-Pay:    Medicaid Application Date:        Case Manager:   Disability Application Date:        Case Worker:    Emergency Contact Information Contact Information    Name Relation Home Work Mobile   [place] Sister   [phone]     Current Medical History  Patient Admitting Diagnosis: Left lower ext myositis/sepsis, hx of left BKA  History of Present Illness:  A 42 y.o. female with history of T2DM, OSA, CKD, bipolar disorder, anxiety disorder, morbid obesity--BMI 50, ongoing tobacco use, left BKA (CIR 03/2017) who has had falls with increase in edema and pain left stump. She who was admitted on 04/03/2018 with fever due to sepsis from  group B bacteremia.  She was started on broad-spectrum IV antibiotics for treatment as well as IV Dilaudid for pain control  MRI of LLE showed generalized soft tissue edema in distal thigh, severe muscle edema in residual gastrocnemius question infectious or due to inflammatory myositis. Dr. Sharol Given felt that there was no need for surgical intervention and antibiotics narrowed to ceftriaxone with recommendations to complete 2 weeks course.  She has had issues with nausea with poor appetite as well as pain with deficits in mobility.  Therapy evaluations done yesterday and CIR was recommended for follow-up therapy   Past Medical History  Past Medical History:  Diagnosis Date  . Abdominal muscle pain 09/08/2016  . Abnormal Pap smear of cervix 2009  . Abscess  history of multiple abscesses  . Acute bilateral low back pain 02/13/2017  . Acute blood loss anemia   . Anemia of chronic disease 2002  . Anxiety    Panic attacks  . Bilateral lower extremity edema 05/13/2016  . Bipolar disorder ([place])   . Cellulitis 05/21/2014   right eye  . Chronic bronchitis ([place])    "get it q yr" (05/13/2013)  . Chronic pain   . Depression   . Edema of lower extremity   . Endocarditis 2002   subacute bacterial endocarditis.   . Family history of anesthesia complication    "my mom has a hard time coming out from under"  . Fibromyalgia   . GERD (gastroesophageal reflux disease)    occ  . Heart murmur   . History of blood transfusion    "just low blood count" (05/13/2013)  . Hyperlipidemia   . Hypertension   . Hypothyroidism   . Hypothyroidism, adult 03/21/2014  . Leukocytosis   . Necrosis ([place])    and ulceration  . Obesity   . OSA on CPAP    does not wear CPAP  . Peripheral neuropathy   . Type II diabetes mellitus (HCC)    Type  II    Family History  family history includes Diabetes in her father, mother, and paternal grandmother; Heart failure in her mother and paternal grandmother; Kidney disease in her father and mother; Other in her other.  Prior Rehab/Hospitalizations: Had [facility] after amputation 1 yr ago.  Went to a  SNF on [address] for a week, but does not want to go there again.  Has the patient had major surgery during 100 days prior to admission? No  Current Medications   Current Facility-Administered Medications:  .  0.9 %  sodium chloride infusion (Manually program via Guardrails IV Fluids), , Intravenous, Once, Santos-Sanchez, Idalys, MD .  0.9 %  sodium chloride infusion, , Intravenous, Continuous, Pollina, Gwenyth Allegra, MD, Last Rate: 10 mL/hr at 04/10/18 1046 .  0.9 %  sodium chloride infusion, , Intravenous, PRN, Axel Filler, MD, Last Rate: 10 mL/hr at 04/03/18 1713, 500 mL at 04/03/18 1713 .  acetaminophen (TYLENOL) tablet 650 mg, 650 mg, Oral, Q6H PRN, 650 mg at 04/05/18 0415 **OR** acetaminophen (TYLENOL) suppository 650 mg, 650 mg, Rectal, Q6H PRN, Hoffman, Jessica Ratliff, DO, 650 mg at 04/03/18 1706 .  baclofen (LIORESAL) tablet 10 mg, 10 mg, Oral, TID, Seawell, Jaimie A, DO, 10 mg at 04/10/18 1118 .  cefTRIAXone (ROCEPHIN) 2 g in sodium chloride 0.9 % 100 mL IVPB, 2 g, Intravenous, Q24H, Annia Belt, MD, Last Rate: 200 mL/hr at 04/09/18 1213, 2 g at 04/09/18 1213 .  chlorhexidine (HIBICLENS) 4 % liquid 4 application, 60 mL, Topical, Once, Rayburn, Shawn Montgomery, PA-C .  diclofenac sodium (VOLTAREN) 1 % transdermal gel 4 g, 4 g, Topical, QID, Seawell, Jaimie A, DO, 4 g at 04/10/18 1121 .  enoxaparin (LOVENOX) injection 80 mg, 80 mg, Subcutaneous, Q24H, Corinda Gubler, RPH, 80 mg at 04/10/18 1117 .  feeding supplement (PRO-STAT SUGAR FREE 64) liquid 30 mL, 30 mL, Oral, BID, Axel Filler, MD, 30 mL at 04/10/18 1118 .  FLUoxetine (PROZAC) 20 MG/5ML solution 60 mg, 60 mg, Oral, Daily, Santos-Sanchez, Idalys, MD, 60 mg at 04/10/18 1118 .  hydrocortisone cream 1 %, , Topical, PRN, Annia Belt, MD, 1 application at 33/54/56 0425 .  insulin aspart (novoLOG) injection 0-15 Units, 0-15 Units, Subcutaneous, TID WC, Seawell, Jaimie A,  DO .  insulin  aspart (novoLOG) injection 0-5 Units, 0-5 Units, Subcutaneous, QHS, Valinda Party, DO, 2 Units at 04/05/18 2217 .  insulin glargine (LANTUS) injection 20 Units, 20 Units, Subcutaneous, BID, Santos-Sanchez, Idalys, MD, 20 Units at 04/10/18 1117 .  metoCLOPramide (REGLAN) tablet 5 mg, 5 mg, Oral, Q8H PRN, Seawell, Jaimie A, DO .  oxyCODONE-acetaminophen (PERCOCET/ROXICET) 5-325 MG per tablet 2 tablet, 2 tablet, Oral, Q6H PRN, Welford Roche, MD, 2 tablet at 04/09/18 2305 .  polyethylene glycol (MIRALAX / GLYCOLAX) packet 17 g, 17 g, Oral, Daily, Seawell, Jaimie A, DO, 17 g at 04/10/18 1117 .  senna-docusate (Senokot-S) tablet 2 tablet, 2 tablet, Oral, BID, Santos-Sanchez, Idalys, MD, 2 tablet at 04/10/18 1118 .  sodium chloride flush (NS) 0.9 % injection 10-40 mL, 10-40 mL, Intracatheter, PRN, Annia Belt, MD, 10 mL at 04/10/18 0403  Patients Current Diet:  Diet Order            Diet heart healthy/carb modified Room service appropriate? Yes; Fluid consistency: Thin  Diet effective now              Precautions / Restrictions Precautions Precautions: Fall Restrictions Weight Bearing Restrictions: Yes RLE Weight Bearing: Partial weight bearing   Has the patient had 2 or more falls or a fall with injury in the past year?No  Prior Activity Level Limited Community (1-2x/wk): Went out 2 X a week, used transportation through [employer]  Home Assistive Devices / Equipment Home Assistive Devices/Equipment: Wheelchair Home Equipment: Environmental consultant - 2 wheels, Wheelchair - manual  Prior Device Use: Indicate devices/aids used by the patient prior to current illness, exacerbation or injury? Manual wheelchair, Walker and prosthesis for left lower leg.  Prior Functional Level Prior Function Level of Independence: Independent with assistive device(s) Gait / Transfers Assistance Needed: Uses RW a good bit around the house without prosthetic; recently got prosthetic, but pt  reports too big, so she only wears it in community. Mod indep with w/c mobility. Ascends/descends steps into home without assist. Able to get w/c into and out of car without assist ADL's / Homemaking Assistance Needed: Reports mod indep with ADLs Comments: Mourning loss of mother who passed 04/2017 who was pt's main support system  Self Care: Did the patient need help bathing, dressing, using the toilet or eating?  Independent  Indoor Mobility: Did the patient need assistance with walking from room to room (with or without device)? Independent  Stairs: Did the patient need assistance with internal or external stairs (with or without device)? Independent  Functional Cognition: Did the patient need help planning regular tasks such as shopping or remembering to take medications? Independent  Current Functional Level Cognition  Overall Cognitive Status: Within Functional Limits for tasks assessed Orientation Level: Oriented X4 General Comments: distracted by pain    Extremity Assessment (includes Sensation/Coordination)  Upper Extremity Assessment: Generalized weakness  Lower Extremity Assessment: RLE deficits/detail, LLE deficits/detail RLE Deficits / Details: Pt reports "I cannot feel below my belly" (h/o peripheral neuropathy; light touch in tact to at least knee level); grossly 3/5 throughout RLE Sensation: decreased light touch, history of peripheral neuropathy RLE Coordination: decreased fine motor, decreased gross motor LLE Deficits / Details: L residual limb <3/5; limited by severe pain; pt report numbness as well LLE: Unable to fully assess due to pain LLE Sensation: history of peripheral neuropathy, decreased light touch LLE Coordination: decreased fine motor, decreased gross motor    ADLs  Overall ADL's : Needs assistance/impaired Eating/Feeding: Independent Grooming: Wash/dry  hands, Wash/dry face, Supervision/safety, Set up, Sitting Upper Body Bathing: Supervision/ safety,  Set up, Sitting Lower Body Bathing: Maximal assistance, Bed level Upper Body Dressing : Set up, Minimal assistance, Sitting Lower Body Dressing: Total assistance, Bed level General ADL Comments: PATIENT IS REQUIRING EXTENSIVE ASSIST . PATIENT PERFOMRED ADLS BED LEVEL SECONDARY TO PAIN AND REQUIRING 2 PERSON ASSIT FOR BED MOBILITY AND WITH LIFT FOR TRANSFERS    Mobility  Overal bed mobility: Needs Assistance Bed Mobility: Supine to Sit Supine to sit: Min guard General bed mobility comments: min guard for safety; increased time and effort; use of rails to get into long sitting     Transfers  Overall transfer level: Needs assistance Equipment used: Sliding board, Rolling walker (2 wheeled) Transfer via Lift Equipment: Stedy Transfers: Lateral/Scoot Transfers, Sit to/from Stand Sit to Stand: Total assist, +2 physical assistance, From elevated surface  Lateral/Scoot Transfers: +2 physical assistance, Max assist, With slide board General transfer comment: attempted sit to stand from elevated bed height with RW then Stedy standing frame but unsuccessful; pt able to laterally scoot with use of slide board to drop arm recliner with max A +2 and max cues for anterior translation of trunk and sequencing with R knee blocked; pt has tendency for posterior lean while transfering    Ambulation / Gait / Stairs / Wheelchair Mobility       Posture / Balance Balance Overall balance assessment: Needs assistance Sitting-balance support: Bilateral upper extremity supported, Feet supported Sitting balance-Leahy Scale: Poor Standing balance-Leahy Scale: Zero    Special needs/care consideration BiPAP/CPAP No CPM No Continuous Drip IV Currently receiving a unit of blood 04/10/18 Dialysis No   Life Vest No Oxygen No Special Bed No Trach Size No Wound Vac (area) No    Skin Has an ace wrap to left leg amputation stump                           Bowel mgmt: Last BM11/16/19 Bladder mgmt: Has a purwick  external catheter in place Diabetic mgmt Yes, on insulin at home, but has been non compliant with medical care this past year since her mother's death on 06-13-17    Previous Home Environment Living Arrangements: Other relatives Available Help at Discharge: Family, Available PRN/intermittently Type of Home: House Home Layout: One level Home Access: Stairs to enter Entrance Stairs-Rails: Right, Left Entrance Stairs-Number of Steps: 2 Bathroom Shower/Tub: Tub/shower unit, Architectural technologist: Standard Bathroom Accessibility: Yes Home Care Services: No Additional Comments: Ramp was never built; pt navigates stairs into home.   Discharge Living Setting Plans for Discharge Living Setting: House, Lives with (comment)(Her 62 yo nephew lives with her.) Type of Home at Discharge: House Discharge Home Layout: One level Discharge Home Access: Stairs to enter Entrance Stairs-Number of Steps: 1 at the front entry and 3 at the side entry.  Uses front entry. Discharge Bathroom Shower/Tub: Tub/shower unit, Curtain Discharge Bathroom Toilet: Standard Discharge Bathroom Accessibility: Yes How Accessible: Accessible via walker Does the patient have any problems obtaining your medications?: No  Social/Family/Support Systems Patient Roles: Other (Comment)(Has a sister and a nephew) Contact Information: Firefighter - nephew Anticipated Caregiver: Nephew and Personal care attendents Anticipated Caregiver's Contact Information: Glenda Chroman - nephew - [phone] Ability/Limitations of Caregiver: Alanson Puls is currently looking for a job but is unemployed.  Has a sister.  No children.  Has Personal Care assistant daily for about 2 hours a day Caregiver Availability: Intermittent Discharge Plan Discussed  with Primary Caregiver: Yes Is Caregiver In Agreement with Plan?: Yes Does Caregiver/Family have Issues with Lodging/Transportation while Pt is in Rehab?: No  Goals/Additional Needs Patient/Family  Goal for Rehab: PT mod I and OT mod I and supervision goals Expected length of stay: 14-18 days Cultural Considerations: Apostolic faith Dietary Needs: Heart Healthy, carb modified, thin liquids Equipment Needs: TBD Additional Information: Patient is grieving the loss of her mother from 05/14/17.  Patient has neglected herself since her mother's death.  She was tearful during our discussion today about her mom. Pt/Family Agrees to Admission and willing to participate: Yes Program Orientation Provided & Reviewed with Pt/Caregiver Including Roles  & Responsibilities: Yes  Decrease burden of Care through IP rehab admission: N/A  Possible need for SNF placement upon discharge: Not planned  Patient Condition: This patient's medical and functional status has changed since the consult dated: 04/06/18 in which the Rehabilitation Physician determined and documented that the patient's condition is appropriate for intensive rehabilitative care in an inpatient rehabilitation facility. See "History of Present Illness" (above) for medical update. Functional changes are: Currently requiring max to total assist + 2 for transfers.  She is tolerating up in chair and her pain is better controlled.  Patient's medical and functional status update has been discussed with the Rehabilitation physician and patient remains appropriate for inpatient rehabilitation. Will admit to inpatient rehab today.  Preadmission Screen Completed By:  Retta Diones, 04/10/2018 1:01 PM ______________________________________________________________________   Discussed status with Dr. Naaman Plummer on 04/10/18 at 1259 and received telephone approval for admission today.  Admission Coordinator:  Retta Diones, time 1300/Date 04/10/18

## 2018-04-10 NOTE — Progress Notes (Signed)
IP rehab admissions - I met with patient and gave her booklets about inpatient rehab.  I explained inpatient rehab services.  She had a bad experience in a SNF in the past.  Patient would like CIR prior to home with her nephew.  She has been medically cleared for CIR by attending MD.  Bed available and will admit to CIR today.  Call me for questions.  [phone]

## 2018-04-10 NOTE — Progress Notes (Addendum)
Medicine attending: I examined this patient today together with resident physician Dr Molli Hazard and I concur with her evaluation and management plan. She appears more comfortable today.  No acute change in exam.  Successfully tapered off IV Dilaudid.  Having some intermittent mild muscle jerks of her upper extremities.  Recently put back on her baclofen. Hemoglobin has drifted down to 7 g.  Multifactorial anemia from bone marrow suppression from sepsis and from transient acute on chronic renal insufficiency. We will transfuse 1 unit of blood today.  We discussed this with the patient and she is in agreement.  She has had prior transfusions without problems. Some blood sugars running low.  We can decrease her Lantus insulin. She is otherwise stable for transfer to [facility] inpatient rehab following the transfusion. Impression: 1.  Beta strep sepsis, source cellulitis on skin of BKA stump. 2.  Acute on chronic renal insufficiency 3.  Initial uncontrolled glucose and a type II diabetic on home insulin. 4.  Multifactorial anemia acute illness and renal insufficiency.

## 2018-04-10 NOTE — Progress Notes (Signed)
Pt arrived to unit via bed, pt is a & o and able to make needs known, released orders, pt reports that has not eaten, tray ordered, informed night shift to get CBG prior to tray being admin.  Pt skin is dry, there is very small open area to base of spine, deep tissue injury to right and left coccyx, non blanchable, small abrasions to inner thigh and vagina from medical device use. SR up x 2 with call bell in reach

## 2018-04-10 NOTE — Progress Notes (Signed)
   Subjective:  She states she is feeling well this morning. Denies pain or nausea although she still has a decreased appetite. She has not had a BM in several days.   Objective:  Vital signs in last 24 hours: Vitals:   04/09/18 1931 04/09/18 2115 04/10/18 0511 04/10/18 1000  BP: (!) 151/87 124/84 129/79 138/81  Pulse: 95 93 86 (!) 103  Resp: 20 20    Temp: 97.8 F (36.6 C) 98.3 F (36.8 C)  98.1 F (36.7 C)  TempSrc: Oral Oral  Oral  SpO2: 100% 100% 97% 100%  Weight:      Height:       Physical Exam  Constitution: NAD, supine in bed, obese Cardio: RRR Respiratory: CTAB, no wheezing or rales Neuro: a&o, normal affect Skin: trace pitting edema, chronic stasis changes RLE, PICC site without erythema or swelling   Assessment/Plan:  Principal Problem:   Bacteremia due to group B Streptococcus Active Problems:   Uncontrolled type 2 diabetes mellitus (HCC)   OSA (obstructive sleep apnea)   Chronic kidney disease (CKD), stage III (moderate) (HCC)   Sepsis (HCC)   Status post below knee amputation, left (HCC)   BKA stump complication ([place])   Severe protein-calorie malnutrition ([place])  42yo female with PMH HFpEF, left BKA 03/2017 with 05/2017 revision, recurrent abscesses, TIIDM, OSA, and depression presenting to the ED with sepsis and left stump pain after several days of beginning to use her prosthetic.  Group B Strep Bacteremia PICC in place. Day 6/14 CTX. Left BKA pain controlled with baclofen and percocet. Appears euvolemic and doing well with antibiotic therapy. She will be stable for transfer to CIR today  - cont. CTX - cont. Baclofen and percocet - cont. PT/OT  - reglan prn   AKI on CKD Continuing to improve after IVF yesterday. Encouraged PO fluid intake.   Leukocytosis Improving. UA significant for protein, likely secondary to her previously uncontrolled TIIDM. No bacteria, leuks or nitrites. No signs or symptoms of infection.   Anemia of Chronic  Disease Hemloglobin trending down since admission and is 7 today. Baseline fluctuates per previous admission labs, ~8-10. Discussed with patient and we will transfuse her prior to discharge to CIR  - 1U RBC  - f/u H&H  TIIDM Lantus held last night due to hypoglycemia. Glucose controlled today on TID SSI, likely due to resolution of her bacteremia and decreased po intake 2/2 gastroparesis causing decreased appetite.  - switch SSI from resistant to moderate  - cont. lantus 20U bid - d/c 5U novolog TID   Constipation On senokot 2 tablets bid and miralax qd  - will give mag citrate  VTE: lovenox  IVF: none Diet: heart healthy/carb modified Code: Full  Dispo: Anticipated discharge in approximately today.   [name] [name] A, DO 04/10/2018, 10:33 AM Pager: [phone]

## 2018-04-10 NOTE — H&P (Signed)
Physical Medicine and Rehabilitation Admission H&P        Chief Complaint  Patient presents with  . Debility  . Sepsis due to bacteremia       HPI:   [name] [name] is a 42 y.o with history of T2DM, OSA, CKD, bipolar disorder, morbid obesity--BMI 50, ongoing tobacco use, left BKA--CIR 03/2018 with recent falls and increasing edema and pain in left arm.  She was admitted on 04/03/2018 with fever due to sepsis from group B bacteremia.  She was started on broad-spectrum antibiotics and MRI of left lower extremity done showing generalized soft tissue edema and distal thigh with severe muscle edema and residual gastrocnemius question infectious versus inflammatory myositis.  Dr. Sharol Given felt that there was no need for surgical intervention and antibiotics narrowed to ceftriaxone with recommendations to complete 2 weeks antibiotic course--end date 11/28.  Patient has had issues with nausea with poor appetite, acute on chronic anemia requiring transfusion with 1 units, acute on chronic renal failure treated with IV fluids, poorly controlled blood sugars as well as issues with constipation.  Patient noted to be debilitated and therapy has been ongoing.  CIR recommended for follow-up therapy     Review of Systems  Constitutional: Positive for malaise/fatigue. Negative for fever.  HENT: Negative for hearing loss and tinnitus.   Eyes: Negative for blurred vision and double vision.  Respiratory: Negative for shortness of breath.   Cardiovascular: Positive for leg swelling.  Gastrointestinal: Positive for constipation and heartburn.  Genitourinary: Positive for urgency.  Musculoskeletal: Positive for back pain, falls and myalgias.  Neurological: Positive for sensory change.  Psychiatric/Behavioral: Positive for depression.            Past Medical History:  Diagnosis Date  . Abdominal muscle pain 09/08/2016  . Abnormal Pap smear of cervix 2009  . Abscess      history of multiple  abscesses  . Acute bilateral low back pain 02/13/2017  . Acute blood loss anemia    . Anemia of chronic disease 2002  . Anxiety      Panic attacks  . Bilateral lower extremity edema 05/13/2016  . Bipolar disorder ([place])    . Cellulitis 05/21/2014    right eye  . Chronic bronchitis ([place])      "get it q yr" (05/13/2013)  . Chronic pain    . Depression    . Edema of lower extremity    . Endocarditis 2002    subacute bacterial endocarditis.   . Family history of anesthesia complication      "my mom has a hard time coming out from under"  . Fibromyalgia    . GERD (gastroesophageal reflux disease)      occ  . Heart murmur    . History of blood transfusion      "just low blood count" (05/13/2013)  . Hyperlipidemia    . Hypertension    . Hypothyroidism    . Hypothyroidism, adult 03/21/2014  . Leukocytosis    . Necrosis ([place])      and ulceration  . Obesity    . OSA on CPAP      does not wear CPAP  . Peripheral neuropathy    . Type II diabetes mellitus ([place])      Type  II           Past Surgical History:  Procedure Laterality Date  . AMPUTATION Left 04/07/2017    Procedure: LEFT BELOW KNEE AMPUTATION;  Surgeon:  Newt Minion, MD;  Location: [facility];  Service: Orthopedics;  Laterality: Left;  . EYE SURGERY        [place]  . INCISION AND DRAINAGE ABSCESS        multiple I&Ds  . INCISION AND DRAINAGE ABSCESS Left 07/09/2012    Procedure: DRESSING CHANGE, THIGH WOUND;  Surgeon: Harl Bowie, MD;  Location: [facility];  Service: General;  Laterality: Left;  . INCISION AND DRAINAGE OF WOUND Left 07/07/2012    Procedure: IRRIGATION AND DEBRIDEMENT WOUND;  Surgeon: Harl Bowie, MD;  Location: [facility];  Service: General;  Laterality: Left;  . INCISION AND DRAINAGE PERIRECTAL ABSCESS Left 07/14/2012    Procedure: DEBRIDEMENT OF SKIN & SOFT TISSUE; DRESSING CHANGE UNDER ANESTHESIA;  Surgeon: Gayland Curry, MD,FACS;  Location: [place] [facility] [place];  Service: General;  Laterality: Left;  . INCISION  AND DRAINAGE PERIRECTAL ABSCESS Left 07/16/2012    Procedure: I&D Left Thigh;  Surgeon: Gwenyth Ober, MD;  Location: [facility];  Service: General;  Laterality: Left;  . INCISION AND DRAINAGE PERIRECTAL ABSCESS N/A 01/05/2015    Procedure: IRRIGATION AND DEBRIDEMENT PERIRECTAL ABSCESS;  Surgeon: Donnie Mesa, MD;  Location: [facility];  Service: General;  Laterality: N/A;  . IRRIGATION AND DEBRIDEMENT ABSCESS Left 07/06/2012    Procedure: IRRIGATION AND DEBRIDEMENT ABSCESS BUTTOCKS AND THIGH;  Surgeon: Shann Medal, MD;  Location: [facility];  Service: General;  Laterality: Left;  . IRRIGATION AND DEBRIDEMENT ABSCESS Left 08/10/2012    Procedure: IRRIGATION AND DEBRIDEMENT ABSCESS;  Surgeon: Madilyn Hook, DO;  Location: [facility];  Service: General;  Laterality: Left;  . STUMP REVISION Left 06/09/2017    Procedure: REVISION LEFT BELOW KNEE AMPUTATION;  Surgeon: Newt Minion, MD;  Location: [facility];  Service: Orthopedics;  Laterality: Left;  . TONSILLECTOMY   1994           Family History  Problem Relation Age of Onset  . Heart failure Mother    . Diabetes Mother    . Kidney disease Mother    . Kidney disease Father    . Diabetes Father    . Diabetes Paternal Grandmother    . Heart failure Paternal Grandmother    . Other Other        Social History:  Nephew lives with her.  Wheelchair-bound and able to ambulate short distances?  Has an aide for 2 hrs/7 days a week.  Reports that she has been smoking cigarettes--down to a 1PPD. She has a 16.00 pack-year smoking history. She has never used smokeless tobacco. She reports that she drinks alcohol. She reports that she does not use drugs.           Allergies  Allergen Reactions  . Vancomycin Other (See Comments)      Acute renal failure suspected secondary to vanco      Medications Prior to Admission  Medication Sig Dispense Refill  . amitriptyline (ELAVIL) 50 MG tablet TAKE 1 TABLET BY MOUTH AT BEDTIME (Patient taking differently: Take 50 mg by mouth at  bedtime. ) 90 tablet 3  . atorvastatin (LIPITOR) 20 MG tablet TAKE 1 TABLET BY MOUTH EVERY DAY (Patient taking differently: Take 20 mg by mouth daily. ) 90 tablet 1  . bumetanide (BUMEX) 1 MG tablet Take 1 tablet (1 mg total) by mouth 2 (two) times daily. 180 tablet 3  . cyclobenzaprine (FLEXERIL) 10 MG tablet TAKE 1 TABLET BY MOUTH EVERY TWELVE HOURS AS NEEDED for MUSCLE SPASMS (Patient taking  differently: Take 10 mg by mouth 3 (three) times daily as needed for muscle spasms. ) 30 tablet 0  . diclofenac sodium (VOLTAREN) 1 % GEL Apply 4 grams to affected area 4 times daily as needed for pain (Patient taking differently: Apply 4 g topically 4 (four) times daily as needed (pain). ) 100 g 2  . docusate sodium (COLACE) 100 MG capsule Take 1 capsule (100 mg total) by mouth 2 (two) times daily. (Patient taking differently: Take 100 mg by mouth 2 (two) times daily as needed for moderate constipation. ) 10 capsule 0  . FLUoxetine (PROZAC) 20 MG/5ML solution Take 15 mLs (60 mg total) by mouth daily. 480 mL 1  . ibuprofen (ADVIL) 200 MG tablet Take 3 tablets (600 mg total) by mouth every 8 (eight) hours as needed for moderate pain (Take with food and water). 100 tablet 2  . insulin aspart (NOVOLOG FLEXPEN) 100 UNIT/ML FlexPen Inject 13 Units into the skin 3 (three) times daily with meals. (Patient taking differently: Inject 15 Units into the skin 3 (three) times daily with meals. ) 15 mL 1  . insulin glargine (LANTUS) 100 unit/mL SOPN Inject 0.5 mLs (50 Units total) into the skin 2 (two) times daily. (Patient taking differently: Inject 40 Units into the skin 2 (two) times daily. ) 15 mL 11  . LYRICA 100 MG capsule TAKE 1 CAPSULE BY MOUTH 3 TIMES DAILY (Patient taking differently: Take 100 mg by mouth 3 (three) times daily. ) 90 capsule 2  . mupirocin ointment (BACTROBAN) 2 % Apply 1 application topically 2 (two) times daily. (Patient taking differently: Apply 1 application topically 2 (two) times daily as needed  (skin care). ) 22 g 6  . nystatin (MYCOSTATIN/NYSTOP) powder Apply topically 4 (four) times daily. After cleaning and drying the affected area for two weeks (Patient taking differently: Apply 1 g topically 4 (four) times daily as needed (skin care). After cleaning and drying the affected area for two weeks) 30 g 1  . oxyCODONE-acetaminophen (PERCOCET/ROXICET) 5-325 MG tablet Take 1 tablet by mouth every 6 (six) hours as needed for severe pain. 30 tablet 0  . polyethylene glycol (MIRALAX / GLYCOLAX) packet Take 17 g by mouth daily. (Patient taking differently: Take 17 g by mouth daily as needed for moderate constipation. ) 14 each 0  . ULTICARE INSULIN SYRINGE 31G X 1/4" 0.5 ML MISC Use to inject insulin as directed 100 each 2  . VICTOZA 18 MG/3ML SOPN Inject 0.3 mLs (1.8 mg total) into the skin daily. 9 mL 2  . fluconazole (DIFLUCAN) 150 MG tablet Please take one tablet every three days (72 hours apart) until completed (Patient not taking: Reported on 04/03/2018) 3 tablet 0      Drug Regimen Review  Drug regimen was reviewed and remains appropriate with no significant issues identified   Home: Home Living Family/patient expects to be discharged to:: Private residence Living Arrangements: Other relatives Available Help at Discharge: Family, Available PRN/intermittently Type of Home: House Home Access: Stairs to enter [employer] of Steps: 2 Entrance Stairs-Rails: Right, Left Home Layout: One level Bathroom Shower/Tub: Tub/shower unit, Architectural technologist: Standard Bathroom Accessibility: Yes Home Equipment: Environmental consultant - 2 wheels, Wheelchair - manual Additional Comments: Ramp was never built; pt navigates stairs into home.    Functional History: Prior Function Level of Independence: Independent with assistive device(s) Gait / Transfers Assistance Needed: Uses RW a good bit around the house without prosthetic; recently got prosthetic, but pt reports too big,  so she only wears  it in community. Mod indep with w/c mobility. Ascends/descends steps into home without assist. Able to get w/c into and out of car without assist ADL's / Homemaking Assistance Needed: Reports mod indep with ADLs Comments: Mourning loss of mother who passed 04/2017 who was pt's main support system   Functional Status:  Mobility: Bed Mobility Overal bed mobility: Needs Assistance Bed Mobility: Supine to Sit Supine to sit: Min guard General bed mobility comments: min guard for safety; increased time and effort; use of rails to get into long sitting  Transfers Overall transfer level: Needs assistance Equipment used: Sliding board, Rolling walker (2 wheeled) Transfer via Lift Equipment: Stedy Transfers: Lateral/Scoot Transfers, Sit to/from Stand Sit to Stand: Total assist, +2 physical assistance, From elevated surface  Lateral/Scoot Transfers: +2 physical assistance, Max assist, With slide board General transfer comment: attempted sit to stand from elevated bed height with RW then Stedy standing frame but unsuccessful; pt able to laterally scoot with use of slide board to drop arm recliner with max A +2 and max cues for anterior translation of trunk and sequencing with R knee blocked; pt has tendency for posterior lean while transfering   ADL: ADL Overall ADL's : Needs assistance/impaired Eating/Feeding: Independent Grooming: Wash/dry hands, Wash/dry face, Supervision/safety, Set up, Sitting Upper Body Bathing: Supervision/ safety, Set up, Sitting Lower Body Bathing: Maximal assistance, Bed level Upper Body Dressing : Set up, Minimal assistance, Sitting Lower Body Dressing: Total assistance, Bed level General ADL Comments: PATIENT IS REQUIRING EXTENSIVE ASSIST . PATIENT PERFOMRED ADLS BED LEVEL SECONDARY TO PAIN AND REQUIRING 2 PERSON ASSIT FOR BED MOBILITY AND WITH LIFT FOR TRANSFERS   Cognition: Cognition Overall Cognitive Status: Within Functional Limits for tasks assessed Orientation  Level: Oriented X4 Cognition Arousal/Alertness: Awake/alert Behavior During Therapy: WFL for tasks assessed/performed Overall Cognitive Status: Within Functional Limits for tasks assessed General Comments: distracted by pain     Blood pressure (!) 148/83, pulse (!) 103, temperature 97.9 F (36.6 C), temperature source Oral, resp. rate 20, height '5\' 10"'  (1.778 m), weight (!) 153.8 kg, last menstrual period 02/19/2018, SpO2 98 %. Physical Exam  Nursing note and vitals reviewed. Constitutional: She is oriented to person, place, and time. She appears well-developed and well-nourished. No distress.  Morbidly obese female in NAD. More alert and interactive today.   HENT:  Head: Normocephalic and atraumatic.  Eyes: Pupils are equal, round, and reactive to light. EOM are normal.  Neck: Normal range of motion.  Cardiovascular: Normal rate and regular rhythm. Exam reveals no friction rub.  Respiratory: Effort normal. No respiratory distress. She has no wheezes.  GI: Soft. Bowel sounds are normal.  Musculoskeletal: She exhibits edema.  Ace wrap on tip of stump. Still with 2+-3+ edema left thigh over ace wrap. Left leg tender to palpation and with AROM/PROM. Right foot with neuropathic ulcer under great toe--black edges with top layers sloughed off callus. .   Neurological: She is alert and oriented to person, place, and time. No cranial nerve deficit.  UE 5/5. LLE limited by pain. RLE 3-4/5 prox to distal. Decreased LT distally. Cognitively intact  Skin: She is not diaphoretic.  Psychiatric:  Pleasant and appropriate      Lab Results Last 48 Hours        Results for orders placed or performed during the hospital encounter of 04/03/18 (from the past 48 hour(s))  Glucose, capillary     Status: Abnormal    Collection Time: 04/08/18  4:11 PM  Result Value Ref Range    Glucose-Capillary 106 (H) 70 - 99 mg/dL  Glucose, capillary     Status: Abnormal    Collection Time: 04/08/18 10:10 PM  Result  Value Ref Range    Glucose-Capillary 108 (H) 70 - 99 mg/dL  Basic metabolic panel     Status: Abnormal    Collection Time: 04/09/18  3:43 AM  Result Value Ref Range    Sodium 134 (L) 135 - 145 mmol/L    Potassium 4.4 3.5 - 5.1 mmol/L    Chloride 108 98 - 111 mmol/L    CO2 20 (L) 22 - 32 mmol/L    Glucose, Bld 95 70 - 99 mg/dL    BUN 39 (H) 6 - 20 mg/dL    Creatinine, Ser 1.76 (H) 0.44 - 1.00 mg/dL    Calcium 8.2 (L) 8.9 - 10.3 mg/dL    GFR calc non Af Amer 35 (L) >60 mL/min    GFR calc Af Amer 40 (L) >60 mL/min      Comment: (NOTE) The eGFR has been calculated using the CKD EPI equation. This calculation has not been validated in all clinical situations. eGFR's persistently <60 mL/min signify possible Chronic Kidney Disease.      Anion gap 6 5 - 15      Comment: Performed at [facility] [address], [place], [place] [address]  CBC     Status: Abnormal    Collection Time: 04/09/18  3:43 AM  Result Value Ref Range    WBC 11.8 (H) 4.0 - 10.5 K/uL    RBC 2.98 (L) 3.87 - 5.11 MIL/uL    Hemoglobin 7.1 (L) 12.0 - 15.0 g/dL    HCT 24.4 (L) 36.0 - 46.0 %    MCV 81.9 80.0 - 100.0 fL    MCH 23.8 (L) 26.0 - 34.0 pg    MCHC 29.1 (L) 30.0 - 36.0 g/dL    RDW 15.8 (H) 11.5 - 15.5 %    Platelets 367 150 - 400 K/uL    nRBC 0.4 (H) 0.0 - 0.2 %      Comment: Performed at [facility], [place] [address] [place], [place] [address]  Ferritin     Status: None    Collection Time: 04/09/18  3:43 AM  Result Value Ref Range    Ferritin 203 11 - 307 ng/mL      Comment: Performed at [facility], [place] [address] [place], [place] [address]  Iron and TIBC     Status: Abnormal    Collection Time: 04/09/18  3:43 AM  Result Value Ref Range    Iron 9 (L) 28 - 170 ug/dL    TIBC NOT CALCULATED 250 - 450 ug/dL    Saturation Ratios NOT CALCULATED 10.4 - 31.8 %    UIBC NOT CALCULATED ug/dL      Comment: Performed at [facility] [address], [place], [place] [address]    Glucose, capillary     Status: Abnormal    Collection Time: 04/09/18  6:14 AM  Result Value Ref Range    Glucose-Capillary 116 (H) 70 - 99 mg/dL  Urinalysis, Routine w reflex microscopic     Status: Abnormal    Collection Time: 04/09/18  9:07 AM  Result Value Ref Range    Color, Urine YELLOW YELLOW    APPearance HAZY (A) CLEAR    Specific Gravity, Urine 1.006 1.005 - 1.030    pH 6.0 5.0 -  8.0    Glucose, UA 50 (A) NEGATIVE mg/dL    Hgb urine dipstick MODERATE (A) NEGATIVE    Bilirubin Urine NEGATIVE NEGATIVE    Ketones, ur NEGATIVE NEGATIVE mg/dL    Protein, ur 100 (A) NEGATIVE mg/dL    Nitrite NEGATIVE NEGATIVE    Leukocytes, UA NEGATIVE NEGATIVE    RBC / HPF 0-5 0 - 5 RBC/hpf    WBC, UA 0-5 0 - 5 WBC/hpf    Bacteria, UA RARE (A) NONE SEEN    Mucus PRESENT      Amorphous Crystal PRESENT        Comment: Performed at [facility] [address] [place], [place] [address]  Glucose, capillary     Status: Abnormal    Collection Time: 04/09/18 11:30 AM  Result Value Ref Range    Glucose-Capillary 105 (H) 70 - 99 mg/dL  Glucose, capillary     Status: Abnormal    Collection Time: 04/09/18  4:58 PM  Result Value Ref Range    Glucose-Capillary 156 (H) 70 - 99 mg/dL  Glucose, capillary     Status: Abnormal    Collection Time: 04/09/18  9:58 PM  Result Value Ref Range    Glucose-Capillary 66 (L) 70 - 99 mg/dL  CBC     Status: Abnormal    Collection Time: 04/10/18  3:56 AM  Result Value Ref Range    WBC 10.7 (H) 4.0 - 10.5 K/uL    RBC 2.85 (L) 3.87 - 5.11 MIL/uL    Hemoglobin 7.0 (L) 12.0 - 15.0 g/dL    HCT 23.8 (L) 36.0 - 46.0 %    MCV 83.5 80.0 - 100.0 fL    MCH 24.6 (L) 26.0 - 34.0 pg    MCHC 29.4 (L) 30.0 - 36.0 g/dL    RDW 15.6 (H) 11.5 - 15.5 %    Platelets 343 150 - 400 K/uL    nRBC 0.5 (H) 0.0 - 0.2 %      Comment: Performed at [facility], [place] [address] [place], [place] [address]  Basic metabolic panel     Status: Abnormal    Collection Time:  04/10/18  3:56 AM  Result Value Ref Range    Sodium 137 135 - 145 mmol/L    Potassium 4.5 3.5 - 5.1 mmol/L    Chloride 109 98 - 111 mmol/L    CO2 23 22 - 32 mmol/L    Glucose, Bld 153 (H) 70 - 99 mg/dL    BUN 34 (H) 6 - 20 mg/dL    Creatinine, Ser 1.50 (H) 0.44 - 1.00 mg/dL    Calcium 8.3 (L) 8.9 - 10.3 mg/dL    GFR calc non Af Amer 42 (L) >60 mL/min    GFR calc Af Amer 49 (L) >60 mL/min      Comment: (NOTE) The eGFR has been calculated using the CKD EPI equation. This calculation has not been validated in all clinical situations. eGFR's persistently <60 mL/min signify possible Chronic Kidney Disease.      Anion gap 5 5 - 15      Comment: Performed at [facility] [address], [place], [place] [address]  Type and screen [place]     Status: None (Preliminary result)    Collection Time: 04/10/18  8:00 AM  Result Value Ref Range    ABO/RH(D) B POS      Antibody Screen NEG      Sample Expiration  04/13/2018      Unit Number [id number]      Blood Component Type RBC LR PHER1      Unit division 00      Status of Unit ISSUED      Transfusion Status OK TO TRANSFUSE      Crossmatch Result          Compatible Performed at [facility], [place] [address] [place], [place] [address]    Prepare RBC     Status: None    Collection Time: 04/10/18  8:00 AM  Result Value Ref Range    Order Confirmation          ORDER PROCESSED BY BLOOD BANK Performed at [facility], [place] [address] [place], [place] [address]    Glucose, capillary     Status: Abnormal    Collection Time: 04/10/18  8:36 AM  Result Value Ref Range    Glucose-Capillary 131 (H) 70 - 99 mg/dL  Glucose, capillary     Status: Abnormal    Collection Time: 04/10/18 12:29 PM  Result Value Ref Range    Glucose-Capillary 118 (H) 70 - 99 mg/dL      Imaging Results (Last 48 hours)  No results found.           Medical Problem List and Plan: 1.  Functional and mobility deficits  secondary to left lower extremity myositis/strep bacteremia             -admit to inpatient rehab 2.  DVT Prophylaxis/Anticoagulation: Pharmaceutical: Lovenox 3.  Chronic back pain/ Management: Continue oxycodone as needed 4. Mood: LCSW to follow for evaluation and support.  Ego support to be provided by team 5. Neuropsych: This patient is capable of making decisions on her own behalf. 6. Skin/Wound Care: Routine pressure relief measures.  Encourage boosting 7. Fluids/Electrolytes/Nutrition: Monitor I's and O's. 8.  Group B strep agalactiae bacteremia: Continue IV antibiotics with end date 11/28 9.  T2DM with neuropathy: We will monitor blood sugars AC at bedtime.  Continue to titrate Lantus twice daily and for tighter control. 10.  Chronic constipation: Finally had BM today after mag citrate. Will continue to augment back bowel program 11.  HTN: Monitor blood pressures twice daily. Off Bumex at this time.  12.  Peripheral edema: Monitor weights daily.             -compression/elevation of LE's 13.  Bipolar disorder with depression: Continue Prozac 14.  Acute on chronic renal failure: SCr 2.72 at admission---> down to 1.5 with hydration and medication adjustment. Avoid nephrotoxic medications.  15.  Anemia of chronic disease/iron deficiency:  H&H down to 7.0 23.8 today.  Complete transfusion. Monitor stool guaiacs. Add iron supplement.  16. Morbid obesiy-BMI-48.6: Educate patient on appropriate diet and importance of activity to help promote health and mobility.         Post Admission Physician Evaluation: 1. Functional deficits secondary  to debility related to multiple medical, hx of left BKA. 2. Patient is admitted to receive collaborative, interdisciplinary care between the physiatrist, rehab nursing staff, and therapy team. 3. Patient's level of medical complexity and substantial therapy needs in context of that medical necessity cannot be provided at a lesser intensity of care such as a  SNF. 4. Patient has experienced substantial functional loss from his/her baseline which was documented above under the "Functional History" and "Functional Status" headings.  Judging by the patient's diagnosis, physical exam, and functional history, the patient has potential for  functional progress which will result in measurable gains while on inpatient rehab.  These gains will be of substantial and practical use upon discharge  in facilitating mobility and self-care at the household level. 5. Physiatrist will provide 24 hour management of medical needs as well as oversight of the therapy plan/treatment and provide guidance as appropriate regarding the interaction of the two. 6. The Preadmission Screening has been reviewed and patient status is unchanged unless otherwise stated above. 7. 24 hour rehab nursing will assist with bladder management, bowel management, safety, skin/wound care, disease management, medication administration, pain management and patient education  and help integrate therapy concepts, techniques,education, etc. 8. PT will assess and treat for/with: Lower extremity strength, range of motion, stamina, balance, functional mobility, safety, adaptive techniques and equipment, NMR, pain mgt, pre-prosthetic ed, ego support, community reentry.   Goals are: mod I at w/c level. 9. OT will assess and treat for/with: ADL's, functional mobility, safety, upper extremity strength, adaptive techniques and equipment, NMR, pain mgt, ego support, community reentry.   Goals are: mod I to supervision. Therapy may proceed with showering this patient. 10. SLP will assess and treat for/with: n/a.  Goals are: n/a. 11. Case Management and Social Worker will assess and treat for psychological issues and discharge planning. 12. Team conference will be held weekly to assess progress toward goals and to determine barriers to discharge. 13. Patient will receive at least 3 hours of therapy per day at least 5 days  per week. 14. ELOS: 14-18 days       15. Prognosis:  excellent     I have personally performed a face to face diagnostic evaluation of this patient and formulated the key components of the plan.  Additionally, I have personally reviewed laboratory data, imaging studies, as well as relevant notes and concur with the physician assistant's documentation above.   Meredith Staggers, MD, Mellody Drown     Bary Leriche, PA-C 04/10/2018  The patient's status has not changed. The original post admission physician evaluation remains appropriate, and any changes from the pre-admission screening or documentation from the acute chart are noted above.  Meredith Staggers, MD 04/10/2018

## 2018-04-10 NOTE — H&P (Signed)
Physical Medicine and Rehabilitation Admission H&P    Chief Complaint  Patient presents with  . Debility  . Sepsis due to bacteremia     HPI:   [name] [name] is a 42 y.o with history of T2DM, OSA, CKD, bipolar disorder, morbid obesity--BMI 50, ongoing tobacco use, left BKA--CIR 03/2018 with recent falls and increasing edema and pain in left arm.  She was admitted on 04/03/2018 with fever due to sepsis from group B bacteremia.  She was started on broad-spectrum antibiotics and MRI of left lower extremity done showing generalized soft tissue edema and distal thigh with severe muscle edema and residual gastrocnemius question infectious versus inflammatory myositis.  Dr. Sharol Given felt that there was no need for surgical intervention and antibiotics narrowed to ceftriaxone with recommendations to complete 2 weeks antibiotic course--end date 11/28.  Patient has had issues with nausea with poor appetite, acute on chronic anemia requiring transfusion with 1 units, acute on chronic renal failure treated with IV fluids, poorly controlled blood sugars as well as issues with constipation.  Patient noted to be debilitated and therapy has been ongoing.  CIR recommended for follow-up therapy   Review of Systems  Constitutional: Positive for malaise/fatigue. Negative for fever.  HENT: Negative for hearing loss and tinnitus.   Eyes: Negative for blurred vision and double vision.  Respiratory: Negative for shortness of breath.   Cardiovascular: Positive for leg swelling.  Gastrointestinal: Positive for constipation and heartburn.  Genitourinary: Positive for urgency.  Musculoskeletal: Positive for back pain, falls and myalgias.  Neurological: Positive for sensory change.  Psychiatric/Behavioral: Positive for depression.      Past Medical History:  Diagnosis Date  . Abdominal muscle pain 09/08/2016  . Abnormal Pap smear of cervix 2009  . Abscess    history of multiple abscesses  . Acute  bilateral low back pain 02/13/2017  . Acute blood loss anemia   . Anemia of chronic disease 2002  . Anxiety    Panic attacks  . Bilateral lower extremity edema 05/13/2016  . Bipolar disorder ([place])   . Cellulitis 05/21/2014   right eye  . Chronic bronchitis ([place])    "get it q yr" (05/13/2013)  . Chronic pain   . Depression   . Edema of lower extremity   . Endocarditis 2002   subacute bacterial endocarditis.   . Family history of anesthesia complication    "my mom has a hard time coming out from under"  . Fibromyalgia   . GERD (gastroesophageal reflux disease)    occ  . Heart murmur   . History of blood transfusion    "just low blood count" (05/13/2013)  . Hyperlipidemia   . Hypertension   . Hypothyroidism   . Hypothyroidism, adult 03/21/2014  . Leukocytosis   . Necrosis ([place])    and ulceration  . Obesity   . OSA on CPAP    does not wear CPAP  . Peripheral neuropathy   . Type II diabetes mellitus ([place])    Type  II    Past Surgical History:  Procedure Laterality Date  . AMPUTATION Left 04/07/2017   Procedure: LEFT BELOW KNEE AMPUTATION;  Surgeon: Newt Minion, MD;  Location: [facility];  Service: Orthopedics;  Laterality: Left;  . EYE SURGERY     [facility]  . INCISION AND DRAINAGE ABSCESS     multiple I&Ds  . INCISION AND DRAINAGE ABSCESS Left 07/09/2012   Procedure: DRESSING CHANGE, THIGH WOUND;  Surgeon: Harl Bowie, MD;  Location[facility];  Service: General;  Laterality: Left;  . INCISION AND DRAINAGE OF WOUND Left 07/07/2012   Procedure: IRRIGATION AND DEBRIDEMENT WOUND;  Surgeon: Harl Bowie, MD;  Location: [facility];  Service: General;  Laterality: Left;  . INCISION AND DRAINAGE PERIRECTAL ABSCESS Left 07/14/2012   Procedure: DEBRIDEMENT OF SKIN & SOFT TISSUE; DRESSING CHANGE UNDER ANESTHESIA;  Surgeon: Gayland Curry, MD,FACS;  Location: [facility];  Service: General;  Laterality: Left;  . INCISION AND DRAINAGE PERIRECTAL ABSCESS Left 07/16/2012   Procedure: I&D Left  Thigh;  Surgeon: Gwenyth Ober, MD;  Location: [facility];  Service: General;  Laterality: Left;  . INCISION AND DRAINAGE PERIRECTAL ABSCESS N/A 01/05/2015   Procedure: IRRIGATION AND DEBRIDEMENT PERIRECTAL ABSCESS;  Surgeon: Donnie Mesa, MD;  Location: [facility];  Service: General;  Laterality: N/A;  . IRRIGATION AND DEBRIDEMENT ABSCESS Left 07/06/2012   Procedure: IRRIGATION AND DEBRIDEMENT ABSCESS BUTTOCKS AND THIGH;  Surgeon: Shann Medal, MD;  Location: [facility];  Service: General;  Laterality: Left;  . IRRIGATION AND DEBRIDEMENT ABSCESS Left 08/10/2012   Procedure: IRRIGATION AND DEBRIDEMENT ABSCESS;  Surgeon: Madilyn Hook, DO;  Location: [facility];  Service: General;  Laterality: Left;  . STUMP REVISION Left 06/09/2017   Procedure: REVISION LEFT BELOW KNEE AMPUTATION;  Surgeon: Newt Minion, MD;  Location: [facility];  Service: Orthopedics;  Laterality: Left;  . TONSILLECTOMY  1994    Family History  Problem Relation Age of Onset  . Heart failure Mother   . Diabetes Mother   . Kidney disease Mother   . Kidney disease Father   . Diabetes Father   . Diabetes Paternal Grandmother   . Heart failure Paternal Grandmother   . Other Other     Social History:  Nephew lives with her.  Wheelchair-bound and able to ambulate short distances?  Has an aide for 2 hrs/7 days a week.  Reports that she has been smoking cigarettes--down to a 1PPD. She has a 16.00 pack-year smoking history. She has never used smokeless tobacco. She reports that she drinks alcohol. She reports that she does not use drugs.    Allergies  Allergen Reactions  . Vancomycin Other (See Comments)    Acute renal failure suspected secondary to vanco    Medications Prior to Admission  Medication Sig Dispense Refill  . amitriptyline (ELAVIL) 50 MG tablet TAKE 1 TABLET BY MOUTH AT BEDTIME (Patient taking differently: Take 50 mg by mouth at bedtime. ) 90 tablet 3  . atorvastatin (LIPITOR) 20 MG tablet TAKE 1 TABLET BY MOUTH EVERY DAY (Patient  taking differently: Take 20 mg by mouth daily. ) 90 tablet 1  . bumetanide (BUMEX) 1 MG tablet Take 1 tablet (1 mg total) by mouth 2 (two) times daily. 180 tablet 3  . cyclobenzaprine (FLEXERIL) 10 MG tablet TAKE 1 TABLET BY MOUTH EVERY TWELVE HOURS AS NEEDED for MUSCLE SPASMS (Patient taking differently: Take 10 mg by mouth 3 (three) times daily as needed for muscle spasms. ) 30 tablet 0  . diclofenac sodium (VOLTAREN) 1 % GEL Apply 4 grams to affected area 4 times daily as needed for pain (Patient taking differently: Apply 4 g topically 4 (four) times daily as needed (pain). ) 100 g 2  . docusate sodium (COLACE) 100 MG capsule Take 1 capsule (100 mg total) by mouth 2 (two) times daily. (Patient taking differently: Take 100 mg by mouth 2 (two) times daily as needed for moderate constipation. ) 10 capsule 0  .  FLUoxetine (PROZAC) 20 MG/5ML solution Take 15 mLs (60 mg total) by mouth daily. 480 mL 1  . ibuprofen (ADVIL) 200 MG tablet Take 3 tablets (600 mg total) by mouth every 8 (eight) hours as needed for moderate pain (Take with food and water). 100 tablet 2  . insulin aspart (NOVOLOG FLEXPEN) 100 UNIT/ML FlexPen Inject 13 Units into the skin 3 (three) times daily with meals. (Patient taking differently: Inject 15 Units into the skin 3 (three) times daily with meals. ) 15 mL 1  . insulin glargine (LANTUS) 100 unit/mL SOPN Inject 0.5 mLs (50 Units total) into the skin 2 (two) times daily. (Patient taking differently: Inject 40 Units into the skin 2 (two) times daily. ) 15 mL 11  . LYRICA 100 MG capsule TAKE 1 CAPSULE BY MOUTH 3 TIMES DAILY (Patient taking differently: Take 100 mg by mouth 3 (three) times daily. ) 90 capsule 2  . mupirocin ointment (BACTROBAN) 2 % Apply 1 application topically 2 (two) times daily. (Patient taking differently: Apply 1 application topically 2 (two) times daily as needed (skin care). ) 22 g 6  . nystatin (MYCOSTATIN/NYSTOP) powder Apply topically 4 (four) times daily. After  cleaning and drying the affected area for two weeks (Patient taking differently: Apply 1 g topically 4 (four) times daily as needed (skin care). After cleaning and drying the affected area for two weeks) 30 g 1  . oxyCODONE-acetaminophen (PERCOCET/ROXICET) 5-325 MG tablet Take 1 tablet by mouth every 6 (six) hours as needed for severe pain. 30 tablet 0  . polyethylene glycol (MIRALAX / GLYCOLAX) packet Take 17 g by mouth daily. (Patient taking differently: Take 17 g by mouth daily as needed for moderate constipation. ) 14 each 0  . ULTICARE INSULIN SYRINGE 31G X 1/4" 0.5 ML MISC Use to inject insulin as directed 100 each 2  . VICTOZA 18 MG/3ML SOPN Inject 0.3 mLs (1.8 mg total) into the skin daily. 9 mL 2  . fluconazole (DIFLUCAN) 150 MG tablet Please take one tablet every three days (72 hours apart) until completed (Patient not taking: Reported on 04/03/2018) 3 tablet 0    Drug Regimen Review  Drug regimen was reviewed and remains appropriate with no significant issues identified  Home: Home Living Family/patient expects to be discharged to:: Private residence Living Arrangements: Other relatives Available Help at Discharge: Family, Available PRN/intermittently Type of Home: House Home Access: Stairs to enter [employer] of Steps: 2 Entrance Stairs-Rails: Right, Left Home Layout: One level Bathroom Shower/Tub: Tub/shower unit, Architectural technologist: Standard Bathroom Accessibility: Yes Home Equipment: Environmental consultant - 2 wheels, Wheelchair - manual Additional Comments: Ramp was never built; pt navigates stairs into home.    Functional History: Prior Function Level of Independence: Independent with assistive device(s) Gait / Transfers Assistance Needed: Uses RW a good bit around the house without prosthetic; recently got prosthetic, but pt reports too big, so she only wears it in community. Mod indep with w/c mobility. Ascends/descends steps into home without assist. Able to get w/c  into and out of car without assist ADL's / Homemaking Assistance Needed: Reports mod indep with ADLs Comments: Mourning loss of mother who passed 04/2017 who was pt's main support system  Functional Status:  Mobility: Bed Mobility Overal bed mobility: Needs Assistance Bed Mobility: Supine to Sit Supine to sit: Min guard General bed mobility comments: min guard for safety; increased time and effort; use of rails to get into long sitting  Transfers Overall transfer level: Needs assistance Equipment  used: Sliding board, Rolling walker (2 wheeled) Transfer via Lift Equipment: Stedy Transfers: Transport planner, Sit to/from Stand Sit to Stand: Total assist, +2 physical assistance, From elevated surface  Lateral/Scoot Transfers: +2 physical assistance, Max assist, With slide board General transfer comment: attempted sit to stand from elevated bed height with RW then Stedy standing frame but unsuccessful; pt able to laterally scoot with use of slide board to drop arm recliner with max A +2 and max cues for anterior translation of trunk and sequencing with R knee blocked; pt has tendency for posterior lean while transfering      ADL: ADL Overall ADL's : Needs assistance/impaired Eating/Feeding: Independent Grooming: Wash/dry hands, Wash/dry face, Supervision/safety, Set up, Sitting Upper Body Bathing: Supervision/ safety, Set up, Sitting Lower Body Bathing: Maximal assistance, Bed level Upper Body Dressing : Set up, Minimal assistance, Sitting Lower Body Dressing: Total assistance, Bed level General ADL Comments: PATIENT IS REQUIRING EXTENSIVE ASSIST . PATIENT PERFOMRED ADLS BED LEVEL SECONDARY TO PAIN AND REQUIRING 2 PERSON ASSIT FOR BED MOBILITY AND WITH LIFT FOR TRANSFERS  Cognition: Cognition Overall Cognitive Status: Within Functional Limits for tasks assessed Orientation Level: Oriented X4 Cognition Arousal/Alertness: Awake/alert Behavior During Therapy: WFL for tasks  assessed/performed Overall Cognitive Status: Within Functional Limits for tasks assessed General Comments: distracted by pain   Blood pressure (!) 148/83, pulse (!) 103, temperature 97.9 F (36.6 C), temperature source Oral, resp. rate 20, height '5\' 10"'  (1.778 m), weight (!) 153.8 kg, last menstrual period 02/19/2018, SpO2 98 %. Physical Exam  Nursing note and vitals reviewed. Constitutional: She is oriented to person, place, and time. She appears well-developed and well-nourished. No distress.  Morbidly obese female in NAD. More alert and interactive today.   HENT:  Head: Normocephalic and atraumatic.  Eyes: Pupils are equal, round, and reactive to light. EOM are normal.  Neck: Normal range of motion.  Cardiovascular: Normal rate and regular rhythm. Exam reveals no friction rub.  Respiratory: Effort normal. No respiratory distress. She has no wheezes.  GI: Soft. Bowel sounds are normal.  Musculoskeletal: She exhibits edema.  Ace wrap on tip of stump. Still with 2+-3+ edema left thigh over ace wrap. Left leg tender to palpation and with AROM/PROM. Right foot with neuropathic ulcer under great toe--black edges with top layers sloughed off callus. .   Neurological: She is alert and oriented to person, place, and time. No cranial nerve deficit.  UE 5/5. LLE limited by pain. RLE 3-4/5 prox to distal. Decreased LT distally. Cognitively intact  Skin: She is not diaphoretic.  Psychiatric:  Pleasant and appropriate    Results for orders placed or performed during the hospital encounter of 04/03/18 (from the past 48 hour(s))  Glucose, capillary     Status: Abnormal   Collection Time: 04/08/18  4:11 PM  Result Value Ref Range   Glucose-Capillary 106 (H) 70 - 99 mg/dL  Glucose, capillary     Status: Abnormal   Collection Time: 04/08/18 10:10 PM  Result Value Ref Range   Glucose-Capillary 108 (H) 70 - 99 mg/dL  Basic metabolic panel     Status: Abnormal   Collection Time: 04/09/18  3:43 AM    Result Value Ref Range   Sodium 134 (L) 135 - 145 mmol/L   Potassium 4.4 3.5 - 5.1 mmol/L   Chloride 108 98 - 111 mmol/L   CO2 20 (L) 22 - 32 mmol/L   Glucose, Bld 95 70 - 99 mg/dL   BUN 39 (H) 6 - 20  mg/dL   Creatinine, Ser 1.76 (H) 0.44 - 1.00 mg/dL   Calcium 8.2 (L) 8.9 - 10.3 mg/dL   GFR calc non Af Amer 35 (L) >60 mL/min   GFR calc Af Amer 40 (L) >60 mL/min    Comment: (NOTE) The eGFR has been calculated using the CKD EPI equation. This calculation has not been validated in all clinical situations. eGFR's persistently <60 mL/min signify possible Chronic Kidney Disease.    Anion gap 6 5 - 15    Comment: Performed at [facility] [address], [place], [place] [address]  CBC     Status: Abnormal   Collection Time: 04/09/18  3:43 AM  Result Value Ref Range   WBC 11.8 (H) 4.0 - 10.5 K/uL   RBC 2.98 (L) 3.87 - 5.11 MIL/uL   Hemoglobin 7.1 (L) 12.0 - 15.0 g/dL   HCT 24.4 (L) 36.0 - 46.0 %   MCV 81.9 80.0 - 100.0 fL   MCH 23.8 (L) 26.0 - 34.0 pg   MCHC 29.1 (L) 30.0 - 36.0 g/dL   RDW 15.8 (H) 11.5 - 15.5 %   Platelets 367 150 - 400 K/uL   nRBC 0.4 (H) 0.0 - 0.2 %    Comment: Performed at [facility], [place] [address] [place], [place] [address]  Ferritin     Status: None   Collection Time: 04/09/18  3:43 AM  Result Value Ref Range   Ferritin 203 11 - 307 ng/mL    Comment: Performed at [facility], [place] [address] [place], [place] [address]  Iron and TIBC     Status: Abnormal   Collection Time: 04/09/18  3:43 AM  Result Value Ref Range   Iron 9 (L) 28 - 170 ug/dL   TIBC NOT CALCULATED 250 - 450 ug/dL   Saturation Ratios NOT CALCULATED 10.4 - 31.8 %   UIBC NOT CALCULATED ug/dL    Comment: Performed at [facility] [address] [place], [place] [address]  Glucose, capillary     Status: Abnormal   Collection Time: 04/09/18  6:14 AM  Result Value Ref Range   Glucose-Capillary 116 (H) 70 - 99 mg/dL  Urinalysis, Routine w reflex microscopic      Status: Abnormal   Collection Time: 04/09/18  9:07 AM  Result Value Ref Range   Color, Urine YELLOW YELLOW   APPearance HAZY (A) CLEAR   Specific Gravity, Urine 1.006 1.005 - 1.030   pH 6.0 5.0 - 8.0   Glucose, UA 50 (A) NEGATIVE mg/dL   Hgb urine dipstick MODERATE (A) NEGATIVE   Bilirubin Urine NEGATIVE NEGATIVE   Ketones, ur NEGATIVE NEGATIVE mg/dL   Protein, ur 100 (A) NEGATIVE mg/dL   Nitrite NEGATIVE NEGATIVE   Leukocytes, UA NEGATIVE NEGATIVE   RBC / HPF 0-5 0 - 5 RBC/hpf   WBC, UA 0-5 0 - 5 WBC/hpf   Bacteria, UA RARE (A) NONE SEEN   Mucus PRESENT    Amorphous Crystal PRESENT     Comment: Performed at [facility], [address], [place], [place] [address]  Glucose, capillary     Status: Abnormal   Collection Time: 04/09/18 11:30 AM  Result Value Ref Range   Glucose-Capillary 105 (H) 70 - 99 mg/dL  Glucose, capillary     Status: Abnormal   Collection Time: 04/09/18  4:58 PM  Result Value Ref Range   Glucose-Capillary 156 (H) 70 - 99 mg/dL  Glucose, capillary  Status: Abnormal   Collection Time: 04/09/18  9:58 PM  Result Value Ref Range   Glucose-Capillary 66 (L) 70 - 99 mg/dL  CBC     Status: Abnormal   Collection Time: 04/10/18  3:56 AM  Result Value Ref Range   WBC 10.7 (H) 4.0 - 10.5 K/uL   RBC 2.85 (L) 3.87 - 5.11 MIL/uL   Hemoglobin 7.0 (L) 12.0 - 15.0 g/dL   HCT 23.8 (L) 36.0 - 46.0 %   MCV 83.5 80.0 - 100.0 fL   MCH 24.6 (L) 26.0 - 34.0 pg   MCHC 29.4 (L) 30.0 - 36.0 g/dL   RDW 15.6 (H) 11.5 - 15.5 %   Platelets 343 150 - 400 K/uL   nRBC 0.5 (H) 0.0 - 0.2 %    Comment: Performed at [facility], [address] [place], [place] [address]  Basic metabolic panel     Status: Abnormal   Collection Time: 04/10/18  3:56 AM  Result Value Ref Range   Sodium 137 135 - 145 mmol/L   Potassium 4.5 3.5 - 5.1 mmol/L   Chloride 109 98 - 111 mmol/L   CO2 23 22 - 32 mmol/L   Glucose, Bld 153 (H) 70 - 99 mg/dL   BUN 34 (H) 6 - 20 mg/dL   Creatinine,  Ser 1.50 (H) 0.44 - 1.00 mg/dL   Calcium 8.3 (L) 8.9 - 10.3 mg/dL   GFR calc non Af Amer 42 (L) >60 mL/min   GFR calc Af Amer 49 (L) >60 mL/min    Comment: (NOTE) The eGFR has been calculated using the CKD EPI equation. This calculation has not been validated in all clinical situations. eGFR's persistently <60 mL/min signify possible Chronic Kidney Disease.    Anion gap 5 5 - 15    Comment: Performed at [facility] [address] [place], [place] [address]  Type and screen [place]     Status: None (Preliminary result)   Collection Time: 04/10/18  8:00 AM  Result Value Ref Range   ABO/RH(D) B POS    Antibody Screen NEG    Sample Expiration 04/13/2018    Unit Number [id number]    Blood Component Type RBC LR PHER1    Unit division 00    Status of Unit ISSUED    Transfusion Status OK TO TRANSFUSE    Crossmatch Result      Compatible Performed at [facility], [place] [address], [place], [place] [address]   Prepare RBC     Status: None   Collection Time: 04/10/18  8:00 AM  Result Value Ref Range   Order Confirmation      ORDER PROCESSED BY BLOOD BANK Performed at [facility], [place] [address] [place], [place] [address]   Glucose, capillary     Status: Abnormal   Collection Time: 04/10/18  8:36 AM  Result Value Ref Range   Glucose-Capillary 131 (H) 70 - 99 mg/dL  Glucose, capillary     Status: Abnormal   Collection Time: 04/10/18 12:29 PM  Result Value Ref Range   Glucose-Capillary 118 (H) 70 - 99 mg/dL   No results found.     Medical Problem List and Plan: 1.  Functional and mobility deficits secondary to left lower extremity myositis/strep bacteremia  -admit to inpatient rehab 2.  DVT Prophylaxis/Anticoagulation: Pharmaceutical: Lovenox 3.  Chronic back pain/ Management: Continue oxycodone as needed 4. Mood: LCSW to follow for evaluation and support.  Ego support to be provided by team 5. Neuropsych: This patient is capable  of making decisions on her own behalf. 6. Skin/Wound Care: Routine pressure relief measures.  Encourage boosting 7. Fluids/Electrolytes/Nutrition: Monitor I's and O's. 8.  Group B strep agalactiae bacteremia: Continue IV antibiotics with end date 11/28 9.  T2DM with neuropathy: We will monitor blood sugars AC at bedtime.  Continue to titrate Lantus twice daily and for tighter control. 10.  Chronic constipation: Finally had BM today after mag citrate. Will continue to augment back bowel program 11.  HTN: Monitor blood pressures twice daily. Off Bumex at this time.  12.  Peripheral edema: Monitor weights daily.  -compression/elevation of LE's 13.  Bipolar disorder with depression: Continue Prozac 14.  Acute on chronic renal failure: SCr 2.72 at admission---> down to 1.5 with hydration and medication adjustment. Avoid nephrotoxic medications.  15.  Anemia of chronic disease/iron deficiency:  H&H down to 7.0 23.8 today.  Complete transfusion. Monitor stool guaiacs. Add iron supplement.  16. Morbid obesiy-BMI-48.6: Educate patient on appropriate diet and importance of activity to help promote health and mobility.      Post Admission Physician Evaluation: 1. Functional deficits secondary  to debility related to multiple medical, hx of left BKA. 2. Patient is admitted to receive collaborative, interdisciplinary care between the physiatrist, rehab nursing staff, and therapy team. 3. Patient's level of medical complexity and substantial therapy needs in context of that medical necessity cannot be provided at a lesser intensity of care such as a SNF. 4. Patient has experienced substantial functional loss from his/her baseline which was documented above under the "Functional History" and "Functional Status" headings.  Judging by the patient's diagnosis, physical exam, and functional history, the patient has potential for functional progress which will result in measurable gains while on inpatient rehab.   These gains will be of substantial and practical use upon discharge  in facilitating mobility and self-care at the household level. 5. Physiatrist will provide 24 hour management of medical needs as well as oversight of the therapy plan/treatment and provide guidance as appropriate regarding the interaction of the two. 6. The Preadmission Screening has been reviewed and patient status is unchanged unless otherwise stated above. 7. 24 hour rehab nursing will assist with bladder management, bowel management, safety, skin/wound care, disease management, medication administration, pain management and patient education  and help integrate therapy concepts, techniques,education, etc. 8. PT will assess and treat for/with: Lower extremity strength, range of motion, stamina, balance, functional mobility, safety, adaptive techniques and equipment, NMR, pain mgt, pre-prosthetic ed, ego support, community reentry.   Goals are: mod I at w/c level. 9. OT will assess and treat for/with: ADL's, functional mobility, safety, upper extremity strength, adaptive techniques and equipment, NMR, pain mgt, ego support, community reentry.   Goals are: mod I to supervision. Therapy may proceed with showering this patient. 10. SLP will assess and treat for/with: n/a.  Goals are: n/a. 11. Case Management and Social Worker will assess and treat for psychological issues and discharge planning. 12. Team conference will be held weekly to assess progress toward goals and to determine barriers to discharge. 13. Patient will receive at least 3 hours of therapy per day at least 5 days per week. 14. ELOS: 14-18 days       15. Prognosis:  excellent   I have personally performed a face to face diagnostic evaluation of this patient and formulated the key components of the plan.  Additionally, I have personally reviewed laboratory  data, imaging studies, as well as relevant notes and concur with the physician assistant's documentation  above.  Meredith Staggers, MD, [facility]    Bary Leriche, PA-C 04/10/2018

## 2018-04-10 NOTE — Care Management Note (Signed)
Case Management Note  Patient Details  Name: [name] [name] MRN: [id number] Date of Birth: 1975-12-19  Subjective/Objective:                    Action/Plan: Patient being admitted to CIR today.   Order for Glucometer text paged MD, NCM no longer orders same. Patient can purchase one or MD can provide prescription for patient to submit to insurance,     Expected Discharge Date:                  Expected Discharge Plan:  [place]  In-House Referral:     Discharge planning Services  CM Consult  Post Acute Care Choice:  Durable Medical Equipment Choice offered to:     DME Arranged:  Glucometer DME Agency:  NA  HH Arranged:  NA HH Agency:  NA  Status of Service:  Completed, signed off  If discussed at [place] of Stay Meetings, dates discussed:    Additional Comments:  Marilu Favre, RN 04/10/2018, 1:31 PM

## 2018-04-10 NOTE — Progress Notes (Signed)
Occupational Therapy Treatment Patient Details Name: [name] [name] MRN: [id number] DOB: 1975/10/15 Today's Date: 04/10/2018    History of present illness Pt is a 42 y.o. female admitted 04/03/18 with fever, tachycardia and severe L stump pain; worked up for L stump infection with borderline sepsis. MRI of LLE shows no evidence of steomyelitis; severe edema, tricompartmental OA of L knee. Per ortho, no indication for surgical intervention. PMH includes L transtibial amputation (03/2017; revision 05/2017), CKD2, OSA, uncontrolled DM2, peripheral neuropathy, fibromyalgia, bipolar disorder, anxiety.   OT comments  Pt attempting sit<>stand with walker x2 and sit to stand in stedy. Pt required total +2 max (A) with sliding board to drop arm recliner. Pt will require drop arm 3n1. Pt motivated and positive attitude demonstrated by joking during session.    Follow Up Recommendations  CIR    Equipment Recommendations  3 in 1 bedside commode;Wheelchair (measurements OT);Wheelchair cushion (measurements OT)(drop arm for toilet and chair)    Recommendations for Other Services Rehab consult    Precautions / Restrictions Precautions Precautions: Fall       Mobility Bed Mobility Overal bed mobility: Needs Assistance Bed Mobility: Supine to Sit     Supine to sit: Min guard     General bed mobility comments: min guard for safety; increased time and effort; use of rails to get into long sitting   Transfers Overall transfer level: Needs assistance Equipment used: Sliding board;Rolling walker (2 wheeled) Transfers: Lateral/Scoot Transfers;Sit to/from Stand Sit to Stand: Total assist;+2 physical assistance;From elevated surface        Lateral/Scoot Transfers: +2 physical assistance;Max assist;With slide board General transfer comment: attempted sit to stand from elevated bed height with RW then Stedy standing frame but unsuccessful; pt able to laterally scoot with use of slide board  to drop arm recliner with max A +2 and max cues for anterior translation of trunk and sequencing with R knee blocked; pt has tendency for posterior lean while transfering    Balance Overall balance assessment: Needs assistance Sitting-balance support: Bilateral upper extremity supported;Feet supported Sitting balance-Leahy Scale: Poor                                     ADL either performed or assessed with clinical judgement   ADL Overall ADL's : Needs assistance/impaired Eating/Feeding: Independent   Grooming: Wash/dry hands   Upper Body Bathing: Minimal assistance   Lower Body Bathing: Maximal assistance           Toilet Transfer: +2 for physical assistance;Maximal assistance(sliding board) Toilet Transfer Details (indicate cue type and reason): simulated bed to drop arm chair   Toileting - Clothing Manipulation Details (indicate cue type and reason): will need drop arm toilet             Vision       Perception     Praxis      Cognition Arousal/Alertness: Awake/alert Behavior During Therapy: WFL for tasks assessed/performed Overall Cognitive Status: Within Functional Limits for tasks assessed                                 General Comments: distracted by pain/ repeatly stating "my leg wasnt that small better. that is why it hurts so badly"         Exercises     Shoulder Instructions  General Comments L LE wrap in place and will need frequent checks    Pertinent Vitals/ Pain       Pain Assessment: Faces Faces Pain Scale: Hurts even more Pain Location: L residual limb Pain Descriptors / Indicators: Grimacing;Guarding;Moaning;Cramping;Aching Pain Intervention(s): Monitored during session;Premedicated before session;Repositioned  Home Living                                          Prior Functioning/Environment              Frequency  Min 2X/week        Progress Toward Goals  OT  Goals(current goals can now be found in the care plan section)  Progress towards OT goals: Progressing toward goals  Acute Rehab OT Goals Patient Stated Goal: to go to CIR OT Goal Formulation: With patient Time For Goal Achievement: 04/20/18 Potential to Achieve Goals: Good  Plan Discharge plan remains appropriate    Co-evaluation      Reason for Co-Treatment: Complexity of the patient's impairments (multi-system involvement);For patient/therapist safety;To address functional/ADL transfers PT goals addressed during session: Mobility/safety with mobility;Balance;Proper use of DME;Strengthening/ROM OT goals addressed during session: ADL's and self-care;Proper use of Adaptive equipment and DME;Strengthening/ROM      AM-PAC PT "6 Clicks" Daily Activity     Outcome Measure   Help from another person eating meals?: None Help from another person taking care of personal grooming?: A Little Help from another person toileting, which includes using toliet, bedpan, or urinal?: Total Help from another person bathing (including washing, rinsing, drying)?: A Lot Help from another person to put on and taking off regular upper body clothing?: A Little Help from another person to put on and taking off regular lower body clothing?: Total 6 Click Score: 14    End of Session Equipment Utilized During Treatment: Gait belt  OT Visit Diagnosis: Unsteadiness on feet (R26.81);Other abnormalities of gait and mobility (R26.89);Muscle weakness (generalized) (M62.81);Pain   Activity Tolerance Patient tolerated treatment well   Patient Left in chair;with call bell/phone within reach;with chair alarm set   Nurse Communication Mobility status;Precautions        Time: 1046(1046)-1121 OT Time Calculation (min): 35 min  Charges: OT General Charges $OT Visit: 1 Visit OT Treatments $Self Care/Home Management : 8-22 mins   Jeri Modena, OTR/L  Acute Rehabilitation Services Pager:  [phone] Office: [phone] .    Jeri Modena 04/10/2018, 3:21 PM

## 2018-04-11 ENCOUNTER — Inpatient Hospital Stay (HOSPITAL_COMMUNITY): Payer: Self-pay | Admitting: Occupational Therapy

## 2018-04-11 ENCOUNTER — Telehealth: Payer: Self-pay | Admitting: *Deleted

## 2018-04-11 ENCOUNTER — Inpatient Hospital Stay (HOSPITAL_COMMUNITY): Payer: Self-pay

## 2018-04-11 LAB — CBC WITH DIFFERENTIAL/PLATELET
Band Neutrophils: 2 %
Basophils Absolute: 0 10*3/uL (ref 0.0–0.1)
Basophils Relative: 0 %
EOS PCT: 3 %
Eosinophils Absolute: 0.4 10*3/uL (ref 0.0–0.5)
HEMATOCRIT: 26.9 % — AB (ref 36.0–46.0)
Hemoglobin: 7.6 g/dL — ABNORMAL LOW (ref 12.0–15.0)
LYMPHS ABS: 2.1 10*3/uL (ref 0.7–4.0)
Lymphocytes Relative: 18 %
MCH: 23.8 pg — AB (ref 26.0–34.0)
MCHC: 28.3 g/dL — ABNORMAL LOW (ref 30.0–36.0)
MCV: 84.3 fL (ref 80.0–100.0)
METAMYELOCYTES PCT: 2 %
MONOS PCT: 6 %
MYELOCYTES: 1 %
Monocytes Absolute: 0.7 10*3/uL (ref 0.1–1.0)
NEUTROS PCT: 68 %
Neutro Abs: 8.3 10*3/uL — ABNORMAL HIGH (ref 1.7–7.7)
Platelets: 417 10*3/uL — ABNORMAL HIGH (ref 150–400)
RBC: 3.19 MIL/uL — AB (ref 3.87–5.11)
RDW: 15.5 % (ref 11.5–15.5)
WBC: 11.8 10*3/uL — AB (ref 4.0–10.5)
nRBC: 0 /100 WBC
nRBC: 0.5 % — ABNORMAL HIGH (ref 0.0–0.2)

## 2018-04-11 LAB — BPAM RBC
Blood Product Expiration Date: 201911282359
ISSUE DATE / TIME: 201911191001
Unit Type and Rh: 1700

## 2018-04-11 LAB — GLUCOSE, CAPILLARY
Glucose-Capillary: 149 mg/dL — ABNORMAL HIGH (ref 70–99)
Glucose-Capillary: 119 mg/dL — ABNORMAL HIGH (ref 70–99)
Glucose-Capillary: 144 mg/dL — ABNORMAL HIGH (ref 70–99)
Glucose-Capillary: 146 mg/dL — ABNORMAL HIGH (ref 70–99)

## 2018-04-11 LAB — COMPREHENSIVE METABOLIC PANEL
ALK PHOS: 148 U/L — AB (ref 38–126)
ALT: 14 U/L (ref 0–44)
AST: 14 U/L — AB (ref 15–41)
Albumin: <1 g/dL — ABNORMAL LOW (ref 3.5–5.0)
Anion gap: 5 (ref 5–15)
BILIRUBIN TOTAL: 0.3 mg/dL (ref 0.3–1.2)
BUN: 29 mg/dL — AB (ref 6–20)
CO2: 23 mmol/L (ref 22–32)
Calcium: 8.2 mg/dL — ABNORMAL LOW (ref 8.9–10.3)
Chloride: 108 mmol/L (ref 98–111)
Creatinine, Ser: 1.23 mg/dL — ABNORMAL HIGH (ref 0.44–1.00)
GFR calc Af Amer: >60 mL/min (ref >60)
GFR, EST NON AFRICAN AMERICAN: 54 mL/min — AB (ref >60)
Glucose, Bld: 191 mg/dL — ABNORMAL HIGH (ref 70–99)
Potassium: 4.6 mmol/L (ref 3.5–5.1)
Sodium: 136 mmol/L (ref 135–145)
Total Protein: 7 g/dL (ref 6.5–8.1)

## 2018-04-11 LAB — TYPE AND SCREEN
ABO/RH(D): B POS
ANTIBODY SCREEN: NEGATIVE
Unit division: 0

## 2018-04-11 NOTE — Telephone Encounter (Signed)
[facility] calls and states if dr Tarri Abernethy thinks [name] [name] would benefit from [facility] services to please refer.

## 2018-04-11 NOTE — Evaluation (Signed)
Occupational Therapy Assessment and Plan  Patient Details  Name: [name] [name] MRN: [id number] Date of Birth: [birth year]  OT Diagnosis: muscle weakness (generalized) Rehab Potential: Rehab Potential (ACUTE ONLY): Good ELOS: 14-18 days   Today's Date: 04/11/2018 OT Individual Time: 0930-1030 OT Individual Time Calculation (min): 60 min     Problem List:  Patient Active Problem List   Diagnosis Date Noted  . Debility 04/10/2018  . Bacteremia due to group B Streptococcus 04/04/2018  . Severe protein-calorie malnutrition ([place])   . BKA stump complication ([place]) 22/29/7989  . Vaginal candidiasis 03/12/2018  . Herpes genitalis in women 01/31/2018  . Herpes simplex type 1 infection 01/16/2018  . Status post below knee amputation, left ([place]) 04/11/2017  . Bipolar affective disorder ([place])   . Adjustment disorder with depressed mood 03/26/2017  . Abnormal ECG 03/24/2017  . Sepsis ([place]) 03/24/2017  . Heart failure with preserved ejection fraction ([place]) 02/09/2017  . Routine adult health maintenance 12/08/2016  . Chronic pain of right knee 09/08/2016  . Chronic kidney disease (CKD), stage III (moderate) ([place]) 07/15/2016  . OSA (obstructive sleep apnea) 05/13/2016  . Morbid obesity due to excess calories ([place]) 11/27/2015  . History of Low serum cortisol level ([place]) 01/07/2015  . Hypothyroidism 04/25/2013  . Financial difficulty 04/25/2013  . Acute renal failure ([place]) 07/12/2012  . Uncontrolled type 2 diabetes mellitus ([place]) 07/12/2012  . Necrotizing fasciitis s/p OR debridements 07/06/2012  . Carpal tunnel syndrome, bilateral 01/25/2012  . Diabetic peripheral neuropathy ([place]) 07/04/2011  . Anxiety and depression 06/02/2011  . History of abnormal cervical Pap smear 12/13/2007  . GERD 12/06/2007  . Hyperlipidemia 08/29/2007  . Hypertension associated with diabetes ([place]) 08/29/2007    Past Medical History:  Past Medical History:  Diagnosis Date  . Abdominal muscle pain  09/08/2016  . Abnormal Pap smear of cervix 2009  . Abscess    history of multiple abscesses  . Acute bilateral low back pain 02/13/2017  . Acute blood loss anemia   . Anemia of chronic disease 2002  . Anxiety    Panic attacks  . Bilateral lower extremity edema 05/13/2016  . Bipolar disorder ([place])   . Cellulitis 05/21/2014   right eye  . Chronic bronchitis ([place])    "get it q yr" (05/13/2013)  . Chronic pain   . Depression   . Edema of lower extremity   . Endocarditis 2002   subacute bacterial endocarditis.   . Family history of anesthesia complication    "my mom has a hard time coming out from under"  . Fibromyalgia   . GERD (gastroesophageal reflux disease)    occ  . Heart murmur   . History of blood transfusion    "just low blood count" (05/13/2013)  . Hyperlipidemia   . Hypertension   . Hypothyroidism   . Hypothyroidism, adult 03/21/2014  . Leukocytosis   . Necrosis ([place])    and ulceration  . Obesity   . OSA on CPAP    does not wear CPAP  . Peripheral neuropathy   . Type II diabetes mellitus ([place])    Type  II   Past Surgical History:  Past Surgical History:  Procedure Laterality Date  . AMPUTATION Left 04/07/2017   Procedure: LEFT BELOW KNEE AMPUTATION;  Surgeon: Newt Minion, MD;  Location: [facility];  Service: Orthopedics;  Laterality: Left;  . EYE SURGERY     [place]  . INCISION AND DRAINAGE ABSCESS     multiple I&Ds  . INCISION  AND DRAINAGE ABSCESS Left 07/09/2012   Procedure: DRESSING CHANGE, THIGH WOUND;  Surgeon: Harl Bowie, MD;  Location: [place];  Service: General;  Laterality: Left;  . INCISION AND DRAINAGE OF WOUND Left 07/07/2012   Procedure: IRRIGATION AND DEBRIDEMENT WOUND;  Surgeon: Harl Bowie, MD;  Location: [place];  Service: General;  Laterality: Left;  . INCISION AND DRAINAGE PERIRECTAL ABSCESS Left 07/14/2012   Procedure: DEBRIDEMENT OF SKIN & SOFT TISSUE; DRESSING CHANGE UNDER ANESTHESIA;  Surgeon: Gayland Curry, MD,FACS;  Location: [place];  Service: General;  Laterality: Left;  . INCISION AND DRAINAGE PERIRECTAL ABSCESS Left 07/16/2012   Procedure: I&D Left Thigh;  Surgeon: Gwenyth Ober, MD;  Location: [place];  Service: General;  Laterality: Left;  . INCISION AND DRAINAGE PERIRECTAL ABSCESS N/A 01/05/2015   Procedure: IRRIGATION AND DEBRIDEMENT PERIRECTAL ABSCESS;  Surgeon: Donnie Mesa, MD;  Location: [place];  Service: General;  Laterality: N/A;  . IRRIGATION AND DEBRIDEMENT ABSCESS Left 07/06/2012   Procedure: IRRIGATION AND DEBRIDEMENT ABSCESS BUTTOCKS AND THIGH;  Surgeon: Shann Medal, MD;  Location: [facility];  Service: General;  Laterality: Left;  . IRRIGATION AND DEBRIDEMENT ABSCESS Left 08/10/2012   Procedure: IRRIGATION AND DEBRIDEMENT ABSCESS;  Surgeon: Madilyn Hook, DO;  Location: [place];  Service: General;  Laterality: Left;  . STUMP REVISION Left 06/09/2017   Procedure: REVISION LEFT BELOW KNEE AMPUTATION;  Surgeon: Newt Minion, MD;  Location: [facility];  Service: Orthopedics;  Laterality: Left;  . TONSILLECTOMY  1994    Assessment & Plan Clinical Impression: [name] [name] a 35 y.owith history of T2DM, OSA, CKD, bipolar disorder, morbid obesity--BMI 50, ongoing tobacco use, left BKA--CIR 03/2018 with recent falls and increasing edema and pain in left arm. She was admitted on 04/03/2018 with fever due to sepsis from group B bacteremia. She was started on broad-spectrum antibiotics and MRI of left lower extremity done showing generalized soft tissue edema and distal thigh with severe muscle edema and residual gastrocnemius question infectious versus inflammatory myositis. Dr. Sharol Given felt that there was no need for surgical intervention and antibiotics narrowed to ceftriaxone with recommendations to complete 2 weeks antibiotic course--end date 11/28. Patient has had issues with nausea with poor appetite,acute on chronic anemia requiring transfusion with 1 units,acute on chronic renal failure treated with IV  fluids,poorly controlled blood sugars as well as issues with constipation. Patient noted to be debilitated and therapy has been ongoing. CIR recommended for follow-up therapy Patient transferred to CIR on 04/10/2018 .    Patient currently requires max with basic self-care skills secondary to muscle weakness, decreased cardiorespiratoy endurance and decreased sitting balance, decreased standing balance, decreased postural control and decreased balance strategies.  Prior to hospitalization, patient could complete ADLs with modified independent .  Patient will benefit from skilled intervention to decrease level of assist with basic self-care skills and increase level of independence with iADL prior to discharge home with care partner.  Anticipate patient will require intermittent supervision and minimal physical assistance and follow up home health.  OT - End of Session Activity Tolerance: Tolerates 10 - 20 min activity with multiple rests Endurance Deficit: Yes Endurance Deficit Description: generalized weakness OT Assessment Rehab Potential (ACUTE ONLY): Good OT Barriers to Discharge: Inaccessible home environment;Decreased caregiver support OT Patient demonstrates impairments in the following area(s): Balance;Safety;Sensory;Skin Integrity;Vision;Edema;Endurance;Motor;Nutrition;Pain OT Basic ADL's Functional Problem(s): Grooming;Bathing;Dressing;Toileting OT Transfers Functional Problem(s): Toilet;Tub/Shower OT Additional Impairment(s): None OT Plan OT Intensity: Minimum of 1-2 x/day, 45 to 90 minutes  OT Frequency: 5 out of 7 days OT Duration/Estimated Length of Stay: 14-18 days OT Self Feeding Anticipated Outcome(s): independent OT Basic Self-Care Anticipated Outcome(s): mod I OT Toileting Anticipated Outcome(s): mod I OT Bathroom Transfers Anticipated Outcome(s): (S) OT Recommendation Patient destination: Home Follow Up Recommendations: Home health OT Equipment Recommended:  Tub/shower bench   Skilled Therapeutic Intervention Initiated skilled OT intervention and edu pt on OT POC, goal setting, ELOS, and rehab expectations. Pt completed bed mobility with mod I using bed rails for placement of bed pan and peri hygiene following toileting. Pt able to bath UB with (S) at bed level and max A for LB. Pt transitioned to EOB with mod I. Extensive discussion re previous care of and continued care of L residual limb. Pt completed lateral scoot to w/c toward L with +2 assistance for safety, max-mod A. Pt completed grooming tasks seated at sink with set up assist. Pt edu on unit's fall prevention policy and left sitting up in w/c with all needs met.   OT Evaluation Precautions/Restrictions  Precautions Precautions: Fall Restrictions Weight Bearing Restrictions: Yes RLE Weight Bearing: Partial weight bearing LLE Weight Bearing: Non weight bearing General Chart Reviewed: Yes Family/Caregiver Present: No Pain Pain Assessment Pain Scale: 0-10 Pain Score: 6  Pain Type: Acute pain Pain Location: Leg Pain Orientation: Left Pain Descriptors / Indicators: Aching Pain Onset: On-going Pain Intervention(s): Repositioned Home Living/Prior Functioning Home Living Available Help at Discharge: Family, Available PRN/intermittently Type of Home: House Home Access: Stairs to enter Technical brewer of Steps: 2 Entrance Stairs-Rails: Right, Left Home Layout: One level Bathroom Shower/Tub: Tub/shower unit, Architectural technologist: Standard Bathroom Accessibility: Yes Additional Comments: Ramp was never built; pt navigates stairs into home.   Lives With: Family IADL History Homemaking Responsibilities: Yes Meal Prep Responsibility: Secondary Laundry Responsibility: Secondary Cleaning Responsibility: Secondary Bill Paying/Finance Responsibility: Secondary Shopping Responsibility: Secondary Child Care Responsibility: Secondary Occupation: On disability Prior  Function Level of Independence: Needs assistance with ADLs, Independent with transfers Bath: Supervision/set-up Dressing: Supervision/set-up Driving: No Vocation: On disability Comments: Mourning loss of mother who passed 04/2017 who was pt's main support system   Vision Baseline Vision/History: Wears glasses Wears Glasses: Reading only Patient Visual Report: Blurring of vision(reports deficits are a result of diabetic neuropathy) Vision Assessment?: Yes Ocular Range of Motion: Within Functional Limits Alignment/Gaze Preference: Within Defined Limits Tracking/Visual Pursuits: Impaired - to be further tested in functional context Saccades: Within functional limits Additional Comments: Pt with limited peripheral vision Perception  Perception: Within Functional Limits Praxis Praxis: Intact Cognition Overall Cognitive Status: Within Functional Limits for tasks assessed Arousal/Alertness: Awake/alert Orientation Level: Place;Situation;Person Person: Oriented Place: Oriented Situation: Oriented Year: 2019 Month: November Day of Week: Correct Memory: Appears intact Immediate Memory Recall: Blue;Bed;Sock Memory Recall: Blue;Bed Memory Recall Blue: Without Cue Memory Recall Bed: Without Cue Attention: Selective Selective Attention: Appears intact Awareness: Appears intact Problem Solving: Appears intact Safety/Judgment: Appears intact Sensation Sensation Light Touch: Impaired Detail Peripheral sensation comments: absent anterior lower leg, lateral foot Hot/Cold: Appears Intact Proprioception: Appears Intact Stereognosis: Appears Intact Coordination Gross Motor Movements are Fluid and Coordinated: No Fine Motor Movements are Fluid and Coordinated: Yes Coordination and Movement Description: impacted by generalized weakness 9 Hole Peg Test: [facility] Motor  Motor Motor: Other (comment) Motor - Skilled Clinical Observations: generalized weakness Mobility  Bed Mobility Bed  Mobility: Rolling Right;Rolling Left;Right Sidelying to Sit Rolling Right: Independent with assistive device Rolling Left: Independent with assistive device  Trunk/Postural Assessment  Cervical Assessment Cervical Assessment: Within  Functional Limits Thoracic Assessment Thoracic Assessment: Within Functional Limits Lumbar Assessment Lumbar Assessment: Within Functional Limits Postural Control Postural Control: Within Functional Limits  Balance Balance Balance Assessed: Yes Static Sitting Balance Static Sitting - Balance Support: Feet unsupported Static Sitting - Level of Assistance: 5: Stand by assistance Dynamic Sitting Balance Dynamic Sitting - Balance Support: Feet unsupported Dynamic Sitting - Level of Assistance: 5: Stand by assistance Extremity/Trunk Assessment RUE Assessment RUE Assessment: Within Functional Limits LUE Assessment LUE Assessment: Within Functional Limits     Refer to Care Plan for Long Term Goals  Recommendations for other services: Neuropsych   Discharge Criteria: Patient will be discharged from OT if patient refuses treatment 3 consecutive times without medical reason, if treatment goals not met, if there is a change in medical status, if patient makes no progress towards goals or if patient is discharged from hospital.  The above assessment, treatment plan, treatment alternatives and goals were discussed and mutually agreed upon: by patient  Curtis Sites 04/11/2018, 12:15 PM

## 2018-04-11 NOTE — Progress Notes (Signed)
[facility] PHYSICAL MEDICINE & REHABILITATION PROGRESS NOTE   Subjective/Complaints: Had a fair night. Pain under fair control. Left leg remains quite sore  ROS: Patient denies fever, rash, sore throat, blurred vision, nausea, vomiting, diarrhea, cough, shortness of breath or chest pain,  back pain, headache, or mood change.    Objective:   No results found. Recent Labs    04/10/18 0356 04/11/18 0331  WBC 10.7* 11.8*  HGB 7.0* 7.6*  HCT 23.8* 26.9*  PLT 343 417*   Recent Labs    04/10/18 0356 04/11/18 0331  NA 137 136  K 4.5 4.6  CL 109 108  CO2 23 23  GLUCOSE 153* 191*  BUN 34* 29*  CREATININE 1.50* 1.23*  CALCIUM 8.3* 8.2*    Intake/Output Summary (Last 24 hours) at 04/11/2018 0735 Last data filed at 04/11/2018 0338 Gross per 24 hour  Intake 10 ml  Output -  Net 10 ml     Physical Exam: Vital Signs Blood pressure (!) 145/78, pulse 97, temperature 98.1 F (36.7 C), resp. rate 18, weight (!) 152 kg, SpO2 98 %. Constitutional: No distress . Vital signs reviewed. obese HEENT: EOMI, oral membranes moist Neck: supple Cardiovascular: RRR without murmur. No JVD    Respiratory: CTA Bilaterally without wheezes or rales. Normal effort    GI: BS +, non-tender, non-distended  Musculoskeletal: She exhibitsedema. Ace wrap on tip of stump. Still with 2+-3+ edema left thigh over ace wrap. Left leg remains tender to palpation and with AROM/PROM. Right foot with neuropathic ulcer under great toe--black edges with top layers sloughed off callus. . Neurological: She isalertand oriented to person, place, and time. Nocranial nerve deficit. UE 5/5. LLE limited by pain. RLE 3-4/5 prox to distal. Decreased LT distally.  Cognitively appropriately Skin: She isnot diaphoretic.  Psychiatric:   Assessment/Plan: 1. Functional deficits secondary to LLE myositis which require 3+ hours per day of interdisciplinary therapy in a comprehensive inpatient rehab  setting.  Physiatrist is providing close team supervision and 24 hour management of active medical problems listed below.  Physiatrist and rehab team continue to assess barriers to discharge/monitor patient progress toward functional and medical goals  Care Tool:  Bathing              Bathing assist       Upper Body Dressing/Undressing Upper body dressing        Upper body assist      Lower Body Dressing/Undressing Lower body dressing            Lower body assist       Toileting Toileting    Toileting assist Assist for toileting: Moderate Assistance - Patient 50 - 74%     Transfers Chair/bed transfer  Transfers assist           Locomotion Ambulation   Ambulation assist              Walk 10 feet activity   Assist           Walk 50 feet activity   Assist           Walk 150 feet activity   Assist           Walk 10 feet on uneven surface  activity   Assist           Wheelchair     Assist               Wheelchair 50 feet with 2 turns activity  Assist            Wheelchair 150 feet activity     Assist           Medical Problem List and Plan: 1.Functional and mobility deficitssecondary to left lower extremity myositis/strep bacteremia -beginning therapies today 2. DVT Prophylaxis/Anticoagulation: Pharmaceutical:Lovenox 3.Chronic back pain/Management: Continue oxycodone as needed 4. Mood:LCSW to follow for evaluation and support. Egosupport to be provided by team 5. Neuropsych: This patientiscapable of making decisions on herown behalf. 6. Skin/Wound Care:Routine pressure relief measures.    -pt aware of shifting, boosting 7. Fluids/Electrolytes/Nutrition:Monitor I's and O's.  -encourage PO intake  -I personally reviewed the patient's labs today.   8.Group B strep agalactiaebacteremia: Continue IV Ceftriaxone with end date 11/28  -wbc's stable,  afebrile 11.8---recheck Friday 9.T2DM with neuropathy: We will monitor blood sugars AC at bedtime.     -titrate Lantus twice daily and for tighter control as needed.  -sugars under reasonable control at present 10.Chronic constipation: BM 11/19.  Will continue to augment back bowel program 11.HTN: Monitor blood pressures twice daily. Off Bumex at this time.  12.Peripheral edema: Monitor weights daily. -compression/elevation of LE's 13.Bipolar disorder with depression: Continue Prozac 14.Acute on chronic renal failure: SCr 2.72 at admission--->down to 1.23 today with adequate hydration 15.Anemia of chronic disease/iron deficiency:H&H down to 7.0 23.8 on 11/19 but back up to 7.6 today after transfusion  - Monitor stool guaiacs.    - iron supplement.  16. Morbid obesiy-BMI-48.6: Educate patient on appropriate diet and importance of activity to help promote health and mobility.    LOS: 1 days A FACE TO FACE EVALUATION WAS PERFORMED  Meredith Staggers 04/11/2018, 7:35 AM

## 2018-04-11 NOTE — Progress Notes (Signed)
Meredith Staggers, MD  Physician  Physical Medicine and Rehabilitation  Consult Note  Signed  Date of Service:  04/06/2018 12:04 PM       Related encounter: ED to Hosp-Admission (Discharged) from 04/03/2018 in [facility]      Signed      Expand All Collapse All    Show:Clear all [x] Manual[x] Template[] Copied  Added by: [x] Love, Ivan Anchors, PA-C[x] Meredith Staggers, MD  [] Hover for details      Physical Medicine and Rehabilitation Consult   Reason for Consult: Debility.  Referring Physician: Dr. Evette Doffing   HPI: [name] [name] is a 42 y.o. female with history of T2DM, OSA, CKD, bipolar disorder, anxiety disorder, morbid obesity--BMI 50, ongoing tobacco use, left BKA (CIR 03/2017) who has had falls with increase in edema and pain left stump. She who was admitted on 04/03/2018 with fever due to sepsis from  group B bacteremia.  She was started on broad-spectrum IV antibiotics for treatment as well as IV Dilaudid for pain control MRI of LLE showed generalized soft tissue edema in distal thigh, severe muscle edema in residual gastrocnemius question infectious or due to inflammatory myositis. Dr. Sharol Given felt that there was no need for surgical intervention and antibiotics narrowed to ceftriaxone with recommendations to complete 2 weeks course.  She has had issues with nausea with poor appetite as well as pain with deficits in mobility.  Therapy evaluations done yesterday and CIR was recommended for follow-up therapy   Review of Systems  Constitutional: Negative for chills and fever.  HENT: Negative for hearing loss and tinnitus.   Eyes: Negative for blurred vision and double vision.  Respiratory: Positive for shortness of breath.   Cardiovascular: Positive for leg swelling. Negative for chest pain and palpitations.  Gastrointestinal: Positive for constipation (no bm for few days), nausea and vomiting.  Genitourinary: Negative  for dysuria and urgency.  Musculoskeletal: Positive for back pain, falls (multiple. ) and myalgias.  Skin: Negative for rash.  Neurological: Positive for focal weakness. Negative for dizziness and headaches.  Psychiatric/Behavioral: Positive for depression. The patient is nervous/anxious. The patient does not have insomnia.          Past Medical History:  Diagnosis Date  . Abdominal muscle pain 09/08/2016  . Abnormal Pap smear of cervix 2009  . Abscess    history of multiple abscesses  . Acute bilateral low back pain 02/13/2017  . Acute blood loss anemia   . Anemia of chronic disease 2002  . Anxiety    Panic attacks  . Bilateral lower extremity edema 05/13/2016  . Bipolar disorder ([place])   . Cellulitis 05/21/2014   right eye  . Chronic bronchitis ([place])    "get it q yr" (05/13/2013)  . Chronic pain   . Depression   . Edema of lower extremity   . Endocarditis 2002   subacute bacterial endocarditis.   . Family history of anesthesia complication    "my mom has a hard time coming out from under"  . Fibromyalgia   . GERD (gastroesophageal reflux disease)    occ  . Heart murmur   . History of blood transfusion    "just low blood count" (05/13/2013)  . Hyperlipidemia   . Hypertension   . Hypothyroidism   . Hypothyroidism, adult 03/21/2014  . Leukocytosis   . Necrosis ([place])    and ulceration  . Obesity   . OSA on CPAP    does not wear CPAP  . Peripheral  neuropathy   . Type II diabetes mellitus ([place])    Type  II         Past Surgical History:  Procedure Laterality Date  . AMPUTATION Left 04/07/2017   Procedure: LEFT BELOW KNEE AMPUTATION;  Surgeon: Newt Minion, MD;  Location: [place];  Service: Orthopedics;  Laterality: Left;  . EYE SURGERY     [facility]  . INCISION AND DRAINAGE ABSCESS     multiple I&Ds  . INCISION AND DRAINAGE ABSCESS Left 07/09/2012   Procedure: DRESSING CHANGE, THIGH WOUND;  Surgeon: Harl Bowie, MD;   Location: [place];  Service: General;  Laterality: Left;  . INCISION AND DRAINAGE OF WOUND Left 07/07/2012   Procedure: IRRIGATION AND DEBRIDEMENT WOUND;  Surgeon: Harl Bowie, MD;  Location: [facility];  Service: General;  Laterality: Left;  . INCISION AND DRAINAGE PERIRECTAL ABSCESS Left 07/14/2012   Procedure: DEBRIDEMENT OF SKIN & SOFT TISSUE; DRESSING CHANGE UNDER ANESTHESIA;  Surgeon: Gayland Curry, MD,FACS;  Location: [facility];  Service: General;  Laterality: Left;  . INCISION AND DRAINAGE PERIRECTAL ABSCESS Left 07/16/2012   Procedure: I&D Left Thigh;  Surgeon: Gwenyth Ober, MD;  Location: [facility];  Service: General;  Laterality: Left;  . INCISION AND DRAINAGE PERIRECTAL ABSCESS N/A 01/05/2015   Procedure: IRRIGATION AND DEBRIDEMENT PERIRECTAL ABSCESS;  Surgeon: Donnie Mesa, MD;  Location: [facility];  Service: General;  Laterality: N/A;  . IRRIGATION AND DEBRIDEMENT ABSCESS Left 07/06/2012   Procedure: IRRIGATION AND DEBRIDEMENT ABSCESS BUTTOCKS AND THIGH;  Surgeon: Shann Medal, MD;  Location: [facility];  Service: General;  Laterality: Left;  . IRRIGATION AND DEBRIDEMENT ABSCESS Left 08/10/2012   Procedure: IRRIGATION AND DEBRIDEMENT ABSCESS;  Surgeon: Madilyn Hook, DO;  Location: [facility];  Service: General;  Laterality: Left;  . STUMP REVISION Left 06/09/2017   Procedure: REVISION LEFT BELOW KNEE AMPUTATION;  Surgeon: Newt Minion, MD;  Location: [place] [facility];  Service: Orthopedics;  Laterality: Left;  . TONSILLECTOMY  1994         Family History  Problem Relation Age of Onset  . Heart failure Mother   . Diabetes Mother   . Kidney disease Mother   . Kidney disease Father   . Diabetes Father   . Diabetes Paternal Grandmother   . Heart failure Paternal Grandmother   . Other Other     Social History:  Nephew lives with her. Wheelchair bound and has had to crawl over one step to get in the house. She is able to ambulate household distances with RW? Has an aide 2 hrs/7 days for  housework . She reports that she has been smoking cigarettes--cut down to 1 PPD.  She has never used smokeless tobacco. She reports that she drinks alcohol. She reports that she does not use drugs.         Allergies  Allergen Reactions  . Vancomycin Other (See Comments)    Acute renal failure suspected secondary to vanco          Medications Prior to Admission  Medication Sig Dispense Refill  . amitriptyline (ELAVIL) 50 MG tablet TAKE 1 TABLET BY MOUTH AT BEDTIME (Patient taking differently: Take 50 mg by mouth at bedtime. ) 90 tablet 3  . atorvastatin (LIPITOR) 20 MG tablet TAKE 1 TABLET BY MOUTH EVERY DAY (Patient taking differently: Take 20 mg by mouth daily. ) 90 tablet 1  . bumetanide (BUMEX) 1 MG tablet Take 1 tablet (1 mg total) by mouth 2 (two)  times daily. 180 tablet 3  . cyclobenzaprine (FLEXERIL) 10 MG tablet TAKE 1 TABLET BY MOUTH EVERY TWELVE HOURS AS NEEDED for MUSCLE SPASMS (Patient taking differently: Take 10 mg by mouth 3 (three) times daily as needed for muscle spasms. ) 30 tablet 0  . diclofenac sodium (VOLTAREN) 1 % GEL Apply 4 grams to affected area 4 times daily as needed for pain (Patient taking differently: Apply 4 g topically 4 (four) times daily as needed (pain). ) 100 g 2  . docusate sodium (COLACE) 100 MG capsule Take 1 capsule (100 mg total) by mouth 2 (two) times daily. (Patient taking differently: Take 100 mg by mouth 2 (two) times daily as needed for moderate constipation. ) 10 capsule 0  . FLUoxetine (PROZAC) 20 MG/5ML solution Take 15 mLs (60 mg total) by mouth daily. 480 mL 1  . ibuprofen (ADVIL) 200 MG tablet Take 3 tablets (600 mg total) by mouth every 8 (eight) hours as needed for moderate pain (Take with food and water). 100 tablet 2  . insulin aspart (NOVOLOG FLEXPEN) 100 UNIT/ML FlexPen Inject 13 Units into the skin 3 (three) times daily with meals. (Patient taking differently: Inject 15 Units into the skin 3 (three) times daily with meals. ) 15 mL  1  . insulin glargine (LANTUS) 100 unit/mL SOPN Inject 0.5 mLs (50 Units total) into the skin 2 (two) times daily. (Patient taking differently: Inject 40 Units into the skin 2 (two) times daily. ) 15 mL 11  . LYRICA 100 MG capsule TAKE 1 CAPSULE BY MOUTH 3 TIMES DAILY (Patient taking differently: Take 100 mg by mouth 3 (three) times daily. ) 90 capsule 2  . mupirocin ointment (BACTROBAN) 2 % Apply 1 application topically 2 (two) times daily. (Patient taking differently: Apply 1 application topically 2 (two) times daily as needed (skin care). ) 22 g 6  . nystatin (MYCOSTATIN/NYSTOP) powder Apply topically 4 (four) times daily. After cleaning and drying the affected area for two weeks (Patient taking differently: Apply 1 g topically 4 (four) times daily as needed (skin care). After cleaning and drying the affected area for two weeks) 30 g 1  . oxyCODONE-acetaminophen (PERCOCET/ROXICET) 5-325 MG tablet Take 1 tablet by mouth every 6 (six) hours as needed for severe pain. 30 tablet 0  . polyethylene glycol (MIRALAX / GLYCOLAX) packet Take 17 g by mouth daily. (Patient taking differently: Take 17 g by mouth daily as needed for moderate constipation. ) 14 each 0  . ULTICARE INSULIN SYRINGE 31G X 1/4" 0.5 ML MISC Use to inject insulin as directed 100 each 2  . VICTOZA 18 MG/3ML SOPN Inject 0.3 mLs (1.8 mg total) into the skin daily. 9 mL 2  . fluconazole (DIFLUCAN) 150 MG tablet Please take one tablet every three days (72 hours apart) until completed (Patient not taking: Reported on 04/03/2018) 3 tablet 0    Home: Home Living Family/patient expects to be discharged to:: Private residence Living Arrangements: Other relatives(nephew) Available Help at Discharge: Family, Available PRN/intermittently Type of Home: House Home Access: Stairs to enter [employer] of Steps: 2 Entrance Stairs-Rails: Right, Left Home Layout: One level Bathroom Shower/Tub: Tub/shower unit, Architectural technologist:  Standard Home Equipment: Environmental consultant - 2 wheels, Wheelchair - manual Additional Comments: Ramp was never built; pt navigates stairs into home.   Functional History: Prior Function Level of Independence: Independent with assistive device(s) Gait / Transfers Assistance Needed: Uses RW a good bit around the house without prosthetic; recently got prosthetic, but  pt reports too big, so she only wears it in community. Mod indep with w/c mobility. Ascends/descends steps into home without assist. Able to get w/c into and out of car without assist ADL's / Homemaking Assistance Needed: Reports mod indep with ADLs Comments: Mourning loss of mother who passed 04/2017 who was pt's main support system Functional Status:  Mobility: Bed Mobility Overal bed mobility: Needs Assistance Bed Mobility: Rolling Supine to sit: Min assist, HOB elevated General bed mobility comments: MaxA+2 to roll R/L for lift pad placement in recliner; and again for pad removal while supine in bed. Pt very limited by L residual limb pain Transfers Overall transfer level: Needs assistance Equipment used: None Transfer via Lift Equipment: Maximove Transfers: Lateral/Scoot Transfers General transfer comment: Performed transfer from recliner to bed with maximove; required extra assist to hold LLE due to discomfort lifted in sling. Pt tolerated ok  ADL:  Cognition: Cognition Overall Cognitive Status: Within Functional Limits for tasks assessed Orientation Level: Oriented X4 Cognition Arousal/Alertness: Awake/alert Behavior During Therapy: WFL for tasks assessed/performed Overall Cognitive Status: Within Functional Limits for tasks assessed General Comments: Distracted by pain   Blood pressure 118/72, pulse 100, temperature 98.5 F (36.9 C), temperature source Oral, resp. rate 16, height 5\' 10"  (1.778 m), weight (!) 158.5 kg, last menstrual period 02/19/2018, SpO2 98 %. Physical Exam  Nursing note and vitals  reviewed. Constitutional: She is oriented to person, place, and time. She appears well-developed and well-nourished.  Morbidly obese  Eyes: Pupils are equal, round, and reactive to light. EOM are normal.  Neck: Normal range of motion.  Cardiovascular: Normal rate.  Respiratory: Effort normal.  GI: Soft.  Musculoskeletal: She exhibits edema.  L-BKA with compressive wrap and moderate edema left thigh. EXTREMELY TENDER to touch and simple manipulation. Right foot with 1+ edema and callused ulcer on plantar surface of great toe. Small healed lesions RLE. Sensory deficits RLE.   Neurological: She is alert and oriented to person, place, and time.  UE 5/5 (very strong). RLE 3+ to 4+/5 prox to distal. LLE: unable to move due to pain/swelling  Psychiatric: She has a normal mood and affect. Her behavior is normal.          Assessment/Plan: Diagnosis: Left lower ext myositis/sepsis, hx of left BKA 1. Does the need for close, 24 hr/day medical supervision in concert with the patient's rehab needs make it unreasonable for this patient to be served in a less intensive setting? Yes and Potentially 2. Co-Morbidities requiring supervision/potential complications: pain, wound care, ID mgt, morbid obesity 3. Due to bladder management, bowel management, safety, skin/wound care, disease management, medication administration, pain management and patient education, does the patient require 24 hr/day rehab nursing? Yes 4. Does the patient require coordinated care of a physician, rehab nurse, PT (1-2 hrs/day, 5 days/week) and OT (1-2 hrs/day, 5 days/week) to address physical and functional deficits in the context of the above medical diagnosis(es)? Yes and Potentially Addressing deficits in the following areas: balance, endurance, locomotion, strength, transferring, bowel/bladder control, bathing, dressing, feeding, grooming, toileting and psychosocial support 5. Can the patient actively participate in an intensive  therapy program of at least 3 hrs of therapy per day at least 5 days per week? Potentially 6. The potential for patient to make measurable gains while on inpatient rehab is good and fair 7. Anticipated functional outcomes upon discharge from inpatient rehab are modified independent  with PT, modified independent and supervision with OT, n/a with SLP. 8. Estimated rehab length of  stay to reach the above functional goals is: potentially 14-18 days 9. Anticipated D/C setting: Home 10. Anticipated post D/C treatments: HH therapy and Outpatient therapy 11. Overall Rehab/Functional Prognosis: excellent  RECOMMENDATIONS: This patient's condition is appropriate for continued rehabilitative care in the following setting: CIR, eventually Patient has agreed to participate in recommended program. Yes Note that insurance prior authorization may be required for reimbursement for recommended care.  Comment: Patient is hardly able to tolerate bed mobility due to pain. Will follow for increased activity tolerance and improved pain control. Rehab Admissions Coordinator to follow up.  Thanks,  Meredith Staggers, MD, Mellody Drown  I have personally performed a face to face diagnostic evaluation of this patient. Additionally, I have reviewed and concur with the physician assistant's documentation above.    Bary Leriche, PA-C 04/06/2018        Revision History              Routing History

## 2018-04-11 NOTE — Progress Notes (Signed)
Occupational Therapy Session Note  Patient Details  Name: [name] [name] MRN: [id number] Date of Birth: 1976-04-26  Today's Date: 04/11/2018 OT Individual Time: 1100-1158 OT Individual Time Calculation (min): 58 min    Short Term Goals: Week 1:  OT Short Term Goal 1 (Week 1): Pt will don pants with mod A +1 OT Short Term Goal 2 (Week 1): Pt will don shirt sitting EOB with (S) for improved sitting balance OT Short Term Goal 3 (Week 1): Pt will completed squat pivot to w/c with max +1  Skilled Therapeutic Interventions/Progress Updates:    Patient seated in w/c, is pleasant and cooperative.  Reviewed skin care, sensation, weight shifts - patient demonstrates a good understanding.  Completed w/c push ups with moderate difficulty and endurance deficit noted.   UE conditioning exercises with focus on sitting balance and core strength - mild endurance deficit noted.  Completed sit to stand from w/c with left side rail/grab bar CG/min A x3 - able to stand for 10-30 seconds - on 4th attempt she was unable to get fully upright due to fatigue.  Reviewed conditioning activities that she can complete on her own during non-therapy time.   Patient states that she is happy with her ability to stand today.  She remained in w/c with lunch tray at close of session.  Therapy Documentation Precautions:  Precautions Precautions: Fall Restrictions Weight Bearing Restrictions: Yes RLE Weight Bearing: Partial weight bearing LLE Weight Bearing: Non weight bearing General: General Chart Reviewed: Yes Family/Caregiver Present: No Vital Signs:  Pain: Pain Assessment Pain Scale: 0-10 Pain Score: 1  Pain Type: Acute pain Pain Location: Leg Pain Orientation: Left Pain Descriptors / Indicators: Aching Pain Onset: On-going Pain Intervention(s): Repositioned ADL:     Therapy/Group: Individual Therapy  Carlos Levering 04/11/2018, 12:48 PM

## 2018-04-11 NOTE — Progress Notes (Signed)
Patient refused miralax and senna because of multiple lose BM. Unable to collect stool for hemoccult because her stool was very watery.

## 2018-04-11 NOTE — Evaluation (Signed)
Physical Therapy Assessment and Plan  Patient Details  Name: [name] [name] MRN: [id number] Date of Birth: 1975/07/08  PT Diagnosis: Contracture of joint: L knee, Difficulty walking, Edema, Impaired sensation and Muscle weakness Rehab Potential: Good ELOS: 14-17   Today's Date: 04/11/2018 PT Individual Time:0800  -  0915, 75 min individual tx Problem List:  Patient Active Problem List   Diagnosis Date Noted  . Debility 04/10/2018  . Bacteremia due to group B Streptococcus 04/04/2018  . Severe protein-calorie malnutrition ([place])   . BKA stump complication ([place]) 83/38/2505  . Vaginal candidiasis 03/12/2018  . Herpes genitalis in women 01/31/2018  . Herpes simplex type 1 infection 01/16/2018  . Status post below knee amputation, left ([place]) 04/11/2017  . Bipolar affective disorder ([place])   . Adjustment disorder with depressed mood 03/26/2017  . Abnormal ECG 03/24/2017  . Sepsis ([place]) 03/24/2017  . Heart failure with preserved ejection fraction ([place]) 02/09/2017  . Routine adult health maintenance 12/08/2016  . Chronic pain of right knee 09/08/2016  . Chronic kidney disease (CKD), stage III (moderate) ([place]) 07/15/2016  . OSA (obstructive sleep apnea) 05/13/2016  . Morbid obesity due to excess calories ([place]) 11/27/2015  . History of Low serum cortisol level ([place]) 01/07/2015  . Hypothyroidism 04/25/2013  . Financial difficulty 04/25/2013  . Acute renal failure ([place]) 07/12/2012  . Uncontrolled type 2 diabetes mellitus ([place]) 07/12/2012  . Necrotizing fasciitis s/p OR debridements 07/06/2012  . Carpal tunnel syndrome, bilateral 01/25/2012  . Diabetic peripheral neuropathy ([place]) 07/04/2011  . Anxiety and depression 06/02/2011  . History of abnormal cervical Pap smear 12/13/2007  . GERD 12/06/2007  . Hyperlipidemia 08/29/2007  . Hypertension associated with diabetes ([place]) 08/29/2007    Past Medical History:  Past Medical History:  Diagnosis Date  . Abdominal muscle pain  09/08/2016  . Abnormal Pap smear of cervix 2009  . Abscess    history of multiple abscesses  . Acute bilateral low back pain 02/13/2017  . Acute blood loss anemia   . Anemia of chronic disease 2002  . Anxiety    Panic attacks  . Bilateral lower extremity edema 05/13/2016  . Bipolar disorder ([place])   . Cellulitis 05/21/2014   right eye  . Chronic bronchitis ([place])    "get it q yr" (05/13/2013)  . Chronic pain   . Depression   . Edema of lower extremity   . Endocarditis 2002   subacute bacterial endocarditis.   . Family history of anesthesia complication    "my mom has a hard time coming out from under"  . Fibromyalgia   . GERD (gastroesophageal reflux disease)    occ  . Heart murmur   . History of blood transfusion    "just low blood count" (05/13/2013)  . Hyperlipidemia   . Hypertension   . Hypothyroidism   . Hypothyroidism, adult 03/21/2014  . Leukocytosis   . Necrosis ([place])    and ulceration  . Obesity   . OSA on CPAP    does not wear CPAP  . Peripheral neuropathy   . Type II diabetes mellitus ([place])    Type  II   Past Surgical History:  Past Surgical History:  Procedure Laterality Date  . AMPUTATION Left 04/07/2017   Procedure: LEFT BELOW KNEE AMPUTATION;  Surgeon: Newt Minion, MD;  Location: [place];  Service: Orthopedics;  Laterality: Left;  . EYE SURGERY     [place]  . INCISION AND DRAINAGE ABSCESS     multiple I&Ds  .  INCISION AND DRAINAGE ABSCESS Left 07/09/2012   Procedure: DRESSING CHANGE, THIGH WOUND;  Surgeon: Harl Bowie, MD;  Location: [facility];  Service: General;  Laterality: Left;  . INCISION AND DRAINAGE OF WOUND Left 07/07/2012   Procedure: IRRIGATION AND DEBRIDEMENT WOUND;  Surgeon: Harl Bowie, MD;  Location: [place];  Service: General;  Laterality: Left;  . INCISION AND DRAINAGE PERIRECTAL ABSCESS Left 07/14/2012   Procedure: DEBRIDEMENT OF SKIN & SOFT TISSUE; DRESSING CHANGE UNDER ANESTHESIA;  Surgeon: Gayland Curry, MD,FACS;  Location: [place] [facility];  Service: General;  Laterality: Left;  . INCISION AND DRAINAGE PERIRECTAL ABSCESS Left 07/16/2012   Procedure: I&D Left Thigh;  Surgeon: Gwenyth Ober, MD;  Location: [facility];  Service: General;  Laterality: Left;  . INCISION AND DRAINAGE PERIRECTAL ABSCESS N/A 01/05/2015   Procedure: IRRIGATION AND DEBRIDEMENT PERIRECTAL ABSCESS;  Surgeon: Donnie Mesa, MD;  Location: [place];  Service: General;  Laterality: N/A;  . IRRIGATION AND DEBRIDEMENT ABSCESS Left 07/06/2012   Procedure: IRRIGATION AND DEBRIDEMENT ABSCESS BUTTOCKS AND THIGH;  Surgeon: Shann Medal, MD;  Location: [place];  Service: General;  Laterality: Left;  . IRRIGATION AND DEBRIDEMENT ABSCESS Left 08/10/2012   Procedure: IRRIGATION AND DEBRIDEMENT ABSCESS;  Surgeon: Madilyn Hook, DO;  Location: [place];  Service: General;  Laterality: Left;  . STUMP REVISION Left 06/09/2017   Procedure: REVISION LEFT BELOW KNEE AMPUTATION;  Surgeon: Newt Minion, MD;  Location: [facility];  Service: Orthopedics;  Laterality: Left;  . TONSILLECTOMY  1994    Assessment & Plan Clinical Impression: [name] a 70 y.owith history of T2DM, OSA, CKD, bipolar disorder, morbid obesity--BMI 50, ongoing tobacco use, left BKA--CIR 03/2018 with recent falls and increasing edema and pain in left arm. She was admitted on 04/03/2018 with fever due to sepsis from group B bacteremia. She was started on broad-spectrum antibiotics and MRI of left lower extremity done showing generalized soft tissue edema and distal thigh with severe muscle edema and residual gastrocnemius question infectious versus inflammatory myositis. Dr. Sharol Given felt that there was no need for surgical intervention and antibiotics narrowed to ceftriaxone with recommendations to complete 2 weeks antibiotic course--end date 11/28. Patient has had issues with nausea with poor appetite,acute on chronic anemia requiring transfusion with 1 units,acute on chronic renal failure treated with IV  fluids,poorly controlled blood sugars as well as issues with constipation Patient transferred to CIR on 04/10/2018 .   Patient currently requires total with mobility secondary to muscle weakness and muscle joint tightness, decreased cardiorespiratoy endurance and decreased sitting balance, decreased standing balance and decreased balance strategies.  Prior to hospitalization, patient was modified independent  with mobility and lived with Family in a House home.  Home access is 1(per pt)Stairs to enter, no rails.  Pt was unable to explain how she got in/out of house.  Patient will benefit from skilled PT intervention to maximize safe functional mobility, minimize fall risk and decrease caregiver burden for planned discharge home with intermittent assist.  Anticipate patient will benefit from follow up [facility] at discharge.  PT - End of Session Activity Tolerance: Tolerates 10 - 20 min activity with multiple rests Endurance Deficit: Yes Endurance Deficit Description: generalized weakness; fatigued after rolling PT Assessment Rehab Potential (ACUTE/IP ONLY): Good PT Barriers to Discharge: Inaccessible home environment(pt has 1 STE, no rails) PT Barriers to Discharge Comments: after previous CIR stay, pt did not have ramp installed PT Patient demonstrates impairments in the following area(s): Balance;Edema;Endurance;Motor;Sensory PT Transfers  Functional Problem(s): Bed Mobility;Bed to Chair;Furniture PT Locomotion Functional Problem(s): Wheelchair Mobility;Stairs PT Plan PT Intensity: Minimum of 1-2 x/day ,45 to 90 minutes PT Frequency: 5 out of 7 days PT Duration Estimated Length of Stay: 14-17 PT Treatment/Interventions: Ambulation/gait training;Community reintegration;DME/adaptive equipment instruction;Neuromuscular re-education;Psychosocial support;Stair training;UE/LE Strength taining/ROM;Wheelchair propulsion/positioning;Balance/vestibular training;Discharge planning;Functional electrical  stimulation;Pain management;Therapeutic Activities;UE/LE Coordination activities;Functional mobility training;Patient/family education;Splinting/orthotics;Therapeutic Exercise PT Transfers Anticipated Outcome(s): supervision basic, min assist car PT Locomotion Anticipated Outcome(s): supervision w/c propulsion x 150'; gait and stairs TBD PT Recommendation Recommendations for Other Services: Neuropsych consult(grieving for mother who died approx 1 yr ago, and was pt's main support) Follow Up Recommendations: Home health PT Patient destination: Home Equipment Recommended: To be determined Equipment Details: pt reported that amputee pad and footrest of manual w/c are missing; RW was broken in a fall and repaired in some way, and prosthesis socket is too large  Skilled Therapeutic Intervention Pt lying in bed.  Pt noted to have on wet brief, which did not fit around her well.  She participated in rolling, use of wet washcloths to cleanse herself in the front.  All bed mobility limited by residual limb pain.  PT donned dry brief.  Pt reported that she had been contacting [facility] recently to try to get prosthesis fitted better; socket was too big and pt did not wear it often.  Attempted sit> stand with Janett Billow, NT , using bariatric Stedy, which was unsuccessful due to pt's body habitus.  PT obtained 24' wide w/c with L amputee pad and R elevating legrest, and left w/c in room. PT explained ELOS, LTGs and therapy schedule.  PT strongly recommended pt try to see if she has any resource for installing a ramp.  Pt left resting in bed with alarm set, needs at hand.  PT Evaluation Precautions/Restrictions Restrictions Weight Bearing Restrictions: No  Pain 9/10 distal end of residual limb; medicated during session   Home Living/Prior Functioning Home Living Available Help at Discharge: Family;Available PRN/intermittently Type of Home: House Home Access: Stairs to enter [employer] of  Steps: 1(per pt) Entrance Stairs-Rails: None(per pt; chart reports 2 steps 2 rails) Home Layout: One level Bathroom Shower/Tub: Tub/shower unit;Curtain Biochemist, clinical: Standard Bathroom Accessibility: Yes Additional Comments: Ramp was never built; pt navigates stairs into home. (pt unable to explain how she entered house without RW) Prior Function Level of Independence: Independent with transfers  Able to Take Stairs?: Yes Driving: No Vocation: On disability Leisure: Hobbies-yes (Comment)(crafts, use of Ipad) Comments: Mourning loss of mother who passed 04/2017 who was pt's main support system Vision/Perception - pt wears reading glasses; she does not have them here    Cognition- A and O x 4; follows commands   Sensation Sensation Light Touch: Impaired Detail Peripheral sensation comments: absent anterior lower leg, lateral foot Proprioception: Appears Intact(at ankle) Coordination Gross Motor Movements are Fluid and Coordinated: No Fine Motor Movements are Fluid and Coordinated: No Heel Shin Test: NT Motor  Motor Motor: Other (comment) Motor - Skilled Clinical Observations: generalized weakness  Mobility Bed Mobility Bed Mobility: Rolling Right;Rolling Left;Right Sidelying to Sit Rolling Right: Independent with assistive device Rolling Left: Independent with assistive device Right Sidelying to Sit: Supervision/Verbal cueing Transfers Transfers: (attempted bariatric Stedy, but pt's hips too wide) Locomotion  Gait Ambulation: No Gait Gait: No Stairs / Additional Locomotion Stairs: No Wheelchair Mobility Wheelchair Mobility: No  Trunk/Postural Assessment  Cervical Assessment Cervical Assessment: Within Functional Limits Thoracic Assessment Thoracic Assessment: Within Functional Limits Lumbar Assessment Lumbar Assessment: Within  Functional Limits Postural Control Postural Control: Within Functional Limits  Balance Balance Balance Assessed: Yes Static Sitting  Balance Static Sitting - Balance Support: Feet unsupported Static Sitting - Level of Assistance: 5: Stand by assistance Dynamic Sitting Balance Dynamic Sitting - Balance Support: Feet unsupported Dynamic Sitting - Level of Assistance: 5: Stand by assistance Static Standing Balance Static Standing - Level of Assistance: Not tested (comment) Extremity Assessment      RLE Assessment RLE Assessment: Exceptions to [facility] Passive Range of Motion (PROM) Comments:  ankle DF approx 5 degrees, limited by muscle tightness and edema General Strength Comments: grossly in sitting: hip flexion 2+/5, knee extension 3-/5, ankle DF 3-/5 LLE Assessment LLE Assessment: Exceptions to WFL(moderate/severe non pitting edema) Active Range of Motion (AROM) Comments: lacking extension approx 20 degrees General Strength Comments: grossly in supine: hip 4-/5, knee 4/5    Refer to Care Plan for Long Term Goals  Recommendations for other services: Neuropsych  Discharge Criteria: Patient will be discharged from PT if patient refuses treatment 3 consecutive times without medical reason, if treatment goals not met, if there is a change in medical status, if patient makes no progress towards goals or if patient is discharged from hospital.  The above assessment, treatment plan, treatment alternatives and goals were discussed and mutually agreed upon: by patient  Alverda Nazzaro 04/11/2018, 5:05 PM

## 2018-04-11 NOTE — Consult Note (Signed)
            [facility] CM Primary Care Navigator  04/11/2018  [name] [name] 09/09/1975 [id number]   Attemptto see patient at the bedside to identify possible discharge needs but shehad been transferredfrom 4E 26 to [facility] (CIR- 4W 04).  Per MD note, patient was admitted with a severe infection of the left stump, sepsis in a woman with multiple co-morbidities. (Group B strep bacteremia, sepsis, uncontrolled DM II, CKD- chronic kidney disease stage 2, acute on chronic diastolic heart failure, anemia of chronic disease) .  Patient has discharge instruction to follow-up with primary care provider follow-up in 1 week after inpatient rehab discharge and orthopedic surgery in 1 week.  Providers from primary care physicians' office had been following patient during this admission.  Primary care provider's office is listed as providing transition of care (TOC) follow-up.  Primary care provider's officewascalled (spoke to [place]) to notifyofpatient'sdischarge disposition and to informof health issues needing close follow-up (mainlyDM- A1c of 15.5; HF). Was informed that patient needs all the necessary encouragement and help that she could get since she is not a very  compliant patient. Made aware to refer patient to [facility] care management if deemed necessary and appropriate foranyservices. Bonnita Nasuti was grateful for the call and will relay information to primary care provider.   For additional questions please contact:  Edwena Felty A. Arion Morgan, BSN, RN-BC [facility] PRIMARY CARE Navigator Cell: [phone]

## 2018-04-11 NOTE — Progress Notes (Signed)
Inpatient Rehabilitation  Patient information reviewed and entered into eRehab system by [facility]. Loni Beckwith., CCC/SLP, PPS Coordinator.  Information including medical coding, functional ability and quality indicators will be reviewed and updated through discharge.    Per nursing patient was given "Data Collection Information Summary for Patients in Inpatient Rehabilitation Facilities with attached "Privacy Act [place] Records" upon admission, present in education notebook.

## 2018-04-11 NOTE — Progress Notes (Signed)
Retta Diones, RN  Rehab Admission Coordinator  Physical Medicine and Rehabilitation  PMR Pre-admission  Signed  Date of Service:  04/10/2018 12:47 PM       Related encounter: ED to Hosp-Admission (Discharged) from 04/03/2018 in [place]      Signed         Show:Clear all [x] Manual[x] Template[x] Copied  Added by: [x] Retta Diones, RN  [] Hover for details PMR Admission Coordinator Pre-Admission Assessment  Patient: [name] [name] is an 42 y.o., female MRN: [id number] DOB: August 25, 1975 Height: 5\' 10"  (177.8 cm) Weight: (!) 153.8 kg                                                                                                                                                  Insurance Information HMO: No   PPO:       PCP:       IPA:       80/20:       OTHER:   PRIMARY:  Medicare A and B      Policy#: [id number]      Subscriber: patient CM Name:        Phone#:       Fax#:   Pre-Cert#:        Employer: Disabled Benefits:  Phone #:       Name: Checked in [place]. Date: 09/20/13     Deduct:  $1364      Out of Pocket Max: None      Life Max: N/A CIR: 100%      SNF: 100 days Outpatient: 80%     Co-Pay: 20% Home Health: 100%      Co-Pay: none DME: 80%     Co-Pay: 20% Providers: patient's choice  SECONDARY: Medicaid of [place]      Policy#: [id number]      Subscriber: patient CM Name:        Phone#:       Fax#:   Pre-Cert#:        Employer: Disabled Benefits:  Phone #: [phone]     Name:   Eff. Date: eligible 04/10/18 with coverage code [id number]     Deduct:        Out of Pocket Max:        Life Max:   CIR:        SNF:   Outpatient:       Co-Pay:   Home Health:        Co-Pay:   DME:       Co-Pay:    Medicaid Application Date:        Case Manager:   Disability Application Date:        Case Worker:    Emergency Publishing copy Information  Name Relation Home Work Mobile    Wanship Sister   [phone]     Current Medical History  Patient Admitting Diagnosis: Left lower ext myositis/sepsis, hx of left BKA  History of Present Illness: A 42 y.o.femalewith history of T2DM, OSA,CKD,bipolar disorder, anxiety disorder,morbid obesity--BMI 50,ongoing tobacco use,left BKA (CIR 03/2017) who has had falls with increase inedema and painleft stump. Shewho was admitted on 04/03/2018 withfever due tosepsisfromgroup B bacteremia.She was started on broad-spectrum IV antibiotics for treatment as well as IV Dilaudid for pain control MRI of LLEshowedgeneralized soft tissue edema in distal thigh,severe muscle edemain residual gastrocnemiusquestion infectious or due to inflammatory myositis. Dr. Sharol Given felt that there was noneed for surgical interventionand antibiotics narrowed to ceftriaxone with recommendations to complete 2 weeks course. She has had issues with nausea with poor appetite as well as pain with deficits in mobility. Therapy evaluations done yesterday and CIR was recommended for follow-up therapy   Past Medical History      Past Medical History:  Diagnosis Date  . Abdominal muscle pain 09/08/2016  . Abnormal Pap smear of cervix 2009  . Abscess    history of multiple abscesses  . Acute bilateral low back pain 02/13/2017  . Acute blood loss anemia   . Anemia of chronic disease 2002  . Anxiety    Panic attacks  . Bilateral lower extremity edema 05/13/2016  . Bipolar disorder ([place])   . Cellulitis 05/21/2014   right eye  . Chronic bronchitis ([place])    "get it q yr" (05/13/2013)  . Chronic pain   . Depression   . Edema of lower extremity   . Endocarditis 2002   subacute bacterial endocarditis.   . Family history of anesthesia complication    "my mom has a hard time coming out from under"  . Fibromyalgia   . GERD (gastroesophageal reflux disease)    occ  . Heart murmur   . History of blood transfusion     "just low blood count" (05/13/2013)  . Hyperlipidemia   . Hypertension   . Hypothyroidism   . Hypothyroidism, adult 03/21/2014  . Leukocytosis   . Necrosis ([place])    and ulceration  . Obesity   . OSA on CPAP    does not wear CPAP  . Peripheral neuropathy   . Type II diabetes mellitus (HCC)    Type  II    Family History  family history includes Diabetes in her father, mother, and paternal grandmother; Heart failure in her mother and paternal grandmother; Kidney disease in her father and mother; Other in her other.  Prior Rehab/Hospitalizations: Had [facility] after amputation 1 yr ago.  Went to a SNF on [address] for a week, but does not want to go there again.  Has the patient had major surgery during 100 days prior to admission? No  Current Medications   Current Facility-Administered Medications:  .  0.9 %  sodium chloride infusion (Manually program via Guardrails IV Fluids), , Intravenous, Once, Santos-Sanchez, Idalys, MD .  0.9 %  sodium chloride infusion, , Intravenous, Continuous, Pollina, Gwenyth Allegra, MD, Last Rate: 10 mL/hr at 04/10/18 1046 .  0.9 %  sodium chloride infusion, , Intravenous, PRN, Axel Filler, MD, Last Rate: 10 mL/hr at 04/03/18 1713, 500 mL at 04/03/18 1713 .  acetaminophen (TYLENOL) tablet 650 mg, 650 mg, Oral, Q6H PRN, 650 mg at 04/05/18 0415 **OR** acetaminophen (TYLENOL) suppository 650 mg, 650 mg, Rectal, Q6H PRN, Hoffman, Jessica Ratliff, DO, 650 mg at 04/03/18 1706 .  baclofen (LIORESAL) tablet 10 mg, 10 mg, Oral, TID, Seawell, Jaimie A, DO, 10 mg at 04/10/18 1118 .  cefTRIAXone (ROCEPHIN) 2 g in sodium chloride 0.9 % 100 mL IVPB, 2 g, Intravenous, Q24H, Annia Belt, MD, Last Rate: 200 mL/hr at 04/09/18 1213, 2 g at 04/09/18 1213 .  chlorhexidine (HIBICLENS) 4 % liquid 4 application, 60 mL, Topical, Once, Rayburn, Shawn Montgomery, PA-C .  diclofenac sodium (VOLTAREN) 1 % transdermal gel 4 g, 4 g, Topical, QID, Seawell,  Jaimie A, DO, 4 g at 04/10/18 1121 .  enoxaparin (LOVENOX) injection 80 mg, 80 mg, Subcutaneous, Q24H, Corinda Gubler, RPH, 80 mg at 04/10/18 1117 .  feeding supplement (PRO-STAT SUGAR FREE 64) liquid 30 mL, 30 mL, Oral, BID, Axel Filler, MD, 30 mL at 04/10/18 1118 .  FLUoxetine (PROZAC) 20 MG/5ML solution 60 mg, 60 mg, Oral, Daily, Santos-Sanchez, Idalys, MD, 60 mg at 04/10/18 1118 .  hydrocortisone cream 1 %, , Topical, PRN, Annia Belt, MD, 1 application at 81/82/99 0425 .  insulin aspart (novoLOG) injection 0-15 Units, 0-15 Units, Subcutaneous, TID WC, Seawell, Jaimie A, DO .  insulin aspart (novoLOG) injection 0-5 Units, 0-5 Units, Subcutaneous, QHS, Valinda Party, DO, 2 Units at 04/05/18 2217 .  insulin glargine (LANTUS) injection 20 Units, 20 Units, Subcutaneous, BID, Santos-Sanchez, Idalys, MD, 20 Units at 04/10/18 1117 .  metoCLOPramide (REGLAN) tablet 5 mg, 5 mg, Oral, Q8H PRN, Seawell, Jaimie A, DO .  oxyCODONE-acetaminophen (PERCOCET/ROXICET) 5-325 MG per tablet 2 tablet, 2 tablet, Oral, Q6H PRN, Welford Roche, MD, 2 tablet at 04/09/18 2305 .  polyethylene glycol (MIRALAX / GLYCOLAX) packet 17 g, 17 g, Oral, Daily, Seawell, Jaimie A, DO, 17 g at 04/10/18 1117 .  senna-docusate (Senokot-S) tablet 2 tablet, 2 tablet, Oral, BID, Santos-Sanchez, Idalys, MD, 2 tablet at 04/10/18 1118 .  sodium chloride flush (NS) 0.9 % injection 10-40 mL, 10-40 mL, Intracatheter, PRN, Annia Belt, MD, 10 mL at 04/10/18 0403  Patients Current Diet:     Diet Order                  Diet heart healthy/carb modified Room service appropriate? Yes; Fluid consistency: Thin  Diet effective now               Precautions / Restrictions Precautions Precautions: Fall Restrictions Weight Bearing Restrictions: Yes RLE Weight Bearing: Partial weight bearing   Has the patient had 2 or more falls or a fall with injury in the past year?No  Prior  Activity Level Limited Community (1-2x/wk): Went out 2 X a week, used transportation through [employer]  Home Assistive Devices / Equipment Home Assistive Devices/Equipment: Wheelchair Home Equipment: Environmental consultant - 2 wheels, Wheelchair - manual  Prior Device Use: Indicate devices/aids used by the patient prior to current illness, exacerbation or injury? Manual wheelchair, Walker and prosthesis for left lower leg.  Prior Functional Level Prior Function Level of Independence: Independent with assistive device(s) Gait / Transfers Assistance Needed: Uses RW a good bit around the house without prosthetic; recently got prosthetic, but pt reports too big, so she only wears it in community. Mod indep with w/c mobility. Ascends/descends steps into home without assist. Able to get w/c into and out of car without assist ADL's / Homemaking Assistance Needed: Reports mod indep with ADLs Comments: Mourning loss of mother who passed 04/2017 who was pt's main support system  Self Care: Did the patient need help bathing, dressing, using the toilet or eating?  Independent  Indoor Mobility: Did the patient need assistance with walking from room to room (with or without device)? Independent  Stairs: Did the patient need assistance with internal or external stairs (with or without device)? Independent  Functional Cognition: Did the patient need help planning regular tasks such as shopping or remembering to take medications? Independent  Current Functional Level Cognition  Overall Cognitive Status: Within Functional Limits for tasks assessed Orientation Level: Oriented X4 General Comments: distracted by pain    Extremity Assessment (includes Sensation/Coordination)  Upper Extremity Assessment: Generalized weakness  Lower Extremity Assessment: RLE deficits/detail, LLE deficits/detail RLE Deficits / Details: Pt reports "I cannot feel below my belly" (h/o peripheral neuropathy; light touch in tact to  at least knee level); grossly 3/5 throughout RLE Sensation: decreased light touch, history of peripheral neuropathy RLE Coordination: decreased fine motor, decreased gross motor LLE Deficits / Details: L residual limb <3/5; limited by severe pain; pt report numbness as well LLE: Unable to fully assess due to pain LLE Sensation: history of peripheral neuropathy, decreased light touch LLE Coordination: decreased fine motor, decreased gross motor    ADLs  Overall ADL's : Needs assistance/impaired Eating/Feeding: Independent Grooming: Wash/dry hands, Wash/dry face, Supervision/safety, Set up, Sitting Upper Body Bathing: Supervision/ safety, Set up, Sitting Lower Body Bathing: Maximal assistance, Bed level Upper Body Dressing : Set up, Minimal assistance, Sitting Lower Body Dressing: Total assistance, Bed level General ADL Comments: PATIENT IS REQUIRING EXTENSIVE ASSIST . PATIENT PERFOMRED ADLS BED LEVEL SECONDARY TO PAIN AND REQUIRING 2 PERSON ASSIT FOR BED MOBILITY AND WITH LIFT FOR TRANSFERS    Mobility  Overal bed mobility: Needs Assistance Bed Mobility: Supine to Sit Supine to sit: Min guard General bed mobility comments: min guard for safety; increased time and effort; use of rails to get into long sitting     Transfers  Overall transfer level: Needs assistance Equipment used: Sliding board, Rolling walker (2 wheeled) Transfer via Lift Equipment: Stedy Transfers: Lateral/Scoot Transfers, Sit to/from Stand Sit to Stand: Total assist, +2 physical assistance, From elevated surface  Lateral/Scoot Transfers: +2 physical assistance, Max assist, With slide board General transfer comment: attempted sit to stand from elevated bed height with RW then Stedy standing frame but unsuccessful; pt able to laterally scoot with use of slide board to drop arm recliner with max A +2 and max cues for anterior translation of trunk and sequencing with R knee blocked; pt has tendency for posterior lean  while transfering    Ambulation / Gait / Stairs / Wheelchair Mobility       Posture / Balance Balance Overall balance assessment: Needs assistance Sitting-balance support: Bilateral upper extremity supported, Feet supported Sitting balance-Leahy Scale: Poor Standing balance-Leahy Scale: Zero    Special needs/care consideration BiPAP/CPAP No CPM No Continuous Drip IV Currently receiving a unit of blood 04/10/18 Dialysis No   Life Vest No Oxygen No Special Bed No Trach Size No Wound Vac (area) No    Skin Has an ace wrap to left leg amputation stump                           Bowel mgmt: Last BM11/16/19 Bladder mgmt: Has a purwick external catheter in place Diabetic mgmt Yes, on insulin at home, but has been non compliant with medical care this past year since her mother's death on 2017/05/17    Previous Home Environment Living Arrangements: Other relatives Available Help at Discharge: Family, Available PRN/intermittently Type of Home:  House Home Layout: One level Home Access: Stairs to enter Entrance Stairs-Rails: Right, Left Entrance Stairs-Number of Steps: 2 Bathroom Shower/Tub: Tub/shower unit, Architectural technologist: Standard Bathroom Accessibility: Yes Home Care Services: No Additional Comments: Ramp was never built; pt navigates stairs into home.   Discharge Living Setting Plans for Discharge Living Setting: House, Lives with (comment)(Her 47 yo nephew lives with her.) Type of Home at Discharge: House Discharge Home Layout: One level Discharge Home Access: Stairs to enter Entrance Stairs-Number of Steps: 1 at the front entry and 3 at the side entry.  Uses front entry. Discharge Bathroom Shower/Tub: Tub/shower unit, Curtain Discharge Bathroom Toilet: Standard Discharge Bathroom Accessibility: Yes How Accessible: Accessible via walker Does the patient have any problems obtaining your medications?: No  Social/Family/Support Systems Patient Roles: Other  (Comment)(Has a sister and a nephew) Contact Information: Firefighter - nephew Anticipated Caregiver: Nephew and Personal care attendents Anticipated Caregiver's Contact Information: Glenda Chroman - nephew - [phone] Ability/Limitations of Caregiver: Alanson Puls is currently looking for a job but is unemployed.  Has a sister.  No children.  Has Personal Care assistant daily for about 2 hours a day Caregiver Availability: Intermittent Discharge Plan Discussed with Primary Caregiver: Yes Is Caregiver In Agreement with Plan?: Yes Does Caregiver/Family have Issues with Lodging/Transportation while Pt is in Rehab?: No  Goals/Additional Needs Patient/Family Goal for Rehab: PT mod I and OT mod I and supervision goals Expected length of stay: 14-18 days Cultural Considerations: Apostolic faith Dietary Needs: Heart Healthy, carb modified, thin liquids Equipment Needs: TBD Additional Information: Patient is grieving the loss of her mother from 05/14/17.  Patient has neglected herself since her mother's death.  She was tearful during our discussion today about her mom. Pt/Family Agrees to Admission and willing to participate: Yes Program Orientation Provided & Reviewed with Pt/Caregiver Including Roles  & Responsibilities: Yes  Decrease burden of Care through IP rehab admission: N/A  Possible need for SNF placement upon discharge: Not planned  Patient Condition: This patient's medical and functional status has changed since the consult dated: 04/06/18 in which the Rehabilitation Physician determined and documented that the patient's condition is appropriate for intensive rehabilitative care in an inpatient rehabilitation facility. See "History of Present Illness" (above) for medical update. Functional changes are: Currently requiring max to total assist + 2 for transfers.  She is tolerating up in chair and her pain is better controlled.  Patient's medical and functional status update has been discussed  with the Rehabilitation physician and patient remains appropriate for inpatient rehabilitation. Will admit to inpatient rehab today.  Preadmission Screen Completed By:  Retta Diones, 04/10/2018 1:01 PM ______________________________________________________________________   Discussed status with Dr. Naaman Plummer on 04/10/18 at 1259 and received telephone approval for admission today.  Admission Coordinator:  Retta Diones, time 1300/Date 04/10/18           Cosigned by: Meredith Staggers, MD at 04/10/2018 1:26 PM  Revision History

## 2018-04-11 NOTE — Progress Notes (Signed)
Patient refused CPAP, stated she would not wear again. Removed machine from patients room.

## 2018-04-12 ENCOUNTER — Inpatient Hospital Stay (HOSPITAL_COMMUNITY): Payer: Self-pay | Admitting: Physical Therapy

## 2018-04-12 ENCOUNTER — Inpatient Hospital Stay (HOSPITAL_COMMUNITY): Payer: Self-pay

## 2018-04-12 LAB — GLUCOSE, CAPILLARY
GLUCOSE-CAPILLARY: 135 mg/dL — AB (ref 70–99)
GLUCOSE-CAPILLARY: 144 mg/dL — AB (ref 70–99)
GLUCOSE-CAPILLARY: 67 mg/dL — AB (ref 70–99)
GLUCOSE-CAPILLARY: 73 mg/dL (ref 70–99)
Glucose-Capillary: 126 mg/dL — ABNORMAL HIGH (ref 70–99)

## 2018-04-12 LAB — OCCULT BLOOD X 1 CARD TO LAB, STOOL: Fecal Occult Bld: NEGATIVE

## 2018-04-12 NOTE — Significant Event (Signed)
Hypoglycemic Event  CBG: 67  Treatment: 15 GM carbohydrate snack  Symptoms: None  Follow-up CBG: Time:2219 CBG Result:73  Possible Reasons for Event: Unknown  Comments/MD notified: treated per protocol. Paged Marlowe Shores, PA orders to only give Lantus 10units.    [name] [name] A

## 2018-04-12 NOTE — Progress Notes (Signed)
[facility] PHYSICAL MEDICINE & REHABILITATION PROGRESS NOTE   Subjective/Complaints: Up with OT. Working through the pain. "trying to focus on therapy so that my mind is off the pain"  ROS: Patient denies fever, rash, sore throat, blurred vision, nausea, vomiting, diarrhea, cough, shortness of breath or chest pain, joint or back pain, headache, or mood change.   Objective:   No results found. Recent Labs    04/10/18 0356 04/11/18 0331  WBC 10.7* 11.8*  HGB 7.0* 7.6*  HCT 23.8* 26.9*  PLT 343 417*   Recent Labs    04/10/18 0356 04/11/18 0331  NA 137 136  K 4.5 4.6  CL 109 108  CO2 23 23  GLUCOSE 153* 191*  BUN 34* 29*  CREATININE 1.50* 1.23*  CALCIUM 8.3* 8.2*    Intake/Output Summary (Last 24 hours) at 04/12/2018 0835 Last data filed at 04/12/2018 0524 Gross per 24 hour  Intake 1900 ml  Output -  Net 1900 ml     Physical Exam: Vital Signs Blood pressure (!) 146/92, pulse 89, temperature 98.4 F (36.9 C), resp. rate 18, weight (!) 154.4 kg, SpO2 100 %. Constitutional: No distress . Vital signs reviewed. HEENT: EOMI, oral membranes moist Neck: supple Cardiovascular: RRR without murmur. No JVD    Respiratory: CTA Bilaterally without wheezes or rales. Normal effort    GI: BS +, non-tender, non-distended  Musculoskeletal: She exhibitsedema. Left leg tender, scar along incision. Tender to palpation.   Right foot with neuropathic ulcer under great toe--black edges with top layers sloughed off callus---stable Neurological: She isalertand oriented to person, place, and time. Nocranial nerve deficit. UE 5/5. LLE limited by pain. RLE 3-4/5 prox to distal. Decreased LT distally.  Cognitively intact Skin: She isnot diaphoretic.  Psychiatric:   Assessment/Plan: 1. Functional deficits secondary to LLE myositis which require 3+ hours per day of interdisciplinary therapy in a comprehensive inpatient rehab setting.  Physiatrist is providing close team  supervision and 24 hour management of active medical problems listed below.  Physiatrist and rehab team continue to assess barriers to discharge/monitor patient progress toward functional and medical goals  Care Tool:  Bathing    Body parts bathed by patient: Left arm, Right arm, Chest, Abdomen, Front perineal area, Face, Right upper leg, Left upper leg, Right lower leg   Body parts bathed by helper: Buttocks Body parts n/a: Left lower leg   Bathing assist Assist Level: Moderate Assistance - Patient 50 - 74%     Upper Body Dressing/Undressing Upper body dressing   What is the patient wearing?: Hospital gown only    Upper body assist Assist Level: Contact Guard/Touching assist    Lower Body Dressing/Undressing Lower body dressing      What is the patient wearing?: Incontinence brief     Lower body assist Assist for lower body dressing: Maximal Assistance - Patient 25 - 49%     Toileting Toileting    Toileting assist Assist for toileting: Maximal Assistance - Patient 25 - 49%     Transfers Chair/bed transfer  Transfers assist     Chair/bed transfer assist level: Maximal Assistance - Patient 25 - 49%     Locomotion Ambulation   Ambulation assist   Ambulation activity did not occur: Safety/medical concerns(unable due to weakness)          Walk 10 feet activity   Assist           Walk 50 feet activity   Assist  Walk 150 feet activity   Assist           Walk 10 feet on uneven surface  activity   Assist           Wheelchair     Assist               Wheelchair 50 feet with 2 turns activity    Assist            Wheelchair 150 feet activity     Assist           Medical Problem List and Plan: 1.Functional and mobility deficitssecondary to left lower extremity myositis/strep bacteremia -continue with CIR 2. DVT Prophylaxis/Anticoagulation:  Pharmaceutical:Lovenox 3.Chronic back pain/Management: Continue oxycodone as needed  -has a good outlook on dealing with her pain.  4. Mood:LCSW to follow for evaluation and support. Egosupport to be provided by team 5. Neuropsych: This patientiscapable of making decisions on herown behalf. 6. Skin/Wound Care:Routine pressure relief measures.    -pt aware of shifting, boosting 7. Fluids/Electrolytes/Nutrition:Monitor I's and O's.  -encourage PO intake  -I personally reviewed the patient's labs today.   8.Group B strep agalactiaebacteremia: Continue IV Ceftriaxone with end date 11/28  -wbc's stable, afebrile 11.8---recheck tomorrow 9.T2DM with neuropathy: We will monitor blood sugars AC at bedtime.     -titrated Lantus twice daily and for tighter control as needed.  -sugars under reasonable control at present 11/21 10.Chronic constipation: BM 11/19.  Will continue to augment back bowel program 11.HTN: Monitor blood pressures twice daily. Off Bumex at this time.  12.Peripheral edema: Monitor weights daily. -compression/elevation of LE's 13.Bipolar disorder with depression: Continue Prozac 14.Acute on chronic renal failure: SCr 2.72 at admission--->down to 1.23 11.21 with adequate hydration 15.Anemia of chronic disease/iron deficiency:H&H down to 7.0 23.8 on 11/19 but back up to 7.6 today after transfusion  -   stool guaiac negative.    - iron supplement.  16. Morbid obesiy-BMI-48.6: Educate patient on appropriate diet and importance of activity to help promote health and mobility.    LOS: 2 days A FACE TO FACE EVALUATION WAS PERFORMED  Meredith Staggers 04/12/2018, 8:35 AM

## 2018-04-12 NOTE — Progress Notes (Addendum)
Physical Therapy Session Note  Patient Details  Name: [name] [name] MRN: [id number] Date of Birth: 14-Feb-1976  Today's Date: 04/12/2018 PT Individual Time: 1030-1100 and 1325-1350 PT Individual Time Calculation (min): 30 min and 25 min  Short Term Goals: Week 1:  PT Short Term Goal 1 (Week 1): pt will transfer with assistance of 1 person bed>< w/c PT Short Term Goal 2 (Week 1): pt will propel w/c x 100' over level tile with supervision PT Short Term Goal 3 (Week 1): pt will participate in consultation with prosthetist regarding L prosthesis  Skilled Therapeutic Interventions/Progress Updates:    pt rec'd on BSC, attempting bowel movement.  Pt performs wt shifts with min A, total A for hygiene.  Pt fatigued and requires maxi move for transfer back to bed. Pt limited by pain and fatigue unable to squat pivot during session.  Pt left in bed with all needs at hand.  Session 2: pt c/o residual limb pain, repositioned to reduce pain.  UE therex with 5# dowel for shoulder and elbow strengthening in all planes.  Pt with good UE endurance.  W/c mobility with bilat UEs x 175' with cues only for brakes on/off.  Pt left in w/c with needs at hand.  Therapy Documentation Precautions:  Precautions Precautions: Fall Restrictions Weight Bearing Restrictions: Yes RLE Weight Bearing: Partial weight bearing LLE Weight Bearing: Non weight bearing Pain: Pain Assessment Pain Scale: 0-10 Pain Score: 7  Pain Type: Acute pain Pain Location: Leg Pain Orientation: Left Pain Descriptors / Indicators: Aching Pain Onset: On-going Pain Intervention(s): RN made aware   Therapy/Group: Individual Therapy  Sophiah Rolin 04/12/2018, 11:39 AM

## 2018-04-12 NOTE — Progress Notes (Signed)
Occupational Therapy Session Note  Patient Details  Name: [name] [name] MRN: [id number] Date of Birth: [birth year]  Today's Date: 04/12/2018 OT Individual Time: 0730-0830 Session 2: 1420-1500 OT Individual Time Calculation (min): 60 min Session 2: 40 min   Short Term Goals: Week 1:  OT Short Term Goal 1 (Week 1): Pt will don pants with mod A +1 OT Short Term Goal 2 (Week 1): Pt will don shirt sitting EOB with (S) for improved sitting balance OT Short Term Goal 3 (Week 1): Pt will complete squat pivot to w/c with max +1  Skilled Therapeutic Interventions/Progress Updates:    Session focused on b.d tasks at EOB and sit <> stand transfers. Pt transitioned to EOB with (S) and use of bed features. Pt completed UB bathing with set up assist. Pt completed sit to stand from EOB with bed elevated using RW with max A. Pt required heavy cueing for UE placement and upright posture. Total A provided for posterior peri cleansing in standing. Lotion was applied to pt's residual limb for pain management and skin integrity. Pt attempted stand pivot transfer but was unable to clear hips from bed again d/t fatigue. Pt completed lateral scoot to w/c with max +1 assist. Pt completed grooming at sink with set up. Pt left sitting up in w/c with all needs met.   Session 2: Session focused on ADL transfers and dynamic sitting balance with lateral leans. Pt propelled w/c 125 ft with increased time and cueing for technique. Pt used sliding board to transfer w/c <> mat with heavy support on w/c and board but pt able to move herself with no physical A. Pt completed lateral leans with cueing for technique and weight shift and then reached anteriorly with CGA. Pt returned to w/c and was left sitting up in room with all needs met.   Therapy Documentation Precautions:  Precautions Precautions: Fall Restrictions Weight Bearing Restrictions: No RLE Weight Bearing: Partial weight bearing LLE Weight Bearing: Non weight  bearing  Pain: Pain Assessment Pain Scale: 0-10 Pain Score: 7  Faces Pain Scale: Hurts a little bit Pain Type: Acute pain Pain Location: Leg Pain Orientation: Left Pain Descriptors / Indicators: Aching Pain Onset: On-going Pain Intervention(s): Distraction;Therapeutic touch PAINAD (Pain Assessment in Advanced Dementia) Breathing: normal Negative Vocalization: none Facial Expression: smiling or inexpressive Body Language: relaxed Consolability: no need to console PAINAD Score: 0   Therapy/Group: Individual Therapy  Curtis Sites 04/12/2018, 8:30 AM

## 2018-04-12 NOTE — Progress Notes (Signed)
Physical Therapy Session Note  Patient Details  Name: [name] [name] MRN: [id number] Date of Birth: [birth year]  Today's Date: 04/12/2018 PT Individual Time: 9379-0240 PT Individual Time Calculation (min): 28 min   Short Term Goals: Week 1:  PT Short Term Goal 1 (Week 1): pt will transfer with assistance of 1 person bed>< w/c PT Short Term Goal 2 (Week 1): pt will propel w/c x 100' over level tile with supervision PT Short Term Goal 3 (Week 1): pt will participate in consultation with prosthetist regarding L prosthesis  Skilled Therapeutic Interventions/Progress Updates:    Patient received up in chair, reporting considerable pain but not giving number for rating, but willing to work with therapy and reporting that distraction significantly helps her pain. Worked on B LE strengthening at chair level with MinA for L LE, including hip flexion, LAQ, hamstring curl, hip ABD, and ankle dorsiflexion on R LE. Otherwise worked on [facility] mobility for improved upper body strength and functional activity tolerance with S for navigation and encouragement. She was handed off to next treating therapist in her [place] in hallway of the unit, all needs otherwise met.   Therapy Documentation Precautions:  Precautions Precautions: Fall Restrictions Weight Bearing Restrictions: Yes RLE Weight Bearing: Partial weight bearing LLE Weight Bearing: Non weight bearing General:   Pain: Pain Assessment Pain Scale: Faces Faces Pain Scale: Hurts even more Pain Type: Acute pain Pain Location: Leg Pain Orientation: Left Pain Descriptors / Indicators: Aching;Stabbing;Sore Pain Onset: On-going Patients Stated Pain Goal: 0 Pain Intervention(s): Elevated extremity;Distraction;Emotional support    Therapy/Group: Individual Therapy  Deniece Ree PT, DPT, CBIS  Supplemental Physical Therapist [facility]    Pager [phone] Acute Rehab Office [phone]   04/12/2018, 3:37 PM

## 2018-04-12 NOTE — Progress Notes (Signed)
Patient refuses CPAP. No machine in room

## 2018-04-13 ENCOUNTER — Inpatient Hospital Stay (HOSPITAL_COMMUNITY): Payer: Self-pay

## 2018-04-13 ENCOUNTER — Inpatient Hospital Stay (HOSPITAL_COMMUNITY): Payer: Self-pay | Admitting: Physical Therapy

## 2018-04-13 LAB — GLUCOSE, CAPILLARY
GLUCOSE-CAPILLARY: 75 mg/dL (ref 70–99)
GLUCOSE-CAPILLARY: 118 mg/dL — AB (ref 70–99)
Glucose-Capillary: 75 mg/dL (ref 70–99)
Glucose-Capillary: 95 mg/dL (ref 70–99)
Glucose-Capillary: 82 mg/dL (ref 70–99)

## 2018-04-13 LAB — CBC
HCT: 26 % — ABNORMAL LOW (ref 36.0–46.0)
HEMOGLOBIN: 7.4 g/dL — AB (ref 12.0–15.0)
MCH: 23.9 pg — ABNORMAL LOW (ref 26.0–34.0)
MCHC: 28.5 g/dL — ABNORMAL LOW (ref 30.0–36.0)
MCV: 84.1 fL (ref 80.0–100.0)
NRBC: 0.2 % (ref 0.0–0.2)
Platelets: 323 10*3/uL (ref 150–400)
RBC: 3.09 MIL/uL — AB (ref 3.87–5.11)
RDW: 15.4 % (ref 11.5–15.5)
WBC: 8.3 10*3/uL (ref 4.0–10.5)

## 2018-04-13 MED ORDER — OXYCODONE-ACETAMINOPHEN 5-325 MG PO TABS
1.0000 | ORAL_TABLET | ORAL | Status: DC | PRN
  Administered 2018-04-13 – 2018-04-18 (×15): 1 via ORAL
  Filled 2018-04-13 (×18): qty 1

## 2018-04-13 MED ORDER — POLYETHYLENE GLYCOL 3350 17 G PO PACK
17.0000 g | PACK | Freq: Every day | ORAL | Status: DC | PRN
  Administered 2018-04-18 – 2018-04-20 (×2): 17 g via ORAL
  Filled 2018-04-13: qty 1

## 2018-04-13 MED ORDER — INSULIN GLARGINE 100 UNIT/ML ~~LOC~~ SOLN
15.0000 [IU] | Freq: Two times a day (BID) | SUBCUTANEOUS | Status: DC
  Administered 2018-04-13 – 2018-04-14 (×3): 15 [IU] via SUBCUTANEOUS
  Filled 2018-04-13 (×5): qty 0.15

## 2018-04-13 NOTE — Progress Notes (Signed)
Patient refuses cpap 

## 2018-04-13 NOTE — IPOC Note (Addendum)
Overall Plan of Care [facility]) Patient Details Name: ARRINGTON BENCOMO MRN: [id number] DOB: 1975/12/07  Admitting Diagnosis: <principal problem not specified>myositis LLE  Hospital Problems: Active Problems:   Debility     Functional Problem List: Nursing Bladder, Bowel, Pain, Edema, Skin Integrity  PT Balance, Edema, Endurance, Motor, Sensory  OT Balance, Safety, Sensory, Skin Integrity, Vision, Edema, Endurance, Motor, Nutrition, Pain  SLP    TR   Activity tolerance, functional mobility, balance, safety, pain       Basic ADL's: OT Grooming, Bathing, Dressing, Toileting     Advanced  ADL's: OT       Transfers: PT Bed Mobility, Bed to Chair, Manufacturing systems engineer, Metallurgist: PT Emergency planning/management officer, Stairs     Additional Impairments: OT None  SLP        TR  community skills    Anticipated Outcomes Item Anticipated Outcome  Self Feeding independent  Swallowing      Basic self-care  mod I  Toileting  mod I   Bathroom Transfers (S)  Bowel/Bladder  LBM 04/10/18 with PRN, incontinent x 2  Transfers  supervision basic, min assist car  Locomotion  supervision w/c propulsion x 150'; gait and stairs TBD  Communication     Cognition     Pain  No c/o pain at admission  Safety/Judgment  to remain fall free while in rehab   Therapy Plan: PT Intensity: Minimum of 1-2 x/day ,45 to 90 minutes PT Frequency: 5 out of 7 days PT Duration Estimated Length of Stay: 14-17 OT Intensity: Minimum of 1-2 x/day, 45 to 90 minutes OT Frequency: 5 out of 7 days OT Duration/Estimated Length of Stay: 14-18 days    TR Duration/ELOS:  2.5 weeks TR Frequency:  Min 1 time per week >20 minutes     Team Interventions: Nursing Interventions Patient/Family Education, Disease Management/Prevention, Skin Care/Wound Management, Discharge Planning, Bladder Management, Pain Management, Bowel Management  PT interventions Ambulation/gait training, Community  reintegration, DME/adaptive equipment instruction, Neuromuscular re-education, Psychosocial support, Stair training, UE/LE Strength taining/ROM, Wheelchair propulsion/positioning, Training and development officer, Discharge planning, Functional electrical stimulation, Pain management, Therapeutic Activities, UE/LE Coordination activities, Functional mobility training, Patient/family education, Splinting/orthotics, Therapeutic Exercise  OT Interventions    SLP Interventions    TR Interventions   Recreation/leisure participation, Balance/Vestibular training, functional mobility, therapeutic activities, UE/LE strength/coordination, w/c mobility, community reintegration, pt/family education, adaptive equipment, instruction/use, discharge planning, psychosocial support  SW/CM Interventions Discharge Planning, Psychosocial Support, Patient/Family Education   Barriers to Discharge MD  Medical stability  Nursing IV antibiotics, Incontinence, Wound Care    PT Inaccessible home environment(pt has 1 STE, no rails) after previous CIR stay, pt did not have ramp installed  OT Inaccessible home environment, Decreased caregiver support    SLP      SW       Team Discharge Planning: Destination: PT-Home ,OT- Home , SLP-  Projected Follow-up: PT-Home health PT, OT-  Home health OT, SLP-  Projected Equipment Needs: PT-To be determined, OT- Tub/shower bench, SLP-  Equipment Details: PT-pt reported that amputee pad and footrest of manual w/c are missing; RW was broken in a fall and repaired in some way, and prosthesis socket is too large, OT-  Patient/family involved in discharge planning: PT- Patient,  OT-Patient, SLP-   MD ELOS: 14-17 days Medical Rehab Prognosis:  Excellent Assessment: The patient has been admitted for CIR therapies with the diagnosis of myositis LLE, debility. The team will be addressing functional mobility, strength, stamina, balance,  safety, adaptive techniques and equipment, self-care, bowel  and bladder mgt, patient and caregiver education, pain mgt/pacing, ego support, community reentry. Goals have been set at mod I for basic self-care and ADL's, and supervision for mobility/transfers. Pt is motivated.    Meredith Staggers, MD, FAAPMR      See Team Conference Notes for weekly updates to the plan of care

## 2018-04-13 NOTE — Progress Notes (Signed)
[facility] PHYSICAL MEDICINE & REHABILITATION PROGRESS NOTE   Subjective/Complaints: Up in bed. Sugars running low since yesterday evening. Low again this morning. Pt states she hasn't eaten differently  ROS: Patient denies fever, rash, sore throat, blurred vision, nausea, vomiting, diarrhea, cough, shortness of breath or chest pain,   headache, or mood change.   Objective:   No results found. Recent Labs    04/11/18 0331 04/13/18 0436  WBC 11.8* 8.3  HGB 7.6* 7.4*  HCT 26.9* 26.0*  PLT 417* 323   Recent Labs    04/11/18 0331  NA 136  K 4.6  CL 108  CO2 23  GLUCOSE 191*  BUN 29*  CREATININE 1.23*  CALCIUM 8.2*    Intake/Output Summary (Last 24 hours) at 04/13/2018 0909 Last data filed at 04/13/2018 0618 Gross per 24 hour  Intake 960 ml  Output 900 ml  Net 60 ml     Physical Exam: Vital Signs Blood pressure (!) 149/92, pulse 86, temperature 97.6 F (36.4 C), resp. rate 18, weight (!) 153.9 kg, SpO2 100 %. Constitutional: No distress . Vital signs reviewed. HEENT: EOMI, oral membranes moist Neck: supple Cardiovascular: RRR without murmur. No JVD    Respiratory: CTA Bilaterally without wheezes or rales. Normal effort    GI: BS +, non-tender, non-distended   Musculoskeletal: She exhibitsedema. Left leg tender, scar along incision. Tender to palpation.   Right foot with neuropathic ulcer/scab/slough---chronic Neurological: She isalertand oriented to person, place, and time. Nocranial nerve deficit. UE 5/5. LLE limited by pain. RLE 3-4/5 prox to distal with pain inhibition. Decreased LT distally.  Cognitively intact Skin: She isnot diaphoretic.  Psychiatric:   Assessment/Plan: 1. Functional deficits secondary to LLE myositis which require 3+ hours per day of interdisciplinary therapy in a comprehensive inpatient rehab setting.  Physiatrist is providing close team supervision and 24 hour management of active medical problems listed  below.  Physiatrist and rehab team continue to assess barriers to discharge/monitor patient progress toward functional and medical goals  Care Tool:  Bathing    Body parts bathed by patient: Left arm, Right arm, Chest, Abdomen, Front perineal area, Face, Right upper leg, Left upper leg, Right lower leg   Body parts bathed by helper: Buttocks Body parts n/a: Left lower leg   Bathing assist Assist Level: Moderate Assistance - Patient 50 - 74%     Upper Body Dressing/Undressing Upper body dressing   What is the patient wearing?: Hospital gown only    Upper body assist Assist Level: Contact Guard/Touching assist    Lower Body Dressing/Undressing Lower body dressing      What is the patient wearing?: Incontinence brief     Lower body assist Assist for lower body dressing: Maximal Assistance - Patient 25 - 49%     Toileting Toileting    Toileting assist Assist for toileting: Total Assistance - Patient < 25%     Transfers Chair/bed transfer  Transfers assist     Chair/bed transfer assist level: Dependent - mechanical lift     Locomotion Ambulation   Ambulation assist   Ambulation activity did not occur: Safety/medical concerns(unable due to weakness)          Walk 10 feet activity   Assist           Walk 50 feet activity   Assist           Walk 150 feet activity   Assist           Walk 10  feet on uneven surface  activity   Assist           Wheelchair     Assist Will patient use wheelchair at discharge?: Yes Type of Wheelchair: Manual    Wheelchair assist level: Supervision/Verbal cueing Max wheelchair distance: 40    Wheelchair 50 feet with 2 turns activity    Assist            Wheelchair 150 feet activity     Assist           Medical Problem List and Plan: 1.Functional and mobility deficitssecondary to left lower extremity myositis/strep bacteremia -continue with CIR 2. DVT  Prophylaxis/Anticoagulation: Pharmaceutical:Lovenox 3.Chronic back pain/Management: Continue oxycodone as needed  -pt is working through pain  4. Mood:LCSW to follow for evaluation and support. Egosupport to be provided by team 5. Neuropsych: This patientiscapable of making decisions on herown behalf. 6. Skin/Wound Care:Routine pressure relief measures.    -pt aware of shifting, boosting 7. Fluids/Electrolytes/Nutrition:Monitor I's and O's.  -encourage PO intake 8.Group B strep agalactiaebacteremia: Continue IV Ceftriaxone with end date 11/28  -cbc reviewed. Wbc's down to 8.3 today 9.T2DM with neuropathy: We will monitor blood sugars AC at bedtime.     - Lantus twice daily  .   -given hypoglycemia--decrease to 15u bid 10.Chronic constipation: BM 11/19.  Will continue to augment back bowel program 11.HTN: Monitor blood pressures twice daily. Off Bumex at this time.  12.Peripheral edema: Monitor weights daily. -compression/elevation of LE's 13.Bipolar disorder with depression: Continue Prozac 14.Acute on chronic renal failure: SCr 2.72 at admission--->down to 1.23 11.21 with adequate hydration 15.Anemia of chronic disease/iron deficiency:   -   stool guaiac negative.    - iron supplement.   -hgb stable at 7.4 11/22---recheck next week 16. Morbid obesiy-BMI-48.6: Educate patient on appropriate diet and importance of activity to help promote health and mobility.    LOS: 3 days A FACE TO FACE EVALUATION WAS PERFORMED  Meredith Staggers 04/13/2018, 9:09 AM

## 2018-04-13 NOTE — Progress Notes (Signed)
Incontinent of urine x 2, requiring total assistance. Will attempt to time toilet tonight. Refused scheduled Miralax and senna s. Linna Hoff, PA paged at [facility] R/T 67 blood sugar, orders to give 10 units of scheduled lantus. HS snack given. Patrici Ranks A

## 2018-04-13 NOTE — Progress Notes (Signed)
Social Work  Social Work Assessment and Plan  Patient Details  Name: [name] [name] MRN: [id number] Date of Birth: [birth year]  Today's Date: 04/13/2018  Problem List:  Patient Active Problem List   Diagnosis Date Noted  . Debility 04/10/2018  . Bacteremia due to group B Streptococcus 04/04/2018  . Severe protein-calorie malnutrition ([place])   . BKA stump complication ([place]) 01/60/1093  . Vaginal candidiasis 03/12/2018  . Herpes genitalis in women 01/31/2018  . Herpes simplex type 1 infection 01/16/2018  . Status post below knee amputation, left ([place]) 04/11/2017  . Bipolar affective disorder ([place])   . Adjustment disorder with depressed mood 03/26/2017  . Abnormal ECG 03/24/2017  . Sepsis ([place]) 03/24/2017  . Heart failure with preserved ejection fraction ([place]) 02/09/2017  . Routine adult health maintenance 12/08/2016  . Chronic pain of right knee 09/08/2016  . Chronic kidney disease (CKD), stage III (moderate) ([place]) 07/15/2016  . OSA (obstructive sleep apnea) 05/13/2016  . Morbid obesity due to excess calories ([place]) 11/27/2015  . History of Low serum cortisol level ([place]) 01/07/2015  . Hypothyroidism 04/25/2013  . Financial difficulty 04/25/2013  . Acute renal failure ([place]) 07/12/2012  . Uncontrolled type 2 diabetes mellitus ([place]) 07/12/2012  . Necrotizing fasciitis s/p OR debridements 07/06/2012  . Carpal tunnel syndrome, bilateral 01/25/2012  . Diabetic peripheral neuropathy ([place]) 07/04/2011  . Anxiety and depression 06/02/2011  . History of abnormal cervical Pap smear 12/13/2007  . GERD 12/06/2007  . Hyperlipidemia 08/29/2007  . Hypertension associated with diabetes ([place]) 08/29/2007   Past Medical History:  Past Medical History:  Diagnosis Date  . Abdominal muscle pain 09/08/2016  . Abnormal Pap smear of cervix 2009  . Abscess    history of multiple abscesses  . Acute bilateral low back pain 02/13/2017  . Acute blood loss anemia   . Anemia of chronic disease 2002   . Anxiety    Panic attacks  . Bilateral lower extremity edema 05/13/2016  . Bipolar disorder ([place])   . Cellulitis 05/21/2014   right eye  . Chronic bronchitis ([place])    "get it q yr" (05/13/2013)  . Chronic pain   . Depression   . Edema of lower extremity   . Endocarditis 2002   subacute bacterial endocarditis.   . Family history of anesthesia complication    "my mom has a hard time coming out from under"  . Fibromyalgia   . GERD (gastroesophageal reflux disease)    occ  . Heart murmur   . History of blood transfusion    "just low blood count" (05/13/2013)  . Hyperlipidemia   . Hypertension   . Hypothyroidism   . Hypothyroidism, adult 03/21/2014  . Leukocytosis   . Necrosis ([place])    and ulceration  . Obesity   . OSA on CPAP    does not wear CPAP  . Peripheral neuropathy   . Type II diabetes mellitus ([place])    Type  II   Past Surgical History:  Past Surgical History:  Procedure Laterality Date  . AMPUTATION Left 04/07/2017   Procedure: LEFT BELOW KNEE AMPUTATION;  Surgeon: Newt Minion, MD;  Location: [facility];  Service: Orthopedics;  Laterality: Left;  . EYE SURGERY     l[place]  . INCISION AND DRAINAGE ABSCESS     multiple I&Ds  . INCISION AND DRAINAGE ABSCESS Left 07/09/2012   Procedure: DRESSING CHANGE, THIGH WOUND;  Surgeon: Harl Bowie, MD;  Location: [facility];  Service: General;  Laterality: Left;  .  INCISION AND DRAINAGE OF WOUND Left 07/07/2012   Procedure: IRRIGATION AND DEBRIDEMENT WOUND;  Surgeon: Harl Bowie, MD;  Location: [facility];  Service: General;  Laterality: Left;  . INCISION AND DRAINAGE PERIRECTAL ABSCESS Left 07/14/2012   Procedure: DEBRIDEMENT OF SKIN & SOFT TISSUE; DRESSING CHANGE UNDER ANESTHESIA;  Surgeon: Gayland Curry, MD,FACS;  Location: [facility];  Service: General;  Laterality: Left;  . INCISION AND DRAINAGE PERIRECTAL ABSCESS Left 07/16/2012   Procedure: I&D Left Thigh;  Surgeon: Gwenyth Ober, MD;  Location: [facility];  Service: General;   Laterality: Left;  . INCISION AND DRAINAGE PERIRECTAL ABSCESS N/A 01/05/2015   Procedure: IRRIGATION AND DEBRIDEMENT PERIRECTAL ABSCESS;  Surgeon: Donnie Mesa, MD;  Location: [facility];  Service: General;  Laterality: N/A;  . IRRIGATION AND DEBRIDEMENT ABSCESS Left 07/06/2012   Procedure: IRRIGATION AND DEBRIDEMENT ABSCESS BUTTOCKS AND THIGH;  Surgeon: Shann Medal, MD;  Location: [facility];  Service: General;  Laterality: Left;  . IRRIGATION AND DEBRIDEMENT ABSCESS Left 08/10/2012   Procedure: IRRIGATION AND DEBRIDEMENT ABSCESS;  Surgeon: Madilyn Hook, DO;  Location: [facility];  Service: General;  Laterality: Left;  . STUMP REVISION Left 06/09/2017   Procedure: REVISION LEFT BELOW KNEE AMPUTATION;  Surgeon: Newt Minion, MD;  Location: [facility];  Service: Orthopedics;  Laterality: Left;  . TONSILLECTOMY  1994   Social History:  reports that she has been smoking cigarettes. She has a 16.00 pack-year smoking history. She has never used smokeless tobacco. She reports that she drinks alcohol. She reports that she does not use drugs.  Family / Support Systems Marital Status: Single Patient Roles: Other (Comment)(Has sister, grandmother, nephew) Other Supports: sister, Bettyjo Lundblad @ (C) [phone];  nephew, Aliviya Schoeller @ (C) [phone] - lives with pt Anticipated Caregiver: Nephew and Personal care attendents Ability/Limitations of Caregiver: Alanson Puls is currently looking for a job but is unemployed.  Has a sister.  No children.  Has Personal Care assistant daily for about 2 hours a day Caregiver Availability: Intermittent Family Dynamics: Pt notes good relationship with her nephew and sister and notes they will try and assist as they are able.  Nephew has had to assist her with with ADLs at times, however, she prefers this not be the case.  Social History Preferred language: English Religion: Non-Denominational Cultural Background: NA Read: Yes Write: Yes Employment Status: Disabled Date  Retired/Disabled/Unemployed: 2013 Freight forwarder Issues: None Guardian/Conservator: None -per MD, pt is capable of making decisions on her own behalf.   Abuse/Neglect Abuse/Neglect Assessment Can Be Completed: Yes Physical Abuse: Denies Verbal Abuse: Denies Sexual Abuse: Denies Exploitation of patient/patient's resources: Denies Self-Neglect: Yes, past (Comment)(see other encounter)  Emotional Status Pt's affect, behavior adn adjustment status: Pt with flat affect but completes assessment interview without difficulty and is appreciative of any assistance I can provide to her.  She talks about her mother who passed away ~1 yr ago and how she and her family are "still mourning her..."  Will monitor emotional status and refer for neuropsych as indicated. Recent Psychosocial Issues: Mother died July 13, 2017 Pyschiatric History: Pt with bipolar d/o - medications on board. Substance Abuse History: None  Patient / Family Perceptions, Expectations & Goals Pt/Family understanding of illness & functional limitations: Pt and family with good understanding of lower ext sepsis, treatment and current functional limitations/ need for return to CIR. Premorbid pt/family roles/activities: Pt was using walker and fairly independent with b/d PTA Anticipated changes in roles/activities/participation: Little change anticipated if pt able  to reach supervision goals. Pt/family expectations/goals: "I just want to be able to get around and get my prosthesis fixed."  [employer]: None Premorbid Home Care/DME Agencies: Other (Comment)([facility] after prior CIR stay) Transportation available at discharge: yes Resource referrals recommended: Neuropsychology, Support group (specify)  Discharge Planning Living Arrangements: Other relatives Support Systems: Other relatives Type of Residence: Private residence Insurance Resources: [employer], [employer] (specify county) Physicist, medical: [employer], [employer] Financial Screen Referred: No Living Expenses: Education officer, community Management: Patient Does the patient have any problems obtaining your medications?: No Home Management: pt and nephew manage home Patient/Family Preliminary Plans: Pt to return home with nephew and sister to assist as they are able. Social Work Anticipated Follow Up Needs: HH/OP Expected length of stay: 14-18 days  Clinical Impression Pleasant woman who returns to CIR from prior stay Oct 2018.  Returns due to sepsis/ infection in stump and now with debility.  Intermittent support at home of nephew and sister.  Most goals set for supervision.  Mood is stable but will monitor and refer for neuropsych as indicated.  Zandyr Barnhill 04/13/2018, 1:03 PM

## 2018-04-13 NOTE — Care Management (Signed)
[facility] Individual Statement of Services  Patient Name:  [name] [name]  Date:  04/13/2018  Welcome to the [facility].  Our goal is to provide you with an individualized program based on your diagnosis and situation, designed to meet your specific needs.  With this comprehensive rehabilitation program, you will be expected to participate in at least 3 hours of rehabilitation therapies Monday-Friday, with modified therapy programming on the weekends.  Your rehabilitation program will include the following services:  Physical Therapy (PT), Occupational Therapy (OT), 24 hour per day rehabilitation nursing, Therapeutic Recreaction (TR), Neuropsychology, Case Management (Social Worker), Rehabilitation Medicine, Nutrition Services and Pharmacy Services  Weekly team conferences will be held on Tuesdays to discuss your progress.  Your Social Worker will talk with you frequently to get your input and to update you on team discussions.  Team conferences with you and your family in attendance may also be held.  Expected length of stay: 14-17 days   Overall anticipated outcome: supervision  Depending on your progress and recovery, your program may change. Your Social Worker will coordinate services and will keep you informed of any changes. Your Social Worker's name and contact numbers are listed  below.  The following services may also be recommended but are not provided by the [facility] will be made to provide these services after discharge if needed.  Arrangements include referral to agencies that provide these services.  Your insurance has been verified to be:  Medicare and [place] Your primary doctor is:  [facility]  Pertinent information will be shared with your doctor and your insurance  company.  Social Worker:  [place], [place] or ([phone]   Information discussed with and copy given to patient by: Lennart Pall, 04/13/2018, 1:05 PM

## 2018-04-13 NOTE — Progress Notes (Signed)
Occupational Therapy Session Note  Patient Details  Name: [name] [name] MRN: [id number] Date of Birth: 1975-09-01  Today's Date: 04/13/2018 OT Individual Time: 1100-1200 OT Individual Time Calculation (min): 60 min    Short Term Goals: Week 1:  OT Short Term Goal 1 (Week 1): Pt will don pants with mod A +1 OT Short Term Goal 2 (Week 1): Pt will don shirt sitting EOB with (S) for improved sitting balance OT Short Term Goal 3 (Week 1): Pt will complete squat pivot to w/c with max +1  Skilled Therapeutic Interventions/Progress Updates:    Session focused on shower level b/d. Pt set up to lateral scoot w/c > bariatric drop arm BSC in walk in shower. 2 helpers required to stabilize all equipment for safety but only min lifting A for pt to scoot. Moderate vc for body mechanics to maximize efforts with scooting. Once sat up, pt able to complete all bathing except buttocks. Pt transferred back to w/c in similar fashion. Increased time was required for safety and set up of equipment. Pt required mod A to apply lotion following shower distally. Pt left sitting up in w/c with all needs met and lunch present.   Therapy Documentation Precautions:  Precautions Precautions: Fall Restrictions Weight Bearing Restrictions: Yes RLE Weight Bearing: Partial weight bearing LLE Weight Bearing: Non weight bearing  Pain: Pain Assessment Pain Scale: 0-10 Pain Score: 6  Pain Type: Acute pain Pain Location: Leg Pain Orientation: Left Pain Intervention(s): Medication (See eMAR)   Therapy/Group: Individual Therapy  Curtis Sites 04/13/2018, 12:26 PM

## 2018-04-13 NOTE — Progress Notes (Addendum)
Physical Therapy Session Note  Patient Details  Name: [name] [name] MRN: [id number] Date of Birth: Jun 02, 1975  Today's Date: 04/13/2018 PT Individual Time: 0925-1025 and 1400-1508 PT Individual Time Calculation (min): 60 min and 68 min   Short Term Goals: Week 1:  PT Short Term Goal 1 (Week 1): pt will transfer with assistance of 1 person bed>< w/c PT Short Term Goal 2 (Week 1): pt will propel w/c x 100' over level tile with supervision PT Short Term Goal 3 (Week 1): pt will participate in consultation with prosthetist regarding L prosthesis  Skilled Therapeutic Interventions/Progress Updates:    pt performs bed mobility with supervision.  Sliding board transfers throughout session with supervision/min A with assist for board placement and to steady w/c during transfer.  Sit <> stand training at parallel bars with +2 min/mod A. Pt able to tolerate standing 3 x 2 minutes with focus on posture and LE endurance.  Simulated car transfer with sliding board and min A.  Pt with good upper body strength.  UBE 4 min fwd/4 min bkwd for UE endurance training with 1 rest break due to fatigue. Pt able to propel w/c throughout unit with supervision. Pt left in w/c with needs at hand.  Session 2:  Pt performs w/c mobility with supervision throughout unit.  Sliding board transfers with set up and supervision.  Sit <> stand repetitions with RW x 5 with +2 min A, cues for LE placement, cues to posture and upright standing.  Pt able to stand up to 4 minutes before requiring seated rest break. Standing balance with horseshoe task with pt able to maintain standing balance with 1 UE support with min A.  Seated abdominal strength with trunk PNFs and rotations with 6# med ball.  Seated LE therex for strengthening and endurance.  Pt left in room with needs at hand.  Therapy Documentation Precautions:  Precautions Precautions: Fall Restrictions Weight Bearing Restrictions: Yes RLE Weight Bearing: Partial  weight bearing LLE Weight Bearing: Non weight bearing Pain: Pt with no c/o pain during session, meds given prior to session   Therapy/Group: Individual Therapy  Jaelen Gellerman 04/13/2018, 10:26 AM

## 2018-04-14 ENCOUNTER — Inpatient Hospital Stay (HOSPITAL_COMMUNITY): Payer: Self-pay | Admitting: Physical Therapy

## 2018-04-14 ENCOUNTER — Inpatient Hospital Stay (HOSPITAL_COMMUNITY): Payer: Self-pay | Admitting: Occupational Therapy

## 2018-04-14 LAB — GLUCOSE, CAPILLARY
Glucose-Capillary: 82 mg/dL (ref 70–99)
Glucose-Capillary: 75 mg/dL (ref 70–99)
Glucose-Capillary: 73 mg/dL (ref 70–99)
Glucose-Capillary: 62 mg/dL — ABNORMAL LOW (ref 70–99)
Glucose-Capillary: 86 mg/dL (ref 70–99)

## 2018-04-14 MED ORDER — INSULIN GLARGINE 100 UNIT/ML ~~LOC~~ SOLN
10.0000 [IU] | Freq: Two times a day (BID) | SUBCUTANEOUS | Status: DC
  Administered 2018-04-15: 5 [IU] via SUBCUTANEOUS
  Administered 2018-04-15 – 2018-04-18 (×6): 10 [IU] via SUBCUTANEOUS
  Filled 2018-04-14 (×7): qty 0.1

## 2018-04-14 MED ORDER — AMLODIPINE BESYLATE 5 MG PO TABS
5.0000 mg | ORAL_TABLET | Freq: Every day | ORAL | Status: DC
  Administered 2018-04-14 – 2018-04-29 (×16): 5 mg via ORAL
  Filled 2018-04-14 (×16): qty 1

## 2018-04-14 MED ORDER — FLUOXETINE HCL 20 MG/5ML PO SOLN
60.0000 mg | Freq: Every day | ORAL | Status: DC
  Administered 2018-04-15 – 2018-05-01 (×17): 60 mg via ORAL
  Filled 2018-04-14 (×21): qty 15

## 2018-04-14 NOTE — Progress Notes (Signed)
Physical Therapy Session Note  Patient Details  Name: [name] [name] MRN: [id number] Date of Birth: Oct 17, 1975  Today's Date: 04/14/2018 PT Individual Time: 1300-1410 PT Individual Time Calculation (min): 70 min   Short Term Goals: Week 1:  PT Short Term Goal 1 (Week 1): pt will transfer with assistance of 1 person bed>< w/c PT Short Term Goal 2 (Week 1): pt will propel w/c x 100' over level tile with supervision PT Short Term Goal 3 (Week 1): pt will participate in consultation with prosthetist regarding L prosthesis  Skilled Therapeutic Interventions/Progress Updates:   Pt in w/c and agreeable to therapy, pain as detailed below.  Pt self-propelled w/c to/from therapy gym w/ supervision using BUEs and increased time. Attempted to work on sit<>stands to RW, was unsuccessful despite max +2 assist. Attempted at parallel bars, unsuccessful during this as well despite multiple attempts. Able to clear bottom from chair, but unable to boost into standing. Worked on L knee ROM during seated rest breaks, performed active knee flexion within tolerable pain limits in multiple reps of 5. Pt discouraged by not being able to stand during today's session. Educated pt on endurance and strength deficits and that she could be more fatigued from yesterday's sessions than she realizes. Provided therapeutic listening and encouragement regarding her CLOF and improvements she has already made. Returned to room via w/c, ended session in w/c and all needs met.   Therapy Documentation Precautions:  Precautions Precautions: Fall Restrictions Weight Bearing Restrictions: Yes RLE Weight Bearing: Partial weight bearing LLE Weight Bearing: Non weight bearing Pain:    Therapy/Group: Individual Therapy  Zannie Runkle Clent Demark 04/14/2018, 2:36 PM

## 2018-04-14 NOTE — Progress Notes (Signed)
This Am C/O pain under left breast, reports "sharp" and tender when palpated. PRN ultram and maalox given.  Dr. Asa Lente paged. [name] [name] A

## 2018-04-14 NOTE — Progress Notes (Addendum)
[name] [name] is a 42 y.o. female Dec 09, 1975 [id number]  Subjective: Complains of chronic pain and swelling of the left stump. No new problems. Slept well. Feeling OK.  Objective: Vital signs in last 24 hours: Temp:  [97.6 F (36.4 C)-98.4 F (36.9 C)] 97.6 F (36.4 C) (11/23 0411) Pulse Rate:  [91-96] 91 (11/23 0411) Resp:  [18] 18 (11/23 0411) BP: (137-168)/(81-99) 150/90 (11/23 0411) SpO2:  [99 %-100 %] 100 % (11/23 0411) Weight:  [153.3 kg] 153.3 kg (11/23 0500) Weight change: -0.6 kg Last BM Date: 04/13/18  Intake/Output from previous day: 11/22 0701 - 11/23 0700 In: 1200 [P.O.:1200] Out: 500 [Urine:500] Last cbgs: CBG (last 3)  Recent Labs    04/13/18 2130 04/14/18 0636 04/14/18 1145  GLUCAP 118* 82 75     Physical Exam General: No apparent distress.  Morbidly obese HEENT: not dry Lungs: Normal effort. Lungs clear to auscultation, no crackles or wheezes. Cardiovascular: Regular rate and rhythm, + edema Abdomen: S/NT/ND; BS(+) Musculoskeletal:  unchanged Neurological: No new neurological deficits Wounds: Clean.  Left stump with trace edema and a well-healed scar Skin: clear   Mental state: Alert, oriented, cooperative    Lab Results: BMET    Component Value Date/Time   NA 136 04/11/2018 0331   NA 136 11/30/2017 1606   K 4.6 04/11/2018 0331   CL 108 04/11/2018 0331   CO2 23 04/11/2018 0331   GLUCOSE 191 (H) 04/11/2018 0331   BUN 29 (H) 04/11/2018 0331   BUN 17 11/30/2017 1606   CREATININE 1.23 (H) 04/11/2018 0331   CREATININE 0.86 02/03/2014 1017   CALCIUM 8.2 (L) 04/11/2018 0331   GFRNONAA 54 (L) 04/11/2018 0331   GFRNONAA 83 07/23/2013 1604   GFRAA >60 04/11/2018 0331   GFRAA >89 07/23/2013 1604   CBC    Component Value Date/Time   WBC 8.3 04/13/2018 0436   RBC 3.09 (L) 04/13/2018 0436   HGB 7.4 (L) 04/13/2018 0436   HGB 12.1 08/13/2015 1629   HCT 26.0 (L) 04/13/2018 0436   HCT 36.3 08/13/2015 1629   PLT 323 04/13/2018 0436   PLT 232 08/13/2015 1629   MCV 84.1 04/13/2018 0436   MCV 79 08/13/2015 1629   MCH 23.9 (L) 04/13/2018 0436   MCHC 28.5 (L) 04/13/2018 0436   RDW 15.4 04/13/2018 0436   RDW 15.3 08/13/2015 1629   LYMPHSABS 2.1 04/11/2018 0331   MONOABS 0.7 04/11/2018 0331   EOSABS 0.4 04/11/2018 0331   BASOSABS 0.0 04/11/2018 0331    Studies/Results: No results found.  Medications: I have reviewed the patient's current medications.  Assessment/Plan:  1.  Left lower extremity myositis/strep bacteremia.  Continue with CIR therapies 2.  DVT prophylaxis with Lovenox 3.  Chronic back pain continue oxycodone as needed.  The patient is complaining of pain and swelling in the left stump that was operated approximately a year ago 4.  Emotional support 5.  Skin care 6.  Bacteremia with strep group B.  Continue with IV ceftriaxone to finish on 04/19/2018 7.  Type 2 diabetes with neuropathy.  Lantus twice daily 8.  Chronic constipation. 9.  Hypertension.  Monitoring blood pressure 10.  Acute on chronic renal failure.  Monitoring creatinine, maintain good hydration 11.  Peripheral edema.  Compression/elevation 12.  Bipolar depression.  Continue with Prozac 13.  Anemia.  Monitor CBC 14.  Morbid obesity.  Dietary consult     Length of stay, days: 4  Walker Kehr , MD 04/14/2018, 1:50 PM

## 2018-04-14 NOTE — Progress Notes (Signed)
Occupational Therapy Session Note  Patient Details  Name: [name] [name] MRN: [id number] Date of Birth: 1975/10/24  Today's Date: 04/14/2018 OT Individual Time: 1500-1600 OT Individual Time Calculation (min): 60 min    Short Term Goals: Week 1:  OT Short Term Goal 1 (Week 1): Pt will don pants with mod A +1 OT Short Term Goal 2 (Week 1): Pt will don shirt sitting EOB with (S) for improved sitting balance OT Short Term Goal 3 (Week 1): Pt will complete squat pivot to w/c with max +1  Skilled Therapeutic Interventions/Progress Updates:    Upon entering the room, pt seated in wheelchair with no c/o pain and agreeable to OT intervention. Pt propelled wheelchair 100' with supervision and increased time towards gym for B UE strengthening and endurance. Pt transferred onto mat with set up A for slide board and steadying of equipment only towards L side. Pt able to roll into prone with increased effort and min cuing for technique. Stretch while in prone and pt able to perform L LE exercises for strengthening in reps of 5 secondary to fatigue. Pt able to roll back onto bottom and coming into long sitting with supervision. OT provided inspection mirror and pt looking at residual stump and educated on concerns and purpose of inspecting skin each day. Pt verbalized understanding.Transfer back into wheelchair in same manner as above. OT assisted pt back to room and left with call bell and all needed items within reach upon exiting the room.  Therapy Documentation Precautions:  Precautions Precautions: Fall Restrictions Weight Bearing Restrictions: Yes RLE Weight Bearing: Partial weight bearing LLE Weight Bearing: Non weight bearing General:   Vital Signs: Therapy Vitals Temp: 98.7 F (37.1 C) Pulse Rate: 98 Resp: 20 BP: (!) 167/97 Patient Position (if appropriate): Sitting Oxygen Therapy SpO2: 100 % O2 Device: Room Air   Therapy/Group: Individual Therapy  Gypsy Decant 04/14/2018, 4:10 PM

## 2018-04-14 NOTE — Progress Notes (Addendum)
Occupational Therapy Session Note  Patient Details  Name: [name] [name] MRN: [id number] Date of Birth: June 05, 1975  Today's Date: 04/14/2018 OT Individual Time: 5183-4373 OT Individual Time Calculation (min): 58 min   Short Term Goals: Week 1:  OT Short Term Goal 1 (Week 1): Pt will don pants with mod A +1 OT Short Term Goal 2 (Week 1): Pt will don shirt sitting EOB with (S) for improved sitting balance OT Short Term Goal 3 (Week 1): Pt will complete squat pivot to w/c with max +1  Skilled Therapeutic Interventions/Progress Updates:    Pt greeted in bed, RN present to administer pain medication. Tx focus on placed on adaptive bathing/dressing skills, functional transfers, and activity tolerance during self care tasks. Pt transitioned EOB with supervision to participate in UB/LB bathing. Min A required for thoroughness under skin folds and antifungal powder application. Pt with a little skin breakdown under Lt breast. RN made aware. She opted to skip posterior perihygiene due to completing this with RN earlier post BM. Assist required for doffing/donning Rt gripper sock and setup for hospital gown. Discussed wearing large dresses at home to increase ease of dressing. Grooming tasks completed with setup. Afterwards pt completed slideboard<w/c with steady assist towards Lt side! She verbalized feeling very proud of this and we celebrated. At end of session pt was set up in w/c to complete oral care. Call bell threaded through arm rest. Pt reported she would call for staff assist when she wanted to be positioned near bed. Left her with necessary items at sink.   Pt expressed feelings of frustration and loss regarding her Lt limb. OT provided therapeutic listening and support throughout session to address psychosocial health.   Therapy Documentation Precautions:  Precautions Precautions: Fall Restrictions Weight Bearing Restrictions: Yes RLE Weight Bearing: Partial weight bearing LLE  Weight Bearing: Non weight bearing Pain: In residual limb. Premedicated.  Pain Assessment Pain Score: 8  ADL:       Therapy/Group: Individual Therapy  Floria Brandau A Brode Sculley 04/14/2018, 12:18 PM

## 2018-04-15 ENCOUNTER — Inpatient Hospital Stay (HOSPITAL_COMMUNITY): Payer: Self-pay | Admitting: Occupational Therapy

## 2018-04-15 ENCOUNTER — Inpatient Hospital Stay (HOSPITAL_COMMUNITY): Payer: Self-pay | Admitting: Physical Therapy

## 2018-04-15 LAB — GLUCOSE, CAPILLARY
GLUCOSE-CAPILLARY: 104 mg/dL — AB (ref 70–99)
Glucose-Capillary: 120 mg/dL — ABNORMAL HIGH (ref 70–99)
Glucose-Capillary: 144 mg/dL — ABNORMAL HIGH (ref 70–99)

## 2018-04-15 LAB — OCCULT BLOOD X 1 CARD TO LAB, STOOL: Fecal Occult Bld: NEGATIVE

## 2018-04-15 MED ORDER — BUMETANIDE 1 MG PO TABS
1.0000 mg | ORAL_TABLET | Freq: Every day | ORAL | Status: DC
  Administered 2018-04-15 – 2018-04-23 (×9): 1 mg via ORAL
  Filled 2018-04-15 (×9): qty 1

## 2018-04-15 MED ORDER — WHITE PETROLATUM EX OINT
TOPICAL_OINTMENT | CUTANEOUS | Status: AC
  Administered 2018-04-15: 16:00:00
  Filled 2018-04-15: qty 28.35

## 2018-04-15 MED ORDER — WHITE PETROLATUM EX OINT
TOPICAL_OINTMENT | CUTANEOUS | Status: AC
  Administered 2018-04-15: 15:00:00
  Filled 2018-04-15: qty 28.35

## 2018-04-15 NOTE — Progress Notes (Signed)
[name] [name] is a 42 y.o. female 28-Jun-1975 [id number]  Subjective: No new complaints. No new problems. Slept well. Feeling OK.  Objective: Vital signs in last 24 hours: Temp:  [97.7 F (36.5 C)-98.7 F (37.1 C)] 97.7 F (36.5 C) (11/24 0331) Pulse Rate:  [93-98] 93 (11/24 0331) Resp:  [18-20] 18 (11/24 0331) BP: (158-180)/(90-112) 158/90 (11/24 0331) SpO2:  [100 %] 100 % (11/24 0331) Weight change:  Last BM Date: 04/14/18  Intake/Output from previous day: 11/23 0701 - 11/24 0700 In: 1950 [P.O.:1840; I.V.:10; IV Piggyback:100] Out: 1425 [Urine:1425] Last cbgs: CBG (last 3)  Recent Labs    04/14/18 2206 04/15/18 0631 04/15/18 1136  GLUCAP 86 104* 120*     Physical Exam General: No apparent distress  Obese HEENT: not dry Lungs: Normal effort. Lungs clear to auscultation, no crackles or wheezes. Cardiovascular: Regular rate and rhythm, trace edema amputee Abdomen: S/NT/ND; BS(+) Musculoskeletal:  unchanged Neurological: No new neurological deficits Wounds: clean   Skin: clear   Mental state: Alert, oriented, cooperative    Lab Results: BMET    Component Value Date/Time   NA 136 04/11/2018 0331   NA 136 11/30/2017 1606   K 4.6 04/11/2018 0331   CL 108 04/11/2018 0331   CO2 23 04/11/2018 0331   GLUCOSE 191 (H) 04/11/2018 0331   BUN 29 (H) 04/11/2018 0331   BUN 17 11/30/2017 1606   CREATININE 1.23 (H) 04/11/2018 0331   CREATININE 0.86 02/03/2014 1017   CALCIUM 8.2 (L) 04/11/2018 0331   GFRNONAA 54 (L) 04/11/2018 0331   GFRNONAA 83 07/23/2013 1604   GFRAA >60 04/11/2018 0331   GFRAA >89 07/23/2013 1604   CBC    Component Value Date/Time   WBC 8.3 04/13/2018 0436   RBC 3.09 (L) 04/13/2018 0436   HGB 7.4 (L) 04/13/2018 0436   HGB 12.1 08/13/2015 1629   HCT 26.0 (L) 04/13/2018 0436   HCT 36.3 08/13/2015 1629   PLT 323 04/13/2018 0436   PLT 232 08/13/2015 1629   MCV 84.1 04/13/2018 0436   MCV 79 08/13/2015 1629   MCH 23.9 (L) 04/13/2018  0436   MCHC 28.5 (L) 04/13/2018 0436   RDW 15.4 04/13/2018 0436   RDW 15.3 08/13/2015 1629   LYMPHSABS 2.1 04/11/2018 0331   MONOABS 0.7 04/11/2018 0331   EOSABS 0.4 04/11/2018 0331   BASOSABS 0.0 04/11/2018 0331    Studies/Results: No results found.  Medications: I have reviewed the patient's current medications.  Assessment/Plan:   1.  Left lower extremity myositis/strep bacteremia.  Continue with CIR therapies 2.  DVT prophylaxis with Lovenox 3.  Chronic back pain.  Oxycodone as needed.  The patient is complaining of pain and swelling in the left stump that was operated on approximately a year ago will restart Bumex that she took as an outpatient 4.  Emotional support 5.  Skin care 6.  Bacteremia with strep group B.  Continue with IV ceftriaxone to finish on 04/19/2018 7.  Type 2 diabetes with neuropathy.  She is on Lantus twice daily.  We have been reducing Lantus due to hypoglycemia 8.  Chronic constipation.  Bowel program 9.  Hypertension.  Monitoring blood pressure.  Adding Bumex should help.  Monitor labs 10.  Acute on chronic renal failure - continue to monitor creatinine on Bumex.  Maintain good hydration. 11.  Peripheral edema.  Bumex was added-see above 12.  Bipolar depression.  Continue with Prozac 13.  Anemia.  Monitor CBC 14.  Morbid obesity.  Dietary  consultation follow-up      Length of stay, days: 5  Walker Kehr , MD 04/15/2018, 1:49 PM

## 2018-04-15 NOTE — Progress Notes (Signed)
Patient refused CPAP. Does not use at home.  Encouraged to call for Respiratory is CPAP desired during hospital stay.

## 2018-04-15 NOTE — Progress Notes (Signed)
At 1950-BP 180/112, after transfer from [facility] to bed. Rechecked BP at 2115=173/104. CBG=62. Paged Dr. Alain Marion, orders received to hold lantus and start on norvasc. Norvasc given at 2206. PRN percocet given for LLE pain. 2-3 + edema to left leg. Foam dressing applied to sacrum. Pressure areas noted. Instructed patient to stay off buttocks. Patient voiced understanding.[name] [name] A

## 2018-04-15 NOTE — Progress Notes (Signed)
Hypoglycemic Event  CBG: 62   Treatment: 15 GM carbohydrate snack  Symptoms: None  Follow-up CBG: Time:2206 CBG Result:86  Possible Reasons for Event: Unknown  Comments/MD notified:Treated per hypoglycemic protocol.     [name] [name] A

## 2018-04-16 ENCOUNTER — Inpatient Hospital Stay (HOSPITAL_COMMUNITY): Payer: Self-pay | Admitting: Occupational Therapy

## 2018-04-16 ENCOUNTER — Inpatient Hospital Stay (HOSPITAL_COMMUNITY): Payer: Self-pay | Admitting: Physical Therapy

## 2018-04-16 ENCOUNTER — Inpatient Hospital Stay (HOSPITAL_COMMUNITY): Payer: Self-pay

## 2018-04-16 DIAGNOSIS — R7881 Bacteremia: Secondary | ICD-10-CM

## 2018-04-16 DIAGNOSIS — D638 Anemia in other chronic diseases classified elsewhere: Secondary | ICD-10-CM

## 2018-04-16 DIAGNOSIS — R609 Edema, unspecified: Secondary | ICD-10-CM

## 2018-04-16 DIAGNOSIS — E875 Hyperkalemia: Secondary | ICD-10-CM

## 2018-04-16 DIAGNOSIS — I10 Essential (primary) hypertension: Secondary | ICD-10-CM

## 2018-04-16 LAB — GLUCOSE, CAPILLARY
Glucose-Capillary: 151 mg/dL — ABNORMAL HIGH (ref 70–99)
Glucose-Capillary: 93 mg/dL (ref 70–99)
Glucose-Capillary: 102 mg/dL — ABNORMAL HIGH (ref 70–99)
Glucose-Capillary: 131 mg/dL — ABNORMAL HIGH (ref 70–99)
Glucose-Capillary: 159 mg/dL — ABNORMAL HIGH (ref 70–99)

## 2018-04-16 LAB — BASIC METABOLIC PANEL
Anion gap: 3 — ABNORMAL LOW (ref 5–15)
BUN: 17 mg/dL (ref 6–20)
CALCIUM: 8.4 mg/dL — AB (ref 8.9–10.3)
CO2: 27 mmol/L (ref 22–32)
CREATININE: 0.92 mg/dL (ref 0.44–1.00)
Chloride: 107 mmol/L (ref 98–111)
GFR calc Af Amer: >60 mL/min (ref >60)
GLUCOSE: 108 mg/dL — AB (ref 70–99)
Potassium: 5.2 mmol/L — ABNORMAL HIGH (ref 3.5–5.1)
Sodium: 137 mmol/L (ref 135–145)

## 2018-04-16 LAB — CBC
HEMATOCRIT: 24.7 % — AB (ref 36.0–46.0)
Hemoglobin: 7.1 g/dL — ABNORMAL LOW (ref 12.0–15.0)
MCH: 24.1 pg — AB (ref 26.0–34.0)
MCHC: 28.7 g/dL — AB (ref 30.0–36.0)
MCV: 83.7 fL (ref 80.0–100.0)
PLATELETS: 281 10*3/uL (ref 150–400)
RBC: 2.95 MIL/uL — ABNORMAL LOW (ref 3.87–5.11)
RDW: 14.7 % (ref 11.5–15.5)
WBC: 5.4 10*3/uL (ref 4.0–10.5)
nRBC: 0 % (ref 0.0–0.2)

## 2018-04-16 LAB — OCCULT BLOOD X 1 CARD TO LAB, STOOL: Fecal Occult Bld: POSITIVE — AB

## 2018-04-16 MED ORDER — METHOCARBAMOL 500 MG PO TABS
500.0000 mg | ORAL_TABLET | Freq: Four times a day (QID) | ORAL | Status: DC | PRN
  Administered 2018-04-18: 500 mg via ORAL
  Filled 2018-04-16: qty 1

## 2018-04-16 MED ORDER — TROLAMINE SALICYLATE 10 % EX CREA
TOPICAL_CREAM | Freq: Four times a day (QID) | CUTANEOUS | Status: DC | PRN
  Filled 2018-04-16: qty 85

## 2018-04-16 MED ORDER — MUSCLE RUB 10-15 % EX CREA
TOPICAL_CREAM | Freq: Four times a day (QID) | CUTANEOUS | Status: DC | PRN
  Filled 2018-04-16: qty 85

## 2018-04-16 MED ORDER — PREGABALIN 25 MG PO CAPS
25.0000 mg | ORAL_CAPSULE | Freq: Every day | ORAL | Status: DC
  Administered 2018-04-16 – 2018-04-25 (×10): 25 mg via ORAL
  Filled 2018-04-16 (×10): qty 1

## 2018-04-16 NOTE — Progress Notes (Signed)
Physical Therapy Session Note  Patient Details  Name: [name] [name] MRN: [id number] Date of Birth: 08/04/1975  Today's Date: 04/16/2018 PT Individual Time: 1594-7076 PT Individual Time Calculation (min): 40 min   Short Term Goals: Week 1:  PT Short Term Goal 1 (Week 1): pt will transfer with assistance of 1 person bed>< w/c PT Short Term Goal 2 (Week 1): pt will propel w/c x 100' over level tile with supervision PT Short Term Goal 3 (Week 1): pt will participate in consultation with prosthetist regarding L prosthesis  Skilled Therapeutic Interventions/Progress Updates:    pt requests to work on standing this session. Pt propels w/c throughout unit with supervision.  Standing at standing frame x 5 minutes with focus on upright posture.  Sit <> stand repetitions with 1 minute standing x 5 repetitions.  Pt left in room with RN present, needs at hand.  Therapy Documentation Precautions:  Precautions Precautions: Fall Restrictions Weight Bearing Restrictions: No RLE Weight Bearing: Weight bearing as tolerated LLE Weight Bearing: Non weight bearing(s/p amputation) Pain: Pt c/o 5/10 pain in residual limb.  Rest and repositioned as needed.  meds given prior to session   Therapy/Group: Individual Therapy  Tavi Hoogendoorn 04/16/2018, 4:26 PM

## 2018-04-16 NOTE — Progress Notes (Signed)
[facility] PHYSICAL MEDICINE & REHABILITATION PROGRESS NOTE   Subjective/Complaints: Patient seen laying in bed this morning after returning from the restroom about to work with therapies.  She states she slept well overnight.  She states she had a good weekend.  She states she is in pain from the transfer.  ROS: No edema or tenderness in extremities  Objective:   No results found. Recent Labs    04/16/18 0416  WBC 5.4  HGB 7.1*  HCT 24.7*  PLT 281   Recent Labs    04/16/18 0416  NA 137  K 5.2*  CL 107  CO2 27  GLUCOSE 108*  BUN 17  CREATININE 0.92  CALCIUM 8.4*    Intake/Output Summary (Last 24 hours) at 04/16/2018 1042 Last data filed at 04/16/2018 0830 Gross per 24 hour  Intake 1550 ml  Output 550 ml  Net 1000 ml     Physical Exam: Vital Signs Blood pressure (!) 149/92, pulse (!) 111, temperature 97.7 F (36.5 C), resp. rate 20, weight (!) 153.3 kg, SpO2 95 %. Constitutional: No distress . Vital signs reviewed. HENT: Normocephalic.  Atraumatic. Eyes: EOMI. No discharge. Cardiovascular: RRR. No JVD. Respiratory: CTA Bilaterally. Normal effort. GI: BS +. Non-distended. Musc: No edema or tenderness in extremities. Musculoskeletal: She exhibitsedema and tenderness. Left leg tender, scar along incision. Tender to palpation.    Right foot with chronic wound Neurological: She isalertand oriented  Motor: B/l UE 5/5.  LLE: HF 4-/5, limited by pain.  RLE: 4/5 prox to distal with pain inhibition.  Skin: She isnot diaphoretic.  Left stump with dark skin proximal and distal to the incision site Psychiatric:Normal mood.  Normal behavior.   Assessment/Plan: 1. Functional deficits secondary to LLE myositis which require 3+ hours per day of interdisciplinary therapy in a comprehensive inpatient rehab setting.  Physiatrist is providing close team supervision and 24 hour management of active medical problems listed below.  Physiatrist and rehab team continue  to assess barriers to discharge/monitor patient progress toward functional and medical goals  Care Tool:  Bathing    Body parts bathed by patient: Left arm, Right arm, Chest, Abdomen, Front perineal area, Face, Right upper leg, Left upper leg   Body parts bathed by helper: Right lower leg Body parts n/a: Left lower leg, Buttocks   Bathing assist Assist Level: Minimal Assistance - Patient > 75%     Upper Body Dressing/Undressing Upper body dressing   What is the patient wearing?: Hospital gown only    Upper body assist Assist Level: Supervision/Verbal cueing    Lower Body Dressing/Undressing Lower body dressing      What is the patient wearing?: Incontinence brief     Lower body assist Assist for lower body dressing: Maximal Assistance - Patient 25 - 49%     Toileting Toileting    Toileting assist Assist for toileting: Total Assistance - Patient < 25%     Transfers Chair/bed transfer  Transfers assist     Chair/bed transfer assist level: Minimal Assistance - Patient > 75%     Locomotion Ambulation   Ambulation assist   Ambulation activity did not occur: Safety/medical concerns          Walk 10 feet activity   Assist           Walk 50 feet activity   Assist           Walk 150 feet activity   Assist  Walk 10 feet on uneven surface  activity   Assist           Wheelchair     Assist Will patient use wheelchair at discharge?: Yes Type of Wheelchair: Manual    Wheelchair assist level: Supervision/Verbal cueing Max wheelchair distance: 150 ft    Wheelchair 50 feet with 2 turns activity    Assist    Wheelchair 50 feet with 2 turns activity did not occur: Safety/medical concerns(Per report )   Assist Level: Supervision/Verbal cueing   Wheelchair 150 feet activity     Assist Wheelchair 150 feet activity did not occur: Safety/medical concerns(Per report )   Assist Level: Supervision/Verbal  cueing     Medical Problem List and Plan: 1.Functional and mobility deficitssecondary to left lower extremity myositis/strep bacteremia -continue with CIR  Notes reviewed, labs reviewed 2. DVT Prophylaxis/Anticoagulation: Pharmaceutical:Lovenox 3.Chronic back pain/Management: Continue oxycodone as needed  -pt is working through pain  4. Mood:LCSW to follow for evaluation and support. Egosupport to be provided by team 5. Neuropsych: This patientiscapable of making decisions on herown behalf. 6. Skin/Wound Care:Routine pressure relief measures.    -pt aware of shifting, boosting 7. Fluids/Electrolytes/Nutrition:Monitor I's and O's.  -encourage PO intake 8.Group B strep agalactiaebacteremia: Continue IV Ceftriaxone with end date 11/28  WBCs 5.4 on 11/25 9.T2DM with neuropathy: We will monitor blood sugars AC at bedtime.     Lantus twice daily decreased to 15 units twice daily  Relatively controlled on 11/25 10.Chronic constipation: BM 11/19.  Will continue to augment back bowel program 11.HTN: Monitor blood pressures twice daily.   Bumex restarted  Remains elevated, will continue to monitor 12.Peripheral edema: Monitor weights daily. -compression/elevation of LE's 13.Bipolar disorder with depression: Continue Prozac 14.Acute on?  Chronic renal failure:   Creatinine 0.9 on 11/25  Continue to monitor 15.Anemia of chronic disease/iron deficiency:   - stool guaiac negative.    - iron supplement.   Hemoglobin 7.1 on 11/25  Continue to monitor 16. Morbid obesiy-BMI-48.6: Educate patient on appropriate diet and importance of activity to help promote health and mobility. 17.  Hyperkalemia  Potassium 5.2 on 11/25  Labs ordered for tomorrow   LOS: 6 days A FACE TO FACE EVALUATION WAS PERFORMED  Steffany Schoenfelder Lorie Phenix 04/16/2018, 10:42 AM

## 2018-04-16 NOTE — Progress Notes (Signed)
Occupational Therapy Session Note  Patient Details  Name: [name] [name] MRN: [id number] Date of Birth: 1975-07-28  Today's Date: 04/16/2018  Session 1 OT Individual Time: 1000-1100 OT Individual Time Calculation (min): 60 min   Session 2 OT Individual Time: 1445-1530 OT Individual Time Calculation (min): 45 min    Short Term Goals: Week 1:  OT Short Term Goal 1 (Week 1): Pt will don pants with mod A +1 OT Short Term Goal 2 (Week 1): Pt will don shirt sitting EOB with (S) for improved sitting balance OT Short Term Goal 3 (Week 1): Pt will complete squat pivot to w/c with max +1  Skilled Therapeutic Interventions/Progress Updates:  Session 1   Pt greeted sitting in wc and agreeable to OT treatment session. Pt agreeable to bathing/dressing. Pt collected clothing at wc level from dresser. Discussed clothing management and options for LB dressing. Bathing completed wc at the sink with pt able to perform UB bathing. Worked on UB strengthening with wc pushups to lift bottom while OT assisted with washing buttocks.  Pt then reported need to urinate, so pt completed lateral scooting transfer to drop arm BSC with max A to position and maintain position of wc and BSC, but pt able to manage actual transfer with min A and increased time. Pt voided bladder, then worked on toileting strategies with leaning method with pt able to perform peri-care with set-up A. LB dressing seated on BSC. Pt able to to thread B LEs into pants, then tried lateral leans to pull pants up but unable to get past hips. Pt with good power up into partial stand while OT assisted with pulling pants over hips. Lateral transfer back to wc in similar fashion as above. Pt finished grooming tasks at the sink and was left seated in wc with needs met.   Session 2 Pt greeted seated on BSC after successful BM and bladder. Worked on toileting strategies with leaning and pt able to complete peri-care. Pt able to lift buttocks off BSC  while OT assisted with pulling pants over hips. Pt transferred back to drop arm wc with min A and assistance to maintain position of wc and BSC. UB strengthening with wc propulsion to therapy gym. Pt completed 5 mins x2 level 3 on [employer] arm bike. One, 3 minute rest break in between sets. Pt brought to standing frame and completed 3 sit<>stands with min A and tolerated 1-2 mins at each stand.  Pt returned to room and left seated in wc with L residual limb supported and needs met.   Therapy Documentation Precautions:  Precautions Precautions: Fall Restrictions Weight Bearing Restrictions: No RLE Weight Bearing: Weight bearing as tolerated LLE Weight Bearing: Non weight bearing(s/p amputation) Pain: Pain Assessment Pain Scale: 0-10 Pain Score: 9  Pain Type: Acute pain Pain Location: Leg Pain Orientation: Left Pain Descriptors / Indicators: Aching Pain Onset: With Activity Pain Intervention(s): Repositioned;RN made aware  Therapy/Group: Individual Therapy  Valma Cava 04/16/2018, 3:33 PM

## 2018-04-16 NOTE — Progress Notes (Signed)
Physical Therapy Session Note  Patient Details  Name: [name] [name] MRN: [id number] Date of Birth: [birth year]  Today's Date: 04/16/2018 PT Individual Time: 0800-0900 PT Individual Time Calculation (min): 60 min   Short Term Goals: Week 1:  PT Short Term Goal 1 (Week 1): pt will transfer with assistance of 1 person bed>< w/c PT Short Term Goal 2 (Week 1): pt will propel w/c x 100' over level tile with supervision PT Short Term Goal 3 (Week 1): pt will participate in consultation with prosthetist regarding L prosthesis  Skilled Therapeutic Interventions/Progress Updates:    Pt supine in bed upon PT arrival, agreeable to therapy tx and denies pain at rest. Pt reports having to use bathroom, pt transferred to sitting with min assist and lateral scoot onto bedside commode with min assist, verbal cues for techniques. Pt continent of bowel and bladder this session, pt performed anterior lean while therapist performs peri care dependent. Pt transferred back to bed with min assist and then transferred to w/c lateral scoot min assist. Pt propelled w/c from room>gym x 150 ft with supervision using B UEs, increased time, working on endurance. Therapist adjusted L LE legt rest to promote better positioning and elevation of residual limb. Therapist wrapped L residual limb with ace wrap for edema control and educated pt on having something on LE at all times. Pt performed 2 x 10 L LE LAQ for strengthening and ROM. Pt propelled to dayroom and used UE bike x 5 minutes on level 4 working on endurance/activity tolerance. Pt transported back to room and left seated with needs in reach and NT present.   Therapy Documentation Precautions:  Precautions Precautions: Fall Restrictions Weight Bearing Restrictions: Yes RLE Weight Bearing: Partial weight bearing LLE Weight Bearing: Non weight bearing(s/p amputation)    Therapy/Group: Individual Therapy  Netta Corrigan, PT, DPT 04/16/2018, 7:42 AM

## 2018-04-17 ENCOUNTER — Inpatient Hospital Stay (HOSPITAL_COMMUNITY): Payer: Self-pay | Admitting: Occupational Therapy

## 2018-04-17 ENCOUNTER — Inpatient Hospital Stay (HOSPITAL_COMMUNITY): Payer: Self-pay | Admitting: Physical Therapy

## 2018-04-17 DIAGNOSIS — D62 Acute posthemorrhagic anemia: Secondary | ICD-10-CM

## 2018-04-17 LAB — BASIC METABOLIC PANEL
ANION GAP: 3 — AB (ref 5–15)
BUN: 18 mg/dL (ref 6–20)
CO2: 27 mmol/L (ref 22–32)
Calcium: 8.3 mg/dL — ABNORMAL LOW (ref 8.9–10.3)
Chloride: 105 mmol/L (ref 98–111)
Creatinine, Ser: 0.98 mg/dL (ref 0.44–1.00)
GFR calc Af Amer: >60 mL/min (ref >60)
GFR calc non Af Amer: >60 mL/min (ref >60)
GLUCOSE: 107 mg/dL — AB (ref 70–99)
POTASSIUM: 5.2 mmol/L — AB (ref 3.5–5.1)
Sodium: 135 mmol/L (ref 135–145)

## 2018-04-17 LAB — CBC
HEMATOCRIT: 25.4 % — AB (ref 36.0–46.0)
Hemoglobin: 7.2 g/dL — ABNORMAL LOW (ref 12.0–15.0)
MCH: 23.8 pg — AB (ref 26.0–34.0)
MCHC: 28.3 g/dL — ABNORMAL LOW (ref 30.0–36.0)
MCV: 84.1 fL (ref 80.0–100.0)
NRBC: 0 % (ref 0.0–0.2)
Platelets: 346 10*3/uL (ref 150–400)
RBC: 3.02 MIL/uL — AB (ref 3.87–5.11)
RDW: 15 % (ref 11.5–15.5)
WBC: 5 10*3/uL (ref 4.0–10.5)

## 2018-04-17 LAB — CBC WITH DIFFERENTIAL/PLATELET
ABS IMMATURE GRANULOCYTES: 0.03 10*3/uL (ref 0.00–0.07)
Basophils Absolute: 0 10*3/uL (ref 0.0–0.1)
Basophils Relative: 1 %
Eosinophils Absolute: 0.1 10*3/uL (ref 0.0–0.5)
Eosinophils Relative: 3 %
HCT: 22.6 % — ABNORMAL LOW (ref 36.0–46.0)
Hemoglobin: 6.4 g/dL — CL (ref 12.0–15.0)
IMMATURE GRANULOCYTES: 1 %
LYMPHS ABS: 1.7 10*3/uL (ref 0.7–4.0)
LYMPHS PCT: 34 %
MCH: 23.8 pg — ABNORMAL LOW (ref 26.0–34.0)
MCHC: 28.3 g/dL — ABNORMAL LOW (ref 30.0–36.0)
MCV: 84 fL (ref 80.0–100.0)
Monocytes Absolute: 0.5 10*3/uL (ref 0.1–1.0)
Monocytes Relative: 10 %
NEUTROS ABS: 2.6 10*3/uL (ref 1.7–7.7)
NEUTROS PCT: 51 %
PLATELETS: 309 10*3/uL (ref 150–400)
RBC: 2.69 MIL/uL — ABNORMAL LOW (ref 3.87–5.11)
RDW: 14.9 % (ref 11.5–15.5)
WBC: 5 10*3/uL (ref 4.0–10.5)
nRBC: 0 % (ref 0.0–0.2)

## 2018-04-17 LAB — GLUCOSE, CAPILLARY
GLUCOSE-CAPILLARY: 117 mg/dL — AB (ref 70–99)
Glucose-Capillary: 90 mg/dL (ref 70–99)
Glucose-Capillary: 95 mg/dL (ref 70–99)
Glucose-Capillary: 104 mg/dL — ABNORMAL HIGH (ref 70–99)

## 2018-04-17 LAB — PREPARE RBC (CROSSMATCH)

## 2018-04-17 MED ORDER — DIPHENHYDRAMINE HCL 25 MG PO CAPS
25.0000 mg | ORAL_CAPSULE | Freq: Once | ORAL | Status: AC
  Administered 2018-04-17: 25 mg via ORAL
  Filled 2018-04-17: qty 1

## 2018-04-17 MED ORDER — SODIUM CHLORIDE 0.9% IV SOLUTION
Freq: Once | INTRAVENOUS | Status: AC
  Administered 2018-04-17: 09:00:00 via INTRAVENOUS

## 2018-04-17 MED ORDER — SODIUM CHLORIDE 0.9% IV SOLUTION: Freq: Once | INTRAVENOUS | Status: DC

## 2018-04-17 MED ORDER — LIDOCAINE 5 % EX PTCH
1.0000 | MEDICATED_PATCH | CUTANEOUS | Status: DC
  Administered 2018-04-17 – 2018-05-01 (×15): 1 via TRANSDERMAL
  Filled 2018-04-17 (×15): qty 1

## 2018-04-17 MED ORDER — ACETAMINOPHEN 325 MG PO TABS
650.0000 mg | ORAL_TABLET | Freq: Once | ORAL | Status: AC
  Administered 2018-04-17: 650 mg via ORAL
  Filled 2018-04-17: qty 2

## 2018-04-17 MED ORDER — FUROSEMIDE 10 MG/ML IJ SOLN
20.0000 mg | Freq: Two times a day (BID) | INTRAMUSCULAR | Status: AC
  Administered 2018-04-17 (×2): 20 mg via INTRAVENOUS
  Filled 2018-04-17 (×2): qty 2

## 2018-04-17 NOTE — Plan of Care (Signed)
  Problem: Consults Goal: RH GENERAL PATIENT EDUCATION Description See Patient Education module for education specifics. Outcome: Progressing   Problem: RH BOWEL ELIMINATION Goal: RH STG MANAGE BOWEL W/MEDICATION W/ASSISTANCE Description STG Manage Bowel with Medication with mod  Assistance.  Outcome: Progressing   Problem: RH SKIN INTEGRITY Goal: RH STG MAINTAIN SKIN INTEGRITY WITH ASSISTANCE Description STG Maintain Skin Integrity With min Assistance.  Outcome: Progressing   Problem: RH SAFETY Goal: RH STG ADHERE TO SAFETY PRECAUTIONS W/ASSISTANCE/DEVICE Description STG Adhere to Safety Precautions With min Assistance/Device.  Outcome: Progressing   Problem: RH PAIN MANAGEMENT Goal: RH STG PAIN MANAGED AT OR BELOW PT'S PAIN GOAL Description Pt will report pain less than 2 with min assistance  Outcome: Progressing

## 2018-04-17 NOTE — Progress Notes (Signed)
Physical Therapy Weekly Progress Note  Patient Details  Name: [name] [name] MRN: [id number] Date of Birth: 1976/03/13  Beginning of progress report period: April 11, 2018 End of progress report period: April 17, 2018  Patient has met 3 of 3 short term goals.  Pt has improved transfers to supervision/min A level, unable to perform gait due to prosthesis not fitting but has consult with prosthetist to assess issues.  Patient continues to demonstrate the following deficits muscle weakness and decreased standing balance, decreased postural control and decreased balance strategies and therefore will continue to benefit from skilled PT intervention to increase functional independence with mobility.  Patient progressing toward long term goals..  Continue plan of care.  PT Short Term Goals Week 1:  PT Short Term Goal 1 (Week 1): pt will transfer with assistance of 1 person bed>< w/c PT Short Term Goal 1 - Progress (Week 1): Met PT Short Term Goal 2 (Week 1): pt will propel w/c x 100' over level tile with supervision PT Short Term Goal 2 - Progress (Week 1): Met PT Short Term Goal 3 (Week 1): pt will participate in consultation with prosthetist regarding L prosthesis PT Short Term Goal 3 - Progress (Week 1): Met Week 2:  PT Short Term Goal 1 (Week 2): pt will ambulate x 10' with prosthesis and min A for bathroom mobility at home PT Short Term Goal 2 (Week 2): pt will perform stand pivot transfers wtih min A  Skilled Therapeutic Interventions/Progress Updates:  Ambulation/gait training;Community reintegration;DME/adaptive equipment instruction;Neuromuscular re-education;Psychosocial support;Stair training;UE/LE Strength taining/ROM;Wheelchair propulsion/positioning;Balance/vestibular training;Discharge planning;Functional electrical stimulation;Pain management;Therapeutic Activities;UE/LE Coordination activities;Functional mobility training;Patient/family  education;Splinting/orthotics;Therapeutic Exercise     [name] 04/17/2018, 10:59 AM

## 2018-04-17 NOTE — Progress Notes (Signed)
[facility] PHYSICAL MEDICINE & REHABILITATION PROGRESS NOTE   Subjective/Complaints: Patient seen sitting up in bed this morning.  She states she slept well overnight.  She has questions about her hemoglobin.  ROS: Denies CP, SOB, N/V/D  Objective:   No results found. Recent Labs    04/17/18 0414 04/17/18 0803  WBC 5.0 5.0  HGB 6.4* 7.2*  HCT 22.6* 25.4*  PLT 309 346   Recent Labs    04/16/18 0416 04/17/18 0414  NA 137 135  K 5.2* 5.2*  CL 107 105  CO2 27 27  GLUCOSE 108* 107*  BUN 17 18  CREATININE 0.92 0.98  CALCIUM 8.4* 8.3*    Intake/Output Summary (Last 24 hours) at 04/17/2018 0915 Last data filed at 04/17/2018 0818 Gross per 24 hour  Intake 1440 ml  Output -  Net 1440 ml     Physical Exam: Vital Signs Blood pressure (!) 151/95, pulse 97, temperature 97.8 F (36.6 C), resp. rate 19, height 5\' 10"  (1.778 m), weight (!) 154.9 kg, SpO2 97 %. Constitutional: No distress . Vital signs reviewed. HENT: Normocephalic.  Atraumatic. Eyes: EOMI. No discharge. Cardiovascular: RRR.  No JVD. Respiratory: CTA bilaterally.  Normal effort. GI: BS +. Non-distended. Musc: No edema or tenderness in extremities. Musculoskeletal: She exhibitsedema and tenderness, stable. Left leg tender, scar along incision. Tender to palpation.    Right foot with chronic wound Neurological: She isalertand oriented  Motor: B/l UE 5/5, stable.  LLE: HF 4-/5, limited by pain, stable.  RLE: 4/5 prox to distal with pain inhibition.  Skin: She isnot diaphoretic.  Left stump with dark skin proximal and distal to the incision site Psychiatric:Normal mood.  Normal behavior.   Assessment/Plan: 1. Functional deficits secondary to LLE myositis which require 3+ hours per day of interdisciplinary therapy in a comprehensive inpatient rehab setting.  Physiatrist is providing close team supervision and 24 hour management of active medical problems listed below.  Physiatrist and rehab team  continue to assess barriers to discharge/monitor patient progress toward functional and medical goals  Care Tool:  Bathing    Body parts bathed by patient: Left arm, Right arm, Chest, Abdomen, Front perineal area, Face, Right upper leg, Left upper leg   Body parts bathed by helper: Right lower leg Body parts n/a: Left lower leg, Buttocks   Bathing assist Assist Level: Minimal Assistance - Patient > 75%     Upper Body Dressing/Undressing Upper body dressing   What is the patient wearing?: Hospital gown only    Upper body assist Assist Level: Supervision/Verbal cueing    Lower Body Dressing/Undressing Lower body dressing      What is the patient wearing?: Incontinence brief     Lower body assist Assist for lower body dressing: Maximal Assistance - Patient 25 - 49%     Toileting Toileting    Toileting assist Assist for toileting: Total Assistance - Patient < 25%     Transfers Chair/bed transfer  Transfers assist     Chair/bed transfer assist level: Minimal Assistance - Patient > 75%     Locomotion Ambulation   Ambulation assist   Ambulation activity did not occur: Safety/medical concerns          Walk 10 feet activity   Assist           Walk 50 feet activity   Assist           Walk 150 feet activity   Assist  Walk 10 feet on uneven surface  activity   Assist           Wheelchair     Assist Will patient use wheelchair at discharge?: Yes Type of Wheelchair: Manual    Wheelchair assist level: Supervision/Verbal cueing Max wheelchair distance: 150 ft    Wheelchair 50 feet with 2 turns activity    Assist    Wheelchair 50 feet with 2 turns activity did not occur: Safety/medical concerns(Per report )   Assist Level: Supervision/Verbal cueing   Wheelchair 150 feet activity     Assist Wheelchair 150 feet activity did not occur: Safety/medical concerns(Per report )   Assist Level:  Supervision/Verbal cueing     Medical Problem List and Plan: 1.Functional and mobility deficitssecondary to left lower extremity myositis/strep bacteremia -continue with CIR 2. DVT Prophylaxis/Anticoagulation: Pharmaceutical:Lovenox 3.Chronic back pain/Management: Continue oxycodone as needed  -pt is working through pain  4. Mood:LCSW to follow for evaluation and support. Egosupport to be provided by team 5. Neuropsych: This patientiscapable of making decisions on herown behalf. 6. Skin/Wound Care:Routine pressure relief measures.    -pt aware of shifting, boosting 7. Fluids/Electrolytes/Nutrition:Monitor I's and O's.  -encourage PO intake 8.Group B strep agalactiaebacteremia: Continue IV Ceftriaxone with end date 11/28  WBCs 5.0 on 11/26 9.T2DM with neuropathy: We will monitor blood sugars AC at bedtime.     Lantus twice daily decreased to 15 units twice daily  Labile on 11/26 10.Chronic constipation: BM 11/19.  Will continue to augment back bowel program 11.HTN: Monitor blood pressures twice daily.   Bumex restarted  Labile on 11/26 12.Peripheral edema: Monitor weights daily. -compression/elevation of LE's 13.Bipolar disorder with depression: Continue Prozac 14.Acute on?  Chronic renal failure:   Creatinine 0.98 on 11/26  Continue to monitor 15.Anemia of chronic disease/iron deficiency:   - stool guaiac negative.    - iron supplement.   Hemoglobin critical value of 6.4 on 11/26, repeat 7.2  Will transfuse today  Continue to monitor 16. Morbid obesiy-BMI-48.6: Educate patient on appropriate diet and importance of activity to help promote health and mobility. 17.  Hyperkalemia  Potassium 5.2 on 11/26  LOS: 7 days A FACE TO FACE EVALUATION WAS PERFORMED  Eljay Lave Lorie Phenix 04/17/2018, 9:15 AM

## 2018-04-17 NOTE — Progress Notes (Signed)
Occupational Therapy Weekly Progress Note  Patient Details  Name: [name] [name] MRN: [id number] Date of Birth: 05/08/1976  Beginning of progress report period: April 11, 2018 End of progress report period: April 17, 2018  Today's Date: 04/17/2018  Session 1 OT Individual Time: 1100-1210 OT Individual Time Calculation (min): 70 min   Session 2 OT Individual Time: 1350-1455 OT Individual Time Calculation (min): 65 min    Patient has met 1 of 3 short term goals.  Pt is making slow progress towards OT goals at this time. Pt has been limited by pain and fatigue. Pt has made progress in lateral scooting transfers to an overall min A, but we are working on standing and squat-pivots within BADL tasks. Pt's largest BADL barrier is LB BADLs 2/2 body habitus, standing balance, and endurance. Continue current POC.   Patient continues to demonstrate the following deficits: muscle weakness and decreased activity tolerance, and decreased sitting balance, decreased standing balance and decreased balance strategies and therefore will continue to benefit from skilled OT intervention to enhance overall performance with BADL and iADL.  Patient progressing toward long term goals..  Continue plan of care.  OT Short Term Goals Week 1:  OT Short Term Goal 1 (Week 1): Pt will don pants with mod A +1 OT Short Term Goal 1 - Progress (Week 1): Progressing toward goal OT Short Term Goal 2 (Week 1): Pt will don shirt sitting EOB with (S) for improved sitting balance OT Short Term Goal 2 - Progress (Week 1): Met OT Short Term Goal 3 (Week 1): Pt will complete squat pivot to w/c with max +1 OT Short Term Goal 3 - Progress (Week 1): Progressing toward goal Week 2:  OT Short Term Goal 1 (Week 2): Pt will complete squat pivot to w/c with max +1 OT Short Term Goal 2 (Week 2): Pt will don pants with mod A +1 OT Short Term Goal 3 (Week 2): Pt will complete sit<>stand with RW with Max A +1 in preparation for  BADL tasks OT Short Term Goal 4 (Week 2): Pt will complete toileting with min A  Skilled Therapeutic Interventions/Progress Updates:  Session 1   Pt greeted sitting in wc and agreeable to OT treatment session focused on modified bathing/dressing tasks. Pt reports feeling tired today and is currently receiving blood transfusion during session. Pt propelled wc to the sink and completed UB bathing with set-up A at wc level. Discussed options for LB bathing in home environment as pt will have to ambulate into bathroom. Pt also states she was sitting down in tub PTA and OT dicussed that this would not be a safe option for bathing at dc. Pt Worked on lateral leans to wash buttocks which pt was able to do in wc, but needed OT assist to ensure thoroughness. Wc pushups within LB BADLs for UB strengthening and pressure relief. Removed stump protector for prosthesis to check skin integrity and wash stump. Noted 2 areas of redness and notified nursing staff who placed foam pads. Discussed pressure relief for L residual limb as well. Worked on L residual limb wrapping using ace wrap for swelling. Gown donned today instead of clothing 2/2 IV. Pt left seated in wc at end of session with needs met.   Session 2 Pt greeted seated in wc and agreeable to OT treatment session with COTX from Rec therapist. B UE strength/coordination with wc propulsion to therapy gym. Worked on sit<>stands with RW in preparation for BADL tasks. Pt needed mod  A  to come into standing with +2 used to stabilize bariatric RW. Pt tolerated standing for 2 mins, then worked on balance reaching behind in simulated dressing tasks. On second stand, pt stood with mod A of 1. Pt stood for 5 mins. Extended seated rest break. 3rd sit<>stand w/ RW and mod A. Had pt write grocery list for favorite meal while standing with min A for balance-tolerated standing for 10 mins. Pt returned to room and left seated in wc with needs met.   Therapy  Documentation Precautions:  Precautions Precautions: Fall Restrictions Weight Bearing Restrictions: No RLE Weight Bearing: Weight bearing as tolerated LLE Weight Bearing: Non weight bearing(s/p amputation) General:  Pain: Pain Assessment Pain Scale: 0-10 Pain Score: 6  Pain Type: Acute pain Pain Location: Knee Pain Orientation: Left Pain Descriptors / Indicators: Aching Pain Onset: On-going Pain Intervention(s): Repositioned   Therapy/Group: Individual Therapy  Valma Cava 04/17/2018, 3:03 PM

## 2018-04-17 NOTE — Progress Notes (Signed)
Physical Therapy Session Note  Patient Details  Name: [name] [name] MRN: [id number] Date of Birth: [birth year]  Today's Date: 04/17/2018 PT Individual Time: 0800-0915 PT Individual Time Calculation (min): 75 min   Short Term Goals: Week 1:  PT Short Term Goal 1 (Week 1): pt will transfer with assistance of 1 person bed>< w/c PT Short Term Goal 2 (Week 1): pt will propel w/c x 100' over level tile with supervision PT Short Term Goal 3 (Week 1): pt will participate in consultation with prosthetist regarding L prosthesis  Skilled Therapeutic Interventions/Progress Updates:    pt rec'd in bed, in significant pain but agreeable to treatment. RN aware of pain, meds given prior to session and heat applied after session.  Pt donned prosthesis with set up assist, max A to stand with RW with prosthesis. Pt states prosthetic feels "loose" despite use of socks. Deferred gait with prosthesis due to prosthetic fit.  Transfers throughout session with supervision, increased time.  Supine therex for bilat LE and core strengthening with AAROM for bilat LEs due to pain and weakness. Pt left in w/c with needs at hand, heat applied to painful Lt knee.    Therapy Documentation Precautions:  Precautions Precautions: Fall Restrictions Weight Bearing Restrictions: No RLE Weight Bearing: Weight bearing as tolerated LLE Weight Bearing: Non weight bearing(s/p amputation) Pain: Pain Assessment Pain Scale: 0-10 Pain Score: 9  Pain Location: Knee Pain Orientation: Left Repositioned, RN made aware, heat applied after session   Therapy/Group: Individual Therapy  Shakeya Kerkman 04/17/2018, 9:15 AM

## 2018-04-17 NOTE — Evaluation (Signed)
Recreational Therapy Assessment and Plan  Patient Details  Name: [name] [name] MRN: [id number] Date of Birth: 1975/10/18 Today's Date: 04/17/2018  Rehab Potential: Good ELOS: discharge 12/10   Assessment  Problem List:      Patient Active Problem List   Diagnosis Date Noted  . Debility 04/10/2018  . Bacteremia due to group B Streptococcus 04/04/2018  . Severe protein-calorie malnutrition ([place])   . BKA stump complication ([place]) 35/46/5681  . Vaginal candidiasis 03/12/2018  . Herpes genitalis in women 01/31/2018  . Herpes simplex type 1 infection 01/16/2018  . Status post below knee amputation, left ([place]) 04/11/2017  . Bipolar affective disorder ([place])   . Adjustment disorder with depressed mood 03/26/2017  . Abnormal ECG 03/24/2017  . Sepsis ([place]) 03/24/2017  . Heart failure with preserved ejection fraction ([place]) 02/09/2017  . Routine adult health maintenance 12/08/2016  . Chronic pain of right knee 09/08/2016  . Chronic kidney disease (CKD), stage III (moderate) ([place]) 07/15/2016  . OSA (obstructive sleep apnea) 05/13/2016  . Morbid obesity due to excess calories ([place]) 11/27/2015  . History of Low serum cortisol level ([place]) 01/07/2015  . Hypothyroidism 04/25/2013  . Financial difficulty 04/25/2013  . Acute renal failure ([place]) 07/12/2012  . Uncontrolled type 2 diabetes mellitus ([place]) 07/12/2012  . Necrotizing fasciitis s/p OR debridements 07/06/2012  . Carpal tunnel syndrome, bilateral 01/25/2012  . Diabetic peripheral neuropathy ([place]) 07/04/2011  . Anxiety and depression 06/02/2011  . History of abnormal cervical Pap smear 12/13/2007  . GERD 12/06/2007  . Hyperlipidemia 08/29/2007  . Hypertension associated with diabetes ([place]) 08/29/2007    Past Medical History:      Past Medical History:  Diagnosis Date  . Abdominal muscle pain 09/08/2016  . Abnormal Pap smear of cervix 2009  . Abscess    history of multiple abscesses  . Acute bilateral low back pain  02/13/2017  . Acute blood loss anemia   . Anemia of chronic disease 2002  . Anxiety    Panic attacks  . Bilateral lower extremity edema 05/13/2016  . Bipolar disorder ([place])   . Cellulitis 05/21/2014   right eye  . Chronic bronchitis ([place])    "get it q yr" (05/13/2013)  . Chronic pain   . Depression   . Edema of lower extremity   . Endocarditis 2002   subacute bacterial endocarditis.   . Family history of anesthesia complication    "my mom has a hard time coming out from under"  . Fibromyalgia   . GERD (gastroesophageal reflux disease)    occ  . Heart murmur   . History of blood transfusion    "just low blood count" (05/13/2013)  . Hyperlipidemia   . Hypertension   . Hypothyroidism   . Hypothyroidism, adult 03/21/2014  . Leukocytosis   . Necrosis ([place])    and ulceration  . Obesity   . OSA on CPAP    does not wear CPAP  . Peripheral neuropathy   . Type II diabetes mellitus ([place])    Type  II   Past Surgical History:       Past Surgical History:  Procedure Laterality Date  . AMPUTATION Left 04/07/2017   Procedure: LEFT BELOW KNEE AMPUTATION;  Surgeon: Newt Minion, MD;  Location: [facility];  Service: Orthopedics;  Laterality: Left;  . EYE SURGERY     l[place]  . INCISION AND DRAINAGE ABSCESS     multiple I&Ds  . INCISION AND DRAINAGE ABSCESS Left 07/09/2012   Procedure: DRESSING  CHANGE, THIGH WOUND;  Surgeon: Harl Bowie, MD;  Location: [facility];  Service: General;  Laterality: Left;  . INCISION AND DRAINAGE OF WOUND Left 07/07/2012   Procedure: IRRIGATION AND DEBRIDEMENT WOUND;  Surgeon: Harl Bowie, MD;  Location: [facility];  Service: General;  Laterality: Left;  . INCISION AND DRAINAGE PERIRECTAL ABSCESS Left 07/14/2012   Procedure: DEBRIDEMENT OF SKIN & SOFT TISSUE; DRESSING CHANGE UNDER ANESTHESIA;  Surgeon: Gayland Curry, MD,FACS;  Location: [facility];  Service: General;  Laterality: Left;  . INCISION AND DRAINAGE PERIRECTAL  ABSCESS Left 07/16/2012   Procedure: I&D Left Thigh;  Surgeon: Gwenyth Ober, MD;  Location: [facility];  Service: General;  Laterality: Left;  . INCISION AND DRAINAGE PERIRECTAL ABSCESS N/A 01/05/2015   Procedure: IRRIGATION AND DEBRIDEMENT PERIRECTAL ABSCESS;  Surgeon: Donnie Mesa, MD;  Location: [facility];  Service: General;  Laterality: N/A;  . IRRIGATION AND DEBRIDEMENT ABSCESS Left 07/06/2012   Procedure: IRRIGATION AND DEBRIDEMENT ABSCESS BUTTOCKS AND THIGH;  Surgeon: Shann Medal, MD;  Location: [facility];  Service: General;  Laterality: Left;  . IRRIGATION AND DEBRIDEMENT ABSCESS Left 08/10/2012   Procedure: IRRIGATION AND DEBRIDEMENT ABSCESS;  Surgeon: Madilyn Hook, DO;  Location: [facility];  Service: General;  Laterality: Left;  . STUMP REVISION Left 06/09/2017   Procedure: REVISION LEFT BELOW KNEE AMPUTATION;  Surgeon: Newt Minion, MD;  Location: [facility];  Service: Orthopedics;  Laterality: Left;  . TONSILLECTOMY  1994    Assessment & Plan Clinical Impression: [name] [name] a 55 y.owith history of T2DM, OSA, CKD, bipolar disorder, morbid obesity--BMI 50, ongoing tobacco use, left BKA--CIR 03/2018 with recent falls and increasing edema and pain in left arm. She was admitted on 04/03/2018 with fever due to sepsis from group B bacteremia. She was started on broad-spectrum antibiotics and MRI of left lower extremity done showing generalized soft tissue edema and distal thigh with severe muscle edema and residual gastrocnemius question infectious versus inflammatory myositis. Dr. Sharol Given felt that there was no need for surgical intervention and antibiotics narrowed to ceftriaxone with recommendations to complete 2 weeks antibiotic course--end date 11/28. Patient has had issues with nausea with poor appetite,acute on chronic anemia requiring transfusion with 1 units,acute on chronic renal failure treated with IV fluids,poorly controlled blood sugars as well as issues with constipation  Patient transferred to CIR on 04/10/2018   Pt presents with decreased activity tolerance, decreased functional mobility, decreased balance Limiting pt's independence with leisure/community pursuits.  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 Expression Interests: Music (Comment) Other Leisure Interests: Television;Movies;Computer;Cooking/Baking Leisure Participation Style: Alone;With Family/Friends Awareness of Community Resources: Good-identify 3 post discharge leisure resources Psychosocial / Spiritual Stress Management: Good Social interaction - Mood/Behavior: Cooperative Academic librarian Appropriate for Education?: Yes Patient Agreeable to Outing?: Yes Recreational Therapy Orientation Orientation -Reviewed with patient: Available activity resources Strengths/Weaknesses Patient Strengths/Abilities: Willingness to participate Patient weaknesses: Physical limitations TR Patient demonstrates impairments in the following area(s): Edema;Endurance;Pain;Safety;Skin Integrity  Plan Rec Therapy Plan Is patient appropriate for Therapeutic Recreation?: Yes Rehab Potential: Good Treatment times per week: Min 1 TR sesison >20 minutes during LOS Estimated Length of Stay: discharge 12/10 TR Treatment/Interventions: Adaptive equipment instruction;Group participation (Comment);Therapeutic exercise;Wheelchair propulsion/positioning;1:1 session;Community reintegration;Recreation/leisure participation;UE/LE Chartered certified accountant training;Functional mobility training;Patient/family education;Therapeutic activities  Recommendations for other services: None   Discharge Criteria: Patient will be discharged from TR if patient refuses treatment 3 consecutive times without medical reason.  If treatment goals not met, if  there is a change in medical status, if patient makes no progress  towards goals or if patient is discharged from hospital.  The above assessment, treatment plan, treatment alternatives and goals were discussed and mutually agreed upon: by patient  [place] 04/17/2018, 4:10 PM

## 2018-04-18 ENCOUNTER — Inpatient Hospital Stay (HOSPITAL_COMMUNITY): Payer: Self-pay

## 2018-04-18 ENCOUNTER — Inpatient Hospital Stay (HOSPITAL_COMMUNITY): Payer: Self-pay | Admitting: Occupational Therapy

## 2018-04-18 ENCOUNTER — Encounter (HOSPITAL_COMMUNITY): Payer: Self-pay | Admitting: Psychology

## 2018-04-18 ENCOUNTER — Ambulatory Visit (HOSPITAL_COMMUNITY): Payer: Self-pay | Admitting: Occupational Therapy

## 2018-04-18 DIAGNOSIS — F321 Major depressive disorder, single episode, moderate: Secondary | ICD-10-CM

## 2018-04-18 DIAGNOSIS — E162 Hypoglycemia, unspecified: Secondary | ICD-10-CM

## 2018-04-18 LAB — TYPE AND SCREEN
ABO/RH(D): B POS
ANTIBODY SCREEN: NEGATIVE
UNIT DIVISION: 0
UNIT DIVISION: 0

## 2018-04-18 LAB — BPAM RBC
BLOOD PRODUCT EXPIRATION DATE: 201911292359
BLOOD PRODUCT EXPIRATION DATE: 201911292359
ISSUE DATE / TIME: 201911260925
ISSUE DATE / TIME: 201911261234
Unit Type and Rh: 7300
Unit Type and Rh: 7300

## 2018-04-18 LAB — CBC
HCT: 28.1 % — ABNORMAL LOW (ref 36.0–46.0)
Hemoglobin: 8.4 g/dL — ABNORMAL LOW (ref 12.0–15.0)
MCH: 24.9 pg — ABNORMAL LOW (ref 26.0–34.0)
MCHC: 29.9 g/dL — ABNORMAL LOW (ref 30.0–36.0)
MCV: 83.4 fL (ref 80.0–100.0)
NRBC: 0 % (ref 0.0–0.2)
PLATELETS: 304 10*3/uL (ref 150–400)
RBC: 3.37 MIL/uL — AB (ref 3.87–5.11)
RDW: 14.6 % (ref 11.5–15.5)
WBC: 5.5 10*3/uL (ref 4.0–10.5)

## 2018-04-18 LAB — GLUCOSE, CAPILLARY
GLUCOSE-CAPILLARY: 59 mg/dL — AB (ref 70–99)
GLUCOSE-CAPILLARY: 75 mg/dL (ref 70–99)
GLUCOSE-CAPILLARY: 62 mg/dL — AB (ref 70–99)
GLUCOSE-CAPILLARY: 91 mg/dL (ref 70–99)
Glucose-Capillary: 91 mg/dL (ref 70–99)
Glucose-Capillary: 159 mg/dL — ABNORMAL HIGH (ref 70–99)

## 2018-04-18 MED ORDER — NAPROXEN 250 MG PO TABS
500.0000 mg | ORAL_TABLET | Freq: Two times a day (BID) | ORAL | Status: DC
  Administered 2018-04-18 – 2018-04-19 (×4): 500 mg via ORAL
  Filled 2018-04-18 (×5): qty 2

## 2018-04-18 MED ORDER — INSULIN GLARGINE 100 UNIT/ML ~~LOC~~ SOLN
5.0000 [IU] | Freq: Two times a day (BID) | SUBCUTANEOUS | Status: DC
  Administered 2018-04-19 – 2018-04-27 (×18): 5 [IU] via SUBCUTANEOUS
  Filled 2018-04-18 (×20): qty 0.05

## 2018-04-18 MED ORDER — INSULIN GLARGINE 100 UNIT/ML ~~LOC~~ SOLN: 10.0000 [IU] | Freq: Every day | SUBCUTANEOUS | Status: DC

## 2018-04-18 MED ORDER — OXYCODONE-ACETAMINOPHEN 5-325 MG PO TABS
2.0000 | ORAL_TABLET | ORAL | Status: DC | PRN
  Administered 2018-04-18 – 2018-04-30 (×38): 2 via ORAL
  Filled 2018-04-18 (×42): qty 2

## 2018-04-18 MED ORDER — METHOCARBAMOL 500 MG PO TABS
1000.0000 mg | ORAL_TABLET | Freq: Four times a day (QID) | ORAL | Status: DC
  Administered 2018-04-18 – 2018-04-22 (×19): 1000 mg via ORAL
  Filled 2018-04-18 (×20): qty 2

## 2018-04-18 MED ORDER — NAPROXEN 250 MG PO TABS: 250.0000 mg | ORAL_TABLET | Freq: Two times a day (BID) | ORAL | Status: DC

## 2018-04-18 NOTE — Patient Care Conference (Signed)
Inpatient RehabilitationTeam Conference and Plan of Care Update Date: 04/17/2018   Time: 10:35 am    Patient Name: [name] [name]      Medical Record Number: [id number]  Date of Birth: 14-Jun-1975 Sex: Female         Room/Bed: 4W04C/4W04C-01 Payor Info: Payor: MEDICARE / Plan: MEDICARE PART A AND B / Product Type: *No Product type* /    Admitting Diagnosis: Debility  Admit Date/Time:  04/10/2018  6:11 PM Admission Comments: No comment available   Primary Diagnosis:  <principal problem not specified> Principal Problem: <principal problem not specified>  Patient Active Problem List   Diagnosis Date Noted  . Hypoglycemia   . Acute blood loss anemia   . Hyperkalemia   . Morbid obesity ([place])   . Anemia of chronic disease   . Peripheral edema   . Essential hypertension   . Diabetes mellitus type 2 in obese ([place])   . Bacteremia   . Debility 04/10/2018  . Bacteremia due to group B Streptococcus 04/04/2018  . Severe protein-calorie malnutrition ([place])   . BKA stump complication ([place]) 62/13/0865  . Vaginal candidiasis 03/12/2018  . Herpes genitalis in women 01/31/2018  . Herpes simplex type 1 infection 01/16/2018  . Status post below knee amputation, left ([place]) 04/11/2017  . Bipolar affective disorder ([place])   . Adjustment disorder with depressed mood 03/26/2017  . Abnormal ECG 03/24/2017  . Sepsis ([place]) 03/24/2017  . Heart failure with preserved ejection fraction ([place]) 02/09/2017  . Routine adult health maintenance 12/08/2016  . Chronic pain of right knee 09/08/2016  . Chronic kidney disease (CKD), stage III (moderate) ([place]) 07/15/2016  . OSA (obstructive sleep apnea) 05/13/2016  . Morbid obesity due to excess calories ([place]) 11/27/2015  . History of Low serum cortisol level ([place]) 01/07/2015  . Hypothyroidism 04/25/2013  . Financial difficulty 04/25/2013  . Acute renal failure ([place]) 07/12/2012  . Uncontrolled type 2 diabetes mellitus ([place]) 07/12/2012  . Necrotizing  fasciitis s/p OR debridements 07/06/2012  . Carpal tunnel syndrome, bilateral 01/25/2012  . Diabetic peripheral neuropathy ([place]) 07/04/2011  . Anxiety and depression 06/02/2011  . History of abnormal cervical Pap smear 12/13/2007  . GERD 12/06/2007  . Hyperlipidemia 08/29/2007  . Hypertension associated with diabetes ([place]) 08/29/2007    Expected Discharge Date: Expected Discharge Date: 05/01/18  Team Members Present: Physician leading conference: Dr. Delice Lesch Social Worker Present: Lennart Pall, LCSW Nurse Present: Other (comment)(Jamie, RN) PT Present: Roderic Ovens, PT OT Present: Cherylynn Ridges, OT SLP Present: Weston Anna, SLP PPS Coordinator present : Gunnar Fusi, SLP)     Current Status/Progress Goal Weekly Team Focus  Medical   Functional and mobility deficits secondary to left lower extremity myositis/strep bacteremia  Improve mobility, safety, ABLA, hyperkalemia, CBGs, HTN, bacteremia  See above   Bowel/Bladder   cont of bowel ^ bladder, LBM 11/25  remain continent  continue to monitor   Swallow/Nutrition/ Hydration             ADL's   Min A transfers, Max A LB ADLs, +2 sit<>stand  supervision, mod/i  Bathing/dressing modifications, sit<>stand, toileting strategies   Mobility   min A transfers, +2 assist to stand with RW, supervision w/c  supervision transfers and w/c mobility, min A car transfer, no ambulation goals  strength, activity tolerance   Communication             Safety/Cognition/ Behavioral Observations            Pain   c/o pain  to left stump with activity 10/10, has percocet, tylenol, tramadol & robaxin prn  pain scale <4/10  assess & treat as needed   Skin   small vaginal area listed as a stage 2 from periwick on admission, dry skin,   no new skin break down  assess q shift    Rehab Goals Patient on target to meet rehab goals: Yes *See Care Plan and progress notes for long and short-term goals.     Barriers to Discharge  Current  Status/Progress Possible Resolutions Date Resolved   Physician    Medical stability;IV antibiotics     See above  Therapies, IV abx until 11/28, optimize DM meds, follow labs, transfusion today      Nursing                  PT                    OT                  SLP                SW                Discharge Planning/Teaching Needs:  Pt will d/c home with nephew who is currently unemployed and can assist, however, reliability of assistance may be a concern.  Will need education with nephew prior to d/c.   Team Discussion:  BP up/ down;  Watching labs.  Hgb down - transfusing.  IV abx still through 11/28.  Min assist lateral transfers.  Need adjustments made to prosthesis - calling vendor.  Hope to be able to don prosthetic to amb into bathroom.  Goals all supervision for mobility.  Recommend ramp at home.  Revisions to Treatment Plan:  NA    Continued Need for Acute Rehabilitation Level of Care: The patient requires daily medical management by a physician with specialized training in physical medicine and rehabilitation for the following conditions: Daily direction of a multidisciplinary physical rehabilitation program to ensure safe treatment while eliciting the highest outcome that is of practical value to the patient.: Yes Daily medical management of patient stability for increased activity during participation in an intensive rehabilitation regime.: Yes Daily analysis of laboratory values and/or radiology reports with any subsequent need for medication adjustment of medical intervention for : Post surgical problems;Diabetes problems;Other   I attest that I was present, lead the team conference, and concur with the assessment and plan of the team.   Monta Police 04/18/2018, 11:36 AM

## 2018-04-18 NOTE — Consult Note (Signed)
Neuropsychological Consultation   Patient:   [name] [name]   DOB:   05-05-1976  MR Number:  [id number]  Location:  [facility] [id number] [place] [address] Dept: [place]: [phone]           Date of Service:   04/18/2018  Start Time:   3 PM End Time:   4 PM  Provider/Observer:  Ilean Skill, Psy.D.       Clinical Neuropsychologist       Billing Code/Service: [phone] 4 Units  Chief Complaint:    [name] [name] is a 42 year old female with a history of type 2 diabetes, OSA, chronic kidney disease, bipolar disorder/depression, morbid obesity, and recent left below the knee amputation.  The patient has had recent falls and increased edema and pain in her left arm.  She was admitted on 04/03/2018 with fever due to sepsis from group B bacteremia.  MRI of the left lower extremity done showing generalized soft tissue edema and distal thigh with severe muscle edema and residual gastrocnemius with question of infection versus inflammatory myositis.  The patient had extended illness with debility developed and significant medical complications secondary to her sepsis.  The patient also has a prior history of diagnosis of bipolar disorder/depression and anxiety.  The patient has had a number of recent psychosocial issues over the past year.  A close friend passed away 1 year ago, patient had her lower left leg amputated and then her mother passed away.  Significant financial difficulties developed after her mother passed away and the patient has been struggling with major stressors over the past year.   Reason for Service:  The patient was referred for coping and adjustment issues following a long hospital stay as well as numerous psychosocial stressors on top of a prior history of bipolar disorder/depressive disorder and anxiety.  Below is the HPI for the current  admission.  AYT:KZSWFUXNAT C XXXClyburrnis a 45 y.owith history of T2DM, OSA, CKD, bipolar disorder, morbid obesity--BMI 50, ongoing tobacco use, left BKA--CIR 03/2018 with recent falls and increasing edema and pain in left arm. She was admitted on 04/03/2018 with fever due to sepsis from group B bacteremia. She was started on broad-spectrum antibiotics and MRI of left lower extremity done showing generalized soft tissue edema and distal thigh with severe muscle edema and residual gastrocnemius question infectious versus inflammatory myositis. Dr. Sharol Given felt that there was no need for surgical intervention and antibiotics narrowed to ceftriaxone with recommendations to complete 2 weeks antibiotic course--end date 11/28. Patient has had issues with nausea with poor appetite,acute on chronic anemia requiring transfusion with 1 units,acute on chronic renal failure treated with IV fluids,poorly controlled blood sugars as well as issues with constipation. Patient noted to be debilitated and therapy has been ongoing. CIR recommended for follow-up therapy  Current Status:  The patient reports an describe significant depression and anxiety over the past year.  The patient reports that her current medical issues likely are related to this as the patient has become overwhelmed with depression and was not taking care of herself very well.  The patient is struggled after the death of her mother.  The patient reports that she only went in to the emergency room because of severe pain and then found out how sick she was.  Behavioral Observation: [name] [name]  presents as a 42 y.o.-year-old Right African American Female who appeared her stated age. her dress  was Appropriate and she was Well Groomed and her manners were Appropriate to the situation.  her participation was indicative of Appropriate and Attentive behaviors.  There were any physical disabilities noted.  she displayed an appropriate level of  cooperation and motivation.     Interactions:    Active Appropriate  Attention:   within normal limits and attention span and concentration were age appropriate  Memory:   within normal limits; recent and remote memory intact  Visuo-spatial:  not examined  Speech (Volume):  low  Speech:   normal; normal  Thought Process:  Coherent and Relevant  Though Content:  WNL; not suicidal and not homicidal  Orientation:   person, place, time/date and situation  Judgment:   Fair  Planning:   Fair  Affect:    Depressed and Lethargic  Mood:    Depressed and Dysphoric  Insight:   Fair  Intelligence:   normal   Medical History:   Past Medical History:  Diagnosis Date  . Abdominal muscle pain 09/08/2016  . Abnormal Pap smear of cervix 2009  . Abscess    history of multiple abscesses  . Acute bilateral low back pain 02/13/2017  . Acute blood loss anemia   . Anemia of chronic disease 2002  . Anxiety    Panic attacks  . Bilateral lower extremity edema 05/13/2016  . Bipolar disorder ([place])   . Cellulitis 05/21/2014   right eye  . Chronic bronchitis ([place])    "get it q yr" (05/13/2013)  . Chronic pain   . Depression   . Edema of lower extremity   . Endocarditis 2002   subacute bacterial endocarditis.   . Family history of anesthesia complication    "my mom has a hard time coming out from under"  . Fibromyalgia   . GERD (gastroesophageal reflux disease)    occ  . Heart murmur   . History of blood transfusion    "just low blood count" (05/13/2013)  . Hyperlipidemia   . Hypertension   . Hypothyroidism   . Hypothyroidism, adult 03/21/2014  . Leukocytosis   . Necrosis ([place])    and ulceration  . Obesity   . OSA on CPAP    does not wear CPAP  . Peripheral neuropathy   . Type II diabetes mellitus ([facility])    Type  II        Abuse/Trauma History: The patient reports that the death of her mother 1 year ago has been a major stressor for her and she has struggled to cope with  her loss.  Psychiatric History:  The patient has a prior history of diagnosis of bipolar disorder and/or depression/anxiety.  It was not clear today whether the primary psychiatric diagnosis is bipolar disorder or anxiety and depression.  Family Med/Psych History:  Family History  Problem Relation Age of Onset  . Heart failure Mother   . Diabetes Mother   . Kidney disease Mother   . Kidney disease Father   . Diabetes Father   . Diabetes Paternal Grandmother   . Heart failure Paternal Grandmother   . Other Other     Risk of Suicide/Violence: low the patient denies suicidal or homicidal ideation although she does acknowledge her depression and coping difficulties over the past year have likely played a role in her not taking good care of herself and resulting in the degree of illness that she is developed.  Impression/DX:  [name] [name] is a 42 year old female with a history of type 2 diabetes,  OSA, chronic kidney disease, bipolar disorder/depression, morbid obesity, and recent left below the knee amputation.  The patient has had recent falls and increased edema and pain in her left arm.  She was admitted on 04/03/2018 with fever due to sepsis from group B bacteremia.  MRI of the left lower extremity done showing generalized soft tissue edema and distal thigh with severe muscle edema and residual gastrocnemius with question of infection versus inflammatory myositis.  The patient had extended illness with debility developed and significant medical complications secondary to her sepsis.  The patient also has a prior history of diagnosis of bipolar disorder/depression and anxiety.  The patient has had a number of recent psychosocial issues over the past year.  A close friend passed away 1 year ago, patient had her lower left leg amputated and then her mother passed away.  Significant financial difficulties developed after her mother passed away and the patient has been struggling with major  stressors over the past year.   The patient reports an describe significant depression and anxiety over the past year.  The patient reports that her current medical issues likely are related to this as the patient has become overwhelmed with depression and was not taking care of herself very well.  The patient is struggled after the death of her mother.  The patient reports that she only went in to the emergency room because of severe pain and then found out how sick she was.   Disposition/Plan:  We will follow-up with the patient next week as needed.  Diagnosis:    Debility, Depression        Electronically Signed   _______________________ Ilean Skill, Psy.D.

## 2018-04-18 NOTE — Progress Notes (Signed)
Orthopedic Tech Progress Note Patient Details:  [name] [name] March 02, 1976 [id number]  Patient ID: [name], female   DOB: [birth year], 42 y.o.   MRN: [id number] Called in order to hanger.  Karolee Stamps 04/18/2018, 9:19 AM

## 2018-04-18 NOTE — Progress Notes (Signed)
Hypoglycemic Event  CBG: 59  Treatment: 15 GM carbohydrate snack  Symptoms: Shaky  Follow-up CBG: Time:0715 CBG Result: 75  Possible Reasons for Event: Medication regimen: Lantus  Comments/MD notified:Dr. Posey Pronto made aware. No new orders.    [name] [name]

## 2018-04-18 NOTE — Progress Notes (Signed)
Social Work Patient ID: [name] [name], female   DOB: 09-Jan-1976, 42 y.o.   MRN: [id number]   Have reviewed team conference with pt.     Pt aware and agreeable with targeted d/c date of 12/10 and goals of supervision overall.  Pt aware that team recommending ramp for home entry and will be talking with landlord about this.  Feels she is doing better overall.  Prosthesis is being adjusted.  Continue to follow.  Larron Armor, LCSW

## 2018-04-18 NOTE — Progress Notes (Addendum)
Physical Therapy Session Note  Patient Details  Name: [name] [name] MRN: [id number] Date of Birth: 02-04-1976  Today's Date: 04/18/2018 PT Individual Time: 0830-0930 PT Individual Time Calculation (min): 60 min   Short Term Goals: Week 2:  PT Short Term Goal 1 (Week 2): pt will ambulate x 10' with prosthesis and min A for bathroom mobility at home PT Short Term Goal 2 (Week 2): pt will perform stand pivot transfers wtih min A  Skilled Therapeutic Interventions/Progress Updates:  Pt sitting up in w/c, receiving meds.  She stated that she needed to urinate. Scoot transfer to L to [facility] over toilet (R armrest of BSC does not lower) with min assist for LLE .  Pt continent of B and B.  +2 for hygiene.  Pt donned L prosthesis while sitting on BSC, with min assist after set up.  Squat pivot to R to return to w/c.  Pt did oral, hand and face hygiene from w/c level after set up.  Pt wanted to immediately remove L prosthesis; PT explained that  prosthesis liner will help with edema better than ACEs, which were painful yesterday.  W/c propulsion using bil UEs on level tile, x 100', supervision.  Strengthening RLE seated in w/c, using KInetron at level 40 cm/sec, x 20 cycles focusing on gluteal muscles, x 20 cycles focusing on quqdreceps muscles.    Pt left resting in w/c with needs at hand, with prosthesis doffed but sleeve left on and LLE elevated on amputee pad.     Therapy Documentation Precautions:  Precautions Precautions: Fall Restrictions Weight Bearing Restrictions: Yes RLE Weight Bearing: Weight bearing as tolerated LLE Weight Bearing: Non weight bearing(L BKA)  Pain: 9/10 L knee; premedicated and re-positioned.         Therapy/Group: Individual Therapy  Melani Brisbane 04/18/2018, 10:16 AM

## 2018-04-18 NOTE — Progress Notes (Signed)
Pt blood sugar was 59 this morning. Pt received Lantus with bedtime snack. Pt states she feels shaky. Pt received 15 grams of carbs. Pt recheck blood glucose was 75. Pt states she no longer feels shaky. Dr. Posey Pronto on floor and was notified. No new orders.

## 2018-04-18 NOTE — Progress Notes (Signed)
[place] PHYSICAL MEDICINE & REHABILITATION PROGRESS NOTE   Subjective/Complaints: Patient seen sitting up in her chair this morning.  She states she slept fairly overnight.  She notes cramping pain in her left thigh.  Also notified by nursing regarding symptomatic hypoglycemia overnight  ROS: Left thigh pain.  Denies CP, SOB, N/V/D  Objective:   No results found. Recent Labs    04/17/18 0803 04/18/18 0447  WBC 5.0 5.5  HGB 7.2* 8.4*  HCT 25.4* 28.1*  PLT 346 304   Recent Labs    04/16/18 0416 04/17/18 0414  NA 137 135  K 5.2* 5.2*  CL 107 105  CO2 27 27  GLUCOSE 108* 107*  BUN 17 18  CREATININE 0.92 0.98  CALCIUM 8.4* 8.3*    Intake/Output Summary (Last 24 hours) at 04/18/2018 0920 Last data filed at 04/18/2018 0825 Gross per 24 hour  Intake 1550 ml  Output 2400 ml  Net -850 ml     Physical Exam: Vital Signs Blood pressure (!) 142/78, pulse 100, temperature 98.5 F (36.9 C), temperature source Oral, resp. rate 19, height 5\' 10"  (1.778 m), weight (!) 154.9 kg, SpO2 95 %. Constitutional: No distress . Vital signs reviewed. HENT: Normocephalic.  Atraumatic. Eyes: EOMI. No discharge. Cardiovascular: RRR.  No JVD. Respiratory: CTA bilaterally.  Normal effort. GI: BS +. Non-distended. Musc: No edema or tenderness in extremities. Musculoskeletal: She exhibitsedema and tenderness, unchanged. Left leg tender, scar along incision.  Tender to palpation.    Right foot with chronic wound Neurological: She alert and oriented  Motor: B/l UE 5/5, stable.  LLE: HF 4-/5, limited by pain, unchanged.  RLE: 4/5 prox to distal with pain inhibition.  Skin: She isnot diaphoretic.  Left stump with dark skin proximal and distal to the incision site Psychiatric:Normal mood.  Normal behavior.   Assessment/Plan: 1. Functional deficits secondary to LLE myositis which require 3+ hours per day of interdisciplinary therapy in a comprehensive inpatient rehab  setting.  Physiatrist is providing close team supervision and 24 hour management of active medical problems listed below.  Physiatrist and rehab team continue to assess barriers to discharge/monitor patient progress toward functional and medical goals  Care Tool:  Bathing    Body parts bathed by patient: Left arm, Right arm, Chest, Abdomen, Front perineal area, Face, Right upper leg, Left upper leg   Body parts bathed by helper: Right lower leg Body parts n/a: Left lower leg, Buttocks   Bathing assist Assist Level: Minimal Assistance - Patient > 75%     Upper Body Dressing/Undressing Upper body dressing   What is the patient wearing?: Hospital gown only    Upper body assist Assist Level: Supervision/Verbal cueing    Lower Body Dressing/Undressing Lower body dressing      What is the patient wearing?: Incontinence brief     Lower body assist Assist for lower body dressing: Maximal Assistance - Patient 25 - 49%     Toileting Toileting    Toileting assist Assist for toileting: Total Assistance - Patient < 25%     Transfers Chair/bed transfer  Transfers assist     Chair/bed transfer assist level: Minimal Assistance - Patient > 75%     Locomotion Ambulation   Ambulation assist   Ambulation activity did not occur: Safety/medical concerns          Walk 10 feet activity   Assist           Walk 50 feet activity   Assist  Walk 150 feet activity   Assist           Walk 10 feet on uneven surface  activity   Assist           Wheelchair     Assist Will patient use wheelchair at discharge?: Yes Type of Wheelchair: Manual    Wheelchair assist level: Supervision/Verbal cueing Max wheelchair distance: 150 ft    Wheelchair 50 feet with 2 turns activity    Assist    Wheelchair 50 feet with 2 turns activity did not occur: Safety/medical concerns(Per report )   Assist Level: Supervision/Verbal cueing    Wheelchair 150 feet activity     Assist Wheelchair 150 feet activity did not occur: Safety/medical concerns(Per report )   Assist Level: Supervision/Verbal cueing     Medical Problem List and Plan: 1.Functional and mobility deficitssecondary to left lower extremity myositis/strep bacteremia -continue with CIR 2. DVT Prophylaxis/Anticoagulation: Pharmaceutical:Lovenox 3.Chronic back pain/Management: Continue oxycodone as needed, increased to 2 tablets on 11/27  Robaxin 1000 mg 4 times a day scheduled on 11/27  Trial of naproxen on 11/27 4. Mood:LCSW to follow for evaluation and support. Egosupport to be provided by team 5. Neuropsych: This patientiscapable of making decisions on herown behalf. 6. Skin/Wound Care:Routine pressure relief measures.    -pt aware of shifting, boosting 7. Fluids/Electrolytes/Nutrition:Monitor I's and O's.  -encourage PO intake 8.Group B strep agalactiaebacteremia: Continue IV Ceftriaxone with end date 11/28  WBCs 5.0 on 11/26 9.T2DM with neuropathy: We will monitor blood sugars AC at bedtime.     Lantus changed to 5 units twice daily on 11/28  Hypoglycemia on 11/27 10.Chronic constipation: BM 11/19.  Will continue to augment back bowel program 11.HTN: Monitor blood pressures twice daily.   Bumex restarted  Slightly elevated on 11/27 12.Peripheral edema: Monitor weights daily. -compression/elevation of LE's 13.Bipolar disorder with depression: Continue Prozac 14.Acute on?  Chronic renal failure:   Creatinine 0.98 on 11/26  Continue to monitor 15.Anemia of chronic disease/iron deficiency:   - stool guaiac negative.    - iron supplement.   Hemoglobin 8.4 on 11/27 after unit transfusion on 11/26  Continue to monitor 16. Morbid obesiy-BMI-48.6: Educate patient on appropriate diet and importance of activity to help promote health and mobility. 17.  Hyperkalemia  Potassium 5.2 on  11/26  LOS: 8 days A FACE TO FACE EVALUATION WAS PERFORMED  Ankit Lorie Phenix 04/18/2018, 9:20 AM

## 2018-04-18 NOTE — Progress Notes (Signed)
Occupational Therapy Session Note  Patient Details  Name: [name] [name] MRN: [id number] Date of Birth: 11/06/1975  Today's Date: 04/18/2018  Session 1 OT Individual Time: 1030-1200 OT Individual Time Calculation (min): 90 min   Session 2 OT Individual Time: 1302-1400 OT Individual Time Calculation (min): 58 min   Short Term Goals: Week 2:  OT Short Term Goal 1 (Week 2): Pt will complete squat pivot to w/c with max +1 OT Short Term Goal 2 (Week 2): Pt will don pants with mod A +1 OT Short Term Goal 3 (Week 2): Pt will complete sit<>stand with RW with Max A +1 in preparation for BADL tasks OT Short Term Goal 4 (Week 2): Pt will complete toileting with min A  Skilled Therapeutic Interventions/Progress Updates:    Session 1 Pt greeted in wc and agreeable to OT treatment session. Pt requests to shower today. Changed out drop arm BSC for functional one. Pt completed stand-pivot transfer into shower onto commode seat with mod A to power up, then was able to pivot with min A with heavy use of grab bars. Provided pt with LH sponge to increase access to wash LB and back. Increased time and leaning method to was bottom, also used hole in [facility] to increase access to peri-area 2/2 body habitus. Stand-pivot out of shower in similar way with increased time and min A. Ensured L residual limb was dry, then donned prosthetic sleeve for edema management. Worked on LB dressing with pt able to thread B LEs into pants, OT assist with non-skid sock on R LE. 3 trials to achieve sit<>stand from wc using bariatric RW, but overall min/mod A to power up. Pt able to remove R UE for brief period to assist with pulling up pants, but OT assist to get them completely up. Set-up for UB dressing. Grooming completed wc at the sink, then pt brought to dayroom for lunch. Pt left with needs met.   Session 2 Pt greeted sitting in wc after having lunch meeting in the dayroom. Worked on sit<>stand, standing balance, and  endurance with wii bowling activity. Practiced wii bowling in sitting first, then pt completed sit<>stand with heavy duty RW with min A. Pt tolerated standing for 3, 5 minute intervals while removing R UE from RW to swing through. Pt with good standing balance requiring CGA throughout. Pt needed extended rest breaks in between standing. Pt returned to room and worked on LB dressing to remove pants for new prosthesis fitting with [employer] from [employer]. Sit<>stand with min A and RW, pt able to remove unilateral UE to assist with pulling down pants, OT assist to complete. Assisted with LLE positioning during molding and measuring for new prosthesis. Pt left seated in wc at end of session with call bell in reach and needs met.   Therapy Documentation Precautions:  Precautions Precautions: Fall Restrictions Weight Bearing Restrictions: No RLE Weight Bearing: Weight bearing as tolerated LLE Weight Bearing: Non weight bearing(L BKA) Pain: Pain Assessment Pain Scale: 0-10 Pain Score: 5  Pain Type: Acute pain;Surgical pain Pain Location: Knee Pain Orientation: Left Pain Descriptors / Indicators: Aching;Cramping Pain Frequency: Constant Pain Onset: On-going Patients Stated Pain Goal: 4 Pain Intervention(s): Repositioned   Therapy/Group: Individual Therapy  Valma Cava 04/18/2018, 2:04 PM

## 2018-04-19 LAB — GLUCOSE, CAPILLARY
GLUCOSE-CAPILLARY: 72 mg/dL (ref 70–99)
GLUCOSE-CAPILLARY: 87 mg/dL (ref 70–99)
Glucose-Capillary: 88 mg/dL (ref 70–99)
Glucose-Capillary: 106 mg/dL — ABNORMAL HIGH (ref 70–99)

## 2018-04-19 NOTE — Progress Notes (Signed)
[place] PHYSICAL MEDICINE & REHABILITATION PROGRESS NOTE   Subjective/Complaints: Patient seen sitting up this morning.  She states she slept well overnight.  She notes significant improvement in pain.  Spoke to prosthetists yesterday regarding left lower extremity prosthesis.  ROS: Denies CP, SOB, N/V/D  Objective:   No results found. Recent Labs    04/17/18 0803 04/18/18 0447  WBC 5.0 5.5  HGB 7.2* 8.4*  HCT 25.4* 28.1*  PLT 346 304   Recent Labs    04/17/18 0414  NA 135  K 5.2*  CL 105  CO2 27  GLUCOSE 107*  BUN 18  CREATININE 0.98  CALCIUM 8.3*    Intake/Output Summary (Last 24 hours) at 04/19/2018 0836 Last data filed at 04/18/2018 1700 Gross per 24 hour  Intake 700 ml  Output 250 ml  Net 450 ml     Physical Exam: Vital Signs Blood pressure 131/84, pulse 81, temperature 98.7 F (37.1 C), temperature source Oral, resp. rate 17, height 5\' 10"  (1.778 m), weight (!) 154.9 kg, SpO2 99 %. Constitutional: No distress . Vital signs reviewed. HENT: Normocephalic.  Atraumatic. Eyes: EOMI. No discharge. Cardiovascular: RRR.  No JVD. Respiratory: CTA bilaterally.  Normal effort. GI: BS +. Non-distended. Musculoskeletal: She exhibitsedema and tenderness, stable. Left leg tender, scar along incision.  Tender to palpation.    Right foot with chronic wound Neurological: She alert and oriented  Motor:   LLE: HF 4/5, limited by pain, .  RLE: 4/5 prox to distal with pain inhibition.  Skin: She isnot diaphoretic.  Left stump with dark skin proximal and distal to the incision site Psychiatric:Normal mood.  Normal behavior.   Assessment/Plan: 1. Functional deficits secondary to LLE myositis which require 3+ hours per day of interdisciplinary therapy in a comprehensive inpatient rehab setting.  Physiatrist is providing close team supervision and 24 hour management of active medical problems listed below.  Physiatrist and rehab team continue to assess barriers  to discharge/monitor patient progress toward functional and medical goals  Care Tool:  Bathing    Body parts bathed by patient: Left arm, Right arm, Chest, Abdomen, Front perineal area, Face, Right upper leg, Left upper leg, Right lower leg, Buttocks   Body parts bathed by helper: Right lower leg Body parts n/a: Left lower leg   Bathing assist Assist Level: Contact Guard/Touching assist     Upper Body Dressing/Undressing Upper body dressing   What is the patient wearing?: Pull over shirt    Upper body assist Assist Level: Set up assist    Lower Body Dressing/Undressing Lower body dressing      What is the patient wearing?: Pants, Ace wrap/stump shrinker     Lower body assist Assist for lower body dressing: Moderate Assistance - Patient 50 - 74%     Toileting Toileting    Toileting assist Assist for toileting: Moderate Assistance - Patient 50 - 74%     Transfers Chair/bed transfer  Transfers assist     Chair/bed transfer assist level: Minimal Assistance - Patient > 75%     Locomotion Ambulation   Ambulation assist   Ambulation activity did not occur: Safety/medical concerns          Walk 10 feet activity   Assist           Walk 50 feet activity   Assist           Walk 150 feet activity   Assist           Walk  10 feet on uneven surface  activity   Assist           Wheelchair     Assist Will patient use wheelchair at discharge?: Yes Type of Wheelchair: Manual    Wheelchair assist level: Supervision/Verbal cueing Max wheelchair distance: 100    Wheelchair 50 feet with 2 turns activity    Assist    Wheelchair 50 feet with 2 turns activity did not occur: Safety/medical concerns(Per report )   Assist Level: Supervision/Verbal cueing   Wheelchair 150 feet activity     Assist Wheelchair 150 feet activity did not occur: Safety/medical concerns(Per report )   Assist Level: Supervision/Verbal cueing      Medical Problem List and Plan: 1.Functional and mobility deficitssecondary to left lower extremity myositis/strep bacteremia -continue with CIR 2. DVT Prophylaxis/Anticoagulation: Pharmaceutical:Lovenox 3.Chronic back pain/Management: Continue oxycodone as needed, increased to 2 tablets on 11/27  Robaxin 1000 mg 4 times a day scheduled on 11/27  Trial of naproxen on 11/27, will consider DC naproxen tomorrow  Improving 4. Mood:LCSW to follow for evaluation and support. Egosupport to be provided by team 5. Neuropsych: This patientiscapable of making decisions on herown behalf. 6. Skin/Wound Care:Routine pressure relief measures.    -pt aware of shifting, boosting 7. Fluids/Electrolytes/Nutrition:Monitor I's and O's.  -encourage PO intake 8.Group B strep agalactiaebacteremia: Continue IV Ceftriaxone with end date today 9.T2DM with neuropathy: We will monitor blood sugars AC at bedtime.     Lantus changed to 5 units twice daily on 11/28  Labile on 11/28 10.Chronic constipation: BM 11/19.  Will continue to augment back bowel program 11.HTN: Monitor blood pressures twice daily.   Bumex restarted  Controlled on 11/28 12.Peripheral edema: Monitor weights daily. -compression/elevation of LE's 13.Bipolar disorder with depression: Continue Prozac 14.Acute on?  Chronic renal failure:   Creatinine 0.98 on 11/26  Labs ordered for tomorrow  Continue to monitor 15.Anemia of chronic disease/iron deficiency:   - stool guaiac negative.    - iron supplement.   Hemoglobin 8.4 on 11/27 after unit transfusion on 11/26  Labs ordered for tomorrow  Continue to monitor 16. Morbid obesiy-BMI-48.6: Educate patient on appropriate diet and importance of activity to help promote health and mobility. 17.  Hyperkalemia  Potassium 5.2 on 11/26  Labs ordered for tomorrow  LOS: 9 days A FACE TO FACE EVALUATION WAS PERFORMED   Lorie Phenix 04/19/2018, 8:36 AM

## 2018-04-20 ENCOUNTER — Inpatient Hospital Stay (HOSPITAL_COMMUNITY): Payer: Self-pay | Admitting: Physical Therapy

## 2018-04-20 ENCOUNTER — Inpatient Hospital Stay (HOSPITAL_COMMUNITY): Payer: Self-pay | Admitting: Occupational Therapy

## 2018-04-20 DIAGNOSIS — N179 Acute kidney failure, unspecified: Secondary | ICD-10-CM

## 2018-04-20 LAB — CBC WITH DIFFERENTIAL/PLATELET
ABS IMMATURE GRANULOCYTES: 0.01 10*3/uL (ref 0.00–0.07)
Basophils Absolute: 0 10*3/uL (ref 0.0–0.1)
Basophils Relative: 1 %
Eosinophils Absolute: 0.1 10*3/uL (ref 0.0–0.5)
Eosinophils Relative: 3 %
HCT: 25.2 % — ABNORMAL LOW (ref 36.0–46.0)
Hemoglobin: 7.4 g/dL — ABNORMAL LOW (ref 12.0–15.0)
IMMATURE GRANULOCYTES: 0 %
LYMPHS PCT: 36 %
Lymphs Abs: 1.9 10*3/uL (ref 0.7–4.0)
MCH: 24.8 pg — AB (ref 26.0–34.0)
MCHC: 29.4 g/dL — ABNORMAL LOW (ref 30.0–36.0)
MCV: 84.6 fL (ref 80.0–100.0)
MONO ABS: 0.4 10*3/uL (ref 0.1–1.0)
MONOS PCT: 9 %
NEUTROS ABS: 2.7 10*3/uL (ref 1.7–7.7)
Neutrophils Relative %: 51 %
Platelets: 327 10*3/uL (ref 150–400)
RBC: 2.98 MIL/uL — AB (ref 3.87–5.11)
RDW: 15 % (ref 11.5–15.5)
WBC: 5.2 10*3/uL (ref 4.0–10.5)
nRBC: 0 % (ref 0.0–0.2)

## 2018-04-20 LAB — BASIC METABOLIC PANEL
Anion gap: 4 — ABNORMAL LOW (ref 5–15)
BUN: 27 mg/dL — AB (ref 6–20)
CHLORIDE: 103 mmol/L (ref 98–111)
CO2: 27 mmol/L (ref 22–32)
Calcium: 8.4 mg/dL — ABNORMAL LOW (ref 8.9–10.3)
Creatinine, Ser: 1.39 mg/dL — ABNORMAL HIGH (ref 0.44–1.00)
GFR calc Af Amer: 54 mL/min — ABNORMAL LOW (ref >60)
GFR calc non Af Amer: 47 mL/min — ABNORMAL LOW (ref >60)
GLUCOSE: 82 mg/dL (ref 70–99)
POTASSIUM: 4.9 mmol/L (ref 3.5–5.1)
Sodium: 134 mmol/L — ABNORMAL LOW (ref 135–145)

## 2018-04-20 LAB — GLUCOSE, CAPILLARY
GLUCOSE-CAPILLARY: 66 mg/dL — AB (ref 70–99)
GLUCOSE-CAPILLARY: 125 mg/dL — AB (ref 70–99)
Glucose-Capillary: 72 mg/dL (ref 70–99)
Glucose-Capillary: 84 mg/dL (ref 70–99)
Glucose-Capillary: 105 mg/dL — ABNORMAL HIGH (ref 70–99)

## 2018-04-20 LAB — OCCULT BLOOD X 1 CARD TO LAB, STOOL: Fecal Occult Bld: NEGATIVE

## 2018-04-20 NOTE — Progress Notes (Signed)
[place] PHYSICAL MEDICINE & REHABILITATION PROGRESS NOTE   Subjective/Complaints: Patient seen sitting up in bed this AM.  She slept well overnight.  She is ready to resume therapies. Noted to be hypoglycemia this AM.    ROS: Denies CP, SOB, N/V/D  Objective:   No results found. Recent Labs    04/18/18 0447 04/20/18 0317  WBC 5.5 5.2  HGB 8.4* 7.4*  HCT 28.1* 25.2*  PLT 304 327   Recent Labs    04/20/18 0317  NA 134*  K 4.9  CL 103  CO2 27  GLUCOSE 82  BUN 27*  CREATININE 1.39*  CALCIUM 8.4*    Intake/Output Summary (Last 24 hours) at 04/20/2018 0749 Last data filed at 04/19/2018 1716 Gross per 24 hour  Intake 960 ml  Output 800 ml  Net 160 ml     Physical Exam: Vital Signs Blood pressure 138/82, pulse 98, temperature 98.5 F (36.9 C), temperature source Oral, resp. rate 19, height 5\' 10"  (1.778 m), weight (!) 154.9 kg, SpO2 94 %. Constitutional: No distress . Vital signs reviewed. HENT: Normocephalic.  Atraumatic. Eyes: EOMI. No discharge. Cardiovascular: RRR. No JVD. Respiratory: CTA bilaterally.  Normal effort. GI: BS +. Non-distended. Musculoskeletal: She exhibitsedema and tenderness, unchanged. Left leg tender, scar along incision.  Tender to palpation.    Neurological: She alert and oriented  Motor:   LLE: HF 4/5, limited by pain, unchanged.  RLE: 4+/5 prox to distal with pain inhibition.  Skin: She isnot diaphoretic.  Left stump with dark skin proximal and distal to the incision site Right foot with chronic wound, not examined today Psychiatric:Normal mood.  Normal behavior.   Assessment/Plan: 1. Functional deficits secondary to LLE myositis which require 3+ hours per day of interdisciplinary therapy in a comprehensive inpatient rehab setting.  Physiatrist is providing close team supervision and 24 hour management of active medical problems listed below.  Physiatrist and rehab team continue to assess barriers to discharge/monitor  patient progress toward functional and medical goals  Care Tool:  Bathing    Body parts bathed by patient: Left arm, Right arm, Chest, Abdomen, Front perineal area, Face, Right upper leg, Left upper leg, Right lower leg, Buttocks   Body parts bathed by helper: Right lower leg Body parts n/a: Left lower leg   Bathing assist Assist Level: Contact Guard/Touching assist     Upper Body Dressing/Undressing Upper body dressing   What is the patient wearing?: Pull over shirt    Upper body assist Assist Level: Set up assist    Lower Body Dressing/Undressing Lower body dressing      What is the patient wearing?: Pants, Ace wrap/stump shrinker     Lower body assist Assist for lower body dressing: Moderate Assistance - Patient 50 - 74%     Toileting Toileting    Toileting assist Assist for toileting: Moderate Assistance - Patient 50 - 74%     Transfers Chair/bed transfer  Transfers assist     Chair/bed transfer assist level: Minimal Assistance - Patient > 75%     Locomotion Ambulation   Ambulation assist   Ambulation activity did not occur: Safety/medical concerns          Walk 10 feet activity   Assist           Walk 50 feet activity   Assist           Walk 150 feet activity   Assist           Walk  10 feet on uneven surface  activity   Assist           Wheelchair     Assist Will patient use wheelchair at discharge?: Yes Type of Wheelchair: Manual    Wheelchair assist level: Supervision/Verbal cueing Max wheelchair distance: 100    Wheelchair 50 feet with 2 turns activity    Assist    Wheelchair 50 feet with 2 turns activity did not occur: Safety/medical concerns(Per report )   Assist Level: Supervision/Verbal cueing   Wheelchair 150 feet activity     Assist Wheelchair 150 feet activity did not occur: Safety/medical concerns(Per report )   Assist Level: Supervision/Verbal cueing     Medical Problem  List and Plan: 1.Functional and mobility deficitssecondary to left lower extremity myositis/strep bacteremia -continue with CIR 2. DVT Prophylaxis/Anticoagulation: Pharmaceutical:Lovenox 3.Chronic back pain/Management: Continue oxycodone as needed, increased to 2 tablets on 11/27  Robaxin 1000 mg 4 times a day scheduled on 11/27  Naproxen on 11/27, d/ced on 11/29  Improving 4. Mood:LCSW to follow for evaluation and support. Egosupport to be provided by team 5. Neuropsych: This patientiscapable of making decisions on herown behalf. 6. Skin/Wound Care:Routine pressure relief measures.    -pt aware of shifting, boosting 7. Fluids/Electrolytes/Nutrition:Monitor I's and O's.  -encourage PO intake 8.Group B strep agalactiaebacteremia: Continue IV Ceftriaxone completed on 11/28 9.T2DM with neuropathy: We will monitor blood sugars AC at bedtime.     Lantus changed to 5 units twice daily on 11/28 evening  Hypoglycemia on 11/29 10.Chronic constipation: BM 11/19.  Will continue to augment back bowel program 11.HTN: Monitor blood pressures twice daily.   Bumex restarted  Labile on 11/29 12.Peripheral edema: Monitor weights daily. -compression/elevation of LE's 13.Bipolar disorder with depression: Continue Prozac 14.Acute on?  Chronic renal failure:   Creatinine 1.39 on 11/29  Encourage fluids  Continue to monitor 15.Anemia of chronic disease/iron deficiency:   iron supplement.   Hemoglobin 7.4 on 11/29 after unit 2 transfusion on 11/26  Labs ordered for tomorrow  Hemocult ordered  Continue to monitor 16. Morbid obesiy-BMI-48.6: Educate patient on appropriate diet and importance of activity to help promote health and mobility. 17.  Hyperkalemia  Potassium 4.9 on 11/29  LOS: 10 days A FACE TO FACE EVALUATION WAS PERFORMED  Lajean Boese Lorie Phenix 04/20/2018, 7:49 AM

## 2018-04-20 NOTE — Progress Notes (Signed)
Occupational Therapy Session Note  Patient Details  Name: [name] [name] MRN: [id number] Date of Birth: [birth year]  Today's Date: 04/20/2018  Session 1 OT Individual Time: 1505-6979 OT Individual Time Calculation (min): 40 min   Session 2 OT Individual Time: 1002-1130 OT Individual Time Calculation (min): 88 min    Short Term Goals: Week 2:  OT Short Term Goal 1 (Week 2): Pt will complete squat pivot to w/c with max +1 OT Short Term Goal 2 (Week 2): Pt will don pants with mod A +1 OT Short Term Goal 3 (Week 2): Pt will complete sit<>stand with RW with Max A +1 in preparation for BADL tasks OT Short Term Goal 4 (Week 2): Pt will complete toileting with min A  Skilled Therapeutic Interventions/Progress Updates:    Session 1 Pt greeted semi-reclined in bed, agreeable to OT, but tearful this morning. Pt had mentioned to OT in previous session that her mother passed away last year around Thanksgiving, and this is likely why she was sad this morning. Pt declined to talk about it with therapist but OT used therapeutic use of self to comfort pt. Sit<>stand at EOB with heavy duty RW and min A. Practiced moving up and down EOB in standing by swiveling foot and managing RW, pt demonstrated improved strength and balance with activity. Took seated rest break, then completed stand-pivot to wc with min A! Pt completed grooming tasks seated in wc at the sink.  Session 2 Pt greeted sitting in wc and agreeable to OT. Pt feels better and in better spirits this session. Pt needed assistance to maneuver wc into bathroom 2/2 limited space. Pt completed stand-pivot into shower onto wide drop arm commode with Min A and heavy use of grab bars. Bathing completed using LH sponge and leaning method to wash buttocks. Pt request to try to stand to ensure bottom was clean. Sit<>stand with grab bars and min A. CGA to maintain balance when reaching behind to wash buttocks. Stand-pivot out of shower with min A.  Dressing completed wc at the sink with min A overall, sit<>stand min A with RW and assistance to pull pants over hips. B UE strength with wc propulsion to dayroom. 10 Mins on [employer] arm bike level 5. Pt returned to room and left seated in wc with needs met.   Therapy Documentation Precautions:  Precautions Precautions: Fall Restrictions Weight Bearing Restrictions: Yes RLE Weight Bearing: Weight bearing as tolerated LLE Weight Bearing: Non weight bearing Pain: Pain Assessment Pain Scale: 0-10 Pain Score: 5  Pain Type: Surgical pain Pain Location: Leg Pain Orientation: Left Pain Descriptors / Indicators: Aching Pain Frequency: Constant Pain Onset: On-going Patients Stated Pain Goal: 6 Pain Intervention(s): Repositioned  Therapy/Group: Individual Therapy  Valma Cava 04/20/2018, 11:34 AM

## 2018-04-20 NOTE — Progress Notes (Signed)
Hypoglycemic Event  CBG: 66  Treatment: 15 GM carbohydrate snack  Symptoms: None  Follow-up CBG: Time:647 CBG Result:72  Possible Reasons for Event: Unknown  Comments/MD notified: sticky note left for [name] Homes MD    [name] [name]

## 2018-04-20 NOTE — Progress Notes (Signed)
Physical Therapy Session Note  Patient Details  Name: [name] [name] MRN: [id number] Date of Birth: [birth year]  Today's Date: 04/20/2018 PT Individual Time: 0830-0925 PT Individual Time Calculation (min): 55 min   Short Term Goals: Week 2:  PT Short Term Goal 1 (Week 2): pt will ambulate x 10' with prosthesis and min A for bathroom mobility at home PT Short Term Goal 2 (Week 2): pt will perform stand pivot transfers wtih min A  Skilled Therapeutic Interventions/Progress Updates:    pt in room. C/o w/c being "too big".  PT switched pt to smaller w/c with pt with improvement in comfort and posture.  Pt propels w/c 100' x 2 with supervision.  Sit <> stands and standing tolerance for LE strength and endurance and balance training. Pt able to sit <> stand with CGA. Standing balance with supervision for Lt LE therex and for standing horseshoe game. Pt with good balance on Rt LE and improved standing tolerance to 5 minutes before requiring seated rest.  UE therex in sitting with 5# dowel for 2 x 12 shoulder and elbow strengthening exercises. Pt left in room with needs at hand.  Therapy Documentation Precautions:  Precautions Precautions: Fall Restrictions Weight Bearing Restrictions: Yes RLE Weight Bearing: Weight bearing as tolerated LLE Weight Bearing: Non weight bearing Pain:  pt c/o back and residual limb pain throughout session, meds given prior to session, rest and repositioning as needed   Therapy/Group: Individual Therapy  Brytney Somes 04/20/2018, 9:26 AM

## 2018-04-21 ENCOUNTER — Inpatient Hospital Stay (HOSPITAL_COMMUNITY): Payer: Self-pay | Admitting: Physical Therapy

## 2018-04-21 ENCOUNTER — Inpatient Hospital Stay (HOSPITAL_COMMUNITY): Payer: Self-pay

## 2018-04-21 ENCOUNTER — Inpatient Hospital Stay (HOSPITAL_COMMUNITY): Payer: Self-pay | Admitting: Occupational Therapy

## 2018-04-21 DIAGNOSIS — N179 Acute kidney failure, unspecified: Secondary | ICD-10-CM

## 2018-04-21 LAB — CBC WITH DIFFERENTIAL/PLATELET
Abs Immature Granulocytes: 0.03 10*3/uL (ref 0.00–0.07)
Basophils Absolute: 0 10*3/uL (ref 0.0–0.1)
Basophils Relative: 1 %
Eosinophils Absolute: 0.1 10*3/uL (ref 0.0–0.5)
Eosinophils Relative: 2 %
HCT: 25.2 % — ABNORMAL LOW (ref 36.0–46.0)
Hemoglobin: 7.2 g/dL — ABNORMAL LOW (ref 12.0–15.0)
Immature Granulocytes: 1 %
Lymphocytes Relative: 34 %
Lymphs Abs: 1.9 10*3/uL (ref 0.7–4.0)
MCH: 24.2 pg — AB (ref 26.0–34.0)
MCHC: 28.6 g/dL — ABNORMAL LOW (ref 30.0–36.0)
MCV: 84.8 fL (ref 80.0–100.0)
Monocytes Absolute: 0.4 10*3/uL (ref 0.1–1.0)
Monocytes Relative: 8 %
NEUTROS ABS: 3.2 10*3/uL (ref 1.7–7.7)
Neutrophils Relative %: 54 %
Platelets: 304 10*3/uL (ref 150–400)
RBC: 2.97 MIL/uL — ABNORMAL LOW (ref 3.87–5.11)
RDW: 15 % (ref 11.5–15.5)
WBC: 5.7 10*3/uL (ref 4.0–10.5)
nRBC: 0 % (ref 0.0–0.2)

## 2018-04-21 LAB — GLUCOSE, CAPILLARY
Glucose-Capillary: 106 mg/dL — ABNORMAL HIGH (ref 70–99)
Glucose-Capillary: 110 mg/dL — ABNORMAL HIGH (ref 70–99)
Glucose-Capillary: 100 mg/dL — ABNORMAL HIGH (ref 70–99)
Glucose-Capillary: 158 mg/dL — ABNORMAL HIGH (ref 70–99)

## 2018-04-21 NOTE — Progress Notes (Signed)
Subjective: She admits to some left chronic stump pain.  No other complaints. Objective: BP 122/78 (BP Location: Left Arm)   Pulse 88   Temp 98 F (36.7 C)   Resp 16   Ht 5\' 10"  (1.778 m)   Wt (!) 150.5 kg   SpO2 99% Comment: taken on ear  BMI 47.61 kg/m  Morbidly obese female in no acute distress.  HEENT exam atraumatic, normocephalic, extra ocular muscles are intact.  Neck is supple.  I am unable to appreciate any jugular venous distention.  I cannot feel any lymphadenopathy. Chest clear to auscultation Cardiac exam S1 and S2 are regular Abdominal exam obese, active bowel sounds, soft. Extremities she has tenderness around the stump scar on the left.  Tender to palpation.  Left lower extremity with edema as well.  There are changes consistent with chronic venous insufficiency.  Assessment and plan:  1.  Left lower extremity myositis with strep bacteremia.  Continue inpatient rehab. 2.  DVT prophylaxis with Lovenox 3.  Chronic back pain.  On multiple medications.  Will continue current therapy. 4.  Skin care continue current skin care regimen. 5.  Group B strep completed therapy on November 28. Type 2 diabetes CBG (last 3)  Recent Labs    04/20/18 1638 04/20/18 2139 04/21/18 0704  GLUCAP 105* 125* 106*  Adequately controlled. 7.  Hypertension reasonably well controlled. 8.  Bipolar disorder on therapy 9.  Anemia of chronic disease.  Will repeat hemoglobin tomorrow.

## 2018-04-21 NOTE — Progress Notes (Signed)
Occupational Therapy Session Note  Patient Details  Name: [name] [name] MRN: [id number] Date of Birth: 1975-10-07  Today's Date: 04/21/2018 OT Individual Time: 4098-1191 OT Individual Time Calculation (min): 69 min   Short Term Goals: Week 2:  OT Short Term Goal 1 (Week 2): Pt will complete squat pivot to w/c with max +1 OT Short Term Goal 2 (Week 2): Pt will don pants with mod A +1 OT Short Term Goal 3 (Week 2): Pt will complete sit<>stand with RW with Max A +1 in preparation for BADL tasks OT Short Term Goal 4 (Week 2): Pt will complete toileting with min A  Skilled Therapeutic Interventions/Progress Updates:    Pt greeted in bed with RN present to administer medication. Pt wanted to complete bathing during session. Supervision for transition to EOB and she proceeded with sponge bathing with overall supervision, utilizing lateral/posterior leans and LH sponge as needed with increased time. Min A required for powder application under skin folds. We also inspected residual limb as well as Rt foot. Discussed importance of daily skin inspection for preventive care at home. Also discussed recommendation for sponge bathing at d/c, as pt reports TTB cannot fit into tub at home. She plans to lower into tub to take a bath, as this was how she was bathing PTA with BKA. Advised to discuss shower safety with HHOT before attempting bathing in tub with PCA. Pt verbalized understanding. She donned hospital gown and required assist for donning Rt gripper sock. Stand pivot<w/c completed with steady assist using bariatric RW (OT adjusted height). Pt donned prosthetic liner with setup and was positioned at sink. Pt left at sink to complete oral care/grooming tasks, and reported that she was able to maneuver w/c back towards bed with ready.   Therapy Documentation Precautions:  Precautions Precautions: Fall Restrictions Weight Bearing Restrictions: Yes RLE Weight Bearing: Weight bearing as  tolerated LLE Weight Bearing: Non weight bearing Pain: In B LEs. RN provided pain medicine at start of session. Provided k-pad to residual limb at end of tx.  Pain Assessment Pain Scale: 0-10 Pain Score: 8  Pain Type: Acute pain Pain Location: Leg Pain Orientation: Left Pain Descriptors / Indicators: Aching Pain Onset: On-going Pain Intervention(s): Medication (See eMAR) ADL:       Therapy/Group: Individual Therapy  Yaacov Koziol A Odel Schmid 04/21/2018, 12:09 PM

## 2018-04-21 NOTE — Progress Notes (Signed)
Occupational Therapy Session Note  Patient Details  Name: [name] [name] MRN: [id number] Date of Birth: 1976/05/09  Today's Date: 04/21/2018 OT Individual Time: 1120-1200 OT Individual Time Calculation (min): 40 min    Short Term Goals: Week 1:  OT Short Term Goal 1 (Week 1): Pt will don pants with mod A +1 OT Short Term Goal 1 - Progress (Week 1): Progressing toward goal OT Short Term Goal 2 (Week 1): Pt will don shirt sitting EOB with (S) for improved sitting balance OT Short Term Goal 2 - Progress (Week 1): Met OT Short Term Goal 3 (Week 1): Pt will complete squat pivot to w/c with max +1 OT Short Term Goal 3 - Progress (Week 1): Progressing toward goal  Skilled Therapeutic Interventions/Progress Updates:    1:1. Pt received in w/c eating lunch, however willing to participate. Pt completes w/c propulsion to/from gym with supervision and VC for using RLE to A with efficient propulsion. Pt works on Ambulance person and balance through activities of standing at high low table to play jenga game statically and standing at [place] reaching iwht BUE across midline for horseshoes to throw at target with CGA. Exited session with pt seated in w/c and food/call light in reach   Therapy Documentation Precautions:  Precautions Precautions: Fall Restrictions Weight Bearing Restrictions: Yes RLE Weight Bearing: Weight bearing as tolerated LLE Weight Bearing: Non weight bearing General:   Vital Signs:   Pain: Pain Assessment Pain Scale: 0-10 Pain Score: 8  Pain Type: Acute pain Pain Location: Leg Pain Orientation: Left Pain Descriptors / Indicators: Aching Pain Onset: On-going Pain Intervention(s): Medication (See eMAR)   Therapy/Group: Individual Therapy  [name] 04/21/2018, 12:03 PM

## 2018-04-21 NOTE — Progress Notes (Signed)
Physical Therapy Session Note  Patient Details  Name: [name] [name] MRN: [id number] Date of Birth: January 08, 1976  Today's Date: 04/21/2018 PT Individual Time: 1445-1545 PT Individual Time Calculation (min): 60 min   Short Term Goals: Week 1:  PT Short Term Goal 1 (Week 1): pt will transfer with assistance of 1 person bed>< w/c PT Short Term Goal 1 - Progress (Week 1): Met PT Short Term Goal 2 (Week 1): pt will propel w/c x 100' over level tile with supervision PT Short Term Goal 2 - Progress (Week 1): Met PT Short Term Goal 3 (Week 1): pt will participate in consultation with prosthetist regarding L prosthesis PT Short Term Goal 3 - Progress (Week 1): Met  Skilled Therapeutic Interventions/Progress Updates:    Pt received seated in WC and agreeable to PT.  Self propelled WC 191f x2 with supervision assist. Verbal cues for [facility] Instit[facility] when navigating obstacles.  Kinetron with RLE x3 min, 2 sets; pt seated in WC (level 30 resistance settting). Verbal cues for increasing speed of exercise. PT pedaling opposite foot pedal.  Wii Bowling with RW 5 min, x2 for improved standing tolerance and balance. CGA from SPT. Verbal cues for AD management with STS.  Chest press with 4 lb dowel progressing to 5lb hitting ball to PT. 1 min, 2 sets. Verbal cues for increasing elbow extension during chest press.  Pt educated on use of gel liner to shape limb, for edema management, and for pressure relief. Pt educated on importance of keeping liner from rolling down the limb.   Seated LE therex. Marches and LAQ BLE; 10 reps, 2 sets. Verbal and tactile cues for technique and maintaining eccentric control during exercise.  Ended session with pt seated in WC, call bell within reach, and all needs met.   Therapy Documentation Precautions:  Precautions Precautions: Fall Restrictions Weight Bearing Restrictions: Yes RLE Weight Bearing: Weight bearing as tolerated LLE Weight Bearing: Non weight  bearing   Vital Signs: Therapy Vitals Temp: 97.9 F (36.6 C) Pulse Rate: 92 Resp: 18 BP: 111/68 Patient Position (if appropriate): Sitting Oxygen Therapy SpO2: (!) 89 %(pt denies any shortness of breath or difficulty) O2 Device: Room Air Pain: Pain Assessment Pain Scale: 0-10 Pain Score: 8  Pain Type: Acute pain Pain Location: Leg Pain Orientation: Left Pain Descriptors / Indicators: Aching Pain Onset: On-going Pain Intervention(s): Medication (See eMAR)   Therapy/Group: Individual Therapy  AAmador Cunas11/30/2019, 5:40 PM

## 2018-04-21 NOTE — Plan of Care (Signed)
  Problem: Consults Goal: RH GENERAL PATIENT EDUCATION Description See Patient Education module for education specifics. Outcome: Progressing   Problem: RH BOWEL ELIMINATION Goal: RH STG MANAGE BOWEL W/MEDICATION W/ASSISTANCE Description STG Manage Bowel with Medication with mod  Assistance.  Outcome: Progressing   Problem: RH BLADDER ELIMINATION Goal: RH STG MANAGE BLADDER WITH ASSISTANCE Description STG Manage Bladder With min Assistance  Outcome: Progressing Goal: RH STG MANAGE BLADDER WITH EQUIPMENT WITH ASSISTANCE Description STG Manage Bladder With Equipment With min Assistance  Outcome: Progressing   Problem: RH SKIN INTEGRITY Goal: RH STG SKIN FREE OF INFECTION/BREAKDOWN Description Monitor skin every shift with min assistance  Outcome: Progressing Goal: RH STG MAINTAIN SKIN INTEGRITY WITH ASSISTANCE Description STG Maintain Skin Integrity With min Assistance.  Outcome: Progressing Goal: RH STG ABLE TO PERFORM INCISION/WOUND CARE W/ASSISTANCE Description STG Able To Perform Incision/Wound Care With mod Assistance.  Outcome: Progressing   Problem: RH SAFETY Goal: RH STG ADHERE TO SAFETY PRECAUTIONS W/ASSISTANCE/DEVICE Description STG Adhere to Safety Precautions With min Assistance/Device.  Outcome: Progressing   Problem: RH PAIN MANAGEMENT Goal: RH STG PAIN MANAGED AT OR BELOW PT'S PAIN GOAL Description Pt will report pain less than 2 with min assistance  Outcome: Progressing   Problem: RH KNOWLEDGE DEFICIT GENERAL Goal: RH STG INCREASE KNOWLEDGE OF SELF CARE AFTER HOSPITALIZATION Description Pt will be able to verbalize importance of ABT therapy and to monitor skin after discharge with min assistance  Outcome: Progressing

## 2018-04-22 ENCOUNTER — Inpatient Hospital Stay (HOSPITAL_COMMUNITY): Payer: Self-pay | Admitting: Occupational Therapy

## 2018-04-22 LAB — CBC
HCT: 24.5 % — ABNORMAL LOW (ref 36.0–46.0)
Hemoglobin: 7.3 g/dL — ABNORMAL LOW (ref 12.0–15.0)
MCH: 25.2 pg — ABNORMAL LOW (ref 26.0–34.0)
MCHC: 29.8 g/dL — ABNORMAL LOW (ref 30.0–36.0)
MCV: 84.5 fL (ref 80.0–100.0)
NRBC: 0 % (ref 0.0–0.2)
Platelets: 284 10*3/uL (ref 150–400)
RBC: 2.9 MIL/uL — ABNORMAL LOW (ref 3.87–5.11)
RDW: 15.2 % (ref 11.5–15.5)
WBC: 5.8 10*3/uL (ref 4.0–10.5)

## 2018-04-22 LAB — GLUCOSE, CAPILLARY
Glucose-Capillary: 157 mg/dL — ABNORMAL HIGH (ref 70–99)
Glucose-Capillary: 152 mg/dL — ABNORMAL HIGH (ref 70–99)

## 2018-04-22 NOTE — Plan of Care (Signed)

## 2018-04-22 NOTE — Progress Notes (Signed)
Occupational Therapy Session Note  Patient Details  Name: ALBANA SAPERSTEIN MRN: [id number] Date of Birth: [birth year]  Today's Date: 04/22/2018 OT Group Time: 1101-1201 OT Group Time Calculation (min): 60 min  Skilled Therapeutic Interventions/Progress Updates:    Pt engaged in therapeutic w/c level dance group focusing on patient choice, UE/LE strengthening, salience, activity tolerance, and social participation. Pt was guided through various dance-based exercises involving UEs/LEs and trunk. All music was selected by group members. Emphasis placed on UE strengthening and endurance. Pt actively participated and requested songs. Initiated exercises for residual limb. At end of session pt self propelled herself back to room.    Therapy Documentation Precautions:  Precautions Precautions: Fall Restrictions Weight Bearing Restrictions: Yes RLE Weight Bearing: Weight bearing as tolerated LLE Weight Bearing: Non weight bearing Pain: Pt reported pain to be manageable with rest breaks during group Pain Assessment Pain Scale: 0-10 Pain Score: 8  Pain Type: Acute pain Pain Location: Leg Pain Orientation: Left Pain Descriptors / Indicators: Aching Pain Onset: On-going Pain Intervention(s): Medication (See eMAR) ADL:       Therapy/Group: Group Therapy  Makayia Duplessis A Eternity Dexter 04/22/2018, 12:47 PM

## 2018-04-22 NOTE — Progress Notes (Signed)
Subjective: She has chronic left stump pain.  She feels that her usual lower extremity edema on the right is worse.  She also notes edema of the left stump.  This is also ongoing. Objective: BP 136/80 (BP Location: Left Arm)   Pulse 95   Temp 98.5 F (36.9 C)   Resp 18   Ht 5\' 10"  (1.778 m)   Wt (!) 150.3 kg   SpO2 92%   BMI 47.54 kg/m  Morbidly obese female in no acute distress.  HEENT exam atraumatic, normocephalic, extra ocular muscles are intact.  Neck is supple.  I am unable to appreciate any jugular venous distention.  I cannot feel any lymphadenopathy. Chest clear to auscultation Cardiac exam S1 and S2 are regular Abdominal exam obese, active bowel sounds, soft. Extremities no tenderness around the stump scar this morning.  There is no drainage.  There is edema of the left stump.  This extends proximally to the thigh.  There is also right lower extremity edema to the thigh.  Changes of venous insufficiency on the right and left. She has a peripheral IV proximal right arm. Assessment and plan:  1.  Left lower extremity myositis with strep bacteremia.  Continue inpatient rehab. 2.  DVT prophylaxis with Lovenox 3.  Chronic back pain.  She says this is some better today.  Will continue current therapy. 4.  Skin care continue current skin care regimen. 5.  Group B strep completed therapy on November 28. Type 2 diabetes CBG (last 3)  Recent Labs    04/21/18 1619 04/21/18 2135 04/22/18 0619  GLUCAP 100* 158* 157*  Adequately controlled. 7.  Hypertension adequately controlled.  Continue current medications. 8.  Bipolar disorder on therapy 9.  Anemia of chronic disease.  Will repeat hemoglobin tomorrow. 10.  Morbid obesity.  This is her main problem.  She is not interested in losing weight at this time. 11.  Chronic lower extremity edema with likely because of dependent edema, venous insufficiency, morbid obesity.  She is already on bumetanide.  Will not change dose at this time.   Continue therapy.

## 2018-04-23 ENCOUNTER — Inpatient Hospital Stay (HOSPITAL_COMMUNITY): Payer: Self-pay | Admitting: Occupational Therapy

## 2018-04-23 ENCOUNTER — Inpatient Hospital Stay (HOSPITAL_COMMUNITY): Payer: Self-pay

## 2018-04-23 ENCOUNTER — Inpatient Hospital Stay (HOSPITAL_COMMUNITY): Payer: Self-pay | Admitting: Physical Therapy

## 2018-04-23 LAB — GLUCOSE, CAPILLARY
Glucose-Capillary: 106 mg/dL — ABNORMAL HIGH (ref 70–99)
Glucose-Capillary: 162 mg/dL — ABNORMAL HIGH (ref 70–99)
Glucose-Capillary: 100 mg/dL — ABNORMAL HIGH (ref 70–99)
Glucose-Capillary: 110 mg/dL — ABNORMAL HIGH (ref 70–99)
Glucose-Capillary: 118 mg/dL — ABNORMAL HIGH (ref 70–99)
Glucose-Capillary: 150 mg/dL — ABNORMAL HIGH (ref 70–99)

## 2018-04-23 LAB — CBC
HCT: 23.8 % — ABNORMAL LOW (ref 36.0–46.0)
Hemoglobin: 7.1 g/dL — ABNORMAL LOW (ref 12.0–15.0)
MCH: 25 pg — ABNORMAL LOW (ref 26.0–34.0)
MCHC: 29.8 g/dL — ABNORMAL LOW (ref 30.0–36.0)
MCV: 83.8 fL (ref 80.0–100.0)
Platelets: 290 10*3/uL (ref 150–400)
RBC: 2.84 MIL/uL — ABNORMAL LOW (ref 3.87–5.11)
RDW: 15.2 % (ref 11.5–15.5)
WBC: 6 10*3/uL (ref 4.0–10.5)
nRBC: 0 % (ref 0.0–0.2)

## 2018-04-23 LAB — BASIC METABOLIC PANEL WITH GFR
Anion gap: 3 — ABNORMAL LOW (ref 5–15)
BUN: 36 mg/dL — ABNORMAL HIGH (ref 6–20)
CO2: 30 mmol/L (ref 22–32)
Calcium: 8.5 mg/dL — ABNORMAL LOW (ref 8.9–10.3)
Chloride: 96 mmol/L — ABNORMAL LOW (ref 98–111)
Creatinine, Ser: 1.45 mg/dL — ABNORMAL HIGH (ref 0.44–1.00)
GFR calc Af Amer: 52 mL/min — ABNORMAL LOW
GFR calc non Af Amer: 45 mL/min — ABNORMAL LOW
Glucose, Bld: 128 mg/dL — ABNORMAL HIGH (ref 70–99)
Potassium: 4.6 mmol/L (ref 3.5–5.1)
Sodium: 129 mmol/L — ABNORMAL LOW (ref 135–145)

## 2018-04-23 MED ORDER — BACLOFEN 10 MG PO TABS
10.0000 mg | ORAL_TABLET | Freq: Three times a day (TID) | ORAL | Status: DC
  Administered 2018-04-23 – 2018-05-01 (×26): 10 mg via ORAL
  Filled 2018-04-23 (×26): qty 1

## 2018-04-23 MED ORDER — DM-GUAIFENESIN ER 30-600 MG PO TB12
1.0000 | ORAL_TABLET | Freq: Two times a day (BID) | ORAL | Status: DC
  Administered 2018-04-23 – 2018-05-01 (×17): 1 via ORAL
  Filled 2018-04-23 (×17): qty 1

## 2018-04-23 NOTE — Progress Notes (Signed)
[place] PHYSICAL MEDICINE & REHABILITATION PROGRESS NOTE   Subjective/Complaints: Having a lot of pain from her neck to her left leg. Robaxin not helping. Also with productive cough  ROS: Patient denies fever, rash, sore throat, blurred vision, nausea, vomiting, diarrhea, cough, shortness of breath or chest pain,   headache, or mood change.    Objective:   No results found. Recent Labs    04/22/18 0343 04/23/18 0311  WBC 5.8 6.0  HGB 7.3* 7.1*  HCT 24.5* 23.8*  PLT 284 290   Recent Labs    04/23/18 0311  NA 129*  K 4.6  CL 96*  CO2 30  GLUCOSE 128*  BUN 36*  CREATININE 1.45*  CALCIUM 8.5*    Intake/Output Summary (Last 24 hours) at 04/23/2018 5681 Last data filed at 04/22/2018 1725 Gross per 24 hour  Intake 1000 ml  Output -  Net 1000 ml     Physical Exam: Vital Signs Blood pressure 136/83, pulse 88, temperature 98 F (36.7 C), resp. rate 18, height 5\' 10"  (1.778 m), weight (!) 150.5 kg, SpO2 92 %. Constitutional: No distress . Vital signs reviewed. HEENT: EOMI, oral membranes moist Neck: supple Cardiovascular: RRR without murmur. No JVD    Respiratory: CTA Bilaterally without wheezes or rales. Normal effort    GI: BS +, non-tender, non-distended  Musculoskeletal: left leg with less edema, still tender but better Neurological: She alert and oriented  Motor:   LLE: HF 4/5, limited still by pain  RLE: 4+/5 prox to distal with pain inhibition.  Skin: She isnot diaphoretic.  Left stump with dark skin proximal and distal to the incision site Right foot with chronic wound.  Psychiatric:Normal mood.  Normal behavior.   Assessment/Plan: 1. Functional deficits secondary to LLE myositis which require 3+ hours per day of interdisciplinary therapy in a comprehensive inpatient rehab setting.  Physiatrist is providing close team supervision and 24 hour management of active medical problems listed below.  Physiatrist and rehab team continue to assess barriers  to discharge/monitor patient progress toward functional and medical goals  Care Tool:  Bathing    Body parts bathed by patient: Left arm, Right arm, Chest, Abdomen, Front perineal area, Face, Right upper leg, Left upper leg, Right lower leg, Buttocks   Body parts bathed by helper: Right lower leg Body parts n/a: Left lower leg   Bathing assist Assist Level: Supervision/Verbal cueing     Upper Body Dressing/Undressing Upper body dressing   What is the patient wearing?: Hospital gown only    Upper body assist Assist Level: Set up assist    Lower Body Dressing/Undressing Lower body dressing      What is the patient wearing?: Ace wrap/stump shrinker     Lower body assist Assist for lower body dressing: Set up assist     Toileting Toileting    Toileting assist Assist for toileting: Minimal Assistance - Patient > 75%     Transfers Chair/bed transfer  Transfers assist     Chair/bed transfer assist level: Minimal Assistance - Patient > 75%     Locomotion Ambulation   Ambulation assist   Ambulation activity did not occur: Safety/medical concerns          Walk 10 feet activity   Assist           Walk 50 feet activity   Assist           Walk 150 feet activity   Assist  Walk 10 feet on uneven surface  activity   Assist           Wheelchair     Assist Will patient use wheelchair at discharge?: Yes Type of Wheelchair: Manual    Wheelchair assist level: Supervision/Verbal cueing Max wheelchair distance: 150    Wheelchair 50 feet with 2 turns activity    Assist    Wheelchair 50 feet with 2 turns activity did not occur: Safety/medical concerns(Per report )   Assist Level: Supervision/Verbal cueing   Wheelchair 150 feet activity     Assist Wheelchair 150 feet activity did not occur: Safety/medical concerns(Per report )   Assist Level: Supervision/Verbal cueing     Medical Problem List and  Plan: 1.Functional and mobility deficitssecondary to left lower extremity myositis/strep bacteremia -continue with CIR 2. DVT Prophylaxis/Anticoagulation: Pharmaceutical:Lovenox 3.Chronic back pain/Management: Continue oxycodone as needed, increased to 2 tablets on 11/27  Robaxin 1000 mg 4 times a day scheduled on 11/27---change to baclofen 10mg  TID today  -also discussed use of heat, distraction.   Naproxen on 11/27, d/ced on 11/29   -has to cope with an element of pain 4. Mood:LCSW to follow for evaluation and support. Egosupport to be provided by team 5. Neuropsych: This patientiscapable of making decisions on herown behalf. 6. Skin/Wound Care:Routine pressure relief measures.    -pt aware of shifting, boosting 7. Fluids/Electrolytes/Nutrition:Monitor I's and O's.  -encourage PO intake 8.Group B strep agalactiaebacteremia: Continue IV Ceftriaxone completed on 11/28 9.T2DM with neuropathy:  blood sugars AC at bedtime.     Lantus changed to 5 units twice daily on 11/28 evening  -sugars under control at present 10.Chronic constipation: BM 11/19.  Will continue to augment back bowel program 11.HTN: Monitor blood pressures twice daily.   Bumex restarted---hold given hyponatremia   controlled today 12.Peripheral edema: Monitor weights daily. -compression/elevation of LE's 13.Bipolar disorder with depression: Continue Prozac 14.Acute on?  Chronic renal failure:   Creatinine 1.39 on 11/29  Encourage fluids  Continue to monitor  -recheck labs tomorrow  -sodium down to 129 12/2 15.Anemia of chronic disease/iron deficiency:   iron supplement.   Hemoglobin down to 7.1 12/2, 1 stool heme+, 3 negative  Still normocytic (low end)  -consider GI consult  -re-check tomorrow  -check iron panel tomorrow 16. Morbid obesiy-BMI-48.6: Educate patient on appropriate diet and importance of activity to help promote health and  mobility. 17.  Hyperkalemia  Potassium 4.6 on 12/2  LOS: 13 days A FACE TO [place] 04/23/2018, 8:32 AM

## 2018-04-23 NOTE — Progress Notes (Signed)
Physical Therapy Session Note  Patient Details  Name: [name] [name] MRN: [id number] Date of Birth: Jul 13, 1975  Today's Date: 04/23/2018 PT Individual Time: 1100-1145 PT Individual Time Calculation (min): 45 min   Short Term Goals: Week 2:  PT Short Term Goal 1 (Week 2): pt will ambulate x 10' with prosthesis and min A for bathroom mobility at home PT Short Term Goal 2 (Week 2): pt will perform stand pivot transfers wtih min A  Skilled Therapeutic Interventions/Progress Updates:    pt asleep in w/c, requires loud voice and sternal rub to awaken. Pt visibly fatigued but states she wants to perform therapy.  Pt able to propel w/c only 25' at a time due to fatigue.  Pt requires mod A and 3 attempts to perform sit to stand from w/c. Pt performs stand pivot transfer with mod A. Sit <> stand x 3 from elevated mat with mod A.  Pt able to stand x 3 minute before fatigue.  UE therex with 5# dowel all 3 x 10 for shoulder and elbow strengthening. Pt left in w/c in room with RN aware of fatigue.  Therapy Documentation Precautions:  Precautions Precautions: Fall Restrictions Weight Bearing Restrictions: Yes RLE Weight Bearing: Weight bearing as tolerated LLE Weight Bearing: Non weight bearing General: PT Amount of Missed Time (min): 15 Minutes PT Missed Treatment Reason: Patient fatigue;Patient ill (Comment)(low hemoglobin) Pain: Pt c/o Lt knee pain 10/10 during session, heat applied prior to session and meds rec'd. Pt states she wants to "push through the pain".    Therapy/Group: Individual Therapy  [name] 04/23/2018, 11:51 AM

## 2018-04-23 NOTE — Progress Notes (Signed)
Occupational Therapy Session Note  Patient Details  Name: [name] [name] MRN: [id number] Date of Birth: 26-Jun-1975  Today's Date: 04/23/2018 OT Individual Time: 0733-0900 OT Individual Time Calculation (min): 87 min    Short Term Goals: Week 2:  OT Short Term Goal 1 (Week 2): Pt will complete squat pivot to w/c with max +1 OT Short Term Goal 2 (Week 2): Pt will don pants with mod A +1 OT Short Term Goal 3 (Week 2): Pt will complete sit<>stand with RW with Max A +1 in preparation for BADL tasks OT Short Term Goal 4 (Week 2): Pt will complete toileting with min A  Skilled Therapeutic Interventions/Progress Updates:    Pt greeted semi-reclined in bed finishing breakfast. Pt states she is in a negative mood this morning but is hopeful to work through it. Pt requests to shower this morning. Pt came to sitting EOB with supervision.  Pt reports 8/10 knee pain today, with little relief from medications. Discussed distraction methods and heat application. Sit<>stand from bed with CGA and RW. Stand-pivot with Min A. Stand-pivot onto heavy duty 3-in-1 commode with CGA and heavy use of grab bars. Bathing completed using LH sponge and hand held shower hose. Pt requests to stand in shower to ensure bottom is clean. OT provided set-up A and CGA for balance when standing to wash buttocks. Donned L prosthesis sleeve for edema management with mod A. Pt able to thread B LEs into pant legs. Sit<>stand CGA w 3 trials and assistance to stabilize RW. Pt stated she was too fatigued to try to help pull pants up requiring assist from therapist. Pt needed increased time and more rest breaks than usual today to complete daily tasks. Pt left seated in wc at end of session with needs met.  Therapy Documentation Precautions:  Precautions Precautions: Fall Restrictions Weight Bearing Restrictions: Yes RLE Weight Bearing: Weight bearing as tolerated LLE Weight Bearing: Non weight bearing General:   Vital  Signs: Therapy Vitals Temp: 98 F (36.7 C) Pulse Rate: 88 Resp: 18 BP: 136/83 Patient Position (if appropriate): Sitting Oxygen Therapy SpO2: 92 % O2 Device: Room Air Pain: Pain Assessment Pain Scale: 0-10 Pain Score: 9  Pain Type: Acute pain;Surgical pain Pain Location: Leg Pain Orientation: Left Pain Descriptors / Indicators: Aching;Constant;Discomfort Pain Frequency: Constant Pain Onset: On-going Patients Stated Pain Goal: 3 Pain Intervention(s): Repositioned Multiple Pain Sites: No   Therapy/Group: Individual Therapy  Valma Cava 04/23/2018, 7:52 AM

## 2018-04-23 NOTE — Progress Notes (Addendum)
Physical Therapy Session Note  Patient Details  Name: [name] [name] MRN: [id number] Date of Birth: Oct 01, 1975  Today's Date: 04/23/2018 PT Individual Time: 1503-1601 PT Individual Time Calculation (min): 58 min   Short Term Goals:  Week 2:  PT Short Term Goal 1 (Week 2): pt will ambulate x 10' with prosthesis and min A for bathroom mobility at home PT Short Term Goal 2 (Week 2): pt will perform stand pivot transfers wtih min A    Skilled Therapeutic Interventions/Progress Updates:   Pt stated she was exhausted but still wanted to participate in PT. PT donned pt's R shoe; she was already wearing LLE prosthetic sleeve for edema control.    W/c propulsion using bil UEs and RLE over level tile, x 150' , x 78' with supervision.    RLe strengthening in sitting in w/c: isometric hold of knee in extension x 2 minutes while PT pushed w/c.   Sit> stand outside parallel bars, mod assist, and donned L prosthesis in standing with assistance.  Pt tolerated standing x 2 minutes  X 2, leaning over bar with bil UE support for 1st bout, and able to stand up with extended UEs 2nd bout. After clicking bolt of prosthetic sleeve into prosthesis, in standing, pt needs assist to rotate prosthesis medially for better alignment of knee and socket, due to looseness of socket.  Pt DOE during standing; cues for slow deep breathing were helpful.  PT doffed prosthesis and sleeve.  Pt left resting in w/c with needs at hand    Therapy Documentation Precautions:  Precautions Precautions: Fall Restrictions Weight Bearing Restrictions: Yes RLE Weight Bearing: Weight bearing as tolerated LLE Weight Bearing: Non weight bearing   Vital Signs: Therapy Vitals Pulse Rate: 95 BP: 122/73 Patient Position (if appropriate): Sitting  Pain: Pain Assessment Pain Scale: 0-10 Pain Score: 10-Worst pain ever Pain Type: Acute pain;Surgical pain Pain Location: Leg Pain Orientation: Left Pain Descriptors /  Indicators: Aching;Discomfort Pain Frequency: Constant Pain Onset: On-going Patients Stated Pain Goal: 4 Pain Intervention(s): Medication (See eMAR)  During session.   Therapy/Group: Individual Therapy  Charlsie Fleeger 04/23/2018, 4:06 PM

## 2018-04-24 ENCOUNTER — Inpatient Hospital Stay (HOSPITAL_COMMUNITY): Payer: Self-pay | Admitting: Occupational Therapy

## 2018-04-24 ENCOUNTER — Inpatient Hospital Stay (HOSPITAL_COMMUNITY): Payer: Self-pay | Admitting: Physical Therapy

## 2018-04-24 ENCOUNTER — Inpatient Hospital Stay (HOSPITAL_COMMUNITY): Payer: Self-pay

## 2018-04-24 LAB — CBC
HCT: 24.6 % — ABNORMAL LOW (ref 36.0–46.0)
HEMATOCRIT: 23.2 % — AB (ref 36.0–46.0)
Hemoglobin: 6.9 g/dL — CL (ref 12.0–15.0)
Hemoglobin: 7.1 g/dL — ABNORMAL LOW (ref 12.0–15.0)
MCH: 24.6 pg — ABNORMAL LOW (ref 26.0–34.0)
MCH: 24.1 pg — ABNORMAL LOW (ref 26.0–34.0)
MCHC: 29.7 g/dL — ABNORMAL LOW (ref 30.0–36.0)
MCHC: 28.9 g/dL — ABNORMAL LOW (ref 30.0–36.0)
MCV: 82.9 fL (ref 80.0–100.0)
MCV: 83.4 fL (ref 80.0–100.0)
Platelets: 291 10*3/uL (ref 150–400)
Platelets: 329 10*3/uL (ref 150–400)
RBC: 2.8 MIL/uL — ABNORMAL LOW (ref 3.87–5.11)
RBC: 2.95 MIL/uL — ABNORMAL LOW (ref 3.87–5.11)
RDW: 15.4 % (ref 11.5–15.5)
RDW: 15.4 % (ref 11.5–15.5)
WBC: 5.9 10*3/uL (ref 4.0–10.5)
WBC: 6.1 10*3/uL (ref 4.0–10.5)
nRBC: 0 % (ref 0.0–0.2)
nRBC: 0 % (ref 0.0–0.2)

## 2018-04-24 LAB — GLUCOSE, CAPILLARY
Glucose-Capillary: 110 mg/dL — ABNORMAL HIGH (ref 70–99)
Glucose-Capillary: 117 mg/dL — ABNORMAL HIGH (ref 70–99)
Glucose-Capillary: 142 mg/dL — ABNORMAL HIGH (ref 70–99)
Glucose-Capillary: 137 mg/dL — ABNORMAL HIGH (ref 70–99)

## 2018-04-24 LAB — BASIC METABOLIC PANEL
Anion gap: 9 (ref 5–15)
BUN: 42 mg/dL — ABNORMAL HIGH (ref 6–20)
CHLORIDE: 96 mmol/L — AB (ref 98–111)
CO2: 24 mmol/L (ref 22–32)
Calcium: 8.5 mg/dL — ABNORMAL LOW (ref 8.9–10.3)
Creatinine, Ser: 1.64 mg/dL — ABNORMAL HIGH (ref 0.44–1.00)
GFR calc Af Amer: 45 mL/min — ABNORMAL LOW (ref >60)
GFR calc non Af Amer: 38 mL/min — ABNORMAL LOW (ref >60)
Glucose, Bld: 117 mg/dL — ABNORMAL HIGH (ref 70–99)
Potassium: 4.4 mmol/L (ref 3.5–5.1)
Sodium: 129 mmol/L — ABNORMAL LOW (ref 135–145)

## 2018-04-24 LAB — FERRITIN: Ferritin: 211 ng/mL (ref 11–307)

## 2018-04-24 LAB — OCCULT BLOOD X 1 CARD TO LAB, STOOL: Fecal Occult Bld: NEGATIVE

## 2018-04-24 LAB — IRON AND TIBC
Iron: 19 ug/dL — ABNORMAL LOW (ref 28–170)
Saturation Ratios: 13 % (ref 10.4–31.8)
TIBC: 144 ug/dL — ABNORMAL LOW (ref 250–450)
UIBC: 125 ug/dL

## 2018-04-24 NOTE — Progress Notes (Signed)
Physical Therapy Session Note  Patient Details  Name: [name] [name] MRN: [id number] Date of Birth: 1976/05/10  Today's Date: 04/24/2018 PT Individual Time: 2423-5361 PT Individual Time Calculation (min): 54 min   Short Term Goals: Week 2:  PT Short Term Goal 1 (Week 2): pt will ambulate x 10' with prosthesis and min A for bathroom mobility at home PT Short Term Goal 2 (Week 2): pt will perform stand pivot transfers wtih min A  Skilled Therapeutic Interventions/Progress Updates:    consult with prosthetist with test prosthetic.  Pt able to don prosthetic and perform standing balance and gait 2 x 10'.  Prosthetist to make changes and return prosthetic on Thursday.  Pt educated on prosthetic wear schedule, fit, and fluid fluctuations with pt expressing understanding.  W/c mobility throughout unit up to 50' at a time, distance limited by fatigue.  UBE 4 min fwd/4 min bkwd level 2 with frequent rest breaks due to fatigue.  Pt encouraged by ability to perform gait today. Pt left in room with needs at hand.  Therapy Documentation Precautions:  Precautions Precautions: Fall Restrictions Weight Bearing Restrictions: Yes RLE Weight Bearing: Weight bearing as tolerated LLE Weight Bearing: Non weight bearing Pain: Pt continue to c/o Lt knee pain with flexion, heat applied after session, meds given prior to session.   Therapy/Group: Individual Therapy  [name] [name] 04/24/2018, 1:55 PM

## 2018-04-24 NOTE — Progress Notes (Signed)
Occupational Therapy Weekly Progress Note  Patient Details  Name: [name] [name] MRN: [id number] Date of Birth: 20-Jun-1975  Beginning of progress report period: April 10, 2018 End of progress report period: April 24, 2018  Today's Date: 04/24/2018  Session 1 OT Individual Time: 6962-9528 OT Individual Time Calculation (min): 85 min   Session 2 OT Individual Time: 1030-1100 OT Individual Time Calculation (min): 30 min    Patient has met 4 of 4 short term goals.  Pt has made steady progress towards OT goals this week. Pt is able to complete sit<>stands and stand-pivot transfers with RW and min A within BADL tasks. She has demonstrated improved strength and activity tolerance overall to perform daily tasks. Pt continues to need assistance for LB dressing 2/2 balance and standing endurance deficits. Hopeful pt will get new prosthesis this week and can begin training with prosthesis within BADLs. Continue current POC.  Patient continues to demonstrate the following deficits: muscle weakness, decreased endurance and decreased standing balance and decreased balance strategies and therefore will continue to benefit from skilled OT intervention to enhance overall performance with BADL.  Patient progressing toward long term goals..  Continue plan of care.  OT Short Term Goals Week 2:  OT Short Term Goal 1 (Week 2): Pt will complete squat pivot to w/c with max +1 OT Short Term Goal 1 - Progress (Week 2): Met OT Short Term Goal 2 (Week 2): Pt will don pants with mod A +1 OT Short Term Goal 2 - Progress (Week 2): Met OT Short Term Goal 3 (Week 2): Pt will complete sit<>stand with RW with Max A +1 in preparation for BADL tasks OT Short Term Goal 3 - Progress (Week 2): Met OT Short Term Goal 4 (Week 2): Pt will complete toileting with min A OT Short Term Goal 4 - Progress (Week 2): Met Week 3:  OT Short Term Goal 1 (Week 3): LTG=STG 2/2 ELOS  Skilled Therapeutic Interventions/Progress  Updates:  Session 1   Pt greeted seated EOB finishing breakfast. While pt eating breakfast, discussed OT POC and home bathroom set-up. Pt agreeable to work on tub bench transfers as she used to lower herself into the tub and is now beginning to understand that this will not be a safe option at discharge. Stand-pivot from bed>wc w/ bariatric RW and min A. Min A stand-pivot into shower using grab bars. Bathing completed using LH sponge and supervision. Pt reported need to have a BM after shower, so bucket placed under BSC pt was sitting on to shower 2/2 urgency. Pt with successful BM and completed hygiene with set-up A. Stand-pivot out of shower in similar fashion. OT applied ace wrap to R LE for LE edema. Min A to advance L prosthetic sleeve. Pt left seated in wc at end of session with needs met.   Session 2 Pt greeted seated in wc and agreeable to OT. BUE strengthening and endurance with wc propulsion to therapy gym. UB strengthening with christmas tree activity of straightening limbs to decorate tree. Pt voiced how difficult the holidays are for her as her mother passed away 05-18-23 of last year. Pt tearful recalling favorite holiday memories of her mother. Utilized therapeutic use of self to comfort and encourage pt. Pt appreciative. Pt returned to room and left seated in wc with needs met and call bell in reach.   Therapy Documentation Precautions:  Precautions Precautions: Fall Restrictions Weight Bearing Restrictions: Yes RLE Weight Bearing: Weight bearing as tolerated LLE Weight  Bearing: Non weight bearing Pain: Pain Assessment Pain Scale: 0-10 Pain Score: 8  Pain Type: Chronic pain;Neuropathic pain Pain Location: Leg Pain Orientation: Left Pain Descriptors / Indicators: Aching Pain Frequency: Intermittent Pain Onset: On-going Patients Stated Pain Goal: 2 Pain Intervention(s): Medication (See eMAR);Repositioned;Heat applied Multiple Pain Sites: No   Therapy/Group: Individual  Therapy  Valma Cava 04/24/2018, 12:30 PM

## 2018-04-24 NOTE — Progress Notes (Signed)
Occupational Therapy Session Note  Patient Details  Name: [name] [name] MRN: [id number] Date of Birth: August 18, 1975  Today's Date: 04/24/2018 OT Individual Time: 4276-7011 OT Individual Time Calculation (min): 30 min    Short Term Goals: Week 2:  OT Short Term Goal 1 (Week 2): Pt will complete squat pivot to w/c with max +1 OT Short Term Goal 1 - Progress (Week 2): Met OT Short Term Goal 2 (Week 2): Pt will don pants with mod A +1 OT Short Term Goal 2 - Progress (Week 2): Met OT Short Term Goal 3 (Week 2): Pt will complete sit<>stand with RW with Max A +1 in preparation for BADL tasks OT Short Term Goal 3 - Progress (Week 2): Met OT Short Term Goal 4 (Week 2): Pt will complete toileting with min A OT Short Term Goal 4 - Progress (Week 2): Met  Skilled Therapeutic Interventions/Progress Updates:    OT intervention with focus on w/c mobility, sit<>stand, standing balance, activity tolerance, and safety awareness to increase independence with BADLs.  Pt resting in w/c upon arrival and requested to wash her clothes.  Pt propelled w/c to laundry room with rest breaks X 1.  Pt performed sit<>stand X 2 to remove clothing from laundry bag and placed into washer. Pt required min A for sit<>stand and CGA for standing balance at washer. Pt returned to room and remained in w/c with all needs within reach.   Therapy Documentation Precautions:  Precautions Precautions: Fall Restrictions Weight Bearing Restrictions: Yes RLE Weight Bearing: Weight bearing as tolerated LLE Weight Bearing: Non weight bearing   Pain:  Pt denies pain but c/o discomfort behind R knee 2/2 ace wrap in crease; Ace wrap partially removed and reapplied.  Pt reported relief.   Therapy/Group: Individual Therapy  Leroy Libman 04/24/2018, 11:03 AM

## 2018-04-24 NOTE — Progress Notes (Signed)
[facility] PHYSICAL MEDICINE & REHABILITATION PROGRESS NOTE   Subjective/Complaints: Still having generalized pain. Frustrated by low sodium diet.   ROS: Patient denies fever, rash, sore throat, blurred vision, nausea, vomiting, diarrhea, cough, shortness of breath or chest pain,   headache, or mood change.    Objective:   No results found. Recent Labs    04/24/18 0449 04/24/18 0625  WBC 5.9 6.1  HGB 6.9* 7.1*  HCT 23.2* 24.6*  PLT 291 329   Recent Labs    04/23/18 0311 04/24/18 0449  NA 129* 129*  K 4.6 4.4  CL 96* 96*  CO2 30 24  GLUCOSE 128* 117*  BUN 36* 42*  CREATININE 1.45* 1.64*  CALCIUM 8.5* 8.5*    Intake/Output Summary (Last 24 hours) at 04/24/2018 0829 Last data filed at 04/24/2018 6222 Gross per 24 hour  Intake 840 ml  Output -  Net 840 ml     Physical Exam: Vital Signs Blood pressure 133/79, pulse 88, temperature 98.5 F (36.9 C), resp. rate 18, height 5\' 10"  (1.778 m), weight (!) 150.3 kg, SpO2 91 %. Constitutional: No distress . Vital signs reviewed. HEENT: EOMI, oral membranes moist Neck: supple Cardiovascular: RRR without murmur. No JVD    Respiratory: CTA Bilaterally without wheezes or rales. Normal effort    GI: BS +, non-tender, non-distended  Musculoskeletal: continued edema bilateral LE, 1++ Neurological: She alert and oriented  Motor:   LLE: HF 4/5, limited still by pain somewhat but seems to tolerate movement better RLE: 4+/5 prox to distal with pain inhibition.  Skin: She isnot diaphoretic.  Left stump with dark skin proximal and distal to the incision site Right foot with chronic wound stable.  Psychiatric:anxious.   Assessment/Plan: 1. Functional deficits secondary to LLE myositis which require 3+ hours per day of interdisciplinary therapy in a comprehensive inpatient rehab setting.  Physiatrist is providing close team supervision and 24 hour management of active medical problems listed below.  Physiatrist and rehab team  continue to assess barriers to discharge/monitor patient progress toward functional and medical goals  Care Tool:  Bathing    Body parts bathed by patient: Left arm, Right arm, Chest, Abdomen, Front perineal area, Face, Right upper leg, Left upper leg, Right lower leg, Buttocks   Body parts bathed by helper: Right lower leg Body parts n/a: Left lower leg   Bathing assist Assist Level: Supervision/Verbal cueing     Upper Body Dressing/Undressing Upper body dressing   What is the patient wearing?: Hospital gown only    Upper body assist Assist Level: Set up assist    Lower Body Dressing/Undressing Lower body dressing      What is the patient wearing?: Ace wrap/stump shrinker     Lower body assist Assist for lower body dressing: Set up assist     Toileting Toileting    Toileting assist Assist for toileting: Minimal Assistance - Patient > 75%     Transfers Chair/bed transfer  Transfers assist     Chair/bed transfer assist level: Minimal Assistance - Patient > 75%     Locomotion Ambulation   Ambulation assist   Ambulation activity did not occur: Safety/medical concerns          Walk 10 feet activity   Assist           Walk 50 feet activity   Assist           Walk 150 feet activity   Assist  Walk 10 feet on uneven surface  activity   Assist           Wheelchair     Assist Will patient use wheelchair at discharge?: Yes Type of Wheelchair: Manual    Wheelchair assist level: Supervision/Verbal cueing Max wheelchair distance: 150    Wheelchair 50 feet with 2 turns activity    Assist    Wheelchair 50 feet with 2 turns activity did not occur: Safety/medical concerns(Per report )   Assist Level: Supervision/Verbal cueing   Wheelchair 150 feet activity     Assist Wheelchair 150 feet activity did not occur: Safety/medical concerns(Per report )   Assist Level: Supervision/Verbal cueing      Medical Problem List and Plan: 1.Functional and mobility deficitssecondary to left lower extremity myositis/strep bacteremia -continue with CIR 2. DVT Prophylaxis/Anticoagulation: Pharmaceutical:Lovenox 3.Chronic back pain/Management: Continue oxycodone as needed, increased to 2 tablets on 11/27  Robaxin changed to baclofen 10mg  TID---some improvement?  -also discussed use of heat, distraction.   Naproxen on 11/27, d/ced on 11/29   -has to cope with an element of pain, discussed impact of her chronic pian today.  4. Mood:LCSW to follow for evaluation and support. Egosupport to be provided by team 5. Neuropsych: This patientiscapable of making decisions on herown behalf. 6. Skin/Wound Care:Routine pressure relief measures.    -pt aware of shifting, boosting  -ACE wrap RLE when up during the day for edema control 7. Fluids/Electrolytes/Nutrition:Monitor I's and O's.  -will liberalize diet to regular 8.Group B strep agalactiaebacteremia: Continue IV Ceftriaxone completed on 11/28 9.T2DM with neuropathy:  blood sugars AC at bedtime.     Lantus changed to 5 units twice daily on 11/28 evening  -sugars under control at present 12/3 10.Chronic constipation: BM 11/19.  Will continue to augment back bowel program 11.HTN: Monitor blood pressures twice daily.   Bumex restarted---hold given hyponatremia   under control 12/3 12.Peripheral edema: Monitor weights daily. -compression/elevation of LE's 13.Bipolar disorder with depression: Continue Prozac 14.Acute on  Chronic renal failure:   Creatinine 1.39 on 11/29  Encourage fluids  -follow labs daily  -sodium down to 129 12/2---same today 12/3 15.Anemia of chronic disease with iron deficiency anemia:   iron supplement.   Hemoglobin down to 7.1 12/2---repeat same today  - 1 stool heme+, 3 negative  Still normocytic (low end)  -consider GI consult  - iron panel reviewed and consistent  with above 16. Morbid obesiy-BMI-48.6: Educate patient on appropriate diet and importance of activity to help promote health and mobility. 17.  Hyperkalemia  Potassium 4.6 on 12/2  LOS: 14 days A FACE TO [place] 04/24/2018, 8:29 AM

## 2018-04-25 ENCOUNTER — Inpatient Hospital Stay (HOSPITAL_COMMUNITY): Payer: Self-pay

## 2018-04-25 LAB — BASIC METABOLIC PANEL
Anion gap: 9 (ref 5–15)
BUN: 43 mg/dL — ABNORMAL HIGH (ref 6–20)
CO2: 24 mmol/L (ref 22–32)
Calcium: 8.6 mg/dL — ABNORMAL LOW (ref 8.9–10.3)
Chloride: 98 mmol/L (ref 98–111)
Creatinine, Ser: 1.6 mg/dL — ABNORMAL HIGH (ref 0.44–1.00)
GFR calc Af Amer: 46 mL/min — ABNORMAL LOW (ref >60)
GFR calc non Af Amer: 40 mL/min — ABNORMAL LOW (ref >60)
Glucose, Bld: 125 mg/dL — ABNORMAL HIGH (ref 70–99)
Potassium: 4.4 mmol/L (ref 3.5–5.1)
Sodium: 131 mmol/L — ABNORMAL LOW (ref 135–145)

## 2018-04-25 LAB — CBC
HCT: 23.4 % — ABNORMAL LOW (ref 36.0–46.0)
Hemoglobin: 7 g/dL — ABNORMAL LOW (ref 12.0–15.0)
MCH: 24.8 pg — ABNORMAL LOW (ref 26.0–34.0)
MCHC: 29.9 g/dL — ABNORMAL LOW (ref 30.0–36.0)
MCV: 83 fL (ref 80.0–100.0)
Platelets: 306 10*3/uL (ref 150–400)
RBC: 2.82 MIL/uL — ABNORMAL LOW (ref 3.87–5.11)
RDW: 15.5 % (ref 11.5–15.5)
WBC: 5.1 10*3/uL (ref 4.0–10.5)
nRBC: 0.6 % — ABNORMAL HIGH (ref 0.0–0.2)

## 2018-04-25 LAB — GLUCOSE, CAPILLARY
Glucose-Capillary: 109 mg/dL — ABNORMAL HIGH (ref 70–99)
Glucose-Capillary: 102 mg/dL — ABNORMAL HIGH (ref 70–99)
Glucose-Capillary: 124 mg/dL — ABNORMAL HIGH (ref 70–99)
Glucose-Capillary: 139 mg/dL — ABNORMAL HIGH (ref 70–99)

## 2018-04-25 LAB — OCCULT BLOOD X 1 CARD TO LAB, STOOL: Fecal Occult Bld: POSITIVE — AB

## 2018-04-25 NOTE — Progress Notes (Signed)
Physical Therapy Session Note  Patient Details  Name: [name] [name] MRN: [id number] Date of Birth: [birth year]  Today's Date: 04/25/2018 PT Individual Time: 1300-1405 PT Individual Time Calculation (min): 65 min   Short Term Goals: Week 2:  PT Short Term Goal 1 (Week 2): pt will ambulate x 10' with prosthesis and min A for bathroom mobility at home PT Short Term Goal 2 (Week 2): pt will perform stand pivot transfers wtih min A    Skilled Therapeutic Interventions/Progress Updates:   Pt's Hg 7.0 this AM.  Pam, PA instructed PT to treat pt unless symptomatic.    Brooke Pace, [facility] has pt's prosthesis and plans to return it tomorrow.  Pt seated in w/c, wearing L prosthetic sleeve (new one which extends much higher on upper leg).    Pain L knee 5/10, premedicated.  O2 sats 94% on room air, HR 91 at rest.  W/c propulsion using bil UEs and RLE x 40' x 3 with rest breaks required due to fatigue.  Strengthening in sitting in w/c, using RLE unilaterally using Kinetron at level 40 cm/sec x 25 cycles targeting quadruceps, x 25 cycles x 2 targeting gluteal muscles., in slight trunk flexion. Seated R ankle pumps with extended knee, x 12.   Stand pivot with RW w/c> mat (at approximate ht of pt's sofa at home) to R with mod assist to come to standing, requiring 2 attempts.  Pt improved with cues to flex L hip before attempting to stand, which is less painful than allowing R residual limb to hang down as she stands.  Lateral scoot mat> w/c to R slightly downhill. ( L armrest of w/c would not remove. )  PT managed R armrest off/on w/c and assisted pt to remove and replace L leg rest of w/c with min assist.  Discussed possible need to do lateral scoot transfer if pt is resting on sofa at home, needs to use toilet but feels L residual limb is too painful to attempt standing.   Pt left resting in w/c with needs at hand, K pad on L thigh with towels over K pad to hold it snug over limb.     Therapy  Documentation Precautions:  Precautions Precautions: Fall Restrictions Weight Bearing Restrictions: Yes RLE Weight Bearing: Weight bearing as tolerated LLE Weight Bearing: Non weight bearing  Pain: Pain Assessment Pain Scale: 0-10 Pain Score: 5 Pain Type: Chronic pain Pain Location: Leg Pain Orientation: Left Pain Descriptors / Indicators: Aching;Constant Pain Frequency: Constant Pain Onset: On-going Patients Stated Pain Goal: 3 Pain Intervention(s): Medication (See eMAR); K pad at end of tx   Therapy/Group: Individual Therapy  Edwinna Rochette 04/25/2018, 4:06 PM

## 2018-04-25 NOTE — Progress Notes (Signed)
Occupational Therapy Session Note  Patient Details  Name: [name] [name] MRN: [id number] Date of Birth: 05-31-1975  Today's Date: 04/25/2018 OT Individual Time: 9470-9628 OT Individual Time Calculation (min): 71 min    Short Term Goals: Week 1:  OT Short Term Goal 1 (Week 1): Pt will don pants with mod A +1 OT Short Term Goal 1 - Progress (Week 1): Progressing toward goal OT Short Term Goal 2 (Week 1): Pt will don shirt sitting EOB with (S) for improved sitting balance OT Short Term Goal 2 - Progress (Week 1): Met OT Short Term Goal 3 (Week 1): Pt will complete squat pivot to w/c with max +1 OT Short Term Goal 3 - Progress (Week 1): Progressing toward goal  Skilled Therapeutic Interventions/Progress Updates:    1;1. Pt recived in w/c with 7/10 pain in residual limb. Pt reporting needing medication and RN alerted/delivers. Pt propels w/c to/from gift shop for community level w/c propulsion training and BUE endurance. Pt requires frequent rest breaks. Pt propels over carpet for practice prior to going home as this is the surfaces she has at home. Pt propels w/c around gift shop for slose quarters w/c steering training with VC for using RLE for steering around displays. Pt returns to room to toilet sit>stand with min lifting A at RW and A to advance pants past hips. Pt completes B&B movement seated on toilet with increased time. NT alerted to A with pt post BM as OT needs to exit session. PT seated on BSC and verbalizes using call bell to alert staff for A back to w/c.   Therapy Documentation Precautions:  Precautions Precautions: Fall Restrictions Weight Bearing Restrictions: Yes RLE Weight Bearing: Weight bearing as tolerated LLE Weight Bearing: Non weight bearing General:   Vital Signs: Therapy Vitals Pulse Rate: 89 Resp: 17 BP: 131/82 Patient Position (if appropriate): Sitting Oxygen Therapy SpO2: 95 % O2 Device: Room Air Pain: Pain Assessment Pain Scale: 0-10 Pain  Score: 7  Pain Type: Chronic pain Pain Location: Leg Pain Orientation: Left Pain Descriptors / Indicators: Aching;Constant Pain Frequency: Constant Pain Onset: On-going Patients Stated Pain Goal: 3 Pain Intervention(s): Medication (See eMAR) Multiple Pain Sites: No ADL:     Therapy/Group: Individual Therapy  SHAWNTEL FARNWORTH 04/25/2018, 3:21 PM

## 2018-04-25 NOTE — Progress Notes (Signed)
[facility] PHYSICAL MEDICINE & REHABILITATION PROGRESS NOTE   Subjective/Complaints: Happy about diet change. However, states that stomach is in "knots" this morning. Otherwise working through therapies despite pain  ROS: Patient denies fever, rash, sore throat, blurred vision, nausea, vomiting, diarrhea, cough, shortness of breath or chest pain, joint or back pain, headache, or mood change.   Objective:   No results found. Recent Labs    04/24/18 0625 04/25/18 0444  WBC 6.1 5.1  HGB 7.1* 7.0*  HCT 24.6* 23.4*  PLT 329 306   Recent Labs    04/24/18 0449 04/25/18 0444  NA 129* 131*  K 4.4 4.4  CL 96* 98  CO2 24 24  GLUCOSE 117* 125*  BUN 42* 43*  CREATININE 1.64* 1.60*  CALCIUM 8.5* 8.6*    Intake/Output Summary (Last 24 hours) at 04/25/2018 0852 Last data filed at 04/24/2018 1700 Gross per 24 hour  Intake 420 ml  Output -  Net 420 ml     Physical Exam: Vital Signs Blood pressure 132/79, pulse 88, temperature 97.8 F (36.6 C), resp. rate (!) 22, height 5\' 10"  (1.778 m), weight (!) 155.6 kg, SpO2 100 %. Constitutional: No distress . Vital signs reviewed. HEENT: EOMI, oral membranes moist Neck: supple Cardiovascular: RRR without murmur. No JVD    Respiratory: CTA Bilaterally without wheezes or rales. Normal effort    GI: BS +, non-tender, non-distended  Musculoskeletal: continued edema bilateral LE, 1+ to 2+ Neurological: She alert and oriented  Motor:   LLE: HF 4/5, limited still by pain somewhat but seems to tolerate movement better RLE: 4+/5 prox to distal with pain inhibition.  Skin: She isnot diaphoretic.  Left stump intact, dry skin Right foot with chronic wound stable.  Psychiatric:pleasant but anxious.   Assessment/Plan: 1. Functional deficits secondary to LLE myositis which require 3+ hours per day of interdisciplinary therapy in a comprehensive inpatient rehab setting.  Physiatrist is providing close team supervision and 24 hour management of  active medical problems listed below.  Physiatrist and rehab team continue to assess barriers to discharge/monitor patient progress toward functional and medical goals  Care Tool:  Bathing    Body parts bathed by patient: Left arm, Right arm, Chest, Abdomen, Front perineal area, Face, Right upper leg, Left upper leg, Right lower leg, Buttocks   Body parts bathed by helper: Right lower leg Body parts n/a: Left lower leg   Bathing assist Assist Level: Supervision/Verbal cueing     Upper Body Dressing/Undressing Upper body dressing   What is the patient wearing?: Hospital gown only    Upper body assist Assist Level: Set up assist    Lower Body Dressing/Undressing Lower body dressing      What is the patient wearing?: Ace wrap/stump shrinker     Lower body assist Assist for lower body dressing: Set up assist     Toileting Toileting    Toileting assist Assist for toileting: Minimal Assistance - Patient > 75%     Transfers Chair/bed transfer  Transfers assist     Chair/bed transfer assist level: Supervision/Verbal cueing     Locomotion Ambulation   Ambulation assist   Ambulation activity did not occur: Safety/medical concerns  Assist level: Minimal Assistance - Patient > 75% Assistive device: Walker-rolling Max distance: 10   Walk 10 feet activity   Assist     Assist level: Minimal Assistance - Patient > 75% Assistive device: Walker-rolling, Prothesis   Walk 50 feet activity   Assist  Walk 150 feet activity   Assist           Walk 10 feet on uneven surface  activity   Assist           Wheelchair     Assist Will patient use wheelchair at discharge?: Yes Type of Wheelchair: Manual    Wheelchair assist level: Supervision/Verbal cueing Max wheelchair distance: 150    Wheelchair 50 feet with 2 turns activity    Assist    Wheelchair 50 feet with 2 turns activity did not occur: Safety/medical concerns(Per  report )   Assist Level: Supervision/Verbal cueing   Wheelchair 150 feet activity     Assist Wheelchair 150 feet activity did not occur: Safety/medical concerns(Per report )   Assist Level: Supervision/Verbal cueing     Medical Problem List and Plan: 1.Functional and mobility deficitssecondary to left lower extremity myositis/strep bacteremia -continue with CIR 2. DVT Prophylaxis/Anticoagulation: Pharmaceutical:Lovenox 3.Chronic back pain/Management: Continue oxycodone as needed, increased to 2 tablets on 11/27   baclofen 10mg  TID  -also discussed use of heat, distraction.   Naproxen on 11/27, d/ced on 11/29 4. Mood:LCSW to follow for evaluation and support. Egosupport to be provided by team 5. Neuropsych: This patientiscapable of making decisions on herown behalf. 6. Skin/Wound Care:Routine pressure relief measures.    -pt aware of shifting, boosting  -ACE wrap RLE when up during the day for edema control 7. Fluids/Electrolytes/Nutrition:Monitor I's and O's.  -changed diet to regular--spoke to her about making smart diet decisions 8.Group B strep agalactiaebacteremia: Continue IV Ceftriaxone completed on 11/28 9.T2DM with neuropathy:  blood sugars AC at bedtime.     Lantus changed to 5 units twice daily on 11/28 evening  -sugars under control at present 12/5 10.Chronic constipation: BM 11/19.  Will continue to augment back bowel program 11.HTN: Monitor blood pressures twice daily.   Bumex restarted---held given hyponatremia   under control 12/5 12.Peripheral edema: Monitor weights daily. -compression/elevation of LE's  -need weight today---weights fluctuating from 150 to 155kg  13.Bipolar disorder with depression: Continue Prozac 14.Acute on  Chronic renal failure:   Creatinine 1.39 on 11/29  Encourage fluids  -follow labs daily  -sodium down to 131 12/4---pending for today 15.Anemia of chronic disease with iron  deficiency anemia:   iron supplement.   Hemoglobin down to 7.0 12/3---pending today  - 1 stool heme+, 3 negative  Still normocytic (low end)  -consider GI consult  - iron panel reviewed and consistent with above 16. Morbid obesiy-BMI-48.6: Educate patient on appropriate diet and importance of activity to help promote health and mobility. 17.  Hyperkalemia  Potassium 4.4 on 12/4  LOS: 15 days A FACE TO [place] 04/25/2018, 8:52 AM

## 2018-04-25 NOTE — Progress Notes (Signed)
Occupational Therapy Session Note  Patient Details  Name: LOYOLA SANTINO MRN: [id number] Date of Birth: 19-Mar-1976  Today's Date: 04/25/2018 OT Individual Time: 3074-6002 OT Individual Time Calculation (min): 72 min    Short Term Goals: Week 3:  OT Short Term Goal 1 (Week 3): LTG=STG 2/2 ELOS  Skilled Therapeutic Interventions/Progress Updates:    Session focused on b/d tasks at shower level and functional transfers. Pt completed stand pivot transfer from w/c into bari BSC in walk in shower with min A. Once seated on BSC pt able to complete all bathing UB/LB with set up, using lateral leans. At one point pt fell asleep in shower and reported "this is what I always do". Pt was educated extensively on safety risk of doing so- both on herself and on environment. Pt requried max A to don pants d/t time constraints. Set up to don shirt. RN was alerted to needing lidocane patch on residual limb. Pt c/o pain in her L residual limb with movement, with 10/10 pain and pt yelling when donning shrinker. Pt left sitting up in w/c with all needs met.   Therapy Documentation Precautions:  Precautions Precautions: Fall Restrictions Weight Bearing Restrictions: Yes RLE Weight Bearing: Weight bearing as tolerated LLE Weight Bearing: Non weight bearing   Therapy/Group: Individual Therapy  Curtis Sites 04/25/2018, 12:22 PM

## 2018-04-26 ENCOUNTER — Inpatient Hospital Stay (HOSPITAL_COMMUNITY): Payer: Self-pay | Admitting: Occupational Therapy

## 2018-04-26 ENCOUNTER — Inpatient Hospital Stay (HOSPITAL_COMMUNITY): Payer: Self-pay | Admitting: Physical Therapy

## 2018-04-26 ENCOUNTER — Encounter (HOSPITAL_COMMUNITY): Payer: Self-pay | Admitting: Physical Therapy

## 2018-04-26 LAB — GLUCOSE, CAPILLARY
GLUCOSE-CAPILLARY: 94 mg/dL (ref 70–99)
Glucose-Capillary: 101 mg/dL — ABNORMAL HIGH (ref 70–99)
Glucose-Capillary: 112 mg/dL — ABNORMAL HIGH (ref 70–99)
Glucose-Capillary: 99 mg/dL (ref 70–99)
Glucose-Capillary: 148 mg/dL — ABNORMAL HIGH (ref 70–99)

## 2018-04-26 LAB — OCCULT BLOOD X 1 CARD TO LAB, STOOL: FECAL OCCULT BLD: NEGATIVE

## 2018-04-26 MED ORDER — ENOXAPARIN SODIUM 80 MG/0.8ML ~~LOC~~ SOLN
75.0000 mg | SUBCUTANEOUS | Status: DC
  Administered 2018-04-26 – 2018-04-30 (×5): 75 mg via SUBCUTANEOUS
  Filled 2018-04-26 (×6): qty 0.75

## 2018-04-26 MED ORDER — PREGABALIN 25 MG PO CAPS
25.0000 mg | ORAL_CAPSULE | Freq: Two times a day (BID) | ORAL | Status: DC
  Administered 2018-04-26 – 2018-05-01 (×10): 25 mg via ORAL
  Filled 2018-04-26 (×10): qty 1

## 2018-04-26 NOTE — Progress Notes (Signed)
[name] [name] PHYSICAL MEDICINE & REHABILITATION PROGRESS NOTE   Subjective/Complaints: In relatively good spirits. Anxious to go on outing today with therapy. Asked about left knee which is rotated laterally a bit. Pain levels about the same  ROS: Patient denies fever, rash, sore throat, blurred vision, nausea, vomiting, diarrhea, cough, shortness of breath or chest pain, joint or back pain, headache, or mood change.   Objective:   No results found. Recent Labs    04/24/18 0625 04/25/18 0444  WBC 6.1 5.1  HGB 7.1* 7.0*  HCT 24.6* 23.4*  PLT 329 306   Recent Labs    04/24/18 0449 04/25/18 0444  NA 129* 131*  K 4.4 4.4  CL 96* 98  CO2 24 24  GLUCOSE 117* 125*  BUN 42* 43*  CREATININE 1.64* 1.60*  CALCIUM 8.5* 8.6*    Intake/Output Summary (Last 24 hours) at 04/26/2018 0958 Last data filed at 04/25/2018 1725 Gross per 24 hour  Intake 462 ml  Output -  Net 462 ml     Physical Exam: Vital Signs Blood pressure (!) 155/92, pulse 93, temperature 98.1 F (36.7 C), resp. rate 17, height 5\' 10"  (1.778 m), weight (!) 152.4 kg, SpO2 91 %. Constitutional: No distress . Vital signs reviewed. HEENT: EOMI, oral membranes moist Neck: supple Cardiovascular: RRR without murmur. No JVD    Respiratory: CTA Bilaterally without wheezes or rales. Normal effort    GI: BS +, non-tender, non-distended  Musculoskeletal: continued edema bilateral LE, 1+ to 2+--stable. Some lateral rotation of LLE Neurological: She alert and oriented  Motor:   LLE: HF 4/5, limited still by pain somewhat but seems to tolerate movement better RLE: 4+/5 prox to distal with pain inhibition.  Skin: She isnot diaphoretic.  Left stump intact, dry skin Right foot with chronic wound-stable Psychiatric:pleasant but anxious.   Assessment/Plan: 1. Functional deficits secondary to LLE myositis which require 3+ hours per day of interdisciplinary therapy in a comprehensive inpatient rehab setting.  Physiatrist is  providing close team supervision and 24 hour management of active medical problems listed below.  Physiatrist and rehab team continue to assess barriers to discharge/monitor patient progress toward functional and medical goals  Care Tool:  Bathing    Body parts bathed by patient: Left arm, Right arm, Chest, Abdomen, Front perineal area, Face, Right upper leg, Left upper leg, Right lower leg, Buttocks   Body parts bathed by helper: Right lower leg Body parts n/a: Left lower leg   Bathing assist Assist Level: Supervision/Verbal cueing     Upper Body Dressing/Undressing Upper body dressing   What is the patient wearing?: Pull over shirt    Upper body assist Assist Level: Set up assist    Lower Body Dressing/Undressing Lower body dressing      What is the patient wearing?: Pants     Lower body assist Assist for lower body dressing: Maximal Assistance - Patient 25 - 49%(d/t time constraints)     Toileting Toileting    Toileting assist Assist for toileting: Minimal Assistance - Patient > 75%     Transfers Chair/bed transfer  Transfers assist     Chair/bed transfer assist level: Moderate Assistance - Patient 50 - 74%     Locomotion Ambulation   Ambulation assist   Ambulation activity did not occur: Safety/medical concerns  Assist level: Minimal Assistance - Patient > 75% Assistive device: Walker-rolling Max distance: 10   Walk 10 feet activity   Assist     Assist level: Minimal Assistance - Patient >  75% Assistive device: Walker-rolling, Prothesis   Walk 50 feet activity   Assist           Walk 150 feet activity   Assist           Walk 10 feet on uneven surface  activity   Assist           Wheelchair     Assist Will patient use wheelchair at discharge?: Yes Type of Wheelchair: Manual    Wheelchair assist level: Supervision/Verbal cueing Max wheelchair distance: 120    Wheelchair 50 feet with 2 turns  activity    Assist    Wheelchair 50 feet with 2 turns activity did not occur: Safety/medical concerns(Per report )   Assist Level: Supervision/Verbal cueing   Wheelchair 150 feet activity     Assist Wheelchair 150 feet activity did not occur: Safety/medical concerns(Per report )   Assist Level: Supervision/Verbal cueing     Medical Problem List and Plan: 1.Functional and mobility deficitssecondary to left lower extremity myositis/strep bacteremia -may go on outing today 2. DVT Prophylaxis/Anticoagulation: Pharmaceutical:Lovenox 3.Chronic back pain/Management: Continue oxycodone as needed, increased to 2 tablets on 11/27   baclofen 10mg  TID    -also discussed use of heat, distraction.   Naproxen on 11/27, d/ced on 11/29 4. Mood:LCSW to follow for evaluation and support. Egosupport to be provided by team 5. Neuropsych: This patientiscapable of making decisions on herown behalf. 6. Skin/Wound Care:Routine pressure relief measures.    -continue shifting, boosting  -ACE wrap RLE when up during the day for edema control 7. Fluids/Electrolytes/Nutrition:Monitor I's and O's.  -changed diet to regular--spoke to her about making smart diet decisions 8.Group B strep agalactiaebacteremia: Continue IV Ceftriaxone completed on 11/28 9.T2DM with neuropathy:  blood sugars AC at bedtime.     Lantus changed to 5 units twice daily on 11/28 evening  -sugars under control at present 12/5 10.Chronic constipation: BM 11/19.  Will continue to augment back bowel program 11.HTN: Monitor blood pressures twice daily.   Bumex restarted---held given hyponatremia   under control 12/5 12.Peripheral edema: Monitor weights daily. -compression/elevation of LE's  -need weight today---weights fluctuating from 150 to 155kg  13.Bipolar disorder with depression: Continue Prozac 14.Acute on  Chronic renal failure:   Creatinine 1.39 on  11/29  Encourage fluids   -sodium 131 12/4---recheck tomorrow 15.Anemia of chronic disease with iron deficiency anemia:   iron supplement.   Hemoglobin continues to hover around 7  - 1 stool heme+, 3 negative  Still normocytic (low end)  -consider GI consult vs GI  - iron panel reviewed and consistent with above  =recheck cbc in AM 16. Morbid obesiy-BMI-48.6: Educate patient on appropriate diet and importance of activity to help promote health and mobility. 17.  Hyperkalemia  Potassium 4.4 on 12/4  LOS: 16 days A FACE TO [place] 04/26/2018, 9:58 AM

## 2018-04-26 NOTE — Progress Notes (Addendum)
Recreational Therapy Session Note  Patient Details  Name: [name] [name] MRN: [id number] Date of Birth: [birth year] Today's Date: 04/26/2018 Time:  1040-1200 Pain: no c/o Skilled Therapeutic Interventions/Progress Updates: Pt participated in community reintegration/outing to [place] at [employer] supervision w/c level.  Goals focused on safe community mobility, identification & negotiation of obstacles, accessing public restroom, energy conservation techniques/education.  See outing goal sheet in shadow chart for full details.   Therapy/Group: [employer]  Marcine Gadway 04/26/2018, 12:13 PM

## 2018-04-26 NOTE — Patient Care Conference (Signed)
Inpatient RehabilitationTeam Conference and Plan of Care Update Date: 04/24/2018   Time: 2:35 PM    Patient Name: [name] [name]      Medical Record Number: [id number]  Date of Birth: November 29, 1975 Sex: Female         Room/Bed: 4W04C/4W04C-01 Payor Info: Payor: MEDICARE / Plan: MEDICARE PART A AND B / Product Type: *No Product type* /    Admitting Diagnosis: Debility  Admit Date/Time:  04/10/2018  6:11 PM Admission Comments: No comment available   Primary Diagnosis:  <principal problem not specified> Principal Problem: <principal problem not specified>  Patient Active Problem List   Diagnosis Date Noted  . AKI (acute kidney injury) ([place])   . Hypoglycemia   . Current moderate episode of major depressive disorder ([place])   . Acute blood loss anemia   . Hyperkalemia   . Morbid obesity ([place])   . Anemia of chronic disease   . Peripheral edema   . Essential hypertension   . Diabetes mellitus type 2 in obese ([place])   . Bacteremia   . Debility 04/10/2018  . Bacteremia due to group B Streptococcus 04/04/2018  . Severe protein-calorie malnutrition ([place])   . BKA stump complication ([place]) 27/10/2374  . Vaginal candidiasis 03/12/2018  . Herpes genitalis in women 01/31/2018  . Herpes simplex type 1 infection 01/16/2018  . Status post below knee amputation, left ([place]) 04/11/2017  . Bipolar affective disorder ([place])   . Adjustment disorder with depressed mood 03/26/2017  . Abnormal ECG 03/24/2017  . Sepsis ([place]) 03/24/2017  . Heart failure with preserved ejection fraction ([place]) 02/09/2017  . Routine adult health maintenance 12/08/2016  . Chronic pain of right knee 09/08/2016  . Chronic kidney disease (CKD), stage III (moderate) ([place]) 07/15/2016  . OSA (obstructive sleep apnea) 05/13/2016  . Morbid obesity due to excess calories ([place]) 11/27/2015  . History of Low serum cortisol level ([place]) 01/07/2015  . Hypothyroidism 04/25/2013  . Financial difficulty 04/25/2013  . Acute renal failure  ([place]) 07/12/2012  . Uncontrolled type 2 diabetes mellitus ([place]) 07/12/2012  . Necrotizing fasciitis s/p OR debridements 07/06/2012  . Carpal tunnel syndrome, bilateral 01/25/2012  . Diabetic peripheral neuropathy ([place]) 07/04/2011  . Anxiety and depression 06/02/2011  . History of abnormal cervical Pap smear 12/13/2007  . GERD 12/06/2007  . Hyperlipidemia 08/29/2007  . Hypertension associated with diabetes ([place]) 08/29/2007    Expected Discharge Date: Expected Discharge Date: 05/01/18  Team Members Present: Physician leading conference: Dr. Alger Simons Social Worker Present: Lennart Pall, LCSW Nurse Present: Dorthula Nettles, RN PT Present: Roderic Ovens, PT OT Present: Cherylynn Ridges, OT SLP Present: Weston Anna, SLP PPS Coordinator present : Daiva Nakayama, RN, CRRN;Melissa Gertie Fey     Current Status/Progress Goal Weekly Team Focus  Medical   ongoing pain issues, sodium low, anemic   maximize safety, activity tolerance  electrolyte,renal mgt, reduce pain, treat anemia, ?GI work up   Bowel/Bladder   Patient is continent B/B. LBM 04/23/2018.  Remain continent B/B and maintain regular bowel pattern.  Assist with toileting needs PRN.   Swallow/Nutrition/ Hydration             ADL's   Min A/CGA stand-pivot transfers, Min A sit<>stand, fatigues quickly, Min A LB self-care  supervision, mod i  LB self-care, modified bathing/dressing, endurance, general strengthening,  sit<>stand   Mobility   supervision transfers, min/mod A for sit <> stand, supervision w/c  supervision transfers and w/c mobility, min A car transfer, no ambulation goals  activity tolerance,  prosthetic consult   Communication             Safety/Cognition/ Behavioral Observations            Pain   Patient complains of pain of 8 to L lower leg at BKA site. Pain managed by Percocet.  Pain < 4  Assess pain Q shift and PRN. Medicate per order   Skin   Patient has swelling, edema, and cracking to the L BKA site.   Maintain skin integrity and prevent skin breakdown.  Assess skin Q shift and PRN.       *See Care Plan and progress notes for long and short-term goals.     Barriers to Discharge  Current Status/Progress Possible Resolutions Date Resolved   Physician    Medical stability;Behavior        continued medical mgt as above      Nursing                  PT                    OT                  SLP                SW                Discharge Planning/Teaching Needs:  Pt to d/c home with nephew who has not gotten a job and can only provide intermittent assistance.  Will need education with nephew prior to d/c.   Team Discussion:  Close medical management;  Watching labs.  Will need close med management as outpatient as well.  Pt working through pain.  Trial of adapted prosthetic and hope for final product by end of week.  Min assist stand-pivot.  Still needs a ramp.  Mod ind w/c goals.  Revisions to Treatment Plan:  NA    Continued Need for Acute Rehabilitation Level of Care: The patient requires daily medical management by a physician with specialized training in physical medicine and rehabilitation for the following conditions: Daily direction of a multidisciplinary physical rehabilitation program to ensure safe treatment while eliciting the highest outcome that is of practical value to the patient.: Yes Daily medical management of patient stability for increased activity during participation in an intensive rehabilitation regime.: Yes Daily analysis of laboratory values and/or radiology reports with any subsequent need for medication adjustment of medical intervention for : Neurological problems;Renal problems   I attest that I was present, lead the team conference, and concur with the assessment and plan of the team.   Rhyan Radler 04/26/2018, 3:22 PM

## 2018-04-26 NOTE — Plan of Care (Signed)
  Problem: Consults Goal: RH GENERAL PATIENT EDUCATION Description See Patient Education module for education specifics. Outcome: Progressing   Problem: RH BOWEL ELIMINATION Goal: RH STG MANAGE BOWEL W/MEDICATION W/ASSISTANCE Description STG Manage Bowel with Medication with mod  Assistance.  Outcome: Progressing   Problem: RH BLADDER ELIMINATION Goal: RH STG MANAGE BLADDER WITH ASSISTANCE Description STG Manage Bladder With min Assistance  Outcome: Progressing Goal: RH STG MANAGE BLADDER WITH EQUIPMENT WITH ASSISTANCE Description STG Manage Bladder With Equipment With min Assistance  Outcome: Progressing   Problem: RH SKIN INTEGRITY Goal: RH STG SKIN FREE OF INFECTION/BREAKDOWN Description Monitor skin every shift with min assistance  Outcome: Progressing Goal: RH STG MAINTAIN SKIN INTEGRITY WITH ASSISTANCE Description STG Maintain Skin Integrity With min Assistance.  Outcome: Progressing Goal: RH STG ABLE TO PERFORM INCISION/WOUND CARE W/ASSISTANCE Description STG Able To Perform Incision/Wound Care With mod Assistance.  Outcome: Progressing   Problem: RH SAFETY Goal: RH STG ADHERE TO SAFETY PRECAUTIONS W/ASSISTANCE/DEVICE Description STG Adhere to Safety Precautions With min Assistance/Device.  Outcome: Progressing   Problem: RH PAIN MANAGEMENT Goal: RH STG PAIN MANAGED AT OR BELOW PT'S PAIN GOAL Description Pt will report pain less than 2 with min assistance  Outcome: Progressing   Problem: RH KNOWLEDGE DEFICIT GENERAL Goal: RH STG INCREASE KNOWLEDGE OF SELF CARE AFTER HOSPITALIZATION Description Pt will be able to verbalize importance of ABT therapy and to monitor skin after discharge with min assistance  Outcome: Progressing

## 2018-04-26 NOTE — Progress Notes (Signed)
Occupational Therapy Session Note  Patient Details  Name: [name] [name] MRN: [id number] Date of Birth: 04-Jun-1975  Today's Date: 04/26/2018 OT Individual Time: 4166-0630 OT Individual Time Calculation (min): 57 min   Short Term Goals: Week 3:  OT Short Term Goal 1 (Week 3): LTG=STG 2/2 ELOS  Skilled Therapeutic Interventions/Progress Updates:   Pt greeted semi-reclined in bed with nursing present. Pt incontinent of urine in the bed. Felt she still needed to use the bathroom so completed stand-pivot to wc with heavy duty RW and min A. Stand-pivot to commode with min A and RW and was able to lower self onto commode using grab bars. Pt urinated more on toilet and completed peri-care with set-up A. Sit<>stand from regular commode using grab bars and RW. Pivoted back to wc with close supervision.Bathing completed wc level at the sink with min A. Worked on pt donning prosthetic sleeve with pt able to complete this am without OT assist. Assistance to advance pants over hips in staninding 2/2 body habitus and tighter fitting pants. Pt left seated in wc at end of session at the sink finishing grooming tasks at wc level.   Therapy Documentation Precautions:  Precautions Precautions: Fall Restrictions Weight Bearing Restrictions: Yes RLE Weight Bearing: Weight bearing as tolerated LLE Weight Bearing: Non weight bearing   Other Treatments:     Therapy/Group: Individual Therapy  Valma Cava 04/26/2018, 10:28 AM

## 2018-04-26 NOTE — Progress Notes (Signed)
Social Work Patient ID: [name] [name], female   DOB: [birth year], 42 y.o.   MRN: [id number]  Have reviewed team conference with pt who is aware team continues to plan for d/c 12/10 with mod ind w/c level goals.  Pt c/o continued pain and weakness "... 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  Explained that PT has set for mod ind w/c goals, therefore she should plan her activities that require assistance for times that family available.  Will continue to follow.  Taetum Flewellen, LCSW

## 2018-04-26 NOTE — Progress Notes (Signed)
Lovenox dose adjustment:  Pt is on lovenox 0.5mg /kg qday due to obesity. She was previously on 80mg  qday but her wt has changed to around 152kg. We will reduce the dose to 75mg  SQ q24.  Onnie Boer, PharmD, BCIDP, AAHIVP, CPP Infectious Disease Pharmacist 04/26/2018 8:48 AM

## 2018-04-26 NOTE — Progress Notes (Signed)
Physical Therapy Session Note  Patient Details  Name: [name] [name] MRN: [id number] Date of Birth: April 17, 1976  Today's Date: 04/26/2018 PT Individual Time: 1030-1200 and 1300-1345 PT Individual Time Calculation (min): 90 min and 45 min   Short Term Goals: Week 2:  PT Short Term Goal 1 (Week 2): pt will ambulate x 10' with prosthesis and min A for bathroom mobility at home PT Short Term Goal 2 (Week 2): pt will perform stand pivot transfers wtih min A  Skilled Therapeutic Interventions/Progress Updates:    pt participated in skilled community outing with focus on w/c mobility, activity tolerance, energy conservation and problem solving in the community.  Pt propels w/c in community settings with supervision, min A for inclines and thresholds.  Community bathroom transfers with supervision with pt able to perform safe set up and w/c parts management.  See shadow chart for outing goal sheets and documentation.  Session 2: Pt c/o Lt knee pain throughout session.  Heat applied after session. Pt performs all w/c mobility with supervision.  Pt performs transfers throughout session with supervision.  Sit <>stand training with RW 3 x 3 with focus on strengthening and activity tolerance, all with CGA.  Gait training with prosthetic with prosthetist present. Pt performs gait 20', 15' with RW with supervision.  Pt left in room with RN present.  Therapy Documentation Precautions:  Precautions Precautions: Fall Restrictions Weight Bearing Restrictions: Yes RLE Weight Bearing: Weight bearing as tolerated LLE Weight Bearing: Non weight bearing Pain: Pt with no c/o pain   Therapy/Group: Individual Therapy  Katherine Syme 04/26/2018, 12:58 PM

## 2018-04-27 ENCOUNTER — Inpatient Hospital Stay (HOSPITAL_COMMUNITY): Payer: Self-pay | Admitting: Physical Therapy

## 2018-04-27 ENCOUNTER — Inpatient Hospital Stay (HOSPITAL_COMMUNITY): Payer: Self-pay | Admitting: Occupational Therapy

## 2018-04-27 LAB — CBC
HCT: 25.9 % — ABNORMAL LOW (ref 36.0–46.0)
HEMOGLOBIN: 7.4 g/dL — AB (ref 12.0–15.0)
MCH: 24.3 pg — ABNORMAL LOW (ref 26.0–34.0)
MCHC: 28.6 g/dL — ABNORMAL LOW (ref 30.0–36.0)
MCV: 85.2 fL (ref 80.0–100.0)
PLATELETS: 364 10*3/uL (ref 150–400)
RBC: 3.04 MIL/uL — AB (ref 3.87–5.11)
RDW: 15.9 % — AB (ref 11.5–15.5)
WBC: 4.9 10*3/uL (ref 4.0–10.5)
nRBC: 0 % (ref 0.0–0.2)

## 2018-04-27 LAB — BASIC METABOLIC PANEL
Anion gap: 8 (ref 5–15)
BUN: 39 mg/dL — ABNORMAL HIGH (ref 6–20)
CO2: 26 mmol/L (ref 22–32)
Calcium: 8.9 mg/dL (ref 8.9–10.3)
Chloride: 103 mmol/L (ref 98–111)
Creatinine, Ser: 1.35 mg/dL — ABNORMAL HIGH (ref 0.44–1.00)
GFR calc Af Amer: 56 mL/min — ABNORMAL LOW (ref >60)
GFR, EST NON AFRICAN AMERICAN: 49 mL/min — AB (ref >60)
Glucose, Bld: 120 mg/dL — ABNORMAL HIGH (ref 70–99)
Potassium: 4.2 mmol/L (ref 3.5–5.1)
Sodium: 137 mmol/L (ref 135–145)

## 2018-04-27 LAB — GLUCOSE, CAPILLARY
Glucose-Capillary: 99 mg/dL (ref 70–99)
Glucose-Capillary: 101 mg/dL — ABNORMAL HIGH (ref 70–99)
Glucose-Capillary: 89 mg/dL (ref 70–99)
Glucose-Capillary: 130 mg/dL — ABNORMAL HIGH (ref 70–99)

## 2018-04-27 NOTE — Progress Notes (Signed)
Physical Therapy Session Note  Patient Details  Name: [name] [name] MRN: [id number] Date of Birth: June 01, 1975  Today's Date: 04/27/2018 PT Individual Time: 1300-1410 PT Individual Time Calculation (min): 70 min   Short Term Goals: Week 2:  PT Short Term Goal 1 (Week 2): pt will ambulate x 10' with prosthesis and min A for bathroom mobility at home PT Short Term Goal 2 (Week 2): pt will perform stand pivot transfers wtih min A  Skilled Therapeutic Interventions/Progress Updates:    pt in w/c, prosthesis donned. Pt states her prosthesis feels "great".  Pt performs gait with RW and prosthesis with supervision 110', 50'.  Pt performs stand pivot transfers with RW with CGA throughout session.  Standing tolerance with horseshoe toss with alternating UEs 3 x 5 minutes.  Pt's prothesis with increased toe in after gait training.  Pt able to doff prosthesis, add sock and don prothesis with set up assist. Discussed needing to have socks at all times due to volume changes, pt verbalizes understanding. Pt provided with a walker bag to keep socks in.  Pt performs w/c mobility 50' x 2 with supervision.  Pt left in room with needs at hand.  Therapy Documentation Precautions:  Precautions Precautions: Fall Restrictions Weight Bearing Restrictions: Yes RLE Weight Bearing: Weight bearing as tolerated LLE Weight Bearing: Non weight bearing Pain: No c/o pain   Therapy/Group: Individual Therapy  Cristino Degroff 04/27/2018, 2:11 PM

## 2018-04-27 NOTE — Progress Notes (Signed)
[place] PHYSICAL MEDICINE & REHABILITATION PROGRESS NOTE   Subjective/Complaints: In good spirits. Outing went well. Happy about her diet  ROS: Patient denies fever, rash, sore throat, blurred vision, nausea, vomiting, diarrhea, cough, shortness of breath or chest pain, joint or back pain, headache, or mood change.   Objective:   No results found. Recent Labs    04/25/18 0444 04/27/18 0332  WBC 5.1 4.9  HGB 7.0* 7.4*  HCT 23.4* 25.9*  PLT 306 364   Recent Labs    04/25/18 0444 04/27/18 0332  NA 131* 137  K 4.4 4.2  CL 98 103  CO2 24 26  GLUCOSE 125* 120*  BUN 43* 39*  CREATININE 1.60* 1.35*  CALCIUM 8.6* 8.9    Intake/Output Summary (Last 24 hours) at 04/27/2018 0959 Last data filed at 04/27/2018 0907 Gross per 24 hour  Intake 850 ml  Output -  Net 850 ml     Physical Exam: Vital Signs Blood pressure (!) 180/94, pulse 99, temperature 98.3 F (36.8 C), resp. rate 16, height 5\' 10"  (1.778 m), weight (!) 153.3 kg, SpO2 91 %. Constitutional: No distress . Vital signs reviewed. HEENT: EOMI, oral membranes moist Neck: supple Cardiovascular: RRR without murmur. No JVD    Respiratory: CTA Bilaterally without wheezes or rales. Normal effort    GI: BS +, non-tender, non-distended  Musculoskeletal: continued edema bilateral LE, 1+ without change Neurological: She alert and oriented  Motor:   LLE: HF 4/5, moving left leg a bit more freely RLE: 4+/5 prox to distal with pain inhibition.  Skin:   Left stump intact, dry skin Right foot with chronic wound-stable Psychiatric:pleasant   Assessment/Plan: 1. Functional deficits secondary to LLE myositis which require 3+ hours per day of interdisciplinary therapy in a comprehensive inpatient rehab setting.  Physiatrist is providing close team supervision and 24 hour management of active medical problems listed below.  Physiatrist and rehab team continue to assess barriers to discharge/monitor patient progress toward  functional and medical goals  Care Tool:  Bathing    Body parts bathed by patient: Left arm, Right arm, Chest, Abdomen, Front perineal area, Face, Right upper leg, Left upper leg, Right lower leg, Buttocks   Body parts bathed by helper: Right lower leg Body parts n/a: Left lower leg   Bathing assist Assist Level: Supervision/Verbal cueing     Upper Body Dressing/Undressing Upper body dressing   What is the patient wearing?: Pull over shirt    Upper body assist Assist Level: Set up assist    Lower Body Dressing/Undressing Lower body dressing      What is the patient wearing?: Pants     Lower body assist Assist for lower body dressing: Maximal Assistance - Patient 25 - 49%(d/t time constraints)     Toileting Toileting    Toileting assist Assist for toileting: Minimal Assistance - Patient > 75%     Transfers Chair/bed transfer  Transfers assist     Chair/bed transfer assist level: Moderate Assistance - Patient 50 - 74%     Locomotion Ambulation   Ambulation assist   Ambulation activity did not occur: Safety/medical concerns  Assist level: Minimal Assistance - Patient > 75% Assistive device: Walker-rolling Max distance: 10   Walk 10 feet activity   Assist     Assist level: Minimal Assistance - Patient > 75% Assistive device: Walker-rolling, Prothesis   Walk 50 feet activity   Assist           Walk 150 feet activity  Assist           Walk 10 feet on uneven surface  activity   Assist           Wheelchair     Assist Will patient use wheelchair at discharge?: Yes Type of Wheelchair: Manual    Wheelchair assist level: Supervision/Verbal cueing Max wheelchair distance: 120    Wheelchair 50 feet with 2 turns activity    Assist    Wheelchair 50 feet with 2 turns activity did not occur: Safety/medical concerns(Per report )   Assist Level: Supervision/Verbal cueing   Wheelchair 150 feet activity     Assist  Wheelchair 150 feet activity did not occur: Safety/medical concerns(Per report )   Assist Level: Supervision/Verbal cueing     Medical Problem List and Plan: 1.Functional and mobility deficitssecondary to left lower extremity myositis/strep bacteremia -progressing toward goals 2. DVT Prophylaxis/Anticoagulation: Pharmaceutical:Lovenox 3.Chronic back pain/Management: Continue oxycodone as needed,  increased to 2 tablets on 11/27   baclofen 10mg  TID    -also discussed use of heat, distraction.   Naproxen on 11/27, d/ced on 11/29 4. Mood:LCSW to follow for evaluation and support. Egosupport to be provided by team 5. Neuropsych: This patientiscapable of making decisions on herown behalf. 6. Skin/Wound Care:Routine pressure relief measures.    -continue shifting, boosting  -ACE wrap LE's for edema control 7. Fluids/Electrolytes/Nutrition:Monitor I's and O's.  -on reg diet, discussed making appropriate food choices 8.Group B strep agalactiaebacteremia: Continue IV Ceftriaxone completed on 11/28 9.T2DM with neuropathy:  blood sugars AC at bedtime.     Lantus changed to 5 units twice daily on 11/28 evening  -sugars under control 12/6 10.Chronic constipation: BM 11/19.  Will continue to augment back bowel program 11.HTN: Monitor blood pressures twice daily.   Bumex held given hyponatremia   under control 12/6 12.Peripheral edema: Monitor weights daily. -compression/elevation of LE's  -weights holding in the 153kg range  13.Bipolar disorder with depression: Continue Prozac 14.Acute on  Chronic renal failure:   Creatinine down to 1.35 12/6---probably close to baseline  Encourage fluids   -sodium back up to 137 12/6 15.Anemia of chronic disease with iron deficiency anemia:   iron supplement.   Hemoglobin 7.4 12/6  - has had occasional heme + stool, no gross blood or signs of bleeding  Still normocytic (low end)  -consider GI  consult vs H/O  - iron panel reviewed and consistent with above  =recheck cbc next week 16. Morbid obesiy-BMI-48.6: Educate patient on appropriate diet and importance of activity to help promote health and mobility. 17.  Hyperkalemia  Potassium 4.4 on 12/4  LOS: 17 days A FACE TO [place] 04/27/2018, 9:59 AM

## 2018-04-27 NOTE — Plan of Care (Signed)
  Problem: Consults Goal: RH GENERAL PATIENT EDUCATION Description See Patient Education module for education specifics. Outcome: Progressing   Problem: RH BOWEL ELIMINATION Goal: RH STG MANAGE BOWEL W/MEDICATION W/ASSISTANCE Description STG Manage Bowel with Medication with mod  Assistance.  Outcome: Progressing   Problem: RH BLADDER ELIMINATION Goal: RH STG MANAGE BLADDER WITH ASSISTANCE Description STG Manage Bladder With min Assistance  Outcome: Progressing Goal: RH STG MANAGE BLADDER WITH EQUIPMENT WITH ASSISTANCE Description STG Manage Bladder With Equipment With min Assistance  Outcome: Progressing   Problem: RH SKIN INTEGRITY Goal: RH STG SKIN FREE OF INFECTION/BREAKDOWN Description Monitor skin every shift with min assistance  Outcome: Progressing Goal: RH STG MAINTAIN SKIN INTEGRITY WITH ASSISTANCE Description STG Maintain Skin Integrity With min Assistance.  Outcome: Progressing Goal: RH STG ABLE TO PERFORM INCISION/WOUND CARE W/ASSISTANCE Description STG Able To Perform Incision/Wound Care With mod Assistance.  Outcome: Progressing   Problem: RH SAFETY Goal: RH STG ADHERE TO SAFETY PRECAUTIONS W/ASSISTANCE/DEVICE Description STG Adhere to Safety Precautions With min Assistance/Device.  Outcome: Progressing   Problem: RH PAIN MANAGEMENT Goal: RH STG PAIN MANAGED AT OR BELOW PT'S PAIN GOAL Description Pt will report pain less than 2 with min assistance  Outcome: Progressing   Problem: RH KNOWLEDGE DEFICIT GENERAL Goal: RH STG INCREASE KNOWLEDGE OF SELF CARE AFTER HOSPITALIZATION Description Pt will be able to verbalize importance of ABT therapy and to monitor skin after discharge with min assistance  Outcome: Progressing

## 2018-04-27 NOTE — Progress Notes (Signed)
Occupational Therapy Session Note  Patient Details  Name: MERCEDEES CONVERY MRN: [id number] Date of Birth: 1975-11-13  Today's Date: 04/27/2018  Session 1 OT Individual Time: 0830-0900 OT Individual Time Calculation (min): 30 min   Session 2 OT Individual Time: 1415-1530 OT Individual Time Calculation (min): 75 min   Skilled Therapeutic Interventions/Progress Updates:    Session 1 Pt greeted seated in wc and agreeable to OT treatment session. Pt requests for light wash up and to don prosthesis. Pt propelled wc to the sink and completed UB bathing from wc. Gown donned in wc for time management. Pt cleaned prosthetic sleeve then worked on donning sleeve with set-up A.   Session 2 Pt greeted seated in wc and agreeable to OT treatment session. Pt requests to shower this afternoon. Worked on donning prosthesis, which pt did in standing and needed OT assistance to set-up prosthesis after standing. Discussed trying to set-up prosthesis prior to standing, then weight bearing to lock prosthesis in place after standing. Will try this next session. Pt ambulated to bathroom with CGA and heavy duty RW. Pt sat on edge of shower bench and needed assistance to remove prosthesis despite multiple attempts. Bathing completed from bench with set-up A. Talked about logistics of bathing at home as pt will likely need to don prosthesis after shower to ambulate back out of bathroom. Discussed waiting for [facility] for first shower to ensure safety with functional transfers in home environment. Stand-pivot out of shower without prosthesis with set-up A and close supervision. Pt able to don prosthestic sleeve from wc level, donned new gown, and left seated in wc with needs met.   Therapy Documentation Precautions:  Precautions Precautions: Fall Restrictions Weight Bearing Restrictions: Yes RLE Weight Bearing: Weight bearing as tolerated LLE Weight Bearing: Non weight bearing  Pain: Pain Assessment Pain Scale:  0-10 Pain Score: 6  Pain Type: Acute pain Pain Location: Leg(knee) Pain Orientation: Left Pain Descriptors / Indicators: Aching;Constant Pain Frequency: Constant Pain Onset: On-going Patients Stated Pain Goal: 3 Pain Intervention(s): Repositioned Multiple Pain Sites: No   Therapy/Group: Individual Therapy  Valma Cava 04/27/2018, 3:36 PM

## 2018-04-28 DIAGNOSIS — R072 Precordial pain: Secondary | ICD-10-CM

## 2018-04-28 LAB — GLUCOSE, CAPILLARY
GLUCOSE-CAPILLARY: 150 mg/dL — AB (ref 70–99)
Glucose-Capillary: 102 mg/dL — ABNORMAL HIGH (ref 70–99)
Glucose-Capillary: 64 mg/dL — ABNORMAL LOW (ref 70–99)
Glucose-Capillary: 92 mg/dL (ref 70–99)
Glucose-Capillary: 100 mg/dL — ABNORMAL HIGH (ref 70–99)

## 2018-04-28 LAB — TROPONIN I: Troponin I: <0.03 ng/mL (ref <0.03)

## 2018-04-28 MED ORDER — INSULIN GLARGINE 100 UNIT/ML ~~LOC~~ SOLN
3.0000 [IU] | Freq: Two times a day (BID) | SUBCUTANEOUS | Status: DC
  Administered 2018-04-28 – 2018-05-01 (×7): 3 [IU] via SUBCUTANEOUS
  Filled 2018-04-28 (×8): qty 0.03

## 2018-04-28 NOTE — Significant Event (Addendum)
Hypoglycemic Event  CBG: 64  Treatment: 4 oz juice/soda  Symptoms: None  Follow-up CBG: Time:0732 CBG Result:102  Possible Reasons for Event: Unknown  Comments/MD notified: yes  MD notified@[id number]    [name] [name]

## 2018-04-28 NOTE — Plan of Care (Signed)
  Problem: Consults Goal: RH GENERAL PATIENT EDUCATION Description See Patient Education module for education specifics. Outcome: Progressing   Problem: RH BOWEL ELIMINATION Goal: RH STG MANAGE BOWEL W/MEDICATION W/ASSISTANCE Description STG Manage Bowel with Medication with mod  Assistance.  Outcome: Progressing   Problem: RH BLADDER ELIMINATION Goal: RH STG MANAGE BLADDER WITH ASSISTANCE Description STG Manage Bladder With min Assistance  Outcome: Progressing Goal: RH STG MANAGE BLADDER WITH EQUIPMENT WITH ASSISTANCE Description STG Manage Bladder With Equipment With min Assistance  Outcome: Progressing   Problem: RH SKIN INTEGRITY Goal: RH STG SKIN FREE OF INFECTION/BREAKDOWN Description Monitor skin every shift with min assistance  Outcome: Progressing Goal: RH STG MAINTAIN SKIN INTEGRITY WITH ASSISTANCE Description STG Maintain Skin Integrity With min Assistance.  Outcome: Progressing Goal: RH STG ABLE TO PERFORM INCISION/WOUND CARE W/ASSISTANCE Description STG Able To Perform Incision/Wound Care With mod Assistance.  Outcome: Progressing   Problem: RH SAFETY Goal: RH STG ADHERE TO SAFETY PRECAUTIONS W/ASSISTANCE/DEVICE Description STG Adhere to Safety Precautions With min Assistance/Device.  Outcome: Progressing   Problem: RH PAIN MANAGEMENT Goal: RH STG PAIN MANAGED AT OR BELOW PT'S PAIN GOAL Description Pt will report pain less than 2 with min assistance  Outcome: Progressing   Problem: RH KNOWLEDGE DEFICIT GENERAL Goal: RH STG INCREASE KNOWLEDGE OF SELF CARE AFTER HOSPITALIZATION Description Pt will be able to verbalize importance of ABT therapy and to monitor skin after discharge with min assistance  Outcome: Progressing

## 2018-04-28 NOTE — Progress Notes (Signed)
[place] PHYSICAL MEDICINE & REHABILITATION PROGRESS NOTE   Subjective/Complaints:  Patient states that she has some chest pain this morning.  She states that she has some shortness of breath currently but does not appear to be in any respiratory distress. Patient disappointed that she did not have any physical therapy today.  ROS: Patient denies fever, rash, sore throat, blurred vision, nausea, vomiting, diarrhea, cough, shortness of breath or chest pain, joint or back pain, headache, or mood change.   Objective:   No results found. Recent Labs    04/27/18 0332  WBC 4.9  HGB 7.4*  HCT 25.9*  PLT 364   Recent Labs    04/27/18 0332  NA 137  K 4.2  CL 103  CO2 26  GLUCOSE 120*  BUN 39*  CREATININE 1.35*  CALCIUM 8.9    Intake/Output Summary (Last 24 hours) at 04/28/2018 1309 Last data filed at 04/28/2018 1300 Gross per 24 hour  Intake 1422 ml  Output -  Net 1422 ml     Physical Exam: Vital Signs Blood pressure (!) 163/100, pulse 85, temperature 98 F (36.7 C), temperature source Oral, resp. rate 18, height 5\' 10"  (1.778 m), weight (!) 153.2 kg, SpO2 97 %. Constitutional: No distress . Vital signs reviewed. HEENT: EOMI, oral membranes moist Neck: supple Cardiovascular: RRR without murmur. No JVD    Respiratory: CTA Bilaterally without wheezes or rales. Normal effort   , no respiratory distress, no rales or rhonchi GI: BS +, non-tender, non-distended  Musculoskeletal: continued edema bilateral LE, 1+ without change, no tenderness to palpation in the parasternal area Neurological: She alert and oriented  Motor:   LLE: HF 4/5, moving left leg a bit more freely RLE: 4+/5 prox to distal with pain inhibition.  Skin:   Left stump intact, dry skin Right foot with chronic wound-stable Psychiatric:pleasant, calm   Assessment/Plan: 1. Functional deficits secondary to LLE myositis which require 3+ hours per day of interdisciplinary therapy in a comprehensive  inpatient rehab setting.  Physiatrist is providing close team supervision and 24 hour management of active medical problems listed below.  Physiatrist and rehab team continue to assess barriers to discharge/monitor patient progress toward functional and medical goals  Care Tool:  Bathing    Body parts bathed by patient: Left arm, Right arm, Chest, Abdomen, Front perineal area, Face, Right upper leg, Left upper leg, Right lower leg, Buttocks   Body parts bathed by helper: Right lower leg Body parts n/a: Left lower leg   Bathing assist Assist Level: Supervision/Verbal cueing     Upper Body Dressing/Undressing Upper body dressing   What is the patient wearing?: Pull over shirt    Upper body assist Assist Level: Set up assist    Lower Body Dressing/Undressing Lower body dressing      What is the patient wearing?: Pants     Lower body assist Assist for lower body dressing: Maximal Assistance - Patient 25 - 49%(d/t time constraints)     Toileting Toileting    Toileting assist Assist for toileting: Minimal Assistance - Patient > 75%     Transfers Chair/bed transfer  Transfers assist     Chair/bed transfer assist level: Moderate Assistance - Patient 50 - 74%     Locomotion Ambulation   Ambulation assist   Ambulation activity did not occur: Safety/medical concerns  Assist level: Minimal Assistance - Patient > 75% Assistive device: Walker-rolling Max distance: 10   Walk 10 feet activity   Assist  Assist level: Minimal Assistance - Patient > 75% Assistive device: Walker-rolling, Prothesis   Walk 50 feet activity   Assist           Walk 150 feet activity   Assist           Walk 10 feet on uneven surface  activity   Assist           Wheelchair     Assist Will patient use wheelchair at discharge?: Yes Type of Wheelchair: Manual    Wheelchair assist level: Supervision/Verbal cueing Max wheelchair distance: 120     Wheelchair 50 feet with 2 turns activity    Assist    Wheelchair 50 feet with 2 turns activity did not occur: Safety/medical concerns(Per report )   Assist Level: Supervision/Verbal cueing   Wheelchair 150 feet activity     Assist Wheelchair 150 feet activity did not occur: Safety/medical concerns(Per report )   Assist Level: Supervision/Verbal cueing     Medical Problem List and Plan: 1.Functional and mobility deficitssecondary to left lower extremity myositis/strep bacteremia -progressing toward goals 2. DVT Prophylaxis/Anticoagulation: Pharmaceutical:Lovenox 3.Chronic back pain/Management: Continue oxycodone as needed,  increased to 2 tablets on 11/27   baclofen 10mg  TID    -also discussed use of heat, distraction.   Naproxen on 11/27, d/ced on 11/29 4. Mood:LCSW to follow for evaluation and support. Egosupport to be provided by team 5. Neuropsych: This patientiscapable of making decisions on herown behalf. 6. Skin/Wound Care:Routine pressure relief measures.    -continue shifting, boosting  -ACE wrap LE's for edema control 7. Fluids/Electrolytes/Nutrition:Monitor I's and O's.  -on reg diet, discussed making appropriate food choices 8.Group B strep agalactiaebacteremia: Continue IV Ceftriaxone completed on 11/28 9.T2DM with neuropathy:  blood sugars AC at bedtime.     Lantus changed to 5 units twice daily on 11/28 evening  -sugars under control 12/6 10.Chronic constipation: BM 11/19.  Will continue to augment back bowel program 11.HTN: Monitor blood pressures twice daily.   Bumex held given hyponatremia   under control 12/6 12.Peripheral edema: Monitor weights daily. -compression/elevation of LE's  -weights holding in the 153kg range  13.Bipolar disorder with depression: Continue Prozac 14.Acute on  Chronic renal failure:   Creatinine down to 1.35 12/6---probably close to baseline  Encourage fluids    -sodium back up to 137 12/6 15.Anemia of chronic disease with iron deficiency anemia:   iron supplement.   Hemoglobin 7.4 12/6  - has had occasional heme + stool, no gross blood or signs of bleeding  Still normocytic (low end)  -consider GI consult vs H/O  - iron panel reviewed and consistent with above  =recheck cbc next week 16. Morbid obesiy-BMI-48.6: Educate patient on appropriate diet and importance of activity to help promote health and mobility. 17.  Hyperkalemia  Potassium 4.4 on 12/4 18.  Chest pain, patient states it has been going on all day but has not mentioned it to anyone, she has some shortness of breath that started later but examination is normal.  No history of coronary artery disease, echo from 2018 demonstrated grade 2 diastolic dysfunction.  Last EKG April 03, 2018 normal except for sinus tachycardia We will repeat EKG, check troponin x1 LOS: 18 days A FACE TO [place] E Apollo Timothy 04/28/2018, 1:09 PM

## 2018-04-29 ENCOUNTER — Inpatient Hospital Stay (HOSPITAL_COMMUNITY): Payer: Self-pay | Admitting: Physical Therapy

## 2018-04-29 DIAGNOSIS — Z89512 Acquired absence of left leg below knee: Secondary | ICD-10-CM

## 2018-04-29 LAB — GLUCOSE, CAPILLARY
GLUCOSE-CAPILLARY: 80 mg/dL (ref 70–99)
Glucose-Capillary: 87 mg/dL (ref 70–99)
Glucose-Capillary: 129 mg/dL — ABNORMAL HIGH (ref 70–99)
Glucose-Capillary: 136 mg/dL — ABNORMAL HIGH (ref 70–99)

## 2018-04-29 MED ORDER — SIMETHICONE 40 MG/0.6ML PO SUSP
80.0000 mg | Freq: Four times a day (QID) | ORAL | Status: DC
  Administered 2018-04-29 – 2018-05-01 (×9): 80 mg via ORAL
  Filled 2018-04-29 (×12): qty 1.2

## 2018-04-29 MED ORDER — AMLODIPINE BESYLATE 10 MG PO TABS
10.0000 mg | ORAL_TABLET | Freq: Every day | ORAL | Status: DC
  Administered 2018-04-30 – 2018-05-01 (×2): 10 mg via ORAL
  Filled 2018-04-29 (×2): qty 1

## 2018-04-29 NOTE — Progress Notes (Signed)
Physical Therapy Session Note  Patient Details  Name: MELODY CIRRINCIONE MRN: [id number] Date of Birth: 23-Nov-1975  Today's Date: 04/29/2018 PT Individual Time: 1030-1100 PT Individual Time Calculation (min): 30 min   Short Term Goals: Week 2:  PT Short Term Goal 1 (Week 2): pt will ambulate x 10' with prosthesis and min A for bathroom mobility at home PT Short Term Goal 2 (Week 2): pt will perform stand pivot transfers wtih min A  Skilled Therapeutic Interventions/Progress Updates:    pt dons liner and prosthetic, difficulty getting prosthetic fitted.  Problem solving with pt and readjusted liner, able to don prosthetic in standing.  Pt performs gait x 150' with supervision. Pt more confident in ability to problem solve prosthetic issues.  Therapy Documentation Precautions:  Precautions Precautions: Fall Restrictions Weight Bearing Restrictions: Yes RLE Weight Bearing: Weight bearing as tolerated LLE Weight Bearing: Non weight bearing Pain:  pt continues with Lt knee pain, lidocaine patch donned prior to session, rests as needed during session   Therapy/Group: Individual Therapy  DONAWERTH,KAREN 04/29/2018, 12:32 PM

## 2018-04-29 NOTE — Progress Notes (Signed)
[facility] PHYSICAL MEDICINE & REHABILITATION PROGRESS NOTE   Subjective/Complaints:  No issues overnight still with some epigastric/chest pain discussed normal EKG and troponin.  She feels like this may be a gas pain but does not feel like it is a heartburn issue ROS: Patient denies fever, rash, sore throat, blurred vision, nausea, vomiting, diarrhea, cough, shortness of breath or chest pain, joint or back pain, headache, or mood change.   Objective:   No results found. Recent Labs    04/27/18 0332  WBC 4.9  HGB 7.4*  HCT 25.9*  PLT 364   Recent Labs    04/27/18 0332  NA 137  K 4.2  CL 103  CO2 26  GLUCOSE 120*  BUN 39*  CREATININE 1.35*  CALCIUM 8.9    Intake/Output Summary (Last 24 hours) at 04/29/2018 1050 Last data filed at 04/29/2018 0800 Gross per 24 hour  Intake 684 ml  Output -  Net 684 ml     Physical Exam: Vital Signs Blood pressure (!) 154/95, pulse 91, temperature 98.1 F (36.7 C), temperature source Oral, resp. rate 18, height 5\' 10"  (1.778 m), weight (!) 153.5 kg, SpO2 98 %. Constitutional: No distress . Vital signs reviewed. HEENT: EOMI, oral membranes moist Neck: supple Cardiovascular: RRR without murmur. No JVD    Respiratory: CTA Bilaterally without wheezes or rales. Normal effort   , no respiratory distress, no rales or rhonchi GI: BS +, non-tender, non-distended  Musculoskeletal: continued edema bilateral LE, 1+ without change, no tenderness to palpation in the parasternal area Neurological: She alert and oriented  Motor:   LLE: HF 4/5, moving left leg a bit more freely RLE: 4+/5 prox to distal with pain inhibition.  Skin:   Left stump intact, dry skin Right foot with chronic wound-stable Psychiatric:pleasant, calm   Assessment/Plan: 1. Functional deficits secondary to LLE myositis which require 3+ hours per day of interdisciplinary therapy in a comprehensive inpatient rehab setting.  Physiatrist is providing close team supervision  and 24 hour management of active medical problems listed below.  Physiatrist and rehab team continue to assess barriers to discharge/monitor patient progress toward functional and medical goals  Care Tool:  Bathing    Body parts bathed by patient: Left arm, Right arm, Chest, Abdomen, Front perineal area, Face, Right upper leg, Left upper leg, Right lower leg, Buttocks   Body parts bathed by helper: Right lower leg Body parts n/a: Left lower leg   Bathing assist Assist Level: Supervision/Verbal cueing     Upper Body Dressing/Undressing Upper body dressing   What is the patient wearing?: Pull over shirt    Upper body assist Assist Level: Set up assist    Lower Body Dressing/Undressing Lower body dressing      What is the patient wearing?: Pants     Lower body assist Assist for lower body dressing: Maximal Assistance - Patient 25 - 49%(d/t time constraints)     Toileting Toileting    Toileting assist Assist for toileting: Minimal Assistance - Patient > 75%     Transfers Chair/bed transfer  Transfers assist     Chair/bed transfer assist level: Moderate Assistance - Patient 50 - 74%     Locomotion Ambulation   Ambulation assist   Ambulation activity did not occur: Safety/medical concerns  Assist level: Minimal Assistance - Patient > 75% Assistive device: Walker-rolling Max distance: 10   Walk 10 feet activity   Assist     Assist level: Minimal Assistance - Patient > 75% Assistive device:  Walker-rolling, Prothesis   Walk 50 feet activity   Assist           Walk 150 feet activity   Assist           Walk 10 feet on uneven surface  activity   Assist           Wheelchair     Assist Will patient use wheelchair at discharge?: Yes Type of Wheelchair: Manual    Wheelchair assist level: Supervision/Verbal cueing Max wheelchair distance: 120    Wheelchair 50 feet with 2 turns activity    Assist    Wheelchair 50 feet  with 2 turns activity did not occur: Safety/medical concerns(Per report )   Assist Level: Supervision/Verbal cueing   Wheelchair 150 feet activity     Assist Wheelchair 150 feet activity did not occur: Safety/medical concerns(Per report )   Assist Level: Supervision/Verbal cueing     Medical Problem List and Plan: 1.Functional and mobility deficitssecondary to left lower extremity myositis/strep bacteremia -progressing toward goals 2. DVT Prophylaxis/Anticoagulation: Pharmaceutical:Lovenox 3.Chronic back pain/Management: Continue oxycodone as needed,  increased to 2 tablets on 11/27   baclofen 10mg  TID    -also discussed use of heat, distraction.   Naproxen on 11/27, d/ced on 11/29 4. Mood:LCSW to follow for evaluation and support. Egosupport to be provided by team 5. Neuropsych: This patientiscapable of making decisions on herown behalf. 6. Skin/Wound Care:Routine pressure relief measures.    -continue shifting, boosting  -ACE wrap LE's for edema control 7. Fluids/Electrolytes/Nutrition:Monitor I's and O's.  -on reg diet, discussed making appropriate food choices 8.Group B strep agalactiaebacteremia: Continue IV Ceftriaxone completed on 11/28 9.T2DM with neuropathy:  blood sugars AC at bedtime.     Lantus changed to 5 units twice daily on 11/28 evening  -sugars under control 12/6 10.Chronic constipation: BM 11/19.  Will continue to augment back bowel program 11.HTN: Monitor blood pressures twice daily.   Bumex held given hyponatremia   Vitals:   04/28/18 2053 04/29/18 0423  BP: (!) 153/100 (!) 154/95  Pulse: 86 91  Resp: 18 18  Temp: 98 F (36.7 C) 98.1 F (36.7 C)  SpO2: 100% 98%  Blood pressure elevated, increase amlodipine to 10 mg given that diuretic has been held 12.Peripheral edema: Monitor weights daily. -compression/elevation of LE's  -weights holding in the 153kg range  13.Bipolar disorder with  depression: Continue Prozac 14.Acute on  Chronic renal failure:   Creatinine down to 1.35 12/6---probably close to baseline  Encourage fluids   -sodium back up to 137 12/6 15.Anemia of chronic disease with iron deficiency anemia:   iron supplement.   Hemoglobin 7.4 12/6  - has had occasional heme + stool, no gross blood or signs of bleeding  Still normocytic (low end)  -consider GI consult vs H/O  - iron panel reviewed and consistent with above  =recheck cbc next week 16. Morbid obesiy-BMI-48.6: Educate patient on appropriate diet and importance of activity to help promote health and mobility. 17.  Hyperkalemia  Potassium 4.4 on 12/4 18.  Chest pain, not appear cardiac.  No history of coronary artery disease, echo from 2018 demonstrated grade 2 diastolic dysfunction.  Last EKG April 03, 2018 normal except for sinus tachycardia Normal 04/28/2018 EKG, as well as troponin x1 We will try simethicone for gas LOS: 19 days A FACE TO [place] E  04/29/2018, 10:50 AM

## 2018-04-30 ENCOUNTER — Inpatient Hospital Stay (HOSPITAL_COMMUNITY): Payer: Self-pay | Admitting: Physical Therapy

## 2018-04-30 ENCOUNTER — Inpatient Hospital Stay (HOSPITAL_COMMUNITY): Payer: Self-pay | Admitting: Occupational Therapy

## 2018-04-30 ENCOUNTER — Inpatient Hospital Stay (HOSPITAL_COMMUNITY): Payer: Self-pay

## 2018-04-30 LAB — BASIC METABOLIC PANEL
Anion gap: 5 (ref 5–15)
BUN: 31 mg/dL — ABNORMAL HIGH (ref 6–20)
CO2: 27 mmol/L (ref 22–32)
Calcium: 8.6 mg/dL — ABNORMAL LOW (ref 8.9–10.3)
Chloride: 103 mmol/L (ref 98–111)
Creatinine, Ser: 1.05 mg/dL — ABNORMAL HIGH (ref 0.44–1.00)
GFR calc Af Amer: >60 mL/min (ref >60)
GFR calc non Af Amer: >60 mL/min (ref >60)
Glucose, Bld: 109 mg/dL — ABNORMAL HIGH (ref 70–99)
Potassium: 4.7 mmol/L (ref 3.5–5.1)
Sodium: 135 mmol/L (ref 135–145)

## 2018-04-30 LAB — CBC
HCT: 25.2 % — ABNORMAL LOW (ref 36.0–46.0)
Hemoglobin: 7.1 g/dL — ABNORMAL LOW (ref 12.0–15.0)
MCH: 24.1 pg — ABNORMAL LOW (ref 26.0–34.0)
MCHC: 28.2 g/dL — ABNORMAL LOW (ref 30.0–36.0)
MCV: 85.7 fL (ref 80.0–100.0)
NRBC: 0 % (ref 0.0–0.2)
Platelets: 396 10*3/uL (ref 150–400)
RBC: 2.94 MIL/uL — ABNORMAL LOW (ref 3.87–5.11)
RDW: 15.5 % (ref 11.5–15.5)
WBC: 4.5 10*3/uL (ref 4.0–10.5)

## 2018-04-30 LAB — GLUCOSE, CAPILLARY
GLUCOSE-CAPILLARY: 103 mg/dL — AB (ref 70–99)
Glucose-Capillary: 81 mg/dL (ref 70–99)
Glucose-Capillary: 84 mg/dL (ref 70–99)
Glucose-Capillary: 138 mg/dL — ABNORMAL HIGH (ref 70–99)

## 2018-04-30 MED ORDER — OXYCODONE-ACETAMINOPHEN 5-325 MG PO TABS
1.0000 | ORAL_TABLET | Freq: Four times a day (QID) | ORAL | Status: DC | PRN
  Administered 2018-04-30: 2 via ORAL

## 2018-04-30 NOTE — Progress Notes (Signed)
Occupational Therapy Discharge Summary  Patient Details  Name: [name] [name] MRN: [id number] Date of Birth: 05/23/1975  Patient has met 10 of 10 long term goals due to improved activity tolerance, improved balance, postural control and ability to compensate for deficits.  Patient to discharge at overall Supervision level.  Patient's care partner is independent to provide the necessary physical assistance at discharge for higher level iADL tasks.  Reasons goals not met: n/a  Recommendation:  Patient will benefit from ongoing skilled OT services in home health setting to continue to advance functional skills in the area of BADL and Reduce care partner burden.  Equipment: heavy duty RW, heavy duty tub transfer bench  Reasons for discharge: treatment goals met and discharge from hospital  Patient/family agrees with progress made and goals achieved: Yes  OT Discharge Precautions/Restrictions  Precautions Precautions: Fall Restrictions Weight Bearing Restrictions: No RLE Weight Bearing: Weight bearing as tolerated LLE Weight Bearing: Non weight bearing Pain Pain Assessment Pain Scale: 0-10 Pain Score: 5  Faces Pain Scale: Hurts little more Pain Type: Chronic pain Pain Location: Knee Pain Orientation: Left Pain Descriptors / Indicators: Aching;Discomfort Pain Frequency: Constant Pain Onset: On-going Patients Stated Pain Goal: 1 Pain Intervention(s): Repositioned ADL ADL Equipment Provided: Long-handled sponge, Reacher Eating: Independent Grooming: Independent Upper Body Bathing: Independent Lower Body Bathing: Setup Where Assessed-Lower Body Bathing: Shower Upper Body Dressing: Independent Where Assessed-Upper Body Dressing: Wheelchair Lower Body Dressing: Setup, Supervision/safety Toileting: Independent Toilet Transfer: Distant supervision Toilet Transfer Method: Ambulating Tub/Shower Transfer: Distant supervision Tub/Shower Transfer Method:  Ambulating Tub/Shower Equipment: Transfer tub bench Vision Eye Alignment: Within Functional Limits Perception  Perception: Within Functional Limits Praxis Praxis: Intact Cognition Overall Cognitive Status: Within Functional Limits for tasks assessed Arousal/Alertness: Awake/alert Orientation Level: Oriented X4 Selective Attention: Appears intact Memory: Appears intact Awareness: Appears intact Problem Solving: Appears intact Safety/Judgment: Appears intact Sensation Sensation Light Touch: Impaired Detail Peripheral sensation comments: absent anterior lower leg, lateral foot Hot/Cold: Appears Intact Proprioception: Appears Intact Stereognosis: Appears Intact Coordination Gross Motor Movements are Fluid and Coordinated: Yes Fine Motor Movements are Fluid and Coordinated: Yes Motor  Motor Motor - Discharge Observations: improved strength since eval Mobility  Bed Mobility Rolling Right: Independent Rolling Left: Independent Right Sidelying to Sit: Independent Transfers Sit to Stand: Supervision/Verbal cueing  Trunk/Postural Assessment  Cervical Assessment Cervical Assessment: Within Functional Limits Thoracic Assessment Thoracic Assessment: Within Functional Limits Lumbar Assessment Lumbar Assessment: Within Functional Limits Postural Control Postural Control: Within Functional Limits  Balance Balance Balance Assessed: Yes Static Sitting Balance Static Sitting - Level of Assistance: 7: Independent Dynamic Sitting Balance Dynamic Sitting - Level of Assistance: 7: Independent Static Standing Balance Static Standing - Level of Assistance: 5: Stand by assistance Extremity/Trunk Assessment RUE Assessment RUE Assessment: Within Functional Limits LUE Assessment LUE Assessment: Within Functional Limits    S  04/30/2018, 12:45 PM 

## 2018-04-30 NOTE — Progress Notes (Signed)
Occupational Therapy Session Note  Patient Details  Name: [name] [name] MRN: [id number] Date of Birth: 11-10-1975  Today's Date: 04/30/2018 OT Individual Time: 1300-1415 OT Individual Time Calculation (min): 75 min    Short Term Goals: Week 3:  OT Short Term Goal 1 (Week 3): LTG=STG 2/2 ELOS  Skilled Therapeutic Interventions/Progress Updates:    Session focused on functional mobility and dynamic standing balance. Pt completed 100 ft of functional mobility with RW at (S). Pt demonstrated good safety awareness and reported when needing a seated rest break without cueing. Increased time required during mobility. Pt required a 3rd liner for her L prosthetic, doffing prosthetic, donning liner and re-donning prosthetic with CGA during standing. Pt played Wii bowling game, standing for 40+ min to swing controller with (S) only. Pt completed 50 ft of functional mobility back to room with (S). Pt was returned to room and left sitting up in w/c with all needs met.    Therapy Documentation Precautions:  Precautions Precautions: Fall Restrictions Weight Bearing Restrictions: No RLE Weight Bearing: Weight bearing as tolerated LLE Weight Bearing: Non weight bearing  Therapy Vitals Temp: 97.9 F (36.6 C) Pulse Rate: 86 Resp: 16 BP: (!) 141/86 Oxygen Therapy SpO2: 98 % Pain: Pain Assessment Pain Scale: 0-10 Pain Score: 8    Therapy/Group: Individual Therapy  Curtis Sites 04/30/2018, 2:58 PM

## 2018-04-30 NOTE — Progress Notes (Signed)
Physical Therapy Discharge Summary  Patient Details  Name: [name] [name] MRN: [id number] Date of Birth: [birth year]  Today's Date: 04/30/2018 PT Individual Time: 0825-0925 PT Individual Time Calculation (min): 60 min   Pt in w/c with prosthetic donned.  Pt performs gait 160' x 2 with RW with supervision.  Pt performs curb step negotiation to simulate home entry with RW.  Pt performs with min A to step up, supervision to step down, initial cues for sequencing, progressing to perform without cuing.  Pt performs simulated car transfer to sedan height car with RW and supervision.  Problem solved sitting in car with or without prosthetic, pt feels she will be able to don/doff prosthetic sitting in car.  Pt propels w/c x 200' with mod I back to room.  Educated pt on energy conservation, ideas for seated home exercise and safety at home. Pt states she feels safe and ready to d/c home tomorrow.  Patient has met 8 of 8 long term goals due to improved activity tolerance, improved balance, increased strength, decreased pain and ability to compensate for deficits.  Patient to discharge at a wheelchair level Modified Independent.     Reasons goals not met: n/a  Recommendation:  Patient will benefit from ongoing skilled PT services in home health setting to continue to advance safe functional mobility, address ongoing impairments in ROM, balance, strength, and minimize fall risk.  Equipment: RW  Reasons for discharge: treatment goals met and discharge from hospital  Patient/family agrees with progress made and goals achieved: Yes  PT Discharge Precautions/Restrictions Precautions Precautions: Fall Restrictions Weight Bearing Restrictions: No Pain Pain Assessment Pain Score: 2  Faces Pain Scale: Hurts little more Pain Type: Chronic pain Pain Location: Knee Pain Orientation: Left Pain Descriptors / Indicators: Aching Pain Onset: On-going Pain Intervention(s):  Repositioned;Ambulation/increased activity;Rest  Cognition Overall Cognitive Status: Within Functional Limits for tasks assessed Arousal/Alertness: Awake/alert Orientation Level: Oriented X4 Sensation Sensation Peripheral sensation comments: absent anterior lower leg, lateral foot Proprioception: Appears Intact Coordination Gross Motor Movements are Fluid and Coordinated: Yes Motor  Motor Motor - Discharge Observations: improving strength  Mobility Bed Mobility Rolling Right: Independent Rolling Left: Independent Right Sidelying to Sit: Independent Transfers Transfers: Sit to Stand;Stand Pivot Transfers Sit to Stand: Supervision/Verbal cueing Stand Pivot Transfers: Supervision/Verbal cueing Lateral/Scoot Transfers: Independent with assistive device Locomotion  Gait Ambulation: Yes Gait Assistance: Supervision/Verbal cueing Gait Distance (Feet): 150 Feet Assistive device: Rolling walker Stairs / Additional Locomotion Stairs: Yes Stairs Assistance: Minimal Assistance - Patient > 75% Stair Management Technique: With walker Number of Stairs: 2 Wheelchair Mobility Wheelchair Mobility: Yes Wheelchair Assistance: Independent with Camera operator: Both upper extremities Wheelchair Parts Management: Independent  Trunk/Postural Assessment  Cervical Assessment Cervical Assessment: Within Functional Limits Thoracic Assessment Thoracic Assessment: Within Functional Limits Lumbar Assessment Lumbar Assessment: Within Functional Limits Postural Control Postural Control: Within Functional Limits  Balance Static Sitting Balance Static Sitting - Level of Assistance: 7: Independent Dynamic Sitting Balance Dynamic Sitting - Level of Assistance: 7: Independent Static Standing Balance Static Standing - Level of Assistance: 5: Stand by assistance Extremity Assessment      RLE Assessment General Strength Comments: limited ROM by edema, grossly 3/5 LLE  Assessment General Strength Comments: grossly 4/5    Chattie Greeson 04/30/2018, 9:30 AM

## 2018-04-30 NOTE — Progress Notes (Signed)
[facility] PHYSICAL MEDICINE & REHABILITATION PROGRESS NOTE   Subjective/Complaints:  Overall in good spirits. Wearing left BK prosthesis, seems to be fitting well. Had questions about FR  ROS: Patient denies fever, rash, sore throat, blurred vision, nausea, vomiting, diarrhea, cough, shortness of breath or chest pain, , headache, or mood change.   Objective:   No results found. Recent Labs    04/30/18 0355  WBC 4.5  HGB 7.1*  HCT 25.2*  PLT 396   Recent Labs    04/30/18 0355  NA 135  K 4.7  CL 103  CO2 27  GLUCOSE 109*  BUN 31*  CREATININE 1.05*  CALCIUM 8.6*    Intake/Output Summary (Last 24 hours) at 04/30/2018 0900 Last data filed at 04/30/2018 0700 Gross per 24 hour  Intake 822 ml  Output -  Net 822 ml     Physical Exam: Vital Signs Blood pressure (!) 171/93, pulse 86, temperature 98.3 F (36.8 C), resp. rate 19, height 5\' 10"  (1.778 m), weight (!) 153.5 kg, SpO2 99 %. Constitutional: No distress . Vital signs reviewed. HEENT: EOMI, oral membranes moist Neck: supple Cardiovascular: RRR without murmur. No JVD    Respiratory: CTA Bilaterally without wheezes or rales. Normal effort    GI: BS +, non-tender, non-distended  Musculoskeletal: continued edema bilateral LE, 1+ without change. left BK pros fits.  Neurological: She alert and oriented  Motor:   LLE: HF 4/5, moving left leg a bit more freely RLE: 4+/5 prox to distal with pain inhibition.  Skin:   Left stump intact, dry skin Right foot with chronic wound-stable Psychiatric:pleasant  Assessment/Plan: 1. Functional deficits secondary to LLE myositis which require 3+ hours per day of interdisciplinary therapy in a comprehensive inpatient rehab setting.  Physiatrist is providing close team supervision and 24 hour management of active medical problems listed below.  Physiatrist and rehab team continue to assess barriers to discharge/monitor patient progress toward functional and medical goals  Care  Tool:  Bathing    Body parts bathed by patient: Left arm, Right arm, Chest, Abdomen, Front perineal area, Face, Right upper leg, Left upper leg, Right lower leg, Buttocks   Body parts bathed by helper: Right lower leg Body parts n/a: Left lower leg   Bathing assist Assist Level: Supervision/Verbal cueing     Upper Body Dressing/Undressing Upper body dressing   What is the patient wearing?: Pull over shirt    Upper body assist Assist Level: Set up assist    Lower Body Dressing/Undressing Lower body dressing      What is the patient wearing?: Pants     Lower body assist Assist for lower body dressing: Maximal Assistance - Patient 25 - 49%(d/t time constraints)     Toileting Toileting    Toileting assist Assist for toileting: Minimal Assistance - Patient > 75%     Transfers Chair/bed transfer  Transfers assist     Chair/bed transfer assist level: Moderate Assistance - Patient 50 - 74%     Locomotion Ambulation   Ambulation assist   Ambulation activity did not occur: Safety/medical concerns  Assist level: Minimal Assistance - Patient > 75% Assistive device: Walker-rolling Max distance: 10   Walk 10 feet activity   Assist     Assist level: Minimal Assistance - Patient > 75% Assistive device: Walker-rolling, Prothesis   Walk 50 feet activity   Assist           Walk 150 feet activity   Assist  Walk 10 feet on uneven surface  activity   Assist           Wheelchair     Assist Will patient use wheelchair at discharge?: Yes Type of Wheelchair: Manual    Wheelchair assist level: Supervision/Verbal cueing Max wheelchair distance: 120    Wheelchair 50 feet with 2 turns activity    Assist    Wheelchair 50 feet with 2 turns activity did not occur: Safety/medical concerns(Per report )   Assist Level: Supervision/Verbal cueing   Wheelchair 150 feet activity     Assist Wheelchair 150 feet activity did not  occur: Safety/medical concerns(Per report )   Assist Level: Supervision/Verbal cueing     Medical Problem List and Plan: 1.Functional and mobility deficitssecondary to left lower extremity myositis/strep bacteremia -progressing toward goals  -Patient to see Rehab MD/provider in the office for transitional care encounter in 1-2 weeks.  2. DVT Prophylaxis/Anticoagulation: Pharmaceutical:Lovenox 3.Chronic back pain/Management: Continue oxycodone as needed,  increased to 2 tablets on 11/27   baclofen 10mg  TID    -also discussed use of heat, distraction.  4. Mood:LCSW to follow for evaluation and support. Egosupport to be provided by team 5. Neuropsych: This patientiscapable of making decisions on herown behalf. 6. Skin/Wound Care:Routine pressure relief measures.    -continue shifting, boosting  -ACE wrap LE's for edema control 7. Fluids/Electrolytes/Nutrition:Monitor I's and O's.  -on reg diet, discussed making appropriate food choices 8.Group B strep agalactiaebacteremia: Continue IV Ceftriaxone completed on 11/28 9.T2DM with neuropathy:  blood sugars AC at bedtime.     Lantus changed to 5 units twice daily on 11/28 evening  -sugars under control 12/9 10.Chronic constipation: BM 11/19.  Will continue to augment back bowel program 11.HTN: Monitor blood pressures twice daily.   Bumex held given hyponatremia   Vitals:   04/29/18 1934 04/30/18 0506  BP: (!) 154/92 (!) 171/93  Pulse: 86 86  Resp: 16 19  Temp: 98.2 F (36.8 C) 98.3 F (36.8 C)  SpO2: 99% 99%  Blood pressure elevated, increased amlodipine to 10 mg given that diuretic has been held--getting for first time today  -will need further adjustment by primary as outpt 12.Peripheral edema: Monitor weights daily. -compression/elevation of LE's  -weights holding in the 153kg range  13.Bipolar disorder with depression: Continue Prozac 14.Acute on  Chronic renal failure:    Creatinine down to 1.05 12/9--  close to baseline  Discussed "appropriate" fluid intake   -sodium back up to 135 12/9 15.Anemia of chronic disease with iron deficiency anemia:   iron supplement.   Hemoglobin 7.4 12/6----7.1 12/9  - has had occasional heme + stool, no gross blood or signs of bleeding  Still normocytic (low end)  -consider GI consult vs H/O  - iron panel reviewed and consistent with above  -follow as outpt 16. Morbid obesiy-BMI-48.6: Educate patient on appropriate diet and importance of activity to help promote health and mobility. 17.  Hyperkalemia  Potassium 4.47  18.  Chest pain, not appear cardiac.  ?gas, resolved   LOS: 20 days A FACE TO FACE EVALUATION WAS PERFORMED  Meredith Staggers 04/30/2018, 9:00 AM

## 2018-04-30 NOTE — Progress Notes (Signed)
Occupational Therapy Session Note  Patient Details  Name: [name] [name] MRN: [id number] Date of Birth: [birth year]  Today's Date: 04/30/2018 OT Individual Time: 1000-1100 OT Individual Time Calculation (min): 60 min   Short Term Goals: Week 3:  LTG = STG 2/2 ELOS  Skilled Therapeutic Interventions/Progress Updates:    Pt greeted seated in wc and agreeable to OT treatment session. Completed bathing/dressing tasks as pt will need to do them at home. Pt ambulated into bathroom w/ RW Mod I w/ prosthesis and transferred onto tub bench without assist. Problem solved ways for pt to remove LLE prosthesis using handle of sponge and lifting out of prosthetic. Pt then able to remove sleeve and socks without assist. Bathing completed using LH sponge with set-up A. Problem solved ways to don prosthsis after shower to ambulate out of bathroom with pt needing set-up A. Pt then ambulated out of bathroom w/ RW mod I and got dressed seated in wc. At home, pt will don pants on EOB or couch so she can then lie down and use rolling to pull up over hips. Pt left seated in wc at end of session with needs met.  Therapy Documentation Precautions:  Precautions Precautions: Fall Restrictions Weight Bearing Restrictions: No RLE Weight Bearing: Weight bearing as tolerated LLE Weight Bearing: Non weight bearing GPain: Pain Assessment Pain Scale: 0-10 Pain Score: 7  Faces Pain Scale: Hurts little more Pain Type: Chronic pain Pain Location: Knee Pain Orientation: Left Pain Descriptors / Indicators: Aching;Discomfort Pain Frequency: Constant Pain Onset: On-going Patients Stated Pain Goal: 1 Pain Intervention(s): Repositioned, nursing administered pain meds.    Other Treatments:     Therapy/Group: Individual Therapy  Valma Cava 04/30/2018, 10:28 AM

## 2018-05-01 LAB — GLUCOSE, CAPILLARY
Glucose-Capillary: 102 mg/dL — ABNORMAL HIGH (ref 70–99)
Glucose-Capillary: 106 mg/dL — ABNORMAL HIGH (ref 70–99)

## 2018-05-01 MED ORDER — OXYCODONE-ACETAMINOPHEN 5-325 MG PO TABS: 1.0000 | ORAL_TABLET | Freq: Three times a day (TID) | ORAL | 0 refills | Status: DC | PRN

## 2018-05-01 MED ORDER — AMLODIPINE BESYLATE 10 MG PO TABS: 10.0000 mg | ORAL_TABLET | Freq: Every day | ORAL | 0 refills | Status: DC

## 2018-05-01 MED ORDER — POLYSACCHARIDE IRON COMPLEX 150 MG PO CAPS: 150.0000 mg | ORAL_CAPSULE | Freq: Two times a day (BID) | ORAL | 0 refills | Status: DC

## 2018-05-01 MED ORDER — BACLOFEN 10 MG PO TABS: 10.0000 mg | ORAL_TABLET | Freq: Three times a day (TID) | ORAL | 0 refills | Status: DC

## 2018-05-01 MED ORDER — INSULIN GLARGINE 100 UNIT/ML ~~LOC~~ SOLN: 3.0000 [IU] | Freq: Two times a day (BID) | SUBCUTANEOUS | 11 refills | Status: DC

## 2018-05-01 MED ORDER — LIDOCAINE 5 % EX PTCH: 1.0000 | MEDICATED_PATCH | CUTANEOUS | 0 refills | Status: DC

## 2018-05-01 MED ORDER — METOPROLOL TARTRATE 25 MG PO TABS: 12.5000 mg | ORAL_TABLET | Freq: Two times a day (BID) | ORAL | 0 refills | Status: DC

## 2018-05-01 MED ORDER — PREGABALIN 25 MG PO CAPS: 25.0000 mg | ORAL_CAPSULE | Freq: Two times a day (BID) | ORAL | 0 refills | Status: DC

## 2018-05-01 MED ORDER — SIMETHICONE 40 MG/0.6ML PO SUSP: 80.0000 mg | Freq: Four times a day (QID) | ORAL | 0 refills | Status: DC

## 2018-05-01 MED ORDER — BLOOD GLUCOSE MONITOR KIT: PACK | 0 refills | Status: AC

## 2018-05-01 MED ORDER — SENNOSIDES-DOCUSATE SODIUM 8.6-50 MG PO TABS: 2.0000 | ORAL_TABLET | Freq: Two times a day (BID) | ORAL | 0 refills | Status: DC

## 2018-05-01 MED ORDER — DM-GUAIFENESIN ER 30-600 MG PO TB12: 1.0000 | ORAL_TABLET | Freq: Two times a day (BID) | ORAL | Status: DC | PRN

## 2018-05-01 MED ORDER — TRAMADOL HCL 50 MG PO TABS: 50.0000 mg | ORAL_TABLET | Freq: Four times a day (QID) | ORAL | 0 refills | Status: DC | PRN

## 2018-05-01 MED ORDER — MUSCLE RUB 10-15 % EX CREA: 1.0000 "application " | TOPICAL_CREAM | Freq: Four times a day (QID) | CUTANEOUS | 0 refills | Status: DC | PRN

## 2018-05-01 MED ORDER — METOPROLOL TARTRATE 12.5 MG HALF TABLET
12.5000 mg | ORAL_TABLET | Freq: Two times a day (BID) | ORAL | Status: DC
  Administered 2018-05-01: 12.5 mg via ORAL
  Filled 2018-05-01: qty 1

## 2018-05-01 MED ORDER — ACETAMINOPHEN 325 MG PO TABS: 325.0000 mg | ORAL_TABLET | ORAL | Status: DC | PRN

## 2018-05-01 NOTE — Progress Notes (Signed)
[place] PHYSICAL MEDICINE & REHABILITATION PROGRESS NOTE   Subjective/Complaints:  Up in chair getting herself ready. She is having some gas but otherwise feeling ok  ROS: Patient denies fever, rash, sore throat, blurred vision, nausea, vomiting, diarrhea, cough, shortness of breath or chest pain, joint or back pain, headache, or mood change.    Objective:   No results found. Recent Labs    04/30/18 0355  WBC 4.5  HGB 7.1*  HCT 25.2*  PLT 396   Recent Labs    04/30/18 0355  NA 135  K 4.7  CL 103  CO2 27  GLUCOSE 109*  BUN 31*  CREATININE 1.05*  CALCIUM 8.6*    Intake/Output Summary (Last 24 hours) at 05/01/2018 0847 Last data filed at 05/01/2018 0820 Gross per 24 hour  Intake 582 ml  Output -  Net 582 ml     Physical Exam: Vital Signs Blood pressure (!) 167/99, pulse 84, temperature 97.6 F (36.4 C), resp. rate 16, height 5\' 10"  (1.778 m), weight (!) 148.6 kg, SpO2 99 %. Constitutional: No distress . Vital signs reviewed. HEENT: EOMI, oral membranes moist Neck: supple Cardiovascular: RRR without murmur. No JVD    Respiratory: CTA Bilaterally without wheezes or rales. Normal effort    GI: BS +, non-tender, non-distended  Musculoskeletal: continued edema bilateral LE, 1+ without change. left BK pros off today.  Neurological: She alert and oriented  Motor:   LLE: HF 4/5, moving left leg a bit more freely RLE: 4+/5 prox to distal with pain inhibition.  Skin:   Left stump intact, dry skin Right foot with chronic wound-without change Psychiatric:pleasant  Assessment/Plan: 1. Functional deficits secondary to LLE myositis which require 3+ hours per day of interdisciplinary therapy in a comprehensive inpatient rehab setting.  Physiatrist is providing close team supervision and 24 hour management of active medical problems listed below.  Physiatrist and rehab team continue to assess barriers to discharge/monitor patient progress toward functional and  medical goals  Care Tool:  Bathing    Body parts bathed by patient: Left arm, Right arm, Chest, Abdomen, Front perineal area, Face, Right upper leg, Left upper leg, Right lower leg, Buttocks   Body parts bathed by helper: Right lower leg Body parts n/a: Left lower leg   Bathing assist Assist Level: Set up assist     Upper Body Dressing/Undressing Upper body dressing   What is the patient wearing?: Pull over shirt    Upper body assist Assist Level: Independent    Lower Body Dressing/Undressing Lower body dressing      What is the patient wearing?: Pants     Lower body assist Assist for lower body dressing: Supervision/Verbal cueing     Toileting Toileting    Toileting assist Assist for toileting: Supervision/Verbal cueing     Transfers Chair/bed transfer  Transfers assist     Chair/bed transfer assist level: Independent with assistive device     Locomotion Ambulation   Ambulation assist   Ambulation activity did not occur: Safety/medical concerns  Assist level: Supervision/Verbal cueing Assistive device: Walker-rolling Max distance: 150   Walk 10 feet activity   Assist     Assist level: Supervision/Verbal cueing Assistive device: Walker-rolling, Prothesis   Walk 50 feet activity   Assist    Assist level: Supervision/Verbal cueing      Walk 150 feet activity   Assist    Assist level: Supervision/Verbal cueing      Walk 10 feet on uneven surface  activity  Assist Walk 10 feet on uneven surfaces activity did not occur: Safety/medical concerns         Wheelchair     Assist Will patient use wheelchair at discharge?: Yes Type of Wheelchair: Manual    Wheelchair assist level: Independent Max wheelchair distance: 150    Wheelchair 50 feet with 2 turns activity    Assist    Wheelchair 50 feet with 2 turns activity did not occur: Safety/medical concerns(Per report )   Assist Level: Independent   Wheelchair  150 feet activity     Assist Wheelchair 150 feet activity did not occur: Safety/medical concerns(Per report )   Assist Level: Independent     Medical Problem List and Plan: 1.Functional and mobility deficitssecondary to left lower extremity myositis/strep bacteremia -dc home today  -Patient to see Rehab MD/provider in the office for transitional care encounter in 1-2 weeks.  2. DVT Prophylaxis/Anticoagulation: Pharmaceutical:Lovenox 3.Chronic back pain/Management: Continue oxycodone as needed,  increased to 2 tablets on 11/27   baclofen 10mg  TID       4. Mood:LCSW to follow for evaluation and support. Egosupport to be provided by team 5. Neuropsych: This patientiscapable of making decisions on herown behalf. 6. Skin/Wound Care:Routine pressure relief measures.    -continue shifting, boosting  -ACE wrap LE's for edema control 7. Fluids/Electrolytes/Nutrition:Monitor I's and O's.  -on reg diet, discussed making appropriate food choices 8.Group B strep agalactiaebacteremia: Continue IV Ceftriaxone completed on 11/28 9.T2DM with neuropathy:  blood sugars AC at bedtime.     Sugars runing low on reduced lantus---dc lantus 10.Chronic constipation: BM 11/19.  Will continue to augment back bowel program 11.HTN: Monitor blood pressures twice daily.   Bumex held given hyponatremia   Vitals:   04/30/18 2138 05/01/18 0422  BP: (!) 148/93 (!) 167/99  Pulse: 82 84  Resp: 14 16  Temp: (!) 97.5 F (36.4 C) 97.6 F (36.4 C)  SpO2: 97% 99%  Blood pressure elevated, increased amlodipine to 10 mg with no improvement  -add low dose metoprolol 12.5mg  bid 12.Peripheral edema: Monitor weights daily. -compression/elevation of LE's  -weights holding in the 153kg range  13.Bipolar disorder with depression: Continue Prozac 14.Acute on  Chronic renal failure:   Creatinine down to 1.05 12/9--  close to baseline  Discussed "appropriate" fluid  intake   -sodium back up to 135 12/9 15.Anemia of chronic disease with iron deficiency anemia:   iron supplement.   Hemoglobin 7.4 12/6----7.1 12/9  - has had occasional heme + stool, no gross blood or signs of bleeding  Still normocytic (low end)  -outpt H/O referral  - iron panel reviewed and consistent with above   16. Morbid obesiy-BMI-48.6: Educate patient on appropriate diet and importance of activity to help promote health and mobility. 17.  Hyperkalemia  Potassium 4.47  18.  Chest pain, not appear cardiac.  ?gas, resolved   LOS: 21 days A FACE TO FACE EVALUATION WAS PERFORMED  Meredith Staggers 05/01/2018, 8:47 AM

## 2018-05-01 NOTE — Plan of Care (Signed)
  Problem: Consults Goal: RH GENERAL PATIENT EDUCATION Description See Patient Education module for education specifics. Outcome: Progressing   Problem: RH BOWEL ELIMINATION Goal: RH STG MANAGE BOWEL W/MEDICATION W/ASSISTANCE Description STG Manage Bowel with Medication with mod  Assistance.  Outcome: Progressing   Problem: RH BLADDER ELIMINATION Goal: RH STG MANAGE BLADDER WITH ASSISTANCE Description STG Manage Bladder With min Assistance  Outcome: Progressing Goal: RH STG MANAGE BLADDER WITH EQUIPMENT WITH ASSISTANCE Description STG Manage Bladder With Equipment With min Assistance  Outcome: Progressing   Problem: RH SKIN INTEGRITY Goal: RH STG SKIN FREE OF INFECTION/BREAKDOWN Description Monitor skin every shift with min assistance  Outcome: Progressing Goal: RH STG MAINTAIN SKIN INTEGRITY WITH ASSISTANCE Description STG Maintain Skin Integrity With min Assistance.  Outcome: Progressing Goal: RH STG ABLE TO PERFORM INCISION/WOUND CARE W/ASSISTANCE Description STG Able To Perform Incision/Wound Care With mod Assistance.  Outcome: Progressing   Problem: RH SAFETY Goal: RH STG ADHERE TO SAFETY PRECAUTIONS W/ASSISTANCE/DEVICE Description STG Adhere to Safety Precautions With min Assistance/Device.  Outcome: Progressing   Problem: RH PAIN MANAGEMENT Goal: RH STG PAIN MANAGED AT OR BELOW PT'S PAIN GOAL Description Pt will report pain less than 2 with min assistance  Outcome: Progressing   Problem: RH KNOWLEDGE DEFICIT GENERAL Goal: RH STG INCREASE KNOWLEDGE OF SELF CARE AFTER HOSPITALIZATION Description Pt will be able to verbalize importance of ABT therapy and to monitor skin after discharge with min assistance  Outcome: Progressing

## 2018-05-01 NOTE — Progress Notes (Signed)
Social Work  Discharge Note  The overall goal for the admission was met for:   Discharge location: Yes - returning home with nephew who can provide intermittent assistance  Length of Stay: Yes - 21 days  Discharge activity level: Yes - modified independent w/c level  Home/community participation: Yes  Services provided included: MD, RD, PT, OT, RN, Pharmacy, [place]: Medicare and Medicaid  Follow-up services arranged: Home Health: RN, PT OT via [place], DME: ELRs for existing w/c via [place] and Patient/Family has no preference for HH/DME agencies  Comments (or additional information):  Patient/Family verbalized understanding of follow-up arrangements: Yes  Individual responsible for coordination of the follow-up plan: pt  Confirmed correct DME delivered: Marshun Duva 05/01/2018    Lyann Hagstrom

## 2018-05-01 NOTE — Discharge Instructions (Signed)
Inpatient Rehab Discharge Instructions  [name] [name] Discharge date and time:  05/01/18  Activities/Precautions/ Functional Status: Activity: No lifting, driving, or strenuous exercise till cleared by MD Diet: cardiac diet and diabetic diet Wound Care: keep wound clean and dry    Functional status:  ___ No restrictions     ___ Walk up steps independently _X__ 24/7 supervision/assistance   ___ Walk up steps with assistance ___ Intermittent supervision/assistance  ___ Bathe/dress independently ___ Walk with walker     ___ Bathe/dress with assistance ___ Walk Independently    ___ Shower independently ___ Walk with assistance    _X__ Shower with assistance _X__ No alcohol     ___ Return to work/school ________    COMMUNITY REFERRALS UPON DISCHARGE:    Home Health:   PT     OT   RN                        Agency:  [facility] Phone: [phone]    Medical Equipment/Items Ordered: elevating leg rests for existing wheelchair                                                    Agency/Supplier:  [facility]    GENERAL COMMUNITY RESOURCES FOR PATIENT/FAMILY:  Support Groups:  Amputee Support Group (see flyer)     Special Instructions: 1. Check blood sugars before meals and at bedtime.   My questions have been answered and I understand these instructions. I will adhere to these goals and the provided educational materials after my discharge from the hospital.  Patient/Caregiver Signature _______________________________ Date __________  Clinician Signature _______________________________________ Date __________  Please bring this form and your medication list with you to all your follow-up doctor's appointments.

## 2018-05-01 NOTE — Progress Notes (Signed)
Pt. Got d/c instructions and equipment,pt. Is ready to go home.

## 2018-05-01 NOTE — Progress Notes (Signed)
Recreational Therapy Discharge Summary Patient Details  Name: [name] [name] MRN: [id number] Date of Birth: 07-28-1975 Today's Date: 05/01/2018  Long term goals set: 2  Long term goals met: 2  Comments on progress toward goals: Pt has made great progress during LOS and is ready for discharge home at supervision level for TR tasks including community reintegration at w/c level. Pt is Mod I w/c level for simple tasks.  TR sessions focused on leisure assessment, activity analysis with potential adaptations, community reintegration and pt education.  Pt is anxious to return home and plans to return to an active lifestyle as she continues to make improvements.  Reasons goals not met:2  Reasons for discharge: discharge from hospital  Patient/family agrees with progress made and goals achieved: Yes  Cambridge Deleo 05/01/2018, 1:35 PM

## 2018-05-02 ENCOUNTER — Telehealth: Payer: Self-pay

## 2018-05-02 NOTE — Discharge Summary (Signed)
Physician Discharge Summary  Patient ID: [name] [name] MRN: [id number] DOB/AGE: May 05, 1976 42 y.o.  Admit date: 04/10/2018 Discharge date: 05/02/2018  Discharge Diagnoses:  Principal Problem:   Debility Active Problems:   Chronic kidney disease (CKD), stage III (moderate) (HCC)   Morbid obesity (HCC)   Anemia of chronic disease   Peripheral edema   Essential hypertension   Diabetes mellitus type 2 in obese (HCC)   Bacteremia   Current moderate episode of major depressive disorder (HCC)   AKI (acute kidney injury) ([place])   Discharged Condition: stable   Significant Diagnostic Studies: N/A   Labs:  Basic Metabolic Panel: BMP Latest Ref Rng & Units 04/30/2018 04/27/2018 04/25/2018  Glucose 70 - 99 mg/dL 109(H) 120(H) 125(H)  BUN 6 - 20 mg/dL 31(H) 39(H) 43(H)  Creatinine 0.44 - 1.00 mg/dL 1.05(H) 1.35(H) 1.60(H)  BUN/Creat Ratio 9 - 23 - - -  Sodium 135 - 145 mmol/L 135 137 131(L)  Potassium 3.5 - 5.1 mmol/L 4.7 4.2 4.4  Chloride 98 - 111 mmol/L 103 103 98  CO2 22 - 32 mmol/L _0 Calcium 8.9 - 10.3 mg/dL 8.6(L) 8.9 8.6(L)    CBC: CBC Latest Ref Rng & Units 04/30/2018 04/27/2018 04/25/2018  WBC 4.0 - 10.5 K/uL 4.5 4.9 5.1  Hemoglobin 12.0 - 15.0 g/dL 7.1(L) 7.4(L) 7.0(L)  Hematocrit 36.0 - 46.0 % 25.2(L) 25.9(L) 23.4(L)  Platelets 150 - 400 K/uL 396 364 306    CBG: Recent Labs  Lab 04/30/18 1642 04/30/18 2218 05/01/18 0727 05/01/18 1147  GLUCAP 84 138* 102* 106*     Brief HPI:   [name] is a 42 year old female with history of T2DM, OSA, CKD, morbid obesity-BMI 48.6, ongoing tobacco use, L-BKA who was admitted on 04/03/18 with fever due to sepsis from group B bacteremia. She was started on broad spectrum antibiotics and MR LLE done revealing generalized soft tissue edema in distal thigh and residual gastrocnemius felt to be due to inflammatory myositis. Dr.Duda recommended conservative care with 2 weeks of antibiotic regimen. Antibiotic  narrowed to ceftriaxone with end date 04/19/18. Hospital course significant for acute on chronic anemia--transfused with 1 units PRBC, acute on chronic renal failure, peripheral edema and debility affecting mobility and ADLs. CIR recommended due to functional deficits.    Hospital Course: [name] was admitted to rehab 04/10/2018 for inpatient therapies to consist of PT and OT at least three hours five days a week. Past admission physiatrist, therapy team and rehab RN have worked together to provide customized collaborative inpatient rehab.  She was maintained on IV ceftriaxone thorough 11/28 and has tolerated this without SE. Blood pressures have been monitored on twice daily basis and amlodipine was increased to 10 mg and low-dose metoprolol was added for tighter control.  Bumex was discontinued due to hyponatremia.  Acute on chronic renal failure has improved with serum creatinine close to baseline.  Weights have been monitored daily and stable at 153 kg.  On 11/26 , she had drop in her H&H to 6.4/22.6 and was transfused with 2 units packed red blood cells.  Stool guaiacs have been monitored and she was noted to have intermittent heme positive stools but no other signs of bleeding noted.  She continues on iron supplement and serial CBC showed H&H trending back down to 7.1.  Labs consistent with iron deficiency anemia and she continues on iron supplement. She has been referred to hematology for further work-up.   She is continent of  bowel and bladder.  Diabetes has been monitored with ac/hs CBG  checks and Lantus was discontinued due to hypoglycemic episodes.  Team has provided equal support to help with situational depression and mood has improved. She has been educated on appropriate diet as well as importance of medical compliance.  As her intake improved, her BS started trending upwards and and Lantus was resumed at 3 units twice daily.  She was encouraged continue with her modified diet at  home as well as monitor blood sugars before meals at bedtime basis. She continued to report problems with left knee pain and lidocaine patch was added for local measures in addition to prn use of percocet.  Her prosthesis has been remade due to weight loss and has undergone prosthetic training. She has made steady progress and is currently modified independent at wheelchair level. She will continue to receive follow up [place] [place] [facility] [place] [facility] [place] after discharge.    Rehab course: During patient's stay in rehab weekly team conferences were held to monitor patient's progress, set goals and discuss barriers to discharge. At admission, patient required max assist with basic self care tasks and total assist with mobility.  She  has had improvement in activity tolerance, balance, postural control as well as ability to compensate for deficits.  She is able to complete ADL tasks at modified independent level. She is modified independent for transfers and is able to ambulate 150; with RW, cues for safety and supervision.  She is able to propel her wheelchair for 200' at modified independent level. She is able to step up one stair with min assist and step down with supervision. Supervision is recommended for safety. She has been educated on energy conservation measures, HEP and tips for safety at home.    Disposition: Home  Diet: Carb Modified medium.   Special Instructions: 1. Monitor BS ac/hs.  Increase lantus by 5 units daily to home dose if BS trend >150 in 24 hour period. 2. Needs supervision for ambulation.   Discharge Instructions    Ambulatory referral to Hematology   Complete by:  As directed    Work up of anemia   Ambulatory referral to Physical Medicine Rehab   Complete by:  As directed    1-2 weeks transitional care appt     Allergies as of 05/01/2018      Reactions   Vancomycin Other (See Comments)   Acute renal failure suspected secondary to vanco      Medication List     STOP taking these medications   amitriptyline 50 MG tablet Commonly known as:  ELAVIL   bumetanide 1 MG tablet Commonly known as:  BUMEX   cefTRIAXone 2 g in sodium chloride 0.9 % 100 mL   cyclobenzaprine 10 MG tablet Commonly known as:  FLEXERIL   docusate sodium 100 MG capsule Commonly known as:  COLACE   feeding supplement (PRO-STAT SUGAR FREE 64) Liqd   ibuprofen 200 MG tablet Commonly known as:  ADVIL,MOTRIN   insulin aspart 100 UNIT/ML FlexPen Commonly known as:  NOVOLOG   mupirocin ointment 2 % Commonly known as:  BACTROBAN   nystatin powder Commonly known as:  MYCOSTATIN/NYSTOP   ULTICARE INSULIN SYRINGE 31G X 1/4" 0.5 ML Misc Generic drug:  Insulin Syringe-Needle U-100     TAKE these medications   acetaminophen 325 MG tablet Commonly known as:  TYLENOL Take 1-2 tablets (325-650 mg total) by mouth every 4 (four) hours as needed for mild pain.  amLODipine 10 MG tablet Commonly known as:  NORVASC Take 1 tablet (10 mg total) by mouth daily.   atorvastatin 20 MG tablet Commonly known as:  LIPITOR TAKE 1 TABLET BY MOUTH EVERY DAY   baclofen 10 MG tablet Commonly known as:  LIORESAL Take 1 tablet (10 mg total) by mouth 3 (three) times daily.   blood glucose meter kit and supplies Kit ICD 10- E11.69. Based on patient and insurance preference. Use up to four times daily as directed.   dextromethorphan-guaiFENesin 30-600 MG 12hr tablet Commonly known as:  MUCINEX DM Take 1 tablet by mouth 2 (two) times daily as needed for cough.   diclofenac sodium 1 % Gel Commonly known as:  VOLTAREN Apply 4 grams to affected area 4 times daily as needed for pain What changed:  See the new instructions.   FLUoxetine 20 MG/5ML solution Commonly known as:  PROZAC Take 15 mLs (60 mg total) by mouth daily.   insulin glargine 100 UNIT/ML injection Commonly known as:  LANTUS Inject 0.03 mLs (3 Units total) into the skin 2 (two) times daily. What changed:  how much  to take   iron polysaccharides 150 MG capsule Commonly known as:  NIFEREX Take 1 capsule (150 mg total) by mouth 2 (two) times daily before lunch and supper.   lidocaine 5 % Commonly known as:  LIDODERM Place 1 patch onto the skin daily. To left knee at 7 am and remove at 7 pm Notes to patient:  Available over the counter--apply to left knee at 7 am and remove at 7 pm daily   metoprolol tartrate 25 MG tablet Commonly known as:  LOPRESSOR Take 0.5 tablets (12.5 mg total) by mouth 2 (two) times daily.   MUSCLE RUB 10-15 % Crea Apply 1 application topically 4 (four) times daily as needed for muscle pain.   oxyCODONE-acetaminophen 5-325 MG tablet--Rx # 30 pills Commonly known as:  PERCOCET/ROXICET Take 1-2 tablets by mouth every 8 (eight) hours as needed for severe pain. What changed:    how much to take  when to take this  reasons to take this   polyethylene glycol packet Commonly known as:  MIRALAX / GLYCOLAX Take 17 g by mouth daily. What changed:    when to take this  reasons to take this   pregabalin 25 MG capsule Commonly known as:  LYRICA Take 1 capsule (25 mg total) by mouth 2 (two) times daily. What changed:    medication strength  how much to take  when to take this   senna-docusate 8.6-50 MG tablet Commonly known as:  Senokot-S Take 2 tablets by mouth 2 (two) times daily.   simethicone 40 MG/0.6ML drops Commonly known as:  MYLICON Take 1.2 mLs (80 mg total) by mouth 4 (four) times daily.   traMADol 50 MG tablet--Rx# 20 pills Commonly known as:  ULTRAM Take 1 tablet (50 mg total) by mouth every 6 (six) hours as needed for moderate pain.   VICTOZA 18 MG/3ML Sopn Generic drug:  liraglutide Inject 0.3 mLs (1.8 mg total) into the skin daily.      Follow-up Information    Meredith Staggers, MD Follow up.   Specialty:  Physical Medicine and Rehabilitation Why:  office will call you with follow up appointment Contact information: [address] [place] [address] [phone]        Ina Homes, MD Follow up on 05/07/2018.   Specialty:  Internal Medicine Why:  @ 3:15 pm (  hospital follow up appt) Contact information: [place] [address] [phone]        Milas Gain, PA-C Follow up on 05/11/2018.   Specialty:  Orthopedic Surgery Why:  be there at 10:45 am  Contact information: [address] [place] [address] [phone]           Signed: Bary Leriche 05/07/2018, 12:34 PM

## 2018-05-02 NOTE — Telephone Encounter (Signed)
Transitional Care call  Patient name: [name] [name]) DOB: (06-14-1975) 1. Are you/is patient experiencing any problems since coming home? (NO) a. Are there any questions regarding any aspect of care? (NA) 2. Are there any questions regarding medications administration/dosing? (NO) a. Are meds being taken as prescribed? (NA) b. "Patient should review meds with caller to confirm"  3. Have there been any falls? (NO) 4. Has Home Health been to the house and/or have they contacted you? (YES) a. If not, have you tried to contact them? (NA) b. Can we help you contact them? (NA) 5. Are bowels and bladder emptying properly? (YES) a. Are there any unexpected incontinence issues? (NA) b. If applicable, is patient following bowel/bladder programs? (NA) 6. Any fevers, problems with breathing, unexpected pain? (NO) 7. Are there any skin problems or new areas of breakdown? (NO) 8. Has the patient/family member arranged specialty MD follow up (ie cardiology/neurology/renal/surgical/etc.)?  (YES) a. Can we help arrange? (NA) 9. Does the patient need any other services or support that we can help arrange? (NO) 10. Are caregivers following through as expected in assisting the patient? (YES) 11. Has the patient quit smoking, drinking alcohol, or using drugs as recommended? (YES)  Appointment date/time (05-08-2018 / 1100am), arrive time (1040am) and who it is with here (Dr. Naaman Plummer) Michie

## 2018-05-04 ENCOUNTER — Telehealth: Payer: Self-pay | Admitting: Internal Medicine

## 2018-05-04 NOTE — Telephone Encounter (Signed)
TOC-HFU APPT 05/07/2018 [facility] WITH DR HELBERG

## 2018-05-07 ENCOUNTER — Telehealth: Payer: Self-pay

## 2018-05-07 ENCOUNTER — Encounter: Payer: Self-pay | Admitting: Internal Medicine

## 2018-05-07 DIAGNOSIS — I129 Hypertensive chronic kidney disease with stage 1 through stage 4 chronic kidney disease, or unspecified chronic kidney disease: Secondary | ICD-10-CM | POA: Diagnosis not present

## 2018-05-07 DIAGNOSIS — Z6841 Body Mass Index (BMI) 40.0 and over, adult: Secondary | ICD-10-CM | POA: Diagnosis not present

## 2018-05-07 DIAGNOSIS — E1122 Type 2 diabetes mellitus with diabetic chronic kidney disease: Secondary | ICD-10-CM | POA: Diagnosis not present

## 2018-05-07 DIAGNOSIS — F319 Bipolar disorder, unspecified: Secondary | ICD-10-CM | POA: Diagnosis not present

## 2018-05-07 DIAGNOSIS — E1142 Type 2 diabetes mellitus with diabetic polyneuropathy: Secondary | ICD-10-CM | POA: Diagnosis not present

## 2018-05-07 DIAGNOSIS — Z89512 Acquired absence of left leg below knee: Secondary | ICD-10-CM | POA: Diagnosis not present

## 2018-05-07 DIAGNOSIS — Z794 Long term (current) use of insulin: Secondary | ICD-10-CM | POA: Diagnosis not present

## 2018-05-07 DIAGNOSIS — D631 Anemia in chronic kidney disease: Secondary | ICD-10-CM | POA: Diagnosis not present

## 2018-05-07 DIAGNOSIS — F17210 Nicotine dependence, cigarettes, uncomplicated: Secondary | ICD-10-CM | POA: Diagnosis not present

## 2018-05-07 DIAGNOSIS — N189 Chronic kidney disease, unspecified: Secondary | ICD-10-CM | POA: Diagnosis not present

## 2018-05-07 DIAGNOSIS — M60062 Infective myositis, left lower leg: Secondary | ICD-10-CM | POA: Diagnosis not present

## 2018-05-07 NOTE — Telephone Encounter (Signed)
Tharon Aquas, PT/[facility] called requesting verbal orders for HHPT 2wk1, 1wk3 and HHOT 1wk4. Orders approved and given per discharge summary.

## 2018-05-08 ENCOUNTER — Encounter: Payer: Medicare Other | Admitting: Physical Medicine & Rehabilitation

## 2018-05-09 NOTE — Telephone Encounter (Signed)
Patient no showed HFU on 05/07/2018. Attempted to reach patient to make TOC call and r/s HFU. No answer and VM box is full and cannot accept new messages at this time. Hubbard Hartshorn, RN, BSN

## 2018-05-10 ENCOUNTER — Telehealth: Payer: Self-pay | Admitting: Internal Medicine

## 2018-05-10 ENCOUNTER — Encounter: Payer: Self-pay | Admitting: Internal Medicine

## 2018-05-10 NOTE — Telephone Encounter (Signed)
Voicemail full.

## 2018-05-10 NOTE — Telephone Encounter (Signed)
A hematology appt has been scheduled to see Dr. Walden Field on 06/07/18 at 1pm. Letter mailed to the appt date and time.

## 2018-05-11 ENCOUNTER — Telehealth: Payer: Self-pay | Admitting: *Deleted

## 2018-05-11 ENCOUNTER — Encounter (INDEPENDENT_AMBULATORY_CARE_PROVIDER_SITE_OTHER): Payer: Self-pay | Admitting: Physician Assistant

## 2018-05-11 ENCOUNTER — Ambulatory Visit (INDEPENDENT_AMBULATORY_CARE_PROVIDER_SITE_OTHER): Payer: Medicare Other | Admitting: Physician Assistant

## 2018-05-11 VITALS — Ht 70.0 in | Wt 327.0 lb

## 2018-05-11 DIAGNOSIS — Z89512 Acquired absence of left leg below knee: Secondary | ICD-10-CM

## 2018-05-11 DIAGNOSIS — E1142 Type 2 diabetes mellitus with diabetic polyneuropathy: Secondary | ICD-10-CM

## 2018-05-11 DIAGNOSIS — I890 Lymphedema, not elsewhere classified: Secondary | ICD-10-CM

## 2018-05-11 DIAGNOSIS — M1712 Unilateral primary osteoarthritis, left knee: Secondary | ICD-10-CM | POA: Diagnosis not present

## 2018-05-11 DIAGNOSIS — N182 Chronic kidney disease, stage 2 (mild): Secondary | ICD-10-CM

## 2018-05-11 MED ORDER — LIDOCAINE HCL 1 % IJ SOLN
5.0000 mL | INTRAMUSCULAR | Status: AC | PRN
  Administered 2018-05-11: 5 mL

## 2018-05-11 MED ORDER — METHYLPREDNISOLONE ACETATE 40 MG/ML IJ SUSP
40.0000 mg | INTRAMUSCULAR | Status: AC | PRN
  Administered 2018-05-11: 40 mg via INTRA_ARTICULAR

## 2018-05-11 MED ORDER — OXYCODONE-ACETAMINOPHEN 5-325 MG PO TABS: 1.0000 | ORAL_TABLET | Freq: Three times a day (TID) | ORAL | 0 refills | Status: DC | PRN

## 2018-05-11 NOTE — Telephone Encounter (Signed)
Henderson Newcomer, OT, [facility] left a message asking for verbal orders for [facility] 1week4.Medical record reviewed. Social work note reviewed. Verbal orders given per office protocol.

## 2018-05-11 NOTE — Progress Notes (Signed)
Office Visit Note   Patient: [name] [name]           Date of Birth: March 07, 1976           MRN: [id number] Visit Date: 05/11/2018              Requested by: Ina Homes, MD [address] [place], [place] [address] PCP: Ina Homes, MD  Chief Complaint  Patient presents with  . Left Knee - Pain      HPI: The patient is a 42 yo woman who is seen for follow up of her left transtibial amputation. She has just completed a course of IV antibiotics and was in inpatient rehabilitation and has had a major improvement in her functional mobility and with lymphedema control of the left transtibial amputation. She did have xrays of the left knee during her hospital stay and these showed moderate OA of the left knee. She comes in today with left knee pain . She indicates pain across the knee joint. She reports she has been wearing and ambulating well with her prosthesis, but her left knee is really limiting her activity. Her pain radiates into the left thigh at times.    Assessment & Plan: Visit Diagnoses:  1. Status post below knee amputation, left (HCC)   2. Lymphedema of left lower extremity   3. Unilateral primary osteoarthritis, left knee   4. Type 2 diabetes mellitus with diabetic polyneuropathy, unspecified whether long term insulin use ([place])   5. CKD (chronic kidney disease) stage 2, GFR 60-89 ml/min     Plan: After informed consent, the patient's left knee was injected with lidocaine and Depo-medrol and she tolerated this well. She will follow up in 4 weeks or sooner if difficulties in the interim.   Follow-Up Instructions: Return in about 4 weeks (around 06/08/2018).   Ortho Exam  Patient is alert, oriented, no adenopathy, well-dressed, normal affect, normal respiratory effort. The left knee is tender to palpation over the joint both medially and laterally. There is mild valgus angulation. No instability. Mild crepitus with range of motion.   Imaging: No results  found. No images are attached to the encounter.  Labs: Lab Results  Component Value Date   HGBA1C 15.5 (H) 04/05/2018   HGBA1C 11.4 (A) 11/30/2017   HGBA1C 9.6 09/07/2017   ESRSEDRATE 130 (H) 03/24/2017   ESRSEDRATE 53 (H) 09/14/2015   ESRSEDRATE 132 (H) 07/06/2012   CRP 38.6 (H) 04/03/2018   CRP 5.4 (H) 09/14/2015   CRP 23.0 (H) 07/06/2012   REPTSTATUS 04/10/2018 FINAL 04/05/2018   REPTSTATUS 04/10/2018 FINAL 04/05/2018   GRAMSTAIN  08/11/2017    MODERATE WBC PRESENT, PREDOMINANTLY PMN FEW GRAM POSITIVE COCCI    CULT  04/05/2018    NO GROWTH 5 DAYS Performed at [facility], [place] [address] [place], [place] [address]    CULT  04/05/2018    NO GROWTH 5 DAYS Performed at [facility] [address], [place],  [address]    LABORGA GROUP B STREP(S.AGALACTIAE)ISOLATED 04/03/2018     Lab Results  Component Value Date   ALBUMIN <1.0 (L) 04/11/2018   ALBUMIN 1.6 (L) 04/03/2018   ALBUMIN 1.9 (L) 08/11/2017    Body mass index is 46.92 kg/m.  Orders:  Orders Placed This Encounter  Procedures  . Large Joint Inj   Meds ordered this encounter  Medications  . oxyCODONE-acetaminophen (PERCOCET/ROXICET) 5-325 MG tablet    Sig: Take 1-2 tablets by mouth every 8 (  eight) hours as needed for moderate pain or severe pain.    Dispense:  30 tablet    Refill:  0     Procedures: Large Joint Inj: L knee on 05/11/2018 3:01 PM Indications: pain and diagnostic evaluation Details: 22 G 1.5 in needle, anteromedial approach  Arthrogram: No  Medications: 5 mL lidocaine 1 %; 40 mg methylPREDNISolone acetate 40 MG/ML Outcome: tolerated well, no immediate complications Procedure, treatment alternatives, risks and benefits explained, specific risks discussed. Consent was given by the patient. Immediately prior to procedure a time out was called to verify the correct patient, procedure, equipment, support staff and site/side marked as required. Patient was prepped and  draped in the usual sterile fashion.      Clinical Data: No additional findings.  ROS:  All other systems negative, except as noted in the HPI. Review of Systems  Objective: Vital Signs: Ht 5\' 10"  (1.778 m)   Wt (!) 327 lb (148.3 kg)   BMI 46.92 kg/m   Specialty Comments:  No specialty comments available.  PMFS History: Patient Active Problem List   Diagnosis Date Noted  . AKI (acute kidney injury) ([place])   . Current moderate episode of major depressive disorder ([place])   . Morbid obesity ([place])   . Anemia of chronic disease   . Peripheral edema   . Essential hypertension   . Diabetes mellitus type 2 in obese ([place])   . Bacteremia   . Debility 04/10/2018  . Bacteremia due to group B Streptococcus 04/04/2018  . Severe protein-calorie malnutrition ([place])   . BKA stump complication ([place]) 66/10/3014  . Vaginal candidiasis 03/12/2018  . Herpes genitalis in women 01/31/2018  . Herpes simplex type 1 infection 01/16/2018  . Status post below knee amputation, left ([place]) 04/11/2017  . Bipolar affective disorder ([place])   . Adjustment disorder with depressed mood 03/26/2017  . Abnormal ECG 03/24/2017  . Sepsis ([place]) 03/24/2017  . Heart failure with preserved ejection fraction ([place]) 02/09/2017  . Routine adult health maintenance 12/08/2016  . Chronic pain of right knee 09/08/2016  . Chronic kidney disease (CKD), stage III (moderate) ([place]) 07/15/2016  . OSA (obstructive sleep apnea) 05/13/2016  . Morbid obesity due to excess calories ([place]) 11/27/2015  . History of Low serum cortisol level ([place]) 01/07/2015  . Hypothyroidism 04/25/2013  . Financial difficulty 04/25/2013  . Acute renal failure ([place]) 07/12/2012  . Uncontrolled type 2 diabetes mellitus (Br[place]) 07/12/2012  . Necrotizing fasciitis s/p OR debridements 07/06/2012  . Carpal tunnel syndrome, bilateral 01/25/2012  . Diabetic peripheral neuropathy ([place]) 07/04/2011  . Anxiety and depression 06/02/2011  . History of abnormal  cervical Pap smear 12/13/2007  . GERD 12/06/2007  . Hyperlipidemia 08/29/2007  . Hypertension associated with diabetes ([place]) 08/29/2007   Past Medical History:  Diagnosis Date  . Abdominal muscle pain 09/08/2016  . Abnormal Pap smear of cervix 2009  . Abscess    history of multiple abscesses  . Acute bilateral low back pain 02/13/2017  . Acute blood loss anemia   . Anemia of chronic disease 2002  . Anxiety    Panic attacks  . Bilateral lower extremity edema 05/13/2016  . Bipolar disorder ([place])   . Cellulitis 05/21/2014   right eye  . Chronic bronchitis ([place])    "get it q yr" (05/13/2013)  . Chronic pain   . Depression   . Edema of lower extremity   . Endocarditis 2002   subacute bacterial endocarditis.   . Family history of anesthesia complication    "  my mom has a hard time coming out from under"  . Fibromyalgia   . GERD (gastroesophageal reflux disease)    occ  . Heart murmur   . History of blood transfusion    "just low blood count" (05/13/2013)  . Hyperlipidemia   . Hypertension   . Hypothyroidism   . Hypothyroidism, adult 03/21/2014  . Leukocytosis   . Necrosis ([place])    and ulceration  . Obesity   . OSA on CPAP    does not wear CPAP  . Peripheral neuropathy   . Type II diabetes mellitus (HCC)    Type  II    Family History  Problem Relation Age of Onset  . Heart failure Mother   . Diabetes Mother   . Kidney disease Mother   . Kidney disease Father   . Diabetes Father   . Diabetes Paternal Grandmother   . Heart failure Paternal Grandmother   . Other Other     Past Surgical History:  Procedure Laterality Date  . AMPUTATION Left 04/07/2017   Procedure: LEFT BELOW KNEE AMPUTATION;  Surgeon: Newt Minion, MD;  Location: [facility];  Service: Orthopedics;  Laterality: Left;  . EYE SURGERY     lazer  . INCISION AND DRAINAGE ABSCESS     multiple I&Ds  . INCISION AND DRAINAGE ABSCESS Left 07/09/2012   Procedure: DRESSING CHANGE, THIGH WOUND;  Surgeon: Harl Bowie, MD;  Location: [facility];  Service: General;  Laterality: Left;  . INCISION AND DRAINAGE OF WOUND Left 07/07/2012   Procedure: IRRIGATION AND DEBRIDEMENT WOUND;  Surgeon: Harl Bowie, MD;  Location: [facility];  Service: General;  Laterality: Left;  . INCISION AND DRAINAGE PERIRECTAL ABSCESS Left 07/14/2012   Procedure: DEBRIDEMENT OF SKIN & SOFT TISSUE; DRESSING CHANGE UNDER ANESTHESIA;  Surgeon: Gayland Curry, MD,FACS;  Location: [facility];  Service: General;  Laterality: Left;  . INCISION AND DRAINAGE PERIRECTAL ABSCESS Left 07/16/2012   Procedure: I&D Left Thigh;  Surgeon: Gwenyth Ober, MD;  Location: [facility];  Service: General;  Laterality: Left;  . INCISION AND DRAINAGE PERIRECTAL ABSCESS N/A 01/05/2015   Procedure: IRRIGATION AND DEBRIDEMENT PERIRECTAL ABSCESS;  Surgeon: Donnie Mesa, MD;  Location: [facility];  Service: General;  Laterality: N/A;  . IRRIGATION AND DEBRIDEMENT ABSCESS Left 07/06/2012   Procedure: IRRIGATION AND DEBRIDEMENT ABSCESS BUTTOCKS AND THIGH;  Surgeon: Shann Medal, MD;  Location: [facility];  Service: General;  Laterality: Left;  . IRRIGATION AND DEBRIDEMENT ABSCESS Left 08/10/2012   Procedure: IRRIGATION AND DEBRIDEMENT ABSCESS;  Surgeon: Madilyn Hook, DO;  Location: [facility];  Service: General;  Laterality: Left;  . STUMP REVISION Left 06/09/2017   Procedure: REVISION LEFT BELOW KNEE AMPUTATION;  Surgeon: Newt Minion, MD;  Location: [facility];  Service: Orthopedics;  Laterality: Left;  . TONSILLECTOMY  1994   Social History   Occupational History  . Occupation: disability  Tobacco Use  . Smoking status: Current Every Day Smoker    Packs/day: 1.00    Years: 16.00    Pack years: 16.00    Types: Cigarettes  . Smokeless tobacco: Never Used  Substance and Sexual Activity  . Alcohol use: Yes    Alcohol/week: 0.0 standard drinks    Comment: Socially-" monthy  maybe"  . Drug use: No  . Sexual activity: Yes

## 2018-05-14 DIAGNOSIS — N189 Chronic kidney disease, unspecified: Secondary | ICD-10-CM | POA: Diagnosis not present

## 2018-05-14 DIAGNOSIS — E1142 Type 2 diabetes mellitus with diabetic polyneuropathy: Secondary | ICD-10-CM | POA: Diagnosis not present

## 2018-05-14 DIAGNOSIS — D631 Anemia in chronic kidney disease: Secondary | ICD-10-CM | POA: Diagnosis not present

## 2018-05-14 DIAGNOSIS — I129 Hypertensive chronic kidney disease with stage 1 through stage 4 chronic kidney disease, or unspecified chronic kidney disease: Secondary | ICD-10-CM | POA: Diagnosis not present

## 2018-05-14 DIAGNOSIS — E1122 Type 2 diabetes mellitus with diabetic chronic kidney disease: Secondary | ICD-10-CM | POA: Diagnosis not present

## 2018-05-14 DIAGNOSIS — M60062 Infective myositis, left lower leg: Secondary | ICD-10-CM | POA: Diagnosis not present

## 2018-05-17 DIAGNOSIS — E1142 Type 2 diabetes mellitus with diabetic polyneuropathy: Secondary | ICD-10-CM | POA: Diagnosis not present

## 2018-05-17 DIAGNOSIS — N189 Chronic kidney disease, unspecified: Secondary | ICD-10-CM | POA: Diagnosis not present

## 2018-05-17 DIAGNOSIS — E1122 Type 2 diabetes mellitus with diabetic chronic kidney disease: Secondary | ICD-10-CM | POA: Diagnosis not present

## 2018-05-17 DIAGNOSIS — D631 Anemia in chronic kidney disease: Secondary | ICD-10-CM | POA: Diagnosis not present

## 2018-05-17 DIAGNOSIS — M60062 Infective myositis, left lower leg: Secondary | ICD-10-CM | POA: Diagnosis not present

## 2018-05-17 DIAGNOSIS — I129 Hypertensive chronic kidney disease with stage 1 through stage 4 chronic kidney disease, or unspecified chronic kidney disease: Secondary | ICD-10-CM | POA: Diagnosis not present

## 2018-05-21 DIAGNOSIS — Z794 Long term (current) use of insulin: Secondary | ICD-10-CM

## 2018-05-21 DIAGNOSIS — I129 Hypertensive chronic kidney disease with stage 1 through stage 4 chronic kidney disease, or unspecified chronic kidney disease: Secondary | ICD-10-CM | POA: Diagnosis not present

## 2018-05-21 DIAGNOSIS — E1122 Type 2 diabetes mellitus with diabetic chronic kidney disease: Secondary | ICD-10-CM | POA: Diagnosis not present

## 2018-05-21 DIAGNOSIS — M60062 Infective myositis, left lower leg: Secondary | ICD-10-CM | POA: Diagnosis not present

## 2018-05-21 DIAGNOSIS — D631 Anemia in chronic kidney disease: Secondary | ICD-10-CM | POA: Diagnosis not present

## 2018-05-21 DIAGNOSIS — Z6841 Body Mass Index (BMI) 40.0 and over, adult: Secondary | ICD-10-CM

## 2018-05-21 DIAGNOSIS — Z89512 Acquired absence of left leg below knee: Secondary | ICD-10-CM | POA: Diagnosis not present

## 2018-05-21 DIAGNOSIS — F17210 Nicotine dependence, cigarettes, uncomplicated: Secondary | ICD-10-CM | POA: Diagnosis not present

## 2018-05-21 DIAGNOSIS — F319 Bipolar disorder, unspecified: Secondary | ICD-10-CM | POA: Diagnosis not present

## 2018-05-21 DIAGNOSIS — E1142 Type 2 diabetes mellitus with diabetic polyneuropathy: Secondary | ICD-10-CM | POA: Diagnosis not present

## 2018-05-21 DIAGNOSIS — N189 Chronic kidney disease, unspecified: Secondary | ICD-10-CM | POA: Diagnosis not present

## 2018-05-22 DIAGNOSIS — E1122 Type 2 diabetes mellitus with diabetic chronic kidney disease: Secondary | ICD-10-CM | POA: Diagnosis not present

## 2018-05-22 DIAGNOSIS — I129 Hypertensive chronic kidney disease with stage 1 through stage 4 chronic kidney disease, or unspecified chronic kidney disease: Secondary | ICD-10-CM | POA: Diagnosis not present

## 2018-05-22 DIAGNOSIS — E1142 Type 2 diabetes mellitus with diabetic polyneuropathy: Secondary | ICD-10-CM | POA: Diagnosis not present

## 2018-05-22 DIAGNOSIS — D631 Anemia in chronic kidney disease: Secondary | ICD-10-CM | POA: Diagnosis not present

## 2018-05-22 DIAGNOSIS — M60062 Infective myositis, left lower leg: Secondary | ICD-10-CM | POA: Diagnosis not present

## 2018-05-22 DIAGNOSIS — N189 Chronic kidney disease, unspecified: Secondary | ICD-10-CM | POA: Diagnosis not present

## 2018-05-29 DIAGNOSIS — I129 Hypertensive chronic kidney disease with stage 1 through stage 4 chronic kidney disease, or unspecified chronic kidney disease: Secondary | ICD-10-CM | POA: Diagnosis not present

## 2018-05-29 DIAGNOSIS — N189 Chronic kidney disease, unspecified: Secondary | ICD-10-CM | POA: Diagnosis not present

## 2018-05-29 DIAGNOSIS — E1142 Type 2 diabetes mellitus with diabetic polyneuropathy: Secondary | ICD-10-CM | POA: Diagnosis not present

## 2018-05-29 DIAGNOSIS — M60062 Infective myositis, left lower leg: Secondary | ICD-10-CM | POA: Diagnosis not present

## 2018-05-29 DIAGNOSIS — E1122 Type 2 diabetes mellitus with diabetic chronic kidney disease: Secondary | ICD-10-CM | POA: Diagnosis not present

## 2018-05-29 DIAGNOSIS — D631 Anemia in chronic kidney disease: Secondary | ICD-10-CM | POA: Diagnosis not present

## 2018-06-05 ENCOUNTER — Encounter: Payer: Self-pay | Admitting: Oncology

## 2018-06-07 ENCOUNTER — Encounter: Payer: Self-pay | Admitting: Internal Medicine

## 2018-06-08 ENCOUNTER — Telehealth: Payer: Self-pay | Admitting: Internal Medicine

## 2018-06-08 ENCOUNTER — Other Ambulatory Visit: Payer: Self-pay | Admitting: Internal Medicine

## 2018-06-08 DIAGNOSIS — B373 Candidiasis of vulva and vagina: Secondary | ICD-10-CM

## 2018-06-08 DIAGNOSIS — B3731 Acute candidiasis of vulva and vagina: Secondary | ICD-10-CM

## 2018-06-08 NOTE — Telephone Encounter (Signed)
Lft vm to reschedule a hem appt w/Dr. Walden Field

## 2018-06-11 ENCOUNTER — Encounter: Payer: Medicare Other | Attending: Physical Medicine & Rehabilitation | Admitting: Physical Medicine & Rehabilitation

## 2018-06-11 ENCOUNTER — Telehealth (INDEPENDENT_AMBULATORY_CARE_PROVIDER_SITE_OTHER): Payer: Self-pay | Admitting: Physician Assistant

## 2018-06-11 ENCOUNTER — Encounter (INDEPENDENT_AMBULATORY_CARE_PROVIDER_SITE_OTHER): Payer: Self-pay | Admitting: Physician Assistant

## 2018-06-11 ENCOUNTER — Ambulatory Visit (INDEPENDENT_AMBULATORY_CARE_PROVIDER_SITE_OTHER): Payer: Medicare Other | Admitting: Physician Assistant

## 2018-06-11 ENCOUNTER — Telehealth (INDEPENDENT_AMBULATORY_CARE_PROVIDER_SITE_OTHER): Payer: Self-pay

## 2018-06-11 VITALS — Ht 70.0 in | Wt 327.0 lb

## 2018-06-11 DIAGNOSIS — Z89512 Acquired absence of left leg below knee: Secondary | ICD-10-CM

## 2018-06-11 DIAGNOSIS — M1712 Unilateral primary osteoarthritis, left knee: Secondary | ICD-10-CM | POA: Diagnosis not present

## 2018-06-11 DIAGNOSIS — I890 Lymphedema, not elsewhere classified: Secondary | ICD-10-CM

## 2018-06-11 DIAGNOSIS — E1142 Type 2 diabetes mellitus with diabetic polyneuropathy: Secondary | ICD-10-CM

## 2018-06-11 DIAGNOSIS — N182 Chronic kidney disease, stage 2 (mild): Secondary | ICD-10-CM | POA: Diagnosis not present

## 2018-06-11 MED ORDER — OXYCODONE-ACETAMINOPHEN 5-325 MG PO TABS: 1.0000 | ORAL_TABLET | Freq: Three times a day (TID) | ORAL | 0 refills | Status: DC | PRN

## 2018-06-11 MED ORDER — LIDOCAINE HCL 1 % IJ SOLN
5.0000 mL | INTRAMUSCULAR | Status: AC | PRN
  Administered 2018-06-11: 5 mL

## 2018-06-11 NOTE — Telephone Encounter (Signed)
Patient called stating that the Oxycodone is on back order at the pharmacy the medication was called into.  She would like this medication sent into the [facility] on [address].  She is wanting to pick this medication up today while she has transportation.  CB#[phone].  Thank you.

## 2018-06-11 NOTE — Telephone Encounter (Signed)
Patient called advise her Rx for (Oxycodone) was not in stock and the pharmacy do not know when it will be back in stock. Patient asked if the Rx can be sent over to [facility] on [address]. The number to contact patient is [phone]

## 2018-06-11 NOTE — Telephone Encounter (Signed)
I think I may have sent a message to you about this pt but wanted to make sure pharmacy information is accurate. See message below and advise. thanks

## 2018-06-11 NOTE — Progress Notes (Signed)
Office Visit Note   Patient: [name] [name]           Date of Birth: 11-Jun-1975           MRN: [id number] Visit Date: 06/11/2018              Requested by: Ina Homes, MD [address] [place], [place] [address] PCP: Ina Homes, MD  Chief Complaint  Patient presents with  . Left Leg - Follow-up    BKA and s/p cortisone injection left knee       HPI: The patient is a 43 yo woman who is seen for left knee pain. She is S/P left below the knee amputation and has been ambulating more with her prosthesis and has known osteoarthritis of the left knee. She reports her knee is hurting with ambulation across the joint line. She does not want to loose the progress she has made with her mobility over the past couple of months. She reports a previous steroid injection helped some but did not completely resolve her pain. We are going to try a Toradol injection today.  She has been doing very well with edema of her left transtibial amputation and is going for another casting for her prosthesis as her volume continues to decrease.   Assessment & Plan: Visit Diagnoses:  1. Unilateral primary osteoarthritis, left knee   2. Status post below knee amputation, left (HCC)   3. Lymphedema of left lower extremity   4. Type 2 diabetes mellitus with diabetic polyneuropathy, unspecified whether long term insulin use ([place])   5. CKD (chronic kidney disease) stage 2, GFR 60-89 ml/min     Plan: After informed consent, the left knee was injected with a combination of lidocaine and Toradol under sterile techniques and the patient tolerated this well. She will follow up in   Follow-Up Instructions: Return in about 3 weeks (around 07/02/2018).   Ortho Exam  Patient is alert, oriented, no adenopathy, well-dressed, normal affect, normal respiratory effort. She ambulates into the clinic today with her left BKA prosthesis and a cane.  The left transtibial amputation continues to improve with decreased  volume. No signs of breakdown of infection. Pain over the knee joint line medially> laterally. No effusion appreciated. No instability.   Imaging: No results found.   Labs: Lab Results  Component Value Date   HGBA1C 15.5 (H) 04/05/2018   HGBA1C 11.4 (A) 11/30/2017   HGBA1C 9.6 09/07/2017   ESRSEDRATE 130 (H) 03/24/2017   ESRSEDRATE 53 (H) 09/14/2015   ESRSEDRATE 132 (H) 07/06/2012   CRP 38.6 (H) 04/03/2018   CRP 5.4 (H) 09/14/2015   CRP 23.0 (H) 07/06/2012   REPTSTATUS 04/10/2018 FINAL 04/05/2018   REPTSTATUS 04/10/2018 FINAL 04/05/2018   GRAMSTAIN  08/11/2017    MODERATE WBC PRESENT, PREDOMINANTLY PMN FEW GRAM POSITIVE COCCI    CULT  04/05/2018    NO GROWTH 5 DAYS Performed at [facility], [place] [address], [place], [place] [address]    CULT  04/05/2018    NO GROWTH 5 DAYS Performed at [facility] [address] [place], [place] [address]    LABORGA GROUP B STREP(S.AGALACTIAE)ISOLATED 04/03/2018     Lab Results  Component Value Date   ALBUMIN <1.0 (L) 04/11/2018   ALBUMIN 1.6 (L) 04/03/2018   ALBUMIN 1.9 (L) 08/11/2017    Body mass index is 46.92 kg/m.  Orders:  Orders Placed This Encounter  Procedures  . Large Joint Inj: L knee  Meds ordered this encounter  Medications  . DISCONTD: oxyCODONE-acetaminophen (PERCOCET/ROXICET) 5-325 MG tablet    Sig: Take 1-2 tablets by mouth every 8 (eight) hours as needed for moderate pain or severe pain.    Dispense:  30 tablet    Refill:  0  . oxyCODONE-acetaminophen (PERCOCET/ROXICET) 5-325 MG tablet    Sig: Take 1-2 tablets by mouth every 8 (eight) hours as needed for moderate pain or severe pain.    Dispense:  30 tablet    Refill:  0     Procedures: Large Joint Inj: L knee on 06/11/2018 9:38 AM Indications: pain and diagnostic evaluation Details: 22 G 1.5 in needle, anteromedial approach  Arthrogram: No  Medications: 5 mL lidocaine 1 % Outcome: tolerated well, no immediate  complications  Ketolorac Tromethamine 30mg /56ml injected with lidocaine to the left knee and the patient tolerated this well.  Lot [id number]  Expiration 12/2019 Procedure, treatment alternatives, risks and benefits explained, specific risks discussed. Consent was given by the patient. Immediately prior to procedure a time out was called to verify the correct patient, procedure, equipment, support staff and site/side marked as required. Patient was prepped and draped in the usual sterile fashion.    Ketolorac Tromethamine 30mg /13ml injected with lidocaine to the left knee and the patient tolerated this well.  Lot [id number]  Expiration 12/2019 Clinical Data: No additional findings.  ROS:  All other systems negative, except as noted in the HPI. Review of Systems  Objective: Vital Signs: Ht 5\' 10"  (1.778 m)   Wt (!) 327 lb (148.3 kg)   BMI 46.92 kg/m   Specialty Comments:  No specialty comments available.  PMFS History: Patient Active Problem List   Diagnosis Date Noted  . AKI (acute kidney injury) ([place])   . Current moderate episode of major depressive disorder ([place])   . Morbid obesity ([place])   . Anemia of chronic disease   . Peripheral edema   . Essential hypertension   . Diabetes mellitus type 2 in obese ([place])   . Bacteremia   . Debility 04/10/2018  . Bacteremia due to group B Streptococcus 04/04/2018  . Severe protein-calorie malnutrition ([place])   . BKA stump complication ([place]) 58/01/9832  . Vaginal candidiasis 03/12/2018  . Herpes genitalis in women 01/31/2018  . Herpes simplex type 1 infection 01/16/2018  . Status post below knee amputation, left ([place]) 04/11/2017  . Bipolar affective disorder ([place])   . Adjustment disorder with depressed mood 03/26/2017  . Abnormal ECG 03/24/2017  . Sepsis ([place]) 03/24/2017  . Heart failure with preserved ejection fraction ([place]) 02/09/2017  . Routine adult health maintenance 12/08/2016  . Chronic pain of right knee 09/08/2016  . Chronic  kidney disease (CKD), stage III (moderate) ([place]) 07/15/2016  . OSA (obstructive sleep apnea) 05/13/2016  . Morbid obesity due to excess calories ([place]) 11/27/2015  . History of Low serum cortisol level ([place]) 01/07/2015  . Hypothyroidism 04/25/2013  . Financial difficulty 04/25/2013  . Acute renal failure ([place]) 07/12/2012  . Uncontrolled type 2 diabetes mellitus ([place]) 07/12/2012  . Necrotizing fasciitis s/p OR debridements 07/06/2012  . Carpal tunnel syndrome, bilateral 01/25/2012  . Diabetic peripheral neuropathy ([place]) 07/04/2011  . Anxiety and depression 06/02/2011  . History of abnormal cervical Pap smear 12/13/2007  . GERD 12/06/2007  . Hyperlipidemia 08/29/2007  . Hypertension associated with diabetes ([place]) 08/29/2007   Past Medical History:  Diagnosis Date  . Abdominal muscle pain 09/08/2016  . Abnormal Pap smear of cervix 2009  . Abscess  history of multiple abscesses  . Acute bilateral low back pain 02/13/2017  . Acute blood loss anemia   . Anemia of chronic disease 2002  . Anxiety    Panic attacks  . Bilateral lower extremity edema 05/13/2016  . Bipolar disorder ([place])   . Cellulitis 05/21/2014   right eye  . Chronic bronchitis ([place])    "get it q yr" (05/13/2013)  . Chronic pain   . Depression   . Edema of lower extremity   . Endocarditis 2002   subacute bacterial endocarditis.   . Family history of anesthesia complication    "my mom has a hard time coming out from under"  . Fibromyalgia   . GERD (gastroesophageal reflux disease)    occ  . Heart murmur   . History of blood transfusion    "just low blood count" (05/13/2013)  . Hyperlipidemia   . Hypertension   . Hypothyroidism   . Hypothyroidism, adult 03/21/2014  . Leukocytosis   . Necrosis ([place])    and ulceration  . Obesity   . OSA on CPAP    does not wear CPAP  . Peripheral neuropathy   . Type II diabetes mellitus (HCC)    Type  II    Family History  Problem Relation Age of Onset  . Heart failure  Mother   . Diabetes Mother   . Kidney disease Mother   . Kidney disease Father   . Diabetes Father   . Diabetes Paternal Grandmother   . Heart failure Paternal Grandmother   . Other Other     Past Surgical History:  Procedure Laterality Date  . AMPUTATION Left 04/07/2017   Procedure: LEFT BELOW KNEE AMPUTATION;  Surgeon: Newt Minion, MD;  Location: [facility];  Service: Orthopedics;  Laterality: Left;  . EYE SURGERY     lazer  . INCISION AND DRAINAGE ABSCESS     multiple I&Ds  . INCISION AND DRAINAGE ABSCESS Left 07/09/2012   Procedure: DRESSING CHANGE, THIGH WOUND;  Surgeon: Harl Bowie, MD;  Location: [facility];  Service: General;  Laterality: Left;  . INCISION AND DRAINAGE OF WOUND Left 07/07/2012   Procedure: IRRIGATION AND DEBRIDEMENT WOUND;  Surgeon: Harl Bowie, MD;  Location: [facility];  Service: General;  Laterality: Left;  . INCISION AND DRAINAGE PERIRECTAL ABSCESS Left 07/14/2012   Procedure: DEBRIDEMENT OF SKIN & SOFT TISSUE; DRESSING CHANGE UNDER ANESTHESIA;  Surgeon: [place] Curry, MD,FACS;  Location: [facility];  Service: General;  Laterality: Left;  . INCISION AND DRAINAGE PERIRECTAL ABSCESS Left 07/16/2012   Procedure: I&D Left Thigh;  Surgeon: Gwenyth Ober, MD;  Location: [facility];  Service: General;  Laterality: Left;  . INCISION AND DRAINAGE PERIRECTAL ABSCESS N/A 01/05/2015   Procedure: IRRIGATION AND DEBRIDEMENT PERIRECTAL ABSCESS;  Surgeon: Donnie Mesa, MD;  Location: [facility];  Service: General;  Laterality: N/A;  . IRRIGATION AND DEBRIDEMENT ABSCESS Left 07/06/2012   Procedure: IRRIGATION AND DEBRIDEMENT ABSCESS BUTTOCKS AND THIGH;  Surgeon: Shann Medal, MD;  Location: [facility];  Service: General;  Laterality: Left;  . IRRIGATION AND DEBRIDEMENT ABSCESS Left 08/10/2012   Procedure: IRRIGATION AND DEBRIDEMENT ABSCESS;  Surgeon: Madilyn Hook, DO;  Location: [facility];  Service: General;  Laterality: Left;  . STUMP REVISION Left 06/09/2017   Procedure: REVISION LEFT BELOW KNEE  AMPUTATION;  Surgeon: Newt Minion, MD;  Location: [facility];  Service: Orthopedics;  Laterality: Left;  . TONSILLECTOMY  1994   Social History   Occupational History  .  Occupation: disability  Tobacco Use  . Smoking status: Current Every Day Smoker    Packs/day: 1.00    Years: 16.00    Pack years: 16.00    Types: Cigarettes  . Smokeless tobacco: Never Used  Substance and Sexual Activity  . Alcohol use: Yes    Alcohol/week: 0.0 standard drinks    Comment: Socially-" monthy  maybe"  . Drug use: No  . Sexual activity: Yes

## 2018-06-11 NOTE — Telephone Encounter (Signed)
Sent orders to the [facility] on [address] as directed.

## 2018-06-11 NOTE — Telephone Encounter (Signed)
I called [name] [name] and she states that [facility] was correct pharm and will pick up today. Will call with any questions.

## 2018-06-11 NOTE — Telephone Encounter (Signed)
Jarrett Soho, pharmacist at [facility] called stating Rx for Oxycodone 5-325 needs to be sent to [facility] on [address] due to not having it in stock at there location.  Stated that patient is aware of this and this is the pharmacy patient would like for Rx to be filled.  Cb# is [phone].  Please advise.  Thank you.

## 2018-06-11 NOTE — Telephone Encounter (Signed)
Please see message below

## 2018-06-18 ENCOUNTER — Other Ambulatory Visit: Payer: Self-pay | Admitting: Internal Medicine

## 2018-06-18 NOTE — Telephone Encounter (Signed)
Last rx written 05/01/18. Last OV 10/21 with Dr Berline Lopes. Next OV  06/28/18.

## 2018-06-18 NOTE — Telephone Encounter (Signed)
Needs refill on   traMADol (ULTRAM) 50 MG tablet VICTOZA 18 MG/3ML SOPN  She needs all her medicine updated per pharmacy [address],  - [place], [place] - [address]  [phone]

## 2018-06-19 MED ORDER — LIRAGLUTIDE 18 MG/3ML ~~LOC~~ SOPN: PEN_INJECTOR | SUBCUTANEOUS | 2 refills | Status: DC

## 2018-06-23 ENCOUNTER — Other Ambulatory Visit: Payer: Self-pay | Admitting: Internal Medicine

## 2018-06-23 DIAGNOSIS — E785 Hyperlipidemia, unspecified: Secondary | ICD-10-CM

## 2018-06-25 NOTE — Telephone Encounter (Signed)
Next appt scheduled 2/6 with PCP.

## 2018-06-28 ENCOUNTER — Other Ambulatory Visit: Payer: Self-pay

## 2018-06-28 ENCOUNTER — Encounter: Payer: Self-pay | Admitting: Internal Medicine

## 2018-06-28 ENCOUNTER — Ambulatory Visit (INDEPENDENT_AMBULATORY_CARE_PROVIDER_SITE_OTHER): Payer: Medicare Other | Admitting: Internal Medicine

## 2018-06-28 VITALS — BP 177/104 | Ht 70.0 in | Wt 294.1 lb

## 2018-06-28 DIAGNOSIS — Z6841 Body Mass Index (BMI) 40.0 and over, adult: Secondary | ICD-10-CM

## 2018-06-28 DIAGNOSIS — F32A Depression, unspecified: Secondary | ICD-10-CM

## 2018-06-28 DIAGNOSIS — F419 Anxiety disorder, unspecified: Secondary | ICD-10-CM | POA: Diagnosis not present

## 2018-06-28 DIAGNOSIS — I152 Hypertension secondary to endocrine disorders: Secondary | ICD-10-CM

## 2018-06-28 DIAGNOSIS — E785 Hyperlipidemia, unspecified: Secondary | ICD-10-CM

## 2018-06-28 DIAGNOSIS — I10 Essential (primary) hypertension: Secondary | ICD-10-CM

## 2018-06-28 DIAGNOSIS — E1165 Type 2 diabetes mellitus with hyperglycemia: Secondary | ICD-10-CM | POA: Diagnosis not present

## 2018-06-28 DIAGNOSIS — Z89512 Acquired absence of left leg below knee: Secondary | ICD-10-CM | POA: Diagnosis not present

## 2018-06-28 DIAGNOSIS — E1142 Type 2 diabetes mellitus with diabetic polyneuropathy: Secondary | ICD-10-CM | POA: Diagnosis not present

## 2018-06-28 DIAGNOSIS — E1159 Type 2 diabetes mellitus with other circulatory complications: Secondary | ICD-10-CM

## 2018-06-28 DIAGNOSIS — F329 Major depressive disorder, single episode, unspecified: Secondary | ICD-10-CM | POA: Diagnosis not present

## 2018-06-28 DIAGNOSIS — Z23 Encounter for immunization: Secondary | ICD-10-CM

## 2018-06-28 DIAGNOSIS — Z794 Long term (current) use of insulin: Secondary | ICD-10-CM

## 2018-06-28 DIAGNOSIS — E11649 Type 2 diabetes mellitus with hypoglycemia without coma: Secondary | ICD-10-CM

## 2018-06-28 LAB — POCT GLYCOSYLATED HEMOGLOBIN (HGB A1C): Hemoglobin A1C: 6.5 % — AB (ref 4.0–5.6)

## 2018-06-28 LAB — GLUCOSE, CAPILLARY
Glucose-Capillary: 52 mg/dL — ABNORMAL LOW (ref 70–99)
Glucose-Capillary: 80 mg/dL (ref 70–99)

## 2018-06-28 MED ORDER — OXYCODONE-ACETAMINOPHEN 5-325 MG PO TABS: 1.0000 | ORAL_TABLET | Freq: Three times a day (TID) | ORAL | 0 refills | Status: DC | PRN

## 2018-06-28 NOTE — Progress Notes (Signed)
Hypoglycemic Event  CBG: 59  Treatment: Apple Juice, Glucose Gel  Symptoms: Little tired feeling  Follow-up CBG: Time:14:19 CBG Result: 80 Possible Reasons for Event: Did not eat lunch only breakfast   Comments/MD notified: Dr. Maia Plan, Trey Sailors

## 2018-06-28 NOTE — Patient Instructions (Signed)
Great work on your diabetes and weight loss! You are doing a great job. Keep it up.   Today we are going to check some urine to see if we need to start you on a medication to protect your kidneys. Please start taking your blood pressure medication consistently.   I will send out a short course of Percocet for your knee pain. I think this is likely due to your ill fitting prosthetic.   I will see you back in 3 months or sooner if anything arises.

## 2018-06-28 NOTE — Progress Notes (Signed)
   CC: F/U DM, HTN, Left BKA  HPI:  [name] [name] is a 43 y.o. female with PMHx listed below presenting for F/U DM, HTN, Left BKA. Please see the A&P for the status of the patient's chronic medical problems.  Past Medical History:  Diagnosis Date  . Abdominal muscle pain 09/08/2016  . Abnormal Pap smear of cervix 2009  . Abscess    history of multiple abscesses  . Acute bilateral low back pain 02/13/2017  . Acute blood loss anemia   . Acute renal failure ([place]) 07/12/2012  . AKI (acute kidney injury) ([place])   . Anemia of chronic disease 2002  . Anxiety    Panic attacks  . Bilateral lower extremity edema 05/13/2016  . Bipolar disorder ([place])   . Cellulitis 05/21/2014   right eye  . Chronic bronchitis ([place])    "get it q yr" (05/13/2013)  . Chronic pain   . Depression   . Edema of lower extremity   . Endocarditis 2002   subacute bacterial endocarditis.   . Family history of anesthesia complication    "my mom has a hard time coming out from under"  . Fibromyalgia   . GERD (gastroesophageal reflux disease)    occ  . Heart murmur   . History of blood transfusion    "just low blood count" (05/13/2013)  . Hyperlipidemia   . Hypertension   . Hypothyroidism   . Hypothyroidism, adult 03/21/2014  . Leukocytosis   . Necrosis ([place])    and ulceration  . Obesity   . OSA on CPAP    does not wear CPAP  . Peripheral neuropathy   . Type II diabetes mellitus ([place])    Type  II   Review of Systems: Performed and all others negative.  Physical Exam: Vitals:   06/28/18 1349  BP: (!) 177/104  SpO2: 100%  Weight: 294 lb 1.6 oz (133.4 kg)  Height: 5\' 10"  (1.778 m)   General: obese female in no acute distress Pulm: Good air movement with no wheezing or crackles  CV: RRR, no murmurs, no rubs   Assessment & Plan:   See Encounters Tab for problem based charting.  Patient discussed with Dr. Eppie Gibson

## 2018-06-29 ENCOUNTER — Encounter: Payer: Self-pay | Admitting: Internal Medicine

## 2018-06-29 ENCOUNTER — Encounter (INDEPENDENT_AMBULATORY_CARE_PROVIDER_SITE_OTHER): Payer: Self-pay | Admitting: Physician Assistant

## 2018-06-29 ENCOUNTER — Ambulatory Visit (INDEPENDENT_AMBULATORY_CARE_PROVIDER_SITE_OTHER): Payer: Medicare Other | Admitting: Physician Assistant

## 2018-06-29 ENCOUNTER — Other Ambulatory Visit: Payer: Self-pay | Admitting: Internal Medicine

## 2018-06-29 ENCOUNTER — Telehealth (INDEPENDENT_AMBULATORY_CARE_PROVIDER_SITE_OTHER): Payer: Self-pay | Admitting: Physician Assistant

## 2018-06-29 VITALS — Ht 70.0 in | Wt 294.1 lb

## 2018-06-29 DIAGNOSIS — M170 Bilateral primary osteoarthritis of knee: Secondary | ICD-10-CM | POA: Diagnosis not present

## 2018-06-29 DIAGNOSIS — I890 Lymphedema, not elsewhere classified: Secondary | ICD-10-CM

## 2018-06-29 DIAGNOSIS — E1159 Type 2 diabetes mellitus with other circulatory complications: Secondary | ICD-10-CM

## 2018-06-29 DIAGNOSIS — Z89512 Acquired absence of left leg below knee: Secondary | ICD-10-CM | POA: Diagnosis not present

## 2018-06-29 DIAGNOSIS — N182 Chronic kidney disease, stage 2 (mild): Secondary | ICD-10-CM

## 2018-06-29 DIAGNOSIS — E1142 Type 2 diabetes mellitus with diabetic polyneuropathy: Secondary | ICD-10-CM

## 2018-06-29 DIAGNOSIS — E1165 Type 2 diabetes mellitus with hyperglycemia: Secondary | ICD-10-CM

## 2018-06-29 DIAGNOSIS — I10 Essential (primary) hypertension: Principal | ICD-10-CM

## 2018-06-29 LAB — LIPID PANEL
CHOL/HDL RATIO: 3.9 ratio (ref 0.0–4.4)
Cholesterol, Total: 193 mg/dL (ref 100–199)
HDL: 49 mg/dL (ref >39)
LDL Calculated: 122 mg/dL — ABNORMAL HIGH (ref 0–99)
Triglycerides: 111 mg/dL (ref 0–149)
VLDL Cholesterol Cal: 22 mg/dL (ref 5–40)

## 2018-06-29 LAB — MICROALBUMIN / CREATININE URINE RATIO
Creatinine, Urine: 119.1 mg/dL
Microalb/Creat Ratio: 5447 mg/g creat — ABNORMAL HIGH (ref 0–29)
Microalbumin, Urine: 6487.8 ug/mL

## 2018-06-29 MED ORDER — POLYSACCHARIDE IRON COMPLEX 150 MG PO CAPS: 150.0000 mg | ORAL_CAPSULE | Freq: Two times a day (BID) | ORAL | 3 refills | Status: DC

## 2018-06-29 MED ORDER — AMLODIPINE BESYLATE 10 MG PO TABS: 10.0000 mg | ORAL_TABLET | Freq: Every day | ORAL | 3 refills | Status: DC

## 2018-06-29 MED ORDER — FLUOXETINE HCL 20 MG/5ML PO SOLN: 60.0000 mg | Freq: Every day | ORAL | 3 refills | Status: DC

## 2018-06-29 MED ORDER — LIDOCAINE HCL (PF) 1 % IJ SOLN
5.0000 mL | INTRAMUSCULAR | Status: AC | PRN
  Administered 2018-06-29: 5 mL

## 2018-06-29 MED ORDER — METHYLPREDNISOLONE ACETATE 40 MG/ML IJ SUSP
40.0000 mg | INTRAMUSCULAR | Status: AC | PRN
  Administered 2018-06-29: 40 mg via INTRA_ARTICULAR

## 2018-06-29 MED ORDER — ATORVASTATIN CALCIUM 20 MG PO TABS: 20.0000 mg | ORAL_TABLET | Freq: Every day | ORAL | 3 refills | Status: DC

## 2018-06-29 MED ORDER — LIRAGLUTIDE 18 MG/3ML ~~LOC~~ SOPN: PEN_INJECTOR | SUBCUTANEOUS | 3 refills | Status: DC

## 2018-06-29 MED ORDER — INSULIN GLARGINE 100 UNIT/ML ~~LOC~~ SOLN: 3.0000 [IU] | Freq: Two times a day (BID) | SUBCUTANEOUS | 11 refills | Status: DC

## 2018-06-29 MED ORDER — METOPROLOL TARTRATE 25 MG PO TABS: 12.5000 mg | ORAL_TABLET | Freq: Two times a day (BID) | ORAL | 3 refills | Status: DC

## 2018-06-29 MED ORDER — LISINOPRIL 10 MG PO TABS: 10.0000 mg | ORAL_TABLET | Freq: Every day | ORAL | 2 refills | Status: DC

## 2018-06-29 NOTE — Assessment & Plan Note (Signed)
HPI: Patient is having some pain s/p left BKA. She was on short course of oxycodone which helped. Her prosthesis is too big and rubbing her leg. She has an appointment to be refitted on 07/02/2018. She is requesting a short course to help her with the pain while she awaits the appointment.   A/P: Patient will follow up for refitting of her prosthesis on 07/02/2018. Will send out 5 days of oxycodone.

## 2018-06-29 NOTE — Assessment & Plan Note (Signed)
HPI:  Patient with known HTN. Currently on Amlodipine 10 mg QD and Metoprolol 25 mg BID. Her BP is elevated today but she has not been taking her medications consistently. She did not take her medications today.   A/P:  Encouraged patient to take her BP medications as prescribed. She would likely benefit from an ACE/ARB given prior proteinuria. Will check urine microalbumin.

## 2018-06-29 NOTE — Progress Notes (Signed)
Office Visit Note   Patient: [name] [name]           Date of Birth: 1975/09/23           MRN: [id number] Visit Date: 06/29/2018              Requested by: Ina Homes, MD [address] [place], [place] [address] PCP: Ina Homes, MD  Chief Complaint  Patient presents with  . Left Knee - Follow-up      HPI: The patient is a 43 yo woman who is seen for follow up of her left transtibial amputation and bilateral knee osteoarthritis. She reports both of her knees are causing significant pain and she would like steroid injections of both knees. She also reports she has to follow up with [facility] as she is now wearing multiple socks to keep her prosthesis fitting. She has no breakdown over the area.   Assessment & Plan: Visit Diagnoses:  1. Status post below knee amputation, left (HCC)   2. Lymphedema of left lower extremity   3. Primary osteoarthritis of both knees   4. Type 2 diabetes mellitus with diabetic polyneuropathy, unspecified whether long term insulin use ([place])   5. CKD (chronic kidney disease) stage 2, GFR 60-89 ml/min     Plan: Bilateral knees were injected with lidocaine and Depo medrol and the patient tolerated this well. She will follow up with [facility] for adjustment to socket as currently needing to wear a total of ~ 25 ply to fit . She will follow up in 4 weeks.   Follow-Up Instructions: Return in about 4 weeks (around 07/27/2018).   Ortho Exam  Patient is alert, oriented, no adenopathy, well-dressed, normal affect, normal respiratory effort. Bilateral knees range of motion is good and full, but painful. Bilateral knees injected with steroids and she tolerated this well. She has no breakdown over the residual limb of the left transtibial amputation site. Ambulates with prosthesis with antalgic appearing gait.   Imaging: No results found. No images are attached to the encounter.  Labs: Lab Results  Component Value Date   HGBA1C 6.5 (A)  06/28/2018   HGBA1C 15.5 (H) 04/05/2018   HGBA1C 11.4 (A) 11/30/2017   ESRSEDRATE 130 (H) 03/24/2017   ESRSEDRATE 53 (H) 09/14/2015   ESRSEDRATE 132 (H) 07/06/2012   CRP 38.6 (H) 04/03/2018   CRP 5.4 (H) 09/14/2015   CRP 23.0 (H) 07/06/2012   REPTSTATUS 04/10/2018 FINAL 04/05/2018   REPTSTATUS 04/10/2018 FINAL 04/05/2018   GRAMSTAIN  08/11/2017    MODERATE WBC PRESENT, PREDOMINANTLY PMN FEW GRAM POSITIVE COCCI    CULT  04/05/2018    NO GROWTH 5 DAYS Performed at [facility], [place] [address] [place], [place] [address]    CULT  04/05/2018    NO GROWTH 5 DAYS Performed at [facility] [address] [place], [place] [address]    LABORGA GROUP B STREP(S.AGALACTIAE)ISOLATED 04/03/2018     Lab Results  Component Value Date   ALBUMIN <1.0 (L) 04/11/2018   ALBUMIN 1.6 (L) 04/03/2018   ALBUMIN 1.9 (L) 08/11/2017    Body mass index is 42.2 kg/m.  Orders:  Orders Placed This Encounter  Procedures  . Large Joint Inj   No orders of the defined types were placed in this encounter.    Procedures: Large Joint Inj: bilateral knee on 06/29/2018 11:05 AM Indications: pain and diagnostic evaluation Details: 22 G 1.5 in needle, anteromedial approach  Arthrogram: No  Medications (Right): 5 mL lidocaine (PF) 1 %; 40 mg methylPREDNISolone acetate 40 MG/ML Medications (Left): 5 mL lidocaine (PF) 1 %; 40 mg methylPREDNISolone acetate 40 MG/ML Outcome: tolerated well, no immediate complications Procedure, treatment alternatives, risks and benefits explained, specific risks discussed. Consent was given by the patient. Immediately prior to procedure a time out was called to verify the correct patient, procedure, equipment, support staff and site/side marked as required. Patient was prepped and draped in the usual sterile fashion.      Clinical Data: No additional findings.  ROS:  All other systems negative, except as noted in the HPI. Review of  Systems  Objective: Vital Signs: Ht 5\' 10"  (1.778 m)   Wt 294 lb 1.6 oz (133.4 kg)   BMI 42.20 kg/m   Specialty Comments:  No specialty comments available.  PMFS History: Patient Active Problem List   Diagnosis Date Noted  . Current moderate episode of major depressive disorder ([place])   . Morbid obesity ([place])   . Anemia of chronic disease   . Debility 04/10/2018  . Bacteremia due to group B Streptococcus 04/04/2018  . Severe protein-calorie malnutrition ([place])   . BKA stump complication ([place]) 51/06/5850  . Herpes genitalis in women 01/31/2018  . Herpes simplex type 1 infection 01/16/2018  . Status post below knee amputation, left ([place]) 04/11/2017  . Bipolar affective disorder ([place])   . Adjustment disorder with depressed mood 03/26/2017  . Abnormal ECG 03/24/2017  . Heart failure with preserved ejection fraction ([place]) 02/09/2017  . Routine adult health maintenance 12/08/2016  . Chronic pain of right knee 09/08/2016  . Chronic kidney disease (CKD), stage III (moderate) ([place]) 07/15/2016  . OSA (obstructive sleep apnea) 05/13/2016  . Morbid obesity due to excess calories ([place]) 11/27/2015  . History of Low serum cortisol level ([place]) 01/07/2015  . Hypothyroidism 04/25/2013  . Financial difficulty 04/25/2013  . Uncontrolled type 2 diabetes mellitus ([place]) 07/12/2012  . Necrotizing fasciitis s/p OR debridements 07/06/2012  . Carpal tunnel syndrome, bilateral 01/25/2012  . Diabetic peripheral neuropathy ([place]) 07/04/2011  . Anxiety and depression 06/02/2011  . History of abnormal cervical Pap smear 12/13/2007  . GERD 12/06/2007  . Hyperlipidemia 08/29/2007  . Hypertension associated with diabetes ([place]) 08/29/2007   Past Medical History:  Diagnosis Date  . Abdominal muscle pain 09/08/2016  . Abnormal Pap smear of cervix 2009  . Abscess    history of multiple abscesses  . Acute bilateral low back pain 02/13/2017  . Acute blood loss anemia   . Acute renal failure ([place]) 07/12/2012   . AKI (acute kidney injury) ([place])   . Anemia of chronic disease 2002  . Anxiety    Panic attacks  . Bilateral lower extremity edema 05/13/2016  . Bipolar disorder ([place])   . Cellulitis 05/21/2014   right eye  . Chronic bronchitis ([place])    "get it q yr" (05/13/2013)  . Chronic pain   . Depression   . Edema of lower extremity   . Endocarditis 2002   subacute bacterial endocarditis.   . Family history of anesthesia complication    "my mom has a hard time coming out from under"  . Fibromyalgia   . GERD (gastroesophageal reflux disease)    occ  . Heart murmur   . History of blood transfusion    "just low blood count" (05/13/2013)  . Hyperlipidemia   . Hypertension   . Hypothyroidism   . Hypothyroidism, adult 03/21/2014  . Leukocytosis   . Necrosis ([place])  and ulceration  . Obesity   . OSA on CPAP    does not wear CPAP  . Peripheral neuropathy   . Type II diabetes mellitus (HCC)    Type  II    Family History  Problem Relation Age of Onset  . Heart failure Mother   . Diabetes Mother   . Kidney disease Mother   . Kidney disease Father   . Diabetes Father   . Diabetes Paternal Grandmother   . Heart failure Paternal Grandmother   . Other Other     Past Surgical History:  Procedure Laterality Date  . AMPUTATION Left 04/07/2017   Procedure: LEFT BELOW KNEE AMPUTATION;  Surgeon: Newt Minion, MD;  Location: [facility];  Service: Orthopedics;  Laterality: Left;  . EYE SURGERY     [place]  . INCISION AND DRAINAGE ABSCESS     multiple I&Ds  . INCISION AND DRAINAGE ABSCESS Left 07/09/2012   Procedure: DRESSING CHANGE, THIGH WOUND;  Surgeon: Harl Bowie, MD;  Location: [facility];  Service: General;  Laterality: Left;  . INCISION AND DRAINAGE OF WOUND Left 07/07/2012   Procedure: IRRIGATION AND DEBRIDEMENT WOUND;  Surgeon: Harl Bowie, MD;  Location: [facility];  Service: General;  Laterality: Left;  . INCISION AND DRAINAGE PERIRECTAL ABSCESS Left 07/14/2012   Procedure:  DEBRIDEMENT OF SKIN & SOFT TISSUE; DRESSING CHANGE UNDER ANESTHESIA;  Surgeon: Gayland Curry, MD,FACS;  Location: [facility];  Service: General;  Laterality: Left;  . INCISION AND DRAINAGE PERIRECTAL ABSCESS Left 07/16/2012   Procedure: I&D Left Thigh;  Surgeon: Gwenyth Ober, MD;  Location: [facility];  Service: General;  Laterality: Left;  . INCISION AND DRAINAGE PERIRECTAL ABSCESS N/A 01/05/2015   Procedure: IRRIGATION AND DEBRIDEMENT PERIRECTAL ABSCESS;  Surgeon: Donnie Mesa, MD;  Location: [facility];  Service: General;  Laterality: N/A;  . IRRIGATION AND DEBRIDEMENT ABSCESS Left 07/06/2012   Procedure: IRRIGATION AND DEBRIDEMENT ABSCESS BUTTOCKS AND THIGH;  Surgeon: Shann Medal, MD;  Location: [facility];  Service: General;  Laterality: Left;  . IRRIGATION AND DEBRIDEMENT ABSCESS Left 08/10/2012   Procedure: IRRIGATION AND DEBRIDEMENT ABSCESS;  Surgeon: Madilyn Hook, DO;  Location: [facility];  Service: General;  Laterality: Left;  . STUMP REVISION Left 06/09/2017   Procedure: REVISION LEFT BELOW KNEE AMPUTATION;  Surgeon: Newt Minion, MD;  Location: [facility];  Service: Orthopedics;  Laterality: Left;  . TONSILLECTOMY  1994   Social History   Occupational History  . Occupation: disability  Tobacco Use  . Smoking status: Current Every Day Smoker    Packs/day: 0.50    Years: 16.00    Pack years: 8.00    Types: Cigarettes  . Smokeless tobacco: Never Used  Substance and Sexual Activity  . Alcohol use: Yes    Alcohol/week: 0.0 standard drinks    Comment: Socially-" monthy  maybe"  . Drug use: No  . Sexual activity: Yes

## 2018-06-29 NOTE — Telephone Encounter (Signed)
[name] [name] from [facility] called stated patient was looking for a Rx for Percocet that was to be sent there.  Please call [facility] @ [phone]

## 2018-06-29 NOTE — Assessment & Plan Note (Signed)
HPI:  Patient with previously uncontrolled DM. Since being discharged from the hospital she has started taking all her DM medications as prescribed. She is on Victoza and Lantus. She has started walking more since getting her prosthesis. She is watching what she eats and trying to avoid high carb foods and candy.   A/P: Repeat A1c today down to 6.5 from 15.5. Weight is down >20 lbs. Encouraged the patient to keep up the good work. Will continue Victoza and Lantus as prescribed.

## 2018-06-29 NOTE — Addendum Note (Signed)
Addended byIna Homes T on: 06/29/2018 11:58 AM   Modules accepted: Orders

## 2018-06-29 NOTE — Progress Notes (Signed)
Elevated microalbumin. Starting ACE-inhibitor.

## 2018-06-29 NOTE — Telephone Encounter (Signed)
Called and lm on vm to advise that per Shawn disregard the rx for percocet today want to decline this rx as the pt received a rx from another provider yesterday.

## 2018-06-29 NOTE — Progress Notes (Signed)
Case discussed with Dr. Helberg at the time of the visit.  We reviewed the resident's history and exam and pertinent patient test results.  I agree with the assessment, diagnosis and plan of care documented in the resident's note. 

## 2018-07-03 ENCOUNTER — Telehealth (INDEPENDENT_AMBULATORY_CARE_PROVIDER_SITE_OTHER): Payer: Self-pay | Admitting: Physician Assistant

## 2018-07-03 NOTE — Telephone Encounter (Signed)
Patient called requesting an RX refill on her Percocet.  She is needing this done this morning while her aid is there so the aid can go pick it up for her.  She uses [facility] on [address].  [phone].  Thank you.

## 2018-07-03 NOTE — Telephone Encounter (Signed)
Too soon for refill.

## 2018-07-03 NOTE — Telephone Encounter (Signed)
Cannot fill for another 2 days with current federal regulations... She just got medication on 06/29/2018.

## 2018-07-03 NOTE — Telephone Encounter (Signed)
Pt is asking for refill on percocet 5/325. Last refill was 06/28/2018 #30 pt is a BKA

## 2018-07-03 NOTE — Telephone Encounter (Signed)
Patient called in regards to getting refill of Perocet called in so she could have her aide pick it up while she was there. Patient has no transportation.  Please cal patient to advise.  [phone]

## 2018-07-05 NOTE — Telephone Encounter (Signed)
Pt's sister answered phone and advised she will have pt call the office back. Will advise no refill at that time.

## 2018-07-06 ENCOUNTER — Other Ambulatory Visit (INDEPENDENT_AMBULATORY_CARE_PROVIDER_SITE_OTHER): Payer: Self-pay | Admitting: Physician Assistant

## 2018-07-06 DIAGNOSIS — Z89512 Acquired absence of left leg below knee: Secondary | ICD-10-CM

## 2018-07-06 MED ORDER — OXYCODONE-ACETAMINOPHEN 5-325 MG PO TABS: 1.0000 | ORAL_TABLET | Freq: Four times a day (QID) | ORAL | 0 refills | Status: DC | PRN

## 2018-07-06 NOTE — Telephone Encounter (Signed)
Called and pt's sister advised hat she was not in and that she would call back.

## 2018-07-10 ENCOUNTER — Ambulatory Visit: Payer: Self-pay | Admitting: Licensed Clinical Social Worker

## 2018-07-10 NOTE — Telephone Encounter (Signed)
Pt is s/p a revision BKA 06/09/2017 she is requesting refill on Oxycodone 5/325 #30 last refill was 06/28/2018. Is this something that you are going to continue to refill or do you want to fill this time and make referral to pain management?

## 2018-07-10 NOTE — Telephone Encounter (Signed)
Lets see her in the office, or refer to pain clinic, I cannot continue refilling narcotics

## 2018-07-10 NOTE — Telephone Encounter (Signed)
Patient called back stating that the last time she had the RX for her Oxycodone sent to [facility], they were on back order.  Patient is requesting that the Oxycodone be sent to [facility] on [address].  [phone].  Thank you.

## 2018-07-11 ENCOUNTER — Telehealth (INDEPENDENT_AMBULATORY_CARE_PROVIDER_SITE_OTHER): Payer: Self-pay

## 2018-07-11 NOTE — Telephone Encounter (Signed)
Can you call?

## 2018-07-11 NOTE — Telephone Encounter (Signed)
I called pt sister and informed her of Dr Jess Barters decision to not refill Rx at this time, she understood and informed me to change the preferences in system to only contact her number for emergencies only. It was changed and I called other number listed for patient to be scheduled in to be referred to pain management. I lvm for return call.

## 2018-07-11 NOTE — Telephone Encounter (Signed)
Pt was called, lvm.

## 2018-07-19 ENCOUNTER — Other Ambulatory Visit: Payer: Self-pay | Admitting: Internal Medicine

## 2018-07-19 DIAGNOSIS — Z89512 Acquired absence of left leg below knee: Secondary | ICD-10-CM

## 2018-07-19 NOTE — Telephone Encounter (Signed)
Called and spoke to pt, she states that her original script from ortho was not filled due to pharm being out of meds. She is advised to call ortho md back and explain this otherwise she will need an appt here in clinic for eval. She is agreeable

## 2018-07-19 NOTE — Telephone Encounter (Signed)
Needs refill on   oxyCODONE-acetaminophen (PERCOCET/ROXICET) 5-325 MG tablet  [facility] [place], [place] AT [place]  ;pt contact [phone]

## 2018-07-20 ENCOUNTER — Ambulatory Visit (INDEPENDENT_AMBULATORY_CARE_PROVIDER_SITE_OTHER): Payer: Medicare Other | Admitting: Physician Assistant

## 2018-07-20 ENCOUNTER — Encounter (INDEPENDENT_AMBULATORY_CARE_PROVIDER_SITE_OTHER): Payer: Self-pay | Admitting: Physician Assistant

## 2018-07-20 DIAGNOSIS — M170 Bilateral primary osteoarthritis of knee: Secondary | ICD-10-CM

## 2018-07-20 DIAGNOSIS — E1142 Type 2 diabetes mellitus with diabetic polyneuropathy: Secondary | ICD-10-CM

## 2018-07-20 DIAGNOSIS — Z89512 Acquired absence of left leg below knee: Secondary | ICD-10-CM | POA: Diagnosis not present

## 2018-07-20 DIAGNOSIS — I739 Peripheral vascular disease, unspecified: Secondary | ICD-10-CM

## 2018-07-20 MED ORDER — OXYCODONE-ACETAMINOPHEN 5-325 MG PO TABS: 1.0000 | ORAL_TABLET | Freq: Three times a day (TID) | ORAL | 0 refills | Status: DC | PRN

## 2018-07-20 NOTE — Progress Notes (Signed)
Office Visit Note   Patient: [name] [name]           Date of Birth: [birth year]           MRN: [id number] Visit Date: 07/20/2018              Requested by: Ina Homes, MD [address] [place], [place] [address] PCP: Ina Homes, MD  Chief Complaint  Patient presents with  . Left Knee - Pain  . Right Knee - Pain      HPI: The patient is a 43 year old woman who is status post a left transtibial amputation.  She seen for several issues today.  #1 her left transtibial amputation site has several folds in the skin.  This is likely due to further consolidation and contraction as her stump matures and her lymphedema resolves further.  We discussed keeping a close eye on this but there is no skin breakdown currently. #2 bilateral knee osteoarthritis the patient has received intra-articular injections multiple times over the past several months which relieve her pain for only short periods of time.  She is currently doing okay with this but does report continued pain in the knees while ambulating. #3 possible new ischemic changes over the right foot.  She is concerned that her right foot is remaining cool at times and she has noticed a dark area over the ankle and some new calluses over the plantar surface of her foot.  She is continuing to smoke cigarettes but she is attempting to quit. #4 chronic pain issues.  She reports that her pain level is at least a 7 out of 10 most days but she is trying to push through.  She was started on Lyrica by her primary care physician and had been referred to pain management prior to her most recent hospitalization but has not heard anything back since she got out of the hospital and we will make sure that this referral was not dropped during her hospitalization.  Assessment & Plan: Visit Diagnoses:  1. Status post below knee amputation, left ([place])   2. Type 2 diabetes mellitus with diabetic polyneuropathy, unspecified whether long term insulin use  ([place])   3. Primary osteoarthritis of both knees   4. PAD (peripheral artery disease) ([place])     Plan: We discussed that regards to her left transtibial amputation that the area is healing well and without evidence of breakdown but she does need to keep a close eye on the skin fold areas. Will refer to vascular surgery to evaluate right lower extremity flow as she does appear to have new ischemic changes over the dorsum of the right foot and only weak biphasic dopplerable pulses.  Encouraged the patient to continue with smoking cessation. Will bridge the patient with a small prescription for Percocet 5/325 mg #20 no refill continue Lyrica for chronic pain and will make sure that her pain management referral is in process. The calluses over the right foot were debrided with a #10 blade knife today and the patient tolerated this well.  She was instructed to do Achilles stretching to avoid pain over the ball of her foot.  She will follow-up in 2 weeks here for reevaluation of her right foot.  Follow-Up Instructions: Return in about 2 weeks (around 08/03/2018).   Ortho Exam  Patient is alert, oriented, no adenopathy, well-dressed, normal affect, normal respiratory effort. The patient's left transtibial amputation site is well-healed with continued consolidation and improvement in her lymphedema.  She  does have a skin fold/crevice over the posterior thigh as well as some over her distal residual limb but there is no evidence of breakdown over either of these areas.  She has valgus deformity at both knees.  She does have full knee extension.  She ambulates with her prosthetic on her left lower extremity. The right lower extremity has new area of what appears to be skin ischemia over the dorsum of the right ankle.  There are also some new calluses over the fifth meta tarsal head and the fourth metatarsal head and these were debrided to good's healthy appearing skin.  There may be a very small ulcer starting  under the fourth metatarsal head.  Her right ankle range of motion is to neutral and we discussed Achilles stretching as well.  Her pulses in the right lower extremity are biphasic and weak by Doppler.  Her toes are cool.  She does have edema of the right lower extremity which is mildly pitting. Imaging: No results found.    Labs: Lab Results  Component Value Date   HGBA1C 6.5 (A) 06/28/2018   HGBA1C 15.5 (H) 04/05/2018   HGBA1C 11.4 (A) 11/30/2017   ESRSEDRATE 130 (H) 03/24/2017   ESRSEDRATE 53 (H) 09/14/2015   ESRSEDRATE 132 (H) 07/06/2012   CRP 38.6 (H) 04/03/2018   CRP 5.4 (H) 09/14/2015   CRP 23.0 (H) 07/06/2012   REPTSTATUS 04/10/2018 FINAL 04/05/2018   REPTSTATUS 04/10/2018 FINAL 04/05/2018   GRAMSTAIN  08/11/2017    MODERATE WBC PRESENT, PREDOMINANTLY PMN FEW GRAM POSITIVE COCCI    CULT  04/05/2018    NO GROWTH 5 DAYS Performed at [facility], [place] [address], [place], [place] [address]    CULT  04/05/2018    NO GROWTH 5 DAYS Performed at [facility] [address], [place], [place] [address]    LABORGA GROUP B STREP(S.AGALACTIAE)ISOLATED 04/03/2018     Lab Results  Component Value Date   ALBUMIN <1.0 (L) 04/11/2018   ALBUMIN 1.6 (L) 04/03/2018   ALBUMIN 1.9 (L) 08/11/2017    There is no height or weight on file to calculate BMI.  Orders:  Orders Placed This Encounter  Procedures  . Ambulatory referral to Vascular Surgery   Meds ordered this encounter  Medications  . oxyCODONE-acetaminophen (PERCOCET/ROXICET) 5-325 MG tablet    Sig: Take 1 tablet by mouth every 8 (eight) hours as needed for moderate pain or severe pain.    Dispense:  20 tablet    Refill:  0     Procedures: No procedures performed  Clinical Data: No additional findings.  ROS:  All other systems negative, except as noted in the HPI. Review of Systems  Objective: Vital Signs: There were no vitals taken for this visit.  Specialty Comments:  No specialty  comments available.  PMFS History: Patient Active Problem List   Diagnosis Date Noted  . Current moderate episode of major depressive disorder ([place])   . Morbid obesity ([place])   . Anemia of chronic disease   . Debility 04/10/2018  . Bacteremia due to group B Streptococcus 04/04/2018  . Severe protein-calorie malnutrition ([place])   . BKA stump complication ([place]) 93/81/8299  . Herpes genitalis in women 01/31/2018  . Herpes simplex type 1 infection 01/16/2018  . Status post below knee amputation, left ([place]) 04/11/2017  . Bipolar affective disorder ([place])   . Adjustment disorder with depressed mood 03/26/2017  . Abnormal ECG 03/24/2017  . Heart failure with preserved ejection fraction (  [place]) 02/09/2017  . Routine adult health maintenance 12/08/2016  . Chronic pain of right knee 09/08/2016  . Chronic kidney disease (CKD), stage III (moderate) ([place]) 07/15/2016  . OSA (obstructive sleep apnea) 05/13/2016  . Morbid obesity due to excess calories ([place]) 11/27/2015  . History of Low serum cortisol level ([place]) 01/07/2015  . Hypothyroidism 04/25/2013  . Financial difficulty 04/25/2013  . Uncontrolled type 2 diabetes mellitus ([place]) 07/12/2012  . Necrotizing fasciitis s/p OR debridements 07/06/2012  . Carpal tunnel syndrome, bilateral 01/25/2012  . Diabetic peripheral neuropathy ([place]) 07/04/2011  . Anxiety and depression 06/02/2011  . History of abnormal cervical Pap smear 12/13/2007  . GERD 12/06/2007  . Hyperlipidemia 08/29/2007  . Hypertension associated with diabetes ([place]) 08/29/2007   Past Medical History:  Diagnosis Date  . Abdominal muscle pain 09/08/2016  . Abnormal Pap smear of cervix 2009  . Abscess    history of multiple abscesses  . Acute bilateral low back pain 02/13/2017  . Acute blood loss anemia   . Acute renal failure ([place]) 07/12/2012  . AKI (acute kidney injury) ([place])   . Anemia of chronic disease 2002  . Anxiety    Panic attacks  . Bilateral lower extremity edema  05/13/2016  . Bipolar disorder ([place])   . Cellulitis 05/21/2014   right eye  . Chronic bronchitis ([place])    "get it q yr" (05/13/2013)  . Chronic pain   . Depression   . Edema of lower extremity   . Endocarditis 2002   subacute bacterial endocarditis.   . Family history of anesthesia complication    "my mom has a hard time coming out from under"  . Fibromyalgia   . GERD (gastroesophageal reflux disease)    occ  . Heart murmur   . History of blood transfusion    "just low blood count" (05/13/2013)  . Hyperlipidemia   . Hypertension   . Hypothyroidism   . Hypothyroidism, adult 03/21/2014  . Leukocytosis   . Necrosis ([place])    and ulceration  . Obesity   . OSA on CPAP    does not wear CPAP  . Peripheral neuropathy   . Type II diabetes mellitus (HCC)    Type  II    Family History  Problem Relation Age of Onset  . Heart failure Mother   . Diabetes Mother   . Kidney disease Mother   . Kidney disease Father   . Diabetes Father   . Diabetes Paternal Grandmother   . Heart failure Paternal Grandmother   . Other Other     Past Surgical History:  Procedure Laterality Date  . AMPUTATION Left 04/07/2017   Procedure: LEFT BELOW KNEE AMPUTATION;  Surgeon: Newt Minion, MD;  Location: [facility];  Service: Orthopedics;  Laterality: Left;  . EYE SURGERY     lazer  . INCISION AND DRAINAGE ABSCESS     multiple I&Ds  . INCISION AND DRAINAGE ABSCESS Left 07/09/2012   Procedure: DRESSING CHANGE, THIGH WOUND;  Surgeon: Harl Bowie, MD;  Location: [facility];  Service: General;  Laterality: Left;  . INCISION AND DRAINAGE OF WOUND Left 07/07/2012   Procedure: IRRIGATION AND DEBRIDEMENT WOUND;  Surgeon: Harl Bowie, MD;  Location: [facility];  Service: General;  Laterality: Left;  . INCISION AND DRAINAGE PERIRECTAL ABSCESS Left 07/14/2012   Procedure: DEBRIDEMENT OF SKIN & SOFT TISSUE; DRESSING CHANGE UNDER ANESTHESIA;  Surgeon: Gayland Curry, MD,FACS;  Location: [facility];  Service: General;   Laterality: Left;  .  INCISION AND DRAINAGE PERIRECTAL ABSCESS Left 07/16/2012   Procedure: I&D Left Thigh;  Surgeon: Gwenyth Ober, MD;  Location: [facility];  Service: General;  Laterality: Left;  . INCISION AND DRAINAGE PERIRECTAL ABSCESS N/A 01/05/2015   Procedure: IRRIGATION AND DEBRIDEMENT PERIRECTAL ABSCESS;  Surgeon: Donnie Mesa, MD;  Location: [facility];  Service: General;  Laterality: N/A;  . IRRIGATION AND DEBRIDEMENT ABSCESS Left 07/06/2012   Procedure: IRRIGATION AND DEBRIDEMENT ABSCESS BUTTOCKS AND THIGH;  Surgeon: Shann Medal, MD;  Location: [facility];  Service: General;  Laterality: Left;  . IRRIGATION AND DEBRIDEMENT ABSCESS Left 08/10/2012   Procedure: IRRIGATION AND DEBRIDEMENT ABSCESS;  Surgeon: Madilyn Hook, DO;  Location: [facility];  Service: General;  Laterality: Left;  . STUMP REVISION Left 06/09/2017   Procedure: REVISION LEFT BELOW KNEE AMPUTATION;  Surgeon: Newt Minion, MD;  Location: [facility];  Service: Orthopedics;  Laterality: Left;  . TONSILLECTOMY  1994   Social History   Occupational History  . Occupation: disability  Tobacco Use  . Smoking status: Current Every Day Smoker    Packs/day: 0.50    Years: 16.00    Pack years: 8.00    Types: Cigarettes  . Smokeless tobacco: Never Used  Substance and Sexual Activity  . Alcohol use: Yes    Alcohol/week: 0.0 standard drinks    Comment: Socially-" monthy  maybe"  . Drug use: No  . Sexual activity: Yes

## 2018-08-01 ENCOUNTER — Other Ambulatory Visit: Payer: Self-pay | Admitting: Internal Medicine

## 2018-08-03 ENCOUNTER — Ambulatory Visit (INDEPENDENT_AMBULATORY_CARE_PROVIDER_SITE_OTHER): Payer: Medicare Other | Admitting: Physician Assistant

## 2018-08-15 ENCOUNTER — Encounter: Payer: Self-pay | Admitting: Licensed Clinical Social Worker

## 2018-08-16 ENCOUNTER — Telehealth (INDEPENDENT_AMBULATORY_CARE_PROVIDER_SITE_OTHER): Payer: Self-pay | Admitting: *Deleted

## 2018-08-16 NOTE — Telephone Encounter (Signed)
Called pt and lvm to call back to answer pre-screening questions.

## 2018-08-17 ENCOUNTER — Encounter (INDEPENDENT_AMBULATORY_CARE_PROVIDER_SITE_OTHER): Payer: Self-pay | Admitting: Physician Assistant

## 2018-08-17 ENCOUNTER — Other Ambulatory Visit: Payer: Self-pay

## 2018-08-17 ENCOUNTER — Other Ambulatory Visit (INDEPENDENT_AMBULATORY_CARE_PROVIDER_SITE_OTHER): Payer: Self-pay

## 2018-08-17 ENCOUNTER — Telehealth: Payer: Self-pay | Admitting: Internal Medicine

## 2018-08-17 ENCOUNTER — Ambulatory Visit (INDEPENDENT_AMBULATORY_CARE_PROVIDER_SITE_OTHER): Payer: Medicare Other | Admitting: Physician Assistant

## 2018-08-17 DIAGNOSIS — Z89512 Acquired absence of left leg below knee: Secondary | ICD-10-CM

## 2018-08-17 DIAGNOSIS — N182 Chronic kidney disease, stage 2 (mild): Secondary | ICD-10-CM | POA: Diagnosis not present

## 2018-08-17 DIAGNOSIS — M170 Bilateral primary osteoarthritis of knee: Secondary | ICD-10-CM

## 2018-08-17 DIAGNOSIS — E1142 Type 2 diabetes mellitus with diabetic polyneuropathy: Secondary | ICD-10-CM

## 2018-08-17 NOTE — Progress Notes (Signed)
Office Visit Note   Patient: [name] [name]           Date of Birth: [birth year]           MRN: [id number] Visit Date: 08/17/2018              Requested by: Ina Homes, MD [address] [place], [place] [address] PCP: Ina Homes, MD  No chief complaint on file.     HPI: The patient is a 43 yo woman who is seen for follow up of her left transtibial amputation and ulcers over the right foot.  She was referred to pain management, but declined evaluation by pain management here at [facility]. She requests another referral at this time.  Her right foot is improved and without drainage or new areas of ulceration.    Assessment & Plan: Visit Diagnoses:  1. Status post below knee amputation, left ([place])   2. Type 2 diabetes mellitus with diabetic polyneuropathy, unspecified whether long term insulin use ([place])   3. Primary osteoarthritis of both knees   4. CKD (chronic kidney disease) stage 2, GFR 60-89 ml/min     Plan: Referral for another pain management program was completed today.  Patient given a Vive compression sock to try on the right foot today and instructed to wear around the clock except for showering. Follow up in 4 weeks or sooner if difficulties in the interim.   Follow-Up Instructions: Return in about 4 weeks (around 09/14/2018).   Ortho Exam  Patient is alert, oriented, no adenopathy, well-dressed, normal affect, normal respiratory effort. Left transtibial amputation site is well healed and remains less edematous. She is wearing and walking with her prosthesis with a antalgic appearing gait. Bilateral knees in valgus .  Right anterior ankle darkened area less and palpable pedal pulse. Foot is warm.  Right 5th MT head with scant callus, no ulceration. Pitting edema of the right lower leg, silver compression sock applied.   Imaging: No results found. No images are attached to the encounter.  Labs: Lab Results  Component Value Date   HGBA1C 6.5 (A)  06/28/2018   HGBA1C 15.5 (H) 04/05/2018   HGBA1C 11.4 (A) 11/30/2017   ESRSEDRATE 130 (H) 03/24/2017   ESRSEDRATE 53 (H) 09/14/2015   ESRSEDRATE 132 (H) 07/06/2012   CRP 38.6 (H) 04/03/2018   CRP 5.4 (H) 09/14/2015   CRP 23.0 (H) 07/06/2012   REPTSTATUS 04/10/2018 FINAL 04/05/2018   REPTSTATUS 04/10/2018 FINAL 04/05/2018   GRAMSTAIN  08/11/2017    MODERATE WBC PRESENT, PREDOMINANTLY PMN FEW GRAM POSITIVE COCCI    CULT  04/05/2018    NO GROWTH 5 DAYS Performed at [facility], [place] [address], [place], [place] [address]    CULT  04/05/2018    NO GROWTH 5 DAYS Performed at [facility] [address], [place], [place] [address]    LABORGA GROUP B STREP(S.AGALACTIAE)ISOLATED 04/03/2018     Lab Results  Component Value Date   ALBUMIN <1.0 (L) 04/11/2018   ALBUMIN 1.6 (L) 04/03/2018   ALBUMIN 1.9 (L) 08/11/2017    There is no height or weight on file to calculate BMI.  Orders:  No orders of the defined types were placed in this encounter.  No orders of the defined types were placed in this encounter.    Procedures: No procedures performed  Clinical Data: No additional findings.  ROS:  All other systems negative, except as noted in the HPI. Review of Systems  Objective:  Vital Signs: There were no vitals taken for this visit.  Specialty Comments:  No specialty comments available.  PMFS History: Patient Active Problem List   Diagnosis Date Noted  . Current moderate episode of major depressive disorder ([place])   . Morbid obesity ([place])   . Anemia of chronic disease   . Debility 04/10/2018  . Bacteremia due to group B Streptococcus 04/04/2018  . Severe protein-calorie malnutrition ([place] [employer] [place])   . BKA stump complication ([place]) 27/10/2374  . Herpes genitalis in women 01/31/2018  . Herpes simplex type 1 infection 01/16/2018  . Status post below knee amputation, left ([place]) 04/11/2017  . Bipolar affective disorder ([place])   . Adjustment disorder with  depressed mood 03/26/2017  . Abnormal ECG 03/24/2017  . Heart failure with preserved ejection fraction ([place]) 02/09/2017  . Routine adult health maintenance 12/08/2016  . Chronic pain of right knee 09/08/2016  . Chronic kidney disease (CKD), stage III (moderate) ([place]) 07/15/2016  . OSA (obstructive sleep apnea) 05/13/2016  . Morbid obesity due to excess calories ([place]) 11/27/2015  . History of Low serum cortisol level ([place]) 01/07/2015  . Hypothyroidism 04/25/2013  . Financial difficulty 04/25/2013  . Uncontrolled type 2 diabetes mellitus ([place]) 07/12/2012  . Necrotizing fasciitis s/p OR debridements 07/06/2012  . Carpal tunnel syndrome, bilateral 01/25/2012  . Diabetic peripheral neuropathy ([place]) 07/04/2011  . Anxiety and depression 06/02/2011  . History of abnormal cervical Pap smear 12/13/2007  . GERD 12/06/2007  . Hyperlipidemia 08/29/2007  . Hypertension associated with diabetes ([place]) 08/29/2007   Past Medical History:  Diagnosis Date  . Abdominal muscle pain 09/08/2016  . Abnormal Pap smear of cervix 2009  . Abscess    history of multiple abscesses  . Acute bilateral low back pain 02/13/2017  . Acute blood loss anemia   . Acute renal failure ([place]) 07/12/2012  . AKI (acute kidney injury) ([place])   . Anemia of chronic disease 2002  . Anxiety    Panic attacks  . Bilateral lower extremity edema 05/13/2016  . Bipolar disorder ([place])   . Cellulitis 05/21/2014   right eye  . Chronic bronchitis ([place])    "get it q yr" (05/13/2013)  . Chronic pain   . Depression   . Edema of lower extremity   . Endocarditis 2002   subacute bacterial endocarditis.   . Family history of anesthesia complication    "my mom has a hard time coming out from under"  . Fibromyalgia   . GERD (gastroesophageal reflux disease)    occ  . Heart murmur   . History of blood transfusion    "just low blood count" (05/13/2013)  . Hyperlipidemia   . Hypertension   . Hypothyroidism   . Hypothyroidism, adult  03/21/2014  . Leukocytosis   . Necrosis ([place])    and ulceration  . Obesity   . OSA on CPAP    does not wear CPAP  . Peripheral neuropathy   . Type II diabetes mellitus (HCC)    Type  II    Family History  Problem Relation Age of Onset  . Heart failure Mother   . Diabetes Mother   . Kidney disease Mother   . Kidney disease Father   . Diabetes Father   . Diabetes Paternal Grandmother   . Heart failure Paternal Grandmother   . Other Other     Past Surgical History:  Procedure Laterality Date  . AMPUTATION Left 04/07/2017   Procedure: LEFT BELOW KNEE AMPUTATION;  Surgeon: Newt Minion,  MD;  Location: [facility];  Service: Orthopedics;  Laterality: Left;  . EYE SURGERY     lazer  . INCISION AND DRAINAGE ABSCESS     multiple I&Ds  . INCISION AND DRAINAGE ABSCESS Left 07/09/2012   Procedure: DRESSING CHANGE, THIGH WOUND;  Surgeon: Harl Bowie, MD;  Location: [facility];  Service: General;  Laterality: Left;  . INCISION AND DRAINAGE OF WOUND Left 07/07/2012   Procedure: IRRIGATION AND DEBRIDEMENT WOUND;  Surgeon: Harl Bowie, MD;  Location: [facility];  Service: General;  Laterality: Left;  . INCISION AND DRAINAGE PERIRECTAL ABSCESS Left 07/14/2012   Procedure: DEBRIDEMENT OF SKIN & SOFT TISSUE; DRESSING CHANGE UNDER ANESTHESIA;  Surgeon: Gayland Curry, MD,FACS;  Location: [facility];  Service: General;  Laterality: Left;  . INCISION AND DRAINAGE PERIRECTAL ABSCESS Left 07/16/2012   Procedure: I&D Left Thigh;  Surgeon: Gwenyth Ober, MD;  Location: [facility];  Service: General;  Laterality: Left;  . INCISION AND DRAINAGE PERIRECTAL ABSCESS N/A 01/05/2015   Procedure: IRRIGATION AND DEBRIDEMENT PERIRECTAL ABSCESS;  Surgeon: Donnie Mesa, MD;  Location: [facility];  Service: General;  Laterality: N/A;  . IRRIGATION AND DEBRIDEMENT ABSCESS Left 07/06/2012   Procedure: IRRIGATION AND DEBRIDEMENT ABSCESS BUTTOCKS AND THIGH;  Surgeon: Shann Medal, MD;  Location: [facility];  Service: General;  Laterality:  Left;  . IRRIGATION AND DEBRIDEMENT ABSCESS Left 08/10/2012   Procedure: IRRIGATION AND DEBRIDEMENT ABSCESS;  Surgeon: Madilyn Hook, DO;  Location: [facility];  Service: General;  Laterality: Left;  . STUMP REVISION Left 06/09/2017   Procedure: REVISION LEFT BELOW KNEE AMPUTATION;  Surgeon: Newt Minion, MD;  Location: [facility];  Service: Orthopedics;  Laterality: Left;  . TONSILLECTOMY  1994   Social History   Occupational History  . Occupation: disability  Tobacco Use  . Smoking status: Current Every Day Smoker    Packs/day: 0.50    Years: 16.00    Pack years: 8.00    Types: Cigarettes  . Smokeless tobacco: Never Used  Substance and Sexual Activity  . Alcohol use: Yes    Alcohol/week: 0.0 standard drinks    Comment: Socially-" monthy  maybe"  . Drug use: No  . Sexual activity: Yes

## 2018-08-17 NOTE — Telephone Encounter (Signed)
Pt would like a call back.  Pt seen by Dr Sharol Given today as was told she would have to contact her PCP as they are unable to treat her chronic pain and her Referral is going to take a while COVID-19.  The patient can be reached @ [phone].

## 2018-08-22 ENCOUNTER — Other Ambulatory Visit: Payer: Self-pay | Admitting: Internal Medicine

## 2018-08-22 DIAGNOSIS — Z89512 Acquired absence of left leg below knee: Secondary | ICD-10-CM

## 2018-08-22 DIAGNOSIS — M170 Bilateral primary osteoarthritis of knee: Secondary | ICD-10-CM

## 2018-08-23 MED ORDER — OXYCODONE-ACETAMINOPHEN 5-325 MG PO TABS: 1.0000 | ORAL_TABLET | Freq: Every day | ORAL | 0 refills | Status: DC | PRN

## 2018-08-23 NOTE — Telephone Encounter (Signed)
Pt stated she saw Dr Sharol Given who ordered Pain Ctr Referral but it may take awhile before she gets an appt. She would like a refill on her pain medications. Thanks

## 2018-08-23 NOTE — Telephone Encounter (Signed)
Patient called for pain medication refill. She had been getting refills through the orthopedic office however, after approximately 6 months they told her they could no longer fill the prescription. They placed a referral for pain management. We discuss a bridge to get her to pain management. Patient agrees.

## 2018-08-28 ENCOUNTER — Ambulatory Visit (INDEPENDENT_AMBULATORY_CARE_PROVIDER_SITE_OTHER): Payer: Medicare Other | Admitting: Licensed Clinical Social Worker

## 2018-08-28 ENCOUNTER — Encounter: Payer: Self-pay | Admitting: Licensed Clinical Social Worker

## 2018-08-28 DIAGNOSIS — F331 Major depressive disorder, recurrent, moderate: Secondary | ICD-10-CM

## 2018-08-28 NOTE — BH Specialist Note (Signed)
[facility] Visit via Telemedicine (Telephone)  08/28/2018 [name] [name] [id number]   Session Start time: 9:05  Session End time: 9:30 Total time: 25 minutes  Referring Provider: Dr. Tarri Abernethy Type of Visit: Telephonic Patient location: Home [facility] Provider location: Remote/Home All persons participating in visit: patient, [facility], [facility] intern  Confirmed patient's address: Yes  Confirmed patient's phone number: Yes  [phone] Any changes to demographics: phone number was different than in the chart  Discussed confidentiality: Yes    The following statements were read to the patient and/or legal guardian that are established with the [facility] Provider.  "The purpose of this phone visit is to provide behavioral health care while limiting exposure to the coronavirus (COVID19).  There is a possibility of technology failure and discussed alternative modes of communication if that failure occurs."  "By engaging in this telephone visit, you consent to the provision of healthcare.  Additionally, you authorize for your insurance to be billed for the services provided during this telephone visit."   Patient and/or legal guardian consented to telephone visit: Yes   PRESENTING CONCERNS: Patient and/or family reports the following symptoms/concerns: depression, lack of interest in activities, anxiety, isolates, and health issues.  Duration of problem: over 20 years; Severity of problem: moderate  STRENGTHS (Protective Factors/Coping Skills): Family support  GOALS ADDRESSED: Patient will: 1.  Reduce symptoms of: anxiety, depression and stress  2.  Increase knowledge and/or ability of: coping skills, healthy habits and stress reduction  3.  Demonstrate ability to: Increase healthy adjustment to current life circumstances, Increase adequate support systems for patient/family and acceptance of health challenges.  INTERVENTIONS: Interventions utilized:  Supportive  Counseling Standardized Assessments completed: assessed for SI, HI, and self-harm.  ASSESSMENT: Patient currently experiencing depression symptoms. Patient reported that she feels she is "going from day to day, not living". Patient reported depression started when she was a teenager, and she was hospitalized when she was 85.  Patient reported her leg was amputated in 2018 very unexpectedly. Patient has not accepted that her leg was amputated, and has conflicting feelings towards her leg. Patient is fearful that she will continue to have more complications from having diabetes.   Patient may benefit from weekly to bi-weekly counseling.  PLAN: 1. Follow up with behavioral health clinician on : one week via telephone. 2. Referral(s): [place] (In Clinic)  [place], [facility], [place]

## 2018-08-29 ENCOUNTER — Encounter: Payer: Self-pay | Admitting: *Deleted

## 2018-08-29 ENCOUNTER — Other Ambulatory Visit: Payer: Self-pay | Admitting: *Deleted

## 2018-08-29 NOTE — Patient Outreach (Signed)
[facility]) Care Management  08/29/2018  VALBONA SLABACH November 06, 1975 [id number]   CSW made an initial attempt to try and contact patient today to perform the initial phone assessment, as well as assess and assist with social work needs and services, without success.  A HIPAA compliant message was left for patient on voicemail.  CSW is currently awaiting a return call.  CSW will make a second outreach attempt within the next 3-4 business days, if a return call is not received from patient in the meantime.  CSW will also mail an Outreach Letter to patient's home requesting that patient contact CSW if patient is interested in receiving social work services through [place] with Scientist, clinical (histocompatibility and immunogenetics).  Nat Christen, BSW, MSW, LCSW  Licensed Education officer, environmental Health System  Mailing [place] [address] [place], [place] [address] Physical Address-[address], [place], [place] [address] Toll Free Main # [phone] Fax # [phone] Cell # [phone]  Office # [phone] [email or web address]

## 2018-09-04 ENCOUNTER — Other Ambulatory Visit: Payer: Self-pay | Admitting: *Deleted

## 2018-09-04 ENCOUNTER — Other Ambulatory Visit: Payer: Self-pay | Admitting: Internal Medicine

## 2018-09-04 ENCOUNTER — Ambulatory Visit (INDEPENDENT_AMBULATORY_CARE_PROVIDER_SITE_OTHER): Payer: Medicare Other | Admitting: Licensed Clinical Social Worker

## 2018-09-04 ENCOUNTER — Encounter: Payer: Self-pay | Admitting: Licensed Clinical Social Worker

## 2018-09-04 DIAGNOSIS — B3731 Acute candidiasis of vulva and vagina: Secondary | ICD-10-CM

## 2018-09-04 DIAGNOSIS — B373 Candidiasis of vulva and vagina: Secondary | ICD-10-CM

## 2018-09-04 DIAGNOSIS — F331 Major depressive disorder, recurrent, moderate: Secondary | ICD-10-CM

## 2018-09-04 NOTE — BH Specialist Note (Signed)
[facility] Visit via Telemedicine (Telephone)  09/04/2018 [name] [name] [id number]   Session Start time: 1:05  Session End time: 1:40 Total time: 35 minutes  Referring Provider: Dr. Tarri Abernethy Type of Visit: Telephonic Patient location: Home [facility] Provider location: Remote/Home All persons participating in visit: patient, [facility], [facility] Intern  Confirmed patient's address: Yes  Confirmed patient's phone number: Yes  Any changes to demographics: No   Discussed confidentiality: Yes    The following statements were read to the patient and/or legal guardian that are established with the [facility] Provider.  "The purpose of this phone visit is to provide behavioral health care while limiting exposure to the coronavirus (COVID19).  There is a possibility of technology failure and discussed alternative modes of communication if that failure occurs."  "By engaging in this telephone visit, you consent to the provision of healthcare.  Additionally, you authorize for your insurance to be billed for the services provided during this telephone visit."   Patient and/or legal guardian consented to telephone visit: Yes   PRESENTING CONCERNS: Patient and/or family reports the following symptoms/concerns: depression, lack of interest in activities (stopped sewing and drawing), isolation, and health issues.   Duration of problem: over 20 years; Severity of problem: moderate  STRENGTHS (Protective Factors/Coping Skills): Watches Psychologist, counselling  GOALS ADDRESSED: Patient will: 1.  Reduce symptoms of: anxiety, depression and stress  2.  Increase knowledge and/or ability of: coping skills, healthy habits and stress reduction  3.  Demonstrate ability to: Increase healthy adjustment to current life circumstances, Increase adequate support systems for patient/family and acceptance of health challenges.  INTERVENTIONS: Interventions utilized:  Motivational Interviewing, Mindfulness  or Psychologist, educational, Behavioral Activation, Brief CBT and Supportive Counseling Standardized Assessments completed: assessed for SI, HI, and self-harm.  ASSESSMENT: Patient currently experiencing lack of interest in activities (reported she does not engage in activities that require the use of her hands) due to the depression and health challenges. Patient reported that she isolates, and this has been a pattern since a child. Patient reported she is comfortable with existing relationships, and has trust issues with new people. Patient reported that she wants people to be involved in her life.   Patient reported she worries about being seen by people. Patient reported this was an issue prior to her amputation, but has increased in severity since. Patient will still attend events, but worries prior to the event. Patient reported she will have a good time while she is around others, but it takes her a while to calm her nerves.   Patient reported that she has just started having a relationship with her father. Patient is very cautious in this relationship.   Patient may benefit from weekly outpatient therapy.  PLAN: 1. Follow up with behavioral health clinician on : one week via telephone.   Dessie Coma, [facility], [place]

## 2018-09-04 NOTE — Patient Outreach (Signed)
[facility]) Care Management  09/04/2018  TRESEA HEINE 05-05-1976 [id number]   CSW made a second attempt to try and contact patient today to perform phone assessment, as well as assess and assist with social work needs and services, without success.  A HIPAA compliant message was left for patient on voicemail.  CSW continues to await a return call.  CSW will make a third and final outreach attempt within the next 3-4 business days, if a return call is not received from patient in the meantime.  CSW will then proceed with case closure if a return call is not received from patient with a total of 10 business days, as required number of phone attempts will have been made and outreach letter mailed.   Nat Christen, BSW, MSW, LCSW  Licensed Education officer, environmental Health System  Mailing [place] [address], [place], [place] [address] Physical Address-[address], [place],  [address] Toll Free Main # [phone] Fax # [phone] Cell # [phone]  Office # [phone] [email or web address]

## 2018-09-11 ENCOUNTER — Other Ambulatory Visit: Payer: Self-pay | Admitting: *Deleted

## 2018-09-11 ENCOUNTER — Telehealth: Payer: Self-pay | Admitting: Licensed Clinical Social Worker

## 2018-09-11 ENCOUNTER — Ambulatory Visit: Payer: Self-pay | Admitting: Licensed Clinical Social Worker

## 2018-09-11 NOTE — Telephone Encounter (Signed)
Patient was contacted twice for her scheduled appointment. Patient did not answer, and a voicemail was left. Patient was instructed to contact our office if she would like to reschedule this appointment.

## 2018-09-11 NOTE — Patient Outreach (Signed)
[facility]) Care Management  09/11/2018  [name] [name] Aug 07, 1975 [id number]    CSW made a third and final attempt to try and contact patient today to perform phone assessment, as well as assess and assist with social work needs and services, without success.  A HIPAA compliant message was left for patient on voicemail.  CSW is currently awaiting a return call.  CSW will proceed with case closure in two business days, if a return call is not received in the meantime, as required number of phone attempts have been made and an outreach letter was mailed to patient's home allowing 10 business days for a response.  Nat Christen, BSW, MSW, LCSW  Licensed Education officer, environmental Health System  Mailing [place] [address], [place], [place] [address] Physical Address-[address], [place], [place] [address] Toll Free Main # [phone] Fax # [phone] Cell # [phone]  Office # [phone] [email or web address]

## 2018-09-13 ENCOUNTER — Encounter: Payer: Self-pay | Admitting: *Deleted

## 2018-09-13 ENCOUNTER — Other Ambulatory Visit: Payer: Self-pay | Admitting: *Deleted

## 2018-09-13 NOTE — Patient Outreach (Signed)
[facility]) Care Management  09/13/2018  LURA FALOR September 04, 1975 [id number]    CSW will perform a case closure on patient, due to inability to make initial phone contact with patient, despite required number of phone attempts made and outreach letter mailed to patient's home, allowing 10 business days for a response.  CSW will fax an update to patient's Primary Care Physician, Dr. Ina Homes to ensure that they are aware of CSW's involvement with patient's plan of care.  Nat Christen, BSW, MSW, LCSW  Licensed Education officer, environmental Health System  Mailing [place] [address] [place], [place] [address] Physical Address-[address], [place], [place] [address] Toll Free Main # [phone] Fax # [phone] Cell # [phone]  Office # [phone] [email or web address]

## 2018-09-14 ENCOUNTER — Ambulatory Visit (INDEPENDENT_AMBULATORY_CARE_PROVIDER_SITE_OTHER): Payer: Medicare Other | Admitting: Physician Assistant

## 2018-09-14 ENCOUNTER — Other Ambulatory Visit: Payer: Self-pay

## 2018-09-14 ENCOUNTER — Encounter (INDEPENDENT_AMBULATORY_CARE_PROVIDER_SITE_OTHER): Payer: Self-pay | Admitting: Physician Assistant

## 2018-09-14 VITALS — Ht 70.0 in | Wt 294.1 lb

## 2018-09-14 DIAGNOSIS — M170 Bilateral primary osteoarthritis of knee: Secondary | ICD-10-CM

## 2018-09-14 DIAGNOSIS — Z89512 Acquired absence of left leg below knee: Secondary | ICD-10-CM | POA: Diagnosis not present

## 2018-09-14 DIAGNOSIS — I890 Lymphedema, not elsewhere classified: Secondary | ICD-10-CM

## 2018-09-14 DIAGNOSIS — I739 Peripheral vascular disease, unspecified: Secondary | ICD-10-CM | POA: Diagnosis not present

## 2018-09-14 DIAGNOSIS — E1142 Type 2 diabetes mellitus with diabetic polyneuropathy: Secondary | ICD-10-CM

## 2018-09-14 DIAGNOSIS — N182 Chronic kidney disease, stage 2 (mild): Secondary | ICD-10-CM

## 2018-09-14 MED ORDER — LIDOCAINE HCL 1 % IJ SOLN
5.0000 mL | INTRAMUSCULAR | Status: AC | PRN
  Administered 2018-09-14: 10:00:00 5 mL

## 2018-09-14 MED ORDER — METHYLPREDNISOLONE ACETATE 40 MG/ML IJ SUSP
40.0000 mg | INTRAMUSCULAR | Status: AC | PRN
  Administered 2018-09-14: 10:00:00 40 mg via INTRA_ARTICULAR

## 2018-09-14 NOTE — Progress Notes (Signed)
Office Visit Note   Patient: [name] [name]           Date of Birth: January 21, 1976           MRN: [id number] Visit Date: 09/14/2018              Requested by: Ina Homes, MD [address] [place], [place] [address] PCP: Ina Homes, MD  Chief Complaint  Patient presents with  . Left Leg - Follow-up    Revision Left BKA 06/09/17  . Right Foot - Follow-up      HPI: The patient is a 43 yo woman who is seen for follow up of her left knee arthritis. She has a history of a left transtibial amputation and continues to do much better with regards to the amputation site. Her edema continues to improve and she is walking with a prosthesis. She is wearing a medical compression sock on the right leg. She is reporting worsening of the left knee pain with known osteoarthritis and comes in today requesting a steroid injection.    Assessment & Plan: Visit Diagnoses:  1. Status post below knee amputation, left ([place])   2. Type 2 diabetes mellitus with diabetic polyneuropathy, unspecified whether long term insulin use ([place])   3. Primary osteoarthritis of both knees   4. CKD (chronic kidney disease) stage 2, GFR 60-89 ml/min   5. PAD (peripheral artery disease) ([place])   6. Lymphedema of left lower extremity     Plan: The left knee was injected with lidocaine and depo medrol under sterile techniques and the patient tolerated the procedure well.  She will follow up prn for questions or concerns.   Follow-Up Instructions: Return if symptoms worsen or fail to improve.   Ortho Exam  Patient is alert, oriented, no adenopathy, well-dressed, normal affect, normal respiratory effort. The left knee has full extension and at least 110 degrees of flexion. She has crepitus with range of motion and pain over the medial joint line. Valgus angulation bilaterally.   Imaging: No results found. No images are attached to the encounter.  Labs: Lab Results  Component Value Date   HGBA1C 6.5 (A)  06/28/2018   HGBA1C 15.5 (H) 04/05/2018   HGBA1C 11.4 (A) 11/30/2017   ESRSEDRATE 130 (H) 03/24/2017   ESRSEDRATE 53 (H) 09/14/2015   ESRSEDRATE 132 (H) 07/06/2012   CRP 38.6 (H) 04/03/2018   CRP 5.4 (H) 09/14/2015   CRP 23.0 (H) 07/06/2012   REPTSTATUS 04/10/2018 FINAL 04/05/2018   REPTSTATUS 04/10/2018 FINAL 04/05/2018   GRAMSTAIN  08/11/2017    MODERATE WBC PRESENT, PREDOMINANTLY PMN FEW GRAM POSITIVE COCCI    CULT  04/05/2018    NO GROWTH 5 DAYS Performed at [facility], [place] [address], [place], [place] [address]    CULT  04/05/2018    NO GROWTH 5 DAYS Performed at [facility] [address] [place], [place] [address]    LABORGA GROUP B STREP(S.AGALACTIAE)ISOLATED 04/03/2018     Lab Results  Component Value Date   ALBUMIN <1.0 (L) 04/11/2018   ALBUMIN 1.6 (L) 04/03/2018   ALBUMIN 1.9 (L) 08/11/2017    Body mass index is 42.2 kg/m.  Orders:  Orders Placed This Encounter  Procedures  . Large Joint Inj   No orders of the defined types were placed in this encounter.    Procedures: Large Joint Inj: L knee on 09/14/2018 9:51 AM Indications: pain and diagnostic evaluation Details: 22 G 1.5 in needle, anteromedial  approach  Arthrogram: No  Medications: 5 mL lidocaine 1 %; 40 mg methylPREDNISolone acetate 40 MG/ML Outcome: tolerated well, no immediate complications Procedure, treatment alternatives, risks and benefits explained, specific risks discussed. Consent was given by the patient. Immediately prior to procedure a time out was called to verify the correct patient, procedure, equipment, support staff and site/side marked as required. Patient was prepped and draped in the usual sterile fashion.      Clinical Data: No additional findings.  ROS:  All other systems negative, except as noted in the HPI. Review of Systems  Objective: Vital Signs: Ht 5\' 10"  (1.778 m)   Wt 294 lb 1.6 oz (133.4 kg)   BMI 42.20 kg/m   Specialty Comments:   No specialty comments available.  PMFS History: Patient Active Problem List   Diagnosis Date Noted  . Current moderate episode of major depressive disorder ([place])   . Morbid obesity ([place])   . Anemia of chronic disease   . Debility 04/10/2018  . Bacteremia due to group B Streptococcus 04/04/2018  . Severe protein-calorie malnutrition ([place])   . BKA stump complication ([place]) 46/65/9935  . Herpes genitalis in women 01/31/2018  . Herpes simplex type 1 infection 01/16/2018  . Status post below knee amputation, left ([place]) 04/11/2017  . Bipolar affective disorder ([place])   . Adjustment disorder with depressed mood 03/26/2017  . Abnormal ECG 03/24/2017  . Heart failure with preserved ejection fraction ([place]) 02/09/2017  . Routine adult health maintenance 12/08/2016  . Chronic pain of right knee 09/08/2016  . Chronic kidney disease (CKD), stage III (moderate) ([place]) 07/15/2016  . OSA (obstructive sleep apnea) 05/13/2016  . Morbid obesity due to excess calories ([place]) 11/27/2015  . History of Low serum cortisol level ([place]) 01/07/2015  . Hypothyroidism 04/25/2013  . Financial difficulty 04/25/2013  . Uncontrolled type 2 diabetes mellitus ([place]) 07/12/2012  . Necrotizing fasciitis s/p OR debridements 07/06/2012  . Carpal tunnel syndrome, bilateral 01/25/2012  . Diabetic peripheral neuropathy ([place]) 07/04/2011  . Anxiety and depression 06/02/2011  . History of abnormal cervical Pap smear 12/13/2007  . GERD 12/06/2007  . Hyperlipidemia 08/29/2007  . Hypertension associated with diabetes ([place]) 08/29/2007   Past Medical History:  Diagnosis Date  . Abdominal muscle pain 09/08/2016  . Abnormal Pap smear of cervix 2009  . Abscess    history of multiple abscesses  . Acute bilateral low back pain 02/13/2017  . Acute blood loss anemia   . Acute renal failure ([place]) 07/12/2012  . AKI (acute kidney injury) ([place])   . Anemia of chronic disease 2002  . Anxiety    Panic attacks  . Bilateral lower extremity  edema 05/13/2016  . Bipolar disorder ([place])   . Cellulitis 05/21/2014   right eye  . Chronic bronchitis ([place])    "get it q yr" (05/13/2013)  . Chronic pain   . Depression   . Edema of lower extremity   . Endocarditis 2002   subacute bacterial endocarditis.   . Family history of anesthesia complication    "my mom has a hard time coming out from under"  . Fibromyalgia   . GERD (gastroesophageal reflux disease)    occ  . Heart murmur   . History of blood transfusion    "just low blood count" (05/13/2013)  . Hyperlipidemia   . Hypertension   . Hypothyroidism   . Hypothyroidism, adult 03/21/2014  . Leukocytosis   . Necrosis ([place])    and ulceration  . Obesity   .  OSA on CPAP    does not wear CPAP  . Peripheral neuropathy   . Type II diabetes mellitus (HCC)    Type  II    Family History  Problem Relation Age of Onset  . Heart failure Mother   . Diabetes Mother   . Kidney disease Mother   . Kidney disease Father   . Diabetes Father   . Diabetes Paternal Grandmother   . Heart failure Paternal Grandmother   . Other Other     Past Surgical History:  Procedure Laterality Date  . AMPUTATION Left 04/07/2017   Procedure: LEFT BELOW KNEE AMPUTATION;  Surgeon: Newt Minion, MD;  Location: [facility];  Service: Orthopedics;  Laterality: Left;  . EYE SURGERY     [place]  . INCISION AND DRAINAGE ABSCESS     multiple I&Ds  . INCISION AND DRAINAGE ABSCESS Left 07/09/2012   Procedure: DRESSING CHANGE, THIGH WOUND;  Surgeon: Harl Bowie, MD;  Location: [facility];  Service: General;  Laterality: Left;  . INCISION AND DRAINAGE OF WOUND Left 07/07/2012   Procedure: IRRIGATION AND DEBRIDEMENT WOUND;  Surgeon: Harl Bowie, MD;  Location: [facility];  Service: General;  Laterality: Left;  . INCISION AND DRAINAGE PERIRECTAL ABSCESS Left 07/14/2012   Procedure: DEBRIDEMENT OF SKIN & SOFT TISSUE; DRESSING CHANGE UNDER ANESTHESIA;  Surgeon: Gayland Curry, MD,FACS;  Location: [facility];  Service:  General;  Laterality: Left;  . INCISION AND DRAINAGE PERIRECTAL ABSCESS Left 07/16/2012   Procedure: I&D Left Thigh;  Surgeon: Gwenyth Ober, MD;  Location: [facility];  Service: General;  Laterality: Left;  . INCISION AND DRAINAGE PERIRECTAL ABSCESS N/A 01/05/2015   Procedure: IRRIGATION AND DEBRIDEMENT PERIRECTAL ABSCESS;  Surgeon: Donnie Mesa, MD;  Location: [facility];  Service: General;  Laterality: N/A;  . IRRIGATION AND DEBRIDEMENT ABSCESS Left 07/06/2012   Procedure: IRRIGATION AND DEBRIDEMENT ABSCESS BUTTOCKS AND THIGH;  Surgeon: Shann Medal, MD;  Location: [facility];  Service: General;  Laterality: Left;  . IRRIGATION AND DEBRIDEMENT ABSCESS Left 08/10/2012   Procedure: IRRIGATION AND DEBRIDEMENT ABSCESS;  Surgeon: Madilyn Hook, DO;  Location: [facility];  Service: General;  Laterality: Left;  . STUMP REVISION Left 06/09/2017   Procedure: REVISION LEFT BELOW KNEE AMPUTATION;  Surgeon: Newt Minion, MD;  Location: [facility];  Service: Orthopedics;  Laterality: Left;  . TONSILLECTOMY  1994   Social History   Occupational History  . Occupation: disability  Tobacco Use  . Smoking status: Current Every Day Smoker    Packs/day: 0.50    Years: 16.00    Pack years: 8.00    Types: Cigarettes  . Smokeless tobacco: Never Used  Substance and Sexual Activity  . Alcohol use: Yes    Alcohol/week: 0.0 standard drinks    Comment: Socially-" monthy  maybe"  . Drug use: No  . Sexual activity: Yes

## 2018-09-17 ENCOUNTER — Other Ambulatory Visit: Payer: Self-pay | Admitting: Internal Medicine

## 2018-09-17 DIAGNOSIS — B373 Candidiasis of vulva and vagina: Secondary | ICD-10-CM

## 2018-09-17 DIAGNOSIS — B3731 Acute candidiasis of vulva and vagina: Secondary | ICD-10-CM

## 2018-09-24 ENCOUNTER — Other Ambulatory Visit: Payer: Self-pay | Admitting: Internal Medicine

## 2018-09-24 DIAGNOSIS — M170 Bilateral primary osteoarthritis of knee: Secondary | ICD-10-CM

## 2018-09-24 DIAGNOSIS — Z89512 Acquired absence of left leg below knee: Secondary | ICD-10-CM

## 2018-09-24 MED ORDER — OXYCODONE-ACETAMINOPHEN 5-325 MG PO TABS: 1.0000 | ORAL_TABLET | Freq: Every day | ORAL | 0 refills | Status: DC | PRN

## 2018-09-24 NOTE — Telephone Encounter (Signed)
Pt needs a refill on oxyCODONE-acetaminophen (PERCOCET/ROXICET) 5-325 MG tablet  ;pt contact [phone] [place], [place] - [address]

## 2018-09-25 NOTE — Telephone Encounter (Signed)
Per Epic she has re-scheduled an appt for 6/18 with Dr Tarri Abernethy.

## 2018-09-27 ENCOUNTER — Encounter: Payer: Self-pay | Admitting: Internal Medicine

## 2018-10-22 ENCOUNTER — Other Ambulatory Visit: Payer: Self-pay | Admitting: Internal Medicine

## 2018-10-22 DIAGNOSIS — M170 Bilateral primary osteoarthritis of knee: Secondary | ICD-10-CM

## 2018-10-22 DIAGNOSIS — Z89512 Acquired absence of left leg below knee: Secondary | ICD-10-CM

## 2018-10-22 NOTE — Telephone Encounter (Signed)
Refill Request  oxyCODONE-acetaminophen (PERCOCET/ROXICET) 5-325 MG tablet   [place], [place] [facility] [place]

## 2018-10-23 ENCOUNTER — Other Ambulatory Visit: Payer: Self-pay | Admitting: Internal Medicine

## 2018-10-23 ENCOUNTER — Encounter: Payer: Self-pay | Admitting: Orthopedic Surgery

## 2018-10-23 ENCOUNTER — Ambulatory Visit (INDEPENDENT_AMBULATORY_CARE_PROVIDER_SITE_OTHER): Payer: Medicare Other | Admitting: Orthopedic Surgery

## 2018-10-23 ENCOUNTER — Other Ambulatory Visit: Payer: Self-pay

## 2018-10-23 VITALS — Ht 70.0 in | Wt 294.0 lb

## 2018-10-23 DIAGNOSIS — M726 Necrotizing fasciitis: Secondary | ICD-10-CM | POA: Diagnosis not present

## 2018-10-23 DIAGNOSIS — Z89512 Acquired absence of left leg below knee: Secondary | ICD-10-CM

## 2018-10-23 DIAGNOSIS — M170 Bilateral primary osteoarthritis of knee: Secondary | ICD-10-CM

## 2018-10-23 DIAGNOSIS — E1165 Type 2 diabetes mellitus with hyperglycemia: Secondary | ICD-10-CM

## 2018-10-23 MED ORDER — ALUMINUM CHLORIDE 20 % EX SOLN: Freq: Every day | CUTANEOUS | 0 refills | Status: DC

## 2018-10-23 NOTE — Progress Notes (Signed)
Office Visit Note   Patient: [name] [name]           Date of Birth: 05/03/1976           MRN: [id number] Visit Date: 10/23/2018              Requested by: Ina Homes, MD [address] [place], [place] [address] PCP: Ina Homes, MD  Chief Complaint  Patient presents with  . Left Leg - Follow-up    BKA- [facility] ill fitting prosthetic.       HPI: The patient is a 43 yo woman who is seen today for concerns of skin breakdown to her residual limb as well as concerns for an ill fitting prosthesis.  She follows with [facility] for new prosthesis needs.  States that the socket is ill fitting shoes having to use more ply as the day goes on up to 15 ply at the end of the day she is having significant movement within her socket increasing tenderness from endbearing.  Reports weeping to her residual limb  Assessment & Plan: Visit Diagnoses:  1. Necrotizing fasciitis s/p OR debridements   2. Status post below knee amputation, left ([place])     Plan: Provided an order for new prosthetic to [facility] this was faxed today.  She will continue to closely monitor her residual limb weightbearing as tolerated feel that the pain will resolve with fabrication of a new prosthesis with better contact in her socket.  Discussed using Drysol for sweating.  She will follow up prn for questions or concerns.   Follow-Up Instructions: Return if symptoms worsen or fail to improve.   Ortho Exam  Patient is alert, oriented, no adenopathy, well-dressed, normal affect, normal respiratory effort. On examination of the residual limb this is well-healed as for the incision.  There is some maceration to her residual limb from moisture inside her liner.  There is 1 small anterior ulcer this is 1 cm in length 1 mm in width 1 mm of depth this has no surrounding erythema no drainage no odor no active drainage today.    Imaging: No results found. No images are attached to the encounter.  Labs: Lab  Results  Component Value Date   HGBA1C 6.5 (A) 06/28/2018   HGBA1C 15.5 (H) 04/05/2018   HGBA1C 11.4 (A) 11/30/2017   ESRSEDRATE 130 (H) 03/24/2017   ESRSEDRATE 53 (H) 09/14/2015   ESRSEDRATE 132 (H) 07/06/2012   CRP 38.6 (H) 04/03/2018   CRP 5.4 (H) 09/14/2015   CRP 23.0 (H) 07/06/2012   REPTSTATUS 04/10/2018 FINAL 04/05/2018   REPTSTATUS 04/10/2018 FINAL 04/05/2018   GRAMSTAIN  08/11/2017    MODERATE WBC PRESENT, PREDOMINANTLY PMN FEW GRAM POSITIVE COCCI    CULT  04/05/2018    NO GROWTH 5 DAYS Performed at [facility], [place] [address] [place], [place] [address]    CULT  04/05/2018    NO GROWTH 5 DAYS Performed at [facility] [address] [place], [place] [address]    LABORGA GROUP B STREP(S.AGALACTIAE)ISOLATED 04/03/2018     Lab Results  Component Value Date   ALBUMIN <1.0 (L) 04/11/2018   ALBUMIN 1.6 (L) 04/03/2018   ALBUMIN 1.9 (L) 08/11/2017    Body mass index is 42.18 kg/m.  Orders:  No orders of the defined types were placed in this encounter.  No orders of the defined types were placed in this encounter.    Procedures: No procedures performed  Clinical Data:  No additional findings.  ROS:  All other systems negative, except as noted in the HPI. Review of Systems  Constitutional: Negative for chills and fever.  Cardiovascular: Negative for leg swelling.  Skin: Positive for wound. Negative for color change.    Objective: Vital Signs: Ht 5\' 10"  (1.778 m)   Wt 294 lb (133.4 kg)   BMI 42.18 kg/m   Specialty Comments:  No specialty comments available.  PMFS History: Patient Active Problem List   Diagnosis Date Noted  . Current moderate episode of major depressive disorder ([place])   . Morbid obesity ([place])   . Anemia of chronic disease   . Debility 04/10/2018  . Bacteremia due to group B Streptococcus 04/04/2018  . Severe protein-calorie malnutrition ([place])   . BKA stump complication ([place]) 62/83/1517  . Herpes genitalis in  women 01/31/2018  . Herpes simplex type 1 infection 01/16/2018  . Status post below knee amputation, left ([place]) 04/11/2017  . Bipolar affective disorder ([place])   . Adjustment disorder with depressed mood 03/26/2017  . Abnormal ECG 03/24/2017  . Heart failure with preserved ejection fraction ([place]) 02/09/2017  . Routine adult health maintenance 12/08/2016  . Chronic pain of right knee 09/08/2016  . Chronic kidney disease (CKD), stage III (moderate) ([place]) 07/15/2016  . OSA (obstructive sleep apnea) 05/13/2016  . Morbid obesity due to excess calories ([place]) 11/27/2015  . History of Low serum cortisol level ([place]) 01/07/2015  . Hypothyroidism 04/25/2013  . Financial difficulty 04/25/2013  . Uncontrolled type 2 diabetes mellitus ([place]) 07/12/2012  . Carpal tunnel syndrome, bilateral 01/25/2012  . Diabetic peripheral neuropathy ([place]) 07/04/2011  . Anxiety and depression 06/02/2011  . History of abnormal cervical Pap smear 12/13/2007  . GERD 12/06/2007  . Hyperlipidemia 08/29/2007  . Hypertension associated with diabetes ([place]) 08/29/2007   Past Medical History:  Diagnosis Date  . Abdominal muscle pain 09/08/2016  . Abnormal Pap smear of cervix 2009  . Abscess    history of multiple abscesses  . Acute bilateral low back pain 02/13/2017  . Acute blood loss anemia   . Acute renal failure ([place]) 07/12/2012  . AKI (acute kidney injury) ([place])   . Anemia of chronic disease 2002  . Anxiety    Panic attacks  . Bilateral lower extremity edema 05/13/2016  . Bipolar disorder ([place])   . Cellulitis 05/21/2014   right eye  . Chronic bronchitis ([place])    "get it q yr" (05/13/2013)  . Chronic pain   . Depression   . Edema of lower extremity   . Endocarditis 2002   subacute bacterial endocarditis.   . Family history of anesthesia complication    "my mom has a hard time coming out from under"  . Fibromyalgia   . GERD (gastroesophageal reflux disease)    occ  . Heart murmur   . History of blood  transfusion    "just low blood count" (05/13/2013)  . Hyperlipidemia   . Hypertension   . Hypothyroidism   . Hypothyroidism, adult 03/21/2014  . Leukocytosis   . Necrosis ([place])    and ulceration  . Necrotizing fasciitis s/p OR debridements 07/06/2012  . Obesity   . OSA on CPAP    does not wear CPAP  . Peripheral neuropathy   . Type II diabetes mellitus (HCC)    Type  II    Family History  Problem Relation Age of Onset  . Heart failure Mother   . Diabetes Mother   . Kidney disease Mother   .  Kidney disease Father   . Diabetes Father   . Diabetes Paternal Grandmother   . Heart failure Paternal Grandmother   . Other Other     Past Surgical History:  Procedure Laterality Date  . AMPUTATION Left 04/07/2017   Procedure: LEFT BELOW KNEE AMPUTATION;  Surgeon: Newt Minion, MD;  Location: [facility];  Service: Orthopedics;  Laterality: Left;  . EYE SURGERY     lazer  . INCISION AND DRAINAGE ABSCESS     multiple I&Ds  . INCISION AND DRAINAGE ABSCESS Left 07/09/2012   Procedure: DRESSING CHANGE, THIGH WOUND;  Surgeon: Harl Bowie, MD;  Location: [facility];  Service: General;  Laterality: Left;  . INCISION AND DRAINAGE OF WOUND Left 07/07/2012   Procedure: IRRIGATION AND DEBRIDEMENT WOUND;  Surgeon: Harl Bowie, MD;  Location: [facility];  Service: General;  Laterality: Left;  . INCISION AND DRAINAGE PERIRECTAL ABSCESS Left 07/14/2012   Procedure: DEBRIDEMENT OF SKIN & SOFT TISSUE; DRESSING CHANGE UNDER ANESTHESIA;  Surgeon: Gayland Curry, MD,FACS;  Location: [facility];  Service: General;  Laterality: Left;  . INCISION AND DRAINAGE PERIRECTAL ABSCESS Left 07/16/2012   Procedure: I&D Left Thigh;  Surgeon: Gwenyth Ober, MD;  Location: [facility];  Service: General;  Laterality: Left;  . INCISION AND DRAINAGE PERIRECTAL ABSCESS N/A 01/05/2015   Procedure: IRRIGATION AND DEBRIDEMENT PERIRECTAL ABSCESS;  Surgeon: Donnie Mesa, MD;  Location: [facility];  Service: General;  Laterality: N/A;  . IRRIGATION  AND DEBRIDEMENT ABSCESS Left 07/06/2012   Procedure: IRRIGATION AND DEBRIDEMENT ABSCESS BUTTOCKS AND THIGH;  Surgeon: Shann Medal, MD;  Location: [facility];  Service: General;  Laterality: Left;  . IRRIGATION AND DEBRIDEMENT ABSCESS Left 08/10/2012   Procedure: IRRIGATION AND DEBRIDEMENT ABSCESS;  Surgeon: Madilyn Hook, DO;  Location: [facility];  Service: General;  Laterality: Left;  . STUMP REVISION Left 06/09/2017   Procedure: REVISION LEFT BELOW KNEE AMPUTATION;  Surgeon: Newt Minion, MD;  Location: [facility];  Service: Orthopedics;  Laterality: Left;  . TONSILLECTOMY  1994   Social History   Occupational History  . Occupation: disability  Tobacco Use  . Smoking status: Current Every Day Smoker    Packs/day: 0.50    Years: 16.00    Pack years: 8.00    Types: Cigarettes  . Smokeless tobacco: Never Used  Substance and Sexual Activity  . Alcohol use: Yes    Alcohol/week: 0.0 standard drinks    Comment: Socially-" monthy  maybe"  . Drug use: No  . Sexual activity: Yes

## 2018-11-05 ENCOUNTER — Telehealth: Payer: Self-pay | Admitting: Orthopedic Surgery

## 2018-11-05 NOTE — Telephone Encounter (Signed)
Patient was called and [name] [name] wrote another Rx for [facility]. Will fax today.

## 2018-11-05 NOTE — Telephone Encounter (Signed)
Patient called advised she need a Rx sent over to [facility] for her prosthetic left BKA. Patient said it is to big  The number to contact patient is [phone]

## 2018-11-08 ENCOUNTER — Encounter: Payer: Self-pay | Admitting: Internal Medicine

## 2018-11-08 ENCOUNTER — Other Ambulatory Visit: Payer: Self-pay

## 2018-11-08 ENCOUNTER — Ambulatory Visit (INDEPENDENT_AMBULATORY_CARE_PROVIDER_SITE_OTHER): Payer: Medicare Other | Admitting: Internal Medicine

## 2018-11-08 VITALS — BP 171/88 | HR 92 | Temp 98.7°F | Ht 70.0 in | Wt 302.1 lb

## 2018-11-08 DIAGNOSIS — Z79891 Long term (current) use of opiate analgesic: Secondary | ICD-10-CM

## 2018-11-08 DIAGNOSIS — F17210 Nicotine dependence, cigarettes, uncomplicated: Secondary | ICD-10-CM

## 2018-11-08 DIAGNOSIS — E1165 Type 2 diabetes mellitus with hyperglycemia: Secondary | ICD-10-CM | POA: Diagnosis not present

## 2018-11-08 DIAGNOSIS — Z89512 Acquired absence of left leg below knee: Secondary | ICD-10-CM

## 2018-11-08 DIAGNOSIS — Z79899 Other long term (current) drug therapy: Secondary | ICD-10-CM | POA: Diagnosis not present

## 2018-11-08 DIAGNOSIS — I10 Essential (primary) hypertension: Secondary | ICD-10-CM

## 2018-11-08 DIAGNOSIS — G546 Phantom limb syndrome with pain: Secondary | ICD-10-CM

## 2018-11-08 DIAGNOSIS — G8929 Other chronic pain: Secondary | ICD-10-CM | POA: Diagnosis not present

## 2018-11-08 DIAGNOSIS — E1159 Type 2 diabetes mellitus with other circulatory complications: Secondary | ICD-10-CM | POA: Diagnosis not present

## 2018-11-08 DIAGNOSIS — Z794 Long term (current) use of insulin: Secondary | ICD-10-CM | POA: Diagnosis not present

## 2018-11-08 LAB — POCT GLYCOSYLATED HEMOGLOBIN (HGB A1C): Hemoglobin A1C: 7 % — AB (ref 4.0–5.6)

## 2018-11-08 LAB — GLUCOSE, CAPILLARY: Glucose-Capillary: 145 mg/dL — ABNORMAL HIGH (ref 70–99)

## 2018-11-08 MED ORDER — PREGABALIN 50 MG PO CAPS: 50.0000 mg | ORAL_CAPSULE | Freq: Two times a day (BID) | ORAL | 1 refills | Status: DC

## 2018-11-08 MED ORDER — INSULIN ASPART 100 UNIT/ML FLEXPEN: 14.0000 [IU] | PEN_INJECTOR | Freq: Three times a day (TID) | SUBCUTANEOUS | 6 refills | Status: DC

## 2018-11-08 NOTE — Assessment & Plan Note (Addendum)
Uncontrolled hypertension. Currently on amlodipine 10 mg q.d., metoprolol tartrate 12.5 mg BID, and lisinopril 10 mg q.d. She is not experiencing any side effects with these medications. She denies orthostatic symptoms. She is not had any issues affording her medications. During her last visit she was started on lisinopril. We do not have a BMP since that time. Review of records indicates that she is chronically elevated with systolics above 761.   Plan today will be to repeat a BMP to check her renal function and potassium. If these are at baseline we will increase her lisinopril 20 mg daily. She is interested in combination pills to decrease her pill burden. We will do this once her BP is controlled.

## 2018-11-08 NOTE — Patient Instructions (Addendum)
GREAT WORK WITH THE WEIGHT LOSS AND YOUR DIABETES! Keep up the good work.   Thank you for allowing me to provide your care.   Today we are increasing your lyrica to help with your pain. Please let me know if this makes you too sleepy.   We will need to increase your lisinopril but I would like to check some blood work first. I will call you tomorrow with the new strength of the medication.   Once you run out of your Novolog give me a call and we will start your new insulin combination medicine.

## 2018-11-08 NOTE — Assessment & Plan Note (Addendum)
Well controlled type II diabetes. A1c is seven today. She is currently on NovoLog 14 units TID WC and Victoza. She is not on any long acting insulin. She states that she has been rotating her injection sites. She denies signs or symptoms of hypoglycemia. She did not bring in her glucose monitor today. She has lost approximately 48 pounds since the beginning of the year. She has a prosthetic for left below the knee amputation however it is still poor fitting. She is planning to get a new prosthetic in the coming months will hopefully become more active and continue to work on weight loss.  Plan will be to continue NovoLog 14 units TID WC and Victoza. We did discuss Xultophy. She is interested in switching once her current medicine is out.

## 2018-11-08 NOTE — Progress Notes (Signed)
   CC: F/u DM, phantom limb pain, and HTN  HPI:  [name] [name] is a 43 y.o. female with PMHx listed below presenting for diabetes mellitus and hypertension. Please see the A&P for the status of the patient's chronic medical problems.  Past Medical History:  Diagnosis Date  . Abdominal muscle pain 09/08/2016  . Abnormal Pap smear of cervix 2009  . Abscess    history of multiple abscesses  . Acute bilateral low back pain 02/13/2017  . Acute blood loss anemia   . Acute renal failure ([place]) 07/12/2012  . AKI (acute kidney injury) ([place])   . Anemia of chronic disease 2002  . Anxiety    Panic attacks  . Bilateral lower extremity edema 05/13/2016  . Bipolar disorder ([place])   . Cellulitis 05/21/2014   right eye  . Chronic bronchitis ([place])    "get it q yr" (05/13/2013)  . Chronic pain   . Depression   . Edema of lower extremity   . Endocarditis 2002   subacute bacterial endocarditis.   . Family history of anesthesia complication    "my mom has a hard time coming out from under"  . Fibromyalgia   . GERD (gastroesophageal reflux disease)    occ  . Heart murmur   . History of blood transfusion    "just low blood count" (05/13/2013)  . Hyperlipidemia   . Hypertension   . Hypothyroidism   . Hypothyroidism, adult 03/21/2014  . Leukocytosis   . Necrosis ([place])    and ulceration  . Necrotizing fasciitis s/p OR debridements 07/06/2012  . Obesity   . OSA on CPAP    does not wear CPAP  . Peripheral neuropathy   . Type II diabetes mellitus ([place])    Type  II   Review of Systems:  Performed and all others negative.  Physical Exam: Vitals:   11/08/18 1359  BP: (!) 171/88  Pulse: 92  Temp: 98.7 F (37.1 C)  TempSrc: Axillary  SpO2: 94%  Weight: (!) 302 lb 1.6 oz (137 kg)  Height: 5\' 10"  (1.778 m)   General: Well nourished female in no acute distress Pulm: Good air movement with no wheezing or crackles  CV: RRR, no murmurs, no rubs   Assessment & Plan:   See  Encounters Tab for problem based charting.  Patient discussed with Dr. Lynnae January

## 2018-11-08 NOTE — Assessment & Plan Note (Addendum)
Continues to have phantom limb pain after her left below the knee amputation. She is currently on Lyrica and Percocet. She states that both of these help. She is currently on Lyrica 25 mg BID and Percocet 5 mg 1 to 2 tablets daily. She was previously referred to the pain clinic however we will manage her chronic pain here. She is not having any side effects from her medications.   Plan will be to increase her Lyrica to 50 mg twice daily and to continue her Percocet 5 mg 1 to 2 tablets daily PRN. She will need the pain contract at her next visit.

## 2018-11-09 LAB — BMP8+ANION GAP
Anion Gap: 11 mmol/L (ref 10.0–18.0)
BUN/Creatinine Ratio: 17 (ref 9–23)
BUN: 24 mg/dL (ref 6–24)
CO2: 22 mmol/L (ref 20–29)
Calcium: 8.6 mg/dL — ABNORMAL LOW (ref 8.7–10.2)
Chloride: 109 mmol/L — ABNORMAL HIGH (ref 96–106)
Creatinine, Ser: 1.4 mg/dL — ABNORMAL HIGH (ref 0.57–1.00)
GFR calc Af Amer: 53 mL/min/{1.73_m2} — ABNORMAL LOW (ref >59)
GFR calc non Af Amer: 46 mL/min/{1.73_m2} — ABNORMAL LOW (ref >59)
Glucose: 131 mg/dL — ABNORMAL HIGH (ref 65–99)
Potassium: 5.1 mmol/L (ref 3.5–5.2)
Sodium: 142 mmol/L (ref 134–144)

## 2018-11-20 ENCOUNTER — Other Ambulatory Visit: Payer: Self-pay

## 2018-11-20 ENCOUNTER — Other Ambulatory Visit: Payer: Self-pay | Admitting: Internal Medicine

## 2018-11-20 DIAGNOSIS — I998 Other disorder of circulatory system: Secondary | ICD-10-CM

## 2018-11-20 DIAGNOSIS — Z89512 Acquired absence of left leg below knee: Secondary | ICD-10-CM

## 2018-11-20 DIAGNOSIS — M170 Bilateral primary osteoarthritis of knee: Secondary | ICD-10-CM

## 2018-11-22 ENCOUNTER — Telehealth (HOSPITAL_COMMUNITY): Payer: Self-pay | Admitting: Rehabilitation

## 2018-11-22 ENCOUNTER — Telehealth: Payer: Self-pay

## 2018-11-22 NOTE — Telephone Encounter (Signed)
The above patient or their representative was contacted and gave the following answers to these questions:         Do you have any of the following symptoms? Cough, due to smoking- nothing new.  Fever                    Cough                   Shortness of breath  Do  you have any of the following other symptoms? No  muscle pain         vomiting,        diarrhea        rash         weakness        red eye        abdominal pain         bruising          bruising or bleeding              joint pain           severe headache   Have you been in contact with someone who was or has been sick in the past 2 weeks? No Yes                 Unsure                         Unable to assess   Does the person that you were in contact with have any of the following symptoms?  Cough         shortness of breath           muscle pain         vomiting,            diarrhea            rash            weakness           fever            red eye           abdominal pain          bruising  or  bleeding                joint pain                severe headache             Have you  or someone you have been in contact with traveled internationally in the last month?  No      If yes, which countries?  Have you  or someone you have been in contact with traveled outside [place] in the last month?  No      If yes, which state and city?  COMMENTS OR ACTION PLAN FOR THIS PATIENT:

## 2018-11-22 NOTE — Telephone Encounter (Signed)
Opened in error. SChaplin, RN,BSN  

## 2018-11-26 ENCOUNTER — Encounter: Payer: Medicare Other | Admitting: Surgery

## 2018-11-26 ENCOUNTER — Encounter: Payer: Self-pay | Admitting: Family

## 2018-11-26 ENCOUNTER — Encounter (HOSPITAL_COMMUNITY): Payer: Medicare Other

## 2018-11-26 NOTE — Progress Notes (Signed)
Internal Medicine Clinic Attending  Case discussed with Dr. Helberg at the time of the visit.  We reviewed the resident's history and exam and pertinent patient test results.  I agree with the assessment, diagnosis, and plan of care documented in the resident's note.    

## 2018-11-27 ENCOUNTER — Telehealth: Payer: Self-pay | Admitting: Orthopedic Surgery

## 2018-11-27 NOTE — Telephone Encounter (Signed)
Patient left a voicemail stating she was [facility].  Patient states she cancelled her appointment that was scheduled for 11/26/18 because her limb was sore and she was unable to get it into her prosthetic.  Patient states she was referred there because she had a black spot which has cleared up.  Patient states she does have swelling and is requesting a return call to let her know if she should reschedule the appointment or if she should be seen by Dr. Sharol Given.

## 2018-11-28 NOTE — Telephone Encounter (Signed)
Spoke with patient, she will keep appointment with vein and vascular for august, she will follow with [place] for any new concerns. Current issue with right ankle has resolved

## 2018-11-28 NOTE — Telephone Encounter (Signed)
Erin please advise, thank you.  

## 2018-12-03 ENCOUNTER — Telehealth: Payer: Self-pay | Admitting: *Deleted

## 2018-12-03 NOTE — Telephone Encounter (Signed)
Patient called in asking for appt for left BKA stump swelling. Offered appt today in [facility]. Patient states she does not have transportation today. Appt given tomorrow in [facility] at 3:45. Hubbard Hartshorn, RN, BSN

## 2018-12-04 ENCOUNTER — Ambulatory Visit (INDEPENDENT_AMBULATORY_CARE_PROVIDER_SITE_OTHER): Payer: Medicare Other | Admitting: Internal Medicine

## 2018-12-04 ENCOUNTER — Ambulatory Visit (HOSPITAL_COMMUNITY)
Admission: RE | Admit: 2018-12-04 | Discharge: 2018-12-04 | Disposition: A | Discharge status: Home / Self Care | Payer: Medicare Other | Source: Ambulatory Visit | Attending: Student in an Organized Health Care Education/Training Program | Admitting: Student in an Organized Health Care Education/Training Program

## 2018-12-04 ENCOUNTER — Other Ambulatory Visit: Payer: Self-pay

## 2018-12-04 VITALS — BP 119/99 | HR 120 | Temp 97.9°F | Ht 70.0 in | Wt 303.2 lb

## 2018-12-04 DIAGNOSIS — Z89512 Acquired absence of left leg below knee: Secondary | ICD-10-CM

## 2018-12-04 DIAGNOSIS — T8789 Other complications of amputation stump: Secondary | ICD-10-CM | POA: Diagnosis not present

## 2018-12-04 DIAGNOSIS — I5030 Unspecified diastolic (congestive) heart failure: Secondary | ICD-10-CM

## 2018-12-04 DIAGNOSIS — R609 Edema, unspecified: Secondary | ICD-10-CM | POA: Insufficient documentation

## 2018-12-04 DIAGNOSIS — R000 Tachycardia, unspecified: Secondary | ICD-10-CM | POA: Insufficient documentation

## 2018-12-04 DIAGNOSIS — I5032 Chronic diastolic (congestive) heart failure: Secondary | ICD-10-CM | POA: Insufficient documentation

## 2018-12-04 DIAGNOSIS — F17210 Nicotine dependence, cigarettes, uncomplicated: Secondary | ICD-10-CM

## 2018-12-04 MED ORDER — BUMETANIDE 1 MG PO TABS: 1.0000 mg | ORAL_TABLET | Freq: Two times a day (BID) | ORAL | 0 refills | Status: DC

## 2018-12-04 NOTE — Assessment & Plan Note (Addendum)
Per chart review, Bumex stopped 04/2018 after hospital discharge. due to hyponatremia) Patient presented with stump swelling. Has LEE at left stump and right LEE. Her weight has increased since last month. No indication for holding Bumex and I resume it today. Will reevaluate patient in clinic in a week.

## 2018-12-04 NOTE — Patient Instructions (Signed)
It was our pleasure taking care of you in our clinic today.  You were seen due to stump swelling.  There is no sign of infection at your stump area and the swelling is likely due to extra fluid.  I restart fluid pill (Bumex) for you. Please take 1 tablet in the morning and 1 tablet in the afternoon (as before).   Please take rest of your medications as before. We also performed an EKG because your heart rate was up, there was no concerning finding in your EKG.  Please come back to clinic in 7-10 days or earlier if your symptoms get worse or not improved. As always, if having severe symptoms, please seek medical attention at emergency room. Please contact us if you have any question or concern.    Thanks, Dr. Linna Hoff

## 2018-12-04 NOTE — Assessment & Plan Note (Signed)
Patient presented with swelling of stump that started a week ago. Denies any pain in the area. No erythema, no tenderness. No wound and no evidence of infection. Seems to be due to volume overload. Resuming Bumex and will f/u in clinic in a week (Please refer to today's note under HFpEF in problem list.)

## 2018-12-04 NOTE — Progress Notes (Signed)
CC: Stump swelling  HPI:  [name] is a 43 y.o. with PMHx as documented below, presented with stump swelling. Please refer to problem based charting for further details and assessment of plan of current problem and chronic medical conditions.   Past Medical History:  Diagnosis Date  . Abdominal muscle pain 09/08/2016  . Abnormal Pap smear of cervix 2009  . Abscess    history of multiple abscesses  . Acute bilateral low back pain 02/13/2017  . Acute blood loss anemia   . Acute renal failure ([place]) 07/12/2012  . AKI (acute kidney injury) ([place])   . Anemia of chronic disease 2002  . Anxiety    Panic attacks  . Bilateral lower extremity edema 05/13/2016  . Bipolar disorder ([place])   . Cellulitis 05/21/2014   right eye  . Chronic bronchitis ([place])    "get it q yr" (05/13/2013)  . Chronic pain   . Depression   . Edema of lower extremity   . Endocarditis 2002   subacute bacterial endocarditis.   . Family history of anesthesia complication    "my mom has a hard time coming out from under"  . Fibromyalgia   . GERD (gastroesophageal reflux disease)    occ  . Heart murmur   . History of blood transfusion    "just low blood count" (05/13/2013)  . Hyperlipidemia   . Hypertension   . Hypothyroidism   . Hypothyroidism, adult 03/21/2014  . Leukocytosis   . Necrosis ([place])    and ulceration  . Necrotizing fasciitis s/p OR debridements 07/06/2012  . Obesity   . OSA on CPAP    does not wear CPAP  . Peripheral neuropathy   . Type II diabetes mellitus (HCC)    Type  II   FHx: Mother with HF and DM Father with Kidney disease and DM  Review of Systems:  Review of Systems  Constitutional: Negative for chills and fever.  Respiratory: Negative for cough.   Cardiovascular: Positive for leg swelling. Negative for chest pain, palpitations and orthopnea.  Neurological: Negative for dizziness and headaches.    Physical Exam:  Vitals:   12/04/18 1547  BP: (!) 119/99   Pulse: (!) 120  Temp: 97.9 F (36.6 C)  TempSrc: Oral  SpO2: 100%  Weight: (!) 303 lb 3.2 oz (137.5 kg)  Height: 5\' 10"  (1.778 m)   Physical Exam Vitals signs reviewed.  Constitutional:      General: She is not in acute distress.    Appearance: Normal appearance. She is not ill-appearing.  HENT:     Head: Normocephalic and atraumatic.  Eyes:     Conjunctiva/sclera: Conjunctivae normal.  Cardiovascular:     Rate and Rhythm: Regular rhythm. Tachycardia present.     Heart sounds: No murmur.     Comments: 2+ pitting edema at right LE Stump swelling (left) Pulmonary:     Effort: Pulmonary effort is normal. No respiratory distress.     Breath sounds: No wheezing or rales.  Musculoskeletal:        General: Swelling present. No tenderness.     Right lower leg: Edema present.     Comments: Left BKA Swelling of stump (left) 2+ pitting edema at right LE  Skin:    Findings: No erythema or rash.  Neurological:     Mental Status: She is alert and oriented to person, place, and time. Mental status is at baseline.  Psychiatric:        Mood and Affect:  Mood normal.        Behavior: Behavior normal.        Thought Content: Thought content normal.     Assessment & Plan:   See Encounters Tab for problem based charting.  Patient discussed with Dr. Angelia Mould

## 2018-12-04 NOTE — Assessment & Plan Note (Signed)
Patient found to be tachycardia at 120 today. She denies any palpitation, dizziness, chest pain or shortness of breath. On exam: rate is regular.  -EKG--> Sinus tachycardia.  No further work up at this point. She has had some episodes of tachycardia per previous vitals record. Will monitor and if persistent in future, may consider evaluation for anemia or thyroid disease and etc.

## 2018-12-06 NOTE — Progress Notes (Signed)
Internal Medicine Clinic Attending  Case discussed with Dr. Masoudi  at the time of the visit.  We reviewed the resident's history and exam and pertinent patient test results.  I agree with the assessment, diagnosis, and plan of care documented in the resident's note.  

## 2018-12-18 ENCOUNTER — Emergency Department (HOSPITAL_COMMUNITY)
Admission: EM | Admit: 2018-12-18 | Discharge: 2018-12-18 | Discharge status: Against Medical Advice | Payer: Medicare Other | Attending: Emergency Medicine | Admitting: Emergency Medicine

## 2018-12-18 ENCOUNTER — Other Ambulatory Visit: Payer: Self-pay

## 2018-12-18 ENCOUNTER — Encounter (HOSPITAL_COMMUNITY): Payer: Self-pay | Admitting: Emergency Medicine

## 2018-12-18 DIAGNOSIS — Z5321 Procedure and treatment not carried out due to patient leaving prior to being seen by health care provider: Secondary | ICD-10-CM | POA: Insufficient documentation

## 2018-12-18 DIAGNOSIS — I10 Essential (primary) hypertension: Secondary | ICD-10-CM | POA: Diagnosis not present

## 2018-12-18 DIAGNOSIS — R112 Nausea with vomiting, unspecified: Secondary | ICD-10-CM | POA: Diagnosis not present

## 2018-12-18 DIAGNOSIS — R52 Pain, unspecified: Secondary | ICD-10-CM | POA: Diagnosis not present

## 2018-12-18 DIAGNOSIS — R1084 Generalized abdominal pain: Secondary | ICD-10-CM | POA: Diagnosis not present

## 2018-12-18 LAB — CBC
HCT: 42.5 % (ref 36.0–46.0)
Hemoglobin: 13.6 g/dL (ref 12.0–15.0)
MCH: 27.3 pg (ref 26.0–34.0)
MCHC: 32 g/dL (ref 30.0–36.0)
MCV: 85.2 fL (ref 80.0–100.0)
Platelets: 306 10*3/uL (ref 150–400)
RBC: 4.99 MIL/uL (ref 3.87–5.11)
RDW: 12.7 % (ref 11.5–15.5)
WBC: 10.8 10*3/uL — ABNORMAL HIGH (ref 4.0–10.5)
nRBC: 0 % (ref 0.0–0.2)

## 2018-12-18 LAB — URINALYSIS, ROUTINE W REFLEX MICROSCOPIC
Bilirubin Urine: NEGATIVE
Glucose, UA: 150 mg/dL — AB
Ketones, ur: NEGATIVE mg/dL
Leukocytes,Ua: NEGATIVE
Nitrite: NEGATIVE
Protein, ur: >=300 mg/dL — AB
Specific Gravity, Urine: 1.026 (ref 1.005–1.030)
pH: 6 (ref 5.0–8.0)

## 2018-12-18 LAB — COMPREHENSIVE METABOLIC PANEL
ALT: 10 U/L (ref 0–44)
AST: 12 U/L — ABNORMAL LOW (ref 15–41)
Albumin: 2.8 g/dL — ABNORMAL LOW (ref 3.5–5.0)
Alkaline Phosphatase: 72 U/L (ref 38–126)
Anion gap: 9 (ref 5–15)
BUN: 19 mg/dL (ref 6–20)
CO2: 24 mmol/L (ref 22–32)
Calcium: 8.8 mg/dL — ABNORMAL LOW (ref 8.9–10.3)
Chloride: 104 mmol/L (ref 98–111)
Creatinine, Ser: 1.63 mg/dL — ABNORMAL HIGH (ref 0.44–1.00)
GFR calc Af Amer: 45 mL/min — ABNORMAL LOW (ref >60)
GFR calc non Af Amer: 38 mL/min — ABNORMAL LOW (ref >60)
Glucose, Bld: 176 mg/dL — ABNORMAL HIGH (ref 70–99)
Potassium: 4.2 mmol/L (ref 3.5–5.1)
Sodium: 137 mmol/L (ref 135–145)
Total Bilirubin: 0.5 mg/dL (ref 0.3–1.2)
Total Protein: 6.8 g/dL (ref 6.5–8.1)

## 2018-12-18 LAB — LIPASE, BLOOD: Lipase: 41 U/L (ref 11–51)

## 2018-12-18 MED ORDER — SODIUM CHLORIDE 0.9% FLUSH: 3.0000 mL | Freq: Once | INTRAVENOUS | Status: DC

## 2018-12-18 NOTE — ED Notes (Signed)
Pt states she does not want to wait any longer and would like to leave, this RN encouraged pt to stay and be seen by a provider, however, pt refused. Pt taken outside in wheelchair to be picked up by her ride.

## 2018-12-18 NOTE — ED Triage Notes (Signed)
Pt reports generalized abdominal pain w/ N/V/D.  She thought that her "sugar dropped" but CBG was 143.  WHile w/ EMS, she had one episode of emesis but then the abdominal pain resolved.

## 2018-12-19 ENCOUNTER — Other Ambulatory Visit: Payer: Self-pay | Admitting: Internal Medicine

## 2018-12-19 DIAGNOSIS — Z89512 Acquired absence of left leg below knee: Secondary | ICD-10-CM

## 2018-12-19 DIAGNOSIS — M170 Bilateral primary osteoarthritis of knee: Secondary | ICD-10-CM

## 2018-12-19 LAB — I-STAT BETA HCG BLOOD, ED (MC, WL, AP ONLY): I-stat hCG, quantitative: <5 m[IU]/mL (ref <5)

## 2018-12-20 NOTE — Telephone Encounter (Addendum)
Also received faxed request for the following medication: Mupirocin 2% Ointment Apply 1 application to the affected area 2 times daily  Medication no longer on list, please advise.Regenia Skeeter, Darlene Cassady7/30/20203:00 PM

## 2018-12-21 ENCOUNTER — Other Ambulatory Visit: Payer: Self-pay | Admitting: Internal Medicine

## 2018-12-26 NOTE — Addendum Note (Signed)
Addended by: Hulan Fray on: 12/26/2018 05:32 PM   Modules accepted: Orders

## 2018-12-31 ENCOUNTER — Ambulatory Visit (HOSPITAL_COMMUNITY): Payer: Medicare Other | Attending: Surgery

## 2018-12-31 ENCOUNTER — Encounter: Payer: Medicare Other | Admitting: Surgery

## 2018-12-31 ENCOUNTER — Encounter: Payer: Self-pay | Admitting: Family

## 2019-01-03 ENCOUNTER — Encounter: Payer: Medicare Other | Admitting: Internal Medicine

## 2019-01-08 ENCOUNTER — Ambulatory Visit: Payer: Medicare Other

## 2019-01-10 ENCOUNTER — Ambulatory Visit (HOSPITAL_COMMUNITY)
Admission: EM | Admit: 2019-01-10 | Discharge: 2019-01-10 | Disposition: A | Discharge status: Home / Self Care | Payer: Medicare Other | Attending: Internal Medicine | Admitting: Internal Medicine

## 2019-01-10 ENCOUNTER — Other Ambulatory Visit: Payer: Self-pay

## 2019-01-10 ENCOUNTER — Encounter (HOSPITAL_COMMUNITY): Payer: Self-pay

## 2019-01-10 ENCOUNTER — Ambulatory Visit: Payer: Medicare Other

## 2019-01-10 ENCOUNTER — Encounter: Payer: Self-pay | Admitting: Internal Medicine

## 2019-01-10 DIAGNOSIS — I10 Essential (primary) hypertension: Secondary | ICD-10-CM | POA: Diagnosis not present

## 2019-01-10 DIAGNOSIS — Z202 Contact with and (suspected) exposure to infections with a predominantly sexual mode of transmission: Secondary | ICD-10-CM | POA: Insufficient documentation

## 2019-01-10 DIAGNOSIS — Z3202 Encounter for pregnancy test, result negative: Secondary | ICD-10-CM | POA: Diagnosis not present

## 2019-01-10 DIAGNOSIS — Z113 Encounter for screening for infections with a predominantly sexual mode of transmission: Secondary | ICD-10-CM | POA: Diagnosis not present

## 2019-01-10 DIAGNOSIS — F411 Generalized anxiety disorder: Secondary | ICD-10-CM | POA: Diagnosis not present

## 2019-01-10 DIAGNOSIS — N898 Other specified noninflammatory disorders of vagina: Secondary | ICD-10-CM

## 2019-01-10 LAB — POCT URINALYSIS DIP (DEVICE)
Glucose, UA: 250 mg/dL — AB
Ketones, ur: NEGATIVE mg/dL
Leukocytes,Ua: NEGATIVE
Nitrite: NEGATIVE
Protein, ur: >=300 mg/dL — AB
Specific Gravity, Urine: >=1.03 (ref 1.005–1.030)
Urobilinogen, UA: 0.2 mg/dL (ref 0.0–1.0)
pH: 5.5 (ref 5.0–8.0)

## 2019-01-10 LAB — POCT PREGNANCY, URINE: Preg Test, Ur: NEGATIVE

## 2019-01-10 MED ORDER — HYDROXYZINE HCL 25 MG PO TABS: 25.0000 mg | ORAL_TABLET | Freq: Four times a day (QID) | ORAL | 0 refills | Status: DC | PRN

## 2019-01-10 NOTE — ED Provider Notes (Signed)
[facility]    CSN: [id number] Arrival date & time: 01/10/19  1504      History   Chief Complaint Chief Complaint  Patient presents with   Exposure to STD    HPI [name] [name] is a 43 y.o. female with a history of hypertension on antihypertensive medications, diabetes mellitus type 2 on insulin comes to urgent care to be tested for HIV and other STDs.  Patient was engaged in unprotected sexual intercourse on 3 occasions with the HIV positive individual about 4 weeks ago.  Patient did not know the HIV status of the person she had sexual intercourse with.  She got to know his status about 3 weeks ago but decided to come in today to get tested.  She also complains of vaginal discharge which is foul-smelling.  No pruritus.  No abdominal pain nausea vomiting.  No fever or chills.  HPI  Past Medical History:  Diagnosis Date   Abdominal muscle pain 09/08/2016   Abnormal Pap smear of cervix 2009   Abscess    history of multiple abscesses   Acute bilateral low back pain 02/13/2017   Acute blood loss anemia    Acute renal failure ([place]) 07/12/2012   AKI (acute kidney injury) ([place])    Anemia of chronic disease 2002   Anxiety    Panic attacks   Bilateral lower extremity edema 05/13/2016   Bipolar disorder ([place])    Cellulitis 05/21/2014   right eye   Chronic bronchitis ([place])    "get it q yr" (05/13/2013)   Chronic pain    Depression    Edema of lower extremity    Endocarditis 2002   subacute bacterial endocarditis.    Family history of anesthesia complication    "my mom has a hard time coming out from under"   Fibromyalgia    GERD (gastroesophageal reflux disease)    occ   Heart murmur    History of blood transfusion    "just low blood count" (05/13/2013)   Hyperlipidemia    Hypertension    Hypothyroidism    Hypothyroidism, adult 03/21/2014   Leukocytosis    Necrosis ([place])    and ulceration   Necrotizing fasciitis s/p OR  debridements 07/06/2012   Obesity    OSA on CPAP    does not wear CPAP   Peripheral neuropathy    Type II diabetes mellitus ([place])    Type  II    Patient Active Problem List   Diagnosis Date Noted   Sinus tachycardia 12/04/2018   Phantom limb pain ([place]) 11/08/2018   Current moderate episode of major depressive disorder ([place])    Morbid obesity ([place])    Anemia of chronic disease    Debility 04/10/2018   Bacteremia due to group B Streptococcus 04/04/2018   Severe protein-calorie malnutrition ([place])    Edema of amputation stump of left lower extremity ([place]) 04/03/2018   Herpes genitalis in women 01/31/2018   Herpes simplex type 1 infection 01/16/2018   Status post below knee amputation, left ([place]) 04/11/2017   Bipolar affective disorder ([place])    Adjustment disorder with depressed mood 03/26/2017   Abnormal ECG 03/24/2017   Heart failure with preserved ejection fraction ([place]) 02/09/2017   Routine adult health maintenance 12/08/2016   Chronic pain of right knee 09/08/2016   Chronic kidney disease (CKD), stage III (moderate) ([place]) 07/15/2016   Lower extremity edema 05/13/2016   OSA (obstructive sleep apnea) 05/13/2016   Morbid obesity due to excess calories (  [place]) 11/27/2015   History of Low serum cortisol level ([place]) 01/07/2015   Hypothyroidism 04/25/2013   Financial difficulty 04/25/2013   Controlled type 2 diabetes mellitus ([place]) 07/12/2012   Carpal tunnel syndrome, bilateral 01/25/2012   Diabetic peripheral neuropathy ([place]) 07/04/2011   Anxiety and depression 06/02/2011   History of abnormal cervical Pap smear 12/13/2007   GERD 12/06/2007   Hyperlipidemia 08/29/2007   Hypertension associated with diabetes ([place]) 08/29/2007    Past Surgical History:  Procedure Laterality Date   AMPUTATION Left 04/07/2017   Procedure: LEFT BELOW KNEE AMPUTATION;  Surgeon: Newt Minion, MD;  Location: [facility];  Service: Orthopedics;  Laterality: Left;    EYE SURGERY     lazer   INCISION AND DRAINAGE ABSCESS     multiple I&Ds   INCISION AND DRAINAGE ABSCESS Left 07/09/2012   Procedure: DRESSING CHANGE, THIGH WOUND;  Surgeon: Harl Bowie, MD;  Location: [facility];  Service: General;  Laterality: Left;   INCISION AND DRAINAGE OF WOUND Left 07/07/2012   Procedure: IRRIGATION AND DEBRIDEMENT WOUND;  Surgeon: Harl Bowie, MD;  Location: [facility];  Service: General;  Laterality: Left;   INCISION AND DRAINAGE PERIRECTAL ABSCESS Left 07/14/2012   Procedure: DEBRIDEMENT OF SKIN & SOFT TISSUE; DRESSING CHANGE UNDER ANESTHESIA;  Surgeon: Gayland Curry, MD,FACS;  Location: [facility];  Service: General;  Laterality: Left;   INCISION AND DRAINAGE PERIRECTAL ABSCESS Left 07/16/2012   Procedure: I&D Left Thigh;  Surgeon: Gwenyth Ober, MD;  Location: [facility];  Service: General;  Laterality: Left;   INCISION AND DRAINAGE PERIRECTAL ABSCESS N/A 01/05/2015   Procedure: IRRIGATION AND DEBRIDEMENT PERIRECTAL ABSCESS;  Surgeon: Donnie Mesa, MD;  Location: [facility];  Service: General;  Laterality: N/A;   IRRIGATION AND DEBRIDEMENT ABSCESS Left 07/06/2012   Procedure: IRRIGATION AND DEBRIDEMENT ABSCESS BUTTOCKS AND THIGH;  Surgeon: Shann Medal, MD;  Location: [facility];  Service: General;  Laterality: Left;   IRRIGATION AND DEBRIDEMENT ABSCESS Left 08/10/2012   Procedure: IRRIGATION AND DEBRIDEMENT ABSCESS;  Surgeon: Madilyn Hook, DO;  Location: [facility];  Service: General;  Laterality: Left;   STUMP REVISION Left 06/09/2017   Procedure: REVISION LEFT BELOW KNEE AMPUTATION;  Surgeon: Newt Minion, MD;  Location: [facility];  Service: Orthopedics;  Laterality: Left;   TONSILLECTOMY  1994    OB History   No obstetric history on file.      Home Medications    Prior to Admission medications   Medication Sig Start Date End Date Taking? Authorizing Provider  acetaminophen (TYLENOL) 325 MG tablet Take 1-2 tablets (325-650 mg total) by mouth every 4 (four) hours as  needed for mild pain. 05/01/18   Love, Ivan Anchors, PA-C  aluminum chloride (DRYSOL) 20 % external solution Apply topically at bedtime. 10/23/18   Suzan Slick, NP  amLODipine (NORVASC) 10 MG tablet Take 1 tablet (10 mg total) by mouth daily. 06/29/18   Ina Homes, MD  atorvastatin (LIPITOR) 20 MG tablet Take 1 tablet (20 mg total) by mouth daily. 06/29/18   Ina Homes, MD  blood glucose meter kit and supplies KIT ICD 10- E11.69. Based on patient and insurance preference. Use up to four times daily as directed. 05/01/18   Love, Ivan Anchors, PA-C  bumetanide (BUMEX) 1 MG tablet Take 1 tablet (1 mg total) by mouth 2 (two) times daily. 12/04/18 12/04/19  Masoudi, Dorthula Rue, MD  dextromethorphan-guaiFENesin (MUCINEX DM) 30-600 MG 12hr tablet Take 1 tablet by mouth 2 (two) times daily  as needed for cough. 05/01/18   Love, Ivan Anchors, PA-C  diclofenac sodium (VOLTAREN) 1 % GEL Apply 4 grams to affected area 4 times daily as needed for pain 08/23/18   Ina Homes, MD  FLUoxetine (PROZAC) 20 MG/5ML solution Take 15 mLs (60 mg total) by mouth daily. 06/29/18 06/24/19  Ina Homes, MD  hydrOXYzine (ATARAX/VISTARIL) 25 MG tablet Take 1 tablet (25 mg total) by mouth every 6 (six) hours as needed for anxiety. 01/10/19   Salimatou Simone, Myrene Galas, MD  insulin aspart (NOVOLOG) 100 UNIT/ML FlexPen Inject 14 Units into the skin 3 (three) times daily with meals. 11/08/18   Ina Homes, MD  iron polysaccharides (NIFEREX) 150 MG capsule Take 1 capsule (150 mg total) by mouth 2 (two) times daily before lunch and supper. 06/29/18   Helberg, Larkin Ina, MD  lidocaine (LIDODERM) 5 % Place 1 patch onto the skin daily. To left knee at 7 am and remove at 7 pm 05/01/18   Love, Ivan Anchors, PA-C  liraglutide (VICTOZA) 18 MG/3ML SOPN Inject 0.14ms (1.815mtotal) into the skin daily 10/24/18   HeIna HomesMD  lisinopril (PRINIVIL,ZESTRIL) 10 MG tablet Take 1 tablet (10 mg total) by mouth daily. 06/29/18   HeIna HomesMD  Menthol-Methyl  Salicylate (MUSCLE RUB) 10-15 % CREA Apply 1 application topically 4 (four) times daily as needed for muscle pain. 05/01/18   Love, PaIvan AnchorsPA-C  metoprolol tartrate (LOPRESSOR) 25 MG tablet Take 0.5 tablets (12.5 mg total) by mouth 2 (two) times daily. 06/29/18   HeIna HomesMD  mupirocin ointment (BACTROBAN) 2 % Apply 1 application to affected area 2 times daily 12/22/18   HeIna HomesMD  NYSTATIN powder Apply topically 4 times daily. After cleaning and drying the affected area for two weeks 09/18/18   NaAldine ContesMD  oxyCODONE-acetaminophen (PERCOCET/ROXICET) 5-325 MG tablet Take 1-2 tablets by mouth daily as needed for moderate pain or severe pain. 12/22/18   HeIna HomesMD  polyethylene glycol (MIRALAX / GLYCOLAX) packet Take 17 g by mouth daily. Patient taking differently: Take 17 g by mouth daily as needed for moderate constipation.  04/25/17   Angiulli, DaLavon PaganiniPA-C  pregabalin (LYRICA) 50 MG capsule Take 1 capsule (50 mg total) by mouth 2 (two) times daily. 11/08/18   Helberg, JuLarkin InaMD  senna-docusate (SENOKOT-S) 8.6-50 MG tablet Take 2 tablets by mouth 2 (two) times daily. 05/01/18   Love, PaIvan AnchorsPA-C  simethicone (MYLICON) 40 MGTT/0.1XBrops Take 1.2 mLs (80 mg total) by mouth 4 (four) times daily. 05/01/18   Love, PaIvan AnchorsPA-C    Family History Family History  Problem Relation Age of Onset   Heart failure Mother    Diabetes Mother    Kidney disease Mother    Kidney disease Father    Diabetes Father    Diabetes Paternal Grandmother    Heart failure Paternal Grandmother    Other Other     Social History Social History   Tobacco Use   Smoking status: Current Every Day Smoker    Packs/day: 0.50    Years: 16.00    Pack years: 8.00    Types: Cigarettes   Smokeless tobacco: Never Used  Substance Use Topics   Alcohol use: Yes    Alcohol/week: 0.0 standard drinks    Comment: Socially-" monthy  maybe"   Drug use: No     Allergies     Vancomycin   Review of Systems Review of Systems  Constitutional: Positive for fatigue.  Negative for activity change, chills and fever.  HENT: Negative.   Gastrointestinal: Negative.  Negative for abdominal pain, diarrhea, nausea and vomiting.  Genitourinary: Positive for vaginal discharge. Negative for difficulty urinating, dyspareunia, dysuria, frequency, genital sores, menstrual problem, pelvic pain, urgency and vaginal pain.  Musculoskeletal: Negative for arthralgias and myalgias.  Skin: Negative for rash and wound.  Neurological: Negative.      Physical Exam Triage Vital Signs ED Triage Vitals  Enc Vitals Group     BP 01/10/19 1519 (!) 164/95     Pulse Rate 01/10/19 1519 96     Resp 01/10/19 1519 18     Temp 01/10/19 1519 98.7 F (37.1 C)     Temp Source 01/10/19 1519 Oral     SpO2 01/10/19 1519 100 %     Weight 01/10/19 1518 300 lb (136.1 kg)     Height --      Head Circumference --      Peak Flow --      Pain Score 01/10/19 1518 5     Pain Loc --      Pain Edu? --      Excl. in [place]? --    No data found.  Updated Vital Signs BP (!) 164/95 (BP Location: Right Arm)    Pulse 96    Temp 98.7 F (37.1 C) (Oral)    Resp 18    Wt 136.1 kg    LMP 12/18/2018    SpO2 100%    BMI 43.05 kg/m   Visual Acuity Right Eye Distance:   Left Eye Distance:   Bilateral Distance:    Right Eye Near:   Left Eye Near:    Bilateral Near:     Physical Exam Constitutional:      Comments: Chronically ill looking and looks older than stated age.  Cardiovascular:     Rate and Rhythm: Normal rate and regular rhythm.     Pulses: Normal pulses.     Heart sounds: Normal heart sounds.  Pulmonary:     Effort: Pulmonary effort is normal.     Breath sounds: Normal breath sounds.  Abdominal:     General: Abdomen is flat. Bowel sounds are normal.     Palpations: Abdomen is soft.  Musculoskeletal: Normal range of motion.  Skin:    General: Skin is warm.     Capillary Refill: Capillary  refill takes less than 2 seconds.  Neurological:     General: No focal deficit present.     Mental Status: She is alert and oriented to person, place, and time.      UC Treatments / Results  Labs (all labs ordered are listed, but only abnormal results are displayed) Labs Reviewed  POCT URINALYSIS DIP (DEVICE) - Abnormal; Notable for the following components:      Result Value   Glucose, UA 250 (*)    Bilirubin Urine SMALL (*)    Hgb urine dipstick MODERATE (*)    Protein, ur >=300 (*)    All other components within normal limits  HIV ANTIBODY (ROUTINE TESTING W REFLEX)  RPR  POC URINE PREG, ED  POCT PREGNANCY, URINE  CERVICOVAGINAL ANCILLARY ONLY    EKG   Radiology No results found.  Procedures Procedures (including critical care time)  Medications Ordered in UC Medications - No data to display  Initial Impression / Assessment and Plan / UC Course  I have reviewed the triage vital signs and the nursing notes.  Pertinent labs & imaging results  that were available during my care of the patient were reviewed by me and considered in my medical decision making (see chart for details).    1.  Exposure to STD: HIV, RPR Vaginal swab for GC/chlamydia/trichomonas Safe sex counseling advice was given to the patient. Patient has increasing anxiety because of her situation.  I prescribed Vistaril to be taken as needed for anxiety. Final Clinical Impressions(s) / UC Diagnoses   Final diagnoses:  STD exposure  Generalized anxiety disorder   Discharge Instructions   None    ED Prescriptions    Medication Sig Dispense Auth. Provider   hydrOXYzine (ATARAX/VISTARIL) 25 MG tablet Take 1 tablet (25 mg total) by mouth every 6 (six) hours as needed for anxiety. 40 tablet Osmani Kersten, Myrene Galas, MD     Controlled Substance Prescriptions [place] Controlled Substance Registry consulted? No   Chase Picket, MD 01/10/19 [phone]

## 2019-01-10 NOTE — ED Triage Notes (Signed)
Pt states she was told that the person told she had sex with someone with HIV. Pt states it was un protective sex. Pt states she has a vaginal odor.

## 2019-01-11 ENCOUNTER — Encounter: Payer: Self-pay | Admitting: Internal Medicine

## 2019-01-11 LAB — HIV ANTIBODY (ROUTINE TESTING W REFLEX): HIV Screen 4th Generation wRfx: NONREACTIVE

## 2019-01-11 LAB — RPR: RPR Ser Ql: NONREACTIVE

## 2019-01-12 ENCOUNTER — Telehealth (HOSPITAL_COMMUNITY): Payer: Self-pay | Admitting: *Deleted

## 2019-01-12 LAB — CERVICOVAGINAL ANCILLARY ONLY
Bacterial vaginitis: POSITIVE — AB
Chlamydia: NEGATIVE
Neisseria Gonorrhea: NEGATIVE

## 2019-01-12 NOTE — Telephone Encounter (Signed)
Spoke with patient regarding lab results. Patient denies any vaginal discharge. Spoke with Dr Lanny Cramp regarding test results and no symptoms, will not treat at this time. Patient to call back if she develops any symptoms. Patient had no further questions.

## 2019-01-23 ENCOUNTER — Other Ambulatory Visit: Payer: Self-pay | Admitting: Internal Medicine

## 2019-01-23 DIAGNOSIS — Z89512 Acquired absence of left leg below knee: Secondary | ICD-10-CM

## 2019-01-23 DIAGNOSIS — M170 Bilateral primary osteoarthritis of knee: Secondary | ICD-10-CM

## 2019-02-12 ENCOUNTER — Inpatient Hospital Stay (HOSPITAL_COMMUNITY)
Admission: EM | Admit: 2019-02-12 | Discharge: 2019-02-15 | DRG: 564 | Disposition: A | Discharge status: Home / Self Care | Payer: Medicare Other | Attending: Internal Medicine | Admitting: Internal Medicine

## 2019-02-12 ENCOUNTER — Encounter (HOSPITAL_COMMUNITY): Payer: Self-pay | Admitting: Emergency Medicine

## 2019-02-12 ENCOUNTER — Ambulatory Visit (INDEPENDENT_AMBULATORY_CARE_PROVIDER_SITE_OTHER): Payer: Medicare Other | Admitting: Licensed Clinical Social Worker

## 2019-02-12 ENCOUNTER — Other Ambulatory Visit: Payer: Self-pay

## 2019-02-12 ENCOUNTER — Encounter: Payer: Self-pay | Admitting: Licensed Clinical Social Worker

## 2019-02-12 DIAGNOSIS — F17210 Nicotine dependence, cigarettes, uncomplicated: Secondary | ICD-10-CM | POA: Diagnosis present

## 2019-02-12 DIAGNOSIS — G8929 Other chronic pain: Secondary | ICD-10-CM | POA: Diagnosis present

## 2019-02-12 DIAGNOSIS — E785 Hyperlipidemia, unspecified: Secondary | ICD-10-CM | POA: Diagnosis present

## 2019-02-12 DIAGNOSIS — N183 Chronic kidney disease, stage 3 (moderate): Secondary | ICD-10-CM | POA: Diagnosis present

## 2019-02-12 DIAGNOSIS — L03116 Cellulitis of left lower limb: Secondary | ICD-10-CM | POA: Diagnosis not present

## 2019-02-12 DIAGNOSIS — I130 Hypertensive heart and chronic kidney disease with heart failure and stage 1 through stage 4 chronic kidney disease, or unspecified chronic kidney disease: Secondary | ICD-10-CM | POA: Diagnosis present

## 2019-02-12 DIAGNOSIS — F319 Bipolar disorder, unspecified: Secondary | ICD-10-CM | POA: Diagnosis present

## 2019-02-12 DIAGNOSIS — E1159 Type 2 diabetes mellitus with other circulatory complications: Secondary | ICD-10-CM | POA: Diagnosis present

## 2019-02-12 DIAGNOSIS — Y835 Amputation of limb(s) as the cause of abnormal reaction of the patient, or of later complication, without mention of misadventure at the time of the procedure: Secondary | ICD-10-CM | POA: Diagnosis present

## 2019-02-12 DIAGNOSIS — F410 Panic disorder [episodic paroxysmal anxiety] without agoraphobia: Secondary | ICD-10-CM | POA: Diagnosis present

## 2019-02-12 DIAGNOSIS — I152 Hypertension secondary to endocrine disorders: Secondary | ICD-10-CM | POA: Diagnosis present

## 2019-02-12 DIAGNOSIS — E1122 Type 2 diabetes mellitus with diabetic chronic kidney disease: Secondary | ICD-10-CM | POA: Diagnosis present

## 2019-02-12 DIAGNOSIS — K219 Gastro-esophageal reflux disease without esophagitis: Secondary | ICD-10-CM | POA: Diagnosis present

## 2019-02-12 DIAGNOSIS — Z8249 Family history of ischemic heart disease and other diseases of the circulatory system: Secondary | ICD-10-CM

## 2019-02-12 DIAGNOSIS — Z79899 Other long term (current) drug therapy: Secondary | ICD-10-CM

## 2019-02-12 DIAGNOSIS — A419 Sepsis, unspecified organism: Secondary | ICD-10-CM

## 2019-02-12 DIAGNOSIS — N179 Acute kidney failure, unspecified: Secondary | ICD-10-CM | POA: Diagnosis not present

## 2019-02-12 DIAGNOSIS — Z20828 Contact with and (suspected) exposure to other viral communicable diseases: Secondary | ICD-10-CM | POA: Diagnosis present

## 2019-02-12 DIAGNOSIS — T8789 Other complications of amputation stump: Secondary | ICD-10-CM | POA: Diagnosis present

## 2019-02-12 DIAGNOSIS — I5032 Chronic diastolic (congestive) heart failure: Secondary | ICD-10-CM | POA: Diagnosis present

## 2019-02-12 DIAGNOSIS — E119 Type 2 diabetes mellitus without complications: Secondary | ICD-10-CM

## 2019-02-12 DIAGNOSIS — Z89512 Acquired absence of left leg below knee: Secondary | ICD-10-CM

## 2019-02-12 DIAGNOSIS — I10 Essential (primary) hypertension: Secondary | ICD-10-CM | POA: Diagnosis present

## 2019-02-12 DIAGNOSIS — T8744 Infection of amputation stump, left lower extremity: Principal | ICD-10-CM | POA: Diagnosis present

## 2019-02-12 DIAGNOSIS — L0390 Cellulitis, unspecified: Secondary | ICD-10-CM | POA: Diagnosis present

## 2019-02-12 DIAGNOSIS — Z841 Family history of disorders of kidney and ureter: Secondary | ICD-10-CM

## 2019-02-12 DIAGNOSIS — D631 Anemia in chronic kidney disease: Secondary | ICD-10-CM | POA: Diagnosis present

## 2019-02-12 DIAGNOSIS — Z833 Family history of diabetes mellitus: Secondary | ICD-10-CM

## 2019-02-12 DIAGNOSIS — Z794 Long term (current) use of insulin: Secondary | ICD-10-CM

## 2019-02-12 DIAGNOSIS — Z206 Contact with and (suspected) exposure to human immunodeficiency virus [HIV]: Secondary | ICD-10-CM | POA: Diagnosis present

## 2019-02-12 DIAGNOSIS — Z6841 Body Mass Index (BMI) 40.0 and over, adult: Secondary | ICD-10-CM

## 2019-02-12 DIAGNOSIS — D638 Anemia in other chronic diseases classified elsewhere: Secondary | ICD-10-CM | POA: Diagnosis present

## 2019-02-12 DIAGNOSIS — M170 Bilateral primary osteoarthritis of knee: Secondary | ICD-10-CM

## 2019-02-12 DIAGNOSIS — M609 Myositis, unspecified: Secondary | ICD-10-CM | POA: Diagnosis present

## 2019-02-12 DIAGNOSIS — G546 Phantom limb syndrome with pain: Secondary | ICD-10-CM | POA: Diagnosis present

## 2019-02-12 DIAGNOSIS — E1169 Type 2 diabetes mellitus with other specified complication: Secondary | ICD-10-CM | POA: Diagnosis present

## 2019-02-12 DIAGNOSIS — E1151 Type 2 diabetes mellitus with diabetic peripheral angiopathy without gangrene: Secondary | ICD-10-CM

## 2019-02-12 DIAGNOSIS — R0602 Shortness of breath: Secondary | ICD-10-CM | POA: Diagnosis not present

## 2019-02-12 DIAGNOSIS — E039 Hypothyroidism, unspecified: Secondary | ICD-10-CM | POA: Diagnosis present

## 2019-02-12 DIAGNOSIS — N186 End stage renal disease: Secondary | ICD-10-CM | POA: Diagnosis present

## 2019-02-12 DIAGNOSIS — M797 Fibromyalgia: Secondary | ICD-10-CM | POA: Diagnosis present

## 2019-02-12 DIAGNOSIS — R05 Cough: Secondary | ICD-10-CM | POA: Diagnosis not present

## 2019-02-12 DIAGNOSIS — I5030 Unspecified diastolic (congestive) heart failure: Secondary | ICD-10-CM | POA: Diagnosis present

## 2019-02-12 DIAGNOSIS — F331 Major depressive disorder, recurrent, moderate: Secondary | ICD-10-CM

## 2019-02-12 LAB — CBC WITH DIFFERENTIAL/PLATELET
Abs Immature Granulocytes: 0.11 10*3/uL — ABNORMAL HIGH (ref 0.00–0.07)
Basophils Absolute: 0 10*3/uL (ref 0.0–0.1)
Basophils Relative: 0 %
Eosinophils Absolute: 0 10*3/uL (ref 0.0–0.5)
Eosinophils Relative: 0 %
HCT: 34 % — ABNORMAL LOW (ref 36.0–46.0)
Hemoglobin: 11 g/dL — ABNORMAL LOW (ref 12.0–15.0)
Immature Granulocytes: 1 %
Lymphocytes Relative: 6 %
Lymphs Abs: 0.9 10*3/uL (ref 0.7–4.0)
MCH: 27.3 pg (ref 26.0–34.0)
MCHC: 32.4 g/dL (ref 30.0–36.0)
MCV: 84.4 fL (ref 80.0–100.0)
Monocytes Absolute: 0.4 10*3/uL (ref 0.1–1.0)
Monocytes Relative: 2 %
Neutro Abs: 15.6 10*3/uL — ABNORMAL HIGH (ref 1.7–7.7)
Neutrophils Relative %: 91 %
Platelets: 300 10*3/uL (ref 150–400)
RBC: 4.03 MIL/uL (ref 3.87–5.11)
RDW: 13.2 % (ref 11.5–15.5)
WBC: 17.1 10*3/uL — ABNORMAL HIGH (ref 4.0–10.5)
nRBC: 0 % (ref 0.0–0.2)

## 2019-02-12 LAB — URINALYSIS, ROUTINE W REFLEX MICROSCOPIC
Bilirubin Urine: NEGATIVE
Glucose, UA: 150 mg/dL — AB
Ketones, ur: 5 mg/dL — AB
Leukocytes,Ua: NEGATIVE
Nitrite: NEGATIVE
Protein, ur: >=300 mg/dL — AB
Specific Gravity, Urine: 1.023 (ref 1.005–1.030)
pH: 6 (ref 5.0–8.0)

## 2019-02-12 LAB — COMPREHENSIVE METABOLIC PANEL
ALT: 11 U/L (ref 0–44)
AST: 9 U/L — ABNORMAL LOW (ref 15–41)
Albumin: 2.1 g/dL — ABNORMAL LOW (ref 3.5–5.0)
Alkaline Phosphatase: 60 U/L (ref 38–126)
Anion gap: 9 (ref 5–15)
BUN: 16 mg/dL (ref 6–20)
CO2: 22 mmol/L (ref 22–32)
Calcium: 8.2 mg/dL — ABNORMAL LOW (ref 8.9–10.3)
Chloride: 101 mmol/L (ref 98–111)
Creatinine, Ser: 1.55 mg/dL — ABNORMAL HIGH (ref 0.44–1.00)
GFR calc Af Amer: 47 mL/min — ABNORMAL LOW (ref >60)
GFR calc non Af Amer: 41 mL/min — ABNORMAL LOW (ref >60)
Glucose, Bld: 163 mg/dL — ABNORMAL HIGH (ref 70–99)
Potassium: 4.3 mmol/L (ref 3.5–5.1)
Sodium: 132 mmol/L — ABNORMAL LOW (ref 135–145)
Total Bilirubin: 0.6 mg/dL (ref 0.3–1.2)
Total Protein: 6.7 g/dL (ref 6.5–8.1)

## 2019-02-12 LAB — PROTIME-INR
INR: 1.1 (ref 0.8–1.2)
Prothrombin Time: 13.8 seconds (ref 11.4–15.2)

## 2019-02-12 LAB — LACTIC ACID, PLASMA: Lactic Acid, Venous: 1 mmol/L (ref 0.5–1.9)

## 2019-02-12 LAB — I-STAT BETA HCG BLOOD, ED (MC, WL, AP ONLY): I-stat hCG, quantitative: <5 m[IU]/mL (ref <5)

## 2019-02-12 NOTE — BH Specialist Note (Signed)
[facility] Visit via Telemedicine (Telephone)  02/12/2019 [name] [name] [id number]   Session Start time: 10:35  Session End time: 11:00 Total time: 25 minutes  Referring Provider: Dr. Tarri Abernethy Type of Visit: Telephonic Patient location: Home [facility] Provider location: Office All persons participating in visit: Patient and [facility]  Confirmed patient's address: Yes  Confirmed patient's phone number: Yes  Any changes to demographics: No   Discussed confidentiality: Yes    The following statements were read to the patient and/or legal guardian that are established with the [facility] Provider.  "The purpose of this phone visit is to provide behavioral health care while limiting exposure to the coronavirus (COVID19).  There is a possibility of technology failure and discussed alternative modes of communication if that failure occurs."  "By engaging in this telephone visit, you consent to the provision of healthcare.  Additionally, you authorize for your insurance to be billed for the services provided during this telephone visit."   Patient and/or legal guardian consented to telephone visit: Yes   PRESENTING CONCERNS: Patient and/or family reports the following symptoms/concerns: anxiety, grief, adjustment to loss of relationship, and health challenges. Duration of problem: increased over the past month; Severity of problem: mild  GOALS ADDRESSED: Patient will: 1.  Reduce symptoms of: anxiety, depression and co-dependency behaviors.  2.  Increase knowledge and/or ability of: coping skills, healthy habits and self-management skills  3.  Demonstrate ability to: Increase healthy adjustment to current life circumstances, Increase adequate support systems for patient/family and Begin healthy grieving over loss  INTERVENTIONS: Interventions utilized:  Motivational Interviewing, Brief CBT and Supportive Counseling Standardized Assessments completed: assessed for  SI, HI, and self-harm.  ASSESSMENT: Patient currently experiencing depression. Patient defined she just ended a relationship, and feels she is trying to move past the breakup. Patient is trying to implement healthy coping skills when she feels overwhelmed. Patient describes her emotions as "grieving the loss of her breakup". Patient has  A history of putting her partner's needs before her own (co-dependency). Patient did acknowledge this was unhealthy for her mental health, and wants to make improvement before entering into another relationship. Patient has a history of cutting and skin picking (last episode over one year ago). Patient reported she has no current urge to engage in these negative coping skills, but she does not want to relapse.   Patient may benefit from counseling.  PLAN: 1. Follow up with behavioral health clinician on : one week in person.   Dessie Coma, [facility], [place]

## 2019-02-12 NOTE — ED Triage Notes (Addendum)
Pt here with multiple complaints, states she has been having chills, cough, pain to her L stump, pain to her lower back, abdomen and behind her eyes. Also reports a "knot" to her L groin. States she took some penicillin a few days ago because her stump was red. Tachycardic and mildly tachypneic in triage.

## 2019-02-13 ENCOUNTER — Emergency Department (HOSPITAL_COMMUNITY): Payer: Medicare Other

## 2019-02-13 ENCOUNTER — Inpatient Hospital Stay (HOSPITAL_COMMUNITY): Payer: Medicare Other

## 2019-02-13 ENCOUNTER — Encounter: Payer: Self-pay | Admitting: *Deleted

## 2019-02-13 ENCOUNTER — Other Ambulatory Visit: Payer: Self-pay

## 2019-02-13 DIAGNOSIS — E0590 Thyrotoxicosis, unspecified without thyrotoxic crisis or storm: Secondary | ICD-10-CM

## 2019-02-13 DIAGNOSIS — E785 Hyperlipidemia, unspecified: Secondary | ICD-10-CM

## 2019-02-13 DIAGNOSIS — R590 Localized enlarged lymph nodes: Secondary | ICD-10-CM

## 2019-02-13 DIAGNOSIS — T8789 Other complications of amputation stump: Secondary | ICD-10-CM | POA: Diagnosis not present

## 2019-02-13 DIAGNOSIS — D649 Anemia, unspecified: Secondary | ICD-10-CM | POA: Diagnosis not present

## 2019-02-13 DIAGNOSIS — A419 Sepsis, unspecified organism: Secondary | ICD-10-CM

## 2019-02-13 DIAGNOSIS — Z6841 Body Mass Index (BMI) 40.0 and over, adult: Secondary | ICD-10-CM | POA: Diagnosis not present

## 2019-02-13 DIAGNOSIS — M609 Myositis, unspecified: Secondary | ICD-10-CM | POA: Diagnosis present

## 2019-02-13 DIAGNOSIS — Z79899 Other long term (current) drug therapy: Secondary | ICD-10-CM | POA: Diagnosis not present

## 2019-02-13 DIAGNOSIS — N183 Chronic kidney disease, stage 3 (moderate): Secondary | ICD-10-CM

## 2019-02-13 DIAGNOSIS — R05 Cough: Secondary | ICD-10-CM | POA: Diagnosis not present

## 2019-02-13 DIAGNOSIS — T8744 Infection of amputation stump, left lower extremity: Secondary | ICD-10-CM | POA: Diagnosis present

## 2019-02-13 DIAGNOSIS — K219 Gastro-esophageal reflux disease without esophagitis: Secondary | ICD-10-CM | POA: Diagnosis present

## 2019-02-13 DIAGNOSIS — Z794 Long term (current) use of insulin: Secondary | ICD-10-CM

## 2019-02-13 DIAGNOSIS — Z89512 Acquired absence of left leg below knee: Secondary | ICD-10-CM | POA: Diagnosis not present

## 2019-02-13 DIAGNOSIS — F419 Anxiety disorder, unspecified: Secondary | ICD-10-CM | POA: Diagnosis not present

## 2019-02-13 DIAGNOSIS — D631 Anemia in chronic kidney disease: Secondary | ICD-10-CM | POA: Diagnosis present

## 2019-02-13 DIAGNOSIS — I130 Hypertensive heart and chronic kidney disease with heart failure and stage 1 through stage 4 chronic kidney disease, or unspecified chronic kidney disease: Secondary | ICD-10-CM | POA: Diagnosis present

## 2019-02-13 DIAGNOSIS — G546 Phantom limb syndrome with pain: Secondary | ICD-10-CM | POA: Diagnosis present

## 2019-02-13 DIAGNOSIS — E1122 Type 2 diabetes mellitus with diabetic chronic kidney disease: Secondary | ICD-10-CM | POA: Diagnosis present

## 2019-02-13 DIAGNOSIS — R0602 Shortness of breath: Secondary | ICD-10-CM | POA: Diagnosis not present

## 2019-02-13 DIAGNOSIS — Z20828 Contact with and (suspected) exposure to other viral communicable diseases: Secondary | ICD-10-CM | POA: Diagnosis present

## 2019-02-13 DIAGNOSIS — F17210 Nicotine dependence, cigarettes, uncomplicated: Secondary | ICD-10-CM | POA: Diagnosis present

## 2019-02-13 DIAGNOSIS — L03116 Cellulitis of left lower limb: Secondary | ICD-10-CM

## 2019-02-13 DIAGNOSIS — Y835 Amputation of limb(s) as the cause of abnormal reaction of the patient, or of later complication, without mention of misadventure at the time of the procedure: Secondary | ICD-10-CM | POA: Diagnosis present

## 2019-02-13 DIAGNOSIS — Z202 Contact with and (suspected) exposure to infections with a predominantly sexual mode of transmission: Secondary | ICD-10-CM | POA: Diagnosis not present

## 2019-02-13 DIAGNOSIS — Z881 Allergy status to other antibiotic agents status: Secondary | ICD-10-CM

## 2019-02-13 DIAGNOSIS — I5030 Unspecified diastolic (congestive) heart failure: Secondary | ICD-10-CM

## 2019-02-13 DIAGNOSIS — M797 Fibromyalgia: Secondary | ICD-10-CM | POA: Diagnosis present

## 2019-02-13 DIAGNOSIS — N179 Acute kidney failure, unspecified: Secondary | ICD-10-CM | POA: Diagnosis present

## 2019-02-13 DIAGNOSIS — E039 Hypothyroidism, unspecified: Secondary | ICD-10-CM | POA: Diagnosis present

## 2019-02-13 DIAGNOSIS — I5032 Chronic diastolic (congestive) heart failure: Secondary | ICD-10-CM | POA: Diagnosis present

## 2019-02-13 DIAGNOSIS — G8929 Other chronic pain: Secondary | ICD-10-CM | POA: Diagnosis present

## 2019-02-13 DIAGNOSIS — L0390 Cellulitis, unspecified: Secondary | ICD-10-CM | POA: Diagnosis present

## 2019-02-13 DIAGNOSIS — Z206 Contact with and (suspected) exposure to human immunodeficiency virus [HIV]: Secondary | ICD-10-CM | POA: Diagnosis present

## 2019-02-13 LAB — CBC
HCT: 30.4 % — ABNORMAL LOW (ref 36.0–46.0)
Hemoglobin: 9.6 g/dL — ABNORMAL LOW (ref 12.0–15.0)
MCH: 27.4 pg (ref 26.0–34.0)
MCHC: 31.6 g/dL (ref 30.0–36.0)
MCV: 86.6 fL (ref 80.0–100.0)
Platelets: 261 10*3/uL (ref 150–400)
RBC: 3.51 MIL/uL — ABNORMAL LOW (ref 3.87–5.11)
RDW: 13.3 % (ref 11.5–15.5)
WBC: 11.7 10*3/uL — ABNORMAL HIGH (ref 4.0–10.5)
nRBC: 0 % (ref 0.0–0.2)

## 2019-02-13 LAB — BASIC METABOLIC PANEL
Anion gap: 4 — ABNORMAL LOW (ref 5–15)
BUN: 19 mg/dL (ref 6–20)
CO2: 22 mmol/L (ref 22–32)
Calcium: 7.7 mg/dL — ABNORMAL LOW (ref 8.9–10.3)
Chloride: 107 mmol/L (ref 98–111)
Creatinine, Ser: 1.79 mg/dL — ABNORMAL HIGH (ref 0.44–1.00)
GFR calc Af Amer: 40 mL/min — ABNORMAL LOW (ref >60)
GFR calc non Af Amer: 34 mL/min — ABNORMAL LOW (ref >60)
Glucose, Bld: 147 mg/dL — ABNORMAL HIGH (ref 70–99)
Potassium: 4 mmol/L (ref 3.5–5.1)
Sodium: 133 mmol/L — ABNORMAL LOW (ref 135–145)

## 2019-02-13 LAB — SARS CORONAVIRUS 2 BY RT PCR (HOSPITAL ORDER, PERFORMED IN ~~LOC~~ HOSPITAL LAB): SARS Coronavirus 2: NEGATIVE

## 2019-02-13 LAB — HIV ANTIBODY (ROUTINE TESTING W REFLEX): HIV Screen 4th Generation wRfx: NONREACTIVE

## 2019-02-13 LAB — GLUCOSE, CAPILLARY: Glucose-Capillary: 158 mg/dL — ABNORMAL HIGH (ref 70–99)

## 2019-02-13 LAB — CBG MONITORING, ED: Glucose-Capillary: 103 mg/dL — ABNORMAL HIGH (ref 70–99)

## 2019-02-13 MED ORDER — HYDROXYZINE HCL 25 MG PO TABS: 25.0000 mg | ORAL_TABLET | Freq: Four times a day (QID) | ORAL | Status: DC | PRN

## 2019-02-13 MED ORDER — ACETAMINOPHEN 500 MG PO TABS
1000.0000 mg | ORAL_TABLET | Freq: Once | ORAL | Status: AC
  Administered 2019-02-13: 02:00:00 1000 mg via ORAL
  Filled 2019-02-13: qty 2

## 2019-02-13 MED ORDER — ENOXAPARIN SODIUM 60 MG/0.6ML ~~LOC~~ SOLN
60.0000 mg | SUBCUTANEOUS | Status: DC
  Administered 2019-02-14: 15:00:00 60 mg via SUBCUTANEOUS
  Filled 2019-02-13 (×2): qty 0.6

## 2019-02-13 MED ORDER — OXYCODONE-ACETAMINOPHEN 5-325 MG PO TABS
1.0000 | ORAL_TABLET | Freq: Three times a day (TID) | ORAL | Status: DC | PRN
  Administered 2019-02-13: 2 via ORAL
  Administered 2019-02-13: 1 via ORAL
  Administered 2019-02-14 – 2019-02-15 (×3): 2 via ORAL
  Filled 2019-02-13 (×4): qty 2
  Filled 2019-02-13: qty 1

## 2019-02-13 MED ORDER — ATORVASTATIN CALCIUM 10 MG PO TABS
20.0000 mg | ORAL_TABLET | Freq: Every day | ORAL | Status: DC
  Administered 2019-02-13 – 2019-02-15 (×3): 20 mg via ORAL
  Filled 2019-02-13 (×3): qty 2

## 2019-02-13 MED ORDER — ACETAMINOPHEN 650 MG RE SUPP: 650.0000 mg | Freq: Four times a day (QID) | RECTAL | Status: DC | PRN

## 2019-02-13 MED ORDER — ONDANSETRON HCL 4 MG PO TABS: 4.0000 mg | ORAL_TABLET | Freq: Four times a day (QID) | ORAL | Status: DC | PRN

## 2019-02-13 MED ORDER — INSULIN ASPART 100 UNIT/ML ~~LOC~~ SOLN: 0.0000 [IU] | Freq: Every day | SUBCUTANEOUS | Status: DC

## 2019-02-13 MED ORDER — METOPROLOL TARTRATE 12.5 MG HALF TABLET
12.5000 mg | ORAL_TABLET | Freq: Two times a day (BID) | ORAL | Status: DC
  Administered 2019-02-13 – 2019-02-15 (×4): 12.5 mg via ORAL
  Filled 2019-02-13 (×7): qty 1

## 2019-02-13 MED ORDER — AMITRIPTYLINE HCL 50 MG PO TABS
50.0000 mg | ORAL_TABLET | Freq: Every day | ORAL | Status: DC
  Administered 2019-02-13 – 2019-02-14 (×2): 50 mg via ORAL
  Filled 2019-02-13: qty 1
  Filled 2019-02-13: qty 2
  Filled 2019-02-13: qty 1
  Filled 2019-02-13: qty 2
  Filled 2019-02-13: qty 1

## 2019-02-13 MED ORDER — SODIUM CHLORIDE 0.9 % IV BOLUS (SEPSIS)
250.0000 mL | Freq: Once | INTRAVENOUS | Status: AC
  Administered 2019-02-13: 250 mL via INTRAVENOUS

## 2019-02-13 MED ORDER — ONDANSETRON HCL 4 MG/2ML IJ SOLN: 4.0000 mg | Freq: Four times a day (QID) | INTRAMUSCULAR | Status: DC | PRN

## 2019-02-13 MED ORDER — CLINDAMYCIN PHOSPHATE 600 MG/50ML IV SOLN
600.0000 mg | Freq: Three times a day (TID) | INTRAVENOUS | Status: DC
  Administered 2019-02-13 – 2019-02-14 (×4): 600 mg via INTRAVENOUS
  Filled 2019-02-13 (×4): qty 50

## 2019-02-13 MED ORDER — INSULIN ASPART 100 UNIT/ML ~~LOC~~ SOLN
0.0000 [IU] | Freq: Three times a day (TID) | SUBCUTANEOUS | Status: DC
  Administered 2019-02-13 – 2019-02-14 (×3): 2 [IU] via SUBCUTANEOUS
  Administered 2019-02-15: 3 [IU] via SUBCUTANEOUS

## 2019-02-13 MED ORDER — INSULIN GLARGINE 100 UNIT/ML ~~LOC~~ SOLN
10.0000 [IU] | Freq: Every day | SUBCUTANEOUS | Status: DC
  Administered 2019-02-13 – 2019-02-14 (×2): 10 [IU] via SUBCUTANEOUS
  Filled 2019-02-13 (×3): qty 0.1

## 2019-02-13 MED ORDER — GADOBUTROL 1 MMOL/ML IV SOLN
10.0000 mL | Freq: Once | INTRAVENOUS | Status: AC | PRN
  Administered 2019-02-13: 10 mL via INTRAVENOUS

## 2019-02-13 MED ORDER — FENTANYL CITRATE (PF) 100 MCG/2ML IJ SOLN
50.0000 ug | Freq: Once | INTRAMUSCULAR | Status: AC
  Administered 2019-02-13: 50 ug via INTRAVENOUS
  Filled 2019-02-13: qty 2

## 2019-02-13 MED ORDER — SODIUM CHLORIDE 0.9 % IV BOLUS (SEPSIS)
1000.0000 mL | Freq: Once | INTRAVENOUS | Status: AC
  Administered 2019-02-13: 01:00:00 1000 mL via INTRAVENOUS

## 2019-02-13 MED ORDER — ACETAMINOPHEN 325 MG PO TABS
650.0000 mg | ORAL_TABLET | Freq: Four times a day (QID) | ORAL | Status: DC | PRN
  Filled 2019-02-13: qty 2

## 2019-02-13 MED ORDER — ONDANSETRON HCL 4 MG/2ML IJ SOLN
4.0000 mg | Freq: Once | INTRAMUSCULAR | Status: AC
  Administered 2019-02-13: 4 mg via INTRAVENOUS
  Filled 2019-02-13: qty 2

## 2019-02-13 MED ORDER — LACTATED RINGERS IV BOLUS
1000.0000 mL | Freq: Once | INTRAVENOUS | Status: AC
  Administered 2019-02-13: 05:00:00 1000 mL via INTRAVENOUS

## 2019-02-13 MED ORDER — PREGABALIN 50 MG PO CAPS
50.0000 mg | ORAL_CAPSULE | Freq: Two times a day (BID) | ORAL | Status: DC
  Administered 2019-02-13 – 2019-02-15 (×5): 50 mg via ORAL
  Filled 2019-02-13: qty 2
  Filled 2019-02-13 (×4): qty 1

## 2019-02-13 MED ORDER — CLINDAMYCIN PHOSPHATE 600 MG/50ML IV SOLN
600.0000 mg | Freq: Once | INTRAVENOUS | Status: AC
  Administered 2019-02-13: 600 mg via INTRAVENOUS
  Filled 2019-02-13: qty 50

## 2019-02-13 MED ORDER — FLUOXETINE HCL 20 MG PO CAPS
60.0000 mg | ORAL_CAPSULE | Freq: Every day | ORAL | Status: DC
  Administered 2019-02-13 – 2019-02-15 (×3): 60 mg via ORAL
  Filled 2019-02-13 (×4): qty 3

## 2019-02-13 MED ORDER — PROMETHAZINE HCL 25 MG PO TABS
12.5000 mg | ORAL_TABLET | Freq: Once | ORAL | Status: AC
  Administered 2019-02-13: 12.5 mg via ORAL
  Filled 2019-02-13: qty 1

## 2019-02-13 MED ORDER — SODIUM CHLORIDE 0.9 % IV BOLUS (SEPSIS)
1000.0000 mL | Freq: Once | INTRAVENOUS | Status: AC
  Administered 2019-02-13: 1000 mL via INTRAVENOUS

## 2019-02-13 NOTE — ED Notes (Signed)
Ordered diet tray for pt  

## 2019-02-13 NOTE — Progress Notes (Signed)

## 2019-02-13 NOTE — Progress Notes (Signed)
Paged to patient's room regarding increased pain around her stump lower back and behind her eyes.  Patient states that she normally has phantom limb pain that is managed with Percocet at home but is no longer successfully treating her pain at this time.  She admits to headache with pain behind her eyes with associated nausea and dry heaves.  Pain in her head is 8 out of 10, back is 7 out of 10, and stump is 7 out of 10. I counseled the patient on taking her Percocet and see if that manages her pain.  If she continues to have pain I told the nurse to page me so that we can adjust her medications.  I will give her a single dose of Phenergan at this time to see if that helps her nausea.  Patient understands the plan and verbally agreed at this time.    Marianna Payment, D.O. Date 9/23/20200 time 22:22h PGY-1, Internal Medicine Residency

## 2019-02-13 NOTE — ED Notes (Signed)
Patient CBG is 136.

## 2019-02-13 NOTE — ED Notes (Signed)
Patient transported to MRI again for more imaging.

## 2019-02-13 NOTE — ED Notes (Addendum)
Patient transported to MRI 

## 2019-02-13 NOTE — H&P (Signed)
Date: 02/13/2019               Patient Name:  [name] [name] MRN: [id number]  DOB: [birth year] Age / Sex: 43 y.o., female   PCP: Ina Homes, MD         Medical Service: Internal Medicine Teaching Service         Attending Physician: Dr. Leonides Schanz, Delice Bison, DO    First Contact: Dr. Court Joy Pager:  [phone]  Second Contact: Dr. Sherry Ruffing Pager:  [phone]       After Hours (After 5p/  First Contact Pager: [phone]  weekends / holidays): Second Contact Pager: [phone]   Chief Complaint: Cellulitis  History of Present Illness:  [name] [name] is a 43 year old female with a past medical history of diabetes mellitus, hyperthyroidism, hyperlipidemia, CHF, hypertension, chronic kidney disease stage III, and status post knee amputation.  She was in her usual state of health until a week and a half ago and presented to the ED with cellulitis of her BKA stump. History was gathered from the patient and her medical records.   Her symptoms began with pain, redness, and warmth surrounding her left BKA stump.  She had a leftover penicillin at home which she took for 5 to 6 days, which mildly improved her symptoms but did not resolve them.  She has felt a "knot" in her left inguinal area during this time.  The patient denies any leg injury or open wounds.  Patient has associated fever, dry cough, nausea, vomiting, and lower back pain.  She denies chest pain, shortness of breath, abdominal pain, diarrhea, dysuria, hematuria, hematemesis, urethral discharge.  Patient history is significant for cat at home and unsure about recent scratches or bites. Has a previous history of cellulitis of her stump with GBS bacteremia in 2019 that was successfully treated with ceftriaxone.   Social:  Social History   Socioeconomic History  . Marital status: Single    Spouse name: Not on file  . Number of children: 0  . Years of education: 15  . Highest education level: Not on file  Occupational History  .  Occupation: disability  Social Needs  . Financial resource strain: Not on file  . Food insecurity    Worry: Not on file    Inability: Not on file  . Transportation needs    Medical: Not on file    Non-medical: Not on file  Tobacco Use  . Smoking status: Current Every Day Smoker    Packs/day: 0.50    Years: 16.00    Pack years: 8.00    Types: Cigarettes  . Smokeless tobacco: Never Used  Substance and Sexual Activity  . Alcohol use: Yes    Alcohol/week: 0.0 standard drinks    Comment: Socially-" monthy  maybe"  . Drug use: No  . Sexual activity: Yes  Lifestyle  . Physical activity    Days per week: Not on file    Minutes per session: Not on file  . Stress: Not on file  Relationships  . Social Herbalist on phone: Not on file    Gets together: Not on file    Attends religious service: Not on file    Active member of club or organization: Not on file    Attends meetings of clubs or organizations: Not on file    Relationship status: Not on file  . Intimate partner violence    Fear of current or ex partner: Not on file  Emotionally abused: Not on file    Physically abused: Not on file    Forced sexual activity: Not on file  Other Topics Concern  . Not on file  Social History Narrative   Lives with mother currently, goddaughter and uncle in a one story home.     On disability for diabetes, neuropathy, sleep apnea etc.  Disability since 2014.    Education: 11th grade.      Family History: .fami  Meds:  Current Meds  Medication Sig  . acetaminophen (TYLENOL) 325 MG tablet Take 1-2 tablets (325-650 mg total) by mouth every 4 (four) hours as needed for mild pain.  Marland Kitchen amitriptyline (ELAVIL) 50 MG tablet Take 50 mg by mouth at bedtime.  Marland Kitchen amLODipine (NORVASC) 10 MG tablet Take 1 tablet (10 mg total) by mouth daily.  Marland Kitchen atorvastatin (LIPITOR) 20 MG tablet Take 1 tablet (20 mg total) by mouth daily.  . bumetanide (BUMEX) 1 MG tablet Take 1 tablet (1 mg total) by  mouth 2 (two) times daily.  Marland Kitchen FLUoxetine (PROZAC) 20 MG/5ML solution Take 15 mLs (60 mg total) by mouth daily.  . hydrOXYzine (ATARAX/VISTARIL) 25 MG tablet Take 1 tablet (25 mg total) by mouth every 6 (six) hours as needed for anxiety.  . insulin aspart (NOVOLOG) 100 UNIT/ML FlexPen Inject 14 Units into the skin 3 (three) times daily with meals. (Patient taking differently: Inject 10 Units into the skin 3 (three) times daily with meals. )  . liraglutide (VICTOZA) 18 MG/3ML SOPN Inject 0.42mls (1.8mg  total) into the skin daily  . lisinopril (PRINIVIL,ZESTRIL) 10 MG tablet Take 1 tablet (10 mg total) by mouth daily.  . metoprolol tartrate (LOPRESSOR) 25 MG tablet Take 0.5 tablets (12.5 mg total) by mouth 2 (two) times daily.  Marland Kitchen oxyCODONE-acetaminophen (PERCOCET/ROXICET) 5-325 MG tablet Take 1-2 tablets by mouth daily as needed for moderate pain or severe pain. (Patient taking differently: Take 1-2 tablets by mouth every 8 (eight) hours as needed for moderate pain. )  . polyethylene glycol (MIRALAX / GLYCOLAX) packet Take 17 g by mouth daily. (Patient taking differently: Take 17 g by mouth daily as needed for moderate constipation. )  . pregabalin (LYRICA) 50 MG capsule Take 1 capsule (50 mg total) by mouth 2 (two) times daily.     Allergies: Allergies as of 02/12/2019 - Review Complete 02/12/2019  Allergen Reaction Noted  . Vancomycin Other (See Comments) 08/24/2012   Past Medical History:  Diagnosis Date  . Abdominal muscle pain 09/08/2016  . Abnormal Pap smear of cervix 2009  . Abscess    history of multiple abscesses  . Acute bilateral low back pain 02/13/2017  . Acute blood loss anemia   . Acute renal failure ([place]) 07/12/2012  . AKI (acute kidney injury) ([place])   . Anemia of chronic disease 2002  . Anxiety    Panic attacks  . Bilateral lower extremity edema 05/13/2016  . Bipolar disorder ([place])   . Cellulitis 05/21/2014   right eye  . Chronic bronchitis ([place])    "get it q yr"  (05/13/2013)  . Chronic pain   . Depression   . Edema of lower extremity   . Endocarditis 2002   subacute bacterial endocarditis.   . Family history of anesthesia complication    "my mom has a hard time coming out from under"  . Fibromyalgia   . GERD (gastroesophageal reflux disease)    occ  . Heart murmur   . History of blood transfusion    "  just low blood count" (05/13/2013)  . Hyperlipidemia   . Hypertension   . Hypothyroidism   . Hypothyroidism, adult 03/21/2014  . Leukocytosis   . Necrosis ([place])    and ulceration  . Necrotizing fasciitis s/p OR debridements 07/06/2012  . Obesity   . OSA on CPAP    does not wear CPAP  . Peripheral neuropathy   . Type II diabetes mellitus (HCC)    Type  II     Review of Systems: A complete ROS was negative except as per HPI.   Physical Exam: Blood pressure 96/64, pulse (!) 106, temperature (!) 102.1 F (38.9 C), temperature source Rectal, resp. rate (!) 27, SpO2 93 %.  Physical Exam  Constitutional: She is oriented to person, place, and time. No distress.  HENT:  Head: Normocephalic and atraumatic.  Eyes: EOM are normal.  Neck: Normal range of motion.  Cardiovascular: Regular rhythm, normal heart sounds and intact distal pulses. Exam reveals no gallop and no friction rub.  No murmur heard. Pulmonary/Chest: Effort normal and breath sounds normal. No respiratory distress. She exhibits no tenderness.  Abdominal: Soft. She exhibits no distension. There is no abdominal tenderness.  Musculoskeletal: Normal range of motion.  Lymphadenopathy:       Left: Inguinal adenopathy present.  Neurological: She is alert and oriented to person, place, and time.  Skin: Skin is warm and dry. No rash noted. No erythema.     EKG: personally reviewed my interpretation is Borderline cardiomegaly.  No acute pulmonary process.  CXR: personally reviewed my interpretation is Sinus tachycardia. Borderline T abnormalities, lateral leads. No significant  change since last tracing.  Assessment & Plan by Problem: Active Problems:   Controlled type 2 diabetes mellitus (HCC)   Chronic kidney disease (CKD), stage III (moderate) (HCC)   Status post below knee amputation, left (HCC)   Anemia of chronic disease   Cellulitis  [name] [name] is a 43 year old female with a past medical history of diabetes mellitus, , hyperlipidemia, CHF, hypertension, chronic kidney disease stage III, and status post knee amputation.  She was in her usual state of health until a week and a half ago and presented to the ED with fever, tachycardia, tachypnea, and an elevated white blood cell count with a history concerning for sepsis due to cellulitis of her left BKA stump.    Sepsis due to cellulitis of left BKA stump - In the ED she was given given clindamycin 600 mg at 100 mL/h and 2 L of normal saline and fentanyl 50 mcg IV for pain.  - 2x blood cultures were drawn. - Continue clindamycin IV  Diabetes mellitus: - NovoLog (moderate) 3 times daily with meals  - NovoLog 0 to 5 units sliding scale nightly - Lantus 10 units nightly  Anxiety: - Fluoxetine, amitriptyline and hydroxyzine continued.  Diet: Carb modified VTE: Lovenox IVF: Lactated ringers Code: Full  Dispo: Admit patient to Observation with expected length of stay less than 2 midnights.  Signed: Marianna Payment, MD 02/13/2019, 4:51 AM  Pager: [phone]

## 2019-02-13 NOTE — ED Provider Notes (Signed)
TIME SEEN: 12:14 AM  CHIEF COMPLAINT: Fever, pain in left stump, cough, vomiting  HPI: Patient is a 43 year old female with history of previous left BKA, obesity, diabetes, hypertension, hyperlipidemia, chronic kidney disease who presents to the emergency department with 5 to 6 days of pain in the left stump with redness, warmth.  States she had leftover penicillin at home that she started about 5 to 6 days ago.  Continues to have pain in this leg although redness and warmth has improved.  Has felt a "knot" in her left inguinal area.  No injury to this leg.  No open wounds or drainage.  Began having fever, dry cough, nausea and vomiting, lower back pain.  No diarrhea.  No abdominal pain.  No chest pain.  No COVID exposures.  Echo 01/25/2017:  Study Conclusions  - Left ventricle: The cavity size was normal. Wall thickness was   increased in a pattern of mild LVH. Systolic function was normal.   The estimated ejection fraction was in the range of 60% to 65%.   Although no diagnostic regional wall motion abnormality was   identified, this possibility cannot be completely excluded on the   basis of this study. Features are consistent with a pseudonormal   left ventricular filling pattern, with concomitant abnormal   relaxation and increased filling pressure (grade 2 diastolic   dysfunction). - Aortic valve: There was no stenosis. - Mitral valve: There was no regurgitation. - Right ventricle: The cavity size was normal. Systolic function   was normal. - Pulmonary arteries: No complete TR doppler jet so unable to   estimate PA systolic pressure. - Inferior vena cava: The vessel was normal in size. The   respirophasic diameter changes were in the normal range (>= 50%),   consistent with normal central venous pressure.  Impressions:  - Normal LV size with mild LV hypertrophy. EF 60-65%. Moderate   diastolic dysfunction. Normal RV size and systolic function. No   significant valvular  abnormalities.  ROS: See HPI Constitutional: no fever  Eyes: no drainage  ENT: no runny nose   Cardiovascular:  no chest pain  Resp: no SOB  GI: no vomiting GU: no dysuria Integumentary: no rash  Allergy: no hives  Musculoskeletal: no leg swelling  Neurological: no slurred speech ROS otherwise negative  PAST MEDICAL HISTORY/PAST SURGICAL HISTORY:  Past Medical History:  Diagnosis Date  . Abdominal muscle pain 09/08/2016  . Abnormal Pap smear of cervix 2009  . Abscess    history of multiple abscesses  . Acute bilateral low back pain 02/13/2017  . Acute blood loss anemia   . Acute renal failure ([place]) 07/12/2012  . AKI (acute kidney injury) ([place])   . Anemia of chronic disease 2002  . Anxiety    Panic attacks  . Bilateral lower extremity edema 05/13/2016  . Bipolar disorder ([place])   . Cellulitis 05/21/2014   right eye  . Chronic bronchitis ([place])    "get it q yr" (05/13/2013)  . Chronic pain   . Depression   . Edema of lower extremity   . Endocarditis 2002   subacute bacterial endocarditis.   . Family history of anesthesia complication    "my mom has a hard time coming out from under"  . Fibromyalgia   . GERD (gastroesophageal reflux disease)    occ  . Heart murmur   . History of blood transfusion    "just low blood count" (05/13/2013)  . Hyperlipidemia   . Hypertension   .  Hypothyroidism   . Hypothyroidism, adult 03/21/2014  . Leukocytosis   . Necrosis ([place])    and ulceration  . Necrotizing fasciitis s/p OR debridements 07/06/2012  . Obesity   . OSA on CPAP    does not wear CPAP  . Peripheral neuropathy   . Type II diabetes mellitus (HCC)    Type  II    MEDICATIONS:  Prior to Admission medications   Medication Sig Start Date End Date Taking? Authorizing Provider  acetaminophen (TYLENOL) 325 MG tablet Take 1-2 tablets (325-650 mg total) by mouth every 4 (four) hours as needed for mild pain. 05/01/18   Love, Ivan Anchors, PA-C  aluminum chloride (DRYSOL) 20 %  external solution Apply topically at bedtime. 10/23/18   Suzan Slick, NP  amLODipine (NORVASC) 10 MG tablet Take 1 tablet (10 mg total) by mouth daily. 06/29/18   Ina Homes, MD  atorvastatin (LIPITOR) 20 MG tablet Take 1 tablet (20 mg total) by mouth daily. 06/29/18   Ina Homes, MD  blood glucose meter kit and supplies KIT ICD 10- E11.69. Based on patient and insurance preference. Use up to four times daily as directed. 05/01/18   Love, Ivan Anchors, PA-C  bumetanide (BUMEX) 1 MG tablet Take 1 tablet (1 mg total) by mouth 2 (two) times daily. 12/04/18 12/04/19  Masoudi, Dorthula Rue, MD  dextromethorphan-guaiFENesin (MUCINEX DM) 30-600 MG 12hr tablet Take 1 tablet by mouth 2 (two) times daily as needed for cough. 05/01/18   Love, Ivan Anchors, PA-C  diclofenac sodium (VOLTAREN) 1 % GEL Apply 4 grams to affected area 4 times daily as needed for pain 08/23/18   Ina Homes, MD  FLUoxetine (PROZAC) 20 MG/5ML solution Take 15 mLs (60 mg total) by mouth daily. 06/29/18 06/24/19  Ina Homes, MD  hydrOXYzine (ATARAX/VISTARIL) 25 MG tablet Take 1 tablet (25 mg total) by mouth every 6 (six) hours as needed for anxiety. 01/10/19   Lamptey, Myrene Galas, MD  insulin aspart (NOVOLOG) 100 UNIT/ML FlexPen Inject 14 Units into the skin 3 (three) times daily with meals. 11/08/18   Ina Homes, MD  iron polysaccharides (NIFEREX) 150 MG capsule Take 1 capsule (150 mg total) by mouth 2 (two) times daily before lunch and supper. 06/29/18   Helberg, Larkin Ina, MD  lidocaine (LIDODERM) 5 % Place 1 patch onto the skin daily. To left knee at 7 am and remove at 7 pm 05/01/18   Love, Ivan Anchors, PA-C  liraglutide (VICTOZA) 18 MG/3ML SOPN Inject 0.78ms (1.813mtotal) into the skin daily 10/24/18   HeIna HomesMD  lisinopril (PRINIVIL,ZESTRIL) 10 MG tablet Take 1 tablet (10 mg total) by mouth daily. 06/29/18   HeIna HomesMD  Menthol-Methyl Salicylate (MUSCLE RUB) 10-15 % CREA Apply 1 application topically 4 (four) times daily as  needed for muscle pain. 05/01/18   Love, PaIvan AnchorsPA-C  metoprolol tartrate (LOPRESSOR) 25 MG tablet Take 0.5 tablets (12.5 mg total) by mouth 2 (two) times daily. 06/29/18   HeIna HomesMD  mupirocin ointment (BACTROBAN) 2 % Apply 1 application to affected area 2 times daily 12/22/18   HeIna HomesMD  NYSTATIN powder Apply topically 4 times daily. After cleaning and drying the affected area for two weeks 09/18/18   NaAldine ContesMD  oxyCODONE-acetaminophen (PERCOCET/ROXICET) 5-325 MG tablet Take 1-2 tablets by mouth daily as needed for moderate pain or severe pain. 01/24/19   Helberg, JuLarkin InaMD  polyethylene glycol (MIRALAX / GLYCOLAX) packet Take 17 g by mouth daily. Patient taking differently:  Take 17 g by mouth daily as needed for moderate constipation.  04/25/17   Angiulli, Lavon Paganini, PA-C  pregabalin (LYRICA) 50 MG capsule Take 1 capsule (50 mg total) by mouth 2 (two) times daily. 11/08/18   Helberg, Larkin Ina, MD  senna-docusate (SENOKOT-S) 8.6-50 MG tablet Take 2 tablets by mouth 2 (two) times daily. 05/01/18   Love, Ivan Anchors, PA-C  simethicone (MYLICON) 40 ID/7.8EU drops Take 1.2 mLs (80 mg total) by mouth 4 (four) times daily. 05/01/18   Bary Leriche, PA-C    ALLERGIES:  Allergies  Allergen Reactions  . Vancomycin Other (See Comments)    Acute renal failure suspected secondary to vanco    SOCIAL HISTORY:  Social History   Tobacco Use  . Smoking status: Current Every Day Smoker    Packs/day: 0.50    Years: 16.00    Pack years: 8.00    Types: Cigarettes  . Smokeless tobacco: Never Used  Substance Use Topics  . Alcohol use: Yes    Alcohol/week: 0.0 standard drinks    Comment: Socially-" monthy  maybe"    FAMILY HISTORY: Family History  Problem Relation Age of Onset  . Heart failure Mother   . Diabetes Mother   . Kidney disease Mother   . Kidney disease Father   . Diabetes Father   . Diabetes Paternal Grandmother   . Heart failure Paternal Grandmother   .  Other Other     EXAM: BP 136/84 (BP Location: Left Arm)   Pulse (!) 124   Temp 99.9 F (37.7 C) (Oral)   Resp (!) 22   SpO2 99%  CONSTITUTIONAL: Alert and oriented and responds appropriately to questions.  Obese, appears uncomfortable HEAD: Normocephalic EYES: Conjunctivae clear, pupils appear equal, EOMI ENT: normal nose; moist mucous membranes NECK: Supple, no meningismus, no nuchal rigidity, no LAD  CARD: Regular and tachycardic; S1 and S2 appreciated; no murmurs, no clicks, no rubs, no gallops RESP: Normal chest excursion without splinting, slightly tachypneic; breath sounds clear and equal bilaterally; no wheezes, no rhonchi, no rales, no hypoxia or respiratory distress, speaking full sentences ABD/GI: Normal bowel sounds; non-distended; soft, non-tender, no rebound, no guarding, no peritoneal signs, no hepatosplenomegaly BACK:  The back appears normal and is non-tender to palpation, there is no CVA tenderness EXT: Patient status post left BKA.  She has pain throughout this left lower extremity with no open wounds.  The leg is warm to touch compared to the right side and there is some redness over the anterior left thigh.  No edema noted.  No crepitus.  She has inguinal lymphadenopathy on the left side. SKIN: Normal color for age and race; warm; no rash NEURO: Moves all extremities equally PSYCH: The patient's mood and manner are appropriate. Grooming and personal hygiene are appropriate.  MEDICAL DECISION MAKING: Patient here with fevers, cough, vomiting, body aches.  Also complaining of pain in the left stump with redness, warmth that did improve when she started penicillin at home 6 days ago.  She has inguinal lymphadenopathy on the side.  Patient has tachycardia, tachypnea and a leukocytosis of 17,000.  Will start septic work-up.  Will give IV clindamycin for cellulitis.  She has no obvious other source at this time.  Urine does not appear infected.  Chest x-ray appears clear.  Will  obtain COVID swab.  Her ideal body weight is 68 kg.  Will give 30 mL/kg IV fluid bolus based on this weight.  Will give pain and nausea medicine.  Anticipate admission.  ED PROGRESS: Patient's rectal temperature 102.1.  Will give Tylenol.  COVID swab is negative.  Suspect cellulitis as the cause of her sepsis today.  Will admit to internal medicine.   3:06 AM Discussed patient's case with IM resident.  I have recommended admission and patient (and family if present) agree with this plan. Admitting physician will place admission orders.   I reviewed all nursing notes, vitals, pertinent previous records, EKGs, lab and urine results, imaging (as available).    EKG Interpretation  Date/Time:  Wednesday February 13 2019 00:43:21 EDT Ventricular Rate:  120 PR Interval:    QRS Duration: 86 QT Interval:  326 QTC Calculation: 461 R Axis:   50 Text Interpretation:  Sinus tachycardia Borderline T abnormalities, lateral leads No significant change since last tracing Confirmed by Pryor Curia [phone]) on 02/13/2019 2:32:17 AM        CRITICAL CARE Performed by: Cyril Mourning Cynethia Schindler   Total critical care time: 55 minutes  Critical care time was exclusive of separately billable procedures and treating other patients.  Critical care was necessary to treat or prevent imminent or life-threatening deterioration.  Critical care was time spent personally by me on the following activities: development of treatment plan with patient and/or surrogate as well as nursing, discussions with consultants, evaluation of patient's response to treatment, examination of patient, obtaining history from patient or surrogate, ordering and performing treatments and interventions, ordering and review of laboratory studies, ordering and review of radiographic studies, pulse oximetry and re-evaluation of patient's condition.   Stephannie C Vannostrand was evaluated in Emergency Department on 02/13/2019 for the symptoms described in  the history of present illness. She was evaluated in the context of the global COVID-19 pandemic, which necessitated consideration that the patient might be at risk for infection with the SARS-CoV-2 virus that causes COVID-19. Institutional protocols and algorithms that pertain to the evaluation of patients at risk for COVID-19 are in a state of rapid change based on information released by regulatory bodies including the CDC and federal and state organizations. These policies and algorithms were followed during the patient's care in the ED.    Sohrab Keelan, Delice Bison, DO 02/13/19 [phone]

## 2019-02-13 NOTE — ED Notes (Signed)
Admitting at bedside 

## 2019-02-13 NOTE — Progress Notes (Signed)
Subjective: Patient reports that she has pain at the bottom of her stump, she was unsteady on her prosthetic. She reports that she took about 1 week of penicillin. She started having chills, pain behind her eyes, coughing, vomiting, and decreased oral intake. She reports that the symptoms are similar to when she had cellulitis in the past. She is still having significant pain at this time.   She reports that she does not check her blood sugars at home, she is on victoza and novolog at home.   Objective:  Vital signs in last 24 hours: Vitals:   02/13/19 0500 02/13/19 0503 02/13/19 0530 02/13/19 0630  BP:   137/90   Pulse:   (!) 106 (!) 101  Resp:   (!) 29 18  Temp:  98.3 F (36.8 C)    TempSrc:      SpO2:   99% 97%  Weight: (!) 137 kg     Height: 5\' 10"  (1.778 m)       Physical Exam Constitutional:      Appearance: She is obese.  Cardiovascular:     Rate and Rhythm: Regular rhythm. Tachycardia present.     Heart sounds: No murmur. No friction rub. No gallop.   Pulmonary:     Effort: Pulmonary effort is normal.     Breath sounds: No wheezing, rhonchi or rales.  Musculoskeletal:        General: Tenderness (On palpation of L.BKA stump) present. No swelling.     Comments: L.BKA  Skin:    General: Skin is warm and dry.     Findings: No erythema.     Comments: L.BKA stump, Left leg mildly warmer distally  Neurological:     Mental Status: She is alert.     Assessment/Plan:  Principal Problem:   Status post below knee amputation, left (HCC) Active Problems:   Controlled type 2 diabetes mellitus (HCC)   Chronic kidney disease (CKD), stage III (moderate) (HCC)   Anemia of chronic disease   Cellulitis  Patient is a 43 year old with past medical history PE 2 DM, lipidemia, CHF, HTN, CKD stage III, BKA who presents with a week and a half of symptoms patient reports are consistent with previous cellulitis of Left stump.   #Possible cellulits vs ostemyelitis of L. BKA  Patient reports similar symptoms as her previous cellulitis infections, cellulitis not apparent on exam. Took Penicillin for one week with initial improvement in symptoms. Patient tender to palpation and possible tender lymph node in groin. Other signs of infection on presentation include fever, reported chills, tachycardia, with elevated wbc count with left shift. Suspicion for sepsis, but  Lactic acid 1. No signs of end organ damage. Likely cellulitis , but suspicion for underlying osteomyelitis.  - continue Clindamycin - f/u blood cultures - MRI Left BKA  #Possible STI Patient found to have bacterial vaginosis in August. Neg RPR, gonorrhea, and chlamydia ,and HIV in August. Patient concerned about unprotected sex and possible recent infection. Bacterial vaginosis infection could put patient at risk for contracting STI with further episodes, will send labs  -HIV, RPR, Gonorrhea, and Chlamydia    #T2DM Patient on Victoza and Novolog outpatient. HGB A1c 7 - Lantus 10 units qhs - Novolog SSI-M  #Anemia  Patient with history of anemia of chronic disease. Hgb dropped from 11 to 9.6 since admission, has received 2.3L of fluid. No signs of blood loss. Will continue to monitor.  #Anxiety - continue fluoxetine, amitriptyline, and hydroxyzine   Diet: Carb  modified   VTE ppx: Lovenox  Code Status: FULL   Dispo: Anticipated discharge is pending clinical improvement.  Tamsen Snider, MD PGY1  [phone]

## 2019-02-13 NOTE — Progress Notes (Signed)
Patient stated has had some brownish drainage when wears prosthesis. Greater tenderness to posterior stump than anterior area; no drainage observed.   Paged Int med at 2036 about pain med. Spoke with MD Marianna Payment via phone at 2050. Informed patient stated she prev took Percocet and did not help pain and when patient informed also has Tylenol available, patient stated Tylenol would not help pain. Informed patient complained of migraine like pain behind eyes of 8/10, lower back pain of 7/10 and shooting pain in stump. Coe advised to administer pain medication available and then re-assess. Marianna Payment stated would come to bedside to speak with patient. Spoke with Clio face to face at 2200, advised to administer pain medication, reassess in one hour and if pain not relieved to notify him.   0028 explained visitor policy. Patient inquired about 77 year old daughter visiting, informed patient individuals under 49 years old not allowed and only one designated visitor for duration of visit. Allowed patient time to decide upon designated visitor.    During reassessment at [place] patient complained of 7/10 stump and lower back pain.   Paged Int Med at [place] and informed patient still complaining of stump pain and lower back pain 7/10. Coe MD indicated stump pain likely due to phantom pain and lower back pain muscular. Stated will review chart and place orders.   Received call from [place] at [phone] and informed to keep reassessing patient's pain. Informed did not give 2.5 mg oxy because patient was asleep.   Paged MD Coe at [phone] and informed patient BP 159/103 at 0500. 2.5mg  oxy given at 0452 for pain.

## 2019-02-13 NOTE — ED Notes (Signed)
Cleaned patient up and changed the linen patient is resting with call bell in reach

## 2019-02-13 NOTE — ED Notes (Signed)
SDU ordered bfast 

## 2019-02-13 NOTE — Progress Notes (Signed)
Patient offered CPAP but she declined stating she does not wear one at home.  She was instructed to alert RN to call RT if she changed her mind.

## 2019-02-14 ENCOUNTER — Other Ambulatory Visit: Payer: Self-pay | Admitting: Internal Medicine

## 2019-02-14 ENCOUNTER — Inpatient Hospital Stay (HOSPITAL_COMMUNITY): Payer: Medicare Other

## 2019-02-14 DIAGNOSIS — N179 Acute kidney failure, unspecified: Secondary | ICD-10-CM

## 2019-02-14 DIAGNOSIS — E1165 Type 2 diabetes mellitus with hyperglycemia: Secondary | ICD-10-CM

## 2019-02-14 DIAGNOSIS — Z202 Contact with and (suspected) exposure to infections with a predominantly sexual mode of transmission: Secondary | ICD-10-CM

## 2019-02-14 LAB — CBC
HCT: 27.8 % — ABNORMAL LOW (ref 36.0–46.0)
Hemoglobin: 8.9 g/dL — ABNORMAL LOW (ref 12.0–15.0)
MCH: 27.4 pg (ref 26.0–34.0)
MCHC: 32 g/dL (ref 30.0–36.0)
MCV: 85.5 fL (ref 80.0–100.0)
Platelets: 232 10*3/uL (ref 150–400)
RBC: 3.25 MIL/uL — ABNORMAL LOW (ref 3.87–5.11)
RDW: 13.5 % (ref 11.5–15.5)
WBC: 5.7 10*3/uL (ref 4.0–10.5)
nRBC: 0 % (ref 0.0–0.2)

## 2019-02-14 LAB — BASIC METABOLIC PANEL
Anion gap: 7 (ref 5–15)
BUN: 21 mg/dL — ABNORMAL HIGH (ref 6–20)
CO2: 23 mmol/L (ref 22–32)
Calcium: 7.8 mg/dL — ABNORMAL LOW (ref 8.9–10.3)
Chloride: 107 mmol/L (ref 98–111)
Creatinine, Ser: 2.03 mg/dL — ABNORMAL HIGH (ref 0.44–1.00)
GFR calc Af Amer: 34 mL/min — ABNORMAL LOW (ref >60)
GFR calc non Af Amer: 29 mL/min — ABNORMAL LOW (ref >60)
Glucose, Bld: 161 mg/dL — ABNORMAL HIGH (ref 70–99)
Potassium: 4.5 mmol/L (ref 3.5–5.1)
Sodium: 137 mmol/L (ref 135–145)

## 2019-02-14 LAB — RPR: RPR Ser Ql: NONREACTIVE

## 2019-02-14 LAB — GLUCOSE, CAPILLARY
Glucose-Capillary: 136 mg/dL — ABNORMAL HIGH (ref 70–99)
Glucose-Capillary: 114 mg/dL — ABNORMAL HIGH (ref 70–99)
Glucose-Capillary: 132 mg/dL — ABNORMAL HIGH (ref 70–99)
Glucose-Capillary: 146 mg/dL — ABNORMAL HIGH (ref 70–99)
Glucose-Capillary: 113 mg/dL — ABNORMAL HIGH (ref 70–99)

## 2019-02-14 LAB — C-REACTIVE PROTEIN: CRP: 13.6 mg/dL — ABNORMAL HIGH (ref <1.0)

## 2019-02-14 LAB — MRSA PCR SCREENING: MRSA by PCR: NEGATIVE

## 2019-02-14 MED ORDER — LACTATED RINGERS IV BOLUS
1000.0000 mL | Freq: Once | INTRAVENOUS | Status: AC
  Administered 2019-02-15: 02:00:00 1000 mL via INTRAVENOUS

## 2019-02-14 MED ORDER — CEFAZOLIN SODIUM-DEXTROSE 2-4 GM/100ML-% IV SOLN
2.0000 g | Freq: Three times a day (TID) | INTRAVENOUS | Status: DC
  Administered 2019-02-14 – 2019-02-15 (×3): 2 g via INTRAVENOUS
  Filled 2019-02-14 (×4): qty 100

## 2019-02-14 MED ORDER — SODIUM CHLORIDE 0.9 % IV BOLUS
1000.0000 mL | Freq: Once | INTRAVENOUS | Status: AC
  Administered 2019-02-14: 1000 mL via INTRAVENOUS

## 2019-02-14 MED ORDER — OXYCODONE HCL 5 MG PO TABS
2.5000 mg | ORAL_TABLET | Freq: Once | ORAL | Status: AC
  Administered 2019-02-14: 2.5 mg via ORAL
  Filled 2019-02-14: qty 1

## 2019-02-14 MED ORDER — SODIUM CHLORIDE 0.9 % IV SOLN
INTRAVENOUS | Status: DC | PRN
  Administered 2019-02-14: 07:00:00 10 mL/h via INTRAVENOUS

## 2019-02-14 NOTE — Progress Notes (Signed)
Subjective:   Patient in bed on exam. Reports her presenting symptoms have improved and no longer having pain behind her eyes. The pain in her left stump is still present and feels like a cramp. She is updated on her test results and MRI. Pt ask the team to call her aunt this afternoon to share these results.   Objective:  Vital signs in last 24 hours: Vitals:   02/14/19 0042 02/14/19 0500 02/14/19 0821 02/14/19 1226  BP:  (!) 159/103 (!) 167/111 135/89  Pulse:  75 80 79  Resp:  18 18 17   Temp:  97.6 F (36.4 C) (!) 97.5 F (36.4 C) 97.8 F (36.6 C)  TempSrc:      SpO2: 98% 100% 100% 100%  Weight:      Height:        Physical Exam Constitutional:      Appearance: She is obese.  Cardiovascular:     Rate and Rhythm: Normal rate and regular rhythm.     Heart sounds: No murmur. No friction rub. No gallop.   Pulmonary:     Effort: Pulmonary effort is normal.     Breath sounds: No wheezing, rhonchi or rales.  Musculoskeletal:        General: Tenderness (On palpation of L.BKA stump) present. No swelling.     Comments: L.BKA  Skin:    General: Skin is warm and dry.     Findings: No erythema.     Comments: L.BKA stump, Left leg mildly warmer distally  Neurological:     Mental Status: She is alert.     Assessment/Plan:  Principal Problem:   Edema of amputation stump of left lower extremity (HCC) Active Problems:   Hypertension associated with diabetes ([place])   Controlled type 2 diabetes mellitus ([place])   Morbid obesity due to excess calories (HCC)   Chronic kidney disease (CKD), stage III (moderate) (HCC)   Heart failure with preserved ejection fraction (HCC)   Status post below knee amputation, left (HCC)   Anemia of chronic disease   Cellulitis   HIV exposure  Patient is a 43 year old with past medical history PE 2 DM, lipidemia, CHF, HTN, CKD stage III, L. BKA who presents with a cellulitis and myositis of left stump.    #Cellulits and myositis of distal stump  of the knee MRI of distal stump shows cellulitis and myositis, no evidence of osteomyelitis or abscess. Patient afebrile , WBC trended down, and symptoms are improving. No abscess on MRI or purulent drainage history, will deescalate antibiotics. BCx ngtd - Cefazolin  - f/u blood cultures  #AKI on CKD stage 3 Patient with history of ARF 2/2 to Vancomycin. Patient with Azotemia (BUN 21, Cr 2.03) , denies Oliguria. Cr 1.55 on admission, appears to be baseline. Likely prerenal in setting of recent illness and hypotension. U/A on admission and previous UA with nephrotic range proteinuria. Will consider further workup if patient Cr does not respond to fluids. Gave 1L bolus NS.  - 1L  LR over 2 hours    #Possible STI Patient found to have bacterial vaginosis in August. Neg RPR, gonorrhea, and chlamydia ,and HIV in August. Patient concerned about unprotected sex and possible recent infection. Bacterial vaginosis infection could put patient at risk for contracting STI . HIV negative, RPR negative. - waiting urine collection , Gonorrhea, and Chlamydia    #T2DM Patient on Victoza and Novolog outpatient. HGB A1c 7 - Lantus 10 units qhs - Novolog SSI-M  #Anemia  Patient  with history of anemia of chronic disease. Hgb dropped from 11 to 8.9 since admission, has received 2.3L of fluid. No signs of blood loss. Will continue to monitor.  #Anxiety - continue fluoxetine, amitriptyline, and hydroxyzine   Diet: Carb modified   VTE ppx: Lovenox  Code Status: FULL   Dispo: Anticipated discharge is pending clinical improvement.   Tamsen Snider, MD PGY1  [phone]

## 2019-02-14 NOTE — Progress Notes (Signed)
Patient returned back from [place] at 53.  Informed by outgoing day RN patient concerned about amount of fluids receiving and did not give ordered LR bolus given patient's concerns and plan to follow up with MD. During shift assessment this RN inquired about patient's concerns regarding IV fluid bolus. Patient stated received fluids earlier during day and is willing to receive another bag of fluids, but concerned about receiving any additional fluids due to concerns about kidney function and swelling. Informed patient will follow up with MD given patient's concerns.   Patient did not receive dinner tray as ordered; patient provided snack and TV dinner available on the unit.   Paged Int Med at 2112. Spoke with MD Helberg at 2117. Informed plan is to give patient 2L bolus then draw labs in morning to reevaluate renal function; Cr.  Approximately 0100 RN observed patient use prosthesis to ambulate to restroom, some discomfort at stump site due to swelling. RN observed scant/ few drops of brown drainage, water consistency, in sleeve of stump. Informed day RN of drainage observed.   Paged Int med at 2335. Lost IV access, will delay LR bolus and ABX and plan to draw labs. Spoke with Coe at 2337 and informed of delay of bolus and therefore lab collection due to lost IV access. Marianna Payment will review chart and modify lab collection.  Paged MD Coe at [phone], informed modified lab collection for BMP and CBC to 0700. Started LR bolus at 0218 after new IV started. Spoke with Coe at [phone], will keep 0500 lab collection time and repeat labs if necessary.  Paged MD Coe at [phone] informed pt. BP 174/102 at 0424 and 171/111 at 0623. Spoke with Coe via phone at [phone], informed patient has swelling non pitting at stump, however, has been up ambulating to bathroom x2 overnight. Advised to stay off stump until day team can round and reevaluate. Informed patient of need to stay off stump until day team reevaluates.

## 2019-02-14 NOTE — Progress Notes (Addendum)
  Date: 02/14/2019  Patient name: [name] [name]  Medical record number: [id number]  Date of birth: 12-Feb-1976   I have seen and evaluated this patient and I have discussed the plan of care with the house staff. Please see their note for complete details. I concur with their findings with the following additions/corrections:   MRI showed cellulitis and myositis but no bone involvement, will transition to cefazolin with no purulent collections to avoid C diff risk associated with clindamycin. On exam, still warm and mildly tender, but somewhat improved.   AKI has worsened, Cr 2.0 today, checking renal US for obstruction, UA, giving fluids as likely prerenal given presentation. However, she does have baseline CKD III with proteinuria, unclear etiology, may require further investigation.   As mentioned yesterday, HIV Ab/Ag is negative, but possibly within window, will send PCR. RPR is negative.   Lenice Pressman, M.D., Ph.D. 02/14/2019, 5:12 PM  Addendum: For documentation purposes, please note she had sepsis as evidenced by fever, tachycardia, and tachypnea and a known source of infection (stump cellulitis and mysositis) that resolved with IV fluids and antibiotics.

## 2019-02-14 NOTE — Progress Notes (Signed)
[name] [name] is a 43 y.o. female patient admitted from ED awake, alert - oriented  X 4 - no acute distress noted.  VSS - Blood pressure (!) 167/111, pulse 80, temperature (!) 97.5 F (36.4 C), resp. rate 18, height 5\' 10"  (1.778 m), weight 132.7 kg, SpO2 100 %.    IV in place, occlusive dsg intact without redness.  Orientation to room, and floor completed with information packet given to patient/family.  Patient declined safety video at this time.  Admission INP armband ID verified with patient/family, and in place.    SR up x 2, fall assessment complete, with patient and family able to verbalize understanding of risk associated with falls, and verbalized understanding to call nsg before up out of bed.  Call light within reach, patient able to voice, and demonstrate understanding.  Skin to skin assessment completed with second RN. Skin, clean-dry- intact without evidence of bruising, or skin tears.    No evidence of skin break down noted on exam.    Will cont to eval and treat per MD orders.  Howard Pouch, RN 02/14/2019 9:08 AM

## 2019-02-15 DIAGNOSIS — D631 Anemia in chronic kidney disease: Secondary | ICD-10-CM

## 2019-02-15 DIAGNOSIS — Z8742 Personal history of other diseases of the female genital tract: Secondary | ICD-10-CM

## 2019-02-15 DIAGNOSIS — M609 Myositis, unspecified: Secondary | ICD-10-CM

## 2019-02-15 LAB — HIV-1 RNA QUANT-NO REFLEX-BLD
HIV 1 RNA Quant: <20 copies/mL
LOG10 HIV-1 RNA: UNDETERMINED log10copy/mL

## 2019-02-15 LAB — BASIC METABOLIC PANEL
Anion gap: 7 (ref 5–15)
BUN: 24 mg/dL — ABNORMAL HIGH (ref 6–20)
CO2: 22 mmol/L (ref 22–32)
Calcium: 7.9 mg/dL — ABNORMAL LOW (ref 8.9–10.3)
Chloride: 105 mmol/L (ref 98–111)
Creatinine, Ser: 1.75 mg/dL — ABNORMAL HIGH (ref 0.44–1.00)
GFR calc Af Amer: 41 mL/min — ABNORMAL LOW (ref >60)
GFR calc non Af Amer: 35 mL/min — ABNORMAL LOW (ref >60)
Glucose, Bld: 172 mg/dL — ABNORMAL HIGH (ref 70–99)
Potassium: 5 mmol/L (ref 3.5–5.1)
Sodium: 134 mmol/L — ABNORMAL LOW (ref 135–145)

## 2019-02-15 LAB — URINE CYTOLOGY ANCILLARY ONLY
Chlamydia: NEGATIVE
Neisseria Gonorrhea: NEGATIVE

## 2019-02-15 LAB — CBC
HCT: 28.4 % — ABNORMAL LOW (ref 36.0–46.0)
Hemoglobin: 8.7 g/dL — ABNORMAL LOW (ref 12.0–15.0)
MCH: 26.9 pg (ref 26.0–34.0)
MCHC: 30.6 g/dL (ref 30.0–36.0)
MCV: 87.7 fL (ref 80.0–100.0)
Platelets: 229 10*3/uL (ref 150–400)
RBC: 3.24 MIL/uL — ABNORMAL LOW (ref 3.87–5.11)
RDW: 13.5 % (ref 11.5–15.5)
WBC: 4.9 10*3/uL (ref 4.0–10.5)
nRBC: 0 % (ref 0.0–0.2)

## 2019-02-15 LAB — GLUCOSE, CAPILLARY
Glucose-Capillary: 167 mg/dL — ABNORMAL HIGH (ref 70–99)
Glucose-Capillary: 89 mg/dL (ref 70–99)

## 2019-02-15 MED ORDER — ENOXAPARIN SODIUM 80 MG/0.8ML ~~LOC~~ SOLN: 65.0000 mg | SUBCUTANEOUS | Status: DC

## 2019-02-15 MED ORDER — AMLODIPINE BESYLATE 10 MG PO TABS: 10.0000 mg | ORAL_TABLET | Freq: Every day | ORAL | Status: DC

## 2019-02-15 MED ORDER — CEPHALEXIN 500 MG PO CAPS: 500.0000 mg | ORAL_CAPSULE | Freq: Four times a day (QID) | ORAL | 0 refills | Status: AC

## 2019-02-15 MED ORDER — OXYCODONE-ACETAMINOPHEN 5-325 MG PO TABS: 1.0000 | ORAL_TABLET | Freq: Three times a day (TID) | ORAL | 0 refills | Status: DC | PRN

## 2019-02-15 NOTE — Progress Notes (Signed)
Subjective: Patient reports that she is feeling very tight and swollen in her stump. Headache has improved, and back pain has improved. She reports that there is still some warmth in her stump. She is concerned about why she is getting so much fluid, she states that she does not clear fluid normally. She reports that not all her urine is being collected.   Objective:  Vital signs in last 24 hours: Vitals:   02/15/19 0036 02/15/19 0424 02/15/19 0623 02/15/19 0828  BP: 106/79 (!) 174/102 (!) 171/111 (!) 159/97  Pulse: 78 71 75 80  Resp: 18 19  20   Temp: 97.7 F (36.5 C) (!) 97.5 F (36.4 C)  98.2 F (36.8 C)  TempSrc:      SpO2: 100% 97%  100%  Weight:      Height:        Physical Exam Constitutional:      Appearance: Normal appearance.  Cardiovascular:     Rate and Rhythm: Normal rate and regular rhythm.  Pulmonary:     Effort: Pulmonary effort is normal.     Breath sounds: Normal breath sounds.  Musculoskeletal:        General: Tenderness (tenderness on palpation of distal left stump) present.     Comments: L.Stump mild edema  Skin:    General: Skin is warm and dry.  Neurological:     Mental Status: She is alert.        Assessment/Plan:  Principal Problem:   Edema of amputation stump of left lower extremity (HCC) Active Problems:   Hypertension associated with diabetes ([place])   Controlled type 2 diabetes mellitus ([place])   Morbid obesity due to excess calories (HCC)   Chronic kidney disease (CKD), stage III (moderate) (HCC)   Heart failure with preserved ejection fraction (HCC)   Status post below knee amputation, left (HCC)   Anemia of chronic disease   Cellulitis   HIV exposure   Patient is a 43 year old with past medical history PE 2 DM, lipidemia, CHF, HTN, CKD stage III, L. BKA who presents with a cellulitis and myositis of left stump.    #Cellulits and myositis of distal stump of the knee MRI of distal stump shows cellulitis and myositis, no  evidence of osteomyelitis or abscess. Patient afebrile , WBC trended down, and symptoms are improving. No abscess on MRI or purulent drainage history, deescalate antibiotics to Cefazolin. Will send patient out on PO Keflex x 7 days to finish 10 day course of antibiotics. BCx ngtd 3 days - Cefazolin  - f/u blood cultures  #AKI on CKD stage 3 Patient with history of ARF 2/2 to Vancomycin. Patient with Azotemia (BUN 21, Cr 2.03) , denies Oliguria. Cr 1.55 on admission, appears to be baseline. Likely prerenal in setting of recent illness and hypotension. U/A on admission and previous UA with nephrotic range proteinuria. Creatinine downtrend with 2L IV fluids yesterday.   #Possible STI Patient found to have bacterial vaginosis in August. Neg RPR, gonorrhea, and chlamydia ,and HIV in August. Patient concerned about unprotected sex and possible recent infection. Bacterial vaginosis infection could put patient at risk for contracting STI . HIV negative, RPR negative. - will have PCP follow up on HIV RNA quant and Gon/Chlam test. Patient asymptomatic.   #T2DM Patient on Victoza and Novolog outpatient. HGB A1c 7 - Lantus 10 units qhs - Novolog SSI-M  #Anemia  Patient with history of anemia of chronic disease. Hgb dropped from 11 to 8.9 since admission, has received  2.3L of fluid. No signs of blood loss. Will continue to monitor.  #Anxiety - continue fluoxetine, amitriptyline, and hydroxyzine   #HTN - Restarted Norvasc  Diet: Carb modified   VTE ppx: Lovenox  Code Status: FULL   Dispo: Anticipated discharge today.  Tamsen Snider, MD PGY1  [phone]

## 2019-02-15 NOTE — Progress Notes (Signed)
Nutrition Brief Note  RD consulted for assessment of nutritional requirements/ status.   Wt Readings from Last 15 Encounters:  02/13/19 132.7 kg  01/10/19 136.1 kg  12/04/18 (!) 137.5 kg  11/08/18 (!) 137 kg  10/23/18 133.4 kg  09/14/18 133.4 kg  06/29/18 133.4 kg  06/28/18 133.4 kg  06/11/18 (!) 148.3 kg  05/11/18 (!) 148.3 kg  05/01/18 (!) 148.6 kg  04/09/18 (!) 153.8 kg  03/28/18 (!) 148.8 kg  03/12/18 (!) 148.8 kg  03/05/18 (!) 148.8 kg   [name] [name] is a 43 year old female with a past medical history of diabetes mellitus, , hyperlipidemia, CHF, hypertension, chronic kidney disease stage III, and status post knee amputation.  She was in her usual state of health until a week and a half ago and presented to the ED with fever, tachycardia, tachypnea, and an elevated white blood cell count with a history concerning for sepsis due to cellulitis of her left BKA stump.  Pt admitted with sepsis secondary to cellulitis of lt BKA stump.   Spoke with pt at bedside, who was pleasant and in good spirits at time of visit. She reports good appetite, consuming all of her breakfast. She endorses nausea, which is being controlled well with anti-emetics. Pt with good appetite at home, consuming 3 meals per day. She has been increasing fruits and vegetables, as well as decreasing simple carbohydrate intake to help with better DM control. She also admits to intentional weight loss through lifestyle changes.   Nutrition-Focused physical exam completed. Findings are no fat depletion, no muscle depletion, and no edema.   Lab Results  Component Value Date   HGBA1C 7.0 (A) 11/08/2018   Pt reports improved glycemic control since starting on victoza approximately one year ago.   Labs reviewed: CBGS: 100-158 (inpatient orders for glycemic control are 0-15 units insulin aspart TID with meals and 10 units insulin gargine q HS ).   Body mass index is 41.98 kg/m. Patient meets criteria for morbid  obesity,  based on current BMI.   Current diet order is carb modified, patient is consuming approximately 100% of meals at this time. Labs and medications reviewed.   No nutrition interventions warranted at this time. If nutrition issues arise, please consult RD.   [name] [name] A. Jimmye Norman, RD, LDN, [place] Registered Dietitian II Certified Diabetes Care and Education Specialist Pager: [phone] After hours Pager: [phone]

## 2019-02-15 NOTE — Consult Note (Signed)
   [facility] CM Inpatient Consult   02/15/2019  [name] [name] [birth year] [id number]    Patientwaschecked forpotential [place] Management services neededunderher Medicare/ [employer] ACO plan. She has 29%high risk scorefor unplanned readmission and hospitalization. Previous outreach attempts by [facility] LCSW for social work needs and services but without success.  Perchart reviwed andMD note 02/14/19 show as follows: Patient is a 43 year old with past medical history PE 2 DM, lipidemia, CHF, HTN, CKD stage III, L BKA,   who presents with a cellulitis and myositis of left stump, acute kidney failure on CKD stage III.   Her primary Care Provider isDr. Ina Homes with [facility],listed to provide transition of care.  Patient transitionedto home prior to speaking withher.  Plan: Will followpatientwith EMMI Generalcalls to follow-up recovery.   For questionsand additional information,please call:  Chevis Weisensel A. Cal Gindlesperger, BSN, RN-BC [facility] Liaison Cell: [phone]

## 2019-02-15 NOTE — Discharge Instructions (Signed)
[name] [name],   It has been a pleasure working with you and we are glad you're feeling better. You were hospitalized for an skin infection of the area around your stump. START taking Keflex for the next 7 days   Follow up with your primary care provider in 1-2 weeks  If your symptoms worsen or you develop new symptoms, please seek medical help whether it is your primary care provider or emergency department.  If you have any questions about this hospitalization please call [phone].

## 2019-02-15 NOTE — Progress Notes (Signed)
AVS reviewed, Keflex delivered from pharmacy, education provided. Percocet med verified at [facility] in [place].  PIV removed. Discharge home with sister via wheelchair.

## 2019-02-16 ENCOUNTER — Other Ambulatory Visit: Payer: Self-pay | Admitting: Internal Medicine

## 2019-02-16 DIAGNOSIS — M170 Bilateral primary osteoarthritis of knee: Secondary | ICD-10-CM

## 2019-02-16 DIAGNOSIS — Z89512 Acquired absence of left leg below knee: Secondary | ICD-10-CM

## 2019-02-16 MED ORDER — OXYCODONE-ACETAMINOPHEN 5-325 MG PO TABS: 1.0000 | ORAL_TABLET | Freq: Three times a day (TID) | ORAL | 0 refills | Status: AC | PRN

## 2019-02-17 LAB — CULTURE, BLOOD (ROUTINE X 2)
Culture: NO GROWTH
Culture: NO GROWTH

## 2019-02-17 NOTE — Progress Notes (Signed)
Things That May Be Affecting Your Health:  Alcohol  Hearing loss  Pain    Depression  Home Safety  Sexual Health  X Diabetes  Lack of physical activity  Stress   Difficulty with daily activities  Loneliness  Tiredness   Drug use X Medicines  Tobacco use   Falls  Motor Vehicle Safety X Weight   Food choices  Oral Health  Other    YOUR PERSONALIZED HEALTH PLAN : 1. Schedule your next subsequent Medicare Wellness visit in one year 2. Attend all of your regular appointments to address your medical issues 3. Complete the preventative screenings and services   Annual Wellness Visit   Medicare Covered Preventative Screenings and [place] Men and Women Who How Often Need? Date of Last Service Action  Abdominal Aortic Aneurysm Adults with AAA risk factors Once     Alcohol Misuse and Counseling All Adults Screening once a year if no alcohol misuse. Counseling up to 4 face to face sessions.     Bone Density Measurement  Adults at risk for osteoporosis Once every 2 yrs     Lipid Panel Z13.6 All adults without CV disease Once every 5 yrs     Colorectal Cancer   Stool sample or  Colonoscopy All adults 60 and older   Once every year  Every 10 years     Depression All Adults Once a year  Today   Diabetes Screening Blood glucose, post glucose load, or GTT Z13.1  All adults at risk  Pre-diabetics  Once per year  Twice per year     Diabetes  Self-Management Training All adults Diabetics 10 hrs first year; 2 hours subsequent years. Requires Copay     Glaucoma  Diabetics  Family history of glaucoma  African Americans 25 yrs +  Hispanic Americans 21 yrs + Annually - requires coppay     Hepatitis C Z72.89 or F19.20  High Risk for HCV  Born between 1945 and 1965  Annually  Once     HIV Z11.4 All adults based on risk  Annually btw ages 14 & 74 regardless of risk  Annually > 65 yrs if at increased risk     Lung Cancer Screening Asymptomatic adults aged  82-77 with 30 pack yr history and current smoker OR quit within the last 15 yrs Annually Must have counseling and shared decision making documentation before first screen     Medical Nutrition Therapy Adults with   Diabetes  Renal disease  Kidney transplant within past 3 yrs 3 hours first year; 2 hours subsequent years     Obesity and Counseling All adults Screening once a year Counseling if BMI 30 or higher  Today   Tobacco Use Counseling Adults who use tobacco  Up to 8 visits in one year     Vaccines Z23  Hepatitis B  Influenza   Pneumonia  Adults   Once  Once every flu season  Two different vaccines separated by one year     Next Annual Wellness Visit People with Medicare Every year  Today     Services & Screenings Women Who How Often Need  Date of Last Service Action  Mammogram  Z12.31 Women over 55 One baseline ages 29-39. Annually ager 40 yrs+     Pap tests All women Annually if high risk. Every 2 yrs for normal risk women     Screening for cervical cancer with   Pap (Z01.419 nl or Z01.411abnl) &  HPV Z11.51 Women aged 26 to 45 Once every 5 yrs     Screening pelvic and breast exams All women Annually if high risk. Every 2 yrs for normal risk women     Sexually Transmitted Diseases  Chlamydia  Gonorrhea  Syphilis All at risk adults Annually for non pregnant females at increased risk      [place] Men Who How Ofter Need  Date of Last Service Action  Prostate Cancer - DRE & PSA Men over 50 Annually.  DRE might require a copay.     Sexually Transmitted Diseases  Syphilis All at risk adults Annually for men at increased risk

## 2019-02-17 NOTE — Progress Notes (Signed)
Done. Thank you.

## 2019-02-20 ENCOUNTER — Other Ambulatory Visit: Payer: Self-pay

## 2019-02-20 DIAGNOSIS — I998 Other disorder of circulatory system: Secondary | ICD-10-CM

## 2019-02-21 ENCOUNTER — Ambulatory Visit (INDEPENDENT_AMBULATORY_CARE_PROVIDER_SITE_OTHER): Payer: Medicare Other | Admitting: Licensed Clinical Social Worker

## 2019-02-21 ENCOUNTER — Encounter: Payer: Medicare Other | Admitting: Internal Medicine

## 2019-02-21 ENCOUNTER — Telehealth: Payer: Self-pay | Admitting: Internal Medicine

## 2019-02-21 ENCOUNTER — Encounter: Payer: Self-pay | Admitting: Licensed Clinical Social Worker

## 2019-02-21 DIAGNOSIS — F419 Anxiety disorder, unspecified: Secondary | ICD-10-CM

## 2019-02-21 DIAGNOSIS — F331 Major depressive disorder, recurrent, moderate: Secondary | ICD-10-CM

## 2019-02-21 NOTE — BH Specialist Note (Signed)
Integrated Behavioral Health Follow Up Visit  MRN: [id number] Name: [name] [name]  Session Start time: 10:45  Session End time: 11:45 Total time: 1 hour  Type of Service: Integrated Behavioral Health- Individual Interpretor:No.   SUBJECTIVE: [name] [name] is a 43 y.o. female  whom attended the session individually.  Patient was referred by Dr. Tarri Abernethy for depression.  Patient reports the following symptoms/concerns: anxiety, adjustment to loss of relationship, health challenges, and interpersonal issues.  Duration of problem: increased over the past two months; Severity of problem: mild  OBJECTIVE: Mood: Anxious and Affect: Appropriate Risk of harm to self or others: No plan to harm self or others  GOALS ADDRESSED: Patient will: 1.  Reduce symptoms of: anxiety, depression and stress  2.  Increase knowledge and/or ability of: coping skills, healthy habits and stress reduction  3.  Demonstrate ability to: Increase healthy adjustment to current life circumstances and Increase adequate support systems for patient/family  INTERVENTIONS: Interventions utilized:  Motivational Interviewing, Mindfulness or Relaxation Training and Supportive Counseling Standardized Assessments completed: assessed for SI, HI, and self-harm.  ASSESSMENT: Patient currently experiencing ongoing anxiety and sadness. Patient processed a break-up, and how that has impacted her day-to-day life. Patient is contemplating staying apart from her ex-partner due to his negative behaviors during their relationship. Patient has a history of entering into co-dependent relationships. Patient reported she has always has wanted a child of her own, but has given up on this goal due to lack of a stable partner.   Patient may benefit from counseling.  PLAN: 1. Follow up with behavioral health clinician on : two weeks.   Dessie Coma, [facility], [place]

## 2019-02-21 NOTE — Discharge Summary (Addendum)
Name: [name] [name] MRN: [id number] DOB: 1975/09/09 43 y.o. PCP: [name] Homes, MD  Date of Admission: 02/12/2019  9:23 PM Date of Discharge: 02/15/2019 Attending Physician: Oda Kilts ,MD  Discharge Diagnosis: 1. Cellulitis and Myositis of distal stump of L.Knee  Discharge Medications: Allergies as of 02/15/2019       Reactions   Vancomycin Other (See Comments)   Acute renal failure suspected secondary to vanco        Medication List     STOP taking these medications    oxyCODONE-acetaminophen 5-325 MG tablet Commonly known as: PERCOCET/ROXICET       TAKE these medications    acetaminophen 325 MG tablet Commonly known as: TYLENOL Take 1-2 tablets (325-650 mg total) by mouth every 4 (four) hours as needed for mild pain.   aluminum chloride 20 % external solution Commonly known as: Drysol Apply topically at bedtime.   amitriptyline 50 MG tablet Commonly known as: ELAVIL Take 50 mg by mouth at bedtime.   amLODipine 10 MG tablet Commonly known as: NORVASC Take 1 tablet (10 mg total) by mouth daily.   atorvastatin 20 MG tablet Commonly known as: LIPITOR Take 1 tablet (20 mg total) by mouth daily.   blood glucose meter kit and supplies Kit ICD 10- E11.69. Based on patient and insurance preference. Use up to four times daily as directed.   bumetanide 1 MG tablet Commonly known as: Bumex Take 1 tablet (1 mg total) by mouth 2 (two) times daily.   cephALEXin 500 MG capsule Commonly known as: KEFLEX Take 1 capsule (500 mg total) by mouth 4 (four) times daily for 7 days.   dextromethorphan-guaiFENesin 30-600 MG 12hr tablet Commonly known as: MUCINEX DM Take 1 tablet by mouth 2 (two) times daily as needed for cough.   diclofenac sodium 1 % Gel Commonly known as: VOLTAREN Apply 4 grams to affected area 4 times daily as needed for pain   FLUoxetine 20 MG/5ML solution Commonly known as: PROZAC Take 15 mLs (60 mg total) by mouth daily.    hydrOXYzine 25 MG tablet Commonly known as: ATARAX/VISTARIL Take 1 tablet (25 mg total) by mouth every 6 (six) hours as needed for anxiety.   insulin aspart 100 UNIT/ML FlexPen Commonly known as: NOVOLOG Inject 14 Units into the skin 3 (three) times daily with meals. What changed: how much to take   iron polysaccharides 150 MG capsule Commonly known as: NIFEREX Take 1 capsule (150 mg total) by mouth 2 (two) times daily before lunch and supper.   lidocaine 5 % Commonly known as: LIDODERM Place 1 patch onto the skin daily. To left knee at 7 am and remove at 7 pm   liraglutide 18 MG/3ML Sopn Commonly known as: Victoza Inject 0.72ms (1.810mtotal) into the skin daily   lisinopril 10 MG tablet Commonly known as: ZESTRIL Take 1 tablet (10 mg total) by mouth daily.   metoprolol tartrate 25 MG tablet Commonly known as: LOPRESSOR Take 0.5 tablets (12.5 mg total) by mouth 2 (two) times daily.   mupirocin ointment 2 % Commonly known as: BACTROBAN Apply 1 application to affected area 2 times daily   Muscle Rub 10-15 % Crea Apply 1 application topically 4 (four) times daily as needed for muscle pain.   nystatin powder Generic drug: nystatin Apply topically 4 times daily. After cleaning and drying the affected area for two weeks   polyethylene glycol 17 g packet Commonly known as: MIRALAX / GLYCOLAX Take 17 g by mouth  daily. What changed:  when to take this reasons to take this   pregabalin 50 MG capsule Commonly known as: LYRICA Take 1 capsule (50 mg total) by mouth 2 (two) times daily.   senna-docusate 8.6-50 MG tablet Commonly known as: Senokot-S Take 2 tablets by mouth 2 (two) times daily.   simethicone 40 MG/0.6ML drops Commonly known as: MYLICON Take 1.2 mLs (80 mg total) by mouth 4 (four) times daily.        Disposition and follow-up:   [name] was discharged from [facility] in Stable condition.  At the hospital follow up  visit please address:  1.  Cellulitis and myositis of distal stump of L.Knee        - patient started on broad spectrum and narrowed to Cefazolin. Sent out on cephalexin x 7 days to finish 10 day course        - Assess for improvement         2. Possible STI Patient reports intercourse with HIV positive female . HIV negative, sent RNA as well for small chance (3-4 weeks period) and also negative. RPR neg. Gonorrhea and Chlamydia negative.   2.  Labs / imaging needed at time of follow-up: CBC, BMP  3.  Pending labs/ test needing follow-up:   Follow-up Appointments:    Hospital Course by problem list: 1.  #Cellulits and myositis of distal stump of the knee Presented with sepsis with fever, tachycardia, tachypnea, and leukocytosis. MRI of distal stump showed cellulitis and myositis, no evidence of osteomyelitis or abscess. Fever resolved, WBC trended down, and symptoms improved. No abscess on MRI or purulent drainage history, deescalated antibiotics to cefazolin. Sending patient out on PO cephalexin x 7 days to finish 10 day course of antibiotics. BCx ngtd    #AKI on CKD stage 3 Cr 1.55 on admission, appears to be baseline, increased to 2.0. Likely prerenal in setting of recent illness and hypotension. U/A on admission and previous UA with nephrotic range proteinuria. Creatinine downtrended with 2L IV fluids to 1.75, recommend rechecking at follow up.   #Possible STI Patient found to have bacterial vaginosis in August. Neg RPR, gonorrhea, and chlamydia ,and HIV in August. Patient concerned about exposure to HIV positive man through unprotected intercourse. Bacterial vaginosis infection could put patient at risk for contracting STI . HIV negative, RPR negative. HIV RNA quant negative and Gon/Chlam negative.   #T2DM Patient on Victoza and Novolog outpatient. HGB A1c 7. She was treated with lantus and SSI while inpatient, resume home regimen at discharge.   #Anemia  Patient with history of  anemia of chronic disease. Hgb dropped from 11 to 8.9 since admission, has received 2.3L of fluid. No signs of blood loss. Recommend rechecking at follow up.  Discharge Vitals:   BP (!) 173/98 (BP Location: Left Arm)   Pulse 76   Temp 98.4 F (36.9 C) (Oral)   Resp 18   Ht 5' 10" (1.778 m)   Wt 132.7 kg   SpO2 100%   BMI 41.98 kg/m   Pertinent Labs, Studies, and Procedures:  CBC Latest Ref Rng & Units 02/15/2019 02/14/2019 02/13/2019  WBC 4.0 - 10.5 K/uL 4.9 5.7 11.7(H)  Hemoglobin 12.0 - 15.0 g/dL 8.7(L) 8.9(L) 9.6(L)  Hematocrit 36.0 - 46.0 % 28.4(L) 27.8(L) 30.4(L)  Platelets 150 - 400 K/uL 229 232 261   CMP Latest Ref Rng & Units 02/15/2019 02/14/2019 02/13/2019  Glucose 70 - 99 mg/dL 172(H) 161(H) 147(H)  BUN 6 -  20 mg/dL 24(H) 21(H) 19  Creatinine 0.44 - 1.00 mg/dL 1.75(H) 2.03(H) 1.79(H)  Sodium 135 - 145 mmol/L 134(L) 137 133(L)  Potassium 3.5 - 5.1 mmol/L 5.0 4.5 4.0  Chloride 98 - 111 mmol/L 105 107 107  CO2 22 - 32 mmol/L _0 Calcium 8.9 - 10.3 mg/dL 7.9(L) 7.8(L) 7.7(L)  Total Protein 6.5 - 8.1 g/dL - - -  Total Bilirubin 0.3 - 1.2 mg/dL - - -  Alkaline Phos 38 - 126 U/L - - -  AST 15 - 41 U/L - - -  ALT 0 - 44 U/L - - -   9/23 MR Left Knee EXAM: MRI OF THE LEFT KNEE WITHOUT AND WITH CONTRAST   TECHNIQUE: Multiplanar, multisequence MR imaging of the left knee was performed both before and after administration of intravenous contrast.   CONTRAST:  82m GADAVIST GADOBUTROL 1 MMOL/ML IV SOLN   COMPARISON:  MRI dated 04/04/2018   FINDINGS: Bones/Joint/Cartilage   No evidence of osteomyelitis. Arthritic changes involving the medial tibial plateau and the lateral femoral condyle. Small knee joint effusion.   There is medial and lateral joint space narrowing. Anterior horn of the lateral meniscus is severely degenerated with a small free edge tear of the blunted and extruded meniscus.   Ligaments   Cruciate and collateral ligaments are intact.    Muscles and Tendons   There is edema in the muscles and subcutaneous fat around the knee particularly at the distal aspect of the stump. No discrete abscess formation.   Soft tissues   There is soft tissue edema and enhancement of the subcutaneous fat and of the muscles at the distal stump consistent with myositis and cellulitis. No definable abscess.   IMPRESSION: 1. Cellulitis and myositis of the distal stump of the left knee. 2. No evidence of osteomyelitis or soft tissue abscess. 3. Severe degeneration of the anterior horn of the lateral meniscus with a small free edge tear of the blunted and extruded meniscus.   9/24 [facility]  FINDINGS: Right Kidney:   Length: 12.6 cm x 4.2 cm x 4.5 cm, 124 cc. Echogenicity of the right renal cortex increased compared to the adjacent liver parenchyma. No hydronephrosis. Anechoic cystic structure measures 1.7 cm with through transmission and no internal flow compatible with a cyst.   Left Kidney:   Length: 12.5 cm x 5.0 cm x 4.4 cm, 145 cc. Echogenicity of the left kidney is relatively symmetric to the right. No hydronephrosis.   Bladder:   Appears normal for degree of bladder distention.   IMPRESSION: Sonographic survey negative for hydronephrosis.   Increased echogenicity of the bilateral renal parenchyma, may indicate medical renal disease.   Right-sided renal cyst     Discharge Instructions: Discharge Instructions     Call MD for:  difficulty breathing, headache or visual disturbances   Complete by: As directed    Call MD for:  difficulty breathing, headache or visual disturbances   Complete by: As directed    Call MD for:  extreme fatigue   Complete by: As directed    Call MD for:  extreme fatigue   Complete by: As directed    Call MD for:  hives   Complete by: As directed    Call MD for:  hives   Complete by: As directed    Call MD for:  persistant dizziness or light-headedness   Complete by: As directed     Call MD for:  persistant dizziness or light-headedness  Complete by: As directed    Call MD for:  persistant nausea and vomiting   Complete by: As directed    Call MD for:  persistant nausea and vomiting   Complete by: As directed    Call MD for:  redness, tenderness, or signs of infection (pain, swelling, redness, odor or green/yellow discharge around incision site)   Complete by: As directed    Call MD for:  redness, tenderness, or signs of infection (pain, swelling, redness, odor or green/yellow discharge around incision site)   Complete by: As directed    Call MD for:  severe uncontrolled pain   Complete by: As directed    Call MD for:  severe uncontrolled pain   Complete by: As directed    Call MD for:  temperature >100.4   Complete by: As directed    Call MD for:  temperature >100.4   Complete by: As directed    Diet - low sodium heart healthy   Complete by: As directed    Diet - low sodium heart healthy   Complete by: As directed    Increase activity slowly   Complete by: As directed    Increase activity slowly   Complete by: As directed        Signed:  Tamsen Snider, MD PGY1  [phone]

## 2019-02-21 NOTE — Telephone Encounter (Signed)
Tried to call to update on negative STI results from hospital, no answer, no voice mail set up. Will try MyChart message.

## 2019-02-25 ENCOUNTER — Ambulatory Visit (HOSPITAL_COMMUNITY)
Admission: RE | Admit: 2019-02-25 | Discharge: 2019-02-25 | Disposition: A | Discharge status: Home / Self Care | Payer: Medicare Other | Source: Ambulatory Visit | Attending: Family | Admitting: Family

## 2019-02-25 ENCOUNTER — Encounter: Payer: Self-pay | Admitting: Surgery

## 2019-02-25 ENCOUNTER — Other Ambulatory Visit: Payer: Self-pay

## 2019-02-25 ENCOUNTER — Encounter: Payer: Self-pay | Admitting: *Deleted

## 2019-02-25 ENCOUNTER — Other Ambulatory Visit: Payer: Self-pay | Admitting: Internal Medicine

## 2019-02-25 ENCOUNTER — Other Ambulatory Visit: Payer: Self-pay | Admitting: *Deleted

## 2019-02-25 ENCOUNTER — Ambulatory Visit (INDEPENDENT_AMBULATORY_CARE_PROVIDER_SITE_OTHER): Payer: Medicare Other | Admitting: Surgery

## 2019-02-25 VITALS — BP 182/106 | HR 85 | Temp 97.9°F | Resp 20 | Ht 70.0 in | Wt 287.9 lb

## 2019-02-25 DIAGNOSIS — I998 Other disorder of circulatory system: Secondary | ICD-10-CM

## 2019-02-25 DIAGNOSIS — Z89512 Acquired absence of left leg below knee: Secondary | ICD-10-CM

## 2019-02-25 DIAGNOSIS — M170 Bilateral primary osteoarthritis of knee: Secondary | ICD-10-CM

## 2019-02-25 NOTE — H&P (View-Only) (Signed)
Vascular and Vein Specialist of [facility]  Patient name: [name] [name] MRN: [id number] DOB: 04-03-1976 Sex: female   REQUESTING PROVIDER:    Shawn Rayburn   REASON FOR CONSULT:    Ischemic foot  HISTORY OF PRESENT ILLNESS:   [name] [name] is a 43 y.o. female, who is status post right below-knee amputation by Dr. Sharol Given several years ago for infection.  She was recently discharged from the hospital with cellulitis of her stump.  She states that she has had persistent drainage from her stump since it was performed and this is also been revised.  She does walk with a prosthesis.  Patient has a history of acute renal failure.  She is treated for depression.  She is on CPAP for obstructive sleep apnea.  She has a type 2 diabetes.  She takes a statin for hypercholesterolemia and is on ACE inhibitor for hypertension.  PAST MEDICAL HISTORY    Past Medical History:  Diagnosis Date  . Abdominal muscle pain 09/08/2016  . Abnormal Pap smear of cervix 2009  . Abscess    history of multiple abscesses  . Acute bilateral low back pain 02/13/2017  . Acute blood loss anemia   . Acute renal failure ([place]) 07/12/2012  . AKI (acute kidney injury) ([place])   . Anemia of chronic disease 2002  . Anxiety    Panic attacks  . Bilateral lower extremity edema 05/13/2016  . Bipolar disorder ([place])   . Cellulitis 05/21/2014   right eye  . Chronic bronchitis ([place])    "get it q yr" (05/13/2013)  . Chronic pain   . Depression   . Edema of lower extremity   . Endocarditis 2002   subacute bacterial endocarditis.   . Family history of anesthesia complication    "my mom has a hard time coming out from under"  . Fibromyalgia   . GERD (gastroesophageal reflux disease)    occ  . Heart murmur   . History of blood transfusion    "just low blood count" (05/13/2013)  . Hyperlipidemia   . Hypertension   . Hypothyroidism   . Hypothyroidism, adult 03/21/2014  .  Leukocytosis   . Necrosis ([place])    and ulceration  . Necrotizing fasciitis s/p OR debridements 07/06/2012  . Obesity   . OSA on CPAP    does not wear CPAP  . Peripheral neuropathy   . Type II diabetes mellitus (HCC)    Type  II     FAMILY HISTORY   Family History  Problem Relation Age of Onset  . Heart failure Mother   . Diabetes Mother   . Kidney disease Mother   . Kidney disease Father   . Diabetes Father   . Diabetes Paternal Grandmother   . Heart failure Paternal Grandmother   . Other Other     SOCIAL HISTORY:   Social History   Socioeconomic History  . Marital status: Single    Spouse name: Not on file  . Number of children: 0  . Years of education: 59  . Highest education level: Not on file  Occupational History  . Occupation: disability  Social Needs  . Financial resource strain: Not on file  . Food insecurity    Worry: Not on file    Inability: Not on file  . Transportation needs    Medical: Not on file    Non-medical: Not on file  Tobacco Use  . Smoking status: Current Every Day Smoker    Packs/day:  0.50    Years: 16.00    Pack years: 8.00    Types: Cigarettes  . Smokeless tobacco: Never Used  Substance and Sexual Activity  . Alcohol use: Yes    Alcohol/week: 0.0 standard drinks    Comment: Socially-" monthy  maybe"  . Drug use: No  . Sexual activity: Yes  Lifestyle  . Physical activity    Days per week: Not on file    Minutes per session: Not on file  . Stress: Not on file  Relationships  . Social Herbalist on phone: Not on file    Gets together: Not on file    Attends religious service: Not on file    Active member of club or organization: Not on file    Attends meetings of clubs or organizations: Not on file    Relationship status: Not on file  . Intimate partner violence    Fear of current or ex partner: Not on file    Emotionally abused: Not on file    Physically abused: Not on file    Forced sexual activity: Not on  file  Other Topics Concern  . Not on file  Social History Narrative   Lives with mother currently, goddaughter and uncle in a one story home.     On disability for diabetes, neuropathy, sleep apnea etc.  Disability since 2014.    Education: 11th grade.     ALLERGIES:    Allergies  Allergen Reactions  . Vancomycin Other (See Comments)    Acute renal failure suspected secondary to vanco    CURRENT MEDICATIONS:    Current Outpatient Medications  Medication Sig Dispense Refill  . acetaminophen (TYLENOL) 325 MG tablet Take 1-2 tablets (325-650 mg total) by mouth every 4 (four) hours as needed for mild pain.    Marland Kitchen amitriptyline (ELAVIL) 50 MG tablet Take 50 mg by mouth at bedtime.    Marland Kitchen amLODipine (NORVASC) 10 MG tablet Take 1 tablet (10 mg total) by mouth daily. 90 tablet 3  . atorvastatin (LIPITOR) 20 MG tablet Take 1 tablet (20 mg total) by mouth daily. 90 tablet 3  . blood glucose meter kit and supplies KIT ICD 10- E11.69. Based on patient and insurance preference. Use up to four times daily as directed. 1 each 0  . bumetanide (BUMEX) 1 MG tablet Take 1 tablet (1 mg total) by mouth 2 (two) times daily. 60 tablet 0  . dextromethorphan-guaiFENesin (MUCINEX DM) 30-600 MG 12hr tablet Take 1 tablet by mouth 2 (two) times daily as needed for cough.    Marland Kitchen FLUoxetine (PROZAC) 20 MG/5ML solution Take 15 mLs (60 mg total) by mouth daily. 1350 mL 3  . hydrOXYzine (ATARAX/VISTARIL) 25 MG tablet Take 1 tablet (25 mg total) by mouth every 6 (six) hours as needed for anxiety. 40 tablet 0  . insulin aspart (NOVOLOG) 100 UNIT/ML FlexPen Inject 14 Units into the skin 3 (three) times daily with meals. (Patient taking differently: Inject 10 Units into the skin 3 (three) times daily with meals. ) 15 mL 6  . iron polysaccharides (NIFEREX) 150 MG capsule Take 1 capsule (150 mg total) by mouth 2 (two) times daily before lunch and supper. 180 capsule 3  . lidocaine (LIDODERM) 5 % Place 1 patch onto the skin  daily. To left knee at 7 am and remove at 7 pm 30 patch 0  . liraglutide (VICTOZA) 18 MG/3ML SOPN Inject 0.58ms (1.84mtotal) into the skin daily 9 mL 3  .  lisinopril (PRINIVIL,ZESTRIL) 10 MG tablet Take 1 tablet (10 mg total) by mouth daily. 90 tablet 2  . Menthol-Methyl Salicylate (MUSCLE RUB) 10-15 % CREA Apply 1 application topically 4 (four) times daily as needed for muscle pain.  0  . metoprolol tartrate (LOPRESSOR) 25 MG tablet Take 0.5 tablets (12.5 mg total) by mouth 2 (two) times daily. 90 tablet 3  . mupirocin ointment (BACTROBAN) 2 % Apply 1 application to affected area 2 times daily 22 g 6  . NYSTATIN powder Apply topically 4 times daily. After cleaning and drying the affected area for two weeks 30 g 1  . polyethylene glycol (MIRALAX / GLYCOLAX) packet Take 17 g by mouth daily. (Patient taking differently: Take 17 g by mouth daily as needed for moderate constipation. ) 14 each 0  . pregabalin (LYRICA) 50 MG capsule Take 1 capsule (50 mg total) by mouth 2 (two) times daily. 180 capsule 1  . senna-docusate (SENOKOT-S) 8.6-50 MG tablet Take 2 tablets by mouth 2 (two) times daily. 120 tablet 0  . simethicone (MYLICON) 40 AC/1.6SA drops Take 1.2 mLs (80 mg total) by mouth 4 (four) times daily. 30 mL 0  . aluminum chloride (DRYSOL) 20 % external solution Apply topically at bedtime. (Patient not taking: Reported on 02/13/2019) 35 mL 0  . diclofenac sodium (VOLTAREN) 1 % GEL Apply 4 grams to affected area 4 times daily as needed for pain (Patient not taking: Reported on 02/13/2019) 100 g 2   No current facility-administered medications for this visit.     REVIEW OF SYSTEMS:   _0  denotes positive finding, _1  denotes negative finding Cardiac  Comments:  Chest pain or chest pressure:    Shortness of breath upon exertion:    Short of breath when lying flat:    Irregular heart rhythm:        Vascular    Pain in calf, thigh, or hip brought on by ambulation: x   Pain in feet at night that  wakes you up from your sleep:  x   Blood clot in your veins:    Leg swelling:  x       Pulmonary    Oxygen at home:    Productive cough:  x   Wheezing:         Neurologic    Sudden weakness in arms or legs:     Sudden numbness in arms or legs:  x   Sudden onset of difficulty speaking or slurred speech:    Temporary loss of vision in one eye:     Problems with dizziness:  x       Gastrointestinal    Blood in stool:      Vomited blood:         Genitourinary    Burning when urinating:     Blood in urine:        Psychiatric    Major depression:         Hematologic    Bleeding problems:    Problems with blood clotting too easily:        Skin    Rashes or ulcers:        Constitutional    Fever or chills:     PHYSICAL EXAM:   Vitals:   02/25/19 0920  BP: (!) 182/106  Pulse: 85  Resp: 20  Temp: 97.9 F (36.6 C)  SpO2: 97%  Weight: 287 lb 14.4 oz (130.6 kg)  Height: _2  (1.778 m)  GENERAL: The patient is a well-nourished female, in no acute distress. The vital signs are documented above. CARDIAC: There is a regular rate and rhythm.  VASCULAR: I had difficulty palpating left femoral pulse however there was a biphasic signal with Doppler PULMONARY: Nonlabored respirations ABDOMEN: Soft and non-tender with normal pitched bowel sounds.  MUSCULOSKELETAL: Drainage from her below-knee amputation site on the left however no obvious skin defect was appreciated NEUROLOGIC: No focal weakness or paresthesias are detected. SKIN: There are no ulcers or rashes noted. PSYCHIATRIC: The patient has a normal affect.  STUDIES:   I have reviewed the following: +-------+-----------+-----------+------------+------------+ ABI/TBIToday's ABIToday's TBIPrevious ABIPrevious TBI +-------+-----------+-----------+------------+------------+ Right  [place]                                           +-------+-----------+-----------+------------+------------+ Left   BKA                                             +-------+-----------+-----------+------------+------------+ Right Toe:  255 ASSESSMENT and PLAN   Recurrent infection in left below-knee amputation stump: The patient has had several hospitalizations for cellulitis in her stump.  She has persistent drainage as well.  I suspect she is going to need a more proximal revision.  However given that I had difficulty palpating her femoral pulse I think proceeding with angiogram to evaluate her blood flow to see what her options are as the next step.  I have scheduled this for Tuesday, October 13.  After the arteriogram and the results, I will schedule her an appointment to follow-up with Dr. Ermalinda Memos, MD, [facility] Vascular and Vein Specialists of [facility] [phone] Pager [phone]

## 2019-02-25 NOTE — Progress Notes (Signed)
Vascular and Vein Specialist of [facility]  Patient name: [name] [name] MRN: [id number] DOB: 04-03-1976 Sex: female   REQUESTING PROVIDER:    Shawn Rayburn   REASON FOR CONSULT:    Ischemic foot  HISTORY OF PRESENT ILLNESS:   [name] [name] is a 43 y.o. female, who is status post right below-knee amputation by Dr. Sharol Given several years ago for infection.  She was recently discharged from the hospital with cellulitis of her stump.  She states that she has had persistent drainage from her stump since it was performed and this is also been revised.  She does walk with a prosthesis.  Patient has a history of acute renal failure.  She is treated for depression.  She is on CPAP for obstructive sleep apnea.  She has a type 2 diabetes.  She takes a statin for hypercholesterolemia and is on ACE inhibitor for hypertension.  PAST MEDICAL HISTORY    Past Medical History:  Diagnosis Date  . Abdominal muscle pain 09/08/2016  . Abnormal Pap smear of cervix 2009  . Abscess    history of multiple abscesses  . Acute bilateral low back pain 02/13/2017  . Acute blood loss anemia   . Acute renal failure ([place]) 07/12/2012  . AKI (acute kidney injury) ([place])   . Anemia of chronic disease 2002  . Anxiety    Panic attacks  . Bilateral lower extremity edema 05/13/2016  . Bipolar disorder ([place])   . Cellulitis 05/21/2014   right eye  . Chronic bronchitis ([place])    "get it q yr" (05/13/2013)  . Chronic pain   . Depression   . Edema of lower extremity   . Endocarditis 2002   subacute bacterial endocarditis.   . Family history of anesthesia complication    "my mom has a hard time coming out from under"  . Fibromyalgia   . GERD (gastroesophageal reflux disease)    occ  . Heart murmur   . History of blood transfusion    "just low blood count" (05/13/2013)  . Hyperlipidemia   . Hypertension   . Hypothyroidism   . Hypothyroidism, adult 03/21/2014  .  Leukocytosis   . Necrosis ([place])    and ulceration  . Necrotizing fasciitis s/p OR debridements 07/06/2012  . Obesity   . OSA on CPAP    does not wear CPAP  . Peripheral neuropathy   . Type II diabetes mellitus (HCC)    Type  II     FAMILY HISTORY   Family History  Problem Relation Age of Onset  . Heart failure Mother   . Diabetes Mother   . Kidney disease Mother   . Kidney disease Father   . Diabetes Father   . Diabetes Paternal Grandmother   . Heart failure Paternal Grandmother   . Other Other     SOCIAL HISTORY:   Social History   Socioeconomic History  . Marital status: Single    Spouse name: Not on file  . Number of children: 0  . Years of education: 59  . Highest education level: Not on file  Occupational History  . Occupation: disability  Social Needs  . Financial resource strain: Not on file  . Food insecurity    Worry: Not on file    Inability: Not on file  . Transportation needs    Medical: Not on file    Non-medical: Not on file  Tobacco Use  . Smoking status: Current Every Day Smoker    Packs/day:  0.50    Years: 16.00    Pack years: 8.00    Types: Cigarettes  . Smokeless tobacco: Never Used  Substance and Sexual Activity  . Alcohol use: Yes    Alcohol/week: 0.0 standard drinks    Comment: Socially-" monthy  maybe"  . Drug use: No  . Sexual activity: Yes  Lifestyle  . Physical activity    Days per week: Not on file    Minutes per session: Not on file  . Stress: Not on file  Relationships  . Social Herbalist on phone: Not on file    Gets together: Not on file    Attends religious service: Not on file    Active member of club or organization: Not on file    Attends meetings of clubs or organizations: Not on file    Relationship status: Not on file  . Intimate partner violence    Fear of current or ex partner: Not on file    Emotionally abused: Not on file    Physically abused: Not on file    Forced sexual activity: Not on  file  Other Topics Concern  . Not on file  Social History Narrative   Lives with mother currently, goddaughter and uncle in a one story home.     On disability for diabetes, neuropathy, sleep apnea etc.  Disability since 2014.    Education: 11th grade.     ALLERGIES:    Allergies  Allergen Reactions  . Vancomycin Other (See Comments)    Acute renal failure suspected secondary to vanco    CURRENT MEDICATIONS:    Current Outpatient Medications  Medication Sig Dispense Refill  . acetaminophen (TYLENOL) 325 MG tablet Take 1-2 tablets (325-650 mg total) by mouth every 4 (four) hours as needed for mild pain.    Marland Kitchen amitriptyline (ELAVIL) 50 MG tablet Take 50 mg by mouth at bedtime.    Marland Kitchen amLODipine (NORVASC) 10 MG tablet Take 1 tablet (10 mg total) by mouth daily. 90 tablet 3  . atorvastatin (LIPITOR) 20 MG tablet Take 1 tablet (20 mg total) by mouth daily. 90 tablet 3  . blood glucose meter kit and supplies KIT ICD 10- E11.69. Based on patient and insurance preference. Use up to four times daily as directed. 1 each 0  . bumetanide (BUMEX) 1 MG tablet Take 1 tablet (1 mg total) by mouth 2 (two) times daily. 60 tablet 0  . dextromethorphan-guaiFENesin (MUCINEX DM) 30-600 MG 12hr tablet Take 1 tablet by mouth 2 (two) times daily as needed for cough.    Marland Kitchen FLUoxetine (PROZAC) 20 MG/5ML solution Take 15 mLs (60 mg total) by mouth daily. 1350 mL 3  . hydrOXYzine (ATARAX/VISTARIL) 25 MG tablet Take 1 tablet (25 mg total) by mouth every 6 (six) hours as needed for anxiety. 40 tablet 0  . insulin aspart (NOVOLOG) 100 UNIT/ML FlexPen Inject 14 Units into the skin 3 (three) times daily with meals. (Patient taking differently: Inject 10 Units into the skin 3 (three) times daily with meals. ) 15 mL 6  . iron polysaccharides (NIFEREX) 150 MG capsule Take 1 capsule (150 mg total) by mouth 2 (two) times daily before lunch and supper. 180 capsule 3  . lidocaine (LIDODERM) 5 % Place 1 patch onto the skin  daily. To left knee at 7 am and remove at 7 pm 30 patch 0  . liraglutide (VICTOZA) 18 MG/3ML SOPN Inject 0.58ms (1.84mtotal) into the skin daily 9 mL 3  .  lisinopril (PRINIVIL,ZESTRIL) 10 MG tablet Take 1 tablet (10 mg total) by mouth daily. 90 tablet 2  . Menthol-Methyl Salicylate (MUSCLE RUB) 10-15 % CREA Apply 1 application topically 4 (four) times daily as needed for muscle pain.  0  . metoprolol tartrate (LOPRESSOR) 25 MG tablet Take 0.5 tablets (12.5 mg total) by mouth 2 (two) times daily. 90 tablet 3  . mupirocin ointment (BACTROBAN) 2 % Apply 1 application to affected area 2 times daily 22 g 6  . NYSTATIN powder Apply topically 4 times daily. After cleaning and drying the affected area for two weeks 30 g 1  . polyethylene glycol (MIRALAX / GLYCOLAX) packet Take 17 g by mouth daily. (Patient taking differently: Take 17 g by mouth daily as needed for moderate constipation. ) 14 each 0  . pregabalin (LYRICA) 50 MG capsule Take 1 capsule (50 mg total) by mouth 2 (two) times daily. 180 capsule 1  . senna-docusate (SENOKOT-S) 8.6-50 MG tablet Take 2 tablets by mouth 2 (two) times daily. 120 tablet 0  . simethicone (MYLICON) 40 AC/1.6SA drops Take 1.2 mLs (80 mg total) by mouth 4 (four) times daily. 30 mL 0  . aluminum chloride (DRYSOL) 20 % external solution Apply topically at bedtime. (Patient not taking: Reported on 02/13/2019) 35 mL 0  . diclofenac sodium (VOLTAREN) 1 % GEL Apply 4 grams to affected area 4 times daily as needed for pain (Patient not taking: Reported on 02/13/2019) 100 g 2   No current facility-administered medications for this visit.     REVIEW OF SYSTEMS:   _0  denotes positive finding, _1  denotes negative finding Cardiac  Comments:  Chest pain or chest pressure:    Shortness of breath upon exertion:    Short of breath when lying flat:    Irregular heart rhythm:        Vascular    Pain in calf, thigh, or hip brought on by ambulation: x   Pain in feet at night that  wakes you up from your sleep:  x   Blood clot in your veins:    Leg swelling:  x       Pulmonary    Oxygen at home:    Productive cough:  x   Wheezing:         Neurologic    Sudden weakness in arms or legs:     Sudden numbness in arms or legs:  x   Sudden onset of difficulty speaking or slurred speech:    Temporary loss of vision in one eye:     Problems with dizziness:  x       Gastrointestinal    Blood in stool:      Vomited blood:         Genitourinary    Burning when urinating:     Blood in urine:        Psychiatric    Major depression:         Hematologic    Bleeding problems:    Problems with blood clotting too easily:        Skin    Rashes or ulcers:        Constitutional    Fever or chills:     PHYSICAL EXAM:   Vitals:   02/25/19 0920  BP: (!) 182/106  Pulse: 85  Resp: 20  Temp: 97.9 F (36.6 C)  SpO2: 97%  Weight: 287 lb 14.4 oz (130.6 kg)  Height: _2  (1.778 m)  GENERAL: The patient is a well-nourished female, in no acute distress. The vital signs are documented above. CARDIAC: There is a regular rate and rhythm.  VASCULAR: I had difficulty palpating left femoral pulse however there was a biphasic signal with Doppler PULMONARY: Nonlabored respirations ABDOMEN: Soft and non-tender with normal pitched bowel sounds.  MUSCULOSKELETAL: Drainage from her below-knee amputation site on the left however no obvious skin defect was appreciated NEUROLOGIC: No focal weakness or paresthesias are detected. SKIN: There are no ulcers or rashes noted. PSYCHIATRIC: The patient has a normal affect.  STUDIES:   I have reviewed the following: +-------+-----------+-----------+------------+------------+ ABI/TBIToday's ABIToday's TBIPrevious ABIPrevious TBI +-------+-----------+-----------+------------+------------+ Right  [place]         [place]                                  +-------+-----------+-----------+------------+------------+ Left   BKA                                             +-------+-----------+-----------+------------+------------+ Right Toe:  255 ASSESSMENT and PLAN   Recurrent infection in left below-knee amputation stump: The patient has had several hospitalizations for cellulitis in her stump.  She has persistent drainage as well.  I suspect she is going to need a more proximal revision.  However given that I had difficulty palpating her femoral pulse I think proceeding with angiogram to evaluate her blood flow to see what her options are as the next step.  I have scheduled this for Tuesday, October 13.  After the arteriogram and the results, I will schedule her an appointment to follow-up with Dr. Ermalinda Memos, MD, [facility] Specialists of [facility] [phone] Pager [phone]

## 2019-02-28 ENCOUNTER — Encounter: Payer: Medicare Other | Admitting: Internal Medicine

## 2019-02-28 ENCOUNTER — Ambulatory Visit (INDEPENDENT_AMBULATORY_CARE_PROVIDER_SITE_OTHER): Payer: Medicare Other | Admitting: Internal Medicine

## 2019-02-28 ENCOUNTER — Other Ambulatory Visit: Payer: Self-pay

## 2019-02-28 ENCOUNTER — Encounter: Payer: Self-pay | Admitting: Internal Medicine

## 2019-02-28 VITALS — BP 127/68 | HR 88 | Temp 98.8°F | Ht 69.5 in | Wt 289.6 lb

## 2019-02-28 DIAGNOSIS — Z23 Encounter for immunization: Secondary | ICD-10-CM | POA: Diagnosis not present

## 2019-02-28 DIAGNOSIS — E1159 Type 2 diabetes mellitus with other circulatory complications: Secondary | ICD-10-CM

## 2019-02-28 DIAGNOSIS — Z720 Tobacco use: Secondary | ICD-10-CM

## 2019-02-28 DIAGNOSIS — I10 Essential (primary) hypertension: Secondary | ICD-10-CM | POA: Diagnosis not present

## 2019-02-28 LAB — GLUCOSE, CAPILLARY: Glucose-Capillary: 173 mg/dL — ABNORMAL HIGH (ref 70–99)

## 2019-02-28 LAB — POCT GLYCOSYLATED HEMOGLOBIN (HGB A1C): Hemoglobin A1C: 6.6 % — AB (ref 4.0–5.6)

## 2019-02-28 MED ORDER — VARENICLINE TARTRATE 0.5 MG X 11 & 1 MG X 42 PO MISC: ORAL | 0 refills | Status: DC

## 2019-02-28 NOTE — Progress Notes (Signed)
   CC: DM, HTN, tobacco use  HPI:  [name] is a 43 y.o. female with PMHx listed below presenting for DM, HTN, tobacco use. Please see the A&P for the status of the patient's chronic medical problems.  Past Medical History:  Diagnosis Date  . Abdominal muscle pain 09/08/2016  . Abnormal Pap smear of cervix 2009  . Abscess    history of multiple abscesses  . Acute bilateral low back pain 02/13/2017  . Acute blood loss anemia   . Acute renal failure ([place]) 07/12/2012  . AKI (acute kidney injury) ([place])   . Anemia of chronic disease 2002  . Anxiety    Panic attacks  . Bilateral lower extremity edema 05/13/2016  . Bipolar disorder ([place])   . Cellulitis 05/21/2014   right eye  . Chronic bronchitis ([place])    "get it q yr" (05/13/2013)  . Chronic pain   . Depression   . Edema of lower extremity   . Endocarditis 2002   subacute bacterial endocarditis.   . Family history of anesthesia complication    "my mom has a hard time coming out from under"  . Fibromyalgia   . GERD (gastroesophageal reflux disease)    occ  . Heart murmur   . History of blood transfusion    "just low blood count" (05/13/2013)  . Hyperlipidemia   . Hypertension   . Hypothyroidism   . Hypothyroidism, adult 03/21/2014  . Leukocytosis   . Necrosis ([place])    and ulceration  . Necrotizing fasciitis s/p OR debridements 07/06/2012  . Obesity   . OSA on CPAP    does not wear CPAP  . Peripheral neuropathy   . Type II diabetes mellitus ([place])    Type  II   Review of Systems:  Performed and all others negative.  Physical Exam: Vitals:   02/28/19 1403  BP: 127/68  Pulse: 88  Temp: 98.8 F (37.1 C)  TempSrc: Oral  SpO2: 99%  Weight: 289 lb 9.6 oz (131.4 kg)  Height: 5' 9.5" (1.765 m)   General: Well nourished female in no acute distress Pulm: Good air movement with no wheezing or crackles  CV: RRR, no murmurs, no rubs   Assessment & Plan:   See Encounters Tab for problem based charting.   Patient discussed with Dr. Dareen Piano

## 2019-02-28 NOTE — Patient Instructions (Signed)
Thank you for allowing me to provide your care. You are doing a great job with the weight loss and controlling your DM. Keep up the good work!  1) We are going to switch you to a combination insulin. Dr. Maudie Mercury will be reaching out to you to discuss how to transition to this medication.   2) We are starting a medication call Chantix to help you stop smoking. This will be great for wound healing and your health.   I would like to see you back in 3 months or sooner if any issues arise.

## 2019-03-01 ENCOUNTER — Encounter: Payer: Self-pay | Admitting: Internal Medicine

## 2019-03-01 ENCOUNTER — Other Ambulatory Visit (HOSPITAL_COMMUNITY)
Admission: RE | Admit: 2019-03-01 | Discharge: 2019-03-01 | Disposition: A | Discharge status: Home / Self Care | Payer: Medicare Other | Source: Ambulatory Visit | Attending: Surgery | Admitting: Surgery

## 2019-03-01 ENCOUNTER — Ambulatory Visit (INDEPENDENT_AMBULATORY_CARE_PROVIDER_SITE_OTHER): Payer: Medicare Other | Admitting: Family

## 2019-03-01 ENCOUNTER — Other Ambulatory Visit: Payer: Self-pay

## 2019-03-01 ENCOUNTER — Encounter: Payer: Self-pay | Admitting: Family

## 2019-03-01 VITALS — Ht 69.5 in | Wt 289.6 lb

## 2019-03-01 DIAGNOSIS — Z89512 Acquired absence of left leg below knee: Secondary | ICD-10-CM | POA: Diagnosis not present

## 2019-03-01 DIAGNOSIS — Z01812 Encounter for preprocedural laboratory examination: Secondary | ICD-10-CM | POA: Insufficient documentation

## 2019-03-01 DIAGNOSIS — Z20828 Contact with and (suspected) exposure to other viral communicable diseases: Secondary | ICD-10-CM | POA: Insufficient documentation

## 2019-03-01 DIAGNOSIS — I890 Lymphedema, not elsewhere classified: Secondary | ICD-10-CM | POA: Diagnosis not present

## 2019-03-01 DIAGNOSIS — Z720 Tobacco use: Secondary | ICD-10-CM | POA: Insufficient documentation

## 2019-03-01 NOTE — Assessment & Plan Note (Signed)
HPI:  Patient currently smokes one pack per day. She states that her father recently moved in with her and she is starting to smoke more. She is interested in quitting. She has had a difficult time getting her left amputation site to heal.  A/P: - Discussed the benefits of smoking specifically on wound healing. - Discussed pharmacotherapy including nicotine patches, nicotine gum, and Chantix. She would like to use Chantix. I have sent out a pack.

## 2019-03-01 NOTE — Progress Notes (Signed)
Office Visit Note   Patient: [name] [name]           Date of Birth: [birth year]           MRN: [id number] Visit Date: 03/01/2019              Requested by: Ina Homes, MD [address] [place],  [place] [address] PCP: Ina Homes, MD  Chief Complaint  Patient presents with   Left Leg - Follow-up    06/09/2017 left BKA revision      HPI: The patient is a 43 year old woman who presents today for evaluation of her left below the knee amputation.  She has been having significant issues with what she describes as leakage from her residual limb denies any open wounds.  She has been following with cardiology as well.  Cardiology has set her up with a vein and vascular consult for later this month.  She is concerned that she may have infection wonders if a revision would help her with her "" leakage."  She is following with [facility] for her prosthetic needs his not had her current socket very long.  However she is using about 19 ply daily does not have much fluctuation in her limb volume over the course of the day.  She has tenderness to the end of her residual limb.  Assessment & Plan: Visit Diagnoses:  1. Status post below knee amputation, left ([place])   2. Lymphedema of left lower extremity     Plan: She will continue following with cardiology complete her vascular consult.  Wonder if she is not getting enough distal contact with her socket we will send her back to [facility] for evaluation possible modifications.  Discussed return precautions.  Discussed the fact that she does not currently have any wounds any infection there is no need for revision of her amputation at this time  In regards to her chronic knee pain she has had a recent MRI earlier this month during hospitalization for cellulitis of her left residual limb has significant degenerative changes as well as meniscal tear.  Will discuss possibility of surgical intervention for her knee with Dr. Sharol Given.  Follow-Up  Instructions: No follow-ups on file.   Ortho Exam  Patient is alert, oriented, no adenopathy, well-dressed, normal affect, normal respiratory effort. On examination of the left residual limb this is well-healed well consolidated however she does have some callus to her distal residual limb changes consistent with lymphedema to her skin.  There is some tenderness to the distal tip as well there is no active drainage at this point however there are a few areas that appear macerated.  There is no open ulceration no erythema no warmth  Imaging: No results found. No images are attached to the encounter.  Labs: Lab Results  Component Value Date   HGBA1C 6.6 (A) 02/28/2019   HGBA1C 7.0 (A) 11/08/2018   HGBA1C 6.5 (A) 06/28/2018   ESRSEDRATE 130 (H) 03/24/2017   ESRSEDRATE 53 (H) 09/14/2015   ESRSEDRATE 132 (H) 07/06/2012   CRP 13.6 (H) 02/14/2019   CRP 38.6 (H) 04/03/2018   CRP 5.4 (H) 09/14/2015   REPTSTATUS 02/17/2019 FINAL 02/12/2019   REPTSTATUS 02/17/2019 FINAL 02/12/2019   GRAMSTAIN  08/11/2017    MODERATE WBC PRESENT, PREDOMINANTLY PMN FEW GRAM POSITIVE COCCI    CULT  02/12/2019    NO GROWTH 5 DAYS Performed at [facility], [place] [address], [place], [place] [address]    CULT  02/12/2019    NO GROWTH 5 DAYS Performed at [facility], [place] [address], [place], [place] [address]    LABORGA GROUP B STREP(S.AGALACTIAE)ISOLATED 04/03/2018     Lab Results  Component Value Date   ALBUMIN 2.1 (L) 02/12/2019   ALBUMIN 2.8 (L) 12/18/2018   ALBUMIN <1.0 (L) 04/11/2018    Lab Results  Component Value Date   MG 2.3 04/07/2018   MG 2.1 04/05/2018   MG 2.4 03/25/2017   No results found for: VD25OH  No results found for: PREALBUMIN CBC EXTENDED Latest Ref Rng & Units 02/15/2019 02/14/2019 02/13/2019  WBC 4.0 - 10.5 K/uL 4.9 5.7 11.7(H)  RBC 3.87 - 5.11 MIL/uL 3.24(L) 3.25(L) 3.51(L)  HGB 12.0 - 15.0 g/dL 8.7(L) 8.9(L) 9.6(L)  HCT 36.0 - 46.0 % 28.4(L) 27.8(L)  30.4(L)  PLT 150 - 400 K/uL 229 232 261  NEUTROABS 1.7 - 7.7 K/uL - - -  LYMPHSABS 0.7 - 4.0 K/uL - - -     Body mass index is 42.15 kg/m.  Orders:  No orders of the defined types were placed in this encounter.  No orders of the defined types were placed in this encounter.    Procedures: No procedures performed  Clinical Data: No additional findings.  ROS:  All other systems negative, except as noted in the HPI. Review of Systems  Constitutional: Negative for chills and fever.  Cardiovascular: Positive for leg swelling.  Musculoskeletal: Positive for arthralgias.  Skin: Positive for color change and wound.    Objective: Vital Signs: Ht 5' 9.5" (1.765 m)    Wt 289 lb 9.6 oz (131.4 kg)    BMI 42.15 kg/m   Specialty Comments:  No specialty comments available.  PMFS History: Patient Active Problem List   Diagnosis Date Noted   Tobacco use 03/01/2019   Phantom limb pain ([place]) 11/08/2018   Current moderate episode of major depressive disorder (HCC)    Anemia of chronic disease    Severe protein-calorie malnutrition (HCC)    Herpes simplex type 1 infection 01/16/2018   Status post below knee amputation, left ([place]) 04/11/2017   Bipolar affective disorder ([place])    Adjustment disorder with depressed mood 03/26/2017   Heart failure with preserved ejection fraction ([place]) 02/09/2017   Routine adult health maintenance 12/08/2016   Chronic pain of right knee 09/08/2016   Chronic kidney disease (CKD), stage III (moderate) 07/15/2016   OSA (obstructive sleep apnea) 05/13/2016   Morbid obesity due to excess calories ([place]) 11/27/2015   Hypothyroidism 04/25/2013   Controlled type 2 diabetes mellitus ([place]) 07/12/2012   Carpal tunnel syndrome, bilateral 01/25/2012   Diabetic peripheral neuropathy ([place]) 07/04/2011   Anxiety and depression 06/02/2011   GERD 12/06/2007   Hyperlipidemia 08/29/2007   Hypertension associated with diabetes ([place]) 08/29/2007    Past Medical History:  Diagnosis Date   Abdominal muscle pain 09/08/2016   Abnormal Pap smear of cervix 2009   Abscess    history of multiple abscesses   Acute bilateral low back pain 02/13/2017   Acute blood loss anemia    Acute renal failure ([place]) 07/12/2012   AKI (acute kidney injury) ([place])    Anemia of chronic disease 2002   Anxiety    Panic attacks   Bilateral lower extremity edema 05/13/2016   Bipolar disorder ([place])    Cellulitis 05/21/2014   right eye   Chronic bronchitis ([place])    "get it q yr" (05/13/2013)   Chronic pain    Depression  Edema of lower extremity    Endocarditis 2002   subacute bacterial endocarditis.    Family history of anesthesia complication    "my mom has a hard time coming out from under"   Fibromyalgia    GERD (gastroesophageal reflux disease)    occ   Heart murmur    History of blood transfusion    "just low blood count" (05/13/2013)   Hyperlipidemia    Hypertension    Hypothyroidism    Hypothyroidism, adult 03/21/2014   Leukocytosis    Necrosis ([place])    and ulceration   Necrotizing fasciitis s/p OR debridements 07/06/2012   Obesity    OSA on CPAP    does not wear CPAP   Peripheral neuropathy    Type II diabetes mellitus ([place])    Type  II    Family History  Problem Relation Age of Onset   Heart failure Mother    Diabetes Mother    Kidney disease Mother    Kidney disease Father    Diabetes Father    Diabetes Paternal Grandmother    Heart failure Paternal Grandmother    Other Other     Past Surgical History:  Procedure Laterality Date   AMPUTATION Left 04/07/2017   Procedure: LEFT BELOW KNEE AMPUTATION;  Surgeon: Newt Minion, MD;  Location: [facility];  Service: Orthopedics;  Laterality: Left;   EYE SURGERY     l[place]   INCISION AND DRAINAGE ABSCESS     multiple I&Ds   INCISION AND DRAINAGE ABSCESS Left 07/09/2012   Procedure: DRESSING CHANGE, THIGH WOUND;  Surgeon: Harl Bowie, MD;  Location: [facility];  Service: General;  Laterality: Left;   INCISION AND DRAINAGE OF WOUND Left 07/07/2012   Procedure: IRRIGATION AND DEBRIDEMENT WOUND;  Surgeon: Harl Bowie, MD;  Location: [facility];  Service: General;  Laterality: Left;   INCISION AND DRAINAGE PERIRECTAL ABSCESS Left 07/14/2012   Procedure: DEBRIDEMENT OF SKIN & SOFT TISSUE; DRESSING CHANGE UNDER ANESTHESIA;  Surgeon: Gayland Curry, MD,FACS;  Location: [facility];  Service: General;  Laterality: Left;   INCISION AND DRAINAGE PERIRECTAL ABSCESS Left 07/16/2012   Procedure: I&D Left Thigh;  Surgeon: Gwenyth Ober, MD;  Location: [facility];  Service: General;  Laterality: Left;   INCISION AND DRAINAGE PERIRECTAL ABSCESS N/A 01/05/2015   Procedure: IRRIGATION AND DEBRIDEMENT PERIRECTAL ABSCESS;  Surgeon: Donnie Mesa, MD;  Location: [facility];  Service: General;  Laterality: N/A;   IRRIGATION AND DEBRIDEMENT ABSCESS Left 07/06/2012   Procedure: IRRIGATION AND DEBRIDEMENT ABSCESS BUTTOCKS AND THIGH;  Surgeon: Shann Medal, MD;  Location: [facility];  Service: General;  Laterality: Left;   IRRIGATION AND DEBRIDEMENT ABSCESS Left 08/10/2012   Procedure: IRRIGATION AND DEBRIDEMENT ABSCESS;  Surgeon: Madilyn Hook, DO;  Location: [facility];  Service: General;  Laterality: Left;   STUMP REVISION Left 06/09/2017   Procedure: REVISION LEFT BELOW KNEE AMPUTATION;  Surgeon: Newt Minion, MD;  Location: [facility];  Service: Orthopedics;  Laterality: Left;   TONSILLECTOMY  1994   Social History   Occupational History   Occupation: disability  Tobacco Use   Smoking status: Current Every Day Smoker    Packs/day: 0.50    Years: 16.00    Pack years: 8.00    Types: Cigarettes   Smokeless tobacco: Never Used   Tobacco comment: 1 PPD  Substance and Sexual Activity   Alcohol use: Yes    Alcohol/week: 0.0 standard drinks    Comment: Socially-" monthy  maybe"   Drug use: No   Sexual activity: Yes

## 2019-03-01 NOTE — Progress Notes (Signed)
Internal Medicine Clinic Attending  Case discussed with Dr. Helberg at the time of the visit.  We reviewed the resident's history and exam and pertinent patient test results.  I agree with the assessment, diagnosis, and plan of care documented in the resident's note.    

## 2019-03-01 NOTE — Assessment & Plan Note (Signed)
HPI:  Patient with diabetes. She is currently on NovoLog 14 units TID with meals and Victoza 1.8 units daily. She is tolerating both these medications well without any apparent side effects. She continues to watch her diet and maintain the weight loss that she has sustained this year. Overall she feels like she is doing a much better job with her diabetes management. She is not been exercising as there continues to be issues with the way her left prosthetic fits.  A/P: - Well controlled.  - Discussed transitioning to insulin/GLP-1 combination. The patient agrees. Will contact Dr. Maudie Mercury for help making this adjustment. - Continue Lisinopril  - Continue Atorvastatin

## 2019-03-01 NOTE — Assessment & Plan Note (Signed)
HPI:  Patient with hypertension currently on amlodipine 10 mg daily, metoprolol tartrate 12.5 mg twice daily, and lisinopril 10 mg daily. She is tolerating both these medications well without any apparent side effects. No issues with affording these medications. She denies orthostatic symptoms.  A/P: - Well controlled. Continue amlodipine 10 mg daily, metoprolol tartrate 12.5 mg twice daily, and lisinopril 10 mg daily.  - Last BMP with stable GFR and normal potassium

## 2019-03-04 ENCOUNTER — Telehealth: Payer: Self-pay | Admitting: Internal Medicine

## 2019-03-04 DIAGNOSIS — E1159 Type 2 diabetes mellitus with other circulatory complications: Secondary | ICD-10-CM

## 2019-03-04 LAB — NOVEL CORONAVIRUS, NAA (HOSP ORDER, SEND-OUT TO REF LAB; TAT 18-24 HRS): SARS-CoV-2, NAA: NOT DETECTED

## 2019-03-04 MED ORDER — XULTOPHY 100-3.6 UNIT-MG/ML ~~LOC~~ SOPN: 30.0000 [IU] | PEN_INJECTOR | Freq: Every day | SUBCUTANEOUS | 0 refills | Status: DC

## 2019-03-04 NOTE — Telephone Encounter (Signed)
Attempted to call patient to discuss transition to [facility]. No answer and voicemail is not set up. Will send out prescription and attempt to contact patient again. She will need to stop her victoza and humolog with the initiation of this medication.   Ina Homes, MD  [facility] PGY3

## 2019-03-05 ENCOUNTER — Ambulatory Visit (HOSPITAL_COMMUNITY)
Admission: RE | Admit: 2019-03-05 | Discharge: 2019-03-05 | Disposition: A | Discharge status: Home / Self Care | Payer: Medicare Other | Attending: Surgery | Admitting: Surgery

## 2019-03-05 ENCOUNTER — Encounter (HOSPITAL_COMMUNITY)
Admission: RE | Disposition: A | Discharge status: Home / Self Care | Payer: Self-pay | Source: Home / Self Care | Attending: Surgery

## 2019-03-05 ENCOUNTER — Telehealth: Payer: Self-pay | Admitting: *Deleted

## 2019-03-05 ENCOUNTER — Other Ambulatory Visit: Payer: Self-pay | Admitting: *Deleted

## 2019-03-05 DIAGNOSIS — Z539 Procedure and treatment not carried out, unspecified reason: Secondary | ICD-10-CM | POA: Diagnosis not present

## 2019-03-05 DIAGNOSIS — I739 Peripheral vascular disease, unspecified: Secondary | ICD-10-CM | POA: Diagnosis not present

## 2019-03-05 LAB — POCT I-STAT, CHEM 8
BUN: 40 mg/dL — ABNORMAL HIGH (ref 6–20)
Calcium, Ion: 1.15 mmol/L (ref 1.15–1.40)
Chloride: 105 mmol/L (ref 98–111)
Creatinine, Ser: 3 mg/dL — ABNORMAL HIGH (ref 0.44–1.00)
Glucose, Bld: 114 mg/dL — ABNORMAL HIGH (ref 70–99)
HCT: 34 % — ABNORMAL LOW (ref 36.0–46.0)
Hemoglobin: 11.6 g/dL — ABNORMAL LOW (ref 12.0–15.0)
Potassium: 4.5 mmol/L (ref 3.5–5.1)
Sodium: 137 mmol/L (ref 135–145)
TCO2: 21 mmol/L — ABNORMAL LOW (ref 22–32)

## 2019-03-05 LAB — PREGNANCY, URINE: Preg Test, Ur: NEGATIVE

## 2019-03-05 SURGERY — LOWER EXTREMITY ANGIOGRAPHY
Anesthesia: LOCAL

## 2019-03-05 MED ORDER — SODIUM CHLORIDE 0.9 % IV SOLN: INTRAVENOUS | Status: DC

## 2019-03-05 NOTE — Telephone Encounter (Signed)
Call to patient. Instructed to go for nasal swab testing at [facility] at 12 noon on 03/15/2019. Report to admitting office at [facility] at 7:00 am on 03/19/2019. NPO past MN except for Metoprolol and Amlodipine with sips of water before leaving for the hospital. Follow insulin adjustment guidelines received at the office on 02/25/2019. Must have a driver and caregiver for discharge. Patient read back instructions and to call this office if questions.

## 2019-03-06 ENCOUNTER — Other Ambulatory Visit: Payer: Self-pay | Admitting: Internal Medicine

## 2019-03-06 DIAGNOSIS — I5032 Chronic diastolic (congestive) heart failure: Secondary | ICD-10-CM

## 2019-03-07 ENCOUNTER — Ambulatory Visit: Payer: Medicare Other | Admitting: Licensed Clinical Social Worker

## 2019-03-07 ENCOUNTER — Telehealth: Payer: Self-pay | Admitting: Licensed Clinical Social Worker

## 2019-03-07 NOTE — Telephone Encounter (Signed)
Pt is requesting a call back. 

## 2019-03-14 ENCOUNTER — Encounter: Payer: Self-pay | Admitting: Internal Medicine

## 2019-03-14 ENCOUNTER — Ambulatory Visit (INDEPENDENT_AMBULATORY_CARE_PROVIDER_SITE_OTHER): Payer: Medicare Other | Admitting: Internal Medicine

## 2019-03-14 ENCOUNTER — Other Ambulatory Visit: Payer: Self-pay

## 2019-03-14 VITALS — BP 171/88 | HR 92 | Temp 98.4°F | Ht 69.5 in | Wt 285.8 lb

## 2019-03-14 DIAGNOSIS — N1832 Chronic kidney disease, stage 3b: Secondary | ICD-10-CM | POA: Diagnosis not present

## 2019-03-14 DIAGNOSIS — E1159 Type 2 diabetes mellitus with other circulatory complications: Secondary | ICD-10-CM

## 2019-03-14 DIAGNOSIS — N179 Acute kidney failure, unspecified: Secondary | ICD-10-CM

## 2019-03-14 DIAGNOSIS — I10 Essential (primary) hypertension: Secondary | ICD-10-CM

## 2019-03-14 LAB — BASIC METABOLIC PANEL
Anion gap: 7 (ref 5–15)
BUN: 22 mg/dL — ABNORMAL HIGH (ref 6–20)
CO2: 26 mmol/L (ref 22–32)
Calcium: 9 mg/dL (ref 8.9–10.3)
Chloride: 108 mmol/L (ref 98–111)
Creatinine, Ser: 1.55 mg/dL — ABNORMAL HIGH (ref 0.44–1.00)
GFR calc Af Amer: 47 mL/min — ABNORMAL LOW (ref >60)
GFR calc non Af Amer: 41 mL/min — ABNORMAL LOW (ref >60)
Glucose, Bld: 115 mg/dL — ABNORMAL HIGH (ref 70–99)
Potassium: 4.8 mmol/L (ref 3.5–5.1)
Sodium: 141 mmol/L (ref 135–145)

## 2019-03-14 NOTE — Progress Notes (Signed)
   CC: Elevated creatinine, CKD, DM, HTN  HPI:  [name] [name] is a 43 y.o. female with PMHx listed below presenting for Elevated creatinine, CKD, DM, HTN. Please see the A&P for the status of the patient's chronic medical problems.  Past Medical History:  Diagnosis Date  . Abdominal muscle pain 09/08/2016  . Abnormal Pap smear of cervix 2009  . Abscess    history of multiple abscesses  . Acute bilateral low back pain 02/13/2017  . Acute blood loss anemia   . Acute renal failure ([place]) 07/12/2012  . AKI (acute kidney injury) ([place])   . Anemia of chronic disease 2002  . Anxiety    Panic attacks  . Bilateral lower extremity edema 05/13/2016  . Bipolar disorder ([place])   . Cellulitis 05/21/2014   right eye  . Chronic bronchitis ([place])    "get it q yr" (05/13/2013)  . Chronic pain   . Depression   . Edema of lower extremity   . Endocarditis 2002   subacute bacterial endocarditis.   . Family history of anesthesia complication    "my mom has a hard time coming out from under"  . Fibromyalgia   . GERD (gastroesophageal reflux disease)    occ  . Heart murmur   . Herpes simplex type 1 infection 01/16/2018  . History of blood transfusion    "just low blood count" (05/13/2013)  . Hyperlipidemia   . Hypertension   . Hypothyroidism   . Hypothyroidism, adult 03/21/2014  . Leukocytosis   . Necrosis ([place])    and ulceration  . Necrotizing fasciitis s/p OR debridements 07/06/2012  . Obesity   . OSA on CPAP    does not wear CPAP  . Peripheral neuropathy   . Type II diabetes mellitus ([place])    Type  II   Review of Systems:  Performed and all others negative.  Physical Exam: Vitals:   03/14/19 0857  BP: (!) 171/88  Pulse: 92  Temp: 98.4 F (36.9 C)  TempSrc: Oral  SpO2: 100%  Weight: 285 lb 12.8 oz (129.6 kg)  Height: 5' 9.5" (1.765 m)   General: Well nourished female in no acute distress Pulm: Good air movement with no wheezing or crackles  CV: RRR, no murmurs, no  rubs   Assessment & Plan:   See Encounters Tab for problem based charting.  Patient discussed with Dr. Evette Doffing

## 2019-03-14 NOTE — Patient Instructions (Signed)
Thank you for allowing me to provide your care. Continue to hold your lisinopril until I have called you about your blood work. I will refer you on to a kidney doctor to further discuss how we can slow the progression of your kidney disease. I would like to have you back in two weeks after your procedure so we can check some additional blood work.  If you have any questions or concerns please do not hesitate to call me.

## 2019-03-15 ENCOUNTER — Other Ambulatory Visit (HOSPITAL_COMMUNITY)
Admit: 2019-03-15 | Discharge: 2019-03-15 | Disposition: A | Discharge status: Home / Self Care | Payer: Medicare Other | Source: Ambulatory Visit | Attending: Surgery | Admitting: Surgery

## 2019-03-15 ENCOUNTER — Encounter: Payer: Self-pay | Admitting: Internal Medicine

## 2019-03-15 ENCOUNTER — Other Ambulatory Visit (HOSPITAL_COMMUNITY): Payer: Self-pay

## 2019-03-15 DIAGNOSIS — Z20828 Contact with and (suspected) exposure to other viral communicable diseases: Secondary | ICD-10-CM | POA: Insufficient documentation

## 2019-03-15 DIAGNOSIS — Z01812 Encounter for preprocedural laboratory examination: Secondary | ICD-10-CM | POA: Diagnosis not present

## 2019-03-15 NOTE — Progress Notes (Signed)
Internal Medicine Clinic Attending  Case discussed with Dr. Helberg at the time of the visit.  We reviewed the resident's history and exam and pertinent patient test results.  I agree with the assessment, diagnosis, and plan of care documented in the resident's note.    

## 2019-03-15 NOTE — Assessment & Plan Note (Signed)
Patient with known CKD stage III. She is currently on lisinopril for renal protection in the setting of her diabetes and hypertension. She has never seen a nephrologist before. She does not have any uremic symptoms.  A/P: - Repeat BMP with a creatinine of 1.55 which is at her baseline. - Long discussion today about CKD and the natural progression of the disease. - Will refer to nephrology

## 2019-03-15 NOTE — Assessment & Plan Note (Signed)
Patient with well-controlled type II diabetes. She was previously on NovoLog 14 units TID with meals and big toes a 1.8 units daily. After her last visit we switched her to Degludec-Liraglutide 30 units QHS. She is tolerating this medication well without any apparent side effects. She did not bring in her glucose monitor today, but states her CBGs have been consistent with prior.  A/P: - Well-controlled - Continue Degludec-Liraglutide 30 units QHS - Continue lisinopril 10 mg QD - Continue atorvastatin 20 mg QD - Although SGLT2 inhibitors would provide further cardio-renal protection given her amputations we will avoid these medications.

## 2019-03-15 NOTE — Assessment & Plan Note (Signed)
Patient with hypertension. Previously on amlodipine 10 mg daily, metoprolol tartrate 12.5 mg twice daily, and lisinopril 10 mg daily. She went for angiography of her bilateral lower extremities on 10/15 and was found to have an elevated creatinine. Her lisinopril was held in she was told to follow-up with me. Since that time she has been holding her lisinopril and this morning she did not take any of her blood pressure medications.  A/P: - Uncontrolled this morning. Due to not taking medications. - Repeat BMP with normalization of her renal function. - Continue amlodipine 10 mg daily, metoprolol tartrate 12.5 mg twice daily, and lisinopril 10 mg daily.

## 2019-03-15 NOTE — Assessment & Plan Note (Signed)
Patient presented to the hospital for angiography on 10/15. I stat illustrated a creatinine of three. This was above her baseline of approximately 1.5-1.7. The procedure was canceled and she was advised to hold her lisinopril, hydrate, and asked to follow-up with me. Since the procedure she has been talking with her family a lot about the procedure and the risk of dialysis. She understands that there is inherent risk doing an angiography. But would like to proceed.   A/P: - Repeat BMP with creatinine of 1.55 which is at her baseline. - Will restart lisinopril 10 mg daily

## 2019-03-18 LAB — NOVEL CORONAVIRUS, NAA (HOSP ORDER, SEND-OUT TO REF LAB; TAT 18-24 HRS): SARS-CoV-2, NAA: NOT DETECTED

## 2019-03-19 ENCOUNTER — Other Ambulatory Visit: Payer: Self-pay

## 2019-03-19 ENCOUNTER — Ambulatory Visit (HOSPITAL_COMMUNITY)
Admission: RE | Admit: 2019-03-19 | Discharge: 2019-03-19 | Disposition: A | Discharge status: Home / Self Care | Payer: Medicare Other | Attending: Surgery | Admitting: Surgery

## 2019-03-19 ENCOUNTER — Encounter (HOSPITAL_COMMUNITY)
Admission: RE | Disposition: A | Discharge status: Home / Self Care | Payer: Self-pay | Source: Home / Self Care | Attending: Surgery

## 2019-03-19 DIAGNOSIS — E7800 Pure hypercholesterolemia, unspecified: Secondary | ICD-10-CM | POA: Diagnosis not present

## 2019-03-19 DIAGNOSIS — F17210 Nicotine dependence, cigarettes, uncomplicated: Secondary | ICD-10-CM | POA: Insufficient documentation

## 2019-03-19 DIAGNOSIS — G4733 Obstructive sleep apnea (adult) (pediatric): Secondary | ICD-10-CM | POA: Insufficient documentation

## 2019-03-19 DIAGNOSIS — T8781 Dehiscence of amputation stump: Secondary | ICD-10-CM | POA: Diagnosis not present

## 2019-03-19 DIAGNOSIS — M797 Fibromyalgia: Secondary | ICD-10-CM | POA: Diagnosis not present

## 2019-03-19 DIAGNOSIS — E785 Hyperlipidemia, unspecified: Secondary | ICD-10-CM | POA: Insufficient documentation

## 2019-03-19 DIAGNOSIS — K219 Gastro-esophageal reflux disease without esophagitis: Secondary | ICD-10-CM | POA: Diagnosis not present

## 2019-03-19 DIAGNOSIS — F329 Major depressive disorder, single episode, unspecified: Secondary | ICD-10-CM | POA: Insufficient documentation

## 2019-03-19 DIAGNOSIS — Y839 Surgical procedure, unspecified as the cause of abnormal reaction of the patient, or of later complication, without mention of misadventure at the time of the procedure: Secondary | ICD-10-CM | POA: Insufficient documentation

## 2019-03-19 DIAGNOSIS — I10 Essential (primary) hypertension: Secondary | ICD-10-CM | POA: Insufficient documentation

## 2019-03-19 DIAGNOSIS — F319 Bipolar disorder, unspecified: Secondary | ICD-10-CM | POA: Insufficient documentation

## 2019-03-19 DIAGNOSIS — E039 Hypothyroidism, unspecified: Secondary | ICD-10-CM | POA: Insufficient documentation

## 2019-03-19 DIAGNOSIS — E1151 Type 2 diabetes mellitus with diabetic peripheral angiopathy without gangrene: Secondary | ICD-10-CM | POA: Insufficient documentation

## 2019-03-19 DIAGNOSIS — T8744 Infection of amputation stump, left lower extremity: Secondary | ICD-10-CM | POA: Diagnosis not present

## 2019-03-19 DIAGNOSIS — Z79899 Other long term (current) drug therapy: Secondary | ICD-10-CM | POA: Diagnosis not present

## 2019-03-19 DIAGNOSIS — Z89512 Acquired absence of left leg below knee: Secondary | ICD-10-CM | POA: Insufficient documentation

## 2019-03-19 DIAGNOSIS — Z6839 Body mass index (BMI) 39.0-39.9, adult: Secondary | ICD-10-CM | POA: Insufficient documentation

## 2019-03-19 DIAGNOSIS — E669 Obesity, unspecified: Secondary | ICD-10-CM | POA: Insufficient documentation

## 2019-03-19 DIAGNOSIS — Z794 Long term (current) use of insulin: Secondary | ICD-10-CM | POA: Insufficient documentation

## 2019-03-19 DIAGNOSIS — L03116 Cellulitis of left lower limb: Secondary | ICD-10-CM | POA: Insufficient documentation

## 2019-03-19 HISTORY — PX: ABDOMINAL AORTOGRAM W/LOWER EXTREMITY: CATH118223

## 2019-03-19 LAB — POCT I-STAT, CHEM 8
BUN: 31 mg/dL — ABNORMAL HIGH (ref 6–20)
Calcium, Ion: 1.17 mmol/L (ref 1.15–1.40)
Chloride: 103 mmol/L (ref 98–111)
Creatinine, Ser: 2 mg/dL — ABNORMAL HIGH (ref 0.44–1.00)
Glucose, Bld: 71 mg/dL (ref 70–99)
HCT: 29 % — ABNORMAL LOW (ref 36.0–46.0)
Hemoglobin: 9.9 g/dL — ABNORMAL LOW (ref 12.0–15.0)
Potassium: 4.1 mmol/L (ref 3.5–5.1)
Sodium: 135 mmol/L (ref 135–145)
TCO2: 25 mmol/L (ref 22–32)

## 2019-03-19 LAB — GLUCOSE, CAPILLARY: Glucose-Capillary: 71 mg/dL (ref 70–99)

## 2019-03-19 LAB — PREGNANCY, URINE: Preg Test, Ur: NEGATIVE

## 2019-03-19 SURGERY — ABDOMINAL AORTOGRAM W/LOWER EXTREMITY
Anesthesia: LOCAL

## 2019-03-19 MED ORDER — FENTANYL CITRATE (PF) 100 MCG/2ML IJ SOLN
INTRAMUSCULAR | Status: AC
  Filled 2019-03-19: qty 2

## 2019-03-19 MED ORDER — SODIUM CHLORIDE 0.9 % WEIGHT BASED INFUSION: 1.0000 mL/kg/h | INTRAVENOUS | Status: DC

## 2019-03-19 MED ORDER — MIDAZOLAM HCL 2 MG/2ML IJ SOLN
INTRAMUSCULAR | Status: AC
  Filled 2019-03-19: qty 2

## 2019-03-19 MED ORDER — HYDRALAZINE HCL 20 MG/ML IJ SOLN: 5.0000 mg | INTRAMUSCULAR | Status: DC | PRN

## 2019-03-19 MED ORDER — HEPARIN (PORCINE) IN NACL 1000-0.9 UT/500ML-% IV SOLN
INTRAVENOUS | Status: AC
  Filled 2019-03-19: qty 1000

## 2019-03-19 MED ORDER — ACETAMINOPHEN 325 MG PO TABS: 650.0000 mg | ORAL_TABLET | ORAL | Status: DC | PRN

## 2019-03-19 MED ORDER — MIDAZOLAM HCL 2 MG/2ML IJ SOLN
INTRAMUSCULAR | Status: DC | PRN
  Administered 2019-03-19 (×3): 1 mg via INTRAVENOUS

## 2019-03-19 MED ORDER — LABETALOL HCL 5 MG/ML IV SOLN: 10.0000 mg | INTRAVENOUS | Status: DC | PRN

## 2019-03-19 MED ORDER — MORPHINE SULFATE (PF) 2 MG/ML IV SOLN: 2.0000 mg | INTRAVENOUS | Status: DC | PRN

## 2019-03-19 MED ORDER — LIDOCAINE HCL (PF) 1 % IJ SOLN
INTRAMUSCULAR | Status: DC | PRN
  Administered 2019-03-19: 15 mL

## 2019-03-19 MED ORDER — ONDANSETRON HCL 4 MG/2ML IJ SOLN: 4.0000 mg | Freq: Four times a day (QID) | INTRAMUSCULAR | Status: DC | PRN

## 2019-03-19 MED ORDER — SODIUM CHLORIDE 0.9% FLUSH: 3.0000 mL | Freq: Two times a day (BID) | INTRAVENOUS | Status: DC

## 2019-03-19 MED ORDER — FENTANYL CITRATE (PF) 100 MCG/2ML IJ SOLN
INTRAMUSCULAR | Status: DC | PRN
  Administered 2019-03-19 (×2): 50 ug via INTRAVENOUS

## 2019-03-19 MED ORDER — LIDOCAINE HCL (PF) 1 % IJ SOLN
INTRAMUSCULAR | Status: AC
  Filled 2019-03-19: qty 30

## 2019-03-19 MED ORDER — HEPARIN (PORCINE) IN NACL 1000-0.9 UT/500ML-% IV SOLN
INTRAVENOUS | Status: DC | PRN
  Administered 2019-03-19 (×2): 500 mL

## 2019-03-19 MED ORDER — SODIUM CHLORIDE 0.9 % IV SOLN
INTRAVENOUS | Status: DC
  Administered 2019-03-19: 08:00:00 via INTRAVENOUS

## 2019-03-19 MED ORDER — SODIUM CHLORIDE 0.9 % IV SOLN: 250.0000 mL | INTRAVENOUS | Status: DC | PRN

## 2019-03-19 MED ORDER — OXYCODONE HCL 5 MG PO TABS: 5.0000 mg | ORAL_TABLET | ORAL | Status: DC | PRN

## 2019-03-19 MED ORDER — SODIUM CHLORIDE 0.9% FLUSH: 3.0000 mL | INTRAVENOUS | Status: DC | PRN

## 2019-03-19 SURGICAL SUPPLY — 18 items
CATH OMNI FLUSH 5F 65CM (CATHETERS) ×1 IMPLANT
CATH SOFT-VU 4F 65 STRAIGHT (CATHETERS) IMPLANT
CATH SOFT-VU STRAIGHT 4F 65CM (CATHETERS) ×3
CLOSURE MYNX CONTROL 5F (Vascular Products) ×1 IMPLANT
DEVICE TORQUE H2O (MISCELLANEOUS) ×1 IMPLANT
DRAPE ZERO GRAVITY STERILE (DRAPES) ×1 IMPLANT
FILTER CO2 0.2 MICRON (VASCULAR PRODUCTS) ×1 IMPLANT
GUIDEWIRE ANGLED .035X150CM (WIRE) ×1 IMPLANT
KIT MICROPUNCTURE NIT STIFF (SHEATH) ×1 IMPLANT
KIT PV (KITS) ×3 IMPLANT
RESERVOIR CO2 (VASCULAR PRODUCTS) ×1 IMPLANT
SET FLUSH CO2 (MISCELLANEOUS) ×1 IMPLANT
SHEATH PINNACLE 5F 10CM (SHEATH) ×1 IMPLANT
SHEATH PROBE COVER 6X72 (BAG) ×1 IMPLANT
SYR MEDRAD MARK V 150ML (SYRINGE) ×1 IMPLANT
TRANSDUCER W/STOPCOCK (MISCELLANEOUS) ×3 IMPLANT
TRAY PV CATH (CUSTOM PROCEDURE TRAY) ×3 IMPLANT
WIRE BENTSON .035X145CM (WIRE) ×1 IMPLANT

## 2019-03-19 NOTE — Progress Notes (Signed)
Discharge instructions reviewed with patient. Transportation called, waiting for arrival. Verbalized understanding of instructions.

## 2019-03-19 NOTE — Interval H&P Note (Signed)
History and Physical Interval Note:  03/19/2019 8:52 AM  [name] [name]  has presented today for surgery, with the diagnosis of pad.  The various methods of treatment have been discussed with the patient and family. After consideration of risks, benefits and other options for treatment, the patient has consented to  Procedure(s): LOWER EXTREMITY ANGIOGRAPHY (N/A) as a surgical intervention.  The patient's history has been reviewed, patient examined, no change in status, stable for surgery.  I have reviewed the patient's chart and labs.  Questions were answered to the patient's satisfaction.     Annamarie Major

## 2019-03-19 NOTE — Discharge Instructions (Signed)
Femoral Site Care °This sheet gives you information about how to care for yourself after your procedure. Your health care provider may also give you more specific instructions. If you have problems or questions, contact your health care provider. °What can I expect after the procedure? °After the procedure, it is common to have: °· Bruising that usually fades within 1-2 weeks. °· Tenderness at the site. °Follow these instructions at home: °Wound care °· Follow instructions from your health care provider about how to take care of your insertion site. Make sure you: °? Wash your hands with soap and water before you change your bandage (dressing). If soap and water are not available, use hand sanitizer. °? Change your dressing as told by your health care provider. °? Leave stitches (sutures), skin glue, or adhesive strips in place. These skin closures may need to stay in place for 2 weeks or longer. If adhesive strip edges start to loosen and curl up, you may trim the loose edges. Do not remove adhesive strips completely unless your health care provider tells you to do that. °· Do not take baths, swim, or use a hot tub until your health care provider approves. °· You may shower 24-48 hours after the procedure or as told by your health care provider. °? Gently wash the site with plain soap and water. °? Pat the area dry with a clean towel. °? Do not rub the site. This may cause bleeding. °· Do not apply powder or lotion to the site. Keep the site clean and dry. °· Check your femoral site every day for signs of infection. Check for: °? Redness, swelling, or pain. °? Fluid or blood. °? Warmth. °? Pus or a bad smell. °Activity °· For the first 2-3 days after your procedure, or as long as directed: °? Avoid climbing stairs as much as possible. °? Do not squat. °· Do not lift anything that is heavier than 10 lb (4.5 kg), or the limit that you are told, until your health care provider says that it is safe. °· Rest as  directed. °? Avoid sitting for a long time without moving. Get up to take short walks every 1-2 hours. °· Do not drive for 24 hours if you were given a medicine to help you relax (sedative). °General instructions °· Take over-the-counter and prescription medicines only as told by your health care provider. °· Keep all follow-up visits as told by your health care provider. This is important. °Contact a health care provider if you have: °· A fever or chills. °· You have redness, swelling, or pain around your insertion site. °Get help right away if: °· The catheter insertion area swells very fast. °· You pass out. °· You suddenly start to sweat or your skin gets clammy. °· The catheter insertion area is bleeding, and the bleeding does not stop when you hold steady pressure on the area. °· The area near or just beyond the catheter insertion site becomes pale, cool, tingly, or numb. °These symptoms may represent a serious problem that is an emergency. Do not wait to see if the symptoms will go away. Get medical help right away. Call your local emergency services (911 in the [place] Do not drive yourself to the hospital. °Summary °· After the procedure, it is common to have bruising that usually fades within 1-2 weeks. °· Check your femoral site every day for signs of infection. °· Do not lift anything that is heavier than 10 lb (4.5 kg), or the   limit that you are told, until your health care provider says that it is safe. °This information is not intended to replace advice given to you by your health care provider. Make sure you discuss any questions you have with your health care provider. °Document Released: 01/10/2014 Document Revised: 05/22/2017 Document Reviewed: 05/22/2017 °Elsevier Patient Education © 2020 [employer] Inc. ° °

## 2019-03-19 NOTE — Op Note (Signed)
    Patient name: [name] [name] MRN: [id number] DOB: [birth year] Sex: female  03/19/2019 Pre-operative Diagnosis: non-healing left BKA Post-operative diagnosis:  Same Surgeon:  Annamarie Major Procedure Performed:  1.  U/s guided access, right femoral artery  2.  Abdominal aortogram with CO2  3.  2nd order catheter  4.  Left leg runoff  5.  Conscious sedation 46 minutes  6.  mynx closure    Indications:  The patient has a non-healing left BKA with recurrent cellulitis.  She is here today for arterial evaluation  Procedure:  The patient was identified in the holding area and taken to room 8.  The patient was then placed supine on the table and prepped and draped in the usual sterile fashion.  A time out was called.  Conscious sedation was administered with the use of IV fentanyl and Versed under continuous physician and nurse monitoring.  Heart rate, blood pressure, and oxygen saturation were continuously monitored.  Total sedation time was 11minutes.  Ultrasound was used to evaluate the right common femoral artery.  It was patent .  A digital ultrasound image was acquired.  A micropuncture needle was used to access the right common femoral artery under ultrasound guidance.  An 018 wire was advanced without resistance and a micropuncture sheath was placed.  The 018 wire was removed and a benson wire was placed.  The micropuncture sheath was exchanged for a 5 french sheath.  An omniflush catheter was advanced over the wire to the level of L-1.  An abdominal angiogram with CO2 was obtained.  Next, using the omniflush catheter and a benson wire, the aortic bifurcation was crossed and the catheter was placed into theleft external iliac artery and left runoff was obtained with CO2.  Findings:   Aortogram:  No renal stenosis.  The aorta and iliac arteries are widely patent  Right Lower Extremity:  Not evlauted  Left Lower Extremity:  The common femoral, perfunda,, SFA and popliteal artery down to  the patella were widely patent without stenosi  Intervention:  Mynx closure perforemd  Impression:  #1  No stenosis identified  #2  Refer back to Dr. Sharol Given for stump management     V. Annamarie Major, M.D., [facility] [place] Office: [phone] Pager:  [phone]

## 2019-03-20 ENCOUNTER — Ambulatory Visit: Payer: Medicare Other | Admitting: Internal Medicine

## 2019-03-20 ENCOUNTER — Encounter (HOSPITAL_COMMUNITY): Payer: Self-pay | Admitting: Surgery

## 2019-03-20 ENCOUNTER — Encounter: Payer: Medicare Other | Admitting: Internal Medicine

## 2019-03-22 ENCOUNTER — Other Ambulatory Visit: Payer: Self-pay | Admitting: Internal Medicine

## 2019-03-22 DIAGNOSIS — Z89512 Acquired absence of left leg below knee: Secondary | ICD-10-CM

## 2019-03-22 DIAGNOSIS — M170 Bilateral primary osteoarthritis of knee: Secondary | ICD-10-CM

## 2019-03-26 ENCOUNTER — Ambulatory Visit: Payer: Medicare Other | Admitting: Licensed Clinical Social Worker

## 2019-03-26 ENCOUNTER — Other Ambulatory Visit: Payer: Self-pay | Admitting: *Deleted

## 2019-03-28 ENCOUNTER — Other Ambulatory Visit: Payer: Self-pay

## 2019-03-28 ENCOUNTER — Ambulatory Visit: Payer: Medicare Other | Admitting: Internal Medicine

## 2019-03-28 ENCOUNTER — Other Ambulatory Visit: Payer: Self-pay | Admitting: Internal Medicine

## 2019-03-29 ENCOUNTER — Telehealth: Payer: Self-pay | Admitting: Internal Medicine

## 2019-03-29 NOTE — Telephone Encounter (Signed)
Called pt - stated Dr Trula Slade had told her to stop Lisinopril before her procedure and Dr Tarri Abernethy agreed. And she has not started back taking it; so she wants to know when and/or if she needs to re-start this medication? Thanks

## 2019-03-29 NOTE — Progress Notes (Signed)
Patient was scheduled for a annual wellness but did not show up.

## 2019-03-29 NOTE — Progress Notes (Signed)
Patient did not show up for her appointment

## 2019-03-29 NOTE — Telephone Encounter (Signed)
Patient wants to know if she should start back taking the lisinopril (PRINIVIL,ZESTRIL) 10 MG tablet since she is unable to make a follow up appt until 05/02/2019.  Please advise.

## 2019-04-01 NOTE — Telephone Encounter (Signed)
Okay to restart now. Thank you.

## 2019-04-01 NOTE — Telephone Encounter (Signed)
RTC to patient, informed it was okay to restart lisinopril per Dr. Jerrell Mylar instructions, she verbalized understanding.

## 2019-04-02 ENCOUNTER — Ambulatory Visit: Payer: Medicare Other | Admitting: Licensed Clinical Social Worker

## 2019-04-04 ENCOUNTER — Other Ambulatory Visit: Payer: Self-pay | Admitting: Internal Medicine

## 2019-04-12 ENCOUNTER — Emergency Department (HOSPITAL_COMMUNITY)
Admission: EM | Admit: 2019-04-12 | Discharge: 2019-04-13 | Discharge status: Against Medical Advice | Payer: Medicare Other | Attending: Emergency Medicine | Admitting: Emergency Medicine

## 2019-04-12 ENCOUNTER — Other Ambulatory Visit: Payer: Self-pay

## 2019-04-12 ENCOUNTER — Encounter (HOSPITAL_COMMUNITY): Payer: Self-pay | Admitting: Emergency Medicine

## 2019-04-12 ENCOUNTER — Emergency Department (HOSPITAL_COMMUNITY): Payer: Medicare Other

## 2019-04-12 DIAGNOSIS — E039 Hypothyroidism, unspecified: Secondary | ICD-10-CM | POA: Insufficient documentation

## 2019-04-12 DIAGNOSIS — M25562 Pain in left knee: Secondary | ICD-10-CM | POA: Diagnosis not present

## 2019-04-12 DIAGNOSIS — E1122 Type 2 diabetes mellitus with diabetic chronic kidney disease: Secondary | ICD-10-CM | POA: Diagnosis not present

## 2019-04-12 DIAGNOSIS — F319 Bipolar disorder, unspecified: Secondary | ICD-10-CM | POA: Diagnosis not present

## 2019-04-12 DIAGNOSIS — I129 Hypertensive chronic kidney disease with stage 1 through stage 4 chronic kidney disease, or unspecified chronic kidney disease: Secondary | ICD-10-CM | POA: Diagnosis not present

## 2019-04-12 DIAGNOSIS — N183 Chronic kidney disease, stage 3 unspecified: Secondary | ICD-10-CM | POA: Insufficient documentation

## 2019-04-12 DIAGNOSIS — Z89512 Acquired absence of left leg below knee: Secondary | ICD-10-CM | POA: Insufficient documentation

## 2019-04-12 DIAGNOSIS — Z79899 Other long term (current) drug therapy: Secondary | ICD-10-CM | POA: Insufficient documentation

## 2019-04-12 DIAGNOSIS — M25462 Effusion, left knee: Secondary | ICD-10-CM | POA: Diagnosis not present

## 2019-04-12 DIAGNOSIS — F17210 Nicotine dependence, cigarettes, uncomplicated: Secondary | ICD-10-CM | POA: Diagnosis not present

## 2019-04-12 LAB — BASIC METABOLIC PANEL
Anion gap: 8 (ref 5–15)
BUN: 33 mg/dL — ABNORMAL HIGH (ref 6–20)
CO2: 23 mmol/L (ref 22–32)
Calcium: 8.2 mg/dL — ABNORMAL LOW (ref 8.9–10.3)
Chloride: 106 mmol/L (ref 98–111)
Creatinine, Ser: 2.52 mg/dL — ABNORMAL HIGH (ref 0.44–1.00)
GFR calc Af Amer: 26 mL/min — ABNORMAL LOW (ref >60)
GFR calc non Af Amer: 23 mL/min — ABNORMAL LOW (ref >60)
Glucose, Bld: 238 mg/dL — ABNORMAL HIGH (ref 70–99)
Potassium: 4.2 mmol/L (ref 3.5–5.1)
Sodium: 137 mmol/L (ref 135–145)

## 2019-04-12 LAB — CBC
HCT: 26.5 % — ABNORMAL LOW (ref 36.0–46.0)
Hemoglobin: 8.4 g/dL — ABNORMAL LOW (ref 12.0–15.0)
MCH: 26.2 pg (ref 26.0–34.0)
MCHC: 31.7 g/dL (ref 30.0–36.0)
MCV: 82.6 fL (ref 80.0–100.0)
Platelets: 259 10*3/uL (ref 150–400)
RBC: 3.21 MIL/uL — ABNORMAL LOW (ref 3.87–5.11)
RDW: 14 % (ref 11.5–15.5)
WBC: 5.6 10*3/uL (ref 4.0–10.5)
nRBC: 0 % (ref 0.0–0.2)

## 2019-04-12 LAB — CBG MONITORING, ED: Glucose-Capillary: 243 mg/dL — ABNORMAL HIGH (ref 70–99)

## 2019-04-12 LAB — I-STAT BETA HCG BLOOD, ED (MC, WL, AP ONLY): I-stat hCG, quantitative: <5 m[IU]/mL (ref <5)

## 2019-04-12 MED ORDER — OXYCODONE-ACETAMINOPHEN 5-325 MG PO TABS: 2.0000 | ORAL_TABLET | ORAL | 0 refills | Status: DC | PRN

## 2019-04-12 MED ORDER — CLINDAMYCIN HCL 300 MG PO CAPS: 600.0000 mg | ORAL_CAPSULE | Freq: Three times a day (TID) | ORAL | 0 refills | Status: AC

## 2019-04-12 MED ORDER — OXYCODONE-ACETAMINOPHEN 5-325 MG PO TABS
1.0000 | ORAL_TABLET | Freq: Once | ORAL | Status: AC
  Administered 2019-04-12: 21:00:00 1 via ORAL
  Filled 2019-04-12: qty 1

## 2019-04-12 MED ORDER — CLINDAMYCIN HCL 150 MG PO CAPS
300.0000 mg | ORAL_CAPSULE | Freq: Once | ORAL | Status: AC
  Administered 2019-04-12: 300 mg via ORAL
  Filled 2019-04-12: qty 2

## 2019-04-12 NOTE — Discharge Instructions (Signed)
Follow up with your primary care doctor.

## 2019-04-12 NOTE — ED Triage Notes (Signed)
Pt reports feeling unwell and weak for a few days. Reports not being able to put on her prastetic leg due to pain and swelling.

## 2019-04-12 NOTE — ED Provider Notes (Signed)
[facility] EMERGENCY DEPARTMENT Provider Note   CSN: [id number] Arrival date & time: 04/12/19  1738     History   Chief Complaint Chief Complaint  Patient presents with   Weakness    HPI [name] [name] is a 43 y.o. female.     The history is provided by the patient.  Knee Pain Location:  Knee Knee location:  L knee Pain details:    Quality:  Aching   Radiates to:  Does not radiate   Severity:  Mild   Onset quality:  Gradual   Timing:  Intermittent   Progression:  Waxing and waning Chronicity:  Recurrent Relieved by:  Acetaminophen Worsened by:  Bearing weight Associated symptoms: swelling   Associated symptoms: no back pain, no decreased ROM, no fatigue, no fever, no itching, no neck pain, no numbness, no stiffness and no tingling   Risk factors: obesity   Risk factors comment:  Patient with history of CKD, status post amputation below left knee, pain at that leg when wears prostheic    Past Medical History:  Diagnosis Date   Abdominal muscle pain 09/08/2016   Abnormal Pap smear of cervix 2009   Abscess    history of multiple abscesses   Acute bilateral low back pain 02/13/2017   Acute blood loss anemia    Acute renal failure ([place]) 07/12/2012   AKI (acute kidney injury) ([place])    Anemia of chronic disease 2002   Anxiety    Panic attacks   Bilateral lower extremity edema 05/13/2016   Bipolar disorder ([place])    Cellulitis 05/21/2014   right eye   Chronic bronchitis ([place])    "get it q yr" (05/13/2013)   Chronic pain    Depression    Edema of lower extremity    Endocarditis 2002   subacute bacterial endocarditis.    Family history of anesthesia complication    "my mom has a hard time coming out from under"   Fibromyalgia    GERD (gastroesophageal reflux disease)    occ   Heart murmur    Herpes simplex type 1 infection 01/16/2018   History of blood transfusion    "just low blood count" (05/13/2013)    Hyperlipidemia    Hypertension    Hypothyroidism    Hypothyroidism, adult 03/21/2014   Leukocytosis    Necrosis ([place])    and ulceration   Necrotizing fasciitis s/p OR debridements 07/06/2012   Obesity    OSA on CPAP    does not wear CPAP   Peripheral neuropathy    Type II diabetes mellitus ([place])    Type  II    Patient Active Problem List   Diagnosis Date Noted   Tobacco use 03/01/2019   Phantom limb pain ([place]) 11/08/2018   AKI (acute kidney injury) ([place])    Current moderate episode of major depressive disorder ([place])    Anemia of chronic disease    Severe protein-calorie malnutrition ([place])    Status post below knee amputation, left ([place]) 04/11/2017   Bipolar affective disorder ([place])    Adjustment disorder with depressed mood 03/26/2017   Heart failure with preserved ejection fraction ([place]) 02/09/2017   Routine adult health maintenance 12/08/2016   Chronic pain of right knee 09/08/2016   Chronic kidney disease (CKD), stage III (moderate) 07/15/2016   OSA (obstructive sleep apnea) 05/13/2016   Morbid obesity due to excess calories ([place]) 11/27/2015   Hypothyroidism 04/25/2013   Controlled type 2 diabetes mellitus ([place]) 07/12/2012  Carpal tunnel syndrome, bilateral 01/25/2012   Diabetic peripheral neuropathy ([place]) 07/04/2011   Anxiety and depression 06/02/2011   GERD 12/06/2007   Hyperlipidemia 08/29/2007   Hypertension associated with diabetes ([place]) 08/29/2007    Past Surgical History:  Procedure Laterality Date   ABDOMINAL AORTOGRAM W/LOWER EXTREMITY Left 03/19/2019   Procedure: ABDOMINAL AORTOGRAM W/LOWER EXTREMITY;  Surgeon: Serafina Mitchell, MD;  Location: [facility];  Service: Cardiovascular;  Laterality: Left;   AMPUTATION Left 04/07/2017   Procedure: LEFT BELOW KNEE AMPUTATION;  Surgeon: Newt Minion, MD;  Location: [facility];  Service: Orthopedics;  Laterality: Left;   EYE SURGERY     lazer   INCISION AND DRAINAGE  ABSCESS     multiple I&Ds   INCISION AND DRAINAGE ABSCESS Left 07/09/2012   Procedure: DRESSING CHANGE, THIGH WOUND;  Surgeon: Harl Bowie, MD;  Location: [facility];  Service: General;  Laterality: Left;   INCISION AND DRAINAGE OF WOUND Left 07/07/2012   Procedure: IRRIGATION AND DEBRIDEMENT WOUND;  Surgeon: Harl Bowie, MD;  Location: [facility];  Service: General;  Laterality: Left;   INCISION AND DRAINAGE PERIRECTAL ABSCESS Left 07/14/2012   Procedure: DEBRIDEMENT OF SKIN & SOFT TISSUE; DRESSING CHANGE UNDER ANESTHESIA;  Surgeon: Gayland Curry, MD,FACS;  Location: [facility];  Service: General;  Laterality: Left;   INCISION AND DRAINAGE PERIRECTAL ABSCESS Left 07/16/2012   Procedure: I&D Left Thigh;  Surgeon: Gwenyth Ober, MD;  Location: [facility];  Service: General;  Laterality: Left;   INCISION AND DRAINAGE PERIRECTAL ABSCESS N/A 01/05/2015   Procedure: IRRIGATION AND DEBRIDEMENT PERIRECTAL ABSCESS;  Surgeon: Donnie Mesa, MD;  Location: [facility];  Service: General;  Laterality: N/A;   IRRIGATION AND DEBRIDEMENT ABSCESS Left 07/06/2012   Procedure: IRRIGATION AND DEBRIDEMENT ABSCESS BUTTOCKS AND THIGH;  Surgeon: Shann Medal, MD;  Location: [facility];  Service: General;  Laterality: Left;   IRRIGATION AND DEBRIDEMENT ABSCESS Left 08/10/2012   Procedure: IRRIGATION AND DEBRIDEMENT ABSCESS;  Surgeon: Madilyn Hook, DO;  Location: [facility];  Service: General;  Laterality: Left;   STUMP REVISION Left 06/09/2017   Procedure: REVISION LEFT BELOW KNEE AMPUTATION;  Surgeon: Newt Minion, MD;  Location: [facility];  Service: Orthopedics;  Laterality: Left;   TONSILLECTOMY  1994     OB History   No obstetric history on file.      Home Medications    Prior to Admission medications   Medication Sig Start Date End Date Taking? Authorizing Provider  amLODipine (NORVASC) 10 MG tablet Take 1 tablet (10 mg total) by mouth daily. 06/29/18  Yes Helberg, Larkin Ina, MD  atorvastatin (LIPITOR) 20 MG tablet Take 1  tablet (20 mg total) by mouth daily. 06/29/18  Yes Helberg, Larkin Ina, MD  bumetanide (BUMEX) 1 MG tablet TAKE 1 TABLET BY MOUTH 2 TIMES DAILY Patient taking differently: Take 1 mg by mouth daily.  03/07/19  Yes Helberg, Larkin Ina, MD  diclofenac sodium (VOLTAREN) 1 % GEL Apply 4 grams to affected area 4 times daily as needed for pain Patient taking differently: Apply 1 application topically 4 (four) times daily as needed (pain).  08/23/18  Yes Helberg, Larkin Ina, MD  FLUoxetine (PROZAC) 20 MG/5ML solution Take 15 mLs (60 mg total) by mouth daily. Patient taking differently: Take 60 mg by mouth 2 (two) times daily.  06/29/18 06/24/19 Yes Helberg, Larkin Ina, MD  hydrOXYzine (ATARAX/VISTARIL) 25 MG tablet TAKE 1 TABLET BY MOUTH EVERY SIX HOURS AS NEEDED FOR ANXIETY Patient taking differently: Take 25 mg  by mouth every 6 (six) hours as needed for anxiety.  03/29/19  Yes Helberg, Larkin Ina, MD  Insulin Degludec-Liraglutide (XULTOPHY) 100-3.6 UNIT-MG/ML SOPN Inject 30 Units into the skin daily. Patient taking differently: Inject 30 Units into the skin at bedtime.  03/04/19  Yes Helberg, Larkin Ina, MD  lisinopril (PRINIVIL,ZESTRIL) 10 MG tablet Take 1 tablet (10 mg total) by mouth daily. 06/29/18  Yes Helberg, Larkin Ina, MD  metoprolol tartrate (LOPRESSOR) 25 MG tablet Take 0.5 tablets (12.5 mg total) by mouth 2 (two) times daily. 06/29/18  Yes Helberg, Larkin Ina, MD  pregabalin (LYRICA) 50 MG capsule Take 1 capsule (50 mg total) by mouth 2 (two) times daily. Patient taking differently: Take 50 mg by mouth 3 (three) times daily.  11/08/18  Yes Helberg, Larkin Ina, MD  tetrahydrozoline 0.05 % ophthalmic solution Place 2 drops into both eyes 2 (two) times daily as needed (red/ dry eyes). Alcon   Yes [provider]  varenicline (CHANTIX PAK) 0.5 MG X 11 & 1 MG X 42 tablet Take one 0.5 mg tablet by mouth once daily for 3 days, then increase to one 0.5 mg tablet twice daily for 4 days, then increase to one 1 mg tablet twice daily. Patient  taking differently: Take 0.5-1 mg by mouth See admin instructions. Take one 0.5 mg tablet by mouth once daily for 3 days, then increase to one 0.5 mg tablet twice daily for 4 days, then increase to one 1 mg tablet twice daily. 02/28/19  Yes Helberg, Larkin Ina, MD  aluminum chloride (DRYSOL) 20 % external solution Apply topically at bedtime. Patient not taking: Reported on 03/13/2019 10/23/18   Suzan Slick, NP  amitriptyline (ELAVIL) 50 MG tablet Take 50 mg by mouth at bedtime. 09/03/18   [provider]  blood glucose meter kit and supplies KIT ICD 10- E11.69. Based on patient and insurance preference. Use up to four times daily as directed. 05/01/18   Love, Ivan Anchors, PA-C  clindamycin (CLEOCIN) 300 MG capsule Take 2 capsules (600 mg total) by mouth 3 (three) times daily for 7 days. 04/12/19 04/19/19  Densil Ottey, DO  iron polysaccharides (NIFEREX) 150 MG capsule Take 1 capsule (150 mg total) by mouth 2 (two) times daily before lunch and supper. Patient not taking: Reported on 03/13/2019 06/29/18   Ina Homes, MD  mupirocin ointment (BACTROBAN) 2 % Apply 1 application to affected area 2 times daily Patient not taking: No sig reported 12/22/18   Ina Homes, MD  NYSTATIN powder Apply topically 4 times daily. After cleaning and drying the affected area for two weeks Patient not taking: No sig reported 09/18/18   Aldine Contes, MD  oxyCODONE-acetaminophen (PERCOCET/ROXICET) 5-325 MG tablet Take 2 tablets by mouth every 4 (four) hours as needed for up to 10 doses for severe pain. 04/12/19   Lennice Sites, DO    Family History Family History  Problem Relation Age of Onset   Heart failure Mother    Diabetes Mother    Kidney disease Mother    Kidney disease Father    Diabetes Father    Diabetes Paternal Grandmother    Heart failure Paternal Grandmother    Other Other     Social History Social History   Tobacco Use   Smoking status: Current Every Day Smoker     Packs/day: 0.50    Years: 16.00    Pack years: 8.00    Types: Cigarettes   Smokeless tobacco: Never Used   Tobacco comment: 1 PPD  Substance Use Topics  Alcohol use: Yes    Alcohol/week: 0.0 standard drinks    Comment: Socially-" monthy  maybe"   Drug use: No     Allergies   Vancomycin   Review of Systems Review of Systems  Constitutional: Negative for chills, fatigue and fever.  HENT: Negative for ear pain and sore throat.   Eyes: Negative for pain and visual disturbance.  Respiratory: Negative for cough and shortness of breath.   Cardiovascular: Negative for chest pain and palpitations.  Gastrointestinal: Negative for abdominal pain and vomiting.  Genitourinary: Negative for dysuria and hematuria.  Musculoskeletal: Positive for arthralgias. Negative for back pain, neck pain and stiffness.  Skin: Negative for color change, itching and rash.  Neurological: Positive for weakness. Negative for seizures and syncope.  All other systems reviewed and are negative.    Physical Exam Updated Vital Signs  ED Triage Vitals  Enc Vitals Group     BP 04/12/19 1748 107/62     Pulse Rate 04/12/19 1748 (!) 107     Resp 04/12/19 1748 16     Temp 04/12/19 1748 98.1 F (36.7 C)     Temp Source 04/12/19 1748 Oral     SpO2 04/12/19 1748 98 %     Weight 04/12/19 1751 275 lb (124.7 kg)     Height 04/12/19 1751 _0  (1.778 m)     Head Circumference --      Peak Flow --      Pain Score 04/12/19 1751 9     Pain Loc --      Pain Edu? --      Excl. in [place]? --     Physical Exam Vitals signs and nursing note reviewed.  Constitutional:      General: She is not in acute distress.    Appearance: She is well-developed.  HENT:     Head: Normocephalic and atraumatic.     Nose: Nose normal.     Mouth/Throat:     Mouth: Mucous membranes are moist.  Eyes:     Extraocular Movements: Extraocular movements intact.     Conjunctiva/sclera: Conjunctivae normal.     Pupils: Pupils are  equal, round, and reactive to light.  Neck:     Musculoskeletal: Normal range of motion and neck supple.  Cardiovascular:     Rate and Rhythm: Normal rate and regular rhythm.     Pulses: Normal pulses.     Heart sounds: Normal heart sounds. No murmur.  Pulmonary:     Effort: Pulmonary effort is normal. No respiratory distress.     Breath sounds: Normal breath sounds.  Abdominal:     Palpations: Abdomen is soft.     Tenderness: There is no abdominal tenderness.  Musculoskeletal: Normal range of motion.        General: Tenderness (around left knee) present. No swelling.  Skin:    General: Skin is warm and dry.     Capillary Refill: Capillary refill takes less than 2 seconds.     Findings: No rash.  Neurological:     General: No focal deficit present.     Mental Status: She is alert.  Psychiatric:        Mood and Affect: Mood normal.      ED Treatments / Results  Labs (all labs ordered are listed, but only abnormal results are displayed) Labs Reviewed  BASIC METABOLIC PANEL - Abnormal; Notable for the following components:      Result Value   Glucose, Bld 238 (*)  BUN 33 (*)    Creatinine, Ser 2.52 (*)    Calcium 8.2 (*)    GFR calc non Af Amer 23 (*)    GFR calc Af Amer 26 (*)    All other components within normal limits  CBC - Abnormal; Notable for the following components:   RBC 3.21 (*)    Hemoglobin 8.4 (*)    HCT 26.5 (*)    All other components within normal limits  CBG MONITORING, ED - Abnormal; Notable for the following components:   Glucose-Capillary 243 (*)    All other components within normal limits  URINALYSIS, ROUTINE W REFLEX MICROSCOPIC  CBG MONITORING, ED  I-STAT BETA HCG BLOOD, ED ([facility], [facility], [facility] ONLY)    EKG None  Radiology Dg Knee Complete 4 Views Left  Result Date: 04/12/2019 CLINICAL DATA:  History of below-knee amputation of the left lower extremity. Swelling of the distal stump EXAM: LEFT KNEE - COMPLETE 4+ VIEW COMPARISON:  MRI  02/13/2019 FINDINGS: Prior below-knee amputation of the left lower extremity. Mild hypertrophic osseous changes at the amputation site. No definite cortical destruction or periostitis to suggest acute osteomyelitis. Diffuse nonspecific soft tissue swelling and edema no soft tissue gas. Small knee joint effusion. Tricompartmental osteoarthritis of the left knee. IMPRESSION: 1. No radiographic evidence of acute osteomyelitis. 2. Diffuse soft tissue swelling and edema. Correlate for cellulitis. 3. Prior below-knee amputation. 4. Small knee joint effusion. 5. Tricompartmental osteoarthritis of the knee. Electronically Signed   By: Davina Poke M.D.   On: 04/12/2019 20:58    Procedures Procedures (including critical care time)  Medications Ordered in ED Medications  clindamycin (CLEOCIN) capsule 300 mg (has no administration in time range)  oxyCODONE-acetaminophen (PERCOCET/ROXICET) 5-325 MG per tablet 1 tablet (1 tablet Oral Given 04/12/19 2105)     Initial Impression / Assessment and Plan / ED Course  I have reviewed the triage vital signs and the nursing notes.  Pertinent labs & imaging results that were available during my care of the patient were reviewed by me and considered in my medical decision making (see chart for details).     [name] [name] is a 43 year old female with history of high cholesterol, hypertension, status post BKA on the left who presents to the ED with left lower leg pain, knee pain.  Patient with unremarkable vitals.  No fever.  She states that she has pain in her knee especially when she is using her prosthetic and bearing weight.  She is tender on the left knee.  Overall lower extremity does not appear to have any acute infectious findings.  There is no warmth or erythema to the lower leg.  I suspect that this is likely arthritic type pain.  No obvious swelling of the knee joint.  She has fairly good range of motion at the knee joint and no concern for septic  joint.  No significant skin breakdown.  No significant anemia, electrolyte abnormality, kidney injury.  X-ray shows no fracture or osteomyelitis.  There is some soft tissue swelling which I believe is likely inflammatory likely from prosthetic use.  She has arthritis of the knee with a small effusion.  Shared decision was made to conservatively start antibiotics however recommend ice, elevation for likely inflamamtion.  She states that she ran out of her chronic pain medication and has been unable to follow-up with her primary care doctor.  She does get monthly narcotic scripts.  I will send in a prescription that she likely will be  able to fill on Monday.  Recommend that she follow-up with her primary care doctor for additional pain medication and re-evaluation.  Recommend that she stay off of the prosthetic as much as she can.  She does have a walker at home. Given return precautions and discharged form ED in good condition.  This chart was dictated using voice recognition software.  Despite best efforts to proofread,  errors can occur which can change the documentation meaning.    Final Clinical Impressions(s) / ED Diagnoses   Final diagnoses:  Acute pain of left knee    ED Discharge Orders         Ordered    oxyCODONE-acetaminophen (PERCOCET/ROXICET) 5-325 MG tablet  Every 4 hours PRN     04/12/19 2219    clindamycin (CLEOCIN) 300 MG capsule  3 times daily     04/12/19 2219           Lennice Sites, DO 04/12/19 2227

## 2019-04-15 ENCOUNTER — Ambulatory Visit (INDEPENDENT_AMBULATORY_CARE_PROVIDER_SITE_OTHER): Payer: Medicare Other | Admitting: Orthopedic Surgery

## 2019-04-15 ENCOUNTER — Other Ambulatory Visit: Payer: Self-pay

## 2019-04-15 ENCOUNTER — Encounter: Payer: Self-pay | Admitting: Orthopedic Surgery

## 2019-04-15 VITALS — Ht 70.0 in | Wt 275.0 lb

## 2019-04-15 DIAGNOSIS — G8929 Other chronic pain: Secondary | ICD-10-CM

## 2019-04-15 DIAGNOSIS — M1712 Unilateral primary osteoarthritis, left knee: Secondary | ICD-10-CM

## 2019-04-15 DIAGNOSIS — M25562 Pain in left knee: Secondary | ICD-10-CM | POA: Diagnosis not present

## 2019-04-15 DIAGNOSIS — Z89512 Acquired absence of left leg below knee: Secondary | ICD-10-CM | POA: Diagnosis not present

## 2019-04-15 NOTE — Progress Notes (Addendum)
Office Visit Note   Patient: [name] [name]           Date of Birth: November 06, 1975           MRN: [id number] Visit Date: 04/15/2019              Requested by: Ina Homes, MD [address] [place],  [place] [address] PCP: Ina Homes, MD  Chief Complaint  Patient presents with   Right Knee - Pain, Follow-up   Left Knee - Pain, Follow-up      HPI: Patient is a 43 year old woman left transtibial amputation with acute pain and swelling and warmth in her left knee.  Patient did go to the emergency room she was started on antibiotics and is seen today for initial evaluation for the left knee.  Assessment & Plan: Visit Diagnoses:  1. Status post below knee amputation, left ([place])   2. Unilateral primary osteoarthritis, left knee   3. Chronic pain of left knee     Plan: We will draw a uric acid, the knee was injected without complications follow-up in 2 weeks.  We will call her with the results of the uric acid.  Prescription written for a wide wheelchair.  Follow-Up Instructions: Return in about 2 weeks (around 04/29/2019).   Ortho Exam  Patient is alert, oriented, no adenopathy, well-dressed, normal affect, normal respiratory effort. Examination there is some mild warmth to the left knee there is a mild effusion as well.  Patient is only tender to palpation over the medial joint line.  Patellofemoral joint is nontender to palpation lateral joint line is nontender to palpation.  Review of the radiographs of the left knee from November 20 shows periarticular cystic changes consistent with possible gout as well as periarticular bone spurs worse medially consistent with osteoarthritis.  Patient's laboratory records were reviewed no uric acid level in her tests.  Patient's white cell count is low normal.  Imaging: No results found. No images are attached to the encounter.  Labs: Lab Results  Component Value Date   HGBA1C 6.6 (A) 02/28/2019   HGBA1C 7.0 (A) 11/08/2018   HGBA1C 6.5 (A) 06/28/2018   ESRSEDRATE 130 (H) 03/24/2017   ESRSEDRATE 53 (H) 09/14/2015   ESRSEDRATE 132 (H) 07/06/2012   CRP 13.6 (H) 02/14/2019   CRP 38.6 (H) 04/03/2018   CRP 5.4 (H) 09/14/2015   LABURIC 8.3 (H) 04/15/2019   REPTSTATUS 02/17/2019 FINAL 02/12/2019   REPTSTATUS 02/17/2019 FINAL 02/12/2019   GRAMSTAIN  08/11/2017    MODERATE WBC PRESENT, PREDOMINANTLY PMN FEW GRAM POSITIVE COCCI    CULT  02/12/2019    NO GROWTH 5 DAYS Performed at [facility], [place] [address], [place], [place] [address]    CULT  02/12/2019    NO GROWTH 5 DAYS Performed at [facility] [address], [place], [place] [address]    LABORGA GROUP B STREP(S.AGALACTIAE)ISOLATED 04/03/2018     Lab Results  Component Value Date   ALBUMIN 2.1 (L) 02/12/2019   ALBUMIN 2.8 (L) 12/18/2018   ALBUMIN <1.0 (L) 04/11/2018   LABURIC 8.3 (H) 04/15/2019    Lab Results  Component Value Date   MG 2.3 04/07/2018   MG 2.1 04/05/2018   MG 2.4 03/25/2017   No results found for: VD25OH  No results found for: PREALBUMIN CBC EXTENDED Latest Ref Rng & Units 04/12/2019 03/19/2019 03/05/2019  WBC 4.0 - 10.5 K/uL 5.6 - -  RBC 3.87 - 5.11 MIL/uL 3.21(L) - -  HGB 12.0 - 15.0 g/dL 8.4(L) 9.9(L) 11.6(L)  HCT 36.0 - 46.0 % 26.5(L) 29.0(L) 34.0(L)  PLT 150 - 400 K/uL 259 - -  NEUTROABS 1.7 - 7.7 K/uL - - -  LYMPHSABS 0.7 - 4.0 K/uL - - -     Body mass index is 39.46 kg/m.  Orders:  Orders Placed This Encounter  Procedures   Large Joint Inj: L knee   Uric acid   No orders of the defined types were placed in this encounter.    Procedures: Large Joint Inj: L knee on 04/15/2019 3:52 PM Indications: pain and diagnostic evaluation Details: 22 G 1.5 in needle, anteromedial approach  Arthrogram: No  Outcome: tolerated well, no immediate complications Procedure, treatment alternatives, risks and benefits explained, specific risks discussed. Consent was given by the patient. Immediately  prior to procedure a time out was called to verify the correct patient, procedure, equipment, support staff and site/side marked as required. Patient was prepped and draped in the usual sterile fashion.      Clinical Data: No additional findings.  ROS:  All other systems negative, except as noted in the HPI. Review of Systems  Objective: Vital Signs: Ht 5\' 10"  (1.778 m)    Wt 275 lb (124.7 kg)    BMI 39.46 kg/m   Specialty Comments:  No specialty comments available.  PMFS History: Patient Active Problem List   Diagnosis Date Noted   Tobacco use 03/01/2019   Phantom limb pain ([place]) 11/08/2018   AKI (acute kidney injury) ([place])    Current moderate episode of major depressive disorder ([place])    Anemia of chronic disease    Severe protein-calorie malnutrition ([place])    Status post below knee amputation, left ([place]) 04/11/2017   Bipolar affective disorder ([place])    Adjustment disorder with depressed mood 03/26/2017   Heart failure with preserved ejection fraction ([place]) 02/09/2017   Routine adult health maintenance 12/08/2016   Chronic pain of right knee 09/08/2016   Chronic kidney disease (CKD), stage III (moderate) 07/15/2016   OSA (obstructive sleep apnea) 05/13/2016   Morbid obesity due to excess calories ([place]) 11/27/2015   Hypothyroidism 04/25/2013   Controlled type 2 diabetes mellitus ([place]) 07/12/2012   Carpal tunnel syndrome, bilateral 01/25/2012   Diabetic peripheral neuropathy ([place]) 07/04/2011   Anxiety and depression 06/02/2011   GERD 12/06/2007   Hyperlipidemia 08/29/2007   Hypertension associated with diabetes ([place]) 08/29/2007   Past Medical History:  Diagnosis Date   Abdominal muscle pain 09/08/2016   Abnormal Pap smear of cervix 2009   Abscess    history of multiple abscesses   Acute bilateral low back pain 02/13/2017   Acute blood loss anemia    Acute renal failure ([place]) 07/12/2012   AKI (acute kidney injury) ([place])    Anemia of  chronic disease 2002   Anxiety    Panic attacks   Bilateral lower extremity edema 05/13/2016   Bipolar disorder ([place])    Cellulitis 05/21/2014   right eye   Chronic bronchitis ([place])    "get it q yr" (05/13/2013)   Chronic pain    Depression    Edema of lower extremity    Endocarditis 2002   subacute bacterial endocarditis.    Family history of anesthesia complication    "my mom has a hard time coming out from under"   Fibromyalgia    GERD (gastroesophageal reflux disease)    occ   Heart murmur    Herpes simplex type 1 infection 01/16/2018  History of blood transfusion    "just low blood count" (05/13/2013)   Hyperlipidemia    Hypertension    Hypothyroidism    Hypothyroidism, adult 03/21/2014   Leukocytosis    Necrosis ([place])    and ulceration   Necrotizing fasciitis s/p OR debridements 07/06/2012   Obesity    OSA on CPAP    does not wear CPAP   Peripheral neuropathy    Type II diabetes mellitus ([place])    Type  II    Family History  Problem Relation Age of Onset   Heart failure Mother    Diabetes Mother    Kidney disease Mother    Kidney disease Father    Diabetes Father    Diabetes Paternal Grandmother    Heart failure Paternal Grandmother    Other Other     Past Surgical History:  Procedure Laterality Date   ABDOMINAL AORTOGRAM W/LOWER EXTREMITY Left 03/19/2019   Procedure: ABDOMINAL AORTOGRAM W/LOWER EXTREMITY;  Surgeon: Serafina Mitchell, MD;  Location: [facility];  Service: Cardiovascular;  Laterality: Left;   AMPUTATION Left 04/07/2017   Procedure: LEFT BELOW KNEE AMPUTATION;  Surgeon: Newt Minion, MD;  Location: [facility];  Service: Orthopedics;  Laterality: Left;   EYE SURGERY     [facility]   INCISION AND DRAINAGE ABSCESS     multiple I&Ds   INCISION AND DRAINAGE ABSCESS Left 07/09/2012   Procedure: DRESSING CHANGE, THIGH WOUND;  Surgeon: Harl Bowie, MD;  Location: [facility];  Service: General;  Laterality:  Left;   INCISION AND DRAINAGE OF WOUND Left 07/07/2012   Procedure: IRRIGATION AND DEBRIDEMENT WOUND;  Surgeon: Harl Bowie, MD;  Location: [facility];  Service: General;  Laterality: Left;   INCISION AND DRAINAGE PERIRECTAL ABSCESS Left 07/14/2012   Procedure: DEBRIDEMENT OF SKIN & SOFT TISSUE; DRESSING CHANGE UNDER ANESTHESIA;  Surgeon: Gayland Curry, MD,FACS;  Location: [facility];  Service: General;  Laterality: Left;   INCISION AND DRAINAGE PERIRECTAL ABSCESS Left 07/16/2012   Procedure: I&D Left Thigh;  Surgeon: Gwenyth Ober, MD;  Location: [facility];  Service: General;  Laterality: Left;   INCISION AND DRAINAGE PERIRECTAL ABSCESS N/A 01/05/2015   Procedure: IRRIGATION AND DEBRIDEMENT PERIRECTAL ABSCESS;  Surgeon: Donnie Mesa, MD;  Location: [facility];  Service: General;  Laterality: N/A;   IRRIGATION AND DEBRIDEMENT ABSCESS Left 07/06/2012   Procedure: IRRIGATION AND DEBRIDEMENT ABSCESS BUTTOCKS AND THIGH;  Surgeon: Shann Medal, MD;  Location: [facility];  Service: General;  Laterality: Left;   IRRIGATION AND DEBRIDEMENT ABSCESS Left 08/10/2012   Procedure: IRRIGATION AND DEBRIDEMENT ABSCESS;  Surgeon: Madilyn Hook, DO;  Location: [facility];  Service: General;  Laterality: Left;   STUMP REVISION Left 06/09/2017   Procedure: REVISION LEFT BELOW KNEE AMPUTATION;  Surgeon: Newt Minion, MD;  Location: [facility];  Service: Orthopedics;  Laterality: Left;   TONSILLECTOMY  1994   Social History   Occupational History   Occupation: disability  Tobacco Use   Smoking status: Current Every Day Smoker    Packs/day: 0.50    Years: 16.00    Pack years: 8.00    Types: Cigarettes   Smokeless tobacco: Never Used   Tobacco comment: 1 PPD  Substance and Sexual Activity   Alcohol use: Yes    Alcohol/week: 0.0 standard drinks    Comment: Socially-" monthy  maybe"   Drug use: No   Sexual activity: Yes

## 2019-04-15 NOTE — Addendum Note (Signed)
Addended by: Pamella Pert on: 04/15/2019 04:06 PM   Modules accepted: Orders

## 2019-04-16 ENCOUNTER — Telehealth: Payer: Self-pay | Admitting: Orthopedic Surgery

## 2019-04-16 ENCOUNTER — Other Ambulatory Visit: Payer: Self-pay

## 2019-04-16 LAB — URIC ACID: Uric Acid, Serum: 8.3 mg/dL — ABNORMAL HIGH (ref 2.5–7.0)

## 2019-04-16 MED ORDER — COLCHICINE 0.6 MG PO CAPS: 0.6000 mg | ORAL_CAPSULE | Freq: Every day | ORAL | 3 refills | Status: DC

## 2019-04-16 MED ORDER — ALLOPURINOL 100 MG PO TABS: 100.0000 mg | ORAL_TABLET | Freq: Two times a day (BID) | ORAL | 3 refills | Status: DC

## 2019-04-16 NOTE — Telephone Encounter (Signed)
I called and lm on vm to advise ok to change from colchicine caps to colcrys tablet. To call with any questions.

## 2019-04-16 NOTE — Telephone Encounter (Signed)
Received call from [place] with [facility] stating the insurance will not pay for the Rx Colchicine (capsule)   Joy asked if it's ok to change the Rx to Colcrys (Tab) Joy advised patient's medicine will be delivered to her today. The number to contact Caryl Asp is [phone]

## 2019-04-22 ENCOUNTER — Other Ambulatory Visit: Payer: Self-pay | Admitting: Internal Medicine

## 2019-04-22 DIAGNOSIS — B373 Candidiasis of vulva and vagina: Secondary | ICD-10-CM

## 2019-04-22 DIAGNOSIS — E1165 Type 2 diabetes mellitus with hyperglycemia: Secondary | ICD-10-CM

## 2019-04-22 DIAGNOSIS — E1159 Type 2 diabetes mellitus with other circulatory complications: Secondary | ICD-10-CM

## 2019-04-22 DIAGNOSIS — I5032 Chronic diastolic (congestive) heart failure: Secondary | ICD-10-CM

## 2019-04-22 DIAGNOSIS — B3731 Acute candidiasis of vulva and vagina: Secondary | ICD-10-CM

## 2019-04-29 ENCOUNTER — Ambulatory Visit: Payer: Medicare Other | Admitting: Orthopedic Surgery

## 2019-04-29 ENCOUNTER — Other Ambulatory Visit: Payer: Self-pay | Admitting: Internal Medicine

## 2019-04-29 MED ORDER — OXYCODONE-ACETAMINOPHEN 5-325 MG PO TABS: 1.0000 | ORAL_TABLET | Freq: Every day | ORAL | 0 refills | Status: DC | PRN

## 2019-04-29 NOTE — Telephone Encounter (Signed)
Last rx written 04/12/19. Last OV 03/28/19. Next OV 05/02/19. UDS none

## 2019-04-29 NOTE — Telephone Encounter (Signed)
Needs refill on oxyCODONE-acetaminophen (PERCOCET/ROXICET) 5-325 MG tablet  ;pt contact [facility], [place] - [address]

## 2019-05-02 ENCOUNTER — Encounter: Payer: Medicare Other | Admitting: Internal Medicine

## 2019-05-02 DIAGNOSIS — D631 Anemia in chronic kidney disease: Secondary | ICD-10-CM | POA: Diagnosis not present

## 2019-05-02 DIAGNOSIS — E1122 Type 2 diabetes mellitus with diabetic chronic kidney disease: Secondary | ICD-10-CM | POA: Diagnosis not present

## 2019-05-02 DIAGNOSIS — I739 Peripheral vascular disease, unspecified: Secondary | ICD-10-CM | POA: Diagnosis not present

## 2019-05-02 DIAGNOSIS — Z720 Tobacco use: Secondary | ICD-10-CM | POA: Diagnosis not present

## 2019-05-02 DIAGNOSIS — I5032 Chronic diastolic (congestive) heart failure: Secondary | ICD-10-CM | POA: Diagnosis not present

## 2019-05-02 DIAGNOSIS — N179 Acute kidney failure, unspecified: Secondary | ICD-10-CM | POA: Diagnosis not present

## 2019-05-02 DIAGNOSIS — M109 Gout, unspecified: Secondary | ICD-10-CM | POA: Diagnosis not present

## 2019-05-02 DIAGNOSIS — N189 Chronic kidney disease, unspecified: Secondary | ICD-10-CM | POA: Diagnosis not present

## 2019-05-02 DIAGNOSIS — E1129 Type 2 diabetes mellitus with other diabetic kidney complication: Secondary | ICD-10-CM | POA: Diagnosis not present

## 2019-05-02 DIAGNOSIS — N1832 Chronic kidney disease, stage 3b: Secondary | ICD-10-CM | POA: Diagnosis not present

## 2019-05-06 ENCOUNTER — Other Ambulatory Visit: Payer: Self-pay | Admitting: Internal Medicine

## 2019-05-29 ENCOUNTER — Other Ambulatory Visit: Payer: Self-pay | Admitting: Internal Medicine

## 2019-05-29 DIAGNOSIS — E1159 Type 2 diabetes mellitus with other circulatory complications: Secondary | ICD-10-CM

## 2019-05-30 ENCOUNTER — Ambulatory Visit (INDEPENDENT_AMBULATORY_CARE_PROVIDER_SITE_OTHER): Payer: Medicare Other | Admitting: Internal Medicine

## 2019-05-30 ENCOUNTER — Other Ambulatory Visit: Payer: Self-pay

## 2019-05-30 ENCOUNTER — Encounter: Payer: Self-pay | Admitting: Internal Medicine

## 2019-05-30 VITALS — BP 165/98 | HR 94 | Temp 98.6°F | Ht 70.0 in | Wt 286.6 lb

## 2019-05-30 DIAGNOSIS — Z794 Long term (current) use of insulin: Secondary | ICD-10-CM | POA: Diagnosis not present

## 2019-05-30 DIAGNOSIS — Z89512 Acquired absence of left leg below knee: Secondary | ICD-10-CM

## 2019-05-30 DIAGNOSIS — I10 Essential (primary) hypertension: Secondary | ICD-10-CM | POA: Diagnosis not present

## 2019-05-30 DIAGNOSIS — I152 Hypertension secondary to endocrine disorders: Secondary | ICD-10-CM

## 2019-05-30 DIAGNOSIS — G5603 Carpal tunnel syndrome, bilateral upper limbs: Secondary | ICD-10-CM | POA: Diagnosis not present

## 2019-05-30 DIAGNOSIS — E1169 Type 2 diabetes mellitus with other specified complication: Secondary | ICD-10-CM | POA: Diagnosis not present

## 2019-05-30 DIAGNOSIS — F329 Major depressive disorder, single episode, unspecified: Secondary | ICD-10-CM | POA: Diagnosis not present

## 2019-05-30 DIAGNOSIS — F419 Anxiety disorder, unspecified: Secondary | ICD-10-CM | POA: Diagnosis not present

## 2019-05-30 DIAGNOSIS — E1159 Type 2 diabetes mellitus with other circulatory complications: Secondary | ICD-10-CM | POA: Diagnosis not present

## 2019-05-30 DIAGNOSIS — F32A Depression, unspecified: Secondary | ICD-10-CM

## 2019-05-30 LAB — POCT GLYCOSYLATED HEMOGLOBIN (HGB A1C): Hemoglobin A1C: 7 % — AB (ref 4.0–5.6)

## 2019-05-30 LAB — GLUCOSE, CAPILLARY: Glucose-Capillary: 122 mg/dL — ABNORMAL HIGH (ref 70–99)

## 2019-05-30 NOTE — Assessment & Plan Note (Addendum)
Patient with known bilateral carpal tunnel syndrome. She continues to have pain/numbness in her hands. She states that the pain is constant. She started to frequently drop items and has noticed difficulty with writing and doing buttons.   On physical exam there is bilateral thenar atrophy and mildly decreased grip strength.   A/P: - Encourage the use of nocturnal splints  - Will refer for surgical evaluation

## 2019-05-30 NOTE — Assessment & Plan Note (Signed)
Issue with uncontrolled hypertension. She is on amlodipine 10 mg daily, metoprolol tartrate 12.5 mg twice daily, and lisinopril 10 mg daily. She has taken all her medications today. She is tolerating them well without any apparent side effects. She states that she recently saw nephrology who plan to adjust her medications at her follow-up visit. She denies orthostatic symptoms.  A/P: - She will follow-up with nephrology for further management. - Continue amlodipine 10 mg once daily, metoprolol 12.5 mg twice daily, and lisinopril 10 mg once daily.

## 2019-05-30 NOTE — Assessment & Plan Note (Signed)
Patient with well-controlled type II diabetes. She is currently prescribed Degludec-Liraglutide 30 units QHS; however, states that she discontinue this medication as she was getting a reaction at the site of injection. Since that time she has been on Victoza 1.8 mg QD. She continues to practice carb counting and is exercising as much as possible. Her weight has been stable. She denies signs or symptoms of hypoglycemia. She did not bring in her CBG today.  Repeat A1c at 7.0.  A/P: - Asked the patient to clarify what medication she was on at home. She will call us back and let us know. - She is otherwise well controlled and will continue her current lifestyle modifications. - Continue lisinopril 10 mg once daily - Continue atorvastatin 20 mg once daily

## 2019-05-30 NOTE — Progress Notes (Deleted)
   CC: ***  HPI:  Ms.Stephannie C Mahajan is a 44 y.o.   Past Medical History:  Diagnosis Date  . Abdominal muscle pain 09/08/2016  . Abnormal Pap smear of cervix 2009  . Abscess    history of multiple abscesses  . Acute bilateral low back pain 02/13/2017  . Acute blood loss anemia   . Acute renal failure ([place]) 07/12/2012  . AKI (acute kidney injury) ([place])   . Anemia of chronic disease 2002  . Anxiety    Panic attacks  . Bilateral lower extremity edema 05/13/2016  . Bipolar disorder ([place])   . Cellulitis 05/21/2014   right eye  . Chronic bronchitis ([place])    "get it q yr" (05/13/2013)  . Chronic pain   . Depression   . Edema of lower extremity   . Endocarditis 2002   subacute bacterial endocarditis.   . Family history of anesthesia complication    "my mom has a hard time coming out from under"  . Fibromyalgia   . GERD (gastroesophageal reflux disease)    occ  . Heart murmur   . Herpes simplex type 1 infection 01/16/2018  . History of blood transfusion    "just low blood count" (05/13/2013)  . Hyperlipidemia   . Hypertension   . Hypothyroidism   . Hypothyroidism, adult 03/21/2014  . Leukocytosis   . Necrosis ([place])    and ulceration  . Necrotizing fasciitis s/p OR debridements 07/06/2012  . Obesity   . OSA on CPAP    does not wear CPAP  . Peripheral neuropathy   . Type II diabetes mellitus ([facility])    Type  II   Review of Systems:  ***  Physical Exam: Vitals:   05/30/19 1417 05/30/19 1424  BP: (!) 188/106 (!) 165/98  Pulse: 97 94  Temp: 98.6 F (37 C)   TempSrc: Oral   SpO2: 100%   Weight: 286 lb 9.6 oz (130 kg)   Height: 5\' 10"  (1.778 m)    ***  Assessment & Plan:   See Encounters Tab for problem based charting.  Patient {GC/GE:3044014::"discussed with","seen with"} Dr. {NAMES:[id number]::"Butcher","Guilloud","Hoffman","Mullen","Narendra","Raines","Vincent"}

## 2019-05-30 NOTE — Progress Notes (Signed)
   CC: DM, HTN, carpal tunnel, Anxiety/depression, s/p left below the knee amputation   HPI:  Ms.Stephannie C Hesler is a 44 y.o. female with PMHx listed below presenting for DM, HTN, carpal tunnel, Anxiety/depression, s/p left below the knee amputation. Please see the A&P for the status of the patient's chronic medical problems.  Past Medical History:  Diagnosis Date  . Abdominal muscle pain 09/08/2016  . Abnormal Pap smear of cervix 2009  . Abscess    history of multiple abscesses  . Acute bilateral low back pain 02/13/2017  . Acute blood loss anemia   . Acute renal failure ([place]) 07/12/2012  . AKI (acute kidney injury) ([place])   . Anemia of chronic disease 2002  . Anxiety    Panic attacks  . Bilateral lower extremity edema 05/13/2016  . Bipolar disorder ([place])   . Cellulitis 05/21/2014   right eye  . Chronic bronchitis ([place])    "get it q yr" (05/13/2013)  . Chronic pain   . Depression   . Edema of lower extremity   . Endocarditis 2002   subacute bacterial endocarditis.   . Family history of anesthesia complication    "my mom has a hard time coming out from under"  . Fibromyalgia   . GERD (gastroesophageal reflux disease)    occ  . Heart murmur   . Herpes simplex type 1 infection 01/16/2018  . History of blood transfusion    "just low blood count" (05/13/2013)  . Hyperlipidemia   . Hypertension   . Hypothyroidism   . Hypothyroidism, adult 03/21/2014  . Leukocytosis   . Necrosis ([place])    and ulceration  . Necrotizing fasciitis s/p OR debridements 07/06/2012  . Obesity   . OSA on CPAP    does not wear CPAP  . Peripheral neuropathy   . Type II diabetes mellitus ([place])    Type  II   Review of Systems:  Performed and all others negative.  Physical Exam: Vitals:   05/30/19 1417 05/30/19 1424  BP: (!) 188/106 (!) 165/98  Pulse: 97 94  Temp: 98.6 F (37 C)   TempSrc: Oral   SpO2: 100%   Weight: 286 lb 9.6 oz (130 kg)   Height: 5\' 10"  (1.778 m)    General: Well  nourished female in no acute distress Pulm: Good air movement with no wheezing or crackles  CV: RRR, no murmurs, no rubs   Assessment & Plan:   See Encounters Tab for problem based charting.  Patient discussed with Dr. Philipp Ovens

## 2019-05-30 NOTE — Patient Instructions (Addendum)
Thank you for allowing [place] to provide your care. Today we discussed the following:  1) Diabetes: you're doing a great job with your diabetes. Please keep up the good work. Give me a call and let me know what medication you are on at home.  2) High blood pressure: your blood pressure is a little high; however, since your kidney doctor has been managing your medications I will not make any adjustments today. Please call them to discuss how your medications can be changed.  3) Amputations: let me know if you need any further assistance with your prosthetic. I will fill out your paperwork this evening and we will get it sent off as soon as possible.  4) Depression: please schedule an appointment with Dessie Coma on your way out. If with counseling you feel your mood has not improven please call me and we can discuss adding another medication.  These come back to see me in three months or sooner if any issues arise.

## 2019-05-30 NOTE — Assessment & Plan Note (Signed)
Patient with worsening anxiety/depression. She has had difficulty with getting help at home which is added stress to her life. She now notes a labile mood. She states that she frequently cries and thinks she knows she is upset her. She is interested in getting a follow-up with Dessie Coma. She has continued her fluoxetine and amitriptyline as prescribed.  A/P: - Will follow-up with [facility]  - May need augmentation therapy

## 2019-05-30 NOTE — Assessment & Plan Note (Addendum)
Patient is status post left below the knee amputation. This causes significant stability in her life. Most days she needs help with ambulating, dressing, and meal preparation. She uses a cane to walk. She does not have anyone at home that can provide additional support. She lives with her nine-year-old daughter. She previously had a home health aide which she found very helpful.  She is on chronic oxycodone for pain control. She is due for refill. No red flags.  A/P: - Will fill out necessary paperwork for home health aide. - Will refill her oxycodone 5 mg, 1 to 2 tablets per day as needed (#45)

## 2019-05-31 ENCOUNTER — Telehealth: Payer: Self-pay | Admitting: *Deleted

## 2019-05-31 ENCOUNTER — Telehealth: Payer: Self-pay | Admitting: Internal Medicine

## 2019-05-31 DIAGNOSIS — E1121 Type 2 diabetes mellitus with diabetic nephropathy: Secondary | ICD-10-CM

## 2019-05-31 NOTE — Telephone Encounter (Signed)
RTC, pt states Dr. Tarri Abernethy wanted her to call and let him know how much Victoza she had been taking.  Pt states she was taking 1.8mg  once in the evening. Will forward to Dr. Tarri Abernethy. SChaplin, RN,BSN

## 2019-05-31 NOTE — Telephone Encounter (Signed)
Pt is requesting a call back from physician [phone]

## 2019-05-31 NOTE — Telephone Encounter (Signed)
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 Form completed by PCP and faxed along with yesterday's OV note to [facility] at [phone]. Hubbard Hartshorn, BSN, RN-BC

## 2019-06-03 MED ORDER — LIRAGLUTIDE 18 MG/3ML ~~LOC~~ SOPN: 1.8000 mg | PEN_INJECTOR | Freq: Every day | SUBCUTANEOUS | 3 refills | Status: DC

## 2019-06-03 NOTE — Progress Notes (Signed)
Internal Medicine Clinic Attending  Case discussed with Dr. Helberg at the time of the visit.  We reviewed the resident's history and exam and pertinent patient test results.  I agree with the assessment, diagnosis, and plan of care documented in the resident's note.    

## 2019-06-06 ENCOUNTER — Encounter: Payer: Self-pay | Admitting: Internal Medicine

## 2019-06-06 ENCOUNTER — Telehealth: Payer: Self-pay | Admitting: Licensed Clinical Social Worker

## 2019-06-06 ENCOUNTER — Ambulatory Visit: Payer: Medicare Other | Admitting: Licensed Clinical Social Worker

## 2019-06-06 NOTE — Telephone Encounter (Signed)
Patient was called twice for her scheduled appointment. Patient did not answer, and no vm was available.

## 2019-06-14 ENCOUNTER — Ambulatory Visit (INDEPENDENT_AMBULATORY_CARE_PROVIDER_SITE_OTHER): Payer: Medicare Other | Admitting: Orthopaedic Surgery

## 2019-06-14 ENCOUNTER — Encounter: Payer: Self-pay | Admitting: Orthopaedic Surgery

## 2019-06-14 ENCOUNTER — Other Ambulatory Visit: Payer: Self-pay

## 2019-06-14 DIAGNOSIS — G5602 Carpal tunnel syndrome, left upper limb: Secondary | ICD-10-CM

## 2019-06-14 DIAGNOSIS — G5601 Carpal tunnel syndrome, right upper limb: Secondary | ICD-10-CM | POA: Diagnosis not present

## 2019-06-14 NOTE — Progress Notes (Signed)
Office Visit Note   Patient: [name] [name]           Date of Birth: 1976-03-12           MRN: [id number] Visit Date: 06/14/2019              Requested by: [name] Ochs, MD [address] [place],  [place] [address] PCP: [name] Homes, MD   Assessment & Plan: Visit Diagnoses:  1. Right carpal tunnel syndrome   2. Left carpal tunnel syndrome     Plan: Impression is severe bilateral carpal tunnel syndrome.  We will obtain nerve conduction studies ASAP.  I will call the patient with the results and likely discuss surgical treatment.  Follow-Up Instructions: Return if symptoms worsen or fail to improve.   Orders:  No orders of the defined types were placed in this encounter.  No orders of the defined types were placed in this encounter.     Procedures: No procedures performed   Clinical Data: No additional findings.   Subjective: Chief Complaint  Patient presents with  . Right Hand - Pain  . Left Hand - Pain    [name] [name] is a 44 year old female comes in for evaluation of suspected bilateral carpal tunnel syndrome worse on the right.  She endorses constant tingling numbness burning cramping pain in her hands and fingers.  She states that she had nerve conduction studies about 5 years ago which confirmed carpal tunnel syndrome.  She has tried wearing braces at night which do not help and actually make it worse.  She is starting to notice weakness in her hand and clawing and muscle atrophy.  She takes Percocet and Lyrica for chronic pain.  Last A1c was 7.0.   Review of Systems  Constitutional: Negative.   HENT: Negative.   Eyes: Negative.   Respiratory: Negative.   Cardiovascular: Negative.   Endocrine: Negative.   Musculoskeletal: Negative.   Neurological: Negative.   Hematological: Negative.   Psychiatric/Behavioral: Negative.   All other systems reviewed and are negative.    Objective: Vital Signs: There were no vitals taken for this  visit.  Physical Exam Vitals and nursing note reviewed.  Constitutional:      Appearance: She is well-developed.  Pulmonary:     Effort: Pulmonary effort is normal.  Skin:    General: Skin is warm.     Capillary Refill: Capillary refill takes less than 2 seconds.  Neurological:     Mental Status: She is alert and oriented to person, place, and time.  Psychiatric:        Behavior: Behavior normal.        Thought Content: Thought content normal.        Judgment: Judgment normal.     Ortho Exam Follow-up hand exam shows thenar atrophy and mild clawing of all fingers.  Negative carpal tunnel compressive signs. Specialty Comments:  No specialty comments available.  Imaging: No results found.   PMFS History: Patient Active Problem List   Diagnosis Date Noted  . Right carpal tunnel syndrome 06/14/2019  . Left carpal tunnel syndrome 06/14/2019  . Tobacco use 03/01/2019  . Phantom limb pain ([place] [place]) 11/08/2018  . Current moderate episode of major depressive disorder ([place])   . Anemia of chronic disease   . Severe protein-calorie malnutrition ([place])   . Status post below knee amputation, left ([place]) 04/11/2017  . Bipolar affective disorder ([place])   . Adjustment disorder with depressed mood 03/26/2017  . Heart failure with preserved  ejection fraction ([place]) 02/09/2017  . Routine adult health maintenance 12/08/2016  . Chronic pain of right knee 09/08/2016  . Chronic kidney disease (CKD), stage III (moderate) 07/15/2016  . OSA (obstructive sleep apnea) 05/13/2016  . Morbid obesity due to excess calories ([place]) 11/27/2015  . Hypothyroidism 04/25/2013  . Controlled type 2 diabetes mellitus ([place]) 07/12/2012  . Carpal tunnel syndrome, bilateral 01/25/2012  . Diabetic peripheral neuropathy ([place]) 07/04/2011  . Anxiety and depression 06/02/2011  . GERD 12/06/2007  . Hyperlipidemia 08/29/2007  . Hypertension associated with diabetes ([place]) 08/29/2007   Past Medical History:  Diagnosis Date   . Abdominal muscle pain 09/08/2016  . Abnormal Pap smear of cervix 2009  . Abscess    history of multiple abscesses  . Acute bilateral low back pain 02/13/2017  . Acute blood loss anemia   . Acute renal failure ([place]) 07/12/2012  . AKI (acute kidney injury) ([place])   . Anemia of chronic disease 2002  . Anxiety    Panic attacks  . Bilateral lower extremity edema 05/13/2016  . Bipolar disorder ([place])   . Cellulitis 05/21/2014   right eye  . Chronic bronchitis ([place])    "get it q yr" (05/13/2013)  . Chronic pain   . Depression   . Edema of lower extremity   . Endocarditis 2002   subacute bacterial endocarditis.   . Family history of anesthesia complication    "my mom has a hard time coming out from under"  . Fibromyalgia   . GERD (gastroesophageal reflux disease)    occ  . Heart murmur   . Herpes simplex type 1 infection 01/16/2018  . History of blood transfusion    "just low blood count" (05/13/2013)  . Hyperlipidemia   . Hypertension   . Hypothyroidism   . Hypothyroidism, adult 03/21/2014  . Leukocytosis   . Necrosis ([place])    and ulceration  . Necrotizing fasciitis s/p OR debridements 07/06/2012  . Obesity   . OSA on CPAP    does not wear CPAP  . Peripheral neuropathy   . Type II diabetes mellitus (HCC)    Type  II    Family History  Problem Relation Age of Onset  . Heart failure Mother   . Diabetes Mother   . Kidney disease Mother   . Kidney disease Father   . Diabetes Father   . Diabetes Paternal Grandmother   . Heart failure Paternal Grandmother   . Other Other     Past Surgical History:  Procedure Laterality Date  . ABDOMINAL AORTOGRAM W/LOWER EXTREMITY Left 03/19/2019   Procedure: ABDOMINAL AORTOGRAM W/LOWER EXTREMITY;  Surgeon: Serafina Mitchell, MD;  Location: [facility];  Service: Cardiovascular;  Laterality: Left;  . AMPUTATION Left 04/07/2017   Procedure: LEFT BELOW KNEE AMPUTATION;  Surgeon: Newt Minion, MD;  Location: [facility];  Service:  Orthopedics;  Laterality: Left;  . EYE SURGERY     lazer  . INCISION AND DRAINAGE ABSCESS     multiple I&Ds  . INCISION AND DRAINAGE ABSCESS Left 07/09/2012   Procedure: DRESSING CHANGE, THIGH WOUND;  Surgeon: Harl Bowie, MD;  Location: [facility];  Service: General;  Laterality: Left;  . INCISION AND DRAINAGE OF WOUND Left 07/07/2012   Procedure: IRRIGATION AND DEBRIDEMENT WOUND;  Surgeon: Harl Bowie, MD;  Location: [facility];  Service: General;  Laterality: Left;  . INCISION AND DRAINAGE PERIRECTAL ABSCESS Left 07/14/2012   Procedure: DEBRIDEMENT OF SKIN & SOFT TISSUE; DRESSING CHANGE UNDER  ANESTHESIA;  Surgeon: Gayland Curry, MD,FACS;  Location: [facility];  Service: General;  Laterality: Left;  . INCISION AND DRAINAGE PERIRECTAL ABSCESS Left 07/16/2012   Procedure: I&D Left Thigh;  Surgeon: Gwenyth Ober, MD;  Location: [facility];  Service: General;  Laterality: Left;  . INCISION AND DRAINAGE PERIRECTAL ABSCESS N/A 01/05/2015   Procedure: IRRIGATION AND DEBRIDEMENT PERIRECTAL ABSCESS;  Surgeon: Donnie Mesa, MD;  Location: [facility];  Service: General;  Laterality: N/A;  . IRRIGATION AND DEBRIDEMENT ABSCESS Left 07/06/2012   Procedure: IRRIGATION AND DEBRIDEMENT ABSCESS BUTTOCKS AND THIGH;  Surgeon: Shann Medal, MD;  Location: [facility];  Service: General;  Laterality: Left;  . IRRIGATION AND DEBRIDEMENT ABSCESS Left 08/10/2012   Procedure: IRRIGATION AND DEBRIDEMENT ABSCESS;  Surgeon: Madilyn Hook, DO;  Location: [facility];  Service: General;  Laterality: Left;  . STUMP REVISION Left 06/09/2017   Procedure: REVISION LEFT BELOW KNEE AMPUTATION;  Surgeon: Newt Minion, MD;  Location: [facility];  Service: Orthopedics;  Laterality: Left;  . TONSILLECTOMY  1994   Social History   Occupational History  . Occupation: disability  Tobacco Use  . Smoking status: Current Every Day Smoker    Packs/day: 0.50    Years: 16.00    Pack years: 8.00    Types: Cigarettes  . Smokeless tobacco: Never Used  . Tobacco  comment: 1 PPD  Substance and Sexual Activity  . Alcohol use: Yes    Alcohol/week: 0.0 standard drinks    Comment: Socially-" monthy  maybe"  . Drug use: No  . Sexual activity: Yes

## 2019-06-14 NOTE — Addendum Note (Signed)
Addended by: Precious Bard on: 06/14/2019 10:10 AM   Modules accepted: Orders

## 2019-06-24 ENCOUNTER — Encounter: Payer: Self-pay | Admitting: Orthopedic Surgery

## 2019-06-24 ENCOUNTER — Ambulatory Visit (INDEPENDENT_AMBULATORY_CARE_PROVIDER_SITE_OTHER): Payer: Medicare HMO | Admitting: Orthopedic Surgery

## 2019-06-24 ENCOUNTER — Other Ambulatory Visit: Payer: Self-pay

## 2019-06-24 VITALS — Ht 70.0 in | Wt 286.0 lb

## 2019-06-24 DIAGNOSIS — G8929 Other chronic pain: Secondary | ICD-10-CM | POA: Diagnosis not present

## 2019-06-24 DIAGNOSIS — M25561 Pain in right knee: Secondary | ICD-10-CM | POA: Diagnosis not present

## 2019-06-24 DIAGNOSIS — M25562 Pain in left knee: Secondary | ICD-10-CM

## 2019-06-24 MED ORDER — LIDOCAINE HCL 1 % IJ SOLN
5.0000 mL | INTRAMUSCULAR | Status: AC | PRN
  Administered 2019-06-24: 5 mL

## 2019-06-24 MED ORDER — TERBINAFINE HCL 250 MG PO TABS: 250.0000 mg | ORAL_TABLET | Freq: Every day | ORAL | 0 refills | Status: DC

## 2019-06-24 MED ORDER — METHYLPREDNISOLONE ACETATE 40 MG/ML IJ SUSP
40.0000 mg | INTRAMUSCULAR | Status: AC | PRN
  Administered 2019-06-24: 40 mg via INTRA_ARTICULAR

## 2019-06-24 NOTE — Progress Notes (Signed)
Office Visit Note   Patient: [name] [name]           Date of Birth: 11/22/1975           MRN: [id number] Visit Date: 06/24/2019              Requested by: Ina Homes, MD [address] [place],  [place] [address] PCP: Ina Homes, MD  Chief Complaint  Patient presents with  . Left Leg - Pain    BKA  . Right Knee - Pain      HPI: This is a pleasant 44 year old woman who is approximately 2 years status post left below-knee amputation and revision.  She is here for a few reasons.  1 she is continuing to struggle with fit on her prosthetic and has several socks up to 19 ply.  She needs a prescription for a new liner and supplies.  Secondly she is asking for injection into her knees that she has had in the past bilaterally and done well.  She is also had chronic drainage that comes and goes on the end of her amputation stump.  She has tried some topical antifungal but this does not seem to improve it much  Assessment & Plan: Visit Diagnoses: No diagnosis found.  Plan: She will follow up in 2 weeks.  We have asked is much as possible for her to refrain from wearing her prosthetic.  I will call in an antifungal medication hopefully this will help  Follow-Up Instructions: No follow-ups on file.   Ortho Exam  Patient is alert, oriented, no adenopathy, well-dressed, normal affect, normal respiratory effort. Focused examination of her left below-knee amputation stump chronic changes at the end of the stump no cellulitis but findings consistent with a fungal infection.  With chronic drainage there is no frank open area right knee no effusion pain with range of motion left knee some pain with range of motion no effusion or surrounding cellulitis  Imaging: No results found. No images are attached to the encounter.  Labs: Lab Results  Component Value Date   HGBA1C 7.0 (A) 05/30/2019   HGBA1C 6.6 (A) 02/28/2019   HGBA1C 7.0 (A) 11/08/2018   ESRSEDRATE 130 (H) 03/24/2017   ESRSEDRATE 53 (H) 09/14/2015   ESRSEDRATE 132 (H) 07/06/2012   CRP 13.6 (H) 02/14/2019   CRP 38.6 (H) 04/03/2018   CRP 5.4 (H) 09/14/2015   LABURIC 8.3 (H) 04/15/2019   REPTSTATUS 02/17/2019 FINAL 02/12/2019   REPTSTATUS 02/17/2019 FINAL 02/12/2019   GRAMSTAIN  08/11/2017    MODERATE WBC PRESENT, PREDOMINANTLY PMN FEW GRAM POSITIVE COCCI    CULT  02/12/2019    NO GROWTH 5 DAYS Performed at [facility], [place] [address] [place], [place] [address]    CULT  02/12/2019    NO GROWTH 5 DAYS Performed at [facility] [address] [place], [place] [address]    LABORGA GROUP B STREP(S.AGALACTIAE)ISOLATED 04/03/2018     Lab Results  Component Value Date   ALBUMIN 2.1 (L) 02/12/2019   ALBUMIN 2.8 (L) 12/18/2018   ALBUMIN <1.0 (L) 04/11/2018   LABURIC 8.3 (H) 04/15/2019    Lab Results  Component Value Date   MG 2.3 04/07/2018   MG 2.1 04/05/2018   MG 2.4 03/25/2017   No results found for: VD25OH  No results found for: PREALBUMIN CBC EXTENDED Latest Ref Rng & Units 04/12/2019 03/19/2019 03/05/2019  WBC 4.0 - 10.5 K/uL 5.6 - -  RBC 3.87 -  5.11 MIL/uL 3.21(L) - -  HGB 12.0 - 15.0 g/dL 8.4(L) 9.9(L) 11.6(L)  HCT 36.0 - 46.0 % 26.5(L) 29.0(L) 34.0(L)  PLT 150 - 400 K/uL 259 - -  NEUTROABS 1.7 - 7.7 K/uL - - -  LYMPHSABS 0.7 - 4.0 K/uL - - -     Body mass index is 41.04 kg/m.  Orders:  No orders of the defined types were placed in this encounter.  Meds ordered this encounter  Medications  . terbinafine (LAMISIL) 250 MG tablet    Sig: Take 1 tablet (250 mg total) by mouth daily.    Dispense:  14 tablet    Refill:  0     Procedures: Large Joint Inj: bilateral knee on 06/24/2019 9:24 AM Indications: pain and diagnostic evaluation Details: 22 G 1.5 in needle, anterolateral approach  Arthrogram: No  Medications (Right): 5 mL lidocaine 1 %; 40 mg methylPREDNISolone acetate 40 MG/ML Medications (Left): 5 mL lidocaine 1 %; 40 mg methylPREDNISolone acetate  40 MG/ML Outcome: tolerated well, no immediate complications Procedure, treatment alternatives, risks and benefits explained, specific risks discussed. Consent was given by the patient. Immediately prior to procedure a time out was called to verify the correct patient, procedure, equipment, support staff and site/side marked as required. Patient was prepped and draped in the usual sterile fashion.      Clinical Data: No additional findings.  ROS:  All other systems negative, except as noted in the HPI. Review of Systems  Objective: Vital Signs: Ht 5\' 10"  (1.778 m)   Wt 286 lb (129.7 kg)   BMI 41.04 kg/m   Specialty Comments:  No specialty comments available.  PMFS History: Patient Active Problem List   Diagnosis Date Noted  . Right carpal tunnel syndrome 06/14/2019  . Left carpal tunnel syndrome 06/14/2019  . Tobacco use 03/01/2019  . Phantom limb pain ([place]) 11/08/2018  . Current moderate episode of major depressive disorder ([place])   . Anemia of chronic disease   . Severe protein-calorie malnutrition ([place])   . Status post below knee amputation, left ([place]) 04/11/2017  . Bipolar affective disorder ([place])   . Adjustment disorder with depressed mood 03/26/2017  . Heart failure with preserved ejection fraction ([place]) 02/09/2017  . Routine adult health maintenance 12/08/2016  . Chronic pain of right knee 09/08/2016  . Chronic kidney disease (CKD), stage III (moderate) 07/15/2016  . OSA (obstructive sleep apnea) 05/13/2016  . Morbid obesity due to excess calories ([place]) 11/27/2015  . Hypothyroidism 04/25/2013  . Controlled type 2 diabetes mellitus ([place]) 07/12/2012  . Carpal tunnel syndrome, bilateral 01/25/2012  . Diabetic peripheral neuropathy ([place]) 07/04/2011  . Anxiety and depression 06/02/2011  . GERD 12/06/2007  . Hyperlipidemia 08/29/2007  . Hypertension associated with diabetes ([place]) 08/29/2007   Past Medical History:  Diagnosis Date  . Abdominal muscle pain  09/08/2016  . Abnormal Pap smear of cervix 2009  . Abscess    history of multiple abscesses  . Acute bilateral low back pain 02/13/2017  . Acute blood loss anemia   . Acute renal failure ([place]) 07/12/2012  . AKI (acute kidney injury) ([place])   . Anemia of chronic disease 2002  . Anxiety    Panic attacks  . Bilateral lower extremity edema 05/13/2016  . Bipolar disorder ([place])   . Cellulitis 05/21/2014   right eye  . Chronic bronchitis ([place])    "get it q yr" (05/13/2013)  . Chronic pain   . Depression   . Edema of lower extremity   .  Endocarditis 2002   subacute bacterial endocarditis.   . Family history of anesthesia complication    "my mom has a hard time coming out from under"  . Fibromyalgia   . GERD (gastroesophageal reflux disease)    occ  . Heart murmur   . Herpes simplex type 1 infection 01/16/2018  . History of blood transfusion    "just low blood count" (05/13/2013)  . Hyperlipidemia   . Hypertension   . Hypothyroidism   . Hypothyroidism, adult 03/21/2014  . Leukocytosis   . Necrosis ([place])    and ulceration  . Necrotizing fasciitis s/p OR debridements 07/06/2012  . Obesity   . OSA on CPAP    does not wear CPAP  . Peripheral neuropathy   . Type II diabetes mellitus (HCC)    Type  II    Family History  Problem Relation Age of Onset  . Heart failure Mother   . Diabetes Mother   . Kidney disease Mother   . Kidney disease Father   . Diabetes Father   . Diabetes Paternal Grandmother   . Heart failure Paternal Grandmother   . Other Other     Past Surgical History:  Procedure Laterality Date  . ABDOMINAL AORTOGRAM W/LOWER EXTREMITY Left 03/19/2019   Procedure: ABDOMINAL AORTOGRAM W/LOWER EXTREMITY;  Surgeon: Serafina Mitchell, MD;  Location: [facility];  Service: Cardiovascular;  Laterality: Left;  . AMPUTATION Left 04/07/2017   Procedure: LEFT BELOW KNEE AMPUTATION;  Surgeon: Newt Minion, MD;  Location: [facility];  Service: Orthopedics;  Laterality: Left;   . EYE SURGERY     lazer  . INCISION AND DRAINAGE ABSCESS     multiple I&Ds  . INCISION AND DRAINAGE ABSCESS Left 07/09/2012   Procedure: DRESSING CHANGE, THIGH WOUND;  Surgeon: Harl Bowie, MD;  Location: [facility];  Service: General;  Laterality: Left;  . INCISION AND DRAINAGE OF WOUND Left 07/07/2012   Procedure: IRRIGATION AND DEBRIDEMENT WOUND;  Surgeon: Harl Bowie, MD;  Location: [facility];  Service: General;  Laterality: Left;  . INCISION AND DRAINAGE PERIRECTAL ABSCESS Left 07/14/2012   Procedure: DEBRIDEMENT OF SKIN & SOFT TISSUE; DRESSING CHANGE UNDER ANESTHESIA;  Surgeon: Gayland Curry, MD,FACS;  Location: [facility];  Service: General;  Laterality: Left;  . INCISION AND DRAINAGE PERIRECTAL ABSCESS Left 07/16/2012   Procedure: I&D Left Thigh;  Surgeon: Gwenyth Ober, MD;  Location: [facility];  Service: General;  Laterality: Left;  . INCISION AND DRAINAGE PERIRECTAL ABSCESS N/A 01/05/2015   Procedure: IRRIGATION AND DEBRIDEMENT PERIRECTAL ABSCESS;  Surgeon: Donnie Mesa, MD;  Location: [facility];  Service: General;  Laterality: N/A;  . IRRIGATION AND DEBRIDEMENT ABSCESS Left 07/06/2012   Procedure: IRRIGATION AND DEBRIDEMENT ABSCESS BUTTOCKS AND THIGH;  Surgeon: Shann Medal, MD;  Location: [facility];  Service: General;  Laterality: Left;  . IRRIGATION AND DEBRIDEMENT ABSCESS Left 08/10/2012   Procedure: IRRIGATION AND DEBRIDEMENT ABSCESS;  Surgeon: Madilyn Hook, DO;  Location: [facility];  Service: General;  Laterality: Left;  . STUMP REVISION Left 06/09/2017   Procedure: REVISION LEFT BELOW KNEE AMPUTATION;  Surgeon: Newt Minion, MD;  Location: [facility];  Service: Orthopedics;  Laterality: Left;  . TONSILLECTOMY  1994   Social History   Occupational History  . Occupation: disability  Tobacco Use  . Smoking status: Current Every Day Smoker    Packs/day: 0.50    Years: 16.00    Pack years: 8.00    Types: Cigarettes  .  Smokeless tobacco: Never Used  . Tobacco comment: 1 PPD  Substance and  Sexual Activity  . Alcohol use: Yes    Alcohol/week: 0.0 standard drinks    Comment: Socially-" monthy  maybe"  . Drug use: No  . Sexual activity: Yes

## 2019-06-25 ENCOUNTER — Other Ambulatory Visit: Payer: Self-pay | Admitting: Internal Medicine

## 2019-06-25 DIAGNOSIS — Z89512 Acquired absence of left leg below knee: Secondary | ICD-10-CM

## 2019-06-26 ENCOUNTER — Telehealth: Payer: Self-pay | Admitting: *Deleted

## 2019-06-26 NOTE — Telephone Encounter (Addendum)
Information was sent through CoverMyMeds for PA for  [employer].  Awaiting determination between 3-7 days. Sander Nephew, RN 06/26/2019 4:14 PM. Fax PA for [employer] has been approved 06/26/2019 until 05/22/2020.  Sander Nephew, RN 07/04/2019 10:57 AM.

## 2019-07-03 DIAGNOSIS — I739 Peripheral vascular disease, unspecified: Secondary | ICD-10-CM | POA: Diagnosis not present

## 2019-07-03 DIAGNOSIS — E1122 Type 2 diabetes mellitus with diabetic chronic kidney disease: Secondary | ICD-10-CM | POA: Diagnosis not present

## 2019-07-03 DIAGNOSIS — M109 Gout, unspecified: Secondary | ICD-10-CM | POA: Diagnosis not present

## 2019-07-03 DIAGNOSIS — I5032 Chronic diastolic (congestive) heart failure: Secondary | ICD-10-CM | POA: Diagnosis not present

## 2019-07-03 DIAGNOSIS — Z720 Tobacco use: Secondary | ICD-10-CM | POA: Diagnosis not present

## 2019-07-03 DIAGNOSIS — D631 Anemia in chronic kidney disease: Secondary | ICD-10-CM | POA: Diagnosis not present

## 2019-07-03 DIAGNOSIS — N189 Chronic kidney disease, unspecified: Secondary | ICD-10-CM | POA: Diagnosis not present

## 2019-07-03 DIAGNOSIS — E1129 Type 2 diabetes mellitus with other diabetic kidney complication: Secondary | ICD-10-CM | POA: Diagnosis not present

## 2019-07-03 DIAGNOSIS — N179 Acute kidney failure, unspecified: Secondary | ICD-10-CM | POA: Diagnosis not present

## 2019-07-03 DIAGNOSIS — I129 Hypertensive chronic kidney disease with stage 1 through stage 4 chronic kidney disease, or unspecified chronic kidney disease: Secondary | ICD-10-CM | POA: Diagnosis not present

## 2019-07-03 DIAGNOSIS — N1832 Chronic kidney disease, stage 3b: Secondary | ICD-10-CM | POA: Diagnosis not present

## 2019-07-05 ENCOUNTER — Ambulatory Visit (INDEPENDENT_AMBULATORY_CARE_PROVIDER_SITE_OTHER): Payer: Medicare HMO | Admitting: Physical Medicine and Rehabilitation

## 2019-07-05 ENCOUNTER — Other Ambulatory Visit: Payer: Self-pay

## 2019-07-05 ENCOUNTER — Encounter: Payer: Self-pay | Admitting: Physical Medicine and Rehabilitation

## 2019-07-05 DIAGNOSIS — R202 Paresthesia of skin: Secondary | ICD-10-CM | POA: Diagnosis not present

## 2019-07-05 DIAGNOSIS — R531 Weakness: Secondary | ICD-10-CM

## 2019-07-05 NOTE — Progress Notes (Signed)
  Numeric Pain Rating Scale and Functional Assessment Average Pain 8   In the last MONTH (on 0-10 scale) has pain interfered with the following?  1. General activity like being  able to carry out your everyday physical activities such as walking, climbing stairs, carrying groceries, or moving a chair?  Rating(10)    

## 2019-07-09 NOTE — Procedures (Signed)
EMG & NCV Findings: Evaluation of the left median motor and the right median motor nerves showed prolonged distal onset latency (L13.8, R6.3 ms), reduced amplitude (L0.7, R0.1 mV), and decreased conduction velocity (Elbow-Wrist, L30, R39 m/s).  The left ulnar motor and the right ulnar motor nerves showed prolonged distal onset latency (L4.9, R11.0 ms), reduced amplitude (L1.5, R0.2 mV), decreased conduction velocity (B Elbow-Wrist, L19, R21 m/s), and decreased conduction velocity (A Elbow-B Elbow, L18, R34 m/s).  The left median (across palm) sensory nerve showed no response (Wrist) and no response (Palm).  The right median (across palm) sensory nerve showed no response (Wrist) and prolonged distal peak latency (Palm, 5.3 ms).  The left radial sensory and the left ulnar sensory nerves showed no response (Wrist).  The right radial sensory nerve showed prolonged distal peak latency (7.7 ms).  The right ulnar sensory nerve showed prolonged distal peak latency (4.6 ms), reduced amplitude (2.6 V), and decreased conduction velocity (Wrist-5th Digit, 30 m/s).  Left vs. Right side comparison data for the median motor nerve indicates abnormal L-R latency difference (7.5 ms) and abnormal L-R amplitude difference (85.7 %).  The ulnar motor nerve indicates abnormal L-R latency difference (6.1 ms) and abnormal L-R amplitude difference (86.7 %).    Needle evaluation of the right abductor pollicis brevis and the right first dorsal interosseous muscles showed decreased insertional activity, increased spontaneous activity, and diminished recruitment.  All remaining muscles (as indicated in the following table) showed no evidence of electrical instability.    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  Very similar findings to Dr. Narda Amber study in 2018 (listed below in this note).  **There  is no evidence of cervical radiculopathy but underlying focal nerve entrapment is impossible to distinguish from the severe polyneuropathy.  Recommendations: 1.  Follow-up with referring physician. 2.  Continue current management of symptoms.  ___________________________ Laurence Spates FAAPMR Board Certified, American Board of Physical Medicine and Rehabilitation    Nerve Conduction Studies Anti Sensory Summary Table   Stim Site NR Peak (ms) Norm Peak (ms) P-T Amp (V) Norm P-T Amp Site1 Site2 Delta-P (ms) Dist (cm) Vel (m/s) Norm Vel (m/s)  Left Median Acr Palm Anti Sensory (2nd Digit)  30C  Wrist *NR  <3.6  >10 Wrist Palm  0.0    Palm *NR  <2.0          Right Median Acr Palm Anti Sensory (2nd Digit)  28.6C  Wrist *NR  <3.6  >10 Wrist Palm  0.0    Palm    *5.3 <2.0 4.3         Left Radial Anti Sensory (Base 1st Digit)  30.5C  Wrist *NR  <3.1   Wrist Base 1st Digit  0.0    Right Radial Anti Sensory (Base 1st Digit)  29.1C  Wrist    *7.7 <3.1 4.2  Wrist Base 1st Digit 7.7 0.0    Left Ulnar Anti Sensory (5th Digit)  30.1C  Wrist *NR  <3.7  >15.0 Wrist 5th Digit  14.0  >38  Right Ulnar Anti Sensory (5th Digit)  29.1C  Wrist    *4.6 <3.7 *2.6 >15.0 Wrist 5th Digit 4.6 14.0 *30 >38   Motor Summary Table   Stim Site NR Onset (ms) Norm Onset (ms) O-P Amp (mV) Norm O-P Amp Site1 Site2 Delta-0 (ms) Dist (cm) Vel (m/s) Norm Vel (m/s)  Left Median Motor (  Abd Poll Brev)  30.6C  Wrist    *13.8 <4.2 *0.7 >5 Elbow Wrist 7.2 21.5 *30 >50  Elbow    21.0  0.6         Right Median Motor (Abd Poll Brev)  29.4C  Wrist    *6.3 <4.2 *0.1 >5 Elbow Wrist 5.9 23.0 *39 >50  Elbow    12.2  0.1         Left Ulnar Motor (Abd Dig Min)  30.4C  Wrist    *4.9 <4.2 *1.5 >3 B Elbow Wrist 12.2 23.0 *19 >53  B Elbow    17.1  0.3  A Elbow B Elbow 5.6 10.0 *18 >53  A Elbow    22.7  1.2         Right Ulnar Motor (Abd Dig Min)  29.5C  Wrist    *11.0 <4.2 *0.2 >3 B Elbow Wrist 11.2 24.0 *21 >53  B Elbow     22.2  1.1  A Elbow B Elbow 2.9 10.0 *34 >53  A Elbow    25.1  0.9          EMG   Side Muscle Nerve Root Ins Act Fibs Psw Amp Dur Poly Recrt Int Fraser Din Comment  Right Abd Poll Brev Median C8-T1 *Decr *3+ *3+ Nml Nml 0 *Reduced Nml   Right 1stDorInt Ulnar C8-T1 *Decr *3+ *3+ Nml Nml 0 *Reduced Nml   Right PronatorTeres Median C6-7 Nml Nml Nml Nml Nml 0 Nml Nml   Right Biceps Musculocut C5-6 Nml Nml Nml Nml Nml 0 Nml Nml   Right Deltoid Axillary C5-6 Nml Nml Nml Nml Nml 0 Nml Nml     Nerve Conduction Studies Anti Sensory Left/Right Comparison   Stim Site L Lat (ms) R Lat (ms) L-R Lat (ms) L Amp (V) R Amp (V) L-R Amp (%) Site1 Site2 L Vel (m/s) R Vel (m/s) L-R Vel (m/s)  Median Acr Palm Anti Sensory (2nd Digit)  30C  Wrist       Wrist Palm     Palm  *5.3   4.3        Radial Anti Sensory (Base 1st Digit)  30.5C  Wrist  *7.7   4.2  Wrist Base 1st Digit     Ulnar Anti Sensory (5th Digit)  30.1C  Wrist  *4.6   *2.6  Wrist 5th Digit  30    Motor Left/Right Comparison   Stim Site L Lat (ms) R Lat (ms) L-R Lat (ms) L Amp (mV) R Amp (mV) L-R Amp (%) Site1 Site2 L Vel (m/s) R Vel (m/s) L-R Vel (m/s)  Median Motor (Abd Poll Brev)  30.6C  Wrist *13.8 *6.3 *7.5 *0.7 *0.1 *85.7 Elbow Wrist *30 *39 9  Elbow 21.0 12.2 8.8 0.6 0.1 83.3       Ulnar Motor (Abd Dig Min)  30.4C  Wrist *4.9 *11.0 *6.1 *1.5 *0.2 *86.7 B Elbow Wrist *19 *21 2  B Elbow 17.1 22.2 5.1 0.3 1.1 72.7 A Elbow B Elbow *18 *34 16  A Elbow 22.7 25.1 2.4 1.2 0.9 25.0          Waveforms:

## 2019-07-09 NOTE — Progress Notes (Signed)
[name] [name] - 44 y.o. female MRN [id number]  Date of birth: Jan 02, 1976  Office Visit Note: Visit Date: 07/05/2019 PCP: Ina Homes, MD Referred by: Ina Homes, MD  Subjective: Chief Complaint  Patient presents with  . Right Hand - Numbness, Other  . Left Hand - Numbness, Other   HPI: [name] [name] is a 44 y.o. female who comes in today At the request of Dr. Eduard Roux for electrodiagnostic studies of the upper limbs.  He specifically was looking for median neuropathy or carpal tunnel syndrome.  Patient is right-hand dominant and reports 8 out of 10 pain but with 10 out of 10 impairment with daily activities due to weakness.  She reports that her fingers are curling in both hands right worse than left and she can really cannot hold objects or do much with her hands.  She reports generalized global numbness burning and stinging.  She has difficulty buttoning close and really using her hands at all.  She has noted that she has been diagnosed with carpal tunnel syndrome before.  As of note the patient does have diagnoses on the chart of type 2 diabetes with last hemoglobin Y8F of 7.0 and complications of diabetic peripheral neuropathy, left below-knee amputation and renal disease.  Her case is also complicated by heart failure and bipolar affective disorder as well as panic attacks.  She also carries a diagnosis of fibromyalgia.  She had prior electrodiagnostic study in 2018 which is reviewed below and in the chart.  This was done by Dr. Narda Amber at [facility] neurology.  This showed a severe sensorimotor axonal polyneuropathy of both upper limbs.  ROS Otherwise per HPI.  Assessment & Plan: Visit Diagnoses:  1. Paresthesia of skin   2. Weakness     Plan: Impression: The above electrodiagnostic study is ABNORMAL and reveals evidence of a severe chronic sensorimotor axonal polyneuropathy in both upper limbs likely from diabetes.  Very similar findings to Dr. Narda Amber study in 2018 (listed below in this note).  **There is no evidence of cervical radiculopathy but underlying focal nerve entrapment is impossible to distinguish from the severe polyneuropathy.  Recommendations: 1.  Follow-up with referring physician. 2.  Continue current management of symptoms.  Meds & Orders: No orders of the defined types were placed in this encounter.   Orders Placed This Encounter  Procedures  . NCV with EMG (electromyography)    Follow-up: Return in about 2 weeks (around 07/19/2019) for  Eduard Roux, M.D..   Procedures: No procedures performed  EMG & NCV Findings: Evaluation of the left median motor and the right median motor nerves showed prolonged distal onset latency (L13.8, R6.3 ms), reduced amplitude (L0.7, R0.1 mV), and decreased conduction velocity (Elbow-Wrist, L30, R39 m/s).  The left ulnar motor and the right ulnar motor nerves showed prolonged distal onset latency (L4.9, R11.0 ms), reduced amplitude (L1.5, R0.2 mV), decreased conduction velocity (B Elbow-Wrist, L19, R21 m/s), and decreased conduction velocity (A Elbow-B Elbow, L18, R34 m/s).  The left median (across palm) sensory nerve showed no response (Wrist) and no response (Palm).  The right median (across palm) sensory nerve showed no response (Wrist) and prolonged distal peak latency (Palm, 5.3 ms).  The left radial sensory and the left ulnar sensory nerves showed no response (Wrist).  The right radial sensory nerve showed prolonged distal peak latency (7.7 ms).  The right ulnar sensory nerve showed prolonged distal peak latency (4.6 ms), reduced amplitude (2.6 V), and decreased conduction velocity (  Wrist-5th Digit, 30 m/s).  Left vs. Right side comparison data for the median motor nerve indicates abnormal L-R latency difference (7.5 ms) and abnormal L-R amplitude difference (85.7 %).  The ulnar motor nerve indicates abnormal L-R latency difference (6.1 ms) and abnormal L-R amplitude difference (86.7 %).     Needle evaluation of the right abductor pollicis brevis and the right first dorsal interosseous muscles showed decreased insertional activity, increased spontaneous activity, and diminished recruitment.  All remaining muscles (as indicated in the following table) showed no evidence of electrical instability.    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  Very similar findings to Dr. Narda Amber study in 2018 (listed below in this note).  **There is no evidence of cervical radiculopathy but underlying focal nerve entrapment is impossible to distinguish from the severe polyneuropathy.  Recommendations: 1.  Follow-up with referring physician. 2.  Continue current management of symptoms.  ___________________________ Laurence Spates FAAPMR Board Certified, American Board of Physical Medicine and Rehabilitation    Nerve Conduction Studies Anti Sensory Summary Table   Stim Site NR Peak (ms) Norm Peak (ms) P-T Amp (V) Norm P-T Amp Site1 Site2 Delta-P (ms) Dist (cm) Vel (m/s) Norm Vel (m/s)  Left Median Acr Palm Anti Sensory (2nd Digit)  30C  Wrist *NR  <3.6  >10 Wrist Palm  0.0    Palm *NR  <2.0          Right Median Acr Palm Anti Sensory (2nd Digit)  28.6C  Wrist *NR  <3.6  >10 Wrist Palm  0.0    Palm    *5.3 <2.0 4.3         Left Radial Anti Sensory (Base 1st Digit)  30.5C  Wrist *NR  <3.1   Wrist Base 1st Digit  0.0    Right Radial Anti Sensory (Base 1st Digit)  29.1C  Wrist    *7.7 <3.1 4.2  Wrist Base 1st Digit 7.7 0.0    Left Ulnar Anti Sensory (5th Digit)  30.1C  Wrist *NR  <3.7  >15.0 Wrist 5th Digit  14.0  >38  Right Ulnar Anti Sensory (5th Digit)  29.1C  Wrist    *4.6 <3.7 *2.6 >15.0 Wrist 5th Digit 4.6 14.0 *30 >38   Motor Summary Table   Stim Site NR Onset (ms) Norm Onset (ms) O-P Amp (mV) Norm O-P Amp Site1 Site2 Delta-0 (ms) Dist  (cm) Vel (m/s) Norm Vel (m/s)  Left Median Motor (Abd Poll Brev)  30.6C  Wrist    *13.8 <4.2 *0.7 >5 Elbow Wrist 7.2 21.5 *30 >50  Elbow    21.0  0.6         Right Median Motor (Abd Poll Brev)  29.4C  Wrist    *6.3 <4.2 *0.1 >5 Elbow Wrist 5.9 23.0 *39 >50  Elbow    12.2  0.1         Left Ulnar Motor (Abd Dig Min)  30.4C  Wrist    *4.9 <4.2 *1.5 >3 B Elbow Wrist 12.2 23.0 *19 >53  B Elbow    17.1  0.3  A Elbow B Elbow 5.6 10.0 *18 >53  A Elbow    22.7  1.2         Right Ulnar Motor (Abd Dig Min)  29.5C  Wrist    *11.0 <4.2 *0.2 >3 B Elbow Wrist 11.2 24.0 *21 >53  B Elbow  22.2  1.1  A Elbow B Elbow 2.9 10.0 *34 >53  A Elbow    25.1  0.9          EMG   Side Muscle Nerve Root Ins Act Fibs Psw Amp Dur Poly Recrt Int Fraser Din Comment  Right Abd Poll Brev Median C8-T1 *Decr *3+ *3+ Nml Nml 0 *Reduced Nml   Right 1stDorInt Ulnar C8-T1 *Decr *3+ *3+ Nml Nml 0 *Reduced Nml   Right PronatorTeres Median C6-7 Nml Nml Nml Nml Nml 0 Nml Nml   Right Biceps Musculocut C5-6 Nml Nml Nml Nml Nml 0 Nml Nml   Right Deltoid Axillary C5-6 Nml Nml Nml Nml Nml 0 Nml Nml     Nerve Conduction Studies Anti Sensory Left/Right Comparison   Stim Site L Lat (ms) R Lat (ms) L-R Lat (ms) L Amp (V) R Amp (V) L-R Amp (%) Site1 Site2 L Vel (m/s) R Vel (m/s) L-R Vel (m/s)  Median Acr Palm Anti Sensory (2nd Digit)  30C  Wrist       Wrist Palm     Palm  *5.3   4.3        Radial Anti Sensory (Base 1st Digit)  30.5C  Wrist  *7.7   4.2  Wrist Base 1st Digit     Ulnar Anti Sensory (5th Digit)  30.1C  Wrist  *4.6   *2.6  Wrist 5th Digit  30    Motor Left/Right Comparison   Stim Site L Lat (ms) R Lat (ms) L-R Lat (ms) L Amp (mV) R Amp (mV) L-R Amp (%) Site1 Site2 L Vel (m/s) R Vel (m/s) L-R Vel (m/s)  Median Motor (Abd Poll Brev)  30.6C  Wrist *13.8 *6.3 *7.5 *0.7 *0.1 *85.7 Elbow Wrist *30 *39 9  Elbow 21.0 12.2 8.8 0.6 0.1 83.3       Ulnar Motor (Abd Dig Min)  30.4C  Wrist *4.9 *11.0 *6.1 *1.5 *0.2 *86.7 B  Elbow Wrist *19 *21 2  B Elbow 17.1 22.2 5.1 0.3 1.1 72.7 A Elbow B Elbow *18 *34 16  A Elbow 22.7 25.1 2.4 1.2 0.9 25.0          Waveforms:                      Clinical History: NCV & EMG Findings: 09/20/2016  Extensive electrodiagnostic testing of the right upper extremity and additional studies of the left shows:  1. Bilateral median, ulnar, and right radial sensory response is absent. Left radial sensory response is markedly reduced(4.7 V).   2. Bilateral median motor responses are absent. Bilateral ulnar motor responses show markedly reduced amplitude and latency is prolonged at the left. Proximal motor response of the ulnar nerves was unable to be obtained. 3. Needle electrode examination shows chronic motor axon loss changes affecting the distal hand muscles and conform to a gradient pattern. Proximal arm muscles show normal motor unit configuration and recruitment pattern. There is no evidence of accompanied active denervation.  Impression: The electrophysiologic findings are most consistent with a severe and chronic sensorimotor polyneuropathy neuropathy, axon loss in type, affecting the upper extremities.   ___________________________ Narda Amber, DO   She reports that she has been smoking cigarettes. She has a 8.00 pack-year smoking history. She has never used smokeless tobacco.  Recent Labs    11/08/18 1410 02/28/19 1434 04/15/19 1606 05/30/19 1422  HGBA1C 7.0* 6.6*  --  7.0*  LABURIC  --   --  8.3*  --  Objective:  VS:  HT:    WT:   BMI:     BP:   HR: bpm  TEMP: ( )  RESP:  Physical Exam Constitutional:      Appearance: She is obese.  Musculoskeletal:        General: No swelling, tenderness or deformity.     Comments: Inspection reveals atrophy and wasting of the bilateral APB or FDI and hand intrinsics. There is no swelling, color changes, allodynia or dystrophic changes. There is there is bilateral 4 out of 5 strength with wrist extension  and long finger flexion but significant weakness in bilateral finger abduction.  There is impaired sensation in all fingers distally bilaterally.  There is a negative Hoffmann's test bilaterally.  Skin:    General: Skin is warm and dry.     Findings: No erythema or rash.  Neurological:     General: No focal deficit present.     Mental Status: She is alert and oriented to person, place, and time.     Sensory: Sensory deficit present.     Motor: Weakness present. No abnormal muscle tone.     Coordination: Coordination normal.     Gait: Gait abnormal.  Psychiatric:        Mood and Affect: Mood normal.        Behavior: Behavior normal.     Ortho Exam Imaging: No results found.  Past Medical/Family/Surgical/Social History: Medications & Allergies reviewed per EMR, new medications updated. Patient Active Problem List   Diagnosis Date Noted  . Right carpal tunnel syndrome 06/14/2019  . Left carpal tunnel syndrome 06/14/2019  . Tobacco use 03/01/2019  . Phantom limb pain ([place]) 11/08/2018  . Current moderate episode of major depressive disorder ([place])   . Anemia of chronic disease   . Severe protein-calorie malnutrition ([place])   . Status post below knee amputation, left ([place]) 04/11/2017  . Bipolar affective disorder ([place])   . Adjustment disorder with depressed mood 03/26/2017  . Heart failure with preserved ejection fraction ([place]) 02/09/2017  . Routine adult health maintenance 12/08/2016  . Chronic pain of right knee 09/08/2016  . Chronic kidney disease (CKD), stage III (moderate) 07/15/2016  . OSA (obstructive sleep apnea) 05/13/2016  . Morbid obesity due to excess calories ([place]) 11/27/2015  . Hypothyroidism 04/25/2013  . Controlled type 2 diabetes mellitus ([place]) 07/12/2012  . Carpal tunnel syndrome, bilateral 01/25/2012  . Diabetic peripheral neuropathy ([place]) 07/04/2011  . Anxiety and depression 06/02/2011  . GERD 12/06/2007  . Hyperlipidemia 08/29/2007  . Hypertension  associated with diabetes ([place]) 08/29/2007   Past Medical History:  Diagnosis Date  . Abdominal muscle pain 09/08/2016  . Abnormal Pap smear of cervix 2009  . Abscess    history of multiple abscesses  . Acute bilateral low back pain 02/13/2017  . Acute blood loss anemia   . Acute renal failure ([place]) 07/12/2012  . AKI (acute kidney injury) ([place])   . Anemia of chronic disease 2002  . Anxiety    Panic attacks  . Bilateral lower extremity edema 05/13/2016  . Bipolar disorder ([place])   . Cellulitis 05/21/2014   right eye  . Chronic bronchitis ([place])    "get it q yr" (05/13/2013)  . Chronic pain   . Depression   . Edema of lower extremity   . Endocarditis 2002   subacute bacterial endocarditis.   . Family history of anesthesia complication    "my mom has a hard time coming out from under"  .  Fibromyalgia   . GERD (gastroesophageal reflux disease)    occ  . Heart murmur   . Herpes simplex type 1 infection 01/16/2018  . History of blood transfusion    "just low blood count" (05/13/2013)  . Hyperlipidemia   . Hypertension   . Hypothyroidism   . Hypothyroidism, adult 03/21/2014  . Leukocytosis   . Necrosis ([place])    and ulceration  . Necrotizing fasciitis s/p OR debridements 07/06/2012  . Obesity   . OSA on CPAP    does not wear CPAP  . Peripheral neuropathy   . Type II diabetes mellitus (H[facility])    Type  II   Family History  Problem Relation Age of Onset  . Heart failure Mother   . Diabetes Mother   . Kidney disease Mother   . Kidney disease Father   . Diabetes Father   . Diabetes Paternal Grandmother   . Heart failure Paternal Grandmother   . Other Other    Past Surgical History:  Procedure Laterality Date  . ABDOMINAL AORTOGRAM W/LOWER EXTREMITY Left 03/19/2019   Procedure: ABDOMINAL AORTOGRAM W/LOWER EXTREMITY;  Surgeon: Serafina Mitchell, MD;  Location: [facility];  Service: Cardiovascular;  Laterality: Left;  . AMPUTATION Left 04/07/2017   Procedure: LEFT BELOW  KNEE AMPUTATION;  Surgeon: Newt Minion, MD;  Location: [facility];  Service: Orthopedics;  Laterality: Left;  . EYE SURGERY     lazer  . INCISION AND DRAINAGE ABSCESS     multiple I&Ds  . INCISION AND DRAINAGE ABSCESS Left 07/09/2012   Procedure: DRESSING CHANGE, THIGH WOUND;  Surgeon: Harl Bowie, MD;  Location: [facility];  Service: General;  Laterality: Left;  . INCISION AND DRAINAGE OF WOUND Left 07/07/2012   Procedure: IRRIGATION AND DEBRIDEMENT WOUND;  Surgeon: Harl Bowie, MD;  Location: [facility];  Service: General;  Laterality: Left;  . INCISION AND DRAINAGE PERIRECTAL ABSCESS Left 07/14/2012   Procedure: DEBRIDEMENT OF SKIN & SOFT TISSUE; DRESSING CHANGE UNDER ANESTHESIA;  Surgeon: Gayland Curry, MD,FACS;  Location: [facility];  Service: General;  Laterality: Left;  . INCISION AND DRAINAGE PERIRECTAL ABSCESS Left 07/16/2012   Procedure: I&D Left Thigh;  Surgeon: Gwenyth Ober, MD;  Location: [facility];  Service: General;  Laterality: Left;  . INCISION AND DRAINAGE PERIRECTAL ABSCESS N/A 01/05/2015   Procedure: IRRIGATION AND DEBRIDEMENT PERIRECTAL ABSCESS;  Surgeon: Donnie Mesa, MD;  Location: [facility];  Service: General;  Laterality: N/A;  . IRRIGATION AND DEBRIDEMENT ABSCESS Left 07/06/2012   Procedure: IRRIGATION AND DEBRIDEMENT ABSCESS BUTTOCKS AND THIGH;  Surgeon: Shann Medal, MD;  Location: [facility] [place];  Service: General;  Laterality: Left;  . IRRIGATION AND DEBRIDEMENT ABSCESS Left 08/10/2012   Procedure: IRRIGATION AND DEBRIDEMENT ABSCESS;  Surgeon: Madilyn Hook, DO;  Location: [place];  Service: General;  Laterality: Left;  . STUMP REVISION Left 06/09/2017   Procedure: REVISION LEFT BELOW KNEE AMPUTATION;  Surgeon: Newt Minion, MD;  Location: [place] [facility];  Service: Orthopedics;  Laterality: Left;  . TONSILLECTOMY  1994   Social History   Occupational History  . Occupation: disability  Tobacco Use  . Smoking status: Current Every Day Smoker    Packs/day: 0.50    Years: 16.00    Pack years:  8.00    Types: Cigarettes  . Smokeless tobacco: Never Used  . Tobacco comment: 1 PPD  Substance and Sexual Activity  . Alcohol use: Yes    Alcohol/week: 0.0 standard drinks    Comment:  Socially-" monthy  maybe"  . Drug use: No  . Sexual activity: Yes

## 2019-07-11 ENCOUNTER — Ambulatory Visit: Payer: Medicare Other | Admitting: Orthopedic Surgery

## 2019-07-16 ENCOUNTER — Telehealth: Payer: Self-pay | Admitting: Internal Medicine

## 2019-07-16 ENCOUNTER — Ambulatory Visit: Payer: Medicare HMO | Admitting: Orthopaedic Surgery

## 2019-07-16 NOTE — Telephone Encounter (Signed)
Pt requesting a call back in reference to a letter being written for (ESA) to Verdis Prime with [employer] [phone] for her Cat that she has raised since birth.  Pt states she is on Section  and  her cat provides Emotional support due to her aniexty and depression and needs to keep her cat with her during her New move.  Pt states without this letter she can not keep her cat with her.  Please call back.

## 2019-07-18 DIAGNOSIS — I129 Hypertensive chronic kidney disease with stage 1 through stage 4 chronic kidney disease, or unspecified chronic kidney disease: Secondary | ICD-10-CM | POA: Diagnosis not present

## 2019-07-22 ENCOUNTER — Ambulatory Visit: Payer: Medicare HMO | Admitting: Orthopedic Surgery

## 2019-07-22 ENCOUNTER — Other Ambulatory Visit: Payer: Self-pay | Admitting: Internal Medicine

## 2019-07-22 DIAGNOSIS — B3731 Acute candidiasis of vulva and vagina: Secondary | ICD-10-CM

## 2019-07-22 DIAGNOSIS — I152 Hypertension secondary to endocrine disorders: Secondary | ICD-10-CM

## 2019-07-22 DIAGNOSIS — B373 Candidiasis of vulva and vagina: Secondary | ICD-10-CM

## 2019-07-22 DIAGNOSIS — I5032 Chronic diastolic (congestive) heart failure: Secondary | ICD-10-CM

## 2019-07-22 DIAGNOSIS — E1159 Type 2 diabetes mellitus with other circulatory complications: Secondary | ICD-10-CM

## 2019-07-22 DIAGNOSIS — E1121 Type 2 diabetes mellitus with diabetic nephropathy: Secondary | ICD-10-CM

## 2019-07-23 ENCOUNTER — Telehealth: Payer: Self-pay | Admitting: *Deleted

## 2019-07-23 NOTE — Telephone Encounter (Signed)
Received call from [facility], to clarify whether pt is on both Xultophy and Victoza.  Per 1/7 ov note, "states that she discontinue this medication (Xultophy) as she was getting a reaction at the site of injection. " Informed Sharyn Lull pt is only taking Victoza. If this is incorrect, let me know. Thanks

## 2019-07-30 ENCOUNTER — Ambulatory Visit: Payer: Medicare HMO | Admitting: Orthopaedic Surgery

## 2019-07-31 ENCOUNTER — Telehealth: Payer: Self-pay

## 2019-07-31 NOTE — Telephone Encounter (Signed)
[name] [name] from [facility] faxed forms for Dr. Erlinda Hong to fill out. Dr Erlinda Hong filled out and we faxed them today. Sent originals to scan to Epic.    Fax: [phone]   [place]: [phone]

## 2019-08-12 ENCOUNTER — Other Ambulatory Visit: Payer: Self-pay | Admitting: Internal Medicine

## 2019-08-12 NOTE — Telephone Encounter (Signed)
My understanding after chart review is #45 pills oxycodone per 30 days and got on 3/1

## 2019-08-19 ENCOUNTER — Other Ambulatory Visit: Payer: Self-pay | Admitting: Internal Medicine

## 2019-08-19 DIAGNOSIS — I5032 Chronic diastolic (congestive) heart failure: Secondary | ICD-10-CM

## 2019-08-21 ENCOUNTER — Other Ambulatory Visit: Payer: Self-pay | Admitting: Internal Medicine

## 2019-08-22 ENCOUNTER — Other Ambulatory Visit: Payer: Self-pay | Admitting: Internal Medicine

## 2019-08-27 ENCOUNTER — Telehealth: Payer: Self-pay | Admitting: Orthopedic Surgery

## 2019-08-27 NOTE — Telephone Encounter (Signed)
Patient called and requested a Rx for new liners and socks.  Please call patient to advise.  [phone]

## 2019-08-28 NOTE — Telephone Encounter (Signed)
Patient called this morning to check on the RX for [facility].  Patient has an appointment with them tomorrow.  CB# [phone].

## 2019-08-28 NOTE — Telephone Encounter (Signed)
ok 

## 2019-08-28 NOTE — Telephone Encounter (Signed)
Can you please write rx for pt?

## 2019-08-29 ENCOUNTER — Encounter: Payer: Medicare Other | Admitting: Internal Medicine

## 2019-09-01 ENCOUNTER — Emergency Department (HOSPITAL_COMMUNITY): Payer: Medicare HMO

## 2019-09-01 ENCOUNTER — Other Ambulatory Visit: Payer: Self-pay

## 2019-09-01 ENCOUNTER — Emergency Department (HOSPITAL_COMMUNITY)
Admission: EM | Admit: 2019-09-01 | Discharge: 2019-09-02 | Disposition: A | Discharge status: Home / Self Care | Payer: Medicare HMO | Source: Home / Self Care | Attending: Emergency Medicine | Admitting: Emergency Medicine

## 2019-09-01 DIAGNOSIS — T8130XA Disruption of wound, unspecified, initial encounter: Secondary | ICD-10-CM | POA: Diagnosis not present

## 2019-09-01 DIAGNOSIS — Y835 Amputation of limb(s) as the cause of abnormal reaction of the patient, or of later complication, without mention of misadventure at the time of the procedure: Secondary | ICD-10-CM | POA: Diagnosis present

## 2019-09-01 DIAGNOSIS — E039 Hypothyroidism, unspecified: Secondary | ICD-10-CM | POA: Diagnosis not present

## 2019-09-01 DIAGNOSIS — E1142 Type 2 diabetes mellitus with diabetic polyneuropathy: Secondary | ICD-10-CM | POA: Diagnosis not present

## 2019-09-01 DIAGNOSIS — E785 Hyperlipidemia, unspecified: Secondary | ICD-10-CM | POA: Diagnosis present

## 2019-09-01 DIAGNOSIS — I5030 Unspecified diastolic (congestive) heart failure: Secondary | ICD-10-CM | POA: Diagnosis not present

## 2019-09-01 DIAGNOSIS — Z79899 Other long term (current) drug therapy: Secondary | ICD-10-CM | POA: Diagnosis not present

## 2019-09-01 DIAGNOSIS — S21109A Unspecified open wound of unspecified front wall of thorax without penetration into thoracic cavity, initial encounter: Secondary | ICD-10-CM | POA: Diagnosis not present

## 2019-09-01 DIAGNOSIS — Z841 Family history of disorders of kidney and ureter: Secondary | ICD-10-CM | POA: Diagnosis not present

## 2019-09-01 DIAGNOSIS — Z6841 Body Mass Index (BMI) 40.0 and over, adult: Secondary | ICD-10-CM | POA: Diagnosis not present

## 2019-09-01 DIAGNOSIS — F419 Anxiety disorder, unspecified: Secondary | ICD-10-CM | POA: Diagnosis present

## 2019-09-01 DIAGNOSIS — E875 Hyperkalemia: Secondary | ICD-10-CM | POA: Diagnosis not present

## 2019-09-01 DIAGNOSIS — M869 Osteomyelitis, unspecified: Secondary | ICD-10-CM | POA: Diagnosis present

## 2019-09-01 DIAGNOSIS — W19XXXA Unspecified fall, initial encounter: Secondary | ICD-10-CM | POA: Diagnosis not present

## 2019-09-01 DIAGNOSIS — Z833 Family history of diabetes mellitus: Secondary | ICD-10-CM | POA: Diagnosis not present

## 2019-09-01 DIAGNOSIS — T879 Unspecified complications of amputation stump: Secondary | ICD-10-CM

## 2019-09-01 DIAGNOSIS — I10 Essential (primary) hypertension: Secondary | ICD-10-CM | POA: Diagnosis present

## 2019-09-01 DIAGNOSIS — F329 Major depressive disorder, single episode, unspecified: Secondary | ICD-10-CM | POA: Diagnosis present

## 2019-09-01 DIAGNOSIS — K219 Gastro-esophageal reflux disease without esophagitis: Secondary | ICD-10-CM | POA: Diagnosis present

## 2019-09-01 DIAGNOSIS — Z89512 Acquired absence of left leg below knee: Secondary | ICD-10-CM | POA: Diagnosis not present

## 2019-09-01 DIAGNOSIS — L02416 Cutaneous abscess of left lower limb: Secondary | ICD-10-CM | POA: Diagnosis present

## 2019-09-01 DIAGNOSIS — E1165 Type 2 diabetes mellitus with hyperglycemia: Secondary | ICD-10-CM | POA: Diagnosis not present

## 2019-09-01 DIAGNOSIS — T8744 Infection of amputation stump, left lower extremity: Secondary | ICD-10-CM | POA: Diagnosis present

## 2019-09-01 DIAGNOSIS — D638 Anemia in other chronic diseases classified elsewhere: Secondary | ICD-10-CM | POA: Diagnosis not present

## 2019-09-01 DIAGNOSIS — B372 Candidiasis of skin and nail: Secondary | ICD-10-CM

## 2019-09-01 DIAGNOSIS — Z8249 Family history of ischemic heart disease and other diseases of the circulatory system: Secondary | ICD-10-CM | POA: Diagnosis not present

## 2019-09-01 DIAGNOSIS — E1169 Type 2 diabetes mellitus with other specified complication: Secondary | ICD-10-CM | POA: Diagnosis present

## 2019-09-01 DIAGNOSIS — Z79891 Long term (current) use of opiate analgesic: Secondary | ICD-10-CM | POA: Diagnosis not present

## 2019-09-01 DIAGNOSIS — T8789 Other complications of amputation stump: Secondary | ICD-10-CM | POA: Diagnosis not present

## 2019-09-01 DIAGNOSIS — T8781 Dehiscence of amputation stump: Secondary | ICD-10-CM | POA: Diagnosis not present

## 2019-09-01 DIAGNOSIS — M797 Fibromyalgia: Secondary | ICD-10-CM | POA: Diagnosis not present

## 2019-09-01 DIAGNOSIS — Z20822 Contact with and (suspected) exposure to covid-19: Secondary | ICD-10-CM | POA: Diagnosis present

## 2019-09-01 DIAGNOSIS — F17210 Nicotine dependence, cigarettes, uncomplicated: Secondary | ICD-10-CM | POA: Diagnosis present

## 2019-09-01 LAB — I-STAT BETA HCG BLOOD, ED (MC, WL, AP ONLY): I-stat hCG, quantitative: <5 m[IU]/mL (ref <5)

## 2019-09-01 LAB — COMPREHENSIVE METABOLIC PANEL
ALT: 13 U/L (ref 0–44)
AST: 11 U/L — ABNORMAL LOW (ref 15–41)
Albumin: 1.8 g/dL — ABNORMAL LOW (ref 3.5–5.0)
Alkaline Phosphatase: 88 U/L (ref 38–126)
Anion gap: 9 (ref 5–15)
BUN: 34 mg/dL — ABNORMAL HIGH (ref 6–20)
CO2: 20 mmol/L — ABNORMAL LOW (ref 22–32)
Calcium: 8.1 mg/dL — ABNORMAL LOW (ref 8.9–10.3)
Chloride: 106 mmol/L (ref 98–111)
Creatinine, Ser: 2.36 mg/dL — ABNORMAL HIGH (ref 0.44–1.00)
GFR calc Af Amer: 28 mL/min — ABNORMAL LOW (ref >60)
GFR calc non Af Amer: 24 mL/min — ABNORMAL LOW (ref >60)
Glucose, Bld: 164 mg/dL — ABNORMAL HIGH (ref 70–99)
Potassium: 4.6 mmol/L (ref 3.5–5.1)
Sodium: 135 mmol/L (ref 135–145)
Total Bilirubin: 0.4 mg/dL (ref 0.3–1.2)
Total Protein: 6.4 g/dL — ABNORMAL LOW (ref 6.5–8.1)

## 2019-09-01 LAB — CBC WITH DIFFERENTIAL/PLATELET
Abs Immature Granulocytes: 0.04 10*3/uL (ref 0.00–0.07)
Basophils Absolute: 0 10*3/uL (ref 0.0–0.1)
Basophils Relative: 0 %
Eosinophils Absolute: 0.1 10*3/uL (ref 0.0–0.5)
Eosinophils Relative: 1 %
HCT: 27.7 % — ABNORMAL LOW (ref 36.0–46.0)
Hemoglobin: 8.4 g/dL — ABNORMAL LOW (ref 12.0–15.0)
Immature Granulocytes: 1 %
Lymphocytes Relative: 15 %
Lymphs Abs: 1 10*3/uL (ref 0.7–4.0)
MCH: 26.3 pg (ref 26.0–34.0)
MCHC: 30.3 g/dL (ref 30.0–36.0)
MCV: 86.6 fL (ref 80.0–100.0)
Monocytes Absolute: 0.9 10*3/uL (ref 0.1–1.0)
Monocytes Relative: 13 %
Neutro Abs: 4.9 10*3/uL (ref 1.7–7.7)
Neutrophils Relative %: 70 %
Platelets: 270 10*3/uL (ref 150–400)
RBC: 3.2 MIL/uL — ABNORMAL LOW (ref 3.87–5.11)
RDW: 14.4 % (ref 11.5–15.5)
WBC: 6.9 10*3/uL (ref 4.0–10.5)
nRBC: 0 % (ref 0.0–0.2)

## 2019-09-01 LAB — LACTIC ACID, PLASMA: Lactic Acid, Venous: 0.7 mmol/L (ref 0.5–1.9)

## 2019-09-01 MED ORDER — SODIUM CHLORIDE 0.9% FLUSH: 3.0000 mL | Freq: Once | INTRAVENOUS | Status: DC

## 2019-09-01 NOTE — ED Triage Notes (Addendum)
Onset 2 days ago increased pain to left stump, chills, left lateral abd pain, sore throat.   Pain interfering with pt wearing prosthesis.  Pt states she was treated with antibiotics 2 weeks ago for infection in left stump.  Pt thinks there may still be some drainage.    Pt also wanting to get HIV test

## 2019-09-02 ENCOUNTER — Telehealth: Payer: Self-pay | Admitting: Internal Medicine

## 2019-09-02 MED ORDER — TERBINAFINE HCL 250 MG PO TABS: 250.0000 mg | ORAL_TABLET | Freq: Every day | ORAL | 0 refills | Status: DC

## 2019-09-02 MED ORDER — OXYCODONE-ACETAMINOPHEN 5-325 MG PO TABS
2.0000 | ORAL_TABLET | Freq: Once | ORAL | Status: AC
  Administered 2019-09-02: 03:00:00 2 via ORAL
  Filled 2019-09-02: qty 2

## 2019-09-02 NOTE — Telephone Encounter (Signed)
RTC, pt is requesting the letter Dr. Tarri Abernethy wrote for her to keep an emotional support animal.  Letter printed and left up front for patient to pick up at 09/04/19 appointment.  SChaplin, RN,BSN

## 2019-09-02 NOTE — Telephone Encounter (Signed)
Pt requesting a nurse to call back about her ESA letter for her cat to be sent to her Secondary school teacher.

## 2019-09-02 NOTE — Discharge Instructions (Signed)
Your symptoms are consistent with a yeast infection of your skin.  This can result from skin remaining moist and warm for long periods of time.  You are predisposed to yeast infections given your history of diabetes.  You may have an increased likelihood of yeast infections when your sugars are poorly controlled.  Today, your evaluation in the ED has been reassuring.  We recommend that you try and limit the use of your prosthetic and allow your stump to be exposed to the air.  Restart Lamisil until you are able to see Dr. Sharol Given.  Keep this orthopedic follow-up for wound recheck.  You may also follow-up with your primary care doctor for further evaluation, if desired.

## 2019-09-02 NOTE — ED Provider Notes (Signed)
[facility] EMERGENCY DEPARTMENT Provider Note   CSN: [id number] Arrival date & time: 09/01/19  1923     History Chief Complaint  Patient presents with  . Wound Infection    [name] [name] is a 44 y.o. female.   44 year old female with a history of dyslipidemia, hypertension, subacute endocarditis, diabetes, esophageal reflux, fibromyalgia on chronic Percocet presents to the emergency department today complaining of irritation and drainage of her left BKA stump.  While she reports increased pain over the past 2 days, this is a cyclical issue for the patient and not necessarily acute.  She last saw Dr. Sharol Given, her orthopedist, 2 months ago for similar complaints.  Reports that her prosthetic is ill fitting and causes her discomfort that has not been relieved by her chronic narcotics.  Describes a sharp pain to the left lateral aspect of her stump.  Has also had increased clear, watery drainage and skin sloughing.  She is concerned about infection.  Triage references course of antibiotics 2 weeks ago; however, patient reports that she has not taken any oral medications for the past 2 months.  Was prescribed 2-week course of Lamisil by Dr. Sharol Given in February.  She does continue to use topical nystatin and mupirocin on her left BKA stump for management, but does not feel that it helps her.  Reports some chills prior to arrival, but no known fevers.  No recent trauma or injury.  Denies purulent drainage from the area.  Has follow up with Dr. Sharol Given in 2 or 3 days.  The history is provided by the patient. No language interpreter was used.       Past Medical History:  Diagnosis Date  . Abdominal muscle pain 09/08/2016  . Abnormal Pap smear of cervix 2009  . Abscess    history of multiple abscesses  . Acute bilateral low back pain 02/13/2017  . Acute blood loss anemia   . Acute renal failure ([place]) 07/12/2012  . AKI (acute kidney injury) ([place])   . Anemia of chronic disease  2002  . Anxiety    Panic attacks  . Bilateral lower extremity edema 05/13/2016  . Bipolar disorder ([place])   . Cellulitis 05/21/2014   right eye  . Chronic bronchitis ([place])    "get it q yr" (05/13/2013)  . Chronic pain   . Depression   . Edema of lower extremity   . Endocarditis 2002   subacute bacterial endocarditis.   . Family history of anesthesia complication    "my mom has a hard time coming out from under"  . Fibromyalgia   . GERD (gastroesophageal reflux disease)    occ  . Heart murmur   . Herpes simplex type 1 infection 01/16/2018  . History of blood transfusion    "just low blood count" (05/13/2013)  . Hyperlipidemia   . Hypertension   . Hypothyroidism   . Hypothyroidism, adult 03/21/2014  . Leukocytosis   . Necrosis ([place])    and ulceration  . Necrotizing fasciitis s/p OR debridements 07/06/2012  . Obesity   . OSA on CPAP    does not wear CPAP  . Peripheral neuropathy   . Type II diabetes mellitus ([place])    Type  II    Patient Active Problem List   Diagnosis Date Noted  . Right carpal tunnel syndrome 06/14/2019  . Left carpal tunnel syndrome 06/14/2019  . Tobacco use 03/01/2019  . Phantom limb pain ([place]) 11/08/2018  . Current moderate episode of major depressive  disorder ([place])   . Anemia of chronic disease   . Severe protein-calorie malnutrition ([place])   . Status post below knee amputation, left ([place]) 04/11/2017  . Bipolar affective disorder ([place])   . Adjustment disorder with depressed mood 03/26/2017  . Heart failure with preserved ejection fraction ([place]) 02/09/2017  . Routine adult health maintenance 12/08/2016  . Chronic pain of right knee 09/08/2016  . Chronic kidney disease (CKD), stage III (moderate) 07/15/2016  . OSA (obstructive sleep apnea) 05/13/2016  . Morbid obesity due to excess calories ([place]) 11/27/2015  . Hypothyroidism 04/25/2013  . Controlled type 2 diabetes mellitus ([place]) 07/12/2012  . Carpal tunnel syndrome, bilateral 01/25/2012  .  Diabetic peripheral neuropathy ([place]) 07/04/2011  . Anxiety and depression 06/02/2011  . GERD 12/06/2007  . Hyperlipidemia 08/29/2007  . Hypertension associated with diabetes ([place]) 08/29/2007    Past Surgical History:  Procedure Laterality Date  . ABDOMINAL AORTOGRAM W/LOWER EXTREMITY Left 03/19/2019   Procedure: ABDOMINAL AORTOGRAM W/LOWER EXTREMITY;  Surgeon: Serafina Mitchell, MD;  Location: [place] [facility];  Service: Cardiovascular;  Laterality: Left;  . AMPUTATION Left 04/07/2017   Procedure: LEFT BELOW KNEE AMPUTATION;  Surgeon: Newt Minion, MD;  Location: [place];  Service: Orthopedics;  Laterality: Left;  . EYE SURGERY     [place]  . INCISION AND DRAINAGE ABSCESS     multiple I&Ds  . INCISION AND DRAINAGE ABSCESS Left 07/09/2012   Procedure: DRESSING CHANGE, THIGH WOUND;  Surgeon: Harl Bowie, MD;  Location: [place];  Service: General;  Laterality: Left;  . INCISION AND DRAINAGE OF WOUND Left 07/07/2012   Procedure: IRRIGATION AND DEBRIDEMENT WOUND;  Surgeon: Harl Bowie, MD;  Location: [place];  Service: General;  Laterality: Left;  . INCISION AND DRAINAGE PERIRECTAL ABSCESS Left 07/14/2012   Procedure: DEBRIDEMENT OF SKIN & SOFT TISSUE; DRESSING CHANGE UNDER ANESTHESIA;  Surgeon: Gayland Curry, MD,FACS;  Location: [place];  Service: General;  Laterality: Left;  . INCISION AND DRAINAGE PERIRECTAL ABSCESS Left 07/16/2012   Procedure: I&D Left Thigh;  Surgeon: Gwenyth Ober, MD;  Location: [place];  Service: General;  Laterality: Left;  . INCISION AND DRAINAGE PERIRECTAL ABSCESS N/A 01/05/2015   Procedure: IRRIGATION AND DEBRIDEMENT PERIRECTAL ABSCESS;  Surgeon: Donnie Mesa, MD;  Location: [place];  Service: General;  Laterality: N/A;  . IRRIGATION AND DEBRIDEMENT ABSCESS Left 07/06/2012   Procedure: IRRIGATION AND DEBRIDEMENT ABSCESS BUTTOCKS AND THIGH;  Surgeon: Shann Medal, MD;  Location: [place];  Service: General;  Laterality: Left;  . IRRIGATION AND DEBRIDEMENT ABSCESS Left  08/10/2012   Procedure: IRRIGATION AND DEBRIDEMENT ABSCESS;  Surgeon: Madilyn Hook, DO;  Location: [place];  Service: General;  Laterality: Left;  . STUMP REVISION Left 06/09/2017   Procedure: REVISION LEFT BELOW KNEE AMPUTATION;  Surgeon: Newt Minion, MD;  Location: [place];  Service: Orthopedics;  Laterality: Left;  . TONSILLECTOMY  1994     OB History   No obstetric history on file.     Family History  Problem Relation Age of Onset  . Heart failure Mother   . Diabetes Mother   . Kidney disease Mother   . Kidney disease Father   . Diabetes Father   . Diabetes Paternal Grandmother   . Heart failure Paternal Grandmother   . Other Other     Social History   Tobacco Use  . Smoking status: Current Every Day Smoker    Packs/day: 0.50    Years: 16.00  Pack years: 8.00    Types: Cigarettes  . Smokeless tobacco: Never Used  . Tobacco comment: 1 PPD  Substance Use Topics  . Alcohol use: Yes    Alcohol/week: 0.0 standard drinks    Comment: Socially-" monthy  maybe"  . Drug use: No    Home Medications Prior to Admission medications   Medication Sig Start Date End Date Taking? Authorizing Provider  bumetanide (BUMEX) 1 MG tablet TAKE 1 TABLET BY MOUTH 2 TIMES DAILY 08/19/19   Ina Homes, MD  oxyCODONE-acetaminophen (PERCOCET/ROXICET) 5-325 MG tablet Take 1-2 tablets by mouth daily as needed for severe pain. 08/22/19   Ina Homes, MD  allopurinol (ZYLOPRIM) 100 MG tablet Take 1 tablet (100 mg total) by mouth 2 (two) times daily. 04/16/19   Newt Minion, MD  aluminum chloride (DRYSOL) 20 % external solution Apply topically at bedtime. 10/23/18   Suzan Slick, NP  amitriptyline (ELAVIL) 50 MG tablet Take 50 mg by mouth at bedtime. 09/03/18   [provider]  amLODipine (NORVASC) 10 MG tablet TAKE 1 TABLET BY MOUTH EVERY DAY 07/22/19   Ina Homes, MD  atorvastatin (LIPITOR) 20 MG tablet Take 1 tablet (20 mg total) by mouth daily. 06/29/18   Ina Homes, MD   blood glucose meter kit and supplies KIT ICD 10- E11.69. Based on patient and insurance preference. Use up to four times daily as directed. 05/01/18   Love, Ivan Anchors, PA-C  Colchicine 0.6 MG CAPS Take 0.6 mg by mouth daily. 04/16/19   Newt Minion, MD  diclofenac Sodium (VOLTAREN) 1 % GEL Apply 1 application topically 4 (four) times daily as needed (pain). 06/26/19   Ina Homes, MD  FLUoxetine (PROZAC) 20 MG/5ML solution Take 15 mLs (60 mg total) by mouth daily. Patient taking differently: Take 60 mg by mouth 2 (two) times daily.  06/29/18 06/24/19  Ina Homes, MD  hydrOXYzine (ATARAX/VISTARIL) 25 MG tablet TAKE 1 TABLET BY MOUTH EVERY SIX HOURS AS NEEDED FOR ANXIETY 08/12/19   Bartholomew Crews, MD  iron polysaccharides (NIFEREX) 150 MG capsule Take 1 capsule (150 mg total) by mouth 2 (two) times daily before lunch and supper. 06/29/18   Helberg, Larkin Ina, MD  lisinopril (ZESTRIL) 10 MG tablet TAKE 1 TABLET BY MOUTH EVERY DAY 04/23/19   Ina Homes, MD  metoprolol tartrate (LOPRESSOR) 25 MG tablet Take 0.5 tablets (12.5 mg total) by mouth 2 (two) times daily. 06/29/18   Ina Homes, MD  mupirocin ointment (BACTROBAN) 2 % Apply 1 application to affected area 2 times daily 12/22/18   Ina Homes, MD  nystatin (MYCOSTATIN/NYSTOP) powder Apply topically 4 times daily. After cleaning and drying the affected area for two weeks 07/22/19   Ina Homes, MD  pregabalin (LYRICA) 50 MG capsule Take 1 capsule (50 mg total) by mouth 2 (two) times daily. Patient taking differently: Take 50 mg by mouth 3 (three) times daily.  11/08/18   Ina Homes, MD  terbinafine (LAMISIL) 250 MG tablet Take 1 tablet (250 mg total) by mouth daily. 09/02/19   Antonietta Breach, PA-C  tetrahydrozoline 0.05 % ophthalmic solution Place 2 drops into both eyes 2 (two) times daily as needed (red/ dry eyes). [place]    [provider]  TRUEPLUS PEN NEEDLES 31G X 5 MM MISC Use pen needle with insulin 3 times daily 08/12/19    Bartholomew Crews, MD  varenicline (CHANTIX PAK) 0.5 MG X 11 & 1 MG X 42 tablet Take one 0.5 mg tablet by mouth  once daily for 3 days, then increase to one 0.5 mg tablet twice daily for 4 days, then increase to one 1 mg tablet twice daily. Patient taking differently: Take 0.5-1 mg by mouth See admin instructions. Take one 0.5 mg tablet by mouth once daily for 3 days, then increase to one 0.5 mg tablet twice daily for 4 days, then increase to one 1 mg tablet twice daily. 02/28/19   Helberg, Larkin Ina, MD  VICTOZA 18 MG/3ML SOPN Inject 0.3 mLs (1.8 mg total) below the skin daily. 07/22/19   Ina Homes, MD    Allergies    Vancomycin  Review of Systems   Review of Systems  Ten systems reviewed and are negative for acute change, except as noted in the HPI.    Physical Exam Updated Vital Signs BP (!) 164/87 (BP Location: Right Arm)   Pulse (!) 101   Temp 99.2 F (37.3 C) (Oral)   Resp 18   Ht 5' 9" (1.753 m)   Wt 120.7 kg   LMP 08/04/2019   SpO2 100%   BMI 39.28 kg/m   Physical Exam Vitals and nursing note reviewed.  Constitutional:      General: She is not in acute distress.    Appearance: She is well-developed. She is not diaphoretic.     Comments: Nontoxic appearing and in NAD  HENT:     Head: Normocephalic and atraumatic.     Mouth/Throat:     Comments: Oropharynx clear. Tolerating secretions. Normal phonation. Eyes:     General: No scleral icterus.    Conjunctiva/sclera: Conjunctivae normal.  Pulmonary:     Effort: Pulmonary effort is normal. No respiratory distress.     Comments: Respirations even and unlabored Musculoskeletal:        General: Normal range of motion.     Cervical back: Normal range of motion.     Comments: Left BKA stump without erythema, heat to touch, induration, lymphangitic streaking. There is no purulence; however copious serious drainage fell to the floor upon removal of the padding over her stump. No active persistent drainage. Skin of stump  with areas of maceration, desquamation, and sloughing.    Skin:    General: Skin is warm and dry.     Coloration: Skin is not pale.     Findings: No erythema or rash.  Neurological:     Mental Status: She is alert and oriented to person, place, and time.  Psychiatric:        Behavior: Behavior normal.     ED Results / Procedures / Treatments   Labs (all labs ordered are listed, but only abnormal results are displayed) Labs Reviewed  COMPREHENSIVE METABOLIC PANEL - Abnormal; Notable for the following components:      Result Value   CO2 20 (*)    Glucose, Bld 164 (*)    BUN 34 (*)    Creatinine, Ser 2.36 (*)    Calcium 8.1 (*)    Total Protein 6.4 (*)    Albumin 1.8 (*)    AST 11 (*)    GFR calc non Af Amer 24 (*)    GFR calc Af Amer 28 (*)    All other components within normal limits  CBC WITH DIFFERENTIAL/PLATELET - Abnormal; Notable for the following components:   RBC 3.20 (*)    Hemoglobin 8.4 (*)    HCT 27.7 (*)    All other components within normal limits  LACTIC ACID, PLASMA  URINALYSIS, ROUTINE W REFLEX MICROSCOPIC  I-STAT BETA HCG BLOOD, ED ([facility], [facility], [facility] ONLY)    EKG None  Radiology DG Chest 2 View  Result Date: 09/01/2019 CLINICAL DATA:  Wound infection EXAM: CHEST - 2 VIEW COMPARISON:  02/13/2019 FINDINGS: Lungs are clear.  No pleural effusion or pneumothorax. The heart is normal in size. Degenerative changes of the visualized thoracolumbar spine. IMPRESSION: Normal chest radiographs. Electronically Signed   By: Julian Hy M.D.   On: 09/01/2019 22:09    Procedures Procedures (including critical care time)  Medications Ordered in ED Medications  oxyCODONE-acetaminophen (PERCOCET/ROXICET) 5-325 MG per tablet 2 tablet (2 tablets Oral Given 09/02/19 0245)    ED Course  I have reviewed the triage vital signs and the nursing notes.  Pertinent labs & imaging results that were available during my care of the patient were reviewed by me and considered in  my medical decision making (see chart for details).    MDM Rules/Calculators/A&P                      44 year old female presents to the emergency department for evaluation of her left BKA stump.  Upon chart review, it appears that the patient is having ongoing issues with her BKA stump.  This is already known to her orthopedist, Dr. Sharol Given.  She has scheduled follow-up for recheck in 2 or 3 days.  Skin to the left BKA stump is macerated, moist.  There are some areas of desquamation and skin sloughing.  Do not appreciate there to be an acute cellulitis or underlying soft tissue infection.  The patient is afebrile in the ED.  No leukocytosis or elevated lactate.  Her labs have been reviewed and appear at baseline.  Physical exam is suggestive of candidal dermatitis.  It seems that the patient was last on oral agents for this in February.  Will start back on Lamisil until able to see her specialist.  Advised that patient keep the stump clean and dry and avoid use of her prosthesis, especially since she finds it malfitting.  Return precautions discussed and provided. Patient discharged in stable condition with no unaddressed concerns.   Final Clinical Impression(s) / ED Diagnoses Final diagnoses:  Candidal dermatitis  BKA stump complication ([place])    Rx / DC Orders ED Discharge Orders         Ordered    terbinafine (LAMISIL) 250 MG tablet  Daily     09/02/19 0324           Antonietta Breach, PA-C 09/02/19 0459    Ripley Fraise, MD 09/03/19 [phone]

## 2019-09-03 ENCOUNTER — Other Ambulatory Visit: Payer: Self-pay

## 2019-09-03 ENCOUNTER — Encounter (HOSPITAL_COMMUNITY): Payer: Self-pay | Admitting: Orthopedic Surgery

## 2019-09-03 ENCOUNTER — Encounter: Payer: Self-pay | Admitting: Orthopedic Surgery

## 2019-09-03 ENCOUNTER — Ambulatory Visit (INDEPENDENT_AMBULATORY_CARE_PROVIDER_SITE_OTHER): Payer: Medicare HMO | Admitting: Orthopedic Surgery

## 2019-09-03 ENCOUNTER — Other Ambulatory Visit: Payer: Self-pay | Admitting: Physician Assistant

## 2019-09-03 VITALS — Ht 69.0 in | Wt 266.0 lb

## 2019-09-03 DIAGNOSIS — T8781 Dehiscence of amputation stump: Secondary | ICD-10-CM | POA: Diagnosis not present

## 2019-09-03 NOTE — Progress Notes (Signed)
Office Visit Note   Patient: [name] [name]           Date of Birth: 03-17-1976           MRN: [id number] Visit Date: 09/03/2019              Requested by: Ina Homes, MD [address] [place],  [place] [address] PCP: Ina Homes, MD  Chief Complaint  Patient presents with  . Left Leg - Pain    S/p BKA follow up ER visit 09/01/19      HPI: Patient is a 44 year old woman who is status post a left transtibial amputation.  Patient states she went to the emergency room 2 days ago with a large draining ulcer she states she was advised that this was a fungal infection that she had athlete's foot of the residual limb and she was given a prescription for Lamisil tablets.  Patient states that she has been having fever and chills nausea and vomiting and cellulitis in the residual limb.  Assessment & Plan: Visit Diagnoses:  1. Dehiscence of amputation stump ([place])     Plan: Patient has complete breakdown of the wound with cellulitis abscess and ulceration down to bone patient will need to proceed with revision of the left transtibial amputation.  Risk and benefits were discussed including risk of persistent infection need for additional surgery patient will need to be hospitalized after revision surgery with placement of IV antibiotics with anticipation of long-term oral antibiotics after discharge.  Follow-Up Instructions: Return in about 1 week (around 09/10/2019).   Ortho Exam  Patient is alert, oriented, no adenopathy, well-dressed, normal affect, normal respiratory effort. Examination patient has cellulitis and purulent drainage in the left transtibial amputation there is an ulcer that is 2 cm in diameter with necrotic tissue that probes down to bone.  Patient does not have any effusion of the knee no signs of infection of the left knee.  Imaging: No results found. No images are attached to the encounter.  Labs: Lab Results  Component Value Date   HGBA1C 7.0 (A)  05/30/2019   HGBA1C 6.6 (A) 02/28/2019   HGBA1C 7.0 (A) 11/08/2018   ESRSEDRATE 130 (H) 03/24/2017   ESRSEDRATE 53 (H) 09/14/2015   ESRSEDRATE 132 (H) 07/06/2012   CRP 13.6 (H) 02/14/2019   CRP 38.6 (H) 04/03/2018   CRP 5.4 (H) 09/14/2015   LABURIC 8.3 (H) 04/15/2019   REPTSTATUS 02/17/2019 FINAL 02/12/2019   REPTSTATUS 02/17/2019 FINAL 02/12/2019   GRAMSTAIN  08/11/2017    MODERATE WBC PRESENT, PREDOMINANTLY PMN FEW GRAM POSITIVE COCCI    CULT  02/12/2019    NO GROWTH 5 DAYS Performed at [facility], [place] [address], [place], [place] [address]    CULT  02/12/2019    NO GROWTH 5 DAYS Performed at [facility] [address], [place], [place] [address]    LABORGA GROUP B STREP(S.AGALACTIAE)ISOLATED 04/03/2018     Lab Results  Component Value Date   ALBUMIN 1.8 (L) 09/01/2019   ALBUMIN 2.1 (L) 02/12/2019   ALBUMIN 2.8 (L) 12/18/2018   LABURIC 8.3 (H) 04/15/2019    Lab Results  Component Value Date   MG 2.3 04/07/2018   MG 2.1 04/05/2018   MG 2.4 03/25/2017   No results found for: VD25OH  No results found for: PREALBUMIN CBC EXTENDED Latest Ref Rng & Units 09/01/2019 04/12/2019 03/19/2019  WBC 4.0 - 10.5 K/uL 6.9 5.6 -  RBC 3.87 - 5.11 MIL/uL  3.20(L) 3.21(L) -  HGB 12.0 - 15.0 g/dL 8.4(L) 8.4(L) 9.9(L)  HCT 36.0 - 46.0 % 27.7(L) 26.5(L) 29.0(L)  PLT 150 - 400 K/uL 270 259 -  NEUTROABS 1.7 - 7.7 K/uL 4.9 - -  LYMPHSABS 0.7 - 4.0 K/uL 1.0 - -     Body mass index is 39.28 kg/m.  Orders:  No orders of the defined types were placed in this encounter.  No orders of the defined types were placed in this encounter.    Procedures: No procedures performed  Clinical Data: No additional findings.  ROS:  All other systems negative, except as noted in the HPI. Review of Systems  Objective: Vital Signs: Ht 5\' 9"  (1.753 m)   Wt 266 lb (120.7 kg)   LMP 08/04/2019   BMI 39.28 kg/m   Specialty Comments:  No specialty comments  available.  PMFS History: Patient Active Problem List   Diagnosis Date Noted  . Right carpal tunnel syndrome 06/14/2019  . Left carpal tunnel syndrome 06/14/2019  . Tobacco use 03/01/2019  . Phantom limb pain ([place]) 11/08/2018  . Current moderate episode of major depressive disorder ([place])   . Anemia of chronic disease   . Severe protein-calorie malnutrition ([place])   . Status post below knee amputation, left ([place]) 04/11/2017  . Bipolar affective disorder ([place])   . Adjustment disorder with depressed mood 03/26/2017  . Heart failure with preserved ejection fraction ([place]) 02/09/2017  . Routine adult health maintenance 12/08/2016  . Chronic pain of right knee 09/08/2016  . Chronic kidney disease (CKD), stage III (moderate) 07/15/2016  . OSA (obstructive sleep apnea) 05/13/2016  . Morbid obesity due to excess calories ([place]) 11/27/2015  . Hypothyroidism 04/25/2013  . Controlled type 2 diabetes mellitus ([place]) 07/12/2012  . Carpal tunnel syndrome, bilateral 01/25/2012  . Diabetic peripheral neuropathy ([place]) 07/04/2011  . Anxiety and depression 06/02/2011  . GERD 12/06/2007  . Hyperlipidemia 08/29/2007  . Hypertension associated with diabetes ([place]) 08/29/2007   Past Medical History:  Diagnosis Date  . Abdominal muscle pain 09/08/2016  . Abnormal Pap smear of cervix 2009  . Abscess    history of multiple abscesses  . Acute bilateral low back pain 02/13/2017  . Acute blood loss anemia   . Acute renal failure ([place]) 07/12/2012  . AKI (acute kidney injury) ([place])   . Anemia of chronic disease 2002  . Anxiety    Panic attacks  . Bilateral lower extremity edema 05/13/2016  . Bipolar disorder ([place])   . Cellulitis 05/21/2014   right eye  . Chronic bronchitis ([place])    "get it q yr" (05/13/2013)  . Chronic pain   . Depression   . Edema of lower extremity   . Endocarditis 2002   subacute bacterial endocarditis.   . Family history of anesthesia complication    "my mom has a hard time coming  out from under"  . Fibromyalgia   . GERD (gastroesophageal reflux disease)    occ  . Heart murmur   . Herpes simplex type 1 infection 01/16/2018  . History of blood transfusion    "just low blood count" (05/13/2013)  . Hyperlipidemia   . Hypertension   . Hypothyroidism   . Hypothyroidism, adult 03/21/2014  . Leukocytosis   . Necrosis ([place])    and ulceration  . Necrotizing fasciitis s/p OR debridements 07/06/2012  . Obesity   . OSA on CPAP    does not wear CPAP  . Peripheral neuropathy   . Type II diabetes  mellitus ([place])    Type  II    Family History  Problem Relation Age of Onset  . Heart failure Mother   . Diabetes Mother   . Kidney disease Mother   . Kidney disease Father   . Diabetes Father   . Diabetes Paternal Grandmother   . Heart failure Paternal Grandmother   . Other Other     Past Surgical History:  Procedure Laterality Date  . ABDOMINAL AORTOGRAM W/LOWER EXTREMITY Left 03/19/2019   Procedure: ABDOMINAL AORTOGRAM W/LOWER EXTREMITY;  Surgeon: Serafina Mitchell, MD;  Location: [facility];  Service: Cardiovascular;  Laterality: Left;  . AMPUTATION Left 04/07/2017   Procedure: LEFT BELOW KNEE AMPUTATION;  Surgeon: Newt Minion, MD;  Location: [facility];  Service: Orthopedics;  Laterality: Left;  . EYE SURGERY     lazer  . INCISION AND DRAINAGE ABSCESS     multiple I&Ds  . INCISION AND DRAINAGE ABSCESS Left 07/09/2012   Procedure: DRESSING CHANGE, THIGH WOUND;  Surgeon: Harl Bowie, MD;  Location: [facility];  Service: General;  Laterality: Left;  . INCISION AND DRAINAGE OF WOUND Left 07/07/2012   Procedure: IRRIGATION AND DEBRIDEMENT WOUND;  Surgeon: Harl Bowie, MD;  Location: [facility];  Service: General;  Laterality: Left;  . INCISION AND DRAINAGE PERIRECTAL ABSCESS Left 07/14/2012   Procedure: DEBRIDEMENT OF SKIN & SOFT TISSUE; DRESSING CHANGE UNDER ANESTHESIA;  Surgeon: Gayland Curry, MD,FACS;  Location: [facility];  Service: General;  Laterality: Left;  .  INCISION AND DRAINAGE PERIRECTAL ABSCESS Left 07/16/2012   Procedure: I&D Left Thigh;  Surgeon: Gwenyth Ober, MD;  Location: [facility];  Service: General;  Laterality: Left;  . INCISION AND DRAINAGE PERIRECTAL ABSCESS N/A 01/05/2015   Procedure: IRRIGATION AND DEBRIDEMENT PERIRECTAL ABSCESS;  Surgeon: Donnie Mesa, MD;  Location: [facility];  Service: General;  Laterality: N/A;  . IRRIGATION AND DEBRIDEMENT ABSCESS Left 07/06/2012   Procedure: IRRIGATION AND DEBRIDEMENT ABSCESS BUTTOCKS AND THIGH;  Surgeon: Shann Medal, MD;  Location: [facility];  Service: General;  Laterality: Left;  . IRRIGATION AND DEBRIDEMENT ABSCESS Left 08/10/2012   Procedure: IRRIGATION AND DEBRIDEMENT ABSCESS;  Surgeon: Madilyn Hook, DO;  Location: [facility];  Service: General;  Laterality: Left;  . STUMP REVISION Left 06/09/2017   Procedure: REVISION LEFT BELOW KNEE AMPUTATION;  Surgeon: Newt Minion, MD;  Location: [facility];  Service: Orthopedics;  Laterality: Left;  . TONSILLECTOMY  1994   Social History   Occupational History  . Occupation: disability  Tobacco Use  . Smoking status: Current Every Day Smoker    Packs/day: 0.50    Years: 16.00    Pack years: 8.00    Types: Cigarettes  . Smokeless tobacco: Never Used  . Tobacco comment: 1 PPD  Substance and Sexual Activity  . Alcohol use: Yes    Alcohol/week: 0.0 standard drinks    Comment: Socially-" monthy  maybe"  . Drug use: No  . Sexual activity: Yes

## 2019-09-03 NOTE — Progress Notes (Signed)
Spoke with pt for pre-op call. Pt has hx of a heart murmur but states she's never had any problems. Pt is a type 2 Diabetic. Last A1C ws 7.0 on 1/7//21. Pt states she does not check her blood sugar at home and does not have a meter. Pt instructed not to take her Victoza in the AM.  Pt will need Covid test on arrival. Pt states she has an appt with her PCP at the [facility] and will come in for surgery when she is done with her appointment.

## 2019-09-04 ENCOUNTER — Encounter (HOSPITAL_COMMUNITY): Payer: Self-pay | Admitting: Orthopedic Surgery

## 2019-09-04 ENCOUNTER — Other Ambulatory Visit: Payer: Self-pay

## 2019-09-04 ENCOUNTER — Encounter: Payer: Self-pay | Admitting: Internal Medicine

## 2019-09-04 ENCOUNTER — Inpatient Hospital Stay (HOSPITAL_COMMUNITY)
Admission: RE | Admit: 2019-09-04 | Discharge: 2019-09-06 | DRG: 475 | Disposition: A | Discharge status: Home Health | Payer: Medicare HMO | Attending: Orthopedic Surgery | Admitting: Orthopedic Surgery

## 2019-09-04 ENCOUNTER — Inpatient Hospital Stay (HOSPITAL_COMMUNITY): Payer: Medicare HMO | Admitting: Certified Registered Nurse Anesthetist

## 2019-09-04 ENCOUNTER — Ambulatory Visit (INDEPENDENT_AMBULATORY_CARE_PROVIDER_SITE_OTHER): Payer: Medicare HMO | Admitting: Internal Medicine

## 2019-09-04 ENCOUNTER — Encounter (HOSPITAL_COMMUNITY)
Admission: RE | Disposition: A | Discharge status: Home Health | Payer: Self-pay | Source: Home / Self Care | Attending: Orthopedic Surgery

## 2019-09-04 VITALS — BP 155/87 | HR 85 | Temp 97.8°F | Ht 69.5 in | Wt 289.8 lb

## 2019-09-04 DIAGNOSIS — F17210 Nicotine dependence, cigarettes, uncomplicated: Secondary | ICD-10-CM | POA: Diagnosis present

## 2019-09-04 DIAGNOSIS — T8789 Other complications of amputation stump: Secondary | ICD-10-CM

## 2019-09-04 DIAGNOSIS — Z79891 Long term (current) use of opiate analgesic: Secondary | ICD-10-CM | POA: Diagnosis not present

## 2019-09-04 DIAGNOSIS — Z79899 Other long term (current) drug therapy: Secondary | ICD-10-CM

## 2019-09-04 DIAGNOSIS — L02416 Cutaneous abscess of left lower limb: Secondary | ICD-10-CM | POA: Diagnosis present

## 2019-09-04 DIAGNOSIS — T8130XA Disruption of wound, unspecified, initial encounter: Secondary | ICD-10-CM | POA: Diagnosis present

## 2019-09-04 DIAGNOSIS — T8781 Dehiscence of amputation stump: Secondary | ICD-10-CM | POA: Diagnosis not present

## 2019-09-04 DIAGNOSIS — T8744 Infection of amputation stump, left lower extremity: Principal | ICD-10-CM | POA: Diagnosis present

## 2019-09-04 DIAGNOSIS — I10 Essential (primary) hypertension: Secondary | ICD-10-CM | POA: Diagnosis present

## 2019-09-04 DIAGNOSIS — M869 Osteomyelitis, unspecified: Secondary | ICD-10-CM | POA: Diagnosis present

## 2019-09-04 DIAGNOSIS — E1169 Type 2 diabetes mellitus with other specified complication: Secondary | ICD-10-CM | POA: Diagnosis present

## 2019-09-04 DIAGNOSIS — K219 Gastro-esophageal reflux disease without esophagitis: Secondary | ICD-10-CM | POA: Diagnosis present

## 2019-09-04 DIAGNOSIS — Y835 Amputation of limb(s) as the cause of abnormal reaction of the patient, or of later complication, without mention of misadventure at the time of the procedure: Secondary | ICD-10-CM | POA: Diagnosis present

## 2019-09-04 DIAGNOSIS — Z20822 Contact with and (suspected) exposure to covid-19: Secondary | ICD-10-CM | POA: Diagnosis present

## 2019-09-04 DIAGNOSIS — E785 Hyperlipidemia, unspecified: Secondary | ICD-10-CM | POA: Diagnosis present

## 2019-09-04 DIAGNOSIS — Z6841 Body Mass Index (BMI) 40.0 and over, adult: Secondary | ICD-10-CM

## 2019-09-04 DIAGNOSIS — F419 Anxiety disorder, unspecified: Secondary | ICD-10-CM | POA: Diagnosis present

## 2019-09-04 DIAGNOSIS — F329 Major depressive disorder, single episode, unspecified: Secondary | ICD-10-CM | POA: Diagnosis present

## 2019-09-04 DIAGNOSIS — Z89512 Acquired absence of left leg below knee: Secondary | ICD-10-CM

## 2019-09-04 DIAGNOSIS — Z841 Family history of disorders of kidney and ureter: Secondary | ICD-10-CM

## 2019-09-04 DIAGNOSIS — Z833 Family history of diabetes mellitus: Secondary | ICD-10-CM

## 2019-09-04 DIAGNOSIS — Z8249 Family history of ischemic heart disease and other diseases of the circulatory system: Secondary | ICD-10-CM

## 2019-09-04 HISTORY — PX: STUMP REVISION: SHX6102

## 2019-09-04 HISTORY — DX: Disruption of wound, unspecified, initial encounter: T81.30XA

## 2019-09-04 LAB — GLUCOSE, CAPILLARY
Glucose-Capillary: 143 mg/dL — ABNORMAL HIGH (ref 70–99)
Glucose-Capillary: 174 mg/dL — ABNORMAL HIGH (ref 70–99)
Glucose-Capillary: 246 mg/dL — ABNORMAL HIGH (ref 70–99)
Glucose-Capillary: 263 mg/dL — ABNORMAL HIGH (ref 70–99)
Glucose-Capillary: 267 mg/dL — ABNORMAL HIGH (ref 70–99)

## 2019-09-04 LAB — RESPIRATORY PANEL BY RT PCR (FLU A&B, COVID)
Influenza A by PCR: NEGATIVE
Influenza B by PCR: NEGATIVE
SARS Coronavirus 2 by RT PCR: NEGATIVE

## 2019-09-04 LAB — BASIC METABOLIC PANEL
Anion gap: 6 (ref 5–15)
Anion gap: 7 (ref 5–15)
BUN: 33 mg/dL — ABNORMAL HIGH (ref 6–20)
BUN: 31 mg/dL — ABNORMAL HIGH (ref 6–20)
CO2: 22 mmol/L (ref 22–32)
CO2: 23 mmol/L (ref 22–32)
Calcium: 8.6 mg/dL — ABNORMAL LOW (ref 8.9–10.3)
Calcium: 8.6 mg/dL — ABNORMAL LOW (ref 8.9–10.3)
Chloride: 110 mmol/L (ref 98–111)
Chloride: 108 mmol/L (ref 98–111)
Creatinine, Ser: 2.37 mg/dL — ABNORMAL HIGH (ref 0.44–1.00)
Creatinine, Ser: 2.26 mg/dL — ABNORMAL HIGH (ref 0.44–1.00)
GFR calc Af Amer: 28 mL/min — ABNORMAL LOW (ref >60)
GFR calc Af Amer: 30 mL/min — ABNORMAL LOW (ref >60)
GFR calc non Af Amer: 24 mL/min — ABNORMAL LOW (ref >60)
GFR calc non Af Amer: 26 mL/min — ABNORMAL LOW (ref >60)
Glucose, Bld: 193 mg/dL — ABNORMAL HIGH (ref 70–99)
Glucose, Bld: 164 mg/dL — ABNORMAL HIGH (ref 70–99)
Potassium: 5.6 mmol/L — ABNORMAL HIGH (ref 3.5–5.1)
Potassium: 4.9 mmol/L (ref 3.5–5.1)
Sodium: 138 mmol/L (ref 135–145)
Sodium: 138 mmol/L (ref 135–145)

## 2019-09-04 LAB — POCT PREGNANCY, URINE: Preg Test, Ur: NEGATIVE

## 2019-09-04 LAB — HEMOGLOBIN A1C
Hgb A1c MFr Bld: 8.5 % — ABNORMAL HIGH (ref 4.8–5.6)
Mean Plasma Glucose: 197.25 mg/dL

## 2019-09-04 SURGERY — REVISION, AMPUTATION SITE
Anesthesia: General | Laterality: Left

## 2019-09-04 MED ORDER — SODIUM CHLORIDE 0.9 % IV SOLN: INTRAVENOUS | Status: DC

## 2019-09-04 MED ORDER — ACETAMINOPHEN 325 MG PO TABS: 325.0000 mg | ORAL_TABLET | Freq: Four times a day (QID) | ORAL | Status: DC | PRN

## 2019-09-04 MED ORDER — AMITRIPTYLINE HCL 50 MG PO TABS
50.0000 mg | ORAL_TABLET | Freq: Every day | ORAL | Status: DC
  Administered 2019-09-04 – 2019-09-05 (×2): 50 mg via ORAL
  Filled 2019-09-04 (×2): qty 1

## 2019-09-04 MED ORDER — ACETAMINOPHEN 10 MG/ML IV SOLN
INTRAVENOUS | Status: AC
  Administered 2019-09-04: 1000 mg
  Filled 2019-09-04: qty 100

## 2019-09-04 MED ORDER — METOCLOPRAMIDE HCL 5 MG/ML IJ SOLN: 5.0000 mg | Freq: Three times a day (TID) | INTRAMUSCULAR | Status: DC | PRN

## 2019-09-04 MED ORDER — INSULIN ASPART 100 UNIT/ML ~~LOC~~ SOLN
0.0000 [IU] | Freq: Three times a day (TID) | SUBCUTANEOUS | Status: DC
  Administered 2019-09-05 (×2): 15 [IU] via SUBCUTANEOUS
  Administered 2019-09-06: 13:00:00 5 [IU] via SUBCUTANEOUS
  Administered 2019-09-06: 08:00:00 3 [IU] via SUBCUTANEOUS

## 2019-09-04 MED ORDER — OXYCODONE HCL 5 MG PO TABS: 5.0000 mg | ORAL_TABLET | Freq: Once | ORAL | Status: DC | PRN

## 2019-09-04 MED ORDER — INSULIN ASPART 100 UNIT/ML ~~LOC~~ SOLN
5.0000 [IU] | Freq: Once | SUBCUTANEOUS | Status: AC
  Administered 2019-09-04: 19:00:00 5 [IU] via SUBCUTANEOUS

## 2019-09-04 MED ORDER — PREGABALIN 25 MG PO CAPS
50.0000 mg | ORAL_CAPSULE | Freq: Two times a day (BID) | ORAL | Status: DC
  Administered 2019-09-04 – 2019-09-06 (×4): 50 mg via ORAL
  Filled 2019-09-04 (×4): qty 2

## 2019-09-04 MED ORDER — METOCLOPRAMIDE HCL 5 MG PO TABS: 5.0000 mg | ORAL_TABLET | Freq: Three times a day (TID) | ORAL | Status: DC | PRN

## 2019-09-04 MED ORDER — FLUOXETINE HCL 20 MG PO CAPS
60.0000 mg | ORAL_CAPSULE | Freq: Two times a day (BID) | ORAL | Status: DC
  Administered 2019-09-04 – 2019-09-06 (×4): 60 mg via ORAL
  Filled 2019-09-04 (×4): qty 3

## 2019-09-04 MED ORDER — PROPOFOL 10 MG/ML IV BOLUS
INTRAVENOUS | Status: DC | PRN
  Administered 2019-09-04: 200 mg via INTRAVENOUS

## 2019-09-04 MED ORDER — HYDROMORPHONE HCL 1 MG/ML IJ SOLN
0.2500 mg | INTRAMUSCULAR | Status: DC | PRN
  Administered 2019-09-04 (×4): 0.5 mg via INTRAVENOUS

## 2019-09-04 MED ORDER — FENTANYL CITRATE (PF) 100 MCG/2ML IJ SOLN
25.0000 ug | INTRAMUSCULAR | Status: DC | PRN
  Administered 2019-09-04 (×3): 50 ug via INTRAVENOUS

## 2019-09-04 MED ORDER — FENTANYL CITRATE (PF) 250 MCG/5ML IJ SOLN
INTRAMUSCULAR | Status: AC
  Filled 2019-09-04: qty 5

## 2019-09-04 MED ORDER — ALBUMIN HUMAN 5 % IV SOLN
12.5000 g | Freq: Once | INTRAVENOUS | Status: AC
  Administered 2019-09-04: 19:00:00 12.5 g via INTRAVENOUS

## 2019-09-04 MED ORDER — HYDROMORPHONE HCL 1 MG/ML IJ SOLN
INTRAMUSCULAR | Status: AC
  Filled 2019-09-04: qty 1

## 2019-09-04 MED ORDER — ONDANSETRON HCL 4 MG PO TABS: 4.0000 mg | ORAL_TABLET | Freq: Four times a day (QID) | ORAL | Status: DC | PRN

## 2019-09-04 MED ORDER — 0.9 % SODIUM CHLORIDE (POUR BTL) OPTIME
TOPICAL | Status: DC | PRN
  Administered 2019-09-04: 1000 mL

## 2019-09-04 MED ORDER — MIDAZOLAM HCL 2 MG/2ML IJ SOLN
INTRAMUSCULAR | Status: DC | PRN
  Administered 2019-09-04: 1 mg via INTRAVENOUS

## 2019-09-04 MED ORDER — DOCUSATE SODIUM 100 MG PO CAPS
100.0000 mg | ORAL_CAPSULE | Freq: Two times a day (BID) | ORAL | Status: DC
  Administered 2019-09-04 – 2019-09-06 (×4): 100 mg via ORAL
  Filled 2019-09-04 (×4): qty 1

## 2019-09-04 MED ORDER — FENTANYL CITRATE (PF) 100 MCG/2ML IJ SOLN
INTRAMUSCULAR | Status: DC | PRN
  Administered 2019-09-04: 25 ug via INTRAVENOUS
  Administered 2019-09-04: 50 ug via INTRAVENOUS
  Administered 2019-09-04: 25 ug via INTRAVENOUS
  Administered 2019-09-04: 50 ug via INTRAVENOUS

## 2019-09-04 MED ORDER — AMLODIPINE BESYLATE 10 MG PO TABS
10.0000 mg | ORAL_TABLET | Freq: Every day | ORAL | Status: DC
  Administered 2019-09-04 – 2019-09-06 (×3): 10 mg via ORAL
  Filled 2019-09-04 (×3): qty 1

## 2019-09-04 MED ORDER — LISINOPRIL 10 MG PO TABS
10.0000 mg | ORAL_TABLET | Freq: Every day | ORAL | Status: DC
  Administered 2019-09-05 – 2019-09-06 (×2): 10 mg via ORAL
  Filled 2019-09-04 (×2): qty 1

## 2019-09-04 MED ORDER — DEXTROSE 5 % IV SOLN
3.0000 g | INTRAVENOUS | Status: AC
  Administered 2019-09-04: 3 g via INTRAVENOUS
  Filled 2019-09-04: qty 3

## 2019-09-04 MED ORDER — ONDANSETRON HCL 4 MG/2ML IJ SOLN
INTRAMUSCULAR | Status: DC | PRN
  Administered 2019-09-04: 4 mg via INTRAVENOUS

## 2019-09-04 MED ORDER — PROMETHAZINE HCL 25 MG/ML IJ SOLN: 6.2500 mg | INTRAMUSCULAR | Status: DC | PRN

## 2019-09-04 MED ORDER — INSULIN ASPART 100 UNIT/ML ~~LOC~~ SOLN
SUBCUTANEOUS | Status: AC
  Administered 2019-09-04: 5 [IU]
  Filled 2019-09-04: qty 1

## 2019-09-04 MED ORDER — HYDROMORPHONE HCL 1 MG/ML IJ SOLN
0.5000 mg | INTRAMUSCULAR | Status: DC | PRN
  Administered 2019-09-04: 0.5 mg via INTRAVENOUS
  Filled 2019-09-04: qty 1

## 2019-09-04 MED ORDER — FENTANYL CITRATE (PF) 100 MCG/2ML IJ SOLN
INTRAMUSCULAR | Status: AC
  Filled 2019-09-04: qty 2

## 2019-09-04 MED ORDER — DEXAMETHASONE SODIUM PHOSPHATE 10 MG/ML IJ SOLN
INTRAMUSCULAR | Status: DC | PRN
  Administered 2019-09-04: 5 mg via INTRAVENOUS

## 2019-09-04 MED ORDER — ACETAMINOPHEN 10 MG/ML IV SOLN: 1000.0000 mg | Freq: Once | INTRAVENOUS | Status: DC

## 2019-09-04 MED ORDER — ONDANSETRON HCL 4 MG/2ML IJ SOLN: 4.0000 mg | Freq: Four times a day (QID) | INTRAMUSCULAR | Status: DC | PRN

## 2019-09-04 MED ORDER — METOPROLOL TARTRATE 25 MG PO TABS
12.5000 mg | ORAL_TABLET | Freq: Two times a day (BID) | ORAL | Status: DC
  Administered 2019-09-04 – 2019-09-06 (×4): 12.5 mg via ORAL
  Filled 2019-09-04 (×4): qty 1

## 2019-09-04 MED ORDER — OXYCODONE HCL 5 MG/5ML PO SOLN: 5.0000 mg | Freq: Once | ORAL | Status: DC | PRN

## 2019-09-04 MED ORDER — MIDAZOLAM HCL 2 MG/2ML IJ SOLN
INTRAMUSCULAR | Status: AC
  Filled 2019-09-04: qty 2

## 2019-09-04 MED ORDER — ASPIRIN EC 325 MG PO TBEC
325.0000 mg | DELAYED_RELEASE_TABLET | Freq: Every day | ORAL | Status: DC
  Administered 2019-09-04 – 2019-09-06 (×3): 325 mg via ORAL
  Filled 2019-09-04 (×3): qty 1

## 2019-09-04 MED ORDER — OXYCODONE HCL 5 MG PO TABS
5.0000 mg | ORAL_TABLET | ORAL | Status: DC | PRN
  Administered 2019-09-05 (×3): 10 mg via ORAL
  Administered 2019-09-06: 5 mg via ORAL
  Filled 2019-09-04 (×4): qty 2

## 2019-09-04 MED ORDER — ATORVASTATIN CALCIUM 10 MG PO TABS
20.0000 mg | ORAL_TABLET | Freq: Every day | ORAL | Status: DC
  Administered 2019-09-04 – 2019-09-06 (×3): 20 mg via ORAL
  Filled 2019-09-04 (×3): qty 2

## 2019-09-04 MED ORDER — CEFAZOLIN SODIUM-DEXTROSE 2-4 GM/100ML-% IV SOLN
2.0000 g | Freq: Four times a day (QID) | INTRAVENOUS | Status: DC
  Administered 2019-09-04 – 2019-09-05 (×2): 2 g via INTRAVENOUS
  Filled 2019-09-04 (×2): qty 100

## 2019-09-04 MED ORDER — LIDOCAINE HCL (CARDIAC) PF 100 MG/5ML IV SOSY
PREFILLED_SYRINGE | INTRAVENOUS | Status: DC | PRN
  Administered 2019-09-04: 100 mg via INTRAVENOUS

## 2019-09-04 SURGICAL SUPPLY — 31 items
BLADE SAW RECIP 87.9 MT (BLADE) IMPLANT
BLADE SURG 21 STRL SS (BLADE) ×3 IMPLANT
CANISTER WOUND CARE 500ML ATS (WOUND CARE) ×3 IMPLANT
COVER SURGICAL LIGHT HANDLE (MISCELLANEOUS) ×3 IMPLANT
COVER WAND RF STERILE (DRAPES) ×3 IMPLANT
DRAPE EXTREMITY T 121X128X90 (DISPOSABLE) ×3 IMPLANT
DRAPE HALF SHEET 40X57 (DRAPES) ×3 IMPLANT
DRAPE INCISE IOBAN 66X45 STRL (DRAPES) ×3 IMPLANT
DRAPE U-SHAPE 47X51 STRL (DRAPES) ×6 IMPLANT
DRESSING PREVENA PLUS CUSTOM (GAUZE/BANDAGES/DRESSINGS) ×1 IMPLANT
DRSG PREVENA PLUS CUSTOM (GAUZE/BANDAGES/DRESSINGS) ×3
DURAPREP 26ML APPLICATOR (WOUND CARE) ×3 IMPLANT
ELECT REM PT RETURN 9FT ADLT (ELECTROSURGICAL) ×3
ELECTRODE REM PT RTRN 9FT ADLT (ELECTROSURGICAL) ×1 IMPLANT
GLOVE BIOGEL PI IND STRL 9 (GLOVE) ×1 IMPLANT
GLOVE BIOGEL PI INDICATOR 9 (GLOVE) ×2
GLOVE SURG ORTHO 9.0 STRL STRW (GLOVE) ×3 IMPLANT
GOWN STRL REUS W/ TWL XL LVL3 (GOWN DISPOSABLE) ×2 IMPLANT
GOWN STRL REUS W/TWL XL LVL3 (GOWN DISPOSABLE) ×6
KIT BASIN OR (CUSTOM PROCEDURE TRAY) ×3 IMPLANT
KIT TURNOVER KIT B (KITS) ×3 IMPLANT
MANIFOLD NEPTUNE II (INSTRUMENTS) ×3 IMPLANT
NS IRRIG 1000ML POUR BTL (IV SOLUTION) ×3 IMPLANT
PACK GENERAL/GYN (CUSTOM PROCEDURE TRAY) ×3 IMPLANT
PAD ARMBOARD 7.5X6 YLW CONV (MISCELLANEOUS) ×3 IMPLANT
PREVENA RESTOR ARTHOFORM 46X30 (CANNISTER) ×3 IMPLANT
STAPLER VISISTAT 35W (STAPLE) IMPLANT
SUT ETHILON 2 0 PSLX (SUTURE) ×6 IMPLANT
SUT SILK 2 0 (SUTURE)
SUT SILK 2-0 18XBRD TIE 12 (SUTURE) IMPLANT
TOWEL GREEN STERILE (TOWEL DISPOSABLE) ×3 IMPLANT

## 2019-09-04 NOTE — H&P (Signed)
[name] [name] is an 44 y.o. female.   Chief Complaint: Left Below Knee Amputation Wound Dehiscence HPI:  Patient is a 44 year old woman who is status post a left transtibial amputation.  Patient states she went to the emergency room 2 days ago with a large draining ulcer she states she was advised that this was a fungal infection that she had athlete's foot of the residual limb and she was given a prescription for Lamisil tablets.  Patient states that she has been having fever and chills nausea and vomiting and cellulitis in the residual limb. Past Medical History:  Diagnosis Date  . Abdominal muscle pain 09/08/2016  . Abnormal Pap smear of cervix 2009  . Abscess    history of multiple abscesses  . Acute bilateral low back pain 02/13/2017  . Acute blood loss anemia   . Acute renal failure ([place]) 07/12/2012  . AKI (acute kidney injury) ([place])   . Anemia of chronic disease 2002  . Anxiety    Panic attacks  . Bilateral lower extremity edema 05/13/2016  . Bipolar disorder ([place])   . Cellulitis 05/21/2014   right eye  . Chronic bronchitis ([place])    "get it q yr" (05/13/2013)  . Chronic pain   . Depression   . Edema of lower extremity   . Endocarditis 2002   subacute bacterial endocarditis.   . Family history of anesthesia complication    "my mom has a hard time coming out from under"  . Fibromyalgia   . GERD (gastroesophageal reflux disease)    occ  . Heart murmur   . Herpes simplex type 1 infection 01/16/2018  . History of blood transfusion    "just low blood count" (05/13/2013)  . Hyperlipidemia   . Hypertension   . Hypothyroidism   . Hypothyroidism, adult 03/21/2014  . Leukocytosis   . Necrosis ([place])    and ulceration  . Necrotizing fasciitis s/p OR debridements 07/06/2012  . Obesity   . OSA on CPAP    does not wear all the time  . Peripheral neuropathy   . Type II diabetes mellitus ([place])    Type  II    Past Surgical History:  Procedure Laterality Date  .  ABDOMINAL AORTOGRAM W/LOWER EXTREMITY Left 03/19/2019   Procedure: ABDOMINAL AORTOGRAM W/LOWER EXTREMITY;  Surgeon: Serafina Mitchell, MD;  Location: [facility];  Service: Cardiovascular;  Laterality: Left;  . AMPUTATION Left 04/07/2017   Procedure: LEFT BELOW KNEE AMPUTATION;  Surgeon: Newt Minion, MD;  Location: [facility];  Service: Orthopedics;  Laterality: Left;  . EYE SURGERY     lazer  . INCISION AND DRAINAGE ABSCESS     multiple I&Ds  . INCISION AND DRAINAGE ABSCESS Left 07/09/2012   Procedure: DRESSING CHANGE, THIGH WOUND;  Surgeon: Harl Bowie, MD;  Location: [facility];  Service: General;  Laterality: Left;  . INCISION AND DRAINAGE OF WOUND Left 07/07/2012   Procedure: IRRIGATION AND DEBRIDEMENT WOUND;  Surgeon: Harl Bowie, MD;  Location: [facility];  Service: General;  Laterality: Left;  . INCISION AND DRAINAGE PERIRECTAL ABSCESS Left 07/14/2012   Procedure: DEBRIDEMENT OF SKIN & SOFT TISSUE; DRESSING CHANGE UNDER ANESTHESIA;  Surgeon: Gayland Curry, MD,FACS;  Location: [facility];  Service: General;  Laterality: Left;  . INCISION AND DRAINAGE PERIRECTAL ABSCESS Left 07/16/2012   Procedure: I&D Left Thigh;  Surgeon: Gwenyth Ober, MD;  Location: [facility];  Service: General;  Laterality: Left;  . INCISION AND DRAINAGE PERIRECTAL  ABSCESS N/A 01/05/2015   Procedure: IRRIGATION AND DEBRIDEMENT PERIRECTAL ABSCESS;  Surgeon: Donnie Mesa, MD;  Location: [facility];  Service: General;  Laterality: N/A;  . IRRIGATION AND DEBRIDEMENT ABSCESS Left 07/06/2012   Procedure: IRRIGATION AND DEBRIDEMENT ABSCESS BUTTOCKS AND THIGH;  Surgeon: Shann Medal, MD;  Location: [facility];  Service: General;  Laterality: Left;  . IRRIGATION AND DEBRIDEMENT ABSCESS Left 08/10/2012   Procedure: IRRIGATION AND DEBRIDEMENT ABSCESS;  Surgeon: Madilyn Hook, DO;  Location: [facility];  Service: General;  Laterality: Left;  . STUMP REVISION Left 06/09/2017   Procedure: REVISION LEFT BELOW KNEE AMPUTATION;  Surgeon: Newt Minion,  MD;  Location: [facility];  Service: Orthopedics;  Laterality: Left;  . TONSILLECTOMY  1994    Family History  Problem Relation Age of Onset  . Heart failure Mother   . Diabetes Mother   . Kidney disease Mother   . Kidney disease Father   . Diabetes Father   . Diabetes Paternal Grandmother   . Heart failure Paternal Grandmother   . Other Other    Social History:  reports that she has been smoking cigarettes. She has a 8.00 pack-year smoking history. She has never used smokeless tobacco. She reports current alcohol use. She reports that she does not use drugs.  Allergies:  Allergies  Allergen Reactions  . Vancomycin Other (See Comments)    Acute renal failure suspected secondary to vanco    No medications prior to admission.    No results found for this or any previous visit (from the past 48 hour(s)). No results found.  Review of Systems  All other systems reviewed and are negative.   There were no vitals taken for this visit. Physical Exam  Patient is alert, oriented, no adenopathy, well-dressed, normal affect, normal respiratory effort. Examination patient has cellulitis and purulent drainage in the left transtibial amputation there is an ulcer that is 2 cm in diameter with necrotic tissue that probes down to bone.  Patient does not have any effusion of the knee no signs of infection of the left knee. Assessment/Plan 1. Dehiscence of amputation stump ([place])     Plan: Patient has complete breakdown of the wound with cellulitis abscess and ulceration down to bone patient will need to proceed with revision of the left transtibial amputation.  Risk and benefits were discussed including risk of persistent infection need for additional surgery patient will need to be hospitalized after revision surgery with placement of IV antibiotics with anticipation of long-term oral antibiotics after discharge.   [name] Palmer Nicky Milhouse, PA 09/04/2019, 7:08 AM

## 2019-09-04 NOTE — Plan of Care (Signed)

## 2019-09-04 NOTE — Anesthesia Postprocedure Evaluation (Signed)
Anesthesia Post Note  Patient: [name] [name]  Procedure(s) Performed: LEFT BELOW KNEE AMPUTATION REVISION (Left )     Patient location during evaluation: PACU Anesthesia Type: General Level of consciousness: awake and alert Pain management: pain level controlled Vital Signs Assessment: post-procedure vital signs reviewed and stable Respiratory status: spontaneous breathing, nonlabored ventilation, respiratory function stable and patient connected to nasal cannula oxygen Cardiovascular status: blood pressure returned to baseline and stable Postop Assessment: no apparent nausea or vomiting Anesthetic complications: no    Last Vitals:  Vitals:   09/04/19 1343  BP: 134/66  Pulse: 82  Temp: 36.4 C  SpO2: 100%    Last Pain:  Vitals:   09/04/19 1630  TempSrc:   PainSc: 9                  Othmar Ringer S

## 2019-09-04 NOTE — Anesthesia Preprocedure Evaluation (Signed)
Anesthesia Evaluation  Patient identified by MRN, date of birth, ID band Patient awake    Reviewed: Allergy & Precautions, NPO status , Patient's Chart, lab work & pertinent test results  Airway Mallampati: II  TM Distance: >3 FB Neck ROM: Full    Dental no notable dental hx.    Pulmonary sleep apnea , Current Smoker and Patient abstained from smoking.,    Pulmonary exam normal breath sounds clear to auscultation       Cardiovascular hypertension, Pt. on medications and Pt. on home beta blockers Normal cardiovascular exam Rhythm:Regular Rate:Normal     Neuro/Psych negative neurological ROS  negative psych ROS   GI/Hepatic negative GI ROS, Neg liver ROS,   Endo/Other  diabetesHypothyroidism Morbid obesity  Renal/GU negative Renal ROS  negative genitourinary   Musculoskeletal negative musculoskeletal ROS (+)   Abdominal (+) + obese,   Peds negative pediatric ROS (+)  Hematology  (+) anemia ,   Anesthesia Other Findings   Reproductive/Obstetrics negative OB ROS                             Anesthesia Physical Anesthesia Plan  ASA: III  Anesthesia Plan: General   Post-op Pain Management:    Induction: Intravenous  PONV Risk Score and Plan: 2 and Ondansetron, Dexamethasone and Treatment may vary due to age or medical condition  Airway Management Planned: LMA  Additional Equipment:   Intra-op Plan:   Post-operative Plan: Extubation in OR  Informed Consent: I have reviewed the patients History and Physical, chart, labs and discussed the procedure including the risks, benefits and alternatives for the proposed anesthesia with the patient or authorized representative who has indicated his/her understanding and acceptance.     Dental advisory given  Plan Discussed with: CRNA and Surgeon  Anesthesia Plan Comments:         Anesthesia Quick Evaluation

## 2019-09-04 NOTE — Patient Instructions (Addendum)
[name] [name],  It was nice meeting you today. I am sorry that you are continuing to have pain and cramping in your legs. Part of your pain is related to the infection in your left leg, which should improve after your surgery today. Please let your surgeons know if you continue to have worsening pain post-operatively so that they can treat you appropriately.  Good luck with surgery this afternoon!  Please follow-up with your PCP on May 13th.  Thank you for letting us be a part of your care!

## 2019-09-04 NOTE — Assessment & Plan Note (Addendum)
Pt presents today for pain in her L BKA stump. Was seen in the ED on 4/11 for a wound infection where her presentation and examination was felt to be due to candidal dermatitis and she was instructed to take terbinafine. However, on assessment by orthopedics on 4/13, pt's stump wound was felt to be dehiscence and cellulitis with ulceration down to the bone. She is scheduled to receive a BKA revision this afternoon on 4/14 at 3PM.  This AM, pt states her pain as worsened due to the infection and she is taking 2-3 tablets of her home oxycodone-acetaminophen 5-325mg  daily (an increased dose from her prescribed 1-2 tablets). She also endorses severe RLE cramping last evening, lasting for >4hrs which prevented her from sleeping. Denies fevers, chills, nausea or vomiting in last 24hr. Pt is anxious regarding the surgery this afternoon and prayed with nursing staff.  Discussed with pt that acute worsening of pain is likely related to infection and will defer any additional pain medication pre-operatively. Pt is to be admitted to the hospital after her surgery. Instructed her to follow-up with orthopedics regarding acute pain.   Stat BMP obtained for severe R sided muscle cramping and given pt's CKD. K returned mildly elevated at 5.6. Called hospital lab which confirmed no hemolysis. Repeat BMP in process for confirmation, given pt without history of hyperkalemia and K was normal at 4.6 three days ago. Discussed case with orthopedics, who felt her hyperkalemia was secondary to necrotic muscle breakdown and will proceed with surgery as planned this afternoon.  - pt transported to pre-op area by clinic nursing staff - repeat BMP pending - has PCP follow-up scheduled on 5/13  ADDENDUM - repeat K within normal limits at 4.9  BMP Latest Ref Rng & Units 09/04/2019 09/04/2019 09/01/2019  Glucose 70 - 99 mg/dL 164(H) 193(H) 164(H)  BUN 6 - 20 mg/dL 31(H) 33(H) 34(H)  Creatinine 0.44 - 1.00 mg/dL 2.26(H) 2.37(H)  2.36(H)  BUN/Creat Ratio 9 - 23 - - -  Sodium 135 - 145 mmol/L 138 138 135  Potassium 3.5 - 5.1 mmol/L 4.9 5.6(H) 4.6  Chloride 98 - 111 mmol/L 108 110 106  CO2 22 - 32 mmol/L 23 22 20(L)  Calcium 8.9 - 10.3 mg/dL 8.6(L) 8.6(L) 8.1(L)

## 2019-09-04 NOTE — Progress Notes (Signed)
   CC: pain of L BKA  HPI: [name] is a 44 y.o. F with significant PMH as outlined below, who presents for left BKA pain. She is scheduled to have a BKA revision this afternoon for stump wound with cellulitis and ulceration down to the done with Dr. Sharol Given. Please see problem-based charting below.  Past Medical History:  Diagnosis Date  . Abdominal muscle pain 09/08/2016  . Abnormal Pap smear of cervix 2009  . Abscess    history of multiple abscesses  . Acute bilateral low back pain 02/13/2017  . Acute blood loss anemia   . Acute renal failure ([place]) 07/12/2012  . AKI (acute kidney injury) ([place])   . Anemia of chronic disease 2002  . Anxiety    Panic attacks  . Bilateral lower extremity edema 05/13/2016  . Bipolar disorder ([place])   . Cellulitis 05/21/2014   right eye  . Chronic bronchitis ([place])    "get it q yr" (05/13/2013)  . Chronic pain   . Depression   . Edema of lower extremity   . Endocarditis 2002   subacute bacterial endocarditis.   . Family history of anesthesia complication    "my mom has a hard time coming out from under"  . Fibromyalgia   . GERD (gastroesophageal reflux disease)    occ  . Heart murmur   . Herpes simplex type 1 infection 01/16/2018  . History of blood transfusion    "just low blood count" (05/13/2013)  . Hyperlipidemia   . Hypertension   . Hypothyroidism   . Hypothyroidism, adult 03/21/2014  . Leukocytosis   . Necrosis ([place])    and ulceration  . Necrotizing fasciitis s/p OR debridements 07/06/2012  . Obesity   . OSA on CPAP    does not wear all the time  . Peripheral neuropathy   . Type II diabetes mellitus ([facility])    Type  II   Review of Systems:   Review of Systems  Constitutional: Positive for malaise/fatigue. Negative for chills and fever.  Respiratory: Negative for shortness of breath.   Cardiovascular: Negative for chest pain and palpitations.  Gastrointestinal: Negative for abdominal pain, nausea and vomiting.   Musculoskeletal:       + L stump pain and R leg cramps   Physical Exam:  Vitals:   09/04/19 1002  BP: (!) 155/87  Pulse: 85  Temp: 97.8 F (36.6 C)  TempSrc: Oral  SpO2: 100%  Weight: 289 lb 12.8 oz (131.5 kg)  Height: 5' 9.5" (1.765 m)   Physical Exam Vitals and nursing note reviewed.  Constitutional:      General: She is not in acute distress.    Appearance: Normal appearance. She is obese. She is not ill-appearing.  Pulmonary:     Effort: Pulmonary effort is normal.  Musculoskeletal:     Comments: S/p left BKA  Neurological:     Mental Status: She is alert.  Psychiatric:        Mood and Affect: Mood is anxious and depressed.    Assessment & Plan:   See Encounters Tab for problem based charting.  Patient discussed with Dr. Heber Theba

## 2019-09-04 NOTE — Anesthesia Procedure Notes (Signed)
Procedure Name: LMA Insertion Date/Time: 09/04/2019 3:51 PM Performed by: Raenette Rover, CRNA Pre-anesthesia Checklist: Patient identified, Emergency Drugs available, Suction available and Patient being monitored Patient Re-evaluated:Patient Re-evaluated prior to induction Oxygen Delivery Method: Circle system utilized Preoxygenation: Pre-oxygenation with 100% oxygen Induction Type: IV induction LMA: LMA inserted LMA Size: 4.0 Number of attempts: 1 Placement Confirmation: positive ETCO2 and breath sounds checked- equal and bilateral Tube secured with: Tape Dental Injury: Teeth and Oropharynx as per pre-operative assessment

## 2019-09-04 NOTE — Op Note (Signed)
09/04/2019  4:33 PM  PATIENT:  [name] [name]    PRE-OPERATIVE DIAGNOSIS:  abscess osteomyelitis left below knee amputation  POST-OPERATIVE DIAGNOSIS:  Same  PROCEDURE:  LEFT BELOW KNEE AMPUTATION REVISION Application of Prevena customizable and Arthur form wound VAC.  SURGEON:  Newt Minion, MD  PHYSICIAN ASSISTANT:None ANESTHESIA:   General  PREOPERATIVE INDICATIONS:  [name] is a  44 y.o. female with a diagnosis of abscess osteomyelitis left below knee amputation who failed conservative measures and elected for surgical management.    The risks benefits and alternatives were discussed with the patient preoperatively including but not limited to the risks of infection, bleeding, nerve injury, cardiopulmonary complications, the need for revision surgery, among others, and the patient was willing to proceed.  OPERATIVE IMPLANTS: Praveena dressings x2  @ENCIMAGES @  OPERATIVE FINDINGS: Large area of abscess and necrotic tissue this was sent for cultures.  This extended down to bone.  OPERATIVE PROCEDURE: Patient was brought the operating room underwent a general anesthetic.  After adequate levels anesthesia were obtained patient's left lower extremity was prepped using DuraPrep draped into a sterile field a timeout was called.  A fishmouth incision was made around the ulcerative tissue this was carried down to bone there was abscess necrotic tissue that was resected in 1 block of tissue and sent for cultures.  The margins were clear.  Approximately 2 cm of the distal tibia and fibula were resected.  There was good bleeding bone no evidence of any deep osteomyelitis.  The wound was irrigated with normal saline electrocautery was used hemostasis.  The incision was closed using 2-0 nylon.  A Praveena customizable and Arnell Sieving form wound VAC was applied this had a good suction fit.  Patient was extubated taken the PACU in stable condition.   DISCHARGE PLANNING:  Antibiotic  duration: Antibiotics for 24 hours then transition to oral antibiotics pending cultures  Weightbearing: Nonweightbearing on the left  Pain medication: Opioid pathway  Dressing care/ Wound VAC: Wound VAC for 1 week  Ambulatory devices: Walker  Discharge to: Anticipate discharge to home tomorrow  Follow-up: In the office 1 week post operative.

## 2019-09-04 NOTE — Transfer of Care (Signed)
Immediate Anesthesia Transfer of Care Note  Patient: [name] [name]  Procedure(s) Performed: LEFT BELOW KNEE AMPUTATION REVISION (Left )  Patient Location: PACU  Anesthesia Type:General  Level of Consciousness: awake, alert , oriented, drowsy and patient cooperative  Airway & Oxygen Therapy: Patient Spontanous Breathing and Patient connected to face mask oxygen  Post-op Assessment: Report given to RN and Post -op Vital signs reviewed and stable  Post vital signs: Reviewed and stable  Last Vitals:  Vitals Value Taken Time  BP 154/95 09/04/19 1628  Temp    Pulse 78 09/04/19 1629  Resp 11 09/04/19 1629  SpO2 100 % 09/04/19 1629  Vitals shown include unvalidated device data.  Last Pain:  Vitals:   09/04/19 1343  TempSrc: Oral  PainSc: 9       Patients Stated Pain Goal: 2 (07/62/26 3335)  Complications: No apparent anesthesia complications

## 2019-09-05 DIAGNOSIS — M869 Osteomyelitis, unspecified: Secondary | ICD-10-CM | POA: Diagnosis present

## 2019-09-05 DIAGNOSIS — L02416 Cutaneous abscess of left lower limb: Secondary | ICD-10-CM | POA: Diagnosis present

## 2019-09-05 DIAGNOSIS — Z20822 Contact with and (suspected) exposure to covid-19: Secondary | ICD-10-CM | POA: Diagnosis present

## 2019-09-05 DIAGNOSIS — E785 Hyperlipidemia, unspecified: Secondary | ICD-10-CM | POA: Diagnosis present

## 2019-09-05 DIAGNOSIS — Z833 Family history of diabetes mellitus: Secondary | ICD-10-CM | POA: Diagnosis not present

## 2019-09-05 DIAGNOSIS — Z79899 Other long term (current) drug therapy: Secondary | ICD-10-CM | POA: Diagnosis not present

## 2019-09-05 DIAGNOSIS — E1169 Type 2 diabetes mellitus with other specified complication: Secondary | ICD-10-CM | POA: Diagnosis present

## 2019-09-05 DIAGNOSIS — F419 Anxiety disorder, unspecified: Secondary | ICD-10-CM | POA: Diagnosis present

## 2019-09-05 DIAGNOSIS — K219 Gastro-esophageal reflux disease without esophagitis: Secondary | ICD-10-CM | POA: Diagnosis present

## 2019-09-05 DIAGNOSIS — F17210 Nicotine dependence, cigarettes, uncomplicated: Secondary | ICD-10-CM | POA: Diagnosis present

## 2019-09-05 DIAGNOSIS — Y835 Amputation of limb(s) as the cause of abnormal reaction of the patient, or of later complication, without mention of misadventure at the time of the procedure: Secondary | ICD-10-CM | POA: Diagnosis present

## 2019-09-05 DIAGNOSIS — Z841 Family history of disorders of kidney and ureter: Secondary | ICD-10-CM | POA: Diagnosis not present

## 2019-09-05 DIAGNOSIS — T8781 Dehiscence of amputation stump: Secondary | ICD-10-CM | POA: Diagnosis present

## 2019-09-05 DIAGNOSIS — Z6841 Body Mass Index (BMI) 40.0 and over, adult: Secondary | ICD-10-CM | POA: Diagnosis not present

## 2019-09-05 DIAGNOSIS — Z8249 Family history of ischemic heart disease and other diseases of the circulatory system: Secondary | ICD-10-CM | POA: Diagnosis not present

## 2019-09-05 DIAGNOSIS — T8744 Infection of amputation stump, left lower extremity: Secondary | ICD-10-CM | POA: Diagnosis present

## 2019-09-05 DIAGNOSIS — I10 Essential (primary) hypertension: Secondary | ICD-10-CM | POA: Diagnosis present

## 2019-09-05 DIAGNOSIS — F329 Major depressive disorder, single episode, unspecified: Secondary | ICD-10-CM | POA: Diagnosis present

## 2019-09-05 LAB — BASIC METABOLIC PANEL
Anion gap: 9 (ref 5–15)
BUN: 37 mg/dL — ABNORMAL HIGH (ref 6–20)
CO2: 20 mmol/L — ABNORMAL LOW (ref 22–32)
Calcium: 8 mg/dL — ABNORMAL LOW (ref 8.9–10.3)
Chloride: 103 mmol/L (ref 98–111)
Creatinine, Ser: 2.47 mg/dL — ABNORMAL HIGH (ref 0.44–1.00)
GFR calc Af Amer: 27 mL/min — ABNORMAL LOW (ref >60)
GFR calc non Af Amer: 23 mL/min — ABNORMAL LOW (ref >60)
Glucose, Bld: 462 mg/dL — ABNORMAL HIGH (ref 70–99)
Potassium: 6.2 mmol/L — ABNORMAL HIGH (ref 3.5–5.1)
Sodium: 132 mmol/L — ABNORMAL LOW (ref 135–145)

## 2019-09-05 LAB — HEMOGLOBIN AND HEMATOCRIT, BLOOD
HCT: 26.1 % — ABNORMAL LOW (ref 36.0–46.0)
Hemoglobin: 8.1 g/dL — ABNORMAL LOW (ref 12.0–15.0)

## 2019-09-05 LAB — GLUCOSE, CAPILLARY
Glucose-Capillary: 406 mg/dL — ABNORMAL HIGH (ref 70–99)
Glucose-Capillary: 410 mg/dL — ABNORMAL HIGH (ref 70–99)
Glucose-Capillary: 387 mg/dL — ABNORMAL HIGH (ref 70–99)
Glucose-Capillary: 321 mg/dL — ABNORMAL HIGH (ref 70–99)
Glucose-Capillary: 374 mg/dL — ABNORMAL HIGH (ref 70–99)

## 2019-09-05 MED ORDER — METHOCARBAMOL 500 MG PO TABS
500.0000 mg | ORAL_TABLET | Freq: Four times a day (QID) | ORAL | Status: DC | PRN
  Administered 2019-09-05: 500 mg via ORAL
  Filled 2019-09-05: qty 1

## 2019-09-05 MED ORDER — INSULIN DETEMIR 100 UNIT/ML ~~LOC~~ SOLN
14.0000 [IU] | Freq: Every day | SUBCUTANEOUS | Status: DC
  Administered 2019-09-05 – 2019-09-06 (×2): 14 [IU] via SUBCUTANEOUS
  Filled 2019-09-05 (×2): qty 0.14

## 2019-09-05 MED ORDER — SODIUM ZIRCONIUM CYCLOSILICATE 10 G PO PACK
10.0000 g | PACK | Freq: Two times a day (BID) | ORAL | Status: AC
  Administered 2019-09-05 (×2): 10 g via ORAL
  Filled 2019-09-05 (×2): qty 1

## 2019-09-05 MED ORDER — CEFAZOLIN SODIUM-DEXTROSE 1-4 GM/50ML-% IV SOLN
1.0000 g | Freq: Three times a day (TID) | INTRAVENOUS | Status: DC
  Administered 2019-09-05 – 2019-09-06 (×3): 1 g via INTRAVENOUS
  Filled 2019-09-05 (×3): qty 50

## 2019-09-05 MED ORDER — INSULIN ASPART 100 UNIT/ML IV SOLN
7.0000 [IU] | Freq: Once | INTRAVENOUS | Status: AC
  Administered 2019-09-05: 7 [IU] via SUBCUTANEOUS

## 2019-09-05 MED ORDER — CYCLOBENZAPRINE HCL 10 MG PO TABS
5.0000 mg | ORAL_TABLET | Freq: Three times a day (TID) | ORAL | Status: DC | PRN
  Administered 2019-09-06: 5 mg via ORAL
  Filled 2019-09-05: qty 1

## 2019-09-05 NOTE — TOC Initial Note (Signed)
Transition of Care [facility]) - Initial/Assessment Note    Patient Details  Name: [name] [name] MRN: [id number] Date of Birth: 10/10/1975  Transition of Care [facility] CM/SW Contact:    Curlene Labrum, RN Phone Number: 09/05/2019, 3:21 PM  Clinical Narrative:                 Case management met with the patient who is S/P Left BKA revision.  Patient currently lives with her grandmother, uncle and a child at the house.  She currently receives health aide services through [facility].  Patient in the process of trying to move to new government assisted housing through [employer].  Patient states she feels safe at home but is currently in frequent verbal disagreements with the grandmother - patient is waiting on housing paperwork to be completed so she can move to [facility] soon.    Patient offered Medicare choice for home health and DME providers - Patient did not have a preference, although she used [facility] in the past.  [facility] and [facility] called and were unable to accept the patient.  Called [facility] to provide [place], PT, OT, MSW - waiting for return call.  Expected Discharge Plan: [place]     Patient Goals and CMS Choice Patient states their goals for this hospitalization and ongoing recovery are:: I'm doing well with my surgery and would like to get better and find a new place to live as well. CMS Medicare.gov Compare Post Acute Care list provided to:: Patient Choice offered to / list presented to : Patient  Expected Discharge Plan and Services Expected Discharge Plan: [place]   Discharge Planning Services: CM Consult Post Acute Care Choice: [place] arrangements for the past 2 months: Single Family Home(currently lives with grandmother, uncle and child)                 DME Arranged: 3-N-1 DME Agency: [facility] Date DME Agency Contacted: 09/05/19 Time DME Agency Contacted:  [phone] Representative spoke with at DME Agency: Thedore Mins HH Arranged: OT, PT, RN, Social Work [employer] Agency: (Called and waiting on return call from [place] at [place]) Date [place]: 09/05/19 Time [place]: 1520    Prior Living Arrangements/Services Living arrangements for the past 2 months: Single Family Home(currently lives with grandmother, uncle and child) Lives with:: Relatives Patient language and need for interpreter reviewed:: Yes Do you feel safe going back to the place where you live?: Yes      Need for Family Participation in Patient Care: Yes (Comment) Care giver support system in place?: Yes (comment) Current home services: DME, Other (comment)(aide with [facility] aide service.) Criminal Activity/Legal Involvement Pertinent to Current Situation/Hospitalization: No - Comment as needed  Activities of Daily Living Home Assistive Devices/Equipment: Wheelchair ADL Screening (condition at time of admission) Patient's cognitive ability adequate to safely complete daily activities?: Yes Is the patient deaf or have difficulty hearing?: No Does the patient have difficulty seeing, even when wearing glasses/contacts?: No Does the patient have difficulty concentrating, remembering, or making decisions?: No Patient able to express need for assistance with ADLs?: Yes Does the patient have difficulty dressing or bathing?: No Independently performs ADLs?: No Communication: Independent Dressing (OT): Needs assistance Is this a change from baseline?: Pre-admission baseline Grooming: Needs assistance Is this a change from baseline?: Pre-admission baseline Feeding: Independent Bathing: Needs assistance Is this a change from baseline?: Pre-admission baseline Toileting: Needs  assistance Is this a change from baseline?: Pre-admission baseline In/Out Bed: Needs assistance Is this a change from baseline?: Pre-admission baseline Walks in Home: Dependent Is this a change from  baseline?: Pre-admission baseline Does the patient have difficulty walking or climbing stairs?: Yes Weakness of Legs: Both Weakness of Arms/Hands: Right  Permission Sought/Granted Permission sought to share information with : Case Manager Permission granted to share information with : Yes, Verbal Permission Granted     Permission granted to share info w AGENCY: Waiting on return call from [facility]        Emotional Assessment Appearance:: Appears stated age Attitude/Demeanor/Rapport: Apprehensive Affect (typically observed): Pleasant Orientation: : Oriented to Self, Oriented to Place, Oriented to  Time, Oriented to Situation Alcohol / Substance Use: Not Applicable Psych Involvement: No (comment)  Admission diagnosis:  Wound dehiscence [T81.30XA] Patient Active Problem List   Diagnosis Date Noted  . Wound dehiscence 09/04/2019  . Right carpal tunnel syndrome 06/14/2019  . Left carpal tunnel syndrome 06/14/2019  . Tobacco use 03/01/2019  . Phantom limb pain ([place]) 11/08/2018  . Current moderate episode of major depressive disorder ([place])   . Anemia of chronic disease   . Severe protein-calorie malnutrition ([place])   . Dehiscence of amputation stump ([place])   . Status post below knee amputation, left ([place]) 04/11/2017  . Bipolar affective disorder ([place])   . Adjustment disorder with depressed mood 03/26/2017  . Heart failure with preserved ejection fraction ([place]) 02/09/2017  . Routine adult health maintenance 12/08/2016  . Chronic pain of right knee 09/08/2016  . Chronic kidney disease (CKD), stage III (moderate) 07/15/2016  . OSA (obstructive sleep apnea) 05/13/2016  . Morbid obesity due to excess calories ([place]) 11/27/2015  . Hypothyroidism 04/25/2013  . Controlled type 2 diabetes mellitus ([place]) 07/12/2012  . Carpal tunnel syndrome, bilateral 01/25/2012  . Diabetic peripheral neuropathy ([place]) 07/04/2011  . Anxiety and depression 06/02/2011  . GERD 12/06/2007  .  Hyperlipidemia 08/29/2007  . Hypertension associated with diabetes ([place]) 08/29/2007   PCP:  Ina Homes, MD Pharmacy:   [facility] [place], [place] - [address] [place] [address] Phone: [phone] Fax: [phone]     Social Determinants of Health (SDOH) Interventions    Readmission Risk Interventions No flowsheet data found.

## 2019-09-05 NOTE — Plan of Care (Signed)
  Problem: Education: Goal: Knowledge of General Education information will improve Description: Including pain rating scale, medication(s)/side effects and non-pharmacologic comfort measures 09/05/2019 0149 by Josepha Pigg, RN Outcome: Progressing 09/04/2019 2025 by Josepha Pigg, RN Outcome: Progressing   Problem: Health Behavior/Discharge Planning: Goal: Ability to manage health-related needs will improve 09/05/2019 0149 by Josepha Pigg, RN Outcome: Progressing 09/04/2019 2025 by Josepha Pigg, RN Outcome: Progressing   Problem: Clinical Measurements: Goal: Ability to maintain clinical measurements within normal limits will improve 09/05/2019 0149 by Josepha Pigg, RN Outcome: Progressing 09/04/2019 2025 by Josepha Pigg, RN Outcome: Progressing Goal: Will remain free from infection 09/05/2019 0149 by Josepha Pigg, RN Outcome: Progressing 09/04/2019 2025 by Josepha Pigg, RN Outcome: Progressing Goal: Diagnostic test results will improve 09/05/2019 0149 by Josepha Pigg, RN Outcome: Progressing 09/04/2019 2025 by Josepha Pigg, RN Outcome: Progressing Goal: Respiratory complications will improve 09/05/2019 0149 by Josepha Pigg, RN Outcome: Progressing 09/04/2019 2025 by Josepha Pigg, RN Outcome: Progressing Goal: Cardiovascular complication will be avoided 09/05/2019 0149 by Josepha Pigg, RN Outcome: Progressing 09/04/2019 2025 by Josepha Pigg, RN Outcome: Progressing   Problem: Activity: Goal: Risk for activity intolerance will decrease 09/05/2019 0149 by Josepha Pigg, RN Outcome: Progressing 09/04/2019 2025 by Josepha Pigg, RN Outcome: Progressing   Problem: Nutrition: Goal: Adequate nutrition will be maintained 09/05/2019 0149 by Josepha Pigg, RN Outcome: Progressing 09/04/2019 2025 by Josepha Pigg, RN Outcome: Progressing   Problem: Coping: Goal: Level of anxiety will  decrease 09/05/2019 0149 by Josepha Pigg, RN Outcome: Progressing 09/04/2019 2025 by Josepha Pigg, RN Outcome: Progressing   Problem: Elimination: Goal: Will not experience complications related to bowel motility 09/05/2019 0149 by Josepha Pigg, RN Outcome: Progressing 09/04/2019 2025 by Josepha Pigg, RN Outcome: Progressing Goal: Will not experience complications related to urinary retention 09/05/2019 0149 by Josepha Pigg, RN Outcome: Progressing 09/04/2019 2025 by Josepha Pigg, RN Outcome: Progressing   Problem: Pain Managment: Goal: General experience of comfort will improve 09/05/2019 0149 by Josepha Pigg, RN Outcome: Progressing 09/04/2019 2025 by Josepha Pigg, RN Outcome: Progressing   Problem: Safety: Goal: Ability to remain free from injury will improve 09/05/2019 0149 by Josepha Pigg, RN Outcome: Progressing 09/04/2019 2025 by Josepha Pigg, RN Outcome: Progressing   Problem: Skin Integrity: Goal: Risk for impaired skin integrity will decrease 09/05/2019 0149 by Josepha Pigg, RN Outcome: Progressing 09/04/2019 2025 by Josepha Pigg, RN Outcome: Progressing

## 2019-09-05 NOTE — Progress Notes (Signed)
Notified that patient was in the hospital for a BKA revision. I went by for a social visit. She is doing well but a little upset with her diabetes and cramping.   She has worked really hard as an outpatient changing her diet and working on weight loss. With these lifestyle modifications she has been able to come off insulin. She is upset because she is back on insulin. We discussed that the hospital formulary does not have Victoza and that we typically put patients on insulin when they are in the hospital. She voices understanding.   She also continues to have cramps in her arms, hands, and legs. Can last over an 1 hour. She has tried Robaxin as an outpatient without any relief. We have had better success using cyclobenzaprine.   Ina Homes, MD  IM[facility] PGY3  Cell [phone]

## 2019-09-05 NOTE — Progress Notes (Addendum)
Paged on call MD for night time coverage of cbg [phone]. Awaiting call back.    Received new order to give Novolog 7 units for night time coverage.

## 2019-09-05 NOTE — Evaluation (Signed)
Physical Therapy Evaluation Patient Details Name: [name] [name] MRN: [id number] DOB: [birth year] Today's Date: 09/05/2019   History of Present Illness  44 yo female s/p L BKA revision with placement of wound vac on 09/04/19. PMH includes L BKA 2018 with multiple I&Ds and 1 prior revision, LBP, bipolar disorder, fibromyalgia, peripheral neuropathy, HTN, HLD, OSA, obesity.  Clinical Impression   Pt presents with mild to no residual limb pain, decreased safety awareness s/p BKA revision, increased time and effort to mobilize, and decreased activity tolerance vs baseline. Pt to benefit from acute PT to address deficits. Pt required close guard for safety during transfers, and use of RW for support as pt attempted transfer without use of AD which PT explained was unsafe. Pt explained stair navigation process to PT, and it included lowering onto L residual limb which PT discouraged, will need to practice bump up method for stairs tomorrow as her grandmother's home has 2 steps to navigate to enter. Pt with no equipment needs on d/c, recommending HHPT to address deficits. PT to progress mobility as tolerated, and will continue to follow acutely.      Follow Up Recommendations Home health PT;Supervision for mobility/OOB    Equipment Recommendations  None recommended by PT    Recommendations for Other Services       Precautions / Restrictions Precautions Precautions: Fall Precaution Comments: wound vac Restrictions Weight Bearing Restrictions: Yes LLE Weight Bearing: Non weight bearing Other Position/Activity Restrictions: BKA      Mobility  Bed Mobility Overal bed mobility: Needs Assistance             General bed mobility comments: pt up in chair upon PT arrival to room and requests stay in chair upon PT exit.  Transfers Overall transfer level: Needs assistance Equipment used: Rolling walker (2 wheeled);None Transfers: Sit to/from [employer] to Stand:  Min guard Stand pivot transfers: Min guard       General transfer comment: sit to stand x2, from recliner and EOB. Pt requested to attempt transfer without AD, but pt heavily reliant on recliner armrests and bed, PT required use of RW for steadiness. Min guard for stand and pivot recliner<>bed, with increased time to perform.  Ambulation/Gait Ambulation/Gait assistance: Min guard   Assistive device: Rolling walker (2 wheeled)       General Gait Details: Pt able to hop x3 with RLE and min guard for steadying, did not progress to full gait training as pt will mostly be doing transfers in and out of w/c at home.  Stairs            Wheelchair Mobility    Modified Rankin (Stroke Patients Only)       Balance Overall balance assessment: Needs assistance Sitting-balance support: No upper extremity supported;Feet supported Sitting balance-Leahy Scale: Good     Standing balance support: Bilateral upper extremity supported;During functional activity Standing balance-Leahy Scale: Poor Standing balance comment: reliant on external support                             Pertinent Vitals/Pain Pain Assessment: No/denies pain    Home Living Family/patient expects to be discharged to:: Private residence Living Arrangements: Other relatives Available Help at Discharge: Family;Available PRN/intermittently(lives at grandmother's house, uncle and goddaughter age 49 also live there) Type of Home: House Home Access: Stairs to enter   [employer] of Steps: back porch - 2 Home Layout: One level Home Equipment: Wheelchair -  manual;Walker - 2 wheels;Cane - single point      Prior Function Level of Independence: Independent with assistive device(s);Needs assistance   Gait / Transfers Assistance Needed: Pt reports typically walking with prosthetic with no AD PTA. Initially (a few weeks ago), pt and Dr. Sharol Given thought her infection on her residual limb was fungal, but on  Sunday 4/11 pt exhibiting more s/s of residual limb infection. Pt in wheelchair Sunday 4/11 to Tuesday 4/13 until she saw Dr. Sharol Given on 4/13, plan for surgery next day.  ADL's / Homemaking Assistance Needed: personal care assistant for 2 hours a day, every day, to help with cooking, cleaning, dressing, bathing. Pt states "I can't rely on anyone to help me at home, it's not a great living situation (for support)"        Hand Dominance   Dominant Hand: Right    Extremity/Trunk Assessment   Upper Extremity Assessment Upper Extremity Assessment: Overall WFL for tasks assessed    Lower Extremity Assessment Lower Extremity Assessment: Overall WFL for tasks assessed;LLE deficits/detail LLE Deficits / Details: s/p BKA revision, able to perform good strength hip extension, hip flexion, hip abd/add    Cervical / Trunk Assessment Cervical / Trunk Assessment: Normal  Communication   Communication: No difficulties  Cognition Arousal/Alertness: Awake/alert Behavior During Therapy: WFL for tasks assessed/performed Overall Cognitive Status: Within Functional Limits for tasks assessed                                 General Comments: Pt very pleasant, cooperative. At times needs cuing for safest way to mobilize.      General Comments      Exercises Amputee Exercises Hip Extension: AROM;Left;5 reps;Seated(with tactile cuing of PT hand under L residual limb; WB not through incisional area)   Assessment/Plan    PT Assessment Patient needs continued PT services  PT Problem List Decreased strength;Decreased mobility;Decreased range of motion;Decreased balance;Decreased activity tolerance;Decreased knowledge of use of DME;Pain;Decreased knowledge of precautions;Decreased safety awareness       PT Treatment Interventions Therapeutic activities;DME instruction;Gait training;Therapeutic exercise;Patient/family education;Balance training;Stair training;Functional mobility training     PT Goals (Current goals can be found in the Care Plan section)  Acute Rehab PT Goals Patient Stated Goal: go home PT Goal Formulation: With patient Time For Goal Achievement: 09/19/19 Potential to Achieve Goals: Good    Frequency Min 3X/week   Barriers to discharge        Co-evaluation               AM-PAC PT "6 Clicks" Mobility  Outcome Measure Help needed turning from your back to your side while in a flat bed without using bedrails?: None Help needed moving from lying on your back to sitting on the side of a flat bed without using bedrails?: None Help needed moving to and from a bed to a chair (including a wheelchair)?: A Little Help needed standing up from a chair using your arms (e.g., wheelchair or bedside chair)?: A Little Help needed to walk in hospital room?: A Little Help needed climbing 3-5 steps with a railing? : A Lot 6 Click Score: 19    End of Session   Activity Tolerance: Patient tolerated treatment well Patient left: in chair;with chair alarm set;with call bell/phone within reach Nurse Communication: Mobility status PT Visit Diagnosis: Other abnormalities of gait and mobility (R26.89);Difficulty in walking, not elsewhere classified (R26.2)    Time: 3500-9381  PT Time Calculation (min) (ACUTE ONLY): 24 min   Charges:   PT Evaluation $PT Eval Low Complexity: 1 Low PT Treatments $Therapeutic Activity: 8-22 mins        Yoel Kaufhold E, PT Acute Rehabilitation Services Pager [phone]  Office [phone]   Dayana Dalporto D Elonda Husky 09/05/2019, 4:26 PM

## 2019-09-05 NOTE — Progress Notes (Signed)
Pt with continued request for snacks and diet sodas. Educated patient for better snack choices d/t history of DMll. Noted elevated CBG this am, day shift nurse updated.  Pt refused to have SCD replaced on RLE. Also, she refused the continous pox sat Educated patient and she said "later" this morning.   IVF's infusing without difficulty. Wound Vac intact and  Bed exit alarm maintained.

## 2019-09-05 NOTE — Plan of Care (Signed)
  Problem: Education: Goal: Knowledge of General Education information will improve Description Including pain rating scale, medication(s)/side effects and non-pharmacologic comfort measures Outcome: Progressing   

## 2019-09-05 NOTE — Progress Notes (Signed)
Inpatient Diabetes Program Recommendations  AACE/ADA: New Consensus Statement on Inpatient Glycemic Control (2015)  Target Ranges:  Prepandial:   less than 140 mg/dL      Peak postprandial:   less than 180 mg/dL (1-2 hours)      Critically ill patients:  140 - 180 mg/dL   Lab Results  Component Value Date   GLUCAP 410 (H) 09/05/2019   HGBA1C 8.5 (H) 09/04/2019    Review of Glycemic Control Results for [name] [name], [name] [name] (MRN [id number]) as of 09/05/2019 09:34  Ref. Range 09/04/2019 19:27 09/04/2019 20:15 09/05/2019 06:40 09/05/2019 09:22  Glucose-Capillary Latest Ref Range: 70 - 99 mg/dL 263 (H) 267 (H) 406 (H) 410 (H)   Diabetes history: Type 2 DM Outpatient Diabetes medications: Victoza 1.8 mg QD Current orders for Inpatient glycemic control: Novolog 0-15 units TID Decadron 5 mg x 1  Inpatient Diabetes Program Recommendations:    Question validity of A1C of 8.5% given hemoglobin low at 8.4, may be inaccurate.  Glucose trends elevated this AM, assuming related to steroids.   Consider adding Levemir 14 units QD.   Thanks, Bronson Curb, MSN, RNC-OB Diabetes Coordinator [phone] (8a-5p)

## 2019-09-05 NOTE — Progress Notes (Addendum)
POD1 BKA revision. Patient has history of cramping running from lateral thigh to foot. Cramping has returned. Has taken muscle relaxants in the past. Also concerned the  bloood sugar is 406  VSS afebrile 350 in vac. Light pink.  Right Lower Extremity. No tenderness in calf. Negative homans sign  CX have grown gram positive cocci. Will discuss with Dr. Sharol Given . Continue Kefzol for now  Will start Robaxin. Patient normally takes Victoza. No substitute available per pharmacy. Will consult diabetes educator for control while patient is in house. Will hopefully discharge tonmorrow when drainage has decreased

## 2019-09-06 ENCOUNTER — Telehealth: Payer: Self-pay | Admitting: *Deleted

## 2019-09-06 ENCOUNTER — Other Ambulatory Visit: Payer: Self-pay | Admitting: Internal Medicine

## 2019-09-06 DIAGNOSIS — B373 Candidiasis of vulva and vagina: Secondary | ICD-10-CM

## 2019-09-06 DIAGNOSIS — B3731 Acute candidiasis of vulva and vagina: Secondary | ICD-10-CM

## 2019-09-06 LAB — GLUCOSE, CAPILLARY
Glucose-Capillary: 193 mg/dL — ABNORMAL HIGH (ref 70–99)
Glucose-Capillary: 245 mg/dL — ABNORMAL HIGH (ref 70–99)

## 2019-09-06 LAB — POTASSIUM: Potassium: 5.1 mmol/L (ref 3.5–5.1)

## 2019-09-06 MED ORDER — ASPIRIN 325 MG PO TBEC: 325.0000 mg | DELAYED_RELEASE_TABLET | Freq: Every day | ORAL | 0 refills | Status: DC

## 2019-09-06 MED ORDER — CYCLOBENZAPRINE HCL 5 MG PO TABS: 5.0000 mg | ORAL_TABLET | Freq: Every day | ORAL | 0 refills | Status: DC | PRN

## 2019-09-06 MED ORDER — AMOXICILLIN-POT CLAVULANATE 875-125 MG PO TABS: 1.0000 | ORAL_TABLET | Freq: Two times a day (BID) | ORAL | 0 refills | Status: AC

## 2019-09-06 MED ORDER — OXYCODONE HCL 5 MG PO TABS: 5.0000 mg | ORAL_TABLET | ORAL | 0 refills | Status: DC | PRN

## 2019-09-06 NOTE — Progress Notes (Addendum)
Patient is postop day #2 status post irrigation and debridement of her below-knee amputation stump.  She is sitting up in bed ordering breakfast this morning and appears comfortable.  She is only had about 20 cc of drainage in her canister of her wound VAC.  Vital signs stable afebrile potassium is 5.1.  Cultures are pansensitive.  Discussed patient with Dr. Sharol Given she will go home on a 2-week course of Augmentin.  She was also asking about follow-up regarding her potassium and her other medications and I have told her that she will need to follow-up with her primary care provider within a week.

## 2019-09-06 NOTE — Progress Notes (Signed)
Patient discharging home. Discharge instructions explained to patient and she verbalized understanding. Wound vac switched to prevena vac  and patient given an extra canister and showed how to change it. Wound vac with 25ml drainage prior to being switched. Patient packed all her personal belongings. No further questions or concerns voiced.

## 2019-09-06 NOTE — Telephone Encounter (Signed)
Patient having issues with muscle cramps. Previously on cyclobenzaprine. Will use temporarily for symptom relief while we work up her cramps.   Ina Homes, MD

## 2019-09-06 NOTE — Discharge Summary (Signed)
Discharge Diagnoses:  Active Problems:   Dehiscence of amputation stump (HCC)   Wound dehiscence   Surgeries: Procedure(s): LEFT BELOW KNEE AMPUTATION REVISION on 09/04/2019    Consultants:   Discharged Condition: Improved  Hospital Course: [name] [name] is an 44 y.o. female who was admitted 09/04/2019 with a chief complaint of Wound dehiscence Left Below Knee Amputation Stump, with a final diagnosis of abscess osteomyelitis left below knee amputation.  Patient was brought to the operating room on 09/04/2019 and underwent Procedure(s): LEFT BELOW KNEE AMPUTATION REVISION.    Patient was given perioperative antibiotics:  Anti-infectives (From admission, onward)   Start     Dose/Rate Route Frequency Ordered Stop   09/06/19 0000  amoxicillin-clavulanate (AUGMENTIN) 875-125 MG tablet     1 tablet Oral 2 times daily 09/06/19 0936 09/13/19 2359   09/05/19 1300  ceFAZolin (ANCEF) IVPB 1 g/50 mL premix     1 g 100 mL/hr over 30 Minutes Intravenous Every 8 hours 09/05/19 0743 09/07/19 1259   09/05/19 0600  ceFAZolin (ANCEF) 3 g in dextrose 5 % 50 mL IVPB     3 g 100 mL/hr over 30 Minutes Intravenous On call to O.R. 09/04/19 1321 09/05/19 0550   09/04/19 2300  ceFAZolin (ANCEF) IVPB 2g/100 mL premix  Status:  Discontinued     2 g 200 mL/hr over 30 Minutes Intravenous Every 6 hours 09/04/19 2036 09/05/19 0742    .  Patient was given sequential compression devices, early ambulation, and aspirin for DVT prophylaxis.  Recent vital signs:  Patient Vitals for the past 24 hrs:  BP Temp Temp src Pulse Resp SpO2  09/06/19 0840 112/75 97.9 F (36.6 C) Oral 80 -- 100 %  09/06/19 0304 121/70 -- -- 79 16 100 %  09/05/19 2024 121/71 97.7 F (36.5 C) Oral 88 16 100 %  09/05/19 1539 106/67 (!) 97.5 F (36.4 C) Oral 76 17 100 %  .  Recent laboratory studies: No results found.  Discharge Medications:     Diagnostic Studies: DG Chest 2 View  Result Date: 09/01/2019 CLINICAL DATA:  Wound  infection EXAM: CHEST - 2 VIEW COMPARISON:  02/13/2019 FINDINGS: Lungs are clear.  No pleural effusion or pneumothorax. The heart is normal in size. Degenerative changes of the visualized thoracolumbar spine. IMPRESSION: Normal chest radiographs. Electronically Signed   By: Julian Hy M.D.   On: 09/01/2019 22:09    Patient benefited maximally from their hospital stay and there were no complications.     Disposition: Discharge disposition: 01-Home or Self Care      Discharge Instructions    Call MD / Call 911   Complete by: As directed    If you experience chest pain or shortness of breath, CALL 911 and be transported to the hospital emergency room.  If you develope a fever above 101 F, pus (white drainage) or increased drainage or redness at the wound, or calf pain, call your surgeon's office.   Constipation Prevention   Complete by: As directed    Drink plenty of fluids.  Prune juice may be helpful.  You may use a stool softener, such as Colace (over the counter) 100 mg twice a day.  Use MiraLax (over the counter) for constipation as needed.   Diet - low sodium heart healthy   Complete by: As directed    Discharge instructions   Complete by: As directed    Keep dressing dry and in place.  Please follow-up with your primary care physician  within this week for increased potassium and better glucose control.  Follow-up with Dr. Sharol Given in 1 week   Increase activity slowly as tolerated   Complete by: As directed    Negative Pressure Wound Therapy - Incisional   Complete by: As directed    Show patient how to attach [place]    Newt Minion, MD In 1 week.   Specialty: Orthopedic Surgery Contact information: [place] [address] [phone]        Ina Homes, MD Follow up in 1 week(s).   Specialty: Internal Medicine Why: for follow up on glucose control and elevated potassium Contact information: [place] [address] [phone]            Signed: Bevely Palmer Miah Boye 09/06/2019, 9:38 AM

## 2019-09-06 NOTE — Progress Notes (Signed)
Physical Therapy Treatment Patient Details Name: [name] [name] MRN: [id number] DOB: 1976-01-15 Today's Date: 09/06/2019    History of Present Illness 44 yo female s/p L BKA revision with placement of wound vac on 09/04/19. PMH includes L BKA 2018 with multiple I&Ds and 1 prior revision, LBP, bipolar disorder, fibromyalgia, peripheral neuropathy, HTN, HLD, OSA, obesity.   PT Comments    Pt preparing for d/c home this afternoon. Today's session focused on transfer and stair training as pt has 2 steps to enter grandmother's home where she is staying. Attempted various techniques on practice steps with rail and w/c support; pt reports she feels more comfortable discharging now that we've talked and worked through these scenarios. Reviewed precautions and portable wound vac use. Recommend follow-up with HHPT services to maximize functional mobility and independence. Pt ready for d/c.    Follow Up Recommendations  Home health PT;Supervision for mobility/OOB     Equipment Recommendations  None recommended by PT    Recommendations for Other Services       Precautions / Restrictions Precautions Precautions: Fall;Other (comment) Precaution Comments: LLE wound vac Restrictions Weight Bearing Restrictions: Yes LLE Weight Bearing: Non weight bearing Other Position/Activity Restrictions: BKA    Mobility  Bed Mobility Overal bed mobility: Independent             General bed mobility comments: seated EOB  Transfers Overall transfer level: Modified independent Equipment used: Rolling walker (2 wheeled);None Transfers: Sit to/from [employer] to Stand: Modified independent (Device/Increase time) Stand pivot transfers: Modified independent (Device/Increase time)       General transfer comment: Able to stand multiple times from w/c and EOB mod indep with RW; mod indep stand pivot transfer from w/c to bed with UE bed rail  support  Ambulation/Gait Ambulation/Gait assistance: Supervision Gait Distance (Feet): 2 Feet Assistive device: Rolling walker (2 wheeled)       General Gait Details: Able to hop on RLE with RW at supervision-level   Stairs Stairs: Yes Stairs assistance: Supervision Stair Management: Seated/boosting;Two rails Number of Stairs: 2 General stair comments: Increased time problem solving through best way to ascend 2 steps into home with bilat rail support. Pt practiced pivoting from w/c to sitting on bottom step, bumping up, then pulling self up with bilateral rails to stand; increased time and effort, performed 2x trials.   Wheelchair Mobility    Modified Rankin (Stroke Patients Only)       Balance Overall balance assessment: Needs assistance Sitting-balance support: No upper extremity supported;Feet supported Sitting balance-Leahy Scale: Good       Standing balance-Leahy Scale: Poor Standing balance comment: Reliant on UE support                            Cognition Arousal/Alertness: Awake/alert Behavior During Therapy: WFL for tasks assessed/performed Overall Cognitive Status: Within Functional Limits for tasks assessed                                        Exercises      General Comments General comments (skin integrity, edema, etc.): Educ on portable wound vac      Pertinent Vitals/Pain Pain Assessment: No/denies pain    Home Living                      Prior Function  PT Goals (current goals can now be found in the care plan section) Progress towards PT goals: Progressing toward goals    Frequency    Min 3X/week      PT Plan Current plan remains appropriate    Co-evaluation              AM-PAC PT "6 Clicks" Mobility   Outcome Measure  Help needed turning from your back to your side while in a flat bed without using bedrails?: None Help needed moving from lying on your back to sitting  on the side of a flat bed without using bedrails?: None Help needed moving to and from a bed to a chair (including a wheelchair)?: None Help needed standing up from a chair using your arms (e.g., wheelchair or bedside chair)?: None Help needed to walk in hospital room?: A Little Help needed climbing 3-5 steps with a railing? : A Lot 6 Click Score: 21    End of Session   Activity Tolerance: Patient tolerated treatment well Patient left: with call bell/phone within reach(seated EOB) Nurse Communication: Mobility status PT Visit Diagnosis: Other abnormalities of gait and mobility (R26.89);Difficulty in walking, not elsewhere classified (R26.2)     Time: 0998-3382 PT Time Calculation (min) (ACUTE ONLY): 40 min  Charges:  $Gait Training: 8-22 mins $Therapeutic Activity: 8-22 mins $Self Care/Home Management: [place], PT, DPT Acute Rehabilitation Services  Pager [phone] Office [phone]  Derry Lory 09/06/2019, 12:25 PM

## 2019-09-06 NOTE — Telephone Encounter (Signed)
[facility] called and requests a refill of flexeril, states pt called and stated she will be disch from hospital today and wanted it delivered. I can not see the medication list, only hospital meds. Please advise

## 2019-09-06 NOTE — Progress Notes (Signed)
Internal Medicine Clinic Attending  Case discussed with Dr. Jones at the time of the visit.  We reviewed the resident's history and exam and pertinent patient test results.  I agree with the assessment, diagnosis, and plan of care documented in the resident's note.  

## 2019-09-09 ENCOUNTER — Ambulatory Visit: Payer: Self-pay | Admitting: *Deleted

## 2019-09-09 ENCOUNTER — Encounter: Payer: Self-pay | Admitting: *Deleted

## 2019-09-09 ENCOUNTER — Other Ambulatory Visit: Payer: Self-pay | Admitting: *Deleted

## 2019-09-09 LAB — AEROBIC/ANAEROBIC CULTURE W GRAM STAIN (SURGICAL/DEEP WOUND)

## 2019-09-09 NOTE — Chronic Care Management (AMB) (Signed)
  Chronic Care Management   Note  09/09/2019 Name: [name] [name] MRN: [id number] DOB: [birth year]   Attempted to reach patient for [facility] General Discharge red flag follow up.  Follow up plan: A HIPPA compliant phone message was left for the patient providing contact information and requesting a return call.   Kelli Churn RN, CCM, [facility] RN Care Manager [phone]

## 2019-09-10 ENCOUNTER — Ambulatory Visit: Payer: Self-pay | Admitting: *Deleted

## 2019-09-10 ENCOUNTER — Other Ambulatory Visit: Payer: Self-pay | Admitting: *Deleted

## 2019-09-10 ENCOUNTER — Telehealth: Payer: Self-pay

## 2019-09-10 DIAGNOSIS — E1159 Type 2 diabetes mellitus with other circulatory complications: Secondary | ICD-10-CM | POA: Diagnosis not present

## 2019-09-10 DIAGNOSIS — E1122 Type 2 diabetes mellitus with diabetic chronic kidney disease: Secondary | ICD-10-CM | POA: Diagnosis not present

## 2019-09-10 DIAGNOSIS — E1142 Type 2 diabetes mellitus with diabetic polyneuropathy: Secondary | ICD-10-CM | POA: Diagnosis not present

## 2019-09-10 DIAGNOSIS — Z4781 Encounter for orthopedic aftercare following surgical amputation: Secondary | ICD-10-CM | POA: Diagnosis not present

## 2019-09-10 DIAGNOSIS — Z4801 Encounter for change or removal of surgical wound dressing: Secondary | ICD-10-CM | POA: Diagnosis not present

## 2019-09-10 DIAGNOSIS — N183 Chronic kidney disease, stage 3 unspecified: Secondary | ICD-10-CM | POA: Diagnosis not present

## 2019-09-10 DIAGNOSIS — M545 Low back pain: Secondary | ICD-10-CM | POA: Diagnosis not present

## 2019-09-10 DIAGNOSIS — D649 Anemia, unspecified: Secondary | ICD-10-CM | POA: Diagnosis not present

## 2019-09-10 DIAGNOSIS — E785 Hyperlipidemia, unspecified: Secondary | ICD-10-CM

## 2019-09-10 DIAGNOSIS — I150 Renovascular hypertension: Secondary | ICD-10-CM | POA: Diagnosis not present

## 2019-09-10 MED ORDER — AMITRIPTYLINE HCL 50 MG PO TABS: 50.0000 mg | ORAL_TABLET | Freq: Every day | ORAL | 1 refills | Status: DC

## 2019-09-10 NOTE — Chronic Care Management (AMB) (Signed)
  Chronic Care Management   Note  09/10/2019 Name: [name] [name] MRN: [id number] DOB: May 20, 1976   Second unsuccessful outreach to patient to follow up on [place] Discharge red flag issues. See image below.  HIPAA compliant message left requesting return call.    Follow up plan: The care management team will reach out to the patient again over the next 7 days.   Kelli Churn RN, CCM, [facility] RN Care Manager [phone]

## 2019-09-10 NOTE — Telephone Encounter (Signed)
Called and sw HHN pt is s/p a revision BKA 09/04/19 pt turned the vac off yesterday. Advised that she can remove the vac today, apply a dry dressing and follow up with [place] in the office as sch on thursday. If there are any questions or concerns to call and let us know.

## 2019-09-10 NOTE — Telephone Encounter (Signed)
[facility] nurse called and stated pt's wound vac is full with no replacement and wants to know what Dr. Sharol Given wants her to do.   Please call her back @ [phone]

## 2019-09-11 MED ORDER — ATORVASTATIN CALCIUM 20 MG PO TABS: 20.0000 mg | ORAL_TABLET | Freq: Every day | ORAL | 1 refills | Status: DC

## 2019-09-12 ENCOUNTER — Ambulatory Visit (INDEPENDENT_AMBULATORY_CARE_PROVIDER_SITE_OTHER): Payer: Medicare HMO | Admitting: Physician Assistant

## 2019-09-12 ENCOUNTER — Encounter: Payer: Self-pay | Admitting: Anesthesiology

## 2019-09-12 ENCOUNTER — Telehealth: Payer: Self-pay | Admitting: Orthopedic Surgery

## 2019-09-12 ENCOUNTER — Other Ambulatory Visit: Payer: Self-pay

## 2019-09-12 VITALS — Ht 69.0 in | Wt 289.0 lb

## 2019-09-12 DIAGNOSIS — Z89512 Acquired absence of left leg below knee: Secondary | ICD-10-CM

## 2019-09-12 NOTE — Addendum Note (Signed)
Addendum  created 09/12/19 0804 by Myrtie Soman, MD   Intraprocedure Event edited, Intraprocedure Staff edited

## 2019-09-12 NOTE — Telephone Encounter (Signed)
Received call from [place] with [facility] needing [facility] wound care orders for patient's left stump. The number to contact Levada Dy is [phone]     A message can be left on her secure voicemail per Levada Dy

## 2019-09-12 NOTE — Telephone Encounter (Signed)
Called and sw [facility] to advise that we saw her in the office today and have applied a 4 layer compression wrap to the limb up to mid thigh with gauze covering the incision. If they will change the dressing on Monday we will follow up with the pt on Thursday and update orders at that time.

## 2019-09-12 NOTE — Progress Notes (Signed)
Office Visit Note   Patient: [name] [name]           Date of Birth: 25-Dec-1975           MRN: [id number] Visit Date: 09/12/2019              Requested by: Ina Homes, MD [address] [place],  [place] [address] PCP: Ina Homes, MD  Chief Complaint  Patient presents with  . Left Leg - Routine Post Op    09/04/19 left BKA       HPI: Patient presents today 1 week status post left below-knee amputation revision.  She is currently sitting in her wheelchair without a support for her left amputation stump.  While she has a massive amount of swelling she states that it is better than it was earlier this week  Assessment & Plan: Visit Diagnoses: No diagnosis found.  Plan: We will apply a compression wrap.  Hopefully we can give her an order for some kind of transfer board or elevation of the leg rest for her wheelchair as this is imperative for her leg elevation.  Follow-up in 1 week  Follow-Up Instructions: No follow-ups on file.   Ortho Exam  Patient is alert, oriented, no adenopathy, well-dressed, normal affect, normal respiratory effort. Massive amount of soft tissue swelling below-knee amputation stump.  Very minimal wound dehiscence however she has healthy wound edge edges.  Sutures are intact no surrounding cellulitis or fluctuance no necrosis  Imaging: No results found. No images are attached to the encounter.  Labs: Lab Results  Component Value Date   HGBA1C 8.5 (H) 09/04/2019   HGBA1C 7.0 (A) 05/30/2019   HGBA1C 6.6 (A) 02/28/2019   ESRSEDRATE 130 (H) 03/24/2017   ESRSEDRATE 53 (H) 09/14/2015   ESRSEDRATE 132 (H) 07/06/2012   CRP 13.6 (H) 02/14/2019   CRP 38.6 (H) 04/03/2018   CRP 5.4 (H) 09/14/2015   LABURIC 8.3 (H) 04/15/2019   REPTSTATUS 09/09/2019 FINAL 09/04/2019   GRAMSTAIN  09/04/2019    FEW WBC PRESENT, PREDOMINANTLY PMN MODERATE GRAM POSITIVE COCCI    CULT  09/04/2019    ABUNDANT STREPTOCOCCUS GROUP G Beta hemolytic streptococci are  predictably susceptible to penicillin and other beta lactams. Susceptibility testing not routinely performed. FEW STAPHYLOCOCCUS AUREUS NO ANAEROBES ISOLATED Performed at [facility], [place] [address], [place], [place] [address]    LABORGA STAPHYLOCOCCUS AUREUS 09/04/2019     Lab Results  Component Value Date   ALBUMIN 1.8 (L) 09/01/2019   ALBUMIN 2.1 (L) 02/12/2019   ALBUMIN 2.8 (L) 12/18/2018   LABURIC 8.3 (H) 04/15/2019    Lab Results  Component Value Date   MG 2.3 04/07/2018   MG 2.1 04/05/2018   MG 2.4 03/25/2017   No results found for: VD25OH  No results found for: PREALBUMIN CBC EXTENDED Latest Ref Rng & Units 09/05/2019 09/01/2019 04/12/2019  WBC 4.0 - 10.5 K/uL - 6.9 5.6  RBC 3.87 - 5.11 MIL/uL - 3.20(L) 3.21(L)  HGB 12.0 - 15.0 g/dL 8.1(L) 8.4(L) 8.4(L)  HCT 36.0 - 46.0 % 26.1(L) 27.7(L) 26.5(L)  PLT 150 - 400 K/uL - 270 259  NEUTROABS 1.7 - 7.7 K/uL - 4.9 -  LYMPHSABS 0.7 - 4.0 K/uL - 1.0 -     Body mass index is 42.68 kg/m.  Orders:  No orders of the defined types were placed in this encounter.  No orders of the defined types were placed in this encounter.    Procedures: No procedures  performed  Clinical Data: No additional findings.  ROS:  All other systems negative, except as noted in the HPI. Review of Systems  Objective: Vital Signs: Ht 5\' 9"  (1.753 m)   Wt 289 lb (131.1 kg)   BMI 42.68 kg/m   Specialty Comments:  No specialty comments available.  PMFS History: Patient Active Problem List   Diagnosis Date Noted  . Wound dehiscence 09/04/2019  . Right carpal tunnel syndrome 06/14/2019  . Left carpal tunnel syndrome 06/14/2019  . Tobacco use 03/01/2019  . Phantom limb pain ([place]) 11/08/2018  . Current moderate episode of major depressive disorder ([place])   . Anemia of chronic disease   . Severe protein-calorie malnutrition ([place])   . Dehiscence of amputation stump ([place])   . Status post below knee amputation, left ([place])  04/11/2017  . Bipolar affective disorder ([place])   . Adjustment disorder with depressed mood 03/26/2017  . Heart failure with preserved ejection fraction ([place]) 02/09/2017  . Routine adult health maintenance 12/08/2016  . Chronic pain of right knee 09/08/2016  . Chronic kidney disease (CKD), stage III (moderate) 07/15/2016  . OSA (obstructive sleep apnea) 05/13/2016  . Morbid obesity due to excess calories ([place]) 11/27/2015  . Hypothyroidism 04/25/2013  . Controlled type 2 diabetes mellitus ([place]) 07/12/2012  . Carpal tunnel syndrome, bilateral 01/25/2012  . Diabetic peripheral neuropathy ([place]) 07/04/2011  . Anxiety and depression 06/02/2011  . GERD 12/06/2007  . Hyperlipidemia 08/29/2007  . Hypertension associated with diabetes ([place]) 08/29/2007   Past Medical History:  Diagnosis Date  . Abdominal muscle pain 09/08/2016  . Abnormal Pap smear of cervix 2009  . Abscess    history of multiple abscesses  . Acute bilateral low back pain 02/13/2017  . Acute blood loss anemia   . Acute renal failure ([place]) 07/12/2012  . AKI (acute kidney injury) ([place])   . Anemia of chronic disease 2002  . Anxiety    Panic attacks  . Bilateral lower extremity edema 05/13/2016  . Bipolar disorder ([place])   . Cellulitis 05/21/2014   right eye  . Chronic bronchitis ([place])    "get it q yr" (05/13/2013)  . Chronic pain   . Depression   . Edema of lower extremity   . Endocarditis 2002   subacute bacterial endocarditis.   . Family history of anesthesia complication    "my mom has a hard time coming out from under"  . Fibromyalgia   . GERD (gastroesophageal reflux disease)    occ  . Heart murmur   . Herpes simplex type 1 infection 01/16/2018  . History of blood transfusion    "just low blood count" (05/13/2013)  . Hyperlipidemia   . Hypertension   . Hypothyroidism   . Hypothyroidism, adult 03/21/2014  . Leukocytosis   . Necrosis ([place])    and ulceration  . Necrotizing fasciitis s/p OR debridements  07/06/2012  . Obesity   . OSA on CPAP    does not wear all the time  . Peripheral neuropathy   . Type II diabetes mellitus (HCC)    Type  II    Family History  Problem Relation Age of Onset  . Heart failure Mother   . Diabetes Mother   . Kidney disease Mother   . Kidney disease Father   . Diabetes Father   . Diabetes Paternal Grandmother   . Heart failure Paternal Grandmother   . Other Other     Past Surgical History:  Procedure Laterality Date  . ABDOMINAL AORTOGRAM  W/LOWER EXTREMITY Left 03/19/2019   Procedure: ABDOMINAL AORTOGRAM W/LOWER EXTREMITY;  Surgeon: Serafina Mitchell, MD;  Location: [facility];  Service: Cardiovascular;  Laterality: Left;  . AMPUTATION Left 04/07/2017   Procedure: LEFT BELOW KNEE AMPUTATION;  Surgeon: Newt Minion, MD;  Location: [facility];  Service: Orthopedics;  Laterality: Left;  . EYE SURGERY     [place]  . INCISION AND DRAINAGE ABSCESS     multiple I&Ds  . INCISION AND DRAINAGE ABSCESS Left 07/09/2012   Procedure: DRESSING CHANGE, THIGH WOUND;  Surgeon: Harl Bowie, MD;  Location: [facility];  Service: General;  Laterality: Left;  . INCISION AND DRAINAGE OF WOUND Left 07/07/2012   Procedure: IRRIGATION AND DEBRIDEMENT WOUND;  Surgeon: Harl Bowie, MD;  Location: [facility];  Service: General;  Laterality: Left;  . INCISION AND DRAINAGE PERIRECTAL ABSCESS Left 07/14/2012   Procedure: DEBRIDEMENT OF SKIN & SOFT TISSUE; DRESSING CHANGE UNDER ANESTHESIA;  Surgeon: Gayland Curry, MD,FACS;  Location: [facility];  Service: General;  Laterality: Left;  . INCISION AND DRAINAGE PERIRECTAL ABSCESS Left 07/16/2012   Procedure: I&D Left Thigh;  Surgeon: Gwenyth Ober, MD;  Location: [facility];  Service: General;  Laterality: Left;  . INCISION AND DRAINAGE PERIRECTAL ABSCESS N/A 01/05/2015   Procedure: IRRIGATION AND DEBRIDEMENT PERIRECTAL ABSCESS;  Surgeon: Donnie Mesa, MD;  Location: [facility];  Service: General;  Laterality: N/A;  . IRRIGATION AND DEBRIDEMENT  ABSCESS Left 07/06/2012   Procedure: IRRIGATION AND DEBRIDEMENT ABSCESS BUTTOCKS AND THIGH;  Surgeon: Shann Medal, MD;  Location: [facility];  Service: General;  Laterality: Left;  . IRRIGATION AND DEBRIDEMENT ABSCESS Left 08/10/2012   Procedure: IRRIGATION AND DEBRIDEMENT ABSCESS;  Surgeon: Madilyn Hook, DO;  Location: [facility];  Service: General;  Laterality: Left;  . STUMP REVISION Left 06/09/2017   Procedure: REVISION LEFT BELOW KNEE AMPUTATION;  Surgeon: Newt Minion, MD;  Location: [facility];  Service: Orthopedics;  Laterality: Left;  . STUMP REVISION Left 09/04/2019   Procedure: LEFT BELOW KNEE AMPUTATION REVISION;  Surgeon: Newt Minion, MD;  Location: [facility];  Service: Orthopedics;  Laterality: Left;  . TONSILLECTOMY  1994   Social History   Occupational History  . Occupation: disability  Tobacco Use  . Smoking status: Current Every Day Smoker    Packs/day: 0.50    Years: 16.00    Pack years: 8.00    Types: Cigarettes  . Smokeless tobacco: Never Used  . Tobacco comment: 1 PPD  Substance and Sexual Activity  . Alcohol use: Yes    Alcohol/week: 0.0 standard drinks    Comment: Socially-" monthly  maybe"  . Drug use: No  . Sexual activity: Yes

## 2019-09-13 ENCOUNTER — Telehealth: Payer: Self-pay | Admitting: Orthopedic Surgery

## 2019-09-13 ENCOUNTER — Ambulatory Visit: Payer: Self-pay | Admitting: *Deleted

## 2019-09-13 DIAGNOSIS — E1142 Type 2 diabetes mellitus with diabetic polyneuropathy: Secondary | ICD-10-CM | POA: Diagnosis not present

## 2019-09-13 DIAGNOSIS — E1159 Type 2 diabetes mellitus with other circulatory complications: Secondary | ICD-10-CM

## 2019-09-13 DIAGNOSIS — Z4781 Encounter for orthopedic aftercare following surgical amputation: Secondary | ICD-10-CM | POA: Diagnosis not present

## 2019-09-13 DIAGNOSIS — I150 Renovascular hypertension: Secondary | ICD-10-CM | POA: Diagnosis not present

## 2019-09-13 DIAGNOSIS — D649 Anemia, unspecified: Secondary | ICD-10-CM | POA: Diagnosis not present

## 2019-09-13 DIAGNOSIS — M545 Low back pain: Secondary | ICD-10-CM | POA: Diagnosis not present

## 2019-09-13 DIAGNOSIS — Z4801 Encounter for change or removal of surgical wound dressing: Secondary | ICD-10-CM | POA: Diagnosis not present

## 2019-09-13 DIAGNOSIS — N183 Chronic kidney disease, stage 3 unspecified: Secondary | ICD-10-CM | POA: Diagnosis not present

## 2019-09-13 DIAGNOSIS — I5032 Chronic diastolic (congestive) heart failure: Secondary | ICD-10-CM

## 2019-09-13 DIAGNOSIS — E1122 Type 2 diabetes mellitus with diabetic chronic kidney disease: Secondary | ICD-10-CM | POA: Diagnosis not present

## 2019-09-13 NOTE — Chronic Care Management (AMB) (Signed)
  Chronic Care Management   Note  09/13/2019 Name: [name] [name] MRN: [id number] DOB: 1976/01/17      Successful outreach to patient to address [facility] General Discharge Red Flag issues. [name] states she called Medicaid transportation and was able to get to her appointment with Dr Sharol Given for hospital follow up yesterday. She says she also has refilled her Rx except  For the gabapentin which cannot be refilled until next week. She did not have any further questions or concerns.  Provided information on the chronic care management (CCM) program for the internal medicine clinic and advised her to discuss with Dr. Tarri Abernethy when she sees him in June or contact this CCM RN if she wishes to enroll sooner.   Follow up plan: No further follow up required: patient advised of CCM program. She did not wish to enroll at this time.  Kelli Churn RN, CCM, [facility] RN Care Manager [phone]

## 2019-09-13 NOTE — Telephone Encounter (Signed)
[name] [name] from [facility] called to get VO's on [facility] OT for the following:  1x for 1 month.  Also she would like an order for social work consult.  [name] [name]'s CB#[phone].  Thank you.

## 2019-09-13 NOTE — Telephone Encounter (Signed)
Orders given.  

## 2019-09-13 NOTE — Telephone Encounter (Signed)
Please advise 

## 2019-09-17 ENCOUNTER — Telehealth: Payer: Self-pay | Admitting: *Deleted

## 2019-09-17 ENCOUNTER — Telehealth: Payer: Self-pay | Admitting: Internal Medicine

## 2019-09-17 DIAGNOSIS — N183 Chronic kidney disease, stage 3 unspecified: Secondary | ICD-10-CM | POA: Diagnosis not present

## 2019-09-17 DIAGNOSIS — M545 Low back pain: Secondary | ICD-10-CM | POA: Diagnosis not present

## 2019-09-17 DIAGNOSIS — D649 Anemia, unspecified: Secondary | ICD-10-CM | POA: Diagnosis not present

## 2019-09-17 DIAGNOSIS — Z4781 Encounter for orthopedic aftercare following surgical amputation: Secondary | ICD-10-CM | POA: Diagnosis not present

## 2019-09-17 DIAGNOSIS — I150 Renovascular hypertension: Secondary | ICD-10-CM | POA: Diagnosis not present

## 2019-09-17 DIAGNOSIS — E1142 Type 2 diabetes mellitus with diabetic polyneuropathy: Secondary | ICD-10-CM | POA: Diagnosis not present

## 2019-09-17 DIAGNOSIS — E1122 Type 2 diabetes mellitus with diabetic chronic kidney disease: Secondary | ICD-10-CM | POA: Diagnosis not present

## 2019-09-17 DIAGNOSIS — Z4801 Encounter for change or removal of surgical wound dressing: Secondary | ICD-10-CM | POA: Diagnosis not present

## 2019-09-17 DIAGNOSIS — E1159 Type 2 diabetes mellitus with other circulatory complications: Secondary | ICD-10-CM | POA: Diagnosis not present

## 2019-09-17 NOTE — Telephone Encounter (Signed)
She rtc call and stated she is having surgical pain, referred to dr duda's office

## 2019-09-17 NOTE — Telephone Encounter (Signed)
rtc to pt, went straight to vmail, lm fo rtc

## 2019-09-17 NOTE — Telephone Encounter (Signed)
Pt is requesting a call regarding pain medicine [phone]

## 2019-09-17 NOTE — Telephone Encounter (Addendum)
Information was sent through [facility] for PA for Nystatin Powder.  Awaiting determination within 3 days.  Case # [id number].  Sander Nephew, RN 09/17/2019 2:15 PM. PA approved for Nystatin Powder 09/18/2019 thru 05/22/2020.  Sander Nephew, RN  09/20/2019 11:26 AM

## 2019-09-17 NOTE — Telephone Encounter (Signed)
I called and lm on vm to advise pt that we would like to see her in the office today. To move her appt upo from Thursday to this afternoon if she will call the office to confirm.

## 2019-09-17 NOTE — Telephone Encounter (Signed)
Have her come in today to evaluate

## 2019-09-18 ENCOUNTER — Encounter (HOSPITAL_COMMUNITY): Payer: Self-pay | Admitting: *Deleted

## 2019-09-18 ENCOUNTER — Emergency Department (HOSPITAL_COMMUNITY): Payer: Medicare HMO

## 2019-09-18 ENCOUNTER — Inpatient Hospital Stay (HOSPITAL_COMMUNITY)
Admission: EM | Admit: 2019-09-18 | Discharge: 2019-09-26 | DRG: 501 | Disposition: A | Discharge status: Home Health | Payer: Medicare HMO | Attending: Internal Medicine | Admitting: Internal Medicine

## 2019-09-18 DIAGNOSIS — Z89512 Acquired absence of left leg below knee: Secondary | ICD-10-CM

## 2019-09-18 DIAGNOSIS — I129 Hypertensive chronic kidney disease with stage 1 through stage 4 chronic kidney disease, or unspecified chronic kidney disease: Secondary | ICD-10-CM | POA: Diagnosis not present

## 2019-09-18 DIAGNOSIS — Y92481 Parking lot as the place of occurrence of the external cause: Secondary | ICD-10-CM | POA: Diagnosis not present

## 2019-09-18 DIAGNOSIS — E1142 Type 2 diabetes mellitus with diabetic polyneuropathy: Secondary | ICD-10-CM | POA: Diagnosis present

## 2019-09-18 DIAGNOSIS — F410 Panic disorder [episodic paroxysmal anxiety] without agoraphobia: Secondary | ICD-10-CM | POA: Diagnosis present

## 2019-09-18 DIAGNOSIS — D638 Anemia in other chronic diseases classified elsewhere: Secondary | ICD-10-CM | POA: Diagnosis present

## 2019-09-18 DIAGNOSIS — T8131XA Disruption of external operation (surgical) wound, not elsewhere classified, initial encounter: Secondary | ICD-10-CM | POA: Diagnosis not present

## 2019-09-18 DIAGNOSIS — I959 Hypotension, unspecified: Secondary | ICD-10-CM | POA: Diagnosis not present

## 2019-09-18 DIAGNOSIS — T8781 Dehiscence of amputation stump: Secondary | ICD-10-CM | POA: Diagnosis not present

## 2019-09-18 DIAGNOSIS — E785 Hyperlipidemia, unspecified: Secondary | ICD-10-CM | POA: Diagnosis not present

## 2019-09-18 DIAGNOSIS — Z03818 Encounter for observation for suspected exposure to other biological agents ruled out: Secondary | ICD-10-CM | POA: Diagnosis not present

## 2019-09-18 DIAGNOSIS — I10 Essential (primary) hypertension: Secondary | ICD-10-CM | POA: Diagnosis not present

## 2019-09-18 DIAGNOSIS — Y835 Amputation of limb(s) as the cause of abnormal reaction of the patient, or of later complication, without mention of misadventure at the time of the procedure: Secondary | ICD-10-CM | POA: Diagnosis present

## 2019-09-18 DIAGNOSIS — E039 Hypothyroidism, unspecified: Secondary | ICD-10-CM | POA: Diagnosis present

## 2019-09-18 DIAGNOSIS — I5032 Chronic diastolic (congestive) heart failure: Secondary | ICD-10-CM | POA: Diagnosis present

## 2019-09-18 DIAGNOSIS — N183 Chronic kidney disease, stage 3 unspecified: Secondary | ICD-10-CM | POA: Diagnosis present

## 2019-09-18 DIAGNOSIS — M797 Fibromyalgia: Secondary | ICD-10-CM | POA: Diagnosis present

## 2019-09-18 DIAGNOSIS — F319 Bipolar disorder, unspecified: Secondary | ICD-10-CM | POA: Diagnosis present

## 2019-09-18 DIAGNOSIS — E1165 Type 2 diabetes mellitus with hyperglycemia: Secondary | ICD-10-CM | POA: Diagnosis not present

## 2019-09-18 DIAGNOSIS — K219 Gastro-esophageal reflux disease without esophagitis: Secondary | ICD-10-CM | POA: Diagnosis not present

## 2019-09-18 DIAGNOSIS — K5900 Constipation, unspecified: Secondary | ICD-10-CM | POA: Diagnosis not present

## 2019-09-18 DIAGNOSIS — E0590 Thyrotoxicosis, unspecified without thyrotoxic crisis or storm: Secondary | ICD-10-CM | POA: Diagnosis present

## 2019-09-18 DIAGNOSIS — W010XXA Fall on same level from slipping, tripping and stumbling without subsequent striking against object, initial encounter: Secondary | ICD-10-CM | POA: Diagnosis present

## 2019-09-18 DIAGNOSIS — Z79899 Other long term (current) drug therapy: Secondary | ICD-10-CM | POA: Diagnosis not present

## 2019-09-18 DIAGNOSIS — Z833 Family history of diabetes mellitus: Secondary | ICD-10-CM

## 2019-09-18 DIAGNOSIS — E875 Hyperkalemia: Secondary | ICD-10-CM | POA: Diagnosis not present

## 2019-09-18 DIAGNOSIS — W19XXXA Unspecified fall, initial encounter: Secondary | ICD-10-CM

## 2019-09-18 DIAGNOSIS — E1122 Type 2 diabetes mellitus with diabetic chronic kidney disease: Secondary | ICD-10-CM | POA: Diagnosis present

## 2019-09-18 DIAGNOSIS — E119 Type 2 diabetes mellitus without complications: Secondary | ICD-10-CM | POA: Diagnosis not present

## 2019-09-18 DIAGNOSIS — Y9301 Activity, walking, marching and hiking: Secondary | ICD-10-CM | POA: Diagnosis present

## 2019-09-18 DIAGNOSIS — N189 Chronic kidney disease, unspecified: Secondary | ICD-10-CM | POA: Diagnosis not present

## 2019-09-18 DIAGNOSIS — N1832 Chronic kidney disease, stage 3b: Secondary | ICD-10-CM | POA: Diagnosis not present

## 2019-09-18 DIAGNOSIS — S88119A Complete traumatic amputation at level between knee and ankle, unspecified lower leg, initial encounter: Secondary | ICD-10-CM | POA: Diagnosis not present

## 2019-09-18 DIAGNOSIS — I130 Hypertensive heart and chronic kidney disease with heart failure and stage 1 through stage 4 chronic kidney disease, or unspecified chronic kidney disease: Secondary | ICD-10-CM | POA: Diagnosis present

## 2019-09-18 DIAGNOSIS — M109 Gout, unspecified: Secondary | ICD-10-CM | POA: Diagnosis not present

## 2019-09-18 DIAGNOSIS — F17210 Nicotine dependence, cigarettes, uncomplicated: Secondary | ICD-10-CM | POA: Diagnosis present

## 2019-09-18 DIAGNOSIS — E208 Other hypoparathyroidism: Secondary | ICD-10-CM | POA: Diagnosis not present

## 2019-09-18 DIAGNOSIS — Z6841 Body Mass Index (BMI) 40.0 and over, adult: Secondary | ICD-10-CM | POA: Diagnosis not present

## 2019-09-18 DIAGNOSIS — S81802A Unspecified open wound, left lower leg, initial encounter: Secondary | ICD-10-CM | POA: Diagnosis not present

## 2019-09-18 DIAGNOSIS — D631 Anemia in chronic kidney disease: Secondary | ICD-10-CM | POA: Diagnosis not present

## 2019-09-18 DIAGNOSIS — T8130XA Disruption of wound, unspecified, initial encounter: Secondary | ICD-10-CM | POA: Diagnosis not present

## 2019-09-18 DIAGNOSIS — Z20822 Contact with and (suspected) exposure to covid-19: Secondary | ICD-10-CM | POA: Diagnosis not present

## 2019-09-18 DIAGNOSIS — Z720 Tobacco use: Secondary | ICD-10-CM | POA: Diagnosis not present

## 2019-09-18 DIAGNOSIS — I5030 Unspecified diastolic (congestive) heart failure: Secondary | ICD-10-CM | POA: Diagnosis not present

## 2019-09-18 LAB — CBC WITH DIFFERENTIAL/PLATELET
Abs Immature Granulocytes: 0.01 10*3/uL (ref 0.00–0.07)
Basophils Absolute: 0 10*3/uL (ref 0.0–0.1)
Basophils Relative: 0 %
Eosinophils Absolute: 0.1 10*3/uL (ref 0.0–0.5)
Eosinophils Relative: 2 %
HCT: 24.4 % — ABNORMAL LOW (ref 36.0–46.0)
Hemoglobin: 7.3 g/dL — ABNORMAL LOW (ref 12.0–15.0)
Immature Granulocytes: 0 %
Lymphocytes Relative: 38 %
Lymphs Abs: 2.3 10*3/uL (ref 0.7–4.0)
MCH: 25.8 pg — ABNORMAL LOW (ref 26.0–34.0)
MCHC: 29.9 g/dL — ABNORMAL LOW (ref 30.0–36.0)
MCV: 86.2 fL (ref 80.0–100.0)
Monocytes Absolute: 0.4 10*3/uL (ref 0.1–1.0)
Monocytes Relative: 7 %
Neutro Abs: 3.1 10*3/uL (ref 1.7–7.7)
Neutrophils Relative %: 53 %
Platelets: 283 10*3/uL (ref 150–400)
RBC: 2.83 MIL/uL — ABNORMAL LOW (ref 3.87–5.11)
RDW: 14.8 % (ref 11.5–15.5)
WBC: 5.9 10*3/uL (ref 4.0–10.5)
nRBC: 0 % (ref 0.0–0.2)

## 2019-09-18 LAB — COMPREHENSIVE METABOLIC PANEL
ALT: 7 U/L (ref 0–44)
AST: 9 U/L — ABNORMAL LOW (ref 15–41)
Albumin: 2.3 g/dL — ABNORMAL LOW (ref 3.5–5.0)
Alkaline Phosphatase: 70 U/L (ref 38–126)
Anion gap: 6 (ref 5–15)
BUN: 30 mg/dL — ABNORMAL HIGH (ref 6–20)
CO2: 25 mmol/L (ref 22–32)
Calcium: 8.4 mg/dL — ABNORMAL LOW (ref 8.9–10.3)
Chloride: 108 mmol/L (ref 98–111)
Creatinine, Ser: 2.31 mg/dL — ABNORMAL HIGH (ref 0.44–1.00)
GFR calc Af Amer: 29 mL/min — ABNORMAL LOW (ref >60)
GFR calc non Af Amer: 25 mL/min — ABNORMAL LOW (ref >60)
Glucose, Bld: 224 mg/dL — ABNORMAL HIGH (ref 70–99)
Potassium: 4.4 mmol/L (ref 3.5–5.1)
Sodium: 139 mmol/L (ref 135–145)
Total Bilirubin: 0.4 mg/dL (ref 0.3–1.2)
Total Protein: 6.2 g/dL — ABNORMAL LOW (ref 6.5–8.1)

## 2019-09-18 LAB — RESPIRATORY PANEL BY RT PCR (FLU A&B, COVID)
Influenza A by PCR: NEGATIVE
Influenza B by PCR: NEGATIVE
SARS Coronavirus 2 by RT PCR: NEGATIVE

## 2019-09-18 MED ORDER — MORPHINE SULFATE (PF) 4 MG/ML IV SOLN
4.0000 mg | Freq: Once | INTRAVENOUS | Status: AC
  Administered 2019-09-18: 4 mg via INTRAVENOUS
  Filled 2019-09-18: qty 1

## 2019-09-18 MED ORDER — ACETAMINOPHEN 325 MG PO TABS
650.0000 mg | ORAL_TABLET | Freq: Four times a day (QID) | ORAL | Status: DC | PRN
  Administered 2019-09-20 (×2): 650 mg via ORAL
  Filled 2019-09-18 (×2): qty 2

## 2019-09-18 MED ORDER — ONDANSETRON HCL 4 MG/2ML IJ SOLN
4.0000 mg | Freq: Once | INTRAMUSCULAR | Status: AC
  Administered 2019-09-18: 4 mg via INTRAVENOUS
  Filled 2019-09-18: qty 2

## 2019-09-18 MED ORDER — PREGABALIN 25 MG PO CAPS
50.0000 mg | ORAL_CAPSULE | Freq: Three times a day (TID) | ORAL | Status: DC
  Administered 2019-09-19 – 2019-09-26 (×21): 50 mg via ORAL
  Filled 2019-09-18 (×20): qty 2

## 2019-09-18 MED ORDER — AMLODIPINE BESYLATE 10 MG PO TABS
10.0000 mg | ORAL_TABLET | Freq: Every day | ORAL | Status: DC
  Administered 2019-09-19 – 2019-09-22 (×4): 10 mg via ORAL
  Filled 2019-09-18: qty 2
  Filled 2019-09-18 (×2): qty 1
  Filled 2019-09-18: qty 2
  Filled 2019-09-18 (×2): qty 1

## 2019-09-18 MED ORDER — OXYCODONE HCL 5 MG PO TABS
5.0000 mg | ORAL_TABLET | ORAL | Status: DC | PRN
  Administered 2019-09-19: 5 mg via ORAL
  Administered 2019-09-19 – 2019-09-23 (×10): 10 mg via ORAL
  Administered 2019-09-24: 5 mg via ORAL
  Administered 2019-09-24: 10 mg via ORAL
  Administered 2019-09-24: 5 mg via ORAL
  Administered 2019-09-24 – 2019-09-26 (×9): 10 mg via ORAL
  Filled 2019-09-18 (×9): qty 2
  Filled 2019-09-18: qty 1
  Filled 2019-09-18 (×12): qty 2
  Filled 2019-09-18 (×2): qty 1
  Filled 2019-09-18: qty 2

## 2019-09-18 MED ORDER — CEFAZOLIN SODIUM-DEXTROSE 1-4 GM/50ML-% IV SOLN
1.0000 g | Freq: Once | INTRAVENOUS | Status: AC
  Administered 2019-09-18: 1 g via INTRAVENOUS
  Filled 2019-09-18: qty 50

## 2019-09-18 MED ORDER — BUMETANIDE 1 MG PO TABS
1.0000 mg | ORAL_TABLET | Freq: Every day | ORAL | Status: DC
  Administered 2019-09-19 – 2019-09-25 (×7): 1 mg via ORAL
  Filled 2019-09-18 (×8): qty 1

## 2019-09-18 MED ORDER — ATORVASTATIN CALCIUM 10 MG PO TABS
20.0000 mg | ORAL_TABLET | Freq: Every day | ORAL | Status: DC
  Administered 2019-09-19 – 2019-09-26 (×8): 20 mg via ORAL
  Filled 2019-09-18 (×9): qty 2

## 2019-09-18 MED ORDER — OXYCODONE-ACETAMINOPHEN 5-325 MG PO TABS: 2.0000 | ORAL_TABLET | Freq: Once | ORAL | Status: DC

## 2019-09-18 MED ORDER — ACETAMINOPHEN 650 MG RE SUPP: 650.0000 mg | Freq: Four times a day (QID) | RECTAL | Status: DC | PRN

## 2019-09-18 MED ORDER — FLUOXETINE HCL 20 MG PO CAPS
60.0000 mg | ORAL_CAPSULE | Freq: Every day | ORAL | Status: DC
  Administered 2019-09-19 – 2019-09-26 (×7): 60 mg via ORAL
  Filled 2019-09-18 (×9): qty 3

## 2019-09-18 MED ORDER — NICOTINE 14 MG/24HR TD PT24
14.0000 mg | MEDICATED_PATCH | Freq: Every day | TRANSDERMAL | Status: DC
  Administered 2019-09-19 – 2019-09-26 (×9): 14 mg via TRANSDERMAL
  Filled 2019-09-18 (×9): qty 1

## 2019-09-18 MED ORDER — BUMETANIDE 2 MG PO TABS
2.0000 mg | ORAL_TABLET | ORAL | Status: DC
  Administered 2019-09-21 – 2019-09-26 (×5): 2 mg via ORAL
  Filled 2019-09-18 (×8): qty 1
  Filled 2019-09-18: qty 2
  Filled 2019-09-18: qty 1

## 2019-09-18 MED ORDER — CYCLOBENZAPRINE HCL 10 MG PO TABS
5.0000 mg | ORAL_TABLET | Freq: Every day | ORAL | Status: DC | PRN
  Administered 2019-09-21 – 2019-09-25 (×4): 5 mg via ORAL
  Filled 2019-09-18 (×4): qty 1

## 2019-09-18 MED ORDER — AMITRIPTYLINE HCL 50 MG PO TABS
50.0000 mg | ORAL_TABLET | Freq: Every day | ORAL | Status: DC
  Administered 2019-09-19 – 2019-09-25 (×8): 50 mg via ORAL
  Filled 2019-09-18 (×7): qty 1
  Filled 2019-09-18: qty 2

## 2019-09-18 MED ORDER — METOPROLOL TARTRATE 25 MG PO TABS
25.0000 mg | ORAL_TABLET | Freq: Two times a day (BID) | ORAL | Status: DC
  Administered 2019-09-19 – 2019-09-22 (×8): 25 mg via ORAL
  Filled 2019-09-18 (×9): qty 1

## 2019-09-18 MED ORDER — INSULIN ASPART 100 UNIT/ML ~~LOC~~ SOLN
0.0000 [IU] | Freq: Three times a day (TID) | SUBCUTANEOUS | Status: DC
  Administered 2019-09-19: 3 [IU] via SUBCUTANEOUS
  Administered 2019-09-19: 2 [IU] via SUBCUTANEOUS
  Administered 2019-09-19 – 2019-09-20 (×2): 3 [IU] via SUBCUTANEOUS
  Administered 2019-09-21: 11 [IU] via SUBCUTANEOUS
  Administered 2019-09-21: 8 [IU] via SUBCUTANEOUS
  Administered 2019-09-21: 11 [IU] via SUBCUTANEOUS
  Administered 2019-09-22: 3 [IU] via SUBCUTANEOUS
  Administered 2019-09-22: 11 [IU] via SUBCUTANEOUS
  Administered 2019-09-23 – 2019-09-24 (×2): 2 [IU] via SUBCUTANEOUS
  Administered 2019-09-24 – 2019-09-25 (×2): 3 [IU] via SUBCUTANEOUS
  Administered 2019-09-25: 2 [IU] via SUBCUTANEOUS

## 2019-09-18 MED ORDER — LISINOPRIL 10 MG PO TABS
10.0000 mg | ORAL_TABLET | Freq: Every day | ORAL | Status: DC
  Administered 2019-09-21 – 2019-09-22 (×2): 10 mg via ORAL
  Filled 2019-09-18 (×2): qty 1

## 2019-09-18 NOTE — H&P (Signed)
Date: 09/18/2019               [name] [name] Name:  [name] [name] MRN: [id number]  DOB: 1976-03-27 Age / Sex: 44 y.o., female   PCP: Ina Homes, MD         Medical Service: Internal Medicine Teaching Service         Attending Physician: Dr. Sid Falcon, MD    First Contact: Dr. Court Joy Pager: [phone]  Second Contact: Dr. Truman Hayward Pager: [phone]       After Hours (After 5p/  First Contact Pager: [phone]  weekends / holidays): Second Contact Pager: [phone]   Chief Complaint: wound dehiscence   History of Present Illness:   [name] [name] is a 44 yo F w/ a PMHx of left leg osteomyelitis s/p BKA, HTN, HLD, fibromyalgia, DM, HFpEF and hyperthyroidism presenting with wound dehiscence.  [name] [name] states [name] [name] was in her usual state of health until earlier today when [name] [name] tripped on [name] parking block and hit [name] end of her stump. [name] [name] notes [name] stitches opened up and her wound began to bleed. [name] [name] denies any pustular drainage. [name] [name] reports 10/10 pain since [name] accident. [name] [name] states it is a stabbing pain just at [name] end of her stump that does not radiate. [name] [name] denies fevers, chills, sore throat, rhinorrhea, chest pain, SOB, abdominal pain, dysuria and LOC. [name] [name] reports coming in because [name] [name] wasn't sure what else to do.   Meds:   Current Meds  Medication Sig  . allopurinol (ZYLOPRIM) 100 MG tablet Take 1 tablet (100 mg total) by mouth 2 (two) times daily.  Marland Kitchen amitriptyline (ELAVIL) 50 MG tablet Take 1 tablet (50 mg total) by mouth at bedtime.  Marland Kitchen amLODipine (NORVASC) 10 MG tablet TAKE 1 TABLET BY MOUTH EVERY DAY ([name] [name] taking differently: Take 10 mg by mouth daily. )  . atorvastatin (LIPITOR) 20 MG tablet Take 1 tablet (20 mg total) by mouth daily.  . bumetanide (BUMEX) 1 MG tablet TAKE 1 TABLET BY MOUTH 2 TIMES DAILY ([name] [name] taking differently: Take 1 mg by mouth See admin instructions. Takes 2 mg in [name] morning and 1 mg in [name] afternoon.)  . Colchicine 0.6 MG CAPS Take 0.6 mg by mouth  daily. ([name] [name] taking differently: Take 0.6 mg by mouth as needed (gout). )  . cyclobenzaprine (FLEXERIL) 5 MG tablet Take 1 tablet (5 mg total) by mouth daily as needed for muscle spasms.  . diclofenac Sodium (VOLTAREN) 1 % GEL Apply 1 application topically 4 (four) times daily as needed (pain).  Marland Kitchen FLUoxetine (PROZAC) 20 MG/5ML solution Take 15 mLs (60 mg total) by mouth daily.  . hydrOXYzine (ATARAX/VISTARIL) 25 MG tablet TAKE 1 TABLET BY MOUTH EVERY SIX HOURS AS NEEDED FOR ANXIETY ([name] [name] taking differently: Take 25 mg by mouth every 6 (six) hours as needed for anxiety. )  . lisinopril (ZESTRIL) 10 MG tablet TAKE 1 TABLET BY MOUTH EVERY DAY ([name] [name] taking differently: Take 10 mg by mouth daily. )  . metoprolol tartrate (LOPRESSOR) 25 MG tablet Take 0.5 tablets (12.5 mg total) by mouth 2 (two) times daily. ([name] [name] taking differently: Take 25 mg by mouth 2 (two) times daily. )  . mupirocin ointment (BACTROBAN) 2 % Apply 1 application topically 2 (two) times daily.  Marland Kitchen nystatin (MYCOSTATIN/NYSTOP) powder Apply topically 4 times daily. After cleaning and drying [name] affected area for two weeks ([name] [name] taking differently: Apply 1 application topically in [name] morning, at noon, in [name] evening, and at bedtime. )  .  oxyCODONE (OXY IR/ROXICODONE) 5 MG immediate release tablet Take 1-2 tablets (5-10 mg total) by mouth every 4 (four) hours as needed for moderate pain (pain score 4-6).  . pregabalin (LYRICA) 50 MG capsule Take 1 capsule (50 mg total) by mouth 2 (two) times daily. ([name] [name] taking differently: Take 50 mg by mouth 3 (three) times daily. )  . terbinafine (LAMISIL) 250 MG tablet Take 1 tablet (250 mg total) by mouth daily.  Marland Kitchen tetrahydrozoline 0.05 % ophthalmic solution Place 2 drops into both eyes 2 (two) times daily as needed (red/ dry eyes). Al[place]  . VICTOZA 18 MG/3ML SOPN Inject 0.3 mLs (1.8 mg total) below [name] skin daily. ([name] [name] taking differently: Inject 1.8 mg into [name] skin daily. )    Allergies: Allergies as of 09/18/2019 - Review Complete 09/18/2019  Allergen Reaction Noted  . Vancomycin Other (See Comments) 08/24/2012   Past Medical History:  Diagnosis Date  . Abdominal muscle pain 09/08/2016  . Abnormal Pap smear of cervix 2009  . Abscess    history of multiple abscesses  . Acute bilateral low back pain 02/13/2017  . Acute blood loss anemia   . Acute renal failure ([place]) 07/12/2012  . AKI (acute kidney injury) ([place])   . Anemia of chronic disease 2002  . Anxiety    Panic attacks  . Bilateral lower extremity edema 05/13/2016  . Bipolar disorder ([place])   . Cellulitis 05/21/2014   right eye  . Chronic bronchitis ([place])    "get it q yr" (05/13/2013)  . Chronic pain   . Depression   . Edema of lower extremity   . Endocarditis 2002   subacute bacterial endocarditis.   . Family history of anesthesia complication    "my mom has a hard time coming out from under"  . Fibromyalgia   . GERD (gastroesophageal reflux disease)    occ  . Heart murmur   . Herpes simplex type 1 infection 01/16/2018  . History of blood transfusion    "just low blood count" (05/13/2013)  . Hyperlipidemia   . Hypertension   . Hypothyroidism   . Hypothyroidism, adult 03/21/2014  . Leukocytosis   . Necrosis ([place])    and ulceration  . Necrotizing fasciitis s/p OR debridements 07/06/2012  . Obesity   . OSA on CPAP    does not wear all [name] time  . Peripheral neuropathy   . Type II diabetes mellitus ([place])    Type  II    Past Surgical History:  Procedure Laterality Date  . ABDOMINAL AORTOGRAM W/LOWER EXTREMITY Left 03/19/2019   Procedure: ABDOMINAL AORTOGRAM W/LOWER EXTREMITY;  Surgeon: Serafina Mitchell, MD;  Location: [facility];  Service: Cardiovascular;  Laterality: Left;  . AMPUTATION Left 04/07/2017   Procedure: LEFT BELOW KNEE AMPUTATION;  Surgeon: Newt Minion, MD;  Location: [facility];  Service: Orthopedics;  Laterality: Left;  . EYE SURGERY     [place]  . INCISION AND  DRAINAGE ABSCESS     multiple I&Ds  . INCISION AND DRAINAGE ABSCESS Left 07/09/2012   Procedure: DRESSING CHANGE, THIGH WOUND;  Surgeon: Harl Bowie, MD;  Location: [facility];  Service: General;  Laterality: Left;  . INCISION AND DRAINAGE OF WOUND Left 07/07/2012   Procedure: IRRIGATION AND DEBRIDEMENT WOUND;  Surgeon: Harl Bowie, MD;  Location: [facility];  Service: General;  Laterality: Left;  . INCISION AND DRAINAGE PERIRECTAL ABSCESS Left 07/14/2012   Procedure: DEBRIDEMENT OF SKIN & SOFT TISSUE; DRESSING CHANGE UNDER ANESTHESIA;  Surgeon: Gayland Curry, MD,FACS;  Location: [facility];  Service: General;  Laterality: Left;  . INCISION AND DRAINAGE PERIRECTAL ABSCESS Left 07/16/2012   Procedure: I&D Left Thigh;  Surgeon: Gwenyth Ober, MD;  Location: [facility];  Service: General;  Laterality: Left;  . INCISION AND DRAINAGE PERIRECTAL ABSCESS N/A 01/05/2015   Procedure: IRRIGATION AND DEBRIDEMENT PERIRECTAL ABSCESS;  Surgeon: Donnie Mesa, MD;  Location: [facility];  Service: General;  Laterality: N/A;  . IRRIGATION AND DEBRIDEMENT ABSCESS Left 07/06/2012   Procedure: IRRIGATION AND DEBRIDEMENT ABSCESS BUTTOCKS AND THIGH;  Surgeon: Shann Medal, MD;  Location: [facility];  Service: General;  Laterality: Left;  . IRRIGATION AND DEBRIDEMENT ABSCESS Left 08/10/2012   Procedure: IRRIGATION AND DEBRIDEMENT ABSCESS;  Surgeon: Madilyn Hook, DO;  Location: [facility];  Service: General;  Laterality: Left;  . STUMP REVISION Left 06/09/2017   Procedure: REVISION LEFT BELOW KNEE AMPUTATION;  Surgeon: Newt Minion, MD;  Location: [facility];  Service: Orthopedics;  Laterality: Left;  . STUMP REVISION Left 09/04/2019   Procedure: LEFT BELOW KNEE AMPUTATION REVISION;  Surgeon: Newt Minion, MD;  Location: [facility];  Service: Orthopedics;  Laterality: Left;  . TONSILLECTOMY  1994    Family History:  Family History  Problem Relation Age of Onset  . Heart failure Mother   . Diabetes Mother   . Kidney disease Mother   . Kidney  disease Father   . Diabetes Father   . Diabetes Paternal Grandmother   . Heart failure Paternal Grandmother   . Other Other    Social History:  Social History   Tobacco Use  . Smoking status: Current Every Day Smoker    Packs/day: 0.50    Years: 16.00    Pack years: 8.00    Types: Cigarettes  . Smokeless tobacco: Never Used  . Tobacco comment: 1 PPD  Substance Use Topics  . Alcohol use: Yes    Alcohol/week: 0.0 standard drinks    Comment: Socially-" monthly  maybe"  . Drug use: No   Review of Systems: A complete ROS was negative except as per HPI.   Physical Exam: Blood pressure 111/61, pulse 74, SpO2 96 %. Physical Exam  Constitutional: [name] [name] is oriented to person, place, and time. No distress.  Cardiovascular: Normal rate, regular rhythm and intact distal pulses.  Murmur (3/6 systolic) heard. Pulmonary/Chest: Effort normal and breath sounds normal. No respiratory distress. [name] [name] exhibits no tenderness.  Abdominal: Soft. Bowel sounds are normal. [name] [name] exhibits no distension. There is no abdominal tenderness.  Musculoskeletal:        General: Normal range of motion.     Comments: Left BKA stump in clean dry bandage without obvious bleeding, discharge, surrounding erythema or warmth  Neurological: [name] [name] is alert and oriented to person, place, and time.  Skin: Skin is warm and dry. [name] [name] is not diaphoretic. No erythema.  Psychiatric: Affect normal.  Nursing note and vitals reviewed.  Labs: Results for orders placed or performed during [name] hospital encounter of 09/18/19 (from [name] past 24 hour(s))  Comprehensive metabolic panel     Status: Abnormal   Collection Time: 09/18/19  5:41 PM  Result Value Ref Range   Sodium 139 135 - 145 mmol/L   Potassium 4.4 3.5 - 5.1 mmol/L   Chloride 108 98 - 111 mmol/L   CO2 25 22 - 32 mmol/L   Glucose, Bld 224 (H) 70 - 99 mg/dL   BUN 30 (H) 6 - 20 mg/dL   Creatinine, Ser  2.31 (H) 0.44 - 1.00 mg/dL   Calcium 8.4 (L) 8.9 - 10.3 mg/dL   Total  Protein 6.2 (L) 6.5 - 8.1 g/dL   Albumin 2.3 (L) 3.5 - 5.0 g/dL   AST 9 (L) 15 - 41 U/L   ALT 7 0 - 44 U/L   Alkaline Phosphatase 70 38 - 126 U/L   Total Bilirubin 0.4 0.3 - 1.2 mg/dL   GFR calc non Af Amer 25 (L) >60 mL/min   GFR calc Af Amer 29 (L) >60 mL/min   Anion gap 6 5 - 15  CBC with Differential     Status: Abnormal   Collection Time: 09/18/19  5:41 PM  Result Value Ref Range   WBC 5.9 4.0 - 10.5 K/uL   RBC 2.83 (L) 3.87 - 5.11 MIL/uL   Hemoglobin 7.3 (L) 12.0 - 15.0 g/dL   HCT 24.4 (L) 36.0 - 46.0 %   MCV 86.2 80.0 - 100.0 fL   MCH 25.8 (L) 26.0 - 34.0 pg   MCHC 29.9 (L) 30.0 - 36.0 g/dL   RDW 14.8 11.5 - 15.5 %   Platelets 283 150 - 400 K/uL   nRBC 0.0 0.0 - 0.2 %   Neutrophils Relative % 53 %   Neutro Abs 3.1 1.7 - 7.7 K/uL   Lymphocytes Relative 38 %   Lymphs Abs 2.3 0.7 - 4.0 K/uL   Monocytes Relative 7 %   Monocytes Absolute 0.4 0.1 - 1.0 K/uL   Eosinophils Relative 2 %   Eosinophils Absolute 0.1 0.0 - 0.5 K/uL   Basophils Relative 0 %   Basophils Absolute 0.0 0.0 - 0.1 K/uL   Immature Granulocytes 0 %   Abs Immature Granulocytes 0.01 0.00 - 0.07 K/uL  Respiratory Panel by RT PCR (Flu A&B, Covid) - Nasopharyngeal Swab     Status: None   Collection Time: 09/18/19  6:03 PM   Specimen: Nasopharyngeal Swab  Result Value Ref Range   SARS Coronavirus 2 by RT PCR NEGATIVE NEGATIVE   Influenza A by PCR NEGATIVE NEGATIVE   Influenza B by PCR NEGATIVE NEGATIVE   DG Knee 2 Views Left  Result Date: 09/18/2019 CLINICAL DATA:  [name] [name] with wound to [name] amputation site. EXAM: LEFT KNEE - 1-2 VIEW COMPARISON:  None. FINDINGS: Soft tissue overlying [name] distal aspect of [name] amputated tibia and fibula contains gas. No evidence for acute fracture or dislocation. Knee joint degenerative changes. Amputation of [name] distal aspect of [name] tibia and fibula. IMPRESSION: Gas within [name] soft tissues overlying [name] distal aspect of [name] tibia and fibula most compatible with reported injury.  No definite evidence for acute fracture. Electronically Signed   By: Lovey Newcomer M.D.   On: 09/18/2019 18:27    Assessment:  [name] [name] is a 44 yo F w/ a PMHx of left leg osteomyelitis s/p BKA, HTN, HLD, fibromyalgia, DM, HFpEF, anemia of chronic disease and hyperthyroidism presenting with wound dehiscence after undergoing trauma to her stump.  Plan by Problem:  Left leg osteomyelitis s/p BKA: Wound dehiscence: -[name] [name] states [name] [name] was in her usual state of health until earlier today when [name] [name] tripped on [name] parking block and hit [name] end of her stump. [name] [name] notes [name] stitches opened up and her wound began to bleed. [name] [name] denies any pustular drainage.  -[name] [name] [name] reports 10/10 stabbing pain just at [name] end of her stump that does not radiate.  -[name] [name] denies fevers and chills  -afebrile on admission, no WBC -x-ray of [name] L  knee shows no definite evidence of fracture -ortho consulted in ED and will plan for surgical exploration tomorrow or [name] next day depending on OR availability  Plan: -pain control with tylenol and oxycodone -monitor fever curve -monitor WBC -fu ortho recs  HTN: -continue home amlodipine, bumetanide, metoprolol and lisinopril  HLD: -continue home lipitor  Fibromyalgia: -continue home amitriptyline, oxycodone, flexeril and lyrica  DM: -SSI  HFpEF: -no signs of volume overload -continue home BP meds as above  Anemia of Chronic Disease: -Hgb 7.3 on presentation, down from 8-8.4 a few weeks ago -this could be due to acute blood loss from her wound dehiscence as above -not symptomatic  Plan: -follow Hgb daily  Anxiety: -continue home prozac   Tobacco Use: -counseled on importance of tobacco cessation -[name] states [name] [name] is always trying to quit -tried chantix without success -interested in smoking cessation assistance on discharge -nicotine replacement while here   Active Problems:   Wound dehiscence  Dispo: Admit [name] [name] to Inpatient with expected length  of stay greater than 2 midnights.  Signed: Al Decant, MD 09/18/2019, 10:30 PM  Pager: [phone]

## 2019-09-18 NOTE — ED Notes (Signed)
Pt transported to xray 

## 2019-09-18 NOTE — ED Provider Notes (Signed)
Emergency Department Provider Note   I have reviewed the triage vital signs and the nursing notes.   HISTORY  Chief Complaint Leg Injury   HPI [name] [name] is a 44 y.o. female with past medical history reviewed below including a BKA revision on 4/14 with Dr. Sharol Given with underlying osteomyelitis presents to the emergency department after fall and wound dehiscence.  Patient states that she been doing well since her surgery and was walking with crutches when she tripped and fell landing primarily on her stump.  She had pain and bleeding immediately and noticed that the wound had ripped open.  She denies hitting her head or other part of her body.  She denies any recent fevers or shaking chills.  Denies shortness of breath or chest pain.    Past Medical History:  Diagnosis Date  . Abdominal muscle pain 09/08/2016  . Abnormal Pap smear of cervix 2009  . Abscess    history of multiple abscesses  . Acute bilateral low back pain 02/13/2017  . Acute blood loss anemia   . Acute renal failure ([place]) 07/12/2012  . AKI (acute kidney injury) ([place])   . Anemia of chronic disease 2002  . Anxiety    Panic attacks  . Bilateral lower extremity edema 05/13/2016  . Bipolar disorder ([place])   . Cellulitis 05/21/2014   right eye  . Chronic bronchitis ([place])    "get it q yr" (05/13/2013)  . Chronic pain   . Depression   . Edema of lower extremity   . Endocarditis 2002   subacute bacterial endocarditis.   . Family history of anesthesia complication    "my mom has a hard time coming out from under"  . Fibromyalgia   . GERD (gastroesophageal reflux disease)    occ  . Heart murmur   . Herpes simplex type 1 infection 01/16/2018  . History of blood transfusion    "just low blood count" (05/13/2013)  . Hyperlipidemia   . Hypertension   . Hypothyroidism   . Hypothyroidism, adult 03/21/2014  . Leukocytosis   . Necrosis ([place])    and ulceration  . Necrotizing fasciitis s/p OR debridements  07/06/2012  . Obesity   . OSA on CPAP    does not wear all the time  . Peripheral neuropathy   . Type II diabetes mellitus ([place])    Type  II    Patient Active Problem List   Diagnosis Date Noted  . Wound dehiscence 09/04/2019  . Right carpal tunnel syndrome 06/14/2019  . Left carpal tunnel syndrome 06/14/2019  . Tobacco use 03/01/2019  . Phantom limb pain ([place]) 11/08/2018  . Current moderate episode of major depressive disorder ([place])   . Anemia of chronic disease   . Severe protein-calorie malnutrition ([place])   . Dehiscence of amputation stump ([place])   . Fall   . Status post below knee amputation, left ([place]) 04/11/2017  . Bipolar affective disorder ([place])   . Adjustment disorder with depressed mood 03/26/2017  . Heart failure with preserved ejection fraction ([place]) 02/09/2017  . Routine adult health maintenance 12/08/2016  . Chronic pain of right knee 09/08/2016  . Chronic kidney disease (CKD), stage III (moderate) 07/15/2016  . OSA (obstructive sleep apnea) 05/13/2016  . Morbid obesity due to excess calories ([place]) 11/27/2015  . Hypothyroidism 04/25/2013  . Controlled type 2 diabetes mellitus ([place]) 07/12/2012  . Carpal tunnel syndrome, bilateral 01/25/2012  . Diabetic peripheral neuropathy ([place]) 07/04/2011  . Anxiety and depression 06/02/2011  .  GERD 12/06/2007  . Hyperlipidemia 08/29/2007  . Hypertension associated with diabetes ([place]) 08/29/2007    Past Surgical History:  Procedure Laterality Date  . ABDOMINAL AORTOGRAM W/LOWER EXTREMITY Left 03/19/2019   Procedure: ABDOMINAL AORTOGRAM W/LOWER EXTREMITY;  Surgeon: Serafina Mitchell, MD;  Location: [facility];  Service: Cardiovascular;  Laterality: Left;  . AMPUTATION Left 04/07/2017   Procedure: LEFT BELOW KNEE AMPUTATION;  Surgeon: Newt Minion, MD;  Location: [facility];  Service: Orthopedics;  Laterality: Left;  . EYE SURGERY     lazer  . INCISION AND DRAINAGE ABSCESS     multiple I&Ds  . INCISION AND DRAINAGE  ABSCESS Left 07/09/2012   Procedure: DRESSING CHANGE, THIGH WOUND;  Surgeon: Harl Bowie, MD;  Location: [facility];  Service: General;  Laterality: Left;  . INCISION AND DRAINAGE OF WOUND Left 07/07/2012   Procedure: IRRIGATION AND DEBRIDEMENT WOUND;  Surgeon: Harl Bowie, MD;  Location: [facility];  Service: General;  Laterality: Left;  . INCISION AND DRAINAGE PERIRECTAL ABSCESS Left 07/14/2012   Procedure: DEBRIDEMENT OF SKIN & SOFT TISSUE; DRESSING CHANGE UNDER ANESTHESIA;  Surgeon: Gayland Curry, MD,FACS;  Location: [facility];  Service: General;  Laterality: Left;  . INCISION AND DRAINAGE PERIRECTAL ABSCESS Left 07/16/2012   Procedure: I&D Left Thigh;  Surgeon: Gwenyth Ober, MD;  Location: [facility];  Service: General;  Laterality: Left;  . INCISION AND DRAINAGE PERIRECTAL ABSCESS N/A 01/05/2015   Procedure: IRRIGATION AND DEBRIDEMENT PERIRECTAL ABSCESS;  Surgeon: Donnie Mesa, MD;  Location: [facility];  Service: General;  Laterality: N/A;  . IRRIGATION AND DEBRIDEMENT ABSCESS Left 07/06/2012   Procedure: IRRIGATION AND DEBRIDEMENT ABSCESS BUTTOCKS AND THIGH;  Surgeon: Shann Medal, MD;  Location: [facility];  Service: General;  Laterality: Left;  . IRRIGATION AND DEBRIDEMENT ABSCESS Left 08/10/2012   Procedure: IRRIGATION AND DEBRIDEMENT ABSCESS;  Surgeon: Madilyn Hook, DO;  Location: [facility];  Service: General;  Laterality: Left;  . STUMP REVISION Left 06/09/2017   Procedure: REVISION LEFT BELOW KNEE AMPUTATION;  Surgeon: Newt Minion, MD;  Location: [facility];  Service: Orthopedics;  Laterality: Left;  . STUMP REVISION Left 09/04/2019   Procedure: LEFT BELOW KNEE AMPUTATION REVISION;  Surgeon: Newt Minion, MD;  Location: [facility];  Service: Orthopedics;  Laterality: Left;  . TONSILLECTOMY  1994    Allergies Vancomycin  Family History  Problem Relation Age of Onset  . Heart failure Mother   . Diabetes Mother   . Kidney disease Mother   . Kidney disease Father   . Diabetes Father   . Diabetes Paternal  Grandmother   . Heart failure Paternal Grandmother   . Other Other     Social History Social History   Tobacco Use  . Smoking status: Current Every Day Smoker    Packs/day: 0.50    Years: 16.00    Pack years: 8.00    Types: Cigarettes  . Smokeless tobacco: Never Used  . Tobacco comment: 1 PPD  Substance Use Topics  . Alcohol use: Yes    Alcohol/week: 0.0 standard drinks    Comment: Socially-" monthly  maybe"  . Drug use: No    Review of Systems  Constitutional: No fever/chills Eyes: No visual changes. ENT: No sore throat. Cardiovascular: Denies chest pain. Respiratory: Denies shortness of breath. Gastrointestinal: No abdominal pain.  No nausea, no vomiting.  No diarrhea.  No constipation. Genitourinary: Negative for dysuria. Musculoskeletal: Negative for back pain. Skin: Wound opening after fall on the  left leg.  Neurological: Negative for headaches, focal weakness or numbness.  10-point ROS otherwise negative.  ____________________________________________   PHYSICAL EXAM:  VITAL SIGNS: Temp: 97.8 F Pulse: 77 BP: 127/67 SpO2: 99%  Constitutional: Alert and oriented. Well appearing and in no acute distress. Eyes: Conjunctivae are normal.  Head: Atraumatic. Nose: No congestion/rhinnorhea. Mouth/Throat: Mucous membranes are moist. Neck: No stridor.   Cardiovascular: Normal rate, regular rhythm. Good peripheral circulation. Grossly normal heart sounds.   Respiratory: Normal respiratory effort.  No retractions. Lungs CTAB. Gastrointestinal: Soft and nontender. No distention.  Musculoskeletal: Complete wound dehiscence of the left lower extremity approximately 18 cm across.  No surrounding cellulitis or purulence.  No visible bone.  Neurologic:  Normal speech and language. No gross focal neurologic deficits are appreciated.  Skin: Wound dehiscence as above.  ____________________________________________   LABS (all labs ordered are listed, but only abnormal  results are displayed)  Labs Reviewed  COMPREHENSIVE METABOLIC PANEL - Abnormal; Notable for the following components:      Result Value   Glucose, Bld 224 (*)    BUN 30 (*)    Creatinine, Ser 2.31 (*)    Calcium 8.4 (*)    Total Protein 6.2 (*)    Albumin 2.3 (*)    AST 9 (*)    GFR calc non Af Amer 25 (*)    GFR calc Af Amer 29 (*)    All other components within normal limits  CBC WITH DIFFERENTIAL/PLATELET - Abnormal; Notable for the following components:   RBC 2.83 (*)    Hemoglobin 7.3 (*)    HCT 24.4 (*)    MCH 25.8 (*)    MCHC 29.9 (*)    All other components within normal limits  BASIC METABOLIC PANEL - Abnormal; Notable for the following components:   CO2 20 (*)    Glucose, Bld 151 (*)    BUN 31 (*)    Creatinine, Ser 2.16 (*)    Calcium 8.3 (*)    GFR calc non Af Amer 27 (*)    GFR calc Af Amer 31 (*)    All other components within normal limits  CBC - Abnormal; Notable for the following components:   RBC 2.91 (*)    Hemoglobin 7.8 (*)    HCT 26.1 (*)    MCHC 29.9 (*)    All other components within normal limits  CBG MONITORING, ED - Abnormal; Notable for the following components:   Glucose-Capillary 133 (*)    All other components within normal limits  CBG MONITORING, ED - Abnormal; Notable for the following components:   Glucose-Capillary 178 (*)    All other components within normal limits  RESPIRATORY PANEL BY RT PCR (FLU A&B, COVID)  CBC  BASIC METABOLIC PANEL   ____________________________________________  RADIOLOGY  DG Knee 2 Views Left  Result Date: 09/18/2019 CLINICAL DATA:  Patient with wound to the amputation site. EXAM: LEFT KNEE - 1-2 VIEW COMPARISON:  None. FINDINGS: Soft tissue overlying the distal aspect of the amputated tibia and fibula contains gas. No evidence for acute fracture or dislocation. Knee joint degenerative changes. Amputation of the distal aspect of the tibia and fibula. IMPRESSION: Gas within the soft tissues overlying the  distal aspect of the tibia and fibula most compatible with reported injury. No definite evidence for acute fracture. Electronically Signed   By: Lovey Newcomer M.D.   On: 09/18/2019 18:27    ____________________________________________   PROCEDURES  Procedure(s) performed:   Procedures  None  ____________________________________________   INITIAL IMPRESSION / ASSESSMENT AND PLAN / ED COURSE  Pertinent labs & imaging results that were available during my care of the patient were reviewed by me and considered in my medical decision making (see chart for details).   Patient presents emergency department with wound dehiscence of the left lower extremity after fall.  Plan for plain film of the leg along with lab work and Covid test.  Spoke with Dr. Erlinda Hong to review the case.  He advises that we start Ancef and wrapped in a dry dressing.  He will make Dr. Sharol Given aware who will consult in the morning.  Patient will need to go for washout and repair.  We will try and squeeze her into the OR schedule tomorrow but if are unable to do so will get her in on Friday.   Discussed patient's case with Medicine service to request admission. Patient and family (if present) updated with plan. Care transferred to Medicine service.  I reviewed all nursing notes, vitals, pertinent old records, EKGs, labs, imaging (as available).  ____________________________________________  FINAL CLINICAL IMPRESSION(S) / ED DIAGNOSES  Final diagnoses:  Wound dehiscence  Fall, initial encounter     MEDICATIONS GIVEN DURING THIS VISIT:  Medications  nicotine (NICODERM CQ - dosed in mg/24 hours) patch 14 mg (14 mg Transdermal Patch Applied 09/19/19 1425)  acetaminophen (TYLENOL) tablet 650 mg (has no administration in time range)    Or  acetaminophen (TYLENOL) suppository 650 mg (has no administration in time range)  amitriptyline (ELAVIL) tablet 50 mg (50 mg Oral Given 09/19/19 0224)  cyclobenzaprine (FLEXERIL) tablet 5 mg  (has no administration in time range)  atorvastatin (LIPITOR) tablet 20 mg (20 mg Oral Given 09/19/19 1418)  FLUoxetine (PROZAC) capsule 60 mg (60 mg Oral Given 09/19/19 1420)  amLODipine (NORVASC) tablet 10 mg (10 mg Oral Given 09/19/19 1421)  bumetanide (BUMEX) tablet 2 mg (has no administration in time range)  lisinopril (ZESTRIL) tablet 10 mg (has no administration in time range)  metoprolol tartrate (LOPRESSOR) tablet 25 mg (25 mg Oral Given 09/19/19 1422)  pregabalin (LYRICA) capsule 50 mg (50 mg Oral Given 09/19/19 1419)  oxyCODONE (Oxy IR/ROXICODONE) immediate release tablet 5-10 mg (10 mg Oral Given 09/19/19 0746)  insulin aspart (novoLOG) injection 0-15 Units (3 Units Subcutaneous Given 09/19/19 1417)  bumetanide (BUMEX) tablet 1 mg (has no administration in time range)  HYDROmorphone (DILAUDID) injection 0.5 mg (0.5 mg Intravenous Given 09/19/19 0938)  ceFAZolin (ANCEF) IVPB 2g/100 mL premix (0 g Intravenous Stopped 09/19/19 1652)  morphine 4 MG/ML injection 4 mg (4 mg Intravenous Given 09/18/19 1921)  ondansetron (ZOFRAN) injection 4 mg (4 mg Intravenous Given 09/18/19 1921)  ceFAZolin (ANCEF) IVPB 1 g/50 mL premix (0 g Intravenous Stopped 09/18/19 1956)    Note:  This document was prepared using Dragon voice recognition software and may include unintentional dictation errors.  Nanda Quinton, MD, [facility] Emergency Medicine    Jalan Fariss, [facility], MD 09/19/19 [phone]

## 2019-09-18 NOTE — ED Notes (Signed)
Paging admitting/coverage to find out why pt is NPO

## 2019-09-18 NOTE — ED Triage Notes (Signed)
To ED for treatment after opening her left aka surgical site. Pt states she was walking with crutches and fell over a curb. Skin completely open. Bandaged at triage to help stop bleeding. Dr Sharol Given performed surgery 2 wks ago

## 2019-09-19 ENCOUNTER — Ambulatory Visit: Payer: Medicare HMO | Admitting: Orthopedic Surgery

## 2019-09-19 ENCOUNTER — Other Ambulatory Visit: Payer: Self-pay

## 2019-09-19 ENCOUNTER — Other Ambulatory Visit: Payer: Self-pay | Admitting: Physician Assistant

## 2019-09-19 LAB — BASIC METABOLIC PANEL
Anion gap: 9 (ref 5–15)
BUN: 31 mg/dL — ABNORMAL HIGH (ref 6–20)
CO2: 20 mmol/L — ABNORMAL LOW (ref 22–32)
Calcium: 8.3 mg/dL — ABNORMAL LOW (ref 8.9–10.3)
Chloride: 108 mmol/L (ref 98–111)
Creatinine, Ser: 2.16 mg/dL — ABNORMAL HIGH (ref 0.44–1.00)
GFR calc Af Amer: 31 mL/min — ABNORMAL LOW (ref >60)
GFR calc non Af Amer: 27 mL/min — ABNORMAL LOW (ref >60)
Glucose, Bld: 151 mg/dL — ABNORMAL HIGH (ref 70–99)
Potassium: 4.7 mmol/L (ref 3.5–5.1)
Sodium: 137 mmol/L (ref 135–145)

## 2019-09-19 LAB — CBC
HCT: 26.1 % — ABNORMAL LOW (ref 36.0–46.0)
Hemoglobin: 7.8 g/dL — ABNORMAL LOW (ref 12.0–15.0)
MCH: 26.8 pg (ref 26.0–34.0)
MCHC: 29.9 g/dL — ABNORMAL LOW (ref 30.0–36.0)
MCV: 89.7 fL (ref 80.0–100.0)
Platelets: 250 10*3/uL (ref 150–400)
RBC: 2.91 MIL/uL — ABNORMAL LOW (ref 3.87–5.11)
RDW: 14.8 % (ref 11.5–15.5)
WBC: 5.4 10*3/uL (ref 4.0–10.5)
nRBC: 0 % (ref 0.0–0.2)

## 2019-09-19 LAB — CBG MONITORING, ED
Glucose-Capillary: 133 mg/dL — ABNORMAL HIGH (ref 70–99)
Glucose-Capillary: 178 mg/dL — ABNORMAL HIGH (ref 70–99)

## 2019-09-19 LAB — SURGICAL PCR SCREEN
MRSA, PCR: NEGATIVE
Staphylococcus aureus: POSITIVE — AB

## 2019-09-19 LAB — GLUCOSE, CAPILLARY
Glucose-Capillary: 164 mg/dL — ABNORMAL HIGH (ref 70–99)
Glucose-Capillary: 196 mg/dL — ABNORMAL HIGH (ref 70–99)

## 2019-09-19 MED ORDER — HYDROMORPHONE HCL 1 MG/ML IJ SOLN
0.5000 mg | INTRAMUSCULAR | Status: DC | PRN
  Administered 2019-09-19 – 2019-09-22 (×8): 0.5 mg via INTRAVENOUS
  Filled 2019-09-19 (×8): qty 1

## 2019-09-19 MED ORDER — CEFAZOLIN SODIUM-DEXTROSE 1-4 GM/50ML-% IV SOLN: 1.0000 g | Freq: Three times a day (TID) | INTRAVENOUS | Status: DC

## 2019-09-19 MED ORDER — CEFAZOLIN SODIUM-DEXTROSE 2-4 GM/100ML-% IV SOLN
2.0000 g | Freq: Three times a day (TID) | INTRAVENOUS | Status: AC
  Administered 2019-09-19 – 2019-09-21 (×8): 2 g via INTRAVENOUS
  Filled 2019-09-19 (×7): qty 100

## 2019-09-19 NOTE — ED Notes (Signed)
Patient c/o severe pain, stated PO pain pill given earlier with no relief.

## 2019-09-19 NOTE — Plan of Care (Signed)

## 2019-09-19 NOTE — ED Notes (Signed)
Patient medicated with current PO PRN pain meds, informed patient awaiting new orders, if any for better pain control, otherwise VSS, no obvious S/S of distress.

## 2019-09-19 NOTE — Progress Notes (Signed)
   Subjective: [name] [name] was seen at bedside this AM. She reports that her pain has been the same since her admission. She also c/o fatigue. Denies fever, chills, CP, SOB. [name] [name] states that she mainly uses a prosthesis for ambulation but also uses a walker. She states that she was using crutches at the time that she fell on her stump yesterday. She has not needed to use a wheelchair for the past two years. She denies h/o MI within the past 6 months. No CP or COB when performing her daily activities. There are no other complaints or concerns at this time.  Objective:  Vital signs in last 24 hours: Vitals:   09/19/19 0630 09/19/19 0645 09/19/19 0745 09/19/19 0910  BP: 106/70 121/78 124/76   Pulse: 64 64 74   Resp: 18 18 17    Temp:    97.8 F (36.6 C)  SpO2: 95% 98% 100%    Weight change:   Intake/Output Summary (Last 24 hours) at 09/19/2019 1101 Last data filed at 09/18/2019 1956 Gross per 24 hour  Intake 45.25 ml  Output -  Net 45.25 ml   Physical Exam Constitutional: well-developed, well-nourished. NAD. Sitting comfortably in bed.  Cardiovascular: RRR. No rubs or gallops.  Pulmonary/Chest: CTAB, no wheezes, rales, or rhonchi.  Musculoskeletal: Left BKA.  Neurological: A&Ox3, CN II - XII grossly intact.  Skin: Warm and dry. No pitting edema in BLE. Left BKA surgical site with wound dehiscence. Sutures are not intact. Observed clotted blood underneath with visualization of subcutaneous fat. No surrounding erythema, warmth. No purulence.  Psychiatric: Normal mood and affect   Assessment/Plan:  [name] [name] is a 44 y.o. female w/ a PMHx of left leg osteomyelitis s/p BKA in 2018,  s/p two BKA revisions (in 2019 and most recently April 2021), HTN, HLD, fibromyalgia, DM, HFpEF, anemia of chronic disease and hyperthyroidism with wound dehiscence after undergoing trauma to her stump on 09/18/19; she is currently on hospital day 2 and is stable.   Left leg osteomyelitis  s/p BKA: Wound dehiscence: Clinical findings are consistent with wound dehiscence after trauma to her stump yesterday. S/p IV Cefazolin in ED yesterday. She has been afebrile and without elevations in her WBC. On exam, there is wound dehiscence, but her stump does not appear to be acutely infected at this time. Orthopedics is on board and is planning for revision of the below-knee amputation versus above-the-knee amputation. - Ortho is on board; appreciate their recs.  - NPO after midnight for BKA revision versus AKA tomorrow. - Pain control with tylenol, oxycodone, IV dilaudid - Trend fever curve/monitor WBC count  HTN: - Continue home amlodipine, bumetanide, metoprolol and lisinopril  HLD: - Continue home lipitor  Fibromyalgia: - Continue home amitriptyline, oxycodone, flexeril and lyrica  DM: - SSI  HFpEF: - Continue home BP meds as above  Anemia of Chronic Disease: Hgb 7.3 on presentation, down from 8-8.4 a few weeks ago. This could be due to acute blood loss from her wound dehiscence as above. We will continue to trend Hgb. - Daily CBC  Anxiety: - Continue home prozac   Tobacco Use: Patient is a current smoker with an eight-pack-year history who is interested in quitting smoking. - Nicotine replacement - Will need smoking cessation assistance on discharge  FEN/GI: - Heart healthy/carb modified - NPO at midnight   Active Problems:   Wound dehiscence    LOS: 1 day   Orvis Brill, Medical Student 09/19/2019, 11:01 AM

## 2019-09-19 NOTE — ED Notes (Signed)
Breakfast ordered 

## 2019-09-19 NOTE — ED Notes (Signed)
Lunch Tray Ordered @ 1119. 

## 2019-09-19 NOTE — Progress Notes (Signed)
Patient ID: [name] [name], female   DOB: Nov 03, 1975, 44 y.o.   MRN: [id number] Full note to follow.  Patient with dehiscence of her revision left transtibial amputation secondary to fall.  We will plan for surgery tomorrow.  N.p.o. after midnight tonight.  Plan for revision of the below-knee amputation versus above-the-knee amputation.

## 2019-09-20 ENCOUNTER — Inpatient Hospital Stay (HOSPITAL_COMMUNITY): Payer: Medicare HMO | Admitting: Anesthesiology

## 2019-09-20 ENCOUNTER — Encounter (HOSPITAL_COMMUNITY): Payer: Self-pay | Admitting: Internal Medicine

## 2019-09-20 ENCOUNTER — Encounter (HOSPITAL_COMMUNITY)
Admission: EM | Disposition: A | Discharge status: Home Health | Payer: Medicare HMO | Source: Home / Self Care | Attending: Internal Medicine

## 2019-09-20 DIAGNOSIS — E1142 Type 2 diabetes mellitus with diabetic polyneuropathy: Secondary | ICD-10-CM | POA: Diagnosis not present

## 2019-09-20 DIAGNOSIS — T8781 Dehiscence of amputation stump: Secondary | ICD-10-CM | POA: Diagnosis not present

## 2019-09-20 DIAGNOSIS — T8130XA Disruption of wound, unspecified, initial encounter: Secondary | ICD-10-CM | POA: Diagnosis not present

## 2019-09-20 DIAGNOSIS — W19XXXA Unspecified fall, initial encounter: Secondary | ICD-10-CM | POA: Diagnosis not present

## 2019-09-20 HISTORY — PX: STUMP REVISION: SHX6102

## 2019-09-20 LAB — BASIC METABOLIC PANEL
Anion gap: 7 (ref 5–15)
BUN: 39 mg/dL — ABNORMAL HIGH (ref 6–20)
CO2: 21 mmol/L — ABNORMAL LOW (ref 22–32)
Calcium: 7.8 mg/dL — ABNORMAL LOW (ref 8.9–10.3)
Chloride: 104 mmol/L (ref 98–111)
Creatinine, Ser: 2.44 mg/dL — ABNORMAL HIGH (ref 0.44–1.00)
GFR calc Af Amer: 27 mL/min — ABNORMAL LOW (ref >60)
GFR calc non Af Amer: 23 mL/min — ABNORMAL LOW (ref >60)
Glucose, Bld: 227 mg/dL — ABNORMAL HIGH (ref 70–99)
Potassium: 5.2 mmol/L — ABNORMAL HIGH (ref 3.5–5.1)
Sodium: 132 mmol/L — ABNORMAL LOW (ref 135–145)

## 2019-09-20 LAB — CBC
HCT: 22.7 % — ABNORMAL LOW (ref 36.0–46.0)
Hemoglobin: 6.9 g/dL — CL (ref 12.0–15.0)
MCH: 26.1 pg (ref 26.0–34.0)
MCHC: 30.4 g/dL (ref 30.0–36.0)
MCV: 86 fL (ref 80.0–100.0)
Platelets: 239 10*3/uL (ref 150–400)
RBC: 2.64 MIL/uL — ABNORMAL LOW (ref 3.87–5.11)
RDW: 14.7 % (ref 11.5–15.5)
WBC: 5.6 10*3/uL (ref 4.0–10.5)
nRBC: 0 % (ref 0.0–0.2)

## 2019-09-20 LAB — PREPARE RBC (CROSSMATCH)

## 2019-09-20 LAB — GLUCOSE, CAPILLARY
Glucose-Capillary: 171 mg/dL — ABNORMAL HIGH (ref 70–99)
Glucose-Capillary: 81 mg/dL (ref 70–99)
Glucose-Capillary: 83 mg/dL (ref 70–99)
Glucose-Capillary: 80 mg/dL (ref 70–99)
Glucose-Capillary: 103 mg/dL — ABNORMAL HIGH (ref 70–99)
Glucose-Capillary: 502 mg/dL (ref 70–99)

## 2019-09-20 LAB — HEMOGLOBIN AND HEMATOCRIT, BLOOD
HCT: 25.7 % — ABNORMAL LOW (ref 36.0–46.0)
Hemoglobin: 7.9 g/dL — ABNORMAL LOW (ref 12.0–15.0)

## 2019-09-20 SURGERY — REVISION, AMPUTATION SITE
Anesthesia: General | Laterality: Left

## 2019-09-20 MED ORDER — FENTANYL CITRATE (PF) 250 MCG/5ML IJ SOLN
INTRAMUSCULAR | Status: AC
  Filled 2019-09-20: qty 5

## 2019-09-20 MED ORDER — EPHEDRINE SULFATE 50 MG/ML IJ SOLN
INTRAMUSCULAR | Status: DC | PRN
  Administered 2019-09-20 (×2): 5 mg via INTRAVENOUS

## 2019-09-20 MED ORDER — ACETAMINOPHEN 10 MG/ML IV SOLN
1000.0000 mg | Freq: Once | INTRAVENOUS | Status: AC
  Administered 2019-09-20: 1000 mg via INTRAVENOUS

## 2019-09-20 MED ORDER — ACETAMINOPHEN 10 MG/ML IV SOLN
INTRAVENOUS | Status: AC
  Filled 2019-09-20: qty 100

## 2019-09-20 MED ORDER — KETOROLAC TROMETHAMINE 30 MG/ML IJ SOLN
30.0000 mg | Freq: Once | INTRAMUSCULAR | Status: AC
  Administered 2019-09-20: 30 mg via INTRAVENOUS

## 2019-09-20 MED ORDER — DEXAMETHASONE SODIUM PHOSPHATE 4 MG/ML IJ SOLN
INTRAMUSCULAR | Status: DC | PRN
  Administered 2019-09-20: 5 mg via INTRAVENOUS

## 2019-09-20 MED ORDER — FENTANYL CITRATE (PF) 100 MCG/2ML IJ SOLN
INTRAMUSCULAR | Status: DC | PRN
  Administered 2019-09-20 (×2): 25 ug via INTRAVENOUS

## 2019-09-20 MED ORDER — INSULIN ASPART 100 UNIT/ML ~~LOC~~ SOLN
20.0000 [IU] | Freq: Once | SUBCUTANEOUS | Status: AC
  Administered 2019-09-20: 20 [IU] via SUBCUTANEOUS

## 2019-09-20 MED ORDER — FENTANYL CITRATE (PF) 100 MCG/2ML IJ SOLN
INTRAMUSCULAR | Status: AC
  Filled 2019-09-20: qty 2

## 2019-09-20 MED ORDER — ONDANSETRON HCL 4 MG/2ML IJ SOLN
INTRAMUSCULAR | Status: AC
  Filled 2019-09-20: qty 2

## 2019-09-20 MED ORDER — SUCCINYLCHOLINE CHLORIDE 200 MG/10ML IV SOSY
PREFILLED_SYRINGE | INTRAVENOUS | Status: AC
  Filled 2019-09-20: qty 10

## 2019-09-20 MED ORDER — KETOROLAC TROMETHAMINE 30 MG/ML IJ SOLN
INTRAMUSCULAR | Status: AC
  Filled 2019-09-20: qty 1

## 2019-09-20 MED ORDER — PHENYLEPHRINE 40 MCG/ML (10ML) SYRINGE FOR IV PUSH (FOR BLOOD PRESSURE SUPPORT)
PREFILLED_SYRINGE | INTRAVENOUS | Status: AC
  Filled 2019-09-20: qty 30

## 2019-09-20 MED ORDER — ONDANSETRON HCL 4 MG PO TABS: 4.0000 mg | ORAL_TABLET | Freq: Four times a day (QID) | ORAL | Status: DC | PRN

## 2019-09-20 MED ORDER — SODIUM CHLORIDE 0.9% IV SOLUTION: Freq: Once | INTRAVENOUS | Status: DC

## 2019-09-20 MED ORDER — MIDAZOLAM HCL 2 MG/2ML IJ SOLN
INTRAMUSCULAR | Status: AC
  Filled 2019-09-20: qty 2

## 2019-09-20 MED ORDER — PHENYLEPHRINE HCL (PRESSORS) 10 MG/ML IV SOLN
INTRAVENOUS | Status: DC | PRN
  Administered 2019-09-20 (×3): 80 ug via INTRAVENOUS

## 2019-09-20 MED ORDER — OXYCODONE HCL 5 MG/5ML PO SOLN: 5.0000 mg | Freq: Once | ORAL | Status: DC | PRN

## 2019-09-20 MED ORDER — ONDANSETRON HCL 4 MG/2ML IJ SOLN: 4.0000 mg | Freq: Four times a day (QID) | INTRAMUSCULAR | Status: DC | PRN

## 2019-09-20 MED ORDER — SODIUM CHLORIDE 0.9 % IV SOLN
INTRAVENOUS | Status: DC | PRN
Start: 2019-09-20 — End: 2019-09-20

## 2019-09-20 MED ORDER — EPHEDRINE 5 MG/ML INJ
INTRAVENOUS | Status: AC
  Filled 2019-09-20: qty 10

## 2019-09-20 MED ORDER — DOCUSATE SODIUM 100 MG PO CAPS
100.0000 mg | ORAL_CAPSULE | Freq: Two times a day (BID) | ORAL | Status: DC
  Administered 2019-09-20 – 2019-09-26 (×12): 100 mg via ORAL
  Filled 2019-09-20 (×12): qty 1

## 2019-09-20 MED ORDER — PROPOFOL 10 MG/ML IV BOLUS
INTRAVENOUS | Status: DC | PRN
  Administered 2019-09-20: 120 mg via INTRAVENOUS

## 2019-09-20 MED ORDER — CEFAZOLIN SODIUM 1 G IJ SOLR
INTRAMUSCULAR | Status: AC
  Filled 2019-09-20: qty 20

## 2019-09-20 MED ORDER — FENTANYL CITRATE (PF) 100 MCG/2ML IJ SOLN
25.0000 ug | INTRAMUSCULAR | Status: DC | PRN
  Administered 2019-09-20 (×2): 50 ug via INTRAVENOUS

## 2019-09-20 MED ORDER — DEXAMETHASONE SODIUM PHOSPHATE 10 MG/ML IJ SOLN
INTRAMUSCULAR | Status: AC
  Filled 2019-09-20: qty 1

## 2019-09-20 MED ORDER — SODIUM CHLORIDE 0.9 % IV SOLN: INTRAVENOUS | Status: AC

## 2019-09-20 MED ORDER — ONDANSETRON HCL 4 MG/2ML IJ SOLN
INTRAMUSCULAR | Status: DC | PRN
  Administered 2019-09-20: 4 mg via INTRAVENOUS

## 2019-09-20 MED ORDER — ONDANSETRON HCL 4 MG/2ML IJ SOLN: 4.0000 mg | Freq: Once | INTRAMUSCULAR | Status: DC | PRN

## 2019-09-20 MED ORDER — 0.9 % SODIUM CHLORIDE (POUR BTL) OPTIME
TOPICAL | Status: DC | PRN
  Administered 2019-09-20: 14:00:00 1000 mL

## 2019-09-20 MED ORDER — LIDOCAINE 2% (20 MG/ML) 5 ML SYRINGE
INTRAMUSCULAR | Status: AC
  Filled 2019-09-20: qty 5

## 2019-09-20 MED ORDER — OXYCODONE HCL 5 MG PO TABS: 5.0000 mg | ORAL_TABLET | Freq: Once | ORAL | Status: DC | PRN

## 2019-09-20 SURGICAL SUPPLY — 31 items
BLADE SAW RECIP 87.9 MT (BLADE) IMPLANT
BLADE SURG 21 STRL SS (BLADE) ×3 IMPLANT
CANISTER WOUND CARE 500ML ATS (WOUND CARE) ×3 IMPLANT
COVER SURGICAL LIGHT HANDLE (MISCELLANEOUS) ×3 IMPLANT
COVER WAND RF STERILE (DRAPES) ×1 IMPLANT
DRAPE EXTREMITY T 121X128X90 (DISPOSABLE) ×3 IMPLANT
DRAPE HALF SHEET 40X57 (DRAPES) ×3 IMPLANT
DRAPE INCISE IOBAN 66X45 STRL (DRAPES) ×3 IMPLANT
DRAPE U-SHAPE 47X51 STRL (DRAPES) ×6 IMPLANT
DRESSING PREVENA PLUS CUSTOM (GAUZE/BANDAGES/DRESSINGS) ×1 IMPLANT
DRSG PREVENA PLUS CUSTOM (GAUZE/BANDAGES/DRESSINGS) ×3
DURAPREP 26ML APPLICATOR (WOUND CARE) ×3 IMPLANT
ELECT REM PT RETURN 9FT ADLT (ELECTROSURGICAL) ×3
ELECTRODE REM PT RTRN 9FT ADLT (ELECTROSURGICAL) ×1 IMPLANT
GLOVE BIOGEL PI IND STRL 9 (GLOVE) ×1 IMPLANT
GLOVE BIOGEL PI INDICATOR 9 (GLOVE) ×2
GLOVE SURG ORTHO 9.0 STRL STRW (GLOVE) ×3 IMPLANT
GOWN STRL REUS W/ TWL XL LVL3 (GOWN DISPOSABLE) ×2 IMPLANT
GOWN STRL REUS W/TWL XL LVL3 (GOWN DISPOSABLE) ×6
KIT BASIN OR (CUSTOM PROCEDURE TRAY) ×3 IMPLANT
KIT TURNOVER KIT B (KITS) ×3 IMPLANT
MANIFOLD NEPTUNE II (INSTRUMENTS) ×3 IMPLANT
NS IRRIG 1000ML POUR BTL (IV SOLUTION) ×3 IMPLANT
PACK GENERAL/GYN (CUSTOM PROCEDURE TRAY) ×3 IMPLANT
PAD ARMBOARD 7.5X6 YLW CONV (MISCELLANEOUS) ×3 IMPLANT
PREVENA RESTOR ARTHOFORM 46X30 (CANNISTER) ×3 IMPLANT
STAPLER VISISTAT 35W (STAPLE) IMPLANT
SUT ETHILON 2 0 PSLX (SUTURE) ×8 IMPLANT
SUT SILK 2 0 (SUTURE)
SUT SILK 2-0 18XBRD TIE 12 (SUTURE) IMPLANT
TOWEL GREEN STERILE (TOWEL DISPOSABLE) ×3 IMPLANT

## 2019-09-20 NOTE — Plan of Care (Signed)
  Problem: Education: Goal: Knowledge of General Education information will improve Description: Including pain rating scale, medication(s)/side effects and non-pharmacologic comfort measures 09/20/2019 0803 by Melina Schools, RN Outcome: Progressing 09/20/2019 0802 by Melina Schools, RN Outcome: Progressing

## 2019-09-20 NOTE — Plan of Care (Signed)
  Problem: Education: Goal: Knowledge of General Education information will improve Description: Including pain rating scale, medication(s)/side effects and non-pharmacologic comfort measures 09/20/2019 2141 by Guinevere Scarlet, RN Outcome: Not Progressing 09/20/2019 2141 by Guinevere Scarlet, RN Outcome: Not Progressing   Problem: Health Behavior/Discharge Planning: Goal: Ability to manage health-related needs will improve 09/20/2019 2141 by Guinevere Scarlet, RN Outcome: Not Progressing 09/20/2019 2141 by Guinevere Scarlet, RN Outcome: Not Progressing   Problem: Clinical Measurements: Goal: Ability to maintain clinical measurements within normal limits will improve 09/20/2019 2141 by Guinevere Scarlet, RN Outcome: Not Progressing 09/20/2019 2141 by Guinevere Scarlet, RN Outcome: Not Progressing

## 2019-09-20 NOTE — Plan of Care (Signed)
  Problem: Education: Goal: Knowledge of General Education information will improve Description Including pain rating scale, medication(s)/side effects and non-pharmacologic comfort measures Outcome: Progressing   

## 2019-09-20 NOTE — Progress Notes (Signed)
CRITICAL VALUE ALERT  Critical Value:  Hgb 6.9  Date & Time Notied:  09/20/19 @ 8280  Provider Notified: yes Al Decant)  Orders Received/Actions taken:  Type and screen stat ordered, waiting for further instructions

## 2019-09-20 NOTE — Consult Note (Addendum)
ORTHOPAEDIC CONSULTATION  REQUESTING PHYSICIAN: Sid Falcon, MD  Chief Complaint: Dehiscence left transtibial amputation.  HPI: [name] [name] is a 44 y.o. female who presents with acute wound dehiscence left transtibial amputation.  Patient states she was at home lost her balance and fell on her residual limb sustaining a traumatic dehiscence.  Patient is status post multiple surgical interventions for her transtibial amputation.  Past Medical History:  Diagnosis Date  . Abdominal muscle pain 09/08/2016  . Abnormal Pap smear of cervix 2009  . Abscess    history of multiple abscesses  . Acute bilateral low back pain 02/13/2017  . Acute blood loss anemia   . Acute renal failure ([place]) 07/12/2012  . AKI (acute kidney injury) ([place])   . Anemia of chronic disease 2002  . Anxiety    Panic attacks  . Bilateral lower extremity edema 05/13/2016  . Bipolar disorder ([place])   . Cellulitis 05/21/2014   right eye  . Chronic bronchitis ([place])    "get it q yr" (05/13/2013)  . Chronic pain   . Depression   . Edema of lower extremity   . Endocarditis 2002   subacute bacterial endocarditis.   . Family history of anesthesia complication    "my mom has a hard time coming out from under"  . Fibromyalgia   . GERD (gastroesophageal reflux disease)    occ  . Heart murmur   . Herpes simplex type 1 infection 01/16/2018  . History of blood transfusion    "just low blood count" (05/13/2013)  . Hyperlipidemia   . Hypertension   . Hypothyroidism   . Hypothyroidism, adult 03/21/2014  . Leukocytosis   . Necrosis ([place])    and ulceration  . Necrotizing fasciitis s/p OR debridements 07/06/2012  . Obesity   . OSA on CPAP    does not wear all the time  . Peripheral neuropathy   . Type II diabetes mellitus ([place])    Type  II   Past Surgical History:  Procedure Laterality Date  . ABDOMINAL AORTOGRAM W/LOWER EXTREMITY Left 03/19/2019   Procedure: ABDOMINAL AORTOGRAM W/LOWER EXTREMITY;   Surgeon: Serafina Mitchell, MD;  Location: [facility];  Service: Cardiovascular;  Laterality: Left;  . AMPUTATION Left 04/07/2017   Procedure: LEFT BELOW KNEE AMPUTATION;  Surgeon: Newt Minion, MD;  Location: [facility];  Service: Orthopedics;  Laterality: Left;  . EYE SURGERY     [place]  . INCISION AND DRAINAGE ABSCESS     multiple I&Ds  . INCISION AND DRAINAGE ABSCESS Left 07/09/2012   Procedure: DRESSING CHANGE, THIGH WOUND;  Surgeon: Harl Bowie, MD;  Location: [facility];  Service: General;  Laterality: Left;  . INCISION AND DRAINAGE OF WOUND Left 07/07/2012   Procedure: IRRIGATION AND DEBRIDEMENT WOUND;  Surgeon: Harl Bowie, MD;  Location: [facility];  Service: General;  Laterality: Left;  . INCISION AND DRAINAGE PERIRECTAL ABSCESS Left 07/14/2012   Procedure: DEBRIDEMENT OF SKIN & SOFT TISSUE; DRESSING CHANGE UNDER ANESTHESIA;  Surgeon: Gayland Curry, MD,FACS;  Location: [facility];  Service: General;  Laterality: Left;  . INCISION AND DRAINAGE PERIRECTAL ABSCESS Left 07/16/2012   Procedure: I&D Left Thigh;  Surgeon: Gwenyth Ober, MD;  Location: [facility];  Service: General;  Laterality: Left;  . INCISION AND DRAINAGE PERIRECTAL ABSCESS N/A 01/05/2015   Procedure: IRRIGATION AND DEBRIDEMENT PERIRECTAL ABSCESS;  Surgeon: Donnie Mesa, MD;  Location: [facility];  Service: General;  Laterality: N/A;  . IRRIGATION AND  DEBRIDEMENT ABSCESS Left 07/06/2012   Procedure: IRRIGATION AND DEBRIDEMENT ABSCESS BUTTOCKS AND THIGH;  Surgeon: Shann Medal, MD;  Location: [facility];  Service: General;  Laterality: Left;  . IRRIGATION AND DEBRIDEMENT ABSCESS Left 08/10/2012   Procedure: IRRIGATION AND DEBRIDEMENT ABSCESS;  Surgeon: Madilyn Hook, DO;  Location: [facility];  Service: General;  Laterality: Left;  . STUMP REVISION Left 06/09/2017   Procedure: REVISION LEFT BELOW KNEE AMPUTATION;  Surgeon: Newt Minion, MD;  Location: [facility];  Service: Orthopedics;  Laterality: Left;  . STUMP REVISION Left 09/04/2019    Procedure: LEFT BELOW KNEE AMPUTATION REVISION;  Surgeon: Newt Minion, MD;  Location: [facility];  Service: Orthopedics;  Laterality: Left;  . TONSILLECTOMY  1994   Social History   Socioeconomic History  . Marital status: Single    Spouse name: Not on file  . Number of children: 0  . Years of education: 29  . Highest education level: Not on file  Occupational History  . Occupation: disability  Tobacco Use  . Smoking status: Current Every Day Smoker    Packs/day: 0.50    Years: 16.00    Pack years: 8.00    Types: Cigarettes  . Smokeless tobacco: Never Used  . Tobacco comment: 1 PPD  Substance and Sexual Activity  . Alcohol use: Yes    Alcohol/week: 0.0 standard drinks    Comment: Socially-" monthly  maybe"  . Drug use: No  . Sexual activity: Yes  Other Topics Concern  . Not on file  Social History Narrative   Lives with mother currently, goddaughter and uncle in a one story home.     On disability for diabetes, neuropathy, sleep apnea etc.  Disability since 2014.    Education: 11th grade.    Social Determinants of Health   Financial Resource Strain:   . Difficulty of Paying Living Expenses:   Food Insecurity:   . Worried About Charity fundraiser in the Last Year:   . Arboriculturist in the Last Year:   Transportation Needs:   . Film/video editor (Medical):   Marland Kitchen Lack of Transportation (Non-Medical):   Physical Activity:   . Days of Exercise per Week:   . Minutes of Exercise per Session:   Stress:   . Feeling of Stress :   Social Connections:   . Frequency of Communication with Friends and Family:   . Frequency of Social Gatherings with Friends and Family:   . Attends Religious Services:   . Active Member of Clubs or Organizations:   . Attends Archivist Meetings:   Marland Kitchen Marital Status:    Family History  Problem Relation Age of Onset  . Heart failure Mother   . Diabetes Mother   . Kidney disease Mother   . Kidney disease Father   . Diabetes  Father   . Diabetes Paternal Grandmother   . Heart failure Paternal Grandmother   . Other Other    - negative except otherwise stated in the family history section Allergies  Allergen Reactions  . Vancomycin Other (See Comments)    Acute renal failure suspected secondary to vanco   Prior to Admission medications   Medication Sig Start Date End Date Taking? Authorizing Provider  allopurinol (ZYLOPRIM) 100 MG tablet Take 1 tablet (100 mg total) by mouth 2 (two) times daily. 04/16/19  Yes Newt Minion, MD  amitriptyline (ELAVIL) 50 MG tablet Take 1 tablet (50 mg total) by mouth at bedtime. 09/10/19  Yes Helberg, Justin, MD  amLODipine (NORVASC) 10 MG tablet TAKE 1 TABLET BY MOUTH EVERY DAY Patient taking differently: Take 10 mg by mouth daily.  07/22/19  Yes Helberg, Larkin Ina, MD  atorvastatin (LIPITOR) 20 MG tablet Take 1 tablet (20 mg total) by mouth daily. 09/11/19  Yes Helberg, Larkin Ina, MD  bumetanide (BUMEX) 1 MG tablet TAKE 1 TABLET BY MOUTH 2 TIMES DAILY Patient taking differently: Take 1 mg by mouth See admin instructions. Takes 2 mg in the morning and 1 mg in the afternoon. 08/19/19  Yes Helberg, Larkin Ina, MD  Colchicine 0.6 MG CAPS Take 0.6 mg by mouth daily. Patient taking differently: Take 0.6 mg by mouth as needed (gout).  04/16/19  Yes Newt Minion, MD  cyclobenzaprine (FLEXERIL) 5 MG tablet Take 1 tablet (5 mg total) by mouth daily as needed for muscle spasms. 09/06/19  Yes Helberg, Larkin Ina, MD  diclofenac Sodium (VOLTAREN) 1 % GEL Apply 1 application topically 4 (four) times daily as needed (pain). 06/26/19  Yes Helberg, Larkin Ina, MD  FLUoxetine (PROZAC) 20 MG/5ML solution Take 15 mLs (60 mg total) by mouth daily. 06/29/18 09/18/19 Yes Helberg, Larkin Ina, MD  hydrOXYzine (ATARAX/VISTARIL) 25 MG tablet TAKE 1 TABLET BY MOUTH EVERY SIX HOURS AS NEEDED FOR ANXIETY Patient taking differently: Take 25 mg by mouth every 6 (six) hours as needed for anxiety.  08/12/19  Yes Bartholomew Crews, MD    lisinopril (ZESTRIL) 10 MG tablet TAKE 1 TABLET BY MOUTH EVERY DAY Patient taking differently: Take 10 mg by mouth daily.  04/23/19  Yes Helberg, Larkin Ina, MD  metoprolol tartrate (LOPRESSOR) 25 MG tablet Take 0.5 tablets (12.5 mg total) by mouth 2 (two) times daily. Patient taking differently: Take 25 mg by mouth 2 (two) times daily.  06/29/18  Yes Helberg, Larkin Ina, MD  mupirocin ointment (BACTROBAN) 2 % Apply 1 application topically 2 (two) times daily. 09/06/19  Yes [provider]  nystatin (MYCOSTATIN/NYSTOP) powder Apply topically 4 times daily. After cleaning and drying the affected area for two weeks Patient taking differently: Apply 1 application topically in the morning, at noon, in the evening, and at bedtime.  09/10/19  Yes Helberg, Larkin Ina, MD  oxyCODONE (OXY IR/ROXICODONE) 5 MG immediate release tablet Take 1-2 tablets (5-10 mg total) by mouth every 4 (four) hours as needed for moderate pain (pain score 4-6). 09/06/19  Yes Persons, Bevely [place], PA  pregabalin (LYRICA) 50 MG capsule Take 1 capsule (50 mg total) by mouth 2 (two) times daily. Patient taking differently: Take 50 mg by mouth 3 (three) times daily.  11/08/18  Yes Helberg, Larkin Ina, MD  terbinafine (LAMISIL) 250 MG tablet Take 1 tablet (250 mg total) by mouth daily. 09/02/19  Yes Antonietta Breach, PA-C  tetrahydrozoline 0.05 % ophthalmic solution Place 2 drops into both eyes 2 (two) times daily as needed (red/ dry eyes). [place]   Yes [provider]  VICTOZA 18 MG/3ML SOPN Inject 0.3 mLs (1.8 mg total) below the skin daily. Patient taking differently: Inject 1.8 mg into the skin daily.  07/22/19  Yes Helberg, Larkin Ina, MD  aluminum chloride (DRYSOL) 20 % external solution Apply topically at bedtime. Patient not taking: Reported on 09/18/2019 10/23/18   Suzan Slick, NP  aspirin EC 325 MG EC tablet Take 1 tablet (325 mg total) by mouth daily. Patient not taking: Reported on 09/18/2019 09/06/19   Persons, Bevely [place], [place]  blood glucose  meter kit and supplies KIT ICD 10- E11.69. Based on patient and insurance preference. Use  up to four times daily as directed. 05/01/18   Love, Ivan Anchors, PA-C  TRUEPLUS PEN NEEDLES 31G X 5 MM MISC Use pen needle with insulin 3 times daily Patient taking differently: 1 each by Other route 3 (three) times daily.  08/12/19   Bartholomew Crews, MD   DG Knee 2 Views Left  Result Date: 09/18/2019 CLINICAL DATA:  Patient with wound to the amputation site. EXAM: LEFT KNEE - 1-2 VIEW COMPARISON:  None. FINDINGS: Soft tissue overlying the distal aspect of the amputated tibia and fibula contains gas. No evidence for acute fracture or dislocation. Knee joint degenerative changes. Amputation of the distal aspect of the tibia and fibula. IMPRESSION: Gas within the soft tissues overlying the distal aspect of the tibia and fibula most compatible with reported injury. No definite evidence for acute fracture. Electronically Signed   By: Lovey Newcomer M.D.   On: 09/18/2019 18:27   - pertinent xrays, CT, MRI studies were reviewed and independently interpreted  Positive ROS: All other systems have been reviewed and were otherwise negative with the exception of those mentioned in the HPI and as above.  Physical Exam: General: Alert, no acute distress Psychiatric: Patient is competent for consent with normal mood and affect Lymphatic: No axillary or cervical lymphadenopathy Cardiovascular: No pedal edema Respiratory: No cyanosis, no use of accessory musculature GI: No organomegaly, abdomen is soft and non-tender    Images:  _0 @  Labs:  Lab Results  Component Value Date   HGBA1C 8.5 (H) 09/04/2019   HGBA1C 7.0 (A) 05/30/2019   HGBA1C 6.6 (A) 02/28/2019   ESRSEDRATE 130 (H) 03/24/2017   ESRSEDRATE 53 (H) 09/14/2015   ESRSEDRATE 132 (H) 07/06/2012   CRP 13.6 (H) 02/14/2019   CRP 38.6 (H) 04/03/2018   CRP 5.4 (H) 09/14/2015   LABURIC 8.3 (H) 04/15/2019   REPTSTATUS 09/09/2019 FINAL 09/04/2019    GRAMSTAIN  09/04/2019    FEW WBC PRESENT, PREDOMINANTLY PMN MODERATE GRAM POSITIVE COCCI    CULT  09/04/2019    ABUNDANT STREPTOCOCCUS GROUP G Beta hemolytic streptococci are predictably susceptible to penicillin and other beta lactams. Susceptibility testing not routinely performed. FEW STAPHYLOCOCCUS AUREUS NO ANAEROBES ISOLATED Performed at [facility], [place] [address] [place], [place] [address]    [place] 09/04/2019    Lab Results  Component Value Date   ALBUMIN 2.3 (L) 09/18/2019   ALBUMIN 1.8 (L) 09/01/2019   ALBUMIN 2.1 (L) 02/12/2019   LABURIC 8.3 (H) 04/15/2019    Neurologic: Patient does not have protective sensation bilateral lower extremities.   MUSCULOSKELETAL:   Skin: Patient has acute dehiscence of the surgical incision.  Patient is otherwise asymptomatic no other trauma.  Patient's most recent hemoglobin A1c is 8.5.  Albumin 2.3.  Hemoglobin 6.9.  Assessment: Diabetic insensate neuropathy with severe protein caloric malnutrition with acute traumatic dehiscence left transtibial amputation secondary to fall.  Plan: Will plan for revision of the left transtibial amputation.  Patient does not want a higher level amputation.  Do not feel the patient would be able to ambulate with an above-knee amputation.  Plan for surgical intervention today.  Patient most likely will need discharge to skilled nursing.  Patient will need 2 units of packed red blood cells today.  Blood transfusion do not need to be completed prior to surgery, but running prior to surgery.  Thank you for the consult and the opportunity to see Ms. [place], MD [facility] [phone] 6:56 AM

## 2019-09-20 NOTE — Transfer of Care (Signed)
Immediate Anesthesia Transfer of Care Note  Patient: [place]  Procedure(s) Performed: LEFT BELOW KNEE AMPUTATION REVISION (Left )  Patient Location: PACU  Anesthesia Type:General  Level of Consciousness: drowsy  Airway & Oxygen Therapy: Patient Spontanous Breathing and Patient connected to nasal cannula oxygen  Post-op Assessment: Report given to RN, Post -op Vital signs reviewed and stable and Patient moving all extremities  Post vital signs: Reviewed and stable  Last Vitals:  Vitals Value Taken Time  BP 138/118 09/20/19 1545  Temp 36.3 C 09/20/19 1545  Pulse 75 09/20/19 1553  Resp 8 09/20/19 1553  SpO2 100 % 09/20/19 1553  Vitals shown include unvalidated device data.  Last Pain:  Vitals:   09/20/19 1545  TempSrc:   PainSc: 10-Worst pain ever      Patients Stated Pain Goal: 2 (82/08/13 8871)  Complications: No apparent anesthesia complications

## 2019-09-20 NOTE — Anesthesia Preprocedure Evaluation (Addendum)
Anesthesia Evaluation  Patient identified by MRN, date of birth, ID band Patient awake    Reviewed: Allergy & Precautions, NPO status , Patient's Chart, lab work & pertinent test results  History of Anesthesia Complications Negative for: history of anesthetic complications  Airway Mallampati: III  TM Distance: >3 FB Neck ROM: Full    Dental  (+) Dental Advisory Given   Pulmonary asthma (mild) , sleep apnea and Continuous Positive Airway Pressure Ventilation , Current Smoker and Patient abstained from smoking.,    Pulmonary exam normal        Cardiovascular hypertension, Pt. on home beta blockers and Pt. on medications Normal cardiovascular exam     Neuro/Psych PSYCHIATRIC DISORDERS Anxiety Depression Bipolar Disorder negative neurological ROS     GI/Hepatic Neg liver ROS, GERD  ,  Endo/Other  diabetes, Type 2Hypothyroidism (no longer on meds) Morbid obesity Hyponatremia Hyperkalemia Hypocalcemia   Renal/GU CRFRenal disease     Musculoskeletal  (+) Fibromyalgia -  Abdominal (+) + obese,   Peds  Hematology  (+) anemia ,   Anesthesia Other Findings HSV Covid neg 4/28  Reproductive/Obstetrics                            Anesthesia Physical Anesthesia Plan  ASA: III  Anesthesia Plan: General   Post-op Pain Management:    Induction: Intravenous  PONV Risk Score and Plan: 3 and Treatment may vary due to age or medical condition, Ondansetron and Dexamethasone  Airway Management Planned: LMA  Additional Equipment: None  Intra-op Plan:   Post-operative Plan: Extubation in OR  Informed Consent: I have reviewed the patients History and Physical, chart, labs and discussed the procedure including the risks, benefits and alternatives for the proposed anesthesia with the patient or authorized representative who has indicated his/her understanding and acceptance.     Dental advisory  given  Plan Discussed with: CRNA and Anesthesiologist  Anesthesia Plan Comments:        Anesthesia Quick Evaluation

## 2019-09-20 NOTE — Hospital Course (Addendum)
Left leg osteomyelitis s/p BKA: Wound dehiscence: Patient was in her usual state of health until 04/28 when she tripped on the parking block and hit the end of her stump. She notes the stitches opened up and her wound began to bleed. Clinical findings on admission were consistent with wound dehiscence after trauma to her stump on 04/28. Throughout her stay, she has been afebrile and without elevations in her WBC. Additionally, her stump did not appear acutely infected. Orthopedics is on board and is planning for revision of the below-knee amputation this afternoon. Orthopedics was consulted and recommended BKA revision, which was performed on 79/02 without complication. On 05/01, there was significant output from her wound of 2.1 L; at that time, her Hgb dropped to 6.4 (likely from her wound) for which she was given PRBCs. She was evaluated by PT/OT who recommended CIR on 05/02.  2. HTN Patient's home BP regimen was continued throughout her hospital stay and was appropriately held when the patient was hypotensive.  3. HLD Patient's Lipitor regimen was continued throughout her hospital stay.  4. Fibromylagia Patient's home regimen was continued throughout her stay.  5. Anemia of Chronic Disease. Hgb 7.3 on presentation, down from 8-8.4 a few weeks ago. This was most likely due to acute blood loss from her wound dehiscence. On 04/30, her Hgb level dropped to 6.9, and she was given 2 units PRBCs. As mentioned above, she was also given PRBCs postoperatively for an acute drop in Hgb.   6. Tobacco Use: Patient is a current smoker with an eight-pack-year history who is interested in quitting smoking. - Nicotine replacement - Will need smoking cessation assistance on discharge

## 2019-09-20 NOTE — Care Management Important Message (Signed)
Important Message  Patient Details  Name: NATONYA FINSTAD MRN: [id number] Date of Birth: Oct 15, 1975   Medicare Important Message Given:  Yes     Shalla Bulluck 09/20/2019, 1:26 PM

## 2019-09-20 NOTE — Op Note (Signed)
09/20/2019  3:49 PM  PATIENT:  [name] [name]    PRE-OPERATIVE DIAGNOSIS:  left below knee amputation wound dehiscence  POST-OPERATIVE DIAGNOSIS:  Same  PROCEDURE:  LEFT BELOW KNEE AMPUTATION REVISION Placement of Prevena customizable and arthroform wound VAC dressing  SURGEON:  Newt Minion, MD  PHYSICIAN ASSISTANT:None ANESTHESIA:   General  PREOPERATIVE INDICATIONS:  [name] [name] is a  44 y.o. female with a diagnosis of left below knee amputation wound dehiscence who failed conservative measures and elected for surgical management.    The risks benefits and alternatives were discussed with the patient preoperatively including but not limited to the risks of infection, bleeding, nerve injury, cardiopulmonary complications, the need for revision surgery, among others, and the patient was willing to proceed.  OPERATIVE IMPLANTS: Praveena customizable and wound VAC dressing  @ENCIMAGES @  OPERATIVE FINDINGS: No deep abscess skin and soft tissue muscle fascia resected margins clear  OPERATIVE PROCEDURE: Patient was brought the operating room and underwent a general anesthetic.  After adequate levels anesthesia were obtained patient's left lower extremity was prepped using DuraPrep draped into a sterile field a timeout was called.  A fishmouth incision was made around the dehisced wound.  Proximally 2 cm margins were taken down to bone.  The wound margins were clear skin and soft tissue muscle and fascia was excised sharply.  Rondure was used for further debridement of any nonviable tissue.  A rondure was used to further debride soft tissue over the distal tibia and fibula.  Electrocautery was used for hemostasis wound was irrigated with normal saline.  The incision was closed using 2-0 nylon.  The Praveena customizable and Arnell Sieving form wound VAC dressings were applied this had a good suction fit patient was extubated taken the PACU in stable condition.   DISCHARGE  PLANNING:  Antibiotic duration: Continue antibiotics for 24 hours  Weightbearing: Nonweightbearing on the left  Pain medication: Opioid pathway  Dressing care/ Wound VAC: Continue wound VAC for 1 week at discharge  Ambulatory devices: Walker  Discharge to: Anticipate discharge to home  Follow-up: In the office 1 week post operative.

## 2019-09-20 NOTE — Progress Notes (Signed)
Nurse dress Pt left stump this shift with ABDs, kirlex and coban as it was open and draining. Will continue to monitor

## 2019-09-20 NOTE — Progress Notes (Signed)
   Subjective: Ms. Bacot was seen at bedside this AM. She reports that her pain has been the same since her admission, but she has adequate pain control with current medication regimen. She also c/o intermittent fatigue. Denies fever, chills, CP, SOB. There are no other complaints or concerns at this time.  Objective:  Vital signs in last 24 hours: Vitals:   09/19/19 2017 09/20/19 0049 09/20/19 0452 09/20/19 0751  BP: 127/78 108/69 116/73 122/66  Pulse: 78 72 71 71  Resp: 16 18 18 18   Temp: 97.7 F (36.5 C) 97.6 F (36.4 C) (!) 97.4 F (36.3 C) 97.7 F (36.5 C)  TempSrc: Oral Oral Oral Oral  SpO2: 98% 93% 97% 99%   Weight change:   Intake/Output Summary (Last 24 hours) at 09/20/2019 0859 Last data filed at 09/20/2019 0500 Gross per 24 hour  Intake -  Output 650 ml  Net -650 ml   Physical Exam Constitutional: well-developed, well-nourished. NAD. Sitting comfortably in bed.  HEENT: Pale conjunctivae bilaterally Cardiovascular: RRR. No rubs or gallops. Capillary refill < 2 seconds. Pulmonary/Chest: CTAB, no wheezes, rales, or rhonchi.  Musculoskeletal: Left BKA.  Neurological: A&Ox3, CN II - XII grossly intact.  Skin: Warm and dry. No pitting edema in BLE. Wound bandaging and gauze to the left stump is clean, dry, and intact. Psychiatric: Normal mood and affect   Assessment/Plan:  NINAMARIE KEEL is a 44 y.o. female w/ a PMHx of left leg osteomyelitis s/p BKA in 2018,  s/p two BKA revisions (in 2019 and most recently April 2021), HTN, HLD, fibromyalgia, DM, HFpEF, anemia of chronic disease and hyperthyroidism with wound dehiscence after undergoing trauma to her stump on 09/18/19; she is currently on hospital day 3 and is stable.   Left leg osteomyelitis s/p BKA: Wound dehiscence: Clinical findings are consistent with wound dehiscence after trauma to her stump on 04/28. She has been afebrile and without elevations in her WBC. On recent exam, there is wound dehiscence,  but her stump does not appear to be acutely infected at this time. Orthopedics is on board and is planning for revision of the below-knee amputation this afternoon. - Ortho is on board; appreciate their recs.  - Will undergo BKA revision today. - Pain control with tylenol, oxycodone, IV dilaudid - Trend fever curve/monitor WBC count  HTN: - Continue home amlodipine, bumetanide, metoprolol and lisinopril  HLD: - Continue home lipitor  Fibromyalgia: - Continue home amitriptyline, oxycodone, flexeril and lyrica  DM: - SSI  HFpEF: - Continue home BP meds as above  Anemia of Chronic Disease: Hgb 7.3 >> 6.9 during current admission, overall down from 8-8.4 a few weeks ago. This could be due to acute blood loss from her wound dehiscence as above.  - Plan for transfusion of 2 units PRBCs. Blood transfusion does not need to be completed prior to surgery, but running prior to surgery.  - Daily CBC  Anxiety: - Continue home prozac   Tobacco Use: Patient is a current smoker with an eight-pack-year history who is interested in quitting smoking. - Nicotine replacement - Will need smoking cessation assistance on discharge  FEN/GI: - NPO prior to procedure   Active Problems:   Diabetic polyneuropathy associated with type 2 diabetes mellitus ([place])   Fall   Wound dehiscence    LOS: 2 days   Orvis Brill, Medical Student 09/20/2019, 8:59 AM

## 2019-09-20 NOTE — Anesthesia Procedure Notes (Signed)
Procedure Name: LMA Insertion Date/Time: 09/20/2019 2:57 PM Performed by: Meade Hogeland T, CRNA Pre-anesthesia Checklist: Patient identified, Emergency Drugs available, Suction available and Patient being monitored Patient Re-evaluated:Patient Re-evaluated prior to induction Oxygen Delivery Method: Circle system utilized Preoxygenation: Pre-oxygenation with 100% oxygen Induction Type: IV induction Ventilation: Mask ventilation without difficulty LMA Size: 5.0 Number of attempts: 1 Placement Confirmation: positive ETCO2 and breath sounds checked- equal and bilateral Tube secured with: Tape Dental Injury: Teeth and Oropharynx as per pre-operative assessment

## 2019-09-21 DIAGNOSIS — I5030 Unspecified diastolic (congestive) heart failure: Secondary | ICD-10-CM

## 2019-09-21 DIAGNOSIS — E1165 Type 2 diabetes mellitus with hyperglycemia: Secondary | ICD-10-CM

## 2019-09-21 DIAGNOSIS — Z89512 Acquired absence of left leg below knee: Secondary | ICD-10-CM

## 2019-09-21 DIAGNOSIS — E875 Hyperkalemia: Secondary | ICD-10-CM

## 2019-09-21 DIAGNOSIS — E785 Hyperlipidemia, unspecified: Secondary | ICD-10-CM

## 2019-09-21 DIAGNOSIS — M797 Fibromyalgia: Secondary | ICD-10-CM

## 2019-09-21 DIAGNOSIS — I10 Essential (primary) hypertension: Secondary | ICD-10-CM

## 2019-09-21 LAB — CBC
HCT: 20.6 % — ABNORMAL LOW (ref 36.0–46.0)
HCT: 23.5 % — ABNORMAL LOW (ref 36.0–46.0)
Hemoglobin: 6.4 g/dL — CL (ref 12.0–15.0)
Hemoglobin: 7.7 g/dL — ABNORMAL LOW (ref 12.0–15.0)
MCH: 26.6 pg (ref 26.0–34.0)
MCH: 27.3 pg (ref 26.0–34.0)
MCHC: 31.1 g/dL (ref 30.0–36.0)
MCHC: 32.8 g/dL (ref 30.0–36.0)
MCV: 85.5 fL (ref 80.0–100.0)
MCV: 83.3 fL (ref 80.0–100.0)
Platelets: 254 10*3/uL (ref 150–400)
Platelets: 224 10*3/uL (ref 150–400)
RBC: 2.41 MIL/uL — ABNORMAL LOW (ref 3.87–5.11)
RBC: 2.82 MIL/uL — ABNORMAL LOW (ref 3.87–5.11)
RDW: 14.2 % (ref 11.5–15.5)
RDW: 14.1 % (ref 11.5–15.5)
WBC: 6 10*3/uL (ref 4.0–10.5)
WBC: 7.2 10*3/uL (ref 4.0–10.5)
nRBC: 0 % (ref 0.0–0.2)
nRBC: 0 % (ref 0.0–0.2)

## 2019-09-21 LAB — BASIC METABOLIC PANEL
Anion gap: 10 (ref 5–15)
Anion gap: 9 (ref 5–15)
BUN: 49 mg/dL — ABNORMAL HIGH (ref 6–20)
BUN: 54 mg/dL — ABNORMAL HIGH (ref 6–20)
CO2: 19 mmol/L — ABNORMAL LOW (ref 22–32)
CO2: 20 mmol/L — ABNORMAL LOW (ref 22–32)
Calcium: 7.7 mg/dL — ABNORMAL LOW (ref 8.9–10.3)
Calcium: 7.7 mg/dL — ABNORMAL LOW (ref 8.9–10.3)
Chloride: 100 mmol/L (ref 98–111)
Chloride: 102 mmol/L (ref 98–111)
Creatinine, Ser: 2.79 mg/dL — ABNORMAL HIGH (ref 0.44–1.00)
Creatinine, Ser: 3.07 mg/dL — ABNORMAL HIGH (ref 0.44–1.00)
GFR calc Af Amer: 23 mL/min — ABNORMAL LOW (ref >60)
GFR calc Af Amer: 21 mL/min — ABNORMAL LOW (ref >60)
GFR calc non Af Amer: 20 mL/min — ABNORMAL LOW (ref >60)
GFR calc non Af Amer: 18 mL/min — ABNORMAL LOW (ref >60)
Glucose, Bld: 367 mg/dL — ABNORMAL HIGH (ref 70–99)
Glucose, Bld: 337 mg/dL — ABNORMAL HIGH (ref 70–99)
Potassium: 6.6 mmol/L (ref 3.5–5.1)
Potassium: 5.2 mmol/L — ABNORMAL HIGH (ref 3.5–5.1)
Sodium: 129 mmol/L — ABNORMAL LOW (ref 135–145)
Sodium: 131 mmol/L — ABNORMAL LOW (ref 135–145)

## 2019-09-21 LAB — PREPARE RBC (CROSSMATCH)

## 2019-09-21 LAB — GLUCOSE, CAPILLARY
Glucose-Capillary: 284 mg/dL — ABNORMAL HIGH (ref 70–99)
Glucose-Capillary: 340 mg/dL — ABNORMAL HIGH (ref 70–99)
Glucose-Capillary: 342 mg/dL — ABNORMAL HIGH (ref 70–99)
Glucose-Capillary: 450 mg/dL — ABNORMAL HIGH (ref 70–99)
Glucose-Capillary: 475 mg/dL — ABNORMAL HIGH (ref 70–99)

## 2019-09-21 MED ORDER — INSULIN GLARGINE 100 UNIT/ML ~~LOC~~ SOLN
30.0000 [IU] | Freq: Every day | SUBCUTANEOUS | Status: DC
  Administered 2019-09-21 – 2019-09-25 (×5): 30 [IU] via SUBCUTANEOUS
  Filled 2019-09-21 (×6): qty 0.3

## 2019-09-21 MED ORDER — SODIUM CHLORIDE 0.9% IV SOLUTION: Freq: Once | INTRAVENOUS | Status: AC

## 2019-09-21 MED ORDER — SODIUM ZIRCONIUM CYCLOSILICATE 10 G PO PACK
10.0000 g | PACK | Freq: Two times a day (BID) | ORAL | Status: AC
  Administered 2019-09-21 (×2): 10 g via ORAL
  Filled 2019-09-21 (×2): qty 1

## 2019-09-21 NOTE — Progress Notes (Signed)
   Subjective: Patient reports that she is having some pain in her legs. She reports that she is having some bleeding from her leg. She denies any chest pain, SOB, nausea, vomiting, or other issues. She expressed fustration about her blood sugars being elevated, she reports that it's always controlled when she is on her home Victoza. We discussed that it looks like she received a dose of steroids during her procedure that also could be contributing to her high blood sugars. Discussed that her blood counts were low again today and that we will be giving her blood and repeat it later today.   Objective:  Vital signs in last 24 hours: Vitals:   09/20/19 1702 09/20/19 1937 09/21/19 0030 09/21/19 0335  BP: 113/73 117/63 120/61 114/65  Pulse: 75 85 89 83  Resp: 16 18 16 14   Temp: 98.4 F (36.9 C) 98.4 F (36.9 C) 99.3 F (37.4 C) 97.8 F (36.6 C)  TempSrc: Oral Oral Oral Oral  SpO2: 100% 98% 97% (!) 86%   General: Middle aged female, NAD, laying in bed Cardiac: RRR, no m/r/g Pulmonary: CTABL, no wheezing, rhonchi, rales Extremities: Left BKA, wound vac in place, bloody drainage noted  Assessment/Plan:  Active Problems:   Diabetic polyneuropathy associated with type 2 diabetes mellitus ([place])   Fall   Wound dehiscence  This is a 44 year old female with a history of left leg osteomyelitis status post BKA in 2018 with recent revisions and April 2021, hypertension, hyperlipidemia, fibromyalgia, diabetes, HFpEF, anemia of chronic disease, and hypothyroidism who presented after falling on her wound causing wound dehiscence.  Left leg BKA wound dehiscence: Status post wound revision of BKA on 09/20/2019.  Noted to have significant output from wound today of 2.1L. Hgb dropped today to 6.4. Yesterday it was 6.9 prior to surgery, she was given 2 units and following the surgery Hgb was up to 7.9. Hgb drop is likely from wound. She denied any other sources of bleeding. Will give 2 units now.    -Orthopedics following, appreciate recommendations -Continue pain control with oxycodone and dilaudid PRN -Stop antibiotics later today, end time updated -Wound vac in place, monitor output -2 units pRBC, H+H post transfusion -Continue PT/OT  Diabetes mellitus: -Hyperglycemic this morning, up to 500s. She appears to have received a dose of dexamethasone during the procedure for a post-op nausea vomiting risk of 3, this may be contributing to her hyperglycemia. She has received 31 units of Novolog today with her CBGs still in the 200-300s. Will add long acting coverage with Lantus, will start at 30 units nightly.   -Continue SSI -Add Lantus 30 units QHS -Monitor CBGs  Hyperkalemia:  -K this morning was up to 6.6, no visible hemolysis noted. Prior to procedure it was 5.2. Unclear etiology, the blood products that she received may be contributing. EKG showed no evidence of peaked T waves. -Lokelma 2 packets -Repeat K in PM  -Bmp in AM  HTN: - Continue home amlodipine,bumetanide,metoprolol and lisinopril  HLD: - Continue home lipitor  Fibromyalgia: - Continue home amitriptyline, oxycodone, flexeriland lyrica  DM: - SSI  HFpEF: - Continue home BP meds as above  Prior to Admission Living Arrangement: Home Anticipated Discharge Location: TBD Barriers to Discharge: Pending clinical improvement Dispo: Anticipated discharge in approximately 1-3 day(s).   Asencion Noble, MD 09/21/2019, 5:58 AM Pager: [phone]

## 2019-09-21 NOTE — Plan of Care (Signed)

## 2019-09-21 NOTE — Progress Notes (Signed)
Nurse and tech assigned to this Patient smelled smoke from patient room , we asked her and she stated it was her oil , nurse asked if I can check it out and we noted the transparent zip back she handed to me has cigarette pack in it, I asked the patient and the tech to watch while I opened it and noted marijuana  inside the cigarette pack and a crusher also was inside the bag, pt got angry as I called security to come and check it out, when security came, they did check it and the room, and concluded that  Although its not legal , the patient is not supposed to used it here in the hospital. Pt understood and promised not to used it and also agreed for me to keep it away from her to reach. Security didn't take it away ,so the nurse put the bag on the top cabinet and notify the attending doctor .AC notified and he came and educated [place] on the policies about pt belongings,but made it clear that security should have collected the items. Will call security again and see what they will say or do. Will continue to monitor.

## 2019-09-21 NOTE — Progress Notes (Addendum)
  CRITICAL VALUE ALERT  Critical Value:  Hemoglobin 6.4  Date & Time Notied:  09/21/2019 08:35  Provider Notified: Family Medicine  Orders Received/Actions taken: Awaiting for new orders. -- Order for 2units PRBC transfusion received.  CRITICAL VALUE ALERT  Critical Value:  Potassium 6.6  Date & Time Notied:  05/01 09:06  Provider Notified: Family Medicine  Orders Received/Actions taken: New orders received

## 2019-09-21 NOTE — Progress Notes (Signed)
Nurse change pt wound Vac Cannister 3 times last night and 2 of them were full, output updated, day shift nurse notified, will continue to monitor.

## 2019-09-21 NOTE — Progress Notes (Signed)
Patient ID: [name] [name], female   DOB: [birth year], 44 y.o.   MRN: [id number] Patient is postoperative day 1 revision amputation status post traumatic dehiscence.  There is approximately 100 cc in the wound VAC canister patient states the dressing did drain yesterday.  Will continue inpatient stay with possible discharge early in the week will need to reinforce the dressing at discharge.

## 2019-09-21 NOTE — Progress Notes (Signed)
PT Cancellation Note  Patient Details Name: [name] [name] MRN: [id number] DOB: [birth year]   Cancelled Treatment:    Reason Eval/Treat Not Completed: Medical issues which prohibited therapy. Pt with 6.4 Hgb. PRBCs transfusing now. PT to re-attempt eval as time allows.   Lorriane Shire 09/21/2019, 1:13 PM  Lorrin Goodell, PT  Office # [phone] Pager [phone]

## 2019-09-21 NOTE — Progress Notes (Signed)
Patient blood sugar was 502 at 2126 on 09/20/19, paged attending doctor and got an order for 20 unit insulin, administered and rechecked it at [place] and it was 475, paged attending doctor again to notify her about patient results. Waiting for call back, patient doing well, asymptomatic, will continue to monitor.

## 2019-09-21 NOTE — Progress Notes (Signed)
Pt completed 2 units PRBC transfusion, no reactions noted, vital signs taken and recorded.

## 2019-09-21 NOTE — Progress Notes (Signed)
Occupational Therapy Evaluation Patient Details Name: [name] [name] MRN: [id number] DOB: 06/28/1975 Today's Date: 09/21/2019    History of Present Illness Patient is a 44 year old woman who is status post a left transtibial amputation presenting to [facility] due to revision amputation status post traumatic dehiscence. PMH significant of OSA on CPAP, DM 2, LE edema, Cellulitis, acute low back pain, and fibromyalgia.   Clinical Impression   Pt presents with above diagnosis. PTA pt PLOF living at home with family and PCA for some assistance as needed with ADLs and IADLs but mostly Mod I with use of AE and increased time. Pt currently limited due to pain, safety awareness, limited function with functional transfers. Pt will benefit from additional acute OT to address established deficits to maximize independence prior to DC. DC recommendation to CIR to provided additional practice and education to safely return to home setting.    Follow Up Recommendations  Supervision/Assistance - 24 hour;CIR    Equipment Recommendations  Tub/shower bench    Recommendations for Other Services       Precautions / Restrictions Precautions Precautions: Fall Precaution Comments: LLE wound vac Restrictions Weight Bearing Restrictions: Yes Other Position/Activity Restrictions: BKA revision      Mobility Bed Mobility Overal bed mobility: Independent             General bed mobility comments: assist to manage line and leads  Transfers Overall transfer level: Needs assistance Equipment used: Rolling walker (2 wheeled) Transfers: Sit to/from Stand Sit to Stand: Min guard         General transfer comment: reports concerns for education of how to manage crutches, since crutches led to ground level fall.    Balance Overall balance assessment: Needs assistance Sitting-balance support: No upper extremity supported;Feet supported Sitting balance-Leahy Scale: Good     Standing balance support:  Bilateral upper extremity supported Standing balance-Leahy Scale: Poor Standing balance comment: reliant of UE support with RW                           ADL either performed or assessed with clinical judgement   ADL Overall ADL's : Needs assistance/impaired Eating/Feeding: Independent   Grooming: Wash/dry hands;Wash/dry face;Oral care;Brushing hair;Modified independent Grooming Details (indicate cue type and reason): sitting up eob Upper Body Bathing: Modified independent;Sitting   Lower Body Bathing: Set up;Sitting/lateral leans   Upper Body Dressing : Modified independent;Sitting   Lower Body Dressing: Modified independent;Sitting/lateral leans   Toilet Transfer: Min guard;Minimal assistance Toilet Transfer Details (indicate cue type and reason): simulated toilet transfer from bed <> bed, no physical assistance required to power up to stand. Min a for safety.         Functional mobility during ADLs: Minimal assistance General ADL Comments: Good UE strength for functional transfer and ADL engagement     Vision         Perception     Praxis      Pertinent Vitals/Pain Pain Assessment: 0-10 Pain Score: 7  Pain Location: LLE Pain Descriptors / Indicators: Aching Pain Intervention(s): Limited activity within patient's tolerance;Premedicated before session;Monitored during session;Repositioned     Hand Dominance Right   Extremity/Trunk Assessment Upper Extremity Assessment Upper Extremity Assessment: Overall WFL for tasks assessed   Lower Extremity Assessment Lower Extremity Assessment: Defer to PT evaluation   Cervical / Trunk Assessment Cervical / Trunk Assessment: Normal   Communication     Cognition Arousal/Alertness: Awake/alert Behavior During Therapy: WFL for tasks assessed/performed Overall  Cognitive Status: Within Functional Limits for tasks assessed                                 General Comments: Pt cooperative and  pleasant   General Comments  Pt reports decreased hand strength and sensation due to neuropathy    Exercises     Shoulder Instructions      Home Living Family/patient expects to be discharged to:: Private residence Living Arrangements: Other relatives Available Help at Discharge: Family;Available PRN/intermittently Type of Home: House Home Access: Stairs to enter [employer] of Steps: back porch - 2   Home Layout: One level     Bathroom Shower/Tub: Teacher, early years/pre: Standard Bathroom Accessibility: No   Home Equipment: Wheelchair - [employer] - 2 wheels;Cane - single point   Additional Comments: Pt reports lowering into bath tub to bathe, states there is no room for tub bench. concerns for safety with bathroom accessibility      Prior Functioning/Environment Level of Independence: Independent with assistive device(s)    ADL's / Homemaking Assistance Needed: reports PCA to assist with ADLs and IADLs as needed            OT Problem List: Decreased activity tolerance;Impaired balance (sitting and/or standing);Decreased safety awareness;Decreased knowledge of use of DME or AE;Decreased knowledge of precautions;Pain;Impaired sensation      OT Treatment/Interventions: Self-care/ADL training;Therapeutic exercise;Therapeutic activities;Patient/family education;Balance training    OT Goals(Current goals can be found in the care plan section) Acute Rehab OT Goals Patient Stated Goal: go home OT Goal Formulation: With patient Time For Goal Achievement: 10/05/19 Potential to Achieve Goals: Fair  OT Frequency: Min 2X/week   Barriers to D/C: Inaccessible home environment;Decreased caregiver support          Co-evaluation              AM-PAC OT "6 Clicks" Daily Activity     Outcome Measure Help from another person eating meals?: None Help from another person taking care of personal grooming?: None Help from another person toileting,  which includes using toliet, bedpan, or urinal?: A Little Help from another person bathing (including washing, rinsing, drying)?: A Little Help from another person to put on and taking off regular upper body clothing?: A Little Help from another person to put on and taking off regular lower body clothing?: A Little 6 Click Score: 20   End of Session Equipment Utilized During Treatment: Gait belt;Rolling walker(wound vac) Nurse Communication: Mobility status;Precautions  Activity Tolerance: Patient tolerated treatment well;No increased pain Patient left: in bed;with call bell/phone within reach;with bed alarm set  OT Visit Diagnosis: Unsteadiness on feet (R26.81);Pain;History of falling (Z91.81) Pain - Right/Left: Left Pain - part of body: Leg                Time: 4008-6761 OT Time Calculation (min): 30 min Charges:  OT General Charges $OT Visit: 1 Visit OT Evaluation $OT Eval Low Complexity: 1 Low OT Treatments $Self Care/Home Management : 8-22 mins  Minus Breeding, MSOT, OTR/L  Supplemental Rehabilitation Services  [phone]   Marius Ditch 09/21/2019, 10:05 AM

## 2019-09-21 NOTE — Plan of Care (Signed)

## 2019-09-21 NOTE — Progress Notes (Signed)
  Date: 09/21/2019  Patient name: [name] [name]  Medical record number: [id number]  Date of birth: June 01, 1975        I have seen and evaluated this patient and I have discussed the plan of care with the house staff. Please see their note for complete details. I concur with their findings.  Bartholomew Crews, MD 09/21/2019, 1:47 PM

## 2019-09-21 NOTE — Progress Notes (Signed)
Started 1 unit PRBC, vital signs taken and recorded, no reaction noted. Will continue to monitor.

## 2019-09-21 NOTE — Anesthesia Postprocedure Evaluation (Signed)
Anesthesia Post Note  Patient: [name] [name]  Procedure(s) Performed: LEFT BELOW KNEE AMPUTATION REVISION (Left )     Patient location during evaluation: PACU Anesthesia Type: General Level of consciousness: sedated Pain management: pain level controlled Vital Signs Assessment: post-procedure vital signs reviewed and stable Respiratory status: spontaneous breathing and respiratory function stable Cardiovascular status: stable Postop Assessment: no apparent nausea or vomiting Anesthetic complications: no    Last Vitals:  Vitals:   09/21/19 0030 09/21/19 0335  BP: 120/61 114/65  Pulse: 89 83  Resp: 16 14  Temp: 37.4 C 36.6 C  SpO2: 97% (!) 86%    Last Pain:  Vitals:   09/21/19 0337  TempSrc:   PainSc: [place]

## 2019-09-22 LAB — BASIC METABOLIC PANEL
Anion gap: 10 (ref 5–15)
BUN: 56 mg/dL — ABNORMAL HIGH (ref 6–20)
CO2: 18 mmol/L — ABNORMAL LOW (ref 22–32)
Calcium: 7.7 mg/dL — ABNORMAL LOW (ref 8.9–10.3)
Chloride: 104 mmol/L (ref 98–111)
Creatinine, Ser: 3.11 mg/dL — ABNORMAL HIGH (ref 0.44–1.00)
GFR calc Af Amer: 20 mL/min — ABNORMAL LOW (ref >60)
GFR calc non Af Amer: 17 mL/min — ABNORMAL LOW (ref >60)
Glucose, Bld: 366 mg/dL — ABNORMAL HIGH (ref 70–99)
Potassium: 5.5 mmol/L — ABNORMAL HIGH (ref 3.5–5.1)
Sodium: 132 mmol/L — ABNORMAL LOW (ref 135–145)

## 2019-09-22 LAB — GLUCOSE, CAPILLARY
Glucose-Capillary: 316 mg/dL — ABNORMAL HIGH (ref 70–99)
Glucose-Capillary: 94 mg/dL (ref 70–99)
Glucose-Capillary: 176 mg/dL — ABNORMAL HIGH (ref 70–99)
Glucose-Capillary: 174 mg/dL — ABNORMAL HIGH (ref 70–99)

## 2019-09-22 LAB — TYPE AND SCREEN
ABO/RH(D): B POS
Antibody Screen: NEGATIVE
Unit division: 0
Unit division: 0
Unit division: 0

## 2019-09-22 LAB — CBC
HCT: 23.4 % — ABNORMAL LOW (ref 36.0–46.0)
Hemoglobin: 7.5 g/dL — ABNORMAL LOW (ref 12.0–15.0)
MCH: 27.4 pg (ref 26.0–34.0)
MCHC: 32.1 g/dL (ref 30.0–36.0)
MCV: 85.4 fL (ref 80.0–100.0)
Platelets: 229 10*3/uL (ref 150–400)
RBC: 2.74 MIL/uL — ABNORMAL LOW (ref 3.87–5.11)
RDW: 14.6 % (ref 11.5–15.5)
WBC: 6.3 10*3/uL (ref 4.0–10.5)
nRBC: 0.3 % — ABNORMAL HIGH (ref 0.0–0.2)

## 2019-09-22 LAB — BPAM RBC
Blood Product Expiration Date: 202106032359
Blood Product Expiration Date: 202106062359
Blood Product Expiration Date: 202106062359
ISSUE DATE / TIME: 202104301124
ISSUE DATE / TIME: 202105011231
ISSUE DATE / TIME: 202105011613
Unit Type and Rh: 7300
Unit Type and Rh: 7300
Unit Type and Rh: 7300

## 2019-09-22 MED ORDER — SODIUM ZIRCONIUM CYCLOSILICATE 10 G PO PACK
10.0000 g | PACK | Freq: Once | ORAL | Status: AC
  Administered 2019-09-22: 10 g via ORAL
  Filled 2019-09-22: qty 1

## 2019-09-22 MED ORDER — HYDROMORPHONE HCL 1 MG/ML IJ SOLN
0.5000 mg | INTRAMUSCULAR | Status: DC | PRN
  Administered 2019-09-22 (×2): 0.5 mg via INTRAVENOUS
  Filled 2019-09-22 (×3): qty 1

## 2019-09-22 MED ORDER — SENNOSIDES-DOCUSATE SODIUM 8.6-50 MG PO TABS
1.0000 | ORAL_TABLET | Freq: Two times a day (BID) | ORAL | Status: DC
  Administered 2019-09-22 – 2019-09-26 (×8): 1 via ORAL
  Filled 2019-09-22 (×8): qty 1

## 2019-09-22 MED ORDER — INSULIN ASPART 100 UNIT/ML ~~LOC~~ SOLN
8.0000 [IU] | Freq: Three times a day (TID) | SUBCUTANEOUS | Status: DC
  Administered 2019-09-22 – 2019-09-26 (×11): 8 [IU] via SUBCUTANEOUS

## 2019-09-22 NOTE — Progress Notes (Signed)
   Subjective: 2 Days Post-Op Procedure(s) (LRB): LEFT BELOW KNEE AMPUTATION REVISION (Left) Patient reports pain as mild.    Objective: Vital signs in last 24 hours: Temp:  [97.4 F (36.3 C)-98.8 F (37.1 C)] 98.2 F (36.8 C) (05/02 0400) Pulse Rate:  [60-77] 60 (05/02 0400) Resp:  [15-17] 16 (05/02 0400) BP: (89-118)/(47-69) 112/69 (05/02 0400) SpO2:  [96 %-100 %] 100 % (05/02 0400)  Intake/Output from previous day: 05/01 0701 - 05/02 0700 In: 1683.6 [P.O.:240; I.V.:575.9; IV Piggyback:867.8] Out: 1850 [Urine:1450; Drains:400] Intake/Output this shift: No intake/output data recorded.  Recent Labs    09/20/19 0551 09/20/19 1951 09/21/19 0642 09/21/19 2113 09/22/19 0332  HGB 6.9* 7.9* 6.4* 7.7* 7.5*   Recent Labs    09/21/19 2113 09/22/19 0332  WBC 7.2 6.3  RBC 2.82* 2.74*  HCT 23.5* 23.4*  PLT 224 229   Recent Labs    09/21/19 2113 09/22/19 0332  NA 131* 132*  K 5.2* 5.5*  CL 102 104  CO2 20* 18*  BUN 54* 56*  CREATININE 3.07* 3.11*  GLUCOSE 337* 366*  CALCIUM 7.7* 7.7*   No results for input(s): LABPT, INR in the last 72 hours.  on 3rd VAC container. 2 units PRBC yesterday  No results found.  Assessment/Plan: 2 Days Post-Op Procedure(s) (LRB): LEFT BELOW KNEE AMPUTATION REVISION (Left) Up with therapy Last Hgb 7.5 at [place] 09/22/2019, 11:34 AM

## 2019-09-22 NOTE — Evaluation (Signed)
Physical Therapy Evaluation Patient Details Name: [name] [name] MRN: [id number] DOB: 1975-07-31 Today's Date: 09/22/2019   History of Present Illness  Patient is a 44 year old woman who is s/p left transtibial amputation revision due to fall on residual limb with traumatic wound dehiscence. PMH significant of L BKA 2018, L BKA revision 08/2019 (approx 2 weeks ago), OSA on CPAP, DM 2, LE edema, cellulitis, acute low back pain, and fibromyalgia.    Clinical Impression  Pt admitted with above diagnosis. On eval, pt required min guard assist transfers and ambulation 5' with RW. She presents with slow, guarded gait. Prior to 08/2019 L BKA revision, pt was ambulating independently with prosthesis and no AD. Pt was mod I at w/c level since 08/2019 L BKA revision. Her fall occurred while attempting to transition to crutches. Pt currently with functional limitations due to the deficits listed below (see PT Problem List). Pt will benefit from skilled PT to increase their independence and safety with mobility to allow discharge to the venue listed below.       Follow Up Recommendations CIR;Supervision for mobility/OOB    Equipment Recommendations  None recommended by PT    Recommendations for Other Services Rehab consult     Precautions / Restrictions Precautions Precautions: Fall;Other (comment) Precaution Comments: LLE wound vac Restrictions Weight Bearing Restrictions: Yes LLE Weight Bearing: Non weight bearing Other Position/Activity Restrictions: BKA revision      Mobility  Bed Mobility Overal bed mobility: Modified Independent             General bed mobility comments: HOB elevated, +rail  Transfers Overall transfer level: Needs assistance Equipment used: Rolling walker (2 wheeled) Transfers: Sit to/from Stand Sit to Stand: Min guard         General transfer comment: min guard for safety, increased time  Ambulation/Gait Ambulation/Gait assistance: Min guard Gait  Distance (Feet): 5 Feet Assistive device: Rolling walker (2 wheeled)   Gait velocity: decreased   General Gait Details: Able to hop on RLE bed to recliner with RW. Min guard assist for safety. Assist to manage IV pole and wound vac. Slow, guarded gait.  Stairs            Wheelchair Mobility    Modified Rankin (Stroke Patients Only)       Balance Overall balance assessment: Needs assistance Sitting-balance support: No upper extremity supported;Feet supported Sitting balance-Leahy Scale: Good     Standing balance support: Bilateral upper extremity supported;During functional activity Standing balance-Leahy Scale: Poor Standing balance comment: reliant of UE support with RW                             Pertinent Vitals/Pain Pain Assessment: 0-10 Pain Score: 7  Pain Location: LLE and abdomen Pain Descriptors / Indicators: Grimacing;Discomfort;Sore;Spasm;Cramping Pain Intervention(s): Limited activity within patient's tolerance;Monitored during session;Repositioned;Premedicated before session    Home Living Family/patient expects to be discharged to:: Private residence Living Arrangements: Other relatives Available Help at Discharge: Family;Available PRN/intermittently Type of Home: House Home Access: Stairs to enter Entrance Stairs-Rails: None Entrance Stairs-Number of Steps: back porch - 2 Home Layout: One level Home Equipment: Wheelchair - [employer] - 2 wheels;Cane - single point;Crutches      Prior Function Level of Independence: Independent with assistive device(s)   Gait / Transfers Assistance Needed: prior to 08/2019 L BKA revision, pt ambulated with prosthesis and no AD. Since revision, pt has been mod I at w/c level. Ambulating short distances  with RW. Was attempting to transition to crutches (as a family member's request) when she fell.  ADL's / Homemaking Assistance Needed: reports PCA to assist with ADLs and IADLs as needed        Hand  Dominance   Dominant Hand: Right    Extremity/Trunk Assessment   Upper Extremity Assessment Upper Extremity Assessment: Overall WFL for tasks assessed    Lower Extremity Assessment Lower Extremity Assessment: LLE deficits/detail LLE Deficits / Details: s/p BKA revision, good hip ROM/strength, wound vac in place    Cervical / Trunk Assessment Cervical / Trunk Assessment: Normal  Communication   Communication: No difficulties  Cognition Arousal/Alertness: Awake/alert Behavior During Therapy: WFL for tasks assessed/performed Overall Cognitive Status: Within Functional Limits for tasks assessed                                        General Comments General comments (skin integrity, edema, etc.): Pt with 6.4 hgb yesterday and received 2 units PRBCs. Today at time of eval, pt hgb 7.5.    Exercises     Assessment/Plan    PT Assessment Patient needs continued PT services  PT Problem List Decreased strength;Decreased mobility;Decreased range of motion;Decreased balance;Decreased activity tolerance;Decreased knowledge of use of DME;Pain;Decreased knowledge of precautions;Decreased safety awareness       PT Treatment Interventions Therapeutic activities;DME instruction;Gait training;Therapeutic exercise;Patient/family education;Balance training;Stair training;Functional mobility training    PT Goals (Current goals can be found in the Care Plan section)  Acute Rehab PT Goals Patient Stated Goal: independence PT Goal Formulation: With patient Time For Goal Achievement: 10/06/19 Potential to Achieve Goals: Good    Frequency Min 3X/week   Barriers to discharge        Co-evaluation               AM-PAC PT "6 Clicks" Mobility  Outcome Measure Help needed turning from your back to your side while in a flat bed without using bedrails?: None Help needed moving from lying on your back to sitting on the side of a flat bed without using bedrails?: None Help  needed moving to and from a bed to a chair (including a wheelchair)?: A Little Help needed standing up from a chair using your arms (e.g., wheelchair or bedside chair)?: A Little Help needed to walk in hospital room?: A Little Help needed climbing 3-5 steps with a railing? : A Lot 6 Click Score: 19    End of Session Equipment Utilized During Treatment: Gait belt Activity Tolerance: Patient tolerated treatment well Patient left: in chair;with call bell/phone within reach Nurse Communication: Mobility status PT Visit Diagnosis: Other abnormalities of gait and mobility (R26.89);Difficulty in walking, not elsewhere classified (R26.2)    Time: 0814-4818 PT Time Calculation (min) (ACUTE ONLY): 24 min   Charges:   PT Evaluation $PT Eval Moderate Complexity: 1 Mod PT Treatments $Gait Training: 8-22 mins        Lorrin Goodell, PT  Office # [phone] Pager [phone]   Lorriane Shire 09/22/2019, 9:29 AM

## 2019-09-22 NOTE — Plan of Care (Signed)

## 2019-09-22 NOTE — Progress Notes (Signed)
Inpatient Rehab Admissions Coordinator Note:   Per therapy recommendations, pt was screened for CIR candidacy by Clemens Catholic, [place] CCC-SLP. At this time, Pt. Appears to have functional decline and is a good candidate for CIR. Please place consult order.    Clemens Catholic, [place], [place] Admissions Coordinator  [phone] ([place]) [phone] (office)

## 2019-09-22 NOTE — Progress Notes (Addendum)
Subjective:  The patient reports that she is doing ok. She currently complains of increasing nighttime pain in her stump as well as new diffuse abdominal pain. She has not had a bowel movement since admission but has been passing flatus. Denies dysuria. She reports some difficulty with initiating stream of urine, but when she gets to the bathroom she "pees out a lot." She also states that her abdomen appears more distended than usual. She denies any chest pain, SOB, nausea, vomiting, or other issues.  Objective:  Vital signs in last 24 hours: Vitals:   09/21/19 1639 09/21/19 1845 09/21/19 2028 09/22/19 0400  BP: (!) 98/55 118/66  112/69  Pulse: 74 75  60  Resp: 16 17  16   Temp: 98.2 F (36.8 C) 98.1 F (36.7 C)  98.2 F (36.8 C)  TempSrc: Oral Oral  Oral  SpO2: 96% 100% 98% 100%   General: Middle aged female, NAD, laying in bed Cardiac: RRR, no m/r/g; no JVD or hepatojugular reflux Pulmonary: CTABL, no wheezing, rhonchi, rales Abdominal: Mildly distended; bowel sounds are decreased throughout. Mild pain with deep palpation of the bilateral lower quadrants.  Extremities: Left BKA, wound vac in place with minimal drainage   Assessment/Plan:  [name] [name] is a 44 y.o. female with a history of left leg osteomyelitis status post BKA in 2018 with recent revisions and April 2021, hypertension, hyperlipidemia, fibromyalgia, diabetes, HFpEF, anemia of chronic disease, and hypothyroidism who presented after falling on her wound causing wound dehiscence.  Active Problems:   Diabetic polyneuropathy associated with type 2 diabetes mellitus ([place])   Fall   Wound dehiscence  Left leg BKA wound dehiscence: Status post wound revision of BKA on 09/20/2019. She has decreased output from her wound compared to yesterday (400 mL output in past 24 hours). Hgb is 7.5 today; no need for further blood transfusions at this time. -Orthopedics following, appreciate recommendations -Continue pain  control with oxycodone and dilaudid PRN -Wound vac in place. Will continue to monitor output -Continue PT/OT, recommending CIR -CIR consulted  Diabetes mellitus: -Patient has been hyperglycemic with most recent glucose of 316. She appears to have received a dose of dexamethasone during her procedure for a post-op nausea vomiting risk of 3, which may be contributing to her hyperglycemia. Will add Novolog for mealtime coverage to her regimen today given persistently elevated glucose levels. -Continue SSI -Continue basal coverage with Lantus 30 units QHS -Will add Novolog today, 8 units, TID for mealtime coverage -Monitor CBGs  Hyperkalemia:  -K this morning was up to 5.5. Prior to procedure it was 5.2. Unclear etiology, though the blood products that she received may be contributing to this. EKG showed no evidence of peaked T waves. -Lokelma 2 packets -Repeat BMP in AM  Abdominal pain likely 2/2 Constipation The patient endorses diffuse abdominal pain today. She reports that she has not yet had a bowel movement but has been passing flatus. She has decreased bowel sounds on exam without significant abdominal tenderness. This could most likely be reflective of constipation 2/2 medications given postoperatively causing decreased bowel function. -Senna-docusate, 2 packets for constipation  HTN: - Continue home amlodipine, bumetanide, metoprolol and lisinopril   HLD: - Continue home lipitor   Fibromyalgia: - Continue home amitriptyline, oxycodone, flexeril and lyrica   HFpEF: - Continue home BP meds as above  Prior to Admission Living Arrangement: Home Anticipated Discharge Location: TBD Barriers to Discharge: Pending clinical improvement Dispo: Anticipated discharge in approximately 1-3 day(s).   Mitzie Na  E, Medical Student 09/22/2019, 9:53 AM Pager: [phone]

## 2019-09-22 NOTE — Progress Notes (Signed)
Inpatient Rehab Admissions Coordinator:   Met with patient at bedside to discuss potential CIR admit. She states she has been to CIR previously following her amputation. Discussed need for supervision/assistance after d/c and pt. Stated that she lived with her grandmother who is elderly and cannot provide assistance and is unsure if anyone else could help. Pt. Did not want to discuss CIR furthter at this time but stated that she may be interested if she can get someone to help her following discharge. Will check back with her tomorrow.   Clemens Catholic, [place], [place] Admissions Coordinator  [phone] ([place]) [phone] (office)

## 2019-09-23 LAB — BASIC METABOLIC PANEL
Anion gap: 7 (ref 5–15)
BUN: 63 mg/dL — ABNORMAL HIGH (ref 6–20)
CO2: 20 mmol/L — ABNORMAL LOW (ref 22–32)
Calcium: 8.2 mg/dL — ABNORMAL LOW (ref 8.9–10.3)
Chloride: 105 mmol/L (ref 98–111)
Creatinine, Ser: 2.92 mg/dL — ABNORMAL HIGH (ref 0.44–1.00)
GFR calc Af Amer: 22 mL/min — ABNORMAL LOW (ref >60)
GFR calc non Af Amer: 19 mL/min — ABNORMAL LOW (ref >60)
Glucose, Bld: 158 mg/dL — ABNORMAL HIGH (ref 70–99)
Potassium: 5.6 mmol/L — ABNORMAL HIGH (ref 3.5–5.1)
Sodium: 132 mmol/L — ABNORMAL LOW (ref 135–145)

## 2019-09-23 LAB — GLUCOSE, CAPILLARY
Glucose-Capillary: 127 mg/dL — ABNORMAL HIGH (ref 70–99)
Glucose-Capillary: 115 mg/dL — ABNORMAL HIGH (ref 70–99)
Glucose-Capillary: 99 mg/dL (ref 70–99)
Glucose-Capillary: 123 mg/dL — ABNORMAL HIGH (ref 70–99)

## 2019-09-23 LAB — CBC
HCT: 25.3 % — ABNORMAL LOW (ref 36.0–46.0)
Hemoglobin: 7.9 g/dL — ABNORMAL LOW (ref 12.0–15.0)
MCH: 27 pg (ref 26.0–34.0)
MCHC: 31.2 g/dL (ref 30.0–36.0)
MCV: 86.3 fL (ref 80.0–100.0)
Platelets: 228 10*3/uL (ref 150–400)
RBC: 2.93 MIL/uL — ABNORMAL LOW (ref 3.87–5.11)
RDW: 14.7 % (ref 11.5–15.5)
WBC: 6.7 10*3/uL (ref 4.0–10.5)
nRBC: 0.3 % — ABNORMAL HIGH (ref 0.0–0.2)

## 2019-09-23 MED ORDER — SODIUM ZIRCONIUM CYCLOSILICATE 10 G PO PACK
10.0000 g | PACK | Freq: Three times a day (TID) | ORAL | Status: AC
  Administered 2019-09-23 (×3): 10 g via ORAL
  Filled 2019-09-23 (×3): qty 1

## 2019-09-23 MED ORDER — CHLORHEXIDINE GLUCONATE CLOTH 2 % EX PADS
6.0000 | MEDICATED_PAD | Freq: Every day | CUTANEOUS | Status: DC
  Administered 2019-09-23 – 2019-09-26 (×4): 6 via TOPICAL

## 2019-09-23 MED ORDER — MUPIROCIN 2 % EX OINT
1.0000 "application " | TOPICAL_OINTMENT | Freq: Two times a day (BID) | CUTANEOUS | Status: DC
  Administered 2019-09-23 – 2019-09-26 (×7): 1 via NASAL
  Filled 2019-09-23: qty 22

## 2019-09-23 NOTE — Progress Notes (Signed)
This is a pleasant woman who is postop day 3 left below-knee amputation revision.  She is doing okay this morning.  Requesting some pain medication.  Vital signs stable lying in bed answering questions appropriately.  No further drainage from the back.  VAC is working but does not have seal appropriate up to transition to [employer]..  Will have nurses check back and reinforce it if needed.  Status post above.  Dr. Sharol Given does have some concerns with patient discharging directly to home as she is at high risk for falling.  I discussed this with the patient that his preference would be a stay in rehab to assure mobility and safety.  She is willing to consider this

## 2019-09-23 NOTE — Progress Notes (Signed)
Inpatient Rehab Admissions Coordinator:   Met with patient at bedside to discuss potential CIR admission. Pt. Stated interest. Will pursue for potential admit this week, pending bed availability. Will begin the process of getting insurance approval.   Clemens Catholic, [place], [place] Admissions Coordinator  [phone] ([place]l) [phone] (office)

## 2019-09-23 NOTE — Progress Notes (Addendum)
   Subjective:  The patient reports that she is doing ok. The patient is still considering options after discharge, such as CIR or living at home with her grandmother. She states that she would like to think about this further. She also reports that the pain in her stump is a 9/10. Ms. Fedorko also reports that she had a couple of bowel movements after she was started on senna-docusate yesterday, and she no longer has abdominal pain. Otherwise, she denies other issues such as CP, SOB, or palpitations.   Objective:  Vital signs in last 24 hours: Vitals:   09/22/19 1430 09/22/19 2003 09/23/19 0338 09/23/19 0913  BP: 111/75 (!) 104/58 108/65 (!) 96/53  Pulse: 73 78 72 76  Resp: 18 14 14 15   Temp: 97.6 F (36.4 C) 98 F (36.7 C) 98.8 F (37.1 C) 98.6 F (37 C)  TempSrc: Oral Oral Oral Oral  SpO2: 100% 100% 98% 100%   General: Middle aged female, NAD, laying in bed Cardiac: RRR, no m/r/g; no JVD or hepatojugular reflux Pulmonary: CTABL, no wheezing, rhonchi, rales Abdominal: Mildly distended; hypoactive bowel sounds. No TTP. Extremities: Left BKA, wound vac in place with minimal drainage    Assessment/Plan:  AZAYLA POLO is a 44 y.o. female with a history of left leg osteomyelitis status post BKA in 2018 with recent revisions and April 2021, hypertension, hyperlipidemia, fibromyalgia, diabetes, HFpEF, anemia of chronic disease, and hypothyroidism who presented after falling on her wound causing wound dehiscence.  Active Problems:   Diabetic polyneuropathy associated with type 2 diabetes mellitus ([place])   Fall   Wound dehiscence  Left leg BKA wound dehiscence: Status post wound revision of BKA on 09/20/2019. No output recorded from wound vac in the past 24 hours. Hgb is 7.9 today; no need for further blood transfusions at this time. Patient will consider CIR; rehab admissions to follow-up for further discussion later today. -Orthopedics following, appreciate  recommendations -Continue pain control with oxycodone -Continue PT/OT, recommending CIR  Diabetes mellitus: -Glucose of 127 today; on SSI, Lantus, and Novolog regimens for recent elevations in glucose levels postoperatively. Continue current regimens at this time. -Continue SSI -Continue basal coverage with Lantus 30 units QHS -Continue Novolog, 8 units, TID for mealtime coverage -Monitor CBGs  Hyperkalemia:  -K this morning was up to 5.6, compared to 5.5 yesterday. Prior to her procedure it was 5.2. EKG showed no evidence of peaked T waves. Unclear etiology, but potential causes include hyperglycemia and recent surgery. The patient was given one packet of Lokelma yesterday, so will give her two packets today. -Lokelma 2 packets -Repeat BMP in AM  Constipation -Senna-docusate, 2 packets  HTN: Patient is on home amlodipine, bumetanide, metoprolol and lisinopril. However, patient has been persistently hypotensive (most recent BP of 96/53), so we will hold metoprolol and amlodipine at this time. -Hold home amlodipine and metoprolol in the setting of hypotension.   HLD: - Continue home lipitor   Fibromyalgia: - Continue home amitriptyline, oxycodone, flexeril and lyrica   HFpEF: - Continue home BP meds as above  Prior to Admission Living Arrangement: Home Anticipated Discharge Location: TBD Barriers to Discharge: Pending consideration of discharge options (CIR vs home health vs SNF) Dispo: Anticipated discharge in approximately 2-3 day(s).   Orvis Brill, Medical Student 09/23/2019, 10:04 AM Pager: [phone]

## 2019-09-23 NOTE — PMR Pre-admission (Shared)
PMR Admission Coordinator Pre-Admission Assessment  Patient: [name] [name] is an 44 y.o., female MRN: [id number] DOB: 25-Mar-1976 Height:   Weight:    Insurance Information HMO: yes    PPO:      PCP:      IPA:      80/20:      OTHER:  PRIMARY: [employer]      Policy#: [id number]      Subscriber: patient CM Name: Case opened on [email or web address]       Phone#: n/a      Fax#: [phone] Pre-Cert#: [id number]       Employer:  Benefits:  Phone #: n/a     Name:  Eff. Date: 05/24/2019     Deduct:  $203 ($203 met)     Out of Pocket Max: $3,450 ($0 met)    Life Max: n/a CIR:  $2,524/admission co-pay   SNF:   100% coverage, 0% co-insurance; limited by medical necessity  Outpatient: 80% coverage, 20% co-insurance; visits limited by medical necessity      Co-Pay: 20% Home Health: 100% coverage, 0% co-insurance; limited by medical necessity    Co-Pay: 0% DME: : 80% coverage, 20% co-insurance    Co-Pay: 0% Providers:   SECONDARY: n/a     Policy#: n/a     Phone#: n/a  The "Data Collection Information Summary" for patients in Inpatient Rehabilitation Facilities with attached "Privacy Act [place] Records" was provided and verbally reviewed with: {CHL IP Patient Family DJ:[id number]}  Emergency Contact Information Contact Information    Name Relation Home Work [place] Sister [phone]  [phone]   [place]   [phone]      Current Medical History  Patient Admitting Diagnosis: Wound Dehiscence  History of Present Illness: Patient is a 44 year old woman who is s/p left transtibial amputation revision due to fall on residual limb with traumatic wound dehiscence. PMH significant for diabetic polyneuropathy associated with type 2 diabetes mellitus,   L BKA 2018, L BKA revision 08/2019 (approx 2 weeks ago), OSA on CPAP, DM 2, LE edema, cellulitis, acute low back pain, and fibromyalgia. Patient is on .Patient is on home amlodipine,bumetanide,metoprolol and lisinopril. However, patient has been persistently hypotensive (most recent BP of 96/53), so holding metoprolol and amlodipin.   Patient's medical record from [facility] has been reviewed by the rehabilitation admission coordinator and physician.  Past Medical History  Past Medical History:  Diagnosis Date  . Abdominal muscle pain 09/08/2016  . Abnormal Pap smear of cervix 2009  . Abscess    history of multiple abscesses  . Acute bilateral low back pain 02/13/2017  . Acute blood loss anemia   . Acute renal failure ([place]) 07/12/2012  . AKI (acute kidney injury) ([place])   . Anemia of chronic disease 2002  . Anxiety    Panic attacks  . Bilateral lower extremity edema 05/13/2016  . Bipolar disorder ([place])   . Cellulitis 05/21/2014   right eye  . Chronic bronchitis ([place])    "get it q yr" (05/13/2013)  . Chronic pain   . Depression   . Edema of lower extremity   . Endocarditis 2002   subacute bacterial endocarditis.   . Family history of anesthesia complication    "my mom has a hard time coming out from under"  . Fibromyalgia   . GERD (gastroesophageal reflux disease)    occ  . Heart murmur   . Herpes simplex type 1 infection 01/16/2018  . History of blood  transfusion    "just low blood count" (05/13/2013)  . Hyperlipidemia   . Hypertension   . Hypothyroidism   . Hypothyroidism, adult 03/21/2014  . Leukocytosis   . Necrosis ([place])    and ulceration  . Necrotizing fasciitis s/p OR debridements 07/06/2012   . Obesity   . OSA on CPAP    does not wear all the time  . Peripheral neuropathy   . Type II diabetes mellitus (HCC)    Type  II    Family History   family history includes Diabetes in her father, mother, and paternal grandmother; Heart failure in her mother and paternal grandmother; Kidney disease in her father and mother; Other in an other family member.  Prior Rehab/Hospitalizations Has the patient had prior rehab or hospitalizations prior to admission? Yes  Has the patient had major surgery during 100 days prior to admission? Yes   Current Medications  Current Facility-Administered Medications:  .  0.9 %  sodium chloride infusion (Manually program via Guardrails IV Fluids), , Intravenous, Once, Persons, Bevely Palmer, PA .  0.9 %  sodium chloride infusion (Manually program via Guardrails IV Fluids), , Intravenous, Once, Persons, Bevely Palmer, PA .  0.9 %  sodium chloride infusion, , Intravenous, Continuous, Asencion Noble, MD, Last Rate: 75 mL/hr at 09/23/19 1446, Rate Verify at 09/23/19 1446 .  acetaminophen (TYLENOL) tablet 650 mg, 650 mg, Oral, Q6H PRN, 650 mg at 09/20/19 2042 **OR** acetaminophen (TYLENOL) suppository 650 mg, 650 mg, Rectal, Q6H PRN, Persons, Bevely Palmer, PA .  amitriptyline (ELAVIL) tablet 50 mg, 50 mg, Oral, QHS, Persons, Bevely Palmer, [place], 50 mg at 09/22/19 2127 .  atorvastatin (LIPITOR) tablet 20 mg, 20 mg, Oral, Daily, Persons, Bevely Palmer, PA, 20 mg at 09/23/19 0920 .  bumetanide (BUMEX) tablet 1 mg, 1 mg, Oral, q1800, Persons, Bevely Palmer, PA, 1 mg at 09/22/19 1700 .  bumetanide (BUMEX) tablet 2 mg, 2 mg, Oral, Q24H, Persons, Bevely Palmer, [place], 2 mg at 09/22/19 [phone] .  Chlorhexidine Gluconate Cloth 2 % PADS 6 each, 6 each, Topical, Daily, Sid Falcon, MD, 6 each at 09/23/19 1020 .  cyclobenzaprine (FLEXERIL) tablet 5 mg, 5 mg, Oral, Daily PRN, Persons, Bevely Palmer, PA, 5 mg at 09/23/19 1303  .  docusate sodium (COLACE) capsule 100 mg, 100 mg, Oral, BID, Persons, Bevely Palmer, PA, 100 mg at 09/23/19 0920 .  FLUoxetine (PROZAC) capsule 60 mg, 60 mg, Oral, Daily, Persons, Bevely Palmer, [place], 60 mg at 09/23/19 0920 .  insulin aspart (novoLOG) injection 0-15 Units, 0-15 Units, Subcutaneous, TID WC, Persons, Bevely Palmer, [place], 2 Units at 09/23/19 [phone] .  insulin aspart (novoLOG) injection 8 Units, 8 Units, Subcutaneous, TID WC, Asencion Noble, MD, 8 Units at 09/23/19 1442 .  insulin glargine (LANTUS) injection 30 Units, 30 Units, Subcutaneous, QHS, Asencion Noble, MD, 30 Units at 09/22/19 2127 .  mupirocin ointment (BACTROBAN) 2 % 1 application, 1 application, Nasal, BID, Sid Falcon, MD, 1 application at 66/59/93 1020 .  nicotine (NICODERM CQ - dosed in mg/24 hours) patch 14 mg, 14 mg, Transdermal, Daily, Persons, Bevely Palmer, PA, 14 mg at 09/23/19 0924 .  ondansetron (ZOFRAN) tablet 4 mg, 4 mg, Oral, Q6H PRN **OR** ondansetron (ZOFRAN) injection 4 mg, 4 mg, Intravenous, Q6H PRN, Persons, Bevely Palmer, PA .  oxyCODONE (Oxy IR/ROXICODONE) immediate release tablet 5-10 mg, 5-10 mg, Oral, Q4H PRN, Persons, Bevely Palmer, PA, 10 mg at 09/23/19 1304 .  pregabalin (LYRICA) capsule 50 mg, 50  mg, Oral, TID, Persons, Bevely Palmer, [place], 50 mg at 09/23/19 0920 .  senna-docusate (Senokot-S) tablet 1 tablet, 1 tablet, Oral, BID, Asencion Noble, MD, 1 tablet at 09/23/19 0920 .  sodium zirconium cyclosilicate (LOKELMA) packet 10 g, 10 g, Oral, TID, Asencion Noble, MD, 10 g at 09/23/19 8590  Patients Current Diet:  Diet Order            Diet Carb Modified Fluid consistency: Thin; Room service appropriate? Yes  Diet effective now              Precautions / Restrictions Precautions Precautions: Fall, Other (comment) Precaution Comments: LLE wound vac Restrictions Weight Bearing Restrictions: Yes LLE Weight Bearing: Non weight bearing Other Position/Activity Restrictions: BKA revision    Has the patient had 2 or more falls or a fall with injury in the past year? Yes  Prior Activity Level Household: (independent with assistive devices) Community (5-7x/wk): Pt. reports going out in the community most days of the week  Prior Functional Level Self Care: Did the patient need help bathing, dressing, using the toilet or eating? Needed some help  Indoor Mobility: Did the patient need assistance with walking from room to room (with or without device)? Independent  Stairs: Did the patient need assistance with internal or external stairs (with or without device)? Independent  Functional Cognition: Did the patient need help planning regular tasks such as shopping or remembering to take medications? Independent  Development worker, international aid / Equipment Home Equipment: Wheelchair - manual, Environmental consultant - 2 wheels, [place] - single point, Crutches  Prior Device Use: Indicate devices/aids used by the patient prior to current illness, exacerbation or injury? Manual wheelchair and Walker  Current Functional Level Cognition  Overall Cognitive Status: Within Functional Limits for tasks assessed Orientation Level: Oriented X4 General Comments: pt motivated to keep UE strength, pt verbalized wanting to get out of bed more and verbalized benefits for OOB    Extremity Assessment (includes Sensation/Coordination)  Upper Extremity Assessment: Overall WFL for tasks assessed  Lower Extremity Assessment: LLE deficits/detail LLE Deficits / Details: s/p BKA revision, good hip ROM/strength, wound vac in place    ADLs  Overall ADL's : Needs assistance/impaired Eating/Feeding: Independent Grooming: Wash/dry hands, Wash/dry face, Oral care, Brushing hair, Modified independent Grooming Details (indicate cue type and reason): sitting up eob Upper Body Bathing: Modified independent, Sitting Lower Body Bathing: Set up, Sitting/lateral leans Upper Body Dressing : Modified independent, Sitting  Lower Body Dressing: Modified independent, Sitting/lateral leans Lower Body Dressing Details (indicate cue type and reason): able to bend down to access sock Toilet Transfer: Minimal assistance, Stand-pivot, RW, BSC Toilet Transfer Details (indicate cue type and reason): Stand pivot to [facility] minA for stability with initial stand Toileting- Clothing Manipulation and Hygiene: Minimal assistance, Sit to/from stand Toileting - Clothing Manipulation Details (indicate cue type and reason): pericare in standing with minA from therapist for support, no loss of balance noted Functional mobility during ADLs: Minimal assistance, Rolling walker General ADL Comments: educated pt on importance of transferring to [facility] more frequently during day instead of using purewik    Mobility  Overal bed mobility: Modified Independent General bed mobility comments: sitting in recliner upon arrival    Transfers  Overall transfer level: Needs assistance Equipment used: Rolling walker (2 wheeled) Transfers: Sit to/from Stand, [employer] Transfers Sit to Stand: Min assist Stand pivot transfers: Min assist General transfer comment: minA to powerup and pivot to [facility]    Ambulation / Gait / Stairs /  Wheelchair Mobility  Ambulation/Gait Ambulation/Gait assistance: Counsellor (Feet): 5 Feet Assistive device: Rolling walker (2 wheeled) General Gait Details: Able to hop on RLE bed to recliner with RW. Min guard assist for safety. Assist to manage IV pole and wound vac. Slow, guarded gait. Gait velocity: decreased    Posture / Balance Balance Overall balance assessment: Needs assistance Sitting-balance support: No upper extremity supported, Feet supported Sitting balance-Leahy Scale: Good Standing balance support: Single extremity supported, During functional activity Standing balance-Leahy Scale: Poor Standing balance comment: reliant on UE support     Special needs/care consideration Skin Surgical Incision of left stump  Diabetic management: yes Designated visitor ***   Previous Home Environment (from acute therapy documentation) Living Arrangements: Other relatives Available Help at Discharge: Family, Available PRN/intermittently Type of Home: House Home Layout: One level Home Access: Stairs to enter Entrance Stairs-Rails: None Entrance Stairs-Number of Steps: back porch - 2 Bathroom Shower/Tub: Government social research officer Accessibility: No Additional Comments: Pt reports lowering into bath tub to bathe, states there is no room for tub bench. concerns for safety with bathroom accessibility  Discharge Living Setting Plans for Discharge Living Setting: Patient's home Type of Home at Discharge: House Discharge Home Layout: One level Discharge Home Access: Stairs to enter Entrance Stairs-Rails: Can reach both Entrance Stairs-Number of Steps: 2(2) Discharge Bathroom Shower/Tub: Tub/shower unit Discharge Bathroom Toilet: Standard Discharge Bathroom Accessibility: Yes How Accessible: Accessible via walker Does the patient have any problems obtaining your medications?: No  Social/Family/Support Systems    Goals Patient/Family Goal for Rehab: PT Mod I; OT min assist Expected length of stay: 5-7 days(5-7 days) Pt/Family Agrees to Admission and willing to participate: Yes Program Orientation Provided & Reviewed with Pt/Caregiver Including Roles  & Responsibilities: Yes  Decrease burden of Care through IP rehab admission: Decrease number of caregivers, Bowel and bladder program and Patient/family education  Possible need for SNF placement upon discharge: Not anticipated   Patient Condition: I have reviewed medical records from [facility], spoken with {CHL IP CSW YW:[id number]}, and {CHL IP Patient Spouse Son Daughter Family Member:[id number]}. I {CHL IP at bedside or phone:304550005} for inpatient rehabilitation assessment.  Patient will benefit from ongoing {CHL IP PT OT SWN:[id number]}, can actively participate in 3 hours of therapy a day 5 days of the week, and can make measurable gains during the admission.  Patient will also benefit from the coordinated team approach during an Inpatient Acute Rehabilitation admission.  The patient will receive intensive therapy as well as Rehabilitation physician, nursing, social worker, and care management interventions.  Due to safety, skin/wound care, disease management, medication administration, pain management and patient education the patient requires 24 hour a day rehabilitation nursing.  The patient is currently *** with mobility and basic ADLs.  Discharge setting and therapy post discharge at [facility] IP discharge location:[id number]} is anticipated.  Patient has agreed to participate in the Acute Inpatient Rehabilitation Program and will admit {Time; today/tomorrow:10263}.  Preadmission Screen Completed By:  Genella Mech, 09/23/2019 4:46 PM ______________________________________________________________________   Discussed status with Dr. Marland Kitchen on *** at *** and received approval for admission today.  Admission Coordinator:  Genella Mech, CCC-SLP, time ***Sudie Grumbling ***   Assessment/Plan: Diagnosis: 1. Does the need for close, 24 hr/day Medical supervision in concert with the patient's rehab needs make it unreasonable for this patient to be served in a less intensive setting? {yes_no_potentially:3041433} 2. Co-Morbidities requiring supervision/potential complications: *** 3. Due to {due XF:8182993}, does the  patient require 24 hr/day rehab nursing? {yes_no_potentially:3041433}  4. Does the patient require coordinated care of a physician, rehab nurse, PT, OT, and SLP to address physical and functional deficits in the context of the above medical diagnosis(es)? {yes_no_potentially:3041433} Addressing deficits in the following areas: {deficits:3041436} 5. Can the patient actively participate in an intensive therapy program of at least 3 hrs of therapy 5 days a week? {yes_no_potentially:3041433} 6. The potential for patient to make measurable gains while on inpatient rehab is {potential:3041437} 7. Anticipated functional outcomes upon discharge from inpatient rehab: {functional outcomes:304600100} PT, {functional outcomes:304600100} OT, {functional outcomes:304600100} SLP 8. Estimated rehab length of stay to reach the above functional goals is: *** 9. Anticipated discharge destination: {anticipated dc setting:21604} 10. Overall Rehab/Functional Prognosis: {potential:3041437}   MD Signature: ***

## 2019-09-23 NOTE — Progress Notes (Signed)
Occupational Therapy Treatment Patient Details Name: [name] [name] MRN: [id number] DOB: 05-09-1976 Today's Date: 09/23/2019    History of present illness Patient is a 44 year old woman who is s/p left transtibial amputation revision due to fall on residual limb with traumatic wound dehiscence. PMH significant of L BKA 2018, L BKA revision 08/2019 (approx 2 weeks ago), OSA on CPAP, DM 2, LE edema, cellulitis, acute low back pain, and fibromyalgia.   OT comments  Pt progressing toward established goals. Pt currently requires minA for stand-pivot transfer to [facility] and minA for support while pt completed toileting hygiene in standing. Pt verbalized understanding of benefits of being out of bed and stated she wants to "maintain her upper body strength". Provided pt with theraband for general UE strengthening. Pt will continue to benefit from skilled OT services to maximize safety and independence with ADL/IADL and functional mobility. Will continue to follow acutely and progress as tolerated.      Follow Up Recommendations  Supervision/Assistance - 24 hour;CIR    Equipment Recommendations  Tub/shower bench    Recommendations for Other Services      Precautions / Restrictions Precautions Precautions: Fall;Other (comment) Precaution Comments: LLE wound vac Restrictions Weight Bearing Restrictions: Yes LLE Weight Bearing: Non weight bearing Other Position/Activity Restrictions: BKA revision       Mobility Bed Mobility               General bed mobility comments: sitting in recliner upon arrival  Transfers Overall transfer level: Needs assistance Equipment used: Rolling walker (2 wheeled) Transfers: Sit to/from [facility] Sit to Stand: Min assist Stand pivot transfers: Min assist       General transfer comment: minA to powerup and pivot to [facility]    Balance Overall balance assessment: Needs assistance Sitting-balance support: No upper extremity  supported;Feet supported Sitting balance-Leahy Scale: Good     Standing balance support: Single extremity supported;During functional activity Standing balance-Leahy Scale: Poor Standing balance comment: reliant on UE support                           ADL either performed or assessed with clinical judgement   ADL Overall ADL's : Needs assistance/impaired                     Lower Body Dressing: Modified independent;Sitting/lateral leans Lower Body Dressing Details (indicate cue type and reason): able to bend down to access sock Toilet Transfer: Minimal assistance;Stand-pivot;RW;BSC Toilet Transfer Details (indicate cue type and reason): Stand pivot to [facility] minA for stability with initial stand Toileting- Clothing Manipulation and Hygiene: Minimal assistance;Sit to/from stand Toileting - Clothing Manipulation Details (indicate cue type and reason): pericare in standing with minA from therapist for support, no loss of balance noted     Functional mobility during ADLs: Minimal assistance;Rolling walker General ADL Comments: educated pt on importance of transferring to [facility] more frequently during day instead of using purewik     Vision       Perception     Praxis      Cognition Arousal/Alertness: Awake/alert Behavior During Therapy: WFL for tasks assessed/performed Overall Cognitive Status: Within Functional Limits for tasks assessed                                 General Comments: pt motivated to keep UE strength, pt verbalized wanting to get out of bed more  and verbalized benefits for OOB        Exercises Exercises: Other exercises Other Exercises Other Exercises: provided pt with theraband for general UE exercises   Shoulder Instructions       General Comments wound vac in place    Pertinent Vitals/ Pain       Pain Assessment: 0-10 Pain Score: 7  Pain Location: LLE and abdomen Pain Descriptors / Indicators:  Grimacing;Discomfort;Sore;Spasm;Cramping Pain Intervention(s): Limited activity within patient's tolerance;Monitored during session  Home Living                                          Prior Functioning/Environment              Frequency  Min 2X/week        Progress Toward Goals  OT Goals(current goals can now be found in the care plan section)  Progress towards OT goals: Progressing toward goals  Acute Rehab OT Goals Patient Stated Goal: to maintain UE strength OT Goal Formulation: With patient Time For Goal Achievement: 10/05/19 Potential to Achieve Goals: Fair ADL Goals Pt Will Transfer to Toilet: with modified independence;ambulating;bedside commode Pt Will Perform Tub/Shower Transfer: with modified independence;tub bench;rolling walker;Stand pivot transfer  Plan Discharge plan remains appropriate    Co-evaluation                 AM-PAC OT "6 Clicks" Daily Activity     Outcome Measure   Help from another person eating meals?: A Little Help from another person taking care of personal grooming?: A Little Help from another person toileting, which includes using toliet, bedpan, or urinal?: A Little Help from another person bathing (including washing, rinsing, drying)?: A Little Help from another person to put on and taking off regular upper body clothing?: A Little Help from another person to put on and taking off regular lower body clothing?: A Little 6 Click Score: 18    End of Session Equipment Utilized During Treatment: Gait belt;Rolling walker  OT Visit Diagnosis: Unsteadiness on feet (R26.81);Pain;History of falling (Z91.81) Pain - Right/Left: Left Pain - part of body: Leg   Activity Tolerance Patient tolerated treatment well   Patient Left in chair;with call bell/phone within reach;with chair alarm set   Nurse Communication Mobility status;Precautions        Time: 5537-4827 OT Time Calculation (min): 22 min  Charges:  OT General Charges $OT Visit: 1 Visit OT Treatments $Self Care/Home Management : 8-22 mins  Helene Kelp OTR/L Acute Rehabilitation Services Office: [place] 09/23/2019, 3:28 PM

## 2019-09-23 NOTE — Plan of Care (Signed)

## 2019-09-24 LAB — CBC
HCT: 28 % — ABNORMAL LOW (ref 36.0–46.0)
Hemoglobin: 8.7 g/dL — ABNORMAL LOW (ref 12.0–15.0)
MCH: 27 pg (ref 26.0–34.0)
MCHC: 31.1 g/dL (ref 30.0–36.0)
MCV: 87 fL (ref 80.0–100.0)
Platelets: 252 10*3/uL (ref 150–400)
RBC: 3.22 MIL/uL — ABNORMAL LOW (ref 3.87–5.11)
RDW: 14.4 % (ref 11.5–15.5)
WBC: 6.2 10*3/uL (ref 4.0–10.5)
nRBC: 0 % (ref 0.0–0.2)

## 2019-09-24 LAB — BASIC METABOLIC PANEL
Anion gap: 9 (ref 5–15)
BUN: 72 mg/dL — ABNORMAL HIGH (ref 6–20)
CO2: 16 mmol/L — ABNORMAL LOW (ref 22–32)
Calcium: 8.1 mg/dL — ABNORMAL LOW (ref 8.9–10.3)
Chloride: 107 mmol/L (ref 98–111)
Creatinine, Ser: 2.88 mg/dL — ABNORMAL HIGH (ref 0.44–1.00)
GFR calc Af Amer: 22 mL/min — ABNORMAL LOW (ref >60)
GFR calc non Af Amer: 19 mL/min — ABNORMAL LOW (ref >60)
Glucose, Bld: 148 mg/dL — ABNORMAL HIGH (ref 70–99)
Potassium: 5.6 mmol/L — ABNORMAL HIGH (ref 3.5–5.1)
Sodium: 132 mmol/L — ABNORMAL LOW (ref 135–145)

## 2019-09-24 LAB — GLUCOSE, CAPILLARY
Glucose-Capillary: 148 mg/dL — ABNORMAL HIGH (ref 70–99)
Glucose-Capillary: 151 mg/dL — ABNORMAL HIGH (ref 70–99)
Glucose-Capillary: 83 mg/dL (ref 70–99)
Glucose-Capillary: 127 mg/dL — ABNORMAL HIGH (ref 70–99)

## 2019-09-24 LAB — TECHNOLOGIST SMEAR REVIEW

## 2019-09-24 LAB — LACTATE DEHYDROGENASE: LDH: 129 U/L (ref 98–192)

## 2019-09-24 LAB — SAVE SMEAR(SSMR), FOR PROVIDER SLIDE REVIEW

## 2019-09-24 MED ORDER — ACETAMINOPHEN 650 MG RE SUPP: 650.0000 mg | Freq: Four times a day (QID) | RECTAL | Status: DC

## 2019-09-24 MED ORDER — SODIUM BICARBONATE 650 MG PO TABS
650.0000 mg | ORAL_TABLET | Freq: Two times a day (BID) | ORAL | Status: DC
  Administered 2019-09-24 – 2019-09-26 (×5): 650 mg via ORAL
  Filled 2019-09-24 (×5): qty 1

## 2019-09-24 MED ORDER — ACETAMINOPHEN 325 MG PO TABS
650.0000 mg | ORAL_TABLET | Freq: Four times a day (QID) | ORAL | Status: DC
  Administered 2019-09-24 – 2019-09-26 (×8): 650 mg via ORAL
  Filled 2019-09-24 (×9): qty 2

## 2019-09-24 MED ORDER — HYDROXYZINE HCL 25 MG PO TABS: 25.0000 mg | ORAL_TABLET | Freq: Four times a day (QID) | ORAL | Status: DC | PRN

## 2019-09-24 NOTE — Progress Notes (Addendum)
   Subjective:  The patient reports that she is doing fine. She reports that the pain in her stump is still present despite oxycodone regimen. She otherwise has no other issues at this time.  Objective:  Vital signs in last 24 hours: Vitals:   09/23/19 1701 09/23/19 1955 09/24/19 0500 09/24/19 1029  BP: (!) 98/54 114/60 110/62 120/66  Pulse: 79 84 89 85  Resp:  18 18 15   Temp:  98.2 F (36.8 C) 98.5 F (36.9 C) (!) 97.2 F (36.2 C)  TempSrc:  Oral Oral Oral  SpO2:  98% 97%    General: Middle aged female, NAD, laying in bed Cardiac: RRR, no m/r/g; no JVD Pulmonary: CTABL, no wheezing, rhonchi, rales Abdominal: Mildly distended; normoactive bowel sounds. No TTP. Extremities: Left BKA, wound vac in place with minimal drainage    Assessment/Plan:  [name] [name] is a 44 y.o. female with a history of left leg osteomyelitis status post BKA in 2018 with recent revisions and April 2021, hypertension, hyperlipidemia, fibromyalgia, diabetes, HFpEF, anemia of chronic disease, and hypothyroidism who presented after falling on her wound causing wound dehiscence.  Active Problems:   Diabetic polyneuropathy associated with type 2 diabetes mellitus ([place])   Fall   Wound dehiscence  Left leg BKA wound dehiscence: Status post wound revision of BKA on 09/20/2019. Wound vac with minimal output. Hgb is 8.7 today; no need for further blood transfusions at this time. There have been ongoing discussions with PT/OT about their recommendations for transfer to CIR, and patient is amenable to this plan.  -Orthopedics following, appreciate recommendations -Continue pain control with oxycodone and scheduled Tylenol -Continue PT/OT, recommending CIR  Diabetes mellitus: -Glucose of 148 today, ranging from 120s to 170s in the past 48 hours on SSI, Lantus, and Novolog regimens. Continue current regimens at this time. -Continue SSI -Continue basal coverage with Lantus 30 units QHS -Continue Novolog, 8  units, TID for mealtime coverage -Monitor CBGs  Hyperkalemia:  CKD stage III: -K has been persistently elevated following her procedure on 04/30. Today, her potassium is 5.6. Prior to her procedure it was 5.2. EKG showed no evidence of peaked T waves. Unclear etiology, but potential causes include underlying CKD, hyperglycemia, hemolytic anemia 2/2 cephalosporin treatment, and recent surgery. At this time, will begin sodium bicarbonate therapy, 650 mg BID for a goal bicarb of 20-24. Have also placed orders for LDH, haptoglobin, peripheral smear given intravascular hemolysis is on the differential. Will also continue to treat hyperkalemia with Lokelma. -Lokelma 2 packets -Sodium bicarbonate, 650 mg BID, goal bicarb of 20-24 -Trend BMP -LDH, peripheral smear, haptoglobin pending  Constipation -Senna-docusate, 2 packets  HTN: Patient is on home amlodipine, bumetanide, metoprolol and lisinopril. Most recent BP of 120/66, up from 38S-505L systolic. We will continue to hold amlodipine and metoprolol in the setting of recent hypotension. -Holding home amlodipine and metoprolol   HLD: - Continue home lipitor   Fibromyalgia: - Continue home amitriptyline, oxycodone, flexeril and lyrica   HFpEF: - Continue home BP meds as above.  Prior to Admission Living Arrangement: Home Anticipated Discharge Location: CIR Barriers to Discharge: Pending CIR placement Dispo: Anticipated discharge in approximately 1-2 day(s).   Orvis Brill, Medical Student 09/24/2019, 11:11 AM Pager: [phone]

## 2019-09-24 NOTE — Plan of Care (Signed)
  Problem: Health Behavior/Discharge Planning: Goal: Ability to manage health-related needs will improve Outcome: Progressing   Problem: Clinical Measurements: Goal: Ability to maintain clinical measurements within normal limits will improve Outcome: Progressing   Problem: Activity: Goal: Risk for activity intolerance will decrease Outcome: Progressing   Problem: Nutrition: Goal: Adequate nutrition will be maintained Outcome: Progressing   Problem: Coping: Goal: Level of anxiety will decrease Outcome: Progressing   Problem: Elimination: Goal: Will not experience complications related to bowel motility Outcome: Progressing   Problem: Pain Managment: Goal: General experience of comfort will improve Outcome: Progressing   Problem: Safety: Goal: Ability to remain free from injury will improve Outcome: Progressing   

## 2019-09-24 NOTE — TOC Initial Note (Signed)
Transition of Care [facility]) - Initial/Assessment Note    Patient Details  Name: [name] [name] MRN: [id number] Date of Birth: 27-Apr-1976  [facility] CM/SW Contact:    Sharin Mons, RN Phone Number: [phone] 09/24/2019, 1:30 PM  Clinical Narrative:      Readmitted s/p fall, suffered traumatic wound dehiscence to left transtibial amputation. PMH :L BKA 2018, L BKA revision 08/2019, OSA on CPAP, DM 2, LE edema, cellulitis, acute low back pain, and fibromyalgia. From home with family ( grandmother, uncle and niece). PTA active with  [facility] ( home health) and [facility]  61 hrs/ month    - S/p revision of L transtibial amputation, 4/30 PT's recommendations: CIR;Supervision for mobility/OOB.  CIR following ....  Placement pending insurance authorization, authorization in process  [facility] team will continue to monitor and follow.  Expected Discharge Plan: [place] Barriers to Discharge: No Barriers Identified   Patient Goals and CMS Choice        Expected Discharge Plan and Services Expected Discharge Plan: [place]   Prior Living Arrangements/Services     Activities of Daily Living      Permission Sought/Granted       Emotional Assessment              Admission diagnosis:  Wound dehiscence [T81.30XA] Fall, initial encounter [W19.XXXA] Patient Active Problem List   Diagnosis Date Noted  . Wound dehiscence 09/04/2019  . Right carpal tunnel syndrome 06/14/2019  . Left carpal tunnel syndrome 06/14/2019  . Tobacco use 03/01/2019  . Phantom limb pain ([place]) 11/08/2018  . Current moderate episode of major depressive disorder ([place])   . Anemia of chronic disease   . Severe protein-calorie malnutrition ([place])   . Dehiscence of amputation stump ([place])   . Fall   . Status post below knee amputation, left ([place]) 04/11/2017  . Bipolar affective disorder ([place])   . Adjustment disorder with depressed mood 03/26/2017  . Heart failure  with preserved ejection fraction ([place]) 02/09/2017  . Routine adult health maintenance 12/08/2016  . Chronic pain of right knee 09/08/2016  . Chronic kidney disease (CKD), stage III (moderate) 07/15/2016  . OSA (obstructive sleep apnea) 05/13/2016  . Morbid obesity due to excess calories ([place]) 11/27/2015  . Hypothyroidism 04/25/2013  . Controlled type 2 diabetes mellitus ([place]) 07/12/2012  . Carpal tunnel syndrome, bilateral 01/25/2012  . Diabetic polyneuropathy associated with type 2 diabetes mellitus ([place]) 07/04/2011  . Anxiety and depression 06/02/2011  . GERD 12/06/2007  . Hyperlipidemia 08/29/2007  . Hypertension associated with diabetes ([place]) 08/29/2007   PCP:  Ina Homes, MD Pharmacy:   [facility] [place], [place] - [address] [place] [address] Phone: [phone] Fax: [phone]     Social Determinants of Health (SDOH) Interventions    Readmission Risk Interventions No flowsheet data found.

## 2019-09-24 NOTE — Progress Notes (Signed)
Physical Therapy Treatment Patient Details Name: [name] [name] MRN: [id number] DOB: [birth year] Today's Date: 09/24/2019    History of Present Illness Patient is a 44 year old woman who is s/p left transtibial amputation revision due to fall on residual limb with traumatic wound dehiscence. PMH significant of L BKA 2018, L BKA revision 08/2019 (approx 2 weeks ago), OSA on CPAP, DM 2, LE edema, cellulitis, acute low back pain, and fibromyalgia.    PT Comments    Pt concerned today about swelling BLE's and how it is affecting her function, specifically, she feels that she will be unable to hop up steps into her home. Current foot clearance with hopping is only 1-1.5 inches. Practiced hopping in standing 10+ times today. Pt requiring min-guard A for transfers, min A for gait 5'. PT will continue to follow.    Follow Up Recommendations  CIR;Supervision for mobility/OOB     Equipment Recommendations  None recommended by PT    Recommendations for Other Services       Precautions / Restrictions Precautions Precautions: Fall;Other (comment) Precaution Comments: LLE wound vac Restrictions Weight Bearing Restrictions: Yes LLE Weight Bearing: Non weight bearing Other Position/Activity Restrictions: BKA revision    Mobility  Bed Mobility Overal bed mobility: Modified Independent             General bed mobility comments: pt lying asleep with RLE hanging off bed, easily aroused and corrected position independently but discussed risk of falling OOB  Transfers Overall transfer level: Needs assistance Equipment used: Rolling walker (2 wheeled) Transfers: Sit to/from Stand Sit to Stand: Min guard         General transfer comment: performed 5x (1 from bed, 4 from recliner), min-guard each time, for strengthening  Ambulation/Gait Ambulation/Gait assistance: Min guard Gait Distance (Feet): 5 Feet Assistive device: Rolling walker (2 wheeled) Gait Pattern/deviations:  (hopping) Gait velocity: decreased Gait velocity interpretation: <1.31 ft/sec, indicative of household ambulator General Gait Details: very low hop height today, pt reports she can't get her normal elevation due to swelling BLE's   Stairs             Wheelchair Mobility    Modified Rankin (Stroke Patients Only)       Balance Overall balance assessment: Needs assistance Sitting-balance support: No upper extremity supported;Feet supported Sitting balance-Leahy Scale: Good     Standing balance support: Single extremity supported;During functional activity Standing balance-Leahy Scale: Poor Standing balance comment: reliant on UE support                            Cognition Arousal/Alertness: Awake/alert Behavior During Therapy: WFL for tasks assessed/performed Overall Cognitive Status: Within Functional Limits for tasks assessed                                        Exercises Amputee Exercises Quad Sets: AROM;Both;10 reps;Seated Hip Extension: AROM;Left;Standing;10 reps Hip Flexion/Marching: AROM;Left;10 reps;Standing Knee Flexion: AROM;Left;10 reps;Seated Straight Leg Raises: AROM;Left;10 reps;Supine Chair Push Up: AROM;10 reps Other Exercises Other Exercises: hopping in place in standing 10x, 2 bouts    General Comments General comments (skin integrity, edema, etc.): pt very concerned about amount of swelling BLE's as it is affecting function      Pertinent Vitals/Pain Pain Assessment: Faces Faces Pain Scale: Hurts whole lot Pain Location: LLE and abdomen Pain Descriptors / Indicators: Grimacing;Discomfort;Sore Pain Intervention(s): Limited  activity within patient's tolerance;Monitored during session;Premedicated before session    Home Living                      Prior Function            PT Goals (current goals can now be found in the care plan section) Acute Rehab PT Goals Patient Stated Goal: to maintain UE  strength PT Goal Formulation: With patient Time For Goal Achievement: 10/06/19 Potential to Achieve Goals: Good Progress towards PT goals: Progressing toward goals    Frequency    Min 3X/week      PT Plan Current plan remains appropriate    Co-evaluation              AM-PAC PT "6 Clicks" Mobility   Outcome Measure  Help needed turning from your back to your side while in a flat bed without using bedrails?: None Help needed moving from lying on your back to sitting on the side of a flat bed without using bedrails?: None Help needed moving to and from a bed to a chair (including a wheelchair)?: A Little Help needed standing up from a chair using your arms (e.g., wheelchair or bedside chair)?: A Little Help needed to walk in hospital room?: A Little Help needed climbing 3-5 steps with a railing? : A Lot 6 Click Score: 19    End of Session Equipment Utilized During Treatment: (wound vac) Activity Tolerance: Patient tolerated treatment well Patient left: in chair;with call bell/phone within reach;with chair alarm set Nurse Communication: Mobility status PT Visit Diagnosis: Other abnormalities of gait and mobility (R26.89);Difficulty in walking, not elsewhere classified (R26.2)     Time: 4680-3212 PT Time Calculation (min) (ACUTE ONLY): 36 min  Charges:  $Gait Training: 8-22 mins $Therapeutic Exercise: 8-22 mins                     Leighton Roach, PT  Acute Rehab Services  Pager [phone] Office [place] 09/24/2019, 10:41 AM

## 2019-09-25 ENCOUNTER — Encounter (HOSPITAL_COMMUNITY): Payer: Self-pay | Admitting: Internal Medicine

## 2019-09-25 LAB — CBC
HCT: 24.4 % — ABNORMAL LOW (ref 36.0–46.0)
Hemoglobin: 7.7 g/dL — ABNORMAL LOW (ref 12.0–15.0)
MCH: 26.6 pg (ref 26.0–34.0)
MCHC: 31.6 g/dL (ref 30.0–36.0)
MCV: 84.4 fL (ref 80.0–100.0)
Platelets: 262 10*3/uL (ref 150–400)
RBC: 2.89 MIL/uL — ABNORMAL LOW (ref 3.87–5.11)
RDW: 14.2 % (ref 11.5–15.5)
WBC: 4.6 10*3/uL (ref 4.0–10.5)
nRBC: 0 % (ref 0.0–0.2)

## 2019-09-25 LAB — BASIC METABOLIC PANEL
Anion gap: 12 (ref 5–15)
BUN: 72 mg/dL — ABNORMAL HIGH (ref 6–20)
CO2: 19 mmol/L — ABNORMAL LOW (ref 22–32)
Calcium: 8.6 mg/dL — ABNORMAL LOW (ref 8.9–10.3)
Chloride: 103 mmol/L (ref 98–111)
Creatinine, Ser: 2.59 mg/dL — ABNORMAL HIGH (ref 0.44–1.00)
GFR calc Af Amer: 25 mL/min — ABNORMAL LOW (ref >60)
GFR calc non Af Amer: 22 mL/min — ABNORMAL LOW (ref >60)
Glucose, Bld: 154 mg/dL — ABNORMAL HIGH (ref 70–99)
Potassium: 5.7 mmol/L — ABNORMAL HIGH (ref 3.5–5.1)
Sodium: 134 mmol/L — ABNORMAL LOW (ref 135–145)

## 2019-09-25 LAB — HAPTOGLOBIN: Haptoglobin: 211 mg/dL (ref 42–296)

## 2019-09-25 LAB — PATHOLOGIST SMEAR REVIEW

## 2019-09-25 LAB — GLUCOSE, CAPILLARY
Glucose-Capillary: 143 mg/dL — ABNORMAL HIGH (ref 70–99)
Glucose-Capillary: 186 mg/dL — ABNORMAL HIGH (ref 70–99)
Glucose-Capillary: 97 mg/dL (ref 70–99)
Glucose-Capillary: 83 mg/dL (ref 70–99)

## 2019-09-25 MED ORDER — OXYCODONE HCL 5 MG PO TABS
5.0000 mg | ORAL_TABLET | Freq: Once | ORAL | Status: AC
  Administered 2019-09-25: 5 mg via ORAL

## 2019-09-25 MED ORDER — SODIUM ZIRCONIUM CYCLOSILICATE 10 G PO PACK
10.0000 g | PACK | Freq: Two times a day (BID) | ORAL | Status: DC
  Administered 2019-09-25 – 2019-09-26 (×3): 10 g via ORAL
  Filled 2019-09-25 (×3): qty 1

## 2019-09-25 NOTE — Progress Notes (Signed)
Subjective:  The patient reports that she is doing fine. She reports that the pain in her stump is still present despite her current regimen for pain control. She reports that she has been urinating more frequently and notes that she feels less distended. Reports that nicotine patches help somewhat, but she still has intense cravings for cigarettes. She is amenable to various options for assistance quitting. There are no other complaints or concerns at this time.  Objective:  Vital signs in last 24 hours: Vitals:   09/24/19 1029 09/24/19 1242 09/24/19 1924 09/25/19 0500  BP: 120/66 122/63 122/73 122/70  Pulse: 85 82 88 86  Resp: 15 17 18 18   Temp: (!) 97.2 F (36.2 C) (!) 97.1 F (36.2 C) (!) 97.5 F (36.4 C) 98.1 F (36.7 C)  TempSrc: Oral  Oral Oral  SpO2:  100% 99% 99%   General: Middle aged female, NAD, laying in bed Cardiac: RRR, no m/r/g; no JVD Pulmonary: CTABL, no wheezing, rhonchi, rales Abdominal: Mildly distended; normoactive bowel sounds.  Extremities: Left BKA, wound vac in place with minimal drainage    Assessment/Plan:  [name] [name] is a 44 y.o. female with a history of left leg osteomyelitis status post BKA in 2018 with recent revisions and April 2021, hypertension, hyperlipidemia, fibromyalgia, diabetes, HFpEF, anemia of chronic disease, and hypothyroidism who presented after falling on her wound causing wound dehiscence.  Active Problems:   Diabetic polyneuropathy associated with type 2 diabetes mellitus ([place])   Fall   Wound dehiscence  Left leg BKA wound dehiscence: Status post wound revision of BKA on 09/20/2019. Wound vac with minimal output. Hgb is 8.7 today; no need for further blood transfusions at this time. Plan for transfer to CIR pending insurance approval, peer to peer review  -Orthopedics following, appreciate recommendations -Continue pain control with oxycodone and scheduled Tylenol -Continue PT/OT -Plan for CIR pending insurance  approval and peer to peer review  Diabetes mellitus: -Glucose of 186 today, ranging from 120s to 180s in the past 72 hours on SSI, Lantus, and Novolog regimens. Continue current regimens at this time. -Continue SSI -Continue basal coverage with Lantus 30 units QHS -Continue Novolog, 8 units, TID for mealtime coverage -Monitor CBGs  Hyperkalemia:  CKD stage III: -K has been persistently elevated following her procedure on 04/30. Today, her potassium is 5.7. Prior to her procedure it was 5.2. EKG showed no evidence of peaked T waves. Unclear etiology, but potential causes include underlying CKD, hyperglycemia, RTA type 4, and rhabdomyolysis. Bicarb 19 today from 59 yesterday. Will continue sodium bicarbonate therapy, 650 mg BID for a goal bicarb of 20-24. Will also continue to treat hyperkalemia with Milly Jakob. -Lokelma 2 packets -Sodium bicarbonate, 650 mg BID, goal bicarb of 20-24 -Trend BMP -CK, UA, renin/aldosterone pending  Constipation -Senna-docusate, 2 packets  HTN: Patient is on home amlodipine, bumetanide, metoprolol and lisinopril. Most recent BP of 122/70, up from 63K-160F systolic. We will continue to hold amlodipine and metoprolol in the setting of recent hypotension. -Holding home amlodipine and metoprolol   HLD: - Continue home lipitor   Fibromyalgia: - Continue home amitriptyline, oxycodone, flexeril and lyrica   HFpEF: - Continue home BP meds as above.  Tobacco Use: Patient was counseled on importance of tobacco cessation today.The patient is interested in obtaining resources for assistance in quitting smoking.  -Continue nicotine replacement inpatient  Prior to Admission Living Arrangement: Home Anticipated Discharge Location: CIR versus SNF Barriers to Discharge: Pending CIR placement Dispo: Anticipated discharge tomorrow  Orvis Brill, Medical Student 09/25/2019, 10:53 AM Pager: [phone]

## 2019-09-25 NOTE — Discharge Summary (Signed)
Name: [name] [name] MRN: [id number] DOB: March 19, 1976 44 y.o. PCP: Ina Homes, MD  Date of Admission: 09/18/2019  5:02 PM Date of Discharge:  No discharge date for patient encounter. Attending Physician: Sid Falcon, MD  Discharge Diagnosis: 1. Left leg BKA wound dehiscence 2. HTN 3. CKD stage III, hyperkalemia 3. HLD 4. Fibromylagia 5. Anemia of Chronic Disease 6. Tobacco Use:   Discharge Medications: Allergies as of 09/26/2019      Reactions   Vancomycin Other (See Comments)   Acute renal failure suspected secondary to vanco      Medication List    STOP taking these medications   aluminum chloride 20 % external solution Commonly known as: Drysol   amLODipine 10 MG tablet Commonly known as: NORVASC   aspirin 325 MG EC tablet   lisinopril 10 MG tablet Commonly known as: ZESTRIL   metoprolol tartrate 25 MG tablet Commonly known as: LOPRESSOR     TAKE these medications   allopurinol 100 MG tablet Commonly known as: Zyloprim Take 1 tablet (100 mg total) by mouth 2 (two) times daily.   amitriptyline 50 MG tablet Commonly known as: ELAVIL Take 1 tablet (50 mg total) by mouth at bedtime.   atorvastatin 20 MG tablet Commonly known as: LIPITOR Take 1 tablet (20 mg total) by mouth daily.   blood glucose meter kit and supplies Kit ICD 10- E11.69. Based on patient and insurance preference. Use up to four times daily as directed.   bumetanide 1 MG tablet Commonly known as: BUMEX TAKE 1 TABLET BY MOUTH 2 TIMES DAILY What changed:   when to take this  additional instructions   Colchicine 0.6 MG Caps Take 0.6 mg by mouth daily. What changed:   when to take this  reasons to take this   cyclobenzaprine 5 MG tablet Commonly known as: FLEXERIL Take 1 tablet (5 mg total) by mouth daily as needed for muscle spasms.   diclofenac Sodium 1 % Gel Commonly known as: VOLTAREN Apply 1 application topically 4 (four) times daily as needed (pain).     FLUoxetine 20 MG/5ML solution Commonly known as: PROZAC Take 15 mLs (60 mg total) by mouth daily.   hydrOXYzine 25 MG tablet Commonly known as: ATARAX/VISTARIL TAKE 1 TABLET BY MOUTH EVERY SIX HOURS AS NEEDED FOR ANXIETY What changed: See the new instructions.   mupirocin ointment 2 % Commonly known as: BACTROBAN Apply 1 application topically 2 (two) times daily.   nystatin powder Commonly known as: MYCOSTATIN/NYSTOP Apply topically 4 times daily. After cleaning and drying the affected area for two weeks What changed: See the new instructions.   oxyCODONE 5 MG immediate release tablet Commonly known as: Oxy IR/ROXICODONE Take 1-2 tablets (5-10 mg total) by mouth every 4 (four) hours as needed for moderate pain (pain score 4-6).   oxyCODONE-acetaminophen 5-325 MG tablet Commonly known as: Percocet Take 1 tablet by mouth every 4 (four) hours as needed for severe pain.   pregabalin 50 MG capsule Commonly known as: LYRICA Take 1 capsule (50 mg total) by mouth 2 (two) times daily. What changed: when to take this   sodium bicarbonate 650 MG tablet Take 1 tablet (650 mg total) by mouth 2 (two) times daily.   sodium zirconium cyclosilicate 10 g Pack packet Commonly known as: LOKELMA Take 10 g by mouth 2 (two) times daily.   terbinafine 250 MG tablet Commonly known as: LamISIL Take 1 tablet (250 mg total) by mouth daily.   tetrahydrozoline 0.05 %  ophthalmic solution Place 2 drops into both eyes 2 (two) times daily as needed (red/ dry eyes). Alcon   TRUEplus Pen Needles 31G X 5 MM Misc Generic drug: Insulin Pen Needle Use pen needle with insulin 3 times daily What changed: See the new instructions.   Victoza 18 MG/3ML Sopn Generic drug: liraglutide Inject 0.3 mLs (1.8 mg total) below the skin daily. What changed: See the new instructions.            Durable Medical Equipment  (From admission, onward)         Start     Ordered   09/26/19 1431  For home use  only DME Other see comment  Once    Comments: 30 inch  Question:  Length of Need  Answer:  Lifetime   09/26/19 1430   09/26/19 1428  DME 3-in-1  Once     09/26/19 1428   09/26/19 1428  DME Tub Bench  Once     09/26/19 1428   09/26/19 1324  For home use only DME Tub bench  Once     09/26/19 1323   09/26/19 1323  For home use only DME 3 n 1  Once     09/26/19 1323          Disposition and follow-up:   Ms.Stephannie C Fitz was discharged from [facility] in Stable condition.  At the hospital follow up visit please address:  1.  Left leg BKA wound dehiscence: Please assess pain management and ensure home health services have been occurring.  Please ensure follow-up with orthopedics.  Hypertension: Patient developed hypotension during admission, amlodipine, lisinopril, and metoprolol were held, resume as needed.  CKD stage III, hyperkalemia: Patient noted to be hyperkalemic with a low bicarb on admission, started on sodium bicarb and Lokelma.  Patient needs to follow-up with nephrology.   2.  Labs / imaging needed at time of follow-up: CBC, BMP  3.  Pending labs/ test needing follow-up:   Follow-up Appointments:  Contact information for follow-up providers    Suzan Slick, NP In 1 week.   Specialty: Orthopedic Surgery Contact information: [place] [address] [phone]        Care, [facility] Follow up.   Specialty: Home Health Services Contact information: [place] [address] [phone]        Ina Homes, MD. Schedule an appointment as soon as possible for a visit in 1 week(s).   Specialty: Internal Medicine Contact information: [address] [place]  [address] [phone]            Contact information for after-discharge care    [facility] .   Service: Skilled Nursing Contact information: [place]  [place] [facility] Course by problem list: 1. Left leg BKA wound dehiscence: Patient was in her usual state of health until 04/28 when she tripped on the parking block and hit the end of her stump. She notes the stitches opened up and her wound began to bleed. Clinical findings on admission were consistent with wound dehiscence after trauma to her stump on 04/28. Throughout her stay, she has been afebrile and without elevations in her WBC. Additionally, her stump did not appear acutely infected. Orthopedics is on board and is planning for revision of the below-knee  amputation this afternoon. Orthopedics was consulted and recommended BKA revision, which was performed on 94/17 without complication. On 05/01, there was significant output from her wound of 2.1 L; at that time, her Hgb dropped to 6.4 (likely from her wound) for which she was given PRBCs. PT/OT did recommend CIR however patient did not qualify, discharged home with [place]. Plan to follow up with orthopedics o/p. No changes to her home pain medications were made.   2. HTN Patient's home BP regimen was continued throughout her hospital stay, patient developed hypotension during admission and her amlodipine, lisinopril, and metoprolol were held on discharge.  3. CKD stage III, hyperkalemia: Patient has a history of CKD that has slowly been worsening, patient noted to be hyperkalemic with a low bicarb during hospitalization, started on sodium bicarb and Lokelma.  Patient needs to follow-up with nephrology.  3. HLD Patient's Lipitor regimen was continued throughout her hospital stay.  4. Fibromylagia Patient's home regimen was continued throughout her stay.  5. Anemia of Chronic Disease. Hgb 7.3 on presentation, down from 8-8.4 a few weeks ago. This was most likely due to acute blood loss from her wound dehiscence. On 04/30, her Hgb level dropped to 6.9, and she was given 2 units PRBCs. As mentioned above, she  was also given PRBCs postoperatively for an acute drop in Hgb.   6. Tobacco Use: Patient is a current smoker with an eight-pack-year history who is interested in quitting smoking. - Nicotine replacement  Discharge Vitals:   BP 138/75 (BP Location: Right Arm)   Pulse 80   Temp 98.1 F (36.7 C) (Oral)   Resp 16   SpO2 99%   Pertinent Labs, Studies, and Procedures:  CBC Latest Ref Rng & Units 09/26/2019 09/25/2019 09/24/2019  WBC 4.0 - 10.5 K/uL 5.5 4.6 6.2  Hemoglobin 12.0 - 15.0 g/dL 7.6(L) 7.7(L) 8.7(L)  Hematocrit 36.0 - 46.0 % 24.6(L) 24.4(L) 28.0(L)  Platelets 150 - 400 K/uL 256 262 252   BMP Latest Ref Rng & Units 09/26/2019 09/25/2019 09/24/2019  Glucose 70 - 99 mg/dL 94 154(H) 148(H)  BUN 6 - 20 mg/dL 66(H) 72(H) 72(H)  Creatinine 0.44 - 1.00 mg/dL 2.47(H) 2.59(H) 2.88(H)  BUN/Creat Ratio 9 - 23 - - -  Sodium 135 - 145 mmol/L 134(L) 134(L) 132(L)  Potassium 3.5 - 5.1 mmol/L 5.4(H) 5.7(H) 5.6(H)  Chloride 98 - 111 mmol/L 103 103 107  CO2 22 - 32 mmol/L 21(L) 19(L) 16(L)  Calcium 8.9 - 10.3 mg/dL 8.4(L) 8.6(L) 8.1(L)   Knee x-ray: EXAM: LEFT KNEE - 1-2 VIEW  COMPARISON:  None.  FINDINGS: Soft tissue overlying the distal aspect of the amputated tibia and fibula contains gas. No evidence for acute fracture or dislocation. Knee joint degenerative changes. Amputation of the distal aspect of the tibia and fibula.  IMPRESSION: Gas within the soft tissues overlying the distal aspect of the tibia and fibula most compatible with reported injury. No definite evidence for acute fracture.   Discharge Instructions: Discharge Instructions    Call MD for:  difficulty breathing, headache or visual disturbances   Complete by: As directed    Call MD for:  extreme fatigue   Complete by: As directed    Call MD for:  hives   Complete by: As directed    Call MD for:  persistant dizziness or light-headedness   Complete by: As directed    Call MD for:  persistant nausea and vomiting    Complete by: As directed    Call  MD for:  redness, tenderness, or signs of infection (pain, swelling, redness, odor or green/yellow discharge around incision site)   Complete by: As directed    Call MD for:  severe uncontrolled pain   Complete by: As directed    Call MD for:  temperature >100.4   Complete by: As directed    Diet - low sodium heart healthy   Complete by: As directed    Increase activity slowly   Complete by: As directed       Signed: Asencion Noble, MD 09/26/2019, 6:42 PM   Pager: [phone]

## 2019-09-25 NOTE — TOC Initial Note (Signed)
Transition of Care [facility]) - Initial/Assessment Note    Patient Details  Name: [name] [name] MRN: [id number] Date of Birth: 24-Apr-1976  Transition of [facility]) CM/SW Contact:    Carles Collet, RN Phone Number: 09/25/2019, 4:05 PM  Clinical Narrative:         Insurance denied for CIR. Spoke w patent at bedside and she interstedin SNF. She is agreeable to be faxed out to [facility] facilities, except [facility]. PASRR obtained (is entered as Leisure centre manager with one "n") FL2 done, faxed out to area SNFs.  Needs offers presented, choice made, and insurance authorization, will also need clarification on VAC type.  Per Ortho NP, Bevely Palmer Persons staff is to try to change her to [facility] prior to DC. I included wound VAC on FL2 in case [facility] does not work, facility would need notified of need for VAC.          Expected Discharge Plan: [place] Barriers to Discharge: Continued Medical Work up, Other (comment)   Patient Goals and CMS Choice        Expected Discharge Plan and Services Expected Discharge Plan: [place]                                              Prior Living Arrangements/Services                       Activities of Daily Living Home Assistive Devices/Equipment: Cane (specify quad or straight), Walker (specify type), Wheelchair ADL Screening (condition at time of admission) Patient's cognitive ability adequate to safely complete daily activities?: Yes Is the patient deaf or have difficulty hearing?: No Does the patient have difficulty seeing, even when wearing glasses/contacts?: No Does the patient have difficulty concentrating, remembering, or making decisions?: No Patient able to express need for assistance with ADLs?: Yes Does the patient have difficulty dressing or bathing?: No Independently performs ADLs?: Yes (appropriate for developmental age) Communication:  Independent Dressing (OT): Independent Is this a change from baseline?: Pre-admission baseline Grooming: Independent Is this a change from baseline?: Pre-admission baseline Feeding: Independent Bathing: Independent Is this a change from baseline?: Pre-admission baseline Toileting: Needs assistance Is this a change from baseline?: Pre-admission baseline In/Out Bed: Needs assistance Is this a change from baseline?: Pre-admission baseline Walks in Home: Needs assistance Is this a change from baseline?: Pre-admission baseline Does the patient have difficulty walking or climbing stairs?: Yes Weakness of Legs: Right(left bka) Weakness of Arms/Hands: None  Permission Sought/Granted                  Emotional Assessment              Admission diagnosis:  Wound dehiscence [T81.30XA] Fall, initial encounter [W19.XXXA] Patient Active Problem List   Diagnosis Date Noted  . Wound dehiscence 09/04/2019  . Right carpal tunnel syndrome 06/14/2019  . Left carpal tunnel syndrome 06/14/2019  . Tobacco use 03/01/2019  . Phantom limb pain ([place]) 11/08/2018  . Current moderate episode of major depressive disorder ([place])   . Anemia of chronic disease   . Severe protein-calorie malnutrition ([place])   . Dehiscence of amputation stump ([place])   . Fall   . Status post below knee amputation, left ([place]) 04/11/2017  . Bipolar affective disorder ([place])   . Adjustment disorder with depressed mood  03/26/2017  . Heart failure with preserved ejection fraction ([place]) 02/09/2017  . Routine adult health maintenance 12/08/2016  . Chronic pain of right knee 09/08/2016  . Chronic kidney disease (CKD), stage III (moderate) 07/15/2016  . OSA (obstructive sleep apnea) 05/13/2016  . Morbid obesity due to excess calories ([place]) 11/27/2015  . Hypothyroidism 04/25/2013  . Controlled type 2 diabetes mellitus ([place]) 07/12/2012  . Carpal tunnel syndrome, bilateral 01/25/2012  . Diabetic polyneuropathy associated with  type 2 diabetes mellitus ([place]) 07/04/2011  . Anxiety and depression 06/02/2011  . GERD 12/06/2007  . Hyperlipidemia 08/29/2007  . Hypertension associated with diabetes ([place]) 08/29/2007   PCP:  Ina Homes, MD Pharmacy:   [facility] [place], [place] - [address] [place] [address] Phone: [phone] Fax: [phone]     Social Determinants of Health (SDOH) Interventions    Readmission Risk Interventions No flowsheet data found.

## 2019-09-25 NOTE — Progress Notes (Signed)
Inpatient Rehab Admissions Coordinator:   Met with pt at bedside to update on insurance pending peer to peer status.  She is agreeable to CIR or SNF.  Will continue to follow.    Shann Medal, PT, DPT Admissions Coordinator [phone] 09/25/19  11:13 AM

## 2019-09-25 NOTE — Plan of Care (Signed)
  Problem: Health Behavior/Discharge Planning: Goal: Ability to manage health-related needs will improve Outcome: Progressing   Problem: Clinical Measurements: Goal: Ability to maintain clinical measurements within normal limits will improve Outcome: Progressing   Problem: Activity: Goal: Risk for activity intolerance will decrease Outcome: Progressing   Problem: Nutrition: Goal: Adequate nutrition will be maintained Outcome: Progressing   Problem: Coping: Goal: Level of anxiety will decrease Outcome: Progressing   Problem: Elimination: Goal: Will not experience complications related to bowel motility Outcome: Progressing   Problem: Pain Managment: Goal: General experience of comfort will improve Outcome: Progressing   Problem: Safety: Goal: Ability to remain free from injury will improve Outcome: Progressing   

## 2019-09-25 NOTE — Progress Notes (Signed)
Inpatient Rehab Admissions Coordinator:   Notified by [employer] company that request for CIR has been denied following peer to peer.  Will let pt and CM know and sign off for CIR at this time.   Shann Medal, PT, DPT Admissions Coordinator [phone] 09/25/19  2:20 PM

## 2019-09-25 NOTE — NC FL2 (Signed)
[address] MEDICAID FL2 LEVEL OF CARE SCREENING TOOL     IDENTIFICATION  Patient Name: [name] [name] Birthdate: 08/10/1975 Sex: female Admission Date (Current Location): 09/18/2019  [facility] and [place] Number:  Herbalist and Address:  [facility], [place] [address], [place], [place] [address]      Provider Number: [id number]  Attending Physician Name and Address:  Sid Falcon, MD  Relative Name and Phone Number:       Current Level of Care: Hospital Recommended Level of Care: [place] Prior Approval Number:    Date Approved/Denied:   PASRR Number: [id number]  Discharge Plan: SNF    Current Diagnoses: Patient Active Problem List   Diagnosis Date Noted  . Wound dehiscence 09/04/2019  . Right carpal tunnel syndrome 06/14/2019  . Left carpal tunnel syndrome 06/14/2019  . Tobacco use 03/01/2019  . Phantom limb pain ([place]) 11/08/2018  . Current moderate episode of major depressive disorder ([place])   . Anemia of chronic disease   . Severe protein-calorie malnutrition ([place])   . Dehiscence of amputation stump ([place])   . Fall   . Status post below knee amputation, left ([place]) 04/11/2017  . Bipolar affective disorder ([place])   . Adjustment disorder with depressed mood 03/26/2017  . Heart failure with preserved ejection fraction ([place]) 02/09/2017  . Routine adult health maintenance 12/08/2016  . Chronic pain of right knee 09/08/2016  . Chronic kidney disease (CKD), stage III (moderate) 07/15/2016  . OSA (obstructive sleep apnea) 05/13/2016  . Morbid obesity due to excess calories ([place]) 11/27/2015  . Hypothyroidism 04/25/2013  . Controlled type 2 diabetes mellitus ([place]) 07/12/2012  . Carpal tunnel syndrome, bilateral 01/25/2012  . Diabetic polyneuropathy associated with type 2 diabetes mellitus ([place]) 07/04/2011  . Anxiety and depression 06/02/2011  . GERD 12/06/2007  . Hyperlipidemia 08/29/2007  . Hypertension associated  with diabetes ([place]) 08/29/2007    Orientation RESPIRATION BLADDER Height & Weight     Self, Time, Situation, Place  Normal Continent Weight:   Height:     BEHAVIORAL SYMPTOMS/MOOD NEUROLOGICAL BOWEL NUTRITION STATUS      Continent Diet(se DC note for diet recommendations)  AMBULATORY STATUS COMMUNICATION OF NEEDS Skin   Extensive Assist Verbally Surgical wounds, Wound Vac                       Personal Care Assistance Level of Assistance  Bathing, Feeding, Dressing Bathing Assistance: Maximum assistance Feeding assistance: Limited assistance Dressing Assistance: Maximum assistance     Functional Limitations Info  Sight Sight Info: Impaired        SPECIAL CARE FACTORS FREQUENCY  PT (By licensed PT), OT (By licensed OT)     PT Frequency: 5X week OT Frequency: 5X week            Contractures Contractures Info: Not present    Additional Factors Info  Code Status, Allergies, Insulin Sliding Scale Code Status Info: Full Allergies Info: Vancomycin   Insulin Sliding Scale Info: See DC note for final insulin sliding scale orders       Current Medications (09/25/2019):  This is the current hospital active medication list Current Facility-Administered Medications  Medication Dose Route Frequency Provider Last Rate Last Admin  . 0.9 %  sodium chloride infusion (Manually program via Guardrails IV Fluids)   Intravenous Once Persons, Bevely Palmer, PA      . 0.9 %  sodium chloride infusion (Manually program via Guardrails IV  Fluids)   Intravenous Once Persons, Bevely Palmer, [place]      . acetaminophen (TYLENOL) tablet 650 mg  650 mg Oral Q6H Asencion Noble, MD   650 mg at 09/25/19 1127   Or  . acetaminophen (TYLENOL) suppository 650 mg  650 mg Rectal Q6H Asencion Noble, MD      . amitriptyline (ELAVIL) tablet 50 mg  50 mg Oral QHS Persons, Bevely Palmer, [place]   50 mg at 09/24/19 2143  . atorvastatin (LIPITOR) tablet 20 mg  20 mg Oral Daily Persons, Bevely Palmer, [place]   20 mg at  09/25/19 0843  . bumetanide (BUMEX) tablet 1 mg  1 mg Oral q1800 Persons, Bevely Palmer, PA   1 mg at 09/24/19 1735  . bumetanide (BUMEX) tablet 2 mg  2 mg Oral Q24H Persons, Bevely Palmer, PA   2 mg at 09/25/19 0843  . Chlorhexidine Gluconate Cloth 2 % PADS 6 each  6 each Topical Daily Sid Falcon, MD   6 each at 09/25/19 [phone]  . cyclobenzaprine (FLEXERIL) tablet 5 mg  5 mg Oral Daily PRN Persons, Bevely Palmer, PA   5 mg at 09/24/19 0141  . docusate sodium (COLACE) capsule 100 mg  100 mg Oral BID Persons, Bevely Palmer, PA   100 mg at 09/25/19 0844  . FLUoxetine (PROZAC) capsule 60 mg  60 mg Oral Daily Persons, Bevely Palmer, [place]   60 mg at 09/25/19 7322  . hydrOXYzine (ATARAX/VISTARIL) tablet 25 mg  25 mg Oral Q6H PRN Asencion Noble, MD      . insulin aspart (novoLOG) injection 0-15 Units  0-15 Units Subcutaneous TID WC Persons, Bevely Palmer, [place]   3 Units at 09/25/19 1324  . insulin aspart (novoLOG) injection 8 Units  8 Units Subcutaneous TID WC Asencion Noble, MD   8 Units at 09/25/19 1324  . insulin glargine (LANTUS) injection 30 Units  30 Units Subcutaneous QHS Asencion Noble, MD   30 Units at 09/24/19 2143  . mupirocin ointment (BACTROBAN) 2 % 1 application  1 application Nasal BID Sid Falcon, MD   1 application at 02/54/27 [phone]  . nicotine (NICODERM CQ - dosed in mg/24 hours) patch 14 mg  14 mg Transdermal Daily Persons, Bevely Palmer, PA   14 mg at 09/25/19 0846  . ondansetron (ZOFRAN) tablet 4 mg  4 mg Oral Q6H PRN Persons, Bevely Palmer, PA       Or  . ondansetron [facility]) injection 4 mg  4 mg Intravenous Q6H PRN Persons, Bevely Palmer, [place]      . oxyCODONE (Oxy IR/ROXICODONE) immediate release tablet 5-10 mg  5-10 mg Oral Q4H PRN Persons, Bevely Palmer, PA   10 mg at 09/25/19 1323  . pregabalin (LYRICA) capsule 50 mg  50 mg Oral TID Persons, Bevely Palmer, PA   50 mg at 09/25/19 1530  . senna-docusate (Senokot-S) tablet 1 tablet  1 tablet Oral BID Asencion Noble, MD   1 tablet at 09/25/19 [phone]  . sodium  bicarbonate tablet 650 mg  650 mg Oral BID Sid Falcon, MD   650 mg at 09/25/19 0843  . sodium zirconium cyclosilicate (LOKELMA) packet 10 g  10 g Oral BID Sid Falcon, MD   10 g at 09/25/19 1128     Discharge Medications: Please see discharge summary for a list of discharge medications.  Relevant Imaging Results:  Relevant Lab Results:   Additional Information SSN: [id number]  Carles Collet, RN

## 2019-09-26 ENCOUNTER — Other Ambulatory Visit: Payer: Self-pay | Admitting: Physician Assistant

## 2019-09-26 ENCOUNTER — Encounter: Payer: Self-pay | Admitting: *Deleted

## 2019-09-26 LAB — BASIC METABOLIC PANEL
Anion gap: 10 (ref 5–15)
BUN: 66 mg/dL — ABNORMAL HIGH (ref 6–20)
CO2: 21 mmol/L — ABNORMAL LOW (ref 22–32)
Calcium: 8.4 mg/dL — ABNORMAL LOW (ref 8.9–10.3)
Chloride: 103 mmol/L (ref 98–111)
Creatinine, Ser: 2.47 mg/dL — ABNORMAL HIGH (ref 0.44–1.00)
GFR calc Af Amer: 27 mL/min — ABNORMAL LOW (ref >60)
GFR calc non Af Amer: 23 mL/min — ABNORMAL LOW (ref >60)
Glucose, Bld: 94 mg/dL (ref 70–99)
Potassium: 5.4 mmol/L — ABNORMAL HIGH (ref 3.5–5.1)
Sodium: 134 mmol/L — ABNORMAL LOW (ref 135–145)

## 2019-09-26 LAB — CBC
HCT: 24.6 % — ABNORMAL LOW (ref 36.0–46.0)
Hemoglobin: 7.6 g/dL — ABNORMAL LOW (ref 12.0–15.0)
MCH: 26.4 pg (ref 26.0–34.0)
MCHC: 30.9 g/dL (ref 30.0–36.0)
MCV: 85.4 fL (ref 80.0–100.0)
Platelets: 256 10*3/uL (ref 150–400)
RBC: 2.88 MIL/uL — ABNORMAL LOW (ref 3.87–5.11)
RDW: 14.1 % (ref 11.5–15.5)
WBC: 5.5 10*3/uL (ref 4.0–10.5)
nRBC: 0 % (ref 0.0–0.2)

## 2019-09-26 LAB — GLUCOSE, CAPILLARY
Glucose-Capillary: 78 mg/dL (ref 70–99)
Glucose-Capillary: 62 mg/dL — ABNORMAL LOW (ref 70–99)
Glucose-Capillary: 87 mg/dL (ref 70–99)

## 2019-09-26 LAB — CK: Total CK: 55 U/L (ref 38–234)

## 2019-09-26 MED ORDER — OXYCODONE-ACETAMINOPHEN 5-325 MG PO TABS: 1.0000 | ORAL_TABLET | ORAL | 0 refills | Status: DC | PRN

## 2019-09-26 MED ORDER — SODIUM BICARBONATE 650 MG PO TABS: 650.0000 mg | ORAL_TABLET | Freq: Two times a day (BID) | ORAL | 0 refills | Status: DC

## 2019-09-26 MED ORDER — SODIUM ZIRCONIUM CYCLOSILICATE 10 G PO PACK: 10.0000 g | PACK | Freq: Two times a day (BID) | ORAL | 0 refills | Status: DC

## 2019-09-26 NOTE — Progress Notes (Signed)
Wound vac d/c and dry dressing placed.  Patient tolerated procedure well.  Assisted by Manuela Schwartz, RN.

## 2019-09-26 NOTE — Progress Notes (Signed)
Physical Therapy Treatment Patient Details Name: [name] [name] MRN: [id number] DOB: 1975-12-24 Today's Date: 09/26/2019    History of Present Illness Patient is a 44 year old woman who is s/p left transtibial amputation revision due to fall on residual limb with traumatic wound dehiscence. PMH significant of L BKA 2018, L BKA revision 08/2019 (approx 2 weeks ago), OSA on CPAP, DM 2, LE edema, cellulitis, acute low back pain, and fibromyalgia.    PT Comments    Pt seated in recliner OOB on arrival.  Pt very frustrated at her situation.  She doesn't understand why she cannot do rehab here in the hospital.  Based on her presentation she continues to benefit from aggressive rehab at [facility] to maximize functional gains before returning home.  Pt has been denies CIR and CM currently working on getting patient placed at SNF.  If patient does not go SNF she will require a BSC at home.  It is not best for her to go home as she sleeps on a blow up mattress on the floor.      Follow Up Recommendations  CIR;Supervision for mobility/OOB     Equipment Recommendations  3in1 (PT)    Recommendations for Other Services       Precautions / Restrictions Precautions Precautions: Fall;Other (comment) Precaution Comments: LLE wound vac Restrictions Weight Bearing Restrictions: Yes LLE Weight Bearing: Non weight bearing Other Position/Activity Restrictions: BKA revision    Mobility  Bed Mobility Overal bed mobility: Modified Independent             General bed mobility comments: up in recliner  Transfers Overall transfer level: Needs assistance Equipment used: Rolling walker (2 wheeled) Transfers: Sit to/from Stand Sit to Stand: Min guard         General transfer comment: Cues for hand placement to and from seated surface.  Ambulation/Gait Ambulation/Gait assistance: Min guard Gait Distance (Feet): 12 Feet Assistive device: Rolling walker (2 wheeled) Gait Pattern/deviations:  Step-to pattern;Trunk flexed(hop to pattern.) Gait velocity: decreased   General Gait Details: Hop height remains low but able to progress distance.   Stairs             Wheelchair Mobility    Modified Rankin (Stroke Patients Only)       Balance Overall balance assessment: Needs assistance Sitting-balance support: No upper extremity supported;Feet supported Sitting balance-Leahy Scale: Good     Standing balance support: Bilateral upper extremity supported Standing balance-Leahy Scale: Poor Standing balance comment: reliant on UE support                            Cognition Arousal/Alertness: Awake/alert Behavior During Therapy: WFL for tasks assessed/performed Overall Cognitive Status: Within Functional Limits for tasks assessed                                 General Comments: pt motivated to keep UE strength, pt verbalized wanting to get out of bed more and verbalized benefits for OOB.  Pt is very frustrated at her situation and not being able to go to rehab in the hospital.      Exercises General Exercises - Lower Extremity Heel Raises: 10 reps;Standing;Right;AROM Amputee Exercises Hip Extension: AROM;Left;10 reps;Standing Hip ABduction/ADduction: AROM;Left;10 reps;Standing Hip Flexion/Marching: AROM;Left;10 reps;Standing    General Comments General comments (skin integrity, edema, etc.): Pt with significant coughing spell while OOB.       Pertinent  Vitals/Pain Pain Assessment: Faces Faces Pain Scale: Hurts even more Pain Location: LLE residual limb Pain Descriptors / Indicators: Grimacing;Discomfort;Sore Pain Intervention(s): Monitored during session;Repositioned    Home Living                      Prior Function            PT Goals (current goals can now be found in the care plan section) Acute Rehab PT Goals Patient Stated Goal: to maintain UE strength Potential to Achieve Goals: Good Progress towards PT  goals: Progressing toward goals    Frequency    Min 3X/week      PT Plan Current plan remains appropriate    Co-evaluation              AM-PAC PT "6 Clicks" Mobility   Outcome Measure  Help needed turning from your back to your side while in a flat bed without using bedrails?: None Help needed moving from lying on your back to sitting on the side of a flat bed without using bedrails?: None Help needed moving to and from a bed to a chair (including a wheelchair)?: A Little Help needed standing up from a chair using your arms (e.g., wheelchair or bedside chair)?: A Little Help needed to walk in hospital room?: A Little Help needed climbing 3-5 steps with a railing? : A Lot 6 Click Score: 19    End of Session Equipment Utilized During Treatment: Gait belt Activity Tolerance: Patient tolerated treatment well Patient left: in chair;with call bell/phone within reach;with chair alarm set Nurse Communication: Mobility status PT Visit Diagnosis: Other abnormalities of gait and mobility (R26.89);Difficulty in walking, not elsewhere classified (R26.2)     Time: 0258-5277 PT Time Calculation (min) (ACUTE ONLY): 13 min  Charges:  $Gait Training: 8-22 mins                     Erasmo Leventhal , PTA Acute Rehabilitation Services Pager [phone] Office [place] Holsonback 09/26/2019, 11:46 AM

## 2019-09-26 NOTE — Progress Notes (Signed)
Patient discharged home by private care.  Discharge instructions given to patient, patient denied further questions and acknowledged she would pick up her prescriptions at pharmacy and schedule follow up visits.

## 2019-09-26 NOTE — TOC Progression Note (Addendum)
Transition of Care [facility]) - Progression Note    Patient Details  Name: [name] [name] MRN: [id number] Date of Birth: Mar 14, 1976  Transition of Care [facility]) CM/SW Contact  Jacalyn Lefevre Edson Snowball, RN Phone Number: 09/26/2019, 11:01 AM  Clinical Narrative:     Discussed SNF bed offers with patient at bedside. Provided Medicare.gov website ratings. Patient has chosen [facility] , notified Freda Munro at [facility]. Will start insurance authorization.   Updated Freda Munro on wound care Remove wound VAC. Cleans with mild soap and water. Apply dry dressing/ Then dry dressing change daily  Explained to patient will  need new covid test . Patient voiced understanding  Expected Discharge Plan: [place] Barriers to Discharge: Continued Medical Work up, Other (comment)  Expected Discharge Plan and Services Expected Discharge Plan: [place]                                               Social Determinants of Health (SDOH) Interventions    Readmission Risk Interventions No flowsheet data found.

## 2019-09-26 NOTE — Plan of Care (Signed)

## 2019-09-26 NOTE — Progress Notes (Addendum)
Subjective:  The patient reports that she is doing fine. She reports that the pain in her stump is still present despite her current regimen for pain control. She is amenable with plan for SNF. There are no other complaints or concerns at this time.  Objective:  Vital signs in last 24 hours: Vitals:   09/25/19 1258 09/25/19 1931 09/26/19 0433 09/26/19 0740  BP: (!) 161/79 140/62 122/65 138/75  Pulse: 88 90 82 80  Resp: 15 16 16 16   Temp: 98.1 F (36.7 C) (!) 97.5 F (36.4 C) 98.2 F (36.8 C) 98.1 F (36.7 C)  TempSrc: Oral Oral Oral Oral  SpO2: 99% 100% 100% 99%   General: Middle aged female, NAD, laying in bed Cardiac: RRR, no m/r/g; no JVD Pulmonary: CTABL, no wheezing, rhonchi, rales Abdominal: Mildly distended; normoactive bowel sounds.  Extremities: Left BKA, wound vac in place with minimal drainage    Assessment/Plan:  [name] [name] is a 44 y.o. female with a history of left leg osteomyelitis status post BKA in 2018 with recent revisions and April 2021, hypertension, hyperlipidemia, fibromyalgia, diabetes, HFpEF, anemia of chronic disease, and hypothyroidism who presented after falling on her wound causing wound dehiscence. Patient is stable for discharge home today.  Active Problems:   Diabetic polyneuropathy associated with type 2 diabetes mellitus ([place])   Fall   Wound dehiscence  Left leg BKA wound dehiscence: Status post wound revision of BKA on 09/20/2019. Wound vac with minimal output. Patient with noticeable odor from wound vac, likely from moisture. Will remove vac, cleanse with mild soap and water, and apply daily dry dressing changes per Ortho. Patient is not wanting SNF at this time so will be discharged home as soon as today. -Orthopedics following, appreciate recommendations -Continue pain control with oxycodone and scheduled Tylenol -Continue PT/OT -Plan for discharge home, likely this afternoon.  Diabetes mellitus: -Glucose of 78 today, most  recently in 70s to 90s range in the past 24 hours on SSI, Lantus, and Novolog regimens. Continue current regimens at this time. -Continue SSI -Continue basal coverage with Lantus 30 units QHS -Continue Novolog, 8 units, TID for mealtime coverage -Monitor CBGs  Hyperkalemia:  CKD stage III: -K has been persistently elevated following her procedure on 04/30. Today, her potassium is 5.4, slightly improved since the day prior. Prior to her procedure it was 5.2. EKG showed no evidence of peaked T waves. Unclear etiology, but potential causes include underlying CKD, hyperglycemia, and RTA type 4. Bicarb 21 today from 24 yesterday. Will continue sodium bicarbonate therapy, 650 mg BID for a goal bicarb of 20-24. Will also continue to treat hyperkalemia with Lokelma. -Lokelma 2 packets -Sodium bicarbonate, 650 mg BID, goal bicarb of 20-24 -Renin/aldosterone, urine Cr and sodium pending -Follow-up with Nephrology following discharge.  Constipation -Senna-docusate, 2 packets  HTN: Patient is on home amlodipine, bumetanide, metoprolol and lisinopril. Most recent BP of 138/75, up from 34V-425Z systolic. We will continue to hold amlodipine and metoprolol in the setting of recent hypotension. -Holding home amlodipine and metoprolol   HLD: - Continue home lipitor   Fibromyalgia: - Continue home amitriptyline, oxycodone, flexeril and lyrica   HFpEF: - Continue home BP meds as above.  Tobacco Use: The patient is interested in obtaining resources for assistance in quitting smoking prior to discharge. -Continue nicotine replacement inpatient  Prior to Admission Living Arrangement: Home Anticipated Discharge Location: Home Barriers to Discharge: None Dispo: Anticipated discharge today  Orvis Brill, Medical Student 09/26/2019, 11:45 AM Pager:  [phone]  

## 2019-09-26 NOTE — Progress Notes (Signed)
Occupational Therapy Treatment Patient Details Name: [name] [name] MRN: [id number] DOB: [birth year] Today's Date: 09/26/2019    History of present illness Patient is a 44 year old woman who is s/p left transtibial amputation revision due to fall on residual limb with traumatic wound dehiscence. PMH significant of L BKA 2018, L BKA revision 08/2019 (approx 2 weeks ago), OSA on CPAP, DM 2, LE edema, cellulitis, acute low back pain, and fibromyalgia.   OT comments  Pt progressing towards acute OT goals. Focus of session was toilet transfer, pericare, and walking distance to access bathroom. Discussed bathroom setup at home, DME and strategies for toilet and shower transfers. Of note, pt with significant coughing spell during OOB activity, reports she needs to cough up some phlegm. D/c plan remains appropriate from OT standpoint though noted her insurance appeal for CIR was denied.    Follow Up Recommendations  Supervision/Assistance - 24 hour;CIR    Equipment Recommendations  Tub/shower bench;3 in 1 bedside commode    Recommendations for Other Services      Precautions / Restrictions Precautions Precautions: Fall;Other (comment) Precaution Comments: LLE wound vac Restrictions Weight Bearing Restrictions: Yes LLE Weight Bearing: Non weight bearing Other Position/Activity Restrictions: BKA revision       Mobility Bed Mobility Overal bed mobility: Modified Independent             General bed mobility comments: up in recliner  Transfers Overall transfer level: Needs assistance Equipment used: Rolling walker (2 wheeled) Transfers: Sit to/from Stand Sit to Stand: Min guard         General transfer comment: Cues for hand placement to and from seated surface.    Balance Overall balance assessment: Needs assistance Sitting-balance support: No upper extremity supported;Feet supported Sitting balance-Leahy Scale: Good     Standing balance support: Bilateral upper  extremity supported Standing balance-Leahy Scale: Poor Standing balance comment: reliant on UE support                           ADL either performed or assessed with clinical judgement   ADL Overall ADL's : Needs assistance/impaired                         Toilet Transfer: Minimal assistance;RW;Comfort height toilet;Grab bars Toilet Transfer Details (indicate cue type and reason): walked to bathroom to transfer to comfort height toilet. utilized grab bars. slow, guarded movements. min steadying assist         Functional mobility during ADLs: Minimal assistance;Rolling walker General ADL Comments: Pt completed mobity to/from bathroom, toiler transfer, and pericare. assist for steadying and management of wound vac line.     Vision       Perception     Praxis      Cognition Arousal/Alertness: Awake/alert Behavior During Therapy: WFL for tasks assessed/performed Overall Cognitive Status: Within Functional Limits for tasks assessed                                 General Comments: pt motivated to keep UE strength, pt verbalized wanting to get out of bed more and verbalized benefits for OOB.  Pt is very frustrated at her situation and not being able to go to rehab in the hospital.        Exercises General Exercises - Lower Extremity Heel Raises: 10 reps;Standing;Right;AROM Amputee Exercises Hip Extension: AROM;Left;10 reps;Standing Hip ABduction/ADduction: AROM;Left;10  reps;Standing Hip Flexion/Marching: AROM;Left;10 reps;Standing   Shoulder Instructions       General Comments Pt with significant coughing spell while OOB.     Pertinent Vitals/ Pain       Pain Assessment: Faces Faces Pain Scale: Hurts even more Pain Location: LLE residual limb Pain Descriptors / Indicators: Grimacing;Discomfort;Sore Pain Intervention(s): Monitored during session;Repositioned  Home Living                                           Prior Functioning/Environment              Frequency  Min 2X/week        Progress Toward Goals  OT Goals(current goals can now be found in the care plan section)  Progress towards OT goals: Progressing toward goals  Acute Rehab OT Goals Patient Stated Goal: to maintain UE strength OT Goal Formulation: With patient Time For Goal Achievement: 10/05/19 Potential to Achieve Goals: Fair ADL Goals Pt Will Transfer to Toilet: with modified independence;ambulating;bedside commode Pt Will Perform Tub/Shower Transfer: with modified independence;tub bench;rolling walker;Stand pivot transfer  Plan Discharge plan remains appropriate    Co-evaluation                 AM-PAC OT "6 Clicks" Daily Activity     Outcome Measure   Help from another person eating meals?: A Little Help from another person taking care of personal grooming?: A Little Help from another person toileting, which includes using toliet, bedpan, or urinal?: A Little Help from another person bathing (including washing, rinsing, drying)?: A Little Help from another person to put on and taking off regular upper body clothing?: A Little Help from another person to put on and taking off regular lower body clothing?: A Little 6 Click Score: 18    End of Session    OT Visit Diagnosis: Unsteadiness on feet (R26.81);Pain;History of falling (Z91.81) Pain - Right/Left: Left Pain - part of body: Leg   Activity Tolerance     Patient Left     Nurse Communication          Time: 6269-4854 OT Time Calculation (min): 43 min  Charges: OT General Charges $OT Visit: 1 Visit OT Treatments $Self Care/Home Management : 38-52 mins  Tyrone Schimke, OT Acute Rehabilitation Services Pager: [phone] Office: [phone]    Hortencia Pilar 09/26/2019, 11:42 AM

## 2019-09-26 NOTE — TOC Initial Note (Addendum)
Transition of Care [facility]) - Initial/Assessment Note    Patient Details  Name: [name] [name] MRN: [id number] Date of Birth: 06/06/1975  [facility] CM/SW Contact:    Marilu Favre, RN Phone Number: 09/26/2019, 1:28 PM  Clinical Narrative:                 Received message from MD that patient has changed her mind and wants to go home at discharge. Spoke to patient on phone , patient confirmed. Patient states she has 24/7 assistance at home. She already has a wheel chair and walker and cane.   PT/OT rec tub bench and 3 in1 . Patient in agreement. Same ordered from [place] with [facility]. Zac returned call patient received a 3 in1 and tub bench both in 2018 and Insurance will not cover again until 2023. Patient aware.  Patient has no preference in home health agency.  Asked MD for orders and face to face   Tommi Rumps with [facility] has accepted for home health PT and OT   Patient states she has transportation home today .  Patient states someone at DR Duda's office recommended a sliding board. I have messaged Persons PA to see if it is OK to order.Sliding board ordered with [facility]  Expected Discharge Plan: [place] Barriers to Discharge: Continued Medical Work up, Other (comment)   Patient Goals and CMS Choice Patient states their goals for this hospitalization and ongoing recovery are:: to go home CMS Medicare.gov Compare Post Acute Care list provided to:: Patient Choice offered to / list presented to : Patient  Expected Discharge Plan and Services Expected Discharge Plan: [place]   Discharge Planning Services: CM Consult Post Acute Care Choice: Home Health, Durable Medical Equipment Living arrangements for the past 2 months: Single Family Home                 DME Arranged: 3-N-1, Tub bench DME Agency: [facility]       HH Arranged: PT, OT          Prior Living Arrangements/Services Living arrangements for the  past 2 months: Single Family Home Lives with:: Other (Comment)(god mother , god daughter , and uncle) Patient language and need for interpreter reviewed:: Yes        Need for Family Participation in Patient Care: Yes (Comment) Care giver support system in place?: Yes (comment) Current home services: DME Criminal Activity/Legal Involvement Pertinent to Current Situation/Hospitalization: No - Comment as needed  Activities of Daily Living Home Assistive Devices/Equipment: Cane (specify quad or straight), Walker (specify type), Wheelchair ADL Screening (condition at time of admission) Patient's cognitive ability adequate to safely complete daily activities?: Yes Is the patient deaf or have difficulty hearing?: No Does the patient have difficulty seeing, even when wearing glasses/contacts?: No Does the patient have difficulty concentrating, remembering, or making decisions?: No Patient able to express need for assistance with ADLs?: Yes Does the patient have difficulty dressing or bathing?: No Independently performs ADLs?: Yes (appropriate for developmental age) Communication: Independent Dressing (OT): Independent Is this a change from baseline?: Pre-admission baseline Grooming: Independent Is this a change from baseline?: Pre-admission baseline Feeding: Independent Bathing: Independent Is this a change from baseline?: Pre-admission baseline Toileting: Needs assistance Is this a change from baseline?: Pre-admission baseline In/Out Bed: Needs assistance Is this a change from baseline?: Pre-admission baseline Walks in Home: Needs assistance Is this a change from baseline?: Pre-admission baseline Does the patient have  difficulty walking or climbing stairs?: Yes Weakness of Legs: Right(left bka) Weakness of Arms/Hands: None  Permission Sought/Granted   Permission granted to share information with : No              Emotional Assessment Appearance:: Appears stated  age Attitude/Demeanor/Rapport: Engaged Affect (typically observed): Accepting Orientation: : Oriented to Situation, Oriented to  Time, Oriented to Place, Oriented to Self Alcohol / Substance Use: Not Applicable    Admission diagnosis:  Wound dehiscence [T81.30XA] Fall, initial encounter [W19.XXXA] Patient Active Problem List   Diagnosis Date Noted  . Wound dehiscence 09/04/2019  . Right carpal tunnel syndrome 06/14/2019  . Left carpal tunnel syndrome 06/14/2019  . Tobacco use 03/01/2019  . Phantom limb pain ([place]) 11/08/2018  . Current moderate episode of major depressive disorder ([place])   . Anemia of chronic disease   . Severe protein-calorie malnutrition ([place])   . Dehiscence of amputation stump ([place])   . Fall   . Status post below knee amputation, left ([place]) 04/11/2017  . Bipolar affective disorder ([place])   . Adjustment disorder with depressed mood 03/26/2017  . Heart failure with preserved ejection fraction ([place]) 02/09/2017  . Routine adult health maintenance 12/08/2016  . Chronic pain of right knee 09/08/2016  . Chronic kidney disease (CKD), stage III (moderate) 07/15/2016  . OSA (obstructive sleep apnea) 05/13/2016  . Morbid obesity due to excess calories ([place]) 11/27/2015  . Hypothyroidism 04/25/2013  . Controlled type 2 diabetes mellitus ([place]) 07/12/2012  . Carpal tunnel syndrome, bilateral 01/25/2012  . Diabetic polyneuropathy associated with type 2 diabetes mellitus ([place]) 07/04/2011  . Anxiety and depression 06/02/2011  . GERD 12/06/2007  . Hyperlipidemia 08/29/2007  . Hypertension associated with diabetes ([place]) 08/29/2007   PCP:  Ina Homes, MD Pharmacy:   [facility] [place], [place] - [address] [place] [address] Phone: [phone] Fax: [phone]     Social Determinants of Health (SDOH) Interventions    Readmission Risk Interventions No flowsheet data found.

## 2019-09-26 NOTE — Discharge Instructions (Signed)
Stephannie C Locken,   It has been a pleasure working with you and we are glad you're feeling better. You were hospitalized for your left lower extremity wound. You underwent revision of this wound while you were here. Please continue taking your prescribed pain medications, follow up with your primary care provider to discuss your pain management.   Your blood pressure was low while you were here. Please hold your amlodipine, lisinopril and metoprolol for now. They will check your blood pressure at your follow up and see if you need to restart any medications.   Your potassium was high today, this may be due to your worsening kidney function. Please start taking the bicarbonate and lokelma. Please follow up with nephrology.    Follow up with your primary care provider in 1-2 weeks  If your symptoms worsen or you develop new symptoms, please seek medical help whether it is your primary care provider or emergency department.  If you have any questions about this hospitalization please call [phone].

## 2019-09-26 NOTE — Progress Notes (Signed)
Patient id POD 7 s/p Amputation Revision. Noticeable Odor from Leg. Ohterwise patient is comfortable no complaints  Plan : Spoke with Dr. Sharol Given. Odor most likely from moisture under vac. Will have vac removed, cleanse with mild soap and water apply dry dressing with daily dressing changes/ Will need 1 week follow up in office

## 2019-09-29 LAB — ALDOSTERONE + RENIN ACTIVITY W/ RATIO
ALDO / PRA Ratio: UNDETERMINED
Aldosterone: 10.6 ng/dL (ref 0.0–30.0)

## 2019-09-30 ENCOUNTER — Encounter: Payer: Self-pay | Admitting: Orthopedic Surgery

## 2019-09-30 ENCOUNTER — Other Ambulatory Visit: Payer: Self-pay

## 2019-09-30 ENCOUNTER — Ambulatory Visit (INDEPENDENT_AMBULATORY_CARE_PROVIDER_SITE_OTHER): Payer: Medicare HMO | Admitting: Physician Assistant

## 2019-09-30 VITALS — Ht 69.0 in | Wt 289.0 lb

## 2019-09-30 DIAGNOSIS — T8781 Dehiscence of amputation stump: Secondary | ICD-10-CM

## 2019-09-30 MED ORDER — DOXYCYCLINE HYCLATE 100 MG PO TABS: 100.0000 mg | ORAL_TABLET | Freq: Two times a day (BID) | ORAL | 0 refills | Status: DC

## 2019-09-30 NOTE — Progress Notes (Signed)
Office Visit Note   Patient: [name] [name]           Date of Birth: Oct 12, 1975           MRN: [id number] Visit Date: 09/30/2019              Requested by: Ina Homes, MD [address] [place],  [place] [address] PCP: Ina Homes, MD  Chief Complaint  Patient presents with  . Left Leg - Routine Post Op    09/20/19 left BKA revision      HPI: This is a 44 year old woman who is approximately 1 week status post left below-knee amputation revision.  She walked in today complaining that she had an area of opening on her wound.  This occurred yesterday.  She denies any falls.  She is also concerned that she has significant swelling in her right lower extremity.  She has had wraps before and they seem to help her.  Assessment & Plan: Visit Diagnoses: No diagnosis found.  Plan: We will do a Profore wrap on the right leg.  With regards to the left daily dressing changes with Iodosorb in the area of the dehiscence and oral antibiotics.  She will follow-up in 1 week.  Sooner if she has any significant changes  Follow-Up Instructions: No follow-ups on file.   Ortho Exam  Patient is alert, oriented, no adenopathy, well-dressed, normal affect, normal respiratory effort. Right lower extremity moderate plus soft tissue swelling.  No open ulcerations.  No cellulitis.  Left below-knee amputee stump revision.  Moderate amount of soft tissue swelling she does have a focal area of about 2 x 2 cm that probes about a centimeter deep of wound dehiscence.  There is no foul odor or surrounding cellulitis.  Imaging: No results found. No images are attached to the encounter.  Labs: Lab Results  Component Value Date   HGBA1C 8.5 (H) 09/04/2019   HGBA1C 7.0 (A) 05/30/2019   HGBA1C 6.6 (A) 02/28/2019   ESRSEDRATE 130 (H) 03/24/2017   ESRSEDRATE 53 (H) 09/14/2015   ESRSEDRATE 132 (H) 07/06/2012   CRP 13.6 (H) 02/14/2019   CRP 38.6 (H) 04/03/2018   CRP 5.4 (H) 09/14/2015   LABURIC 8.3 (H)  04/15/2019   REPTSTATUS 09/09/2019 FINAL 09/04/2019   GRAMSTAIN  09/04/2019    FEW WBC PRESENT, PREDOMINANTLY PMN MODERATE GRAM POSITIVE COCCI    CULT  09/04/2019    ABUNDANT STREPTOCOCCUS GROUP G Beta hemolytic streptococci are predictably susceptible to penicillin and other beta lactams. Susceptibility testing not routinely performed. FEW STAPHYLOCOCCUS AUREUS NO ANAEROBES ISOLATED Performed at [facility], [place] [address], [place], [place] [address]    LABORGA STAPHYLOCOCCUS AUREUS 09/04/2019     Lab Results  Component Value Date   ALBUMIN 2.3 (L) 09/18/2019   ALBUMIN 1.8 (L) 09/01/2019   ALBUMIN 2.1 (L) 02/12/2019   LABURIC 8.3 (H) 04/15/2019    Lab Results  Component Value Date   MG 2.3 04/07/2018   MG 2.1 04/05/2018   MG 2.4 03/25/2017   No results found for: VD25OH  No results found for: PREALBUMIN CBC EXTENDED Latest Ref Rng & Units 09/26/2019 09/25/2019 09/24/2019  WBC 4.0 - 10.5 K/uL 5.5 4.6 6.2  RBC 3.87 - 5.11 MIL/uL 2.88(L) 2.89(L) 3.22(L)  HGB 12.0 - 15.0 g/dL 7.6(L) 7.7(L) 8.7(L)  HCT 36.0 - 46.0 % 24.6(L) 24.4(L) 28.0(L)  PLT 150 - 400 K/uL 256 262 252  NEUTROABS 1.7 - 7.7 K/uL - - -  LYMPHSABS  0.7 - 4.0 K/uL - - -     Body mass index is 42.68 kg/m.  Orders:  No orders of the defined types were placed in this encounter.  Meds ordered this encounter  Medications  . doxycycline (VIBRA-TABS) 100 MG tablet    Sig: Take 1 tablet (100 mg total) by mouth 2 (two) times daily.    Dispense:  60 tablet    Refill:  0     Procedures: No procedures performed  Clinical Data: No additional findings.  ROS:  All other systems negative, except as noted in the HPI. Review of Systems  Objective: Vital Signs: Ht 5\' 9"  (1.753 m)   Wt 289 lb (131.1 kg)   BMI 42.68 kg/m   Specialty Comments:  No specialty comments available.  PMFS History: Patient Active Problem List   Diagnosis Date Noted  . Wound dehiscence 09/04/2019  . Right carpal tunnel  syndrome 06/14/2019  . Left carpal tunnel syndrome 06/14/2019  . Tobacco use 03/01/2019  . Phantom limb pain ([place]) 11/08/2018  . Current moderate episode of major depressive disorder ([place])   . Anemia of chronic disease   . Severe protein-calorie malnutrition ([place])   . Dehiscence of amputation stump ([place])   . Fall   . Status post below knee amputation, left ([place]) 04/11/2017  . Bipolar affective disorder ([place])   . Adjustment disorder with depressed mood 03/26/2017  . Heart failure with preserved ejection fraction ([place]) 02/09/2017  . Routine adult health maintenance 12/08/2016  . Chronic pain of right knee 09/08/2016  . Chronic kidney disease (CKD), stage III (moderate) 07/15/2016  . OSA (obstructive sleep apnea) 05/13/2016  . Morbid obesity due to excess calories ([place]) 11/27/2015  . Hypothyroidism 04/25/2013  . Controlled type 2 diabetes mellitus ([place]) 07/12/2012  . Carpal tunnel syndrome, bilateral 01/25/2012  . Diabetic polyneuropathy associated with type 2 diabetes mellitus ([place]) 07/04/2011  . Anxiety and depression 06/02/2011  . GERD 12/06/2007  . Hyperlipidemia 08/29/2007  . Hypertension associated with diabetes ([place]) 08/29/2007   Past Medical History:  Diagnosis Date  . Abdominal muscle pain 09/08/2016  . Abnormal Pap smear of cervix 2009  . Abscess    history of multiple abscesses  . Acute bilateral low back pain 02/13/2017  . Acute blood loss anemia   . Acute renal failure ([place]) 07/12/2012  . AKI (acute kidney injury) ([place])   . Anemia of chronic disease 2002  . Anxiety    Panic attacks  . Bilateral lower extremity edema 05/13/2016  . Bipolar disorder ([place])   . Cellulitis 05/21/2014   right eye  . Chronic bronchitis ([place])    "get it q yr" (05/13/2013)  . Chronic pain   . Depression   . Edema of lower extremity   . Endocarditis 2002   subacute bacterial endocarditis.   . Family history of anesthesia complication    "my mom has a hard time coming out from under"    . Fibromyalgia   . GERD (gastroesophageal reflux disease)    occ  . Heart murmur   . Herpes simplex type 1 infection 01/16/2018  . History of blood transfusion    "just low blood count" (05/13/2013)  . Hyperlipidemia   . Hypertension   . Hypothyroidism   . Hypothyroidism, adult 03/21/2014  . Leukocytosis   . Necrosis ([place])    and ulceration  . Necrotizing fasciitis s/p OR debridements 07/06/2012  . Obesity   . OSA on CPAP    does not wear all  the time  . Peripheral neuropathy   . Type II diabetes mellitus (HCC)    Type  II    Family History  Problem Relation Age of Onset  . Heart failure Mother   . Diabetes Mother   . Kidney disease Mother   . Kidney disease Father   . Diabetes Father   . Diabetes Paternal Grandmother   . Heart failure Paternal Grandmother   . Other Other     Past Surgical History:  Procedure Laterality Date  . ABDOMINAL AORTOGRAM W/LOWER EXTREMITY Left 03/19/2019   Procedure: ABDOMINAL AORTOGRAM W/LOWER EXTREMITY;  Surgeon: Serafina Mitchell, MD;  Location: [facility];  Service: Cardiovascular;  Laterality: Left;  . AMPUTATION Left 04/07/2017   Procedure: LEFT BELOW KNEE AMPUTATION;  Surgeon: Newt Minion, MD;  Location: [facility];  Service: Orthopedics;  Laterality: Left;  . EYE SURGERY     lazer  . INCISION AND DRAINAGE ABSCESS     multiple I&Ds  . INCISION AND DRAINAGE ABSCESS Left 07/09/2012   Procedure: DRESSING CHANGE, THIGH WOUND;  Surgeon: Harl Bowie, MD;  Location: [facility];  Service: General;  Laterality: Left;  . INCISION AND DRAINAGE OF WOUND Left 07/07/2012   Procedure: IRRIGATION AND DEBRIDEMENT WOUND;  Surgeon: Harl Bowie, MD;  Location: [facility];  Service: General;  Laterality: Left;  . INCISION AND DRAINAGE PERIRECTAL ABSCESS Left 07/14/2012   Procedure: DEBRIDEMENT OF SKIN & SOFT TISSUE; DRESSING CHANGE UNDER ANESTHESIA;  Surgeon: Gayland Curry, MD,FACS;  Location: [facility];  Service: General;  Laterality: Left;  . INCISION  AND DRAINAGE PERIRECTAL ABSCESS Left 07/16/2012   Procedure: I&D Left Thigh;  Surgeon: Gwenyth Ober, MD;  Location: [facility];  Service: General;  Laterality: Left;  . INCISION AND DRAINAGE PERIRECTAL ABSCESS N/A 01/05/2015   Procedure: IRRIGATION AND DEBRIDEMENT PERIRECTAL ABSCESS;  Surgeon: Donnie Mesa, MD;  Location: [facility];  Service: General;  Laterality: N/A;  . IRRIGATION AND DEBRIDEMENT ABSCESS Left 07/06/2012   Procedure: IRRIGATION AND DEBRIDEMENT ABSCESS BUTTOCKS AND THIGH;  Surgeon: Shann Medal, MD;  Location: [facility];  Service: General;  Laterality: Left;  . IRRIGATION AND DEBRIDEMENT ABSCESS Left 08/10/2012   Procedure: IRRIGATION AND DEBRIDEMENT ABSCESS;  Surgeon: Madilyn Hook, DO;  Location: [facility];  Service: General;  Laterality: Left;  . STUMP REVISION Left 06/09/2017   Procedure: REVISION LEFT BELOW KNEE AMPUTATION;  Surgeon: Newt Minion, MD;  Location: [facility];  Service: Orthopedics;  Laterality: Left;  . STUMP REVISION Left 09/04/2019   Procedure: LEFT BELOW KNEE AMPUTATION REVISION;  Surgeon: Newt Minion, MD;  Location: [facility];  Service: Orthopedics;  Laterality: Left;  . STUMP REVISION Left 09/20/2019   Procedure: LEFT BELOW KNEE AMPUTATION REVISION;  Surgeon: Newt Minion, MD;  Location: [facility];  Service: Orthopedics;  Laterality: Left;  . TONSILLECTOMY  1994   Social History   Occupational History  . Occupation: disability  Tobacco Use  . Smoking status: Current Every Day Smoker    Packs/day: 0.50    Years: 16.00    Pack years: 8.00    Types: Cigarettes  . Smokeless tobacco: Never Used  . Tobacco comment: 1 PPD  Substance and Sexual Activity  . Alcohol use: Yes    Alcohol/week: 0.0 standard drinks    Comment: Socially-" monthly  maybe"  . Drug use: No  . Sexual activity: Yes

## 2019-10-01 ENCOUNTER — Telehealth: Payer: Medicare HMO | Admitting: *Deleted

## 2019-10-01 ENCOUNTER — Ambulatory Visit: Payer: Self-pay | Admitting: *Deleted

## 2019-10-01 DIAGNOSIS — I130 Hypertensive heart and chronic kidney disease with heart failure and stage 1 through stage 4 chronic kidney disease, or unspecified chronic kidney disease: Secondary | ICD-10-CM | POA: Diagnosis not present

## 2019-10-01 DIAGNOSIS — E1159 Type 2 diabetes mellitus with other circulatory complications: Secondary | ICD-10-CM

## 2019-10-01 DIAGNOSIS — E1142 Type 2 diabetes mellitus with diabetic polyneuropathy: Secondary | ICD-10-CM | POA: Diagnosis not present

## 2019-10-01 DIAGNOSIS — Z89512 Acquired absence of left leg below knee: Secondary | ICD-10-CM

## 2019-10-01 DIAGNOSIS — N183 Chronic kidney disease, stage 3 unspecified: Secondary | ICD-10-CM | POA: Diagnosis not present

## 2019-10-01 DIAGNOSIS — I10 Essential (primary) hypertension: Secondary | ICD-10-CM

## 2019-10-01 DIAGNOSIS — D631 Anemia in chronic kidney disease: Secondary | ICD-10-CM | POA: Diagnosis not present

## 2019-10-01 DIAGNOSIS — M797 Fibromyalgia: Secondary | ICD-10-CM | POA: Diagnosis not present

## 2019-10-01 DIAGNOSIS — E1122 Type 2 diabetes mellitus with diabetic chronic kidney disease: Secondary | ICD-10-CM | POA: Diagnosis not present

## 2019-10-01 DIAGNOSIS — I152 Hypertension secondary to endocrine disorders: Secondary | ICD-10-CM

## 2019-10-01 DIAGNOSIS — I5030 Unspecified diastolic (congestive) heart failure: Secondary | ICD-10-CM | POA: Diagnosis not present

## 2019-10-01 DIAGNOSIS — G8929 Other chronic pain: Secondary | ICD-10-CM | POA: Diagnosis not present

## 2019-10-01 DIAGNOSIS — T8781 Dehiscence of amputation stump: Secondary | ICD-10-CM | POA: Diagnosis not present

## 2019-10-01 NOTE — Chronic Care Management (AMB) (Signed)
  Chronic Care Management   Note  10/01/2019 Name: [name] [name] MRN: [id number] DOB: 09/01/1975   Successful outreach to patient for [facility] Discharge Red Flags (2)   Patient says she does not have unfilled prescriptions but is requesting [facility] for assistance with dressing changes and to supply  dressing supplies for her dehiscenced wound of left amputation stump . Called [facility] as [facility] referral was ordered at hospital discharge on 5/6. Per Alvis Lemmings, referral was for HHPT/OT only and therapy services are scheduled to start today.   Follow up plan: Will contact provider to ask about [facility] referral for dressing changes.  Kelli Churn RN, CCM, [facility] RN Care Manager [phone]

## 2019-10-02 ENCOUNTER — Telehealth: Payer: Self-pay | Admitting: Physician Assistant

## 2019-10-02 ENCOUNTER — Ambulatory Visit: Payer: Self-pay | Admitting: *Deleted

## 2019-10-02 DIAGNOSIS — I152 Hypertension secondary to endocrine disorders: Secondary | ICD-10-CM

## 2019-10-02 DIAGNOSIS — T8781 Dehiscence of amputation stump: Secondary | ICD-10-CM | POA: Diagnosis not present

## 2019-10-02 DIAGNOSIS — I5030 Unspecified diastolic (congestive) heart failure: Secondary | ICD-10-CM | POA: Diagnosis not present

## 2019-10-02 DIAGNOSIS — D631 Anemia in chronic kidney disease: Secondary | ICD-10-CM | POA: Diagnosis not present

## 2019-10-02 DIAGNOSIS — E1142 Type 2 diabetes mellitus with diabetic polyneuropathy: Secondary | ICD-10-CM | POA: Diagnosis not present

## 2019-10-02 DIAGNOSIS — G8929 Other chronic pain: Secondary | ICD-10-CM | POA: Diagnosis not present

## 2019-10-02 DIAGNOSIS — E1159 Type 2 diabetes mellitus with other circulatory complications: Secondary | ICD-10-CM

## 2019-10-02 DIAGNOSIS — I130 Hypertensive heart and chronic kidney disease with heart failure and stage 1 through stage 4 chronic kidney disease, or unspecified chronic kidney disease: Secondary | ICD-10-CM | POA: Diagnosis not present

## 2019-10-02 DIAGNOSIS — M797 Fibromyalgia: Secondary | ICD-10-CM | POA: Diagnosis not present

## 2019-10-02 DIAGNOSIS — E1122 Type 2 diabetes mellitus with diabetic chronic kidney disease: Secondary | ICD-10-CM | POA: Diagnosis not present

## 2019-10-02 DIAGNOSIS — N183 Chronic kidney disease, stage 3 unspecified: Secondary | ICD-10-CM | POA: Diagnosis not present

## 2019-10-02 NOTE — Chronic Care Management (AMB) (Signed)
  Chronic Care Management   Note  10/02/2019 Name: [name] [name] MRN: [id number] DOB: August 02, 1975  [facility], they will be able to provide [facility] for dressing changes with anticipated start of care on 10/07/19. In [employer] sent to Dr. Tarri Abernethy requesting referral for [facility].   Follow up plan: The care management team will reach out to the patient again over the next 7 days.   Kelli Churn RN, CCM, [facility] RN Care Manager [phone]

## 2019-10-02 NOTE — Telephone Encounter (Signed)
Patient called advised she need supplies for dressing changes. The number to contact patient is [phone]

## 2019-10-03 ENCOUNTER — Encounter: Payer: Medicare HMO | Admitting: Internal Medicine

## 2019-10-03 ENCOUNTER — Telehealth: Payer: Self-pay

## 2019-10-03 ENCOUNTER — Ambulatory Visit: Payer: Self-pay | Admitting: *Deleted

## 2019-10-03 DIAGNOSIS — E1159 Type 2 diabetes mellitus with other circulatory complications: Secondary | ICD-10-CM

## 2019-10-03 NOTE — Telephone Encounter (Signed)
In addition to updated orders can you also write for home heath ot orders supplies for this pt. She is coming in tomorrow.

## 2019-10-03 NOTE — Progress Notes (Signed)
Home health referral to assist with dressing changes placed.   Ina Homes, MD

## 2019-10-03 NOTE — Telephone Encounter (Signed)
Sre, PT with [facility] would like a nursing order for wound dressing and an order for a Education officer, museum.  Cb# [phone].  Please advise.  Thank you.

## 2019-10-03 NOTE — Addendum Note (Signed)
Addended byIna Homes T on: 10/03/2019 07:58 AM   Modules accepted: Orders

## 2019-10-03 NOTE — Telephone Encounter (Signed)
HHn calling for updated orders for BKA and also allying Profor on right please advise tomorrow after visit.

## 2019-10-03 NOTE — Chronic Care Management (AMB) (Signed)
  Chronic Care Management   Note  10/03/2019 Name: [name] [name] MRN: [id number] DOB: 11/15/1975  Referral for [facility] for dressing changes faxed to [facility], anticipated start of care is 10/07/19. Notified patient of [facility] services order and suggested she ask for dressing supplies at her appointment with Dr. Sharol Given tomorrow until the [facility] can provide her with supplies.  Follow up plan: No further follow up required as patient does not wish to participate in chronic care management program.   Kelli Churn RN, CCM, [facility] RN Care Manager [phone]

## 2019-10-04 ENCOUNTER — Other Ambulatory Visit: Payer: Self-pay

## 2019-10-04 ENCOUNTER — Ambulatory Visit (INDEPENDENT_AMBULATORY_CARE_PROVIDER_SITE_OTHER): Payer: Medicare HMO | Admitting: Physician Assistant

## 2019-10-04 ENCOUNTER — Encounter: Payer: Self-pay | Admitting: Physician Assistant

## 2019-10-04 DIAGNOSIS — T8781 Dehiscence of amputation stump: Secondary | ICD-10-CM

## 2019-10-04 NOTE — Progress Notes (Signed)
Office Visit Note   Patient: [name] [name]           Date of Birth: 04/22/1976           MRN: [id number] Visit Date: 10/04/2019              Requested by: Ina Homes, MD [address] [place],  [place] [address] PCP: Ina Homes, MD  Chief Complaint  Patient presents with  . Left Leg - Routine Post Op      HPI: This is a pleasant woman who is 2 weeks status post left below-knee amputation revision.  She has also had significant swelling and on her right side we did do some Profore wrapping.  With regards to the right side she does not want this wrapped this week.  On the left nursing is supposed to start dressing changes over the weekend  Assessment & Plan: Visit Diagnoses: No diagnosis found.  Plan: We will defer the Profore wrapping though I think she would still benefit from this on the right.  With regards to the left I have concerns for healing in the area that has dehisced also wound edges do not appear overall well opposed.  She will come in on Tuesday afternoon and follow-up with Dr. Sharol Given  Follow-Up Instructions: No follow-ups on file.   Ortho Exam  Patient is alert, oriented, no adenopathy, well-dressed, normal affect, normal respiratory effort. Right lower extremity moderate soft tissue swelling no open areas or ulcers.  No cellulitis.  Left below-knee amputation stump: She does have a significant wound dehiscence that measures 4 x 4 cm and is 4 cm deep.  There is no foul odor or no purulent drainage.  No surrounding erythema.  The rest of the wound looks somewhat macerated and slightly dehisced with unopposed wound edges mild serous drainage she does have a moderate amount of soft tissue swelling overall in her stump.  Imaging: No results found. No images are attached to the encounter.  Labs: Lab Results  Component Value Date   HGBA1C 8.5 (H) 09/04/2019   HGBA1C 7.0 (A) 05/30/2019   HGBA1C 6.6 (A) 02/28/2019   ESRSEDRATE 130 (H) 03/24/2017   ESRSEDRATE 53 (H) 09/14/2015   ESRSEDRATE 132 (H) 07/06/2012   CRP 13.6 (H) 02/14/2019   CRP 38.6 (H) 04/03/2018   CRP 5.4 (H) 09/14/2015   LABURIC 8.3 (H) 04/15/2019   REPTSTATUS 09/09/2019 FINAL 09/04/2019   GRAMSTAIN  09/04/2019    FEW WBC PRESENT, PREDOMINANTLY PMN MODERATE GRAM POSITIVE COCCI    CULT  09/04/2019    ABUNDANT STREPTOCOCCUS GROUP G Beta hemolytic streptococci are predictably susceptible to penicillin and other beta lactams. Susceptibility testing not routinely performed. FEW STAPHYLOCOCCUS AUREUS NO ANAEROBES ISOLATED Performed at [facility], [place] [address], [place], [place] [address]    LABORGA STAPHYLOCOCCUS AUREUS 09/04/2019     Lab Results  Component Value Date   ALBUMIN 2.3 (L) 09/18/2019   ALBUMIN 1.8 (L) 09/01/2019   ALBUMIN 2.1 (L) 02/12/2019   LABURIC 8.3 (H) 04/15/2019    Lab Results  Component Value Date   MG 2.3 04/07/2018   MG 2.1 04/05/2018   MG 2.4 03/25/2017   No results found for: VD25OH  No results found for: PREALBUMIN CBC EXTENDED Latest Ref Rng & Units 09/26/2019 09/25/2019 09/24/2019  WBC 4.0 - 10.5 K/uL 5.5 4.6 6.2  RBC 3.87 - 5.11 MIL/uL 2.88(L) 2.89(L) 3.22(L)  HGB 12.0 - 15.0 g/dL 7.6(L) 7.7(L) 8.7(L)  HCT 36.0 -  46.0 % 24.6(L) 24.4(L) 28.0(L)  PLT 150 - 400 K/uL 256 262 252  NEUTROABS 1.7 - 7.7 K/uL - - -  LYMPHSABS 0.7 - 4.0 K/uL - - -     There is no height or weight on file to calculate BMI.  Orders:  No orders of the defined types were placed in this encounter.  No orders of the defined types were placed in this encounter.    Procedures: No procedures performed  Clinical Data: No additional findings.  ROS:  All other systems negative, except as noted in the HPI. Review of Systems  Objective: Vital Signs: There were no vitals taken for this visit.  Specialty Comments:  No specialty comments available.  PMFS History: Patient Active Problem List   Diagnosis Date Noted  . Wound dehiscence  09/04/2019  . Right carpal tunnel syndrome 06/14/2019  . Left carpal tunnel syndrome 06/14/2019  . Tobacco use 03/01/2019  . Phantom limb pain ([place]) 11/08/2018  . Current moderate episode of major depressive disorder ([place])   . Anemia of chronic disease   . Severe protein-calorie malnutrition ([place])   . Dehiscence of amputation stump ([place])   . Fall   . Status post below knee amputation, left ([place]) 04/11/2017  . Bipolar affective disorder ([place])   . Adjustment disorder with depressed mood 03/26/2017  . Heart failure with preserved ejection fraction ([place]) 02/09/2017  . Routine adult health maintenance 12/08/2016  . Chronic pain of right knee 09/08/2016  . Chronic kidney disease (CKD), stage III (moderate) 07/15/2016  . OSA (obstructive sleep apnea) 05/13/2016  . Morbid obesity due to excess calories ([place]) 11/27/2015  . Hypothyroidism 04/25/2013  . Controlled type 2 diabetes mellitus ([place]) 07/12/2012  . Carpal tunnel syndrome, bilateral 01/25/2012  . Diabetic polyneuropathy associated with type 2 diabetes mellitus ([place]) 07/04/2011  . Anxiety and depression 06/02/2011  . GERD 12/06/2007  . Hyperlipidemia 08/29/2007  . Hypertension associated with diabetes ([place]) 08/29/2007   Past Medical History:  Diagnosis Date  . Abdominal muscle pain 09/08/2016  . Abnormal Pap smear of cervix 2009  . Abscess    history of multiple abscesses  . Acute bilateral low back pain 02/13/2017  . Acute blood loss anemia   . Acute renal failure ([place]) 07/12/2012  . AKI (acute kidney injury) ([place])   . Anemia of chronic disease 2002  . Anxiety    Panic attacks  . Bilateral lower extremity edema 05/13/2016  . Bipolar disorder ([place])   . Cellulitis 05/21/2014   right eye  . Chronic bronchitis ([place])    "get it q yr" (05/13/2013)  . Chronic pain   . Depression   . Edema of lower extremity   . Endocarditis 2002   subacute bacterial endocarditis.   . Family history of anesthesia complication    "my mom has a  hard time coming out from under"  . Fibromyalgia   . GERD (gastroesophageal reflux disease)    occ  . Heart murmur   . Herpes simplex type 1 infection 01/16/2018  . History of blood transfusion    "just low blood count" (05/13/2013)  . Hyperlipidemia   . Hypertension   . Hypothyroidism   . Hypothyroidism, adult 03/21/2014  . Leukocytosis   . Necrosis ([place])    and ulceration  . Necrotizing fasciitis s/p OR debridements 07/06/2012  . Obesity   . OSA on CPAP    does not wear all the time  . Peripheral neuropathy   . Type II diabetes mellitus (  [place])    Type  II    Family History  Problem Relation Age of Onset  . Heart failure Mother   . Diabetes Mother   . Kidney disease Mother   . Kidney disease Father   . Diabetes Father   . Diabetes Paternal Grandmother   . Heart failure Paternal Grandmother   . Other Other     Past Surgical History:  Procedure Laterality Date  . ABDOMINAL AORTOGRAM W/LOWER EXTREMITY Left 03/19/2019   Procedure: ABDOMINAL AORTOGRAM W/LOWER EXTREMITY;  Surgeon: Serafina Mitchell, MD;  Location: [facility];  Service: Cardiovascular;  Laterality: Left;  . AMPUTATION Left 04/07/2017   Procedure: LEFT BELOW KNEE AMPUTATION;  Surgeon: Newt Minion, MD;  Location: [employer] [facility];  Service: Orthopedics;  Laterality: Left;  . EYE SURGERY     lazer  . INCISION AND DRAINAGE ABSCESS     multiple I&Ds  . INCISION AND DRAINAGE ABSCESS Left 07/09/2012   Procedure: DRESSING CHANGE, THIGH WOUND;  Surgeon: Harl Bowie, MD;  Location: [facility];  Service: General;  Laterality: Left;  . INCISION AND DRAINAGE OF WOUND Left 07/07/2012   Procedure: IRRIGATION AND DEBRIDEMENT WOUND;  Surgeon: Harl Bowie, MD;  Location: [facility];  Service: General;  Laterality: Left;  . INCISION AND DRAINAGE PERIRECTAL ABSCESS Left 07/14/2012   Procedure: DEBRIDEMENT OF SKIN & SOFT TISSUE; DRESSING CHANGE UNDER ANESTHESIA;  Surgeon: [place] Curry, MD,FACS;  Location: [facility];  Service:  General;  Laterality: Left;  . INCISION AND DRAINAGE PERIRECTAL ABSCESS Left 07/16/2012   Procedure: I&D Left Thigh;  Surgeon: Gwenyth Ober, MD;  Location: [facility];  Service: General;  Laterality: Left;  . INCISION AND DRAINAGE PERIRECTAL ABSCESS N/A 01/05/2015   Procedure: IRRIGATION AND DEBRIDEMENT PERIRECTAL ABSCESS;  Surgeon: Donnie Mesa, MD;  Location: [facility];  Service: General;  Laterality: N/A;  . IRRIGATION AND DEBRIDEMENT ABSCESS Left 07/06/2012   Procedure: IRRIGATION AND DEBRIDEMENT ABSCESS BUTTOCKS AND THIGH;  Surgeon: Shann Medal, MD;  Location: [facility];  Service: General;  Laterality: Left;  . IRRIGATION AND DEBRIDEMENT ABSCESS Left 08/10/2012   Procedure: IRRIGATION AND DEBRIDEMENT ABSCESS;  Surgeon: Madilyn Hook, DO;  Location: [facility];  Service: General;  Laterality: Left;  . STUMP REVISION Left 06/09/2017   Procedure: REVISION LEFT BELOW KNEE AMPUTATION;  Surgeon: Newt Minion, MD;  Location: [facility];  Service: Orthopedics;  Laterality: Left;  . STUMP REVISION Left 09/04/2019   Procedure: LEFT BELOW KNEE AMPUTATION REVISION;  Surgeon: Newt Minion, MD;  Location: [facility];  Service: Orthopedics;  Laterality: Left;  . STUMP REVISION Left 09/20/2019   Procedure: LEFT BELOW KNEE AMPUTATION REVISION;  Surgeon: Newt Minion, MD;  Location: [facility];  Service: Orthopedics;  Laterality: Left;  . TONSILLECTOMY  1994   Social History   Occupational History  . Occupation: disability  Tobacco Use  . Smoking status: Current Every Day Smoker    Packs/day: 0.50    Years: 16.00    Pack years: 8.00    Types: Cigarettes  . Smokeless tobacco: Never Used  . Tobacco comment: 1 PPD  Substance and Sexual Activity  . Alcohol use: Yes    Alcohol/week: 0.0 standard drinks    Comment: Socially-" monthly  maybe"  . Drug use: No  . Sexual activity: Yes

## 2019-10-07 ENCOUNTER — Ambulatory Visit (INDEPENDENT_AMBULATORY_CARE_PROVIDER_SITE_OTHER): Payer: Medicare HMO | Admitting: Internal Medicine

## 2019-10-07 ENCOUNTER — Encounter: Payer: Self-pay | Admitting: Internal Medicine

## 2019-10-07 ENCOUNTER — Telehealth: Payer: Self-pay

## 2019-10-07 VITALS — BP 187/99 | HR 89 | Temp 98.3°F

## 2019-10-07 DIAGNOSIS — D631 Anemia in chronic kidney disease: Secondary | ICD-10-CM | POA: Diagnosis not present

## 2019-10-07 DIAGNOSIS — T8130XA Disruption of wound, unspecified, initial encounter: Secondary | ICD-10-CM

## 2019-10-07 DIAGNOSIS — I129 Hypertensive chronic kidney disease with stage 1 through stage 4 chronic kidney disease, or unspecified chronic kidney disease: Secondary | ICD-10-CM | POA: Diagnosis not present

## 2019-10-07 DIAGNOSIS — H538 Other visual disturbances: Secondary | ICD-10-CM

## 2019-10-07 DIAGNOSIS — I130 Hypertensive heart and chronic kidney disease with heart failure and stage 1 through stage 4 chronic kidney disease, or unspecified chronic kidney disease: Secondary | ICD-10-CM | POA: Diagnosis not present

## 2019-10-07 DIAGNOSIS — T8781 Dehiscence of amputation stump: Secondary | ICD-10-CM | POA: Diagnosis not present

## 2019-10-07 DIAGNOSIS — N183 Chronic kidney disease, stage 3 unspecified: Secondary | ICD-10-CM | POA: Diagnosis not present

## 2019-10-07 DIAGNOSIS — E1159 Type 2 diabetes mellitus with other circulatory complications: Secondary | ICD-10-CM | POA: Diagnosis not present

## 2019-10-07 DIAGNOSIS — G8929 Other chronic pain: Secondary | ICD-10-CM | POA: Diagnosis not present

## 2019-10-07 DIAGNOSIS — E1122 Type 2 diabetes mellitus with diabetic chronic kidney disease: Secondary | ICD-10-CM | POA: Diagnosis not present

## 2019-10-07 DIAGNOSIS — Z89512 Acquired absence of left leg below knee: Secondary | ICD-10-CM

## 2019-10-07 DIAGNOSIS — D638 Anemia in other chronic diseases classified elsewhere: Secondary | ICD-10-CM | POA: Diagnosis not present

## 2019-10-07 DIAGNOSIS — M797 Fibromyalgia: Secondary | ICD-10-CM | POA: Diagnosis not present

## 2019-10-07 DIAGNOSIS — E1142 Type 2 diabetes mellitus with diabetic polyneuropathy: Secondary | ICD-10-CM | POA: Diagnosis not present

## 2019-10-07 DIAGNOSIS — N1832 Chronic kidney disease, stage 3b: Secondary | ICD-10-CM

## 2019-10-07 DIAGNOSIS — I5030 Unspecified diastolic (congestive) heart failure: Secondary | ICD-10-CM | POA: Diagnosis not present

## 2019-10-07 MED ORDER — METOPROLOL SUCCINATE ER 25 MG PO TB24
12.5000 mg | ORAL_TABLET | Freq: Every day | ORAL | Status: DC
Start: 2019-10-07 — End: 2019-11-07

## 2019-10-07 MED ORDER — LISINOPRIL 10 MG PO TABS
10.0000 mg | ORAL_TABLET | Freq: Every day | ORAL | Status: DC
Start: 2019-10-07 — End: 2019-11-12

## 2019-10-07 MED ORDER — AMLODIPINE BESYLATE 10 MG PO TABS
10.0000 mg | ORAL_TABLET | Freq: Every day | ORAL | Status: DC
Start: 2019-10-07 — End: 2022-02-15

## 2019-10-07 NOTE — Telephone Encounter (Signed)
Called [facility]. No answer. LMOM with detailed message.

## 2019-10-07 NOTE — Assessment & Plan Note (Signed)
  Patient was started on sodium bicarb and will, during hospitalization due to hyperkalemia.  This visit she states that she has been having nausea without any vomiting.  -We will check BMP -Follow-up with nephrology

## 2019-10-07 NOTE — Progress Notes (Signed)
   CC: Wound dehiscence follow up   HPI:  [name] [name] is a 44 y.o.    Patient was admitted from 4/28-5/6 for left leg BKA wound dehiscence after tripping over a parking block and hitting the end of her stump.  BKA revision was done on 2/44 without complication.  Past Medical History:  Diagnosis Date  . Abdominal muscle pain 09/08/2016  . Abnormal Pap smear of cervix 2009  . Abscess    history of multiple abscesses  . Acute bilateral low back pain 02/13/2017  . Acute blood loss anemia   . Acute renal failure ([place]) 07/12/2012  . AKI (acute kidney injury) ([place])   . Anemia of chronic disease 2002  . Anxiety    Panic attacks  . Bilateral lower extremity edema 05/13/2016  . Bipolar disorder ([place])   . Cellulitis 05/21/2014   right eye  . Chronic bronchitis ([place])    "get it q yr" (05/13/2013)  . Chronic pain   . Depression   . Edema of lower extremity   . Endocarditis 2002   subacute bacterial endocarditis.   . Family history of anesthesia complication    "my mom has a hard time coming out from under"  . Fibromyalgia   . GERD (gastroesophageal reflux disease)    occ  . Heart murmur   . Herpes simplex type 1 infection 01/16/2018  . History of blood transfusion    "just low blood count" (05/13/2013)  . Hyperlipidemia   . Hypertension   . Hypothyroidism   . Hypothyroidism, adult 03/21/2014  . Leukocytosis   . Necrosis ([place])    and ulceration  . Necrotizing fasciitis s/p OR debridements 07/06/2012  . Obesity   . OSA on CPAP    does not wear all the time  . Peripheral neuropathy   . Type II diabetes mellitus (HCC)    Type  II   Review of Systems:    Denies fever, chills, vomiting Has nausea  Physical Exam:  Vitals:   10/07/19 1024  BP: (!) 180/97  Pulse: 87  Temp: 98.3 F (36.8 C)  TempSrc: Oral  SpO2: 100%   Physical Exam  Constitutional: She appears well-developed and well-nourished. No distress.  Eyes: Pupils are equal, round, and reactive to  light. Conjunctivae are normal. Right eye exhibits no discharge. Left eye exhibits no discharge. No scleral icterus.  Cardiovascular: Normal rate, regular rhythm and normal heart sounds.  Respiratory: Effort normal and breath sounds normal. No respiratory distress. She has no wheezes.  Skin: She is not diaphoretic.  Wound dehiscence of right lower extremity stump. Localized bleeding, no purulent drainage. No significant swelling, erythema, or warmth around wound.          Assessment & Plan:   See Encounters Tab for problem based charting.  Patient discussed with Dr. Rebeca Alert'

## 2019-10-07 NOTE — Assessment & Plan Note (Signed)
Patient had a drop in her hemoglobin down to 6.4 during hospitalization was given 2 units PRBC during hospitalization.   -Repeat CBC

## 2019-10-07 NOTE — Patient Instructions (Signed)
It was a pleasure to see you today [name] [name]. Please make the following changes:  -Please restart taking your blood pressure medication-amlodipine, metoprolol, lisinopril 1 day at a time -These follow-up with Dr. Sharol Given -Please follow-up with nephrology -An ophthalmology referral was made for your blurry vision  If you have any questions or concerns, please call our clinic at [phone] between 9am-5pm and after hours call [phone] and ask for the internal medicine resident on call. If you feel you are having a medical emergency please call 911.   Thank you, we look forward to help you remain healthy!  Lars Mage, MD Internal Medicine PGY3

## 2019-10-07 NOTE — Assessment & Plan Note (Signed)
Patient's blood pressure during this visit was 180/97.  Patient's amlodipine 10mg  qd, metoprool 12.5mg  qd, lisinopril 10mg  qd was held during hospitalization.  Assessment and plan Due to patient's uncontrolled blood pressure during this visit encourage the patient to restart her medications 1 day at a time.

## 2019-10-07 NOTE — Assessment & Plan Note (Addendum)
Was reevaluated by orthopedics on 09/30/2019 at which time the patient was told to do left daily dressing changes and was prescribed doxycycline 100 mg twice daily.  She was seen again on 10/04/2019 at which time there was concerns for poor wound healing as the wound edges do not seem well opposed.  She is to follow-up with Dr. Sharol Given on 5/18  Assessment and plan Patient's wound appears to be clear without any purulent discharge.  There is no excessive warmth of extremity.  We will check CBC for leukocytosis.  She is afebrile.    -Follow-up with orthopedics -Patient to continue using chronic oxycodone prescribed by pcp for pain control. She states that she usually only requires 1-2 tablets daily, but due to the recent trauma she is requiring every 4 hours.

## 2019-10-07 NOTE — Telephone Encounter (Signed)
Recommend wash the incision with soap and water daily apply dry dressing plus an Ace wrap.

## 2019-10-07 NOTE — Telephone Encounter (Signed)
Denise with [facility] would like to clarify dressing for patient's left stump?  Cb# [phone].  Please advise.  Thank you.

## 2019-10-08 ENCOUNTER — Other Ambulatory Visit: Payer: Self-pay

## 2019-10-08 ENCOUNTER — Ambulatory Visit (INDEPENDENT_AMBULATORY_CARE_PROVIDER_SITE_OTHER): Payer: Medicare HMO | Admitting: Physician Assistant

## 2019-10-08 ENCOUNTER — Ambulatory Visit: Payer: Self-pay | Admitting: *Deleted

## 2019-10-08 ENCOUNTER — Other Ambulatory Visit: Payer: Self-pay | Admitting: Internal Medicine

## 2019-10-08 ENCOUNTER — Encounter: Payer: Self-pay | Admitting: Physician Assistant

## 2019-10-08 DIAGNOSIS — T8781 Dehiscence of amputation stump: Secondary | ICD-10-CM

## 2019-10-08 DIAGNOSIS — H538 Other visual disturbances: Secondary | ICD-10-CM | POA: Insufficient documentation

## 2019-10-08 DIAGNOSIS — N1832 Chronic kidney disease, stage 3b: Secondary | ICD-10-CM

## 2019-10-08 DIAGNOSIS — Z89512 Acquired absence of left leg below knee: Secondary | ICD-10-CM

## 2019-10-08 DIAGNOSIS — E1142 Type 2 diabetes mellitus with diabetic polyneuropathy: Secondary | ICD-10-CM

## 2019-10-08 LAB — CBC
Hematocrit: 26 % — ABNORMAL LOW (ref 34.0–46.6)
Hemoglobin: 8.2 g/dL — ABNORMAL LOW (ref 11.1–15.9)
MCH: 25.4 pg — ABNORMAL LOW (ref 26.6–33.0)
MCHC: 31.5 g/dL (ref 31.5–35.7)
MCV: 81 fL (ref 79–97)
Platelets: 346 10*3/uL (ref 150–450)
RBC: 3.23 x10E6/uL — ABNORMAL LOW (ref 3.77–5.28)
RDW: 13.7 % (ref 11.7–15.4)
WBC: 7.5 10*3/uL (ref 3.4–10.8)

## 2019-10-08 LAB — BMP8+ANION GAP
Anion Gap: 12 mmol/L (ref 10.0–18.0)
BUN/Creatinine Ratio: 14 (ref 9–23)
BUN: 29 mg/dL — ABNORMAL HIGH (ref 6–24)
CO2: 20 mmol/L (ref 20–29)
Calcium: 9 mg/dL (ref 8.7–10.2)
Chloride: 108 mmol/L — ABNORMAL HIGH (ref 96–106)
Creatinine, Ser: 2.02 mg/dL — ABNORMAL HIGH (ref 0.57–1.00)
GFR calc Af Amer: 34 mL/min/{1.73_m2} — ABNORMAL LOW (ref >59)
GFR calc non Af Amer: 30 mL/min/{1.73_m2} — ABNORMAL LOW (ref >59)
Glucose: 103 mg/dL — ABNORMAL HIGH (ref 65–99)
Potassium: 5.5 mmol/L — ABNORMAL HIGH (ref 3.5–5.2)
Sodium: 140 mmol/L (ref 134–144)

## 2019-10-08 NOTE — Addendum Note (Signed)
Addended by: Hulan Fray on: 10/08/2019 05:51 PM   Modules accepted: Orders

## 2019-10-08 NOTE — Chronic Care Management (AMB) (Signed)
  Chronic Care Management   Note  10/08/2019 Name: [name] [name] MRN: [id number] DOB: 12-28-1975  Successful outreach to patient to verify start of services for [facility] via [place]. Patient states [facility] services started yesterday 10/07/19.  Follow up plan: No further follow up required: as patient does not wish to participate in CCM program a this time  Kelli Churn RN, CCM, [facility] RN Care Manager [phone]

## 2019-10-08 NOTE — Progress Notes (Signed)
Post-Op Visit Note   Patient: [name] [name]           Date of Birth: November 28, 1975           MRN: [id number] Visit Date: 10/08/2019 PCP: Ina Homes, MD  Chief Complaint:  Chief Complaint  Patient presents with  . Left Leg - Follow-up    HPI:  HPI The patient is a 44 year old woman seen today status post revision left below knee amputation.  Unfortunately this has dehisced again.  She has been packing the areas of dehiscence open with gauze.  Her sutures remain in place.  Ortho Exam On examination of the left below knee amputation there is significant dehiscence. Sutures are in place. Laterally incision is gaped open a length of 5 cm, 3 cm wide with 2 cm of depth. Blood clot in wound base. Medially has gaped 5 cm as well this is 3 cm deep. Clear brown drainage. Inadequate soft tissue for further revision surgery below knee.   Visit Diagnoses:  1. Dehiscence of amputation stump ([place])   2. Status post below knee amputation, left ([place])     Plan: wounds packed open with gauze. Dr Sharol Given has seen and is in agreement with plan. Will plan for above knee amputation. Patient in agreement with plan. Plan for this, this week.  Follow-Up Instructions: No follow-ups on file.   Imaging: No results found.  Orders:  No orders of the defined types were placed in this encounter.  No orders of the defined types were placed in this encounter.    PMFS History: Patient Active Problem List   Diagnosis Date Noted  . Blurry vision, right eye 10/08/2019  . Wound dehiscence 09/04/2019  . Right carpal tunnel syndrome 06/14/2019  . Left carpal tunnel syndrome 06/14/2019  . Tobacco use 03/01/2019  . Phantom limb pain ([place]) 11/08/2018  . Current moderate episode of major depressive disorder ([place])   . Anemia of chronic disease   . Severe protein-calorie malnutrition ([place])   . Dehiscence of amputation stump ([place])   . Fall   . Status post below knee amputation, left ([place]) 04/11/2017  .  Bipolar affective disorder ([place])   . Adjustment disorder with depressed mood 03/26/2017  . Heart failure with preserved ejection fraction ([place]) 02/09/2017  . Routine adult health maintenance 12/08/2016  . Chronic pain of right knee 09/08/2016  . Chronic kidney disease (CKD), stage III (moderate) 07/15/2016  . OSA (obstructive sleep apnea) 05/13/2016  . Morbid obesity due to excess calories ([place]) 11/27/2015  . Hypothyroidism 04/25/2013  . Controlled type 2 diabetes mellitus ([place]) 07/12/2012  . Carpal tunnel syndrome, bilateral 01/25/2012  . Diabetic polyneuropathy associated with type 2 diabetes mellitus ([place]) 07/04/2011  . Anxiety and depression 06/02/2011  . GERD 12/06/2007  . Hyperlipidemia 08/29/2007  . Hypertension associated with diabetes ([place]) 08/29/2007   Past Medical History:  Diagnosis Date  . Abdominal muscle pain 09/08/2016  . Abnormal Pap smear of cervix 2009  . Abscess    history of multiple abscesses  . Acute bilateral low back pain 02/13/2017  . Acute blood loss anemia   . Acute renal failure ([place]) 07/12/2012  . AKI (acute kidney injury) ([place])   . Anemia of chronic disease 2002  . Anxiety    Panic attacks  . Bilateral lower extremity edema 05/13/2016  . Bipolar disorder ([place])   . Cellulitis 05/21/2014   right eye  . Chronic bronchitis ([place])    "get it q yr" (05/13/2013)  .  Chronic pain   . Depression   . Edema of lower extremity   . Endocarditis 2002   subacute bacterial endocarditis.   . Family history of anesthesia complication    "my mom has a hard time coming out from under"  . Fibromyalgia   . GERD (gastroesophageal reflux disease)    occ  . Heart murmur   . Herpes simplex type 1 infection 01/16/2018  . History of blood transfusion    "just low blood count" (05/13/2013)  . Hyperlipidemia   . Hypertension   . Hypothyroidism   . Hypothyroidism, adult 03/21/2014  . Leukocytosis   . Necrosis ([place])    and ulceration  . Necrotizing fasciitis s/p OR  debridements 07/06/2012  . Obesity   . OSA on CPAP    does not wear all the time  . Peripheral neuropathy   . Type II diabetes mellitus (HCC)    Type  II    Family History  Problem Relation Age of Onset  . Heart failure Mother   . Diabetes Mother   . Kidney disease Mother   . Kidney disease Father   . Diabetes Father   . Diabetes Paternal Grandmother   . Heart failure Paternal Grandmother   . Other Other     Past Surgical History:  Procedure Laterality Date  . ABDOMINAL AORTOGRAM W/LOWER EXTREMITY Left 03/19/2019   Procedure: ABDOMINAL AORTOGRAM W/LOWER EXTREMITY;  Surgeon: Serafina Mitchell, MD;  Location: [facility];  Service: Cardiovascular;  Laterality: Left;  . AMPUTATION Left 04/07/2017   Procedure: LEFT BELOW KNEE AMPUTATION;  Surgeon: Newt Minion, MD;  Location: [facility];  Service: Orthopedics;  Laterality: Left;  . EYE SURGERY     lazer  . INCISION AND DRAINAGE ABSCESS     multiple I&Ds  . INCISION AND DRAINAGE ABSCESS Left 07/09/2012   Procedure: DRESSING CHANGE, THIGH WOUND;  Surgeon: Harl Bowie, MD;  Location: [facility];  Service: General;  Laterality: Left;  . INCISION AND DRAINAGE OF WOUND Left 07/07/2012   Procedure: IRRIGATION AND DEBRIDEMENT WOUND;  Surgeon: Harl Bowie, MD;  Location: [facility];  Service: General;  Laterality: Left;  . INCISION AND DRAINAGE PERIRECTAL ABSCESS Left 07/14/2012   Procedure: DEBRIDEMENT OF SKIN & SOFT TISSUE; DRESSING CHANGE UNDER ANESTHESIA;  Surgeon: Gayland Curry, MD,FACS;  Location: [facility];  Service: General;  Laterality: Left;  . INCISION AND DRAINAGE PERIRECTAL ABSCESS Left 07/16/2012   Procedure: I&D Left Thigh;  Surgeon: Gwenyth Ober, MD;  Location: [facility];  Service: General;  Laterality: Left;  . INCISION AND DRAINAGE PERIRECTAL ABSCESS N/A 01/05/2015   Procedure: IRRIGATION AND DEBRIDEMENT PERIRECTAL ABSCESS;  Surgeon: Donnie Mesa, MD;  Location: [facility];  Service: General;  Laterality: N/A;  . IRRIGATION AND  DEBRIDEMENT ABSCESS Left 07/06/2012   Procedure: IRRIGATION AND DEBRIDEMENT ABSCESS BUTTOCKS AND THIGH;  Surgeon: Shann Medal, MD;  Location: [place] [place];  Service: General;  Laterality: Left;  . IRRIGATION AND DEBRIDEMENT ABSCESS Left 08/10/2012   Procedure: IRRIGATION AND DEBRIDEMENT ABSCESS;  Surgeon: Madilyn Hook, DO;  Location: [facility];  Service: General;  Laterality: Left;  . STUMP REVISION Left 06/09/2017   Procedure: REVISION LEFT BELOW KNEE AMPUTATION;  Surgeon: Newt Minion, MD;  Location: [facility];  Service: Orthopedics;  Laterality: Left;  . STUMP REVISION Left 09/04/2019   Procedure: LEFT BELOW KNEE AMPUTATION REVISION;  Surgeon: Newt Minion, MD;  Location: [facility];  Service: Orthopedics;  Laterality: Left;  .  STUMP REVISION Left 09/20/2019   Procedure: LEFT BELOW KNEE AMPUTATION REVISION;  Surgeon: Newt Minion, MD;  Location: [facility];  Service: Orthopedics;  Laterality: Left;  . TONSILLECTOMY  1994   Social History   Occupational History  . Occupation: disability  Tobacco Use  . Smoking status: Former Smoker    Packs/day: 0.50    Years: 16.00    Pack years: 8.00    Types: Cigarettes  . Smokeless tobacco: Former Systems developer    Quit date: 09/16/2019  . Tobacco comment: quit smoking 3wks ago  Substance and Sexual Activity  . Alcohol use: Yes    Alcohol/week: 0.0 standard drinks    Comment: Socially-" monthly  maybe"  . Drug use: No  . Sexual activity: Yes

## 2019-10-08 NOTE — Assessment & Plan Note (Signed)
Patient states that she has been seeing a layer of fluid in her right eye over the past few weeks.  States that this happened before and was told that it was diabetic retinopathy.  Assessment and plan Extraocular muscles are intact and pupils were round and reactive to light.  Refer to ophthalmology for follow-up.

## 2019-10-08 NOTE — Telephone Encounter (Signed)
Last refill of Oxy was 09/26/19.

## 2019-10-09 ENCOUNTER — Other Ambulatory Visit: Payer: Self-pay | Admitting: Physician Assistant

## 2019-10-09 DIAGNOSIS — N183 Chronic kidney disease, stage 3 unspecified: Secondary | ICD-10-CM | POA: Diagnosis not present

## 2019-10-09 DIAGNOSIS — E1142 Type 2 diabetes mellitus with diabetic polyneuropathy: Secondary | ICD-10-CM | POA: Diagnosis not present

## 2019-10-09 DIAGNOSIS — T8781 Dehiscence of amputation stump: Secondary | ICD-10-CM | POA: Diagnosis not present

## 2019-10-09 DIAGNOSIS — M797 Fibromyalgia: Secondary | ICD-10-CM | POA: Diagnosis not present

## 2019-10-09 DIAGNOSIS — I5030 Unspecified diastolic (congestive) heart failure: Secondary | ICD-10-CM | POA: Diagnosis not present

## 2019-10-09 DIAGNOSIS — E1122 Type 2 diabetes mellitus with diabetic chronic kidney disease: Secondary | ICD-10-CM | POA: Diagnosis not present

## 2019-10-09 DIAGNOSIS — G8929 Other chronic pain: Secondary | ICD-10-CM | POA: Diagnosis not present

## 2019-10-09 DIAGNOSIS — I130 Hypertensive heart and chronic kidney disease with heart failure and stage 1 through stage 4 chronic kidney disease, or unspecified chronic kidney disease: Secondary | ICD-10-CM | POA: Diagnosis not present

## 2019-10-09 DIAGNOSIS — D631 Anemia in chronic kidney disease: Secondary | ICD-10-CM | POA: Diagnosis not present

## 2019-10-09 NOTE — Progress Notes (Signed)
Internal Medicine Clinic Attending  Case discussed with Dr. Chundi at the time of the visit.  We reviewed the resident's history and exam and pertinent patient test results.  I agree with the assessment, diagnosis, and plan of care documented in the resident's note.  Tequilla Cousineau, M.D., Ph.D.  

## 2019-10-09 NOTE — Addendum Note (Signed)
Addended by: Lars Mage on: 10/09/2019 03:59 PM   Modules accepted: Orders

## 2019-10-10 ENCOUNTER — Other Ambulatory Visit (HOSPITAL_COMMUNITY): Payer: Medicare HMO

## 2019-10-10 ENCOUNTER — Other Ambulatory Visit: Payer: Self-pay | Admitting: Internal Medicine

## 2019-10-10 ENCOUNTER — Other Ambulatory Visit: Payer: Self-pay

## 2019-10-10 ENCOUNTER — Encounter (HOSPITAL_COMMUNITY): Payer: Self-pay | Admitting: Orthopedic Surgery

## 2019-10-10 DIAGNOSIS — I129 Hypertensive chronic kidney disease with stage 1 through stage 4 chronic kidney disease, or unspecified chronic kidney disease: Secondary | ICD-10-CM | POA: Diagnosis not present

## 2019-10-10 DIAGNOSIS — N1832 Chronic kidney disease, stage 3b: Secondary | ICD-10-CM | POA: Diagnosis not present

## 2019-10-10 MED ORDER — DEXTROSE 5 % IV SOLN
3.0000 g | INTRAVENOUS | Status: AC
  Administered 2019-10-11: 3 g via INTRAVENOUS
  Filled 2019-10-10: qty 3000
  Filled 2019-10-10: qty 3

## 2019-10-10 MED ORDER — CLINDAMYCIN PHOSPHATE 900 MG/50ML IV SOLN
900.0000 mg | INTRAVENOUS | Status: DC
  Filled 2019-10-10: qty 50

## 2019-10-10 NOTE — Progress Notes (Signed)
Spoke with pt for pre-op call. Pt has been seen here within the last month. She states nothing has changed with her medical and surgical history. She states she has started back on her HTN medications. Pt is a type Diabetic. Last A1C was 8.5 on 09/04/19. Pt states she does not have a meter to check her blood sugar. Instructed pt not to take Victoza in the AM.  Pt unable to get a Covid test done today. She will arrive 3 hours prior to surgery start time. Pt to arrive at 7:00 AM.   ERAS protocol ordered. Instructed pt not to eat food after midnight tonight. But may have clear liquids until 7:00 AM. Gave pt the list of clear liquids. Instructed pt to drink 10 ounces of water just prior to 7:00 AM. She voiced understanding.

## 2019-10-10 NOTE — Progress Notes (Signed)
Call [name] [name] and left a message asking her to call the pharmacy and then call me and we would review history and I would give her instructions. Patient did not call. I called patient again and left a message.  I called patient's emergency contacts- there was no answer, I did not leave a message.

## 2019-10-11 ENCOUNTER — Other Ambulatory Visit: Payer: Self-pay

## 2019-10-11 ENCOUNTER — Inpatient Hospital Stay (HOSPITAL_COMMUNITY): Payer: Medicare HMO | Admitting: Anesthesiology

## 2019-10-11 ENCOUNTER — Encounter (HOSPITAL_COMMUNITY): Payer: Self-pay | Admitting: Orthopedic Surgery

## 2019-10-11 ENCOUNTER — Encounter (HOSPITAL_COMMUNITY)
Admission: RE | Disposition: A | Discharge status: Skilled Nursing Facility | Payer: Self-pay | Source: Home / Self Care | Attending: Orthopedic Surgery

## 2019-10-11 ENCOUNTER — Inpatient Hospital Stay (HOSPITAL_COMMUNITY)
Admission: RE | Admit: 2019-10-11 | Discharge: 2019-10-18 | DRG: 475 | Disposition: A | Discharge status: Skilled Nursing Facility | Payer: Medicare HMO | Attending: Orthopedic Surgery | Admitting: Orthopedic Surgery

## 2019-10-11 DIAGNOSIS — F319 Bipolar disorder, unspecified: Secondary | ICD-10-CM | POA: Diagnosis not present

## 2019-10-11 DIAGNOSIS — N183 Chronic kidney disease, stage 3 unspecified: Secondary | ICD-10-CM | POA: Diagnosis present

## 2019-10-11 DIAGNOSIS — M797 Fibromyalgia: Secondary | ICD-10-CM | POA: Diagnosis not present

## 2019-10-11 DIAGNOSIS — E1122 Type 2 diabetes mellitus with diabetic chronic kidney disease: Secondary | ICD-10-CM | POA: Diagnosis present

## 2019-10-11 DIAGNOSIS — T8781 Dehiscence of amputation stump: Principal | ICD-10-CM

## 2019-10-11 DIAGNOSIS — J449 Chronic obstructive pulmonary disease, unspecified: Secondary | ICD-10-CM | POA: Diagnosis present

## 2019-10-11 DIAGNOSIS — E1169 Type 2 diabetes mellitus with other specified complication: Secondary | ICD-10-CM | POA: Diagnosis present

## 2019-10-11 DIAGNOSIS — R5381 Other malaise: Secondary | ICD-10-CM | POA: Diagnosis not present

## 2019-10-11 DIAGNOSIS — E119 Type 2 diabetes mellitus without complications: Secondary | ICD-10-CM | POA: Diagnosis not present

## 2019-10-11 DIAGNOSIS — G629 Polyneuropathy, unspecified: Secondary | ICD-10-CM | POA: Diagnosis not present

## 2019-10-11 DIAGNOSIS — E785 Hyperlipidemia, unspecified: Secondary | ICD-10-CM | POA: Diagnosis not present

## 2019-10-11 DIAGNOSIS — I129 Hypertensive chronic kidney disease with stage 1 through stage 4 chronic kidney disease, or unspecified chronic kidney disease: Secondary | ICD-10-CM | POA: Diagnosis present

## 2019-10-11 DIAGNOSIS — F418 Other specified anxiety disorders: Secondary | ICD-10-CM | POA: Diagnosis present

## 2019-10-11 DIAGNOSIS — I70248 Atherosclerosis of native arteries of left leg with ulceration of other part of lower left leg: Secondary | ICD-10-CM | POA: Diagnosis not present

## 2019-10-11 DIAGNOSIS — I10 Essential (primary) hypertension: Secondary | ICD-10-CM | POA: Diagnosis not present

## 2019-10-11 DIAGNOSIS — G4733 Obstructive sleep apnea (adult) (pediatric): Secondary | ICD-10-CM | POA: Diagnosis not present

## 2019-10-11 DIAGNOSIS — D631 Anemia in chronic kidney disease: Secondary | ICD-10-CM | POA: Diagnosis present

## 2019-10-11 DIAGNOSIS — Z841 Family history of disorders of kidney and ureter: Secondary | ICD-10-CM

## 2019-10-11 DIAGNOSIS — T8130XA Disruption of wound, unspecified, initial encounter: Secondary | ICD-10-CM | POA: Diagnosis present

## 2019-10-11 DIAGNOSIS — Z6841 Body Mass Index (BMI) 40.0 and over, adult: Secondary | ICD-10-CM

## 2019-10-11 DIAGNOSIS — Z8249 Family history of ischemic heart disease and other diseases of the circulatory system: Secondary | ICD-10-CM | POA: Diagnosis not present

## 2019-10-11 DIAGNOSIS — Z7401 Bed confinement status: Secondary | ICD-10-CM | POA: Diagnosis not present

## 2019-10-11 DIAGNOSIS — L089 Local infection of the skin and subcutaneous tissue, unspecified: Secondary | ICD-10-CM | POA: Diagnosis not present

## 2019-10-11 DIAGNOSIS — Z87891 Personal history of nicotine dependence: Secondary | ICD-10-CM

## 2019-10-11 DIAGNOSIS — D649 Anemia, unspecified: Secondary | ICD-10-CM | POA: Diagnosis not present

## 2019-10-11 DIAGNOSIS — J45909 Unspecified asthma, uncomplicated: Secondary | ICD-10-CM | POA: Diagnosis present

## 2019-10-11 DIAGNOSIS — I130 Hypertensive heart and chronic kidney disease with heart failure and stage 1 through stage 4 chronic kidney disease, or unspecified chronic kidney disease: Secondary | ICD-10-CM | POA: Diagnosis not present

## 2019-10-11 DIAGNOSIS — K219 Gastro-esophageal reflux disease without esophagitis: Secondary | ICD-10-CM | POA: Diagnosis not present

## 2019-10-11 DIAGNOSIS — M2550 Pain in unspecified joint: Secondary | ICD-10-CM | POA: Diagnosis not present

## 2019-10-11 DIAGNOSIS — E1142 Type 2 diabetes mellitus with diabetic polyneuropathy: Secondary | ICD-10-CM | POA: Diagnosis present

## 2019-10-11 DIAGNOSIS — Z833 Family history of diabetes mellitus: Secondary | ICD-10-CM | POA: Diagnosis not present

## 2019-10-11 DIAGNOSIS — I509 Heart failure, unspecified: Secondary | ICD-10-CM | POA: Diagnosis not present

## 2019-10-11 DIAGNOSIS — E039 Hypothyroidism, unspecified: Secondary | ICD-10-CM | POA: Diagnosis present

## 2019-10-11 DIAGNOSIS — E43 Unspecified severe protein-calorie malnutrition: Secondary | ICD-10-CM | POA: Diagnosis not present

## 2019-10-11 DIAGNOSIS — Z20822 Contact with and (suspected) exposure to covid-19: Secondary | ICD-10-CM | POA: Diagnosis not present

## 2019-10-11 DIAGNOSIS — Y835 Amputation of limb(s) as the cause of abnormal reaction of the patient, or of later complication, without mention of misadventure at the time of the procedure: Secondary | ICD-10-CM | POA: Diagnosis present

## 2019-10-11 DIAGNOSIS — Z79899 Other long term (current) drug therapy: Secondary | ICD-10-CM

## 2019-10-11 DIAGNOSIS — N189 Chronic kidney disease, unspecified: Secondary | ICD-10-CM | POA: Diagnosis not present

## 2019-10-11 DIAGNOSIS — Z89612 Acquired absence of left leg above knee: Secondary | ICD-10-CM | POA: Diagnosis not present

## 2019-10-11 HISTORY — PX: ABOVE KNEE LEG AMPUTATION: SUR20

## 2019-10-11 HISTORY — PX: AMPUTATION: SHX166

## 2019-10-11 LAB — GLUCOSE, CAPILLARY
Glucose-Capillary: 169 mg/dL — ABNORMAL HIGH (ref 70–99)
Glucose-Capillary: 125 mg/dL — ABNORMAL HIGH (ref 70–99)
Glucose-Capillary: 119 mg/dL — ABNORMAL HIGH (ref 70–99)
Glucose-Capillary: 162 mg/dL — ABNORMAL HIGH (ref 70–99)

## 2019-10-11 LAB — CBC
HCT: 33 % — ABNORMAL LOW (ref 36.0–46.0)
Hemoglobin: 10.2 g/dL — ABNORMAL LOW (ref 12.0–15.0)
MCH: 25.9 pg — ABNORMAL LOW (ref 26.0–34.0)
MCHC: 30.9 g/dL (ref 30.0–36.0)
MCV: 83.8 fL (ref 80.0–100.0)
Platelets: 358 10*3/uL (ref 150–400)
RBC: 3.94 MIL/uL (ref 3.87–5.11)
RDW: 13.9 % (ref 11.5–15.5)
WBC: 5.6 10*3/uL (ref 4.0–10.5)
nRBC: 0 % (ref 0.0–0.2)

## 2019-10-11 LAB — BASIC METABOLIC PANEL
Anion gap: 10 (ref 5–15)
BUN: 32 mg/dL — ABNORMAL HIGH (ref 6–20)
CO2: 21 mmol/L — ABNORMAL LOW (ref 22–32)
Calcium: 8.8 mg/dL — ABNORMAL LOW (ref 8.9–10.3)
Chloride: 104 mmol/L (ref 98–111)
Creatinine, Ser: 2.1 mg/dL — ABNORMAL HIGH (ref 0.44–1.00)
GFR calc Af Amer: 33 mL/min — ABNORMAL LOW (ref >60)
GFR calc non Af Amer: 28 mL/min — ABNORMAL LOW (ref >60)
Glucose, Bld: 148 mg/dL — ABNORMAL HIGH (ref 70–99)
Potassium: 4.8 mmol/L (ref 3.5–5.1)
Sodium: 135 mmol/L (ref 135–145)

## 2019-10-11 LAB — SARS CORONAVIRUS 2 BY RT PCR (HOSPITAL ORDER, PERFORMED IN ~~LOC~~ HOSPITAL LAB): SARS Coronavirus 2: NEGATIVE

## 2019-10-11 LAB — POCT PREGNANCY, URINE: Preg Test, Ur: NEGATIVE

## 2019-10-11 SURGERY — AMPUTATION, ABOVE KNEE
Anesthesia: General | Site: Knee | Laterality: Left

## 2019-10-11 MED ORDER — HYDROMORPHONE HCL 1 MG/ML IJ SOLN
INTRAMUSCULAR | Status: DC | PRN
  Administered 2019-10-11: .5 mg via INTRAVENOUS

## 2019-10-11 MED ORDER — TERBINAFINE HCL 250 MG PO TABS: 250.0000 mg | ORAL_TABLET | Freq: Every day | ORAL | Status: DC

## 2019-10-11 MED ORDER — ACETAMINOPHEN 325 MG PO TABS
325.0000 mg | ORAL_TABLET | Freq: Four times a day (QID) | ORAL | Status: DC | PRN
  Filled 2019-10-11: qty 2

## 2019-10-11 MED ORDER — ORAL CARE MOUTH RINSE: 15.0000 mL | Freq: Once | OROMUCOSAL | Status: AC

## 2019-10-11 MED ORDER — METOCLOPRAMIDE HCL 5 MG PO TABS: 5.0000 mg | ORAL_TABLET | Freq: Three times a day (TID) | ORAL | Status: DC | PRN

## 2019-10-11 MED ORDER — HYDROMORPHONE HCL 1 MG/ML IJ SOLN
INTRAMUSCULAR | Status: AC
  Administered 2019-10-11: 0.5 mg via INTRAVENOUS
  Filled 2019-10-11: qty 2

## 2019-10-11 MED ORDER — ONDANSETRON HCL 4 MG/2ML IJ SOLN
INTRAMUSCULAR | Status: DC | PRN
  Administered 2019-10-11: 4 mg via INTRAVENOUS

## 2019-10-11 MED ORDER — ACETAMINOPHEN 10 MG/ML IV SOLN
INTRAVENOUS | Status: AC
  Administered 2019-10-11: 1000 mg via INTRAVENOUS
  Filled 2019-10-11: qty 100

## 2019-10-11 MED ORDER — OXYCODONE HCL 5 MG PO TABS
5.0000 mg | ORAL_TABLET | Freq: Once | ORAL | Status: AC | PRN
  Administered 2019-10-11: 5 mg via ORAL

## 2019-10-11 MED ORDER — ONDANSETRON HCL 4 MG/2ML IJ SOLN
INTRAMUSCULAR | Status: AC
  Filled 2019-10-11: qty 2

## 2019-10-11 MED ORDER — FLUOXETINE HCL 20 MG PO CAPS
60.0000 mg | ORAL_CAPSULE | Freq: Every day | ORAL | Status: DC
  Administered 2019-10-12 – 2019-10-18 (×7): 60 mg via ORAL
  Filled 2019-10-11 (×7): qty 3

## 2019-10-11 MED ORDER — METOCLOPRAMIDE HCL 5 MG/ML IJ SOLN: 5.0000 mg | Freq: Three times a day (TID) | INTRAMUSCULAR | Status: DC | PRN

## 2019-10-11 MED ORDER — METOPROLOL SUCCINATE ER 25 MG PO TB24
12.5000 mg | ORAL_TABLET | Freq: Every day | ORAL | Status: DC
  Administered 2019-10-12 – 2019-10-18 (×7): 12.5 mg via ORAL
  Filled 2019-10-11 (×7): qty 1

## 2019-10-11 MED ORDER — FLUOXETINE HCL 20 MG/5ML PO SOLN: 60.0000 mg | Freq: Every day | ORAL | Status: DC

## 2019-10-11 MED ORDER — AMITRIPTYLINE HCL 50 MG PO TABS
50.0000 mg | ORAL_TABLET | Freq: Every day | ORAL | Status: DC
  Administered 2019-10-11 – 2019-10-17 (×7): 50 mg via ORAL
  Filled 2019-10-11 (×7): qty 1

## 2019-10-11 MED ORDER — INSULIN ASPART 100 UNIT/ML ~~LOC~~ SOLN
0.0000 [IU] | Freq: Three times a day (TID) | SUBCUTANEOUS | Status: DC
  Administered 2019-10-12: 3 [IU] via SUBCUTANEOUS
  Administered 2019-10-12: 2 [IU] via SUBCUTANEOUS
  Administered 2019-10-12: 3 [IU] via SUBCUTANEOUS
  Administered 2019-10-13 (×2): 2 [IU] via SUBCUTANEOUS
  Administered 2019-10-13 – 2019-10-14 (×2): 3 [IU] via SUBCUTANEOUS
  Administered 2019-10-14: 2 [IU] via SUBCUTANEOUS
  Administered 2019-10-14: 5 [IU] via SUBCUTANEOUS
  Administered 2019-10-15: 2 [IU] via SUBCUTANEOUS
  Administered 2019-10-15: 5 [IU] via SUBCUTANEOUS
  Administered 2019-10-15: 2 [IU] via SUBCUTANEOUS
  Administered 2019-10-16: 3 [IU] via SUBCUTANEOUS
  Administered 2019-10-16 – 2019-10-17 (×2): 2 [IU] via SUBCUTANEOUS
  Administered 2019-10-17: 3 [IU] via SUBCUTANEOUS
  Administered 2019-10-18: 2 [IU] via SUBCUTANEOUS

## 2019-10-11 MED ORDER — OXYCODONE HCL 5 MG PO TABS
5.0000 mg | ORAL_TABLET | ORAL | Status: DC | PRN
  Administered 2019-10-11 (×2): 10 mg via ORAL
  Administered 2019-10-12: 5 mg via ORAL
  Administered 2019-10-12 – 2019-10-13 (×3): 10 mg via ORAL
  Administered 2019-10-14: 5 mg via ORAL
  Administered 2019-10-14 – 2019-10-15 (×3): 10 mg via ORAL
  Administered 2019-10-15: 5 mg via ORAL
  Administered 2019-10-16 – 2019-10-17 (×4): 10 mg via ORAL
  Filled 2019-10-11 (×8): qty 2
  Filled 2019-10-11: qty 1
  Filled 2019-10-11 (×7): qty 2

## 2019-10-11 MED ORDER — SODIUM CHLORIDE 0.9 % IV SOLN: INTRAVENOUS | Status: DC

## 2019-10-11 MED ORDER — HYDROMORPHONE HCL 1 MG/ML IJ SOLN
INTRAMUSCULAR | Status: AC
  Filled 2019-10-11: qty 0.5

## 2019-10-11 MED ORDER — BUMETANIDE 1 MG PO TABS
1.0000 mg | ORAL_TABLET | Freq: Two times a day (BID) | ORAL | Status: DC
  Administered 2019-10-12 – 2019-10-18 (×14): 1 mg via ORAL
  Filled 2019-10-11 (×14): qty 1

## 2019-10-11 MED ORDER — METHOCARBAMOL 1000 MG/10ML IJ SOLN
500.0000 mg | Freq: Four times a day (QID) | INTRAVENOUS | Status: DC | PRN
  Filled 2019-10-11: qty 5

## 2019-10-11 MED ORDER — ONDANSETRON HCL 4 MG PO TABS: 4.0000 mg | ORAL_TABLET | Freq: Four times a day (QID) | ORAL | Status: DC | PRN

## 2019-10-11 MED ORDER — MIDAZOLAM HCL 5 MG/5ML IJ SOLN
INTRAMUSCULAR | Status: DC | PRN
  Administered 2019-10-11: 2 mg via INTRAVENOUS

## 2019-10-11 MED ORDER — MIDAZOLAM HCL 2 MG/2ML IJ SOLN
INTRAMUSCULAR | Status: AC
  Filled 2019-10-11: qty 2

## 2019-10-11 MED ORDER — AMLODIPINE BESYLATE 10 MG PO TABS
10.0000 mg | ORAL_TABLET | Freq: Every day | ORAL | Status: DC
  Administered 2019-10-12 – 2019-10-18 (×7): 10 mg via ORAL
  Filled 2019-10-11 (×7): qty 1

## 2019-10-11 MED ORDER — LISINOPRIL 10 MG PO TABS
10.0000 mg | ORAL_TABLET | Freq: Every day | ORAL | Status: DC
  Administered 2019-10-12 – 2019-10-18 (×7): 10 mg via ORAL
  Filled 2019-10-11 (×7): qty 1

## 2019-10-11 MED ORDER — LIRAGLUTIDE 18 MG/3ML ~~LOC~~ SOPN: 1.8000 mg | PEN_INJECTOR | Freq: Every day | SUBCUTANEOUS | Status: DC

## 2019-10-11 MED ORDER — ATORVASTATIN CALCIUM 10 MG PO TABS
20.0000 mg | ORAL_TABLET | Freq: Every day | ORAL | Status: DC
  Administered 2019-10-12 – 2019-10-18 (×7): 20 mg via ORAL
  Filled 2019-10-11 (×7): qty 2

## 2019-10-11 MED ORDER — SODIUM ZIRCONIUM CYCLOSILICATE 10 G PO PACK
10.0000 g | PACK | Freq: Two times a day (BID) | ORAL | Status: DC
  Administered 2019-10-12 – 2019-10-18 (×13): 10 g via ORAL
  Filled 2019-10-11 (×13): qty 1

## 2019-10-11 MED ORDER — PREGABALIN 50 MG PO CAPS
50.0000 mg | ORAL_CAPSULE | Freq: Two times a day (BID) | ORAL | Status: DC
  Administered 2019-10-11: 50 mg via ORAL
  Filled 2019-10-11: qty 1

## 2019-10-11 MED ORDER — METHOCARBAMOL 500 MG PO TABS
ORAL_TABLET | ORAL | Status: AC
  Filled 2019-10-11: qty 1

## 2019-10-11 MED ORDER — ONDANSETRON HCL 4 MG/2ML IJ SOLN
4.0000 mg | Freq: Once | INTRAMUSCULAR | Status: AC | PRN
  Administered 2019-10-11: 4 mg via INTRAVENOUS

## 2019-10-11 MED ORDER — DEXTROSE 5 % IV SOLN: 3.0000 g | INTRAVENOUS | Status: DC

## 2019-10-11 MED ORDER — HYDROXYZINE HCL 25 MG PO TABS: 25.0000 mg | ORAL_TABLET | Freq: Four times a day (QID) | ORAL | Status: DC | PRN

## 2019-10-11 MED ORDER — PROPOFOL 10 MG/ML IV BOLUS
INTRAVENOUS | Status: DC | PRN
  Administered 2019-10-11: 180 mg via INTRAVENOUS

## 2019-10-11 MED ORDER — HYDROMORPHONE HCL 1 MG/ML IJ SOLN
0.2500 mg | INTRAMUSCULAR | Status: DC | PRN
  Administered 2019-10-11 (×3): 0.5 mg via INTRAVENOUS

## 2019-10-11 MED ORDER — DOCUSATE SODIUM 100 MG PO CAPS
100.0000 mg | ORAL_CAPSULE | Freq: Two times a day (BID) | ORAL | Status: DC
  Administered 2019-10-11 – 2019-10-18 (×14): 100 mg via ORAL
  Filled 2019-10-11 (×14): qty 1

## 2019-10-11 MED ORDER — SODIUM BICARBONATE 650 MG PO TABS
650.0000 mg | ORAL_TABLET | Freq: Two times a day (BID) | ORAL | Status: DC
  Administered 2019-10-11 – 2019-10-18 (×14): 650 mg via ORAL
  Filled 2019-10-11 (×14): qty 1

## 2019-10-11 MED ORDER — FENTANYL CITRATE (PF) 250 MCG/5ML IJ SOLN
INTRAMUSCULAR | Status: AC
  Filled 2019-10-11: qty 5

## 2019-10-11 MED ORDER — FENTANYL CITRATE (PF) 100 MCG/2ML IJ SOLN
INTRAMUSCULAR | Status: DC | PRN
  Administered 2019-10-11 (×5): 50 ug via INTRAVENOUS

## 2019-10-11 MED ORDER — HYDROMORPHONE HCL 1 MG/ML IJ SOLN
0.5000 mg | INTRAMUSCULAR | Status: DC | PRN
  Administered 2019-10-11 – 2019-10-16 (×12): 1 mg via INTRAVENOUS
  Filled 2019-10-11 (×14): qty 1

## 2019-10-11 MED ORDER — ACETAMINOPHEN 10 MG/ML IV SOLN: 1000.0000 mg | Freq: Once | INTRAVENOUS | Status: AC

## 2019-10-11 MED ORDER — METHOCARBAMOL 500 MG PO TABS
500.0000 mg | ORAL_TABLET | Freq: Four times a day (QID) | ORAL | Status: DC | PRN
  Administered 2019-10-11 – 2019-10-15 (×7): 500 mg via ORAL
  Filled 2019-10-11 (×6): qty 1

## 2019-10-11 MED ORDER — PHENYLEPHRINE HCL-NACL 10-0.9 MG/250ML-% IV SOLN
INTRAVENOUS | Status: DC | PRN
  Administered 2019-10-11: 50 ug/min via INTRAVENOUS

## 2019-10-11 MED ORDER — CEFAZOLIN SODIUM-DEXTROSE 2-4 GM/100ML-% IV SOLN
2.0000 g | Freq: Four times a day (QID) | INTRAVENOUS | Status: AC
  Administered 2019-10-12 (×3): 2 g via INTRAVENOUS
  Filled 2019-10-11 (×3): qty 100

## 2019-10-11 MED ORDER — OXYCODONE HCL 5 MG/5ML PO SOLN: 5.0000 mg | Freq: Once | ORAL | Status: AC | PRN

## 2019-10-11 MED ORDER — ALLOPURINOL 100 MG PO TABS
100.0000 mg | ORAL_TABLET | Freq: Two times a day (BID) | ORAL | Status: DC
  Administered 2019-10-12 – 2019-10-18 (×13): 100 mg via ORAL
  Filled 2019-10-11 (×14): qty 1

## 2019-10-11 MED ORDER — HYDROMORPHONE HCL 1 MG/ML IJ SOLN
0.2500 mg | INTRAMUSCULAR | Status: DC | PRN
  Administered 2019-10-11: 1 mg via INTRAVENOUS

## 2019-10-11 MED ORDER — CHLORHEXIDINE GLUCONATE 0.12 % MT SOLN
15.0000 mL | Freq: Once | OROMUCOSAL | Status: AC
  Administered 2019-10-11: 15 mL via OROMUCOSAL
  Filled 2019-10-11: qty 15

## 2019-10-11 MED ORDER — HYDROMORPHONE HCL 1 MG/ML IJ SOLN
INTRAMUSCULAR | Status: AC
  Filled 2019-10-11: qty 1

## 2019-10-11 MED ORDER — OXYCODONE HCL 5 MG PO TABS
ORAL_TABLET | ORAL | Status: AC
  Filled 2019-10-11: qty 1

## 2019-10-11 MED ORDER — LIDOCAINE 2% (20 MG/ML) 5 ML SYRINGE
INTRAMUSCULAR | Status: DC | PRN
  Administered 2019-10-11: 100 mg via INTRAVENOUS

## 2019-10-11 MED ORDER — ONDANSETRON HCL 4 MG/2ML IJ SOLN: 4.0000 mg | Freq: Four times a day (QID) | INTRAMUSCULAR | Status: DC | PRN

## 2019-10-11 MED ORDER — 0.9 % SODIUM CHLORIDE (POUR BTL) OPTIME
TOPICAL | Status: DC | PRN
  Administered 2019-10-11: 1000 mL

## 2019-10-11 SURGICAL SUPPLY — 43 items
BLADE SAW RECIP 87.9 MT (BLADE) ×3 IMPLANT
BLADE SURG 21 STRL SS (BLADE) ×3 IMPLANT
BNDG COHESIVE 6X5 TAN STRL LF (GAUZE/BANDAGES/DRESSINGS) ×3 IMPLANT
CANISTER WOUND CARE 500ML ATS (WOUND CARE) IMPLANT
COVER SURGICAL LIGHT HANDLE (MISCELLANEOUS) ×3 IMPLANT
COVER WAND RF STERILE (DRAPES) IMPLANT
CUFF TOURN SGL QUICK 34 (TOURNIQUET CUFF)
CUFF TRNQT CYL 34X4.125X (TOURNIQUET CUFF) IMPLANT
DRAPE INCISE IOBAN 66X45 STRL (DRAPES) ×6 IMPLANT
DRAPE U-SHAPE 47X51 STRL (DRAPES) ×3 IMPLANT
DRESSING PREVENA PLUS CUSTOM (GAUZE/BANDAGES/DRESSINGS) ×1 IMPLANT
DRSG PREVENA PLUS CUSTOM (GAUZE/BANDAGES/DRESSINGS) ×3
DURAPREP 26ML APPLICATOR (WOUND CARE) ×3 IMPLANT
ELECT REM PT RETURN 9FT ADLT (ELECTROSURGICAL) ×3
ELECTRODE REM PT RTRN 9FT ADLT (ELECTROSURGICAL) ×1 IMPLANT
GLOVE BIOGEL PI IND STRL 7.5 (GLOVE) ×1 IMPLANT
GLOVE BIOGEL PI IND STRL 9 (GLOVE) ×1 IMPLANT
GLOVE BIOGEL PI INDICATOR 7.5 (GLOVE) ×2
GLOVE BIOGEL PI INDICATOR 9 (GLOVE) ×2
GLOVE SURG ORTHO 9.0 STRL STRW (GLOVE) ×3 IMPLANT
GLOVE SURG SS PI 6.5 STRL IVOR (GLOVE) ×3 IMPLANT
GOWN STRL REUS W/ TWL LRG LVL3 (GOWN DISPOSABLE) ×1 IMPLANT
GOWN STRL REUS W/ TWL XL LVL3 (GOWN DISPOSABLE) ×2 IMPLANT
GOWN STRL REUS W/TWL LRG LVL3 (GOWN DISPOSABLE) ×3
GOWN STRL REUS W/TWL XL LVL3 (GOWN DISPOSABLE) ×6
KIT BASIN OR (CUSTOM PROCEDURE TRAY) ×3 IMPLANT
KIT TURNOVER KIT B (KITS) ×3 IMPLANT
MANIFOLD NEPTUNE II (INSTRUMENTS) ×3 IMPLANT
NS IRRIG 1000ML POUR BTL (IV SOLUTION) ×3 IMPLANT
PACK ORTHO EXTREMITY (CUSTOM PROCEDURE TRAY) ×3 IMPLANT
PAD ARMBOARD 7.5X6 YLW CONV (MISCELLANEOUS) ×3 IMPLANT
PREVENA RESTOR ARTHOFORM 46X30 (CANNISTER) ×3 IMPLANT
STAPLER VISISTAT 35W (STAPLE) IMPLANT
STOCKINETTE IMPERVIOUS LG (DRAPES) IMPLANT
SUT ETHILON 2 0 PSLX (SUTURE) ×6 IMPLANT
SUT SILK 2 0 (SUTURE) ×3
SUT SILK 2-0 18XBRD TIE 12 (SUTURE) ×1 IMPLANT
SUT VIC AB 1 CT1 27 (SUTURE) ×3
SUT VIC AB 1 CT1 27XBRD ANBCTR (SUTURE) IMPLANT
TOWEL GREEN STERILE FF (TOWEL DISPOSABLE) ×3 IMPLANT
TUBE CONNECTING 20'X1/4 (TUBING) ×1
TUBE CONNECTING 20X1/4 (TUBING) ×2 IMPLANT
YANKAUER SUCT BULB TIP NO VENT (SUCTIONS) ×3 IMPLANT

## 2019-10-11 NOTE — Progress Notes (Signed)
Patient is requesting to see a therapist while in patient. States she needs someone to talk to about her her mental status. Dr. Sharol Given notified.

## 2019-10-11 NOTE — Anesthesia Postprocedure Evaluation (Signed)
Anesthesia Post Note  Patient: [name] [name]  Procedure(s) Performed: LEFT ABOVE KNEE AMPUTATION (Left Knee)     Patient location during evaluation: PACU Anesthesia Type: General Level of consciousness: awake and alert Pain management: pain level controlled Vital Signs Assessment: post-procedure vital signs reviewed and stable Respiratory status: spontaneous breathing, nonlabored ventilation, respiratory function stable and patient connected to nasal cannula oxygen Cardiovascular status: blood pressure returned to baseline and stable Postop Assessment: no apparent nausea or vomiting Anesthetic complications: no    Last Vitals:  Vitals:   10/11/19 1315 10/11/19 1330  BP: (!) 157/85 134/72  Pulse: 83 81  Resp: 12 17  Temp:    SpO2: 99% 100%    Last Pain:  Vitals:   10/11/19 1230  TempSrc:   PainSc: Asleep                 Lidia Collum

## 2019-10-11 NOTE — H&P (Signed)
[name] [name] is an 44 y.o. female.   Chief Complaint: Left Wound dehiscence HPI:  The patient is a 44 year old woman seen today status post revision left below knee amputation.  Unfortunately this has dehisced again.  She has been packing the areas of dehiscence open with gauze.  Her sutures remain in place.  Past Medical History:  Diagnosis Date  . Abdominal muscle pain 09/08/2016  . Abnormal Pap smear of cervix 2009  . Abscess    history of multiple abscesses  . Acute bilateral low back pain 02/13/2017  . Acute blood loss anemia   . Acute renal failure ([place]) 07/12/2012  . AKI (acute kidney injury) ([place])   . Anemia of chronic disease 2002  . Anxiety    Panic attacks  . Bilateral lower extremity edema 05/13/2016  . Bipolar disorder ([place])   . Cellulitis 05/21/2014   right eye  . Chronic bronchitis ([place])    "get it q yr" (05/13/2013)  . Chronic pain   . Depression   . Edema of lower extremity   . Endocarditis 2002   subacute bacterial endocarditis.   . Family history of anesthesia complication    "my mom has a hard time coming out from under"  . Fibromyalgia   . GERD (gastroesophageal reflux disease)    occ  . Heart murmur   . Herpes simplex type 1 infection 01/16/2018  . History of blood transfusion    "just low blood count" (05/13/2013)  . Hyperlipidemia   . Hypertension   . Hypothyroidism   . Hypothyroidism, adult 03/21/2014  . Leukocytosis   . Necrosis ([place])    and ulceration  . Necrotizing fasciitis s/p OR debridements 07/06/2012  . Obesity   . OSA on CPAP    does not wear all the time  . Peripheral neuropathy   . Type II diabetes mellitus ([place])    Type  II    Past Surgical History:  Procedure Laterality Date  . ABDOMINAL AORTOGRAM W/LOWER EXTREMITY Left 03/19/2019   Procedure: ABDOMINAL AORTOGRAM W/LOWER EXTREMITY;  Surgeon: Serafina Mitchell, MD;  Location: [facility];  Service: Cardiovascular;  Laterality: Left;  . AMPUTATION Left 04/07/2017    Procedure: LEFT BELOW KNEE AMPUTATION;  Surgeon: Newt Minion, MD;  Location: [facility];  Service: Orthopedics;  Laterality: Left;  . EYE SURGERY     lazer  . INCISION AND DRAINAGE ABSCESS     multiple I&Ds  . INCISION AND DRAINAGE ABSCESS Left 07/09/2012   Procedure: DRESSING CHANGE, THIGH WOUND;  Surgeon: Harl Bowie, MD;  Location: [facility];  Service: General;  Laterality: Left;  . INCISION AND DRAINAGE OF WOUND Left 07/07/2012   Procedure: IRRIGATION AND DEBRIDEMENT WOUND;  Surgeon: Harl Bowie, MD;  Location: [facility];  Service: General;  Laterality: Left;  . INCISION AND DRAINAGE PERIRECTAL ABSCESS Left 07/14/2012   Procedure: DEBRIDEMENT OF SKIN & SOFT TISSUE; DRESSING CHANGE UNDER ANESTHESIA;  Surgeon: Gayland Curry, MD,FACS;  Location: [facility];  Service: General;  Laterality: Left;  . INCISION AND DRAINAGE PERIRECTAL ABSCESS Left 07/16/2012   Procedure: I&D Left Thigh;  Surgeon: Gwenyth Ober, MD;  Location: [facility];  Service: General;  Laterality: Left;  . INCISION AND DRAINAGE PERIRECTAL ABSCESS N/A 01/05/2015   Procedure: IRRIGATION AND DEBRIDEMENT PERIRECTAL ABSCESS;  Surgeon: Donnie Mesa, MD;  Location: [facility];  Service: General;  Laterality: N/A;  . IRRIGATION AND DEBRIDEMENT ABSCESS Left 07/06/2012   Procedure: IRRIGATION AND DEBRIDEMENT  ABSCESS BUTTOCKS AND THIGH;  Surgeon: Shann Medal, MD;  Location: [facility];  Service: General;  Laterality: Left;  . IRRIGATION AND DEBRIDEMENT ABSCESS Left 08/10/2012   Procedure: IRRIGATION AND DEBRIDEMENT ABSCESS;  Surgeon: Madilyn Hook, DO;  Location: [facility];  Service: General;  Laterality: Left;  . STUMP REVISION Left 06/09/2017   Procedure: REVISION LEFT BELOW KNEE AMPUTATION;  Surgeon: Newt Minion, MD;  Location: [facility];  Service: Orthopedics;  Laterality: Left;  . STUMP REVISION Left 09/04/2019   Procedure: LEFT BELOW KNEE AMPUTATION REVISION;  Surgeon: Newt Minion, MD;  Location: [facility];  Service: Orthopedics;  Laterality: Left;  .  STUMP REVISION Left 09/20/2019   Procedure: LEFT BELOW KNEE AMPUTATION REVISION;  Surgeon: Newt Minion, MD;  Location: [facility];  Service: Orthopedics;  Laterality: Left;  . TONSILLECTOMY  1994    Family History  Problem Relation Age of Onset  . Heart failure Mother   . Diabetes Mother   . Kidney disease Mother   . Kidney disease Father   . Diabetes Father   . Diabetes Paternal Grandmother   . Heart failure Paternal Grandmother   . Other Other    Social History:  reports that she has quit smoking. Her smoking use included cigarettes. She has a 8.00 pack-year smoking history. She quit smokeless tobacco use about 3 weeks ago. She reports current alcohol use. She reports that she does not use drugs.  Allergies:  Allergies  Allergen Reactions  . Vancomycin Other (See Comments)    Acute renal failure suspected secondary to vanco    No medications prior to admission.    No results found for this or any previous visit (from the past 48 hour(s)). No results found.  Review of Systems  All other systems reviewed and are negative.   There were no vitals taken for this visit. Physical Exam  On examination of the left below knee amputation there is significant dehiscence. Sutures are in place. Laterally incision is gaped open a length of 5 cm, 3 cm wide with 2 cm of depth. Blood clot in wound base. Medially has gaped 5 cm as well this is 3 cm deep. Clear brown drainage. Inadequate soft tissue for further revision surgery below knee.  Lungs Clear Heart RRR Assessment/Plan 1. Dehiscence of amputation stump ([place])   2. Status post below knee amputation, left ([place])     Plan: wounds packed open with gauze. Dr Sharol Given has seen and is in agreement with plan. Will plan for above knee amputation. Patient in agreement with plan. Plan for this, this week.    [name], [place] 10/11/2019, 6:42 AM

## 2019-10-11 NOTE — Anesthesia Procedure Notes (Signed)
Procedure Name: LMA Insertion Date/Time: 10/11/2019 10:21 AM Performed by: Amadeo Garnet, CRNA Pre-anesthesia Checklist: Patient identified, Emergency Drugs available, Suction available and Patient being monitored Patient Re-evaluated:Patient Re-evaluated prior to induction Oxygen Delivery Method: Circle system utilized Preoxygenation: Pre-oxygenation with 100% oxygen Induction Type: IV induction Ventilation: Mask ventilation without difficulty LMA: LMA inserted LMA Size: 5.0 Number of attempts: 1 Placement Confirmation: positive ETCO2 and breath sounds checked- equal and bilateral Tube secured with: Tape Dental Injury: Teeth and Oropharynx as per pre-operative assessment

## 2019-10-11 NOTE — Anesthesia Preprocedure Evaluation (Addendum)
Anesthesia Evaluation  Patient identified by MRN, date of birth, ID band Patient awake    Reviewed: Allergy & Precautions, NPO status , Patient's Chart, lab work & pertinent test results, reviewed documented beta blocker date and time   History of Anesthesia Complications Negative for: history of anesthetic complications  Airway Mallampati: III  TM Distance: >3 FB Neck ROM: Full    Dental  (+) Dental Advisory Given   Pulmonary asthma (mild) , sleep apnea and Continuous Positive Airway Pressure Ventilation , Current Smoker and Patient abstained from smoking., former smoker,    Pulmonary exam normal        Cardiovascular hypertension, Pt. on home beta blockers and Pt. on medications Normal cardiovascular exam     Neuro/Psych PSYCHIATRIC DISORDERS Anxiety Depression Bipolar Disorder negative neurological ROS     GI/Hepatic Neg liver ROS, GERD  ,  Endo/Other  diabetes, Type 2Hypothyroidism (no longer on meds) Morbid obesity Hyponatremia Hyperkalemia Hypocalcemia   Renal/GU CRFRenal disease (Cr 2.02, K 5.5)     Musculoskeletal  (+) Fibromyalgia -  Abdominal (+) + obese,   Peds  Hematology  (+) anemia , Hgb 8.2   Anesthesia Other Findings   Reproductive/Obstetrics                             Anesthesia Physical  Anesthesia Plan  ASA: III  Anesthesia Plan: General   Post-op Pain Management:    Induction: Intravenous  PONV Risk Score and Plan: 3 and Treatment may vary due to age or medical condition, Ondansetron, Dexamethasone and Midazolam  Airway Management Planned: LMA  Additional Equipment: None  Intra-op Plan:   Post-operative Plan: Extubation in OR  Informed Consent: I have reviewed the patients History and Physical, chart, labs and discussed the procedure including the risks, benefits and alternatives for the proposed anesthesia with the patient or authorized  representative who has indicated his/her understanding and acceptance.     Dental advisory given  Plan Discussed with: CRNA and Anesthesiologist  Anesthesia Plan Comments:         Anesthesia Quick Evaluation

## 2019-10-11 NOTE — Op Note (Signed)
10/11/2019  11:12 AM  PATIENT:  [name] [name]    PRE-OPERATIVE DIAGNOSIS:  Dehiscence Left Below Knee Amputation  POST-OPERATIVE DIAGNOSIS:  Same  PROCEDURE:  LEFT ABOVE KNEE AMPUTATION Application of Prevena Ortho form and customizable wound VAC sponge  SURGEON:  Newt Minion, MD  PHYSICIAN ASSISTANT:None ANESTHESIA:   General  PREOPERATIVE INDICATIONS:  [name] [name] is a  44 y.o. female with a diagnosis of Dehiscence Left Below Knee Amputation who failed conservative measures and elected for surgical management.    The risks benefits and alternatives were discussed with the patient preoperatively including but not limited to the risks of infection, bleeding, nerve injury, cardiopulmonary complications, the need for revision surgery, among others, and the patient was willing to proceed.  OPERATIVE IMPLANTS: Praveena wound VAC sponges x2  @ENCIMAGES @  OPERATIVE FINDINGS: Patient had revision to a short below the knee amputation on the left. She had progressive wound dehiscence and necrosis of the muscles. Patient did not have sufficient bone or soft tissue to proceed with a revision of the below-knee amputation and she presents at this time for above-the-knee amputation. The wound edges were healthy viable with no evidence of infection.  OPERATIVE PROCEDURE: Patient was brought the operating room and underwent a general anesthetic. After adequate levels anesthesia were obtained patient's left lower extremity was prepped using DuraPrep draped into a sterile field a timeout was called. The necrotic tissue of the below-knee amputation was wrapped out of sterile field with impervious stockinette and the surgical field was not exposed to the wound dehiscence. A fishmouth incision was made just proximal to the patella this was carried down through the patella quad tendon. The bone was incised with a reciprocating saw 1 cm proximal to the skin incision. The vascular bundle was  clamped and suture ligated with 2-0 silk. The sciatic nerve was pulled cut and allowed to retract. After completion amputation electrocautery was used to finalize hemostasis. The deep and superficial fascia layers were closed using #1 Vicryl the skin was closed using 2-0 nylon and staples. The customizable wound VAC sponge was applied this was covered with a Arnell Sieving form wound VAC sponge this had a good suction fit patient was extubated taken to PACU in stable condition   DISCHARGE PLANNING:  Antibiotic duration: Continue antibiotics for 24 hours  Weightbearing: Nonweightbearing on the left  Pain medication: Opioid pathway  Dressing care/ Wound VAC: Wound VAC for 1 week at discharge  Ambulatory devices: Walker  Discharge to: Anticipate discharge to skilled nursing  Follow-up: In the office 1 week post operative.

## 2019-10-11 NOTE — Progress Notes (Signed)
The chaplain visited with the patient after a patient request. The patient spoke with the chaplain about the difficulties of losing their mother three years ago and how that has been difficult while dealing with an above knee amputation. The patient also expressed a variety of broken and difficult relationships. The chaplain will follow-up with on Monday if available.  Brion Aliment Chaplain Resident For questions concerning this note please contact me by pager [phone]

## 2019-10-11 NOTE — Transfer of Care (Signed)
Immediate Anesthesia Transfer of Care Note  Patient: [name] [name]  Procedure(s) Performed: LEFT ABOVE KNEE AMPUTATION (Left Knee)  Patient Location: PACU  Anesthesia Type:General  Level of Consciousness: awake, alert  and oriented  Airway & Oxygen Therapy: Patient Spontanous Breathing and Patient connected to face mask oxygen  Post-op Assessment: Report given to RN, Post -op Vital signs reviewed and stable and Patient moving all extremities  Post vital signs: Reviewed and stable  Last Vitals:  Vitals Value Taken Time  BP 166/110 10/11/19 1149  Temp 36.7 C 10/11/19 1115  Pulse 88 10/11/19 1153  Resp 19 10/11/19 1153  SpO2 100 % 10/11/19 1153  Vitals shown include unvalidated device data.  Last Pain:  Vitals:   10/11/19 1145  TempSrc:   PainSc: 10-Worst pain ever      Patients Stated Pain Goal: 3 (60/10/93 2355)  Complications: No apparent anesthesia complications

## 2019-10-11 NOTE — Progress Notes (Signed)
Spoke with dr Noreene Larsson re: ongomg pain/ new orders rec'd

## 2019-10-11 NOTE — Progress Notes (Signed)
Received patient from PACU. Patient very drowsy but easily arousable. Wound vac to left AKA clean, dry, and intact. Patient resting comfortably in bed, will continue to monitor.

## 2019-10-12 LAB — GLUCOSE, CAPILLARY
Glucose-Capillary: 177 mg/dL — ABNORMAL HIGH (ref 70–99)
Glucose-Capillary: 145 mg/dL — ABNORMAL HIGH (ref 70–99)
Glucose-Capillary: 158 mg/dL — ABNORMAL HIGH (ref 70–99)
Glucose-Capillary: 128 mg/dL — ABNORMAL HIGH (ref 70–99)

## 2019-10-12 MED ORDER — PREGABALIN 100 MG PO CAPS
100.0000 mg | ORAL_CAPSULE | Freq: Three times a day (TID) | ORAL | Status: DC
  Administered 2019-10-12 – 2019-10-18 (×19): 100 mg via ORAL
  Filled 2019-10-12 (×20): qty 1

## 2019-10-12 MED ORDER — LIRAGLUTIDE 18 MG/3ML ~~LOC~~ SOPN
1.8000 mg | PEN_INJECTOR | Freq: Every day | SUBCUTANEOUS | Status: DC
  Administered 2019-10-12 – 2019-10-18 (×7): 1.8 mg via SUBCUTANEOUS
  Filled 2019-10-12 (×5): qty 3

## 2019-10-12 NOTE — Progress Notes (Deleted)
Physical Therapy Treatment Patient Details Name: [name] [name] MRN: [id number] DOB: 1975/12/13 Today's Date: 10/12/2019    History of Present Illness Patient with left below knee amputation that unfortunately dehisced again. Pt is now s/p LLE knee amputation. Significant PMH of DM2, obesity, and bipolar disorder.     PT Comments    Pt evaluated s/p L AKA. Prior to admission, she is modI, and lives with her grandmother, 43 y.o. daughter, and uncle in a house with 2 steps to enter. She states she sleeps on an air mattress. On PT evaluation, pt requiring min guard assist for low pivot transfer from bed to chair. She is interested in SNF to return to modI level following this surgery. I think this is appropriate in light of deficits (listed below) and decreased caregiver support. Initiated education regarding limb positioning and residual limb exercises.    Follow Up Recommendations  Supervision for mobility/OOB;SNF     Equipment Recommendations  Hospital bed (will need to follow up with pt on this; she reports she isn't sure if her grandmother will want one in her home)   Recommendations for Other Services       Precautions / Restrictions Precautions Precautions: Fall Precaution Comments: LLE wound vac Restrictions Weight Bearing Restrictions: Yes LLE Weight Bearing: Non weight bearing    Mobility  Bed Mobility Overal bed mobility: Modified Independent             General bed mobility comments: HOB up and use of rail  Transfers Overall transfer level: Needs assistance Equipment used: None Transfers: Squat Pivot Transfers     Squat pivot transfers: Min guard     General transfer comment: bed>recliner going to her right side; she was slow and cautious with transfer  Ambulation/Gait                 Stairs             Wheelchair Mobility    Modified Rankin (Stroke Patients Only)       Balance Overall balance assessment: Needs  assistance Sitting-balance support: No upper extremity supported Sitting balance-Leahy Scale: Good     Standing balance support: Bilateral upper extremity supported Standing balance-Leahy Scale: Poor Standing balance comment: reliant on UE support                            Cognition Arousal/Alertness: Awake/alert Behavior During Therapy: WFL for tasks assessed/performed Overall Cognitive Status: Within Functional Limits for tasks assessed                                        Exercises Other Exercises Other Exercises: Education provided on exercises including supine hip flexion, sidelying hip abduction, and glute squeezes.  Other Exercises: Education provided on prone positioning    General Comments        Pertinent Vitals/Pain Pain Assessment: Faces Faces Pain Scale: Hurts a little bit Pain Location: LLE residual limb Pain Descriptors / Indicators: Discomfort;Shooting Pain Intervention(s): Limited activity within patient's tolerance;Monitored during session;Repositioned    Home Living Family/patient expects to be discharged to:: Private residence Living Arrangements: Other relatives(grandmother, uncle, 49 y.o. daughter) Available Help at Discharge: Family;Available PRN/intermittently Type of Home: House Home Access: Stairs to enter Entrance Stairs-Rails: None Home Layout: One level Home Equipment: Wheelchair - [employer] - 2 wheels;Cane - single point;Crutches Additional Comments: Pt reports lowering  into bath tub to bathe, states there is no room for tub bench. concerns for safety with bathroom accessibility    Prior Function Level of Independence: Independent with assistive device(s)  Gait / Transfers Assistance Needed: prior to 08/2019 L BKA revision, pt ambulated with prosthesis and no AD. Since revision, pt has been mod I at w/c level.  ADL's / Homemaking Assistance Needed: reports PCA to assist with ADLs and IADLs as needed Comments:  Reports interesting method of stair negotiation. At the bottom of the steps, she stands, places wheelchair on steps (where she says it is completely secured), sits in wheelchair, unlocks wheelchair and pushes up with RLE up the step.    PT Goals (current goals can now be found in the care plan section) Acute Rehab PT Goals Patient Stated Goal: to go to rehab and then home ("I feel like I did myself a disservice when I went home last time instead of rehab") PT Goal Formulation: With patient Time For Goal Achievement: 10/26/19 Potential to Achieve Goals: Good    Frequency    Min 3X/week      PT Plan      Co-evaluation PT/OT/SLP Co-Evaluation/Treatment: Yes Reason for Co-Treatment: To address functional/ADL transfers PT goals addressed during session: Mobility/safety with mobility        AM-PAC PT "6 Clicks" Mobility   Outcome Measure  Help needed turning from your back to your side while in a flat bed without using bedrails?: None Help needed moving from lying on your back to sitting on the side of a flat bed without using bedrails?: None Help needed moving to and from a bed to a chair (including a wheelchair)?: A Little Help needed standing up from a chair using your arms (e.g., wheelchair or bedside chair)?: A Little Help needed to walk in hospital room?: A Little Help needed climbing 3-5 steps with a railing? : A Lot 6 Click Score: 19    End of Session Equipment Utilized During Treatment: Gait belt Activity Tolerance: Patient tolerated treatment well Patient left: in chair;with call bell/phone within reach;with chair alarm set Nurse Communication: Mobility status PT Visit Diagnosis: Other abnormalities of gait and mobility (R26.89);Difficulty in walking, not elsewhere classified (R26.2)     Time: 3545-6256 PT Time Calculation (min) (ACUTE ONLY): 31 min  Charges:                          [name] [name], PT, DPT Acute Rehabilitation Services Pager  [phone] Office [phone]    [name] [name] 10/12/2019, 3:28 PM

## 2019-10-12 NOTE — Evaluation (Signed)
Physical Therapy Evaluation Patient Details Name: [name] [name] MRN: [id number] DOB: 1975/09/06 Today's Date: 10/12/2019   History of Present Illness  Patient with left below knee amputation that unfortunately dehisced again. Pt is now s/p LLE knee amputation. Significant PMH of DM2, obesity, and bipolar disorder.   Clinical Impression  Pt evaluated s/p L AKA. PTA, she is modI, and lives with family members in a house with 2 steps to enter. She states she sleeps on an air mattress. On PT Evaluation, pt requiring min guard assist for low pivot transfer from bed to chair. She is interested in SNF to return to modI level following this surgery. I think this is appropriate in light of deficits (listed below) and decreased caregiver support. Initiated education regarding limb positioning and residual limb exercises.     Follow Up Recommendations Supervision for mobility/OOB;SNF (will need to follow up with pt on this; she isn't sure if her grandmother will want one in her home)    [facility] bed    Recommendations for Other Services       Precautions / Restrictions Precautions Precautions: Fall Precaution Comments: LLE wound vac Restrictions Weight Bearing Restrictions: Yes LLE Weight Bearing: Non weight bearing      Mobility  Bed Mobility Overal bed mobility: Modified Independent             General bed mobility comments: HOB up and use of rail  Transfers Overall transfer level: Needs assistance Equipment used: None Transfers: Squat Pivot Transfers     Squat pivot transfers: Min guard     General transfer comment: bed>recliner going to her right side; she was slow and cautious with transfer  Ambulation/Gait                Stairs            Wheelchair Mobility    Modified Rankin (Stroke Patients Only)       Balance Overall balance assessment: Needs assistance Sitting-balance support: No upper extremity  supported Sitting balance-Leahy Scale: Good     Standing balance support: Bilateral upper extremity supported Standing balance-Leahy Scale: Poor Standing balance comment: reliant on UE support                             Pertinent Vitals/Pain Pain Assessment: Faces Faces Pain Scale: Hurts a little bit Pain Location: LLE residual limb Pain Descriptors / Indicators: Discomfort;Shooting Pain Intervention(s): Limited activity within patient's tolerance;Monitored during session;Repositioned    Home Living Family/patient expects to be discharged to:: Private residence Living Arrangements: Other relatives(grandmother, uncle, 10 y.o. daughter) Available Help at Discharge: Family;Available PRN/intermittently Type of Home: House Home Access: Stairs to enter Entrance Stairs-Rails: None Entrance Stairs-Number of Steps: back porch - 2 Home Layout: One level Home Equipment: Wheelchair - [employer] - 2 wheels;Cane - single point;Crutches Additional Comments: Pt reports lowering into bath tub to bathe, states there is no room for tub bench. concerns for safety with bathroom accessibility    Prior Function Level of Independence: Independent with assistive device(s)   Gait / Transfers Assistance Needed: prior to 08/2019 L BKA revision, pt ambulated with prosthesis and no AD. Since revision, pt has been mod I at w/c level.   ADL's / Homemaking Assistance Needed: reports PCA to assist with ADLs and IADLs as needed  Comments: Reports interesting method of stair negotiation. At the bottom of the steps, she stands, places wheelchair on steps (where she says  it is completely secured), sits in wheelchair, unlocks wheelchair and pushes up with RLE up the step.      Hand Dominance   Dominant Hand: Right    Extremity/Trunk Assessment   Upper Extremity Assessment Upper Extremity Assessment: Defer to OT evaluation    Lower Extremity Assessment Lower Extremity Assessment: RLE  deficits/detail;LLE deficits/detail RLE Deficits / Details: Strength 5/5 LLE Deficits / Details: s/p AKA. At least anti gravity strength       Communication   Communication: No difficulties  Cognition Arousal/Alertness: Awake/alert Behavior During Therapy: WFL for tasks assessed/performed Overall Cognitive Status: Within Functional Limits for tasks assessed                                        General Comments      Exercises Other Exercises Other Exercises: Education provided on exercises including supine hip flexion, sidelying hip abduction, and glute squeezes.  Other Exercises: Education provided on prone positioning   Assessment/Plan    PT Assessment Patient needs continued PT services  PT Problem List Decreased strength;Decreased mobility;Decreased range of motion;Decreased balance;Decreased activity tolerance;Decreased knowledge of use of DME;Pain;Decreased knowledge of precautions;Decreased safety awareness       PT Treatment Interventions Therapeutic activities;DME instruction;Gait training;Therapeutic exercise;Patient/family education;Balance training;Stair training;Functional mobility training    PT Goals (Current goals can be found in the Care Plan section)  Acute Rehab PT Goals Patient Stated Goal: to go to rehab and then home ("I feel like I did myself a disservice when I went home last time instead of rehab") PT Goal Formulation: With patient Time For Goal Achievement: 10/26/19 Potential to Achieve Goals: Good    Frequency Min 3X/week   Barriers to discharge        Co-evaluation PT/OT/SLP Co-Evaluation/Treatment: Yes Reason for Co-Treatment: To address functional/ADL transfers PT goals addressed during session: Mobility/safety with mobility         AM-PAC PT "6 Clicks" Mobility  Outcome Measure Help needed turning from your back to your side while in a flat bed without using bedrails?: None Help needed moving from lying on your  back to sitting on the side of a flat bed without using bedrails?: None Help needed moving to and from a bed to a chair (including a wheelchair)?: A Little Help needed standing up from a chair using your arms (e.g., wheelchair or bedside chair)?: A Little Help needed to walk in hospital room?: A Little Help needed climbing 3-5 steps with a railing? : A Lot 6 Click Score: 19    End of Session Equipment Utilized During Treatment: Gait belt Activity Tolerance: Patient tolerated treatment well Patient left: in chair;with call bell/phone within reach;with chair alarm set Nurse Communication: Mobility status PT Visit Diagnosis: Other abnormalities of gait and mobility (R26.89);Difficulty in walking, not elsewhere classified (R26.2)    Time: 1224-8250 PT Time Calculation (min) (ACUTE ONLY): 31 min   Charges:   PT Evaluation $PT Eval Moderate Complexity: 1 Mod            Wyona Almas, PT, DPT Acute Rehabilitation Services Pager [phone] Office [phone]   Deno Etienne 10/12/2019, 3:33 PM

## 2019-10-12 NOTE — Progress Notes (Signed)
Patient ID: [name] [name], female   DOB: Jan 08, 1976, 44 y.o.   MRN: [id number] Patient is postoperative day 1 left above-the-knee amputation.  There is no drainage in the wound VAC canister patient complains of neuropathic pain but she states that it is controlled with Dilaudid.  She is on Lyrica 50 mg 2 times a day.  She states that she normally takes 100 mg 3 times a day.  I will increase her dose of Lyrica.  Will have [facility] evaluate patient on Monday to discuss prosthetic fitting

## 2019-10-12 NOTE — Evaluation (Signed)
Occupational Therapy Evaluation Patient Details Name: [name] [name] MRN: [id number] DOB: 05/29/1975 Today's Date: 10/12/2019    History of Present Illness Patient with left below knee amputation that unfortunately dehisced again. Pt is not s/p LLE AKA   Clinical Impression   This 44 yo female admitted with above presents to acute OT with PLOF at a BKA level of Mod I. Currently pt is setup/S-min guard A for all mobility and basic ADLs. She will benefit from acute OT with follow up at SNF to get back to a Mod I level at AKA level.    Follow Up Recommendations  Supervision/Assistance - 24 hour;SNF    Equipment Recommendations  Other (comment)(TBD next venue)       Precautions / Restrictions Precautions Precautions: Fall Precaution Comments: LLE wound vac Restrictions Weight Bearing Restrictions: Yes LLE Weight Bearing: Non weight bearing      Mobility Bed Mobility Overal bed mobility: Modified Independent             General bed mobility comments: HOB up and use of rail  Transfers Overall transfer level: Needs assistance Equipment used: None Transfers: Squat Pivot Transfers     Squat pivot transfers: Min guard     General transfer comment: bed>recliner going to her right side; she was slow and cautious with transfer    Balance Overall balance assessment: Needs assistance Sitting-balance support: No upper extremity supported(RLE supported) Sitting balance-Leahy Scale: Good     Standing balance support: Bilateral upper extremity supported Standing balance-Leahy Scale: Poor Standing balance comment: reliant on UE support                           ADL either performed or assessed with clinical judgement   ADL Overall ADL's : Needs assistance/impaired Eating/Feeding: Independent;Sitting   Grooming: Set up;Sitting   Upper Body Bathing: Set up;Sitting   Lower Body Bathing: Min guard;Sit to/from stand   Upper Body Dressing : Set  up;Sitting   Lower Body Dressing: Min guard;Sit to/from stand   Toilet Transfer: Min guard;Squat-pivot   Toileting- Water quality scientist and Hygiene: Min guard;Sit to/from stand               Vision Patient Visual Report: No change from baseline              Pertinent Vitals/Pain Pain Assessment: Faces Faces Pain Scale: Hurts a little bit Pain Location: LLE residual limb Pain Descriptors / Indicators: Discomfort;Shooting Pain Intervention(s): Limited activity within patient's tolerance;Monitored during session;Repositioned     Hand Dominance  right   Extremity/Trunk Assessment Upper Extremity Assessment Upper Extremity Assessment: Overall WFL for tasks assessed           Communication Communication Communication: No difficulties   Cognition Arousal/Alertness: Awake/alert Behavior During Therapy: WFL for tasks assessed/performed Overall Cognitive Status: Within Functional Limits for tasks assessed                                                Home Living Family/patient expects to be discharged to:: Private residence Living Arrangements: Other relatives(grandmother, uncle) Available Help at Discharge: Family;Available PRN/intermittently Type of Home: House Home Access: Stairs to enter [employer] of Steps: back porch - 2 Entrance Stairs-Rails: None Home Layout: One level     Bathroom Shower/Tub: Retail buyer Accessibility:  No   Home Equipment: Wheelchair - [employer] - 2 wheels;Cane - single point;Crutches          Prior Functioning/Environment Level of Independence: Independent with assistive device(s)                 OT Problem List: Impaired balance (sitting and/or standing);Obesity;Pain      OT Treatment/Interventions: Self-care/ADL training;DME and/or AE instruction;Patient/family education;Balance training;Therapeutic exercise    OT Goals(Current goals can  be found in the care plan section) Acute Rehab OT Goals Patient Stated Goal: to go to rehab and then home ("I feel like I did myself a disservice when I went home last time instead of rehab") OT Goal Formulation: With patient Time For Goal Achievement: 10/26/19 Potential to Achieve Goals: Good  OT Frequency: Min 2X/week   Barriers to D/C: Decreased caregiver support             AM-PAC OT "6 Clicks" Daily Activity     Outcome Measure Help from another person eating meals?: None Help from another person taking care of personal grooming?: A Little Help from another person toileting, which includes using toliet, bedpan, or urinal?: A Little Help from another person bathing (including washing, rinsing, drying)?: A Little Help from another person to put on and taking off regular upper body clothing?: A Little Help from another person to put on and taking off regular lower body clothing?: A Little 6 Click Score: 19   End of Session Equipment Utilized During Treatment: Gait belt Nurse Communication: Mobility status  Activity Tolerance: Patient tolerated treatment well Patient left: in chair;with call bell/phone within reach;with chair alarm set  OT Visit Diagnosis: Unsteadiness on feet (R26.81);Pain;History of falling (Z91.81) Pain - Right/Left: Left Pain - part of body: Leg                Time: 3299-2426 OT Time Calculation (min): 26 min Charges:  OT General Charges $OT Visit: 1 Visit OT Evaluation $OT Eval Moderate Complexity: 1 Mod  [place] Circle, OTR/L Acute [employer] Pager [phone] Office [phone]     Almon Register 10/12/2019, 1:08 PM

## 2019-10-13 LAB — GLUCOSE, CAPILLARY
Glucose-Capillary: 131 mg/dL — ABNORMAL HIGH (ref 70–99)
Glucose-Capillary: 133 mg/dL — ABNORMAL HIGH (ref 70–99)
Glucose-Capillary: 171 mg/dL — ABNORMAL HIGH (ref 70–99)
Glucose-Capillary: 174 mg/dL — ABNORMAL HIGH (ref 70–99)
Glucose-Capillary: 266 mg/dL — ABNORMAL HIGH (ref 70–99)

## 2019-10-13 NOTE — Progress Notes (Signed)
   Subjective: 2 Days Post-Op Procedure(s) (LRB): LEFT ABOVE KNEE AMPUTATION (Left) Patient reports pain as patient asleep , wakes up and said she is in pain. while talking with her she falls back asleep. .    Objective: Vital signs in last 24 hours: Temp:  [97.4 F (36.3 C)-97.8 F (36.6 C)] 97.8 F (36.6 C) (05/23 0600) Pulse Rate:  [78-83] 82 (05/23 0600) Resp:  [17-18] 17 (05/23 0600) BP: (95-116)/(53-62) 116/62 (05/23 0600) SpO2:  [99 %-100 %] 99 % (05/23 0600)  Intake/Output from previous day: 05/22 0701 - 05/23 0700 In: 1051.5 [P.O.:820; I.V.:231.5] Out: 900 [Urine:900] Intake/Output this shift: No intake/output data recorded.  Recent Labs    10/11/19 0916  HGB 10.2*   Recent Labs    10/11/19 0916  WBC 5.6  RBC 3.94  HCT 33.0*  PLT 358   Recent Labs    10/11/19 0916  NA 135  K 4.8  CL 104  CO2 21*  BUN 32*  CREATININE 2.10*  GLUCOSE 148*  CALCIUM 8.8*   No results for input(s): LABPT, INR in the last 72 hours.  dressing intact.  No results found.  Assessment/Plan: 2 Days Post-Op Procedure(s) (LRB): LEFT ABOVE KNEE AMPUTATION (Left) Encourage protein, better nutrition.   Marybelle Killings 10/13/2019, 7:30 AM

## 2019-10-14 ENCOUNTER — Inpatient Hospital Stay (HOSPITAL_COMMUNITY): Admission: RE | Admit: 2019-10-14 | Payer: Medicare HMO | Source: Ambulatory Visit

## 2019-10-14 LAB — GLUCOSE, CAPILLARY
Glucose-Capillary: 167 mg/dL — ABNORMAL HIGH (ref 70–99)
Glucose-Capillary: 150 mg/dL — ABNORMAL HIGH (ref 70–99)
Glucose-Capillary: 212 mg/dL — ABNORMAL HIGH (ref 70–99)
Glucose-Capillary: 152 mg/dL — ABNORMAL HIGH (ref 70–99)
Glucose-Capillary: 121 mg/dL — ABNORMAL HIGH (ref 70–99)

## 2019-10-14 LAB — SURGICAL PATHOLOGY

## 2019-10-14 MED ORDER — ADULT MULTIVITAMIN W/MINERALS CH
1.0000 | ORAL_TABLET | Freq: Every day | ORAL | Status: DC
  Administered 2019-10-14 – 2019-10-18 (×5): 1 via ORAL
  Filled 2019-10-14 (×5): qty 1

## 2019-10-14 MED ORDER — JUVEN PO PACK
1.0000 | PACK | Freq: Two times a day (BID) | ORAL | Status: DC
  Administered 2019-10-15 – 2019-10-18 (×7): 1 via ORAL
  Filled 2019-10-14 (×7): qty 1

## 2019-10-14 MED ORDER — ENSURE MAX PROTEIN PO LIQD
11.0000 [oz_av] | Freq: Every day | ORAL | Status: DC
  Administered 2019-10-14 – 2019-10-17 (×4): 11 [oz_av] via ORAL
  Filled 2019-10-14 (×5): qty 330

## 2019-10-14 NOTE — Progress Notes (Signed)
Initial Nutrition Assessment  DOCUMENTATION CODES:   Morbid obesity  INTERVENTION:   -MVI with minerals daily -Ensure Max po daily, each supplement provides 150 kcal and 30 grams of protein -1 packet Juven BID, each packet provides 95 calories, 2.5 grams of protein (collagen), and 9.8 grams of carbohydrate (3 grams sugar); also contains 7 grams of L-arginine and L-glutamine, 300 mg vitamin C, 15 mg vitamin E, 1.2 mcg vitamin B-12, 9.5 mg zinc, 200 mg calcium, and 1.5 g  Calcium Beta-hydroxy-Beta-methylbutyrate to support wound healing -Double protein portions with meals  NUTRITION DIAGNOSIS:   Increased nutrient needs related to post-op healing, wound healing as evidenced by estimated needs.  GOAL:   Patient will meet greater than or equal to 90% of their needs  MONITOR:   PO intake, Supplement acceptance, Labs, Weight trends, Skin, I & O's  REASON FOR ASSESSMENT:   Consult Assessment of nutrition requirement/status  ASSESSMENT:   [name] [name] is a  44 y.o. female with a diagnosis of Dehiscence Left Below Knee Amputation who failed conservative measures and elected for surgical management.  5/21- s/p PROCEDURE:  LEFT ABOVE KNEE AMPUTATION Application of Prevena Ortho form and customizable wound VAC sponge  Reviewed I/O's: +319 ml x 24 hours and +1.8 L since admission  Pt with with MD at time of visit.   Pt with good appetite; noted meal completion 100%. Pt would greatly benefit from addition of nutritional supplements, due to increased nutritional needs for wound healing.  Plan for possible CIR at discharge.   Medications reviewed and include colace.   Lab Results  Component Value Date   HGBA1C 8.5 (H) 09/04/2019   PTA DM medications are 1.8 mg liraglutide daily.   Labs reviewed: CBGS: 133-266 (inpatient orders for glycemic control are 0-15 units insulin aspart TID with meals and 1.8 mg liraglutide daily).   Diet Order:   Diet Order            Diet  Carb Modified Fluid consistency: Thin; Room service appropriate? Yes  Diet effective now              EDUCATION NEEDS:   No education needs have been identified at this time  Skin:  Skin Assessment: Skin Integrity Issues: Skin Integrity Issues:: Wound VAC Wound Vac: lt leg s/p AKA  Last BM:  10/13/19  Height:   Ht Readings from Last 1 Encounters:  10/11/19 5' 9.5" (1.765 m)    Weight:   Wt Readings from Last 1 Encounters:  10/11/19 127 kg    Ideal Body Weight:  61.7 kg(adjusted for lt AKA)  BMI:  Body mass index is 40.76 kg/m.  Estimated Nutritional Needs:   Kcal:  1950-2150  Protein:  120-135 grams  Fluid:  > 1.9 L    Loistine Chance, RD, LDN, [place] Registered Dietitian II Certified Diabetes Care and Education Specialist Please refer to [facility] for RD and/or RD on-call/weekend/after hours pager

## 2019-10-14 NOTE — NC FL2 (Signed)
[place] MEDICAID FL2 LEVEL OF CARE SCREENING TOOL     IDENTIFICATION  Patient Name: [name] [name] Birthdate: 05-15-1976 Sex: female Admission Date (Current Location): 10/11/2019  [facility] and [place] Number:  Herbalist and Address:  [facility], [place] [address] [place], [place] [address]      Provider Number: [id number]  Attending Physician Name and Address:  Newt Minion, MD  Relative Name and Phone Number:       Current Level of Care: Hospital Recommended Level of Care: [place] Prior Approval Number:    Date Approved/Denied:   PASRR Number: [id number] (under [place] on [facility])  Discharge Plan: SNF    Current Diagnoses: Patient Active Problem List   Diagnosis Date Noted  . Blurry vision, right eye 10/08/2019  . Wound dehiscence 09/04/2019  . Right carpal tunnel syndrome 06/14/2019  . Left carpal tunnel syndrome 06/14/2019  . Tobacco use 03/01/2019  . Phantom limb pain ([place]) 11/08/2018  . Current moderate episode of major depressive disorder ([place])   . Anemia of chronic disease   . Severe protein-calorie malnutrition ([place])   . Dehiscence of amputation stump ([place])   . Fall   . Status post below knee amputation, left ([place]) 04/11/2017  . Bipolar affective disorder ([place])   . Adjustment disorder with depressed mood 03/26/2017  . Heart failure with preserved ejection fraction ([place]) 02/09/2017  . Routine adult health maintenance 12/08/2016  . Chronic pain of right knee 09/08/2016  . Chronic kidney disease (CKD), stage III (moderate) 07/15/2016  . OSA (obstructive sleep apnea) 05/13/2016  . Morbid obesity due to excess calories ([place]) 11/27/2015  . Hypothyroidism 04/25/2013  . Controlled type 2 diabetes mellitus ([place]) 07/12/2012  . Carpal tunnel syndrome, bilateral 01/25/2012  . Diabetic polyneuropathy associated with type 2 diabetes mellitus ([place]) 07/04/2011  . Anxiety and depression 06/02/2011  . GERD  12/06/2007  . Hyperlipidemia 08/29/2007  . Hypertension associated with diabetes ([place]) 08/29/2007    Orientation RESPIRATION BLADDER Height & Weight     Self, Time, Situation, Place  Normal Continent Weight: 280 lb (127 kg) Height:  5' 9.5" (176.5 cm)  BEHAVIORAL SYMPTOMS/MOOD NEUROLOGICAL BOWEL NUTRITION STATUS      Continent Diet(see discharge summary)  AMBULATORY STATUS COMMUNICATION OF NEEDS Skin   Extensive Assist Verbally Surgical wounds, Wound Vac(incision (previous BKA now AKA on left leg) with prevena wound vac.)                       Personal Care Assistance Level of Assistance  Bathing, Feeding, Dressing Bathing Assistance: Maximum assistance Feeding assistance: Independent Dressing Assistance: Maximum assistance     Functional Limitations Info  Sight, Hearing, Speech Sight Info: Adequate Hearing Info: Adequate Speech Info: Adequate    SPECIAL CARE FACTORS FREQUENCY  PT (By licensed PT), OT (By licensed OT)     PT Frequency: 5x week OT Frequency: 5x week            Contractures Contractures Info: Not present    Additional Factors Info  Code Status, Allergies, Psychotropic, Insulin Sliding Scale Code Status Info: Full Code Allergies Info: Vancomycin Psychotropic Info: amitriptyline (ELAVIL) tablet 50 mg daily at bedtime PO; FLUoxetine (PROZAC) capsule 60 mg daily PO Insulin Sliding Scale Info: insulin aspart (novoLOG) injection 0-15 Units 3x daily with meals; liraglutide (VICTOZA) SOPN 1.8 mg daily       Current Medications (10/14/2019):  This is the current hospital active medication list Current  Facility-Administered Medications  Medication Dose Route Frequency Provider Last Rate Last Admin  . 0.9 %  sodium chloride infusion   Intravenous Continuous Persons, Bevely Palmer, [place] 75 mL/hr at 10/14/19 0653 New Bag at 10/14/19 8101  . acetaminophen (TYLENOL) tablet 325-650 mg  325-650 mg Oral Q6H PRN Persons, Bevely Palmer, PA      . allopurinol (ZYLOPRIM)  tablet 100 mg  100 mg Oral BID Persons, Bevely Palmer, PA   100 mg at 10/14/19 1012  . amitriptyline (ELAVIL) tablet 50 mg  50 mg Oral QHS Persons, Bevely Palmer, PA   50 mg at 10/13/19 2116  . amLODipine (NORVASC) tablet 10 mg  10 mg Oral Daily Persons, Bevely Palmer, PA   10 mg at 10/14/19 1012  . atorvastatin (LIPITOR) tablet 20 mg  20 mg Oral Daily Persons, Bevely Palmer, PA   20 mg at 10/14/19 1012  . bumetanide (BUMEX) tablet 1 mg  1 mg Oral BID Persons, Bevely Palmer, PA   1 mg at 10/14/19 0750  . docusate sodium (COLACE) capsule 100 mg  100 mg Oral BID Persons, Bevely Palmer, PA   100 mg at 10/14/19 1012  . FLUoxetine (PROZAC) capsule 60 mg  60 mg Oral Daily Newt Minion, MD   60 mg at 10/14/19 1012  . HYDROmorphone (DILAUDID) injection 0.5-1 mg  0.5-1 mg Intravenous Q4H PRN Persons, Bevely Palmer, PA   1 mg at 10/14/19 0751  . hydrOXYzine (ATARAX/VISTARIL) tablet 25 mg  25 mg Oral Q6H PRN Persons, Bevely Palmer, PA      . insulin aspart (novoLOG) injection 0-15 Units  0-15 Units Subcutaneous TID WC Persons, Bevely Palmer, [place]   2 Units at 10/14/19 0750  . liraglutide (VICTOZA) SOPN 1.8 mg  1.8 mg Subcutaneous Daily Skeet Simmer, RPH   1.8 mg at 10/13/19 0940  . lisinopril (ZESTRIL) tablet 10 mg  10 mg Oral Daily Persons, Bevely Palmer, PA   10 mg at 10/14/19 1012  . methocarbamol (ROBAXIN) tablet 500 mg  500 mg Oral Q6H PRN Persons, Bevely Palmer, PA   500 mg at 10/12/19 1305   Or  . methocarbamol (ROBAXIN) 500 mg in dextrose 5 % 50 mL IVPB  500 mg Intravenous Q6H PRN Persons, Bevely Palmer, PA      . metoCLOPramide (REGLAN) tablet 5-10 mg  5-10 mg Oral Q8H PRN Persons, Bevely Palmer, PA       Or  . metoCLOPramide (REGLAN) injection 5-10 mg  5-10 mg Intravenous Q8H PRN Persons, Bevely Palmer, PA      . metoprolol succinate (TOPROL-XL) 24 hr tablet 12.5 mg  12.5 mg Oral Daily Persons, Bevely Palmer, PA   12.5 mg at 10/14/19 1012  . ondansetron (ZOFRAN) tablet 4 mg  4 mg Oral Q6H PRN Persons, Bevely Palmer, PA       Or  . ondansetron [facility])  injection 4 mg  4 mg Intravenous Q6H PRN Persons, Bevely Palmer, [place]      . oxyCODONE (Oxy IR/ROXICODONE) immediate release tablet 5-10 mg  5-10 mg Oral Q4H PRN Persons, Bevely Palmer, PA   5 mg at 10/14/19 0418  . pregabalin (LYRICA) capsule 100 mg  100 mg Oral TID Newt Minion, MD   100 mg at 10/14/19 1012  . sodium bicarbonate tablet 650 mg  650 mg Oral BID Persons, Bevely Palmer, PA   650 mg at 10/14/19 1013  . sodium zirconium cyclosilicate (LOKELMA) packet 10 g  10 g Oral BID Persons, Bevely Palmer, [place]  10 g at 10/14/19 1011     Discharge Medications: Please see discharge summary for a list of discharge medications.  Relevant Imaging Results:  Relevant Lab Results:   Additional Information SSN: [id number]  [place]

## 2019-10-14 NOTE — Progress Notes (Signed)
Orthopedic Tech Progress Note Patient Details:  ANMOL PASCHEN 08-Jul-1975 [id number] Called in order to [facility] for a CUSTOM PROSTHETIC SHRINKER Patient ID: TORRENCE BRANAGAN, female   DOB: [birth year], 45 y.o.   MRN: [id number]   Janit Pagan 10/14/2019, 8:23 AM

## 2019-10-14 NOTE — Progress Notes (Signed)
Physical Therapy Treatment Patient Details Name: [name] [name] MRN: [id number] DOB: [birth year] Today's Date: 10/14/2019    History of Present Illness Patient with left below knee amputation that unfortunately dehisced again. Pt is now s/p LLE knee amputation. Significant PMH of DM2, obesity, and bipolar disorder.     PT Comments    Patient received in recliner, rolling around room trying to find headphone. Patient is agreeable to PT session. Performed squat pivot from recliner to bed with min guard, then sit to stand from bed and ambulated a total of 15 feet with RW and min guard assist. Slow, step to gait. Increased pain with ambulation. Patient will continue to benefit from skilled PT while here to improve activity tolerance and independence with mobility.         Follow Up Recommendations  Supervision for mobility/OOB;CIR;SNF     Equipment Recommendations       Recommendations for Other Services Rehab consult     Precautions / Restrictions Precautions Precautions: Fall Restrictions Weight Bearing Restrictions: Yes LLE Weight Bearing: Non weight bearing    Mobility  Bed Mobility               General bed mobility comments: Patient received up in recliner  Transfers Overall transfer level: Needs assistance Equipment used: Rolling walker (2 wheeled) Transfers: Sit to/from Stand Sit to Stand: Min guard            Ambulation/Gait Ambulation/Gait assistance: Min guard Gait Distance (Feet): 15 Feet Assistive device: Rolling walker (2 wheeled) Gait Pattern/deviations: Step-to pattern Gait velocity: decreased   General Gait Details: Patient able to hop 7 feet then turn around and 7-8 feet more back to recliner. Cues given to keep residual limb down at side rather than flexed up with standing.   Stairs             Wheelchair Mobility    Modified Rankin (Stroke Patients Only)       Balance Overall balance assessment: Needs  assistance Sitting-balance support: Feet supported Sitting balance-Leahy Scale: Good     Standing balance support: Bilateral upper extremity supported;During functional activity Standing balance-Leahy Scale: Fair Standing balance comment: reliant on UE support, supervision to min guard support for safety                            Cognition Arousal/Alertness: Awake/alert Behavior During Therapy: WFL for tasks assessed/performed Overall Cognitive Status: Within Functional Limits for tasks assessed                                        Exercises Total Joint Exercises Quad Sets: AROM;Left;10 reps;Seated    General Comments        Pertinent Vitals/Pain Pain Assessment: 0-10 Pain Score: 10-Worst pain ever Pain Location: LLE residual limb with movement Pain Descriptors / Indicators: Discomfort;Operative site guarding Pain Intervention(s): Monitored during session    Home Living                      Prior Function            PT Goals (current goals can now be found in the care plan section) Acute Rehab PT Goals Patient Stated Goal: to go to rehab and then home ("I feel like I did myself a disservice when I went home last time instead of rehab") PT  Goal Formulation: With patient Time For Goal Achievement: 10/26/19 Potential to Achieve Goals: Good Progress towards PT goals: Progressing toward goals    Frequency    Min 3X/week      PT Plan Current plan remains appropriate    Co-evaluation              AM-PAC PT "6 Clicks" Mobility   Outcome Measure  Help needed turning from your back to your side while in a flat bed without using bedrails?: None Help needed moving from lying on your back to sitting on the side of a flat bed without using bedrails?: None Help needed moving to and from a bed to a chair (including a wheelchair)?: A Little Help needed standing up from a chair using your arms (e.g., wheelchair or bedside  chair)?: A Little Help needed to walk in hospital room?: A Little Help needed climbing 3-5 steps with a railing? : A Lot 6 Click Score: 19    End of Session Equipment Utilized During Treatment: Gait belt Activity Tolerance: Patient tolerated treatment well Patient left: in chair;with chair alarm set;with call bell/phone within reach Nurse Communication: Mobility status PT Visit Diagnosis: Other abnormalities of gait and mobility (R26.89);Difficulty in walking, not elsewhere classified (R26.2);Pain;Muscle weakness (generalized) (M62.81) Pain - Right/Left: Left Pain - part of body: Leg     Time: 4403-4742 PT Time Calculation (min) (ACUTE ONLY): 20 min  Charges:  $Gait Training: 8-22 mins                     Kristel Durkee, PT, GCS 10/14/19,1:59 PM

## 2019-10-14 NOTE — TOC Initial Note (Signed)
Transition of Care [facility]) - Initial/Assessment Note    Patient Details  Name: [name] [name] MRN: [id number] Date of Birth: 03/07/1976  Transition of Care [facility]) CM/SW Contact:    Alexander Mt, LCSW Phone Number: 10/14/2019, 10:24 AM  Clinical Narrative:                 CSW met with pt at bedside. Introduced self, role, reason for visit. Pt lethargic but able to answer assessment questions. Pt from home w/ her grandmother and 101 year old daughter. We discussed current recommendations. We discussed that pt previously had been denied for CIR so having a back up plan would likely be best. Due to her weightbearing restrictions etc pt feels it may be safer for her healing to be at a SNF. She is okay with referrals being sent but states there is a facility she is not interested in, CSW has noted that and will not send referral there.   CSW also has spoken to [facility] admissions coordinator about referral and she will assess pt before CSW initiates formal insurance auth etc.   Expected Discharge Plan: Skilled Nursing Facility Barriers to Discharge: Continued Medical Work up, Ship broker   Patient Goals and CMS Choice Patient states their goals for this hospitalization and ongoing recovery are:: go somewhere where I can stay off the amputation site for it to heal CMS Medicare.gov Compare Post Acute Care list provided to:: Patient Choice offered to / list presented to : Patient  Expected Discharge Plan and Services Expected Discharge Plan: [place] In-house Referral: Clinical Social Work Discharge Planning Services: CM Consult Post Acute Care Choice: [place], IP Rehab Living arrangements for the past 2 months: Single Family Home  Prior Living Arrangements/Services Living arrangements for the past 2 months: Single Family Home Lives with:: Minor Children, Relatives Patient language and need for interpreter reviewed:: Yes(no needs) Do you feel safe  going back to the place where you live?: Yes      Need for Family Participation in Patient Care: Yes (Comment)(assistance w/ daily cares) Care giver support system in place?: Yes (comment)(grandmother; relatives) Current home services: DME, Home PT Criminal Activity/Legal Involvement Pertinent to Current Situation/Hospitalization: No - Comment as needed  Activities of Daily Living Home Assistive Devices/Equipment: Environmental consultant (specify type), Wheelchair ADL Screening (condition at time of admission) Patient's cognitive ability adequate to safely complete daily activities?: Yes Is the patient deaf or have difficulty hearing?: No Does the patient have difficulty seeing, even when wearing glasses/contacts?: No Does the patient have difficulty concentrating, remembering, or making decisions?: No Patient able to express need for assistance with ADLs?: Yes Does the patient have difficulty dressing or bathing?: No Independently performs ADLs?: Yes (appropriate for developmental age) Communication: Independent Dressing (OT): Independent Is this a change from baseline?: Pre-admission baseline Grooming: Independent Is this a change from baseline?: Pre-admission baseline Feeding: Independent Bathing: Independent Is this a change from baseline?: Pre-admission baseline Toileting: Independent Is this a change from baseline?: Pre-admission baseline In/Out Bed: Needs assistance Is this a change from baseline?: Pre-admission baseline Walks in Home: Needs assistance Is this a change from baseline?: Pre-admission baseline Does the patient have difficulty walking or climbing stairs?: Yes Weakness of Legs: Right(left aka) Weakness of Arms/Hands: None  Permission Sought/Granted Permission sought to share information with : Facility Art therapist granted to share information with : Yes, Verbal Permission Granted     Permission granted to share info w AGENCY: CIR/SNFs w/ the exception of  [facility]        Emotional Assessment Appearance:: Appears stated age Attitude/Demeanor/Rapport: Lethargic Affect (typically observed): Accepting, Adaptable Orientation: : Oriented to Self, Oriented to Place, Oriented to Situation, Oriented to  Time Alcohol / Substance Use: Not Applicable Psych Involvement: (n/a)  Admission diagnosis:  Wound dehiscence [T81.30XA] Patient Active Problem List   Diagnosis Date Noted  . Blurry vision, right eye 10/08/2019  . Wound dehiscence 09/04/2019  . Right carpal tunnel syndrome 06/14/2019  . Left carpal tunnel syndrome 06/14/2019  . Tobacco use 03/01/2019  . Phantom limb pain ([place]) 11/08/2018  . Current moderate episode of major depressive disorder ([place])   . Anemia of chronic disease   . Severe protein-calorie malnutrition ([place])   . Dehiscence of amputation stump ([place])   . Fall   . Status post below knee amputation, left ([place]) 04/11/2017  . Bipolar affective disorder ([place])   . Adjustment disorder with depressed mood 03/26/2017  . Heart failure with preserved ejection fraction ([place]) 02/09/2017  . Routine adult health maintenance 12/08/2016  . Chronic pain of right knee 09/08/2016  . Chronic kidney disease (CKD), stage III (moderate) 07/15/2016  . OSA (obstructive sleep apnea) 05/13/2016  . Morbid obesity due to excess calories ([place]) 11/27/2015  . Hypothyroidism 04/25/2013  . Controlled type 2 diabetes mellitus ([place]) 07/12/2012  . Carpal tunnel syndrome, bilateral 01/25/2012  . Diabetic polyneuropathy associated with type 2 diabetes mellitus ([place]) 07/04/2011  . Anxiety and depression 06/02/2011  . GERD 12/06/2007  . Hyperlipidemia 08/29/2007  . Hypertension associated with diabetes ([place]) 08/29/2007   PCP:  Ina Homes, MD Pharmacy:   [facility] [place], [place] - [address] [place] [address] Phone: [phone] Fax: [phone]     Readmission Risk Interventions Readmission Risk  Prevention Plan 10/14/2019  Transportation Screening Complete  PCP or Specialist Appt within 3-5 Days Not Complete  Not Complete comments plan for rehab  [place] or [place] Complete  Social Work Consult for [place] Planning/Counseling Complete  Palliative Care Screening Not Applicable  Medication Review (RN Care Manager) Referral to Pharmacy  Some recent data might be hidden

## 2019-10-14 NOTE — Progress Notes (Signed)
Occupational Therapy Treatment Patient Details Name: [name] [name] MRN: [id number] DOB: 06-Oct-1975 Today's Date: 10/14/2019    History of present illness Patient with left below knee amputation that unfortunately dehisced again. Pt is now s/p LLE knee amputation. Significant PMH of DM2, obesity, and bipolar disorder.    OT comments  Pt sitting up in recliner finishing lunch upon arrival. Pt highly motivated on this date, family on speaker phone throughout session. Instructed pt in BUE HEP to increase strength for improvement in transfers/functional mobility. Educated pt on fall prevention techniques, specifically RW management. Assessed toileting and LB dressing. Pt adjusted R sock in sitting position with supervision. Pt performed functional transfer from recliner>BSC (8 steps away). Pt required min guard to hop 8 steps and verbal cues for managing lines/RW safely using RW for support. Pt urinated in [facility]. Pt required min guard for balance in order to perform perineal care in standing position. Pt able to maintain balance while simultaneously wipe herself without LOB. Will continue to follow acutely as able.    Follow Up Recommendations  Supervision/Assistance - 24 hour;SNF    Equipment Recommendations       Recommendations for Other Services      Precautions / Restrictions Precautions Precautions: Fall Precaution Comments: LLE wound vac Restrictions Weight Bearing Restrictions: Yes LLE Weight Bearing: Non weight bearing       Mobility Bed Mobility               General bed mobility comments: Patient received up in recliner  Transfers Overall transfer level: Needs assistance Equipment used: Rolling walker (2 wheeled) Transfers: Sit to/from Stand Sit to Stand: Min guard         General transfer comment: recliner>BSC across room; hopped ~8 steps with min guard using RW for support; no LOB    Balance Overall balance assessment: Needs assistance Sitting-balance  support: Feet supported Sitting balance-Leahy Scale: Good     Standing balance support: Bilateral upper extremity supported;During functional activity Standing balance-Leahy Scale: Fair Standing balance comment: supervison-min guard for balance using RW for support                           ADL either performed or assessed with clinical judgement   ADL Overall ADL's : Needs assistance/impaired Eating/Feeding: Independent;Sitting                       Toilet Transfer: Min guard;Cueing for safety;Cueing for sequencing;Ambulation;BSC;RW Toilet Transfer Details (indicate cue type and reason): hopped on RLE to [facility]- about 8 steps with min guard using RW for support and v/c's for movement technique/safety Toileting- Clothing Manipulation and Hygiene: Min guard;Cueing for safety;Sit to/from stand Toileting - Clothing Manipulation Details (indicate cue type and reason): pericare in standing with min guard from therapist for safety; utilizing RW for support      Functional mobility during ADLs: Min guard;Cueing for safety;Rolling walker General ADL Comments: no LOB noted; pt comfortable standing/hopping on one lower extremity     Vision       Perception     Praxis      Cognition Arousal/Alertness: Awake/alert Behavior During Therapy: WFL for tasks assessed/performed Overall Cognitive Status: Within Functional Limits for tasks assessed                                 General Comments: pt highly motivated and cooperative  Exercises Exercises: General Upper Extremity Total Joint Exercises Quad Sets: AROM;Left;10 reps;Seated General Exercises - Upper Extremity Shoulder Flexion: AROM;Strengthening;Both;20 reps;Seated Shoulder ABduction: AROM;Strengthening;Both;20 reps;Seated Elbow Flexion: AROM;Strengthening;Both;20 reps;Seated Elbow Extension: AROM;Strengthening;Both;20 reps;Seated   Shoulder Instructions       General Comments  educated pt on BUE exercises to improve strength for functional mobility/transfers    Pertinent Vitals/ Pain       Pain Assessment: 0-10 Pain Score: 10-Worst pain ever Faces Pain Scale: Hurts little more Pain Location: LLE residual limb with movement Pain Descriptors / Indicators: Discomfort;Operative site guarding Pain Intervention(s): Monitored during session;Repositioned  Home Living                                          Prior Functioning/Environment              Frequency  Min 2X/week        Progress Toward Goals  OT Goals(current goals can now be found in the care plan section)     Acute Rehab OT Goals Patient Stated Goal: go to rehab and go home so I can take care of my family  Plan Discharge plan remains appropriate    Co-evaluation                 AM-PAC OT "6 Clicks" Daily Activity     Outcome Measure   Help from another person eating meals?: None Help from another person taking care of personal grooming?: None Help from another person toileting, which includes using toliet, bedpan, or urinal?: A Little Help from another person bathing (including washing, rinsing, drying)?: A Little Help from another person to put on and taking off regular upper body clothing?: A Little Help from another person to put on and taking off regular lower body clothing?: A Little 6 Click Score: 20    End of Session Equipment Utilized During Treatment: Gait belt;Rolling walker  OT Visit Diagnosis: Unsteadiness on feet (R26.81);Pain;History of falling (Z91.81) Pain - Right/Left: Left Pain - part of body: (left residual limb)   Activity Tolerance Patient tolerated treatment well   Patient Left in chair;with call bell/phone within reach;with chair alarm set   Nurse Communication Mobility status        Time: 9233-0076 OT Time Calculation (min): 38 min  Charges: OT General Charges $OT Visit: 1 Visit OT Treatments $Self Care/Home  Management : 23-37 mins $Therapeutic Exercise: 8-22 mins  [name], OTR/L [facility] [place] 10/14/2019, 5:13 PM

## 2019-10-14 NOTE — Progress Notes (Signed)
Patient ID: [name] [name], female   DOB: 09-Feb-1976, 44 y.o.   MRN: [id number] Patient is sitting in a chair without complaints at this time is no drainage in the wound VAC canister there is a good suction fit.  Patient states she would like to try inpatient rehabilitation.  We will have [facility] come by to talk with her about her prosthesis and will place orders for inpatient rehab.  She states that Lyrica helps with her fibromyalgia.

## 2019-10-14 NOTE — Progress Notes (Signed)
Inpatient Rehabilitation-Admissions Coordinator   CIR consult received. Met with pt in her room for rehab assessment. Pt on BSC and bathing herself upon entering. We discussed CIR program details and expectations. Pt is familiar with program as she has been with [place] in the past. We discussed the current recommended program (SNF) and the differences between the two programs. Ultimately, due to time constraints of CIR program (typically shorter length of stay compared to SNF), her current living situation, and benefits of a longer program for wound healing, pt has decided on SNF placement.   As current PT/OT recommendations are for SNF, it is unlikely pt's insurance would approve CIR. Agree with therapy and pt decision for SNF. AC will sign off. TOC team aware of SNF plan.   Raechel Ache, OTR/L  Rehab Admissions Coordinator  [phone] 10/14/2019 12:35 PM

## 2019-10-15 LAB — GLUCOSE, CAPILLARY
Glucose-Capillary: 121 mg/dL — ABNORMAL HIGH (ref 70–99)
Glucose-Capillary: 144 mg/dL — ABNORMAL HIGH (ref 70–99)
Glucose-Capillary: 163 mg/dL — ABNORMAL HIGH (ref 70–99)
Glucose-Capillary: 169 mg/dL — ABNORMAL HIGH (ref 70–99)
Glucose-Capillary: 182 mg/dL — ABNORMAL HIGH (ref 70–99)
Glucose-Capillary: 222 mg/dL — ABNORMAL HIGH (ref 70–99)

## 2019-10-15 LAB — SARS CORONAVIRUS 2 (TAT 6-24 HRS): SARS Coronavirus 2: NEGATIVE

## 2019-10-15 MED ORDER — OXYCODONE HCL 5 MG PO TABS: 5.0000 mg | ORAL_TABLET | ORAL | 0 refills | Status: DC | PRN

## 2019-10-15 NOTE — Progress Notes (Signed)
Patient is status post left above-knee amputation.  Patient seems sedated this morning however responds to questions.  Plan recommended by therapy is for nursing facility  Vital signs stable a 5 afebrile alert answers questions 0 cc in canister.  Patient understands she is going to a nursing facility until she can manage on her own  May discharge to nursing facility when bed available

## 2019-10-15 NOTE — Consult Note (Addendum)
   [facility] CM Inpatient Consult   10/15/2019  [name] [name] 1975-10-10 [id number]   [name] Patient:  [name] also  Patient was screened for [facility] services. Patient will have the transition of care call conducted by the primary care provider with [facility].   Plan: Notification sent to the [place] [facility] and make aware of patient disposition planned for a skilled nursing facility level of care currently at [facility].  Please contact for further questions,  Natividad Brood, RN BSN [facility] Liaison  [phone] business mobile phone Toll free office [phone]  Fax number: [phone] [email or web address]

## 2019-10-15 NOTE — Progress Notes (Signed)
Physical Therapy Treatment Patient Details Name: [name] [name] MRN: [id number] DOB: [birth year] Today's Date: 10/15/2019    History of Present Illness Patient with left below knee amputation that unfortunately dehisced again. Pt is now s/p LLE knee amputation. Significant PMH of DM2, obesity, and bipolar disorder.     PT Comments    Pt struggling with emotional issues today of further limb loss, chronic illness, and being away from home and family. Support and encouragement given throughout session. Pt performed transfers and short distance ambulation with RW and min A. Pt also performed sitting and standing there ex for B LE's. Discussed differences in use of BKA and AKA prosthesis and advantages that pt has in having strong RLE and BUE's. PT will continue to follow.    Follow Up Recommendations  Supervision for mobility/OOB;SNF     Equipment Recommendations  Hospital bed    Recommendations for Other Services       Precautions / Restrictions Precautions Precautions: Fall Precaution Comments: LLE wound vac Restrictions Weight Bearing Restrictions: Yes LLE Weight Bearing: Non weight bearing Other Position/Activity Restrictions: BKA revision to AKA    Mobility  Bed Mobility Overal bed mobility: Independent             General bed mobility comments: pt sitting EOB  Transfers Overall transfer level: Needs assistance Equipment used: Rolling walker (2 wheeled) Transfers: Sit to/from Stand Sit to Stand: Min guard Stand pivot transfers: Min guard       General transfer comment: pt stood safely from bed and recliner. Min guard A to monitor wound vac line  Ambulation/Gait Ambulation/Gait assistance: Min guard Gait Distance (Feet): 5 Feet Assistive device: Rolling walker (2 wheeled) Gait Pattern/deviations: Step-to pattern Gait velocity: decreased Gait velocity interpretation: <1.31 ft/sec, indicative of household ambulator General Gait Details: pt working on  keeping residual limb down beside L thigh when standing   Stairs             Wheelchair Mobility    Modified Rankin (Stroke Patients Only)       Balance Overall balance assessment: Needs assistance Sitting-balance support: Feet supported Sitting balance-Leahy Scale: Normal     Standing balance support: Bilateral upper extremity supported;During functional activity Standing balance-Leahy Scale: Fair Standing balance comment: supervison-min guard for balance using RW for support                            Cognition Arousal/Alertness: Awake/alert Behavior During Therapy: WFL for tasks assessed/performed Overall Cognitive Status: Within Functional Limits for tasks assessed                                 General Comments: pt highly motivated and cooperative. Concerned today about her 9yo niece that she cares for as well as some other personal issues      Exercises Amputee Exercises Gluteal Sets: AROM;10 reps Hip Extension: AROM;Left;10 reps;Standing Hip ABduction/ADduction: AROM;Left;10 reps;Standing Hip Flexion/Marching: AROM;Left;10 reps;Standing;Seated Straight Leg Raises: AROM;Left;10 reps;Seated Chair Push Up: AROM;10 reps    General Comments General comments (skin integrity, edema, etc.): pt tolerating light touch to residual limb at different places for desensitization and pt performing herself also      Pertinent Vitals/Pain Pain Assessment: Faces Faces Pain Scale: Hurts even more Pain Location: LLE residual limb with movement Pain Descriptors / Indicators: Discomfort;Operative site guarding Pain Intervention(s): Limited activity within patient's tolerance;Monitored during session;Premedicated before session  Home Living                      Prior Function            PT Goals (current goals can now be found in the care plan section) Acute Rehab PT Goals Patient Stated Goal: go to rehab and go home so I can take  care of my family PT Goal Formulation: With patient Time For Goal Achievement: 10/26/19 Potential to Achieve Goals: Good Progress towards PT goals: Progressing toward goals    Frequency    Min 3X/week      PT Plan Current plan remains appropriate    Co-evaluation              AM-PAC PT "6 Clicks" Mobility   Outcome Measure  Help needed turning from your back to your side while in a flat bed without using bedrails?: None Help needed moving from lying on your back to sitting on the side of a flat bed without using bedrails?: None Help needed moving to and from a bed to a chair (including a wheelchair)?: A Little Help needed standing up from a chair using your arms (e.g., wheelchair or bedside chair)?: A Little Help needed to walk in hospital room?: A Little Help needed climbing 3-5 steps with a railing? : A Lot 6 Click Score: 19    End of Session   Activity Tolerance: Patient tolerated treatment well Patient left: in chair;with chair alarm set;with call bell/phone within reach Nurse Communication: Mobility status PT Visit Diagnosis: Other abnormalities of gait and mobility (R26.89);Difficulty in walking, not elsewhere classified (R26.2);Pain;Muscle weakness (generalized) (M62.81) Pain - Right/Left: Left Pain - part of body: Leg     Time: 4709-2957 PT Time Calculation (min) (ACUTE ONLY): 22 min  Charges:  $Therapeutic Exercise: 8-22 mins                     Leighton Roach, PT  Acute Rehab Services  Pager [phone] Office [place] 10/15/2019, 2:46 PM

## 2019-10-15 NOTE — TOC Progression Note (Addendum)
Transition of Care [facility]) - Progression Note    Patient Details  Name: GAYLYNN SEIPLE MRN: [id number] Date of Birth: 26-May-1975  [facility] CM/SW Contact  Jacalyn Lefevre Edson Snowball, RN Phone Number: 10/15/2019, 10:56 AM  Clinical Narrative:     Patient from home with grandmother. PT recommendation SNF. Patient in agreement. Patient has one bed offer [facility]. Provided medicare.gov ratings. Patient accepted bed offer. Called Rosario Adie at [facility]. She will submit for insurance authorization once PT note for today entered ( PT went to room to work with patient, patient was finishing breakfast).  Expected Discharge Plan: Skilled Nursing Facility Barriers to Discharge: Continued Medical Work up, Ship broker  Expected Discharge Plan and Services Expected Discharge Plan: [place] In-house Referral: Clinical Social Work Discharge Planning Services: CM Consult Post Acute Care Choice: [place], [place] arrangements for the past 2 months: Single Family Home                                       Social Determinants of Health (SDOH) Interventions    Readmission Risk Interventions Readmission Risk Prevention Plan 10/14/2019  Transportation Screening Complete  PCP or Specialist Appt within 3-5 Days Not Complete  Not Complete comments plan for rehab  [place] or [place] Complete  Social Work Consult for [place] Planning/Counseling Complete  Palliative Care Screening Not Applicable  Medication Review (RN Care Manager) Referral to Pharmacy  Some recent data might be hidden

## 2019-10-16 LAB — GLUCOSE, CAPILLARY
Glucose-Capillary: 152 mg/dL — ABNORMAL HIGH (ref 70–99)
Glucose-Capillary: 127 mg/dL — ABNORMAL HIGH (ref 70–99)
Glucose-Capillary: 114 mg/dL — ABNORMAL HIGH (ref 70–99)
Glucose-Capillary: 201 mg/dL — ABNORMAL HIGH (ref 70–99)

## 2019-10-16 MED ORDER — OXYCODONE HCL ER 10 MG PO T12A
10.0000 mg | EXTENDED_RELEASE_TABLET | Freq: Two times a day (BID) | ORAL | Status: DC
  Administered 2019-10-16 – 2019-10-17 (×3): 10 mg via ORAL
  Filled 2019-10-16 (×3): qty 1

## 2019-10-16 NOTE — Care Management Important Message (Signed)
Important Message  Patient Details  Name: HAYDE KILGOUR MRN: [id number] Date of Birth: 04-Aug-1975  Judithann Graves Appeal Detailed Notice of Discharge letter created and saved: Yes Detailed Notice of Discharge Document Given to Pateint: Yes [employer] ROI Document Created: Yes [employer] appeal documents uploaded to [employer] stite: Yes   Pegge Cumberledge P Kourtlynn Trevor 10/16/2019, 2:29 PM

## 2019-10-16 NOTE — Progress Notes (Signed)
Patient reports to RN, she is not leaving today. She has appealed her discharge. TOC CM notified. MD. notified

## 2019-10-16 NOTE — Care Management (Signed)
Patient has decided to appeal discharge. HINN letter and detailed discharge letter given .   [name] [name] with [facility] aware. Messaged Maryann Persons PA.   [name]

## 2019-10-16 NOTE — Discharge Summary (Deleted)
Discharge Diagnoses:  Active Problems:   Wound dehiscence   Surgeries: Procedure(s): LEFT ABOVE KNEE AMPUTATION on 10/11/2019    Consultants:   Discharged Condition: Improved  Hospital Course: [name] [name] is an 44 y.o. female who was admitted 10/11/2019 with a chief complaint of Left leg wound Dehiscence, with a final diagnosis of Dehiscence Left Below Knee Amputation.  Patient was brought to the operating room on 10/11/2019 and underwent Procedure(s): LEFT ABOVE KNEE AMPUTATION.    Patient was given perioperative antibiotics:  Anti-infectives (From admission, onward)   Start     Dose/Rate Route Frequency Ordered Stop   10/12/19 1000  terbinafine (LAMISIL) tablet 250 mg  Status:  Discontinued     250 mg Oral Daily 10/11/19 2304 10/11/19 2333   10/11/19 2345  ceFAZolin (ANCEF) IVPB 2g/100 mL premix     2 g 200 mL/hr over 30 Minutes Intravenous Every 6 hours 10/11/19 2304 10/12/19 1150   10/11/19 0800  ceFAZolin (ANCEF) 3 g in dextrose 5 % 50 mL IVPB  Status:  Discontinued     3 g 100 mL/hr over 30 Minutes Intravenous On call to O.R. 10/11/19 0755 10/11/19 0757   10/11/19 0600  clindamycin (CLEOCIN) IVPB 900 mg  Status:  Discontinued     900 mg 100 mL/hr over 30 Minutes Intravenous 30 min pre-op 10/10/19 0807 10/11/19 1352   10/11/19 0600  ceFAZolin (ANCEF) 3 g in dextrose 5 % 50 mL IVPB     3 g 100 mL/hr over 30 Minutes Intravenous On call to O.R. 10/10/19 2376 10/11/19 1016    .  Patient was given sequential compression devices, early ambulation, and aspirin for DVT prophylaxis.  Recent vital signs:  Patient Vitals for the past 24 hrs:  BP Temp Temp src Pulse Resp SpO2  10/16/19 0419 (!) 95/46 98.7 F (37.1 C) Oral 94 16 93 %  10/15/19 2056 114/68 98.6 F (37 C) Oral 94 20 98 %  10/15/19 1434 127/70 97.9 F (36.6 C) Oral 92 18 100 %  .  Recent laboratory studies: No results found.  Discharge Medications:   Allergies as of 10/16/2019      Reactions    Vancomycin Other (See Comments)   Acute renal failure suspected secondary to vanco      Medication List    STOP taking these medications   doxycycline 100 MG tablet Commonly known as: VIBRA-TABS   mupirocin ointment 2 % Commonly known as: BACTROBAN   oxyCODONE-acetaminophen 5-325 MG tablet Commonly known as: PERCOCET/ROXICET     TAKE these medications   allopurinol 100 MG tablet Commonly known as: Zyloprim Take 1 tablet (100 mg total) by mouth 2 (two) times daily. What changed:   when to take this  reasons to take this   amitriptyline 50 MG tablet Commonly known as: ELAVIL Take 1 tablet (50 mg total) by mouth at bedtime.   amLODipine 10 MG tablet Commonly known as: NORVASC Take 1 tablet (10 mg total) by mouth daily.   atorvastatin 20 MG tablet Commonly known as: LIPITOR Take 1 tablet (20 mg total) by mouth daily.   blood glucose meter kit and supplies Kit ICD 10- E11.69. Based on patient and insurance preference. Use up to four times daily as directed.   bumetanide 1 MG tablet Commonly known as: BUMEX TAKE 1 TABLET BY MOUTH 2 TIMES DAILY What changed:   when to take this  additional instructions   Colchicine 0.6 MG Caps Take 0.6 mg by mouth daily. What changed:  when to take this  reasons to take this   cyclobenzaprine 5 MG tablet Commonly known as: FLEXERIL take 1 tablet by mouth daily as needed for muscle spasms What changed: See the new instructions.   diclofenac Sodium 1 % Gel Commonly known as: VOLTAREN Apply 1 application topically 4 (four) times daily as needed (pain).   FLUoxetine 20 MG/5ML solution Commonly known as: PROZAC Take 15 mLs (60 mg total) by mouth daily.   hydrOXYzine 25 MG tablet Commonly known as: ATARAX/VISTARIL TAKE 1 TABLET BY MOUTH EVERY SIX HOURS AS NEEDED FOR ANXIETY What changed: See the new instructions.   lisinopril 10 MG tablet Commonly known as: ZESTRIL Take 1 tablet (10 mg total) by mouth daily.    metoprolol succinate 25 MG 24 hr tablet Commonly known as: TOPROL-XL Take 0.5 tablets (12.5 mg total) by mouth daily.   nystatin powder Commonly known as: MYCOSTATIN/NYSTOP Apply topically 4 times daily. After cleaning and drying the affected area for two weeks What changed: See the new instructions.   oxyCODONE 5 MG immediate release tablet Commonly known as: Oxy IR/ROXICODONE Take 1-2 tablets (5-10 mg total) by mouth every 4 (four) hours as needed for moderate pain (pain score 4-6).   pregabalin 50 MG capsule Commonly known as: LYRICA Take 1 capsule (50 mg total) by mouth 2 (two) times daily. What changed: when to take this   sodium bicarbonate 650 MG tablet Take 1 tablet (650 mg total) by mouth 2 (two) times daily.   sodium zirconium cyclosilicate 10 g Pack packet Commonly known as: LOKELMA Take 10 g by mouth 2 (two) times daily.   tetrahydrozoline 0.05 % ophthalmic solution Place 2 drops into both eyes 2 (two) times daily as needed (red/ dry eyes). Alcon   TRUEplus Pen Needles 31G X 5 MM Misc Generic drug: Insulin Pen Needle Use pen needle with insulin 3 times daily What changed: See the new instructions.   Victoza 18 MG/3ML Sopn Generic drug: liraglutide Inject 0.3 mLs (1.8 mg total) below the skin daily. What changed: See the new instructions.       Diagnostic Studies: DG Knee 2 Views Left  Result Date: 09/18/2019 CLINICAL DATA:  Patient with wound to the amputation site. EXAM: LEFT KNEE - 1-2 VIEW COMPARISON:  None. FINDINGS: Soft tissue overlying the distal aspect of the amputated tibia and fibula contains gas. No evidence for acute fracture or dislocation. Knee joint degenerative changes. Amputation of the distal aspect of the tibia and fibula. IMPRESSION: Gas within the soft tissues overlying the distal aspect of the tibia and fibula most compatible with reported injury. No definite evidence for acute fracture. Electronically Signed   By: Lovey Newcomer M.D.   On:  09/18/2019 18:27    Patient benefited maximally from their hospital stay and there were no complications.     Disposition: Discharge disposition: 03-Skilled Nursing Facility      Discharge Instructions    Call MD / Call 911   Complete by: As directed    If you experience chest pain or shortness of breath, CALL 911 and be transported to the hospital emergency room.  If you develope a fever above 101 F, pus (white drainage) or increased drainage or redness at the wound, or calf pain, call your surgeon's office.   Constipation Prevention   Complete by: As directed    Drink plenty of fluids.  Prune juice may be helpful.  You may use a stool softener, such as Colace (over the counter) 100  mg twice a day.  Use MiraLax (over the counter) for constipation as needed.   Diet - low sodium heart healthy   Complete by: As directed    Discharge instructions   Complete by: As directed    If wound vac fails please contact office for instructions. Patient needs follow up next week for wound vac removel   Increase activity slowly as tolerated   Complete by: As directed    Negative Pressure Wound Therapy - Incisional   Complete by: As directed    Show patient how to attach prevena      Contact information for follow-up providers    Suzan Slick, NP Follow up in 1 week(s).   Specialty: Orthopedic Surgery Contact information: [place] [address] [phone]            Contact information for after-discharge care    [facility] .   Service: Skilled Nursing Contact information: [place]                   Signed: Bevely Palmer Lee Kuang 10/16/2019, 7:36 AM

## 2019-10-16 NOTE — Progress Notes (Addendum)
Patient is status post above-knee amputation.  Appreciate work by transition of care.  Vital signs stable afebrile patient does seem mildly sedated but responds appropriately to questions.  She is in agreement with transition to skilled nursing facility.  She will need follow-up in our office next week for removal of wound VAC.  Patient continues to ask for Dilaudid.  Both yesterday and today on rounds she was comfortable but also somewhat sedated. Nurses noting  This as well associated with hypotension.  Because of this IV Dilaudid will be discontinued.

## 2019-10-16 NOTE — Discharge Summary (Signed)
Discharge Diagnoses:  Active Problems:   Wound dehiscence   Surgeries: Procedure(s): LEFT ABOVE KNEE AMPUTATION on 10/11/2019    Consultants:   Discharged Condition: Improved  Hospital Course: [name] [name] is an 44 y.o. female who was admitted 10/11/2019 with a chief complaint of Left Below Knee Wound dehiscence, with a final diagnosis of Dehiscence Left Below Knee Amputation.  Patient was brought to the operating room on 10/11/2019 and underwent Procedure(s): LEFT ABOVE KNEE AMPUTATION.    Patient was given perioperative antibiotics:  Anti-infectives (From admission, onward)   Start     Dose/Rate Route Frequency Ordered Stop   10/12/19 1000  terbinafine (LAMISIL) tablet 250 mg  Status:  Discontinued     250 mg Oral Daily 10/11/19 2304 10/11/19 2333   10/11/19 2345  ceFAZolin (ANCEF) IVPB 2g/100 mL premix     2 g 200 mL/hr over 30 Minutes Intravenous Every 6 hours 10/11/19 2304 10/12/19 1150   10/11/19 0800  ceFAZolin (ANCEF) 3 g in dextrose 5 % 50 mL IVPB  Status:  Discontinued     3 g 100 mL/hr over 30 Minutes Intravenous On call to O.R. 10/11/19 0755 10/11/19 0757   10/11/19 0600  clindamycin (CLEOCIN) IVPB 900 mg  Status:  Discontinued     900 mg 100 mL/hr over 30 Minutes Intravenous 30 min pre-op 10/10/19 0807 10/11/19 1352   10/11/19 0600  ceFAZolin (ANCEF) 3 g in dextrose 5 % 50 mL IVPB     3 g 100 mL/hr over 30 Minutes Intravenous On call to O.R. 10/10/19 6237 10/11/19 1016    .  Patient was given sequential compression devices, early ambulation, and aspirin for DVT prophylaxis.  Recent vital signs:  Patient Vitals for the past 24 hrs:  BP Temp Temp src Pulse Resp SpO2  10/16/19 0419 (!) 95/46 98.7 F (37.1 C) Oral 94 16 93 %  10/15/19 2056 114/68 98.6 F (37 C) Oral 94 20 98 %  10/15/19 1434 127/70 97.9 F (36.6 C) Oral 92 18 100 %  .  Recent laboratory studies: No results found.  Discharge Medications:   Allergies as of 10/16/2019      Reactions    Vancomycin Other (See Comments)   Acute renal failure suspected secondary to vanco      Medication List    STOP taking these medications   doxycycline 100 MG tablet Commonly known as: VIBRA-TABS   mupirocin ointment 2 % Commonly known as: BACTROBAN   oxyCODONE-acetaminophen 5-325 MG tablet Commonly known as: PERCOCET/ROXICET     TAKE these medications   allopurinol 100 MG tablet Commonly known as: Zyloprim Take 1 tablet (100 mg total) by mouth 2 (two) times daily. What changed:   when to take this  reasons to take this   amitriptyline 50 MG tablet Commonly known as: ELAVIL Take 1 tablet (50 mg total) by mouth at bedtime.   amLODipine 10 MG tablet Commonly known as: NORVASC Take 1 tablet (10 mg total) by mouth daily.   atorvastatin 20 MG tablet Commonly known as: LIPITOR Take 1 tablet (20 mg total) by mouth daily.   blood glucose meter kit and supplies Kit ICD 10- E11.69. Based on patient and insurance preference. Use up to four times daily as directed.   bumetanide 1 MG tablet Commonly known as: BUMEX TAKE 1 TABLET BY MOUTH 2 TIMES DAILY What changed:   when to take this  additional instructions   Colchicine 0.6 MG Caps Take 0.6 mg by mouth daily. What  changed:   when to take this  reasons to take this   cyclobenzaprine 5 MG tablet Commonly known as: FLEXERIL take 1 tablet by mouth daily as needed for muscle spasms What changed: See the new instructions.   diclofenac Sodium 1 % Gel Commonly known as: VOLTAREN Apply 1 application topically 4 (four) times daily as needed (pain).   FLUoxetine 20 MG/5ML solution Commonly known as: PROZAC Take 15 mLs (60 mg total) by mouth daily.   hydrOXYzine 25 MG tablet Commonly known as: ATARAX/VISTARIL TAKE 1 TABLET BY MOUTH EVERY SIX HOURS AS NEEDED FOR ANXIETY What changed: See the new instructions.   lisinopril 10 MG tablet Commonly known as: ZESTRIL Take 1 tablet (10 mg total) by mouth daily.    metoprolol succinate 25 MG 24 hr tablet Commonly known as: TOPROL-XL Take 0.5 tablets (12.5 mg total) by mouth daily.   nystatin powder Commonly known as: MYCOSTATIN/NYSTOP Apply topically 4 times daily. After cleaning and drying the affected area for two weeks What changed: See the new instructions.   oxyCODONE 5 MG immediate release tablet Commonly known as: Oxy IR/ROXICODONE Take 1-2 tablets (5-10 mg total) by mouth every 4 (four) hours as needed for moderate pain (pain score 4-6).   pregabalin 50 MG capsule Commonly known as: LYRICA Take 1 capsule (50 mg total) by mouth 2 (two) times daily. What changed: when to take this   sodium bicarbonate 650 MG tablet Take 1 tablet (650 mg total) by mouth 2 (two) times daily.   sodium zirconium cyclosilicate 10 g Pack packet Commonly known as: LOKELMA Take 10 g by mouth 2 (two) times daily.   tetrahydrozoline 0.05 % ophthalmic solution Place 2 drops into both eyes 2 (two) times daily as needed (red/ dry eyes). Alcon   TRUEplus Pen Needles 31G X 5 MM Misc Generic drug: Insulin Pen Needle Use pen needle with insulin 3 times daily What changed: See the new instructions.   Victoza 18 MG/3ML Sopn Generic drug: liraglutide Inject 0.3 mLs (1.8 mg total) below the skin daily. What changed: See the new instructions.       Diagnostic Studies: DG Knee 2 Views Left  Result Date: 09/18/2019 CLINICAL DATA:  Patient with wound to the amputation site. EXAM: LEFT KNEE - 1-2 VIEW COMPARISON:  None. FINDINGS: Soft tissue overlying the distal aspect of the amputated tibia and fibula contains gas. No evidence for acute fracture or dislocation. Knee joint degenerative changes. Amputation of the distal aspect of the tibia and fibula. IMPRESSION: Gas within the soft tissues overlying the distal aspect of the tibia and fibula most compatible with reported injury. No definite evidence for acute fracture. Electronically Signed   By: Lovey Newcomer M.D.   On:  09/18/2019 18:27    Patient benefited maximally from their hospital stay and there were no complications.     Disposition: Discharge disposition: 03-Skilled Nursing Facility      Discharge Instructions    Call MD / Call 911   Complete by: As directed    If you experience chest pain or shortness of breath, CALL 911 and be transported to the hospital emergency room.  If you develope a fever above 101 F, pus (white drainage) or increased drainage or redness at the wound, or calf pain, call your surgeon's office.   Constipation Prevention   Complete by: As directed    Drink plenty of fluids.  Prune juice may be helpful.  You may use a stool softener, such as Colace (over  the counter) 100 mg twice a day.  Use MiraLax (over the counter) for constipation as needed.   Diet - low sodium heart healthy   Complete by: As directed    Discharge instructions   Complete by: As directed    If wound vac fails please contact office for instructions. Patient needs follow up next week for wound vac removel   Increase activity slowly as tolerated   Complete by: As directed    Negative Pressure Wound Therapy - Incisional   Complete by: As directed    Show patient how to attach prevena      Contact information for follow-up providers    Suzan Slick, NP Follow up in 1 week(s).   Specialty: Orthopedic Surgery Contact information: [place] [address] [phone]            Contact information for after-discharge care    [facility] .   Service: Skilled Nursing Contact information: [place]                   Signed: Bevely Palmer Avarey Yaeger 10/16/2019, 1:46 PM

## 2019-10-16 NOTE — Care Management (Addendum)
[name] [name] at [facility] has received insurance authorization for SNF. Patient can go today.   Nurse to call report to [phone] ask for [facility] hall nurse   Patient will need Prevena VAC pump.   Patient aware of transfer to [facility] today. Patient will call family.   NCM will check with bedside nurse to see what time to arrange [facility] transport.[facility] called for 2 pm   [name] RN

## 2019-10-16 NOTE — Care Management Important Message (Signed)
Important Message  Patient Details  Name: [name] [name] MRN: [id number] Date of Birth: 10/30/1975   Medicare Important Message Given:  Yes     Kelleigh Skerritt Montine Circle 10/16/2019, 2:39 PM

## 2019-10-16 NOTE — Progress Notes (Signed)
Occupational Therapy Treatment Patient Details Name: [name] [name] MRN: [id number] DOB: 04/04/1976 Today's Date: 10/16/2019    History of present illness Patient with left below knee amputation that unfortunately dehisced again. Pt is now s/p LLE knee amputation. Significant PMH of DM2, obesity, and bipolar disorder.    OT comments  Pt sitting up in recliner finishing breakfast upon arrival. Pt agreeable to skilled OT on this date. Pt plans on discharging this afternoon to a SNF before returning home. Assessed dynamic standing balance, grooming, UB/LB dressing, and sponge bathing in seated/standing position. Pt required setup for grooming, dressing, and sponge bathing. Pt able to wash/dry all body parts excluding mid back. Pt required min guard for washing/drying genitals in standing position using RW for support. Pt washed face and brushed teeth while sitting in recliner. Pt donned/doffed hospital gown and RLE sock with supervision for safety. Pt would benefit from continued skilled OT services in a SNF setting. Will continue to follow acutely as able before d/c.    Follow Up Recommendations  Supervision/Assistance - 24 hour;SNF    Equipment Recommendations       Recommendations for Other Services      Precautions / Restrictions Precautions Precautions: Fall Precaution Comments: LLE wound vac Restrictions Weight Bearing Restrictions: Yes LLE Weight Bearing: Non weight bearing       Mobility Bed Mobility               General bed mobility comments: pt sitting in recliner upon arrival  Transfers                      Balance Overall balance assessment: Needs assistance Sitting-balance support: Feet supported Sitting balance-Leahy Scale: Normal Sitting balance - Comments: no issues leaning forward to access LLE in recliner   Standing balance support: Bilateral upper extremity supported;During functional activity Standing balance-Leahy Scale:  Fair Standing balance comment: supervison-min guard for balance using RW for support                           ADL either performed or assessed with clinical judgement   ADL Overall ADL's : Needs assistance/impaired Eating/Feeding: Independent;Sitting   Grooming: Wash/dry hands;Wash/dry face;Oral care;Applying deodorant;Set up;Sitting   Upper Body Bathing: Set up;Sitting   Lower Body Bathing: Sit to/from stand;With adaptive equipment;Min guard(RW for support) Lower Body Bathing Details (indicate cue type and reason): Mod I to wash RLE with extended time and min guard at waistline to maintain balance while performing pericare in standing with RW for support Upper Body Dressing : Set up;Sitting   Lower Body Dressing: Supervision/safety;Sitting/lateral leans Lower Body Dressing Details (indicate cue type and reason): able to bend down to access sock             Functional mobility during ADLs: Supervision/safety;Min guard;Rolling walker General ADL Comments: No LOB; pt comfortable standing on LLE to wash genitals in standing using RW for support     Vision       Perception     Praxis      Cognition Arousal/Alertness: Awake/alert Behavior During Therapy: WFL for tasks assessed/performed Overall Cognitive Status: Within Functional Limits for tasks assessed                                 General Comments: pt highly motivated and cooperative. Concerned today about her 9yo niece that she cares for as well  as some other personal issues but willing to try her best in rehab.        Exercises     Shoulder Instructions       General Comments educated pt on self-advocacy; pt receptive    Pertinent Vitals/ Pain       Pain Assessment: No/denies pain  Home Living                                          Prior Functioning/Environment              Frequency  Min 2X/week        Progress Toward Goals  OT Goals(current  goals can now be found in the care plan section)  Progress towards OT goals: Progressing toward goals  Acute Rehab OT Goals Patient Stated Goal: go to rehab and go home so I can take care of my family  Plan Discharge plan remains appropriate    Co-evaluation                 AM-PAC OT "6 Clicks" Daily Activity     Outcome Measure   Help from another person eating meals?: None Help from another person taking care of personal grooming?: None Help from another person toileting, which includes using toliet, bedpan, or urinal?: A Little Help from another person bathing (including washing, rinsing, drying)?: A Little Help from another person to put on and taking off regular upper body clothing?: None Help from another person to put on and taking off regular lower body clothing?: A Little 6 Click Score: 21    End of Session Equipment Utilized During Treatment: Gait belt;Rolling walker  OT Visit Diagnosis: Unsteadiness on feet (R26.81);Pain;History of falling (Z91.81)   Activity Tolerance Patient tolerated treatment well   Patient Left in chair;with call bell/phone within reach;with chair alarm set   Nurse Communication Mobility status        Time: 6440-3474 OT Time Calculation (min): 36 min  Charges: OT General Charges $OT Visit: 1 Visit OT Treatments $Self Care/Home Management : 23-37 mins  [name], OTR/L [facility] [phone]   [name] 10/16/2019, 11:04 AM

## 2019-10-17 LAB — GLUCOSE, CAPILLARY
Glucose-Capillary: 112 mg/dL — ABNORMAL HIGH (ref 70–99)
Glucose-Capillary: 146 mg/dL — ABNORMAL HIGH (ref 70–99)
Glucose-Capillary: 147 mg/dL — ABNORMAL HIGH (ref 70–99)
Glucose-Capillary: 154 mg/dL — ABNORMAL HIGH (ref 70–99)
Glucose-Capillary: 165 mg/dL — ABNORMAL HIGH (ref 70–99)
Glucose-Capillary: 167 mg/dL — ABNORMAL HIGH (ref 70–99)

## 2019-10-17 NOTE — Progress Notes (Signed)
Patient is sitting up more alert today.  Has good pain control.  Blood pressure improved since discontinue IV Dilaudid.  Patient refused nursing home bed because of its reviews.  She is now wondering why she cannot go to inpatient rehab  Vital signs stable afebrile sitting up and alert.  Wound VAC is working with 0 cc in the canister.   Patient has appealed her discharge to Medicare.  We are awaiting disposition on this.  Wondering if she could be reevaluated for possible transfer to inpatient rehab.  She seems to have made some progress with PT OT in the last couple days.  She thinks that she would get much better care here than if she went out to a nursing facility

## 2019-10-17 NOTE — Care Management (Addendum)
Do not have determination back from patient's appeal. Spoke with patient. If determination comes back that patient is medically stable for discharge. Patient still interested in going to [facility]. Will check bed availably with [facility] at that time.   1555 update Per Kenney Houseman at [facility] patient lost appeal, they tried to call her in the room and no answer, they called her cell phone and left message. NCM went to patient's room. Patient asleep after multiple times of calling patient's name she woke up.   Above explained to patient. Patient agreeable to go to [facility]. Patient wants to speak to someone at [facility] first.  Vonzella Nipple at [facility], awaiting call back. Asked if patient needs a new covid. Awaiting call back.     [name] RN

## 2019-10-17 NOTE — Social Work (Signed)
CSW acknowledging inquiry about CIR, pt doesn't have needed support at home post discharge per admissions coordinator note. Of note pt was also previously denied for CIR by her insurance.   [name] [name], MSW, [place] Work

## 2019-10-17 NOTE — Progress Notes (Signed)
Nutrition Follow-up  DOCUMENTATION CODES:   Morbid obesity  INTERVENTION:   -Continue MVI with minerals daily -Continue Ensure Max po daily, each supplement provides 150 kcal and 30 grams of protein -Continue 1 packet Juven BID, each packet provides 95 calories, 2.5 grams of protein (collagen), and 9.8 grams of carbohydrate (3 grams sugar); also contains 7 grams of L-arginine and L-glutamine, 300 mg vitamin C, 15 mg vitamin E, 1.2 mcg vitamin B-12, 9.5 mg zinc, 200 mg calcium, and 1.5 g  Calcium Beta-hydroxy-Beta-methylbutyrate to support wound healing -Continue double protein portions with meals  NUTRITION DIAGNOSIS:   Increased nutrient needs related to post-op healing, wound healing as evidenced by estimated needs.  Ongoing  GOAL:   Patient will meet greater than or equal to 90% of their needs  Progressing   MONITOR:   PO intake, Supplement acceptance, Labs, Weight trends, Skin, I & O's  REASON FOR ASSESSMENT:   Consult Assessment of nutrition requirement/status  ASSESSMENT:   [name] [name] is a  44 y.o. female with a diagnosis of Dehiscence Left Below Knee Amputation who failed conservative measures and elected for surgical management.  5/21- s/p PROCEDURE: LEFT ABOVE KNEE AMPUTATION Application of Prevena Ortho form and customizable wound VAC sponge  Reviewed I/O'sL +1.1 L x 24 hours and +5.1 L since admission  Case discussed with PT, who confirms pt making very good progress with rehab. Discharge held yesterday, as pt appealed discharge. Per MD notes, pt desiring CIR placement.  Pt remains with good appetite, noted meal completion 100%. Observed meal tray- pt consumed all expect for sausage patty. She has been taking Ensure Max and Juven supplements.  Pt resting quietly at time of visit. She did not respond to voice.  Labs reviewed: CBGS: 114-201 (inpatient orders for glycemic control are 0-15 units insulin aspart TID with meals and 1.8 mg liraglutide  daily).   NUTRITION - FOCUSED PHYSICAL EXAM:    Most Recent Value  Orbital Region  No depletion  Upper Arm Region  No depletion  Thoracic and Lumbar Region  No depletion  Buccal Region  No depletion  Temple Region  No depletion  Clavicle Bone Region  No depletion  Clavicle and Acromion Bone Region  No depletion  Scapular Bone Region  No depletion  Dorsal Hand  No depletion  Patellar Region  No depletion  Anterior Thigh Region  No depletion  Posterior Calf Region  No depletion  Edema (RD Assessment)  Mild  Hair  Reviewed  Eyes  Reviewed  Mouth  Reviewed  Skin  Reviewed  Nails  Reviewed       Diet Order:   Diet Order            Diet - low sodium heart healthy        Diet Carb Modified Fluid consistency: Thin; Room service appropriate? Yes  Diet effective now              EDUCATION NEEDS:   No education needs have been identified at this time  Skin:  Skin Assessment: Skin Integrity Issues: Skin Integrity Issues:: Wound VAC Wound Vac: lt leg s/p AKA  Last BM:  10/17/19  Height:   Ht Readings from Last 1 Encounters:  10/11/19 5' 9.5" (1.765 m)    Weight:   Wt Readings from Last 1 Encounters:  10/11/19 127 kg    Ideal Body Weight:  61.7 kg(adjusted for lt AKA)  BMI:  Body mass index is 40.76 kg/m.  Estimated Nutritional Needs:  Kcal:  1950-2150  Protein:  120-135 grams  Fluid:  > 1.9 L    Loistine Chance, RD, LDN, [place] Registered Dietitian II Certified Diabetes Care and Education Specialist Please refer to [facility] for RD and/or RD on-call/weekend/after hours pager

## 2019-10-18 DIAGNOSIS — N189 Chronic kidney disease, unspecified: Secondary | ICD-10-CM | POA: Diagnosis not present

## 2019-10-18 DIAGNOSIS — I5033 Acute on chronic diastolic (congestive) heart failure: Secondary | ICD-10-CM | POA: Diagnosis not present

## 2019-10-18 DIAGNOSIS — N183 Chronic kidney disease, stage 3 unspecified: Secondary | ICD-10-CM | POA: Diagnosis not present

## 2019-10-18 DIAGNOSIS — R7889 Finding of other specified substances, not normally found in blood: Secondary | ICD-10-CM | POA: Diagnosis not present

## 2019-10-18 DIAGNOSIS — Z79899 Other long term (current) drug therapy: Secondary | ICD-10-CM | POA: Diagnosis not present

## 2019-10-18 DIAGNOSIS — E875 Hyperkalemia: Secondary | ICD-10-CM | POA: Diagnosis not present

## 2019-10-18 DIAGNOSIS — E119 Type 2 diabetes mellitus without complications: Secondary | ICD-10-CM | POA: Diagnosis not present

## 2019-10-18 DIAGNOSIS — R0902 Hypoxemia: Secondary | ICD-10-CM | POA: Diagnosis not present

## 2019-10-18 DIAGNOSIS — E43 Unspecified severe protein-calorie malnutrition: Secondary | ICD-10-CM | POA: Diagnosis not present

## 2019-10-18 DIAGNOSIS — R52 Pain, unspecified: Secondary | ICD-10-CM | POA: Diagnosis not present

## 2019-10-18 DIAGNOSIS — K219 Gastro-esophageal reflux disease without esophagitis: Secondary | ICD-10-CM | POA: Diagnosis present

## 2019-10-18 DIAGNOSIS — E785 Hyperlipidemia, unspecified: Secondary | ICD-10-CM | POA: Diagnosis present

## 2019-10-18 DIAGNOSIS — M25512 Pain in left shoulder: Secondary | ICD-10-CM | POA: Diagnosis not present

## 2019-10-18 DIAGNOSIS — E1142 Type 2 diabetes mellitus with diabetic polyneuropathy: Secondary | ICD-10-CM | POA: Diagnosis present

## 2019-10-18 DIAGNOSIS — L089 Local infection of the skin and subcutaneous tissue, unspecified: Secondary | ICD-10-CM | POA: Diagnosis not present

## 2019-10-18 DIAGNOSIS — Z89612 Acquired absence of left leg above knee: Secondary | ICD-10-CM | POA: Diagnosis not present

## 2019-10-18 DIAGNOSIS — J9601 Acute respiratory failure with hypoxia: Secondary | ICD-10-CM | POA: Diagnosis not present

## 2019-10-18 DIAGNOSIS — R05 Cough: Secondary | ICD-10-CM | POA: Diagnosis not present

## 2019-10-18 DIAGNOSIS — R5381 Other malaise: Secondary | ICD-10-CM | POA: Diagnosis not present

## 2019-10-18 DIAGNOSIS — D62 Acute posthemorrhagic anemia: Secondary | ICD-10-CM | POA: Diagnosis not present

## 2019-10-18 DIAGNOSIS — J189 Pneumonia, unspecified organism: Secondary | ICD-10-CM | POA: Diagnosis not present

## 2019-10-18 DIAGNOSIS — I130 Hypertensive heart and chronic kidney disease with heart failure and stage 1 through stage 4 chronic kidney disease, or unspecified chronic kidney disease: Secondary | ICD-10-CM | POA: Diagnosis not present

## 2019-10-18 DIAGNOSIS — Z6841 Body Mass Index (BMI) 40.0 and over, adult: Secondary | ICD-10-CM | POA: Diagnosis not present

## 2019-10-18 DIAGNOSIS — Z881 Allergy status to other antibiotic agents status: Secondary | ICD-10-CM | POA: Diagnosis not present

## 2019-10-18 DIAGNOSIS — Z20822 Contact with and (suspected) exposure to covid-19: Secondary | ICD-10-CM | POA: Diagnosis not present

## 2019-10-18 DIAGNOSIS — R590 Localized enlarged lymph nodes: Secondary | ICD-10-CM | POA: Diagnosis present

## 2019-10-18 DIAGNOSIS — Z7401 Bed confinement status: Secondary | ICD-10-CM | POA: Diagnosis not present

## 2019-10-18 DIAGNOSIS — D631 Anemia in chronic kidney disease: Secondary | ICD-10-CM | POA: Diagnosis present

## 2019-10-18 DIAGNOSIS — Z87891 Personal history of nicotine dependence: Secondary | ICD-10-CM | POA: Diagnosis not present

## 2019-10-18 DIAGNOSIS — M2550 Pain in unspecified joint: Secondary | ICD-10-CM | POA: Diagnosis not present

## 2019-10-18 DIAGNOSIS — N1832 Chronic kidney disease, stage 3b: Secondary | ICD-10-CM | POA: Diagnosis present

## 2019-10-18 DIAGNOSIS — E1159 Type 2 diabetes mellitus with other circulatory complications: Secondary | ICD-10-CM | POA: Diagnosis not present

## 2019-10-18 DIAGNOSIS — Y95 Nosocomial condition: Secondary | ICD-10-CM | POA: Diagnosis present

## 2019-10-18 DIAGNOSIS — E039 Hypothyroidism, unspecified: Secondary | ICD-10-CM | POA: Diagnosis present

## 2019-10-18 DIAGNOSIS — I10 Essential (primary) hypertension: Secondary | ICD-10-CM | POA: Diagnosis not present

## 2019-10-18 DIAGNOSIS — E1122 Type 2 diabetes mellitus with diabetic chronic kidney disease: Secondary | ICD-10-CM | POA: Diagnosis present

## 2019-10-18 DIAGNOSIS — R1084 Generalized abdominal pain: Secondary | ICD-10-CM | POA: Diagnosis not present

## 2019-10-18 DIAGNOSIS — N179 Acute kidney failure, unspecified: Secondary | ICD-10-CM | POA: Diagnosis not present

## 2019-10-18 DIAGNOSIS — D649 Anemia, unspecified: Secondary | ICD-10-CM | POA: Diagnosis not present

## 2019-10-18 DIAGNOSIS — F319 Bipolar disorder, unspecified: Secondary | ICD-10-CM | POA: Diagnosis not present

## 2019-10-18 DIAGNOSIS — G4733 Obstructive sleep apnea (adult) (pediatric): Secondary | ICD-10-CM | POA: Diagnosis not present

## 2019-10-18 DIAGNOSIS — E871 Hypo-osmolality and hyponatremia: Secondary | ICD-10-CM | POA: Diagnosis not present

## 2019-10-18 LAB — GLUCOSE, CAPILLARY
Glucose-Capillary: 105 mg/dL — ABNORMAL HIGH (ref 70–99)
Glucose-Capillary: 127 mg/dL — ABNORMAL HIGH (ref 70–99)

## 2019-10-18 NOTE — Progress Notes (Addendum)
Report called to Mendel Corning, [place], [place]. Patient will be transferred via [place].   1147 [facility] at bedside to transfer patient to [facility].

## 2019-10-18 NOTE — Discharge Summary (Signed)
Discharge Diagnoses:  Active Problems:   Wound dehiscence   Surgeries: Procedure(s): LEFT ABOVE KNEE AMPUTATION on 10/11/2019    Consultants:   Discharged Condition: Improved  Hospital Course: [name] [name] is an 44 y.o. female who was admitted 10/11/2019 with a chief complaint of Left Wound dehiscence, with a final diagnosis of Dehiscence Left Below Knee Amputation.  Patient was brought to the operating room on 10/11/2019 and underwent Procedure(s): LEFT ABOVE KNEE AMPUTATION.    Patient was given perioperative antibiotics:  Anti-infectives (From admission, onward)   Start     Dose/Rate Route Frequency Ordered Stop   10/12/19 1000  terbinafine (LAMISIL) tablet 250 mg  Status:  Discontinued     250 mg Oral Daily 10/11/19 2304 10/11/19 2333   10/11/19 2345  ceFAZolin (ANCEF) IVPB 2g/100 mL premix     2 g 200 mL/hr over 30 Minutes Intravenous Every 6 hours 10/11/19 2304 10/12/19 1150   10/11/19 0800  ceFAZolin (ANCEF) 3 g in dextrose 5 % 50 mL IVPB  Status:  Discontinued     3 g 100 mL/hr over 30 Minutes Intravenous On call to O.R. 10/11/19 0755 10/11/19 0757   10/11/19 0600  clindamycin (CLEOCIN) IVPB 900 mg  Status:  Discontinued     900 mg 100 mL/hr over 30 Minutes Intravenous 30 min pre-op 10/10/19 0807 10/11/19 1352   10/11/19 0600  ceFAZolin (ANCEF) 3 g in dextrose 5 % 50 mL IVPB     3 g 100 mL/hr over 30 Minutes Intravenous On call to O.R. 10/10/19 3428 10/11/19 1016    .  Patient was given sequential compression devices, early ambulation, and aspirin for DVT prophylaxis.  Recent vital signs:  Patient Vitals for the past 24 hrs:  BP Temp Temp src Pulse Resp SpO2  10/18/19 0411 102/61 97.6 F (36.4 C) -- 92 17 100 %  10/17/19 1945 115/62 98.7 F (37.1 C) Oral 94 17 99 %  10/17/19 1100 117/76 -- -- 93 -- --  .  Recent laboratory studies: No results found.  Discharge Medications:   Allergies as of 10/18/2019      Reactions   Vancomycin Other (See Comments)    Acute renal failure suspected secondary to vanco      Medication List    STOP taking these medications   doxycycline 100 MG tablet Commonly known as: VIBRA-TABS   mupirocin ointment 2 % Commonly known as: BACTROBAN   oxyCODONE-acetaminophen 5-325 MG tablet Commonly known as: PERCOCET/ROXICET     TAKE these medications   allopurinol 100 MG tablet Commonly known as: Zyloprim Take 1 tablet (100 mg total) by mouth 2 (two) times daily. What changed:   when to take this  reasons to take this   amitriptyline 50 MG tablet Commonly known as: ELAVIL Take 1 tablet (50 mg total) by mouth at bedtime.   amLODipine 10 MG tablet Commonly known as: NORVASC Take 1 tablet (10 mg total) by mouth daily.   atorvastatin 20 MG tablet Commonly known as: LIPITOR Take 1 tablet (20 mg total) by mouth daily.   blood glucose meter kit and supplies Kit ICD 10- E11.69. Based on patient and insurance preference. Use up to four times daily as directed.   bumetanide 1 MG tablet Commonly known as: BUMEX TAKE 1 TABLET BY MOUTH 2 TIMES DAILY What changed:   when to take this  additional instructions   Colchicine 0.6 MG Caps Take 0.6 mg by mouth daily. What changed:   when to take this  reasons to take this   cyclobenzaprine 5 MG tablet Commonly known as: FLEXERIL take 1 tablet by mouth daily as needed for muscle spasms What changed: See the new instructions.   diclofenac Sodium 1 % Gel Commonly known as: VOLTAREN Apply 1 application topically 4 (four) times daily as needed (pain).   FLUoxetine 20 MG/5ML solution Commonly known as: PROZAC Take 15 mLs (60 mg total) by mouth daily.   hydrOXYzine 25 MG tablet Commonly known as: ATARAX/VISTARIL TAKE 1 TABLET BY MOUTH EVERY SIX HOURS AS NEEDED FOR ANXIETY What changed: See the new instructions.   lisinopril 10 MG tablet Commonly known as: ZESTRIL Take 1 tablet (10 mg total) by mouth daily.   metoprolol succinate 25 MG 24 hr  tablet Commonly known as: TOPROL-XL Take 0.5 tablets (12.5 mg total) by mouth daily.   nystatin powder Commonly known as: MYCOSTATIN/NYSTOP Apply topically 4 times daily. After cleaning and drying the affected area for two weeks What changed: See the new instructions.   oxyCODONE 5 MG immediate release tablet Commonly known as: Oxy IR/ROXICODONE Take 1-2 tablets (5-10 mg total) by mouth every 4 (four) hours as needed for moderate pain (pain score 4-6).   pregabalin 50 MG capsule Commonly known as: LYRICA Take 1 capsule (50 mg total) by mouth 2 (two) times daily. What changed: when to take this   sodium bicarbonate 650 MG tablet Take 1 tablet (650 mg total) by mouth 2 (two) times daily.   sodium zirconium cyclosilicate 10 g Pack packet Commonly known as: LOKELMA Take 10 g by mouth 2 (two) times daily.   tetrahydrozoline 0.05 % ophthalmic solution Place 2 drops into both eyes 2 (two) times daily as needed (red/ dry eyes). Alcon   TRUEplus Pen Needles 31G X 5 MM Misc Generic drug: Insulin Pen Needle Use pen needle with insulin 3 times daily What changed: See the new instructions.   Victoza 18 MG/3ML Sopn Generic drug: liraglutide Inject 0.3 mLs (1.8 mg total) below the skin daily. What changed: See the new instructions.       Diagnostic Studies: DG Knee 2 Views Left  Result Date: 09/18/2019 CLINICAL DATA:  Patient with wound to the amputation site. EXAM: LEFT KNEE - 1-2 VIEW COMPARISON:  None. FINDINGS: Soft tissue overlying the distal aspect of the amputated tibia and fibula contains gas. No evidence for acute fracture or dislocation. Knee joint degenerative changes. Amputation of the distal aspect of the tibia and fibula. IMPRESSION: Gas within the soft tissues overlying the distal aspect of the tibia and fibula most compatible with reported injury. No definite evidence for acute fracture. Electronically Signed   By: Lovey Newcomer M.D.   On: 09/18/2019 18:27    Patient  benefited maximally from their hospital stay and there were no complications.     Disposition: Discharge disposition: 03-Skilled Nursing Facility      Discharge Instructions    Call MD / Call 911   Complete by: As directed    If you experience chest pain or shortness of breath, CALL 911 and be transported to the hospital emergency room.  If you develope a fever above 101 F, pus (white drainage) or increased drainage or redness at the wound, or calf pain, call your surgeon's office.   Call MD / Call 911   Complete by: As directed    If you experience chest pain or shortness of breath, CALL 911 and be transported to the hospital emergency room.  If you develope a fever above  61 F, pus (white drainage) or increased drainage or redness at the wound, or calf pain, call your surgeon's office.   Constipation Prevention   Complete by: As directed    Drink plenty of fluids.  Prune juice may be helpful.  You may use a stool softener, such as Colace (over the counter) 100 mg twice a day.  Use MiraLax (over the counter) for constipation as needed.   Constipation Prevention   Complete by: As directed    Drink plenty of fluids.  Prune juice may be helpful.  You may use a stool softener, such as Colace (over the counter) 100 mg twice a day.  Use MiraLax (over the counter) for constipation as needed.   Diet - low sodium heart healthy   Complete by: As directed    Diet - low sodium heart healthy   Complete by: As directed    Discharge instructions   Complete by: As directed    If wound vac fails please contact office for instructions. Patient needs follow up next week for wound vac removel   Increase activity slowly as tolerated   Complete by: As directed    Increase activity slowly as tolerated   Complete by: As directed       Contact information for follow-up providers    Suzan Slick, NP Follow up in 1 week(s).   Specialty: Orthopedic Surgery Contact information: [place] [phone]            Contact information for after-discharge care    [facility] .   Service: Skilled Nursing Contact information: [address] [place]                   Signed: Bevely Palmer Khoi Hamberger 10/18/2019, 8:01 AM

## 2019-10-18 NOTE — Care Management (Signed)
Patient agreeable for discharge to [facility] today.   Nurse to call report to [phone]. And ask for [employer].   [facility] called for transport.    [name] RN

## 2019-10-18 NOTE — Progress Notes (Addendum)
Patient is 1 week status post left above-knee amputation.  She is much more alert today.  She is agreeable to going to skilled nursing  Vital signs stable afebrile wound VAC was removed today it has 0 in the canister incision is well opposed minimal drainage swelling well controlled no necrosis no foul odor dry dressing was applied  Plan for discharge to skilled nursing today follow-up in office in 1 week  Per nursing patient has been getting OxyContin 10 mg every 12 hours.  Nursing states she is quite sedated after this.  OxyContin has been discontinued

## 2019-10-21 ENCOUNTER — Inpatient Hospital Stay (HOSPITAL_COMMUNITY)
Admission: EM | Admit: 2019-10-21 | Discharge: 2019-10-31 | DRG: 189 | Disposition: A | Discharge status: Home Health | Payer: Medicare HMO | Source: Skilled Nursing Facility | Attending: Internal Medicine | Admitting: Internal Medicine

## 2019-10-21 ENCOUNTER — Emergency Department (HOSPITAL_COMMUNITY): Payer: Medicare HMO

## 2019-10-21 ENCOUNTER — Other Ambulatory Visit: Payer: Self-pay

## 2019-10-21 ENCOUNTER — Encounter (HOSPITAL_COMMUNITY): Payer: Self-pay

## 2019-10-21 DIAGNOSIS — I130 Hypertensive heart and chronic kidney disease with heart failure and stage 1 through stage 4 chronic kidney disease, or unspecified chronic kidney disease: Secondary | ICD-10-CM | POA: Diagnosis present

## 2019-10-21 DIAGNOSIS — D62 Acute posthemorrhagic anemia: Secondary | ICD-10-CM | POA: Diagnosis not present

## 2019-10-21 DIAGNOSIS — Z87891 Personal history of nicotine dependence: Secondary | ICD-10-CM | POA: Diagnosis not present

## 2019-10-21 DIAGNOSIS — D649 Anemia, unspecified: Secondary | ICD-10-CM | POA: Diagnosis present

## 2019-10-21 DIAGNOSIS — N179 Acute kidney failure, unspecified: Secondary | ICD-10-CM | POA: Diagnosis present

## 2019-10-21 DIAGNOSIS — Z881 Allergy status to other antibiotic agents status: Secondary | ICD-10-CM

## 2019-10-21 DIAGNOSIS — Z79899 Other long term (current) drug therapy: Secondary | ICD-10-CM | POA: Diagnosis not present

## 2019-10-21 DIAGNOSIS — L03116 Cellulitis of left lower limb: Secondary | ICD-10-CM | POA: Diagnosis not present

## 2019-10-21 DIAGNOSIS — I5030 Unspecified diastolic (congestive) heart failure: Secondary | ICD-10-CM | POA: Diagnosis present

## 2019-10-21 DIAGNOSIS — E1151 Type 2 diabetes mellitus with diabetic peripheral angiopathy without gangrene: Secondary | ICD-10-CM

## 2019-10-21 DIAGNOSIS — N189 Chronic kidney disease, unspecified: Secondary | ICD-10-CM

## 2019-10-21 DIAGNOSIS — E119 Type 2 diabetes mellitus without complications: Secondary | ICD-10-CM

## 2019-10-21 DIAGNOSIS — K219 Gastro-esophageal reflux disease without esophagitis: Secondary | ICD-10-CM | POA: Diagnosis present

## 2019-10-21 DIAGNOSIS — J9601 Acute respiratory failure with hypoxia: Secondary | ICD-10-CM | POA: Diagnosis present

## 2019-10-21 DIAGNOSIS — Z841 Family history of disorders of kidney and ureter: Secondary | ICD-10-CM

## 2019-10-21 DIAGNOSIS — G4733 Obstructive sleep apnea (adult) (pediatric): Secondary | ICD-10-CM | POA: Diagnosis present

## 2019-10-21 DIAGNOSIS — Z833 Family history of diabetes mellitus: Secondary | ICD-10-CM

## 2019-10-21 DIAGNOSIS — I5033 Acute on chronic diastolic (congestive) heart failure: Secondary | ICD-10-CM | POA: Diagnosis not present

## 2019-10-21 DIAGNOSIS — N1832 Chronic kidney disease, stage 3b: Secondary | ICD-10-CM | POA: Diagnosis present

## 2019-10-21 DIAGNOSIS — E039 Hypothyroidism, unspecified: Secondary | ICD-10-CM | POA: Diagnosis present

## 2019-10-21 DIAGNOSIS — Z89612 Acquired absence of left leg above knee: Secondary | ICD-10-CM

## 2019-10-21 DIAGNOSIS — E785 Hyperlipidemia, unspecified: Secondary | ICD-10-CM | POA: Diagnosis present

## 2019-10-21 DIAGNOSIS — J189 Pneumonia, unspecified organism: Secondary | ICD-10-CM | POA: Diagnosis not present

## 2019-10-21 DIAGNOSIS — Z8249 Family history of ischemic heart disease and other diseases of the circulatory system: Secondary | ICD-10-CM

## 2019-10-21 DIAGNOSIS — Z20822 Contact with and (suspected) exposure to covid-19: Secondary | ICD-10-CM | POA: Diagnosis present

## 2019-10-21 DIAGNOSIS — E871 Hypo-osmolality and hyponatremia: Secondary | ICD-10-CM | POA: Diagnosis present

## 2019-10-21 DIAGNOSIS — R52 Pain, unspecified: Secondary | ICD-10-CM | POA: Diagnosis not present

## 2019-10-21 DIAGNOSIS — A419 Sepsis, unspecified organism: Secondary | ICD-10-CM | POA: Diagnosis not present

## 2019-10-21 DIAGNOSIS — E1122 Type 2 diabetes mellitus with diabetic chronic kidney disease: Secondary | ICD-10-CM | POA: Diagnosis present

## 2019-10-21 DIAGNOSIS — E875 Hyperkalemia: Secondary | ICD-10-CM | POA: Diagnosis not present

## 2019-10-21 DIAGNOSIS — M25512 Pain in left shoulder: Secondary | ICD-10-CM | POA: Diagnosis not present

## 2019-10-21 DIAGNOSIS — R05 Cough: Secondary | ICD-10-CM | POA: Diagnosis not present

## 2019-10-21 DIAGNOSIS — R1900 Intra-abdominal and pelvic swelling, mass and lump, unspecified site: Secondary | ICD-10-CM | POA: Diagnosis not present

## 2019-10-21 DIAGNOSIS — D631 Anemia in chronic kidney disease: Secondary | ICD-10-CM | POA: Diagnosis present

## 2019-10-21 DIAGNOSIS — S88112D Complete traumatic amputation at level between knee and ankle, left lower leg, subsequent encounter: Secondary | ICD-10-CM | POA: Diagnosis not present

## 2019-10-21 DIAGNOSIS — R0902 Hypoxemia: Secondary | ICD-10-CM

## 2019-10-21 DIAGNOSIS — I10 Essential (primary) hypertension: Secondary | ICD-10-CM | POA: Diagnosis not present

## 2019-10-21 DIAGNOSIS — Z6841 Body Mass Index (BMI) 40.0 and over, adult: Secondary | ICD-10-CM | POA: Diagnosis not present

## 2019-10-21 DIAGNOSIS — R590 Localized enlarged lymph nodes: Secondary | ICD-10-CM | POA: Diagnosis present

## 2019-10-21 DIAGNOSIS — Z89512 Acquired absence of left leg below knee: Secondary | ICD-10-CM | POA: Diagnosis not present

## 2019-10-21 DIAGNOSIS — R1084 Generalized abdominal pain: Secondary | ICD-10-CM | POA: Diagnosis not present

## 2019-10-21 DIAGNOSIS — E1142 Type 2 diabetes mellitus with diabetic polyneuropathy: Secondary | ICD-10-CM | POA: Diagnosis present

## 2019-10-21 DIAGNOSIS — R6520 Severe sepsis without septic shock: Secondary | ICD-10-CM | POA: Diagnosis not present

## 2019-10-21 DIAGNOSIS — Y95 Nosocomial condition: Secondary | ICD-10-CM | POA: Diagnosis present

## 2019-10-21 DIAGNOSIS — R2242 Localized swelling, mass and lump, left lower limb: Secondary | ICD-10-CM | POA: Diagnosis not present

## 2019-10-21 DIAGNOSIS — R7889 Finding of other specified substances, not normally found in blood: Secondary | ICD-10-CM | POA: Diagnosis not present

## 2019-10-21 DIAGNOSIS — F319 Bipolar disorder, unspecified: Secondary | ICD-10-CM | POA: Diagnosis present

## 2019-10-21 DIAGNOSIS — E1159 Type 2 diabetes mellitus with other circulatory complications: Secondary | ICD-10-CM | POA: Diagnosis not present

## 2019-10-21 DIAGNOSIS — M109 Gout, unspecified: Secondary | ICD-10-CM | POA: Diagnosis present

## 2019-10-21 HISTORY — DX: Anemia, unspecified: D64.9

## 2019-10-21 HISTORY — DX: Acute respiratory failure with hypoxia: J96.01

## 2019-10-21 LAB — BASIC METABOLIC PANEL
Anion gap: 10 (ref 5–15)
BUN: 85 mg/dL — ABNORMAL HIGH (ref 6–20)
CO2: 23 mmol/L (ref 22–32)
Calcium: 8.5 mg/dL — ABNORMAL LOW (ref 8.9–10.3)
Chloride: 100 mmol/L (ref 98–111)
Creatinine, Ser: 2.69 mg/dL — ABNORMAL HIGH (ref 0.44–1.00)
GFR calc Af Amer: 24 mL/min — ABNORMAL LOW (ref >60)
GFR calc non Af Amer: 21 mL/min — ABNORMAL LOW (ref >60)
Glucose, Bld: 113 mg/dL — ABNORMAL HIGH (ref 70–99)
Potassium: 5.5 mmol/L — ABNORMAL HIGH (ref 3.5–5.1)
Sodium: 133 mmol/L — ABNORMAL LOW (ref 135–145)

## 2019-10-21 LAB — CBC WITH DIFFERENTIAL/PLATELET
Abs Immature Granulocytes: 0.06 10*3/uL (ref 0.00–0.07)
Basophils Absolute: 0 10*3/uL (ref 0.0–0.1)
Basophils Relative: 0 %
Eosinophils Absolute: 0.2 10*3/uL (ref 0.0–0.5)
Eosinophils Relative: 3 %
HCT: 18.8 % — ABNORMAL LOW (ref 36.0–46.0)
Hemoglobin: 5.7 g/dL — CL (ref 12.0–15.0)
Immature Granulocytes: 1 %
Lymphocytes Relative: 21 %
Lymphs Abs: 1.4 10*3/uL (ref 0.7–4.0)
MCH: 25.3 pg — ABNORMAL LOW (ref 26.0–34.0)
MCHC: 30.3 g/dL (ref 30.0–36.0)
MCV: 83.6 fL (ref 80.0–100.0)
Monocytes Absolute: 0.7 10*3/uL (ref 0.1–1.0)
Monocytes Relative: 11 %
Neutro Abs: 4.2 10*3/uL (ref 1.7–7.7)
Neutrophils Relative %: 64 %
Platelets: 277 10*3/uL (ref 150–400)
RBC: 2.25 MIL/uL — ABNORMAL LOW (ref 3.87–5.11)
RDW: 15.3 % (ref 11.5–15.5)
WBC: 6.6 10*3/uL (ref 4.0–10.5)
nRBC: 0.5 % — ABNORMAL HIGH (ref 0.0–0.2)

## 2019-10-21 LAB — IRON AND TIBC
Iron: 11 ug/dL — ABNORMAL LOW (ref 28–170)
Saturation Ratios: 4 % — ABNORMAL LOW (ref 10.4–31.8)
TIBC: 253 ug/dL (ref 250–450)
UIBC: 242 ug/dL

## 2019-10-21 LAB — HEPATIC FUNCTION PANEL
ALT: 10 U/L (ref 0–44)
AST: 15 U/L (ref 15–41)
Albumin: 2.4 g/dL — ABNORMAL LOW (ref 3.5–5.0)
Alkaline Phosphatase: 73 U/L (ref 38–126)
Bilirubin, Direct: <0.1 mg/dL (ref 0.0–0.2)
Total Bilirubin: 0.8 mg/dL (ref 0.3–1.2)
Total Protein: 6.9 g/dL (ref 6.5–8.1)

## 2019-10-21 LAB — RETICULOCYTES
Immature Retic Fract: 13.8 % (ref 2.3–15.9)
RBC.: 2.52 MIL/uL — ABNORMAL LOW (ref 3.87–5.11)
Retic Count, Absolute: 103.8 10*3/uL (ref 19.0–186.0)
Retic Ct Pct: 4.1 % — ABNORMAL HIGH (ref 0.4–3.1)

## 2019-10-21 LAB — FOLATE: Folate: 12.4 ng/mL (ref >5.9)

## 2019-10-21 LAB — FERRITIN: Ferritin: 64 ng/mL (ref 11–307)

## 2019-10-21 LAB — SARS CORONAVIRUS 2 BY RT PCR (HOSPITAL ORDER, PERFORMED IN ~~LOC~~ HOSPITAL LAB): SARS Coronavirus 2: NEGATIVE

## 2019-10-21 LAB — VITAMIN B12: Vitamin B-12: 235 pg/mL (ref 180–914)

## 2019-10-21 LAB — I-STAT BETA HCG BLOOD, ED (MC, WL, AP ONLY): I-stat hCG, quantitative: <5 m[IU]/mL (ref <5)

## 2019-10-21 LAB — PROTIME-INR
INR: 1.1 (ref 0.8–1.2)
Prothrombin Time: 13.8 seconds (ref 11.4–15.2)

## 2019-10-21 LAB — POTASSIUM: Potassium: 5.4 mmol/L — ABNORMAL HIGH (ref 3.5–5.1)

## 2019-10-21 LAB — PREPARE RBC (CROSSMATCH)

## 2019-10-21 MED ORDER — AMITRIPTYLINE HCL 50 MG PO TABS: 50.0000 mg | ORAL_TABLET | Freq: Every day | ORAL | Status: DC

## 2019-10-21 MED ORDER — METOPROLOL SUCCINATE ER 25 MG PO TB24
12.5000 mg | ORAL_TABLET | Freq: Every day | ORAL | Status: DC
  Administered 2019-10-22 – 2019-10-31 (×10): 12.5 mg via ORAL
  Filled 2019-10-21 (×10): qty 1

## 2019-10-21 MED ORDER — NAPHAZOLINE-GLYCERIN 0.012-0.2 % OP SOLN: 2.0000 [drp] | Freq: Four times a day (QID) | OPHTHALMIC | Status: DC | PRN

## 2019-10-21 MED ORDER — PREGABALIN 50 MG PO CAPS
50.0000 mg | ORAL_CAPSULE | Freq: Two times a day (BID) | ORAL | Status: DC
  Administered 2019-10-22 – 2019-10-31 (×19): 50 mg via ORAL
  Filled 2019-10-21 (×19): qty 1

## 2019-10-21 MED ORDER — FUROSEMIDE 10 MG/ML IJ SOLN
INTRAMUSCULAR | Status: AC
  Administered 2019-10-21: 40 mg via INTRAVENOUS
  Filled 2019-10-21: qty 4

## 2019-10-21 MED ORDER — SODIUM BICARBONATE 650 MG PO TABS
650.0000 mg | ORAL_TABLET | Freq: Two times a day (BID) | ORAL | Status: DC
  Administered 2019-10-22 – 2019-10-31 (×19): 650 mg via ORAL
  Filled 2019-10-21 (×20): qty 1

## 2019-10-21 MED ORDER — HYDROXYZINE HCL 25 MG PO TABS
25.0000 mg | ORAL_TABLET | Freq: Four times a day (QID) | ORAL | Status: DC | PRN
  Administered 2019-10-31: 25 mg via ORAL
  Filled 2019-10-21: qty 1

## 2019-10-21 MED ORDER — SODIUM ZIRCONIUM CYCLOSILICATE 10 G PO PACK
10.0000 g | PACK | Freq: Once | ORAL | Status: AC
  Administered 2019-10-21: 10 g via ORAL
  Filled 2019-10-21: qty 1

## 2019-10-21 MED ORDER — FUROSEMIDE 10 MG/ML IJ SOLN: 40.0000 mg | Freq: Once | INTRAMUSCULAR | Status: AC

## 2019-10-21 MED ORDER — ATORVASTATIN CALCIUM 10 MG PO TABS
20.0000 mg | ORAL_TABLET | Freq: Every day | ORAL | Status: DC
  Administered 2019-10-22 – 2019-10-31 (×10): 20 mg via ORAL
  Filled 2019-10-21 (×10): qty 2

## 2019-10-21 MED ORDER — OXYCODONE HCL 5 MG PO TABS
5.0000 mg | ORAL_TABLET | ORAL | Status: DC | PRN
  Administered 2019-10-22: 10 mg via ORAL
  Administered 2019-10-23 – 2019-10-25 (×7): 5 mg via ORAL
  Administered 2019-10-26 (×2): 10 mg via ORAL
  Administered 2019-10-26: 5 mg via ORAL
  Administered 2019-10-27 – 2019-10-31 (×11): 10 mg via ORAL
  Filled 2019-10-21 (×6): qty 2
  Filled 2019-10-21: qty 1
  Filled 2019-10-21: qty 2
  Filled 2019-10-21 (×2): qty 1
  Filled 2019-10-21 (×2): qty 2
  Filled 2019-10-21 (×2): qty 1
  Filled 2019-10-21 (×2): qty 2
  Filled 2019-10-21 (×2): qty 1
  Filled 2019-10-21 (×2): qty 2
  Filled 2019-10-21: qty 1
  Filled 2019-10-21: qty 2
  Filled 2019-10-21: qty 1

## 2019-10-21 MED ORDER — SODIUM ZIRCONIUM CYCLOSILICATE 10 G PO PACK
10.0000 g | PACK | Freq: Two times a day (BID) | ORAL | Status: DC
  Administered 2019-10-22 – 2019-10-31 (×17): 10 g via ORAL
  Filled 2019-10-21 (×18): qty 1

## 2019-10-21 MED ORDER — IPRATROPIUM-ALBUTEROL 0.5-2.5 (3) MG/3ML IN SOLN
3.0000 mL | Freq: Four times a day (QID) | RESPIRATORY_TRACT | Status: DC
  Administered 2019-10-21: 3 mL via RESPIRATORY_TRACT
  Filled 2019-10-21: qty 3

## 2019-10-21 MED ORDER — LISINOPRIL 10 MG PO TABS: 10.0000 mg | ORAL_TABLET | Freq: Every day | ORAL | Status: DC

## 2019-10-21 MED ORDER — LIRAGLUTIDE 18 MG/3ML ~~LOC~~ SOPN: 1.8000 mg | PEN_INJECTOR | Freq: Every day | SUBCUTANEOUS | Status: DC

## 2019-10-21 MED ORDER — FLUOXETINE HCL 20 MG/5ML PO SOLN: 60.0000 mg | Freq: Every day | ORAL | Status: DC

## 2019-10-21 MED ORDER — SODIUM CHLORIDE 0.9% IV SOLUTION: Freq: Once | INTRAVENOUS | Status: DC

## 2019-10-21 MED ORDER — AMLODIPINE BESYLATE 10 MG PO TABS
10.0000 mg | ORAL_TABLET | Freq: Every day | ORAL | Status: DC
  Administered 2019-10-22 – 2019-10-31 (×10): 10 mg via ORAL
  Filled 2019-10-21 (×11): qty 1

## 2019-10-21 MED ORDER — FLUOXETINE HCL 20 MG PO CAPS: 60.0000 mg | ORAL_CAPSULE | Freq: Every day | ORAL | Status: DC

## 2019-10-21 NOTE — ED Notes (Signed)
1038 - Returned to room to reassess pt prior to transporting to [facility]. Pt asked for emesis bag, RN noted increased work of breathing and sats 87%. Crackles increased from previous assessment bilaterally. Blood paused, NRB applied at 15L, IM MD paged. Call returned promptly from IM MD (Dr. Court Joy), new orders given for Lasix 40mg , directed to reduce rate of blood to 80cc/hr, continue O2 to maintain O2 sats, will upgrade bed level of care to progressive.

## 2019-10-21 NOTE — ED Triage Notes (Signed)
Per [facility] pt from [facility], they reported abnormally low Hgb and Hct.  BP 132/65 HR 93 93% RA CBG 182

## 2019-10-21 NOTE — ED Notes (Signed)
Hepatic function panel added to blood in main lab.

## 2019-10-21 NOTE — ED Notes (Signed)
Report given to 4E RN.  

## 2019-10-21 NOTE — H&P (Signed)
Date: 10/21/2019               Patient Name:  [name] [name] MRN: [id number]  DOB: July 03, 1975 Age / Sex: 44 y.o., female   PCP: Ina Homes, MD         Medical Service: Internal Medicine Teaching Service         Attending Physician: Dr. Sid Falcon, MD    First Contact: Dr. Ronnald Ramp Pager: [phone]  Second Contact: Dr. Truman Hayward Pager: [phone]       After Hours (After 5p/  First Contact Pager: [phone]  weekends / holidays): Second Contact Pager: [phone]   Chief Complaint: abnormal lab and dyspnea  History of Present Illness: [name] [name] is a 44 yo female with a PMHx notable for CKD III-IV, GERD, HTN, hypothyroidism, HLD, OSA on CPAP, DMII, necrotizing fasciitis, obesity, and recent AKA who presented from [facility] due to anemia of <6g/dL also noting some mild dyspnea starting the day before. Last night she says she bent over the bed to grab her phone from the floor and when she sat back up she was SOB to the point that she was lightheaded. She was started on supplemental oxygen but then fell asleep rather unexpectedly. She has had a worsening cough which had initially cleared up a few days past but is not returned. Denies any history of a cough. She was at [facility] and says her physical therapy was going well. She really didn't know anything was going on until she was told aside from the breathing episode the prior day.   Denies any abdominal pain, no difficulty urinating. Denied chest pain, headache, vision changes, dysuria, hematuria, loose stool or palpitations.  In the ED, her Hgb level was confirmed to be 5.7 g/dL for which 2 units of PRBC's were ordered. Additionally, a CXR noted extensive bilateral infiltrates representing either edema or pneumonia with a negative COVID test. Admission was requested as she was noted to be desaturating to ~82% on room air while asymptomatic.    Meds:   No current facility-administered medications on file prior to  encounter.   Current Outpatient Medications on File Prior to Encounter  Medication Sig Dispense Refill  . allopurinol (ZYLOPRIM) 100 MG tablet Take 1 tablet (100 mg total) by mouth 2 (two) times daily. (Patient taking differently: Take 100 mg by mouth as needed (gout). ) 60 tablet 3  . amitriptyline (ELAVIL) 50 MG tablet Take 1 tablet (50 mg total) by mouth at bedtime. 90 tablet 1  . amLODipine (NORVASC) 10 MG tablet Take 1 tablet (10 mg total) by mouth daily.    Marland Kitchen atorvastatin (LIPITOR) 20 MG tablet Take 1 tablet (20 mg total) by mouth daily. 90 tablet 1  . blood glucose meter kit and supplies KIT ICD 10- E11.69. Based on patient and insurance preference. Use up to four times daily as directed. 1 each 0  . bumetanide (BUMEX) 1 MG tablet TAKE 1 TABLET BY MOUTH 2 TIMES DAILY (Patient taking differently: Take 1 mg by mouth See admin instructions. Takes 2 mg in the morning and 1 mg in the afternoon.) 60 tablet 0  . Colchicine 0.6 MG CAPS Take 0.6 mg by mouth daily. (Patient taking differently: Take 0.6 mg by mouth as needed (gout). ) 30 capsule 3  . cyclobenzaprine (FLEXERIL) 5 MG tablet take 1 tablet by mouth daily as needed for muscle spasms (Patient taking differently: Take 5 mg by mouth as needed for muscle spasms. )  30 tablet 0  . diclofenac Sodium (VOLTAREN) 1 % GEL Apply 1 application topically 4 (four) times daily as needed (pain). 100 g 2  . FLUoxetine (PROZAC) 20 MG/5ML solution Take 15 mLs (60 mg total) by mouth daily. 1350 mL 3  . hydrOXYzine (ATARAX/VISTARIL) 25 MG tablet TAKE 1 TABLET BY MOUTH EVERY SIX HOURS AS NEEDED FOR ANXIETY (Patient taking differently: Take 25 mg by mouth every 6 (six) hours as needed for anxiety. ) 40 tablet 0  . lisinopril (ZESTRIL) 10 MG tablet Take 1 tablet (10 mg total) by mouth daily.    . metoprolol succinate (TOPROL-XL) 25 MG 24 hr tablet Take 0.5 tablets (12.5 mg total) by mouth daily.    Marland Kitchen nystatin (MYCOSTATIN/NYSTOP) powder Apply topically 4 times daily.  After cleaning and drying the affected area for two weeks (Patient taking differently: Apply 1 application topically in the morning, at noon, in the evening, and at bedtime. ) 30 g 1  . oxyCODONE (OXY IR/ROXICODONE) 5 MG immediate release tablet Take 1-2 tablets (5-10 mg total) by mouth every 4 (four) hours as needed for moderate pain (pain score 4-6). 30 tablet 0  . pregabalin (LYRICA) 50 MG capsule Take 1 capsule (50 mg total) by mouth 2 (two) times daily. (Patient taking differently: Take 50 mg by mouth 3 (three) times daily. ) 180 capsule 1  . sodium bicarbonate 650 MG tablet Take 1 tablet (650 mg total) by mouth 2 (two) times daily. 60 tablet 0  . sodium zirconium cyclosilicate (LOKELMA) 10 g PACK packet Take 10 g by mouth 2 (two) times daily. (Patient not taking: Reported on 10/11/2019) 60 each 0  . tetrahydrozoline 0.05 % ophthalmic solution Place 2 drops into both eyes 2 (two) times daily as needed (red/ dry eyes). Alcon    . TRUEPLUS PEN NEEDLES 31G X 5 MM MISC Use pen needle with insulin 3 times daily (Patient taking differently: 1 each by Other route 3 (three) times daily. ) 100 each 5  . VICTOZA 18 MG/3ML SOPN Inject 0.3 mLs (1.8 mg total) below the skin daily. (Patient taking differently: Inject 1.8 mg into the skin daily. ) 3 mL 3   Allergies: Allergies as of 10/21/2019 - Review Complete 10/21/2019  Allergen Reaction Noted  . Vancomycin Other (See Comments) 08/24/2012   Past Medical History:  Diagnosis Date  . Abdominal muscle pain 09/08/2016  . Abnormal Pap smear of cervix 2009  . Abscess    history of multiple abscesses  . Acute bilateral low back pain 02/13/2017  . Acute blood loss anemia   . Acute renal failure ([place]) 07/12/2012  . AKI (acute kidney injury) ([place])   . Anemia of chronic disease 2002  . Anxiety    Panic attacks  . Bilateral lower extremity edema 05/13/2016  . Bipolar disorder ([place])   . Cellulitis 05/21/2014   right eye  . Chronic bronchitis ([place])    "get it  q yr" (05/13/2013)  . Chronic pain   . Depression   . Edema of lower extremity   . Endocarditis 2002   subacute bacterial endocarditis.   . Family history of anesthesia complication    "my mom has a hard time coming out from under"  . Fibromyalgia   . GERD (gastroesophageal reflux disease)    occ  . Heart murmur   . Herpes simplex type 1 infection 01/16/2018  . History of blood transfusion    "just low blood count" (05/13/2013)  . Hyperlipidemia   . Hypertension   .  Hypothyroidism   . Hypothyroidism, adult 03/21/2014  . Leukocytosis   . Necrosis ([place])    and ulceration  . Necrotizing fasciitis s/p OR debridements 07/06/2012  . Obesity   . OSA on CPAP    does not wear all the time  . Peripheral neuropathy   . Type II diabetes mellitus (HCC)    Type  II    Family History:  Family History  Problem Relation Age of Onset  . Heart failure Mother   . Diabetes Mother   . Kidney disease Mother   . Kidney disease Father   . Diabetes Father   . Diabetes Paternal Grandmother   . Heart failure Paternal Grandmother   . Other Other   Mother also had amputation and went blind from diabetes.   Social History:  Social History   Tobacco Use  . Smoking status: Former Smoker    Packs/day: 0.50    Years: 16.00    Pack years: 8.00    Types: Cigarettes  . Smokeless tobacco: Former Systems developer    Quit date: 09/16/2019  . Tobacco comment: quit smoking 3wks ago  Substance Use Topics  . Alcohol use: Yes    Alcohol/week: 0.0 standard drinks    Comment: Socially-" monthly  maybe"  . Drug use: No   Review of Systems: A complete ROS was negative except as per HPI.   Physical Exam: Blood pressure (!) 149/78, pulse 100, temperature 98.6 F (37 C), temperature source Oral, resp. rate (!) 21, height '5\' 10"'  (1.778 m), weight 127 kg, SpO2 100 %. Physical Exam Constitutional:      General: She is not in acute distress.    Appearance: She is well-developed. She is ill-appearing. She is not  diaphoretic.  HENT:     Head: Normocephalic and atraumatic.     Nose: Nose normal. No congestion.     Mouth/Throat:     Mouth: Mucous membranes are moist.     Pharynx: No oropharyngeal exudate.  Eyes:     Extraocular Movements: Extraocular movements intact.     Conjunctiva/sclera: Conjunctivae normal.  Cardiovascular:     Rate and Rhythm: Normal rate and regular rhythm.     Heart sounds: No murmur.  Pulmonary:     Effort: Tachypnea present.     Breath sounds: No stridor. Rhonchi (Diffuse) present.  Abdominal:     General: Bowel sounds are normal. There is no distension.     Palpations: Abdomen is soft.     Tenderness: There is no abdominal tenderness.  Musculoskeletal:        General: No tenderness.     Cervical back: Normal range of motion and neck supple.     Right lower leg: Edema present.  Skin:    General: Skin is warm.     Capillary Refill: Capillary refill takes less than 2 seconds.  Neurological:     General: No focal deficit present.     Mental Status: She is alert and oriented to person, place, and time.  Psychiatric:        Mood and Affect: Mood normal.        Behavior: Behavior normal.    EKG: personally reviewed my interpretation is NSR  CXR: personally reviewed my interpretation is bilateral extensive interstitial infiltrates noted  Assessment & Plan by Problem: Principal Problem:   Acute respiratory failure with hypoxia (HCC) Active Problems:   Acute on chronic renal failure (HCC)   Controlled type 2 diabetes mellitus (HCC)   OSA (obstructive sleep  apnea)   Heart failure with preserved ejection fraction (HCC)   Hyperkalemia   Symptomatic anemia   Anemia  Summary: Anatasia Tino is a 44 yo female with a PMHx notable for CKD III-IV, GERD, HTN, hypothyroidism, HLD, OSA on CPAP, DMII, necrotizing fasciitis, obesity, and recent AKA who presented from [facility] due to anemia of <6g/dL also noting some mild dyspnea starting the day before. She is  being admitted for PRBC transfusion, evaluation as to the etiology of her anemia and her hypoxic respiratory failure.  Assessment and Plan: Acute respiratory failure w/ hypoxia: HFpEF: OSA: Her hypoxic respiratory failure is likely multifactorial. She does not have a fever, there is not white count and she is hypertensive. She certainly is at risk for a viral or atypical LRI including COVID despite the negative initial COVID test but she has known HFpEF (G2DD), and OSA while being on Bumex as an outpatient. Her last prior weight that I can find was pre surgery and her creatinine is up in conjunction with her BUN. She has a low albumin which I believe is due to poor protein intake and I am not aware of a history of cirrhosis although nephrosis is not ruled out. There is pitting edema of the lower extremities and her lungs have marked rhonchi/crackles. On exam she does not appear dry, in fact she appears volume overloaded. Additionally, the blood products were not initiated until after her symptoms developed although she appears to be progressively worsening.  -UA to evaluate for proteinuria -Lasix 6m IV once for volume overload (reasses renal fxn in am) -Continue supplemental oxygen PRN, will advance to BiPAP if needed -Admit to progressive -Continue on tele and trend vitals -RVP ordered, if negative consider repeat COVID testing -Droplet precautions ordered -BNP pending  Acute on chronic renal failure: Hyperkalemia: Etiology uncertain. Likely excess volume and anemia related given overall clinical picture. We will trend this after treating with the loop diuretic. Not certain how long she has had the elevated sCr but her last reading was 2.10 just 10 days prior. Uncertain if this just developed or was present after her surgery. I suspect she has not been getting all of the bumex doses as recommended. Is on Bumex 287min the morning and 68m49mn the evening.  -BMP in am -Lasix 31m28mven  once -Continue home lokelma (patient not taking)  DMII: Last A1c 8.5% in April this year. On Victoza 1.8mg 82mly.  -Continue Victoza -Daily evening CBG, consider insulin if elevated -BMP daily  Symptomatic normocytic anemia: Likely due to surgical loss. B12 low normal and folate WNL's. Iron 11 with decreased saturation % and low normal ferritin at 64. Reticulocytes count elevated at 4.1% giving her a reticulocyte index of 0.73% and an absolute retic count of 1.8 indicating an insufficient bone marrow response. She is receiving two units of PRBC's. She would likely benefit from B12 and iron supplementation but may need to see hematology as an outpatient.  -Receiving two units of PRBC's  -CBC in am  Diet: HH/CM Code: Full DVT PPX: Unable to use SCDs and holding chemical due to anemia w/o bleed source noted GI PPX: N/A FEN: K+ up treating with lokelma and lasix,  Dispo: Admit patient to Inpatient with expected length of stay greater than 2 midnights.  Signed: LawreKathi LudwigCone Ascension Set[facility] High[facility] Lakesrnal Medicine, PGY-3  Please see Attending A/P and/or Addendum for final recommendations.

## 2019-10-21 NOTE — ED Provider Notes (Signed)
[facility] EMERGENCY DEPARTMENT Provider Note   CSN: [id number] Arrival date & time: 10/21/19  1605     History Chief Complaint  Patient presents with  . Abnormal Lab    [name] [name] is a 43 y.o. female with a past medical history of DM 2, peripheral neuropathy, obesity, CKD, anxiety, bipolar, hypertension, hyperlipidemia, who presents today from [facility] for evaluation of low H&H.  She was admitted  For a conversion of a BKA to an AKA and discharged on 10/18/19. Chart review shows that her AKA was on 10/11/2019, and it does not appear that she had CBC checked postoperatively.  She reports no significiant shortness of breath.  She denies any fevers or cough.   HPI     Past Medical History:  Diagnosis Date  . Abdominal muscle pain 09/08/2016  . Abnormal Pap smear of cervix 2009  . Abscess    history of multiple abscesses  . Acute bilateral low back pain 02/13/2017  . Acute blood loss anemia   . Acute renal failure ([place]) 07/12/2012  . AKI (acute kidney injury) ([place])   . Anemia of chronic disease 2002  . Anxiety    Panic attacks  . Bilateral lower extremity edema 05/13/2016  . Bipolar disorder ([place])   . Cellulitis 05/21/2014   right eye  . Chronic bronchitis ([place])    "get it q yr" (05/13/2013)  . Chronic pain   . Depression   . Edema of lower extremity   . Endocarditis 2002   subacute bacterial endocarditis.   . Family history of anesthesia complication    "my mom has a hard time coming out from under"  . Fibromyalgia   . GERD (gastroesophageal reflux disease)    occ  . Heart murmur   . Herpes simplex type 1 infection 01/16/2018  . History of blood transfusion    "just low blood count" (05/13/2013)  . Hyperlipidemia   . Hypertension   . Hypothyroidism   . Hypothyroidism, adult 03/21/2014  . Leukocytosis   . Necrosis ([place])    and ulceration  . Necrotizing fasciitis s/p OR debridements 07/06/2012  . Obesity   . OSA on CPAP      does not wear all the time  . Peripheral neuropathy   . Type II diabetes mellitus ([place])    Type  II    Patient Active Problem List   Diagnosis Date Noted  . Symptomatic anemia 10/21/2019  . Blurry vision, right eye 10/08/2019  . Wound dehiscence 09/04/2019  . Right carpal tunnel syndrome 06/14/2019  . Left carpal tunnel syndrome 06/14/2019  . Tobacco use 03/01/2019  . Phantom limb pain ([place]) 11/08/2018  . Current moderate episode of major depressive disorder ([place])   . Anemia of chronic disease   . Severe protein-calorie malnutrition ([place])   . Dehiscence of amputation stump ([place])   . Fall   . Status post below knee amputation, left ([place]) 04/11/2017  . Bipolar affective disorder ([place])   . Adjustment disorder with depressed mood 03/26/2017  . Heart failure with preserved ejection fraction ([place]) 02/09/2017  . Routine adult health maintenance 12/08/2016  . Chronic pain of right knee 09/08/2016  . Chronic kidney disease (CKD), stage III (moderate) 07/15/2016  . OSA (obstructive sleep apnea) 05/13/2016  . Morbid obesity due to excess calories ([place]) 11/27/2015  . Hypothyroidism 04/25/2013  . Controlled type 2 diabetes mellitus ([place]) 07/12/2012  . Carpal tunnel syndrome, bilateral 01/25/2012  . Diabetic polyneuropathy associated  with type 2 diabetes mellitus ([place]) 07/04/2011  . Anxiety and depression 06/02/2011  . GERD 12/06/2007  . Hyperlipidemia 08/29/2007  . Hypertension associated with diabetes ([place]) 08/29/2007    Past Surgical History:  Procedure Laterality Date  . ABDOMINAL AORTOGRAM W/LOWER EXTREMITY Left 03/19/2019   Procedure: ABDOMINAL AORTOGRAM W/LOWER EXTREMITY;  Surgeon: Serafina Mitchell, MD;  Location: [facility];  Service: Cardiovascular;  Laterality: Left;  . ABOVE KNEE LEG AMPUTATION Left 10/11/2019   wound dehisence   . AMPUTATION Left 04/07/2017   Procedure: LEFT BELOW KNEE AMPUTATION;  Surgeon: Newt Minion, MD;  Location: [facility];  Service:  Orthopedics;  Laterality: Left;  . AMPUTATION Left 10/11/2019   Procedure: LEFT ABOVE KNEE AMPUTATION;  Surgeon: Newt Minion, MD;  Location: [facility];  Service: Orthopedics;  Laterality: Left;  . EYE SURGERY     lazer  . INCISION AND DRAINAGE ABSCESS     multiple I&Ds  . INCISION AND DRAINAGE ABSCESS Left 07/09/2012   Procedure: DRESSING CHANGE, THIGH WOUND;  Surgeon: Harl Bowie, MD;  Location: [facility];  Service: General;  Laterality: Left;  . INCISION AND DRAINAGE OF WOUND Left 07/07/2012   Procedure: IRRIGATION AND DEBRIDEMENT WOUND;  Surgeon: Harl Bowie, MD;  Location: [facility];  Service: General;  Laterality: Left;  . INCISION AND DRAINAGE PERIRECTAL ABSCESS Left 07/14/2012   Procedure: DEBRIDEMENT OF SKIN & SOFT TISSUE; DRESSING CHANGE UNDER ANESTHESIA;  Surgeon: Gayland Curry, MD,FACS;  Location: [facility];  Service: General;  Laterality: Left;  . INCISION AND DRAINAGE PERIRECTAL ABSCESS Left 07/16/2012   Procedure: I&D Left Thigh;  Surgeon: Gwenyth Ober, MD;  Location: [facility];  Service: General;  Laterality: Left;  . INCISION AND DRAINAGE PERIRECTAL ABSCESS N/A 01/05/2015   Procedure: IRRIGATION AND DEBRIDEMENT PERIRECTAL ABSCESS;  Surgeon: Donnie Mesa, MD;  Location: [facility];  Service: General;  Laterality: N/A;  . IRRIGATION AND DEBRIDEMENT ABSCESS Left 07/06/2012   Procedure: IRRIGATION AND DEBRIDEMENT ABSCESS BUTTOCKS AND THIGH;  Surgeon: Shann Medal, MD;  Location: [facility];  Service: General;  Laterality: Left;  . IRRIGATION AND DEBRIDEMENT ABSCESS Left 08/10/2012   Procedure: IRRIGATION AND DEBRIDEMENT ABSCESS;  Surgeon: Madilyn Hook, DO;  Location: [facility];  Service: General;  Laterality: Left;  . STUMP REVISION Left 06/09/2017   Procedure: REVISION LEFT BELOW KNEE AMPUTATION;  Surgeon: Newt Minion, MD;  Location: [facility];  Service: Orthopedics;  Laterality: Left;  . STUMP REVISION Left 09/04/2019   Procedure: LEFT BELOW KNEE AMPUTATION REVISION;  Surgeon: Newt Minion, MD;   Location: [facility];  Service: Orthopedics;  Laterality: Left;  . STUMP REVISION Left 09/20/2019   Procedure: LEFT BELOW KNEE AMPUTATION REVISION;  Surgeon: Newt Minion, MD;  Location: [facility];  Service: Orthopedics;  Laterality: Left;  . TONSILLECTOMY  1994     OB History   No obstetric history on file.     Family History  Problem Relation Age of Onset  . Heart failure Mother   . Diabetes Mother   . Kidney disease Mother   . Kidney disease Father   . Diabetes Father   . Diabetes Paternal Grandmother   . Heart failure Paternal Grandmother   . Other Other     Social History   Tobacco Use  . Smoking status: Former Smoker    Packs/day: 0.50    Years: 16.00    Pack years: 8.00    Types: Cigarettes  . Smokeless tobacco: Former Systems developer  Quit date: 09/16/2019  . Tobacco comment: quit smoking 3wks ago  Substance Use Topics  . Alcohol use: Yes    Alcohol/week: 0.0 standard drinks    Comment: Socially-" monthly  maybe"  . Drug use: No    Home Medications Prior to Admission medications   Medication Sig Start Date End Date Taking? Authorizing Provider  allopurinol (ZYLOPRIM) 100 MG tablet Take 1 tablet (100 mg total) by mouth 2 (two) times daily. Patient taking differently: Take 100 mg by mouth as needed (gout).  04/16/19   Newt Minion, MD  amitriptyline (ELAVIL) 50 MG tablet Take 1 tablet (50 mg total) by mouth at bedtime. 09/10/19   Ina Homes, MD  amLODipine (NORVASC) 10 MG tablet Take 1 tablet (10 mg total) by mouth daily. 10/07/19   Chundi, Verne Spurr, MD  atorvastatin (LIPITOR) 20 MG tablet Take 1 tablet (20 mg total) by mouth daily. 09/11/19   Ina Homes, MD  blood glucose meter kit and supplies KIT ICD 10- E11.69. Based on patient and insurance preference. Use up to four times daily as directed. 05/01/18   Love, Ivan Anchors, PA-C  bumetanide (BUMEX) 1 MG tablet TAKE 1 TABLET BY MOUTH 2 TIMES DAILY Patient taking differently: Take 1 mg by mouth See admin instructions. Takes  2 mg in the morning and 1 mg in the afternoon. 08/19/19   Ina Homes, MD  Colchicine 0.6 MG CAPS Take 0.6 mg by mouth daily. Patient taking differently: Take 0.6 mg by mouth as needed (gout).  04/16/19   Newt Minion, MD  cyclobenzaprine (FLEXERIL) 5 MG tablet take 1 tablet by mouth daily as needed for muscle spasms Patient taking differently: Take 5 mg by mouth as needed for muscle spasms.  10/09/19   Ina Homes, MD  diclofenac Sodium (VOLTAREN) 1 % GEL Apply 1 application topically 4 (four) times daily as needed (pain). 06/26/19   Ina Homes, MD  FLUoxetine (PROZAC) 20 MG/5ML solution Take 15 mLs (60 mg total) by mouth daily. 06/29/18 10/11/19  Ina Homes, MD  hydrOXYzine (ATARAX/VISTARIL) 25 MG tablet TAKE 1 TABLET BY MOUTH EVERY SIX HOURS AS NEEDED FOR ANXIETY Patient taking differently: Take 25 mg by mouth every 6 (six) hours as needed for anxiety.  08/12/19   Bartholomew Crews, MD  lisinopril (ZESTRIL) 10 MG tablet Take 1 tablet (10 mg total) by mouth daily. 10/07/19   Chundi, Verne Spurr, MD  metoprolol succinate (TOPROL-XL) 25 MG 24 hr tablet Take 0.5 tablets (12.5 mg total) by mouth daily. 10/07/19   Chundi, Verne Spurr, MD  nystatin (MYCOSTATIN/NYSTOP) powder Apply topically 4 times daily. After cleaning and drying the affected area for two weeks Patient taking differently: Apply 1 application topically in the morning, at noon, in the evening, and at bedtime.  09/10/19   Ina Homes, MD  oxyCODONE (OXY IR/ROXICODONE) 5 MG immediate release tablet Take 1-2 tablets (5-10 mg total) by mouth every 4 (four) hours as needed for moderate pain (pain score 4-6). 10/15/19   Persons, Bevely Palmer, PA  pregabalin (LYRICA) 50 MG capsule Take 1 capsule (50 mg total) by mouth 2 (two) times daily. Patient taking differently: Take 50 mg by mouth 3 (three) times daily.  11/08/18   Ina Homes, MD  sodium bicarbonate 650 MG tablet Take 1 tablet (650 mg total) by mouth 2 (two) times daily. 09/26/19    Asencion Noble, MD  sodium zirconium cyclosilicate (LOKELMA) 10 g PACK packet Take 10 g by mouth 2 (two) times daily. Patient not taking:  Reported on 10/11/2019 09/26/19   Asencion Noble, MD  tetrahydrozoline 0.05 % ophthalmic solution Place 2 drops into both eyes 2 (two) times daily as needed (red/ dry eyes). Alcon    [provider]  TRUEPLUS PEN NEEDLES 31G X 5 MM MISC Use pen needle with insulin 3 times daily Patient taking differently: 1 each by Other route 3 (three) times daily.  08/12/19   Bartholomew Crews, MD  VICTOZA 18 MG/3ML SOPN Inject 0.3 mLs (1.8 mg total) below the skin daily. Patient taking differently: Inject 1.8 mg into the skin daily.  07/22/19   Ina Homes, MD    Allergies    Vancomycin  Review of Systems   Review of Systems  Constitutional: Negative for chills and fever.  HENT: Negative for congestion.   Eyes: Negative for visual disturbance.  Respiratory: Negative for chest tightness and shortness of breath.   Cardiovascular: Positive for leg swelling (Chronic, unchanged). Negative for chest pain.  Gastrointestinal: Negative for abdominal pain, diarrhea, nausea and vomiting.  Genitourinary: Negative for dysuria.  Musculoskeletal: Negative for back pain and neck pain.       Stump pain  Skin: Negative for color change and rash.  Neurological: Negative for weakness and headaches.  All other systems reviewed and are negative.   Physical Exam Updated Vital Signs BP (!) 124/57   Pulse 95   Temp 98.8 F (37.1 C) (Oral)   Resp (!) 25   Ht '5\' 10"'  (1.778 m)   Wt 127 kg   SpO2 95%   BMI 40.18 kg/m   Physical Exam Vitals and nursing note reviewed.  Constitutional:      General: She is not in acute distress.    Appearance: She is well-developed. She is not diaphoretic.  HENT:     Head: Normocephalic and atraumatic.  Eyes:     General: No scleral icterus.       Right eye: No discharge.        Left eye: No discharge.      Conjunctiva/sclera: Conjunctivae normal.     Comments: Palor  Cardiovascular:     Rate and Rhythm: Normal rate and regular rhythm.     Pulses: Normal pulses.     Heart sounds: Normal heart sounds.  Pulmonary:     Effort: Pulmonary effort is normal. No bradypnea, accessory muscle usage or respiratory distress.     Breath sounds: Normal air entry. No stridor.  Chest:     Chest wall: No tenderness.  Abdominal:     General: There is no distension.     Palpations: Abdomen is soft.     Tenderness: There is no abdominal tenderness. There is no guarding.  Musculoskeletal:     Cervical back: Normal range of motion.     Right lower leg: Edema present.     Comments: Left leg AKA, wound site is CDI with staples and sutures present.   Skin:    General: Skin is warm and dry.  Neurological:     General: No focal deficit present.     Mental Status: She is alert.     Cranial Nerves: No cranial nerve deficit.     Motor: No abnormal muscle tone.  Psychiatric:        Mood and Affect: Mood normal.        Behavior: Behavior normal.     ED Results / Procedures / Treatments   Labs (all labs ordered are listed, but only abnormal results are displayed) Labs Reviewed  CBC WITH DIFFERENTIAL/PLATELET - Abnormal; Notable for the following components:      Result Value   RBC 2.25 (*)    Hemoglobin 5.7 (*)    HCT 18.8 (*)    MCH 25.3 (*)    nRBC 0.5 (*)    All other components within normal limits  BASIC METABOLIC PANEL - Abnormal; Notable for the following components:   Sodium 133 (*)    Potassium 5.5 (*)    Glucose, Bld 113 (*)    BUN 85 (*)    Creatinine, Ser 2.69 (*)    Calcium 8.5 (*)    GFR calc non Af Amer 21 (*)    GFR calc Af Amer 24 (*)    All other components within normal limits  IRON AND TIBC - Abnormal; Notable for the following components:   Iron 11 (*)    Saturation Ratios 4 (*)    All other components within normal limits  RETICULOCYTES - Abnormal; Notable for the following  components:   Retic Ct Pct 4.1 (*)    RBC. 2.52 (*)    All other components within normal limits  POTASSIUM - Abnormal; Notable for the following components:   Potassium 5.4 (*)    All other components within normal limits  SARS CORONAVIRUS 2 BY RT PCR (HOSPITAL ORDER, [facility])  VITAMIN B12  FERRITIN  PROTIME-INR  FOLATE  HEPATIC FUNCTION PANEL  I-STAT BETA HCG BLOOD, ED ([facility], [facility], [facility] ONLY)  TYPE AND SCREEN  PREPARE RBC (CROSSMATCH)    EKG None  Radiology DG Chest Port 1 View  Result Date: 10/21/2019 CLINICAL DATA:  Hypoxia.  Productive cough. EXAM: PORTABLE CHEST 1 VIEW COMPARISON:  Chest x-ray dated 09/01/2019 FINDINGS: The patient has extensive infiltrates throughout the left lung as well as at the right lung base laterally. Heart is within normal limits considering the AP portable technique. Pulmonary vascularity is normal. No effusions. No acute bone abnormality. IMPRESSION: Extensive bilateral pulmonary infiltrates. This could represent pneumonia or pulmonary edema. However, there is no pulmonary vascular congestion. Electronically Signed   By: Lorriane Shire M.D.   On: 10/21/2019 18:44    Procedures .Critical Care Performed by: Lorin Glass, PA-C Authorized by: Lorin Glass, PA-C   Critical care provider statement:    Critical care time (minutes):  45   Critical care was necessary to treat or prevent imminent or life-threatening deterioration of the following conditions:  Circulatory failure and respiratory failure   Critical care was time spent personally by me on the following activities:  Discussions with consultants, evaluation of patient's response to treatment, examination of patient, ordering and performing treatments and interventions, ordering and review of laboratory studies, ordering and review of radiographic studies, pulse oximetry, re-evaluation of patient's condition, obtaining history from patient or surrogate and  review of old charts   (including critical care time)  Medications Ordered in ED Medications  0.9 %  sodium chloride infusion (Manually program via Guardrails IV Fluids) (has no administration in time range)  sodium zirconium cyclosilicate (LOKELMA) packet 10 g (has no administration in time range)  2 units PRBC.    ED Course  I have reviewed the triage vital signs and the nursing notes.  Pertinent labs & imaging results that were available during my care of the patient were reviewed by me and considered in my medical decision making (see chart for details).   Clinical Course as of Oct 20 2112  Mon Oct 21, 2019  2030 I spoke with IMTS resident who will admit patient.    [EH]    Clinical Course User Index [EH] Lorin Glass, PA-C   MDM Rules/Calculators/A&P                     Of note when I went into patients room to evaluate her she was hypoxic into the 70s with a good wave form.  No increased work of breathing, denies feeling short of breath.  She was placed on 3 lpm and increased to the 90s.   Chart review shows that she does not appear to have had a CBC checked after her AKA.  Here today she is anemic hemoglobin of 5.7, consistent with her reported anemia from SNF.  She was also notably hypoxic.  While she denied a cough I heard her coughing multiple times in the room.  She is not vaccinated against Covid.  Given how hypoxic she was without significant symptoms I am concerned that she may in fact have Covid.  She is placed on droplet and contact precautions.  Chest x-ray obtained showing extensive bilateral pulmonary infiltrates.  She did have recent surgery, however increased risk of DVT.  With her creatinine she is unable to undergo CTA PE study.  Plan to obtain Covid test, if that is negative then will need to revisit PE.    I spoke with patient about risks of blood transfusion, alternatives and indication.  Patient made informed decision to receive transfusion.  Anemia  panel sent.  Additionally her potassium is elevated.  Chart review shows that she has previously been on lokelma, but is not taking it.  Loklema ordered.   Covid testing is in process.  She is afebrile.    BMP shows that she has elevated Cr, consistent with her baseline, GFR is also decreased.    I spoke with IMTS who will see patient for admission.    Note: Portions of this report may have been transcribed using voice recognition software. Every effort was made to ensure accuracy; however, inadvertent computerized transcription errors may be present.    Final Clinical Impression(s) / ED Diagnoses Final diagnoses:  Anemia, unspecified type  Hypoxia  Hx of AKA (above knee amputation), left [facility])    Rx / DC Orders ED Discharge Orders    None       Ollen Gross 10/21/19 2115    Varney Biles, MD 10/28/19 [phone]

## 2019-10-22 ENCOUNTER — Encounter (HOSPITAL_COMMUNITY): Payer: Medicare HMO

## 2019-10-22 ENCOUNTER — Inpatient Hospital Stay (HOSPITAL_COMMUNITY): Payer: Medicare HMO

## 2019-10-22 DIAGNOSIS — R0902 Hypoxemia: Secondary | ICD-10-CM

## 2019-10-22 DIAGNOSIS — J9601 Acute respiratory failure with hypoxia: Secondary | ICD-10-CM

## 2019-10-22 DIAGNOSIS — N189 Chronic kidney disease, unspecified: Secondary | ICD-10-CM

## 2019-10-22 DIAGNOSIS — Z89612 Acquired absence of left leg above knee: Secondary | ICD-10-CM

## 2019-10-22 DIAGNOSIS — E875 Hyperkalemia: Secondary | ICD-10-CM

## 2019-10-22 DIAGNOSIS — G4733 Obstructive sleep apnea (adult) (pediatric): Secondary | ICD-10-CM

## 2019-10-22 DIAGNOSIS — N179 Acute kidney failure, unspecified: Secondary | ICD-10-CM

## 2019-10-22 LAB — URINALYSIS, ROUTINE W REFLEX MICROSCOPIC
Bilirubin Urine: NEGATIVE
Glucose, UA: 50 mg/dL — AB
Hgb urine dipstick: NEGATIVE
Ketones, ur: NEGATIVE mg/dL
Leukocytes,Ua: NEGATIVE
Nitrite: NEGATIVE
Protein, ur: 100 mg/dL — AB
Specific Gravity, Urine: 1.01 (ref 1.005–1.030)
pH: 7 (ref 5.0–8.0)

## 2019-10-22 LAB — RESPIRATORY PANEL BY PCR

## 2019-10-22 LAB — GLUCOSE, CAPILLARY: Glucose-Capillary: 147 mg/dL — ABNORMAL HIGH (ref 70–99)

## 2019-10-22 LAB — BASIC METABOLIC PANEL
Anion gap: 10 (ref 5–15)
BUN: 78 mg/dL — ABNORMAL HIGH (ref 6–20)
CO2: 24 mmol/L (ref 22–32)
Calcium: 8.9 mg/dL (ref 8.9–10.3)
Chloride: 103 mmol/L (ref 98–111)
Creatinine, Ser: 2.54 mg/dL — ABNORMAL HIGH (ref 0.44–1.00)
GFR calc Af Amer: 26 mL/min — ABNORMAL LOW (ref >60)
GFR calc non Af Amer: 22 mL/min — ABNORMAL LOW (ref >60)
Glucose, Bld: 133 mg/dL — ABNORMAL HIGH (ref 70–99)
Potassium: 5.4 mmol/L — ABNORMAL HIGH (ref 3.5–5.1)
Sodium: 137 mmol/L (ref 135–145)

## 2019-10-22 LAB — CBC
HCT: 22.3 % — ABNORMAL LOW (ref 36.0–46.0)
Hemoglobin: 7 g/dL — ABNORMAL LOW (ref 12.0–15.0)
MCH: 25.5 pg — ABNORMAL LOW (ref 26.0–34.0)
MCHC: 31.4 g/dL (ref 30.0–36.0)
MCV: 81.4 fL (ref 80.0–100.0)
Platelets: 296 10*3/uL (ref 150–400)
RBC: 2.74 MIL/uL — ABNORMAL LOW (ref 3.87–5.11)
RDW: 16.5 % — ABNORMAL HIGH (ref 11.5–15.5)
WBC: 7.9 10*3/uL (ref 4.0–10.5)
nRBC: 0.5 % — ABNORMAL HIGH (ref 0.0–0.2)

## 2019-10-22 LAB — BRAIN NATRIURETIC PEPTIDE: B Natriuretic Peptide: 154.8 pg/mL — ABNORMAL HIGH (ref 0.0–100.0)

## 2019-10-22 LAB — ECHOCARDIOGRAM COMPLETE
Height: 70 in
Weight: 4853.65 oz

## 2019-10-22 MED ORDER — ACETAMINOPHEN 325 MG PO TABS
650.0000 mg | ORAL_TABLET | Freq: Four times a day (QID) | ORAL | Status: DC | PRN
  Administered 2019-10-22: 650 mg via ORAL
  Filled 2019-10-22: qty 2

## 2019-10-22 MED ORDER — ACETAMINOPHEN 120 MG RE SUPP
120.0000 mg | Freq: Four times a day (QID) | RECTAL | Status: DC | PRN
  Filled 2019-10-22: qty 1

## 2019-10-22 MED ORDER — IPRATROPIUM-ALBUTEROL 0.5-2.5 (3) MG/3ML IN SOLN: 3.0000 mL | Freq: Four times a day (QID) | RESPIRATORY_TRACT | Status: DC | PRN

## 2019-10-22 MED ORDER — LINEZOLID 600 MG/300ML IV SOLN
600.0000 mg | INTRAVENOUS | Status: AC
  Administered 2019-10-22: 600 mg via INTRAVENOUS
  Filled 2019-10-22: qty 300

## 2019-10-22 MED ORDER — LINEZOLID 600 MG/300ML IV SOLN
600.0000 mg | Freq: Two times a day (BID) | INTRAVENOUS | Status: AC
  Administered 2019-10-23 – 2019-10-29 (×14): 600 mg via INTRAVENOUS
  Filled 2019-10-22 (×14): qty 300

## 2019-10-22 MED ORDER — FUROSEMIDE 10 MG/ML IJ SOLN
40.0000 mg | Freq: Once | INTRAMUSCULAR | Status: AC
  Administered 2019-10-22: 40 mg via INTRAVENOUS
  Filled 2019-10-22: qty 4

## 2019-10-22 MED ORDER — PERFLUTREN LIPID MICROSPHERE
1.0000 mL | INTRAVENOUS | Status: AC | PRN
  Administered 2019-10-22: 1 mL via INTRAVENOUS
  Filled 2019-10-22: qty 10

## 2019-10-22 MED ORDER — SODIUM CHLORIDE 0.9 % IV SOLN
2.0000 g | INTRAVENOUS | Status: DC
  Administered 2019-10-22 – 2019-10-26 (×5): 2 g via INTRAVENOUS
  Filled 2019-10-22 (×6): qty 2

## 2019-10-22 NOTE — Progress Notes (Signed)
Pt's family called throughout shift for an update on pt's status. Pt gave this nurse permission to speak to Ms. Ivin Booty & Ms. [name] regarding her status. Pt is currently on bipap 40%. Pt was briefly taken off of bipap for oral swab. Pt's O2 sats dropped in the 70s with labored breathing. Bipap is currently on. Pt is resting. Will continue to monitor.

## 2019-10-22 NOTE — Progress Notes (Signed)
Pt arrived to 4e15 from ED. Pt is continuing to receive RBC and is currently on nonrebreather mask. O2 sat is 100%. Pt is a&ox4. CHG bath given. Tele wires placed. CCMD notified. Rapid Response Mindy, RN at bedside assessing pt. New orders to start pt bpap. Pt requests to update her info with Kennith Gain (sister) or [name] (aunt). Call bell within reach. Will continue to monitor.   Fransico Michael, RN

## 2019-10-22 NOTE — Progress Notes (Signed)
Echocardiogram 2D Echocardiogram has been performed.  Oneal Deputy Nhan Qualley 10/22/2019, 10:39 AM

## 2019-10-22 NOTE — Progress Notes (Signed)
Rounded on patient to evaluate status after giving lasix and starting Bipap. 1 unit of pRBC completed.  Has put out ~600 cc of urine. Patient resting comfortably , hemodynamically stable.

## 2019-10-22 NOTE — Progress Notes (Signed)
Pharmacy Antibiotic Note  JAVAYA OREGON is a 44 y.o. female admitted on 10/21/2019 with pneumonia.  Pharmacy has been consulted for Zyvox and Cefepime dosing.  CC/HPI: abnormal lab and dyspnea with Hgb <6 from [facility]   PMH: CKD III-IV, GERD, HTN, hypothyroidism, HLD, OSA on CPAP, DMII, necrotizing fasciitis, obesity, and recent AKA, see long list   ID: Abx for HCAP. Tmax 101.2. WBC 6.6. Scr up today  Zyvox 6/1>> Cefepime 6/1>>  5/31: RVP: neg 6/1: BC x 2>>  Plan: D/c Vanco ok by IM. Start Zyvox 600mg  IV q12h Cefepime 2g IV q24h D/c Prozac and Elavil while on Zyvox due to the elevated risk of serotonin syndrome. F/u when Zyvox d/c'd to resume Prozac and Elavil     Height: 5\' 10"  (177.8 cm) Weight: (!) 137.6 kg (303 lb 5.7 oz)(BED ZEROED) IBW/kg (Calculated) : 68.5  Temp (24hrs), Avg:98.8 F (37.1 C), Min:97.6 F (36.4 C), Max:101.2 F (38.4 C)  Recent Labs  Lab 10/21/19 1622  WBC 6.6  CREATININE 2.69*    Estimated Creatinine Clearance: 40.9 mL/min (A) (by C-G formula based on SCr of 2.69 mg/dL (H)).    Allergies  Allergen Reactions  . Vancomycin Other (See Comments)    Acute renal failure suspected secondary to [place]. Alford Highland, PharmD, BCPS Clinical Staff Pharmacist [email or web address] [place] 10/22/2019 9:41 AM

## 2019-10-22 NOTE — Progress Notes (Signed)
Subjective: Pt seen at the bedside this morning. [name] [name] is resting comfortably on BiPAP. States [name] [name] is feeling well. Had episode of dyspnea at SNF in addition to productive cough since surgery/discharge last week. Thinks might have missed one dose of PM bumex, but otherwise thinks the SNF has been adherent with her meds. All questions and concerns addressed.  Objective:  Vital signs in last 24 hours: Vitals:   10/22/19 0200 10/22/19 0400 10/22/19 0515 10/22/19 0618  BP: 122/66 (!) 145/84 (!) 160/83 (!) 150/83  Pulse: 93 95 (!) 105 (!) 106  Resp: 12 18 (!) 23 20  Temp:  98.7 F (37.1 C) 97.6 F (36.4 C) 97.6 F (36.4 C)  TempSrc:  Oral Oral Axillary  SpO2: 90% 93% 90% 95%  Weight:      Height:       Physical Exam Vitals and nursing note reviewed.  Constitutional:      General: [name] [name] is not in acute distress.    Appearance: [name] [name] is obese. [name] [name] is ill-appearing (chronically).  Pulmonary:     Comments: Normal respiratory effort on BiPAP. O2 saturation 98%. Speaking in complete sentences. Scattered rhonchi in R middle and lower lobes, also dull to percussion in this area. Good air movement throughout. Minimal crackles. Musculoskeletal:        General: No tenderness, deformity or signs of injury.     Comments: Trace bilateral pitting edema.  Skin:    General: Skin is warm and dry.     Comments: No drainage or pus from surgical wound on L stump. Overlying skin without erythema or warmth. Staples in place.  Neurological:     Mental Status: [name] [name] is alert.    Assessment/Plan:  Principal Problem:   Acute respiratory failure with hypoxia (HCC) Active Problems:   Acute on chronic renal failure (HCC)   Controlled type 2 diabetes mellitus (HCC)   OSA (obstructive sleep apnea)   Heart failure with preserved ejection fraction (HCC)   Hyperkalemia   Symptomatic anemia   Anemia  Assessment and Plan: [name] [name] is a 44 yo female with a PMHx notable for CKD III-IV, GERD, HTN,  hypothyroidism, HLD, OSA on CPAP, DMII, necrotizing fasciitis, obesity, and recent AKA who presented from [facility] due to anemia of <6g/dL also noting some mild dyspnea starting the day before.   Acute respiratory failure w/ hypoxia: HAP: HFpEF: Likely multifactorial, though HAP seems to be driving the clinical picture at this point.Pt spiked fever after admission intake. Tmax 101.2. No leukocytosis. COVID and RVP negative. CXR read as extensive bilateral infiltrates representing pneumonia vs pulmonary edema. On exam, pt has dullness to percussion on the R side and rhonchi in the same area on ausculation. Trace pitting edema in LE's bilaterally. BNP mildly up at 154. Net positive 80cc after one dose 40mg  IV lasix and 2 units pRBCs. Last echo in 2018 with normal EF and grade II diastolic dysfunction. - antibiotics with cefepime and zyvox for HAP (no vanc given pt's renal function) - holding pt's home prozac and elavil while on zyvox - continue PRN BiPAP - tylenol PRN - additional 40mg  IV lasix dose this AM - repeat echo  Acute on chronic renal failure: Hyperkalemia: Cr 2.69 on admission, up from 2.10 ten days prior. Etiology uncertain. Likely excess volume/cardiorenal and anemia related given overall clinical picture and improvement this AM after loop diuretic.  - Cr mildly improved this AM 2.69 >> 2.54 - K 5.4 - repeat lasix dosing today as above  -  resume home BID lokelma  Symptomatic normocytic anemia: Hgb 5.7 on admission. Likely from acute surgical blood loss. B12 low-normal and folate normal. Low-normal ferritin at 64, with iron low at 11 and low saturation ratio. Absolute retic count of 1.8 indicating insufficient bone marrow response. S/p 2u pRBCs. - Hgb 7.0 this AM - trend Hgb - would recommend iron and B12 outpatient supplementation   DMII: Last A1c 8.5% in April this year. On Victoza 1.8mg  daily.  - continue home Victoza - daily evening CBG, consider insulin if  elevated  Diet - heart healthy/carb modified Fluids - none DVT ppx - unable to use SCDs and holding pharmacologic ppx due to acute anemia CODE STATUS - FULL CODE  Prior to Admission Living Arrangement: SNF Anticipated Discharge Location: SNF Barriers to Discharge: clinical improvement Dispo: Anticipated discharge in approximately 2-3 day(s).   [name] Horns, MD 10/22/2019, 6:38 AM Pager: [phone]

## 2019-10-22 NOTE — Significant Event (Addendum)
Rapid Response Event Note  Overview: Called originally by ED RN d/t pt with labored respirations that is assigned to tele floor. Pt here with anemia, hbg-5.7. 1st unit of 2 PRBCs started in ED and pt breathing became labored, SpO2 dropped to 80s on 5LNC, and crackles heard in lungs. Pt was placed on NRB.  ED RN concerned about pt going to tele floor. MD notified and orders obtained for 40mg  lasix, PRBC rate to be slowed down, and pt to go to PCU. Lasix given in ED prior to transfer. On arrival to PCU, pt breathing more labored per ED RN. At this time, RRT came to assess pt.    Initial Focused Assessment: Pt laying in bed with eyes opened, +WOB +accessory muscle use. Pt is alert and oriented, denies chest pain. Lungs with crackles t/o. Skin hot to touch. PRBCs infusing at 80cc/hr. T-99, HR-100, BP-141/79, RR-24, SpO2-100% on NRB.   Interventions: Bipap Lokelma ordered previously-will hold d/t resp distress  Plan of Care (if not transferred): Bipap. Monitor lasix response and respiratory status closely. MD ok giving additional dose of lasix if needed-especially since 2nd unit of PRBCs still left to transfuse.Call RRT if further assistance needed.  Event Summary:  Dr. Berline Lopes notified at [place]: [id number] Arrived: 2354 Ended: 0030  Dillard [place]

## 2019-10-22 NOTE — Plan of Care (Signed)
  Problem: Education: Goal: Knowledge of General Education information will improve Description: Including pain rating scale, medication(s)/side effects and non-pharmacologic comfort measures Outcome: Progressing   Problem: Clinical Measurements: Goal: Ability to maintain clinical measurements within normal limits will improve Outcome: Progressing Goal: Will remain free from infection Outcome: Progressing Goal: Respiratory complications will improve Outcome: Progressing   

## 2019-10-23 DIAGNOSIS — I5033 Acute on chronic diastolic (congestive) heart failure: Secondary | ICD-10-CM

## 2019-10-23 LAB — CBC
HCT: 23.2 % — ABNORMAL LOW (ref 36.0–46.0)
Hemoglobin: 7.2 g/dL — ABNORMAL LOW (ref 12.0–15.0)
MCH: 25.8 pg — ABNORMAL LOW (ref 26.0–34.0)
MCHC: 31 g/dL (ref 30.0–36.0)
MCV: 83.2 fL (ref 80.0–100.0)
Platelets: 257 10*3/uL (ref 150–400)
RBC: 2.79 MIL/uL — ABNORMAL LOW (ref 3.87–5.11)
RDW: 16.4 % — ABNORMAL HIGH (ref 11.5–15.5)
WBC: 10.1 10*3/uL (ref 4.0–10.5)
nRBC: 0 % (ref 0.0–0.2)

## 2019-10-23 LAB — TYPE AND SCREEN
ABO/RH(D): B POS
Antibody Screen: NEGATIVE
Unit division: 0
Unit division: 0

## 2019-10-23 LAB — BASIC METABOLIC PANEL
Anion gap: 10 (ref 5–15)
BUN: 71 mg/dL — ABNORMAL HIGH (ref 6–20)
CO2: 21 mmol/L — ABNORMAL LOW (ref 22–32)
Calcium: 8.5 mg/dL — ABNORMAL LOW (ref 8.9–10.3)
Chloride: 101 mmol/L (ref 98–111)
Creatinine, Ser: 2.57 mg/dL — ABNORMAL HIGH (ref 0.44–1.00)
GFR calc Af Amer: 26 mL/min — ABNORMAL LOW (ref >60)
GFR calc non Af Amer: 22 mL/min — ABNORMAL LOW (ref >60)
Glucose, Bld: 159 mg/dL — ABNORMAL HIGH (ref 70–99)
Potassium: 5.4 mmol/L — ABNORMAL HIGH (ref 3.5–5.1)
Sodium: 132 mmol/L — ABNORMAL LOW (ref 135–145)

## 2019-10-23 LAB — BPAM RBC
Blood Product Expiration Date: 202106072359
Blood Product Expiration Date: 202106072359
ISSUE DATE / TIME: 202105312019
ISSUE DATE / TIME: 202106010123
Unit Type and Rh: 9500
Unit Type and Rh: 9500

## 2019-10-23 LAB — GLUCOSE, CAPILLARY
Glucose-Capillary: 156 mg/dL — ABNORMAL HIGH (ref 70–99)
Glucose-Capillary: 194 mg/dL — ABNORMAL HIGH (ref 70–99)

## 2019-10-23 MED ORDER — FUROSEMIDE 10 MG/ML IJ SOLN: 40.0000 mg | Freq: Once | INTRAMUSCULAR | Status: DC

## 2019-10-23 MED ORDER — FUROSEMIDE 10 MG/ML IJ SOLN
40.0000 mg | Freq: Two times a day (BID) | INTRAMUSCULAR | Status: AC
  Administered 2019-10-23 (×2): 40 mg via INTRAVENOUS
  Filled 2019-10-23 (×2): qty 4

## 2019-10-23 MED ORDER — ENOXAPARIN SODIUM 40 MG/0.4ML ~~LOC~~ SOLN: 40.0000 mg | SUBCUTANEOUS | Status: DC

## 2019-10-23 MED ORDER — ENOXAPARIN SODIUM 80 MG/0.8ML ~~LOC~~ SOLN
70.0000 mg | SUBCUTANEOUS | Status: DC
  Administered 2019-10-23: 70 mg via SUBCUTANEOUS
  Filled 2019-10-23: qty 0.8

## 2019-10-23 NOTE — Evaluation (Addendum)
Occupational Therapy Evaluation Patient Details Name: [name] [name] MRN: [id number] DOB: August 03, 1975 Today's Date: 10/23/2019    History of Present Illness Pt is a 44 yo female s/p recent LLE above the knee amputation on 5/26 and + for bilateral infiltrates and she was noted to be hypoxic. Significant PMH of DM2, obesity, and bipolar disorder.    Clinical Impression   PT PTA: Pt came from SNF s/p L AKA. Pt reports supervisionA level for ADL. Pt currently,  OOB mobility deferred as pt's O2 very high on 15L [place] and pt desatting into mid 80s rolling in bed side to side with modA overall to facilitate movement; pt requiring modA to [place] with all ADL other than set-upA for feeding/grooming due to fatigue. Pt limited by quickly desatting, decreased ability to care for self and decreased strength. Pt would benefit from continued OT skilled services for ADL, mobility and energy conservation. OT following acutely.     Follow Up Recommendations  CIR    Equipment Recommendations  Other (comment)(defer to next facility)    Recommendations for Other Services Rehab consult     Precautions / Restrictions Precautions Precautions: Fall Restrictions Weight Bearing Restrictions: Yes LLE Weight Bearing: Non weight bearing Other Position/Activity Restrictions: BKA revision to AKA      Mobility Bed Mobility Overal bed mobility: Needs Assistance Bed Mobility: Rolling Rolling: Mod assist         General bed mobility comments: using bedrail  Transfers                 General transfer comment: deferred due to deconditioned status    Balance                                           ADL either performed or assessed with clinical judgement   ADL Overall ADL's : Needs assistance/impaired Eating/Feeding: Modified independent;Bed level   Grooming: Moderate assistance;Bed level   Upper Body Bathing: Set up;Bed level   Lower Body Bathing: Total assistance;Bed  level   Upper Body Dressing : Moderate assistance;Bed level   Lower Body Dressing: Total assistance;Sitting/lateral leans;Bed level     Toilet Transfer Details (indicate cue type and reason): deferred as pt's O2 very high on 15L and pt desatting into mid 80s  Toileting- Clothing Manipulation and Hygiene: Maximal assistance;Bed level Toileting - Clothing Manipulation Details (indicate cue type and reason): staff reports that pt was transferring to [facility] with supervisionA, but recently SOB and requiring 15 L O2.     Functional mobility during ADLs: Moderate assistance General ADL Comments: Rolling in bed side to side with modA overall to facilitate movement; pt requiring assist with all ADL other than set-upA for feeding/grooming due to fatigue.     Vision Baseline Vision/History: No visual deficits Patient Visual Report: No change from baseline Vision Assessment?: No apparent visual deficits     Perception     Praxis      Pertinent Vitals/Pain Pain Assessment: Faces Faces Pain Scale: Hurts a little bit Pain Location: LLE residual limb with movement Pain Descriptors / Indicators: Discomfort Pain Intervention(s): Monitored during session     Hand Dominance Right   Extremity/Trunk Assessment Upper Extremity Assessment Upper Extremity Assessment: Overall WFL for tasks assessed   Lower Extremity Assessment Lower Extremity Assessment: Defer to PT evaluation LLE Deficits / Details: s/p AKA       Communication Communication Communication:  No difficulties   Cognition Arousal/Alertness: Awake/alert Behavior During Therapy: WFL for tasks assessed/performed Overall Cognitive Status: Within Functional Limits for tasks assessed                                 General Comments: Pt very motivated, but limited by 15L O2.   General Comments  Pt currently on lasix and pt had gotten up so many times today to [facility] that she required 15L O2 [place] and pt desatting to mid 80s  with rolling in bed for bathing routine.    Exercises     Shoulder Instructions      Home Living Family/patient expects to be discharged to:: Private residence Living Arrangements: Other relatives(niece helps) Available Help at Discharge: Family;Available PRN/intermittently Type of Home: House Home Access: Stairs to enter [employer] of Steps: back porch - 2 Entrance Stairs-Rails: None Home Layout: One level     Bathroom Shower/Tub: Teacher, early years/pre: Standard Bathroom Accessibility: No   Home Equipment: Wheelchair - [employer] - 2 wheels;Cane - single point;Crutches          Prior Functioning/Environment Level of Independence: Independent with assistive device(s)  Gait / Transfers Assistance Needed: Pt reports transferring with staff independently; using w/c at facility supervised transfers ADL's / Homemaking Assistance Needed: Pt reports performing own ADL tasks in sitting. Pt able to stabilize for pericare per pt.            OT Problem List: Decreased activity tolerance;Decreased safety awareness;Pain;Increased edema;Cardiopulmonary status limiting activity;Decreased strength;Impaired balance (sitting and/or standing)      OT Treatment/Interventions: Self-care/ADL training;DME and/or AE instruction;Patient/family education;Balance training;Therapeutic exercise;Energy conservation    OT Goals(Current goals can be found in the care plan section) Acute Rehab OT Goals Patient Stated Goal: go to rehab and go home so I can take care of my family OT Goal Formulation: With patient Time For Goal Achievement: 11/06/19 Potential to Achieve Goals: Good ADL Goals Pt Will Perform Grooming: with set-up;sitting Pt Will Transfer to Toilet: with min guard assist;bedside commode Pt Will Perform Toileting - Clothing Manipulation and hygiene: with min guard assist;sit to/from stand Pt/caregiver will Perform Home Exercise Program: Increased strength;Both  right and left upper extremity Additional ADL Goal #1: Pt will increase to modified independence for bed mobility (HOB down, no rail, increased time). Additional ADL Goal #2: Pt will increase to supervisionA for OOB ADL tasks with O2 sats >90% throughout exertion.  OT Frequency: Min 2X/week   Barriers to D/C: Decreased caregiver support          Co-evaluation              AM-PAC OT "6 Clicks" Daily Activity     Outcome Measure Help from another person eating meals?: None Help from another person taking care of personal grooming?: A Lot Help from another person toileting, which includes using toliet, bedpan, or urinal?: A Lot Help from another person bathing (including washing, rinsing, drying)?: A Lot Help from another person to put on and taking off regular upper body clothing?: A Little Help from another person to put on and taking off regular lower body clothing?: A Lot 6 Click Score: 15   End of Session Equipment Utilized During Treatment: Oxygen Nurse Communication: Mobility status  Activity Tolerance: Patient limited by fatigue;Treatment limited secondary to medical complications (Comment) Patient left: in bed;with call bell/phone within reach;with nursing/sitter in room  OT Visit Diagnosis: Unsteadiness  on feet (R26.81);Pain Pain - Right/Left: Left Pain - part of body: Leg                Time: 1410-1432 OT Time Calculation (min): 22 min Charges:  OT General Charges $OT Visit: 1 Visit OT Evaluation $OT Eval Moderate Complexity: 1 Mod  Jefferey Pica, OTR/L Acute Rehabilitation Services Pager: [phone] Office: [phone]   Oval Moralez C 10/23/2019, 3:25 PM

## 2019-10-23 NOTE — Progress Notes (Signed)
Subjective: Pt seen at the bedside this AM. Feeling well, remains on BiPAP. Intermittently taken off yesterday for feeding. Still endorsing productive cough with yellow sputum. Denies shortness of breath. Discussed weaning off BiPAP today and continuing with antibiotics. Pt understanding. All questions and concerns addressed.  Objective:  Vital signs in last 24 hours: Vitals:   10/22/19 2028 10/22/19 2347 10/22/19 2348 10/23/19 0437  BP: (!) 115/53  (!) 142/78 132/77  Pulse: 90  (!) 102 93  Resp: (!) 21  (!) 26 19  Temp:   98.8 F (37.1 C) 99.1 F (37.3 C)  TempSrc:  Oral Oral Axillary  SpO2: 100%  95% 99%  Weight:      Height:       Physical Exam Vitals and nursing note reviewed.  Constitutional:      General: She is not in acute distress.    Appearance: She is not ill-appearing.  Pulmonary:     Effort: Pulmonary effort is normal. No respiratory distress.     Comments: On BiPAP. O2 saturation 97%. Bibasilar crackles. Good air movement. No rhonchi or wheeze. Musculoskeletal:     Comments: Unchanged pitting edema in RLE.  Skin:    General: Skin is warm and dry.     Comments: L AKA surgical wound with staples in place. No erythema or drainage.  Neurological:     Mental Status: She is alert.    Assessment/Plan:  Principal Problem:   Acute respiratory failure with hypoxia (HCC) Active Problems:   Acute on chronic renal failure (HCC)   Controlled type 2 diabetes mellitus (HCC)   OSA (obstructive sleep apnea)   Heart failure with preserved ejection fraction (HCC)   Hyperkalemia   Symptomatic anemia   Anemia   Hx of AKA (above knee amputation), left (HCC)   Hypoxia   Assessment and Plan: [name] is a 44 yo female with a PMHx notable for CKD III-IV, GERD, HTN, hypothyroidism, HLD, OSA on CPAP, DMII, necrotizing fasciitis, obesity, and recent AKA who presented from [facility] SNF due to anemia of <6g/dL also noting some mild dyspnea starting the day  before.  Acute respiratory failure w/ hypoxia: HAP: HFpEF: Likely multifactorial, though HAP seems to be driving the clinical picture at this point. Tmax 101.2 on 6/1. CXR read as extensive bilateral infiltrates representing pneumonia vs pulmonary edema. Echo with preserved LVEF of 60-65%, no wall motion abnormalities, and elevated RA pressures to 65mmHg. - afebrile x 24hr - net negative 1.3L yesterday - wean BiPAP today - continue antibiotics with zyvox and cefepime (day 2 of 7) - lasix 40mg  IV BID today - holding pt's home prozac and elavil while on zyvox - tylenol PRN  Acute on chronic renal failure: Hyperkalemia: Cr 2.69 on admission, up from 2.10 ten days prior. Etiology uncertain, though likely driven by volume overload. - Cr holding at 2.57  - K 5.4 - repeat lasix dosing today as above  - resume home BID lokelma  Symptomatic normocytic anemia: Hgb 5.7 on admission. Likely from acute surgical blood loss. B12 low-normal and folate normal. Low-normal ferritin at 64, with iron low at 11 and low saturation ratio. Absolute retic count of 1.8 indicating insufficient bone marrow response. S/p 2u pRBCs. - Hgb stable at >7 - continue to monitor - no signs of active bleeding, resume VTE ppx - would recommend iron and B12 outpatient supplementation   DMII: Last A1c 8.5% in April this year. On Victoza 1.8mg  daily.  - continue home Victoza - daily  CBG  Diet - heart healthy/carb modified Fluids - none DVT ppx - enoxaparin  CODE STATUS - FULL CODE  Prior to Admission Living Arrangement: SNF Anticipated Discharge Location: SNF Barriers to Discharge: clinical improvement and PT eval Dispo: Anticipated discharge in approximately 2-3 day(s).   [name], MD 10/23/2019, 6:23 AM Pager: [phone]

## 2019-10-23 NOTE — Progress Notes (Signed)
PT Cancellation Note  Patient Details Name: [name] [name] MRN: [id number] DOB: 1976/01/07   Cancelled Treatment:    Reason Eval/Treat Not Completed: Fatigue/lethargy limiting ability to participate - Per OT, pt desatting with minimal activity on 15LO2, and pt is very fatigued. PT to check back tomorrow.   [place] Pager [phone]  Office [phone]    Roxine Caddy D Elonda Husky 10/23/2019, 3:35 PM

## 2019-10-23 NOTE — Progress Notes (Signed)
Pt had gotten up for bedside commode and desatted down to 84%. Pt had a problem getting sats back up. RT asked pt if she wanted to go back on the BiPAP and she said no that she felt fine. RT placed pt on a HFNC ([place]) at 12L. Pt sats came up to 95-97% and tolerating well at this time. RT will continue to monitor pt status and wean O2 as needed.

## 2019-10-23 NOTE — Progress Notes (Signed)
Spoke w/ Aunt of pt. Her name is Orvis Brill [phone] She was inquiring on the pt's status, labs, requesting Echo results and why certain antibiotics were being ordered etc.  Requested a call from MD. States as early as 6:30am is okay.

## 2019-10-23 NOTE — Progress Notes (Signed)
Pt was taken off BiPAP this morning and placed on 4L [place]. Pt states she doesn't feel SOB and feels fine at this time. Pt tolerating the 4L [place] well with saturations of 97%. Pt breath sounds are diminished at this time. Pt able to speak clearly with no distress noted. Pt informed if she starts to feel SOB or tired to let her nurse know and they will contact this RT about placing pt back on BiPAP machine, pt expresses understanding. RT will continue to monitor pt status.

## 2019-10-24 ENCOUNTER — Encounter: Payer: Medicare HMO | Admitting: Internal Medicine

## 2019-10-24 LAB — BASIC METABOLIC PANEL
Anion gap: 9 (ref 5–15)
BUN: 67 mg/dL — ABNORMAL HIGH (ref 6–20)
CO2: 22 mmol/L (ref 22–32)
Calcium: 8.5 mg/dL — ABNORMAL LOW (ref 8.9–10.3)
Chloride: 100 mmol/L (ref 98–111)
Creatinine, Ser: 2.6 mg/dL — ABNORMAL HIGH (ref 0.44–1.00)
GFR calc Af Amer: 25 mL/min — ABNORMAL LOW (ref >60)
GFR calc non Af Amer: 22 mL/min — ABNORMAL LOW (ref >60)
Glucose, Bld: 195 mg/dL — ABNORMAL HIGH (ref 70–99)
Potassium: 4.8 mmol/L (ref 3.5–5.1)
Sodium: 131 mmol/L — ABNORMAL LOW (ref 135–145)

## 2019-10-24 LAB — CBC
HCT: 22.9 % — ABNORMAL LOW (ref 36.0–46.0)
Hemoglobin: 7 g/dL — ABNORMAL LOW (ref 12.0–15.0)
MCH: 24.9 pg — ABNORMAL LOW (ref 26.0–34.0)
MCHC: 30.6 g/dL (ref 30.0–36.0)
MCV: 81.5 fL (ref 80.0–100.0)
Platelets: 234 10*3/uL (ref 150–400)
RBC: 2.81 MIL/uL — ABNORMAL LOW (ref 3.87–5.11)
RDW: 16.1 % — ABNORMAL HIGH (ref 11.5–15.5)
WBC: 9.5 10*3/uL (ref 4.0–10.5)
nRBC: 0 % (ref 0.0–0.2)

## 2019-10-24 LAB — GLUCOSE, CAPILLARY
Glucose-Capillary: 160 mg/dL — ABNORMAL HIGH (ref 70–99)
Glucose-Capillary: 236 mg/dL — ABNORMAL HIGH (ref 70–99)
Glucose-Capillary: 243 mg/dL — ABNORMAL HIGH (ref 70–99)
Glucose-Capillary: 230 mg/dL — ABNORMAL HIGH (ref 70–99)

## 2019-10-24 MED ORDER — DICLOFENAC SODIUM 1 % EX GEL
4.0000 g | Freq: Four times a day (QID) | CUTANEOUS | Status: DC
  Administered 2019-10-24 – 2019-10-30 (×7): 4 g via TOPICAL
  Filled 2019-10-24: qty 100

## 2019-10-24 MED ORDER — INSULIN ASPART 100 UNIT/ML ~~LOC~~ SOLN
0.0000 [IU] | Freq: Every day | SUBCUTANEOUS | Status: DC
  Administered 2019-10-24: 2 [IU] via SUBCUTANEOUS
  Administered 2019-10-27 – 2019-10-29 (×2): 3 [IU] via SUBCUTANEOUS

## 2019-10-24 MED ORDER — ENOXAPARIN SODIUM 80 MG/0.8ML ~~LOC~~ SOLN
65.0000 mg | SUBCUTANEOUS | Status: DC
  Administered 2019-10-24 – 2019-10-26 (×3): 65 mg via SUBCUTANEOUS
  Filled 2019-10-24 (×3): qty 0.8

## 2019-10-24 MED ORDER — FUROSEMIDE 10 MG/ML IJ SOLN
40.0000 mg | Freq: Two times a day (BID) | INTRAMUSCULAR | Status: AC
  Administered 2019-10-24 (×2): 40 mg via INTRAVENOUS
  Filled 2019-10-24 (×2): qty 4

## 2019-10-24 MED ORDER — INSULIN ASPART 100 UNIT/ML ~~LOC~~ SOLN
0.0000 [IU] | Freq: Three times a day (TID) | SUBCUTANEOUS | Status: DC
  Administered 2019-10-24 – 2019-10-25 (×2): 3 [IU] via SUBCUTANEOUS
  Administered 2019-10-25: 2 [IU] via SUBCUTANEOUS
  Administered 2019-10-25: 1 [IU] via SUBCUTANEOUS
  Administered 2019-10-26: 2 [IU] via SUBCUTANEOUS
  Administered 2019-10-26: 5 [IU] via SUBCUTANEOUS
  Administered 2019-10-26: 1 [IU] via SUBCUTANEOUS
  Administered 2019-10-27 (×3): 2 [IU] via SUBCUTANEOUS
  Administered 2019-10-28: 3 [IU] via SUBCUTANEOUS
  Administered 2019-10-28 – 2019-10-29 (×3): 2 [IU] via SUBCUTANEOUS
  Administered 2019-10-29 – 2019-10-30 (×2): 1 [IU] via SUBCUTANEOUS
  Administered 2019-10-30: 3 [IU] via SUBCUTANEOUS
  Administered 2019-10-31: 2 [IU] via SUBCUTANEOUS
  Administered 2019-10-31: 1 [IU] via SUBCUTANEOUS
  Administered 2019-10-31: 2 [IU] via SUBCUTANEOUS

## 2019-10-24 NOTE — Progress Notes (Signed)
Called pt's aunt, [name], at [phone]. Updated on pt's progress. Discussed improved respiratory status on antibiotics and diuretics. Melissa was worried pt was confused and declining as Stephannie told her aunt yesterday that she was feeling confused and not like herself. Explained that this could certainly be related to deconditioning related to the pt's acute illness in addition to recent hospitalizations. Reassured Melissa that Roxanna Mew is improving and the medical team is looking at CIR vs SNF for dispo planning. Aunt expressed understanding and was grateful for the call. All questions and concerns addressed.

## 2019-10-24 NOTE — Progress Notes (Signed)
Subjective:  Pt seen at the bedside this morning. Feeling well, comfortable on 8L HFNC. Denies chest pain, shortness of breath, or fever. Continues to endorse productive cough. States she feels fatigued and unlike herself. Also notes L shoulder pain that began gradually yesterday while laying down in bed.  Objective:  Vital signs in last 24 hours: Vitals:   10/23/19 2034 10/24/19 0027 10/24/19 0410 10/24/19 0546  BP: (!) 143/79 (!) 170/99 (!) 148/80   Pulse: 98 97 99   Resp: 18 (!) 23 20   Temp: 97.9 F (36.6 C) 97.6 F (36.4 C) 98.8 F (37.1 C)   TempSrc: Axillary Oral Oral   SpO2: 93% 94% 94%   Weight:    (!) 136.1 kg  Height:       Physical Exam Vitals and nursing note reviewed.  Constitutional:      General: She is not in acute distress.    Appearance: She is obese. She is ill-appearing (chronically).  Cardiovascular:     Rate and Rhythm: Normal rate and regular rhythm.     Heart sounds: Normal heart sounds.  Pulmonary:     Comments: On 8L HFNC. O2 sat 98%. Normal respiratory effort. Good air movement. Rhonchi resolved. Improved bibasilar crackles. Musculoskeletal:       Arms:     Comments: Full strength and ROM in LUE. No skin changes overlying left shoulder. Persistent RLE pitting edema to midshin.   Skin:    General: Skin is warm and dry.     Comments: L AKA surgical site clean, dry, and intact.  Neurological:     Mental Status: She is alert.    Assessment/Plan:  Principal Problem:   Acute respiratory failure with hypoxia (HCC) Active Problems:   Acute on chronic renal failure (HCC)   Controlled type 2 diabetes mellitus (HCC)   OSA (obstructive sleep apnea)   Heart failure with preserved ejection fraction (HCC)   Hyperkalemia   Symptomatic anemia   Anemia   Hx of AKA (above knee amputation), left ([place])   Hypoxia  Assessment and Plan: [name] [name] is a 44 yo female with a PMHx notable for CKD III-IV, GERD, HTN, hypothyroidism, HLD, OSA on CPAP, DMII,  necrotizing fasciitis, obesity, and recent AKA who presented from [facility] SNF due to anemia of <6g/dL also noting some mild dyspnea starting the day before.  Acute respiratory failure w/ hypoxia - improving Multifactorial due to HAP and HFpEF: Tmax 101.2 on 6/1 after discharge from hospital on 5/28 after L AKA. CXR read as extensive bilateral infiltrates representing pneumonia vs pulmonary edema. Echo with preserved LVEF of 60-65%, no wall motion abnormalities, and elevated RA pressures to 64mmHg. - afebrile x 48hr - net negative 2.9L yesterday, net -4.2L this admission - oxygen requirement decreasing, currently on 8L HFNC - continue antibiotics with zyvox and cefepime (day 3 of 7) - lasix 40mg  IV BID - holding pt's home prozac and elavil while on zyvox - tylenol PRN  CKD stage IV: Hyperkalemia: Cr 2.69 on admission, up from 2.10 ten days prior.Etiology uncertain, though likely driven by volume overload. - AM labs stable K 4.8 and Cr 2.60 - in review of previous labs baseline Cr around 2.3-2.6 - continue to monitor with diuresis - repeat lasix dosing today as above  - on home BID lokelma  Symptomatic normocytic anemia: Hgb 5.7 on admission. Likely from acute surgical blood loss and CKD. B12 low-normal and folate normal. Low-normal ferritin at 64, with iron low at 11 and low  saturation ratio. Absolute retic count of 1.8 indicating insufficient bone marrow response. S/p 2u pRBCs. - Hgb stable at 7 this AM - continue to monitor - would recommend iron and B12 outpatient supplementation  L shoulder pain Full strength and ROM intact. Tender to palpation over biceps tendon. Avoid systemic NSAIDs in CKD pt. - Voltaren gel - continue to monitor  DMII: Last A1c 8.5% in April this year. On Victoza 1.8mg  daily.  -continuehomeVictoza -daily CBG  Diet - heart healthy/carb modified Fluids - none DVT ppx- enoxaparin  CODE STATUS - FULL CODE  Prior to Admission Living  Arrangement: SNF Anticipated Discharge Location: CIR Barriers to Discharge: clinical improvement Dispo: Anticipated discharge in approximately 1-2 day(s).   [name] Horns, MD 10/24/2019, 7:15 AM Pager: [phone]

## 2019-10-24 NOTE — Progress Notes (Signed)
Chaplain responded to consult. The patient was currently not available. The chaplain will follow-up later.  Brion Aliment Chaplain Resident For questions concerning this note please contact me by pager [phone]

## 2019-10-24 NOTE — Evaluation (Signed)
Physical Therapy Evaluation Patient Details Name: [name] [name] MRN: [id number] DOB: [birth year] Today's Date: 10/24/2019   History of Present Illness  Pt is a 44 yo female s/p recent LLE AKA on 5/26 admitted for bilateral infiltrates with hypoxia. PMHx:DM2, obesity, and bipolar disorder.  Clinical Impression  Pt very pleasant supine on arrival on 7L HFNC with SpO2 93% with drop to 85% with transition to sitting. Increased HFNC to 10L for mobility with pt able to maintain 88-98% with stand pivots and left on 8L at rest end of session. Pt with decreased activity tolerance, function and cardiopulmonary status who will benefit from acute therapy to maximize mobility, safety and function to decrease burden of care. Encouraged OOB daily with nursing staff.      Follow Up Recommendations Supervision for mobility/OOB;SNF    Equipment Recommendations  Hospital bed    Recommendations for Other Services       Precautions / Restrictions Precautions Precautions: Fall Precaution Comments: LLE AKA Restrictions Weight Bearing Restrictions: Yes LLE Weight Bearing: Non weight bearing      Mobility  Bed Mobility Overal bed mobility: Modified Independent Bed Mobility: Supine to Sit     Supine to sit: Modified independent (Device/Increase time)     General bed mobility comments: HOB 15 degrees with rail and increased time  Transfers Overall transfer level: Needs assistance   Transfers: Sit to/from Stand;Stand Pivot Transfers Sit to Stand: Min guard Stand pivot transfers: Min guard       General transfer comment: guarding for lines and safety with pt able to stand from bed, scoot foot with RW to pivot to chair then stand from chair to pivot to [facility]. pt reports not feeling confidant enough to hop  Ambulation/Gait                Stairs            Wheelchair Mobility    Modified Rankin (Stroke Patients Only)       Balance Overall balance assessment: Needs  assistance   Sitting balance-Leahy Scale: Good       Standing balance-Leahy Scale: Poor Standing balance comment: bil UE support with RW                             Pertinent Vitals/Pain Pain Assessment: No/denies pain    Home Living Family/patient expects to be discharged to:: Private residence Living Arrangements: Other relatives Available Help at Discharge: Family;Available PRN/intermittently Type of Home: House Home Access: Stairs to enter   [employer] of Steps: back porch - 2 Home Layout: One level Home Equipment: Wheelchair - [employer] - 2 wheels;Cane - single point;Crutches Additional Comments: pt reports she was ambulatory with prosthesis prior to AKA and since then has been transferring to [facility] and moving about at SNF    Prior Function     Gait / Transfers Assistance Needed: Pt reports transferring with staff independently; using w/c at facility supervised transfers  ADL's / Homemaking Assistance Needed: Pt reports performing own ADL tasks in sitting. Pt able to stabilize for pericare per pt.        Hand Dominance        Extremity/Trunk Assessment   Upper Extremity Assessment Upper Extremity Assessment: Overall WFL for tasks assessed    Lower Extremity Assessment Lower Extremity Assessment: LLE deficits/detail LLE Deficits / Details: AKA       Communication   Communication: No difficulties  Cognition Arousal/Alertness: Awake/alert Behavior During  Therapy: WFL for tasks assessed/performed Overall Cognitive Status: Within Functional Limits for tasks assessed                                        General Comments      Exercises     Assessment/Plan    PT Assessment Patient needs continued PT services  PT Problem List Decreased strength;Decreased mobility;Decreased range of motion;Decreased balance;Decreased activity tolerance;Decreased knowledge of use of DME;Decreased knowledge of precautions;Decreased  safety awareness       PT Treatment Interventions Therapeutic activities;DME instruction;Gait training;Therapeutic exercise;Patient/family education;Balance training;Stair training;Functional mobility training    PT Goals (Current goals can be found in the Care Plan section)  Acute Rehab PT Goals Patient Stated Goal: be able to sleep in a bed and get used to moving to go home PT Goal Formulation: With patient Time For Goal Achievement: 11/07/19 Potential to Achieve Goals: Good    Frequency Min 3X/week   Barriers to discharge Decreased caregiver support;Inaccessible home environment      Co-evaluation               AM-PAC PT "6 Clicks" Mobility  Outcome Measure Help needed turning from your back to your side while in a flat bed without using bedrails?: A Little Help needed moving from lying on your back to sitting on the side of a flat bed without using bedrails?: A Little Help needed moving to and from a bed to a chair (including a wheelchair)?: A Little Help needed standing up from a chair using your arms (e.g., wheelchair or bedside chair)?: A Little Help needed to walk in hospital room?: A Lot Help needed climbing 3-5 steps with a railing? : Total 6 Click Score: 15    End of Session Equipment Utilized During Treatment: Gait belt Activity Tolerance: Patient tolerated treatment well Patient left: with call bell/phone within reach;Other (comment)(on BSC with RN aware) Nurse Communication: Mobility status PT Visit Diagnosis: Other abnormalities of gait and mobility (R26.89);Difficulty in walking, not elsewhere classified (R26.2);Pain;Muscle weakness (generalized) (M62.81)    Time: 0752-0820 PT Time Calculation (min) (ACUTE ONLY): 28 min   Charges:   PT Evaluation $PT Eval Moderate Complexity: 1 Mod          Chrishelle Zito P, PT Acute Rehabilitation Services Pager: [phone] Office: [phone]   Zeth Buday B Sadiel Mota 10/24/2019, 11:22 AM

## 2019-10-24 NOTE — Progress Notes (Addendum)
Inpatient Diabetes Program Recommendations  AACE/ADA: New Consensus Statement on Inpatient Glycemic Control   Target Ranges:  Prepandial:   less than 140 mg/dL      Peak postprandial:   less than 180 mg/dL (1-2 hours)      Critically ill patients:  140 - 180 mg/dL  Results for [name] [name], [name] [name] (MRN [id number]) as of 10/24/2019 13:17  Ref. Range 10/23/2019 06:55 10/23/2019 18:51 10/24/2019 05:56 10/24/2019 11:54  Glucose-Capillary Latest Ref Range: 70 - 99 mg/dL 156 (H) 194 (H) 160 (H) 236 (H)    Review of Glycemic Control  Diabetes history: DM2 Outpatient Diabetes medications: Victoza 1.8 mg daily, Humalog 2-10 units TID with meals Current orders for Inpatient glycemic control: None  Inpatient Diabetes Program Recommendations:   Correction (SSI): Please consider ordering CBGs AC&HS with Novolog 0-9 units TID with meals and Novolog 0-5 units QHS.  Thanks, Barnie Alderman, RN, MSN, CDE Diabetes Coordinator Inpatient Diabetes Program [phone] (Team Pager from 8am to 5pm)

## 2019-10-24 NOTE — TOC Initial Note (Signed)
Transition of Care [facility]) - Initial/Assessment Note    Patient Details  Name: [name] [name] MRN: [id number] Date of Birth: [birth year]  Transition of Care [facility]) CM/SW Contact:    Vinie Sill, [place] Phone Number: 10/24/2019, 12:48 PM  Clinical Narrative:                  CSW visit with patient at bedside. CSW introduced self and explained role. Patient confirmed she was form [employer]. Patient states is agreeable to returning to [facility] when ready for discharge. Patient states no questions or concerns at this time.   CSW will continue to follow and assist with discharge planning.  Thurmond Butts, MSW, [place] [facility] Clinical Social Worker   Expected Discharge Plan: Skilled Nursing Facility Barriers to Discharge: Continued Medical Work up   Patient Goals and CMS Choice        Expected Discharge Plan and Services Expected Discharge Plan: [place] In-house Referral: Clinical Social Work     Living arrangements for the past 2 months: [place]                                      Prior Living Arrangements/Services Living arrangements for the past 2 months: [place] Lives with:: Self, Facility Resident Patient language and need for interpreter reviewed:: No        Need for Family Participation in Patient Care: Yes (Comment) Care giver support system in place?: Yes (comment)   Criminal Activity/Legal Involvement Pertinent to Current Situation/Hospitalization: No - Comment as needed  Activities of Daily Living Home Assistive Devices/Equipment: Environmental consultant (specify type), Wheelchair ADL Screening (condition at time of admission) Patient's cognitive ability adequate to safely complete daily activities?: Yes Is the patient deaf or have difficulty hearing?: No Does the patient have difficulty seeing, even when wearing glasses/contacts?: No Does the patient have difficulty concentrating, remembering, or making  decisions?: No Patient able to express need for assistance with ADLs?: Yes Does the patient have difficulty dressing or bathing?: No Independently performs ADLs?: Yes (appropriate for developmental age) Does the patient have difficulty walking or climbing stairs?: Yes Weakness of Legs: Right Weakness of Arms/Hands: None  Permission Sought/Granted Permission sought to share information with : Family Supports Permission granted to share information with : Yes, Verbal Permission Granted  Share Information with NAME: Oncologist  Permission granted to share info w AGENCY: SNFs  Permission granted to share info w Relationship: sister  Permission granted to share info w Contact Information: [phone]  Emotional Assessment Appearance:: Appears older than stated age   Affect (typically observed): Accepting, Appropriate Orientation: : Oriented to Self, Oriented to Place, Oriented to  Time, Oriented to Situation Alcohol / Substance Use: Not Applicable Psych Involvement: No (comment)  Admission diagnosis:  Anemia [D64.9] Hypoxia [R09.02] Acute respiratory failure with hypoxia (HCC) [J96.01] Hx of AKA (above knee amputation), left (HCC) [Y69.485] Symptomatic anemia [D64.9] Anemia, unspecified type [D64.9] Patient Active Problem List   Diagnosis Date Noted  . Hx of AKA (above knee amputation), left ([place])   . Hypoxia   . Symptomatic anemia 10/21/2019  . Anemia 10/21/2019  . Acute respiratory failure with hypoxia ([place]) 10/21/2019  . Blurry vision, right eye 10/08/2019  . Wound dehiscence 09/04/2019  . Right carpal tunnel syndrome 06/14/2019  . Left carpal tunnel syndrome 06/14/2019  . Tobacco use 03/01/2019  . Phantom limb pain ([place]) 11/08/2018  .  Current moderate episode of major depressive disorder ([place])   . Hyperkalemia   . Anemia of chronic disease   . Severe protein-calorie malnutrition ([place])   . Dehiscence of amputation stump ([place])   . Fall   . Status post below knee  amputation, left ([place]) 04/11/2017  . Bipolar affective disorder ([place])   . Adjustment disorder with depressed mood 03/26/2017  . Heart failure with preserved ejection fraction ([place]) 02/09/2017  . Routine adult health maintenance 12/08/2016  . Chronic pain of right knee 09/08/2016  . Chronic kidney disease (CKD), stage III (moderate) 07/15/2016  . OSA (obstructive sleep apnea) 05/13/2016  . Morbid obesity due to excess calories ([place]) 11/27/2015  . Hypothyroidism 04/25/2013  . Acute on chronic renal failure ([place]) 07/12/2012  . Controlled type 2 diabetes mellitus ([place]) 07/12/2012  . Carpal tunnel syndrome, bilateral 01/25/2012  . Diabetic polyneuropathy associated with type 2 diabetes mellitus ([place]) 07/04/2011  . Anxiety and depression 06/02/2011  . GERD 12/06/2007  . Hyperlipidemia 08/29/2007  . Hypertension associated with diabetes ([place]) 08/29/2007   PCP:  Ina Homes, MD Pharmacy:  No Pharmacies Listed    Social Determinants of Health (SDOH) Interventions    Readmission Risk Interventions Readmission Risk Prevention Plan 10/14/2019  Transportation Screening Complete  PCP or Specialist Appt within 3-5 Days Not Complete  Not Complete comments plan for rehab  [place] or [place] Complete  Social Work Consult for [place] Planning/Counseling Complete  Palliative Care Screening Not Applicable  Medication Review (RN Care Manager) Referral to Pharmacy  Some recent data might be hidden

## 2019-10-25 ENCOUNTER — Inpatient Hospital Stay: Payer: Medicare HMO | Admitting: Family

## 2019-10-25 ENCOUNTER — Encounter (HOSPITAL_COMMUNITY): Payer: Self-pay | Admitting: Internal Medicine

## 2019-10-25 DIAGNOSIS — E1159 Type 2 diabetes mellitus with other circulatory complications: Secondary | ICD-10-CM

## 2019-10-25 LAB — CBC
HCT: 18.5 % — ABNORMAL LOW (ref 36.0–46.0)
Hemoglobin: 6 g/dL — CL (ref 12.0–15.0)
MCH: 26.3 pg (ref 26.0–34.0)
MCHC: 32.4 g/dL (ref 30.0–36.0)
MCV: 81.1 fL (ref 80.0–100.0)
Platelets: 223 10*3/uL (ref 150–400)
RBC: 2.28 MIL/uL — ABNORMAL LOW (ref 3.87–5.11)
RDW: 16 % — ABNORMAL HIGH (ref 11.5–15.5)
WBC: 6.6 10*3/uL (ref 4.0–10.5)
nRBC: 0.3 % — ABNORMAL HIGH (ref 0.0–0.2)

## 2019-10-25 LAB — HEMOGLOBIN AND HEMATOCRIT, BLOOD
HCT: 22.8 % — ABNORMAL LOW (ref 36.0–46.0)
Hemoglobin: 7.2 g/dL — ABNORMAL LOW (ref 12.0–15.0)

## 2019-10-25 LAB — BASIC METABOLIC PANEL
Anion gap: 10 (ref 5–15)
BUN: 68 mg/dL — ABNORMAL HIGH (ref 6–20)
CO2: 22 mmol/L (ref 22–32)
Calcium: 8.5 mg/dL — ABNORMAL LOW (ref 8.9–10.3)
Chloride: 97 mmol/L — ABNORMAL LOW (ref 98–111)
Creatinine, Ser: 2.77 mg/dL — ABNORMAL HIGH (ref 0.44–1.00)
GFR calc Af Amer: 23 mL/min — ABNORMAL LOW (ref >60)
GFR calc non Af Amer: 20 mL/min — ABNORMAL LOW (ref >60)
Glucose, Bld: 142 mg/dL — ABNORMAL HIGH (ref 70–99)
Potassium: 4.3 mmol/L (ref 3.5–5.1)
Sodium: 129 mmol/L — ABNORMAL LOW (ref 135–145)

## 2019-10-25 LAB — PREPARE RBC (CROSSMATCH)

## 2019-10-25 LAB — GLUCOSE, CAPILLARY
Glucose-Capillary: 143 mg/dL — ABNORMAL HIGH (ref 70–99)
Glucose-Capillary: 218 mg/dL — ABNORMAL HIGH (ref 70–99)
Glucose-Capillary: 153 mg/dL — ABNORMAL HIGH (ref 70–99)

## 2019-10-25 MED ORDER — BUMETANIDE 1 MG PO TABS
1.0000 mg | ORAL_TABLET | Freq: Two times a day (BID) | ORAL | Status: DC
  Administered 2019-10-25 – 2019-10-26 (×3): 1 mg via ORAL
  Filled 2019-10-25 (×3): qty 1

## 2019-10-25 MED ORDER — SODIUM CHLORIDE 0.9% IV SOLUTION: Freq: Once | INTRAVENOUS | Status: AC

## 2019-10-25 MED ORDER — SODIUM CHLORIDE 0.9 % IV SOLN
510.0000 mg | Freq: Once | INTRAVENOUS | Status: AC
  Administered 2019-10-25: 510 mg via INTRAVENOUS
  Filled 2019-10-25: qty 17

## 2019-10-25 NOTE — Progress Notes (Signed)
Subjective:  Pt seen at the bedside this AM. On BiPAP with O2 sat at 100, requesting to come off of it. She is concerned regarding her hemoglobin continuously dropping. Discussed anemia related to CKD, Fe deficiency, and surgical blood loss. She is unaware of melena, though denies blood in stools, hemoptysis, or evidence of any bleeding elsewhere. Denies any chest pain or shortness of breath. Still has some left shoulder pain this morning. All questions and concerns addressed.   Objective:  Vital signs in last 24 hours: Vitals:   10/25/19 0010 10/25/19 0357 10/25/19 0416 10/25/19 0500  BP:   112/68   Pulse: 88  76   Resp: 20 15 20    Temp:   97.6 F (36.4 C)   TempSrc:   Oral   SpO2: 90%  95%   Weight:    (!) 137.8 kg  Height:       Physical Exam Vitals and nursing note reviewed.  Constitutional:      General: She is not in acute distress.    Appearance: She is obese. She is ill-appearing (chronically).  Cardiovascular:     Rate and Rhythm: Normal rate and regular rhythm.     Heart sounds: Normal heart sounds.  Pulmonary:     Effort: Pulmonary effort is normal.     Comments: Breathing comfortably and speaking in full sentences on BiPAP with O2 sat 100. Improving bibasilar crackles. No rhonchi. Good air movement. Musculoskeletal:     Comments: Persistent pitting edema in the RLE to the mid-shin  Skin:    General: Skin is warm and dry.  Neurological:     Mental Status: She is alert.    Assessment/Plan:  Principal Problem:   Acute respiratory failure with hypoxia (HCC) Active Problems:   Acute on chronic renal failure (HCC)   Controlled type 2 diabetes mellitus (HCC)   OSA (obstructive sleep apnea)   Heart failure with preserved ejection fraction (HCC)   Hyperkalemia   Symptomatic anemia   Anemia   Hx of AKA (above knee amputation), left ([place])   Hypoxia  Assessment and Plan: Ms.Weigelt is a 44 yo female with a PMHx notable for CKD III-IV, GERD, HTN,  hypothyroidism, HLD, OSA on CPAP, DMII, necrotizing fasciitis, obesity, and recent AKA who presented from [facility] SNF due to anemia of <6g/dL also noting some mild dyspnea starting the day before.  Acute respiratory failure w/ hypoxia Multifactorial due to HAP and HFpEF: Respiratory status improving, with decreasing bibasilar crackles and good air movement. Pt on BiPAP overnight, though AM RN uncertain of indication.  - remains afebrile - wean O2 as tolerated - day 4 of 7 of antibiotics with zyvox and cefepime for HAP - unfortunately no I/Os documented on 6/3 and wt up 1.7 kg (though ?accuracy) - Cr bumped from 2.6 >> 2.77 - will resume home oral bumex 1mg  BID dosing  Symptomatic normocytic anemia: Initially attributed to acute surgical blood loss, Fe deficiency, and chronic anemia from CKD. Hgb 5.7 on admission. S/p 2u pRBCs on 5/31. Hgb holding at 7 afterwards.  - Hgb drop this AM to 6.0 - transfuse 1u pRBCs and IV iron today - f/u H&H - pt denies evidence of bleeding and vitals remain stable - if counts keep dropping, will need to investigate for areas of bleeding with FOBT, UA, or imaging.  CKD stage IV: Hyperkalemia: Cr 2.69 on admission, up from 2.10 ten days prior.In review of previous labs baseline Cr around 2.3-2.6. - continuing to monitor with  diuresis - AM Cr 2.77 and K 4.3 - d/c IV lasix today and resume home oral bumex - on home BID lokelma  L shoulder pain Full strength and ROM intact. Tender to palpation over biceps tendon. - Voltaren gel  DMII: Last A1c 8.5% in April this year. -continuehomeVictoza - SSI  Prior to Admission Living Arrangement: SNF Anticipated Discharge Location: SNF vs CIR Barriers to Discharge: clinical improvement Dispo: Anticipated discharge in approximately 1-2 day(s).   [name], MD 10/25/2019, 6:19 AM Pager: [phone]

## 2019-10-25 NOTE — Progress Notes (Signed)
Called pt's aunt, [name]. Updates provided. All questions and concerns addressed.

## 2019-10-25 NOTE — Progress Notes (Signed)
Pt refused CPAP for tonight.   Rt will return if needed.

## 2019-10-25 NOTE — Care Management Important Message (Signed)
Important Message  Patient Details  Name: BENTLEIGH WAREN MRN: [id number] Date of Birth: 02-Sep-1975   Medicare Important Message Given:  Yes     Shelda Altes 10/25/2019, 12:17 PM

## 2019-10-25 NOTE — Consult Note (Signed)
   [facility] CM Inpatient Consult   10/25/2019  [name] [name] [birth year] [id number]   [facility] [ACO] Patient: [name] and with [facility] Embedded Chronic Care Management team.  Patient screened for extreme high risk score for unplanned readmission score and for less than 7 day readmission hospitalization.  Medical record reviewed to check if potential [place].    Primary Care Provider is [facility] Internal Medicine this provider is listed to provide the transition of care [TOC] for post hospital follow up in this Embedded practice.  Plan: Will update the Embedded team. Continue to follow progress and disposition to assess for post hospital care management needs.    Please place a [facility] Care Management consult as appropriate and for questions contact:   Natividad Brood, RN BSN [facility] Liaison  [phone] business mobile phone Toll free office [phone]  Fax number: [phone] [email or web address]

## 2019-10-25 NOTE — Progress Notes (Signed)
Inpatient Rehabilitation-Admissions Coordinator   Inpatient Rehab Consult received. This patient is familiar to me as I had met with her on 5/24 (please see my note for details) during her last admission for a CIR consult as well. I met with her at the bedside this AM to see if her living situation had changed since the last time we spoke. Unfortunately it has not and due to a lack of support, AC would agree with current PT recommendation for SNF. AC will sign off and will contact TOC team regarding recommendations.   Raechel Ache, OTR/L  Rehab Admissions Coordinator  [phone] 10/25/2019 11:43 AM

## 2019-10-25 NOTE — Progress Notes (Signed)
Chaplain engaged in initial visit with [place].  During visit, [name] [name] shared her health journey with chaplain as well as the various things she has been facing.  Currently at the forefront of [name] [name]'s mind has been recovering, sticking to the doctor's prescribed diet, and finding housing.  She has been diligently looking for a place to live on her own and doing that work while in the hospital.  Chaplain commended [name] [name] for doing that work which has required her to send in information and complete paperwork, as well as commend her on her quitting smoking.  Chaplain assesses that [name] [name] has a strong will and desire to keep living.  One of the last things that [name] [name] now deceased mother told her and taught her was to continue fighting. She declared that she will get better and be able to go home.  She also shared that there are some specific people that she desires to keep living for. This journey of not getting better as quickly as she desires and not having housing as fast as she wants, has been hard for her but she appears to be committed to having both.  Chaplain offered the ministries of presence, prayer and listening.  Chaplain let [name] [name] know that she is not alone.

## 2019-10-25 NOTE — Progress Notes (Addendum)
Pharmacy Antibiotic Note  [name] [name] is a 44 y.o. female admitted on 10/21/2019 with pneumonia.  Pharmacy has been consulted for Zyvox and Cefepime dosing.  ID: Abx for HCAP. Afebrile. WBC 6.6 down. Scr up again today 2.77   Zyvox 6/1>> Cefepime 6/1>>  5/31: RVP: neg 6/1: BC x 2>>IP   Plan: - Lovenox 65mg  SQ q24h (watch CrCl). Con't despite Hgb? - Needs iron replacement when infection clears  - Start Zyvox 600mg  IV q12h on 6/1 - Cefepime 2g IV q24h - D/c Prozac and Elavil while on Zyvox due to the elevated risk of serotonin syndrome. (F/u when Zyvox d/c'd to resume Prozac and Elavil!)  - Consider reducing Lokelma if K+ con't to decline.    Height: 5\' 10"  (177.8 cm) Weight: (!) 137.8 kg (303 lb 12.7 oz) IBW/kg (Calculated) : 68.5  Temp (24hrs), Avg:97.8 F (36.6 C), Min:97.2 F (36.2 C), Max:98.7 F (37.1 C)  Recent Labs  Lab 10/21/19 1622 10/22/19 0935 10/23/19 1015 10/24/19 0238 10/25/19 0401  WBC 6.6 7.9 10.1 9.5 6.6  CREATININE 2.69* 2.54* 2.57* 2.60* 2.77*    Estimated Creatinine Clearance: 39.8 mL/min (A) (by C-G formula based on SCr of 2.77 mg/dL (H)).    Allergies  Allergen Reactions  . Vancomycin Other (See Comments)    Acute renal failure suspected secondary to [place]. Alford Highland, PharmD, BCPS Clinical Staff Pharmacist [email or web address] [place] 10/25/2019 7:49 AM

## 2019-10-25 NOTE — Progress Notes (Signed)
Occupational Therapy Treatment Patient Details Name: [name] [name] MRN: [id number] DOB: Jul 01, 1975 Today's Date: 10/25/2019    History of present illness Pt is a 44 yo female s/p recent LLE AKA on 5/26 admitted for bilateral infiltrates with hypoxia. PMHx:DM2, obesity, and bipolar disorder.   OT comments  Pt. Seen for skilled OT treatment session with focus of introduction of HEP for B UE strengthening and endurance.  Pt. Able to return demo of demonstrated exercises and maintained o2 of 95% throughout on 7L.  Motivated and positive throughout participation.    Follow Up Recommendations  CIR    Equipment Recommendations  Other (comment)    Recommendations for Other Services Rehab consult    Precautions / Restrictions Precautions Precautions: Fall Precaution Comments: LLE AKA Restrictions LLE Weight Bearing: Non weight bearing Other Position/Activity Restrictions: BKA revision to AKA       Mobility Bed Mobility               General bed mobility comments: pt. seated semi edge of bed throughout session  Transfers                      Balance                                           ADL either performed or assessed with clinical judgement   ADL                                               Vision       Perception     Praxis      Cognition Arousal/Alertness: Awake/alert Behavior During Therapy: WFL for tasks assessed/performed Overall Cognitive Status: Within Functional Limits for tasks assessed                                          Exercises General Exercises - Upper Extremity Shoulder Flexion: AROM;Strengthening;Both;20 reps;Seated Shoulder Extension: AROM;20 reps;Strengthening;Both;Seated Shoulder ABduction: AROM;Strengthening;Both;20 reps;Seated Elbow Flexion: AROM;Strengthening;Both;20 reps;Seated Elbow Extension: AROM;Strengthening;Both;20 reps;Seated   Shoulder  Instructions       General Comments      Pertinent Vitals/ Pain       Pain Assessment: No/denies pain  Home Living                                          Prior Functioning/Environment              Frequency  Min 2X/week        Progress Toward Goals  OT Goals(current goals can now be found in the care plan section)  Progress towards OT goals: Progressing toward goals     Plan Discharge plan remains appropriate    Co-evaluation                 AM-PAC OT "6 Clicks" Daily Activity     Outcome Measure   Help from another person eating meals?: None Help from another person taking care of personal grooming?: A Lot Help from another person toileting, which includes  using toliet, bedpan, or urinal?: A Lot Help from another person bathing (including washing, rinsing, drying)?: A Lot Help from another person to put on and taking off regular upper body clothing?: A Little Help from another person to put on and taking off regular lower body clothing?: A Lot 6 Click Score: 15    End of Session Equipment Utilized During Treatment: Oxygen  OT Visit Diagnosis: Unsteadiness on feet (R26.81);Pain Pain - Right/Left: Left Pain - part of body: Leg   Activity Tolerance Patient tolerated treatment well   Patient Left in bed;with call bell/phone within reach   Nurse Communication          Time: 0340-3524 OT Time Calculation (min): 11 min  Charges: OT General Charges $OT Visit: 1 Visit OT Treatments $Therapeutic Exercise: 8-22 mins  Sonia Baller, Arthur   Janice Coffin 10/25/2019, 11:56 AM

## 2019-10-26 LAB — CBC
HCT: 22.9 % — ABNORMAL LOW (ref 36.0–46.0)
Hemoglobin: 7.2 g/dL — ABNORMAL LOW (ref 12.0–15.0)
MCH: 25.2 pg — ABNORMAL LOW (ref 26.0–34.0)
MCHC: 31.4 g/dL (ref 30.0–36.0)
MCV: 80.1 fL (ref 80.0–100.0)
Platelets: 223 10*3/uL (ref 150–400)
RBC: 2.86 MIL/uL — ABNORMAL LOW (ref 3.87–5.11)
RDW: 15.3 % (ref 11.5–15.5)
WBC: 5.4 10*3/uL (ref 4.0–10.5)
nRBC: 0.6 % — ABNORMAL HIGH (ref 0.0–0.2)

## 2019-10-26 LAB — BASIC METABOLIC PANEL
Anion gap: 12 (ref 5–15)
BUN: 78 mg/dL — ABNORMAL HIGH (ref 6–20)
CO2: 22 mmol/L (ref 22–32)
Calcium: 8.7 mg/dL — ABNORMAL LOW (ref 8.9–10.3)
Chloride: 97 mmol/L — ABNORMAL LOW (ref 98–111)
Creatinine, Ser: 3.15 mg/dL — ABNORMAL HIGH (ref 0.44–1.00)
GFR calc Af Amer: 20 mL/min — ABNORMAL LOW (ref >60)
GFR calc non Af Amer: 17 mL/min — ABNORMAL LOW (ref >60)
Glucose, Bld: 147 mg/dL — ABNORMAL HIGH (ref 70–99)
Potassium: 4.6 mmol/L (ref 3.5–5.1)
Sodium: 131 mmol/L — ABNORMAL LOW (ref 135–145)

## 2019-10-26 LAB — GLUCOSE, CAPILLARY
Glucose-Capillary: 159 mg/dL — ABNORMAL HIGH (ref 70–99)
Glucose-Capillary: 139 mg/dL — ABNORMAL HIGH (ref 70–99)
Glucose-Capillary: 199 mg/dL — ABNORMAL HIGH (ref 70–99)
Glucose-Capillary: 287 mg/dL — ABNORMAL HIGH (ref 70–99)
Glucose-Capillary: 172 mg/dL — ABNORMAL HIGH (ref 70–99)

## 2019-10-26 MED ORDER — FUROSEMIDE 10 MG/ML IJ SOLN
40.0000 mg | Freq: Two times a day (BID) | INTRAMUSCULAR | Status: AC
  Administered 2019-10-26 (×2): 40 mg via INTRAVENOUS
  Filled 2019-10-26 (×2): qty 4

## 2019-10-26 NOTE — Progress Notes (Signed)
Patient refused the use of BIPAP for the evening RT will continue to monitor.  

## 2019-10-26 NOTE — Progress Notes (Signed)
Mobility Specialist - Progress Note   10/26/19 1114  Mobility  Activity Transferred:  Bed to chair  Level of Assistance Modified independent, requires aide device or extra time  Assistive Device Front wheel walker  Distance Ambulated (ft) 5 ft  Mobility Response Tolerated well  Mobility performed by Mobility specialist  $Mobility charge 1 Mobility    Pre-mobility: 72 HR, 92% SpO2 Post-mobility: 80 HR, 91% SPO2  Ambulated on 5 L/min of O2. Pt was able to stand up on her own as well as walk w/o assistance aside from the RW. She endorsed pain in her L lower residual limb when ambulating.   [place] Specialist

## 2019-10-26 NOTE — Progress Notes (Signed)
SATURATION QUALIFICATIONS: (This note is used to comply with regulatory documentation for home oxygen)  Patient Saturations on Room Air at Rest = 83%  Patient Saturations on 3 Liters of oxygen while at rest = 94%  Please briefly explain why patient needs home oxygen: Pt is unable to ambulate due to LE amputation. Pt requires 3L [place] to keep oxygen saturations above 90%.

## 2019-10-26 NOTE — Progress Notes (Addendum)
Subjective:  [name] [name] was examined and evaluated at bedside this am. She mentions feeling better overall but she continues to endorse productive cough. She mentions continuing to endorse left shoulder pain but otherwise feels well. Denies back pain, pain in her left stump, or any evidence for bleeding. She mentions that she feels her urination has not increased much with her IV furosemide. Discussed plan to discharge her to SNF [facility] [place]).  Objective:  Vital signs in last 24 hours: Vitals:   10/25/19 1600 10/25/19 2043 10/25/19 2347 10/26/19 0312  BP: (!) 90/51 123/75 129/73 135/75  Pulse:  83 76 81  Resp: 15 17 15 20   Temp: (!) 97.5 F (36.4 C) (!) 97 F (36.1 C) 98 F (36.7 C) 98.1 F (36.7 C)  TempSrc: Oral Axillary Oral Oral  SpO2: 98% 99% 92%   Weight:      Height:       Physical Exam Vitals and nursing note reviewed.  Constitutional:      General: She is not in acute distress.    Appearance: She is ill-appearing (chronically).  Cardiovascular:     Rate and Rhythm: Normal rate and regular rhythm.     Heart sounds: Normal heart sounds.  Pulmonary:     Comments: On 5L HFNC. Normal respiratory effort. No appreciated rhonchi or crackles. Musculoskeletal:     Comments: Right leg with persistent pitting edema +2 to mid-shin. Left AKA.  Skin:    General: Skin is warm and dry.     Comments: No overlying hematoma or ecchymosis appreciated in left AKA  Neurological:     Mental Status: She is alert.  Psychiatric:        Mood and Affect: Mood normal.    Assessment/Plan:  Principal Problem:   Acute respiratory failure with hypoxia (HCC) Active Problems:   Acute on chronic renal failure (HCC)   Controlled type 2 diabetes mellitus (HCC)   OSA (obstructive sleep apnea)   Heart failure with preserved ejection fraction (HCC)   Hyperkalemia   Symptomatic anemia   Anemia   Hx of AKA (above knee amputation), left ([place])   Hypoxia  Assessment and Plan: [name] [name] is  a 44 yo female with a PMHx notable for CKD III-IV, GERD, HTN, hypothyroidism, HLD, OSA on CPAP, DMII, necrotizing fasciitis, obesity, and recent AKA who presented from [facility] due to anemia of <6g/dL also noting some mild dyspnea starting the day before.  Acute respiratory failure w/ hypoxia Multifactorial due toHAPandHFpEF: Respiratory status continues to improve with decreasing oxygen requirement. Remains afebrile. - day 5 of antibiotic course for HAP with zyvox and cefepime - check pulse ox while transferring - continue to wean O2 as tolerated, though suspect pt will need some supplemental O2 upon discharge - pt net +466cc yesterday after pRBC and iron transfusions - continues to look volume up today and truly as yet to appear dry - Cr 2.6 > 2.77 > 3.15, suspect all related to vascular congestion since had initially improved with IV diuresis - has already received 1mg  bumex PO today - give additional lasix 40mg  IV TID  Symptomatic normocytic anemia: Initially attributed to acute surgical blood loss, Fe deficiency, and chronic anemia from CKD. Hgb 5.7 on admission. S/p 2u pRBCs on 5/31 and another unit on 6/4. Received IV iron on 6/4. - post transfusion H&H holding at 7.2 x 2 - no evidence for active bleeding or hematoma in left AKA site - vitals stable - recheck CBC tomorrow AM - if  counts keep dropping, will need to investigate for areas of bleeding with FOBT, UA, or imaging.  CKD stage IV: Hyperkalemia: Cr 2.69 on admission, up from 2.10 ten days prior.In review of previous labs baseline Cr around2.3-2.6. - Cr uptrending as discussed above, likely from volume overload - strict I/Os and daily weights - monitor renal function with resumed IV diuresis - K today 4.6 - on home BID lokelma  L shoulder pain Full strength and ROM intact. Tender to palpation over biceps tendon. - Voltaren gel  DMII: Last A1c 8.5% in April this year. - CBGs improving while inpatient,  139 this AM -continuehomeVictoza - SSI  Prior to Admission Living Arrangement: SNF Anticipated Discharge Location: SNF Barriers to Discharge: pulse ox with ambulation, IV diuresis, and SNF placement back to [place]: Anticipated discharge in approximately 1-2 day(s).   Call pt's aunt, [name] [name], at 11:30AM. Updates provided. Discussed that pt is improving and would be d/c'ing back to SNF in the coming day or two. All questions and concerns addressed.  [name], MD 10/26/2019, 6:46 AM Pager: [phone]

## 2019-10-27 ENCOUNTER — Inpatient Hospital Stay (HOSPITAL_COMMUNITY): Payer: Medicare HMO

## 2019-10-27 LAB — CULTURE, BLOOD (ROUTINE X 2)
Culture: NO GROWTH
Culture: NO GROWTH
Special Requests: ADEQUATE
Special Requests: ADEQUATE

## 2019-10-27 LAB — CBC
HCT: 21.3 % — ABNORMAL LOW (ref 36.0–46.0)
HCT: 27.1 % — ABNORMAL LOW (ref 36.0–46.0)
Hemoglobin: 6.5 g/dL — CL (ref 12.0–15.0)
Hemoglobin: 8.8 g/dL — ABNORMAL LOW (ref 12.0–15.0)
MCH: 24.6 pg — ABNORMAL LOW (ref 26.0–34.0)
MCH: 25.8 pg — ABNORMAL LOW (ref 26.0–34.0)
MCHC: 30.5 g/dL (ref 30.0–36.0)
MCHC: 32.5 g/dL (ref 30.0–36.0)
MCV: 80.7 fL (ref 80.0–100.0)
MCV: 79.5 fL — ABNORMAL LOW (ref 80.0–100.0)
Platelets: 227 10*3/uL (ref 150–400)
Platelets: 250 10*3/uL (ref 150–400)
RBC: 2.64 MIL/uL — ABNORMAL LOW (ref 3.87–5.11)
RBC: 3.41 MIL/uL — ABNORMAL LOW (ref 3.87–5.11)
RDW: 15.1 % (ref 11.5–15.5)
RDW: 15.5 % (ref 11.5–15.5)
WBC: 5.5 10*3/uL (ref 4.0–10.5)
WBC: 6.6 10*3/uL (ref 4.0–10.5)
nRBC: 0 % (ref 0.0–0.2)
nRBC: 0 % (ref 0.0–0.2)

## 2019-10-27 LAB — URINALYSIS, ROUTINE W REFLEX MICROSCOPIC
Bilirubin Urine: NEGATIVE
Glucose, UA: 50 mg/dL — AB
Ketones, ur: NEGATIVE mg/dL
Leukocytes,Ua: NEGATIVE
Nitrite: NEGATIVE
Protein, ur: 100 mg/dL — AB
Specific Gravity, Urine: 1.006 (ref 1.005–1.030)
pH: 6 (ref 5.0–8.0)

## 2019-10-27 LAB — BASIC METABOLIC PANEL
Anion gap: 10 (ref 5–15)
BUN: 82 mg/dL — ABNORMAL HIGH (ref 6–20)
CO2: 22 mmol/L (ref 22–32)
Calcium: 8.4 mg/dL — ABNORMAL LOW (ref 8.9–10.3)
Chloride: 100 mmol/L (ref 98–111)
Creatinine, Ser: 2.82 mg/dL — ABNORMAL HIGH (ref 0.44–1.00)
GFR calc Af Amer: 23 mL/min — ABNORMAL LOW (ref >60)
GFR calc non Af Amer: 20 mL/min — ABNORMAL LOW (ref >60)
Glucose, Bld: 206 mg/dL — ABNORMAL HIGH (ref 70–99)
Potassium: 4.6 mmol/L (ref 3.5–5.1)
Sodium: 132 mmol/L — ABNORMAL LOW (ref 135–145)

## 2019-10-27 LAB — GLUCOSE, CAPILLARY
Glucose-Capillary: 168 mg/dL — ABNORMAL HIGH (ref 70–99)
Glucose-Capillary: 160 mg/dL — ABNORMAL HIGH (ref 70–99)
Glucose-Capillary: 176 mg/dL — ABNORMAL HIGH (ref 70–99)
Glucose-Capillary: 294 mg/dL — ABNORMAL HIGH (ref 70–99)

## 2019-10-27 LAB — PREPARE RBC (CROSSMATCH)

## 2019-10-27 LAB — CK: Total CK: 73 U/L (ref 38–234)

## 2019-10-27 MED ORDER — FUROSEMIDE 10 MG/ML IJ SOLN
40.0000 mg | Freq: Four times a day (QID) | INTRAMUSCULAR | Status: AC
  Administered 2019-10-27 (×3): 40 mg via INTRAVENOUS
  Filled 2019-10-27 (×3): qty 4

## 2019-10-27 MED ORDER — SODIUM CHLORIDE 0.9 % IV SOLN
2.0000 g | Freq: Two times a day (BID) | INTRAVENOUS | Status: AC
  Administered 2019-10-27 – 2019-10-28 (×4): 2 g via INTRAVENOUS
  Filled 2019-10-27 (×4): qty 2

## 2019-10-27 MED ORDER — SODIUM CHLORIDE 0.9% IV SOLUTION: Freq: Once | INTRAVENOUS | Status: DC

## 2019-10-27 MED ORDER — SODIUM CHLORIDE 0.9% IV SOLUTION: Freq: Once | INTRAVENOUS | Status: AC

## 2019-10-27 MED ORDER — ENOXAPARIN SODIUM 80 MG/0.8ML ~~LOC~~ SOLN: 65.0000 mg | SUBCUTANEOUS | Status: DC

## 2019-10-27 NOTE — Progress Notes (Signed)
CRITICAL VALUE ALERT  Critical Value:  Hgb 6.8   Date & Time Notied:  10/27/2019: 05:20am  Provider Notified: Daryll Drown  Orders Received/Actions taken: Pending

## 2019-10-27 NOTE — Progress Notes (Signed)
On call physician called regarding order for blood transfusion for patient stating the need for patient to have Transfusion. This RN Spoke with blood bank regarding if blood was ready for patient, Blood bank staff stated they had not received an order to prepare blood for blood transfusion. Explained to blood bank staff that we did need another unit for patient to be transfused. The blood bank staff stated they would prepare another unit for transfusion. Patients RN updated.  Will monitor. Zaniel Marineau, Bettina Gavia RN

## 2019-10-27 NOTE — Progress Notes (Signed)
     Subjective:   Left AKA by Dr. Sharol Given 10/11/2019, she has been hospitalized with concern of sepsis. History of Stage lll kidney disease, diabetes and CHF. Hospitalist called concerned about a possible hematoma in the left thigh. Patient has anemia and apparently there is concern that the thigh May relate to this.   Patient reports pain as mild.    Objective:   VITALS:  Temp:  [97.6 F (36.4 C)-98.9 F (37.2 C)] 98.1 F (36.7 C) (06/06 1551) Pulse Rate:  [79-92] 82 (06/06 1551) Resp:  [11-20] 11 (06/06 1551) BP: (126-158)/(68-84) 134/75 (06/06 1551) SpO2:  [94 %-100 %] 100 % (06/06 1551) Weight:  [138.6 kg] 138.6 kg (06/06 0615)  Neurologically intact ABD soft Incision: no drainage , no redness, no warmth.  Left thigh is larger than the right, both are large though.  Lack of warm and distension or fluctuance suggests that the  Swelling in the left leg is venous or dependent edema This may be related to renal or cardiac or poor nutrition state. No sign of significant hematoma clinically.   LABS Recent Labs    10/25/19 0401 10/25/19 0401 10/25/19 1959 10/26/19 0325 10/27/19 0344  HGB 6.0*  --  7.2* 7.2* 6.5*  WBC 6.6   < >  --  5.4 5.5  PLT 223   < >  --  223 227   < > = values in this interval not displayed.   Recent Labs    10/26/19 0325 10/27/19 0344  NA 131* 132*  K 4.6 4.6  CL 97* 100  CO2 22 22  BUN 78* 82*  CREATININE 3.15* 2.82*  GLUCOSE 147* 206*   No results for input(s): LABPT, INR in the last 72 hours.   Assessment/Plan: Left leg swelling, no warmth, incision is nearly healed. Anemia may be secondary to renal cause, low erythropoetin level due to renal insufficency.  Plan: I will ask Dr. Sharol Given to assess tomorrow on rounds, may be ready for staple removal if he agrees.       Basil Dess 10/27/2019, 4:43 PMPatient ID: [name] [name], female   DOB: 1975-10-29, 44 y.o.   MRN: [id number]

## 2019-10-27 NOTE — Progress Notes (Addendum)
Subjective:  [name] [name] is a 44 y.o. F with PMH of CKD 3-4, GERD, HTN, Hypothyroidism, HLD, OSA on CPAP, DM2, obesity and recent AKA admit for symptomatic anemia on hospital day 6  [name] [name] was examined and evaluated at bedside this am. She was observed resting comfortably in bed this am. She denies any shortness of breath, cough. She does mention some increased swelling and pain of her L aka site but she feels this is just around the suture line. She denies any productive cough, nausea, vomiting, fevers or chills.  Objective:  Vital signs in last 24 hours: Vitals:   10/26/19 1952 10/27/19 0425 10/27/19 0614 10/27/19 0615  BP: 138/72 126/68 137/78   Pulse: 92     Resp: 16 16 16    Temp: 98.1 F (36.7 C) 98.1 F (36.7 C) 98.2 F (36.8 C)   TempSrc: Oral Oral Oral   SpO2:  98% 97%   Weight:    (!) 138.6 kg  Height:       Gen: Well-developed, chronically ill-appearing, NAD HEENT: NCAT head, hearing intact, EOMI, MMM CV: RRR, S1, S2 normal, No rubs, no murmurs, no gallops Pulm: Bilateral rales, no wheezes Abd: Soft, BS+, NTND Extm: Peripheral pulses intact, 2+ pitting edema, area of ecchymosis on underside of L AKA Skin: Dry, Warm, normal turgor, surgical site with surgical staples intact without dehiscence, drainage or surrounding erythema/warmth Neuro: AAOx3  Assessment/Plan:  Principal Problem:   Acute respiratory failure with hypoxia (HCC) Active Problems:   Acute on chronic renal failure (HCC)   Controlled type 2 diabetes mellitus (HCC)   OSA (obstructive sleep apnea)   Heart failure with preserved ejection fraction (HCC)   Hyperkalemia   Symptomatic anemia   Anemia   Hx of AKA (above knee amputation), left ([name] [name])   Hypoxia  [name] [name] is a 44 yo female with a PMHx notable for CKD III-IV, GERD, HTN, hypothyroidism, HLD, OSA on CPAP, DMII, necrotizing fasciitis, obesity, and recent AKA admit for symptomatic anemia from chronic disease vs post-op, and  dyspnea 2/2 HAP.  Acute respiratory failure w/ hypoxia Multifactorial due toHAPandHFpEF: Respiratory status continues to improve with decreasing oxygen requirement. Remains afebrile. Oxygen requirement 5L->3L. Rales on exam. Afebrile. 1.7L output yesterday. Weight 138.6kg (increased from 137.8kg). Continues to appear volume overloaded - day 6/7 of antibiotic course for HAP with zyvox and cefepime - Wean to Room air - I/Os, Daily weight - C/w furosemide 40mg  IV TID, Bumex 1mg  today  Symptomatic normocytic anemia: Initially attributed to acute surgical blood loss, Fe deficiency, and chronic anemia from CKD.Hgb 5.7 on admission.S/p 2u pRBCs on 5/31 and another unit on 6/4. Received IV iron on 6/4. Am hgb 6.5. Possible oozing bleed w/ hematoma formation from surgical site vs retroperitoneal bleed. Also, urine appears pink. Denise melena, BRBPR - 1 unit pRBC ordered by night team. F/u post-transfusion cbc - UA / CT pelvis, femur - D/c lovenox - Monitor vitals  CKD stage IV: Hyperkalemia: Cr 2.69 on admission, up from 2.10 ten days prior.In review of previous labs baseline Cr around2.3-2.6. Creatinine trend 2.77->3.15->2.82. Improved from yesterday w/ more aggressive diuresis. Am K 4.6 - Daily weight, I/Os - monitor renal function with resumed IV diuresis - on home BID lokelma  L shoulder pain Full strength and ROM intact. Tender to palpation over biceps tendon. - Voltaren gel  DMII: Last A1c 8.5% in April this year. - CBGs improving while inpatient, 139 this AM -continuehomeVictoza - SSI  Prior to Admission Living Arrangement: SNF  Anticipated Discharge Location: SNF Barriers to Discharge: pulse ox with ambulation, IV diuresis, and SNF placement back to [name] [name]: Anticipated discharge in approximately 1-2 day(s).   [name] Anis, MD 10/27/2019, 6:54 AM Pager: [phone]

## 2019-10-28 DIAGNOSIS — D649 Anemia, unspecified: Secondary | ICD-10-CM

## 2019-10-28 LAB — TYPE AND SCREEN
ABO/RH(D): B POS
Antibody Screen: NEGATIVE
Unit division: 0
Unit division: 0

## 2019-10-28 LAB — BASIC METABOLIC PANEL
Anion gap: 11 (ref 5–15)
BUN: 75 mg/dL — ABNORMAL HIGH (ref 6–20)
CO2: 23 mmol/L (ref 22–32)
Calcium: 8.4 mg/dL — ABNORMAL LOW (ref 8.9–10.3)
Chloride: 101 mmol/L (ref 98–111)
Creatinine, Ser: 2.34 mg/dL — ABNORMAL HIGH (ref 0.44–1.00)
GFR calc Af Amer: 29 mL/min — ABNORMAL LOW (ref >60)
GFR calc non Af Amer: 25 mL/min — ABNORMAL LOW (ref >60)
Glucose, Bld: 214 mg/dL — ABNORMAL HIGH (ref 70–99)
Potassium: 4.5 mmol/L (ref 3.5–5.1)
Sodium: 135 mmol/L (ref 135–145)

## 2019-10-28 LAB — CBC
HCT: 23.5 % — ABNORMAL LOW (ref 36.0–46.0)
Hemoglobin: 7.4 g/dL — ABNORMAL LOW (ref 12.0–15.0)
MCH: 25.3 pg — ABNORMAL LOW (ref 26.0–34.0)
MCHC: 31.5 g/dL (ref 30.0–36.0)
MCV: 80.5 fL (ref 80.0–100.0)
Platelets: 207 10*3/uL (ref 150–400)
RBC: 2.92 MIL/uL — ABNORMAL LOW (ref 3.87–5.11)
RDW: 15.2 % (ref 11.5–15.5)
WBC: 6.6 10*3/uL (ref 4.0–10.5)
nRBC: 0 % (ref 0.0–0.2)

## 2019-10-28 LAB — GLUCOSE, CAPILLARY
Glucose-Capillary: 159 mg/dL — ABNORMAL HIGH (ref 70–99)
Glucose-Capillary: 117 mg/dL — ABNORMAL HIGH (ref 70–99)
Glucose-Capillary: 225 mg/dL — ABNORMAL HIGH (ref 70–99)
Glucose-Capillary: 183 mg/dL — ABNORMAL HIGH (ref 70–99)

## 2019-10-28 LAB — BPAM RBC
Blood Product Expiration Date: 202106062359
Blood Product Expiration Date: 202106232359
ISSUE DATE / TIME: 202106041209
ISSUE DATE / TIME: 202106061308
Unit Type and Rh: 1700
Unit Type and Rh: 7300

## 2019-10-28 MED ORDER — DARBEPOETIN ALFA 40 MCG/0.4ML IJ SOSY
40.0000 ug | PREFILLED_SYRINGE | Freq: Once | INTRAMUSCULAR | Status: AC
  Administered 2019-10-28: 40 ug via SUBCUTANEOUS
  Filled 2019-10-28: qty 0.4

## 2019-10-28 MED ORDER — FUROSEMIDE 10 MG/ML IJ SOLN
40.0000 mg | Freq: Four times a day (QID) | INTRAMUSCULAR | Status: AC
  Administered 2019-10-28 (×3): 40 mg via INTRAVENOUS
  Filled 2019-10-28 (×3): qty 4

## 2019-10-28 NOTE — Progress Notes (Signed)
Subjective:  Pt seen at the bedside this AM. Report feeling well and continuing to improve. Having some pain at the L AKA surgical site. Denies shortness of breath, dizziness/lightheadedness, or chest pain.   Sleepy during examination today. Has refused CPAP/BiPAP for last 3 nights.  Objective:  Vital signs in last 24 hours: Vitals:   10/27/19 1551 10/27/19 1948 10/28/19 0045 10/28/19 0455  BP: 134/75 (!) 158/86 (!) 149/85 (!) 120/55  Pulse: 82 100 98 87  Resp: 11  (!) 21 17  Temp: 98.1 F (36.7 C) 98.5 F (36.9 C) 98.5 F (36.9 C) 98.3 F (36.8 C)  TempSrc: Oral Oral Oral Oral  SpO2: 100% 94% 93% 100%  Weight:    (!) 138.2 kg  Height:       Physical Exam Vitals and nursing note reviewed.  Constitutional:      General: She is not in acute distress.    Appearance: She is obese. She is not ill-appearing.  Cardiovascular:     Rate and Rhythm: Normal rate and regular rhythm.     Heart sounds: Normal heart sounds.  Pulmonary:     Comments: On 3L [place] with O2 saturation at 96. Normal respiratory effort. Good air movement. Improving bibasilar crackles. No wheezing or rhonchi. Musculoskeletal:     Comments: L AKA. +1 pitting edema in RLE (improved from last week).  Skin:    General: Skin is warm and dry.     Comments: Minimal ecchymosis on posterior distal AKA site.   Neurological:     Mental Status: She is alert.    Assessment/Plan:  Principal Problem:   Acute respiratory failure with hypoxia (HCC) Active Problems:   Acute on chronic renal failure (HCC)   Controlled type 2 diabetes mellitus (HCC)   OSA (obstructive sleep apnea)   Heart failure with preserved ejection fraction (HCC)   Hyperkalemia   Symptomatic anemia   Anemia   Hx of AKA (above knee amputation), left ([place])   Hypoxia  Assessment and Plan: Ms.Devaul is a 44 yo female with a PMHx notable for CKD III-IV, GERD, HTN, hypothyroidism, HLD, OSA on CPAP, DMII, necrotizing fasciitis, obesity, and recent  AKA who presented from [facility] SNF due to anemia of <6g/dL also noting some mild dyspnea starting the day before.  Acute respiratory failure w/ hypoxia Multifactorial due toHAPandHFpEF: Respiratory status continues to improve. O2 requirement decreasing, on 2L this AM. Afebrile. - day 7 of 7 for antibiotic therapy with zyvox and cefepime - net negative 4.8L yesterday (with no PO intake recorded), wt down 0.4kg - still appears to have volume on - agressive diuresis again today with IV lasix 40mg  TID - wean O2 to room air as tolerated  Symptomatic normocytic anemia: Attributed to acute surgical blood loss, Fe deficiency, and chronic anemia from CKD.Hgb 5.7 on admission.S/p 2u pRBCs on 5/31, 1u on 6/4, and 1u on 6/6. Received IV iron on 6/4. CT imaging on 6/6 with possible hematoma inferior to bony margin on L AKA, and without RP bleed/hematoma. - AM Hgb 7.4 - ortho evaluated pt today, no clinical evidence of hematoma in L thigh - will give Aranesp 84mcg today - no evidence for active bleeding and vitals stable - AM CBC - not on pharmacologic DVT ppx  CKD stage IV: Hyperkalemia: Cr 2.69 on admission, up from 2.10 ten days prior.In review of previous labs baseline Cr around2.3-2.6. - Cr now improving with diuresis 2.34 << 2.82 << 3.15 - AM K 4.5 - strict I/Os  and daily weights - monitor renal function with IV diuresis as above - on home BID lokelma  L shoulder pain - Voltaren gel  DMII: Last A1c 8.5% in April this year. -continuehomeVictoza - SSI  Prior to Admission Living Arrangement: SNF Anticipated Discharge Location: SNF Barriers to Discharge: monitoring of anemia Dispo: Anticipated discharge in approximately 1-2 day(s).   [name], MD 10/28/2019, 6:58 AM Pager: [phone]

## 2019-10-28 NOTE — Progress Notes (Signed)
  Date: 10/28/2019  Patient name: [name] [name]  Medical record number: [id number]  Date of birth: 10-02-1975        I have seen and evaluated this patient and I have discussed the plan of care with the house staff. Please see their note for complete details. I concur with their findings with the following additions/corrections: [name] [name] was seen this morning on team rounds.  She appears overly sedated but reportedly had not gotten any sedating medications recently.  Per the team, she has not used her BiPAP for the past several nights.  [name] [name] denied feeling sleepy and is excited that she is getting close to discharge.  Her vital signs are stable although her O2 sats did decrease to the low 90s when she became sedated but quickly increased to the high 90s when she was engaged in conversation.  She remains on 2 L O2 by nasal cannula.  Her hemoglobin has trended down from 8.8-7.4 in the past 24 hours.  Her most recent ferritin in May was 68 and her percent sat has been less than 20 since 2018 and was most recently 4 in May.  She does not appear to have received epo although she did get IV iron on admission.  1.  Symptomatic normocytic anemia - likely multifactorial including iron deficiency, chronic disease.  She likely had slight blood loss with recent surgery.  LDH on admission was normal making hemolysis less likely.  She has been started on darbepoetin today and should be continued as an outpatient.  2.  Acute hypoxic respiratory failure secondary to hospital-acquired pneumonia and acute on chronic systolic heart failure - improving with antibiotics, oxygen, diuresis.  Today is the last day of antibiotics.  She continues to be diuresed although her weights and I's and O's appear to be an accurate.  Oxygen is being weaned.  DC back to SNF once euvolemic, hemoglobin stable, and oxygen has been titrated down.  Bartholomew Crews, MD 10/28/2019, 11:07 AM

## 2019-10-28 NOTE — Progress Notes (Signed)
Pt in no distress at this time requiring bipap.  Pt's VS within normal range and pt states she has not worn the machine in several days.  Bipap still in room at this time.  RT will continue to monitor.

## 2019-10-28 NOTE — Care Management Important Message (Signed)
Important Message  Patient Details  Name: TILDA SAMUDIO MRN: [id number] Date of Birth: Apr 01, 1976   Medicare Important Message Given:  Yes     Shelda Altes 10/28/2019, 3:19 PM

## 2019-10-28 NOTE — Progress Notes (Signed)
Patient ID: [name] [name], female   DOB: [birth year], 44 y.o.   MRN: [id number] Patient is seen for acute blood loss anemia with questions regarding her above-the-knee amputation on the left as being a source.  Examination of the left above-knee amputation the incision is well-healed sutures are in place there is no wound breakdown there is no drainage there is no cellulitis.  The skin is wrinkling at this time with no clinical signs of a hematoma her thigh is soft and nontender.  Clinically patient is showing excellent healing of her left above-the-knee amputation with no clinical evidence of a hematoma in her left thigh.

## 2019-10-28 NOTE — Progress Notes (Signed)
Pharmacy Antibiotic Note  [name] [name] is a 44 y.o. female admitted on 10/21/2019 with pneumonia.  Pharmacy has been consulted for Zyvox and Cefepime dosing.  ID: Abx for HCAP. Afebrile. WBC WNL. Scr 2.34   Zyvox 6/1>> (6/8) Cefepime 6/1>> (6/8)  5/31: RVP: neg 6/1: BC x 2>> neg   Plan: - Zyvox 600mg  IV q12h - Cefepime 2g IV q12h - D/c Prozac and Elavil while on Zyvox due to the elevated risk of serotonin syndrome. (F/u when Zyvox d/c'd to resume Prozac and Elavil!)  Height: 5\' 10"  (177.8 cm) Weight: (!) 138.2 kg (304 lb 10.8 oz) IBW/kg (Calculated) : 68.5  Temp (24hrs), Avg:98.2 F (36.8 C), Min:97.6 F (36.4 C), Max:98.6 F (37 C)  Recent Labs  Lab 10/24/19 0238 10/24/19 0238 10/25/19 0401 10/26/19 0325 10/27/19 0344 10/27/19 1900 10/28/19 0309  WBC 9.5   < > 6.6 5.4 5.5 6.6 6.6  CREATININE 2.60*  --  2.77* 3.15* 2.82*  --  2.34*   < > = values in this interval not displayed.    Estimated Creatinine Clearance: 47.2 mL/min (A) (by C-G formula based on SCr of 2.34 mg/dL (H)).    Allergies  Allergen Reactions   Vancomycin Other (See Comments)    Acute renal failure suspected secondary to vanco   [place] Crane, Vena [place], BCPS, [facility] Clinical Pharmacist  10/28/2019 8:42 AM   [facility] pharmacy phone numbers are listed on [email or web address]

## 2019-10-28 NOTE — Progress Notes (Signed)
Physical Therapy Treatment Patient Details Name: [name] [name] MRN: [id number] DOB: 10-29-1975 Today's Date: 10/28/2019    History of Present Illness Pt is a 44 yo female s/p recent LLE AKA on 5/26 admitted for bilateral infiltrates with hypoxia. PMHx:DM2, obesity, and bipolar disorder.    PT Comments    Pt was interested in getting OOB.  Emphasis on strengthening the L LE, transitioning to EOB, transferring via squat pivot to the [facility] and then amb/pivoting in the RW ~ 5 feet to the recliner.    Follow Up Recommendations  Supervision for mobility/OOB;SNF     Equipment Recommendations  Hospital bed    Recommendations for Other Services Rehab consult     Precautions / Restrictions Precautions Precautions: Fall Precaution Comments: LLE AKA    Mobility  Bed Mobility Overal bed mobility: Modified Independent                Transfers Overall transfer level: Needs assistance Equipment used: Rolling walker (2 wheeled);None Transfers: Sit to/from [employer] Transfers Sit to Stand: Min guard Stand pivot transfers: Min guard Squat pivot transfers: Min guard     General transfer comment: pt uses UE's well and also uses good technique and takes her time to be safe.  No assist needed.  Used safe "swing to" pattern to pivot in the RW 180* over 5 feet.  Ambulation/Gait           Gait velocity interpretation: <1.31 ft/sec, indicative of household ambulator     Marine scientist Rankin (Stroke Patients Only)       Balance Overall balance assessment: Needs assistance Sitting-balance support: Feet supported Sitting balance-Leahy Scale: Good     Standing balance support: Bilateral upper extremity supported;During functional activity Standing balance-Leahy Scale: Poor Standing balance comment: bil UE support with RW                            Cognition Arousal/Alertness:  Awake/alert Behavior During Therapy: WFL for tasks assessed/performed Overall Cognitive Status: Within Functional Limits for tasks assessed                                        Exercises Amputee Exercises Hip Extension: AROM;10 reps;Seated;Other (comment)(resisted ) Hip ABduction/ADduction: AAROM;15 reps;Supine Straight Leg Raises: AROM;Left;10 reps;Supine    General Comments        Pertinent Vitals/Pain Pain Assessment: Faces Faces Pain Scale: Hurts a little bit Pain Location: LLE residual limb with movement Pain Descriptors / Indicators: Discomfort Pain Intervention(s): Monitored during session    Home Living                      Prior Function            PT Goals (current goals can now be found in the care plan section) Acute Rehab PT Goals PT Goal Formulation: With patient Time For Goal Achievement: 11/07/19 Potential to Achieve Goals: Good Progress towards PT goals: Progressing toward goals    Frequency    Min 3X/week      PT Plan Current plan remains appropriate    Co-evaluation              AM-PAC PT "6 Clicks" Mobility   Outcome Measure  Help needed  turning from your back to your side while in a flat bed without using bedrails?: None Help needed moving from lying on your back to sitting on the side of a flat bed without using bedrails?: None Help needed moving to and from a bed to a chair (including a wheelchair)?: A Little   Help needed to walk in hospital room?: A Little Help needed climbing 3-5 steps with a railing? : Total 6 Click Score: 15    End of Session   Activity Tolerance: Patient tolerated treatment well Patient left: with call bell/phone within reach;in chair Nurse Communication: Mobility status PT Visit Diagnosis: Other abnormalities of gait and mobility (R26.89);Difficulty in walking, not elsewhere classified (R26.2);Pain;Muscle weakness (generalized) (M62.81) Pain - Right/Left: Left Pain - part  of body: Leg     Time: 1456-1520 PT Time Calculation (min) (ACUTE ONLY): 24 min  Charges:  $Therapeutic Exercise: 8-22 mins $Therapeutic Activity: 8-22 mins                     10/28/2019  [name] [name]., PT Acute Rehabilitation Services [phone]  (pager) [phone]  (office)   [name] [name] 10/28/2019, 5:59 PM

## 2019-10-29 LAB — BASIC METABOLIC PANEL
Anion gap: 8 (ref 5–15)
BUN: 71 mg/dL — ABNORMAL HIGH (ref 6–20)
CO2: 24 mmol/L (ref 22–32)
Calcium: 8.7 mg/dL — ABNORMAL LOW (ref 8.9–10.3)
Chloride: 103 mmol/L (ref 98–111)
Creatinine, Ser: 2.37 mg/dL — ABNORMAL HIGH (ref 0.44–1.00)
GFR calc Af Amer: 28 mL/min — ABNORMAL LOW (ref >60)
GFR calc non Af Amer: 24 mL/min — ABNORMAL LOW (ref >60)
Glucose, Bld: 153 mg/dL — ABNORMAL HIGH (ref 70–99)
Potassium: 4.7 mmol/L (ref 3.5–5.1)
Sodium: 135 mmol/L (ref 135–145)

## 2019-10-29 LAB — CBC
HCT: 23.6 % — ABNORMAL LOW (ref 36.0–46.0)
Hemoglobin: 7.3 g/dL — ABNORMAL LOW (ref 12.0–15.0)
MCH: 25.1 pg — ABNORMAL LOW (ref 26.0–34.0)
MCHC: 30.9 g/dL (ref 30.0–36.0)
MCV: 81.1 fL (ref 80.0–100.0)
Platelets: 204 10*3/uL (ref 150–400)
RBC: 2.91 MIL/uL — ABNORMAL LOW (ref 3.87–5.11)
RDW: 15.4 % (ref 11.5–15.5)
WBC: 6.4 10*3/uL (ref 4.0–10.5)
nRBC: 0 % (ref 0.0–0.2)

## 2019-10-29 LAB — GLUCOSE, CAPILLARY
Glucose-Capillary: 145 mg/dL — ABNORMAL HIGH (ref 70–99)
Glucose-Capillary: 162 mg/dL — ABNORMAL HIGH (ref 70–99)
Glucose-Capillary: 162 mg/dL — ABNORMAL HIGH (ref 70–99)
Glucose-Capillary: 260 mg/dL — ABNORMAL HIGH (ref 70–99)

## 2019-10-29 LAB — SARS CORONAVIRUS 2 (TAT 6-24 HRS): SARS Coronavirus 2: NEGATIVE

## 2019-10-29 MED ORDER — BUMETANIDE 1 MG PO TABS
1.0000 mg | ORAL_TABLET | Freq: Two times a day (BID) | ORAL | Status: DC
  Administered 2019-10-29 – 2019-10-30 (×3): 1 mg via ORAL
  Filled 2019-10-29 (×3): qty 1

## 2019-10-29 MED ORDER — ENOXAPARIN SODIUM 40 MG/0.4ML ~~LOC~~ SOLN
40.0000 mg | SUBCUTANEOUS | Status: DC
  Administered 2019-10-30 – 2019-10-31 (×2): 40 mg via SUBCUTANEOUS
  Filled 2019-10-29 (×2): qty 0.4

## 2019-10-29 NOTE — Progress Notes (Signed)
Bipap is PRN order, no distress noted at this time.  Will continue to monitor.

## 2019-10-29 NOTE — Progress Notes (Signed)
Occupational Therapy Treatment Patient Details Name: [name] [name] MRN: [id number] DOB: 1976-02-16 Today's Date: 10/29/2019    History of present illness Pt is a 44 yo female s/p recent LLE AKA on 5/26 admitted for bilateral infiltrates with hypoxia. PMHx:DM2, obesity, and bipolar disorder.   OT comments  Patient continues to make steady progress towards goals in skilled OT session. Patient's session encompassed minor cognitive assessment (pt ordering her lunch) due to increased lethargy at beginning of session, as pt was noted on multiple occasions to nod off mid sentence. Pt able to complete task independently with no errors. Pt then able to complete mobility to progress to static standing with RW, (pt had already completed basic ADLs that morning) to promote increased activity tolerance. Pt able to static stand for 4 minutes on 2L O2 with appropriate O2 sats prior to fatigue. Therapist now recommending SNF due to CIR denial; will continue to follow acutely.     Follow Up Recommendations  SNF    Equipment Recommendations  (defer to next venue)    Recommendations for Other Services      Precautions / Restrictions Precautions Precautions: Fall Precaution Comments: LLE AKA Restrictions Weight Bearing Restrictions: Yes LLE Weight Bearing: (AKA) Other Position/Activity Restrictions: BKA revision to AKA       Mobility Bed Mobility               General bed mobility comments: pt in recliner upon arrival  Transfers Overall transfer level: Needs assistance Equipment used: Rolling walker (2 wheeled) Transfers: Sit to/from Stand Sit to Stand: Min guard         General transfer comment: Pt able to complete sit<>stand in order to promote standing for ADLs at sink, pt able to statically stand for 4 minutes with education provided on extending LUE back to prevent increased tightening at hip flexor    Balance Overall balance assessment: Needs assistance Sitting-balance  support: Feet supported Sitting balance-Leahy Scale: Good     Standing balance support: Bilateral upper extremity supported;During functional activity Standing balance-Leahy Scale: Poor Standing balance comment: bil UE support with RW                           ADL either performed or assessed with clinical judgement   ADL Overall ADL's : Needs assistance/impaired                                       General ADL Comments: Session focus on prolonged standing with RW to promote increased activity tolerance     Vision       Perception     Praxis      Cognition Arousal/Alertness: Awake/alert Behavior During Therapy: WFL for tasks assessed/performed Overall Cognitive Status: Within Functional Limits for tasks assessed                                 General Comments: Pt remains motivated, significant improvement with O2 and increased endurance in session        Exercises     Shoulder Instructions       General Comments      Pertinent Vitals/ Pain       Pain Assessment: No/denies pain  Home Living  Prior Functioning/Environment              Frequency  Min 2X/week        Progress Toward Goals  OT Goals(current goals can now be found in the care plan section)  Progress towards OT goals: Progressing toward goals  Acute Rehab OT Goals Patient Stated Goal: to walk again OT Goal Formulation: With patient Time For Goal Achievement: 11/06/19 Potential to Achieve Goals: Good  Plan Discharge plan remains appropriate    Co-evaluation                 AM-PAC OT "6 Clicks" Daily Activity     Outcome Measure   Help from another person eating meals?: None Help from another person taking care of personal grooming?: A Little Help from another person toileting, which includes using toliet, bedpan, or urinal?: A Lot Help from another person bathing  (including washing, rinsing, drying)?: A Lot Help from another person to put on and taking off regular upper body clothing?: A Little Help from another person to put on and taking off regular lower body clothing?: A Lot 6 Click Score: 16    End of Session Equipment Utilized During Treatment: Oxygen;Rolling walker  OT Visit Diagnosis: Unsteadiness on feet (R26.81);Pain   Activity Tolerance Patient tolerated treatment well   Patient Left with call bell/phone within reach;in chair   Nurse Communication Mobility status        Time: 9735-3299 OT Time Calculation (min): 28 min  Charges: OT General Charges $OT Visit: 1 Visit OT Treatments $Self Care/Home Management : 23-37 mins  Trezevant. Temelec, Van Acute Rehabilitation Services [place] 10/29/2019, 11:58 AM

## 2019-10-29 NOTE — TOC Progression Note (Signed)
Transition of Care [facility]) - Progression Note    Patient Details  Name: [name] [name] MRN: [id number] Date of Birth: 1975-08-23  Transition of Care [facility]) CM/SW [place], [place] Phone Number: 10/29/2019, 10:55 AM  Clinical Narrative:     [place]/SNF confirmed bed offer and started insurance authorization.  Thurmond Butts, MSW, [place] Clinical Social Worker   Expected Discharge Plan: Skilled Nursing Facility Barriers to Discharge: Continued Medical Work up  Expected Discharge Plan and Services Expected Discharge Plan: [place] In-house Referral: Clinical Social Work     Living arrangements for the past 2 months: [place]                                       Social Determinants of Health (SDOH) Interventions    Readmission Risk Interventions Readmission Risk Prevention Plan 10/14/2019  Transportation Screening Complete  PCP or Specialist Appt within 3-5 Days Not Complete  Not Complete comments plan for rehab  [place] or [place] Complete  Social Work Consult for [place] Planning/Counseling Complete  Palliative Care Screening Not Applicable  Medication Review (RN Care Manager) Referral to Pharmacy  Some recent data might be hidden

## 2019-10-29 NOTE — Progress Notes (Signed)
Subjective:   Pt seen at the bedside this AM. Sitting up in bedside chair eating breakfast. She notes feeling back to baseline and feels ready for discharge. Discussed potential need for continuing supplemental oxygen at discharge to SNF and need for outpatient PCP follow-up and iron infusion. All questions and concerns addressed.   Objective:  Vital signs in last 24 hours: Vitals:   10/29/19 0056 10/29/19 0437 10/29/19 0700 10/29/19 1024  BP: 135/77 125/66 137/71   Pulse: 82 81 80 79  Resp: 16 16 (!) 21   Temp: 98.2 F (36.8 C) 97.7 F (36.5 C) (!) 97.5 F (36.4 C)   TempSrc: Oral Oral Oral   SpO2: 92% (!) 89% 94%   Weight:      Height:       Physical Exam Vitals and nursing note reviewed.  Constitutional:      General: She is not in acute distress.    Appearance: She is obese.     Comments: Up in chair finishing breakfast.  Cardiovascular:     Rate and Rhythm: Normal rate and regular rhythm.     Heart sounds: Normal heart sounds.  Pulmonary:     Effort: Pulmonary effort is normal. No respiratory distress.     Breath sounds: Normal breath sounds.     Comments: Clear to ascultation bilaterally.  Musculoskeletal:     Comments: L AKA. Well healing surgical site with stables in place over incision.  RLE with improved pitting edema, now only +1 around ankles.  Skin:    General: Skin is warm and dry.  Neurological:     Mental Status: She is alert.    Assessment/Plan:  Principal Problem:   Acute respiratory failure with hypoxia (HCC) Active Problems:   Acute on chronic renal failure (HCC)   Controlled type 2 diabetes mellitus (HCC)   OSA (obstructive sleep apnea)   Heart failure with preserved ejection fraction (HCC)   Hyperkalemia   Symptomatic anemia   Anemia   Hx of AKA (above knee amputation), left ([place])   Hypoxia  Assessment and Plan: Ms.Klunk is a 44 yo female with a PMHx notable for CKD III-IV, GERD, HTN, hypothyroidism, HLD, OSA on CPAP, DMII,  necrotizing fasciitis, obesity, and recent AKA who presented from [facility] SN[facility] due to anemia of <6g/dL also noting some mild dyspnea starting the day before.  Acute respiratory failure w/ hypoxia: resolved Multifactorial due toHAPandHFpEF: Respiratory statuscontinues to improve. On 2L [place] this AM. - completed 7 day course of antibiotic therapy with zyvox and cefepime - net negative 4.4L yesterday, no weight recorded - net negative 8.7L this admission - volume status much improved on exam - resume home 1mg  Bumex BID - check O2 with ambulation/transfer to determine potential O2 requirement at discharge - wean O2 to room air as tolerated - strict I/Os and daily weights  Symptomatic normocytic anemia: stable Attributed to acute surgical blood loss, Fe deficiency, and chronic anemia from CKD. Orthopedics evaluated on 6/7 and saw no clinical evidence of hematoma at the L AKA surgical site.Hgb 5.7 on admission.S/p 2u pRBCs on 5/31, 1u on 6/4, and 1u on 6/6. Received IV iron on 6/4. - Hgb holding at 7.3 - pt received Aranesp on 6/7 - per chart review, established with [facility] in Dec 2020 and now will need f/u for outpatient Epo - monitor CBC while awaiting transfer back to SNF - can received 2nd IV iron transfusion on 6/11 if still inpatient - AM CBC - not on  pharmacologic DVT ppx  CKD stage IV: Hyperkalemia: Cr 2.69 on admission, up from 2.10 ten days prior.In review of previous labs baseline Cr around2.3-2.6. - Cr stable after IV diuresis yesterday, 3.15 > 2.2 > 2.34 > 2.37 - AM K 4.7 on home BID lokelma - strict I/Os and daily weights - f/u AM BMP  L shoulder pain - Voltaren gel  DMII: Last A1c 8.5% in April this year. -continuehomeVictoza - SSI  Prior to Admission Living Arrangement: SNF Anticipated Discharge Location: SNF Barriers to Discharge: insurance authorization, COVID pending Dispo: Medically stable for discharge. Anticipated discharge in  approximately 1-2 day(s).   [name], MD 10/29/2019, 10:52 AM Pager: [phone] After 5pm on weekdays and 1pm on weekends: On Call pager [phone]

## 2019-10-30 LAB — CBC
HCT: 25.4 % — ABNORMAL LOW (ref 36.0–46.0)
Hemoglobin: 7.7 g/dL — ABNORMAL LOW (ref 12.0–15.0)
MCH: 25.2 pg — ABNORMAL LOW (ref 26.0–34.0)
MCHC: 30.3 g/dL (ref 30.0–36.0)
MCV: 83.3 fL (ref 80.0–100.0)
Platelets: 190 10*3/uL (ref 150–400)
RBC: 3.05 MIL/uL — ABNORMAL LOW (ref 3.87–5.11)
RDW: 15.6 % — ABNORMAL HIGH (ref 11.5–15.5)
WBC: 6.3 10*3/uL (ref 4.0–10.5)
nRBC: 0 % (ref 0.0–0.2)

## 2019-10-30 LAB — GLUCOSE, CAPILLARY
Glucose-Capillary: 150 mg/dL — ABNORMAL HIGH (ref 70–99)
Glucose-Capillary: 119 mg/dL — ABNORMAL HIGH (ref 70–99)
Glucose-Capillary: 230 mg/dL — ABNORMAL HIGH (ref 70–99)
Glucose-Capillary: 196 mg/dL — ABNORMAL HIGH (ref 70–99)

## 2019-10-30 LAB — BASIC METABOLIC PANEL
Anion gap: 11 (ref 5–15)
BUN: 68 mg/dL — ABNORMAL HIGH (ref 6–20)
CO2: 22 mmol/L (ref 22–32)
Calcium: 8.6 mg/dL — ABNORMAL LOW (ref 8.9–10.3)
Chloride: 101 mmol/L (ref 98–111)
Creatinine, Ser: 2.28 mg/dL — ABNORMAL HIGH (ref 0.44–1.00)
GFR calc Af Amer: 30 mL/min — ABNORMAL LOW (ref >60)
GFR calc non Af Amer: 25 mL/min — ABNORMAL LOW (ref >60)
Glucose, Bld: 162 mg/dL — ABNORMAL HIGH (ref 70–99)
Potassium: 4.7 mmol/L (ref 3.5–5.1)
Sodium: 134 mmol/L — ABNORMAL LOW (ref 135–145)

## 2019-10-30 MED ORDER — BUMETANIDE 2 MG PO TABS
2.0000 mg | ORAL_TABLET | Freq: Two times a day (BID) | ORAL | Status: DC
  Administered 2019-10-30 – 2019-10-31 (×3): 2 mg via ORAL
  Filled 2019-10-30 (×4): qty 1

## 2019-10-30 NOTE — Progress Notes (Signed)
Physical Therapy Treatment Patient Details Name: [name] [name] MRN: [id number] DOB: 1975-06-04 Today's Date: 10/30/2019    History of Present Illness Pt is a 44 yo female s/p recent LLE AKA on 5/26 admitted for bilateral infiltrates with hypoxia. PMHx:DM2, obesity, and bipolar disorder.    PT Comments    Pt showed through completing several simulated  activities and describing methods she used to negotiate her grand mother's home that she is likely to be safe going home to her grandmother's instead of SNF rehab.  Follow Up Recommendations  Home health PT;Supervision - Intermittent     Equipment Recommendations  None recommended by PT    Recommendations for Other Services       Precautions / Restrictions Precautions Precautions: Fall(minimal risk for dx) Precaution Comments: LLE AKA Restrictions Other Position/Activity Restrictions: BKA revision to AKA    Mobility  Bed Mobility Overal bed mobility: Modified Independent                Transfers Overall transfer level: Modified independent Equipment used: Rolling walker (2 wheeled);None Transfers: Sit to/from [employer] Transfers Sit to Stand: Modified independent (Device/Increase time) Stand pivot transfers: Modified independent (Device/Increase time) Squat pivot transfers: Modified independent (Device/Increase time)     General transfer comment: pt showed control and safety with transfers using RW or no AD, even standing and moving the seated surface around.  Ambulation/Gait Ambulation/Gait assistance: Supervision Gait Distance (Feet): 8 Feet(5 feet to show ability to safely move in confined space) Assistive device: None;Rolling walker (2 wheeled) Gait Pattern/deviations: Step-to pattern Gait velocity: decreased Gait velocity interpretation: <1.31 ft/sec, indicative of household ambulator General Gait Details: smooth step to pattern with safe use of the RW   Stairs              Wheelchair Mobility    Modified Rankin (Stroke Patients Only)       Balance   Sitting-balance support: Feet supported Sitting balance-Leahy Scale: Normal     Standing balance support: No upper extremity supported;Bilateral upper extremity supported;Single extremity supported Standing balance-Leahy Scale: Fair Standing balance comment: UE assist preferable, but pt is able to stand with little to no support in a given situation                            Cognition Arousal/Alertness: Awake/alert Behavior During Therapy: WFL for tasks assessed/performed Overall Cognitive Status: Within Functional Limits for tasks assessed                                        Exercises      General Comments        Pertinent Vitals/Pain Pain Assessment: Faces Faces Pain Scale: Hurts a little bit Pain Location: general L LE Pain Descriptors / Indicators: Discomfort Pain Intervention(s): Monitored during session    Home Living                      Prior Function            PT Goals (current goals can now be found in the care plan section) Acute Rehab PT Goals Patient Stated Goal: to walk again PT Goal Formulation: With patient Time For Goal Achievement: 11/07/19 Potential to Achieve Goals: Good Progress towards PT goals: Progressing toward goals    Frequency    Min 3X/week  PT Plan Discharge plan needs to be updated    Co-evaluation              AM-PAC PT "6 Clicks" Mobility   Outcome Measure  Help needed turning from your back to your side while in a flat bed without using bedrails?: None Help needed moving from lying on your back to sitting on the side of a flat bed without using bedrails?: None Help needed moving to and from a bed to a chair (including a wheelchair)?: None Help needed standing up from a chair using your arms (e.g., wheelchair or bedside chair)?: None Help needed to walk in hospital room?:  None Help needed climbing 3-5 steps with a railing? : A Lot 6 Click Score: 22    End of Session   Activity Tolerance: Patient tolerated treatment well Patient left: with call bell/phone within reach;in chair Nurse Communication: Mobility status PT Visit Diagnosis: Other abnormalities of gait and mobility (R26.89);Difficulty in walking, not elsewhere classified (R26.2);Pain;Muscle weakness (generalized) (M62.81)     Time: 4707-6151 PT Time Calculation (min) (ACUTE ONLY): 36 min  Charges:  $Gait Training: 8-22 mins $Therapeutic Activity: 8-22 mins                     10/30/2019  Ginger Carne., PT Acute Rehabilitation Services [phone]  (pager) [phone]  (office)   Tessie Fass Emmalou Hunger 10/30/2019, 6:50 PM

## 2019-10-30 NOTE — Progress Notes (Signed)
   Subjective:  [name] [name] is a 44 y.o. F with PMH of CKD 3-4, GERD, HTN, Hypothyroidism, HLD, OSA on CPAP, DM2, obesity and recent AKA admit for symptomatic anemia on hospital day 9  [name] [name] was examined and evaluated at bedside this am. She was observed to be somnolent and she had not been on CPAP overnight. Upon awakening, she mentions that she feels well and denies any cough, dyspnea. Mentions no significant complaints. She promptly fell back to sleep.  Objective:  Vital signs in last 24 hours: Vitals:   10/30/19 0400 10/30/19 0500 10/30/19 0511 10/30/19 0821  BP:  (!) 149/83  (!) 152/81  Pulse:  84  77  Resp: 20 18  16   Temp:  98.2 F (36.8 C)  98 F (36.7 C)  TempSrc:  Oral  Oral  SpO2:  96%  91%  Weight:   (!) 136.4 kg   Height:       Gen: Well-developed, well nourished, Somnolent HEENT: NCAT head, hearing intact, EOMI CV: RRR, S1, S2 normal, No rubs, no murmurs, no gallops Pulm: CTAB, No rales, no wheezes Abd: Soft, BS+, NTND Extm: Stable L AKA, 1+ pitting edema up to mid shin Skin: Dry, Warm, normal turgor, surgical site w/ staples intact Neuro: Somnolent  Assessment/Plan:  Principal Problem:   Acute respiratory failure with hypoxia (HCC) Active Problems:   Acute on chronic renal failure (HCC)   Controlled type 2 diabetes mellitus (HCC)   OSA (obstructive sleep apnea)   Heart failure with preserved ejection fraction (HCC)   Hyperkalemia   Symptomatic anemia   Anemia   Hx of AKA (above knee amputation), left ([place])   Hypoxia  [name] [name] is a 44 y.o. F with PMH of CKD 3-4, GERD, HTN, Hypothyroidism, HLD, OSA on CPAP, DM2, obesity and recent AKA admit for symptomatic anemia for pneumonia and anemia  Acute on chronic diastolic heart failure Switched to home oral Bumex from IV lasix yesterday. -1.7L urine output. Appear volume overloaded on exam. - Increase bumetanide to 2mg  Bid - I/Os, Daily weight - Trend and replete K as needed -  Discharge to SNF once insurance authorization and bed placement complete  Acute respiratory failure w/ hypoxia: resolved HAP Satting 96 on Room Air. S/p 7 days of linezolid, cefepime - Monitor  OSA Appear somnolent this am. Likely due to sleep apnea from not using CPAP - CPAP qhs  Symptomatic blood loss / iron deficiency anemia Hgb stable after 4 units since admission. Also received Iron and Aranesp. Complicated by menstrual cycle during admission. Due for repeat iron infusion on 11/01/19. Hgb trend 7.4->7.3->7.7 - Trend cbc   CKD4 Stable at baseline (creatinine 2.34->2.37->2.28) - Trend renal fx - Avoid nephrotoxic meds when able  DVT prophx: SCDs Diet: HH/CM Code: Full  Prior to Admission Living Arrangement: SNF Anticipated Discharge Location: SNF Barriers to Discharge: Insurance authorization Dispo: Anticipated discharge in approximately 1-2 day(s).   [name] Anis, MD 10/30/2019, 10:48 AM Pager: [phone]

## 2019-10-30 NOTE — TOC Progression Note (Signed)
Transition of Care [facility]) - Progression Note    Patient Details  Name: [name] [name] MRN: [id number] Date of Birth: 04/08/1976  Transition of Care [facility]) CM/SW [place], [place] Phone Number: 10/30/2019, 11:47 AM  Clinical Narrative:     Per SNF- patient's insurance requested updated PT note- CSW updated PT/Kenneth of insurance request.   Thurmond Butts, MSW, [place] Clinical Social Worker   Expected Discharge Plan: [place] Barriers to Discharge: Continued Medical Work up  Expected Discharge Plan and Services Expected Discharge Plan: [place] In-house Referral: Clinical Social Work     Living arrangements for the past 2 months: [place]                                       Social Determinants of Health (SDOH) Interventions    Readmission Risk Interventions Readmission Risk Prevention Plan 10/14/2019  Transportation Screening Complete  PCP or Specialist Appt within 3-5 Days Not Complete  Not Complete comments plan for rehab  [place] or [place] Complete  Social Work Consult for [place] Planning/Counseling Complete  Palliative Care Screening Not Applicable  Medication Review Press photographer) Referral to Pharmacy  Some recent data might be hidden

## 2019-10-30 NOTE — Discharge Summary (Signed)
Name: [name] [name] MRN: [id number] DOB: 1976-02-08 44 y.o. PCP: Ina Homes, MD  Date of Admission: 10/21/2019  4:05 PM Date of Discharge: 10/31/19 Attending Physician: Bartholomew Crews, MD  Discharge Diagnosis: 1. Iron deficiency anemia due to chronic blood loss 2. Healthcare-associated pneumonia 3. Acute on chronic diastolic heart failure 4. Acute on chronic kidney disease stage 3b  Discharge Medications: Allergies as of 10/31/2019      Reactions   Vancomycin Other (See Comments)   Acute renal failure suspected secondary to vanco      Medication List    STOP taking these medications   amitriptyline 50 MG tablet Commonly known as: ELAVIL   Colchicine 0.6 MG Caps   FLUoxetine 20 MG/5ML solution Commonly known as: PROZAC   nystatin powder Commonly known as: MYCOSTATIN/NYSTOP     TAKE these medications   allopurinol 100 MG tablet Commonly known as: Zyloprim Take 1 tablet (100 mg total) by mouth 2 (two) times daily.   amLODipine 10 MG tablet Commonly known as: NORVASC Take 1 tablet (10 mg total) by mouth daily.   atorvastatin 20 MG tablet Commonly known as: LIPITOR Take 1 tablet (20 mg total) by mouth daily.   blood glucose meter kit and supplies Kit ICD 10- E11.69. Based on patient and insurance preference. Use up to four times daily as directed.   bumetanide 2 MG tablet Commonly known as: BUMEX Take 1 tablet (2 mg total) by mouth 2 (two) times daily. What changed:   medication strength  how much to take  when to take this   cyclobenzaprine 5 MG tablet Commonly known as: FLEXERIL take 1 tablet by mouth daily as needed for muscle spasms What changed: See the new instructions.   diclofenac Sodium 1 % Gel Commonly known as: VOLTAREN Apply 1 application topically 4 (four) times daily as needed (pain).   hydrOXYzine 25 MG tablet Commonly known as: ATARAX/VISTARIL TAKE 1 TABLET BY MOUTH EVERY SIX HOURS AS NEEDED FOR ANXIETY What  changed: See the new instructions.   insulin lispro 100 UNIT/ML injection Commonly known as: HUMALOG Inject 2-10 Units into the skin 3 (three) times daily before meals. Sliding Scale: If BG is 200-250=2 units, 251-300=4 units, 301-350=6 units, 351-400=8 units, >400=10 units. If still >400 after 1 hour call MD   lisinopril 10 MG tablet Commonly known as: ZESTRIL Take 1 tablet (10 mg total) by mouth daily.   metoprolol succinate 25 MG 24 hr tablet Commonly known as: TOPROL-XL Take 0.5 tablets (12.5 mg total) by mouth daily.   oxyCODONE 5 MG immediate release tablet Commonly known as: Oxy IR/ROXICODONE Take 1-2 tablets (5-10 mg total) by mouth every 4 (four) hours as needed for moderate pain (pain score 4-6). What changed:   how much to take  reasons to take this   pregabalin 50 MG capsule Commonly known as: LYRICA Take 1 capsule (50 mg total) by mouth 2 (two) times daily.   sodium bicarbonate 650 MG tablet Take 1 tablet (650 mg total) by mouth 2 (two) times daily.   sodium zirconium cyclosilicate 10 g Pack packet Commonly known as: LOKELMA Take 10 g by mouth 2 (two) times daily.   tetrahydrozoline 0.05 % ophthalmic solution Place 2 drops into both eyes 2 (two) times daily as needed (red/ dry eyes). Alcon   TRUEplus Pen Needles 31G X 5 MM Misc Generic drug: Insulin Pen Needle Use pen needle with insulin 3 times daily What changed: See the new instructions.   Victoza 18  MG/3ML Sopn Generic drug: liraglutide Inject 0.3 mLs (1.8 mg total) below the skin daily. What changed: See the new instructions.       Disposition and follow-up:   [name] was discharged from [facility] in [place] condition.  At the hospital follow up visit please address:  1. Iron deficiency anemia due to chronic blood loss - Received iron, multiple blood products and EPO during hospitalization - Check cbc  2. Healthcare-associated pneumonia - Assess for  resolution  3. Acute on chronic diastolic heart failure - Bumetanide increased to 78m BID - Check volume status and adjust diuretic regimen  4. Acute on chronic kidney disease stage 3b - Check electrolytes and renal fx  2.  Labs / imaging needed at time of follow-up: cbc, bmp  3.  Pending labs/ test needing follow-up: N/A  Follow-up Appointments:  Follow-up Information    HIna Homes MD. Go on 11/04/2019.   Specialty: Internal Medicine Why: 8:45 AM Contact information: [id number]:  1. Iron deficiency anemia due to chronic blood loss: [name] is a 44yo F w/ PMH of CKD3b, GERD, HTN, Hypothyroidism, OSA, DM2, and recent AKA presenting to [facility] w/ hemoglobin of 5.7. Initially thoughtto be post-operative anemia, she was also found to have iron sat of 4% w/ ferritin of 64 and increased reticulocyte count. She was given 2 units of blood and 2x weekly iron infusion. Over the course of hospitalization, she was noted to have recurrent drop in hemoglobin. CT pelvis and thigh was performed which ruled out any retroperitoneal bleed or expanding hematoma from surgical site. She was noted to be on menstrual cycle and was given 2 additional units of blood as well as erythropoietin during her hospitalization. She was discharged once her hemoglobin stabilized around 8 with recommendation to f/u with PCP  2. Healthcare-associated pneumonia: On hospital day 1, she was found to be febrile with worsening oxygen requirement requiring BiPAP. Chest X-ray was performed showing evidence of pneumonia. She was treated with linezolid and cefepime for 7 day course. Her oxygenation improved and her fevers resolved.  3. Acute on chronic diastolic heart failure: On admission she was noted to have significant edema with elevated BNP. Echocardiogram was performed showing intact systolic function with EF of 60-65%. She  was treated with IV furosemide intermittently to titrate for euvolemia in setting of repeat blood administrations. Discharged on bumetanide with recommendation to follow up with PCP.  4. Acute on chronic kidney disease stage 3b: On admission she was noted to have worsening renal function (baseline creatinine 2.02. Peak 3.15)  after receiving multiple blood products. Treated with IV diuretics with return to baseline. Discharged w/ recommendation to f/u with PCP on bumetanide.  Discharge Vitals:   BP (!) 148/85 (BP Location: Left Arm)   Pulse 60   Temp 97.6 F (36.4 C) (Oral)   Resp 17   Ht _0  (1.778 m)   Wt 133.5 kg   SpO2 94%   BMI 42.23 kg/m   Pertinent Labs, Studies, and Procedures:  CBC Latest Ref Rng & Units 10/31/2019 10/30/2019 10/29/2019  WBC 4.0 - 10.5 K/uL 5.6 6.3 6.4  Hemoglobin 12.0 - 15.0 g/dL 8.4(L) 7.7(L) 7.3(L)  Hematocrit 36 - 46 % 27.5(L) 25.4(L) 23.6(L)  Platelets 150 - 400 K/uL 173 190 204   BMP Latest Ref Rng & Units 10/31/2019  10/30/2019 10/29/2019  Glucose 70 - 99 mg/dL 202(H) 162(H) 153(H)  BUN 6 - 20 mg/dL 67(H) 68(H) 71(H)  Creatinine 0.44 - 1.00 mg/dL 2.22(H) 2.28(H) 2.37(H)  BUN/Creat Ratio 9 - 23 - - -  Sodium 135 - 145 mmol/L 134(L) 134(L) 135  Potassium 3.5 - 5.1 mmol/L 4.8 4.7 4.7  Chloride 98 - 111 mmol/L 101 101 103  CO2 22 - 32 mmol/L _0 Calcium 8.9 - 10.3 mg/dL 8.9 8.6(L) 8.7(L)   PORTABLE CHEST 1 VIEW FINDINGS: The patient has extensive infiltrates throughout the left lung as well as at the right lung base laterally. Heart is within normal limits considering the AP portable technique. Pulmonary vascularity is normal. No effusions. No acute bone abnormality.  IMPRESSION: Extensive bilateral pulmonary infiltrates. This could represent pneumonia or pulmonary edema. However, there is no pulmonary vascular congestion.  ECHOCARDIOGRAM REPORT   IMPRESSIONS  1. Left ventricular ejection fraction, by estimation, is 60 to 65%. The  left  ventricle has normal function. The left ventricle has no regional  wall motion abnormalities. Left ventricular diastolic parameters are  indeterminate.  2. Right ventricular systolic function is normal. The right ventricular  size is not well visualized.  3. Left atrial size was mildly dilated.  4. Right atrial size was mildly dilated.  5. The mitral valve is normal in structure. Trivial mitral valve  regurgitation. No evidence of mitral stenosis.  6. The aortic valve has an indeterminant number of cusps. Aortic valve  regurgitation is not visualized. No aortic stenosis is present.  7. The inferior vena cava is dilated in size with <50% respiratory  variability, suggesting right atrial pressure of 15 mmHg.   Conclusion(s)/Recommendation(s): Normal biventricular function without  evidence of hemodynamically significant valvular heart disease.   CT OF THE LOWER LEFT EXTREMITY WITHOUT CONTRAST FINDINGS: Bones/Joint/Cartilage  Expected appearance from left above the knee amputation at the level of the distal diaphysis about 37.2 cm below the hip joint line. There is abrupt cut off of the bony margin compatible with recent amputation. No bony destructive findings. No appreciable fracture.  Ligaments  Suboptimally assessed by CT.  Muscles and Tendons  Amorphous intermediate density along the wrap of muscular tissues below the inferior bony margin, nonspecific but tending to favor hematoma over infectious process given lack gas and given the lack of significant adjacent bony reaction.  Soft tissues  Subcutaneous edema in the thigh, especially distally, and overlying the stump. Skin staples along the anterior stomach. No drainable subcutaneous abscess is identified.  Enlarged left inguinal lymph node 2.1 cm in short axis on image 104/4. This is most likely reactive.  IMPRESSION: 1. Expected appearance from left above the knee amputation at the level of the  distal diaphysis. There is abrupt cut off of the bony margin compatible with recent amputation. 2. Amorphous intermediate density along the wrap of muscular tissues below the inferior bony margin, nonspecific but tending to favor hematoma over infectious process given lack of gas and given the lack of significant adjacent bony reaction. 3. Subcutaneous edema in the thigh, especially distally, and overlying the stump. Cellulitis is not excluded. No drainable abscess or gas tracking in the soft tissues appear 4. Enlarged left inguinal lymph node, most likely reactive.  Discharge Instructions: Discharge Instructions    Call MD for:  difficulty breathing, headache or visual disturbances   Complete by: As directed    Call MD for:  extreme fatigue   Complete by: As directed    Call  MD for:  persistant dizziness or light-headedness   Complete by: As directed    Call MD for:  persistant nausea and vomiting   Complete by: As directed    Call MD for:  redness, tenderness, or signs of infection (pain, swelling, redness, odor or green/yellow discharge around incision site)   Complete by: As directed    Call MD for:  temperature >100.4   Complete by: As directed    Diet - low sodium heart healthy   Complete by: As directed    Discharge instructions   Complete by: As directed    Dear Hilton Sinclair Mckinney  You came to [place] with anemia. We have determined this was caused by iron deficiency, your kidney disease, your period and in part due to your kidney disease. Here are our recommendations for you at discharge:  Please follow up with the internal medicine clinic on 11/04/19 Please make sure to follow up with your orthopedic doctor Increase your bumetanide to 84m twice daily  Thank you for choosing [facility]   Increase activity slowly   Complete by: As directed    No wound care   Complete by: As directed      Signed: LMosetta Anis MD 10/31/2019, 11:29 AM   LMosetta Anis MD 10/31/2019,  11:31 AM  Pager: [phone] After 5pm on weekdays and 1pm on weekends: On Call Pager: [phone]

## 2019-10-30 NOTE — Procedures (Signed)
Patient states that she does not wear CPAP at home, declined CPAP for tonight.

## 2019-10-31 ENCOUNTER — Telehealth: Payer: Self-pay

## 2019-10-31 LAB — BASIC METABOLIC PANEL
Anion gap: 9 (ref 5–15)
BUN: 67 mg/dL — ABNORMAL HIGH (ref 6–20)
CO2: 24 mmol/L (ref 22–32)
Calcium: 8.9 mg/dL (ref 8.9–10.3)
Chloride: 101 mmol/L (ref 98–111)
Creatinine, Ser: 2.22 mg/dL — ABNORMAL HIGH (ref 0.44–1.00)
GFR calc Af Amer: 30 mL/min — ABNORMAL LOW (ref >60)
GFR calc non Af Amer: 26 mL/min — ABNORMAL LOW (ref >60)
Glucose, Bld: 202 mg/dL — ABNORMAL HIGH (ref 70–99)
Potassium: 4.8 mmol/L (ref 3.5–5.1)
Sodium: 134 mmol/L — ABNORMAL LOW (ref 135–145)

## 2019-10-31 LAB — CBC
HCT: 27.5 % — ABNORMAL LOW (ref 36.0–46.0)
Hemoglobin: 8.4 g/dL — ABNORMAL LOW (ref 12.0–15.0)
MCH: 24.9 pg — ABNORMAL LOW (ref 26.0–34.0)
MCHC: 30.5 g/dL (ref 30.0–36.0)
MCV: 81.4 fL (ref 80.0–100.0)
Platelets: 173 10*3/uL (ref 150–400)
RBC: 3.38 MIL/uL — ABNORMAL LOW (ref 3.87–5.11)
RDW: 15.9 % — ABNORMAL HIGH (ref 11.5–15.5)
WBC: 5.6 10*3/uL (ref 4.0–10.5)
nRBC: 0 % (ref 0.0–0.2)

## 2019-10-31 LAB — GLUCOSE, CAPILLARY
Glucose-Capillary: 142 mg/dL — ABNORMAL HIGH (ref 70–99)
Glucose-Capillary: 186 mg/dL — ABNORMAL HIGH (ref 70–99)
Glucose-Capillary: 177 mg/dL — ABNORMAL HIGH (ref 70–99)

## 2019-10-31 MED ORDER — BUMETANIDE 2 MG PO TABS: 2.0000 mg | ORAL_TABLET | Freq: Two times a day (BID) | ORAL | Status: DC

## 2019-10-31 MED ORDER — SODIUM CHLORIDE 0.9 % IV SOLN
510.0000 mg | Freq: Once | INTRAVENOUS | Status: AC
  Administered 2019-10-31: 510 mg via INTRAVENOUS
  Filled 2019-10-31: qty 17

## 2019-10-31 NOTE — Telephone Encounter (Signed)
Hospital TOC per Dr. Ronnald Ramp, discharge 10/31/2019, appt 11/04/2019.

## 2019-10-31 NOTE — TOC Progression Note (Signed)
Transition of Care [facility]) - Progression Note    Patient Details  Name: [name] [name] MRN: [id number] Date of Birth: [birth year]  Transition of Care [facility]) CM/SW [place], [place] Phone Number: 10/31/2019, 12:19 PM  Clinical Narrative:     CSW visit with patient. Patient confirmed she no longer wants to go back to SNF- she wants to discharge home. Patient states she does not live alone and believes she will be "better off" at home than SNF.   CSW contacted SNF/[facility], advised patient will discharge home- family will come to pick up her things. CSW informed SNF patient states her blue dress was in their cleaners and she wants that returned but the rest of her belongings was in her room. Patient states no other concerns at this time.   Updated RNCM and RN.  Thurmond Butts, MSW, [place] Clinical Social Worker   Expected Discharge Plan: Skilled Nursing Facility Barriers to Discharge: Continued Medical Work up  Expected Discharge Plan and Services Expected Discharge Plan: [place] In-house Referral: Clinical Social Work     Living arrangements for the past 2 months: [place] Expected Discharge Date: 10/31/19                                     Social Determinants of Health (SDOH) Interventions    Readmission Risk Interventions Readmission Risk Prevention Plan 10/14/2019  Transportation Screening Complete  PCP or Specialist Appt within 3-5 Days Not Complete  Not Complete comments plan for rehab  [place] or [place] Complete  Social Work Consult for [place] Planning/Counseling Complete  Palliative Care Screening Not Applicable  Medication Review Press photographer) Referral to Pharmacy  Some recent data might be hidden

## 2019-10-31 NOTE — Discharge Instructions (Signed)
Anemia  Anemia is a condition in which you do not have enough red blood cells or hemoglobin. Hemoglobin is a substance in red blood cells that carries oxygen. When you do not have enough red blood cells or hemoglobin (are anemic), your body cannot get enough oxygen and your organs may not work properly. As a result, you may feel very tired or have other problems. What are the causes? Common causes of anemia include:  Excessive bleeding. Anemia can be caused by excessive bleeding inside or outside the body, including bleeding from the intestine or from periods in women.  Poor nutrition.  Long-lasting (chronic) kidney, thyroid, and liver disease.  Bone marrow disorders.  Cancer and treatments for cancer.  HIV (human immunodeficiency virus) and AIDS (acquired immunodeficiency syndrome).  Treatments for HIV and AIDS.  Spleen problems.  Blood disorders.  Infections, medicines, and autoimmune disorders that destroy red blood cells. What are the signs or symptoms? Symptoms of this condition include:  Minor weakness.  Dizziness.  Headache.  Feeling heartbeats that are irregular or faster than normal (palpitations).  Shortness of breath, especially with exercise.  Paleness.  Cold sensitivity.  Indigestion.  Nausea.  Difficulty sleeping.  Difficulty concentrating. Symptoms may occur suddenly or develop slowly. If your anemia is mild, you may not have symptoms. How is this diagnosed? This condition is diagnosed based on:  Blood tests.  Your medical history.  A physical exam.  Bone marrow biopsy. Your health care provider may also check your stool (feces) for blood and may do additional testing to look for the cause of your bleeding. You may also have other tests, including:  Imaging tests, such as a CT scan or MRI.  Endoscopy.  Colonoscopy. How is this treated? Treatment for this condition depends on the cause. If you continue to lose a lot of blood, you may  need to be treated at a hospital. Treatment may include:  Taking supplements of iron, vitamin S31, or folic acid.  Taking a hormone medicine (erythropoietin) that can help to stimulate red blood cell growth.  Having a blood transfusion. This may be needed if you lose a lot of blood.  Making changes to your diet.  Having surgery to remove your spleen. Follow these instructions at home:  Take over-the-counter and prescription medicines only as told by your health care provider.  Take supplements only as told by your health care provider.  Follow any diet instructions that you were given.  Keep all follow-up visits as told by your health care provider. This is important. Contact a health care provider if:  You develop new bleeding anywhere in the body. Get help right away if:  You are very weak.  You are short of breath.  You have pain in your abdomen or chest.  You are dizzy or feel faint.  You have trouble concentrating.  You have bloody or black, tarry stools.  You vomit repeatedly or you vomit up blood. Summary  Anemia is a condition in which you do not have enough red blood cells or enough of a substance in your red blood cells that carries oxygen (hemoglobin).  Symptoms may occur suddenly or develop slowly.  If your anemia is mild, you may not have symptoms.  This condition is diagnosed with blood tests as well as a medical history and physical exam. Other tests may be needed.  Treatment for this condition depends on the cause of the anemia. This information is not intended to replace advice given to you by  your health care provider. Make sure you discuss any questions you have with your health care provider. Document Revised: 04/21/2017 Document Reviewed: 06/10/2016 Elsevier Patient Education  [place].

## 2019-10-31 NOTE — Progress Notes (Signed)
Discharge instructions given to patient. IV removed, clean and intact. Medications reviewed. All questions answered. Pt escorted home with grandfather.  Arletta Bale, RN

## 2019-10-31 NOTE — Progress Notes (Signed)
Subjective:   Ms.Head was examined and evaluated at bedside this am. She was observed somnolent and repeatedly falling asleep during evaluation. She mentions that is she were to be discharged home, she would need a way to get her belongings from the SNF which was admitted from. She mentions concern regarding care of her surgical site and not compromising her stitches. However, on clarification, she states that she would like to discharged home. Discussed recommendation for close follow-up in [facility] clinic on Monday if pt is to go home. Pt agreeable and understanding.  Objective:  Vital signs in last 24 hours: Vitals:   10/30/19 1543 10/30/19 2005 10/30/19 2335 10/31/19 0439  BP: 136/68 (!) 155/78 (!) 148/85 (!) 149/80  Pulse: 89 83 78 78  Resp: 15 18 15 18   Temp: 98.2 F (36.8 C) 98.2 F (36.8 C) 97.6 F (36.4 C) (!) 97 F (36.1 C)  TempSrc: Oral Oral Axillary Axillary  SpO2: 94% 96% 98% 100%  Weight:    133.5 kg  Height:       Physical Exam Constitutional:      General: She is sleeping.     Appearance: She is obese. She is ill-appearing (chronically).     Comments: Pt sitting up at the edge of bed. In no acute distress. Falling asleep during examination.  Pulmonary:     Effort: Pulmonary effort is normal. No respiratory distress.     Breath sounds: Normal breath sounds. No wheezing, rhonchi or rales.     Comments: 98-100% on room air. Musculoskeletal:        General: Deformity (L AKA) present.     Comments: L AKA with surgical site clean, dry, and intact.  RLE with trace pitting edema to mid-shin.  Skin:    General: Skin is warm and dry.  Neurological:     Mental Status: She is lethargic.    Assessment/Plan:  Principal Problem:   Acute respiratory failure with hypoxia (HCC) Active Problems:   Acute on chronic renal failure (HCC)   Controlled type 2 diabetes mellitus (HCC)   OSA (obstructive sleep apnea)   Heart failure with preserved ejection fraction (HCC)    Hyperkalemia   Symptomatic anemia   Anemia   Hx of AKA (above knee amputation), left ([place])   Hypoxia  Assessment and Plan: [name] [name] is a 44 yo female with a PMHx notable for CKD III-IV, GERD, HTN, hypothyroidism, HLD, OSA on CPAP, DMII, necrotizing fasciitis, obesity, and recent AKA who presented from [facility] SNF due to anemia of <6g/dL also noting some mild dyspnea starting the day before.  Acute respiratory failure w/ hypoxia: resolved Multifactorial due toHAPandHFpEF: Pt on room air this AM with O2 saturation 98-100%. Completed 7 day course of antibiotic therapy with zyvox and cefepime on 6/7. - net +250cc yesterday with no recorded output, wt down 2.9kg - total neg negative 11L this admission - on Bumex 2mg  BID  - slightly higher dose of outpatient diuretic than on before admission - will continue at d/c and f/u volume status at clinic appointment next week  Symptomatic normocytic anemia: improving Attributed to acute surgical blood loss, Fe deficiency, and chronic anemia from CKD. Orthopedics evaluated on 6/7 and saw no clinical evidence of hematoma at the L AKA surgical site.Hgb 5.7 on admission.S/p 2u pRBCs on 5/31, 1uon 6/4, and 1u on 6/6. Received IV iron on 6/4 and Aranesp on 6/7. - Hgb 8.4 this AM - will give IV iron before d/c - CBC check in [facility]  on Monday - pt to f/u with [facility] for continuation of outpatient Aranesp  CKD stage IV: Hyperkalemia: Cr 2.69 on admission, up from 2.10 ten days prior.In review of previous labs baseline Cr around2.3-2.6. - Cr at baseline, 3.15 > 2.2 > 2.34 > 2.37 > 2.28 > 2.22 - AM K 4.8 on home lokelma - strict I/Os and daily weights - discussed recommendation for pt to follow-up with [facility] next week for continued monitoring of her CKD and outpatient Epo for her anemia, pt understanding and will call to schedule appointment  L shoulder pain - Voltaren gel  DMII: Last A1c 8.5% in April this  year. -continuehomeVictoza - SSI  Prior to Admission Living Arrangement: SNF Anticipated Discharge Location: home with [facility] Barriers to Discharge: none Dispo: Anticipated discharge in approximately 0 day(s).   [name], MD 10/31/2019, 6:19 AM Pager: [phone] After 5pm on weekdays and 1pm on weekends: On Call pager [phone]

## 2019-10-31 NOTE — TOC Transition Note (Signed)
Transition of Care [facility]) - CM/SW Discharge Note   Patient Details  Name: [name] [name] MRN: [id number] Date of Birth: [birth year]  [facility]) CM/SW Contact:  Verdell Carmine, RN Phone Number: 10/31/2019, 12:47 PM   Clinical Narrative:    Patient decided to go home to grandmothers house. Will have home health PT. Ordered 3:1 to use at bedside as well as for shower chair. Already has wheelchair. Patient trying to get her own place. Has already turned in all paperwork. , Just awaiting call back. Alvis Lemmings was used prior and will use again for PT. 3:1 ordered. Ready for discharge.    Final next level of care: [place] Barriers to Discharge: No Barriers Identified   Patient Goals and CMS Choice  given to patient       Discharge Placement  Home with home health                     Discharge Plan and Services In-house Referral: Clinical Social Work                DME Agency: [employer] Date DME Agency Contacted: 10/31/19 Time DME Agency Contacted: 24 Representative spoke with at DME Agency: [place]: PT [place]: [place] Date [place]: 10/31/19 Time [place]: 26 Representative spoke with at [place]: [place] (SDOH) Interventions     Readmission Risk Interventions Readmission Risk Prevention Plan 10/14/2019  Transportation Screening Complete  PCP or Specialist Appt within 3-5 Days Not Complete  Not Complete comments plan for rehab  [place] or [place] Complete  Social Work Consult for [place] Planning/Counseling Complete  Palliative Care Screening Not Applicable  Medication Review (RN Care Manager) Referral to Pharmacy  Some recent data might be hidden

## 2019-11-01 NOTE — Telephone Encounter (Signed)
TC to patient for Transitions Of Care Telephone Call.  VM obtained and hippa compliant message left. SChaplin, RN,BSN

## 2019-11-04 ENCOUNTER — Ambulatory Visit: Payer: Medicare HMO

## 2019-11-06 ENCOUNTER — Ambulatory Visit (INDEPENDENT_AMBULATORY_CARE_PROVIDER_SITE_OTHER): Payer: Medicare HMO | Admitting: Family

## 2019-11-06 ENCOUNTER — Other Ambulatory Visit: Payer: Self-pay

## 2019-11-06 ENCOUNTER — Encounter: Payer: Self-pay | Admitting: Family

## 2019-11-06 VITALS — Ht 70.0 in | Wt 294.0 lb

## 2019-11-06 DIAGNOSIS — Z89612 Acquired absence of left leg above knee: Secondary | ICD-10-CM

## 2019-11-06 NOTE — Progress Notes (Signed)
Post-Op Visit Note   Patient: [name] [name]           Date of Birth: 1975-09-01           MRN: [id number] Visit Date: 11/06/2019 PCP: Ina Homes, MD  Chief Complaint:  Chief Complaint  Patient presents with  . Left Leg - Routine Post Op    10/11/19 left AKA    HPI:  HPI  Patient is a 44 year old woman seen today for week status post left above-knee amputation  Ortho Exam Incision well-healed sutures and staples are in place there is no erythema no drainage no sign of infection  Visit Diagnoses:  1. Left above-knee amputee ([place])     Plan: Sutures and staples harvested without incident.  Given an order to [facility] for her prosthesis set up.  She will follow-up in the office in 4 weeks.  Follow-Up Instructions: Return in about 4 weeks (around 12/04/2019), or if symptoms worsen or fail to improve.   Imaging: No results found.  Orders:  No orders of the defined types were placed in this encounter.  No orders of the defined types were placed in this encounter.    PMFS History: Patient Active Problem List   Diagnosis Date Noted  . Hx of AKA (above knee amputation), left ([place])   . Hypoxia   . Symptomatic anemia 10/21/2019  . Anemia 10/21/2019  . Acute respiratory failure with hypoxia ([place]) 10/21/2019  . Blurry vision, right eye 10/08/2019  . Wound dehiscence 09/04/2019  . Right carpal tunnel syndrome 06/14/2019  . Left carpal tunnel syndrome 06/14/2019  . Tobacco use 03/01/2019  . Phantom limb pain ([place]) 11/08/2018  . Current moderate episode of major depressive disorder ([place])   . Hyperkalemia   . Anemia of chronic disease   . Severe protein-calorie malnutrition ([place])   . Dehiscence of amputation stump ([place])   . Fall   . Status post below knee amputation, left ([place]) 04/11/2017  . Bipolar affective disorder ([place])   . Adjustment disorder with depressed mood 03/26/2017  . Heart failure with preserved ejection fraction ([place]) 02/09/2017  . Routine  adult health maintenance 12/08/2016  . Chronic pain of right knee 09/08/2016  . Chronic kidney disease (CKD), stage III (moderate) 07/15/2016  . OSA (obstructive sleep apnea) 05/13/2016  . Morbid obesity due to excess calories ([place]) 11/27/2015  . Hypothyroidism 04/25/2013  . Acute on chronic renal failure ([place]) 07/12/2012  . Controlled type 2 diabetes mellitus ([place]) 07/12/2012  . Carpal tunnel syndrome, bilateral 01/25/2012  . Diabetic polyneuropathy associated with type 2 diabetes mellitus ([place]) 07/04/2011  . Anxiety and depression 06/02/2011  . GERD 12/06/2007  . Hyperlipidemia 08/29/2007  . Hypertension associated with diabetes ([place]) 08/29/2007   Past Medical History:  Diagnosis Date  . Abdominal muscle pain 09/08/2016  . Abnormal Pap smear of cervix 2009  . Abscess    history of multiple abscesses  . Acute bilateral low back pain 02/13/2017  . Acute blood loss anemia   . Acute renal failure ([place]) 07/12/2012  . AKI (acute kidney injury) ([place])   . Anemia of chronic disease 2002  . Anxiety    Panic attacks  . Bilateral lower extremity edema 05/13/2016  . Bipolar disorder ([place])   . Cellulitis 05/21/2014   right eye  . Chronic bronchitis ([place])    "get it q yr" (05/13/2013)  . Chronic pain   . Depression   . Edema of lower extremity   . Endocarditis 2002  subacute bacterial endocarditis.   . Family history of anesthesia complication    "my mom has a hard time coming out from under"  . Fibromyalgia   . GERD (gastroesophageal reflux disease)    occ  . Heart murmur   . Herpes simplex type 1 infection 01/16/2018  . History of blood transfusion    "just low blood count" (05/13/2013)  . Hyperlipidemia   . Hypertension   . Hypothyroidism   . Hypothyroidism, adult 03/21/2014  . Leukocytosis   . Necrosis ([place])    and ulceration  . Necrotizing fasciitis s/p OR debridements 07/06/2012  . Obesity   . OSA on CPAP    does not wear all the time  . Peripheral neuropathy   . Type  II diabetes mellitus (HCC)    Type  II    Family History  Problem Relation Age of Onset  . Heart failure Mother   . Diabetes Mother   . Kidney disease Mother   . Kidney disease Father   . Diabetes Father   . Diabetes Paternal Grandmother   . Heart failure Paternal Grandmother   . Other Other     Past Surgical History:  Procedure Laterality Date  . ABDOMINAL AORTOGRAM W/LOWER EXTREMITY Left 03/19/2019   Procedure: ABDOMINAL AORTOGRAM W/LOWER EXTREMITY;  Surgeon: Serafina Mitchell, MD;  Location: [facility];  Service: Cardiovascular;  Laterality: Left;  . ABOVE KNEE LEG AMPUTATION Left 10/11/2019   wound dehisence   . AMPUTATION Left 04/07/2017   Procedure: LEFT BELOW KNEE AMPUTATION;  Surgeon: Newt Minion, MD;  Location: [facility];  Service: Orthopedics;  Laterality: Left;  . AMPUTATION Left 10/11/2019   Procedure: LEFT ABOVE KNEE AMPUTATION;  Surgeon: Newt Minion, MD;  Location: [facility];  Service: Orthopedics;  Laterality: Left;  . EYE SURGERY     lazer  . INCISION AND DRAINAGE ABSCESS     multiple I&Ds  . INCISION AND DRAINAGE ABSCESS Left 07/09/2012   Procedure: DRESSING CHANGE, THIGH WOUND;  Surgeon: Harl Bowie, MD;  Location: [facility];  Service: General;  Laterality: Left;  . INCISION AND DRAINAGE OF WOUND Left 07/07/2012   Procedure: IRRIGATION AND DEBRIDEMENT WOUND;  Surgeon: Harl Bowie, MD;  Location: [facility];  Service: General;  Laterality: Left;  . INCISION AND DRAINAGE PERIRECTAL ABSCESS Left 07/14/2012   Procedure: DEBRIDEMENT OF SKIN & SOFT TISSUE; DRESSING CHANGE UNDER ANESTHESIA;  Surgeon: Gayland Curry, MD,FACS;  Location: [facility];  Service: General;  Laterality: Left;  . INCISION AND DRAINAGE PERIRECTAL ABSCESS Left 07/16/2012   Procedure: I&D Left Thigh;  Surgeon: Gwenyth Ober, MD;  Location: [facility];  Service: General;  Laterality: Left;  . INCISION AND DRAINAGE PERIRECTAL ABSCESS N/A 01/05/2015   Procedure: IRRIGATION AND DEBRIDEMENT PERIRECTAL  ABSCESS;  Surgeon: Donnie Mesa, MD;  Location: [facility];  Service: General;  Laterality: N/A;  . IRRIGATION AND DEBRIDEMENT ABSCESS Left 07/06/2012   Procedure: IRRIGATION AND DEBRIDEMENT ABSCESS BUTTOCKS AND THIGH;  Surgeon: Shann Medal, MD;  Location: [facility];  Service: General;  Laterality: Left;  . IRRIGATION AND DEBRIDEMENT ABSCESS Left 08/10/2012   Procedure: IRRIGATION AND DEBRIDEMENT ABSCESS;  Surgeon: Madilyn Hook, DO;  Location: [facility];  Service: General;  Laterality: Left;  . STUMP REVISION Left 06/09/2017   Procedure: REVISION LEFT BELOW KNEE AMPUTATION;  Surgeon: Newt Minion, MD;  Location: [facility];  Service: Orthopedics;  Laterality: Left;  . STUMP REVISION Left 09/04/2019   Procedure: LEFT BELOW  KNEE AMPUTATION REVISION;  Surgeon: Newt Minion, MD;  Location: [facility];  Service: Orthopedics;  Laterality: Left;  . STUMP REVISION Left 09/20/2019   Procedure: LEFT BELOW KNEE AMPUTATION REVISION;  Surgeon: Newt Minion, MD;  Location: [facility];  Service: Orthopedics;  Laterality: Left;  . TONSILLECTOMY  1994   Social History   Occupational History  . Occupation: disability  Tobacco Use  . Smoking status: Former Smoker    Packs/day: 0.50    Years: 16.00    Pack years: 8.00    Types: Cigarettes  . Smokeless tobacco: Former Systems developer    Quit date: 09/16/2019  . Tobacco comment: quit smoking 3wks ago  Vaping Use  . Vaping Use: Former  . Devices: CBD oil  Substance and Sexual Activity  . Alcohol use: Yes    Alcohol/week: 0.0 standard drinks    Comment: Socially-" monthly  maybe"  . Drug use: No  . Sexual activity: Yes

## 2019-11-07 ENCOUNTER — Ambulatory Visit (INDEPENDENT_AMBULATORY_CARE_PROVIDER_SITE_OTHER): Payer: Medicare HMO | Admitting: Internal Medicine

## 2019-11-07 ENCOUNTER — Other Ambulatory Visit: Payer: Self-pay | Admitting: Internal Medicine

## 2019-11-07 ENCOUNTER — Telehealth: Payer: Self-pay

## 2019-11-07 VITALS — BP 178/93 | HR 84 | Temp 97.9°F | Wt 266.9 lb

## 2019-11-07 DIAGNOSIS — I5032 Chronic diastolic (congestive) heart failure: Secondary | ICD-10-CM

## 2019-11-07 DIAGNOSIS — Z89612 Acquired absence of left leg above knee: Secondary | ICD-10-CM | POA: Diagnosis not present

## 2019-11-07 DIAGNOSIS — N1832 Chronic kidney disease, stage 3b: Secondary | ICD-10-CM | POA: Diagnosis not present

## 2019-11-07 DIAGNOSIS — E1159 Type 2 diabetes mellitus with other circulatory complications: Secondary | ICD-10-CM

## 2019-11-07 DIAGNOSIS — D500 Iron deficiency anemia secondary to blood loss (chronic): Secondary | ICD-10-CM

## 2019-11-07 DIAGNOSIS — I10 Essential (primary) hypertension: Secondary | ICD-10-CM

## 2019-11-07 DIAGNOSIS — J9601 Acute respiratory failure with hypoxia: Secondary | ICD-10-CM | POA: Diagnosis not present

## 2019-11-07 MED ORDER — METOPROLOL SUCCINATE ER 25 MG PO TB24: 25.0000 mg | ORAL_TABLET | Freq: Every day | ORAL | 2 refills | Status: DC

## 2019-11-07 MED ORDER — HYDRALAZINE HCL 25 MG PO TABS
25.0000 mg | ORAL_TABLET | Freq: Three times a day (TID) | ORAL | 2 refills | Status: DC
Start: 2019-11-07 — End: 2020-01-08

## 2019-11-07 NOTE — Assessment & Plan Note (Signed)
On examination, there was noted to be 1 staple left in incision after all other staples were removed yesterday by orthopedics. Staple was able to be removed without any complications, including bleeding.

## 2019-11-07 NOTE — Progress Notes (Signed)
CC: hospital follow up; anemia follow up  HPI:  [name] [name] is a 44 y.o. with a PMHx of HTN, DM2, HFpEF, CKD3b, left AKA who presents to the clinic for hospital follow up; anemia follow up.   Please see the Encounters tab for problem-based Assessment & Plan regarding status of patient's chronic conditions.  Past Medical History:  Diagnosis Date  . Abdominal muscle pain 09/08/2016  . Abnormal Pap smear of cervix 2009  . Abscess    history of multiple abscesses  . Acute bilateral low back pain 02/13/2017  . Acute blood loss anemia   . Acute renal failure ([place]) 07/12/2012  . AKI (acute kidney injury) ([place])   . Anemia of chronic disease 2002  . Anxiety    Panic attacks  . Bilateral lower extremity edema 05/13/2016  . Bipolar disorder ([place])   . Cellulitis 05/21/2014   right eye  . Chronic bronchitis ([place])    "get it q yr" (05/13/2013)  . Chronic pain   . Depression   . Edema of lower extremity   . Endocarditis 2002   subacute bacterial endocarditis.   . Family history of anesthesia complication    "my mom has a hard time coming out from under"  . Fibromyalgia   . GERD (gastroesophageal reflux disease)    occ  . Heart murmur   . Herpes simplex type 1 infection 01/16/2018  . History of blood transfusion    "just low blood count" (05/13/2013)  . Hyperlipidemia   . Hypertension   . Hypothyroidism   . Hypothyroidism, adult 03/21/2014  . Leukocytosis   . Necrosis ([place])    and ulceration  . Necrotizing fasciitis s/p OR debridements 07/06/2012  . Obesity   . OSA on CPAP    does not wear all the time  . Peripheral neuropathy   . Type II diabetes mellitus (HCC)    Type  II   Review of Systems: Review of Systems  Constitutional: Negative for chills and fever.  Respiratory: Negative for cough, shortness of breath and wheezing.   Cardiovascular: Positive for leg swelling. Negative for chest pain and orthopnea.  Gastrointestinal: Negative for abdominal pain,  diarrhea, nausea and vomiting.  Neurological: Negative for focal weakness.  Psychiatric/Behavioral: Negative for depression.   Physical Exam:  Vitals:   11/07/19 1027 11/07/19 1124  BP: (!) 186/96 (!) 178/93  Pulse: 84   Temp: 97.9 F (36.6 C)   TempSrc: Oral   SpO2: 100%   Weight: 266 lb 14.4 oz (121.1 kg)    Physical Exam Vitals and nursing note reviewed.  Constitutional:      General: She is not in acute distress.    Appearance: She is obese.  Cardiovascular:     Rate and Rhythm: Normal rate and regular rhythm.     Heart sounds: Murmur (holosystolic murmur heard best at the right sternal border) heard.  No gallop.   Pulmonary:     Effort: Pulmonary effort is normal. No respiratory distress.     Breath sounds: Normal breath sounds. No wheezing, rhonchi or rales.  Abdominal:     General: There is no distension.     Palpations: Abdomen is soft.     Tenderness: There is no abdominal tenderness. There is no guarding.  Musculoskeletal:     Right lower leg: Edema (1+ pitting edema up to the knee) present.  Skin:    General: Skin is warm and dry.     Comments: Left AKA stump with incision  healing well. No erythema or purulent drainage. 1 staple remaining on right end of incision that was able to be removed without any complications.   Neurological:     General: No focal deficit present.     Mental Status: She is alert and oriented to person, place, and time. Mental status is at baseline.  Psychiatric:        Mood and Affect: Mood normal.        Behavior: Behavior normal.    Assessment & Plan:   See Encounters Tab for problem based charting.  Patient discussed with Dr. Philipp Ovens

## 2019-11-07 NOTE — Patient Instructions (Addendum)
It was nice seeing you today! Thank you for choosing [facility] for your Primary Care.    Today we talked about:   1. Low blood counts: We rechecked your blood counts today to make sure they are moving in the right direction. I can call you with the results.   2. Blood Pressure: Your medications were adjusted today to help bring your blood pressure down. Hydralazine was added.This medication needs to be taken 3 times per day.  3. Heart Failure: Continue taking Bumetanide 2 mg twice daily.   4. We will hold off on restarting the Lokelma at this time. I will call you if that should change.    Please call your kidney doctor to schedule a follow up visit.

## 2019-11-07 NOTE — Telephone Encounter (Signed)
Zigmund Daniel with [facility] called wanting to know if it was okay to remove a staple from patient's stomp, that was overlooked at her visit on Wednesday, 11/06/2019. Talked with Autumn F., Dr. Jess Barters assistant and was advised that staple could be removed.

## 2019-11-07 NOTE — Assessment & Plan Note (Signed)
[name] [name] was recently admitted to the hospital. On admission she was noted to have significant edema with elevated BNP. Echocardiogram was performed showing intact systolic function with EF of 60-65%. She was treated with IV furosemide intermittently to titrate for euvolemia. Discharged on bumetanide with recommendation to follow up with PCP.  Today, she notes minimal leg swelling and no SOB. She is tolerating Bumex well.   Assessment/Plan:  Mild lower extremity pitting edema on examination today but no crackles on lung exam. No changes to medications planned today.   - Bumex 2 mg BID - Metoprolol-XL 24 mg

## 2019-11-07 NOTE — Assessment & Plan Note (Signed)
BP: (!) 178/93  [name] [name] states she has been taking her medications daily. Denies any chest pain, headache, palpitations, dizziness, vision changes.   Assessment/Plan:  Uncontrolled. Current regimen includes Amlodipine 10 mg, Toprol 25 mg, Lisinopril 10 mg. Toprol was recently increased to that dose after hospitalization. Due to CKD, unable to increase Lisinopril. Will add on Hydralazine.   - Continue Amlodipine 10 mg, Toprol 25 mg, Lisinopril 10 mg - Start Hydralazine 25 mg TID - F/U in 1 month

## 2019-11-07 NOTE — Assessment & Plan Note (Signed)
During hospitalization, [name] [name] had acute exacerbation of CKD which resolved with diuretics. Today, she states she continues to take sodium bicarb daily but is no longer taking Lokelma. She denies any confusion, fatigue, malaise, difficulty urinating.   Assessment/Plan:  - Encouraged follow up with [facility] obtained - Continue sodium bicarb - Hold Lokelma till BMP results return. If potassium is WNL at that time, continue holding.

## 2019-11-07 NOTE — Assessment & Plan Note (Signed)
[name] [name] denies any SOB of today. She states she only had it when her pneumonia was present.   Assessment/Plan:  Resolved. No additional interventions indicated.

## 2019-11-07 NOTE — Assessment & Plan Note (Signed)
Patient was recently admitted for symptomatic anemia with a hemoglobin of 5.7. Initially thought to be post-operative anemia, however she was also found to have iron sat of 4% w/ ferritin of 64 and increased reticulocyte count. She was given 2 units of blood and 2x weekly iron infusion. Over the course of hospitalization, she was noted to have recurrent drop in hemoglobin. CT pelvis and thigh was performed which ruled out any retroperitoneal bleed or expanding hematoma from surgical site. She was noted to be on menstrual cycle and was given 2 additional units of blood as well as erythropoietin during her hospitalization. She was discharged once her hemoglobin stabilized around 8.   Today, she denies any additional bleeding from her wound. She denies any dizziness, SOB, chest pain.   Assessment/Plan:  Will obtain CBC to ensure continued improvement in hemoglobin.   - CBC pending

## 2019-11-08 LAB — CBC
Hematocrit: 32 % — ABNORMAL LOW (ref 34.0–46.6)
Hemoglobin: 10.1 g/dL — ABNORMAL LOW (ref 11.1–15.9)
MCH: 25.4 pg — ABNORMAL LOW (ref 26.6–33.0)
MCHC: 31.6 g/dL (ref 31.5–35.7)
MCV: 80 fL (ref 79–97)
Platelets: 291 10*3/uL (ref 150–450)
RBC: 3.98 x10E6/uL (ref 3.77–5.28)
RDW: 17 % — ABNORMAL HIGH (ref 11.7–15.4)
WBC: 5.4 10*3/uL (ref 3.4–10.8)

## 2019-11-08 LAB — BMP8+ANION GAP
Anion Gap: 14 mmol/L (ref 10.0–18.0)
BUN/Creatinine Ratio: 21 (ref 9–23)
BUN: 44 mg/dL — ABNORMAL HIGH (ref 6–24)
CO2: 21 mmol/L (ref 20–29)
Calcium: 8.9 mg/dL (ref 8.7–10.2)
Chloride: 107 mmol/L — ABNORMAL HIGH (ref 96–106)
Creatinine, Ser: 2.06 mg/dL — ABNORMAL HIGH (ref 0.57–1.00)
GFR calc Af Amer: 33 mL/min/{1.73_m2} — ABNORMAL LOW (ref >59)
GFR calc non Af Amer: 29 mL/min/{1.73_m2} — ABNORMAL LOW (ref >59)
Glucose: 165 mg/dL — ABNORMAL HIGH (ref 65–99)
Potassium: 5.4 mmol/L — ABNORMAL HIGH (ref 3.5–5.2)
Sodium: 142 mmol/L (ref 134–144)

## 2019-11-08 NOTE — Progress Notes (Signed)
Internal Medicine Clinic Attending  Case discussed with Dr. Basaraba at the time of the visit.  We reviewed the resident's history and exam and pertinent patient test results.  I agree with the assessment, diagnosis, and plan of care documented in the resident's note.    

## 2019-11-09 ENCOUNTER — Other Ambulatory Visit: Payer: Self-pay | Admitting: Internal Medicine

## 2019-11-11 NOTE — Telephone Encounter (Signed)
Pt was seen by Dr Charleen Kirks on 11/07/19.

## 2019-11-18 ENCOUNTER — Other Ambulatory Visit: Payer: Self-pay

## 2019-11-18 NOTE — Telephone Encounter (Signed)
oxyCODONE (OXY IR/ROXICODONE) 5 MG immediate release tablet, REFILL REQUEST @  [facility] - [place], [place] - [address] Phone:  [phone]  Fax:  [phone]

## 2019-11-19 ENCOUNTER — Other Ambulatory Visit: Payer: Self-pay | Admitting: Internal Medicine

## 2019-11-19 ENCOUNTER — Other Ambulatory Visit: Payer: Self-pay | Admitting: Physician Assistant

## 2019-11-19 DIAGNOSIS — E1121 Type 2 diabetes mellitus with diabetic nephropathy: Secondary | ICD-10-CM

## 2019-11-26 DIAGNOSIS — Z89512 Acquired absence of left leg below knee: Secondary | ICD-10-CM | POA: Diagnosis not present

## 2019-12-05 DIAGNOSIS — E113593 Type 2 diabetes mellitus with proliferative diabetic retinopathy without macular edema, bilateral: Secondary | ICD-10-CM | POA: Diagnosis not present

## 2019-12-05 DIAGNOSIS — H35033 Hypertensive retinopathy, bilateral: Secondary | ICD-10-CM | POA: Diagnosis not present

## 2019-12-05 DIAGNOSIS — H4311 Vitreous hemorrhage, right eye: Secondary | ICD-10-CM | POA: Diagnosis not present

## 2019-12-05 DIAGNOSIS — H25013 Cortical age-related cataract, bilateral: Secondary | ICD-10-CM | POA: Diagnosis not present

## 2019-12-05 DIAGNOSIS — H2513 Age-related nuclear cataract, bilateral: Secondary | ICD-10-CM | POA: Diagnosis not present

## 2019-12-05 LAB — HM DIABETES EYE EXAM

## 2019-12-06 ENCOUNTER — Encounter: Payer: Self-pay | Admitting: *Deleted

## 2019-12-09 ENCOUNTER — Other Ambulatory Visit: Payer: Self-pay | Admitting: Internal Medicine

## 2019-12-09 ENCOUNTER — Other Ambulatory Visit: Payer: Self-pay | Admitting: Physician Assistant

## 2019-12-09 DIAGNOSIS — G546 Phantom limb syndrome with pain: Secondary | ICD-10-CM

## 2019-12-09 DIAGNOSIS — M25561 Pain in right knee: Secondary | ICD-10-CM

## 2019-12-09 DIAGNOSIS — Z89512 Acquired absence of left leg below knee: Secondary | ICD-10-CM

## 2019-12-09 NOTE — Telephone Encounter (Signed)
Will need appt with PCP in August for further refills

## 2019-12-13 ENCOUNTER — Other Ambulatory Visit: Payer: Self-pay | Admitting: *Deleted

## 2019-12-13 ENCOUNTER — Telehealth: Payer: Self-pay | Admitting: Student

## 2019-12-13 DIAGNOSIS — H4311 Vitreous hemorrhage, right eye: Secondary | ICD-10-CM | POA: Diagnosis not present

## 2019-12-13 DIAGNOSIS — H3582 Retinal ischemia: Secondary | ICD-10-CM | POA: Diagnosis not present

## 2019-12-13 DIAGNOSIS — H25813 Combined forms of age-related cataract, bilateral: Secondary | ICD-10-CM | POA: Diagnosis not present

## 2019-12-13 DIAGNOSIS — E113531 Type 2 diabetes mellitus with proliferative diabetic retinopathy with traction retinal detachment not involving the macula, right eye: Secondary | ICD-10-CM | POA: Diagnosis not present

## 2019-12-13 DIAGNOSIS — E113392 Type 2 diabetes mellitus with moderate nonproliferative diabetic retinopathy without macular edema, left eye: Secondary | ICD-10-CM | POA: Diagnosis not present

## 2019-12-13 NOTE — Telephone Encounter (Signed)
Spoke with [name], who states that the request for refill for her percocet was due to miscommunication with pharmacy and mentions that she currently does not need a refill at the moment. She wanted to speak with a physician to confirm her upcoming appointment with the [facility] clinic. Informed [name] that she has an appointment with her PCP, Dr.Katsadouros on 8/17 at 8:45am. [name] expressed understanding.

## 2019-12-13 NOTE — Telephone Encounter (Signed)
Sent request in new encounter

## 2019-12-13 NOTE — Telephone Encounter (Signed)
Pt would like doctor to call her to discuss pain med.

## 2019-12-13 NOTE — Telephone Encounter (Signed)
Pls contact pt regarding pain medicine [phone]

## 2019-12-16 ENCOUNTER — Other Ambulatory Visit: Payer: Self-pay | Admitting: Internal Medicine

## 2019-12-16 DIAGNOSIS — E785 Hyperlipidemia, unspecified: Secondary | ICD-10-CM

## 2019-12-27 DIAGNOSIS — Z89512 Acquired absence of left leg below knee: Secondary | ICD-10-CM | POA: Diagnosis not present

## 2019-12-28 IMAGING — CR DG KNEE COMPLETE 4+V*L*
5 series · 5 of 5 positions shown · non-contrast
Comparison: MRI 02/13/2019

CLINICAL DATA: History of below-knee amputation of the left lower
extremity. Swelling of the distal stump

EXAM:
LEFT KNEE - COMPLETE 4+ VIEW

[knee ap]
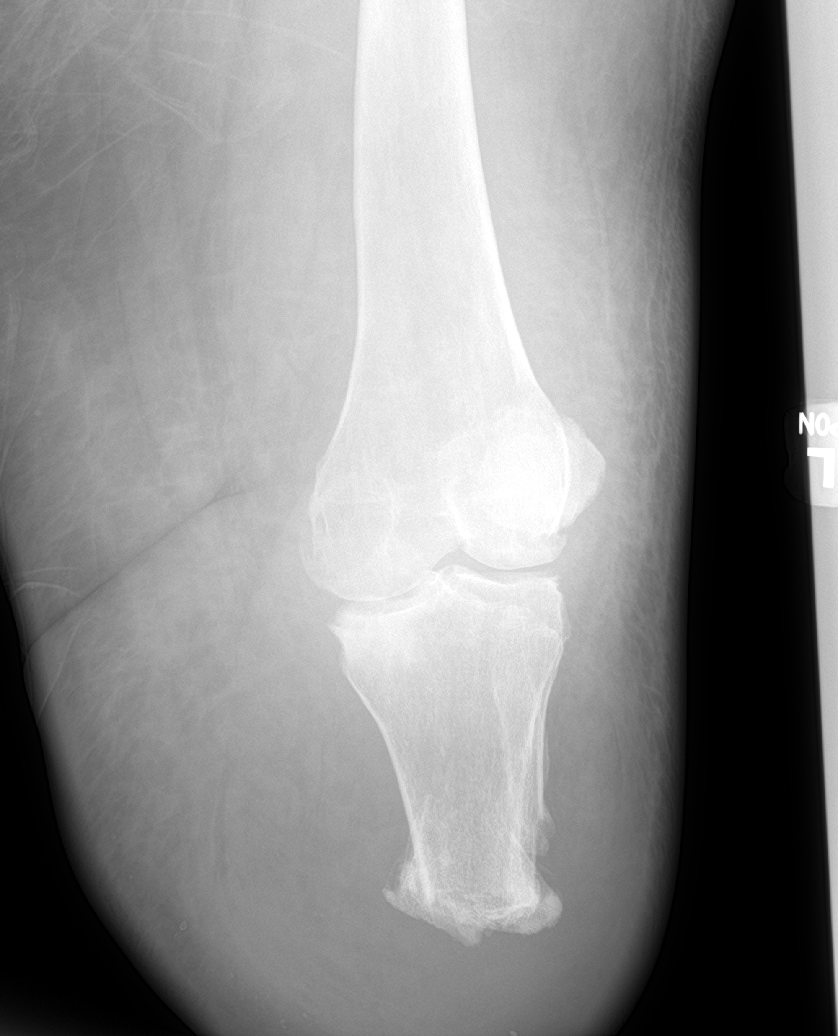

[knee obl (1 of 3)]
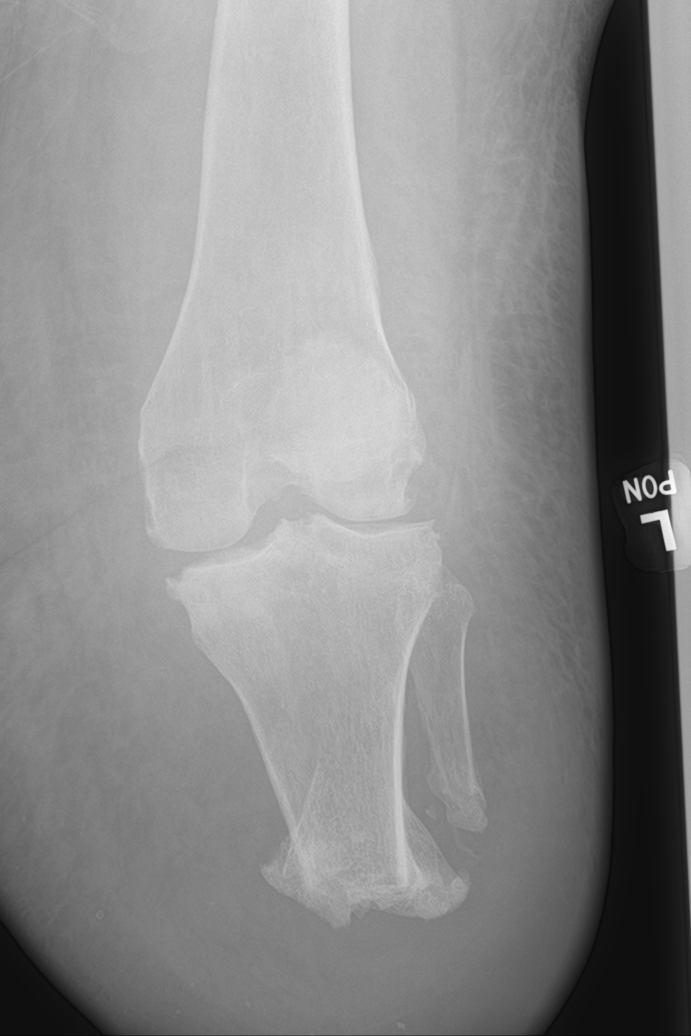

[knee obl (2 of 3)]
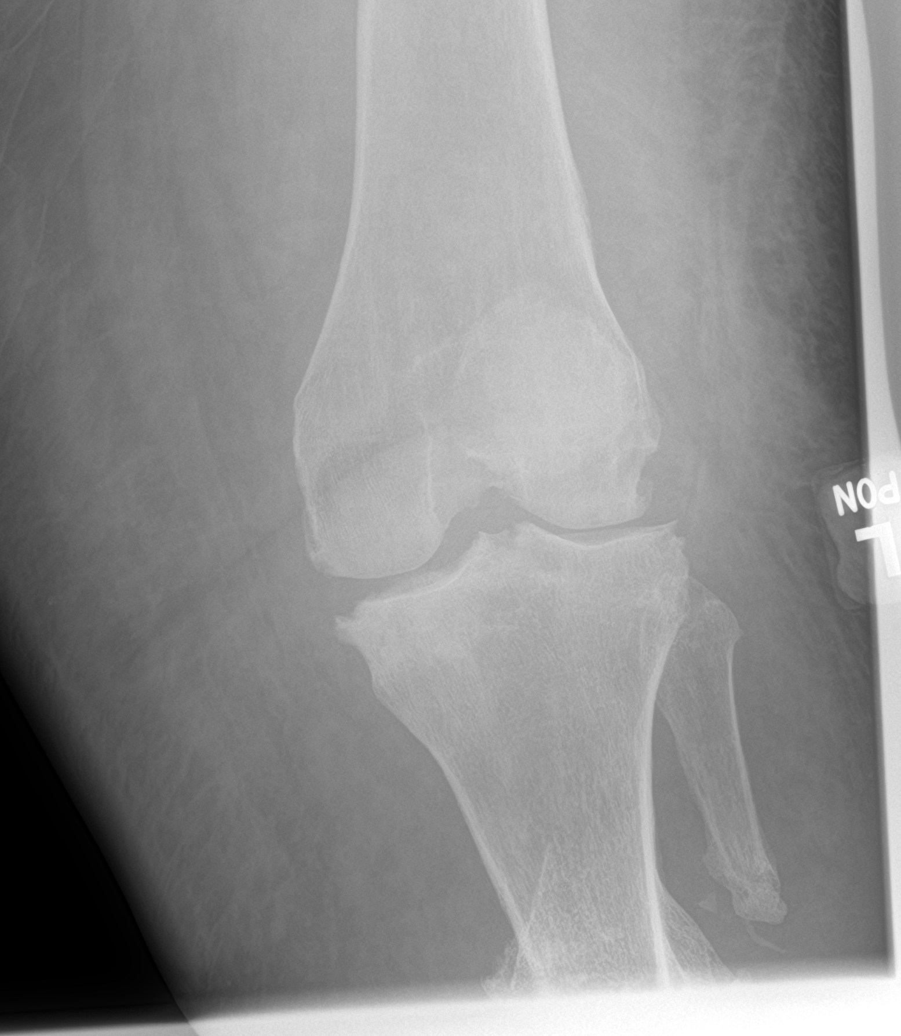

[knee obl (3 of 3)]
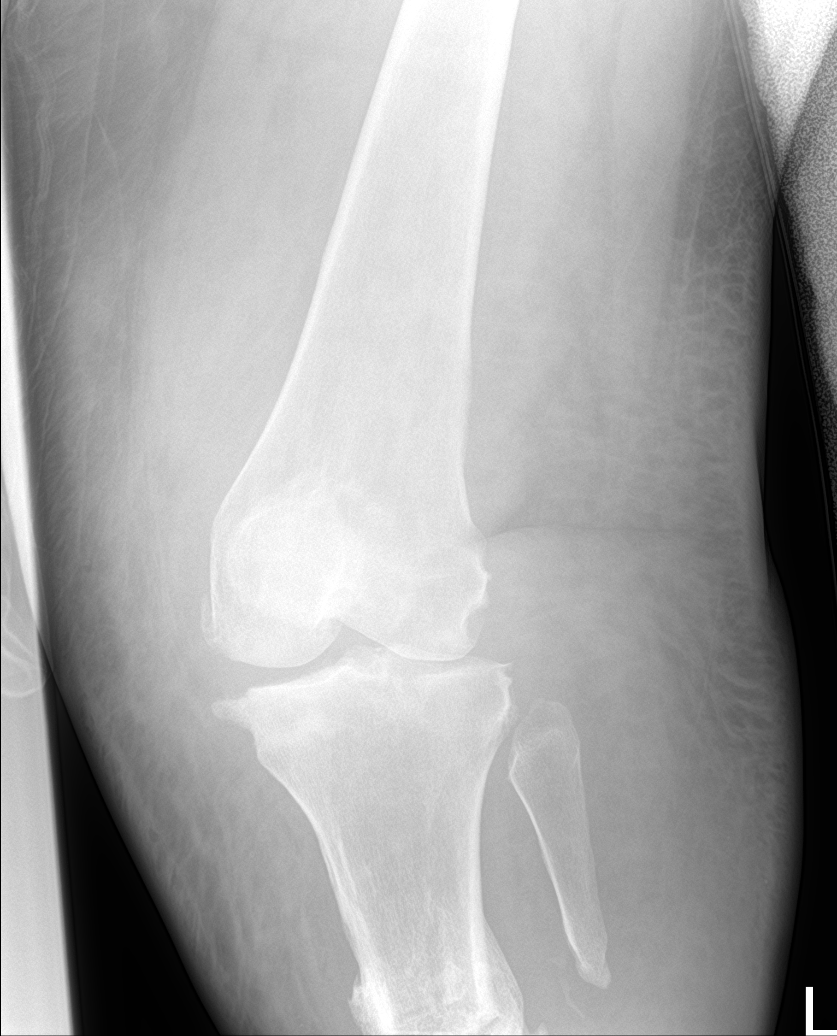

[knee lat]
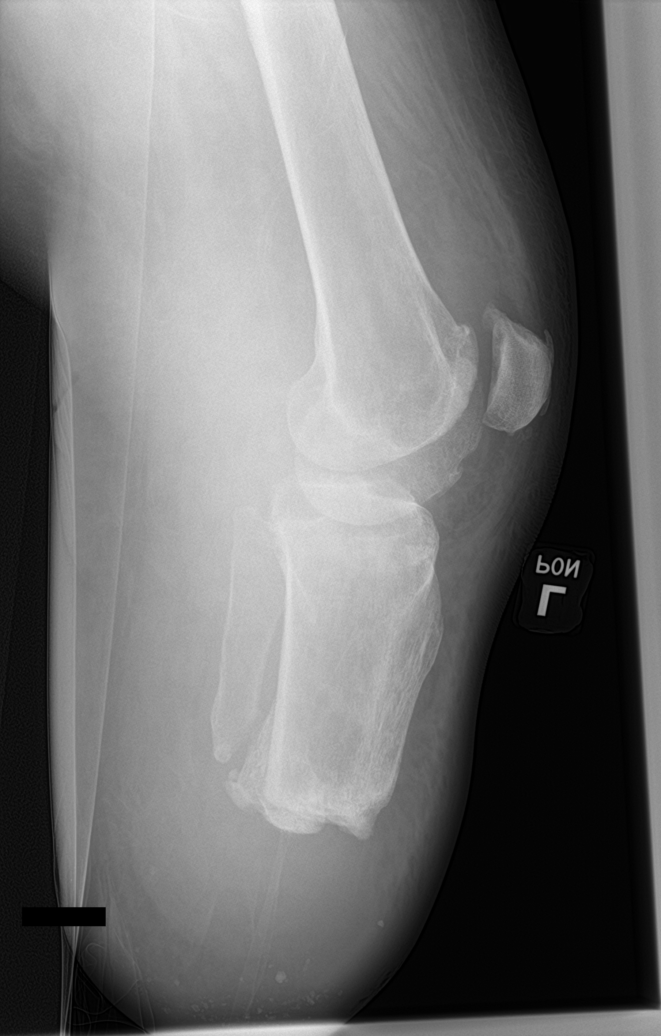

[5 of 5 positions shown; findings below may reference images not displayed]

FINDINGS: Prior below-knee amputation of the left lower extremity. Mild
hypertrophic osseous changes at the amputation site. No definite
cortical destruction or periostitis to suggest acute osteomyelitis.
Diffuse nonspecific soft tissue swelling and edema no soft tissue
gas. Small knee joint effusion. Tricompartmental osteoarthritis of
the left knee.
IMPRESSION: 1. No radiographic evidence of acute osteomyelitis.
2. Diffuse soft tissue swelling and edema. Correlate for cellulitis.
3. Prior below-knee amputation.
4. Small knee joint effusion.
5. Tricompartmental osteoarthritis of the knee.

## 2019-12-30 ENCOUNTER — Encounter (HOSPITAL_COMMUNITY): Payer: Self-pay | Admitting: Ophthalmology

## 2019-12-30 ENCOUNTER — Ambulatory Visit (INDEPENDENT_AMBULATORY_CARE_PROVIDER_SITE_OTHER): Payer: Medicare HMO | Admitting: Physician Assistant

## 2019-12-30 ENCOUNTER — Encounter: Payer: Self-pay | Admitting: Physician Assistant

## 2019-12-30 ENCOUNTER — Other Ambulatory Visit: Payer: Self-pay

## 2019-12-30 ENCOUNTER — Inpatient Hospital Stay (HOSPITAL_COMMUNITY): Admission: RE | Admit: 2019-12-30 | Payer: Medicare HMO | Source: Ambulatory Visit

## 2019-12-30 ENCOUNTER — Ambulatory Visit: Payer: Medicare HMO | Admitting: Orthopedic Surgery

## 2019-12-30 VITALS — Ht 70.0 in | Wt 266.0 lb

## 2019-12-30 DIAGNOSIS — Z89612 Acquired absence of left leg above knee: Secondary | ICD-10-CM

## 2019-12-30 NOTE — Progress Notes (Signed)
Office Visit Note   Patient: [name] [name]           Date of Birth: 14-Sep-1975           MRN: [id number] Visit Date: 12/30/2019              Requested by: Riesa Pope, MD [address] [place],  [place] [address] PCP: Riesa Pope, MD  Chief Complaint  Patient presents with  . Left Leg - Routine Post Op    10/11/19 left AKA       HPI: This is a pleasant woman who is status post above-knee amputation on the left.  She is overall doing well.  She needs an updated note to obtain her prescription for her computerized above-knee amputation.  She is a young patient and is very active caring for her children.  She needs to be on her feet for long periods of time on sometimes uneven surfaces.  She is very motivated to be able to ambulate and take care of her family she is also requesting a injection into her right knee.  She has had steroid injections in the past and done well.  Unfortunately she is scheduled for an eye surgery tomorrow  Assessment & Plan: Visit Diagnoses: No diagnosis found.  Plan: Patient will need to discuss steroid injection with her ophthalmologist.  I am fine giving her this later this week.  I do have concerns giving it to her the day before surgery she understands this.   Follow-Up Instructions: No follow-ups on file.   Ortho Exam  Patient is alert, oriented, no adenopathy, well-dressed, normal affect, normal respiratory effort. Left above-knee amputation.  She does have moderate swelling but incision is well-healed no cellulitis no tenderness compartments are soft Patient is a new left transfemoral amputee.  Patient's current comorbidities are not expected to impact the ability to function with the prescribed prosthesis. Patient verbally communicates a strong desire to use a prosthesis. Patient currently requires mobility aids to ambulate without a prosthesis.  Expects not to use mobility aids with a new prosthesis.  Patient is a K3 level  ambulator that spends a lot of time walking around on uneven terrain over obstacles, up and down stairs, and ambulates with a variable cadence.  The patient will benefit from an MPK knee because they frequently encounter uneven terrain, stairs, and sometimes have to walk backwards. The "stumble recovery" and "intuitive stance" features will reduce fall risk and allow the patient to walk down stairs "step over step."2  Imaging: No results found. No images are attached to the encounter.  Labs: Lab Results  Component Value Date   HGBA1C 8.5 (H) 09/04/2019   HGBA1C 7.0 (A) 05/30/2019   HGBA1C 6.6 (A) 02/28/2019   ESRSEDRATE 130 (H) 03/24/2017   ESRSEDRATE 53 (H) 09/14/2015   ESRSEDRATE 132 (H) 07/06/2012   CRP 13.6 (H) 02/14/2019   CRP 38.6 (H) 04/03/2018   CRP 5.4 (H) 09/14/2015   LABURIC 8.3 (H) 04/15/2019   REPTSTATUS 10/27/2019 FINAL 10/22/2019   GRAMSTAIN  09/04/2019    FEW WBC PRESENT, PREDOMINANTLY PMN MODERATE GRAM POSITIVE COCCI    CULT  10/22/2019    NO GROWTH 5 DAYS Performed at [facility], [place] [address] [place],  [address]    LABORGA STAPHYLOCOCCUS AUREUS 09/04/2019     Lab Results  Component Value Date   ALBUMIN 2.4 (L) 10/21/2019   ALBUMIN 2.3 (L) 09/18/2019   ALBUMIN 1.8 (L) 09/01/2019   LABURIC  8.3 (H) 04/15/2019    Lab Results  Component Value Date   MG 2.3 04/07/2018   MG 2.1 04/05/2018   MG 2.4 03/25/2017   No results found for: VD25OH  No results found for: PREALBUMIN CBC EXTENDED Latest Ref Rng & Units 11/07/2019 10/31/2019 10/30/2019  WBC 3.4 - 10.8 x10E3/uL 5.4 5.6 6.3  RBC 3.77 - 5.28 x10E6/uL 3.98 3.38(L) 3.05(L)  HGB 11.1 - 15.9 g/dL 10.1(L) 8.4(L) 7.7(L)  HCT 34.0 - 46.6 % 32.0(L) 27.5(L) 25.4(L)  PLT 150 - 450 x10E3/uL 291 173 190  NEUTROABS 1.7 - 7.7 K/uL - - -  LYMPHSABS 0.7 - 4.0 K/uL - - -     Body mass index is 38.17 kg/m.  Orders:  No orders of the defined types were placed in this encounter.  No orders of  the defined types were placed in this encounter.    Procedures: No procedures performed  Clinical Data: No additional findings.  ROS:  All other systems negative, except as noted in the HPI. Review of Systems  Objective: Vital Signs: Ht 5\' 10"  (1.778 m)   Wt 266 lb (120.7 kg)   BMI 38.17 kg/m   Specialty Comments:  No specialty comments available.  PMFS History: Patient Active Problem List   Diagnosis Date Noted  . Hx of AKA (above knee amputation), left ([place])   . Symptomatic anemia 10/21/2019  . Anemia 10/21/2019  . Acute respiratory failure with hypoxia ([place]) 10/21/2019  . Blurry vision, right eye 10/08/2019  . Tobacco use 03/01/2019  . Phantom limb pain ([place]) 11/08/2018  . Current moderate episode of major depressive disorder ([place])   . Hyperkalemia   . Anemia of chronic disease   . Severe protein-calorie malnutrition ([place])   . Fall   . Status post below knee amputation, left ([place]) 04/11/2017  . Bipolar affective disorder ([place])   . Heart failure with preserved ejection fraction ([place]) 02/09/2017  . Routine adult health maintenance 12/08/2016  . Chronic pain of right knee 09/08/2016  . Chronic kidney disease (CKD), stage III (moderate) 07/15/2016  . OSA (obstructive sleep apnea) 05/13/2016  . Morbid obesity due to excess calories ([place]) 11/27/2015  . Hypothyroidism 04/25/2013  . Controlled type 2 diabetes mellitus ([place]) 07/12/2012  . Carpal tunnel syndrome, bilateral 01/25/2012  . Diabetic polyneuropathy associated with type 2 diabetes mellitus ([place]) 07/04/2011  . Anxiety and depression 06/02/2011  . GERD 12/06/2007  . Hyperlipidemia 08/29/2007  . Hypertension associated with diabetes ([place]) 08/29/2007   Past Medical History:  Diagnosis Date  . Abdominal muscle pain 09/08/2016  . Abnormal Pap smear of cervix 2009  . Abscess    history of multiple abscesses  . Acute bilateral low back pain 02/13/2017  . Acute blood loss anemia   . Acute on chronic renal  failure ([place]) 07/12/2012  . Acute renal failure ([place]) 07/12/2012  . Adjustment disorder with depressed mood 03/26/2017  . AKI (acute kidney injury) ([place])   . Anemia of chronic disease 2002  . Anxiety    Panic attacks  . Bilateral lower extremity edema 05/13/2016  . Bipolar disorder ([place])   . Cellulitis 05/21/2014   right eye  . Chronic bronchitis ([place])    "get it q yr" (05/13/2013)  . Chronic pain   . Dehiscence of amputation stump ([place])   . Depression   . Edema of lower extremity   . Endocarditis 2002   subacute bacterial endocarditis.   . Family history of anesthesia complication    "my mom  has a hard time coming out from under"  . Fibromyalgia   . GERD (gastroesophageal reflux disease)    occ  . Heart murmur   . Herpes simplex type 1 infection 01/16/2018  . History of blood transfusion    "just low blood count" (05/13/2013)  . Hyperlipidemia   . Hypertension   . Hypothyroidism   . Hypothyroidism, adult 03/21/2014  . Hypoxia   . Leukocytosis   . Necrosis ([place])    and ulceration  . Necrotizing fasciitis s/p OR debridements 07/06/2012  . Obesity   . OSA on CPAP    does not wear all the time  . Peripheral neuropathy   . Type II diabetes mellitus (HCC)    Type  II  . Wound dehiscence 09/04/2019    Family History  Problem Relation Age of Onset  . Heart failure Mother   . Diabetes Mother   . Kidney disease Mother   . Kidney disease Father   . Diabetes Father   . Diabetes Paternal Grandmother   . Heart failure Paternal Grandmother   . Other Other     Past Surgical History:  Procedure Laterality Date  . ABDOMINAL AORTOGRAM W/LOWER EXTREMITY Left 03/19/2019   Procedure: ABDOMINAL AORTOGRAM W/LOWER EXTREMITY;  Surgeon: Serafina Mitchell, MD;  Location: [facility];  Service: Cardiovascular;  Laterality: Left;  . ABOVE KNEE LEG AMPUTATION Left 10/11/2019   wound dehisence   . AMPUTATION Left 04/07/2017   Procedure: LEFT BELOW KNEE AMPUTATION;  Surgeon: Newt Minion, MD;  Location: [facility];  Service: Orthopedics;  Laterality: Left;  . AMPUTATION Left 10/11/2019   Procedure: LEFT ABOVE KNEE AMPUTATION;  Surgeon: Newt Minion, MD;  Location: [facility];  Service: Orthopedics;  Laterality: Left;  . EYE SURGERY     lazer  . INCISION AND DRAINAGE ABSCESS     multiple I&Ds  . INCISION AND DRAINAGE ABSCESS Left 07/09/2012   Procedure: DRESSING CHANGE, THIGH WOUND;  Surgeon: Harl Bowie, MD;  Location: [place];  Service: General;  Laterality: Left;  . INCISION AND DRAINAGE OF WOUND Left 07/07/2012   Procedure: IRRIGATION AND DEBRIDEMENT WOUND;  Surgeon: Harl Bowie, MD;  Location: [facility];  Service: General;  Laterality: Left;  . INCISION AND DRAINAGE PERIRECTAL ABSCESS Left 07/14/2012   Procedure: DEBRIDEMENT OF SKIN & SOFT TISSUE; DRESSING CHANGE UNDER ANESTHESIA;  Surgeon: Gayland Curry, MD,FACS;  Location: [place];  Service: General;  Laterality: Left;  . INCISION AND DRAINAGE PERIRECTAL ABSCESS Left 07/16/2012   Procedure: I&D Left Thigh;  Surgeon: Gwenyth Ober, MD;  Location: [facility];  Service: General;  Laterality: Left;  . INCISION AND DRAINAGE PERIRECTAL ABSCESS N/A 01/05/2015   Procedure: IRRIGATION AND DEBRIDEMENT PERIRECTAL ABSCESS;  Surgeon: Donnie Mesa, MD;  Location: [place];  Service: General;  Laterality: N/A;  . IRRIGATION AND DEBRIDEMENT ABSCESS Left 07/06/2012   Procedure: IRRIGATION AND DEBRIDEMENT ABSCESS BUTTOCKS AND THIGH;  Surgeon: Shann Medal, MD;  Location: [place];  Service: General;  Laterality: Left;  . IRRIGATION AND DEBRIDEMENT ABSCESS Left 08/10/2012   Procedure: IRRIGATION AND DEBRIDEMENT ABSCESS;  Surgeon: Madilyn Hook, DO;  Location: [place];  Service: General;  Laterality: Left;  . STUMP REVISION Left 06/09/2017   Procedure: REVISION LEFT BELOW KNEE AMPUTATION;  Surgeon: Newt Minion, MD;  Location: [place];  Service: Orthopedics;  Laterality: Left;  . STUMP REVISION Left 09/04/2019   Procedure: LEFT BELOW KNEE AMPUTATION  REVISION;  Surgeon: Meridee Score  V, MD;  Location: [place];  Service: Orthopedics;  Laterality: Left;  . STUMP REVISION Left 09/20/2019   Procedure: LEFT BELOW KNEE AMPUTATION REVISION;  Surgeon: Newt Minion, MD;  Location: [place];  Service: Orthopedics;  Laterality: Left;  . TONSILLECTOMY  1994   Social History   Occupational History  . Occupation: disability  Tobacco Use  . Smoking status: Former Smoker    Packs/day: 0.50    Years: 16.00    Pack years: 8.00    Types: Cigarettes  . Smokeless tobacco: Former Systems developer    Quit date: 09/16/2019  . Tobacco comment: quit smoking 3wks ago  Vaping Use  . Vaping Use: Former  . Devices: CBD oil  Substance and Sexual Activity  . Alcohol use: Yes    Alcohol/week: 0.0 standard drinks    Comment: Socially-" monthly  maybe"  . Drug use: No  . Sexual activity: Yes

## 2019-12-30 NOTE — Progress Notes (Signed)
Anesthesia Chart Review: Same day workup  PMH of CKD3b, GERD, HTN, HFpEF, Hypothyroidism, OSA, DM2, and recent AKA.  Recently admitted to [facility] 10/21/2019 with symptomatic anemia, HCAP, acute on chronic diastolic heart failure, acute on chronic CKD.  Presented to [facility] w/ hemoglobin of 5.7. Initially thoughtto be post-operative anemia, she was also found to have iron sat of 4% w/ ferritin of 64 and increased reticulocyte count. She was given 2 units of blood and 2x weekly iron infusion. Over the course of hospitalization, she was noted to have recurrent drop in hemoglobin. CT pelvis and thigh was performed which ruled out any retroperitoneal bleed or expanding hematoma from surgical site. She was noted to be on menstrual cycle and was given 2 additional units of blood as well as erythropoietin during her hospitalization. She was discharged once her hemoglobin stabilized around 8.  She was also treated for HCAP, her oxygenation improved and her fever resolved.  She had significant edema with elevated BNP on admission. Echocardiogram was performed showing intact systolic function with EF of 60-65%. She was treated with IV furosemide intermittently to titrate for euvolemia in setting of repeat blood administrations.  After discharge she followed up with her PCP on 11/07/2019 and had repeat CBC showing hemoglobin 10.1.  Creatinine was stable at 2.06.  Per note, she has minimal leg swelling and no shortness of breath.  Tolerating Bumex well.  Hydralazine was added to current regimen for better blood pressure control.  Last A1c 8.5 on 09/04/2019.  OSA on CPAP.  Will need day of surgery labs and eval.  EKG 10/21/2019: NSR.  Rate 93.  Low voltage QRS.  TTE 10/22/2019: 1. Left ventricular ejection fraction, by estimation, is 60 to 65%. The  left ventricle has normal function. The left ventricle has no regional  wall motion abnormalities. Left ventricular diastolic parameters are  indeterminate.  2. Right  ventricular systolic function is normal. The right ventricular  size is not well visualized.  3. Left atrial size was mildly dilated.  4. Right atrial size was mildly dilated.  5. The mitral valve is normal in structure. Trivial mitral valve  regurgitation. No evidence of mitral stenosis.  6. The aortic valve has an indeterminant number of cusps. Aortic valve  regurgitation is not visualized. No aortic stenosis is present.  7. The inferior vena cava is dilated in size with <50% respiratory  variability, suggesting right atrial pressure of 15 mmHg.   Conclusion(s)/Recommendation(s): Normal biventricular function without  evidence of hemodynamically significant valvular heart disease.    Wynonia Musty [facility] Short Stay Center/Anesthesiology Phone [phone] 12/30/2019 3:24 PM

## 2019-12-30 NOTE — Anesthesia Preprocedure Evaluation (Addendum)
Anesthesia Evaluation  Patient identified by MRN, date of birth, ID band Patient awake    Reviewed: Allergy & Precautions, NPO status , Patient's Chart, lab work & pertinent test results  Airway Mallampati: II  TM Distance: >3 FB     Dental  (+) Teeth Intact   Pulmonary former smoker,    breath sounds clear to auscultation       Cardiovascular hypertension,  Rhythm:Regular Rate:Normal     Neuro/Psych    GI/Hepatic   Endo/Other  diabetes  Renal/GU      Musculoskeletal   Abdominal (+) + obese,   Peds  Hematology   Anesthesia Other Findings   Reproductive/Obstetrics                            Anesthesia Physical Anesthesia Plan  ASA: III  Anesthesia Plan: MAC   Post-op Pain Management:    Induction: Intravenous  PONV Risk Score and Plan: Ondansetron and Propofol infusion  Airway Management Planned: Natural Airway and Simple Face Mask  Additional Equipment:   Intra-op Plan:   Post-operative Plan:   Informed Consent: I have reviewed the patients History and Physical, chart, labs and discussed the procedure including the risks, benefits and alternatives for the proposed anesthesia with the patient or authorized representative who has indicated his/her understanding and acceptance.       Plan Discussed with: CRNA and Anesthesiologist  Anesthesia Plan Comments: (PAT note by Karoline Caldwell, PA-C: PMH of CKD3b, GERD, HTN, HFpEF, Hypothyroidism, OSA, DM2, and recent AKA.  Recently admitted to [facility] 10/21/2019 with symptomatic anemia, HCAP, acute on chronic diastolic heart failure, acute on chronic CKD.  Presented to [facility] w/ hemoglobin of 5.7. Initially thoughtto be post-operative anemia, she was also found to have iron sat of 4% w/ ferritin of 64 and increased reticulocyte count. She was given 2 units of blood and 2x weekly iron infusion. Over the course of hospitalization, she was noted to  have recurrent drop in hemoglobin. CT pelvis and thigh was performed which ruled out any retroperitoneal bleed or expanding hematoma from surgical site. She was noted to be on menstrual cycle and was given 2 additional units of blood as well as erythropoietin during her hospitalization. She was discharged once her hemoglobin stabilized around 8.  She was also treated for HCAP, her oxygenation improved and her fever resolved.  She had significant edema with elevated BNP on admission. Echocardiogram was performed showing intact systolic function with EF of 60-65%. She was treated with IV furosemide intermittently to titrate for euvolemia in setting of repeat blood administrations.  After discharge she followed up with her PCP on 11/07/2019 and had repeat CBC showing hemoglobin 10.1.  Creatinine was stable at 2.06.  Per note, she has minimal leg swelling and no shortness of breath.  Tolerating Bumex well.  Hydralazine was added to current regimen for better blood pressure control.  Last A1c 8.5 on 09/04/2019.  OSA on CPAP.  Will need day of surgery labs and eval.  EKG 10/21/2019: NSR.  Rate 93.  Low voltage QRS.  TTE 10/22/2019: 1. Left ventricular ejection fraction, by estimation, is 60 to 65%. The  left ventricle has normal function. The left ventricle has no regional  wall motion abnormalities. Left ventricular diastolic parameters are  indeterminate.  2. Right ventricular systolic function is normal. The right ventricular  size is not well visualized.  3. Left atrial size was mildly dilated.  4. Right atrial size  was mildly dilated.  5. The mitral valve is normal in structure. Trivial mitral valve  regurgitation. No evidence of mitral stenosis.  6. The aortic valve has an indeterminant number of cusps. Aortic valve  regurgitation is not visualized. No aortic stenosis is present.  7. The inferior vena cava is dilated in size with <50% respiratory  variability, suggesting right atrial  pressure of 15 mmHg.   Conclusion(s)/Recommendation(s): Normal biventricular function without  evidence of hemodynamically significant valvular heart disease.  )       Anesthesia Quick Evaluation

## 2019-12-30 NOTE — Progress Notes (Signed)
Pt denies SOB, chest pain, and being under the care of a cardiologist. Pt stated that she receives primary care at [facility]. Pt denies having a stress test and cardiac cath. Pt denies recent labs. Pt made aware to stop taking taking Aspirin (unless otherwise advised by surgeon), vitamins, fish oil and herbal medications. Do not take any NSAIDs ie: Ibuprofen, Advil, Naproxen (Aleve), Motrin, BC and Goody Powder. Pt stated that her surgery was switched to [facility] because she had no one to care for her. Pt stated that she will call surgeon's office to confirm that she will not be discharged DOS since she has no caregiver. PA, Anesthesiology, asked to review pt chest x ray. Pt reminded to quarantine. Pt verbalized understanding of all pre-op instructions.

## 2019-12-31 ENCOUNTER — Encounter (HOSPITAL_COMMUNITY): Payer: Self-pay | Admitting: Ophthalmology

## 2019-12-31 ENCOUNTER — Encounter (HOSPITAL_COMMUNITY)
Admission: RE | Disposition: A | Discharge status: Home / Self Care | Payer: Self-pay | Source: Home / Self Care | Attending: Ophthalmology

## 2019-12-31 ENCOUNTER — Ambulatory Visit (HOSPITAL_COMMUNITY): Payer: Medicare HMO | Admitting: Physician Assistant

## 2019-12-31 ENCOUNTER — Ambulatory Visit (HOSPITAL_COMMUNITY)
Admission: RE | Admit: 2019-12-31 | Discharge: 2019-12-31 | Disposition: A | Discharge status: Home / Self Care | Payer: Medicare HMO | Attending: Ophthalmology | Admitting: Ophthalmology

## 2019-12-31 DIAGNOSIS — E669 Obesity, unspecified: Secondary | ICD-10-CM | POA: Insufficient documentation

## 2019-12-31 DIAGNOSIS — E113521 Type 2 diabetes mellitus with proliferative diabetic retinopathy with traction retinal detachment involving the macula, right eye: Secondary | ICD-10-CM | POA: Insufficient documentation

## 2019-12-31 DIAGNOSIS — N1832 Chronic kidney disease, stage 3b: Secondary | ICD-10-CM | POA: Diagnosis not present

## 2019-12-31 DIAGNOSIS — I130 Hypertensive heart and chronic kidney disease with heart failure and stage 1 through stage 4 chronic kidney disease, or unspecified chronic kidney disease: Secondary | ICD-10-CM | POA: Insufficient documentation

## 2019-12-31 DIAGNOSIS — E1122 Type 2 diabetes mellitus with diabetic chronic kidney disease: Secondary | ICD-10-CM | POA: Diagnosis not present

## 2019-12-31 DIAGNOSIS — E1136 Type 2 diabetes mellitus with diabetic cataract: Secondary | ICD-10-CM | POA: Insufficient documentation

## 2019-12-31 DIAGNOSIS — N183 Chronic kidney disease, stage 3 unspecified: Secondary | ICD-10-CM | POA: Diagnosis not present

## 2019-12-31 DIAGNOSIS — Z89612 Acquired absence of left leg above knee: Secondary | ICD-10-CM | POA: Diagnosis not present

## 2019-12-31 DIAGNOSIS — H4311 Vitreous hemorrhage, right eye: Secondary | ICD-10-CM | POA: Diagnosis not present

## 2019-12-31 DIAGNOSIS — Z20822 Contact with and (suspected) exposure to covid-19: Secondary | ICD-10-CM | POA: Insufficient documentation

## 2019-12-31 DIAGNOSIS — Z6838 Body mass index (BMI) 38.0-38.9, adult: Secondary | ICD-10-CM | POA: Diagnosis not present

## 2019-12-31 DIAGNOSIS — E1140 Type 2 diabetes mellitus with diabetic neuropathy, unspecified: Secondary | ICD-10-CM | POA: Diagnosis not present

## 2019-12-31 DIAGNOSIS — D631 Anemia in chronic kidney disease: Secondary | ICD-10-CM | POA: Diagnosis not present

## 2019-12-31 DIAGNOSIS — Z87891 Personal history of nicotine dependence: Secondary | ICD-10-CM | POA: Diagnosis not present

## 2019-12-31 DIAGNOSIS — G4733 Obstructive sleep apnea (adult) (pediatric): Secondary | ICD-10-CM | POA: Insufficient documentation

## 2019-12-31 DIAGNOSIS — I5032 Chronic diastolic (congestive) heart failure: Secondary | ICD-10-CM | POA: Diagnosis not present

## 2019-12-31 DIAGNOSIS — I5030 Unspecified diastolic (congestive) heart failure: Secondary | ICD-10-CM | POA: Diagnosis not present

## 2019-12-31 DIAGNOSIS — E113531 Type 2 diabetes mellitus with proliferative diabetic retinopathy with traction retinal detachment not involving the macula, right eye: Secondary | ICD-10-CM | POA: Diagnosis not present

## 2019-12-31 HISTORY — DX: Unspecified cataract: H26.9

## 2019-12-31 HISTORY — PX: GAS INSERTION: SHX5336

## 2019-12-31 HISTORY — PX: PARS PLANA VITRECTOMY: SHX2166

## 2019-12-31 HISTORY — PX: PHOTOCOAGULATION WITH LASER: SHX6027

## 2019-12-31 HISTORY — PX: REPAIR OF COMPLEX TRACTION RETINAL DETACHMENT: SHX6217

## 2019-12-31 HISTORY — PX: AIR/FLUID EXCHANGE: SHX6494

## 2019-12-31 HISTORY — DX: Pneumonia, unspecified organism: J18.9

## 2019-12-31 LAB — CBC
HCT: 32.3 % — ABNORMAL LOW (ref 36.0–46.0)
Hemoglobin: 10 g/dL — ABNORMAL LOW (ref 12.0–15.0)
MCH: 25.4 pg — ABNORMAL LOW (ref 26.0–34.0)
MCHC: 31 g/dL (ref 30.0–36.0)
MCV: 82.2 fL (ref 80.0–100.0)
Platelets: 222 10*3/uL (ref 150–400)
RBC: 3.93 MIL/uL (ref 3.87–5.11)
RDW: 15 % (ref 11.5–15.5)
WBC: 6 10*3/uL (ref 4.0–10.5)
nRBC: 0 % (ref 0.0–0.2)

## 2019-12-31 LAB — BASIC METABOLIC PANEL
Anion gap: 9 (ref 5–15)
BUN: 32 mg/dL — ABNORMAL HIGH (ref 6–20)
CO2: 20 mmol/L — ABNORMAL LOW (ref 22–32)
Calcium: 8.8 mg/dL — ABNORMAL LOW (ref 8.9–10.3)
Chloride: 111 mmol/L (ref 98–111)
Creatinine, Ser: 2.04 mg/dL — ABNORMAL HIGH (ref 0.44–1.00)
GFR calc Af Amer: 34 mL/min — ABNORMAL LOW (ref >60)
GFR calc non Af Amer: 29 mL/min — ABNORMAL LOW (ref >60)
Glucose, Bld: 140 mg/dL — ABNORMAL HIGH (ref 70–99)
Potassium: 5.1 mmol/L (ref 3.5–5.1)
Sodium: 140 mmol/L (ref 135–145)

## 2019-12-31 LAB — GLUCOSE, CAPILLARY
Glucose-Capillary: 127 mg/dL — ABNORMAL HIGH (ref 70–99)
Glucose-Capillary: 114 mg/dL — ABNORMAL HIGH (ref 70–99)

## 2019-12-31 LAB — POCT PREGNANCY, URINE: Preg Test, Ur: NEGATIVE

## 2019-12-31 LAB — SARS CORONAVIRUS 2 BY RT PCR (HOSPITAL ORDER, PERFORMED IN ~~LOC~~ HOSPITAL LAB): SARS Coronavirus 2: NEGATIVE

## 2019-12-31 SURGERY — REPAIR, RETINAL DETACHMENT, COMPLEX
Anesthesia: Monitor Anesthesia Care | Site: Eye | Laterality: Right

## 2019-12-31 MED ORDER — DEXAMETHASONE SODIUM PHOSPHATE 10 MG/ML IJ SOLN
INTRAMUSCULAR | Status: DC | PRN
  Administered 2019-12-31: 10 mg

## 2019-12-31 MED ORDER — CHLORHEXIDINE GLUCONATE 0.12 % MT SOLN: 15.0000 mL | Freq: Once | OROMUCOSAL | Status: DC

## 2019-12-31 MED ORDER — BSS PLUS IO SOLN
INTRAOCULAR | Status: AC
  Filled 2019-12-31: qty 500

## 2019-12-31 MED ORDER — LIDOCAINE HCL 2 % IJ SOLN
INTRAMUSCULAR | Status: DC | PRN
  Administered 2019-12-31: 6 mL via RETROBULBAR

## 2019-12-31 MED ORDER — BUPIVACAINE HCL (PF) 0.75 % IJ SOLN
INTRAMUSCULAR | Status: AC
  Filled 2019-12-31: qty 10

## 2019-12-31 MED ORDER — FENTANYL CITRATE (PF) 250 MCG/5ML IJ SOLN
INTRAMUSCULAR | Status: AC
  Filled 2019-12-31: qty 5

## 2019-12-31 MED ORDER — PROPOFOL 10 MG/ML IV BOLUS
INTRAVENOUS | Status: DC | PRN
  Administered 2019-12-31: 20 mg via INTRAVENOUS
  Administered 2019-12-31: 10 mg via INTRAVENOUS

## 2019-12-31 MED ORDER — LABETALOL HCL 5 MG/ML IV SOLN
5.0000 mg | INTRAVENOUS | Status: DC | PRN
  Administered 2019-12-31: 5 mg via INTRAVENOUS

## 2019-12-31 MED ORDER — TOBRAMYCIN-DEXAMETHASONE 0.3-0.1 % OP OINT
TOPICAL_OINTMENT | OPHTHALMIC | Status: AC
  Filled 2019-12-31: qty 3.5

## 2019-12-31 MED ORDER — OXYCODONE HCL 5 MG/5ML PO SOLN: 5.0000 mg | Freq: Once | ORAL | Status: DC | PRN

## 2019-12-31 MED ORDER — OFLOXACIN 0.3 % OP SOLN
1.0000 [drp] | OPHTHALMIC | Status: AC | PRN
  Administered 2019-12-31 (×3): 1 [drp] via OPHTHALMIC
  Filled 2019-12-31: qty 5

## 2019-12-31 MED ORDER — ONDANSETRON HCL 4 MG/2ML IJ SOLN: 4.0000 mg | Freq: Once | INTRAMUSCULAR | Status: DC | PRN

## 2019-12-31 MED ORDER — INDOCYANINE GREEN 25 MG IV SOLR
INTRAVENOUS | Status: AC
  Filled 2019-12-31: qty 10

## 2019-12-31 MED ORDER — HYALURONIDASE HUMAN 150 UNIT/ML IJ SOLN
INTRAMUSCULAR | Status: AC
  Filled 2019-12-31: qty 1

## 2019-12-31 MED ORDER — FENTANYL CITRATE (PF) 100 MCG/2ML IJ SOLN: 25.0000 ug | INTRAMUSCULAR | Status: DC | PRN

## 2019-12-31 MED ORDER — CYCLOPENTOLATE HCL 1 % OP SOLN
1.0000 [drp] | OPHTHALMIC | Status: AC | PRN
  Administered 2019-12-31 (×3): 1 [drp] via OPHTHALMIC
  Filled 2019-12-31: qty 2

## 2019-12-31 MED ORDER — MIDAZOLAM HCL 2 MG/2ML IJ SOLN
INTRAMUSCULAR | Status: DC | PRN
  Administered 2019-12-31: 2 mg via INTRAVENOUS

## 2019-12-31 MED ORDER — TETRACAINE HCL 0.5 % OP SOLN
OPHTHALMIC | Status: DC | PRN
  Administered 2019-12-31: 1 [drp] via OPHTHALMIC

## 2019-12-31 MED ORDER — CEFAZOLIN SUBCONJUNCTIVAL INJECTION 100 MG/0.5 ML
INJECTION | SUBCONJUNCTIVAL | Status: DC | PRN
  Administered 2019-12-31: 200 mg via SUBCONJUNCTIVAL

## 2019-12-31 MED ORDER — NA CHONDROIT SULF-NA HYALURON 40-30 MG/ML IO SOLN
INTRAOCULAR | Status: AC
  Filled 2019-12-31: qty 0.5

## 2019-12-31 MED ORDER — TOBRAMYCIN-DEXAMETHASONE 0.3-0.1 % OP OINT
TOPICAL_OINTMENT | OPHTHALMIC | Status: DC | PRN
  Administered 2019-12-31: 1 via OPHTHALMIC

## 2019-12-31 MED ORDER — TETRACAINE HCL 0.5 % OP SOLN
OPHTHALMIC | Status: AC
  Filled 2019-12-31: qty 4

## 2019-12-31 MED ORDER — ATROPINE SULFATE 1 % OP SOLN
OPHTHALMIC | Status: AC
  Filled 2019-12-31: qty 5

## 2019-12-31 MED ORDER — MIDAZOLAM HCL 2 MG/2ML IJ SOLN
INTRAMUSCULAR | Status: AC
  Filled 2019-12-31: qty 2

## 2019-12-31 MED ORDER — BSS IO SOLN
INTRAOCULAR | Status: AC
  Filled 2019-12-31: qty 15

## 2019-12-31 MED ORDER — PROPOFOL 10 MG/ML IV BOLUS
INTRAVENOUS | Status: AC
  Filled 2019-12-31: qty 20

## 2019-12-31 MED ORDER — BSS IO SOLN
INTRAOCULAR | Status: DC | PRN
  Administered 2019-12-31: 15 mL via INTRAOCULAR

## 2019-12-31 MED ORDER — ONDANSETRON HCL 4 MG/2ML IJ SOLN
INTRAMUSCULAR | Status: DC | PRN
  Administered 2019-12-31: 4 mg via INTRAVENOUS

## 2019-12-31 MED ORDER — EPINEPHRINE PF 1 MG/ML IJ SOLN
INTRAMUSCULAR | Status: AC
  Filled 2019-12-31: qty 1

## 2019-12-31 MED ORDER — LIDOCAINE HCL 2 % IJ SOLN
INTRAMUSCULAR | Status: AC
  Filled 2019-12-31: qty 20

## 2019-12-31 MED ORDER — PHENYLEPHRINE HCL 2.5 % OP SOLN
1.0000 [drp] | OPHTHALMIC | Status: AC | PRN
  Administered 2019-12-31 (×3): 1 [drp] via OPHTHALMIC
  Filled 2019-12-31: qty 2

## 2019-12-31 MED ORDER — NA CHONDROIT SULF-NA HYALURON 40-30 MG/ML IO SOLN
INTRAOCULAR | Status: DC | PRN
  Administered 2019-12-31: 0.5 mL via INTRAOCULAR

## 2019-12-31 MED ORDER — HYDROMORPHONE HCL 1 MG/ML IJ SOLN
0.2500 mg | INTRAMUSCULAR | Status: DC | PRN
  Administered 2019-12-31: 0.5 mg via INTRAVENOUS

## 2019-12-31 MED ORDER — ORAL CARE MOUTH RINSE: 15.0000 mL | Freq: Once | OROMUCOSAL | Status: DC

## 2019-12-31 MED ORDER — CEFAZOLIN SUBCONJUNCTIVAL INJECTION 100 MG/0.5 ML
100.0000 mg | INJECTION | SUBCONJUNCTIVAL | Status: DC
  Filled 2019-12-31: qty 5

## 2019-12-31 MED ORDER — BSS PLUS IO SOLN
INTRAOCULAR | Status: DC | PRN
  Administered 2019-12-31: 1 via INTRAOCULAR

## 2019-12-31 MED ORDER — HYDROMORPHONE HCL 1 MG/ML IJ SOLN
INTRAMUSCULAR | Status: AC
  Administered 2019-12-31: 0.5 mg via INTRAVENOUS
  Filled 2019-12-31: qty 1

## 2019-12-31 MED ORDER — FENTANYL CITRATE (PF) 250 MCG/5ML IJ SOLN
INTRAMUSCULAR | Status: DC | PRN
  Administered 2019-12-31: 50 ug via INTRAVENOUS
  Administered 2019-12-31: 25 ug via INTRAVENOUS
  Administered 2019-12-31 (×2): 50 ug via INTRAVENOUS
  Administered 2019-12-31: 25 ug via INTRAVENOUS
  Administered 2019-12-31: 50 ug via INTRAVENOUS

## 2019-12-31 MED ORDER — SODIUM CHLORIDE 0.9 % IV SOLN: INTRAVENOUS | Status: DC

## 2019-12-31 MED ORDER — LABETALOL HCL 5 MG/ML IV SOLN
INTRAVENOUS | Status: AC
  Administered 2019-12-31: 5 mg via INTRAVENOUS
  Filled 2019-12-31: qty 4

## 2019-12-31 MED ORDER — TROPICAMIDE 1 % OP SOLN
1.0000 [drp] | OPHTHALMIC | Status: AC | PRN
  Administered 2019-12-31 (×3): 1 [drp] via OPHTHALMIC
  Filled 2019-12-31: qty 15

## 2019-12-31 MED ORDER — LABETALOL HCL 5 MG/ML IV SOLN
INTRAVENOUS | Status: DC | PRN
  Administered 2019-12-31 (×2): 5 mg via INTRAVENOUS

## 2019-12-31 MED ORDER — FENTANYL CITRATE (PF) 100 MCG/2ML IJ SOLN
INTRAMUSCULAR | Status: AC
  Administered 2019-12-31: 50 ug via INTRAVENOUS
  Filled 2019-12-31: qty 2

## 2019-12-31 MED ORDER — OXYCODONE HCL 5 MG PO TABS: 5.0000 mg | ORAL_TABLET | Freq: Once | ORAL | Status: DC | PRN

## 2019-12-31 MED ORDER — SODIUM HYALURONATE 10 MG/ML IO SOLN
INTRAOCULAR | Status: AC
  Filled 2019-12-31: qty 0.85

## 2019-12-31 MED ORDER — EPINEPHRINE PF 1 MG/ML IJ SOLN
INTRAOCULAR | Status: DC | PRN
  Administered 2019-12-31: .3 mL

## 2019-12-31 MED ORDER — DEXAMETHASONE SODIUM PHOSPHATE 10 MG/ML IJ SOLN
INTRAMUSCULAR | Status: AC
  Filled 2019-12-31: qty 1

## 2019-12-31 SURGICAL SUPPLY — 69 items
APL SWBSTK 6 STRL LF DISP (MISCELLANEOUS) ×1
APPLICATOR COTTON TIP 6 STRL (MISCELLANEOUS) ×1 IMPLANT
APPLICATOR COTTON TIP 6IN STRL (MISCELLANEOUS) ×3
BAND WRIST GAS GREEN (MISCELLANEOUS) IMPLANT
BLADE MVR KNIFE 20G (BLADE) IMPLANT
BLADE STAB KNIFE 15DEG (BLADE) IMPLANT
CANNULA ANT CHAM MAIN (OPHTHALMIC RELATED) ×2 IMPLANT
CANNULA DUAL BORE 23G (CANNULA) IMPLANT
CANNULA DUALBORE 25G (CANNULA) IMPLANT
CANNULA VLV SOFT TIP 25G (OPHTHALMIC) ×1 IMPLANT
CANNULA VLV SOFT TIP 25GA (OPHTHALMIC) ×3 IMPLANT
CAUTERY EYE LOW TEMP 1300F FIN (OPHTHALMIC RELATED) IMPLANT
CLOSURE STERI-STRIP 1/2X4 (GAUZE/BANDAGES/DRESSINGS) ×1
CLSR STERI-STRIP ANTIMIC 1/2X4 (GAUZE/BANDAGES/DRESSINGS) ×2 IMPLANT
COVER MAYO STAND STRL (DRAPES) ×2 IMPLANT
DRAPE HALF SHEET 40X57 (DRAPES) ×3 IMPLANT
DRAPE INCISE 51X51 W/FILM STRL (DRAPES) ×2 IMPLANT
DRAPE RETRACTOR (MISCELLANEOUS) ×3 IMPLANT
ERASER HMR WETFIELD 23G BP (MISCELLANEOUS) IMPLANT
FORCEPS ECKARDT ILM 25G SERR (OPHTHALMIC RELATED) IMPLANT
FORCEPS GRIESHABER ILM 25G A (INSTRUMENTS) ×2 IMPLANT
GAS AUTO FILL CONSTEL (OPHTHALMIC) ×3
GAS AUTO FILL CONSTELLATION (OPHTHALMIC) IMPLANT
GAS IO SF6 125GM ISPAN CNSTL (MISCELLANEOUS) IMPLANT
GAS TANK CONSTELL (MISCELLANEOUS) ×3
GAS WRIST BAND GREEN (MISCELLANEOUS)
GLOVE SURG SYN 7.5  E (GLOVE) ×3
GLOVE SURG SYN 7.5 E (GLOVE) ×1 IMPLANT
GLOVE SURG SYN 7.5 PF PI (GLOVE) ×1 IMPLANT
GOWN STRL REUS W/ TWL LRG LVL3 (GOWN DISPOSABLE) ×1 IMPLANT
GOWN STRL REUS W/TWL LRG LVL3 (GOWN DISPOSABLE) ×3
KIT BASIN OR (CUSTOM PROCEDURE TRAY) ×3 IMPLANT
KIT TURNOVER KIT B (KITS) ×3 IMPLANT
LENS BIOM SUPER VIEW SET DISP (MISCELLANEOUS) ×3 IMPLANT
MICROPICK 25G (MISCELLANEOUS) ×3
NDL 18GX1X1/2 (RX/OR ONLY) (NEEDLE) ×1 IMPLANT
NDL 25GX 5/8IN NON SAFETY (NEEDLE) ×1 IMPLANT
NDL FILTER BLUNT 18X1 1/2 (NEEDLE) ×1 IMPLANT
NDL HYPO 25GX1X1/2 BEV (NEEDLE) IMPLANT
NDL HYPO 30X.5 LL (NEEDLE) ×2 IMPLANT
NDL RETROBULBAR 25GX1.5 (NEEDLE) ×1 IMPLANT
NEEDLE 18GX1X1/2 (RX/OR ONLY) (NEEDLE) ×3 IMPLANT
NEEDLE 25GX 5/8IN NON SAFETY (NEEDLE) ×3 IMPLANT
NEEDLE FILTER BLUNT 18X 1/2SAF (NEEDLE) ×2
NEEDLE FILTER BLUNT 18X1 1/2 (NEEDLE) ×1 IMPLANT
NEEDLE HYPO 25GX1X1/2 BEV (NEEDLE) IMPLANT
NEEDLE HYPO 30X.5 LL (NEEDLE) ×6 IMPLANT
NEEDLE RETROBULBAR 25GX1.5 (NEEDLE) ×3 IMPLANT
NS IRRIG 1000ML POUR BTL (IV SOLUTION) ×3 IMPLANT
PACK FRAGMATOME (OPHTHALMIC) IMPLANT
PACK VITRECTOMY CUSTOM (CUSTOM PROCEDURE TRAY) ×3 IMPLANT
PAD ARMBOARD 7.5X6 YLW CONV (MISCELLANEOUS) ×6 IMPLANT
PAK PIK VITRECTOMY CVS 25GA (OPHTHALMIC) ×3 IMPLANT
PICK MICROPICK 25G (MISCELLANEOUS) IMPLANT
PROBE ENDO DIATHERMY 25G (MISCELLANEOUS) IMPLANT
PROBE LASER ILLUM FLEX CVD 25G (OPHTHALMIC) ×2 IMPLANT
ROLLS DENTAL (MISCELLANEOUS) IMPLANT
SCRAPER DIAMOND 25GA (OPHTHALMIC RELATED) IMPLANT
SOL ANTI FOG 6CC (MISCELLANEOUS) ×1 IMPLANT
SOLUTION ANTI FOG 6CC (MISCELLANEOUS) ×2
STOPCOCK 4 WAY LG BORE MALE ST (IV SETS) IMPLANT
SUT VICRYL 7 0 TG140 8 (SUTURE) ×3 IMPLANT
SUT VICRYL 8 0 TG140 8 (SUTURE) ×2 IMPLANT
SYR 10ML LL (SYRINGE) IMPLANT
SYR 20ML LL LF (SYRINGE) ×3 IMPLANT
SYR 5ML LL (SYRINGE) ×3 IMPLANT
SYR TB 1ML LUER SLIP (SYRINGE) ×2 IMPLANT
WATER STERILE IRR 1000ML POUR (IV SOLUTION) ×3 IMPLANT
WIPE INSTRUMENT VISIWIPE 73X73 (MISCELLANEOUS) ×2 IMPLANT

## 2019-12-31 NOTE — Anesthesia Postprocedure Evaluation (Signed)
Anesthesia Post Note  Patient: [name] [name]  Procedure(s) Performed: REPAIR OF COMPLEX TRACTION RETINAL (Right Eye) PHOTOCOAGULATION WITH LASER (Right Eye) INSERTION OF GAS (Right Eye) PARS PLANA VITRECTOMY WITH 25 GAUGE (Right Eye) AIR/FLUID EXCHANGE (Right Eye)     Patient location during evaluation: PACU Anesthesia Type: MAC Level of consciousness: awake and alert Pain management: pain level controlled Vital Signs Assessment: post-procedure vital signs reviewed and stable Respiratory status: spontaneous breathing, nonlabored ventilation, respiratory function stable and patient connected to nasal cannula oxygen Cardiovascular status: stable and blood pressure returned to baseline Postop Assessment: no apparent nausea or vomiting Anesthetic complications: no   No complications documented.  Last Vitals:  Vitals:   12/31/19 1919 12/31/19 1930  BP: (!) 184/110 (!) 168/95  Pulse: 89 84  Resp: 18 13  Temp:    SpO2: 96% 98%    Last Pain:  Vitals:   12/31/19 1755  PainSc: Asleep                 Homero Hyson COKER

## 2019-12-31 NOTE — Anesthesia Postprocedure Evaluation (Signed)
Anesthesia Post Note  Patient: [name] [name]  Procedure(s) Performed: REPAIR OF COMPLEX TRACTION RETINAL (Right Eye) PHOTOCOAGULATION WITH LASER (Right Eye) INSERTION OF GAS (Right Eye) PARS PLANA VITRECTOMY WITH 25 GAUGE (Right Eye) AIR/FLUID EXCHANGE (Right Eye)     Patient location during evaluation: PACU Anesthesia Type: MAC Level of consciousness: awake and alert Pain management: pain level controlled Vital Signs Assessment: post-procedure vital signs reviewed and stable Respiratory status: spontaneous breathing, nonlabored ventilation and respiratory function stable Cardiovascular status: blood pressure returned to baseline and stable Postop Assessment: no apparent nausea or vomiting Anesthetic complications: no Comments: Hypertensive at baseline 098J SBP   No complications documented.  Last Vitals:  Vitals:   12/31/19 1352  BP: (!) 168/85  Pulse: 88  Resp: 18  Temp: 36.6 C  SpO2: 99%    Last Pain:  Vitals:   12/31/19 1400  PainSc: [place]

## 2019-12-31 NOTE — Transfer of Care (Signed)
Immediate Anesthesia Transfer of Care Note  Patient: [name] [name]  Procedure(s) Performed: REPAIR OF COMPLEX TRACTION RETINAL (Right Eye) PHOTOCOAGULATION WITH LASER (Right Eye) INSERTION OF GAS (Right Eye) PARS PLANA VITRECTOMY WITH 25 GAUGE (Right Eye) AIR/FLUID EXCHANGE (Right Eye)  Patient Location: PACU  Anesthesia Type:MAC  Level of Consciousness: awake, alert  and oriented  Airway & Oxygen Therapy: Patient Spontanous Breathing and Patient connected to nasal cannula oxygen  Post-op Assessment: Report given to RN and Post -op Vital signs reviewed and stable  Post vital signs: Reviewed and stable  Last Vitals:  Vitals Value Taken Time  BP    Temp    Pulse    Resp    SpO2      Last Pain:  Vitals:   12/31/19 1400  PainSc: 9       Patients Stated Pain Goal: 3 (28/00/34 9179)  Complications: No complications documented.

## 2019-12-31 NOTE — H&P (Signed)
Date of examination:  12/31/19  Indication for surgery: vision loss from vitreous hemorrhage and tractional retinal detachment right eye  Pertinent past medical history:  Past Medical History:  Diagnosis Date  . Abdominal muscle pain 09/08/2016  . Abnormal Pap smear of cervix 2009  . Abscess    history of multiple abscesses  . Acute bilateral low back pain 02/13/2017  . Acute blood loss anemia   . Acute on chronic renal failure ([place]) 07/12/2012  . Acute renal failure ([place]) 07/12/2012  . Adjustment disorder with depressed mood 03/26/2017  . AKI (acute kidney injury) ([place])   . Anemia of chronic disease 2002  . Anxiety    Panic attacks  . Bilateral lower extremity edema 05/13/2016  . Bipolar disorder ([place])   . Cataract    B/L cataract  . Cellulitis 05/21/2014   right eye  . Chronic bronchitis ([place])    "get it q yr" (05/13/2013)  . Chronic pain   . Dehiscence of amputation stump ([place])   . Depression   . Edema of lower extremity   . Endocarditis 2002   subacute bacterial endocarditis.   . Family history of anesthesia complication    "my mom has a hard time coming out from under"  . Fibromyalgia   . GERD (gastroesophageal reflux disease)    occ  . Heart murmur   . Herpes simplex type 1 infection 01/16/2018  . History of blood transfusion    "just low blood count" (05/13/2013)  . Hyperlipidemia   . Hypertension   . Hypothyroidism   . Hypothyroidism, adult 03/21/2014  . Hypoxia   . Leukocytosis   . Necrosis ([place])    and ulceration  . Necrotizing fasciitis s/p OR debridements 07/06/2012  . Obesity   . OSA on CPAP    does not wear all the time  . Peripheral neuropathy   . Pneumonia   . Type II diabetes mellitus (HCC)    Type  II  . Wound dehiscence 09/04/2019    Pertinent ocular history:  Proliferative diabetic retinopathy  Pertinent family history:  Family History  Problem Relation Age of Onset  . Heart failure Mother   . Diabetes Mother   . Kidney disease Mother    . Kidney disease Father   . Diabetes Father   . Diabetes Paternal Grandmother   . Heart failure Paternal Grandmother   . Other Other     General:  Healthy appearing patient in no distress.    Eyes:    Acuity OD HM    External: Within normal limits     Anterior segment: chamber deep  Fundus: No view - B-scan - tractional retinal detachment and vitreous hemorrhage right eye       Impression:  tractional retinal detachment and vitreous hemorrhage right eye      Plan: complex retinal detachment repair right eye  Jalene Mullet, MD

## 2019-12-31 NOTE — Brief Op Note (Signed)
12/31/2019  5:25 PM  PATIENT:  [name] [name]  44 y.o. female  PRE-OPERATIVE DIAGNOSIS:  TYPE 2 DIABETES WITH PROLISERATIV DIABETIC RETINOPATHY WITH TRACTION RETINAL DETACHMENT  POST-OPERATIVE DIAGNOSIS:  TYPE 2 DIABETES WITH PROLISERATIV DIABETIC RETINOPATHY WITH TRACTION RETINAL DETACHMENT  PROCEDURE:  Procedure(s): REPAIR OF COMPLEX TRACTION RETINAL (Right) PHOTOCOAGULATION WITH LASER (Right) INSERTION OF GAS (Right) PARS PLANA VITRECTOMY WITH 25 GAUGE (Right) AIR/FLUID EXCHANGE (Right)  SURGEON:  Surgeon(s) and Role:    * Jalene Mullet, MD - Primary  PHYSICIAN ASSISTANT:   ASSISTANTS: none   ANESTHESIA:   local and MAC  EBL:  0 mL   BLOOD ADMINISTERED:none  DRAINS: none   LOCAL MEDICATIONS USED:  MARCAINE    and LIDOCAINE   SPECIMEN:  No Specimen  DISPOSITION OF SPECIMEN:  N/A  COUNTS:  YES  TOURNIQUET:  * No tourniquets in log *  DICTATION: .Note written in EPIC  PLAN OF CARE: Discharge to home after PACU  PATIENT DISPOSITION:  PACU - hemodynamically stable.   Delay start of Pharmacological VTE agent (>24hrs) due to surgical blood loss or risk of bleeding: not applicable

## 2020-01-01 ENCOUNTER — Encounter (HOSPITAL_COMMUNITY): Payer: Self-pay | Admitting: Ophthalmology

## 2020-01-06 ENCOUNTER — Encounter (HOSPITAL_COMMUNITY): Payer: Self-pay | Admitting: Ophthalmology

## 2020-01-07 ENCOUNTER — Encounter: Payer: Self-pay | Admitting: Family

## 2020-01-07 ENCOUNTER — Other Ambulatory Visit: Payer: Self-pay | Admitting: Student

## 2020-01-07 ENCOUNTER — Encounter: Payer: Medicare HMO | Admitting: Student

## 2020-01-07 ENCOUNTER — Ambulatory Visit (INDEPENDENT_AMBULATORY_CARE_PROVIDER_SITE_OTHER): Payer: Medicare HMO | Admitting: Family

## 2020-01-07 ENCOUNTER — Other Ambulatory Visit: Payer: Self-pay

## 2020-01-07 ENCOUNTER — Telehealth: Payer: Self-pay | Admitting: Student

## 2020-01-07 VITALS — Ht 69.5 in | Wt 266.0 lb

## 2020-01-07 DIAGNOSIS — G546 Phantom limb syndrome with pain: Secondary | ICD-10-CM

## 2020-01-07 DIAGNOSIS — Z89512 Acquired absence of left leg below knee: Secondary | ICD-10-CM

## 2020-01-07 DIAGNOSIS — G8929 Other chronic pain: Secondary | ICD-10-CM

## 2020-01-07 DIAGNOSIS — Z89612 Acquired absence of left leg above knee: Secondary | ICD-10-CM

## 2020-01-07 DIAGNOSIS — M25561 Pain in right knee: Secondary | ICD-10-CM

## 2020-01-07 MED ORDER — METHYLPREDNISOLONE ACETATE 40 MG/ML IJ SUSP
40.0000 mg | INTRAMUSCULAR | Status: AC | PRN
  Administered 2020-01-07: 40 mg via INTRA_ARTICULAR

## 2020-01-07 MED ORDER — LIDOCAINE HCL 1 % IJ SOLN
5.0000 mL | INTRAMUSCULAR | Status: AC | PRN
  Administered 2020-01-07: 5 mL

## 2020-01-07 NOTE — Telephone Encounter (Addendum)
  [facility] Residency Telephone Encounter Continuity Care Appointment  HPI:   This telephone encounter was created for Ms. Stephannie C Vasco on 01/07/2020 for the following purpose/cc, to discuss pain medication.   Past Medical History:  Past Medical History:  Diagnosis Date  . Abdominal muscle pain 09/08/2016  . Abnormal Pap smear of cervix 2009  . Abscess    history of multiple abscesses  . Acute bilateral low back pain 02/13/2017  . Acute blood loss anemia   . Acute on chronic renal failure ([place]) 07/12/2012  . Acute renal failure ([place]) 07/12/2012  . Adjustment disorder with depressed mood 03/26/2017  . AKI (acute kidney injury) ([place])   . Anemia of chronic disease 2002  . Anxiety    Panic attacks  . Bilateral lower extremity edema 05/13/2016  . Bipolar disorder ([place])   . Cataract    B/L cataract  . Cellulitis 05/21/2014   right eye  . Chronic bronchitis ([place])    "get it q yr" (05/13/2013)  . Chronic pain   . Dehiscence of amputation stump ([place])   . Depression   . Edema of lower extremity   . Endocarditis 2002   subacute bacterial endocarditis.   . Family history of anesthesia complication    "my mom has a hard time coming out from under"  . Fibromyalgia   . GERD (gastroesophageal reflux disease)    occ  . Heart murmur   . Herpes simplex type 1 infection 01/16/2018  . History of blood transfusion    "just low blood count" (05/13/2013)  . Hyperlipidemia   . Hypertension   . Hypothyroidism   . Hypothyroidism, adult 03/21/2014  . Hypoxia   . Leukocytosis   . Necrosis ([place])    and ulceration  . Necrotizing fasciitis s/p OR debridements 07/06/2012  . Obesity   . OSA on CPAP    does not wear all the time  . Peripheral neuropathy   . Pneumonia   . Type II diabetes mellitus (HCC)    Type  II  . Wound dehiscence 09/04/2019      ROS:   As Per HPI   Assessment / Plan / Recommendations:   Patient was scheduled for an in person visit but  states she was double booked and changed her appointment with me to a telephone visit.  Reason for initial appointment was for patient to sign new pain contract as well as have UDS.  She is scheduled to have this done next week  As always, pt is advised that if symptoms worsen or new symptoms arise, they should go to an urgent care facility or to to ER for further evaluation.   Consent and Medical Decision Making:   Patient discussed with Dr. Philipp Ovens  This is a telephone encounter between [place] and Sanjuana Letters on 01/07/2020 for discussion of her medications. The visit was conducted with the patient located at home and Sanjuana Letters at [facility]. The patient's identity was confirmed using their DOB and current address. The patient has consented to being evaluated through a telephone encounter and understands the associated risks (an examination cannot be done and the patient may need to come in for an appointment) / benefits (allows the patient to remain at home, decreasing exposure to coronavirus). I personally spent 10 minutes on medical discussion.

## 2020-01-07 NOTE — Progress Notes (Signed)
Office Visit Note   Patient: [name] [name]           Date of Birth: 1975-07-22           MRN: [id number] Visit Date: 01/07/2020              Requested by: Riesa Pope, MD [address] [place],  [place] [address] PCP: Riesa Pope, MD  Chief Complaint  Patient presents with  . Left Leg - Routine Post Op    10/11/19 left AKA         HPI: The patient is a 44 year old woman who is status post left above-knee amputation in May of this year.  She reports she has not yet been set up with a prosthetic for her left lower extremity.  She has worked with [facility] in the past.  She has the desire and the willpower to do well in a prosthesis.  She is a K3 level ambulator and she will require and do well with a prosthesis that allows her to walk on uneven terrain a second and descend stairs and be active in her community.  She is also been having some chronic right knee pain this she reports is deep and described as beneath the kneecap.  She has some tenderness to the medial aspect of her knee as well this is made worse with scooting around in her wheelchair she uses the right lower extremity to propel herself.  She has had injections in the past which have provided her with adequate relief.  Assessment & Plan: Visit Diagnoses:  1. Hx of AKA (above knee amputation), left ([place])   2. Chronic pain of right knee     Plan: Depo-Medrol injection of the right knee today.  Patient tolerated this well.  Did provide a note to provide to her apartment complex as she does require handicap accessibility.    Follow-Up Instructions: Return if symptoms worsen or fail to improve.   Right Knee Exam   Tenderness  The patient is experiencing tenderness in the medial joint line.  Range of Motion  The patient has normal right knee ROM.  Tests  Varus: negative Valgus: negative  Other  Erythema: absent Swelling: mild Effusion: no effusion present      Patient is alert,  oriented, no adenopathy, well-dressed, normal affect, normal respiratory effort. The left residual limb is well consolidated well-healed there is no impending skin breakdown.  Imaging: No results found. No images are attached to the encounter.  Labs: Lab Results  Component Value Date   HGBA1C 8.5 (H) 09/04/2019   HGBA1C 7.0 (A) 05/30/2019   HGBA1C 6.6 (A) 02/28/2019   ESRSEDRATE 130 (H) 03/24/2017   ESRSEDRATE 53 (H) 09/14/2015   ESRSEDRATE 132 (H) 07/06/2012   CRP 13.6 (H) 02/14/2019   CRP 38.6 (H) 04/03/2018   CRP 5.4 (H) 09/14/2015   LABURIC 8.3 (H) 04/15/2019   REPTSTATUS 10/27/2019 FINAL 10/22/2019   GRAMSTAIN  09/04/2019    FEW WBC PRESENT, PREDOMINANTLY PMN MODERATE GRAM POSITIVE COCCI    CULT  10/22/2019    NO GROWTH 5 DAYS Performed at [facility], [place] [address] [place], [place] [address]    LABORGA STAPHYLOCOCCUS AUREUS 09/04/2019     Lab Results  Component Value Date   ALBUMIN 2.4 (L) 10/21/2019   ALBUMIN 2.3 (L) 09/18/2019   ALBUMIN 1.8 (L) 09/01/2019   LABURIC 8.3 (H) 04/15/2019    Lab Results  Component Value Date   MG 2.3  04/07/2018   MG 2.1 04/05/2018   MG 2.4 03/25/2017   No results found for: VD25OH  No results found for: PREALBUMIN CBC EXTENDED Latest Ref Rng & Units 12/31/2019 11/07/2019 10/31/2019  WBC 4.0 - 10.5 K/uL 6.0 5.4 5.6  RBC 3.87 - 5.11 MIL/uL 3.93 3.98 3.38(L)  HGB 12.0 - 15.0 g/dL 10.0(L) 10.1(L) 8.4(L)  HCT 36 - 46 % 32.3(L) 32.0(L) 27.5(L)  PLT 150 - 400 K/uL 222 291 173  NEUTROABS 1.7 - 7.7 K/uL - - -  LYMPHSABS 0.7 - 4.0 K/uL - - -     Body mass index is 38.72 kg/m.  Orders:  Orders Placed This Encounter  Procedures  . Large Joint Inj: R knee   No orders of the defined types were placed in this encounter.    Procedures: Large Joint Inj: R knee on 01/07/2020 11:32 AM Indications: pain Details: 18 G 1.5 in needle, anteromedial approach Medications: 5 mL lidocaine 1 %; 40 mg methylPREDNISolone acetate  40 MG/ML Consent was given by the patient.      Clinical Data: No additional findings.  ROS:  All other systems negative, except as noted in the HPI. Review of Systems  Constitutional: Negative for chills and fever.  Musculoskeletal: Positive for arthralgias, gait problem, joint swelling and myalgias.  Neurological: Negative for weakness and numbness.  Psychiatric/Behavioral: Agitation:     Objective: Vital Signs: Ht 5' 9.5" (1.765 m)   Wt 266 lb (120.7 kg)   LMP 12/22/2019   BMI 38.72 kg/m   Specialty Comments:  No specialty comments available.  PMFS History: Patient Active Problem List   Diagnosis Date Noted  . Hx of AKA (above knee amputation), left ([place])   . Symptomatic anemia 10/21/2019  . Anemia 10/21/2019  . Acute respiratory failure with hypoxia ([place]) 10/21/2019  . Blurry vision, right eye 10/08/2019  . Tobacco use 03/01/2019  . Phantom limb pain ([place]) 11/08/2018  . Current moderate episode of major depressive disorder ([place])   . Hyperkalemia   . Anemia of chronic disease   . Severe protein-calorie malnutrition ([place])   . Fall   . Status post below knee amputation, left ([place]) 04/11/2017  . Bipolar affective disorder ([place])   . Heart failure with preserved ejection fraction ([place]) 02/09/2017  . Routine adult health maintenance 12/08/2016  . Chronic pain of right knee 09/08/2016  . Chronic kidney disease (CKD), stage III (moderate) 07/15/2016  . OSA (obstructive sleep apnea) 05/13/2016  . Morbid obesity due to excess calories ([place]) 11/27/2015  . Hypothyroidism 04/25/2013  . Controlled type 2 diabetes mellitus ([place]) 07/12/2012  . Carpal tunnel syndrome, bilateral 01/25/2012  . Diabetic polyneuropathy associated with type 2 diabetes mellitus ([place]) 07/04/2011  . Anxiety and depression 06/02/2011  . GERD 12/06/2007  . Hyperlipidemia 08/29/2007  . Hypertension associated with diabetes ([place]) 08/29/2007   Past Medical History:  Diagnosis Date  . Abdominal  muscle pain 09/08/2016  . Abnormal Pap smear of cervix 2009  . Abscess    history of multiple abscesses  . Acute bilateral low back pain 02/13/2017  . Acute blood loss anemia   . Acute on chronic renal failure ([place]) 07/12/2012  . Acute renal failure ([place]) 07/12/2012  . Adjustment disorder with depressed mood 03/26/2017  . AKI (acute kidney injury) ([place])   . Anemia of chronic disease 2002  . Anxiety    Panic attacks  . Bilateral lower extremity edema 05/13/2016  . Bipolar disorder ([place])   . Cataract    B/L  cataract  . Cellulitis 05/21/2014   right eye  . Chronic bronchitis ([place])    "get it q yr" (05/13/2013)  . Chronic pain   . Dehiscence of amputation stump ([place])   . Depression   . Edema of lower extremity   . Endocarditis 2002   subacute bacterial endocarditis.   . Family history of anesthesia complication    "my mom has a hard time coming out from under"  . Fibromyalgia   . GERD (gastroesophageal reflux disease)    occ  . Heart murmur   . Herpes simplex type 1 infection 01/16/2018  . History of blood transfusion    "just low blood count" (05/13/2013)  . Hyperlipidemia   . Hypertension   . Hypothyroidism   . Hypothyroidism, adult 03/21/2014  . Hypoxia   . Leukocytosis   . Necrosis ([place])    and ulceration  . Necrotizing fasciitis s/p OR debridements 07/06/2012  . Obesity   . OSA on CPAP    does not wear all the time  . Peripheral neuropathy   . Pneumonia   . Type II diabetes mellitus (HCC)    Type  II  . Wound dehiscence 09/04/2019    Family History  Problem Relation Age of Onset  . Heart failure Mother   . Diabetes Mother   . Kidney disease Mother   . Kidney disease Father   . Diabetes Father   . Diabetes Paternal Grandmother   . Heart failure Paternal Grandmother   . Other Other     Past Surgical History:  Procedure Laterality Date  . ABDOMINAL AORTOGRAM W/LOWER EXTREMITY Left 03/19/2019   Procedure: ABDOMINAL AORTOGRAM W/LOWER EXTREMITY;  Surgeon:  Serafina Mitchell, MD;  Location: [facility];  Service: Cardiovascular;  Laterality: Left;  . ABOVE KNEE LEG AMPUTATION Left 10/11/2019   wound dehisence   . AIR/FLUID EXCHANGE Right 12/31/2019   Procedure: AIR/FLUID EXCHANGE;  Surgeon: Jalene Mullet, MD;  Location: [facility];  Service: Ophthalmology;  Laterality: Right;  . AMPUTATION Left 04/07/2017   Procedure: LEFT BELOW KNEE AMPUTATION;  Surgeon: Newt Minion, MD;  Location: [facility];  Service: Orthopedics;  Laterality: Left;  . AMPUTATION Left 10/11/2019   Procedure: LEFT ABOVE KNEE AMPUTATION;  Surgeon: Newt Minion, MD;  Location: [facility];  Service: Orthopedics;  Laterality: Left;  . EYE SURGERY     lazer  . GAS INSERTION Right 12/31/2019   Procedure: INSERTION OF GAS;  Surgeon: Jalene Mullet, MD;  Location: [facility];  Service: Ophthalmology;  Laterality: Right;  . INCISION AND DRAINAGE ABSCESS     multiple I&Ds  . INCISION AND DRAINAGE ABSCESS Left 07/09/2012   Procedure: DRESSING CHANGE, THIGH WOUND;  Surgeon: Harl Bowie, MD;  Location: [facility];  Service: General;  Laterality: Left;  . INCISION AND DRAINAGE OF WOUND Left 07/07/2012   Procedure: IRRIGATION AND DEBRIDEMENT WOUND;  Surgeon: Harl Bowie, MD;  Location: [facility];  Service: General;  Laterality: Left;  . INCISION AND DRAINAGE PERIRECTAL ABSCESS Left 07/14/2012   Procedure: DEBRIDEMENT OF SKIN & SOFT TISSUE; DRESSING CHANGE UNDER ANESTHESIA;  Surgeon: Gayland Curry, MD,FACS;  Location: [facility];  Service: General;  Laterality: Left;  . INCISION AND DRAINAGE PERIRECTAL ABSCESS Left 07/16/2012   Procedure: I&D Left Thigh;  Surgeon: Gwenyth Ober, MD;  Location: [facility];  Service: General;  Laterality: Left;  . INCISION AND DRAINAGE PERIRECTAL ABSCESS N/A 01/05/2015   Procedure: IRRIGATION AND DEBRIDEMENT PERIRECTAL ABSCESS;  Surgeon: Rodman Key  Georgette Dover, MD;  Location: [facility];  Service: General;  Laterality: N/A;  . IRRIGATION AND DEBRIDEMENT ABSCESS Left 07/06/2012   Procedure:  IRRIGATION AND DEBRIDEMENT ABSCESS BUTTOCKS AND THIGH;  Surgeon: Shann Medal, MD;  Location: [facility];  Service: General;  Laterality: Left;  . IRRIGATION AND DEBRIDEMENT ABSCESS Left 08/10/2012   Procedure: IRRIGATION AND DEBRIDEMENT ABSCESS;  Surgeon: Madilyn Hook, DO;  Location: [facility];  Service: General;  Laterality: Left;  . PARS PLANA VITRECTOMY Right 12/31/2019   Procedure: PARS PLANA VITRECTOMY WITH 25 GAUGE;  Surgeon: Jalene Mullet, MD;  Location: [facility];  Service: Ophthalmology;  Laterality: Right;  . PHOTOCOAGULATION WITH LASER Right 12/31/2019   Procedure: PHOTOCOAGULATION WITH LASER;  Surgeon: Jalene Mullet, MD;  Location: [facility];  Service: Ophthalmology;  Laterality: Right;  . REPAIR OF COMPLEX TRACTION RETINAL DETACHMENT Right 12/31/2019   Procedure: REPAIR OF COMPLEX TRACTION RETINAL;  Surgeon: Jalene Mullet, MD;  Location: [facility];  Service: Ophthalmology;  Laterality: Right;  . STUMP REVISION Left 06/09/2017   Procedure: REVISION LEFT BELOW KNEE AMPUTATION;  Surgeon: Newt Minion, MD;  Location: [facility];  Service: Orthopedics;  Laterality: Left;  . STUMP REVISION Left 09/04/2019   Procedure: LEFT BELOW KNEE AMPUTATION REVISION;  Surgeon: Newt Minion, MD;  Location: [facility];  Service: Orthopedics;  Laterality: Left;  . STUMP REVISION Left 09/20/2019   Procedure: LEFT BELOW KNEE AMPUTATION REVISION;  Surgeon: Newt Minion, MD;  Location: [facility];  Service: Orthopedics;  Laterality: Left;  . TONSILLECTOMY  1994   Social History   Occupational History  . Occupation: disability  Tobacco Use  . Smoking status: Former Smoker    Packs/day: 0.50    Years: 16.00    Pack years: 8.00    Types: Cigarettes    Quit date: 07/22/2019    Years since quitting: 0.4  . Smokeless tobacco: Never Used  Vaping Use  . Vaping Use: Former  . Devices: CBD oil  Substance and Sexual Activity  . Alcohol use: Yes    Alcohol/week: 0.0 standard drinks    Comment: social  . Drug use: No  . Sexual  activity: Yes

## 2020-01-08 ENCOUNTER — Other Ambulatory Visit: Payer: Self-pay | Admitting: Internal Medicine

## 2020-01-08 DIAGNOSIS — H4311 Vitreous hemorrhage, right eye: Secondary | ICD-10-CM | POA: Diagnosis not present

## 2020-01-08 DIAGNOSIS — E113531 Type 2 diabetes mellitus with proliferative diabetic retinopathy with traction retinal detachment not involving the macula, right eye: Secondary | ICD-10-CM | POA: Diagnosis not present

## 2020-01-08 NOTE — Op Note (Signed)
[name] [name] [birth year]  Diagnosis: Proliferative diabetic retinopathy with tractional retinal detachment involving the macula right eye   Procedure: [place] Vitrectomy, Membrane Peeling, Endolaser, Fluid Gas Exchange and membranectomy Operative Eye:  right eye  Surgeon: Royston Cowper Estimated Blood Loss: minimal Specimens for Pathology:  None Complications: none   After informed consent was obtained, the patient was brought to the operating room and a time-out confirmed the correct operative eye as the left eye. Retrobulbar anesthesia was obtained in the left eye without complication  The  patient was prepped and draped in the usual fashion for ocular surgery on the  right eye .  A lid speculum was placed.  Infusion line and trocar was placed at the 8 o'clock position approximately 3.5 mm from the surgical limbus.   The infusion line was allowed to run and then clamped when placed at the cannula opening. The line was inserted and secured to the drape with an adhesive strip.   Active trocars/cannula were placed at the 10 and 2 o'clock positions approximately 3.5 mm from the surgical limbus. The cannula was visualized in the vitreous cavity.  The light pipe and vitreous cutter were inserted into the vitreous cavity and a core vitrectomy was performed.  Care taken to limit any traction on the macula and area of tractional detachment.   Attention was directed toward relieving the tractional detachment from the posterior pole in particular peripherally surrounding the arcades (temporally and inferiorly). This was done carefully at the disc and surrounding arcades. There was notable neovascular fronds with traction and associated hemorrhage inferiorly. Care was taken to elevate the membranes and remove them both with a vitrector and a lighted pick.  Hemostasis of the neovascular fronds was performed with endocautery and endolaser. Following hemostasis, continued dissection of membranes  and removal of membranes was performed including the temporal retina. Endolaser was applied to the areas where the neovascular fronds were still present.  3 rows of endolaser were applied 360 degrees to the periphery.  A complete air-fluid exchange was then performed and additional endolaser was applied. 14% SF6 gas was injected into the eye. The trocars were sequentially removed and all were noted to be airtight. Subconjunctival injections of Ancef and Decadron were placed.   The speculum and drapes were removed and the eye was patched with Polymixin/Bacitracin ophthalmic ointment. An eye shield was placed and the patient was transferred alert and conversant with stable vital signs to the post operative recovery area.  The patient tolerated the procedure well and no complications were noted.

## 2020-01-14 ENCOUNTER — Encounter: Payer: Medicare HMO | Admitting: Internal Medicine

## 2020-01-14 ENCOUNTER — Other Ambulatory Visit: Payer: Self-pay | Admitting: Internal Medicine

## 2020-01-16 ENCOUNTER — Ambulatory Visit (INDEPENDENT_AMBULATORY_CARE_PROVIDER_SITE_OTHER): Payer: Medicare HMO | Admitting: Internal Medicine

## 2020-01-16 ENCOUNTER — Other Ambulatory Visit: Payer: Self-pay

## 2020-01-16 ENCOUNTER — Encounter: Payer: Self-pay | Admitting: Internal Medicine

## 2020-01-16 VITALS — BP 164/92 | HR 93 | Temp 98.2°F | Ht 69.5 in | Wt 286.1 lb

## 2020-01-16 DIAGNOSIS — Z89612 Acquired absence of left leg above knee: Secondary | ICD-10-CM | POA: Diagnosis not present

## 2020-01-16 DIAGNOSIS — G894 Chronic pain syndrome: Secondary | ICD-10-CM | POA: Diagnosis not present

## 2020-01-16 NOTE — Patient Instructions (Signed)
It was nice seeing you today! Thank you for choosing [facility] for your Primary Care.    Today we talked about:   1. Pain Contract: Today we signed our pain contract for Percocet, 45 tablets per month, to be taken once or twice per day  2. Lets follow up in 4 weeks to talk more about blood pressure and diabetes

## 2020-01-16 NOTE — Progress Notes (Signed)
CC: chronic pain  HPI:  [name] is a 44 y.o. with a PMHx as listed below who presents to the clinic for chronic pain.   Please see the Encounters tab for problem-based Assessment & Plan regarding status of patient's acute and chronic conditions.  Past Medical History:  Diagnosis Date  . Abdominal muscle pain 09/08/2016  . Abnormal Pap smear of cervix 2009  . Abscess    history of multiple abscesses  . Acute bilateral low back pain 02/13/2017  . Acute blood loss anemia   . Acute on chronic renal failure ([place]) 07/12/2012  . Acute renal failure ([place]) 07/12/2012  . Adjustment disorder with depressed mood 03/26/2017  . AKI (acute kidney injury) ([place])   . Anemia of chronic disease 2002  . Anxiety    Panic attacks  . Bilateral lower extremity edema 05/13/2016  . Bipolar disorder ([employer])   . Cataract    B/L cataract  . Cellulitis 05/21/2014   right eye  . Chronic bronchitis ([place])    "get it q yr" (05/13/2013)  . Chronic pain   . Dehiscence of amputation stump ([place])   . Depression   . Edema of lower extremity   . Endocarditis 2002   subacute bacterial endocarditis.   . Family history of anesthesia complication    "my mom has a hard time coming out from under"  . Fibromyalgia   . GERD (gastroesophageal reflux disease)    occ  . Heart murmur   . Herpes simplex type 1 infection 01/16/2018  . History of blood transfusion    "just low blood count" (05/13/2013)  . Hyperlipidemia   . Hypertension   . Hypothyroidism   . Hypothyroidism, adult 03/21/2014  . Hypoxia   . Leukocytosis   . Necrosis ([place])    and ulceration  . Necrotizing fasciitis s/p OR debridements 07/06/2012  . Obesity   . OSA on CPAP    does not wear all the time  . Peripheral neuropathy   . Pneumonia   . Type II diabetes mellitus (HCC)    Type  II  . Wound dehiscence 09/04/2019   Review of Systems: Review of Systems  Constitutional: Negative for chills and fever.  Respiratory: Negative for  shortness of breath.   Cardiovascular: Negative for chest pain.  Gastrointestinal: Negative for abdominal pain, diarrhea, nausea and vomiting.  Musculoskeletal: Positive for back pain, falls, joint pain and myalgias.  Neurological: Negative for dizziness and headaches.   Physical Exam:  Vitals:   01/16/20 1622  BP: (!) 164/92  Pulse: 93  Temp: 98.2 F (36.8 C)  TempSrc: Oral  SpO2: 100%  Weight: 286 lb 1.6 oz (129.8 kg)  Height: 5' 9.5" (1.765 m)    Physical Exam Constitutional:      General: She is not in acute distress.    Appearance: She is obese.  Pulmonary:     Effort: Pulmonary effort is normal. No respiratory distress.  Musculoskeletal:     Right lower leg: No edema.     Left lower leg: No edema.  Skin:    General: Skin is warm and dry.  Neurological:     Mental Status: She is alert and oriented to person, place, and time. Mental status is at baseline.     Cranial Nerves: No cranial nerve deficit.  Psychiatric:        Mood and Affect: Mood normal.        Behavior: Behavior normal.  Thought Content: Thought content normal.        Judgment: Judgment normal.    Assessment & Plan:   See Encounters Tab for problem based charting.  Patient discussed with Dr. Evette Doffing

## 2020-01-19 NOTE — Assessment & Plan Note (Signed)
[name] [name] is here today to sign a controlled substance contract with our clinic today.  She has been to receiving Percocet 5-325 mg, with instructions to take 1 to 2 tablets daily as needed for severe pain.  She states that her pain location is in her back, bilateral knees, and some pain associated with her left AKA.  The Percocet allows her to function including playing with her daughter, taking care of the household including cleaning, cooking in addition to her ADLs such as bathing herself.  She denies any excessive drowsiness with this current dosage.  She recalls in the past she has run out early before but has not done so recently.  She never needs more than 3 tablets/day.   Assessment/plan: Patient has been receiving Percocet from our office for at least a year with attempts in the past to transfer her opioid therapy to a pain clinic without success given difficulty with contacting patient, or secondary to pandemic.  At this time, patient has been stable on her current dosage and there has been no red flags.  I reviewed the contract in detail with her today including obtaining from a singular pharmacy, not obtaining opioids from any other provider, following up regularly, willingness to have random drug screens.  [name] [name] expressed understanding and is wanting to move forward with signing the contract.  -Controlled substance contract signed on January 16, 2020 for Percocet 5-325 mg, #45 tablets per 30 days -Tox assure pending

## 2020-01-20 ENCOUNTER — Telehealth: Payer: Self-pay | Admitting: Student

## 2020-01-20 LAB — TOXASSURE SELECT,+ANTIDEPR,UR

## 2020-01-20 NOTE — Progress Notes (Signed)
Internal Medicine Clinic Attending  Case discussed with Dr. Basaraba  At the time of the visit.  We reviewed the resident's history and exam and pertinent patient test results.  I agree with the assessment, diagnosis, and plan of care documented in the resident's note.  

## 2020-01-21 NOTE — Telephone Encounter (Signed)
Attempted to call x2, unable to leave voicemail. Wanted to discuss results of patient's recent UDS.

## 2020-01-22 DIAGNOSIS — E113593 Type 2 diabetes mellitus with proliferative diabetic retinopathy without macular edema, bilateral: Secondary | ICD-10-CM | POA: Diagnosis not present

## 2020-01-22 DIAGNOSIS — H2513 Age-related nuclear cataract, bilateral: Secondary | ICD-10-CM | POA: Diagnosis not present

## 2020-01-22 DIAGNOSIS — H25013 Cortical age-related cataract, bilateral: Secondary | ICD-10-CM | POA: Diagnosis not present

## 2020-01-22 DIAGNOSIS — H25042 Posterior subcapsular polar age-related cataract, left eye: Secondary | ICD-10-CM | POA: Diagnosis not present

## 2020-01-22 LAB — HM DIABETES EYE EXAM

## 2020-01-27 DIAGNOSIS — Z89512 Acquired absence of left leg below knee: Secondary | ICD-10-CM | POA: Diagnosis not present

## 2020-01-29 ENCOUNTER — Other Ambulatory Visit: Payer: Self-pay

## 2020-01-29 DIAGNOSIS — Z89612 Acquired absence of left leg above knee: Secondary | ICD-10-CM

## 2020-01-30 DIAGNOSIS — E113511 Type 2 diabetes mellitus with proliferative diabetic retinopathy with macular edema, right eye: Secondary | ICD-10-CM | POA: Diagnosis not present

## 2020-01-30 DIAGNOSIS — E113392 Type 2 diabetes mellitus with moderate nonproliferative diabetic retinopathy without macular edema, left eye: Secondary | ICD-10-CM | POA: Diagnosis not present

## 2020-01-30 DIAGNOSIS — H3582 Retinal ischemia: Secondary | ICD-10-CM | POA: Diagnosis not present

## 2020-01-30 DIAGNOSIS — H4311 Vitreous hemorrhage, right eye: Secondary | ICD-10-CM | POA: Diagnosis not present

## 2020-01-30 DIAGNOSIS — H2521 Age-related cataract, morgagnian type, right eye: Secondary | ICD-10-CM | POA: Diagnosis not present

## 2020-01-31 DIAGNOSIS — Z89612 Acquired absence of left leg above knee: Secondary | ICD-10-CM | POA: Diagnosis not present

## 2020-02-05 ENCOUNTER — Encounter: Payer: Self-pay | Admitting: *Deleted

## 2020-02-10 ENCOUNTER — Encounter (HOSPITAL_COMMUNITY): Payer: Self-pay | Admitting: Ophthalmology

## 2020-02-10 NOTE — Progress Notes (Signed)
Same day workup call  Medications: On day of surgery may take: Pregabalin, oxycodone (if needed), metoprolol, hydralazine, prozac, flexeril, lipitor, norvasc  On day of surgery do not take Victoza  Advised to hold Ibuprofen   PCP - Was Dr. Doreene Adas with internal medicine here at [place] - No  Chest x-ray - 10/21/19 EKG - 10/22/19 Stress Test - No ECHO - 10/22/19 Cardiac Cath - No  Sleep Study - Yes diagnosed with OSA 10/07/19 CPAP - No using  Fasting Blood Sugar - Not checking sugars  COVID TEST-  Day of surgery uses Medicaid transportation    Anesthesia review: Yes cardiac history, A1C 8.5 on 09/04/19  Patient denies shortness of breath, fever, cough and chest pain at PAT appointment   All instructions explained to the patient, with a verbal understanding of the material. Patient agrees to go over the instructions while at home for a better understanding. Patient also instructed to self quarantine after being tested for COVID-19. The opportunity to ask questions was provided.

## 2020-02-10 NOTE — Progress Notes (Signed)
No orders present for patient's surgery on 21st.  Staff message sent requesting orders from Dr. Posey Pronto

## 2020-02-10 NOTE — Progress Notes (Signed)
Anesthesia Chart Review: [name] [name]   Case: [id number] Date/Time: 02/11/20 1445   Procedure: CATARACT EXTRACTION PHACO AND INTRAOCULAR LENS PLACEMENT (IOC) (Right )   Anesthesia type: Monitor Anesthesia Care   Pre-op diagnosis: AGE RELATED CATARACT RIGHT EYE   Location: [place] OR ROOM 08 / [facility]   Surgeons: Jalene Mullet, MD      DISCUSSION: Patient is a 44 year old female scheduled for the above procedure. She is s/p pars plana vitrectomy, membrane peeling, endolaser, fluid gas exchange and membranectomy for proliferative diabetic retinopathy and tractional retinal detachment involving the macula of the right eye on 12/31/2019.   Other history includes former smoker (quit 07/22/19), CKD3b, GERD, HTN, HFpEF, endocarditis (2002), murmur, hypothyroidism, OSA (not using CPAP), DM2, anxiety with panic attacks, left AKA (left BKA 09/04/19-->AKA 10/11/19). - Admission [facility] 10/21/2019-10/31/19 with symptomatic anemia, HCAP, acute on chronic diastolic heart failure, acute on chronic CKD (Cr 2.69).  Presented to [facility] w/ hemoglobin of 5.7. Initially thoughtto be post-operative anemia, she was also found to have iron sat of 4% w/ ferritin of 64 and increased reticulocyte count. She was given 2 units of blood and 2x weeklyiron infusion. Over the course of hospitalization, she was noted to have recurrent drop in hemoglobin. CT pelvis and thigh was performed which ruled out any retroperitoneal bleed or expanding hematoma from surgical site. She was noted to be on menstrual cycle and was given 2 additional units of blood as well as erythropoietin during her hospitalization. She was discharged once her hemoglobin stabilized around8.  She was also treated for HCAP, her oxygenation improved and her fever resolved.  She had significant edema with elevated BNP on admission. Echocardiogram was performed showing intact systolic function with EF of 60-65%. She was treated with IV furosemide intermittently to titrate for euvolemia  in setting of repeat blood administrations.  As of 12/31/19, Cr stable at 2.04 and H/H 10.0/32.3. A1c 8.5 on 09/04/19.   She will need preoperative COVID-19 testing on the day of surgery.  Will need day of surgery labs and anesthesia team evaluation.    VS: LMP 02/08/2020   BP Readings from Last 3 Encounters:  01/16/20 (!) 164/92  12/31/19 (!) 158/94  11/07/19 (!) 178/93   Pulse Readings from Last 3 Encounters:  01/16/20 93  12/31/19 84  11/07/19 84    PROVIDERS: Riesa Pope, MD is PCP [facility])   LABS: For day of procedure.   EKG 10/21/2019: NSR.  Rate 93.  Low voltage QRS.   CV: TTE 10/22/2019: 1. Left ventricular ejection fraction, by estimation, is 60 to 65%. The  left ventricle has normal function. The left ventricle has no regional  wall motion abnormalities. Left ventricular diastolic parameters are  indeterminate.  2. Right ventricular systolic function is normal. The right ventricular  size is not well visualized.  3. Left atrial size was mildly dilated.  4. Right atrial size was mildly dilated.  5. The mitral valve is normal in structure. Trivial mitral valve  regurgitation. No evidence of mitral stenosis.  6. The aortic valve has an indeterminant number of cusps. Aortic valve  regurgitation is not visualized. No aortic stenosis is present.  7. The inferior vena cava is dilated in size with <50% respiratory  variability, suggesting right atrial pressure of 15 mmHg.   Conclusion(s)/Recommendation(s): Normal biventricular function without  evidence of hemodynamically significant valvular heart disease.    Past Medical History:  Diagnosis Date  . Abdominal muscle pain 09/08/2016  .  Abnormal Pap smear of cervix 2009  . Abscess    history of multiple abscesses  . Acute bilateral low back pain 02/13/2017  . Acute blood loss anemia   . Acute on chronic renal failure ([place]) 07/12/2012  . Acute renal  failure ([place]) 07/12/2012  . Adjustment disorder with depressed mood 03/26/2017  . AKI (acute kidney injury) ([place])   . Anemia of chronic disease 2002  . Anxiety    Panic attacks  . Bilateral lower extremity edema 05/13/2016  . Bipolar disorder ([place])   . Cataract    B/L cataract  . Cellulitis 05/21/2014   right eye  . Chronic bronchitis ([place])    "get it q yr" (05/13/2013)  . Chronic pain   . Dehiscence of amputation stump ([place])   . Depression   . Edema of lower extremity   . Endocarditis 2002   subacute bacterial endocarditis.   . Family history of anesthesia complication    "my mom has a hard time coming out from under"  . Fibromyalgia   . GERD (gastroesophageal reflux disease)    occ  . Heart murmur   . Herpes simplex type 1 infection 01/16/2018  . History of blood transfusion    "just low blood count" (05/13/2013)  . Hyperlipidemia   . Hypertension   . Hypothyroidism   . Hypothyroidism, adult 03/21/2014  . Hypoxia   . Leukocytosis   . Necrosis ([place])    and ulceration  . Necrotizing fasciitis s/p OR debridements 07/06/2012  . Obesity   . OSA on CPAP    does not wear all the time  . Peripheral neuropathy   . Pneumonia   . Type II diabetes mellitus (HCC)    Type  II  . Wound dehiscence 09/04/2019    Past Surgical History:  Procedure Laterality Date  . ABDOMINAL AORTOGRAM W/LOWER EXTREMITY Left 03/19/2019   Procedure: ABDOMINAL AORTOGRAM W/LOWER EXTREMITY;  Surgeon: Serafina Mitchell, MD;  Location: [place] [facility];  Service: Cardiovascular;  Laterality: Left;  . ABOVE KNEE LEG AMPUTATION Left 10/11/2019   wound dehisence   . AIR/FLUID EXCHANGE Right 12/31/2019   Procedure: AIR/FLUID EXCHANGE;  Surgeon: Jalene Mullet, MD;  Location: [place];  Service: Ophthalmology;  Laterality: Right;  . AMPUTATION Left 04/07/2017   Procedure: LEFT BELOW KNEE AMPUTATION;  Surgeon: Newt Minion, MD;  Location: [place];  Service: Orthopedics;  Laterality: Left;  . AMPUTATION Left  10/11/2019   Procedure: LEFT ABOVE KNEE AMPUTATION;  Surgeon: Newt Minion, MD;  Location: [place];  Service: Orthopedics;  Laterality: Left;  . CORONARY ARTERY BYPASS GRAFT    . EYE SURGERY     lazer  . GAS INSERTION Right 12/31/2019   Procedure: INSERTION OF GAS;  Surgeon: Jalene Mullet, MD;  Location: [place];  Service: Ophthalmology;  Laterality: Right;  . INCISION AND DRAINAGE ABSCESS     multiple I&Ds  . INCISION AND DRAINAGE ABSCESS Left 07/09/2012   Procedure: DRESSING CHANGE, THIGH WOUND;  Surgeon: Harl Bowie, MD;  Location: [place];  Service: General;  Laterality: Left;  . INCISION AND DRAINAGE OF WOUND Left 07/07/2012   Procedure: IRRIGATION AND DEBRIDEMENT WOUND;  Surgeon: Harl Bowie, MD;  Location: [place];  Service: General;  Laterality: Left;  . INCISION AND DRAINAGE PERIRECTAL ABSCESS Left 07/14/2012   Procedure: DEBRIDEMENT OF SKIN & SOFT TISSUE; DRESSING CHANGE UNDER ANESTHESIA;  Surgeon: Gayland Curry, MD,FACS;  Location: [place];  Service: General;  Laterality: Left;  .  INCISION AND DRAINAGE PERIRECTAL ABSCESS Left 07/16/2012   Procedure: I&D Left Thigh;  Surgeon: Gwenyth Ober, MD;  Location: [place];  Service: General;  Laterality: Left;  . INCISION AND DRAINAGE PERIRECTAL ABSCESS N/A 01/05/2015   Procedure: IRRIGATION AND DEBRIDEMENT PERIRECTAL ABSCESS;  Surgeon: Donnie Mesa, MD;  Location: [place];  Service: General;  Laterality: N/A;  . IRRIGATION AND DEBRIDEMENT ABSCESS Left 07/06/2012   Procedure: IRRIGATION AND DEBRIDEMENT ABSCESS BUTTOCKS AND THIGH;  Surgeon: Shann Medal, MD;  Location: [place];  Service: General;  Laterality: Left;  . IRRIGATION AND DEBRIDEMENT ABSCESS Left 08/10/2012   Procedure: IRRIGATION AND DEBRIDEMENT ABSCESS;  Surgeon: Madilyn Hook, DO;  Location: [place];  Service: General;  Laterality: Left;  . PARS PLANA VITRECTOMY Right 12/31/2019   Procedure: PARS PLANA VITRECTOMY WITH 25 GAUGE;  Surgeon: Jalene Mullet, MD;  Location: [place];  Service:  Ophthalmology;  Laterality: Right;  . PHOTOCOAGULATION WITH LASER Right 12/31/2019   Procedure: PHOTOCOAGULATION WITH LASER;  Surgeon: Jalene Mullet, MD;  Location: [place];  Service: Ophthalmology;  Laterality: Right;  . REPAIR OF COMPLEX TRACTION RETINAL DETACHMENT Right 12/31/2019   Procedure: REPAIR OF COMPLEX TRACTION RETINAL;  Surgeon: Jalene Mullet, MD;  Location: [place];  Service: Ophthalmology;  Laterality: Right;  . STUMP REVISION Left 06/09/2017   Procedure: REVISION LEFT BELOW KNEE AMPUTATION;  Surgeon: Newt Minion, MD;  Location: [place];  Service: Orthopedics;  Laterality: Left;  . STUMP REVISION Left 09/04/2019   Procedure: LEFT BELOW KNEE AMPUTATION REVISION;  Surgeon: Newt Minion, MD;  Location: [place];  Service: Orthopedics;  Laterality: Left;  . STUMP REVISION Left 09/20/2019   Procedure: LEFT BELOW KNEE AMPUTATION REVISION;  Surgeon: Newt Minion, MD;  Location: [place];  Service: Orthopedics;  Laterality: Left;  . TONSILLECTOMY  1994    MEDICATIONS: No current facility-administered medications for this encounter.   Marland Kitchen amitriptyline (ELAVIL) 50 MG tablet  . amLODipine (NORVASC) 10 MG tablet  . atorvastatin (LIPITOR) 20 MG tablet  . bumetanide (BUMEX) 2 MG tablet  . cyclobenzaprine (FLEXERIL) 5 MG tablet  . diclofenac Sodium (VOLTAREN) 1 % GEL  . FLUoxetine (PROZAC) 20 MG/5ML solution  . hydrALAZINE (APRESOLINE) 25 MG tablet  . IBU 800 MG tablet  . lisinopril (ZESTRIL) 10 MG tablet  . metoprolol succinate (TOPROL-XL) 25 MG 24 hr tablet  . mupirocin ointment (BACTROBAN) 2 %  . oxyCODONE-acetaminophen (PERCOCET/ROXICET) 5-325 MG tablet  . pregabalin (LYRICA) 50 MG capsule  . VICTOZA 18 MG/3ML SOPN  . allopurinol (ZYLOPRIM) 100 MG tablet  . blood glucose meter kit and supplies KIT  . hydrOXYzine (ATARAX/VISTARIL) 25 MG tablet  . sodium bicarbonate 650 MG tablet  . sodium zirconium cyclosilicate (LOKELMA) 10 g PACK packet  . TRUEPLUS PEN NEEDLES 31G X 5 MM MISC     Myra Gianotti, PA-C Surgical Short Stay/Anesthesiology [facility] Phone [phone] [facility] Phone [phone] 02/10/2020 2:15 PM

## 2020-02-10 NOTE — Anesthesia Preprocedure Evaluation (Addendum)
Anesthesia Evaluation  Patient identified by MRN, date of birth, ID band Patient awake    Reviewed: Patient's Chart, lab work & pertinent test results  Airway Mallampati: II  TM Distance: >3 FB Neck ROM: Full    Dental  (+) Teeth Intact   Pulmonary sleep apnea , Patient abstained from smoking., former smoker,    Pulmonary exam normal        Cardiovascular hypertension, Pt. on medications  Rhythm:Regular Rate:Normal     Neuro/Psych Anxiety Depression Bipolar Disorder    GI/Hepatic Neg liver ROS, GERD  Medicated,  Endo/Other  diabetes, Type 2Hypothyroidism   Renal/GU CRFRenal disease     Musculoskeletal  (+) Fibromyalgia -  Abdominal (+) + obese,  Abdomen: soft. Bowel sounds: normal.  Peds  Hematology  (+) anemia ,   Anesthesia Other Findings   Reproductive/Obstetrics                            Anesthesia Physical Anesthesia Plan  ASA: III  Anesthesia Plan: MAC   Post-op Pain Management:    Induction:   PONV Risk Score and Plan: 2 and Ondansetron and Dexamethasone  Airway Management Planned: Simple Face Mask and Nasal Cannula  Additional Equipment: None  Intra-op Plan:   Post-operative Plan:   Informed Consent:     Dental advisory given  Plan Discussed with:   Anesthesia Plan Comments: (PAT note written 02/10/2020 by Myra Gianotti, PA-C.  Per PAT note: "history includes former smoker (quit 07/22/19), CKD3b, GERD, HTN,HFpEF,endocarditis (2002), murmur, hypothyroidism, OSA (not using CPAP), DM2, anxiety with panic attacks, left AKA (left BKA 09/04/19-->AKA 10/11/19). Admission [facility] 10/21/2019-10/31/19 with symptomatic anemia, HCAP, acute on chronic diastolic heart failure, acute on chronic CKD")       Anesthesia Quick Evaluation

## 2020-02-11 ENCOUNTER — Encounter (HOSPITAL_COMMUNITY)
Admission: RE | Disposition: A | Discharge status: Home / Self Care | Payer: Self-pay | Source: Home / Self Care | Attending: Ophthalmology

## 2020-02-11 ENCOUNTER — Other Ambulatory Visit: Payer: Self-pay

## 2020-02-11 ENCOUNTER — Ambulatory Visit (HOSPITAL_COMMUNITY): Payer: Medicare HMO | Admitting: Vascular Surgery

## 2020-02-11 ENCOUNTER — Ambulatory Visit (HOSPITAL_COMMUNITY)
Admission: RE | Admit: 2020-02-11 | Discharge: 2020-02-11 | Disposition: A | Discharge status: Home / Self Care | Payer: Medicare HMO | Attending: Ophthalmology | Admitting: Ophthalmology

## 2020-02-11 ENCOUNTER — Encounter (HOSPITAL_COMMUNITY): Payer: Self-pay | Admitting: Ophthalmology

## 2020-02-11 DIAGNOSIS — Z951 Presence of aortocoronary bypass graft: Secondary | ICD-10-CM | POA: Insufficient documentation

## 2020-02-11 DIAGNOSIS — Z89612 Acquired absence of left leg above knee: Secondary | ICD-10-CM | POA: Insufficient documentation

## 2020-02-11 DIAGNOSIS — E1136 Type 2 diabetes mellitus with diabetic cataract: Secondary | ICD-10-CM | POA: Insufficient documentation

## 2020-02-11 DIAGNOSIS — E669 Obesity, unspecified: Secondary | ICD-10-CM | POA: Insufficient documentation

## 2020-02-11 DIAGNOSIS — H2521 Age-related cataract, morgagnian type, right eye: Secondary | ICD-10-CM | POA: Diagnosis not present

## 2020-02-11 DIAGNOSIS — G8929 Other chronic pain: Secondary | ICD-10-CM | POA: Diagnosis not present

## 2020-02-11 DIAGNOSIS — Z7984 Long term (current) use of oral hypoglycemic drugs: Secondary | ICD-10-CM | POA: Diagnosis not present

## 2020-02-11 DIAGNOSIS — N189 Chronic kidney disease, unspecified: Secondary | ICD-10-CM | POA: Insufficient documentation

## 2020-02-11 DIAGNOSIS — F329 Major depressive disorder, single episode, unspecified: Secondary | ICD-10-CM | POA: Diagnosis not present

## 2020-02-11 DIAGNOSIS — Z79899 Other long term (current) drug therapy: Secondary | ICD-10-CM | POA: Diagnosis not present

## 2020-02-11 DIAGNOSIS — E785 Hyperlipidemia, unspecified: Secondary | ICD-10-CM | POA: Diagnosis not present

## 2020-02-11 DIAGNOSIS — Z20822 Contact with and (suspected) exposure to covid-19: Secondary | ICD-10-CM | POA: Diagnosis not present

## 2020-02-11 DIAGNOSIS — M797 Fibromyalgia: Secondary | ICD-10-CM | POA: Diagnosis not present

## 2020-02-11 DIAGNOSIS — H2511 Age-related nuclear cataract, right eye: Secondary | ICD-10-CM | POA: Insufficient documentation

## 2020-02-11 DIAGNOSIS — Z87891 Personal history of nicotine dependence: Secondary | ICD-10-CM | POA: Insufficient documentation

## 2020-02-11 DIAGNOSIS — I129 Hypertensive chronic kidney disease with stage 1 through stage 4 chronic kidney disease, or unspecified chronic kidney disease: Secondary | ICD-10-CM | POA: Diagnosis not present

## 2020-02-11 DIAGNOSIS — I130 Hypertensive heart and chronic kidney disease with heart failure and stage 1 through stage 4 chronic kidney disease, or unspecified chronic kidney disease: Secondary | ICD-10-CM | POA: Diagnosis not present

## 2020-02-11 DIAGNOSIS — I5030 Unspecified diastolic (congestive) heart failure: Secondary | ICD-10-CM | POA: Diagnosis not present

## 2020-02-11 DIAGNOSIS — K219 Gastro-esophageal reflux disease without esophagitis: Secondary | ICD-10-CM | POA: Insufficient documentation

## 2020-02-11 DIAGNOSIS — Z6841 Body Mass Index (BMI) 40.0 and over, adult: Secondary | ICD-10-CM | POA: Insufficient documentation

## 2020-02-11 DIAGNOSIS — G4733 Obstructive sleep apnea (adult) (pediatric): Secondary | ICD-10-CM | POA: Diagnosis not present

## 2020-02-11 DIAGNOSIS — E1122 Type 2 diabetes mellitus with diabetic chronic kidney disease: Secondary | ICD-10-CM | POA: Insufficient documentation

## 2020-02-11 DIAGNOSIS — E1142 Type 2 diabetes mellitus with diabetic polyneuropathy: Secondary | ICD-10-CM | POA: Insufficient documentation

## 2020-02-11 DIAGNOSIS — Z79891 Long term (current) use of opiate analgesic: Secondary | ICD-10-CM | POA: Diagnosis not present

## 2020-02-11 DIAGNOSIS — N183 Chronic kidney disease, stage 3 unspecified: Secondary | ICD-10-CM | POA: Diagnosis not present

## 2020-02-11 DIAGNOSIS — F410 Panic disorder [episodic paroxysmal anxiety] without agoraphobia: Secondary | ICD-10-CM | POA: Diagnosis not present

## 2020-02-11 HISTORY — PX: CATARACT EXTRACTION W/PHACO: SHX586

## 2020-02-11 LAB — BASIC METABOLIC PANEL
Anion gap: 9 (ref 5–15)
BUN: 46 mg/dL — ABNORMAL HIGH (ref 6–20)
CO2: 18 mmol/L — ABNORMAL LOW (ref 22–32)
Calcium: 8.5 mg/dL — ABNORMAL LOW (ref 8.9–10.3)
Chloride: 110 mmol/L (ref 98–111)
Creatinine, Ser: 3.13 mg/dL — ABNORMAL HIGH (ref 0.44–1.00)
GFR calc Af Amer: 20 mL/min — ABNORMAL LOW (ref >60)
GFR calc non Af Amer: 17 mL/min — ABNORMAL LOW (ref >60)
Glucose, Bld: 135 mg/dL — ABNORMAL HIGH (ref 70–99)
Potassium: 5.5 mmol/L — ABNORMAL HIGH (ref 3.5–5.1)
Sodium: 137 mmol/L (ref 135–145)

## 2020-02-11 LAB — SURGICAL PCR SCREEN
MRSA, PCR: NEGATIVE
Staphylococcus aureus: POSITIVE — AB

## 2020-02-11 LAB — CBC
HCT: 34.8 % — ABNORMAL LOW (ref 36.0–46.0)
Hemoglobin: 10.9 g/dL — ABNORMAL LOW (ref 12.0–15.0)
MCH: 25.5 pg — ABNORMAL LOW (ref 26.0–34.0)
MCHC: 31.3 g/dL (ref 30.0–36.0)
MCV: 81.3 fL (ref 80.0–100.0)
Platelets: 284 10*3/uL (ref 150–400)
RBC: 4.28 MIL/uL (ref 3.87–5.11)
RDW: 14 % (ref 11.5–15.5)
WBC: 5.2 10*3/uL (ref 4.0–10.5)
nRBC: 0 % (ref 0.0–0.2)

## 2020-02-11 LAB — SARS CORONAVIRUS 2 BY RT PCR (HOSPITAL ORDER, PERFORMED IN ~~LOC~~ HOSPITAL LAB): SARS Coronavirus 2: NEGATIVE

## 2020-02-11 LAB — POCT PREGNANCY, URINE: Preg Test, Ur: NEGATIVE

## 2020-02-11 LAB — GLUCOSE, CAPILLARY
Glucose-Capillary: 147 mg/dL — ABNORMAL HIGH (ref 70–99)
Glucose-Capillary: 97 mg/dL (ref 70–99)

## 2020-02-11 SURGERY — PHACOEMULSIFICATION, CATARACT, WITH IOL INSERTION
Anesthesia: Monitor Anesthesia Care | Site: Eye | Laterality: Right

## 2020-02-11 MED ORDER — CYCLOPENTOLATE HCL 1 % OP SOLN
1.0000 [drp] | OPHTHALMIC | Status: AC | PRN
  Administered 2020-02-11 (×3): 1 [drp] via OPHTHALMIC
  Filled 2020-02-11 (×2): qty 2

## 2020-02-11 MED ORDER — BSS IO SOLN
INTRAOCULAR | Status: AC
  Filled 2020-02-11: qty 15

## 2020-02-11 MED ORDER — LIDOCAINE HCL 3.5 % OP GEL
Freq: Once | OPHTHALMIC | Status: AC
  Filled 2020-02-11: qty 1

## 2020-02-11 MED ORDER — NA CHONDROIT SULF-NA HYALURON 40-30 MG/ML IO SOLN
INTRAOCULAR | Status: DC | PRN
  Administered 2020-02-11: 0.5 mL via INTRAOCULAR

## 2020-02-11 MED ORDER — HYDROMORPHONE HCL 1 MG/ML IJ SOLN
INTRAMUSCULAR | Status: DC
Start: 2020-02-11 — End: 2020-02-11
  Filled 2020-02-11: qty 1

## 2020-02-11 MED ORDER — EPINEPHRINE PF 1 MG/ML IJ SOLN
INTRAMUSCULAR | Status: AC
  Filled 2020-02-11: qty 1

## 2020-02-11 MED ORDER — ATROPINE SULFATE 1 % OP SOLN
OPHTHALMIC | Status: AC
  Filled 2020-02-11: qty 5

## 2020-02-11 MED ORDER — LIDOCAINE HCL 2 % IJ SOLN
INTRAMUSCULAR | Status: AC
  Filled 2020-02-11: qty 20

## 2020-02-11 MED ORDER — DEXAMETHASONE SODIUM PHOSPHATE 10 MG/ML IJ SOLN
INTRAMUSCULAR | Status: DC | PRN
  Administered 2020-02-11: 10 mg

## 2020-02-11 MED ORDER — OFLOXACIN 0.3 % OP SOLN
1.0000 [drp] | OPHTHALMIC | Status: AC | PRN
  Administered 2020-02-11 (×3): 1 [drp] via OPHTHALMIC
  Filled 2020-02-11 (×2): qty 5

## 2020-02-11 MED ORDER — CHLORHEXIDINE GLUCONATE 0.12 % MT SOLN: 15.0000 mL | Freq: Once | OROMUCOSAL | Status: AC

## 2020-02-11 MED ORDER — BSS PLUS IO SOLN
INTRAOCULAR | Status: AC
  Filled 2020-02-11: qty 500

## 2020-02-11 MED ORDER — PROVISC 10 MG/ML IO SOLN
INTRAOCULAR | Status: DC | PRN
  Administered 2020-02-11: .85 mL via INTRAOCULAR

## 2020-02-11 MED ORDER — BUPIVACAINE HCL (PF) 0.75 % IJ SOLN
INTRAMUSCULAR | Status: AC
  Filled 2020-02-11: qty 10

## 2020-02-11 MED ORDER — HYPROMELLOSE (GONIOSCOPIC) 2.5 % OP SOLN
OPHTHALMIC | Status: AC
  Filled 2020-02-11: qty 15

## 2020-02-11 MED ORDER — MIDAZOLAM HCL 2 MG/2ML IJ SOLN
INTRAMUSCULAR | Status: AC
  Filled 2020-02-11: qty 2

## 2020-02-11 MED ORDER — DEXAMETHASONE SODIUM PHOSPHATE 10 MG/ML IJ SOLN
INTRAMUSCULAR | Status: AC
  Filled 2020-02-11: qty 1

## 2020-02-11 MED ORDER — CEFAZOLIN SUBCONJUNCTIVAL INJECTION 100 MG/0.5 ML
INJECTION | SUBCONJUNCTIVAL | Status: DC | PRN
  Administered 2020-02-11: 100 mg via SUBCONJUNCTIVAL

## 2020-02-11 MED ORDER — HYPROMELLOSE (GONIOSCOPIC) 2.5 % OP SOLN
OPHTHALMIC | Status: DC | PRN
  Administered 2020-02-11: 1 [drp] via OPHTHALMIC

## 2020-02-11 MED ORDER — ACETYLCHOLINE CHLORIDE 20 MG IO SOLR
INTRAOCULAR | Status: AC
  Filled 2020-02-11: qty 1

## 2020-02-11 MED ORDER — INDOCYANINE GREEN 25 MG IV SOLR
INTRAVENOUS | Status: AC
  Filled 2020-02-11: qty 10

## 2020-02-11 MED ORDER — HYDROMORPHONE HCL 1 MG/ML IJ SOLN
0.2500 mg | INTRAMUSCULAR | Status: DC | PRN
  Administered 2020-02-11: 0.5 mg via INTRAVENOUS

## 2020-02-11 MED ORDER — TOBRAMYCIN-DEXAMETHASONE 0.3-0.1 % OP OINT
TOPICAL_OINTMENT | OPHTHALMIC | Status: AC
  Filled 2020-02-11: qty 3.5

## 2020-02-11 MED ORDER — EPINEPHRINE PF 1 MG/ML IJ SOLN
INTRAOCULAR | Status: DC | PRN
  Administered 2020-02-11: 500 mL

## 2020-02-11 MED ORDER — CEFAZOLIN SUBCONJUNCTIVAL INJECTION 100 MG/0.5 ML
100.0000 mg | INJECTION | SUBCONJUNCTIVAL | Status: DC
  Filled 2020-02-11 (×2): qty 5

## 2020-02-11 MED ORDER — FENTANYL CITRATE (PF) 100 MCG/2ML IJ SOLN
25.0000 ug | INTRAMUSCULAR | Status: DC | PRN
  Administered 2020-02-11: 25 ug via INTRAVENOUS

## 2020-02-11 MED ORDER — SODIUM CHLORIDE 0.9 % IV SOLN: INTRAVENOUS | Status: DC

## 2020-02-11 MED ORDER — HYALURONIDASE HUMAN 150 UNIT/ML IJ SOLN
INTRAMUSCULAR | Status: AC
  Filled 2020-02-11: qty 1

## 2020-02-11 MED ORDER — CHLORHEXIDINE GLUCONATE 0.12 % MT SOLN
OROMUCOSAL | Status: AC
  Administered 2020-02-11: 15 mL via OROMUCOSAL
  Filled 2020-02-11: qty 15

## 2020-02-11 MED ORDER — MIDAZOLAM HCL 2 MG/2ML IJ SOLN
INTRAMUSCULAR | Status: DC | PRN
  Administered 2020-02-11: 1 mg via INTRAVENOUS

## 2020-02-11 MED ORDER — TOBRAMYCIN-DEXAMETHASONE 0.3-0.1 % OP OINT
TOPICAL_OINTMENT | OPHTHALMIC | Status: DC | PRN
  Administered 2020-02-11: 1 via OPHTHALMIC

## 2020-02-11 MED ORDER — PHENYLEPHRINE HCL 2.5 % OP SOLN
1.0000 [drp] | OPHTHALMIC | Status: AC | PRN
  Administered 2020-02-11 (×3): 1 [drp] via OPHTHALMIC
  Filled 2020-02-11 (×2): qty 2

## 2020-02-11 MED ORDER — FENTANYL CITRATE (PF) 100 MCG/2ML IJ SOLN
INTRAMUSCULAR | Status: DC
Start: 2020-02-11 — End: 2020-02-11
  Filled 2020-02-11: qty 2

## 2020-02-11 MED ORDER — NA CHONDROIT SULF-NA HYALURON 40-30 MG/ML IO SOLN
INTRAOCULAR | Status: AC
  Filled 2020-02-11: qty 0.5

## 2020-02-11 MED ORDER — TRYPAN BLUE 0.15 % OP SOLN
OPHTHALMIC | Status: DC | PRN
  Administered 2020-02-11: 0.5 mL via INTRAVITREAL

## 2020-02-11 MED ORDER — PROPOFOL 10 MG/ML IV BOLUS
INTRAVENOUS | Status: DC | PRN
  Administered 2020-02-11: 20 mg via INTRAVENOUS
  Administered 2020-02-11: 10 mg via INTRAVENOUS

## 2020-02-11 MED ORDER — SODIUM HYALURONATE 10 MG/ML IO SOLN
INTRAOCULAR | Status: AC
  Filled 2020-02-11: qty 0.85

## 2020-02-11 MED ORDER — LIDOCAINE HCL 2 % IJ SOLN
INTRAMUSCULAR | Status: DC | PRN
  Administered 2020-02-11: 6 mL via RETROBULBAR

## 2020-02-11 MED ORDER — FENTANYL CITRATE (PF) 250 MCG/5ML IJ SOLN
INTRAMUSCULAR | Status: AC
  Filled 2020-02-11: qty 5

## 2020-02-11 MED ORDER — ACETYLCHOLINE CHLORIDE 20 MG IO SOLR
INTRAOCULAR | Status: DC | PRN
  Administered 2020-02-11: 5 mg via INTRAOCULAR

## 2020-02-11 SURGICAL SUPPLY — 66 items
BAND WRIST GAS GREEN (MISCELLANEOUS) IMPLANT
BLADE EYE SLIT 3.0 45D BEAV (BLADE) ×3 IMPLANT
BLADE KERATOME 2.75 (BLADE) ×2 IMPLANT
BLADE KERATOME 2.75MM (BLADE) ×1
BLADE MVR KNIFE 20G (BLADE) IMPLANT
BLADE STAB KNIFE 15DEG (BLADE) ×3 IMPLANT
BNDG EYE OVAL (GAUZE/BANDAGES/DRESSINGS) ×2 IMPLANT
CANNULA ANT CHAM MAIN (OPHTHALMIC RELATED) IMPLANT
CANNULA ANT/CHMB 27G (MISCELLANEOUS) IMPLANT
CANNULA ANT/CHMB 27GA (MISCELLANEOUS) ×3 IMPLANT
CANNULA DUAL BORE 23G (CANNULA) IMPLANT
CANNULA DUALBORE 25G (CANNULA) IMPLANT
CANNULA VLV SOFT TIP 25G (OPHTHALMIC) ×1 IMPLANT
CANNULA VLV SOFT TIP 25GA (OPHTHALMIC) ×3 IMPLANT
CARTRIDGE A MONARCH (MISCELLANEOUS) ×2 IMPLANT
CARTRIDGE C MONARCH III (MISCELLANEOUS) IMPLANT
CAUTERY EYE LOW TEMP 1300F FIN (OPHTHALMIC RELATED) IMPLANT
CLOSURE STERI-STRIP 1/2X4 (GAUZE/BANDAGES/DRESSINGS) ×1
CLSR STERI-STRIP ANTIMIC 1/2X4 (GAUZE/BANDAGES/DRESSINGS) ×2 IMPLANT
COVER MAYO STAND STRL (DRAPES) IMPLANT
DRAPE RETRACTOR (MISCELLANEOUS) ×3 IMPLANT
DRAPE SHEET LG 3/4 BI-LAMINATE (DRAPES) ×3 IMPLANT
GAS WRIST BAND GREEN (MISCELLANEOUS)
GLOVE SURG SYN 7.5  E (GLOVE) ×3
GLOVE SURG SYN 7.5 E (GLOVE) ×1 IMPLANT
GLOVE SURG SYN 7.5 PF PI (GLOVE) ×1 IMPLANT
GOWN STRL REUS W/ TWL LRG LVL3 (GOWN DISPOSABLE) ×1 IMPLANT
GOWN STRL REUS W/TWL LRG LVL3 (GOWN DISPOSABLE) ×3
KIT BASIN OR (CUSTOM PROCEDURE TRAY) ×3 IMPLANT
KIT TURNOVER KIT B (KITS) ×3 IMPLANT
KNIFE CRESCENT 1.75 EDGEAHEAD (BLADE) ×3 IMPLANT
LENS IOL ACRSF IQ PC 22.0 (Intraocular Lens) IMPLANT
LENS IOL ACRYSOF IQ POST 22.0 (Intraocular Lens) ×3 IMPLANT
NDL 18GX1X1/2 (RX/OR ONLY) (NEEDLE) ×1 IMPLANT
NDL 25GX 5/8IN NON SAFETY (NEEDLE) ×1 IMPLANT
NDL FILTER BLUNT 18X1 1/2 (NEEDLE) ×1 IMPLANT
NDL HYPO 25GX1X1/2 BEV (NEEDLE) IMPLANT
NDL HYPO 30X.5 LL (NEEDLE) ×2 IMPLANT
NDL RETROBULBAR 25GX1.5 (NEEDLE) ×1 IMPLANT
NEEDLE 18GX1X1/2 (RX/OR ONLY) (NEEDLE) ×3 IMPLANT
NEEDLE 25GX 5/8IN NON SAFETY (NEEDLE) ×3 IMPLANT
NEEDLE FILTER BLUNT 18X 1/2SAF (NEEDLE) ×2
NEEDLE FILTER BLUNT 18X1 1/2 (NEEDLE) ×1 IMPLANT
NEEDLE HYPO 25GX1X1/2 BEV (NEEDLE) IMPLANT
NEEDLE HYPO 30X.5 LL (NEEDLE) ×6 IMPLANT
NEEDLE RETROBULBAR 25GX1.5 (NEEDLE) ×3 IMPLANT
NS IRRIG 1000ML POUR BTL (IV SOLUTION) ×3 IMPLANT
PACK CATARACT MCHSCP (PACKS) ×2 IMPLANT
PACK CATARACT/VITRECTOMY 25GA (OPHTHALMIC) IMPLANT
PACK FRAGMATOME (OPHTHALMIC) IMPLANT
PACK VITRECTOMY CUSTOM (CUSTOM PROCEDURE TRAY) ×3 IMPLANT
PAD ARMBOARD 7.5X6 YLW CONV (MISCELLANEOUS) ×6 IMPLANT
RING MALYGIN (MISCELLANEOUS) ×3 IMPLANT
ROLLS DENTAL (MISCELLANEOUS) IMPLANT
SHIELD EYE LENSE ONLY DISP (GAUZE/BANDAGES/DRESSINGS) ×2 IMPLANT
SOL ANTI FOG 6CC (MISCELLANEOUS) IMPLANT
SOLUTION ANTI FOG 6CC (MISCELLANEOUS) ×2
SUT ETHILON 10 0 CS140 6 (SUTURE) IMPLANT
SUT VICRYL 7 0 TG140 8 (SUTURE) IMPLANT
SUT VICRYL 8 0 TG140 8 (SUTURE) IMPLANT
SYR 10ML LL (SYRINGE) IMPLANT
SYR 20ML LL LF (SYRINGE) ×3 IMPLANT
SYR 5ML LL (SYRINGE) ×3 IMPLANT
SYR TB 1ML LUER SLIP (SYRINGE) ×3 IMPLANT
TIP ABS 45DEG FLARED 0.9MM (TIP) ×3 IMPLANT
WATER STERILE IRR 1000ML POUR (IV SOLUTION) ×3 IMPLANT

## 2020-02-11 NOTE — Brief Op Note (Signed)
02/11/2020  4:13 PM  PATIENT:  [name] [name]  44 y.o. female  PRE-OPERATIVE DIAGNOSIS:  AGE RELATED CATARACT RIGHT EYE  POST-OPERATIVE DIAGNOSIS:  AGE RELATED CATARACT RIGHT EYE  PROCEDURE:  Procedure(s): CATARACT EXTRACTION PHACO AND INTRAOCULAR LENS PLACEMENT (IOC) (Right)  SURGEON:  Surgeon(s) and Role:    * Jalene Mullet, MD - Primary  PHYSICIAN ASSISTANT:   ASSISTANTS: none   ANESTHESIA:   local and MAC  EBL:  none   BLOOD ADMINISTERED:none  DRAINS: none   LOCAL MEDICATIONS USED:  MARCAINE    and LIDOCAINE   SPECIMEN:  No Specimen  DISPOSITION OF SPECIMEN:  N/A  COUNTS:  YES  TOURNIQUET:  * No tourniquets in log *  DICTATION: .Note written in EPIC  PLAN OF CARE: Discharge to home after PACU  PATIENT DISPOSITION:  PACU - hemodynamically stable.   Delay start of Pharmacological VTE agent (>24hrs) due to surgical blood loss or risk of bleeding: not applicable

## 2020-02-11 NOTE — Anesthesia Postprocedure Evaluation (Signed)
Anesthesia Post Note  Patient: [name] [name]  Procedure(s) Performed: CATARACT EXTRACTION PHACO AND INTRAOCULAR LENS PLACEMENT (IOC) (Right Eye)     Patient location during evaluation: PACU Anesthesia Type: MAC Level of consciousness: awake and alert Pain management: pain level controlled Vital Signs Assessment: post-procedure vital signs reviewed and stable Respiratory status: spontaneous breathing, nonlabored ventilation, respiratory function stable and patient connected to nasal cannula oxygen Cardiovascular status: stable and blood pressure returned to baseline Postop Assessment: no apparent nausea or vomiting Anesthetic complications: no   No complications documented.  Last Vitals:  Vitals:   02/11/20 1631 02/11/20 1645  BP: 112/90 (!) 153/86  Pulse: 87 85  Resp: 13 17  Temp:  36.4 C  SpO2: 100% 100%    Last Pain:  Vitals:   02/11/20 1645  PainSc: 4                  Jameson Morrow COKER

## 2020-02-11 NOTE — Discharge Instructions (Addendum)
Keep patch in place

## 2020-02-11 NOTE — Anesthesia Procedure Notes (Signed)
Procedure Name: MAC Date/Time: 02/11/2020 3:33 PM Performed by: Amadeo Garnet, CRNA Pre-anesthesia Checklist: Patient identified, Emergency Drugs available, Suction available and Patient being monitored Patient Re-evaluated:Patient Re-evaluated prior to induction Oxygen Delivery Method: Nasal cannula Preoxygenation: Pre-oxygenation with 100% oxygen Induction Type: IV induction Placement Confirmation: positive ETCO2 Dental Injury: Teeth and Oropharynx as per pre-operative assessment

## 2020-02-11 NOTE — Transfer of Care (Signed)
**Note [name]-Identified via Obfuscation** Immediate Anesthesia Transfer of Care Note  Patient: [name] [name]  Procedure(s) Performed: CATARACT EXTRACTION PHACO AND INTRAOCULAR LENS PLACEMENT (IOC) (Right Eye)  Patient Location: PACU  Anesthesia Type:MAC  Level of Consciousness: awake, alert  and oriented  Airway & Oxygen Therapy: Patient Spontanous Breathing  Post-op Assessment: Report given to RN, Post -op Vital signs reviewed and stable and Patient moving all extremities  Post vital signs: Reviewed and stable  Last Vitals:  Vitals Value Taken Time  BP 139/79 02/11/20 1615  Temp    Pulse 88 02/11/20 1616  Resp 19 02/11/20 1616  SpO2 100 % 02/11/20 1616  Vitals shown include unvalidated device data.  Last Pain:  Vitals:   02/11/20 1333  PainSc: 0-No pain         Complications: No complications documented.

## 2020-02-11 NOTE — H&P (Signed)
Date of examination:  02/11/20  Indication for surgery: brunescent cataract right eye  Pertinent past medical history:  Past Medical History:  Diagnosis Date  . Abdominal muscle pain 09/08/2016  . Abnormal Pap smear of cervix 2009  . Abscess    history of multiple abscesses  . Acute bilateral low back pain 02/13/2017  . Acute blood loss anemia   . Acute on chronic renal failure ([place]) 07/12/2012  . Acute renal failure ([place]) 07/12/2012  . Adjustment disorder with depressed mood 03/26/2017  . AKI (acute kidney injury) ([place])   . Anemia of chronic disease 2002  . Anxiety    Panic attacks  . Bilateral lower extremity edema 05/13/2016  . Bipolar disorder ([place])   . Cataract    B/L cataract  . Cellulitis 05/21/2014   right eye  . Chronic bronchitis ([place])    "get it q yr" (05/13/2013)  . Chronic pain   . Dehiscence of amputation stump ([place])   . Depression   . Edema of lower extremity   . Endocarditis 2002   subacute bacterial endocarditis.   . Family history of anesthesia complication    "my mom has a hard time coming out from under"  . Fibromyalgia   . GERD (gastroesophageal reflux disease)    occ  . Heart murmur   . Herpes simplex type 1 infection 01/16/2018  . History of blood transfusion    "just low blood count" (05/13/2013)  . Hyperlipidemia   . Hypertension   . Hypothyroidism   . Hypothyroidism, adult 03/21/2014  . Hypoxia   . Leukocytosis   . Necrosis ([place])    and ulceration  . Necrotizing fasciitis s/p OR debridements 07/06/2012  . Obesity   . OSA on CPAP    does not wear all the time  . Peripheral neuropathy   . Pneumonia   . Type II diabetes mellitus (HCC)    Type  II  . Wound dehiscence 09/04/2019    Pertinent ocular history: hemorrhagic retinal detachment right eye  Pertinent family history:  Family History  Problem Relation Age of Onset  . Heart failure Mother   . Diabetes Mother   . Kidney disease Mother   . Kidney disease Father   . Diabetes Father    . Diabetes Paternal Grandmother   . Heart failure Paternal Grandmother   . Other Other     General:  Healthy appearing patient in no distress.    Eyes:    Acuity OD CF    External: Within normal limits     Anterior segment: Within normal limits       Fundus: No view due to dense cataract OD    Impression: Brunescent cataract right eye  Plan: cataract extraction with intraocular lens right eye  Jalene Mullet, MD

## 2020-02-12 ENCOUNTER — Ambulatory Visit: Payer: Medicare HMO | Attending: Orthopedic Surgery | Admitting: Rehabilitation

## 2020-02-12 ENCOUNTER — Telehealth: Payer: Self-pay | Admitting: Rehabilitation

## 2020-02-12 ENCOUNTER — Other Ambulatory Visit: Payer: Self-pay

## 2020-02-12 ENCOUNTER — Encounter (HOSPITAL_COMMUNITY): Payer: Self-pay | Admitting: Ophthalmology

## 2020-02-12 DIAGNOSIS — R293 Abnormal posture: Secondary | ICD-10-CM | POA: Insufficient documentation

## 2020-02-12 DIAGNOSIS — M25652 Stiffness of left hip, not elsewhere classified: Secondary | ICD-10-CM | POA: Insufficient documentation

## 2020-02-12 DIAGNOSIS — R2689 Other abnormalities of gait and mobility: Secondary | ICD-10-CM | POA: Insufficient documentation

## 2020-02-12 DIAGNOSIS — R296 Repeated falls: Secondary | ICD-10-CM | POA: Insufficient documentation

## 2020-02-12 DIAGNOSIS — R2681 Unsteadiness on feet: Secondary | ICD-10-CM

## 2020-02-12 NOTE — Telephone Encounter (Signed)
Dr. Johnney Ou,   I just evaluated [name] [name] here at [facility] neuro for PT with her new TFA prosthesis.  We were unable to get into socket during session due to marked swelling.  She reports that she often has 15 lbs of fluctuating fluids and is on several medications that aren't helping swelling.  I believe she is seeing you tomorrow and I would love for this to be addressed as she will be greatly limited in PT if we can't get her into her prosthesis.    Thanks so much!! Cameron Sprang, PT, MPT [facility] [address] [place], [place], [address] Phone: [phone]   Fax:  [phone] 02/12/20, 6:05 PM

## 2020-02-12 NOTE — Therapy (Signed)
[place] [address] [place], [place], [address] Phone: [phone]   Fax:  [phone]  Physical Therapy Evaluation  Patient Details  Name: [name] [name] MRN: [id number] Date of Birth: [birth year] Referring Provider (PT): Meridee Score, MD   Encounter Date: 02/12/2020   PT End of Session - 02/12/20 1735    Visit Number 1    Number of Visits 17    Date for PT Re-Evaluation 04/12/20    Authorization Type [employer] [place] and Medicaid (10th visit needed)    PT Start Time 1620    PT Stop Time 1705    PT Time Calculation (min) 45 min    Activity Tolerance Patient limited by pain;Treatment limited secondary to medical complications (Comment)    Behavior During Therapy [facility] for tasks assessed/performed           Past Medical History:  Diagnosis Date  . Abdominal muscle pain 09/08/2016  . Abnormal Pap smear of cervix 2009  . Abscess    history of multiple abscesses  . Acute bilateral low back pain 02/13/2017  . Acute blood loss anemia   . Acute on chronic renal failure ([place]) 07/12/2012  . Acute renal failure ([place]) 07/12/2012  . Adjustment disorder with depressed mood 03/26/2017  . AKI (acute kidney injury) ([place])   . Anemia of chronic disease 2002  . Anxiety    Panic attacks  . Bilateral lower extremity edema 05/13/2016  . Bipolar disorder ([place])   . Cataract    B/L cataract  . Cellulitis 05/21/2014   right eye  . Chronic bronchitis ([place])    "get it q yr" (05/13/2013)  . Chronic pain   . Dehiscence of amputation stump ([place])   . Depression   . Edema of lower extremity   . Endocarditis 2002   subacute bacterial endocarditis.   . Family history of anesthesia complication    "my mom has a hard time coming out from under"  . Fibromyalgia   . GERD (gastroesophageal reflux disease)    occ  . Heart murmur   . Herpes simplex type 1 infection 01/16/2018  . History of blood transfusion    "just low blood count"  (05/13/2013)  . Hyperlipidemia   . Hypertension   . Hypothyroidism   . Hypothyroidism, adult 03/21/2014  . Hypoxia   . Leukocytosis   . Necrosis ([place])    and ulceration  . Necrotizing fasciitis s/p OR debridements 07/06/2012  . Obesity   . OSA on CPAP    does not wear all the time  . Peripheral neuropathy   . Pneumonia   . Type II diabetes mellitus (HCC)    Type  II  . Wound dehiscence 09/04/2019    Past Surgical History:  Procedure Laterality Date  . ABDOMINAL AORTOGRAM W/LOWER EXTREMITY Left 03/19/2019   Procedure: ABDOMINAL AORTOGRAM W/LOWER EXTREMITY;  Surgeon: Serafina Mitchell, MD;  Location: [place] [facility];  Service: Cardiovascular;  Laterality: Left;  . ABOVE KNEE LEG AMPUTATION Left 10/11/2019   wound dehisence   . AIR/FLUID EXCHANGE Right 12/31/2019   Procedure: AIR/FLUID EXCHANGE;  Surgeon: Jalene Mullet, MD;  Location: [place];  Service: Ophthalmology;  Laterality: Right;  . AMPUTATION Left 04/07/2017   Procedure: LEFT BELOW KNEE AMPUTATION;  Surgeon: Newt Minion, MD;  Location: [place];  Service: Orthopedics;  Laterality: Left;  . AMPUTATION Left 10/11/2019   Procedure: LEFT ABOVE KNEE AMPUTATION;  Surgeon: Newt Minion, MD;  Location: [place];  Service: Orthopedics;  Laterality: Left;  . CATARACT EXTRACTION W/PHACO Right 02/11/2020   Procedure: CATARACT EXTRACTION PHACO AND INTRAOCULAR LENS PLACEMENT (IOC);  Surgeon: Jalene Mullet, MD;  Location: [place];  Service: Ophthalmology;  Laterality: Right;  . CORONARY ARTERY BYPASS GRAFT    . EYE SURGERY     lazer  . GAS INSERTION Right 12/31/2019   Procedure: INSERTION OF GAS;  Surgeon: Jalene Mullet, MD;  Location: [place];  Service: Ophthalmology;  Laterality: Right;  . INCISION AND DRAINAGE ABSCESS     multiple I&Ds  . INCISION AND DRAINAGE ABSCESS Left 07/09/2012   Procedure: DRESSING CHANGE, THIGH WOUND;  Surgeon: Harl Bowie, MD;  Location: [place];  Service: General;  Laterality: Left;  . INCISION AND  DRAINAGE OF WOUND Left 07/07/2012   Procedure: IRRIGATION AND DEBRIDEMENT WOUND;  Surgeon: Harl Bowie, MD;  Location: [place];  Service: General;  Laterality: Left;  . INCISION AND DRAINAGE PERIRECTAL ABSCESS Left 07/14/2012   Procedure: DEBRIDEMENT OF SKIN & SOFT TISSUE; DRESSING CHANGE UNDER ANESTHESIA;  Surgeon: Gayland Curry, MD,FACS;  Location: [place];  Service: General;  Laterality: Left;  . INCISION AND DRAINAGE PERIRECTAL ABSCESS Left 07/16/2012   Procedure: I&D Left Thigh;  Surgeon: Gwenyth Ober, MD;  Location: [place];  Service: General;  Laterality: Left;  . INCISION AND DRAINAGE PERIRECTAL ABSCESS N/A 01/05/2015   Procedure: IRRIGATION AND DEBRIDEMENT PERIRECTAL ABSCESS;  Surgeon: Donnie Mesa, MD;  Location: [place];  Service: General;  Laterality: N/A;  . IRRIGATION AND DEBRIDEMENT ABSCESS Left 07/06/2012   Procedure: IRRIGATION AND DEBRIDEMENT ABSCESS BUTTOCKS AND THIGH;  Surgeon: Shann Medal, MD;  Location: [place];  Service: General;  Laterality: Left;  . IRRIGATION AND DEBRIDEMENT ABSCESS Left 08/10/2012   Procedure: IRRIGATION AND DEBRIDEMENT ABSCESS;  Surgeon: Madilyn Hook, DO;  Location: [place];  Service: General;  Laterality: Left;  . PARS PLANA VITRECTOMY Right 12/31/2019   Procedure: PARS PLANA VITRECTOMY WITH 25 GAUGE;  Surgeon: Jalene Mullet, MD;  Location: [facility];  Service: Ophthalmology;  Laterality: Right;  . PHOTOCOAGULATION WITH LASER Right 12/31/2019   Procedure: PHOTOCOAGULATION WITH LASER;  Surgeon: Jalene Mullet, MD;  Location: [facility];  Service: Ophthalmology;  Laterality: Right;  . REPAIR OF COMPLEX TRACTION RETINAL DETACHMENT Right 12/31/2019   Procedure: REPAIR OF COMPLEX TRACTION RETINAL;  Surgeon: Jalene Mullet, MD;  Location: [facility];  Service: Ophthalmology;  Laterality: Right;  . STUMP REVISION Left 06/09/2017   Procedure: REVISION LEFT BELOW KNEE AMPUTATION;  Surgeon: Newt Minion, MD;  Location: [place];  Service: Orthopedics;  Laterality: Left;  . STUMP  REVISION Left 09/04/2019   Procedure: LEFT BELOW KNEE AMPUTATION REVISION;  Surgeon: Newt Minion, MD;  Location: [facility];  Service: Orthopedics;  Laterality: Left;  . STUMP REVISION Left 09/20/2019   Procedure: LEFT BELOW KNEE AMPUTATION REVISION;  Surgeon: Newt Minion, MD;  Location: [facility];  Service: Orthopedics;  Laterality: Left;  . TONSILLECTOMY  1994    There were no vitals filed for this visit.    Subjective Assessment - 02/12/20 1625    Subjective "I'm anxious about this.  I'm going to have to learn to walk all over again."    Limitations Walking;House hold activities;Standing    Patient Stated Goals "I want to be able to walk on this leg and move around"    Currently in Pain? No/denies   does have chronic back pain  [facility] PT Assessment - 02/12/20 1628      Assessment   Medical Diagnosis L AKA    Referring Provider (PT) Meridee Score, MD    Onset Date/Surgical Date --   May 2021     Precautions   Precautions Fall      Balance Screen   Has the patient fallen in the past 6 months Yes    How many times? 3    Has the patient had a decrease in activity level because of a fear of falling?  Yes    Is the patient reluctant to leave their home because of a fear of falling?  Yes      [place] Private residence    Living Arrangements Other relatives   God daughter and cousin   Available Help at Discharge Family;Available 24 hours/day   Has had an aide, out with Covid    Type of [place] to enter    Entrance Stairs-Number of Steps 3    Entrance Stairs-Rails None   bumps up on bottom   Home Layout One level    Home Equipment Wheelchair - manual;Walker - 2 wheels;Cane - single point;Shower seat;Bedside commode      Prior Function   Level of Independence Independent with basic ADLs;Independent with transfers    Vocation On disability      Cognition   Overall Cognitive Status Within Functional Limits for  tasks assessed      Sensation   Light Touch Impaired Detail    Light Touch Impaired Details Impaired LLE      Coordination   Gross Motor Movements are Fluid and Coordinated Yes    Fine Motor Movements are Fluid and Coordinated Yes      Posture/Postural Control   Posture/Postural Control Postural limitations    Postural Limitations Rounded Shoulders;Forward head;Increased lumbar lordosis      ROM / Strength   AROM / PROM / Strength AROM;Strength      AROM   Overall AROM  Deficits    Overall AROM Comments slight tightness in L hip flex, able to get to neutral       Strength   Overall Strength Within functional limits for tasks performed      Transfers   Transfers Sit to Stand;Stand to Sit;Stand Pivot Transfers    Sit to Stand 4: Min assist;With upper extremity assist    Sit to Stand Details Verbal cues for sequencing;Verbal cues for technique;Verbal cues for precautions/safety;Manual facilitation for weight shifting    Sit to Stand Details (indicate cue type and reason) Cues for hand placement and technique with TFA    Stand to Sit 4: Min assist;With upper extremity assist    Stand to Sit Details (indicate cue type and reason) Verbal cues for sequencing;Verbal cues for technique;Verbal cues for precautions/safety    Stand to Sit Details Cues for safety.  Knee does not bend during stand to sit     Stand Pivot Transfers 4: Min guard      Ambulation/Gait   Ambulation/Gait Yes    Ambulation/Gait Assistance 3: Mod assist    Ambulation/Gait Assistance Details Pt only able to take 2 steps as pt unable to get fully seated in prosthesis.      Ambulation Distance (Feet) 2 Feet    Assistive device Rolling walker    Gait Pattern Step-to pattern;Decreased stride length;Lateral hip instability;Lateral trunk lean to right;Trunk flexed;Wide base of support    Ambulation  Surface Level;Indoor           Prosthetics Assessment - 02/12/20 [place]  with Skin check    Prosthetic Care Dependent with Residual limb care;Care of non-amputated limb;Proper wear schedule/adjustment;Proper weight-bearing schedule/adjustment;Correct ply sock adjustment    Prosthetic Care Comments  Discussed that pt needs to wear shrinker at all times (she reports that she typically does but removed 1 hour prior to our session).  She reports large fluid fluctuations and does see PCP tomorrow so that hopefully if medications need to be adjusted they can be.  Discussed wearing leg/liner x 2 hours per day and then wearing liner 1 hour prior to next therapy session to see if fluid can be better managed this way.     Donning prosthesis  Max assist    Doffing prosthesis  Min assist    Current prosthetic wear tolerance (days/week)  daily     Current prosthetic wear tolerance (#hours/day)  30-40 mins    Current prosthetic weight-bearing tolerance (hours/day)  has only stood briefly     Edema marked edema noted (did not get circumferencial measurements)     Residual limb condition  edema noted, good scar healing and normal hair growth    Prosthesis Description suction ring suspesion with hydrolic knee component     K code/activity level with prosthetic use  possible K3 community ambulator with possibility of variable cadence.                       Objective measurements completed on examination: See above findings.               PT Education - 02/12/20 1734    Education Details Education on proper diet, speaking with PCP tomorrow regarding medication management as she has extreme fluctuating amounts of fluid, goals, POC    Person(s) Educated Patient    Methods Explanation    Comprehension Verbalized understanding            PT Short Term Goals - 02/12/20 1751      PT SHORT TERM GOAL #1   Title Pt will be independent with initial standing HEP with prosthesis.   (Target Date: 03/13/20)    Time 4    Period Weeks    Status New    Target Date  03/13/20      PT SHORT TERM GOAL #2   Title Pt will be able to don prosthesis at S level and tolerate wearing prosthesis 5 hrs, 2x/day with no skin issues and with good understanding of swelling management.    Time 4    Period Weeks    Status New      PT SHORT TERM GOAL #3   Title Will assess standing balance and update STG/LTG as appropriate.    Time 4    Period Weeks    Status New      PT SHORT TERM GOAL #4   Title Will assess gait and update STG/LTG as appropriate.    Time 4    Period Weeks    Status New      PT SHORT TERM GOAL #5   Title Will assess gait speed and update STG/LTG as appropriate.    Time 4    Period Weeks    Status New             PT Long Term Goals - 02/12/20 1753  PT LONG TERM GOAL #1   Title Pt will be independent with final HEP in order to indicate improved functional mobility and balance with use of prosthesis.  (Target Date: 04/12/20)    Time 8    Period Weeks    Status New    Target Date 04/12/20      PT LONG TERM GOAL #2   Title Pt will be able to don prosthesis at mod I level and tolerate wearing for 90% of awake hours without skin breakdown and with good understanding of swelling mangement.    Time 8    Period Weeks    Status New      PT LONG TERM GOAL #3   Title Pt will tolerate standing x 15 mins with intermittent UE support, 10" forward reach and ability to pick up item from floor with UE support at S level.    Time 8    Period Weeks    Status New      PT LONG TERM GOAL #4   Title Gait speed TBD    Time 8    Period Weeks    Status New      PT LONG TERM GOAL #5   Title Gait goal TBD    Time 8    Period Weeks    Status New                  Plan - 02/12/20 1736    Clinical Impression Statement Pt is pleasant 44 y/o with complex medical history of stage III renal failure, anxiety, depression, HTN, OSA, DMII and chronic back and R knee pain (did just recieve injection in knee).  She presents with new L TFA in  09/2019 with hx of BKA to AKA with several revisions.  Upon PT evaluation, note marked swelling in BLEs and PT/pt unable to get her fully seated in socket.  She was able to stand and lift RLE several times to get seated but needs max UE support and demos poor posture with increasing back pain.  Provided prosthetic and residual limb education (see note).  Pt will benefit from skilled OP PT in order to address deficits.    Personal Factors and Comorbidities Comorbidity 3+;Past/Current Experience    Comorbidities see above    Examination-Activity Limitations Bend;Carry;Dressing;Stand;Stairs;Squat;Locomotion Level;Transfers    Examination-Participation Restrictions Community Activity    Stability/Clinical Decision Making Evolving/Moderate complexity    Clinical Decision Making Moderate    Rehab Potential Good    PT Frequency 2x / week    PT Duration 8 weeks    PT Treatment/Interventions ADLs/Self Care Home Management;DME Instruction;Gait training;Stair training;Functional mobility training;Therapeutic activities;Therapeutic exercise;Balance training;Neuromuscular re-education;Patient/family education;Prosthetic Training;Compression bandaging;Passive range of motion;Energy conservation    PT Next Visit Plan If able to get into prosthesis-assess standing balance (unsupported/supported, forward reach, downward reach), try gait-get a gait speed, if able to tolerate wearing 2hours a day, increase to 2hours 2x/day    Consulted and Agree with Plan of Care Patient           Patient will benefit from skilled therapeutic intervention in order to improve the following deficits and impairments:  Abnormal gait, Decreased activity tolerance, Decreased balance, Decreased endurance, Decreased knowledge of precautions, Decreased knowledge of use of DME, Decreased mobility, Decreased range of motion, Decreased strength, Difficulty walking, Increased edema, Impaired perceived functional ability, Impaired flexibility,  Impaired sensation, Postural dysfunction, Improper body mechanics, Prosthetic Dependency  Visit Diagnosis: Unsteadiness on feet  Other abnormalities of gait and mobility  Repeated falls  Hip stiffness, left  Abnormal posture     Problem List Patient Active Problem List   Diagnosis Date Noted  . Chronic pain syndrome 01/16/2020  . Hx of AKA (above knee amputation), left ([place])   . Symptomatic anemia 10/21/2019  . Anemia 10/21/2019  . Acute respiratory failure with hypoxia ([place]) 10/21/2019  . Blurry vision, right eye 10/08/2019  . Tobacco use 03/01/2019  . Phantom limb pain ([place]) 11/08/2018  . Current moderate episode of major depressive disorder ([place])   . Hyperkalemia   . Anemia of chronic disease   . Severe protein-calorie malnutrition ([place])   . Fall   . Status post below knee amputation, left ([place]) 04/11/2017  . Bipolar affective disorder ([place])   . Heart failure with preserved ejection fraction ([place]) 02/09/2017  . Routine adult health maintenance 12/08/2016  . Chronic pain of right knee 09/08/2016  . Chronic kidney disease (CKD), stage III (moderate) 07/15/2016  . OSA (obstructive sleep apnea) 05/13/2016  . Morbid obesity due to excess calories ([place]) 11/27/2015  . Hypothyroidism 04/25/2013  . Controlled type 2 diabetes mellitus ([place]) 07/12/2012  . Carpal tunnel syndrome, bilateral 01/25/2012  . Diabetic polyneuropathy associated with type 2 diabetes mellitus ([place]) 07/04/2011  . Anxiety and depression 06/02/2011  . GERD 12/06/2007  . Hyperlipidemia 08/29/2007  . Hypertension associated with diabetes ([place]) 08/29/2007    Cameron Sprang, PT, MPT [facility] [address] [place], [place], [address] Phone: [phone]   Fax:  [phone] 02/12/20, 6:01 PM  Name: [name] [name] MRN: [id number] Date of Birth: 1975-09-06

## 2020-02-13 ENCOUNTER — Ambulatory Visit (HOSPITAL_COMMUNITY)
Admission: RE | Admit: 2020-02-13 | Discharge: 2020-02-13 | Disposition: A | Discharge status: Home / Self Care | Payer: Medicare HMO | Source: Ambulatory Visit | Attending: Family Medicine | Admitting: Family Medicine

## 2020-02-13 ENCOUNTER — Other Ambulatory Visit: Payer: Self-pay

## 2020-02-13 ENCOUNTER — Ambulatory Visit (INDEPENDENT_AMBULATORY_CARE_PROVIDER_SITE_OTHER): Payer: Medicare HMO | Admitting: Student

## 2020-02-13 ENCOUNTER — Encounter: Payer: Self-pay | Admitting: Student

## 2020-02-13 VITALS — BP 117/72 | HR 88 | Temp 98.3°F | Ht 69.5 in | Wt 293.2 lb

## 2020-02-13 DIAGNOSIS — E1159 Type 2 diabetes mellitus with other circulatory complications: Secondary | ICD-10-CM | POA: Diagnosis not present

## 2020-02-13 DIAGNOSIS — F419 Anxiety disorder, unspecified: Secondary | ICD-10-CM

## 2020-02-13 DIAGNOSIS — E1121 Type 2 diabetes mellitus with diabetic nephropathy: Secondary | ICD-10-CM | POA: Diagnosis not present

## 2020-02-13 DIAGNOSIS — Z9189 Other specified personal risk factors, not elsewhere classified: Secondary | ICD-10-CM | POA: Diagnosis not present

## 2020-02-13 DIAGNOSIS — G894 Chronic pain syndrome: Secondary | ICD-10-CM | POA: Diagnosis not present

## 2020-02-13 DIAGNOSIS — Z0000 Encounter for general adult medical examination without abnormal findings: Secondary | ICD-10-CM

## 2020-02-13 DIAGNOSIS — Z89612 Acquired absence of left leg above knee: Secondary | ICD-10-CM | POA: Diagnosis not present

## 2020-02-13 DIAGNOSIS — F329 Major depressive disorder, single episode, unspecified: Secondary | ICD-10-CM

## 2020-02-13 DIAGNOSIS — Z0001 Encounter for general adult medical examination with abnormal findings: Secondary | ICD-10-CM | POA: Diagnosis not present

## 2020-02-13 DIAGNOSIS — I10 Essential (primary) hypertension: Secondary | ICD-10-CM

## 2020-02-13 LAB — POCT GLYCOSYLATED HEMOGLOBIN (HGB A1C): Hemoglobin A1C: 8.5 % — AB (ref 4.0–5.6)

## 2020-02-13 LAB — GLUCOSE, CAPILLARY: Glucose-Capillary: 246 mg/dL — ABNORMAL HIGH (ref 70–99)

## 2020-02-13 MED ORDER — AMITRIPTYLINE HCL 100 MG PO TABS: 100.0000 mg | ORAL_TABLET | Freq: Every day | ORAL | 0 refills | Status: DC

## 2020-02-13 MED ORDER — CYCLOBENZAPRINE HCL 5 MG PO TABS: ORAL_TABLET | ORAL | 0 refills | Status: DC

## 2020-02-13 MED ORDER — HYDROXYZINE HCL 25 MG PO TABS: ORAL_TABLET | ORAL | 0 refills | Status: DC

## 2020-02-13 MED ORDER — EMPAGLIFLOZIN 10 MG PO TABS: 10.0000 mg | ORAL_TABLET | Freq: Every day | ORAL | 0 refills | Status: DC

## 2020-02-13 NOTE — Assessment & Plan Note (Signed)
Patient reports compliance with medication. Denies chest pain, headache.  BP Readings from Last 3 Encounters:  02/13/20 117/72  02/11/20 (!) 153/86  01/16/20 (!) 164/92   -Continue amlodipine 10mg  once per day, Toprolol 25mg  once per day, lisinopril 10mg  once per day, hydralazine 25mg  three times per day

## 2020-02-13 NOTE — Assessment & Plan Note (Signed)
Patient reports Percocet has helped with pain, but pain not completely resolved. She recently had anterior steroid injection on right knee. Mentions 1-2 days later she had burning, raised reaction above her knee. Burning lasted a few days and has not had any symptoms since. No pain or pruritis at the site. Notes this is not in the injection site. Denies bug bites, burns at site.   A/P: -Will re-fill Percocet pending tox screen -Unclear on etiology of lesion, as it was not on the injection site. Discussed with patient she will need to follow-up with her orthopedic physician for further recommendations. -Patient will require electric scooter, as this will able her to be more mobile due to her chronic pain. Her current manual wheelchair does not allow her to have mobility while functioning ADL's and taking care of her children.

## 2020-02-13 NOTE — Assessment & Plan Note (Addendum)
Patient endorses worsening anxiety and depression. Mentions she often feels overwhelmed due to the frequent doctor visits and caring for her children. States she often feels sad, has poor sleep, and has difficulty concentrating.   A/P: -Patient has been taking fluoxetine 60mg  per day and amitriptyline 50mg  once at bedtime for sleep. -ECG today revealed normal QT -Increasing amitriptyline 100mg . Explained to patient side effects of TCA, including arrhythmias. Also explained that we may have to refer to psychiatrist if no improvement, as they can better assist with medication-resistant depression. Patient would like to hold off on psychiatrist referral now due to numerous doctor visits.

## 2020-02-13 NOTE — Assessment & Plan Note (Signed)
Patient believes she received the influenza vaccine while hospitalized. She said she is hesitant about the COVID-19 vaccine. Explained how the COVID-19 vaccine works and that side effects typically happen within the first week or two from injection. Gave her information regarding resources for obtaining the COVID-19, including [place] COVID-19 hotline.

## 2020-02-13 NOTE — Patient Instructions (Addendum)
Ms. Demeyer,  It was a pleasure seeing you today!  We discussed your diabetes medications. Your A1c today was 8.5, the same as it was in May. We will continue with the Victoza and add Jardiance 10mg , once daily. Please make sure to continue watching your diet as well.  We also discussed your depression medication. We are getting an EKG on you today to make sure your heart looks good. Thankfully, everything looks great, so we will increase amitryptiline to 100mg , once at night. If you feel palpitations or heart burn, please stop taking the medication and contact the office.  Here is the number for the COVID-19 hotline at [facility]: [phone]. You can take the COVID shot at our clinic or at any pharmacy. It is free, effective, and safe.  We look forward to seeing you next time. Please call our clinic at [phone] if you have any questions or concerns. The best time to call is Monday-Friday from 9am-4pm, but there is someone available 24/7 at the same number. If you need medication refills, please notify your pharmacy one week in advance and they will send [place] a request.  Thank you for letting us take part in your care. Wishing you the best!  Thank you, Dr. Sanjuan Dame, MD

## 2020-02-13 NOTE — Progress Notes (Signed)
   CC: diabetes follow-up  HPI:  [name] [name] is a 44 y.o. with medical history as listed below who presents for diabetes follow-up.  Please see problem-based list for further evaluation, details.  Past Medical History:  Diagnosis Date  . Abdominal muscle pain 09/08/2016  . Abnormal Pap smear of cervix 2009  . Abscess    history of multiple abscesses  . Acute bilateral low back pain 02/13/2017  . Acute blood loss anemia   . Acute on chronic renal failure ([place]) 07/12/2012  . Acute renal failure ([place]) 07/12/2012  . Adjustment disorder with depressed mood 03/26/2017  . AKI (acute kidney injury) ([place])   . Anemia of chronic disease 2002  . Anxiety    Panic attacks  . Bilateral lower extremity edema 05/13/2016  . Bipolar disorder ([place])   . Cataract    B/L cataract  . Cellulitis 05/21/2014   right eye  . Chronic bronchitis ([place])    "get it q yr" (05/13/2013)  . Chronic pain   . Dehiscence of amputation stump ([place])   . Depression   . Edema of lower extremity   . Endocarditis 2002   subacute bacterial endocarditis.   . Family history of anesthesia complication    "my mom has a hard time coming out from under"  . Fibromyalgia   . GERD (gastroesophageal reflux disease)    occ  . Heart murmur   . Herpes simplex type 1 infection 01/16/2018  . History of blood transfusion    "just low blood count" (05/13/2013)  . Hyperlipidemia   . Hypertension   . Hypothyroidism   . Hypothyroidism, adult 03/21/2014  . Hypoxia   . Leukocytosis   . Necrosis ([place])    and ulceration  . Necrotizing fasciitis s/p OR debridements 07/06/2012  . Obesity   . OSA on CPAP    does not wear all the time  . Peripheral neuropathy   . Pneumonia   . Type II diabetes mellitus (HCC)    Type  II  . Wound dehiscence 09/04/2019   Review of Systems:  As per HPI.  Physical Exam:  Vitals:   02/13/20 1400  BP: 117/72  Pulse: 88  Temp: 98.3 F (36.8 C)  TempSrc: Oral  SpO2: 100%  Weight: 293 lb  3.2 oz (133 kg)  Height: 5' 9.5" (1.765 m)   General: Pleasant, sitting in wheelchair, no acute distress CV: Regular rate, rhythm. No murmurs, gallops, rubs. RLE distal pulses 2+ Pulm: Clear to auscultation bilaterally. No wheezing, rales. MSK: LLE amputation. Flat lesion with areas of hyper and hypopigmentation proximal to right knee.  Assessment & Plan:   See Encounters Tab for problem based charting.  Patient seen with Dr. Dareen Piano

## 2020-02-13 NOTE — Assessment & Plan Note (Signed)
Patient presenting today for diabetes follow-up. A1c today 8.5, unchanged from previous check. Mentions she is adherent to Victoza once per day. States she feels like she has been eating better, including increased intake of leafy vegetables. Endorses frustration as she has gained 30lbs over the last year, despite increasing exercise.  A/P: -Discussed further diet modifications. Believe patient has uncontrolled depression, which is complicating a healthier diet and lifestyle modification. -Will continue with Victoza 1.8mg  once per day -Adding Jardiance 10mg  once per day. Can increase as needed.

## 2020-02-14 NOTE — Progress Notes (Signed)
Internal Medicine Clinic Attending  I saw and evaluated the patient.  I personally confirmed the key portions of the history and exam documented by Dr. Braswell and I reviewed pertinent patient test results.  The assessment, diagnosis, and plan were formulated together and I agree with the documentation in the resident's note.  

## 2020-02-19 LAB — TOXASSURE SELECT,+ANTIDEPR,UR

## 2020-02-20 ENCOUNTER — Encounter (HOSPITAL_COMMUNITY): Payer: Self-pay | Admitting: Ophthalmology

## 2020-02-21 NOTE — Op Note (Signed)
[name] [name] 02/11/2020 Diagnosis: Cataract right eye  Procedure: Cataract extraction with intraocular lens implant right eye Operative Eye:  right eye  Surgeon: Royston Cowper Estimated Blood Loss: minimal Specimens for Pathology:  None Complications: none   The  patient was prepped and draped in the usual fashion for ocular surgery on the  right eye .  A lid speculum was placed.   After time out confirmed the correct operative eye as the right eye, a 5 mL peribulbar injection consisting of 2% Lidocaine with epinephrine and 0.5 cc Hylenex, akinesia and anesthesia were achieved with no evidence of retrobulbar hemorrhage.   A clear cornea paracentesis was fashioned at 2 o'clock.  Then using the keratome blade, a clear cornea incision was made in the superioriorly.  Viscoat was injected, and a continuous curvilinear capsulotomy was fashioned with a bent needle and Utrata forceps.  The anterior capsule flap was removed, and balanced salt solution was used to hydrodissect and hydrodelineate the nucleus.  The phacoemulsification handpiece was introduced, and removal of the nucleus was carried out without complication.  The residual cortical lens material was removed with the irrigation and aspiration handpiece.  The capsular bag was inspected and found to be intact. Provisc was again injected to fill the capsular bag.  Next, a posterior chamber intraocular lens implant was brought into the operative field and found to be free of defects, was rinsed, and then placed in the capsular bag with good fixation.  Positioning of the haptics was achieved using the Conner wand.  The viscoelastic was then removed using the irrigation and aspiration handpiece.  Balanced salt solution was injected to reform the anterior chamber to a normal tactile pressure.  The corneal wound was sutured with 10-0 nylon suture.  Subconjunctival injections of Ancef and Decadron were placed.    The speculum and drapes  were removed and the eye was patched with Polymixin/Bacitracin ophthalmic ointment. An eye shield was placed and the patient was transferred alert and conversant with stable vital signs to the post operative recovery area.  The patient tolerated the procedure well and no complications were noted.  Royston Cowper MD

## 2020-02-24 ENCOUNTER — Other Ambulatory Visit: Payer: Self-pay

## 2020-02-24 ENCOUNTER — Ambulatory Visit: Payer: Medicare HMO | Attending: Orthopedic Surgery | Admitting: Rehabilitation

## 2020-02-24 ENCOUNTER — Encounter: Payer: Self-pay | Admitting: Rehabilitation

## 2020-02-24 DIAGNOSIS — R296 Repeated falls: Secondary | ICD-10-CM | POA: Diagnosis not present

## 2020-02-24 DIAGNOSIS — R293 Abnormal posture: Secondary | ICD-10-CM | POA: Insufficient documentation

## 2020-02-24 DIAGNOSIS — M25652 Stiffness of left hip, not elsewhere classified: Secondary | ICD-10-CM | POA: Insufficient documentation

## 2020-02-24 DIAGNOSIS — R2681 Unsteadiness on feet: Secondary | ICD-10-CM | POA: Diagnosis not present

## 2020-02-24 DIAGNOSIS — R2689 Other abnormalities of gait and mobility: Secondary | ICD-10-CM | POA: Diagnosis not present

## 2020-02-24 NOTE — Therapy (Signed)
[facility] [address] [place], [place], [address] Phone: [phone]   Fax:  [phone]  Physical Therapy Treatment  Patient Details  Name: [name] [name] MRN: [id number] Date of Birth: [birth year] Referring Provider (PT): Meridee Score, MD   Encounter Date: 02/24/2020   PT End of Session - 02/24/20 1252    Visit Number 2    Number of Visits 17    Date for PT Re-Evaluation 04/12/20    Authorization Type [employer] and Medicaid (10th visit needed)    PT Start Time 1146    PT Stop Time 1232    PT Time Calculation (min) 46 min    Activity Tolerance Patient limited by pain;Treatment limited secondary to medical complications (Comment)    Behavior During Therapy [facility] for tasks assessed/performed           Past Medical History:  Diagnosis Date  . Abdominal muscle pain 09/08/2016  . Abnormal Pap smear of cervix 2009  . Abscess    history of multiple abscesses  . Acute bilateral low back pain 02/13/2017  . Acute blood loss anemia   . Acute on chronic renal failure ([place]) 07/12/2012  . Acute renal failure ([place]) 07/12/2012  . Adjustment disorder with depressed mood 03/26/2017  . AKI (acute kidney injury) ([place])   . Anemia of chronic disease 2002  . Anxiety    Panic attacks  . Bilateral lower extremity edema 05/13/2016  . Bipolar disorder ([place])   . Cataract    B/L cataract  . Cellulitis 05/21/2014   right eye  . Chronic bronchitis ([place])    "get it q yr" (05/13/2013)  . Chronic pain   . Dehiscence of amputation stump ([place])   . Depression   . Edema of lower extremity   . Endocarditis 2002   subacute bacterial endocarditis.   . Family history of anesthesia complication    "my mom has a hard time coming out from under"  . Fibromyalgia   . GERD (gastroesophageal reflux disease)    occ  . Heart murmur   . Herpes simplex type 1 infection 01/16/2018  . History of blood transfusion    "just low blood count"  (05/13/2013)  . Hyperlipidemia   . Hypertension   . Hypothyroidism   . Hypothyroidism, adult 03/21/2014  . Hypoxia   . Leukocytosis   . Necrosis ([place])    and ulceration  . Necrotizing fasciitis s/p OR debridements 07/06/2012  . Obesity   . OSA on CPAP    does not wear all the time  . Peripheral neuropathy   . Pneumonia   . Type II diabetes mellitus (HCC)    Type  II  . Wound dehiscence 09/04/2019    Past Surgical History:  Procedure Laterality Date  . ABDOMINAL AORTOGRAM W/LOWER EXTREMITY Left 03/19/2019   Procedure: ABDOMINAL AORTOGRAM W/LOWER EXTREMITY;  Surgeon: Serafina Mitchell, MD;  Location: [facility];  Service: Cardiovascular;  Laterality: Left;  . ABOVE KNEE LEG AMPUTATION Left 10/11/2019   wound dehisence   . AIR/FLUID EXCHANGE Right 12/31/2019   Procedure: AIR/FLUID EXCHANGE;  Surgeon: Jalene Mullet, MD;  Location: [facility];  Service: Ophthalmology;  Laterality: Right;  . AMPUTATION Left 04/07/2017   Procedure: LEFT BELOW KNEE AMPUTATION;  Surgeon: Newt Minion, MD;  Location: [facility];  Service: Orthopedics;  Laterality: Left;  . AMPUTATION Left 10/11/2019   Procedure: LEFT ABOVE KNEE AMPUTATION;  Surgeon: Newt Minion, MD;  Location: [facility];  Service: Orthopedics;  Laterality: Left;  . CATARACT EXTRACTION W/PHACO Right 02/11/2020   Procedure: CATARACT EXTRACTION PHACO AND INTRAOCULAR LENS PLACEMENT (IOC);  Surgeon: Jalene Mullet, MD;  Location: [facility];  Service: Ophthalmology;  Laterality: Right;  . CORONARY ARTERY BYPASS GRAFT    . EYE SURGERY     lazer  . GAS INSERTION Right 12/31/2019   Procedure: INSERTION OF GAS;  Surgeon: Jalene Mullet, MD;  Location: [facility];  Service: Ophthalmology;  Laterality: Right;  . INCISION AND DRAINAGE ABSCESS     multiple I&Ds  . INCISION AND DRAINAGE ABSCESS Left 07/09/2012   Procedure: DRESSING CHANGE, THIGH WOUND;  Surgeon: Harl Bowie, MD;  Location: [facility];  Service: General;  Laterality: Left;  . INCISION AND  DRAINAGE OF WOUND Left 07/07/2012   Procedure: IRRIGATION AND DEBRIDEMENT WOUND;  Surgeon: Harl Bowie, MD;  Location: [facility];  Service: General;  Laterality: Left;  . INCISION AND DRAINAGE PERIRECTAL ABSCESS Left 07/14/2012   Procedure: DEBRIDEMENT OF SKIN & SOFT TISSUE; DRESSING CHANGE UNDER ANESTHESIA;  Surgeon: Gayland Curry, MD,FACS;  Location: [facility];  Service: General;  Laterality: Left;  . INCISION AND DRAINAGE PERIRECTAL ABSCESS Left 07/16/2012   Procedure: I&D Left Thigh;  Surgeon: Gwenyth Ober, MD;  Location: [facility];  Service: General;  Laterality: Left;  . INCISION AND DRAINAGE PERIRECTAL ABSCESS N/A 01/05/2015   Procedure: IRRIGATION AND DEBRIDEMENT PERIRECTAL ABSCESS;  Surgeon: Donnie Mesa, MD;  Location: [facility];  Service: General;  Laterality: N/A;  . IRRIGATION AND DEBRIDEMENT ABSCESS Left 07/06/2012   Procedure: IRRIGATION AND DEBRIDEMENT ABSCESS BUTTOCKS AND THIGH;  Surgeon: Shann Medal, MD;  Location: [facility];  Service: General;  Laterality: Left;  . IRRIGATION AND DEBRIDEMENT ABSCESS Left 08/10/2012   Procedure: IRRIGATION AND DEBRIDEMENT ABSCESS;  Surgeon: Madilyn Hook, DO;  Location: [facility];  Service: General;  Laterality: Left;  . PARS PLANA VITRECTOMY Right 12/31/2019   Procedure: PARS PLANA VITRECTOMY WITH 25 GAUGE;  Surgeon: Jalene Mullet, MD;  Location: [facility];  Service: Ophthalmology;  Laterality: Right;  . PHOTOCOAGULATION WITH LASER Right 12/31/2019   Procedure: PHOTOCOAGULATION WITH LASER;  Surgeon: Jalene Mullet, MD;  Location: [facility];  Service: Ophthalmology;  Laterality: Right;  . REPAIR OF COMPLEX TRACTION RETINAL DETACHMENT Right 12/31/2019   Procedure: REPAIR OF COMPLEX TRACTION RETINAL;  Surgeon: Jalene Mullet, MD;  Location: [facility];  Service: Ophthalmology;  Laterality: Right;  . STUMP REVISION Left 06/09/2017   Procedure: REVISION LEFT BELOW KNEE AMPUTATION;  Surgeon: Newt Minion, MD;  Location: [facility];  Service: Orthopedics;  Laterality: Left;  . STUMP  REVISION Left 09/04/2019   Procedure: LEFT BELOW KNEE AMPUTATION REVISION;  Surgeon: Newt Minion, MD;  Location: [facility];  Service: Orthopedics;  Laterality: Left;  . STUMP REVISION Left 09/20/2019   Procedure: LEFT BELOW KNEE AMPUTATION REVISION;  Surgeon: Newt Minion, MD;  Location: [facility];  Service: Orthopedics;  Laterality: Left;  . TONSILLECTOMY  1994    There were no vitals filed for this visit.   Subjective Assessment - 02/24/20 1243    Subjective Pt did see PCP (resident) at internal med, however it did not seem as though they discussed her fluid retention.  Having increased R knee pain today.    Limitations Walking;House hold activities;Standing    Patient Stated Goals "I want to be able to walk on this leg and move around"    Currently in Pain? Yes    Pain Score 7  Pain Location Knee    Pain Orientation Right    Pain Descriptors / Indicators Aching    Pain Type Chronic pain    Pain Onset More than a month ago    Pain Frequency Constant    Aggravating Factors  WB    Pain Relieving Factors sitting, medication                             [facility] Adult PT Treatment/Exercise - 02/24/20 1224      Transfers   Transfers Sit to Stand;Stand to Sit    Sit to Stand 5: Supervision    Sit to Stand Details Verbal cues for sequencing;Verbal cues for technique;Verbal cues for precautions/safety    Stand to Sit 5: Supervision    Stand to Sit Details (indicate cue type and reason) Verbal cues for sequencing;Verbal cues for technique;Verbal cues for precautions/safety      Ambulation/Gait   Ambulation/Gait Yes    Ambulation/Gait Assistance 4: Min assist    Ambulation/Gait Assistance Details Pt able to get seated enough in socket today to ambulate in therapy gym with wide RW.  Pt ambulated x 150' at min A to S level with cues for posture, riding prosthesis all the way through stance phase to allow L knee to bend and swing through (some circumduction noted due to not  being seated fully when first ambulating).  About 32' into gait, she reports that she did not charge leg and so PT educated on how leg will go into safety mode when not charged.  Pt verbalized understanding and to charge when home.      Ambulation Distance (Feet) 150 Feet    Assistive device Rolling walker;Prosthesis    Gait Pattern Step-to pattern;Decreased stride length;Lateral hip instability;Lateral trunk lean to right;Trunk flexed;Wide base of support    Ambulation Surface Level;Indoor    Gait velocity 0.32 ft/sec with RW      Balance   Balance Assessed Yes      Dynamic Standing Balance   Dynamic Standing - Level of Assistance 5: Stand by assistance;4: Min assist    Dynamic Standing - Balance Activities Lateral lean/weight shifting;Forward lean/weight shifting;Reaching across midline;Turn head to look over shoulder    Lateral lean/weight shifting comments: Performed single UE reaching across body for lateral weight shift     Forward lean/weight shifting comments: forward/backward weight shift with UE support on RW    Reaching across midline comments: see above    Turn head to look over shoulder comments: Turning to look over shoulder for lateral weight shifting.  Perfomed without UE support       Prosthetics   Prosthetic Care Comments  Has tried to get into the leg everyday.  Did get it on well today earlier.   Educated to pull up to counter top for leverage to get prosthesis donned.  Could also get assist from family as needed.  Recommend she do standing with family (marching, forward stepping, etc) to get into leg.  If still not fully seated, have family remove valve and then do stepping/marching then replace valve to ambulate.  Also continue to educate on applying hand sanitizer when donning to allow limb to get better seated in socket prior to standing,      Current prosthetic wear tolerance (days/week)  daily, trying for 2 hours     Current prosthetic wear tolerance (#hours/day)  off  and on 2 hours per day  Current prosthetic weight-bearing tolerance (hours/day)  Recommend she start wearing leg 2 hours, 2x/day standing for 5 mins at a time with family S     Edema edema is better today     Education Provided Skin check;Residual limb care;Care of non-amputated limb;Proper Donning;Proper wear schedule/adjustment;Proper weight-bearing schedule/adjustment    Person(s) Educated Patient    Education Method Explanation;Demonstration;Verbal cues    Education Method Verbalized understanding;Needs further instruction    Donning Prosthesis Moderate assist                    PT Short Term Goals - 02/24/20 1254      PT SHORT TERM GOAL #1   Title Pt will be independent with initial standing HEP with prosthesis.   (Target Date: 03/13/20)    Time 4    Period Weeks    Status New    Target Date 03/13/20      PT SHORT TERM GOAL #2   Title Pt will be able to don prosthesis at S level and tolerate wearing prosthesis 5 hrs, 2x/day with no skin issues and with good understanding of swelling management.    Time 4    Period Weeks    Status New      PT SHORT TERM GOAL #3   Title Pt will perform forward reach 5", reach down to knee level, and perform dynamic tasks with intermittent UE support x 10 mins at S level.    Time 4    Period Weeks    Status Revised      PT SHORT TERM GOAL #4   Title Pt will ambulate x 100' with RW at S level in simulated household environment to indicate improved mobility at home.    Time 4    Period Weeks    Status Revised      PT SHORT TERM GOAL #5   Title Pt will improve gait speed to >/=.92 ft/sec w/ RW and prosthesis to indicate dec fall risk.    Time 4    Period Weeks    Status Revised             PT Long Term Goals - 02/24/20 1257      PT LONG TERM GOAL #1   Title Pt will be independent with final HEP in order to indicate improved functional mobility and balance with use of prosthesis.  (Target Date: 04/12/20)    Time 8     Period Weeks    Status New      PT LONG TERM GOAL #2   Title Pt will be able to don prosthesis at mod I level and tolerate wearing for 90% of awake hours without skin breakdown and with good understanding of swelling mangement.    Time 8    Period Weeks    Status New      PT LONG TERM GOAL #3   Title Pt will tolerate standing x 15 mins with intermittent UE support, 10" forward reach and ability to pick up item from floor with UE support at S level.    Time 8    Period Weeks    Status New      PT LONG TERM GOAL #4   Title Pt will improve gait speed to >/=1.52 ft/sec with LRAD and prosthesis to indicate dec fall risk.    Time 8    Period Weeks    Status Revised      PT LONG TERM GOAL #5   Title  Pt will ambulate 300' over unlevel outdoor surfaces with RW and prosthesis at S level in order to indicate improved community mobility.    Time 8    Period Weeks    Status Revised                 Plan - 02/24/20 1252    Clinical Impression Statement Skilled session focused on standing balance, getting seated in prosthesis and gait with RW.  Pt did very well today and was excited to be up walking.  Recommended she continue with 2 hours 2x/day with assist from family to stand/walk.    Personal Factors and Comorbidities Comorbidity 3+;Past/Current Experience    Comorbidities see above    Examination-Activity Limitations Bend;Carry;Dressing;Stand;Stairs;Squat;Locomotion Level;Transfers    Examination-Participation Restrictions Community Activity    Stability/Clinical Decision Making Evolving/Moderate complexity    Rehab Potential Good    PT Frequency 2x / week    PT Duration 8 weeks    PT Treatment/Interventions ADLs/Self Care Home Management;DME Instruction;Gait training;Stair training;Functional mobility training;Therapeutic activities;Therapeutic exercise;Balance training;Neuromuscular re-education;Patient/family education;Prosthetic Training;Compression bandaging;Passive range of  motion;Energy conservation    PT Next Visit Plan May have to start in standing with valve off to get into leg prior to standing and gait, give sink HEP, add balance as able, if able to tolerate wearing 2hours, 2x/day, then up to 3hrs, 2x/day    Consulted and Agree with Plan of Care Patient           Patient will benefit from skilled therapeutic intervention in order to improve the following deficits and impairments:  Abnormal gait, Decreased activity tolerance, Decreased balance, Decreased endurance, Decreased knowledge of precautions, Decreased knowledge of use of DME, Decreased mobility, Decreased range of motion, Decreased strength, Difficulty walking, Increased edema, Impaired perceived functional ability, Impaired flexibility, Impaired sensation, Postural dysfunction, Improper body mechanics, Prosthetic Dependency  Visit Diagnosis: Unsteadiness on feet  Other abnormalities of gait and mobility  Repeated falls  Hip stiffness, left  Abnormal posture     Problem List Patient Active Problem List   Diagnosis Date Noted  . Chronic pain syndrome 01/16/2020  . Hx of AKA (above knee amputation), left ([place])   . Symptomatic anemia 10/21/2019  . Anemia 10/21/2019  . Acute respiratory failure with hypoxia ([place]) 10/21/2019  . Blurry vision, right eye 10/08/2019  . Tobacco use 03/01/2019  . Phantom limb pain ([place]) 11/08/2018  . Current moderate episode of major depressive disorder ([place])   . Hyperkalemia   . Anemia of chronic disease   . Severe protein-calorie malnutrition ([place])   . Fall   . Status post below knee amputation, left ([place]) 04/11/2017  . Bipolar affective disorder ([place])   . Heart failure with preserved ejection fraction ([place]) 02/09/2017  . Routine adult health maintenance 12/08/2016  . Chronic pain of right knee 09/08/2016  . Chronic kidney disease (CKD), stage III (moderate) ([place]) 07/15/2016  . OSA (obstructive sleep apnea) 05/13/2016  . Morbid obesity due to excess  calories ([place]) 11/27/2015  . Hypothyroidism 04/25/2013  . Controlled type 2 diabetes mellitus ([place]) 07/12/2012  . Carpal tunnel syndrome, bilateral 01/25/2012  . Diabetic polyneuropathy associated with type 2 diabetes mellitus ([place]) 07/04/2011  . Anxiety and depression 06/02/2011  . GERD 12/06/2007  . Hyperlipidemia 08/29/2007  . Hypertension associated with diabetes ([place]) 08/29/2007    Cameron Sprang, PT, MPT [facility] [address] [place], [place], [address] Phone: [phone]   Fax:  [phone] 02/24/20, 12:59 PM  Name: Roxanna Mew  ADDALYN SPEEDY MRN: [id number] Date of Birth: 1976/03/14

## 2020-02-27 ENCOUNTER — Ambulatory Visit: Payer: Medicare HMO | Admitting: Rehabilitation

## 2020-02-27 ENCOUNTER — Other Ambulatory Visit: Payer: Self-pay | Admitting: Student

## 2020-02-27 ENCOUNTER — Other Ambulatory Visit: Payer: Self-pay

## 2020-02-27 ENCOUNTER — Encounter: Payer: Self-pay | Admitting: Rehabilitation

## 2020-02-27 DIAGNOSIS — R2689 Other abnormalities of gait and mobility: Secondary | ICD-10-CM | POA: Diagnosis not present

## 2020-02-27 DIAGNOSIS — R296 Repeated falls: Secondary | ICD-10-CM | POA: Diagnosis not present

## 2020-02-27 DIAGNOSIS — R2681 Unsteadiness on feet: Secondary | ICD-10-CM

## 2020-02-27 DIAGNOSIS — M25652 Stiffness of left hip, not elsewhere classified: Secondary | ICD-10-CM | POA: Diagnosis not present

## 2020-02-27 DIAGNOSIS — R293 Abnormal posture: Secondary | ICD-10-CM | POA: Diagnosis not present

## 2020-02-27 DIAGNOSIS — G894 Chronic pain syndrome: Secondary | ICD-10-CM

## 2020-02-27 MED ORDER — OXYCODONE-ACETAMINOPHEN 5-325 MG PO TABS: ORAL_TABLET | ORAL | 0 refills | Status: DC

## 2020-02-27 NOTE — Patient Instructions (Signed)

## 2020-02-27 NOTE — Telephone Encounter (Signed)
I will fill 1 month supply but she needs to follow up with her PCP in November to address discrepancies in her urine drug screen. Message sent to front desk to set up appointment.

## 2020-02-27 NOTE — Telephone Encounter (Signed)
Refill Request ° °oxyCODONE-acetaminophen (PERCOCET/ROXICET) 5-325 MG tablet ° ° °[facility] - [place], [place] - [address] °

## 2020-02-27 NOTE — Therapy (Signed)
[facility] [address] [place], [place], [address] Phone: [phone]   Fax:  [phone]  Physical Therapy Treatment  Patient Details  Name: LAYLIANA DEVINS MRN: [id number] Date of Birth: [birth year] Referring Provider (PT): Meridee Score, MD   Encounter Date: 02/27/2020   PT End of Session - 02/27/20 1037    Visit Number 3    Number of Visits 17    Date for PT Re-Evaluation 04/12/20    Authorization Type [employer] [place] and Medicaid (10th visit needed)    PT Start Time 0851    PT Stop Time 0930    PT Time Calculation (min) 39 min    Activity Tolerance Patient limited by pain;Treatment limited secondary to medical complications (Comment)    Behavior During Therapy [facility] for tasks assessed/performed           Past Medical History:  Diagnosis Date   Abdominal muscle pain 09/08/2016   Abnormal Pap smear of cervix 2009   Abscess    history of multiple abscesses   Acute bilateral low back pain 02/13/2017   Acute blood loss anemia    Acute on chronic renal failure ([place]) 07/12/2012   Acute renal failure ([place]) 07/12/2012   Adjustment disorder with depressed mood 03/26/2017   AKI (acute kidney injury) ([place])    Anemia of chronic disease 2002   Anxiety    Panic attacks   Bilateral lower extremity edema 05/13/2016   Bipolar disorder ([place])    Cataract    B/L cataract   Cellulitis 05/21/2014   right eye   Chronic bronchitis ([place])    "get it q yr" (05/13/2013)   Chronic pain    Dehiscence of amputation stump (HCC)    Depression    Edema of lower extremity    Endocarditis 2002   subacute bacterial endocarditis.    Family history of anesthesia complication    "my mom has a hard time coming out from under"   Fibromyalgia    GERD (gastroesophageal reflux disease)    occ   Heart murmur    Herpes simplex type 1 infection 01/16/2018   History of blood transfusion    "just low blood count"  (05/13/2013)   Hyperlipidemia    Hypertension    Hypothyroidism    Hypothyroidism, adult 03/21/2014   Hypoxia    Leukocytosis    Necrosis (HCC)    and ulceration   Necrotizing fasciitis s/p OR debridements 07/06/2012   Obesity    OSA on CPAP    does not wear all the time   Peripheral neuropathy    Pneumonia    Type II diabetes mellitus ([place])    Type  II   Wound dehiscence 09/04/2019    Past Surgical History:  Procedure Laterality Date   ABDOMINAL AORTOGRAM W/LOWER EXTREMITY Left 03/19/2019   Procedure: ABDOMINAL AORTOGRAM W/LOWER EXTREMITY;  Surgeon: Serafina Mitchell, MD;  Location: [facility];  Service: Cardiovascular;  Laterality: Left;   ABOVE KNEE LEG AMPUTATION Left 10/11/2019   wound dehisence    AIR/FLUID EXCHANGE Right 12/31/2019   Procedure: AIR/FLUID EXCHANGE;  Surgeon: Jalene Mullet, MD;  Location: [facility];  Service: Ophthalmology;  Laterality: Right;   AMPUTATION Left 04/07/2017   Procedure: LEFT BELOW KNEE AMPUTATION;  Surgeon: Newt Minion, MD;  Location: [facility];  Service: Orthopedics;  Laterality: Left;   AMPUTATION Left 10/11/2019   Procedure: LEFT ABOVE KNEE AMPUTATION;  Surgeon: Newt Minion, MD;  Location: [facility];  Service: Orthopedics;  Laterality: Left;   CATARACT EXTRACTION W/PHACO Right 02/11/2020   Procedure: CATARACT EXTRACTION PHACO AND INTRAOCULAR LENS PLACEMENT (IOC);  Surgeon: Jalene Mullet, MD;  Location: [facility];  Service: Ophthalmology;  Laterality: Right;   CORONARY ARTERY BYPASS GRAFT     EYE SURGERY     lazer   GAS INSERTION Right 12/31/2019   Procedure: INSERTION OF GAS;  Surgeon: Jalene Mullet, MD;  Location: [facility];  Service: Ophthalmology;  Laterality: Right;   INCISION AND DRAINAGE ABSCESS     multiple I&Ds   INCISION AND DRAINAGE ABSCESS Left 07/09/2012   Procedure: DRESSING CHANGE, THIGH WOUND;  Surgeon: Harl Bowie, MD;  Location: [facility];  Service: General;  Laterality: Left;   INCISION AND  DRAINAGE OF WOUND Left 07/07/2012   Procedure: IRRIGATION AND DEBRIDEMENT WOUND;  Surgeon: Harl Bowie, MD;  Location: [facility];  Service: General;  Laterality: Left;   INCISION AND DRAINAGE PERIRECTAL ABSCESS Left 07/14/2012   Procedure: DEBRIDEMENT OF SKIN & SOFT TISSUE; DRESSING CHANGE UNDER ANESTHESIA;  Surgeon: Gayland Curry, MD,FACS;  Location: [facility];  Service: General;  Laterality: Left;   INCISION AND DRAINAGE PERIRECTAL ABSCESS Left 07/16/2012   Procedure: I&D Left Thigh;  Surgeon: Gwenyth Ober, MD;  Location: [facility];  Service: General;  Laterality: Left;   INCISION AND DRAINAGE PERIRECTAL ABSCESS N/A 01/05/2015   Procedure: IRRIGATION AND DEBRIDEMENT PERIRECTAL ABSCESS;  Surgeon: Donnie Mesa, MD;  Location: [facility];  Service: General;  Laterality: N/A;   IRRIGATION AND DEBRIDEMENT ABSCESS Left 07/06/2012   Procedure: IRRIGATION AND DEBRIDEMENT ABSCESS BUTTOCKS AND THIGH;  Surgeon: Shann Medal, MD;  Location: [facility];  Service: General;  Laterality: Left;   IRRIGATION AND DEBRIDEMENT ABSCESS Left 08/10/2012   Procedure: IRRIGATION AND DEBRIDEMENT ABSCESS;  Surgeon: Madilyn Hook, DO;  Location: [facility];  Service: General;  Laterality: Left;   PARS PLANA VITRECTOMY Right 12/31/2019   Procedure: PARS PLANA VITRECTOMY WITH 25 GAUGE;  Surgeon: Jalene Mullet, MD;  Location: [facility];  Service: Ophthalmology;  Laterality: Right;   PHOTOCOAGULATION WITH LASER Right 12/31/2019   Procedure: PHOTOCOAGULATION WITH LASER;  Surgeon: Jalene Mullet, MD;  Location: [facility];  Service: Ophthalmology;  Laterality: Right;   REPAIR OF COMPLEX TRACTION RETINAL DETACHMENT Right 12/31/2019   Procedure: REPAIR OF COMPLEX TRACTION RETINAL;  Surgeon: Jalene Mullet, MD;  Location: [facility];  Service: Ophthalmology;  Laterality: Right;   STUMP REVISION Left 06/09/2017   Procedure: REVISION LEFT BELOW KNEE AMPUTATION;  Surgeon: Newt Minion, MD;  Location: [facility];  Service: Orthopedics;  Laterality: Left;   STUMP  REVISION Left 09/04/2019   Procedure: LEFT BELOW KNEE AMPUTATION REVISION;  Surgeon: Newt Minion, MD;  Location: [facility];  Service: Orthopedics;  Laterality: Left;   STUMP REVISION Left 09/20/2019   Procedure: LEFT BELOW KNEE AMPUTATION REVISION;  Surgeon: Newt Minion, MD;  Location: [facility];  Service: Orthopedics;  Laterality: Left;   TONSILLECTOMY  1994    There were no vitals filed for this visit.   Subjective Assessment - 02/27/20 0856    Subjective Pt present with prosthesis donned today.  Has increased R knee pain over past couple of days.    Limitations Walking;House hold activities;Standing    Patient Stated Goals "I want to be able to walk on this leg and move around"    Currently in Pain? Yes    Pain Score 9     Pain Location Knee    Pain  Orientation Right    Pain Descriptors / Indicators Sharp    Pain Type Chronic pain    Pain Onset More than a month ago    Pain Frequency Constant    Aggravating Factors  WB    Pain Relieving Factors heat, medication                             [facility] Adult PT Treatment/Exercise - 02/27/20 0855      Transfers   Transfers Sit to Stand;Stand to Sit    Sit to Stand 5: Supervision    Stand to Sit 4: Min guard    Stand to Sit Details (indicate cue type and reason) Verbal cues for sequencing;Verbal cues for technique;Verbal cues for precautions/safety    Comments Cues for equal WB during stand to sit to allow knee to bend, however knee seems to have too much tension set and she has to [facility] for it to bend.,       Ambulation/Gait   Ambulation/Gait Yes    Ambulation/Gait Assistance 4: Min guard    Ambulation/Gait Assistance Details Pt able to ambulate x 115' today with RW and prosthesis.  Cues for upright posture and intermittent forward gaze.  Cues also for "riding" prosthesis through stance phase of gait to allow knee flexion and improved swing phase of gait.  However it seems that knee may be set with too much tension  (likely for  safety) and pt has to circumduct LE to clear.  Also note some IR of prosthesis which she is able to correct with cues so likely a hip weakness issue.     Ambulation Distance (Feet) 115 Feet   then another 89' x 2    Assistive device Rolling walker;Prosthesis    Gait Pattern Step-to pattern;Decreased stride length;Lateral hip instability;Lateral trunk lean to right;Trunk flexed;Wide base of support    Ambulation Surface Level;Indoor    Gait Comments Pt called [facility] during session and made appt for 10/14 at 1130 following PT session that day to have knee component adjusted.        High Level Balance   High Level Balance Comments Performed sink HEP for weight shifting and balance.  See pt instruction for details. Performed all x 10 reps.       Prosthetics   Current prosthetic wear tolerance (days/week)  She continues to try and wear daily for 2 hours, 2x/day  since last session.     Current prosthetic wear tolerance (#hours/day)  working into 2 hours, 2xday     Current prosthetic weight-bearing tolerance (hours/day)  Continue with 5-6 mins of WB through this week.     Residual limb condition  Pt reports no skin issues    Education Provided Proper wear schedule/adjustment;Proper weight-bearing schedule/adjustment    Person(s) Educated Patient    Education Method Explanation    Education Method Verbalized understanding;Needs further instruction    Donning Prosthesis Supervision              Do each exercise 2  times per day Do each exercise 10 repetitions Hold each exercise for 5 seconds to feel your location  AT [place].  USE TAPE ON FLOOR TO MARK THE MIDLINE POSITION. You also should try to feel with your limb pressure in socket.  You are trying to feel with limb what you used to feel with the bottom of your foot.  1. Side to Side Shift: Moving your hips only (not shoulders): move weight onto your  left leg, HOLD/FEEL.  Move back to equal weight on each leg, HOLD/FEEL. Move weight onto your right leg, HOLD/FEEL. Move back to equal weight on each leg, HOLD/FEEL. Repeat. 2. Front to Back Shift: Moving your hips only (not shoulders): move your weight forward onto your toes, HOLD/FEEL. Move your weight back to equal Flat Foot on both legs, HOLD/FEEL. Move your weight back onto your heels, HOLD/FEEL. Move your weight back to equal on both legs, HOLD/FEEL. Repeat. 3. Moving Cones / Cups: With equal weight on each leg: Hold on with one hand the first time, then progress to no hand supports. Move cups from one side of sink to the other. Place cups ~2 out of your reach, progress to 10 beyond reach. 4. Overhead/Upward Reaching: alternated reaching up to top cabinets or ceiling if no cabinets present. Keep equal weight on each leg. Start with one hand support on counter while other hand reaches and progress to no hand support with reaching. 5.   Looking Over Shoulders: With equal weight on each leg: alternate turning to look over your shoulders with one hand support on counter as needed. Shift weight to             side looking, pull hip then shoulder then head/eyes around to look behind you. Start with one hand support & progress to no hand support.       PT Short Term Goals - 02/24/20 1254      PT SHORT TERM GOAL #1   Title Pt will be independent with initial standing HEP with prosthesis.   (Target Date: 03/13/20)    Time 4    Period Weeks    Status New    Target Date 03/13/20      PT SHORT TERM GOAL #2   Title Pt will be able to don prosthesis at S level and tolerate wearing prosthesis 5 hrs, 2x/day with no skin issues and with good understanding of swelling management.    Time 4    Period Weeks    Status New      PT SHORT TERM GOAL #3   Title Pt will perform forward reach 5", reach down to knee level, and perform dynamic tasks with intermittent UE support x 10 mins at S level.    Time 4      Period Weeks    Status Revised      PT SHORT TERM GOAL #4   Title Pt will ambulate x 100' with RW at S level in simulated household environment to indicate improved mobility at home.    Time 4    Period Weeks    Status Revised      PT SHORT TERM GOAL #5   Title Pt will improve gait speed to >/=.92 ft/sec w/ RW and prosthesis to indicate dec fall risk.    Time 4    Period Weeks    Status Revised             PT Long Term Goals - 02/24/20 1257      PT LONG TERM GOAL #1   Title Pt will be independent with final HEP in order to indicate improved functional mobility and balance with use of prosthesis.  (Target Date: 04/12/20)    Time 8    Period Weeks    Status New      PT LONG TERM GOAL #2   Title Pt  will be able to don prosthesis at mod I level and tolerate wearing for 90% of awake hours without skin breakdown and with good understanding of swelling mangement.    Time 8    Period Weeks    Status New      PT LONG TERM GOAL #3   Title Pt will tolerate standing x 15 mins with intermittent UE support, 10" forward reach and ability to pick up item from floor with UE support at S level.    Time 8    Period Weeks    Status New      PT LONG TERM GOAL #4   Title Pt will improve gait speed to >/=1.52 ft/sec with LRAD and prosthesis to indicate dec fall risk.    Time 8    Period Weeks    Status Revised      PT LONG TERM GOAL #5   Title Pt will ambulate 300' over unlevel outdoor surfaces with RW and prosthesis at S level in order to indicate improved community mobility.    Time 8    Period Weeks    Status Revised                 Plan - 02/27/20 1038    Clinical Impression Statement Skilled session focused on continuing to gait train with RW and prosthesis.  She had charged leg today and due to increased tension set at knee (likely for safety), she is unable to naturally flex knee during terminal stance/swing phase of gait and has to circumduct.  Made appt for pt next  week at [facility] to adjust.  Also initiated sink HEP.  Pt tolerated well however with increased R knee pain and some back pain.    Personal Factors and Comorbidities Comorbidity 3+;Past/Current Experience    Comorbidities see above    Examination-Activity Limitations Bend;Carry;Dressing;Stand;Stairs;Squat;Locomotion Level;Transfers    Examination-Participation Restrictions Community Activity    Stability/Clinical Decision Making Evolving/Moderate complexity    Rehab Potential Good    PT Frequency 2x / week    PT Duration 8 weeks    PT Treatment/Interventions ADLs/Self Care Home Management;DME Instruction;Gait training;Stair training;Functional mobility training;Therapeutic activities;Therapeutic exercise;Balance training;Neuromuscular re-education;Patient/family education;Prosthetic Training;Compression bandaging;Passive range of motion;Energy conservation    PT Next Visit Plan See how sink HEP is going, dynamic balance with decreasing UE support, standing strengthening (if unable to tolerate due to knee pain, give supine strengthening),  if able to tolerate wearing 2hours, 2x/day, then up to 3hrs, 2x/day    Recommended Other Services has appt [facility] 10/14    Consulted and Agree with Plan of Care Patient           Patient will benefit from skilled therapeutic intervention in order to improve the following deficits and impairments:  Abnormal gait, Decreased activity tolerance, Decreased balance, Decreased endurance, Decreased knowledge of precautions, Decreased knowledge of use of DME, Decreased mobility, Decreased range of motion, Decreased strength, Difficulty walking, Increased edema, Impaired perceived functional ability, Impaired flexibility, Impaired sensation, Postural dysfunction, Improper body mechanics, Prosthetic Dependency  Visit Diagnosis: Unsteadiness on feet  Other abnormalities of gait and mobility  Repeated falls  Abnormal posture     Problem List Patient Active  Problem List   Diagnosis Date Noted   Chronic pain syndrome 01/16/2020   Hx of AKA (above knee amputation), left (HCC)    Symptomatic anemia 10/21/2019   Anemia 10/21/2019   Acute respiratory failure with hypoxia ([place]) 10/21/2019   Blurry vision, right eye 10/08/2019  Tobacco use 03/01/2019   Phantom limb pain ([place]) 11/08/2018   Current moderate episode of major depressive disorder (HCC)    Hyperkalemia    Anemia of chronic disease    Severe protein-calorie malnutrition ([place])    Fall    Status post below knee amputation, left ([place]) 04/11/2017   Bipolar affective disorder ([place])    Heart failure with preserved ejection fraction ([place]) 02/09/2017   Routine adult health maintenance 12/08/2016   Chronic pain of right knee 09/08/2016   Chronic kidney disease (CKD), stage III (moderate) ([place]) 07/15/2016   OSA (obstructive sleep apnea) 05/13/2016   Morbid obesity due to excess calories ([place]) 11/27/2015   Hypothyroidism 04/25/2013   Controlled type 2 diabetes mellitus ([place]) 07/12/2012   Carpal tunnel syndrome, bilateral 01/25/2012   Diabetic polyneuropathy associated with type 2 diabetes mellitus ([place]) 07/04/2011   Anxiety and depression 06/02/2011   GERD 12/06/2007   Hyperlipidemia 08/29/2007   Hypertension associated with diabetes ([place]) 08/29/2007    Cameron Sprang, PT, MPT [facility] [address] [place], [place], [address] Phone: [phone]   Fax:  [phone] 02/27/20, 10:41 AM  Name: ARLYCE CIRCLE MRN: [id number] Date of Birth: 02/24/1976

## 2020-03-02 ENCOUNTER — Encounter: Payer: Self-pay | Admitting: Rehabilitation

## 2020-03-02 ENCOUNTER — Ambulatory Visit: Payer: Medicare HMO | Admitting: Rehabilitation

## 2020-03-02 ENCOUNTER — Other Ambulatory Visit: Payer: Self-pay

## 2020-03-02 DIAGNOSIS — R296 Repeated falls: Secondary | ICD-10-CM

## 2020-03-02 DIAGNOSIS — R293 Abnormal posture: Secondary | ICD-10-CM | POA: Diagnosis not present

## 2020-03-02 DIAGNOSIS — R2681 Unsteadiness on feet: Secondary | ICD-10-CM | POA: Diagnosis not present

## 2020-03-02 DIAGNOSIS — R2689 Other abnormalities of gait and mobility: Secondary | ICD-10-CM | POA: Diagnosis not present

## 2020-03-02 DIAGNOSIS — M25652 Stiffness of left hip, not elsewhere classified: Secondary | ICD-10-CM | POA: Diagnosis not present

## 2020-03-02 NOTE — Therapy (Signed)
[facility] [address] [place], [place], [address] Phone: [phone]   Fax:  [phone]  Physical Therapy Treatment  Patient Details  Name: [name] [name] MRN: [id number] Date of Birth: 05-20-1976 Referring Provider (PT): Meridee Score, MD   Encounter Date: 03/02/2020   PT End of Session - 03/02/20 1154    Visit Number 4    Number of Visits 17    Date for PT Re-Evaluation 04/12/20    Authorization Type [employer] [place] and Medicaid (10th visit needed)    PT Start Time 1148    PT Stop Time 1231    PT Time Calculation (min) 43 min    Activity Tolerance Patient limited by pain;Treatment limited secondary to medical complications (Comment)    Behavior During Therapy [facility] for tasks assessed/performed           Past Medical History:  Diagnosis Date  . Abdominal muscle pain 09/08/2016  . Abnormal Pap smear of cervix 2009  . Abscess    history of multiple abscesses  . Acute bilateral low back pain 02/13/2017  . Acute blood loss anemia   . Acute on chronic renal failure ([place]) 07/12/2012  . Acute renal failure ([place]) 07/12/2012  . Adjustment disorder with depressed mood 03/26/2017  . AKI (acute kidney injury) ([place])   . Anemia of chronic disease 2002  . Anxiety    Panic attacks  . Bilateral lower extremity edema 05/13/2016  . Bipolar disorder ([place])   . Cataract    B/L cataract  . Cellulitis 05/21/2014   right eye  . Chronic bronchitis ([place])    "get it q yr" (05/13/2013)  . Chronic pain   . Dehiscence of amputation stump ([place])   . Depression   . Edema of lower extremity   . Endocarditis 2002   subacute bacterial endocarditis.   . Family history of anesthesia complication    "my mom has a hard time coming out from under"  . Fibromyalgia   . GERD (gastroesophageal reflux disease)    occ  . Heart murmur   . Herpes simplex type 1 infection 01/16/2018  . History of blood transfusion    "just low blood count"  (05/13/2013)  . Hyperlipidemia   . Hypertension   . Hypothyroidism   . Hypothyroidism, adult 03/21/2014  . Hypoxia   . Leukocytosis   . Necrosis ([place])    and ulceration  . Necrotizing fasciitis s/p OR debridements 07/06/2012  . Obesity   . OSA on CPAP    does not wear all the time  . Peripheral neuropathy   . Pneumonia   . Type II diabetes mellitus (HCC)    Type  II  . Wound dehiscence 09/04/2019    Past Surgical History:  Procedure Laterality Date  . ABDOMINAL AORTOGRAM W/LOWER EXTREMITY Left 03/19/2019   Procedure: ABDOMINAL AORTOGRAM W/LOWER EXTREMITY;  Surgeon: Serafina Mitchell, MD;  Location: [facility];  Service: Cardiovascular;  Laterality: Left;  . ABOVE KNEE LEG AMPUTATION Left 10/11/2019   wound dehisence   . AIR/FLUID EXCHANGE Right 12/31/2019   Procedure: AIR/FLUID EXCHANGE;  Surgeon: Jalene Mullet, MD;  Location: [facility];  Service: Ophthalmology;  Laterality: Right;  . AMPUTATION Left 04/07/2017   Procedure: LEFT BELOW KNEE AMPUTATION;  Surgeon: Newt Minion, MD;  Location: [facility];  Service: Orthopedics;  Laterality: Left;  . AMPUTATION Left 10/11/2019   Procedure: LEFT ABOVE KNEE AMPUTATION;  Surgeon: Newt Minion, MD;  Location: [facility];  Service: Orthopedics;  Laterality: Left;  . CATARACT EXTRACTION W/PHACO Right 02/11/2020   Procedure: CATARACT EXTRACTION PHACO AND INTRAOCULAR LENS PLACEMENT (IOC);  Surgeon: Jalene Mullet, MD;  Location: [facility];  Service: Ophthalmology;  Laterality: Right;  . CORONARY ARTERY BYPASS GRAFT    . EYE SURGERY     lazer  . GAS INSERTION Right 12/31/2019   Procedure: INSERTION OF GAS;  Surgeon: Jalene Mullet, MD;  Location: [facility];  Service: Ophthalmology;  Laterality: Right;  . INCISION AND DRAINAGE ABSCESS     multiple I&Ds  . INCISION AND DRAINAGE ABSCESS Left 07/09/2012   Procedure: DRESSING CHANGE, THIGH WOUND;  Surgeon: Harl Bowie, MD;  Location: [facility];  Service: General;  Laterality: Left;  . INCISION AND  DRAINAGE OF WOUND Left 07/07/2012   Procedure: IRRIGATION AND DEBRIDEMENT WOUND;  Surgeon: Harl Bowie, MD;  Location: [facility];  Service: General;  Laterality: Left;  . INCISION AND DRAINAGE PERIRECTAL ABSCESS Left 07/14/2012   Procedure: DEBRIDEMENT OF SKIN & SOFT TISSUE; DRESSING CHANGE UNDER ANESTHESIA;  Surgeon: Gayland Curry, MD,FACS;  Location: [facility];  Service: General;  Laterality: Left;  . INCISION AND DRAINAGE PERIRECTAL ABSCESS Left 07/16/2012   Procedure: I&D Left Thigh;  Surgeon: Gwenyth Ober, MD;  Location: [facility];  Service: General;  Laterality: Left;  . INCISION AND DRAINAGE PERIRECTAL ABSCESS N/A 01/05/2015   Procedure: IRRIGATION AND DEBRIDEMENT PERIRECTAL ABSCESS;  Surgeon: Donnie Mesa, MD;  Location: [facility];  Service: General;  Laterality: N/A;  . IRRIGATION AND DEBRIDEMENT ABSCESS Left 07/06/2012   Procedure: IRRIGATION AND DEBRIDEMENT ABSCESS BUTTOCKS AND THIGH;  Surgeon: Shann Medal, MD;  Location: [facility];  Service: General;  Laterality: Left;  . IRRIGATION AND DEBRIDEMENT ABSCESS Left 08/10/2012   Procedure: IRRIGATION AND DEBRIDEMENT ABSCESS;  Surgeon: Madilyn Hook, DO;  Location: [facility];  Service: General;  Laterality: Left;  . PARS PLANA VITRECTOMY Right 12/31/2019   Procedure: PARS PLANA VITRECTOMY WITH 25 GAUGE;  Surgeon: Jalene Mullet, MD;  Location: [facility];  Service: Ophthalmology;  Laterality: Right;  . PHOTOCOAGULATION WITH LASER Right 12/31/2019   Procedure: PHOTOCOAGULATION WITH LASER;  Surgeon: Jalene Mullet, MD;  Location: [facility];  Service: Ophthalmology;  Laterality: Right;  . REPAIR OF COMPLEX TRACTION RETINAL DETACHMENT Right 12/31/2019   Procedure: REPAIR OF COMPLEX TRACTION RETINAL;  Surgeon: Jalene Mullet, MD;  Location: [facility];  Service: Ophthalmology;  Laterality: Right;  . STUMP REVISION Left 06/09/2017   Procedure: REVISION LEFT BELOW KNEE AMPUTATION;  Surgeon: Newt Minion, MD;  Location: [facility];  Service: Orthopedics;  Laterality: Left;  . STUMP  REVISION Left 09/04/2019   Procedure: LEFT BELOW KNEE AMPUTATION REVISION;  Surgeon: Newt Minion, MD;  Location: [facility];  Service: Orthopedics;  Laterality: Left;  . STUMP REVISION Left 09/20/2019   Procedure: LEFT BELOW KNEE AMPUTATION REVISION;  Surgeon: Newt Minion, MD;  Location: [facility];  Service: Orthopedics;  Laterality: Left;  . TONSILLECTOMY  1994    There were no vitals filed for this visit.   Subjective Assessment - 03/02/20 1152    Subjective Pt reports not feeling well, but okay to do therapy.    Limitations Walking;House hold activities;Standing    Patient Stated Goals "I want to be able to walk on this leg and move around"    Currently in Pain? Yes    Pain Score 7     Pain Location Knee    Pain Orientation Right    Pain Descriptors /  Indicators Sharp    Pain Type Chronic pain    Pain Onset More than a month ago    Pain Frequency Constant    Aggravating Factors  WB    Pain Relieving Factors heat, medication                             [facility] Adult PT Treatment/Exercise - 03/02/20 1150      Transfers   Transfers Sit to Stand;Stand to Sit    Sit to Stand 5: Supervision    Sit to Stand Details Verbal cues for sequencing;Verbal cues for technique;Verbal cues for precautions/safety    Stand to Sit 4: Min assist    Stand to Sit Details (indicate cue type and reason) Verbal cues for sequencing;Verbal cues for technique;Verbal cues for precautions/safety    Comments Performed sit<>stand in // bars to small stool in order to work on equal WB to allow L prosthetic knee to bend.  Attempted x 5 reps with PT providing facilitation for increase L lateral weight shift with no success on getting knee to bend.        Ambulation/Gait   Ambulation/Gait Yes    Ambulation/Gait Assistance 4: Min guard;4: Min assist    Ambulation/Gait Assistance Details Min A at times only to better facilitate improved L lateral and forward weight shift to allow L knee to bend.  PT  unsuccessful in getting knee to bend.  PT did place heel wedge under R foot to hopefully allow better clearance of LLE during swing phase of gait.      Ambulation Distance (Feet) 125 Feet    Assistive device Rolling walker;Prosthesis    Gait Pattern Step-to pattern;Decreased stride length;Lateral hip instability;Lateral trunk lean to right;Trunk flexed;Wide base of support    Ambulation Surface Level;Indoor    Pre-Gait Activities Set up ramp steps (4" step with incline on either side) to work on forward progression over L prosthesis to allow knee to bend.  This was successful on several occasions with PT providing assist at pelvis for forward weight shift over prosthesis.  Performed x 10 reps.     Gait Comments Performed step ups/down in // bars to 4" step with BUE support x 8 reps with cues for sequencing and technique.       High Level Balance   High Level Balance Activities Backward walking;Side stepping    High Level Balance Comments Worked on side stepping and backwards walking in // bars with cues for improved weight shift and L residual limb activation when moving RLE.  Also had pt step over small barrier with RLE for improved L lateral weight shift and weight bearing x 10 reps followed by side stepping over small barrier (yard stick due to pt unable to bring LEs very close together) x 10 reps.  Cues for progressing from BUE support to single UE support       Prosthetics   Current prosthetic wear tolerance (days/week)  Tolerating 2hrs, 2x/day     Current prosthetic wear tolerance (#hours/day)  2 hours, 2xday     Current prosthetic weight-bearing tolerance (hours/day)  5-8 mins of WB    Residual limb condition  Pt reports no skin issues    Education Provided Care of non-amputated limb    Person(s) Educated Patient    Education Method Explanation    Education Method Verbalized understanding;Needs further instruction    Donning Prosthesis Modified independent (device/increased time)  PT Short Term Goals - 02/24/20 1254      PT SHORT TERM GOAL #1   Title Pt will be independent with initial standing HEP with prosthesis.   (Target Date: 03/13/20)    Time 4    Period Weeks    Status New    Target Date 03/13/20      PT SHORT TERM GOAL #2   Title Pt will be able to don prosthesis at S level and tolerate wearing prosthesis 5 hrs, 2x/day with no skin issues and with good understanding of swelling management.    Time 4    Period Weeks    Status New      PT SHORT TERM GOAL #3   Title Pt will perform forward reach 5", reach down to knee level, and perform dynamic tasks with intermittent UE support x 10 mins at S level.    Time 4    Period Weeks    Status Revised      PT SHORT TERM GOAL #4   Title Pt will ambulate x 100' with RW at S level in simulated household environment to indicate improved mobility at home.    Time 4    Period Weeks    Status Revised      PT SHORT TERM GOAL #5   Title Pt will improve gait speed to >/=.92 ft/sec w/ RW and prosthesis to indicate dec fall risk.    Time 4    Period Weeks    Status Revised             PT Long Term Goals - 02/24/20 1257      PT LONG TERM GOAL #1   Title Pt will be independent with final HEP in order to indicate improved functional mobility and balance with use of prosthesis.  (Target Date: 04/12/20)    Time 8    Period Weeks    Status New      PT LONG TERM GOAL #2   Title Pt will be able to don prosthesis at mod I level and tolerate wearing for 90% of awake hours without skin breakdown and with good understanding of swelling mangement.    Time 8    Period Weeks    Status New      PT LONG TERM GOAL #3   Title Pt will tolerate standing x 15 mins with intermittent UE support, 10" forward reach and ability to pick up item from floor with UE support at S level.    Time 8    Period Weeks    Status New      PT LONG TERM GOAL #4   Title Pt will improve gait speed to >/=1.52 ft/sec  with LRAD and prosthesis to indicate dec fall risk.    Time 8    Period Weeks    Status Revised      PT LONG TERM GOAL #5   Title Pt will ambulate 300' over unlevel outdoor surfaces with RW and prosthesis at S level in order to indicate improved community mobility.    Time 8    Period Weeks    Status Revised                 Plan - 03/02/20 1241    Clinical Impression Statement Skilled session focused on gait with RW and prosthesis with heel wedge under R foot to allow better clearance of LLE during swing phase of gait.  This did help somewhat however feel that she is  limited until knee is able to flex more.  Tried several tasks in // bars to force knee flexion and was able to get knee to flex with ramp stepping however unable to get with any other tasks.  Remainder of session focused on standing balance and L lateral weight shift.    Personal Factors and Comorbidities Comorbidity 3+;Past/Current Experience    Comorbidities see above    Examination-Activity Limitations Bend;Carry;Dressing;Stand;Stairs;Squat;Locomotion Level;Transfers    Examination-Participation Restrictions Community Activity    Stability/Clinical Decision Making Evolving/Moderate complexity    Rehab Potential Good    PT Frequency 2x / week    PT Duration 8 weeks    PT Treatment/Interventions ADLs/Self Care Home Management;DME Instruction;Gait training;Stair training;Functional mobility training;Therapeutic activities;Therapeutic exercise;Balance training;Neuromuscular re-education;Patient/family education;Prosthetic Training;Compression bandaging;Passive range of motion;Energy conservation    PT Next Visit Plan Continue with standing balance until knee is adjusted, dynamic balance with decreasing UE support, standing strengthening (if unable to tolerate due to knee pain, give supine strengthening),  if able to tolerate wearing 2hours, 2x/day, then up to 3hrs, 2x/day    Consulted and Agree with Plan of Care Patient            Patient will benefit from skilled therapeutic intervention in order to improve the following deficits and impairments:  Abnormal gait, Decreased activity tolerance, Decreased balance, Decreased endurance, Decreased knowledge of precautions, Decreased knowledge of use of DME, Decreased mobility, Decreased range of motion, Decreased strength, Difficulty walking, Increased edema, Impaired perceived functional ability, Impaired flexibility, Impaired sensation, Postural dysfunction, Improper body mechanics, Prosthetic Dependency  Visit Diagnosis: Unsteadiness on feet  Other abnormalities of gait and mobility  Repeated falls  Abnormal posture     Problem List Patient Active Problem List   Diagnosis Date Noted  . Chronic pain syndrome 01/16/2020  . Hx of AKA (above knee amputation), left ([place])   . Symptomatic anemia 10/21/2019  . Anemia 10/21/2019  . Acute respiratory failure with hypoxia ([place]) 10/21/2019  . Blurry vision, right eye 10/08/2019  . Tobacco use 03/01/2019  . Phantom limb pain ([place]) 11/08/2018  . Current moderate episode of major depressive disorder ([place])   . Hyperkalemia   . Anemia of chronic disease   . Severe protein-calorie malnutrition ([place])   . Fall   . Status post below knee amputation, left ([place]) 04/11/2017  . Bipolar affective disorder ([place])   . Heart failure with preserved ejection fraction ([place]) 02/09/2017  . Routine adult health maintenance 12/08/2016  . Chronic pain of right knee 09/08/2016  . Chronic kidney disease (CKD), stage III (moderate) ([place]) 07/15/2016  . OSA (obstructive sleep apnea) 05/13/2016  . Morbid obesity due to excess calories ([place]) 11/27/2015  . Hypothyroidism 04/25/2013  . Controlled type 2 diabetes mellitus ([place]) 07/12/2012  . Carpal tunnel syndrome, bilateral 01/25/2012  . Diabetic polyneuropathy associated with type 2 diabetes mellitus ([place]) 07/04/2011  . Anxiety and depression 06/02/2011  . GERD 12/06/2007  .  Hyperlipidemia 08/29/2007  . Hypertension associated with diabetes ([place]) 08/29/2007    Cameron Sprang, PT, MPT [facility] [address] [place], [place], [address] Phone: [phone]   Fax:  [phone] 03/02/20, 12:53 PM  Name: [name] [name] MRN: [id number] Date of Birth: [birth year]

## 2020-03-05 ENCOUNTER — Ambulatory Visit: Payer: Medicare HMO | Admitting: Rehabilitation

## 2020-03-05 ENCOUNTER — Encounter: Payer: Self-pay | Admitting: Rehabilitation

## 2020-03-05 ENCOUNTER — Other Ambulatory Visit: Payer: Self-pay

## 2020-03-05 DIAGNOSIS — R293 Abnormal posture: Secondary | ICD-10-CM

## 2020-03-05 DIAGNOSIS — R2681 Unsteadiness on feet: Secondary | ICD-10-CM

## 2020-03-05 DIAGNOSIS — R2689 Other abnormalities of gait and mobility: Secondary | ICD-10-CM | POA: Diagnosis not present

## 2020-03-05 DIAGNOSIS — R296 Repeated falls: Secondary | ICD-10-CM | POA: Diagnosis not present

## 2020-03-05 DIAGNOSIS — M25652 Stiffness of left hip, not elsewhere classified: Secondary | ICD-10-CM | POA: Diagnosis not present

## 2020-03-05 NOTE — Therapy (Signed)
[facility] [address] [place], [place], [address] Phone: [phone]   Fax:  [phone]  Physical Therapy Treatment  Patient Details  Name: MARKETTA VALADEZ MRN: [id number] Date of Birth: Jan 03, 1976 Referring Provider (PT): Meridee Score, MD   Encounter Date: 03/05/2020   PT End of Session - 03/05/20 1322    Visit Number 5    Number of Visits 17    Date for PT Re-Evaluation 04/12/20    Authorization Type [employer] [place] and Medicaid (10th visit needed)    PT Start Time 0935    PT Stop Time 1017    PT Time Calculation (min) 42 min    Activity Tolerance Patient limited by pain;Treatment limited secondary to medical complications (Comment)    Behavior During Therapy [facility] for tasks assessed/performed           Past Medical History:  Diagnosis Date   Abdominal muscle pain 09/08/2016   Abnormal Pap smear of cervix 2009   Abscess    history of multiple abscesses   Acute bilateral low back pain 02/13/2017   Acute blood loss anemia    Acute on chronic renal failure ([place]) 07/12/2012   Acute renal failure ([place]) 07/12/2012   Adjustment disorder with depressed mood 03/26/2017   AKI (acute kidney injury) ([place])    Anemia of chronic disease 2002   Anxiety    Panic attacks   Bilateral lower extremity edema 05/13/2016   Bipolar disorder ([place])    Cataract    B/L cataract   Cellulitis 05/21/2014   right eye   Chronic bronchitis ([place])    "get it q yr" (05/13/2013)   Chronic pain    Dehiscence of amputation stump (HCC)    Depression    Edema of lower extremity    Endocarditis 2002   subacute bacterial endocarditis.    Family history of anesthesia complication    "my mom has a hard time coming out from under"   Fibromyalgia    GERD (gastroesophageal reflux disease)    occ   Heart murmur    Herpes simplex type 1 infection 01/16/2018   History of blood transfusion    "just low blood count"  (05/13/2013)   Hyperlipidemia    Hypertension    Hypothyroidism    Hypothyroidism, adult 03/21/2014   Hypoxia    Leukocytosis    Necrosis (HCC)    and ulceration   Necrotizing fasciitis s/p OR debridements 07/06/2012   Obesity    OSA on CPAP    does not wear all the time   Peripheral neuropathy    Pneumonia    Type II diabetes mellitus ([place])    Type  II   Wound dehiscence 09/04/2019    Past Surgical History:  Procedure Laterality Date   ABDOMINAL AORTOGRAM W/LOWER EXTREMITY Left 03/19/2019   Procedure: ABDOMINAL AORTOGRAM W/LOWER EXTREMITY;  Surgeon: Serafina Mitchell, MD;  Location: [facility];  Service: Cardiovascular;  Laterality: Left;   ABOVE KNEE LEG AMPUTATION Left 10/11/2019   wound dehisence    AIR/FLUID EXCHANGE Right 12/31/2019   Procedure: AIR/FLUID EXCHANGE;  Surgeon: Jalene Mullet, MD;  Location: [facility];  Service: Ophthalmology;  Laterality: Right;   AMPUTATION Left 04/07/2017   Procedure: LEFT BELOW KNEE AMPUTATION;  Surgeon: Newt Minion, MD;  Location: [facility];  Service: Orthopedics;  Laterality: Left;   AMPUTATION Left 10/11/2019   Procedure: LEFT ABOVE KNEE AMPUTATION;  Surgeon: Newt Minion, MD;  Location: [facility];  Service: Orthopedics;  Laterality: Left;   CATARACT EXTRACTION W/PHACO Right 02/11/2020   Procedure: CATARACT EXTRACTION PHACO AND INTRAOCULAR LENS PLACEMENT (IOC);  Surgeon: Jalene Mullet, MD;  Location: [facility];  Service: Ophthalmology;  Laterality: Right;   CORONARY ARTERY BYPASS GRAFT     EYE SURGERY     lazer   GAS INSERTION Right 12/31/2019   Procedure: INSERTION OF GAS;  Surgeon: Jalene Mullet, MD;  Location: [facility];  Service: Ophthalmology;  Laterality: Right;   INCISION AND DRAINAGE ABSCESS     multiple I&Ds   INCISION AND DRAINAGE ABSCESS Left 07/09/2012   Procedure: DRESSING CHANGE, THIGH WOUND;  Surgeon: Harl Bowie, MD;  Location: [facility];  Service: General;  Laterality: Left;   INCISION AND  DRAINAGE OF WOUND Left 07/07/2012   Procedure: IRRIGATION AND DEBRIDEMENT WOUND;  Surgeon: Harl Bowie, MD;  Location: [facility];  Service: General;  Laterality: Left;   INCISION AND DRAINAGE PERIRECTAL ABSCESS Left 07/14/2012   Procedure: DEBRIDEMENT OF SKIN & SOFT TISSUE; DRESSING CHANGE UNDER ANESTHESIA;  Surgeon: Gayland Curry, MD,FACS;  Location: [facility];  Service: General;  Laterality: Left;   INCISION AND DRAINAGE PERIRECTAL ABSCESS Left 07/16/2012   Procedure: I&D Left Thigh;  Surgeon: Gwenyth Ober, MD;  Location: [facility];  Service: General;  Laterality: Left;   INCISION AND DRAINAGE PERIRECTAL ABSCESS N/A 01/05/2015   Procedure: IRRIGATION AND DEBRIDEMENT PERIRECTAL ABSCESS;  Surgeon: Donnie Mesa, MD;  Location: [place];  Service: General;  Laterality: N/A;   IRRIGATION AND DEBRIDEMENT ABSCESS Left 07/06/2012   Procedure: IRRIGATION AND DEBRIDEMENT ABSCESS BUTTOCKS AND THIGH;  Surgeon: Shann Medal, MD;  Location: [facility];  Service: General;  Laterality: Left;   IRRIGATION AND DEBRIDEMENT ABSCESS Left 08/10/2012   Procedure: IRRIGATION AND DEBRIDEMENT ABSCESS;  Surgeon: Madilyn Hook, DO;  Location: [facility];  Service: General;  Laterality: Left;   PARS PLANA VITRECTOMY Right 12/31/2019   Procedure: PARS PLANA VITRECTOMY WITH 25 GAUGE;  Surgeon: Jalene Mullet, MD;  Location: [facility];  Service: Ophthalmology;  Laterality: Right;   PHOTOCOAGULATION WITH LASER Right 12/31/2019   Procedure: PHOTOCOAGULATION WITH LASER;  Surgeon: Jalene Mullet, MD;  Location: [facility];  Service: Ophthalmology;  Laterality: Right;   REPAIR OF COMPLEX TRACTION RETINAL DETACHMENT Right 12/31/2019   Procedure: REPAIR OF COMPLEX TRACTION RETINAL;  Surgeon: Jalene Mullet, MD;  Location: [facility];  Service: Ophthalmology;  Laterality: Right;   STUMP REVISION Left 06/09/2017   Procedure: REVISION LEFT BELOW KNEE AMPUTATION;  Surgeon: Newt Minion, MD;  Location: [facility];  Service: Orthopedics;  Laterality: Left;   STUMP  REVISION Left 09/04/2019   Procedure: LEFT BELOW KNEE AMPUTATION REVISION;  Surgeon: Newt Minion, MD;  Location: [facility];  Service: Orthopedics;  Laterality: Left;   STUMP REVISION Left 09/20/2019   Procedure: LEFT BELOW KNEE AMPUTATION REVISION;  Surgeon: Newt Minion, MD;  Location: [facility] [place];  Service: Orthopedics;  Laterality: Left;   TONSILLECTOMY  1994    There were no vitals filed for this visit.   Subjective Assessment - 03/05/20 1003    Subjective Pt reports not being able to get into leg due to falling asleep with legs hanging down in chair last night.    Limitations Walking;House hold activities;Standing    Currently in Pain? Yes    Pain Score 8     Pain Location Back    Pain Orientation Lower    Pain Descriptors / Indicators Aching    Pain Type  Chronic pain    Pain Onset More than a month ago    Pain Frequency Intermittent    Aggravating Factors  Standing for long periods    Pain Relieving Factors heat/medication                             [facility] Adult PT Treatment/Exercise - 03/05/20 1004      Transfers   Transfers Sit to Stand;Stand to Sit    Sit to Stand 5: Supervision;4: Min guard    Sit to Stand Details Verbal cues for sequencing;Verbal cues for technique;Verbal cues for precautions/safety    Stand to Sit 5: Supervision;4: Min guard    Stand to Sit Details (indicate cue type and reason) Verbal cues for sequencing;Verbal cues for technique;Verbal cues for precautions/safety    Comments Provided min/guard due to not being all the way in prosthesis.        Ambulation/Gait   Ambulation/Gait Yes    Ambulation/Gait Assistance 4: Min guard    Ambulation/Gait Assistance Details Min/guard for safety but gait to attempt to get pt fully seated in prosthesis.  Was able to get pt all but approx 1" into socket and feel that if she does some more standing at [facility], she will likely get the rest of the way into socket.  Cues for posture but note she does have  to circumduct to clear LLE due to not fully being seated in socket.     Ambulation Distance (Feet) 100 Feet    Assistive device Rolling walker;Prosthesis    Gait Pattern Step-to pattern;Decreased stride length;Lateral hip instability;Lateral trunk lean to right;Trunk flexed;Wide base of support      Dynamic Standing Balance   Dynamic Standing - Level of Assistance 5: Stand by assistance    Dynamic Standing - Balance Activities Lateral lean/weight shifting;Forward lean/weight shifting;Reaching for objects;Reaching across midline    Lateral lean/weight shifting comments: Performed lateral weight shifts with upward reaching to targets     Forward lean/weight shifting comments: Forward weight shifts for forward stepping of RLE    Reaching across midline comments: Reaching across midline to emphasize L lateral weight shift       Prosthetics   Prosthetic Care Comments  Education on importance of getting into bed each night or at least falling asleep with legs elevated as she was too swollen to get into prosthesis this morning.     Current prosthetic wear tolerance (days/week)  Tolerating 2hrs, 2x/day     Current prosthetic wear tolerance (#hours/day)  2 hours, 2xday     Current prosthetic weight-bearing tolerance (hours/day)  5-8 mins of WB    Edema Marked edema today    Residual limb condition  Pt reports no skin issues    Education Provided Proper Donning;Skin check;Residual limb care;Care of non-amputated limb    Person(s) Educated Patient    Education Method Explanation;Verbal cues    Education Method Needs further instruction;Verbalized understanding    Donning Prosthesis Moderate assist                    PT Short Term Goals - 02/24/20 1254      PT SHORT TERM GOAL #1   Title Pt will be independent with initial standing HEP with prosthesis.   (Target Date: 03/13/20)    Time 4    Period Weeks    Status New    Target Date 03/13/20      PT SHORT TERM  GOAL #2   Title Pt will  be able to don prosthesis at S level and tolerate wearing prosthesis 5 hrs, 2x/day with no skin issues and with good understanding of swelling management.    Time 4    Period Weeks    Status New      PT SHORT TERM GOAL #3   Title Pt will perform forward reach 5", reach down to knee level, and perform dynamic tasks with intermittent UE support x 10 mins at S level.    Time 4    Period Weeks    Status Revised      PT SHORT TERM GOAL #4   Title Pt will ambulate x 100' with RW at S level in simulated household environment to indicate improved mobility at home.    Time 4    Period Weeks    Status Revised      PT SHORT TERM GOAL #5   Title Pt will improve gait speed to >/=.92 ft/sec w/ RW and prosthesis to indicate dec fall risk.    Time 4    Period Weeks    Status Revised             PT Long Term Goals - 02/24/20 1257      PT LONG TERM GOAL #1   Title Pt will be independent with final HEP in order to indicate improved functional mobility and balance with use of prosthesis.  (Target Date: 04/12/20)    Time 8    Period Weeks    Status New      PT LONG TERM GOAL #2   Title Pt will be able to don prosthesis at mod I level and tolerate wearing for 90% of awake hours without skin breakdown and with good understanding of swelling mangement.    Time 8    Period Weeks    Status New      PT LONG TERM GOAL #3   Title Pt will tolerate standing x 15 mins with intermittent UE support, 10" forward reach and ability to pick up item from floor with UE support at S level.    Time 8    Period Weeks    Status New      PT LONG TERM GOAL #4   Title Pt will improve gait speed to >/=1.52 ft/sec with LRAD and prosthesis to indicate dec fall risk.    Time 8    Period Weeks    Status Revised      PT LONG TERM GOAL #5   Title Pt will ambulate 300' over unlevel outdoor surfaces with RW and prosthesis at S level in order to indicate improved community mobility.    Time 8    Period Weeks     Status Revised                 Plan - 03/05/20 1325    Clinical Impression Statement Skilled session focused on standing balance and weight shifting in order to get pt properly seated fully in prosthesis.  We were able to get mostly into leg and then ambulate x 100' to get all but approx 1" into prosthesis.  Pt to go to [facility] today for adjustmnet.    Personal Factors and Comorbidities Comorbidity 3+;Past/Current Experience    Comorbidities see above    Examination-Activity Limitations Bend;Carry;Dressing;Stand;Stairs;Squat;Locomotion Level;Transfers    Examination-Participation Restrictions Community Activity    Stability/Clinical Decision Making Evolving/Moderate complexity    Rehab Potential Good    PT  Frequency 2x / week    PT Duration 8 weeks    PT Treatment/Interventions ADLs/Self Care Home Management;DME Instruction;Gait training;Stair training;Functional mobility training;Therapeutic activities;Therapeutic exercise;Balance training;Neuromuscular re-education;Patient/family education;Prosthetic Training;Compression bandaging;Passive range of motion;Energy conservation    PT Next Visit Plan Continue with standing balance until knee is adjusted, sit<>stand, gait with knee flexion-having her ride prosthesis all the way over stance, dynamic balance with decreasing UE support, standing strengthening (if unable to tolerate due to knee pain, give supine strengthening),  if able to tolerate wearing 2hours, 2x/day, then up to 3hrs, 2x/day    Consulted and Agree with Plan of Care Patient           Patient will benefit from skilled therapeutic intervention in order to improve the following deficits and impairments:  Abnormal gait, Decreased activity tolerance, Decreased balance, Decreased endurance, Decreased knowledge of precautions, Decreased knowledge of use of DME, Decreased mobility, Decreased range of motion, Decreased strength, Difficulty walking, Increased edema, Impaired  perceived functional ability, Impaired flexibility, Impaired sensation, Postural dysfunction, Improper body mechanics, Prosthetic Dependency  Visit Diagnosis: Unsteadiness on feet  Other abnormalities of gait and mobility  Abnormal posture     Problem List Patient Active Problem List   Diagnosis Date Noted   Chronic pain syndrome 01/16/2020   Hx of AKA (above knee amputation), left (HCC)    Symptomatic anemia 10/21/2019   Anemia 10/21/2019   Acute respiratory failure with hypoxia (HCC) 10/21/2019   Blurry vision, right eye 10/08/2019   Tobacco use 03/01/2019   Phantom limb pain (HCC) 11/08/2018   Current moderate episode of major depressive disorder (HCC)    Hyperkalemia    Anemia of chronic disease    Severe protein-calorie malnutrition ([place])    Fall    Status post below knee amputation, left ([place]) 04/11/2017   Bipolar affective disorder ([place])    Heart failure with preserved ejection fraction ([place]) 02/09/2017   Routine adult health maintenance 12/08/2016   Chronic pain of right knee 09/08/2016   Chronic kidney disease (CKD), stage III (moderate) (HCC) 07/15/2016   OSA (obstructive sleep apnea) 05/13/2016   Morbid obesity due to excess calories ([place]) 11/27/2015   Hypothyroidism 04/25/2013   Controlled type 2 diabetes mellitus ([place]) 07/12/2012   Carpal tunnel syndrome, bilateral 01/25/2012   Diabetic polyneuropathy associated with type 2 diabetes mellitus ([place]) 07/04/2011   Anxiety and depression 06/02/2011   GERD 12/06/2007   Hyperlipidemia 08/29/2007   Hypertension associated with diabetes ([place]) 08/29/2007    Cameron Sprang, PT, MPT [facility] [address] [place], [place], [address] Phone: [phone]   Fax:  [phone] 03/05/20, 1:28 PM  Name: MARYGRACE SANDOVAL MRN: [id number] Date of Birth: September 14, 1975

## 2020-03-09 ENCOUNTER — Ambulatory Visit: Payer: Medicare HMO | Admitting: Rehabilitation

## 2020-03-12 ENCOUNTER — Other Ambulatory Visit: Payer: Self-pay

## 2020-03-12 ENCOUNTER — Ambulatory Visit: Payer: Medicare HMO | Admitting: Rehabilitation

## 2020-03-12 ENCOUNTER — Encounter: Payer: Self-pay | Admitting: Rehabilitation

## 2020-03-12 DIAGNOSIS — R296 Repeated falls: Secondary | ICD-10-CM

## 2020-03-12 DIAGNOSIS — R2689 Other abnormalities of gait and mobility: Secondary | ICD-10-CM

## 2020-03-12 DIAGNOSIS — M25652 Stiffness of left hip, not elsewhere classified: Secondary | ICD-10-CM | POA: Diagnosis not present

## 2020-03-12 DIAGNOSIS — R2681 Unsteadiness on feet: Secondary | ICD-10-CM | POA: Diagnosis not present

## 2020-03-12 DIAGNOSIS — R293 Abnormal posture: Secondary | ICD-10-CM | POA: Diagnosis not present

## 2020-03-12 NOTE — Therapy (Signed)
[facility] [address] [place], [place], [address] Phone: [phone]   Fax:  [phone]  Physical Therapy Treatment  Patient Details  Name: [name] [name] MRN: [id number] Date of Birth: Jun 30, 1975 Referring Provider (PT): Meridee Score, MD   Encounter Date: 03/12/2020   PT End of Session - 03/12/20 1454    Visit Number 6    Number of Visits 17    Date for PT Re-Evaluation 04/12/20    Authorization Type [employer] [place] and Medicaid (10th visit needed)    Progress Note Due on Visit 10    PT Start Time 1152    PT Stop Time 1233    PT Time Calculation (min) 41 min    Activity Tolerance Patient limited by pain;Treatment limited secondary to medical complications (Comment)    Behavior During Therapy [facility] for tasks assessed/performed           Past Medical History:  Diagnosis Date  . Abdominal muscle pain 09/08/2016  . Abnormal Pap smear of cervix 2009  . Abscess    history of multiple abscesses  . Acute bilateral low back pain 02/13/2017  . Acute blood loss anemia   . Acute on chronic renal failure ([place]) 07/12/2012  . Acute renal failure ([place]) 07/12/2012  . Adjustment disorder with depressed mood 03/26/2017  . AKI (acute kidney injury) ([place])   . Anemia of chronic disease 2002  . Anxiety    Panic attacks  . Bilateral lower extremity edema 05/13/2016  . Bipolar disorder ([place])   . Cataract    B/L cataract  . Cellulitis 05/21/2014   right eye  . Chronic bronchitis ([place])    "get it q yr" (05/13/2013)  . Chronic pain   . Dehiscence of amputation stump ([place])   . Depression   . Edema of lower extremity   . Endocarditis 2002   subacute bacterial endocarditis.   . Family history of anesthesia complication    "my mom has a hard time coming out from under"  . Fibromyalgia   . GERD (gastroesophageal reflux disease)    occ  . Heart murmur   . Herpes simplex type 1 infection 01/16/2018  . History of blood  transfusion    "just low blood count" (05/13/2013)  . Hyperlipidemia   . Hypertension   . Hypothyroidism   . Hypothyroidism, adult 03/21/2014  . Hypoxia   . Leukocytosis   . Necrosis ([place])    and ulceration  . Necrotizing fasciitis s/p OR debridements 07/06/2012  . Obesity   . OSA on CPAP    does not wear all the time  . Peripheral neuropathy   . Pneumonia   . Type II diabetes mellitus (HCC)    Type  II  . Wound dehiscence 09/04/2019    Past Surgical History:  Procedure Laterality Date  . ABDOMINAL AORTOGRAM W/LOWER EXTREMITY Left 03/19/2019   Procedure: ABDOMINAL AORTOGRAM W/LOWER EXTREMITY;  Surgeon: Serafina Mitchell, MD;  Location: [facility];  Service: Cardiovascular;  Laterality: Left;  . ABOVE KNEE LEG AMPUTATION Left 10/11/2019   wound dehisence   . AIR/FLUID EXCHANGE Right 12/31/2019   Procedure: AIR/FLUID EXCHANGE;  Surgeon: Jalene Mullet, MD;  Location: [facility];  Service: Ophthalmology;  Laterality: Right;  . AMPUTATION Left 04/07/2017   Procedure: LEFT BELOW KNEE AMPUTATION;  Surgeon: Newt Minion, MD;  Location: [facility];  Service: Orthopedics;  Laterality: Left;  . AMPUTATION Left 10/11/2019   Procedure: LEFT ABOVE KNEE AMPUTATION;  Surgeon:  Newt Minion, MD;  Location: [facility];  Service: Orthopedics;  Laterality: Left;  . CATARACT EXTRACTION W/PHACO Right 02/11/2020   Procedure: CATARACT EXTRACTION PHACO AND INTRAOCULAR LENS PLACEMENT (IOC);  Surgeon: Jalene Mullet, MD;  Location: [facility];  Service: Ophthalmology;  Laterality: Right;  . CORONARY ARTERY BYPASS GRAFT    . EYE SURGERY     lazer  . GAS INSERTION Right 12/31/2019   Procedure: INSERTION OF GAS;  Surgeon: Jalene Mullet, MD;  Location: [facility];  Service: Ophthalmology;  Laterality: Right;  . INCISION AND DRAINAGE ABSCESS     multiple I&Ds  . INCISION AND DRAINAGE ABSCESS Left 07/09/2012   Procedure: DRESSING CHANGE, THIGH WOUND;  Surgeon: Harl Bowie, MD;  Location: [facility];  Service: General;   Laterality: Left;  . INCISION AND DRAINAGE OF WOUND Left 07/07/2012   Procedure: IRRIGATION AND DEBRIDEMENT WOUND;  Surgeon: Harl Bowie, MD;  Location: [facility];  Service: General;  Laterality: Left;  . INCISION AND DRAINAGE PERIRECTAL ABSCESS Left 07/14/2012   Procedure: DEBRIDEMENT OF SKIN & SOFT TISSUE; DRESSING CHANGE UNDER ANESTHESIA;  Surgeon: Gayland Curry, MD,FACS;  Location: [facility];  Service: General;  Laterality: Left;  . INCISION AND DRAINAGE PERIRECTAL ABSCESS Left 07/16/2012   Procedure: I&D Left Thigh;  Surgeon: Gwenyth Ober, MD;  Location: [facility];  Service: General;  Laterality: Left;  . INCISION AND DRAINAGE PERIRECTAL ABSCESS N/A 01/05/2015   Procedure: IRRIGATION AND DEBRIDEMENT PERIRECTAL ABSCESS;  Surgeon: Donnie Mesa, MD;  Location: [facility];  Service: General;  Laterality: N/A;  . IRRIGATION AND DEBRIDEMENT ABSCESS Left 07/06/2012   Procedure: IRRIGATION AND DEBRIDEMENT ABSCESS BUTTOCKS AND THIGH;  Surgeon: Shann Medal, MD;  Location: [facility];  Service: General;  Laterality: Left;  . IRRIGATION AND DEBRIDEMENT ABSCESS Left 08/10/2012   Procedure: IRRIGATION AND DEBRIDEMENT ABSCESS;  Surgeon: Madilyn Hook, DO;  Location: [facility];  Service: General;  Laterality: Left;  . PARS PLANA VITRECTOMY Right 12/31/2019   Procedure: PARS PLANA VITRECTOMY WITH 25 GAUGE;  Surgeon: Jalene Mullet, MD;  Location: [facility];  Service: Ophthalmology;  Laterality: Right;  . PHOTOCOAGULATION WITH LASER Right 12/31/2019   Procedure: PHOTOCOAGULATION WITH LASER;  Surgeon: Jalene Mullet, MD;  Location: [facility];  Service: Ophthalmology;  Laterality: Right;  . REPAIR OF COMPLEX TRACTION RETINAL DETACHMENT Right 12/31/2019   Procedure: REPAIR OF COMPLEX TRACTION RETINAL;  Surgeon: Jalene Mullet, MD;  Location: [facility];  Service: Ophthalmology;  Laterality: Right;  . STUMP REVISION Left 06/09/2017   Procedure: REVISION LEFT BELOW KNEE AMPUTATION;  Surgeon: Newt Minion, MD;  Location: [facility];  Service:  Orthopedics;  Laterality: Left;  . STUMP REVISION Left 09/04/2019   Procedure: LEFT BELOW KNEE AMPUTATION REVISION;  Surgeon: Newt Minion, MD;  Location: [facility];  Service: Orthopedics;  Laterality: Left;  . STUMP REVISION Left 09/20/2019   Procedure: LEFT BELOW KNEE AMPUTATION REVISION;  Surgeon: Newt Minion, MD;  Location: [facility];  Service: Orthopedics;  Laterality: Left;  . TONSILLECTOMY  1994    There were no vitals filed for this visit.   Subjective Assessment - 03/12/20 1315    Subjective Pt reports getting knee adjusted and height adjusted at [facility].    Limitations Walking;House hold activities;Standing    Currently in Pain? Yes    Pain Score 4     Pain Location Back    Pain Orientation Lower    Pain Descriptors / Indicators Aching    Pain Type Chronic pain  Pain Onset More than a month ago    Pain Frequency Intermittent    Aggravating Factors  standing for long periods    Pain Relieving Factors heat/medication                             [facility] Adult PT Treatment/Exercise - 03/12/20 1303      Transfers   Transfers Sit to Stand;Stand to Sit    Sit to Stand 5: Supervision    Sit to Stand Details Verbal cues for sequencing;Verbal cues for technique;Verbal cues for precautions/safety    Stand to Sit 5: Supervision    Stand to Sit Details (indicate cue type and reason) Verbal cues for sequencing;Verbal cues for technique;Verbal cues for precautions/safety    Comments Pt better able to bend knee when sitting, however still needs cues for equal WB       Ambulation/Gait   Ambulation/Gait Yes    Ambulation/Gait Assistance 4: Min guard    Ambulation/Gait Assistance Details Worked on gait with her personal RW (she ambulated into clinic today-note that it is regular RW whereas she really needs a wide RW) to encourage riding prosthesis all the way through stance phase of gait in order to have L knee flex at end of terminal stance.  She continues to have  difficulty advancing prosthesis and can only do so with circumduction and IR.  Note that she does not get enough hip extension when trying to advance LLE, however some of this is due to RW being too small and not able to get close enough to RW during gait.  Did transition to outside of // bars to just work on advancing RLE and shifting forward to try and get knee to flex, however this still did not work very well.  Then had to set up 4" step with small ramp steps on either side to have her place L foot on ramp and "ride leg" that way in order to get knee to flex.  PT providing cues for upright posture and maintaining WB as when she off loads leg , knee tends to buckle quickly.  Pt did try placing heel wedge back in right shoe and this still did not assist with clearance.  PT to call Gerald Stabs and see if he can come to session vs making an appt at [facility].      Ambulation Distance (Feet) 100 Feet   x 2 and then in bars several reps    Assistive device Rolling walker;Prosthesis    Gait Pattern Step-to pattern;Decreased stride length;Lateral hip instability;Lateral trunk lean to right;Trunk flexed;Wide base of support    Ambulation Surface Level;Indoor      Prosthetics   Prosthetic Care Comments  Educated to wear prosthesis 2 hours, 2x/day as she is only wearing 2 hours per whole day.     Current prosthetic wear tolerance (days/week)  2hours daily    Current prosthetic wear tolerance (#hours/day)  2 hours daily     Current prosthetic weight-bearing tolerance (hours/day)  5-8 mins of WB    Residual limb condition  Pt reports no skin issues    Education Provided Proper Donning;Skin check;Residual limb care;Care of non-amputated limb;Proper wear schedule/adjustment    Person(s) Educated Patient    Education Method Explanation;Verbal cues    Education Method Verbalized understanding;Needs further instruction    Donning Prosthesis Modified independent (device/increased time)  PT  Short Term Goals - 02/24/20 1254      PT SHORT TERM GOAL #1   Title Pt will be independent with initial standing HEP with prosthesis.   (Target Date: 03/13/20)    Time 4    Period Weeks    Status New    Target Date 03/13/20      PT SHORT TERM GOAL #2   Title Pt will be able to don prosthesis at S level and tolerate wearing prosthesis 5 hrs, 2x/day with no skin issues and with good understanding of swelling management.    Time 4    Period Weeks    Status New      PT SHORT TERM GOAL #3   Title Pt will perform forward reach 5", reach down to knee level, and perform dynamic tasks with intermittent UE support x 10 mins at S level.    Time 4    Period Weeks    Status Revised      PT SHORT TERM GOAL #4   Title Pt will ambulate x 100' with RW at S level in simulated household environment to indicate improved mobility at home.    Time 4    Period Weeks    Status Revised      PT SHORT TERM GOAL #5   Title Pt will improve gait speed to >/=.92 ft/sec w/ RW and prosthesis to indicate dec fall risk.    Time 4    Period Weeks    Status Revised             PT Long Term Goals - 02/24/20 1257      PT LONG TERM GOAL #1   Title Pt will be independent with final HEP in order to indicate improved functional mobility and balance with use of prosthesis.  (Target Date: 04/12/20)    Time 8    Period Weeks    Status New      PT LONG TERM GOAL #2   Title Pt will be able to don prosthesis at mod I level and tolerate wearing for 90% of awake hours without skin breakdown and with good understanding of swelling mangement.    Time 8    Period Weeks    Status New      PT LONG TERM GOAL #3   Title Pt will tolerate standing x 15 mins with intermittent UE support, 10" forward reach and ability to pick up item from floor with UE support at S level.    Time 8    Period Weeks    Status New      PT LONG TERM GOAL #4   Title Pt will improve gait speed to >/=1.52 ft/sec with LRAD and prosthesis to  indicate dec fall risk.    Time 8    Period Weeks    Status Revised      PT LONG TERM GOAL #5   Title Pt will ambulate 300' over unlevel outdoor surfaces with RW and prosthesis at S level in order to indicate improved community mobility.    Time 8    Period Weeks    Status Revised                 Plan - 03/12/20 1455    Clinical Impression Statement Skilled session focused on gait and standing pregait activities to achieve L prosthetic knee flexion.  She is still unable to fully forward weight shift with enough hip extension to allow knee to flex and  when she does (or PT provides assist to flex) she is unable to clear with forward hip flex but rather using circumduction, trunk rotation, and IR at hip.  Will contact [facility] to problem solve with Gerald Stabs and have him come to session.    Personal Factors and Comorbidities Comorbidity 3+;Past/Current Experience    Comorbidities see above    Examination-Activity Limitations Bend;Carry;Dressing;Stand;Stairs;Squat;Locomotion Level;Transfers    Examination-Participation Restrictions Community Activity    Stability/Clinical Decision Making Evolving/Moderate complexity    Rehab Potential Good    PT Frequency 2x / week    PT Duration 8 weeks    PT Treatment/Interventions ADLs/Self Care Home Management;DME Instruction;Gait training;Stair training;Functional mobility training;Therapeutic activities;Therapeutic exercise;Balance training;Neuromuscular re-education;Patient/family education;Prosthetic Training;Compression bandaging;Passive range of motion;Energy conservation    PT Next Visit Plan Continue with standing balance until knee is adjusted, sit<>stand, gait with knee flexion-having her ride prosthesis all the way over stance, dynamic balance with decreasing UE support, standing strengthening (if unable to tolerate due to knee pain, give supine strengthening),  if able to tolerate wearing 2hours, 2x/day, then up to 3hrs, 2x/day    Consulted and  Agree with Plan of Care Patient           Patient will benefit from skilled therapeutic intervention in order to improve the following deficits and impairments:  Abnormal gait, Decreased activity tolerance, Decreased balance, Decreased endurance, Decreased knowledge of precautions, Decreased knowledge of use of DME, Decreased mobility, Decreased range of motion, Decreased strength, Difficulty walking, Increased edema, Impaired perceived functional ability, Impaired flexibility, Impaired sensation, Postural dysfunction, Improper body mechanics, Prosthetic Dependency  Visit Diagnosis: Unsteadiness on feet  Other abnormalities of gait and mobility  Abnormal posture  Repeated falls     Problem List Patient Active Problem List   Diagnosis Date Noted  . Chronic pain syndrome 01/16/2020  . Hx of AKA (above knee amputation), left ([place])   . Symptomatic anemia 10/21/2019  . Anemia 10/21/2019  . Acute respiratory failure with hypoxia ([place]) 10/21/2019  . Blurry vision, right eye 10/08/2019  . Tobacco use 03/01/2019  . Phantom limb pain ([place]) 11/08/2018  . Current moderate episode of major depressive disorder ([place])   . Hyperkalemia   . Anemia of chronic disease   . Severe protein-calorie malnutrition ([place])   . Fall   . Status post below knee amputation, left ([place]) 04/11/2017  . Bipolar affective disorder ([place])   . Heart failure with preserved ejection fraction ([place]) 02/09/2017  . Routine adult health maintenance 12/08/2016  . Chronic pain of right knee 09/08/2016  . Chronic kidney disease (CKD), stage III (moderate) ([place]) 07/15/2016  . OSA (obstructive sleep apnea) 05/13/2016  . Morbid obesity due to excess calories ([place]) 11/27/2015  . Hypothyroidism 04/25/2013  . Controlled type 2 diabetes mellitus ([place]) 07/12/2012  . Carpal tunnel syndrome, bilateral 01/25/2012  . Diabetic polyneuropathy associated with type 2 diabetes mellitus ([place]) 07/04/2011  . Anxiety and depression  06/02/2011  . GERD 12/06/2007  . Hyperlipidemia 08/29/2007  . Hypertension associated with diabetes ([place]) 08/29/2007    Cameron Sprang, PT, MPT [facility] [address] [place], [place], [address] Phone: [phone]   Fax:  [phone] 03/12/20, 2:58 PM  Name: PEONY BARNER MRN: [id number] Date of Birth: Mar 28, 1976

## 2020-03-16 ENCOUNTER — Ambulatory Visit: Payer: Medicare HMO | Admitting: Rehabilitation

## 2020-03-19 ENCOUNTER — Ambulatory Visit: Payer: Medicare HMO | Admitting: Rehabilitation

## 2020-03-23 ENCOUNTER — Encounter: Payer: Self-pay | Admitting: Rehabilitation

## 2020-03-23 ENCOUNTER — Ambulatory Visit: Payer: Medicare HMO | Attending: Orthopedic Surgery | Admitting: Rehabilitation

## 2020-03-23 ENCOUNTER — Other Ambulatory Visit: Payer: Self-pay

## 2020-03-23 DIAGNOSIS — R293 Abnormal posture: Secondary | ICD-10-CM | POA: Diagnosis not present

## 2020-03-23 DIAGNOSIS — R2689 Other abnormalities of gait and mobility: Secondary | ICD-10-CM | POA: Insufficient documentation

## 2020-03-23 DIAGNOSIS — M25652 Stiffness of left hip, not elsewhere classified: Secondary | ICD-10-CM | POA: Insufficient documentation

## 2020-03-23 DIAGNOSIS — Z89512 Acquired absence of left leg below knee: Secondary | ICD-10-CM | POA: Diagnosis not present

## 2020-03-23 DIAGNOSIS — R296 Repeated falls: Secondary | ICD-10-CM | POA: Insufficient documentation

## 2020-03-23 DIAGNOSIS — R2681 Unsteadiness on feet: Secondary | ICD-10-CM | POA: Insufficient documentation

## 2020-03-23 NOTE — Therapy (Signed)
[facility] [address] [place], [place], [address] Phone: [phone]   Fax:  [phone]  Physical Therapy Treatment  Patient Details  Name: [name] [name] MRN: [id number] Date of Birth: [birth year] Referring Provider (PT): Meridee Score, MD   Encounter Date: 03/23/2020   PT End of Session - 03/23/20 1256    Visit Number 7    Number of Visits 17    Date for PT Re-Evaluation 04/12/20    Authorization Type [employer] [place] and Medicaid (10th visit needed)    Progress Note Due on Visit 10    PT Start Time 1100    PT Stop Time 1145    PT Time Calculation (min) 45 min    Activity Tolerance Patient limited by pain;Treatment limited secondary to medical complications (Comment)    Behavior During Therapy [facility] for tasks assessed/performed           Past Medical History:  Diagnosis Date  . Abdominal muscle pain 09/08/2016  . Abnormal Pap smear of cervix 2009  . Abscess    history of multiple abscesses  . Acute bilateral low back pain 02/13/2017  . Acute blood loss anemia   . Acute on chronic renal failure ([place]) 07/12/2012  . Acute renal failure ([place]) 07/12/2012  . Adjustment disorder with depressed mood 03/26/2017  . AKI (acute kidney injury) ([employer])   . Anemia of chronic disease 2002  . Anxiety    Panic attacks  . Bilateral lower extremity edema 05/13/2016  . Bipolar disorder ([place])   . Cataract    B/L cataract  . Cellulitis 05/21/2014   right eye  . Chronic bronchitis ([place])    "get it q yr" (05/13/2013)  . Chronic pain   . Dehiscence of amputation stump ([place])   . Depression   . Edema of lower extremity   . Endocarditis 2002   subacute bacterial endocarditis.   . Family history of anesthesia complication    "my mom has a hard time coming out from under"  . Fibromyalgia   . GERD (gastroesophageal reflux disease)    occ  . Heart murmur   . Herpes simplex type 1 infection 01/16/2018  . History of blood  transfusion    "just low blood count" (05/13/2013)  . Hyperlipidemia   . Hypertension   . Hypothyroidism   . Hypothyroidism, adult 03/21/2014  . Hypoxia   . Leukocytosis   . Necrosis ([place])    and ulceration  . Necrotizing fasciitis s/p OR debridements 07/06/2012  . Obesity   . OSA on CPAP    does not wear all the time  . Peripheral neuropathy   . Pneumonia   . Type II diabetes mellitus (HCC)    Type  II  . Wound dehiscence 09/04/2019    Past Surgical History:  Procedure Laterality Date  . ABDOMINAL AORTOGRAM W/LOWER EXTREMITY Left 03/19/2019   Procedure: ABDOMINAL AORTOGRAM W/LOWER EXTREMITY;  Surgeon: Serafina Mitchell, MD;  Location: [facility];  Service: Cardiovascular;  Laterality: Left;  . ABOVE KNEE LEG AMPUTATION Left 10/11/2019   wound dehisence   . AIR/FLUID EXCHANGE Right 12/31/2019   Procedure: AIR/FLUID EXCHANGE;  Surgeon: Jalene Mullet, MD;  Location: [facility];  Service: Ophthalmology;  Laterality: Right;  . AMPUTATION Left 04/07/2017   Procedure: LEFT BELOW KNEE AMPUTATION;  Surgeon: Newt Minion, MD;  Location: [facility];  Service: Orthopedics;  Laterality: Left;  . AMPUTATION Left 10/11/2019   Procedure: LEFT ABOVE KNEE AMPUTATION;  Surgeon:  Newt Minion, MD;  Location: [facility];  Service: Orthopedics;  Laterality: Left;  . CATARACT EXTRACTION W/PHACO Right 02/11/2020   Procedure: CATARACT EXTRACTION PHACO AND INTRAOCULAR LENS PLACEMENT (IOC);  Surgeon: Jalene Mullet, MD;  Location: [facility];  Service: Ophthalmology;  Laterality: Right;  . CORONARY ARTERY BYPASS GRAFT    . EYE SURGERY     [place]  . GAS INSERTION Right 12/31/2019   Procedure: INSERTION OF GAS;  Surgeon: Jalene Mullet, MD;  Location: [facility];  Service: Ophthalmology;  Laterality: Right;  . INCISION AND DRAINAGE ABSCESS     multiple I&Ds  . INCISION AND DRAINAGE ABSCESS Left 07/09/2012   Procedure: DRESSING CHANGE, THIGH WOUND;  Surgeon: Harl Bowie, MD;  Location: [facility];  Service: General;   Laterality: Left;  . INCISION AND DRAINAGE OF WOUND Left 07/07/2012   Procedure: IRRIGATION AND DEBRIDEMENT WOUND;  Surgeon: Harl Bowie, MD;  Location: [facility];  Service: General;  Laterality: Left;  . INCISION AND DRAINAGE PERIRECTAL ABSCESS Left 07/14/2012   Procedure: DEBRIDEMENT OF SKIN & SOFT TISSUE; DRESSING CHANGE UNDER ANESTHESIA;  Surgeon: Gayland Curry, MD,FACS;  Location: [facility];  Service: General;  Laterality: Left;  . INCISION AND DRAINAGE PERIRECTAL ABSCESS Left 07/16/2012   Procedure: I&D Left Thigh;  Surgeon: Gwenyth Ober, MD;  Location: [facility];  Service: General;  Laterality: Left;  . INCISION AND DRAINAGE PERIRECTAL ABSCESS N/A 01/05/2015   Procedure: IRRIGATION AND DEBRIDEMENT PERIRECTAL ABSCESS;  Surgeon: Donnie Mesa, MD;  Location: [facility];  Service: General;  Laterality: N/A;  . IRRIGATION AND DEBRIDEMENT ABSCESS Left 07/06/2012   Procedure: IRRIGATION AND DEBRIDEMENT ABSCESS BUTTOCKS AND THIGH;  Surgeon: Shann Medal, MD;  Location: [facility];  Service: General;  Laterality: Left;  . IRRIGATION AND DEBRIDEMENT ABSCESS Left 08/10/2012   Procedure: IRRIGATION AND DEBRIDEMENT ABSCESS;  Surgeon: Madilyn Hook, DO;  Location: [facility];  Service: General;  Laterality: Left;  . PARS PLANA VITRECTOMY Right 12/31/2019   Procedure: PARS PLANA VITRECTOMY WITH 25 GAUGE;  Surgeon: Jalene Mullet, MD;  Location: [facility];  Service: Ophthalmology;  Laterality: Right;  . PHOTOCOAGULATION WITH LASER Right 12/31/2019   Procedure: PHOTOCOAGULATION WITH LASER;  Surgeon: Jalene Mullet, MD;  Location: [facility];  Service: Ophthalmology;  Laterality: Right;  . REPAIR OF COMPLEX TRACTION RETINAL DETACHMENT Right 12/31/2019   Procedure: REPAIR OF COMPLEX TRACTION RETINAL;  Surgeon: Jalene Mullet, MD;  Location: [facility];  Service: Ophthalmology;  Laterality: Right;  . STUMP REVISION Left 06/09/2017   Procedure: REVISION LEFT BELOW KNEE AMPUTATION;  Surgeon: Newt Minion, MD;  Location: [facility];  Service:  Orthopedics;  Laterality: Left;  . STUMP REVISION Left 09/04/2019   Procedure: LEFT BELOW KNEE AMPUTATION REVISION;  Surgeon: Newt Minion, MD;  Location: [facility];  Service: Orthopedics;  Laterality: Left;  . STUMP REVISION Left 09/20/2019   Procedure: LEFT BELOW KNEE AMPUTATION REVISION;  Surgeon: Newt Minion, MD;  Location: [facility];  Service: Orthopedics;  Laterality: Left;  . TONSILLECTOMY  1994    There were no vitals filed for this visit.   Subjective Assessment - 03/23/20 1107    Subjective Pt reports wearing leg 2 hours/2 times a day.  Did not call [employer] yet about RW.    Limitations Walking;House hold activities;Standing    Currently in Pain? Yes    Pain Score 6     Pain Location Knee    Pain Orientation Right    Pain Descriptors / Indicators Aching;Sore  Pain Type Chronic pain    Pain Onset More than a month ago    Pain Frequency Intermittent    Aggravating Factors  standing for long periods    Pain Relieving Factors heat/medication                             [facility] Adult PT Treatment/Exercise - 03/23/20 1110      Transfers   Transfers Sit to Stand;Stand to Sit    Sit to Stand 5: Supervision;4: Min guard    Sit to Stand Details Visual cues/gestures for precautions/safety;Visual cues/gestures for sequencing;Verbal cues for sequencing    Sit to Stand Details (indicate cue type and reason) Cues for reaching back LUE as well or instead of RUE to allow for more equal weight bearing through prosthesis when sitting.      Stand to Sit 5: Supervision;4: Min guard    Stand to Sit Details (indicate cue type and reason) Verbal cues for sequencing;Verbal cues for technique;Verbal cues for precautions/safety    Stand to Sit Details cues for equal WB as before      Ambulation/Gait   Ambulation/Gait Yes    Ambulation/Gait Assistance 4: Min assist;1: +2 Total assist    Ambulation/Gait Assistance Details Pt ambulated for gait speed assessement without focus on  prosthetic knee flexion with cues for more upright posture throughout and increased hip extension.  Then worked on gait following pregait at counter top focusing on getting prosthetic knee to flex prior to swing phase of gait.  Pt did VERY well with getting knee to flex with most steps with less circumduction, however did note that she does tend to drag L foot with most steps, causing her to work harder for clearance and at times causing knee to remain flexed/quick knee flexion increasing fall risk.  Gerald Stabs to come to session on Thursday to see if prosthesis needs to be smaller.  Continued tactile cues at L pelvis to "ride" L leg all the way and improve L hip extension.   Pt did have near fall at end of session requiring +2 assist to prevent fall due to L foot sliding out from under her (not full WB on leg) as PT had added toe cap for improved clearance.  Removed toe cap and pt able to continue at min A level.     Ambulation Distance (Feet) 115 Feet   then another 100' x 2 reps    Assistive device Rolling walker;Prosthesis    Gait Pattern Step-to pattern;Decreased stride length;Lateral hip instability;Lateral trunk lean to right;Trunk flexed;Wide base of support    Ambulation Surface Level;Indoor    Gait Comments At counter top with single UE support on counter and HHA from PT on opposite side advancing RLE first, with forward weight shift, keeping weight on LLE as well with PT providing facilitation at pelvis for forward hip protraction.  Pt did very well getting knee to flex, some issues with foot clearance.  Performed x 4 laps along counter otp.       Prosthetics   Prosthetic Care Comments  Recommend pt increase wear time to 3 hours, 2x/day     Current prosthetic wear tolerance (days/week)  daily     Current prosthetic wear tolerance (#hours/day)  2 hrs, 2x/day     Current prosthetic weight-bearing tolerance (hours/day)  5-10 WB, but limited by R knee pain     Edema none     Residual limb condition  Pt reports no skin issues    Education Provided Proper wear schedule/adjustment;Proper weight-bearing schedule/adjustment    Person(s) Educated Patient    Education Method Explanation    Education Method Verbalized understanding;Needs further instruction    Donning Prosthesis Modified independent (device/increased time)                    PT Short Term Goals - 03/23/20 1111      PT SHORT TERM GOAL #1   Title Pt will be independent with initial standing HEP with prosthesis.   (Target Date: 03/13/20)    Baseline met per pt report    Time 4    Period Weeks    Status Achieved    Target Date 03/13/20      PT SHORT TERM GOAL #2   Title Pt will be able to don prosthesis at S level and tolerate wearing prosthesis 5 hrs, 2x/day with no skin issues and with good understanding of swelling management.    Baseline consistently 2hours, twice a day    Time 4    Period Weeks    Status Not Met      PT SHORT TERM GOAL #3   Title Pt will perform forward reach 5", reach down to knee level, and perform dynamic tasks with intermittent UE support x 10 mins at S level.    Baseline is able to reach forward 5", reach just past knee level (shin) but is intermittently standing 5-10 mins depending on knee pain    Time 4    Period Weeks    Status Partially Met      PT SHORT TERM GOAL #4   Title Pt will ambulate x 100' with RW at S level in simulated household environment to indicate improved mobility at home.    Baseline met 03/23/20 with wide RW (recommend S definitely with regular RW)    Time 4    Period Weeks    Status Achieved      PT SHORT TERM GOAL #5   Title Pt will improve gait speed to >/=.92 ft/sec w/ RW and prosthesis to indicate dec fall risk.    Baseline 1.45 ft/sec with wide RW 03/23/20    Time 4    Period Weeks    Status Achieved             PT Long Term Goals - 02/24/20 1257      PT LONG TERM GOAL #1   Title Pt will be independent with final HEP in order to indicate  improved functional mobility and balance with use of prosthesis.  (Target Date: 04/12/20)    Time 8    Period Weeks    Status New      PT LONG TERM GOAL #2   Title Pt will be able to don prosthesis at mod I level and tolerate wearing for 90% of awake hours without skin breakdown and with good understanding of swelling mangement.    Time 8    Period Weeks    Status New      PT LONG TERM GOAL #3   Title Pt will tolerate standing x 15 mins with intermittent UE support, 10" forward reach and ability to pick up item from floor with UE support at S level.    Time 8    Period Weeks    Status New      PT LONG TERM GOAL #4   Title Pt will improve gait speed to >/=1.52 ft/sec with LRAD  and prosthesis to indicate dec fall risk.    Time 8    Period Weeks    Status Revised      PT LONG TERM GOAL #5   Title Pt will ambulate 300' over unlevel outdoor surfaces with RW and prosthesis at S level in order to indicate improved community mobility.    Time 8    Period Weeks    Status Revised                 Plan - 03/23/20 1257    Clinical Impression Statement Skilled session focused on assessment of STGs and continuing to work on engaging L prosthetic knee during end of stance phase for swing as well as during sit<>stand transitions.  Pt has met 3/5 STGs, partially meeting standing balance goal, but not meeting wear time goal.  Pt did much better today with engaging knee into flexion during end of stance phase into swing phase, however still has difficulty pulling straight forward into flexion and feel that prosthesis may be slightly long.  Gerald Stabs to be at next session to assess.    Personal Factors and Comorbidities Comorbidity 3+;Past/Current Experience    Comorbidities see above    Examination-Activity Limitations Bend;Carry;Dressing;Stand;Stairs;Squat;Locomotion Level;Transfers    Examination-Participation Restrictions Community Activity    Stability/Clinical Decision Making  Evolving/Moderate complexity    Rehab Potential Good    PT Frequency 2x / week    PT Duration 8 weeks    PT Treatment/Interventions ADLs/Self Care Home Management;DME Instruction;Gait training;Stair training;Functional mobility training;Therapeutic activities;Therapeutic exercise;Balance training;Neuromuscular re-education;Patient/family education;Prosthetic Training;Compression bandaging;Passive range of motion;Energy conservation    PT Next Visit Plan Gerald Stabs to be at next session. Continue with standing balance until knee is adjusted, sit<>stand, gait with knee flexion-having her ride prosthesis all the way over stance, dynamic balance with decreasing UE support, standing strengthening (if unable to tolerate due to knee pain, give supine strengthening),  if able to tolerate wearing 2hours, 2x/day, then up to 3hrs, 2x/day    Consulted and Agree with Plan of Care Patient           Patient will benefit from skilled therapeutic intervention in order to improve the following deficits and impairments:  Abnormal gait, Decreased activity tolerance, Decreased balance, Decreased endurance, Decreased knowledge of precautions, Decreased knowledge of use of DME, Decreased mobility, Decreased range of motion, Decreased strength, Difficulty walking, Increased edema, Impaired perceived functional ability, Impaired flexibility, Impaired sensation, Postural dysfunction, Improper body mechanics, Prosthetic Dependency  Visit Diagnosis: Unsteadiness on feet  Other abnormalities of gait and mobility  Abnormal posture  Repeated falls     Problem List Patient Active Problem List   Diagnosis Date Noted  . Chronic pain syndrome 01/16/2020  . Hx of AKA (above knee amputation), left ([place])   . Symptomatic anemia 10/21/2019  . Anemia 10/21/2019  . Acute respiratory failure with hypoxia ([place]) 10/21/2019  . Blurry vision, right eye 10/08/2019  . Tobacco use 03/01/2019  . Phantom limb pain ([place]) 11/08/2018  .  Current moderate episode of major depressive disorder ([place])   . Hyperkalemia   . Anemia of chronic disease   . Severe protein-calorie malnutrition ([place])   . Fall   . Status post below knee amputation, left ([place]) 04/11/2017  . Bipolar affective disorder ([place])   . Heart failure with preserved ejection fraction ([place]) 02/09/2017  . Routine adult health maintenance 12/08/2016  . Chronic pain of right knee 09/08/2016  . Chronic kidney disease (CKD), stage III (moderate) ([place]) 07/15/2016  .  OSA (obstructive sleep apnea) 05/13/2016  . Morbid obesity due to excess calories ([place]) 11/27/2015  . Hypothyroidism 04/25/2013  . Controlled type 2 diabetes mellitus ([place]) 07/12/2012  . Carpal tunnel syndrome, bilateral 01/25/2012  . Diabetic polyneuropathy associated with type 2 diabetes mellitus ([place]) 07/04/2011  . Anxiety and depression 06/02/2011  . GERD 12/06/2007  . Hyperlipidemia 08/29/2007  . Hypertension associated with diabetes ([place]) 08/29/2007    Cameron Sprang, PT, MPT [facility] [address] [place], [place], [address] Phone: [phone]   Fax:  [phone] 03/23/20, 1:05 PM  Name: [name] [name] MRN: [id number] Date of Birth: 02/04/1976

## 2020-03-24 ENCOUNTER — Other Ambulatory Visit: Payer: Self-pay | Admitting: Internal Medicine

## 2020-03-24 ENCOUNTER — Other Ambulatory Visit: Payer: Self-pay | Admitting: Student

## 2020-03-24 DIAGNOSIS — F32A Depression, unspecified: Secondary | ICD-10-CM

## 2020-03-24 DIAGNOSIS — F419 Anxiety disorder, unspecified: Secondary | ICD-10-CM

## 2020-03-24 DIAGNOSIS — G894 Chronic pain syndrome: Secondary | ICD-10-CM

## 2020-03-24 MED ORDER — HYDROXYZINE HCL 25 MG PO TABS: ORAL_TABLET | ORAL | 0 refills | Status: DC

## 2020-03-26 ENCOUNTER — Encounter: Payer: Self-pay | Admitting: Rehabilitation

## 2020-03-26 ENCOUNTER — Ambulatory Visit: Payer: Medicare HMO | Admitting: Rehabilitation

## 2020-03-26 ENCOUNTER — Other Ambulatory Visit: Payer: Self-pay

## 2020-03-26 ENCOUNTER — Telehealth: Payer: Self-pay | Admitting: Rehabilitation

## 2020-03-26 ENCOUNTER — Other Ambulatory Visit: Payer: Self-pay | Admitting: Orthopedic Surgery

## 2020-03-26 DIAGNOSIS — R2681 Unsteadiness on feet: Secondary | ICD-10-CM

## 2020-03-26 DIAGNOSIS — Z89512 Acquired absence of left leg below knee: Secondary | ICD-10-CM

## 2020-03-26 DIAGNOSIS — R2689 Other abnormalities of gait and mobility: Secondary | ICD-10-CM | POA: Diagnosis not present

## 2020-03-26 DIAGNOSIS — R296 Repeated falls: Secondary | ICD-10-CM | POA: Diagnosis not present

## 2020-03-26 DIAGNOSIS — R293 Abnormal posture: Secondary | ICD-10-CM | POA: Diagnosis not present

## 2020-03-26 DIAGNOSIS — M25652 Stiffness of left hip, not elsewhere classified: Secondary | ICD-10-CM | POA: Diagnosis not present

## 2020-03-26 NOTE — Therapy (Signed)
[facility] [address] [place], [place], [address] Phone: [phone]   Fax:  [phone]  Physical Therapy Treatment  Patient Details  Name: [name] [name] MRN: [id number] Date of Birth: 06/22/1975 Referring Provider (PT): Meridee Score, MD   Encounter Date: 03/26/2020   PT End of Session - 03/26/20 1347    Visit Number 8    Number of Visits 17    Date for PT Re-Evaluation 04/12/20    Authorization Type [employer] [place] and Medicaid (10th visit needed)    Progress Note Due on Visit 10    PT Start Time 1018    PT Stop Time 1100    PT Time Calculation (min) 42 min    Activity Tolerance Patient limited by pain;Treatment limited secondary to medical complications (Comment)    Behavior During Therapy [facility] for tasks assessed/performed           Past Medical History:  Diagnosis Date   Abdominal muscle pain 09/08/2016   Abnormal Pap smear of cervix 2009   Abscess    history of multiple abscesses   Acute bilateral low back pain 02/13/2017   Acute blood loss anemia    Acute on chronic renal failure ([place]) 07/12/2012   Acute renal failure ([place]) 07/12/2012   Adjustment disorder with depressed mood 03/26/2017   AKI (acute kidney injury) ([place])    Anemia of chronic disease 2002   Anxiety    Panic attacks   Bilateral lower extremity edema 05/13/2016   Bipolar disorder ([place])    Cataract    B/L cataract   Cellulitis 05/21/2014   right eye   Chronic bronchitis ([place])    "get it q yr" (05/13/2013)   Chronic pain    Dehiscence of amputation stump (HCC)    Depression    Edema of lower extremity    Endocarditis 2002   subacute bacterial endocarditis.    Family history of anesthesia complication    "my mom has a hard time coming out from under"   Fibromyalgia    GERD (gastroesophageal reflux disease)    occ   Heart murmur    Herpes simplex type 1 infection 01/16/2018   History of blood  transfusion    "just low blood count" (05/13/2013)   Hyperlipidemia    Hypertension    Hypothyroidism    Hypothyroidism, adult 03/21/2014   Hypoxia    Leukocytosis    Necrosis (HCC)    and ulceration   Necrotizing fasciitis s/p OR debridements 07/06/2012   Obesity    OSA on CPAP    does not wear all the time   Peripheral neuropathy    Pneumonia    Type II diabetes mellitus ([place])    Type  II   Wound dehiscence 09/04/2019    Past Surgical History:  Procedure Laterality Date   ABDOMINAL AORTOGRAM W/LOWER EXTREMITY Left 03/19/2019   Procedure: ABDOMINAL AORTOGRAM W/LOWER EXTREMITY;  Surgeon: Serafina Mitchell, MD;  Location: [facility];  Service: Cardiovascular;  Laterality: Left;   ABOVE KNEE LEG AMPUTATION Left 10/11/2019   wound dehisence    AIR/FLUID EXCHANGE Right 12/31/2019   Procedure: AIR/FLUID EXCHANGE;  Surgeon: Jalene Mullet, MD;  Location: [place];  Service: Ophthalmology;  Laterality: Right;   AMPUTATION Left 04/07/2017   Procedure: LEFT BELOW KNEE AMPUTATION;  Surgeon: Newt Minion, MD;  Location: [place];  Service: Orthopedics;  Laterality: Left;   AMPUTATION Left 10/11/2019   Procedure: LEFT ABOVE KNEE AMPUTATION;  Surgeon:  Newt Minion, MD;  Location: [place];  Service: Orthopedics;  Laterality: Left;   CATARACT EXTRACTION W/PHACO Right 02/11/2020   Procedure: CATARACT EXTRACTION PHACO AND INTRAOCULAR LENS PLACEMENT (IOC);  Surgeon: Jalene Mullet, MD;  Location: [place];  Service: Ophthalmology;  Laterality: Right;   CORONARY ARTERY BYPASS GRAFT     EYE SURGERY     lazer   GAS INSERTION Right 12/31/2019   Procedure: INSERTION OF GAS;  Surgeon: Jalene Mullet, MD;  Location: [place];  Service: Ophthalmology;  Laterality: Right;   INCISION AND DRAINAGE ABSCESS     multiple I&Ds   INCISION AND DRAINAGE ABSCESS Left 07/09/2012   Procedure: DRESSING CHANGE, THIGH WOUND;  Surgeon: Harl Bowie, MD;  Location: [place];  Service: General;   Laterality: Left;   INCISION AND DRAINAGE OF WOUND Left 07/07/2012   Procedure: IRRIGATION AND DEBRIDEMENT WOUND;  Surgeon: Harl Bowie, MD;  Location: [place];  Service: General;  Laterality: Left;   INCISION AND DRAINAGE PERIRECTAL ABSCESS Left 07/14/2012   Procedure: DEBRIDEMENT OF SKIN & SOFT TISSUE; DRESSING CHANGE UNDER ANESTHESIA;  Surgeon: Gayland Curry, MD,FACS;  Location: [place];  Service: General;  Laterality: Left;   INCISION AND DRAINAGE PERIRECTAL ABSCESS Left 07/16/2012   Procedure: I&D Left Thigh;  Surgeon: Gwenyth Ober, MD;  Location: [place];  Service: General;  Laterality: Left;   INCISION AND DRAINAGE PERIRECTAL ABSCESS N/A 01/05/2015   Procedure: IRRIGATION AND DEBRIDEMENT PERIRECTAL ABSCESS;  Surgeon: Donnie Mesa, MD;  Location: [place];  Service: General;  Laterality: N/A;   IRRIGATION AND DEBRIDEMENT ABSCESS Left 07/06/2012   Procedure: IRRIGATION AND DEBRIDEMENT ABSCESS BUTTOCKS AND THIGH;  Surgeon: Shann Medal, MD;  Location: [place];  Service: General;  Laterality: Left;   IRRIGATION AND DEBRIDEMENT ABSCESS Left 08/10/2012   Procedure: IRRIGATION AND DEBRIDEMENT ABSCESS;  Surgeon: Madilyn Hook, DO;  Location: [place];  Service: General;  Laterality: Left;   PARS PLANA VITRECTOMY Right 12/31/2019   Procedure: PARS PLANA VITRECTOMY WITH 25 GAUGE;  Surgeon: Jalene Mullet, MD;  Location: [place];  Service: Ophthalmology;  Laterality: Right;   PHOTOCOAGULATION WITH LASER Right 12/31/2019   Procedure: PHOTOCOAGULATION WITH LASER;  Surgeon: Jalene Mullet, MD;  Location: [place];  Service: Ophthalmology;  Laterality: Right;   REPAIR OF COMPLEX TRACTION RETINAL DETACHMENT Right 12/31/2019   Procedure: REPAIR OF COMPLEX TRACTION RETINAL;  Surgeon: Jalene Mullet, MD;  Location: [place];  Service: Ophthalmology;  Laterality: Right;   STUMP REVISION Left 06/09/2017   Procedure: REVISION LEFT BELOW KNEE AMPUTATION;  Surgeon: Newt Minion, MD;  Location: [place];  Service:  Orthopedics;  Laterality: Left;   STUMP REVISION Left 09/04/2019   Procedure: LEFT BELOW KNEE AMPUTATION REVISION;  Surgeon: Newt Minion, MD;  Location: [place];  Service: Orthopedics;  Laterality: Left;   STUMP REVISION Left 09/20/2019   Procedure: LEFT BELOW KNEE AMPUTATION REVISION;  Surgeon: Newt Minion, MD;  Location: [place];  Service: Orthopedics;  Laterality: Left;   TONSILLECTOMY  1994    There were no vitals filed for this visit.   Subjective Assessment - 03/26/20 1025    Subjective Pt reports wearing leg 2 hours/2 times a day, she did wear for almost 4 hours one day.   Did not call [employer] yet about RW.    Limitations Walking;House hold activities;Standing    Patient Stated Goals "I want to be able to walk on this leg and move around"    Currently in  Pain? Yes    Pain Score 6     Pain Location Knee    Pain Orientation Right    Pain Descriptors / Indicators Aching;Sore    Pain Type Chronic pain    Pain Onset More than a month ago    Pain Frequency Intermittent    Aggravating Factors  standing/walking for longer periods    Pain Relieving Factors heat/medication                             [facility] Adult PT Treatment/Exercise - 03/26/20 1025      Transfers   Transfers Sit to Stand;Stand to Sit    Sit to Stand 5: Supervision    Sit to Stand Details Visual cues/gestures for precautions/safety;Visual cues/gestures for sequencing;Verbal cues for sequencing    Stand to Sit 5: Supervision    Stand to Sit Details (indicate cue type and reason) Verbal cues for sequencing;Verbal cues for technique;Verbal cues for precautions/safety    Stand to Sit Details Continued cues for reaching back with LUE so WB through prosthesis when sitting.       Ambulation/Gait   Ambulation/Gait Yes    Ambulation/Gait Assistance 4: Min guard;4: Min assist    Ambulation/Gait Assistance Details Brooke Pace from [facility] present to assess gait for prosthetic adjustments.  Pt ambulated x 6  reps of 50' with RW at min/guard to min A with cues for upright posture and spending more time on prostheis during stance to allow knee to flex and cues to step straight through.  However she tends to circumduct and IR causing prosthesis to rotate on her.  Also note pistoning today and feel that socket is indeed moving on her limb.  Gerald Stabs did make several adjustments but would like to see her in office to add some padding to socket and rotate knee slightly.      Ambulation Distance (Feet) 50 Feet   x 6 reps    Assistive device Rolling walker;Prosthesis    Gait Pattern Step-to pattern;Decreased stride length;Lateral hip instability;Lateral trunk lean to right;Trunk flexed;Wide base of support    Ambulation Surface Level;Indoor      Prosthetics   Prosthetic Care Comments  Continue to educate to increase wear time to 3 hours, 2 times/day     Current prosthetic wear tolerance (days/week)  daily     Current prosthetic wear tolerance (#hours/day)  2 hrs, 2x/day     Current prosthetic weight-bearing tolerance (hours/day)  5-10 WB, but limited by R knee pain     Edema none     Residual limb condition  Pt reports no skin issues    Education Provided Proper wear schedule/adjustment;Proper weight-bearing schedule/adjustment    Person(s) Educated Patient    Education Method Explanation;Verbal cues    Education Method Verbalized understanding;Needs further instruction    Donning Prosthesis Modified independent (device/increased time)                  PT Education - 03/26/20 1346    Education Details Education to speak with her "paw paw" to see if he can take her to [employer] vs cancelling apt Monday so that she can go then.  Pt to call back and let us know.    Person(s) Educated Patient    Methods Explanation    Comprehension Verbalized understanding            PT Short Term Goals - 03/23/20 1111  PT SHORT TERM GOAL #1   Title Pt will be independent with initial standing HEP  with prosthesis.   (Target Date: 03/13/20)    Baseline met per pt report    Time 4    Period Weeks    Status Achieved    Target Date 03/13/20      PT SHORT TERM GOAL #2   Title Pt will be able to don prosthesis at S level and tolerate wearing prosthesis 5 hrs, 2x/day with no skin issues and with good understanding of swelling management.    Baseline consistently 2hours, twice a day    Time 4    Period Weeks    Status Not Met      PT SHORT TERM GOAL #3   Title Pt will perform forward reach 5", reach down to knee level, and perform dynamic tasks with intermittent UE support x 10 mins at S level.    Baseline is able to reach forward 5", reach just past knee level (shin) but is intermittently standing 5-10 mins depending on knee pain    Time 4    Period Weeks    Status Partially Met      PT SHORT TERM GOAL #4   Title Pt will ambulate x 100' with RW at S level in simulated household environment to indicate improved mobility at home.    Baseline met 03/23/20 with wide RW (recommend S definitely with regular RW)    Time 4    Period Weeks    Status Achieved      PT SHORT TERM GOAL #5   Title Pt will improve gait speed to >/=.92 ft/sec w/ RW and prosthesis to indicate dec fall risk.    Baseline 1.45 ft/sec with wide RW 03/23/20    Time 4    Period Weeks    Status Achieved             PT Long Term Goals - 02/24/20 1257      PT LONG TERM GOAL #1   Title Pt will be independent with final HEP in order to indicate improved functional mobility and balance with use of prosthesis.  (Target Date: 04/12/20)    Time 8    Period Weeks    Status New      PT LONG TERM GOAL #2   Title Pt will be able to don prosthesis at mod I level and tolerate wearing for 90% of awake hours without skin breakdown and with good understanding of swelling mangement.    Time 8    Period Weeks    Status New      PT LONG TERM GOAL #3   Title Pt will tolerate standing x 15 mins with intermittent UE support,  10" forward reach and ability to pick up item from floor with UE support at S level.    Time 8    Period Weeks    Status New      PT LONG TERM GOAL #4   Title Pt will improve gait speed to >/=1.52 ft/sec with LRAD and prosthesis to indicate dec fall risk.    Time 8    Period Weeks    Status Revised      PT LONG TERM GOAL #5   Title Pt will ambulate 300' over unlevel outdoor surfaces with RW and prosthesis at S level in order to indicate improved community mobility.    Time 8    Period Weeks    Status Revised  Plan - 03/26/20 1350    Clinical Impression Statement Skilled session focused on assessment of prosthesis during gait with Brooke Pace from [facility].  After several bouts of gait and adjustments made to leg, it was determined that she would still need to make an appt with them to add padding to socket and adjust knee further.  Pt verbalized understanding and is to work on getting this appt scheduled.    Personal Factors and Comorbidities Comorbidity 3+;Past/Current Experience    Comorbidities see above    Examination-Activity Limitations Bend;Carry;Dressing;Stand;Stairs;Squat;Locomotion Level;Transfers    Examination-Participation Restrictions Community Activity    Stability/Clinical Decision Making Evolving/Moderate complexity    Rehab Potential Good    PT Frequency 2x / week    PT Duration 8 weeks    PT Treatment/Interventions ADLs/Self Care Home Management;DME Instruction;Gait training;Stair training;Functional mobility training;Therapeutic activities;Therapeutic exercise;Balance training;Neuromuscular re-education;Patient/family education;Prosthetic Training;Compression bandaging;Passive range of motion;Energy conservation    PT Next Visit Plan Continue with standing balance until knee is adjusted, sit<>stand, gait with knee flexion-having her ride prosthesis all the way over stance, dynamic balance with decreasing UE support, standing strengthening  (if unable to tolerate due to knee pain, give supine strengthening),  if able to tolerate wearing 2hours, 2x/day, then up to 3hrs, 2x/day    Consulted and Agree with Plan of Care Patient           Patient will benefit from skilled therapeutic intervention in order to improve the following deficits and impairments:  Abnormal gait, Decreased activity tolerance, Decreased balance, Decreased endurance, Decreased knowledge of precautions, Decreased knowledge of use of DME, Decreased mobility, Decreased range of motion, Decreased strength, Difficulty walking, Increased edema, Impaired perceived functional ability, Impaired flexibility, Impaired sensation, Postural dysfunction, Improper body mechanics, Prosthetic Dependency  Visit Diagnosis: Unsteadiness on feet  Other abnormalities of gait and mobility  Abnormal posture     Problem List Patient Active Problem List   Diagnosis Date Noted   Chronic pain syndrome 01/16/2020   Hx of AKA (above knee amputation), left (HCC)    Symptomatic anemia 10/21/2019   Anemia 10/21/2019   Acute respiratory failure with hypoxia (HCC) 10/21/2019   Blurry vision, right eye 10/08/2019   Tobacco use 03/01/2019   Phantom limb pain (HCC) 11/08/2018   Current moderate episode of major depressive disorder (HCC)    Hyperkalemia    Anemia of chronic disease    Severe protein-calorie malnutrition ([place])    Fall    Status post below knee amputation, left ([place]) 04/11/2017   Bipolar affective disorder ([place])    Heart failure with preserved ejection fraction ([place]) 02/09/2017   Routine adult health maintenance 12/08/2016   Chronic pain of right knee 09/08/2016   Chronic kidney disease (CKD), stage III (moderate) (HCC) 07/15/2016   OSA (obstructive sleep apnea) 05/13/2016   Morbid obesity due to excess calories ([place]) 11/27/2015   Hypothyroidism 04/25/2013   Controlled type 2 diabetes mellitus ([place]) 07/12/2012   Carpal tunnel syndrome,  bilateral 01/25/2012   Diabetic polyneuropathy associated with type 2 diabetes mellitus ([place]) 07/04/2011   Anxiety and depression 06/02/2011   GERD 12/06/2007   Hyperlipidemia 08/29/2007   Hypertension associated with diabetes ([place]) 08/29/2007    Cameron Sprang, PT, MPT [facility] [address] [place], [place], [address] Phone: [phone]   Fax:  [phone] 03/26/20, 1:53 PM  Name: [name] [name] MRN: [id number] Date of Birth: 03/24/1976

## 2020-03-26 NOTE — Telephone Encounter (Signed)
Dr. Sharol Given,   I am seeing [facility] here at [facility] for prosthetic training.  She was issued a regular RW at some point in time and needs a WIDE RW (not bariatric).  [employer] says that they can switch it but will need a new order.  Can you write an order in the [place] work que for [employer] and I will get that to her.    Thanks so much! Cameron Sprang, PT, MPT [facility] [address] [place], [place], [address] Phone: [phone]   Fax:  [phone] 03/26/20, 12:57 PM

## 2020-03-30 ENCOUNTER — Ambulatory Visit: Payer: Medicare HMO

## 2020-04-02 ENCOUNTER — Encounter: Payer: Self-pay | Admitting: Rehabilitation

## 2020-04-02 ENCOUNTER — Ambulatory Visit: Payer: Medicare HMO | Admitting: Rehabilitation

## 2020-04-02 ENCOUNTER — Other Ambulatory Visit: Payer: Self-pay

## 2020-04-02 DIAGNOSIS — R293 Abnormal posture: Secondary | ICD-10-CM | POA: Diagnosis not present

## 2020-04-02 DIAGNOSIS — R296 Repeated falls: Secondary | ICD-10-CM | POA: Diagnosis not present

## 2020-04-02 DIAGNOSIS — R2689 Other abnormalities of gait and mobility: Secondary | ICD-10-CM | POA: Diagnosis not present

## 2020-04-02 DIAGNOSIS — M25652 Stiffness of left hip, not elsewhere classified: Secondary | ICD-10-CM | POA: Diagnosis not present

## 2020-04-02 DIAGNOSIS — R2681 Unsteadiness on feet: Secondary | ICD-10-CM

## 2020-04-02 NOTE — Therapy (Signed)
[facility] [address] [place], [place], [address] Phone: [phone]   Fax:  [phone]  Physical Therapy Treatment  Patient Details  Name: [name] [name] MRN: [id number] Date of Birth: 1975/12/05 Referring Provider (PT): Meridee Score, MD   Encounter Date: 04/02/2020   PT End of Session - 04/02/20 1421    Visit Number 9    Number of Visits 17    Date for PT Re-Evaluation 04/12/20    Authorization Type [employer] [place] and Medicaid (10th visit needed)    Authorization Time Period 9/22-11/21    Progress Note Due on Visit 10    PT Start Time 1017    PT Stop Time 1100    PT Time Calculation (min) 43 min    Activity Tolerance Patient limited by pain;Treatment limited secondary to medical complications (Comment)    Behavior During Therapy [facility] for tasks assessed/performed           Past Medical History:  Diagnosis Date  . Abdominal muscle pain 09/08/2016  . Abnormal Pap smear of cervix 2009  . Abscess    history of multiple abscesses  . Acute bilateral low back pain 02/13/2017  . Acute blood loss anemia   . Acute on chronic renal failure ([place]) 07/12/2012  . Acute renal failure ([place]) 07/12/2012  . Adjustment disorder with depressed mood 03/26/2017  . AKI (acute kidney injury) ([place])   . Anemia of chronic disease 2002  . Anxiety    Panic attacks  . Bilateral lower extremity edema 05/13/2016  . Bipolar disorder ([place])   . Cataract    B/L cataract  . Cellulitis 05/21/2014   right eye  . Chronic bronchitis ([place])    "get it q yr" (05/13/2013)  . Chronic pain   . Dehiscence of amputation stump ([place])   . Depression   . Edema of lower extremity   . Endocarditis 2002   subacute bacterial endocarditis.   . Family history of anesthesia complication    "my mom has a hard time coming out from under"  . Fibromyalgia   . GERD (gastroesophageal reflux disease)    occ  . Heart murmur   . Herpes simplex type 1 infection  01/16/2018  . History of blood transfusion    "just low blood count" (05/13/2013)  . Hyperlipidemia   . Hypertension   . Hypothyroidism   . Hypothyroidism, adult 03/21/2014  . Hypoxia   . Leukocytosis   . Necrosis ([place])    and ulceration  . Necrotizing fasciitis s/p OR debridements 07/06/2012  . Obesity   . OSA on CPAP    does not wear all the time  . Peripheral neuropathy   . Pneumonia   . Type II diabetes mellitus (HCC)    Type  II  . Wound dehiscence 09/04/2019    Past Surgical History:  Procedure Laterality Date  . ABDOMINAL AORTOGRAM W/LOWER EXTREMITY Left 03/19/2019   Procedure: ABDOMINAL AORTOGRAM W/LOWER EXTREMITY;  Surgeon: Serafina Mitchell, MD;  Location: [facility];  Service: Cardiovascular;  Laterality: Left;  . ABOVE KNEE LEG AMPUTATION Left 10/11/2019   wound dehisence   . AIR/FLUID EXCHANGE Right 12/31/2019   Procedure: AIR/FLUID EXCHANGE;  Surgeon: Jalene Mullet, MD;  Location: [facility];  Service: Ophthalmology;  Laterality: Right;  . AMPUTATION Left 04/07/2017   Procedure: LEFT BELOW KNEE AMPUTATION;  Surgeon: Newt Minion, MD;  Location: [facility];  Service: Orthopedics;  Laterality: Left;  . AMPUTATION Left 10/11/2019  Procedure: LEFT ABOVE KNEE AMPUTATION;  Surgeon: Newt Minion, MD;  Location: [facility];  Service: Orthopedics;  Laterality: Left;  . CATARACT EXTRACTION W/PHACO Right 02/11/2020   Procedure: CATARACT EXTRACTION PHACO AND INTRAOCULAR LENS PLACEMENT (IOC);  Surgeon: Jalene Mullet, MD;  Location: [facility];  Service: Ophthalmology;  Laterality: Right;  . CORONARY ARTERY BYPASS GRAFT    . EYE SURGERY     lazer  . GAS INSERTION Right 12/31/2019   Procedure: INSERTION OF GAS;  Surgeon: Jalene Mullet, MD;  Location: [facility];  Service: Ophthalmology;  Laterality: Right;  . INCISION AND DRAINAGE ABSCESS     multiple I&Ds  . INCISION AND DRAINAGE ABSCESS Left 07/09/2012   Procedure: DRESSING CHANGE, THIGH WOUND;  Surgeon: Harl Bowie, MD;   Location: [facility];  Service: General;  Laterality: Left;  . INCISION AND DRAINAGE OF WOUND Left 07/07/2012   Procedure: IRRIGATION AND DEBRIDEMENT WOUND;  Surgeon: Harl Bowie, MD;  Location: [facility];  Service: General;  Laterality: Left;  . INCISION AND DRAINAGE PERIRECTAL ABSCESS Left 07/14/2012   Procedure: DEBRIDEMENT OF SKIN & SOFT TISSUE; DRESSING CHANGE UNDER ANESTHESIA;  Surgeon: Gayland Curry, MD,FACS;  Location: [facility];  Service: General;  Laterality: Left;  . INCISION AND DRAINAGE PERIRECTAL ABSCESS Left 07/16/2012   Procedure: I&D Left Thigh;  Surgeon: Gwenyth Ober, MD;  Location: [place];  Service: General;  Laterality: Left;  . INCISION AND DRAINAGE PERIRECTAL ABSCESS N/A 01/05/2015   Procedure: IRRIGATION AND DEBRIDEMENT PERIRECTAL ABSCESS;  Surgeon: Donnie Mesa, MD;  Location: [facility];  Service: General;  Laterality: N/A;  . IRRIGATION AND DEBRIDEMENT ABSCESS Left 07/06/2012   Procedure: IRRIGATION AND DEBRIDEMENT ABSCESS BUTTOCKS AND THIGH;  Surgeon: Shann Medal, MD;  Location: [facility];  Service: General;  Laterality: Left;  . IRRIGATION AND DEBRIDEMENT ABSCESS Left 08/10/2012   Procedure: IRRIGATION AND DEBRIDEMENT ABSCESS;  Surgeon: Madilyn Hook, DO;  Location: [facility];  Service: General;  Laterality: Left;  . PARS PLANA VITRECTOMY Right 12/31/2019   Procedure: PARS PLANA VITRECTOMY WITH 25 GAUGE;  Surgeon: Jalene Mullet, MD;  Location: [facility];  Service: Ophthalmology;  Laterality: Right;  . PHOTOCOAGULATION WITH LASER Right 12/31/2019   Procedure: PHOTOCOAGULATION WITH LASER;  Surgeon: Jalene Mullet, MD;  Location: [facility];  Service: Ophthalmology;  Laterality: Right;  . REPAIR OF COMPLEX TRACTION RETINAL DETACHMENT Right 12/31/2019   Procedure: REPAIR OF COMPLEX TRACTION RETINAL;  Surgeon: Jalene Mullet, MD;  Location: [facility];  Service: Ophthalmology;  Laterality: Right;  . STUMP REVISION Left 06/09/2017   Procedure: REVISION LEFT BELOW KNEE AMPUTATION;  Surgeon: Newt Minion, MD;   Location: [facility];  Service: Orthopedics;  Laterality: Left;  . STUMP REVISION Left 09/04/2019   Procedure: LEFT BELOW KNEE AMPUTATION REVISION;  Surgeon: Newt Minion, MD;  Location: [facility];  Service: Orthopedics;  Laterality: Left;  . STUMP REVISION Left 09/20/2019   Procedure: LEFT BELOW KNEE AMPUTATION REVISION;  Surgeon: Newt Minion, MD;  Location: [facility];  Service: Orthopedics;  Laterality: Left;  . TONSILLECTOMY  1994    There were no vitals filed for this visit.   Subjective Assessment - 04/02/20 1028    Subjective Pt consistently wearing 2 hours, 2 times per day.  No issues. did have a fall from w/c outside and when walking right before appt with [facility] to get pads.  Did get pads placed and knee adjusted.    Limitations Walking;House hold activities;Standing    Patient Stated Goals "  I want to be able to walk on this leg and move around"    Currently in Pain? Yes    Pain Score 5     Pain Onset More than a month ago               Prosthetics Assessment - 04/02/20 [place] with Skin check;Residual limb care    Prosthetic Care Dependent with Proper wear schedule/adjustment;Proper weight-bearing schedule/adjustment    Prosthetic Care Comments  recommend she increase to 3 hrs, 2x/day     Donning prosthesis  Modified independent (Device/Increase time)    Doffing prosthesis  Modified independent (Device/Increase time)    Current prosthetic wear tolerance (days/week)  daily     Current prosthetic wear tolerance (#hours/day)  2 hrs, 2x/day     Current prosthetic weight-bearing tolerance (hours/day)  5-10 WB, but limited by R knee pain     Edema none     Residual limb condition  Pt reports no skin issues                        [facility] Adult PT Treatment/Exercise - 04/02/20 1020      Transfers   Transfers Sit to Stand;Stand to Sit    Sit to Stand 5: Supervision;4: Min guard    Sit to Stand Details Visual cues/gestures for  precautions/safety;Visual cues/gestures for sequencing;Verbal cues for sequencing    Stand to Sit 5: Supervision    Stand to Sit Details (indicate cue type and reason) Verbal cues for sequencing;Verbal cues for technique;Verbal cues for precautions/safety    Comments Worked on sit<>stand both during session and in // bars to low bar stool with emphasis on equal WB and midline posture when sitting/standing to engage prosthetic knee.  Pt did very well when cued, however has difficulty carrying this over from session to session.       Ambulation/Gait   Ambulation/Gait Yes    Ambulation/Gait Assistance 5: Supervision;4: Min guard    Ambulation/Gait Assistance Details Able to focus on gait training with L prosthetic knee flex at end of stance phase of gait with forward swing through and avoiding circumduction.  We began in // bars with use of mirror for visual feedback on pposture and technique.  PT also placed cones just lateral of prosthesis in order to reduce circumduction during swing through.  Pt did very well with task.  Then transitioned back to RW with good carryover.  Do note some fatigue in L hip after remaining on feet for longer periods of time and she tends to forward flex trunk when fatigued.      Ambulation Distance (Feet) 115 Feet   another 4 round of 10' in // bars   Assistive device Rolling walker;Prosthesis;Parallel bars    Gait Pattern Step-to pattern;Decreased stride length;Lateral hip instability;Lateral trunk lean to right;Trunk flexed;Wide base of support    Ambulation Surface Level;Indoor    Stairs Yes    Stairs Assistance 4: Min assist    Stairs Assistance Details (indicate cue type and reason) Had pt perform backwards x 2 steps keeping RW on ground as this is her set up at home.  She reports this causes increased R knee pain and would prefer to do as she has been doing at home.  She does report that she has help when doing at home.     Stair Management Technique No rails;With  walker;Step to pattern;Backwards;Forwards  Number of Stairs 2    Height of Stairs 6    Gait Comments Worked on stepping RLE down from aerobic step with BUE support to allow her to engage prosthetic knee.  She tends to take weight off knee and it buckles coming off of step.  Perfomed x 8 reps                   PT Education - 04/02/20 1421    Education Details PT to fax RW order to [place] today so that it can be set up to be delivered.    Person(s) Educated Patient    Methods Explanation    Comprehension Verbalized understanding            PT Short Term Goals - 03/23/20 1111      PT SHORT TERM GOAL #1   Title Pt will be independent with initial standing HEP with prosthesis.   (Target Date: 03/13/20)    Baseline met per pt report    Time 4    Period Weeks    Status Achieved    Target Date 03/13/20      PT SHORT TERM GOAL #2   Title Pt will be able to don prosthesis at S level and tolerate wearing prosthesis 5 hrs, 2x/day with no skin issues and with good understanding of swelling management.    Baseline consistently 2hours, twice a day    Time 4    Period Weeks    Status Not Met      PT SHORT TERM GOAL #3   Title Pt will perform forward reach 5", reach down to knee level, and perform dynamic tasks with intermittent UE support x 10 mins at S level.    Baseline is able to reach forward 5", reach just past knee level (shin) but is intermittently standing 5-10 mins depending on knee pain    Time 4    Period Weeks    Status Partially Met      PT SHORT TERM GOAL #4   Title Pt will ambulate x 100' with RW at S level in simulated household environment to indicate improved mobility at home.    Baseline met 03/23/20 with wide RW (recommend S definitely with regular RW)    Time 4    Period Weeks    Status Achieved      PT SHORT TERM GOAL #5   Title Pt will improve gait speed to >/=.92 ft/sec w/ RW and prosthesis to indicate dec fall risk.    Baseline 1.45 ft/sec  with wide RW 03/23/20    Time 4    Period Weeks    Status Achieved             PT Long Term Goals - 02/24/20 1257      PT LONG TERM GOAL #1   Title Pt will be independent with final HEP in order to indicate improved functional mobility and balance with use of prosthesis.  (Target Date: 04/12/20)    Time 8    Period Weeks    Status New      PT LONG TERM GOAL #2   Title Pt will be able to don prosthesis at mod I level and tolerate wearing for 90% of awake hours without skin breakdown and with good understanding of swelling mangement.    Time 8    Period Weeks    Status New      PT LONG TERM GOAL #3   Title Pt  will tolerate standing x 15 mins with intermittent UE support, 10" forward reach and ability to pick up item from floor with UE support at S level.    Time 8    Period Weeks    Status New      PT LONG TERM GOAL #4   Title Pt will improve gait speed to >/=1.52 ft/sec with LRAD and prosthesis to indicate dec fall risk.    Time 8    Period Weeks    Status Revised      PT LONG TERM GOAL #5   Title Pt will ambulate 300' over unlevel outdoor surfaces with RW and prosthesis at S level in order to indicate improved community mobility.    Time 8    Period Weeks    Status Revised                 Plan - 04/02/20 1422    Clinical Impression Statement Skilled session focused on gait training (both in // bars and with RW) and sit<>stand to further engage prosthetic knee.  She had several adjustments made and is doing much better with gait.  Will continue to benefit from skilled PT to progress and reduce deficits/fall risk.    Personal Factors and Comorbidities Comorbidity 3+;Past/Current Experience    Comorbidities see above    Examination-Activity Limitations Bend;Carry;Dressing;Stand;Stairs;Squat;Locomotion Level;Transfers    Examination-Participation Restrictions Community Activity    Stability/Clinical Decision Making Evolving/Moderate complexity    Rehab Potential  Good    PT Frequency 2x / week    PT Duration 8 weeks    PT Treatment/Interventions ADLs/Self Care Home Management;DME Instruction;Gait training;Stair training;Functional mobility training;Therapeutic activities;Therapeutic exercise;Balance training;Neuromuscular re-education;Patient/family education;Prosthetic Training;Compression bandaging;Passive range of motion;Energy conservation    PT Next Visit Plan Continue with standing balance until knee is adjusted, sit<>stand, gait with knee flexion-having her ride prosthesis all the way over stance, dynamic balance with decreasing UE support, standing strengthening (if unable to tolerate due to knee pain, give supine strengthening),  if able to tolerate wearing 2hours, 2x/day, then up to 3hrs, 2x/day    PT Home Exercise Plan Will plan to check goals by 11/21 and resubmit for more visits    Consulted and Agree with Plan of Care Patient           Patient will benefit from skilled therapeutic intervention in order to improve the following deficits and impairments:  Abnormal gait, Decreased activity tolerance, Decreased balance, Decreased endurance, Decreased knowledge of precautions, Decreased knowledge of use of DME, Decreased mobility, Decreased range of motion, Decreased strength, Difficulty walking, Increased edema, Impaired perceived functional ability, Impaired flexibility, Impaired sensation, Postural dysfunction, Improper body mechanics, Prosthetic Dependency  Visit Diagnosis: Unsteadiness on feet  Other abnormalities of gait and mobility  Abnormal posture  Repeated falls     Problem List Patient Active Problem List   Diagnosis Date Noted  . Chronic pain syndrome 01/16/2020  . Hx of AKA (above knee amputation), left ([place])   . Symptomatic anemia 10/21/2019  . Anemia 10/21/2019  . Acute respiratory failure with hypoxia ([place]) 10/21/2019  . Blurry vision, right eye 10/08/2019  . Tobacco use 03/01/2019  . Phantom limb pain ([place])  11/08/2018  . Current moderate episode of major depressive disorder ([place])   . Hyperkalemia   . Anemia of chronic disease   . Severe protein-calorie malnutrition ([place])   . Fall   . Status post below knee amputation, left ([place]) 04/11/2017  . Bipolar affective disorder ([place])   . Heart  failure with preserved ejection fraction ([place]) 02/09/2017  . Routine adult health maintenance 12/08/2016  . Chronic pain of right knee 09/08/2016  . Chronic kidney disease (CKD), stage III (moderate) ([place]) 07/15/2016  . OSA (obstructive sleep apnea) 05/13/2016  . Morbid obesity due to excess calories ([place]) 11/27/2015  . Hypothyroidism 04/25/2013  . Controlled type 2 diabetes mellitus ([place]) 07/12/2012  . Carpal tunnel syndrome, bilateral 01/25/2012  . Diabetic polyneuropathy associated with type 2 diabetes mellitus ([place]) 07/04/2011  . Anxiety and depression 06/02/2011  . GERD 12/06/2007  . Hyperlipidemia 08/29/2007  . Hypertension associated with diabetes ([place]) 08/29/2007    Cameron Sprang, PT, MPT [facility] [address] [place], [place], [address] Phone: [phone]   Fax:  [phone] 04/02/20, 2:27 PM  Name: [name] [name] MRN: [id number] Date of Birth: [birth year]

## 2020-04-06 ENCOUNTER — Ambulatory Visit: Payer: Medicare HMO | Admitting: Rehabilitation

## 2020-04-06 ENCOUNTER — Encounter: Payer: Self-pay | Admitting: Rehabilitation

## 2020-04-06 ENCOUNTER — Other Ambulatory Visit: Payer: Self-pay

## 2020-04-06 DIAGNOSIS — R2689 Other abnormalities of gait and mobility: Secondary | ICD-10-CM | POA: Diagnosis not present

## 2020-04-06 DIAGNOSIS — R2681 Unsteadiness on feet: Secondary | ICD-10-CM

## 2020-04-06 DIAGNOSIS — R293 Abnormal posture: Secondary | ICD-10-CM | POA: Diagnosis not present

## 2020-04-06 DIAGNOSIS — R296 Repeated falls: Secondary | ICD-10-CM | POA: Diagnosis not present

## 2020-04-06 DIAGNOSIS — M25652 Stiffness of left hip, not elsewhere classified: Secondary | ICD-10-CM | POA: Diagnosis not present

## 2020-04-06 NOTE — Therapy (Signed)
[facility] [address] [place], [place], [address] Phone: [phone]   Fax:  [phone]  Physical Therapy Treatment and Progress Note  Patient Details  Name: [name] [name] MRN: [id number] Date of Birth: [birth year] Referring Provider (PT): Meridee Score, MD   Encounter Date: 04/06/2020   PT End of Session - 04/06/20 1313    Visit Number 10    Number of Visits 17    Date for PT Re-Evaluation 04/12/20    Authorization Type [employer] and Medicaid (10th visit needed)    Authorization Time Period 9/22-11/21    Progress Note Due on Visit 10    PT Start Time 1115   transportation late   PT Stop Time 1147    PT Time Calculation (min) 32 min    Activity Tolerance Patient limited by pain;Treatment limited secondary to medical complications (Comment)    Behavior During Therapy [facility] for tasks assessed/performed           Past Medical History:  Diagnosis Date   Abdominal muscle pain 09/08/2016   Abnormal Pap smear of cervix 2009   Abscess    history of multiple abscesses   Acute bilateral low back pain 02/13/2017   Acute blood loss anemia    Acute on chronic renal failure ([place]) 07/12/2012   Acute renal failure ([place]) 07/12/2012   Adjustment disorder with depressed mood 03/26/2017   AKI (acute kidney injury) ([place])    Anemia of chronic disease 2002   Anxiety    Panic attacks   Bilateral lower extremity edema 05/13/2016   Bipolar disorder ([place])    Cataract    B/L cataract   Cellulitis 05/21/2014   right eye   Chronic bronchitis ([place])    "get it q yr" (05/13/2013)   Chronic pain    Dehiscence of amputation stump (HCC)    Depression    Edema of lower extremity    Endocarditis 2002   subacute bacterial endocarditis.    Family history of anesthesia complication    "my mom has a hard time coming out from under"   Fibromyalgia    GERD (gastroesophageal reflux disease)    occ   Heart  murmur    Herpes simplex type 1 infection 01/16/2018   History of blood transfusion    "just low blood count" (05/13/2013)   Hyperlipidemia    Hypertension    Hypothyroidism    Hypothyroidism, adult 03/21/2014   Hypoxia    Leukocytosis    Necrosis (HCC)    and ulceration   Necrotizing fasciitis s/p OR debridements 07/06/2012   Obesity    OSA on CPAP    does not wear all the time   Peripheral neuropathy    Pneumonia    Type II diabetes mellitus ([place])    Type  II   Wound dehiscence 09/04/2019    Past Surgical History:  Procedure Laterality Date   ABDOMINAL AORTOGRAM W/LOWER EXTREMITY Left 03/19/2019   Procedure: ABDOMINAL AORTOGRAM W/LOWER EXTREMITY;  Surgeon: Serafina Mitchell, MD;  Location: [facility];  Service: Cardiovascular;  Laterality: Left;   ABOVE KNEE LEG AMPUTATION Left 10/11/2019   wound dehisence    AIR/FLUID EXCHANGE Right 12/31/2019   Procedure: AIR/FLUID EXCHANGE;  Surgeon: Jalene Mullet, MD;  Location: [facility];  Service: Ophthalmology;  Laterality: Right;   AMPUTATION Left 04/07/2017   Procedure: LEFT BELOW KNEE AMPUTATION;  Surgeon: Newt Minion, MD;  Location: [facility];  Service: Orthopedics;  Laterality: Left;  AMPUTATION Left 10/11/2019   Procedure: LEFT ABOVE KNEE AMPUTATION;  Surgeon: Newt Minion, MD;  Location: [facility];  Service: Orthopedics;  Laterality: Left;   CATARACT EXTRACTION W/PHACO Right 02/11/2020   Procedure: CATARACT EXTRACTION PHACO AND INTRAOCULAR LENS PLACEMENT (IOC);  Surgeon: Jalene Mullet, MD;  Location: [facility];  Service: Ophthalmology;  Laterality: Right;   CORONARY ARTERY BYPASS GRAFT     EYE SURGERY     lazer   GAS INSERTION Right 12/31/2019   Procedure: INSERTION OF GAS;  Surgeon: Jalene Mullet, MD;  Location: [facility];  Service: Ophthalmology;  Laterality: Right;   INCISION AND DRAINAGE ABSCESS     multiple I&Ds   INCISION AND DRAINAGE ABSCESS Left 07/09/2012   Procedure: DRESSING CHANGE, THIGH  WOUND;  Surgeon: Harl Bowie, MD;  Location: [facility];  Service: General;  Laterality: Left;   INCISION AND DRAINAGE OF WOUND Left 07/07/2012   Procedure: IRRIGATION AND DEBRIDEMENT WOUND;  Surgeon: Harl Bowie, MD;  Location: [facility];  Service: General;  Laterality: Left;   INCISION AND DRAINAGE PERIRECTAL ABSCESS Left 07/14/2012   Procedure: DEBRIDEMENT OF SKIN & SOFT TISSUE; DRESSING CHANGE UNDER ANESTHESIA;  Surgeon: Gayland Curry, MD,FACS;  Location: [facility];  Service: General;  Laterality: Left;   INCISION AND DRAINAGE PERIRECTAL ABSCESS Left 07/16/2012   Procedure: I&D Left Thigh;  Surgeon: Gwenyth Ober, MD;  Location: [facility];  Service: General;  Laterality: Left;   INCISION AND DRAINAGE PERIRECTAL ABSCESS N/A 01/05/2015   Procedure: IRRIGATION AND DEBRIDEMENT PERIRECTAL ABSCESS;  Surgeon: Donnie Mesa, MD;  Location: [facility];  Service: General;  Laterality: N/A;   IRRIGATION AND DEBRIDEMENT ABSCESS Left 07/06/2012   Procedure: IRRIGATION AND DEBRIDEMENT ABSCESS BUTTOCKS AND THIGH;  Surgeon: Shann Medal, MD;  Location: [facility];  Service: General;  Laterality: Left;   IRRIGATION AND DEBRIDEMENT ABSCESS Left 08/10/2012   Procedure: IRRIGATION AND DEBRIDEMENT ABSCESS;  Surgeon: Madilyn Hook, DO;  Location: [facility];  Service: General;  Laterality: Left;   PARS PLANA VITRECTOMY Right 12/31/2019   Procedure: PARS PLANA VITRECTOMY WITH 25 GAUGE;  Surgeon: Jalene Mullet, MD;  Location: [facility];  Service: Ophthalmology;  Laterality: Right;   PHOTOCOAGULATION WITH LASER Right 12/31/2019   Procedure: PHOTOCOAGULATION WITH LASER;  Surgeon: Jalene Mullet, MD;  Location: [facility];  Service: Ophthalmology;  Laterality: Right;   REPAIR OF COMPLEX TRACTION RETINAL DETACHMENT Right 12/31/2019   Procedure: REPAIR OF COMPLEX TRACTION RETINAL;  Surgeon: Jalene Mullet, MD;  Location: [facility];  Service: Ophthalmology;  Laterality: Right;   STUMP REVISION Left 06/09/2017   Procedure: REVISION LEFT BELOW KNEE  AMPUTATION;  Surgeon: Newt Minion, MD;  Location: [facility];  Service: Orthopedics;  Laterality: Left;   STUMP REVISION Left 09/04/2019   Procedure: LEFT BELOW KNEE AMPUTATION REVISION;  Surgeon: Newt Minion, MD;  Location: [facility];  Service: Orthopedics;  Laterality: Left;   STUMP REVISION Left 09/20/2019   Procedure: LEFT BELOW KNEE AMPUTATION REVISION;  Surgeon: Newt Minion, MD;  Location: [facility];  Service: Orthopedics;  Laterality: Left;   TONSILLECTOMY  1994    There were no vitals filed for this visit.   Subjective Assessment - 04/06/20 1124    Subjective Pt late due to transportation which is happening consistently.    Patient Stated Goals "I want to be able to walk on this leg and move around"    Currently in Pain? Yes    Pain Score 8     Pain  Location Knee    Pain Orientation Right    Pain Descriptors / Indicators Aching    Pain Type Chronic pain    Pain Onset More than a month ago    Pain Frequency Intermittent    Aggravating Factors  standing/walking for longer periods    Pain Relieving Factors heat/medication                             [facility] Adult PT Treatment/Exercise - 04/06/20 1304      Transfers   Transfers Sit to Stand;Stand to Sit    Sit to Stand 5: Supervision    Sit to Stand Details Visual cues/gestures for precautions/safety;Visual cues/gestures for sequencing;Verbal cues for sequencing    Sit to Stand Details (indicate cue type and reason) continue to provide cues for safe standing     Stand to Sit 5: Supervision    Stand to Sit Details (indicate cue type and reason) Verbal cues for sequencing;Verbal cues for technique;Verbal cues for precautions/safety    Stand to Sit Details cues for continued WB through prosthesis when sitting.       Ambulation/Gait   Ambulation/Gait Yes    Ambulation/Gait Assistance 5: Supervision    Ambulation/Gait Assistance Details Pt has not heard from [employer] yet about RW, will call today to ensure they  received fax.  She ambulated into clinic with her standard RW at S level.  From outside she does not demonstrate knee flex in prosthesis due to fear of falling and needs continued cues to flex knee when inside clinic.  She does intermittently do circumduction and IR to clear L prosthesis but improves with cues.     Ambulation Distance (Feet) 200 Feet    Assistive device Rolling walker;Prosthesis;Parallel bars    Gait Pattern Step-to pattern;Decreased stride length;Lateral hip instability;Lateral trunk lean to right;Trunk flexed;Wide base of support    Ambulation Surface Level;Indoor    Gait velocity .36 ft/sec with wide RW and with sucessful knee flexion in prosthesis throughout.     Ramp 4: Min assist    Ramp Details (indicate cue type and reason) Performed ramp to further engage L prosthetic knee.  She was very anxious about descending ramp, however with cues for "riding leg through stance" she is able to control knee flexion and did not have any abrupt buckle of knee.       Self-Care   Self-Care Other Self-Care Comments    Other Self-Care Comments  Lengthy discussion with pt regarding transportation issues (of which are not her fault as they also have been calling clinic to confirm her appt times).  Discussed limitations due to R knee pain and getting second orthopedic opinion on R knee.  Discussed that PTs concern with any knee surgery would be her ability to ambulate more on prosthesis and while pt does not fully trust prosthesis, this would be unsafe at this time.  She also discussed trying to lose weight.  Educated on eating frequent appopriate small meals throughout day and drinking 64-80 oz water per day. Pt verbalized understanding.  Also discussed possible pool therapy to work on strengthening and cardiovascular endurance in safer and more pain free environmnet.  She report she would try.  PT to get with aquatic PT to discuss.  Ended discussion with planning to continue therapy for another 8  weeks to work towards goals.  She would like to move to a LRAD, however due to R knee pain, feel this  will be difficult at this time.       Prosthetics   Prosthetic Care Comments  She is to begin wearing prosthesis 3 hrs, 2x/day today as she has not been doing so since last visit.     Current prosthetic wear tolerance (days/week)  daily     Current prosthetic wear tolerance (#hours/day)  2 hrs, 2x/day     Current prosthetic weight-bearing tolerance (hours/day)  5-10 WB, but limited by R knee pain     Edema none     Residual limb condition  Pt reports no skin issues    Education Provided Proper wear schedule/adjustment;Proper weight-bearing schedule/adjustment    Person(s) Educated Patient    Education Method Explanation;Verbal cues    Education Method Verbalized understanding;Needs further instruction    Donning Prosthesis Modified independent (device/increased time)                    PT Short Term Goals - 03/23/20 1111      PT SHORT TERM GOAL #1   Title Pt will be independent with initial standing HEP with prosthesis.   (Target Date: 03/13/20)    Baseline met per pt report    Time 4    Period Weeks    Status Achieved    Target Date 03/13/20      PT SHORT TERM GOAL #2   Title Pt will be able to don prosthesis at S level and tolerate wearing prosthesis 5 hrs, 2x/day with no skin issues and with good understanding of swelling management.    Baseline consistently 2hours, twice a day    Time 4    Period Weeks    Status Not Met      PT SHORT TERM GOAL #3   Title Pt will perform forward reach 5", reach down to knee level, and perform dynamic tasks with intermittent UE support x 10 mins at S level.    Baseline is able to reach forward 5", reach just past knee level (shin) but is intermittently standing 5-10 mins depending on knee pain    Time 4    Period Weeks    Status Partially Met      PT SHORT TERM GOAL #4   Title Pt will ambulate x 100' with RW at S level in  simulated household environment to indicate improved mobility at home.    Baseline met 03/23/20 with wide RW (recommend S definitely with regular RW)    Time 4    Period Weeks    Status Achieved      PT SHORT TERM GOAL #5   Title Pt will improve gait speed to >/=.92 ft/sec w/ RW and prosthesis to indicate dec fall risk.    Baseline 1.45 ft/sec with wide RW 03/23/20    Time 4    Period Weeks    Status Achieved             PT Long Term Goals - 04/06/20 1125      PT LONG TERM GOAL #1   Title Pt will be independent with final HEP in order to indicate improved functional mobility and balance with use of prosthesis.  (Target Date: 04/12/20)    Time 8    Period Weeks    Status New      PT LONG TERM GOAL #2   Title Pt will be able to don prosthesis at mod I level and tolerate wearing for 90% of awake hours without skin breakdown and with good understanding of  swelling mangement.    Baseline Just started 3hrs, 2x/day on 04/06/20, is donning leg independently    Time 8    Period Weeks    Status Partially Met      PT LONG TERM GOAL #3   Title Pt will tolerate standing x 15 mins with intermittent UE support, 10" forward reach and ability to pick up item from floor with UE support at S level.    Time 8    Period Weeks    Status New      PT LONG TERM GOAL #4   Title Pt will improve gait speed to >/=1.52 ft/sec with LRAD and prosthesis to indicate dec fall risk.    Baseline .48 ft/sec with RW at S level successfully flexing knee throughout gait    Time 8    Period Weeks    Status Not Met      PT LONG TERM GOAL #5   Title Pt will ambulate 300' over unlevel outdoor surfaces with RW and prosthesis at S level in order to indicate improved community mobility.    Time 8    Period Weeks    Status Revised             Progress Note Reporting Period 02/13/20 to 04/06/20  See note below for Objective Data and Assessment of Progress/Goals.  See 2 LTGs that were checked during session.            Plan - 04/06/20 1314    Clinical Impression Statement Session focused on lengthy discussion regarding transportation issues, limitations due to R knee pain and seeking MD help to manage, and continuing therapy another 8 weeks.  Did check 2 LTGs today, both of which not met due to dec gait speed and not consistently wearing leg.  Pt will continue to benefit from skilled PT therefore will add 8 more weeks to POC and edit goals on Thursday.    Personal Factors and Comorbidities Comorbidity 3+;Past/Current Experience    Comorbidities see above    Examination-Activity Limitations Bend;Carry;Dressing;Stand;Stairs;Squat;Locomotion Level;Transfers    Examination-Participation Restrictions Community Activity    Stability/Clinical Decision Making Evolving/Moderate complexity    Rehab Potential Good    PT Frequency 2x / week    PT Duration 8 weeks    PT Treatment/Interventions ADLs/Self Care Home Management;DME Instruction;Gait training;Stair training;Functional mobility training;Therapeutic activities;Therapeutic exercise;Balance training;Neuromuscular re-education;Patient/family education;Prosthetic Training;Compression bandaging;Passive range of motion;Energy conservation    PT Next Visit Plan check LTGs and recert/request more auth, Continue with standing balance until knee is adjusted, sit<>stand, gait with knee flexion-having her ride prosthesis all the way over stance, dynamic balance with decreasing UE support, standing strengthening (if unable to tolerate due to knee pain, give supine strengthening),  if able to tolerate wearing 2hours, 2x/day, then up to 3hrs, 2x/day    PT Home Exercise Plan Will plan to check goals by 11/21 and resubmit for more visits    Consulted and Agree with Plan of Care Patient           Patient will benefit from skilled therapeutic intervention in order to improve the following deficits and impairments:  Abnormal gait, Decreased activity tolerance,  Decreased balance, Decreased endurance, Decreased knowledge of precautions, Decreased knowledge of use of DME, Decreased mobility, Decreased range of motion, Decreased strength, Difficulty walking, Increased edema, Impaired perceived functional ability, Impaired flexibility, Impaired sensation, Postural dysfunction, Improper body mechanics, Prosthetic Dependency  Visit Diagnosis: Unsteadiness on feet  Other abnormalities of gait and mobility  Abnormal posture  Problem List Patient Active Problem List   Diagnosis Date Noted   Chronic pain syndrome 01/16/2020   Hx of AKA (above knee amputation), left (HCC)    Symptomatic anemia 10/21/2019   Anemia 10/21/2019   Acute respiratory failure with hypoxia ([place]) 10/21/2019   Blurry vision, right eye 10/08/2019   Tobacco use 03/01/2019   Phantom limb pain ([place]) 11/08/2018   Current moderate episode of major depressive disorder ([place])    Hyperkalemia    Anemia of chronic disease    Severe protein-calorie malnutrition ([place])    Fall    Status post below knee amputation, left ([place]) 04/11/2017   Bipolar affective disorder ([place])    Heart failure with preserved ejection fraction ([place]) 02/09/2017   Routine adult health maintenance 12/08/2016   Chronic pain of right knee 09/08/2016   Chronic kidney disease (CKD), stage III (moderate) ([place]) 07/15/2016   OSA (obstructive sleep apnea) 05/13/2016   Morbid obesity due to excess calories ([place]) 11/27/2015   Hypothyroidism 04/25/2013   Controlled type 2 diabetes mellitus ([place]) 07/12/2012   Carpal tunnel syndrome, bilateral 01/25/2012   Diabetic polyneuropathy associated with type 2 diabetes mellitus ([place]) 07/04/2011   Anxiety and depression 06/02/2011   GERD 12/06/2007   Hyperlipidemia 08/29/2007   Hypertension associated with diabetes ([place]) 08/29/2007    Cameron Sprang, PT, MPT [facility] [address] [place], [place],  [address] Phone: [phone]   Fax:  [phone] 04/06/20, 3:11 PM  Name: [name] [name] MRN: [id number] Date of Birth: 1976/01/27

## 2020-04-09 ENCOUNTER — Other Ambulatory Visit: Payer: Self-pay

## 2020-04-09 ENCOUNTER — Encounter: Payer: Self-pay | Admitting: Rehabilitation

## 2020-04-09 ENCOUNTER — Ambulatory Visit: Payer: Medicare HMO | Admitting: Rehabilitation

## 2020-04-09 DIAGNOSIS — R293 Abnormal posture: Secondary | ICD-10-CM

## 2020-04-09 DIAGNOSIS — R2681 Unsteadiness on feet: Secondary | ICD-10-CM

## 2020-04-09 DIAGNOSIS — R2689 Other abnormalities of gait and mobility: Secondary | ICD-10-CM | POA: Diagnosis not present

## 2020-04-09 DIAGNOSIS — R296 Repeated falls: Secondary | ICD-10-CM

## 2020-04-09 DIAGNOSIS — M25652 Stiffness of left hip, not elsewhere classified: Secondary | ICD-10-CM

## 2020-04-09 NOTE — Therapy (Signed)
[facility] [address] [place], [place], [address] Phone: [phone]   Fax:  [phone]  Physical Therapy Treatment  Patient Details  Name: [name] [name] MRN: [id number] Date of Birth: 01-Oct-1975 Referring Provider (PT): Meridee Score, MD   Encounter Date: 04/09/2020   PT End of Session - 04/09/20 1340    Visit Number 11    Number of Visits 17    Date for PT Re-Evaluation 04/12/20    Authorization Type [employer] and Medicaid (10th visit needed)    Authorization Time Period 9/22-11/21 (awaiting new approval)    Progress Note Due on Visit 20    PT Start Time 1015    PT Stop Time 1100    PT Time Calculation (min) 45 min    Activity Tolerance Patient limited by pain;Treatment limited secondary to medical complications (Comment)    Behavior During Therapy [facility] for tasks assessed/performed           Past Medical History:  Diagnosis Date  . Abdominal muscle pain 09/08/2016  . Abnormal Pap smear of cervix 2009  . Abscess    history of multiple abscesses  . Acute bilateral low back pain 02/13/2017  . Acute blood loss anemia   . Acute on chronic renal failure ([place]) 07/12/2012  . Acute renal failure ([place]) 07/12/2012  . Adjustment disorder with depressed mood 03/26/2017  . AKI (acute kidney injury) ([place])   . Anemia of chronic disease 2002  . Anxiety    Panic attacks  . Bilateral lower extremity edema 05/13/2016  . Bipolar disorder ([place])   . Cataract    B/L cataract  . Cellulitis 05/21/2014   right eye  . Chronic bronchitis ([place])    "get it q yr" (05/13/2013)  . Chronic pain   . Dehiscence of amputation stump ([place])   . Depression   . Edema of lower extremity   . Endocarditis 2002   subacute bacterial endocarditis.   . Family history of anesthesia complication    "my mom has a hard time coming out from under"  . Fibromyalgia   . GERD (gastroesophageal reflux disease)    occ  . Heart murmur   . Herpes  simplex type 1 infection 01/16/2018  . History of blood transfusion    "just low blood count" (05/13/2013)  . Hyperlipidemia   . Hypertension   . Hypothyroidism   . Hypothyroidism, adult 03/21/2014  . Hypoxia   . Leukocytosis   . Necrosis ([place])    and ulceration  . Necrotizing fasciitis s/p OR debridements 07/06/2012  . Obesity   . OSA on CPAP    does not wear all the time  . Peripheral neuropathy   . Pneumonia   . Type II diabetes mellitus (HCC)    Type  II  . Wound dehiscence 09/04/2019    Past Surgical History:  Procedure Laterality Date  . ABDOMINAL AORTOGRAM W/LOWER EXTREMITY Left 03/19/2019   Procedure: ABDOMINAL AORTOGRAM W/LOWER EXTREMITY;  Surgeon: Serafina Mitchell, MD;  Location: [facility];  Service: Cardiovascular;  Laterality: Left;  . ABOVE KNEE LEG AMPUTATION Left 10/11/2019   wound dehisence   . AIR/FLUID EXCHANGE Right 12/31/2019   Procedure: AIR/FLUID EXCHANGE;  Surgeon: Jalene Mullet, MD;  Location: [facility];  Service: Ophthalmology;  Laterality: Right;  . AMPUTATION Left 04/07/2017   Procedure: LEFT BELOW KNEE AMPUTATION;  Surgeon: Newt Minion, MD;  Location: [facility];  Service: Orthopedics;  Laterality: Left;  . AMPUTATION Left  10/11/2019   Procedure: LEFT ABOVE KNEE AMPUTATION;  Surgeon: Newt Minion, MD;  Location: [facility];  Service: Orthopedics;  Laterality: Left;  . CATARACT EXTRACTION W/PHACO Right 02/11/2020   Procedure: CATARACT EXTRACTION PHACO AND INTRAOCULAR LENS PLACEMENT (IOC);  Surgeon: Jalene Mullet, MD;  Location: [facility];  Service: Ophthalmology;  Laterality: Right;  . CORONARY ARTERY BYPASS GRAFT    . EYE SURGERY     l[place]  . GAS INSERTION Right 12/31/2019   Procedure: INSERTION OF GAS;  Surgeon: Jalene Mullet, MD;  Location: [facility];  Service: Ophthalmology;  Laterality: Right;  . INCISION AND DRAINAGE ABSCESS     multiple I&Ds  . INCISION AND DRAINAGE ABSCESS Left 07/09/2012   Procedure: DRESSING CHANGE, THIGH WOUND;  Surgeon: Harl Bowie, MD;  Location: [facility];  Service: General;  Laterality: Left;  . INCISION AND DRAINAGE OF WOUND Left 07/07/2012   Procedure: IRRIGATION AND DEBRIDEMENT WOUND;  Surgeon: Harl Bowie, MD;  Location: [facility];  Service: General;  Laterality: Left;  . INCISION AND DRAINAGE PERIRECTAL ABSCESS Left 07/14/2012   Procedure: DEBRIDEMENT OF SKIN & SOFT TISSUE; DRESSING CHANGE UNDER ANESTHESIA;  Surgeon: Gayland Curry, MD,FACS;  Location: [facility];  Service: General;  Laterality: Left;  . INCISION AND DRAINAGE PERIRECTAL ABSCESS Left 07/16/2012   Procedure: I&D Left Thigh;  Surgeon: Gwenyth Ober, MD;  Location: [facility] [place];  Service: General;  Laterality: Left;  . INCISION AND DRAINAGE PERIRECTAL ABSCESS N/A 01/05/2015   Procedure: IRRIGATION AND DEBRIDEMENT PERIRECTAL ABSCESS;  Surgeon: Donnie Mesa, MD;  Location: [facility];  Service: General;  Laterality: N/A;  . IRRIGATION AND DEBRIDEMENT ABSCESS Left 07/06/2012   Procedure: IRRIGATION AND DEBRIDEMENT ABSCESS BUTTOCKS AND THIGH;  Surgeon: Shann Medal, MD;  Location: [facility];  Service: General;  Laterality: Left;  . IRRIGATION AND DEBRIDEMENT ABSCESS Left 08/10/2012   Procedure: IRRIGATION AND DEBRIDEMENT ABSCESS;  Surgeon: Madilyn Hook, DO;  Location: [facility];  Service: General;  Laterality: Left;  . PARS PLANA VITRECTOMY Right 12/31/2019   Procedure: PARS PLANA VITRECTOMY WITH 25 GAUGE;  Surgeon: Jalene Mullet, MD;  Location: [facility];  Service: Ophthalmology;  Laterality: Right;  . PHOTOCOAGULATION WITH LASER Right 12/31/2019   Procedure: PHOTOCOAGULATION WITH LASER;  Surgeon: Jalene Mullet, MD;  Location: [facility];  Service: Ophthalmology;  Laterality: Right;  . REPAIR OF COMPLEX TRACTION RETINAL DETACHMENT Right 12/31/2019   Procedure: REPAIR OF COMPLEX TRACTION RETINAL;  Surgeon: Jalene Mullet, MD;  Location: [facility];  Service: Ophthalmology;  Laterality: Right;  . STUMP REVISION Left 06/09/2017   Procedure: REVISION LEFT BELOW KNEE AMPUTATION;  Surgeon:  Newt Minion, MD;  Location: [facility];  Service: Orthopedics;  Laterality: Left;  . STUMP REVISION Left 09/04/2019   Procedure: LEFT BELOW KNEE AMPUTATION REVISION;  Surgeon: Newt Minion, MD;  Location: [facility];  Service: Orthopedics;  Laterality: Left;  . STUMP REVISION Left 09/20/2019   Procedure: LEFT BELOW KNEE AMPUTATION REVISION;  Surgeon: Newt Minion, MD;  Location: [facility];  Service: Orthopedics;  Laterality: Left;  . TONSILLECTOMY  1994    There were no vitals filed for this visit.   Subjective Assessment - 04/09/20 1019    Subjective Pt with pain in R knee and also outside of R hip    Limitations Walking;House hold activities;Standing    Patient Stated Goals "I want to be able to walk on this leg and move around"    Currently in Pain? Yes  Pain Score 7     Pain Location Hip    Pain Orientation Right    Pain Descriptors / Indicators Aching    Pain Type Chronic pain    Pain Radiating Towards into knee    Pain Onset More than a month ago    Pain Frequency Intermittent    Aggravating Factors  standing/walking for long periods    Pain Relieving Factors heat/medication                             [facility] Adult PT Treatment/Exercise - 04/09/20 1052      Transfers   Transfers Sit to Stand;Stand to Sit    Sit to Stand 5: Supervision    Sit to Stand Details Visual cues/gestures for precautions/safety;Visual cues/gestures for sequencing;Verbal cues for sequencing    Stand to Sit 5: Supervision    Stand to Sit Details (indicate cue type and reason) Verbal cues for sequencing;Verbal cues for technique;Verbal cues for precautions/safety      Ambulation/Gait   Ambulation/Gait Yes    Ambulation/Gait Assistance 5: Supervision;4: Min assist    Ambulation/Gait Assistance Details Pt ambulatory with wide RW and prosthesis into gym at S to min/guard level with cues for posture and increased time spent on L prosthesis in order to allow knee to flex at end of stance phase.   Went to // bars in order to work on gait with single UE support x 6 reps with continued cues for forward flexion of prosthesis rather than circumduction.  Also cues for upright posture.  She does need continued cues and demo to remain on prosthesis longer during stance to allow flexion.  Ended with use of SBQC in // bars (first with opposite UE support then only cane) x 2 sets of 4 laps with min A and continued cues for posture, utilizing mirror for posture and weight shift.  Pt has difficulty with placement at times due to LE alignment.      Ambulation Distance (Feet) 115 Feet   plus laps in // bars   Assistive device Rolling walker;Prosthesis;Parallel bars;Small based quad cane    Gait Pattern Step-to pattern;Decreased stride length;Lateral hip instability;Lateral trunk lean to right;Trunk flexed;Wide base of support    Ambulation Surface Level;Indoor      Exercises   Exercises Other Exercises    Other Exercises  Supine L hip flex stretch with L prosthesis off edge of mat x 2 mins.  Attempted in R SL however due to weight of leg/prosthesis, difficult to get adequate stretch.  Did address R hip pain during session with light R hip IR stretch x 2 reps of 30 secs.  Does not seem to be a bursitis pain but just tight muscles likely due to gait compensations.        Prosthetics   Prosthetic Care Comments  Continued education to wear leg 3 hrs, 2x/day     Current prosthetic wear tolerance (days/week)  daily     Current prosthetic wear tolerance (#hours/day)  3 hours once, then 2 hours    Current prosthetic weight-bearing tolerance (hours/day)  5-10 WB, but limited by R knee pain     Edema none     Residual limb condition  Pt reports no skin issues    Education Provided Proper wear schedule/adjustment;Proper weight-bearing schedule/adjustment    Person(s) Educated Patient    Education Method Explanation;Verbal cues    Education Method Verbalized understanding;Needs further instruction  Donning  Prosthesis Modified independent (device/increased time)                  PT Education - 04/09/20 1339    Education Details Educated on adding to [place] for 8 more weeks    Person(s) Educated Patient    Methods Explanation    Comprehension Verbalized understanding            PT Short Term Goals - 03/23/20 1111      PT SHORT TERM GOAL #1   Title Pt will be independent with initial standing HEP with prosthesis.   (Target Date: 03/13/20)    Baseline met per pt report    Time 4    Period Weeks    Status Achieved    Target Date 03/13/20      PT SHORT TERM GOAL #2   Title Pt will be able to don prosthesis at S level and tolerate wearing prosthesis 5 hrs, 2x/day with no skin issues and with good understanding of swelling management.    Baseline consistently 2hours, twice a day    Time 4    Period Weeks    Status Not Met      PT SHORT TERM GOAL #3   Title Pt will perform forward reach 5", reach down to knee level, and perform dynamic tasks with intermittent UE support x 10 mins at S level.    Baseline is able to reach forward 5", reach just past knee level (shin) but is intermittently standing 5-10 mins depending on knee pain    Time 4    Period Weeks    Status Partially Met      PT SHORT TERM GOAL #4   Title Pt will ambulate x 100' with RW at S level in simulated household environment to indicate improved mobility at home.    Baseline met 03/23/20 with wide RW (recommend S definitely with regular RW)    Time 4    Period Weeks    Status Achieved      PT SHORT TERM GOAL #5   Title Pt will improve gait speed to >/=.92 ft/sec w/ RW and prosthesis to indicate dec fall risk.    Baseline 1.45 ft/sec with wide RW 03/23/20    Time 4    Period Weeks    Status Achieved             PT Long Term Goals - 04/09/20 1341      PT LONG TERM GOAL #1   Title Pt will be independent with final HEP in order to indicate improved functional mobility and balance with use of prosthesis.   (Target Date: 04/12/20)    Baseline inconsistently performing    Time 8    Period Weeks    Status Partially Met      PT LONG TERM GOAL #2   Title Pt will be able to don prosthesis at mod I level and tolerate wearing for 90% of awake hours without skin breakdown and with good understanding of swelling mangement.    Baseline Just started 3hrs, 2x/day on 04/06/20, is donning leg independently    Time 8    Period Weeks    Status Partially Met      PT LONG TERM GOAL #3   Title Pt will tolerate standing x 15 mins with intermittent UE support, 10" forward reach and ability to pick up item from floor with UE support at S level.    Baseline is unable to stand for  15 mins (can stand for up to 10 mins at a time), can reach forward 10", unable to assess picking up item from floor    Time 8    Period Weeks    Status Partially Met      PT LONG TERM GOAL #4   Title Pt will improve gait speed to >/=1.52 ft/sec with LRAD and prosthesis to indicate dec fall risk.    Baseline .58 ft/sec with RW at S level successfully flexing knee throughout gait    Time 8    Period Weeks    Status Not Met      PT LONG TERM GOAL #5   Title Pt will ambulate 300' over unlevel outdoor surfaces with RW and prosthesis at S level in order to indicate improved community mobility.    Baseline did not have time to assess    Time 8    Period Weeks    Status On-going              PT Short Term Goals - 04/09/20 1346      PT SHORT TERM GOAL #1   Title Pt will be independent with ongoing standing HEP with prosthesis.   (Target Date: 05/09/20)    Baseline met per pt report    Time 4    Period Weeks    Status On-going    Target Date 05/09/20      PT SHORT TERM GOAL #2   Title Pt will be able to tolerate wearing prosthesis 5 hrs, 2x/day with no skin issues and with good understanding of swelling management.    Time 4    Period Weeks    Status Revised      PT SHORT TERM GOAL #3   Title Pt will  perform dynamic  tasks with intermittent UE support x 10 mins at S level and reach to floor level to retrieve item.    Time 4    Period Weeks    Status Revised      PT SHORT TERM GOAL #4   Title Pt will ambulate x 300' with RW at mod I level without rest break in order to indicate improved activity tolerance.    Time 4    Period Weeks    Status Revised      PT SHORT TERM GOAL #5   Title Pt will improve gait speed to >/=1.17 ft/sec w/ RW and prosthesis to indicate dec fall risk.    Time 4    Period Weeks    Status Revised           PT Long Term Goals - 04/09/20 1349      PT LONG TERM GOAL #1   Title Pt will be independent with final HEP in order to indicate improved functional mobility and balance with use of prosthesis.  (Target Date: 06/08/20)    Baseline inconsistently performing    Time 8    Period Weeks    Status On-going    Target Date 06/08/20      PT LONG TERM GOAL #2   Title Pt will be able to tolerate wearing for 90% of awake hours without skin breakdown and with good understanding of swelling mangement.    Baseline Just started 3hrs, 2x/day on 04/06/20, is donning leg independently    Time 8    Period Weeks    Status Revised      PT LONG TERM GOAL #3   Title Pt will tolerate standing x 15  mins while performing tasks intermittently without UE support and pick up item from floor with single UE support at S level.    Baseline is unable to stand for 15 mins (can stand for up to 10 mins at a time), can reach forward 10", unable to assess picking up item from floor    Time 8    Period Weeks    Status Revised      PT LONG TERM GOAL #4   Title Pt will improve gait speed to >/=1.37 ft/sec with LRAD and prosthesis to indicate dec fall risk.    Baseline .63 ft/sec with RW at S level successfully flexing knee throughout gait    Time 8    Period Weeks    Status Revised      PT LONG TERM GOAL #5   Title Pt will ambulate x 25' w/ SBQC (or LRAD) and prosthesis at min A level in order to  indicate improved independence with gait.    Time 8    Period Weeks    Status New                Plan - 04/09/20 1341    Clinical Impression Statement Pt has partially met 3/5 LTGs, but is really making great progress with gait with RW.  She still needs to obtain wide RW from [employer] (order has been faxed) to allow for improved gait and carryover at home.  She is now flexing knee with gait and mobilty more consistently and she would like to progress to LRAD.  Will add another 8 weeks to POC (2x/wk) in order to work towards remaining and new goals.    Personal Factors and Comorbidities Comorbidity 3+;Past/Current Experience    Comorbidities see above    Examination-Activity Limitations [place];Carry;Dressing;Stand;Stairs;Squat;Locomotion Level;Transfers    Examination-Participation Restrictions Community Activity    Stability/Clinical Decision Making Evolving/Moderate complexity    Rehab Potential Good    PT Frequency 2x / week    PT Duration 8 weeks    PT Treatment/Interventions ADLs/Self Care Home Management;DME Instruction;Gait training;Stair training;Functional mobility training;Therapeutic activities;Therapeutic exercise;Balance training;Neuromuscular re-education;Patient/family education;Prosthetic Training;Compression bandaging;Passive range of motion;Energy conservation    PT Next Visit Plan Assess gait outdoors with wide RW (did she get hers?), assess picking up item from floor, continue to work towards [place] (we did [employer] in // bars and she did well, maybe take it to the countertop?) Continue with standing balance until knee is adjusted, sit<>stand, gait with knee flexion-having her ride prosthesis all the way over stance, dynamic balance with decreasing UE support, standing strengthening (if unable to tolerate due to knee pain, give supine strengthening),  if able to tolerate wearing 2hours, 2x/day, then up to 3hrs, 2x/day    PT Home Exercise Plan --    Consulted and Agree with Plan of  Care Patient           Patient will benefit from skilled therapeutic intervention in order to improve the following deficits and impairments:  Abnormal gait, Decreased activity tolerance, Decreased balance, Decreased endurance, Decreased knowledge of precautions, Decreased knowledge of use of DME, Decreased mobility, Decreased range of motion, Decreased strength, Difficulty walking, Increased edema, Impaired perceived functional ability, Impaired flexibility, Impaired sensation, Postural dysfunction, Improper body mechanics, Prosthetic Dependency  Visit Diagnosis: Unsteadiness on feet  Other abnormalities of gait and mobility  Abnormal posture  Repeated falls  Hip stiffness, left     Problem List Patient Active Problem List   Diagnosis Date Noted  . Chronic pain  syndrome 01/16/2020  . Hx of AKA (above knee amputation), left ([place])   . Symptomatic anemia 10/21/2019  . Anemia 10/21/2019  . Acute respiratory failure with hypoxia ([place]) 10/21/2019  . Blurry vision, right eye 10/08/2019  . Tobacco use 03/01/2019  . Phantom limb pain ([place]) 11/08/2018  . Current moderate episode of major depressive disorder ([place])   . Hyperkalemia   . Anemia of chronic disease   . Severe protein-calorie malnutrition ([place])   . Fall   . Status post below knee amputation, left ([place]) 04/11/2017  . Bipolar affective disorder ([place])   . Heart failure with preserved ejection fraction ([place]) 02/09/2017  . Routine adult health maintenance 12/08/2016  . Chronic pain of right knee 09/08/2016  . Chronic kidney disease (CKD), stage III (moderate) ([place]) 07/15/2016  . OSA (obstructive sleep apnea) 05/13/2016  . Morbid obesity due to excess calories ([place]) 11/27/2015  . Hypothyroidism 04/25/2013  . Controlled type 2 diabetes mellitus ([place]) 07/12/2012  . Carpal tunnel syndrome, bilateral 01/25/2012  . Diabetic polyneuropathy associated with type 2 diabetes mellitus ([place]) 07/04/2011  . Anxiety and depression  06/02/2011  . GERD 12/06/2007  . Hyperlipidemia 08/29/2007  . Hypertension associated with diabetes ([place]) 08/29/2007    [name] [name], Betha Loa 04/09/2020, 1:46 PM  [facility] [address] [place], [place], [address] Phone: [phone]   Fax:  [phone]  Name: [name] [name] MRN: [id number] Date of Birth: 21-Jul-1975

## 2020-04-20 ENCOUNTER — Encounter: Payer: Medicare HMO | Admitting: Student

## 2020-04-21 ENCOUNTER — Encounter: Payer: Medicare HMO | Admitting: Student

## 2020-04-22 ENCOUNTER — Encounter: Payer: Self-pay | Admitting: Physical Therapy

## 2020-04-22 ENCOUNTER — Other Ambulatory Visit: Payer: Self-pay

## 2020-04-22 ENCOUNTER — Ambulatory Visit: Payer: Medicare HMO | Attending: Orthopedic Surgery | Admitting: Physical Therapy

## 2020-04-22 DIAGNOSIS — R2681 Unsteadiness on feet: Secondary | ICD-10-CM | POA: Diagnosis not present

## 2020-04-22 DIAGNOSIS — R2689 Other abnormalities of gait and mobility: Secondary | ICD-10-CM | POA: Insufficient documentation

## 2020-04-22 DIAGNOSIS — M25652 Stiffness of left hip, not elsewhere classified: Secondary | ICD-10-CM | POA: Insufficient documentation

## 2020-04-22 DIAGNOSIS — R293 Abnormal posture: Secondary | ICD-10-CM | POA: Diagnosis not present

## 2020-04-22 DIAGNOSIS — R296 Repeated falls: Secondary | ICD-10-CM | POA: Insufficient documentation

## 2020-04-23 NOTE — Progress Notes (Signed)
°   04/22/20 1325  Symptoms/Limitations  Subjective Reports continued rotation of prosthesis after she's been up on it for awhile. Still waiting on wide RW for [employer]. No falls.  Limitations Walking;House hold activities;Standing  Patient Stated Goals "I want to be able to walk on this leg and move around"  Pain Assessment  Currently in Pain? Yes  Pain Score 8  Pain Location Knee  Pain Orientation Right  Pain Descriptors / Indicators Aching  Pain Type Chronic pain  Pain Onset More than a month ago  Pain Frequency Intermittent  Aggravating Factors  standing/walking for longer periods  Pain Relieving Factors heat/medication

## 2020-04-23 NOTE — Therapy (Signed)
[facility] [address] [place], [place], [address] Phone: [phone]   Fax:  [phone]  Physical Therapy Treatment  Patient Details  Name: [name] [name] MRN: [id number] Date of Birth: 07/16/1975 Referring Provider (PT): Meridee Score, MD   Encounter Date: 04/22/2020     04/22/20 1327  PT Visits / Re-Eval  Visit Number 12  Number of Visits 27 (per updated POC)  Date for PT Re-Evaluation 06/08/20 (updated POC)  Authorization  Authorization Type [employer] [place] and Medicaid (10th visit needed)  Authorization Time Period 9/22-11/21. 9 visits from 12/1- 05/21/2020  Authorization - Visit Number 1  Authorization - Number of Visits 9  Progress Note Due on Visit 20  PT Time Calculation  PT Start Time 1318  PT Stop Time 1400  PT Time Calculation (min) 42 min  PT - End of Session  Equipment Utilized During Treatment Gait belt  Activity Tolerance Patient limited by pain;Patient tolerated treatment well;No increased pain  Behavior During Therapy WFL for tasks assessed/performed    Past Medical History:  Diagnosis Date  . Abdominal muscle pain 09/08/2016  . Abnormal Pap smear of cervix 2009  . Abscess    history of multiple abscesses  . Acute bilateral low back pain 02/13/2017  . Acute blood loss anemia   . Acute on chronic renal failure ([facility]) 07/12/2012  . Acute renal failure ([facility]) 07/12/2012  . Adjustment disorder with depressed mood 03/26/2017  . AKI (acute kidney injury) ([facility])   . Anemia of chronic disease 2002  . Anxiety    Panic attacks  . Bilateral lower extremity edema 05/13/2016  . Bipolar disorder ([facility])   . Cataract    B/L cataract  . Cellulitis 05/21/2014   right eye  . Chronic bronchitis ([facility])    "get it q yr" (05/13/2013)  . Chronic pain   . Dehiscence of amputation stump ([facility])   . Depression   . Edema of lower extremity   . Endocarditis 2002   subacute bacterial endocarditis.   . Family  history of anesthesia complication    "my mom has a hard time coming out from under"  . Fibromyalgia   . GERD (gastroesophageal reflux disease)    occ  . Heart murmur   . Herpes simplex type 1 infection 01/16/2018  . History of blood transfusion    "just low blood count" (05/13/2013)  . Hyperlipidemia   . Hypertension   . Hypothyroidism   . Hypothyroidism, adult 03/21/2014  . Hypoxia   . Leukocytosis   . Necrosis ([facility])    and ulceration  . Necrotizing fasciitis s/p OR debridements 07/06/2012  . Obesity   . OSA on CPAP    does not wear all the time  . Peripheral neuropathy   . Pneumonia   . Type II diabetes mellitus (H[facility])    Type  II  . Wound dehiscence 09/04/2019    Past Surgical History:  Procedure Laterality Date  . ABDOMINAL AORTOGRAM W/LOWER EXTREMITY Left 03/19/2019   Procedure: ABDOMINAL AORTOGRAM W/LOWER EXTREMITY;  Surgeon: Serafina Mitchell, MD;  Location: [facility];  Service: Cardiovascular;  Laterality: Left;  . ABOVE KNEE LEG AMPUTATION Left 10/11/2019   wound dehisence   . AIR/FLUID EXCHANGE Right 12/31/2019   Procedure: AIR/FLUID EXCHANGE;  Surgeon: Jalene Mullet, MD;  Location: [facility];  Service: Ophthalmology;  Laterality: Right;  . AMPUTATION Left 04/07/2017   Procedure: LEFT BELOW KNEE AMPUTATION;  Surgeon: Newt Minion, MD;  Location: [facility]  OR;  Service: Orthopedics;  Laterality: Left;  . AMPUTATION Left 10/11/2019   Procedure: LEFT ABOVE KNEE AMPUTATION;  Surgeon: Newt Minion, MD;  Location: [facility];  Service: Orthopedics;  Laterality: Left;  . CATARACT EXTRACTION W/PHACO Right 02/11/2020   Procedure: CATARACT EXTRACTION PHACO AND INTRAOCULAR LENS PLACEMENT (IOC);  Surgeon: Jalene Mullet, MD;  Location: [facility];  Service: Ophthalmology;  Laterality: Right;  . CORONARY ARTERY BYPASS GRAFT    . EYE SURGERY     lazer  . GAS INSERTION Right 12/31/2019   Procedure: INSERTION OF GAS;  Surgeon: Jalene Mullet, MD;  Location: [facility];  Service: Ophthalmology;   Laterality: Right;  . INCISION AND DRAINAGE ABSCESS     multiple I&Ds  . INCISION AND DRAINAGE ABSCESS Left 07/09/2012   Procedure: DRESSING CHANGE, THIGH WOUND;  Surgeon: Harl Bowie, MD;  Location: [facility];  Service: General;  Laterality: Left;  . INCISION AND DRAINAGE OF WOUND Left 07/07/2012   Procedure: IRRIGATION AND DEBRIDEMENT WOUND;  Surgeon: Harl Bowie, MD;  Location: [facility];  Service: General;  Laterality: Left;  . INCISION AND DRAINAGE PERIRECTAL ABSCESS Left 07/14/2012   Procedure: DEBRIDEMENT OF SKIN & SOFT TISSUE; DRESSING CHANGE UNDER ANESTHESIA;  Surgeon: Gayland Curry, MD,FACS;  Location: [facility];  Service: General;  Laterality: Left;  . INCISION AND DRAINAGE PERIRECTAL ABSCESS Left 07/16/2012   Procedure: I&D Left Thigh;  Surgeon: Gwenyth Ober, MD;  Location: [facility];  Service: General;  Laterality: Left;  . INCISION AND DRAINAGE PERIRECTAL ABSCESS N/A 01/05/2015   Procedure: IRRIGATION AND DEBRIDEMENT PERIRECTAL ABSCESS;  Surgeon: Donnie Mesa, MD;  Location: [facility];  Service: General;  Laterality: N/A;  . IRRIGATION AND DEBRIDEMENT ABSCESS Left 07/06/2012   Procedure: IRRIGATION AND DEBRIDEMENT ABSCESS BUTTOCKS AND THIGH;  Surgeon: Shann Medal, MD;  Location: [facility];  Service: General;  Laterality: Left;  . IRRIGATION AND DEBRIDEMENT ABSCESS Left 08/10/2012   Procedure: IRRIGATION AND DEBRIDEMENT ABSCESS;  Surgeon: Madilyn Hook, DO;  Location: [facility];  Service: General;  Laterality: Left;  . PARS PLANA VITRECTOMY Right 12/31/2019   Procedure: PARS PLANA VITRECTOMY WITH 25 GAUGE;  Surgeon: Jalene Mullet, MD;  Location: [facility];  Service: Ophthalmology;  Laterality: Right;  . PHOTOCOAGULATION WITH LASER Right 12/31/2019   Procedure: PHOTOCOAGULATION WITH LASER;  Surgeon: Jalene Mullet, MD;  Location: [facility];  Service: Ophthalmology;  Laterality: Right;  . REPAIR OF COMPLEX TRACTION RETINAL DETACHMENT Right 12/31/2019   Procedure: REPAIR OF COMPLEX TRACTION RETINAL;   Surgeon: Jalene Mullet, MD;  Location: [facility];  Service: Ophthalmology;  Laterality: Right;  . STUMP REVISION Left 06/09/2017   Procedure: REVISION LEFT BELOW KNEE AMPUTATION;  Surgeon: Newt Minion, MD;  Location: [facility];  Service: Orthopedics;  Laterality: Left;  . STUMP REVISION Left 09/04/2019   Procedure: LEFT BELOW KNEE AMPUTATION REVISION;  Surgeon: Newt Minion, MD;  Location: [facility];  Service: Orthopedics;  Laterality: Left;  . STUMP REVISION Left 09/20/2019   Procedure: LEFT BELOW KNEE AMPUTATION REVISION;  Surgeon: Newt Minion, MD;  Location: [facility];  Service: Orthopedics;  Laterality: Left;  . TONSILLECTOMY  1994    There were no vitals filed for this visit.     04/22/20 1325  Symptoms/Limitations  Subjective Reports continued rotation of prosthesis after she's been up on it for awhile. Still waiting on wide RW for [employer]. No falls.  Limitations Walking;House hold activities;Standing  Patient Stated Goals "I want to be able to walk  on this leg and move around"  Pain Assessment  Currently in Pain? Yes  Pain Score 8  Pain Location Knee  Pain Orientation Right  Pain Descriptors / Indicators Aching  Pain Type Chronic pain  Pain Onset More than a month ago  Pain Frequency Intermittent  Aggravating Factors  standing/walking for longer periods  Pain Relieving Factors heat/medication      04/22/20 1335  Transfers  Transfers Sit to Stand;Stand to Sit  Sit to Stand 5: Supervision;With upper extremity assist;From chair/3-in-1  Stand to Sit 5: Supervision;With upper extremity assist;To chair/3-in-1  Ambulation/Gait  Ambulation/Gait Yes  Ambulation/Gait Assistance 4: Min guard  Ambulation/Gait Assistance Details cues on posture, weight shifting and to engage knee with swing phase of gait. toes noted to scuff even with knee flexion. ? leg length descrepancy, will try heel wedge on non prosthetic side next session.   Ambulation Distance (Feet) 100 Feet (x1 indoors; 350 x1  in/outdoors)  Assistive device Rolling walker;Prosthesis (wide RW)  Gait Pattern Step-to pattern;Decreased stride length;Lateral hip instability;Lateral trunk lean to right;Trunk flexed;Wide base of support  Ambulation Surface Level;Indoor  Prosthetics  Prosthetic Care Comments  discussed other pain management techniques to manage right LE pain- ice massage, regular massage, heat, stetching.   Current prosthetic wear tolerance (days/week)  daily   Current prosthetic wear tolerance (#hours/day)  3 hours in am or pm, depending on pain she gets another wear of 2-3 hours as well. definitly one of them is 3 hours.   Residual limb condition  Pt reports no skin issues  Education Provided Proper wear schedule/adjustment;Proper weight-bearing schedule/adjustment  Person(s) Educated Patient  Education Method Explanation;Demonstration;Verbal cues  Education Method Verbalized understanding;Returned demonstration;Verbal cues required;Needs further instruction  Donning Prosthesis 6        PT Short Term Goals - 04/09/20 1346      PT SHORT TERM GOAL #1   Title Pt will be independent with ongoing standing HEP with prosthesis.   (Target Date: 05/09/20)    Baseline met per pt report    Time 4    Period Weeks    Status On-going    Target Date 05/09/20      PT SHORT TERM GOAL #2   Title Pt will be able to tolerate wearing prosthesis 5 hrs, 2x/day with no skin issues and with good understanding of swelling management.    Time 4    Period Weeks    Status Revised      PT SHORT TERM GOAL #3   Title Pt will  perform dynamic tasks with intermittent UE support x 10 mins at S level and reach to floor level to retrieve item.    Time 4    Period Weeks    Status Revised      PT SHORT TERM GOAL #4   Title Pt will ambulate x 300' with RW at mod I level without rest break in order to indicate improved activity tolerance.    Time 4    Period Weeks    Status Revised      PT SHORT TERM GOAL #5   Title Pt  will improve gait speed to >/=1.17 ft/sec w/ RW and prosthesis to indicate dec fall risk.    Time 4    Period Weeks    Status Revised             PT Long Term Goals - 04/09/20 1349      PT LONG TERM GOAL #1   Title Pt will be  independent with final HEP in order to indicate improved functional mobility and balance with use of prosthesis.  (Target Date: 06/08/20)    Baseline inconsistently performing    Time 8    Period Weeks    Status On-going    Target Date 06/08/20      PT LONG TERM GOAL #2   Title Pt will be able to tolerate wearing for 90% of awake hours without skin breakdown and with good understanding of swelling mangement.    Baseline Just started 3hrs, 2x/day on 04/06/20, is donning leg independently    Time 8    Period Weeks    Status Revised      PT LONG TERM GOAL #3   Title Pt will tolerate standing x 15 mins while performing tasks intermittently without UE support and pick up item from floor with single UE support at S level.    Baseline is unable to stand for 15 mins (can stand for up to 10 mins at a time), can reach forward 10", unable to assess picking up item from floor    Time 8    Period Weeks    Status Revised      PT LONG TERM GOAL #4   Title Pt will improve gait speed to >/=1.37 ft/sec with LRAD and prosthesis to indicate dec fall risk.    Baseline .48 ft/sec with RW at S level successfully flexing knee throughout gait    Time 8    Period Weeks    Status Revised      PT LONG TERM GOAL #5   Title Pt will ambulate x 25' w/ SBQC (or LRAD) and prosthesis at min A level in order to indicate improved independence with gait.    Time 8    Period Weeks    Status New              04/22/20 1333  Plan  Clinical Impression Statement Today's skilled session focus on gait with wide RW on indoor/outdoor surfaces with emphasis on prosthetic knee flexion with swing phase. Pt with decreased gait speed, more so with outdoor surfaces. No issues noted or reported  in session. Also discussed alternate ways for decreased pain. Pt to also discuss continued elevated pain with her MD as this impacts her ability to participate in therapy. The pt should benefit from continued PT to progress toward unmet goals.  Personal Factors and Comorbidities Comorbidity 3+;Past/Current Experience  Comorbidities see above  Examination-Activity Limitations Bend;Carry;Dressing;Stand;Stairs;Squat;Locomotion Level;Transfers  Examination-Participation Restrictions Community Activity  Pt will benefit from skilled therapeutic intervention in order to improve on the following deficits Abnormal gait;Decreased activity tolerance;Decreased balance;Decreased endurance;Decreased knowledge of precautions;Decreased knowledge of use of DME;Decreased mobility;Decreased range of motion;Decreased strength;Difficulty walking;Increased edema;Impaired perceived functional ability;Impaired flexibility;Impaired sensation;Postural dysfunction;Improper body mechanics;Prosthetic Dependency  Stability/Clinical Decision Making Evolving/Moderate complexity  Rehab Potential Good  PT Frequency 2x / week  PT Duration 8 weeks  PT Treatment/Interventions ADLs/Self Care Home Management;DME Instruction;Gait training;Stair training;Functional mobility training;Therapeutic activities;Therapeutic exercise;Balance training;Neuromuscular re-education;Patient/family education;Prosthetic Training;Compression bandaging;Passive range of motion;Energy conservation  PT Next Visit Plan assess picking up item from floor, continue to work towards [place] (we did [facility] in // bars and she did well, maybe take it to the countertop?) Continue with standing balance until knee is adjusted, sit<>stand, gait with knee flexion-having her ride prosthesis all the way over stance, dynamic balance with decreasing UE support, standing strengthening (if unable to tolerate due to knee pain, give supine strengthening).  Consulted and Agree with Plan  of  Care Patient         Patient will benefit from skilled therapeutic intervention in order to improve the following deficits and impairments:  Abnormal gait, Decreased activity tolerance, Decreased balance, Decreased endurance, Decreased knowledge of precautions, Decreased knowledge of use of DME, Decreased mobility, Decreased range of motion, Decreased strength, Difficulty walking, Increased edema, Impaired perceived functional ability, Impaired flexibility, Impaired sensation, Postural dysfunction, Improper body mechanics, Prosthetic Dependency  Visit Diagnosis: Unsteadiness on feet  Other abnormalities of gait and mobility  Abnormal posture  Repeated falls     Problem List Patient Active Problem List   Diagnosis Date Noted  . Chronic pain syndrome 01/16/2020  . Hx of AKA (above knee amputation), left ([facility])   . Symptomatic anemia 10/21/2019  . Anemia 10/21/2019  . Acute respiratory failure with hypoxia ([facility]) 10/21/2019  . Blurry vision, right eye 10/08/2019  . Tobacco use 03/01/2019  . Phantom limb pain ([facility]) 11/08/2018  . Current moderate episode of major depressive disorder ([facility])   . Hyperkalemia   . Anemia of chronic disease   . Severe protein-calorie malnutrition ([facility])   . Fall   . Status post below knee amputation, left ([facility]) 04/11/2017  . Bipolar affective disorder ([facility])   . Heart failure with preserved ejection fraction ([facility]) 02/09/2017  . Routine adult health maintenance 12/08/2016  . Chronic pain of right knee 09/08/2016  . Chronic kidney disease (CKD), stage III (moderate) ([facility]) 07/15/2016  . OSA (obstructive sleep apnea) 05/13/2016  . Morbid obesity due to excess calories ([facility]) 11/27/2015  . Hypothyroidism 04/25/2013  . Controlled type 2 diabetes mellitus ([facility]) 07/12/2012  . Carpal tunnel syndrome, bilateral 01/25/2012  . Diabetic polyneuropathy associated with type 2 diabetes mellitus ([facility]) 07/04/2011  . Anxiety and depression 06/02/2011  . GERD  12/06/2007  . Hyperlipidemia 08/29/2007  . Hypertension associated with diabetes ([facility]) 08/29/2007   Willow Ora, PTA, [facility] [address] [place], [place] [address] [phone] 04/23/20, 10:56 PM   Name: NAKOTA ACKERT MRN: [id number] Date of Birth: [birth year]

## 2020-04-24 ENCOUNTER — Ambulatory Visit: Payer: Medicare HMO | Admitting: Physical Therapy

## 2020-04-24 ENCOUNTER — Encounter: Payer: Self-pay | Admitting: Physical Therapy

## 2020-04-24 ENCOUNTER — Other Ambulatory Visit: Payer: Self-pay

## 2020-04-24 DIAGNOSIS — R293 Abnormal posture: Secondary | ICD-10-CM | POA: Diagnosis not present

## 2020-04-24 DIAGNOSIS — R2681 Unsteadiness on feet: Secondary | ICD-10-CM | POA: Diagnosis not present

## 2020-04-24 DIAGNOSIS — M25652 Stiffness of left hip, not elsewhere classified: Secondary | ICD-10-CM

## 2020-04-24 DIAGNOSIS — R2689 Other abnormalities of gait and mobility: Secondary | ICD-10-CM

## 2020-04-24 DIAGNOSIS — R296 Repeated falls: Secondary | ICD-10-CM

## 2020-04-24 NOTE — Therapy (Signed)
[facility] [address] [place], [place], [address] Phone: [phone]   Fax:  [phone]  Physical Therapy Treatment  Patient Details  Name: LILIT CINELLI MRN: [id number] Date of Birth: 1975/07/28 Referring Provider (PT): Meridee Score, MD   Encounter Date: 04/24/2020   PT End of Session - 04/24/20 1238    Visit Number 13    Number of Visits 27   per updated POC   Date for PT Re-Evaluation 06/08/20   updated POC   Authorization Type [employer] [place] and Medicaid (10th visit needed)    Authorization Time Period 9/22-11/21. 9 visits from 12/1- 05/21/2020    Authorization - Visit Number 2    Authorization - Number of Visits 9    Progress Note Due on Visit 20    PT Start Time 1232    PT Stop Time 1315    PT Time Calculation (min) 43 min    Equipment Utilized During Treatment Gait belt    Activity Tolerance Patient limited by pain;Patient tolerated treatment well;No increased pain    Behavior During Therapy WFL for tasks assessed/performed           Past Medical History:  Diagnosis Date  . Abdominal muscle pain 09/08/2016  . Abnormal Pap smear of cervix 2009  . Abscess    history of multiple abscesses  . Acute bilateral low back pain 02/13/2017  . Acute blood loss anemia   . Acute on chronic renal failure ([place]) 07/12/2012  . Acute renal failure ([place]) 07/12/2012  . Adjustment disorder with depressed mood 03/26/2017  . AKI (acute kidney injury) ([place])   . Anemia of chronic disease 2002  . Anxiety    Panic attacks  . Bilateral lower extremity edema 05/13/2016  . Bipolar disorder ([place])   . Cataract    B/L cataract  . Cellulitis 05/21/2014   right eye  . Chronic bronchitis ([place])    "get it q yr" (05/13/2013)  . Chronic pain   . Dehiscence of amputation stump ([place])   . Depression   . Edema of lower extremity   . Endocarditis 2002   subacute bacterial endocarditis.   . Family history of anesthesia complication     "my mom has a hard time coming out from under"  . Fibromyalgia   . GERD (gastroesophageal reflux disease)    occ  . Heart murmur   . Herpes simplex type 1 infection 01/16/2018  . History of blood transfusion    "just low blood count" (05/13/2013)  . Hyperlipidemia   . Hypertension   . Hypothyroidism   . Hypothyroidism, adult 03/21/2014  . Hypoxia   . Leukocytosis   . Necrosis ([facility])    and ulceration  . Necrotizing fasciitis s/p OR debridements 07/06/2012  . Obesity   . OSA on CPAP    does not wear all the time  . Peripheral neuropathy   . Pneumonia   . Type II diabetes mellitus ([facility])    Type  II  . Wound dehiscence 09/04/2019    Past Surgical History:  Procedure Laterality Date  . ABDOMINAL AORTOGRAM W/LOWER EXTREMITY Left 03/19/2019   Procedure: ABDOMINAL AORTOGRAM W/LOWER EXTREMITY;  Surgeon: Serafina Mitchell, MD;  Location: [facility];  Service: Cardiovascular;  Laterality: Left;  . ABOVE KNEE LEG AMPUTATION Left 10/11/2019   wound dehisence   . AIR/FLUID EXCHANGE Right 12/31/2019   Procedure: AIR/FLUID EXCHANGE;  Surgeon: Jalene Mullet, MD;  Location: [facility];  Service: Ophthalmology;  Laterality: Right;  . AMPUTATION Left 04/07/2017   Procedure: LEFT BELOW KNEE AMPUTATION;  Surgeon: Newt Minion, MD;  Location: [facility];  Service: Orthopedics;  Laterality: Left;  . AMPUTATION Left 10/11/2019   Procedure: LEFT ABOVE KNEE AMPUTATION;  Surgeon: Newt Minion, MD;  Location: [facility];  Service: Orthopedics;  Laterality: Left;  . CATARACT EXTRACTION W/PHACO Right 02/11/2020   Procedure: CATARACT EXTRACTION PHACO AND INTRAOCULAR LENS PLACEMENT (IOC);  Surgeon: Jalene Mullet, MD;  Location: [facility];  Service: Ophthalmology;  Laterality: Right;  . CORONARY ARTERY BYPASS GRAFT    . EYE SURGERY     lazer  . GAS INSERTION Right 12/31/2019   Procedure: INSERTION OF GAS;  Surgeon: Jalene Mullet, MD;  Location: [facility];  Service: Ophthalmology;  Laterality: Right;  . INCISION  AND DRAINAGE ABSCESS     multiple I&Ds  . INCISION AND DRAINAGE ABSCESS Left 07/09/2012   Procedure: DRESSING CHANGE, THIGH WOUND;  Surgeon: Harl Bowie, MD;  Location: [facility];  Service: General;  Laterality: Left;  . INCISION AND DRAINAGE OF WOUND Left 07/07/2012   Procedure: IRRIGATION AND DEBRIDEMENT WOUND;  Surgeon: Harl Bowie, MD;  Location: [facility];  Service: General;  Laterality: Left;  . INCISION AND DRAINAGE PERIRECTAL ABSCESS Left 07/14/2012   Procedure: DEBRIDEMENT OF SKIN & SOFT TISSUE; DRESSING CHANGE UNDER ANESTHESIA;  Surgeon: Gayland Curry, MD,FACS;  Location: [facility];  Service: General;  Laterality: Left;  . INCISION AND DRAINAGE PERIRECTAL ABSCESS Left 07/16/2012   Procedure: I&D Left Thigh;  Surgeon: Gwenyth Ober, MD;  Location: [facility];  Service: General;  Laterality: Left;  . INCISION AND DRAINAGE PERIRECTAL ABSCESS N/A 01/05/2015   Procedure: IRRIGATION AND DEBRIDEMENT PERIRECTAL ABSCESS;  Surgeon: Donnie Mesa, MD;  Location: [facility];  Service: General;  Laterality: N/A;  . IRRIGATION AND DEBRIDEMENT ABSCESS Left 07/06/2012   Procedure: IRRIGATION AND DEBRIDEMENT ABSCESS BUTTOCKS AND THIGH;  Surgeon: Shann Medal, MD;  Location: [facility];  Service: General;  Laterality: Left;  . IRRIGATION AND DEBRIDEMENT ABSCESS Left 08/10/2012   Procedure: IRRIGATION AND DEBRIDEMENT ABSCESS;  Surgeon: Madilyn Hook, DO;  Location: [facility];  Service: General;  Laterality: Left;  . PARS PLANA VITRECTOMY Right 12/31/2019   Procedure: PARS PLANA VITRECTOMY WITH 25 GAUGE;  Surgeon: Jalene Mullet, MD;  Location: [facility];  Service: Ophthalmology;  Laterality: Right;  . PHOTOCOAGULATION WITH LASER Right 12/31/2019   Procedure: PHOTOCOAGULATION WITH LASER;  Surgeon: Jalene Mullet, MD;  Location: [facility];  Service: Ophthalmology;  Laterality: Right;  . REPAIR OF COMPLEX TRACTION RETINAL DETACHMENT Right 12/31/2019   Procedure: REPAIR OF COMPLEX TRACTION RETINAL;  Surgeon: Jalene Mullet, MD;  Location:  [facility];  Service: Ophthalmology;  Laterality: Right;  . STUMP REVISION Left 06/09/2017   Procedure: REVISION LEFT BELOW KNEE AMPUTATION;  Surgeon: Newt Minion, MD;  Location: [facility];  Service: Orthopedics;  Laterality: Left;  . STUMP REVISION Left 09/04/2019   Procedure: LEFT BELOW KNEE AMPUTATION REVISION;  Surgeon: Newt Minion, MD;  Location: [facility];  Service: Orthopedics;  Laterality: Left;  . STUMP REVISION Left 09/20/2019   Procedure: LEFT BELOW KNEE AMPUTATION REVISION;  Surgeon: Newt Minion, MD;  Location: [facility];  Service: Orthopedics;  Laterality: Left;  . TONSILLECTOMY  1994    There were no vitals filed for this visit.   Subjective Assessment - 04/24/20 1237    Subjective No new complaints. No falls. Pain is the "usual".    Limitations Walking;House  hold activities;Standing    Patient Stated Goals "I want to be able to walk on this leg and move around"    Currently in Pain? Yes    Pain Score 7     Pain Location Knee    Pain Orientation Right    Pain Descriptors / Indicators Aching    Pain Type Chronic pain    Pain Onset More than a month ago    Pain Frequency Intermittent    Aggravating Factors  standing/walking for longer periods of time    Pain Relieving Factors heat/medication                [facility] Adult PT Treatment/Exercise - 04/24/20 1239      Transfers   Transfers Sit to Stand;Stand to Sit    Sit to Stand 5: Supervision;With upper extremity assist;From chair/3-in-1    Stand to Sit 5: Supervision;With upper extremity assist;To chair/3-in-1      Ambulation/Gait   Ambulation/Gait Yes    Ambulation/Gait Assistance 4: Min guard;5: Supervision    Ambulation/Gait Assistance Details cues for posture, weight shifting and to engage knee with swing phase of gait.     Ambulation Distance (Feet) 115 Feet   x2   Assistive device Rolling walker;Prosthesis    Gait Pattern Step-to pattern;Decreased stride length;Lateral hip instability;Lateral trunk lean to  right;Trunk flexed;Wide base of support    Ambulation Surface Level;Indoor    Gait Comments pt noted to have a circumduction/whip action with swing phase of prosthesis. noted that pt's thigh component of socket is flush against her right thight with and lateral off set knee component. this causes pt to have to circumduct/whip to advance prosthesis. Also not that after intial toe touch to bend knee, toes continue to drag. Need to have prosthetist address socket fit due air bubbles and possibly height adjustment. Not sure if the knee can be brought more medially or not.                    Prosthetics   Prosthetic Care Comments  discussed above observations with gait and that PTA could hear air pockets/escaping top of socket with gait. Pt to set appt with prosthetist for fit check and height check.     Current prosthetic wear tolerance (days/week)  daily     Current prosthetic wear tolerance (#hours/day)  3 hours in am or pm, depending on pain she gets another wear of 2-3 hours as well. definitly one of them is 3 hours.     Residual limb condition  Pt reports no skin issues    Education Provided Residual limb care;Proper wear schedule/adjustment   call prosthetist   Person(s) Educated Patient    Education Method Explanation;Demonstration;Verbal cues    Education Method Verbalized understanding;Returned demonstration;Verbal cues required;Needs further instruction    Donning Prosthesis Modified independent (device/increased time)                    PT Short Term Goals - 04/09/20 1346      PT SHORT TERM GOAL #1   Title Pt will be independent with ongoing standing HEP with prosthesis.   (Target Date: 05/09/20)    Baseline met per pt report    Time 4    Period Weeks    Status On-going    Target Date 05/09/20      PT SHORT TERM GOAL #2   Title Pt will be able to tolerate wearing prosthesis 5 hrs, 2x/day with no skin issues and  with good understanding of swelling management.    Time 4      Period Weeks    Status Revised      PT SHORT TERM GOAL #3   Title Pt will  perform dynamic tasks with intermittent UE support x 10 mins at S level and reach to floor level to retrieve item.    Time 4    Period Weeks    Status Revised      PT SHORT TERM GOAL #4   Title Pt will ambulate x 300' with RW at mod I level without rest break in order to indicate improved activity tolerance.    Time 4    Period Weeks    Status Revised      PT SHORT TERM GOAL #5   Title Pt will improve gait speed to >/=1.17 ft/sec w/ RW and prosthesis to indicate dec fall risk.    Time 4    Period Weeks    Status Revised             PT Long Term Goals - 04/09/20 1349      PT LONG TERM GOAL #1   Title Pt will be independent with final HEP in order to indicate improved functional mobility and balance with use of prosthesis.  (Target Date: 06/08/20)    Baseline inconsistently performing    Time 8    Period Weeks    Status On-going    Target Date 06/08/20      PT LONG TERM GOAL #2   Title Pt will be able to tolerate wearing for 90% of awake hours without skin breakdown and with good understanding of swelling mangement.    Baseline Just started 3hrs, 2x/day on 04/06/20, is donning leg independently    Time 8    Period Weeks    Status Revised      PT LONG TERM GOAL #3   Title Pt will tolerate standing x 15 mins while performing tasks intermittently without UE support and pick up item from floor with single UE support at S level.    Baseline is unable to stand for 15 mins (can stand for up to 10 mins at a time), can reach forward 10", unable to assess picking up item from floor    Time 8    Period Weeks    Status Revised      PT LONG TERM GOAL #4   Title Pt will improve gait speed to >/=1.37 ft/sec with LRAD and prosthesis to indicate dec fall risk.    Baseline .31 ft/sec with RW at S level successfully flexing knee throughout gait    Time 8    Period Weeks    Status Revised      PT LONG TERM  GOAL #5   Title Pt will ambulate x 25' w/ SBQC (or LRAD) and prosthesis at min A level in order to indicate improved independence with gait.    Time 8    Period Weeks    Status New                 Plan - 04/24/20 1238    Clinical Impression Statement Today's skilled session continued to focus on prosthetic fit and gait mechanincs. See note section on gait flowsheet for full details. The pt is to call Gerald Stabs to set up an appointment. The pt should benefit from continued PT to progress toward unmet goals.    Personal Factors and Comorbidities Comorbidity 3+;Past/Current Experience  Comorbidities see above    Examination-Activity Limitations Bend;Carry;Dressing;Stand;Stairs;Squat;Locomotion Level;Transfers    Examination-Participation Restrictions Community Activity    Stability/Clinical Decision Making Evolving/Moderate complexity    Rehab Potential Good    PT Frequency 2x / week    PT Duration 8 weeks    PT Treatment/Interventions ADLs/Self Care Home Management;DME Instruction;Gait training;Stair training;Functional mobility training;Therapeutic activities;Therapeutic exercise;Balance training;Neuromuscular re-education;Patient/family education;Prosthetic Training;Compression bandaging;Passive range of motion;Energy conservation    PT Next Visit Plan assess picking up item from floor, continue to work towards [place] (we did [facility] in // bars and she did well, maybe take it to the countertop?) Continue with standing balance until knee is adjusted, sit<>stand, gait with knee flexion-having her ride prosthesis all the way over stance, dynamic balance with decreasing UE support, standing strengthening (if unable to tolerate due to knee pain, give supine strengthening).    Consulted and Agree with Plan of Care Patient           Patient will benefit from skilled therapeutic intervention in order to improve the following deficits and impairments:  Abnormal gait, Decreased activity tolerance,  Decreased balance, Decreased endurance, Decreased knowledge of precautions, Decreased knowledge of use of DME, Decreased mobility, Decreased range of motion, Decreased strength, Difficulty walking, Increased edema, Impaired perceived functional ability, Impaired flexibility, Impaired sensation, Postural dysfunction, Improper body mechanics, Prosthetic Dependency  Visit Diagnosis: Unsteadiness on feet  Other abnormalities of gait and mobility  Abnormal posture  Repeated falls  Hip stiffness, left     Problem List Patient Active Problem List   Diagnosis Date Noted  . Chronic pain syndrome 01/16/2020  . Hx of AKA (above knee amputation), left ([place])   . Symptomatic anemia 10/21/2019  . Anemia 10/21/2019  . Acute respiratory failure with hypoxia ([place]) 10/21/2019  . Blurry vision, right eye 10/08/2019  . Tobacco use 03/01/2019  . Phantom limb pain ([facility]) 11/08/2018  . Current moderate episode of major depressive disorder ([place])   . Hyperkalemia   . Anemia of chronic disease   . Severe protein-calorie malnutrition ([place])   . Fall   . Status post below knee amputation, left ([place] [facility]) 04/11/2017  . Bipolar affective disorder ([place])   . Heart failure with preserved ejection fraction ([place]) 02/09/2017  . Routine adult health maintenance 12/08/2016  . Chronic pain of right knee 09/08/2016  . Chronic kidney disease (CKD), stage III (moderate) ([place]) 07/15/2016  . OSA (obstructive sleep apnea) 05/13/2016  . Morbid obesity due to excess calories ([place]) 11/27/2015  . Hypothyroidism 04/25/2013  . Controlled type 2 diabetes mellitus ([place]) 07/12/2012  . Carpal tunnel syndrome, bilateral 01/25/2012  . Diabetic polyneuropathy associated with type 2 diabetes mellitus ([place]) 07/04/2011  . Anxiety and depression 06/02/2011  . GERD 12/06/2007  . Hyperlipidemia 08/29/2007  . Hypertension associated with diabetes ([place]) 08/29/2007    Willow Ora, PTA, [facility] [address]  [place], [place] [address] [phone] 04/24/20, 3:33 PM   Name: TARRY BLAYNEY MRN: [id number] Date of Birth: Oct 04, 1975

## 2020-04-27 ENCOUNTER — Other Ambulatory Visit: Payer: Self-pay | Admitting: Internal Medicine

## 2020-04-27 ENCOUNTER — Other Ambulatory Visit: Payer: Self-pay | Admitting: Student

## 2020-04-27 DIAGNOSIS — G894 Chronic pain syndrome: Secondary | ICD-10-CM

## 2020-04-27 DIAGNOSIS — Z89512 Acquired absence of left leg below knee: Secondary | ICD-10-CM | POA: Diagnosis not present

## 2020-04-29 ENCOUNTER — Ambulatory Visit: Payer: Medicare HMO | Admitting: Physical Therapy

## 2020-05-01 ENCOUNTER — Encounter: Payer: Self-pay | Admitting: Physical Therapy

## 2020-05-01 ENCOUNTER — Ambulatory Visit: Payer: Medicare HMO | Admitting: Physical Therapy

## 2020-05-01 ENCOUNTER — Other Ambulatory Visit: Payer: Self-pay

## 2020-05-01 DIAGNOSIS — R293 Abnormal posture: Secondary | ICD-10-CM | POA: Diagnosis not present

## 2020-05-01 DIAGNOSIS — R296 Repeated falls: Secondary | ICD-10-CM

## 2020-05-01 DIAGNOSIS — R2681 Unsteadiness on feet: Secondary | ICD-10-CM

## 2020-05-01 DIAGNOSIS — M25652 Stiffness of left hip, not elsewhere classified: Secondary | ICD-10-CM | POA: Diagnosis not present

## 2020-05-01 DIAGNOSIS — R2689 Other abnormalities of gait and mobility: Secondary | ICD-10-CM

## 2020-05-01 NOTE — Therapy (Signed)
[facility] [address] [place], [place], [address] Phone: [phone]   Fax:  [phone]  Physical Therapy Treatment  Patient Details  Name: [name] [name] MRN: [id number] Date of Birth: 01-29-1976 Referring Provider (PT): Meridee Score, MD   Encounter Date: 05/01/2020   PT End of Session - 05/01/20 1027    Visit Number 14    Number of Visits 27   per updated POC   Date for PT Re-Evaluation 06/08/20   updated POC   Authorization Type [employer] and Medicaid (10th visit needed)    Authorization Time Period 9/22-11/21. 9 visits from 12/1- 05/21/2020    Authorization - Visit Number 3    Authorization - Number of Visits 9    Progress Note Due on Visit 20    PT Start Time 1018    PT Stop Time 1100    PT Time Calculation (min) 42 min    Equipment Utilized During Treatment Gait belt    Activity Tolerance Patient tolerated treatment well;No increased pain;Patient limited by fatigue    Behavior During Therapy [facility] for tasks assessed/performed           Past Medical History:  Diagnosis Date  . Abdominal muscle pain 09/08/2016  . Abnormal Pap smear of cervix 2009  . Abscess    history of multiple abscesses  . Acute bilateral low back pain 02/13/2017  . Acute blood loss anemia   . Acute on chronic renal failure ([place]) 07/12/2012  . Acute renal failure ([place]) 07/12/2012  . Adjustment disorder with depressed mood 03/26/2017  . AKI (acute kidney injury) ([place])   . Anemia of chronic disease 2002  . Anxiety    Panic attacks  . Bilateral lower extremity edema 05/13/2016  . Bipolar disorder ([place])   . Cataract    B/L cataract  . Cellulitis 05/21/2014   right eye  . Chronic bronchitis ([place])    "get it q yr" (05/13/2013)  . Chronic pain   . Dehiscence of amputation stump ([place])   . Depression   . Edema of lower extremity   . Endocarditis 2002   subacute bacterial endocarditis.   . Family history of anesthesia  complication    "my mom has a hard time coming out from under"  . Fibromyalgia   . GERD (gastroesophageal reflux disease)    occ  . Heart murmur   . Herpes simplex type 1 infection 01/16/2018  . History of blood transfusion    "just low blood count" (05/13/2013)  . Hyperlipidemia   . Hypertension   . Hypothyroidism   . Hypothyroidism, adult 03/21/2014  . Hypoxia   . Leukocytosis   . Necrosis ([place])    and ulceration  . Necrotizing fasciitis s/p OR debridements 07/06/2012  . Obesity   . OSA on CPAP    does not wear all the time  . Peripheral neuropathy   . Pneumonia   . Type II diabetes mellitus (HCC)    Type  II  . Wound dehiscence 09/04/2019    Past Surgical History:  Procedure Laterality Date  . ABDOMINAL AORTOGRAM W/LOWER EXTREMITY Left 03/19/2019   Procedure: ABDOMINAL AORTOGRAM W/LOWER EXTREMITY;  Surgeon: Serafina Mitchell, MD;  Location: [facility];  Service: Cardiovascular;  Laterality: Left;  . ABOVE KNEE LEG AMPUTATION Left 10/11/2019   wound dehisence   . AIR/FLUID EXCHANGE Right 12/31/2019   Procedure: AIR/FLUID EXCHANGE;  Surgeon: Jalene Mullet, MD;  Location: [facility];  Service: Ophthalmology;  Laterality: Right;  . AMPUTATION Left 04/07/2017   Procedure: LEFT BELOW KNEE AMPUTATION;  Surgeon: Newt Minion, MD;  Location: [facility];  Service: Orthopedics;  Laterality: Left;  . AMPUTATION Left 10/11/2019   Procedure: LEFT ABOVE KNEE AMPUTATION;  Surgeon: Newt Minion, MD;  Location: [facility];  Service: Orthopedics;  Laterality: Left;  . CATARACT EXTRACTION W/PHACO Right 02/11/2020   Procedure: CATARACT EXTRACTION PHACO AND INTRAOCULAR LENS PLACEMENT (IOC);  Surgeon: Jalene Mullet, MD;  Location: [facility];  Service: Ophthalmology;  Laterality: Right;  . CORONARY ARTERY BYPASS GRAFT    . EYE SURGERY     lazer  . GAS INSERTION Right 12/31/2019   Procedure: INSERTION OF GAS;  Surgeon: Jalene Mullet, MD;  Location: [facility];  Service: Ophthalmology;  Laterality: Right;   . INCISION AND DRAINAGE ABSCESS     multiple I&Ds  . INCISION AND DRAINAGE ABSCESS Left 07/09/2012   Procedure: DRESSING CHANGE, THIGH WOUND;  Surgeon: Harl Bowie, MD;  Location: [facility];  Service: General;  Laterality: Left;  . INCISION AND DRAINAGE OF WOUND Left 07/07/2012   Procedure: IRRIGATION AND DEBRIDEMENT WOUND;  Surgeon: Harl Bowie, MD;  Location: [facility];  Service: General;  Laterality: Left;  . INCISION AND DRAINAGE PERIRECTAL ABSCESS Left 07/14/2012   Procedure: DEBRIDEMENT OF SKIN & SOFT TISSUE; DRESSING CHANGE UNDER ANESTHESIA;  Surgeon: Gayland Curry, MD,FACS;  Location: [facility];  Service: General;  Laterality: Left;  . INCISION AND DRAINAGE PERIRECTAL ABSCESS Left 07/16/2012   Procedure: I&D Left Thigh;  Surgeon: Gwenyth Ober, MD;  Location: [facility];  Service: General;  Laterality: Left;  . INCISION AND DRAINAGE PERIRECTAL ABSCESS N/A 01/05/2015   Procedure: IRRIGATION AND DEBRIDEMENT PERIRECTAL ABSCESS;  Surgeon: Donnie Mesa, MD;  Location: [facility];  Service: General;  Laterality: N/A;  . IRRIGATION AND DEBRIDEMENT ABSCESS Left 07/06/2012   Procedure: IRRIGATION AND DEBRIDEMENT ABSCESS BUTTOCKS AND THIGH;  Surgeon: Shann Medal, MD;  Location: [facility];  Service: General;  Laterality: Left;  . IRRIGATION AND DEBRIDEMENT ABSCESS Left 08/10/2012   Procedure: IRRIGATION AND DEBRIDEMENT ABSCESS;  Surgeon: Madilyn Hook, DO;  Location: [facility];  Service: General;  Laterality: Left;  . PARS PLANA VITRECTOMY Right 12/31/2019   Procedure: PARS PLANA VITRECTOMY WITH 25 GAUGE;  Surgeon: Jalene Mullet, MD;  Location: [facility];  Service: Ophthalmology;  Laterality: Right;  . PHOTOCOAGULATION WITH LASER Right 12/31/2019   Procedure: PHOTOCOAGULATION WITH LASER;  Surgeon: Jalene Mullet, MD;  Location: [facility];  Service: Ophthalmology;  Laterality: Right;  . REPAIR OF COMPLEX TRACTION RETINAL DETACHMENT Right 12/31/2019   Procedure: REPAIR OF COMPLEX TRACTION RETINAL;  Surgeon: Jalene Mullet,  MD;  Location: [facility];  Service: Ophthalmology;  Laterality: Right;  . STUMP REVISION Left 06/09/2017   Procedure: REVISION LEFT BELOW KNEE AMPUTATION;  Surgeon: Newt Minion, MD;  Location: [facility];  Service: Orthopedics;  Laterality: Left;  . STUMP REVISION Left 09/04/2019   Procedure: LEFT BELOW KNEE AMPUTATION REVISION;  Surgeon: Newt Minion, MD;  Location: [facility];  Service: Orthopedics;  Laterality: Left;  . STUMP REVISION Left 09/20/2019   Procedure: LEFT BELOW KNEE AMPUTATION REVISION;  Surgeon: Newt Minion, MD;  Location: [facility];  Service: Orthopedics;  Laterality: Left;  . TONSILLECTOMY  1994    There were no vitals filed for this visit.   Subjective Assessment - 05/01/20 1024    Subjective No new complaints. No falls. Pain is the "usual". Fell asleep in her wheelchair  the other day and her limb swollen so she could not get her prosthesis on. Had to take more of her fluid pills to get her limb down enought to get the prosthesis on. Fitting better now.    Limitations Walking;House hold activities;Standing    Patient Stated Goals "I want to be able to walk on this leg and move around"    Currently in Pain? Yes    Pain Score 7     Pain Location Knee    Pain Orientation Right    Pain Descriptors / Indicators Aching    Pain Type Chronic pain    Pain Onset More than a month ago    Pain Frequency Intermittent    Aggravating Factors  standing/walking for longer periods of time    Pain Relieving Factors heat/medication                [facility] Adult PT Treatment/Exercise - 05/01/20 1028      Transfers   Transfers Sit to Stand;Stand to Sit    Sit to Stand 5: Supervision;With upper extremity assist;From chair/3-in-1    Stand to Sit 5: Supervision;With upper extremity assist;To chair/3-in-1      Ambulation/Gait   Ambulation/Gait Yes    Ambulation/Gait Assistance 5: Supervision    Ambulation/Gait Assistance Details cues on posture, walker position with gait, increased cues needed  as pt fatigued.    Ambulation Distance (Feet) 130 Feet   x2, 235 x1, plus around gym   Assistive device Rolling walker;Prosthesis    Gait Pattern Step-through pattern;Decreased stride length;Decreased stance time - left;Decreased step length - right;Decreased trunk rotation;Trunk flexed;Wide base of support    Ambulation Surface Level;Indoor      Therapeutic Activites    Therapeutic Activities Other Therapeutic Activities    Other Therapeutic Activities standing with RW in front, no UE support: alternating UE's to toss bean bags forward 10 feet to basket target with min guard assist for balance. Performed x 2 sets with basket moved higher/farther for second set.      Prosthetics   Prosthetic Care Comments  informed pt that her prosthetist has a second smaller liner for her to pick up. Pt reporting dry skin on her limb. Discussed use of lotion at night and clean it off in am prior to donning liner. Also discussed use of anti perspirant under liner for moisture and heat rash control.    Current prosthetic wear tolerance (days/week)  daily     Current prosthetic wear tolerance (#hours/day)  3 hours in am or pm, depending on pain she gets another wear of 2-3 hours as well. definitly one of them is 3 hours.     Residual limb condition  Pt reports no skin issues    Education Provided Residual limb care;Proper wear schedule/adjustment;Proper weight-bearing schedule/adjustment    Person(s) Educated Patient    Education Method Explanation;Verbal cues;Demonstration    Education Method Verbalized understanding;Returned demonstration;Verbal cues required;Needs further instruction    Donning Prosthesis Modified independent (device/increased time)                    PT Short Term Goals - 04/09/20 1346      PT SHORT TERM GOAL #1   Title Pt will be independent with ongoing standing HEP with prosthesis.   (Target Date: 05/09/20)    Baseline met per pt report    Time 4    Period Weeks    Status  On-going    Target Date 05/09/20  PT SHORT TERM GOAL #2   Title Pt will be able to tolerate wearing prosthesis 5 hrs, 2x/day with no skin issues and with good understanding of swelling management.    Time 4    Period Weeks    Status Revised      PT SHORT TERM GOAL #3   Title Pt will  perform dynamic tasks with intermittent UE support x 10 mins at S level and reach to floor level to retrieve item.    Time 4    Period Weeks    Status Revised      PT SHORT TERM GOAL #4   Title Pt will ambulate x 300' with RW at mod I level without rest break in order to indicate improved activity tolerance.    Time 4    Period Weeks    Status Revised      PT SHORT TERM GOAL #5   Title Pt will improve gait speed to >/=1.17 ft/sec w/ RW and prosthesis to indicate dec fall risk.    Time 4    Period Weeks    Status Revised             PT Long Term Goals - 04/09/20 1349      PT LONG TERM GOAL #1   Title Pt will be independent with final HEP in order to indicate improved functional mobility and balance with use of prosthesis.  (Target Date: 06/08/20)    Baseline inconsistently performing    Time 8    Period Weeks    Status On-going    Target Date 06/08/20      PT LONG TERM GOAL #2   Title Pt will be able to tolerate wearing for 90% of awake hours without skin breakdown and with good understanding of swelling mangement.    Baseline Just started 3hrs, 2x/day on 04/06/20, is donning leg independently    Time 8    Period Weeks    Status Revised      PT LONG TERM GOAL #3   Title Pt will tolerate standing x 15 mins while performing tasks intermittently without UE support and pick up item from floor with single UE support at S level.    Baseline is unable to stand for 15 mins (can stand for up to 10 mins at a time), can reach forward 10", unable to assess picking up item from floor    Time 8    Period Weeks    Status Revised      PT LONG TERM GOAL #4   Title Pt will improve gait speed to  >/=1.37 ft/sec with LRAD and prosthesis to indicate dec fall risk.    Baseline .17 ft/sec with RW at S level successfully flexing knee throughout gait    Time 8    Period Weeks    Status Revised      PT LONG TERM GOAL #5   Title Pt will ambulate x 25' w/ SBQC (or LRAD) and prosthesis at min A level in order to indicate improved independence with gait.    Time 8    Period Weeks    Status New                 Plan - 05/01/20 1027    Clinical Impression Statement Today's skilled session continued to focus on prosthetic education, activity tolerance with increased gait distance and unsupported static standing balance. No issues noted. Rest break needed after each gait rep due to fatigue/mild shortiness of  breath with quick recovery. The pt is progressing and should benefit from continued PT to progress toward unmet goals.    Personal Factors and Comorbidities Comorbidity 3+;Past/Current Experience    Comorbidities see above    Examination-Activity Limitations Bend;Carry;Dressing;Stand;Stairs;Squat;Locomotion Level;Transfers    Examination-Participation Restrictions Community Activity    Stability/Clinical Decision Making Evolving/Moderate complexity    Rehab Potential Good    PT Frequency 2x / week    PT Duration 8 weeks    PT Treatment/Interventions ADLs/Self Care Home Management;DME Instruction;Gait training;Stair training;Functional mobility training;Therapeutic activities;Therapeutic exercise;Balance training;Neuromuscular re-education;Patient/family education;Prosthetic Training;Compression bandaging;Passive range of motion;Energy conservation    PT Next Visit Plan assess picking up item from floor, Continue with standing balance until knee is adjusted, sit<>stand, gait with knee flexion-having her ride prosthesis all the way over stance, dynamic balance with decreasing UE support, standing strengthening (if unable to tolerate due to knee pain, give supine strengthening). gait with  small base quad cane as able, has been limited due to knee pain.    Consulted and Agree with Plan of Care Patient           Patient will benefit from skilled therapeutic intervention in order to improve the following deficits and impairments:  Abnormal gait,Decreased activity tolerance,Decreased balance,Decreased endurance,Decreased knowledge of precautions,Decreased knowledge of use of DME,Decreased mobility,Decreased range of motion,Decreased strength,Difficulty walking,Increased edema,Impaired perceived functional ability,Impaired flexibility,Impaired sensation,Postural dysfunction,Improper body mechanics,Prosthetic Dependency  Visit Diagnosis: Unsteadiness on feet  Other abnormalities of gait and mobility  Abnormal posture  Repeated falls     Problem List Patient Active Problem List   Diagnosis Date Noted  . Chronic pain syndrome 01/16/2020  . Hx of AKA (above knee amputation), left ([place])   . Symptomatic anemia 10/21/2019  . Anemia 10/21/2019  . Acute respiratory failure with hypoxia ([place]) 10/21/2019  . Blurry vision, right eye 10/08/2019  . Tobacco use 03/01/2019  . Phantom limb pain ([place]) 11/08/2018  . Current moderate episode of major depressive disorder ([place])   . Hyperkalemia   . Anemia of chronic disease   . Severe protein-calorie malnutrition ([place])   . Fall   . Status post below knee amputation, left ([place]) 04/11/2017  . Bipolar affective disorder ([place])   . Heart failure with preserved ejection fraction ([place]) 02/09/2017  . Routine adult health maintenance 12/08/2016  . Chronic pain of right knee 09/08/2016  . Chronic kidney disease (CKD), stage III (moderate) ([place]) 07/15/2016  . OSA (obstructive sleep apnea) 05/13/2016  . Morbid obesity due to excess calories ([place]) 11/27/2015  . Hypothyroidism 04/25/2013  . Controlled type 2 diabetes mellitus ([place]) 07/12/2012  . Carpal tunnel syndrome, bilateral 01/25/2012  . Diabetic polyneuropathy associated with type 2  diabetes mellitus ([place]) 07/04/2011  . Anxiety and depression 06/02/2011  . GERD 12/06/2007  . Hyperlipidemia 08/29/2007  . Hypertension associated with diabetes ([place]) 08/29/2007    Willow Ora, PTA, [facility] [address] [place],  [address] [phone] 05/01/20, 4:58 PM   Name: [name] [name] MRN: [id number] Date of Birth: 1975-08-22

## 2020-05-04 ENCOUNTER — Encounter: Payer: Medicare HMO | Admitting: Student

## 2020-05-04 ENCOUNTER — Telehealth: Payer: Self-pay

## 2020-05-04 NOTE — Telephone Encounter (Signed)
Patient was a no-show for an appointment today with Dr. Collene Gobble.  RN unable to reach patient by phone. SChaplin, RN,BSN

## 2020-05-05 ENCOUNTER — Other Ambulatory Visit: Payer: Self-pay

## 2020-05-05 MED ORDER — MUPIROCIN 2 % EX OINT
1.0000 | TOPICAL_OINTMENT | Freq: Every day | CUTANEOUS | 0 refills | Status: DC | PRN
Start: 2020-05-05 — End: 2020-12-10

## 2020-05-06 ENCOUNTER — Ambulatory Visit: Payer: Medicare HMO

## 2020-05-08 ENCOUNTER — Ambulatory Visit: Payer: Medicare HMO | Admitting: Physical Therapy

## 2020-05-13 ENCOUNTER — Ambulatory Visit: Payer: Medicare HMO | Admitting: Physical Therapy

## 2020-05-19 ENCOUNTER — Ambulatory Visit: Payer: Medicare HMO | Admitting: Physical Therapy

## 2020-05-21 ENCOUNTER — Ambulatory Visit: Payer: Medicare HMO | Admitting: Rehabilitation

## 2020-05-26 ENCOUNTER — Other Ambulatory Visit: Payer: Self-pay

## 2020-05-26 ENCOUNTER — Encounter: Payer: Self-pay | Admitting: Internal Medicine

## 2020-05-26 ENCOUNTER — Telehealth: Payer: Self-pay

## 2020-05-26 ENCOUNTER — Ambulatory Visit (INDEPENDENT_AMBULATORY_CARE_PROVIDER_SITE_OTHER): Payer: Medicare HMO | Admitting: Internal Medicine

## 2020-05-26 DIAGNOSIS — R059 Cough, unspecified: Secondary | ICD-10-CM | POA: Insufficient documentation

## 2020-05-26 NOTE — Telephone Encounter (Addendum)
Return pt's call - stated she trying to go back to physical therapy but since she a cold about 3 weeks ,they need her doctor to say it's ok. Stated she does not have COVID. 3 weeks ago, she had a old cough (smoker), coughing up thick phlegm, and slight stomach ache. But no covid symptoms; and currently denies any covid symptoms. Feels better now. Telehealth appt offered; she agreed - schedule with Dr Court Joy @ 1545 PM.

## 2020-05-26 NOTE — Telephone Encounter (Signed)
Requesting to speak with a nurse about having COVID test. Please call pt back @ [phone] or [phone].

## 2020-05-26 NOTE — Progress Notes (Signed)
  [facility] Internal Medicine Residency Telephone Encounter Continuity Care Appointment  HPI:   This telephone encounter was created for [name] on 05/26/2020 for the following purpose/cc clearing her to return to physical therapy.   Past Medical History:  Past Medical History:  Diagnosis Date  . Abdominal muscle pain 09/08/2016  . Abnormal Pap smear of cervix 2009  . Abscess    history of multiple abscesses  . Acute bilateral low back pain 02/13/2017  . Acute blood loss anemia   . Acute on chronic renal failure ([place]) 07/12/2012  . Acute renal failure ([place]) 07/12/2012  . Adjustment disorder with depressed mood 03/26/2017  . AKI (acute kidney injury) ([place])   . Anemia of chronic disease 2002  . Anxiety    Panic attacks  . Bilateral lower extremity edema 05/13/2016  . Bipolar disorder ([place])   . Cataract    B/L cataract  . Cellulitis 05/21/2014   right eye  . Chronic bronchitis ([place])    "get it q yr" (05/13/2013)  . Chronic pain   . Dehiscence of amputation stump ([place])   . Depression   . Edema of lower extremity   . Endocarditis 2002   subacute bacterial endocarditis.   . Family history of anesthesia complication    "my mom has a hard time coming out from under"  . Fibromyalgia   . GERD (gastroesophageal reflux disease)    occ  . Heart murmur   . Herpes simplex type 1 infection 01/16/2018  . History of blood transfusion    "just low blood count" (05/13/2013)  . Hyperlipidemia   . Hypertension   . Hypothyroidism   . Hypothyroidism, adult 03/21/2014  . Hypoxia   . Leukocytosis   . Necrosis ([place])    and ulceration  . Necrotizing fasciitis s/p OR debridements 07/06/2012  . Obesity   . OSA on CPAP    does not wear all the time  . Peripheral neuropathy   . Pneumonia   . Type II diabetes mellitus (HCC)    Type  II  . Wound dehiscence 09/04/2019      ROS:   Negative for Fever, chills, lost of taste, lost of smell   Assessment / Plan /  Recommendations:   Please see A&P under problem oriented charting for assessment of the patient's acute and chronic medical conditions.   As always, pt is advised that if symptoms worsen or new symptoms arise, they should go to an urgent care facility or to to ER for further evaluation.   Consent and Medical Decision Making:   Patient discussed with Dr. Philipp Ovens  This is a telephone encounter between [name] [name] and [name] [name] on 05/26/2020 for clearance to return to physical therapy. The visit was conducted with the patient located at home and [name] [name] at [facility]. The patient's identity was confirmed using their DOB and current address. The patient has consented to being evaluated through a telephone encounter and understands the associated risks (an examination cannot be done and the patient may need to come in for an appointment) / benefits (allows the patient to remain at home, decreasing exposure to coronavirus). I personally spent 21 minutes on medical discussion.

## 2020-05-26 NOTE — Assessment & Plan Note (Addendum)
Reports having a cough for 7-8 days and it is now gone. Last day of symptoms 05/19/2020. She missed physical therapy during this time.  She was told she may not pass the screening questions and would need to contact her primary care doctor to be cleared to resume physical therapy.  She is not vaccinated for COVID-19.  She had no loss of taste, loss of smell, fevers, or chills during this time.  I cannot say for certain the etiology given patient is now over her illness.  Could possibly have been a asthma exacerbation or CHF exacerbation.  Given patient is feeling much better and is 5 days out of any symptoms she can return to physical therapy.  We will recommend she wear a mask strictly for 5 days around other and in the future while not vaccinated.

## 2020-05-28 ENCOUNTER — Other Ambulatory Visit: Payer: Self-pay | Admitting: Student

## 2020-05-28 ENCOUNTER — Other Ambulatory Visit: Payer: Self-pay | Admitting: Internal Medicine

## 2020-05-28 DIAGNOSIS — F419 Anxiety disorder, unspecified: Secondary | ICD-10-CM

## 2020-05-28 DIAGNOSIS — Z89512 Acquired absence of left leg below knee: Secondary | ICD-10-CM | POA: Diagnosis not present

## 2020-05-28 DIAGNOSIS — F32A Depression, unspecified: Secondary | ICD-10-CM

## 2020-05-28 DIAGNOSIS — G894 Chronic pain syndrome: Secondary | ICD-10-CM

## 2020-06-01 MED ORDER — CYCLOBENZAPRINE HCL 5 MG PO TABS
ORAL_TABLET | ORAL | 0 refills | Status: DC
Start: 2020-06-01 — End: 2020-08-13

## 2020-06-01 MED ORDER — HYDROXYZINE HCL 25 MG PO TABS
ORAL_TABLET | ORAL | 0 refills | Status: DC
Start: 2020-06-01 — End: 2020-10-02

## 2020-06-02 NOTE — Progress Notes (Signed)
Internal Medicine Attending Note:  This patient's plan of care was discussed with the house staff. Please see their note for complete details. I concur with their findings.  Velna Ochs, MD 06/02/2020, 12:24 PM

## 2020-06-03 ENCOUNTER — Other Ambulatory Visit: Payer: Self-pay

## 2020-06-03 ENCOUNTER — Ambulatory Visit: Payer: Medicare HMO | Attending: Orthopedic Surgery | Admitting: Rehabilitation

## 2020-06-03 ENCOUNTER — Encounter: Payer: Self-pay | Admitting: Rehabilitation

## 2020-06-03 DIAGNOSIS — R2689 Other abnormalities of gait and mobility: Secondary | ICD-10-CM | POA: Insufficient documentation

## 2020-06-03 DIAGNOSIS — R2681 Unsteadiness on feet: Secondary | ICD-10-CM | POA: Diagnosis not present

## 2020-06-03 DIAGNOSIS — R296 Repeated falls: Secondary | ICD-10-CM | POA: Diagnosis not present

## 2020-06-03 DIAGNOSIS — R293 Abnormal posture: Secondary | ICD-10-CM | POA: Insufficient documentation

## 2020-06-03 DIAGNOSIS — M25652 Stiffness of left hip, not elsewhere classified: Secondary | ICD-10-CM | POA: Diagnosis not present

## 2020-06-03 NOTE — Therapy (Signed)
[facility] [address] [place], [place], [address] Phone: [phone]   Fax:  [phone]  Physical Therapy Treatment  Patient Details  Name: [name] [name] MRN: [id number] Date of Birth: December 10, 1975 Referring Provider (PT): Meridee Score, MD   Encounter Date: 06/03/2020   PT End of Session - 06/03/20 1042    Visit Number 15    Number of Visits 27   per updated POC   Date for PT Re-Evaluation 07/03/20   updated POC   Authorization Type [employer] [place] and Medicaid (10th visit needed)    Authorization Time Period John to update visit approval    Authorization - Visit Number 1    Progress Note Due on Visit 20    PT Start Time [phone]    PT Stop Time 1020    PT Time Calculation (min) 38 min    Equipment Utilized During Treatment Gait belt    Activity Tolerance Patient tolerated treatment well;No increased pain;Patient limited by fatigue    Behavior During Therapy [facility] for tasks assessed/performed           Past Medical History:  Diagnosis Date  . Abdominal muscle pain 09/08/2016  . Abnormal Pap smear of cervix 2009  . Abscess    history of multiple abscesses  . Acute bilateral low back pain 02/13/2017  . Acute blood loss anemia   . Acute on chronic renal failure ([place]) 07/12/2012  . Acute renal failure ([place]) 07/12/2012  . Adjustment disorder with depressed mood 03/26/2017  . AKI (acute kidney injury) ([place])   . Anemia of chronic disease 2002  . Anxiety    Panic attacks  . Bilateral lower extremity edema 05/13/2016  . Bipolar disorder ([place])   . Cataract    B/L cataract  . Cellulitis 05/21/2014   right eye  . Chronic bronchitis ([place])    "get it q yr" (05/13/2013)  . Chronic pain   . Dehiscence of amputation stump ([place])   . Depression   . Edema of lower extremity   . Endocarditis 2002   subacute bacterial endocarditis.   . Family history of anesthesia complication    "my mom has a hard time coming out from  under"  . Fibromyalgia   . GERD (gastroesophageal reflux disease)    occ  . Heart murmur   . Herpes simplex type 1 infection 01/16/2018  . History of blood transfusion    "just low blood count" (05/13/2013)  . Hyperlipidemia   . Hypertension   . Hypothyroidism   . Hypothyroidism, adult 03/21/2014  . Hypoxia   . Leukocytosis   . Necrosis ([place])    and ulceration  . Necrotizing fasciitis s/p OR debridements 07/06/2012  . Obesity   . OSA on CPAP    does not wear all the time  . Peripheral neuropathy   . Pneumonia   . Type II diabetes mellitus (HCC)    Type  II  . Wound dehiscence 09/04/2019    Past Surgical History:  Procedure Laterality Date  . ABDOMINAL AORTOGRAM W/LOWER EXTREMITY Left 03/19/2019   Procedure: ABDOMINAL AORTOGRAM W/LOWER EXTREMITY;  Surgeon: Serafina Mitchell, MD;  Location: [facility];  Service: Cardiovascular;  Laterality: Left;  . ABOVE KNEE LEG AMPUTATION Left 10/11/2019   wound dehisence   . AIR/FLUID EXCHANGE Right 12/31/2019   Procedure: AIR/FLUID EXCHANGE;  Surgeon: Jalene Mullet, MD;  Location: [facility];  Service: Ophthalmology;  Laterality: Right;  . AMPUTATION Left 04/07/2017   Procedure:  LEFT BELOW KNEE AMPUTATION;  Surgeon: Newt Minion, MD;  Location: [facility];  Service: Orthopedics;  Laterality: Left;  . AMPUTATION Left 10/11/2019   Procedure: LEFT ABOVE KNEE AMPUTATION;  Surgeon: Newt Minion, MD;  Location: [facility];  Service: Orthopedics;  Laterality: Left;  . CATARACT EXTRACTION W/PHACO Right 02/11/2020   Procedure: CATARACT EXTRACTION PHACO AND INTRAOCULAR LENS PLACEMENT (IOC);  Surgeon: Jalene Mullet, MD;  Location: [facility];  Service: Ophthalmology;  Laterality: Right;  . CORONARY ARTERY BYPASS GRAFT    . EYE SURGERY     lazer  . GAS INSERTION Right 12/31/2019   Procedure: INSERTION OF GAS;  Surgeon: Jalene Mullet, MD;  Location: [facility];  Service: Ophthalmology;  Laterality: Right;  . INCISION AND DRAINAGE ABSCESS     multiple I&Ds  .  INCISION AND DRAINAGE ABSCESS Left 07/09/2012   Procedure: DRESSING CHANGE, THIGH WOUND;  Surgeon: Harl Bowie, MD;  Location: [facility];  Service: General;  Laterality: Left;  . INCISION AND DRAINAGE OF WOUND Left 07/07/2012   Procedure: IRRIGATION AND DEBRIDEMENT WOUND;  Surgeon: Harl Bowie, MD;  Location: [facility];  Service: General;  Laterality: Left;  . INCISION AND DRAINAGE PERIRECTAL ABSCESS Left 07/14/2012   Procedure: DEBRIDEMENT OF SKIN & SOFT TISSUE; DRESSING CHANGE UNDER ANESTHESIA;  Surgeon: Gayland Curry, MD,FACS;  Location: [facility];  Service: General;  Laterality: Left;  . INCISION AND DRAINAGE PERIRECTAL ABSCESS Left 07/16/2012   Procedure: I&D Left Thigh;  Surgeon: Gwenyth Ober, MD;  Location: [facility];  Service: General;  Laterality: Left;  . INCISION AND DRAINAGE PERIRECTAL ABSCESS N/A 01/05/2015   Procedure: IRRIGATION AND DEBRIDEMENT PERIRECTAL ABSCESS;  Surgeon: Donnie Mesa, MD;  Location: [facility];  Service: General;  Laterality: N/A;  . IRRIGATION AND DEBRIDEMENT ABSCESS Left 07/06/2012   Procedure: IRRIGATION AND DEBRIDEMENT ABSCESS BUTTOCKS AND THIGH;  Surgeon: Shann Medal, MD;  Location: [facility];  Service: General;  Laterality: Left;  . IRRIGATION AND DEBRIDEMENT ABSCESS Left 08/10/2012   Procedure: IRRIGATION AND DEBRIDEMENT ABSCESS;  Surgeon: Madilyn Hook, DO;  Location: [facility];  Service: General;  Laterality: Left;  . PARS PLANA VITRECTOMY Right 12/31/2019   Procedure: PARS PLANA VITRECTOMY WITH 25 GAUGE;  Surgeon: Jalene Mullet, MD;  Location: [facility];  Service: Ophthalmology;  Laterality: Right;  . PHOTOCOAGULATION WITH LASER Right 12/31/2019   Procedure: PHOTOCOAGULATION WITH LASER;  Surgeon: Jalene Mullet, MD;  Location: [facility];  Service: Ophthalmology;  Laterality: Right;  . REPAIR OF COMPLEX TRACTION RETINAL DETACHMENT Right 12/31/2019   Procedure: REPAIR OF COMPLEX TRACTION RETINAL;  Surgeon: Jalene Mullet, MD;  Location: [facility];  Service: Ophthalmology;   Laterality: Right;  . STUMP REVISION Left 06/09/2017   Procedure: REVISION LEFT BELOW KNEE AMPUTATION;  Surgeon: Newt Minion, MD;  Location: [facility];  Service: Orthopedics;  Laterality: Left;  . STUMP REVISION Left 09/04/2019   Procedure: LEFT BELOW KNEE AMPUTATION REVISION;  Surgeon: Newt Minion, MD;  Location: [facility];  Service: Orthopedics;  Laterality: Left;  . STUMP REVISION Left 09/20/2019   Procedure: LEFT BELOW KNEE AMPUTATION REVISION;  Surgeon: Newt Minion, MD;  Location: [facility];  Service: Orthopedics;  Laterality: Left;  . TONSILLECTOMY  1994    There were no vitals filed for this visit.   Subjective Assessment - 06/03/20 0945    Subjective Pt reports doing well since being sick.  Not having swelling.  But having a lot of pain in R knee.  Limitations Walking;House hold activities;Standing    Patient Stated Goals "I want to be able to walk on this leg and move around"    Currently in Pain? Yes    Pain Score 10-Worst pain ever    Pain Location Knee    Pain Orientation Right    Pain Descriptors / Indicators Aching    Pain Type Chronic pain    Pain Onset More than a month ago    Pain Frequency Constant    Aggravating Factors  standing for long periods of time    Pain Relieving Factors heat/medication                             [facility] Adult PT Treatment/Exercise - 06/03/20 1033      Transfers   Transfers Sit to Stand;Stand to Sit    Sit to Stand 5: Supervision    Sit to Stand Details Visual cues/gestures for precautions/safety;Visual cues/gestures for sequencing;Verbal cues for sequencing    Stand to Sit 5: Supervision;With upper extremity assist;To chair/3-in-1    Stand to Sit Details (indicate cue type and reason) Verbal cues for sequencing;Verbal cues for technique;Verbal cues for precautions/safety      Ambulation/Gait   Ambulation/Gait Yes    Ambulation/Gait Assistance 5: Supervision    Ambulation/Gait Assistance Details Continues to need cues  for upright posture, remaining on prosthesis throughout stance phase to allow knee flex going into swing phase.  She has not received her wide RW yet, so used clinics wide RW at this time.  At end of session did trial [facility] again at counter top with min A and cues for sequencing and technique.  Still has some difficulty with getting knee to flex, did better but got progressively worse with fatigue.  Do not feel that she is ready to begin progressing from RW at this time.    Ambulation Distance (Feet) 80 Feet   x 2 reps   Assistive device Rolling walker;Prosthesis    Gait Pattern Step-through pattern;Decreased stride length;Decreased stance time - left;Decreased step length - right;Decreased trunk rotation;Trunk flexed;Wide base of support    Ambulation Surface Level;Indoor    Gait velocity .73 ft/sec with RW and prosthesis      Dynamic Standing Balance   Dynamic Standing - Level of Assistance 5: Stand by assistance    Dynamic Standing - Balance Activities Lateral lean/weight shifting;Forward lean/weight shifting;[place];Reaching for objects    Lateral lean/weight shifting comments: Performed bean bag toss, reaching to the L and then foward without UE support on RW (was in front of her for safety) at S level.  Pt intermittently needing to take small rest to adjust feet due to R knee pain and low back pain.  Able to stand for 5.30 mins before needing to sit.  She was also demonstrating SOB following task.  Provided education to work on unsupported tasks at home either at [employer] while doing ADLs or tossing ball/stuffed animal to/from daughter.  Pt verbalized understanding.      Prosthetics   Prosthetic Care Comments  Discussed POC with pt and feel that due to her possibly getting a socket revision at end of month, next month, possibly getting injections in knee.  Feel that we will not be able to progress a ton due to issues with R knee pain.  But do feel that we can make progress with her  balance, activity tolerance and gait quality with RW.    Current  prosthetic wear tolerance (days/week)  daily     Current prosthetic wear tolerance (#hours/day)  3 hrs, 2x/day.  Provided education to go ahead and move up to 4hrs, 2x/day    Current prosthetic weight-bearing tolerance (hours/day)  5-10 WB, but limited by R knee pain     Edema none     Residual limb condition  Pt reports no skin issues    Education Provided Care of non-amputated limb;Proper wear schedule/adjustment;Proper weight-bearing schedule/adjustment    Person(s) Educated Patient    Education Method Explanation;Verbal cues    Education Method Verbalized understanding;Needs further instruction    Donning Prosthesis Modified independent (device/increased time)                    PT Short Term Goals - 04/09/20 1346      PT SHORT TERM GOAL #1   Title Pt will be independent with ongoing standing HEP with prosthesis.   (Target Date: 05/09/20)    Baseline met per pt report    Time 4    Period Weeks    Status On-going    Target Date 05/09/20      PT SHORT TERM GOAL #2   Title Pt will be able to tolerate wearing prosthesis 5 hrs, 2x/day with no skin issues and with good understanding of swelling management.    Time 4    Period Weeks    Status Revised      PT SHORT TERM GOAL #3   Title Pt will  perform dynamic tasks with intermittent UE support x 10 mins at S level and reach to floor level to retrieve item.    Time 4    Period Weeks    Status Revised      PT SHORT TERM GOAL #4   Title Pt will ambulate x 300' with RW at mod I level without rest break in order to indicate improved activity tolerance.    Time 4    Period Weeks    Status Revised      PT SHORT TERM GOAL #5   Title Pt will improve gait speed to >/=1.17 ft/sec w/ RW and prosthesis to indicate dec fall risk.    Time 4    Period Weeks    Status Revised             PT Long Term Goals - 06/03/20 0948      PT LONG TERM GOAL #1   Title  Pt will be independent with final HEP in order to indicate improved functional mobility and balance with use of prosthesis.  (Target Date: 06/08/20)    Baseline inconsistently performing    Time 8    Period Weeks    Status Partially Met      PT LONG TERM GOAL #2   Title Pt will be able to tolerate wearing for 90% of awake hours without skin breakdown and with good understanding of swelling mangement.    Baseline Just started 3hrs, 2x/day on 06/03/20, is donning leg independently    Time 8    Period Weeks    Status Not Met      PT LONG TERM GOAL #3   Title Pt will tolerate standing x 15 mins while performing tasks intermittently without UE support and pick up item from floor with single UE support at S level.    Baseline able to stand for 5.30 mins unsupported, can reach forward 10" and can reach item from floor.    Time 8  Period Weeks    Status Partially Met      PT LONG TERM GOAL #4   Title Pt will improve gait speed to >/=1.37 ft/sec with LRAD and prosthesis to indicate dec fall risk.    Baseline .2 ft/sec with RW at S level successfully flexing knee throughout gait, .70 ft/sec on 06/03/20    Time 8    Period Weeks    Status Not Met      PT LONG TERM GOAL #5   Title Pt will ambulate x 25' w/ SBQC (or LRAD) and prosthesis at min A level in order to indicate improved independence with gait.    Baseline 10' at min A level, not ready to progress to this yet due to pain and dec activity tolerance.    Time 8    Period Weeks    Status Partially Met              PT Long Term Goals - 06/03/20 1050      PT LONG TERM GOAL #1   Title Pt will be independent with final HEP in order to indicate improved functional mobility and balance with use of prosthesis.  (Target Date: 07/03/20)    Time 4    Period Weeks    Status On-going      PT LONG TERM GOAL #2   Title Pt will be able to tolerate wearing for 90% of awake hours without skin breakdown and with good understanding of swelling  mangement.    Baseline 3hrs, 2x/day on 06/03/20, is donning leg independently    Time 8    Period Weeks    Status On-going      PT LONG TERM GOAL #3   Title Pt will tolerate standing x 15 mins while performing tasks intermittently without UE support and pick up item from floor with single UE support at mod I level.    Baseline able to stand for 5.30 mins with intermittent UE support, can reach forward 10" and can reach item from floor.    Time 4    Period Weeks    Status Revised      PT LONG TERM GOAL #4   Title Pt will improve gait speed to >/=1.00 ft/sec with LRAD and prosthesis to indicate dec fall risk.    Baseline .7 ft/sec with RW at S level successfully flexing knee throughout gait, .70 ft/sec on 06/03/20    Time 4    Period Weeks    Status Revised      PT LONG TERM GOAL #5   Title Pt will ambulate x 25' w/ SBQC (or LRAD) and prosthesis at min A level in order to indicate improved independence with gait.    Baseline 10' at min A level    Time 4    Period Weeks    Status On-going                Plan - 06/03/20 1045    Clinical Impression Statement Skilled session focused on assessment of LTGs and discussion of new POC.  Pt has partially met 3/5 LTGs, not meeting the other two due to dec gait speed and not increasing wear time.  Feel that pt is greatly limited due to R knee pain.  Note that I do feel she can progress with balance and endurance tolerance, but she may get socket revision in the next month plus trying to figure out what MD will do about R knee, therefore will only renew  for 2x/wk for 4 more weeks.    Personal Factors and Comorbidities Comorbidity 3+;Past/Current Experience    Comorbidities see above    Examination-Activity Limitations Bend;Carry;Dressing;Stand;Stairs;Squat;Locomotion Level;Transfers    Examination-Participation Restrictions Community Activity    Stability/Clinical Decision Making Evolving/Moderate complexity    Rehab Potential Good    PT  Frequency 2x / week    PT Duration 4 weeks    PT Treatment/Interventions ADLs/Self Care Home Management;DME Instruction;Gait training;Stair training;Functional mobility training;Therapeutic activities;Therapeutic exercise;Balance training;Neuromuscular re-education;Patient/family education;Prosthetic Training;Compression bandaging;Passive range of motion;Energy conservation    PT Next Visit Plan Continue with usupported standing balance, sit<>stand, gait with knee flexion-having her ride prosthesis all the way over stance, dynamic balance with decreasing UE support, standing strengthening (if unable to tolerate due to knee pain, give supine strengthening). gait with small base quad cane as able, has been limited due to knee pain.    Consulted and Agree with Plan of Care Patient           Patient will benefit from skilled therapeutic intervention in order to improve the following deficits and impairments:  Abnormal gait,Decreased activity tolerance,Decreased balance,Decreased endurance,Decreased knowledge of precautions,Decreased knowledge of use of DME,Decreased mobility,Decreased range of motion,Decreased strength,Difficulty walking,Increased edema,Impaired perceived functional ability,Impaired flexibility,Impaired sensation,Postural dysfunction,Improper body mechanics,Prosthetic Dependency  Visit Diagnosis: Unsteadiness on feet  Other abnormalities of gait and mobility  Abnormal posture  Repeated falls  Hip stiffness, left     Problem List Patient Active Problem List   Diagnosis Date Noted  . Cough 05/26/2020  . Chronic pain syndrome 01/16/2020  . Hx of AKA (above knee amputation), left ([place])   . Symptomatic anemia 10/21/2019  . Anemia 10/21/2019  . Acute respiratory failure with hypoxia ([place]) 10/21/2019  . Blurry vision, right eye 10/08/2019  . Tobacco use 03/01/2019  . Phantom limb pain ([place]) 11/08/2018  . Current moderate episode of major depressive disorder ([place])   .  Hyperkalemia   . Anemia of chronic disease   . Severe protein-calorie malnutrition ([place])   . Fall   . Status post below knee amputation, left ([place]) 04/11/2017  . Bipolar affective disorder ([place])   . Heart failure with preserved ejection fraction ([place]) 02/09/2017  . Routine adult health maintenance 12/08/2016  . Chronic pain of right knee 09/08/2016  . Chronic kidney disease (CKD), stage III (moderate) ([place]) 07/15/2016  . OSA (obstructive sleep apnea) 05/13/2016  . Morbid obesity due to excess calories ([place]) 11/27/2015  . Hypothyroidism 04/25/2013  . Controlled type 2 diabetes mellitus ([place]) 07/12/2012  . Carpal tunnel syndrome, bilateral 01/25/2012  . Diabetic polyneuropathy associated with type 2 diabetes mellitus ([place]) 07/04/2011  . Anxiety and depression 06/02/2011  . GERD 12/06/2007  . Hyperlipidemia 08/29/2007  . Hypertension associated with diabetes ([place]) 08/29/2007    Cameron Sprang, PT, MPT [facility] [address] [place], [place], [address] Phone: [phone]   Fax:  [phone] 06/03/20, 10:49 AM  Name: [name] [name] MRN: [id number] Date of Birth: 11-Mar-1976

## 2020-06-04 ENCOUNTER — Other Ambulatory Visit: Payer: Self-pay | Admitting: Student

## 2020-06-04 ENCOUNTER — Other Ambulatory Visit: Payer: Self-pay

## 2020-06-04 DIAGNOSIS — G894 Chronic pain syndrome: Secondary | ICD-10-CM

## 2020-06-04 MED ORDER — OXYCODONE-ACETAMINOPHEN 5-325 MG PO TABS: ORAL_TABLET | ORAL | 0 refills | Status: DC

## 2020-06-04 NOTE — Telephone Encounter (Signed)
Requesting all meds to be filled @  [place], [place] - [address] Phone:  [phone]  Fax:  [phone]

## 2020-06-04 NOTE — Telephone Encounter (Signed)
Received TC from [facility], Pharmacist asking about pain med RX.  Informed RX for Oxycodone was sent over on 06/01/20 via [facility] and pharmacist states they did not get it.  After pulling detailed report on RX, RN noted the transmission failed to pharmacy on 06/01/20. Will forward to Dr. Konrad Penta to resend. Thank you! Stacee

## 2020-06-04 NOTE — Telephone Encounter (Signed)
TC to [facility] pharmacy, asked if patient was on automatic refill and if any medications were due for refill.  Pharmacy tech states patient is requesting refill on Oxycodone.  Oxycodone filled on 06/01/20, too early for refill.  TC to patient to inform her of above.  If patient is requesting add'l refills, she needs to specify which medication she would like refills on.  There is no answer, no VM. SChaplin, RN,BSN

## 2020-06-10 ENCOUNTER — Other Ambulatory Visit: Payer: Self-pay

## 2020-06-10 ENCOUNTER — Ambulatory Visit: Payer: Medicare HMO | Admitting: Physical Therapy

## 2020-06-10 ENCOUNTER — Encounter: Payer: Self-pay | Admitting: Physical Therapy

## 2020-06-10 DIAGNOSIS — R296 Repeated falls: Secondary | ICD-10-CM | POA: Diagnosis not present

## 2020-06-10 DIAGNOSIS — R2689 Other abnormalities of gait and mobility: Secondary | ICD-10-CM

## 2020-06-10 DIAGNOSIS — R2681 Unsteadiness on feet: Secondary | ICD-10-CM

## 2020-06-10 DIAGNOSIS — R293 Abnormal posture: Secondary | ICD-10-CM

## 2020-06-10 DIAGNOSIS — M25652 Stiffness of left hip, not elsewhere classified: Secondary | ICD-10-CM | POA: Diagnosis not present

## 2020-06-10 NOTE — Therapy (Signed)
[facility] [address] [place], [place], [address] Phone: [phone]   Fax:  [phone]  Physical Therapy Treatment  Patient Details  Name: [name] [name] MRN: [id number] Date of Birth: 22-Apr-1976 Referring Provider (PT): Meridee Score, MD   Encounter Date: 06/10/2020   PT End of Session - 06/10/20 1022    Visit Number 16    Number of Visits 27   per updated POC   Date for PT Re-Evaluation 07/03/20   updated POC   Authorization Type [employer] [place] and Medicaid (10th visit needed)    Authorization Time Period John to update visit approval    Authorization - Visit Number 2    Progress Note Due on Visit 20    PT Start Time 1017    PT Stop Time 1100    PT Time Calculation (min) 43 min    Equipment Utilized During Treatment Gait belt    Activity Tolerance Patient tolerated treatment well;No increased pain;Patient limited by fatigue    Behavior During Therapy [facility] for tasks assessed/performed           Past Medical History:  Diagnosis Date  . Abdominal muscle pain 09/08/2016  . Abnormal Pap smear of cervix 2009  . Abscess    history of multiple abscesses  . Acute bilateral low back pain 02/13/2017  . Acute blood loss anemia   . Acute on chronic renal failure ([place]) 07/12/2012  . Acute renal failure ([place]) 07/12/2012  . Adjustment disorder with depressed mood 03/26/2017  . AKI (acute kidney injury) ([place])   . Anemia of chronic disease 2002  . Anxiety    Panic attacks  . Bilateral lower extremity edema 05/13/2016  . Bipolar disorder ([place])   . Cataract    B/L cataract  . Cellulitis 05/21/2014   right eye  . Chronic bronchitis ([place])    "get it q yr" (05/13/2013)  . Chronic pain   . Dehiscence of amputation stump ([place])   . Depression   . Edema of lower extremity   . Endocarditis 2002   subacute bacterial endocarditis.   . Family history of anesthesia complication    "my mom has a hard time coming out from  under"  . Fibromyalgia   . GERD (gastroesophageal reflux disease)    occ  . Heart murmur   . Herpes simplex type 1 infection 01/16/2018  . History of blood transfusion    "just low blood count" (05/13/2013)  . Hyperlipidemia   . Hypertension   . Hypothyroidism   . Hypothyroidism, adult 03/21/2014  . Hypoxia   . Leukocytosis   . Necrosis ([place])    and ulceration  . Necrotizing fasciitis s/p OR debridements 07/06/2012  . Obesity   . OSA on CPAP    does not wear all the time  . Peripheral neuropathy   . Pneumonia   . Type II diabetes mellitus (HCC)    Type  II  . Wound dehiscence 09/04/2019    Past Surgical History:  Procedure Laterality Date  . ABDOMINAL AORTOGRAM W/LOWER EXTREMITY Left 03/19/2019   Procedure: ABDOMINAL AORTOGRAM W/LOWER EXTREMITY;  Surgeon: Serafina Mitchell, MD;  Location: [facility];  Service: Cardiovascular;  Laterality: Left;  . ABOVE KNEE LEG AMPUTATION Left 10/11/2019   wound dehisence   . AIR/FLUID EXCHANGE Right 12/31/2019   Procedure: AIR/FLUID EXCHANGE;  Surgeon: Jalene Mullet, MD;  Location: [facility];  Service: Ophthalmology;  Laterality: Right;  . AMPUTATION Left 04/07/2017   Procedure:  LEFT BELOW KNEE AMPUTATION;  Surgeon: Newt Minion, MD;  Location: [facility];  Service: Orthopedics;  Laterality: Left;  . AMPUTATION Left 10/11/2019   Procedure: LEFT ABOVE KNEE AMPUTATION;  Surgeon: Newt Minion, MD;  Location: [facility];  Service: Orthopedics;  Laterality: Left;  . CATARACT EXTRACTION W/PHACO Right 02/11/2020   Procedure: CATARACT EXTRACTION PHACO AND INTRAOCULAR LENS PLACEMENT (IOC);  Surgeon: Jalene Mullet, MD;  Location: [facility];  Service: Ophthalmology;  Laterality: Right;  . CORONARY ARTERY BYPASS GRAFT    . EYE SURGERY     [place]azer  . GAS INSERTION Right 12/31/2019   Procedure: INSERTION OF GAS;  Surgeon: Jalene Mullet, MD;  Location: [facility];  Service: Ophthalmology;  Laterality: Right;  . INCISION AND DRAINAGE ABSCESS     multiple I&Ds  .  INCISION AND DRAINAGE ABSCESS Left 07/09/2012   Procedure: DRESSING CHANGE, THIGH WOUND;  Surgeon: Harl Bowie, MD;  Location: [facility];  Service: General;  Laterality: Left;  . INCISION AND DRAINAGE OF WOUND Left 07/07/2012   Procedure: IRRIGATION AND DEBRIDEMENT WOUND;  Surgeon: Harl Bowie, MD;  Location: [facility];  Service: General;  Laterality: Left;  . INCISION AND DRAINAGE PERIRECTAL ABSCESS Left 07/14/2012   Procedure: DEBRIDEMENT OF SKIN & SOFT TISSUE; DRESSING CHANGE UNDER ANESTHESIA;  Surgeon: Gayland Curry, MD,FACS;  Location: [facility];  Service: General;  Laterality: Left;  . INCISION AND DRAINAGE PERIRECTAL ABSCESS Left 07/16/2012   Procedure: I&D Left Thigh;  Surgeon: Gwenyth Ober, MD;  Location: [facility];  Service: General;  Laterality: Left;  . INCISION AND DRAINAGE PERIRECTAL ABSCESS N/A 01/05/2015   Procedure: IRRIGATION AND DEBRIDEMENT PERIRECTAL ABSCESS;  Surgeon: Donnie Mesa, MD;  Location: [facility];  Service: General;  Laterality: N/A;  . IRRIGATION AND DEBRIDEMENT ABSCESS Left 07/06/2012   Procedure: IRRIGATION AND DEBRIDEMENT ABSCESS BUTTOCKS AND THIGH;  Surgeon: Shann Medal, MD;  Location: [facility];  Service: General;  Laterality: Left;  . IRRIGATION AND DEBRIDEMENT ABSCESS Left 08/10/2012   Procedure: IRRIGATION AND DEBRIDEMENT ABSCESS;  Surgeon: Madilyn Hook, DO;  Location: [facility];  Service: General;  Laterality: Left;  . PARS PLANA VITRECTOMY Right 12/31/2019   Procedure: PARS PLANA VITRECTOMY WITH 25 GAUGE;  Surgeon: Jalene Mullet, MD;  Location: [facility];  Service: Ophthalmology;  Laterality: Right;  . PHOTOCOAGULATION WITH LASER Right 12/31/2019   Procedure: PHOTOCOAGULATION WITH LASER;  Surgeon: Jalene Mullet, MD;  Location: [facility];  Service: Ophthalmology;  Laterality: Right;  . REPAIR OF COMPLEX TRACTION RETINAL DETACHMENT Right 12/31/2019   Procedure: REPAIR OF COMPLEX TRACTION RETINAL;  Surgeon: Jalene Mullet, MD;  Location: [facility];  Service: Ophthalmology;   Laterality: Right;  . STUMP REVISION Left 06/09/2017   Procedure: REVISION LEFT BELOW KNEE AMPUTATION;  Surgeon: Newt Minion, MD;  Location: [facility];  Service: Orthopedics;  Laterality: Left;  . STUMP REVISION Left 09/04/2019   Procedure: LEFT BELOW KNEE AMPUTATION REVISION;  Surgeon: Newt Minion, MD;  Location: [facility];  Service: Orthopedics;  Laterality: Left;  . STUMP REVISION Left 09/20/2019   Procedure: LEFT BELOW KNEE AMPUTATION REVISION;  Surgeon: Newt Minion, MD;  Location: [facility];  Service: Orthopedics;  Laterality: Left;  . TONSILLECTOMY  1994    There were no vitals filed for this visit.   Subjective Assessment - 06/10/20 1021    Subjective No falls. Pain is "okay". Unsure how it really is as she has not been moving in the prosthesis much this am. To clinic  in wheelchair today due to not trusing the prosthesis in the snow/ice.    Limitations Walking;House hold activities;Standing    Patient Stated Goals "I want to be able to walk on this leg and move around"    Currently in Pain? Yes    Pain Score 5     Pain Location Knee    Pain Orientation Right    Pain Descriptors / Indicators Aching    Pain Type Chronic pain    Pain Onset More than a month ago    Pain Frequency Constant    Aggravating Factors  standing for long periods of time    Pain Relieving Factors heat/medication                [facility] Adult PT Treatment/Exercise - 06/10/20 1027      Transfers   Transfers Sit to Stand;Stand to Sit    Sit to Stand 5: Supervision;With upper extremity assist;From chair/3-in-1    Stand to Sit 5: Supervision;With upper extremity assist;To chair/3-in-1      Ambulation/Gait   Ambulation/Gait Yes    Ambulation/Gait Assistance 5: Supervision    Ambulation Distance (Feet) 115 Feet   x2   Assistive device Rolling walker;Prosthesis    Gait Pattern Step-through pattern;Decreased stride length;Decreased stance time - left;Decreased step length - right;Decreased trunk rotation;Trunk  flexed;Wide base of support    Ambulation Surface Level;Indoor      Neuro Re-ed    Neuro Re-ed Details  for balance/muscle re-ed: unsupported standing for the following activities: feet apart for EC 30 sec's x 3 reps, progressing to EC head movements left<>right, up<>down for ~8 reps each before needing a rest break with UE support on walker due to back pain. then without support with red band pt performed alternating 3 way pulls, then rows for 10 reps each with cues on posture and ex form. min guard assist for safety.      Prosthetics   Current prosthetic wear tolerance (days/week)  daily     Current prosthetic wear tolerance (#hours/day)  wore the 4 hours 2x a day on monday and plans to so today/tomorrow. was only 3 hours 2x day yesterday.    Residual limb condition  Pt reports no skin issues    Donning Prosthesis Modified independent (device/increased time)                    PT Short Term Goals - 04/09/20 1346      PT SHORT TERM GOAL #1   Title Pt will be independent with ongoing standing HEP with prosthesis.   (Target Date: 05/09/20)    Baseline met per pt report    Time 4    Period Weeks    Status On-going    Target Date 05/09/20      PT SHORT TERM GOAL #2   Title Pt will be able to tolerate wearing prosthesis 5 hrs, 2x/day with no skin issues and with good understanding of swelling management.    Time 4    Period Weeks    Status Revised      PT SHORT TERM GOAL #3   Title Pt will  perform dynamic tasks with intermittent UE support x 10 mins at S level and reach to floor level to retrieve item.    Time 4    Period Weeks    Status Revised      PT SHORT TERM GOAL #4   Title Pt will ambulate x 300' with RW at [facility] I  level without rest break in order to indicate improved activity tolerance.    Time 4    Period Weeks    Status Revised      PT SHORT TERM GOAL #5   Title Pt will improve gait speed to >/=1.17 ft/sec w/ RW and prosthesis to indicate dec fall risk.     Time 4    Period Weeks    Status Revised             PT Long Term Goals - 06/03/20 1050      PT LONG TERM GOAL #1   Title Pt will be independent with final HEP in order to indicate improved functional mobility and balance with use of prosthesis.  (Target Date: 07/03/20)    Time 4    Period Weeks    Status On-going      PT LONG TERM GOAL #2   Title Pt will be able to tolerate wearing for 90% of awake hours without skin breakdown and with good understanding of swelling mangement.    Baseline 3hrs, 2x/day on 06/03/20, is donning leg independently    Time 8    Period Weeks    Status On-going      PT LONG TERM GOAL #3   Title Pt will tolerate standing x 15 mins while performing tasks intermittently without UE support and pick up item from floor with single UE support at mod I level.    Baseline able to stand for 5.30 mins with intermittent UE support, can reach forward 10" and can reach item from floor.    Time 4    Period Weeks    Status Revised      PT LONG TERM GOAL #4   Title Pt will improve gait speed to >/=1.00 ft/sec with LRAD and prosthesis to indicate dec fall risk.    Baseline .3 ft/sec with RW at S level successfully flexing knee throughout gait, .70 ft/sec on 06/03/20    Time 4    Period Weeks    Status Revised      PT LONG TERM GOAL #5   Title Pt will ambulate x 25' w/ SBQC (or LRAD) and prosthesis at min A level in order to indicate improved independence with gait.    Baseline 10' at min A level    Time 4    Period Weeks    Status On-going                 Plan - 06/10/20 1023    Clinical Impression Statement Today's skilled session continued to focus on gait mechanics with prosthesis/WRW and on balance with decreased UE support. Rest breaks needed due to fatigue and increased lower back pain/discomfort, both of which improved with the rest breaks.    Personal Factors and Comorbidities Comorbidity 3+;Past/Current Experience    Comorbidities see above     Examination-Activity Limitations Bend;Carry;Dressing;Stand;Stairs;Squat;Locomotion Level;Transfers    Examination-Participation Restrictions Community Activity    Stability/Clinical Decision Making Evolving/Moderate complexity    Rehab Potential Good    PT Frequency 2x / week    PT Duration 4 weeks    PT Treatment/Interventions ADLs/Self Care Home Management;DME Instruction;Gait training;Stair training;Functional mobility training;Therapeutic activities;Therapeutic exercise;Balance training;Neuromuscular re-education;Patient/family education;Prosthetic Training;Compression bandaging;Passive range of motion;Energy conservation    PT Next Visit Plan Continue with usupported standing balance, sit<>stand, gait with knee flexion-having her ride prosthesis all the way over stance, dynamic balance with decreasing UE support, standing strengthening (if unable to tolerate due to knee pain, give  supine strengthening). gait with small base quad cane as able, has been limited due to knee pain.    Consulted and Agree with Plan of Care Patient           Patient will benefit from skilled therapeutic intervention in order to improve the following deficits and impairments:  Abnormal gait,Decreased activity tolerance,Decreased balance,Decreased endurance,Decreased knowledge of precautions,Decreased knowledge of use of DME,Decreased mobility,Decreased range of motion,Decreased strength,Difficulty walking,Increased edema,Impaired perceived functional ability,Impaired flexibility,Impaired sensation,Postural dysfunction,Improper body mechanics,Prosthetic Dependency  Visit Diagnosis: Unsteadiness on feet  Other abnormalities of gait and mobility  Abnormal posture  Repeated falls     Problem List Patient Active Problem List   Diagnosis Date Noted  . Cough 05/26/2020  . Chronic pain syndrome 01/16/2020  . Hx of AKA (above knee amputation), left ([place])   . Symptomatic anemia 10/21/2019  . Anemia 10/21/2019   . Acute respiratory failure with hypoxia ([place]) 10/21/2019  . Blurry vision, right eye 10/08/2019  . Tobacco use 03/01/2019  . Phantom limb pain ([place]) 11/08/2018  . Current moderate episode of major depressive disorder ([place])   . Hyperkalemia   . Anemia of chronic disease   . Severe protein-calorie malnutrition ([place])   . Fall   . Status post below knee amputation, left ([place]) 04/11/2017  . Bipolar affective disorder ([place])   . Heart failure with preserved ejection fraction ([place]) 02/09/2017  . Routine adult health maintenance 12/08/2016  . Chronic pain of right knee 09/08/2016  . Chronic kidney disease (CKD), stage III (moderate) ([place]) 07/15/2016  . OSA (obstructive sleep apnea) 05/13/2016  . Morbid obesity due to excess calories ([place]) 11/27/2015  . Hypothyroidism 04/25/2013  . Controlled type 2 diabetes mellitus ([place]) 07/12/2012  . Carpal tunnel syndrome, bilateral 01/25/2012  . Diabetic polyneuropathy associated with type 2 diabetes mellitus ([place]) 07/04/2011  . Anxiety and depression 06/02/2011  . GERD 12/06/2007  . Hyperlipidemia 08/29/2007  . Hypertension associated with diabetes ([place]) 08/29/2007   Willow Ora, PTA, [facility] [address], [place], [place] [address] [phone] 06/10/20, 5:03 PM   Name: [name] [name] MRN: [id number] Date of Birth: 26-Feb-1976

## 2020-06-12 ENCOUNTER — Ambulatory Visit: Payer: Medicare HMO | Admitting: Physical Therapy

## 2020-06-16 ENCOUNTER — Ambulatory Visit: Payer: Medicare HMO | Admitting: Physical Therapy

## 2020-06-17 ENCOUNTER — Encounter: Payer: Self-pay | Admitting: Physician Assistant

## 2020-06-17 ENCOUNTER — Ambulatory Visit (INDEPENDENT_AMBULATORY_CARE_PROVIDER_SITE_OTHER): Payer: Medicare HMO | Admitting: Physician Assistant

## 2020-06-17 ENCOUNTER — Ambulatory Visit: Payer: Medicare HMO | Admitting: Physical Therapy

## 2020-06-17 ENCOUNTER — Encounter: Payer: Self-pay | Admitting: Physical Therapy

## 2020-06-17 ENCOUNTER — Other Ambulatory Visit: Payer: Self-pay

## 2020-06-17 DIAGNOSIS — R2689 Other abnormalities of gait and mobility: Secondary | ICD-10-CM | POA: Diagnosis not present

## 2020-06-17 DIAGNOSIS — M25652 Stiffness of left hip, not elsewhere classified: Secondary | ICD-10-CM | POA: Diagnosis not present

## 2020-06-17 DIAGNOSIS — R2681 Unsteadiness on feet: Secondary | ICD-10-CM | POA: Diagnosis not present

## 2020-06-17 DIAGNOSIS — M25561 Pain in right knee: Secondary | ICD-10-CM | POA: Diagnosis not present

## 2020-06-17 DIAGNOSIS — R293 Abnormal posture: Secondary | ICD-10-CM

## 2020-06-17 DIAGNOSIS — R296 Repeated falls: Secondary | ICD-10-CM | POA: Diagnosis not present

## 2020-06-17 DIAGNOSIS — T8781 Dehiscence of amputation stump: Secondary | ICD-10-CM

## 2020-06-17 MED ORDER — LIDOCAINE HCL 1 % IJ SOLN
5.0000 mL | INTRAMUSCULAR | Status: AC | PRN
  Administered 2020-06-17: 5 mL

## 2020-06-17 MED ORDER — METHYLPREDNISOLONE ACETATE 40 MG/ML IJ SUSP
40.0000 mg | INTRAMUSCULAR | Status: AC | PRN
  Administered 2020-06-17: 40 mg via INTRA_ARTICULAR

## 2020-06-17 NOTE — Progress Notes (Signed)
Office Visit Note   Patient: [name] [name]           Date of Birth: 1975/06/26           MRN: [id number] Visit Date: 06/17/2020              Requested by: Riesa Pope, MD [place],  [place] [id number] PCP: Riesa Pope, MD  Chief Complaint  Patient presents with  . Left Leg - Follow-up    Needs orders for new socket  . Right Knee - Follow-up    Would like injection      HPI: Patient is a pleasant 45 year old woman who comes in for 2 reasons today.  First she is status post left above-knee amputation and is in need of a new socket.  She is tried several modifications and several thickness socket areas and still feels unstable and feels like there is air in the socket.  She is concerned that she will fall and she is currently not wearing her socket because of this.  Also she is requesting a steroid injection into her right knee.  She has had these in the past and done well.  Assessment & Plan: Visit Diagnoses: No diagnosis found.  Plan: I have recommended the patient do a compression wrap on her right lower extremity today.  She says she is not going to do that but she will elevate her leg and she is to follow-up with me in 1 week.  If she still had significant swelling I would again recommend a compression wrap.  She does state that the swelling became when she fell asleep in her wheelchair.  Follow-Up Instructions: No follow-ups on file.   Ortho Exam  Patient is alert, oriented, no adenopathy, well-dressed, normal affect, normal respiratory effort. Right lower extremity massive amount of soft tissue swelling however distal pulses are intact she has no cellulitis no open ulcers.  Tender to palpation over the right knee joint medially more than laterally.  Left amputation stump has no open areas or ulcers.  No wound dehiscence.  No significant swelling.  Patient is an existing left transfemoral amputee.  Patient's current comorbidities are not  expected to impact the ability to function with the prescribed prosthesis. Patient verbally communicates a strong desire to use a prosthesis. Patient currently requires mobility aids to ambulate without a prosthesis.  Expects not to use mobility aids with a new prosthesis.  Patient is a K3 level ambulator that spends a lot of time walking around on uneven terrain over obstacles, up and down stairs, and ambulates with a variable cadence.    Imaging: No results found. No images are attached to the encounter.  Labs: Lab Results  Component Value Date   HGBA1C 8.5 (A) 02/13/2020   HGBA1C 8.5 (H) 09/04/2019   HGBA1C 7.0 (A) 05/30/2019   ESRSEDRATE 130 (H) 03/24/2017   ESRSEDRATE 53 (H) 09/14/2015   ESRSEDRATE 132 (H) 07/06/2012   CRP 13.6 (H) 02/14/2019   CRP 38.6 (H) 04/03/2018   CRP 5.4 (H) 09/14/2015   LABURIC 8.3 (H) 04/15/2019   REPTSTATUS 10/27/2019 FINAL 10/22/2019   GRAMSTAIN  09/04/2019    FEW WBC PRESENT, PREDOMINANTLY PMN MODERATE GRAM POSITIVE COCCI    CULT  10/22/2019    NO GROWTH 5 DAYS Performed at [facility], [place] [address], [place], [place] [address]    [place] 09/04/2019     Lab Results  Component Value Date   ALBUMIN 2.4 (  L) 10/21/2019   ALBUMIN 2.3 (L) 09/18/2019   ALBUMIN 1.8 (L) 09/01/2019   LABURIC 8.3 (H) 04/15/2019    Lab Results  Component Value Date   MG 2.3 04/07/2018   MG 2.1 04/05/2018   MG 2.4 03/25/2017   No results found for: VD25OH  No results found for: PREALBUMIN CBC EXTENDED Latest Ref Rng & Units 02/11/2020 12/31/2019 11/07/2019  WBC 4.0 - 10.5 K/uL 5.2 6.0 5.4  RBC 3.87 - 5.11 MIL/uL 4.28 3.93 3.98  HGB 12.0 - 15.0 g/dL 10.9(L) 10.0(L) 10.1(L)  HCT 36.0 - 46.0 % 34.8(L) 32.3(L) 32.0(L)  PLT 150 - 400 K/uL 284 222 291  NEUTROABS 1.7 - 7.7 K/uL - - -  LYMPHSABS 0.7 - 4.0 K/uL - - -     There is no height or weight on file to calculate BMI.  Orders:  No orders of the defined types were placed  in this encounter.  No orders of the defined types were placed in this encounter.    Procedures: Large Joint Inj: R knee on 06/17/2020 2:25 PM Indications: pain and diagnostic evaluation Details: 22 G 1.5 in needle, anteromedial approach  Arthrogram: No  Medications: 40 mg methylPREDNISolone acetate 40 MG/ML; 5 mL lidocaine 1 % Outcome: tolerated well, no immediate complications Procedure, treatment alternatives, risks and benefits explained, specific risks discussed. Consent was given by the patient. Immediately prior to procedure a time out was called to verify the correct patient, procedure, equipment, support staff and site/side marked as required. Patient was prepped and draped in the usual sterile fashion.      Clinical Data: No additional findings.  ROS:  All other systems negative, except as noted in the HPI. Review of Systems  Objective: Vital Signs: There were no vitals taken for this visit.  Specialty Comments:  No specialty comments available.  PMFS History: Patient Active Problem List   Diagnosis Date Noted  . Cough 05/26/2020  . Chronic pain syndrome 01/16/2020  . Hx of AKA (above knee amputation), left ([place])   . Symptomatic anemia 10/21/2019  . Anemia 10/21/2019  . Acute respiratory failure with hypoxia ([place]) 10/21/2019  . Blurry vision, right eye 10/08/2019  . Tobacco use 03/01/2019  . Phantom limb pain ([place]) 11/08/2018  . Current moderate episode of major depressive disorder ([place])   . Hyperkalemia   . Anemia of chronic disease   . Severe protein-calorie malnutrition ([place])   . Fall   . Status post below knee amputation, left ([place]) 04/11/2017  . Bipolar affective disorder ([place])   . Heart failure with preserved ejection fraction ([place]) 02/09/2017  . Routine adult health maintenance 12/08/2016  . Chronic pain of right knee 09/08/2016  . Chronic kidney disease (CKD), stage III (moderate) ([place]) 07/15/2016  . OSA (obstructive sleep apnea) 05/13/2016  .  Morbid obesity due to excess calories ([place]) 11/27/2015  . Hypothyroidism 04/25/2013  . Controlled type 2 diabetes mellitus ([place]) 07/12/2012  . Carpal tunnel syndrome, bilateral 01/25/2012  . Diabetic polyneuropathy associated with type 2 diabetes mellitus ([place]) 07/04/2011  . Anxiety and depression 06/02/2011  . GERD 12/06/2007  . Hyperlipidemia 08/29/2007  . Hypertension associated with diabetes ([place]) 08/29/2007   Past Medical History:  Diagnosis Date  . Abdominal muscle pain 09/08/2016  . Abnormal Pap smear of cervix 2009  . Abscess    history of multiple abscesses  . Acute bilateral low back pain 02/13/2017  . Acute blood loss anemia   . Acute on chronic renal failure ([place]) 07/12/2012  .  Acute renal failure ([place]) 07/12/2012  . Adjustment disorder with depressed mood 03/26/2017  . AKI (acute kidney injury) ([place])   . Anemia of chronic disease 2002  . Anxiety    Panic attacks  . Bilateral lower extremity edema 05/13/2016  . Bipolar disorder ([place])   . Cataract    B/L cataract  . Cellulitis 05/21/2014   right eye  . Chronic bronchitis ([place])    "get it q yr" (05/13/2013)  . Chronic pain   . Dehiscence of amputation stump ([place])   . Depression   . Edema of lower extremity   . Endocarditis 2002   subacute bacterial endocarditis.   . Family history of anesthesia complication    "my mom has a hard time coming out from under"  . Fibromyalgia   . GERD (gastroesophageal reflux disease)    occ  . Heart murmur   . Herpes simplex type 1 infection 01/16/2018  . History of blood transfusion    "just low blood count" (05/13/2013)  . Hyperlipidemia   . Hypertension   . Hypothyroidism   . Hypothyroidism, adult 03/21/2014  . Hypoxia   . Leukocytosis   . Necrosis ([place])    and ulceration  . Necrotizing fasciitis s/p OR debridements 07/06/2012  . Obesity   . OSA on CPAP    does not wear all the time  . Peripheral neuropathy   . Pneumonia   . Type II diabetes mellitus ([facility])    Type  II   . Wound dehiscence 09/04/2019    Family History  Problem Relation Age of Onset  . Heart failure Mother   . Diabetes Mother   . Kidney disease Mother   . Kidney disease Father   . Diabetes Father   . Diabetes Paternal Grandmother   . Heart failure Paternal Grandmother   . Other Other     Past Surgical History:  Procedure Laterality Date  . ABDOMINAL AORTOGRAM W/LOWER EXTREMITY Left 03/19/2019   Procedure: ABDOMINAL AORTOGRAM W/LOWER EXTREMITY;  Surgeon: Serafina Mitchell, MD;  Location: [facility];  Service: Cardiovascular;  Laterality: Left;  . ABOVE KNEE LEG AMPUTATION Left 10/11/2019   wound dehisence   . AIR/FLUID EXCHANGE Right 12/31/2019   Procedure: AIR/FLUID EXCHANGE;  Surgeon: Jalene Mullet, MD;  Location: [facility];  Service: Ophthalmology;  Laterality: Right;  . AMPUTATION Left 04/07/2017   Procedure: LEFT BELOW KNEE AMPUTATION;  Surgeon: Newt Minion, MD;  Location: [facility];  Service: Orthopedics;  Laterality: Left;  . AMPUTATION Left 10/11/2019   Procedure: LEFT ABOVE KNEE AMPUTATION;  Surgeon: Newt Minion, MD;  Location: [facility];  Service: Orthopedics;  Laterality: Left;  . CATARACT EXTRACTION W/PHACO Right 02/11/2020   Procedure: CATARACT EXTRACTION PHACO AND INTRAOCULAR LENS PLACEMENT (IOC);  Surgeon: Jalene Mullet, MD;  Location: [facility];  Service: Ophthalmology;  Laterality: Right;  . CORONARY ARTERY BYPASS GRAFT    . EYE SURGERY     [place]  . GAS INSERTION Right 12/31/2019   Procedure: INSERTION OF GAS;  Surgeon: Jalene Mullet, MD;  Location: [facility];  Service: Ophthalmology;  Laterality: Right;  . INCISION AND DRAINAGE ABSCESS     multiple I&Ds  . INCISION AND DRAINAGE ABSCESS Left 07/09/2012   Procedure: DRESSING CHANGE, THIGH WOUND;  Surgeon: Harl Bowie, MD;  Location: [facility];  Service: General;  Laterality: Left;  . INCISION AND DRAINAGE OF WOUND Left 07/07/2012   Procedure: IRRIGATION AND DEBRIDEMENT WOUND;  Surgeon: Harl Bowie, MD;  [facility];  Service: General;  Laterality: Left;  . INCISION AND DRAINAGE PERIRECTAL ABSCESS Left 07/14/2012   Procedure: DEBRIDEMENT OF SKIN & SOFT TISSUE; DRESSING CHANGE UNDER ANESTHESIA;  Surgeon: Gayland Curry, MD,FACS;  Location: [facility];  Service: General;  Laterality: Left;  . INCISION AND DRAINAGE PERIRECTAL ABSCESS Left 07/16/2012   Procedure: I&D Left Thigh;  Surgeon: Gwenyth Ober, MD;  Location: [facility];  Service: General;  Laterality: Left;  . INCISION AND DRAINAGE PERIRECTAL ABSCESS N/A 01/05/2015   Procedure: IRRIGATION AND DEBRIDEMENT PERIRECTAL ABSCESS;  Surgeon: Donnie Mesa, MD;  Location: [facility];  Service: General;  Laterality: N/A;  . IRRIGATION AND DEBRIDEMENT ABSCESS Left 07/06/2012   Procedure: IRRIGATION AND DEBRIDEMENT ABSCESS BUTTOCKS AND THIGH;  Surgeon: Shann Medal, MD;  Location: [facility];  Service: General;  Laterality: Left;  . IRRIGATION AND DEBRIDEMENT ABSCESS Left 08/10/2012   Procedure: IRRIGATION AND DEBRIDEMENT ABSCESS;  Surgeon: Madilyn Hook, DO;  Location: [facility];  Service: General;  Laterality: Left;  . PARS PLANA VITRECTOMY Right 12/31/2019   Procedure: PARS PLANA VITRECTOMY WITH 25 GAUGE;  Surgeon: Jalene Mullet, MD;  Location: [facility];  Service: Ophthalmology;  Laterality: Right;  . PHOTOCOAGULATION WITH LASER Right 12/31/2019   Procedure: PHOTOCOAGULATION WITH LASER;  Surgeon: Jalene Mullet, MD;  Location: [facility];  Service: Ophthalmology;  Laterality: Right;  . REPAIR OF COMPLEX TRACTION RETINAL DETACHMENT Right 12/31/2019   Procedure: REPAIR OF COMPLEX TRACTION RETINAL;  Surgeon: Jalene Mullet, MD;  Location: [facility];  Service: Ophthalmology;  Laterality: Right;  . STUMP REVISION Left 06/09/2017   Procedure: REVISION LEFT BELOW KNEE AMPUTATION;  Surgeon: Newt Minion, MD;  Location: [facility];  Service: Orthopedics;  Laterality: Left;  . STUMP REVISION Left 09/04/2019   Procedure: LEFT BELOW KNEE AMPUTATION REVISION;  Surgeon: Newt Minion, MD;  Location: [facility];  Service: Orthopedics;  Laterality: Left;  . STUMP REVISION Left 09/20/2019   Procedure: LEFT BELOW KNEE AMPUTATION REVISION;  Surgeon: Newt Minion, MD;  Location: [facility];  Service: Orthopedics;  Laterality: Left;  . TONSILLECTOMY  1994   Social History   Occupational History  . Occupation: disability  Tobacco Use  . Smoking status: Former Smoker    Packs/day: 0.50    Years: 16.00    Pack years: 8.00    Types: Cigarettes    Quit date: 07/22/2019    Years since quitting: 0.9  . Smokeless tobacco: Never Used  Vaping Use  . Vaping Use: Former  . Devices: CBD oil  Substance and Sexual Activity  . Alcohol use: Yes    Alcohol/week: 0.0 standard drinks    Comment: social  . Drug use: No  . Sexual activity: Yes

## 2020-06-17 NOTE — Patient Instructions (Signed)
Access Code: [email or web address] URL: [email or web address] Date: 06/17/2020 Prepared by: Willow Ora  Exercises Supine Posterior Pelvic Tilt - 1 x daily - 5 x weekly - 1 sets - 10 reps - 5 hold Supine Single Knee to Chest Stretch - 1 x daily - 5 x weekly - 1 sets - 3 reps - 30 hold Seated Hamstring Stretch - 1 x daily - 5 x weekly - 1 sets - 3 reps - 30 hold Seated Pelvic Tilt - 1 x daily - 5 x weekly - 1 sets - 10 reps Seated Lateral Pelvic Tilt on Swiss Ball - 1 x daily - 5 x weekly - 1 sets - 10 reps

## 2020-06-17 NOTE — Therapy (Signed)
[facility] [address] [place], [place], [address] Phone: [phone]   Fax:  [phone]  Physical Therapy Treatment  Patient Details  Name: [name] [name] MRN: [id number] Date of Birth: 1975-05-29 Referring Provider (PT): Meridee Score, MD   Encounter Date: 06/17/2020   PT End of Session - 06/17/20 1027    Visit Number 17    Number of Visits 27   per updated POC   Date for PT Re-Evaluation 07/03/20   updated POC   Authorization Type [employer] [place] and Medicaid (10th visit needed)    Authorization Time Period 7 from 06/03/20- 2/11    Authorization - Visit Number 3    Authorization - Number of Visits 7    Progress Note Due on Visit 20    PT Start Time 1017    PT Stop Time 1100    PT Time Calculation (min) 43 min    Equipment Utilized During Treatment Gait belt    Activity Tolerance Patient tolerated treatment well;No increased pain;Patient limited by fatigue    Behavior During Therapy [facility] for tasks assessed/performed           Past Medical History:  Diagnosis Date  . Abdominal muscle pain 09/08/2016  . Abnormal Pap smear of cervix 2009  . Abscess    history of multiple abscesses  . Acute bilateral low back pain 02/13/2017  . Acute blood loss anemia   . Acute on chronic renal failure ([place]) 07/12/2012  . Acute renal failure ([place]) 07/12/2012  . Adjustment disorder with depressed mood 03/26/2017  . AKI (acute kidney injury) ([place])   . Anemia of chronic disease 2002  . Anxiety    Panic attacks  . Bilateral lower extremity edema 05/13/2016  . Bipolar disorder ([place])   . Cataract    B/L cataract  . Cellulitis 05/21/2014   right eye  . Chronic bronchitis ([place])    "get it q yr" (05/13/2013)  . Chronic pain   . Dehiscence of amputation stump ([place])   . Depression   . Edema of lower extremity   . Endocarditis 2002   subacute bacterial endocarditis.   . Family history of anesthesia complication    "my mom has a  hard time coming out from under"  . Fibromyalgia   . GERD (gastroesophageal reflux disease)    occ  . Heart murmur   . Herpes simplex type 1 infection 01/16/2018  . History of blood transfusion    "just low blood count" (05/13/2013)  . Hyperlipidemia   . Hypertension   . Hypothyroidism   . Hypothyroidism, adult 03/21/2014  . Hypoxia   . Leukocytosis   . Necrosis ([place])    and ulceration  . Necrotizing fasciitis s/p OR debridements 07/06/2012  . Obesity   . OSA on CPAP    does not wear all the time  . Peripheral neuropathy   . Pneumonia   . Type II diabetes mellitus (HCC)    Type  II  . Wound dehiscence 09/04/2019    Past Surgical History:  Procedure Laterality Date  . ABDOMINAL AORTOGRAM W/LOWER EXTREMITY Left 03/19/2019   Procedure: ABDOMINAL AORTOGRAM W/LOWER EXTREMITY;  Surgeon: Serafina Mitchell, MD;  Location: [facility];  Service: Cardiovascular;  Laterality: Left;  . ABOVE KNEE LEG AMPUTATION Left 10/11/2019   wound dehisence   . AIR/FLUID EXCHANGE Right 12/31/2019   Procedure: AIR/FLUID EXCHANGE;  Surgeon: Jalene Mullet, MD;  Location: [facility];  Service: Ophthalmology;  Laterality: Right;  .  AMPUTATION Left 04/07/2017   Procedure: LEFT BELOW KNEE AMPUTATION;  Surgeon: Newt Minion, MD;  Location: [facility];  Service: Orthopedics;  Laterality: Left;  . AMPUTATION Left 10/11/2019   Procedure: LEFT ABOVE KNEE AMPUTATION;  Surgeon: Newt Minion, MD;  Location: [facility];  Service: Orthopedics;  Laterality: Left;  . CATARACT EXTRACTION W/PHACO Right 02/11/2020   Procedure: CATARACT EXTRACTION PHACO AND INTRAOCULAR LENS PLACEMENT (IOC);  Surgeon: Jalene Mullet, MD;  Location: [facility];  Service: Ophthalmology;  Laterality: Right;  . CORONARY ARTERY BYPASS GRAFT    . EYE SURGERY     lazer  . GAS INSERTION Right 12/31/2019   Procedure: INSERTION OF GAS;  Surgeon: Jalene Mullet, MD;  Location: [facility];  Service: Ophthalmology;  Laterality: Right;  . INCISION AND DRAINAGE  ABSCESS     multiple I&Ds  . INCISION AND DRAINAGE ABSCESS Left 07/09/2012   Procedure: DRESSING CHANGE, THIGH WOUND;  Surgeon: Harl Bowie, MD;  Location: [facility];  Service: General;  Laterality: Left;  . INCISION AND DRAINAGE OF WOUND Left 07/07/2012   Procedure: IRRIGATION AND DEBRIDEMENT WOUND;  Surgeon: Harl Bowie, MD;  Location: [facility];  Service: General;  Laterality: Left;  . INCISION AND DRAINAGE PERIRECTAL ABSCESS Left 07/14/2012   Procedure: DEBRIDEMENT OF SKIN & SOFT TISSUE; DRESSING CHANGE UNDER ANESTHESIA;  Surgeon: Gayland Curry, MD,FACS;  Location: [facility];  Service: General;  Laterality: Left;  . INCISION AND DRAINAGE PERIRECTAL ABSCESS Left 07/16/2012   Procedure: I&D Left Thigh;  Surgeon: Gwenyth Ober, MD;  Location: [facility];  Service: General;  Laterality: Left;  . INCISION AND DRAINAGE PERIRECTAL ABSCESS N/A 01/05/2015   Procedure: IRRIGATION AND DEBRIDEMENT PERIRECTAL ABSCESS;  Surgeon: Donnie Mesa, MD;  Location: [facility];  Service: General;  Laterality: N/A;  . IRRIGATION AND DEBRIDEMENT ABSCESS Left 07/06/2012   Procedure: IRRIGATION AND DEBRIDEMENT ABSCESS BUTTOCKS AND THIGH;  Surgeon: Shann Medal, MD;  Location: [facility];  Service: General;  Laterality: Left;  . IRRIGATION AND DEBRIDEMENT ABSCESS Left 08/10/2012   Procedure: IRRIGATION AND DEBRIDEMENT ABSCESS;  Surgeon: Madilyn Hook, DO;  Location: [facility];  Service: General;  Laterality: Left;  . PARS PLANA VITRECTOMY Right 12/31/2019   Procedure: PARS PLANA VITRECTOMY WITH 25 GAUGE;  Surgeon: Jalene Mullet, MD;  Location: [facility];  Service: Ophthalmology;  Laterality: Right;  . PHOTOCOAGULATION WITH LASER Right 12/31/2019   Procedure: PHOTOCOAGULATION WITH LASER;  Surgeon: Jalene Mullet, MD;  Location: [facility];  Service: Ophthalmology;  Laterality: Right;  . REPAIR OF COMPLEX TRACTION RETINAL DETACHMENT Right 12/31/2019   Procedure: REPAIR OF COMPLEX TRACTION RETINAL;  Surgeon: Jalene Mullet, MD;  Location: [facility];   Service: Ophthalmology;  Laterality: Right;  . STUMP REVISION Left 06/09/2017   Procedure: REVISION LEFT BELOW KNEE AMPUTATION;  Surgeon: Newt Minion, MD;  Location: [facility];  Service: Orthopedics;  Laterality: Left;  . STUMP REVISION Left 09/04/2019   Procedure: LEFT BELOW KNEE AMPUTATION REVISION;  Surgeon: Newt Minion, MD;  Location: [facility];  Service: Orthopedics;  Laterality: Left;  . STUMP REVISION Left 09/20/2019   Procedure: LEFT BELOW KNEE AMPUTATION REVISION;  Surgeon: Newt Minion, MD;  Location: [facility];  Service: Orthopedics;  Laterality: Left;  . TONSILLECTOMY  1994    There were no vitals filed for this visit.   Subjective Assessment - 06/17/20 1024    Subjective Saw Gerald Stabs with [facility] last Wednesday. Needs new socket as the other socket is ill fitting and liner  not adhering to limb- getting air pockets, which can cause leg to fall off. See's Dr. Sharol Given today for face to face to get script and right knee injection for pain. Once she has script can call and get appt for casting with Gerald Stabs. Pt unsure what he is doing with the knee, whether he keeps it offset or centers it like she wants.    Limitations Walking;House hold activities;Standing    Patient Stated Goals "I want to be able to walk on this leg and move around"    Currently in Pain? Yes    Pain Score 6     Pain Location Knee    Pain Orientation Right    Pain Descriptors / Indicators Aching;Burning;Sore    Pain Type Chronic pain    Pain Onset More than a month ago    Pain Frequency Constant    Aggravating Factors  standing for long periors of time    Pain Relieving Factors heat/medication, injections              [facility] Adult PT Treatment/Exercise - 06/17/20 1035      Transfers   Transfers Lateral/Scoot Transfers    Lateral/Scoot Transfers 6: Modified independent (Device/Increase time)    Lateral/Scoot Transfer Details (indicate cue type and reason) wheelchair<>mat table      Self-Care   Self-Care Other  Self-Care Comments    Other Self-Care Comments  discussed plans for a new socket and injection into knee for pain. pt to go on hold until new socket is ready (~2-3 weeks from now).      Exercises   Exercises Other Exercises    Other Exercises  educated on ex's for core strengthening/stretching. Refer to [facility] for full details. Cues needed on correct form and technique.           Issued the following to HEP today: Access Code: [email or web address] URL: [email or web address] Date: 06/17/2020 Prepared by: Willow Ora  Exercises Supine Posterior Pelvic Tilt - 1 x daily - 5 x weekly - 1 sets - 10 reps - 5 hold Supine Single Knee to Chest Stretch - 1 x daily - 5 x weekly - 1 sets - 3 reps - 30 hold Seated Hamstring Stretch - 1 x daily - 5 x weekly - 1 sets - 3 reps - 30 hold Seated Pelvic Tilt - 1 x daily - 5 x weekly - 1 sets - 10 reps Seated Lateral Pelvic Tilt on Swiss Ball - 1 x daily - 5 x weekly - 1 sets - 10 reps        PT Education - 06/17/20 2225    Education Details HEP for strengthening/stretching; hold until new socket recieved and knee injection done    Person(s) Educated Patient    Methods Explanation;Demonstration;Verbal cues;Handout    Comprehension Verbalized understanding;Verbal cues required;Need further instruction            PT Short Term Goals - 04/09/20 1346      PT SHORT TERM GOAL #1   Title Pt will be independent with ongoing standing HEP with prosthesis.   (Target Date: 05/09/20)    Baseline met per pt report    Time 4    Period Weeks    Status On-going    Target Date 05/09/20      PT SHORT TERM GOAL #2   Title Pt will be able to tolerate wearing prosthesis 5 hrs, 2x/day with no skin issues and with good understanding of swelling management.    Time 4  Period Weeks    Status Revised      PT SHORT TERM GOAL #3   Title Pt will  perform dynamic tasks with intermittent UE support x 10 mins at S level and reach to floor level to retrieve  item.    Time 4    Period Weeks    Status Revised      PT SHORT TERM GOAL #4   Title Pt will ambulate x 300' with RW at mod I level without rest break in order to indicate improved activity tolerance.    Time 4    Period Weeks    Status Revised      PT SHORT TERM GOAL #5   Title Pt will improve gait speed to >/=1.17 ft/sec w/ RW and prosthesis to indicate dec fall risk.    Time 4    Period Weeks    Status Revised             PT Long Term Goals - 06/03/20 1050      PT LONG TERM GOAL #1   Title Pt will be independent with final HEP in order to indicate improved functional mobility and balance with use of prosthesis.  (Target Date: 07/03/20)    Time 4    Period Weeks    Status On-going      PT LONG TERM GOAL #2   Title Pt will be able to tolerate wearing for 90% of awake hours without skin breakdown and with good understanding of swelling mangement.    Baseline 3hrs, 2x/day on 06/03/20, is donning leg independently    Time 8    Period Weeks    Status On-going      PT LONG TERM GOAL #3   Title Pt will tolerate standing x 15 mins while performing tasks intermittently without UE support and pick up item from floor with single UE support at mod I level.    Baseline able to stand for 5.30 mins with intermittent UE support, can reach forward 10" and can reach item from floor.    Time 4    Period Weeks    Status Revised      PT LONG TERM GOAL #4   Title Pt will improve gait speed to >/=1.00 ft/sec with LRAD and prosthesis to indicate dec fall risk.    Baseline .48 ft/sec with RW at S level successfully flexing knee throughout gait, .70 ft/sec on 06/03/20    Time 4    Period Weeks    Status Revised      PT LONG TERM GOAL #5   Title Pt will ambulate x 25' w/ SBQC (or LRAD) and prosthesis at min A level in order to indicate improved independence with gait.    Baseline 10' at min A level    Time 4    Period Weeks    Status On-going                 Plan - 06/17/20  1028    Clinical Impression Statement Today's skilled session focused on issuing ex's to address lower back/limb pain/tightness to work on while on hold for new prosthesis socket. No issues reported or noted with performance in session (reported mild knee discomfort at times that resolved with rest to baseline levels). The pt is to see the MD today for the face to face to get order for new socket and injection for knee pain. She then plans to set up casting appt with Gerald Stabs with [facility]. Plans  to return to PT once new socket is ready. Will hold until then.    Personal Factors and Comorbidities Comorbidity 3+;Past/Current Experience    Comorbidities see above    Examination-Activity Limitations Bend;Carry;Dressing;Stand;Stairs;Squat;Locomotion Level;Transfers    Examination-Participation Restrictions Community Activity    Stability/Clinical Decision Making Evolving/Moderate complexity    Rehab Potential Good    PT Frequency 2x / week    PT Duration 4 weeks    PT Treatment/Interventions ADLs/Self Care Home Management;DME Instruction;Gait training;Stair training;Functional mobility training;Therapeutic activities;Therapeutic exercise;Balance training;Neuromuscular re-education;Patient/family education;Prosthetic Training;Compression bandaging;Passive range of motion;Energy conservation    PT Next Visit Plan On hold awaiting new socket; check goals for recert on return to therapy.    Consulted and Agree with Plan of Care Patient           Patient will benefit from skilled therapeutic intervention in order to improve the following deficits and impairments:  Abnormal gait,Decreased activity tolerance,Decreased balance,Decreased endurance,Decreased knowledge of precautions,Decreased knowledge of use of DME,Decreased mobility,Decreased range of motion,Decreased strength,Difficulty walking,Increased edema,Impaired perceived functional ability,Impaired flexibility,Impaired sensation,Postural  dysfunction,Improper body mechanics,Prosthetic Dependency  Visit Diagnosis: Unsteadiness on feet  Abnormal posture  Hip stiffness, left     Problem List Patient Active Problem List   Diagnosis Date Noted  . Cough 05/26/2020  . Chronic pain syndrome 01/16/2020  . Hx of AKA (above knee amputation), left ([place])   . Symptomatic anemia 10/21/2019  . Anemia 10/21/2019  . Acute respiratory failure with hypoxia ([place]) 10/21/2019  . Blurry vision, right eye 10/08/2019  . Tobacco use 03/01/2019  . Phantom limb pain ([place]) 11/08/2018  . Current moderate episode of major depressive disorder ([place])   . Hyperkalemia   . Anemia of chronic disease   . Severe protein-calorie malnutrition ([place])   . Fall   . Status post below knee amputation, left ([place]) 04/11/2017  . Bipolar affective disorder ([place])   . Heart failure with preserved ejection fraction ([place]) 02/09/2017  . Routine adult health maintenance 12/08/2016  . Chronic pain of right knee 09/08/2016  . Chronic kidney disease (CKD), stage III (moderate) ([place]) 07/15/2016  . OSA (obstructive sleep apnea) 05/13/2016  . Morbid obesity due to excess calories ([place]) 11/27/2015  . Hypothyroidism 04/25/2013  . Controlled type 2 diabetes mellitus ([place]) 07/12/2012  . Carpal tunnel syndrome, bilateral 01/25/2012  . Diabetic polyneuropathy associated with type 2 diabetes mellitus ([place]) 07/04/2011  . Anxiety and depression 06/02/2011  . GERD 12/06/2007  . Hyperlipidemia 08/29/2007  . Hypertension associated with diabetes ([employer]) 08/29/2007    Willow Ora, PTA, [facility] [address], [place], [place] [address] [phone] 06/17/20, 10:50 PM   Name: LADORIS LYTHGOE MRN: [id number] Date of Birth: 1976/02/24

## 2020-06-18 ENCOUNTER — Ambulatory Visit: Payer: Medicare HMO | Admitting: Rehabilitation

## 2020-06-24 ENCOUNTER — Ambulatory Visit: Payer: Medicare HMO | Admitting: Rehabilitation

## 2020-06-26 ENCOUNTER — Ambulatory Visit: Payer: Medicare HMO | Admitting: Physician Assistant

## 2020-06-26 ENCOUNTER — Ambulatory Visit: Payer: Medicare HMO | Admitting: Physical Therapy

## 2020-06-28 DIAGNOSIS — Z89512 Acquired absence of left leg below knee: Secondary | ICD-10-CM | POA: Diagnosis not present

## 2020-06-30 ENCOUNTER — Telehealth: Payer: Self-pay

## 2020-06-30 ENCOUNTER — Other Ambulatory Visit: Payer: Self-pay | Admitting: Internal Medicine

## 2020-06-30 DIAGNOSIS — E785 Hyperlipidemia, unspecified: Secondary | ICD-10-CM

## 2020-06-30 DIAGNOSIS — E1121 Type 2 diabetes mellitus with diabetic nephropathy: Secondary | ICD-10-CM

## 2020-06-30 NOTE — Telephone Encounter (Signed)
Written and will have signed today. Will fax to [facility] and advise to call pt to sch appt. This had been given to pt at last appt.

## 2020-06-30 NOTE — Telephone Encounter (Signed)
Pt called regarding her RX to [facility] and would like to know if it has been sent pt says [facility] states that they haven't received anything from our office.

## 2020-07-01 ENCOUNTER — Ambulatory Visit: Payer: Medicare HMO | Admitting: Rehabilitation

## 2020-07-03 ENCOUNTER — Ambulatory Visit: Payer: Medicare HMO | Admitting: Physical Therapy

## 2020-07-13 ENCOUNTER — Ambulatory Visit: Payer: Medicare HMO | Admitting: Rehabilitation

## 2020-07-15 ENCOUNTER — Ambulatory Visit: Payer: Medicare HMO | Admitting: Rehabilitation

## 2020-07-20 ENCOUNTER — Ambulatory Visit: Payer: Medicare HMO | Admitting: Rehabilitation

## 2020-07-22 ENCOUNTER — Ambulatory Visit: Payer: Medicare HMO | Admitting: Rehabilitation

## 2020-07-23 ENCOUNTER — Other Ambulatory Visit: Payer: Self-pay | Admitting: Student

## 2020-07-23 ENCOUNTER — Other Ambulatory Visit: Payer: Self-pay | Admitting: *Deleted

## 2020-07-23 DIAGNOSIS — G894 Chronic pain syndrome: Secondary | ICD-10-CM

## 2020-07-23 MED ORDER — LISINOPRIL 10 MG PO TABS: 10.0000 mg | ORAL_TABLET | Freq: Every day | ORAL | 2 refills | Status: DC

## 2020-07-23 MED ORDER — OXYCODONE-ACETAMINOPHEN 5-325 MG PO TABS: ORAL_TABLET | ORAL | 0 refills | Status: DC

## 2020-07-23 NOTE — Progress Notes (Signed)
PDMP reviewed, no red flags. Last UDS 02/13/20. Will re-fill Percocet one month rx today. She is due for follow-up appointment, will have her schedule a diabetes f/u appointment within the next month.

## 2020-07-26 DIAGNOSIS — Z89512 Acquired absence of left leg below knee: Secondary | ICD-10-CM | POA: Diagnosis not present

## 2020-07-27 ENCOUNTER — Ambulatory Visit: Payer: Medicare HMO | Admitting: Rehabilitation

## 2020-07-27 DIAGNOSIS — Z89512 Acquired absence of left leg below knee: Secondary | ICD-10-CM | POA: Diagnosis not present

## 2020-07-29 ENCOUNTER — Ambulatory Visit: Payer: Medicare HMO | Admitting: Rehabilitation

## 2020-07-30 ENCOUNTER — Ambulatory Visit: Payer: Medicare HMO | Admitting: Rehabilitation

## 2020-08-03 ENCOUNTER — Ambulatory Visit: Payer: Medicare HMO | Admitting: Rehabilitation

## 2020-08-05 ENCOUNTER — Ambulatory Visit: Payer: Medicare HMO | Admitting: Physical Therapy

## 2020-08-11 ENCOUNTER — Other Ambulatory Visit: Payer: Self-pay | Admitting: Student

## 2020-08-11 ENCOUNTER — Other Ambulatory Visit: Payer: Self-pay | Admitting: Internal Medicine

## 2020-08-11 DIAGNOSIS — E1121 Type 2 diabetes mellitus with diabetic nephropathy: Secondary | ICD-10-CM

## 2020-08-11 DIAGNOSIS — G894 Chronic pain syndrome: Secondary | ICD-10-CM

## 2020-08-11 NOTE — Telephone Encounter (Signed)
Last rx written 07/23/20 Oxycodone. Last OV 1/4/2 with Dr Court Joy. Next OV has not been scheduled. UDS 02/13/20.

## 2020-08-12 ENCOUNTER — Other Ambulatory Visit: Payer: Self-pay | Admitting: Internal Medicine

## 2020-08-12 DIAGNOSIS — G894 Chronic pain syndrome: Secondary | ICD-10-CM

## 2020-08-13 DIAGNOSIS — Z89612 Acquired absence of left leg above knee: Secondary | ICD-10-CM | POA: Diagnosis not present

## 2020-08-24 ENCOUNTER — Ambulatory Visit: Payer: Medicare HMO | Admitting: Rehabilitation

## 2020-08-26 ENCOUNTER — Ambulatory Visit: Payer: Medicare HMO | Attending: Orthopedic Surgery | Admitting: Rehabilitation

## 2020-08-26 ENCOUNTER — Other Ambulatory Visit: Payer: Self-pay

## 2020-08-26 ENCOUNTER — Encounter: Payer: Self-pay | Admitting: Rehabilitation

## 2020-08-26 DIAGNOSIS — M25652 Stiffness of left hip, not elsewhere classified: Secondary | ICD-10-CM | POA: Insufficient documentation

## 2020-08-26 DIAGNOSIS — R293 Abnormal posture: Secondary | ICD-10-CM | POA: Diagnosis not present

## 2020-08-26 DIAGNOSIS — R2689 Other abnormalities of gait and mobility: Secondary | ICD-10-CM | POA: Diagnosis not present

## 2020-08-26 DIAGNOSIS — Z89512 Acquired absence of left leg below knee: Secondary | ICD-10-CM | POA: Diagnosis not present

## 2020-08-26 DIAGNOSIS — R2681 Unsteadiness on feet: Secondary | ICD-10-CM | POA: Insufficient documentation

## 2020-08-26 DIAGNOSIS — R296 Repeated falls: Secondary | ICD-10-CM | POA: Diagnosis not present

## 2020-08-26 NOTE — Therapy (Signed)
[facility] [address] [place], [place], [address] Phone: [phone]   Fax:  [phone]  Physical Therapy Re-Evaluation  Patient Details  Name: [name] [name] MRN: [id number] Date of Birth: [birth year] Referring Provider (PT): Meridee Score, MD   Encounter Date: 08/26/2020   PT End of Session - 08/26/20 1135    Visit Number 1    Number of Visits 17   per updated POC   Date for PT Re-Evaluation 10/25/20   updated POC   Authorization Type [employer] [place] and Medicaid (waiting approval dates)    Authorization Time Period --    Authorization - Visit Number --    Authorization - Number of Visits --    Progress Note Due on Visit 10    PT Start Time 1015    PT Stop Time 1103    PT Time Calculation (min) 48 min    Equipment Utilized During Treatment Gait belt    Activity Tolerance Patient tolerated treatment well;No increased pain;Patient limited by fatigue    Behavior During Therapy [facility] for tasks assessed/performed           Past Medical History:  Diagnosis Date  . Abdominal muscle pain 09/08/2016  . Abnormal Pap smear of cervix 2009  . Abscess    history of multiple abscesses  . Acute bilateral low back pain 02/13/2017  . Acute blood loss anemia   . Acute on chronic renal failure ([place]) 07/12/2012  . Acute renal failure ([place]) 07/12/2012  . Adjustment disorder with depressed mood 03/26/2017  . AKI (acute kidney injury) ([place])   . Anemia of chronic disease 2002  . Anxiety    Panic attacks  . Bilateral lower extremity edema 05/13/2016  . Bipolar disorder ([place])   . Cataract    B/L cataract  . Cellulitis 05/21/2014   right eye  . Chronic bronchitis ([place])    "get it q yr" (05/13/2013)  . Chronic pain   . Dehiscence of amputation stump ([place])   . Depression   . Edema of lower extremity   . Endocarditis 2002   subacute bacterial endocarditis.   . Family history of anesthesia complication    "my mom has a hard time  coming out from under"  . Fibromyalgia   . GERD (gastroesophageal reflux disease)    occ  . Heart murmur   . Herpes simplex type 1 infection 01/16/2018  . History of blood transfusion    "just low blood count" (05/13/2013)  . Hyperlipidemia   . Hypertension   . Hypothyroidism   . Hypothyroidism, adult 03/21/2014  . Hypoxia   . Leukocytosis   . Necrosis ([place])    and ulceration  . Necrotizing fasciitis s/p OR debridements 07/06/2012  . Obesity   . OSA on CPAP    does not wear all the time  . Peripheral neuropathy   . Pneumonia   . Type II diabetes mellitus (HCC)    Type  II  . Wound dehiscence 09/04/2019    Past Surgical History:  Procedure Laterality Date  . ABDOMINAL AORTOGRAM W/LOWER EXTREMITY Left 03/19/2019   Procedure: ABDOMINAL AORTOGRAM W/LOWER EXTREMITY;  Surgeon: Serafina Mitchell, MD;  Location: [facility];  Service: Cardiovascular;  Laterality: Left;  . ABOVE KNEE LEG AMPUTATION Left 10/11/2019   wound dehisence   . AIR/FLUID EXCHANGE Right 12/31/2019   Procedure: AIR/FLUID EXCHANGE;  Surgeon: Jalene Mullet, MD;  Location: [facility];  Service: Ophthalmology;  Laterality: Right;  . AMPUTATION  Left 04/07/2017   Procedure: LEFT BELOW KNEE AMPUTATION;  Surgeon: Newt Minion, MD;  Location: [facility];  Service: Orthopedics;  Laterality: Left;  . AMPUTATION Left 10/11/2019   Procedure: LEFT ABOVE KNEE AMPUTATION;  Surgeon: Newt Minion, MD;  Location: [facility];  Service: Orthopedics;  Laterality: Left;  . CATARACT EXTRACTION W/PHACO Right 02/11/2020   Procedure: CATARACT EXTRACTION PHACO AND INTRAOCULAR LENS PLACEMENT (IOC);  Surgeon: Jalene Mullet, MD;  Location: [facility];  Service: Ophthalmology;  Laterality: Right;  . CORONARY ARTERY BYPASS GRAFT    . EYE SURGERY     lazer  . GAS INSERTION Right 12/31/2019   Procedure: INSERTION OF GAS;  Surgeon: Jalene Mullet, MD;  Location: [facility];  Service: Ophthalmology;  Laterality: Right;  . INCISION AND DRAINAGE ABSCESS      multiple I&Ds  . INCISION AND DRAINAGE ABSCESS Left 07/09/2012   Procedure: DRESSING CHANGE, THIGH WOUND;  Surgeon: Harl Bowie, MD;  Location: [facility];  Service: General;  Laterality: Left;  . INCISION AND DRAINAGE OF WOUND Left 07/07/2012   Procedure: IRRIGATION AND DEBRIDEMENT WOUND;  Surgeon: Harl Bowie, MD;  Location: [facility];  Service: General;  Laterality: Left;  . INCISION AND DRAINAGE PERIRECTAL ABSCESS Left 07/14/2012   Procedure: DEBRIDEMENT OF SKIN & SOFT TISSUE; DRESSING CHANGE UNDER ANESTHESIA;  Surgeon: Gayland Curry, MD,FACS;  Location: [facility];  Service: General;  Laterality: Left;  . INCISION AND DRAINAGE PERIRECTAL ABSCESS Left 07/16/2012   Procedure: I&D Left Thigh;  Surgeon: Gwenyth Ober, MD;  Location: [facility];  Service: General;  Laterality: Left;  . INCISION AND DRAINAGE PERIRECTAL ABSCESS N/A 01/05/2015   Procedure: IRRIGATION AND DEBRIDEMENT PERIRECTAL ABSCESS;  Surgeon: Donnie Mesa, MD;  Location: [facility];  Service: General;  Laterality: N/A;  . IRRIGATION AND DEBRIDEMENT ABSCESS Left 07/06/2012   Procedure: IRRIGATION AND DEBRIDEMENT ABSCESS BUTTOCKS AND THIGH;  Surgeon: Shann Medal, MD;  Location: [facility];  Service: General;  Laterality: Left;  . IRRIGATION AND DEBRIDEMENT ABSCESS Left 08/10/2012   Procedure: IRRIGATION AND DEBRIDEMENT ABSCESS;  Surgeon: Madilyn Hook, DO;  Location: [facility];  Service: General;  Laterality: Left;  . PARS PLANA VITRECTOMY Right 12/31/2019   Procedure: PARS PLANA VITRECTOMY WITH 25 GAUGE;  Surgeon: Jalene Mullet, MD;  Location: [facility];  Service: Ophthalmology;  Laterality: Right;  . PHOTOCOAGULATION WITH LASER Right 12/31/2019   Procedure: PHOTOCOAGULATION WITH LASER;  Surgeon: Jalene Mullet, MD;  Location: [facility];  Service: Ophthalmology;  Laterality: Right;  . REPAIR OF COMPLEX TRACTION RETINAL DETACHMENT Right 12/31/2019   Procedure: REPAIR OF COMPLEX TRACTION RETINAL;  Surgeon: Jalene Mullet, MD;  Location: [facility];  Service:  Ophthalmology;  Laterality: Right;  . STUMP REVISION Left 06/09/2017   Procedure: REVISION LEFT BELOW KNEE AMPUTATION;  Surgeon: Newt Minion, MD;  Location: [facility];  Service: Orthopedics;  Laterality: Left;  . STUMP REVISION Left 09/04/2019   Procedure: LEFT BELOW KNEE AMPUTATION REVISION;  Surgeon: Newt Minion, MD;  Location: [facility];  Service: Orthopedics;  Laterality: Left;  . STUMP REVISION Left 09/20/2019   Procedure: LEFT BELOW KNEE AMPUTATION REVISION;  Surgeon: Newt Minion, MD;  Location: [facility];  Service: Orthopedics;  Laterality: Left;  . TONSILLECTOMY  1994    There were no vitals filed for this visit.    Subjective Assessment - 08/26/20 1131    Subjective Pt returns to clinic following socket revision with prosthetic delivery approx 3 weeks ago.  Now has strap  suspension component which seems to be going very well at this time.  She would like to continue to focus on improving use with prosthesis to be on cane by end of year.    Limitations Walking;House hold activities;Standing    How long can you stand comfortably? 5 mins    How long can you walk comfortably? 5 mins    Patient Stated Goals "I want to be able to walk on this leg and move around"    Currently in Pain? Yes    Pain Score 7     Pain Location Knee    Pain Orientation Right    Pain Descriptors / Indicators Aching;Burning;Sore    Pain Type Chronic pain    Pain Onset More than a month ago    Pain Frequency Constant    Aggravating Factors  standing/walking for longer periods of time    Pain Relieving Factors medication, cold, injections (had one in Jan/Feb)              [facility] PT Assessment - 08/26/20 0001      Assessment   Medical Diagnosis L AKA    Referring Provider (PT) Meridee Score, MD    Onset Date/Surgical Date --   May 2021, socket revision-delivered 3 weeks ago   Hand Dominance Right    Prior Therapy OP neuro PT      Precautions   Precautions Fall    Precaution Comments Microprocessor L knee       Balance Screen   Has the patient fallen in the past 6 months Yes    How many times? 2    Has the patient had a decrease in activity level because of a fear of falling?  Yes    Is the patient reluctant to leave their home because of a fear of falling?  Yes      Home Environment   Living Environment Private residence    Living Arrangements Other relatives    Available Help at Discharge Family;Available 24 hours/day    Type of Home Apartment    Home Access Stairs to enter    Entrance Stairs-Number of Steps 3    Entrance Stairs-Rails None    Home Layout One level    Home Equipment Wheelchair - manual;Walker - 2 wheels;Cane - single point;Shower seat;Bedside commode      Prior Function   Level of Independence Independent with basic ADLs;Independent with transfers    Vocation On disability      Cognition   Overall Cognitive Status Within Functional Limits for tasks assessed      Sensation   Light Touch Impaired Detail    Light Touch Impaired Details Impaired LLE    Hot/Cold Impaired Detail    Hot/Cold Impaired Details Impaired LLE      Coordination   Gross Motor Movements are Fluid and Coordinated Yes    Fine Motor Movements are Fluid and Coordinated Yes      ROM / Strength   AROM / PROM / Strength Strength      AROM   Overall AROM  Other (comment)    Overall AROM Comments Generalized weakness, however grossly WFL during functional tasks.  Continues to have L hip flexor tightness and inability to get full extension during gait.      Transfers   Transfers Sit to Stand;Stand to Sit    Sit to Stand 5: Supervision    Sit to Stand Details Visual cues/gestures for precautions/safety;Visual cues/gestures for sequencing;Verbal cues for sequencing  Stand to Sit 5: Supervision;With upper extremity assist;To chair/3-in-1    Stand to Sit Details (indicate cue type and reason) Verbal cues for sequencing;Verbal cues for technique;Verbal cues for precautions/safety       Ambulation/Gait   Ambulation/Gait Yes    Ambulation/Gait Assistance 5: Supervision    Ambulation/Gait Assistance Details Pt ambulatory with wide RW (she was able to get her own while waiting on her new prosthesis).  Cues for upright posture and keeping hips moving forward (improved hip extension) and when she corrected this, it allowed for improved knee flex.  Still note that L foot drags slightly and is rotated slightly.  Once in room, she removed leg and adjusted so that she was deeper in socket.  Upon ambulating another 34' note marked improvement in alignment however still drags L toe slightly.  Feel that prosthesis may be too long.  She does see Gerald Stabs at [facility] so will discuss then.    Ambulation Distance (Feet) 45 Feet   80' x 2 reps   Assistive device Rolling walker;Prosthesis    Gait Pattern Step-through pattern;Decreased stride length;Decreased stance time - left;Decreased step length - right;Decreased trunk rotation;Trunk flexed;Wide base of support    Ambulation Surface Level;Indoor    Gait velocity .23 ft/sec with RW    Stairs Yes    Stairs Assistance 4: Min assist    Stairs Assistance Details (indicate cue type and reason) Attempted to perform sideways with single L rail however due to narrow step and inability to get feet together, was unable to do this, but was willing to attempt with B rails.  She did well with min A and sat on stool at top to rest with cues for sequencing esp when descending, keeping weight on heel to keep knee in extension.    Stair Management Technique Two rails;Step to pattern;Forwards    Number of Stairs 4    Height of Stairs 6      Standardized Balance Assessment   Standardized Balance Assessment Berg Balance Test      Berg Balance Test   Sit to Stand Able to stand  independently using hands    Standing Unsupported Able to stand safely 2 minutes    Sitting with Back Unsupported but Feet Supported on Floor or Stool Able to sit safely and securely 2  minutes    Stand to Sit Controls descent by using hands    Transfers Able to transfer with verbal cueing and /or supervision    Standing Unsupported with Eyes Closed Able to stand 10 seconds safely    Standing Unsupported with Feet Together Needs help to attain position but able to stand for 30 seconds with feet together   not able to get feet all the way together   From Standing, Reach Forward with Outstretched Arm Can reach forward >12 cm safely (5")    From Standing Position, Pick up Object from Floor Able to pick up shoe, needs supervision    From Standing Position, Turn to Look Behind Over each Shoulder Looks behind from both sides and weight shifts well    Turn 360 Degrees Needs close supervision or verbal cueing    Standing Unsupported, Alternately Place Feet on Step/Stool Needs assistance to keep from falling or unable to try    Standing Unsupported, One Foot in Front Able to take small step independently and hold 30 seconds    Standing on One Leg Unable to try or needs assist to prevent fall    Total Score  34    Berg comment: < 36 high risk for falls (close to 100%)           Prosthetics Assessment - 08/26/20 0001      Prosthetics   Prosthetic Care Independent with Skin check;Proper wear schedule/adjustment    Prosthetic Care Dependent with Care of non-amputated limb;Proper weight-bearing schedule/adjustment    Prosthetic Care Comments  Discussed sweat management as needed, discussed possible length issue with prosthesis, however she sees Geneticist, molecular at [facility] to address.    Donning prosthesis  Supervision   min cues   Doffing prosthesis  Modified independent (Device/Increase time)    Current prosthetic wear tolerance (days/week)  daily     Current prosthetic wear tolerance (#hours/day)  4hrs, 2x/day    Current prosthetic weight-bearing tolerance (hours/day)  5-10 WB    Edema Slight edema, better today than some days per pt report    Residual limb condition  Pt reports no  skin issues    Prosthesis Description velcro strap suspension with microprocessor knee    K code/activity level with prosthetic use  possible K3 community ambulator with possibility of variable cadence.                       Objective measurements completed on examination: See above findings.                 PT Short Term Goals - 08/26/20 1151      PT SHORT TERM GOAL #1   Title Pt will be independent with ongoing standing HEP with prosthesis.   (Target Date: 09/25/20)    Time 4    Period Weeks    Status New    Target Date 09/25/20      PT SHORT TERM GOAL #2   Title Pt will be able to tolerate wearing prosthesis 5 hrs, 2x/day with no skin issues and with good understanding of swelling management.    Baseline wearing 4 hrs, 2x/day    Time 4    Period Weeks    Status New      PT SHORT TERM GOAL #3   Title Pt will  perform dynamic tasks with intermittent UE support x 10 mins at S level and reach to floor level to retrieve item.    Baseline Can stand for 5 mins at a time    Time 4    Period Weeks    Status New      PT SHORT TERM GOAL #4   Title Pt will ambulate x 300' with RW at mod I level without rest break in order to indicate improved activity tolerance.    Time 4    Period Weeks    Status New      PT SHORT TERM GOAL #5   Title Pt will improve gait speed to >/=1.44 ft/sec w/ RW and prosthesis to indicate dec fall risk.    Baseline .38 ft/sec with wide RW    Time 4    Period Weeks    Status New             PT Long Term Goals - 08/26/20 1155      PT LONG TERM GOAL #1   Title Pt will be independent with final HEP in order to indicate improved functional mobility and balance with use of prosthesis.  (Target Date: 10/25/20)    Time 8    Period Weeks    Status New      PT  LONG TERM GOAL #2   Title Pt will be able to tolerate wearing for 90% of awake hours without skin breakdown and with good understanding of swelling mangement.    Baseline  4hrs, 2x/day, still having some swelling    Time 8    Period Weeks    Status New      PT LONG TERM GOAL #3   Title Pt will tolerate standing x 15 mins while performing tasks intermittently without UE support and pick up item from floor with single UE support at mod I level.    Baseline able to stand 5 mins at a time    Time 8    Period Weeks    Status New      PT LONG TERM GOAL #4   Title Pt will improve gait speed to >/=2.00 ft/sec with LRAD and prosthesis to indicate dec fall risk.    Baseline .73 ft/sec with wide RW    Time 8    Period Weeks    Status New      PT LONG TERM GOAL #5   Title Pt will ambulate x 25' w/ SBQC (or LRAD) and prosthesis at min A level in order to indicate improved independence with gait.    Baseline 10' at min A level    Time 8    Period Weeks    Status New      Additional Long Term Goals   Additional Long Term Goals Yes      PT LONG TERM GOAL #6   Title Pt will negotiate up/down 3 steps with single rail and crutch (or LRAD) at S level in order to enter/exit home safely.    Time 8    Period Weeks    Status New                  Plan - 08/26/20 1138    Clinical Impression Statement Pt is familiar to this clinician and returns following socket revision with delivery approx 3 weeks ago.  Since that time, she has built up wear time to 4 hrs, 2x/day but still reports intermittent swelling concerns where she feels she isn't getting fully seated into socket.  Note history of DMII, HTN, HLD, CKD, low back pain and chronic R knee pain.  Her R knee pain is extremely limiting to progress but she has gotten injection for R knee in Jan/Feb this year and reports will get another in May.  She demonstrates decreased overall strength, poor activity tolerance, decreased balance and decreased full use of prosthetic limb.  Pt will benefit from skilled OP PT in order to address deficits.    Personal Factors and Comorbidities Comorbidity 3+;Past/Current  Experience;Time since onset of injury/illness/exacerbation    Comorbidities see above    Examination-Activity Limitations Bend;Carry;Dressing;Stand;Stairs;Squat;Locomotion Level;Transfers    Examination-Participation Restrictions Community Activity;Meal Prep;Shop;Laundry    Stability/Clinical Decision Making Evolving/Moderate complexity    Clinical Decision Making Moderate    Rehab Potential Good    PT Frequency 2x / week    PT Duration 8 weeks    PT Treatment/Interventions ADLs/Self Care Home Management;DME Instruction;Gait training;Stair training;Functional mobility training;Therapeutic activities;Therapeutic exercise;Balance training;Neuromuscular re-education;Patient/family education;Prosthetic Training;Compression bandaging;Passive range of motion;Energy conservation;Iontophoresis 4mg /ml Dexamethasone;Cryotherapy    PT Next Visit Plan Update HEP as needed based on what she is doing at home already,  Work on her activating knee/feeling comfortable being on prosthesis, balance, endurance    Recommended Other Services Did they shorten prosthesis    Consulted and Agree  with Plan of Care Patient           Patient will benefit from skilled therapeutic intervention in order to improve the following deficits and impairments:  Abnormal gait,Decreased activity tolerance,Decreased balance,Decreased endurance,Decreased knowledge of precautions,Decreased knowledge of use of DME,Decreased mobility,Decreased range of motion,Decreased strength,Difficulty walking,Increased edema,Impaired perceived functional ability,Impaired flexibility,Impaired sensation,Postural dysfunction,Improper body mechanics,Prosthetic Dependency,Pain  Visit Diagnosis: Unsteadiness on feet  Abnormal posture  Hip stiffness, left  Other abnormalities of gait and mobility  Repeated falls     Problem List Patient Active Problem List   Diagnosis Date Noted  . Cough 05/26/2020  . Chronic pain syndrome 01/16/2020  . Hx of  AKA (above knee amputation), left ([place])   . Symptomatic anemia 10/21/2019  . Anemia 10/21/2019  . Acute respiratory failure with hypoxia ([place]) 10/21/2019  . Blurry vision, right eye 10/08/2019  . Tobacco use 03/01/2019  . Phantom limb pain ([place]) 11/08/2018  . Current moderate episode of major depressive disorder ([place])   . Hyperkalemia   . Anemia of chronic disease   . Severe protein-calorie malnutrition ([place])   . Fall   . Status post below knee amputation, left ([place]) 04/11/2017  . Bipolar affective disorder ([place])   . Heart failure with preserved ejection fraction ([place]) 02/09/2017  . Routine adult health maintenance 12/08/2016  . Chronic pain of right knee 09/08/2016  . Chronic kidney disease (CKD), stage III (moderate) ([place]) 07/15/2016  . OSA (obstructive sleep apnea) 05/13/2016  . Morbid obesity due to excess calories ([place]) 11/27/2015  . Hypothyroidism 04/25/2013  . Controlled type 2 diabetes mellitus ([place]) 07/12/2012  . Carpal tunnel syndrome, bilateral 01/25/2012  . Diabetic polyneuropathy associated with type 2 diabetes mellitus ([place]) 07/04/2011  . Anxiety and depression 06/02/2011  . GERD 12/06/2007  . Hyperlipidemia 08/29/2007  . Hypertension associated with diabetes ([place]) 08/29/2007    Cameron Sprang, PT, MPT [facility] [address] [place], [place], [address] Phone: [phone]   Fax:  [phone] 08/26/20, 12:01 PM  Name: [name] [name] MRN: [id number] Date of Birth: 04-Apr-1976

## 2020-08-31 ENCOUNTER — Ambulatory Visit: Payer: Medicare HMO | Admitting: Rehabilitation

## 2020-09-02 ENCOUNTER — Ambulatory Visit: Payer: Medicare HMO | Admitting: Rehabilitation

## 2020-09-03 ENCOUNTER — Other Ambulatory Visit: Payer: Self-pay | Admitting: Student

## 2020-09-03 DIAGNOSIS — E1121 Type 2 diabetes mellitus with diabetic nephropathy: Secondary | ICD-10-CM

## 2020-09-03 DIAGNOSIS — G894 Chronic pain syndrome: Secondary | ICD-10-CM

## 2020-09-04 DIAGNOSIS — E113513 Type 2 diabetes mellitus with proliferative diabetic retinopathy with macular edema, bilateral: Secondary | ICD-10-CM | POA: Diagnosis not present

## 2020-09-04 DIAGNOSIS — H3581 Retinal edema: Secondary | ICD-10-CM | POA: Diagnosis not present

## 2020-09-04 DIAGNOSIS — H59811 Chorioretinal scars after surgery for detachment, right eye: Secondary | ICD-10-CM | POA: Diagnosis not present

## 2020-09-04 DIAGNOSIS — H3582 Retinal ischemia: Secondary | ICD-10-CM | POA: Diagnosis not present

## 2020-09-04 DIAGNOSIS — H25812 Combined forms of age-related cataract, left eye: Secondary | ICD-10-CM | POA: Diagnosis not present

## 2020-09-07 ENCOUNTER — Other Ambulatory Visit: Payer: Self-pay

## 2020-09-07 ENCOUNTER — Encounter: Payer: Self-pay | Admitting: Rehabilitation

## 2020-09-07 ENCOUNTER — Ambulatory Visit: Payer: Medicare HMO | Admitting: Rehabilitation

## 2020-09-07 DIAGNOSIS — R2689 Other abnormalities of gait and mobility: Secondary | ICD-10-CM | POA: Diagnosis not present

## 2020-09-07 DIAGNOSIS — M25652 Stiffness of left hip, not elsewhere classified: Secondary | ICD-10-CM | POA: Diagnosis not present

## 2020-09-07 DIAGNOSIS — R293 Abnormal posture: Secondary | ICD-10-CM

## 2020-09-07 DIAGNOSIS — R2681 Unsteadiness on feet: Secondary | ICD-10-CM | POA: Diagnosis not present

## 2020-09-07 DIAGNOSIS — R296 Repeated falls: Secondary | ICD-10-CM | POA: Diagnosis not present

## 2020-09-07 NOTE — Therapy (Signed)
[facility] [address] [place], [place], [address] Phone: [phone]   Fax:  [phone]  Physical Therapy Treatment  Patient Details  Name: [name] [name] MRN: [id number] Date of Birth: Apr 13, 1976 Referring Provider (PT): Meridee Score, MD   Encounter Date: 09/07/2020   PT End of Session - 09/07/20 1242    Visit Number 2    Number of Visits 17   per updated POC   Date for PT Re-Evaluation 10/25/20   updated POC   Authorization Type [employer] [place] and Medicaid (waiting approval dates)    Authorization - Visit Number 2    Progress Note Due on Visit 10    PT Start Time 1019    PT Stop Time 1103    PT Time Calculation (min) 44 min    Equipment Utilized During Treatment Gait belt    Activity Tolerance Patient tolerated treatment well;No increased pain;Patient limited by fatigue    Behavior During Therapy [facility] for tasks assessed/performed           Past Medical History:  Diagnosis Date  . Abdominal muscle pain 09/08/2016  . Abnormal Pap smear of cervix 2009  . Abscess    history of multiple abscesses  . Acute bilateral low back pain 02/13/2017  . Acute blood loss anemia   . Acute on chronic renal failure ([place]) 07/12/2012  . Acute renal failure ([place]) 07/12/2012  . Adjustment disorder with depressed mood 03/26/2017  . AKI (acute kidney injury) ([place])   . Anemia of chronic disease 2002  . Anxiety    Panic attacks  . Bilateral lower extremity edema 05/13/2016  . Bipolar disorder ([place])   . Cataract    B/L cataract  . Cellulitis 05/21/2014   right eye  . Chronic bronchitis ([place])    "get it q yr" (05/13/2013)  . Chronic pain   . Dehiscence of amputation stump ([place])   . Depression   . Edema of lower extremity   . Endocarditis 2002   subacute bacterial endocarditis.   . Family history of anesthesia complication    "my mom has a hard time coming out from under"  . Fibromyalgia   . GERD (gastroesophageal reflux  disease)    occ  . Heart murmur   . Herpes simplex type 1 infection 01/16/2018  . History of blood transfusion    "just low blood count" (05/13/2013)  . Hyperlipidemia   . Hypertension   . Hypothyroidism   . Hypothyroidism, adult 03/21/2014  . Hypoxia   . Leukocytosis   . Necrosis ([place])    and ulceration  . Necrotizing fasciitis s/p OR debridements 07/06/2012  . Obesity   . OSA on CPAP    does not wear all the time  . Peripheral neuropathy   . Pneumonia   . Type II diabetes mellitus (HCC)    Type  II  . Wound dehiscence 09/04/2019    Past Surgical History:  Procedure Laterality Date  . ABDOMINAL AORTOGRAM W/LOWER EXTREMITY Left 03/19/2019   Procedure: ABDOMINAL AORTOGRAM W/LOWER EXTREMITY;  Surgeon: Serafina Mitchell, MD;  Location: [facility];  Service: Cardiovascular;  Laterality: Left;  . ABOVE KNEE LEG AMPUTATION Left 10/11/2019   wound dehisence   . AIR/FLUID EXCHANGE Right 12/31/2019   Procedure: AIR/FLUID EXCHANGE;  Surgeon: Jalene Mullet, MD;  Location: [facility];  Service: Ophthalmology;  Laterality: Right;  . AMPUTATION Left 04/07/2017   Procedure: LEFT BELOW KNEE AMPUTATION;  Surgeon: Newt Minion, MD;  Location: [facility];  Service: Orthopedics;  Laterality: Left;  . AMPUTATION Left 10/11/2019   Procedure: LEFT ABOVE KNEE AMPUTATION;  Surgeon: Newt Minion, MD;  Location: [facility];  Service: Orthopedics;  Laterality: Left;  . CATARACT EXTRACTION W/PHACO Right 02/11/2020   Procedure: CATARACT EXTRACTION PHACO AND INTRAOCULAR LENS PLACEMENT (IOC);  Surgeon: Jalene Mullet, MD;  Location: [facility];  Service: Ophthalmology;  Laterality: Right;  . CORONARY ARTERY BYPASS GRAFT    . EYE SURGERY     lazer  . GAS INSERTION Right 12/31/2019   Procedure: INSERTION OF GAS;  Surgeon: Jalene Mullet, MD;  Location: [facility];  Service: Ophthalmology;  Laterality: Right;  . INCISION AND DRAINAGE ABSCESS     multiple I&Ds  . INCISION AND DRAINAGE ABSCESS Left 07/09/2012   Procedure:  DRESSING CHANGE, THIGH WOUND;  Surgeon: Harl Bowie, MD;  Location: [facility];  Service: General;  Laterality: Left;  . INCISION AND DRAINAGE OF WOUND Left 07/07/2012   Procedure: IRRIGATION AND DEBRIDEMENT WOUND;  Surgeon: Harl Bowie, MD;  Location: [facility];  Service: General;  Laterality: Left;  . INCISION AND DRAINAGE PERIRECTAL ABSCESS Left 07/14/2012   Procedure: DEBRIDEMENT OF SKIN & SOFT TISSUE; DRESSING CHANGE UNDER ANESTHESIA;  Surgeon: Gayland Curry, MD,FACS;  Location: [facility];  Service: General;  Laterality: Left;  . INCISION AND DRAINAGE PERIRECTAL ABSCESS Left 07/16/2012   Procedure: I&D Left Thigh;  Surgeon: Gwenyth Ober, MD;  Location: [facility];  Service: General;  Laterality: Left;  . INCISION AND DRAINAGE PERIRECTAL ABSCESS N/A 01/05/2015   Procedure: IRRIGATION AND DEBRIDEMENT PERIRECTAL ABSCESS;  Surgeon: Donnie Mesa, MD;  Location: [facility];  Service: General;  Laterality: N/A;  . IRRIGATION AND DEBRIDEMENT ABSCESS Left 07/06/2012   Procedure: IRRIGATION AND DEBRIDEMENT ABSCESS BUTTOCKS AND THIGH;  Surgeon: Shann Medal, MD;  Location: [facility];  Service: General;  Laterality: Left;  . IRRIGATION AND DEBRIDEMENT ABSCESS Left 08/10/2012   Procedure: IRRIGATION AND DEBRIDEMENT ABSCESS;  Surgeon: Madilyn Hook, DO;  Location: [facility];  Service: General;  Laterality: Left;  . PARS PLANA VITRECTOMY Right 12/31/2019   Procedure: PARS PLANA VITRECTOMY WITH 25 GAUGE;  Surgeon: Jalene Mullet, MD;  Location: [facility];  Service: Ophthalmology;  Laterality: Right;  . PHOTOCOAGULATION WITH LASER Right 12/31/2019   Procedure: PHOTOCOAGULATION WITH LASER;  Surgeon: Jalene Mullet, MD;  Location: [facility];  Service: Ophthalmology;  Laterality: Right;  . REPAIR OF COMPLEX TRACTION RETINAL DETACHMENT Right 12/31/2019   Procedure: REPAIR OF COMPLEX TRACTION RETINAL;  Surgeon: Jalene Mullet, MD;  Location: [facility];  Service: Ophthalmology;  Laterality: Right;  . STUMP REVISION Left 06/09/2017   Procedure:  REVISION LEFT BELOW KNEE AMPUTATION;  Surgeon: Newt Minion, MD;  Location: [facility];  Service: Orthopedics;  Laterality: Left;  . STUMP REVISION Left 09/04/2019   Procedure: LEFT BELOW KNEE AMPUTATION REVISION;  Surgeon: Newt Minion, MD;  Location: [facility];  Service: Orthopedics;  Laterality: Left;  . STUMP REVISION Left 09/20/2019   Procedure: LEFT BELOW KNEE AMPUTATION REVISION;  Surgeon: Newt Minion, MD;  Location: [facility];  Service: Orthopedics;  Laterality: Left;  . TONSILLECTOMY  1994    There were no vitals filed for this visit.   Subjective Assessment - 09/07/20 1230    Subjective Pt reports she is doing better.  She is wearing the leg more consistently, being more active and taking "fluid pill" allowing her to seat into socket better.    Limitations Walking;House hold activities;Standing  Patient Stated Goals "I want to be able to walk on this leg and move around"    Currently in Pain? Yes    Pain Score 6     Pain Location Knee    Pain Orientation Right    Pain Descriptors / Indicators Aching;Burning;Sore    Pain Type Chronic pain    Pain Onset More than a month ago    Pain Frequency Constant    Aggravating Factors  weather, standing/walking longer periods    Pain Relieving Factors rest, medication                             [facility] Adult PT Treatment/Exercise - 09/07/20 1023      Transfers   Transfers Sit to Stand;Stand to Sit    Sit to Stand 5: Supervision    Sit to Stand Details Visual cues/gestures for precautions/safety;Visual cues/gestures for sequencing;Verbal cues for sequencing    Stand to Sit 5: Supervision;With upper extremity assist;To chair/3-in-1    Stand to Sit Details (indicate cue type and reason) Verbal cues for sequencing;Verbal cues for technique;Verbal cues for precautions/safety      Ambulation/Gait   Ambulation/Gait Yes    Ambulation/Gait Assistance 5: Supervision    Ambulation/Gait Assistance Details Pt ambulatory into clinic  x 100' with RW and prosthesis at S level with continued cues for upright posture and forward gaze to allow for more hip extension through LLE and improved clearance. Clearance is better today as [facility] at [facility] did shorten prosthesis.  Following scitfit, she feels as though leg is loose and did note some rotation while on scifit, therefore ambuated to room and donned 5 ply sock that clinic had as she did not have her own.  Note less rotation and improved gait once back up.  Continued cues as above throughout as well as staying closer to RW throughout.    Ambulation Distance (Feet) 100 Feet   then another 26' x 2   Assistive device Rolling walker;Prosthesis    Gait Pattern Step-through pattern;Decreased stride length;Decreased stance time - left;Decreased step length - right;Decreased trunk rotation;Trunk flexed;Wide base of support    Ambulation Surface Level;Indoor      Neuro Re-ed    Neuro Re-ed Details  In // bars:  PT set up cones on floor in // bars so that when pt ambulating, she must maintain forward motion of L prosthesis and reduce abd/circumduction and improve hip flex.  Pt accurate approx 70% of time with continued cues and tactile facilitation at L hip for improved protraction and weight bearing over prosthesis during stance for improved kne flex and ease of moving into L step.  Breaking down steps of gait cycle with single UE support first advancing RLE forward/back x 10 reps progressing to R step foward, keeping weight on prosthesis for improved knee bend, then step back with RLE, then finally advancing L prosthesis after doing previous steps x 10 reps each.  Progressed to tapping cone with RLE to increase prosthetic WB and L lateral weight shift.  Pt unable to perform with LUE support only but was able to perform with RUE support.  During seated rest breaks had pt sit and perform forward reach at // bars for low back stretch x 2 reps of 30 secs.  Pt also cued to engage core (done with exhale)  when lifting RLE to cone for decreased back pain as well.  Performed cone taps x 20 reps.  Exercises   Exercises Knee/Hip      Knee/Hip Exercises: Aerobic   Stepper Scifit stepper x 7 mins at level 2 with BUEs/LEs with cues to maintain rpms in 50-60's throughout for overall strength and endurance.      Prosthetics   Prosthetic Care Comments  Discussed adding socks and bringing them with her when out of house as she will continue to shrink with increased wear time and WB.  She reports she typically doesn't have this issue, but has been more compliant with "fluid pill" therefore likely has more shrinking of limb through day.  Also discussed more consistently wearing prosthesis 4 hrs, 2x/day as she is is improving but still not consistent.    Current prosthetic wear tolerance (days/week)  daily     Current prosthetic wear tolerance (#hours/day)  4hrs, 2x/day for 2-3 days per week    Current prosthetic weight-bearing tolerance (hours/day)  5-10 WB    Edema improved today    Residual limb condition  Pt reports no skin issues    Education Provided Correct ply sock adjustment;Proper wear schedule/adjustment;Proper weight-bearing schedule/adjustment    Person(s) Educated Patient    Education Method Explanation;Demonstration    Education Method Verbalized understanding;Returned demonstration;Needs further instruction    Donning Prosthesis Modified independent (device/increased time)                    PT Short Term Goals - 08/26/20 1151      PT SHORT TERM GOAL #1   Title Pt will be independent with ongoing standing HEP with prosthesis.   (Target Date: 09/25/20)    Time 4    Period Weeks    Status New    Target Date 09/25/20      PT SHORT TERM GOAL #2   Title Pt will be able to tolerate wearing prosthesis 5 hrs, 2x/day with no skin issues and with good understanding of swelling management.    Baseline wearing 4 hrs, 2x/day    Time 4    Period Weeks    Status New      PT SHORT  TERM GOAL #3   Title Pt will  perform dynamic tasks with intermittent UE support x 10 mins at S level and reach to floor level to retrieve item.    Baseline Can stand for 5 mins at a time    Time 4    Period Weeks    Status New      PT SHORT TERM GOAL #4   Title Pt will ambulate x 300' with RW at mod I level without rest break in order to indicate improved activity tolerance.    Time 4    Period Weeks    Status New      PT SHORT TERM GOAL #5   Title Pt will improve gait speed to >/=1.44 ft/sec w/ RW and prosthesis to indicate dec fall risk.    Baseline .61 ft/sec with wide RW    Time 4    Period Weeks    Status New             PT Long Term Goals - 08/26/20 1155      PT LONG TERM GOAL #1   Title Pt will be independent with final HEP in order to indicate improved functional mobility and balance with use of prosthesis.  (Target Date: 10/25/20)    Time 8    Period Weeks    Status New      PT LONG TERM  GOAL #2   Title Pt will be able to tolerate wearing for 90% of awake hours without skin breakdown and with good understanding of swelling mangement.    Baseline 4hrs, 2x/day, still having some swelling    Time 8    Period Weeks    Status New      PT LONG TERM GOAL #3   Title Pt will tolerate standing x 15 mins while performing tasks intermittently without UE support and pick up item from floor with single UE support at mod I level.    Baseline able to stand 5 mins at a time    Time 8    Period Weeks    Status New      PT LONG TERM GOAL #4   Title Pt will improve gait speed to >/=2.00 ft/sec with LRAD and prosthesis to indicate dec fall risk.    Baseline .1 ft/sec with wide RW    Time 8    Period Weeks    Status New      PT LONG TERM GOAL #5   Title Pt will ambulate x 25' w/ SBQC (or LRAD) and prosthesis at min A level in order to indicate improved independence with gait.    Baseline 10' at min A level    Time 8    Period Weeks    Status New      Additional Long  Term Goals   Additional Long Term Goals Yes      PT LONG TERM GOAL #6   Title Pt will negotiate up/down 3 steps with single rail and crutch (or LRAD) at S level in order to enter/exit home safely.    Time 8    Period Weeks    Status New                 Plan - 09/07/20 1243    Clinical Impression Statement Skilled session focused on BLE strength and endurance with seated scifit which she tolerated well.  Continue to work on improving prosthetic weight bearing and weight shift onto prosthesis during exercise and gait.  Also had to add 5 ply sock during session due to shrinking to better fit into socket.    Personal Factors and Comorbidities Comorbidity 3+;Past/Current Experience;Time since onset of injury/illness/exacerbation    Comorbidities see above    Examination-Activity Limitations Bend;Carry;Dressing;Stand;Stairs;Squat;Locomotion Level;Transfers    Examination-Participation Restrictions Community Activity;Meal Prep;Shop;Laundry    Stability/Clinical Decision Making Evolving/Moderate complexity    Rehab Potential Good    PT Frequency 2x / week    PT Duration 8 weeks    PT Treatment/Interventions ADLs/Self Care Home Management;DME Instruction;Gait training;Stair training;Functional mobility training;Therapeutic activities;Therapeutic exercise;Balance training;Neuromuscular re-education;Patient/family education;Prosthetic Training;Compression bandaging;Passive range of motion;Energy conservation;Iontophoresis 4mg /ml Dexamethasone;Cryotherapy    PT Next Visit Plan Update HEP as needed based on what she is doing at home already,  Work on her activating knee/feeling comfortable being on prosthesis, balance, endurance    Consulted and Agree with Plan of Care Patient           Patient will benefit from skilled therapeutic intervention in order to improve the following deficits and impairments:  Abnormal gait,Decreased activity tolerance,Decreased balance,Decreased endurance,Decreased  knowledge of precautions,Decreased knowledge of use of DME,Decreased mobility,Decreased range of motion,Decreased strength,Difficulty walking,Increased edema,Impaired perceived functional ability,Impaired flexibility,Impaired sensation,Postural dysfunction,Improper body mechanics,Prosthetic Dependency,Pain  Visit Diagnosis: Unsteadiness on feet  Abnormal posture  Hip stiffness, left  Other abnormalities of gait and mobility     Problem List Patient Active Problem List  Diagnosis Date Noted  . Cough 05/26/2020  . Chronic pain syndrome 01/16/2020  . Hx of AKA (above knee amputation), left ([place])   . Symptomatic anemia 10/21/2019  . Anemia 10/21/2019  . Acute respiratory failure with hypoxia ([place]) 10/21/2019  . Blurry vision, right eye 10/08/2019  . Tobacco use 03/01/2019  . Phantom limb pain ([place]) 11/08/2018  . Current moderate episode of major depressive disorder ([place])   . Hyperkalemia   . Anemia of chronic disease   . Severe protein-calorie malnutrition ([place])   . Fall   . Status post below knee amputation, left ([place]) 04/11/2017  . Bipolar affective disorder ([place])   . Heart failure with preserved ejection fraction ([place]) 02/09/2017  . Routine adult health maintenance 12/08/2016  . Chronic pain of right knee 09/08/2016  . Chronic kidney disease (CKD), stage III (moderate) ([place]) 07/15/2016  . OSA (obstructive sleep apnea) 05/13/2016  . Morbid obesity due to excess calories ([place]) 11/27/2015  . Hypothyroidism 04/25/2013  . Controlled type 2 diabetes mellitus ([place]) 07/12/2012  . Carpal tunnel syndrome, bilateral 01/25/2012  . Diabetic polyneuropathy associated with type 2 diabetes mellitus ([place]) 07/04/2011  . Anxiety and depression 06/02/2011  . GERD 12/06/2007  . Hyperlipidemia 08/29/2007  . Hypertension associated with diabetes ([place]) 08/29/2007    Cameron Sprang, PT, MPT [facility] [address] [place], [place],  [address] Phone: [phone]   Fax:  [phone] 09/07/20, 12:45 PM  Name: [name] [name] MRN: [id number] Date of Birth: 1976/04/13

## 2020-09-09 ENCOUNTER — Encounter: Payer: Self-pay | Admitting: Rehabilitation

## 2020-09-09 ENCOUNTER — Other Ambulatory Visit: Payer: Self-pay

## 2020-09-09 ENCOUNTER — Ambulatory Visit: Payer: Medicare HMO | Admitting: Rehabilitation

## 2020-09-09 DIAGNOSIS — R293 Abnormal posture: Secondary | ICD-10-CM | POA: Diagnosis not present

## 2020-09-09 DIAGNOSIS — R2681 Unsteadiness on feet: Secondary | ICD-10-CM

## 2020-09-09 DIAGNOSIS — R2689 Other abnormalities of gait and mobility: Secondary | ICD-10-CM | POA: Diagnosis not present

## 2020-09-09 DIAGNOSIS — R296 Repeated falls: Secondary | ICD-10-CM | POA: Diagnosis not present

## 2020-09-09 DIAGNOSIS — M25652 Stiffness of left hip, not elsewhere classified: Secondary | ICD-10-CM | POA: Diagnosis not present

## 2020-09-09 NOTE — Therapy (Signed)
[facility] [address] [place], [place], [address] Phone: [phone]   Fax:  [phone]  Physical Therapy Treatment  Patient Details  Name: [name] [name] MRN: [id number] Date of Birth: Aug 24, 1975 Referring Provider (PT): Meridee Score, MD   Encounter Date: 09/09/2020   PT End of Session - 09/09/20 1227    Visit Number 3    Number of Visits 17   per updated POC   Date for PT Re-Evaluation 10/25/20   updated POC   Authorization Type [employer] [place] and Medicaid (waiting approval dates)    Authorization - Visit Number 3    Progress Note Due on Visit 10    PT Start Time 1010    PT Stop Time 1100    PT Time Calculation (min) 50 min    Equipment Utilized During Treatment Gait belt    Activity Tolerance Patient tolerated treatment well;No increased pain;Patient limited by fatigue    Behavior During Therapy [facility] for tasks assessed/performed           Past Medical History:  Diagnosis Date  . Abdominal muscle pain 09/08/2016  . Abnormal Pap smear of cervix 2009  . Abscess    history of multiple abscesses  . Acute bilateral low back pain 02/13/2017  . Acute blood loss anemia   . Acute on chronic renal failure ([place]) 07/12/2012  . Acute renal failure ([place]) 07/12/2012  . Adjustment disorder with depressed mood 03/26/2017  . AKI (acute kidney injury) ([place])   . Anemia of chronic disease 2002  . Anxiety    Panic attacks  . Bilateral lower extremity edema 05/13/2016  . Bipolar disorder ([place])   . Cataract    B/L cataract  . Cellulitis 05/21/2014   right eye  . Chronic bronchitis ([place])    "get it q yr" (05/13/2013)  . Chronic pain   . Dehiscence of amputation stump ([place])   . Depression   . Edema of lower extremity   . Endocarditis 2002   subacute bacterial endocarditis.   . Family history of anesthesia complication    "my mom has a hard time coming out from under"  . Fibromyalgia   . GERD (gastroesophageal reflux  disease)    occ  . Heart murmur   . Herpes simplex type 1 infection 01/16/2018  . History of blood transfusion    "just low blood count" (05/13/2013)  . Hyperlipidemia   . Hypertension   . Hypothyroidism   . Hypothyroidism, adult 03/21/2014  . Hypoxia   . Leukocytosis   . Necrosis ([place])    and ulceration  . Necrotizing fasciitis s/p OR debridements 07/06/2012  . Obesity   . OSA on CPAP    does not wear all the time  . Peripheral neuropathy   . Pneumonia   . Type II diabetes mellitus (HCC)    Type  II  . Wound dehiscence 09/04/2019    Past Surgical History:  Procedure Laterality Date  . ABDOMINAL AORTOGRAM W/LOWER EXTREMITY Left 03/19/2019   Procedure: ABDOMINAL AORTOGRAM W/LOWER EXTREMITY;  Surgeon: Serafina Mitchell, MD;  Location: [place] [facility];  Service: Cardiovascular;  Laterality: Left;  . ABOVE KNEE LEG AMPUTATION Left 10/11/2019   wound dehisence   . AIR/FLUID EXCHANGE Right 12/31/2019   Procedure: AIR/FLUID EXCHANGE;  Surgeon: Jalene Mullet, MD;  Location: [place];  Service: Ophthalmology;  Laterality: Right;  . AMPUTATION Left 04/07/2017   Procedure: LEFT BELOW KNEE AMPUTATION;  Surgeon: Newt Minion, MD;  Location: [place];  Service: Orthopedics;  Laterality: Left;  . AMPUTATION Left 10/11/2019   Procedure: LEFT ABOVE KNEE AMPUTATION;  Surgeon: Newt Minion, MD;  Location: [place];  Service: Orthopedics;  Laterality: Left;  . CATARACT EXTRACTION W/PHACO Right 02/11/2020   Procedure: CATARACT EXTRACTION PHACO AND INTRAOCULAR LENS PLACEMENT (IOC);  Surgeon: Jalene Mullet, MD;  Location: [place];  Service: Ophthalmology;  Laterality: Right;  . CORONARY ARTERY BYPASS GRAFT    . EYE SURGERY     lazer  . GAS INSERTION Right 12/31/2019   Procedure: INSERTION OF GAS;  Surgeon: Jalene Mullet, MD;  Location: [place];  Service: Ophthalmology;  Laterality: Right;  . INCISION AND DRAINAGE ABSCESS     multiple I&Ds  . INCISION AND DRAINAGE ABSCESS Left 07/09/2012   Procedure:  DRESSING CHANGE, THIGH WOUND;  Surgeon: Harl Bowie, MD;  Location: [place];  Service: General;  Laterality: Left;  . INCISION AND DRAINAGE OF WOUND Left 07/07/2012   Procedure: IRRIGATION AND DEBRIDEMENT WOUND;  Surgeon: Harl Bowie, MD;  Location: [place];  Service: General;  Laterality: Left;  . INCISION AND DRAINAGE PERIRECTAL ABSCESS Left 07/14/2012   Procedure: DEBRIDEMENT OF SKIN & SOFT TISSUE; DRESSING CHANGE UNDER ANESTHESIA;  Surgeon: Gayland Curry, MD,FACS;  Location: [place];  Service: General;  Laterality: Left;  . INCISION AND DRAINAGE PERIRECTAL ABSCESS Left 07/16/2012   Procedure: I&D Left Thigh;  Surgeon: Gwenyth Ober, MD;  Location: [place];  Service: General;  Laterality: Left;  . INCISION AND DRAINAGE PERIRECTAL ABSCESS N/A 01/05/2015   Procedure: IRRIGATION AND DEBRIDEMENT PERIRECTAL ABSCESS;  Surgeon: Donnie Mesa, MD;  Location: [place];  Service: General;  Laterality: N/A;  . IRRIGATION AND DEBRIDEMENT ABSCESS Left 07/06/2012   Procedure: IRRIGATION AND DEBRIDEMENT ABSCESS BUTTOCKS AND THIGH;  Surgeon: Shann Medal, MD;  Location: [place];  Service: General;  Laterality: Left;  . IRRIGATION AND DEBRIDEMENT ABSCESS Left 08/10/2012   Procedure: IRRIGATION AND DEBRIDEMENT ABSCESS;  Surgeon: Madilyn Hook, DO;  Location: [place];  Service: General;  Laterality: Left;  . PARS PLANA VITRECTOMY Right 12/31/2019   Procedure: PARS PLANA VITRECTOMY WITH 25 GAUGE;  Surgeon: Jalene Mullet, MD;  Location: [place];  Service: Ophthalmology;  Laterality: Right;  . PHOTOCOAGULATION WITH LASER Right 12/31/2019   Procedure: PHOTOCOAGULATION WITH LASER;  Surgeon: Jalene Mullet, MD;  Location: [facility];  Service: Ophthalmology;  Laterality: Right;  . REPAIR OF COMPLEX TRACTION RETINAL DETACHMENT Right 12/31/2019   Procedure: REPAIR OF COMPLEX TRACTION RETINAL;  Surgeon: Jalene Mullet, MD;  Location: [place];  Service: Ophthalmology;  Laterality: Right;  . STUMP REVISION Left 06/09/2017   Procedure:  REVISION LEFT BELOW KNEE AMPUTATION;  Surgeon: Newt Minion, MD;  Location: [place];  Service: Orthopedics;  Laterality: Left;  . STUMP REVISION Left 09/04/2019   Procedure: LEFT BELOW KNEE AMPUTATION REVISION;  Surgeon: Newt Minion, MD;  Location: [place];  Service: Orthopedics;  Laterality: Left;  . STUMP REVISION Left 09/20/2019   Procedure: LEFT BELOW KNEE AMPUTATION REVISION;  Surgeon: Newt Minion, MD;  Location: [place];  Service: Orthopedics;  Laterality: Left;  . TONSILLECTOMY  1994    There were no vitals filed for this visit.   Subjective Assessment - 09/09/20 1018    Subjective Reports doing better.  She was able to start with 5ply sock today when getting leg on.    Limitations Walking;House hold activities;Standing    Patient Stated Goals "I want to be able  to walk on this leg and move around"    Currently in Pain? Yes    Pain Score 7                              [facility] Adult PT Treatment/Exercise - 09/09/20 1021      Transfers   Transfers Sit to Stand;Stand to Sit    Sit to Stand 5: Supervision    Sit to Stand Details Visual cues/gestures for precautions/safety;Visual cues/gestures for sequencing;Verbal cues for sequencing    Stand to Sit 5: Supervision;With upper extremity assist;To chair/3-in-1    Stand to Sit Details (indicate cue type and reason) Verbal cues for sequencing;Verbal cues for technique;Verbal cues for precautions/safety      Ambulation/Gait   Ambulation/Gait Yes    Ambulation/Gait Assistance 5: Supervision    Ambulation/Gait Assistance Details Pt ambulatory into clinic with RW/prosthesis and reporting she has 5ply sock donned already this morning and attributes this to continuing to take "fluid pill."  Discussed that if she continues to have to apply more and more socks due to shrinking she will need to let Gerald Stabs at [facility] know so that he may add padding to socket if needed.  Cues for upright posture throughout and staying close to RW to  allow more hip extension esp on L side to allow improved L knee bend at end of stance phase.  Note that she does still have some rotation at beginning and end of session which partly was due to needing more socks at beginning of session but also she tends to IR at hip when clearing prosthesis.  Following scifit, had her don another 3 ply sock (this is all she had with her) and pt still able to get seated fully into socket.  Continued same cues throughout gait with RW.    Ambulation Distance (Feet) 100 Feet   x2 reps, and another 32'   Assistive device Rolling walker;Prosthesis    Gait Pattern Step-through pattern;Decreased stride length;Decreased stance time - left;Decreased step length - right;Decreased trunk rotation;Trunk flexed;Wide base of support    Ambulation Surface Level;Indoor      Dynamic Standing Balance   Dynamic Standing - Level of Assistance 5: Stand by assistance    Dynamic Standing - Balance Activities Lateral lean/weight shifting;Forward lean/weight shifting;Ball toss    Lateral lean/weight shifting comments: Performed lateral weight shifting in order to perform R LE forward/retrostepping over yard stick x 10 reps with BUE>single UE support.    Diona Foley toss comments: Performed ball toss with feet side by side x 10 reps (2 sets) then performed with feet staggered (R foot slightly in front of L) to perform ball toss x 10 reps progressing to stepping RLE forward tapping ball and stepping back with single UE support x 10 reps.  Pt continues to have back pain and needing intermittent standing rest break to stretch low back/hamstrings.      Neuro Re-ed    Neuro Re-ed Details  Also performed unsupported standing UE tasks with green theraband:  scapular retraction x 10 reps and chest press x 10 reps.  Cues throughout all standing tasks to improve core/TA activation with exhale first.      Knee/Hip Exercises: Aerobic   Stepper Scifit stepper x 9 mins at level 2.5 with BUEs/LEs with cues to  maintain rpms in 50-60's throughout for overall strength and endurance.      Prosthetics   Prosthetic Care Comments  Continue to  discuss sock ply adjustment throughout the day with continued shrinking and activity.    Current prosthetic wear tolerance (days/week)  daily     Current prosthetic wear tolerance (#hours/day)  4hrs, 2x/day for 2-3 days per week    Current prosthetic weight-bearing tolerance (hours/day)  10 mins    Edema improved today    Residual limb condition  Pt reports no skin issues    Education Provided Correct ply sock adjustment;Proper wear schedule/adjustment;Proper weight-bearing schedule/adjustment    Person(s) Educated Patient    Education Method Explanation;Verbal cues    Education Method Verbalized understanding;Needs further instruction    Donning Prosthesis Modified independent (device/increased time)                    PT Short Term Goals - 08/26/20 1151      PT SHORT TERM GOAL #1   Title Pt will be independent with ongoing standing HEP with prosthesis.   (Target Date: 09/25/20)    Time 4    Period Weeks    Status New    Target Date 09/25/20      PT SHORT TERM GOAL #2   Title Pt will be able to tolerate wearing prosthesis 5 hrs, 2x/day with no skin issues and with good understanding of swelling management.    Baseline wearing 4 hrs, 2x/day    Time 4    Period Weeks    Status New      PT SHORT TERM GOAL #3   Title Pt will  perform dynamic tasks with intermittent UE support x 10 mins at S level and reach to floor level to retrieve item.    Baseline Can stand for 5 mins at a time    Time 4    Period Weeks    Status New      PT SHORT TERM GOAL #4   Title Pt will ambulate x 300' with RW at mod I level without rest break in order to indicate improved activity tolerance.    Time 4    Period Weeks    Status New      PT SHORT TERM GOAL #5   Title Pt will improve gait speed to >/=1.44 ft/sec w/ RW and prosthesis to indicate dec fall risk.     Baseline .78 ft/sec with wide RW    Time 4    Period Weeks    Status New             PT Long Term Goals - 08/26/20 1155      PT LONG TERM GOAL #1   Title Pt will be independent with final HEP in order to indicate improved functional mobility and balance with use of prosthesis.  (Target Date: 10/25/20)    Time 8    Period Weeks    Status New      PT LONG TERM GOAL #2   Title Pt will be able to tolerate wearing for 90% of awake hours without skin breakdown and with good understanding of swelling mangement.    Baseline 4hrs, 2x/day, still having some swelling    Time 8    Period Weeks    Status New      PT LONG TERM GOAL #3   Title Pt will tolerate standing x 15 mins while performing tasks intermittently without UE support and pick up item from floor with single UE support at mod I level.    Baseline able to stand 5 mins at a time  Time 8    Period Weeks    Status New      PT LONG TERM GOAL #4   Title Pt will improve gait speed to >/=2.00 ft/sec with LRAD and prosthesis to indicate dec fall risk.    Baseline .67 ft/sec with wide RW    Time 8    Period Weeks    Status New      PT LONG TERM GOAL #5   Title Pt will ambulate x 25' w/ SBQC (or LRAD) and prosthesis at min A level in order to indicate improved independence with gait.    Baseline 10' at min A level    Time 8    Period Weeks    Status New      Additional Long Term Goals   Additional Long Term Goals Yes      PT LONG TERM GOAL #6   Title Pt will negotiate up/down 3 steps with single rail and crutch (or LRAD) at S level in order to enter/exit home safely.    Time 8    Period Weeks    Status New                 Plan - 09/09/20 1228    Clinical Impression Statement Skilled session continues to focus on strengthening and endurance along with improving prosthetic weight bearing and shifting onto prosthesis.  Pt able to tolerate increased standing times today but does need "stretch breaks" due to low back  pain.  PT did discuss possible aquatic PT with and how this may help her build strength and endurance while not aggravating R knee pain.  Pt would like to pursue if possible. Therefore will discuss with aquatic PT and schedule as able.    Personal Factors and Comorbidities Comorbidity 3+;Past/Current Experience;Time since onset of injury/illness/exacerbation    Comorbidities see above    Examination-Activity Limitations Bend;Carry;Dressing;Stand;Stairs;Squat;Locomotion Level;Transfers    Examination-Participation Restrictions Community Activity;Meal Prep;Shop;Laundry    Stability/Clinical Decision Making Evolving/Moderate complexity    Rehab Potential Good    PT Frequency 2x / week    PT Duration 8 weeks    PT Treatment/Interventions ADLs/Self Care Home Management;DME Instruction;Gait training;Stair training;Functional mobility training;Therapeutic activities;Therapeutic exercise;Balance training;Neuromuscular re-education;Patient/family education;Prosthetic Training;Compression bandaging;Passive range of motion;Energy conservation;Iontophoresis 4mg /ml Dexamethasone;Cryotherapy;Aquatic Therapy    PT Next Visit Plan Update HEP as needed based on what she is doing at home already,  Work on her activating knee/feeling comfortable being on prosthesis, balance, endurance    Consulted and Agree with Plan of Care Patient           Patient will benefit from skilled therapeutic intervention in order to improve the following deficits and impairments:  Abnormal gait,Decreased activity tolerance,Decreased balance,Decreased endurance,Decreased knowledge of precautions,Decreased knowledge of use of DME,Decreased mobility,Decreased range of motion,Decreased strength,Difficulty walking,Increased edema,Impaired perceived functional ability,Impaired flexibility,Impaired sensation,Postural dysfunction,Improper body mechanics,Prosthetic Dependency,Pain  Visit Diagnosis: Unsteadiness on feet  Abnormal  posture  Hip stiffness, left  Other abnormalities of gait and mobility     Problem List Patient Active Problem List   Diagnosis Date Noted  . Cough 05/26/2020  . Chronic pain syndrome 01/16/2020  . Hx of AKA (above knee amputation), left ([place])   . Symptomatic anemia 10/21/2019  . Anemia 10/21/2019  . Acute respiratory failure with hypoxia ([place]) 10/21/2019  . Blurry vision, right eye 10/08/2019  . Tobacco use 03/01/2019  . Phantom limb pain ([place]) 11/08/2018  . Current moderate episode of major depressive disorder ([place])   . Hyperkalemia   .  Anemia of chronic disease   . Severe protein-calorie malnutrition ([place])   . Fall   . Status post below knee amputation, left ([place]) 04/11/2017  . Bipolar affective disorder ([place])   . Heart failure with preserved ejection fraction ([place]) 02/09/2017  . Routine adult health maintenance 12/08/2016  . Chronic pain of right knee 09/08/2016  . Chronic kidney disease (CKD), stage III (moderate) ([place]) 07/15/2016  . OSA (obstructive sleep apnea) 05/13/2016  . Morbid obesity due to excess calories ([place]) 11/27/2015  . Hypothyroidism 04/25/2013  . Controlled type 2 diabetes mellitus ([place]) 07/12/2012  . Carpal tunnel syndrome, bilateral 01/25/2012  . Diabetic polyneuropathy associated with type 2 diabetes mellitus ([place]) 07/04/2011  . Anxiety and depression 06/02/2011  . GERD 12/06/2007  . Hyperlipidemia 08/29/2007  . Hypertension associated with diabetes ([place]) 08/29/2007    Cameron Sprang, PT, MPT [facility] [address] [place], [place], [address] Phone: [phone]   Fax:  [phone] 09/09/20, 12:35 PM  Name: [name] [name] MRN: [id number] Date of Birth: [birth year]

## 2020-09-10 ENCOUNTER — Other Ambulatory Visit (HOSPITAL_COMMUNITY)
Admission: RE | Admit: 2020-09-10 | Discharge: 2020-09-10 | Disposition: A | Discharge status: Home / Self Care | Payer: Medicare HMO | Source: Ambulatory Visit | Attending: Student in an Organized Health Care Education/Training Program | Admitting: Student in an Organized Health Care Education/Training Program

## 2020-09-10 ENCOUNTER — Other Ambulatory Visit: Payer: Self-pay

## 2020-09-10 ENCOUNTER — Ambulatory Visit (INDEPENDENT_AMBULATORY_CARE_PROVIDER_SITE_OTHER): Payer: Medicare HMO | Admitting: Internal Medicine

## 2020-09-10 ENCOUNTER — Encounter: Payer: Self-pay | Admitting: Internal Medicine

## 2020-09-10 ENCOUNTER — Other Ambulatory Visit: Payer: Self-pay | Admitting: Student

## 2020-09-10 VITALS — BP 182/94 | HR 85 | Temp 98.0°F | Ht 69.0 in | Wt 291.1 lb

## 2020-09-10 DIAGNOSIS — Z113 Encounter for screening for infections with a predominantly sexual mode of transmission: Secondary | ICD-10-CM | POA: Insufficient documentation

## 2020-09-10 DIAGNOSIS — Z124 Encounter for screening for malignant neoplasm of cervix: Secondary | ICD-10-CM | POA: Diagnosis not present

## 2020-09-10 DIAGNOSIS — E1122 Type 2 diabetes mellitus with diabetic chronic kidney disease: Secondary | ICD-10-CM

## 2020-09-10 DIAGNOSIS — F419 Anxiety disorder, unspecified: Secondary | ICD-10-CM

## 2020-09-10 DIAGNOSIS — N898 Other specified noninflammatory disorders of vagina: Secondary | ICD-10-CM

## 2020-09-10 DIAGNOSIS — Z1151 Encounter for screening for human papillomavirus (HPV): Secondary | ICD-10-CM | POA: Diagnosis not present

## 2020-09-10 DIAGNOSIS — D638 Anemia in other chronic diseases classified elsewhere: Secondary | ICD-10-CM | POA: Diagnosis not present

## 2020-09-10 DIAGNOSIS — I129 Hypertensive chronic kidney disease with stage 1 through stage 4 chronic kidney disease, or unspecified chronic kidney disease: Secondary | ICD-10-CM | POA: Diagnosis not present

## 2020-09-10 DIAGNOSIS — E038 Other specified hypothyroidism: Secondary | ICD-10-CM | POA: Diagnosis not present

## 2020-09-10 DIAGNOSIS — E1159 Type 2 diabetes mellitus with other circulatory complications: Secondary | ICD-10-CM | POA: Diagnosis not present

## 2020-09-10 DIAGNOSIS — Z7984 Long term (current) use of oral hypoglycemic drugs: Secondary | ICD-10-CM

## 2020-09-10 DIAGNOSIS — N760 Acute vaginitis: Secondary | ICD-10-CM

## 2020-09-10 DIAGNOSIS — G894 Chronic pain syndrome: Secondary | ICD-10-CM

## 2020-09-10 DIAGNOSIS — E039 Hypothyroidism, unspecified: Secondary | ICD-10-CM

## 2020-09-10 DIAGNOSIS — F32A Depression, unspecified: Secondary | ICD-10-CM | POA: Diagnosis not present

## 2020-09-10 DIAGNOSIS — N1832 Chronic kidney disease, stage 3b: Secondary | ICD-10-CM

## 2020-09-10 LAB — POCT GLYCOSYLATED HEMOGLOBIN (HGB A1C): Hemoglobin A1C: 6 % — AB (ref 4.0–5.6)

## 2020-09-10 LAB — GLUCOSE, CAPILLARY: Glucose-Capillary: 83 mg/dL (ref 70–99)

## 2020-09-10 MED ORDER — BUPROPION HCL ER (XL) 150 MG PO TB24: 150.0000 mg | ORAL_TABLET | ORAL | 3 refills | Status: DC

## 2020-09-10 MED ORDER — HYDRALAZINE HCL 50 MG PO TABS: 50.0000 mg | ORAL_TABLET | Freq: Three times a day (TID) | ORAL | 0 refills | Status: DC

## 2020-09-10 NOTE — Progress Notes (Deleted)
   CC: rash  HPI:  [name] is a 45 y.o.   Past Medical History:  Diagnosis Date  . Abdominal muscle pain 09/08/2016  . Abnormal Pap smear of cervix 2009  . Abscess    history of multiple abscesses  . Acute bilateral low back pain 02/13/2017  . Acute blood loss anemia   . Acute on chronic renal failure ([place]) 07/12/2012  . Acute renal failure ([place]) 07/12/2012  . Adjustment disorder with depressed mood 03/26/2017  . AKI (acute kidney injury) ([place])   . Anemia of chronic disease 2002  . Anxiety    Panic attacks  . Bilateral lower extremity edema 05/13/2016  . Bipolar disorder ([place])   . Cataract    B/L cataract  . Cellulitis 05/21/2014   right eye  . Chronic bronchitis ([place])    "get it q yr" (05/13/2013)  . Chronic pain   . Dehiscence of amputation stump ([place])   . Depression   . Edema of lower extremity   . Endocarditis 2002   subacute bacterial endocarditis.   . Family history of anesthesia complication    "my mom has a hard time coming out from under"  . Fibromyalgia   . GERD (gastroesophageal reflux disease)    occ  . Heart murmur   . Herpes simplex type 1 infection 01/16/2018  . History of blood transfusion    "just low blood count" (05/13/2013)  . Hyperlipidemia   . Hypertension   . Hypothyroidism   . Hypothyroidism, adult 03/21/2014  . Hypoxia   . Leukocytosis   . Necrosis ([place])    and ulceration  . Necrotizing fasciitis s/p OR debridements 07/06/2012  . Obesity   . OSA on CPAP    does not wear all the time  . Peripheral neuropathy   . Pneumonia   . Type II diabetes mellitus (HCC)    Type  II  . Wound dehiscence 09/04/2019   Review of Systems:  Negative except as stated in HPI.  Physical Exam:  Vitals:   09/10/20 1040 09/10/20 1043  BP: (!) 201/98 (!) 185/94  Pulse: 92 85  Temp: 98 F (36.7 C)   TempSrc: Oral   SpO2: 100%   Weight: 291 lb 1.6 oz (132 kg)   Height: 5\' 9"  (1.753 m)    Physical Exam  Constitutional: Appears  well-developed and well-nourished. No distress.  HENT: Normocephalic and atraumatic, EOMI, conjunctiva normal, moist mucous membranes Cardiovascular: Normal rate, regular rhythm, S1 and S2 present, no murmurs, rubs, gallops.  Distal pulses intact Respiratory: No respiratory distress, no accessory muscle use.  Effort is normal.  Lungs are clear to auscultation bilaterally. GI: Nondistended, soft, nontender to palpation, normal active bowel sounds Musculoskeletal: Normal bulk and tone.  No peripheral edema noted. Neurological: Is alert and oriented x4, no apparent focal deficits noted. Skin: Warm and dry.  No rash, erythema, lesions noted. Psychiatric: Normal mood and affect. Behavior is normal. Judgment and thought content normal.    Assessment & Plan:   See Encounters Tab for problem based charting.  Patient discussed with Dr. {NAMES:[id number]::"Butcher","Guilloud","Hoffman","Mullen","Narendra","Raines","Vincent"}

## 2020-09-10 NOTE — Patient Instructions (Addendum)
Ms [name] [name],  It was a pleasure seeing you in clinic. Today we discussed:   Vaginal irritation: I am checking your urine and also sent for further testing for any infectious cause. I will call you with any abnormal results and recommended treatment  Fatigue/Weight gain: I am checking on your iron and thyroid levels and will give you a call with any abnormal results.   Diabetes: I am checking on your diabetes and kidney function today. I am also checking your urine at this visit. I will give you a call with any abnormal results.   Blood pressure: At this time, please increase your hydralazine to 50mg  three times daily. Follow up in 4 weeks for BP check.   If you have any questions or concerns, please call our clinic at [phone] between 9am-5pm and after hours call [phone] and ask for the internal medicine resident on call. If you feel you are having a medical emergency please call 911.   Thank you, we look forward to helping you remain healthy!  If you have not gotten the COVID vaccine, I recommend doing so:  You may get it at your local [facility] or [facility] OR To schedule an appointment for a COVID vaccine or be added to the vaccine wait list: Go to [email or web address]   OR Go to [email or web address]                  OR Call [phone]                                     OR Call [phone] and select Option 2

## 2020-09-10 NOTE — Telephone Encounter (Signed)
Last rx written 08/14/20. Last OV today UDS 02/13/20.

## 2020-09-11 LAB — MICROSCOPIC EXAMINATION
Bacteria, UA: NONE SEEN
Casts: NONE SEEN /lpf
RBC, Urine: NONE SEEN /hpf (ref 0–2)

## 2020-09-11 LAB — CERVICOVAGINAL ANCILLARY ONLY
Bacterial Vaginitis (gardnerella): POSITIVE — AB
Candida Glabrata: NEGATIVE
Candida Vaginitis: NEGATIVE
Chlamydia: NEGATIVE
Comment: NEGATIVE
Comment: NEGATIVE
Comment: NEGATIVE
Comment: NEGATIVE
Comment: NEGATIVE
Comment: NORMAL
Neisseria Gonorrhea: NEGATIVE
Trichomonas: NEGATIVE

## 2020-09-11 LAB — BMP8+ANION GAP
Anion Gap: 19 mmol/L — ABNORMAL HIGH (ref 10.0–18.0)
BUN/Creatinine Ratio: 13 (ref 9–23)
BUN: 67 mg/dL — ABNORMAL HIGH (ref 6–24)
CO2: 15 mmol/L — ABNORMAL LOW (ref 20–29)
Calcium: 8.5 mg/dL — ABNORMAL LOW (ref 8.7–10.2)
Chloride: 108 mmol/L — ABNORMAL HIGH (ref 96–106)
Creatinine, Ser: 5.02 mg/dL — ABNORMAL HIGH (ref 0.57–1.00)
Glucose: 81 mg/dL (ref 65–99)
Potassium: 5.1 mmol/L (ref 3.5–5.2)
Sodium: 142 mmol/L (ref 134–144)
eGFR: 10 mL/min/{1.73_m2} — ABNORMAL LOW (ref >59)

## 2020-09-11 LAB — LIPID PANEL
Chol/HDL Ratio: 3.3 ratio (ref 0.0–4.4)
Cholesterol, Total: 187 mg/dL (ref 100–199)
HDL: 56 mg/dL (ref >39)
LDL Chol Calc (NIH): 117 mg/dL — ABNORMAL HIGH (ref 0–99)
Triglycerides: 77 mg/dL (ref 0–149)
VLDL Cholesterol Cal: 14 mg/dL (ref 5–40)

## 2020-09-11 LAB — IRON,TIBC AND FERRITIN PANEL
Ferritin: 135 ng/mL (ref 15–150)
Iron Saturation: 25 % (ref 15–55)
Iron: 56 ug/dL (ref 27–159)
Total Iron Binding Capacity: 220 ug/dL — ABNORMAL LOW (ref 250–450)
UIBC: 164 ug/dL (ref 131–425)

## 2020-09-11 LAB — CBC
Hematocrit: 32.2 % — ABNORMAL LOW (ref 34.0–46.6)
Hemoglobin: 10.3 g/dL — ABNORMAL LOW (ref 11.1–15.9)
MCH: 26.2 pg — ABNORMAL LOW (ref 26.6–33.0)
MCHC: 32 g/dL (ref 31.5–35.7)
MCV: 82 fL (ref 79–97)
Platelets: 285 10*3/uL (ref 150–450)
RBC: 3.93 x10E6/uL (ref 3.77–5.28)
RDW: 14.6 % (ref 11.7–15.4)
WBC: 5.2 10*3/uL (ref 3.4–10.8)

## 2020-09-11 LAB — T4, FREE: Free T4: 0.96 ng/dL (ref 0.82–1.77)

## 2020-09-11 LAB — URINALYSIS, ROUTINE W REFLEX MICROSCOPIC
Bilirubin, UA: NEGATIVE
Ketones, UA: NEGATIVE
Leukocytes,UA: NEGATIVE
Nitrite, UA: NEGATIVE
Specific Gravity, UA: 1.011 (ref 1.005–1.030)
Urobilinogen, Ur: 0.2 mg/dL (ref 0.2–1.0)
pH, UA: 6 (ref 5.0–7.5)

## 2020-09-11 LAB — MICROALBUMIN / CREATININE URINE RATIO
Creatinine, Urine: 30 mg/dL
Microalb/Creat Ratio: 6826 mg/g creat — ABNORMAL HIGH (ref 0–29)
Microalbumin, Urine: 2047.8 ug/mL

## 2020-09-11 LAB — TSH: TSH: 4.99 u[IU]/mL — ABNORMAL HIGH (ref 0.450–4.500)

## 2020-09-11 LAB — HIV ANTIBODY (ROUTINE TESTING W REFLEX): HIV Screen 4th Generation wRfx: NONREACTIVE

## 2020-09-13 MED ORDER — FLUCONAZOLE 150 MG PO TABS: 150.0000 mg | ORAL_TABLET | Freq: Once | ORAL | 0 refills | Status: AC

## 2020-09-13 MED ORDER — LEVOTHYROXINE SODIUM 88 MCG PO TABS: 88.0000 ug | ORAL_TABLET | Freq: Every day | ORAL | 11 refills | Status: DC

## 2020-09-13 NOTE — Assessment & Plan Note (Signed)
Currently well controlled on Percocet. PDMP reviewed; appropriate. However, will need utox at next visit.   Plan: Percocet 5-325mg  1-2 times daily prn Utox at next visit

## 2020-09-13 NOTE — Assessment & Plan Note (Signed)
Patient reports a two day history of vaginal irritation and pruritis. She notes that she does not use harsh soaps/fragrances. She has not had any recent change in her detergents/soaps. She denies any vaginal discharge. She is not currently sexually active. She does have a history of prior yeast infections and reports that this may feel like one.  No significant findings noted on vaginal/cervical exam. Wet prep and urinalysis obtained. No concern for UTI at this time. However, wet prep was positive for BV.   Plan: Diflucan 150mg  x1

## 2020-09-13 NOTE — Assessment & Plan Note (Signed)
Patient endorses ongoing anxiety/depression with fluoxetine and amitriptyline. She notes that the fluoxetine is not helping much and she continues to have significant depression. Patient is also concerned about her weight.   Plan: Discontinue fluoxetine, start wellbutrin 150mg  daily Continue amitriptyline 100mg  qHS

## 2020-09-13 NOTE — Assessment & Plan Note (Signed)
HbA1c 6.0 at this visit. She is on Victoza 1.8mg  daily and Jardiance 10mg  once daily. She notes motivation for improved eating habits. She endorses frustration with ongoing weight gain.  Urinalysis with proteinuria and urine microalbumin/cr ratio 6800, this seems to be worsening over the past several years. Also noted to have worsening serum creatinine on BMP.   Plan: Continue treatment with Victoza and Jardiance Referral to nephrologist for worsening renal function

## 2020-09-13 NOTE — Assessment & Plan Note (Addendum)
BP Readings from Last 3 Encounters:  09/10/20 (!) 182/94  02/13/20 117/72  02/11/20 (!) 153/86   Patient noted to have significantly elevated BP on presentation with initial BP 201/98, repeat 182/94. She denies any headaches, vision changes, chest pain, dyspnea, or focal weakness. She is on amlodipine, metoprolol succinate, lisinopril and hydralazine and endorses medication compliance without any significant symptoms.   Plan: Increase hydralazine to 50mg  tid Continue amlodipine 10mg  daily, Toprol 25mg  daily Worsening renal function, will hold lisinopril for now  F/u in 4 weeks for BP and BMP check

## 2020-09-13 NOTE — Progress Notes (Signed)
CC: diffuse rash, vaginal irritation  HPI: [name] is a 45 y.o. female with PMHx as stated below presenting for evaluation of diffuse rash on bilateral upper extremities, face, and legs for several months duration. She notes that this is sometimes pruritic but mostly develops as "nodules" that resolve over the course of days but leave areas of hyperpigmentation.  She is also endorsing few days of vaginal irritation without discharge. Please see problem based charting for complete assessment and plan  Past Medical History:  Diagnosis Date  . Abdominal muscle pain 09/08/2016  . Abnormal Pap smear of cervix 2009  . Abscess    history of multiple abscesses  . Acute bilateral low back pain 02/13/2017  . Acute blood loss anemia   . Acute on chronic renal failure ([place]) 07/12/2012  . Acute renal failure ([place]) 07/12/2012  . Adjustment disorder with depressed mood 03/26/2017  . AKI (acute kidney injury) ([place])   . Anemia of chronic disease 2002  . Anxiety    Panic attacks  . Bilateral lower extremity edema 05/13/2016  . Bipolar disorder ([place])   . Cataract    B/L cataract  . Cellulitis 05/21/2014   right eye  . Chronic bronchitis ([place])    "get it q yr" (05/13/2013)  . Chronic pain   . Dehiscence of amputation stump ([place])   . Depression   . Edema of lower extremity   . Endocarditis 2002   subacute bacterial endocarditis.   . Family history of anesthesia complication    "my mom has a hard time coming out from under"  . Fibromyalgia   . GERD (gastroesophageal reflux disease)    occ  . Heart murmur   . Herpes simplex type 1 infection 01/16/2018  . History of blood transfusion    "just low blood count" (05/13/2013)  . Hyperlipidemia   . Hypertension   . Hypothyroidism   . Hypothyroidism, adult 03/21/2014  . Hypoxia   . Leukocytosis   . Necrosis ([place])    and ulceration  . Necrotizing fasciitis s/p OR debridements 07/06/2012  . Obesity   . OSA on CPAP    does not wear  all the time  . Peripheral neuropathy   . Pneumonia   . Type II diabetes mellitus (HCC)    Type  II  . Wound dehiscence 09/04/2019   Review of Systems:  Negative except as stated in HPI.  Physical Exam:  Vitals:   09/10/20 1040 09/10/20 1043 09/10/20 1200  BP: (!) 201/98 (!) 185/94 (!) 182/94  Pulse: 92 85 85  Temp: 98 F (36.7 C)    TempSrc: Oral    SpO2: 100%    Weight: 291 lb 1.6 oz (132 kg)    Height: 5\' 9"  (1.753 m)     Physical Exam  Constitutional: Appears well-developed and well-nourished. No distress.  HENT: Normocephalic and atraumatic, EOMI, conjunctiva normal, moist mucous membranes Cardiovascular: Normal rate, regular rhythm, S1 and S2 present, no murmurs, rubs, gallops.  Distal pulses intact Respiratory: Effort is normal.  Lungs are clear to auscultation bilaterally. Musculoskeletal: Normal bulk and tone.  No peripheral edema noted. LLE s/p AKA, stump without signs of infection/skin break down Neurological: Is alert and oriented x4, no apparent focal deficits noted. Skin: Warm and dry.  Multiple areas of hyperpigmentation noted over bilateral upper extremities; area of mild xerosis on chin Psychiatric: Normal mood and affect. Behavior is normal. Judgment and thought content normal.   Assessment & Plan:   See Encounters  Tab for problem based charting.  Patient discussed with Dr. Dareen Piano

## 2020-09-13 NOTE — Assessment & Plan Note (Signed)
BMP Latest Ref Rng & Units 09/10/2020 02/11/2020 12/31/2019  Glucose 65 - 99 mg/dL 81 135(H) 140(H)  BUN 6 - 24 mg/dL 67(H) 46(H) 32(H)  Creatinine 0.57 - 1.00 mg/dL 5.02(H) 3.13(H) 2.04(H)  BUN/Creat Ratio 9 - 23 13 - -  Sodium 134 - 144 mmol/L 142 137 140  Potassium 3.5 - 5.2 mmol/L 5.1 5.5(H) 5.1  Chloride 96 - 106 mmol/L 108(H) 110 111  CO2 20 - 29 mmol/L 15(L) 18(L) 20(L)  Calcium 8.7 - 10.2 mg/dL 8.5(L) 8.5(L) 8.8(L)   Patient has had worsening renal function over the past several visits. sCr is trending up to 5.02 at this visit with bicarb trending down to 15. She continues to take sodium bicarb twice daily. Patient does endorse some fatigue but denies any urinary difficulty or signs of uremia.  Urinalysis with proteinuria and urine microalbumin/cr ratio continues to increase and currently >6800. Suspect this is in setting of her chronic diabetes/hypertension.  Plan: Follow up with [facility] Repeat renal function panel in 1 week  Continue sodium bicarb

## 2020-09-13 NOTE — Assessment & Plan Note (Signed)
CBC Latest Ref Rng & Units 09/10/2020 02/11/2020 12/31/2019  WBC 3.4 - 10.8 x10E3/uL 5.2 5.2 6.0  Hemoglobin 11.1 - 15.9 g/dL 10.3(L) 10.9(L) 10.0(L)  Hematocrit 34.0 - 46.6 % 32.2(L) 34.8(L) 32.3(L)  Platelets 150 - 450 x10E3/uL 285 284 222   Iron/TIBC/Ferritin/ %Sat    Component Value Date/Time   IRON 56 09/10/2020 1212   TIBC 220 (L) 09/10/2020 1212   FERRITIN 135 09/10/2020 1212   IRONPCTSAT 25 09/10/2020 1212   IRONPCTSAT 15 (L) 07/09/2014 1133   Iron saturations consistent with anemia of chronic disease. Patient's anemia is stable and not currently on erythropoietin replacement therapy.   Plan: Continue to monitor

## 2020-09-13 NOTE — Assessment & Plan Note (Signed)
Patient with prior history of hypothyroidism previously on thyroid supplementation; however, has not been taking this over past several years. Her TSH has remained wnl; however, repeat TSH at this visit 4.900 with free T4 0.96. She expresses frustration with weight and skin issues concerning for hypothyroidism.  Plan: Start levothyroxine 86mcg daily  Repeat TSH in 4-6 weeks, uptitrate synthroid dosing as needed Consider anti-TPO Abs

## 2020-09-14 ENCOUNTER — Ambulatory Visit: Payer: Medicare HMO | Admitting: Rehabilitation

## 2020-09-14 ENCOUNTER — Encounter: Payer: Self-pay | Admitting: Orthopedic Surgery

## 2020-09-14 ENCOUNTER — Other Ambulatory Visit: Payer: Self-pay | Admitting: Student

## 2020-09-14 ENCOUNTER — Ambulatory Visit (INDEPENDENT_AMBULATORY_CARE_PROVIDER_SITE_OTHER): Payer: Medicare HMO | Admitting: Orthopedic Surgery

## 2020-09-14 ENCOUNTER — Other Ambulatory Visit: Payer: Self-pay

## 2020-09-14 VITALS — Ht 69.0 in | Wt 291.0 lb

## 2020-09-14 DIAGNOSIS — G894 Chronic pain syndrome: Secondary | ICD-10-CM

## 2020-09-14 DIAGNOSIS — Z89612 Acquired absence of left leg above knee: Secondary | ICD-10-CM | POA: Diagnosis not present

## 2020-09-14 DIAGNOSIS — R2681 Unsteadiness on feet: Secondary | ICD-10-CM

## 2020-09-14 NOTE — Therapy (Signed)
[facility] [address] [place], [place], [address] Phone: [phone]   Fax:  [phone]  Patient Details  Name: [name] [name] MRN: [id number] Date of Birth: [birth year] Referring Provider:  Newt Minion, MD  Encounter Date: 09/14/2020   Pt arrived to session ambulatory with RW.  She reports that she is having significant pain in residual limb and was very hot over the weekend and she also had pain/fever/chills on Friday.  PT called Bevely Palmer, PA over at [facility] to advise on what to do.  She would like to have her see Dr. Sharol Given this afternoon, therefore PT assisted in setting up appt and transportation for pt to see Dr. Sharol Given at [place], PT, MPT [facility] [address] [place], [place], [address] Phone: [phone]   Fax:  [phone] 09/14/20, 10:57 AM     Cameron Sprang, PT, MPT [facility] [address] [place], [place], [address] Phone: [phone]   Fax:  [phone] 09/14/20, 10:56 AM

## 2020-09-15 ENCOUNTER — Encounter: Payer: Self-pay | Admitting: *Deleted

## 2020-09-15 ENCOUNTER — Encounter: Payer: Self-pay | Admitting: Orthopedic Surgery

## 2020-09-15 LAB — CYTOLOGY - PAP
Adequacy: ABSENT
Comment: NEGATIVE
Diagnosis: NEGATIVE
High risk HPV: NEGATIVE

## 2020-09-15 NOTE — Progress Notes (Signed)
Office Visit Note   Patient: [name] [name]           Date of Birth: Oct 02, 1975           MRN: [id number] Visit Date: 09/14/2020              Requested by: Riesa Pope, MD [address] [place],  [place] [address] PCP: Riesa Pope, MD  Chief Complaint  Patient presents with  . Left Leg - Pain    Left above knee amputation 10/11/2019      HPI: Patient is a 45 year old woman with a left above-the-knee amputation.  Her residual limb is consolidated well.  She is having progressive decrease in volume in the residual limb has developed a callus over the end of the residual limb.  Patient states she currently wears a size 5 liner in the morning and increases to approximately 10 as the day progresses.  Patient is currently in physical therapy at this time.  Patient's 1 year status post amputation but currently has a new socket that she obtained in March.  Assessment & Plan: Visit Diagnoses:  1. Hx of AKA (above knee amputation), left ([place])     Plan: Recommended that she follow-up with [facility] for additional pairs of sock thickness.  Patient may need a liner filler until she can be fit for a new socket.  Follow-Up Instructions: Return in about 4 weeks (around 10/12/2020).   Ortho Exam  Patient is alert, oriented, no adenopathy, well-dressed, normal affect, normal respiratory effort. Examination patient has an end bearing thin callus over the residual limb.  Her residual limb is well consolidated there is no redness no cellulitis no ulcers no tenderness to palpation.  The callus is removed no signs of infection.  Imaging: No results found. No images are attached to the encounter.  Labs: Lab Results  Component Value Date   HGBA1C 6.0 (A) 09/10/2020   HGBA1C 8.5 (A) 02/13/2020   HGBA1C 8.5 (H) 09/04/2019   ESRSEDRATE 130 (H) 03/24/2017   ESRSEDRATE 53 (H) 09/14/2015   ESRSEDRATE 132 (H) 07/06/2012   CRP 13.6 (H) 02/14/2019   CRP 38.6 (H) 04/03/2018   CRP  5.4 (H) 09/14/2015   LABURIC 8.3 (H) 04/15/2019   REPTSTATUS 10/27/2019 FINAL 10/22/2019   GRAMSTAIN  09/04/2019    FEW WBC PRESENT, PREDOMINANTLY PMN MODERATE GRAM POSITIVE COCCI    CULT  10/22/2019    NO GROWTH 5 DAYS Performed at [facility], [place] [address] [place], [place] [address]    LABORGA STAPHYLOCOCCUS AUREUS 09/04/2019     Lab Results  Component Value Date   ALBUMIN 2.4 (L) 10/21/2019   ALBUMIN 2.3 (L) 09/18/2019   ALBUMIN 1.8 (L) 09/01/2019    Lab Results  Component Value Date   MG 2.3 04/07/2018   MG 2.1 04/05/2018   MG 2.4 03/25/2017   No results found for: VD25OH  No results found for: PREALBUMIN CBC EXTENDED Latest Ref Rng & Units 09/10/2020 02/11/2020 12/31/2019  WBC 3.4 - 10.8 x10E3/uL 5.2 5.2 6.0  RBC 3.77 - 5.28 x10E6/uL 3.93 4.28 3.93  HGB 11.1 - 15.9 g/dL 10.3(L) 10.9(L) 10.0(L)  HCT 34.0 - 46.6 % 32.2(L) 34.8(L) 32.3(L)  PLT 150 - 450 x10E3/uL 285 284 222  NEUTROABS 1.7 - 7.7 K/uL - - -  LYMPHSABS 0.7 - 4.0 K/uL - - -     Body mass index is 42.97 kg/m.  Orders:  No orders of the defined types were placed in this  encounter.  No orders of the defined types were placed in this encounter.    Procedures: No procedures performed  Clinical Data: No additional findings.  ROS:  All other systems negative, except as noted in the HPI. Review of Systems  Objective: Vital Signs: Ht 5\' 9"  (1.753 m)   Wt 291 lb (132 kg)   BMI 42.97 kg/m   Specialty Comments:  No specialty comments available.  PMFS History: Patient Active Problem List   Diagnosis Date Noted  . Cough 05/26/2020  . Chronic pain syndrome 01/16/2020  . Hx of AKA (above knee amputation), left ([place])   . Symptomatic anemia 10/21/2019  . Anemia 10/21/2019  . Acute respiratory failure with hypoxia ([place]) 10/21/2019  . Blurry vision, right eye 10/08/2019  . Tobacco use 03/01/2019  . Phantom limb pain ([place]) 11/08/2018  . Current moderate episode of major depressive  disorder ([place])   . Hyperkalemia   . Anemia of chronic disease   . Severe protein-calorie malnutrition ([place])   . Fall   . Status post below knee amputation, left ([place]) 04/11/2017  . Bipolar affective disorder ([place])   . Heart failure with preserved ejection fraction ([place]) 02/09/2017  . Routine adult health maintenance 12/08/2016  . Chronic pain of right knee 09/08/2016  . Chronic kidney disease (CKD), stage III (moderate) ([place]) 07/15/2016  . OSA (obstructive sleep apnea) 05/13/2016  . Vaginal irritation 03/14/2016  . Morbid obesity due to excess calories ([place]) 11/27/2015  . Subclinical hypothyroidism 04/25/2013  . Controlled type 2 diabetes mellitus ([place]) 07/12/2012  . Carpal tunnel syndrome, bilateral 01/25/2012  . Diabetic polyneuropathy associated with type 2 diabetes mellitus ([place]) 07/04/2011  . Anxiety and depression 06/02/2011  . GERD 12/06/2007  . Hyperlipidemia 08/29/2007  . Hypertension associated with diabetes ([place]) 08/29/2007   Past Medical History:  Diagnosis Date  . Abdominal muscle pain 09/08/2016  . Abnormal Pap smear of cervix 2009  . Abscess    history of multiple abscesses  . Acute bilateral low back pain 02/13/2017  . Acute blood loss anemia   . Acute on chronic renal failure ([place]) 07/12/2012  . Acute renal failure ([place]) 07/12/2012  . Adjustment disorder with depressed mood 03/26/2017  . AKI (acute kidney injury) ([place])   . Anemia of chronic disease 2002  . Anxiety    Panic attacks  . Bilateral lower extremity edema 05/13/2016  . Bipolar disorder ([place])   . Cataract    B/L cataract  . Cellulitis 05/21/2014   right eye  . Chronic bronchitis ([place])    "get it q yr" (05/13/2013)  . Chronic pain   . Dehiscence of amputation stump ([place])   . Depression   . Edema of lower extremity   . Endocarditis 2002   subacute bacterial endocarditis.   . Family history of anesthesia complication    "my mom has a hard time coming out from under"  . Fibromyalgia   . GERD  (gastroesophageal reflux disease)    occ  . Heart murmur   . Herpes simplex type 1 infection 01/16/2018  . History of blood transfusion    "just low blood count" (05/13/2013)  . Hyperlipidemia   . Hypertension   . Hypothyroidism   . Hypothyroidism, adult 03/21/2014  . Hypoxia   . Leukocytosis   . Necrosis ([place])    and ulceration  . Necrotizing fasciitis s/p OR debridements 07/06/2012  . Obesity   . OSA on CPAP    does not wear all the time  .  Peripheral neuropathy   . Pneumonia   . Type II diabetes mellitus (HCC)    Type  II  . Wound dehiscence 09/04/2019    Family History  Problem Relation Age of Onset  . Heart failure Mother   . Diabetes Mother   . Kidney disease Mother   . Kidney disease Father   . Diabetes Father   . Diabetes Paternal Grandmother   . Heart failure Paternal Grandmother   . Other Other     Past Surgical History:  Procedure Laterality Date  . ABDOMINAL AORTOGRAM W/LOWER EXTREMITY Left 03/19/2019   Procedure: ABDOMINAL AORTOGRAM W/LOWER EXTREMITY;  Surgeon: Serafina Mitchell, MD;  Location: [facility];  Service: Cardiovascular;  Laterality: Left;  . ABOVE KNEE LEG AMPUTATION Left 10/11/2019   wound dehisence   . AIR/FLUID EXCHANGE Right 12/31/2019   Procedure: AIR/FLUID EXCHANGE;  Surgeon: Jalene Mullet, MD;  Location: [facility];  Service: Ophthalmology;  Laterality: Right;  . AMPUTATION Left 04/07/2017   Procedure: LEFT BELOW KNEE AMPUTATION;  Surgeon: Newt Minion, MD;  Location: [facility];  Service: Orthopedics;  Laterality: Left;  . AMPUTATION Left 10/11/2019   Procedure: LEFT ABOVE KNEE AMPUTATION;  Surgeon: Newt Minion, MD;  Location: [facility];  Service: Orthopedics;  Laterality: Left;  . CATARACT EXTRACTION W/PHACO Right 02/11/2020   Procedure: CATARACT EXTRACTION PHACO AND INTRAOCULAR LENS PLACEMENT (IOC);  Surgeon: Jalene Mullet, MD;  Location: [facility];  Service: Ophthalmology;  Laterality: Right;  . CORONARY ARTERY BYPASS GRAFT    . EYE  SURGERY     lazer  . GAS INSERTION Right 12/31/2019   Procedure: INSERTION OF GAS;  Surgeon: Jalene Mullet, MD;  Location: [facility];  Service: Ophthalmology;  Laterality: Right;  . INCISION AND DRAINAGE ABSCESS     multiple I&Ds  . INCISION AND DRAINAGE ABSCESS Left 07/09/2012   Procedure: DRESSING CHANGE, THIGH WOUND;  Surgeon: Harl Bowie, MD;  Location: [facility];  Service: General;  Laterality: Left;  . INCISION AND DRAINAGE OF WOUND Left 07/07/2012   Procedure: IRRIGATION AND DEBRIDEMENT WOUND;  Surgeon: Harl Bowie, MD;  Location: [facility];  Service: General;  Laterality: Left;  . INCISION AND DRAINAGE PERIRECTAL ABSCESS Left 07/14/2012   Procedure: DEBRIDEMENT OF SKIN & SOFT TISSUE; DRESSING CHANGE UNDER ANESTHESIA;  Surgeon: Gayland Curry, MD,FACS;  Location: [facility];  Service: General;  Laterality: Left;  . INCISION AND DRAINAGE PERIRECTAL ABSCESS Left 07/16/2012   Procedure: I&D Left Thigh;  Surgeon: Gwenyth Ober, MD;  Location: [facility];  Service: General;  Laterality: Left;  . INCISION AND DRAINAGE PERIRECTAL ABSCESS N/A 01/05/2015   Procedure: IRRIGATION AND DEBRIDEMENT PERIRECTAL ABSCESS;  Surgeon: Donnie Mesa, MD;  Location: [facility];  Service: General;  Laterality: N/A;  . IRRIGATION AND DEBRIDEMENT ABSCESS Left 07/06/2012   Procedure: IRRIGATION AND DEBRIDEMENT ABSCESS BUTTOCKS AND THIGH;  Surgeon: Shann Medal, MD;  Location: [facility];  Service: General;  Laterality: Left;  . IRRIGATION AND DEBRIDEMENT ABSCESS Left 08/10/2012   Procedure: IRRIGATION AND DEBRIDEMENT ABSCESS;  Surgeon: Madilyn Hook, DO;  Location: [facility];  Service: General;  Laterality: Left;  . PARS PLANA VITRECTOMY Right 12/31/2019   Procedure: PARS PLANA VITRECTOMY WITH 25 GAUGE;  Surgeon: Jalene Mullet, MD;  Location: [facility];  Service: Ophthalmology;  Laterality: Right;  . PHOTOCOAGULATION WITH LASER Right 12/31/2019   Procedure: PHOTOCOAGULATION WITH LASER;  Surgeon: Jalene Mullet, MD;  Location: [facility];  Service:  Ophthalmology;  Laterality: Right;  .  REPAIR OF COMPLEX TRACTION RETINAL DETACHMENT Right 12/31/2019   Procedure: REPAIR OF COMPLEX TRACTION RETINAL;  Surgeon: Jalene Mullet, MD;  Location: [facility];  Service: Ophthalmology;  Laterality: Right;  . STUMP REVISION Left 06/09/2017   Procedure: REVISION LEFT BELOW KNEE AMPUTATION;  Surgeon: Newt Minion, MD;  Location: [facility];  Service: Orthopedics;  Laterality: Left;  . STUMP REVISION Left 09/04/2019   Procedure: LEFT BELOW KNEE AMPUTATION REVISION;  Surgeon: Newt Minion, MD;  Location: [facility];  Service: Orthopedics;  Laterality: Left;  . STUMP REVISION Left 09/20/2019   Procedure: LEFT BELOW KNEE AMPUTATION REVISION;  Surgeon: Newt Minion, MD;  Location: [facility];  Service: Orthopedics;  Laterality: Left;  . TONSILLECTOMY  1994   Social History   Occupational History  . Occupation: disability  Tobacco Use  . Smoking status: Current Every Day Smoker    Packs/day: 0.50    Years: 16.00    Pack years: 8.00    Types: Cigarettes    Last attempt to quit: 07/22/2019    Years since quitting: 1.1  . Smokeless tobacco: Never Used  Vaping Use  . Vaping Use: Former  . Devices: CBD oil  Substance and Sexual Activity  . Alcohol use: Yes    Alcohol/week: 0.0 standard drinks    Comment: social  . Drug use: No  . Sexual activity: Yes

## 2020-09-16 ENCOUNTER — Encounter: Payer: Self-pay | Admitting: Physical Therapy

## 2020-09-16 ENCOUNTER — Other Ambulatory Visit: Payer: Self-pay

## 2020-09-16 ENCOUNTER — Ambulatory Visit: Payer: Medicare HMO | Admitting: Physical Therapy

## 2020-09-16 ENCOUNTER — Telehealth: Payer: Self-pay | Admitting: Internal Medicine

## 2020-09-16 DIAGNOSIS — M25652 Stiffness of left hip, not elsewhere classified: Secondary | ICD-10-CM | POA: Diagnosis not present

## 2020-09-16 DIAGNOSIS — R2689 Other abnormalities of gait and mobility: Secondary | ICD-10-CM | POA: Diagnosis not present

## 2020-09-16 DIAGNOSIS — N898 Other specified noninflammatory disorders of vagina: Secondary | ICD-10-CM

## 2020-09-16 DIAGNOSIS — R293 Abnormal posture: Secondary | ICD-10-CM

## 2020-09-16 DIAGNOSIS — R2681 Unsteadiness on feet: Secondary | ICD-10-CM

## 2020-09-16 DIAGNOSIS — R296 Repeated falls: Secondary | ICD-10-CM | POA: Diagnosis not present

## 2020-09-16 MED ORDER — METRONIDAZOLE 500 MG PO TABS: 500.0000 mg | ORAL_TABLET | Freq: Two times a day (BID) | ORAL | 0 refills | Status: AC

## 2020-09-16 NOTE — Patient Instructions (Signed)
   Aquatic Therapy: What to Expect!  Where:  [facility] [address] [place], [place] [address] [phone]           How to Prepare: . Please make sure you drink 8 ounces of water about one hour prior to your pool session . A caregiver must attend the entire session with the patient (unless your primary therapists feels this is not necessary). The caregiver will be responsible for assisting with dressing as well as any toileting needs.  . Please arrive IN YOUR SUIT and a few minutes prior to your appointment - this helps to avoid delays in starting your session. . Please make sure to attend to any toileting needs prior to entering the pool . Once on the pool deck your therapist will ask you to sign the Patient  Consent and Assignment of Benefits form . Your therapist may take your blood pressure prior to, during and after your session if indicated . We usually try and create a home exercise program based on activities we do in the pool.  Please be thinking about who might be able to assist you in the pool should you want to participate in an aquatic home exercise program at the time of discharge.  Some patients do not want to or do not have the ability to participate in an aquatic home program - this is not a barrier in any way to you participating in aquatic therapy as part of your current therapy plan!    About the pool: 1. Entering the pool Your therapist will assist you; there are multiple ways to enter including stairs with railings, a walk in ramp, a roll in chair and a mechanical lift. Your therapist will determine the most appropriate way for you. 2. Water temperature is usually between 86-87 degrees 3. There may be other swimmers in the pool at the same time     Contact Info:             Appointments: [facility]         All sessions are 45 minutes   [place] [address]            Please call the [facility] if   [place], [place]  [address]           you need to cancel or reschedule an appointment.  [place], PT  Nita Sells, PTA Antony [place], OTR/L    11/13/19

## 2020-09-16 NOTE — Telephone Encounter (Signed)
Attempted to reach out to [name] [name] again. Patient did not pick up. Attempted to call family to for assistance in contacting [name] [name]. Family also did not pick up.

## 2020-09-16 NOTE — Telephone Encounter (Signed)
Attempted to call Ms.Raney to discuss need to make follow up appointment to discuss dosing adjustments to her meds and recent lab work as well as cancelling prior scripts from previous visit. Ms.Hartwig did not pick up. Left voicemail with callback number.

## 2020-09-16 NOTE — Progress Notes (Signed)
Internal Medicine Clinic Attending  Case discussed with Dr. Marva Panda  At the time of the visit.  We reviewed the resident's history and exam and pertinent patient test results.  I agree with the assessment, diagnosis, and plan of care documented in the resident's note.  Patient has a mildly elevated TSH and normal free T4 consistent with subclinical hypothyroidism. I would not initiate treatment with synthroid. Patient also noted to have BV. Treatment for this is metronidazole not diflucan. Will need to change to metronidazole. Patient also noted to have worsening creatinine up to 5 and eGFR of 10. Will need repeat BMP this week as well as adjustment of her medications to renal dosing and discontinuation of some meds given her GFR of <15. Case discussed with Dr.Lee who will speak to patient and make follow up appointment for her.

## 2020-09-17 DIAGNOSIS — E113512 Type 2 diabetes mellitus with proliferative diabetic retinopathy with macular edema, left eye: Secondary | ICD-10-CM | POA: Diagnosis not present

## 2020-09-17 NOTE — Telephone Encounter (Signed)
Attempted to reach out to Ms.Mcraney again. No response. Left voicemail with callback number

## 2020-09-18 ENCOUNTER — Encounter: Payer: Self-pay | Admitting: Internal Medicine

## 2020-09-18 NOTE — Therapy (Signed)
[facility] [address] [place], [place], [address] Phone: [phone]   Fax:  [phone]  Physical Therapy Treatment  Patient Details  Name: [name] [name] MRN: [id number] Date of Birth: 1975-05-25 Referring Provider (PT): Meridee Score, MD   Encounter Date: 09/16/2020     09/16/20 1700  PT Visits / Re-Eval  Visit Number 4  Number of Visits 17 (per updated POC)  Date for PT Re-Evaluation 10/25/20 (updated POC)  [place] and Medicaid (waiting approval dates)  Authorization - Visit Number 4  Progress Note Due on Visit 10  PT Time Calculation  PT Start Time 1018  PT Stop Time 1100  PT Time Calculation (min) 42 min  PT - End of Session  Equipment Utilized During Treatment Gait belt  Activity Tolerance Patient tolerated treatment well;No increased pain;Patient limited by fatigue  Behavior During Therapy [facility] for tasks assessed/performed    Past Medical History:  Diagnosis Date  . Abdominal muscle pain 09/08/2016  . Abnormal Pap smear of cervix 2009  . Abscess    history of multiple abscesses  . Acute bilateral low back pain 02/13/2017  . Acute blood loss anemia   . Acute on chronic renal failure ([place]) 07/12/2012  . Acute renal failure ([place]) 07/12/2012  . Adjustment disorder with depressed mood 03/26/2017  . AKI (acute kidney injury) ([place])   . Anemia of chronic disease 2002  . Anxiety    Panic attacks  . Bilateral lower extremity edema 05/13/2016  . Bipolar disorder ([place])   . Cataract    B/L cataract  . Cellulitis 05/21/2014   right eye  . Chronic bronchitis ([place])    "get it q yr" (05/13/2013)  . Chronic pain   . Dehiscence of amputation stump ([place])   . Depression   . Edema of lower extremity   . Endocarditis 2002   subacute bacterial endocarditis.   . Family history of anesthesia complication    "my mom has a hard time coming out from under"  . Fibromyalgia    . GERD (gastroesophageal reflux disease)    occ  . Heart murmur   . Herpes simplex type 1 infection 01/16/2018  . History of blood transfusion    "just low blood count" (05/13/2013)  . Hyperlipidemia   . Hypertension   . Hypothyroidism   . Hypothyroidism, adult 03/21/2014  . Hypoxia   . Leukocytosis   . Necrosis ([place])    and ulceration  . Necrotizing fasciitis s/p OR debridements 07/06/2012  . Obesity   . OSA on CPAP    does not wear all the time  . Peripheral neuropathy   . Pneumonia   . Type II diabetes mellitus (HCC)    Type  II  . Wound dehiscence 09/04/2019    Past Surgical History:  Procedure Laterality Date  . ABDOMINAL AORTOGRAM W/LOWER EXTREMITY Left 03/19/2019   Procedure: ABDOMINAL AORTOGRAM W/LOWER EXTREMITY;  Surgeon: Serafina Mitchell, MD;  Location: [facility];  Service: Cardiovascular;  Laterality: Left;  . ABOVE KNEE LEG AMPUTATION Left 10/11/2019   wound dehisence   . AIR/FLUID EXCHANGE Right 12/31/2019   Procedure: AIR/FLUID EXCHANGE;  Surgeon: Jalene Mullet, MD;  Location: [facility];  Service: Ophthalmology;  Laterality: Right;  . AMPUTATION Left 04/07/2017   Procedure: LEFT BELOW KNEE AMPUTATION;  Surgeon: Newt Minion, MD;  Location: [facility];  Service: Orthopedics;  Laterality: Left;  . AMPUTATION Left 10/11/2019   Procedure: LEFT ABOVE  KNEE AMPUTATION;  Surgeon: Newt Minion, MD;  Location: [facility];  Service: Orthopedics;  Laterality: Left;  . CATARACT EXTRACTION W/PHACO Right 02/11/2020   Procedure: CATARACT EXTRACTION PHACO AND INTRAOCULAR LENS PLACEMENT (IOC);  Surgeon: Jalene Mullet, MD;  Location: [place];  Service: Ophthalmology;  Laterality: Right;  . CORONARY ARTERY BYPASS GRAFT    . EYE SURGERY     lazer  . GAS INSERTION Right 12/31/2019   Procedure: INSERTION OF GAS;  Surgeon: Jalene Mullet, MD;  Location: [place] [facility];  Service: Ophthalmology;  Laterality: Right;  . INCISION AND DRAINAGE ABSCESS     multiple I&Ds  . INCISION AND DRAINAGE  ABSCESS Left 07/09/2012   Procedure: DRESSING CHANGE, THIGH WOUND;  Surgeon: Harl Bowie, MD;  Location: [facility];  Service: General;  Laterality: Left;  . INCISION AND DRAINAGE OF WOUND Left 07/07/2012   Procedure: IRRIGATION AND DEBRIDEMENT WOUND;  Surgeon: Harl Bowie, MD;  Location: [facility];  Service: General;  Laterality: Left;  . INCISION AND DRAINAGE PERIRECTAL ABSCESS Left 07/14/2012   Procedure: DEBRIDEMENT OF SKIN & SOFT TISSUE; DRESSING CHANGE UNDER ANESTHESIA;  Surgeon: Gayland Curry, MD,FACS;  Location: [place] [facility];  Service: General;  Laterality: Left;  . INCISION AND DRAINAGE PERIRECTAL ABSCESS Left 07/16/2012   Procedure: I&D Left Thigh;  Surgeon: Gwenyth Ober, MD;  Location: [facility];  Service: General;  Laterality: Left;  . INCISION AND DRAINAGE PERIRECTAL ABSCESS N/A 01/05/2015   Procedure: IRRIGATION AND DEBRIDEMENT PERIRECTAL ABSCESS;  Surgeon: Donnie [place], MD;  Location: [facility];  Service: General;  Laterality: N/A;  . IRRIGATION AND DEBRIDEMENT ABSCESS Left 07/06/2012   Procedure: IRRIGATION AND DEBRIDEMENT ABSCESS BUTTOCKS AND THIGH;  Surgeon: Shann Medal, MD;  Location: [place];  Service: General;  Laterality: Left;  . IRRIGATION AND DEBRIDEMENT ABSCESS Left 08/10/2012   Procedure: IRRIGATION AND DEBRIDEMENT ABSCESS;  Surgeon: Madilyn Hook, DO;  Location: [facility];  Service: General;  Laterality: Left;  . PARS PLANA VITRECTOMY Right 12/31/2019   Procedure: PARS PLANA VITRECTOMY WITH 25 GAUGE;  Surgeon: Jalene Mullet, MD;  Location: [facility];  Service: Ophthalmology;  Laterality: Right;  . PHOTOCOAGULATION WITH LASER Right 12/31/2019   Procedure: PHOTOCOAGULATION WITH LASER;  Surgeon: Jalene Mullet, MD;  Location: [facility];  Service: Ophthalmology;  Laterality: Right;  . REPAIR OF COMPLEX TRACTION RETINAL DETACHMENT Right 12/31/2019   Procedure: REPAIR OF COMPLEX TRACTION RETINAL;  Surgeon: Jalene Mullet, MD;  Location: [facility];  Service: Ophthalmology;  Laterality: Right;  . STUMP  REVISION Left 06/09/2017   Procedure: REVISION LEFT BELOW KNEE AMPUTATION;  Surgeon: Newt Minion, MD;  Location: [facility];  Service: Orthopedics;  Laterality: Left;  . STUMP REVISION Left 09/04/2019   Procedure: LEFT BELOW KNEE AMPUTATION REVISION;  Surgeon: Newt Minion, MD;  Location: [facility];  Service: Orthopedics;  Laterality: Left;  . STUMP REVISION Left 09/20/2019   Procedure: LEFT BELOW KNEE AMPUTATION REVISION;  Surgeon: Newt Minion, MD;  Location: [facility];  Service: Orthopedics;  Laterality: Left;  . TONSILLECTOMY  1994    There were no vitals filed for this visit.     09/16/20 1023  Symptoms/Limitations  Subjective No new complaints. No falls. Saw Dr. Sharol Given Monday. He feels she was bottoming out in socket. Advised pt to call [facility] to get more socks and/or have padding added to socket until she qualifies for another socket revision. Having less pain/discomfort today.  Limitations Walking;House hold activities;Standing  How long can you stand comfortably?  5 mins  How long can you walk comfortably? 5 mins  Patient Stated Goals "I want to be able to walk on this leg and move around"  Pain Assessment  Currently in Pain? Yes  Pain Score 7  Pain Location Knee  Pain Orientation Right  Pain Descriptors / Indicators Aching;Sore  Pain Type Chronic pain  Pain Onset More than a month ago  Pain Frequency Constant  Aggravating Factors  OA, weather, increased standing/walking long  Pain Relieving Factors rest, medication        09/16/20 1043  Transfers  Transfers Sit to Stand;Stand to Sit  Sit to Stand 5: Supervision;With upper extremity assist;From bed  Stand to Sit 5: Supervision;With upper extremity assist;To chair/3-in-1  Ambulation/Gait  Ambulation/Gait Yes  Ambulation/Gait Assistance 5: Supervision  Ambulation/Gait Assistance Details continued cues for prosthetic knee flexion with swing phase, weight shifting and posture. Continues to have heavy reliance on RW with gait.   Ambulation Distance (Feet) 100 Feet (x2, 120 x2)  Assistive device Rolling walker;Prosthesis  Gait Pattern Step-through pattern;Decreased stride length;Decreased stance time - left;Decreased step length - right;Decreased trunk rotation;Trunk flexed;Wide base of support  Ambulation Surface Level;Indoor  Self-Care  Self-Care Other Self-Care Comments  Other Self-Care Comments  discussed possible aquatics as an additon to treatment as put in plan by PT. Pt in agreement. Provided information handout and reviewed with patient.  Neuro Re-ed   Neuro Re-ed Details  for balance with decreased UE support: with feet staggered with prosthesis back/right LE slightly forward for EC 30 sec's x 3 reps, then EC head movements left<>right, up<>down for ~5 reps each. min guard to min assist for balance.         PT Short Term Goals - 08/26/20 1151      PT SHORT TERM GOAL #1   Title Pt will be independent with ongoing standing HEP with prosthesis.   (Target Date: 09/25/20)    Time 4    Period Weeks    Status New    Target Date 09/25/20      PT SHORT TERM GOAL #2   Title Pt will be able to tolerate wearing prosthesis 5 hrs, 2x/day with no skin issues and with good understanding of swelling management.    Baseline wearing 4 hrs, 2x/day    Time 4    Period Weeks    Status New      PT SHORT TERM GOAL #3   Title Pt will  perform dynamic tasks with intermittent UE support x 10 mins at S level and reach to floor level to retrieve item.    Baseline Can stand for 5 mins at a time    Time 4    Period Weeks    Status New      PT SHORT TERM GOAL #4   Title Pt will ambulate x 300' with RW at mod I level without rest break in order to indicate improved activity tolerance.    Time 4    Period Weeks    Status New      PT SHORT TERM GOAL #5   Title Pt will improve gait speed to >/=1.44 ft/sec w/ RW and prosthesis to indicate dec fall risk.    Baseline .84 ft/sec with wide RW    Time 4    Period Weeks     Status New             PT Long Term Goals - 08/26/20 1155      PT LONG  TERM GOAL #1   Title Pt will be independent with final HEP in order to indicate improved functional mobility and balance with use of prosthesis.  (Target Date: 10/25/20)    Time 8    Period Weeks    Status New      PT LONG TERM GOAL #2   Title Pt will be able to tolerate wearing for 90% of awake hours without skin breakdown and with good understanding of swelling mangement.    Baseline 4hrs, 2x/day, still having some swelling    Time 8    Period Weeks    Status New      PT LONG TERM GOAL #3   Title Pt will tolerate standing x 15 mins while performing tasks intermittently without UE support and pick up item from floor with single UE support at mod I level.    Baseline able to stand 5 mins at a time    Time 8    Period Weeks    Status New      PT LONG TERM GOAL #4   Title Pt will improve gait speed to >/=2.00 ft/sec with LRAD and prosthesis to indicate dec fall risk.    Baseline .63 ft/sec with wide RW    Time 8    Period Weeks    Status New      PT LONG TERM GOAL #5   Title Pt will ambulate x 25' w/ SBQC (or LRAD) and prosthesis at min A level in order to indicate improved independence with gait.    Baseline 10' at min A level    Time 8    Period Weeks    Status New      Additional Long Term Goals   Additional Long Term Goals Yes      PT LONG TERM GOAL #6   Title Pt will negotiate up/down 3 steps with single rail and crutch (or LRAD) at S level in order to enter/exit home safely.    Time 8    Period Weeks    Status New             09/16/20 1630  Plan  Clinical Impression Statement Today's skilled session continued to focus on gait with prosthesis/RW and standing balance with decreased UE support. Rest breaks needed due to knee pain on left side. Pt is interested in aquatics, referrall placed to aquatics PT and information reviewed with patient this session. The patient is progressing and  should benefit from continued PT to progress toward unmet goals.  Personal Factors and Comorbidities Comorbidity 3+;Past/Current Experience;Time since onset of injury/illness/exacerbation  Comorbidities see above  Examination-Activity Limitations Bend;Carry;Dressing;Stand;Stairs;Squat;Locomotion Level;Transfers  Examination-Participation Restrictions Community Activity;Meal Prep;Shop;Laundry  Pt will benefit from skilled therapeutic intervention in order to improve on the following deficits Abnormal gait;Decreased activity tolerance;Decreased balance;Decreased endurance;Decreased knowledge of precautions;Decreased knowledge of use of DME;Decreased mobility;Decreased range of motion;Decreased strength;Difficulty walking;Increased edema;Impaired perceived functional ability;Impaired flexibility;Impaired sensation;Postural dysfunction;Improper body mechanics;Prosthetic Dependency;Pain  Stability/Clinical Decision Making Evolving/Moderate complexity  Rehab Potential Good  PT Frequency 2x / week  PT Duration 8 weeks  PT Treatment/Interventions ADLs/Self Care Home Management;DME Instruction;Gait training;Stair training;Functional mobility training;Therapeutic activities;Therapeutic exercise;Balance training;Neuromuscular re-education;Patient/family education;Prosthetic Training;Compression bandaging;Passive range of motion;Energy conservation;Iontophoresis 4mg /ml Dexamethasone;Cryotherapy;Aquatic Therapy  PT Next Visit Plan Update HEP as needed based on what she is doing at home already,  Work on her activating knee/feeling comfortable being on prosthesis, balance, endurance  Consulted and Agree with Plan of Care Patient  Patient will benefit from skilled therapeutic intervention in order to improve the following deficits and impairments:  Abnormal gait,Decreased activity tolerance,Decreased balance,Decreased endurance,Decreased knowledge of precautions,Decreased knowledge of use of  DME,Decreased mobility,Decreased range of motion,Decreased strength,Difficulty walking,Increased edema,Impaired perceived functional ability,Impaired flexibility,Impaired sensation,Postural dysfunction,Improper body mechanics,Prosthetic Dependency,Pain  Visit Diagnosis: Unsteadiness on feet  Abnormal posture  Hip stiffness, left  Other abnormalities of gait and mobility  Repeated falls     Problem List Patient Active Problem List   Diagnosis Date Noted  . Cough 05/26/2020  . Chronic pain syndrome 01/16/2020  . Hx of AKA (above knee amputation), left ([place])   . Symptomatic anemia 10/21/2019  . Anemia 10/21/2019  . Acute respiratory failure with hypoxia ([place]) 10/21/2019  . Blurry vision, right eye 10/08/2019  . Tobacco use 03/01/2019  . Phantom limb pain ([place]) 11/08/2018  . Current moderate episode of major depressive disorder ([place])   . Hyperkalemia   . Anemia of chronic disease   . Severe protein-calorie malnutrition ([place])   . Fall   . Status post below knee amputation, left ([place]) 04/11/2017  . Bipolar affective disorder ([place])   . Heart failure with preserved ejection fraction ([place]) 02/09/2017  . Routine adult health maintenance 12/08/2016  . Chronic pain of right knee 09/08/2016  . Chronic kidney disease (CKD), stage III (moderate) ([place]) 07/15/2016  . OSA (obstructive sleep apnea) 05/13/2016  . Vaginal irritation 03/14/2016  . Morbid obesity due to excess calories ([place]) 11/27/2015  . Subclinical hypothyroidism 04/25/2013  . Controlled type 2 diabetes mellitus ([place]) 07/12/2012  . Carpal tunnel syndrome, bilateral 01/25/2012  . Diabetic polyneuropathy associated with type 2 diabetes mellitus ([place]) 07/04/2011  . Anxiety and depression 06/02/2011  . GERD 12/06/2007  . Hyperlipidemia 08/29/2007  . Hypertension associated with diabetes ([place]) 08/29/2007    Willow Ora, PTA, [facility] [address], [place], [place]  [address] [phone] 09/18/20, 8:31 AM   Name: KAYMARIE WYNN MRN: [id number] Date of Birth: 07-18-1975

## 2020-09-20 DIAGNOSIS — Z89512 Acquired absence of left leg below knee: Secondary | ICD-10-CM | POA: Diagnosis not present

## 2020-09-21 ENCOUNTER — Ambulatory Visit: Payer: Medicare HMO | Admitting: Rehabilitation

## 2020-09-21 ENCOUNTER — Telehealth: Payer: Self-pay

## 2020-09-21 NOTE — Telephone Encounter (Signed)
Pt has an appt on 5/13.

## 2020-09-21 NOTE — Telephone Encounter (Signed)
Return pt's call - she wants to know why Ibuprofen refill was denied. Informed of Dr Marguerita Beards response "CKD3 not appropriate for chronic NSAID use". Pt states she going to PT and Percocet alone is not covering the pain which is why takes it along with the Ibuprofen. She wants the doctor to call her to let her know what's the next step. Thanks

## 2020-09-21 NOTE — Telephone Encounter (Signed)
Requesting to speak with a nurse about getting ibuprofen to be filled. Pt states the doctor denied this Rx. Pt would like a call back.

## 2020-09-21 NOTE — Telephone Encounter (Signed)
Called patient. Discussed with her recent lab findings and that she should take metronidazole prescribed and not take levothyroxine. Also discussed with her the need to be seen in clinic to discuss these multiple concerns she has. Patient agrees, front desk to call pt and schedule appt.

## 2020-09-23 ENCOUNTER — Other Ambulatory Visit: Payer: Self-pay

## 2020-09-23 ENCOUNTER — Encounter: Payer: Self-pay | Admitting: Physical Therapy

## 2020-09-23 ENCOUNTER — Ambulatory Visit: Payer: Medicare HMO | Attending: Orthopedic Surgery | Admitting: Physical Therapy

## 2020-09-23 DIAGNOSIS — M25652 Stiffness of left hip, not elsewhere classified: Secondary | ICD-10-CM | POA: Insufficient documentation

## 2020-09-23 DIAGNOSIS — R296 Repeated falls: Secondary | ICD-10-CM | POA: Insufficient documentation

## 2020-09-23 DIAGNOSIS — R293 Abnormal posture: Secondary | ICD-10-CM | POA: Diagnosis not present

## 2020-09-23 DIAGNOSIS — R2689 Other abnormalities of gait and mobility: Secondary | ICD-10-CM | POA: Diagnosis not present

## 2020-09-23 DIAGNOSIS — R2681 Unsteadiness on feet: Secondary | ICD-10-CM | POA: Insufficient documentation

## 2020-09-24 DIAGNOSIS — E113513 Type 2 diabetes mellitus with proliferative diabetic retinopathy with macular edema, bilateral: Secondary | ICD-10-CM | POA: Diagnosis not present

## 2020-09-24 DIAGNOSIS — H3582 Retinal ischemia: Secondary | ICD-10-CM | POA: Diagnosis not present

## 2020-09-24 DIAGNOSIS — E113511 Type 2 diabetes mellitus with proliferative diabetic retinopathy with macular edema, right eye: Secondary | ICD-10-CM | POA: Diagnosis not present

## 2020-09-24 DIAGNOSIS — E113512 Type 2 diabetes mellitus with proliferative diabetic retinopathy with macular edema, left eye: Secondary | ICD-10-CM | POA: Diagnosis not present

## 2020-09-24 DIAGNOSIS — H3509 Other intraretinal microvascular abnormalities: Secondary | ICD-10-CM | POA: Diagnosis not present

## 2020-09-24 DIAGNOSIS — H3581 Retinal edema: Secondary | ICD-10-CM | POA: Diagnosis not present

## 2020-09-24 NOTE — Therapy (Signed)
[facility] [address] [place], [place], [address] Phone: [phone]   Fax:  [phone]  Physical Therapy Treatment  Patient Details  Name: [name] [name] MRN: [id number] Date of Birth: 12-08-1975 Referring Provider (PT): Meridee Score, MD   Encounter Date: 09/23/2020   PT End of Session - 09/23/20 1032    Visit Number 5    Number of Visits 17   per updated POC   Date for PT Re-Evaluation 10/25/20   updated POC   Authorization Type [employer] [place] and Medicaid (waiting approval dates)    Authorization - Visit Number 5    Progress Note Due on Visit 10    PT Start Time 1016    PT Stop Time 1100    PT Time Calculation (min) 44 min    Equipment Utilized During Treatment Gait belt    Activity Tolerance Patient tolerated treatment well;No increased pain;Patient limited by fatigue    Behavior During Therapy [facility] for tasks assessed/performed           Past Medical History:  Diagnosis Date  . Abdominal muscle pain 09/08/2016  . Abnormal Pap smear of cervix 2009  . Abscess    history of multiple abscesses  . Acute bilateral low back pain 02/13/2017  . Acute blood loss anemia   . Acute on chronic renal failure ([place]) 07/12/2012  . Acute renal failure ([place]) 07/12/2012  . Adjustment disorder with depressed mood 03/26/2017  . AKI (acute kidney injury) ([place])   . Anemia of chronic disease 2002  . Anxiety    Panic attacks  . Bilateral lower extremity edema 05/13/2016  . Bipolar disorder ([place])   . Cataract    B/L cataract  . Cellulitis 05/21/2014   right eye  . Chronic bronchitis ([employer])    "get it q yr" (05/13/2013)  . Chronic pain   . Dehiscence of amputation stump ([place])   . Depression   . Edema of lower extremity   . Endocarditis 2002   subacute bacterial endocarditis.   . Family history of anesthesia complication    "my mom has a hard time coming out from under"  . Fibromyalgia   . GERD (gastroesophageal reflux  disease)    occ  . Heart murmur   . Herpes simplex type 1 infection 01/16/2018  . History of blood transfusion    "just low blood count" (05/13/2013)  . Hyperlipidemia   . Hypertension   . Hypothyroidism   . Hypothyroidism, adult 03/21/2014  . Hypoxia   . Leukocytosis   . Necrosis ([place])    and ulceration  . Necrotizing fasciitis s/p OR debridements 07/06/2012  . Obesity   . OSA on CPAP    does not wear all the time  . Peripheral neuropathy   . Pneumonia   . Type II diabetes mellitus (HCC)    Type  II  . Wound dehiscence 09/04/2019    Past Surgical History:  Procedure Laterality Date  . ABDOMINAL AORTOGRAM W/LOWER EXTREMITY Left 03/19/2019   Procedure: ABDOMINAL AORTOGRAM W/LOWER EXTREMITY;  Surgeon: Serafina Mitchell, MD;  Location: [facility];  Service: Cardiovascular;  Laterality: Left;  . ABOVE KNEE LEG AMPUTATION Left 10/11/2019   wound dehisence   . AIR/FLUID EXCHANGE Right 12/31/2019   Procedure: AIR/FLUID EXCHANGE;  Surgeon: Jalene Mullet, MD;  Location: [facility];  Service: Ophthalmology;  Laterality: Right;  . AMPUTATION Left 04/07/2017   Procedure: LEFT BELOW KNEE AMPUTATION;  Surgeon: Newt Minion, MD;  Location: [facility];  Service: Orthopedics;  Laterality: Left;  . AMPUTATION Left 10/11/2019   Procedure: LEFT ABOVE KNEE AMPUTATION;  Surgeon: Newt Minion, MD;  Location: [facility];  Service: Orthopedics;  Laterality: Left;  . CATARACT EXTRACTION W/PHACO Right 02/11/2020   Procedure: CATARACT EXTRACTION PHACO AND INTRAOCULAR LENS PLACEMENT (IOC);  Surgeon: Jalene Mullet, MD;  Location: [facility];  Service: Ophthalmology;  Laterality: Right;  . CORONARY ARTERY BYPASS GRAFT    . EYE SURGERY     [place]  . GAS INSERTION Right 12/31/2019   Procedure: INSERTION OF GAS;  Surgeon: Jalene Mullet, MD;  Location: [facility];  Service: Ophthalmology;  Laterality: Right;  . INCISION AND DRAINAGE ABSCESS     multiple I&Ds  . INCISION AND DRAINAGE ABSCESS Left 07/09/2012   Procedure:  DRESSING CHANGE, THIGH WOUND;  Surgeon: Harl Bowie, MD;  Location: [facility];  Service: General;  Laterality: Left;  . INCISION AND DRAINAGE OF WOUND Left 07/07/2012   Procedure: IRRIGATION AND DEBRIDEMENT WOUND;  Surgeon: Harl Bowie, MD;  Location: [facility];  Service: General;  Laterality: Left;  . INCISION AND DRAINAGE PERIRECTAL ABSCESS Left 07/14/2012   Procedure: DEBRIDEMENT OF SKIN & SOFT TISSUE; DRESSING CHANGE UNDER ANESTHESIA;  Surgeon: Gayland Curry, MD,FACS;  Location: [facility];  Service: General;  Laterality: Left;  . INCISION AND DRAINAGE PERIRECTAL ABSCESS Left 07/16/2012   Procedure: I&D Left Thigh;  Surgeon: Gwenyth Ober, MD;  Location: [facility];  Service: General;  Laterality: Left;  . INCISION AND DRAINAGE PERIRECTAL ABSCESS N/A 01/05/2015   Procedure: IRRIGATION AND DEBRIDEMENT PERIRECTAL ABSCESS;  Surgeon: Donnie Mesa, MD;  Location: [facility];  Service: General;  Laterality: N/A;  . IRRIGATION AND DEBRIDEMENT ABSCESS Left 07/06/2012   Procedure: IRRIGATION AND DEBRIDEMENT ABSCESS BUTTOCKS AND THIGH;  Surgeon: Shann Medal, MD;  Location: [facility];  Service: General;  Laterality: Left;  . IRRIGATION AND DEBRIDEMENT ABSCESS Left 08/10/2012   Procedure: IRRIGATION AND DEBRIDEMENT ABSCESS;  Surgeon: Madilyn Hook, DO;  Location: [facility];  Service: General;  Laterality: Left;  . PARS PLANA VITRECTOMY Right 12/31/2019   Procedure: PARS PLANA VITRECTOMY WITH 25 GAUGE;  Surgeon: Jalene Mullet, MD;  Location: [facility];  Service: Ophthalmology;  Laterality: Right;  . PHOTOCOAGULATION WITH LASER Right 12/31/2019   Procedure: PHOTOCOAGULATION WITH LASER;  Surgeon: Jalene Mullet, MD;  Location: [facility];  Service: Ophthalmology;  Laterality: Right;  . REPAIR OF COMPLEX TRACTION RETINAL DETACHMENT Right 12/31/2019   Procedure: REPAIR OF COMPLEX TRACTION RETINAL;  Surgeon: Jalene Mullet, MD;  Location: [facility];  Service: Ophthalmology;  Laterality: Right;  . STUMP REVISION Left 06/09/2017   Procedure:  REVISION LEFT BELOW KNEE AMPUTATION;  Surgeon: Newt Minion, MD;  Location: [facility];  Service: Orthopedics;  Laterality: Left;  . STUMP REVISION Left 09/04/2019   Procedure: LEFT BELOW KNEE AMPUTATION REVISION;  Surgeon: Newt Minion, MD;  Location: [facility];  Service: Orthopedics;  Laterality: Left;  . STUMP REVISION Left 09/20/2019   Procedure: LEFT BELOW KNEE AMPUTATION REVISION;  Surgeon: Newt Minion, MD;  Location: [facility];  Service: Orthopedics;  Laterality: Left;  . TONSILLECTOMY  1994    There were no vitals filed for this visit.   Subjective Assessment - 09/23/20 1029    Subjective No new complaints. Noted rotation of prosthesis medially with gait into session today.    Limitations Walking;House hold activities;Standing    How long can you stand comfortably? 5 mins    How  long can you walk comfortably? 5 mins    Patient Stated Goals "I want to be able to walk on this leg and move around"    Currently in Pain? Yes    Pain Score 7     Pain Location Knee    Pain Orientation Right    Pain Descriptors / Indicators Aching;Sore    Pain Type Chronic pain    Pain Onset More than a month ago    Pain Frequency Constant    Aggravating Factors  OA, weather, increased standing/walking long periods of time    Pain Relieving Factors rest, medication              [facility] Adult PT Treatment/Exercise - 09/23/20 1033      Transfers   Transfers Sit to Stand;Stand to Sit    Sit to Stand 5: Supervision;With upper extremity assist;From bed    Stand to Sit 5: Supervision;With upper extremity assist;To chair/3-in-1      Ambulation/Gait   Ambulation/Gait Yes    Ambulation/Gait Assistance 5: Supervision    Ambulation/Gait Assistance Details once strap corrected for improved alignment worked on gait in session cues for prosthettic knee flexion with swing phase vs circumduction with knee straight, cues for step placment for improved base of support. no imbalance noted.    Ambulation Distance (Feet)  100 Feet   x2, plus around gym with session   Assistive device Rolling walker;Prosthesis    Gait Pattern Step-through pattern;Decreased stride length;Decreased stance time - left;Decreased step length - right;Decreased trunk rotation;Trunk flexed;Wide base of support    Ambulation Surface Level;Indoor      High Level Balance   High Level Balance Activities Side stepping;Backward walking    High Level Balance Comments in parallel bars- min guard assist for 4 laps each with cues on posture, step length/placement and weight shifting.      Knee/Hip Exercises: Aerobic   Stepper Scifit with UE (#8) and LE's (seat at 23) for 8 minutes on level 2.5 with goal 75-85 steps per minutes for strengthening and activity tolerance. Had upper back portion angeled back to allow increased range at knee and left prosthetic foot strapped in to prevent it from moving up with motions. Used fan with cool down for additional 2 minutes (preset Scifit parameters).      Prosthetics   Prosthetic Care Comments  strap on liner noted to be loose when checking alignment due to rotation entering gym. Strap screw was tightened by PTA with no further rotation noted until end of session when pt reported prosthesis feeling loose/needing another sock. Was going to apply one while in bathroom after session.    Current prosthetic wear tolerance (days/week)  daily     Current prosthetic wear tolerance (#hours/day)  4hrs, 2x/day for 2-3 days per week    Residual limb condition  Pt reports no skin issues    Education Provided Correct ply sock adjustment;Proper wear schedule/adjustment;Proper weight-bearing schedule/adjustment    Person(s) Educated Patient    Education Method Explanation;Demonstration;Verbal cues    Education Method Verbalized understanding;Returned demonstration;Verbal cues required;Needs further instruction    Donning Prosthesis Modified independent (device/increased time)              PT Short Term Goals -  08/26/20 1151      PT SHORT TERM GOAL #1   Title Pt will be independent with ongoing standing HEP with prosthesis.   (Target Date: 09/25/20)    Time 4    Period Weeks  Status New    Target Date 09/25/20      PT SHORT TERM GOAL #2   Title Pt will be able to tolerate wearing prosthesis 5 hrs, 2x/day with no skin issues and with good understanding of swelling management.    Baseline wearing 4 hrs, 2x/day    Time 4    Period Weeks    Status New      PT SHORT TERM GOAL #3   Title Pt will  perform dynamic tasks with intermittent UE support x 10 mins at S level and reach to floor level to retrieve item.    Baseline Can stand for 5 mins at a time    Time 4    Period Weeks    Status New      PT SHORT TERM GOAL #4   Title Pt will ambulate x 300' with RW at mod I level without rest break in order to indicate improved activity tolerance.    Time 4    Period Weeks    Status New      PT SHORT TERM GOAL #5   Title Pt will improve gait speed to >/=1.44 ft/sec w/ RW and prosthesis to indicate dec fall risk.    Baseline .16 ft/sec with wide RW    Time 4    Period Weeks    Status New             PT Long Term Goals - 08/26/20 1155      PT LONG TERM GOAL #1   Title Pt will be independent with final HEP in order to indicate improved functional mobility and balance with use of prosthesis.  (Target Date: 10/25/20)    Time 8    Period Weeks    Status New      PT LONG TERM GOAL #2   Title Pt will be able to tolerate wearing for 90% of awake hours without skin breakdown and with good understanding of swelling mangement.    Baseline 4hrs, 2x/day, still having some swelling    Time 8    Period Weeks    Status New      PT LONG TERM GOAL #3   Title Pt will tolerate standing x 15 mins while performing tasks intermittently without UE support and pick up item from floor with single UE support at mod I level.    Baseline able to stand 5 mins at a time    Time 8    Period Weeks    Status New       PT LONG TERM GOAL #4   Title Pt will improve gait speed to >/=2.00 ft/sec with LRAD and prosthesis to indicate dec fall risk.    Baseline .58 ft/sec with wide RW    Time 8    Period Weeks    Status New      PT LONG TERM GOAL #5   Title Pt will ambulate x 25' w/ SBQC (or LRAD) and prosthesis at min A level in order to indicate improved independence with gait.    Baseline 10' at min A level    Time 8    Period Weeks    Status New      Additional Long Term Goals   Additional Long Term Goals Yes      PT LONG TERM GOAL #6   Title Pt will negotiate up/down 3 steps with single rail and crutch (or LRAD) at S level in order to enter/exit home safely.  Time 8    Period Weeks    Status New               Plan - 09/23/20 1032    Clinical Impression Statement Today's skilled session continued to focus on gait training with prosthesis, balance and activity tolerance. No issues noted or reported by pt in session. The pt is making steady progress toward goals and should benefit from continued PT to progress toward unmet goals.    Personal Factors and Comorbidities Comorbidity 3+;Past/Current Experience;Time since onset of injury/illness/exacerbation    Comorbidities see above    Examination-Activity Limitations Bend;Carry;Dressing;Stand;Stairs;Squat;Locomotion Level;Transfers    Examination-Participation Restrictions Community Activity;Meal Prep;Shop;Laundry    Stability/Clinical Decision Making Evolving/Moderate complexity    Rehab Potential Good    PT Frequency 2x / week    PT Duration 8 weeks    PT Treatment/Interventions ADLs/Self Care Home Management;DME Instruction;Gait training;Stair training;Functional mobility training;Therapeutic activities;Therapeutic exercise;Balance training;Neuromuscular re-education;Patient/family education;Prosthetic Training;Compression bandaging;Passive range of motion;Energy conservation;Iontophoresis 4mg /ml Dexamethasone;Cryotherapy;Aquatic Therapy     PT Next Visit Plan Update HEP as needed based on what she is doing at home already,  Work on her activating knee/feeling comfortable being on prosthesis, balance, endurance    Consulted and Agree with Plan of Care Patient           Patient will benefit from skilled therapeutic intervention in order to improve the following deficits and impairments:  Abnormal gait,Decreased activity tolerance,Decreased balance,Decreased endurance,Decreased knowledge of precautions,Decreased knowledge of use of DME,Decreased mobility,Decreased range of motion,Decreased strength,Difficulty walking,Increased edema,Impaired perceived functional ability,Impaired flexibility,Impaired sensation,Postural dysfunction,Improper body mechanics,Prosthetic Dependency,Pain  Visit Diagnosis: Unsteadiness on feet  Abnormal posture  Other abnormalities of gait and mobility  Hip stiffness, left  Repeated falls     Problem List Patient Active Problem List   Diagnosis Date Noted  . Cough 05/26/2020  . Chronic pain syndrome 01/16/2020  . Hx of AKA (above knee amputation), left ([place])   . Symptomatic anemia 10/21/2019  . Anemia 10/21/2019  . Acute respiratory failure with hypoxia ([place]) 10/21/2019  . Blurry vision, right eye 10/08/2019  . Tobacco use 03/01/2019  . Phantom limb pain ([place]) 11/08/2018  . Current moderate episode of major depressive disorder ([place])   . Hyperkalemia   . Anemia of chronic disease   . Severe protein-calorie malnutrition ([place])   . Fall   . Status post below knee amputation, left ([place]) 04/11/2017  . Bipolar affective disorder ([place])   . Heart failure with preserved ejection fraction ([place]) 02/09/2017  . Routine adult health maintenance 12/08/2016  . Chronic pain of right knee 09/08/2016  . Chronic kidney disease (CKD), stage III (moderate) ([place]) 07/15/2016  . OSA (obstructive sleep apnea) 05/13/2016  . Vaginal irritation 03/14/2016  . Morbid obesity due to excess calories ([place])  11/27/2015  . Subclinical hypothyroidism 04/25/2013  . Controlled type 2 diabetes mellitus ([place]) 07/12/2012  . Carpal tunnel syndrome, bilateral 01/25/2012  . Diabetic polyneuropathy associated with type 2 diabetes mellitus ([place]) 07/04/2011  . Anxiety and depression 06/02/2011  . GERD 12/06/2007  . Hyperlipidemia 08/29/2007  . Hypertension associated with diabetes ([place]) 08/29/2007    Willow Ora, PTA, [facility] [address] [place], [place] [address] [phone] 09/24/20, 1:41 PM   Name: [name] [name] MRN: [id number] Date of Birth: [birth year]

## 2020-09-25 DIAGNOSIS — Z89512 Acquired absence of left leg below knee: Secondary | ICD-10-CM | POA: Diagnosis not present

## 2020-09-28 ENCOUNTER — Encounter: Payer: Self-pay | Admitting: Rehabilitation

## 2020-09-28 ENCOUNTER — Ambulatory Visit: Payer: Medicare HMO | Admitting: Rehabilitation

## 2020-09-28 ENCOUNTER — Other Ambulatory Visit: Payer: Self-pay

## 2020-09-28 DIAGNOSIS — R2689 Other abnormalities of gait and mobility: Secondary | ICD-10-CM | POA: Diagnosis not present

## 2020-09-28 DIAGNOSIS — R293 Abnormal posture: Secondary | ICD-10-CM

## 2020-09-28 DIAGNOSIS — R296 Repeated falls: Secondary | ICD-10-CM

## 2020-09-28 DIAGNOSIS — R2681 Unsteadiness on feet: Secondary | ICD-10-CM

## 2020-09-28 DIAGNOSIS — M25652 Stiffness of left hip, not elsewhere classified: Secondary | ICD-10-CM | POA: Diagnosis not present

## 2020-09-28 DIAGNOSIS — Z7689 Persons encountering health services in other specified circumstances: Secondary | ICD-10-CM | POA: Diagnosis not present

## 2020-09-28 NOTE — Patient Instructions (Signed)
  Aquatic Therapy: What to Expect!  Where:  [facility] [address] [place], [place]  [address] [phone]  NOTE:  You will receive an automated phone message reminding you of your appointment and it will say the appointment is at the Rehab [facility] on [address]  We are working to fix this- just know that you will meet us at the pool!  How to Prepare: . Please make sure you drink 8 ounces of water about one hour prior to your pool session . A caregiver MUST attend the entire session with the patient.  The caregiver will be responsible for assisting with dressing as well as any toileting needs.  If the patient will be doing a home program this should likely be the person who will assist as well.  . Patients must wear either their street shoes or pool shoes until they are ready to enter the pool with the therapist.  Patients must also wear either street shoes or pool shoes once exiting the pool to walk to the locker room.  This will helps us prevent slips and falls.  . Please arrive 15 minutes early to prepare for your pool therapy session . Sign in at the front desk on the clipboard marked for [place] . You may use the locker rooms on your right and then enter directly into the recreation pool (NOT the competition pool) . Please make sure to attend to any toileting needs prior to entering the pool . Please be dressed in your swim suit and on the pool deck at least 5 minutes before your appointment . Once on the pool deck your therapist will ask you to sign the Patient  Consent and Assignment of Benefits form . Your therapist may take your blood pressure prior to, during and after your session if indicated  About the pool  and parking: 1. Entering the pool Your therapist will assist you; there are 2 ways to enter:  stairs with railings or with a chair lift.   Your therapist will determine the most appropriate way for you. 2. Water temperature is usually  between 86-87 degrees 3. There may be other swimmers in the pool at the same time 4. Parking is free.   Contact Info:     Appointments: [facility]  All sessions are 45 minutes   [address]  [address]     Please call the [facility] if   [place], [place]   [address]    you need to cancel or reschedule an appointment.  [phone]       

## 2020-09-28 NOTE — Therapy (Signed)
[facility] [address] [place], [place], [address] Phone: [phone]   Fax:  [phone]  Physical Therapy Treatment  Patient Details  Name: [name] [name] MRN: [id number] Date of Birth: 1975-07-05 Referring Provider (PT): Meridee Score, MD   Encounter Date: 09/28/2020   PT End of Session - 09/28/20 1034    Visit Number 6    Number of Visits 17   per updated POC   Date for PT Re-Evaluation 10/25/20   updated POC   Authorization Type [employer] [place] and Medicaid (16 visits approved from 4/6-10/25/20))    Authorization - Visit Number 6    Authorization - Number of Visits 16    Progress Note Due on Visit 10    PT Start Time 1010    PT Stop Time 1058    PT Time Calculation (min) 48 min    Equipment Utilized During Treatment Gait belt    Activity Tolerance Patient tolerated treatment well;No increased pain;Patient limited by fatigue    Behavior During Therapy [facility] for tasks assessed/performed           Past Medical History:  Diagnosis Date  . Abdominal muscle pain 09/08/2016  . Abnormal Pap smear of cervix 2009  . Abscess    history of multiple abscesses  . Acute bilateral low back pain 02/13/2017  . Acute blood loss anemia   . Acute on chronic renal failure ([place]) 07/12/2012  . Acute renal failure ([place]) 07/12/2012  . Adjustment disorder with depressed mood 03/26/2017  . AKI (acute kidney injury) ([place])   . Anemia of chronic disease 2002  . Anxiety    Panic attacks  . Bilateral lower extremity edema 05/13/2016  . Bipolar disorder ([place])   . Cataract    B/L cataract  . Cellulitis 05/21/2014   right eye  . Chronic bronchitis ([place])    "get it q yr" (05/13/2013)  . Chronic pain   . Dehiscence of amputation stump ([place])   . Depression   . Edema of lower extremity   . Endocarditis 2002   subacute bacterial endocarditis.   . Family history of anesthesia complication    "my mom has a hard time coming out from under"   . Fibromyalgia   . GERD (gastroesophageal reflux disease)    occ  . Heart murmur   . Herpes simplex type 1 infection 01/16/2018  . History of blood transfusion    "just low blood count" (05/13/2013)  . Hyperlipidemia   . Hypertension   . Hypothyroidism   . Hypothyroidism, adult 03/21/2014  . Hypoxia   . Leukocytosis   . Necrosis ([place])    and ulceration  . Necrotizing fasciitis s/p OR debridements 07/06/2012  . Obesity   . OSA on CPAP    does not wear all the time  . Peripheral neuropathy   . Pneumonia   . Type II diabetes mellitus (HCC)    Type  II  . Wound dehiscence 09/04/2019    Past Surgical History:  Procedure Laterality Date  . ABDOMINAL AORTOGRAM W/LOWER EXTREMITY Left 03/19/2019   Procedure: ABDOMINAL AORTOGRAM W/LOWER EXTREMITY;  Surgeon: Serafina Mitchell, MD;  Location: [facility];  Service: Cardiovascular;  Laterality: Left;  . ABOVE KNEE LEG AMPUTATION Left 10/11/2019   wound dehisence   . AIR/FLUID EXCHANGE Right 12/31/2019   Procedure: AIR/FLUID EXCHANGE;  Surgeon: Jalene Mullet, MD;  Location: [facility];  Service: Ophthalmology;  Laterality: Right;  . AMPUTATION Left 04/07/2017   Procedure:  LEFT BELOW KNEE AMPUTATION;  Surgeon: Newt Minion, MD;  Location: [facility];  Service: Orthopedics;  Laterality: Left;  . AMPUTATION Left 10/11/2019   Procedure: LEFT ABOVE KNEE AMPUTATION;  Surgeon: Newt Minion, MD;  Location: [facility];  Service: Orthopedics;  Laterality: Left;  . CATARACT EXTRACTION W/PHACO Right 02/11/2020   Procedure: CATARACT EXTRACTION PHACO AND INTRAOCULAR LENS PLACEMENT (IOC);  Surgeon: Jalene Mullet, MD;  Location: [facility];  Service: Ophthalmology;  Laterality: Right;  . CORONARY ARTERY BYPASS GRAFT    . EYE SURGERY     lazer  . GAS INSERTION Right 12/31/2019   Procedure: INSERTION OF GAS;  Surgeon: Jalene Mullet, MD;  Location: [facility];  Service: Ophthalmology;  Laterality: Right;  . INCISION AND DRAINAGE ABSCESS     multiple I&Ds  . INCISION  AND DRAINAGE ABSCESS Left 07/09/2012   Procedure: DRESSING CHANGE, THIGH WOUND;  Surgeon: Harl Bowie, MD;  Location: [facility];  Service: General;  Laterality: Left;  . INCISION AND DRAINAGE OF WOUND Left 07/07/2012   Procedure: IRRIGATION AND DEBRIDEMENT WOUND;  Surgeon: Harl Bowie, MD;  Location: [facility];  Service: General;  Laterality: Left;  . INCISION AND DRAINAGE PERIRECTAL ABSCESS Left 07/14/2012   Procedure: DEBRIDEMENT OF SKIN & SOFT TISSUE; DRESSING CHANGE UNDER ANESTHESIA;  Surgeon: Gayland Curry, MD,FACS;  Location: [facility];  Service: General;  Laterality: Left;  . INCISION AND DRAINAGE PERIRECTAL ABSCESS Left 07/16/2012   Procedure: I&D Left Thigh;  Surgeon: Gwenyth Ober, MD;  Location: [facility];  Service: General;  Laterality: Left;  . INCISION AND DRAINAGE PERIRECTAL ABSCESS N/A 01/05/2015   Procedure: IRRIGATION AND DEBRIDEMENT PERIRECTAL ABSCESS;  Surgeon: Donnie Mesa, MD;  Location: [facility];  Service: General;  Laterality: N/A;  . IRRIGATION AND DEBRIDEMENT ABSCESS Left 07/06/2012   Procedure: IRRIGATION AND DEBRIDEMENT ABSCESS BUTTOCKS AND THIGH;  Surgeon: Shann Medal, MD;  Location: [facility];  Service: General;  Laterality: Left;  . IRRIGATION AND DEBRIDEMENT ABSCESS Left 08/10/2012   Procedure: IRRIGATION AND DEBRIDEMENT ABSCESS;  Surgeon: Madilyn Hook, DO;  Location: [facility];  Service: General;  Laterality: Left;  . PARS PLANA VITRECTOMY Right 12/31/2019   Procedure: PARS PLANA VITRECTOMY WITH 25 GAUGE;  Surgeon: Jalene Mullet, MD;  Location: [facility];  Service: Ophthalmology;  Laterality: Right;  . PHOTOCOAGULATION WITH LASER Right 12/31/2019   Procedure: PHOTOCOAGULATION WITH LASER;  Surgeon: Jalene Mullet, MD;  Location: [facility];  Service: Ophthalmology;  Laterality: Right;  . REPAIR OF COMPLEX TRACTION RETINAL DETACHMENT Right 12/31/2019   Procedure: REPAIR OF COMPLEX TRACTION RETINAL;  Surgeon: Jalene Mullet, MD;  Location: [facility];  Service: Ophthalmology;  Laterality: Right;   . STUMP REVISION Left 06/09/2017   Procedure: REVISION LEFT BELOW KNEE AMPUTATION;  Surgeon: Newt Minion, MD;  Location: [facility];  Service: Orthopedics;  Laterality: Left;  . STUMP REVISION Left 09/04/2019   Procedure: LEFT BELOW KNEE AMPUTATION REVISION;  Surgeon: Newt Minion, MD;  Location: [facility];  Service: Orthopedics;  Laterality: Left;  . STUMP REVISION Left 09/20/2019   Procedure: LEFT BELOW KNEE AMPUTATION REVISION;  Surgeon: Newt Minion, MD;  Location: [facility];  Service: Orthopedics;  Laterality: Left;  . TONSILLECTOMY  1994    There were no vitals filed for this visit.   Subjective Assessment - 09/28/20 1013    Subjective Pt wearing 13 ply socks today.  Pain is manageable, took meds    Limitations Walking;House hold activities;Standing    Patient Stated  Goals "I want to be able to walk on this leg and move around"    Currently in Pain? Yes    Pain Score 5     Pain Location Knee    Pain Orientation Right    Pain Descriptors / Indicators Aching;Sore    Pain Type Chronic pain    Pain Onset More than a month ago    Pain Frequency Constant    Aggravating Factors  OA, weather, increased standing/walking    Pain Relieving Factors rest, medication                             [facility] Adult PT Treatment/Exercise - 09/28/20 1048      Transfers   Transfers Sit to Stand;Stand to Sit    Sit to Stand 6: Modified independent (Device/Increase time)    Stand to Sit 6: Modified independent (Device/Increase time)      Ambulation/Gait   Ambulation/Gait Yes    Ambulation/Gait Assistance 6: Modified independent (Device/Increase time);5: Supervision    Ambulation/Gait Assistance Details She is safe to ambulate at mod I level, however she does still need intermittent cues for more upright posture and closeness to RW in order to improve prosthetic clearance during swing.  Note that she does seem longer on prosthetic side today due to having so many socks donned.  PT  encouraged her to call Gerald Stabs at [facility] to see if he would be willing to go ahead and add some padding to socket.    Ambulation Distance (Feet) 300 Feet    Assistive device Rolling walker;Prosthesis    Gait Pattern Step-through pattern;Decreased stride length;Decreased stance time - left;Decreased step length - right;Decreased trunk rotation;Trunk flexed;Wide base of support    Ambulation Surface Level;Indoor    Gait velocity 1.60 ft/sec with RW (1.36 ft/sec when cued for more knee flex on prosthesis)      Dynamic Standing Balance   Dynamic Standing - Level of Assistance 5: Stand by assistance    Dynamic Standing - Balance Activities Lateral lean/weight shifting;Forward lean/weight shifting;Reaching for objects;Reaching across midline    Reaching for objects comments: Performed several standing tasks with intermittent UE to no UE support during session along with gait in order to achieve 10 min standing goal.  She actually was able to remain standing x 11 mins      Prosthetics   Prosthetic Care Comments  Pt wearing 13 ply socks today.  Educated to call chris from [facility] to see if he would add padding. Also discussed aquatic therapy and have her set up to go to [place] on 5/18 with Vinnie [facility].  She will arrive in w/c as it is long walk to get to pool area.    Current prosthetic wear tolerance (days/week)  daily     Current prosthetic wear tolerance (#hours/day)  4 hrs, 2x/day consistently, educated to increase to 5hrs, 2x/day    Current prosthetic weight-bearing tolerance (hours/day)  11 mins    Edema improved today    Residual limb condition  Pt reports no skin issues    Education Provided Other (comment)   contacting [employer]   Person(s) Educated Patient    Education Method Explanation;Verbal cues    Education Method Verbalized understanding    Donning Prosthesis Modified independent (device/increased time)                    PT Short Term Goals - 09/28/20 1014      PT  SHORT TERM  GOAL #1   Title Pt will be independent with ongoing standing HEP with prosthesis.   (Target Date: 09/25/20)    Baseline met per pt reporting    Time 4    Period Weeks    Status Achieved    Target Date 09/25/20      PT SHORT TERM GOAL #2   Title Pt will be able to tolerate wearing prosthesis 5 hrs, 2x/day with no skin issues and with good understanding of swelling management.    Baseline wearing 4 hrs, 2x/day consistently, educated to increase to 5hrs, 2x/day    Time 4    Period Weeks    Status Partially Met      PT SHORT TERM GOAL #3   Title Pt will  perform dynamic tasks with intermittent UE support x 10 mins at S level and reach to floor level to retrieve item.    Baseline Stood/ambulated x 11 mins total performing multiple standing balance tasks and reaching to floor with single UE support to retrieve item.    Time 4    Period Weeks    Status Achieved      PT SHORT TERM GOAL #4   Title Pt will ambulate x 300' with RW at mod I level without rest break in order to indicate improved activity tolerance.    Baseline met at mostly mod I level, min cues for upright posture    Time 4    Period Weeks    Status Partially Met      PT SHORT TERM GOAL #5   Title Pt will improve gait speed to >/=1.44 ft/sec w/ RW and prosthesis to indicate dec fall risk.    Baseline 1.60 ft/sec with RW and prosthesis    Time 4    Period Weeks    Status Achieved             PT Long Term Goals - 08/26/20 1155      PT LONG TERM GOAL #1   Title Pt will be independent with final HEP in order to indicate improved functional mobility and balance with use of prosthesis.  (Target Date: 10/25/20)    Time 8    Period Weeks    Status New      PT LONG TERM GOAL #2   Title Pt will be able to tolerate wearing for 90% of awake hours without skin breakdown and with good understanding of swelling mangement.    Baseline 4hrs, 2x/day, still having some swelling    Time 8    Period Weeks    Status New      PT  LONG TERM GOAL #3   Title Pt will tolerate standing x 15 mins while performing tasks intermittently without UE support and pick up item from floor with single UE support at mod I level.    Baseline able to stand 5 mins at a time    Time 8    Period Weeks    Status New      PT LONG TERM GOAL #4   Title Pt will improve gait speed to >/=2.00 ft/sec with LRAD and prosthesis to indicate dec fall risk.    Baseline .84 ft/sec with wide RW    Time 8    Period Weeks    Status New      PT LONG TERM GOAL #5   Title Pt will ambulate x 25' w/ SBQC (or LRAD) and prosthesis at min A level in order to  indicate improved independence with gait.    Baseline 10' at min A level    Time 8    Period Weeks    Status New      Additional Long Term Goals   Additional Long Term Goals Yes      PT LONG TERM GOAL #6   Title Pt will negotiate up/down 3 steps with single rail and crutch (or LRAD) at S level in order to enter/exit home safely.    Time 8    Period Weeks    Status New                 Plan - 09/28/20 1303    Clinical Impression Statement Skilled session focused on STG assessment.  Pt has met 3/5 STGs, partially meeting wear time goal as she is more consistently wearing and partially meeting gait goal as she does still need intermittent cuing for technique.  Pt is making good progress towards LTGs and will benefit from continued PT to address remaining deficits.    Personal Factors and Comorbidities Comorbidity 3+;Past/Current Experience;Time since onset of injury/illness/exacerbation    Comorbidities see above    Examination-Activity Limitations Bend;Carry;Dressing;Stand;Stairs;Squat;Locomotion Level;Transfers    Examination-Participation Restrictions Community Activity;Meal Prep;Shop;Laundry    Stability/Clinical Decision Making Evolving/Moderate complexity    Rehab Potential Good    PT Frequency 2x / week    PT Duration 8 weeks    PT Treatment/Interventions ADLs/Self Care Home  Management;DME Instruction;Gait training;Stair training;Functional mobility training;Therapeutic activities;Therapeutic exercise;Balance training;Neuromuscular re-education;Patient/family education;Prosthetic Training;Compression bandaging;Passive range of motion;Energy conservation;Iontophoresis 43m/ml Dexamethasone;Cryotherapy;Aquatic Therapy    PT Next Visit Plan Update HEP as needed based on what she is doing at home already,  Work on her activating knee/feeling comfortable being on prosthesis, balance, endurance    PT Home Exercise Plan We discussed doing aquatic PT 1x/wk for a month (4 weeks) and recerting for another 4-6 weeks with her then working at home on endurance and strength to return in about sept/oct to work on getting on cane.  She is good with this plan.    Consulted and Agree with Plan of Care Patient           Patient will benefit from skilled therapeutic intervention in order to improve the following deficits and impairments:  Abnormal gait,Decreased activity tolerance,Decreased balance,Decreased endurance,Decreased knowledge of precautions,Decreased knowledge of use of DME,Decreased mobility,Decreased range of motion,Decreased strength,Difficulty walking,Increased edema,Impaired perceived functional ability,Impaired flexibility,Impaired sensation,Postural dysfunction,Improper body mechanics,Prosthetic Dependency,Pain  Visit Diagnosis: Unsteadiness on feet  Abnormal posture  Other abnormalities of gait and mobility  Hip stiffness, left  Repeated falls     Problem List Patient Active Problem List   Diagnosis Date Noted  . Cough 05/26/2020  . Chronic pain syndrome 01/16/2020  . Hx of AKA (above knee amputation), left ([place]   . Symptomatic anemia 10/21/2019  . Anemia 10/21/2019  . Acute respiratory failure with hypoxia ([facility] 10/21/2019  . Blurry vision, right eye 10/08/2019  . Tobacco use 03/01/2019  . Phantom limb pain ([facility] 11/08/2018  . Current moderate  episode of major depressive disorder ([place] [facility]   . Hyperkalemia   . Anemia of chronic disease   . Severe protein-calorie malnutrition ([facility]   . Fall   . Status post below knee amputation, left ([facility] 04/11/2017  . Bipolar affective disorder ([place]   . Heart failure with preserved ejection fraction ([place] 02/09/2017  . Routine adult health maintenance 12/08/2016  . Chronic pain of right knee 09/08/2016  . Chronic kidney disease (CKD), stage  III (moderate) ([place]) 07/15/2016  . OSA (obstructive sleep apnea) 05/13/2016  . Vaginal irritation 03/14/2016  . Morbid obesity due to excess calories ([place]) 11/27/2015  . Subclinical hypothyroidism 04/25/2013  . Controlled type 2 diabetes mellitus ([place]) 07/12/2012  . Carpal tunnel syndrome, bilateral 01/25/2012  . Diabetic polyneuropathy associated with type 2 diabetes mellitus ([place]) 07/04/2011  . Anxiety and depression 06/02/2011  . GERD 12/06/2007  . Hyperlipidemia 08/29/2007  . Hypertension associated with diabetes ([place]) 08/29/2007    Cameron Sprang, PT, MPT [facility] [address] [place], [place], [address] Phone: [phone]   Fax:  [phone] 09/28/20, 1:09 PM  Name: [name] [name] MRN: [id number] Date of Birth: 11/01/1975

## 2020-09-30 ENCOUNTER — Ambulatory Visit: Payer: Medicare HMO | Admitting: Physical Therapy

## 2020-09-30 ENCOUNTER — Other Ambulatory Visit: Payer: Self-pay

## 2020-09-30 ENCOUNTER — Encounter: Payer: Self-pay | Admitting: Physical Therapy

## 2020-09-30 DIAGNOSIS — M25652 Stiffness of left hip, not elsewhere classified: Secondary | ICD-10-CM

## 2020-09-30 DIAGNOSIS — R2681 Unsteadiness on feet: Secondary | ICD-10-CM

## 2020-09-30 DIAGNOSIS — R2689 Other abnormalities of gait and mobility: Secondary | ICD-10-CM

## 2020-09-30 DIAGNOSIS — R293 Abnormal posture: Secondary | ICD-10-CM | POA: Diagnosis not present

## 2020-09-30 DIAGNOSIS — R296 Repeated falls: Secondary | ICD-10-CM

## 2020-09-30 DIAGNOSIS — Z7689 Persons encountering health services in other specified circumstances: Secondary | ICD-10-CM | POA: Diagnosis not present

## 2020-09-30 NOTE — Therapy (Signed)
[facility] [address] [place], [place], [address] Phone: [phone]   Fax:  [phone]  Physical Therapy Treatment  Patient Details  Name: [name] [name] MRN: [id number] Date of Birth: 01/29/1976 Referring Provider (PT): Meridee Score, MD   Encounter Date: 09/30/2020   PT End of Session - 09/30/20 1038    Visit Number 7    Number of Visits 17   per updated POC   Date for PT Re-Evaluation 10/25/20   updated POC   Authorization Type [employer] [place] and Medicaid (16 visits approved from 4/6-10/25/20))    Authorization - Visit Number 7    Authorization - Number of Visits 16    Progress Note Due on Visit 10    PT Start Time 1018    PT Stop Time 1100    PT Time Calculation (min) 42 min    Equipment Utilized During Treatment Gait belt    Activity Tolerance Patient tolerated treatment well;No increased pain;Patient limited by fatigue    Behavior During Therapy [facility] for tasks assessed/performed           Past Medical History:  Diagnosis Date  . Abdominal muscle pain 09/08/2016  . Abnormal Pap smear of cervix 2009  . Abscess    history of multiple abscesses  . Acute bilateral low back pain 02/13/2017  . Acute blood loss anemia   . Acute on chronic renal failure ([place]) 07/12/2012  . Acute renal failure ([place]) 07/12/2012  . Adjustment disorder with depressed mood 03/26/2017  . AKI (acute kidney injury) ([place])   . Anemia of chronic disease 2002  . Anxiety    Panic attacks  . Bilateral lower extremity edema 05/13/2016  . Bipolar disorder ([place])   . Cataract    B/L cataract  . Cellulitis 05/21/2014   right eye  . Chronic bronchitis ([place])    "get it q yr" (05/13/2013)  . Chronic pain   . Dehiscence of amputation stump ([place])   . Depression   . Edema of lower extremity   . Endocarditis 2002   subacute bacterial endocarditis.   . Family history of anesthesia complication    "my mom has a hard time coming out from under"   . Fibromyalgia   . GERD (gastroesophageal reflux disease)    occ  . Heart murmur   . Herpes simplex type 1 infection 01/16/2018  . History of blood transfusion    "just low blood count" (05/13/2013)  . Hyperlipidemia   . Hypertension   . Hypothyroidism   . Hypothyroidism, adult 03/21/2014  . Hypoxia   . Leukocytosis   . Necrosis ([place])    and ulceration  . Necrotizing fasciitis s/p OR debridements 07/06/2012  . Obesity   . OSA on CPAP    does not wear all the time  . Peripheral neuropathy   . Pneumonia   . Type II diabetes mellitus (HCC)    Type  II  . Wound dehiscence 09/04/2019    Past Surgical History:  Procedure Laterality Date  . ABDOMINAL AORTOGRAM W/LOWER EXTREMITY Left 03/19/2019   Procedure: ABDOMINAL AORTOGRAM W/LOWER EXTREMITY;  Surgeon: Serafina Mitchell, MD;  Location: [facility];  Service: Cardiovascular;  Laterality: Left;  . ABOVE KNEE LEG AMPUTATION Left 10/11/2019   wound dehisence   . AIR/FLUID EXCHANGE Right 12/31/2019   Procedure: AIR/FLUID EXCHANGE;  Surgeon: Jalene Mullet, MD;  Location: [facility];  Service: Ophthalmology;  Laterality: Right;  . AMPUTATION Left 04/07/2017   Procedure:  LEFT BELOW KNEE AMPUTATION;  Surgeon: Newt Minion, MD;  Location: [facility];  Service: Orthopedics;  Laterality: Left;  . AMPUTATION Left 10/11/2019   Procedure: LEFT ABOVE KNEE AMPUTATION;  Surgeon: Newt Minion, MD;  Location: [facility];  Service: Orthopedics;  Laterality: Left;  . CATARACT EXTRACTION W/PHACO Right 02/11/2020   Procedure: CATARACT EXTRACTION PHACO AND INTRAOCULAR LENS PLACEMENT (IOC);  Surgeon: Jalene Mullet, MD;  Location: [facility];  Service: Ophthalmology;  Laterality: Right;  . CORONARY ARTERY BYPASS GRAFT    . EYE SURGERY     lazer  . GAS INSERTION Right 12/31/2019   Procedure: INSERTION OF GAS;  Surgeon: Jalene Mullet, MD;  Location: [facility];  Service: Ophthalmology;  Laterality: Right;  . INCISION AND DRAINAGE ABSCESS     multiple I&Ds  . INCISION  AND DRAINAGE ABSCESS Left 07/09/2012   Procedure: DRESSING CHANGE, THIGH WOUND;  Surgeon: Harl Bowie, MD;  Location: [facility];  Service: General;  Laterality: Left;  . INCISION AND DRAINAGE OF WOUND Left 07/07/2012   Procedure: IRRIGATION AND DEBRIDEMENT WOUND;  Surgeon: Harl Bowie, MD;  Location: [facility];  Service: General;  Laterality: Left;  . INCISION AND DRAINAGE PERIRECTAL ABSCESS Left 07/14/2012   Procedure: DEBRIDEMENT OF SKIN & SOFT TISSUE; DRESSING CHANGE UNDER ANESTHESIA;  Surgeon: Gayland Curry, MD,FACS;  Location: [facility];  Service: General;  Laterality: Left;  . INCISION AND DRAINAGE PERIRECTAL ABSCESS Left 07/16/2012   Procedure: I&D Left Thigh;  Surgeon: Gwenyth Ober, MD;  Location: [facility];  Service: General;  Laterality: Left;  . INCISION AND DRAINAGE PERIRECTAL ABSCESS N/A 01/05/2015   Procedure: IRRIGATION AND DEBRIDEMENT PERIRECTAL ABSCESS;  Surgeon: Donnie Mesa, MD;  Location: [facility];  Service: General;  Laterality: N/A;  . IRRIGATION AND DEBRIDEMENT ABSCESS Left 07/06/2012   Procedure: IRRIGATION AND DEBRIDEMENT ABSCESS BUTTOCKS AND THIGH;  Surgeon: Shann Medal, MD;  Location: [facility];  Service: General;  Laterality: Left;  . IRRIGATION AND DEBRIDEMENT ABSCESS Left 08/10/2012   Procedure: IRRIGATION AND DEBRIDEMENT ABSCESS;  Surgeon: Madilyn Hook, DO;  Location: [facility];  Service: General;  Laterality: Left;  . PARS PLANA VITRECTOMY Right 12/31/2019   Procedure: PARS PLANA VITRECTOMY WITH 25 GAUGE;  Surgeon: Jalene Mullet, MD;  Location: [facility];  Service: Ophthalmology;  Laterality: Right;  . PHOTOCOAGULATION WITH LASER Right 12/31/2019   Procedure: PHOTOCOAGULATION WITH LASER;  Surgeon: Jalene Mullet, MD;  Location: [facility];  Service: Ophthalmology;  Laterality: Right;  . REPAIR OF COMPLEX TRACTION RETINAL DETACHMENT Right 12/31/2019   Procedure: REPAIR OF COMPLEX TRACTION RETINAL;  Surgeon: Jalene Mullet, MD;  Location: [facility];  Service: Ophthalmology;  Laterality: Right;   . STUMP REVISION Left 06/09/2017   Procedure: REVISION LEFT BELOW KNEE AMPUTATION;  Surgeon: Newt Minion, MD;  Location: [facility];  Service: Orthopedics;  Laterality: Left;  . STUMP REVISION Left 09/04/2019   Procedure: LEFT BELOW KNEE AMPUTATION REVISION;  Surgeon: Newt Minion, MD;  Location: [facility];  Service: Orthopedics;  Laterality: Left;  . STUMP REVISION Left 09/20/2019   Procedure: LEFT BELOW KNEE AMPUTATION REVISION;  Surgeon: Newt Minion, MD;  Location: [facility];  Service: Orthopedics;  Laterality: Left;  . TONSILLECTOMY  1994    There were no vitals filed for this visit.   Subjective Assessment - 09/30/20 1027    Subjective No new complaints. No falls to report. Pt noted to be internally rotated with prosthesis on arrival. Also noted to be tall on prosthetic  side as well. Brought in video of how she goes up her porch. Feels her [place] Flakes is flared up today due to pain all over.    Limitations Walking;House hold activities;Standing    How long can you stand comfortably? 5 mins    How long can you walk comfortably? 5 mins    Patient Stated Goals "I want to be able to walk on this leg and move around"    Currently in Pain? Yes    Pain Score 5     Pain Location Generalized    Pain Descriptors / Indicators Aching;Sore;Radiating;Heaviness   "feels like someone is standing on my shoulders"   Pain Type Chronic pain;Neuropathic pain    Pain Onset More than a month ago    Pain Frequency Constant    Aggravating Factors  OA, weather, increased standing/walking, Fibromyalgia flareup    Pain Relieving Factors rest, medication               [facility] Adult PT Treatment/Exercise - 09/30/20 1038      Transfers   Transfers Sit to Stand;Stand to Sit    Sit to Stand 6: Modified independent (Device/Increase time)    Stand to Sit 6: Modified independent (Device/Increase time)      Ambulation/Gait   Ambulation/Gait Yes    Ambulation/Gait Assistance 6: Modified independent  (Device/Increase time);5: Supervision    Ambulation/Gait Assistance Details around gym with session    Assistive device Rolling walker;Prosthesis    Gait Pattern Step-through pattern;Decreased stride length;Decreased stance time - left;Decreased step length - right;Decreased trunk rotation;Trunk flexed;Wide base of support    Ambulation Surface Level;Indoor    Stairs Yes    Stairs Assistance 4: Min guard    Stairs Assistance Details (indicate cue type and reason) single step for several reps in parallel bars- x4 with bil rails, then 4 reps with single rail/forearm crutch x 2 reps with right rail, x2 reps with left rail. cues on posture and sequencing needed.    Stair Management Technique Two rails;One rail Left;One rail Right;Forwards;With crutches    Number of Stairs 1    Height of Stairs 6    Gait Comments pt's video shows how she sits on the side of the porch from the ground, then rotates herself around so that her legs are across the steps, then lifts herself up backwards into the wheelchair for the porch. She reverses this process to get back down the stairs when leaving the house. Disicssed getting a rail or bil rails installed. Pt has order from MD that she has turned into her property manager over a year ago. Pt advised to follow up on this status and possibly go as far as the property management company if not getting anywhere with Secondary school teacher. Discussed safety risk with pt not having a direct, safe route to enter/exit her home if an emergency such as a fire should occur. Pt verbalized understanding of this.      Prosthetics   Prosthetic Care Comments  Pt with up to 13 ply socks on. Noted to be rotated internally on entry into gym. In private room pt adjusted her socks that has slid down. Pt shown to fold to part of socks over socket to assist with them staying in place. Reinforced calling prosthetist, Gerald Stabs, about getting pads added to socket. Pt verbalized understanding.    Current  prosthetic wear tolerance (days/week)  daily     Current prosthetic wear tolerance (#hours/day)  4-5 hours 2x a day  Residual limb condition  Pt reports no skin issues    Education Provided Other (comment);Residual limb care;Correct ply sock adjustment;Proper weight-bearing schedule/adjustment   see care comments above   Person(s) Educated Patient    Education Method Explanation;Demonstration;Verbal cues    Education Method Verbalized understanding;Returned demonstration;Verbal cues required;Needs further instruction    Donning Prosthesis Modified independent (device/increased time)                PT Short Term Goals - 09/28/20 1014      PT SHORT TERM GOAL #1   Title Pt will be independent with ongoing standing HEP with prosthesis.   (Target Date: 09/25/20)    Baseline met per pt reporting    Time 4    Period Weeks    Status Achieved    Target Date 09/25/20      PT SHORT TERM GOAL #2   Title Pt will be able to tolerate wearing prosthesis 5 hrs, 2x/day with no skin issues and with good understanding of swelling management.    Baseline wearing 4 hrs, 2x/day consistently, educated to increase to 5hrs, 2x/day    Time 4    Period Weeks    Status Partially Met      PT SHORT TERM GOAL #3   Title Pt will  perform dynamic tasks with intermittent UE support x 10 mins at S level and reach to floor level to retrieve item.    Baseline Stood/ambulated x 11 mins total performing multiple standing balance tasks and reaching to floor with single UE support to retrieve item.    Time 4    Period Weeks    Status Achieved      PT SHORT TERM GOAL #4   Title Pt will ambulate x 300' with RW at mod I level without rest break in order to indicate improved activity tolerance.    Baseline met at mostly mod I level, min cues for upright posture    Time 4    Period Weeks    Status Partially Met      PT SHORT TERM GOAL #5   Title Pt will improve gait speed to >/=1.44 ft/sec w/ RW and prosthesis to  indicate dec fall risk.    Baseline 1.60 ft/sec with RW and prosthesis    Time 4    Period Weeks    Status Achieved             PT Long Term Goals - 08/26/20 1155      PT LONG TERM GOAL #1   Title Pt will be independent with final HEP in order to indicate improved functional mobility and balance with use of prosthesis.  (Target Date: 10/25/20)    Time 8    Period Weeks    Status New      PT LONG TERM GOAL #2   Title Pt will be able to tolerate wearing for 90% of awake hours without skin breakdown and with good understanding of swelling mangement.    Baseline 4hrs, 2x/day, still having some swelling    Time 8    Period Weeks    Status New      PT LONG TERM GOAL #3   Title Pt will tolerate standing x 15 mins while performing tasks intermittently without UE support and pick up item from floor with single UE support at mod I level.    Baseline able to stand 5 mins at a time    Time 8    Period Weeks  Status New      PT LONG TERM GOAL #4   Title Pt will improve gait speed to >/=2.00 ft/sec with LRAD and prosthesis to indicate dec fall risk.    Baseline .26 ft/sec with wide RW    Time 8    Period Weeks    Status New      PT LONG TERM GOAL #5   Title Pt will ambulate x 25' w/ SBQC (or LRAD) and prosthesis at min A level in order to indicate improved independence with gait.    Baseline 10' at min A level    Time 8    Period Weeks    Status New      Additional Long Term Goals   Additional Long Term Goals Yes      PT LONG TERM GOAL #6   Title Pt will negotiate up/down 3 steps with single rail and crutch (or LRAD) at S level in order to enter/exit home safely.    Time 8    Period Weeks    Status New                 Plan - 09/30/20 1038    Clinical Impression Statement Today's skilled session focused on sock mangement for proper fit/decreased rotation and stair negotiation with introduction of forearm crutch with rail. Pt did well with this combo in parallel  bars, will need further practice in sessions in both parallel bars progressing to stairs before using this technique outside of PT. Pt is to follow up on the process of getting rails installed at her apartment as well. The pt is progressing toward goals and should benefit from continued PT to progress toward unmet goals.    Personal Factors and Comorbidities Comorbidity 3+;Past/Current Experience;Time since onset of injury/illness/exacerbation    Comorbidities see above    Examination-Activity Limitations [place];Carry;Dressing;Stand;Stairs;Squat;Locomotion Level;Transfers    Examination-Participation Restrictions Community Activity;Meal Prep;Shop;Laundry    Stability/Clinical Decision Making Evolving/Moderate complexity    Rehab Potential Good    PT Frequency 2x / week    PT Duration 8 weeks    PT Treatment/Interventions ADLs/Self Care Home Management;DME Instruction;Gait training;Stair training;Functional mobility training;Therapeutic activities;Therapeutic exercise;Balance training;Neuromuscular re-education;Patient/family education;Prosthetic Training;Compression bandaging;Passive range of motion;Energy conservation;Iontophoresis 86m/ml Dexamethasone;Cryotherapy;Aquatic Therapy    PT Next Visit Plan Update HEP as needed based on what she is doing at home already,  Work on her activating knee/feeling comfortable being on prosthesis, balance, endurance    PT Home Exercise Plan We discussed doing aquatic PT 1x/wk for a month (4 weeks) and recerting for another 4-6 weeks with her then working at home on endurance and strength to return in about sept/oct to work on getting on cane.  She is good with this plan.    Consulted and Agree with Plan of Care Patient           Patient will benefit from skilled therapeutic intervention in order to improve the following deficits and impairments:  Abnormal gait,Decreased activity tolerance,Decreased balance,Decreased endurance,Decreased knowledge of  precautions,Decreased knowledge of use of DME,Decreased mobility,Decreased range of motion,Decreased strength,Difficulty walking,Increased edema,Impaired perceived functional ability,Impaired flexibility,Impaired sensation,Postural dysfunction,Improper body mechanics,Prosthetic Dependency,Pain  Visit Diagnosis: Unsteadiness on feet  Abnormal posture  Other abnormalities of gait and mobility  Repeated falls  Hip stiffness, left     Problem List Patient Active Problem List   Diagnosis Date Noted  . Cough 05/26/2020  . Chronic pain syndrome 01/16/2020  . Hx of AKA (above knee amputation), left ([place]   . Symptomatic  anemia 10/21/2019  . Anemia 10/21/2019  . Acute respiratory failure with hypoxia ([place]) 10/21/2019  . Blurry vision, right eye 10/08/2019  . Tobacco use 03/01/2019  . Phantom limb pain ([place]) 11/08/2018  . Current moderate episode of major depressive disorder ([place])   . Hyperkalemia   . Anemia of chronic disease   . Severe protein-calorie malnutrition ([place])   . Fall   . Status post below knee amputation, left ([place]) 04/11/2017  . Bipolar affective disorder ([place])   . Heart failure with preserved ejection fraction ([place]) 02/09/2017  . Routine adult health maintenance 12/08/2016  . Chronic pain of right knee 09/08/2016  . Chronic kidney disease (CKD), stage III (moderate) ([place]) 07/15/2016  . OSA (obstructive sleep apnea) 05/13/2016  . Vaginal irritation 03/14/2016  . Morbid obesity due to excess calories ([place]) 11/27/2015  . Subclinical hypothyroidism 04/25/2013  . Controlled type 2 diabetes mellitus ([place]) 07/12/2012  . Carpal tunnel syndrome, bilateral 01/25/2012  . Diabetic polyneuropathy associated with type 2 diabetes mellitus ([place]) 07/04/2011  . Anxiety and depression 06/02/2011  . GERD 12/06/2007  . Hyperlipidemia 08/29/2007  . Hypertension associated with diabetes ([place]) 08/29/2007    Willow Ora, PTA, [facility] [address] [place], [place] [address] [phone] 09/30/20, 6:59 PM   Name: [name] [name] MRN: [id number] Date of Birth: 05/25/1975

## 2020-10-02 ENCOUNTER — Encounter: Payer: Self-pay | Admitting: Student

## 2020-10-02 ENCOUNTER — Ambulatory Visit (INDEPENDENT_AMBULATORY_CARE_PROVIDER_SITE_OTHER): Payer: Medicare HMO | Admitting: Student

## 2020-10-02 ENCOUNTER — Other Ambulatory Visit: Payer: Self-pay

## 2020-10-02 VITALS — BP 190/89 | HR 92 | Temp 98.1°F | Ht 69.0 in | Wt 294.1 lb

## 2020-10-02 DIAGNOSIS — I152 Hypertension secondary to endocrine disorders: Secondary | ICD-10-CM

## 2020-10-02 DIAGNOSIS — G8929 Other chronic pain: Secondary | ICD-10-CM

## 2020-10-02 DIAGNOSIS — N1832 Chronic kidney disease, stage 3b: Secondary | ICD-10-CM | POA: Diagnosis not present

## 2020-10-02 DIAGNOSIS — F419 Anxiety disorder, unspecified: Secondary | ICD-10-CM

## 2020-10-02 DIAGNOSIS — M25561 Pain in right knee: Secondary | ICD-10-CM | POA: Diagnosis not present

## 2020-10-02 DIAGNOSIS — E038 Other specified hypothyroidism: Secondary | ICD-10-CM

## 2020-10-02 DIAGNOSIS — D649 Anemia, unspecified: Secondary | ICD-10-CM

## 2020-10-02 DIAGNOSIS — E1159 Type 2 diabetes mellitus with other circulatory complications: Secondary | ICD-10-CM

## 2020-10-02 DIAGNOSIS — F32A Depression, unspecified: Secondary | ICD-10-CM

## 2020-10-02 DIAGNOSIS — D638 Anemia in other chronic diseases classified elsewhere: Secondary | ICD-10-CM

## 2020-10-02 LAB — BASIC METABOLIC PANEL
Anion gap: 6 (ref 5–15)
BUN: 44 mg/dL — ABNORMAL HIGH (ref 6–20)
CO2: 22 mmol/L (ref 22–32)
Calcium: 8.6 mg/dL — ABNORMAL LOW (ref 8.9–10.3)
Chloride: 111 mmol/L (ref 98–111)
Creatinine, Ser: 4.29 mg/dL — ABNORMAL HIGH (ref 0.44–1.00)
GFR, Estimated: 12 mL/min — ABNORMAL LOW (ref >60)
Glucose, Bld: 105 mg/dL — ABNORMAL HIGH (ref 70–99)
Potassium: 5.2 mmol/L — ABNORMAL HIGH (ref 3.5–5.1)
Sodium: 139 mmol/L (ref 135–145)

## 2020-10-02 LAB — CBC
HCT: 30.7 % — ABNORMAL LOW (ref 36.0–46.0)
Hemoglobin: 9.2 g/dL — ABNORMAL LOW (ref 12.0–15.0)
MCH: 26 pg (ref 26.0–34.0)
MCHC: 30 g/dL (ref 30.0–36.0)
MCV: 86.7 fL (ref 80.0–100.0)
Platelets: 340 10*3/uL (ref 150–400)
RBC: 3.54 MIL/uL — ABNORMAL LOW (ref 3.87–5.11)
RDW: 14.4 % (ref 11.5–15.5)
WBC: 5.4 10*3/uL (ref 4.0–10.5)
nRBC: 0 % (ref 0.0–0.2)

## 2020-10-02 MED ORDER — METOPROLOL SUCCINATE 12.5 MG HALF TABLET
25.0000 mg | ORAL_TABLET | Freq: Once | ORAL | Status: AC
  Administered 2020-10-02: 25 mg via ORAL

## 2020-10-02 MED ORDER — HYDRALAZINE HCL 10 MG PO TABS
50.0000 mg | ORAL_TABLET | Freq: Once | ORAL | Status: AC
  Administered 2020-10-02: 50 mg via ORAL

## 2020-10-02 MED ORDER — ALLOPURINOL 100 MG PO TABS: 50.0000 mg | ORAL_TABLET | ORAL | 3 refills | Status: DC

## 2020-10-02 NOTE — Assessment & Plan Note (Signed)
Assessment: Hemoglobin of 9.2 today, normal MCV.  Suspect this is secondary to anemia chronic disease.  Believe her CKD is contributing.  We will continue to monitor and follow.   Plan: -Continue to monitor

## 2020-10-02 NOTE — Assessment & Plan Note (Signed)
Patient requests referral to bariatric specialist, I will refer her to weight loss clinic.

## 2020-10-02 NOTE — Assessment & Plan Note (Signed)
Assessment:  HbA1c at last visit 6.0. Current regimen of victoza 1.8 mg daily and jardiance 10 mg once daily. Because of her worsening renal function, we will DC jardiance. I will call the patient and let her know to stop taking this medication.   Plan: DC Jardiance Continue victozia, use with caution.

## 2020-10-02 NOTE — Progress Notes (Signed)
CC: High blood pressure, kidney disease  HPI:  [name] [name] is a 45 y.o. female with a past medical history stated below and presents today for high blood pressure and kidney disease. Please see problem based assessment and plan for additional details.  Past Medical History:  Diagnosis Date  . Abdominal muscle pain 09/08/2016  . Abnormal Pap smear of cervix 2009  . Abscess    history of multiple abscesses  . Acute bilateral low back pain 02/13/2017  . Acute blood loss anemia   . Acute on chronic renal failure ([place]) 07/12/2012  . Acute renal failure ([place]) 07/12/2012  . Adjustment disorder with depressed mood 03/26/2017  . AKI (acute kidney injury) ([place])   . Anemia of chronic disease 2002  . Anxiety    Panic attacks  . Bilateral lower extremity edema 05/13/2016  . Bipolar disorder ([place])   . Cataract    B/L cataract  . Cellulitis 05/21/2014   right eye  . Chronic bronchitis ([place])    "get it q yr" (05/13/2013)  . Chronic pain   . Dehiscence of amputation stump ([place])   . Depression   . Edema of lower extremity   . Endocarditis 2002   subacute bacterial endocarditis.   . Family history of anesthesia complication    "my mom has a hard time coming out from under"  . Fibromyalgia   . GERD (gastroesophageal reflux disease)    occ  . Heart murmur   . Herpes simplex type 1 infection 01/16/2018  . History of blood transfusion    "just low blood count" (05/13/2013)  . Hyperlipidemia   . Hypertension   . Hypothyroidism   . Hypothyroidism, adult 03/21/2014  . Hypoxia   . Leukocytosis   . Necrosis ([place])    and ulceration  . Necrotizing fasciitis s/p OR debridements 07/06/2012  . Obesity   . OSA on CPAP    does not wear all the time  . Peripheral neuropathy   . Pneumonia   . Type II diabetes mellitus ([facility])    Type  II  . Wound dehiscence 09/04/2019    Current Outpatient Medications on File Prior to Visit  Medication Sig Dispense Refill  . amitriptyline (ELAVIL)  100 MG tablet Take 1 tablet (100 mg total) by mouth at bedtime. 30 tablet 0  . amLODipine (NORVASC) 10 MG tablet Take 1 tablet (10 mg total) by mouth daily.    Marland Kitchen atorvastatin (LIPITOR) 20 MG tablet Take 1 tablet (20 mg total) by mouth daily. 90 tablet 1  . blood glucose meter kit and supplies KIT ICD 10- E11.69. Based on patient and insurance preference. Use up to four times daily as directed. 1 each 0  . bumetanide (BUMEX) 2 MG tablet Take 1 tablet (2 mg total) by mouth 2 (two) times daily. (Patient taking differently: Take 1-2 mg by mouth See admin instructions. Take 2 mg in the morning  1 mg in the afternoon)    . buPROPion (WELLBUTRIN XL) 150 MG 24 hr tablet Take 1 tablet (150 mg total) by mouth every morning. 90 tablet 3  . cyclobenzaprine (FLEXERIL) 5 MG tablet TAKE 1 TABLET BY MOUTH EVERY DAY AS NEEDED FOR MUSCLE SPASMS 30 tablet 0  . diclofenac Sodium (VOLTAREN) 1 % GEL Apply 1 application topically 4 (four) times daily as needed (pain). 100 g 2  . hydrALAZINE (APRESOLINE) 50 MG tablet Take 1 tablet (50 mg total) by mouth 3 (three) times daily. 90 tablet 0  .  hydrOXYzine (ATARAX/VISTARIL) 25 MG tablet TAKE 1 TABLET BY MOUTH EVERY SIX HOURS AS NEEDED FOR ANXIETY 40 tablet 0  . JARDIANCE 10 MG TABS tablet Take 1 tablet by mouth daily before breakfast. 30 tablet 0  . liraglutide (VICTOZA) 18 MG/3ML SOPN Inject 1.8 mg into the skin at bedtime. 27 mL 1  . metoprolol succinate (TOPROL-XL) 25 MG 24 hr tablet TAKE 1 TABLET BY MOUTH EVERY DAY 90 tablet 1  . mupirocin ointment (BACTROBAN) 2 % Apply 1 application topically daily as needed (open skin). 22 g 0  . oxyCODONE-acetaminophen (PERCOCET/ROXICET) 5-325 MG tablet TAKE 1 to 2 TABLETS BY MOUTH EVERY DAY AS NEEDED FOR SEVERE PAIN 45 tablet 0  . pregabalin (LYRICA) 50 MG capsule Take 1 capsule (50 mg total) by mouth 2 (two) times daily. 60 capsule 0  . sodium bicarbonate 650 MG tablet Take 1 tablet (650 mg total) by mouth 2 (two) times daily. 60  tablet 0  . sodium zirconium cyclosilicate (LOKELMA) 10 g PACK packet Take 10 g by mouth 2 (two) times daily. 60 each 0  . TRUEPLUS PEN NEEDLES 31G X 5 MM MISC Use pen needle with insulin 3 times daily (Patient taking differently: 1 each by Other route 3 (three) times daily.) 100 each 5   No current facility-administered medications on file prior to visit.    Family History  Problem Relation Age of Onset  . Heart failure Mother   . Diabetes Mother   . Kidney disease Mother   . Kidney disease Father   . Diabetes Father   . Diabetes Paternal Grandmother   . Heart failure Paternal Grandmother   . Other Other     Social History   Socioeconomic History  . Marital status: Single    Spouse name: Not on file  . Number of children: 0  . Years of education: 62  . Highest education level: Not on file  Occupational History  . Occupation: disability  Tobacco Use  . Smoking status: Current Every Day Smoker    Packs/day: 0.50    Years: 16.00    Pack years: 8.00    Types: Cigarettes    Last attempt to quit: 07/22/2019    Years since quitting: 1.2  . Smokeless tobacco: Never Used  Vaping Use  . Vaping Use: Former  . Devices: CBD oil  Substance and Sexual Activity  . Alcohol use: Yes    Alcohol/week: 0.0 standard drinks    Comment: social  . Drug use: No  . Sexual activity: Yes  Other Topics Concern  . Not on file  Social History Narrative   Lives with mother currently, goddaughter and uncle in a one story home.     On disability for diabetes, neuropathy, sleep apnea etc.  Disability since 2014.    Education: 11th grade.    Social Determinants of Health   Financial Resource Strain: Not on file  Food Insecurity: Not on file  Transportation Needs: Not on file  Physical Activity: Not on file  Stress: Not on file  Social Connections: Not on file  Intimate Partner Violence: Not on file    Review of Systems: ROS negative except for what is noted on the assessment and  plan.  Vitals:   10/02/20 0953 10/02/20 0955 10/02/20 1134 10/02/20 1225  BP: (!) 198/98 (!) 197/99 (!) 200/107 (!) 190/89  Pulse: 86 87 86 92  Temp: 98.1 F (36.7 C)     TempSrc: Oral     SpO2: 100%  Weight: 294 lb 1.6 oz (133.4 kg)     Height: '5\' 9"'  (1.753 m)        Physical Exam: Constitutional: Well-appearing, no acute distress.  Sitting in wheelchair comfortably HENT: normocephalic atraumatic Eyes: conjunctiva non-erythematous Neck: supple Cardiovascular: regular rate Pulmonary/Chest: normal work of breathing on room air MSK: normal bulk and tone, left AKA Neurological: alert & oriented x 3 Skin: warm and dry Psych: Normal mood and thought process   Assessment & Plan:   See Encounters Tab for problem based charting.  Patient discussed with Dr. Donnita Falls, D.O. [facility], PGY-1 Pager: [phone], Phone: [phone] Date 10/02/2020 Time 12:35 PM

## 2020-10-02 NOTE — Assessment & Plan Note (Signed)
Assessment: Initial blood pressure of 200/107.  Prior to her appointment patient only took lisinopril, which was discontinued prior visits due to worsening renal function.  Discussed with her the importance of taking his medications as with persistent high blood pressure she is at high risk for stroke and heart attack as well as progression of her kidney disease and possibly even dialysis.  Patient acknowledges understanding.  States that there are times where she feels like taking her medicines and other times she does not.  She notes that she goes through phases of wanting to focus in on certain aspects of her health and only that part.  While in the clinic, we had the patient take amlodipine as well as metoprolol doses.  Repeat blood pressure improved to 190/89.  Patient would like to meet with counseling services to discuss difficulties with this, I have placed a referral for Dr. Carolynne Edouard.  We will continue her on her current blood pressure medication, I believe if patient is adherent to this she will see much improvement in her blood pressure.  Plan: -Continue hydralazine 50 mg 3 times daily, amlodipine 10 mg daily and Toprol 25 mg daily -Patient instructed to stop taking lisinopril unless otherwise told to resume by nephrology. -Have patient return to clinic in 1 month for recheck.

## 2020-10-02 NOTE — Addendum Note (Signed)
Addended by: Riesa Pope on: 10/02/2020 06:15 PM   Modules accepted: Orders

## 2020-10-02 NOTE — Assessment & Plan Note (Signed)
Assessment: Patient with subclinical hypothyroidism, started on levothyroxine last visit.  We will stop levothyroxine at this time and continue to monitor.   Plan: -Discontinue levothyroxine

## 2020-10-02 NOTE — Assessment & Plan Note (Addendum)
Assessment: Patient's creatinine today 4.29 with GFR under 15.  Prior visit patient's creatinine was 5.02 with a GFR under 10.  Her bicarb has improved to over 20.  Overall suspect worsening of CKD in setting of hypertension and medication nonadherence.  Discussed the importance of taking her medications as that she continues to progress with worsening kidney function this will eventually lead to dialysis.  Patient knowledge understanding of this.  I discussed with her the importance of following up with the kidney doctors as they can help [place] with management of blood pressure as well as all of her medications in terms of her CKD.  She knowledges understanding of this as well.  I have reached out to our clinical pharmacist, Dr. Georgina Peer to discuss renally dosing medications. I have renally dosed her allopurinol to 50 mg twice weekly   Plan: -Patient instructed to call [facility] today to schedule appointment as soon as possible -Follow up with Dr. Georgina Peer if further medications need to be changed  -Repeat BMP at follow up visit.   Addendum:  Dr. Georgina Peer medication recommendations Bupropion - use with caution  Hydroxyzine - administer 50% of usual dose   Jardiance - d/c  Victoza - do not have to d/c just use with caution

## 2020-10-02 NOTE — Patient Instructions (Signed)
Thank you, Ms.Lompico for allowing [place] to provide your care today. Today we discussed   High blood pressure Please take your blood pressure medications of amlodipine, hydralazine, metoprolol.  Please stop taking the lisinopril.  Unless otherwise instructed by your kidney doctor.  Kidney disease Please call [facility] and schedule an appointment to be seen as soon as possible.  I have adjusted some of your medication doses please see below for changes.  I have also reached out to our pharmacist to discuss talking with you about medications that are good and bad for your kidneys.  Please stop taking levothyroxine  Also placed a referral to counseling services with Dr. Carolynne Edouard.  You will be receiving a phone call to follow-up with her.  I have ordered the following labs for you:   Lab Orders     ToxAssure Select,+Antidepr,UR     BMP w [place] (STAT/Sunquest-performed on-site)     CBC no Diff    Referrals ordered today:   Referral Orders  No referral(s) requested today     I have ordered the following medication/changed the following medications:   Stop the following medications: Medications Discontinued During This Encounter  Medication Reason  . allopurinol (ZYLOPRIM) 100 MG tablet Reorder     Start the following medications: Meds ordered this encounter  Medications  . hydrALAZINE (APRESOLINE) tablet 50 mg  . metoprolol succinate (TOPROL-XL) 24 hr tablet 25 mg  . allopurinol (ZYLOPRIM) 100 MG tablet    Sig: Take 0.5 tablets (50 mg total) by mouth 2 (two) times a week.    Dispense:  60 tablet    Refill:  3     Follow up: 1 month    Remember: Please call [facility] and schedule an appoint to be seen a soon as possible  Should you have any questions or concerns please call the internal medicine clinic at [phone].     Sanjuana Letters, D.O. [place] [place]

## 2020-10-05 ENCOUNTER — Ambulatory Visit: Payer: Medicare HMO | Admitting: Physical Therapy

## 2020-10-05 ENCOUNTER — Telehealth: Payer: Self-pay | Admitting: Physical Therapy

## 2020-10-05 NOTE — Telephone Encounter (Signed)
Called pt due to missed appointment. Pt reports scheduling transportation but that transportation did not arrive to pick her up. Reminded pt of next visit and pt reports understanding. Bjorn Loser, PTA  10/05/20, 10:59 AM

## 2020-10-05 NOTE — Progress Notes (Signed)
Internal Medicine Clinic Attending  Case discussed with Dr. Katsadouros  At the time of the visit.  We reviewed the resident's history and exam and pertinent patient test results.  I agree with the assessment, diagnosis, and plan of care documented in the resident's note.  

## 2020-10-06 ENCOUNTER — Encounter: Payer: Self-pay | Admitting: Student

## 2020-10-06 NOTE — Telephone Encounter (Signed)
Thank you Glenda... 

## 2020-10-06 NOTE — Telephone Encounter (Signed)
Call from pt - stated she did not understand the My Chart message per Dr Johnney Ou. Relay message to stop Jardiance and thyroid med (she stated levothyroxine, I stated correct). And she stated she's not having any vaginal discharge. She stated she has blisters on her stump; she asked if she should call Dr Sharol Given, I told her yes.

## 2020-10-07 ENCOUNTER — Other Ambulatory Visit: Payer: Self-pay

## 2020-10-07 ENCOUNTER — Ambulatory Visit: Payer: Medicare HMO | Admitting: Physical Therapy

## 2020-10-07 ENCOUNTER — Ambulatory Visit: Payer: Self-pay | Admitting: Physical Therapy

## 2020-10-07 DIAGNOSIS — R296 Repeated falls: Secondary | ICD-10-CM | POA: Diagnosis not present

## 2020-10-07 DIAGNOSIS — R2681 Unsteadiness on feet: Secondary | ICD-10-CM | POA: Diagnosis not present

## 2020-10-07 DIAGNOSIS — R2689 Other abnormalities of gait and mobility: Secondary | ICD-10-CM

## 2020-10-07 DIAGNOSIS — R293 Abnormal posture: Secondary | ICD-10-CM | POA: Diagnosis not present

## 2020-10-07 DIAGNOSIS — M25652 Stiffness of left hip, not elsewhere classified: Secondary | ICD-10-CM | POA: Diagnosis not present

## 2020-10-08 ENCOUNTER — Encounter: Payer: Self-pay | Admitting: Physical Therapy

## 2020-10-08 ENCOUNTER — Other Ambulatory Visit: Payer: Self-pay | Admitting: Internal Medicine

## 2020-10-08 DIAGNOSIS — E1159 Type 2 diabetes mellitus with other circulatory complications: Secondary | ICD-10-CM

## 2020-10-08 DIAGNOSIS — I152 Hypertension secondary to endocrine disorders: Secondary | ICD-10-CM

## 2020-10-08 NOTE — Therapy (Signed)
[facility] [address] [place], [place], [address] Phone: [phone]   Fax:  [phone]  Physical Therapy Treatment  Patient Details  Name: [name] [name] MRN: [id number] Date of Birth: Jun 30, 1975 Referring Provider (PT): Meridee Score, MD   Encounter Date: 10/07/2020   PT End of Session - 10/08/20 1320    Visit Number 8    Number of Visits 17   per updated POC   Date for PT Re-Evaluation 10/25/20   updated POC   Authorization Type [employer] [place] and Medicaid (16 visits approved from 4/6-10/25/20))    Authorization - Visit Number 8    Authorization - Number of Visits 16    Progress Note Due on Visit 10    PT Start Time 3295    PT Stop Time 1245    PT Time Calculation (min) 60 min    Equipment Utilized During Treatment Other (comment)   bar bells, water walker, flotation belt   Activity Tolerance Patient tolerated treatment well    Behavior During Therapy WFL for tasks assessed/performed           Past Medical History:  Diagnosis Date  . Abdominal muscle pain 09/08/2016  . Abnormal Pap smear of cervix 2009  . Abscess    history of multiple abscesses  . Acute bilateral low back pain 02/13/2017  . Acute blood loss anemia   . Acute on chronic renal failure ([facility]) 07/12/2012  . Acute renal failure ([facility]) 07/12/2012  . Adjustment disorder with depressed mood 03/26/2017  . AKI (acute kidney injury) ([facility])   . Anemia of chronic disease 2002  . Anxiety    Panic attacks  . Bilateral lower extremity edema 05/13/2016  . Bipolar disorder ([facility])   . Cataract    B/L cataract  . Cellulitis 05/21/2014   right eye  . Chronic bronchitis ([facility])    "get it q yr" (05/13/2013)  . Chronic pain   . Dehiscence of amputation stump ([facility])   . Depression   . Edema of lower extremity   . Endocarditis 2002   subacute bacterial endocarditis.   . Family history of anesthesia complication    "my mom has a hard time coming out from  under"  . Fibromyalgia   . GERD (gastroesophageal reflux disease)    occ  . Heart murmur   . Herpes simplex type 1 infection 01/16/2018  . History of blood transfusion    "just low blood count" (05/13/2013)  . Hyperlipidemia   . Hypertension   . Hypothyroidism   . Hypothyroidism, adult 03/21/2014  . Hypoxia   . Leukocytosis   . Necrosis ([facility])    and ulceration  . Necrotizing fasciitis s/p OR debridements 07/06/2012  . Obesity   . OSA on CPAP    does not wear all the time  . Peripheral neuropathy   . Pneumonia   . Type II diabetes mellitus (HCC)    Type  II  . Wound dehiscence 09/04/2019    Past Surgical History:  Procedure Laterality Date  . ABDOMINAL AORTOGRAM W/LOWER EXTREMITY Left 03/19/2019   Procedure: ABDOMINAL AORTOGRAM W/LOWER EXTREMITY;  Surgeon: Serafina Mitchell, MD;  Location: [facility];  Service: Cardiovascular;  Laterality: Left;  . ABOVE KNEE LEG AMPUTATION Left 10/11/2019   wound dehisence   . AIR/FLUID EXCHANGE Right 12/31/2019   Procedure: AIR/FLUID EXCHANGE;  Surgeon: Jalene Mullet, MD;  Location: [facility];  Service: Ophthalmology;  Laterality: Right;  . AMPUTATION Left 04/07/2017  Procedure: LEFT BELOW KNEE AMPUTATION;  Surgeon: Newt Minion, MD;  Location: [facility];  Service: Orthopedics;  Laterality: Left;  . AMPUTATION Left 10/11/2019   Procedure: LEFT ABOVE KNEE AMPUTATION;  Surgeon: Newt Minion, MD;  Location: [facility];  Service: Orthopedics;  Laterality: Left;  . CATARACT EXTRACTION W/PHACO Right 02/11/2020   Procedure: CATARACT EXTRACTION PHACO AND INTRAOCULAR LENS PLACEMENT (IOC);  Surgeon: Jalene Mullet, MD;  Location: [facility];  Service: Ophthalmology;  Laterality: Right;  . CORONARY ARTERY BYPASS GRAFT    . EYE SURGERY     lazer  . GAS INSERTION Right 12/31/2019   Procedure: INSERTION OF GAS;  Surgeon: Jalene Mullet, MD;  Location: [facility];  Service: Ophthalmology;  Laterality: Right;  . INCISION AND DRAINAGE ABSCESS     multiple I&Ds  .  INCISION AND DRAINAGE ABSCESS Left 07/09/2012   Procedure: DRESSING CHANGE, THIGH WOUND;  Surgeon: Harl Bowie, MD;  Location: [facility];  Service: General;  Laterality: Left;  . INCISION AND DRAINAGE OF WOUND Left 07/07/2012   Procedure: IRRIGATION AND DEBRIDEMENT WOUND;  Surgeon: Harl Bowie, MD;  Location: [facility];  Service: General;  Laterality: Left;  . INCISION AND DRAINAGE PERIRECTAL ABSCESS Left 07/14/2012   Procedure: DEBRIDEMENT OF SKIN & SOFT TISSUE; DRESSING CHANGE UNDER ANESTHESIA;  Surgeon: Gayland Curry, MD,FACS;  Location: [facility];  Service: General;  Laterality: Left;  . INCISION AND DRAINAGE PERIRECTAL ABSCESS Left 07/16/2012   Procedure: I&D Left Thigh;  Surgeon: Gwenyth Ober, MD;  Location: [facility];  Service: General;  Laterality: Left;  . INCISION AND DRAINAGE PERIRECTAL ABSCESS N/A 01/05/2015   Procedure: IRRIGATION AND DEBRIDEMENT PERIRECTAL ABSCESS;  Surgeon: Donnie Mesa, MD;  Location: [facility];  Service: General;  Laterality: N/A;  . IRRIGATION AND DEBRIDEMENT ABSCESS Left 07/06/2012   Procedure: IRRIGATION AND DEBRIDEMENT ABSCESS BUTTOCKS AND THIGH;  Surgeon: Shann Medal, MD;  Location: [facility];  Service: General;  Laterality: Left;  . IRRIGATION AND DEBRIDEMENT ABSCESS Left 08/10/2012   Procedure: IRRIGATION AND DEBRIDEMENT ABSCESS;  Surgeon: Madilyn Hook, DO;  Location: [facility];  Service: General;  Laterality: Left;  . PARS PLANA VITRECTOMY Right 12/31/2019   Procedure: PARS PLANA VITRECTOMY WITH 25 GAUGE;  Surgeon: Jalene Mullet, MD;  Location: [facility];  Service: Ophthalmology;  Laterality: Right;  . PHOTOCOAGULATION WITH LASER Right 12/31/2019   Procedure: PHOTOCOAGULATION WITH LASER;  Surgeon: Jalene Mullet, MD;  Location: [facility];  Service: Ophthalmology;  Laterality: Right;  . REPAIR OF COMPLEX TRACTION RETINAL DETACHMENT Right 12/31/2019   Procedure: REPAIR OF COMPLEX TRACTION RETINAL;  Surgeon: Jalene Mullet, MD;  Location: [facility];  Service: Ophthalmology;   Laterality: Right;  . STUMP REVISION Left 06/09/2017   Procedure: REVISION LEFT BELOW KNEE AMPUTATION;  Surgeon: Newt Minion, MD;  Location: [facility];  Service: Orthopedics;  Laterality: Left;  . STUMP REVISION Left 09/04/2019   Procedure: LEFT BELOW KNEE AMPUTATION REVISION;  Surgeon: Newt Minion, MD;  Location: [facility];  Service: Orthopedics;  Laterality: Left;  . STUMP REVISION Left 09/20/2019   Procedure: LEFT BELOW KNEE AMPUTATION REVISION;  Surgeon: Newt Minion, MD;  Location: [facility];  Service: Orthopedics;  Laterality: Left;  . TONSILLECTOMY  1994    There were no vitals filed for this visit.   Subjective Assessment - 10/08/20 1316    Subjective Pt presents for aquatic therapy at [facility]; states "this is first time I've been in a pool in many years"  Limitations Walking;House hold activities;Standing    How long can you stand comfortably? 5 mins    How long can you walk comfortably? 5 mins    Patient Stated Goals "I want to be able to walk on this leg and move around"    Currently in Pain? Other (Comment)   pt reports no pain at rest but pain in RLE with weight bearing   Pain Onset More than a month ago            Aquatic therapy at [facility]    Patient seen for aquatic therapy today.  Treatment took place in water 3.6-4.8 feet deep depending upon activity.  Pt entered and exited the   the pool via hydraulic chair lift; pt transferred from wheelchair to chair lift with SBA and chair lift to w/c with CGA for safety.  Pt performed trunk strengthening/core stabilization exercises - seated on bench in pool - pt held dumb bells - performed shoulder protraction/retraction 10 reps; shoulder horizontal abduction/adduction 10 reps; trunk rotation to Rt and Lt sides holding dumbbells with elbows extended 10 reps to each side;  Pt initially stabilized herself against pool wall behind her but was able to gradually move away from wall and perform exercises without external  support  Pt gait trained in 4' water depth with use of water walker initially, but pt reported increased pain in Rt knee; floatation belt was added for increased buoyancy and moved to deeper water (4.5'); pt gait trained 18' x 2 reps across pool with min assist to stabilize walker; noodle was used for UE support  18' x 2 reps with rest period after each rep of 18' across pool  Pt performed static standing balance exercise with use of water walker - pt stood against pool wall for stability - pushed walker away from her and then pulled back toward her for core stabilization with small perturbation and small movements of bil. UE's; pt able to move away from wall after approx. 5 reps - performed 10 reps of pushing/pulling walker for facilitation of static standing balance;  Used bar bells for resisted standing balance - horizontal abduction/adduction 10 reps; bil. Shoulder protraction/retraction 10 reps and trunk rotation 10 reps with min assist prn recovery or LOB  Pt performed RLE strengthening - seated on bench in pool - LAQ's 3 sets 10 reps with pt using bil. UE's to maintain balance; hip abduction/adduction  RLE and LLE 3 sets 10 reps  Pt in supine position - used noodle under arms and floatation belt; bicycling LE's approx. 30' x 4 reps (length of pool); hip abduction/adduction performed 30' x 4 reps   Pt requires buoyancy of water for joint off loading for reduced pain with weight bearing on RLE - pt reported decreased pain in water than that experienced with weight bearing on land.  Buoyancy resisted exercises beneficial for Lt hip flexor stretching and strengthening.  Buoyancy needed for support and for safety for reduced fall risk with activities in standing and with gait training.  Viscosity of water needed for resistance for strengthening of trunk musc. for improved core stabilization and for LE strengthening.                                PT Short Term Goals - 10/08/20  1331      PT SHORT TERM GOAL #1   Title Pt will be independent with ongoing standing HEP with prosthesis.   (  Target Date: 09/25/20)    Baseline met per pt reporting    Time 4    Period Weeks    Status Achieved    Target Date 09/25/20      PT SHORT TERM GOAL #2   Title Pt will be able to tolerate wearing prosthesis 5 hrs, 2x/day with no skin issues and with good understanding of swelling management.    Baseline wearing 4 hrs, 2x/day consistently, educated to increase to 5hrs, 2x/day    Time 4    Period Weeks    Status Partially Met      PT SHORT TERM GOAL #3   Title Pt will  perform dynamic tasks with intermittent UE support x 10 mins at S level and reach to floor level to retrieve item.    Baseline Stood/ambulated x 11 mins total performing multiple standing balance tasks and reaching to floor with single UE support to retrieve item.    Time 4    Period Weeks    Status Achieved      PT SHORT TERM GOAL #4   Title Pt will ambulate x 300' with RW at mod I level without rest break in order to indicate improved activity tolerance.    Baseline met at mostly mod I level, min cues for upright posture    Time 4    Period Weeks    Status Partially Met      PT SHORT TERM GOAL #5   Title Pt will improve gait speed to >/=1.44 ft/sec w/ RW and prosthesis to indicate dec fall risk.    Baseline 1.60 ft/sec with RW and prosthesis    Time 4    Period Weeks    Status Achieved             PT Long Term Goals - 10/08/20 1331      PT LONG TERM GOAL #1   Title Pt will be independent with final HEP in order to indicate improved functional mobility and balance with use of prosthesis.  (Target Date: 10/25/20)    Time 8    Period Weeks    Status New      PT LONG TERM GOAL #2   Title Pt will be able to tolerate wearing for 90% of awake hours without skin breakdown and with good understanding of swelling mangement.    Baseline 4hrs, 2x/day, still having some swelling    Time 8    Period Weeks     Status New      PT LONG TERM GOAL #3   Title Pt will tolerate standing x 15 mins while performing tasks intermittently without UE support and pick up item from floor with single UE support at mod I level.    Baseline able to stand 5 mins at a time    Time 8    Period Weeks    Status New      PT LONG TERM GOAL #4   Title Pt will improve gait speed to >/=2.00 ft/sec with LRAD and prosthesis to indicate dec fall risk.    Baseline .28 ft/sec with wide RW    Time 8    Period Weeks    Status New      PT LONG TERM GOAL #5   Title Pt will ambulate x 25' w/ SBQC (or LRAD) and prosthesis at min A level in order to indicate improved independence with gait.    Baseline 10' at min A level    Time 8  Period Weeks    Status New      PT LONG TERM GOAL #6   Title Pt will negotiate up/down 3 steps with single rail and crutch (or LRAD) at S level in order to enter/exit home safely.    Time 8    Period Weeks    Status New                 Plan - 10/08/20 1322    Clinical Impression Statement Aquatic therapy session focused on trunk strengthening/core stabilization, RLE strengthening exercises and gait training in 4.5' water depth for max buoyancy for joint off loading to minimize pain in Rt knee as much as possible.  Pt needed pool wall behind her for increased stability initially with standing exercises, but was gradually able to move away from wall after several repetitions and increasing comfort level with the exercise.  Pt reported less pain in RLE with use of flotation belt in 4.5' water depth.  Pt did continue to c/o pain in RLE throughout session.  Pt demonstrates decreased core stabilization as pt initially needed pool wall behind her for increased stability with trunk stabilization exercises.  Cont with POC.    Personal Factors and Comorbidities Comorbidity 3+;Past/Current Experience;Time since onset of injury/illness/exacerbation    Comorbidities see above    Examination-Activity  Limitations Bend;Carry;Dressing;Stand;Stairs;Squat;Locomotion Level;Transfers    Examination-Participation Restrictions Community Activity;Meal Prep;Shop;Laundry    Stability/Clinical Decision Making Evolving/Moderate complexity    Rehab Potential Good    PT Frequency 2x / week    PT Duration 8 weeks    PT Treatment/Interventions ADLs/Self Care Home Management;DME Instruction;Gait training;Stair training;Functional mobility training;Therapeutic activities;Therapeutic exercise;Balance training;Neuromuscular re-education;Patient/family education;Prosthetic Training;Compression bandaging;Passive range of motion;Energy conservation;Iontophoresis 13m/ml Dexamethasone;Cryotherapy;Aquatic Therapy    PT Next Visit Plan Update HEP as needed based on what she is doing at home already,  Work on her activating knee/feeling comfortable being on prosthesis, balance, endurance    PT Home Exercise Plan We discussed doing aquatic PT 1x/wk for a month (4 weeks) and recerting for another 4-6 weeks with her then working at home on endurance and strength to return in about sept/oct to work on getting on cane.  She is good with this plan.    Consulted and Agree with Plan of Care Patient           Patient will benefit from skilled therapeutic intervention in order to improve the following deficits and impairments:  Abnormal gait,Decreased activity tolerance,Decreased balance,Decreased endurance,Decreased knowledge of precautions,Decreased knowledge of use of DME,Decreased mobility,Decreased range of motion,Decreased strength,Difficulty walking,Increased edema,Impaired perceived functional ability,Impaired flexibility,Impaired sensation,Postural dysfunction,Improper body mechanics,Prosthetic Dependency,Pain  Visit Diagnosis: Unsteadiness on feet  Other abnormalities of gait and mobility     Problem List Patient Active Problem List   Diagnosis Date Noted  . Cough 05/26/2020  . Chronic pain syndrome 01/16/2020   . Hx of AKA (above knee amputation), left ([facility]   . Symptomatic anemia 10/21/2019  . Anemia 10/21/2019  . Acute respiratory failure with hypoxia ([facility] 10/21/2019  . Blurry vision, right eye 10/08/2019  . Tobacco use 03/01/2019  . Phantom limb pain ([facility] 11/08/2018  . Current moderate episode of major depressive disorder ([facility]   . Hyperkalemia   . Anemia of chronic disease   . Severe protein-calorie malnutrition ([facility]   . Fall   . Status post below knee amputation, left ([facility] 04/11/2017  . Bipolar affective disorder ([facility]   . Heart failure with preserved ejection fraction ([facility] 02/09/2017  . Routine adult health  maintenance 12/08/2016  . Chronic pain of right knee 09/08/2016  . Chronic kidney disease (CKD), stage III (moderate) ([facility]) 07/15/2016  . OSA (obstructive sleep apnea) 05/13/2016  . Vaginal irritation 03/14/2016  . Morbid obesity due to excess calories ([facility]) 11/27/2015  . Subclinical hypothyroidism 04/25/2013  . Controlled type 2 diabetes mellitus ([facility]) 07/12/2012  . Carpal tunnel syndrome, bilateral 01/25/2012  . Diabetic polyneuropathy associated with type 2 diabetes mellitus ([facility]) 07/04/2011  . Anxiety and depression 06/02/2011  . GERD 12/06/2007  . Hyperlipidemia 08/29/2007  . Hypertension associated with diabetes ([facility] [facility]) 08/29/2007    [name] [name], [name] [name], [place], [place] 10/08/2020, 1:32 PM  [facility] [address] [place], [place], [address] Phone: [phone]   Fax:  [phone]  Name: DASHLEY MONTS MRN: [id number] Date of Birth: December 07, 1975

## 2020-10-12 ENCOUNTER — Other Ambulatory Visit: Payer: Self-pay | Admitting: Internal Medicine

## 2020-10-12 ENCOUNTER — Encounter: Payer: Self-pay | Admitting: Physician Assistant

## 2020-10-12 ENCOUNTER — Other Ambulatory Visit: Payer: Self-pay

## 2020-10-12 ENCOUNTER — Ambulatory Visit: Payer: Medicare HMO

## 2020-10-12 ENCOUNTER — Ambulatory Visit: Payer: Self-pay | Admitting: Physical Therapy

## 2020-10-12 ENCOUNTER — Encounter: Payer: Self-pay | Admitting: Student

## 2020-10-12 ENCOUNTER — Encounter: Payer: Self-pay | Admitting: Physical Therapy

## 2020-10-12 ENCOUNTER — Ambulatory Visit (INDEPENDENT_AMBULATORY_CARE_PROVIDER_SITE_OTHER): Payer: Medicare HMO | Admitting: Physician Assistant

## 2020-10-12 DIAGNOSIS — R2689 Other abnormalities of gait and mobility: Secondary | ICD-10-CM | POA: Diagnosis not present

## 2020-10-12 DIAGNOSIS — R293 Abnormal posture: Secondary | ICD-10-CM | POA: Diagnosis not present

## 2020-10-12 DIAGNOSIS — M25561 Pain in right knee: Secondary | ICD-10-CM

## 2020-10-12 DIAGNOSIS — R2681 Unsteadiness on feet: Secondary | ICD-10-CM | POA: Diagnosis not present

## 2020-10-12 DIAGNOSIS — G8929 Other chronic pain: Secondary | ICD-10-CM | POA: Diagnosis not present

## 2020-10-12 DIAGNOSIS — R296 Repeated falls: Secondary | ICD-10-CM | POA: Diagnosis not present

## 2020-10-12 DIAGNOSIS — G894 Chronic pain syndrome: Secondary | ICD-10-CM

## 2020-10-12 DIAGNOSIS — M25652 Stiffness of left hip, not elsewhere classified: Secondary | ICD-10-CM | POA: Diagnosis not present

## 2020-10-12 DIAGNOSIS — E1121 Type 2 diabetes mellitus with diabetic nephropathy: Secondary | ICD-10-CM

## 2020-10-12 MED ORDER — LIDOCAINE HCL 1 % IJ SOLN
5.0000 mL | INTRAMUSCULAR | Status: AC | PRN
  Administered 2020-10-12: 5 mL

## 2020-10-12 MED ORDER — METHYLPREDNISOLONE ACETATE 40 MG/ML IJ SUSP
40.0000 mg | INTRAMUSCULAR | Status: AC | PRN
  Administered 2020-10-12: 40 mg via INTRA_ARTICULAR

## 2020-10-12 NOTE — [facility] Notes (Signed)
Office Visit Note   Patient: [name] [name]           Date of Birth: 01-Jul-1975           MRN: [id number] Visit Date: 10/12/2020              Requested by: Riesa Pope, MD [address] [place],  [place] [address] PCP: Riesa Pope, MD  Chief Complaint  Patient presents with  . Left Knee - Follow-up      HPI:   Assessment & Plan: Visit Diagnoses:  1. Chronic pain of right knee     Plan  Follow-Up Instructions: Return in about 4 weeks (around 11/09/2020).   Ortho Exam  Patient is alert, oriented, no adenopathy, well-dressed, normal affect, normal respiratory effort.   Imaging: No results found. No images are attached to the encounter.  Labs: Lab Results  Component Value Date   HGBA1C 6.0 (A) 09/10/2020   HGBA1C 8.5 (A) 02/13/2020   HGBA1C 8.5 (H) 09/04/2019   ESRSEDRATE 130 (H) 03/24/2017   ESRSEDRATE 53 (H) 09/14/2015   ESRSEDRATE 132 (H) 07/06/2012   CRP 13.6 (H) 02/14/2019   CRP 38.6 (H) 04/03/2018   CRP 5.4 (H) 09/14/2015   LABURIC 8.3 (H) 04/15/2019   REPTSTATUS 10/27/2019 FINAL 10/22/2019   GRAMSTAIN  09/04/2019    FEW WBC PRESENT, PREDOMINANTLY PMN MODERATE GRAM POSITIVE COCCI    CULT  10/22/2019    NO GROWTH 5 DAYS Performed at [facility], [place] [address] [place], [place] [address]    LABORGA STAPHYLOCOCCUS AUREUS 09/04/2019     Lab Results  Component Value Date   ALBUMIN 2.4 (L) 10/21/2019   ALBUMIN 2.3 (L) 09/18/2019   ALBUMIN 1.8 (L) 09/01/2019    Lab Results  Component Value Date   MG 2.3 04/07/2018   MG 2.1 04/05/2018   MG 2.4 03/25/2017   No results found for: VD25OH  No results found for: PREALBUMIN CBC EXTENDED Latest Ref Rng & Units 10/02/2020 09/10/2020 02/11/2020  WBC 4.0 - 10.5 K/uL 5.4 5.2 5.2  RBC 3.87 - 5.11 MIL/uL 3.54(L) 3.93 4.28  HGB 12.0 - 15.0 g/dL 9.2(L) 10.3(L) 10.9(L)  HCT 36.0 - 46.0 % 30.7(L) 32.2(L) 34.8(L)  PLT 150 - 400 K/uL 340 285 284  NEUTROABS 1.7 - 7.7 K/uL - - -   LYMPHSABS 0.7 - 4.0 K/uL - - -     There is no height or weight on file to calculate BMI.  Orders:  No orders of the defined types were placed in this encounter.  No orders of the defined types were placed in this encounter.    Procedures: Large Joint Inj: R knee on 10/12/2020 3:58 PM Indications: pain and diagnostic evaluation Details: 22 G 1.5 in needle, anteromedial approach  Arthrogram: No  Medications: 40 mg methylPREDNISolone acetate 40 MG/ML; 5 mL lidocaine 1 % Outcome: tolerated well, no immediate complications Procedure, treatment alternatives, risks and benefits explained, specific risks discussed. Consent was given by the patient.      Clinical Data: No additional findings.  ROS:  All other systems negative, except as noted in the HPI. Review of Systems  Objective: Vital Signs: There were no vitals taken for this visit.  Specialty Comments:  No specialty comments available.  PMFS History: Patient Active Problem List   Diagnosis Date Noted  . Cough 05/26/2020  . Chronic pain syndrome 01/16/2020  . Hx of AKA (above knee amputation), left ([place])   . Symptomatic anemia 10/21/2019  .  Anemia 10/21/2019  . Acute respiratory failure with hypoxia ([place]) 10/21/2019  . Blurry vision, right eye 10/08/2019  . Tobacco use 03/01/2019  . Phantom limb pain ([place]) 11/08/2018  . Current moderate episode of major depressive disorder ([place])   . Hyperkalemia   . Anemia of chronic disease   . Severe protein-calorie malnutrition ([place])   . Fall   . Status post below knee amputation, left ([place]) 04/11/2017  . Bipolar affective disorder ([place])   . Heart failure with preserved ejection fraction ([place]) 02/09/2017  . Routine adult health maintenance 12/08/2016  . Chronic pain of right knee 09/08/2016  . Chronic kidney disease (CKD), stage III (moderate) ([place]) 07/15/2016  . OSA (obstructive sleep apnea) 05/13/2016  . Vaginal irritation 03/14/2016  . Morbid obesity due to excess  calories ([place]) 11/27/2015  . Subclinical hypothyroidism 04/25/2013  . Controlled type 2 diabetes mellitus ([place]) 07/12/2012  . Carpal tunnel syndrome, bilateral 01/25/2012  . Diabetic polyneuropathy associated with type 2 diabetes mellitus ([place]) 07/04/2011  . Anxiety and depression 06/02/2011  . GERD 12/06/2007  . Hyperlipidemia 08/29/2007  . Hypertension associated with diabetes ([place]) 08/29/2007   Past Medical History:  Diagnosis Date  . Abdominal muscle pain 09/08/2016  . Abnormal Pap smear of cervix 2009  . Abscess    history of multiple abscesses  . Acute bilateral low back pain 02/13/2017  . Acute blood loss anemia   . Acute on chronic renal failure ([place]) 07/12/2012  . Acute renal failure ([place]) 07/12/2012  . Adjustment disorder with depressed mood 03/26/2017  . AKI (acute kidney injury) ([place])   . Anemia of chronic disease 2002  . Anxiety    Panic attacks  . Bilateral lower extremity edema 05/13/2016  . Bipolar disorder ([place])   . Cataract    B/L cataract  . Cellulitis 05/21/2014   right eye  . Chronic bronchitis ([place])    "get it q yr" (05/13/2013)  . Chronic pain   . Dehiscence of amputation stump ([place])   . Depression   . Edema of lower extremity   . Endocarditis 2002   subacute bacterial endocarditis.   . Family history of anesthesia complication    "my mom has a hard time coming out from under"  . Fibromyalgia   . GERD (gastroesophageal reflux disease)    occ  . Heart murmur   . Herpes simplex type 1 infection 01/16/2018  . History of blood transfusion    "just low blood count" (05/13/2013)  . Hyperlipidemia   . Hypertension   . Hypothyroidism   . Hypothyroidism, adult 03/21/2014  . Hypoxia   . Leukocytosis   . Necrosis ([place])    and ulceration  . Necrotizing fasciitis s/p OR debridements 07/06/2012  . Obesity   . OSA on CPAP    does not wear all the time  . Peripheral neuropathy   . Pneumonia   . Type II diabetes mellitus ([facility])    Type  II  . Wound  dehiscence 09/04/2019    Family History  Problem Relation Age of Onset  . Heart failure Mother   . Diabetes Mother   . Kidney disease Mother   . Kidney disease Father   . Diabetes Father   . Diabetes Paternal Grandmother   . Heart failure Paternal Grandmother   . Other Other     Past Surgical History:  Procedure Laterality Date  . ABDOMINAL AORTOGRAM W/LOWER EXTREMITY Left 03/19/2019   Procedure: ABDOMINAL AORTOGRAM W/LOWER EXTREMITY;  Surgeon: Serafina Mitchell,  MD;  Location: [facility];  Service: Cardiovascular;  Laterality: Left;  . ABOVE KNEE LEG AMPUTATION Left 10/11/2019   wound dehisence   . AIR/FLUID EXCHANGE Right 12/31/2019   Procedure: AIR/FLUID EXCHANGE;  Surgeon: Jalene Mullet, MD;  Location: [facility];  Service: Ophthalmology;  Laterality: Right;  . AMPUTATION Left 04/07/2017   Procedure: LEFT BELOW KNEE AMPUTATION;  Surgeon: Newt Minion, MD;  Location: [facility];  Service: Orthopedics;  Laterality: Left;  . AMPUTATION Left 10/11/2019   Procedure: LEFT ABOVE KNEE AMPUTATION;  Surgeon: Newt Minion, MD;  Location: [facility];  Service: Orthopedics;  Laterality: Left;  . CATARACT EXTRACTION W/PHACO Right 02/11/2020   Procedure: CATARACT EXTRACTION PHACO AND INTRAOCULAR LENS PLACEMENT (IOC);  Surgeon: Jalene Mullet, MD;  Location: [facility];  Service: Ophthalmology;  Laterality: Right;  . CORONARY ARTERY BYPASS GRAFT    . EYE SURGERY     [place]  . GAS INSERTION Right 12/31/2019   Procedure: INSERTION OF GAS;  Surgeon: Jalene Mullet, MD;  Location: [facility];  Service: Ophthalmology;  Laterality: Right;  . INCISION AND DRAINAGE ABSCESS     multiple I&Ds  . INCISION AND DRAINAGE ABSCESS Left 07/09/2012   Procedure: DRESSING CHANGE, THIGH WOUND;  Surgeon: Harl Bowie, MD;  Location: [facility];  Service: General;  Laterality: Left;  . INCISION AND DRAINAGE OF WOUND Left 07/07/2012   Procedure: IRRIGATION AND DEBRIDEMENT WOUND;  Surgeon: Harl Bowie, MD;  Location: [facility];   Service: General;  Laterality: Left;  . INCISION AND DRAINAGE PERIRECTAL ABSCESS Left 07/14/2012   Procedure: DEBRIDEMENT OF SKIN & SOFT TISSUE; DRESSING CHANGE UNDER ANESTHESIA;  Surgeon: Gayland Curry, MD,FACS;  Location: [facility];  Service: General;  Laterality: Left;  . INCISION AND DRAINAGE PERIRECTAL ABSCESS Left 07/16/2012   Procedure: I&D Left Thigh;  Surgeon: Gwenyth Ober, MD;  Location: [facility];  Service: General;  Laterality: Left;  . INCISION AND DRAINAGE PERIRECTAL ABSCESS N/A 01/05/2015   Procedure: IRRIGATION AND DEBRIDEMENT PERIRECTAL ABSCESS;  Surgeon: Donnie Mesa, MD;  Location: [facility];  Service: General;  Laterality: N/A;  . IRRIGATION AND DEBRIDEMENT ABSCESS Left 07/06/2012   Procedure: IRRIGATION AND DEBRIDEMENT ABSCESS BUTTOCKS AND THIGH;  Surgeon: Shann Medal, MD;  Location: [facility];  Service: General;  Laterality: Left;  . IRRIGATION AND DEBRIDEMENT ABSCESS Left 08/10/2012   Procedure: IRRIGATION AND DEBRIDEMENT ABSCESS;  Surgeon: Madilyn Hook, DO;  Location: [facility];  Service: General;  Laterality: Left;  . PARS PLANA VITRECTOMY Right 12/31/2019   Procedure: PARS PLANA VITRECTOMY WITH 25 GAUGE;  Surgeon: Jalene Mullet, MD;  Location: [facility];  Service: Ophthalmology;  Laterality: Right;  . PHOTOCOAGULATION WITH LASER Right 12/31/2019   Procedure: PHOTOCOAGULATION WITH LASER;  Surgeon: Jalene Mullet, MD;  Location: [facility];  Service: Ophthalmology;  Laterality: Right;  . REPAIR OF COMPLEX TRACTION RETINAL DETACHMENT Right 12/31/2019   Procedure: REPAIR OF COMPLEX TRACTION RETINAL;  Surgeon: Jalene Mullet, MD;  Location: [facility];  Service: Ophthalmology;  Laterality: Right;  . STUMP REVISION Left 06/09/2017   Procedure: REVISION LEFT BELOW KNEE AMPUTATION;  Surgeon: Newt Minion, MD;  Location: [facility];  Service: Orthopedics;  Laterality: Left;  . STUMP REVISION Left 09/04/2019   Procedure: LEFT BELOW KNEE AMPUTATION REVISION;  Surgeon: Newt Minion, MD;  Location: [facility];  Service:  Orthopedics;  Laterality: Left;  . STUMP REVISION Left 09/20/2019   Procedure: LEFT BELOW KNEE AMPUTATION REVISION;  Surgeon: Newt Minion, MD;  Location: [facility];  Service: Orthopedics;  Laterality: Left;  . TONSILLECTOMY  1994   Social History   Occupational History  . Occupation: disability  Tobacco Use  . Smoking status: Current Every Day Smoker    Packs/day: 0.50    Years: 16.00    Pack years: 8.00    Types: Cigarettes    Last attempt to quit: 07/22/2019    Years since quitting: 1.2  . Smokeless tobacco: Never Used  Vaping Use  . Vaping Use: Former  . Devices: CBD oil  Substance and Sexual Activity  . Alcohol use: Yes    Alcohol/week: 0.0 standard drinks    Comment: social  . Drug use: No  . Sexual activity: Yes

## 2020-10-12 NOTE — Therapy (Signed)
[facility] [address] [place], [place], [address] Phone: [phone]   Fax:  [phone]  Physical Therapy Treatment  Patient Details  Name: [name] [name] MRN: [id number] Date of Birth: 1975/07/04 Referring Provider (PT): Meridee Score, MD   Encounter Date: 10/12/2020   PT End of Session - 10/12/20 1654    Visit Number 9    Number of Visits 17   per updated POC   Date for PT Re-Evaluation 10/25/20   updated POC   Authorization Type [employer] and Medicaid (16 visits approved from 4/6-10/25/20))    Authorization - Visit Number 9    Authorization - Number of Visits 16    Progress Note Due on Visit 10    PT Start Time 1330    PT Stop Time 1415    PT Time Calculation (min) 45 min    Equipment Utilized During Treatment Other (comment)   bar bells, water walker, flotation belt, ankle cuff   Activity Tolerance Patient tolerated treatment well    Behavior During Therapy [facility] for tasks assessed/performed           Past Medical History:  Diagnosis Date  . Abdominal muscle pain 09/08/2016  . Abnormal Pap smear of cervix 2009  . Abscess    history of multiple abscesses  . Acute bilateral low back pain 02/13/2017  . Acute blood loss anemia   . Acute on chronic renal failure ([place]) 07/12/2012  . Acute renal failure ([place]) 07/12/2012  . Adjustment disorder with depressed mood 03/26/2017  . AKI (acute kidney injury) ([place])   . Anemia of chronic disease 2002  . Anxiety    Panic attacks  . Bilateral lower extremity edema 05/13/2016  . Bipolar disorder ([place])   . Cataract    B/L cataract  . Cellulitis 05/21/2014   right eye  . Chronic bronchitis ([place])    "get it q yr" (05/13/2013)  . Chronic pain   . Dehiscence of amputation stump ([place])   . Depression   . Edema of lower extremity   . Endocarditis 2002   subacute bacterial endocarditis.   . Family history of anesthesia complication    "my mom has a hard time coming  out from under"  . Fibromyalgia   . GERD (gastroesophageal reflux disease)    occ  . Heart murmur   . Herpes simplex type 1 infection 01/16/2018  . History of blood transfusion    "just low blood count" (05/13/2013)  . Hyperlipidemia   . Hypertension   . Hypothyroidism   . Hypothyroidism, adult 03/21/2014  . Hypoxia   . Leukocytosis   . Necrosis ([place])    and ulceration  . Necrotizing fasciitis s/p OR debridements 07/06/2012  . Obesity   . OSA on CPAP    does not wear all the time  . Peripheral neuropathy   . Pneumonia   . Type II diabetes mellitus (HCC)    Type  II  . Wound dehiscence 09/04/2019    Past Surgical History:  Procedure Laterality Date  . ABDOMINAL AORTOGRAM W/LOWER EXTREMITY Left 03/19/2019   Procedure: ABDOMINAL AORTOGRAM W/LOWER EXTREMITY;  Surgeon: Serafina Mitchell, MD;  Location: [facility];  Service: Cardiovascular;  Laterality: Left;  . ABOVE KNEE LEG AMPUTATION Left 10/11/2019   wound dehisence   . AIR/FLUID EXCHANGE Right 12/31/2019   Procedure: AIR/FLUID EXCHANGE;  Surgeon: Jalene Mullet, MD;  Location: [facility];  Service: Ophthalmology;  Laterality: Right;  . AMPUTATION Left  04/07/2017   Procedure: LEFT BELOW KNEE AMPUTATION;  Surgeon: Newt Minion, MD;  Location: [facility];  Service: Orthopedics;  Laterality: Left;  . AMPUTATION Left 10/11/2019   Procedure: LEFT ABOVE KNEE AMPUTATION;  Surgeon: Newt Minion, MD;  Location: [facility];  Service: Orthopedics;  Laterality: Left;  . CATARACT EXTRACTION W/PHACO Right 02/11/2020   Procedure: CATARACT EXTRACTION PHACO AND INTRAOCULAR LENS PLACEMENT (IOC);  Surgeon: Jalene Mullet, MD;  Location: [facility];  Service: Ophthalmology;  Laterality: Right;  . CORONARY ARTERY BYPASS GRAFT    . EYE SURGERY     lazer  . GAS INSERTION Right 12/31/2019   Procedure: INSERTION OF GAS;  Surgeon: Jalene Mullet, MD;  Location: [facility];  Service: Ophthalmology;  Laterality: Right;  . INCISION AND DRAINAGE ABSCESS     multiple  I&Ds  . INCISION AND DRAINAGE ABSCESS Left 07/09/2012   Procedure: DRESSING CHANGE, THIGH WOUND;  Surgeon: Harl Bowie, MD;  Location: [facility];  Service: General;  Laterality: Left;  . INCISION AND DRAINAGE OF WOUND Left 07/07/2012   Procedure: IRRIGATION AND DEBRIDEMENT WOUND;  Surgeon: Harl Bowie, MD;  Location: [facility];  Service: General;  Laterality: Left;  . INCISION AND DRAINAGE PERIRECTAL ABSCESS Left 07/14/2012   Procedure: DEBRIDEMENT OF SKIN & SOFT TISSUE; DRESSING CHANGE UNDER ANESTHESIA;  Surgeon: Gayland Curry, MD,FACS;  Location: [facility];  Service: General;  Laterality: Left;  . INCISION AND DRAINAGE PERIRECTAL ABSCESS Left 07/16/2012   Procedure: I&D Left Thigh;  Surgeon: Gwenyth Ober, MD;  Location: [facility];  Service: General;  Laterality: Left;  . INCISION AND DRAINAGE PERIRECTAL ABSCESS N/A 01/05/2015   Procedure: IRRIGATION AND DEBRIDEMENT PERIRECTAL ABSCESS;  Surgeon: Donnie Mesa, MD;  Location: [facility];  Service: General;  Laterality: N/A;  . IRRIGATION AND DEBRIDEMENT ABSCESS Left 07/06/2012   Procedure: IRRIGATION AND DEBRIDEMENT ABSCESS BUTTOCKS AND THIGH;  Surgeon: Shann Medal, MD;  Location: [facility];  Service: General;  Laterality: Left;  . IRRIGATION AND DEBRIDEMENT ABSCESS Left 08/10/2012   Procedure: IRRIGATION AND DEBRIDEMENT ABSCESS;  Surgeon: Madilyn Hook, DO;  Location: [facility];  Service: General;  Laterality: Left;  . PARS PLANA VITRECTOMY Right 12/31/2019   Procedure: PARS PLANA VITRECTOMY WITH 25 GAUGE;  Surgeon: Jalene Mullet, MD;  Location: [facility];  Service: Ophthalmology;  Laterality: Right;  . PHOTOCOAGULATION WITH LASER Right 12/31/2019   Procedure: PHOTOCOAGULATION WITH LASER;  Surgeon: Jalene Mullet, MD;  Location: [facility];  Service: Ophthalmology;  Laterality: Right;  . REPAIR OF COMPLEX TRACTION RETINAL DETACHMENT Right 12/31/2019   Procedure: REPAIR OF COMPLEX TRACTION RETINAL;  Surgeon: Jalene Mullet, MD;  Location: [facility];  Service: Ophthalmology;   Laterality: Right;  . STUMP REVISION Left 06/09/2017   Procedure: REVISION LEFT BELOW KNEE AMPUTATION;  Surgeon: Newt Minion, MD;  Location: [facility];  Service: Orthopedics;  Laterality: Left;  . STUMP REVISION Left 09/04/2019   Procedure: LEFT BELOW KNEE AMPUTATION REVISION;  Surgeon: Newt Minion, MD;  Location: [facility];  Service: Orthopedics;  Laterality: Left;  . STUMP REVISION Left 09/20/2019   Procedure: LEFT BELOW KNEE AMPUTATION REVISION;  Surgeon: Newt Minion, MD;  Location: [facility];  Service: Orthopedics;  Laterality: Left;  . TONSILLECTOMY  1994    There were no vitals filed for this visit.   Subjective Assessment - 10/12/20 1653    Subjective Pt reports having increased confidence in pool today and was nervous last session.    Limitations Walking;House hold activities;Standing  How long can you stand comfortably? 5 mins    How long can you walk comfortably? 5 mins    Patient Stated Goals "I want to be able to walk on this leg and move around"    Currently in Pain? No/denies    Pain Onset More than a month ago           Aqu[facility] therapy at [facility]  Patient seen for aquatic therapy today.  Treatment took place in water 3.6-4.8 feet deep depending upon activity.  Pt entered and exited the   the pool via hydraulic chair lift; pt transferred from wheelchair to chair lift with SBA and chair lift to w/c with CGA for safety.  Pt performed trunk strengthening/core stabilization exercises - seated on bench in pool - pt held dumb bells - performed shoulder protraction/retraction 20 reps; shoulder horizontal abduction/adduction 20 reps; trunk rotation to Rt and Lt sides holding pool noodle with elbows extended 20 reps to each side;  Pt able to perform without leaning against pool wall.  Pt gait trained in 4.5-4.8 ft and  floatation belt was added for increased buoyancy ;pt gait trained 18' x 4 reps across pool with CGA with water walker then 18' x 4 reps with pool noodle.    Pt  facing pool wall and leaning with bil UE's supporting body weight.  Pt performed R hip extension x 15 reps, 15 reps hip abd then repeated same on L side.  Used bar bells for resisted standing balance - horizontal abduction/adduction 15 reps; bil. Shoulder protraction/retraction 15 reps and trunk rotation 10 reps with min assist x 1 otherwise SBA-CGA of PTA for recovery or LOB  Pt performed RLE strengthening - seated on bench in pool - LAQ's 2 sets 15 reps with pt using bil. UE's to maintain balance; hip abduction/adduction  RLE and LLE 2 sets 15 reps.  Leaning back against pool wall and performed bil LE bicycling x 40 reps.  Pt in supine position - used noodle under arms and floatation belt; bicycling LE's approx. 30' x 4 reps (length of pool); hip abduction/adduction performed 30' x 4 reps   Pt requires buoyancy of water for joint off loading for reduced pain with weight bearing on RLE - pt reported decreased pain in water than that experienced with weight bearing on land.  Buoyancy resisted exercises beneficial for Lt hip flexor stretching and strengthening.  Buoyancy needed for support and for safety for reduced fall risk with activities in standing and with gait training.  Viscosity of water needed for resistance for strengthening of trunk musc. for improved core stabilization and for LE strengthening.      PT Short Term Goals - 10/08/20 1331      PT SHORT TERM GOAL #1   Title Pt will be independent with ongoing standing HEP with prosthesis.   (Target Date: 09/25/20)    Baseline met per pt reporting    Time 4    Period Weeks    Status Achieved    Target Date 09/25/20      PT SHORT TERM GOAL #2   Title Pt will be able to tolerate wearing prosthesis 5 hrs, 2x/day with no skin issues and with good understanding of swelling management.    Baseline wearing 4 hrs, 2x/day consistently, educated to increase to 5hrs, 2x/day    Time 4    Period Weeks    Status Partially Met      PT SHORT  TERM GOAL #3   Title  Pt will  perform dynamic tasks with intermittent UE support x 10 mins at S level and reach to floor level to retrieve item.    Baseline Stood/ambulated x 11 mins total performing multiple standing balance tasks and reaching to floor with single UE support to retrieve item.    Time 4    Period Weeks    Status Achieved      PT SHORT TERM GOAL #4   Title Pt will ambulate x 300' with RW at mod I level without rest break in order to indicate improved activity tolerance.    Baseline met at mostly mod I level, min cues for upright posture    Time 4    Period Weeks    Status Partially Met      PT SHORT TERM GOAL #5   Title Pt will improve gait speed to >/=1.44 ft/sec w/ RW and prosthesis to indicate dec fall risk.    Baseline 1.60 ft/sec with RW and prosthesis    Time 4    Period Weeks    Status Achieved             PT Long Term Goals - 10/08/20 1331      PT LONG TERM GOAL #1   Title Pt will be independent with final HEP in order to indicate improved functional mobility and balance with use of prosthesis.  (Target Date: 10/25/20)    Time 8    Period Weeks    Status New      PT LONG TERM GOAL #2   Title Pt will be able to tolerate wearing for 90% of awake hours without skin breakdown and with good understanding of swelling mangement.    Baseline 4hrs, 2x/day, still having some swelling    Time 8    Period Weeks    Status New      PT LONG TERM GOAL #3   Title Pt will tolerate standing x 15 mins while performing tasks intermittently without UE support and pick up item from floor with single UE support at mod I level.    Baseline able to stand 5 mins at a time    Time 8    Period Weeks    Status New      PT LONG TERM GOAL #4   Title Pt will improve gait speed to >/=2.00 ft/sec with LRAD and prosthesis to indicate dec fall risk.    Baseline .37 ft/sec with wide RW    Time 8    Period Weeks    Status New      PT LONG TERM GOAL #5   Title Pt will ambulate x  25' w/ SBQC (or LRAD) and prosthesis at min A level in order to indicate improved independence with gait.    Baseline 10' at min A level    Time 8    Period Weeks    Status New      PT LONG TERM GOAL #6   Title Pt will negotiate up/down 3 steps with single rail and crutch (or LRAD) at S level in order to enter/exit home safely.    Time 8    Period Weeks    Status New                 Plan - 10/12/20 1655    Clinical Impression Statement Pt with improved trunk control and standing balance during session today.  Pt continue to c/o R knee discomfort.  Cont per poc.  Personal Factors and Comorbidities Comorbidity 3+;Past/Current Experience;Time since onset of injury/illness/exacerbation    Comorbidities see above    Examination-Activity Limitations Bend;Carry;Dressing;Stand;Stairs;Squat;Locomotion Level;Transfers    Examination-Participation Restrictions Community Activity;Meal Prep;Shop;Laundry    Stability/Clinical Decision Making Evolving/Moderate complexity    Rehab Potential Good    PT Frequency 2x / week    PT Duration 8 weeks    PT Treatment/Interventions ADLs/Self Care Home Management;DME Instruction;Gait training;Stair training;Functional mobility training;Therapeutic activities;Therapeutic exercise;Balance training;Neuromuscular re-education;Patient/family education;Prosthetic Training;Compression bandaging;Passive range of motion;Energy conservation;Iontophoresis 46m/ml Dexamethasone;Cryotherapy;Aquatic Therapy    PT Next Visit Plan Update HEP as needed based on what she is doing at home already,  Work on her activating knee/feeling comfortable being on prosthesis, balance, endurance    PT Home Exercise Plan We discussed doing aquatic PT 1x/wk for a month (4 weeks) and recerting for another 4-6 weeks with her then working at home on endurance and strength to return in about sept/oct to work on getting on cane.  She is good with this plan.    Consulted and Agree with Plan of  Care Patient           Patient will benefit from skilled therapeutic intervention in order to improve the following deficits and impairments:  Abnormal gait,Decreased activity tolerance,Decreased balance,Decreased endurance,Decreased knowledge of precautions,Decreased knowledge of use of DME,Decreased mobility,Decreased range of motion,Decreased strength,Difficulty walking,Increased edema,Impaired perceived functional ability,Impaired flexibility,Impaired sensation,Postural dysfunction,Improper body mechanics,Prosthetic Dependency,Pain  Visit Diagnosis: Unsteadiness on feet  Other abnormalities of gait and mobility     Problem List Patient Active Problem List   Diagnosis Date Noted  . Cough 05/26/2020  . Chronic pain syndrome 01/16/2020  . Hx of AKA (above knee amputation), left ([place]   . Symptomatic anemia 10/21/2019  . Anemia 10/21/2019  . Acute respiratory failure with hypoxia ([place] 10/21/2019  . Blurry vision, right eye 10/08/2019  . Tobacco use 03/01/2019  . Phantom limb pain ([place] 11/08/2018  . Current moderate episode of major depressive disorder ([place]   . Hyperkalemia   . Anemia of chronic disease   . Severe protein-calorie malnutrition ([place]   . Fall   . Status post below knee amputation, left ([place] 04/11/2017  . Bipolar affective disorder ([place]   . Heart failure with preserved ejection fraction ([place] 02/09/2017  . Routine adult health maintenance 12/08/2016  . Chronic pain of right knee 09/08/2016  . Chronic kidney disease (CKD), stage III (moderate) (HH[place] 07/15/2016  . OSA (obstructive sleep apnea) 05/13/2016  . Vaginal irritation 03/14/2016  . Morbid obesity due to excess calories (HT[place] 11/27/2015  . Subclinical hypothyroidism 04/25/2013  . Controlled type 2 diabetes mellitus ([place] 07/12/2012  . Carpal tunnel syndrome, bilateral 01/25/2012  . Diabetic polyneuropathy associated with type 2 diabetes mellitus ([place] 07/04/2011  . Anxiety and depression 06/02/2011   . GERD 12/06/2007  . Hyperlipidemia 08/29/2007  . Hypertension associated with diabetes ([place] 08/29/2007   DNarda Bonds PTA CRatliff City05/23/22 5:05 PM Phone: [phone]: [phone]  10/12/2020, 4:57 PM  [facility]SWood VillageGSummers [place]Al[place] [address]Phone: [phone]  Fax:  [phone] Name: SZURISADAI HELMINIAKMRN: [id number] of Birth: [birth year]

## 2020-10-14 ENCOUNTER — Telehealth: Payer: Self-pay

## 2020-10-14 ENCOUNTER — Ambulatory Visit: Payer: Medicare HMO | Admitting: Physical Therapy

## 2020-10-14 NOTE — Telephone Encounter (Signed)
Returned call to [employer] at [facility]. States she didn't realize we had refilled 4 meds yesterday. She will get them ready now. Nothing further needed.

## 2020-10-14 NOTE — Telephone Encounter (Signed)
Joy with [facility] requesting to speak with a nurse about  oxyCODONE-acetaminophen (PERCOCET/ROXICET) 5-325 MG tablet. Please call back.

## 2020-10-20 ENCOUNTER — Other Ambulatory Visit: Payer: Self-pay

## 2020-10-20 ENCOUNTER — Ambulatory Visit: Payer: Medicare HMO | Admitting: Physical Therapy

## 2020-10-20 ENCOUNTER — Encounter: Payer: Self-pay | Admitting: Physical Therapy

## 2020-10-20 DIAGNOSIS — R2681 Unsteadiness on feet: Secondary | ICD-10-CM | POA: Diagnosis not present

## 2020-10-20 DIAGNOSIS — M25652 Stiffness of left hip, not elsewhere classified: Secondary | ICD-10-CM | POA: Diagnosis not present

## 2020-10-20 DIAGNOSIS — R293 Abnormal posture: Secondary | ICD-10-CM | POA: Diagnosis not present

## 2020-10-20 DIAGNOSIS — R2689 Other abnormalities of gait and mobility: Secondary | ICD-10-CM

## 2020-10-20 DIAGNOSIS — R296 Repeated falls: Secondary | ICD-10-CM | POA: Diagnosis not present

## 2020-10-20 NOTE — Therapy (Signed)
[facility] [address] [place], [place], [address] Phone: [phone]   Fax:  [phone]  Physical Therapy Treatment  Patient Details  Name: [name] [name] MRN: [id number] Date of Birth: 29-Oct-1975 Referring Provider (PT): Meridee Score, MD   Encounter Date: 10/20/2020   PT End of Session - 10/20/20 1351    Visit Number 10    Number of Visits 17   per updated POC   Date for PT Re-Evaluation 10/25/20   updated POC   Authorization Type [employer] [place] and Medicaid (16 visits approved from 4/6-10/25/20))    Authorization - Visit Number 10    Authorization - Number of Visits 16    Progress Note Due on Visit 10    PT Start Time 9357    PT Stop Time 1100    PT Time Calculation (min) 45 min    Equipment Utilized During Treatment Other (comment)   bar bells, water walker, flotation belt, ankle cuff   Activity Tolerance Patient tolerated treatment well    Behavior During Therapy [facility] for tasks assessed/performed           Past Medical History:  Diagnosis Date  . Abdominal muscle pain 09/08/2016  . Abnormal Pap smear of cervix 2009  . Abscess    history of multiple abscesses  . Acute bilateral low back pain 02/13/2017  . Acute blood loss anemia   . Acute on chronic renal failure ([place]) 07/12/2012  . Acute renal failure ([place]) 07/12/2012  . Adjustment disorder with depressed mood 03/26/2017  . AKI (acute kidney injury) ([place])   . Anemia of chronic disease 2002  . Anxiety    Panic attacks  . Bilateral lower extremity edema 05/13/2016  . Bipolar disorder ([place])   . Cataract    B/L cataract  . Cellulitis 05/21/2014   right eye  . Chronic bronchitis ([place])    "get it q yr" (05/13/2013)  . Chronic pain   . Dehiscence of amputation stump ([place])   . Depression   . Edema of lower extremity   . Endocarditis 2002   subacute bacterial endocarditis.   . Family history of anesthesia complication    "my mom has a hard time coming  out from under"  . Fibromyalgia   . GERD (gastroesophageal reflux disease)    occ  . Heart murmur   . Herpes simplex type 1 infection 01/16/2018  . History of blood transfusion    "just low blood count" (05/13/2013)  . Hyperlipidemia   . Hypertension   . Hypothyroidism   . Hypothyroidism, adult 03/21/2014  . Hypoxia   . Leukocytosis   . Necrosis ([place])    and ulceration  . Necrotizing fasciitis s/p OR debridements 07/06/2012  . Obesity   . OSA on CPAP    does not wear all the time  . Peripheral neuropathy   . Pneumonia   . Type II diabetes mellitus (HCC)    Type  II  . Wound dehiscence 09/04/2019    Past Surgical History:  Procedure Laterality Date  . ABDOMINAL AORTOGRAM W/LOWER EXTREMITY Left 03/19/2019   Procedure: ABDOMINAL AORTOGRAM W/LOWER EXTREMITY;  Surgeon: Serafina Mitchell, MD;  Location: [facility];  Service: Cardiovascular;  Laterality: Left;  . ABOVE KNEE LEG AMPUTATION Left 10/11/2019   wound dehisence   . AIR/FLUID EXCHANGE Right 12/31/2019   Procedure: AIR/FLUID EXCHANGE;  Surgeon: Jalene Mullet, MD;  Location: [facility];  Service: Ophthalmology;  Laterality: Right;  . AMPUTATION Left  04/07/2017   Procedure: LEFT BELOW KNEE AMPUTATION;  Surgeon: Newt Minion, MD;  Location: [facility];  Service: Orthopedics;  Laterality: Left;  . AMPUTATION Left 10/11/2019   Procedure: LEFT ABOVE KNEE AMPUTATION;  Surgeon: Newt Minion, MD;  Location: [facility];  Service: Orthopedics;  Laterality: Left;  . CATARACT EXTRACTION W/PHACO Right 02/11/2020   Procedure: CATARACT EXTRACTION PHACO AND INTRAOCULAR LENS PLACEMENT (IOC);  Surgeon: Jalene Mullet, MD;  Location: [facility];  Service: Ophthalmology;  Laterality: Right;  . CORONARY ARTERY BYPASS GRAFT    . EYE SURGERY     [place]  . GAS INSERTION Right 12/31/2019   Procedure: INSERTION OF GAS;  Surgeon: Jalene Mullet, MD;  Location: [facility];  Service: Ophthalmology;  Laterality: Right;  . INCISION AND DRAINAGE ABSCESS     multiple  I&Ds  . INCISION AND DRAINAGE ABSCESS Left 07/09/2012   Procedure: DRESSING CHANGE, THIGH WOUND;  Surgeon: Harl Bowie, MD;  Location: [facility];  Service: General;  Laterality: Left;  . INCISION AND DRAINAGE OF WOUND Left 07/07/2012   Procedure: IRRIGATION AND DEBRIDEMENT WOUND;  Surgeon: Harl Bowie, MD;  Location: [facility];  Service: General;  Laterality: Left;  . INCISION AND DRAINAGE PERIRECTAL ABSCESS Left 07/14/2012   Procedure: DEBRIDEMENT OF SKIN & SOFT TISSUE; DRESSING CHANGE UNDER ANESTHESIA;  Surgeon: Gayland Curry, MD,FACS;  Location: [facility];  Service: General;  Laterality: Left;  . INCISION AND DRAINAGE PERIRECTAL ABSCESS Left 07/16/2012   Procedure: I&D Left Thigh;  Surgeon: Gwenyth Ober, MD;  Location: [facility];  Service: General;  Laterality: Left;  . INCISION AND DRAINAGE PERIRECTAL ABSCESS N/A 01/05/2015   Procedure: IRRIGATION AND DEBRIDEMENT PERIRECTAL ABSCESS;  Surgeon: Donnie Mesa, MD;  Location: [facility];  Service: General;  Laterality: N/A;  . IRRIGATION AND DEBRIDEMENT ABSCESS Left 07/06/2012   Procedure: IRRIGATION AND DEBRIDEMENT ABSCESS BUTTOCKS AND THIGH;  Surgeon: Shann Medal, MD;  Location: [facility];  Service: General;  Laterality: Left;  . IRRIGATION AND DEBRIDEMENT ABSCESS Left 08/10/2012   Procedure: IRRIGATION AND DEBRIDEMENT ABSCESS;  Surgeon: Madilyn Hook, DO;  Location: [place];  Service: General;  Laterality: Left;  . PARS PLANA VITRECTOMY Right 12/31/2019   Procedure: PARS PLANA VITRECTOMY WITH 25 GAUGE;  Surgeon: Jalene Mullet, MD;  Location: [facility];  Service: Ophthalmology;  Laterality: Right;  . PHOTOCOAGULATION WITH LASER Right 12/31/2019   Procedure: PHOTOCOAGULATION WITH LASER;  Surgeon: Jalene Mullet, MD;  Location: [facility];  Service: Ophthalmology;  Laterality: Right;  . REPAIR OF COMPLEX TRACTION RETINAL DETACHMENT Right 12/31/2019   Procedure: REPAIR OF COMPLEX TRACTION RETINAL;  Surgeon: Jalene Mullet, MD;  Location: [facility];  Service: Ophthalmology;   Laterality: Right;  . STUMP REVISION Left 06/09/2017   Procedure: REVISION LEFT BELOW KNEE AMPUTATION;  Surgeon: Newt Minion, MD;  Location: [facility];  Service: Orthopedics;  Laterality: Left;  . STUMP REVISION Left 09/04/2019   Procedure: LEFT BELOW KNEE AMPUTATION REVISION;  Surgeon: Newt Minion, MD;  Location: [facility];  Service: Orthopedics;  Laterality: Left;  . STUMP REVISION Left 09/20/2019   Procedure: LEFT BELOW KNEE AMPUTATION REVISION;  Surgeon: Newt Minion, MD;  Location: [facility];  Service: Orthopedics;  Laterality: Left;  . TONSILLECTOMY  1994    There were no vitals filed for this visit.   Subjective Assessment - 10/20/20 1349    Subjective Denies any falls.  Has been walking around house some with cane.  Had injection in R knee.  Limitations Walking;House hold activities;Standing    How long can you stand comfortably? 5 mins    How long can you walk comfortably? 5 mins    Patient Stated Goals "I want to be able to walk on this leg and move around"    Currently in Pain? Yes    Pain Score 4     Pain Location Knee    Pain Orientation Right    Pain Descriptors / Indicators Aching    Pain Type Chronic pain    Pain Onset More than a month ago    Pain Frequency Intermittent    Aggravating Factors  weight bearing           Aquatic therapy at [facility] Patient seen for aquatic therapy today. Treatment took place in water 3.6-4.58fet deep depending upon activity. Pt entered and exited the the pool via hydraulic chair lift; pt transferred from wheelchair to chair lift with SBA and chair lift to w/c with CGA for safety.  Pt performed trunk strengthening/core stabilization exercises - seated on bench in pool - pt held pool noodle - performed shoulder protraction/retraction 20 reps; shoulder horizontal abduction/adduction 20 reps; trunk rotation to Rt and Lt sides holding pool noodle with elbows extended 20 reps to each side; Pt able to perform without leaning against  pool wall.  Pt gait trained in 4.5-4.8 ft and water walker  Pt gait trained 18' x 6 reps across pool with CGA with water walker.    Pt facing pool wall and leaning with bil UE's supporting body weight.  Pt performed R hip extension x 20 reps, 20 reps hip abd then repeated same on L side.  Used pool noodle for resisted standing balance - bil. Shoulder protraction/retraction 20 reps with SBA-CGA of PTA.  Stood without UE assist for static balance x 90 sec x 2 reps  Pt performed RLE strengthening with ankle buoyancy cuff- seated on bench in pool - LAQ's 2 sets 20 reps with pt using bil. UE's to maintain balance; hip abduction/adduction RLE and LLE 2 sets 15 reps.  Leaning back against pool wall and performed bil LE bicycling x 40 reps.  Pt in supine position - used noodle under arms and floatation belt; bicycling LE's approx. 30' x 4 reps (length of pool); hip abduction/adduction performed 30' x 4 reps   Pt requires buoyancy of water for joint off loading for reduced pain with weight bearing on RLE - pt reported decreased pain in water than that experienced with weight bearing on land. Buoyancy resisted exercises beneficial for Lt hip flexor stretching and strengthening. Buoyancy needed for support and for safety for reduced fall risk with activities in standing and with gait training. Viscosity of water needed for resistance for strengthening of trunk musc. for improved core stabilization and for LE strengthening.     PT Short Term Goals - 10/08/20 1331      PT SHORT TERM GOAL #1   Title Pt will be independent with ongoing standing HEP with prosthesis.   (Target Date: 09/25/20)    Baseline met per pt reporting    Time 4    Period Weeks    Status Achieved    Target Date 09/25/20      PT SHORT TERM GOAL #2   Title Pt will be able to tolerate wearing prosthesis 5 hrs, 2x/day with no skin issues and with good understanding of swelling management.    Baseline wearing 4 hrs, 2x/day  consistently, educated to increase to 5hrs, 2x/day  Time 4    Period Weeks    Status Partially Met      PT SHORT TERM GOAL #3   Title Pt will  perform dynamic tasks with intermittent UE support x 10 mins at S level and reach to floor level to retrieve item.    Baseline Stood/ambulated x 11 mins total performing multiple standing balance tasks and reaching to floor with single UE support to retrieve item.    Time 4    Period Weeks    Status Achieved      PT SHORT TERM GOAL #4   Title Pt will ambulate x 300' with RW at mod I level without rest break in order to indicate improved activity tolerance.    Baseline met at mostly mod I level, min cues for upright posture    Time 4    Period Weeks    Status Partially Met      PT SHORT TERM GOAL #5   Title Pt will improve gait speed to >/=1.44 ft/sec w/ RW and prosthesis to indicate dec fall risk.    Baseline 1.60 ft/sec with RW and prosthesis    Time 4    Period Weeks    Status Achieved             PT Long Term Goals - 10/08/20 1331      PT LONG TERM GOAL #1   Title Pt will be independent with final HEP in order to indicate improved functional mobility and balance with use of prosthesis.  (Target Date: 10/25/20)    Time 8    Period Weeks    Status New      PT LONG TERM GOAL #2   Title Pt will be able to tolerate wearing for 90% of awake hours without skin breakdown and with good understanding of swelling mangement.    Baseline 4hrs, 2x/day, still having some swelling    Time 8    Period Weeks    Status New      PT LONG TERM GOAL #3   Title Pt will tolerate standing x 15 mins while performing tasks intermittently without UE support and pick up item from floor with single UE support at mod I level.    Baseline able to stand 5 mins at a time    Time 8    Period Weeks    Status New      PT LONG TERM GOAL #4   Title Pt will improve gait speed to >/=2.00 ft/sec with LRAD and prosthesis to indicate dec fall risk.    Baseline  .105 ft/sec with wide RW    Time 8    Period Weeks    Status New      PT LONG TERM GOAL #5   Title Pt will ambulate x 25' w/ SBQC (or LRAD) and prosthesis at min A level in order to indicate improved independence with gait.    Baseline 10' at min A level    Time 8    Period Weeks    Status New      PT LONG TERM GOAL #6   Title Pt will negotiate up/down 3 steps with single rail and crutch (or LRAD) at S level in order to enter/exit home safely.    Time 8    Period Weeks    Status New                 Plan - 10/20/20 1351    Clinical Impression Statement  Pt continues with gradual improvement in balance with aquatic activities. Able to stand without UE support for increased time today.  Cont per poc.    Personal Factors and Comorbidities Comorbidity 3+;Past/Current Experience;Time since onset of injury/illness/exacerbation    Comorbidities see above    Examination-Activity Limitations Bend;Carry;Dressing;Stand;Stairs;Squat;Locomotion Level;Transfers    Examination-Participation Restrictions Community Activity;Meal Prep;Shop;Laundry    Stability/Clinical Decision Making Evolving/Moderate complexity    Rehab Potential Good    PT Frequency 2x / week    PT Duration 8 weeks    PT Treatment/Interventions ADLs/Self Care Home Management;DME Instruction;Gait training;Stair training;Functional mobility training;Therapeutic activities;Therapeutic exercise;Balance training;Neuromuscular re-education;Patient/family education;Prosthetic Training;Compression bandaging;Passive range of motion;Energy conservation;Iontophoresis 75m/ml Dexamethasone;Cryotherapy;Aquatic Therapy    PT Next Visit Plan Update HEP as needed based on what she is doing at home already,  Work on her activating knee/feeling comfortable being on prosthesis, balance, endurance    PT Home Exercise Plan We discussed doing aquatic PT 1x/wk for a month (4 weeks) and recerting for another 4-6 weeks with her then working at home on  endurance and strength to return in about sept/oct to work on getting on cane.  She is good with this plan.    Consulted and Agree with Plan of Care Patient           Patient will benefit from skilled therapeutic intervention in order to improve the following deficits and impairments:  Abnormal gait,Decreased activity tolerance,Decreased balance,Decreased endurance,Decreased knowledge of precautions,Decreased knowledge of use of DME,Decreased mobility,Decreased range of motion,Decreased strength,Difficulty walking,Increased edema,Impaired perceived functional ability,Impaired flexibility,Impaired sensation,Postural dysfunction,Improper body mechanics,Prosthetic Dependency,Pain  Visit Diagnosis: Unsteadiness on feet  Other abnormalities of gait and mobility     Problem List Patient Active Problem List   Diagnosis Date Noted  . Cough 05/26/2020  . Chronic pain syndrome 01/16/2020  . Hx of AKA (above knee amputation), left ([place]   . Symptomatic anemia 10/21/2019  . Anemia 10/21/2019  . Acute respiratory failure with hypoxia ([place] 10/21/2019  . Blurry vision, right eye 10/08/2019  . Tobacco use 03/01/2019  . Phantom limb pain ([place] 11/08/2018  . Current moderate episode of major depressive disorder ([facility]   . Hyperkalemia   . Anemia of chronic disease   . Severe protein-calorie malnutrition ([facility] [facility]   . Fall   . Status post below knee amputation, left ([place] 04/11/2017  . Bipolar affective disorder ([place]   . Heart failure with preserved ejection fraction ([facility] 02/09/2017  . Routine adult health maintenance 12/08/2016  . Chronic pain of right knee 09/08/2016  . Chronic kidney disease (CKD), stage III (moderate) ([place] 07/15/2016  . OSA (obstructive sleep apnea) 05/13/2016  . Vaginal irritation 03/14/2016  . Morbid obesity due to excess calories ([facility] 11/27/2015  . Subclinical hypothyroidism 04/25/2013  . Controlled type 2 diabetes mellitus ([place] [place] 07/12/2012  . Carpal tunnel syndrome,  bilateral 01/25/2012  . Diabetic polyneuropathy associated with type 2 diabetes mellitus ([place] 07/04/2011  . Anxiety and depression 06/02/2011  . GERD 12/06/2007  . Hyperlipidemia 08/29/2007  . Hypertension associated with diabetes ([place] 08/29/2007    DNarda Bonds PTA CSnohomish05/31/22 1:56 PM Phone: [phone]: 3[facility] [place]SM[address] [address]Phone: [phone]  Fax:  [phone] Name: [name] GEYERMRN: [id number] of Birth: [birth year]

## 2020-10-21 ENCOUNTER — Ambulatory Visit: Payer: Medicare HMO

## 2020-10-26 ENCOUNTER — Other Ambulatory Visit: Payer: Self-pay

## 2020-10-26 ENCOUNTER — Ambulatory Visit: Payer: Medicare HMO | Admitting: Physical Therapy

## 2020-10-26 ENCOUNTER — Ambulatory Visit: Payer: Medicare HMO | Admitting: Behavioral Health

## 2020-10-26 DIAGNOSIS — F419 Anxiety disorder, unspecified: Secondary | ICD-10-CM

## 2020-10-26 DIAGNOSIS — F331 Major depressive disorder, recurrent, moderate: Secondary | ICD-10-CM

## 2020-10-26 DIAGNOSIS — F4381 Prolonged grief disorder: Secondary | ICD-10-CM

## 2020-10-26 DIAGNOSIS — F4329 Adjustment disorder with other symptoms: Secondary | ICD-10-CM

## 2020-10-26 DIAGNOSIS — Z89512 Acquired absence of left leg below knee: Secondary | ICD-10-CM | POA: Diagnosis not present

## 2020-10-26 NOTE — BH Specialist Note (Signed)
Integrated Behavioral Health via Telemedicine Visit  10/26/2020 [name] [name] [id number]  Number of [place] visits: 1/6 Session Start time: 1:45pm  Session End time: 2:00PM Total time: 15  Referring Provider: Dr. Mardene Celeste, MD Patient/Family location: Pt is home in private [facility] Provider location: [facility] Office All persons participating in visit: Pt & Clinician Types of Service: Introduction only  I connected with Stephannie C Landing and/or Stephannie C Arif's self via  Telephone or Video Enabled Telemedicine Application  (Video is Caregility application) and verified that I am speaking with the correct person using two identifiers. Discussed confidentiality: Intro Only  I discussed the limitations of telemedicine and the availability of in person appointments.  Discussed there is a possibility of technology failure and discussed alternative modes of communication if that failure occurs.  I discussed that engaging in this telemedicine visit, they consent to the provision of behavioral healthcare and the services will be billed under their insurance.  Patient and/or legal guardian expressed understanding and consented to Telemedicine visit: Intro Only  Presenting Concerns: Patient and/or family reports the following symptoms/concerns: elevated anx/dep & grief rxn Duration of problem: since 2018; Severity of problem: moderate  Patient and/or Family's Strengths/Protective Factors: Social connections, Social and Emotional competence, Concrete supports in place (healthy food, safe environments, etc.), Sense of purpose, Physical Health (exercise, healthy diet, medication compliance, etc.), Caregiver has knowledge of parenting & child development and Parental Resilience  Goals Addressed: Patient will: 1.  Reduce symptoms of: anxiety, depression, stress and grief  2.  Increase knowledge and/or ability of: coping skills, stress reduction and processing of grief   3.  Demonstrate ability to: Increase healthy adjustment to current life circumstances, Increase adequate support systems for patient/family and Begin healthy grieving over loss  Progress towards Goals: Estb'd today: Pt will meet w/Clinician to process multiple losses since 2018  Interventions: Interventions utilized:  Supportive Counseling and Intro to [facility] services Standardized Assessments completed: screeners prn  Patient and/or Family Response: Pt receptive to call & requests future appt   Assessment: Patient currently experiencing elevated anx/dep & grief. Pt's chosen Dtr has been acting out & she needs support for Dtr's beh & changing F dynamics since death of her Mother & Fiance in 2018.  Patient may benefit from support for Parenting, dealing w/losses & changing Fa dynamics.  Plan: 1. Follow up with behavioral health clinician on : 2-3 wks on telehealth for 60 min 2. Behavioral recommendations: Look for my letter in the mail. Keep our next appt.  3. Referral(s): [facility] (In Clinic)  I discussed the assessment and treatment plan with the patient and/or parent/guardian. They were provided an opportunity to ask questions and all were answered. They agreed with the plan and demonstrated an understanding of the instructions.   They were advised to call back or seek an in-person evaluation if the symptoms worsen or if the condition fails to improve as anticipated.  [name], LMFT

## 2020-10-27 DIAGNOSIS — Z89512 Acquired absence of left leg below knee: Secondary | ICD-10-CM | POA: Diagnosis not present

## 2020-10-28 ENCOUNTER — Ambulatory Visit: Payer: Medicare HMO | Attending: Orthopedic Surgery

## 2020-10-28 DIAGNOSIS — R2681 Unsteadiness on feet: Secondary | ICD-10-CM | POA: Insufficient documentation

## 2020-10-28 DIAGNOSIS — R293 Abnormal posture: Secondary | ICD-10-CM | POA: Insufficient documentation

## 2020-10-28 DIAGNOSIS — R296 Repeated falls: Secondary | ICD-10-CM | POA: Insufficient documentation

## 2020-10-28 DIAGNOSIS — R2689 Other abnormalities of gait and mobility: Secondary | ICD-10-CM | POA: Insufficient documentation

## 2020-11-02 ENCOUNTER — Encounter: Payer: Self-pay | Admitting: *Deleted

## 2020-11-03 ENCOUNTER — Ambulatory Visit: Payer: Medicare HMO

## 2020-11-05 ENCOUNTER — Ambulatory Visit: Payer: Medicare HMO

## 2020-11-09 ENCOUNTER — Ambulatory Visit: Payer: Medicare HMO | Admitting: Physical Therapy

## 2020-11-09 ENCOUNTER — Ambulatory Visit: Payer: Medicare HMO | Admitting: Orthopedic Surgery

## 2020-11-11 ENCOUNTER — Other Ambulatory Visit: Payer: Self-pay | Admitting: Internal Medicine

## 2020-11-11 ENCOUNTER — Ambulatory Visit: Payer: Medicare HMO | Admitting: Behavioral Health

## 2020-11-11 ENCOUNTER — Other Ambulatory Visit: Payer: Self-pay | Admitting: Student

## 2020-11-11 DIAGNOSIS — Z638 Other specified problems related to primary support group: Secondary | ICD-10-CM

## 2020-11-11 DIAGNOSIS — G894 Chronic pain syndrome: Secondary | ICD-10-CM

## 2020-11-11 DIAGNOSIS — F331 Major depressive disorder, recurrent, moderate: Secondary | ICD-10-CM

## 2020-11-11 DIAGNOSIS — F419 Anxiety disorder, unspecified: Secondary | ICD-10-CM

## 2020-11-11 NOTE — BH Specialist Note (Signed)
Integrated Behavioral Health via Telemedicine Visit  11/11/2020 [name] [name] [id number]  Number of [place] visits: 2/6 Session Start time: 3:00pm  Session End time: 3:45pm Total time: 5   Referring Provider: Dr. Dawna Part, MD Patient/Family location: Pt @ home in private [facility] Provider location: [facility] Office All persons participating in visit: Pt & Clinician Types of Service: Individual psychotherapy  I connected with [name] [name] and/or [name] [name]'s  self  via  Telephone or Video Enabled Telemedicine Application  (Video is Caregility application) and verified that I am speaking with the correct person using two identifiers. Discussed confidentiality:  n/a  I discussed the limitations of telemedicine and the availability of in person appointments.  Discussed there is a possibility of technology failure and discussed alternative modes of communication if that failure occurs.  I discussed that engaging in this telemedicine visit, they consent to the provision of behavioral healthcare and the services will be billed under their insurance.  Patient and/or legal guardian expressed understanding and consented to Telemedicine visit:  n/a  Presenting Concerns: Patient and/or family reports the following symptoms/concerns: elevated Parenting anxiety due to God Dtr's recent beh/decisions Duration of problem: 2 wks; Severity of problem: moderate  Patient and/or Family's Strengths/Protective Factors: Social and Emotional competence, Concrete supports in place (healthy food, safe environments, etc.), and Sense of purpose  Goals Addressed: Patient will:  Reduce symptoms of: anxiety, depression, and stress   Increase knowledge and/or ability of: coping skills, stress reduction, and Family dynamic stressors     Demonstrate ability to: Increase healthy adjustment to current life circumstances  Progress towards  Goals: Ongoing  Interventions: Interventions utilized:  Solution-Focused Strategies, Supportive Counseling, Psychoeducation and/or Health Education, and Parenting guidance & Family dynamic psychoedu Standardized Assessments completed:  screeners prn  Patient and/or Family Response: Pt receptive to call & requests future appt  Assessment: Patient currently experiencing inc'd anxiety due to her G_d Dtr's current situation staying w/her Bio-Mother who has serious mental health issues & lost custody of her Dtr near birth.  Patient may benefit from cont'd ck-in calls for support & encouragement for the stressors she is facing.  Plan: Follow up with behavioral health clinician on : 2-3 wks on telehealth for 30 min Behavioral recommendations: Journal btwn sessions to focus our work & process your feelings Referral(s): [place] [facility] (In Clinic)  I discussed the assessment and treatment plan with the patient and/or parent/guardian. They were provided an opportunity to ask questions and all were answered. They agreed with the plan and demonstrated an understanding of the instructions.   They were advised to call back or seek an in-person evaluation if the symptoms worsen or if the condition fails to improve as anticipated.  [name] Hutching, LMFT

## 2020-11-11 NOTE — Telephone Encounter (Signed)
Last rx written 10/13/20. Last OV 10/02/20. Next OV- has not been scheduled. UDS 02/13/20.

## 2020-11-12 ENCOUNTER — Ambulatory Visit: Payer: Medicare HMO

## 2020-11-12 ENCOUNTER — Other Ambulatory Visit: Payer: Self-pay

## 2020-11-16 ENCOUNTER — Ambulatory Visit: Payer: Medicare HMO | Admitting: Physical Therapy

## 2020-11-19 ENCOUNTER — Ambulatory Visit: Payer: Medicare HMO

## 2020-11-19 ENCOUNTER — Other Ambulatory Visit: Payer: Self-pay

## 2020-11-19 DIAGNOSIS — R293 Abnormal posture: Secondary | ICD-10-CM

## 2020-11-19 DIAGNOSIS — R2689 Other abnormalities of gait and mobility: Secondary | ICD-10-CM

## 2020-11-19 DIAGNOSIS — R2681 Unsteadiness on feet: Secondary | ICD-10-CM | POA: Diagnosis not present

## 2020-11-19 DIAGNOSIS — R296 Repeated falls: Secondary | ICD-10-CM

## 2020-11-19 NOTE — Therapy (Signed)
[facility] [address] [place], [place], [address] Phone: [phone]   Fax:  [phone]  Physical Therapy Treatment/Re-Certification  Patient Details  Name: [name] [name] MRN: [id number] Date of Birth: 06/25/1975 Referring Provider (PT): Marcus Duda, MD   Encounter Date: 11/19/2020   PT End of Session - 11/19/20 1259     Visit Number 11    Number of Visits 19   per updated POC   Date for PT Re-Evaluation 12/17/20   4 week POC   Authorization Type [employer] and Medicaid (16 visits approved from 4/6-10/25/20); Awaiting New Authorization    Authorization - Visit Number 11    Authorization - Number of Visits 19    Progress Note Due on Visit 10    PT Start Time 1101    PT Stop Time 1144    PT Time Calculation (min) 43 min    Equipment Utilized During Treatment Other (comment)   bar bells, water walker, flotation belt, ankle cuff   Activity Tolerance Patient tolerated treatment well    Behavior During Therapy WFL for tasks assessed/performed             Past Medical History:  Diagnosis Date   Abdominal muscle pain 09/08/2016   Abnormal Pap smear of cervix 2009   Abscess    history of multiple abscesses   Acute bilateral low back pain 02/13/2017   Acute blood loss anemia    Acute on chronic renal failure (HCC) 07/12/2012   Acute renal failure (HCC) 07/12/2012   Adjustment disorder with depressed mood 03/26/2017   AKI (acute kidney injury) (HCC)    Anemia of chronic disease 2002   Anxiety    Panic attacks   Bilateral lower extremity edema 05/13/2016   Bipolar disorder (HCC)    Cataract    B/L cataract   Cellulitis 05/21/2014   right eye   Chronic bronchitis (HCC)    "get it q yr" (05/13/2013)   Chronic pain    Dehiscence of amputation stump (HCC)    Depression    Edema of lower extremity    Endocarditis 2002   subacute bacterial endocarditis.    Family history of anesthesia complication    "my  mom has a hard time coming out from under"   Fibromyalgia    GERD (gastroesophageal reflux disease)    occ   Heart murmur    Herpes simplex type 1 infection 01/16/2018   History of blood transfusion    "just low blood count" (05/13/2013)   Hyperlipidemia    Hypertension    Hypothyroidism    Hypothyroidism, adult 03/21/2014   Hypoxia    Leukocytosis    Necrosis (HCC)    and ulceration   Necrotizing fasciitis s/p OR debridements 07/06/2012   Obesity    OSA on CPAP    does not wear all the time   Peripheral neuropathy    Pneumonia    Type II diabetes mellitus (HCC)    Type  II   Wound dehiscence 09/04/2019    Past Surgical History:  Procedure Laterality Date   ABDOMINAL AORTOGRAM W/LOWER EXTREMITY Left 03/19/2019   Procedure: ABDOMINAL AORTOGRAM W/LOWER EXTREMITY;  Surgeon: Brabham, Vance W, MD;  [facility]: [facility];  Service: Cardiovascular;  Laterality: Left;   ABOVE KNEE LEG AMPUTATION Left 10/11/2019   wound dehisence    AIR/FLUID EXCHANGE Right 12/31/2019   Procedure: AIR/FLUID EXCHANGE;  Surgeon: Patel, Narendra, MD;  Location: [facility];  Service: Ophthalmology;    Laterality: Right;   AMPUTATION Left 04/07/2017   Procedure: LEFT BELOW KNEE AMPUTATION;  Surgeon: Duda, Marcus V, MD;  Location: [facility];  Service: Orthopedics;  Laterality: Left;   AMPUTATION Left 10/11/2019   Procedure: LEFT ABOVE KNEE AMPUTATION;  Surgeon: Duda, Marcus V, MD;  Location: [facility];  Service: Orthopedics;  Laterality: Left;   CATARACT EXTRACTION W/PHACO Right 02/11/2020   Procedure: CATARACT EXTRACTION PHACO AND INTRAOCULAR LENS PLACEMENT (IOC);  Surgeon: Patel, Narendra, MD;  [facility]: [facility];  Service: Ophthalmology;  Laterality: Right;   CORONARY ARTERY BYPASS GRAFT     EYE SURGERY     lazer   GAS INSERTION Right 12/31/2019   Procedure: INSERTION OF GAS;  Surgeon: Patel, Narendra, MD;  [facility]: [facility];  Service: Ophthalmology;  Laterality: Right;   INCISION AND DRAINAGE ABSCESS     multiple  I&Ds   INCISION AND DRAINAGE ABSCESS Left 07/09/2012   Procedure: DRESSING CHANGE, THIGH WOUND;  Surgeon: Douglas A Blackman, MD;  [facility]: [facility];  Service: General;  Laterality: Left;   INCISION AND DRAINAGE OF WOUND Left 07/07/2012   Procedure: IRRIGATION AND DEBRIDEMENT WOUND;  Surgeon: Douglas A Blackman, MD;  [facility];  Service: General;  Laterality: Left;   INCISION AND DRAINAGE PERIRECTAL ABSCESS Left 07/14/2012   Procedure: DEBRIDEMENT OF SKIN & SOFT TISSUE; DRESSING CHANGE UNDER ANESTHESIA;  Surgeon: Eric M Wilson, MD,FACS;  [facility]: [facility];  Service: General;  Laterality: Left;   INCISION AND DRAINAGE PERIRECTAL ABSCESS Left 07/16/2012   Procedure: I&D Left Thigh;  Surgeon: James O Wyatt, MD;  [facility];  Service: General;  Laterality: Left;   INCISION AND DRAINAGE PERIRECTAL ABSCESS N/A 01/05/2015   Procedure: IRRIGATION AND DEBRIDEMENT PERIRECTAL ABSCESS;  Surgeon: Matthew Tsuei, MD;  [facility];  Service: General;  Laterality: N/A;   IRRIGATION AND DEBRIDEMENT ABSCESS Left 07/06/2012   Procedure: IRRIGATION AND DEBRIDEMENT ABSCESS BUTTOCKS AND THIGH;  Surgeon: David H Newman, MD;  [facility];  Service: General;  Laterality: Left;   IRRIGATION AND DEBRIDEMENT ABSCESS Left 08/10/2012   Procedure: IRRIGATION AND DEBRIDEMENT ABSCESS;  Surgeon: Brian Layton, DO;  [facility];  Service: General;  Laterality: Left;   PARS PLANA VITRECTOMY Right 12/31/2019   Procedure: PARS PLANA VITRECTOMY WITH 25 GAUGE;  Surgeon: Patel, Narendra, MD;  [facility];  Service: Ophthalmology;  Laterality: Right;   PHOTOCOAGULATION WITH LASER Right 12/31/2019   Procedure: PHOTOCOAGULATION WITH LASER;  Surgeon: Patel, Narendra, MD;  [facility];  Service: Ophthalmology;  Laterality: Right;   REPAIR OF COMPLEX TRACTION RETINAL DETACHMENT Right 12/31/2019   Procedure: REPAIR OF COMPLEX TRACTION RETINAL;  Surgeon: Patel, Narendra, MD;  [facility];  Service: Ophthalmology;  Laterality:  Right;   STUMP REVISION Left 06/09/2017   Procedure: REVISION LEFT BELOW KNEE AMPUTATION;  Surgeon: Duda, Marcus V, MD;  [facility];  Service: Orthopedics;  Laterality: Left;   STUMP REVISION Left 09/04/2019   Procedure: LEFT BELOW KNEE AMPUTATION REVISION;  Surgeon: Duda, Marcus V, MD;  [facility];  Service: Orthopedics;  Laterality: Left;   STUMP REVISION Left 09/20/2019   Procedure: LEFT BELOW KNEE AMPUTATION REVISION;  Surgeon: Duda, Marcus V, MD;  [facility];  Service: Orthopedics;  Laterality: Left;   TONSILLECTOMY  1994    There were no vitals filed for this visit.   Subjective Assessment - 11/19/20 1106     Subjective Patient reports no new changes. Reports that the cortisone injection has felt like   it has not even lasted a month. Reports her fluid has been fluctuating.    Limitations Walking;House hold activities;Standing    How long can you stand comfortably? 5 mins    How long can you walk comfortably? 5 mins    Patient Stated Goals "I want to be able to walk on this leg and move around"    Currently in Pain? Yes    Pain Score 7     Pain Location Knee    Pain Orientation Right    Pain Descriptors / Indicators Stabbing;Shooting    Pain Type Chronic pain    Pain Onset More than a month ago    Aggravating Factors  weight bearing; standing    Pain Relieving Factors cortizone                [facility] PT Assessment - 11/19/20 0001       Assessment   Medical Diagnosis L AKA    Referring Provider (PT) Marcus Duda, MD             Prosthetics Assessment - 11/19/20 0001       Prosthetics   Prosthetic Care Independent with Skin check;Proper wear schedule/adjustment    Prosthetic Care Comments  Pt with 10 ply socks on.    Current prosthetic wear tolerance (days/week)  daily     Current prosthetic wear tolerance (#hours/day)  5 hours, 2x a day    Residual limb condition  Pt reports the dryness and itchiness on the residual limb, no open wounds reported.  Reports she is following up with Dr. Duda regarding this issues.               [facility] Adult PT Treatment/Exercise - 11/19/20 0001       Transfers   Transfers Sit to Stand;Stand to Sit    Sit to Stand 6: Modified independent (Device/Increase time)    Stand to Sit 6: Modified independent (Device/Increase time)      Ambulation/Gait   Ambulation/Gait Yes    Ambulation/Gait Assistance 5: Supervision;4: Min guard    Ambulation/Gait Assistance Details completed ambuation around therapy gym with RW, Completed gait trianing with SBQC x 57 ft with CGA - Min A for balance.    Ambulation Distance (Feet) 57 Feet   use of RW throughout sesion, use of SBQC   Assistive device Rolling walker;Prosthesis;Small based quad cane    Gait Pattern Step-through pattern;Decreased stride length;Decreased stance time - left;Decreased step length - right;Decreased trunk rotation;Trunk flexed;Wide base of support    Ambulation Surface Level;Indoor    Gait velocity 22.13 secs = 1.48 ft/sec with RW    Stairs Yes    Stairs Assistance 4: Min guard    Stairs Assistance Details (indicate cue type and reason) completed x 4 stairs with Bilat rails and step to pattern, CGA required. Patient reporting unsure if she is descending currectly, PT educating on proper pattern to descend.    Stair Management Technique Two rails;Step to pattern;Forwards    Number of Stairs 4    Height of Stairs 6      Exercises   Exercises Other Exercises    Other Exercises  Completed verbal review of HEP, unable to complete in session due to time constraints. Will review and update at next session.                PT Education - 11/19/20 1145     Education Details progress toward LTGs; verbal review of HEP. Increasing wear time by   1 hour next week and monitor skin    Person(s) Educated Patient    Methods Explanation    Comprehension Verbalized understanding              PT Short Term Goals - 10/08/20 1331       PT SHORT  TERM GOAL #1   Title Pt will be independent with ongoing standing HEP with prosthesis.   (Target Date: 09/25/20)    Baseline met per pt reporting    Time 4    Period Weeks    Status Achieved    Target Date 09/25/20      PT SHORT TERM GOAL #2   Title Pt will be able to tolerate wearing prosthesis 5 hrs, 2x/day with no skin issues and with good understanding of swelling management.    Baseline wearing 4 hrs, 2x/day consistently, educated to increase to 5hrs, 2x/day    Time 4    Period Weeks    Status Partially Met      PT SHORT TERM GOAL #3   Title Pt will  perform dynamic tasks with intermittent UE support x 10 mins at S level and reach to floor level to retrieve item.    Baseline Stood/ambulated x 11 mins total performing multiple standing balance tasks and reaching to floor with single UE support to retrieve item.    Time 4    Period Weeks    Status Achieved      PT SHORT TERM GOAL #4   Title Pt will ambulate x 300' with RW at mod I level without rest break in order to indicate improved activity tolerance.    Baseline met at mostly mod I level, min cues for upright posture    Time 4    Period Weeks    Status Partially Met      PT SHORT TERM GOAL #5   Title Pt will improve gait speed to >/=1.44 ft/sec w/ RW and prosthesis to indicate dec fall risk.    Baseline 1.60 ft/sec with RW and prosthesis    Time 4    Period Weeks    Status Achieved               PT Long Term Goals - 11/19/20 1113       PT LONG TERM GOAL #1   Title Pt will be independent with final HEP in order to indicate improved functional mobility and balance with use of prosthesis.  (Target Date: 10/25/20)    Baseline reports completing 2-3/week, no consistent; will benefit from progressive HEP    Time 8    Period Weeks    Status Not Met      PT LONG TERM GOAL #2   Title Pt will be able to tolerate wearing for 90% of awake hours without skin breakdown and with good understanding of swelling mangement.     Baseline 5 hours, 2 x day. still mild swelling    Time 8    Period Weeks    Status Partially Met      PT LONG TERM GOAL #3   Title Pt will tolerate standing x 15 mins while performing tasks intermittently without UE support and pick up item from floor with single UE support at mod I level.    Baseline able to stand 5 mins at a time; reports reports able to stand 10 minutes at a time    Time 8    Period Weeks    Status Not Met        PT LONG TERM GOAL #4   Title Pt will improve gait speed to >/=2.00 ft/sec with LRAD and prosthesis to indicate dec fall risk.    Baseline .84 ft/sec with wide RW; 1.48 ft/sec with RW    Time 8    Period Weeks    Status Not Met      PT LONG TERM GOAL #5   Title Pt will ambulate x 25' w/ SBQC (or LRAD) and prosthesis at min A level in order to indicate improved independence with gait.    Baseline 10' at min A level; 57 ft with SBQC CGA - Min A    Time 8    Period Weeks    Status Achieved      PT LONG TERM GOAL #6   Title Pt will negotiate up/down 3 steps with single rail and crutch (or LRAD) at S level in order to enter/exit home safely.    Baseline 4 stairs, CGA, bilat rails    Time 8    Period Weeks    Status Not Met            Updated Short Term Goals:   PT Short Term Goals - 11/19/20 1314       PT SHORT TERM GOAL #1   Title = LTGs             Updated Long Term Goals:    PT Long Term Goals - 11/19/20 1313       PT LONG TERM GOAL #1   Title Pt will be independent with final HEP in order to indicate improved functional mobility and balance with use of prosthesis.  (Target Date: 12/17/20)    Baseline reports completing 2-3/week, no consistent; will benefit from progressive HEP    Time 4    Period Weeks    Status On-going    Target Date 12/17/20      PT LONG TERM GOAL #2   Title Pt will be able to tolerate wearing for 90% of awake hours without skin breakdown and with good understanding of swelling mangement.    Baseline 5  hours, 2 x day. still mild swelling    Time 4    Period Weeks    Status On-going      PT LONG TERM GOAL #3   Title Pt will tolerate standing x 15 mins while performing tasks intermittently without UE support and pick up item from floor with single UE support at mod I level.    Baseline able to stand 5 mins at a time; reports reports able to stand 10 minutes at a time    Time 4    Period Weeks    Status On-going      PT LONG TERM GOAL #4   Title Pt will improve gait speed to >/=2.00 ft/sec with LRAD and prosthesis to indicate dec fall risk.    Baseline .84 ft/sec with wide RW; 1.48 ft/sec with RW    Time 4    Period Weeks    Status On-going      PT LONG TERM GOAL #5   Title Pt will ambulate x 75'' w/ SBQC (or LRAD) and prosthesis at CGA level in order to indicate improved independence with gait.    Baseline 10' at min A level; 57 ft with SBQC CGA - Min A    Time 4    Period Weeks    Status Revised      PT LONG TERM GOAL #6   Title  Pt will negotiate up/down 3 steps with single rail and crutch (or LRAD) at S level in order to enter/exit home safely.    Baseline 4 stairs, CGA, bilat rails    Time 4    Period Weeks    Status On-going               11/19/20 1308  Plan  Clinical Impression Statement Today's skilled PT session focused on assesment patient's progress toward LTG. Patient able to meet LTG #5 demonstating ability to ambulate x 57 ft with SBQC with CGA/Min A today. Patient demonstrating progress toward all other LTG, but unable to meet at goal level. With RW patient is currently ambulating at 1.48 ft/sec. Patient currently still wearing prostethic for approx 5 hours, 2x/day. PT educating on gradual increase in wear time with freq skin assesment. Patient will continue to benefit from skilled PT services to progress toward all unmet goals.  Personal Factors and Comorbidities Comorbidity 3+;Past/Current Experience;Time since onset of injury/illness/exacerbation   Comorbidities see above  Examination-Activity Limitations Bend;Carry;Dressing;Stand;Stairs;Squat;Locomotion Level;Transfers  Examination-Participation Restrictions Community Activity;Meal Prep;Shop;Laundry  Pt will benefit from skilled therapeutic intervention in order to improve on the following deficits Abnormal gait;Decreased activity tolerance;Decreased balance;Decreased endurance;Decreased knowledge of precautions;Decreased knowledge of use of DME;Decreased mobility;Decreased range of motion;Decreased strength;Difficulty walking;Increased edema;Impaired perceived functional ability;Impaired flexibility;Impaired sensation;Postural dysfunction;Improper body mechanics;Prosthetic Dependency;Pain  Stability/Clinical Decision Making Evolving/Moderate complexity  Rehab Potential Good  PT Frequency 2x / week  PT Duration 4 weeks  PT Treatment/Interventions ADLs/Self Care Home Management;DME Instruction;Gait training;Stair training;Functional mobility training;Therapeutic activities;Therapeutic exercise;Balance training;Neuromuscular re-education;Patient/family education;Prosthetic Training;Compression bandaging;Passive range of motion;Energy conservation;Iontophoresis 4mg/ml Dexamethasone;Cryotherapy;Aquatic Therapy  PT Next Visit Plan Update HEP as needed based on what she is doing at home already. Work on her activating knee/feeling comfortable being on prosthesis, balance, endurance. Update on Aquatic Therapy. Patient would like to do 1 land visit and 1 aquatic visit per week.  Consulted and Agree with Plan of Care Patient       Patient will benefit from skilled therapeutic intervention in order to improve the following deficits and impairments:     Visit Diagnosis: Unsteadiness on feet  Other abnormalities of gait and mobility  Abnormal posture  Repeated falls     Problem List Patient Active Problem List   Diagnosis Date Noted   Cough 05/26/2020   Chronic pain syndrome 01/16/2020    Hx of AKA (above knee amputation), left (HCC)    Symptomatic anemia 10/21/2019   Anemia 10/21/2019   Acute respiratory failure with hypoxia (HCC) 10/21/2019   Blurry vision, right eye 10/08/2019   Tobacco use 03/01/2019   Phantom limb pain (HCC) 11/08/2018   Current moderate episode of major depressive disorder (HCC)    Hyperkalemia    Anemia of chronic disease    Severe protein-calorie malnutrition (HCC)    Fall    Status post below knee amputation, left (HCC) 04/11/2017   Bipolar affective disorder (HCC)    Heart failure with preserved ejection fraction (HCC) 02/09/2017   Routine adult health maintenance 12/08/2016   Chronic pain of right knee 09/08/2016   Chronic kidney disease (CKD), stage III (moderate) (HCC) 07/15/2016   OSA (obstructive sleep apnea) 05/13/2016   Vaginal irritation 03/14/2016   Morbid obesity due to excess calories (HCC) 11/27/2015   Subclinical hypothyroidism 04/25/2013   Controlled type 2 diabetes mellitus (HCC) 07/12/2012   Carpal tunnel syndrome, bilateral 01/25/2012   Diabetic polyneuropathy associated with type 2 diabetes mellitus (HCC) 07/04/2011   Anxiety and depression 06/02/2011     GERD 12/06/2007   Hyperlipidemia 08/29/2007   Hypertension associated with diabetes ([place]) 08/29/2007    Jones Bales, PT, DPT 11/19/2020, 1:06 PM  [facility] [address] [place], [place], [address] Phone: [phone]   Fax:  [phone]  Name: ARAIYAH CUMPTON MRN: [id number] Date of Birth: Jan 13, 1976

## 2020-11-24 ENCOUNTER — Encounter: Payer: Self-pay | Admitting: *Deleted

## 2020-11-25 ENCOUNTER — Ambulatory Visit (INDEPENDENT_AMBULATORY_CARE_PROVIDER_SITE_OTHER): Payer: Medicare HMO | Admitting: Family

## 2020-11-25 ENCOUNTER — Encounter: Payer: Self-pay | Admitting: Family

## 2020-11-25 ENCOUNTER — Other Ambulatory Visit: Payer: Self-pay

## 2020-11-25 ENCOUNTER — Ambulatory Visit: Payer: Medicare HMO | Attending: Orthopedic Surgery | Admitting: Physical Therapy

## 2020-11-25 ENCOUNTER — Encounter: Payer: Self-pay | Admitting: Physical Therapy

## 2020-11-25 DIAGNOSIS — G8929 Other chronic pain: Secondary | ICD-10-CM | POA: Insufficient documentation

## 2020-11-25 DIAGNOSIS — R2681 Unsteadiness on feet: Secondary | ICD-10-CM | POA: Insufficient documentation

## 2020-11-25 DIAGNOSIS — R2689 Other abnormalities of gait and mobility: Secondary | ICD-10-CM | POA: Insufficient documentation

## 2020-11-25 DIAGNOSIS — Z89612 Acquired absence of left leg above knee: Secondary | ICD-10-CM | POA: Diagnosis not present

## 2020-11-25 DIAGNOSIS — M25652 Stiffness of left hip, not elsewhere classified: Secondary | ICD-10-CM | POA: Insufficient documentation

## 2020-11-25 DIAGNOSIS — M25561 Pain in right knee: Secondary | ICD-10-CM | POA: Insufficient documentation

## 2020-11-25 DIAGNOSIS — L03116 Cellulitis of left lower limb: Secondary | ICD-10-CM | POA: Diagnosis not present

## 2020-11-25 DIAGNOSIS — R293 Abnormal posture: Secondary | ICD-10-CM | POA: Insufficient documentation

## 2020-11-25 DIAGNOSIS — R296 Repeated falls: Secondary | ICD-10-CM | POA: Insufficient documentation

## 2020-11-25 DIAGNOSIS — M6281 Muscle weakness (generalized): Secondary | ICD-10-CM | POA: Insufficient documentation

## 2020-11-25 MED ORDER — AMOXICILLIN-POT CLAVULANATE 875-125 MG PO TABS: 1.0000 | ORAL_TABLET | Freq: Two times a day (BID) | ORAL | 0 refills | Status: DC

## 2020-11-25 NOTE — Progress Notes (Signed)
Office Visit Note   Patient: [name] [name]           Date of Birth: 07/25/1975           MRN: [id number] Visit Date: 11/25/2020              Requested by: Riesa Pope, MD [place],  [place] [address] PCP: Riesa Pope, MD  No chief complaint on file.     HPI: The patient is a 45 year old woman who presents today complaining of pain burning warmth to her left side.  She is status post left above-knee amputation.  She has been continuing with her prosthesis despite exquisite pain with any pressure to her residual limb.  She is concerned about an infection.  Assessment & Plan: Visit Diagnoses: No diagnosis found.  Plan: We will place her on oral antibiotics discussed the importance of close monitoring.  Should she develop any systemic symptoms or fail to improve she will call the on-call or head to the emergency department.  We will re evaluate on Friday Follow-Up Instructions: No follow-ups on file.   Ortho Exam  Patient is alert, oriented, no adenopathy, well-dressed, normal affect, normal respiratory effort.  examination of the left residual limb the incision is well-healed she does have some dry flaking skin to the distal stump she reports this is typical for her from an bearing.  There are 2 very superficial ulcer to the areas these are filled in with macerated tissue there is no depth there is dimpling of her thigh especially the posterior thigh with swelling and tenderness this is diffuse there is no palpable abscess or fluctuance there is mild warmth there is no erythema   Imaging: No results found. No images are attached to the encounter.  Labs: Lab Results  Component Value Date   HGBA1C 6.0 (A) 09/10/2020   HGBA1C 8.5 (A) 02/13/2020   HGBA1C 8.5 (H) 09/04/2019   ESRSEDRATE 130 (H) 03/24/2017   ESRSEDRATE 53 (H) 09/14/2015   ESRSEDRATE 132 (H) 07/06/2012   CRP 13.6 (H) 02/14/2019   CRP 38.6 (H) 04/03/2018   CRP 5.4 (H) 09/14/2015    LABURIC 8.3 (H) 04/15/2019   REPTSTATUS 10/27/2019 FINAL 10/22/2019   GRAMSTAIN  09/04/2019    FEW WBC PRESENT, PREDOMINANTLY PMN MODERATE GRAM POSITIVE COCCI    CULT  10/22/2019    NO GROWTH 5 DAYS Performed at [facility], [place] [address], [place], [place] [address]    LABORGA STAPHYLOCOCCUS AUREUS 09/04/2019     Lab Results  Component Value Date   ALBUMIN 2.4 (L) 10/21/2019   ALBUMIN 2.3 (L) 09/18/2019   ALBUMIN 1.8 (L) 09/01/2019    Lab Results  Component Value Date   MG 2.3 04/07/2018   MG 2.1 04/05/2018   MG 2.4 03/25/2017   No results found for: VD25OH  No results found for: PREALBUMIN CBC EXTENDED Latest Ref Rng & Units 10/02/2020 09/10/2020 02/11/2020  WBC 4.0 - 10.5 K/uL 5.4 5.2 5.2  RBC 3.87 - 5.11 MIL/uL 3.54(L) 3.93 4.28  HGB 12.0 - 15.0 g/dL 9.2(L) 10.3(L) 10.9(L)  HCT 36.0 - 46.0 % 30.7(L) 32.2(L) 34.8(L)  PLT 150 - 400 K/uL 340 285 284  NEUTROABS 1.7 - 7.7 K/uL - - -  LYMPHSABS 0.7 - 4.0 K/uL - - -     There is no height or weight on file to calculate BMI.  Orders:  No orders of the defined types were placed in this encounter.  No orders  of the defined types were placed in this encounter.    Procedures: No procedures performed  Clinical Data: No additional findings.  ROS:  All other systems negative, except as noted in the HPI. Review of Systems  Constitutional:  Negative for chills and fever.  Skin:  Positive for color change and wound. Negative for rash.   Objective: Vital Signs: There were no vitals taken for this visit.  Specialty Comments:  No specialty comments available.  PMFS History: Patient Active Problem List   Diagnosis Date Noted   Cough 05/26/2020   Chronic pain syndrome 01/16/2020   Hx of AKA (above knee amputation), left ([place])    Symptomatic anemia 10/21/2019   Anemia 10/21/2019   Acute respiratory failure with hypoxia ([place]) 10/21/2019   Blurry vision, right eye 10/08/2019   Tobacco use 03/01/2019    Phantom limb pain ([place]) 11/08/2018   Current moderate episode of major depressive disorder ([place])    Hyperkalemia    Anemia of chronic disease    Severe protein-calorie malnutrition ([place])    Fall    Status post below knee amputation, left ([place]) 04/11/2017   Bipolar affective disorder ([place])    Heart failure with preserved ejection fraction ([place]) 02/09/2017   Routine adult health maintenance 12/08/2016   Chronic pain of right knee 09/08/2016   Chronic kidney disease (CKD), stage III (moderate) ([place]) 07/15/2016   OSA (obstructive sleep apnea) 05/13/2016   Vaginal irritation 03/14/2016   Morbid obesity due to excess calories ([place]) 11/27/2015   Subclinical hypothyroidism 04/25/2013   Controlled type 2 diabetes mellitus ([place]) 07/12/2012   Carpal tunnel syndrome, bilateral 01/25/2012   Diabetic polyneuropathy associated with type 2 diabetes mellitus ([place]) 07/04/2011   Anxiety and depression 06/02/2011   GERD 12/06/2007   Hyperlipidemia 08/29/2007   Hypertension associated with diabetes ([place]) 08/29/2007   Past Medical History:  Diagnosis Date   Abdominal muscle pain 09/08/2016   Abnormal Pap smear of cervix 2009   Abscess    history of multiple abscesses   Acute bilateral low back pain 02/13/2017   Acute blood loss anemia    Acute on chronic renal failure ([place]) 07/12/2012   Acute renal failure ([place]) 07/12/2012   Adjustment disorder with depressed mood 03/26/2017   AKI (acute kidney injury) ([place])    Anemia of chronic disease 2002   Anxiety    Panic attacks   Bilateral lower extremity edema 05/13/2016   Bipolar disorder ([place])    Cataract    B/L cataract   Cellulitis 05/21/2014   right eye   Chronic bronchitis ([place])    "get it q yr" (05/13/2013)   Chronic pain    Dehiscence of amputation stump ([place])    Depression    Edema of lower extremity    Endocarditis 2002   subacute bacterial endocarditis.    Family history of anesthesia complication    "my mom has a hard time coming out  from under"   Fibromyalgia    GERD (gastroesophageal reflux disease)    occ   Heart murmur    Herpes simplex type 1 infection 01/16/2018   History of blood transfusion    "just low blood count" (05/13/2013)   Hyperlipidemia    Hypertension    Hypothyroidism    Hypothyroidism, adult 03/21/2014   Hypoxia    Leukocytosis    Necrosis ([place])    and ulceration   Necrotizing fasciitis s/p OR debridements 07/06/2012   Obesity    OSA on CPAP    does  not wear all the time   Peripheral neuropathy    Pneumonia    Type II diabetes mellitus ([place])    Type  II   Wound dehiscence 09/04/2019    Family History  Problem Relation Age of Onset   Heart failure Mother    Diabetes Mother    Kidney disease Mother    Kidney disease Father    Diabetes Father    Diabetes Paternal Grandmother    Heart failure Paternal Grandmother    Other Other     Past Surgical History:  Procedure Laterality Date   ABDOMINAL AORTOGRAM W/LOWER EXTREMITY Left 03/19/2019   Procedure: ABDOMINAL AORTOGRAM W/LOWER EXTREMITY;  Surgeon: Serafina Mitchell, MD;  Location: [facility];  Service: Cardiovascular;  Laterality: Left;   ABOVE KNEE LEG AMPUTATION Left 10/11/2019   wound dehisence    AIR/FLUID EXCHANGE Right 12/31/2019   Procedure: AIR/FLUID EXCHANGE;  Surgeon: Jalene Mullet, MD;  Location: [facility];  Service: Ophthalmology;  Laterality: Right;   AMPUTATION Left 04/07/2017   Procedure: LEFT BELOW KNEE AMPUTATION;  Surgeon: Newt Minion, MD;  Location: [facility];  Service: Orthopedics;  Laterality: Left;   AMPUTATION Left 10/11/2019   Procedure: LEFT ABOVE KNEE AMPUTATION;  Surgeon: Newt Minion, MD;  Location: [facility];  Service: Orthopedics;  Laterality: Left;   CATARACT EXTRACTION W/PHACO Right 02/11/2020   Procedure: CATARACT EXTRACTION PHACO AND INTRAOCULAR LENS PLACEMENT (IOC);  Surgeon: Jalene Mullet, MD;  Location: [facility];  Service: Ophthalmology;  Laterality: Right;   CORONARY ARTERY BYPASS GRAFT     EYE  SURGERY     lazer   GAS INSERTION Right 12/31/2019   Procedure: INSERTION OF GAS;  Surgeon: Jalene Mullet, MD;  Location: [facility];  Service: Ophthalmology;  Laterality: Right;   INCISION AND DRAINAGE ABSCESS     multiple I&Ds   INCISION AND DRAINAGE ABSCESS Left 07/09/2012   Procedure: DRESSING CHANGE, THIGH WOUND;  Surgeon: Harl Bowie, MD;  Location: [facility];  Service: General;  Laterality: Left;   INCISION AND DRAINAGE OF WOUND Left 07/07/2012   Procedure: IRRIGATION AND DEBRIDEMENT WOUND;  Surgeon: Harl Bowie, MD;  Location: [facility];  Service: General;  Laterality: Left;   INCISION AND DRAINAGE PERIRECTAL ABSCESS Left 07/14/2012   Procedure: DEBRIDEMENT OF SKIN & SOFT TISSUE; DRESSING CHANGE UNDER ANESTHESIA;  Surgeon: Gayland Curry, MD,FACS;  Location: [facility];  Service: General;  Laterality: Left;   INCISION AND DRAINAGE PERIRECTAL ABSCESS Left 07/16/2012   Procedure: I&D Left Thigh;  Surgeon: Gwenyth Ober, MD;  Location: [facility];  Service: General;  Laterality: Left;   INCISION AND DRAINAGE PERIRECTAL ABSCESS N/A 01/05/2015   Procedure: IRRIGATION AND DEBRIDEMENT PERIRECTAL ABSCESS;  Surgeon: Donnie Mesa, MD;  Location: [facility];  Service: General;  Laterality: N/A;   IRRIGATION AND DEBRIDEMENT ABSCESS Left 07/06/2012   Procedure: IRRIGATION AND DEBRIDEMENT ABSCESS BUTTOCKS AND THIGH;  Surgeon: Shann Medal, MD;  Location: [facility];  Service: General;  Laterality: Left;   IRRIGATION AND DEBRIDEMENT ABSCESS Left 08/10/2012   Procedure: IRRIGATION AND DEBRIDEMENT ABSCESS;  Surgeon: Madilyn Hook, DO;  Location: [facility];  Service: General;  Laterality: Left;   PARS PLANA VITRECTOMY Right 12/31/2019   Procedure: PARS PLANA VITRECTOMY WITH 25 GAUGE;  Surgeon: Jalene Mullet, MD;  Location: [facility];  Service: Ophthalmology;  Laterality: Right;   PHOTOCOAGULATION WITH LASER Right 12/31/2019   Procedure: PHOTOCOAGULATION WITH LASER;  Surgeon: Jalene Mullet, MD;  Location: [facility];  Service:  Ophthalmology;  Laterality: Right;   REPAIR OF COMPLEX TRACTION RETINAL DETACHMENT Right 12/31/2019   Procedure: REPAIR OF COMPLEX TRACTION RETINAL;  Surgeon: Jalene Mullet, MD;  Location: [facility];  Service: Ophthalmology;  Laterality: Right;   STUMP REVISION Left 06/09/2017   Procedure: REVISION LEFT BELOW KNEE AMPUTATION;  Surgeon: Newt Minion, MD;  Location: [facility];  Service: Orthopedics;  Laterality: Left;   STUMP REVISION Left 09/04/2019   Procedure: LEFT BELOW KNEE AMPUTATION REVISION;  Surgeon: Newt Minion, MD;  Location: [facility];  Service: Orthopedics;  Laterality: Left;   STUMP REVISION Left 09/20/2019   Procedure: LEFT BELOW KNEE AMPUTATION REVISION;  Surgeon: Newt Minion, MD;  Location: [facility];  Service: Orthopedics;  Laterality: Left;   TONSILLECTOMY  1994   Social History   Occupational History   Occupation: disability  Tobacco Use   Smoking status: Every Day    Packs/day: 0.50    Years: 16.00    Pack years: 8.00    Types: Cigarettes    Last attempt to quit: 07/22/2019    Years since quitting: 1.3   Smokeless tobacco: Never  Vaping Use   Vaping Use: Former   Devices: CBD oil  Substance and Sexual Activity   Alcohol use: Yes    Alcohol/week: 0.0 standard drinks    Comment: social   Drug use: No   Sexual activity: Yes

## 2020-11-25 NOTE — Therapy (Signed)
[facility] [address] [place], [place], [address] Phone: [phone]   Fax:  [phone]  Physical Therapy Treatment  Patient Details  Name: [name] [name] MRN: [id number] Date of Birth: [birth year] Referring Provider (PT): Meridee Score, MD   Encounter Date: 11/25/2020   PT End of Session - 11/25/20 1120     Visit Number 11   visit cancelled due to medical issues, number did not change   Number of Visits 19   per updated POC   Date for PT Re-Evaluation 12/17/20   4 week POC   Authorization Type [employer] [place] and Medicaid (16 visits approved from 4/6-10/25/20); Awaiting New Authorization    Authorization - Visit Number 11    Authorization - Number of Visits 19    Progress Note Due on Visit 10    PT Start Time 1102    PT Stop Time 1120   arrive no charge due to medical issues   PT Time Calculation (min) 18 min    Equipment Utilized During Treatment --    Activity Tolerance Patient tolerated treatment well    Behavior During Therapy [facility] for tasks assessed/performed             Past Medical History:  Diagnosis Date   Abdominal muscle pain 09/08/2016   Abnormal Pap smear of cervix 2009   Abscess    history of multiple abscesses   Acute bilateral low back pain 02/13/2017   Acute blood loss anemia    Acute on chronic renal failure ([place]) 07/12/2012   Acute renal failure ([place]) 07/12/2012   Adjustment disorder with depressed mood 03/26/2017   AKI (acute kidney injury) ([place])    Anemia of chronic disease 2002   Anxiety    Panic attacks   Bilateral lower extremity edema 05/13/2016   Bipolar disorder ([place])    Cataract    B/L cataract   Cellulitis 05/21/2014   right eye   Chronic bronchitis ([place])    "get it q yr" (05/13/2013)   Chronic pain    Dehiscence of amputation stump (HCC)    Depression    Edema of lower extremity    Endocarditis 2002   subacute bacterial endocarditis.    Family history of anesthesia  complication    "my mom has a hard time coming out from under"   Fibromyalgia    GERD (gastroesophageal reflux disease)    occ   Heart murmur    Herpes simplex type 1 infection 01/16/2018   History of blood transfusion    "just low blood count" (05/13/2013)   Hyperlipidemia    Hypertension    Hypothyroidism    Hypothyroidism, adult 03/21/2014   Hypoxia    Leukocytosis    Necrosis (HCC)    and ulceration   Necrotizing fasciitis s/p OR debridements 07/06/2012   Obesity    OSA on CPAP    does not wear all the time   Peripheral neuropathy    Pneumonia    Type II diabetes mellitus ([place])    Type  II   Wound dehiscence 09/04/2019    Past Surgical History:  Procedure Laterality Date   ABDOMINAL AORTOGRAM W/LOWER EXTREMITY Left 03/19/2019   Procedure: ABDOMINAL AORTOGRAM W/LOWER EXTREMITY;  Surgeon: Serafina Mitchell, MD;  Location: [facility];  Service: Cardiovascular;  Laterality: Left;   ABOVE KNEE LEG AMPUTATION Left 10/11/2019   wound dehisence    AIR/FLUID EXCHANGE Right 12/31/2019   Procedure: AIR/FLUID EXCHANGE;  Surgeon: Posey Pronto,  Dareen Piano, MD;  Location: [facility];  Service: Ophthalmology;  Laterality: Right;   AMPUTATION Left 04/07/2017   Procedure: LEFT BELOW KNEE AMPUTATION;  Surgeon: Newt Minion, MD;  Location: [facility];  Service: Orthopedics;  Laterality: Left;   AMPUTATION Left 10/11/2019   Procedure: LEFT ABOVE KNEE AMPUTATION;  Surgeon: Newt Minion, MD;  Location: [facility];  Service: Orthopedics;  Laterality: Left;   CATARACT EXTRACTION W/PHACO Right 02/11/2020   Procedure: CATARACT EXTRACTION PHACO AND INTRAOCULAR LENS PLACEMENT (IOC);  Surgeon: Jalene Mullet, MD;  Location: [facility];  Service: Ophthalmology;  Laterality: Right;   CORONARY ARTERY BYPASS GRAFT     EYE SURGERY     lazer   GAS INSERTION Right 12/31/2019   Procedure: INSERTION OF GAS;  Surgeon: Jalene Mullet, MD;  Location: [facility];  Service: Ophthalmology;  Laterality: Right;   INCISION AND DRAINAGE  ABSCESS     multiple I&Ds   INCISION AND DRAINAGE ABSCESS Left 07/09/2012   Procedure: DRESSING CHANGE, THIGH WOUND;  Surgeon: Harl Bowie, MD;  Location: [facility];  Service: General;  Laterality: Left;   INCISION AND DRAINAGE OF WOUND Left 07/07/2012   Procedure: IRRIGATION AND DEBRIDEMENT WOUND;  Surgeon: Harl Bowie, MD;  Location: [facility];  Service: General;  Laterality: Left;   INCISION AND DRAINAGE PERIRECTAL ABSCESS Left 07/14/2012   Procedure: DEBRIDEMENT OF SKIN & SOFT TISSUE; DRESSING CHANGE UNDER ANESTHESIA;  Surgeon: Gayland Curry, MD,FACS;  Location: [facility];  Service: General;  Laterality: Left;   INCISION AND DRAINAGE PERIRECTAL ABSCESS Left 07/16/2012   Procedure: I&D Left Thigh;  Surgeon: Gwenyth Ober, MD;  Location: [facility];  Service: General;  Laterality: Left;   INCISION AND DRAINAGE PERIRECTAL ABSCESS N/A 01/05/2015   Procedure: IRRIGATION AND DEBRIDEMENT PERIRECTAL ABSCESS;  Surgeon: Donnie Mesa, MD;  Location: [facility];  Service: General;  Laterality: N/A;   IRRIGATION AND DEBRIDEMENT ABSCESS Left 07/06/2012   Procedure: IRRIGATION AND DEBRIDEMENT ABSCESS BUTTOCKS AND THIGH;  Surgeon: Shann Medal, MD;  Location: [facility];  Service: General;  Laterality: Left;   IRRIGATION AND DEBRIDEMENT ABSCESS Left 08/10/2012   Procedure: IRRIGATION AND DEBRIDEMENT ABSCESS;  Surgeon: Madilyn Hook, DO;  Location: [facility];  Service: General;  Laterality: Left;   PARS PLANA VITRECTOMY Right 12/31/2019   Procedure: PARS PLANA VITRECTOMY WITH 25 GAUGE;  Surgeon: Jalene Mullet, MD;  Location: [facility];  Service: Ophthalmology;  Laterality: Right;   PHOTOCOAGULATION WITH LASER Right 12/31/2019   Procedure: PHOTOCOAGULATION WITH LASER;  Surgeon: Jalene Mullet, MD;  Location: [facility];  Service: Ophthalmology;  Laterality: Right;   REPAIR OF COMPLEX TRACTION RETINAL DETACHMENT Right 12/31/2019   Procedure: REPAIR OF COMPLEX TRACTION RETINAL;  Surgeon: Jalene Mullet, MD;  Location: [facility];  Service:  Ophthalmology;  Laterality: Right;   STUMP REVISION Left 06/09/2017   Procedure: REVISION LEFT BELOW KNEE AMPUTATION;  Surgeon: Newt Minion, MD;  Location: [facility];  Service: Orthopedics;  Laterality: Left;   STUMP REVISION Left 09/04/2019   Procedure: LEFT BELOW KNEE AMPUTATION REVISION;  Surgeon: Newt Minion, MD;  Location: [facility];  Service: Orthopedics;  Laterality: Left;   STUMP REVISION Left 09/20/2019   Procedure: LEFT BELOW KNEE AMPUTATION REVISION;  Surgeon: Newt Minion, MD;  Location: [facility];  Service: Orthopedics;  Laterality: Left;   TONSILLECTOMY  1994    There were no vitals filed for this visit.   Subjective Assessment - 11/25/20 1107     Subjective Having increased pain/swelling  in left limb. Has not been able to wear prosthesis for past 2 days. Did get it on today with one sock. Still very painful. Started on Friday when she overall felt bad with fever and chills. Fever and chills broke on Friday around 6-7 pm  after taking meds, with only one other fever occurance since then.    Limitations Walking;House hold activities;Standing    How long can you stand comfortably? 5 mins    Patient Stated Goals "I want to be able to walk on this leg and move around"    Currently in Pain? Yes    Pain Score 10-Worst pain ever    Pain Location Leg    Pain Orientation Left    Pain Descriptors / Indicators Throbbing;Tender;Numbness   "heat on it"   Pain Type Acute pain    Pain Onset In the past 7 days    Pain Frequency Constant    Aggravating Factors  weight bearing, standing            Pt arrived with prosthesis on using RW. Pt reporting increased limb pain with decreased stance noted and pt not engaging knee with gait. See Clinical Impression Statement for full details.            PT Short Term Goals - 11/19/20 1314       PT SHORT TERM GOAL #1   Title = LTGs               PT Long Term Goals - 11/19/20 1313       PT LONG TERM GOAL #1   Title Pt will be  independent with final HEP in order to indicate improved functional mobility and balance with use of prosthesis.  (Target Date: 12/17/20)    Baseline reports completing 2-3/week, no consistent; will benefit from progressive HEP    Time 4    Period Weeks    Status On-going    Target Date 12/17/20      PT LONG TERM GOAL #2   Title Pt will be able to tolerate wearing for 90% of awake hours without skin breakdown and with good understanding of swelling mangement.    Baseline 5 hours, 2 x day. still mild swelling    Time 4    Period Weeks    Status On-going      PT LONG TERM GOAL #3   Title Pt will tolerate standing x 15 mins while performing tasks intermittently without UE support and pick up item from floor with single UE support at mod I level.    Baseline able to stand 5 mins at a time; reports reports able to stand 10 minutes at a time    Time 4    Period Weeks    Status On-going      PT LONG TERM GOAL #4   Title Pt will improve gait speed to >/=2.00 ft/sec with LRAD and prosthesis to indicate dec fall risk.    Baseline .84 ft/sec with wide RW; 1.48 ft/sec with RW    Time 4    Period Weeks    Status On-going      PT LONG TERM GOAL #5   Title Pt will ambulate x 75'' w/ SBQC (or LRAD) and prosthesis at CGA level in order to indicate improved independence with gait.    Baseline 10' at min A level; 34 ft with [facility] CGA - Min A    Time 4    Period Weeks    Status  Revised      PT LONG TERM GOAL #6   Title Pt will negotiate up/down 3 steps with single rail and crutch (or LRAD) at S level in order to enter/exit home safely.    Baseline 4 stairs, CGA, bilat rails    Time 4    Period Weeks    Status On-going                   Plan - 11/25/20 1121     Clinical Impression Statement Pt arrived for PT session today with increased pain in left residual limb. When prosthesis was removed swelling noted. Pt very sensitive to light touch with warmth noted (of note she did just have  the liner/prosthesis on that could account for this). Due to new onset of issues/pain call placed to Dr. Jess Barters office. Pt to go over and be seen by Dondra Prader, NP to have limb checked out. Pt is scheduled to return to PT on Friday of this week. She is to call if she is not going to make it depending on pain and what is said at the doctors office.    Personal Factors and Comorbidities Comorbidity 3+;Past/Current Experience;Time since onset of injury/illness/exacerbation    Comorbidities see above    Examination-Activity Limitations [place];Carry;Dressing;Stand;Stairs;Squat;Locomotion Level;Transfers    Examination-Participation Restrictions Community Activity;Meal Prep;Shop;Laundry    Stability/Clinical Decision Making Evolving/Moderate complexity    Rehab Potential Good    PT Frequency 2x / week    PT Duration 4 weeks    PT Treatment/Interventions ADLs/Self Care Home Management;DME Instruction;Gait training;Stair training;Functional mobility training;Therapeutic activities;Therapeutic exercise;Balance training;Neuromuscular re-education;Patient/family education;Prosthetic Training;Compression bandaging;Passive range of motion;Energy conservation;Iontophoresis 4mg /ml Dexamethasone;Cryotherapy;Aquatic Therapy    PT Next Visit Plan What did Junie Panning say about the limb pain/swelling? Update HEP as needed based on what she is doing at home already. Work on her activating knee/feeling comfortable being on prosthesis, balance, endurance. Update on Aquatic Therapy. Patient would like to do 1 land visit and 1 aquatic visit per week- [facility] is aiming for next Wed 12/02/20 to get back into the pool also depending on what the MD offices says today.    Consulted and Agree with Plan of Care Patient             Patient will benefit from skilled therapeutic intervention in order to improve the following deficits and impairments:  Abnormal gait, Decreased activity tolerance, Decreased balance, Decreased endurance,  Decreased knowledge of precautions, Decreased knowledge of use of DME, Decreased mobility, Decreased range of motion, Decreased strength, Difficulty walking, Increased edema, Impaired perceived functional ability, Impaired flexibility, Impaired sensation, Postural dysfunction, Improper body mechanics, Prosthetic Dependency, Pain  Visit Diagnosis: Unsteadiness on feet  Other abnormalities of gait and mobility     Problem List Patient Active Problem List   Diagnosis Date Noted   Cough 05/26/2020   Chronic pain syndrome 01/16/2020   Hx of AKA (above knee amputation), left (HCC)    Symptomatic anemia 10/21/2019   Anemia 10/21/2019   Acute respiratory failure with hypoxia ([place]) 10/21/2019   Blurry vision, right eye 10/08/2019   Tobacco use 03/01/2019   Phantom limb pain ([place]) 11/08/2018   Current moderate episode of major depressive disorder ([place])    Hyperkalemia    Anemia of chronic disease    Severe protein-calorie malnutrition ([place])    Fall    Status post below knee amputation, left ([place]) 04/11/2017   Bipolar affective disorder ([place])    Heart failure with preserved ejection fraction ([place]) 02/09/2017  Routine adult health maintenance 12/08/2016   Chronic pain of right knee 09/08/2016   Chronic kidney disease (CKD), stage III (moderate) ([place]) 07/15/2016   OSA (obstructive sleep apnea) 05/13/2016   Vaginal irritation 03/14/2016   Morbid obesity due to excess calories ([place]) 11/27/2015   Subclinical hypothyroidism 04/25/2013   Controlled type 2 diabetes mellitus ([place]) 07/12/2012   Carpal tunnel syndrome, bilateral 01/25/2012   Diabetic polyneuropathy associated with type 2 diabetes mellitus ([place]) 07/04/2011   Anxiety and depression 06/02/2011   GERD 12/06/2007   Hyperlipidemia 08/29/2007   Hypertension associated with diabetes ([place]) 08/29/2007    Willow Ora, PTA, [facility] [address], [place], [place] [address] [phone] 11/25/20,  11:42 AM   Name: BULA CAVALIERI MRN: [id number] Date of Birth: Apr 09, 1976

## 2020-11-26 DIAGNOSIS — Z89512 Acquired absence of left leg below knee: Secondary | ICD-10-CM | POA: Diagnosis not present

## 2020-11-27 ENCOUNTER — Ambulatory Visit: Payer: Medicare HMO | Admitting: Physician Assistant

## 2020-11-27 ENCOUNTER — Other Ambulatory Visit: Payer: Self-pay

## 2020-11-27 ENCOUNTER — Ambulatory Visit: Payer: Medicare HMO | Admitting: Physical Therapy

## 2020-11-27 DIAGNOSIS — R293 Abnormal posture: Secondary | ICD-10-CM

## 2020-11-27 DIAGNOSIS — R2689 Other abnormalities of gait and mobility: Secondary | ICD-10-CM

## 2020-11-27 DIAGNOSIS — G8929 Other chronic pain: Secondary | ICD-10-CM | POA: Diagnosis not present

## 2020-11-27 DIAGNOSIS — R2681 Unsteadiness on feet: Secondary | ICD-10-CM | POA: Diagnosis not present

## 2020-11-27 DIAGNOSIS — M25561 Pain in right knee: Secondary | ICD-10-CM | POA: Diagnosis not present

## 2020-11-27 DIAGNOSIS — M6281 Muscle weakness (generalized): Secondary | ICD-10-CM

## 2020-11-27 DIAGNOSIS — M25652 Stiffness of left hip, not elsewhere classified: Secondary | ICD-10-CM | POA: Diagnosis not present

## 2020-11-27 DIAGNOSIS — R296 Repeated falls: Secondary | ICD-10-CM

## 2020-11-28 NOTE — Therapy (Signed)
[facility] [address] [place], [place], [address] Phone: [phone]   Fax:  [phone]  Physical Therapy Treatment  Patient Details  Name: [name] [name] MRN: [id number] Date of Birth: Aug 15, 1975 Referring Provider (PT): Meridee Score, MD   Encounter Date: 11/27/2020   PT End of Session - 11/28/20 1516     Visit Number 12    Number of Visits 28    Date for PT Re-Evaluation 01/27/21    Authorization Type [employer] [place] and Medicaid (6 visits approved 6/20 - 11/27/20); have requested 16 more visits (2x/week x 8 more weeks)    Progress Note Due on Visit 20    PT Start Time 1149    PT Stop Time 1234    PT Time Calculation (min) 45 min    Equipment Utilized During Treatment Gait belt    Activity Tolerance Patient tolerated treatment well    Behavior During Therapy [facility] for tasks assessed/performed             Past Medical History:  Diagnosis Date   Abdominal muscle pain 09/08/2016   Abnormal Pap smear of cervix 2009   Abscess    history of multiple abscesses   Acute bilateral low back pain 02/13/2017   Acute blood loss anemia    Acute on chronic renal failure ([place]) 07/12/2012   Acute renal failure ([place]) 07/12/2012   Adjustment disorder with depressed mood 03/26/2017   AKI (acute kidney injury) ([place])    Anemia of chronic disease 2002   Anxiety    Panic attacks   Bilateral lower extremity edema 05/13/2016   Bipolar disorder ([place])    Cataract    B/L cataract   Cellulitis 05/21/2014   right eye   Chronic bronchitis ([place])    "get it q yr" (05/13/2013)   Chronic pain    Dehiscence of amputation stump ([place])    Depression    Edema of lower extremity    Endocarditis 2002   subacute bacterial endocarditis.    Family history of anesthesia complication    "my mom has a hard time coming out from under"   Fibromyalgia    GERD (gastroesophageal reflux disease)    occ   Heart murmur    Herpes simplex type 1  infection 01/16/2018   History of blood transfusion    "just low blood count" (05/13/2013)   Hyperlipidemia    Hypertension    Hypothyroidism    Hypothyroidism, adult 03/21/2014   Hypoxia    Leukocytosis    Necrosis (HCC)    and ulceration   Necrotizing fasciitis s/p OR debridements 07/06/2012   Obesity    OSA on CPAP    does not wear all the time   Peripheral neuropathy    Pneumonia    Type II diabetes mellitus ([place])    Type  II   Wound dehiscence 09/04/2019    Past Surgical History:  Procedure Laterality Date   ABDOMINAL AORTOGRAM W/LOWER EXTREMITY Left 03/19/2019   Procedure: ABDOMINAL AORTOGRAM W/LOWER EXTREMITY;  Surgeon: Serafina Mitchell, MD;  Location: [facility];  Service: Cardiovascular;  Laterality: Left;   ABOVE KNEE LEG AMPUTATION Left 10/11/2019   wound dehisence    AIR/FLUID EXCHANGE Right 12/31/2019   Procedure: AIR/FLUID EXCHANGE;  Surgeon: Jalene Mullet, MD;  Location: [facility];  Service: Ophthalmology;  Laterality: Right;   AMPUTATION Left 04/07/2017   Procedure: LEFT BELOW KNEE AMPUTATION;  Surgeon: Newt Minion, MD;  Location: [facility];  Service: Orthopedics;  Laterality: Left;   AMPUTATION Left 10/11/2019   Procedure: LEFT ABOVE KNEE AMPUTATION;  Surgeon: Newt Minion, MD;  Location: [facility];  Service: Orthopedics;  Laterality: Left;   CATARACT EXTRACTION W/PHACO Right 02/11/2020   Procedure: CATARACT EXTRACTION PHACO AND INTRAOCULAR LENS PLACEMENT (IOC);  Surgeon: Jalene Mullet, MD;  Location: [facility];  Service: Ophthalmology;  Laterality: Right;   CORONARY ARTERY BYPASS GRAFT     EYE SURGERY     lazer   GAS INSERTION Right 12/31/2019   Procedure: INSERTION OF GAS;  Surgeon: Jalene Mullet, MD;  Location: [facility];  Service: Ophthalmology;  Laterality: Right;   INCISION AND DRAINAGE ABSCESS     multiple I&Ds   INCISION AND DRAINAGE ABSCESS Left 07/09/2012   Procedure: DRESSING CHANGE, THIGH WOUND;  Surgeon: Harl Bowie, MD;  Location: [facility];   Service: General;  Laterality: Left;   INCISION AND DRAINAGE OF WOUND Left 07/07/2012   Procedure: IRRIGATION AND DEBRIDEMENT WOUND;  Surgeon: Harl Bowie, MD;  Location: [facility];  Service: General;  Laterality: Left;   INCISION AND DRAINAGE PERIRECTAL ABSCESS Left 07/14/2012   Procedure: DEBRIDEMENT OF SKIN & SOFT TISSUE; DRESSING CHANGE UNDER ANESTHESIA;  Surgeon: Gayland Curry, MD,FACS;  Location: [facility];  Service: General;  Laterality: Left;   INCISION AND DRAINAGE PERIRECTAL ABSCESS Left 07/16/2012   Procedure: I&D Left Thigh;  Surgeon: Gwenyth Ober, MD;  Location: [facility];  Service: General;  Laterality: Left;   INCISION AND DRAINAGE PERIRECTAL ABSCESS N/A 01/05/2015   Procedure: IRRIGATION AND DEBRIDEMENT PERIRECTAL ABSCESS;  Surgeon: Donnie Mesa, MD;  Location: [facility];  Service: General;  Laterality: N/A;   IRRIGATION AND DEBRIDEMENT ABSCESS Left 07/06/2012   Procedure: IRRIGATION AND DEBRIDEMENT ABSCESS BUTTOCKS AND THIGH;  Surgeon: Shann Medal, MD;  Location: [facility];  Service: General;  Laterality: Left;   IRRIGATION AND DEBRIDEMENT ABSCESS Left 08/10/2012   Procedure: IRRIGATION AND DEBRIDEMENT ABSCESS;  Surgeon: Madilyn Hook, DO;  Location: [facility];  Service: General;  Laterality: Left;   PARS PLANA VITRECTOMY Right 12/31/2019   Procedure: PARS PLANA VITRECTOMY WITH 25 GAUGE;  Surgeon: Jalene Mullet, MD;  Location: [facility];  Service: Ophthalmology;  Laterality: Right;   PHOTOCOAGULATION WITH LASER Right 12/31/2019   Procedure: PHOTOCOAGULATION WITH LASER;  Surgeon: Jalene Mullet, MD;  Location: [facility];  Service: Ophthalmology;  Laterality: Right;   REPAIR OF COMPLEX TRACTION RETINAL DETACHMENT Right 12/31/2019   Procedure: REPAIR OF COMPLEX TRACTION RETINAL;  Surgeon: Jalene Mullet, MD;  Location: [facility];  Service: Ophthalmology;  Laterality: Right;   STUMP REVISION Left 06/09/2017   Procedure: REVISION LEFT BELOW KNEE AMPUTATION;  Surgeon: Newt Minion, MD;  Location: [facility];  Service:  Orthopedics;  Laterality: Left;   STUMP REVISION Left 09/04/2019   Procedure: LEFT BELOW KNEE AMPUTATION REVISION;  Surgeon: Newt Minion, MD;  Location: [facility];  Service: Orthopedics;  Laterality: Left;   STUMP REVISION Left 09/20/2019   Procedure: LEFT BELOW KNEE AMPUTATION REVISION;  Surgeon: Newt Minion, MD;  Location: [facility];  Service: Orthopedics;  Laterality: Left;   TONSILLECTOMY  1994    There were no vitals filed for this visit.   Subjective Assessment - 11/27/20 1639     Subjective Edema in LLE has improved, socket was too large for residual limb so pads added.  Is on antibiotics for infection but no open wounds.  Having significant pain in R knee today but would like to continue  working with the cane.    Limitations Walking;House hold activities;Standing    How long can you stand comfortably? 5 mins    Patient Stated Goals "I want to be able to walk on this leg and move around"    Currently in Pain? Yes    Pain Score 10-Worst pain ever    Pain Location Knee    Pain Orientation Right    Pain Descriptors / Indicators Constant    Pain Onset In the past 7 days                [facility] PT Assessment - 11/27/20 1642       Assessment   Medical Diagnosis L AKA    Referring Provider (PT) Meridee Score, MD    Onset Date/Surgical Date --   May 2021   Prior Therapy OP neuro PT      Precautions   Precautions Fall    Precaution Comments Microprocessor L knee      Prior Function   Level of Independence Independent with basic ADLs;Independent with transfers      Observation/Other Assessments-Edema    Edema --   Always has some edema but has improved in LLE; socket was too large causing limb to bottom out.  Pads added to socket     Transfers   Transfers Sit to Stand;Stand to Sit    Sit to Stand 6: Modified independent (Device/Increase time)    Stand to Sit 6: Modified independent (Device/Increase time)      Ambulation/Gait   Ambulation/Gait Yes    Ambulation/Gait  Assistance 5: Supervision;4: Min assist    Ambulation/Gait Assistance Details Supervision with RW with significant trunk flexion to decrease WB through R knee; Min A with SBQC - first utilized cane on R side, opposite to prosthesis - pt with significantly antalgic gait due to R knee pain.  Switched cane to L side to relieve pressure through RLE - pt reporting less pain in R knee but also reports feeling very unstable holding cane on L.    Ambulation Distance (Feet) 115 Feet    Assistive device Rolling walker;Prosthesis;Small based quad cane    Gait Pattern Step-to pattern;Decreased stance time - right;Left hip hike;Antalgic;Trunk flexed    Ambulation Surface Level;Indoor    Gait velocity 24 seconds with RW, more antalgic gait due to R knee pain today: 1.36 ft/sec    Stairs Yes    Stairs Assistance 4: Min guard;5: Supervision    Stairs Assistance Details (indicate cue type and reason) supervision when performing with bilat UE support on rails.  Min A and verbal cues for sequencing when using cane and one rail    Stair Management Technique One rail Right;Two rails;Step to pattern;Forwards;With cane    Number of Stairs 8    Height of Stairs 6      Balance   Balance Assessed Yes      Static Standing Balance   Static Standing - Balance Support Bilateral upper extremity supported;During functional activity    Static Standing - Level of Assistance 6: Modified independent (Device/Increase time)    Static Standing - Comment/# of Minutes pt reports performing standing for 10-15 minutes at a time while performing household activities             Prosthetics Assessment - 11/28/20 [place] with Prosthetic cleaning;Skin check;Residual limb care;Proper wear schedule/adjustment    Donning prosthesis  Modified independent (Device/Increase time)  Doffing prosthesis  Modified independent (Device/Increase time)    Current prosthetic wear tolerance  (days/week)  daily    Current prosthetic wear tolerance (#hours/day)  6 hours, 2x/day    Edema increased initially with LLE infection but has improved; had pads added to socket    Residual limb condition  Pt reports no open wounds but is being treated for infection; "has not come to the surface."    Prosthesis Description velcro strap suspension with microprocessor knee                PT Education - 11/28/20 1515     Education Details progress towards LTG, will renew for more visits and will restart aquatic therapy if no open wounds on residual limb.  Areas to continue to focus on    Person(s) Educated Patient    Methods Explanation    Comprehension Verbalized understanding              PT Short Term Goals - 11/19/20 1314       PT SHORT TERM GOAL #1   Title = LTGs               PT Long Term Goals - 11/27/20 1200       PT LONG TERM GOAL #1   Title Pt will be independent with final HEP in order to indicate improved functional mobility and balance with use of prosthesis.  (Target Date: 12/17/20)    Baseline reports completing 2-3/week, no consistent; will benefit from progressive HEP    Time 4    Period Weeks    Status Achieved      PT LONG TERM GOAL #2   Title Pt will be able to tolerate wearing for 90% of awake hours without skin breakdown and with good understanding of swelling mangement.    Baseline 6 hours in morning, 2x/day.  2 days of significant edema due to infection.  Edema is well controlled otherwise, had to have padding added to socket    Time 4    Period Weeks    Status Achieved      PT LONG TERM GOAL #3   Title Pt will tolerate standing x 15 mins while performing tasks intermittently without UE support and pick up item from floor with single UE support at mod I level.    Baseline 10-15 minutes at home; able to pick up object MOD I with one UE support    Time 4    Period Weeks    Status Achieved      PT LONG TERM GOAL #4   Title Pt will  improve gait speed to >/=2.00 ft/sec with LRAD and prosthesis to indicate dec fall risk.    Baseline .84 ft/sec with wide RW; 1.48 ft/sec with RW > 1.32 ft/sec with RW    Time 4    Period Weeks    Status Not Met      PT LONG TERM GOAL #5   Title Pt will ambulate x 75'' w/ SBQC (or LRAD) and prosthesis at CGA level in order to indicate improved independence with gait.    Baseline 115' with SBQC with prosthesis with min A due to significant pain in R knee    Time 4    Period Weeks    Status Partially Met      PT LONG TERM GOAL #6   Title Pt will negotiate up/down 3 steps with single rail and crutch (or LRAD) at S level in order to enter/exit  home safely.    Baseline 4 stairs with cane and one rail min A    Time 4    Period Weeks    Status Partially Met             New goals for recertification:  PT Short Term Goals - 11/28/20 1538       PT SHORT TERM GOAL #1   Title Pt will increase prosthetic wear time to 100% of awake hours but removing intermittently to clean sweat and inspect skin    Baseline 90% of awake hours    Time 4    Period Weeks    Status New    Target Date 12/28/20      PT SHORT TERM GOAL #2   Title Pt will demonstrate independence with updated HEP for balance, LE strengthening    Baseline HEP has not been updated yet    Time 4    Period Weeks    Status New    Target Date 12/28/20      PT SHORT TERM GOAL #3   Title Pt will demonstrate MOD I sit <> stand and stand pivot transfers with cane    Baseline min A with cane    Time 4    Period Weeks    Status New    Target Date 12/28/20      PT SHORT TERM GOAL #4   Title Pt will ambulate x 230' with cane over indoor surfaces with supervision.    Baseline Min A x 115' with cane    Time 4    Period Weeks    Status New    Target Date 12/28/20      PT SHORT TERM GOAL #5   Title Pt will perform gait velocity assessment with cane    Baseline has been using RW    Time 4    Period Weeks    Status New     Target Date 12/28/20             PT Long Term Goals - 11/28/20 1543       PT LONG TERM GOAL #1   Title Pt will be independent with final HEP in order to indicate improved functional mobility and balance with use of prosthesis.    Baseline reports completing 2-3/week, no consistent; will benefit from progressive HEP    Time 8    Period Weeks    Status Revised    Target Date 01/27/21      PT LONG TERM GOAL #2   Title Pt will negotiate 4 stairs with cane and one rail with supervision step to sequence; will begin to learn how to ride her hydraulic knee in order to descend alternating sequence    Baseline Min A with cane and rail, step to sequence    Time 8    Period Weeks    Status New    Target Date 01/27/21      PT LONG TERM GOAL #3   Title Pt will negotiate ramp and curb with prosthesis and cane with min A    Baseline has not performed with cane    Time 8    Period Weeks    Status New    Target Date 01/27/21      PT LONG TERM GOAL #4   Title Pt will improve gait speed to >/= 1.8 ft/sec with Cane and prosthesis to indicate dec fall risk.    Baseline has not been assessed with cane  Time 8    Period Weeks    Status New    Target Date 01/27/21      PT LONG TERM GOAL #5   Title Pt will ambulate x 100' outside over level paved surfaces with Kasandra Knudsen and supervision    Baseline 115' indoors with [facility] with prosthesis with min A due to significant pain in R knee    Time 8    Period Weeks    Status New    Target Date 01/27/21                 Plan - 11/28/20 1520     Clinical Impression Statement Pt is being treated with antibiotics for L residual limb infection; no further fevers and edema has improved.  Pt continues to experience significant R knee pain but pt is making steady progress towards goals and has met 3/6 LTG.  Pt demonstrates independence with current HEP, is tolerating prosthetic wear 90% of waking hours, is independent with donning/doffing and care of  prosthesis and residual limb.  She is able to WB and stand for 10-15 minutes at a time while performing household activities.  Pt has initiated gait and stair negotiation training with cane with min A but continues to require use of RW when ambulating outside her home due to impaired endurance, balance and significant OA pain in R knee.  Patient's gait speed with RW is unchanged.  Pt has also been participating in aquatic therapy and will return when medically cleared due to LE infection.  Pt would benefit from ongoing skilled PT to address ongoing impairments to maximize functional mobility independence and decrease falls risk with prosthesis.    Personal Factors and Comorbidities Comorbidity 3+;Past/Current Experience;Time since onset of injury/illness/exacerbation    Comorbidities see above    Examination-Activity Limitations Bend;Carry;Dressing;Stand;Stairs;Squat;Locomotion Level;Transfers    Examination-Participation Restrictions Community Activity;Meal Prep;Shop;Laundry    Stability/Clinical Decision Making --    Rehab Potential Good    PT Frequency 2x / week    PT Duration 8 weeks    PT Treatment/Interventions ADLs/Self Care Home Management;DME Instruction;Gait training;Stair training;Functional mobility training;Therapeutic activities;Therapeutic exercise;Balance training;Neuromuscular re-education;Patient/family education;Prosthetic Training;Compression bandaging;Passive range of motion;Energy conservation;Iontophoresis 42m/ml Dexamethasone;Cryotherapy;Aquatic Therapy;Manual techniques    PT Next Visit Plan April, please visually inspect her residual limb - any open wounds?  If not may be able to change WED appt to aquatic - let SVinnie Levelknow.   R knee pain is limiting gait with cane - please look at exercises for strengthening management of R knee pain; gait with SBQC, stairs with SBQC.  Update HEP as needed based on what she is doing at home already. Work on her activating knee/feeling  comfortable being on prosthesis, balance, endurance.    Consulted and Agree with Plan of Care Patient             Patient will benefit from skilled therapeutic intervention in order to improve the following deficits and impairments:  Abnormal gait, Decreased activity tolerance, Decreased balance, Decreased endurance, Decreased knowledge of precautions, Decreased knowledge of use of DME, Decreased mobility, Decreased range of motion, Decreased strength, Difficulty walking, Increased edema, Impaired perceived functional ability, Impaired flexibility, Impaired sensation, Postural dysfunction, Improper body mechanics, Prosthetic Dependency, Pain  Visit Diagnosis: Unsteadiness on feet  Other abnormalities of gait and mobility  Abnormal posture  Repeated falls  Chronic pain of right knee  Muscle weakness (generalized)     Problem List Patient Active Problem List   Diagnosis Date Noted   Cough  05/26/2020   Chronic pain syndrome 01/16/2020   Hx of AKA (above knee amputation), left (HCC)    Symptomatic anemia 10/21/2019   Anemia 10/21/2019   Acute respiratory failure with hypoxia ([place]) 10/21/2019   Blurry vision, right eye 10/08/2019   Tobacco use 03/01/2019   Phantom limb pain ([place]) 11/08/2018   Current moderate episode of major depressive disorder ([place])    Hyperkalemia    Anemia of chronic disease    Severe protein-calorie malnutrition ([place])    Fall    Status post below knee amputation, left ([place]) 04/11/2017   Bipolar affective disorder ([place])    Heart failure with preserved ejection fraction ([place]) 02/09/2017   Routine adult health maintenance 12/08/2016   Chronic pain of right knee 09/08/2016   Chronic kidney disease (CKD), stage III (moderate) ([place]) 07/15/2016   OSA (obstructive sleep apnea) 05/13/2016   Vaginal irritation 03/14/2016   Morbid obesity due to excess calories ([place]) 11/27/2015   Subclinical hypothyroidism 04/25/2013   Controlled type 2 diabetes mellitus  ([place]) 07/12/2012   Carpal tunnel syndrome, bilateral 01/25/2012   Diabetic polyneuropathy associated with type 2 diabetes mellitus ([place]) 07/04/2011   Anxiety and depression 06/02/2011   GERD 12/06/2007   Hyperlipidemia 08/29/2007   Hypertension associated with diabetes ([place]) 08/29/2007    Rico Junker, PT, DPT 11/28/20    3:32 PM    [facility] [address] [place], [place], [address] Phone: [phone]   Fax:  [phone]  Name: TYMEKA PRIVETTE MRN: [id number] Date of Birth: 05/31/1975

## 2020-11-30 ENCOUNTER — Other Ambulatory Visit: Payer: Self-pay

## 2020-11-30 ENCOUNTER — Ambulatory Visit: Payer: Medicare HMO | Admitting: Physical Therapy

## 2020-11-30 DIAGNOSIS — M6281 Muscle weakness (generalized): Secondary | ICD-10-CM

## 2020-11-30 DIAGNOSIS — M25652 Stiffness of left hip, not elsewhere classified: Secondary | ICD-10-CM | POA: Diagnosis not present

## 2020-11-30 DIAGNOSIS — G8929 Other chronic pain: Secondary | ICD-10-CM | POA: Diagnosis not present

## 2020-11-30 DIAGNOSIS — R293 Abnormal posture: Secondary | ICD-10-CM | POA: Diagnosis not present

## 2020-11-30 DIAGNOSIS — R2689 Other abnormalities of gait and mobility: Secondary | ICD-10-CM | POA: Diagnosis not present

## 2020-11-30 DIAGNOSIS — M25561 Pain in right knee: Secondary | ICD-10-CM | POA: Diagnosis not present

## 2020-11-30 DIAGNOSIS — R2681 Unsteadiness on feet: Secondary | ICD-10-CM | POA: Diagnosis not present

## 2020-11-30 DIAGNOSIS — R296 Repeated falls: Secondary | ICD-10-CM | POA: Diagnosis not present

## 2020-11-30 NOTE — Therapy (Signed)
[facility] [address] [place], [place], [address] Phone: [phone]   Fax:  [phone]  Physical Therapy Treatment  Patient Details  Name: [name] [name] MRN: [id number] Date of Birth: February 09, 1976 Referring Provider (PT): Meridee Score, MD   Encounter Date: 11/30/2020   PT End of Session - 11/30/20 1310     Visit Number 13    Number of Visits 28    Date for PT Re-Evaluation 01/27/21    Authorization Type [employer] [place] and Medicaid (6 visits approved 6/20 - 11/27/20); have requested 16 more visits (2x/week x 8 more weeks)    Progress Note Due on Visit 20    PT Start Time 1145    PT Stop Time 0177    PT Time Calculation (min) 50 min    Equipment Utilized During Treatment Gait belt    Activity Tolerance Patient tolerated treatment well    Behavior During Therapy [facility] for tasks assessed/performed             Past Medical History:  Diagnosis Date   Abdominal muscle pain 09/08/2016   Abnormal Pap smear of cervix 2009   Abscess    history of multiple abscesses   Acute bilateral low back pain 02/13/2017   Acute blood loss anemia    Acute on chronic renal failure ([place]) 07/12/2012   Acute renal failure ([place]) 07/12/2012   Adjustment disorder with depressed mood 03/26/2017   AKI (acute kidney injury) ([place])    Anemia of chronic disease 2002   Anxiety    Panic attacks   Bilateral lower extremity edema 05/13/2016   Bipolar disorder ([place])    Cataract    B/L cataract   Cellulitis 05/21/2014   right eye   Chronic bronchitis ([place])    "get it q yr" (05/13/2013)   Chronic pain    Dehiscence of amputation stump ([place])    Depression    Edema of lower extremity    Endocarditis 2002   subacute bacterial endocarditis.    Family history of anesthesia complication    "my mom has a hard time coming out from under"   Fibromyalgia    GERD (gastroesophageal reflux disease)    occ   Heart murmur    Herpes simplex type 1  infection 01/16/2018   History of blood transfusion    "just low blood count" (05/13/2013)   Hyperlipidemia    Hypertension    Hypothyroidism    Hypothyroidism, adult 03/21/2014   Hypoxia    Leukocytosis    Necrosis (HCC)    and ulceration   Necrotizing fasciitis s/p OR debridements 07/06/2012   Obesity    OSA on CPAP    does not wear all the time   Peripheral neuropathy    Pneumonia    Type II diabetes mellitus ([place])    Type  II   Wound dehiscence 09/04/2019    Past Surgical History:  Procedure Laterality Date   ABDOMINAL AORTOGRAM W/LOWER EXTREMITY Left 03/19/2019   Procedure: ABDOMINAL AORTOGRAM W/LOWER EXTREMITY;  Surgeon: Serafina Mitchell, MD;  Location: [facility];  Service: Cardiovascular;  Laterality: Left;   ABOVE KNEE LEG AMPUTATION Left 10/11/2019   wound dehisence    AIR/FLUID EXCHANGE Right 12/31/2019   Procedure: AIR/FLUID EXCHANGE;  Surgeon: Jalene Mullet, MD;  Location: [facility];  Service: Ophthalmology;  Laterality: Right;   AMPUTATION Left 04/07/2017   Procedure: LEFT BELOW KNEE AMPUTATION;  Surgeon: Newt Minion, MD;  Location: [facility];  Service: Orthopedics;  Laterality: Left;   AMPUTATION Left 10/11/2019   Procedure: LEFT ABOVE KNEE AMPUTATION;  Surgeon: Newt Minion, MD;  Location: [facility];  Service: Orthopedics;  Laterality: Left;   CATARACT EXTRACTION W/PHACO Right 02/11/2020   Procedure: CATARACT EXTRACTION PHACO AND INTRAOCULAR LENS PLACEMENT (IOC);  Surgeon: Jalene Mullet, MD;  Location: [facility];  Service: Ophthalmology;  Laterality: Right;   CORONARY ARTERY BYPASS GRAFT     EYE SURGERY     lazer   GAS INSERTION Right 12/31/2019   Procedure: INSERTION OF GAS;  Surgeon: Jalene Mullet, MD;  Location: [facility];  Service: Ophthalmology;  Laterality: Right;   INCISION AND DRAINAGE ABSCESS     multiple I&Ds   INCISION AND DRAINAGE ABSCESS Left 07/09/2012   Procedure: DRESSING CHANGE, THIGH WOUND;  Surgeon: Harl Bowie, MD;  Location: [facility];   Service: General;  Laterality: Left;   INCISION AND DRAINAGE OF WOUND Left 07/07/2012   Procedure: IRRIGATION AND DEBRIDEMENT WOUND;  Surgeon: Harl Bowie, MD;  Location: [facility];  Service: General;  Laterality: Left;   INCISION AND DRAINAGE PERIRECTAL ABSCESS Left 07/14/2012   Procedure: DEBRIDEMENT OF SKIN & SOFT TISSUE; DRESSING CHANGE UNDER ANESTHESIA;  Surgeon: Gayland Curry, MD,FACS;  Location: [facility];  Service: General;  Laterality: Left;   INCISION AND DRAINAGE PERIRECTAL ABSCESS Left 07/16/2012   Procedure: I&D Left Thigh;  Surgeon: Gwenyth Ober, MD;  Location: [facility];  Service: General;  Laterality: Left;   INCISION AND DRAINAGE PERIRECTAL ABSCESS N/A 01/05/2015   Procedure: IRRIGATION AND DEBRIDEMENT PERIRECTAL ABSCESS;  Surgeon: Donnie Mesa, MD;  Location: [facility];  Service: General;  Laterality: N/A;   IRRIGATION AND DEBRIDEMENT ABSCESS Left 07/06/2012   Procedure: IRRIGATION AND DEBRIDEMENT ABSCESS BUTTOCKS AND THIGH;  Surgeon: Shann Medal, MD;  Location: [facility];  Service: General;  Laterality: Left;   IRRIGATION AND DEBRIDEMENT ABSCESS Left 08/10/2012   Procedure: IRRIGATION AND DEBRIDEMENT ABSCESS;  Surgeon: Madilyn Hook, DO;  Location: [facility];  Service: General;  Laterality: Left;   PARS PLANA VITRECTOMY Right 12/31/2019   Procedure: PARS PLANA VITRECTOMY WITH 25 GAUGE;  Surgeon: Jalene Mullet, MD;  Location: [facility];  Service: Ophthalmology;  Laterality: Right;   PHOTOCOAGULATION WITH LASER Right 12/31/2019   Procedure: PHOTOCOAGULATION WITH LASER;  Surgeon: Jalene Mullet, MD;  Location: [facility];  Service: Ophthalmology;  Laterality: Right;   REPAIR OF COMPLEX TRACTION RETINAL DETACHMENT Right 12/31/2019   Procedure: REPAIR OF COMPLEX TRACTION RETINAL;  Surgeon: Jalene Mullet, MD;  Location: [facility];  Service: Ophthalmology;  Laterality: Right;   STUMP REVISION Left 06/09/2017   Procedure: REVISION LEFT BELOW KNEE AMPUTATION;  Surgeon: Newt Minion, MD;  Location: [facility];  Service:  Orthopedics;  Laterality: Left;   STUMP REVISION Left 09/04/2019   Procedure: LEFT BELOW KNEE AMPUTATION REVISION;  Surgeon: Newt Minion, MD;  Location: [facility];  Service: Orthopedics;  Laterality: Left;   STUMP REVISION Left 09/20/2019   Procedure: LEFT BELOW KNEE AMPUTATION REVISION;  Surgeon: Newt Minion, MD;  Location: [facility];  Service: Orthopedics;  Laterality: Left;   TONSILLECTOMY  1994    There were no vitals filed for this visit.   Subjective Assessment - 11/30/20 1152     Subjective Continued soreness on L LE and R knee. Reports improvement with L LE and no open wounds. Pt has been wearing prosthetic 6 hrs x2 during the day. Pt states that due to transportation she can't come to  aquatics this Wednesday -- will need to restart next week.    Limitations Walking;House hold activities;Standing    How long can you stand comfortably? 5 mins    Patient Stated Goals "I want to be able to walk on this leg and move around"    Currently in Pain? Yes    Pain Score 8     Pain Location Knee    Pain Orientation Right    Pain Descriptors / Indicators Constant    Pain Type Chronic pain    Pain Onset In the past 7 days                [facility] PT Assessment - 11/30/20 0001       Observation/Other Assessments   Observations Increased R knee valgus -- increased upon weight bearing. Wide BOS with standing.      Strength   Overall Strength Comments Poor R quad strength -- difficulty performing SLR      Special Tests    Special Tests Laxity/Instability Tests    Laxity/Instability  other;other2      Other   Findings Positive    comment LCL knee laxity with testing      other   Findings Positive    Comment MCL knee laxity with testing      Ambulation/Gait   Ambulation/Gait Assistance 4: Min assist    Ambulation Distance (Feet) 220 Feet    Assistive device Small based quad cane;Prosthesis    Gait Pattern Step-to pattern;Decreased stance time - left;Left hip hike;Left  circumduction;Antalgic;Lateral trunk lean to right;Trunk flexed    Ambulation Surface Level;Indoor                           [facility] Adult PT Treatment/Exercise - 11/30/20 0001       Transfers   Sit to Stand 6: Modified independent (Device/Increase time)    Stand to Sit 6: Modified independent (Device/Increase time)      Ambulation/Gait   Gait Comments Decreased L lateral trunk weightshift to L LE. Required 2 seated rest breaks.      Knee/Hip Exercises: Supine   Hip Adduction Isometric Strengthening;Both;10 reps   3 sec hold   Single Leg Bridge Strengthening;Right;2 sets;10 reps    Straight Leg Raises Strengthening;2 sets;10 reps      Knee/Hip Exercises: Sidelying   Clams 2x10   green tband     Prosthetics   Prosthetic Care Comments  Screw for pt's suspension strap very loose resulting in excess rotation initially during gait walking into clinic. Adjusted and tightened strap with less rotation noted during amb.    Current prosthetic wear tolerance (days/week)  daily    Current prosthetic wear tolerance (#hours/day)  6 hours, 2x/day    Residual limb condition  no open wounds, no redness or edema noted    Donning Prosthesis Independent                    PT Education - 11/30/20 1311     Education Details Discussed pool schedule/plan for return to aquatics, R knee exam findings, and POC for R knee              PT Short Term Goals - 11/28/20 1538       PT SHORT TERM GOAL #1   Title Pt will increase prosthetic wear time to 100% of awake hours but removing intermittently to clean sweat and inspect skin    Baseline 90% of  awake hours    Time 4    Period Weeks    Status New    Target Date 12/28/20      PT SHORT TERM GOAL #2   Title Pt will demonstrate independence with updated HEP for balance, LE strengthening    Baseline HEP has not been updated yet    Time 4    Period Weeks    Status New    Target Date 12/28/20      PT SHORT TERM GOAL #3    Title Pt will demonstrate MOD I sit <> stand and stand pivot transfers with cane    Baseline min A with cane    Time 4    Period Weeks    Status New    Target Date 12/28/20      PT SHORT TERM GOAL #4   Title Pt will ambulate x 230' with cane over indoor surfaces with supervision.    Baseline Min A x 115' with cane    Time 4    Period Weeks    Status New    Target Date 12/28/20      PT SHORT TERM GOAL #5   Title Pt will perform gait velocity assessment with cane    Baseline has been using RW    Time 4    Period Weeks    Status New    Target Date 12/28/20               PT Long Term Goals - 11/28/20 1543       PT LONG TERM GOAL #1   Title Pt will be independent with final HEP in order to indicate improved functional mobility and balance with use of prosthesis.    Baseline reports completing 2-3/week, no consistent; will benefit from progressive HEP    Time 8    Period Weeks    Status Revised    Target Date 01/27/21      PT LONG TERM GOAL #2   Title Pt will negotiate 4 stairs with cane and one rail with supervision step to sequence; will begin to learn how to ride her hydraulic knee in order to descend alternating sequence    Baseline Min A with cane and rail, step to sequence    Time 8    Period Weeks    Status New    Target Date 01/27/21      PT LONG TERM GOAL #3   Title Pt will negotiate ramp and curb with prosthesis and cane with min A    Baseline has not performed with cane    Time 8    Period Weeks    Status New    Target Date 01/27/21      PT LONG TERM GOAL #4   Title Pt will improve gait speed to >/= 1.8 ft/sec with Cane and prosthesis to indicate dec fall risk.    Baseline has not been assessed with cane    Time 8    Period Weeks    Status New    Target Date 01/27/21      PT LONG TERM GOAL #5   Title Pt will ambulate x 100' outside over level paved surfaces with Kasandra Knudsen and supervision    Baseline 115' indoors with [facility] with prosthesis with min  A due to significant pain in R knee    Time 8    Period Weeks    Status New    Target Date 01/27/21  Plan - 11/30/20 1302     Clinical Impression Statement PT assessed R knee as the pain limits her amb the most. Found to have poor medial and lateral knee stabilty -- MCL and LCL both appear very lax with valgus/varus testing. Pt with weak R quad i.e. fatigues and has difficulty with SLR) compared to amount of weight that needs to placed on it during amb. Initiated R LE strengthening HEP. Discussed R knee brace may be helpful to provide added stability in the meanwhile. In terms of her prosthesis, had to readjust/tighten screw for her suspension strap to decrease excess IR with amb. Rest of session working on amb. Pt to return to aquatics next week.    Personal Factors and Comorbidities Comorbidity 3+;Past/Current Experience;Time since onset of injury/illness/exacerbation    Comorbidities see above    Examination-Activity Limitations Bend;Carry;Dressing;Stand;Stairs;Squat;Locomotion Level;Transfers    Examination-Participation Restrictions Community Activity;Meal Prep;Shop;Laundry    Rehab Potential Good    PT Frequency 2x / week    PT Duration 8 weeks    PT Treatment/Interventions ADLs/Self Care Home Management;DME Instruction;Gait training;Stair training;Functional mobility training;Therapeutic activities;Therapeutic exercise;Balance training;Neuromuscular re-education;Patient/family education;Prosthetic Training;Compression bandaging;Passive range of motion;Energy conservation;Iontophoresis 4mg /ml Dexamethasone;Cryotherapy;Aquatic Therapy;Manual techniques    PT Next Visit Plan R knee pain is limiting gait with cane - continue R knee stabilizing exercises in NWB. Continue gait with SBQC, stairs with SBQC. Update HEP as needed based on what she is doing at home already. Work on her activating knee/feeling comfortable being on prosthesis, balance, endurance.    PT Home  Exercise Plan Access Code: [email or web address]    Consulted and Agree with Plan of Care Patient             Patient will benefit from skilled therapeutic intervention in order to improve the following deficits and impairments:  Abnormal gait, Decreased activity tolerance, Decreased balance, Decreased endurance, Decreased knowledge of precautions, Decreased knowledge of use of DME, Decreased mobility, Decreased range of motion, Decreased strength, Difficulty walking, Increased edema, Impaired perceived functional ability, Impaired flexibility, Impaired sensation, Postural dysfunction, Improper body mechanics, Prosthetic Dependency, Pain  Visit Diagnosis: Unsteadiness on feet  Other abnormalities of gait and mobility  Abnormal posture  Repeated falls  Chronic pain of right knee  Muscle weakness (generalized)  Hip stiffness, left     Problem List Patient Active Problem List   Diagnosis Date Noted   Cough 05/26/2020   Chronic pain syndrome 01/16/2020   Hx of AKA (above knee amputation), left (HCC)    Symptomatic anemia 10/21/2019   Anemia 10/21/2019   Acute respiratory failure with hypoxia ([place]) 10/21/2019   Blurry vision, right eye 10/08/2019   Tobacco use 03/01/2019   Phantom limb pain ([place]) 11/08/2018   Current moderate episode of major depressive disorder ([place])    Hyperkalemia    Anemia of chronic disease    Severe protein-calorie malnutrition ([place])    Fall    Status post below knee amputation, left ([place]) 04/11/2017   Bipolar affective disorder ([place])    Heart failure with preserved ejection fraction ([place]) 02/09/2017   Routine adult health maintenance 12/08/2016   Chronic pain of right knee 09/08/2016   Chronic kidney disease (CKD), stage III (moderate) ([place]) 07/15/2016   OSA (obstructive sleep apnea) 05/13/2016   Vaginal irritation 03/14/2016   Morbid obesity due to excess calories ([place]) 11/27/2015   Subclinical hypothyroidism 04/25/2013   Controlled type 2 diabetes  mellitus ([place]) 07/12/2012   Carpal tunnel syndrome, bilateral 01/25/2012   Diabetic polyneuropathy associated with type 2  diabetes mellitus ([place]) 07/04/2011   Anxiety and depression 06/02/2011   GERD 12/06/2007   Hyperlipidemia 08/29/2007   Hypertension associated with diabetes [facility]) 08/29/2007    Bravlio Luca April Ma L Laraina Sulton PT, DPT 11/30/2020, 1:13 PM  [facility] [address] [place], [place], [address] Phone: [phone]   Fax:  [phone]  Name: [name] [name] MRN: [id number] Date of Birth: 1975-12-05

## 2020-12-01 ENCOUNTER — Encounter: Payer: Self-pay | Admitting: *Deleted

## 2020-12-02 ENCOUNTER — Other Ambulatory Visit: Payer: Self-pay

## 2020-12-02 ENCOUNTER — Ambulatory Visit: Payer: Medicare HMO | Admitting: Physical Therapy

## 2020-12-02 DIAGNOSIS — R2689 Other abnormalities of gait and mobility: Secondary | ICD-10-CM

## 2020-12-02 DIAGNOSIS — R293 Abnormal posture: Secondary | ICD-10-CM

## 2020-12-02 DIAGNOSIS — M6281 Muscle weakness (generalized): Secondary | ICD-10-CM | POA: Diagnosis not present

## 2020-12-02 DIAGNOSIS — R2681 Unsteadiness on feet: Secondary | ICD-10-CM

## 2020-12-02 DIAGNOSIS — M25561 Pain in right knee: Secondary | ICD-10-CM | POA: Diagnosis not present

## 2020-12-02 DIAGNOSIS — M25652 Stiffness of left hip, not elsewhere classified: Secondary | ICD-10-CM

## 2020-12-02 DIAGNOSIS — G8929 Other chronic pain: Secondary | ICD-10-CM | POA: Diagnosis not present

## 2020-12-02 DIAGNOSIS — R296 Repeated falls: Secondary | ICD-10-CM | POA: Diagnosis not present

## 2020-12-02 NOTE — Therapy (Signed)
[facility] [address] [place], [place], [address] Phone: [phone]   Fax:  [phone]  Physical Therapy Treatment  Patient Details  Name: [name] [name] MRN: [id number] Date of Birth: 06/13/1975 Referring Provider (PT): Meridee Score, MD   Encounter Date: 12/02/2020   PT End of Session - 12/02/20 1311     Visit Number 14    Number of Visits 28    Date for PT Re-Evaluation 01/27/21    Authorization Type [employer] [place] and Medicaid (6 visits approved 6/20 - 11/27/20); have requested 16 more visits (2x/week x 8 more weeks)    Progress Note Due on Visit 20    PT Start Time 1105    PT Stop Time 9381    PT Time Calculation (min) 40 min    Equipment Utilized During Treatment Gait belt    Activity Tolerance Patient tolerated treatment well    Behavior During Therapy [facility] for tasks assessed/performed             Past Medical History:  Diagnosis Date   Abdominal muscle pain 09/08/2016   Abnormal Pap smear of cervix 2009   Abscess    history of multiple abscesses   Acute bilateral low back pain 02/13/2017   Acute blood loss anemia    Acute on chronic renal failure ([place]) 07/12/2012   Acute renal failure ([place]) 07/12/2012   Adjustment disorder with depressed mood 03/26/2017   AKI (acute kidney injury) ([place])    Anemia of chronic disease 2002   Anxiety    Panic attacks   Bilateral lower extremity edema 05/13/2016   Bipolar disorder ([place])    Cataract    B/L cataract   Cellulitis 05/21/2014   right eye   Chronic bronchitis ([place])    "get it q yr" (05/13/2013)   Chronic pain    Dehiscence of amputation stump ([place])    Depression    Edema of lower extremity    Endocarditis 2002   subacute bacterial endocarditis.    Family history of anesthesia complication    "my mom has a hard time coming out from under"   Fibromyalgia    GERD (gastroesophageal reflux disease)    occ   Heart murmur    Herpes simplex type 1  infection 01/16/2018   History of blood transfusion    "just low blood count" (05/13/2013)   Hyperlipidemia    Hypertension    Hypothyroidism    Hypothyroidism, adult 03/21/2014   Hypoxia    Leukocytosis    Necrosis (HCC)    and ulceration   Necrotizing fasciitis s/p OR debridements 07/06/2012   Obesity    OSA on CPAP    does not wear all the time   Peripheral neuropathy    Pneumonia    Type II diabetes mellitus ([facility])    Type  II   Wound dehiscence 09/04/2019    Past Surgical History:  Procedure Laterality Date   ABDOMINAL AORTOGRAM W/LOWER EXTREMITY Left 03/19/2019   Procedure: ABDOMINAL AORTOGRAM W/LOWER EXTREMITY;  Surgeon: Serafina Mitchell, MD;  Location: [facility];  Service: Cardiovascular;  Laterality: Left;   ABOVE KNEE LEG AMPUTATION Left 10/11/2019   wound dehisence    AIR/FLUID EXCHANGE Right 12/31/2019   Procedure: AIR/FLUID EXCHANGE;  Surgeon: Jalene Mullet, MD;  Location: [facility];  Service: Ophthalmology;  Laterality: Right;   AMPUTATION Left 04/07/2017   Procedure: LEFT BELOW KNEE AMPUTATION;  Surgeon: Newt Minion, MD;  Location: [facility];  Service: Orthopedics;  Laterality: Left;   AMPUTATION Left 10/11/2019   Procedure: LEFT ABOVE KNEE AMPUTATION;  Surgeon: Newt Minion, MD;  Location: [facility];  Service: Orthopedics;  Laterality: Left;   CATARACT EXTRACTION W/PHACO Right 02/11/2020   Procedure: CATARACT EXTRACTION PHACO AND INTRAOCULAR LENS PLACEMENT (IOC);  Surgeon: Jalene Mullet, MD;  Location: [facility];  Service: Ophthalmology;  Laterality: Right;   CORONARY ARTERY BYPASS GRAFT     EYE SURGERY     lazer   GAS INSERTION Right 12/31/2019   Procedure: INSERTION OF GAS;  Surgeon: Jalene Mullet, MD;  Location: [facility];  Service: Ophthalmology;  Laterality: Right;   INCISION AND DRAINAGE ABSCESS     multiple I&Ds   INCISION AND DRAINAGE ABSCESS Left 07/09/2012   Procedure: DRESSING CHANGE, THIGH WOUND;  Surgeon: Harl Bowie, MD;  Location: [facility];   Service: General;  Laterality: Left;   INCISION AND DRAINAGE OF WOUND Left 07/07/2012   Procedure: IRRIGATION AND DEBRIDEMENT WOUND;  Surgeon: Harl Bowie, MD;  Location: [facility];  Service: General;  Laterality: Left;   INCISION AND DRAINAGE PERIRECTAL ABSCESS Left 07/14/2012   Procedure: DEBRIDEMENT OF SKIN & SOFT TISSUE; DRESSING CHANGE UNDER ANESTHESIA;  Surgeon: Gayland Curry, MD,FACS;  Location: [facility];  Service: General;  Laterality: Left;   INCISION AND DRAINAGE PERIRECTAL ABSCESS Left 07/16/2012   Procedure: I&D Left Thigh;  Surgeon: Gwenyth Ober, MD;  Location: [facility];  Service: General;  Laterality: Left;   INCISION AND DRAINAGE PERIRECTAL ABSCESS N/A 01/05/2015   Procedure: IRRIGATION AND DEBRIDEMENT PERIRECTAL ABSCESS;  Surgeon: Donnie Mesa, MD;  Location: [facility];  Service: General;  Laterality: N/A;   IRRIGATION AND DEBRIDEMENT ABSCESS Left 07/06/2012   Procedure: IRRIGATION AND DEBRIDEMENT ABSCESS BUTTOCKS AND THIGH;  Surgeon: Shann Medal, MD;  Location: [facility];  Service: General;  Laterality: Left;   IRRIGATION AND DEBRIDEMENT ABSCESS Left 08/10/2012   Procedure: IRRIGATION AND DEBRIDEMENT ABSCESS;  Surgeon: Madilyn Hook, DO;  Location: [facility];  Service: General;  Laterality: Left;   PARS PLANA VITRECTOMY Right 12/31/2019   Procedure: PARS PLANA VITRECTOMY WITH 25 GAUGE;  Surgeon: Jalene Mullet, MD;  Location: [facility];  Service: Ophthalmology;  Laterality: Right;   PHOTOCOAGULATION WITH LASER Right 12/31/2019   Procedure: PHOTOCOAGULATION WITH LASER;  Surgeon: Jalene Mullet, MD;  Location: [facility];  Service: Ophthalmology;  Laterality: Right;   REPAIR OF COMPLEX TRACTION RETINAL DETACHMENT Right 12/31/2019   Procedure: REPAIR OF COMPLEX TRACTION RETINAL;  Surgeon: Jalene Mullet, MD;  Location: [facility];  Service: Ophthalmology;  Laterality: Right;   STUMP REVISION Left 06/09/2017   Procedure: REVISION LEFT BELOW KNEE AMPUTATION;  Surgeon: Newt Minion, MD;  Location: [facility];  Service:  Orthopedics;  Laterality: Left;   STUMP REVISION Left 09/04/2019   Procedure: LEFT BELOW KNEE AMPUTATION REVISION;  Surgeon: Newt Minion, MD;  Location: [facility];  Service: Orthopedics;  Laterality: Left;   STUMP REVISION Left 09/20/2019   Procedure: LEFT BELOW KNEE AMPUTATION REVISION;  Surgeon: Newt Minion, MD;  Location: [facility];  Service: Orthopedics;  Laterality: Left;   TONSILLECTOMY  1994    There were no vitals filed for this visit.   Subjective Assessment - 12/02/20 1107     Subjective Pt reports some soreness after last session. Pt remains sore. Pt states exercises have been going good.    Limitations Walking;House hold activities;Standing    How long can you stand comfortably? 5 mins  Patient Stated Goals "I want to be able to walk on this leg and move around"    Pain Onset In the past 7 days                               [facility] Adult PT Treatment/Exercise - 12/02/20 0001       Ambulation/Gait   Ambulation/Gait Assistance 4: Min guard    Ambulation Distance (Feet) 115 Feet    Assistive device Small based quad cane;Prosthesis    Gait Pattern Step-to pattern;Decreased stance time - left;Left hip hike;Left circumduction;Antalgic;Lateral trunk lean to right;Trunk flexed    Ambulation Surface Level;Indoor    Pre-Gait Activities in // bars: Practice decreasing hip rotation vs hip flex/ext with forward walking 2x10' using bilat rails and then with rail on R, and then rail on L (to encourage L LE weight shift). Increased L tren[place] noted.    Gait Comments Decreased L lateral trunk weight shift; limited due to pain and fatigue      Knee/Hip Exercises: Standing   Other Standing Knee Exercises in // bars: Side stepping 2x10'; backwards walking 2x10'      Knee/Hip Exercises: Seated   Clamshell with [employer]raBand Green   3x10     Knee/Hip Exercises: Supine   Hip Adduction Isometric Strengthening;Both;10 reps;2 sets    Single Leg Bridge Strengthening;Right;2  sets;10 reps    Straight Leg Raises Strengthening;2 sets;10 reps      Knee/Hip Exercises: Sidelying   Clams 2x10   green tband     Prosthetics   Prosthetic Care Comments  Slight internal rotation noted; improved after tightening pt's suspension strap    Current prosthetic wear tolerance (days/week)  daily    Current prosthetic wear tolerance (#hours/day)  6 hours, 2x/day    Edema No edema noted; pt feels that her prosthetic feels too big for her L LE today    Residual limb condition  no open wounds, no redness or edema noted                      PT Short Term Goals - 11/28/20 1538       PT SHORT TERM GOAL #1   Title Pt will increase prosthetic wear time to 100% of awake hours but removing intermittently to clean sweat and inspect skin    Baseline 90% of awake hours    Time 4    Period Weeks    Status New    Target Date 12/28/20      PT SHORT TERM GOAL #2   Title Pt will demonstrate independence with updated HEP for balance, LE strengthening    Baseline HEP has not been updated yet    Time 4    Period Weeks    Status New    Target Date 12/28/20      PT SHORT TERM GOAL #3   Title Pt will demonstrate MOD I sit <> stand and stand pivot transfers with cane    Baseline min A with cane    Time 4    Period Weeks    Status New    Target Date 12/28/20      PT SHORT TERM GOAL #4   Title Pt will ambulate x 230' with cane over indoor surfaces with supervision.    Baseline Min A x 115' with cane    Time 4    Period Weeks    Status New  Target Date 12/28/20      PT SHORT TERM GOAL #5   Title Pt will perform gait velocity assessment with cane    Baseline has been using RW    Time 4    Period Weeks    Status New    Target Date 12/28/20               PT Long Term Goals - 11/28/20 1543       PT LONG TERM GOAL #1   Title Pt will be independent with final HEP in order to indicate improved functional mobility and balance with use of prosthesis.     Baseline reports completing 2-3/week, no consistent; will benefit from progressive HEP    Time 8    Period Weeks    Status Revised    Target Date 01/27/21      PT LONG TERM GOAL #2   Title Pt will negotiate 4 stairs with cane and one rail with supervision step to sequence; will begin to learn how to ride her hydraulic knee in order to descend alternating sequence    Baseline Min A with cane and rail, step to sequence    Time 8    Period Weeks    Status New    Target Date 01/27/21      PT LONG TERM GOAL #3   Title Pt will negotiate ramp and curb with prosthesis and cane with min A    Baseline has not performed with cane    Time 8    Period Weeks    Status New    Target Date 01/27/21      PT LONG TERM GOAL #4   Title Pt will improve gait speed to >/= 1.8 ft/sec with Cane and prosthesis to indicate dec fall risk.    Baseline has not been assessed with cane    Time 8    Period Weeks    Status New    Target Date 01/27/21      PT LONG TERM GOAL #5   Title Pt will ambulate x 100' outside over level paved surfaces with Kasandra Knudsen and supervision    Baseline 115' indoors with [facility] with prosthesis with min A due to significant pain in R knee    Time 8    Period Weeks    Status New    Target Date 01/27/21                   Plan - 12/02/20 1309     Clinical Impression Statement Reviewed pt's current HEP to ensure she is performing with good form. Remains most challenged with bridging, SLR and R hip clamshell. Rest of treatment focused on gait activities in // bars -- worked on side stepping/weight shifting and backwards stepping. Cues to shift more weight to L LE and decrease pelvic rotation to advance bilat LE.    Personal Factors and Comorbidities Comorbidity 3+;Past/Current Experience;Time since onset of injury/illness/exacerbation    Comorbidities see above    Examination-Activity Limitations Bend;Carry;Dressing;Stand;Stairs;Squat;Locomotion Level;Transfers     Examination-Participation Restrictions Community Activity;Meal Prep;Shop;Laundry    Rehab Potential Good    PT Frequency 2x / week    PT Duration 8 weeks    PT Treatment/Interventions ADLs/Self Care Home Management;DME Instruction;Gait training;Stair training;Functional mobility training;Therapeutic activities;Therapeutic exercise;Balance training;Neuromuscular re-education;Patient/family education;Prosthetic Training;Compression bandaging;Passive range of motion;Energy conservation;Iontophoresis 4mg /ml Dexamethasone;Cryotherapy;Aquatic Therapy;Manual techniques    PT Next Visit Plan R knee pain is limiting gait with cane - continue R knee stabilizing  exercises in NWB. Continue gait with SBQC, stairs with SBQC. Update HEP as needed based on what she is doing at home already. Work on her activating knee/feeling comfortable being on prosthesis, balance, endurance.    PT Home Exercise Plan Access Code: [email or web address]    Consulted and Agree with Plan of Care Patient             Patient will benefit from skilled therapeutic intervention in order to improve the following deficits and impairments:  Abnormal gait, Decreased activity tolerance, Decreased balance, Decreased endurance, Decreased knowledge of precautions, Decreased knowledge of use of DME, Decreased mobility, Decreased range of motion, Decreased strength, Difficulty walking, Increased edema, Impaired perceived functional ability, Impaired flexibility, Impaired sensation, Postural dysfunction, Improper body mechanics, Prosthetic Dependency, Pain  Visit Diagnosis: Unsteadiness on feet  Other abnormalities of gait and mobility  Abnormal posture  Repeated falls  Chronic pain of right knee  Muscle weakness (generalized)  Hip stiffness, left     Problem List Patient Active Problem List   Diagnosis Date Noted   Cough 05/26/2020   Chronic pain syndrome 01/16/2020   Hx of AKA (above knee amputation), left (HCC)    Symptomatic anemia  10/21/2019   Anemia 10/21/2019   Acute respiratory failure with hypoxia ([place]) 10/21/2019   Blurry vision, right eye 10/08/2019   Tobacco use 03/01/2019   Phantom limb pain ([place]) 11/08/2018   Current moderate episode of major depressive disorder ([place])    Hyperkalemia    Anemia of chronic disease    Severe protein-calorie malnutrition ([place])    Fall    Status post below knee amputation, left ([place]) 04/11/2017   Bipolar affective disorder ([place])    Heart failure with preserved ejection fraction ([place]) 02/09/2017   Routine adult health maintenance 12/08/2016   Chronic pain of right knee 09/08/2016   Chronic kidney disease (CKD), stage III (moderate) ([place]) 07/15/2016   OSA (obstructive sleep apnea) 05/13/2016   Vaginal irritation 03/14/2016   Morbid obesity due to excess calories ([place]) 11/27/2015   Subclinical hypothyroidism 04/25/2013   Controlled type 2 diabetes mellitus ([place]) 07/12/2012   Carpal tunnel syndrome, bilateral 01/25/2012   Diabetic polyneuropathy associated with type 2 diabetes mellitus ([place]) 07/04/2011   Anxiety and depression 06/02/2011   GERD 12/06/2007   Hyperlipidemia 08/29/2007   Hypertension associated with diabetes [facility]) 08/29/2007    Bowe Sidor April Ma L Lucillia Corson PT, DPT 12/02/2020, 1:12 PM  [facility] [address] [place], [place], [address] Phone: [phone]   Fax:  [phone]  Name: [name] [name] MRN: [id number] Date of Birth: 1975/10/10

## 2020-12-07 ENCOUNTER — Other Ambulatory Visit: Payer: Self-pay

## 2020-12-07 ENCOUNTER — Ambulatory Visit: Payer: Medicare HMO | Admitting: Physical Therapy

## 2020-12-07 ENCOUNTER — Other Ambulatory Visit: Payer: Self-pay | Admitting: Internal Medicine

## 2020-12-07 DIAGNOSIS — M25561 Pain in right knee: Secondary | ICD-10-CM | POA: Diagnosis not present

## 2020-12-07 DIAGNOSIS — G894 Chronic pain syndrome: Secondary | ICD-10-CM

## 2020-12-07 DIAGNOSIS — R2689 Other abnormalities of gait and mobility: Secondary | ICD-10-CM | POA: Diagnosis not present

## 2020-12-07 DIAGNOSIS — R2681 Unsteadiness on feet: Secondary | ICD-10-CM

## 2020-12-07 DIAGNOSIS — M6281 Muscle weakness (generalized): Secondary | ICD-10-CM | POA: Diagnosis not present

## 2020-12-07 DIAGNOSIS — G8929 Other chronic pain: Secondary | ICD-10-CM

## 2020-12-07 DIAGNOSIS — R293 Abnormal posture: Secondary | ICD-10-CM | POA: Diagnosis not present

## 2020-12-07 DIAGNOSIS — M25652 Stiffness of left hip, not elsewhere classified: Secondary | ICD-10-CM | POA: Diagnosis not present

## 2020-12-07 DIAGNOSIS — R296 Repeated falls: Secondary | ICD-10-CM | POA: Diagnosis not present

## 2020-12-08 ENCOUNTER — Other Ambulatory Visit: Payer: Self-pay | Admitting: Student

## 2020-12-08 ENCOUNTER — Ambulatory Visit: Payer: Medicare HMO | Admitting: Behavioral Health

## 2020-12-08 ENCOUNTER — Encounter: Payer: Self-pay | Admitting: Physical Therapy

## 2020-12-08 DIAGNOSIS — F331 Major depressive disorder, recurrent, moderate: Secondary | ICD-10-CM

## 2020-12-08 DIAGNOSIS — F419 Anxiety disorder, unspecified: Secondary | ICD-10-CM

## 2020-12-08 NOTE — Therapy (Signed)
[facility] [address] [place], [place], [address] Phone: [phone]   Fax:  [phone]  Physical Therapy Treatment  Patient Details  Name: [name] [name] MRN: [id number] Date of Birth: [birth year] Referring Provider (PT): Meridee Score, MD   Encounter Date: 12/07/2020   PT End of Session - 12/08/20 1944     Visit Number 15    Number of Visits 28    Date for PT Re-Evaluation 01/27/21    Authorization Type [employer] [place] and Medicaid (6 visits approved 6/20 - 11/27/20); have requested 16 more visits (2x/week x 8 more weeks)    Progress Note Due on Visit 20    PT Start Time 1425    PT Stop Time 1500    PT Time Calculation (min) 35 min    Equipment Utilized During Treatment Other (comment)   water walker   Activity Tolerance Patient tolerated treatment well    Behavior During Therapy [facility] for tasks assessed/performed             Past Medical History:  Diagnosis Date   Abdominal muscle pain 09/08/2016   Abnormal Pap smear of cervix 2009   Abscess    history of multiple abscesses   Acute bilateral low back pain 02/13/2017   Acute blood loss anemia    Acute on chronic renal failure ([place]) 07/12/2012   Acute renal failure ([place]) 07/12/2012   Adjustment disorder with depressed mood 03/26/2017   AKI (acute kidney injury) ([place])    Anemia of chronic disease 2002   Anxiety    Panic attacks   Bilateral lower extremity edema 05/13/2016   Bipolar disorder ([place])    Cataract    B/L cataract   Cellulitis 05/21/2014   right eye   Chronic bronchitis ([place])    "get it q yr" (05/13/2013)   Chronic pain    Dehiscence of amputation stump ([place])    Depression    Edema of lower extremity    Endocarditis 2002   subacute bacterial endocarditis.    Family history of anesthesia complication    "my mom has a hard time coming out from under"   Fibromyalgia    GERD (gastroesophageal reflux disease)    occ   Heart murmur    Herpes  simplex type 1 infection 01/16/2018   History of blood transfusion    "just low blood count" (05/13/2013)   Hyperlipidemia    Hypertension    Hypothyroidism    Hypothyroidism, adult 03/21/2014   Hypoxia    Leukocytosis    Necrosis (HCC)    and ulceration   Necrotizing fasciitis s/p OR debridements 07/06/2012   Obesity    OSA on CPAP    does not wear all the time   Peripheral neuropathy    Pneumonia    Type II diabetes mellitus ([place])    Type  II   Wound dehiscence 09/04/2019    Past Surgical History:  Procedure Laterality Date   ABDOMINAL AORTOGRAM W/LOWER EXTREMITY Left 03/19/2019   Procedure: ABDOMINAL AORTOGRAM W/LOWER EXTREMITY;  Surgeon: Serafina Mitchell, MD;  Location: [facility];  Service: Cardiovascular;  Laterality: Left;   ABOVE KNEE LEG AMPUTATION Left 10/11/2019   wound dehisence    AIR/FLUID EXCHANGE Right 12/31/2019   Procedure: AIR/FLUID EXCHANGE;  Surgeon: Jalene Mullet, MD;  Location: [facility];  Service: Ophthalmology;  Laterality: Right;   AMPUTATION Left 04/07/2017   Procedure: LEFT BELOW KNEE AMPUTATION;  Surgeon: Newt Minion, MD;  Location:  [facility];  Service: Orthopedics;  Laterality: Left;   AMPUTATION Left 10/11/2019   Procedure: LEFT ABOVE KNEE AMPUTATION;  Surgeon: Newt Minion, MD;  Location: [facility];  Service: Orthopedics;  Laterality: Left;   CATARACT EXTRACTION W/PHACO Right 02/11/2020   Procedure: CATARACT EXTRACTION PHACO AND INTRAOCULAR LENS PLACEMENT (IOC);  Surgeon: Jalene Mullet, MD;  Location: [facility];  Service: Ophthalmology;  Laterality: Right;   CORONARY ARTERY BYPASS GRAFT     EYE SURGERY     lazer   GAS INSERTION Right 12/31/2019   Procedure: INSERTION OF GAS;  Surgeon: Jalene Mullet, MD;  Location: [facility];  Service: Ophthalmology;  Laterality: Right;   INCISION AND DRAINAGE ABSCESS     multiple I&Ds   INCISION AND DRAINAGE ABSCESS Left 07/09/2012   Procedure: DRESSING CHANGE, THIGH WOUND;  Surgeon: Harl Bowie, MD;   Location: [facility];  Service: General;  Laterality: Left;   INCISION AND DRAINAGE OF WOUND Left 07/07/2012   Procedure: IRRIGATION AND DEBRIDEMENT WOUND;  Surgeon: Harl Bowie, MD;  Location: [facility];  Service: General;  Laterality: Left;   INCISION AND DRAINAGE PERIRECTAL ABSCESS Left 07/14/2012   Procedure: DEBRIDEMENT OF SKIN & SOFT TISSUE; DRESSING CHANGE UNDER ANESTHESIA;  Surgeon: Gayland Curry, MD,FACS;  Location: [facility];  Service: General;  Laterality: Left;   INCISION AND DRAINAGE PERIRECTAL ABSCESS Left 07/16/2012   Procedure: I&D Left Thigh;  Surgeon: Gwenyth Ober, MD;  Location: [facility];  Service: General;  Laterality: Left;   INCISION AND DRAINAGE PERIRECTAL ABSCESS N/A 01/05/2015   Procedure: IRRIGATION AND DEBRIDEMENT PERIRECTAL ABSCESS;  Surgeon: Donnie Mesa, MD;  Location: [facility];  Service: General;  Laterality: N/A;   IRRIGATION AND DEBRIDEMENT ABSCESS Left 07/06/2012   Procedure: IRRIGATION AND DEBRIDEMENT ABSCESS BUTTOCKS AND THIGH;  Surgeon: Shann Medal, MD;  Location: [facility];  Service: General;  Laterality: Left;   IRRIGATION AND DEBRIDEMENT ABSCESS Left 08/10/2012   Procedure: IRRIGATION AND DEBRIDEMENT ABSCESS;  Surgeon: Madilyn Hook, DO;  Location: [facility];  Service: General;  Laterality: Left;   PARS PLANA VITRECTOMY Right 12/31/2019   Procedure: PARS PLANA VITRECTOMY WITH 25 GAUGE;  Surgeon: Jalene Mullet, MD;  Location: [facility];  Service: Ophthalmology;  Laterality: Right;   PHOTOCOAGULATION WITH LASER Right 12/31/2019   Procedure: PHOTOCOAGULATION WITH LASER;  Surgeon: Jalene Mullet, MD;  Location: [facility];  Service: Ophthalmology;  Laterality: Right;   REPAIR OF COMPLEX TRACTION RETINAL DETACHMENT Right 12/31/2019   Procedure: REPAIR OF COMPLEX TRACTION RETINAL;  Surgeon: Jalene Mullet, MD;  Location: [facility];  Service: Ophthalmology;  Laterality: Right;   STUMP REVISION Left 06/09/2017   Procedure: REVISION LEFT BELOW KNEE AMPUTATION;  Surgeon: Newt Minion, MD;  Location:  [facility];  Service: Orthopedics;  Laterality: Left;   STUMP REVISION Left 09/04/2019   Procedure: LEFT BELOW KNEE AMPUTATION REVISION;  Surgeon: Newt Minion, MD;  Location: [facility];  Service: Orthopedics;  Laterality: Left;   STUMP REVISION Left 09/20/2019   Procedure: LEFT BELOW KNEE AMPUTATION REVISION;  Surgeon: Newt Minion, MD;  Location: [facility];  Service: Orthopedics;  Laterality: Left;   TONSILLECTOMY  1994    There were no vitals filed for this visit.   Subjective Assessment - 12/08/20 1938     Subjective Pt presents for aquatic PT- arrives 10" late - states she thought she could walk from front door of [facility] back to pool area but states she was unable to make it - pt was  in facility's transport wheelchair, propelled by [employer]; pt was wearing prosthesis, which she states she has been trying to increase wearing time    Limitations Walking;House hold activities;Standing    How long can you stand comfortably? 5 mins    Patient Stated Goals "I want to be able to walk on this leg and move around"    Currently in Pain? Yes    Pain Score 5     Pain Location Knee    Pain Orientation Right    Pain Descriptors / Indicators Aching    Pain Type Chronic pain    Pain Onset More than a month ago    Pain Frequency Constant                 Aquatic therapy at [facility] - pt arrived 10" late due to being unable to amb. Front door of building back to pool area - needed wheelchair     Patient seen for aquatic therapy today.  Treatment took place in water 3.6-4.8 feet deep depending upon activity.  Pt entered and exited the   the pool via hydraulic chair lift; pt transferred from wheelchair to chair lift with SBA and chair lift to w/c with CGA for safety.  Pt gait trained in 4.5' water depth with use of water walker ; pt gait trained 18' x 6 reps across pool with min assist to stabilize walker  Pt performed RLE strengthening exercises - holding onto side of pool for  flotation - Rt knee extension with use of aquatic cuff for increased resistance 20 reps  Rt hip abduction/adduction 2 sets 10 reps; Rt hip extension 10 reps   Pt in supine position - used noodle under arms; bicycling LE's approx. 30' x 4 reps (length of pool); hip abduction/adduction performed 30' x 4 reps   Pt requires buoyancy of water for joint off loading for reduced pain with weight bearing on RLE - pt reported decreased pain in water than that experienced with weight bearing on land.  Buoyancy resisted exercises beneficial for Lt hip flexor stretching and strengthening.  Buoyancy needed for support and for safety for reduced fall risk with activities in standing and with gait training.  Viscosity of water needed for resistance for strengthening of trunk musc. for improved core stabilization and for LE strengthening.                                   PT Short Term Goals - 12/08/20 1951       PT SHORT TERM GOAL #1   Title Pt will increase prosthetic wear time to 100% of awake hours but removing intermittently to clean sweat and inspect skin    Baseline 90% of awake hours    Time 4    Period Weeks    Status New    Target Date 12/28/20      PT SHORT TERM GOAL #2   Title Pt will demonstrate independence with updated HEP for balance, LE strengthening    Baseline HEP has not been updated yet    Time 4    Period Weeks    Status New    Target Date 12/28/20      PT SHORT TERM GOAL #3   Title Pt will demonstrate MOD I sit <> stand and stand pivot transfers with cane    Baseline min A with cane    Time 4  Period Weeks    Status New    Target Date 12/28/20      PT SHORT TERM GOAL #4   Title Pt will ambulate x 230' with cane over indoor surfaces with supervision.    Baseline Min A x 115' with cane    Time 4    Period Weeks    Status New    Target Date 12/28/20      PT SHORT TERM GOAL #5   Title Pt will perform gait velocity assessment with cane     Baseline has been using RW    Time 4    Period Weeks    Status New    Target Date 12/28/20               PT Long Term Goals - 12/08/20 1951       PT LONG TERM GOAL #1   Title Pt will be independent with final HEP in order to indicate improved functional mobility and balance with use of prosthesis.    Baseline reports completing 2-3/week, no consistent; will benefit from progressive HEP    Time 8    Period Weeks    Status Revised      PT LONG TERM GOAL #2   Title Pt will negotiate 4 stairs with cane and one rail with supervision step to sequence; will begin to learn how to ride her hydraulic knee in order to descend alternating sequence    Baseline Min A with cane and rail, step to sequence    Time 8    Period Weeks    Status New      PT LONG TERM GOAL #3   Title Pt will negotiate ramp and curb with prosthesis and cane with min A    Baseline has not performed with cane    Time 8    Period Weeks    Status New      PT LONG TERM GOAL #4   Title Pt will improve gait speed to >/= 1.8 ft/sec with Cane and prosthesis to indicate dec fall risk.    Baseline has not been assessed with cane    Time 8    Period Weeks    Status New      PT LONG TERM GOAL #5   Title Pt will ambulate x 100' outside over level paved surfaces with Kasandra Knudsen and supervision    Baseline 115' indoors with [facility] with prosthesis with min A due to significant pain in R knee    Time 8    Period Weeks    Status New                   Plan - 12/08/20 1946     Clinical Impression Statement Aquatic therapy session focused on RLE strengthening and gait training in 4.5' water depth for joint offloading for decreased Rt knee pain.  Pt able to use water walker and amb in water with RLE and bil. UE support.  Pt needed deeper water for increased buoyancy for joint offloading for reduced pain.  Pt reported continued pain in Rt knee but stated it was not as painful in the pool as it is on land.  Cont with POC.     Personal Factors and Comorbidities Comorbidity 3+;Past/Current Experience;Time since onset of injury/illness/exacerbation    Comorbidities see above    Examination-Activity Limitations Bend;Carry;Dressing;Stand;Stairs;Squat;Locomotion Level;Transfers    Examination-Participation Restrictions Community Activity;Meal Prep;Shop;Laundry    Rehab Potential Good    PT Frequency  2x / week    PT Duration 8 weeks    PT Treatment/Interventions ADLs/Self Care Home Management;DME Instruction;Gait training;Stair training;Functional mobility training;Therapeutic activities;Therapeutic exercise;Balance training;Neuromuscular re-education;Patient/family education;Prosthetic Training;Compression bandaging;Passive range of motion;Energy conservation;Iontophoresis 4mg /ml Dexamethasone;Cryotherapy;Aquatic Therapy;Manual techniques    PT Next Visit Plan R knee pain is limiting gait with cane - continue R knee stabilizing exercises in NWB. Continue gait with SBQC, stairs with SBQC. Update HEP as needed based on what she is doing at home already. Work on her activating knee/feeling comfortable being on prosthesis, balance, endurance.    PT Home Exercise Plan Access Code: [email or web address]    Consulted and Agree with Plan of Care Patient             Patient will benefit from skilled therapeutic intervention in order to improve the following deficits and impairments:  Abnormal gait, Decreased activity tolerance, Decreased balance, Decreased endurance, Decreased knowledge of precautions, Decreased knowledge of use of DME, Decreased mobility, Decreased range of motion, Decreased strength, Difficulty walking, Increased edema, Impaired perceived functional ability, Impaired flexibility, Impaired sensation, Postural dysfunction, Improper body mechanics, Prosthetic Dependency, Pain  Visit Diagnosis: Unsteadiness on feet  Other abnormalities of gait and mobility  Chronic pain of right knee     Problem List Patient Active  Problem List   Diagnosis Date Noted   Cough 05/26/2020   Chronic pain syndrome 01/16/2020   Hx of AKA (above knee amputation), left (HCC)    Symptomatic anemia 10/21/2019   Anemia 10/21/2019   Acute respiratory failure with hypoxia ([place]) 10/21/2019   Blurry vision, right eye 10/08/2019   Tobacco use 03/01/2019   Phantom limb pain ([place]) 11/08/2018   Current moderate episode of major depressive disorder ([place])    Hyperkalemia    Anemia of chronic disease    Severe protein-calorie malnutrition ([place])    Fall    Status post below knee amputation, left ([place]) 04/11/2017   Bipolar affective disorder ([place])    Heart failure with preserved ejection fraction ([place]) 02/09/2017   Routine adult health maintenance 12/08/2016   Chronic pain of right knee 09/08/2016   Chronic kidney disease (CKD), stage III (moderate) ([place]) 07/15/2016   OSA (obstructive sleep apnea) 05/13/2016   Vaginal irritation 03/14/2016   Morbid obesity due to excess calories ([place]) 11/27/2015   Subclinical hypothyroidism 04/25/2013   Controlled type 2 diabetes mellitus ([place]) 07/12/2012   Carpal tunnel syndrome, bilateral 01/25/2012   Diabetic polyneuropathy associated with type 2 diabetes mellitus ([place]) 07/04/2011   Anxiety and depression 06/02/2011   GERD 12/06/2007   Hyperlipidemia 08/29/2007   Hypertension associated with diabetes ([place]) 08/29/2007    Marcedes Tech, [place], PT, [facility] 12/08/2020, 7:56 PM  [facility] [address] [place], [place], [address] Phone: [phone]   Fax:  [phone]  Name: LORANA MAFFEO MRN: [id number] Date of Birth: August 17, 1975
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
## 2020-12-09 ENCOUNTER — Other Ambulatory Visit: Payer: Self-pay

## 2020-12-09 ENCOUNTER — Ambulatory Visit: Payer: Medicare HMO | Admitting: Physical Therapy

## 2020-12-09 VITALS — BP 206/115 | HR 87

## 2020-12-09 DIAGNOSIS — M6281 Muscle weakness (generalized): Secondary | ICD-10-CM

## 2020-12-09 DIAGNOSIS — R296 Repeated falls: Secondary | ICD-10-CM

## 2020-12-09 DIAGNOSIS — M25652 Stiffness of left hip, not elsewhere classified: Secondary | ICD-10-CM

## 2020-12-09 DIAGNOSIS — R293 Abnormal posture: Secondary | ICD-10-CM

## 2020-12-09 DIAGNOSIS — R2681 Unsteadiness on feet: Secondary | ICD-10-CM

## 2020-12-09 DIAGNOSIS — R2689 Other abnormalities of gait and mobility: Secondary | ICD-10-CM

## 2020-12-09 DIAGNOSIS — G8929 Other chronic pain: Secondary | ICD-10-CM

## 2020-12-09 NOTE — Therapy (Signed)
[facility] [address] [place], [place], [address] Phone: [phone]   Fax:  [phone]  Physical Therapy Treatment -- No charge  Patient Details  Name: [name] [name] MRN: [id number] Date of Birth: 04/18/1976 Referring Provider (PT): Meridee Score, MD   Encounter Date: 12/09/2020   PT End of Session - 12/09/20 1234     Visit Number 15    Number of Visits 28    Date for PT Re-Evaluation 01/27/21    Authorization Type [employer] [place] and Medicaid (6 visits approved 6/20 - 11/27/20); have requested 16 more visits (2x/week x 8 more weeks)    Progress Note Due on Visit 20    Equipment Utilized During Treatment Other (comment)   water walker   Activity Tolerance Patient tolerated treatment well    Behavior During Therapy [facility] for tasks assessed/performed             Past Medical History:  Diagnosis Date   Abdominal muscle pain 09/08/2016   Abnormal Pap smear of cervix 2009   Abscess    history of multiple abscesses   Acute bilateral low back pain 02/13/2017   Acute blood loss anemia    Acute on chronic renal failure ([place]) 07/12/2012   Acute renal failure ([place]) 07/12/2012   Adjustment disorder with depressed mood 03/26/2017   AKI (acute kidney injury) ([place])    Anemia of chronic disease 2002   Anxiety    Panic attacks   Bilateral lower extremity edema 05/13/2016   Bipolar disorder ([place])    Cataract    B/L cataract   Cellulitis 05/21/2014   right eye   Chronic bronchitis ([place])    "get it q yr" (05/13/2013)   Chronic pain    Dehiscence of amputation stump ([place])    Depression    Edema of lower extremity    Endocarditis 2002   subacute bacterial endocarditis.    Family history of anesthesia complication    "my mom has a hard time coming out from under"   Fibromyalgia    GERD (gastroesophageal reflux disease)    occ   Heart murmur    Herpes simplex type 1 infection 01/16/2018   History of blood transfusion     "just low blood count" (05/13/2013)   Hyperlipidemia    Hypertension    Hypothyroidism    Hypothyroidism, adult 03/21/2014   Hypoxia    Leukocytosis    Necrosis (HCC)    and ulceration   Necrotizing fasciitis s/p OR debridements 07/06/2012   Obesity    OSA on CPAP    does not wear all the time   Peripheral neuropathy    Pneumonia    Type II diabetes mellitus ([place])    Type  II   Wound dehiscence 09/04/2019    Past Surgical History:  Procedure Laterality Date   ABDOMINAL AORTOGRAM W/LOWER EXTREMITY Left 03/19/2019   Procedure: ABDOMINAL AORTOGRAM W/LOWER EXTREMITY;  Surgeon: Serafina Mitchell, MD;  Location: [facility];  Service: Cardiovascular;  Laterality: Left;   ABOVE KNEE LEG AMPUTATION Left 10/11/2019   wound dehisence    AIR/FLUID EXCHANGE Right 12/31/2019   Procedure: AIR/FLUID EXCHANGE;  Surgeon: Jalene Mullet, MD;  Location: [facility];  Service: Ophthalmology;  Laterality: Right;   AMPUTATION Left 04/07/2017   Procedure: LEFT BELOW KNEE AMPUTATION;  Surgeon: Newt Minion, MD;  Location: [facility];  Service: Orthopedics;  Laterality: Left;   AMPUTATION Left 10/11/2019   Procedure: LEFT ABOVE KNEE AMPUTATION;  Surgeon: Newt Minion, MD;  Location: [facility];  Service: Orthopedics;  Laterality: Left;   CATARACT EXTRACTION W/PHACO Right 02/11/2020   Procedure: CATARACT EXTRACTION PHACO AND INTRAOCULAR LENS PLACEMENT (IOC);  Surgeon: Jalene Mullet, MD;  Location: [facility];  Service: Ophthalmology;  Laterality: Right;   CORONARY ARTERY BYPASS GRAFT     EYE SURGERY     lazer   GAS INSERTION Right 12/31/2019   Procedure: INSERTION OF GAS;  Surgeon: Jalene Mullet, MD;  Location: [facility];  Service: Ophthalmology;  Laterality: Right;   INCISION AND DRAINAGE ABSCESS     multiple I&Ds   INCISION AND DRAINAGE ABSCESS Left 07/09/2012   Procedure: DRESSING CHANGE, THIGH WOUND;  Surgeon: Harl Bowie, MD;  Location: [facility];  Service: General;  Laterality: Left;   INCISION AND  DRAINAGE OF WOUND Left 07/07/2012   Procedure: IRRIGATION AND DEBRIDEMENT WOUND;  Surgeon: Harl Bowie, MD;  Location: [facility];  Service: General;  Laterality: Left;   INCISION AND DRAINAGE PERIRECTAL ABSCESS Left 07/14/2012   Procedure: DEBRIDEMENT OF SKIN & SOFT TISSUE; DRESSING CHANGE UNDER ANESTHESIA;  Surgeon: Gayland Curry, MD,FACS;  Location: [facility];  Service: General;  Laterality: Left;   INCISION AND DRAINAGE PERIRECTAL ABSCESS Left 07/16/2012   Procedure: I&D Left Thigh;  Surgeon: Gwenyth Ober, MD;  Location: [facility];  Service: General;  Laterality: Left;   INCISION AND DRAINAGE PERIRECTAL ABSCESS N/A 01/05/2015   Procedure: IRRIGATION AND DEBRIDEMENT PERIRECTAL ABSCESS;  Surgeon: Donnie Mesa, MD;  Location: [facility];  Service: General;  Laterality: N/A;   IRRIGATION AND DEBRIDEMENT ABSCESS Left 07/06/2012   Procedure: IRRIGATION AND DEBRIDEMENT ABSCESS BUTTOCKS AND THIGH;  Surgeon: Shann Medal, MD;  Location: [facility];  Service: General;  Laterality: Left;   IRRIGATION AND DEBRIDEMENT ABSCESS Left 08/10/2012   Procedure: IRRIGATION AND DEBRIDEMENT ABSCESS;  Surgeon: Madilyn Hook, DO;  Location: [facility];  Service: General;  Laterality: Left;   PARS PLANA VITRECTOMY Right 12/31/2019   Procedure: PARS PLANA VITRECTOMY WITH 25 GAUGE;  Surgeon: Jalene Mullet, MD;  Location: [facility];  Service: Ophthalmology;  Laterality: Right;   PHOTOCOAGULATION WITH LASER Right 12/31/2019   Procedure: PHOTOCOAGULATION WITH LASER;  Surgeon: Jalene Mullet, MD;  Location: [facility];  Service: Ophthalmology;  Laterality: Right;   REPAIR OF COMPLEX TRACTION RETINAL DETACHMENT Right 12/31/2019   Procedure: REPAIR OF COMPLEX TRACTION RETINAL;  Surgeon: Jalene Mullet, MD;  Location: [facility];  Service: Ophthalmology;  Laterality: Right;   STUMP REVISION Left 06/09/2017   Procedure: REVISION LEFT BELOW KNEE AMPUTATION;  Surgeon: Newt Minion, MD;  Location: [facility];  Service: Orthopedics;  Laterality: Left;   STUMP REVISION  Left 09/04/2019   Procedure: LEFT BELOW KNEE AMPUTATION REVISION;  Surgeon: Newt Minion, MD;  Location: [facility];  Service: Orthopedics;  Laterality: Left;   STUMP REVISION Left 09/20/2019   Procedure: LEFT BELOW KNEE AMPUTATION REVISION;  Surgeon: Newt Minion, MD;  Location: [facility];  Service: Orthopedics;  Laterality: Left;   TONSILLECTOMY  1994    Vitals:   12/09/20 1200  BP: (!) 206/115  Pulse: 87     Subjective Assessment - 12/09/20 1155     Subjective Pt states she feels her heart failure is acting up. She notes her BP has been high. Pt states she enjoyed the pool and would like to try and walk back there again. Pt notes she did not take her BP meds this morning.    Limitations Walking;House hold  activities;Standing    How long can you stand comfortably? 5 mins    How long can you walk comfortably? 5 mins    Patient Stated Goals "I want to be able to walk on this leg and move around"    Currently in Pain? Yes    Pain Score 5     Pain Location Knee    Pain Orientation Right    Pain Descriptors / Indicators Aching    Pain Onset More than a month ago                                       PT Education - 12/09/20 1229     Education Details Discussed dangers of elevated BP and her increased risk for stroke and/or heart attack. Recommended for pt to seek medical attention -- ED, urgent care or PCP.    Person(s) Educated Patient    Methods Explanation    Comprehension Verbalized understanding              PT Short Term Goals - 12/08/20 1951       PT SHORT TERM GOAL #1   Title Pt will increase prosthetic wear time to 100% of awake hours but removing intermittently to clean sweat and inspect skin    Baseline 90% of awake hours    Time 4    Period Weeks    Status New    Target Date 12/28/20      PT SHORT TERM GOAL #2   Title Pt will demonstrate independence with updated HEP for balance, LE strengthening    Baseline HEP has not been updated  yet    Time 4    Period Weeks    Status New    Target Date 12/28/20      PT SHORT TERM GOAL #3   Title Pt will demonstrate MOD I sit <> stand and stand pivot transfers with cane    Baseline min A with cane    Time 4    Period Weeks    Status New    Target Date 12/28/20      PT SHORT TERM GOAL #4   Title Pt will ambulate x 230' with cane over indoor surfaces with supervision.    Baseline Min A x 115' with cane    Time 4    Period Weeks    Status New    Target Date 12/28/20      PT SHORT TERM GOAL #5   Title Pt will perform gait velocity assessment with cane    Baseline has been using RW    Time 4    Period Weeks    Status New    Target Date 12/28/20               PT Long Term Goals - 12/08/20 1951       PT LONG TERM GOAL #1   Title Pt will be independent with final HEP in order to indicate improved functional mobility and balance with use of prosthesis.    Baseline reports completing 2-3/week, no consistent; will benefit from progressive HEP    Time 8    Period Weeks    Status Revised      PT LONG TERM GOAL #2   Title Pt will negotiate 4 stairs with cane and one rail with supervision step to sequence; will begin to learn how to ride her  hydraulic knee in order to descend alternating sequence    Baseline Min A with cane and rail, step to sequence    Time 8    Period Weeks    Status New      PT LONG TERM GOAL #3   Title Pt will negotiate ramp and curb with prosthesis and cane with min A    Baseline has not performed with cane    Time 8    Period Weeks    Status New      PT LONG TERM GOAL #4   Title Pt will improve gait speed to >/= 1.8 ft/sec with Cane and prosthesis to indicate dec fall risk.    Baseline has not been assessed with cane    Time 8    Period Weeks    Status New      PT LONG TERM GOAL #5   Title Pt will ambulate x 100' outside over level paved surfaces with Kasandra Knudsen and supervision    Baseline 115' indoors with [facility] with prosthesis with min  A due to significant pain in R knee    Time 8    Period Weeks    Status New                   Plan - 12/09/20 1230     Clinical Impression Statement Held treatment session today. Pt's BP too elevated. Discussed calling 911, going to ED or urgent care and calling her PCP -- pt refused at this time stating she would like to go home and take her BP medications first to see if BP will lower enough. Pt states family can help her check her BP tonight and tomorrow. Advised pt to seek medical attention if it continues to remain elevated. Pt verbalizes her understanding for increased risk of stroke or heart attack.    Personal Factors and Comorbidities Comorbidity 3+;Past/Current Experience;Time since onset of injury/illness/exacerbation    Comorbidities see above    Examination-Activity Limitations Bend;Carry;Dressing;Stand;Stairs;Squat;Locomotion Level;Transfers    Examination-Participation Restrictions Community Activity;Meal Prep;Shop;Laundry    Rehab Potential Good    PT Frequency 2x / week    PT Duration 8 weeks    PT Treatment/Interventions ADLs/Self Care Home Management;DME Instruction;Gait training;Stair training;Functional mobility training;Therapeutic activities;Therapeutic exercise;Balance training;Neuromuscular re-education;Patient/family education;Prosthetic Training;Compression bandaging;Passive range of motion;Energy conservation;Iontophoresis 4mg /ml Dexamethasone;Cryotherapy;Aquatic Therapy;Manual techniques    PT Next Visit Plan Check BP! Please schedule more appointments -- POC ends in September and will need to request [employer] auth after 7/28. R knee pain is limiting gait with cane - continue R knee stabilizing exercises in NWB. Continue gait with SBQC, stairs with SBQC. Update HEP as needed based on what she is doing at home already. Work on her activating knee/feeling comfortable being on prosthesis, balance, endurance.    PT Home Exercise Plan Access Code: [email or web address]     Consulted and Agree with Plan of Care Patient             Patient will benefit from skilled therapeutic intervention in order to improve the following deficits and impairments:  Abnormal gait, Decreased activity tolerance, Decreased balance, Decreased endurance, Decreased knowledge of precautions, Decreased knowledge of use of DME, Decreased mobility, Decreased range of motion, Decreased strength, Difficulty walking, Increased edema, Impaired perceived functional ability, Impaired flexibility, Impaired sensation, Postural dysfunction, Improper body mechanics, Prosthetic Dependency, Pain  Visit Diagnosis: Unsteadiness on feet  Other abnormalities of gait and mobility  Chronic pain of right knee  Abnormal posture  Repeated falls  Muscle weakness (generalized)  Hip stiffness, left     Problem List Patient Active Problem List   Diagnosis Date Noted   Cough 05/26/2020   Chronic pain syndrome 01/16/2020   Hx of AKA (above knee amputation), left (HCC)    Symptomatic anemia 10/21/2019   Anemia 10/21/2019   Acute respiratory failure with hypoxia ([place]) 10/21/2019   Blurry vision, right eye 10/08/2019   Tobacco use 03/01/2019   Phantom limb pain ([place]) 11/08/2018   Current moderate episode of major depressive disorder ([place])    Hyperkalemia    Anemia of chronic disease    Severe protein-calorie malnutrition ([place])    Fall    Status post below knee amputation, left ([place]) 04/11/2017   Bipolar affective disorder ([place])    Heart failure with preserved ejection fraction ([place]) 02/09/2017   Routine adult health maintenance 12/08/2016   Chronic pain of right knee 09/08/2016   Chronic kidney disease (CKD), stage III (moderate) ([place]) 07/15/2016   OSA (obstructive sleep apnea) 05/13/2016   Vaginal irritation 03/14/2016   Morbid obesity due to excess calories ([place]) 11/27/2015   Subclinical hypothyroidism 04/25/2013   Controlled type 2 diabetes mellitus ([place]) 07/12/2012   Carpal tunnel  syndrome, bilateral 01/25/2012   Diabetic polyneuropathy associated with type 2 diabetes mellitus ([place]) 07/04/2011   Anxiety and depression 06/02/2011   GERD 12/06/2007   Hyperlipidemia 08/29/2007   Hypertension associated with diabetes [facility]) 08/29/2007    Lin Hackmann April Gordy Levan PT, DPT 12/09/2020, 12:35 PM  [facility] [address] [place], [place], [address] Phone: [phone]   Fax:  [phone]  Name: VASILISA VORE MRN: [id number] Date of Birth: [birth year]

## 2020-12-10 ENCOUNTER — Encounter: Payer: Self-pay | Admitting: Student

## 2020-12-10 NOTE — Telephone Encounter (Signed)
Refill refused.  Patient will need to be scheduled for a appointment or telehealth appointment with me to discuss further.  This medication was used as needed and in the past she has not needed monthly refills.  There is some concern with interactions with her other medications if she is using this more frequently.

## 2020-12-11 NOTE — Telephone Encounter (Signed)
[place] office - can you schedule pt an appt to discuss medication request per Dr Johnney Ou.  Thanks

## 2020-12-11 NOTE — Telephone Encounter (Signed)
Just spoke with patient.  She agreed to a telehealth appointment on 12/15/2020 at 3:15 pm with Dr. Johnney Ou.

## 2020-12-11 NOTE — Telephone Encounter (Signed)
Perfect, thank you

## 2020-12-14 ENCOUNTER — Ambulatory Visit: Payer: Medicare HMO

## 2020-12-14 ENCOUNTER — Other Ambulatory Visit: Payer: Self-pay

## 2020-12-14 DIAGNOSIS — Z89512 Acquired absence of left leg below knee: Secondary | ICD-10-CM

## 2020-12-14 MED ORDER — BUMETANIDE 2 MG PO TABS: 2.0000 mg | ORAL_TABLET | Freq: Two times a day (BID) | ORAL | Status: DC

## 2020-12-14 MED ORDER — DICLOFENAC SODIUM 1 % EX GEL: 1.0000 "application " | Freq: Four times a day (QID) | CUTANEOUS | 2 refills | Status: DC | PRN

## 2020-12-14 NOTE — Addendum Note (Signed)
Addended by: Riesa Pope on: 12/14/2020 05:37 PM   Modules accepted: Orders

## 2020-12-15 ENCOUNTER — Ambulatory Visit (INDEPENDENT_AMBULATORY_CARE_PROVIDER_SITE_OTHER): Payer: Medicare HMO | Admitting: Student

## 2020-12-15 ENCOUNTER — Other Ambulatory Visit: Payer: Self-pay

## 2020-12-15 DIAGNOSIS — E1122 Type 2 diabetes mellitus with diabetic chronic kidney disease: Secondary | ICD-10-CM

## 2020-12-15 DIAGNOSIS — I129 Hypertensive chronic kidney disease with stage 1 through stage 4 chronic kidney disease, or unspecified chronic kidney disease: Secondary | ICD-10-CM

## 2020-12-15 DIAGNOSIS — G894 Chronic pain syndrome: Secondary | ICD-10-CM | POA: Diagnosis not present

## 2020-12-15 DIAGNOSIS — E878 Other disorders of electrolyte and fluid balance, not elsewhere classified: Secondary | ICD-10-CM

## 2020-12-15 DIAGNOSIS — N189 Chronic kidney disease, unspecified: Secondary | ICD-10-CM | POA: Diagnosis not present

## 2020-12-15 DIAGNOSIS — M62838 Other muscle spasm: Secondary | ICD-10-CM | POA: Diagnosis not present

## 2020-12-15 MED ORDER — CYCLOBENZAPRINE HCL 5 MG PO TABS: ORAL_TABLET | ORAL | 0 refills | Status: DC

## 2020-12-15 NOTE — Progress Notes (Signed)
   CC: Muscle spasms  This is a telephone encounter between [name] [name] and [name] [name] on 12/15/2020 for muscle spasms. The visit was conducted with the patient located at home and [name] [name] at [facility]. The patient's identity was confirmed using their DOB and current address. The patient has consented to being evaluated through a telephone encounter and understands the associated risks (an examination cannot be done and the patient may need to come in for an appointment) / benefits (allows the patient to remain at home, decreasing exposure to coronavirus). I personally spent 15 minutes on medical discussion.   HPI:  [name] is a 45 y.o. with PMH as below.   Please see A&P for assessment of the patient's acute and chronic medical conditions.   Past Medical History:  Diagnosis Date   Abdominal muscle pain 09/08/2016   Abnormal Pap smear of cervix 2009   Abscess    history of multiple abscesses   Acute bilateral low back pain 02/13/2017   Acute blood loss anemia    Acute on chronic renal failure ([place]) 07/12/2012   Acute renal failure ([place]) 07/12/2012   Adjustment disorder with depressed mood 03/26/2017   AKI (acute kidney injury) ([place])    Anemia 10/21/2019   Anemia of chronic disease 2002   Anxiety    Panic attacks   Bilateral lower extremity edema 05/13/2016   Bipolar disorder ([place])    Cataract    B/L cataract   Cellulitis 05/21/2014   right eye   Chronic bronchitis ([place])    "get it q yr" (05/13/2013)   Chronic pain    Chronic pain of right knee 09/08/2016   Dehiscence of amputation stump ([place])    Depression    Edema of lower extremity    Endocarditis 2002   subacute bacterial endocarditis.    Family history of anesthesia complication    "my mom has a hard time coming out from under"   Fibromyalgia    GERD (gastroesophageal reflux disease)    occ   Heart murmur    Herpes simplex type 1 infection 01/16/2018   History of blood transfusion     "just low blood count" (05/13/2013)   Hyperlipidemia    Hypertension    Hypothyroidism    Hypothyroidism, adult 03/21/2014   Hypoxia    Leukocytosis    Necrosis ([place])    and ulceration   Necrotizing fasciitis s/p OR debridements 07/06/2012   Obesity    OSA on CPAP    does not wear all the time   Peripheral neuropathy    Pneumonia    Severe protein-calorie malnutrition (HCC)    Type II diabetes mellitus ([place])    Type  II   Wound dehiscence 09/04/2019   Review of Systems:   Muscle spasms of left thigh and right leg    Assessment & Plan:   See Encounters Tab for problem based charting.  Patient discussed with Dr.  Newell Coral DO  Internal Medicine Resident PGY-2 [place]  Pager: [phone]

## 2020-12-16 ENCOUNTER — Encounter: Payer: Self-pay | Admitting: Physical Therapy

## 2020-12-16 ENCOUNTER — Ambulatory Visit: Payer: Medicare HMO | Admitting: Physical Therapy

## 2020-12-16 VITALS — BP 185/99 | HR 88

## 2020-12-16 DIAGNOSIS — M25561 Pain in right knee: Secondary | ICD-10-CM | POA: Diagnosis not present

## 2020-12-16 DIAGNOSIS — R2689 Other abnormalities of gait and mobility: Secondary | ICD-10-CM | POA: Diagnosis not present

## 2020-12-16 DIAGNOSIS — M62838 Other muscle spasm: Secondary | ICD-10-CM

## 2020-12-16 DIAGNOSIS — M25652 Stiffness of left hip, not elsewhere classified: Secondary | ICD-10-CM | POA: Diagnosis not present

## 2020-12-16 DIAGNOSIS — M6281 Muscle weakness (generalized): Secondary | ICD-10-CM

## 2020-12-16 DIAGNOSIS — R296 Repeated falls: Secondary | ICD-10-CM | POA: Diagnosis not present

## 2020-12-16 DIAGNOSIS — G8929 Other chronic pain: Secondary | ICD-10-CM

## 2020-12-16 DIAGNOSIS — R2681 Unsteadiness on feet: Secondary | ICD-10-CM

## 2020-12-16 DIAGNOSIS — R293 Abnormal posture: Secondary | ICD-10-CM

## 2020-12-16 HISTORY — DX: Other muscle spasm: M62.838

## 2020-12-16 MED ORDER — BUMETANIDE 2 MG PO TABS: 2.0000 mg | ORAL_TABLET | Freq: Two times a day (BID) | ORAL | 0 refills | Status: DC

## 2020-12-16 NOTE — Addendum Note (Signed)
Addended by: Velora Heckler on: 12/16/2020 11:45 AM   Modules accepted: Orders

## 2020-12-16 NOTE — Therapy (Signed)
[facility] [address] [place], [place], [address] Phone: [phone]   Fax:  [phone]  Physical Therapy Treatment  Patient Details  Name: [name] [name] MRN: [id number] Date of Birth: 05-20-1976 Referring Provider (PT): Meridee Score, MD   Encounter Date: 12/16/2020   PT End of Session - 12/16/20 1027     Visit Number 16    Number of Visits 28    Date for PT Re-Evaluation 01/27/21    Authorization Type [employer] [place] and Medicaid    Authorization Time Period 6 visits approved 6/20 - 11/27/20); 9 visits from 11/30/20- 12/17/20; submitted form to John on 12/16/20 for more visists    Authorization - Visit Number 5    Authorization - Number of Visits 9    Progress Note Due on Visit 20    PT Start Time 1018    PT Stop Time 1100    PT Time Calculation (min) 42 min    Equipment Utilized During Treatment Gait belt    Activity Tolerance Patient tolerated treatment well;Treatment limited secondary to medical complications (Comment)   limited by elevated BP with activity, needing rest breaks to allow recovery.   Behavior During Therapy [facility] for tasks assessed/performed             Past Medical History:  Diagnosis Date   Abdominal muscle pain 09/08/2016   Abnormal Pap smear of cervix 2009   Abscess    history of multiple abscesses   Acute bilateral low back pain 02/13/2017   Acute blood loss anemia    Acute on chronic renal failure ([place]) 07/12/2012   Acute renal failure ([place]) 07/12/2012   Adjustment disorder with depressed mood 03/26/2017   AKI (acute kidney injury) ([place])    Anemia 10/21/2019   Anemia of chronic disease 2002   Anxiety    Panic attacks   Bilateral lower extremity edema 05/13/2016   Bipolar disorder ([place])    Cataract    B/L cataract   Cellulitis 05/21/2014   right eye   Chronic bronchitis ([place])    "get it q yr" (05/13/2013)   Chronic pain    Chronic pain of right knee 09/08/2016   Dehiscence of  amputation stump ([place])    Depression    Edema of lower extremity    Endocarditis 2002   subacute bacterial endocarditis.    Family history of anesthesia complication    "my mom has a hard time coming out from under"   Fibromyalgia    GERD (gastroesophageal reflux disease)    occ   Heart murmur    Herpes simplex type 1 infection 01/16/2018   History of blood transfusion    "just low blood count" (05/13/2013)   Hyperlipidemia    Hypertension    Hypothyroidism    Hypothyroidism, adult 03/21/2014   Hypoxia    Leukocytosis    Necrosis (HCC)    and ulceration   Necrotizing fasciitis s/p OR debridements 07/06/2012   Obesity    OSA on CPAP    does not wear all the time   Peripheral neuropathy    Pneumonia    Severe protein-calorie malnutrition (HCC)    Type II diabetes mellitus ([place])    Type  II   Wound dehiscence 09/04/2019    Past Surgical History:  Procedure Laterality Date   ABDOMINAL AORTOGRAM W/LOWER EXTREMITY Left 03/19/2019   Procedure: ABDOMINAL AORTOGRAM W/LOWER EXTREMITY;  Surgeon: Serafina Mitchell, MD;  Location: [facility];  Service: Cardiovascular;  Laterality: Left;   ABOVE KNEE LEG AMPUTATION Left 10/11/2019   wound dehisence    AIR/FLUID EXCHANGE Right 12/31/2019   Procedure: AIR/FLUID EXCHANGE;  Surgeon: Jalene Mullet, MD;  Location: [facility];  Service: Ophthalmology;  Laterality: Right;   AMPUTATION Left 04/07/2017   Procedure: LEFT BELOW KNEE AMPUTATION;  Surgeon: Newt Minion, MD;  Location: [facility];  Service: Orthopedics;  Laterality: Left;   AMPUTATION Left 10/11/2019   Procedure: LEFT ABOVE KNEE AMPUTATION;  Surgeon: Newt Minion, MD;  Location: [facility];  Service: Orthopedics;  Laterality: Left;   CATARACT EXTRACTION W/PHACO Right 02/11/2020   Procedure: CATARACT EXTRACTION PHACO AND INTRAOCULAR LENS PLACEMENT (IOC);  Surgeon: Jalene Mullet, MD;  Location: [facility];  Service: Ophthalmology;  Laterality: Right;   CORONARY ARTERY BYPASS GRAFT     EYE  SURGERY     lazer   GAS INSERTION Right 12/31/2019   Procedure: INSERTION OF GAS;  Surgeon: Jalene Mullet, MD;  Location: [facility];  Service: Ophthalmology;  Laterality: Right;   INCISION AND DRAINAGE ABSCESS     multiple I&Ds   INCISION AND DRAINAGE ABSCESS Left 07/09/2012   Procedure: DRESSING CHANGE, THIGH WOUND;  Surgeon: Harl Bowie, MD;  Location: [facility];  Service: General;  Laterality: Left;   INCISION AND DRAINAGE OF WOUND Left 07/07/2012   Procedure: IRRIGATION AND DEBRIDEMENT WOUND;  Surgeon: Harl Bowie, MD;  Location: [facility];  Service: General;  Laterality: Left;   INCISION AND DRAINAGE PERIRECTAL ABSCESS Left 07/14/2012   Procedure: DEBRIDEMENT OF SKIN & SOFT TISSUE; DRESSING CHANGE UNDER ANESTHESIA;  Surgeon: Gayland Curry, MD,FACS;  Location: [facility];  Service: General;  Laterality: Left;   INCISION AND DRAINAGE PERIRECTAL ABSCESS Left 07/16/2012   Procedure: I&D Left Thigh;  Surgeon: Gwenyth Ober, MD;  Location: [facility];  Service: General;  Laterality: Left;   INCISION AND DRAINAGE PERIRECTAL ABSCESS N/A 01/05/2015   Procedure: IRRIGATION AND DEBRIDEMENT PERIRECTAL ABSCESS;  Surgeon: Donnie Mesa, MD;  Location: [facility];  Service: General;  Laterality: N/A;   IRRIGATION AND DEBRIDEMENT ABSCESS Left 07/06/2012   Procedure: IRRIGATION AND DEBRIDEMENT ABSCESS BUTTOCKS AND THIGH;  Surgeon: Shann Medal, MD;  Location: [facility];  Service: General;  Laterality: Left;   IRRIGATION AND DEBRIDEMENT ABSCESS Left 08/10/2012   Procedure: IRRIGATION AND DEBRIDEMENT ABSCESS;  Surgeon: Madilyn Hook, DO;  Location: [facility];  Service: General;  Laterality: Left;   PARS PLANA VITRECTOMY Right 12/31/2019   Procedure: PARS PLANA VITRECTOMY WITH 25 GAUGE;  Surgeon: Jalene Mullet, MD;  Location: [facility];  Service: Ophthalmology;  Laterality: Right;   PHOTOCOAGULATION WITH LASER Right 12/31/2019   Procedure: PHOTOCOAGULATION WITH LASER;  Surgeon: Jalene Mullet, MD;  Location: [facility];  Service:  Ophthalmology;  Laterality: Right;   REPAIR OF COMPLEX TRACTION RETINAL DETACHMENT Right 12/31/2019   Procedure: REPAIR OF COMPLEX TRACTION RETINAL;  Surgeon: Jalene Mullet, MD;  Location: [facility];  Service: Ophthalmology;  Laterality: Right;   STUMP REVISION Left 06/09/2017   Procedure: REVISION LEFT BELOW KNEE AMPUTATION;  Surgeon: Newt Minion, MD;  Location: [facility];  Service: Orthopedics;  Laterality: Left;   STUMP REVISION Left 09/04/2019   Procedure: LEFT BELOW KNEE AMPUTATION REVISION;  Surgeon: Newt Minion, MD;  Location: [facility];  Service: Orthopedics;  Laterality: Left;   STUMP REVISION Left 09/20/2019   Procedure: LEFT BELOW KNEE AMPUTATION REVISION;  Surgeon: Newt Minion, MD;  Location: [facility];  Service: Orthopedics;  Laterality: Left;  TONSILLECTOMY  1994    Vitals:   12/16/20 1036 12/16/20 1047 12/16/20 1057 12/16/20 1103  BP: (!) 165/91 (!) 186/97 (!) 151/106 (!) 185/99  Pulse: 86 95 (!) 105 88     Subjective Assessment - 12/16/20 1024     Subjective No new falls. Reports her breathing is better today than it has been the past few days. Pt's prosthesis noted to be rotation on arrival to sessoin.    Limitations Walking;House hold activities;Standing    How long can you stand comfortably? 5 mins    How long can you walk comfortably? 5 mins    Currently in Pain? Yes    Pain Score 5     Pain Location Generalized   mostly in the left limb, however reports pain "all over"   Pain Descriptors / Indicators Aching;Sore    Pain Type Chronic pain    Pain Onset More than a month ago    Pain Frequency Constant    Aggravating Factors  weight bearing, standing    Pain Relieving Factors rest, cortizone, meds                      [facility] Adult PT Treatment/Exercise - 12/16/20 1038       Transfers   Transfers Sit to Stand;Stand to Sit    Sit to Stand 6: Modified independent (Device/Increase time)    Stand to Sit 6: Modified independent (Device/Increase time)       Ambulation/Gait   Ambulation/Gait Yes    Ambulation/Gait Assistance 5: Supervision;4: Min guard;4: Min assist    Ambulation/Gait Assistance Details use of RW to enter/exit with pt engaging prosthetic knee with swing phase. after BP lowered gait around track with cane/contralateral HHA. pt does not engage prosthetic knee with cane, keeps knee locked out. one episode of prosthetic knee buckling with stance needing min assist to correct balance. Pt reports this has been happening a lot lately. Reports the knee is charged everynight. Discussed she may need to see the prosthetist for him to check the programming on her micro proccessor chip if this continues. Pt's BP 151/106 after gait. With seated rest break decreased to 185/99. Pt to continue to monitor BP at home and notifiy MD if continues to run high.    Ambulation Distance (Feet) 115 Feet   x1 with cane   Assistive device Rolling walker;Prosthesis;Small based quad cane    Gait Pattern Step-through pattern;Decreased stride length;Decreased stance time - left;Decreased step length - right    Ambulation Surface Level;Indoor      Neuro Re-ed    Neuro Re-ed Details  for balance with decreased UE support: with feet hip width apart for EC 30 sec's x 3 reps, then EC head movements left<>right, up<>down for ~10 reps each. min guard to min assist for balance.      Prosthetics   Prosthetic Care Comments  rotation of prosthesis corrected with tightening lanyard suspension; discussed prosthetic knee quick buckling in stance while weighted. Hydraulics not activating to assist patient. Patient reports this happening frequently lately. Educated pt to call prosthetist to ensure programming is accurate. Pt agreed.    Current prosthetic wear tolerance (days/week)  daily    Current prosthetic wear tolerance (#hours/day)  6 hours, 2x/day    Residual limb condition  intact per pt report    Education Provided Other (comment)   see comments above   Person(s) Educated  Patient    Education Method Explanation;Verbal cues    Education Method  Verbalized understanding    Donning Prosthesis Independent                      PT Short Term Goals - 12/08/20 1951       PT SHORT TERM GOAL #1   Title Pt will increase prosthetic wear time to 100% of awake hours but removing intermittently to clean sweat and inspect skin    Baseline 90% of awake hours    Time 4    Period Weeks    Status New    Target Date 12/28/20      PT SHORT TERM GOAL #2   Title Pt will demonstrate independence with updated HEP for balance, LE strengthening    Baseline HEP has not been updated yet    Time 4    Period Weeks    Status New    Target Date 12/28/20      PT SHORT TERM GOAL #3   Title Pt will demonstrate MOD I sit <> stand and stand pivot transfers with cane    Baseline min A with cane    Time 4    Period Weeks    Status New    Target Date 12/28/20      PT SHORT TERM GOAL #4   Title Pt will ambulate x 230' with cane over indoor surfaces with supervision.    Baseline Min A x 115' with cane    Time 4    Period Weeks    Status New    Target Date 12/28/20      PT SHORT TERM GOAL #5   Title Pt will perform gait velocity assessment with cane    Baseline has been using RW    Time 4    Period Weeks    Status New    Target Date 12/28/20               PT Long Term Goals - 12/08/20 1951       PT LONG TERM GOAL #1   Title Pt will be independent with final HEP in order to indicate improved functional mobility and balance with use of prosthesis.    Baseline reports completing 2-3/week, no consistent; will benefit from progressive HEP    Time 8    Period Weeks    Status Revised      PT LONG TERM GOAL #2   Title Pt will negotiate 4 stairs with cane and one rail with supervision step to sequence; will begin to learn how to ride her hydraulic knee in order to descend alternating sequence    Baseline Min A with cane and rail, step to sequence    Time 8     Period Weeks    Status New      PT LONG TERM GOAL #3   Title Pt will negotiate ramp and curb with prosthesis and cane with min A    Baseline has not performed with cane    Time 8    Period Weeks    Status New      PT LONG TERM GOAL #4   Title Pt will improve gait speed to >/= 1.8 ft/sec with Cane and prosthesis to indicate dec fall risk.    Baseline has not been assessed with cane    Time 8    Period Weeks    Status New      PT LONG TERM GOAL #5   Title Pt will ambulate x 100' outside over level  paved surfaces with Kasandra Knudsen and supervision    Baseline 115' indoors with [facility] with prosthesis with min A due to significant pain in R knee    Time 8    Period Weeks    Status New                   Plan - 12/16/20 1036     Clinical Impression Statement Today's skilled session continued to focus on prosthetic training with gait and balance. Rest breaks needed due to elevated BP with minimal activity. Pt to continued to monitor at home and call PCP if BP continues to go up with activity despite taking medication on time for past week. Pt also continues to have increased pain with standing/gait, also contributing elevated BP's. Pt noted to not engage hydraulic knee with use of cane with gait. Will benefit from practice in parallel bars with single UE support or around table to work on engaging knee with swing phase with single UE support (pt does engage knee with use of RW). Pt also noted to have quick buckling with stance while weighted. Pt to call prosthetist to see if programming needs to be adjusted as she reports charging in nightly. The pt is making steady progress and should benefit from continued PT to progress toward unmet goals.    Personal Factors and Comorbidities Comorbidity 3+;Past/Current Experience;Time since onset of injury/illness/exacerbation    Comorbidities see above    Examination-Activity Limitations Bend;Carry;Dressing;Stand;Stairs;Squat;Locomotion Level;Transfers     Examination-Participation Restrictions Community Activity;Meal Prep;Shop;Laundry    Rehab Potential Good    PT Frequency 2x / week    PT Duration 8 weeks    PT Treatment/Interventions ADLs/Self Care Home Management;DME Instruction;Gait training;Stair training;Functional mobility training;Therapeutic activities;Therapeutic exercise;Balance training;Neuromuscular re-education;Patient/family education;Prosthetic Training;Compression bandaging;Passive range of motion;Energy conservation;Iontophoresis 4mg /ml Dexamethasone;Cryotherapy;Aquatic Therapy;Manual techniques    PT Next Visit Plan Check BP!  R knee pain is limiting gait with cane - continue R knee stabilizing exercises in NWB. Continue gait with SBQC, stairs with SBQC. Update HEP as needed based on what she is doing at home already. Work on her activating knee/feeling comfortable being on prosthesis, balance, endurance. need to work on engaging hydraulics with gait and reciprocally on stairs as able.    PT Home Exercise Plan Access Code: [email or web address]    Consulted and Agree with Plan of Care Patient             Patient will benefit from skilled therapeutic intervention in order to improve the following deficits and impairments:  Abnormal gait, Decreased activity tolerance, Decreased balance, Decreased endurance, Decreased knowledge of precautions, Decreased knowledge of use of DME, Decreased mobility, Decreased range of motion, Decreased strength, Difficulty walking, Increased edema, Impaired perceived functional ability, Impaired flexibility, Impaired sensation, Postural dysfunction, Improper body mechanics, Prosthetic Dependency, Pain  Visit Diagnosis: Unsteadiness on feet  Other abnormalities of gait and mobility  Chronic pain of right knee  Abnormal posture  Muscle weakness (generalized)  Repeated falls     Problem List Patient Active Problem List   Diagnosis Date Noted   Muscle spasm 12/16/2020   Cough 05/26/2020   Chronic  pain syndrome 01/16/2020   Hx of AKA (above knee amputation), left (HCC)    Acute respiratory failure with hypoxia ([place]) 10/21/2019   Blurry vision, right eye 10/08/2019   Tobacco use 03/01/2019   Phantom limb pain ([facility]) 11/08/2018   Current moderate episode of major depressive disorder (HCC)    Hyperkalemia    Anemia of chronic disease  Fall    Status post below knee amputation, left ([facility]) 04/11/2017   Bipolar affective disorder ([place])    Heart failure with preserved ejection fraction ([facility]) 02/09/2017   Routine adult health maintenance 12/08/2016   Chronic kidney disease (CKD), stage III (moderate) ([place]) 07/15/2016   OSA (obstructive sleep apnea) 05/13/2016   Vaginal irritation 03/14/2016   Morbid obesity due to excess calories ([facility]) 11/27/2015   Subclinical hypothyroidism 04/25/2013   Controlled type 2 diabetes mellitus ([place]) 07/12/2012   Carpal tunnel syndrome, bilateral 01/25/2012   Diabetic polyneuropathy associated with type 2 diabetes mellitus ([place]) 07/04/2011   Anxiety and depression 06/02/2011   GERD 12/06/2007   Hyperlipidemia 08/29/2007   Hypertension associated with diabetes ([place]) 08/29/2007    Willow Ora, PTA, [facility] [address] [place], [place] [address] [phone] 12/16/20, 11:36 AM   Name: [name] [name] MRN: [id number] Date of Birth: 1975-07-27

## 2020-12-16 NOTE — Assessment & Plan Note (Signed)
Assessment: Patient has been using Flexeril as needed for periodic muscle spasms however patient states over the past 2 to 3 months they have become more consistent and she has been taking her Flexeril daily.  We noticed that patient was requesting monthly refills and had a telehealth appointment scheduled.  She states that the spasms are in her left thigh and right leg.  She denies any phantom limb pain.  She describes the pain as cramping/spasm pain.  While patient has had worsening of her CKD she has not yet to follow-up with [facility].  Discussed with her at her prior appointment to call and schedule an appointment immediately however it does not appears that this was done.  Discussed with Ms. Quadros that I am concerned this may be secondary to her electrolytes abnormalities.  With her severe history of hypertension and diabetes claudication is also on differential.  I would like patient to come in for an inpatient appointment for further evaluation.  I have refilled her Flexeril until that time.  Discussed with her that Flexeril is not used as long-term management for the spasms, we will have to figure out the underlying etiology.  Patient acknowledges this.  Plan: -In person appointment, I have reached out to front desk to assist with scheduling -Refilled Flexeril for 1 month

## 2020-12-20 NOTE — Progress Notes (Signed)
Internal Medicine Clinic Attending  Case discussed with Dr. Katsadouros  At the time of the visit.  We reviewed the resident's history and exam and pertinent patient test results.  I agree with the assessment, diagnosis, and plan of care documented in the resident's note.  

## 2020-12-21 ENCOUNTER — Other Ambulatory Visit: Payer: Self-pay

## 2020-12-21 ENCOUNTER — Ambulatory Visit: Payer: Medicare HMO | Attending: Orthopedic Surgery | Admitting: Physical Therapy

## 2020-12-21 DIAGNOSIS — R296 Repeated falls: Secondary | ICD-10-CM | POA: Insufficient documentation

## 2020-12-21 DIAGNOSIS — M25561 Pain in right knee: Secondary | ICD-10-CM | POA: Diagnosis not present

## 2020-12-21 DIAGNOSIS — R2689 Other abnormalities of gait and mobility: Secondary | ICD-10-CM | POA: Diagnosis not present

## 2020-12-21 DIAGNOSIS — G8929 Other chronic pain: Secondary | ICD-10-CM | POA: Diagnosis not present

## 2020-12-21 DIAGNOSIS — R2681 Unsteadiness on feet: Secondary | ICD-10-CM | POA: Diagnosis not present

## 2020-12-21 DIAGNOSIS — M6281 Muscle weakness (generalized): Secondary | ICD-10-CM | POA: Insufficient documentation

## 2020-12-22 NOTE — Therapy (Signed)
[facility] [address] [place], [place], [address] Phone: [phone]   Fax:  [phone]  Physical Therapy Treatment  Patient Details  Name: [name] [name] MRN: [id number] Date of Birth: 1976-04-13 Referring Provider (PT): Meridee Score, MD   Encounter Date: 12/21/2020   PT End of Session - 12/22/20 1908     Visit Number 17    Number of Visits 28    Date for PT Re-Evaluation 01/27/21    Authorization Type [employer] [place] and Medicaid    Authorization Time Period 6 visits approved 6/20 - 11/27/20); 9 visits from 11/30/20- 12/17/20; submitted form to John on 12/16/20 for more visists; 7-29- 01-28-21 (10 visits)    Authorization - Visit Number 1    Authorization - Number of Visits 10    Progress Note Due on Visit 20    PT Start Time 1505    PT Stop Time 1552    PT Time Calculation (min) 47 min    Equipment Utilized During Treatment Other (comment)   floatation belt, aquatic cuffs, pool noodle   Activity Tolerance Patient tolerated treatment well   limited by elevated BP with activity, needing rest breaks to allow recovery.   Behavior During Therapy [facility] for tasks assessed/performed             Past Medical History:  Diagnosis Date   Abdominal muscle pain 09/08/2016   Abnormal Pap smear of cervix 2009   Abscess    history of multiple abscesses   Acute bilateral low back pain 02/13/2017   Acute blood loss anemia    Acute on chronic renal failure ([place]) 07/12/2012   Acute renal failure ([place]) 07/12/2012   Adjustment disorder with depressed mood 03/26/2017   AKI (acute kidney injury) ([place])    Anemia 10/21/2019   Anemia of chronic disease 2002   Anxiety    Panic attacks   Bilateral lower extremity edema 05/13/2016   Bipolar disorder ([place])    Cataract    B/L cataract   Cellulitis 05/21/2014   right eye   Chronic bronchitis ([place])    "get it q yr" (05/13/2013)   Chronic pain    Chronic pain of right knee 09/08/2016    Dehiscence of amputation stump ([place])    Depression    Edema of lower extremity    Endocarditis 2002   subacute bacterial endocarditis.    Family history of anesthesia complication    "my mom has a hard time coming out from under"   Fibromyalgia    GERD (gastroesophageal reflux disease)    occ   Heart murmur    Herpes simplex type 1 infection 01/16/2018   History of blood transfusion    "just low blood count" (05/13/2013)   Hyperlipidemia    Hypertension    Hypothyroidism    Hypothyroidism, adult 03/21/2014   Hypoxia    Leukocytosis    Necrosis ([place])    and ulceration   Necrotizing fasciitis s/p OR debridements 07/06/2012   Obesity    OSA on CPAP    does not wear all the time   Peripheral neuropathy    Pneumonia    Severe protein-calorie malnutrition (HCC)    Type II diabetes mellitus ([place])    Type  II   Wound dehiscence 09/04/2019    Past Surgical History:  Procedure Laterality Date   ABDOMINAL AORTOGRAM W/LOWER EXTREMITY Left 03/19/2019   Procedure: ABDOMINAL AORTOGRAM W/LOWER EXTREMITY;  Surgeon: Serafina Mitchell, MD;  Location: [facility];  Service: Cardiovascular;  Laterality: Left;   ABOVE KNEE LEG AMPUTATION Left 10/11/2019   wound dehisence    AIR/FLUID EXCHANGE Right 12/31/2019   Procedure: AIR/FLUID EXCHANGE;  Surgeon: Jalene Mullet, MD;  Location: [facility];  Service: Ophthalmology;  Laterality: Right;   AMPUTATION Left 04/07/2017   Procedure: LEFT BELOW KNEE AMPUTATION;  Surgeon: Newt Minion, MD;  Location: [facility];  Service: Orthopedics;  Laterality: Left;   AMPUTATION Left 10/11/2019   Procedure: LEFT ABOVE KNEE AMPUTATION;  Surgeon: Newt Minion, MD;  Location: [facility];  Service: Orthopedics;  Laterality: Left;   CATARACT EXTRACTION W/PHACO Right 02/11/2020   Procedure: CATARACT EXTRACTION PHACO AND INTRAOCULAR LENS PLACEMENT (IOC);  Surgeon: Jalene Mullet, MD;  Location: [facility];  Service: Ophthalmology;  Laterality: Right;   CORONARY ARTERY BYPASS  GRAFT     EYE SURGERY     lazer   GAS INSERTION Right 12/31/2019   Procedure: INSERTION OF GAS;  Surgeon: Jalene Mullet, MD;  Location: [facility];  Service: Ophthalmology;  Laterality: Right;   INCISION AND DRAINAGE ABSCESS     multiple I&Ds   INCISION AND DRAINAGE ABSCESS Left 07/09/2012   Procedure: DRESSING CHANGE, THIGH WOUND;  Surgeon: Harl Bowie, MD;  Location: [facility];  Service: General;  Laterality: Left;   INCISION AND DRAINAGE OF WOUND Left 07/07/2012   Procedure: IRRIGATION AND DEBRIDEMENT WOUND;  Surgeon: Harl Bowie, MD;  Location: [facility];  Service: General;  Laterality: Left;   INCISION AND DRAINAGE PERIRECTAL ABSCESS Left 07/14/2012   Procedure: DEBRIDEMENT OF SKIN & SOFT TISSUE; DRESSING CHANGE UNDER ANESTHESIA;  Surgeon: [place] Curry, MD,FACS;  Location: [facility];  Service: General;  Laterality: Left;   INCISION AND DRAINAGE PERIRECTAL ABSCESS Left 07/16/2012   Procedure: I&D Left Thigh;  Surgeon: Gwenyth Ober, MD;  Location: [facility];  Service: General;  Laterality: Left;   INCISION AND DRAINAGE PERIRECTAL ABSCESS N/A 01/05/2015   Procedure: IRRIGATION AND DEBRIDEMENT PERIRECTAL ABSCESS;  Surgeon: Donnie Mesa, MD;  Location: [facility] [place];  Service: General;  Laterality: N/A;   IRRIGATION AND DEBRIDEMENT ABSCESS Left 07/06/2012   Procedure: IRRIGATION AND DEBRIDEMENT ABSCESS BUTTOCKS AND THIGH;  Surgeon: Shann Medal, MD;  Location: [facility];  Service: General;  Laterality: Left;   IRRIGATION AND DEBRIDEMENT ABSCESS Left 08/10/2012   Procedure: IRRIGATION AND DEBRIDEMENT ABSCESS;  Surgeon: Madilyn Hook, DO;  Location: [facility];  Service: General;  Laterality: Left;   PARS PLANA VITRECTOMY Right 12/31/2019   Procedure: PARS PLANA VITRECTOMY WITH 25 GAUGE;  Surgeon: Jalene Mullet, MD;  Location: [facility];  Service: Ophthalmology;  Laterality: Right;   PHOTOCOAGULATION WITH LASER Right 12/31/2019   Procedure: PHOTOCOAGULATION WITH LASER;  Surgeon: Jalene Mullet, MD;  Location: [facility];   Service: Ophthalmology;  Laterality: Right;   REPAIR OF COMPLEX TRACTION RETINAL DETACHMENT Right 12/31/2019   Procedure: REPAIR OF COMPLEX TRACTION RETINAL;  Surgeon: Jalene Mullet, MD;  Location: [facility];  Service: Ophthalmology;  Laterality: Right;   STUMP REVISION Left 06/09/2017   Procedure: REVISION LEFT BELOW KNEE AMPUTATION;  Surgeon: Newt Minion, MD;  Location: [facility];  Service: Orthopedics;  Laterality: Left;   STUMP REVISION Left 09/04/2019   Procedure: LEFT BELOW KNEE AMPUTATION REVISION;  Surgeon: Newt Minion, MD;  Location: [facility];  Service: Orthopedics;  Laterality: Left;   STUMP REVISION Left 09/20/2019   Procedure: LEFT BELOW KNEE AMPUTATION REVISION;  Surgeon: Newt Minion, MD;  Location: [facility];  Service:  Orthopedics;  Laterality: Left;   TONSILLECTOMY  1994    There were no vitals filed for this visit.   Subjective Assessment - 12/22/20 1905     Subjective Pt presents for aquatic therapy - is using her manual wheelchair, propelled by her daughter- reports no changes    Limitations Walking;House hold activities;Standing    How long can you stand comfortably? 5 mins    How long can you walk comfortably? 5 mins    Currently in Pain? Yes    Pain Score 5     Pain Location Knee    Pain Orientation Right    Pain Descriptors / Indicators Aching;Discomfort;Dull    Pain Type Chronic pain    Pain Onset More than a month ago    Pain Frequency Constant                Aquatic therapy at [facility] - pt arrived to pool area by her manual wheelchair - propelled by her daughter    Patient seen for aquatic therapy today.  Treatment took place in water 3.6-4.8 feet deep depending upon activity.  Pt entered and exited the   the pool via hydraulic chair lift; pt transferred from wheelchair to chair lift with SBA and chair lift to w/c with CGA for safety.  Pt gait trained in 4.5' water depth with use of water walker;  pt also performed gait training with use of floatation  belt with pool noodle positioned behind pt, under UE's -  gait trained 18' x approx. 10 reps -- CGA  for assist with balance  Pt performed RLE strengthening exercises - holding onto side of pool for flotation - 3.5# weight used for Rt knee extension for increased resistance 15 reps  Rt hip abduction/adduction15 reps, Rt hip extension 15 reps with use of 3.5# weight for all exercises  Pt in supine position - used noodle under arms; bicycling LE's approx. 30' x 4 reps (length of pool); hip abduction/adduction performed 30' x 4 reps   Pt requires buoyancy of water for joint off loading for reduced pain with weight bearing on RLE - pt reported decreased pain in water than that experienced with weight bearing on land.  Buoyancy resisted exercises beneficial for Lt hip flexor stretching and strengthening.  Buoyancy needed for support and for safety for reduced fall risk with activities in standing and with gait training.  Viscosity of water needed for resistance for strengthening of trunk musc. for improved core stabilization and for LE strengthening.                                     PT Short Term Goals - 12/22/20 1920       PT SHORT TERM GOAL #1   Title Pt will increase prosthetic wear time to 100% of awake hours but removing intermittently to clean sweat and inspect skin    Baseline 90% of awake hours    Time 4    Period Weeks    Status New    Target Date 12/28/20      PT SHORT TERM GOAL #2   Title Pt will demonstrate independence with updated HEP for balance, LE strengthening    Baseline HEP has not been updated yet    Time 4    Period Weeks    Status New    Target Date 12/28/20      PT SHORT TERM GOAL #  3   Title Pt will demonstrate MOD I sit <> stand and stand pivot transfers with cane    Baseline min A with cane    Time 4    Period Weeks    Status New    Target Date 12/28/20      PT SHORT TERM GOAL #4   Title Pt will ambulate x 230' with cane over  indoor surfaces with supervision.    Baseline Min A x 115' with cane    Time 4    Period Weeks    Status New    Target Date 12/28/20      PT SHORT TERM GOAL #5   Title Pt will perform gait velocity assessment with cane    Baseline has been using RW    Time 4    Period Weeks    Status New    Target Date 12/28/20               PT Long Term Goals - 12/22/20 1920       PT LONG TERM GOAL #1   Title Pt will be independent with final HEP in order to indicate improved functional mobility and balance with use of prosthesis.    Baseline reports completing 2-3/week, no consistent; will benefit from progressive HEP    Time 8    Period Weeks    Status Revised      PT LONG TERM GOAL #2   Title Pt will negotiate 4 stairs with cane and one rail with supervision step to sequence; will begin to learn how to ride her hydraulic knee in order to descend alternating sequence    Baseline Min A with cane and rail, step to sequence    Time 8    Period Weeks    Status New      PT LONG TERM GOAL #3   Title Pt will negotiate ramp and curb with prosthesis and cane with min A    Baseline has not performed with cane    Time 8    Period Weeks    Status New      PT LONG TERM GOAL #4   Title Pt will improve gait speed to >/= 1.8 ft/sec with Cane and prosthesis to indicate dec fall risk.    Baseline has not been assessed with cane    Time 8    Period Weeks    Status New      PT LONG TERM GOAL #5   Title Pt will ambulate x 100' outside over level paved surfaces with Kasandra Knudsen and supervision    Baseline 115' indoors with [facility] with prosthesis with min A due to significant pain in R knee    Time 8    Period Weeks    Status New                   Plan - 12/22/20 1911     Clinical Impression Statement Aquatic therapy session focused on gait training with use of water walker and also with pool noodle with floatation belt for increased buoyancy for increased unweighting for reduced pain in  RLE.  Pt reported increased pain in RLE in 3.8' - 4.0' but reported less pain in 4.5' - 4.8' water depth.  Pt had more upright posture with pool noodle positioned behind pt rather than in front.  Pt tolerated strengthening exercises with use of 3.5# weight on RLE - able to hold onto side of pool with bil. UE support  in corner for support.  Cont with POC.    Personal Factors and Comorbidities Comorbidity 3+;Past/Current Experience;Time since onset of injury/illness/exacerbation    Comorbidities see above    Examination-Activity Limitations Bend;Carry;Dressing;Stand;Stairs;Squat;Locomotion Level;Transfers    Examination-Participation Restrictions Community Activity;Meal Prep;Shop;Laundry    Rehab Potential Good    PT Frequency 2x / week    PT Duration 8 weeks    PT Treatment/Interventions ADLs/Self Care Home Management;DME Instruction;Gait training;Stair training;Functional mobility training;Therapeutic activities;Therapeutic exercise;Balance training;Neuromuscular re-education;Patient/family education;Prosthetic Training;Compression bandaging;Passive range of motion;Energy conservation;Iontophoresis 4mg /ml Dexamethasone;Cryotherapy;Aquatic Therapy;Manual techniques    PT Next Visit Plan Check BP!  R knee pain is limiting gait with cane - continue R knee stabilizing exercises in NWB. Continue gait with SBQC, stairs with SBQC. Update HEP as needed based on what she is doing at home already. Work on her activating knee/feeling comfortable being on prosthesis, balance, endurance. need to work on engaging hydraulics with gait and reciprocally on stairs as able.    PT Home Exercise Plan Access Code: [email or web address]    Consulted and Agree with Plan of Care Patient             Patient will benefit from skilled therapeutic intervention in order to improve the following deficits and impairments:  Abnormal gait, Decreased activity tolerance, Decreased balance, Decreased endurance, Decreased knowledge of precautions,  Decreased knowledge of use of DME, Decreased mobility, Decreased range of motion, Decreased strength, Difficulty walking, Increased edema, Impaired perceived functional ability, Impaired flexibility, Impaired sensation, Postural dysfunction, Improper body mechanics, Prosthetic Dependency, Pain  Visit Diagnosis: Other abnormalities of gait and mobility  Unsteadiness on feet  Muscle weakness (generalized)     Problem List Patient Active Problem List   Diagnosis Date Noted   Muscle spasm 12/16/2020   Cough 05/26/2020   Chronic pain syndrome 01/16/2020   Hx of AKA (above knee amputation), left (HCC)    Acute respiratory failure with hypoxia ([place]) 10/21/2019   Blurry vision, right eye 10/08/2019   Tobacco use 03/01/2019   Phantom limb pain ([place]) 11/08/2018   Current moderate episode of major depressive disorder ([place])    Hyperkalemia    Anemia of chronic disease    Fall    Status post below knee amputation, left ([place]) 04/11/2017   Bipolar affective disorder ([place])    Heart failure with preserved ejection fraction ([place]) 02/09/2017   Routine adult health maintenance 12/08/2016   Chronic kidney disease (CKD), stage III (moderate) ([place]) 07/15/2016   OSA (obstructive sleep apnea) 05/13/2016   Vaginal irritation 03/14/2016   Morbid obesity due to excess calories ([place]) 11/27/2015   Subclinical hypothyroidism 04/25/2013   Controlled type 2 diabetes mellitus ([place]) 07/12/2012   Carpal tunnel syndrome, bilateral 01/25/2012   Diabetic polyneuropathy associated with type 2 diabetes mellitus ([place]) 07/04/2011   Anxiety and depression 06/02/2011   GERD 12/06/2007   Hyperlipidemia 08/29/2007   Hypertension associated with diabetes ([place]) 08/29/2007    Kittie Krizan, [place], PT, [place] 12/22/2020, 7:22 PM  [facility] [address] [place], [place], [address] Phone: [phone]   Fax:  [phone]  Name: [name] [name] MRN:  [id number] Date of Birth: 12/21/1975

## 2020-12-25 ENCOUNTER — Ambulatory Visit: Payer: Medicare HMO | Admitting: Physical Therapy

## 2020-12-25 ENCOUNTER — Other Ambulatory Visit: Payer: Self-pay

## 2020-12-25 DIAGNOSIS — G8929 Other chronic pain: Secondary | ICD-10-CM | POA: Diagnosis not present

## 2020-12-25 DIAGNOSIS — M6281 Muscle weakness (generalized): Secondary | ICD-10-CM | POA: Diagnosis not present

## 2020-12-25 DIAGNOSIS — R2689 Other abnormalities of gait and mobility: Secondary | ICD-10-CM

## 2020-12-25 DIAGNOSIS — M25561 Pain in right knee: Secondary | ICD-10-CM

## 2020-12-25 DIAGNOSIS — R296 Repeated falls: Secondary | ICD-10-CM

## 2020-12-25 DIAGNOSIS — R2681 Unsteadiness on feet: Secondary | ICD-10-CM | POA: Diagnosis not present

## 2020-12-25 NOTE — Therapy (Signed)
[facility] [address] [place], [place], [address] Phone: [phone]   Fax:  [phone]  Physical Therapy Treatment  Patient Details  Name: [name] [name] MRN: [id number] Date of Birth: July 30, 1975 Referring Provider (PT): Meridee Score, MD   Encounter Date: 12/25/2020   PT End of Session - 12/25/20 1134     Visit Number 18    Number of Visits 28    Date for PT Re-Evaluation 01/28/21    Authorization Type [employer] [place] and Medicaid    Authorization Time Period 7-29- 01-28-21 (10 visits)    Authorization - Visit Number 2    Authorization - Number of Visits 10    Progress Note Due on Visit 20    PT Start Time 1019    PT Stop Time 1100    PT Time Calculation (min) 41 min    Activity Tolerance Patient limited by pain   knee pain   Behavior During Therapy [facility] for tasks assessed/performed             Past Medical History:  Diagnosis Date   Abdominal muscle pain 09/08/2016   Abnormal Pap smear of cervix 2009   Abscess    history of multiple abscesses   Acute bilateral low back pain 02/13/2017   Acute blood loss anemia    Acute on chronic renal failure ([place]) 07/12/2012   Acute renal failure ([place]) 07/12/2012   Adjustment disorder with depressed mood 03/26/2017   AKI (acute kidney injury) ([place])    Anemia 10/21/2019   Anemia of chronic disease 2002   Anxiety    Panic attacks   Bilateral lower extremity edema 05/13/2016   Bipolar disorder ([place])    Cataract    B/L cataract   Cellulitis 05/21/2014   right eye   Chronic bronchitis ([place])    "get it q yr" (05/13/2013)   Chronic pain    Chronic pain of right knee 09/08/2016   Dehiscence of amputation stump (HCC)    Depression    Edema of lower extremity    Endocarditis 2002   subacute bacterial endocarditis.    Family history of anesthesia complication    "my mom has a hard time coming out from under"   Fibromyalgia    GERD (gastroesophageal reflux disease)     occ   Heart murmur    Herpes simplex type 1 infection 01/16/2018   History of blood transfusion    "just low blood count" (05/13/2013)   Hyperlipidemia    Hypertension    Hypothyroidism    Hypothyroidism, adult 03/21/2014   Hypoxia    Leukocytosis    Necrosis (HCC)    and ulceration   Necrotizing fasciitis s/p OR debridements 07/06/2012   Obesity    OSA on CPAP    does not wear all the time   Peripheral neuropathy    Pneumonia    Severe protein-calorie malnutrition (HCC)    Type II diabetes mellitus ([place])    Type  II   Wound dehiscence 09/04/2019    Past Surgical History:  Procedure Laterality Date   ABDOMINAL AORTOGRAM W/LOWER EXTREMITY Left 03/19/2019   Procedure: ABDOMINAL AORTOGRAM W/LOWER EXTREMITY;  Surgeon: Serafina Mitchell, MD;  Location: [facility];  Service: Cardiovascular;  Laterality: Left;   ABOVE KNEE LEG AMPUTATION Left 10/11/2019   wound dehisence    AIR/FLUID EXCHANGE Right 12/31/2019   Procedure: AIR/FLUID EXCHANGE;  Surgeon: Jalene Mullet, MD;  Location: [facility];  Service: Ophthalmology;  Laterality:  Right;   AMPUTATION Left 04/07/2017   Procedure: LEFT BELOW KNEE AMPUTATION;  Surgeon: Newt Minion, MD;  Location: [facility];  Service: Orthopedics;  Laterality: Left;   AMPUTATION Left 10/11/2019   Procedure: LEFT ABOVE KNEE AMPUTATION;  Surgeon: Newt Minion, MD;  Location: [facility];  Service: Orthopedics;  Laterality: Left;   CATARACT EXTRACTION W/PHACO Right 02/11/2020   Procedure: CATARACT EXTRACTION PHACO AND INTRAOCULAR LENS PLACEMENT (IOC);  Surgeon: Jalene Mullet, MD;  Location: [facility];  Service: Ophthalmology;  Laterality: Right;   CORONARY ARTERY BYPASS GRAFT     EYE SURGERY     lazer   GAS INSERTION Right 12/31/2019   Procedure: INSERTION OF GAS;  Surgeon: Jalene Mullet, MD;  Location: [facility];  Service: Ophthalmology;  Laterality: Right;   INCISION AND DRAINAGE ABSCESS     multiple I&Ds   INCISION AND DRAINAGE ABSCESS Left 07/09/2012    Procedure: DRESSING CHANGE, THIGH WOUND;  Surgeon: Harl Bowie, MD;  Location: [facility];  Service: General;  Laterality: Left;   INCISION AND DRAINAGE OF WOUND Left 07/07/2012   Procedure: IRRIGATION AND DEBRIDEMENT WOUND;  Surgeon: Harl Bowie, MD;  Location: [facility];  Service: General;  Laterality: Left;   INCISION AND DRAINAGE PERIRECTAL ABSCESS Left 07/14/2012   Procedure: DEBRIDEMENT OF SKIN & SOFT TISSUE; DRESSING CHANGE UNDER ANESTHESIA;  Surgeon: Gayland Curry, MD,FACS;  Location: [facility];  Service: General;  Laterality: Left;   INCISION AND DRAINAGE PERIRECTAL ABSCESS Left 07/16/2012   Procedure: I&D Left Thigh;  Surgeon: Gwenyth Ober, MD;  Location: [facility];  Service: General;  Laterality: Left;   INCISION AND DRAINAGE PERIRECTAL ABSCESS N/A 01/05/2015   Procedure: IRRIGATION AND DEBRIDEMENT PERIRECTAL ABSCESS;  Surgeon: Donnie Mesa, MD;  Location: [facility];  Service: General;  Laterality: N/A;   IRRIGATION AND DEBRIDEMENT ABSCESS Left 07/06/2012   Procedure: IRRIGATION AND DEBRIDEMENT ABSCESS BUTTOCKS AND THIGH;  Surgeon: Shann Medal, MD;  Location: [facility];  Service: General;  Laterality: Left;   IRRIGATION AND DEBRIDEMENT ABSCESS Left 08/10/2012   Procedure: IRRIGATION AND DEBRIDEMENT ABSCESS;  Surgeon: Madilyn Hook, DO;  Location: [facility];  Service: General;  Laterality: Left;   PARS PLANA VITRECTOMY Right 12/31/2019   Procedure: PARS PLANA VITRECTOMY WITH 25 GAUGE;  Surgeon: Jalene Mullet, MD;  Location: [facility];  Service: Ophthalmology;  Laterality: Right;   PHOTOCOAGULATION WITH LASER Right 12/31/2019   Procedure: PHOTOCOAGULATION WITH LASER;  Surgeon: Jalene Mullet, MD;  Location: [facility];  Service: Ophthalmology;  Laterality: Right;   REPAIR OF COMPLEX TRACTION RETINAL DETACHMENT Right 12/31/2019   Procedure: REPAIR OF COMPLEX TRACTION RETINAL;  Surgeon: Jalene Mullet, MD;  Location: [facility];  Service: Ophthalmology;  Laterality: Right;   STUMP REVISION Left 06/09/2017   Procedure:  REVISION LEFT BELOW KNEE AMPUTATION;  Surgeon: Newt Minion, MD;  Location: [facility];  Service: Orthopedics;  Laterality: Left;   STUMP REVISION Left 09/04/2019   Procedure: LEFT BELOW KNEE AMPUTATION REVISION;  Surgeon: Newt Minion, MD;  Location: [facility];  Service: Orthopedics;  Laterality: Left;   STUMP REVISION Left 09/20/2019   Procedure: LEFT BELOW KNEE AMPUTATION REVISION;  Surgeon: Newt Minion, MD;  Location: [facility];  Service: Orthopedics;  Laterality: Left;   TONSILLECTOMY  1994    There were no vitals filed for this visit.   Subjective Assessment - 12/25/20 1028     Subjective Having pain today; aquatic therapy is going well.  Not sure if she scheduled  transportation for the pool on Monday.    Limitations Walking;House hold activities;Standing    Patient Stated Goals "I want to be able to walk on this leg and move around"    Currently in Pain? Yes    Pain Location Knee    Pain Orientation Right    Pain Descriptors / Indicators Aching;Discomfort                [facility] PT Assessment - 12/25/20 1036       Standardized Balance Assessment   Standardized Balance Assessment 10 meter walk test    10 Meter Walk 1.11 (71 seconds) with cane and min A: 0.14 m/sec or .37 ft/sec             Prosthetics Assessment - 12/25/20 1143       Prosthetics   Prosthetic Care Independent with Skin check;Residual limb care;Prosthetic cleaning;Correct ply sock adjustment;Proper wear schedule/adjustment    Prosthetic Care Comments  pt independently corrected internal rotation of prosthesis and liner    Donning prosthesis  Independent    Doffing prosthesis  Independent    Current prosthetic wear tolerance (days/week)  12 hours total with breaks to allow skin to breathe and dry off    Current prosthetic wear tolerance (#hours/day)  6 hours, 2x/day    Prosthesis Description velcro strap suspension with microprocessor knee                          [facility] Adult PT  Treatment/Exercise - 12/25/20 1036       Transfers   Transfers Sit to Stand;Stand to [employer] Transfers    Sit to Stand 5: Supervision    Sit to Stand Details (indicate cue type and reason) with small based quad cane from mat without arm rest and then from chair with arm rest with supervision when setting L knee.    Stand to Sit 5: Supervision    Stand to Sit Details with cane to chair and mat    Stand Pivot Transfers 5: Supervision    Stand Pivot Transfer Details (indicate cue type and reason) with SBQC x 2 reps focusing on safety and sequencing with pivot.  Difficulty pivoting on R knee.      Ambulation/Gait   Ambulation/Gait Yes    Ambulation/Gait Assistance 4: Min assist    Ambulation/Gait Assistance Details with use of cane; pt's prosthesis noted to be internally rotated causing increased pain in knee and back.    Ambulation Distance (Feet) 50 Feet    Assistive device Small based quad cane    Ambulation Surface Level;Indoor      Therapeutic Activites    Therapeutic Activities Other Therapeutic Activities    Other Therapeutic Activities provided pt with assistance and min guard standing balance while pt doffed prosthesis and liner and reset position and rotation of liner to bring prosthesis into more neutral rotation.                      PT Short Term Goals - 12/25/20 1029       PT SHORT TERM GOAL #1   Title Pt will increase prosthetic wear time to 100% of awake hours but removing intermittently to clean sweat and inspect skin    Baseline 12 hours/day 7am-7pm with intermittent removal to clean sweat and inspect, Dr. Sharol Given is watching callous on limb    Time 4    Period Weeks    Status Achieved  Target Date 12/28/20      PT SHORT TERM GOAL #2   Title Pt will demonstrate independence with updated HEP for balance, LE strengthening    Baseline --    Time 4    Period Weeks    Status Achieved    Target Date 12/28/20      PT SHORT TERM GOAL #3   Title  Pt will demonstrate MOD I sit <> stand and stand pivot transfers with cane    Baseline Supervision with cane    Time 4    Period Weeks    Status Partially Met    Target Date 12/28/20      PT SHORT TERM GOAL #4   Title Pt will ambulate x 230' with cane over indoor surfaces with supervision.    Baseline Min A x 115' with cane    Time 4    Period Weeks    Status On-going    Target Date 12/28/20      PT SHORT TERM GOAL #5   Title Pt will perform gait velocity assessment with cane    Baseline performed today;    Time 4    Period Weeks    Status Achieved    Target Date 12/28/20               PT Long Term Goals - 12/25/20 1137       PT LONG TERM GOAL #1   Title Pt will be independent with final HEP in order to indicate improved functional mobility and balance with use of prosthesis.    Baseline reports completing 2-3/week, no consistent; will benefit from progressive HEP    Time 8    Period Weeks    Status Revised    Target Date 01/28/21      PT LONG TERM GOAL #2   Title Pt will negotiate 4 stairs with cane and one rail with supervision step to sequence; will begin to learn how to ride her hydraulic knee in order to descend alternating sequence    Baseline Min A with cane and rail, step to sequence    Time 8    Period Weeks    Status New    Target Date 01/28/21      PT LONG TERM GOAL #3   Title Pt will negotiate ramp and curb with prosthesis and cane with min A    Baseline has not performed with cane    Time 8    Period Weeks    Status New    Target Date 01/28/21      PT LONG TERM GOAL #4   Title Pt will improve gait speed to >/= 0.52f/sec with Cane and prosthesis to indicate dec fall risk.    Baseline .4 ft/sec with cane    Time 8    Period Weeks    Status Revised    Target Date 01/28/21      PT LONG TERM GOAL #5   Title Pt will ambulate x 100' outside over level paved surfaces with CKasandra Knudsenand supervision    Baseline 115' [facility]  with min A due to significant pain in R knee    Time 8    Period Weeks    Status New    Target Date 01/28/21                   Plan - 12/25/20 1138     Clinical Impression Statement Initiated assessment of STG today.  Pt is making steady progress towards goals and has met 3/5 STG, partially meeting one goal and one goal unable to assess today due to issues with prosthesis rotation, pt required increased time to doff and re-don prosthesis in more neutral alignment.  Able to perform transfer and gait velocity assessment with cane today with supervision-min guard.  Will complete STG assessment next land visit.  Pt continues to be significantly limited by R knee pain.  Will continue to address and progress towards LTG which have been updated.    Personal Factors and Comorbidities Comorbidity 3+;Past/Current Experience;Time since onset of injury/illness/exacerbation    Comorbidities see above    Examination-Activity Limitations Bend;Carry;Dressing;Stand;Stairs;Squat;Locomotion Level;Transfers    Examination-Participation Restrictions Community Activity;Meal Prep;Shop;Laundry    Rehab Potential Good    PT Frequency 2x / week    PT Duration 8 weeks    PT Treatment/Interventions ADLs/Self Care Home Management;DME Instruction;Gait training;Stair training;Functional mobility training;Therapeutic activities;Therapeutic exercise;Balance training;Neuromuscular re-education;Patient/family education;Prosthetic Training;Compression bandaging;Passive range of motion;Energy conservation;Iontophoresis 37m/ml Dexamethasone;Cryotherapy;Aquatic Therapy;Manual techniques    PT Next Visit Plan Check BP!  Next land visit, 10th visit PN and CHECK FINAL STG-gait around gym 230' with cane.   R knee pain is limiting gait with cane - continue R knee stabilizing exercises in NWB. Continue gait with SBQC, stairs with SBQC. Update HEP as needed based on what she is doing at home already. Work on her activating  knee/feeling comfortable being on prosthesis, balance, endurance. need to work on engaging hydraulics with gait and reciprocally on stairs as able.    PT Home Exercise Plan Access Code: [email or web address]   Consulted and Agree with Plan of Care Patient             Patient will benefit from skilled therapeutic intervention in order to improve the following deficits and impairments:  Abnormal gait, Decreased activity tolerance, Decreased balance, Decreased endurance, Decreased knowledge of precautions, Decreased knowledge of use of DME, Decreased mobility, Decreased range of motion, Decreased strength, Difficulty walking, Increased edema, Impaired perceived functional ability, Impaired flexibility, Impaired sensation, Postural dysfunction, Improper body mechanics, Prosthetic Dependency, Pain  Visit Diagnosis: Other abnormalities of gait and mobility  Unsteadiness on feet  Muscle weakness (generalized)  Chronic pain of right knee  Repeated falls     Problem List Patient Active Problem List   Diagnosis Date Noted   Muscle spasm 12/16/2020   Cough 05/26/2020   Chronic pain syndrome 01/16/2020   Hx of AKA (above knee amputation), left (HCC)    Acute respiratory failure with hypoxia ([facility] 10/21/2019   Blurry vision, right eye 10/08/2019   Tobacco use 03/01/2019   Phantom limb pain ([facility] 11/08/2018   Current moderate episode of major depressive disorder ([place]    Hyperkalemia    Anemia of chronic disease    Fall    Status post below knee amputation, left ([facility] 04/11/2017   Bipolar affective disorder ([facility]    Heart failure with preserved ejection fraction ([facility] 02/09/2017   Routine adult health maintenance 12/08/2016   Chronic kidney disease (CKD), stage III (moderate) ([facility] 07/15/2016   OSA (obstructive sleep apnea) 05/13/2016   Vaginal irritation 03/14/2016   Morbid obesity due to excess calories ([facility] 11/27/2015   Subclinical hypothyroidism 04/25/2013   Controlled type 2 diabetes  mellitus ([facility] 07/12/2012   Carpal tunnel syndrome, bilateral 01/25/2012   Diabetic polyneuropathy associated with type 2 diabetes mellitus ([facility] 07/04/2011   Anxiety and depression 06/02/2011   GERD 12/06/2007   Hyperlipidemia 08/29/2007   Hypertension associated  with diabetes ([place]) 08/29/2007    Rico Junker, PT, DPT 12/25/20    11:45 AM    [facility] [address] [place], [place], [address] Phone: [phone]   Fax:  [phone]  Name: [name] [name] MRN: [id number] Date of Birth: [birth year]

## 2020-12-27 DIAGNOSIS — Z89512 Acquired absence of left leg below knee: Secondary | ICD-10-CM | POA: Diagnosis not present

## 2020-12-28 ENCOUNTER — Ambulatory Visit: Payer: Medicare HMO | Admitting: Physical Therapy

## 2020-12-29 ENCOUNTER — Ambulatory Visit: Payer: Medicare HMO

## 2020-12-30 ENCOUNTER — Ambulatory Visit: Payer: Medicare HMO | Admitting: Behavioral Health

## 2020-12-30 ENCOUNTER — Telehealth: Payer: Self-pay | Admitting: Behavioral Health

## 2020-12-30 NOTE — Addendum Note (Signed)
Addended by: Hulan Fray on: 12/30/2020 06:36 PM   Modules accepted: Orders

## 2020-12-30 NOTE — Telephone Encounter (Signed)
Contacted Pt multiple times for 2:30pm telehealth appt. Lft msg for Pt to r/s.  Dr. Theodis Shove

## 2020-12-30 NOTE — Telephone Encounter (Signed)
Contacted Pt multiple times for 2:30pm appt. Phone sts same msg ea time. Will suggest Pt r/s @ their convenience.  Dr. Theodis Shove

## 2020-12-31 ENCOUNTER — Encounter (HOSPITAL_COMMUNITY): Payer: Self-pay | Admitting: Emergency Medicine

## 2020-12-31 ENCOUNTER — Other Ambulatory Visit: Payer: Self-pay

## 2020-12-31 ENCOUNTER — Emergency Department (HOSPITAL_COMMUNITY): Payer: Medicare HMO

## 2020-12-31 ENCOUNTER — Inpatient Hospital Stay (HOSPITAL_COMMUNITY)
Admission: EM | Admit: 2020-12-31 | Discharge: 2021-01-12 | DRG: 674 | Disposition: A | Discharge status: Home / Self Care | Payer: Medicare HMO | Attending: Family Medicine | Admitting: Family Medicine

## 2020-12-31 DIAGNOSIS — N179 Acute kidney failure, unspecified: Principal | ICD-10-CM

## 2020-12-31 DIAGNOSIS — M797 Fibromyalgia: Secondary | ICD-10-CM | POA: Diagnosis present

## 2020-12-31 DIAGNOSIS — D638 Anemia in other chronic diseases classified elsewhere: Secondary | ICD-10-CM | POA: Diagnosis present

## 2020-12-31 DIAGNOSIS — Z992 Dependence on renal dialysis: Secondary | ICD-10-CM

## 2020-12-31 DIAGNOSIS — K219 Gastro-esophageal reflux disease without esophagitis: Secondary | ICD-10-CM | POA: Diagnosis present

## 2020-12-31 DIAGNOSIS — N10 Acute tubulo-interstitial nephritis: Secondary | ICD-10-CM | POA: Diagnosis present

## 2020-12-31 DIAGNOSIS — N3289 Other specified disorders of bladder: Secondary | ICD-10-CM | POA: Diagnosis not present

## 2020-12-31 DIAGNOSIS — Z881 Allergy status to other antibiotic agents status: Secondary | ICD-10-CM

## 2020-12-31 DIAGNOSIS — G4733 Obstructive sleep apnea (adult) (pediatric): Secondary | ICD-10-CM | POA: Diagnosis present

## 2020-12-31 DIAGNOSIS — Z20822 Contact with and (suspected) exposure to covid-19: Secondary | ICD-10-CM | POA: Diagnosis present

## 2020-12-31 DIAGNOSIS — Z794 Long term (current) use of insulin: Secondary | ICD-10-CM | POA: Diagnosis not present

## 2020-12-31 DIAGNOSIS — E871 Hypo-osmolality and hyponatremia: Secondary | ICD-10-CM | POA: Diagnosis present

## 2020-12-31 DIAGNOSIS — I10 Essential (primary) hypertension: Secondary | ICD-10-CM | POA: Diagnosis not present

## 2020-12-31 DIAGNOSIS — I132 Hypertensive heart and chronic kidney disease with heart failure and with stage 5 chronic kidney disease, or end stage renal disease: Secondary | ICD-10-CM | POA: Diagnosis not present

## 2020-12-31 DIAGNOSIS — E860 Dehydration: Secondary | ICD-10-CM | POA: Diagnosis present

## 2020-12-31 DIAGNOSIS — D631 Anemia in chronic kidney disease: Secondary | ICD-10-CM | POA: Diagnosis present

## 2020-12-31 DIAGNOSIS — E1159 Type 2 diabetes mellitus with other circulatory complications: Secondary | ICD-10-CM | POA: Diagnosis not present

## 2020-12-31 DIAGNOSIS — Z9119 Patient's noncompliance with other medical treatment and regimen: Secondary | ICD-10-CM

## 2020-12-31 DIAGNOSIS — E1142 Type 2 diabetes mellitus with diabetic polyneuropathy: Secondary | ICD-10-CM | POA: Diagnosis present

## 2020-12-31 DIAGNOSIS — F319 Bipolar disorder, unspecified: Secondary | ICD-10-CM | POA: Diagnosis present

## 2020-12-31 DIAGNOSIS — F17210 Nicotine dependence, cigarettes, uncomplicated: Secondary | ICD-10-CM | POA: Diagnosis present

## 2020-12-31 DIAGNOSIS — Z79899 Other long term (current) drug therapy: Secondary | ICD-10-CM

## 2020-12-31 DIAGNOSIS — E1151 Type 2 diabetes mellitus with diabetic peripheral angiopathy without gangrene: Secondary | ICD-10-CM

## 2020-12-31 DIAGNOSIS — Z89512 Acquired absence of left leg below knee: Secondary | ICD-10-CM

## 2020-12-31 DIAGNOSIS — E1129 Type 2 diabetes mellitus with other diabetic kidney complication: Secondary | ICD-10-CM | POA: Diagnosis not present

## 2020-12-31 DIAGNOSIS — R590 Localized enlarged lymph nodes: Secondary | ICD-10-CM | POA: Diagnosis not present

## 2020-12-31 DIAGNOSIS — F410 Panic disorder [episodic paroxysmal anxiety] without agoraphobia: Secondary | ICD-10-CM | POA: Diagnosis present

## 2020-12-31 DIAGNOSIS — N12 Tubulo-interstitial nephritis, not specified as acute or chronic: Secondary | ICD-10-CM | POA: Diagnosis not present

## 2020-12-31 DIAGNOSIS — E875 Hyperkalemia: Secondary | ICD-10-CM | POA: Diagnosis present

## 2020-12-31 DIAGNOSIS — Z6841 Body Mass Index (BMI) 40.0 and over, adult: Secondary | ICD-10-CM

## 2020-12-31 DIAGNOSIS — I160 Hypertensive urgency: Secondary | ICD-10-CM | POA: Diagnosis present

## 2020-12-31 DIAGNOSIS — Z20828 Contact with and (suspected) exposure to other viral communicable diseases: Secondary | ICD-10-CM | POA: Diagnosis not present

## 2020-12-31 DIAGNOSIS — N186 End stage renal disease: Secondary | ICD-10-CM | POA: Diagnosis present

## 2020-12-31 DIAGNOSIS — I82622 Acute embolism and thrombosis of deep veins of left upper extremity: Secondary | ICD-10-CM | POA: Diagnosis not present

## 2020-12-31 DIAGNOSIS — I5030 Unspecified diastolic (congestive) heart failure: Secondary | ICD-10-CM | POA: Diagnosis not present

## 2020-12-31 DIAGNOSIS — Z89612 Acquired absence of left leg above knee: Secondary | ICD-10-CM | POA: Diagnosis not present

## 2020-12-31 DIAGNOSIS — E8779 Other fluid overload: Secondary | ICD-10-CM | POA: Diagnosis not present

## 2020-12-31 DIAGNOSIS — E872 Acidosis: Secondary | ICD-10-CM | POA: Diagnosis present

## 2020-12-31 DIAGNOSIS — K5900 Constipation, unspecified: Secondary | ICD-10-CM | POA: Diagnosis present

## 2020-12-31 DIAGNOSIS — N185 Chronic kidney disease, stage 5: Secondary | ICD-10-CM | POA: Diagnosis not present

## 2020-12-31 DIAGNOSIS — I129 Hypertensive chronic kidney disease with stage 1 through stage 4 chronic kidney disease, or unspecified chronic kidney disease: Secondary | ICD-10-CM | POA: Diagnosis not present

## 2020-12-31 DIAGNOSIS — E039 Hypothyroidism, unspecified: Secondary | ICD-10-CM | POA: Diagnosis present

## 2020-12-31 DIAGNOSIS — G894 Chronic pain syndrome: Secondary | ICD-10-CM | POA: Diagnosis present

## 2020-12-31 DIAGNOSIS — N184 Chronic kidney disease, stage 4 (severe): Secondary | ICD-10-CM | POA: Diagnosis not present

## 2020-12-31 DIAGNOSIS — Z833 Family history of diabetes mellitus: Secondary | ICD-10-CM

## 2020-12-31 DIAGNOSIS — Z841 Family history of disorders of kidney and ureter: Secondary | ICD-10-CM

## 2020-12-31 DIAGNOSIS — Z8249 Family history of ischemic heart disease and other diseases of the circulatory system: Secondary | ICD-10-CM

## 2020-12-31 DIAGNOSIS — E1122 Type 2 diabetes mellitus with diabetic chronic kidney disease: Secondary | ICD-10-CM | POA: Diagnosis present

## 2020-12-31 DIAGNOSIS — I120 Hypertensive chronic kidney disease with stage 5 chronic kidney disease or end stage renal disease: Secondary | ICD-10-CM | POA: Diagnosis present

## 2020-12-31 DIAGNOSIS — K8020 Calculus of gallbladder without cholecystitis without obstruction: Secondary | ICD-10-CM | POA: Diagnosis not present

## 2020-12-31 DIAGNOSIS — R112 Nausea with vomiting, unspecified: Secondary | ICD-10-CM | POA: Diagnosis not present

## 2020-12-31 DIAGNOSIS — E785 Hyperlipidemia, unspecified: Secondary | ICD-10-CM | POA: Diagnosis present

## 2020-12-31 DIAGNOSIS — D649 Anemia, unspecified: Secondary | ICD-10-CM | POA: Diagnosis not present

## 2020-12-31 DIAGNOSIS — D179 Benign lipomatous neoplasm, unspecified: Secondary | ICD-10-CM | POA: Diagnosis not present

## 2020-12-31 DIAGNOSIS — Z951 Presence of aortocoronary bypass graft: Secondary | ICD-10-CM

## 2020-12-31 DIAGNOSIS — E119 Type 2 diabetes mellitus without complications: Secondary | ICD-10-CM

## 2020-12-31 DIAGNOSIS — Z419 Encounter for procedure for purposes other than remedying health state, unspecified: Secondary | ICD-10-CM

## 2020-12-31 DIAGNOSIS — I517 Cardiomegaly: Secondary | ICD-10-CM | POA: Diagnosis not present

## 2020-12-31 HISTORY — DX: Tubulo-interstitial nephritis, not specified as acute or chronic: N12

## 2020-12-31 LAB — COMPREHENSIVE METABOLIC PANEL WITH GFR
ALT: 9 U/L (ref 0–44)
AST: 10 U/L — ABNORMAL LOW (ref 15–41)
Albumin: 3.4 g/dL — ABNORMAL LOW (ref 3.5–5.0)
Alkaline Phosphatase: 75 U/L (ref 38–126)
Anion gap: 11 (ref 5–15)
BUN: 68 mg/dL — ABNORMAL HIGH (ref 6–20)
CO2: 19 mmol/L — ABNORMAL LOW (ref 22–32)
Calcium: 8.9 mg/dL (ref 8.9–10.3)
Chloride: 112 mmol/L — ABNORMAL HIGH (ref 98–111)
Creatinine, Ser: 6.26 mg/dL — ABNORMAL HIGH (ref 0.44–1.00)
GFR, Estimated: 8 mL/min — ABNORMAL LOW (ref >60)
Glucose, Bld: 157 mg/dL — ABNORMAL HIGH (ref 70–99)
Potassium: 5.3 mmol/L — ABNORMAL HIGH (ref 3.5–5.1)
Sodium: 142 mmol/L (ref 135–145)
Total Bilirubin: 0.5 mg/dL (ref 0.3–1.2)
Total Protein: 7.6 g/dL (ref 6.5–8.1)

## 2020-12-31 LAB — I-STAT BETA HCG BLOOD, ED (MC, WL, AP ONLY): I-stat hCG, quantitative: <5 m[IU]/mL (ref <5)

## 2020-12-31 LAB — URINALYSIS, ROUTINE W REFLEX MICROSCOPIC
Bilirubin Urine: NEGATIVE
Glucose, UA: 150 mg/dL — AB
Ketones, ur: 5 mg/dL — AB
Nitrite: NEGATIVE
Protein, ur: >=300 mg/dL — AB
Specific Gravity, Urine: 1.016 (ref 1.005–1.030)
WBC, UA: >50 WBC/hpf — ABNORMAL HIGH (ref 0–5)
pH: 6 (ref 5.0–8.0)

## 2020-12-31 LAB — CBC WITH DIFFERENTIAL/PLATELET
Abs Immature Granulocytes: 0.05 K/uL (ref 0.00–0.07)
Basophils Absolute: 0 K/uL (ref 0.0–0.1)
Basophils Relative: 0 %
Eosinophils Absolute: 0 K/uL (ref 0.0–0.5)
Eosinophils Relative: 0 %
HCT: 32.8 % — ABNORMAL LOW (ref 36.0–46.0)
Hemoglobin: 10.2 g/dL — ABNORMAL LOW (ref 12.0–15.0)
Immature Granulocytes: 1 %
Lymphocytes Relative: 11 %
Lymphs Abs: 0.9 K/uL (ref 0.7–4.0)
MCH: 25.7 pg — ABNORMAL LOW (ref 26.0–34.0)
MCHC: 31.1 g/dL (ref 30.0–36.0)
MCV: 82.6 fL (ref 80.0–100.0)
Monocytes Absolute: 0.6 K/uL (ref 0.1–1.0)
Monocytes Relative: 7 %
Neutro Abs: 6.6 K/uL (ref 1.7–7.7)
Neutrophils Relative %: 81 %
Platelets: 280 K/uL (ref 150–400)
RBC: 3.97 MIL/uL (ref 3.87–5.11)
RDW: 14.6 % (ref 11.5–15.5)
WBC: 8.2 K/uL (ref 4.0–10.5)
nRBC: 0 % (ref 0.0–0.2)

## 2020-12-31 LAB — LIPASE, BLOOD: Lipase: 37 U/L (ref 11–51)

## 2020-12-31 LAB — RESP PANEL BY RT-PCR (FLU A&B, COVID) ARPGX2
Influenza A by PCR: NEGATIVE
Influenza B by PCR: NEGATIVE
SARS Coronavirus 2 by RT PCR: NEGATIVE

## 2020-12-31 MED ORDER — SODIUM CHLORIDE 0.9 % IV BOLUS (SEPSIS)
500.0000 mL | Freq: Once | INTRAVENOUS | Status: AC
  Administered 2020-12-31: 500 mL via INTRAVENOUS

## 2020-12-31 MED ORDER — AMLODIPINE BESYLATE 10 MG PO TABS
10.0000 mg | ORAL_TABLET | Freq: Every day | ORAL | Status: DC
  Administered 2021-01-01 – 2021-01-12 (×11): 10 mg via ORAL
  Filled 2020-12-31 (×7): qty 1
  Filled 2020-12-31: qty 2
  Filled 2020-12-31 (×3): qty 1

## 2020-12-31 MED ORDER — HYDRALAZINE HCL 50 MG PO TABS
50.0000 mg | ORAL_TABLET | Freq: Three times a day (TID) | ORAL | Status: DC
  Administered 2021-01-01 – 2021-01-09 (×20): 50 mg via ORAL
  Filled 2020-12-31 (×6): qty 1
  Filled 2020-12-31: qty 2
  Filled 2020-12-31 (×5): qty 1
  Filled 2020-12-31: qty 2
  Filled 2020-12-31 (×9): qty 1

## 2020-12-31 MED ORDER — SODIUM CHLORIDE 0.9 % IV SOLN
2.0000 g | INTRAVENOUS | Status: DC
  Administered 2021-01-01 – 2021-01-07 (×7): 2 g via INTRAVENOUS
  Filled 2020-12-31: qty 2
  Filled 2020-12-31 (×5): qty 20
  Filled 2020-12-31: qty 2

## 2020-12-31 MED ORDER — LABETALOL HCL 5 MG/ML IV SOLN
20.0000 mg | Freq: Once | INTRAVENOUS | Status: AC
  Administered 2020-12-31: 20 mg via INTRAVENOUS
  Filled 2020-12-31: qty 4

## 2020-12-31 MED ORDER — ONDANSETRON 4 MG PO TBDP
4.0000 mg | ORAL_TABLET | Freq: Once | ORAL | Status: AC
  Administered 2020-12-31: 4 mg via ORAL
  Filled 2020-12-31: qty 1

## 2020-12-31 MED ORDER — MORPHINE SULFATE (PF) 4 MG/ML IV SOLN
4.0000 mg | Freq: Once | INTRAVENOUS | Status: AC
Start: 2020-12-31 — End: 2020-12-31
  Administered 2020-12-31: 4 mg via INTRAVENOUS
  Filled 2020-12-31: qty 1

## 2020-12-31 MED ORDER — ACETAMINOPHEN 650 MG RE SUPP: 650.0000 mg | Freq: Four times a day (QID) | RECTAL | Status: DC | PRN

## 2020-12-31 MED ORDER — SODIUM CHLORIDE 0.9 % IV SOLN
1.0000 g | Freq: Once | INTRAVENOUS | Status: AC
  Administered 2020-12-31: 1 g via INTRAVENOUS
  Filled 2020-12-31: qty 10

## 2020-12-31 MED ORDER — INSULIN ASPART 100 UNIT/ML IJ SOLN
0.0000 [IU] | Freq: Three times a day (TID) | INTRAMUSCULAR | Status: DC
  Administered 2021-01-01 – 2021-01-09 (×6): 1 [IU] via SUBCUTANEOUS
  Filled 2020-12-31: qty 0.06

## 2020-12-31 MED ORDER — ACETAMINOPHEN 325 MG PO TABS
650.0000 mg | ORAL_TABLET | Freq: Four times a day (QID) | ORAL | Status: DC | PRN
  Administered 2021-01-01: 650 mg via ORAL
  Filled 2020-12-31: qty 2

## 2020-12-31 MED ORDER — LABETALOL HCL 5 MG/ML IV SOLN
20.0000 mg | Freq: Once | INTRAVENOUS | Status: AC
  Administered 2020-12-31: 15 mg via INTRAVENOUS
  Filled 2020-12-31: qty 4

## 2020-12-31 MED ORDER — BUPROPION HCL ER (XL) 150 MG PO TB24
150.0000 mg | ORAL_TABLET | Freq: Every day | ORAL | Status: DC
  Administered 2021-01-01 – 2021-01-12 (×11): 150 mg via ORAL
  Filled 2020-12-31 (×11): qty 1

## 2020-12-31 MED ORDER — SODIUM BICARBONATE 650 MG PO TABS
650.0000 mg | ORAL_TABLET | Freq: Two times a day (BID) | ORAL | Status: DC
  Administered 2021-01-01 – 2021-01-06 (×11): 650 mg via ORAL
  Filled 2020-12-31 (×11): qty 1

## 2020-12-31 MED ORDER — ONDANSETRON HCL 4 MG/2ML IJ SOLN
4.0000 mg | Freq: Once | INTRAMUSCULAR | Status: AC
  Administered 2020-12-31: 4 mg via INTRAVENOUS
  Filled 2020-12-31: qty 2

## 2020-12-31 MED ORDER — METOPROLOL SUCCINATE ER 25 MG PO TB24
25.0000 mg | ORAL_TABLET | Freq: Every day | ORAL | Status: DC
  Administered 2021-01-01 – 2021-01-12 (×11): 25 mg via ORAL
  Filled 2020-12-31 (×11): qty 1

## 2020-12-31 MED ORDER — HEPARIN SODIUM (PORCINE) 5000 UNIT/ML IJ SOLN
5000.0000 [IU] | Freq: Three times a day (TID) | INTRAMUSCULAR | Status: DC
  Administered 2021-01-01 – 2021-01-12 (×29): 5000 [IU] via SUBCUTANEOUS
  Filled 2020-12-31 (×30): qty 1

## 2020-12-31 MED ORDER — SODIUM CHLORIDE 0.9 % IV SOLN: INTRAVENOUS | Status: AC

## 2020-12-31 MED ORDER — ATORVASTATIN CALCIUM 10 MG PO TABS
20.0000 mg | ORAL_TABLET | Freq: Every day | ORAL | Status: DC
  Administered 2021-01-01 – 2021-01-12 (×11): 20 mg via ORAL
  Filled 2020-12-31 (×5): qty 2
  Filled 2020-12-31: qty 1
  Filled 2020-12-31 (×5): qty 2

## 2020-12-31 MED ORDER — SODIUM CHLORIDE 0.9 % IV SOLN
1000.0000 mL | INTRAVENOUS | Status: DC
  Administered 2020-12-31: 1000 mL via INTRAVENOUS

## 2020-12-31 MED ORDER — CYCLOBENZAPRINE HCL 5 MG PO TABS
5.0000 mg | ORAL_TABLET | Freq: Every day | ORAL | Status: DC | PRN
  Administered 2021-01-01: 5 mg via ORAL
  Filled 2020-12-31: qty 1

## 2020-12-31 MED ORDER — AMITRIPTYLINE HCL 50 MG PO TABS
100.0000 mg | ORAL_TABLET | Freq: Every day | ORAL | Status: DC
  Administered 2021-01-01 – 2021-01-11 (×12): 100 mg via ORAL
  Filled 2020-12-31: qty 4
  Filled 2020-12-31 (×7): qty 2
  Filled 2020-12-31: qty 4
  Filled 2020-12-31: qty 2
  Filled 2020-12-31: qty 4
  Filled 2020-12-31: qty 2

## 2020-12-31 NOTE — H&P (Signed)
History and Physical    [name] [name] FGB:[id number] DOB: Mar 04, 1976 DOA: 12/31/2020  PCP: Riesa Pope, MD  Patient coming from: Home.  Chief Complaint: Left flank pain and nausea vomiting.  HPI: [name] [name] is a 45 y.o. female with history of chronic kidney disease stage IV, hypertension, diabetes mellitus, tobacco abuse, anemia, CHF who presented to the ER with complaints of having persistent nausea vomiting for the last 24 hours with left flank pain.  Denies any chest pain shortness of breath diarrhea fever chills.  Denies any dysuria.  ED Course: In the ER labs show worsening creatinine from 4.29 in May 2022 it is around 6.26 now.  Bicarb of 19.  UA shows features concerning for UTI.  CT abdomen pelvis done without contrast shows features concerning for cystitis.  Gallbladder with stones with no features of cholecystitis.  COVID test was negative.  Patient was given fluid bolus in the ER for acute on chronic kidney disease and ceftriaxone for possible pyelonephritis.  Review of Systems: As per HPI, rest all negative.   Past Medical History:  Diagnosis Date   Abdominal muscle pain 09/08/2016   Abnormal Pap smear of cervix 2009   Abscess    history of multiple abscesses   Acute bilateral low back pain 02/13/2017   Acute blood loss anemia    Acute on chronic renal failure ([place]) 07/12/2012   Acute renal failure ([place]) 07/12/2012   Adjustment disorder with depressed mood 03/26/2017   AKI (acute kidney injury) ([place])    Anemia 10/21/2019   Anemia of chronic disease 2002   Anxiety    Panic attacks   Bilateral lower extremity edema 05/13/2016   Bipolar disorder ([place])    Cataract    B/L cataract   Cellulitis 05/21/2014   right eye   Chronic bronchitis ([place])    "get it q yr" (05/13/2013)   Chronic pain    Chronic pain of right knee 09/08/2016   Dehiscence of amputation stump ([place])    Depression    Edema of lower extremity    Endocarditis 2002   subacute  bacterial endocarditis.    Family history of anesthesia complication    "my mom has a hard time coming out from under"   Fibromyalgia    GERD (gastroesophageal reflux disease)    occ   Heart murmur    Herpes simplex type 1 infection 01/16/2018   History of blood transfusion    "just low blood count" (05/13/2013)   Hyperlipidemia    Hypertension    Hypothyroidism    Hypothyroidism, adult 03/21/2014   Hypoxia    Leukocytosis    Necrosis (HCC)    and ulceration   Necrotizing fasciitis s/p OR debridements 07/06/2012   Obesity    OSA on CPAP    does not wear all the time   Peripheral neuropathy    Pneumonia    Severe protein-calorie malnutrition (HCC)    Type II diabetes mellitus ([place])    Type  II   Wound dehiscence 09/04/2019    Past Surgical History:  Procedure Laterality Date   ABDOMINAL AORTOGRAM W/LOWER EXTREMITY Left 03/19/2019   Procedure: ABDOMINAL AORTOGRAM W/LOWER EXTREMITY;  Surgeon: Serafina Mitchell, MD;  Location: [facility];  Service: Cardiovascular;  Laterality: Left;   ABOVE KNEE LEG AMPUTATION Left 10/11/2019   wound dehisence    AIR/FLUID EXCHANGE Right 12/31/2019   Procedure: AIR/FLUID EXCHANGE;  Surgeon: Jalene Mullet, MD;  Location: [facility];  Service: Ophthalmology;  Laterality: Right;   AMPUTATION Left 04/07/2017   Procedure: LEFT BELOW KNEE AMPUTATION;  Surgeon: Newt Minion, MD;  Location: [facility];  Service: Orthopedics;  Laterality: Left;   AMPUTATION Left 10/11/2019   Procedure: LEFT ABOVE KNEE AMPUTATION;  Surgeon: Newt Minion, MD;  Location: [facility];  Service: Orthopedics;  Laterality: Left;   CATARACT EXTRACTION W/PHACO Right 02/11/2020   Procedure: CATARACT EXTRACTION PHACO AND INTRAOCULAR LENS PLACEMENT (IOC);  Surgeon: Jalene Mullet, MD;  Location: [facility];  Service: Ophthalmology;  Laterality: Right;   CORONARY ARTERY BYPASS GRAFT     EYE SURGERY     lazer   GAS INSERTION Right 12/31/2019   Procedure: INSERTION OF GAS;  Surgeon: Jalene Mullet, MD;  Location: [facility];  Service: Ophthalmology;  Laterality: Right;   INCISION AND DRAINAGE ABSCESS     multiple I&Ds   INCISION AND DRAINAGE ABSCESS Left 07/09/2012   Procedure: DRESSING CHANGE, THIGH WOUND;  Surgeon: Harl Bowie, MD;  Location: [facility];  Service: General;  Laterality: Left;   INCISION AND DRAINAGE OF WOUND Left 07/07/2012   Procedure: IRRIGATION AND DEBRIDEMENT WOUND;  Surgeon: Harl Bowie, MD;  Location: [facility];  Service: General;  Laterality: Left;   INCISION AND DRAINAGE PERIRECTAL ABSCESS Left 07/14/2012   Procedure: DEBRIDEMENT OF SKIN & SOFT TISSUE; DRESSING CHANGE UNDER ANESTHESIA;  Surgeon: Gayland Curry, MD,FACS;  Location: [facility];  Service: General;  Laterality: Left;   INCISION AND DRAINAGE PERIRECTAL ABSCESS Left 07/16/2012   Procedure: I&D Left Thigh;  Surgeon: Gwenyth Ober, MD;  Location: [facility];  Service: General;  Laterality: Left;   INCISION AND DRAINAGE PERIRECTAL ABSCESS N/A 01/05/2015   Procedure: IRRIGATION AND DEBRIDEMENT PERIRECTAL ABSCESS;  Surgeon: Donnie Mesa, MD;  Location: [facility];  Service: General;  Laterality: N/A;   IRRIGATION AND DEBRIDEMENT ABSCESS Left 07/06/2012   Procedure: IRRIGATION AND DEBRIDEMENT ABSCESS BUTTOCKS AND THIGH;  Surgeon: Shann Medal, MD;  Location: [facility];  Service: General;  Laterality: Left;   IRRIGATION AND DEBRIDEMENT ABSCESS Left 08/10/2012   Procedure: IRRIGATION AND DEBRIDEMENT ABSCESS;  Surgeon: Madilyn Hook, DO;  Location: [facility];  Service: General;  Laterality: Left;   PARS PLANA VITRECTOMY Right 12/31/2019   Procedure: PARS PLANA VITRECTOMY WITH 25 GAUGE;  Surgeon: Jalene Mullet, MD;  Location: [facility];  Service: Ophthalmology;  Laterality: Right;   PHOTOCOAGULATION WITH LASER Right 12/31/2019   Procedure: PHOTOCOAGULATION WITH LASER;  Surgeon: Jalene Mullet, MD;  Location: [facility];  Service: Ophthalmology;  Laterality: Right;   REPAIR OF COMPLEX TRACTION RETINAL DETACHMENT Right 12/31/2019    Procedure: REPAIR OF COMPLEX TRACTION RETINAL;  Surgeon: Jalene Mullet, MD;  Location: [facility];  Service: Ophthalmology;  Laterality: Right;   STUMP REVISION Left 06/09/2017   Procedure: REVISION LEFT BELOW KNEE AMPUTATION;  Surgeon: Newt Minion, MD;  Location: [facility];  Service: Orthopedics;  Laterality: Left;   STUMP REVISION Left 09/04/2019   Procedure: LEFT BELOW KNEE AMPUTATION REVISION;  Surgeon: Newt Minion, MD;  Location: [facility];  Service: Orthopedics;  Laterality: Left;   STUMP REVISION Left 09/20/2019   Procedure: LEFT BELOW KNEE AMPUTATION REVISION;  Surgeon: Newt Minion, MD;  Location: [facility];  Service: Orthopedics;  Laterality: Left;   TONSILLECTOMY  1994     reports that she has been smoking cigarettes. She has a 8.00 pack-year smoking history. She has never used smokeless tobacco. She reports current alcohol use. She reports that she does not use  drugs.  Allergies  Allergen Reactions   Vancomycin Other (See Comments)    Acute renal failure suspected secondary to vanco    Family History  Problem Relation Age of Onset   Heart failure Mother    Diabetes Mother    Kidney disease Mother    Kidney disease Father    Diabetes Father    Diabetes Paternal Grandmother    Heart failure Paternal Grandmother    Other Other     Prior to Admission medications   Medication Sig Start Date End Date Taking? Authorizing Provider  hydrALAZINE (APRESOLINE) 50 MG tablet TAKE 1 TABLET BY MOUTH 3 TIMES DAILY 10/09/20   Jose Persia, MD  allopurinol (ZYLOPRIM) 100 MG tablet Take 0.5 tablets (50 mg total) by mouth 2 (two) times a week. 10/05/20   Katsadouros, Vasilios, MD  amitriptyline (ELAVIL) 100 MG tablet Take 1 tablet (100 mg total) by mouth at bedtime. 02/13/20   Sanjuan Dame, MD  amLODipine (NORVASC) 10 MG tablet Take 1 tablet (10 mg total) by mouth daily. 10/07/19   Lars Mage, MD  amoxicillin-clavulanate (AUGMENTIN) 875-125 MG tablet Take 1 tablet by mouth 2 (two) times  daily. 11/25/20   Suzan Slick, NP  atorvastatin (LIPITOR) 20 MG tablet Take 1 tablet (20 mg total) by mouth daily. 06/30/20   Jose Persia, MD  blood glucose meter kit and supplies KIT ICD 10- E11.69. Based on patient and insurance preference. Use up to four times daily as directed. 05/01/18   Love, Ivan Anchors, PA-C  bumetanide (BUMEX) 2 MG tablet Take 1 tablet (2 mg total) by mouth 2 (two) times daily. 12/16/20   Riesa Pope, MD  buPROPion (WELLBUTRIN XL) 150 MG 24 hr tablet Take 1 tablet (150 mg total) by mouth every morning. 09/10/20 09/10/21  Harvie Heck, MD  cyclobenzaprine (FLEXERIL) 5 MG tablet Please take one tab as needed for muscle spasms. 12/15/20   Riesa Pope, MD  diclofenac Sodium (VOLTAREN) 1 % GEL Apply 1 application topically 4 (four) times daily as needed (pain). 12/14/20   Riesa Pope, MD  metoprolol succinate (TOPROL-XL) 25 MG 24 hr tablet TAKE 1 TABLET BY MOUTH EVERY DAY 10/13/20   Riesa Pope, MD  mupirocin ointment (BACTROBAN) 2 % apply TO open SKIN daily AS NEEDED 12/10/20   Katsadouros, Vasilios, MD  oxyCODONE-acetaminophen (PERCOCET/ROXICET) 5-325 MG tablet TAKE 1 OR 2 TABLETS BY MOUTH EVERY DAY AS NEEDED FOR SEVERE PAIN 12/08/20   Riesa Pope, MD  pregabalin (LYRICA) 50 MG capsule Take 1 capsule (50 mg total) by mouth 2 (two) times daily. 12/09/19 12/08/20  [name], MD  sodium bicarbonate 650 MG tablet Take 1 tablet (650 mg total) by mouth 2 (two) times daily. 09/26/19   Asencion Noble, MD  sodium zirconium cyclosilicate (LOKELMA) 10 g PACK packet Take 10 g by mouth 2 (two) times daily. 09/26/19   Asencion Noble, MD  TRUEPLUS PEN NEEDLES 31G X 5 MM MISC Use pen needle with insulin 3 times daily Patient taking differently: 1 each by Other route 3 (three) times daily. 08/12/19   Bartholomew Crews, MD  VICTOZA 18 MG/3ML SOPN Inject 1.8 mg into the skin at bedtime. 10/13/20   Riesa Pope, MD    Physical  Exam: Constitutional: Moderately built and nourished. Vitals:   12/31/20 2300 12/31/20 2305 12/31/20 2315 12/31/20 2330  BP: (!) 189/121 (!) 146/83 (!) 141/94 (!) 172/99  Pulse: 94 92 88 80  Resp: _0 Temp:  TempSrc:      SpO2: 98% 99% 98% 97%   Eyes: Anicteric no pallor. ENMT: No discharge from the ears eyes nose and mouth. Neck: No mass felt.  No neck rigidity.  No JVD appreciated. Respiratory: No rhonchi or crepitations. Cardiovascular: S1-S2 heard. Abdomen: Left flank tenderness.  No guarding or rigidity. Musculoskeletal: Right lower extremity has edema.  Left AKA. Skin: No rash. Neurologic: Alert awake oriented to time place and person.  Moves all extremities. Psychiatric: Appears normal.  Normal affect.   Labs on Admission: I have personally reviewed following labs and imaging studies  CBC: Recent Labs  Lab 12/31/20 1601  WBC 8.2  NEUTROABS 6.6  HGB 10.2*  HCT 32.8*  MCV 82.6  PLT 130   Basic Metabolic Panel: Recent Labs  Lab 12/31/20 1601  NA 142  K 5.3*  CL 112*  CO2 19*  GLUCOSE 157*  BUN 68*  CREATININE 6.26*  CALCIUM 8.9   GFR: CrCl cannot be calculated (Unknown ideal weight.). Liver Function Tests: Recent Labs  Lab 12/31/20 1601  AST 10*  ALT 9  ALKPHOS 75  BILITOT 0.5  PROT 7.6  ALBUMIN 3.4*   Recent Labs  Lab 12/31/20 1601  LIPASE 37   No results for input(s): AMMONIA in the last 168 hours. Coagulation Profile: No results for input(s): INR, PROTIME in the last 168 hours. Cardiac Enzymes: No results for input(s): CKTOTAL, CKMB, CKMBINDEX, TROPONINI in the last 168 hours. BNP (last 3 results) No results for input(s): PROBNP in the last 8760 hours. HbA1C: No results for input(s): HGBA1C in the last 72 hours. CBG: No results for input(s): GLUCAP in the last 168 hours. Lipid Profile: No results for input(s): CHOL, HDL, LDLCALC, TRIG, CHOLHDL, LDLDIRECT in the last 72 hours. Thyroid Function Tests: No results for  input(s): TSH, T4TOTAL, FREET4, T3FREE, THYROIDAB in the last 72 hours. Anemia Panel: No results for input(s): VITAMINB12, FOLATE, FERRITIN, TIBC, IRON, RETICCTPCT in the last 72 hours. Urine analysis:    Component Value Date/Time   COLORURINE YELLOW 12/31/2020 1936   APPEARANCEUR HAZY (A) 12/31/2020 1936   APPEARANCEUR Clear 09/10/2020 1150   LABSPEC 1.016 12/31/2020 1936   PHURINE 6.0 12/31/2020 1936   GLUCOSEU 150 (A) 12/31/2020 1936   GLUCOSEU >=1000 (A) 03/16/2016 1038   HGBUR SMALL (A) 12/31/2020 1936   BILIRUBINUR NEGATIVE 12/31/2020 1936   BILIRUBINUR Negative 09/10/2020 1150   KETONESUR 5 (A) 12/31/2020 1936   PROTEINUR >=300 (A) 12/31/2020 1936   UROBILINOGEN 0.2 01/10/2019 1556   NITRITE NEGATIVE 12/31/2020 1936   LEUKOCYTESUR SMALL (A) 12/31/2020 1936   Sepsis Labs: _0 (procalcitonin:4,lacticidven:4) ) Recent Results (from the past 240 hour(s))  Resp Panel by RT-PCR (Flu A&B, Covid) Nasopharyngeal Swab     Status: None   Collection Time: 12/31/20  6:24 PM   Specimen: Nasopharyngeal Swab; Nasopharyngeal(NP) swabs in vial transport medium  Result Value Ref Range Status   SARS Coronavirus 2 by RT PCR NEGATIVE NEGATIVE Final    Comment: (NOTE) SARS-CoV-2 target nucleic acids are NOT DETECTED.  The SARS-CoV-2 RNA is generally detectable in upper respiratory specimens during the acute phase of infection. The lowest concentration of SARS-CoV-2 viral copies this assay can detect is 138 copies/mL. A negative result does not preclude SARS-Cov-2 infection and should not be used as the sole basis for treatment or other patient management decisions. A negative result may occur with  improper specimen collection/handling, submission of specimen other than nasopharyngeal swab, presence of viral mutation(s) within the areas targeted  by this assay, and inadequate number of viral copies(<138 copies/mL). A negative result must be combined with clinical observations,  patient history, and epidemiological information. The expected result is Negative.  Fact Sheet for Patients:  EntrepreneurPulse.com.au  Fact Sheet for Healthcare Providers:  IncredibleEmployment.be  This test is no t yet approved or cleared by the [place] FDA and  has been authorized for detection and/or diagnosis of SARS-CoV-2 by FDA under an Emergency Use Authorization (EUA). This EUA will remain  in effect (meaning this test can be used) for the duration of the COVID-19 declaration under Section 564(b)(1) of the Act, 21 U.S.C.section 360bbb-3(b)(1), unless the authorization is terminated  or revoked sooner.       Influenza A by PCR NEGATIVE NEGATIVE Final   Influenza B by PCR NEGATIVE NEGATIVE Final    Comment: (NOTE) The Xpert Xpress SARS-CoV-2/FLU/RSV plus assay is intended as an aid in the diagnosis of influenza from Nasopharyngeal swab specimens and should not be used as a sole basis for treatment. Nasal washings and aspirates are unacceptable for Xpert Xpress SARS-CoV-2/FLU/RSV testing.  Fact Sheet for Patients: EntrepreneurPulse.com.au  Fact Sheet for Healthcare Providers: IncredibleEmployment.be  This test is not yet approved or cleared by the [place] FDA and has been authorized for detection and/or diagnosis of SARS-CoV-2 by FDA under an Emergency Use Authorization (EUA). This EUA will remain in effect (meaning this test can be used) for the duration of the COVID-19 declaration under Section 564(b)(1) of the Act, 21 U.S.C. section 360bbb-3(b)(1), unless the authorization is terminated or revoked.  Performed at [facility], [place] [address], [place], [place] [address]      Radiological Exams on Admission: CT ABDOMEN PELVIS WO CONTRAST  Result Date: 12/31/2020 CLINICAL DATA:  Abdominal pain, diffuse EXAM: CT ABDOMEN AND PELVIS WITHOUT CONTRAST TECHNIQUE:  Multidetector CT imaging of the abdomen and pelvis was performed following the standard protocol without IV contrast. COMPARISON:  October 27, 2019 FINDINGS: Lower chest: Ground-glass opacities with some interlobular septal thickening and micro nodularity in the lung bases. Hepatobiliary: Unremarkable noncontrast appearance of the hepatic parenchyma. Cholelithiasis without evidence of acute cholecystitis. No biliary ductal dilation. Pancreas: Unremarkable noncontrast appearance of the pancreatic parenchyma. No pancreatic ductal dilation. Spleen: Within normal limits. Adrenals/Urinary Tract: Bilateral adrenal glands are unremarkable. No hydronephrosis. 8 mm left lower pole angiomyolipoma. No renal, ureteral or bladder calculus visualized. Diffuse thickening of the urinary bladder wall. Stomach/Bowel: Stomach is unremarkable. No pathologic dilation of small bowel. Terminal ileum appears normal. Appendix appears normal. There is mild fatty replacement of the ascending and transverse colonic wall. Vascular/Lymphatic: No abdominal aortic aneurysm. Prominent retroperitoneal lymph nodes measuring up to 7 mm at the left common iliac on image 52/2 with unchanged size of this node since May 29, 2019, favoring a benign reactive etiology. Reproductive: Uterus and bilateral adnexa are unremarkable. Other: Mild diffuse nonspecific subcutaneous edema. Misty appearance of the mesentery and retroperitoneum. No walled off fluid collections or significant free fluid. Musculoskeletal: Multilevel degenerative changes spine. No acute osseous abnormality. IMPRESSION: 1. Diffuse thickening of the urinary bladder wall. Correlate with urinalysis to exclude cystitis. 2. Ground-glass opacities with some interlobular septal thickening and micro nodularity in the lung bases. Findings likely reflect an infectious or inflammatory process. 3. Cholelithiasis without evidence of acute cholecystitis. 4. Prominent retroperitoneal lymph nodes measuring  up to 7 mm with unchanged size of this node since May 29, 2019, favoring a benign reactive etiology. Electronically Signed   By: Dahlia Bailiff MD  On: 12/31/2020 19:22      Assessment/Plan Principal Problem:   ARF (acute renal failure) (HCC) Active Problems:   Controlled type 2 diabetes mellitus (HCC)   OSA (obstructive sleep apnea)   Pyelonephritis   Hypertensive urgency    Acute on chronic kidney disease stage IV likely precipitated by nausea and vomiting.  Patient does have significant lower extremity edema at this time.  However since patient's worsening could have been attributed to vomiting and dehydration we will gently hydrate hold patient's lisinopril and Bumex and follow metabolic panel intake output.  May need nephrology input if creatinine does not improve. Left flank pain with nausea vomiting could be from pyelonephritis.  Presently on ceftriaxone.  Follow urine cultures.  Nausea vomiting could also be from uremia.  See #1. Hypertensive urgency we will keep patient on home medications including beta-blockers hydralazine amlodipine.  Hold lisinopril and Bumex due to worsening renal function.  As needed IV hydralazine.  Follow blood pressure trends. Diabetes mellitus type 2 we will keep patient on sliding scale coverage. Anemia likely from renal disease hemoglobin appears to be stable.  Follow CBC.   DVT prophylaxis: Heparin. Code Status: Full code. Family Communication: Discussed with patient. Disposition Plan: Home. Consults called: None. Admission status: Observation.   Rise Patience MD [facility] Pager [phone].  If 7PM-7AM, please contact night-coverage [email or web address] Password [facility]  12/31/2020, 11:42 PM

## 2020-12-31 NOTE — ED Notes (Signed)
Onlty 15mg  of Labetalol administered. Pt BP 146/83 after 15 mg

## 2020-12-31 NOTE — ED Provider Notes (Signed)
[facility] DEPT Provider Note   CSN: [id number] Arrival date & time: 12/31/20  1451     History Chief Complaint  Patient presents with   Fever    [name] [name] is a 45 y.o. female.   Fever Associated symptoms: no chest pain    Patient presents to the ED for evaluation of abdominal pain nausea and vomiting.  Patient states symptoms started couple days ago.  She has had persistent nausea and vomiting.  Patient states she has not been able to keep down any of her medications.  She started to feel weak and dehydrated.  She is having pain in her lower abdomen on both sides.  She has not had any diarrhea.  She did have a small bowel movement today.  She is having some urinary frequency.  She started to feel dehydrated.  Patient has not measured any fevers at home because she does not have a thermometer but she has felt feverish.  Past Medical History:  Diagnosis Date   Abdominal muscle pain 09/08/2016   Abnormal Pap smear of cervix 2009   Abscess    history of multiple abscesses   Acute bilateral low back pain 02/13/2017   Acute blood loss anemia    Acute on chronic renal failure ([place]) 07/12/2012   Acute renal failure ([place]) 07/12/2012   Adjustment disorder with depressed mood 03/26/2017   AKI (acute kidney injury) ([place])    Anemia 10/21/2019   Anemia of chronic disease 2002   Anxiety    Panic attacks   Bilateral lower extremity edema 05/13/2016   Bipolar disorder ([place])    Cataract    B/L cataract   Cellulitis 05/21/2014   right eye   Chronic bronchitis ([place])    "get it q yr" (05/13/2013)   Chronic pain    Chronic pain of right knee 09/08/2016   Dehiscence of amputation stump ([place])    Depression    Edema of lower extremity    Endocarditis 2002   subacute bacterial endocarditis.    Family history of anesthesia complication    "my mom has a hard time coming out from under"   Fibromyalgia    GERD (gastroesophageal reflux disease)    occ    Heart murmur    Herpes simplex type 1 infection 01/16/2018   History of blood transfusion    "just low blood count" (05/13/2013)   Hyperlipidemia    Hypertension    Hypothyroidism    Hypothyroidism, adult 03/21/2014   Hypoxia    Leukocytosis    Necrosis ([place])    and ulceration   Necrotizing fasciitis s/p OR debridements 07/06/2012   Obesity    OSA on CPAP    does not wear all the time   Peripheral neuropathy    Pneumonia    Severe protein-calorie malnutrition (HCC)    Type II diabetes mellitus ([place])    Type  II   Wound dehiscence 09/04/2019    Patient Active Problem List   Diagnosis Date Noted   Muscle spasm 12/16/2020   Cough 05/26/2020   Chronic pain syndrome 01/16/2020   Hx of AKA (above knee amputation), left ([place])    Acute respiratory failure with hypoxia ([place]) 10/21/2019   Blurry vision, right eye 10/08/2019   Tobacco use 03/01/2019   Phantom limb pain ([place]) 11/08/2018   Current moderate episode of major depressive disorder (HCC)    Hyperkalemia    Anemia of chronic disease    Fall    Status  post below knee amputation, left ([place]) 04/11/2017   Bipolar affective disorder ([place])    Heart failure with preserved ejection fraction ([place]) 02/09/2017   Routine adult health maintenance 12/08/2016   Chronic kidney disease (CKD), stage III (moderate) ([place]) 07/15/2016   OSA (obstructive sleep apnea) 05/13/2016   Vaginal irritation 03/14/2016   Morbid obesity due to excess calories ([place]) 11/27/2015   Subclinical hypothyroidism 04/25/2013   Controlled type 2 diabetes mellitus ([place]) 07/12/2012   Carpal tunnel syndrome, bilateral 01/25/2012   Diabetic polyneuropathy associated with type 2 diabetes mellitus ([place]) 07/04/2011   Anxiety and depression 06/02/2011   GERD 12/06/2007   Hyperlipidemia 08/29/2007   Hypertension associated with diabetes ([place]) 08/29/2007    Past Surgical History:  Procedure Laterality Date   ABDOMINAL AORTOGRAM W/LOWER EXTREMITY Left 03/19/2019    Procedure: ABDOMINAL AORTOGRAM W/LOWER EXTREMITY;  Surgeon: Serafina Mitchell, MD;  Location: [facility];  Service: Cardiovascular;  Laterality: Left;   ABOVE KNEE LEG AMPUTATION Left 10/11/2019   wound dehisence    AIR/FLUID EXCHANGE Right 12/31/2019   Procedure: AIR/FLUID EXCHANGE;  Surgeon: Jalene Mullet, MD;  Location: [facility];  Service: Ophthalmology;  Laterality: Right;   AMPUTATION Left 04/07/2017   Procedure: LEFT BELOW KNEE AMPUTATION;  Surgeon: Newt Minion, MD;  Location: [facility];  Service: Orthopedics;  Laterality: Left;   AMPUTATION Left 10/11/2019   Procedure: LEFT ABOVE KNEE AMPUTATION;  Surgeon: Newt Minion, MD;  Location: [facility];  Service: Orthopedics;  Laterality: Left;   CATARACT EXTRACTION W/PHACO Right 02/11/2020   Procedure: CATARACT EXTRACTION PHACO AND INTRAOCULAR LENS PLACEMENT (IOC);  Surgeon: Jalene Mullet, MD;  Location: [facility];  Service: Ophthalmology;  Laterality: Right;   CORONARY ARTERY BYPASS GRAFT     EYE SURGERY     lazer   GAS INSERTION Right 12/31/2019   Procedure: INSERTION OF GAS;  Surgeon: Jalene Mullet, MD;  Location: [facility];  Service: Ophthalmology;  Laterality: Right;   INCISION AND DRAINAGE ABSCESS     multiple I&Ds   INCISION AND DRAINAGE ABSCESS Left 07/09/2012   Procedure: DRESSING CHANGE, THIGH WOUND;  Surgeon: Harl Bowie, MD;  Location: [facility];  Service: General;  Laterality: Left;   INCISION AND DRAINAGE OF WOUND Left 07/07/2012   Procedure: IRRIGATION AND DEBRIDEMENT WOUND;  Surgeon: Harl Bowie, MD;  Location: [facility];  Service: General;  Laterality: Left;   INCISION AND DRAINAGE PERIRECTAL ABSCESS Left 07/14/2012   Procedure: DEBRIDEMENT OF SKIN & SOFT TISSUE; DRESSING CHANGE UNDER ANESTHESIA;  Surgeon: Gayland Curry, MD,FACS;  Location: [facility];  Service: General;  Laterality: Left;   INCISION AND DRAINAGE PERIRECTAL ABSCESS Left 07/16/2012   Procedure: I&D Left Thigh;  Surgeon: Gwenyth Ober, MD;  Location: [facility];  Service:  General;  Laterality: Left;   INCISION AND DRAINAGE PERIRECTAL ABSCESS N/A 01/05/2015   Procedure: IRRIGATION AND DEBRIDEMENT PERIRECTAL ABSCESS;  Surgeon: Donnie Mesa, MD;  Location: [facility];  Service: General;  Laterality: N/A;   IRRIGATION AND DEBRIDEMENT ABSCESS Left 07/06/2012   Procedure: IRRIGATION AND DEBRIDEMENT ABSCESS BUTTOCKS AND THIGH;  Surgeon: Shann Medal, MD;  Location: [facility];  Service: General;  Laterality: Left;   IRRIGATION AND DEBRIDEMENT ABSCESS Left 08/10/2012   Procedure: IRRIGATION AND DEBRIDEMENT ABSCESS;  Surgeon: Madilyn Hook, DO;  Location: [facility];  Service: General;  Laterality: Left;   PARS PLANA VITRECTOMY Right 12/31/2019   Procedure: PARS PLANA VITRECTOMY WITH 25 GAUGE;  Surgeon: Jalene Mullet, MD;  Location[facility];  Service: Ophthalmology;  Laterality: Right;   PHOTOCOAGULATION WITH LASER Right 12/31/2019   Procedure: PHOTOCOAGULATION WITH LASER;  Surgeon: Jalene Mullet, MD;  Location: [facility];  Service: Ophthalmology;  Laterality: Right;   REPAIR OF COMPLEX TRACTION RETINAL DETACHMENT Right 12/31/2019   Procedure: REPAIR OF COMPLEX TRACTION RETINAL;  Surgeon: Jalene Mullet, MD;  Location: [facility];  Service: Ophthalmology;  Laterality: Right;   STUMP REVISION Left 06/09/2017   Procedure: REVISION LEFT BELOW KNEE AMPUTATION;  Surgeon: Newt Minion, MD;  Location: [facility];  Service: Orthopedics;  Laterality: Left;   STUMP REVISION Left 09/04/2019   Procedure: LEFT BELOW KNEE AMPUTATION REVISION;  Surgeon: Newt Minion, MD;  Location: [facility];  Service: Orthopedics;  Laterality: Left;   STUMP REVISION Left 09/20/2019   Procedure: LEFT BELOW KNEE AMPUTATION REVISION;  Surgeon: Newt Minion, MD;  Location: [facility];  Service: Orthopedics;  Laterality: Left;   TONSILLECTOMY  1994     OB History   No obstetric history on file.     Family History  Problem Relation Age of Onset   Heart failure Mother    Diabetes Mother    Kidney disease Mother    Kidney disease  Father    Diabetes Father    Diabetes Paternal Grandmother    Heart failure Paternal Grandmother    Other Other     Social History   Tobacco Use   Smoking status: Every Day    Packs/day: 0.50    Years: 16.00    Pack years: 8.00    Types: Cigarettes    Last attempt to quit: 07/22/2019    Years since quitting: 1.4   Smokeless tobacco: Never  Vaping Use   Vaping Use: Former   Devices: CBD oil  Substance Use Topics   Alcohol use: Yes    Alcohol/week: 0.0 standard drinks    Comment: social   Drug use: No    Home Medications Prior to Admission medications   Medication Sig Start Date End Date Taking? Authorizing Provider  hydrALAZINE (APRESOLINE) 50 MG tablet TAKE 1 TABLET BY MOUTH 3 TIMES DAILY 10/09/20   Jose Persia, MD  allopurinol (ZYLOPRIM) 100 MG tablet Take 0.5 tablets (50 mg total) by mouth 2 (two) times a week. 10/05/20   Katsadouros, Vasilios, MD  amitriptyline (ELAVIL) 100 MG tablet Take 1 tablet (100 mg total) by mouth at bedtime. 02/13/20   Sanjuan Dame, MD  amLODipine (NORVASC) 10 MG tablet Take 1 tablet (10 mg total) by mouth daily. 10/07/19   Lars Mage, MD  amoxicillin-clavulanate (AUGMENTIN) 875-125 MG tablet Take 1 tablet by mouth 2 (two) times daily. 11/25/20   Suzan Slick, NP  atorvastatin (LIPITOR) 20 MG tablet Take 1 tablet (20 mg total) by mouth daily. 06/30/20   Jose Persia, MD  blood glucose meter kit and supplies KIT ICD 10- E11.69. Based on patient and insurance preference. Use up to four times daily as directed. 05/01/18   Love, Ivan Anchors, PA-C  bumetanide (BUMEX) 2 MG tablet Take 1 tablet (2 mg total) by mouth 2 (two) times daily. 12/16/20   Riesa Pope, MD  buPROPion (WELLBUTRIN XL) 150 MG 24 hr tablet Take 1 tablet (150 mg total) by mouth every morning. 09/10/20 09/10/21  Harvie Heck, MD  cyclobenzaprine (FLEXERIL) 5 MG tablet Please take one tab as needed for muscle spasms. 12/15/20   Riesa Pope, MD  diclofenac Sodium  (VOLTAREN) 1 % GEL Apply 1 application topically 4 (four) times daily as  needed (pain). 12/14/20   Riesa Pope, MD  metoprolol succinate (TOPROL-XL) 25 MG 24 hr tablet TAKE 1 TABLET BY MOUTH EVERY DAY 10/13/20   Riesa Pope, MD  mupirocin ointment (BACTROBAN) 2 % apply TO open SKIN daily AS NEEDED 12/10/20   Katsadouros, Vasilios, MD  oxyCODONE-acetaminophen (PERCOCET/ROXICET) 5-325 MG tablet TAKE 1 OR 2 TABLETS BY MOUTH EVERY DAY AS NEEDED FOR SEVERE PAIN 12/08/20   Riesa Pope, MD  pregabalin (LYRICA) 50 MG capsule Take 1 capsule (50 mg total) by mouth 2 (two) times daily. 12/09/19 12/08/20  [name], MD  sodium bicarbonate 650 MG tablet Take 1 tablet (650 mg total) by mouth 2 (two) times daily. 09/26/19   Asencion Noble, MD  sodium zirconium cyclosilicate (LOKELMA) 10 g PACK packet Take 10 g by mouth 2 (two) times daily. 09/26/19   Asencion Noble, MD  TRUEPLUS PEN NEEDLES 31G X 5 MM MISC Use pen needle with insulin 3 times daily Patient taking differently: 1 each by Other route 3 (three) times daily. 08/12/19   Bartholomew Crews, MD  VICTOZA 18 MG/3ML SOPN Inject 1.8 mg into the skin at bedtime. 10/13/20   Riesa Pope, MD    Allergies    Vancomycin  Review of Systems   Review of Systems  Constitutional:  Positive for fever.  Respiratory:  Negative for shortness of breath.   Cardiovascular:  Negative for chest pain.  Gastrointestinal:  Positive for abdominal pain.  All other systems reviewed and are negative.  Physical Exam Updated Vital Signs BP (!) 199/109   Pulse 93   Temp 99.4 F (37.4 C) (Oral)   Resp 17   SpO2 98%   Physical Exam Vitals and nursing note reviewed.  Constitutional:      Appearance: She is well-developed. She is ill-appearing.  HENT:     Head: Normocephalic and atraumatic.     Right Ear: External ear normal.     Left Ear: External ear normal.  Eyes:     General: No scleral icterus.       Right eye: No  discharge.        Left eye: No discharge.     Conjunctiva/sclera: Conjunctivae normal.  Neck:     Trachea: No tracheal deviation.  Cardiovascular:     Rate and Rhythm: Normal rate and regular rhythm.  Pulmonary:     Effort: Pulmonary effort is normal. No respiratory distress.     Breath sounds: Normal breath sounds. No stridor. No wheezing or rales.  Abdominal:     General: Bowel sounds are normal. There is no distension.     Palpations: Abdomen is soft.     Tenderness: There is abdominal tenderness. There is no guarding or rebound.  Musculoskeletal:        General: No tenderness.     Cervical back: Neck supple.     Comments: Status post amputation left lower extremity  Skin:    General: Skin is warm and dry.     Findings: No rash.  Neurological:     General: No focal deficit present.     Mental Status: She is alert.     Cranial Nerves: No cranial nerve deficit (no facial droop, extraocular movements intact, no slurred speech).     Sensory: No sensory deficit.     Motor: No abnormal muscle tone or seizure activity.     Coordination: Coordination normal.  Psychiatric:        Mood and Affect: Mood normal.  ED Results / Procedures / Treatments   Labs (all labs ordered are listed, but only abnormal results are displayed) Labs Reviewed  CBC WITH DIFFERENTIAL/PLATELET - Abnormal; Notable for the following components:      Result Value   Hemoglobin 10.2 (*)    HCT 32.8 (*)    MCH 25.7 (*)    All other components within normal limits  COMPREHENSIVE METABOLIC PANEL - Abnormal; Notable for the following components:   Potassium 5.3 (*)    Chloride 112 (*)    CO2 19 (*)    Glucose, Bld 157 (*)    BUN 68 (*)    Creatinine, Ser 6.26 (*)    Albumin 3.4 (*)    AST 10 (*)    GFR, Estimated 8 (*)    All other components within normal limits  URINALYSIS, ROUTINE W REFLEX MICROSCOPIC - Abnormal; Notable for the following components:   APPearance HAZY (*)    Glucose, UA 150 (*)     Hgb urine dipstick SMALL (*)    Ketones, ur 5 (*)    Protein, ur >=300 (*)    Leukocytes,Ua SMALL (*)    WBC, UA >50 (*)    Bacteria, UA RARE (*)    All other components within normal limits  RESP PANEL BY RT-PCR (FLU A&B, COVID) ARPGX2  URINE CULTURE  LIPASE, BLOOD  I-STAT BETA HCG BLOOD, ED ([facility], [facility], [facility] ONLY)    EKG None  Radiology CT ABDOMEN PELVIS WO CONTRAST  Result Date: 12/31/2020 CLINICAL DATA:  Abdominal pain, diffuse EXAM: CT ABDOMEN AND PELVIS WITHOUT CONTRAST TECHNIQUE: Multidetector CT imaging of the abdomen and pelvis was performed following the standard protocol without IV contrast. COMPARISON:  October 27, 2019 FINDINGS: Lower chest: Ground-glass opacities with some interlobular septal thickening and micro nodularity in the lung bases. Hepatobiliary: Unremarkable noncontrast appearance of the hepatic parenchyma. Cholelithiasis without evidence of acute cholecystitis. No biliary ductal dilation. Pancreas: Unremarkable noncontrast appearance of the pancreatic parenchyma. No pancreatic ductal dilation. Spleen: Within normal limits. Adrenals/Urinary Tract: Bilateral adrenal glands are unremarkable. No hydronephrosis. 8 mm left lower pole angiomyolipoma. No renal, ureteral or bladder calculus visualized. Diffuse thickening of the urinary bladder wall. Stomach/Bowel: Stomach is unremarkable. No pathologic dilation of small bowel. Terminal ileum appears normal. Appendix appears normal. There is mild fatty replacement of the ascending and transverse colonic wall. Vascular/Lymphatic: No abdominal aortic aneurysm. Prominent retroperitoneal lymph nodes measuring up to 7 mm at the left common iliac on image 52/2 with unchanged size of this node since May 29, 2019, favoring a benign reactive etiology. Reproductive: Uterus and bilateral adnexa are unremarkable. Other: Mild diffuse nonspecific subcutaneous edema. Misty appearance of the mesentery and retroperitoneum. No walled off fluid  collections or significant free fluid. Musculoskeletal: Multilevel degenerative changes spine. No acute osseous abnormality. IMPRESSION: 1. Diffuse thickening of the urinary bladder wall. Correlate with urinalysis to exclude cystitis. 2. Ground-glass opacities with some interlobular septal thickening and micro nodularity in the lung bases. Findings likely reflect an infectious or inflammatory process. 3. Cholelithiasis without evidence of acute cholecystitis. 4. Prominent retroperitoneal lymph nodes measuring up to 7 mm with unchanged size of this node since May 29, 2019, favoring a benign reactive etiology. Electronically Signed   By: Dahlia Bailiff MD   On: 12/31/2020 19:22    Procedures .Critical Care  Date/Time: 12/31/2020 9:46 PM Performed by: Dorie Rank, MD Authorized by: Dorie Rank, MD   Critical care provider statement:    Critical care time (minutes):  56   Critical care was time spent personally by me on the following activities:  Discussions with consultants, evaluation of patient's response to treatment, examination of patient, ordering and performing treatments and interventions, ordering and review of laboratory studies, ordering and review of radiographic studies, pulse oximetry, re-evaluation of patient's condition, obtaining history from patient or surrogate and review of old charts   Medications Ordered in ED Medications  ondansetron (ZOFRAN-ODT) disintegrating tablet 4 mg (4 mg Oral Not Given 12/31/20 1841)  sodium chloride 0.9 % bolus 500 mL (0 mLs Intravenous Stopped 12/31/20 2106)    Followed by  0.9 %  sodium chloride infusion (1,000 mLs Intravenous New Bag/Given 12/31/20 1838)  morphine 4 MG/ML injection 4 mg (has no administration in time range)  labetalol (NORMODYNE) injection 20 mg (has no administration in time range)  ondansetron (ZOFRAN) injection 4 mg (4 mg Intravenous Given 12/31/20 1830)  morphine 4 MG/ML injection 4 mg (4 mg Intravenous Given 12/31/20 1830)   labetalol (NORMODYNE) injection 20 mg (20 mg Intravenous Given 12/31/20 1834)  cefTRIAXone (ROCEPHIN) 1 g in sodium chloride 0.9 % 100 mL IVPB (0 g Intravenous Stopped 12/31/20 2105)    ED Course  I have reviewed the triage vital signs and the nursing notes.  Pertinent labs & imaging results that were available during my care of the patient were reviewed by me and considered in my medical decision making (see chart for details).  Clinical Course as of 12/31/20 2146  Thu Dec 31, 2020  2002 Urinalysis does suggest UTI [JK]  2003 Hemoglobin is stable [JK]  2003 Creatinine is elevated compared to previous [JK]  2135 CT scan does not show any evidence of colitis or diverticulitis.  Patient does have evidence of bladder inflammation. [JK]    Clinical Course User Index [JK] Dorie Rank, MD   MDM Rules/Calculators/A&P                           Patient presented to the ED with complaints of vomiting abdominal pain.  Patient's ED work-up is notable for urinary tract infection.  This presentation is consistent with pyelonephritis.  Patient also has evidence of acute on chronic kidney injury.  I suspect this likely is related to her nausea vomiting and dehydration.  Patient CT scan does not show any evidence of obstruction.  Patient was treated with IV fluid and antiemetics.  He was also given pain medication is feeling somewhat better.  Patient was notably hypertensive as she has not been able to take her blood pressure medications.  She was given a dose of labetalol.  I will give an additional dose.  We will consult the medical service for admission further treatment Final Clinical Impression(s) / ED Diagnoses Final diagnoses:  Pyelonephritis  AKI (acute kidney injury) ([place])  Uncontrolled hypertension     Dorie Rank, MD 12/31/20 2146

## 2020-12-31 NOTE — ED Provider Notes (Signed)
Emergency Medicine Provider Triage Evaluation Note  HOLLI RENGEL , a 45 y.o. female  was evaluated in triage.  Pt complains of abd pain, nv, sweats and chills.  Review of Systems  Positive: Abd pain, nv, sweats and chills, urinary frequency Negative: Chest pain  Physical Exam  BP (!) 222/126 (BP Location: Left Arm)   Pulse (!) 106   Temp 99.4 F (37.4 C) (Oral)   Resp 20   SpO2 100%  Gen:   Awake, no distress   Resp:  Normal effort  MSK:   Moves extremities without difficulty  Other:  Llq abd ttp  Medical Decision Making  Medically screening exam initiated at 3:15 PM.  Appropriate orders placed.  Stephannie C Dimino was informed that the remainder of the evaluation will be completed by another provider, this initial triage assessment does not replace that evaluation, and the importance of remaining in the ED until their evaluation is complete.     Bishop Dublin 12/31/20 1515    Teressa Lower, MD 12/31/20 1806

## 2020-12-31 NOTE — ED Notes (Signed)
Refreshments provided

## 2020-12-31 NOTE — ED Triage Notes (Addendum)
Patient BIBA from home c/o chills, fever, n/v/d x2 days. Reports being unvaccinated against covid-19. Denies sick contacts. She also states she fell this morning d/t feeling extremely weak and dehydrated. Denies LOC.   4 mg zofran 20 G LAC

## 2021-01-01 ENCOUNTER — Ambulatory Visit: Payer: Medicare HMO

## 2021-01-01 DIAGNOSIS — G894 Chronic pain syndrome: Secondary | ICD-10-CM | POA: Diagnosis present

## 2021-01-01 DIAGNOSIS — F17210 Nicotine dependence, cigarettes, uncomplicated: Secondary | ICD-10-CM | POA: Diagnosis not present

## 2021-01-01 DIAGNOSIS — I160 Hypertensive urgency: Secondary | ICD-10-CM | POA: Diagnosis present

## 2021-01-01 DIAGNOSIS — N12 Tubulo-interstitial nephritis, not specified as acute or chronic: Secondary | ICD-10-CM | POA: Diagnosis not present

## 2021-01-01 DIAGNOSIS — N179 Acute kidney failure, unspecified: Secondary | ICD-10-CM | POA: Diagnosis present

## 2021-01-01 DIAGNOSIS — I129 Hypertensive chronic kidney disease with stage 1 through stage 4 chronic kidney disease, or unspecified chronic kidney disease: Secondary | ICD-10-CM | POA: Diagnosis not present

## 2021-01-01 DIAGNOSIS — E1122 Type 2 diabetes mellitus with diabetic chronic kidney disease: Secondary | ICD-10-CM | POA: Diagnosis present

## 2021-01-01 DIAGNOSIS — K219 Gastro-esophageal reflux disease without esophagitis: Secondary | ICD-10-CM | POA: Diagnosis present

## 2021-01-01 DIAGNOSIS — E785 Hyperlipidemia, unspecified: Secondary | ICD-10-CM | POA: Diagnosis present

## 2021-01-01 DIAGNOSIS — E860 Dehydration: Secondary | ICD-10-CM | POA: Diagnosis present

## 2021-01-01 DIAGNOSIS — E039 Hypothyroidism, unspecified: Secondary | ICD-10-CM | POA: Diagnosis present

## 2021-01-01 DIAGNOSIS — E1142 Type 2 diabetes mellitus with diabetic polyneuropathy: Secondary | ICD-10-CM | POA: Diagnosis present

## 2021-01-01 DIAGNOSIS — Z992 Dependence on renal dialysis: Secondary | ICD-10-CM | POA: Diagnosis not present

## 2021-01-01 DIAGNOSIS — E871 Hypo-osmolality and hyponatremia: Secondary | ICD-10-CM | POA: Diagnosis present

## 2021-01-01 DIAGNOSIS — G4733 Obstructive sleep apnea (adult) (pediatric): Secondary | ICD-10-CM | POA: Diagnosis present

## 2021-01-01 DIAGNOSIS — Z20822 Contact with and (suspected) exposure to covid-19: Secondary | ICD-10-CM | POA: Diagnosis present

## 2021-01-01 DIAGNOSIS — E875 Hyperkalemia: Secondary | ICD-10-CM | POA: Diagnosis present

## 2021-01-01 DIAGNOSIS — D631 Anemia in chronic kidney disease: Secondary | ICD-10-CM | POA: Diagnosis present

## 2021-01-01 DIAGNOSIS — D638 Anemia in other chronic diseases classified elsewhere: Secondary | ICD-10-CM | POA: Diagnosis present

## 2021-01-01 DIAGNOSIS — E872 Acidosis: Secondary | ICD-10-CM | POA: Diagnosis present

## 2021-01-01 DIAGNOSIS — I82622 Acute embolism and thrombosis of deep veins of left upper extremity: Secondary | ICD-10-CM | POA: Diagnosis not present

## 2021-01-01 DIAGNOSIS — N185 Chronic kidney disease, stage 5: Secondary | ICD-10-CM | POA: Diagnosis not present

## 2021-01-01 DIAGNOSIS — I120 Hypertensive chronic kidney disease with stage 5 chronic kidney disease or end stage renal disease: Secondary | ICD-10-CM | POA: Diagnosis present

## 2021-01-01 DIAGNOSIS — N184 Chronic kidney disease, stage 4 (severe): Secondary | ICD-10-CM | POA: Diagnosis not present

## 2021-01-01 DIAGNOSIS — N10 Acute tubulo-interstitial nephritis: Secondary | ICD-10-CM | POA: Diagnosis present

## 2021-01-01 DIAGNOSIS — M797 Fibromyalgia: Secondary | ICD-10-CM | POA: Diagnosis present

## 2021-01-01 DIAGNOSIS — N186 End stage renal disease: Secondary | ICD-10-CM | POA: Diagnosis present

## 2021-01-01 DIAGNOSIS — Z6841 Body Mass Index (BMI) 40.0 and over, adult: Secondary | ICD-10-CM | POA: Diagnosis not present

## 2021-01-01 DIAGNOSIS — F319 Bipolar disorder, unspecified: Secondary | ICD-10-CM | POA: Diagnosis present

## 2021-01-01 LAB — CBG MONITORING, ED
Glucose-Capillary: 104 mg/dL — ABNORMAL HIGH (ref 70–99)
Glucose-Capillary: 165 mg/dL — ABNORMAL HIGH (ref 70–99)

## 2021-01-01 LAB — CBC
HCT: 30 % — ABNORMAL LOW (ref 36.0–46.0)
HCT: 27.5 % — ABNORMAL LOW (ref 36.0–46.0)
Hemoglobin: 9.4 g/dL — ABNORMAL LOW (ref 12.0–15.0)
Hemoglobin: 8.3 g/dL — ABNORMAL LOW (ref 12.0–15.0)
MCH: 26.3 pg (ref 26.0–34.0)
MCH: 25.5 pg — ABNORMAL LOW (ref 26.0–34.0)
MCHC: 31.3 g/dL (ref 30.0–36.0)
MCHC: 30.2 g/dL (ref 30.0–36.0)
MCV: 84 fL (ref 80.0–100.0)
MCV: 84.6 fL (ref 80.0–100.0)
Platelets: 293 10*3/uL (ref 150–400)
Platelets: 210 10*3/uL (ref 150–400)
RBC: 3.57 MIL/uL — ABNORMAL LOW (ref 3.87–5.11)
RBC: 3.25 MIL/uL — ABNORMAL LOW (ref 3.87–5.11)
RDW: 14.6 % (ref 11.5–15.5)
RDW: 14.6 % (ref 11.5–15.5)
WBC: 8.9 10*3/uL (ref 4.0–10.5)
WBC: 6.7 10*3/uL (ref 4.0–10.5)
nRBC: 0 % (ref 0.0–0.2)
nRBC: 0 % (ref 0.0–0.2)

## 2021-01-01 LAB — COMPREHENSIVE METABOLIC PANEL
ALT: 9 U/L (ref 0–44)
AST: 10 U/L — ABNORMAL LOW (ref 15–41)
Albumin: 2.7 g/dL — ABNORMAL LOW (ref 3.5–5.0)
Alkaline Phosphatase: 60 U/L (ref 38–126)
Anion gap: 9 (ref 5–15)
BUN: 65 mg/dL — ABNORMAL HIGH (ref 6–20)
CO2: 19 mmol/L — ABNORMAL LOW (ref 22–32)
Calcium: 7.9 mg/dL — ABNORMAL LOW (ref 8.9–10.3)
Chloride: 110 mmol/L (ref 98–111)
Creatinine, Ser: 6.45 mg/dL — ABNORMAL HIGH (ref 0.44–1.00)
GFR, Estimated: 8 mL/min — ABNORMAL LOW (ref >60)
Glucose, Bld: 110 mg/dL — ABNORMAL HIGH (ref 70–99)
Potassium: 4.3 mmol/L (ref 3.5–5.1)
Sodium: 138 mmol/L (ref 135–145)
Total Bilirubin: 0.5 mg/dL (ref 0.3–1.2)
Total Protein: 6.1 g/dL — ABNORMAL LOW (ref 6.5–8.1)

## 2021-01-01 LAB — CK: Total CK: 101 U/L (ref 38–234)

## 2021-01-01 LAB — GLUCOSE, CAPILLARY
Glucose-Capillary: 138 mg/dL — ABNORMAL HIGH (ref 70–99)
Glucose-Capillary: 147 mg/dL — ABNORMAL HIGH (ref 70–99)

## 2021-01-01 LAB — CREATININE, SERUM
Creatinine, Ser: 6.58 mg/dL — ABNORMAL HIGH (ref 0.44–1.00)
GFR, Estimated: 7 mL/min — ABNORMAL LOW (ref >60)

## 2021-01-01 MED ORDER — OXYCODONE-ACETAMINOPHEN 5-325 MG PO TABS
1.0000 | ORAL_TABLET | Freq: Four times a day (QID) | ORAL | Status: DC | PRN
  Administered 2021-01-01 – 2021-01-04 (×7): 1 via ORAL
  Filled 2021-01-01 (×7): qty 1

## 2021-01-01 MED ORDER — LEVOTHYROXINE SODIUM 88 MCG PO TABS
88.0000 ug | ORAL_TABLET | Freq: Every day | ORAL | Status: DC
  Administered 2021-01-01 – 2021-01-12 (×12): 88 ug via ORAL
  Filled 2021-01-01 (×12): qty 1

## 2021-01-01 NOTE — Consult Note (Addendum)
Renal Service Consult Note [facility]  [name] [name] 01/01/2021 [name] Blazing, MD Requesting Physician: Dr. Lupita Leash  Reason for Consult: Renal failure  HPI: The patient is a 45 y.o. year-old w/ hx of DM2 on insulin, HTN, anemia, bipolar d/o, PAD sp L AKA who presented to ED w/ N/V for 1-2 days and flank pain. No SOB or CP. No f/c/s.  Creat was 6.26, up from 4.2 in May 2022. Askd to see for renal failure.    Pt is f/b [facility], states she hasn't been for office visit for about 1 year .  Office records show f/b Dr Marval Regal, last seen in April 2021 w/ CKD 3.    No SOB or orthopnea.  Had sickness w/ N/V about 2 mos ago, since then her appetite has been poor.  No regular nausea or emesis.  +fatigue.   Lives in [place] w/ her god-daughter, +tobacco.     ROS - denies CP, no joint pain, no HA, no blurry vision, no rash, no diarrhea, no nausea/ vomiting, no dysuria, no difficulty voiding   Past Medical History  Past Medical History:  Diagnosis Date   Abdominal muscle pain 09/08/2016   Abnormal Pap smear of cervix 2009   Abscess    history of multiple abscesses   Acute bilateral low back pain 02/13/2017   Acute blood loss anemia    Acute on chronic renal failure ([place]) 07/12/2012   Acute renal failure ([place]) 07/12/2012   Adjustment disorder with depressed mood 03/26/2017   AKI (acute kidney injury) ([place])    Anemia 10/21/2019   Anemia of chronic disease 2002   Anxiety    Panic attacks   Bilateral lower extremity edema 05/13/2016   Bipolar disorder ([place])    Cataract    B/L cataract   Cellulitis 05/21/2014   right eye   Chronic bronchitis ([place])    "get it q yr" (05/13/2013)   Chronic pain    Chronic pain of right knee 09/08/2016   Dehiscence of amputation stump (HCC)    Depression    Edema of lower extremity    Endocarditis 2002   subacute bacterial endocarditis.    Family history of anesthesia complication    "my mom has a hard time coming out from under"    Fibromyalgia    GERD (gastroesophageal reflux disease)    occ   Heart murmur    Herpes simplex type 1 infection 01/16/2018   History of blood transfusion    "just low blood count" (05/13/2013)   Hyperlipidemia    Hypertension    Hypothyroidism    Hypothyroidism, adult 03/21/2014   Hypoxia    Leukocytosis    Necrosis (HCC)    and ulceration   Necrotizing fasciitis s/p OR debridements 07/06/2012   Obesity    OSA on CPAP    does not wear all the time   Peripheral neuropathy    Pneumonia    Severe protein-calorie malnutrition (HCC)    Type II diabetes mellitus ([place])    Type  II   Wound dehiscence 09/04/2019   Past Surgical History  Past Surgical History:  Procedure Laterality Date   ABDOMINAL AORTOGRAM W/LOWER EXTREMITY Left 03/19/2019   Procedure: ABDOMINAL AORTOGRAM W/LOWER EXTREMITY;  Surgeon: Serafina Mitchell, MD;  Location: [place] [facility];  Service: Cardiovascular;  Laterality: Left;   ABOVE KNEE LEG AMPUTATION Left 10/11/2019   wound dehisence    AIR/FLUID EXCHANGE Right 12/31/2019   Procedure: AIR/FLUID EXCHANGE;  Surgeon: Posey Pronto,  Dareen Piano, MD;  Location: [place];  Service: Ophthalmology;  Laterality: Right;   AMPUTATION Left 04/07/2017   Procedure: LEFT BELOW KNEE AMPUTATION;  Surgeon: Newt Minion, MD;  Location: [place];  Service: Orthopedics;  Laterality: Left;   AMPUTATION Left 10/11/2019   Procedure: LEFT ABOVE KNEE AMPUTATION;  Surgeon: Newt Minion, MD;  Location: [place];  Service: Orthopedics;  Laterality: Left;   CATARACT EXTRACTION W/PHACO Right 02/11/2020   Procedure: CATARACT EXTRACTION PHACO AND INTRAOCULAR LENS PLACEMENT (IOC);  Surgeon: Jalene Mullet, MD;  Location: [place];  Service: Ophthalmology;  Laterality: Right;   CORONARY ARTERY BYPASS GRAFT     EYE SURGERY     lazer   GAS INSERTION Right 12/31/2019   Procedure: INSERTION OF GAS;  Surgeon: Jalene Mullet, MD;  Location: [place];  Service: Ophthalmology;  Laterality: Right;   INCISION AND DRAINAGE  ABSCESS     multiple I&Ds   INCISION AND DRAINAGE ABSCESS Left 07/09/2012   Procedure: DRESSING CHANGE, THIGH WOUND;  Surgeon: Harl Bowie, MD;  Location: [place];  Service: General;  Laterality: Left;   INCISION AND DRAINAGE OF WOUND Left 07/07/2012   Procedure: IRRIGATION AND DEBRIDEMENT WOUND;  Surgeon: Harl Bowie, MD;  Location: [place];  Service: General;  Laterality: Left;   INCISION AND DRAINAGE PERIRECTAL ABSCESS Left 07/14/2012   Procedure: DEBRIDEMENT OF SKIN & SOFT TISSUE; DRESSING CHANGE UNDER ANESTHESIA;  Surgeon: Gayland Curry, MD,FACS;  Location: [place];  Service: General;  Laterality: Left;   INCISION AND DRAINAGE PERIRECTAL ABSCESS Left 07/16/2012   Procedure: I&D Left Thigh;  Surgeon: Gwenyth Ober, MD;  Location: [place];  Service: General;  Laterality: Left;   INCISION AND DRAINAGE PERIRECTAL ABSCESS N/A 01/05/2015   Procedure: IRRIGATION AND DEBRIDEMENT PERIRECTAL ABSCESS;  Surgeon: Donnie Mesa, MD;  Location: [place];  Service: General;  Laterality: N/A;   IRRIGATION AND DEBRIDEMENT ABSCESS Left 07/06/2012   Procedure: IRRIGATION AND DEBRIDEMENT ABSCESS BUTTOCKS AND THIGH;  Surgeon: Shann Medal, MD;  Location: [place];  Service: General;  Laterality: Left;   IRRIGATION AND DEBRIDEMENT ABSCESS Left 08/10/2012   Procedure: IRRIGATION AND DEBRIDEMENT ABSCESS;  Surgeon: Madilyn Hook, DO;  Location: [place];  Service: General;  Laterality: Left;   PARS PLANA VITRECTOMY Right 12/31/2019   Procedure: PARS PLANA VITRECTOMY WITH 25 GAUGE;  Surgeon: Jalene Mullet, MD;  Location: [place];  Service: Ophthalmology;  Laterality: Right;   PHOTOCOAGULATION WITH LASER Right 12/31/2019   Procedure: PHOTOCOAGULATION WITH LASER;  Surgeon: Jalene Mullet, MD;  Location: [place];  Service: Ophthalmology;  Laterality: Right;   REPAIR OF COMPLEX TRACTION RETINAL DETACHMENT Right 12/31/2019   Procedure: REPAIR OF COMPLEX TRACTION RETINAL;  Surgeon: Jalene Mullet, MD;  Location: [place];  Service:  Ophthalmology;  Laterality: Right;   STUMP REVISION Left 06/09/2017   Procedure: REVISION LEFT BELOW KNEE AMPUTATION;  Surgeon: Newt Minion, MD;  Location: [place];  Service: Orthopedics;  Laterality: Left;   STUMP REVISION Left 09/04/2019   Procedure: LEFT BELOW KNEE AMPUTATION REVISION;  Surgeon: Newt Minion, MD;  Location: [place];  Service: Orthopedics;  Laterality: Left;   STUMP REVISION Left 09/20/2019   Procedure: LEFT BELOW KNEE AMPUTATION REVISION;  Surgeon: Newt Minion, MD;  Location: [place];  Service: Orthopedics;  Laterality: Left;   TONSILLECTOMY  1994   Family History  Family History  Problem Relation Age of Onset   Heart failure Mother    Diabetes Mother  Kidney disease Mother    Kidney disease Father    Diabetes Father    Diabetes Paternal Grandmother    Heart failure Paternal Grandmother    Other Other    Social History  reports that she has been smoking cigarettes. She has a 8.00 pack-year smoking history. She has never used smokeless tobacco. She reports current alcohol use. She reports that she does not use drugs. Allergies  Allergies  Allergen Reactions   Vancomycin Other (See Comments)    Acute renal failure suspected secondary to vanco   Home medications Prior to Admission medications   Medication Sig Start Date End Date Taking? Authorizing Provider  allopurinol (ZYLOPRIM) 100 MG tablet Take 0.5 tablets (50 mg total) by mouth 2 (two) times a week. Patient taking differently: Take 50 mg by mouth daily as needed. 10/05/20  Yes Katsadouros, Vasilios, MD  amitriptyline (ELAVIL) 100 MG tablet Take 1 tablet (100 mg total) by mouth at bedtime. 02/13/20  Yes Sanjuan Dame, MD  amLODipine (NORVASC) 10 MG tablet Take 1 tablet (10 mg total) by mouth daily. 10/07/19  Yes Chundi, Vahini, MD  atorvastatin (LIPITOR) 20 MG tablet Take 1 tablet (20 mg total) by mouth daily. 06/30/20  Yes Jose Persia, MD  blood glucose meter kit and supplies KIT ICD 10- E11.69. Based on  patient and insurance preference. Use up to four times daily as directed. 05/01/18  Yes Love, Ivan Anchors, PA-C  bumetanide (BUMEX) 2 MG tablet Take 1 tablet (2 mg total) by mouth 2 (two) times daily. Patient taking differently: Take by mouth See admin instructions. Take 1 tablets by mouth in the mornings, and 2 tablets by mouth in the evening 12/16/20  Yes Katsadouros, Vasilios, MD  buPROPion (WELLBUTRIN XL) 150 MG 24 hr tablet Take 1 tablet (150 mg total) by mouth every morning. 09/10/20 09/10/21 Yes Aslam, Sadia, MD  cyclobenzaprine (FLEXERIL) 5 MG tablet Please take one tab as needed for muscle spasms. 12/15/20  Yes Katsadouros, Vasilios, MD  diclofenac Sodium (VOLTAREN) 1 % GEL Apply 1 application topically 4 (four) times daily as needed (pain). 12/14/20  Yes Katsadouros, Vasilios, MD  hydrALAZINE (APRESOLINE) 50 MG tablet TAKE 1 TABLET BY MOUTH 3 TIMES DAILY 10/09/20   Jose Persia, MD  levothyroxine (SYNTHROID) 88 MCG tablet Take 88 mcg by mouth daily. 12/08/20  Yes [provider]  lisinopril (ZESTRIL) 10 MG tablet Take 10 mg by mouth daily. 10/12/20  Yes [provider]  metoprolol succinate (TOPROL-XL) 25 MG 24 hr tablet TAKE 1 TABLET BY MOUTH EVERY DAY 10/13/20  Yes Katsadouros, Vasilios, MD  oxyCODONE-acetaminophen (PERCOCET/ROXICET) 5-325 MG tablet TAKE 1 OR 2 TABLETS BY MOUTH EVERY DAY AS NEEDED FOR SEVERE PAIN 12/08/20  Yes Katsadouros, Vasilios, MD  TRUEPLUS PEN NEEDLES 31G X 5 MM MISC Use pen needle with insulin 3 times daily Patient taking differently: 1 each by Other route 3 (three) times daily. 08/12/19  Yes Bartholomew Crews, MD  VICTOZA 18 MG/3ML SOPN Inject 1.8 mg into the skin at bedtime. 10/13/20  Yes Katsadouros, Vasilios, MD  sodium bicarbonate 650 MG tablet Take 1 tablet (650 mg total) by mouth 2 (two) times daily. Patient not taking: Reported on 01/01/2021 09/26/19   Asencion Noble, MD     Vitals:   01/01/21 1218 01/01/21 1339 01/01/21 1400 01/01/21 1452   BP:   (!) 122/91 120/79  Pulse: 88 85 86 88  Resp: 19 16 (!) 26 18  Temp:    (!) 97.4 F (36.3 C)  TempSrc:    Oral  SpO2: 98% 98% 98% 98%   Exam Gen alert, no distress No rash, cyanosis or gangrene Sclera anicteric, throat clear  No jvd or bruits Chest clear bilat to bases, no rales/ wheezing RRR no MRG Abd soft ntnd no mass or ascites +bs GU normal MS no joint effusions or deformity Ext L AKA, bilat hip edema 2+, RLE lower leg edema 1-2+  Neuro is alert, Ox 3 , nf, no asterixis     Home meds include zyloprim, elavil, norvasc, lipitor, bumex 77m bid, wellbutrin xl , flexeril prn, hydralazine , synthroid, lisinopril, toprol xl , percocet prn, victoza, sod bicarb, prns    Date   Creat  eGFR   2009- 12  0.57- 0.82   2014   0.87- 2.4  AkI episode   2016- 18  0.8- 1.90   2019- 2020  1.13- 2.70    2021   2.16- 3.13 20- 31 ml/min, stage IV   08/2020  5.02     09/2020  4.29  12   01/01/21  6.45  8      UA 12/31/20 -  prot > 300, rare bact, 11-20 rbc, > 50 wbc   ECHO June 2021 - normal LVEF, no sig valve issues, no DD    BP 126/ 73  HR 88  RR 18  RA 98%      Na 138  K 4.3  CO2 19  BUN 65  Cr 6.45  Alb 2.7  Hb 8.3    CT abd - Adrenals/Urinary Tract: Bilateral adrenal glands are unremarkable. No hydronephrosis. 8 mm left lower pole angiomyolipoma. No renal, ureteral or bladder calculus visualized. Diffuse thickening of the urinary bladder wall.  Assessment/ Plan: CKD V - renal function continues to deteriorate. Has missed office visits, last was April 2021.  Suspect she is having uremic symptoms (N/V, loss of appetite).  CT abd w/o obstruction. UA +proteinuria c/w diab nephropathy. Continue gentle IVF to see if creat improves, although she looks a bit vol overloaded in the legs. If no improvement I will discuss the possibility of initiating HD this admission with her.  She is in a bit of shock about her declining renal function and not sure she will agree to HD now though. Will  follow.  Pyuria/ flank pain/ bladder wall thickening by CT - possible infection, started on IV abx DM2 on insulin HTN - per primary team Anemia - likely CKD related.  Hb 8's, follow      RKelly Splinter MD 01/01/2021, 3:23 PM  Recent Labs  Lab 01/01/21 0049 01/01/21 0600  WBC 8.9 6.7  HGB 9.4* 8.3*   Recent Labs  Lab 12/31/20 1601 01/01/21 0049 01/01/21 0600  K 5.3*  --  4.3  BUN 68*  --  65*  CREATININE 6.26* 6.58* 6.45*  CALCIUM 8.9  --  7.9*

## 2021-01-01 NOTE — ED Notes (Signed)
Breakfast tray provided. 

## 2021-01-01 NOTE — ED Notes (Signed)
Pt assisted to bedside commode. And lunch tray provided.

## 2021-01-01 NOTE — Progress Notes (Signed)
[name] [name]  Code Status: FULL [name] [name] is a 45 y.o. female patient admitted from ED awake, alert - oriented X4 - no acute distress noted. VSS -  no c/o shortness of breath, no c/o chest pain. Cardiac tele in place. Fall assessment complete, with patient able to verbalize understanding of risk associated with falls, and verbalized understanding to call nursing before up out of bed. Call light within reach, patient able to voice, and demonstrate understanding. Skin, clean-dry- intact without evidence of bruising, or skin tears.  No evidence of skin break down noted on exam.  ?  Will cont to eval and treat per MD orders.  Melonie Florida, RN  01/01/2021

## 2021-01-01 NOTE — Progress Notes (Signed)
PROGRESS NOTE    [name] [name]  DGU:[id number] DOB: [birth year] DOA: 12/31/2020 PCP: Riesa Pope, MD   Chief Complaint  Patient presents with   Fever   Brief Narrative: 45 year old female with history of CKD stage IV, hypertension diabetes tobacco abuse anemia CHF left AKA, morbidly obesity chronic pain presented with persistent nausea vomiting since Wednesday along with left flank pain.  Denies any chest pain shortness of breath fever chills or dysuria. Patient was seen in the ED labs showed worsening renal failure metabolic acidosis UA concerning for UTI CT abdomen pelvis showed features concerning for cystitis gallbladder with stones with no features of cholecystitis, patient had placed on IV fluids antibiotics and admitted  Subjective: Having persistent nausea vomiting since Wednesday This am no nausea or vomiting, no abdomen pain. C/o pain her chronic pain knees/stomach No fever, no shortness of breath.   Assessment & Plan:  AKI on CKD IV: AKI likely suspect due to bouts of nausea vomiting.  On gentle hydration BUN/creatinine remains elevated, will obtain formal nephrology consultation-notifed Dr. Jonnie Finner. Currently no significant fluid overload does have nonpitting edema but no shortness of breath.  I will hold lisinopril, Bumex, monitor intake output and electrolytes closely.Normally in 280 lb past months Recent Labs  Lab 12/31/20 1601 01/01/21 0049 01/01/21 0600  BUN 68*  --  65*  CREATININE 6.26* 9.56* 3.87*   Metabolic acidosis bicarb at 19 in the setting of CKD AKI.  Monitor continue sodium bicarb p.o.   Hypertensive urgency: Blood pressure well controlled currently continue amlodipine metoprolol, hydralazine holding Bumex and lisinopril.  Suspected Pyelonephritis: Continue ceftriaxone, follow-up culture data only .current  Currently stable hemodynamically with no leukocytosis. Recent Labs  Lab 12/31/20 1601 01/01/21 0049 01/01/21 0600  WBC 8.2 8.9  6.7   Hypothyroidism continue her Synthroid  Controlled type 2 diabetes mellitus : Controlled HbA1c 6.0 back in April, blood sugar stable keep on sliding scale Recent Labs  Lab 01/01/21 0754  GLUCAP 104*    OSA not using CPAP. " Does not work for me"  Lt AKA from infection after she dropped tablet on her left lef in Nov 2018  Morbid Obesity: Check weight for episil BMI  Chronic pain on opiates- resumed  Anemia likely from advanced kidney disease.  Monitor hemoglobin and transfuse if less than 7 g  Diet Order             Diet renal/carb modified with fluid restriction Diet-HS Snack? Nothing; Fluid restriction: 1200 mL Fluid; Room service appropriate? Yes; Fluid consistency: Thin  Diet effective now                   Patient's There is no height or weight on file to calculate BMI.  DVT prophylaxis: heparin injection 5,000 Units Start: 12/31/20 2345 Code Status:   Code Status: Full Code  Family Communication: plan of care discussed with patient at bedside. Status is: admitted as Observation Remains hospitalized will need ? 2 midnights for management of AKI-changed to inpatient Dispo: The patient is from: Home              Anticipated d/c is to: Home              Patient currently is not medically stable to d/c.   Difficult to place patient No  Unresulted Labs (From admission, onward)     Start     Ordered   12/31/20 2004  Urine Culture  Once,   STAT  Question:  Indication  Answer:  Urgency/frequency   12/31/20 2003           Medications reviewed: Scheduled Meds:  amitriptyline  100 mg Oral QHS   amLODipine  10 mg Oral Daily   atorvastatin  20 mg Oral Daily   buPROPion  150 mg Oral Daily   heparin  5,000 Units Subcutaneous Q8H   hydrALAZINE  50 mg Oral TID   insulin aspart  0-6 Units Subcutaneous TID WC   levothyroxine  88 mcg Oral Daily   metoprolol succinate  25 mg Oral Daily   sodium bicarbonate  650 mg Oral BID   Continuous Infusions:  sodium  chloride 100 mL/hr at 01/01/21 0900   cefTRIAXone (ROCEPHIN)  IV     Consultants:see note  Procedures:see note Antimicrobials: Anti-infectives (From admission, onward)    Start     Dose/Rate Route Frequency Ordered Stop   01/01/21 2000  cefTRIAXone (ROCEPHIN) 2 g in sodium chloride 0.9 % 100 mL IVPB        2 g 200 mL/hr over 30 Minutes Intravenous Every 24 hours 12/31/20 2341     12/31/20 2015  cefTRIAXone (ROCEPHIN) 1 g in sodium chloride 0.9 % 100 mL IVPB        1 g 200 mL/hr over 30 Minutes Intravenous  Once 12/31/20 2003 12/31/20 2105      Culture/Microbiology    Component Value Date/Time   SDES BLOOD RIGHT HAND 10/22/2019 0942   SPECREQUEST  10/22/2019 0942    BOTTLES DRAWN AEROBIC AND ANAEROBIC Blood Culture adequate volume   CULT  10/22/2019 0942    NO GROWTH 5 DAYS Performed at [facility], [place] [address] [place], [place] [address]    REPTSTATUS 10/27/2019 FINAL 10/22/2019 9147    Other culture-see note  Objective: Vitals: Today's Vitals   01/01/21 0609 01/01/21 0749 01/01/21 0807 01/01/21 0807  BP:   (!) 156/104   Pulse:  89 82   Resp:  15 14   Temp:      TempSrc:      SpO2:  98% 100%   PainSc: 7    10-Worst pain ever    Intake/Output Summary (Last 24 hours) at 01/01/2021 1027 Last data filed at 01/01/2021 0857 Gross per 24 hour  Intake 3280.25 ml  Output --  Net 3280.25 ml   There were no vitals filed for this visit. Weight change:   Intake/Output from previous day: 08/11 0701 - 08/12 0700 In: 2281.3 [I.V.:781.3; IV Piggyback:1500] Out: -  Intake/Output this shift: Total I/O In: 999 [I.V.:999] Out: -  There were no vitals filed for this visit. Examination: General exam: AAO x3, obese. HEENT:Oral mucosa moist, Ear/Nose WNL grossly,dentition normal. Respiratory system: bilaterally diminished, no use of accessory muscle, non tender. Cardiovascular system: S1 & S2 +,No JVD. Gastrointestinal system: Abdomen soft, NT,ND, BS+. Nervous  System:Alert, awake, moving extremities Extremities: non pitting edema, lt AKA, distal peripheral pulses palpable.  Skin: No rashes,no icterus. MSK: Normal muscle bulk,tone, power.  Data Reviewed: I have personally reviewed following labs and imaging studies CBC: Recent Labs  Lab 12/31/20 1601 01/01/21 0049 01/01/21 0600  WBC 8.2 8.9 6.7  NEUTROABS 6.6  --   --   HGB 10.2* 9.4* 8.3*  HCT 32.8* 30.0* 27.5*  MCV 82.6 84.0 84.6  PLT 280 293 829   Basic Metabolic Panel: Recent Labs  Lab 12/31/20 1601 01/01/21 0049 01/01/21 0600  NA 142  --  138  K 5.3*  --  4.3  CL 112*  --  110  CO2 19*  --  19*  GLUCOSE 157*  --  110*  BUN 68*  --  65*  CREATININE 6.26* 6.58* 6.45*  CALCIUM 8.9  --  7.9*   GFR: CrCl cannot be calculated (Unknown ideal weight.). Liver Function Tests: Recent Labs  Lab 12/31/20 1601 01/01/21 0600  AST 10* 10*  ALT 9 9  ALKPHOS 75 60  BILITOT 0.5 0.5  PROT 7.6 6.1*  ALBUMIN 3.4* 2.7*   Recent Labs  Lab 12/31/20 1601  LIPASE 37   No results for input(s): AMMONIA in the last 168 hours. Coagulation Profile: No results for input(s): INR, PROTIME in the last 168 hours. Cardiac Enzymes: Recent Labs  Lab 01/01/21 0600  CKTOTAL 101   BNP (last 3 results) No results for input(s): PROBNP in the last 8760 hours. HbA1C: No results for input(s): HGBA1C in the last 72 hours. CBG: Recent Labs  Lab 01/01/21 0754  GLUCAP 104*   Lipid Profile: No results for input(s): CHOL, HDL, LDLCALC, TRIG, CHOLHDL, LDLDIRECT in the last 72 hours. Thyroid Function Tests: No results for input(s): TSH, T4TOTAL, FREET4, T3FREE, THYROIDAB in the last 72 hours. Anemia Panel: No results for input(s): VITAMINB12, FOLATE, FERRITIN, TIBC, IRON, RETICCTPCT in the last 72 hours. Sepsis Labs: No results for input(s): PROCALCITON, LATICACIDVEN in the last 168 hours.  Recent Results (from the past 240 hour(s))  Resp Panel by RT-PCR (Flu A&B, Covid) Nasopharyngeal Swab      Status: None   Collection Time: 12/31/20  6:24 PM   Specimen: Nasopharyngeal Swab; Nasopharyngeal(NP) swabs in vial transport medium  Result Value Ref Range Status   SARS Coronavirus 2 by RT PCR NEGATIVE NEGATIVE Final    Comment: (NOTE) SARS-CoV-2 target nucleic acids are NOT DETECTED.  The SARS-CoV-2 RNA is generally detectable in upper respiratory specimens during the acute phase of infection. The lowest concentration of SARS-CoV-2 viral copies this assay can detect is 138 copies/mL. A negative result does not preclude SARS-Cov-2 infection and should not be used as the sole basis for treatment or other patient management decisions. A negative result may occur with  improper specimen collection/handling, submission of specimen other than nasopharyngeal swab, presence of viral mutation(s) within the areas targeted by this assay, and inadequate number of viral copies(<138 copies/mL). A negative result must be combined with clinical observations, patient history, and epidemiological information. The expected result is Negative.  Fact Sheet for Patients:  EntrepreneurPulse.com.au  Fact Sheet for Healthcare Providers:  IncredibleEmployment.be  This test is no t yet approved or cleared by the [place] FDA and  has been authorized for detection and/or diagnosis of SARS-CoV-2 by FDA under an Emergency Use Authorization (EUA). This EUA will remain  in effect (meaning this test can be used) for the duration of the COVID-19 declaration under Section 564(b)(1) of the Act, 21 U.S.C.section 360bbb-3(b)(1), unless the authorization is terminated  or revoked sooner.       Influenza A by PCR NEGATIVE NEGATIVE Final   Influenza B by PCR NEGATIVE NEGATIVE Final    Comment: (NOTE) The Xpert Xpress SARS-CoV-2/FLU/RSV plus assay is intended as an aid in the diagnosis of influenza from Nasopharyngeal swab specimens and should not be used as a sole basis  for treatment. Nasal washings and aspirates are unacceptable for Xpert Xpress SARS-CoV-2/FLU/RSV testing.  Fact Sheet for Patients: EntrepreneurPulse.com.au  Fact Sheet for Healthcare Providers: IncredibleEmployment.be  This test is not yet approved or cleared by the [place]  States FDA and has been authorized for detection and/or diagnosis of SARS-CoV-2 by FDA under an Emergency Use Authorization (EUA). This EUA will remain in effect (meaning this test can be used) for the duration of the COVID-19 declaration under Section 564(b)(1) of the Act, 21 U.S.C. section 360bbb-3(b)(1), unless the authorization is terminated or revoked.  Performed at [facility], [place] [address], [place], [place] [address]      Radiology Studies: CT ABDOMEN PELVIS WO CONTRAST  Result Date: 12/31/2020 CLINICAL DATA:  Abdominal pain, diffuse EXAM: CT ABDOMEN AND PELVIS WITHOUT CONTRAST TECHNIQUE: Multidetector CT imaging of the abdomen and pelvis was performed following the standard protocol without IV contrast. COMPARISON:  October 27, 2019 FINDINGS: Lower chest: Ground-glass opacities with some interlobular septal thickening and micro nodularity in the lung bases. Hepatobiliary: Unremarkable noncontrast appearance of the hepatic parenchyma. Cholelithiasis without evidence of acute cholecystitis. No biliary ductal dilation. Pancreas: Unremarkable noncontrast appearance of the pancreatic parenchyma. No pancreatic ductal dilation. Spleen: Within normal limits. Adrenals/Urinary Tract: Bilateral adrenal glands are unremarkable. No hydronephrosis. 8 mm left lower pole angiomyolipoma. No renal, ureteral or bladder calculus visualized. Diffuse thickening of the urinary bladder wall. Stomach/Bowel: Stomach is unremarkable. No pathologic dilation of small bowel. Terminal ileum appears normal. Appendix appears normal. There is mild fatty replacement of the ascending and  transverse colonic wall. Vascular/Lymphatic: No abdominal aortic aneurysm. Prominent retroperitoneal lymph nodes measuring up to 7 mm at the left common iliac on image 52/2 with unchanged size of this node since May 29, 2019, favoring a benign reactive etiology. Reproductive: Uterus and bilateral adnexa are unremarkable. Other: Mild diffuse nonspecific subcutaneous edema. Misty appearance of the mesentery and retroperitoneum. No walled off fluid collections or significant free fluid. Musculoskeletal: Multilevel degenerative changes spine. No acute osseous abnormality. IMPRESSION: 1. Diffuse thickening of the urinary bladder wall. Correlate with urinalysis to exclude cystitis. 2. Ground-glass opacities with some interlobular septal thickening and micro nodularity in the lung bases. Findings likely reflect an infectious or inflammatory process. 3. Cholelithiasis without evidence of acute cholecystitis. 4. Prominent retroperitoneal lymph nodes measuring up to 7 mm with unchanged size of this node since May 29, 2019, favoring a benign reactive etiology. Electronically Signed   By: Dahlia Bailiff MD   On: 12/31/2020 19:22     LOS: 0 days   Antonieta Pert, MD [facility]  01/01/2021, 10:27 AM

## 2021-01-02 DIAGNOSIS — E1122 Type 2 diabetes mellitus with diabetic chronic kidney disease: Secondary | ICD-10-CM

## 2021-01-02 DIAGNOSIS — Z89612 Acquired absence of left leg above knee: Secondary | ICD-10-CM

## 2021-01-02 DIAGNOSIS — N179 Acute kidney failure, unspecified: Secondary | ICD-10-CM | POA: Diagnosis not present

## 2021-01-02 DIAGNOSIS — I129 Hypertensive chronic kidney disease with stage 1 through stage 4 chronic kidney disease, or unspecified chronic kidney disease: Secondary | ICD-10-CM

## 2021-01-02 DIAGNOSIS — F17210 Nicotine dependence, cigarettes, uncomplicated: Secondary | ICD-10-CM

## 2021-01-02 DIAGNOSIS — N185 Chronic kidney disease, stage 5: Secondary | ICD-10-CM

## 2021-01-02 LAB — CBC
HCT: 27.5 % — ABNORMAL LOW (ref 36.0–46.0)
Hemoglobin: 8.2 g/dL — ABNORMAL LOW (ref 12.0–15.0)
MCH: 26 pg (ref 26.0–34.0)
MCHC: 29.8 g/dL — ABNORMAL LOW (ref 30.0–36.0)
MCV: 87.3 fL (ref 80.0–100.0)
Platelets: 205 10*3/uL (ref 150–400)
RBC: 3.15 MIL/uL — ABNORMAL LOW (ref 3.87–5.11)
RDW: 14.8 % (ref 11.5–15.5)
WBC: 4.8 10*3/uL (ref 4.0–10.5)
nRBC: 0 % (ref 0.0–0.2)

## 2021-01-02 LAB — BASIC METABOLIC PANEL
Anion gap: 9 (ref 5–15)
BUN: 74 mg/dL — ABNORMAL HIGH (ref 6–20)
CO2: 18 mmol/L — ABNORMAL LOW (ref 22–32)
Calcium: 7.8 mg/dL — ABNORMAL LOW (ref 8.9–10.3)
Chloride: 106 mmol/L (ref 98–111)
Creatinine, Ser: 7.51 mg/dL — ABNORMAL HIGH (ref 0.44–1.00)
GFR, Estimated: 6 mL/min — ABNORMAL LOW (ref >60)
Glucose, Bld: 126 mg/dL — ABNORMAL HIGH (ref 70–99)
Potassium: 5.6 mmol/L — ABNORMAL HIGH (ref 3.5–5.1)
Sodium: 133 mmol/L — ABNORMAL LOW (ref 135–145)

## 2021-01-02 LAB — GLUCOSE, CAPILLARY
Glucose-Capillary: 125 mg/dL — ABNORMAL HIGH (ref 70–99)
Glucose-Capillary: 117 mg/dL — ABNORMAL HIGH (ref 70–99)
Glucose-Capillary: 110 mg/dL — ABNORMAL HIGH (ref 70–99)
Glucose-Capillary: 131 mg/dL — ABNORMAL HIGH (ref 70–99)

## 2021-01-02 LAB — URINE CULTURE

## 2021-01-02 MED ORDER — ONDANSETRON HCL 4 MG/2ML IJ SOLN
4.0000 mg | Freq: Three times a day (TID) | INTRAMUSCULAR | Status: DC | PRN
Start: 2021-01-02 — End: 2021-01-12
  Administered 2021-01-02: 4 mg via INTRAVENOUS
  Filled 2021-01-02: qty 2

## 2021-01-02 MED ORDER — SODIUM ZIRCONIUM CYCLOSILICATE 10 G PO PACK
10.0000 g | PACK | Freq: Three times a day (TID) | ORAL | Status: AC
  Administered 2021-01-02 (×2): 10 g via ORAL
  Filled 2021-01-02 (×2): qty 1

## 2021-01-02 MED ORDER — FUROSEMIDE 10 MG/ML IJ SOLN
80.0000 mg | Freq: Every day | INTRAMUSCULAR | Status: AC
  Administered 2021-01-02 – 2021-01-03 (×2): 80 mg via INTRAVENOUS
  Filled 2021-01-02 (×3): qty 8

## 2021-01-02 NOTE — H&P (View-Only) (Signed)
Vascular and Vein Specialist of [facility]  Patient name: [name] [name] MRN: [id number] DOB: 12/07/1975 Sex: female   REQUESTING PROVIDER:    Renal   REASON FOR CONSULT:    Dialysis access  HISTORY OF PRESENT ILLNESS:   [name] [name] is a 45 y.o. female, who I have been asked to evaluate for dialysis access.  Her renal failure secondary to diabetes and hypertension.  She is left-handed.  The patient has a history of a left above-knee amputation.  She is a smoker.  She uses a CPAP for sleep apnea  PAST MEDICAL HISTORY    Past Medical History:  Diagnosis Date   Abdominal muscle pain 09/08/2016   Abnormal Pap smear of cervix 2009   Abscess    history of multiple abscesses   Acute bilateral low back pain 02/13/2017   Acute blood loss anemia    Acute on chronic renal failure ([place]) 07/12/2012   Acute renal failure ([place]) 07/12/2012   Adjustment disorder with depressed mood 03/26/2017   AKI (acute kidney injury) ([place])    Anemia 10/21/2019   Anemia of chronic disease 2002   Anxiety    Panic attacks   Bilateral lower extremity edema 05/13/2016   Bipolar disorder ([place])    Cataract    B/L cataract   Cellulitis 05/21/2014   right eye   Chronic bronchitis ([place])    "get it q yr" (05/13/2013)   Chronic pain    Chronic pain of right knee 09/08/2016   Dehiscence of amputation stump ([place])    Depression    Edema of lower extremity    Endocarditis 2002   subacute bacterial endocarditis.    Family history of anesthesia complication    "my mom has a hard time coming out from under"   Fibromyalgia    GERD (gastroesophageal reflux disease)    occ   Heart murmur    Herpes simplex type 1 infection 01/16/2018   History of blood transfusion    "just low blood count" (05/13/2013)   Hyperlipidemia    Hypertension    Hypothyroidism    Hypothyroidism, adult 03/21/2014   Hypoxia    Leukocytosis    Necrosis (H[facility])    and ulceration    Necrotizing fasciitis s/p OR debridements 07/06/2012   Obesity    OSA on CPAP    does not wear all the time   Peripheral neuropathy    Pneumonia    Severe protein-calorie malnutrition ([facility])    Type II diabetes mellitus ([place])    Type  II   Wound dehiscence 09/04/2019     FAMILY HISTORY   Family History  Problem Relation Age of Onset   Heart failure Mother    Diabetes Mother    Kidney disease Mother    Kidney disease Father    Diabetes Father    Diabetes Paternal Grandmother    Heart failure Paternal Grandmother    Other Other     SOCIAL HISTORY:   Social History   Socioeconomic History   Marital status: Single    Spouse name: Not on file   Number of children: 0   Years of education: 11   Highest education level: Not on file  Occupational History   Occupation: disability  Tobacco Use   Smoking status: Every Day    Packs/day: 0.50    Years: 16.00    Pack years: 8.00    Types: Cigarettes    Last attempt to quit: 07/22/2019    Years since  quitting: 1.4   Smokeless tobacco: Never  Vaping Use   Vaping Use: Former   Devices: CBD oil  Substance and Sexual Activity   Alcohol use: Yes    Alcohol/week: 0.0 standard drinks    Comment: social   Drug use: No   Sexual activity: Yes  Other Topics Concern   Not on file  Social History Narrative   Lives with mother currently, goddaughter and uncle in a one story home.     On disability for diabetes, neuropathy, sleep apnea etc.  Disability since 2014.    Education: 11th grade.    Social Determinants of Health   Financial Resource Strain: Not on file  Food Insecurity: Not on file  Transportation Needs: Not on file  Physical Activity: Not on file  Stress: Not on file  Social Connections: Not on file  Intimate Partner Violence: Not on file    ALLERGIES:    Allergies  Allergen Reactions   Vancomycin Other (See Comments)    Acute renal failure suspected secondary to vanco    CURRENT MEDICATIONS:    Current  Facility-Administered Medications  Medication Dose Route Frequency Provider Last Rate Last Admin   acetaminophen (TYLENOL) tablet 650 mg  650 mg Oral Q6H PRN Rise Patience, MD   650 mg at 01/01/21 9030   Or   acetaminophen (TYLENOL) suppository 650 mg  650 mg Rectal Q6H PRN Rise Patience, MD       amitriptyline (ELAVIL) tablet 100 mg  100 mg Oral QHS Rise Patience, MD   100 mg at 01/01/21 2225   amLODipine (NORVASC) tablet 10 mg  10 mg Oral Daily Rise Patience, MD   10 mg at 01/02/21 1013   atorvastatin (LIPITOR) tablet 20 mg  20 mg Oral Daily Rise Patience, MD   20 mg at 01/02/21 1003   buPROPion (WELLBUTRIN XL) 24 hr tablet 150 mg  150 mg Oral Daily Rise Patience, MD   150 mg at 01/02/21 1002   cefTRIAXone (ROCEPHIN) 2 g in sodium chloride 0.9 % 100 mL IVPB  2 g Intravenous Q24H Rise Patience, MD 200 mL/hr at 01/02/21 2014 2 g at 01/02/21 2014   cyclobenzaprine (FLEXERIL) tablet 5 mg  5 mg Oral Daily PRN Rise Patience, MD   5 mg at 01/01/21 0820   furosemide (LASIX) injection 80 mg  80 mg Intravenous Daily Roney Jaffe, MD   80 mg at 01/02/21 1819   heparin injection 5,000 Units  5,000 Units Subcutaneous Q8H Rise Patience, MD   5,000 Units at 01/02/21 1756   hydrALAZINE (APRESOLINE) tablet 50 mg  50 mg Oral TID Rise Patience, MD   50 mg at 01/02/21 1012   insulin aspart (novoLOG) injection 0-6 Units  0-6 Units Subcutaneous TID WC Rise Patience, MD   1 Units at 01/01/21 1218   levothyroxine (SYNTHROID) tablet 88 mcg  88 mcg Oral Daily Kc, Maren Beach, MD   88 mcg at 01/02/21 0923   metoprolol succinate (TOPROL-XL) 24 hr tablet 25 mg  25 mg Oral Daily Rise Patience, MD   25 mg at 01/02/21 1013   ondansetron (ZOFRAN) injection 4 mg  4 mg Intravenous Q8H PRN Lovey Newcomer T, NP   4 mg at 01/02/21 0610   oxyCODONE-acetaminophen (PERCOCET/ROXICET) 5-325 MG per tablet 1 tablet  1 tablet Oral Q6H PRN Antonieta Pert, MD   1  tablet at 01/02/21 0743   sodium bicarbonate tablet 650 mg  650 mg Oral BID Rise Patience, MD   650 mg at 01/02/21 1002    REVIEW OF SYSTEMS:   [X]  denotes positive finding, [ ]  denotes negative finding Cardiac  Comments:  Chest pain or chest pressure:    Shortness of breath upon exertion:    Short of breath when lying flat:    Irregular heart rhythm:        Vascular    Pain in calf, thigh, or hip brought on by ambulation:    Pain in feet at night that wakes you up from your sleep:     Blood clot in your veins:    Leg swelling:         Pulmonary    Oxygen at home:    Productive cough:     Wheezing:         Neurologic    Sudden weakness in arms or legs:     Sudden numbness in arms or legs:     Sudden onset of difficulty speaking or slurred speech:    Temporary loss of vision in one eye:     Problems with dizziness:         Gastrointestinal    Blood in stool:      Vomited blood:         Genitourinary    Burning when urinating:     Blood in urine:        Psychiatric    Major depression:         Hematologic    Bleeding problems:    Problems with blood clotting too easily:        Skin    Rashes or ulcers:        Constitutional    Fever or chills:     PHYSICAL EXAM:   Vitals:   01/02/21 0704 01/02/21 1012 01/02/21 1013 01/02/21 1202  BP:  129/73  110/62  Pulse:   92 82  Resp:    16  Temp:    98.2 F (36.8 C)  TempSrc:    Oral  SpO2:    99%  Weight: 131.2 kg     Height:        GENERAL: The patient is a well-nourished female, in no acute distress. The vital signs are documented above. CARDIAC: There is a regular rate and rhythm.  VASCULAR: Palpable left radial pulse.  Currently she has a peripheral IV in her antecubital crease on the left PULMONARY: Nonlabored respirations ABDOMEN: Soft and non-tender with normal pitched bowel sounds.  MUSCULOSKELETAL: Left above-knee amputation NEUROLOGIC: No focal weakness or paresthesias are detected. SKIN:  There are no ulcers or rashes noted. PSYCHIATRIC: The patient has a normal affect.  STUDIES:   Vein mapping studies have been ordered but are not yet available.  ASSESSMENT and PLAN   CKD 5 needing dialysis access: I discussed with the patient that in order to initiate dialysis we would need to place a tunneled dialysis catheter.  In addition we would proceed with a fistula or graft and her left arm.  Vein mapping studies are pending.  I discussed the details of the procedure with the patient including the risk of steal, and the need for additional surgeries.  She is going to further discuss her desire to start dialysis with Dr. Soyla Murphy.  As it stands now, we will plan on tunneled dialysis catheter and left arm fistula versus graft tomorrow.  She will be n.p.o. after midnight.  I will asked the nurses  to remove the IV in her left arm and place it in the right.   Leia Alf, MD, FACS [facility] [phone] Pager [phone]

## 2021-01-02 NOTE — Progress Notes (Signed)
PROGRESS NOTE    [name] [name]  WGY:[id number] DOB: Mar 25, 1976 DOA: 12/31/2020 PCP: Riesa Pope, MD   Brief Narrative: This 45 year old female with history of CKD stage IV, hypertension, diabetes, tobacco abuse, anemia ,CHF, left AKA, morbidly obesity, chronic pain presented with persistent nausea, vomiting since Wednesday along with left flank pain.  Denies any chest pain, shortness of breath, fever, chills or dysuria. Patient was seen in the ED labs showed worsening renal failure, metabolic acidosis, UA concerning for UTI.  CT abdomen pelvis showed features concerning for cystitis, gallbladder with stones with no features of cholecystitis, patient had placed on IV fluids, antibiotics and admitted. Patient is admitted on AKI on CKD started on IV hydration.  Nephrology consulted,  patient might  need hemodialysis during this admission.  Assessment & Plan:   Principal Problem:   ARF (acute renal failure) (HCC) Active Problems:   Controlled type 2 diabetes mellitus (HCC)   OSA (obstructive sleep apnea)   Pyelonephritis   Hypertensive urgency   Acute renal failure (ARF) (HCC)  AKI on CKD IV:  AKI likely suspect due to recurrent bouts of nausea and vomiting.   Continue gentle hydration, BUN/creatinine still remains elevated. Nephrology consulted,  recommended to continue IV hydration.   Currently no significant fluid overload does have nonpitting edema but no shortness of breath.   Continue to hold nephrotoxic medications, lisinopril, Bumex. Suspect she is exhibiting uremic symptoms (nausea,  vomiting, loss of appetite). If no improvement she may need hemodialysis this admission.  Hyperkalemia could be secondary to worsening renal failure.: Lokelma 10 mg twice daily.  Continue to monitor serum potassium  Metabolic acidosis:   bicarb at 19 in the setting of CKD AKI.  Monitor bicarbonate with sodium bicarbonate tablets.   Hypertensive urgency:  Blood pressure well  controlled currently,  continue amlodipine metoprolol, hydralazine holding Bumex and lisinopril.   Suspected Pyelonephritis:  Continue ceftriaxone, urine culture contaminated.  Currently stable hemodynamically with no leukocytosis.   Hypothyroidism:  continue her Synthroid   Controlled type 2 diabetes mellitus : Controlled HbA1c 6.0 back in April, blood sugar stable.  keep on sliding scale  OSA not using CPAP.  Refused.  " Does not work for me"   Lt AKA: from infection after she dropped tablet on her left leg in Nov 2018   Morbid Obesity: Check weight for BMI   Chronic pain on opiates-continue home pain medications.   Anemia likely from advanced kidney disease.  Monitor hemoglobin and transfuse if less than 7 g   DVT prophylaxis: Heparin Code Status: Full code. Family Communication: No family at bed side. Disposition Plan:   Status is: Inpatient  Remains inpatient appropriate because:Inpatient level of care appropriate due to severity of illness  Dispo: The patient is from: Home              Anticipated d/c is to: Home              Patient currently is not medically stable to d/c.   Difficult to place patient No  Consultants:  Nephrology  Procedures:   Antimicrobials:   Anti-infectives (From admission, onward)    Start     Dose/Rate Route Frequency Ordered Stop   01/01/21 2000  cefTRIAXone (ROCEPHIN) 2 g in sodium chloride 0.9 % 100 mL IVPB        2 g 200 mL/hr over 30 Minutes Intravenous Every 24 hours 12/31/20 2341     12/31/20 2015  cefTRIAXone (ROCEPHIN) 1 g in sodium chloride  0.9 % 100 mL IVPB        1 g 200 mL/hr over 30 Minutes Intravenous  Once 12/31/20 2003 12/31/20 2105       Subjective: Patient was seen and examined at bedside.  Overnight events noted.   Patient was sitting comfortably on the bed,  having breakfast.  Denies any pain and shortness of breath.  Objective: Vitals:   01/02/21 0704 01/02/21 1012 01/02/21 1013 01/02/21 1202  BP:  129/73   110/62  Pulse:   92 82  Resp:    16  Temp:    98.2 F (36.8 C)  TempSrc:    Oral  SpO2:    99%  Weight: 131.2 kg     Height:        Intake/Output Summary (Last 24 hours) at 01/02/2021 1316 Last data filed at 01/02/2021 0900 Gross per 24 hour  Intake 1421.72 ml  Output 850 ml  Net 571.72 ml   Filed Weights   01/02/21 0704  Weight: 131.2 kg    Examination:  General exam: Appears calm and comfortable.  Not in any acute distress. Respiratory system: Clear to auscultation, respiratory effort normal, RR 15 Cardiovascular system: S1 & S2 heard, RRR. No JVD, murmurs, rubs, gallops or clicks. No pedal edema. Gastrointestinal system: Abdomen is nondistended, soft and nontender. No organomegaly or masses felt. Normal bowel sounds heard. Central nervous system: Alert and oriented. No focal neurological deficits. Extremities: Left BKA, no edema, no cyanosis, no clubbing Skin: No rashes, lesions or ulcers Psychiatry: Judgement and insight appear normal. Mood & affect appropriate.     Data Reviewed: I have personally reviewed following labs and imaging studies  CBC: Recent Labs  Lab 12/31/20 1601 01/01/21 0049 01/01/21 0600 01/02/21 0553  WBC 8.2 8.9 6.7 4.8  NEUTROABS 6.6  --   --   --   HGB 10.2* 9.4* 8.3* 8.2*  HCT 32.8* 30.0* 27.5* 27.5*  MCV 82.6 84.0 84.6 87.3  PLT 280 293 210 416   Basic Metabolic Panel: Recent Labs  Lab 12/31/20 1601 01/01/21 0049 01/01/21 0600 01/02/21 0553  NA 142  --  138 133*  K 5.3*  --  4.3 5.6*  CL 112*  --  110 106  CO2 19*  --  19* 18*  GLUCOSE 157*  --  110* 126*  BUN 68*  --  65* 74*  CREATININE 6.26* 6.58* 6.45* 7.51*  CALCIUM 8.9  --  7.9* 7.8*   GFR: Estimated Creatinine Clearance: 13.9 mL/min (A) (by C-G formula based on SCr of 7.51 mg/dL (H)). Liver Function Tests: Recent Labs  Lab 12/31/20 1601 01/01/21 0600  AST 10* 10*  ALT 9 9  ALKPHOS 75 60  BILITOT 0.5 0.5  PROT 7.6 6.1*  ALBUMIN 3.4* 2.7*   Recent Labs   Lab 12/31/20 1601  LIPASE 37   No results for input(s): AMMONIA in the last 168 hours. Coagulation Profile: No results for input(s): INR, PROTIME in the last 168 hours. Cardiac Enzymes: Recent Labs  Lab 01/01/21 0600  CKTOTAL 101   BNP (last 3 results) No results for input(s): PROBNP in the last 8760 hours. HbA1C: No results for input(s): HGBA1C in the last 72 hours. CBG: Recent Labs  Lab 01/01/21 1154 01/01/21 1605 01/01/21 2215 01/02/21 0754 01/02/21 1200  GLUCAP 165* 138* 147* 125* 117*   Lipid Profile: No results for input(s): CHOL, HDL, LDLCALC, TRIG, CHOLHDL, LDLDIRECT in the last 72 hours. Thyroid Function Tests: No results for input(s): TSH, T4TOTAL,  FREET4, T3FREE, THYROIDAB in the last 72 hours. Anemia Panel: No results for input(s): VITAMINB12, FOLATE, FERRITIN, TIBC, IRON, RETICCTPCT in the last 72 hours. Sepsis Labs: No results for input(s): PROCALCITON, LATICACIDVEN in the last 168 hours.  Recent Results (from the past 240 hour(s))  Resp Panel by RT-PCR (Flu A&B, Covid) Nasopharyngeal Swab     Status: None   Collection Time: 12/31/20  6:24 PM   Specimen: Nasopharyngeal Swab; Nasopharyngeal(NP) swabs in vial transport medium  Result Value Ref Range Status   SARS Coronavirus 2 by RT PCR NEGATIVE NEGATIVE Final    Comment: (NOTE) SARS-CoV-2 target nucleic acids are NOT DETECTED.  The SARS-CoV-2 RNA is generally detectable in upper respiratory specimens during the acute phase of infection. The lowest concentration of SARS-CoV-2 viral copies this assay can detect is 138 copies/mL. A negative result does not preclude SARS-Cov-2 infection and should not be used as the sole basis for treatment or other patient management decisions. A negative result may occur with  improper specimen collection/handling, submission of specimen other than nasopharyngeal swab, presence of viral mutation(s) within the areas targeted by this assay, and inadequate number of  viral copies(<138 copies/mL). A negative result must be combined with clinical observations, patient history, and epidemiological information. The expected result is Negative.  Fact Sheet for Patients:  EntrepreneurPulse.com.au  Fact Sheet for Healthcare Providers:  IncredibleEmployment.be  This test is no t yet approved or cleared by the [place] FDA and  has been authorized for detection and/or diagnosis of SARS-CoV-2 by FDA under an Emergency Use Authorization (EUA). This EUA will remain  in effect (meaning this test can be used) for the duration of the COVID-19 declaration under Section 564(b)(1) of the Act, 21 U.S.C.section 360bbb-3(b)(1), unless the authorization is terminated  or revoked sooner.       Influenza A by PCR NEGATIVE NEGATIVE Final   Influenza B by PCR NEGATIVE NEGATIVE Final    Comment: (NOTE) The Xpert Xpress SARS-CoV-2/FLU/RSV plus assay is intended as an aid in the diagnosis of influenza from Nasopharyngeal swab specimens and should not be used as a sole basis for treatment. Nasal washings and aspirates are unacceptable for Xpert Xpress SARS-CoV-2/FLU/RSV testing.  Fact Sheet for Patients: EntrepreneurPulse.com.au  Fact Sheet for Healthcare Providers: IncredibleEmployment.be  This test is not yet approved or cleared by the [place] FDA and has been authorized for detection and/or diagnosis of SARS-CoV-2 by FDA under an Emergency Use Authorization (EUA). This EUA will remain in effect (meaning this test can be used) for the duration of the COVID-19 declaration under Section 564(b)(1) of the Act, 21 U.S.C. section 360bbb-3(b)(1), unless the authorization is terminated or revoked.  Performed at [facility], [place] [address], [place], [place] [address]   Urine Culture     Status: Abnormal   Collection Time: 01/01/21 12:49 AM   Specimen: Urine, Clean  Catch  Result Value Ref Range Status   Specimen Description   Final    URINE, CLEAN CATCH Performed at [facility], [place] [address] [place], [place] [address]    Special Requests   Final    NONE Performed at [facility], [place] [address], [place], [place] [address]    Culture MULTIPLE SPECIES PRESENT, SUGGEST RECOLLECTION (A)  Final   Report Status 01/02/2021 FINAL  Final    Radiology Studies: CT ABDOMEN PELVIS WO CONTRAST  Result Date: 12/31/2020 CLINICAL DATA:  Abdominal pain, diffuse EXAM: CT ABDOMEN AND PELVIS WITHOUT CONTRAST TECHNIQUE:  Multidetector CT imaging of the abdomen and pelvis was performed following the standard protocol without IV contrast. COMPARISON:  October 27, 2019 FINDINGS: Lower chest: Ground-glass opacities with some interlobular septal thickening and micro nodularity in the lung bases. Hepatobiliary: Unremarkable noncontrast appearance of the hepatic parenchyma. Cholelithiasis without evidence of acute cholecystitis. No biliary ductal dilation. Pancreas: Unremarkable noncontrast appearance of the pancreatic parenchyma. No pancreatic ductal dilation. Spleen: Within normal limits. Adrenals/Urinary Tract: Bilateral adrenal glands are unremarkable. No hydronephrosis. 8 mm left lower pole angiomyolipoma. No renal, ureteral or bladder calculus visualized. Diffuse thickening of the urinary bladder wall. Stomach/Bowel: Stomach is unremarkable. No pathologic dilation of small bowel. Terminal ileum appears normal. Appendix appears normal. There is mild fatty replacement of the ascending and transverse colonic wall. Vascular/Lymphatic: No abdominal aortic aneurysm. Prominent retroperitoneal lymph nodes measuring up to 7 mm at the left common iliac on image 52/2 with unchanged size of this node since May 29, 2019, favoring a benign reactive etiology. Reproductive: Uterus and bilateral adnexa are unremarkable. Other: Mild diffuse nonspecific  subcutaneous edema. Misty appearance of the mesentery and retroperitoneum. No walled off fluid collections or significant free fluid. Musculoskeletal: Multilevel degenerative changes spine. No acute osseous abnormality. IMPRESSION: 1. Diffuse thickening of the urinary bladder wall. Correlate with urinalysis to exclude cystitis. 2. Ground-glass opacities with some interlobular septal thickening and micro nodularity in the lung bases. Findings likely reflect an infectious or inflammatory process. 3. Cholelithiasis without evidence of acute cholecystitis. 4. Prominent retroperitoneal lymph nodes measuring up to 7 mm with unchanged size of this node since May 29, 2019, favoring a benign reactive etiology. Electronically Signed   By: Dahlia Bailiff MD   On: 12/31/2020 19:22     Scheduled Meds:  amitriptyline  100 mg Oral QHS   amLODipine  10 mg Oral Daily   atorvastatin  20 mg Oral Daily   buPROPion  150 mg Oral Daily   heparin  5,000 Units Subcutaneous Q8H   hydrALAZINE  50 mg Oral TID   insulin aspart  0-6 Units Subcutaneous TID WC   levothyroxine  88 mcg Oral Daily   metoprolol succinate  25 mg Oral Daily   sodium bicarbonate  650 mg Oral BID   sodium zirconium cyclosilicate  10 g Oral TID   Continuous Infusions:  cefTRIAXone (ROCEPHIN)  IV 2 g (01/01/21 1932)     LOS: 1 day    Time spent: 35 mins    Selvin Yun, MD [facility]   If 7PM-7AM, please contact night-coverage

## 2021-01-02 NOTE — Plan of Care (Signed)
  Problem: Education: Goal: Knowledge of General Education information will improve Description: Including pain rating scale, medication(s)/side effects and non-pharmacologic comfort measures Outcome: Progressing   Problem: Clinical Measurements: Goal: Will remain free from infection Outcome: Progressing   Problem: Pain Managment: Goal: General experience of comfort will improve Outcome: Progressing   

## 2021-01-02 NOTE — Progress Notes (Signed)
Pt has nausea this morning and has been dry heaving some.  Pt states there is pressure and pain that feels like gas pain in her central abdomen.  PRN Zofran administered at 0610.  Pt tolerated sips of ginger ale and her synthroid at 0620.  Pt also expressed discomfort similar to that of constipation.    Transferred pt to bedside commode but no bowel movement.  Will continue to monitor.

## 2021-01-02 NOTE — Progress Notes (Addendum)
[facility] Progress Note  Subjective: cret up 7.4, no new c/o's.   Vitals:   01/02/21 0704 01/02/21 1012 01/02/21 1013 01/02/21 1202  BP:  129/73  110/62  Pulse:   92 82  Resp:    16  Temp:    98.2 F (36.8 C)  TempSrc:    Oral  SpO2:    99%  Weight: 131.2 kg     Height:        Exam:  alert, nad   no jvd  Chest cta bilat  Cor reg no RG  Abd soft ntnd no ascites   Ext L AKA, bilat 1-2+ hip and pretib RLE edema   Alert, NF, ox3, no asterixis     Home meds include zyloprim, elavil, norvasc, lipitor, bumex 19m bid, wellbutrin xl , flexeril prn, hydralazine , synthroid, lisinopril, toprol xl , percocet prn, victoza, sod bicarb, prns     Date                           Creat               eGFR   2009- 12                    0.57- 0.82   2014                          0.87- 2.4          AkI episode   2016- 18                    0.8- 1.90   2019- 2020                1.13- 2.70           2021                          2.16- 3.13        20- 31 ml/min, stage IV   08/2020                       5.02                    09/2020                       4.29                 12   01/01/21                      6.45                 8      UA 12/31/20 -  prot > 300, rare bact, 11-20 rbc, > 50 wbc   ECHO June 2021 - normal LVEF, no sig valve issues, no DD    BP 126/ 73  HR 88  RR 18  RA 98%      Na 138  K 4.3  CO2 19  BUN 65  Cr 6.45  Alb 2.7  Hb 8.3    CT abd - Adrenals/Urinary Tract: Bilateral adrenal glands are unremarkable. No hydronephrosis. 8 mm left lower pole angiomyolipoma. No renal, ureteral or bladder calculus visualized. Diffuse thickening of the urinary bladder wall.  Assessment/ Plan: CKD V - in 2021 pt was stage IV, this year has progressed to stage V and eGFR continues to decline down to 8 ml//min on admit (crat 6.4). CT abd w/o obstruction. UA +proteinuria c/w diab nephropathy. Endorsing sig appetite loss, and has had bouts of N/V. Suspect this is uremia. No  improvement in creat w/ IVF's, worse today. Recommend to start HD. Will need transfer to [facility], will d/w primary team. Will order vein mapping, consult [facility].  Hyperkalemia - lokelma ordered, renal diet Pyuria/ flank pain/ bladder wall thickening by CT - possible infection, started on IV abx DM2 on insulin HTN - per primary team Anemia - likely CKD related.  Hb 8's, follow     Rob Wendle Kina 01/02/2021, 2:42 PM   Recent Labs  Lab 01/01/21 0600 01/02/21 0553  K 4.3 5.6*  BUN 65* 74*  CREATININE 6.45* 7.51*  CALCIUM 7.9* 7.8*  HGB 8.3* 8.2*   Inpatient medications:  amitriptyline  100 mg Oral QHS   amLODipine  10 mg Oral Daily   atorvastatin  20 mg Oral Daily   buPROPion  150 mg Oral Daily   heparin  5,000 Units Subcutaneous Q8H   hydrALAZINE  50 mg Oral TID   insulin aspart  0-6 Units Subcutaneous TID WC   levothyroxine  88 mcg Oral Daily   metoprolol succinate  25 mg Oral Daily   sodium bicarbonate  650 mg Oral BID   sodium zirconium cyclosilicate  10 g Oral TID    cefTRIAXone (ROCEPHIN)  IV 2 g (01/01/21 1932)   acetaminophen **OR** acetaminophen, cyclobenzaprine, ondansetron (ZOFRAN) IV, oxyCODONE-acetaminophen

## 2021-01-02 NOTE — Progress Notes (Signed)
Assumed care of patient, agree with previous RN assessment  

## 2021-01-02 NOTE — Consult Note (Signed)
Vascular and Vein Specialist of [facility]  Patient name: [name] [name] MRN: [id number] DOB: [birth year] Sex: female   REQUESTING PROVIDER:    Renal   REASON FOR CONSULT:    Dialysis access  HISTORY OF PRESENT ILLNESS:   [name] [name] is a 45 y.o. female, who I have been asked to evaluate for dialysis access.  Her renal failure secondary to diabetes and hypertension.  She is left-handed.  The patient has a history of a left above-knee amputation.  She is a smoker.  She uses a CPAP for sleep apnea  PAST MEDICAL HISTORY    Past Medical History:  Diagnosis Date   Abdominal muscle pain 09/08/2016   Abnormal Pap smear of cervix 2009   Abscess    history of multiple abscesses   Acute bilateral low back pain 02/13/2017   Acute blood loss anemia    Acute on chronic renal failure ([place]) 07/12/2012   Acute renal failure ([place]) 07/12/2012   Adjustment disorder with depressed mood 03/26/2017   AKI (acute kidney injury) ([place])    Anemia 10/21/2019   Anemia of chronic disease 2002   Anxiety    Panic attacks   Bilateral lower extremity edema 05/13/2016   Bipolar disorder ([place])    Cataract    B/L cataract   Cellulitis 05/21/2014   right eye   Chronic bronchitis ([place])    "get it q yr" (05/13/2013)   Chronic pain    Chronic pain of right knee 09/08/2016   Dehiscence of amputation stump ([place])    Depression    Edema of lower extremity    Endocarditis 2002   subacute bacterial endocarditis.    Family history of anesthesia complication    "my mom has a hard time coming out from under"   Fibromyalgia    GERD (gastroesophageal reflux disease)    occ   Heart murmur    Herpes simplex type 1 infection 01/16/2018   History of blood transfusion    "just low blood count" (05/13/2013)   Hyperlipidemia    Hypertension    Hypothyroidism    Hypothyroidism, adult 03/21/2014   Hypoxia    Leukocytosis    Necrosis (HCC)    and ulceration    Necrotizing fasciitis s/p OR debridements 07/06/2012   Obesity    OSA on CPAP    does not wear all the time   Peripheral neuropathy    Pneumonia    Severe protein-calorie malnutrition (HCC)    Type II diabetes mellitus ([place])    Type  II   Wound dehiscence 09/04/2019     FAMILY HISTORY   Family History  Problem Relation Age of Onset   Heart failure Mother    Diabetes Mother    Kidney disease Mother    Kidney disease Father    Diabetes Father    Diabetes Paternal Grandmother    Heart failure Paternal Grandmother    Other Other     SOCIAL HISTORY:   Social History   Socioeconomic History   Marital status: Single    Spouse name: Not on file   Number of children: 0   Years of education: 11   Highest education level: Not on file  Occupational History   Occupation: disability  Tobacco Use   Smoking status: Every Day    Packs/day: 0.50    Years: 16.00    Pack years: 8.00    Types: Cigarettes    Last attempt to quit: 07/22/2019    Years since  quitting: 1.4   Smokeless tobacco: Never  Vaping Use   Vaping Use: Former   Devices: CBD oil  Substance and Sexual Activity   Alcohol use: Yes    Alcohol/week: 0.0 standard drinks    Comment: social   Drug use: No   Sexual activity: Yes  Other Topics Concern   Not on file  Social History Narrative   Lives with mother currently, goddaughter and uncle in a one story home.     On disability for diabetes, neuropathy, sleep apnea etc.  Disability since 2014.    Education: 11th grade.    Social Determinants of Health   Financial Resource Strain: Not on file  Food Insecurity: Not on file  Transportation Needs: Not on file  Physical Activity: Not on file  Stress: Not on file  Social Connections: Not on file  Intimate Partner Violence: Not on file    ALLERGIES:    Allergies  Allergen Reactions   Vancomycin Other (See Comments)    Acute renal failure suspected secondary to vanco    CURRENT MEDICATIONS:    Current  Facility-Administered Medications  Medication Dose Route Frequency Provider Last Rate Last Admin   acetaminophen (TYLENOL) tablet 650 mg  650 mg Oral Q6H PRN Rise Patience, MD   650 mg at 01/01/21 6811   Or   acetaminophen (TYLENOL) suppository 650 mg  650 mg Rectal Q6H PRN Rise Patience, MD       amitriptyline (ELAVIL) tablet 100 mg  100 mg Oral QHS Rise Patience, MD   100 mg at 01/01/21 2225   amLODipine (NORVASC) tablet 10 mg  10 mg Oral Daily Rise Patience, MD   10 mg at 01/02/21 1013   atorvastatin (LIPITOR) tablet 20 mg  20 mg Oral Daily Rise Patience, MD   20 mg at 01/02/21 1003   buPROPion (WELLBUTRIN XL) 24 hr tablet 150 mg  150 mg Oral Daily Rise Patience, MD   150 mg at 01/02/21 1002   cefTRIAXone (ROCEPHIN) 2 g in sodium chloride 0.9 % 100 mL IVPB  2 g Intravenous Q24H Rise Patience, MD 200 mL/hr at 01/02/21 2014 2 g at 01/02/21 2014   cyclobenzaprine (FLEXERIL) tablet 5 mg  5 mg Oral Daily PRN Rise Patience, MD   5 mg at 01/01/21 0820   furosemide (LASIX) injection 80 mg  80 mg Intravenous Daily Roney Jaffe, MD   80 mg at 01/02/21 1819   heparin injection 5,000 Units  5,000 Units Subcutaneous Q8H Rise Patience, MD   5,000 Units at 01/02/21 1756   hydrALAZINE (APRESOLINE) tablet 50 mg  50 mg Oral TID Rise Patience, MD   50 mg at 01/02/21 1012   insulin aspart (novoLOG) injection 0-6 Units  0-6 Units Subcutaneous TID WC Rise Patience, MD   1 Units at 01/01/21 1218   levothyroxine (SYNTHROID) tablet 88 mcg  88 mcg Oral Daily Kc, Maren Beach, MD   88 mcg at 01/02/21 5726   metoprolol succinate (TOPROL-XL) 24 hr tablet 25 mg  25 mg Oral Daily Rise Patience, MD   25 mg at 01/02/21 1013   ondansetron (ZOFRAN) injection 4 mg  4 mg Intravenous Q8H PRN Lovey Newcomer T, NP   4 mg at 01/02/21 0610   oxyCODONE-acetaminophen (PERCOCET/ROXICET) 5-325 MG per tablet 1 tablet  1 tablet Oral Q6H PRN Antonieta Pert, MD   1  tablet at 01/02/21 0743   sodium bicarbonate tablet 650 mg  650 mg Oral BID Rise Patience, MD   650 mg at 01/02/21 1002    REVIEW OF SYSTEMS:   [X]  denotes positive finding, [ ]  denotes negative finding Cardiac  Comments:  Chest pain or chest pressure:    Shortness of breath upon exertion:    Short of breath when lying flat:    Irregular heart rhythm:        Vascular    Pain in calf, thigh, or hip brought on by ambulation:    Pain in feet at night that wakes you up from your sleep:     Blood clot in your veins:    Leg swelling:         Pulmonary    Oxygen at home:    Productive cough:     Wheezing:         Neurologic    Sudden weakness in arms or legs:     Sudden numbness in arms or legs:     Sudden onset of difficulty speaking or slurred speech:    Temporary loss of vision in one eye:     Problems with dizziness:         Gastrointestinal    Blood in stool:      Vomited blood:         Genitourinary    Burning when urinating:     Blood in urine:        Psychiatric    Major depression:         Hematologic    Bleeding problems:    Problems with blood clotting too easily:        Skin    Rashes or ulcers:        Constitutional    Fever or chills:     PHYSICAL EXAM:   Vitals:   01/02/21 0704 01/02/21 1012 01/02/21 1013 01/02/21 1202  BP:  129/73  110/62  Pulse:   92 82  Resp:    16  Temp:    98.2 F (36.8 C)  TempSrc:    Oral  SpO2:    99%  Weight: 131.2 kg     Height:        GENERAL: The patient is a well-nourished female, in no acute distress. The vital signs are documented above. CARDIAC: There is a regular rate and rhythm.  VASCULAR: Palpable left radial pulse.  Currently she has a peripheral IV in her antecubital crease on the left PULMONARY: Nonlabored respirations ABDOMEN: Soft and non-tender with normal pitched bowel sounds.  MUSCULOSKELETAL: Left above-knee amputation NEUROLOGIC: No focal weakness or paresthesias are detected. SKIN:  There are no ulcers or rashes noted. PSYCHIATRIC: The patient has a normal affect.  STUDIES:   Vein mapping studies have been ordered but are not yet available.  ASSESSMENT and PLAN   CKD 5 needing dialysis access: I discussed with the patient that in order to initiate dialysis we would need to place a tunneled dialysis catheter.  In addition we would proceed with a fistula or graft and her left arm.  Vein mapping studies are pending.  I discussed the details of the procedure with the patient including the risk of steal, and the need for additional surgeries.  She is going to further discuss her desire to start dialysis with Dr. Soyla Murphy.  As it stands now, we will plan on tunneled dialysis catheter and left arm fistula versus graft tomorrow.  She will be n.p.o. after midnight.  I will asked the nurses  to remove the IV in her left arm and place it in the right.   Leia Alf, MD, FACS Vascular and Vein Specialists of [facility] [phone] Pager [phone]

## 2021-01-03 ENCOUNTER — Inpatient Hospital Stay (HOSPITAL_COMMUNITY): Payer: Medicare HMO

## 2021-01-03 DIAGNOSIS — N184 Chronic kidney disease, stage 4 (severe): Secondary | ICD-10-CM | POA: Diagnosis not present

## 2021-01-03 DIAGNOSIS — Z992 Dependence on renal dialysis: Secondary | ICD-10-CM

## 2021-01-03 DIAGNOSIS — N186 End stage renal disease: Secondary | ICD-10-CM | POA: Diagnosis not present

## 2021-01-03 DIAGNOSIS — N179 Acute kidney failure, unspecified: Secondary | ICD-10-CM | POA: Diagnosis not present

## 2021-01-03 DIAGNOSIS — I160 Hypertensive urgency: Secondary | ICD-10-CM | POA: Diagnosis not present

## 2021-01-03 DIAGNOSIS — N12 Tubulo-interstitial nephritis, not specified as acute or chronic: Secondary | ICD-10-CM | POA: Diagnosis not present

## 2021-01-03 LAB — CBC
HCT: 25.5 % — ABNORMAL LOW (ref 36.0–46.0)
Hemoglobin: 7.7 g/dL — ABNORMAL LOW (ref 12.0–15.0)
MCH: 25.8 pg — ABNORMAL LOW (ref 26.0–34.0)
MCHC: 30.2 g/dL (ref 30.0–36.0)
MCV: 85.3 fL (ref 80.0–100.0)
Platelets: 184 10*3/uL (ref 150–400)
RBC: 2.99 MIL/uL — ABNORMAL LOW (ref 3.87–5.11)
RDW: 14.6 % (ref 11.5–15.5)
WBC: 4.6 10*3/uL (ref 4.0–10.5)
nRBC: 0 % (ref 0.0–0.2)

## 2021-01-03 LAB — BASIC METABOLIC PANEL
Anion gap: 12 (ref 5–15)
BUN: 77 mg/dL — ABNORMAL HIGH (ref 6–20)
CO2: 17 mmol/L — ABNORMAL LOW (ref 22–32)
Calcium: 7.7 mg/dL — ABNORMAL LOW (ref 8.9–10.3)
Chloride: 105 mmol/L (ref 98–111)
Creatinine, Ser: 7.91 mg/dL — ABNORMAL HIGH (ref 0.44–1.00)
GFR, Estimated: 6 mL/min — ABNORMAL LOW (ref >60)
Glucose, Bld: 117 mg/dL — ABNORMAL HIGH (ref 70–99)
Potassium: 4.8 mmol/L (ref 3.5–5.1)
Sodium: 134 mmol/L — ABNORMAL LOW (ref 135–145)

## 2021-01-03 LAB — HEPATITIS B SURFACE ANTIBODY,QUALITATIVE: Hep B S Ab: NONREACTIVE

## 2021-01-03 LAB — GLUCOSE, CAPILLARY
Glucose-Capillary: 94 mg/dL (ref 70–99)
Glucose-Capillary: 103 mg/dL — ABNORMAL HIGH (ref 70–99)
Glucose-Capillary: 129 mg/dL — ABNORMAL HIGH (ref 70–99)

## 2021-01-03 LAB — HEPATITIS B SURFACE ANTIGEN: Hepatitis B Surface Ag: NONREACTIVE

## 2021-01-03 LAB — HEPATITIS B CORE ANTIBODY, IGM: Hep B C IgM: NONREACTIVE

## 2021-01-03 MED ORDER — CHLORHEXIDINE GLUCONATE CLOTH 2 % EX PADS
6.0000 | MEDICATED_PAD | Freq: Every day | CUTANEOUS | Status: DC
  Administered 2021-01-04 – 2021-01-08 (×5): 6 via TOPICAL

## 2021-01-03 MED ORDER — HYDROCERIN EX CREA
TOPICAL_CREAM | Freq: Two times a day (BID) | CUTANEOUS | Status: DC
  Filled 2021-01-03: qty 113

## 2021-01-03 NOTE — Progress Notes (Signed)
Patient ID: [name] [name], female   DOB: [birth year], 45 y.o.   MRN: [id number]  PROGRESS NOTE    SURAIYA DICKERSON  WNU:[id number] DOB: 1976-01-28 DOA: 12/31/2020 PCP: Riesa Pope, MD   Brief Narrative:  45 year old female with history of chronic kidney disease stage IV, hypertension, diabetes mellitus type 2, tobacco abuse, anemia of chronic disease, chronic, left AKA, morbid obesity, chronic pain presented with nausea and vomiting with left flank pain.  On presentation, she was found to have worsening renal failure with metabolic acidosis and possible UTI.  CT of the abdomen and pelvis showed features concerning for cystitis, gallstones with no cholecystitis.  She was started on IV fluids and antibiotics.  Nephrology was consulted who recommended transfer patient to [facility] for possible need of hemodialysis.  Assessment & Plan:   AKI on CKD stage IV Intermittently cirrhosis -Nephrology following.  Patient has been transferred to [facility] for possible need of hemodialysis.  Creatinine pending today, 7.51 on 01/02/2021 -Hold nephrotoxic medications including lisinopril and Bumex -Strict input and output.  Daily weights.  Fluid restriction.  Currently on Lasix 80 mg IV daily as per nephrology.  Hyperkalemia -Labs pending for today.  Monitor  Hypertensive urgency -Blood pressure currently on the lower side.  Continue amlodipine, Lasix, metoprolol, hydralazine.  Bumex and lisinopril on hold  Suspect acute pyelonephritis -Urine culture grew multiple species.  Continue Rocephin  Hypothyroidism-continue Synthroid  Diabetes mellitus type 2 -A1c 6 in April 2022.  Continue CBGs with SSI  OSA not using CPAP -Apparently patient had stated that CPAP did not work for her  History of left AKA  Morbid obesity -Outpatient follow-up  History of chronic pain on chronic opiate use -Continue as needed opiates.  Outpatient follow-up with PCP/pain management  Anemia  of chronic disease  -hemoglobin stable.  No signs of bleeding.  Monitor  DVT prophylaxis: Heparin Code Status: Full Family Communication: None at bedside Disposition Plan: Status is: Inpatient  Remains inpatient appropriate because:Inpatient level of care appropriate due to severity of illness  Dispo: The patient is from: Home              Anticipated d/c is to: Home              Patient currently is not medically stable to d/c.   Difficult to place patient No  Consultants: Nephrology  Procedures: None  Antimicrobials: None   Subjective: Patient seen and examined at bedside.  Denies worsening shortness of breath, nausea, vomiting.  Complains of some back pain.  No overnight fever reported.  Objective: Vitals:   01/02/21 2144 01/03/21 0051 01/03/21 0518 01/03/21 0856  BP: 117/62 116/61 115/68 125/67  Pulse: 80 86 86 84  Resp: 16 18 18 17   Temp: 98.6 F (37 C) 98.5 F (36.9 C) 98.2 F (36.8 C) 98 F (36.7 C)  TempSrc: Oral Oral Oral Oral  SpO2: 99% 96% 96% 100%  Weight:   (!) 137 kg   Height:        Intake/Output Summary (Last 24 hours) at 01/03/2021 1011 Last data filed at 01/02/2021 2146 Gross per 24 hour  Intake 780 ml  Output 300 ml  Net 480 ml   Filed Weights   01/02/21 0704 01/03/21 0518  Weight: 131.2 kg (!) 137 kg    Examination:  General exam: Appears calm and comfortable.  Currently on room air. Respiratory system: Bilateral decreased breath sounds at bases with scattered crackles Cardiovascular system: S1 & S2 heard, Rate  controlled Gastrointestinal system: Abdomen is morbidly obese, nondistended, soft and nontender. Normal bowel sounds heard. Extremities: No cyanosis, clubbing; left AKA present Central nervous system: Alert and oriented. No focal neurological deficits. Moving extremities Skin: No rashes, lesions or ulcers Psychiatry: Affect is mostly flat.  Intermittently becomes anxious.   Data Reviewed: I have personally reviewed following  labs and imaging studies  CBC: Recent Labs  Lab 12/31/20 1601 01/01/21 0049 01/01/21 0600 01/02/21 0553  WBC 8.2 8.9 6.7 4.8  NEUTROABS 6.6  --   --   --   HGB 10.2* 9.4* 8.3* 8.2*  HCT 32.8* 30.0* 27.5* 27.5*  MCV 82.6 84.0 84.6 87.3  PLT 280 293 210 220   Basic Metabolic Panel: Recent Labs  Lab 12/31/20 1601 01/01/21 0049 01/01/21 0600 01/02/21 0553  NA 142  --  138 133*  K 5.3*  --  4.3 5.6*  CL 112*  --  110 106  CO2 19*  --  19* 18*  GLUCOSE 157*  --  110* 126*  BUN 68*  --  65* 74*  CREATININE 6.26* 6.58* 6.45* 7.51*  CALCIUM 8.9  --  7.9* 7.8*   GFR: Estimated Creatinine Clearance: 14.3 mL/min (A) (by C-G formula based on SCr of 7.51 mg/dL (H)). Liver Function Tests: Recent Labs  Lab 12/31/20 1601 01/01/21 0600  AST 10* 10*  ALT 9 9  ALKPHOS 75 60  BILITOT 0.5 0.5  PROT 7.6 6.1*  ALBUMIN 3.4* 2.7*   Recent Labs  Lab 12/31/20 1601  LIPASE 37   No results for input(s): AMMONIA in the last 168 hours. Coagulation Profile: No results for input(s): INR, PROTIME in the last 168 hours. Cardiac Enzymes: Recent Labs  Lab 01/01/21 0600  CKTOTAL 101   BNP (last 3 results) No results for input(s): PROBNP in the last 8760 hours. HbA1C: No results for input(s): HGBA1C in the last 72 hours. CBG: Recent Labs  Lab 01/02/21 0754 01/02/21 1200 01/02/21 1629 01/02/21 2301 01/03/21 0801  GLUCAP 125* 117* 110* 131* 94   Lipid Profile: No results for input(s): CHOL, HDL, LDLCALC, TRIG, CHOLHDL, LDLDIRECT in the last 72 hours. Thyroid Function Tests: No results for input(s): TSH, T4TOTAL, FREET4, T3FREE, THYROIDAB in the last 72 hours. Anemia Panel: No results for input(s): VITAMINB12, FOLATE, FERRITIN, TIBC, IRON, RETICCTPCT in the last 72 hours. Sepsis Labs: No results for input(s): PROCALCITON, LATICACIDVEN in the last 168 hours.  Recent Results (from the past 240 hour(s))  Resp Panel by RT-PCR (Flu A&B, Covid) Nasopharyngeal Swab     Status: None    Collection Time: 12/31/20  6:24 PM   Specimen: Nasopharyngeal Swab; Nasopharyngeal(NP) swabs in vial transport medium  Result Value Ref Range Status   SARS Coronavirus 2 by RT PCR NEGATIVE NEGATIVE Final    Comment: (NOTE) SARS-CoV-2 target nucleic acids are NOT DETECTED.  The SARS-CoV-2 RNA is generally detectable in upper respiratory specimens during the acute phase of infection. The lowest concentration of SARS-CoV-2 viral copies this assay can detect is 138 copies/mL. A negative result does not preclude SARS-Cov-2 infection and should not be used as the sole basis for treatment or other patient management decisions. A negative result may occur with  improper specimen collection/handling, submission of specimen other than nasopharyngeal swab, presence of viral mutation(s) within the areas targeted by this assay, and inadequate number of viral copies(<138 copies/mL). A negative result must be combined with clinical observations, patient history, and epidemiological information. The expected result is Negative.  Fact Sheet for  Patients:  EntrepreneurPulse.com.au  Fact Sheet for Healthcare Providers:  IncredibleEmployment.be  This test is no t yet approved or cleared by the [place] FDA and  has been authorized for detection and/or diagnosis of SARS-CoV-2 by FDA under an Emergency Use Authorization (EUA). This EUA will remain  in effect (meaning this test can be used) for the duration of the COVID-19 declaration under Section 564(b)(1) of the Act, 21 U.S.C.section 360bbb-3(b)(1), unless the authorization is terminated  or revoked sooner.       Influenza A by PCR NEGATIVE NEGATIVE Final   Influenza B by PCR NEGATIVE NEGATIVE Final    Comment: (NOTE) The Xpert Xpress SARS-CoV-2/FLU/RSV plus assay is intended as an aid in the diagnosis of influenza from Nasopharyngeal swab specimens and should not be used as a sole basis for treatment.  Nasal washings and aspirates are unacceptable for Xpert Xpress SARS-CoV-2/FLU/RSV testing.  Fact Sheet for Patients: EntrepreneurPulse.com.au  Fact Sheet for Healthcare Providers: IncredibleEmployment.be  This test is not yet approved or cleared by the [place] FDA and has been authorized for detection and/or diagnosis of SARS-CoV-2 by FDA under an Emergency Use Authorization (EUA). This EUA will remain in effect (meaning this test can be used) for the duration of the COVID-19 declaration under Section 564(b)(1) of the Act, 21 U.S.C. section 360bbb-3(b)(1), unless the authorization is terminated or revoked.  Performed at [facility], [place] [address], [place], [place] [address]   Urine Culture     Status: Abnormal   Collection Time: 01/01/21 12:49 AM   Specimen: Urine, Clean Catch  Result Value Ref Range Status   Specimen Description   Final    URINE, CLEAN CATCH Performed at [facility], [place] [address] [place], [place] [address]    Special Requests   Final    NONE Performed at [facility], [place] [address], [place], [place] [address]    Culture MULTIPLE SPECIES PRESENT, SUGGEST RECOLLECTION (A)  Final   Report Status 01/02/2021 FINAL  Final         Radiology Studies: No results found.      Scheduled Meds:  amitriptyline  100 mg Oral QHS   amLODipine  10 mg Oral Daily   atorvastatin  20 mg Oral Daily   buPROPion  150 mg Oral Daily   furosemide  80 mg Intravenous Daily   heparin  5,000 Units Subcutaneous Q8H   hydrALAZINE  50 mg Oral TID   insulin aspart  0-6 Units Subcutaneous TID WC   levothyroxine  88 mcg Oral Daily   metoprolol succinate  25 mg Oral Daily   sodium bicarbonate  650 mg Oral BID   Continuous Infusions:  cefTRIAXone (ROCEPHIN)  IV 2 g (01/02/21 2014)          Aline August, MD [facility] 01/03/2021, 10:11 AM

## 2021-01-03 NOTE — Plan of Care (Signed)
Nutrition Education Note  RD consulted as pt requesting diet education. Pt with extensive medical hx including DM, CHF, HTN, Hx of AKA, and CKD4 and presented to ED with ARF.  Called pt on room phone to discuss her questions related to diet. Pt expressed frustrations with having such limited options to pick form and that dining services would offer her foods, then tell her they weren't allowed on diet.   Discussed the role of the kidneys on removing excess minerals from blood and why diet restrictions were needed. Pt inquired about what to eat and what food to avoid when discharging. Reviewed chart and it appears that pt is being considered for HD as CKD has progressed to stage 5. As pt has a myriad nutrition related health concerns, focused discussion on limiting Na in the diet as HD will help to eliminate excess minerals from blood.  Attached "Low Sodium Nutrition Therapy" handout from the Academy of Nutrition and Dietetics to discharge instructions. Reviewed patient's dietary recall. Provided examples on ways to decrease sodium intake in diet. Discouraged intake of processed foods and use of salt shaker. Encouraged fresh or frozen fruits and vegetables or rinsing canned products. Also discussed reading food labels and avoiding products that contain >20% of daily value. Pt engaged in conversation and interested in food modifications.   Encouraged pt to request additional follow-up if questions remained prior to discharge.  Expect good compliance.  Body mass index is 44.6 kg/m. Pt meets criteria for obesity class III based on current BMI.  Current diet order is carb modified/renal, patient is consuming approximately 100% of meals at this time. Labs and medications reviewed. No further nutrition interventions warranted at this time. RD contact information provided. If additional nutrition issues arise, please re-consult RD.  Ranell Patrick, RD, LDN Clinical Dietitian Pager on [place]

## 2021-01-03 NOTE — Plan of Care (Signed)
?  Problem: Clinical Measurements: ?Goal: Will remain free from infection ?Outcome: Progressing ?  ?

## 2021-01-03 NOTE — Discharge Instructions (Addendum)
Low Sodium Nutrition Therapy  Eating less sodium can help you if you have high blood pressure, heart failure, or kidney or liver disease.   Your body needs a little sodium, but too much sodium can cause your body to hold onto extra water. This extra water will raise your blood pressure and can cause damage to your heart, kidneys, or liver as they are forced to work harder.   Sometimes you can see how the extra fluid affects you because your hands, legs, or belly swell. You may also hold water around your heart and lungs, which makes it hard to breathe.   Even if you take medication for blood pressure or a water pill (diuretic) to remove fluid, it is still important to have less salt in your diet.   Check with your primary care provider before drinking alcohol since it may affect the amount of fluid in your body and how your heart, kidneys, or liver work.  Sodium in Food A low-sodium meal plan limits the sodium that you get from food and beverages to 1,500-2,000 milligrams (mg) per day. Salt is the main source of sodium. Read the nutrition label on the package to find out how much sodium is in one serving of a food.  Select foods with 140 milligrams (mg) of sodium or less per serving.  You may be able to eat one or two servings of foods with a little more than 140 milligrams (mg) of sodium if you are closely watching how much sodium you eat in a day.  Check the serving size on the label. The amount of sodium listed on the label shows the amount in one serving of the food. So, if you eat more than one serving, you will get more sodium than the amount listed.  Tips Cutting Back on Sodium Eat more fresh foods.  Fresh fruits and vegetables are low in sodium, as well as frozen vegetables and fruits that have no added juices or sauces.  Fresh meats are lower in sodium than processed meats, such as bacon, sausage, and hotdogs.  Not all processed foods are unhealthy, but some processed foods may have  too much sodium.  Eat less salt at the table and when cooking. One of the ingredients in salt is sodium.  One teaspoon of table salt has 2,300 milligrams of sodium.  Leave the salt out of recipes for pasta, casseroles, and soups. Be a Paramedic.  Food packages that say "Salt-free", sodium-free", "very low sodium," and "low sodium" have less than 140 milligrams of sodium per serving.  Beware of products identified as "Unsalted," "No Salt Added," "Reduced Sodium," or "Lower Sodium." These items may still be high in sodium. You should always check the nutrition label. Add flavors to your food without adding sodium.  Try lemon juice, lime juice, or vinegar.  Dry or fresh herbs add flavor.  Buy a sodium-free seasoning blend or make your own at home. You can purchase salt-free or sodium-free condiments like barbeque sauce in stores and online. Ask your registered dietitian nutritionist for recommendations and where to find them.   Eating in Restaurants Choose foods carefully when you eat outside your home. Restaurant foods can be very high in sodium. Many restaurants provide nutrition facts on their menus or their websites. If you cannot find that information, ask your server. Let your server know that you want your food to be cooked without salt and that you would like your salad dressing and sauces to be served on  the side.   Foods Recommended Food Group Foods Recommended  Grains Bread, bagels, rolls without salted tops Homemade bread made with reduced-sodium baking powder Cold cereals, especially shredded wheat and puffed rice Oats, grits, or cream of wheat Pastas, quinoa, and rice Popcorn, pretzels or crackers without salt Corn tortillas  Protein Foods Fresh meats and fish; [place] bacon (check the nutrition labels - make sure they are not packaged in a sodium solution) Canned or packed tuna (no more than 4 ounces at 1 serving) Beans and peas Soybeans) and tofu Eggs Nuts or nut butters  without salt  Dairy Milk or milk powder Plant milks, such as rice and soy Yogurt, including Greek yogurt Small amounts of natural cheese (blocks of cheese) or reduced-sodium cheese can be used in moderation. (Swiss, ricotta, and fresh mozzarella cheese are lower in sodium than the others) Cream Cheese Low sodium cottage cheese  Vegetables Fresh and frozen vegetables without added sauces or salt Homemade soups (without salt) Low-sodium, salt-free or sodium-free canned vegetables and soups  Fruit Fresh and canned fruits Dried fruits, such as raisins, cranberries, and prunes  Oils Tub or liquid margarine, regular or without salt Canola, corn, peanut, olive, safflower, or sunflower oils  Condiments Fresh or dried herbs such as basil, bay leaf, dill, mustard (dry), nutmeg, paprika, parsley, rosemary, sage, or thyme.  Low sodium ketchup Vinegar  Lemon or lime juice Pepper, red pepper flakes, and cayenne. Hot sauce contains sodium, but if you use just a drop or two, it will not add up to much.  Salt-free or sodium-free seasoning mixes and marinades Simple salad dressings: vinegar and oil   Foods Not Recommended Food Group Foods Not Recommended  Grains Breads or crackers topped with salt Cereals (hot/cold) with more than 300 mg sodium per serving Biscuits, cornbread, and other "quick" breads prepared with baking soda Pre-packaged bread crumbs Seasoned and packaged rice and pasta mixes Self-rising flours  Protein Foods Cured meats: Bacon, ham, sausage, pepperoni and hot dogs Canned meats (chili, vienna sausage, or sardines) Smoked fish and meats Frozen meals that have more than 600 mg of sodium per serving Egg substitute (with added sodium)  Dairy Buttermilk Processed cheese spreads Cottage cheese (1 cup may have over 500 mg of sodium; look for low-sodium.) American or feta cheese Shredded Cheese has more sodium than blocks of cheese String cheese  Vegetables Canned vegetables  (unless they are salt-free, sodium-free or low sodium) Frozen vegetables with seasoning and sauces Sauerkraut and pickled vegetables Canned or dried soups (unless they are salt-free, sodium-free, or low sodium) [place] fries and onion rings  Fruit Dried fruits preserved with additives that have sodium  Oils Salted butter or margarine, all types of olives  Condiments Salt, sea salt, kosher salt, onion salt, and garlic salt Seasoning mixes with salt Bouillon cubes Ketchup Barbeque sauce and Worcestershire sauce unless low sodium Soy sauce Salsa, pickles, olives, relish Salad dressings: ranch, blue cheese, [place], and [place].   Low Sodium Sample 1-Day Menu  Breakfast 1 cup cooked oatmeal  1 slice whole wheat bread toast  1 tablespoon peanut butter without salt  1 banana  1 cup 1% milk  Lunch Tacos made with: 2 corn tortillas   cup black beans, low sodium   cup roasted or grilled chicken (without skin)   avocado  Squeeze of lime juice  1 cup salad greens  1 tablespoon low-sodium salad dressing   cup strawberries  1 orange  Afternoon Snack 1/3 cup grapes  6 ounces yogurt  Evening Meal 3 ounces herb-baked fish  1 baked potato  2 teaspoons olive oil   cup cooked carrots  2 thick slices tomatoes on:  2 lettuce leaves  1 teaspoon olive oil  1 teaspoon balsamic vinegar  1 cup 1% milk  Evening Snack 1 apple   cup almonds without salt   Sodium Free Flavoring Tips  When cooking, the following items may be used for flavoring instead of salt or seasonings that contain sodium. Remember: A little bit of spice goes a long way! Be careful not to overseason. Spice Blend Recipe (makes about ? cup) 5 teaspoons onion powder  2 teaspoons garlic powder  2 teaspoons paprika  2 teaspoon dry mustard  1 teaspoon crushed thyme leaves   teaspoon white pepper   teaspoon celery seed Food Item Flavorings  Beef Basil, bay leaf, caraway, curry, dill, dry mustard, garlic, grape  jelly, green pepper, mace, marjoram, mushrooms (fresh), nutmeg, onion or onion powder, parsley, pepper, rosemary, sage  Chicken Basil, cloves, cranberries, mace, mushrooms (fresh), nutmeg, oregano, paprika, parsley, pineapple, saffron, sage, savory, tarragon, thyme, tomato, turmeric  Egg Chervil, curry, dill, dry mustard, garlic or garlic powder, green pepper, jelly, mushrooms (fresh), nutmeg, onion powder, paprika, parsley, rosemary, tarragon, tomato  Fish Basil, bay leaf, chervil, curry, dill, dry mustard, green pepper, lemon juice, marjoram, mushrooms (fresh), paprika, pepper, tarragon, tomato, turmeric  Lamb Cloves, curry, dill, garlic or garlic powder, mace, mint, mint jelly, onion, oregano, parsley, pineapple, rosemary, tarragon, thyme  Pork Applesauce, basil, caraway, chives, cloves, garlic or garlic powder, onion or onion powder, rosemary, thyme  Veal Apricots, basil, bay leaf, currant jelly, curry, ginger, marjoram, mushrooms (fresh), oregano, paprika  Vegetables Basil, dill, garlic or garlic powder, ginger, lemon juice, mace, marjoram, nutmeg, onion or onion powder, tarragon, tomato, sugar or sugar substitute, salt-free salad dressing, vinegar  Desserts Allspice, anise, cinnamon, cloves, ginger, mace, nutmeg, vanilla extract, other extracts   Copyright 2020  [employer]. All rights reserved

## 2021-01-03 NOTE — Consult Note (Signed)
WOC Nurse Consult Note: Reason for Consult:chronic callus at lefty AKA site. No wounds Wound type: friction injury Pressure Injury POA:N/A Measurement:N/A Wound bed:N/A Drainage (amount, consistency, odor) N/A Periwound: intact Dressing procedure/placement/frequency: Deeply hued area of hyperkeratotic tissue from chronic friction.  I will provide guidance for twice daily application of moisturizing cream (Eucerin) while in house after cleansing. A sacral prophylactic foam dressing is to be placed over the sacrum for PI prevention.  The patient's Bedside RN Gwenlyn Perking assisted me with this consultation today.  [place] nursing team will not follow, but will remain available to this patient, the nursing and medical teams.  Please re-consult if needed. Thanks, Maudie Flakes, MSN, RN, [place], Arther Abbott  Pager# [phone]

## 2021-01-03 NOTE — Progress Notes (Signed)
VASCULAR LAB    Upper extremity vein mapping has been performed.  See CV proc for preliminary results.   Reynaldo Rossman, RVT 01/03/2021, 5:48 PM

## 2021-01-03 NOTE — Progress Notes (Signed)
[facility] Progress Note  Subjective: no labs yet, UOP 750 cc yest w IV lasix  Vitals:   01/02/21 2143 01/02/21 2144 01/03/21 0051 01/03/21 0518  BP: 117/62 117/62 116/61 115/68  Pulse:  80 86 86  Resp:  '16 18 18  ' Temp:  98.6 F (37 C) 98.5 F (36.9 C) 98.2 F (36.8 C)  TempSrc:  Oral Oral Oral  SpO2:  99% 96% 96%  Weight:    (!) 137 kg  Height:        Exam:  alert, nad   no jvd  Chest cta bilat  Cor reg no RG  Abd soft ntnd no ascites   Ext L AKA, bilat 1-2+ hip and pretib RLE edema   Alert, NF, ox3, no asterixis     Home meds include zyloprim, elavil, norvasc, lipitor, bumex 68m bid, wellbutrin xl , flexeril prn, hydralazine , synthroid, lisinopril, toprol xl , percocet prn, victoza, sod bicarb, prns     Date                           Creat               eGFR   2009- 12                    0.57- 0.82   2014                          0.87- 2.4          AkI episode   2016- 18                    0.8- 1.90   2019- 2020                1.13- 2.70           2021                          2.16- 3.13        20- 31 ml/min, stage IV   08/2020                       5.02                    09/2020                       4.29                 12   01/01/21                      6.45                 8      UA 12/31/20 -  prot > 300, rare bact, 11-20 rbc, > 50 wbc   ECHO June 2021 - normal LVEF, no sig valve issues, no DD    BP 126/ 73  HR 88  RR 18  RA 98%      Na 138  K 4.3  CO2 19  BUN 65  Cr 6.45  Alb 2.7  Hb 8.3    CT abd - Adrenals/Urinary Tract: Bilateral adrenal glands are unremarkable. No hydronephrosis. 8 mm left lower pole angiomyolipoma. No renal, ureteral or  bladder calculus visualized. Diffuse thickening of the urinary bladder wall.   Assessment/ Plan: CKD V - in 2021 pt was stage IV CKD, this year has progressed to stage V and eGFR continues to decline down to 8 ml//min on admit here (creat 6.4). CT abd w/o obstruction. UA +proteinuria c/w diab nephropathy.  Endorses sig appetite loss and bouts of N/V. Suspect pt is uremic. Will need to start dialysis. Have explained permanence of dialysis decision further and she understands better and agrees to proceed. Appreciate VVS assistance w/ access.  Vol excess - mild to moderate w/ LE edema Hyperkalemia - lokelma ordered, renal diet Pyuria/ flank pain/ bladder wall thickening by CT - possible infection, started on IV abx DM2 on insulin HTN - per primary team Anemia - likely CKD related.  Hb 8's, follow     [name] [name] 01/03/2021, 8:37 AM   Recent Labs  Lab 01/01/21 0600 01/02/21 0553  K 4.3 5.6*  BUN 65* 74*  CREATININE 6.45* 7.51*  CALCIUM 7.9* 7.8*  HGB 8.3* 8.2*    Inpatient medications:  amitriptyline  100 mg Oral QHS   amLODipine  10 mg Oral Daily   atorvastatin  20 mg Oral Daily   buPROPion  150 mg Oral Daily   furosemide  80 mg Intravenous Daily   heparin  5,000 Units Subcutaneous Q8H   hydrALAZINE  50 mg Oral TID   insulin aspart  0-6 Units Subcutaneous TID WC   levothyroxine  88 mcg Oral Daily   metoprolol succinate  25 mg Oral Daily   sodium bicarbonate  650 mg Oral BID    cefTRIAXone (ROCEPHIN)  IV 2 g (01/02/21 2014)   acetaminophen **OR** acetaminophen, cyclobenzaprine, ondansetron (ZOFRAN) IV, oxyCODONE-acetaminophen

## 2021-01-04 ENCOUNTER — Other Ambulatory Visit: Payer: Self-pay | Admitting: Student

## 2021-01-04 ENCOUNTER — Ambulatory Visit: Payer: Medicare HMO | Admitting: Physical Therapy

## 2021-01-04 DIAGNOSIS — N184 Chronic kidney disease, stage 4 (severe): Secondary | ICD-10-CM | POA: Diagnosis not present

## 2021-01-04 DIAGNOSIS — N12 Tubulo-interstitial nephritis, not specified as acute or chronic: Secondary | ICD-10-CM | POA: Diagnosis not present

## 2021-01-04 DIAGNOSIS — I160 Hypertensive urgency: Secondary | ICD-10-CM | POA: Diagnosis not present

## 2021-01-04 DIAGNOSIS — G894 Chronic pain syndrome: Secondary | ICD-10-CM

## 2021-01-04 DIAGNOSIS — N179 Acute kidney failure, unspecified: Secondary | ICD-10-CM | POA: Diagnosis not present

## 2021-01-04 LAB — BASIC METABOLIC PANEL
Anion gap: 10 (ref 5–15)
BUN: 85 mg/dL — ABNORMAL HIGH (ref 6–20)
CO2: 18 mmol/L — ABNORMAL LOW (ref 22–32)
Calcium: 7.7 mg/dL — ABNORMAL LOW (ref 8.9–10.3)
Chloride: 105 mmol/L (ref 98–111)
Creatinine, Ser: 8.49 mg/dL — ABNORMAL HIGH (ref 0.44–1.00)
GFR, Estimated: 5 mL/min — ABNORMAL LOW (ref >60)
Glucose, Bld: 117 mg/dL — ABNORMAL HIGH (ref 70–99)
Potassium: 4.9 mmol/L (ref 3.5–5.1)
Sodium: 133 mmol/L — ABNORMAL LOW (ref 135–145)

## 2021-01-04 LAB — CBC
HCT: 27.9 % — ABNORMAL LOW (ref 36.0–46.0)
Hemoglobin: 8.5 g/dL — ABNORMAL LOW (ref 12.0–15.0)
MCH: 25.8 pg — ABNORMAL LOW (ref 26.0–34.0)
MCHC: 30.5 g/dL (ref 30.0–36.0)
MCV: 84.5 fL (ref 80.0–100.0)
Platelets: 205 10*3/uL (ref 150–400)
RBC: 3.3 MIL/uL — ABNORMAL LOW (ref 3.87–5.11)
RDW: 14.6 % (ref 11.5–15.5)
WBC: 4 10*3/uL (ref 4.0–10.5)
nRBC: 0 % (ref 0.0–0.2)

## 2021-01-04 LAB — IRON AND TIBC
Iron: 31 ug/dL (ref 28–170)
Saturation Ratios: 15 % (ref 10.4–31.8)
TIBC: 200 ug/dL — ABNORMAL LOW (ref 250–450)
UIBC: 169 ug/dL

## 2021-01-04 LAB — GLUCOSE, CAPILLARY
Glucose-Capillary: 111 mg/dL — ABNORMAL HIGH (ref 70–99)
Glucose-Capillary: 102 mg/dL — ABNORMAL HIGH (ref 70–99)
Glucose-Capillary: 161 mg/dL — ABNORMAL HIGH (ref 70–99)

## 2021-01-04 LAB — PHOSPHORUS: Phosphorus: 8.5 mg/dL — ABNORMAL HIGH (ref 2.5–4.6)

## 2021-01-04 LAB — FERRITIN: Ferritin: 83 ng/mL (ref 11–307)

## 2021-01-04 NOTE — Anesthesia Preprocedure Evaluation (Addendum)
Anesthesia Evaluation  Patient identified by MRN, date of birth, ID bandGeneral Assessment Comment: Drowsy, but arousable   Reviewed: Allergy & Precautions, NPO status , Patient's Chart, lab work & pertinent test results, reviewed documented beta blocker date and time   History of Anesthesia Complications Negative for: history of anesthetic complications  Airway Mallampati: IV  TM Distance: >3 FB Neck ROM: Full   Comment: Poor effort opening mouth, suspect wider opening possible  Dental  (+) Dental Advisory Given   Pulmonary sleep apnea and Continuous Positive Airway Pressure Ventilation , Current Smoker and Patient abstained from smoking.,    Pulmonary exam normal        Cardiovascular hypertension, Pt. on medications and Pt. on home beta blockers Normal cardiovascular exam   '21 TTE - EF 60 to 65%. LA and RA were mildly dilated. Trivial MR.    Neuro/Psych PSYCHIATRIC DISORDERS Anxiety Depression Bipolar Disorder  Neuromuscular disease    GI/Hepatic Neg liver ROS, GERD  Controlled,  Endo/Other  diabetes, Type 2, Insulin DependentHypothyroidism Morbid obesity Na 133 Ca 7.7   Renal/GU ESRFRenal disease     Musculoskeletal  (+) Fibromyalgia -  Abdominal (+) + obese,   Peds  Hematology negative hematology ROS (+) anemia ,   Anesthesia Other Findings HSV  Reproductive/Obstetrics                           Anesthesia Physical Anesthesia Plan  ASA: 3  Anesthesia Plan: General   Post-op Pain Management:    Induction: Intravenous  PONV Risk Score and Plan: 2 and Treatment may vary due to age or medical condition, Ondansetron, Dexamethasone and Midazolam  Airway Management Planned: LMA  Additional Equipment: None  Intra-op Plan:   Post-operative Plan: Extubation in OR  Informed Consent: I have reviewed the patients History and Physical, chart, labs and discussed the procedure  including the risks, benefits and alternatives for the proposed anesthesia with the patient or authorized representative who has indicated his/her understanding and acceptance.     Dental advisory given  Plan Discussed with: CRNA and Anesthesiologist  Anesthesia Plan Comments:        Anesthesia Quick Evaluation

## 2021-01-04 NOTE — Progress Notes (Signed)
Patient ID: [name] [name], female   DOB: 31-Jul-1975, 45 y.o.   MRN: [id number] [facility] Progress Note   Assessment/ Plan:   1. Acute kidney Injury on chronic kidney disease stage V: Database points to likely progression of chronic kidney disease stage V (proteinuric diabetic nephropathy) now to end-stage renal disease.  She is on schedule for placement of tunneled hemodialysis catheter as well as left arm fistula or graft today by vascular surgery followed by initiation of dialysis thereafter. 2.  Volume overload: Limited response to diuretic therapy so far, monitor with ultrafiltration/hemodialysis. 3.  Hyponatremia: Secondary to volume excess and declining renal function/impaired free water excretion.  Monitor with hemodialysis. 4.  Flank pain/pyuria/bladder wall thickening: Suspect to be consistent with cystitis/UTI-on antibiotic coverage with ceftriaxone-urine culture polymicrobial. 5.  Anemia of chronic disease: Check iron studies with labs tomorrow morning to decide on need for supplementation/ESA. 6.  CKD-MBD: Check phosphorus with labs tomorrow morning to decide on need for binder.  Subjective:   Reports some intermittent left lower quadrant discomfort "feels like a cramp".  Has had some constipation for the last couple weeks.  Denies any hematochezia or melena.  Informs me that her mother was [name] [name].   Objective:   BP 140/74   Pulse 92   Temp 98 F (36.7 C) (Oral)   Resp 16   Ht 5\' 9"  (1.753 m)   Wt (!) 137 kg   SpO2 99%   BMI 44.60 kg/m   Intake/Output Summary (Last 24 hours) at 01/04/2021 1030 Last data filed at 01/04/2021 0915 Gross per 24 hour  Intake 420 ml  Output 300 ml  Net 120 ml   Weight change: 5.82 kg  Physical Exam: Gen: Sitting up on the side of her bed, appears comfortable CVS: Pulse regular rhythm, normal rate, S1 and S2 with an ejection systolic murmur Resp: Clear to auscultation bilaterally, no distinct rales or  rhonchi Abd: Soft, obese, nontender, bowel sounds normal Ext: Status post left above-knee amputation, 1-2+ right lower extremity edema with 1-2+ hip edema.  Imaging: VAS [place] UPPER EXT VEIN MAPPING (PRE-OP AVF)  Result Date: 01/03/2021 UPPER EXTREMITY VEIN MAPPING Patient Name:  [name] [name]  Date of Exam:   01/03/2021 Medical Rec #: [id number]             Accession #:    [id number] Date of Birth: August 25, 1975             Patient Gender: F Patient Age:   33 years Exam Location:  [facility] Procedure:      VAS [place] UPPER EXT VEIN MAPPING (PRE-OP AVF) Referring Phys: Roney Jaffe --------------------------------------------------------------------------------  Indications: ESRD. Limitations: Body habitus, tissue properties, IV, bandages Comparison Study: No prior study on file Performing Technologist: Sharion Dove RVS  Examination Guidelines: A complete evaluation includes B-mode imaging, spectral Doppler, color Doppler, and power Doppler as needed of all accessible portions of each vessel. Bilateral testing is considered an integral part of a complete examination. Limited examinations for reoccurring indications may be performed as noted. +-----------------+-------------+----------+--------------+ Right Cephalic   Diameter (cm)Depth (cm)   Findings    +-----------------+-------------+----------+--------------+ Prox upper arm                          not visualized +-----------------+-------------+----------+--------------+ Mid upper arm                           not  visualized +-----------------+-------------+----------+--------------+ Dist upper arm                          not visualized +-----------------+-------------+----------+--------------+ Antecubital fossa    0.23        1.79                  +-----------------+-------------+----------+--------------+ Prox forearm         0.17        1.77                   +-----------------+-------------+----------+--------------+ Mid forearm          0.14        1.57                  +-----------------+-------------+----------+--------------+ Dist forearm                            not visualized +-----------------+-------------+----------+--------------+ Wrist                                   not visualized +-----------------+-------------+----------+--------------+ Unable to visualize right basilic vein +-----------------+-------------+----------+----------------+ Left Cephalic    Diameter (cm)Depth (cm)    Findings     +-----------------+-------------+----------+----------------+ Prox upper arm       0.25        0.46                    +-----------------+-------------+----------+----------------+ Mid upper arm        0.25        0.67                    +-----------------+-------------+----------+----------------+ Dist upper arm       0.42        0.72   partial thrombus +-----------------+-------------+----------+----------------+ Antecubital fossa    0.67        0.30       thrombus     +-----------------+-------------+----------+----------------+ Prox forearm                             not visualized  +-----------------+-------------+----------+----------------+ Mid forearm                              not visualized  +-----------------+-------------+----------+----------------+ Dist forearm                             not visualized  +-----------------+-------------+----------+----------------+ Wrist                                    not visualized  +-----------------+-------------+----------+----------------+ +-----------------+-------------+----------+--------------+ Left Basilic     Diameter (cm)Depth (cm)   Findings    +-----------------+-------------+----------+--------------+ Prox upper arm       0.35        1.72       origin     +-----------------+-------------+----------+--------------+ Mid upper  arm        0.32        0.92                  +-----------------+-------------+----------+--------------+ Dist upper arm       0.25  0.75                  +-----------------+-------------+----------+--------------+ Antecubital fossa    0.13        0.11                  +-----------------+-------------+----------+--------------+ Prox forearm         0.09        1.06                  +-----------------+-------------+----------+--------------+ Mid forearm          0.07        0.18                  +-----------------+-------------+----------+--------------+ Distal forearm                          not visualized +-----------------+-------------+----------+--------------+ Wrist                                   not visualized +-----------------+-------------+----------+--------------+ *See table(s) above for measurements and observations.  Diagnosing physician:    Preliminary     Labs: BMET Recent Labs  Lab 12/31/20 1601 01/01/21 0049 01/01/21 0600 01/02/21 0553 01/03/21 0511 01/04/21 0853  NA 142  --  138 133* 134* 133*  K 5.3*  --  4.3 5.6* 4.8 4.9  CL 112*  --  110 106 105 105  CO2 19*  --  19* 18* 17* 18*  GLUCOSE 157*  --  110* 126* 117* 117*  BUN 68*  --  65* 74* 77* 85*  CREATININE 6.26* 6.58* 6.45* 7.51* 7.91* 8.49*  CALCIUM 8.9  --  7.9* 7.8* 7.7* 7.7*   CBC Recent Labs  Lab 12/31/20 1601 01/01/21 0049 01/01/21 0600 01/02/21 0553 01/03/21 0511 01/04/21 0853  WBC 8.2   < > 6.7 4.8 4.6 4.0  NEUTROABS 6.6  --   --   --   --   --   HGB 10.2*   < > 8.3* 8.2* 7.7* 8.5*  HCT 32.8*   < > 27.5* 27.5* 25.5* 27.9*  MCV 82.6   < > 84.6 87.3 85.3 84.5  PLT 280   < > 210 205 184 205   < > = values in this interval not displayed.    Medications:     amitriptyline  100 mg Oral QHS   amLODipine  10 mg Oral Daily   atorvastatin  20 mg Oral Daily   buPROPion  150 mg Oral Daily   Chlorhexidine Gluconate Cloth  6 each Topical Q0600   heparin  5,000  Units Subcutaneous Q8H   hydrALAZINE  50 mg Oral TID   hydrocerin   Topical BID   insulin aspart  0-6 Units Subcutaneous TID WC   levothyroxine  88 mcg Oral Daily   metoprolol succinate  25 mg Oral Daily   sodium bicarbonate  650 mg Oral BID   Elmarie Shiley, MD 01/04/2021, 10:30 AM

## 2021-01-04 NOTE — TOC Initial Note (Signed)
Transition of Care [facility]) - Initial/Assessment Note    Patient Details  Name: [name] [name] MRN: [id number] Date of Birth: 1976/01/02  Transition of Care [facility]) CM/SW Contact:    Tom-Johnson, Renea Ee, RN Phone Number: 01/04/2021, 4:36 PM  Clinical Narrative:                 Spoke with patient at bedside about TOC needs at discharge. Patient states she lives at home with God daughter. Uses Medicaid transportation to and from appointments and errands. Does have a wheelchair, walker, cane and left leg prosthesis. States the wheel on her wheelchair is broken and would like another.Patient had this wheelchair 4 years ago. Current smoker and smokes 10 sticks cigarette/day. No other needs noted. Will continue with TOC needs for discharge.   Expected Discharge Plan: Home/Self Care Barriers to Discharge: Continued Medical Work up   Patient Goals and CMS Choice Patient states their goals for this hospitalization and ongoing recovery are:: To go home.      Expected Discharge Plan and Services Expected Discharge Plan: Home/Self Care       Living arrangements for the past 2 months: Single Family Home                                      Prior Living Arrangements/Services Living arrangements for the past 2 months: Single Family Home Lives with:: Minor Children (God daughter.) Patient language and need for interpreter reviewed:: Yes Do you feel safe going back to the place where you live?: Yes      Need for Family Participation in Patient Care: Yes (Comment) Care giver support system in place?: Yes (comment) Current home services: DME [place] Rile, [place], Wheelchair, Lt leg prosthesis.) Criminal Activity/Legal Involvement Pertinent to Current Situation/Hospitalization: No - Comment as needed  Activities of Daily Living Home Assistive Devices/Equipment: CBG Meter, Prosthesis, Walker (specify type), Wheelchair, Radio producer (specify quad or straight), Crutches (single point cane,  front wheeled walker) ADL Screening (condition at time of admission) Patient's cognitive ability adequate to safely complete daily activities?: Yes Is the patient deaf or have difficulty hearing?: No Does the patient have difficulty seeing, even when wearing glasses/contacts?: Yes Does the patient have difficulty concentrating, remembering, or making decisions?: No Patient able to express need for assistance with ADLs?: Yes Does the patient have difficulty dressing or bathing?: Yes Independently performs ADLs?: No Communication: Independent Dressing (OT): Needs assistance Is this a change from baseline?: Pre-admission baseline Grooming: Independent Feeding: Independent Bathing: Needs assistance Is this a change from baseline?: Pre-admission baseline Toileting: Independent with device (comment) In/Out Bed: Needs assistance Is this a change from baseline?: Pre-admission baseline Walks in Home: Independent with device (comment) Does the patient have difficulty walking or climbing stairs?: Yes (secindary to left BKA) Weakness of Legs: Right (Left BKA) Weakness of Arms/Hands: None  Permission Sought/Granted Permission sought to share information with : Case Manager Permission granted to share information with : Yes, Verbal Permission Granted              Emotional Assessment Appearance:: Appears stated age Attitude/Demeanor/Rapport: Engaged Affect (typically observed): Accepting Orientation: : Oriented to Self, Oriented to Place, Oriented to  Time, Oriented to Situation Alcohol / Substance Use: Tobacco Use (Current smoker, 10 sticks/day.) Psych Involvement: No (comment)  Admission diagnosis:  ARF (acute renal failure) (HCC) [N17.9] Pyelonephritis [N12] Acute renal failure (ARF) (HCC) [N17.9] Uncontrolled hypertension [I10] AKI (acute kidney injury) (  [place]) [N17.9] Patient Active Problem List   Diagnosis Date Noted   Acute renal failure (ARF) ([place]) 01/01/2021   Pyelonephritis  12/31/2020   ARF (acute renal failure) ([place]) 12/31/2020   Hypertensive urgency 12/31/2020   Muscle spasm 12/16/2020   Cough 05/26/2020   Chronic pain syndrome 01/16/2020   Hx of AKA (above knee amputation), left (HCC)    Acute respiratory failure with hypoxia ([place]) 10/21/2019   Blurry vision, right eye 10/08/2019   Tobacco use 03/01/2019   Phantom limb pain ([place]) 11/08/2018   Current moderate episode of major depressive disorder ([place])    Hyperkalemia    Anemia of chronic disease    Fall    Status post below knee amputation, left ([place]) 04/11/2017   Bipolar affective disorder ([place])    Heart failure with preserved ejection fraction ([place]) 02/09/2017   Routine adult health maintenance 12/08/2016   Chronic kidney disease (CKD), stage III (moderate) ([place]) 07/15/2016   OSA (obstructive sleep apnea) 05/13/2016   Vaginal irritation 03/14/2016   Morbid obesity due to excess calories ([place]) 11/27/2015   Subclinical hypothyroidism 04/25/2013   Controlled type 2 diabetes mellitus ([place]) 07/12/2012   Carpal tunnel syndrome, bilateral 01/25/2012   Diabetic polyneuropathy associated with type 2 diabetes mellitus ([place]) 07/04/2011   Anxiety and depression 06/02/2011   GERD 12/06/2007   Hyperlipidemia 08/29/2007   Hypertension associated with diabetes ([place]) 08/29/2007   PCP:  Riesa Pope, MD Pharmacy:   [facility] [place], [place] - [address] [place] [address] Phone: [phone] Fax: [phone]     Social Determinants of Health ([place]) Interventions    Readmission Risk Interventions Readmission Risk Prevention Plan 10/31/2019 10/14/2019  Transportation Screening Complete Complete  PCP or Specialist Appt within 3-5 Days - Not Complete  Not Complete comments - plan for rehab  [place] or [place] - Complete  Social Work Consult for [place] Planning/Counseling - Complete  Palliative Care Screening - Not Applicable  Medication Review  (RN Care Manager) Complete Referral to Pharmacy  PCP or Specialist appointment within 3-5 days of discharge Complete -  [place] or Home Care Consult Complete -  SW Recovery Care/Counseling Consult Complete -  Palliative Care Screening Not Applicable -  [place] Not Applicable -  Some recent data might be hidden

## 2021-01-04 NOTE — Progress Notes (Addendum)
Patient ID: [name] [name], female   DOB: [birth year], 45 y.o.   MRN: [id number]  PROGRESS NOTE    [name]  IRW:[id number] DOB: 1976/04/27 DOA: 12/31/2020 PCP: Riesa Pope, MD   Brief Narrative:  45 year old female with history of chronic kidney disease stage IV, hypertension, diabetes mellitus type 2, tobacco abuse, anemia of chronic disease, chronic, left AKA, morbid obesity, chronic pain presented with nausea and vomiting with left flank pain.  On presentation, she was found to have worsening renal failure with metabolic acidosis and possible UTI.  CT of the abdomen and pelvis showed features concerning for cystitis, gallstones with no cholecystitis.  She was started on IV fluids and antibiotics.  Nephrology was consulted who recommended transfer patient to [facility] for possible need of hemodialysis.  Assessment & Plan:   AKI on CKD stage IV Acute metabolic acidosis -Nephrology following.  Patient has been transferred to [facility] for possible need of hemodialysis.  Creatinine 8.49, 7.51 on 01/02/2021 -Hold nephrotoxic medications including lisinopril and Bumex -Strict input and output.  Daily weights.  Fluid restriction.  Currently on Lasix 80 mg IV daily as per nephrology.  Nephrology planning to start dialysis. -Vascular surgery consulted by nephrology for dialysis access placement.   Hyperkalemia -Improved.  Hypertensive urgency -Blood pressure currently on the lower side.  Continue amlodipine, Lasix, metoprolol, hydralazine.  Bumex and lisinopril on hold   Suspect acute pyelonephritis -Urine culture grew multiple species.  Continue Rocephin   Hypothyroidism-continue Synthroid  Diabetes mellitus type 2 -A1c 6 in April 2022.  Continue CBGs with SSI   OSA not using CPAP -Apparently patient had stated that CPAP did not work for her  History of left AKA  Morbid obesity -Outpatient follow-up  History of chronic pain on chronic opiate  use -Continue as needed opiates.  Outpatient follow-up with PCP/pain management   Anemia of chronic disease  -hemoglobin stable.  No signs of bleeding.  Monitor   DVT prophylaxis: Heparin Code Status: Full Family Communication: None at bedside Disposition Plan: Status is: Inpatient   Remains inpatient appropriate because:Inpatient level of care appropriate due to severity of illness   Dispo: The patient is from: Home              Anticipated d/c is to: Home              Patient currently is not medically stable to d/c.              Difficult to place patient No   Consultants: Nephrology/vascular surgery   Procedures: None   Antimicrobials: Rocephin since admission  Subjective: Patient seen and examined at bedside.  Still has some back pain and poor appetite but denies worsening shortness of breath, nausea, vomiting or fever.  Objective: Vitals:   01/03/21 2023 01/03/21 2109 01/04/21 0537 01/04/21 0956  BP: (!) 91/48 127/71 121/71 140/74  Pulse: 82 84 82 92  Resp: 16  16 16   Temp: 97.7 F (36.5 C)  97.8 F (36.6 C) 98 F (36.7 C)  TempSrc: Oral  Oral Oral  SpO2: 94% 98% 98% 99%  Weight:   (!) 137 kg   Height:        Intake/Output Summary (Last 24 hours) at 01/04/2021 1021 Last data filed at 01/04/2021 0915 Gross per 24 hour  Intake 420 ml  Output 300 ml  Net 120 ml    Filed Weights   01/02/21 0704 01/03/21 0518 01/04/21 0537  Weight: 131.2 kg (!) 137  kg (!) 137 kg    Examination:  General exam: No distress.  On room air currently. Respiratory system: Decreased breath sounds at bases bilaterally with some scattered crackles  cardiovascular system: Rate controlled, S1-S2 heard Gastrointestinal system: Abdomen is morbidly obese, distended slightly, soft and nontender.  Bowel sounds heard  extremities: Left AKA present; no right lower extremity cyanosis or clubbing  Central nervous system: Awake and alert.  No focal neurological deficits.  Moves  extremities Skin: No obvious ecchymosis/other lesions  psychiatry: Flat affect  Data Reviewed: I have personally reviewed following labs and imaging studies  CBC: Recent Labs  Lab 12/31/20 1601 01/01/21 0049 01/01/21 0600 01/02/21 0553 01/03/21 0511 01/04/21 0853  WBC 8.2 8.9 6.7 4.8 4.6 4.0  NEUTROABS 6.6  --   --   --   --   --   HGB 10.2* 9.4* 8.3* 8.2* 7.7* 8.5*  HCT 32.8* 30.0* 27.5* 27.5* 25.5* 27.9*  MCV 82.6 84.0 84.6 87.3 85.3 84.5  PLT 280 293 210 205 184 025    Basic Metabolic Panel: Recent Labs  Lab 12/31/20 1601 01/01/21 0049 01/01/21 0600 01/02/21 0553 01/03/21 0511 01/04/21 0853  NA 142  --  138 133* 134* 133*  K 5.3*  --  4.3 5.6* 4.8 4.9  CL 112*  --  110 106 105 105  CO2 19*  --  19* 18* 17* 18*  GLUCOSE 157*  --  110* 126* 117* 117*  BUN 68*  --  65* 74* 77* 85*  CREATININE 6.26* 6.58* 6.45* 7.51* 7.91* 8.49*  CALCIUM 8.9  --  7.9* 7.8* 7.7* 7.7*    GFR: Estimated Creatinine Clearance: 12.6 mL/min (A) (by C-G formula based on SCr of 8.49 mg/dL (H)). Liver Function Tests: Recent Labs  Lab 12/31/20 1601 01/01/21 0600  AST 10* 10*  ALT 9 9  ALKPHOS 75 60  BILITOT 0.5 0.5  PROT 7.6 6.1*  ALBUMIN 3.4* 2.7*    Recent Labs  Lab 12/31/20 1601  LIPASE 37    No results for input(s): AMMONIA in the last 168 hours. Coagulation Profile: No results for input(s): INR, PROTIME in the last 168 hours. Cardiac Enzymes: Recent Labs  Lab 01/01/21 0600  CKTOTAL 101    BNP (last 3 results) No results for input(s): PROBNP in the last 8760 hours. HbA1C: No results for input(s): HGBA1C in the last 72 hours. CBG: Recent Labs  Lab 01/02/21 2301 01/03/21 0801 01/03/21 1150 01/03/21 1613 01/04/21 0757  GLUCAP 131* 94 103* 129* 111*    Lipid Profile: No results for input(s): CHOL, HDL, LDLCALC, TRIG, CHOLHDL, LDLDIRECT in the last 72 hours. Thyroid Function Tests: No results for input(s): TSH, T4TOTAL, FREET4, T3FREE, THYROIDAB in the  last 72 hours. Anemia Panel: No results for input(s): VITAMINB12, FOLATE, FERRITIN, TIBC, IRON, RETICCTPCT in the last 72 hours. Sepsis Labs: No results for input(s): PROCALCITON, LATICACIDVEN in the last 168 hours.  Recent Results (from the past 240 hour(s))  Resp Panel by RT-PCR (Flu A&B, Covid) Nasopharyngeal Swab     Status: None   Collection Time: 12/31/20  6:24 PM   Specimen: Nasopharyngeal Swab; Nasopharyngeal(NP) swabs in vial transport medium  Result Value Ref Range Status   SARS Coronavirus 2 by RT PCR NEGATIVE NEGATIVE Final    Comment: (NOTE) SARS-CoV-2 target nucleic acids are NOT DETECTED.  The SARS-CoV-2 RNA is generally detectable in upper respiratory specimens during the acute phase of infection. The lowest concentration of SARS-CoV-2 viral copies this assay can detect is  138 copies/mL. A negative result does not preclude SARS-Cov-2 infection and should not be used as the sole basis for treatment or other patient management decisions. A negative result may occur with  improper specimen collection/handling, submission of specimen other than nasopharyngeal swab, presence of viral mutation(s) within the areas targeted by this assay, and inadequate number of viral copies(<138 copies/mL). A negative result must be combined with clinical observations, patient history, and epidemiological information. The expected result is Negative.  Fact Sheet for Patients:  EntrepreneurPulse.com.au  Fact Sheet for Healthcare Providers:  IncredibleEmployment.be  This test is no t yet approved or cleared by the [place] FDA and  has been authorized for detection and/or diagnosis of SARS-CoV-2 by FDA under an Emergency Use Authorization (EUA). This EUA will remain  in effect (meaning this test can be used) for the duration of the COVID-19 declaration under Section 564(b)(1) of the Act, 21 U.S.C.section 360bbb-3(b)(1), unless the authorization is  terminated  or revoked sooner.       Influenza A by PCR NEGATIVE NEGATIVE Final   Influenza B by PCR NEGATIVE NEGATIVE Final    Comment: (NOTE) The Xpert Xpress SARS-CoV-2/FLU/RSV plus assay is intended as an aid in the diagnosis of influenza from Nasopharyngeal swab specimens and should not be used as a sole basis for treatment. Nasal washings and aspirates are unacceptable for Xpert Xpress SARS-CoV-2/FLU/RSV testing.  Fact Sheet for Patients: EntrepreneurPulse.com.au  Fact Sheet for Healthcare Providers: IncredibleEmployment.be  This test is not yet approved or cleared by the [place] FDA and has been authorized for detection and/or diagnosis of SARS-CoV-2 by FDA under an Emergency Use Authorization (EUA). This EUA will remain in effect (meaning this test can be used) for the duration of the COVID-19 declaration under Section 564(b)(1) of the Act, 21 U.S.C. section 360bbb-3(b)(1), unless the authorization is terminated or revoked.  Performed at [facility], [place] [address] [place], [place] [address]   Urine Culture     Status: Abnormal   Collection Time: 01/01/21 12:49 AM   Specimen: Urine, Clean Catch  Result Value Ref Range Status   Specimen Description   Final    URINE, CLEAN CATCH Performed at [facility], [place] [address], [place], [place] [address]    Special Requests   Final    NONE Performed at [facility], [place] [address], [place], [place] [address]    Culture MULTIPLE SPECIES PRESENT, SUGGEST RECOLLECTION (A)  Final   Report Status 01/02/2021 FINAL  Final          Radiology Studies: VAS [place] UPPER EXT VEIN MAPPING (PRE-OP AVF)  Result Date: 01/03/2021 UPPER EXTREMITY VEIN MAPPING Patient Name:  RHYLI DEPAULA  Date of Exam:   01/03/2021 Medical Rec #: [id number]             Accession #:    [id number] Date of Birth: 1976-02-23             Patient Gender:  F Patient Age:   59 years Exam Location:  [facility] Procedure:      VAS [place] UPPER EXT VEIN MAPPING (PRE-OP AVF) Referring Phys: Roney Jaffe --------------------------------------------------------------------------------  Indications: ESRD. Limitations: Body habitus, tissue properties, IV, bandages Comparison Study: No prior study on file Performing Technologist: Sharion Dove RVS  Examination Guidelines: A complete evaluation includes B-mode imaging, spectral Doppler, color Doppler, and power Doppler as needed of all accessible portions of each vessel. Bilateral testing is considered an integral part of  a complete examination. Limited examinations for reoccurring indications may be performed as noted. +-----------------+-------------+----------+--------------+ Right Cephalic   Diameter (cm)Depth (cm)   Findings    +-----------------+-------------+----------+--------------+ Prox upper arm                          not visualized +-----------------+-------------+----------+--------------+ Mid upper arm                           not visualized +-----------------+-------------+----------+--------------+ Dist upper arm                          not visualized +-----------------+-------------+----------+--------------+ Antecubital fossa    0.23        1.79                  +-----------------+-------------+----------+--------------+ Prox forearm         0.17        1.77                  +-----------------+-------------+----------+--------------+ Mid forearm          0.14        1.57                  +-----------------+-------------+----------+--------------+ Dist forearm                            not visualized +-----------------+-------------+----------+--------------+ Wrist                                   not visualized +-----------------+-------------+----------+--------------+ Unable to visualize right basilic vein  +-----------------+-------------+----------+----------------+ Left Cephalic    Diameter (cm)Depth (cm)    Findings     +-----------------+-------------+----------+----------------+ Prox upper arm       0.25        0.46                    +-----------------+-------------+----------+----------------+ Mid upper arm        0.25        0.67                    +-----------------+-------------+----------+----------------+ Dist upper arm       0.42        0.72   partial thrombus +-----------------+-------------+----------+----------------+ Antecubital fossa    0.67        0.30       thrombus     +-----------------+-------------+----------+----------------+ Prox forearm                             not visualized  +-----------------+-------------+----------+----------------+ Mid forearm                              not visualized  +-----------------+-------------+----------+----------------+ Dist forearm                             not visualized  +-----------------+-------------+----------+----------------+ Wrist                                    not visualized  +-----------------+-------------+----------+----------------+ +-----------------+-------------+----------+--------------+ Left Basilic  Diameter (cm)Depth (cm)   Findings    +-----------------+-------------+----------+--------------+ Prox upper arm       0.35        1.72       origin     +-----------------+-------------+----------+--------------+ Mid upper arm        0.32        0.92                  +-----------------+-------------+----------+--------------+ Dist upper arm       0.25        0.75                  +-----------------+-------------+----------+--------------+ Antecubital fossa    0.13        0.11                  +-----------------+-------------+----------+--------------+ Prox forearm         0.09        1.06                   +-----------------+-------------+----------+--------------+ Mid forearm          0.07        0.18                  +-----------------+-------------+----------+--------------+ Distal forearm                          not visualized +-----------------+-------------+----------+--------------+ Wrist                                   not visualized +-----------------+-------------+----------+--------------+ *See table(s) above for measurements and observations.  Diagnosing physician:    Preliminary         Scheduled Meds:  amitriptyline  100 mg Oral QHS   amLODipine  10 mg Oral Daily   atorvastatin  20 mg Oral Daily   buPROPion  150 mg Oral Daily   Chlorhexidine Gluconate Cloth  6 each Topical Q0600   heparin  5,000 Units Subcutaneous Q8H   hydrALAZINE  50 mg Oral TID   hydrocerin   Topical BID   insulin aspart  0-6 Units Subcutaneous TID WC   levothyroxine  88 mcg Oral Daily   metoprolol succinate  25 mg Oral Daily   sodium bicarbonate  650 mg Oral BID   Continuous Infusions:  cefTRIAXone (ROCEPHIN)  IV 2 g (01/03/21 2135)          Aline August, MD [facility] 01/04/2021, 10:21 AM

## 2021-01-04 NOTE — Consult Note (Signed)
   [facility] CM Inpatient Consult   01/04/2021  [name] [name] [birth year] [id number]  [employer] [ACO] Patient: [name]  Primary Care Provider:  Riesa Pope, MD, is an Embedded provider  Patient was screened for high risk score and found patient hs her care management with her [facility] Medicare Special Needs Program [SNP] plan.  Will sign off.  Natividad Brood, RN BSN [facility] Liaison  [phone] business mobile phone Toll free office [phone]  Fax number: [phone] [email or web address]

## 2021-01-05 ENCOUNTER — Encounter (HOSPITAL_COMMUNITY)
Admission: EM | Disposition: A | Discharge status: Home / Self Care | Payer: Self-pay | Source: Home / Self Care | Attending: Family Medicine

## 2021-01-05 ENCOUNTER — Inpatient Hospital Stay (HOSPITAL_COMMUNITY): Payer: Medicare HMO

## 2021-01-05 ENCOUNTER — Inpatient Hospital Stay (HOSPITAL_COMMUNITY): Payer: Medicare HMO | Admitting: Anesthesiology

## 2021-01-05 DIAGNOSIS — I160 Hypertensive urgency: Secondary | ICD-10-CM | POA: Diagnosis not present

## 2021-01-05 DIAGNOSIS — I82622 Acute embolism and thrombosis of deep veins of left upper extremity: Secondary | ICD-10-CM

## 2021-01-05 DIAGNOSIS — N186 End stage renal disease: Secondary | ICD-10-CM

## 2021-01-05 DIAGNOSIS — Z992 Dependence on renal dialysis: Secondary | ICD-10-CM

## 2021-01-05 DIAGNOSIS — N12 Tubulo-interstitial nephritis, not specified as acute or chronic: Secondary | ICD-10-CM | POA: Diagnosis not present

## 2021-01-05 DIAGNOSIS — N184 Chronic kidney disease, stage 4 (severe): Secondary | ICD-10-CM | POA: Diagnosis not present

## 2021-01-05 DIAGNOSIS — N179 Acute kidney failure, unspecified: Secondary | ICD-10-CM | POA: Diagnosis not present

## 2021-01-05 DIAGNOSIS — G4733 Obstructive sleep apnea (adult) (pediatric): Secondary | ICD-10-CM

## 2021-01-05 HISTORY — PX: AV FISTULA PLACEMENT: SHX1204

## 2021-01-05 HISTORY — PX: INSERTION OF DIALYSIS CATHETER: SHX1324

## 2021-01-05 LAB — CBC
HCT: 25.3 % — ABNORMAL LOW (ref 36.0–46.0)
Hemoglobin: 7.8 g/dL — ABNORMAL LOW (ref 12.0–15.0)
MCH: 26 pg (ref 26.0–34.0)
MCHC: 30.8 g/dL (ref 30.0–36.0)
MCV: 84.3 fL (ref 80.0–100.0)
Platelets: 199 10*3/uL (ref 150–400)
RBC: 3 MIL/uL — ABNORMAL LOW (ref 3.87–5.11)
RDW: 14.5 % (ref 11.5–15.5)
WBC: 7.1 10*3/uL (ref 4.0–10.5)
nRBC: 0 % (ref 0.0–0.2)

## 2021-01-05 LAB — GLUCOSE, CAPILLARY
Glucose-Capillary: 135 mg/dL — ABNORMAL HIGH (ref 70–99)
Glucose-Capillary: 161 mg/dL — ABNORMAL HIGH (ref 70–99)
Glucose-Capillary: 111 mg/dL — ABNORMAL HIGH (ref 70–99)
Glucose-Capillary: 225 mg/dL — ABNORMAL HIGH (ref 70–99)

## 2021-01-05 SURGERY — INSERTION OF DIALYSIS CATHETER
Anesthesia: General | Site: Chest | Laterality: Right

## 2021-01-05 MED ORDER — HEPARIN SODIUM (PORCINE) 1000 UNIT/ML IJ SOLN
INTRAMUSCULAR | Status: AC
  Filled 2021-01-05: qty 1

## 2021-01-05 MED ORDER — PROPOFOL 10 MG/ML IV BOLUS
INTRAVENOUS | Status: DC | PRN
  Administered 2021-01-05: 20 mg via INTRAVENOUS
  Administered 2021-01-05: 130 mg via INTRAVENOUS

## 2021-01-05 MED ORDER — PHENYLEPHRINE HCL-NACL 20-0.9 MG/250ML-% IV SOLN
INTRAVENOUS | Status: DC | PRN
  Administered 2021-01-05: 25 ug/min via INTRAVENOUS

## 2021-01-05 MED ORDER — PROTAMINE SULFATE 10 MG/ML IV SOLN
INTRAVENOUS | Status: DC | PRN
  Administered 2021-01-05 (×5): 10 mg via INTRAVENOUS
  Administered 2021-01-05: 20 mg via INTRAVENOUS

## 2021-01-05 MED ORDER — FENTANYL CITRATE (PF) 100 MCG/2ML IJ SOLN: 25.0000 ug | INTRAMUSCULAR | Status: DC | PRN

## 2021-01-05 MED ORDER — SODIUM CHLORIDE 0.9 % IV SOLN
250.0000 mg | Freq: Every day | INTRAVENOUS | Status: AC
  Administered 2021-01-05 – 2021-01-08 (×4): 250 mg via INTRAVENOUS
  Filled 2021-01-05 (×6): qty 20

## 2021-01-05 MED ORDER — OXYCODONE-ACETAMINOPHEN 5-325 MG PO TABS: 1.0000 | ORAL_TABLET | Freq: Four times a day (QID) | ORAL | Status: DC | PRN

## 2021-01-05 MED ORDER — VASOPRESSIN 20 UNIT/ML IV SOLN
INTRAVENOUS | Status: DC | PRN
  Administered 2021-01-05: 1 [IU] via INTRAVENOUS
  Administered 2021-01-05 (×5): 2 [IU] via INTRAVENOUS
  Administered 2021-01-05: 1 [IU] via INTRAVENOUS
  Administered 2021-01-05 (×2): 2 [IU] via INTRAVENOUS
  Administered 2021-01-05: 1 [IU] via INTRAVENOUS
  Administered 2021-01-05: 2 [IU] via INTRAVENOUS

## 2021-01-05 MED ORDER — SODIUM CHLORIDE 0.9 % IV SOLN: INTRAVENOUS | Status: DC | PRN

## 2021-01-05 MED ORDER — LIDOCAINE-EPINEPHRINE (PF) 1 %-1:200000 IJ SOLN
INTRAMUSCULAR | Status: AC
  Filled 2021-01-05: qty 30

## 2021-01-05 MED ORDER — DEXAMETHASONE SODIUM PHOSPHATE 10 MG/ML IJ SOLN
INTRAMUSCULAR | Status: DC | PRN
  Administered 2021-01-05: 4 mg via INTRAVENOUS

## 2021-01-05 MED ORDER — PHENYLEPHRINE 40 MCG/ML (10ML) SYRINGE FOR IV PUSH (FOR BLOOD PRESSURE SUPPORT)
PREFILLED_SYRINGE | INTRAVENOUS | Status: DC | PRN
  Administered 2021-01-05 (×2): 80 ug via INTRAVENOUS
  Administered 2021-01-05: 120 ug via INTRAVENOUS

## 2021-01-05 MED ORDER — FENTANYL CITRATE (PF) 250 MCG/5ML IJ SOLN
INTRAMUSCULAR | Status: DC | PRN
  Administered 2021-01-05: 50 ug via INTRAVENOUS

## 2021-01-05 MED ORDER — CALCIUM ACETATE (PHOS BINDER) 667 MG PO CAPS
1334.0000 mg | ORAL_CAPSULE | Freq: Three times a day (TID) | ORAL | Status: DC
  Administered 2021-01-05 – 2021-01-12 (×17): 1334 mg via ORAL
  Filled 2021-01-05 (×18): qty 2

## 2021-01-05 MED ORDER — CEFAZOLIN SODIUM-DEXTROSE 2-4 GM/100ML-% IV SOLN
INTRAVENOUS | Status: AC
  Filled 2021-01-05: qty 100

## 2021-01-05 MED ORDER — LIDOCAINE 2% (20 MG/ML) 5 ML SYRINGE
INTRAMUSCULAR | Status: AC
  Filled 2021-01-05: qty 5

## 2021-01-05 MED ORDER — SODIUM CHLORIDE 0.9% FLUSH
10.0000 mL | INTRAVENOUS | Status: DC | PRN
  Administered 2021-01-07 – 2021-01-11 (×4): 10 mL

## 2021-01-05 MED ORDER — HEPARIN 6000 UNIT IRRIGATION SOLUTION
Status: DC | PRN
  Administered 2021-01-05: 1

## 2021-01-05 MED ORDER — HEPARIN SODIUM (PORCINE) 1000 UNIT/ML IJ SOLN
INTRAMUSCULAR | Status: DC | PRN
  Administered 2021-01-05: 10000 [IU] via INTRAVENOUS

## 2021-01-05 MED ORDER — EPHEDRINE SULFATE-NACL 50-0.9 MG/10ML-% IV SOSY
PREFILLED_SYRINGE | INTRAVENOUS | Status: DC | PRN
  Administered 2021-01-05 (×2): 10 mg via INTRAVENOUS

## 2021-01-05 MED ORDER — OXYCODONE-ACETAMINOPHEN 5-325 MG PO TABS
1.0000 | ORAL_TABLET | ORAL | Status: DC | PRN
  Administered 2021-01-05: 1 via ORAL
  Administered 2021-01-05: 2 via ORAL
  Administered 2021-01-05: 1 via ORAL
  Administered 2021-01-06 – 2021-01-11 (×12): 2 via ORAL
  Filled 2021-01-05 (×2): qty 2
  Filled 2021-01-05: qty 1
  Filled 2021-01-05: qty 2
  Filled 2021-01-05 (×2): qty 1
  Filled 2021-01-05 (×12): qty 2

## 2021-01-05 MED ORDER — ONDANSETRON HCL 4 MG/2ML IJ SOLN
INTRAMUSCULAR | Status: AC
  Filled 2021-01-05: qty 2

## 2021-01-05 MED ORDER — PROMETHAZINE HCL 25 MG/ML IJ SOLN: 6.2500 mg | INTRAMUSCULAR | Status: DC | PRN

## 2021-01-05 MED ORDER — DEXAMETHASONE SODIUM PHOSPHATE 10 MG/ML IJ SOLN
INTRAMUSCULAR | Status: AC
  Filled 2021-01-05: qty 1

## 2021-01-05 MED ORDER — HEMOSTATIC AGENTS (NO CHARGE) OPTIME
TOPICAL | Status: DC | PRN
  Administered 2021-01-05: 1 via TOPICAL

## 2021-01-05 MED ORDER — HYDROMORPHONE HCL 1 MG/ML IJ SOLN
0.5000 mg | Freq: Four times a day (QID) | INTRAMUSCULAR | Status: DC | PRN
  Administered 2021-01-05 – 2021-01-12 (×9): 0.5 mg via INTRAVENOUS
  Filled 2021-01-05 (×8): qty 1

## 2021-01-05 MED ORDER — ONDANSETRON HCL 4 MG/2ML IJ SOLN
INTRAMUSCULAR | Status: DC | PRN
  Administered 2021-01-05: 4 mg via INTRAVENOUS

## 2021-01-05 MED ORDER — VASOPRESSIN 20 UNIT/ML IV SOLN
INTRAVENOUS | Status: AC
  Filled 2021-01-05: qty 1

## 2021-01-05 MED ORDER — 0.9 % SODIUM CHLORIDE (POUR BTL) OPTIME
TOPICAL | Status: DC | PRN
  Administered 2021-01-05: 1000 mL

## 2021-01-05 MED ORDER — OXYCODONE HCL 5 MG PO TABS: 5.0000 mg | ORAL_TABLET | Freq: Once | ORAL | Status: DC | PRN

## 2021-01-05 MED ORDER — HEPARIN 6000 UNIT IRRIGATION SOLUTION
Status: AC
  Filled 2021-01-05: qty 500

## 2021-01-05 MED ORDER — MIDAZOLAM HCL 2 MG/2ML IJ SOLN
INTRAMUSCULAR | Status: AC
  Filled 2021-01-05: qty 2

## 2021-01-05 MED ORDER — PROPOFOL 10 MG/ML IV BOLUS
INTRAVENOUS | Status: AC
  Filled 2021-01-05: qty 20

## 2021-01-05 MED ORDER — CEFAZOLIN SODIUM-DEXTROSE 2-3 GM-%(50ML) IV SOLR
INTRAVENOUS | Status: DC | PRN
  Administered 2021-01-05: 2 g via INTRAVENOUS

## 2021-01-05 MED ORDER — LIDOCAINE HCL 1 % IJ SOLN
INTRAMUSCULAR | Status: AC
  Filled 2021-01-05: qty 20

## 2021-01-05 MED ORDER — PHENYLEPHRINE 40 MCG/ML (10ML) SYRINGE FOR IV PUSH (FOR BLOOD PRESSURE SUPPORT)
PREFILLED_SYRINGE | INTRAVENOUS | Status: AC
  Filled 2021-01-05: qty 10

## 2021-01-05 MED ORDER — OXYCODONE HCL 5 MG/5ML PO SOLN: 5.0000 mg | Freq: Once | ORAL | Status: DC | PRN

## 2021-01-05 MED ORDER — DARBEPOETIN ALFA 100 MCG/0.5ML IJ SOSY
100.0000 ug | PREFILLED_SYRINGE | INTRAMUSCULAR | Status: DC
  Administered 2021-01-06: 100 ug via INTRAVENOUS
  Filled 2021-01-05 (×2): qty 0.5

## 2021-01-05 MED ORDER — HEPARIN SODIUM (PORCINE) 1000 UNIT/ML IJ SOLN
INTRAMUSCULAR | Status: DC | PRN
  Administered 2021-01-05: 3800 [IU]

## 2021-01-05 MED ORDER — FENTANYL CITRATE (PF) 250 MCG/5ML IJ SOLN
INTRAMUSCULAR | Status: AC
  Filled 2021-01-05: qty 5

## 2021-01-05 SURGICAL SUPPLY — 57 items
ADH SKN CLS APL DERMABOND .7 (GAUZE/BANDAGES/DRESSINGS) ×2
AGENT HMST 10 BLLW SHRT CANN (HEMOSTASIS) ×2
APL PRP STRL LF DISP 70% ISPRP (MISCELLANEOUS) ×2
ARMBAND PINK RESTRICT EXTREMIT (MISCELLANEOUS) ×6 IMPLANT
BAG COUNTER SPONGE SURGICOUNT (BAG) ×3 IMPLANT
BAG DECANTER FOR FLEXI CONT (MISCELLANEOUS) ×3 IMPLANT
BAG SPNG CNTER NS LX DISP (BAG) ×2
BIOPATCH RED 1 DISK 7.0 (GAUZE/BANDAGES/DRESSINGS) ×3 IMPLANT
CANISTER SUCT 3000ML PPV (MISCELLANEOUS) ×3 IMPLANT
CANNULA VESSEL 3MM 2 BLNT TIP (CANNULA) ×3 IMPLANT
CATH EMB 4FR 40CM (CATHETERS) ×1 IMPLANT
CATH PALINDROME-P 23CM W/VT (CATHETERS) ×1 IMPLANT
CHLORAPREP W/TINT 26 (MISCELLANEOUS) ×3 IMPLANT
CLIP VESOCCLUDE MED 6/CT (CLIP) ×3 IMPLANT
CLIP VESOCCLUDE SM WIDE 6/CT (CLIP) ×3 IMPLANT
COVER PROBE W GEL 5X96 (DRAPES) ×1 IMPLANT
COVER SURGICAL LIGHT HANDLE (MISCELLANEOUS) ×3 IMPLANT
DERMABOND ADVANCED (GAUZE/BANDAGES/DRESSINGS) ×1
DERMABOND ADVANCED .7 DNX12 (GAUZE/BANDAGES/DRESSINGS) ×2 IMPLANT
DRAPE C-ARM 42X72 X-RAY (DRAPES) ×3 IMPLANT
DRAPE CHEST BREAST 15X10 FENES (DRAPES) ×3 IMPLANT
ELECT REM PT RETURN 9FT ADLT (ELECTROSURGICAL) ×3
ELECTRODE REM PT RTRN 9FT ADLT (ELECTROSURGICAL) ×2 IMPLANT
GAUZE 4X4 16PLY ~~LOC~~+RFID DBL (SPONGE) ×4 IMPLANT
GLOVE SRG 8 PF TXTR STRL LF DI (GLOVE) ×2 IMPLANT
GLOVE SURG ENC MOIS LTX SZ7.5 (GLOVE) ×3 IMPLANT
GLOVE SURG UNDER POLY LF SZ8 (GLOVE) ×3
GOWN STRL REUS W/ TWL LRG LVL3 (GOWN DISPOSABLE) ×6 IMPLANT
GOWN STRL REUS W/TWL LRG LVL3 (GOWN DISPOSABLE) ×9
HEMOSTAT HEMOBLAST BELLOWS (HEMOSTASIS) ×1 IMPLANT
KIT BASIN OR (CUSTOM PROCEDURE TRAY) ×3 IMPLANT
KIT MICROPUNCTURE NIT STIFF (SHEATH) ×1 IMPLANT
KIT TURNOVER KIT B (KITS) ×3 IMPLANT
NDL 18GX1X1/2 (RX/OR ONLY) (NEEDLE) ×2 IMPLANT
NDL HYPO 25GX1X1/2 BEV (NEEDLE) ×2 IMPLANT
NEEDLE 18GX1X1/2 (RX/OR ONLY) (NEEDLE) ×3 IMPLANT
NEEDLE HYPO 25GX1X1/2 BEV (NEEDLE) ×3 IMPLANT
NS IRRIG 1000ML POUR BTL (IV SOLUTION) ×3 IMPLANT
PACK CV ACCESS (CUSTOM PROCEDURE TRAY) ×3 IMPLANT
PACK SURGICAL SETUP 50X90 (CUSTOM PROCEDURE TRAY) ×3 IMPLANT
PAD ARMBOARD 7.5X6 YLW CONV (MISCELLANEOUS) ×6 IMPLANT
SPONGE SURGIFOAM ABS GEL 100 (HEMOSTASIS) IMPLANT
SPONGE T-LAP 18X18 ~~LOC~~+RFID (SPONGE) ×1 IMPLANT
STOPCOCK 4 WAY LG BORE MALE ST (IV SETS) ×1 IMPLANT
SUT ETHILON 3 0 PS 1 (SUTURE) ×3 IMPLANT
SUT MNCRL AB 4-0 PS2 18 (SUTURE) ×4 IMPLANT
SUT PROLENE 6 0 BV (SUTURE) ×5 IMPLANT
SUT VIC AB 3-0 SH 27 (SUTURE) ×3
SUT VIC AB 3-0 SH 27X BRD (SUTURE) ×2 IMPLANT
SYR 10ML LL (SYRINGE) ×4 IMPLANT
SYR 20ML LL LF (SYRINGE) ×6 IMPLANT
SYR 5ML LL (SYRINGE) ×6 IMPLANT
SYR CONTROL 10ML LL (SYRINGE) ×3 IMPLANT
TOWEL GREEN STERILE (TOWEL DISPOSABLE) ×6 IMPLANT
TOWEL GREEN STERILE FF (TOWEL DISPOSABLE) ×3 IMPLANT
UNDERPAD 30X36 HEAVY ABSORB (UNDERPADS AND DIAPERS) ×3 IMPLANT
WATER STERILE IRR 1000ML POUR (IV SOLUTION) ×3 IMPLANT

## 2021-01-05 NOTE — Transfer of Care (Signed)
Immediate Anesthesia Transfer of Care Note  Patient: [place]  Procedure(s) Performed: INSERTION OF DIALYSIS CATHETER (Right: Chest) ARTERIOVENOUS (AV) FISTULA CREATION vs. GRAFT (Left: Arm Upper)  Patient Location: PACU  Anesthesia Type:General  Level of Consciousness: drowsy and patient cooperative  Airway & Oxygen Therapy: Patient Spontanous Breathing and Patient connected to face mask oxygen  Post-op Assessment: Report given to RN and Post -op Vital signs reviewed and stable  Post vital signs: Reviewed and stable  Last Vitals:  Vitals Value Taken Time  BP 126/67 01/05/21 1027  Temp    Pulse 82 01/05/21 1034  Resp 24 01/05/21 1034  SpO2 100 % 01/05/21 1034  Vitals shown include unvalidated device data.  Last Pain:  Vitals:   01/05/21 0540  TempSrc: Oral  PainSc:       Patients Stated Pain Goal: 0 (44/46/19 0122)  Complications: No notable events documented.

## 2021-01-05 NOTE — Progress Notes (Signed)
Patient ID: CHENELLE BENNING, female   DOB: 25-Mar-1976, 45 y.o.   MRN: [id number]  PROGRESS NOTE    DANYELLE BROOKOVER  GXQ:[id number] DOB: Jul 17, 1975 DOA: 12/31/2020 PCP: Riesa Pope, MD   Brief Narrative:  45 year old female with history of chronic kidney disease stage IV, hypertension, diabetes mellitus type 2, tobacco abuse, anemia of chronic disease, chronic, left AKA, morbid obesity, chronic pain presented with nausea and vomiting with left flank pain.  On presentation, she was found to have worsening renal failure with metabolic acidosis and possible UTI.  CT of the abdomen and pelvis showed features concerning for cystitis, gallstones with no cholecystitis.  She was started on IV fluids and antibiotics.  Nephrology was consulted who recommended transfer patient to [facility] for possible need of hemodialysis.  Assessment & Plan:   AKI on CKD stage IV Acute metabolic acidosis -Nephrology following.  Patient has been transferred to [facility] for possible need of hemodialysis.  Creatinine 8.49 on 01/04/2021, 7.51 on 01/02/2021.  Labs pending for today. -Hold nephrotoxic medications including lisinopril and Bumex -Strict input and output.  Daily weights.  Fluid restriction.  Nephrology planning to start dialysis after dialysis access placement. -Vascular surgery following: Plan for dialysis access placement today.  Hyperkalemia -Improved.  Labs pending for today.  Hypertensive urgency -Blood pressure currently currently stable.  Continue amlodipine, metoprolol, hydralazine.  Bumex and lisinopril on hold   Suspect acute pyelonephritis -Urine culture grew multiple species.  Continue Rocephin.   Hypothyroidism-continue Synthroid  Diabetes mellitus type 2 -A1c 6 in April 2022.  Continue CBGs with SSI   OSA not using CPAP -Apparently patient had stated that CPAP did not work for her  History of left AKA  Morbid obesity -Outpatient follow-up  History of  chronic pain on chronic opiate use -Continue as needed opiates.  Outpatient follow-up with PCP/pain management   Anemia of chronic disease  -hemoglobin stable.  No signs of bleeding.  Monitor   DVT prophylaxis: Heparin Code Status: Full Family Communication: None at bedside Disposition Plan: Status is: Inpatient   Remains inpatient appropriate because:Inpatient level of care appropriate due to severity of illness   Dispo: The patient is from: Home              Anticipated d/c is to: Home              Patient currently is not medically stable to d/c.              Difficult to place patient No   Consultants: Nephrology/vascular surgery   Procedures: None   Antimicrobials: Rocephin since admission  Subjective: Patient seen and examined at bedside.  No overnight fever, vomiting, worsening shortness of breath.  Objective: Vitals:   01/05/21 0752 01/05/21 0753 01/05/21 0754 01/05/21 0755  BP: (!) 157/69     Pulse: 89 88 88 88  Resp: 12 17 18 15   Temp:      TempSrc:      SpO2: 96% 96% 97% 97%  Weight:      Height:        Intake/Output Summary (Last 24 hours) at 01/05/2021 0812 Last data filed at 01/04/2021 1803 Gross per 24 hour  Intake 580 ml  Output 900 ml  Net -320 ml    Filed Weights   01/02/21 0704 01/03/21 0518 01/04/21 0537  Weight: 131.2 kg (!) 137 kg (!) 137 kg    Examination:  General exam: Currently on room air.  No acute distress. Respiratory  system: Bilateral decreased breath sounds at bases with scattered crackles.   Cardiovascular system: S1-S2 heard; rate controlled  gastrointestinal system: Abdomen is morbidly obese, mildly distended; soft and nontender.  Normal bowel sounds are heard extremities: Trace right lower extremity edema present.  Left AKA present Central nervous system: Alert and oriented.  No focal neurological deficits.  Moving extremities Skin: No obvious petechiae/other rashes psychiatry: Affect is extremely flat  Data Reviewed:  I have personally reviewed following labs and imaging studies  CBC: Recent Labs  Lab 12/31/20 1601 01/01/21 0049 01/01/21 0600 01/02/21 0553 01/03/21 0511 01/04/21 0853  WBC 8.2 8.9 6.7 4.8 4.6 4.0  NEUTROABS 6.6  --   --   --   --   --   HGB 10.2* 9.4* 8.3* 8.2* 7.7* 8.5*  HCT 32.8* 30.0* 27.5* 27.5* 25.5* 27.9*  MCV 82.6 84.0 84.6 87.3 85.3 84.5  PLT 280 293 210 205 184 846    Basic Metabolic Panel: Recent Labs  Lab 12/31/20 1601 01/01/21 0049 01/01/21 0600 01/02/21 0553 01/03/21 0511 01/04/21 0853 01/04/21 1314  NA 142  --  138 133* 134* 133*  --   K 5.3*  --  4.3 5.6* 4.8 4.9  --   CL 112*  --  110 106 105 105  --   CO2 19*  --  19* 18* 17* 18*  --   GLUCOSE 157*  --  110* 126* 117* 117*  --   BUN 68*  --  65* 74* 77* 85*  --   CREATININE 6.26* 6.58* 6.45* 7.51* 7.91* 8.49*  --   CALCIUM 8.9  --  7.9* 7.8* 7.7* 7.7*  --   PHOS  --   --   --   --   --   --  8.5*    GFR: Estimated Creatinine Clearance: 12.6 mL/min (A) (by C-G formula based on SCr of 8.49 mg/dL (H)). Liver Function Tests: Recent Labs  Lab 12/31/20 1601 01/01/21 0600  AST 10* 10*  ALT 9 9  ALKPHOS 75 60  BILITOT 0.5 0.5  PROT 7.6 6.1*  ALBUMIN 3.4* 2.7*    Recent Labs  Lab 12/31/20 1601  LIPASE 37    No results for input(s): AMMONIA in the last 168 hours. Coagulation Profile: No results for input(s): INR, PROTIME in the last 168 hours. Cardiac Enzymes: Recent Labs  Lab 01/01/21 0600  CKTOTAL 101    BNP (last 3 results) No results for input(s): PROBNP in the last 8760 hours. HbA1C: No results for input(s): HGBA1C in the last 72 hours. CBG: Recent Labs  Lab 01/03/21 1150 01/03/21 1613 01/04/21 0757 01/04/21 1156 01/04/21 1648  GLUCAP 103* 129* 111* 102* 161*    Lipid Profile: No results for input(s): CHOL, HDL, LDLCALC, TRIG, CHOLHDL, LDLDIRECT in the last 72 hours. Thyroid Function Tests: No results for input(s): TSH, T4TOTAL, FREET4, T3FREE, THYROIDAB in the  last 72 hours. Anemia Panel: Recent Labs    01/04/21 1314  FERRITIN 83  TIBC 200*  IRON 31   Sepsis Labs: No results for input(s): PROCALCITON, LATICACIDVEN in the last 168 hours.  Recent Results (from the past 240 hour(s))  Resp Panel by RT-PCR (Flu A&B, Covid) Nasopharyngeal Swab     Status: None   Collection Time: 12/31/20  6:24 PM   Specimen: Nasopharyngeal Swab; Nasopharyngeal(NP) swabs in vial transport medium  Result Value Ref Range Status   SARS Coronavirus 2 by RT PCR NEGATIVE NEGATIVE Final    Comment: (NOTE) SARS-CoV-2 target nucleic  acids are NOT DETECTED.  The SARS-CoV-2 RNA is generally detectable in upper respiratory specimens during the acute phase of infection. The lowest concentration of SARS-CoV-2 viral copies this assay can detect is 138 copies/mL. A negative result does not preclude SARS-Cov-2 infection and should not be used as the sole basis for treatment or other patient management decisions. A negative result may occur with  improper specimen collection/handling, submission of specimen other than nasopharyngeal swab, presence of viral mutation(s) within the areas targeted by this assay, and inadequate number of viral copies(<138 copies/mL). A negative result must be combined with clinical observations, patient history, and epidemiological information. The expected result is Negative.  Fact Sheet for Patients:  EntrepreneurPulse.com.au  Fact Sheet for Healthcare Providers:  IncredibleEmployment.be  This test is no t yet approved or cleared by the [place] FDA and  has been authorized for detection and/or diagnosis of SARS-CoV-2 by FDA under an Emergency Use Authorization (EUA). This EUA will remain  in effect (meaning this test can be used) for the duration of the COVID-19 declaration under Section 564(b)(1) of the Act, 21 U.S.C.section 360bbb-3(b)(1), unless the authorization is terminated  or revoked  sooner.       Influenza A by PCR NEGATIVE NEGATIVE Final   Influenza B by PCR NEGATIVE NEGATIVE Final    Comment: (NOTE) The Xpert Xpress SARS-CoV-2/FLU/RSV plus assay is intended as an aid in the diagnosis of influenza from Nasopharyngeal swab specimens and should not be used as a sole basis for treatment. Nasal washings and aspirates are unacceptable for Xpert Xpress SARS-CoV-2/FLU/RSV testing.  Fact Sheet for Patients: EntrepreneurPulse.com.au  Fact Sheet for Healthcare Providers: IncredibleEmployment.be  This test is not yet approved or cleared by the [place] FDA and has been authorized for detection and/or diagnosis of SARS-CoV-2 by FDA under an Emergency Use Authorization (EUA). This EUA will remain in effect (meaning this test can be used) for the duration of the COVID-19 declaration under Section 564(b)(1) of the Act, 21 U.S.C. section 360bbb-3(b)(1), unless the authorization is terminated or revoked.  Performed at [facility], [place] [address], [place], [place] [address]   Urine Culture     Status: Abnormal   Collection Time: 01/01/21 12:49 AM   Specimen: Urine, Clean Catch  Result Value Ref Range Status   Specimen Description   Final    URINE, CLEAN CATCH Performed at [facility], [place] [address], [place], [place] [address]    Special Requests   Final    NONE Performed at [facility], [place] [address] [place], [place] [address]    Culture MULTIPLE SPECIES PRESENT, SUGGEST RECOLLECTION (A)  Final   Report Status 01/02/2021 FINAL  Final          Radiology Studies: VAS [place] UPPER EXT VEIN MAPPING (PRE-OP AVF)  Result Date: 01/04/2021 UPPER EXTREMITY VEIN MAPPING Patient Name:  SLOKA VOLANTE  Date of Exam:   01/03/2021 Medical Rec #: [id number]             Accession #:    [id number] Date of Birth: [birth year]             Patient Gender: F Patient Age:   85  years Exam Location:  [facility] Procedure:      VAS [place] UPPER EXT VEIN MAPPING (PRE-OP AVF) Referring Phys: Roney Jaffe --------------------------------------------------------------------------------  Indications: ESRD. Limitations: Body habitus, tissue properties, IV, bandages Comparison Study: No prior study on file Performing Technologist: Sharion Dove RVS  Examination Guidelines: A complete evaluation includes B-mode imaging, spectral Doppler, color Doppler, and power Doppler as needed of all accessible portions of each vessel. Bilateral testing is considered an integral part of a complete examination. Limited examinations for reoccurring indications may be performed as noted. +-----------------+-------------+----------+--------------+ Right Cephalic   Diameter (cm)Depth (cm)   Findings    +-----------------+-------------+----------+--------------+ Prox upper arm                          not visualized +-----------------+-------------+----------+--------------+ Mid upper arm                           not visualized +-----------------+-------------+----------+--------------+ Dist upper arm                          not visualized +-----------------+-------------+----------+--------------+ Antecubital fossa    0.23        1.79                  +-----------------+-------------+----------+--------------+ Prox forearm         0.17        1.77                  +-----------------+-------------+----------+--------------+ Mid forearm          0.14        1.57                  +-----------------+-------------+----------+--------------+ Dist forearm                            not visualized +-----------------+-------------+----------+--------------+ Wrist                                   not visualized +-----------------+-------------+----------+--------------+ Unable to visualize right basilic vein +-----------------+-------------+----------+----------------+  Left Cephalic    Diameter (cm)Depth (cm)    Findings     +-----------------+-------------+----------+----------------+ Prox upper arm       0.25        0.46                    +-----------------+-------------+----------+----------------+ Mid upper arm        0.25        0.67                    +-----------------+-------------+----------+----------------+ Dist upper arm       0.42        0.72   partial thrombus +-----------------+-------------+----------+----------------+ Antecubital fossa    0.67        0.30       thrombus     +-----------------+-------------+----------+----------------+ Prox forearm                             not visualized  +-----------------+-------------+----------+----------------+ Mid forearm                              not visualized  +-----------------+-------------+----------+----------------+ Dist forearm                             not visualized  +-----------------+-------------+----------+----------------+ Wrist  not visualized  +-----------------+-------------+----------+----------------+ +-----------------+-------------+----------+--------------+ Left Basilic     Diameter (cm)Depth (cm)   Findings    +-----------------+-------------+----------+--------------+ Prox upper arm       0.35        1.72       origin     +-----------------+-------------+----------+--------------+ Mid upper arm        0.32        0.92                  +-----------------+-------------+----------+--------------+ Dist upper arm       0.25        0.75                  +-----------------+-------------+----------+--------------+ Antecubital fossa    0.13        0.11                  +-----------------+-------------+----------+--------------+ Prox forearm         0.09        1.06                  +-----------------+-------------+----------+--------------+ Mid forearm          0.07        0.18                   +-----------------+-------------+----------+--------------+ Distal forearm                          not visualized +-----------------+-------------+----------+--------------+ Wrist                                   not visualized +-----------------+-------------+----------+--------------+ *See table(s) above for measurements and observations.  Diagnosing physician: Monica Martinez MD Electronically signed by Monica Martinez MD on 01/04/2021 at 4:06:27 PM.    Final         Scheduled Meds:  [MAR Hold] amitriptyline  100 mg Oral QHS   [MAR Hold] amLODipine  10 mg Oral Daily   [MAR Hold] atorvastatin  20 mg Oral Daily   [MAR Hold] buPROPion  150 mg Oral Daily   [MAR Hold] Chlorhexidine Gluconate Cloth  6 each Topical Q0600   [MAR Hold] heparin  5,000 Units Subcutaneous Q8H   [MAR Hold] hydrALAZINE  50 mg Oral TID   [MAR Hold] hydrocerin   Topical BID   [MAR Hold] insulin aspart  0-6 Units Subcutaneous TID WC   [MAR Hold] levothyroxine  88 mcg Oral Daily   [MAR Hold] metoprolol succinate  25 mg Oral Daily   [MAR Hold] sodium bicarbonate  650 mg Oral BID   Continuous Infusions:  ceFAZolin     [MAR Hold] cefTRIAXone (ROCEPHIN)  IV 2 g (01/04/21 2012)          Aline August, MD [facility] 01/05/2021, 8:12 AM

## 2021-01-05 NOTE — Progress Notes (Signed)
Out Patient Arrangements:  Have been requested to arrange out pt HD for pt. Have submitted medical records to [facility] admissions. Please advise of d/c date.   Linus Orn [facility] [phone]

## 2021-01-05 NOTE — Op Note (Signed)
NAME: [name] [name]    MRN: [id number] DOB: [birth year]    DATE OF OPERATION: 01/05/2021  PREOP DIAGNOSIS:    End-stage renal disease  POSTOP DIAGNOSIS:    Same  PROCEDURE:    Ultrasound-guided placement of right IJ tunneled dialysis catheter (23 cm) New left brachiocephalic AV fistula  SURGEON: Judeth Cornfield. Scot Dock, MD  ASSIST: Arlee Muslim, PA  ANESTHESIA: General  EBL: Minimal  INDICATIONS:    [name] [name] is a 45 y.o. female who presents for new access.  FINDINGS:   4 mm upper arm cephalic vein.  There was clot at the antecubital level which easily was thrombectomized.  TECHNIQUE:   The patient was taken to the operating room and received a general anesthetic.  Anesthesia had placed a left IJ catheter for IV access.  The right neck and chest were prepped and draped in the usual sterile fashion.  Under ultrasound guidance, the right IJ was cannulated with a micropuncture needle and a micropuncture sheath introduced over the wire.  Position was confirmed with fluoroscopy.  Under fluoroscopic guidance I then advanced the J-wire through the micropuncture sheath.  This was positioned in the right atrium.  The exit site for the catheter was selected.  This was a 23 sonometer catheter.  An incision was made and the catheter brought through the tunnel.  The tract over the wire was then dilated and the dilator and peel-away sheath were advanced over the wire and the wire and dilator removed.  The catheter was passed through the peel-away sheath and positioned in the right atrium.  Both ports withdrew easily were then flushed with heparinized saline and filled with concentrated heparin.  The catheter was secured at its exit site with a 3-0 nylon suture.  The IJ cannulation site was closed with a 4-0 Monocryl.  Attention was then turned to the left arm.  I looked at the upper arm cephalic vein myself with the SonoSite and it looked reasonable for a potential fistula.   There was some clot at the antecubital level which looked fresh as it was an IV site there.  Transverse incision was made just above the antecubital level on the left.  Here the cephalic vein was dissected free.  It was some inflammation around the vein.  The vein was ligated distally and the clot was easily thrombectomized.  I then irrigated with heparinized saline.  There was some mild narrowing about 2 cm into the vein and I dilated this up to a 5 mm dilator.  It then irrigated fairly well with heparinized saline.  The brachial artery was dissected free beneath the fascia.  The patient was heparinized.  The brachial artery was clamped proximally and distally and a longitudinal arteriotomy was made.  The vein was sewn end-to-side to the artery using continuous 6-0 Prolene suture.  At the completion there was a palpable thrill in the fistula.  There was a radial and ulnar signal with the Doppler.  Pneumostasis was obtained in the wound.  The wound was then closed with a deep layer of 3-0 Vicryl and the skin closed with 4-0 Monocryl.  Dermabond was applied.  The patient tolerated procedure well was transferred to the recovery room in stable condition.  All needle and sponge counts were correct.  Given the complexity of the case a first assistant was necessary in order to expedient the procedure and safely perform the technical aspects of the operation.  [name], MD, FACS [facility] [place]  DATE OF DICTATION:   01/05/2021

## 2021-01-05 NOTE — Care Management Important Message (Signed)
Important Message  Patient Details  Name: [name] [name] MRN: [id number] Date of Birth: 1976-01-07   Medicare Important Message Given:  Yes     [name] Montine Circle 01/05/2021, 4:29 PM

## 2021-01-05 NOTE — Anesthesia Procedure Notes (Signed)
Procedure Name: LMA Insertion Date/Time: 01/05/2021 8:08 AM Performed by: Renato Shin, CRNA Pre-anesthesia Checklist: Patient identified, Emergency Drugs available, Suction available and Patient being monitored Patient Re-evaluated:Patient Re-evaluated prior to induction Oxygen Delivery Method: Circle system utilized Preoxygenation: Pre-oxygenation with 100% oxygen Induction Type: IV induction LMA: LMA inserted LMA Size: 4.0 Placement Confirmation: positive ETCO2 and breath sounds checked- equal and bilateral Tube secured with: Tape Dental Injury: Teeth and Oropharynx as per pre-operative assessment

## 2021-01-05 NOTE — Progress Notes (Signed)
Patient ID: ENA DEMARY, female   DOB: Sep 11, 1975, 45 y.o.   MRN: [id number] [facility] Progress Note   Assessment/ Plan:   1. Acute kidney Injury on chronic kidney disease stage V: Database points to likely progression of chronic kidney disease stage V (proteinuric diabetic nephropathy) now to end-stage renal disease.  She is S/P RIJ TDC and left arm BC fistula creation today by Scot Dock and will have initiation of dialysis later today. 2.  Volume overload: Limited response to diuretic therapy so far, monitor with ultrafiltration/hemodialysis. 3.  Hyponatremia: Secondary to volume excess and declining renal function/impaired free water excretion.  Monitor with hemodialysis. 4.  Flank pain/pyuria/bladder wall thickening: Suspect to be consistent with cystitis/UTI-on antibiotic coverage with ceftriaxone-urine culture polymicrobial. 5.  Anemia of chronic disease: Iron deficiency seen on labs-- begin IV Fe along with ESA.  6.  CKD-MBD: Phosphorus elevated, being calcium acetate TIDAC.  Subjective:   Denies any complaints at this time and is pain-free.    Objective:   BP 97/69 (BP Location: Right Arm)   Pulse 85   Temp 98.2 F (36.8 C)   Resp 20   Ht 5\' 9"  (1.753 m)   Wt (!) 137 kg   SpO2 100%   BMI 44.60 kg/m   Intake/Output Summary (Last 24 hours) at 01/05/2021 1154 Last data filed at 01/05/2021 1004 Gross per 24 hour  Intake 930 ml  Output 930 ml  Net 0 ml   Weight change:   Physical Exam: Gen: Resting comfortably in bed- somnolent CVS: Pulse regular rhythm, normal rate, S1 and S2 with an ejection systolic murmur Resp: Clear to auscultation bilaterally, no distinct rales or rhonchi Abd: Soft, obese, nontender, bowel sounds normal Ext: Status post left above-knee amputation, 1-2+ right lower extremity edema with 1-2+ hip edema.  Imaging: DG Chest Port 1 View  Result Date: 01/05/2021 CLINICAL DATA:  Dialysis catheter placement. EXAM: PORTABLE CHEST 1  VIEW COMPARISON:  Chest x-ray dated Oct 21, 2019. FINDINGS: The patient is rotated to the right. New tunneled right internal jugular dialysis catheter with tip at the cavoatrial junction. Left internal jugular central venous catheter with tip likely in the left brachiocephalic vein. Cardiomegaly. Low lung volumes are present, causing crowding of the pulmonary vasculature. Minimal scarring in the peripheral right mid lung. No focal consolidation, pleural effusion, or pneumothorax. No acute osseous abnormality. IMPRESSION: 1. Bilateral internal jugular venous catheters without complicating feature. 2. Low lung volumes.  No active disease. Electronically Signed   By: Titus Dubin M.D.   On: 01/05/2021 11:08   DG Fluoro Guide CV Line-No Report  Result Date: 01/05/2021 Fluoroscopy was utilized by the requesting physician.  No radiographic interpretation.   VAS [place] UPPER EXT VEIN MAPPING (PRE-OP AVF)  Result Date: 01/04/2021 UPPER EXTREMITY VEIN MAPPING Patient Name:  LYNNET HEFLEY  Date of Exam:   01/03/2021 Medical Rec #: [id number]             Accession #:    [id number] Date of Birth: [birth year]             Patient Gender: F Patient Age:   76 years Exam Location:  [facility] Procedure:      VAS [place] UPPER EXT VEIN MAPPING (PRE-OP AVF) Referring Phys: Roney Jaffe --------------------------------------------------------------------------------  Indications: ESRD. Limitations: Body habitus, tissue properties, IV, bandages Comparison Study: No prior study on file Performing Technologist: Sharion Dove RVS  Examination Guidelines: A complete evaluation includes B-mode imaging, spectral Doppler, color Doppler,  and power Doppler as needed of all accessible portions of each vessel. Bilateral testing is considered an integral part of a complete examination. Limited examinations for reoccurring indications may be performed as noted. +-----------------+-------------+----------+--------------+ Right  Cephalic   Diameter (cm)Depth (cm)   Findings    +-----------------+-------------+----------+--------------+ Prox upper arm                          not visualized +-----------------+-------------+----------+--------------+ Mid upper arm                           not visualized +-----------------+-------------+----------+--------------+ Dist upper arm                          not visualized +-----------------+-------------+----------+--------------+ Antecubital fossa    0.23        1.79                  +-----------------+-------------+----------+--------------+ Prox forearm         0.17        1.77                  +-----------------+-------------+----------+--------------+ Mid forearm          0.14        1.57                  +-----------------+-------------+----------+--------------+ Dist forearm                            not visualized +-----------------+-------------+----------+--------------+ Wrist                                   not visualized +-----------------+-------------+----------+--------------+ Unable to visualize right basilic vein +-----------------+-------------+----------+----------------+ Left Cephalic    Diameter (cm)Depth (cm)    Findings     +-----------------+-------------+----------+----------------+ Prox upper arm       0.25        0.46                    +-----------------+-------------+----------+----------------+ Mid upper arm        0.25        0.67                    +-----------------+-------------+----------+----------------+ Dist upper arm       0.42        0.72   partial thrombus +-----------------+-------------+----------+----------------+ Antecubital fossa    0.67        0.30       thrombus     +-----------------+-------------+----------+----------------+ Prox forearm                             not visualized  +-----------------+-------------+----------+----------------+ Mid forearm                               not visualized  +-----------------+-------------+----------+----------------+ Dist forearm                             not visualized  +-----------------+-------------+----------+----------------+ Wrist  not visualized  +-----------------+-------------+----------+----------------+ +-----------------+-------------+----------+--------------+ Left Basilic     Diameter (cm)Depth (cm)   Findings    +-----------------+-------------+----------+--------------+ Prox upper arm       0.35        1.72       origin     +-----------------+-------------+----------+--------------+ Mid upper arm        0.32        0.92                  +-----------------+-------------+----------+--------------+ Dist upper arm       0.25        0.75                  +-----------------+-------------+----------+--------------+ Antecubital fossa    0.13        0.11                  +-----------------+-------------+----------+--------------+ Prox forearm         0.09        1.06                  +-----------------+-------------+----------+--------------+ Mid forearm          0.07        0.18                  +-----------------+-------------+----------+--------------+ Distal forearm                          not visualized +-----------------+-------------+----------+--------------+ Wrist                                   not visualized +-----------------+-------------+----------+--------------+ *See table(s) above for measurements and observations.  Diagnosing physician: Monica Martinez MD Electronically signed by Monica Martinez MD on 01/04/2021 at 4:06:27 PM.    Final     Labs: BMET Recent Labs  Lab 12/31/20 1601 01/01/21 0049 01/01/21 0600 01/02/21 0553 01/03/21 0511 01/04/21 0853 01/04/21 1314  NA 142  --  138 133* 134* 133*  --   K 5.3*  --  4.3 5.6* 4.8 4.9  --   CL 112*  --  110 106 105 105  --   CO2 19*  --  19* 18* 17* 18*  --    GLUCOSE 157*  --  110* 126* 117* 117*  --   BUN 68*  --  65* 74* 77* 85*  --   CREATININE 6.26* 6.58* 6.45* 7.51* 7.91* 8.49*  --   CALCIUM 8.9  --  7.9* 7.8* 7.7* 7.7*  --   PHOS  --   --   --   --   --   --  8.5*   CBC Recent Labs  Lab 12/31/20 1601 01/01/21 0049 01/01/21 0600 01/02/21 0553 01/03/21 0511 01/04/21 0853  WBC 8.2   < > 6.7 4.8 4.6 4.0  NEUTROABS 6.6  --   --   --   --   --   HGB 10.2*   < > 8.3* 8.2* 7.7* 8.5*  HCT 32.8*   < > 27.5* 27.5* 25.5* 27.9*  MCV 82.6   < > 84.6 87.3 85.3 84.5  PLT 280   < > 210 205 184 205   < > = values in this interval not displayed.    Medications:     [MAR Hold] amitriptyline  100 mg Oral QHS   [MAR Hold] amLODipine  10 mg Oral Daily   [MAR Hold] atorvastatin  20 mg Oral Daily   [MAR Hold] buPROPion  150 mg Oral Daily   [MAR Hold] Chlorhexidine Gluconate Cloth  6 each Topical Q0600   [MAR Hold] heparin  5,000 Units Subcutaneous Q8H   [MAR Hold] hydrALAZINE  50 mg Oral TID   [MAR Hold] hydrocerin   Topical BID   [MAR Hold] insulin aspart  0-6 Units Subcutaneous TID WC   [MAR Hold] levothyroxine  88 mcg Oral Daily   [MAR Hold] metoprolol succinate  25 mg Oral Daily   [MAR Hold] sodium bicarbonate  650 mg Oral BID   Elmarie Shiley, MD 01/05/2021, 11:54 AM

## 2021-01-05 NOTE — Anesthesia Procedure Notes (Addendum)
  Central Venous Catheter Insertion Performed by: Audry Pili, MD, anesthesiologist Start/End8/16/2022 7:40 AM, 01/05/2021 7:54 AM Patient location: Pre-op. Preanesthetic checklist: patient identified, IV checked, risks and benefits discussed, surgical consent, monitors and equipment checked, pre-op evaluation, timeout performed and anesthesia consent Position: Trendelenburg Lidocaine 1% used for infiltration and patient sedated Hand hygiene performed , maximum sterile barriers used  and Seldinger technique used Catheter size: 8 Fr Central line was placed.Double lumen Procedure performed using ultrasound guided technique. Ultrasound Notes:anatomy identified, needle tip was noted to be adjacent to the nerve/plexus identified, no ultrasound evidence of intravascular and/or intraneural injection and image(s) printed for medical record Attempts: 2 (First attempt, unable to pass dilator, wire bent upon removal) Following insertion, line sutured, dressing applied and Biopatch. Post procedure assessment: blood return through all ports, free fluid flow and no air  Patient tolerated the procedure well with no immediate complications.

## 2021-01-05 NOTE — Interval H&P Note (Signed)
History and Physical Interval Note:  01/05/2021 7:18 AM  [name] [name]  has presented today for surgery, with the diagnosis of CKD.  The various methods of treatment have been discussed with the patient and family. After consideration of risks, benefits and other options for treatment, the patient has consented to  Procedure(s): INSERTION OF DIALYSIS CATHETER (Left) ARTERIOVENOUS (AV) FISTULA CREATION vs. GRAFT (Left) as a surgical intervention.  The patient's history has been reviewed, patient examined, no change in status, stable for surgery.  I have reviewed the patient's chart and labs.  Questions were answered to the patient's satisfaction.     [name]

## 2021-01-05 NOTE — Anesthesia Postprocedure Evaluation (Signed)
Anesthesia Post Note  Patient: [name] [name]  Procedure(s) Performed: INSERTION OF DIALYSIS CATHETER (Right: Chest) ARTERIOVENOUS (AV) FISTULA CREATION vs. GRAFT (Left: Arm Upper)     Patient location during evaluation: PACU Anesthesia Type: General Level of consciousness: awake and alert Pain management: pain level controlled Vital Signs Assessment: post-procedure vital signs reviewed and stable Respiratory status: spontaneous breathing, nonlabored ventilation and respiratory function stable Cardiovascular status: blood pressure returned to baseline and stable Postop Assessment: no apparent nausea or vomiting Anesthetic complications: no   No notable events documented.  Last Vitals:  Vitals:   01/05/21 1100 01/05/21 1120  BP: 114/68 97/69  Pulse: 84 85  Resp: 18 20  Temp: (!) 36.2 C 36.8 C  SpO2: 96% 100%    Last Pain:  Vitals:   01/05/21 1100  TempSrc:   PainSc: [place]

## 2021-01-06 ENCOUNTER — Encounter: Payer: Self-pay | Admitting: Physical Therapy

## 2021-01-06 ENCOUNTER — Encounter (HOSPITAL_COMMUNITY): Payer: Self-pay | Admitting: Vascular Surgery

## 2021-01-06 DIAGNOSIS — N179 Acute kidney failure, unspecified: Secondary | ICD-10-CM | POA: Diagnosis not present

## 2021-01-06 LAB — CBC
HCT: 22.8 % — ABNORMAL LOW (ref 36.0–46.0)
Hemoglobin: 7.2 g/dL — ABNORMAL LOW (ref 12.0–15.0)
MCH: 26.2 pg (ref 26.0–34.0)
MCHC: 31.6 g/dL (ref 30.0–36.0)
MCV: 82.9 fL (ref 80.0–100.0)
Platelets: 183 10*3/uL (ref 150–400)
RBC: 2.75 MIL/uL — ABNORMAL LOW (ref 3.87–5.11)
RDW: 14.4 % (ref 11.5–15.5)
WBC: 5.8 10*3/uL (ref 4.0–10.5)
nRBC: 0 % (ref 0.0–0.2)

## 2021-01-06 LAB — GLUCOSE, CAPILLARY
Glucose-Capillary: 128 mg/dL — ABNORMAL HIGH (ref 70–99)
Glucose-Capillary: 155 mg/dL — ABNORMAL HIGH (ref 70–99)
Glucose-Capillary: 121 mg/dL — ABNORMAL HIGH (ref 70–99)
Glucose-Capillary: 136 mg/dL — ABNORMAL HIGH (ref 70–99)

## 2021-01-06 MED ORDER — KIDNEY FAILURE BOOK: Freq: Once | Status: AC

## 2021-01-06 NOTE — Progress Notes (Signed)
Rounded on patient today in correlation to transition to outpatient HD. Permission granted to follow-up on information patient found in her renal videos. Patient reports that she is familiar with HD as her mother was also a HD patient before passing. Patient admits this makes her fearful at times and she became tearful. Patient given the opportunity to express her concerns.   Also ordered consult to dietician and Kidney Failure book. Patient educated at the bedside regarding care of tunneled dialysis catheter, AV fistula care, assessment of thrill daily and proper medication administration on HD days.  Patient also educated on the importance of adhering to scheduled dialysis treatments, the effects of fluid overload, hyperkalemia and hyperphosphatemia. Patient capable of re-verbalizing via teach back method. Handouts and contact information provided to patient for any further assistance. Will continue to follow as appropriate.   Dorthey Sawyer, RN  Dialysis Nurse Coordinator Phone: [phone]

## 2021-01-06 NOTE — Progress Notes (Signed)
PROGRESS NOTE    KYLEENA SCHEIRER  GNO:[id number] DOB: [birth year] DOA: 12/31/2020 PCP: Riesa Pope, MD    Brief Narrative:  This 45 year old female with history of chronic kidney disease stage IV, hypertension, diabetes mellitus type 2, tobacco abuse, anemia of chronic disease, chronic, left AKA, morbid obesity, chronic pain presented with nausea and vomiting with left flank pain.  On presentation, she was found to have worsening renal failure with metabolic acidosis and possible UTI.  CT of the abdomen and pelvis showed features concerning for cystitis, gallstones with no cholecystitis.  She was started on IV fluids and antibiotics.  Nephrology was consulted who recommended transfer patient to [facility] for possible need of hemodialysis.  Patient is started on hemodialysis tolerated well.  Plan is to set up outpatient hemodialysis before she can be discharged.  Assessment & Plan:   Principal Problem:   ARF (acute renal failure) (HCC) Active Problems:   Controlled type 2 diabetes mellitus (HCC)   OSA (obstructive sleep apnea)   Pyelonephritis   Hypertensive urgency   Acute renal failure (ARF) (HCC)  AKI on CKD stage IV : Progression of CKD stage IV now end-stage renal disease requiring hemodialysis Acute metabolic acidosis, Patient presented with worsening renal functions associated with nausea and vomiting. Nephrology consulted patient transferred to [facility] for dialysis. Creatinine 8.49 on 01/04/2021, 7.51 on 01/02/2021.   Hold nephrotoxic medications including lisinopril and Bumex Strict input and output.  Daily weights.  Fluid restriction.   Underwent tunneled dialysis catheter followed by hemodialysis on 8/16. Patient is started on hemodialysis schedule. Plan is to set up outpatient hemodialysis set before she can be discharged.   Hyperkalemia Improved with HD.  Hypertensive urgency > Improved. Blood pressure currently currently stable.   Continue  amlodipine, metoprolol, hydralazine.   Bumex and lisinopril on hold   Suspect acute pyelonephritis Urine culture grew multiple species.  Continue Rocephin.   Hypothyroidism:  Continue Synthroid  Diabetes mellitus type 2 -A1c 6 in April 2022.   Continue CBGs with SSI   OSA not using CPAP Noncompliant with CPAP  History of left AKA : Stable,  Morbid obesity -Outpatient follow-up  History of chronic pain on chronic opiate use Continue as needed opiates.  Outpatient follow-up with PCP/pain management   Anemia of chronic disease  -hemoglobin stable.  No signs of bleeding.  Monitor   DVT prophylaxis: Heparin Code Status: Full code. Family Communication: No family at bed side. Disposition Plan:    Status is: Inpatient  Remains inpatient appropriate because:Inpatient level of care appropriate due to severity of illness  Dispo: The patient is from: Home              Anticipated d/c is to: Home              Patient currently is not medically stable to d/c.   Difficult to place patient No  Consultants:  Nephrology Vascular Sx.  Procedures: Tunneled dialysis catheter, AV fistula  Antimicrobials:   Anti-infectives (From admission, onward)    Start     Dose/Rate Route Frequency Ordered Stop   01/05/21 0741  ceFAZolin (ANCEF) 2-4 GM/100ML-% IVPB       Note to Pharmacy: Mendel Corning   : cabinet override      01/05/21 0741 01/05/21 1944   01/01/21 2000  cefTRIAXone (ROCEPHIN) 2 g in sodium chloride 0.9 % 100 mL IVPB        2 g 200 mL/hr over 30 Minutes Intravenous Every 24 hours  12/31/20 2341     12/31/20 2015  cefTRIAXone (ROCEPHIN) 1 g in sodium chloride 0.9 % 100 mL IVPB        1 g 200 mL/hr over 30 Minutes Intravenous  Once 12/31/20 2003 12/31/20 2105        Subjective: Patient was seen and examined at bedside.  Overnight events noted.  She reports feeling much better.  Denies nausea and vomiting.  Objective: Vitals:   01/05/21 1825 01/05/21 2109 01/06/21  0510 01/06/21 1008  BP: (!) 167/87 (!) 114/58 112/64 114/66  Pulse: 96 91 85 86  Resp: 18 18 18 18   Temp: 98.5 F (36.9 C) 98.4 F (36.9 C) 98.2 F (36.8 C) 98.6 F (37 C)  TempSrc:  Oral Oral Oral  SpO2: 98% 96% 92% 95%  Weight:  133.3 kg    Height:        Intake/Output Summary (Last 24 hours) at 01/06/2021 1451 Last data filed at 01/06/2021 1300 Gross per 24 hour  Intake 580 ml  Output 2000 ml  Net -1420 ml   Filed Weights   01/05/21 1448 01/05/21 1758 01/05/21 2109  Weight: 135.3 kg 133.3 kg 133.3 kg    Examination:  General exam: Appears calm and comfortable, not in any acute distress. Respiratory system: Clear to auscultation. Respiratory effort normal.  RR 15 Cardiovascular system: S1 & S2 heard, RRR. No JVD, murmurs, rubs, gallops or clicks. No pedal edema. Gastrointestinal system: Abdomen is soft, nontender, nondistended.  No organomegaly or masses felt. Normal bowel sounds heard. Central nervous system: Alert and oriented. No focal neurological deficits. Extremities: Left AKA, right lower extremity 1+ pitting edema Skin: No rashes, lesions or ulcers Psychiatry: Judgement and insight appear normal. Mood & affect appropriate.     Data Reviewed: I have personally reviewed following labs and imaging studies  CBC: Recent Labs  Lab 12/31/20 1601 01/01/21 0049 01/02/21 0553 01/03/21 0511 01/04/21 0853 01/05/21 1311 01/06/21 0412  WBC 8.2   < > 4.8 4.6 4.0 7.1 5.8  NEUTROABS 6.6  --   --   --   --   --   --   HGB 10.2*   < > 8.2* 7.7* 8.5* 7.8* 7.2*  HCT 32.8*   < > 27.5* 25.5* 27.9* 25.3* 22.8*  MCV 82.6   < > 87.3 85.3 84.5 84.3 82.9  PLT 280   < > 205 184 205 199 183   < > = values in this interval not displayed.   Basic Metabolic Panel: Recent Labs  Lab 12/31/20 1601 01/01/21 0049 01/01/21 0600 01/02/21 0553 01/03/21 0511 01/04/21 0853 01/04/21 1314  NA 142  --  138 133* 134* 133*  --   K 5.3*  --  4.3 5.6* 4.8 4.9  --   CL 112*  --  110 106  105 105  --   CO2 19*  --  19* 18* 17* 18*  --   GLUCOSE 157*  --  110* 126* 117* 117*  --   BUN 68*  --  65* 74* 77* 85*  --   CREATININE 6.26* 6.58* 6.45* 7.51* 7.91* 8.49*  --   CALCIUM 8.9  --  7.9* 7.8* 7.7* 7.7*  --   PHOS  --   --   --   --   --   --  8.5*   GFR: Estimated Creatinine Clearance: 12.4 mL/min (A) (by C-G formula based on SCr of 8.49 mg/dL (H)). Liver Function Tests: Recent Labs  Lab 12/31/20 1601  01/01/21 0600  AST 10* 10*  ALT 9 9  ALKPHOS 75 60  BILITOT 0.5 0.5  PROT 7.6 6.1*  ALBUMIN 3.4* 2.7*   Recent Labs  Lab 12/31/20 1601  LIPASE 37   No results for input(s): AMMONIA in the last 168 hours. Coagulation Profile: No results for input(s): INR, PROTIME in the last 168 hours. Cardiac Enzymes: Recent Labs  Lab 01/01/21 0600  CKTOTAL 101   BNP (last 3 results) No results for input(s): PROBNP in the last 8760 hours. HbA1C: No results for input(s): HGBA1C in the last 72 hours. CBG: Recent Labs  Lab 01/05/21 1123 01/05/21 1827 01/05/21 2110 01/06/21 0639 01/06/21 1123  GLUCAP 161* 111* 225* 128* 155*   Lipid Profile: No results for input(s): CHOL, HDL, LDLCALC, TRIG, CHOLHDL, LDLDIRECT in the last 72 hours. Thyroid Function Tests: No results for input(s): TSH, T4TOTAL, FREET4, T3FREE, THYROIDAB in the last 72 hours. Anemia Panel: Recent Labs    01/04/21 1314  FERRITIN 83  TIBC 200*  IRON 31   Sepsis Labs: No results for input(s): PROCALCITON, LATICACIDVEN in the last 168 hours.  Recent Results (from the past 240 hour(s))  Resp Panel by RT-PCR (Flu A&B, Covid) Nasopharyngeal Swab     Status: None   Collection Time: 12/31/20  6:24 PM   Specimen: Nasopharyngeal Swab; Nasopharyngeal(NP) swabs in vial transport medium  Result Value Ref Range Status   SARS Coronavirus 2 by RT PCR NEGATIVE NEGATIVE Final    Comment: (NOTE) SARS-CoV-2 target nucleic acids are NOT DETECTED.  The SARS-CoV-2 RNA is generally detectable in upper  respiratory specimens during the acute phase of infection. The lowest concentration of SARS-CoV-2 viral copies this assay can detect is 138 copies/mL. A negative result does not preclude SARS-Cov-2 infection and should not be used as the sole basis for treatment or other patient management decisions. A negative result may occur with  improper specimen collection/handling, submission of specimen other than nasopharyngeal swab, presence of viral mutation(s) within the areas targeted by this assay, and inadequate number of viral copies(<138 copies/mL). A negative result must be combined with clinical observations, patient history, and epidemiological information. The expected result is Negative.  Fact Sheet for Patients:  EntrepreneurPulse.com.au  Fact Sheet for Healthcare Providers:  IncredibleEmployment.be  This test is no t yet approved or cleared by the [place] FDA and  has been authorized for detection and/or diagnosis of SARS-CoV-2 by FDA under an Emergency Use Authorization (EUA). This EUA will remain  in effect (meaning this test can be used) for the duration of the COVID-19 declaration under Section 564(b)(1) of the Act, 21 U.S.C.section 360bbb-3(b)(1), unless the authorization is terminated  or revoked sooner.       Influenza A by PCR NEGATIVE NEGATIVE Final   Influenza B by PCR NEGATIVE NEGATIVE Final    Comment: (NOTE) The Xpert Xpress SARS-CoV-2/FLU/RSV plus assay is intended as an aid in the diagnosis of influenza from Nasopharyngeal swab specimens and should not be used as a sole basis for treatment. Nasal washings and aspirates are unacceptable for Xpert Xpress SARS-CoV-2/FLU/RSV testing.  Fact Sheet for Patients: EntrepreneurPulse.com.au  Fact Sheet for Healthcare Providers: IncredibleEmployment.be  This test is not yet approved or cleared by the [place] FDA and has been  authorized for detection and/or diagnosis of SARS-CoV-2 by FDA under an Emergency Use Authorization (EUA). This EUA will remain in effect (meaning this test can be used) for the duration of the COVID-19 declaration under Section 564(b)(1) of the Act,  21 U.S.C. section 360bbb-3(b)(1), unless the authorization is terminated or revoked.  Performed at [facility], [place] [address], [place], [place] [address]   Urine Culture     Status: Abnormal   Collection Time: 01/01/21 12:49 AM   Specimen: Urine, Clean Catch  Result Value Ref Range Status   Specimen Description   Final    URINE, CLEAN CATCH Performed at [facility], [place] [address], [place], [place] [address]    Special Requests   Final    NONE Performed at [facility], [place] [address] [place], [place] [address]    Culture MULTIPLE SPECIES PRESENT, SUGGEST RECOLLECTION (A)  Final   Report Status 01/02/2021 FINAL  Final         Radiology Studies: DG Chest Port 1 View  Result Date: 01/05/2021 CLINICAL DATA:  Dialysis catheter placement. EXAM: PORTABLE CHEST 1 VIEW COMPARISON:  Chest x-ray dated Oct 21, 2019. FINDINGS: The patient is rotated to the right. New tunneled right internal jugular dialysis catheter with tip at the cavoatrial junction. Left internal jugular central venous catheter with tip likely in the left brachiocephalic vein. Cardiomegaly. Low lung volumes are present, causing crowding of the pulmonary vasculature. Minimal scarring in the peripheral right mid lung. No focal consolidation, pleural effusion, or pneumothorax. No acute osseous abnormality. IMPRESSION: 1. Bilateral internal jugular venous catheters without complicating feature. 2. Low lung volumes.  No active disease. Electronically Signed   By: Titus Dubin M.D.   On: 01/05/2021 11:08   DG Fluoro Guide CV Line-No Report  Result Date: 01/05/2021 Fluoroscopy was utilized by the requesting physician.   No radiographic interpretation.    Scheduled Meds:  amitriptyline  100 mg Oral QHS   amLODipine  10 mg Oral Daily   atorvastatin  20 mg Oral Daily   buPROPion  150 mg Oral Daily   calcium acetate  1,334 mg Oral TID WC   Chlorhexidine Gluconate Cloth  6 each Topical Q0600   darbepoetin (ARANESP) injection - DIALYSIS  100 mcg Intravenous Q Wed-HD   heparin  5,000 Units Subcutaneous Q8H   hydrALAZINE  50 mg Oral TID   hydrocerin   Topical BID   insulin aspart  0-6 Units Subcutaneous TID WC   levothyroxine  88 mcg Oral Daily   metoprolol succinate  25 mg Oral Daily   sodium bicarbonate  650 mg Oral BID   Continuous Infusions:  cefTRIAXone (ROCEPHIN)  IV 2 g (01/05/21 2016)   ferric gluconate (FERRLECIT) IVPB Stopped (01/05/21 1817)     LOS: 5 days    Time spent: [place], MD [facility]   If 7PM-7AM, please contact night-coverage

## 2021-01-06 NOTE — Progress Notes (Addendum)
Patient ID: [name] [name], female   DOB: 03/30/1976, 45 y.o.   MRN: [id number] [facility] Progress Note   Assessment/ Plan:   1.  End-stage renal disease: Following progression of chronic kidney disease stage V (proteinuric diabetic nephropathy) .  She underwent hemodialysis yesterday and is scheduled for her second dialysis treatment today.  With right IJ TDC and left BCF placed yesterday by Dr. Scot Dock.  Process underway for outpatient dialysis unit placement. 2.  Volume overload: Previously on diuretics with unsuccessful volume unloading and now transitioning to long-term hemodialysis. 3.  Hyponatremia: Secondary to volume excess and declining renal function/impaired free water excretion.  Monitor with hemodialysis. 4.  Flank pain/pyuria/bladder wall thickening: Suspect to be consistent with cystitis/UTI-on antibiotic coverage with ceftriaxone-urine culture polymicrobial. 5.  Anemia of chronic disease: Iron deficiency seen on labs-- begin IV Fe along with ESA.  6.  CKD-MBD: Phosphorus elevated, yesterday started on calcium acetate TIDAC.  Subjective:   Complains of some surgical site pain over right chest/left arm and felt wobbly earlier this morning.    Objective:   BP 112/64 (BP Location: Right Arm)   Pulse 85   Temp 98.2 F (36.8 C) (Oral)   Resp 18   Ht 5\' 9"  (1.753 m)   Wt 133.3 kg   SpO2 92%   BMI 43.40 kg/m   Intake/Output Summary (Last 24 hours) at 01/06/2021 0918 Last data filed at 01/05/2021 2109 Gross per 24 hour  Intake 500 ml  Output 2030 ml  Net -1530 ml   Weight change:   Physical Exam: Gen: Comfortably resting in bed, watching dialysis videos on iPad CVS: Pulse regular rhythm, normal rate, S1 and S2 with an ejection systolic murmur Resp: Clear to auscultation bilaterally, no distinct rales or rhonchi.  Right IJ TDC Abd: Soft, obese, nontender, bowel sounds normal Ext: Status post left above-knee amputation, 1-2+ right lower extremity  edema with 1-2+ hip edema.  Left BCF with palpable thrill.  Imaging: DG Chest Port 1 View  Result Date: 01/05/2021 CLINICAL DATA:  Dialysis catheter placement. EXAM: PORTABLE CHEST 1 VIEW COMPARISON:  Chest x-ray dated Oct 21, 2019. FINDINGS: The patient is rotated to the right. New tunneled right internal jugular dialysis catheter with tip at the cavoatrial junction. Left internal jugular central venous catheter with tip likely in the left brachiocephalic vein. Cardiomegaly. Low lung volumes are present, causing crowding of the pulmonary vasculature. Minimal scarring in the peripheral right mid lung. No focal consolidation, pleural effusion, or pneumothorax. No acute osseous abnormality. IMPRESSION: 1. Bilateral internal jugular venous catheters without complicating feature. 2. Low lung volumes.  No active disease. Electronically Signed   By: Titus Dubin M.D.   On: 01/05/2021 11:08   DG Fluoro Guide CV Line-No Report  Result Date: 01/05/2021 Fluoroscopy was utilized by the requesting physician.  No radiographic interpretation.    Labs: BMET Recent Labs  Lab 12/31/20 1601 01/01/21 0049 01/01/21 0600 01/02/21 0553 01/03/21 0511 01/04/21 0853 01/04/21 1314  NA 142  --  138 133* 134* 133*  --   K 5.3*  --  4.3 5.6* 4.8 4.9  --   CL 112*  --  110 106 105 105  --   CO2 19*  --  19* 18* 17* 18*  --   GLUCOSE 157*  --  110* 126* 117* 117*  --   BUN 68*  --  65* 74* 77* 85*  --   CREATININE 6.26* 6.58* 6.45* 7.51* 7.91* 8.49*  --  CALCIUM 8.9  --  7.9* 7.8* 7.7* 7.7*  --   PHOS  --   --   --   --   --   --  8.5*   CBC Recent Labs  Lab 12/31/20 1601 01/01/21 0049 01/03/21 0511 01/04/21 0853 01/05/21 1311 01/06/21 0412  WBC 8.2   < > 4.6 4.0 7.1 5.8  NEUTROABS 6.6  --   --   --   --   --   HGB 10.2*   < > 7.7* 8.5* 7.8* 7.2*  HCT 32.8*   < > 25.5* 27.9* 25.3* 22.8*  MCV 82.6   < > 85.3 84.5 84.3 82.9  PLT 280   < > 184 205 199 183   < > = values in this interval not  displayed.    Medications:     amitriptyline  100 mg Oral QHS   amLODipine  10 mg Oral Daily   atorvastatin  20 mg Oral Daily   buPROPion  150 mg Oral Daily   calcium acetate  1,334 mg Oral TID WC   Chlorhexidine Gluconate Cloth  6 each Topical Q0600   darbepoetin (ARANESP) injection - DIALYSIS  100 mcg Intravenous Q Wed-HD   heparin  5,000 Units Subcutaneous Q8H   hydrALAZINE  50 mg Oral TID   hydrocerin   Topical BID   insulin aspart  0-6 Units Subcutaneous TID WC   levothyroxine  88 mcg Oral Daily   metoprolol succinate  25 mg Oral Daily   sodium bicarbonate  650 mg Oral BID   Elmarie Shiley, MD 01/06/2021, 9:18 AM

## 2021-01-06 NOTE — Progress Notes (Signed)
   VASCULAR SURGERY ASSESSMENT & PLAN:   S/P TDC/ L AVF: Patient is doing well status postplacement of a tunneled dialysis catheter and left AV fistula yesterday.  Her fistula has a good thrill.  She has a palpable radial pulse.  Follow-up has been arranged in 6 weeks.  Vascular surgery will be available as needed.   SUBJECTIVE:   No complaints.  PHYSICAL EXAM:   Vitals:   01/05/21 1758 01/05/21 1825 01/05/21 2109 01/06/21 0510  BP: (!) 181/78 (!) 167/87 (!) 114/58 112/64  Pulse: 92 96 91 85  Resp: 16 18 18 18   Temp: 98 F (36.7 C) 98.5 F (36.9 C) 98.4 F (36.9 C) 98.2 F (36.8 C)  TempSrc: Oral  Oral Oral  SpO2: 98% 98% 96% 92%  Weight: 133.3 kg  133.3 kg   Height:       Catheter site looks fine. Good thrill in left upper arm fistula. Palpable left radial pulse. Her incision looks fine.  LABS:   Lab Results  Component Value Date   WBC 5.8 01/06/2021   HGB 7.2 (L) 01/06/2021   HCT 22.8 (L) 01/06/2021   MCV 82.9 01/06/2021   PLT 183 01/06/2021   CBG (last 3)  Recent Labs    01/05/21 1123 01/05/21 1827 01/05/21 2110  GLUCAP 161* 111* 225*    PROBLEM LIST:    Principal Problem:   ARF (acute renal failure) (HCC) Active Problems:   Controlled type 2 diabetes mellitus (HCC)   OSA (obstructive sleep apnea)   Pyelonephritis   Hypertensive urgency   Acute renal failure (ARF) (HCC)   CURRENT MEDS:    amitriptyline  100 mg Oral QHS   amLODipine  10 mg Oral Daily   atorvastatin  20 mg Oral Daily   buPROPion  150 mg Oral Daily   calcium acetate  1,334 mg Oral TID WC   Chlorhexidine Gluconate Cloth  6 each Topical Q0600   darbepoetin (ARANESP) injection - DIALYSIS  100 mcg Intravenous Q Wed-HD   heparin  5,000 Units Subcutaneous Q8H   hydrALAZINE  50 mg Oral TID   hydrocerin   Topical BID   insulin aspart  0-6 Units Subcutaneous TID WC   levothyroxine  88 mcg Oral Daily   metoprolol succinate  25 mg Oral Daily   sodium bicarbonate  650 mg Oral BID     [name] Office: [phone] 01/06/2021

## 2021-01-06 NOTE — Progress Notes (Signed)
Provided patient with renal IPAD, for education videos.

## 2021-01-06 NOTE — Plan of Care (Signed)
  Problem: Education: Goal: Knowledge of disease and its progression will improve Outcome: Progressing   

## 2021-01-06 NOTE — Therapy (Signed)
.  [place] [address] [place], [place], [address] Phone: [phone]   Fax:  [phone]  Patient Details  Name: MERICA PRELL MRN: [id number] Date of Birth: [birth year] Referring Provider:  No ref. provider found  Encounter Date: 01/06/2021  PHYSICAL THERAPY DISCHARGE SUMMARY  Visits from Start of Care: 18  Current functional level related to goals / functional outcomes: Unable to determine.  Pt was recently hospitalized for acute renal failure and fistula placement.  Pt will require new physician orders to return to therapy when medically stable.   Remaining deficits: Knee pain, impaired standing balance, impaired prosthetic gait   Education / Equipment: HEP   Patient agrees to discharge. Patient goals were not met. Patient is being discharged due to a change in medical status.  Rico Junker, PT, DPT 01/06/21    12:53 PM    [place] [address] [place], [place], [address] Phone: [phone]   Fax:  [phone]

## 2021-01-07 ENCOUNTER — Encounter: Payer: Medicare HMO | Admitting: Student

## 2021-01-07 DIAGNOSIS — N179 Acute kidney failure, unspecified: Secondary | ICD-10-CM | POA: Diagnosis not present

## 2021-01-07 LAB — GLUCOSE, CAPILLARY
Glucose-Capillary: 111 mg/dL — ABNORMAL HIGH (ref 70–99)
Glucose-Capillary: 173 mg/dL — ABNORMAL HIGH (ref 70–99)
Glucose-Capillary: 83 mg/dL (ref 70–99)

## 2021-01-07 LAB — RENAL FUNCTION PANEL
Albumin: 2.8 g/dL — ABNORMAL LOW (ref 3.5–5.0)
Anion gap: 10 (ref 5–15)
BUN: 41 mg/dL — ABNORMAL HIGH (ref 6–20)
CO2: 25 mmol/L (ref 22–32)
Calcium: 7.9 mg/dL — ABNORMAL LOW (ref 8.9–10.3)
Chloride: 98 mmol/L (ref 98–111)
Creatinine, Ser: 5.12 mg/dL — ABNORMAL HIGH (ref 0.44–1.00)
GFR, Estimated: 10 mL/min — ABNORMAL LOW (ref >60)
Glucose, Bld: 136 mg/dL — ABNORMAL HIGH (ref 70–99)
Phosphorus: 4.6 mg/dL (ref 2.5–4.6)
Potassium: 3.7 mmol/L (ref 3.5–5.1)
Sodium: 133 mmol/L — ABNORMAL LOW (ref 135–145)

## 2021-01-07 LAB — BASIC METABOLIC PANEL
Anion gap: 8 (ref 5–15)
BUN: 43 mg/dL — ABNORMAL HIGH (ref 6–20)
CO2: 25 mmol/L (ref 22–32)
Calcium: 8.2 mg/dL — ABNORMAL LOW (ref 8.9–10.3)
Chloride: 100 mmol/L (ref 98–111)
Creatinine, Ser: 5.47 mg/dL — ABNORMAL HIGH (ref 0.44–1.00)
GFR, Estimated: 9 mL/min — ABNORMAL LOW (ref >60)
Glucose, Bld: 115 mg/dL — ABNORMAL HIGH (ref 70–99)
Potassium: 3.7 mmol/L (ref 3.5–5.1)
Sodium: 133 mmol/L — ABNORMAL LOW (ref 135–145)

## 2021-01-07 LAB — CBC
HCT: 22.4 % — ABNORMAL LOW (ref 36.0–46.0)
Hemoglobin: 7 g/dL — ABNORMAL LOW (ref 12.0–15.0)
MCH: 26.3 pg (ref 26.0–34.0)
MCHC: 31.3 g/dL (ref 30.0–36.0)
MCV: 84.2 fL (ref 80.0–100.0)
Platelets: 181 10*3/uL (ref 150–400)
RBC: 2.66 MIL/uL — ABNORMAL LOW (ref 3.87–5.11)
RDW: 14.6 % (ref 11.5–15.5)
WBC: 4.6 10*3/uL (ref 4.0–10.5)
nRBC: 0 % (ref 0.0–0.2)

## 2021-01-07 LAB — PHOSPHORUS: Phosphorus: 5.5 mg/dL — ABNORMAL HIGH (ref 2.5–4.6)

## 2021-01-07 LAB — MAGNESIUM: Magnesium: 2 mg/dL (ref 1.7–2.4)

## 2021-01-07 MED ORDER — HEPARIN SODIUM (PORCINE) 1000 UNIT/ML IJ SOLN
INTRAMUSCULAR | Status: AC
  Administered 2021-01-07: 1000 [IU]
  Filled 2021-01-07: qty 4

## 2021-01-07 NOTE — Progress Notes (Signed)
Patient ID: JOHN VASCONCELOS, female   DOB: 08/05/1975, 45 y.o.   MRN: [id number] [facility] Progress Note   Assessment/ Plan:   1.  End-stage renal disease: Following progression of chronic kidney disease stage V (proteinuric diabetic nephropathy) .  She has undergone 2 dialysis treatments and is scheduled for her third dialysis treatment today.  She has a right IJ TDC and left brachiocephalic placed yesterday by Dr. Scot Dock.  Process underway for outpatient dialysis unit placement (she does not want to be placed at [facility] where her late mother, Pamala Hayman used to dialysis). 2.  Volume overload: Previously on diuretics with unsuccessful volume unloading and now transitioning to long-term hemodialysis. 3.  Hyponatremia: Secondary to volume excess and declining renal function/impaired free water excretion.  Monitor with hemodialysis. 4.  Flank pain/pyuria/bladder wall thickening: Suspect to be consistent with cystitis/UTI-on antibiotic coverage with ceftriaxone-urine culture polymicrobial. 5.  Anemia of chronic disease: Iron deficiency seen on labs-- begin IV Fe along with ESA.  6.  CKD-MBD: Phosphorus elevated, continue to monitor trend on calcium acetate TIDAC.  Subjective:   Denies any acute complaints overnight and feels a little more stable when hopping on her right leg.   Objective:   BP 123/70 (BP Location: Right Arm)   Pulse 83   Temp 98.1 F (36.7 C) (Oral)   Resp 18   Ht 5\' 9"  (1.753 m)   Wt (!) 137.4 kg   SpO2 96%   BMI 44.73 kg/m   Intake/Output Summary (Last 24 hours) at 01/07/2021 1029 Last data filed at 01/07/2021 0600 Gross per 24 hour  Intake 1190 ml  Output 1900 ml  Net -710 ml   Weight change: 2.3 kg  Physical Exam: Gen: Appears to be comfortable sitting up on the edge of her bed CVS: Pulse regular rhythm, normal rate, S1 and S2 with an ejection systolic murmur Resp: Clear to auscultation bilaterally, no distinct rales or  rhonchi.  Right IJ TDC Abd: Soft, obese, nontender, bowel sounds normal Ext: Status post left above-knee amputation, 1 plus right lower extremity edema with 1-2+ hip edema.  Left BCF with palpable thrill.  Imaging: DG Chest Port 1 View  Result Date: 01/05/2021 CLINICAL DATA:  Dialysis catheter placement. EXAM: PORTABLE CHEST 1 VIEW COMPARISON:  Chest x-ray dated Oct 21, 2019. FINDINGS: The patient is rotated to the right. New tunneled right internal jugular dialysis catheter with tip at the cavoatrial junction. Left internal jugular central venous catheter with tip likely in the left brachiocephalic vein. Cardiomegaly. Low lung volumes are present, causing crowding of the pulmonary vasculature. Minimal scarring in the peripheral right mid lung. No focal consolidation, pleural effusion, or pneumothorax. No acute osseous abnormality. IMPRESSION: 1. Bilateral internal jugular venous catheters without complicating feature. 2. Low lung volumes.  No active disease. Electronically Signed   By: Titus Dubin M.D.   On: 01/05/2021 11:08    Labs: BMET Recent Labs  Lab 12/31/20 1601 01/01/21 0049 01/01/21 0600 01/02/21 0553 01/03/21 0511 01/04/21 0853 01/04/21 1314 01/06/21 2219 01/07/21 0530  NA 142  --  138 133* 134* 133*  --  133* 133*  K 5.3*  --  4.3 5.6* 4.8 4.9  --  3.7 3.7  CL 112*  --  110 106 105 105  --  98 100  CO2 19*  --  19* 18* 17* 18*  --  25 25  GLUCOSE 157*  --  110* 126* 117* 117*  --  136* 115*  BUN 68*  --  65* 74* 77* 85*  --  41* 43*  CREATININE 6.26* 6.58* 6.45* 7.51* 7.91* 8.49*  --  5.12* 5.47*  CALCIUM 8.9  --  7.9* 7.8* 7.7* 7.7*  --  7.9* 8.2*  PHOS  --   --   --   --   --   --  8.5* 4.6 5.5*   CBC Recent Labs  Lab 12/31/20 1601 01/01/21 0049 01/04/21 0853 01/05/21 1311 01/06/21 0412 01/07/21 0530  WBC 8.2   < > 4.0 7.1 5.8 4.6  NEUTROABS 6.6  --   --   --   --   --   HGB 10.2*   < > 8.5* 7.8* 7.2* 7.0*  HCT 32.8*   < > 27.9* 25.3* 22.8* 22.4*  MCV  82.6   < > 84.5 84.3 82.9 84.2  PLT 280   < > 205 199 183 181   < > = values in this interval not displayed.    Medications:     amitriptyline  100 mg Oral QHS   amLODipine  10 mg Oral Daily   atorvastatin  20 mg Oral Daily   buPROPion  150 mg Oral Daily   calcium acetate  1,334 mg Oral TID WC   Chlorhexidine Gluconate Cloth  6 each Topical Q0600   darbepoetin (ARANESP) injection - DIALYSIS  100 mcg Intravenous Q Wed-HD   heparin  5,000 Units Subcutaneous Q8H   hydrALAZINE  50 mg Oral TID   hydrocerin   Topical BID   insulin aspart  0-6 Units Subcutaneous TID WC   levothyroxine  88 mcg Oral Daily   metoprolol succinate  25 mg Oral Daily   sodium bicarbonate  650 mg Oral BID   Elmarie Shiley, MD 01/07/2021, 10:29 AM

## 2021-01-07 NOTE — TOC Progression Note (Addendum)
Transition of Care [facility]) - Progression Note    Patient Details  Name: [name] [name] MRN: [id number] Date of Birth: November 01, 1975  Transition of Care [facility] CM/SW Contact  Tom-Johnson, Renea Ee, RN Phone Number: 01/07/2021, 11:14 AM  Clinical Narrative:    Spoke with Linus Orn [phone]) about outpatient dialysis for patient. States he is waiting for schedule and will notify me when he gets the schedule. MD made are. Will continue to follow for TOC needs.   Expected Discharge Plan: Home/Self Care Barriers to Discharge: Continued Medical Work up  Expected Discharge Plan and Services Expected Discharge Plan: Home/Self Care       Living arrangements for the past 2 months: Single Family Home                                       Social Determinants of Health (SDOH) Interventions    Readmission Risk Interventions Readmission Risk Prevention Plan 10/31/2019 10/14/2019  Transportation Screening Complete Complete  PCP or Specialist Appt within 3-5 Days - Not Complete  Not Complete comments - plan for rehab  [place] or [place] - Complete  Social Work Consult for [place] Planning/Counseling - Complete  Palliative Care Screening - Not Applicable  Medication Review Press photographer) Complete Referral to Pharmacy  PCP or Specialist appointment within 3-5 days of discharge Complete -  [place] or Home Care Consult Complete -  SW Recovery Care/Counseling Consult Complete -  Palliative Care Screening Not Applicable -  [place] Not Applicable -  Some recent data might be hidden

## 2021-01-07 NOTE — Plan of Care (Signed)
  Problem: Education: Goal: Knowledge of General Education information will improve Description Including pain rating scale, medication(s)/side effects and non-pharmacologic comfort measures Outcome: Progressing   Problem: Health Behavior/Discharge Planning: Goal: Ability to manage health-related needs will improve Outcome: Progressing   

## 2021-01-07 NOTE — Procedures (Signed)
Patient seen on Hemodialysis. BP 115/73   Pulse 77   Temp 97.6 F (36.4 C) (Oral)   Resp (!) 21   Ht 5\' 9"  (1.753 m)   Wt (!) 137.5 kg   SpO2 98%   BMI 44.76 kg/m   QB 300, UF goal 1.5L Tolerating treatment without complaints at this time.   Elmarie Shiley MD [facility]. Office # [phone] Pager # [phone] 2:04 PM

## 2021-01-07 NOTE — Progress Notes (Signed)
PROGRESS NOTE    [name] [name]  ZLD:[id number] DOB: 1975/10/15 DOA: 12/31/2020 PCP: Riesa Pope, MD    Brief Narrative:  This 45 year old female with history of CKD stage IV, hypertension, diabetes mellitus type 2, tobacco abuse, anemia of chronic disease, chronic, left AKA, morbid obesity, chronic pain presented in the ED with left flank pain associated with nausea and vomiting.  On presentation, she was found to have worsening AKI with metabolic acidosis and possible UTI.  CT of the abdomen and pelvis showed features concerning for cystitis, gallstones with no cholecystitis.  She was started on IV fluids and antibiotics.  Nephrology was consulted who recommended transfer patient to [facility] for possible need of hemodialysis.  Patient is started on hemodialysis, tolerated well.  Plan is to set up outpatient hemodialysis before she can be discharged.  Assessment & Plan:   Principal Problem:   ARF (acute renal failure) (HCC) Active Problems:   Controlled type 2 diabetes mellitus (HCC)   OSA (obstructive sleep apnea)   Pyelonephritis   Hypertensive urgency   Acute renal failure (ARF) (HCC)  AKI on CKD stage IV : Progression of CKD stage IV now end-stage renal disease requiring hemodialysis Patient presented with worsening renal functions associated with nausea and vomiting. Nephrology consulted patient transferred to [facility] for dialysis. Creatinine 8.49 on 01/04/2021, 7.51 on 01/02/2021.   Hold nephrotoxic medications including lisinopril and Bumex Strict input and output.  Daily weights.  Fluid restriction.   She underwent tunneled dialysis catheter followed by hemodialysis on 8/16. Patient had right IJ tunneled dialysis catheter and left brachiocephalic fistula placed on 8/16. Patient has undergone 2 dialysis sessions so far, today is third session dialysis. Metabolic acidosis has resolved.  Process underway for outpatient dialysis unit placement Plan is  to set up outpatient hemodialysis set before she can be discharged.   Hyperkalemia Improved with HD.  Hypertensive urgency > Improved. Blood pressure currently stable.   Continue amlodipine, metoprolol, hydralazine.   Bumex and lisinopril on hold.   Suspect acute pyelonephritis Urine culture grew multiple species.  Continue Rocephin.   Hypothyroidism:  Continue Synthroid  Diabetes mellitus type 2 -A1c 6 in April 2022.   Continue CBGs with SSI   OSA not using CPAP Noncompliant with CPAP.  History of left AKA : Stable,  Morbid obesity Outpatient follow-up  Chronic pain syndrome Continue as needed opiates.  Outpatient follow-up with PCP/pain management   Anemia of chronic disease  -hemoglobin stable @ 7 .  No signs of bleeding.  Monitor H/H   DVT prophylaxis: Heparin Code Status: Full code. Family Communication: No family at bed side. Disposition Plan:    Status is: Inpatient  Remains inpatient appropriate because:Inpatient level of care appropriate due to severity of illness  Dispo: The patient is from: Home              Anticipated d/c is to: Home              Patient currently is not medically stable to d/c.   Difficult to place patient No  Consultants:  Nephrology Vascular Sx.  Procedures: Tunneled dialysis catheter, AV fistula  Antimicrobials:   Anti-infectives (From admission, onward)    Start     Dose/Rate Route Frequency Ordered Stop   01/05/21 0741  ceFAZolin (ANCEF) 2-4 GM/100ML-% IVPB       Note to Pharmacy: Mendel Corning   : cabinet override      01/05/21 0741 01/05/21 1944   01/01/21 2000  cefTRIAXone (ROCEPHIN) 2 g in sodium chloride 0.9 % 100 mL IVPB        2 g 200 mL/hr over 30 Minutes Intravenous Every 24 hours 12/31/20 2341     12/31/20 2015  cefTRIAXone (ROCEPHIN) 1 g in sodium chloride 0.9 % 100 mL IVPB        1 g 200 mL/hr over 30 Minutes Intravenous  Once 12/31/20 2003 12/31/20 2105        Subjective: Patient was seen and  examined at bedside.  Overnight events noted.  She reports feeling much improved.  Patient is going to have third hemodialysis session today,  process is underway for outpatient hemodialysis placement.    Objective: Vitals:   01/06/21 2020 01/06/21 2046 01/07/21 0600 01/07/21 0933  BP: (!) 149/75 (!) 147/75 128/77 123/70  Pulse: 92 92 78 83  Resp: 16  17 18   Temp: 98.8 F (37.1 C) 98 F (36.7 C) 97.7 F (36.5 C) 98.1 F (36.7 C)  TempSrc: Oral Oral Oral Oral  SpO2: 98% 100% 96% 96%  Weight: (!) 136.6 kg  (!) 137.4 kg   Height:        Intake/Output Summary (Last 24 hours) at 01/07/2021 1327 Last data filed at 01/07/2021 0934 Gross per 24 hour  Intake 1190 ml  Output 2125 ml  Net -935 ml   Filed Weights   01/06/21 1733 01/06/21 2020 01/07/21 0600  Weight: (!) 137.6 kg (!) 136.6 kg (!) 137.4 kg    Examination:  General exam: Appears comfortable, not in any acute distress. Respiratory system: Clear to auscultation bilaterally, RR 15 Cardiovascular system: S1-S2 heard, regular rate and rhythm, no murmur  Gastrointestinal system: Abdomen is soft, nontender, nondistended, normal bowel sounds heard.   Central nervous system: Alert and oriented x3, no focal neurological deficits. Extremities: Left AKA, right lower extremity 1+ pitting edema. Skin: No rash Psychiatry: Mood and affect appropriate   Data Reviewed: I have personally reviewed following labs and imaging studies  CBC: Recent Labs  Lab 12/31/20 1601 01/01/21 0049 01/03/21 0511 01/04/21 0853 01/05/21 1311 01/06/21 0412 01/07/21 0530  WBC 8.2   < > 4.6 4.0 7.1 5.8 4.6  NEUTROABS 6.6  --   --   --   --   --   --   HGB 10.2*   < > 7.7* 8.5* 7.8* 7.2* 7.0*  HCT 32.8*   < > 25.5* 27.9* 25.3* 22.8* 22.4*  MCV 82.6   < > 85.3 84.5 84.3 82.9 84.2  PLT 280   < > 184 205 199 183 181   < > = values in this interval not displayed.   Basic Metabolic Panel: Recent Labs  Lab 01/02/21 0553 01/03/21 0511 01/04/21 0853  01/04/21 1314 01/06/21 2219 01/07/21 0530  NA 133* 134* 133*  --  133* 133*  K 5.6* 4.8 4.9  --  3.7 3.7  CL 106 105 105  --  98 100  CO2 18* 17* 18*  --  25 25  GLUCOSE 126* 117* 117*  --  136* 115*  BUN 74* 77* 85*  --  41* 43*  CREATININE 7.51* 7.91* 8.49*  --  5.12* 5.47*  CALCIUM 7.8* 7.7* 7.7*  --  7.9* 8.2*  MG  --   --   --   --   --  2.0  PHOS  --   --   --  8.5* 4.6 5.5*   GFR: Estimated Creatinine Clearance: 19.6 mL/min (A) (by C-G formula based on  SCr of 5.47 mg/dL (H)). Liver Function Tests: Recent Labs  Lab 12/31/20 1601 01/01/21 0600 01/06/21 2219  AST 10* 10*  --   ALT 9 9  --   ALKPHOS 75 60  --   BILITOT 0.5 0.5  --   PROT 7.6 6.1*  --   ALBUMIN 3.4* 2.7* 2.8*   Recent Labs  Lab 12/31/20 1601  LIPASE 37   No results for input(s): AMMONIA in the last 168 hours. Coagulation Profile: No results for input(s): INR, PROTIME in the last 168 hours. Cardiac Enzymes: Recent Labs  Lab 01/01/21 0600  CKTOTAL 101   BNP (last 3 results) No results for input(s): PROBNP in the last 8760 hours. HbA1C: No results for input(s): HGBA1C in the last 72 hours. CBG: Recent Labs  Lab 01/06/21 1123 01/06/21 1647 01/06/21 2152 01/07/21 0729 01/07/21 1128  GLUCAP 155* 121* 136* 111* 173*   Lipid Profile: No results for input(s): CHOL, HDL, LDLCALC, TRIG, CHOLHDL, LDLDIRECT in the last 72 hours. Thyroid Function Tests: No results for input(s): TSH, T4TOTAL, FREET4, T3FREE, THYROIDAB in the last 72 hours. Anemia Panel: No results for input(s): VITAMINB12, FOLATE, FERRITIN, TIBC, IRON, RETICCTPCT in the last 72 hours.  Sepsis Labs: No results for input(s): PROCALCITON, LATICACIDVEN in the last 168 hours.  Recent Results (from the past 240 hour(s))  Resp Panel by RT-PCR (Flu A&B, Covid) Nasopharyngeal Swab     Status: None   Collection Time: 12/31/20  6:24 PM   Specimen: Nasopharyngeal Swab; Nasopharyngeal(NP) swabs in vial transport medium  Result Value Ref  Range Status   SARS Coronavirus 2 by RT PCR NEGATIVE NEGATIVE Final    Comment: (NOTE) SARS-CoV-2 target nucleic acids are NOT DETECTED.  The SARS-CoV-2 RNA is generally detectable in upper respiratory specimens during the acute phase of infection. The lowest concentration of SARS-CoV-2 viral copies this assay can detect is 138 copies/mL. A negative result does not preclude SARS-Cov-2 infection and should not be used as the sole basis for treatment or other patient management decisions. A negative result may occur with  improper specimen collection/handling, submission of specimen other than nasopharyngeal swab, presence of viral mutation(s) within the areas targeted by this assay, and inadequate number of viral copies(<138 copies/mL). A negative result must be combined with clinical observations, patient history, and epidemiological information. The expected result is Negative.  Fact Sheet for Patients:  EntrepreneurPulse.com.au  Fact Sheet for Healthcare Providers:  IncredibleEmployment.be  This test is no t yet approved or cleared by the [place] FDA and  has been authorized for detection and/or diagnosis of SARS-CoV-2 by FDA under an Emergency Use Authorization (EUA). This EUA will remain  in effect (meaning this test can be used) for the duration of the COVID-19 declaration under Section 564(b)(1) of the Act, 21 U.S.C.section 360bbb-3(b)(1), unless the authorization is terminated  or revoked sooner.       Influenza A by PCR NEGATIVE NEGATIVE Final   Influenza B by PCR NEGATIVE NEGATIVE Final    Comment: (NOTE) The Xpert Xpress SARS-CoV-2/FLU/RSV plus assay is intended as an aid in the diagnosis of influenza from Nasopharyngeal swab specimens and should not be used as a sole basis for treatment. Nasal washings and aspirates are unacceptable for Xpert Xpress SARS-CoV-2/FLU/RSV testing.  Fact Sheet for  Patients: EntrepreneurPulse.com.au  Fact Sheet for Healthcare Providers: IncredibleEmployment.be  This test is not yet approved or cleared by the [place] FDA and has been authorized for detection and/or diagnosis of SARS-CoV-2 by FDA under  an Emergency Use Authorization (EUA). This EUA will remain in effect (meaning this test can be used) for the duration of the COVID-19 declaration under Section 564(b)(1) of the Act, 21 U.S.C. section 360bbb-3(b)(1), unless the authorization is terminated or revoked.  Performed at [facility], [place] [address] [place], [place] [address]   Urine Culture     Status: Abnormal   Collection Time: 01/01/21 12:49 AM   Specimen: Urine, Clean Catch  Result Value Ref Range Status   Specimen Description   Final    URINE, CLEAN CATCH Performed at [facility], [place] [address], [place], [place] [address]    Special Requests   Final    NONE Performed at [facility], [place] [address] [place], [place] [address]    Culture MULTIPLE SPECIES PRESENT, SUGGEST RECOLLECTION (A)  Final   Report Status 01/02/2021 FINAL  Final         Radiology Studies: No results found.  Scheduled Meds:  amitriptyline  100 mg Oral QHS   amLODipine  10 mg Oral Daily   atorvastatin  20 mg Oral Daily   buPROPion  150 mg Oral Daily   calcium acetate  1,334 mg Oral TID WC   Chlorhexidine Gluconate Cloth  6 each Topical Q0600   darbepoetin (ARANESP) injection - DIALYSIS  100 mcg Intravenous Q Wed-HD   heparin  5,000 Units Subcutaneous Q8H   hydrALAZINE  50 mg Oral TID   hydrocerin   Topical BID   insulin aspart  0-6 Units Subcutaneous TID WC   levothyroxine  88 mcg Oral Daily   metoprolol succinate  25 mg Oral Daily   Continuous Infusions:  cefTRIAXone (ROCEPHIN)  IV 2 g (01/06/21 2306)   ferric gluconate (FERRLECIT) IVPB 250 mg (01/06/21 2353)     LOS: 6 days    Time  spent: [place], MD [facility]   If 7PM-7AM, please contact night-coverage

## 2021-01-07 NOTE — Plan of Care (Signed)
Nutrition Education Note  RD consulted for Renal Education. Provided Renal Food Guide Pyramid to patient/family. Reviewed food groups and provided written recommended serving sizes specifically determined for patient's current nutritional status.   Explained why diet restrictions are needed and provided lists of foods to limit/avoid that are high potassium, sodium, and phosphorus. Provided specific recommendations on safer alternatives of these foods. Strongly encouraged compliance of this diet.   Discussed importance of protein intake at each meal and snack. Provided examples of how to maximize protein intake throughout the day. Discussed need for fluid restriction with dialysis, importance of minimizing weight gain between HD treatments, and renal-friendly beverage options.  Encouraged pt to discuss specific diet questions/concerns with RD at HD outpatient facility. Teach back method used.  Expect fair compliance.  Body mass index is 44.73 kg/m. Pt meets criteria for morbid obesity based on current BMI.  Current diet order is renal/carb modified, patient is consuming approximately 100% of meals at this time. Labs and medications reviewed. No further nutrition interventions warranted at this time. RD contact information provided. If additional nutrition issues arise, please re-consult RD.  [name] Ina, MS, RD, LDN (she/her/hers) RD pager number and weekend/on-call pager number located in [place].

## 2021-01-08 ENCOUNTER — Ambulatory Visit: Payer: Medicare HMO | Admitting: Physical Therapy

## 2021-01-08 DIAGNOSIS — N179 Acute kidney failure, unspecified: Secondary | ICD-10-CM | POA: Diagnosis not present

## 2021-01-08 LAB — GLUCOSE, CAPILLARY
Glucose-Capillary: 113 mg/dL — ABNORMAL HIGH (ref 70–99)
Glucose-Capillary: 169 mg/dL — ABNORMAL HIGH (ref 70–99)
Glucose-Capillary: 143 mg/dL — ABNORMAL HIGH (ref 70–99)
Glucose-Capillary: 190 mg/dL — ABNORMAL HIGH (ref 70–99)

## 2021-01-08 LAB — CBC
HCT: 24.8 % — ABNORMAL LOW (ref 36.0–46.0)
Hemoglobin: 7.4 g/dL — ABNORMAL LOW (ref 12.0–15.0)
MCH: 25.7 pg — ABNORMAL LOW (ref 26.0–34.0)
MCHC: 29.8 g/dL — ABNORMAL LOW (ref 30.0–36.0)
MCV: 86.1 fL (ref 80.0–100.0)
Platelets: 216 10*3/uL (ref 150–400)
RBC: 2.88 MIL/uL — ABNORMAL LOW (ref 3.87–5.11)
RDW: 14.5 % (ref 11.5–15.5)
WBC: 5.5 10*3/uL (ref 4.0–10.5)
nRBC: 0 % (ref 0.0–0.2)

## 2021-01-08 MED ORDER — DARBEPOETIN ALFA 100 MCG/0.5ML IJ SOSY: 100.0000 ug | PREFILLED_SYRINGE | INTRAMUSCULAR | Status: DC

## 2021-01-08 MED ORDER — CHLORHEXIDINE GLUCONATE CLOTH 2 % EX PADS
6.0000 | MEDICATED_PAD | Freq: Every day | CUTANEOUS | Status: DC
  Administered 2021-01-09 – 2021-01-12 (×4): 6 via TOPICAL

## 2021-01-08 NOTE — Plan of Care (Signed)

## 2021-01-08 NOTE — Progress Notes (Addendum)
Out Patient Arrangements:  Pt will not be able to start until 01-12-21. Please go to the clinic on Monday, 01-11-21, to sign paperwork.  Pt has been accepted @ [facility] on their TTS shift w/ a time of 7:30 am.  The address is  [place], [place]  Please advise of d/c date.  Linus Orn [facility] [phone]

## 2021-01-08 NOTE — Progress Notes (Signed)
PROGRESS NOTE    [name] [name]  EHM:[id number] DOB: 1976/01/30 DOA: 12/31/2020 PCP: Riesa Pope, MD    Brief Narrative:  This 45 year old female with history of CKD stage IV, hypertension, diabetes mellitus type 2, tobacco abuse, anemia of chronic disease, chronic, left AKA, morbid obesity, chronic pain presented in the ED with left flank pain associated with nausea and vomiting.  On presentation, she was found to have worsening AKI with metabolic acidosis and possible UTI.  CT of the abdomen and pelvis showed features concerning for cystitis, gallstones with no cholecystitis.  She was started on IV fluids and antibiotics.  Nephrology was consulted who recommended transfer patient to [facility] for possible need of hemodialysis.  Patient is started on hemodialysis, tolerated well.  Plan is to set up outpatient hemodialysis before she can be discharged.  Assessment & Plan:   Principal Problem:   ARF (acute renal failure) (HCC) Active Problems:   Controlled type 2 diabetes mellitus (HCC)   OSA (obstructive sleep apnea)   Pyelonephritis   Hypertensive urgency   Acute renal failure (ARF) (HCC)  AKI on CKD stage IV : Progression of CKD stage IV now end-stage renal disease requiring hemodialysis Patient presented with worsening renal functions associated with nausea and vomiting. Nephrology consulted patient transferred to [facility] for dialysis. Creatinine 8.49 on 01/04/2021, 7.51 on 01/02/2021.   Hold nephrotoxic medications including lisinopril and Bumex Strict input and output.  Daily weights.  Fluid restriction.   She underwent tunneled dialysis catheter followed by hemodialysis on 8/16. Patient had right IJ tunneled dialysis catheter and left brachiocephalic fistula placed on 8/16. Patient has undergone 3 dialysis sessions so far,  Next dialysis on 8/20. Metabolic acidosis has resolved.  Process underway for outpatient dialysis unit placement Plan is to set up  outpatient hemodialysis before she can be discharged.   Hyperkalemia Improved with HD.  Hypertensive urgency > Improved. Blood pressure currently stable.   Continue amlodipine, metoprolol, hydralazine.   Bumex and lisinopril on hold.   Suspect acute pyelonephritis Completed ceftriaxone for 7 days. Urine culture : Polymicrobial.   Hypothyroidism:  Continue Synthroid  Diabetes mellitus type 2 -A1c 6 in April 2022.   Continue CBGs with SSI   OSA not using CPAP Noncompliant with CPAP.  History of left AKA : Stable,  Morbid obesity Outpatient follow-up  Chronic pain syndrome Continue as needed opiates.  Outpatient follow-up with PCP/pain management   Anemia of chronic disease  -hemoglobin stable @ 7.4 .  No signs of bleeding.  Monitor H/H   DVT prophylaxis: Heparin Code Status: Full code. Family Communication: No family at bed side. Disposition Plan:    Status is: Inpatient  Remains inpatient appropriate because:Inpatient level of care appropriate due to severity of illness  Dispo: The patient is from: Home              Anticipated d/c is to: Home              Patient currently is not medically stable to d/c.   Difficult to place patient No  Consultants:  Nephrology Vascular Sx.  Procedures: Tunneled dialysis catheter, AV fistula  Antimicrobials:   Anti-infectives (From admission, onward)    Start     Dose/Rate Route Frequency Ordered Stop   01/05/21 0741  ceFAZolin (ANCEF) 2-4 GM/100ML-% IVPB       Note to Pharmacy: Mendel Corning   : cabinet override      01/05/21 0741 01/05/21 1944   01/01/21 2000  cefTRIAXone (ROCEPHIN) 2 g in sodium chloride 0.9 % 100 mL IVPB  Status:  Discontinued        2 g 200 mL/hr over 30 Minutes Intravenous Every 24 hours 12/31/20 2341 01/08/21 0946   12/31/20 2015  cefTRIAXone (ROCEPHIN) 1 g in sodium chloride 0.9 % 100 mL IVPB        1 g 200 mL/hr over 30 Minutes Intravenous  Once 12/31/20 2003 12/31/20 2105         Subjective: Patient was seen and examined at bedside.  Overnight events noted.   She reports feeling much improved.  She was having breakfast. process is underway for outpatient hemodialysis placement.    Objective: Vitals:   01/07/21 1658 01/07/21 2118 01/08/21 0622 01/08/21 1037  BP: (!) 174/89 129/71 122/86 (!) 169/89  Pulse: 90 76 71 90  Resp: 18 17 18 18   Temp: 98.4 F (36.9 C) 97.7 F (36.5 C) 97.8 F (36.6 C) (!) 97.4 F (36.3 C)  TempSrc:  Oral Oral Oral  SpO2: 96% 97% 95% 97%  Weight:      Height:        Intake/Output Summary (Last 24 hours) at 01/08/2021 1337 Last data filed at 01/08/2021 0700 Gross per 24 hour  Intake 580 ml  Output 1500 ml  Net -920 ml   Filed Weights   01/07/21 0600 01/07/21 1250 01/07/21 1600  Weight: (!) 137.4 kg (!) 137.5 kg 136 kg    Examination:  General exam: Appears comfortable, not in any acute distress. Respiratory system: Clear to auscultation bilaterally, no wheezes. Cardiovascular system: S1-S2 heard, regular rate and rhythm, no murmur Gastrointestinal system: Abdomen is soft, nontender, nondistended, normal bowel sounds heard.   Central nervous system: Alert and oriented x3, no focal neurological deficits. Extremities: Left AKA, right lower extremity 1+ pitting edema. Skin: No rash Psychiatry: Mood and affect appropriate   Data Reviewed: I have personally reviewed following labs and imaging studies  CBC: Recent Labs  Lab 01/04/21 0853 01/05/21 1311 01/06/21 0412 01/07/21 0530 01/08/21 0352  WBC 4.0 7.1 5.8 4.6 5.5  HGB 8.5* 7.8* 7.2* 7.0* 7.4*  HCT 27.9* 25.3* 22.8* 22.4* 24.8*  MCV 84.5 84.3 82.9 84.2 86.1  PLT 205 199 183 181 093   Basic Metabolic Panel: Recent Labs  Lab 01/02/21 0553 01/03/21 0511 01/04/21 0853 01/04/21 1314 01/06/21 2219 01/07/21 0530  NA 133* 134* 133*  --  133* 133*  K 5.6* 4.8 4.9  --  3.7 3.7  CL 106 105 105  --  98 100  CO2 18* 17* 18*  --  25 25  GLUCOSE 126* 117* 117*   --  136* 115*  BUN 74* 77* 85*  --  41* 43*  CREATININE 7.51* 7.91* 8.49*  --  5.12* 5.47*  CALCIUM 7.8* 7.7* 7.7*  --  7.9* 8.2*  MG  --   --   --   --   --  2.0  PHOS  --   --   --  8.5* 4.6 5.5*   GFR: Estimated Creatinine Clearance: 19.5 mL/min (A) (by C-G formula based on SCr of 5.47 mg/dL (H)). Liver Function Tests: Recent Labs  Lab 01/06/21 2219  ALBUMIN 2.8*   No results for input(s): LIPASE, AMYLASE in the last 168 hours.  No results for input(s): AMMONIA in the last 168 hours. Coagulation Profile: No results for input(s): INR, PROTIME in the last 168 hours. Cardiac Enzymes: No results for input(s): CKTOTAL, CKMB, CKMBINDEX, TROPONINI in the last  168 hours.  BNP (last 3 results) No results for input(s): PROBNP in the last 8760 hours. HbA1C: No results for input(s): HGBA1C in the last 72 hours. CBG: Recent Labs  Lab 01/07/21 0729 01/07/21 1128 01/07/21 1656 01/08/21 0644 01/08/21 1202  GLUCAP 111* 173* 83 113* 169*   Lipid Profile: No results for input(s): CHOL, HDL, LDLCALC, TRIG, CHOLHDL, LDLDIRECT in the last 72 hours. Thyroid Function Tests: No results for input(s): TSH, T4TOTAL, FREET4, T3FREE, THYROIDAB in the last 72 hours. Anemia Panel: No results for input(s): VITAMINB12, FOLATE, FERRITIN, TIBC, IRON, RETICCTPCT in the last 72 hours.  Sepsis Labs: No results for input(s): PROCALCITON, LATICACIDVEN in the last 168 hours.  Recent Results (from the past 240 hour(s))  Resp Panel by RT-PCR (Flu A&B, Covid) Nasopharyngeal Swab     Status: None   Collection Time: 12/31/20  6:24 PM   Specimen: Nasopharyngeal Swab; Nasopharyngeal(NP) swabs in vial transport medium  Result Value Ref Range Status   SARS Coronavirus 2 by RT PCR NEGATIVE NEGATIVE Final    Comment: (NOTE) SARS-CoV-2 target nucleic acids are NOT DETECTED.  The SARS-CoV-2 RNA is generally detectable in upper respiratory specimens during the acute phase of infection. The lowest concentration  of SARS-CoV-2 viral copies this assay can detect is 138 copies/mL. A negative result does not preclude SARS-Cov-2 infection and should not be used as the sole basis for treatment or other patient management decisions. A negative result may occur with  improper specimen collection/handling, submission of specimen other than nasopharyngeal swab, presence of viral mutation(s) within the areas targeted by this assay, and inadequate number of viral copies(<138 copies/mL). A negative result must be combined with clinical observations, patient history, and epidemiological information. The expected result is Negative.  Fact Sheet for Patients:  EntrepreneurPulse.com.au  Fact Sheet for Healthcare Providers:  IncredibleEmployment.be  This test is no t yet approved or cleared by the [place] FDA and  has been authorized for detection and/or diagnosis of SARS-CoV-2 by FDA under an Emergency Use Authorization (EUA). This EUA will remain  in effect (meaning this test can be used) for the duration of the COVID-19 declaration under Section 564(b)(1) of the Act, 21 U.S.C.section 360bbb-3(b)(1), unless the authorization is terminated  or revoked sooner.       Influenza A by PCR NEGATIVE NEGATIVE Final   Influenza B by PCR NEGATIVE NEGATIVE Final    Comment: (NOTE) The Xpert Xpress SARS-CoV-2/FLU/RSV plus assay is intended as an aid in the diagnosis of influenza from Nasopharyngeal swab specimens and should not be used as a sole basis for treatment. Nasal washings and aspirates are unacceptable for Xpert Xpress SARS-CoV-2/FLU/RSV testing.  Fact Sheet for Patients: EntrepreneurPulse.com.au  Fact Sheet for Healthcare Providers: IncredibleEmployment.be  This test is not yet approved or cleared by the [place] FDA and has been authorized for detection and/or diagnosis of SARS-CoV-2 by FDA under an Emergency Use  Authorization (EUA). This EUA will remain in effect (meaning this test can be used) for the duration of the COVID-19 declaration under Section 564(b)(1) of the Act, 21 U.S.C. section 360bbb-3(b)(1), unless the authorization is terminated or revoked.  Performed at [facility], [place] [address] [place], [place] [address]   Urine Culture     Status: Abnormal   Collection Time: 01/01/21 12:49 AM   Specimen: Urine, Clean Catch  Result Value Ref Range Status   Specimen Description   Final    URINE, CLEAN CATCH Performed at [facility], [address] [place], [place] [address]    Special Requests   Final    NONE Performed at [facility], [place] [address] [place], [place] [address]    Culture MULTIPLE SPECIES PRESENT, SUGGEST RECOLLECTION (A)  Final   Report Status 01/02/2021 FINAL  Final         Radiology Studies: No results found.  Scheduled Meds:  amitriptyline  100 mg Oral QHS   amLODipine  10 mg Oral Daily   atorvastatin  20 mg Oral Daily   buPROPion  150 mg Oral Daily   calcium acetate  1,334 mg Oral TID WC   [START ON 01/09/2021] Chlorhexidine Gluconate Cloth  6 each Topical Q0600   [START ON 01/14/2021] darbepoetin (ARANESP) injection - DIALYSIS  100 mcg Intravenous Q Thu-HD   heparin  5,000 Units Subcutaneous Q8H   hydrALAZINE  50 mg Oral TID   hydrocerin   Topical BID   insulin aspart  0-6 Units Subcutaneous TID WC   levothyroxine  88 mcg Oral Daily   metoprolol succinate  25 mg Oral Daily   Continuous Infusions:     LOS: 7 days    Time spent: 25 MINS    Sarahy Creedon, MD [facility]   If 7PM-7AM, please contact night-coverage

## 2021-01-08 NOTE — Progress Notes (Signed)
Out Patient Arrangements:  Spoke w/ pt. She is not agreeable w/ HD arrangement. Currently wking on her a new clinic.   Linus Orn [facility] [phone]

## 2021-01-08 NOTE — Progress Notes (Signed)
[name] NEPHROLOGY PROGRESS NOTE  Assessment/ Plan:  # End-stage renal disease: Following progression of chronic kidney disease stage V (proteinuric diabetic nephropathy). She has a right IJ TDC and left brachiocephalic placed by Dr. Scot Dock 8/16.  Process underway for outpatient dialysis unit placement (she does not want to be placed at [facility] where her late mother, [name] [name] used to dialysis). Left message to Renal navigator.  Plan for next HD tomorrow.   # Volume overload: Not responding with diuretics therefore switching to long-term hemodialysis.  UF as tolerated.   # Hyponatremia: Secondary to volume excess and declining renal function/impaired free water excretion.  Monitor with hemodialysis.  #  Flank pain/pyuria/bladder wall thickening: Suspect to be consistent with cystitis/UTI-treated with ceftriaxone-urine culture polymicrobial.  # Anemia of chronic disease: Iron deficiency seen on labs-- begin IV Fe along with ESA. Monitor Hb.   # CKD-MBD: Currently on PhosLo.  Monitor calcium phosphorus level.  Subjective: Seen and examined at bedside.  She is eating lunch.  Denies nausea vomiting chest pain shortness of breath.  She confirms that she does not want to go to [facility] kidney center at [facility].  She also has problem with transportation very early in the morning. Objective Vital signs in last 24 hours: Vitals:   01/07/21 1658 01/07/21 2118 01/08/21 0622 01/08/21 1037  BP: (!) 174/89 129/71 122/86 (!) 169/89  Pulse: 90 76 71 90  Resp: 18 17 18 18   Temp: 98.4 F (36.9 C) 97.7 F (36.5 C) 97.8 F (36.6 C) (!) 97.4 F (36.3 C)  TempSrc:  Oral Oral Oral  SpO2: 96% 97% 95% 97%  Weight:      Height:       Weight change: -0.1 kg  Intake/Output Summary (Last 24 hours) at 01/08/2021 1253 Last data filed at 01/08/2021 0700 Gross per 24 hour  Intake 580 ml  Output 1500 ml  Net -920 ml       Labs: Basic Metabolic  Panel: Recent Labs  Lab 01/04/21 0853 01/04/21 1314 01/06/21 2219 01/07/21 0530  NA 133*  --  133* 133*  K 4.9  --  3.7 3.7  CL 105  --  98 100  CO2 18*  --  25 25  GLUCOSE 117*  --  136* 115*  BUN 85*  --  41* 43*  CREATININE 8.49*  --  5.12* 5.47*  CALCIUM 7.7*  --  7.9* 8.2*  PHOS  --  8.5* 4.6 5.5*   Liver Function Tests: Recent Labs  Lab 01/06/21 2219  ALBUMIN 2.8*   No results for input(s): LIPASE, AMYLASE in the last 168 hours. No results for input(s): AMMONIA in the last 168 hours. CBC: Recent Labs  Lab 01/04/21 0853 01/05/21 1311 01/06/21 0412 01/07/21 0530 01/08/21 0352  WBC 4.0 7.1 5.8 4.6 5.5  HGB 8.5* 7.8* 7.2* 7.0* 7.4*  HCT 27.9* 25.3* 22.8* 22.4* 24.8*  MCV 84.5 84.3 82.9 84.2 86.1  PLT 205 199 183 181 216   Cardiac Enzymes: No results for input(s): CKTOTAL, CKMB, CKMBINDEX, TROPONINI in the last 168 hours. CBG: Recent Labs  Lab 01/07/21 0729 01/07/21 1128 01/07/21 1656 01/08/21 0644 01/08/21 1202  GLUCAP 111* 173* 83 113* 169*    Iron Studies: No results for input(s): IRON, TIBC, TRANSFERRIN, FERRITIN in the last 72 hours. Studies/Results: No results found.  Medications: Infusions:   Scheduled Medications:  amitriptyline  100 mg Oral QHS   amLODipine  10 mg Oral Daily   atorvastatin  20 mg Oral Daily   buPROPion  150 mg Oral Daily   calcium acetate  1,334 mg Oral TID WC   Chlorhexidine Gluconate Cloth  6 each Topical Q0600   darbepoetin (ARANESP) injection - DIALYSIS  100 mcg Intravenous Q Wed-HD   heparin  5,000 Units Subcutaneous Q8H   hydrALAZINE  50 mg Oral TID   hydrocerin   Topical BID   insulin aspart  0-6 Units Subcutaneous TID WC   levothyroxine  88 mcg Oral Daily   metoprolol succinate  25 mg Oral Daily    have reviewed scheduled and prn medications.  Physical Exam: General: Eating lunch, looks comfortable and not in distress Heart:RRR, s1s2 nl Lungs:clear b/l, no crackle Abdomen:soft, Non-tender,   Extremities: Trace LE edema Dialysis Access: Right IJ TDC in place, left upper extremity AV fistula wound healing well with good thrill.  [name] [name] [name] [name] 01/08/2021,12:53 PM  LOS: 7 days

## 2021-01-09 DIAGNOSIS — N179 Acute kidney failure, unspecified: Secondary | ICD-10-CM | POA: Diagnosis not present

## 2021-01-09 LAB — GLUCOSE, CAPILLARY
Glucose-Capillary: 120 mg/dL — ABNORMAL HIGH (ref 70–99)
Glucose-Capillary: 130 mg/dL — ABNORMAL HIGH (ref 70–99)
Glucose-Capillary: 158 mg/dL — ABNORMAL HIGH (ref 70–99)

## 2021-01-09 LAB — RENAL FUNCTION PANEL
Albumin: 2.8 g/dL — ABNORMAL LOW (ref 3.5–5.0)
Anion gap: 9 (ref 5–15)
BUN: 51 mg/dL — ABNORMAL HIGH (ref 6–20)
CO2: 24 mmol/L (ref 22–32)
Calcium: 8.6 mg/dL — ABNORMAL LOW (ref 8.9–10.3)
Chloride: 98 mmol/L (ref 98–111)
Creatinine, Ser: 5.89 mg/dL — ABNORMAL HIGH (ref 0.44–1.00)
GFR, Estimated: 8 mL/min — ABNORMAL LOW (ref >60)
Glucose, Bld: 225 mg/dL — ABNORMAL HIGH (ref 70–99)
Phosphorus: 5.8 mg/dL — ABNORMAL HIGH (ref 2.5–4.6)
Potassium: 3.9 mmol/L (ref 3.5–5.1)
Sodium: 131 mmol/L — ABNORMAL LOW (ref 135–145)

## 2021-01-09 LAB — CBC
HCT: 24.6 % — ABNORMAL LOW (ref 36.0–46.0)
Hemoglobin: 7.5 g/dL — ABNORMAL LOW (ref 12.0–15.0)
MCH: 26.4 pg (ref 26.0–34.0)
MCHC: 30.5 g/dL (ref 30.0–36.0)
MCV: 86.6 fL (ref 80.0–100.0)
Platelets: 215 10*3/uL (ref 150–400)
RBC: 2.84 MIL/uL — ABNORMAL LOW (ref 3.87–5.11)
RDW: 14.5 % (ref 11.5–15.5)
WBC: 5.8 10*3/uL (ref 4.0–10.5)
nRBC: 0 % (ref 0.0–0.2)

## 2021-01-09 LAB — HEMOGLOBIN AND HEMATOCRIT, BLOOD
HCT: 24.3 % — ABNORMAL LOW (ref 36.0–46.0)
Hemoglobin: 7.3 g/dL — ABNORMAL LOW (ref 12.0–15.0)

## 2021-01-09 MED ORDER — HEPARIN SODIUM (PORCINE) 1000 UNIT/ML DIALYSIS: 20.0000 [IU]/kg | INTRAMUSCULAR | Status: DC | PRN

## 2021-01-09 MED ORDER — SENNA 8.6 MG PO TABS
1.0000 | ORAL_TABLET | Freq: Two times a day (BID) | ORAL | Status: DC
  Administered 2021-01-09 – 2021-01-11 (×6): 8.6 mg via ORAL
  Filled 2021-01-09 (×6): qty 1

## 2021-01-09 MED ORDER — LIDOCAINE-PRILOCAINE 2.5-2.5 % EX CREA: 1.0000 "application " | TOPICAL_CREAM | CUTANEOUS | Status: DC | PRN

## 2021-01-09 MED ORDER — HEPARIN SODIUM (PORCINE) 1000 UNIT/ML DIALYSIS: 1000.0000 [IU] | INTRAMUSCULAR | Status: DC | PRN

## 2021-01-09 MED ORDER — SODIUM CHLORIDE 0.9 % IV SOLN: 100.0000 mL | INTRAVENOUS | Status: DC | PRN

## 2021-01-09 MED ORDER — PENTAFLUOROPROP-TETRAFLUOROETH EX AERO
1.0000 | INHALATION_SPRAY | CUTANEOUS | Status: DC | PRN
Start: 2021-01-09 — End: 2021-01-09

## 2021-01-09 MED ORDER — HEPARIN SODIUM (PORCINE) 1000 UNIT/ML IJ SOLN
INTRAMUSCULAR | Status: AC
  Filled 2021-01-09: qty 4

## 2021-01-09 MED ORDER — ALTEPLASE 2 MG IJ SOLR: 2.0000 mg | Freq: Once | INTRAMUSCULAR | Status: DC | PRN

## 2021-01-09 MED ORDER — LIDOCAINE HCL (PF) 1 % IJ SOLN: 5.0000 mL | INTRAMUSCULAR | Status: DC | PRN

## 2021-01-09 MED ORDER — SODIUM CHLORIDE 0.9 % IV SOLN
100.0000 mL | INTRAVENOUS | Status: DC | PRN
Start: 2021-01-09 — End: 2021-01-09

## 2021-01-09 NOTE — Progress Notes (Signed)
PROGRESS NOTE    [name] [name]  IPJ:[id number] DOB: 1975-06-27 DOA: 12/31/2020 PCP: Riesa Pope, MD    Brief Narrative:  This 45 year old female with history of CKD stage IV, hypertension, diabetes mellitus type 2, tobacco abuse, anemia of chronic disease, chronic, left AKA, morbid obesity, chronic pain presented in the ED with left flank pain associated with nausea and vomiting.  On presentation, she was found to have worsening AKI with metabolic acidosis and possible UTI.  CT of the abdomen and pelvis showed features concerning for cystitis, gallstones with no cholecystitis.  She was started on IV fluids and antibiotics.  Nephrology was consulted who recommended transfer patient to [facility] for possible need of hemodialysis.  Patient is started on hemodialysis, tolerated well.  Plan is to set up outpatient hemodialysis before she can be discharged.  Assessment & Plan:   Principal Problem:   ARF (acute renal failure) (HCC) Active Problems:   Controlled type 2 diabetes mellitus (HCC)   OSA (obstructive sleep apnea)   Pyelonephritis   Hypertensive urgency   Acute renal failure (ARF) (HCC)  AKI on CKD stage IV : Progression of CKD stage IV now end-stage renal disease requiring hemodialysis Patient presented with worsening renal functions associated with nausea and vomiting. Nephrology consulted patient transferred to [facility] for dialysis. Creatinine 8.49 on 01/04/2021, 7.51 on 01/02/2021.   Hold nephrotoxic medications including lisinopril and Bumex Strict input and output.  Daily weights.  Fluid restriction.   She underwent tunneled dialysis catheter followed by hemodialysis on 8/16. Patient had right IJ tunneled dialysis catheter and left brachiocephalic fistula placed on 8/16. Patient has undergone 3 dialysis sessions so far,  Next dialysis on 8/20. Metabolic acidosis has resolved.  Process underway for outpatient dialysis unit placement Plan is to set up  outpatient hemodialysis before she can be discharged.   Hyperkalemia. Improved with HD.  Hypertensive urgency > Improved. Blood pressure currently stable.   Continue amlodipine, metoprolol, hydralazine.   Bumex and lisinopril is on hold.   Suspect acute pyelonephritis Completed ceftriaxone for 7 days. Urine culture : Polymicrobial.   Hypothyroidism:  Continue Synthroid  Diabetes mellitus type 2 -A1c 6 in April 2022.   Continue CBGs with SSI   OSA not using CPAP Noncompliant with CPAP.  History of left AKA : Stable,  Morbid obesity Outpatient follow-up  Chronic pain syndrome Continue as needed opiates.  Outpatient follow-up with PCP/pain management   Anemia of chronic disease  -hemoglobin stable @ 7.4 .  No signs of bleeding.  Monitor H/H   DVT prophylaxis: Heparin Code Status: Full code. Family Communication: No family at bed side. Disposition Plan:    Status is: Inpatient  Remains inpatient appropriate because:Inpatient level of care appropriate due to severity of illness  Dispo: The patient is from: Home              Anticipated d/c is to: Home              Patient currently is not medically stable to d/c.   Difficult to place patient No  Consultants:  Nephrology Vascular Sx.  Procedures: Tunneled dialysis catheter, AV fistula  Antimicrobials:   Anti-infectives (From admission, onward)    Start     Dose/Rate Route Frequency Ordered Stop   01/05/21 0741  ceFAZolin (ANCEF) 2-4 GM/100ML-% IVPB       Note to Pharmacy: Mendel Corning   : cabinet override      01/05/21 0741 01/05/21 1944   01/01/21 2000  cefTRIAXone (ROCEPHIN) 2 g in sodium chloride 0.9 % 100 mL IVPB  Status:  Discontinued        2 g 200 mL/hr over 30 Minutes Intravenous Every 24 hours 12/31/20 2341 01/08/21 0946   12/31/20 2015  cefTRIAXone (ROCEPHIN) 1 g in sodium chloride 0.9 % 100 mL IVPB        1 g 200 mL/hr over 30 Minutes Intravenous  Once 12/31/20 2003 12/31/20 2105         Subjective: Patient was seen and examined at bedside.  No overnight events. Patient reports feeling better but has not have a bowel movement yet. process is underway for outpatient hemodialysis placement.    Objective: Vitals:   01/08/21 1824 01/08/21 2037 01/09/21 0448 01/09/21 0952  BP: (!) 143/82 136/76 132/70 122/72  Pulse: 86 81 84 79  Resp: 16 16 18 18   Temp: 97.6 F (36.4 C) 97.7 F (36.5 C) 97.7 F (36.5 C) 98.1 F (36.7 C)  TempSrc: Oral Oral Oral Oral  SpO2: 97% 98% 99% 95%  Weight:   (!) 137.9 kg   Height:        Intake/Output Summary (Last 24 hours) at 01/09/2021 1210 Last data filed at 01/09/2021 0920 Gross per 24 hour  Intake 1314 ml  Output 350 ml  Net 964 ml   Filed Weights   01/07/21 1250 01/07/21 1600 01/09/21 0448  Weight: (!) 137.5 kg 136 kg (!) 137.9 kg    Examination:  General exam: Appears comfortable, not in any acute distress. Respiratory system: Clear to auscultation bilaterally, no wheezes. Cardiovascular system: S1-S2 heard, regular rate and rhythm, no murmur Gastrointestinal system: Abdomen is soft, nontender, nondistended, normal bowel sounds heard.   Central nervous system: Alert and oriented x3, no focal neurological deficits. Extremities: Left AKA, right lower extremity 1+ pitting edema. Skin: No rash Psychiatry: Mood and affect appropriate   Data Reviewed: I have personally reviewed following labs and imaging studies  CBC: Recent Labs  Lab 01/04/21 0853 01/05/21 1311 01/06/21 0412 01/07/21 0530 01/08/21 0352 01/09/21 0447  WBC 4.0 7.1 5.8 4.6 5.5  --   HGB 8.5* 7.8* 7.2* 7.0* 7.4* 7.3*  HCT 27.9* 25.3* 22.8* 22.4* 24.8* 24.3*  MCV 84.5 84.3 82.9 84.2 86.1  --   PLT 205 199 183 181 216  --    Basic Metabolic Panel: Recent Labs  Lab 01/03/21 0511 01/04/21 0853 01/04/21 1314 01/06/21 2219 01/07/21 0530  NA 134* 133*  --  133* 133*  K 4.8 4.9  --  3.7 3.7  CL 105 105  --  98 100  CO2 17* 18*  --  25 25  GLUCOSE  117* 117*  --  136* 115*  BUN 77* 85*  --  41* 43*  CREATININE 7.91* 8.49*  --  5.12* 5.47*  CALCIUM 7.7* 7.7*  --  7.9* 8.2*  MG  --   --   --   --  2.0  PHOS  --   --  8.5* 4.6 5.5*   GFR: Estimated Creatinine Clearance: 19.7 mL/min (A) (by C-G formula based on SCr of 5.47 mg/dL (H)). Liver Function Tests: Recent Labs  Lab 01/06/21 2219  ALBUMIN 2.8*   No results for input(s): LIPASE, AMYLASE in the last 168 hours.  No results for input(s): AMMONIA in the last 168 hours. Coagulation Profile: No results for input(s): INR, PROTIME in the last 168 hours. Cardiac Enzymes: No results for input(s): CKTOTAL, CKMB, CKMBINDEX, TROPONINI in the last 168 hours.  BNP (last 3 results) No results for input(s): PROBNP in the last 8760 hours. HbA1C: No results for input(s): HGBA1C in the last 72 hours. CBG: Recent Labs  Lab 01/08/21 1202 01/08/21 1740 01/08/21 2131 01/09/21 0627 01/09/21 1144  GLUCAP 169* 143* 190* 120* 130*   Lipid Profile: No results for input(s): CHOL, HDL, LDLCALC, TRIG, CHOLHDL, LDLDIRECT in the last 72 hours. Thyroid Function Tests: No results for input(s): TSH, T4TOTAL, FREET4, T3FREE, THYROIDAB in the last 72 hours. Anemia Panel: No results for input(s): VITAMINB12, FOLATE, FERRITIN, TIBC, IRON, RETICCTPCT in the last 72 hours.  Sepsis Labs: No results for input(s): PROCALCITON, LATICACIDVEN in the last 168 hours.  Recent Results (from the past 240 hour(s))  Resp Panel by RT-PCR (Flu A&B, Covid) Nasopharyngeal Swab     Status: None   Collection Time: 12/31/20  6:24 PM   Specimen: Nasopharyngeal Swab; Nasopharyngeal(NP) swabs in vial transport medium  Result Value Ref Range Status   SARS Coronavirus 2 by RT PCR NEGATIVE NEGATIVE Final    Comment: (NOTE) SARS-CoV-2 target nucleic acids are NOT DETECTED.  The SARS-CoV-2 RNA is generally detectable in upper respiratory specimens during the acute phase of infection. The lowest concentration of  SARS-CoV-2 viral copies this assay can detect is 138 copies/mL. A negative result does not preclude SARS-Cov-2 infection and should not be used as the sole basis for treatment or other patient management decisions. A negative result may occur with  improper specimen collection/handling, submission of specimen other than nasopharyngeal swab, presence of viral mutation(s) within the areas targeted by this assay, and inadequate number of viral copies(<138 copies/mL). A negative result must be combined with clinical observations, patient history, and epidemiological information. The expected result is Negative.  Fact Sheet for Patients:  EntrepreneurPulse.com.au  Fact Sheet for Healthcare Providers:  IncredibleEmployment.be  This test is no t yet approved or cleared by the [place] FDA and  has been authorized for detection and/or diagnosis of SARS-CoV-2 by FDA under an Emergency Use Authorization (EUA). This EUA will remain  in effect (meaning this test can be used) for the duration of the COVID-19 declaration under Section 564(b)(1) of the Act, 21 U.S.C.section 360bbb-3(b)(1), unless the authorization is terminated  or revoked sooner.       Influenza A by PCR NEGATIVE NEGATIVE Final   Influenza B by PCR NEGATIVE NEGATIVE Final    Comment: (NOTE) The Xpert Xpress SARS-CoV-2/FLU/RSV plus assay is intended as an aid in the diagnosis of influenza from Nasopharyngeal swab specimens and should not be used as a sole basis for treatment. Nasal washings and aspirates are unacceptable for Xpert Xpress SARS-CoV-2/FLU/RSV testing.  Fact Sheet for Patients: EntrepreneurPulse.com.au  Fact Sheet for Healthcare Providers: IncredibleEmployment.be  This test is not yet approved or cleared by the [place] FDA and has been authorized for detection and/or diagnosis of SARS-CoV-2 by FDA under an Emergency Use  Authorization (EUA). This EUA will remain in effect (meaning this test can be used) for the duration of the COVID-19 declaration under Section 564(b)(1) of the Act, 21 U.S.C. section 360bbb-3(b)(1), unless the authorization is terminated or revoked.  Performed at [facility], [place] [address], [place], [place] [address]   Urine Culture     Status: Abnormal   Collection Time: 01/01/21 12:49 AM   Specimen: Urine, Clean Catch  Result Value Ref Range Status   Specimen Description   Final    URINE, CLEAN CATCH Performed at [facility], [address]  [place], [place] [address]    Special Requests   Final    NONE Performed at [facility], [place] [address], [place], [place] [address]    Culture MULTIPLE SPECIES PRESENT, SUGGEST RECOLLECTION (A)  Final   Report Status 01/02/2021 FINAL  Final         Radiology Studies: No results found.  Scheduled Meds:  amitriptyline  100 mg Oral QHS   amLODipine  10 mg Oral Daily   atorvastatin  20 mg Oral Daily   buPROPion  150 mg Oral Daily   calcium acetate  1,334 mg Oral TID WC   Chlorhexidine Gluconate Cloth  6 each Topical Q0600   [START ON 01/14/2021] darbepoetin (ARANESP) injection - DIALYSIS  100 mcg Intravenous Q Thu-HD   heparin  5,000 Units Subcutaneous Q8H   hydrocerin   Topical BID   insulin aspart  0-6 Units Subcutaneous TID WC   levothyroxine  88 mcg Oral Daily   metoprolol succinate  25 mg Oral Daily   senna  1 tablet Oral BID   Continuous Infusions:  sodium chloride     sodium chloride        LOS: 8 days    Time spent: 25 MINS    Aarav Burgett, MD [facility]   If 7PM-7AM, please contact night-coverage

## 2021-01-09 NOTE — Progress Notes (Signed)
[facility] NEPHROLOGY PROGRESS NOTE  Assessment/ Plan:  # End-stage renal disease: Following progression of chronic kidney disease stage V (proteinuric diabetic nephropathy). She has a right IJ TDC and left brachiocephalic placed by Dr. Scot Dock 8/16.  Process underway for outpatient dialysis unit placement (she does not want to be placed at [facility] where her late mother, Tyson Masin used to dialysis). Discussed with Renal navigator.  Plan for next HD today.   # Volume overload/HTH: Not responding with diuretics therefore switching to long-term hemodialysis.  Discontinue hydralazine as she is on dialysis with soft BP.  Continue amlodipine and beta-blocker.  # Hyponatremia: Secondary to volume excess and declining renal function/impaired free water excretion.  Monitor with hemodialysis.  #  Flank pain/pyuria/bladder wall thickening: Suspect to be consistent with cystitis/UTI-treated with ceftriaxone-urine culture polymicrobial.  # Anemia of chronic disease: Iron deficiency seen on labs-- begin IV Fe along with ESA. Monitor Hb.   # CKD-MBD: Currently on PhosLo.  Monitor calcium phosphorus level.  Subjective: Seen and examined at bedside.  No new event.  Still waiting for outpatient dialysis center arrangement.  Apparently she had her BP medication today therefore plan for dialysis later today. Objective Vital signs in last 24 hours: Vitals:   01/08/21 1824 01/08/21 2037 01/09/21 0448 01/09/21 0952  BP: (!) 143/82 136/76 132/70 122/72  Pulse: 86 81 84 79  Resp: 16 16 18 18   Temp: 97.6 F (36.4 C) 97.7 F (36.5 C) 97.7 F (36.5 C) 98.1 F (36.7 C)  TempSrc: Oral Oral Oral Oral  SpO2: 97% 98% 99% 95%  Weight:   (!) 137.9 kg   Height:       Weight change: 0.4 kg  Intake/Output Summary (Last 24 hours) at 01/09/2021 1120 Last data filed at 01/09/2021 0920 Gross per 24 hour  Intake 1314 ml  Output 350 ml  Net 964 ml        Labs: Basic  Metabolic Panel: Recent Labs  Lab 01/04/21 0853 01/04/21 1314 01/06/21 2219 01/07/21 0530  NA 133*  --  133* 133*  K 4.9  --  3.7 3.7  CL 105  --  98 100  CO2 18*  --  25 25  GLUCOSE 117*  --  136* 115*  BUN 85*  --  41* 43*  CREATININE 8.49*  --  5.12* 5.47*  CALCIUM 7.7*  --  7.9* 8.2*  PHOS  --  8.5* 4.6 5.5*    Liver Function Tests: Recent Labs  Lab 01/06/21 2219  ALBUMIN 2.8*    No results for input(s): LIPASE, AMYLASE in the last 168 hours. No results for input(s): AMMONIA in the last 168 hours. CBC: Recent Labs  Lab 01/04/21 0853 01/05/21 1311 01/06/21 0412 01/07/21 0530 01/08/21 0352 01/09/21 0447  WBC 4.0 7.1 5.8 4.6 5.5  --   HGB 8.5* 7.8* 7.2* 7.0* 7.4* 7.3*  HCT 27.9* 25.3* 22.8* 22.4* 24.8* 24.3*  MCV 84.5 84.3 82.9 84.2 86.1  --   PLT 205 199 183 181 216  --     Cardiac Enzymes: No results for input(s): CKTOTAL, CKMB, CKMBINDEX, TROPONINI in the last 168 hours. CBG: Recent Labs  Lab 01/08/21 0644 01/08/21 1202 01/08/21 1740 01/08/21 2131 01/09/21 0627  GLUCAP 113* 169* 143* 190* 120*     Iron Studies: No results for input(s): IRON, TIBC, TRANSFERRIN, FERRITIN in the last 72 hours. Studies/Results: No results found.  Medications: Infusions:  sodium chloride     sodium chloride  Scheduled Medications:  amitriptyline  100 mg Oral QHS   amLODipine  10 mg Oral Daily   atorvastatin  20 mg Oral Daily   buPROPion  150 mg Oral Daily   calcium acetate  1,334 mg Oral TID WC   Chlorhexidine Gluconate Cloth  6 each Topical Q0600   [START ON 01/14/2021] darbepoetin (ARANESP) injection - DIALYSIS  100 mcg Intravenous Q Thu-HD   heparin  5,000 Units Subcutaneous Q8H   hydrALAZINE  50 mg Oral TID   hydrocerin   Topical BID   insulin aspart  0-6 Units Subcutaneous TID WC   levothyroxine  88 mcg Oral Daily   metoprolol succinate  25 mg Oral Daily   senna  1 tablet Oral BID    have reviewed scheduled and prn medications.  Physical  Exam: General: Looks comfortable, not in distress Heart:RRR, s1s2 nl Lungs:clear b/l, no crackle Abdomen:soft, Non-tender,  Extremities: Trace LE edema Dialysis Access: Right IJ TDC in place, left upper extremity AV fistula wound healing well with good thrill.  Dutch Ing Prasad Karlena Luebke 01/09/2021,11:20 AM  LOS: 8 days

## 2021-01-10 DIAGNOSIS — N179 Acute kidney failure, unspecified: Secondary | ICD-10-CM | POA: Diagnosis not present

## 2021-01-10 LAB — GLUCOSE, CAPILLARY
Glucose-Capillary: 105 mg/dL — ABNORMAL HIGH (ref 70–99)
Glucose-Capillary: 137 mg/dL — ABNORMAL HIGH (ref 70–99)
Glucose-Capillary: 145 mg/dL — ABNORMAL HIGH (ref 70–99)
Glucose-Capillary: 184 mg/dL — ABNORMAL HIGH (ref 70–99)

## 2021-01-10 NOTE — Progress Notes (Signed)
PROGRESS NOTE    [name] [name]  PPJ:[id number] DOB: 31-Mar-1976 DOA: 12/31/2020 PCP: Riesa Pope, MD    Brief Narrative:  This 45 year old female with history of CKD stage IV, hypertension, diabetes mellitus type 2, tobacco abuse, anemia of chronic disease, chronic, left AKA, morbid obesity, chronic pain presented in the ED with left flank pain associated with nausea and vomiting.  On presentation, she was found to have worsening AKI with metabolic acidosis and possible UTI.  CT of the abdomen and pelvis showed features concerning for cystitis, gallstones with no cholecystitis.  She was started on IV fluids and antibiotics.  Nephrology was consulted who recommended transfer patient to [facility] for possible need of hemodialysis.  Patient is started on hemodialysis, tolerated well.  Plan is to set up outpatient hemodialysis before she can be discharged.  Assessment & Plan:   Principal Problem:   ARF (acute renal failure) (HCC) Active Problems:   Controlled type 2 diabetes mellitus (HCC)   OSA (obstructive sleep apnea)   Pyelonephritis   Hypertensive urgency   Acute renal failure (ARF) (HCC)  AKI on CKD stage IV : Progression of CKD stage IV now end-stage renal disease requiring hemodialysis Patient presented with worsening renal functions associated with nausea and vomiting. Nephrology consulted patient transferred to [facility] for dialysis. Creatinine 8.49 on 01/04/2021, 7.51 on 01/02/2021.   Hold nephrotoxic medications including lisinopril and Bumex Strict input and output.  Daily weights.  Fluid restriction.   She underwent tunneled dialysis catheter followed by hemodialysis on 8/16. Patient had right IJ tunneled dialysis catheter and left brachiocephalic fistula placed on 8/16. Patient has undergone 3 dialysis sessions so far,  Next dialysis on 8/20. Metabolic acidosis has resolved.  Process underway for outpatient dialysis unit placement Plan is to set up  outpatient hemodialysis before she can be discharged.   Hyperkalemia. Improved with HD.  Hypertensive urgency > Improved. Blood pressure currently stable.   Continue amlodipine, metoprolol,  Discontinue hydralazine as she is on HD. Bumex and lisinopril is on hold.   Suspect acute pyelonephritis Completed ceftriaxone for 7 days. Urine culture : Polymicrobial.   Hypothyroidism:  Continue Synthroid  Diabetes mellitus type 2 -A1c 6 in April 2022.   Continue CBGs with SSI   OSA not using CPAP Noncompliant with CPAP.  History of left AKA : Stable,  Morbid obesity Outpatient follow-up  Chronic pain syndrome Continue as needed opiates.  Outpatient follow-up with PCP/pain management   Anemia of chronic disease  -hemoglobin stable @ 7.4 .  No signs of bleeding.  Monitor H/H   DVT prophylaxis: Heparin Code Status: Full code. Family Communication: No family at bed side. Disposition Plan:    Status is: Inpatient  Remains inpatient appropriate because:Inpatient level of care appropriate due to severity of illness  Dispo: The patient is from: Home              Anticipated d/c is to: Home              Patient currently is not medically stable to d/c.   Difficult to place patient No  Consultants:  Nephrology Vascular Sx.  Procedures: Tunneled dialysis catheter, AV fistula  Antimicrobials:   Anti-infectives (From admission, onward)    Start     Dose/Rate Route Frequency Ordered Stop   01/05/21 0741  ceFAZolin (ANCEF) 2-4 GM/100ML-% IVPB       Note to Pharmacy: Mendel Corning   : cabinet override      01/05/21 0741 01/05/21  1944   01/01/21 2000  cefTRIAXone (ROCEPHIN) 2 g in sodium chloride 0.9 % 100 mL IVPB  Status:  Discontinued        2 g 200 mL/hr over 30 Minutes Intravenous Every 24 hours 12/31/20 2341 01/08/21 0946   12/31/20 2015  cefTRIAXone (ROCEPHIN) 1 g in sodium chloride 0.9 % 100 mL IVPB        1 g 200 mL/hr over 30 Minutes Intravenous  Once 12/31/20 2003  12/31/20 2105        Subjective: Patient was seen and examined at bedside.  No overnight events. Patient reports feeling much improved, given Senokot but has not had a bowel movement yet. process is underway for outpatient hemodialysis placement.    Objective: Vitals:   01/09/21 2259 01/09/21 2358 01/10/21 0549 01/10/21 1005  BP: 115/60 (!) 98/58 113/65 135/80  Pulse:  76 76 82  Resp: 13 18 (!) 22 18  Temp:  98.5 F (36.9 C) (!) 97.4 F (36.3 C) 98 F (36.7 C)  TempSrc:  Oral Oral Oral  SpO2:  100% 94%   Weight:      Height:        Intake/Output Summary (Last 24 hours) at 01/10/2021 1055 Last data filed at 01/10/2021 0828 Gross per 24 hour  Intake 440 ml  Output 4337 ml  Net -3897 ml   Filed Weights   01/07/21 1250 01/07/21 1600 01/09/21 0448  Weight: (!) 137.5 kg 136 kg (!) 137.9 kg    Examination:  General exam: Appears comfortable, not in any acute distress. Respiratory system: Clear to auscultation bilaterally, no wheezes. Cardiovascular system: S1-S2 heard, regular rate and rhythm, no murmur Gastrointestinal system: Abdomen is soft, nontender, nondistended, normal bowel sounds heard.   Central nervous system: Alert and oriented x3, no focal neurological deficits. Extremities: Left AKA, right lower extremity, no edema, no cyanosis, no clubbing. Skin: No rash Psychiatry: Mood and affect appropriate   Data Reviewed: I have personally reviewed following labs and imaging studies  CBC: Recent Labs  Lab 01/05/21 1311 01/06/21 0412 01/07/21 0530 01/08/21 0352 01/09/21 0447 01/09/21 1817  WBC 7.1 5.8 4.6 5.5  --  5.8  HGB 7.8* 7.2* 7.0* 7.4* 7.3* 7.5*  HCT 25.3* 22.8* 22.4* 24.8* 24.3* 24.6*  MCV 84.3 82.9 84.2 86.1  --  86.6  PLT 199 183 181 216  --  814   Basic Metabolic Panel: Recent Labs  Lab 01/04/21 0853 01/04/21 1314 01/06/21 2219 01/07/21 0530 01/09/21 1817  NA 133*  --  133* 133* 131*  K 4.9  --  3.7 3.7 3.9  CL 105  --  98 100 98  CO2  18*  --  25 25 24   GLUCOSE 117*  --  136* 115* 225*  BUN 85*  --  41* 43* 51*  CREATININE 8.49*  --  5.12* 5.47* 5.89*  CALCIUM 7.7*  --  7.9* 8.2* 8.6*  MG  --   --   --  2.0  --   PHOS  --  8.5* 4.6 5.5* 5.8*   GFR: Estimated Creatinine Clearance: 18.3 mL/min (A) (by C-G formula based on SCr of 5.89 mg/dL (H)). Liver Function Tests: Recent Labs  Lab 01/06/21 2219 01/09/21 1817  ALBUMIN 2.8* 2.8*   No results for input(s): LIPASE, AMYLASE in the last 168 hours.  No results for input(s): AMMONIA in the last 168 hours. Coagulation Profile: No results for input(s): INR, PROTIME in the last 168 hours. Cardiac Enzymes: No results for input(s):  CKTOTAL, CKMB, CKMBINDEX, TROPONINI in the last 168 hours.  BNP (last 3 results) No results for input(s): PROBNP in the last 8760 hours. HbA1C: No results for input(s): HGBA1C in the last 72 hours. CBG: Recent Labs  Lab 01/08/21 2131 01/09/21 0627 01/09/21 1144 01/09/21 1603 01/10/21 0554  GLUCAP 190* 120* 130* 158* 105*   Lipid Profile: No results for input(s): CHOL, HDL, LDLCALC, TRIG, CHOLHDL, LDLDIRECT in the last 72 hours. Thyroid Function Tests: No results for input(s): TSH, T4TOTAL, FREET4, T3FREE, THYROIDAB in the last 72 hours. Anemia Panel: No results for input(s): VITAMINB12, FOLATE, FERRITIN, TIBC, IRON, RETICCTPCT in the last 72 hours.  Sepsis Labs: No results for input(s): PROCALCITON, LATICACIDVEN in the last 168 hours.  Recent Results (from the past 240 hour(s))  Resp Panel by RT-PCR (Flu A&B, Covid) Nasopharyngeal Swab     Status: None   Collection Time: 12/31/20  6:24 PM   Specimen: Nasopharyngeal Swab; Nasopharyngeal(NP) swabs in vial transport medium  Result Value Ref Range Status   SARS Coronavirus 2 by RT PCR NEGATIVE NEGATIVE Final    Comment: (NOTE) SARS-CoV-2 target nucleic acids are NOT DETECTED.  The SARS-CoV-2 RNA is generally detectable in upper respiratory specimens during the acute phase of  infection. The lowest concentration of SARS-CoV-2 viral copies this assay can detect is 138 copies/mL. A negative result does not preclude SARS-Cov-2 infection and should not be used as the sole basis for treatment or other patient management decisions. A negative result may occur with  improper specimen collection/handling, submission of specimen other than nasopharyngeal swab, presence of viral mutation(s) within the areas targeted by this assay, and inadequate number of viral copies(<138 copies/mL). A negative result must be combined with clinical observations, patient history, and epidemiological information. The expected result is Negative.  Fact Sheet for Patients:  EntrepreneurPulse.com.au  Fact Sheet for Healthcare Providers:  IncredibleEmployment.be  This test is no t yet approved or cleared by the [place] FDA and  has been authorized for detection and/or diagnosis of SARS-CoV-2 by FDA under an Emergency Use Authorization (EUA). This EUA will remain  in effect (meaning this test can be used) for the duration of the COVID-19 declaration under Section 564(b)(1) of the Act, 21 U.S.C.section 360bbb-3(b)(1), unless the authorization is terminated  or revoked sooner.       Influenza A by PCR NEGATIVE NEGATIVE Final   Influenza B by PCR NEGATIVE NEGATIVE Final    Comment: (NOTE) The Xpert Xpress SARS-CoV-2/FLU/RSV plus assay is intended as an aid in the diagnosis of influenza from Nasopharyngeal swab specimens and should not be used as a sole basis for treatment. Nasal washings and aspirates are unacceptable for Xpert Xpress SARS-CoV-2/FLU/RSV testing.  Fact Sheet for Patients: EntrepreneurPulse.com.au  Fact Sheet for Healthcare Providers: IncredibleEmployment.be  This test is not yet approved or cleared by the [place] FDA and has been authorized for detection and/or diagnosis of SARS-CoV-2  by FDA under an Emergency Use Authorization (EUA). This EUA will remain in effect (meaning this test can be used) for the duration of the COVID-19 declaration under Section 564(b)(1) of the Act, 21 U.S.C. section 360bbb-3(b)(1), unless the authorization is terminated or revoked.  Performed at [facility], [place] [address], [place], [place] [address]   Urine Culture     Status: Abnormal   Collection Time: 01/01/21 12:49 AM   Specimen: Urine, Clean Catch  Result Value Ref Range Status   Specimen Description   Final    URINE, CLEAN CATCH  Performed at [facility], [place] [address], [place], [place] [address]    Special Requests   Final    NONE Performed at [facility], [place] [address] [place], [place] [address]    Culture MULTIPLE SPECIES PRESENT, SUGGEST RECOLLECTION (A)  Final   Report Status 01/02/2021 FINAL  Final    Radiology Studies: No results found.  Scheduled Meds:  amitriptyline  100 mg Oral QHS   amLODipine  10 mg Oral Daily   atorvastatin  20 mg Oral Daily   buPROPion  150 mg Oral Daily   calcium acetate  1,334 mg Oral TID WC   Chlorhexidine Gluconate Cloth  6 each Topical Q0600   [START ON 01/14/2021] darbepoetin (ARANESP) injection - DIALYSIS  100 mcg Intravenous Q Thu-HD   heparin  5,000 Units Subcutaneous Q8H   hydrocerin   Topical BID   insulin aspart  0-6 Units Subcutaneous TID WC   levothyroxine  88 mcg Oral Daily   metoprolol succinate  25 mg Oral Daily   senna  1 tablet Oral BID   Continuous Infusions:   LOS: 9 days   Time spent: 25 MINS  Zendayah Hardgrave, MD [facility]   If 7PM-7AM, please contact night-coverage

## 2021-01-10 NOTE — Progress Notes (Signed)
[facility] NEPHROLOGY PROGRESS NOTE  Assessment/ Plan:  # End-stage renal disease: Following progression of chronic kidney disease stage V (proteinuric diabetic nephropathy). She has a right IJ TDC and left brachiocephalic placed by Dr. Scot Dock 8/16.  Process underway for outpatient dialysis unit placement (she does not want to be placed at [facility] where her late mother, Marna Weniger used to dialysis). Discussed with Renal navigator.  Status post dialysis yesterday with 3 L ultrafiltration.  Tolerated well.  Plan for next HD on 8/23.  # Volume overload/HTH: She did not respond with diuretics therefore switched to long-term hemodialysis.  Discontinue hydralazine as she is on dialysis with soft BP.  Continue amlodipine and beta-blocker.  # Hyponatremia: Secondary to volume excess and declining renal function/impaired free water excretion.  Monitor with hemodialysis.  #  Flank pain/pyuria/bladder wall thickening: Suspect to be consistent with cystitis/UTI-treated with ceftriaxone-urine culture polymicrobial.  # Anemia of chronic disease: Iron deficiency seen on labs-- begin IV Fe along with ESA. Monitor Hb.   # CKD-MBD: Currently on PhosLo.  Monitor calcium phosphorus level.  Subjective: Seen and examined.  Tolerated dialysis well yesterday.  Denies nausea, vomiting, chest pain, shortness of breath.  No new event. Objective Vital signs in last 24 hours: Vitals:   01/09/21 2259 01/09/21 2358 01/10/21 0549 01/10/21 1005  BP: 115/60 (!) 98/58 113/65 135/80  Pulse:  76 76 82  Resp: 13 18 (!) 22 18  Temp:  98.5 F (36.9 C) (!) 97.4 F (36.3 C) 98 F (36.7 C)  TempSrc:  Oral Oral Oral  SpO2:  100% 94%   Weight:      Height:       Weight change:   Intake/Output Summary (Last 24 hours) at 01/10/2021 1308 Last data filed at 01/10/2021 1212 Gross per 24 hour  Intake 240 ml  Output 4437 ml  Net -4197 ml        Labs: Basic Metabolic Panel: Recent  Labs  Lab 01/06/21 2219 01/07/21 0530 01/09/21 1817  NA 133* 133* 131*  K 3.7 3.7 3.9  CL 98 100 98  CO2 25 25 24   GLUCOSE 136* 115* 225*  BUN 41* 43* 51*  CREATININE 5.12* 5.47* 5.89*  CALCIUM 7.9* 8.2* 8.6*  PHOS 4.6 5.5* 5.8*    Liver Function Tests: Recent Labs  Lab 01/06/21 2219 01/09/21 1817  ALBUMIN 2.8* 2.8*    No results for input(s): LIPASE, AMYLASE in the last 168 hours. No results for input(s): AMMONIA in the last 168 hours. CBC: Recent Labs  Lab 01/05/21 1311 01/06/21 0412 01/07/21 0530 01/08/21 0352 01/09/21 0447 01/09/21 1817  WBC 7.1 5.8 4.6 5.5  --  5.8  HGB 7.8* 7.2* 7.0* 7.4* 7.3* 7.5*  HCT 25.3* 22.8* 22.4* 24.8* 24.3* 24.6*  MCV 84.3 82.9 84.2 86.1  --  86.6  PLT 199 183 181 216  --  215    Cardiac Enzymes: No results for input(s): CKTOTAL, CKMB, CKMBINDEX, TROPONINI in the last 168 hours. CBG: Recent Labs  Lab 01/09/21 0627 01/09/21 1144 01/09/21 1603 01/10/21 0554 01/10/21 1116  GLUCAP 120* 130* 158* 105* 137*     Iron Studies: No results for input(s): IRON, TIBC, TRANSFERRIN, FERRITIN in the last 72 hours. Studies/Results: No results found.  Medications: Infusions:    Scheduled Medications:  amitriptyline  100 mg Oral QHS   amLODipine  10 mg Oral Daily   atorvastatin  20 mg Oral Daily   buPROPion  150 mg Oral Daily  calcium acetate  1,334 mg Oral TID WC   Chlorhexidine Gluconate Cloth  6 each Topical Q0600   [START ON 01/14/2021] darbepoetin (ARANESP) injection - DIALYSIS  100 mcg Intravenous Q Thu-HD   heparin  5,000 Units Subcutaneous Q8H   hydrocerin   Topical BID   insulin aspart  0-6 Units Subcutaneous TID WC   levothyroxine  88 mcg Oral Daily   metoprolol succinate  25 mg Oral Daily   senna  1 tablet Oral BID    have reviewed scheduled and prn medications.  Physical Exam: General: Sitting comfortable, not in distress Heart:RRR, s1s2 nl Lungs:clear b/l, no crackle Abdomen:soft, Non-tender,   Extremities: Trace  edema Dialysis Access: Right IJ TDC in place, left upper extremity AV fistula wound healing well with good thrill.  Chaeli Judy Tanna Furry 01/10/2021,1:08 PM  LOS: 9 days

## 2021-01-11 DIAGNOSIS — N179 Acute kidney failure, unspecified: Secondary | ICD-10-CM | POA: Diagnosis not present

## 2021-01-11 LAB — GLUCOSE, CAPILLARY
Glucose-Capillary: 124 mg/dL — ABNORMAL HIGH (ref 70–99)
Glucose-Capillary: 130 mg/dL — ABNORMAL HIGH (ref 70–99)
Glucose-Capillary: 136 mg/dL — ABNORMAL HIGH (ref 70–99)
Glucose-Capillary: 206 mg/dL — ABNORMAL HIGH (ref 70–99)

## 2021-01-11 MED ORDER — SODIUM CHLORIDE 0.9 % IV SOLN
125.0000 mg | INTRAVENOUS | Status: DC
  Filled 2021-01-11: qty 10

## 2021-01-11 NOTE — Progress Notes (Addendum)
PROGRESS NOTE    PENNE ROSENSTOCK  MOQ:[id number] DOB: Jun 11, 1975 DOA: 12/31/2020 PCP: Riesa Pope, MD    Brief Narrative:  This 45 year old female with history of CKD stage IV, hypertension, diabetes mellitus type 2, tobacco abuse, anemia of chronic disease, chronic, left AKA, morbid obesity, chronic pain presented in the ED with left flank pain associated with nausea and vomiting.  On presentation, she was found to have worsening AKI with metabolic acidosis and possible UTI.  CT of the abdomen and pelvis showed features concerning for cystitis, gallstones with no cholecystitis.  She was started on IV fluids and antibiotics.  Nephrology was consulted who recommended transfer patient to [facility] for possible need of hemodialysis.  Patient is started on hemodialysis, tolerated well.  Plan is to set up outpatient hemodialysis before she can be discharged.  Assessment & Plan:   Principal Problem:   ARF (acute renal failure) (HCC) Active Problems:   Controlled type 2 diabetes mellitus (HCC)   OSA (obstructive sleep apnea)   Pyelonephritis   Hypertensive urgency   Acute renal failure (ARF) (HCC)  AKI on CKD stage IV : Progression of CKD stage IV now end-stage renal disease requiring hemodialysis Patient presented with worsening renal functions associated with nausea and vomiting. Nephrology consulted patient transferred to [facility] for dialysis. Creatinine 8.49 on 01/04/2021, 7.51 on 01/02/2021.   Hold nephrotoxic medications including lisinopril and Bumex Strict input and output.  Daily weights.  Fluid restriction.   She underwent tunneled dialysis catheter followed by hemodialysis on 8/16. Patient had right IJ tunneled dialysis catheter and left brachiocephalic fistula placed on 8/16. Patient has undergone 3 dialysis sessions so far,  Next dialysis on 8/23. Metabolic acidosis has resolved.  Process underway for outpatient dialysis unit placement Plan is to set up  outpatient hemodialysis before she can be discharged.   Hyperkalemia. Improved with HD.  Hypertensive urgency > Improved. Blood pressure currently stable.   Continue amlodipine, metoprolol,  Discontinue hydralazine as she is on HD. Bumex and lisinopril is on hold.   Suspect acute pyelonephritis Completed ceftriaxone for 7 days. Urine culture : Polymicrobial.   Hypothyroidism:  Continue Synthroid  Diabetes mellitus type 2 -A1c 6 in April 2022.   Continue CBGs with SSI   OSA not using CPAP Noncompliant with CPAP.  History of left AKA : Stable,  Morbid obesity Outpatient follow-up  Chronic pain syndrome Continue as needed opiates.  Outpatient follow-up with PCP/pain management   Anemia of chronic disease  -hemoglobin stable @ 7.4 .  No signs of bleeding.  Monitor H/H   DVT prophylaxis: Heparin Code Status: Full code. Family Communication: No family at bed side. Disposition Plan:    Status is: Inpatient  Remains inpatient appropriate because:Inpatient level of care appropriate due to severity of illness  Dispo: The patient is from: Home              Anticipated d/c is to: Home              Patient currently is not medically stable to d/c.   Difficult to place patient No  Consultants:  Nephrology Vascular Sx.  Procedures: Tunneled dialysis catheter, AV fistula  Antimicrobials:   Anti-infectives (From admission, onward)    Start     Dose/Rate Route Frequency Ordered Stop   01/05/21 0741  ceFAZolin (ANCEF) 2-4 GM/100ML-% IVPB       Note to Pharmacy: Mendel Corning   : cabinet override      01/05/21 0741 01/05/21  1944   01/01/21 2000  cefTRIAXone (ROCEPHIN) 2 g in sodium chloride 0.9 % 100 mL IVPB  Status:  Discontinued        2 g 200 mL/hr over 30 Minutes Intravenous Every 24 hours 12/31/20 2341 01/08/21 0946   12/31/20 2015  cefTRIAXone (ROCEPHIN) 1 g in sodium chloride 0.9 % 100 mL IVPB        1 g 200 mL/hr over 30 Minutes Intravenous  Once 12/31/20 2003  12/31/20 2105        Subjective: Patient was seen and examined at bedside.  No overnight events. She reports feeling improved.  She had a bowel movement with Senokot.   Process is underway for outpatient hemodialysis placement.    Objective: Vitals:   01/10/21 1629 01/10/21 2037 01/11/21 0520 01/11/21 0911  BP: 133/73 126/81 113/64 (!) 178/88  Pulse: 80 77 76 86  Resp: 18 16 16 18   Temp: 98.1 F (36.7 C) 98 F (36.7 C) 98 F (36.7 C) 98.2 F (36.8 C)  TempSrc: Oral Oral Oral   SpO2: 98% 96% 94% 97%  Weight:      Height:        Intake/Output Summary (Last 24 hours) at 01/11/2021 1149 Last data filed at 01/11/2021 0800 Gross per 24 hour  Intake 780 ml  Output 1225 ml  Net -445 ml   Filed Weights   01/07/21 1250 01/07/21 1600 01/09/21 0448  Weight: (!) 137.5 kg 136 kg (!) 137.9 kg    Examination:  General exam: Appears comfortable, not in any acute distress.   Respiratory system: Clear to auscultation bilaterally, no wheezes. Cardiovascular system: S1-S2 heard, regular rate and rhythm, no murmur Gastrointestinal system: Abdomen is soft, nontender, nondistended, normal bowel sounds +. Central nervous system: Alert and oriented x 3, no focal neurological deficits. Extremities: Left AKA, right lower extremity, no edema, no cyanosis, no clubbing. AV fistula left arm Skin: No rash Psychiatry: Mood and affect appropriate   Data Reviewed: I have personally reviewed following labs and imaging studies  CBC: Recent Labs  Lab 01/05/21 1311 01/06/21 0412 01/07/21 0530 01/08/21 0352 01/09/21 0447 01/09/21 1817  WBC 7.1 5.8 4.6 5.5  --  5.8  HGB 7.8* 7.2* 7.0* 7.4* 7.3* 7.5*  HCT 25.3* 22.8* 22.4* 24.8* 24.3* 24.6*  MCV 84.3 82.9 84.2 86.1  --  86.6  PLT 199 183 181 216  --  297   Basic Metabolic Panel: Recent Labs  Lab 01/04/21 1314 01/06/21 2219 01/07/21 0530 01/09/21 1817  NA  --  133* 133* 131*  K  --  3.7 3.7 3.9  CL  --  98 100 98  CO2  --  25 25 24    GLUCOSE  --  136* 115* 225*  BUN  --  41* 43* 51*  CREATININE  --  5.12* 5.47* 5.89*  CALCIUM  --  7.9* 8.2* 8.6*  MG  --   --  2.0  --   PHOS 8.5* 4.6 5.5* 5.8*   GFR: Estimated Creatinine Clearance: 18.3 mL/min (A) (by C-G formula based on SCr of 5.89 mg/dL (H)). Liver Function Tests: Recent Labs  Lab 01/06/21 2219 01/09/21 1817  ALBUMIN 2.8* 2.8*   No results for input(s): LIPASE, AMYLASE in the last 168 hours.  No results for input(s): AMMONIA in the last 168 hours. Coagulation Profile: No results for input(s): INR, PROTIME in the last 168 hours. Cardiac Enzymes: No results for input(s): CKTOTAL, CKMB, CKMBINDEX, TROPONINI in the last 168 hours.  BNP (  last 3 results) No results for input(s): PROBNP in the last 8760 hours. HbA1C: No results for input(s): HGBA1C in the last 72 hours. CBG: Recent Labs  Lab 01/10/21 0554 01/10/21 1116 01/10/21 1627 01/10/21 2043 01/11/21 0648  GLUCAP 105* 137* 145* 184* 124*   Lipid Profile: No results for input(s): CHOL, HDL, LDLCALC, TRIG, CHOLHDL, LDLDIRECT in the last 72 hours. Thyroid Function Tests: No results for input(s): TSH, T4TOTAL, FREET4, T3FREE, THYROIDAB in the last 72 hours. Anemia Panel: No results for input(s): VITAMINB12, FOLATE, FERRITIN, TIBC, IRON, RETICCTPCT in the last 72 hours.  Sepsis Labs: No results for input(s): PROCALCITON, LATICACIDVEN in the last 168 hours.  No results found for this or any previous visit (from the past 240 hour(s)).   Radiology Studies: No results found.  Scheduled Meds:  amitriptyline  100 mg Oral QHS   amLODipine  10 mg Oral Daily   atorvastatin  20 mg Oral Daily   buPROPion  150 mg Oral Daily   calcium acetate  1,334 mg Oral TID WC   Chlorhexidine Gluconate Cloth  6 each Topical Q0600   [START ON 01/14/2021] darbepoetin (ARANESP) injection - DIALYSIS  100 mcg Intravenous Q Thu-HD   heparin  5,000 Units Subcutaneous Q8H   hydrocerin   Topical BID   insulin aspart  0-6  Units Subcutaneous TID WC   levothyroxine  88 mcg Oral Daily   metoprolol succinate  25 mg Oral Daily   senna  1 tablet Oral BID   Continuous Infusions:   LOS: 10 days   Time spent: 25 MINS  Niamya Vittitow, MD [facility]   If 7PM-7AM, please contact night-coverage

## 2021-01-11 NOTE — Progress Notes (Signed)
[facility] NEPHROLOGY PROGRESS NOTE  Assessment/ Plan:  # New ESRD: Following progression of chronic kidney disease stage V (proteinuric diabetic nephropathy). She has a right IJ TDC and left brachiocephalic placed by Dr. Scot Dock 8/16.  Process underway for outpatient dialysis unit placement (she does not want to be placed at [facility] where her late mother, Jakalyn Kratky used to dialysis).  On THS schedule:  3K, 2-3L UF, TDC, No heparin  # Volume overload/HTH: She did not respond with diuretics therefore switched to long-term hemodialysis.  Discontinue hydralazine as she is on dialysis with soft BP.  Continue amlodipine and beta-blocker.  # Hyponatremia: Secondary to volume excess and declining renal function/impaired free water excretion.  Monitor with hemodialysis.  #  Flank pain/pyuria/bladder wall thickening: Suspect to be consistent with cystitis/UTI-treated with ceftriaxone-urine culture polymicrobial.  # Anemia of chronic disease: Iron deficiency seen on labs-- cont IV Fe along with ESA. Monitor Hb.   # CKD-MBD: Currently on PhosLo.  Monitor calcium phosphorus level.  Subjective:  NO interval events Awaiting CLIP  Objective Vital signs in last 24 hours: Vitals:   01/10/21 1629 01/10/21 2037 01/11/21 0520 01/11/21 0911  BP: 133/73 126/81 113/64 (!) 178/88  Pulse: 80 77 76 86  Resp: 18 16 16 18   Temp: 98.1 F (36.7 C) 98 F (36.7 C) 98 F (36.7 C) 98.2 F (36.8 C)  TempSrc: Oral Oral Oral   SpO2: 98% 96% 94% 97%  Weight:      Height:       Weight change:   Intake/Output Summary (Last 24 hours) at 01/11/2021 1511 Last data filed at 01/11/2021 1300 Gross per 24 hour  Intake 960 ml  Output 1025 ml  Net -65 ml        Labs: Basic Metabolic Panel: Recent Labs  Lab 01/06/21 2219 01/07/21 0530 01/09/21 1817  NA 133* 133* 131*  K 3.7 3.7 3.9  CL 98 100 98  CO2 25 25 24   GLUCOSE 136* 115* 225*  BUN 41* 43* 51*  CREATININE  5.12* 5.47* 5.89*  CALCIUM 7.9* 8.2* 8.6*  PHOS 4.6 5.5* 5.8*    Liver Function Tests: Recent Labs  Lab 01/06/21 2219 01/09/21 1817  ALBUMIN 2.8* 2.8*    No results for input(s): LIPASE, AMYLASE in the last 168 hours. No results for input(s): AMMONIA in the last 168 hours. CBC: Recent Labs  Lab 01/05/21 1311 01/06/21 0412 01/07/21 0530 01/08/21 0352 01/09/21 0447 01/09/21 1817  WBC 7.1 5.8 4.6 5.5  --  5.8  HGB 7.8* 7.2* 7.0* 7.4* 7.3* 7.5*  HCT 25.3* 22.8* 22.4* 24.8* 24.3* 24.6*  MCV 84.3 82.9 84.2 86.1  --  86.6  PLT 199 183 181 216  --  215    Cardiac Enzymes: No results for input(s): CKTOTAL, CKMB, CKMBINDEX, TROPONINI in the last 168 hours. CBG: Recent Labs  Lab 01/10/21 1116 01/10/21 1627 01/10/21 2043 01/11/21 0648 01/11/21 1159  GLUCAP 137* 145* 184* 124* 130*     Iron Studies: No results for input(s): IRON, TIBC, TRANSFERRIN, FERRITIN in the last 72 hours. Studies/Results: No results found.  Medications: Infusions:    Scheduled Medications:  amitriptyline  100 mg Oral QHS   amLODipine  10 mg Oral Daily   atorvastatin  20 mg Oral Daily   buPROPion  150 mg Oral Daily   calcium acetate  1,334 mg Oral TID WC   Chlorhexidine Gluconate Cloth  6 each Topical Q0600   [START ON 01/14/2021] darbepoetin (ARANESP) injection -  DIALYSIS  100 mcg Intravenous Q Thu-HD   heparin  5,000 Units Subcutaneous Q8H   hydrocerin   Topical BID   insulin aspart  0-6 Units Subcutaneous TID WC   levothyroxine  88 mcg Oral Daily   metoprolol succinate  25 mg Oral Daily   senna  1 tablet Oral BID    have reviewed scheduled and prn medications.  Physical Exam: General: Sitting comfortable, not in distress Heart:RRR, s1s2 nl Lungs:clear b/l, no crackle Abdomen:soft, Non-tender,  Extremities: Trace  edema Dialysis Access: Right IJ TDC in place, left upper extremity AV fistula wound healing well with good thrill.  Joseph Johns B Kayleen Alig 01/11/2021,3:11 PM  LOS: 10 days

## 2021-01-12 ENCOUNTER — Other Ambulatory Visit: Payer: Self-pay | Admitting: Student

## 2021-01-12 DIAGNOSIS — N179 Acute kidney failure, unspecified: Secondary | ICD-10-CM | POA: Diagnosis not present

## 2021-01-12 LAB — RENAL FUNCTION PANEL
Albumin: 2.7 g/dL — ABNORMAL LOW (ref 3.5–5.0)
Anion gap: 9 (ref 5–15)
BUN: 59 mg/dL — ABNORMAL HIGH (ref 6–20)
CO2: 22 mmol/L (ref 22–32)
Calcium: 8.6 mg/dL — ABNORMAL LOW (ref 8.9–10.3)
Chloride: 100 mmol/L (ref 98–111)
Creatinine, Ser: 6.84 mg/dL — ABNORMAL HIGH (ref 0.44–1.00)
GFR, Estimated: 7 mL/min — ABNORMAL LOW (ref >60)
Glucose, Bld: 147 mg/dL — ABNORMAL HIGH (ref 70–99)
Phosphorus: 5.8 mg/dL — ABNORMAL HIGH (ref 2.5–4.6)
Potassium: 4.4 mmol/L (ref 3.5–5.1)
Sodium: 131 mmol/L — ABNORMAL LOW (ref 135–145)

## 2021-01-12 LAB — PREPARE RBC (CROSSMATCH)

## 2021-01-12 LAB — CBC
HCT: 22.6 % — ABNORMAL LOW (ref 36.0–46.0)
Hemoglobin: 7 g/dL — ABNORMAL LOW (ref 12.0–15.0)
MCH: 26.5 pg (ref 26.0–34.0)
MCHC: 31 g/dL (ref 30.0–36.0)
MCV: 85.6 fL (ref 80.0–100.0)
Platelets: 176 10*3/uL (ref 150–400)
RBC: 2.64 MIL/uL — ABNORMAL LOW (ref 3.87–5.11)
RDW: 14.9 % (ref 11.5–15.5)
WBC: 5.6 10*3/uL (ref 4.0–10.5)
nRBC: 0 % (ref 0.0–0.2)

## 2021-01-12 LAB — HEMOGLOBIN AND HEMATOCRIT, BLOOD
HCT: 21.9 % — ABNORMAL LOW (ref 36.0–46.0)
Hemoglobin: 6.8 g/dL — CL (ref 12.0–15.0)

## 2021-01-12 LAB — MAGNESIUM: Magnesium: 2.2 mg/dL (ref 1.7–2.4)

## 2021-01-12 LAB — PHOSPHORUS: Phosphorus: 5.7 mg/dL — ABNORMAL HIGH (ref 2.5–4.6)

## 2021-01-12 LAB — GLUCOSE, CAPILLARY: Glucose-Capillary: 93 mg/dL (ref 70–99)

## 2021-01-12 MED ORDER — HEPARIN SODIUM (PORCINE) 1000 UNIT/ML IJ SOLN
INTRAMUSCULAR | Status: AC
  Filled 2021-01-12: qty 1

## 2021-01-12 MED ORDER — CALCIUM ACETATE (PHOS BINDER) 667 MG PO CAPS: 667.0000 mg | ORAL_CAPSULE | Freq: Three times a day (TID) | ORAL | 1 refills | Status: DC

## 2021-01-12 MED ORDER — SODIUM CHLORIDE 0.9% IV SOLUTION: Freq: Once | INTRAVENOUS | Status: DC

## 2021-01-12 NOTE — Plan of Care (Signed)
  Problem: Education: Goal: Knowledge of General Education information will improve Description: Including pain rating scale, medication(s)/side effects and non-pharmacologic comfort measures Outcome: Completed/Met   Problem: Clinical Measurements: Goal: Ability to maintain clinical measurements within normal limits will improve Outcome: Completed/Met Goal: Will remain free from infection Outcome: Completed/Met   Problem: Elimination: Goal: Will not experience complications related to bowel motility Outcome: Completed/Met Goal: Will not experience complications related to urinary retention Outcome: Completed/Met   Problem: Pain Managment: Goal: General experience of comfort will improve Outcome: Completed/Met   Problem: Safety: Goal: Ability to remain free from injury will improve Outcome: Completed/Met   Problem: Skin Integrity: Goal: Risk for impaired skin integrity will decrease Outcome: Completed/Met   Problem: Fluid Volume: Goal: Compliance with measures to maintain balanced fluid volume will improve Outcome: Completed/Met   Problem: Health Behavior/Discharge Planning: Goal: Ability to manage health-related needs will improve Outcome: Completed/Met   Problem: Clinical Measurements: Goal: Complications related to the disease process, condition or treatment will be avoided or minimized Outcome: Completed/Met   Problem: Education: Goal: Ability to describe self-care measures that may prevent or decrease complications (Diabetes Survival Skills Education) will improve Outcome: Completed/Met Goal: Individualized Educational Video(s) Outcome: Completed/Met   Problem: Coping: Goal: Ability to adjust to condition or change in health will improve Outcome: Completed/Met   Problem: Fluid Volume: Goal: Ability to maintain a balanced intake and output will improve Outcome: Completed/Met   Problem: Health Behavior/Discharge Planning: Goal: Ability to identify and utilize  available resources and services will improve Outcome: Completed/Met Goal: Ability to manage health-related needs will improve Outcome: Completed/Met   Problem: Metabolic: Goal: Ability to maintain appropriate glucose levels will improve Outcome: Completed/Met   Problem: Nutritional: Goal: Maintenance of adequate nutrition will improve Outcome: Completed/Met Goal: Progress toward achieving an optimal weight will improve Outcome: Completed/Met   Problem: Skin Integrity: Goal: Risk for impaired skin integrity will decrease Outcome: Completed/Met   Problem: Tissue Perfusion: Goal: Adequacy of tissue perfusion will improve Outcome: Completed/Met

## 2021-01-12 NOTE — Progress Notes (Signed)
DISCHARGE NOTE HOME [name] [name] to be discharged Home per MD order. Discussed prescriptions and follow up appointments with the patient. Prescriptions given to patient; medication list explained in detail. Patient verbalized understanding.  Skin clean, dry and intact without evidence of skin break down, no evidence of skin tears noted. IV catheter discontinued intact. Site without signs and symptoms of complications. Dressing and pressure applied. Pt denies pain at the site currently. No complaints noted.  Patient free of lines, drains, and wounds.   An After Visit Summary (AVS) was printed and given to the patient. Patient escorted via wheelchair, and discharged home via private auto.  Vira Agar, RN

## 2021-01-12 NOTE — TOC Transition Note (Signed)
Transition of Care [facility]) - CM/SW Discharge Note   Patient Details  Name: [name] [name] MRN: [id number] Date of Birth: 1975-07-11  [facility] of [facility]) CM/SW Contact:  Tom-Johnson, Renea Ee, RN Phone Number: 01/12/2021, 1:04 PM   Clinical Narrative:    Spoke with patient about going home at discharge. Patient is eager to go home and willing to do outpatient dialysis which is arranged at [facility] and on their TTS shift at 10:30 am. Patient states she has arranged with Medicaid transportation and she is setup through January 2023. Patient has DME's at home. Able to get medication from Pharmacy through [facility]. Family member will pick her up at discharge. No further TOC needs.   Final next level of care: Home/Self Care (New dialysis patient. Will be doing outpatient dialysis.) Barriers to Discharge: No Barriers Identified   Patient Goals and CMS Choice Patient states their goals for this hospitalization and ongoing recovery are:: To go home   Choice offered to / list presented to : NA  Discharge Placement                       Discharge Plan and Services                DME Arranged: N/A DME Agency: NA       HH Arranged: NA HH Agency: NA        Social Determinants of Health (SDOH) Interventions     Readmission Risk Interventions Readmission Risk Prevention Plan 10/31/2019 10/14/2019  Transportation Screening Complete Complete  PCP or Specialist Appt within 3-5 Days - Not Complete  Not Complete comments - plan for rehab  [place] or [place] - Complete  Social Work Consult for [place] Planning/Counseling - Complete  Palliative Care Screening - Not Applicable  Medication Review [employer] photographer) Complete Referral to Pharmacy  PCP or Specialist appointment within 3-5 days of discharge Complete -  [place] or Home Care Consult Complete -  SW Recovery Care/Counseling Consult Complete -  Palliative Care Screening Not  Applicable -  [place] Not Applicable -  Some recent data might be hidden

## 2021-01-12 NOTE — Progress Notes (Signed)
[facility] NEPHROLOGY PROGRESS NOTE  Assessment/ Plan:  # New ESRD: 2/2 DKD.  R IJ TDC and left brachiocephalic AVF placed by Dr. Scot Dock 8/16.  CLIP [facility] [place] 10:397m On THS schedule:  3K, 2-3L UF, TDC, No heparin  # Volume overload/HTN: Stable on CCB/BB, Progressively challenge post weights  # Hyponatremia: Mild, limit total daily fliuds  #  Flank pain/pyuria/bladder wall thickening: Suspect to be consistent with cystitis/UTI-treated with ceftriaxone-urine culture polymicrobial.  # Anemia of chronic disease: Hb in 7s, low. Cont ESA/Fe, Transfuse prn  # CKD-MBD: Cont PHosLo  Subjective:  Seen on HD: 3K, TDC, UF goal 2.5L.  Tolerating well Has outpt spot THS To rec 1u pRBC today K 4.4, Hb 7.0, P 5.8  Objective Vital signs in last 24 hours: Vitals:   01/12/21 0659 01/12/21 0730 01/12/21 0800 01/12/21 0816  BP: 131/77 131/84 132/76 131/75  Pulse:   81 80  Resp: 16 15  16   Temp:    98.1 F (36.7 C)  TempSrc:      SpO2:    99%  Weight:      Height:       Weight change:   Intake/Output Summary (Last 24 hours) at 01/12/2021 0845 Last data filed at 01/12/2021 0600 Gross per 24 hour  Intake 720 ml  Output 700 ml  Net 20 ml        Labs: Basic Metabolic Panel: Recent Labs  Lab 01/07/21 0530 01/09/21 1817 01/12/21 0356 01/12/21 0618  NA 133* 131*  --  131*  K 3.7 3.9  --  4.4  CL 100 98  --  100  CO2 25 24  --  22  GLUCOSE 115* 225*  --  147*  BUN 43* 51*  --  59*  CREATININE 5.47* 5.89*  --  6.84*  CALCIUM 8.2* 8.6*  --  8.6*  PHOS 5.5* 5.8* 5.7* 5.8*    Liver Function Tests: Recent Labs  Lab 01/06/21 2219 01/09/21 1817 01/12/21 0618  ALBUMIN 2.8* 2.8* 2.7*    No results for input(s): LIPASE, AMYLASE in the last 168 hours. No results for input(s): AMMONIA in the last 168 hours. CBC: Recent Labs  Lab 01/06/21 0412 01/07/21 0530 01/08/21 0352 01/09/21 0447 01/09/21 1817 01/12/21 0356 01/12/21 0618  WBC 5.8 4.6 5.5  --  5.8   --  5.6  HGB 7.2* 7.0* 7.4*   < > 7.5* 6.8* 7.0*  HCT 22.8* 22.4* 24.8*   < > 24.6* 21.9* 22.6*  MCV 82.9 84.2 86.1  --  86.6  --  85.6  PLT 183 181 216  --  215  --  176   < > = values in this interval not displayed.    Cardiac Enzymes: No results for input(s): CKTOTAL, CKMB, CKMBINDEX, TROPONINI in the last 168 hours. CBG: Recent Labs  Lab 01/10/21 2043 01/11/21 0648 01/11/21 1159 01/11/21 1649 01/11/21 2242  GLUCAP 184* 124* 130* 136* 206*     Iron Studies: No results for input(s): IRON, TIBC, TRANSFERRIN, FERRITIN in the last 72 hours. Studies/Results: No results found.  Medications: Infusions:  ferric gluconate (FERRLECIT) IVPB       Scheduled Medications:  sodium chloride   Intravenous Once   amitriptyline  100 mg Oral QHS   amLODipine  10 mg Oral Daily   atorvastatin  20 mg Oral Daily   buPROPion  150 mg Oral Daily   calcium acetate  1,334 mg Oral TID WC   Chlorhexidine Gluconate Cloth  6  each Topical Q0600   [START ON 01/14/2021] darbepoetin (ARANESP) injection - DIALYSIS  100 mcg Intravenous Q Thu-HD   heparin  5,000 Units Subcutaneous Q8H   hydrocerin   Topical BID   insulin aspart  0-6 Units Subcutaneous TID WC   levothyroxine  88 mcg Oral Daily   metoprolol succinate  25 mg Oral Daily   senna  1 tablet Oral BID    have reviewed scheduled and prn medications.  Physical Exam: General: Sitting comfortable, not in distress Heart:RRR, s1s2 nl Lungs:clear [name]/l, no crackle Abdomen:soft, Non-tender,  Extremities: Trace  edema Dialysis Access: Right IJ TDC in place, left upper extremity AV fistula wound healing well with good thrill.  [name] [name] [name] [name] 01/12/2021,8:45 AM  LOS: 11 days

## 2021-01-12 NOTE — Progress Notes (Signed)
Critical hgb 6.8 reported to Dr. Olevia Bowens.

## 2021-01-12 NOTE — Progress Notes (Signed)
TRH night shift telemetry coverage note.  The patient had a critical hemoglobin level of 6.8 g/dL.  A PRBC transfusion was ordered.  Tennis Must, MD.

## 2021-01-12 NOTE — Discharge Summary (Addendum)
Physician Discharge Summary  [name] [name]:[id number] DOB: 10/24/1975 DOA: 12/31/2020  PCP: Riesa Pope, MD  Admit date: 12/31/2020  Discharge date: 01/12/2021  Admitted From: Home.  Disposition:  Home.  Recommendations for Outpatient Follow-up:  Follow up with PCP in 1-2 weeks. Please obtain BMP/CBC in one week. Outpatient hemodialysis set up has been arranged. [facility] on their TTS shift w/ a time of 10:30 am.  Home Health: None Equipment/Devices: None  Discharge Condition: Good CODE STATUS:Full code Diet recommendation: Heart Healthy   Brief Summary/ Hospital Course: This 45 year old female with history of CKD stage IV, hypertension, diabetes mellitus type 2, tobacco abuse, anemia of chronic disease, left AKA, morbid obesity, chronic pain presented in the ED with left flank pain associated with nausea and vomiting.  On presentation, she was found to have worsening AKI with metabolic acidosis and possible UTI.  CT of the abdomen and pelvis showed features concerning for cystitis, gallstones with no cholecystitis.  She was started on IV fluids and antibiotics.  Patient was admitted at [facility] for AKI on CKD.  Nephrology was consulted who recommended transfer patient to [facility] for possible need of hemodialysis.  There was no improvement in renal functions with IV hydration.  Patient was started on hemodialysis, tolerated well.  Patient had so far 3 sessions of hemodialysis during hospitalization.  She was also treated with antibiotics for UTI. She was also given a unit of PRBCs due to low hemoglobin.  Patient feels better, outpatient hemodialysis set up has been arranged.  Patient wants to be discharged.  Cleared from nephrology.  Patient is being discharged home and she will continue dialysis on TTS schedule at outpatient dialysis unit.   She was managed for below problems  Discharge Diagnoses:  Principal Problem:    ARF (acute renal failure) ([place]) Active Problems:   Controlled type 2 diabetes mellitus (HCC)   OSA (obstructive sleep apnea)   Pyelonephritis   Hypertensive urgency   Acute renal failure (ARF) (HCC)  AKI on CKD stage IV : Progression of CKD stage IV now end-stage renal disease requiring hemodialysis Patient presented with worsening renal functions associated with nausea and vomiting. Nephrology consulted patient transferred to [facility] for dialysis. Creatinine 8.49 on 01/04/2021, 7.51 on 01/02/2021.   Discontinue lisinopril and Bumex Strict input and output.  Daily weights.  Fluid restriction.   She underwent tunneled dialysis catheter followed by hemodialysis on 8/16. Patient had right IJ tunneled dialysis catheter and left brachiocephalic fistula placed on 8/16. Patient has undergone 3 dialysis sessions so far,  Next dialysis on 8/23. Metabolic acidosis has resolved.  Process underway for outpatient dialysis unit placement Outpatient hemodialysis set up been arranged.   Hyperkalemia. Improved with HD.  Hypertensive urgency > Improved. Blood pressure currently stable.   Continue amlodipine, metoprolol,  Discontinue hydralazine Discontinue Bumex and lisinopril.   Suspect acute pyelonephritis Completed ceftriaxone for 7 days. Urine culture : Polymicrobial.   Hypothyroidism:  Continue Synthroid  Diabetes mellitus type 2 -A1c 6 in April 2022.   Continue CBGs with SSI   OSA not using CPAP Noncompliant with CPAP.  History of left AKA : Stable,  Morbid obesity Outpatient follow-up  Chronic pain syndrome Continue as needed opiates.  Outpatient follow-up with PCP/pain management   Anemia of chronic disease  -hemoglobin stable @ 7.4 .  No signs of bleeding.  Monitor H/H      Discharge Instructions  Discharge Instructions     Call MD  for:  difficulty breathing, headache or visual disturbances   Complete by: As directed    Call MD for:  persistant dizziness or  light-headedness   Complete by: As directed    Call MD for:  persistant nausea and vomiting   Complete by: As directed    Diet - low sodium heart healthy   Complete by: As directed    Diet Carb Modified   Complete by: As directed    Discharge instructions   Complete by: As directed    Advised to follow-up with primary care physician in 1 week. Advised to follow-up with outpatient hemodialysis set up. Continue dialysis TTS schedule.   Discharge wound care:   Complete by: As directed    Follow up at dialysis .   Increase activity slowly   Complete by: As directed       Allergies as of 01/12/2021       Reactions   Vancomycin Other (See Comments)   Acute renal failure suspected secondary to vanco        Medication List     STOP taking these medications    bumetanide 2 MG tablet Commonly known as: BUMEX   hydrALAZINE 50 MG tablet Commonly known as: APRESOLINE   lisinopril 10 MG tablet Commonly known as: ZESTRIL   sodium bicarbonate 650 MG tablet       TAKE these medications    allopurinol 100 MG tablet Commonly known as: Zyloprim Take 0.5 tablets (50 mg total) by mouth 2 (two) times a week. What changed:  when to take this reasons to take this   amitriptyline 100 MG tablet Commonly known as: ELAVIL Take 1 tablet (100 mg total) by mouth at bedtime.   amLODipine 10 MG tablet Commonly known as: NORVASC Take 1 tablet (10 mg total) by mouth daily.   atorvastatin 20 MG tablet Commonly known as: LIPITOR Take 1 tablet (20 mg total) by mouth daily.   blood glucose meter kit and supplies Kit ICD 10- E11.69. Based on patient and insurance preference. Use up to four times daily as directed.   buPROPion 150 MG 24 hr tablet Commonly known as: Wellbutrin XL Take 1 tablet (150 mg total) by mouth every morning.   calcium acetate 667 MG capsule Commonly known as: PhosLo Take 1 capsule (667 mg total) by mouth 3 (three) times daily with meals.   cyclobenzaprine  5 MG tablet Commonly known as: FLEXERIL Please take one tab as needed for muscle spasms.   diclofenac Sodium 1 % Gel Commonly known as: VOLTAREN Apply 1 application topically 4 (four) times daily as needed (pain).   levothyroxine 88 MCG tablet Commonly known as: SYNTHROID Take 88 mcg by mouth daily.   metoprolol succinate 25 MG 24 hr tablet Commonly known as: TOPROL-XL TAKE 1 TABLET BY MOUTH EVERY DAY   oxyCODONE-acetaminophen 5-325 MG tablet Commonly known as: PERCOCET/ROXICET TAKE 1 OR 2 TABLETS BY MOUTH EVERY DAY AS NEEDED FOR SEVERE PAIN   TRUEplus Pen Needles 31G X 5 MM Misc Generic drug: Insulin Pen Needle Use pen needle with insulin 3 times daily What changed: See the new instructions.   Victoza 18 MG/3ML Sopn Generic drug: liraglutide Inject 1.8 mg into the skin at bedtime.               Discharge Care Instructions  (From admission, onward)           Start     Ordered   01/12/21 0000  Discharge wound care:  Comments: Follow up at dialysis .   01/12/21 1024            Follow-up Information     [facility] Follow up.   Specialty: Vascular Surgery Contact information: [address] [place] [phone]        Riesa Pope, MD Follow up in 1 week(s).   Specialty: Internal Medicine Contact information: [place] [address] [phone]                Allergies  Allergen Reactions   Vancomycin Other (See Comments)    Acute renal failure suspected secondary to vanco    Consultations: Nephrology   Procedures/Studies: CT ABDOMEN PELVIS WO CONTRAST  Result Date: 12/31/2020 CLINICAL DATA:  Abdominal pain, diffuse EXAM: CT ABDOMEN AND PELVIS WITHOUT CONTRAST TECHNIQUE: Multidetector CT imaging of the abdomen and pelvis was performed following the standard protocol without IV contrast. COMPARISON:  October 27, 2019 FINDINGS: Lower chest:  Ground-glass opacities with some interlobular septal thickening and micro nodularity in the lung bases. Hepatobiliary: Unremarkable noncontrast appearance of the hepatic parenchyma. Cholelithiasis without evidence of acute cholecystitis. No biliary ductal dilation. Pancreas: Unremarkable noncontrast appearance of the pancreatic parenchyma. No pancreatic ductal dilation. Spleen: Within normal limits. Adrenals/Urinary Tract: Bilateral adrenal glands are unremarkable. No hydronephrosis. 8 mm left lower pole angiomyolipoma. No renal, ureteral or bladder calculus visualized. Diffuse thickening of the urinary bladder wall. Stomach/Bowel: Stomach is unremarkable. No pathologic dilation of small bowel. Terminal ileum appears normal. Appendix appears normal. There is mild fatty replacement of the ascending and transverse colonic wall. Vascular/Lymphatic: No abdominal aortic aneurysm. Prominent retroperitoneal lymph nodes measuring up to 7 mm at the left common iliac on image 52/2 with unchanged size of this node since May 29, 2019, favoring a benign reactive etiology. Reproductive: Uterus and bilateral adnexa are unremarkable. Other: Mild diffuse nonspecific subcutaneous edema. Misty appearance of the mesentery and retroperitoneum. No walled off fluid collections or significant free fluid. Musculoskeletal: Multilevel degenerative changes spine. No acute osseous abnormality. IMPRESSION: 1. Diffuse thickening of the urinary bladder wall. Correlate with urinalysis to exclude cystitis. 2. Ground-glass opacities with some interlobular septal thickening and micro nodularity in the lung bases. Findings likely reflect an infectious or inflammatory process. 3. Cholelithiasis without evidence of acute cholecystitis. 4. Prominent retroperitoneal lymph nodes measuring up to 7 mm with unchanged size of this node since May 29, 2019, favoring a benign reactive etiology. Electronically Signed   By: Dahlia Bailiff MD   On: 12/31/2020  19:22   DG Chest Port 1 View  Result Date: 01/05/2021 CLINICAL DATA:  Dialysis catheter placement. EXAM: PORTABLE CHEST 1 VIEW COMPARISON:  Chest x-ray dated Oct 21, 2019. FINDINGS: The patient is rotated to the right. New tunneled right internal jugular dialysis catheter with tip at the cavoatrial junction. Left internal jugular central venous catheter with tip likely in the left brachiocephalic vein. Cardiomegaly. Low lung volumes are present, causing crowding of the pulmonary vasculature. Minimal scarring in the peripheral right mid lung. No focal consolidation, pleural effusion, or pneumothorax. No acute osseous abnormality. IMPRESSION: 1. Bilateral internal jugular venous catheters without complicating feature. 2. Low lung volumes.  No active disease. Electronically Signed   By: Titus Dubin M.D.   On: 01/05/2021 11:08   DG Fluoro Guide CV Line-No Report  Result Date: 01/05/2021 Fluoroscopy was utilized by the requesting physician.  No radiographic interpretation.   VAS [place] UPPER EXT VEIN MAPPING (PRE-OP AVF)  Result  Date: 01/04/2021 UPPER EXTREMITY VEIN MAPPING Patient Name:  ANSLIE SPADAFORA  Date of Exam:   01/03/2021 Medical Rec #: [id number]             Accession #:    [id number] Date of Birth: 1975-08-26             Patient Gender: F Patient Age:   44 years Exam Location:  [facility] Procedure:      VAS [place] UPPER EXT VEIN MAPPING (PRE-OP AVF) Referring Phys: Roney Jaffe --------------------------------------------------------------------------------  Indications: ESRD. Limitations: Body habitus, tissue properties, IV, bandages Comparison Study: No prior study on file Performing Technologist: Sharion Dove RVS  Examination Guidelines: A complete evaluation includes B-mode imaging, spectral Doppler, color Doppler, and power Doppler as needed of all accessible portions of each vessel. Bilateral testing is considered an integral part of a complete examination. Limited examinations  for reoccurring indications may be performed as noted. +-----------------+-------------+----------+--------------+ Right Cephalic   Diameter (cm)Depth (cm)   Findings    +-----------------+-------------+----------+--------------+ Prox upper arm                          not visualized +-----------------+-------------+----------+--------------+ Mid upper arm                           not visualized +-----------------+-------------+----------+--------------+ Dist upper arm                          not visualized +-----------------+-------------+----------+--------------+ Antecubital fossa    0.23        1.79                  +-----------------+-------------+----------+--------------+ Prox forearm         0.17        1.77                  +-----------------+-------------+----------+--------------+ Mid forearm          0.14        1.57                  +-----------------+-------------+----------+--------------+ Dist forearm                            not visualized +-----------------+-------------+----------+--------------+ Wrist                                   not visualized +-----------------+-------------+----------+--------------+ Unable to visualize right basilic vein +-----------------+-------------+----------+----------------+ Left Cephalic    Diameter (cm)Depth (cm)    Findings     +-----------------+-------------+----------+----------------+ Prox upper arm       0.25        0.46                    +-----------------+-------------+----------+----------------+ Mid upper arm        0.25        0.67                    +-----------------+-------------+----------+----------------+ Dist upper arm       0.42        0.72   partial thrombus +-----------------+-------------+----------+----------------+ Antecubital fossa    0.67        0.30       thrombus     +-----------------+-------------+----------+----------------+ Prox forearm  not visualized  +-----------------+-------------+----------+----------------+ Mid forearm                              not visualized  +-----------------+-------------+----------+----------------+ Dist forearm                             not visualized  +-----------------+-------------+----------+----------------+ Wrist                                    not visualized  +-----------------+-------------+----------+----------------+ +-----------------+-------------+----------+--------------+ Left Basilic     Diameter (cm)Depth (cm)   Findings    +-----------------+-------------+----------+--------------+ Prox upper arm       0.35        1.72       origin     +-----------------+-------------+----------+--------------+ Mid upper arm        0.32        0.92                  +-----------------+-------------+----------+--------------+ Dist upper arm       0.25        0.75                  +-----------------+-------------+----------+--------------+ Antecubital fossa    0.13        0.11                  +-----------------+-------------+----------+--------------+ Prox forearm         0.09        1.06                  +-----------------+-------------+----------+--------------+ Mid forearm          0.07        0.18                  +-----------------+-------------+----------+--------------+ Distal forearm                          not visualized +-----------------+-------------+----------+--------------+ Wrist                                   not visualized +-----------------+-------------+----------+--------------+ *See table(s) above for measurements and observations.  Diagnosing physician: Monica Martinez MD Electronically signed by Monica Martinez MD on 01/04/2021 at 4:06:27 PM.    Final     Right tunneled dialysis catheter, followed by dialysis  Subjective: Patient was seen and examined at hemodialysis.  Overnight events noted.   outpatient  hemodialysis was arranged.  Patient want to be discharged. patient is being discharged home  Discharge Exam: Vitals:   01/12/21 1000 01/12/21 1033  BP: (!) 155/89 (!) 158/87  Pulse: 80   Resp: 16 16  Temp:  98 F (36.7 C)  SpO2:  99%   Vitals:   01/12/21 0920 01/12/21 0930 01/12/21 1000 01/12/21 1033  BP: (!) 155/87 (!) 156/91 (!) 155/89 (!) 158/87  Pulse:   80   Resp: '15  16 16  ' Temp: 98.1 F (36.7 C)   98 F (36.7 C)  TempSrc:    Oral  SpO2: 99%   99%  Weight:    134.5 kg  Height:        General: Pt is alert, awake, not in acute distress, right [facility] Cardiovascular:  RRR, S1/S2 +, no rubs, no gallops Respiratory: CTA bilaterally, no wheezing, no rhonchi Abdominal: Soft, NT, ND, bowel sounds + Extremities: Left AKA.    The results of significant diagnostics from this hospitalization (including imaging, microbiology, ancillary and laboratory) are listed below for reference.     Microbiology: No results found for this or any previous visit (from the past 240 hour(s)).   Labs: BNP (last 3 results) No results for input(s): BNP in the last 8760 hours. Basic Metabolic Panel: Recent Labs  Lab 01/06/21 2219 01/07/21 0530 01/09/21 1817 01/12/21 0356 01/12/21 0618  NA 133* 133* 131*  --  131*  K 3.7 3.7 3.9  --  4.4  CL 98 100 98  --  100  CO2 '25 25 24  ' --  22  GLUCOSE 136* 115* 225*  --  147*  BUN 41* 43* 51*  --  59*  CREATININE 5.12* 5.47* 5.89*  --  6.84*  CALCIUM 7.9* 8.2* 8.6*  --  8.6*  MG  --  2.0  --  2.2  --   PHOS 4.6 5.5* 5.8* 5.7* 5.8*   Liver Function Tests: Recent Labs  Lab 01/06/21 2219 01/09/21 1817 01/12/21 0618  ALBUMIN 2.8* 2.8* 2.7*   No results for input(s): LIPASE, AMYLASE in the last 168 hours. No results for input(s): AMMONIA in the last 168 hours. CBC: Recent Labs  Lab 01/06/21 0412 01/07/21 0530 01/08/21 0352 01/09/21 0447 01/09/21 1817 01/12/21 0356 01/12/21 0618  WBC 5.8 4.6 5.5  --  5.8  --  5.6  HGB 7.2* 7.0* 7.4*  7.3* 7.5* 6.8* 7.0*  HCT 22.8* 22.4* 24.8* 24.3* 24.6* 21.9* 22.6*  MCV 82.9 84.2 86.1  --  86.6  --  85.6  PLT 183 181 216  --  215  --  176   Cardiac Enzymes: No results for input(s): CKTOTAL, CKMB, CKMBINDEX, TROPONINI in the last 168 hours. BNP: Invalid input(s): POCBNP CBG: Recent Labs  Lab 01/11/21 0648 01/11/21 1159 01/11/21 1649 01/11/21 2242 01/12/21 1125  GLUCAP 124* 130* 136* 206* 93   D-Dimer No results for input(s): DDIMER in the last 72 hours. Hgb A1c No results for input(s): HGBA1C in the last 72 hours. Lipid Profile No results for input(s): CHOL, HDL, LDLCALC, TRIG, CHOLHDL, LDLDIRECT in the last 72 hours. Thyroid function studies No results for input(s): TSH, T4TOTAL, T3FREE, THYROIDAB in the last 72 hours.  Invalid input(s): FREET3 Anemia work up No results for input(s): VITAMINB12, FOLATE, FERRITIN, TIBC, IRON, RETICCTPCT in the last 72 hours. Urinalysis    Component Value Date/Time   COLORURINE YELLOW 12/31/2020 1936   APPEARANCEUR HAZY (A) 12/31/2020 1936   APPEARANCEUR Clear 09/10/2020 1150   LABSPEC 1.016 12/31/2020 1936   PHURINE 6.0 12/31/2020 1936   GLUCOSEU 150 (A) 12/31/2020 1936   GLUCOSEU >=1000 (A) 03/16/2016 1038   HGBUR SMALL (A) 12/31/2020 1936   BILIRUBINUR NEGATIVE 12/31/2020 1936   BILIRUBINUR Negative 09/10/2020 1150   KETONESUR 5 (A) 12/31/2020 1936   PROTEINUR >=300 (A) 12/31/2020 1936   UROBILINOGEN 0.2 01/10/2019 1556   NITRITE NEGATIVE 12/31/2020 1936   LEUKOCYTESUR SMALL (A) 12/31/2020 1936   Sepsis Labs Invalid input(s): PROCALCITONIN,  WBC,  LACTICIDVEN Microbiology No results found for this or any previous visit (from the past 240 hour(s)).   Time coordinating discharge: Over 30 minutes  SIGNED:   Shawna Clamp, MD  [facility] 01/12/2021, 2:07 PM Pager   If 7PM-7AM, please contact night-coverage

## 2021-01-12 NOTE — Progress Notes (Signed)
Out Patient Arrangements:  Pt has been accepted @ [facility] on their TTS shift w/ a time of 10:30 am.  The address is  [address] [place], [place] [address] Phone [phone]  Please advise of d/c date.  Linus Orn [facility] [phone]

## 2021-01-12 NOTE — Progress Notes (Signed)
Hemodialysis- Tolerated well without issue. UF 3L. Patient has no complaints. Discussed dialysis dietary and fluid restrictions. Report called to primary RN.

## 2021-01-12 NOTE — Progress Notes (Signed)
Instructed pt on procedure. HOB less than 45*. Pt held breath upon line removal. Pressure held 5+ min, with pressure drsg applied. Instructed pt to remain in bed for 30 min and report any s/sx of bleeding. Apply pressure and report to nurse. Instructed to keep site CDI for 24 hrs. VU. Fran Lowes, RN VAST

## 2021-01-13 ENCOUNTER — Encounter: Payer: Medicare HMO | Admitting: Physical Therapy

## 2021-01-13 LAB — BPAM RBC
Blood Product Expiration Date: 202209142359
ISSUE DATE / TIME: 202208230811
Unit Type and Rh: 7300

## 2021-01-13 LAB — TYPE AND SCREEN
ABO/RH(D): B POS
Antibody Screen: NEGATIVE
Unit division: 0

## 2021-01-14 DIAGNOSIS — N186 End stage renal disease: Secondary | ICD-10-CM | POA: Diagnosis not present

## 2021-01-14 DIAGNOSIS — N2581 Secondary hyperparathyroidism of renal origin: Secondary | ICD-10-CM | POA: Diagnosis not present

## 2021-01-14 DIAGNOSIS — Z992 Dependence on renal dialysis: Secondary | ICD-10-CM | POA: Diagnosis not present

## 2021-01-15 ENCOUNTER — Ambulatory Visit: Payer: Medicare HMO | Admitting: Physical Therapy

## 2021-01-15 NOTE — TOC Transition Note (Signed)
Transition of care contact from inpatient facility  Date of discharge: 01/13/21 Date of contact: 01/15/21 Method: Phone Spoke to: Patient  Patient contacted to discuss transition of care from recent inpatient hospitalization. Patient was admitted to [facility] from 8/11-8/23/22 with discharge diagnosis of AKI on CKD, Metabolic Acidosis, and Urinary Tract Infection-completed 7-day course Ceftriaxone.  Medication changes were reviewed. Patient was complaining of pain at [facility] insertion site. I advised her to take OTC Tylenol as needed for mild pain and percocet (already prescribed per discharge summary) as needed only for severe pain. Advised to avoid NSAIDs. She reports making her f/u appointments with her PCP and Vascular Surgery today.  Patient will follow up with her outpatient HD unit on: Saturday 8/27 at [facility]. Her next HD session is tomorrow 01/16/21.  Tobie Poet, NP

## 2021-01-16 DIAGNOSIS — Z7689 Persons encountering health services in other specified circumstances: Secondary | ICD-10-CM | POA: Diagnosis not present

## 2021-01-16 DIAGNOSIS — N186 End stage renal disease: Secondary | ICD-10-CM | POA: Diagnosis not present

## 2021-01-16 DIAGNOSIS — N2581 Secondary hyperparathyroidism of renal origin: Secondary | ICD-10-CM | POA: Diagnosis not present

## 2021-01-16 DIAGNOSIS — Z992 Dependence on renal dialysis: Secondary | ICD-10-CM | POA: Diagnosis not present

## 2021-01-18 ENCOUNTER — Telehealth: Payer: Self-pay | Admitting: Orthopedic Surgery

## 2021-01-18 NOTE — Telephone Encounter (Signed)
Patient called asked if she can get a referral to restart (PT) The number to contact patient is [phone]

## 2021-01-19 ENCOUNTER — Other Ambulatory Visit: Payer: Self-pay

## 2021-01-19 ENCOUNTER — Ambulatory Visit: Payer: Medicare HMO

## 2021-01-19 DIAGNOSIS — N186 End stage renal disease: Secondary | ICD-10-CM | POA: Diagnosis not present

## 2021-01-19 DIAGNOSIS — N2581 Secondary hyperparathyroidism of renal origin: Secondary | ICD-10-CM | POA: Diagnosis not present

## 2021-01-19 DIAGNOSIS — Z89612 Acquired absence of left leg above knee: Secondary | ICD-10-CM

## 2021-01-19 DIAGNOSIS — Z992 Dependence on renal dialysis: Secondary | ICD-10-CM | POA: Diagnosis not present

## 2021-01-19 NOTE — Telephone Encounter (Signed)
Order sent.

## 2021-01-20 DIAGNOSIS — Z992 Dependence on renal dialysis: Secondary | ICD-10-CM | POA: Diagnosis not present

## 2021-01-20 DIAGNOSIS — E1122 Type 2 diabetes mellitus with diabetic chronic kidney disease: Secondary | ICD-10-CM | POA: Diagnosis not present

## 2021-01-20 DIAGNOSIS — N186 End stage renal disease: Secondary | ICD-10-CM | POA: Diagnosis not present

## 2021-01-21 ENCOUNTER — Ambulatory Visit: Payer: Medicare HMO | Admitting: Physical Therapy

## 2021-01-21 DIAGNOSIS — N186 End stage renal disease: Secondary | ICD-10-CM | POA: Diagnosis not present

## 2021-01-21 DIAGNOSIS — Z992 Dependence on renal dialysis: Secondary | ICD-10-CM | POA: Diagnosis not present

## 2021-01-21 DIAGNOSIS — N2581 Secondary hyperparathyroidism of renal origin: Secondary | ICD-10-CM | POA: Diagnosis not present

## 2021-01-23 DIAGNOSIS — N2581 Secondary hyperparathyroidism of renal origin: Secondary | ICD-10-CM | POA: Diagnosis not present

## 2021-01-23 DIAGNOSIS — Z992 Dependence on renal dialysis: Secondary | ICD-10-CM | POA: Diagnosis not present

## 2021-01-23 DIAGNOSIS — N186 End stage renal disease: Secondary | ICD-10-CM | POA: Diagnosis not present

## 2021-01-26 DIAGNOSIS — N186 End stage renal disease: Secondary | ICD-10-CM | POA: Diagnosis not present

## 2021-01-26 DIAGNOSIS — Z992 Dependence on renal dialysis: Secondary | ICD-10-CM | POA: Diagnosis not present

## 2021-01-26 DIAGNOSIS — N2581 Secondary hyperparathyroidism of renal origin: Secondary | ICD-10-CM | POA: Diagnosis not present

## 2021-01-26 DIAGNOSIS — Z7689 Persons encountering health services in other specified circumstances: Secondary | ICD-10-CM | POA: Diagnosis not present

## 2021-01-27 ENCOUNTER — Ambulatory Visit (INDEPENDENT_AMBULATORY_CARE_PROVIDER_SITE_OTHER): Payer: Medicare HMO | Admitting: Family

## 2021-01-27 ENCOUNTER — Encounter: Payer: Self-pay | Admitting: Family

## 2021-01-27 DIAGNOSIS — M6281 Muscle weakness (generalized): Secondary | ICD-10-CM

## 2021-01-27 DIAGNOSIS — M1711 Unilateral primary osteoarthritis, right knee: Secondary | ICD-10-CM | POA: Diagnosis not present

## 2021-01-27 DIAGNOSIS — Z89512 Acquired absence of left leg below knee: Secondary | ICD-10-CM | POA: Diagnosis not present

## 2021-01-27 DIAGNOSIS — Z4789 Encounter for other orthopedic aftercare: Secondary | ICD-10-CM

## 2021-01-27 MED ORDER — LIDOCAINE HCL 1 % IJ SOLN
5.0000 mL | INTRAMUSCULAR | Status: AC | PRN
  Administered 2021-01-27: 5 mL

## 2021-01-27 MED ORDER — METHYLPREDNISOLONE ACETATE 40 MG/ML IJ SUSP
40.0000 mg | INTRAMUSCULAR | Status: AC | PRN
  Administered 2021-01-27: 40 mg via INTRA_ARTICULAR

## 2021-01-27 NOTE — Progress Notes (Signed)
Office Visit Note   Patient: [name] [name]           Date of Birth: 1975/12/05           MRN: [id number] Visit Date: 01/27/2021              Requested by: Riesa Pope, MD [place],  [place] [id number] PCP: Riesa Pope, MD  No chief complaint on file.     HPI: The patient is a 45 year old woman who presents today in routine follow-up.  She has been having gradual return of her right knee pain.  History of osteoarthritis.  She has had good relief with Depo-Medrol injections in the past.  She states these often last for 2 to 3 months of relief.  She is recently been having grinding pain behind her kneecap some locking and catching no giving way no edema.  States that the knee will give way if she stands for 5 to 10 minutes  She is also been recently hospitalized.  She has had recent changes to her prosthetic.  She was in physical therapy prior to hospitalization for strengthening they were working on the right knee as well as gait training for the left above-knee amputation prosthesis.  She would like to resume this.  She also is having some breakdown of her liners and prosthesis supplies will provide an order for new supplies today.  Assessment & Plan: Visit Diagnoses: No diagnosis found.  Plan: Physical therapy ordered.  Depo-Medrol injection of the right knee.  Patient tolerated well.  She will follow-up as needed  Follow-Up Instructions: No follow-ups on file.   Right Knee Exam   Tenderness  The patient is experiencing tenderness in the medial joint line and lateral joint line.  Range of Motion  The patient has normal right knee ROM.  Other  Swelling: none Effusion: no effusion present     Patient is alert, oriented, no adenopathy, well-dressed, normal affect, normal respiratory effort. On examination of the left residual limb there is no breakdown no callus no impending breakdown  Imaging: No results found. No images are  attached to the encounter.  Labs: Lab Results  Component Value Date   HGBA1C 6.0 (A) 09/10/2020   HGBA1C 8.5 (A) 02/13/2020   HGBA1C 8.5 (H) 09/04/2019   ESRSEDRATE 130 (H) 03/24/2017   ESRSEDRATE 53 (H) 09/14/2015   ESRSEDRATE 132 (H) 07/06/2012   CRP 13.6 (H) 02/14/2019   CRP 38.6 (H) 04/03/2018   CRP 5.4 (H) 09/14/2015   LABURIC 8.3 (H) 04/15/2019   REPTSTATUS 01/02/2021 FINAL 01/01/2021   GRAMSTAIN  09/04/2019    FEW WBC PRESENT, PREDOMINANTLY PMN MODERATE GRAM POSITIVE COCCI    CULT MULTIPLE SPECIES PRESENT, SUGGEST RECOLLECTION (A) 01/01/2021   LABORGA STAPHYLOCOCCUS AUREUS 09/04/2019     Lab Results  Component Value Date   ALBUMIN 2.7 (L) 01/12/2021   ALBUMIN 2.8 (L) 01/09/2021   ALBUMIN 2.8 (L) 01/06/2021    Lab Results  Component Value Date   MG 2.2 01/12/2021   MG 2.0 01/07/2021   MG 2.3 04/07/2018   No results found for: VD25OH  No results found for: PREALBUMIN CBC EXTENDED Latest Ref Rng & Units 01/12/2021 01/12/2021 01/09/2021  WBC 4.0 - 10.5 K/uL 5.6 - 5.8  RBC 3.87 - 5.11 MIL/uL 2.64(L) - 2.84(L)  HGB 12.0 - 15.0 g/dL 7.0(L) 6.8(LL) 7.5(L)  HCT 36.0 - 46.0 % 22.6(L) 21.9(L) 24.6(L)  PLT 150 - 400 K/uL 176 - 215  NEUTROABS 1.7 -  7.7 K/uL - - -  LYMPHSABS 0.7 - 4.0 K/uL - - -     There is no height or weight on file to calculate BMI.  Orders:  No orders of the defined types were placed in this encounter.  No orders of the defined types were placed in this encounter.    Procedures: Large Joint Inj: R knee on 01/27/2021 9:56 AM Indications: pain Details: 18 G 1.5 in needle, anteromedial approach Medications: 5 mL lidocaine 1 %; 40 mg methylPREDNISolone acetate 40 MG/ML Consent was given by the patient.     Clinical Data: No additional findings.  ROS:  All other systems negative, except as noted in the HPI. Review of Systems  Constitutional:  Negative for chills and fever.  Musculoskeletal:  Positive for arthralgias and gait problem.  Negative for joint swelling.   Objective: Vital Signs: There were no vitals taken for this visit.  Specialty Comments:  No specialty comments available.  PMFS History: Patient Active Problem List   Diagnosis Date Noted   Acute renal failure (ARF) ([place]) 01/01/2021   Pyelonephritis 12/31/2020   ARF (acute renal failure) ([place]) 12/31/2020   Hypertensive urgency 12/31/2020   Muscle spasm 12/16/2020   Cough 05/26/2020   Chronic pain syndrome 01/16/2020   Hx of AKA (above knee amputation), left (HCC)    Acute respiratory failure with hypoxia ([place]) 10/21/2019   Blurry vision, right eye 10/08/2019   Tobacco use 03/01/2019   Phantom limb pain ([place]) 11/08/2018   Current moderate episode of major depressive disorder ([place])    Hyperkalemia    Anemia of chronic disease    Fall    Status post below knee amputation, left ([place]) 04/11/2017   Bipolar affective disorder ([place])    Heart failure with preserved ejection fraction ([place]) 02/09/2017   Routine adult health maintenance 12/08/2016   Chronic kidney disease (CKD), stage III (moderate) ([place]) 07/15/2016   OSA (obstructive sleep apnea) 05/13/2016   Vaginal irritation 03/14/2016   Morbid obesity due to excess calories ([place]) 11/27/2015   Subclinical hypothyroidism 04/25/2013   Controlled type 2 diabetes mellitus ([place]) 07/12/2012   Carpal tunnel syndrome, bilateral 01/25/2012   Diabetic polyneuropathy associated with type 2 diabetes mellitus ([place]) 07/04/2011   Anxiety and depression 06/02/2011   GERD 12/06/2007   Hyperlipidemia 08/29/2007   Hypertension associated with diabetes ([place]) 08/29/2007   Past Medical History:  Diagnosis Date   Abdominal muscle pain 09/08/2016   Abnormal Pap smear of cervix 2009   Abscess    history of multiple abscesses   Acute bilateral low back pain 02/13/2017   Acute blood loss anemia    Acute on chronic renal failure ([place]) 07/12/2012   Acute renal failure ([place]) 07/12/2012   Adjustment disorder with depressed  mood 03/26/2017   AKI (acute kidney injury) ([place])    Anemia 10/21/2019   Anemia of chronic disease 2002   Anxiety    Panic attacks   Bilateral lower extremity edema 05/13/2016   Bipolar disorder ([place])    Cataract    B/L cataract   Cellulitis 05/21/2014   right eye   Chronic bronchitis ([place])    "get it q yr" (05/13/2013)   Chronic pain    Chronic pain of right knee 09/08/2016   Dehiscence of amputation stump (HCC)    Depression    Edema of lower extremity    Endocarditis 2002   subacute bacterial endocarditis.    Family history of anesthesia complication    "my mom has a  hard time coming out from under"   Fibromyalgia    GERD (gastroesophageal reflux disease)    occ   Heart murmur    Herpes simplex type 1 infection 01/16/2018   History of blood transfusion    "just low blood count" (05/13/2013)   Hyperlipidemia    Hypertension    Hypothyroidism    Hypothyroidism, adult 03/21/2014   Hypoxia    Leukocytosis    Necrosis (HCC)    and ulceration   Necrotizing fasciitis s/p OR debridements 07/06/2012   Obesity    OSA on CPAP    does not wear all the time   Peripheral neuropathy    Pneumonia    Severe protein-calorie malnutrition (HCC)    Type II diabetes mellitus ([place])    Type  II   Wound dehiscence 09/04/2019    Family History  Problem Relation Age of Onset   Heart failure Mother    Diabetes Mother    Kidney disease Mother    Kidney disease Father    Diabetes Father    Diabetes Paternal Grandmother    Heart failure Paternal Grandmother    Other Other     Past Surgical History:  Procedure Laterality Date   ABDOMINAL AORTOGRAM W/LOWER EXTREMITY Left 03/19/2019   Procedure: ABDOMINAL AORTOGRAM W/LOWER EXTREMITY;  Surgeon: Serafina Mitchell, MD;  Location: [facility];  Service: Cardiovascular;  Laterality: Left;   ABOVE KNEE LEG AMPUTATION Left 10/11/2019   wound dehisence    AIR/FLUID EXCHANGE Right 12/31/2019   Procedure: AIR/FLUID EXCHANGE;  Surgeon: Jalene Mullet, MD;  Location: [facility];  Service: Ophthalmology;  Laterality: Right;   AMPUTATION Left 04/07/2017   Procedure: LEFT BELOW KNEE AMPUTATION;  Surgeon: Newt Minion, MD;  Location: [facility];  Service: Orthopedics;  Laterality: Left;   AMPUTATION Left 10/11/2019   Procedure: LEFT ABOVE KNEE AMPUTATION;  Surgeon: Newt Minion, MD;  Location: [facility];  Service: Orthopedics;  Laterality: Left;   AV FISTULA PLACEMENT Left 01/05/2021   Procedure: ARTERIOVENOUS (AV) FISTULA CREATION vs. GRAFT;  Surgeon: Angelia Mould, MD;  Location: [facility];  Service: Vascular;  Laterality: Left;   CATARACT EXTRACTION W/PHACO Right 02/11/2020   Procedure: CATARACT EXTRACTION PHACO AND INTRAOCULAR LENS PLACEMENT ([place]);  Surgeon: Jalene Mullet, MD;  Location: [facility];  Service: Ophthalmology;  Laterality: Right;   CORONARY ARTERY BYPASS GRAFT     EYE SURGERY     lazer   GAS INSERTION Right 12/31/2019   Procedure: INSERTION OF GAS;  Surgeon: Jalene Mullet, MD;  Location: [facility];  Service: Ophthalmology;  Laterality: Right;   INCISION AND DRAINAGE ABSCESS     multiple I&Ds   INCISION AND DRAINAGE ABSCESS Left 07/09/2012   Procedure: DRESSING CHANGE, THIGH WOUND;  Surgeon: Harl Bowie, MD;  Location: [facility];  Service: General;  Laterality: Left;   INCISION AND DRAINAGE OF WOUND Left 07/07/2012   Procedure: IRRIGATION AND DEBRIDEMENT WOUND;  Surgeon: Harl Bowie, MD;  Location: [facility];  Service: General;  Laterality: Left;   INCISION AND DRAINAGE PERIRECTAL ABSCESS Left 07/14/2012   Procedure: DEBRIDEMENT OF SKIN & SOFT TISSUE; DRESSING CHANGE UNDER ANESTHESIA;  Surgeon: Gayland Curry, MD,FACS;  Location: [facility];  Service: General;  Laterality: Left;   INCISION AND DRAINAGE PERIRECTAL ABSCESS Left 07/16/2012   Procedure: I&D Left Thigh;  Surgeon: Gwenyth Ober, MD;  Location: [facility];  Service: General;  Laterality: Left;   INCISION AND DRAINAGE PERIRECTAL ABSCESS N/A 01/05/2015  Procedure: IRRIGATION AND  DEBRIDEMENT PERIRECTAL ABSCESS;  Surgeon: Donnie Mesa, MD;  Location: [facility];  Service: General;  Laterality: N/A;   INSERTION OF DIALYSIS CATHETER Right 01/05/2021   Procedure: INSERTION OF DIALYSIS CATHETER;  Surgeon: Angelia Mould, MD;  Location: [facility];  Service: Vascular;  Laterality: Right;   IRRIGATION AND DEBRIDEMENT ABSCESS Left 07/06/2012   Procedure: IRRIGATION AND DEBRIDEMENT ABSCESS BUTTOCKS AND THIGH;  Surgeon: Shann Medal, MD;  Location: [facility];  Service: General;  Laterality: Left;   IRRIGATION AND DEBRIDEMENT ABSCESS Left 08/10/2012   Procedure: IRRIGATION AND DEBRIDEMENT ABSCESS;  Surgeon: Madilyn Hook, DO;  Location: [facility];  Service: General;  Laterality: Left;   PARS PLANA VITRECTOMY Right 12/31/2019   Procedure: PARS PLANA VITRECTOMY WITH 25 GAUGE;  Surgeon: Jalene Mullet, MD;  Location: [facility];  Service: Ophthalmology;  Laterality: Right;   PHOTOCOAGULATION WITH LASER Right 12/31/2019   Procedure: PHOTOCOAGULATION WITH LASER;  Surgeon: Jalene Mullet, MD;  Location: [facility];  Service: Ophthalmology;  Laterality: Right;   REPAIR OF COMPLEX TRACTION RETINAL DETACHMENT Right 12/31/2019   Procedure: REPAIR OF COMPLEX TRACTION RETINAL;  Surgeon: Jalene Mullet, MD;  Location: [facility];  Service: Ophthalmology;  Laterality: Right;   STUMP REVISION Left 06/09/2017   Procedure: REVISION LEFT BELOW KNEE AMPUTATION;  Surgeon: Newt Minion, MD;  Location: [facility];  Service: Orthopedics;  Laterality: Left;   STUMP REVISION Left 09/04/2019   Procedure: LEFT BELOW KNEE AMPUTATION REVISION;  Surgeon: Newt Minion, MD;  Location: [facility];  Service: Orthopedics;  Laterality: Left;   STUMP REVISION Left 09/20/2019   Procedure: LEFT BELOW KNEE AMPUTATION REVISION;  Surgeon: Newt Minion, MD;  Location: [facility];  Service: Orthopedics;  Laterality: Left;   TONSILLECTOMY  1994   Social History   Occupational History   Occupation: disability  Tobacco Use   Smoking status: Every Day     Packs/day: 0.50    Years: 16.00    Pack years: 8.00    Types: Cigarettes    Last attempt to quit: 07/22/2019    Years since quitting: 1.5   Smokeless tobacco: Never  Vaping Use   Vaping Use: Former   Devices: CBD oil  Substance and Sexual Activity   Alcohol use: Yes    Alcohol/week: 0.0 standard drinks    Comment: social   Drug use: No   Sexual activity: Yes

## 2021-01-28 ENCOUNTER — Other Ambulatory Visit: Payer: Self-pay | Admitting: Student

## 2021-01-28 DIAGNOSIS — N186 End stage renal disease: Secondary | ICD-10-CM | POA: Diagnosis not present

## 2021-01-28 DIAGNOSIS — Z992 Dependence on renal dialysis: Secondary | ICD-10-CM | POA: Diagnosis not present

## 2021-01-28 DIAGNOSIS — Z7689 Persons encountering health services in other specified circumstances: Secondary | ICD-10-CM | POA: Diagnosis not present

## 2021-01-28 DIAGNOSIS — N2581 Secondary hyperparathyroidism of renal origin: Secondary | ICD-10-CM | POA: Diagnosis not present

## 2021-01-28 NOTE — Telephone Encounter (Signed)
Per discharge summary, Bumex was discontinued per Dr Dwyane Dee.

## 2021-01-30 DIAGNOSIS — Z992 Dependence on renal dialysis: Secondary | ICD-10-CM | POA: Diagnosis not present

## 2021-01-30 DIAGNOSIS — N186 End stage renal disease: Secondary | ICD-10-CM | POA: Diagnosis not present

## 2021-01-30 DIAGNOSIS — N2581 Secondary hyperparathyroidism of renal origin: Secondary | ICD-10-CM | POA: Diagnosis not present

## 2021-01-30 DIAGNOSIS — Z7689 Persons encountering health services in other specified circumstances: Secondary | ICD-10-CM | POA: Diagnosis not present

## 2021-01-31 ENCOUNTER — Ambulatory Visit: Payer: Medicare HMO

## 2021-02-02 DIAGNOSIS — Z992 Dependence on renal dialysis: Secondary | ICD-10-CM | POA: Diagnosis not present

## 2021-02-02 DIAGNOSIS — N186 End stage renal disease: Secondary | ICD-10-CM | POA: Diagnosis not present

## 2021-02-02 DIAGNOSIS — Z7689 Persons encountering health services in other specified circumstances: Secondary | ICD-10-CM | POA: Diagnosis not present

## 2021-02-02 DIAGNOSIS — N2581 Secondary hyperparathyroidism of renal origin: Secondary | ICD-10-CM | POA: Diagnosis not present

## 2021-02-04 DIAGNOSIS — N2581 Secondary hyperparathyroidism of renal origin: Secondary | ICD-10-CM | POA: Diagnosis not present

## 2021-02-04 DIAGNOSIS — N186 End stage renal disease: Secondary | ICD-10-CM | POA: Diagnosis not present

## 2021-02-04 DIAGNOSIS — Z7689 Persons encountering health services in other specified circumstances: Secondary | ICD-10-CM | POA: Diagnosis not present

## 2021-02-04 DIAGNOSIS — Z992 Dependence on renal dialysis: Secondary | ICD-10-CM | POA: Diagnosis not present

## 2021-02-06 DIAGNOSIS — N2581 Secondary hyperparathyroidism of renal origin: Secondary | ICD-10-CM | POA: Diagnosis not present

## 2021-02-06 DIAGNOSIS — Z7689 Persons encountering health services in other specified circumstances: Secondary | ICD-10-CM | POA: Diagnosis not present

## 2021-02-06 DIAGNOSIS — N186 End stage renal disease: Secondary | ICD-10-CM | POA: Diagnosis not present

## 2021-02-06 DIAGNOSIS — Z992 Dependence on renal dialysis: Secondary | ICD-10-CM | POA: Diagnosis not present

## 2021-02-08 ENCOUNTER — Other Ambulatory Visit: Payer: Self-pay | Admitting: Student

## 2021-02-08 DIAGNOSIS — G894 Chronic pain syndrome: Secondary | ICD-10-CM

## 2021-02-08 NOTE — Telephone Encounter (Signed)
Last office visit: 12/15/20 Last UDS:02/13/20 Last Refill: last written 08/17 #45 Next appt: none scheduled

## 2021-02-09 DIAGNOSIS — N2581 Secondary hyperparathyroidism of renal origin: Secondary | ICD-10-CM | POA: Diagnosis not present

## 2021-02-09 DIAGNOSIS — Z992 Dependence on renal dialysis: Secondary | ICD-10-CM | POA: Diagnosis not present

## 2021-02-09 DIAGNOSIS — Z7689 Persons encountering health services in other specified circumstances: Secondary | ICD-10-CM | POA: Diagnosis not present

## 2021-02-09 DIAGNOSIS — N186 End stage renal disease: Secondary | ICD-10-CM | POA: Diagnosis not present

## 2021-02-10 ENCOUNTER — Other Ambulatory Visit: Payer: Self-pay | Admitting: Internal Medicine

## 2021-02-10 DIAGNOSIS — G894 Chronic pain syndrome: Secondary | ICD-10-CM

## 2021-02-10 MED ORDER — OXYCODONE-ACETAMINOPHEN 2.5-325 MG PO TABS: 1.0000 | ORAL_TABLET | Freq: Two times a day (BID) | ORAL | 0 refills | Status: DC

## 2021-02-10 NOTE — Telephone Encounter (Signed)
Call from [facility] rx for percocet 2.5/325 Per pharmacy this strength is no longer avail "not made at all" Rx was canceled Will send to MD for review Please advise.Despina Hidden Cassady9/21/20223:41 PM

## 2021-02-10 NOTE — Assessment & Plan Note (Signed)
Refill request 02/10/21: Patient on Percocet 5-325mg , 1-2 times a day PRN. With new diagnosis of ESRD, reduced dose of Percocet 2.5-325mg  sent in to pharmacy to be taken 1-2 times a day PRN.

## 2021-02-11 ENCOUNTER — Telehealth: Payer: Self-pay

## 2021-02-11 DIAGNOSIS — N2581 Secondary hyperparathyroidism of renal origin: Secondary | ICD-10-CM | POA: Diagnosis not present

## 2021-02-11 DIAGNOSIS — N186 End stage renal disease: Secondary | ICD-10-CM | POA: Diagnosis not present

## 2021-02-11 DIAGNOSIS — Z992 Dependence on renal dialysis: Secondary | ICD-10-CM | POA: Diagnosis not present

## 2021-02-11 DIAGNOSIS — Z7689 Persons encountering health services in other specified circumstances: Secondary | ICD-10-CM | POA: Diagnosis not present

## 2021-02-11 NOTE — Telephone Encounter (Signed)
Pt is requesting a call back about her medicine  she stated no one called her about the change in her medication  oxycodone-acetaminophen (PERCOCET) 2.5-325 MG tablet

## 2021-02-12 ENCOUNTER — Other Ambulatory Visit: Payer: Self-pay | Admitting: *Deleted

## 2021-02-12 ENCOUNTER — Telehealth: Payer: Self-pay | Admitting: *Deleted

## 2021-02-12 ENCOUNTER — Other Ambulatory Visit: Payer: Self-pay | Admitting: Internal Medicine

## 2021-02-12 DIAGNOSIS — G894 Chronic pain syndrome: Secondary | ICD-10-CM

## 2021-02-12 DIAGNOSIS — N1832 Chronic kidney disease, stage 3b: Secondary | ICD-10-CM

## 2021-02-12 NOTE — Assessment & Plan Note (Signed)
ADDENDUM: Spoke with [facility] and they recommended to refill the Percocet 5-325 mg for the patient, however, she is instructed to now take this medicine as 1/2 a tablet twice a day for severe pain, as opposed to 1 tablet twice a day as she was previously doing.

## 2021-02-12 NOTE — Telephone Encounter (Signed)
Call from [facility]. Stated received a call from Dr Raymondo Band. Dr Hollie Salk said Percocet can be cut in half to be taken 1 tab twice a day and do not have to switch to Dilaudid. Inform current med list has "oxycodone-acetaminophen (PERCOCET) 2.5-325 MG tabletTake 1 tablet by mouth 2 (two) times daily".  Call from pt - stated no one has discussed her medication change with her. Stated she was informed by the pharmacy that there's a change in her medicine. I informed her I will have the doctor call and discuss this with her.

## 2021-02-12 NOTE — Telephone Encounter (Signed)
Requesting to speak with a nurse about pain med. Please call pt back.

## 2021-02-13 DIAGNOSIS — Z992 Dependence on renal dialysis: Secondary | ICD-10-CM | POA: Diagnosis not present

## 2021-02-13 DIAGNOSIS — N2581 Secondary hyperparathyroidism of renal origin: Secondary | ICD-10-CM | POA: Diagnosis not present

## 2021-02-13 DIAGNOSIS — Z7689 Persons encountering health services in other specified circumstances: Secondary | ICD-10-CM | POA: Diagnosis not present

## 2021-02-13 DIAGNOSIS — N186 End stage renal disease: Secondary | ICD-10-CM | POA: Diagnosis not present

## 2021-02-15 ENCOUNTER — Other Ambulatory Visit: Payer: Self-pay | Admitting: Student

## 2021-02-15 NOTE — Telephone Encounter (Signed)
Pt returning a call from Dr. Raymondo Band about her Pain Medication Prescription on Friday 02/12/2021.

## 2021-02-15 NOTE — Telephone Encounter (Signed)
RTC, patient was given Dr. Terrall Laity instructions regarding the Percocet.  Patient states she needs to speak with Dr. Raymondo Band and is requesting she call her.  She was informed Dr. Raymondo Band tried multiple times to call her on Friday and could not get in touch with her, pt asked if there was another phone number she could reach her at.  Pt states, no to try her on the same number. Will forward message to Dr. Raymondo Band. Thank you, SChaplin, RN,BSN

## 2021-02-15 NOTE — Telephone Encounter (Signed)
Call from pt requesting to speak to Dr Raymondo Band about her pain medication. Explained Dr Raymondo Band is currently seeing pts. And I will relay her request to her.

## 2021-02-15 NOTE — Telephone Encounter (Signed)
RTC to patient to relay Dr. Terrall Laity message/instructions regarding pain medication.  Generic VM obtained and message left for patient to call nurse triage line back. SChaplin, RN,BSN

## 2021-02-16 ENCOUNTER — Telehealth: Payer: Self-pay | Admitting: Student

## 2021-02-16 ENCOUNTER — Telehealth: Payer: Self-pay | Admitting: *Deleted

## 2021-02-16 DIAGNOSIS — N186 End stage renal disease: Secondary | ICD-10-CM | POA: Diagnosis not present

## 2021-02-16 DIAGNOSIS — N2581 Secondary hyperparathyroidism of renal origin: Secondary | ICD-10-CM | POA: Diagnosis not present

## 2021-02-16 DIAGNOSIS — Z992 Dependence on renal dialysis: Secondary | ICD-10-CM | POA: Diagnosis not present

## 2021-02-16 NOTE — Telephone Encounter (Signed)
TOC HFU APPT  Name: [name], [name]    Date: 02/24/2021    Time: 10:15 AM    Visit Type: OPEN ESTABLISHED [    Provider: Timothy Lasso, MD

## 2021-02-16 NOTE — Progress Notes (Signed)
Spoke with the patient on 9/26 regarding her percocet dose adjustment. The patient is to take 1/2 of her Percocet 5-325 mg twice a day, as opposed to her previous dose of 1 tablet twice a day. Discussed that the dose adjustment was made in coordination with a nephrologist at the dialysis center given her newly diagnosed ESRD on HD.

## 2021-02-16 NOTE — Telephone Encounter (Signed)
Call from pt - wanting to know which urologist Dr Raymondo Band talked to about her pain medicine. Stated she forgot to ask Dr Raymondo Band yesterday. Stated leave a message on her cell phone b/c she will be at dialysis.

## 2021-02-16 NOTE — Telephone Encounter (Signed)
Called and left a message for [name] [name].  Discussing that we will be making multiple medication dosage changes as she is now on hemodialysis.  I reached out to the front desk to schedule the patient an appointment to be seen in person because these changes as well as discuss other question she may have with new changes.  We have not only changed her Percocet dosing but also her allopurinol dosing.  She will take her allopurinol 3 times weekly 100 mg after her dialysis sessions.  I will also be placing a referral to Dr. Claiborne Billings to assist this with these medication changes.  If [name] calls back and has any further questions please direct them to me.

## 2021-02-17 ENCOUNTER — Ambulatory Visit (INDEPENDENT_AMBULATORY_CARE_PROVIDER_SITE_OTHER): Payer: Medicare HMO | Admitting: Physician Assistant

## 2021-02-17 ENCOUNTER — Other Ambulatory Visit: Payer: Self-pay

## 2021-02-17 ENCOUNTER — Ambulatory Visit (HOSPITAL_COMMUNITY)
Admission: RE | Admit: 2021-02-17 | Discharge: 2021-02-17 | Disposition: A | Discharge status: Home / Self Care | Payer: Medicare HMO | Source: Ambulatory Visit | Attending: Vascular Surgery | Admitting: Vascular Surgery

## 2021-02-17 VITALS — BP 148/88 | HR 90 | Temp 97.8°F | Resp 20 | Ht 69.0 in | Wt 260.0 lb

## 2021-02-17 DIAGNOSIS — N1832 Chronic kidney disease, stage 3b: Secondary | ICD-10-CM | POA: Diagnosis not present

## 2021-02-17 DIAGNOSIS — Z992 Dependence on renal dialysis: Secondary | ICD-10-CM

## 2021-02-17 DIAGNOSIS — N186 End stage renal disease: Secondary | ICD-10-CM

## 2021-02-17 NOTE — H&P (View-Only) (Signed)
Established Dialysis Access   History of Present Illness   [name] [name] is a 45 y.o. (1975-06-02) female who presents for re-evaluation of permanent access.  She is status post right IJ [facility] placement and left brachiocephalic fistula creation with Dr. Scot Dock on 01/05/2021.  Intraoperatively the cephalic vein required a thrombectomy of a thrombus at the level of the AC fossa.  She denies any signs or symptoms of steal syndrome in her left hand.  She is dialyzing via right IJ [facility] on a Tuesday Thursday Saturday schedule without complication.  Surgical history also significant for left below the knee amputation   Current Outpatient Medications  Medication Sig Dispense Refill   allopurinol (ZYLOPRIM) 100 MG tablet Take 1 tablet (100 mg total) by mouth 3 (three) times a week. Take only after dialysis sessions. Do not take on non-dialysis days. 30 tablet 1   amitriptyline (ELAVIL) 100 MG tablet Take 1 tablet (100 mg total) by mouth at bedtime. 30 tablet 0   amLODipine (NORVASC) 10 MG tablet Take 1 tablet (10 mg total) by mouth daily.     atorvastatin (LIPITOR) 20 MG tablet Take 1 tablet (20 mg total) by mouth daily. 90 tablet 1   blood glucose meter kit and supplies KIT ICD 10- E11.69. Based on patient and insurance preference. Use up to four times daily as directed. 1 each 0   buPROPion (WELLBUTRIN XL) 150 MG 24 hr tablet Take 1 tablet (150 mg total) by mouth every morning. 90 tablet 3   calcium acetate (PHOSLO) 667 MG capsule Take 1 capsule (667 mg total) by mouth 3 (three) times daily with meals. 90 capsule 1   cyclobenzaprine (FLEXERIL) 5 MG tablet Please take one tab as needed for muscle spasms. 30 tablet 0   diclofenac Sodium (VOLTAREN) 1 % GEL Apply 1 application topically 4 (four) times daily as needed (pain). 100 g 2   levothyroxine (SYNTHROID) 88 MCG tablet Take 88 mcg by mouth daily.     metoprolol succinate (TOPROL-XL) 25 MG 24 hr tablet TAKE 1 TABLET BY MOUTH EVERY DAY 90 tablet  1   oxyCODONE-acetaminophen (PERCOCET/ROXICET) 5-325 MG tablet TAKE 1/2 TABLET BY MOUTH TWICE A DAY EVERY DAY AS NEEDED FOR SEVERE PAIN 30 tablet 0   TRUEPLUS PEN NEEDLES 31G X 5 MM MISC Use pen needle with insulin 3 times daily (Patient taking differently: 1 each by Other route 3 (three) times daily.) 100 each 5   VICTOZA 18 MG/3ML SOPN Inject 1.8 mg into the skin at bedtime. 27 mL 1   hydrALAZINE (APRESOLINE) 25 MG tablet Take 25 mg by mouth 3 (three) times daily.     sevelamer carbonate (RENVELA) 800 MG tablet Take 800 mg by mouth 3 (three) times daily.     No current facility-administered medications for this visit.    REVIEW OF SYSTEMS (negative unless checked):   Cardiac:  '[]'  Chest pain or chest pressure? '[]'  Shortness of breath upon activity? '[]'  Shortness of breath when lying flat? '[]'  Irregular heart rhythm?  Vascular:  '[]'  Pain in calf, thigh, or hip brought on by walking? '[]'  Pain in feet at night that wakes you up from your sleep? '[]'  Blood clot in your veins? '[]'  Leg swelling?  Pulmonary:  '[]'  Oxygen at home? '[]'  Productive cough? '[]'  Wheezing?  Neurologic:  '[]'  Sudden weakness in arms or legs? '[]'  Sudden numbness in arms or legs? '[]'  Sudden onset of difficult speaking or slurred speech? '[]'  Temporary loss of vision in one  eye? '[]'  Problems with dizziness?  Gastrointestinal:  '[]'  Blood in stool? '[]'  Vomited blood?  Genitourinary:  '[]'  Burning when urinating? '[]'  Blood in urine?  Psychiatric:  '[]'  Major depression  Hematologic:  '[]'  Bleeding problems? '[]'  Problems with blood clotting?  Dermatologic:  '[]'  Rashes or ulcers?  Constitutional:  '[]'  Fever or chills?  Ear/Nose/Throat:  '[]'  Change in hearing? '[]'  Nose bleeds? '[]'  Sore throat?  Musculoskeletal:  '[]'  Back pain? '[]'  Joint pain? '[]'  Muscle pain?   Physical Examination   Vitals:   02/17/21 1446  BP: (!) 148/88  Pulse: 90  Resp: 20  Temp: 97.8 F (36.6 C)  TempSrc: Temporal  SpO2: 99%  Weight: 260 lb  (117.9 kg)  Height: '5\' 9"'  (1.753 m)   Body mass index is 38.4 kg/m.  General:  WDWN in NAD; vital signs documented above Gait: Not observed HENT: WNL, normocephalic Pulmonary: normal non-labored breathing , without Rales, rhonchi,  wheezing Cardiac: regular HR Abdomen: soft, NT, no masses Skin: without rashes Vascular Exam/Pulses:  Right Left  Radial 2+ (normal) 2+ (normal)   Extremities: without ischemic changes, without Gangrene , without cellulitis; without open wounds;  Musculoskeletal: no muscle wasting or atrophy  Neurologic: A&O X 3;  No focal weakness or paresthesias are detected Psychiatric:  The pt has Normal affect.   Non-invasive Vascular Imaging   Patent left brachiocephalic fistula greater than 7 mm from the surface with an area of stenosis of the [facility] fossa    Medical Decision Making   [name] [name] is a 45 y.o. female who presents with ESRD requiring hemodialysis.   Patent left brachiocephalic fistula without signs or symptoms of steal syndrome in her left hand There is a palpable thrill of the fistula throughout the upper arm however unfortunately it is greater than 7 mm from the surface of the skin.  It would be difficult to reliably cannulate this fistula on a weekly basis.  For this reason I have recommended superficialization of fistula.  There is also an area of stenosis just beyond the anastomosis likely where the vein was thrombectomized during creation of fistula.  This can also be evaluated intraoperatively.   Risk, benefits, and alternatives to access surgery were discussed.  The patient is aware the risks include but are not limited to: bleeding, infection, steal syndrome, nerve damage, thrombosis, failure to mature, and need for additional procedures.  She is agreeable to proceed with the above-mentioned procedure.    Dagoberto Ligas PA-C [facility] Office: [phone]  Clinic MD: Dr. Scot Dock

## 2021-02-17 NOTE — Progress Notes (Signed)
Established Dialysis Access   History of Present Illness   [name] [name] is a 45 y.o. (12-24-1975) female who presents for re-evaluation of permanent access.  She is status post right IJ [facility] placement and left brachiocephalic fistula creation with Dr. Scot Dock on 01/05/2021.  Intraoperatively the cephalic vein required a thrombectomy of a thrombus at the level of the AC fossa.  She denies any signs or symptoms of steal syndrome in her left hand.  She is dialyzing via right IJ [facility] on a Tuesday Thursday Saturday schedule without complication.  Surgical history also significant for left below the knee amputation   Current Outpatient Medications  Medication Sig Dispense Refill   allopurinol (ZYLOPRIM) 100 MG tablet Take 1 tablet (100 mg total) by mouth 3 (three) times a week. Take only after dialysis sessions. Do not take on non-dialysis days. 30 tablet 1   amitriptyline (ELAVIL) 100 MG tablet Take 1 tablet (100 mg total) by mouth at bedtime. 30 tablet 0   amLODipine (NORVASC) 10 MG tablet Take 1 tablet (10 mg total) by mouth daily.     atorvastatin (LIPITOR) 20 MG tablet Take 1 tablet (20 mg total) by mouth daily. 90 tablet 1   blood glucose meter kit and supplies KIT ICD 10- E11.69. Based on patient and insurance preference. Use up to four times daily as directed. 1 each 0   buPROPion (WELLBUTRIN XL) 150 MG 24 hr tablet Take 1 tablet (150 mg total) by mouth every morning. 90 tablet 3   calcium acetate (PHOSLO) 667 MG capsule Take 1 capsule (667 mg total) by mouth 3 (three) times daily with meals. 90 capsule 1   cyclobenzaprine (FLEXERIL) 5 MG tablet Please take one tab as needed for muscle spasms. 30 tablet 0   diclofenac Sodium (VOLTAREN) 1 % GEL Apply 1 application topically 4 (four) times daily as needed (pain). 100 g 2   levothyroxine (SYNTHROID) 88 MCG tablet Take 88 mcg by mouth daily.     metoprolol succinate (TOPROL-XL) 25 MG 24 hr tablet TAKE 1 TABLET BY MOUTH EVERY DAY 90 tablet  1   oxyCODONE-acetaminophen (PERCOCET/ROXICET) 5-325 MG tablet TAKE 1/2 TABLET BY MOUTH TWICE A DAY EVERY DAY AS NEEDED FOR SEVERE PAIN 30 tablet 0   TRUEPLUS PEN NEEDLES 31G X 5 MM MISC Use pen needle with insulin 3 times daily (Patient taking differently: 1 each by Other route 3 (three) times daily.) 100 each 5   VICTOZA 18 MG/3ML SOPN Inject 1.8 mg into the skin at bedtime. 27 mL 1   hydrALAZINE (APRESOLINE) 25 MG tablet Take 25 mg by mouth 3 (three) times daily.     sevelamer carbonate (RENVELA) 800 MG tablet Take 800 mg by mouth 3 (three) times daily.     No current facility-administered medications for this visit.    REVIEW OF SYSTEMS (negative unless checked):   Cardiac:  '[]'  Chest pain or chest pressure? '[]'  Shortness of breath upon activity? '[]'  Shortness of breath when lying flat? '[]'  Irregular heart rhythm?  Vascular:  '[]'  Pain in calf, thigh, or hip brought on by walking? '[]'  Pain in feet at night that wakes you up from your sleep? '[]'  Blood clot in your veins? '[]'  Leg swelling?  Pulmonary:  '[]'  Oxygen at home? '[]'  Productive cough? '[]'  Wheezing?  Neurologic:  '[]'  Sudden weakness in arms or legs? '[]'  Sudden numbness in arms or legs? '[]'  Sudden onset of difficult speaking or slurred speech? '[]'  Temporary loss of vision in one  eye? '[]'  Problems with dizziness?  Gastrointestinal:  '[]'  Blood in stool? '[]'  Vomited blood?  Genitourinary:  '[]'  Burning when urinating? '[]'  Blood in urine?  Psychiatric:  '[]'  Major depression  Hematologic:  '[]'  Bleeding problems? '[]'  Problems with blood clotting?  Dermatologic:  '[]'  Rashes or ulcers?  Constitutional:  '[]'  Fever or chills?  Ear/Nose/Throat:  '[]'  Change in hearing? '[]'  Nose bleeds? '[]'  Sore throat?  Musculoskeletal:  '[]'  Back pain? '[]'  Joint pain? '[]'  Muscle pain?   Physical Examination   Vitals:   02/17/21 1446  BP: (!) 148/88  Pulse: 90  Resp: 20  Temp: 97.8 F (36.6 C)  TempSrc: Temporal  SpO2: 99%  Weight: 260 lb  (117.9 kg)  Height: '5\' 9"'  (1.753 m)   Body mass index is 38.4 kg/m.  General:  WDWN in NAD; vital signs documented above Gait: Not observed HENT: WNL, normocephalic Pulmonary: normal non-labored breathing , without Rales, rhonchi,  wheezing Cardiac: regular HR Abdomen: soft, NT, no masses Skin: without rashes Vascular Exam/Pulses:  Right Left  Radial 2+ (normal) 2+ (normal)   Extremities: without ischemic changes, without Gangrene , without cellulitis; without open wounds;  Musculoskeletal: no muscle wasting or atrophy  Neurologic: A&O X 3;  No focal weakness or paresthesias are detected Psychiatric:  The pt has Normal affect.   Non-invasive Vascular Imaging   Patent left brachiocephalic fistula greater than 7 mm from the surface with an area of stenosis of the [facility] fossa    Medical Decision Making   [name] [name] is a 45 y.o. female who presents with ESRD requiring hemodialysis.   Patent left brachiocephalic fistula without signs or symptoms of steal syndrome in her left hand There is a palpable thrill of the fistula throughout the upper arm however unfortunately it is greater than 7 mm from the surface of the skin.  It would be difficult to reliably cannulate this fistula on a weekly basis.  For this reason I have recommended superficialization of fistula.  There is also an area of stenosis just beyond the anastomosis likely where the vein was thrombectomized during creation of fistula.  This can also be evaluated intraoperatively.   Risk, benefits, and alternatives to access surgery were discussed.  The patient is aware the risks include but are not limited to: bleeding, infection, steal syndrome, nerve damage, thrombosis, failure to mature, and need for additional procedures.  She is agreeable to proceed with the above-mentioned procedure.    Dagoberto Ligas PA-C [facility] [place] Office: [phone]  Clinic MD: Dr. Scot Dock

## 2021-02-18 DIAGNOSIS — N186 End stage renal disease: Secondary | ICD-10-CM | POA: Diagnosis not present

## 2021-02-18 DIAGNOSIS — Z7689 Persons encountering health services in other specified circumstances: Secondary | ICD-10-CM | POA: Diagnosis not present

## 2021-02-18 DIAGNOSIS — N2581 Secondary hyperparathyroidism of renal origin: Secondary | ICD-10-CM | POA: Diagnosis not present

## 2021-02-18 DIAGNOSIS — Z992 Dependence on renal dialysis: Secondary | ICD-10-CM | POA: Diagnosis not present

## 2021-02-19 DIAGNOSIS — N186 End stage renal disease: Secondary | ICD-10-CM | POA: Diagnosis not present

## 2021-02-19 DIAGNOSIS — Z992 Dependence on renal dialysis: Secondary | ICD-10-CM | POA: Diagnosis not present

## 2021-02-19 DIAGNOSIS — E1122 Type 2 diabetes mellitus with diabetic chronic kidney disease: Secondary | ICD-10-CM | POA: Diagnosis not present

## 2021-02-20 DIAGNOSIS — N2581 Secondary hyperparathyroidism of renal origin: Secondary | ICD-10-CM | POA: Diagnosis not present

## 2021-02-20 DIAGNOSIS — Z7689 Persons encountering health services in other specified circumstances: Secondary | ICD-10-CM | POA: Diagnosis not present

## 2021-02-20 DIAGNOSIS — N186 End stage renal disease: Secondary | ICD-10-CM | POA: Diagnosis not present

## 2021-02-20 DIAGNOSIS — Z992 Dependence on renal dialysis: Secondary | ICD-10-CM | POA: Diagnosis not present

## 2021-02-21 ENCOUNTER — Encounter (HOSPITAL_COMMUNITY): Payer: Self-pay | Admitting: Vascular Surgery

## 2021-02-21 NOTE — Progress Notes (Signed)
PCP - Dr. Johnney Ou Cardiologist - na EKG - 12/31/20 Chest x-ray - 01/05/21  (1 view) ECHO - 12/31/20 Cardiac Cath - na CPAP - does not use  Fasting Blood Sugar:  pt. Reports she doesn't check, but will the day of surgery  Blood Thinner Instructions: na Aspirin Instructions: na    COVID TEST- na  Anesthesia review: na  Goes to dialysis on Tues/thur/Sat. Pt. States she is going to dialysis on 02/22/21.   Pt. Reports that [facility] transportation is picking her up and bring her home. States it is all set up and after surgery her personal care aide will be waiting at home for her.  -------------  SDW INSTRUCTIONS:  Your procedure is scheduled on 02/23/21. Please report to [facility] Main Entrance "A" at 10:00 A.M., and check in at the Admitting office. Call this number if you have problems the morning of surgery: [phone]   Remember: Do not eat or drink after midnight the night before your surgery    Medications to take morning of surgery with a sip of water include: Amlodipine, lipitor,wellbutrin,levothyroxine, metoprolol, prn: flexeril, oxycodone  As of today, STOP taking any Aspirin (unless otherwise instructed by your surgeon), Aleve, Naproxen, Ibuprofen, Motrin, Advil, Goody's, BC's, all herbal medications, fish oil, and all vitamins.    The Morning of Surgery Do not wear jewelry, make-up or nail polish. Do not wear lotions, powders, or perfumes/colognes, or deodorant Do not shave 48 hours prior to surgery.   Men may shave face and neck. Do not bring valuables to the hospital. [facility] is not responsible for any belongings or valuables.  If you are a smoker, DO NOT Smoke 24 hours prior to surgery  If you wear a CPAP at night please bring your mask the morning of surgery   Remember that you must have someone to transport you home after your surgery, and remain with you for 24 hours if you are discharged the same day.  Please bring cases for contacts,  glasses, hearing aids, dentures or bridgework because it cannot be worn into surgery.   Patients discharged the day of surgery will not be allowed to drive home.   Please shower the NIGHT BEFORE/MORNING OF SURGERY (use antibacterial soap like DIAL soap if possible). Wear comfortable clothes the morning of surgery. Oral Hygiene is also important to reduce your risk of infection.  Remember - BRUSH YOUR TEETH THE MORNING OF SURGERY WITH YOUR REGULAR TOOTHPASTE  Patient denies shortness of breath, fever, cough and chest pain.

## 2021-02-22 DIAGNOSIS — Z992 Dependence on renal dialysis: Secondary | ICD-10-CM | POA: Diagnosis not present

## 2021-02-22 DIAGNOSIS — N2581 Secondary hyperparathyroidism of renal origin: Secondary | ICD-10-CM | POA: Diagnosis not present

## 2021-02-22 DIAGNOSIS — Z7689 Persons encountering health services in other specified circumstances: Secondary | ICD-10-CM | POA: Diagnosis not present

## 2021-02-22 DIAGNOSIS — N186 End stage renal disease: Secondary | ICD-10-CM | POA: Diagnosis not present

## 2021-02-22 NOTE — Telephone Encounter (Signed)
Transition Care Management Follow-up Telephone Call Date of discharge and from where: Discharged 01/12/21 from the hospital How have you been since you were released from the hospital? Stated she has been doing "ok". Any questions or concerns? No concerns at this time.  Items Reviewed: Did the pt receive and understand the discharge instructions provided? Yes  Medications obtained and verified? Yes  Any new allergies since your discharge? No  Dietary orders reviewed? Yes stated she has been trying to maintain a heart healthy diet. Do you have support at home? Yes , her daughter.  Home Care and Equipment/Supplies: Were home health services ordered? no  Functional Questionnaire: (I = Independent and D = Dependent) ADLs: I  Bathing/Dressing- I  Meal Prep- I  Eating- I  Maintaining continence- I  Transferring/Ambulation- I  Managing Meds- I  Follow up appointments reviewed:  PCP Hospital f/u appt confirmed? Yes  Scheduled to see Dr Jeanice Lim on 02/24/21 @ 1015 Am.Marland [facility] f/u appt confirmed? N/A. Are transportation arrangements needed? No  If their condition worsens, is the pt aware to call PCP or go to the Emergency Dept.? Yes Was the patient provided with contact information for the PCP's office or ED? Yes Was to pt encouraged to call back with questions or concerns? Yes

## 2021-02-23 ENCOUNTER — Encounter (HOSPITAL_COMMUNITY): Payer: Self-pay | Admitting: Vascular Surgery

## 2021-02-23 ENCOUNTER — Other Ambulatory Visit: Payer: Self-pay

## 2021-02-23 ENCOUNTER — Ambulatory Visit (HOSPITAL_COMMUNITY)
Admission: RE | Admit: 2021-02-23 | Discharge: 2021-02-23 | Disposition: A | Discharge status: Home / Self Care | Payer: Medicare HMO | Attending: Vascular Surgery | Admitting: Vascular Surgery

## 2021-02-23 ENCOUNTER — Ambulatory Visit (HOSPITAL_COMMUNITY): Payer: Medicare HMO | Admitting: Anesthesiology

## 2021-02-23 ENCOUNTER — Encounter (HOSPITAL_COMMUNITY)
Admission: RE | Disposition: A | Discharge status: Home / Self Care | Payer: Self-pay | Source: Home / Self Care | Attending: Vascular Surgery

## 2021-02-23 ENCOUNTER — Other Ambulatory Visit: Payer: Self-pay | Admitting: Physician Assistant

## 2021-02-23 DIAGNOSIS — Z79899 Other long term (current) drug therapy: Secondary | ICD-10-CM | POA: Insufficient documentation

## 2021-02-23 DIAGNOSIS — G894 Chronic pain syndrome: Secondary | ICD-10-CM

## 2021-02-23 DIAGNOSIS — F17200 Nicotine dependence, unspecified, uncomplicated: Secondary | ICD-10-CM | POA: Insufficient documentation

## 2021-02-23 DIAGNOSIS — N186 End stage renal disease: Secondary | ICD-10-CM

## 2021-02-23 DIAGNOSIS — Z7985 Long-term (current) use of injectable non-insulin antidiabetic drugs: Secondary | ICD-10-CM | POA: Diagnosis not present

## 2021-02-23 DIAGNOSIS — Z79890 Hormone replacement therapy (postmenopausal): Secondary | ICD-10-CM | POA: Diagnosis not present

## 2021-02-23 DIAGNOSIS — I160 Hypertensive urgency: Secondary | ICD-10-CM | POA: Diagnosis not present

## 2021-02-23 DIAGNOSIS — Z89512 Acquired absence of left leg below knee: Secondary | ICD-10-CM | POA: Diagnosis not present

## 2021-02-23 DIAGNOSIS — I5030 Unspecified diastolic (congestive) heart failure: Secondary | ICD-10-CM | POA: Diagnosis not present

## 2021-02-23 DIAGNOSIS — N179 Acute kidney failure, unspecified: Secondary | ICD-10-CM | POA: Diagnosis not present

## 2021-02-23 DIAGNOSIS — I130 Hypertensive heart and chronic kidney disease with heart failure and stage 1 through stage 4 chronic kidney disease, or unspecified chronic kidney disease: Secondary | ICD-10-CM | POA: Diagnosis not present

## 2021-02-23 DIAGNOSIS — Z452 Encounter for adjustment and management of vascular access device: Secondary | ICD-10-CM | POA: Diagnosis not present

## 2021-02-23 DIAGNOSIS — T82898A Other specified complication of vascular prosthetic devices, implants and grafts, initial encounter: Secondary | ICD-10-CM | POA: Diagnosis not present

## 2021-02-23 DIAGNOSIS — N183 Chronic kidney disease, stage 3 unspecified: Secondary | ICD-10-CM | POA: Diagnosis not present

## 2021-02-23 HISTORY — PX: FISTULA SUPERFICIALIZATION: SHX6341

## 2021-02-23 HISTORY — DX: Angina pectoris, unspecified: I20.9

## 2021-02-23 LAB — GLUCOSE, CAPILLARY
Glucose-Capillary: 82 mg/dL (ref 70–99)
Glucose-Capillary: 67 mg/dL — ABNORMAL LOW (ref 70–99)
Glucose-Capillary: 91 mg/dL (ref 70–99)

## 2021-02-23 LAB — POCT I-STAT, CHEM 8
BUN: 49 mg/dL — ABNORMAL HIGH (ref 6–20)
Calcium, Ion: 1.01 mmol/L — ABNORMAL LOW (ref 1.15–1.40)
Chloride: 107 mmol/L (ref 98–111)
Creatinine, Ser: 4.9 mg/dL — ABNORMAL HIGH (ref 0.44–1.00)
Glucose, Bld: 83 mg/dL (ref 70–99)
HCT: 36 % (ref 36.0–46.0)
Hemoglobin: 12.2 g/dL (ref 12.0–15.0)
Potassium: 5.1 mmol/L (ref 3.5–5.1)
Sodium: 139 mmol/L (ref 135–145)
TCO2: 27 mmol/L (ref 22–32)

## 2021-02-23 LAB — POCT PREGNANCY, URINE: Preg Test, Ur: NEGATIVE

## 2021-02-23 SURGERY — FISTULA SUPERFICIALIZATION
Anesthesia: Monitor Anesthesia Care | Laterality: Left

## 2021-02-23 MED ORDER — FENTANYL CITRATE (PF) 100 MCG/2ML IJ SOLN
INTRAMUSCULAR | Status: AC
  Filled 2021-02-23: qty 2

## 2021-02-23 MED ORDER — LIDOCAINE HCL 1 % IJ SOLN
INTRAMUSCULAR | Status: DC | PRN
  Administered 2021-02-23: 9 mL

## 2021-02-23 MED ORDER — OXYCODONE-ACETAMINOPHEN 5-325 MG PO TABS: 1.0000 | ORAL_TABLET | Freq: Four times a day (QID) | ORAL | 0 refills | Status: DC | PRN

## 2021-02-23 MED ORDER — FENTANYL CITRATE (PF) 100 MCG/2ML IJ SOLN
25.0000 ug | INTRAMUSCULAR | Status: DC | PRN
  Administered 2021-02-23: 50 ug via INTRAVENOUS

## 2021-02-23 MED ORDER — PROMETHAZINE HCL 25 MG/ML IJ SOLN: 6.2500 mg | INTRAMUSCULAR | Status: DC | PRN

## 2021-02-23 MED ORDER — HEPARIN 6000 UNIT IRRIGATION SOLUTION
Status: DC | PRN
  Administered 2021-02-23: 1

## 2021-02-23 MED ORDER — 0.9 % SODIUM CHLORIDE (POUR BTL) OPTIME
TOPICAL | Status: DC | PRN
  Administered 2021-02-23: 1000 mL

## 2021-02-23 MED ORDER — ONDANSETRON HCL 4 MG/2ML IJ SOLN
INTRAMUSCULAR | Status: DC | PRN
  Administered 2021-02-23: 4 mg via INTRAVENOUS

## 2021-02-23 MED ORDER — CHLORHEXIDINE GLUCONATE 0.12 % MT SOLN
15.0000 mL | Freq: Once | OROMUCOSAL | Status: DC
  Filled 2021-02-23: qty 15

## 2021-02-23 MED ORDER — OXYCODONE HCL 5 MG PO TABS: 5.0000 mg | ORAL_TABLET | Freq: Once | ORAL | Status: DC | PRN

## 2021-02-23 MED ORDER — FENTANYL CITRATE (PF) 100 MCG/2ML IJ SOLN
INTRAMUSCULAR | Status: DC | PRN
  Administered 2021-02-23 (×2): 50 ug via INTRAVENOUS

## 2021-02-23 MED ORDER — HEPARIN SODIUM (PORCINE) 1000 UNIT/ML IJ SOLN
INTRAMUSCULAR | Status: AC
  Filled 2021-02-23: qty 1

## 2021-02-23 MED ORDER — ONDANSETRON HCL 4 MG/2ML IJ SOLN
INTRAMUSCULAR | Status: AC
  Filled 2021-02-23: qty 2

## 2021-02-23 MED ORDER — CHLORHEXIDINE GLUCONATE 4 % EX LIQD: 60.0000 mL | Freq: Once | CUTANEOUS | Status: DC

## 2021-02-23 MED ORDER — HYDRALAZINE HCL 20 MG/ML IJ SOLN
INTRAMUSCULAR | Status: DC | PRN
  Administered 2021-02-23: 10 mg via INTRAVENOUS

## 2021-02-23 MED ORDER — SODIUM CHLORIDE 0.9 % IV SOLN: INTRAVENOUS | Status: DC

## 2021-02-23 MED ORDER — MIDAZOLAM HCL 2 MG/2ML IJ SOLN
INTRAMUSCULAR | Status: AC
  Filled 2021-02-23: qty 2

## 2021-02-23 MED ORDER — PROPOFOL 10 MG/ML IV BOLUS
INTRAVENOUS | Status: AC
  Filled 2021-02-23: qty 20

## 2021-02-23 MED ORDER — SODIUM CHLORIDE 0.9 % IV SOLN
INTRAVENOUS | Status: DC
Start: 2021-02-23 — End: 2021-02-25

## 2021-02-23 MED ORDER — PROTAMINE SULFATE 10 MG/ML IV SOLN
INTRAVENOUS | Status: AC
  Filled 2021-02-23: qty 5

## 2021-02-23 MED ORDER — CEFAZOLIN SODIUM-DEXTROSE 2-4 GM/100ML-% IV SOLN
2.0000 g | INTRAVENOUS | Status: AC
  Administered 2021-02-23: 2 g via INTRAVENOUS
  Filled 2021-02-23: qty 100

## 2021-02-23 MED ORDER — LIDOCAINE 2% (20 MG/ML) 5 ML SYRINGE
INTRAMUSCULAR | Status: AC
  Filled 2021-02-23: qty 5

## 2021-02-23 MED ORDER — LIDOCAINE-EPINEPHRINE (PF) 1 %-1:200000 IJ SOLN
INTRAMUSCULAR | Status: DC | PRN
  Administered 2021-02-23: 30 mL

## 2021-02-23 MED ORDER — ORAL CARE MOUTH RINSE: 15.0000 mL | Freq: Once | OROMUCOSAL | Status: DC

## 2021-02-23 MED ORDER — MIDAZOLAM HCL 5 MG/5ML IJ SOLN
INTRAMUSCULAR | Status: DC | PRN
  Administered 2021-02-23: 2 mg via INTRAVENOUS

## 2021-02-23 MED ORDER — PROPOFOL 500 MG/50ML IV EMUL
INTRAVENOUS | Status: DC | PRN
  Administered 2021-02-23: 50 ug/kg/min via INTRAVENOUS

## 2021-02-23 MED ORDER — PROPOFOL 10 MG/ML IV BOLUS
INTRAVENOUS | Status: DC | PRN
  Administered 2021-02-23: 30 mg via INTRAVENOUS

## 2021-02-23 MED ORDER — FENTANYL CITRATE (PF) 250 MCG/5ML IJ SOLN
INTRAMUSCULAR | Status: AC
  Filled 2021-02-23: qty 5

## 2021-02-23 MED ORDER — OXYCODONE HCL 5 MG/5ML PO SOLN: 5.0000 mg | Freq: Once | ORAL | Status: DC | PRN

## 2021-02-23 SURGICAL SUPPLY — 35 items
ADH SKN CLS APL DERMABOND .7 (GAUZE/BANDAGES/DRESSINGS) ×1
ARMBAND PINK RESTRICT EXTREMIT (MISCELLANEOUS) ×2 IMPLANT
BAG COUNTER SPONGE SURGICOUNT (BAG) ×2 IMPLANT
BAG SPNG CNTER NS LX DISP (BAG) ×1
CANISTER SUCT 3000ML PPV (MISCELLANEOUS) ×2 IMPLANT
CANNULA VESSEL 3MM 2 BLNT TIP (CANNULA) ×2 IMPLANT
CLIP VESOCCLUDE MED 6/CT (CLIP) ×2 IMPLANT
CLIP VESOCCLUDE SM WIDE 6/CT (CLIP) ×2 IMPLANT
COVER PROBE W GEL 5X96 (DRAPES) ×2 IMPLANT
DECANTER SPIKE VIAL GLASS SM (MISCELLANEOUS) ×2 IMPLANT
DERMABOND ADVANCED (GAUZE/BANDAGES/DRESSINGS) ×1
DERMABOND ADVANCED .7 DNX12 (GAUZE/BANDAGES/DRESSINGS) ×1 IMPLANT
ELECT REM PT RETURN 9FT ADLT (ELECTROSURGICAL) ×2
ELECTRODE REM PT RTRN 9FT ADLT (ELECTROSURGICAL) ×1 IMPLANT
GAUZE 4X4 16PLY ~~LOC~~+RFID DBL (SPONGE) ×1 IMPLANT
GLOVE SRG 8 PF TXTR STRL LF DI (GLOVE) ×1 IMPLANT
GLOVE SURG ENC MOIS LTX SZ7.5 (GLOVE) ×2 IMPLANT
GLOVE SURG UNDER POLY LF SZ8 (GLOVE) ×2
GOWN STRL REUS W/ TWL LRG LVL3 (GOWN DISPOSABLE) ×3 IMPLANT
GOWN STRL REUS W/TWL LRG LVL3 (GOWN DISPOSABLE) ×8
KIT BASIN OR (CUSTOM PROCEDURE TRAY) ×2 IMPLANT
KIT TURNOVER KIT B (KITS) ×2 IMPLANT
NS IRRIG 1000ML POUR BTL (IV SOLUTION) ×2 IMPLANT
PACK CV ACCESS (CUSTOM PROCEDURE TRAY) ×2 IMPLANT
PAD ARMBOARD 7.5X6 YLW CONV (MISCELLANEOUS) ×4 IMPLANT
SPONGE SURGIFOAM ABS GEL 100 (HEMOSTASIS) IMPLANT
SPONGE T-LAP 18X18 ~~LOC~~+RFID (SPONGE) ×1 IMPLANT
STOCKINETTE IMPERVIOUS 9X36 MD (GAUZE/BANDAGES/DRESSINGS) ×1 IMPLANT
SUT MNCRL AB 4-0 PS2 18 (SUTURE) ×4 IMPLANT
SUT PROLENE 6 0 BV (SUTURE) ×5 IMPLANT
SUT VIC AB 3-0 SH 27 (SUTURE) ×6
SUT VIC AB 3-0 SH 27X BRD (SUTURE) ×1 IMPLANT
TOWEL GREEN STERILE (TOWEL DISPOSABLE) ×2 IMPLANT
UNDERPAD 30X36 HEAVY ABSORB (UNDERPADS AND DIAPERS) ×2 IMPLANT
WATER STERILE IRR 1000ML POUR (IV SOLUTION) ×2 IMPLANT

## 2021-02-23 NOTE — Interval H&P Note (Signed)
History and Physical Interval Note:  02/23/2021 9:21 AM  [name] [name]  has presented today for surgery, with the diagnosis of ESRD.  The various methods of treatment have been discussed with the patient and family. After consideration of risks, benefits and other options for treatment, the patient has consented to  Procedure(s): SUPERFICIALIZATION OF LEFT BRACHIOCEPHALIC FISTULA (Left) as a surgical intervention.  The patient's history has been reviewed, patient examined, no change in status, stable for surgery.  I have reviewed the patient's chart and labs.  Questions were answered to the patient's satisfaction.     [name]

## 2021-02-23 NOTE — Anesthesia Procedure Notes (Signed)
Procedure Name: MAC Date/Time: 02/23/2021 10:35 AM Performed by: Michele Rockers, CRNA Pre-anesthesia Checklist: Patient identified, Emergency Drugs available, Suction available, Timeout performed and Patient being monitored Patient Re-evaluated:Patient Re-evaluated prior to induction Oxygen Delivery Method: Simple face mask

## 2021-02-23 NOTE — OR Nursing (Signed)
Patient refused to remove bracelet on left hand where the operative site is. This RN informed specialty coordinator Delena Serve. Coordinator came in and explained to patient the risk if bracelet not removed for the procedure. Patient verbalized understanding of risks and still refuses for it to be removed. This RN and coordinator covered the bracelet prior to skin prep to minimize contact to the skin. Surgeon was informed and aware.

## 2021-02-23 NOTE — Op Note (Signed)
    NAME: [name] [name]    MRN: [id number] DOB: Nov 26, 1975    DATE OF OPERATION: 02/23/2021  PREOP DIAGNOSIS:    Poorly maturing left AV fistula  POSTOP DIAGNOSIS:    Same  PROCEDURE:    Superficialization of left brachiocephalic fistula and ligation of multiple competing branches  SURGEON: Judeth Cornfield. Scot Dock, MD  ASSIST: Liana Crocker, PA  ANESTHESIA: Local with sedation  EBL: Minimal  INDICATIONS:    [name] [name] is a 45 y.o. female who had a left brachiocephalic fistula which on follow-up duplex was noted to be too deep.  She presents for superficialzation.  FINDINGS:   Excellent thrill at the completion of the procedure.  TECHNIQUE:   The patient was taken to the operating room and sedated by anesthesia.  The left upper extremity was prepped and draped in usual sterile fashion.  I looked at the vein with the SonoSite and several large branches were identified.  After the skin was anesthetized with 1% lidocaine 3 incisions were made longitudinally over the fistula leaving a skin bridge between the incisions.  The vein was dissected free circumferentially the entire length up to the shoulder.  Branches were divided between clips and 3-0 silk ties.  The vein was fully mobilized.  Adipose tissue overlying the vein was excised using electrocautery.  Hemostasis was obtained in the wounds.  I then closed the deep layer underneath the vein to lift the vein closer to the skin.  This was done with running 3-0 Vicryl.  Each of the 3 incisions was closed with 4-0 Monocryl.  Dermabond was applied.  The patient tolerated procedure well was transferred to recovery room in stable condition.  All needle and sponge counts were correct.  Given the complexity of the case a first assistant was necessary in order to expedient the procedure and safely perform the technical aspects of the operation.  [name], MD, FACS [facility] [place] DICTATION:   02/23/2021

## 2021-02-23 NOTE — Transfer of Care (Signed)
Immediate Anesthesia Transfer of Care Note  Patient: [name] [name]  Procedure(s) Performed: SUPERFICIALIZATION OF LEFT BRACHIOCEPHALIC FISTULA (Left)  Patient Location: PACU  Anesthesia Type:MAC  Level of Consciousness: drowsy, patient cooperative and responds to stimulation  Airway & Oxygen Therapy: Patient Spontanous Breathing and Patient connected to face mask oxygen  Post-op Assessment: Report given to RN and Post -op Vital signs reviewed and stable  Post vital signs: Reviewed and stable  Last Vitals:  Vitals Value Taken Time  BP 144/70 02/23/21 1230  Temp    Pulse 85 02/23/21 1231  Resp 14 02/23/21 1231  SpO2 100 % 02/23/21 1231  Vitals shown include unvalidated device data.  Last Pain:  Vitals:   02/23/21 0927  TempSrc:   PainSc: 0-No pain      Patients Stated Pain Goal: 3 (11/15/92 8546)  Complications: No notable events documented.

## 2021-02-23 NOTE — Discharge Instructions (Signed)
   [facility]  Discharge Instructions  AV Fistula or Graft Surgery for Dialysis Access  Please refer to the following instructions for your post-procedure care. Your surgeon or physician assistant will discuss any changes with you.  Activity  You may drive the day following your surgery, if you are comfortable and no longer taking prescription pain medication. Resume full activity as the soreness in your incision resolves.  Bathing/Showering  You may shower after you go home. Keep your incision dry for 48 hours. Do not soak in a bathtub, hot tub, or swim until the incision heals completely. You may not shower if you have a hemodialysis catheter.  Incision Care  Clean your incision with mild soap and water after 48 hours. Pat the area dry with a clean towel. You do not need a bandage unless otherwise instructed. Do not apply any ointments or creams to your incision. You may have skin glue on your incision. Do not peel it off. It will come off on its own in about one week. Your arm may swell a bit after surgery. To reduce swelling use pillows to elevate your arm so it is above your heart. Your doctor will tell you if you need to lightly wrap your arm with an ACE bandage.  Diet  Resume your normal diet. There are not special food restrictions following this procedure. In order to heal from your surgery, it is CRITICAL to get adequate nutrition. Your body requires vitamins, minerals, and protein. Vegetables are the best source of vitamins and minerals. Vegetables also provide the perfect balance of protein. Processed food has little nutritional value, so try to avoid this.  Medications  Resume taking all of your medications. If your incision is causing pain, you may take over-the counter pain relievers such as acetaminophen (Tylenol). If you were prescribed a stronger pain medication, please be aware these medications can cause nausea and constipation. Prevent  nausea by taking the medication with a snack or meal. Avoid constipation by drinking plenty of fluids and eating foods with high amount of fiber, such as fruits, vegetables, and grains.  Do not take Tylenol if you are taking prescription pain medications.  Follow up Your surgeon may want to see you in the office following your access surgery. If so, this will be arranged at the time of your surgery.  Please call us immediately for any of the following conditions:  Increased pain, redness, drainage (pus) from your incision site Fever of 101 degrees or higher Severe or worsening pain at your incision site Hand pain or numbness.  Reduce your risk of vascular disease:  Stop smoking. If you would like help, call [facility] at 1-800-QUIT-NOW 779-663-1554) or [place] at [place] your cholesterol Maintain a desired weight Control your diabetes Keep your blood pressure down  Dialysis  It will take several weeks to several months for your new dialysis access to be ready for use. Your surgeon will determine when it is okay to use it. Your nephrologist will continue to direct your dialysis. You can continue to use your Permcath until your new access is ready for use.   02/23/2021 [name] [name] [id number] 10-Apr-1976  Surgeon(s): Angelia Mould, MD  Procedure(s): SUPERFICIALIZATION OF LEFT BRACHIOCEPHALIC FISTULA  x Do not stick fistula for 8 weeks    If you have any questions, please call the office at [phone].

## 2021-02-23 NOTE — Anesthesia Postprocedure Evaluation (Signed)
Anesthesia Post Note  Patient: [name] [name]  Procedure(s) Performed: SUPERFICIALIZATION OF LEFT BRACHIOCEPHALIC FISTULA (Left)     Patient location during evaluation: PACU Anesthesia Type: MAC Level of consciousness: awake and alert Pain management: pain level controlled Vital Signs Assessment: post-procedure vital signs reviewed and stable Respiratory status: spontaneous breathing, nonlabored ventilation and respiratory function stable Cardiovascular status: stable and blood pressure returned to baseline Anesthetic complications: no   No notable events documented.  Last Vitals:  Vitals:   02/23/21 1230 02/23/21 1245  BP: (!) 144/70 (!) 161/86  Pulse: 85 88  Resp: 19 13  Temp: (!) 36.2 C 36.7 C  SpO2: 100% 99%    Last Pain:  Vitals:   02/23/21 1245  TempSrc:   PainSc: [place]

## 2021-02-23 NOTE — Anesthesia Preprocedure Evaluation (Addendum)
Anesthesia Evaluation  Patient identified by MRN, date of birth, ID band Patient awake    Reviewed: Allergy & Precautions, NPO status , Patient's Chart, lab work & pertinent test results  History of Anesthesia Complications Negative for: history of anesthetic complications  Airway Mallampati: III  TM Distance: >3 FB Neck ROM: Full    Dental  (+) Dental Advisory Given   Pulmonary sleep apnea , Current Smoker and Patient abstained from smoking.,    Pulmonary exam normal        Cardiovascular hypertension, Pt. on medications and Pt. on home beta blockers Normal cardiovascular exam   '21 TTE - EF 60 to 65%. Left and right atrial size was mildly dilated. Trivial mitral valve regurgitation.     Neuro/Psych PSYCHIATRIC DISORDERS Anxiety Depression Bipolar Disorder negative neurological ROS     GI/Hepatic GERD  ,(+)     substance abuse  marijuana use,   Endo/Other  diabetes, Type 2Hypothyroidism  Obesity   Renal/GU CRFRenal disease     Musculoskeletal  (+) Fibromyalgia -  Abdominal   Peds  Hematology negative hematology ROS (+)   Anesthesia Other Findings HSV   Reproductive/Obstetrics                            Anesthesia Physical Anesthesia Plan  ASA: 3  Anesthesia Plan: MAC   Post-op Pain Management:    Induction:   PONV Risk Score and Plan: 1 and Treatment may vary due to age or medical condition and Propofol infusion  Airway Management Planned: Natural Airway and Simple Face Mask  Additional Equipment: None  Intra-op Plan:   Post-operative Plan:   Informed Consent: I have reviewed the patients History and Physical, chart, labs and discussed the procedure including the risks, benefits and alternatives for the proposed anesthesia with the patient or authorized representative who has indicated his/her understanding and acceptance.       Plan Discussed with: CRNA and  Anesthesiologist  Anesthesia Plan Comments:      Anesthesia Quick Evaluation

## 2021-02-24 ENCOUNTER — Encounter: Payer: Medicare HMO | Admitting: Internal Medicine

## 2021-02-24 ENCOUNTER — Ambulatory Visit (INDEPENDENT_AMBULATORY_CARE_PROVIDER_SITE_OTHER): Payer: Medicare HMO | Admitting: Internal Medicine

## 2021-02-24 ENCOUNTER — Encounter (HOSPITAL_COMMUNITY): Payer: Self-pay | Admitting: Vascular Surgery

## 2021-02-24 VITALS — BP 146/74 | HR 87 | Temp 98.2°F | Wt 270.4 lb

## 2021-02-24 DIAGNOSIS — E1159 Type 2 diabetes mellitus with other circulatory complications: Secondary | ICD-10-CM | POA: Diagnosis not present

## 2021-02-24 DIAGNOSIS — F32A Depression, unspecified: Secondary | ICD-10-CM

## 2021-02-24 DIAGNOSIS — G894 Chronic pain syndrome: Secondary | ICD-10-CM | POA: Diagnosis not present

## 2021-02-24 DIAGNOSIS — L732 Hidradenitis suppurativa: Secondary | ICD-10-CM | POA: Diagnosis not present

## 2021-02-24 DIAGNOSIS — N186 End stage renal disease: Secondary | ICD-10-CM

## 2021-02-24 DIAGNOSIS — Z992 Dependence on renal dialysis: Secondary | ICD-10-CM | POA: Diagnosis not present

## 2021-02-24 DIAGNOSIS — F419 Anxiety disorder, unspecified: Secondary | ICD-10-CM

## 2021-02-24 HISTORY — DX: Hidradenitis suppurativa: L73.2

## 2021-02-24 LAB — POCT GLYCOSYLATED HEMOGLOBIN (HGB A1C): Hemoglobin A1C: 5.4 % (ref 4.0–5.6)

## 2021-02-24 LAB — GLUCOSE, CAPILLARY: Glucose-Capillary: 98 mg/dL (ref 70–99)

## 2021-02-24 MED ORDER — DOXYCYCLINE HYCLATE 100 MG PO CAPS: 100.0000 mg | ORAL_CAPSULE | Freq: Two times a day (BID) | ORAL | 0 refills | Status: AC

## 2021-02-24 MED ORDER — HYDROMORPHONE HCL 4 MG PO TABS
4.0000 mg | ORAL_TABLET | Freq: Four times a day (QID) | ORAL | 0 refills | Status: DC | PRN
Start: 2021-02-24 — End: 2021-02-25

## 2021-02-24 MED ORDER — AMITRIPTYLINE HCL 100 MG PO TABS: 100.0000 mg | ORAL_TABLET | Freq: Every day | ORAL | 0 refills | Status: DC

## 2021-02-24 MED ORDER — HYDROMORPHONE HCL 4 MG PO TABS
4.0000 mg | ORAL_TABLET | Freq: Three times a day (TID) | ORAL | 0 refills | Status: DC | PRN
Start: 2021-02-24 — End: 2021-02-24

## 2021-02-24 NOTE — Assessment & Plan Note (Signed)
HbA1c obtained at this visit and noted to be 5.4. Suspect falsely low in setting of ESRD at this time. POC glucose 98.  Plan: Continue Victoza 1.8mg  qHS

## 2021-02-24 NOTE — Assessment & Plan Note (Signed)
Patient with progressive chronic renal disease was admitted in August 2022 for concerns of pyelonephritis and worsening renal function. During admission, patient initiated on hemodialysis. She has since been compliant with HD on Tues,Thurs, Sat schedule.  Medications have been renally adjusted.   Plan: Continue dialysis on Tues, Thurs, Sat F/u with nephrology Continue Dialyvyte and Renvela

## 2021-02-24 NOTE — Progress Notes (Signed)
   CC: pain medication, boil in right groin  HPI:  [name] [name] is a 45 y.o. female with PMHx as stated below presenting for discussion of her chronic pain medications. She would also like to have a boil on her right groin evaluated today. Please see problem based charting for complete assessment and plan.  Past Medical History:  Diagnosis Date   Abdominal muscle pain 09/08/2016   Abnormal Pap smear of cervix 2009   Abscess    history of multiple abscesses   Acute bilateral low back pain 02/13/2017   Acute blood loss anemia    Acute on chronic renal failure ([place]) 07/12/2012   Acute renal failure ([place]) 07/12/2012   Adjustment disorder with depressed mood 03/26/2017   AKI (acute kidney injury) ([place])    Anemia 10/21/2019   Anemia of chronic disease 2002   Anginal pain (HCC)    Anxiety    Panic attacks   Bilateral lower extremity edema 05/13/2016   Bipolar disorder ([place])    Cataract    B/L cataract   Cellulitis 05/21/2014   right eye   CHF (congestive heart failure) ([place])    Chronic bronchitis ([place])    "get it q yr" (05/13/2013)   Chronic pain    Chronic pain of right knee 09/08/2016   Dehiscence of amputation stump ([place])    Depression    Edema of lower extremity    Endocarditis 2002   subacute bacterial endocarditis.    Family history of anesthesia complication    "my mom has a hard time coming out from under"   Fibromyalgia    GERD (gastroesophageal reflux disease)    occ   Heart murmur    Herpes simplex type 1 infection 01/16/2018   History of blood transfusion    "just low blood count" (05/13/2013)   Hyperlipidemia    Hypertension    Hypothyroidism    Hypothyroidism, adult 03/21/2014   Hypoxia    Leukocytosis    Necrosis ([place])    and ulceration   Necrotizing fasciitis s/p OR debridements 07/06/2012   Obesity    OSA on CPAP    does not wear all the time   Peripheral neuropathy    Pneumonia    Severe protein-calorie malnutrition (HCC)    Type  II diabetes mellitus ([place])    Type  II   Wound dehiscence 09/04/2019   Review of Systems:  Negative except as stated in HPI.  Physical Exam:  Vitals:   02/24/21 1038  BP: (!) 146/74  Pulse: 87  Temp: 98.2 F (36.8 C)  TempSrc: Oral  SpO2: 100%  Weight: 270 lb 6.4 oz (122.7 kg)   Physical Exam  Constitutional: Chronically ill appearing female, no acute distress  Cardiovascular: Normal rate, regular rhythm, S1 and S2 present, no murmurs, rubs, gallops.  Distal pulses intact Respiratory: No respiratory distress, Lungs are clear to auscultation bilaterally. Musculoskeletal: Normal bulk and tone.  S/p R BKA Neurological: Is alert and oriented x4, no apparent focal deficits noted. Skin: Warm and dry.  2cm nodule with tract formation in R groin; fluctuant but nonerythematous, tender to touch; not currently draining; evidence of prior similar nodules with tracts noted in inguinal region Psychiatric: Depressed mood; normal affect and behavior.    Assessment & Plan:   See Encounters Tab for problem based charting.  Patient discussed with Dr. Dareen Piano

## 2021-02-24 NOTE — Assessment & Plan Note (Signed)
Patient reports a boil in her inguinal region for approximately one week. She notes that this is painful but not draining at this time. On examination, 2cm tender fluctuant nodule noted on exam with surrounding tract; no erythema noted in the right inguinal region. Evidence of prior tracts noted in the inguinal region. She reports similar episodes in bilateral axilla previously. Exam consistent with hidradenitis suppurativa.  Plan: Doxycycline 100mg  twice daily Advised for warm compresses

## 2021-02-24 NOTE — Patient Instructions (Addendum)
Ms Emmalee Solivan,  It was a pleasure seeing you in clinic. Today we discussed:   Pain medication:  I will discuss with Dr Hollie Salk regarding safety of going back to the Percocet 5-325mg  1-2 times daily. I will call you to discuss this further.  Groin boil: I am prescribing a course of doxycycline 100mg  twice daily for 5 days. Please use warm compresses on the boil to help with the swelling and drainage.  If you have any questions or concerns, please call our clinic at [phone] between 9am-5pm and after hours call [phone] and ask for the internal medicine resident on call. If you feel you are having a medical emergency please call 911.   Thank you, we look forward to helping you remain healthy!

## 2021-02-24 NOTE — Assessment & Plan Note (Addendum)
Patient has been on Percocet 5-325mg  1-2 times daily for chronic pain syndrome. However, in setting of worsening renal failure and starting hemodialysis, she was recommended to decrease dose by 50%. However, patient notes that her pain is not controlled on current regimen.  I discussed recommendation to switch to oral dilaudid as this is favored in ESRD. Patient initially hesitant; however, on follow up, she is agreeable to trying this. Oral dilaudid equivalent to her Percocet 5-325 1-2x daily prn dosing would be Dilaudid 4mg  every 6h prn  Plan: Dilaudid 4mg  q6h prn #120   ADDENDUM: Dilaudid equivalent of Percocet 10mg  daily is 4mg  once daily. Will adjust dosing. Adjusted dosing of dilaudid 2mg  twice daily prn sent to pharmacy.

## 2021-02-24 NOTE — Assessment & Plan Note (Signed)
Patient endorses worsening depression in setting of starting hemodialysis. She is on wellbutrin and amitriptyline. She is requesting refill on amitriptyline.  Discussed following up with Dr Theodis Shove for which she is agreeable.  Plan: F/u with Dr Theodis Shove Continue wellbutrin 150mg  daily and amitriptyline 100mg  qHS

## 2021-02-25 ENCOUNTER — Encounter: Payer: Medicare HMO | Admitting: Internal Medicine

## 2021-02-25 ENCOUNTER — Telehealth: Payer: Self-pay | Admitting: Behavioral Health

## 2021-02-25 DIAGNOSIS — Z992 Dependence on renal dialysis: Secondary | ICD-10-CM | POA: Diagnosis not present

## 2021-02-25 DIAGNOSIS — N186 End stage renal disease: Secondary | ICD-10-CM | POA: Diagnosis not present

## 2021-02-25 DIAGNOSIS — N2581 Secondary hyperparathyroidism of renal origin: Secondary | ICD-10-CM | POA: Diagnosis not present

## 2021-02-25 DIAGNOSIS — Z7689 Persons encountering health services in other specified circumstances: Secondary | ICD-10-CM | POA: Diagnosis not present

## 2021-02-25 MED ORDER — HYDROMORPHONE HCL 2 MG PO TABS: 4.0000 mg | ORAL_TABLET | Freq: Two times a day (BID) | ORAL | 0 refills | Status: DC | PRN

## 2021-02-25 NOTE — Telephone Encounter (Signed)
Contacted Pt per Dr. Margy Clarks request. Pt is now on HD. This adjustment has been difficult. She is on HD for T/Th/Sat - 4 hr ea session. Pt agrees to future appt to discuss her transition. Pt has also undergone surgery on her arm this week & she is in pain.  Pt requests appt on M or W @ 9:00am.  Dr. Theodis Shove

## 2021-02-25 NOTE — Addendum Note (Signed)
Addended by: Harvie Heck on: 02/25/2021 05:16 PM   Modules accepted: Orders

## 2021-02-26 DIAGNOSIS — Z89512 Acquired absence of left leg below knee: Secondary | ICD-10-CM | POA: Diagnosis not present

## 2021-02-26 NOTE — Progress Notes (Signed)
Internal Medicine Clinic Attending ° °Case discussed with Dr. Aslam  At the time of the visit.  We reviewed the resident’s history and exam and pertinent patient test results.  I agree with the assessment, diagnosis, and plan of care documented in the resident’s note.  °

## 2021-02-27 DIAGNOSIS — N186 End stage renal disease: Secondary | ICD-10-CM | POA: Diagnosis not present

## 2021-02-27 DIAGNOSIS — Z992 Dependence on renal dialysis: Secondary | ICD-10-CM | POA: Diagnosis not present

## 2021-02-27 DIAGNOSIS — N2581 Secondary hyperparathyroidism of renal origin: Secondary | ICD-10-CM | POA: Diagnosis not present

## 2021-02-27 DIAGNOSIS — Z7689 Persons encountering health services in other specified circumstances: Secondary | ICD-10-CM | POA: Diagnosis not present

## 2021-03-01 ENCOUNTER — Other Ambulatory Visit: Payer: Self-pay | Admitting: Internal Medicine

## 2021-03-01 DIAGNOSIS — E785 Hyperlipidemia, unspecified: Secondary | ICD-10-CM

## 2021-03-02 DIAGNOSIS — N2581 Secondary hyperparathyroidism of renal origin: Secondary | ICD-10-CM | POA: Diagnosis not present

## 2021-03-02 DIAGNOSIS — N186 End stage renal disease: Secondary | ICD-10-CM | POA: Diagnosis not present

## 2021-03-02 DIAGNOSIS — Z992 Dependence on renal dialysis: Secondary | ICD-10-CM | POA: Diagnosis not present

## 2021-03-02 DIAGNOSIS — Z7689 Persons encountering health services in other specified circumstances: Secondary | ICD-10-CM | POA: Diagnosis not present

## 2021-03-04 DIAGNOSIS — N2581 Secondary hyperparathyroidism of renal origin: Secondary | ICD-10-CM | POA: Diagnosis not present

## 2021-03-04 DIAGNOSIS — N186 End stage renal disease: Secondary | ICD-10-CM | POA: Diagnosis not present

## 2021-03-04 DIAGNOSIS — Z7689 Persons encountering health services in other specified circumstances: Secondary | ICD-10-CM | POA: Diagnosis not present

## 2021-03-04 DIAGNOSIS — Z992 Dependence on renal dialysis: Secondary | ICD-10-CM | POA: Diagnosis not present

## 2021-03-06 DIAGNOSIS — N2581 Secondary hyperparathyroidism of renal origin: Secondary | ICD-10-CM | POA: Diagnosis not present

## 2021-03-06 DIAGNOSIS — Z7689 Persons encountering health services in other specified circumstances: Secondary | ICD-10-CM | POA: Diagnosis not present

## 2021-03-06 DIAGNOSIS — Z992 Dependence on renal dialysis: Secondary | ICD-10-CM | POA: Diagnosis not present

## 2021-03-06 DIAGNOSIS — N186 End stage renal disease: Secondary | ICD-10-CM | POA: Diagnosis not present

## 2021-03-09 DIAGNOSIS — N2581 Secondary hyperparathyroidism of renal origin: Secondary | ICD-10-CM | POA: Diagnosis not present

## 2021-03-09 DIAGNOSIS — N186 End stage renal disease: Secondary | ICD-10-CM | POA: Diagnosis not present

## 2021-03-09 DIAGNOSIS — Z992 Dependence on renal dialysis: Secondary | ICD-10-CM | POA: Diagnosis not present

## 2021-03-09 DIAGNOSIS — Z7689 Persons encountering health services in other specified circumstances: Secondary | ICD-10-CM | POA: Diagnosis not present

## 2021-03-11 ENCOUNTER — Telehealth: Payer: Self-pay | Admitting: *Deleted

## 2021-03-11 DIAGNOSIS — N2581 Secondary hyperparathyroidism of renal origin: Secondary | ICD-10-CM | POA: Diagnosis not present

## 2021-03-11 DIAGNOSIS — Z7689 Persons encountering health services in other specified circumstances: Secondary | ICD-10-CM | POA: Diagnosis not present

## 2021-03-11 DIAGNOSIS — Z992 Dependence on renal dialysis: Secondary | ICD-10-CM | POA: Diagnosis not present

## 2021-03-11 DIAGNOSIS — N186 End stage renal disease: Secondary | ICD-10-CM | POA: Diagnosis not present

## 2021-03-11 NOTE — Telephone Encounter (Signed)
Patient called in stating she has tried the dilaudid and it is not working for her. She would like to go back to percocet. She is also requesting a cal back from Dr. Marva Panda.

## 2021-03-12 ENCOUNTER — Other Ambulatory Visit: Payer: Self-pay | Admitting: Student

## 2021-03-12 MED ORDER — OXYCODONE-ACETAMINOPHEN 2.5-325 MG PO TABS: 1.0000 | ORAL_TABLET | Freq: Two times a day (BID) | ORAL | 0 refills | Status: DC | PRN

## 2021-03-12 MED ORDER — OXYCODONE-ACETAMINOPHEN 5-325 MG PO TABS: 0.5000 | ORAL_TABLET | Freq: Two times a day (BID) | ORAL | 0 refills | Status: DC | PRN

## 2021-03-12 NOTE — Telephone Encounter (Signed)
Returned call to patient. She reports that she has had no relief at all with current dose of dilaudid and is requesting to return to percocet. I discussed that given her ESRD status, dilaudid is the preferred pain management regimen and offered to increase her dilaudid dosing if her pain is not controlled. She is requesting to go back to Percocet. I advised that if she was prescribed percocet, it would remain at the reduced dosing of Percocet 2.5-325mg  1-2 times daily prn. Patient expresses understanding but would like to go back to percocet as she states it provided more relief than her current dilaudid dosing. Does not wish to increase to q8h prn of dilaudid at this time.  She is also endorsing right knee pain; reports that she usually gets steroid injections in the knee with Dr Sharol Given and also notes that she needs to go back in to see physical therapist. Previously evaluated by Dr Sharol Given in April 2022. Referral to PT placed by Dr Jess Barters office 01/18/21.  Patient to follow up with PCP on 03/31/2021

## 2021-03-13 DIAGNOSIS — Z992 Dependence on renal dialysis: Secondary | ICD-10-CM | POA: Diagnosis not present

## 2021-03-13 DIAGNOSIS — Z7689 Persons encountering health services in other specified circumstances: Secondary | ICD-10-CM | POA: Diagnosis not present

## 2021-03-13 DIAGNOSIS — N186 End stage renal disease: Secondary | ICD-10-CM | POA: Diagnosis not present

## 2021-03-13 DIAGNOSIS — N2581 Secondary hyperparathyroidism of renal origin: Secondary | ICD-10-CM | POA: Diagnosis not present

## 2021-03-16 DIAGNOSIS — Z992 Dependence on renal dialysis: Secondary | ICD-10-CM | POA: Diagnosis not present

## 2021-03-16 DIAGNOSIS — Z7689 Persons encountering health services in other specified circumstances: Secondary | ICD-10-CM | POA: Diagnosis not present

## 2021-03-16 DIAGNOSIS — N2581 Secondary hyperparathyroidism of renal origin: Secondary | ICD-10-CM | POA: Diagnosis not present

## 2021-03-16 DIAGNOSIS — N186 End stage renal disease: Secondary | ICD-10-CM | POA: Diagnosis not present

## 2021-03-17 ENCOUNTER — Ambulatory Visit: Payer: Medicare HMO | Admitting: Behavioral Health

## 2021-03-17 DIAGNOSIS — G894 Chronic pain syndrome: Secondary | ICD-10-CM

## 2021-03-17 DIAGNOSIS — Z89612 Acquired absence of left leg above knee: Secondary | ICD-10-CM

## 2021-03-17 DIAGNOSIS — F419 Anxiety disorder, unspecified: Secondary | ICD-10-CM

## 2021-03-17 DIAGNOSIS — F331 Major depressive disorder, recurrent, moderate: Secondary | ICD-10-CM

## 2021-03-17 DIAGNOSIS — G546 Phantom limb syndrome with pain: Secondary | ICD-10-CM

## 2021-03-17 NOTE — BH Specialist Note (Signed)
Integrated Behavioral Health via Telemedicine Visit  03/17/2021 [name] [name] [id number]  Number of [place] visits: 4/6 Session Start time: 9:00am  Session End time: 9:40 Total time: 40   Referring Provider: Dr. Marlane Mingle, DO Patient/Family location: Pt is home in private; Pt does not currently work due to Disability [facility] Provider location: [facility] Office All persons participating in visit: Pt & Clinician Types of Service: Individual psychotherapy  I connected with [name] and/or [name] [name]'s  self  via  Telephone or Video Enabled Telemedicine Application  (Video is Caregility application) and verified that I am speaking with the correct person using two identifiers. Discussed confidentiality:  4th visit  I discussed the limitations of telemedicine and the availability of in person appointments.  Discussed there is a possibility of technology failure and discussed alternative modes of communication if that failure occurs.  I discussed that engaging in this telemedicine visit, they consent to the provision of behavioral healthcare and the services will be billed under their insurance.  Patient and/or legal guardian expressed understanding and consented to Telemedicine visit:  4th visit  Presenting Concerns: Patient and/or family reports the following symptoms/concerns: pain issues w/greater freq & number of days, not the amt of time ea day- even post AKA; Pt expresses phantom limb syndrome, restless leg syndrome, & neuropathy Duration of problem: months (pain started >10yr ago prior to her amputation Severity of problem: moderate w/Pt tolerance for functioning & coping as a dep'd person, not acting/being, "woe is me".  Patient and/or Family's Strengths/Protective Factors: Social connections, Social and Patent attorney, Concrete supports in place (healthy food, safe environments, etc.), Sense of purpose, and Caregiver has knowledge  of parenting & child development  Goals Addressed: Patient will:  Reduce symptoms of: anxiety, depression, and stress   Increase knowledge and/or ability of: coping skills, stress reduction, and psychological pain control skills    Demonstrate ability to: Increase healthy adjustment to current life circumstances  Progress towards Goals: Ongoing  Interventions: Interventions utilized:  Supportive Counseling and psychological pain control skills Standardized Assessments completed:  screeners prn  Patient and/or Family Response: Pt receptive to call today & requests future appt  Assessment: Patient currently experiencing inc'd adeptness w/her self-advocacy as a Mother of [name] [name]. Pt has lived out being her Dtr's Guardian even though Bio-Mother is still living & per Pt report unable to care for 44yo.  Dtr is sensitive to Pt's pain & knows when to give her space or, "she gets in my face!" Dtr's Bio-Mother is Pt's cousin. Pt does not talk negatively about her, but she realizes how Pt feels towards her cousin.  Pt has positive coping skills in place to help her relax. She runs a fan for white noise, she does arts & crafts, she paces herself & the tasks she needs to accomplish daily, & she often cannot rely on the Caregiver who assists her. She is realistic & uses her prostetic & her walker.  Pt has Hx of panic attacks which have debilitated her to the point of hosp'tzn.  Patient may benefit from progressive musc-relaxation, mini-relaxation practices, pleasant imagery.  Plan: Follow up with behavioral health clinician on : 2-3 wks on telehealth for 30 min ck-in Behavioral recommendations: download calm.com, look for guided meditations online, use breathing skills daily to combat pain-[name] [name] for [employer].is, use Grounding Techniques HO mailed by Clinician, go on [email or web address] & explore the meditation offerings-they are free Referral(s): [place] (In  Clinic)  I  discussed the assessment and treatment plan with the patient and/or parent/guardian. They were provided an opportunity to ask questions and all were answered. They agreed with the plan and demonstrated an understanding of the instructions.   They were advised to call back or seek an in-person evaluation if the symptoms worsen or if the condition fails to improve as anticipated.  [name] Hutching, LMFT

## 2021-03-18 DIAGNOSIS — Z992 Dependence on renal dialysis: Secondary | ICD-10-CM | POA: Diagnosis not present

## 2021-03-18 DIAGNOSIS — Z7689 Persons encountering health services in other specified circumstances: Secondary | ICD-10-CM | POA: Diagnosis not present

## 2021-03-18 DIAGNOSIS — N186 End stage renal disease: Secondary | ICD-10-CM | POA: Diagnosis not present

## 2021-03-18 DIAGNOSIS — N2581 Secondary hyperparathyroidism of renal origin: Secondary | ICD-10-CM | POA: Diagnosis not present

## 2021-03-20 DIAGNOSIS — Z992 Dependence on renal dialysis: Secondary | ICD-10-CM | POA: Diagnosis not present

## 2021-03-20 DIAGNOSIS — Z7689 Persons encountering health services in other specified circumstances: Secondary | ICD-10-CM | POA: Diagnosis not present

## 2021-03-20 DIAGNOSIS — N2581 Secondary hyperparathyroidism of renal origin: Secondary | ICD-10-CM | POA: Diagnosis not present

## 2021-03-20 DIAGNOSIS — N186 End stage renal disease: Secondary | ICD-10-CM | POA: Diagnosis not present

## 2021-03-22 ENCOUNTER — Other Ambulatory Visit: Payer: Self-pay | Admitting: Internal Medicine

## 2021-03-22 DIAGNOSIS — N186 End stage renal disease: Secondary | ICD-10-CM | POA: Diagnosis not present

## 2021-03-22 DIAGNOSIS — F32A Depression, unspecified: Secondary | ICD-10-CM

## 2021-03-22 DIAGNOSIS — Z992 Dependence on renal dialysis: Secondary | ICD-10-CM | POA: Diagnosis not present

## 2021-03-22 DIAGNOSIS — F419 Anxiety disorder, unspecified: Secondary | ICD-10-CM

## 2021-03-22 DIAGNOSIS — E1122 Type 2 diabetes mellitus with diabetic chronic kidney disease: Secondary | ICD-10-CM | POA: Diagnosis not present

## 2021-03-23 DIAGNOSIS — Z992 Dependence on renal dialysis: Secondary | ICD-10-CM | POA: Diagnosis not present

## 2021-03-23 DIAGNOSIS — N186 End stage renal disease: Secondary | ICD-10-CM | POA: Diagnosis not present

## 2021-03-23 DIAGNOSIS — N2581 Secondary hyperparathyroidism of renal origin: Secondary | ICD-10-CM | POA: Diagnosis not present

## 2021-03-23 DIAGNOSIS — Z7689 Persons encountering health services in other specified circumstances: Secondary | ICD-10-CM | POA: Diagnosis not present

## 2021-03-25 ENCOUNTER — Encounter: Payer: Medicare HMO | Admitting: Vascular Surgery

## 2021-03-25 ENCOUNTER — Encounter (HOSPITAL_COMMUNITY): Payer: Medicare HMO

## 2021-03-25 DIAGNOSIS — N2581 Secondary hyperparathyroidism of renal origin: Secondary | ICD-10-CM | POA: Diagnosis not present

## 2021-03-25 DIAGNOSIS — N186 End stage renal disease: Secondary | ICD-10-CM | POA: Diagnosis not present

## 2021-03-25 DIAGNOSIS — Z7689 Persons encountering health services in other specified circumstances: Secondary | ICD-10-CM | POA: Diagnosis not present

## 2021-03-25 DIAGNOSIS — Z992 Dependence on renal dialysis: Secondary | ICD-10-CM | POA: Diagnosis not present

## 2021-03-27 DIAGNOSIS — Z7689 Persons encountering health services in other specified circumstances: Secondary | ICD-10-CM | POA: Diagnosis not present

## 2021-03-27 DIAGNOSIS — N2581 Secondary hyperparathyroidism of renal origin: Secondary | ICD-10-CM | POA: Diagnosis not present

## 2021-03-27 DIAGNOSIS — N186 End stage renal disease: Secondary | ICD-10-CM | POA: Diagnosis not present

## 2021-03-27 DIAGNOSIS — Z992 Dependence on renal dialysis: Secondary | ICD-10-CM | POA: Diagnosis not present

## 2021-03-29 DIAGNOSIS — Z89512 Acquired absence of left leg below knee: Secondary | ICD-10-CM | POA: Diagnosis not present

## 2021-03-30 ENCOUNTER — Encounter: Payer: Self-pay | Admitting: *Deleted

## 2021-03-30 DIAGNOSIS — Z7689 Persons encountering health services in other specified circumstances: Secondary | ICD-10-CM | POA: Diagnosis not present

## 2021-03-30 DIAGNOSIS — N186 End stage renal disease: Secondary | ICD-10-CM | POA: Diagnosis not present

## 2021-03-30 DIAGNOSIS — N2581 Secondary hyperparathyroidism of renal origin: Secondary | ICD-10-CM | POA: Diagnosis not present

## 2021-03-30 DIAGNOSIS — Z992 Dependence on renal dialysis: Secondary | ICD-10-CM | POA: Diagnosis not present

## 2021-03-30 NOTE — Progress Notes (Unsigned)

## 2021-03-31 ENCOUNTER — Encounter: Payer: Medicare HMO | Admitting: Student

## 2021-04-01 DIAGNOSIS — N186 End stage renal disease: Secondary | ICD-10-CM | POA: Diagnosis not present

## 2021-04-01 DIAGNOSIS — Z992 Dependence on renal dialysis: Secondary | ICD-10-CM | POA: Diagnosis not present

## 2021-04-01 DIAGNOSIS — Z7689 Persons encountering health services in other specified circumstances: Secondary | ICD-10-CM | POA: Diagnosis not present

## 2021-04-01 DIAGNOSIS — N2581 Secondary hyperparathyroidism of renal origin: Secondary | ICD-10-CM | POA: Diagnosis not present

## 2021-04-03 ENCOUNTER — Other Ambulatory Visit: Payer: Self-pay

## 2021-04-03 DIAGNOSIS — N186 End stage renal disease: Secondary | ICD-10-CM | POA: Diagnosis not present

## 2021-04-03 DIAGNOSIS — Z7689 Persons encountering health services in other specified circumstances: Secondary | ICD-10-CM | POA: Diagnosis not present

## 2021-04-03 DIAGNOSIS — Z992 Dependence on renal dialysis: Secondary | ICD-10-CM | POA: Diagnosis not present

## 2021-04-03 DIAGNOSIS — N2581 Secondary hyperparathyroidism of renal origin: Secondary | ICD-10-CM | POA: Diagnosis not present

## 2021-04-05 ENCOUNTER — Other Ambulatory Visit: Payer: Self-pay | Admitting: Student

## 2021-04-06 DIAGNOSIS — Z7689 Persons encountering health services in other specified circumstances: Secondary | ICD-10-CM | POA: Diagnosis not present

## 2021-04-06 DIAGNOSIS — N186 End stage renal disease: Secondary | ICD-10-CM | POA: Diagnosis not present

## 2021-04-06 DIAGNOSIS — Z992 Dependence on renal dialysis: Secondary | ICD-10-CM | POA: Diagnosis not present

## 2021-04-06 DIAGNOSIS — N2581 Secondary hyperparathyroidism of renal origin: Secondary | ICD-10-CM | POA: Diagnosis not present

## 2021-04-06 NOTE — Telephone Encounter (Signed)
Will deny refill request at this time. Patient needs appointment to discuss multiple medication changes since being on dialysis. Will check last [place] [facility] notes to see if they wanted to continue patient on this medicine.

## 2021-04-07 ENCOUNTER — Ambulatory Visit: Payer: Medicare HMO | Admitting: Behavioral Health

## 2021-04-08 DIAGNOSIS — N2581 Secondary hyperparathyroidism of renal origin: Secondary | ICD-10-CM | POA: Diagnosis not present

## 2021-04-08 DIAGNOSIS — Z992 Dependence on renal dialysis: Secondary | ICD-10-CM | POA: Diagnosis not present

## 2021-04-08 DIAGNOSIS — Z7689 Persons encountering health services in other specified circumstances: Secondary | ICD-10-CM | POA: Diagnosis not present

## 2021-04-08 DIAGNOSIS — N186 End stage renal disease: Secondary | ICD-10-CM | POA: Diagnosis not present

## 2021-04-09 DIAGNOSIS — Z7689 Persons encountering health services in other specified circumstances: Secondary | ICD-10-CM | POA: Diagnosis not present

## 2021-04-09 DIAGNOSIS — Z992 Dependence on renal dialysis: Secondary | ICD-10-CM | POA: Diagnosis not present

## 2021-04-09 DIAGNOSIS — N2581 Secondary hyperparathyroidism of renal origin: Secondary | ICD-10-CM | POA: Diagnosis not present

## 2021-04-09 DIAGNOSIS — N186 End stage renal disease: Secondary | ICD-10-CM | POA: Diagnosis not present

## 2021-04-10 DIAGNOSIS — Z7689 Persons encountering health services in other specified circumstances: Secondary | ICD-10-CM | POA: Diagnosis not present

## 2021-04-13 DIAGNOSIS — N2581 Secondary hyperparathyroidism of renal origin: Secondary | ICD-10-CM | POA: Diagnosis not present

## 2021-04-13 DIAGNOSIS — Z992 Dependence on renal dialysis: Secondary | ICD-10-CM | POA: Diagnosis not present

## 2021-04-13 DIAGNOSIS — N186 End stage renal disease: Secondary | ICD-10-CM | POA: Diagnosis not present

## 2021-04-16 DIAGNOSIS — Z992 Dependence on renal dialysis: Secondary | ICD-10-CM | POA: Diagnosis not present

## 2021-04-16 DIAGNOSIS — N186 End stage renal disease: Secondary | ICD-10-CM | POA: Diagnosis not present

## 2021-04-16 DIAGNOSIS — N2581 Secondary hyperparathyroidism of renal origin: Secondary | ICD-10-CM | POA: Diagnosis not present

## 2021-04-18 DIAGNOSIS — N186 End stage renal disease: Secondary | ICD-10-CM | POA: Diagnosis not present

## 2021-04-18 DIAGNOSIS — N2581 Secondary hyperparathyroidism of renal origin: Secondary | ICD-10-CM | POA: Diagnosis not present

## 2021-04-18 DIAGNOSIS — Z992 Dependence on renal dialysis: Secondary | ICD-10-CM | POA: Diagnosis not present

## 2021-04-20 DIAGNOSIS — Z7689 Persons encountering health services in other specified circumstances: Secondary | ICD-10-CM | POA: Diagnosis not present

## 2021-04-20 DIAGNOSIS — N2581 Secondary hyperparathyroidism of renal origin: Secondary | ICD-10-CM | POA: Diagnosis not present

## 2021-04-20 DIAGNOSIS — Z992 Dependence on renal dialysis: Secondary | ICD-10-CM | POA: Diagnosis not present

## 2021-04-20 DIAGNOSIS — N186 End stage renal disease: Secondary | ICD-10-CM | POA: Diagnosis not present

## 2021-04-21 ENCOUNTER — Other Ambulatory Visit: Payer: Self-pay | Admitting: Student

## 2021-04-21 DIAGNOSIS — N186 End stage renal disease: Secondary | ICD-10-CM | POA: Diagnosis not present

## 2021-04-21 DIAGNOSIS — E1122 Type 2 diabetes mellitus with diabetic chronic kidney disease: Secondary | ICD-10-CM | POA: Diagnosis not present

## 2021-04-21 DIAGNOSIS — Z992 Dependence on renal dialysis: Secondary | ICD-10-CM | POA: Diagnosis not present

## 2021-04-21 MED ORDER — OXYCODONE-ACETAMINOPHEN 5-325 MG PO TABS: 0.5000 | ORAL_TABLET | Freq: Two times a day (BID) | ORAL | 0 refills | Status: DC | PRN

## 2021-04-21 NOTE — Telephone Encounter (Signed)
Oxycodone is not oncurrent med list. Talked to pt who stated her pain has not changed and she needs her pain medication. She does not want to schedule an appt at this time but she wants to talk to her doctor.

## 2021-04-22 ENCOUNTER — Other Ambulatory Visit: Payer: Self-pay | Admitting: Internal Medicine

## 2021-04-22 ENCOUNTER — Other Ambulatory Visit: Payer: Self-pay | Admitting: Student

## 2021-04-22 ENCOUNTER — Telehealth: Payer: Medicare HMO | Admitting: Internal Medicine

## 2021-04-22 DIAGNOSIS — N2581 Secondary hyperparathyroidism of renal origin: Secondary | ICD-10-CM | POA: Diagnosis not present

## 2021-04-22 DIAGNOSIS — Z7689 Persons encountering health services in other specified circumstances: Secondary | ICD-10-CM | POA: Diagnosis not present

## 2021-04-22 DIAGNOSIS — Z992 Dependence on renal dialysis: Secondary | ICD-10-CM | POA: Diagnosis not present

## 2021-04-22 DIAGNOSIS — F32A Depression, unspecified: Secondary | ICD-10-CM

## 2021-04-22 DIAGNOSIS — N186 End stage renal disease: Secondary | ICD-10-CM | POA: Diagnosis not present

## 2021-04-22 NOTE — Telephone Encounter (Signed)
Can we please call [name] [name] and have her scheduled for a follow up appointment in our clinic to discuss how she is doing now that she is on dialysis and her medications moving forward? She should no longer be on the amitriptyline due to her ESRD and needs to  be titrated off of this medicine. She will need to come in to discuss alternative pain management options now that she is on dialysis.   Thank you,  Dr. Johnney Ou

## 2021-04-22 NOTE — Progress Notes (Signed)
Patients oxycodone-acetaminophen was refilled yesterday by Dr.Katsadouros. Called and notified patient. Also asked her to call and schedule follow up with Dr.Katsadouros ( see his note from 11/15).

## 2021-04-24 DIAGNOSIS — N186 End stage renal disease: Secondary | ICD-10-CM | POA: Diagnosis not present

## 2021-04-24 DIAGNOSIS — Z992 Dependence on renal dialysis: Secondary | ICD-10-CM | POA: Diagnosis not present

## 2021-04-24 DIAGNOSIS — N2581 Secondary hyperparathyroidism of renal origin: Secondary | ICD-10-CM | POA: Diagnosis not present

## 2021-04-24 DIAGNOSIS — Z7689 Persons encountering health services in other specified circumstances: Secondary | ICD-10-CM | POA: Diagnosis not present

## 2021-04-27 DIAGNOSIS — N186 End stage renal disease: Secondary | ICD-10-CM | POA: Diagnosis not present

## 2021-04-27 DIAGNOSIS — N2581 Secondary hyperparathyroidism of renal origin: Secondary | ICD-10-CM | POA: Diagnosis not present

## 2021-04-27 DIAGNOSIS — Z7689 Persons encountering health services in other specified circumstances: Secondary | ICD-10-CM | POA: Diagnosis not present

## 2021-04-27 DIAGNOSIS — Z992 Dependence on renal dialysis: Secondary | ICD-10-CM | POA: Diagnosis not present

## 2021-04-29 ENCOUNTER — Encounter: Payer: Medicare HMO | Admitting: Vascular Surgery

## 2021-04-29 ENCOUNTER — Encounter (HOSPITAL_COMMUNITY): Payer: Medicare HMO

## 2021-04-29 DIAGNOSIS — N186 End stage renal disease: Secondary | ICD-10-CM | POA: Diagnosis not present

## 2021-04-29 DIAGNOSIS — Z992 Dependence on renal dialysis: Secondary | ICD-10-CM | POA: Diagnosis not present

## 2021-04-29 DIAGNOSIS — Z7689 Persons encountering health services in other specified circumstances: Secondary | ICD-10-CM | POA: Diagnosis not present

## 2021-04-29 DIAGNOSIS — N2581 Secondary hyperparathyroidism of renal origin: Secondary | ICD-10-CM | POA: Diagnosis not present

## 2021-05-01 DIAGNOSIS — N186 End stage renal disease: Secondary | ICD-10-CM | POA: Diagnosis not present

## 2021-05-01 DIAGNOSIS — Z7689 Persons encountering health services in other specified circumstances: Secondary | ICD-10-CM | POA: Diagnosis not present

## 2021-05-01 DIAGNOSIS — Z992 Dependence on renal dialysis: Secondary | ICD-10-CM | POA: Diagnosis not present

## 2021-05-01 DIAGNOSIS — N2581 Secondary hyperparathyroidism of renal origin: Secondary | ICD-10-CM | POA: Diagnosis not present

## 2021-05-03 NOTE — Progress Notes (Signed)
POST OPERATIVE OFFICE NOTE    CC:  F/u for surgery  HPI:  This is a 45 y.o. female who is s/p superficialization of left BC AVF and ligation of multiple competing branches on 02/23/2021 by Dr. Scot Dock. She had original left BC AVF created on 01/05/2021 as well as TDC placement also by Dr. Scot Dock.    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 Sharol Given for infection.    Pt states she does  have some numbness in left hand due to neuropathy but this has not worsened since her fistula creation.  She states that she is ready to get her catheter out as she has not been able to wear a bra or go to water therapy for her leg.    The pt is on dialysis T/T/S at [facility] location.  She is right hand dominant.    Allergies  Allergen Reactions   Vancomycin Other (See Comments)    Acute renal failure suspected secondary to vanco    Current Outpatient Medications  Medication Sig Dispense Refill   allopurinol (ZYLOPRIM) 100 MG tablet Take 1 tablet (100 mg total) by mouth 3 (three) times a week. Take only after dialysis sessions. Do not take on non-dialysis days. 30 tablet 1   amitriptyline (ELAVIL) 25 MG tablet take 4 tabs by mouth at bedtime for 7 days then 2 tabs at bedtime for 7 days then 1 tab at bedtime for 7 days 49 tablet 0   amLODipine (NORVASC) 10 MG tablet Take 1 tablet (10 mg total) by mouth daily.     atorvastatin (LIPITOR) 20 MG tablet TAKE 1 TABLET BY MOUTH ONCE DAILY 90 tablet 1   B Complex-C-Folic Acid (DIALYVITE 638) 0.8 MG TABS Take 0.8 mg by mouth daily.     bismuth subsalicylate (PEPTO BISMOL) 262 MG/15ML suspension Take 30 mLs by mouth every 6 (six) hours as needed for indigestion or diarrhea or loose stools.     blood glucose meter kit and supplies KIT ICD 10- E11.69. Based on patient and insurance preference. Use up to four times daily as directed. 1 each 0   buPROPion (WELLBUTRIN XL) 150 MG 24 hr tablet Take 1 tablet (150 mg total) by mouth every morning. 90 tablet 3    calcium acetate (PHOSLO) 667 MG capsule Take 1 capsule (667 mg total) by mouth 3 (three) times daily with meals. (Patient not taking: No sig reported) 90 capsule 1   diclofenac Sodium (VOLTAREN) 1 % GEL Apply 1 application topically 4 (four) times daily as needed (pain). 100 g 2   levothyroxine (SYNTHROID) 88 MCG tablet Take 88 mcg by mouth daily.     lisinopril (ZESTRIL) 10 MG tablet Take 10 mg by mouth daily.     metoprolol succinate (TOPROL-XL) 25 MG 24 hr tablet TAKE 1 TABLET BY MOUTH EVERY DAY 90 tablet 1   oxyCODONE-acetaminophen (PERCOCET) 5-325 MG tablet Take 0.5 tablets by mouth 2 (two) times daily as needed for severe pain. 30 tablet 0   sevelamer carbonate (RENVELA) 800 MG tablet Take 800 mg by mouth 3 (three) times daily with meals.     TRUEPLUS PEN NEEDLES 31G X 5 MM MISC Use pen needle with insulin 3 times daily (Patient taking differently: 1 each by Other route 3 (three) times daily.) 100 each 5   VICTOZA 18 MG/3ML SOPN Inject 1.8 mg into the skin at bedtime. 27 mL 1   No current facility-administered medications for this visit.  ROS:  See HPI  Physical Exam:  Today's Vitals   05/07/21 1138  BP: (!) 171/92  Pulse: 60  Resp: 20  Temp: 97.8 F (36.6 C)  TempSrc: Temporal  SpO2: 99%  Height: 5' 9.5" (1.765 m)   Body mass index is 39.36 kg/m.   Incision:  all have healed nicely  Extremities:   There is a palpable left radial pulse.   Motor and sensory are in tact.   There is a thrill present.  The fistula is easily palpable but is somewhat pulsatile proximally   Dialysis Duplex on 05/03/2021: Findings:  +--------------------+----------+-----------------+--------+  AVF                 PSV (cm/s)Flow Vol (mL/min)Comments  +--------------------+----------+-----------------+--------+  Native artery inflow   114           774                 +--------------------+----------+-----------------+--------+  AVF Anastomosis        127                                +--------------------+----------+-----------------+--------+  +------------+----------+-------------+----------+--------+  OUTFLOW VEINPSV (cm/s)Diameter (cm)Depth (cm)Describe  +------------+----------+-------------+----------+--------+  Prox UA        640        0.47        0.30   stenotic  +------------+----------+-------------+----------+--------+  Mid UA         366        0.42        0.29   stenotic  +------------+----------+-------------+----------+--------+  Dist UA         96        0.64        0.31             +------------+----------+-------------+----------+--------+  AC Fossa       220        1.32        0.24             +------------+----------+-------------+----------+--------+     Summary:  Arteriovenous fistula-Stenosis noted.  Elevated velocities in the mid and proximal upper arm segments of the  cephalic vein.    Assessment/Plan:  This is a 45 y.o. female who is s/p: superficialization of left BC AVF and ligation of multiple competing branches on 02/23/2021 by Dr. Scot Dock. She had original left BC AVF created on 01/05/2021 as well as TDC placement also by Dr. Scot Dock.     -the pt does not have evidence of steal. -the fistula is not maturing in the mid and upper arm despite ligation of branches and superficialization.  I discussed doing a fistulogram to evaluate her fistula and possible intervention.   -discussed with pt that access does not last forever and will need intervention or even new access at some point.  I did discuss with her that there is a chance that this fistula does not mature and she would need new access but we would like to try to get this up and working for her.  She is in agreement.  -we will schedule this for a non dialysis day when we have availability   Leontine Locket, [facility] [phone]  Clinic MD:  Virl Cagey

## 2021-05-04 ENCOUNTER — Ambulatory Visit: Payer: Medicare HMO | Admitting: Behavioral Health

## 2021-05-04 DIAGNOSIS — N2581 Secondary hyperparathyroidism of renal origin: Secondary | ICD-10-CM | POA: Diagnosis not present

## 2021-05-04 DIAGNOSIS — Z992 Dependence on renal dialysis: Secondary | ICD-10-CM | POA: Diagnosis not present

## 2021-05-04 DIAGNOSIS — Z7689 Persons encountering health services in other specified circumstances: Secondary | ICD-10-CM | POA: Diagnosis not present

## 2021-05-04 DIAGNOSIS — N186 End stage renal disease: Secondary | ICD-10-CM | POA: Diagnosis not present

## 2021-05-05 ENCOUNTER — Telehealth: Payer: Self-pay | Admitting: Behavioral Health

## 2021-05-05 ENCOUNTER — Ambulatory Visit: Payer: Medicare HMO | Admitting: Behavioral Health

## 2021-05-05 NOTE — Telephone Encounter (Signed)
Lft msgs w/Pt to [facility] to [facility] for r/s @ her convenience.  Dr. Theodis Shove

## 2021-05-06 DIAGNOSIS — N186 End stage renal disease: Secondary | ICD-10-CM | POA: Diagnosis not present

## 2021-05-06 DIAGNOSIS — Z992 Dependence on renal dialysis: Secondary | ICD-10-CM | POA: Diagnosis not present

## 2021-05-06 DIAGNOSIS — N2581 Secondary hyperparathyroidism of renal origin: Secondary | ICD-10-CM | POA: Diagnosis not present

## 2021-05-07 ENCOUNTER — Other Ambulatory Visit: Payer: Self-pay

## 2021-05-07 ENCOUNTER — Ambulatory Visit (HOSPITAL_COMMUNITY)
Admission: RE | Admit: 2021-05-07 | Discharge: 2021-05-07 | Disposition: A | Discharge status: Home / Self Care | Payer: Medicare HMO | Source: Ambulatory Visit | Attending: Vascular Surgery | Admitting: Vascular Surgery

## 2021-05-07 ENCOUNTER — Encounter: Payer: Self-pay | Admitting: Physician Assistant

## 2021-05-07 ENCOUNTER — Ambulatory Visit (INDEPENDENT_AMBULATORY_CARE_PROVIDER_SITE_OTHER): Payer: Medicare HMO | Admitting: Physician Assistant

## 2021-05-07 VITALS — BP 171/92 | HR 60 | Temp 97.8°F | Resp 20 | Ht 69.5 in

## 2021-05-07 DIAGNOSIS — Z992 Dependence on renal dialysis: Secondary | ICD-10-CM

## 2021-05-07 DIAGNOSIS — N186 End stage renal disease: Secondary | ICD-10-CM | POA: Insufficient documentation

## 2021-05-07 NOTE — Addendum Note (Signed)
Addended by: Nicholas Lose on: 05/07/2021 04:14 PM   Modules accepted: Orders

## 2021-05-08 DIAGNOSIS — N2581 Secondary hyperparathyroidism of renal origin: Secondary | ICD-10-CM | POA: Diagnosis not present

## 2021-05-08 DIAGNOSIS — Z992 Dependence on renal dialysis: Secondary | ICD-10-CM | POA: Diagnosis not present

## 2021-05-08 DIAGNOSIS — Z7689 Persons encountering health services in other specified circumstances: Secondary | ICD-10-CM | POA: Diagnosis not present

## 2021-05-08 DIAGNOSIS — N186 End stage renal disease: Secondary | ICD-10-CM | POA: Diagnosis not present

## 2021-05-10 ENCOUNTER — Encounter: Payer: Medicare HMO | Admitting: Student

## 2021-05-11 DIAGNOSIS — Z7689 Persons encountering health services in other specified circumstances: Secondary | ICD-10-CM | POA: Diagnosis not present

## 2021-05-11 DIAGNOSIS — N186 End stage renal disease: Secondary | ICD-10-CM | POA: Diagnosis not present

## 2021-05-11 DIAGNOSIS — Z992 Dependence on renal dialysis: Secondary | ICD-10-CM | POA: Diagnosis not present

## 2021-05-11 DIAGNOSIS — N2581 Secondary hyperparathyroidism of renal origin: Secondary | ICD-10-CM | POA: Diagnosis not present

## 2021-05-12 ENCOUNTER — Ambulatory Visit (HOSPITAL_COMMUNITY)
Admission: RE | Admit: 2021-05-12 | Discharge: 2021-05-12 | Disposition: A | Discharge status: Home / Self Care | Payer: Medicare HMO | Attending: Vascular Surgery | Admitting: Vascular Surgery

## 2021-05-12 ENCOUNTER — Encounter (HOSPITAL_COMMUNITY)
Admission: RE | Disposition: A | Discharge status: Home / Self Care | Payer: Self-pay | Source: Home / Self Care | Attending: Vascular Surgery

## 2021-05-12 ENCOUNTER — Other Ambulatory Visit: Payer: Self-pay

## 2021-05-12 DIAGNOSIS — N186 End stage renal disease: Secondary | ICD-10-CM | POA: Insufficient documentation

## 2021-05-12 DIAGNOSIS — Z992 Dependence on renal dialysis: Secondary | ICD-10-CM | POA: Diagnosis not present

## 2021-05-12 DIAGNOSIS — T82898A Other specified complication of vascular prosthetic devices, implants and grafts, initial encounter: Secondary | ICD-10-CM | POA: Diagnosis not present

## 2021-05-12 DIAGNOSIS — Z89511 Acquired absence of right leg below knee: Secondary | ICD-10-CM | POA: Diagnosis not present

## 2021-05-12 DIAGNOSIS — Y832 Surgical operation with anastomosis, bypass or graft as the cause of abnormal reaction of the patient, or of later complication, without mention of misadventure at the time of the procedure: Secondary | ICD-10-CM | POA: Insufficient documentation

## 2021-05-12 DIAGNOSIS — F419 Anxiety disorder, unspecified: Secondary | ICD-10-CM

## 2021-05-12 DIAGNOSIS — G629 Polyneuropathy, unspecified: Secondary | ICD-10-CM | POA: Insufficient documentation

## 2021-05-12 DIAGNOSIS — T82858A Stenosis of vascular prosthetic devices, implants and grafts, initial encounter: Secondary | ICD-10-CM | POA: Diagnosis not present

## 2021-05-12 HISTORY — PX: PERIPHERAL VASCULAR INTERVENTION: CATH118257

## 2021-05-12 HISTORY — PX: A/V FISTULAGRAM: CATH118298

## 2021-05-12 LAB — PREGNANCY, URINE: Preg Test, Ur: NEGATIVE

## 2021-05-12 SURGERY — A/V FISTULAGRAM
Anesthesia: LOCAL | Laterality: Left

## 2021-05-12 MED ORDER — LIDOCAINE HCL (PF) 1 % IJ SOLN
INTRAMUSCULAR | Status: AC
  Filled 2021-05-12: qty 30

## 2021-05-12 MED ORDER — SODIUM CHLORIDE 0.9% FLUSH: 3.0000 mL | Freq: Two times a day (BID) | INTRAVENOUS | Status: DC

## 2021-05-12 MED ORDER — HEPARIN (PORCINE) IN NACL 1000-0.9 UT/500ML-% IV SOLN
INTRAVENOUS | Status: AC
  Filled 2021-05-12: qty 500

## 2021-05-12 MED ORDER — HEPARIN (PORCINE) IN NACL 1000-0.9 UT/500ML-% IV SOLN
INTRAVENOUS | Status: DC | PRN
  Administered 2021-05-12: 500 mL

## 2021-05-12 MED ORDER — SODIUM CHLORIDE 0.9% FLUSH: 3.0000 mL | INTRAVENOUS | Status: DC | PRN

## 2021-05-12 MED ORDER — SODIUM CHLORIDE 0.9 % IV SOLN: 250.0000 mL | INTRAVENOUS | Status: DC | PRN

## 2021-05-12 MED ORDER — IODIXANOL 320 MG/ML IV SOLN
INTRAVENOUS | Status: DC | PRN
  Administered 2021-05-12: 11:00:00 60 mL

## 2021-05-12 MED ORDER — HEPARIN SODIUM (PORCINE) 1000 UNIT/ML IJ SOLN
INTRAMUSCULAR | Status: AC
  Filled 2021-05-12: qty 10

## 2021-05-12 MED ORDER — LIDOCAINE HCL (PF) 1 % IJ SOLN
INTRAMUSCULAR | Status: DC | PRN
  Administered 2021-05-12 (×2): 2 mL

## 2021-05-12 MED ORDER — AMITRIPTYLINE HCL 25 MG PO TABS: ORAL_TABLET | ORAL | 0 refills | Status: DC

## 2021-05-12 SURGICAL SUPPLY — 20 items
BAG SNAP BAND KOVER 36X36 (MISCELLANEOUS) ×4 IMPLANT
BALLN MUSTANG 6.0X100X75 (BALLOONS) ×3
BALLN MUSTANG 6.0X20 75 (BALLOONS) ×2
BALLN MUSTANG 6.0X20 75CM (BALLOONS) ×1
BALLN MUSTANG 6.0X40 75 (BALLOONS) ×2
BALLN MUSTANG 6.0X40 75CM (BALLOONS) ×1
BALLOON MUSTANG 6.0X100X75 (BALLOONS) IMPLANT
BALLOON MUSTANG 6.0X20 75 (BALLOONS) IMPLANT
BALLOON MUSTANG 6.0X40 75 (BALLOONS) IMPLANT
COVER DOME SNAP 22 D (MISCELLANEOUS) ×4 IMPLANT
KIT ENCORE 26 ADVANTAGE (KITS) ×2 IMPLANT
PROTECTION STATION PRESSURIZED (MISCELLANEOUS) ×3
SHEATH PINNACLE R/O II 6F 4CM (SHEATH) ×2 IMPLANT
SHEATH PROBE COVER 6X72 (BAG) ×4 IMPLANT
STATION PROTECTION PRESSURIZED (MISCELLANEOUS) ×2 IMPLANT
STOPCOCK MORSE 400PSI 3WAY (MISCELLANEOUS) ×4 IMPLANT
TRAY PV CATH (CUSTOM PROCEDURE TRAY) ×4 IMPLANT
TUBING CIL FLEX 10 FLL-RA (TUBING) ×4 IMPLANT
WIRE MICRO SET SILHO 5FR 7 (SHEATH) ×2 IMPLANT
WIRE STARTER BENTSON 035X150 (WIRE) ×2 IMPLANT

## 2021-05-12 NOTE — H&P (Signed)
POST OPERATIVE OFFICE NOTE   Patient seen and examined in preop holding.  No complaints. No changes to medication history or physical exam since last seen in clinic. Palpable pulse at the wrist, fistula pulsatile. After discussing the risks and benefits of left arm fistulagram, [name] [name] elected to proceed.   Broadus John MD   CC:  F/u for surgery  HPI:  This is a 45 y.o. female who is s/p superficialization of left BC AVF and ligation of multiple competing branches on 02/23/2021 by Dr. Scot Dock. She had original left BC AVF created on 01/05/2021 as well as TDC placement also by Dr. Scot Dock.    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 Sharol Given for infection.    Pt states she does  have some numbness in left hand due to neuropathy but this has not worsened since her fistula creation.  She states that she is ready to get her catheter out as she has not been able to wear a bra or go to water therapy for her leg.    The pt is on dialysis T/T/S at [facility] location.  She is right hand dominant.    Allergies  Allergen Reactions   Vancomycin Other (See Comments)    Acute renal failure suspected secondary to vanco    Current Facility-Administered Medications  Medication Dose Route Frequency Provider Last Rate Last Admin   0.9 %  sodium chloride infusion  250 mL Intravenous PRN Angelia Mould, MD       sodium chloride flush (NS) 0.9 % injection 3 mL  3 mL Intravenous Q12H Angelia Mould, MD       sodium chloride flush (NS) 0.9 % injection 3 mL  3 mL Intravenous PRN Angelia Mould, MD         ROS:  See HPI  Physical Exam:  Today's Vitals   05/12/21 0733 05/12/21 0750  BP: (!) 169/93   Pulse: 85   Resp: 18   Temp: 98.6 F (37 C)   TempSrc: Oral   SpO2: 100%   Weight: 122.5 kg   Height: 5' 9.5" (1.765 m)   PainSc:  0-No pain   Body mass index is 39.3 kg/m.   Incision:  all have healed nicely  Extremities:   There is a  palpable left radial pulse.   Motor and sensory are in tact.   There is a thrill present.  The fistula is easily palpable but is somewhat pulsatile proximally   Dialysis Duplex on 05/12/2021: Findings:  +--------------------+----------+-----------------+--------+   AVF                  PSV (cm/s) Flow Vol (mL/min) Comments   +--------------------+----------+-----------------+--------+   Native artery inflow    114            774                   +--------------------+----------+-----------------+--------+   AVF Anastomosis         127                                  +--------------------+----------+-----------------+--------+  +------------+----------+-------------+----------+--------+   OUTFLOW VEIN PSV (cm/s) Diameter (cm) Depth (cm) Describe   +------------+----------+-------------+----------+--------+   Prox UA         640         0.47  0.30    stenotic   +------------+----------+-------------+----------+--------+   Mid UA          366         0.42         0.29    stenotic   +------------+----------+-------------+----------+--------+   Dist UA          96         0.64         0.31               +------------+----------+-------------+----------+--------+   AC Fossa        220         1.32         0.24               +------------+----------+-------------+----------+--------+     Summary:  Arteriovenous fistula-Stenosis noted.  Elevated velocities in the mid and proximal upper arm segments of the  cephalic vein.    Assessment/Plan:  This is a 45 y.o. female who is s/p: superficialization of left BC AVF and ligation of multiple competing branches on 02/23/2021 by Dr. Scot Dock. She had original left BC AVF created on 01/05/2021 as well as TDC placement also by Dr. Scot Dock.     -the pt does not have evidence of steal. -the fistula is not maturing in the mid and upper arm despite ligation of branches and superficialization.  I discussed doing a fistulogram to evaluate her  fistula and possible intervention.   -discussed with pt that access does not last forever and will need intervention or even new access at some point.  I did discuss with her that there is a chance that this fistula does not mature and she would need new access but we would like to try to get this up and working for her.  She is in agreement.  -we will schedule this for a non dialysis day when we have availability   [name] [name], [facility] [phone]  Clinic MD:  Virl Cagey

## 2021-05-12 NOTE — Op Note (Signed)
° ° °  Patient name: [name] [name] MRN: [id number] DOB: 09/22/1975 Sex: female  05/12/2021 Pre-operative Diagnosis: End-stage renal disease, poor fistula maturation Post-operative diagnosis:  Same Surgeon:  Broadus John, MD Procedure Performed: 1.  Ultrasound-guided micropuncture access of the left brachiocephalic fistula. 2.  6 x 100 balloon angioplasty of the cephalic vein 3.  6 x 40 balloon angioplasty of the cephalic vein   Indications: Patient is a 45 year old female who is status post left brachiocephalic aVF superficialization and ligation of multiple competing branches on 03/15/2021 by Dr. Scot Dock.  The fistula was felt to mature and demonstrated pulsatility on the recent duplex ultrasonography.  After discussing the risk and benefits of left arm fistulogram, [place] elected to proceed.  Findings:  Widely patent brachiocephalic anastomosis 67% stenosis 2 cm from the anastomosis in the cephalic vein 8 cm tapering stenosis greater than 80% appreciated in the cephalic vein at the mid humerus No central stenosis appreciated   Procedure:   Patient was brought to the Cath Lab and laid in the supine position.  She was prepped and draped in standard fashion a timeout was performed.  The case began with ultrasound-guided micropuncture access of the left arm brachiocephalic fistula roughly 3 cm from the anastomosis.  Diagnostic fistulogram followed.  See description above.  I elected to intervene on the cephalic vein stenoses.  A short 6 mm sheath was placed and a 6 x 146mm balloon brought onto the field.  This was inflated along the midportion of the cephalic vein along an area that demonstrated 8 cm of tapering stenosis.  Follow-up angiography demonstrated excellent result.  Retrograde angiogram demonstrated stenosis proximal to our access site.  Therefore the previous access site was closed with the use of Monocryl suture and the patient was reaccessed in the mid humerus in  retrograde fashion.  A 6 x 40 mm balloon was brought onto the field and used to cross the proximal cephalic vein.  Follow-up angiography demonstrated excellent result with resolution of the stenosis.  Impression: Resolution of cephalic vein stenosis 3 cm from the anastomosis, as well as at the mid humerus.    Cassandria Santee, MD [facility] Office: [phone]

## 2021-05-13 ENCOUNTER — Encounter (HOSPITAL_COMMUNITY): Payer: Self-pay | Admitting: Vascular Surgery

## 2021-05-13 DIAGNOSIS — Z7689 Persons encountering health services in other specified circumstances: Secondary | ICD-10-CM | POA: Diagnosis not present

## 2021-05-13 DIAGNOSIS — N186 End stage renal disease: Secondary | ICD-10-CM | POA: Diagnosis not present

## 2021-05-13 DIAGNOSIS — Z992 Dependence on renal dialysis: Secondary | ICD-10-CM | POA: Diagnosis not present

## 2021-05-13 DIAGNOSIS — N2581 Secondary hyperparathyroidism of renal origin: Secondary | ICD-10-CM | POA: Diagnosis not present

## 2021-05-13 MED FILL — Heparin Sodium (Porcine) Inj 1000 Unit/ML: INTRAMUSCULAR | Qty: 10 | Status: AC

## 2021-05-15 DIAGNOSIS — N186 End stage renal disease: Secondary | ICD-10-CM | POA: Diagnosis not present

## 2021-05-15 DIAGNOSIS — N2581 Secondary hyperparathyroidism of renal origin: Secondary | ICD-10-CM | POA: Diagnosis not present

## 2021-05-15 DIAGNOSIS — Z992 Dependence on renal dialysis: Secondary | ICD-10-CM | POA: Diagnosis not present

## 2021-05-15 DIAGNOSIS — Z7689 Persons encountering health services in other specified circumstances: Secondary | ICD-10-CM | POA: Diagnosis not present

## 2021-05-18 DIAGNOSIS — Z7689 Persons encountering health services in other specified circumstances: Secondary | ICD-10-CM | POA: Diagnosis not present

## 2021-05-18 DIAGNOSIS — N2581 Secondary hyperparathyroidism of renal origin: Secondary | ICD-10-CM | POA: Diagnosis not present

## 2021-05-18 DIAGNOSIS — N186 End stage renal disease: Secondary | ICD-10-CM | POA: Diagnosis not present

## 2021-05-18 DIAGNOSIS — Z992 Dependence on renal dialysis: Secondary | ICD-10-CM | POA: Diagnosis not present

## 2021-05-19 ENCOUNTER — Other Ambulatory Visit: Payer: Self-pay | Admitting: Student

## 2021-05-19 DIAGNOSIS — F32A Depression, unspecified: Secondary | ICD-10-CM

## 2021-05-20 DIAGNOSIS — Z7689 Persons encountering health services in other specified circumstances: Secondary | ICD-10-CM | POA: Diagnosis not present

## 2021-05-20 DIAGNOSIS — N2581 Secondary hyperparathyroidism of renal origin: Secondary | ICD-10-CM | POA: Diagnosis not present

## 2021-05-20 DIAGNOSIS — Z992 Dependence on renal dialysis: Secondary | ICD-10-CM | POA: Diagnosis not present

## 2021-05-20 DIAGNOSIS — N186 End stage renal disease: Secondary | ICD-10-CM | POA: Diagnosis not present

## 2021-05-20 NOTE — Telephone Encounter (Addendum)
Call to [facility] have not received the prescription from Dr. Unk Lightning for the Amitriptyline that was written on 05/19/2021.  Last office 02/24/2021 .  Last [facility] was 02/13/2020.

## 2021-05-21 NOTE — Telephone Encounter (Signed)
Patient is on amitriptyline 100mg  daily per chart review. I'm not sure of the additional benefit for further amitriptyline. Would recommend follow up with prescribing MD if needed.

## 2021-05-22 DIAGNOSIS — N186 End stage renal disease: Secondary | ICD-10-CM | POA: Diagnosis not present

## 2021-05-22 DIAGNOSIS — E1122 Type 2 diabetes mellitus with diabetic chronic kidney disease: Secondary | ICD-10-CM | POA: Diagnosis not present

## 2021-05-22 DIAGNOSIS — Z992 Dependence on renal dialysis: Secondary | ICD-10-CM | POA: Diagnosis not present

## 2021-05-22 DIAGNOSIS — Z7689 Persons encountering health services in other specified circumstances: Secondary | ICD-10-CM | POA: Diagnosis not present

## 2021-05-22 DIAGNOSIS — N2581 Secondary hyperparathyroidism of renal origin: Secondary | ICD-10-CM | POA: Diagnosis not present

## 2021-05-26 ENCOUNTER — Other Ambulatory Visit: Payer: Self-pay | Admitting: Student

## 2021-05-26 DIAGNOSIS — Z89512 Acquired absence of left leg below knee: Secondary | ICD-10-CM

## 2021-05-27 DIAGNOSIS — Z992 Dependence on renal dialysis: Secondary | ICD-10-CM | POA: Diagnosis not present

## 2021-05-27 DIAGNOSIS — N2581 Secondary hyperparathyroidism of renal origin: Secondary | ICD-10-CM | POA: Diagnosis not present

## 2021-05-27 DIAGNOSIS — Z7689 Persons encountering health services in other specified circumstances: Secondary | ICD-10-CM | POA: Diagnosis not present

## 2021-05-27 DIAGNOSIS — N186 End stage renal disease: Secondary | ICD-10-CM | POA: Diagnosis not present

## 2021-05-28 ENCOUNTER — Telehealth: Payer: Self-pay | Admitting: *Deleted

## 2021-05-28 NOTE — Telephone Encounter (Signed)
Call from [facility] need Clarification for dosing of Diclofenac Gel.

## 2021-05-29 DIAGNOSIS — N2581 Secondary hyperparathyroidism of renal origin: Secondary | ICD-10-CM | POA: Diagnosis not present

## 2021-05-29 DIAGNOSIS — Z7689 Persons encountering health services in other specified circumstances: Secondary | ICD-10-CM | POA: Diagnosis not present

## 2021-05-29 DIAGNOSIS — Z992 Dependence on renal dialysis: Secondary | ICD-10-CM | POA: Diagnosis not present

## 2021-05-29 DIAGNOSIS — N186 End stage renal disease: Secondary | ICD-10-CM | POA: Diagnosis not present

## 2021-05-31 NOTE — Telephone Encounter (Signed)
Next appt scheduled 07/21/21 with PCP.

## 2021-06-01 DIAGNOSIS — Z7689 Persons encountering health services in other specified circumstances: Secondary | ICD-10-CM | POA: Diagnosis not present

## 2021-06-01 DIAGNOSIS — N2581 Secondary hyperparathyroidism of renal origin: Secondary | ICD-10-CM | POA: Diagnosis not present

## 2021-06-01 DIAGNOSIS — N186 End stage renal disease: Secondary | ICD-10-CM | POA: Diagnosis not present

## 2021-06-01 DIAGNOSIS — Z992 Dependence on renal dialysis: Secondary | ICD-10-CM | POA: Diagnosis not present

## 2021-06-03 DIAGNOSIS — Z7689 Persons encountering health services in other specified circumstances: Secondary | ICD-10-CM | POA: Diagnosis not present

## 2021-06-03 DIAGNOSIS — Z992 Dependence on renal dialysis: Secondary | ICD-10-CM | POA: Diagnosis not present

## 2021-06-03 DIAGNOSIS — N2581 Secondary hyperparathyroidism of renal origin: Secondary | ICD-10-CM | POA: Diagnosis not present

## 2021-06-03 DIAGNOSIS — N186 End stage renal disease: Secondary | ICD-10-CM | POA: Diagnosis not present

## 2021-06-04 DIAGNOSIS — N186 End stage renal disease: Secondary | ICD-10-CM | POA: Diagnosis not present

## 2021-06-04 DIAGNOSIS — Z992 Dependence on renal dialysis: Secondary | ICD-10-CM | POA: Diagnosis not present

## 2021-06-04 DIAGNOSIS — Z452 Encounter for adjustment and management of vascular access device: Secondary | ICD-10-CM | POA: Diagnosis not present

## 2021-06-05 DIAGNOSIS — N186 End stage renal disease: Secondary | ICD-10-CM | POA: Diagnosis not present

## 2021-06-05 DIAGNOSIS — Z7689 Persons encountering health services in other specified circumstances: Secondary | ICD-10-CM | POA: Diagnosis not present

## 2021-06-05 DIAGNOSIS — N2581 Secondary hyperparathyroidism of renal origin: Secondary | ICD-10-CM | POA: Diagnosis not present

## 2021-06-05 DIAGNOSIS — Z992 Dependence on renal dialysis: Secondary | ICD-10-CM | POA: Diagnosis not present

## 2021-06-08 DIAGNOSIS — N186 End stage renal disease: Secondary | ICD-10-CM | POA: Diagnosis not present

## 2021-06-08 DIAGNOSIS — N2581 Secondary hyperparathyroidism of renal origin: Secondary | ICD-10-CM | POA: Diagnosis not present

## 2021-06-08 DIAGNOSIS — Z7689 Persons encountering health services in other specified circumstances: Secondary | ICD-10-CM | POA: Diagnosis not present

## 2021-06-08 DIAGNOSIS — Z992 Dependence on renal dialysis: Secondary | ICD-10-CM | POA: Diagnosis not present

## 2021-06-10 DIAGNOSIS — N186 End stage renal disease: Secondary | ICD-10-CM | POA: Diagnosis not present

## 2021-06-10 DIAGNOSIS — N2581 Secondary hyperparathyroidism of renal origin: Secondary | ICD-10-CM | POA: Diagnosis not present

## 2021-06-10 DIAGNOSIS — Z992 Dependence on renal dialysis: Secondary | ICD-10-CM | POA: Diagnosis not present

## 2021-06-10 DIAGNOSIS — Z7689 Persons encountering health services in other specified circumstances: Secondary | ICD-10-CM | POA: Diagnosis not present

## 2021-06-12 DIAGNOSIS — N186 End stage renal disease: Secondary | ICD-10-CM | POA: Diagnosis not present

## 2021-06-12 DIAGNOSIS — N2581 Secondary hyperparathyroidism of renal origin: Secondary | ICD-10-CM | POA: Diagnosis not present

## 2021-06-12 DIAGNOSIS — Z7689 Persons encountering health services in other specified circumstances: Secondary | ICD-10-CM | POA: Diagnosis not present

## 2021-06-12 DIAGNOSIS — Z992 Dependence on renal dialysis: Secondary | ICD-10-CM | POA: Diagnosis not present

## 2021-06-14 ENCOUNTER — Other Ambulatory Visit: Payer: Self-pay

## 2021-06-14 DIAGNOSIS — N186 End stage renal disease: Secondary | ICD-10-CM

## 2021-06-14 DIAGNOSIS — Z992 Dependence on renal dialysis: Secondary | ICD-10-CM

## 2021-06-15 DIAGNOSIS — N186 End stage renal disease: Secondary | ICD-10-CM | POA: Diagnosis not present

## 2021-06-15 DIAGNOSIS — N2581 Secondary hyperparathyroidism of renal origin: Secondary | ICD-10-CM | POA: Diagnosis not present

## 2021-06-15 DIAGNOSIS — Z992 Dependence on renal dialysis: Secondary | ICD-10-CM | POA: Diagnosis not present

## 2021-06-15 DIAGNOSIS — Z7689 Persons encountering health services in other specified circumstances: Secondary | ICD-10-CM | POA: Diagnosis not present

## 2021-06-17 DIAGNOSIS — N186 End stage renal disease: Secondary | ICD-10-CM | POA: Diagnosis not present

## 2021-06-17 DIAGNOSIS — Z7689 Persons encountering health services in other specified circumstances: Secondary | ICD-10-CM | POA: Diagnosis not present

## 2021-06-17 DIAGNOSIS — Z992 Dependence on renal dialysis: Secondary | ICD-10-CM | POA: Diagnosis not present

## 2021-06-17 DIAGNOSIS — N2581 Secondary hyperparathyroidism of renal origin: Secondary | ICD-10-CM | POA: Diagnosis not present

## 2021-06-19 DIAGNOSIS — Z992 Dependence on renal dialysis: Secondary | ICD-10-CM | POA: Diagnosis not present

## 2021-06-19 DIAGNOSIS — N186 End stage renal disease: Secondary | ICD-10-CM | POA: Diagnosis not present

## 2021-06-19 DIAGNOSIS — Z7689 Persons encountering health services in other specified circumstances: Secondary | ICD-10-CM | POA: Diagnosis not present

## 2021-06-19 DIAGNOSIS — N2581 Secondary hyperparathyroidism of renal origin: Secondary | ICD-10-CM | POA: Diagnosis not present

## 2021-06-22 DIAGNOSIS — N2581 Secondary hyperparathyroidism of renal origin: Secondary | ICD-10-CM | POA: Diagnosis not present

## 2021-06-22 DIAGNOSIS — E1122 Type 2 diabetes mellitus with diabetic chronic kidney disease: Secondary | ICD-10-CM | POA: Diagnosis not present

## 2021-06-22 DIAGNOSIS — Z992 Dependence on renal dialysis: Secondary | ICD-10-CM | POA: Diagnosis not present

## 2021-06-22 DIAGNOSIS — Z7689 Persons encountering health services in other specified circumstances: Secondary | ICD-10-CM | POA: Diagnosis not present

## 2021-06-22 DIAGNOSIS — N186 End stage renal disease: Secondary | ICD-10-CM | POA: Diagnosis not present

## 2021-06-24 ENCOUNTER — Other Ambulatory Visit: Payer: Self-pay | Admitting: Student

## 2021-06-24 ENCOUNTER — Other Ambulatory Visit: Payer: Self-pay | Admitting: Internal Medicine

## 2021-06-24 DIAGNOSIS — Z7689 Persons encountering health services in other specified circumstances: Secondary | ICD-10-CM | POA: Diagnosis not present

## 2021-06-24 DIAGNOSIS — N186 End stage renal disease: Secondary | ICD-10-CM | POA: Diagnosis not present

## 2021-06-24 DIAGNOSIS — Z992 Dependence on renal dialysis: Secondary | ICD-10-CM | POA: Diagnosis not present

## 2021-06-24 DIAGNOSIS — N2581 Secondary hyperparathyroidism of renal origin: Secondary | ICD-10-CM | POA: Diagnosis not present

## 2021-06-24 DIAGNOSIS — E1121 Type 2 diabetes mellitus with diabetic nephropathy: Secondary | ICD-10-CM

## 2021-06-24 DIAGNOSIS — G894 Chronic pain syndrome: Secondary | ICD-10-CM

## 2021-06-24 NOTE — Assessment & Plan Note (Signed)
Assessment: Patient continues to have chronic pain that was previously well controlled on percocet. Her kidney disease recently progressed to ESRD and patient is currently on dialysis. She was switched to oral dilaudid however her pain was not well controlled on this. She was transitioned back to a lower dose of her percocet understanding the risk of taking this medication with her ESRD.   Plan: -Percocet 2.5 BID, UDS at follow up appointment

## 2021-06-24 NOTE — Telephone Encounter (Signed)
Last rx written  05/22/21. Last OV  02/24/21. Next OV  07/21/21. UDS 02/13/20.

## 2021-06-24 NOTE — Telephone Encounter (Signed)
Next appt scheduled 07/21/21 with PCP.

## 2021-06-25 ENCOUNTER — Inpatient Hospital Stay (HOSPITAL_COMMUNITY): Admission: RE | Admit: 2021-06-25 | Payer: Medicare HMO | Source: Ambulatory Visit

## 2021-06-26 DIAGNOSIS — N186 End stage renal disease: Secondary | ICD-10-CM | POA: Diagnosis not present

## 2021-06-26 DIAGNOSIS — N2581 Secondary hyperparathyroidism of renal origin: Secondary | ICD-10-CM | POA: Diagnosis not present

## 2021-06-26 DIAGNOSIS — Z992 Dependence on renal dialysis: Secondary | ICD-10-CM | POA: Diagnosis not present

## 2021-06-26 DIAGNOSIS — Z7689 Persons encountering health services in other specified circumstances: Secondary | ICD-10-CM | POA: Diagnosis not present

## 2021-06-29 DIAGNOSIS — N2581 Secondary hyperparathyroidism of renal origin: Secondary | ICD-10-CM | POA: Diagnosis not present

## 2021-06-29 DIAGNOSIS — N186 End stage renal disease: Secondary | ICD-10-CM | POA: Diagnosis not present

## 2021-06-29 DIAGNOSIS — Z992 Dependence on renal dialysis: Secondary | ICD-10-CM | POA: Diagnosis not present

## 2021-06-29 DIAGNOSIS — Z7689 Persons encountering health services in other specified circumstances: Secondary | ICD-10-CM | POA: Diagnosis not present

## 2021-07-01 DIAGNOSIS — Z992 Dependence on renal dialysis: Secondary | ICD-10-CM | POA: Diagnosis not present

## 2021-07-01 DIAGNOSIS — N186 End stage renal disease: Secondary | ICD-10-CM | POA: Diagnosis not present

## 2021-07-01 DIAGNOSIS — N2581 Secondary hyperparathyroidism of renal origin: Secondary | ICD-10-CM | POA: Diagnosis not present

## 2021-07-01 DIAGNOSIS — Z7689 Persons encountering health services in other specified circumstances: Secondary | ICD-10-CM | POA: Diagnosis not present

## 2021-07-03 DIAGNOSIS — Z7689 Persons encountering health services in other specified circumstances: Secondary | ICD-10-CM | POA: Diagnosis not present

## 2021-07-03 DIAGNOSIS — N2581 Secondary hyperparathyroidism of renal origin: Secondary | ICD-10-CM | POA: Diagnosis not present

## 2021-07-03 DIAGNOSIS — N186 End stage renal disease: Secondary | ICD-10-CM | POA: Diagnosis not present

## 2021-07-03 DIAGNOSIS — Z992 Dependence on renal dialysis: Secondary | ICD-10-CM | POA: Diagnosis not present

## 2021-07-06 DIAGNOSIS — N186 End stage renal disease: Secondary | ICD-10-CM | POA: Diagnosis not present

## 2021-07-06 DIAGNOSIS — Z992 Dependence on renal dialysis: Secondary | ICD-10-CM | POA: Diagnosis not present

## 2021-07-06 DIAGNOSIS — Z7689 Persons encountering health services in other specified circumstances: Secondary | ICD-10-CM | POA: Diagnosis not present

## 2021-07-06 DIAGNOSIS — N2581 Secondary hyperparathyroidism of renal origin: Secondary | ICD-10-CM | POA: Diagnosis not present

## 2021-07-08 DIAGNOSIS — Z7689 Persons encountering health services in other specified circumstances: Secondary | ICD-10-CM | POA: Diagnosis not present

## 2021-07-08 DIAGNOSIS — N186 End stage renal disease: Secondary | ICD-10-CM | POA: Diagnosis not present

## 2021-07-08 DIAGNOSIS — N2581 Secondary hyperparathyroidism of renal origin: Secondary | ICD-10-CM | POA: Diagnosis not present

## 2021-07-08 DIAGNOSIS — Z992 Dependence on renal dialysis: Secondary | ICD-10-CM | POA: Diagnosis not present

## 2021-07-10 DIAGNOSIS — N2581 Secondary hyperparathyroidism of renal origin: Secondary | ICD-10-CM | POA: Diagnosis not present

## 2021-07-10 DIAGNOSIS — Z992 Dependence on renal dialysis: Secondary | ICD-10-CM | POA: Diagnosis not present

## 2021-07-10 DIAGNOSIS — N186 End stage renal disease: Secondary | ICD-10-CM | POA: Diagnosis not present

## 2021-07-10 DIAGNOSIS — Z7689 Persons encountering health services in other specified circumstances: Secondary | ICD-10-CM | POA: Diagnosis not present

## 2021-07-13 DIAGNOSIS — N186 End stage renal disease: Secondary | ICD-10-CM | POA: Diagnosis not present

## 2021-07-13 DIAGNOSIS — Z7689 Persons encountering health services in other specified circumstances: Secondary | ICD-10-CM | POA: Diagnosis not present

## 2021-07-13 DIAGNOSIS — N2581 Secondary hyperparathyroidism of renal origin: Secondary | ICD-10-CM | POA: Diagnosis not present

## 2021-07-13 DIAGNOSIS — Z992 Dependence on renal dialysis: Secondary | ICD-10-CM | POA: Diagnosis not present

## 2021-07-14 ENCOUNTER — Other Ambulatory Visit: Payer: Self-pay | Admitting: Student

## 2021-07-14 DIAGNOSIS — Z89512 Acquired absence of left leg below knee: Secondary | ICD-10-CM

## 2021-07-14 NOTE — Telephone Encounter (Signed)
Next appt scheduled 07/21/21 with PCP.

## 2021-07-15 DIAGNOSIS — N186 End stage renal disease: Secondary | ICD-10-CM | POA: Diagnosis not present

## 2021-07-15 DIAGNOSIS — N2581 Secondary hyperparathyroidism of renal origin: Secondary | ICD-10-CM | POA: Diagnosis not present

## 2021-07-15 DIAGNOSIS — Z992 Dependence on renal dialysis: Secondary | ICD-10-CM | POA: Diagnosis not present

## 2021-07-16 ENCOUNTER — Ambulatory Visit: Payer: Medicare HMO | Admitting: Family

## 2021-07-17 DIAGNOSIS — N2581 Secondary hyperparathyroidism of renal origin: Secondary | ICD-10-CM | POA: Diagnosis not present

## 2021-07-17 DIAGNOSIS — N186 End stage renal disease: Secondary | ICD-10-CM | POA: Diagnosis not present

## 2021-07-17 DIAGNOSIS — Z7689 Persons encountering health services in other specified circumstances: Secondary | ICD-10-CM | POA: Diagnosis not present

## 2021-07-17 DIAGNOSIS — Z992 Dependence on renal dialysis: Secondary | ICD-10-CM | POA: Diagnosis not present

## 2021-07-19 DIAGNOSIS — Z7689 Persons encountering health services in other specified circumstances: Secondary | ICD-10-CM | POA: Diagnosis not present

## 2021-07-19 DIAGNOSIS — N186 End stage renal disease: Secondary | ICD-10-CM | POA: Diagnosis not present

## 2021-07-19 DIAGNOSIS — E8770 Fluid overload, unspecified: Secondary | ICD-10-CM | POA: Diagnosis not present

## 2021-07-19 DIAGNOSIS — N2581 Secondary hyperparathyroidism of renal origin: Secondary | ICD-10-CM | POA: Diagnosis not present

## 2021-07-19 DIAGNOSIS — Z992 Dependence on renal dialysis: Secondary | ICD-10-CM | POA: Diagnosis not present

## 2021-07-20 DIAGNOSIS — Z7689 Persons encountering health services in other specified circumstances: Secondary | ICD-10-CM | POA: Diagnosis not present

## 2021-07-20 DIAGNOSIS — N186 End stage renal disease: Secondary | ICD-10-CM | POA: Diagnosis not present

## 2021-07-20 DIAGNOSIS — Z992 Dependence on renal dialysis: Secondary | ICD-10-CM | POA: Diagnosis not present

## 2021-07-20 DIAGNOSIS — N2581 Secondary hyperparathyroidism of renal origin: Secondary | ICD-10-CM | POA: Diagnosis not present

## 2021-07-20 DIAGNOSIS — E1122 Type 2 diabetes mellitus with diabetic chronic kidney disease: Secondary | ICD-10-CM | POA: Diagnosis not present

## 2021-07-20 DIAGNOSIS — E8770 Fluid overload, unspecified: Secondary | ICD-10-CM | POA: Diagnosis not present

## 2021-07-21 ENCOUNTER — Encounter: Payer: Self-pay | Admitting: Student

## 2021-07-21 ENCOUNTER — Other Ambulatory Visit: Payer: Self-pay

## 2021-07-21 ENCOUNTER — Ambulatory Visit (INDEPENDENT_AMBULATORY_CARE_PROVIDER_SITE_OTHER): Payer: Medicare HMO | Admitting: Student

## 2021-07-21 VITALS — BP 129/70 | HR 79 | Temp 98.2°F | Ht 69.5 in | Wt 271.9 lb

## 2021-07-21 DIAGNOSIS — E039 Hypothyroidism, unspecified: Secondary | ICD-10-CM

## 2021-07-21 DIAGNOSIS — E1159 Type 2 diabetes mellitus with other circulatory complications: Secondary | ICD-10-CM

## 2021-07-21 DIAGNOSIS — Z89612 Acquired absence of left leg above knee: Secondary | ICD-10-CM | POA: Diagnosis not present

## 2021-07-21 DIAGNOSIS — E1121 Type 2 diabetes mellitus with diabetic nephropathy: Secondary | ICD-10-CM

## 2021-07-21 DIAGNOSIS — R0989 Other specified symptoms and signs involving the circulatory and respiratory systems: Secondary | ICD-10-CM

## 2021-07-21 DIAGNOSIS — F419 Anxiety disorder, unspecified: Secondary | ICD-10-CM

## 2021-07-21 DIAGNOSIS — E1122 Type 2 diabetes mellitus with diabetic chronic kidney disease: Secondary | ICD-10-CM

## 2021-07-21 DIAGNOSIS — F32A Depression, unspecified: Secondary | ICD-10-CM

## 2021-07-21 DIAGNOSIS — I152 Hypertension secondary to endocrine disorders: Secondary | ICD-10-CM

## 2021-07-21 DIAGNOSIS — G894 Chronic pain syndrome: Secondary | ICD-10-CM

## 2021-07-21 DIAGNOSIS — N186 End stage renal disease: Secondary | ICD-10-CM | POA: Diagnosis not present

## 2021-07-21 DIAGNOSIS — Z992 Dependence on renal dialysis: Secondary | ICD-10-CM

## 2021-07-21 DIAGNOSIS — Z0000 Encounter for general adult medical examination without abnormal findings: Secondary | ICD-10-CM

## 2021-07-21 DIAGNOSIS — E038 Other specified hypothyroidism: Secondary | ICD-10-CM

## 2021-07-21 HISTORY — DX: Other specified symptoms and signs involving the circulatory and respiratory systems: R09.89

## 2021-07-21 MED ORDER — OXYCODONE-ACETAMINOPHEN 5-325 MG PO TABS: ORAL_TABLET | ORAL | 0 refills | Status: DC

## 2021-07-21 MED ORDER — VICTOZA 18 MG/3ML ~~LOC~~ SOPN: 3.0000 mg | PEN_INJECTOR | Freq: Every day | SUBCUTANEOUS | 1 refills | Status: DC

## 2021-07-21 MED ORDER — FUROSEMIDE 20 MG PO TABS: 20.0000 mg | ORAL_TABLET | Freq: Every day | ORAL | 0 refills | Status: DC

## 2021-07-21 MED ORDER — CYCLOBENZAPRINE HCL 5 MG PO TABS: 5.0000 mg | ORAL_TABLET | Freq: Every day | ORAL | 0 refills | Status: DC | PRN

## 2021-07-21 MED ORDER — VICTOZA 18 MG/3ML ~~LOC~~ SOPN: 3.0000 mg | PEN_INJECTOR | SUBCUTANEOUS | 1 refills | Status: DC

## 2021-07-21 NOTE — Assessment & Plan Note (Addendum)
Patient has continued to lose weight since starting dialysis and needs a new prosthetic.  Will write DME order for this, instructed patient to follow-up with [facility] to fill the order. ? ?Addendum:  ?Updated patient that referral sent, she is requesting to start back with physical therapy. Since starting dialysis she has lost weight and would like to start back with PT to help with her new prosthetic etc. Referral placed.  ?

## 2021-07-21 NOTE — Assessment & Plan Note (Signed)
Patient to follow up with Dr. Theodis Shove. Will reach out to front desk to see if they can schedule patient an appointment.  ?

## 2021-07-21 NOTE — Assessment & Plan Note (Signed)
Assessment: Patient with weak pulse in right lower extremity.  Able to appreciate posterior tibialis however had difficult time assessing dorsalis pedis.  ABIs performed in the office were 1.01.  These may be falsely elevated with her history of diabetes and ESRD.  We will order TBI's.  Plan: -We will schedule ultrasound ABI with and without TBI's

## 2021-07-21 NOTE — Assessment & Plan Note (Signed)
Current pain regimen of Percocet 5- 320 5/2 tab twice daily, Flexeril 5 mg as needed daily.  Discussed risks and benefits of taking Percocets versus nonrenally excreted opioids.  Patient would like to continue on her Percocets as she notes that the other opioids did not help with her pain is much as Percocet.  Plan for UDS at follow-up visit. ?

## 2021-07-21 NOTE — Patient Instructions (Addendum)
Thank you, Ms.[place] for allowing [place] to provide your care today. Today we discussed  ? ? ?I have ordered the following labs for you: ? ?Weight loss/diabetes ?We will be increasing her Victoza to 3 mg weekly.  This next week please increase to 2.4 mg and then increase to 3 mg. ? ?Prosthetic ?I have sent I have placed an order for a new prosthetic.  Please follow-up with [facility] for this. ? ?Pain medications ?I have refilled your Percocet as well as your Flexeril. ? ?Counseling ?I will reach out to Dr. Carolynne Edouard for [place] to find an appointment time for you ? ?Blood pressure ?We will start you on Lasix 20 mg on nondialysis days.  Please follow-up with Dr. Hollie Salk or one of the nephrology providers tomorrow to discuss this ? ?Healthcare maintenance ?Please follow-up with the pharmacy to get your next COVID shot.  We also placed an order for colon cancer screening test ? ? ?Lab Orders    ?     Fecal occult blood, imunochemical    ?     Hemoglobin A1c    ?     TSH     ? ?Tests ordered today: ? ?TSH ?A1c ? ?Referrals ordered today:  ? ?Referral Orders  ?No referral(s) requested today  ?  ? ?I have ordered the following medication/changed the following medications:  ? ?Stop the following medications: ?Medications Discontinued During This Encounter  ?Medication Reason  ? lisinopril (ZESTRIL) 10 MG tablet   ? amitriptyline (ELAVIL) 25 MG tablet   ? cyclobenzaprine (FLEXERIL) 5 MG tablet Reorder  ? oxyCODONE-acetaminophen (PERCOCET/ROXICET) 5-325 MG tablet Reorder  ? VICTOZA 18 MG/3ML SOPN Reorder  ?  ? ?Start the following medications: ?Meds ordered this encounter  ?Medications  ? furosemide (LASIX) 20 MG tablet  ?  Sig: Take 1 tablet (20 mg total) by mouth daily. Take on non-dialysis days. Adjust per kidney docs  ?  Dispense:  30 tablet  ?  Refill:  0  ? cyclobenzaprine (FLEXERIL) 5 MG tablet  ?  Sig: Take 1 tablet (5 mg total) by mouth daily as needed for muscle spasms.  ?  Dispense:  30 tablet  ?  Refill:  0   ? oxyCODONE-acetaminophen (PERCOCET/ROXICET) 5-325 MG tablet  ?  Sig: TAKE 1/2 TABLET BY MOUTH 2 TIMES DAILY AS NEEDED FOR SEVERE PAIN  ?  Dispense:  30 tablet  ?  Refill:  0  ? liraglutide (VICTOZA) 18 MG/3ML SOPN  ?  Sig: Inject 3 mg into the skin once a week. Inject 2.4 mg weekly then increase to 3 mg  ?  Dispense:  27 mL  ?  Refill:  1  ?  This prescription was filled on 06/24/2021. Any refills authorized will be placed on file.  ?  ? ?Follow up: 4-6 months  ? ? ?Should you have any questions or concerns please call the internal medicine clinic at [phone].   ? ?Sanjuana Letters, D.O. ?[place] ? ? ?

## 2021-07-21 NOTE — Assessment & Plan Note (Addendum)
Assessment: ?Last A1c of 5.4% 02/2021. Current regimen of victoza. Will repeat today and increase victoza for weight loss benefit ? ?Plan: ?-Titrate up victoza to 3.0 mg daily ?-A1c pending ? ?Addendum: A1c increased to 7.7. A1c goal for this patient on HD would be around 7-8% with her multiple co-morbid conditions Hopeful increasing victoza will keep her within goal. Repeat A1c in 3 months.  ? ? ? ?

## 2021-07-21 NOTE — Progress Notes (Signed)
? ?CC: follow up ESRD, HTN, Chronic Pain, s/p left AKA ? ?HPI: ? ?[name] [name] is a 46 y.o. female with a past medical history stated below and presents today for follow up of her chronic medical conditions. Please see problem based assessment and plan for additional details. ? ?Past Medical History:  ?Diagnosis Date  ? Abdominal muscle pain 09/08/2016  ? Abnormal Pap smear of cervix 2009  ? Abscess   ? history of multiple abscesses  ? Acute bilateral low back pain 02/13/2017  ? Acute blood loss anemia   ? Acute on chronic renal failure ([place]) 07/12/2012  ? Acute renal failure ([place]) 07/12/2012  ? Adjustment disorder with depressed mood 03/26/2017  ? AKI (acute kidney injury) ([place])   ? Anemia 10/21/2019  ? Anemia of chronic disease 2002  ? Anginal pain ([place])   ? Anxiety   ? Panic attacks  ? Bilateral lower extremity edema 05/13/2016  ? Bipolar disorder ([place])   ? Cataract   ? B/L cataract  ? Cellulitis 05/21/2014  ? right eye  ? CHF (congestive heart failure) ([place])   ? Chronic bronchitis ([place])   ? "get it q yr" (05/13/2013)  ? Chronic pain   ? Chronic pain of right knee 09/08/2016  ? Dehiscence of amputation stump ([facility])   ? Depression   ? Edema of lower extremity   ? Endocarditis 2002  ? subacute bacterial endocarditis.   ? Family history of anesthesia complication   ? "my mom has a hard time coming out from under"  ? Fibromyalgia   ? GERD (gastroesophageal reflux disease)   ? occ  ? Heart murmur   ? Herpes simplex type 1 infection 01/16/2018  ? History of blood transfusion   ? "just low blood count" (05/13/2013)  ? Hyperlipidemia   ? Hypertension   ? Hypothyroidism   ? Hypothyroidism, adult 03/21/2014  ? Hypoxia   ? Leukocytosis   ? Necrosis ([place])   ? and ulceration  ? Necrotizing fasciitis s/p OR debridements 07/06/2012  ? Obesity   ? OSA on CPAP   ? does not wear all the time  ? Peripheral neuropathy   ? Pneumonia   ? Severe protein-calorie malnutrition ([place])   ? Type II diabetes mellitus ([place])   ? Type   II  ? Wound dehiscence 09/04/2019  ? ? ?Current Outpatient Medications on File Prior to Visit  ?Medication Sig Dispense Refill  ? allopurinol (ZYLOPRIM) 100 MG tablet Take 1 tablet by mouth 3 times a week. Take only after dialysis sessions. Do not take on non-dialysis days. 30 tablet 1  ? amitriptyline (ELAVIL) 100 MG tablet Take 100 mg by mouth at bedtime.    ? amLODipine (NORVASC) 10 MG tablet Take 1 tablet (10 mg total) by mouth daily.    ? atorvastatin (LIPITOR) 20 MG tablet TAKE 1 TABLET BY MOUTH ONCE DAILY (Patient taking differently: Take 20 mg by mouth daily.) 90 tablet 1  ? B Complex-C-Folic Acid (DIALYVITE 195) 0.8 MG TABS Take 0.8 mg by mouth daily.    ? bismuth subsalicylate (PEPTO BISMOL) 262 MG/15ML suspension Take 30 mLs by mouth every 6 (six) hours as needed for indigestion or diarrhea or loose stools.    ? blood glucose meter kit and supplies KIT ICD 10- E11.69. Based on patient and insurance preference. Use up to four times daily as directed. 1 each 0  ? buPROPion (WELLBUTRIN XL) 150 MG 24 hr tablet Take 1 tablet (150 mg total) by  mouth every morning. 90 tablet 3  ? calcium acetate (PHOSLO) 667 MG capsule Take 1 capsule (667 mg total) by mouth 3 (three) times daily with meals. 90 capsule 1  ? diclofenac Sodium (VOLTAREN) 1 % GEL APPLY 1 GRAM TO AFFECTED AREA TOPICALLY 4 TIMES DAILY AS NEEDED 800 g 2  ? folic acid-vitamin b complex-vitamin c-selenium-zinc (DIALYVITE) 3 MG TABS tablet Take 1 tablet by mouth daily.    ? levothyroxine (SYNTHROID) 88 MCG tablet Take 88 mcg by mouth daily.    ? metoprolol succinate (TOPROL-XL) 25 MG 24 hr tablet TAKE 1 TABLET BY MOUTH EVERY DAY 90 tablet 1  ? mupirocin ointment (BACTROBAN) 2 % APPLY TO AFFECTED AREA(S) DAILY AS NEEDED 22 g 0  ? sevelamer carbonate (RENVELA) 800 MG tablet Take 1,600 mg by mouth 3 (three) times daily with meals.    ? TRUEPLUS PEN NEEDLES 31G X 5 MM MISC Use pen needle with insulin 3 times daily (Patient taking differently: 1 each by Other  route 3 (three) times daily.) 100 each 5  ? ?No current facility-administered medications on file prior to visit.  ? ? ?Family History  ?Problem Relation Age of Onset  ? Heart failure Mother   ? Diabetes Mother   ? Kidney disease Mother   ? Kidney disease Father   ? Diabetes Father   ? Diabetes Paternal Grandmother   ? Heart failure Paternal Grandmother   ? Other Other   ? ? ?Social History  ? ?Socioeconomic History  ? Marital status: Single  ?  Spouse name: Not on file  ? Number of children: 0  ? Years of education: 104  ? Highest education level: Not on file  ?Occupational History  ? Occupation: disability  ?Tobacco Use  ? Smoking status: Every Day  ?  Packs/day: 0.50  ?  Years: 16.00  ?  Pack years: 8.00  ?  Types: Cigarettes  ?  Last attempt to quit: 07/22/2019  ?  Years since quitting: 2.0  ? Smokeless tobacco: Never  ? Tobacco comments:  ?  Hasn't had any in 3 days  ?Vaping Use  ? Vaping Use: Former  ? Devices: CBD oil  ?Substance and Sexual Activity  ? Alcohol use: Yes  ?  Alcohol/week: 0.0 standard drinks  ?  Comment: social  ? Drug use: Yes  ?  Types: Marijuana  ?  Comment: 1 time a week  ? Sexual activity: Yes  ?Other Topics Concern  ? Not on file  ?Social History Narrative  ? Lives with mother currently, goddaughter and uncle in a one story home.    ? On disability for diabetes, neuropathy, sleep apnea etc.  Disability since 2014.   ? Education: 11th grade.   ? ?Social Determinants of Health  ? ?Financial Resource Strain: Not on file  ?Food Insecurity: Not on file  ?Transportation Needs: Not on file  ?Physical Activity: Not on file  ?Stress: Not on file  ?Social Connections: Not on file  ?Intimate Partner Violence: Not on file  ? ? ?Review of Systems: ?ROS negative except for what is noted on the assessment and plan. ? ?Vitals:  ? 07/21/21 0902 07/21/21 0913  ?BP: (!) 161/73 129/70  ?Pulse: 82 79  ?Temp: 98.2 ?F (36.8 ?C)   ?TempSrc: Oral   ?SpO2: 100%   ?Weight: 271 lb 14.4 oz (123.3 kg)   ?Height: 5' 9.5"  (1.765 m)   ? ?Physical Exam: ?Constitutional: well appearing, NAD ?HENT: normocephalic atraumatic ?Eyes: conjunctiva non-erythematous ?  Neck: supple ?Cardiovascular: regular rate and rhythm, no m/r/g ?Pulmonary/Chest: normal work of breathing on room air ?MSK: normal bulk and tone, left AKA ?Neurological: alert & oriented x 3 ?Skin: right lower extremity with difficulty assessing dorsalis pedis pulse. Tibalis dorsalis 2+ ?Psych: Normal mood and thought process ? ? ?Assessment & Plan:  ? ?See Encounters Tab for problem based charting. ? ?Patient discussed with Dr.  Saverio Danker ? ?Sanjuana Letters, D.O. ?[facility], PGY-2 ?Pager: [phone], Phone: [phone] ?Date 07/21/2021 Time 3:42 PM  ?

## 2021-07-21 NOTE — Assessment & Plan Note (Signed)
BP well controlled today on amlodipine 10 mg daily and toprolol 25 mg q24h. She notes that they are still trying to find her dry weight at dialysis. Continue current regimen. Also adding on diuretic therapy to take on non-dialysis days.  ?

## 2021-07-21 NOTE — Assessment & Plan Note (Signed)
Currently doing well in regards to her dialysis sessions. She asked that we start her on diuretic therapy.  She notes that yesterday during her dialysis session the provider caring for her and mentioned that she did discuss with [place] starting a new diuretic as Bumex no longer working.  We will start Lasix 20 mg on nondialysis days.  Can be adjusted and titrated per her nephrology team. ?

## 2021-07-21 NOTE — Assessment & Plan Note (Addendum)
Currently on levothyroxine 80 mg daily.  We will recheck TSH levels today as I have not been checked in around a year.  Addendum: TSH elevated at 5. Has been on levothryoxine in the past, however, this was to be discontinued at her last visit. Difficult to interpret results with her on biotin supplementation on her med list. Will discuss if she still taking taking either of these medications. If still on biotic, will have her hold and repeat TSH in a few weeks.   Patient has continued to to take her levothyroxin. Will have her hold the biotin and recheck levels to make sure she does not need adjustments

## 2021-07-21 NOTE — Assessment & Plan Note (Addendum)
Foot exam performed today.  A1c repeated. Patient also of age for colonoscopy, would prefer FOBT. Patient given FOBT kit by lab and instructed to bring back to clinic.  ?

## 2021-07-22 ENCOUNTER — Telehealth: Payer: Self-pay

## 2021-07-22 DIAGNOSIS — N186 End stage renal disease: Secondary | ICD-10-CM | POA: Diagnosis not present

## 2021-07-22 DIAGNOSIS — Z992 Dependence on renal dialysis: Secondary | ICD-10-CM | POA: Diagnosis not present

## 2021-07-22 DIAGNOSIS — N2581 Secondary hyperparathyroidism of renal origin: Secondary | ICD-10-CM | POA: Diagnosis not present

## 2021-07-22 DIAGNOSIS — Z7689 Persons encountering health services in other specified circumstances: Secondary | ICD-10-CM | POA: Diagnosis not present

## 2021-07-22 LAB — HEMOGLOBIN A1C
Est. average glucose Bld gHb Est-mCnc: 174 mg/dL
Hgb A1c MFr Bld: 7.7 % — ABNORMAL HIGH (ref 4.8–5.6)

## 2021-07-22 LAB — TSH: TSH: 5.08 u[IU]/mL — ABNORMAL HIGH (ref 0.450–4.500)

## 2021-07-22 NOTE — Progress Notes (Signed)
Internal Medicine Clinic Attending  Case discussed with Dr. Katsadouros  At the time of the visit.  We reviewed the resident's history and exam and pertinent patient test results.  I agree with the assessment, diagnosis, and plan of care documented in the resident's note.  

## 2021-07-22 NOTE — Telephone Encounter (Signed)
I have faxed DME order,office notes for this patient to [facility].The office number is [phone] fax number is [phone] Silverio Decamp C3/2/20239:29 AM ? ?

## 2021-07-24 DIAGNOSIS — Z7689 Persons encountering health services in other specified circumstances: Secondary | ICD-10-CM | POA: Diagnosis not present

## 2021-07-24 DIAGNOSIS — Z992 Dependence on renal dialysis: Secondary | ICD-10-CM | POA: Diagnosis not present

## 2021-07-24 DIAGNOSIS — N186 End stage renal disease: Secondary | ICD-10-CM | POA: Diagnosis not present

## 2021-07-24 DIAGNOSIS — N2581 Secondary hyperparathyroidism of renal origin: Secondary | ICD-10-CM | POA: Diagnosis not present

## 2021-07-27 DIAGNOSIS — Z992 Dependence on renal dialysis: Secondary | ICD-10-CM | POA: Diagnosis not present

## 2021-07-27 DIAGNOSIS — N186 End stage renal disease: Secondary | ICD-10-CM | POA: Diagnosis not present

## 2021-07-27 DIAGNOSIS — N2581 Secondary hyperparathyroidism of renal origin: Secondary | ICD-10-CM | POA: Diagnosis not present

## 2021-07-28 NOTE — Addendum Note (Signed)
Addended by: Riesa Pope on: 07/28/2021 09:50 AM ? ? Modules accepted: Orders ? ?

## 2021-07-29 DIAGNOSIS — Z992 Dependence on renal dialysis: Secondary | ICD-10-CM | POA: Diagnosis not present

## 2021-07-29 DIAGNOSIS — N2581 Secondary hyperparathyroidism of renal origin: Secondary | ICD-10-CM | POA: Diagnosis not present

## 2021-07-29 DIAGNOSIS — N186 End stage renal disease: Secondary | ICD-10-CM | POA: Diagnosis not present

## 2021-07-31 DIAGNOSIS — N186 End stage renal disease: Secondary | ICD-10-CM | POA: Diagnosis not present

## 2021-07-31 DIAGNOSIS — N2581 Secondary hyperparathyroidism of renal origin: Secondary | ICD-10-CM | POA: Diagnosis not present

## 2021-07-31 DIAGNOSIS — Z992 Dependence on renal dialysis: Secondary | ICD-10-CM | POA: Diagnosis not present

## 2021-08-02 ENCOUNTER — Other Ambulatory Visit (INDEPENDENT_AMBULATORY_CARE_PROVIDER_SITE_OTHER): Payer: Medicare HMO

## 2021-08-02 DIAGNOSIS — E039 Hypothyroidism, unspecified: Secondary | ICD-10-CM | POA: Diagnosis not present

## 2021-08-02 LAB — GLUCOSE, CAPILLARY: Glucose-Capillary: 218 mg/dL — ABNORMAL HIGH (ref 70–99)

## 2021-08-03 DIAGNOSIS — N2581 Secondary hyperparathyroidism of renal origin: Secondary | ICD-10-CM | POA: Diagnosis not present

## 2021-08-03 DIAGNOSIS — Z992 Dependence on renal dialysis: Secondary | ICD-10-CM | POA: Diagnosis not present

## 2021-08-03 DIAGNOSIS — N186 End stage renal disease: Secondary | ICD-10-CM | POA: Diagnosis not present

## 2021-08-03 LAB — T4, FREE: Free T4: 1.3 ng/dL (ref 0.82–1.77)

## 2021-08-05 ENCOUNTER — Institutional Professional Consult (permissible substitution): Payer: Medicare HMO | Admitting: Behavioral Health

## 2021-08-05 DIAGNOSIS — N2581 Secondary hyperparathyroidism of renal origin: Secondary | ICD-10-CM | POA: Diagnosis not present

## 2021-08-05 DIAGNOSIS — Z992 Dependence on renal dialysis: Secondary | ICD-10-CM | POA: Diagnosis not present

## 2021-08-05 DIAGNOSIS — N186 End stage renal disease: Secondary | ICD-10-CM | POA: Diagnosis not present

## 2021-08-07 DIAGNOSIS — Z992 Dependence on renal dialysis: Secondary | ICD-10-CM | POA: Diagnosis not present

## 2021-08-07 DIAGNOSIS — N2581 Secondary hyperparathyroidism of renal origin: Secondary | ICD-10-CM | POA: Diagnosis not present

## 2021-08-07 DIAGNOSIS — N186 End stage renal disease: Secondary | ICD-10-CM | POA: Diagnosis not present

## 2021-08-09 ENCOUNTER — Ambulatory Visit: Payer: Medicare HMO | Attending: Student in an Organized Health Care Education/Training Program

## 2021-08-09 ENCOUNTER — Ambulatory Visit: Payer: Medicare HMO | Admitting: Behavioral Health

## 2021-08-09 ENCOUNTER — Other Ambulatory Visit: Payer: Self-pay

## 2021-08-09 VITALS — BP 162/78

## 2021-08-09 DIAGNOSIS — R293 Abnormal posture: Secondary | ICD-10-CM | POA: Insufficient documentation

## 2021-08-09 DIAGNOSIS — R2681 Unsteadiness on feet: Secondary | ICD-10-CM | POA: Insufficient documentation

## 2021-08-09 DIAGNOSIS — M6281 Muscle weakness (generalized): Secondary | ICD-10-CM | POA: Insufficient documentation

## 2021-08-09 DIAGNOSIS — R2689 Other abnormalities of gait and mobility: Secondary | ICD-10-CM | POA: Diagnosis not present

## 2021-08-09 DIAGNOSIS — Z89612 Acquired absence of left leg above knee: Secondary | ICD-10-CM | POA: Diagnosis not present

## 2021-08-09 DIAGNOSIS — F331 Major depressive disorder, recurrent, moderate: Secondary | ICD-10-CM

## 2021-08-09 DIAGNOSIS — M25561 Pain in right knee: Secondary | ICD-10-CM | POA: Insufficient documentation

## 2021-08-09 DIAGNOSIS — G8929 Other chronic pain: Secondary | ICD-10-CM | POA: Diagnosis not present

## 2021-08-09 DIAGNOSIS — F419 Anxiety disorder, unspecified: Secondary | ICD-10-CM

## 2021-08-09 NOTE — Therapy (Signed)
? ?OUTPATIENT PHYSICAL THERAPY PROSTHETICS EVALUATION ? ? ?Patient Name: Hilton Sinclair Turner ?MRN: [id number] ?DOB:1975-08-24, 46 y.o., female ?Today's Date: 08/09/2021 ? ?PCP: Riesa Pope, MD ?REFERRING PROVIDER: Axel Filler,* ? ? PT End of Session - 08/09/21 4496   ? ? Visit Number 1   ? Number of Visits 17   ? Date for PT Re-Evaluation 10/08/21   ? Authorization Type humana medicare with medicaid secondary. Auth requested   ? PT Start Time [phone]   ? PT Stop Time 1019   ? PT Time Calculation (min) 45 min   ? Activity Tolerance Patient tolerated treatment well   ? Behavior During Therapy [facility] for tasks assessed/performed   ? ?  ?  ? ?  ? ? ?Past Medical History:  ?Diagnosis Date  ? Abdominal muscle pain 09/08/2016  ? Abnormal Pap smear of cervix 2009  ? Abscess   ? history of multiple abscesses  ? Acute bilateral low back pain 02/13/2017  ? Acute blood loss anemia   ? Acute on chronic renal failure ([place]) 07/12/2012  ? Acute renal failure ([place]) 07/12/2012  ? Adjustment disorder with depressed mood 03/26/2017  ? AKI (acute kidney injury) ([place])   ? Anemia 10/21/2019  ? Anemia of chronic disease 2002  ? Anginal pain ([place])   ? Anxiety   ? Panic attacks  ? Bilateral lower extremity edema 05/13/2016  ? Bipolar disorder ([place])   ? Cataract   ? B/L cataract  ? Cellulitis 05/21/2014  ? right eye  ? CHF (congestive heart failure) ([place])   ? Chronic bronchitis ([place])   ? "get it q yr" (05/13/2013)  ? Chronic pain   ? Chronic pain of right knee 09/08/2016  ? Dehiscence of amputation stump (HCC)   ? Depression   ? Edema of lower extremity   ? Endocarditis 2002  ? subacute bacterial endocarditis.   ? Family history of anesthesia complication   ? "my mom has a hard time coming out from [place]"  ? Fibromyalgia   ? GERD (gastroesophageal reflux disease)   ? occ  ? Heart murmur   ? Herpes simplex type 1 infection 01/16/2018  ? History of blood transfusion   ? "just low blood count" (05/13/2013)  ? Hyperlipidemia   ?  Hypertension   ? Hypothyroidism   ? Hypothyroidism, adult 03/21/2014  ? Hypoxia   ? Leukocytosis   ? Necrosis ([place])   ? and ulceration  ? Necrotizing fasciitis s/p OR debridements 07/06/2012  ? Obesity   ? OSA on CPAP   ? does not wear all the time  ? Peripheral neuropathy   ? Pneumonia   ? Severe protein-calorie malnutrition ([place])   ? Type II diabetes mellitus ([place])   ? Type  II  ? Wound dehiscence 09/04/2019  ? ?Past Surgical History:  ?Procedure Laterality Date  ? A/V FISTULAGRAM Left 05/12/2021  ? Procedure: A/V FISTULAGRAM;  Surgeon: Broadus John, MD;  Location: [place] [facility];  Service: Cardiovascular;  Laterality: Left;  ? ABDOMINAL AORTOGRAM W/LOWER EXTREMITY Left 03/19/2019  ? Procedure: ABDOMINAL AORTOGRAM W/LOWER EXTREMITY;  Surgeon: Serafina Mitchell, MD;  Location: [place] [facility];  Service: Cardiovascular;  Laterality: Left;  ? ABOVE KNEE LEG AMPUTATION Left 10/11/2019  ? wound dehisence   ? AIR/FLUID EXCHANGE Right 12/31/2019  ? Procedure: AIR/FLUID EXCHANGE;  Surgeon: Jalene Mullet, MD;  Location: [place];  Service: Ophthalmology;  Laterality: Right;  ? AMPUTATION Left 04/07/2017  ? Procedure: LEFT BELOW KNEE  AMPUTATION;  Surgeon: Newt Minion, MD;  Location: [place];  Service: Orthopedics;  Laterality: Left;  ? AMPUTATION Left 10/11/2019  ? Procedure: LEFT ABOVE KNEE AMPUTATION;  Surgeon: Newt Minion, MD;  Location: [place];  Service: Orthopedics;  Laterality: Left;  ? AV FISTULA PLACEMENT Left 01/05/2021  ? Procedure: ARTERIOVENOUS (AV) FISTULA CREATION vs. GRAFT;  Surgeon: Angelia Mould, MD;  Location: [place];  Service: Vascular;  Laterality: Left;  ? CATARACT EXTRACTION W/PHACO Right 02/11/2020  ? Procedure: CATARACT EXTRACTION PHACO AND INTRAOCULAR LENS PLACEMENT (IOC);  Surgeon: Jalene Mullet, MD;  Location: [place];  Service: Ophthalmology;  Laterality: Right;  ? EYE SURGERY    ? lazer  ? FISTULA SUPERFICIALIZATION Left 02/23/2021  ? Procedure: SUPERFICIALIZATION OF LEFT  BRACHIOCEPHALIC FISTULA;  Surgeon: Angelia Mould, MD;  Location: [place];  Service: Vascular;  Laterality: Left;  ? GAS INSERTION Right 12/31/2019  ? Procedure: INSERTION OF GAS;  Surgeon: Jalene Mullet, MD;  Location: [place];  Service: Ophthalmology;  Laterality: Right;  ? INCISION AND DRAINAGE ABSCESS    ? multiple I&Ds  ? INCISION AND DRAINAGE ABSCESS Left 07/09/2012  ? Procedure: DRESSING CHANGE, THIGH WOUND;  Surgeon: Harl Bowie, MD;  Location: [place];  Service: General;  Laterality: Left;  ? INCISION AND DRAINAGE OF WOUND Left 07/07/2012  ? Procedure: IRRIGATION AND DEBRIDEMENT WOUND;  Surgeon: Harl Bowie, MD;  Location: [place];  Service: General;  Laterality: Left;  ? INCISION AND DRAINAGE PERIRECTAL ABSCESS Left 07/14/2012  ? Procedure: DEBRIDEMENT OF SKIN & SOFT TISSUE; DRESSING CHANGE UNDER ANESTHESIA;  Surgeon: Gayland Curry, MD,FACS;  Location: [place];  Service: General;  Laterality: Left;  ? INCISION AND DRAINAGE PERIRECTAL ABSCESS Left 07/16/2012  ? Procedure: I&D Left Thigh;  Surgeon: Gwenyth Ober, MD;  Location: [place];  Service: General;  Laterality: Left;  ? INCISION AND DRAINAGE PERIRECTAL ABSCESS N/A 01/05/2015  ? Procedure: IRRIGATION AND DEBRIDEMENT PERIRECTAL ABSCESS;  Surgeon: Donnie Mesa, MD;  Location: [place];  Service: General;  Laterality: N/A;  ? INSERTION OF DIALYSIS CATHETER Right 01/05/2021  ? Procedure: INSERTION OF DIALYSIS CATHETER;  Surgeon: Angelia Mould, MD;  Location: [facility];  Service: Vascular;  Laterality: Right;  ? IRRIGATION AND DEBRIDEMENT ABSCESS Left 07/06/2012  ? Procedure: IRRIGATION AND DEBRIDEMENT ABSCESS BUTTOCKS AND THIGH;  Surgeon: Shann Medal, MD;  Location: [place];  Service: General;  Laterality: Left;  ? IRRIGATION AND DEBRIDEMENT ABSCESS Left 08/10/2012  ? Procedure: IRRIGATION AND DEBRIDEMENT ABSCESS;  Surgeon: Madilyn Hook, DO;  Location: [place];  Service: General;  Laterality: Left;  ? PARS PLANA VITRECTOMY Right 12/31/2019  ?  Procedure: PARS PLANA VITRECTOMY WITH 25 GAUGE;  Surgeon: Jalene Mullet, MD;  Location: [place];  Service: Ophthalmology;  Laterality: Right;  ? PERIPHERAL VASCULAR INTERVENTION Left 05/12/2021  ? Procedure: PERIPHERAL VASCULAR INTERVENTION;  Surgeon: Broadus John, MD;  Location: [place] [facility];  Service: Cardiovascular;  Laterality: Left;  arm fistula  ? PHOTOCOAGULATION WITH LASER Right 12/31/2019  ? Procedure: PHOTOCOAGULATION WITH LASER;  Surgeon: Jalene Mullet, MD;  Location: [place];  Service: Ophthalmology;  Laterality: Right;  ? REPAIR OF COMPLEX TRACTION RETINAL DETACHMENT Right 12/31/2019  ? Procedure: REPAIR OF COMPLEX TRACTION RETINAL;  Surgeon: Jalene Mullet, MD;  Location: [place];  Service: Ophthalmology;  Laterality: Right;  ? STUMP REVISION Left 06/09/2017  ? Procedure: REVISION LEFT BELOW KNEE AMPUTATION;  Surgeon: Newt Minion, MD;  Location: [place];  Service: Orthopedics;  Laterality: Left;  ? STUMP REVISION Left 09/04/2019  ? Procedure: LEFT BELOW KNEE AMPUTATION REVISION;  Surgeon: Newt Minion, MD;  Location: [place];  Service: Orthopedics;  Laterality: Left;  ? STUMP REVISION Left 09/20/2019  ? Procedure: LEFT BELOW KNEE AMPUTATION REVISION;  Surgeon: Newt Minion, MD;  Location: [place];  Service: Orthopedics;  Laterality: Left;  ? TONSILLECTOMY  1994  ? ?Patient Active Problem List  ? Diagnosis Date Noted  ? Weak pulse 07/21/2021  ? Hidradenitis suppurativa 02/24/2021  ? Pyelonephritis 12/31/2020  ? Muscle spasm 12/16/2020  ? Cough 05/26/2020  ? Chronic pain syndrome 01/16/2020  ? Hx of AKA (above knee amputation), left ([place])   ? Acute respiratory failure with hypoxia ([place]) 10/21/2019  ? Blurry vision, right eye 10/08/2019  ? Tobacco use 03/01/2019  ? Phantom limb pain ([place]) 11/08/2018  ? Current moderate episode of major depressive disorder ([place])   ? Hyperkalemia   ? Anemia of chronic disease   ? Fall   ? Status post below knee amputation, left ([place]) 04/11/2017  ? Bipolar  affective disorder ([place])   ? Heart failure with preserved ejection fraction ([place]) 02/09/2017  ? Routine adult health maintenance 12/08/2016  ? End-stage renal disease on hemodialysis ([place]) 07/15/2016  ? OSA (obstru

## 2021-08-09 NOTE — BH Specialist Note (Addendum)
Integrated Behavioral Health via Telemedicine Visit ? ?08/09/2021 ?ROLAND PRINE ?[id number] ? ?Number of [place] Clinician visits:  ?Session Start time: 1315 ?Session End time: 5427 ?Total time in minutes: 30 min ? ?Referring Provider: Dr. Morrison Old, DO ?Patient/Family location: Pt is home in private ?[facility] Provider location: [facility] Office ?All persons participating in visit: Pt & Clinician ?Types of Service: Individual psychotherapy ? ?I connected with Stephannie C Minter and/or Stephannie C Hun's  self  via  Telephone or Video Enabled Telemedicine Application  (Video is Caregility application) and verified that I am speaking with the correct person using two identifiers. Discussed confidentiality: Yes  ? ?I discussed the limitations of telemedicine and the availability of in person appointments.  Discussed there is a possibility of technology failure and discussed alternative modes of communication if that failure occurs. ? ?I discussed that engaging in this telemedicine visit, they consent to the provision of behavioral healthcare and the services will be billed under their insurance. ? ?Patient and/or legal guardian expressed understanding and consented to Telemedicine visit: Yes  ? ?Presenting Concerns: ?Patient and/or family reports the following symptoms/concerns: elevated anx/dep due to her financial situation & caring for her "adopted Dtr" Charlett Blake who is in the legal custody of Pt's Aunt ?Duration of problem: for years; Severity of problem: moderate ? ?Patient and/or Family's Strengths/Protective Factors: ?Social and Emotional competence, Sense of purpose, Physical Health (exercise, healthy diet, medication compliance, etc.), Caregiver has knowledge of parenting & child development, and Parental Resilience ? ?Goals Addressed: ?Patient will: ? Reduce symptoms of: anxiety, depression, and stress  ? Increase knowledge and/or ability of: coping skills, healthy habits,  self-management skills, and stress reduction  ? Demonstrate ability to: Increase healthy adjustment to current life circumstances, Increase adequate support systems for patient/family, and Begin healthy grieving over loss ? ?Progress towards Goals: ?Ongoing ? ?Interventions: ?Interventions utilized:  Solution-Focused Strategies and Supportive Counseling ?Standardized Assessments completed:  screeners prn ? ?Patient and/or Family Response: Pt is receptive to call today, RC to provide correct contact number. Pt requests future appts ? ?Assessment: ?Patient currently experiencing elevated anx/dep due to recent need for Dialysis Tx thrice wkly; Px & water therapy. Pt was scared by [facility]. Her Eviction Notice @ the end of the year was more severe w/panic attack rxn. She is in the midst of trying to control her anx/dep.  ? ?Pt has quit smoking w/the help of her Wellbutrin prescription.  ? ?Patient may benefit from cont'd Cslg. ? ?Plan: ?Follow up with behavioral health clinician on : 3 wks for 60 min on telehealth ?Behavioral recommendations: Write in notebook @ Dialysis to process feelings ?Referral(s): [place] (In Clinic) ?Explore [facility] for your Atty needs for the Legal Custody of Charlett Blake (46yo Pt has raised her whole life) ? ?I discussed the assessment and treatment plan with the patient and/or parent/guardian. They were provided an opportunity to ask questions and all were answered. They agreed with the plan and demonstrated an understanding of the instructions. ?  ?They were advised to call back or seek an in-person evaluation if the symptoms worsen or if the condition fails to improve as anticipated. ? ?[name], LMFT ?

## 2021-08-10 DIAGNOSIS — Z992 Dependence on renal dialysis: Secondary | ICD-10-CM | POA: Diagnosis not present

## 2021-08-10 DIAGNOSIS — N2581 Secondary hyperparathyroidism of renal origin: Secondary | ICD-10-CM | POA: Diagnosis not present

## 2021-08-10 DIAGNOSIS — N186 End stage renal disease: Secondary | ICD-10-CM | POA: Diagnosis not present

## 2021-08-11 ENCOUNTER — Ambulatory Visit: Payer: Medicare HMO

## 2021-08-12 DIAGNOSIS — N2581 Secondary hyperparathyroidism of renal origin: Secondary | ICD-10-CM | POA: Diagnosis not present

## 2021-08-12 DIAGNOSIS — N186 End stage renal disease: Secondary | ICD-10-CM | POA: Diagnosis not present

## 2021-08-12 DIAGNOSIS — Z992 Dependence on renal dialysis: Secondary | ICD-10-CM | POA: Diagnosis not present

## 2021-08-14 DIAGNOSIS — Z992 Dependence on renal dialysis: Secondary | ICD-10-CM | POA: Diagnosis not present

## 2021-08-14 DIAGNOSIS — N186 End stage renal disease: Secondary | ICD-10-CM | POA: Diagnosis not present

## 2021-08-14 DIAGNOSIS — N2581 Secondary hyperparathyroidism of renal origin: Secondary | ICD-10-CM | POA: Diagnosis not present

## 2021-08-16 ENCOUNTER — Ambulatory Visit (HOSPITAL_COMMUNITY): Payer: Medicare HMO

## 2021-08-17 DIAGNOSIS — Z992 Dependence on renal dialysis: Secondary | ICD-10-CM | POA: Diagnosis not present

## 2021-08-17 DIAGNOSIS — N2581 Secondary hyperparathyroidism of renal origin: Secondary | ICD-10-CM | POA: Diagnosis not present

## 2021-08-17 DIAGNOSIS — N186 End stage renal disease: Secondary | ICD-10-CM | POA: Diagnosis not present

## 2021-08-18 ENCOUNTER — Ambulatory Visit (HOSPITAL_COMMUNITY)
Admission: RE | Admit: 2021-08-18 | Discharge: 2021-08-18 | Disposition: A | Discharge status: Home / Self Care | Payer: Medicare HMO | Source: Ambulatory Visit | Attending: Student in an Organized Health Care Education/Training Program | Admitting: Student in an Organized Health Care Education/Training Program

## 2021-08-18 ENCOUNTER — Ambulatory Visit: Payer: Medicare HMO | Admitting: Physical Therapy

## 2021-08-18 ENCOUNTER — Encounter: Payer: Self-pay | Admitting: Physical Therapy

## 2021-08-18 ENCOUNTER — Other Ambulatory Visit: Payer: Self-pay

## 2021-08-18 DIAGNOSIS — G8929 Other chronic pain: Secondary | ICD-10-CM | POA: Diagnosis not present

## 2021-08-18 DIAGNOSIS — R293 Abnormal posture: Secondary | ICD-10-CM

## 2021-08-18 DIAGNOSIS — R2681 Unsteadiness on feet: Secondary | ICD-10-CM | POA: Diagnosis not present

## 2021-08-18 DIAGNOSIS — Z89612 Acquired absence of left leg above knee: Secondary | ICD-10-CM | POA: Diagnosis not present

## 2021-08-18 DIAGNOSIS — R0989 Other specified symptoms and signs involving the circulatory and respiratory systems: Secondary | ICD-10-CM

## 2021-08-18 DIAGNOSIS — M6281 Muscle weakness (generalized): Secondary | ICD-10-CM | POA: Diagnosis not present

## 2021-08-18 DIAGNOSIS — M25561 Pain in right knee: Secondary | ICD-10-CM | POA: Diagnosis not present

## 2021-08-18 DIAGNOSIS — R2689 Other abnormalities of gait and mobility: Secondary | ICD-10-CM | POA: Diagnosis not present

## 2021-08-18 NOTE — Progress Notes (Signed)
ABI has been completed.  ? ?Preliminary results in CV Proc.  ? ?Giorgia Wahler Karie Skowron ?08/18/2021 3:54 PM    ?

## 2021-08-18 NOTE — Therapy (Signed)
?OUTPATIENT PHYSICAL THERAPY TREATMENT NOTE ? ? ?Patient Name: [name] [name] ?MRN: [id number] ?DOB:Mar 15, 1976, 46 y.o., female ?Today's Date: 08/18/2021 ? ?PCP: Riesa Pope, MD ?REFERRING PROVIDER: Axel Filler,* ? ? PT End of Session - 08/18/21 0940   ? ? Visit Number 2   ? Number of Visits 17   ? Date for PT Re-Evaluation 10/08/21   ? Authorization Type humana medicare with medicaid secondary. Auth requested   ? Authorization Time Period 17 visits 08/09/21-10/08/21   ? Authorization - Number of Visits 17   ? Progress Note Due on Visit 10   ? PT Start Time 7741   ? PT Stop Time 1015   ? PT Time Calculation (min) 40 min   ? Equipment Utilized During Treatment Gait belt   ? Activity Tolerance Patient tolerated treatment well   ? Behavior During Therapy [facility] for tasks assessed/performed   ? ?  ?  ? ?  ? ? ?Past Medical History:  ?Diagnosis Date  ? Abdominal muscle pain 09/08/2016  ? Abnormal Pap smear of cervix 2009  ? Abscess   ? history of multiple abscesses  ? Acute bilateral low back pain 02/13/2017  ? Acute blood loss anemia   ? Acute on chronic renal failure ([place]) 07/12/2012  ? Acute renal failure ([place]) 07/12/2012  ? Adjustment disorder with depressed mood 03/26/2017  ? AKI (acute kidney injury) ([place])   ? Anemia 10/21/2019  ? Anemia of chronic disease 2002  ? Anginal pain ([place])   ? Anxiety   ? Panic attacks  ? Bilateral lower extremity edema 05/13/2016  ? Bipolar disorder ([place])   ? Cataract   ? B/L cataract  ? Cellulitis 05/21/2014  ? right eye  ? CHF (congestive heart failure) ([place])   ? Chronic bronchitis ([place])   ? "get it q yr" (05/13/2013)  ? Chronic pain   ? Chronic pain of right knee 09/08/2016  ? Dehiscence of amputation stump (HCC)   ? Depression   ? Edema of lower extremity   ? Endocarditis 2002  ? subacute bacterial endocarditis.   ? Family history of anesthesia complication   ? "my mom has a hard time coming out from [place]"  ? Fibromyalgia   ? GERD (gastroesophageal reflux disease)    ? occ  ? Heart murmur   ? Herpes simplex type 1 infection 01/16/2018  ? History of blood transfusion   ? "just low blood count" (05/13/2013)  ? Hyperlipidemia   ? Hypertension   ? Hypothyroidism   ? Hypothyroidism, adult 03/21/2014  ? Hypoxia   ? Leukocytosis   ? Necrosis ([place])   ? and ulceration  ? Necrotizing fasciitis s/p OR debridements 07/06/2012  ? Obesity   ? OSA on CPAP   ? does not wear all the time  ? Peripheral neuropathy   ? Pneumonia   ? Severe protein-calorie malnutrition ([place])   ? Type II diabetes mellitus ([place])   ? Type  II  ? Wound dehiscence 09/04/2019  ? ?Past Surgical History:  ?Procedure Laterality Date  ? A/V FISTULAGRAM Left 05/12/2021  ? Procedure: A/V FISTULAGRAM;  Surgeon: Broadus John, MD;  Location: [place] [facility];  Service: Cardiovascular;  Laterality: Left;  ? ABDOMINAL AORTOGRAM W/LOWER EXTREMITY Left 03/19/2019  ? Procedure: ABDOMINAL AORTOGRAM W/LOWER EXTREMITY;  Surgeon: Serafina Mitchell, MD;  Location: [place] [facility];  Service: Cardiovascular;  Laterality: Left;  ? ABOVE KNEE LEG AMPUTATION Left 10/11/2019  ? wound dehisence   ? AIR/FLUID  EXCHANGE Right 12/31/2019  ? Procedure: AIR/FLUID EXCHANGE;  Surgeon: Jalene Mullet, MD;  Location: [place];  Service: Ophthalmology;  Laterality: Right;  ? AMPUTATION Left 04/07/2017  ? Procedure: LEFT BELOW KNEE AMPUTATION;  Surgeon: Newt Minion, MD;  Location: [place];  Service: Orthopedics;  Laterality: Left;  ? AMPUTATION Left 10/11/2019  ? Procedure: LEFT ABOVE KNEE AMPUTATION;  Surgeon: Newt Minion, MD;  Location: [place];  Service: Orthopedics;  Laterality: Left;  ? AV FISTULA PLACEMENT Left 01/05/2021  ? Procedure: ARTERIOVENOUS (AV) FISTULA CREATION vs. GRAFT;  Surgeon: Angelia Mould, MD;  Location: [place];  Service: Vascular;  Laterality: Left;  ? CATARACT EXTRACTION W/PHACO Right 02/11/2020  ? Procedure: CATARACT EXTRACTION PHACO AND INTRAOCULAR LENS PLACEMENT (IOC);  Surgeon: Jalene Mullet, MD;  Location: [place];  Service: Ophthalmology;  Laterality: Right;  ? EYE SURGERY    ? lazer  ? FISTULA SUPERFICIALIZATION Left 02/23/2021  ? Procedure: SUPERFICIALIZATION OF LEFT BRACHIOCEPHALIC FISTULA;  Surgeon: Angelia Mould, MD;  Location: [place];  Service: Vascular;  Laterality: Left;  ? GAS INSERTION Right 12/31/2019  ? Procedure: INSERTION OF GAS;  Surgeon: Jalene Mullet, MD;  Location: [place];  Service: Ophthalmology;  Laterality: Right;  ? INCISION AND DRAINAGE ABSCESS    ? multiple I&Ds  ? INCISION AND DRAINAGE ABSCESS Left 07/09/2012  ? Procedure: DRESSING CHANGE, THIGH WOUND;  Surgeon: Harl Bowie, MD;  Location: [place];  Service: General;  Laterality: Left;  ? INCISION AND DRAINAGE OF WOUND Left 07/07/2012  ? Procedure: IRRIGATION AND DEBRIDEMENT WOUND;  Surgeon: Harl Bowie, MD;  Location: [place];  Service: General;  Laterality: Left;  ? INCISION AND DRAINAGE PERIRECTAL ABSCESS Left 07/14/2012  ? Procedure: DEBRIDEMENT OF SKIN & SOFT TISSUE; DRESSING CHANGE UNDER ANESTHESIA;  Surgeon: Gayland Curry, MD,FACS;  Location: [place];  Service: General;  Laterality: Left;  ? INCISION AND DRAINAGE PERIRECTAL ABSCESS Left 07/16/2012  ? Procedure: I&D Left Thigh;  Surgeon: Gwenyth Ober, MD;  Location: [place];  Service: General;  Laterality: Left;  ? INCISION AND DRAINAGE PERIRECTAL ABSCESS N/A 01/05/2015  ? Procedure: IRRIGATION AND DEBRIDEMENT PERIRECTAL ABSCESS;  Surgeon: Donnie Mesa, MD;  Location: [place];  Service: General;  Laterality: N/A;  ? INSERTION OF DIALYSIS CATHETER Right 01/05/2021  ? Procedure: INSERTION OF DIALYSIS CATHETER;  Surgeon: Angelia Mould, MD;  Location: [facility];  Service: Vascular;  Laterality: Right;  ? IRRIGATION AND DEBRIDEMENT ABSCESS Left 07/06/2012  ? Procedure: IRRIGATION AND DEBRIDEMENT ABSCESS BUTTOCKS AND THIGH;  Surgeon: Shann Medal, MD;  Location: [place];  Service: General;  Laterality: Left;  ? IRRIGATION AND DEBRIDEMENT ABSCESS Left 08/10/2012  ? Procedure:  IRRIGATION AND DEBRIDEMENT ABSCESS;  Surgeon: Madilyn Hook, DO;  Location: [place];  Service: General;  Laterality: Left;  ? PARS PLANA VITRECTOMY Right 12/31/2019  ? Procedure: PARS PLANA VITRECTOMY WITH 25 GAUGE;  Surgeon: Jalene Mullet, MD;  Location: [place];  Service: Ophthalmology;  Laterality: Right;  ? PERIPHERAL VASCULAR INTERVENTION Left 05/12/2021  ? Procedure: PERIPHERAL VASCULAR INTERVENTION;  Surgeon: Broadus John, MD;  Location: [place] [facility];  Service: Cardiovascular;  Laterality: Left;  arm fistula  ? PHOTOCOAGULATION WITH LASER Right 12/31/2019  ? Procedure: PHOTOCOAGULATION WITH LASER;  Surgeon: Jalene Mullet, MD;  Location: [place];  Service: Ophthalmology;  Laterality: Right;  ? REPAIR OF COMPLEX TRACTION RETINAL DETACHMENT Right 12/31/2019  ? Procedure: REPAIR OF COMPLEX TRACTION RETINAL;  Surgeon: Jalene Mullet, MD;  Location: [place] [facility];  Service: Ophthalmology;  Laterality: Right;  ? STUMP REVISION Left 06/09/2017  ? Procedure: REVISION LEFT BELOW KNEE AMPUTATION;  Surgeon: Newt Minion, MD;  Location: [place];  Service: Orthopedics;  Laterality: Left;  ? STUMP REVISION Left 09/04/2019  ? Procedure: LEFT BELOW KNEE AMPUTATION REVISION;  Surgeon: Newt Minion, MD;  Location: [place];  Service: Orthopedics;  Laterality: Left;  ? STUMP REVISION Left 09/20/2019  ? Procedure: LEFT BELOW KNEE AMPUTATION REVISION;  Surgeon: Newt Minion, MD;  Location: [place];  Service: Orthopedics;  Laterality: Left;  ? TONSILLECTOMY  1994  ? ?Patient Active Problem List  ? Diagnosis Date Noted  ? Weak pulse 07/21/2021  ? Hidradenitis suppurativa 02/24/2021  ? Pyelonephritis 12/31/2020  ? Muscle spasm 12/16/2020  ? Cough 05/26/2020  ? Chronic pain syndrome 01/16/2020  ? Hx of AKA (above knee amputation), left ([place])   ? Acute respiratory failure with hypoxia ([place]) 10/21/2019  ? Blurry vision, right eye 10/08/2019  ? Tobacco use 03/01/2019  ? Phantom limb pain ([place]) 11/08/2018  ? Current moderate episode of  major depressive disorder ([place])   ? Hyperkalemia   ? Anemia of chronic disease   ? Fall   ? Status post below knee amputation, left ([place]) 04/11/2017  ? Bipolar affective disorder ([place])   ? Heart failure with

## 2021-08-19 ENCOUNTER — Encounter: Payer: Self-pay | Admitting: Student

## 2021-08-19 DIAGNOSIS — Z992 Dependence on renal dialysis: Secondary | ICD-10-CM | POA: Diagnosis not present

## 2021-08-19 DIAGNOSIS — N2581 Secondary hyperparathyroidism of renal origin: Secondary | ICD-10-CM | POA: Diagnosis not present

## 2021-08-19 DIAGNOSIS — N186 End stage renal disease: Secondary | ICD-10-CM | POA: Diagnosis not present

## 2021-08-20 ENCOUNTER — Ambulatory Visit: Payer: Medicare HMO

## 2021-08-20 DIAGNOSIS — R2681 Unsteadiness on feet: Secondary | ICD-10-CM

## 2021-08-20 DIAGNOSIS — R2689 Other abnormalities of gait and mobility: Secondary | ICD-10-CM | POA: Diagnosis not present

## 2021-08-20 DIAGNOSIS — Z89612 Acquired absence of left leg above knee: Secondary | ICD-10-CM | POA: Diagnosis not present

## 2021-08-20 DIAGNOSIS — Z992 Dependence on renal dialysis: Secondary | ICD-10-CM | POA: Diagnosis not present

## 2021-08-20 DIAGNOSIS — G8929 Other chronic pain: Secondary | ICD-10-CM | POA: Diagnosis not present

## 2021-08-20 DIAGNOSIS — M25561 Pain in right knee: Secondary | ICD-10-CM | POA: Diagnosis not present

## 2021-08-20 DIAGNOSIS — M6281 Muscle weakness (generalized): Secondary | ICD-10-CM | POA: Diagnosis not present

## 2021-08-20 DIAGNOSIS — R293 Abnormal posture: Secondary | ICD-10-CM | POA: Diagnosis not present

## 2021-08-20 DIAGNOSIS — N186 End stage renal disease: Secondary | ICD-10-CM | POA: Diagnosis not present

## 2021-08-20 DIAGNOSIS — E1122 Type 2 diabetes mellitus with diabetic chronic kidney disease: Secondary | ICD-10-CM | POA: Diagnosis not present

## 2021-08-20 NOTE — Therapy (Signed)
?OUTPATIENT PHYSICAL THERAPY TREATMENT NOTE ? ? ?Patient Name: [name] [name] ?MRN: [id number] ?DOB:[birth year], 46 y.o., female ?Today's Date: 08/20/2021 ? ?PCP: Riesa Pope, MD ?REFERRING PROVIDER: Axel Filler,* ? ? PT End of Session - 08/20/21 1012   ? ? Visit Number 3   ? Number of Visits 17   ? Date for PT Re-Evaluation 10/08/21   ? Authorization Type humana medicare with medicaid secondary. Auth requested   ? Authorization Time Period 17 visits 08/09/21-10/08/21   ? Authorization - Visit Number 3   ? Authorization - Number of Visits 17   ? Progress Note Due on Visit 10   ? PT Start Time 1012   ? PT Stop Time 1055   ? PT Time Calculation (min) 43 min   ? Equipment Utilized During Treatment Gait belt   ? Activity Tolerance Patient tolerated treatment well   ? Behavior During Therapy [facility] for tasks assessed/performed   ? ?  ?  ? ?  ? ? ?Past Medical History:  ?Diagnosis Date  ? Abdominal muscle pain 09/08/2016  ? Abnormal Pap smear of cervix 2009  ? Abscess   ? history of multiple abscesses  ? Acute bilateral low back pain 02/13/2017  ? Acute blood loss anemia   ? Acute on chronic renal failure ([place]) 07/12/2012  ? Acute renal failure ([place]) 07/12/2012  ? Adjustment disorder with depressed mood 03/26/2017  ? AKI (acute kidney injury) ([place])   ? Anemia 10/21/2019  ? Anemia of chronic disease 2002  ? Anginal pain ([place])   ? Anxiety   ? Panic attacks  ? Bilateral lower extremity edema 05/13/2016  ? Bipolar disorder ([place])   ? Cataract   ? B/L cataract  ? Cellulitis 05/21/2014  ? right eye  ? CHF (congestive heart failure) ([place])   ? Chronic bronchitis ([place])   ? "get it q yr" (05/13/2013)  ? Chronic pain   ? Chronic pain of right knee 09/08/2016  ? Dehiscence of amputation stump (HCC)   ? Depression   ? Edema of lower extremity   ? Endocarditis 2002  ? subacute bacterial endocarditis.   ? Family history of anesthesia complication   ? "my mom has a hard time coming out from [place]"  ? Fibromyalgia   ? GERD  (gastroesophageal reflux disease)   ? occ  ? Heart murmur   ? Herpes simplex type 1 infection 01/16/2018  ? History of blood transfusion   ? "just low blood count" (05/13/2013)  ? Hyperlipidemia   ? Hypertension   ? Hypothyroidism   ? Hypothyroidism, adult 03/21/2014  ? Hypoxia   ? Leukocytosis   ? Necrosis ([place])   ? and ulceration  ? Necrotizing fasciitis s/p OR debridements 07/06/2012  ? Obesity   ? OSA on CPAP   ? does not wear all the time  ? Peripheral neuropathy   ? Pneumonia   ? Severe protein-calorie malnutrition ([place])   ? Type II diabetes mellitus ([place])   ? Type  II  ? Wound dehiscence 09/04/2019  ? ?Past Surgical History:  ?Procedure Laterality Date  ? A/V FISTULAGRAM Left 05/12/2021  ? Procedure: A/V FISTULAGRAM;  Surgeon: Broadus John, MD;  Location: [place] [facility];  Service: Cardiovascular;  Laterality: Left;  ? ABDOMINAL AORTOGRAM W/LOWER EXTREMITY Left 03/19/2019  ? Procedure: ABDOMINAL AORTOGRAM W/LOWER EXTREMITY;  Surgeon: Serafina Mitchell, MD;  Location: [place] [facility];  Service: Cardiovascular;  Laterality: Left;  ? ABOVE KNEE LEG AMPUTATION Left 10/11/2019  ?  wound dehisence   ? AIR/FLUID EXCHANGE Right 12/31/2019  ? Procedure: AIR/FLUID EXCHANGE;  Surgeon: Jalene Mullet, MD;  Location: [place];  Service: Ophthalmology;  Laterality: Right;  ? AMPUTATION Left 04/07/2017  ? Procedure: LEFT BELOW KNEE AMPUTATION;  Surgeon: Newt Minion, MD;  Location: [place];  Service: Orthopedics;  Laterality: Left;  ? AMPUTATION Left 10/11/2019  ? Procedure: LEFT ABOVE KNEE AMPUTATION;  Surgeon: Newt Minion, MD;  Location: [place];  Service: Orthopedics;  Laterality: Left;  ? AV FISTULA PLACEMENT Left 01/05/2021  ? Procedure: ARTERIOVENOUS (AV) FISTULA CREATION vs. GRAFT;  Surgeon: Angelia Mould, MD;  Location: [place];  Service: Vascular;  Laterality: Left;  ? CATARACT EXTRACTION W/PHACO Right 02/11/2020  ? Procedure: CATARACT EXTRACTION PHACO AND INTRAOCULAR LENS PLACEMENT (IOC);  Surgeon:  Jalene Mullet, MD;  Location: [place];  Service: Ophthalmology;  Laterality: Right;  ? EYE SURGERY    ? lazer  ? FISTULA SUPERFICIALIZATION Left 02/23/2021  ? Procedure: SUPERFICIALIZATION OF LEFT BRACHIOCEPHALIC FISTULA;  Surgeon: Angelia Mould, MD;  [place];  Service: Vascular;  Laterality: Left;  ? GAS INSERTION Right 12/31/2019  ? Procedure: INSERTION OF GAS;  Surgeon: Jalene Mullet, MD;  Location: [place];  Service: Ophthalmology;  Laterality: Right;  ? INCISION AND DRAINAGE ABSCESS    ? multiple I&Ds  ? INCISION AND DRAINAGE ABSCESS Left 07/09/2012  ? Procedure: DRESSING CHANGE, THIGH WOUND;  Surgeon: Harl Bowie, MD;  Location: [place];  Service: General;  Laterality: Left;  ? INCISION AND DRAINAGE OF WOUND Left 07/07/2012  ? Procedure: IRRIGATION AND DEBRIDEMENT WOUND;  Surgeon: Harl Bowie, MD;  Location: [place];  Service: General;  Laterality: Left;  ? INCISION AND DRAINAGE PERIRECTAL ABSCESS Left 07/14/2012  ? Procedure: DEBRIDEMENT OF SKIN & SOFT TISSUE; DRESSING CHANGE UNDER ANESTHESIA;  Surgeon: Gayland Curry, MD,FACS;  Location: [place];  Service: General;  Laterality: Left;  ? INCISION AND DRAINAGE PERIRECTAL ABSCESS Left 07/16/2012  ? Procedure: I&D Left Thigh;  Surgeon: Gwenyth Ober, MD;  Location: [place];  Service: General;  Laterality: Left;  ? INCISION AND DRAINAGE PERIRECTAL ABSCESS N/A 01/05/2015  ? Procedure: IRRIGATION AND DEBRIDEMENT PERIRECTAL ABSCESS;  Surgeon: Donnie Mesa, MD;  Location: [place];  Service: General;  Laterality: N/A;  ? INSERTION OF DIALYSIS CATHETER Right 01/05/2021  ? Procedure: INSERTION OF DIALYSIS CATHETER;  Surgeon: Angelia Mould, MD;  Location: [facility];  Service: Vascular;  Laterality: Right;  ? IRRIGATION AND DEBRIDEMENT ABSCESS Left 07/06/2012  ? Procedure: IRRIGATION AND DEBRIDEMENT ABSCESS BUTTOCKS AND THIGH;  Surgeon: Shann Medal, MD;  Location: [facility];  Service: General;  Laterality: Left;  ? IRRIGATION AND DEBRIDEMENT  ABSCESS Left 08/10/2012  ? Procedure: IRRIGATION AND DEBRIDEMENT ABSCESS;  Surgeon: Madilyn Hook, DO;  Location: [place];  Service: General;  Laterality: Left;  ? PARS PLANA VITRECTOMY Right 12/31/2019  ? Procedure: PARS PLANA VITRECTOMY WITH 25 GAUGE;  Surgeon: Jalene Mullet, MD;  Location: [place];  Service: Ophthalmology;  Laterality: Right;  ? PERIPHERAL VASCULAR INTERVENTION Left 05/12/2021  ? Procedure: PERIPHERAL VASCULAR INTERVENTION;  Surgeon: Broadus John, MD;  Location: [place] [facility];  Service: Cardiovascular;  Laterality: Left;  arm fistula  ? PHOTOCOAGULATION WITH LASER Right 12/31/2019  ? Procedure: PHOTOCOAGULATION WITH LASER;  Surgeon: Jalene Mullet, MD;  Location: [place];  Service: Ophthalmology;  Laterality: Right;  ? REPAIR OF COMPLEX TRACTION RETINAL DETACHMENT Right 12/31/2019  ? Procedure: REPAIR OF COMPLEX TRACTION RETINAL;  Surgeon: Jalene Mullet, MD;  Location: [place];  Service: Ophthalmology;  Laterality: Right;  ? STUMP REVISION Left 06/09/2017  ? Procedure: REVISION LEFT BELOW KNEE AMPUTATION;  Surgeon: Newt Minion, MD;  Location: [place];  Service: Orthopedics;  Laterality: Left;  ? STUMP REVISION Left 09/04/2019  ? Procedure: LEFT BELOW KNEE AMPUTATION REVISION;  Surgeon: Newt Minion, MD;  Location: [place];  Service: Orthopedics;  Laterality: Left;  ? STUMP REVISION Left 09/20/2019  ? Procedure: LEFT BELOW KNEE AMPUTATION REVISION;  Surgeon: Newt Minion, MD;  Location: [place];  Service: Orthopedics;  Laterality: Left;  ? TONSILLECTOMY  1994  ? ?Patient Active Problem List  ? Diagnosis Date Noted  ? Weak pulse 07/21/2021  ? Hidradenitis suppurativa 02/24/2021  ? Pyelonephritis 12/31/2020  ? Muscle spasm 12/16/2020  ? Cough 05/26/2020  ? Chronic pain syndrome 01/16/2020  ? Hx of AKA (above knee amputation), left ([place])   ? Acute respiratory failure with hypoxia ([place]) 10/21/2019  ? Blurry vision, right eye 10/08/2019  ? Tobacco use 03/01/2019  ? Phantom limb pain ([place])  11/08/2018  ? Current moderate episode of major depressive disorder ([place])   ? Hyperkalemia   ? Anemia of chronic disease   ? Fall   ? Status post below knee amputation, left ([place]) 04/11/2017  ? Bipolar affective di

## 2021-08-21 DIAGNOSIS — N186 End stage renal disease: Secondary | ICD-10-CM | POA: Diagnosis not present

## 2021-08-21 DIAGNOSIS — Z992 Dependence on renal dialysis: Secondary | ICD-10-CM | POA: Diagnosis not present

## 2021-08-21 DIAGNOSIS — N2581 Secondary hyperparathyroidism of renal origin: Secondary | ICD-10-CM | POA: Diagnosis not present

## 2021-08-23 ENCOUNTER — Other Ambulatory Visit: Payer: Self-pay | Admitting: Student

## 2021-08-23 ENCOUNTER — Ambulatory Visit: Payer: Medicare HMO | Admitting: Orthopedic Surgery

## 2021-08-23 NOTE — Telephone Encounter (Signed)
Last appointment 05/10/2021. ?

## 2021-08-24 DIAGNOSIS — N2581 Secondary hyperparathyroidism of renal origin: Secondary | ICD-10-CM | POA: Diagnosis not present

## 2021-08-24 DIAGNOSIS — Z992 Dependence on renal dialysis: Secondary | ICD-10-CM | POA: Diagnosis not present

## 2021-08-24 DIAGNOSIS — N186 End stage renal disease: Secondary | ICD-10-CM | POA: Diagnosis not present

## 2021-08-25 ENCOUNTER — Ambulatory Visit (INDEPENDENT_AMBULATORY_CARE_PROVIDER_SITE_OTHER): Payer: Medicare HMO | Admitting: Family

## 2021-08-25 ENCOUNTER — Encounter: Payer: Self-pay | Admitting: Physical Therapy

## 2021-08-25 ENCOUNTER — Ambulatory Visit: Payer: Medicare HMO | Admitting: Family

## 2021-08-25 ENCOUNTER — Ambulatory Visit
Payer: Medicare HMO | Attending: Student in an Organized Health Care Education/Training Program | Admitting: Physical Therapy

## 2021-08-25 DIAGNOSIS — R293 Abnormal posture: Secondary | ICD-10-CM | POA: Insufficient documentation

## 2021-08-25 DIAGNOSIS — M25561 Pain in right knee: Secondary | ICD-10-CM | POA: Insufficient documentation

## 2021-08-25 DIAGNOSIS — R2681 Unsteadiness on feet: Secondary | ICD-10-CM | POA: Insufficient documentation

## 2021-08-25 DIAGNOSIS — G8929 Other chronic pain: Secondary | ICD-10-CM | POA: Diagnosis not present

## 2021-08-25 DIAGNOSIS — Z89612 Acquired absence of left leg above knee: Secondary | ICD-10-CM

## 2021-08-25 DIAGNOSIS — R2689 Other abnormalities of gait and mobility: Secondary | ICD-10-CM | POA: Diagnosis not present

## 2021-08-25 DIAGNOSIS — M6281 Muscle weakness (generalized): Secondary | ICD-10-CM | POA: Diagnosis not present

## 2021-08-25 DIAGNOSIS — M1711 Unilateral primary osteoarthritis, right knee: Secondary | ICD-10-CM | POA: Diagnosis not present

## 2021-08-25 MED ORDER — METHYLPREDNISOLONE ACETATE 40 MG/ML IJ SUSP
40.0000 mg | INTRAMUSCULAR | Status: AC | PRN
  Administered 2021-08-25: 40 mg via INTRA_ARTICULAR

## 2021-08-25 MED ORDER — LIDOCAINE HCL 1 % IJ SOLN
5.0000 mL | INTRAMUSCULAR | Status: AC | PRN
  Administered 2021-08-25: 5 mL

## 2021-08-25 NOTE — Progress Notes (Signed)
? ?Office Visit Note ?  ?Patient: [name] [name]           ?Date of Birth: 12-28-1975           ?MRN: [id number] ?Visit Date: 08/25/2021 ?             ?Requested by: Riesa Pope, MD ?[address] ?[place],  [place] [address] ?PCP: Riesa Pope, MD ? ?Chief Complaint  ?Patient presents with  ? Right Knee - Pain  ?  S/p cortisone injection 01/27/21  ? ? ? ? ?HPI: ?The patient is a 46 year old woman who presents today for 2 separate issues she does have a history of a left above-knee amputation and she states she has been having some skin issues from her socket she wonders if she has an infection in her skin ? ?She is also seen today in routine follow-up for her right knee she has been having significant return of her pain she has been working with physical therapy hopes to get fully ambulatory without her walker her last cortisone injection the right knee was in September of last year ? ?Assessment & Plan: ?Visit Diagnoses: No diagnosis found. ? ?Plan: Discussed the skin breakdown she is having her residual limb is likely from inadequate full contact in her socket she states she already has an appointment with Gerald Stabs on Monday that he is able to modify her socket or make her a new one to alleviate the pressure and movement she is having over the distal and anterior residual limb.   ? ?Also advised she may proceed with water therapy ? ? ?Follow-Up Instructions: Return if symptoms worsen or fail to improve.  ? ?Ortho Exam ? ?Patient is alert, oriented, no adenopathy, well-dressed, normal affect, normal respiratory effort. ?On examination of the left residual limb she does have a asymmetry centimeter in diameter area of maceration with abrasions from scratching.  She has very mild weeping there is no erythema no warmth I feel this is from movement and insufficient contact in her socket ? ?Imaging: ?No results found. ?No images are attached to the encounter. ? ?Labs: ?Lab Results  ?Component Value Date  ?  HGBA1C 7.7 (H) 07/21/2021  ? HGBA1C 5.4 02/24/2021  ? HGBA1C 6.0 (A) 09/10/2020  ? ESRSEDRATE 130 (H) 03/24/2017  ? ESRSEDRATE 53 (H) 09/14/2015  ? ESRSEDRATE 132 (H) 07/06/2012  ? CRP 13.6 (H) 02/14/2019  ? CRP 38.6 (H) 04/03/2018  ? CRP 5.4 (H) 09/14/2015  ? LABURIC 8.3 (H) 04/15/2019  ? REPTSTATUS 01/02/2021 FINAL 01/01/2021  ? GRAMSTAIN  09/04/2019  ?  FEW WBC PRESENT, PREDOMINANTLY PMN ?MODERATE GRAM POSITIVE COCCI ?  ? CULT MULTIPLE SPECIES PRESENT, SUGGEST RECOLLECTION (A) 01/01/2021  ? [place] STAPHYLOCOCCUS AUREUS 09/04/2019  ? ? ? ?Lab Results  ?Component Value Date  ? ALBUMIN 2.7 (L) 01/12/2021  ? ALBUMIN 2.8 (L) 01/09/2021  ? ALBUMIN 2.8 (L) 01/06/2021  ? ? ?Lab Results  ?Component Value Date  ? MG 2.2 01/12/2021  ? MG 2.0 01/07/2021  ? MG 2.3 04/07/2018  ? ?No results found for: VD25OH ? ?No results found for: PREALBUMIN ? ?  Latest Ref Rng & Units 02/23/2021  ?  9:46 AM 01/12/2021  ?  6:18 AM 01/12/2021  ?  3:56 AM  ?CBC EXTENDED  ?WBC 4.0 - 10.5 K/uL  5.6     ?RBC 3.87 - 5.11 MIL/uL  2.64     ?Hemoglobin 12.0 - 15.0 g/dL 12.2   7.0   6.8    ?HCT  36.0 - 46.0 % 36.0   22.6   21.9    ?Platelets 150 - 400 K/uL  176     ? ? ? ?There is no height or weight on file to calculate BMI. ? ?Orders:  ?No orders of the defined types were placed in this encounter. ? ?No orders of the defined types were placed in this encounter. ? ? ? Procedures: ?Large Joint Inj: R knee on 08/25/2021 8:53 AM ?Indications: pain and diagnostic evaluation ?Details: 22 G 1.5 in needle ? ?Arthrogram: No ? ?Medications: 40 mg methylPREDNISolone acetate 40 MG/ML; 5 mL lidocaine 1 % ?Outcome: tolerated well, no immediate complications ?Procedure, treatment alternatives, risks and benefits explained, specific risks discussed. Consent was given by the patient. Immediately prior to procedure a time out was called to verify the correct patient, procedure, equipment, support staff and site/side marked as required. Patient was prepped and draped in  the usual sterile fashion.  ? ? ? ?Clinical Data: ?No additional findings. ? ?ROS: ? ?All other systems negative, except as noted in the HPI. ?Review of Systems  ?Constitutional:  Negative for chills and fever.  ?Cardiovascular:  Negative for leg swelling.  ?Musculoskeletal:  Positive for arthralgias and gait problem. Negative for joint swelling.  ?Skin:  Positive for color change and wound.  ? ?Objective: ?Vital Signs: There were no vitals taken for this visit. ? ?Specialty Comments:  ?No specialty comments available. ? ?PMFS History: ?Patient Active Problem List  ? Diagnosis Date Noted  ? Weak pulse 07/21/2021  ? Hidradenitis suppurativa 02/24/2021  ? Pyelonephritis 12/31/2020  ? Muscle spasm 12/16/2020  ? Cough 05/26/2020  ? Chronic pain syndrome 01/16/2020  ? Hx of AKA (above knee amputation), left ([place])   ? Acute respiratory failure with hypoxia ([place]) 10/21/2019  ? Blurry vision, right eye 10/08/2019  ? Tobacco use 03/01/2019  ? Phantom limb pain ([place]) 11/08/2018  ? Current moderate episode of major depressive disorder ([place])   ? Hyperkalemia   ? Anemia of chronic disease   ? Fall   ? Status post below knee amputation, left ([place]) 04/11/2017  ? Bipolar affective disorder ([place])   ? Heart failure with preserved ejection fraction ([place]) 02/09/2017  ? Routine adult health maintenance 12/08/2016  ? End-stage renal disease on hemodialysis ([place]) 07/15/2016  ? OSA (obstructive sleep apnea) 05/13/2016  ? Vaginal irritation 03/14/2016  ? Morbid obesity due to excess calories ([place]) 11/27/2015  ? Subclinical hypothyroidism 04/25/2013  ? Controlled type 2 diabetes mellitus ([place]) 07/12/2012  ? Carpal tunnel syndrome, bilateral 01/25/2012  ? Diabetic polyneuropathy associated with type 2 diabetes mellitus ([place]) 07/04/2011  ? Anxiety and depression 06/02/2011  ? GERD 12/06/2007  ? Hyperlipidemia 08/29/2007  ? Hypertension associated with diabetes ([place]) 08/29/2007  ? ?Past Medical History:  ?Diagnosis Date  ? Abdominal muscle pain  09/08/2016  ? Abnormal Pap smear of cervix 2009  ? Abscess   ? history of multiple abscesses  ? Acute bilateral low back pain 02/13/2017  ? Acute blood loss anemia   ? Acute on chronic renal failure ([place]) 07/12/2012  ? Acute renal failure ([place]) 07/12/2012  ? Adjustment disorder with depressed mood 03/26/2017  ? AKI (acute kidney injury) ([place])   ? Anemia 10/21/2019  ? Anemia of chronic disease 2002  ? Anginal pain ([place])   ? Anxiety   ? Panic attacks  ? Bilateral lower extremity edema 05/13/2016  ? Bipolar disorder ([place])   ? Cataract   ? B/L cataract  ? Cellulitis 05/21/2014  ?  right eye  ? CHF (congestive heart failure) ([place])   ? Chronic bronchitis ([place])   ? "get it q yr" (05/13/2013)  ? Chronic pain   ? Chronic pain of right knee 09/08/2016  ? Dehiscence of amputation stump (HCC)   ? Depression   ? Edema of lower extremity   ? Endocarditis 2002  ? subacute bacterial endocarditis.   ? Family history of anesthesia complication   ? "my mom has a hard time coming out from [place]"  ? Fibromyalgia   ? GERD (gastroesophageal reflux disease)   ? occ  ? Heart murmur   ? Herpes simplex type 1 infection 01/16/2018  ? History of blood transfusion   ? "just low blood count" (05/13/2013)  ? Hyperlipidemia   ? Hypertension   ? Hypothyroidism   ? Hypothyroidism, adult 03/21/2014  ? Hypoxia   ? Leukocytosis   ? Necrosis ([place])   ? and ulceration  ? Necrotizing fasciitis s/p OR debridements 07/06/2012  ? Obesity   ? OSA on CPAP   ? does not wear all the time  ? Peripheral neuropathy   ? Pneumonia   ? Severe protein-calorie malnutrition ([place])   ? Type II diabetes mellitus ([place])   ? Type  II  ? Wound dehiscence 09/04/2019  ?  ?Family History  ?Problem Relation Age of Onset  ? Heart failure Mother   ? Diabetes Mother   ? Kidney disease Mother   ? Kidney disease Father   ? Diabetes Father   ? Diabetes Paternal Grandmother   ? Heart failure Paternal Grandmother   ? Other Other   ?  ?Past Surgical History:  ?Procedure Laterality Date  ? A/V  FISTULAGRAM Left 05/12/2021  ? Procedure: A/V FISTULAGRAM;  Surgeon: Broadus John, MD;  Location: [place] [facility];  Service: Cardiovascular;  Laterality: Left;  ? ABDOMINAL AORTOGRAM W/LOWER Sharolyn Douglas

## 2021-08-25 NOTE — Therapy (Signed)
?OUTPATIENT PHYSICAL THERAPY TREATMENT NOTE ? ? ?Patient Name: [name] [name] ?MRN: [id number] ?DOB:06/18/1975, 46 y.o., female ?Today's Date: 08/25/2021 ? ?PCP: Riesa Pope, MD ?REFERRING PROVIDER: Riesa Pope, * ? ? PT End of Session - 08/25/21 1109   ? ? Visit Number 4   ? Number of Visits 17   ? Date for PT Re-Evaluation 10/08/21   ? Authorization Type humana medicare with medicaid secondary. Auth requested   ? Authorization Time Period 17 visits 08/09/21-10/08/21   ? Authorization - Visit Number 4   ? Authorization - Number of Visits 17   ? Progress Note Due on Visit 10   ? PT Start Time 1105   ? PT Stop Time 1145   ? PT Time Calculation (min) 40 min   ? Equipment Utilized During Treatment Gait belt   ? Activity Tolerance Patient tolerated treatment well   ? Behavior During Therapy [facility] for tasks assessed/performed   ? ?  ?  ? ?  ? ? ?Past Medical History:  ?Diagnosis Date  ? Abdominal muscle pain 09/08/2016  ? Abnormal Pap smear of cervix 2009  ? Abscess   ? history of multiple abscesses  ? Acute bilateral low back pain 02/13/2017  ? Acute blood loss anemia   ? Acute on chronic renal failure ([place]) 07/12/2012  ? Acute renal failure ([place]) 07/12/2012  ? Adjustment disorder with depressed mood 03/26/2017  ? AKI (acute kidney injury) ([place])   ? Anemia 10/21/2019  ? Anemia of chronic disease 2002  ? Anginal pain ([place])   ? Anxiety   ? Panic attacks  ? Bilateral lower extremity edema 05/13/2016  ? Bipolar disorder ([place])   ? Cataract   ? B/L cataract  ? Cellulitis 05/21/2014  ? right eye  ? CHF (congestive heart failure) ([place])   ? Chronic bronchitis ([place])   ? "get it q yr" (05/13/2013)  ? Chronic pain   ? Chronic pain of right knee 09/08/2016  ? Dehiscence of amputation stump (HCC)   ? Depression   ? Edema of lower extremity   ? Endocarditis 2002  ? subacute bacterial endocarditis.   ? Family history of anesthesia complication   ? "my mom has a hard time coming out from [place]"  ? Fibromyalgia   ? GERD  (gastroesophageal reflux disease)   ? occ  ? Heart murmur   ? Herpes simplex type 1 infection 01/16/2018  ? History of blood transfusion   ? "just low blood count" (05/13/2013)  ? Hyperlipidemia   ? Hypertension   ? Hypothyroidism   ? Hypothyroidism, adult 03/21/2014  ? Hypoxia   ? Leukocytosis   ? Necrosis ([place])   ? and ulceration  ? Necrotizing fasciitis s/p OR debridements 07/06/2012  ? Obesity   ? OSA on CPAP   ? does not wear all the time  ? Peripheral neuropathy   ? Pneumonia   ? Severe protein-calorie malnutrition ([place])   ? Type II diabetes mellitus ([place])   ? Type  II  ? Wound dehiscence 09/04/2019  ? ?Past Surgical History:  ?Procedure Laterality Date  ? A/V FISTULAGRAM Left 05/12/2021  ? Procedure: A/V FISTULAGRAM;  Surgeon: Broadus John, MD;  Location: [place] [facility];  Service: Cardiovascular;  Laterality: Left;  ? ABDOMINAL AORTOGRAM W/LOWER EXTREMITY Left 03/19/2019  ? Procedure: ABDOMINAL AORTOGRAM W/LOWER EXTREMITY;  Surgeon: Serafina Mitchell, MD;  Location: [place] [facility];  Service: Cardiovascular;  Laterality: Left;  ? ABOVE KNEE LEG AMPUTATION Left 10/11/2019  ?  wound dehisence   ? AIR/FLUID EXCHANGE Right 12/31/2019  ? Procedure: AIR/FLUID EXCHANGE;  Surgeon: Jalene Mullet, MD;  Location: [place];  Service: Ophthalmology;  Laterality: Right;  ? AMPUTATION Left 04/07/2017  ? Procedure: LEFT BELOW KNEE AMPUTATION;  Surgeon: Newt Minion, MD;  Location: [place];  Service: Orthopedics;  Laterality: Left;  ? AMPUTATION Left 10/11/2019  ? Procedure: LEFT ABOVE KNEE AMPUTATION;  Surgeon: Newt Minion, MD;  Location: [place];  Service: Orthopedics;  Laterality: Left;  ? AV FISTULA PLACEMENT Left 01/05/2021  ? Procedure: ARTERIOVENOUS (AV) FISTULA CREATION vs. GRAFT;  Surgeon: Angelia Mould, MD;  Location: [place];  Service: Vascular;  Laterality: Left;  ? CATARACT EXTRACTION W/PHACO Right 02/11/2020  ? Procedure: CATARACT EXTRACTION PHACO AND INTRAOCULAR LENS PLACEMENT (IOC);  Surgeon:  Jalene Mullet, MD;  Location: [place];  Service: Ophthalmology;  Laterality: Right;  ? EYE SURGERY    ? lazer  ? FISTULA SUPERFICIALIZATION Left 02/23/2021  ? Procedure: SUPERFICIALIZATION OF LEFT BRACHIOCEPHALIC FISTULA;  Surgeon: Angelia Mould, MD;  Location: [place];  Service: Vascular;  Laterality: Left;  ? GAS INSERTION Right 12/31/2019  ? Procedure: INSERTION OF GAS;  Surgeon: Jalene Mullet, MD;  Location: [place];  Service: Ophthalmology;  Laterality: Right;  ? INCISION AND DRAINAGE ABSCESS    ? multiple I&Ds  ? INCISION AND DRAINAGE ABSCESS Left 07/09/2012  ? Procedure: DRESSING CHANGE, THIGH WOUND;  Surgeon: Harl Bowie, MD;  Location: [place];  Service: General;  Laterality: Left;  ? INCISION AND DRAINAGE OF WOUND Left 07/07/2012  ? Procedure: IRRIGATION AND DEBRIDEMENT WOUND;  Surgeon: Harl Bowie, MD;  Location: [place];  Service: General;  Laterality: Left;  ? INCISION AND DRAINAGE PERIRECTAL ABSCESS Left 07/14/2012  ? Procedure: DEBRIDEMENT OF SKIN & SOFT TISSUE; DRESSING CHANGE UNDER ANESTHESIA;  Surgeon: Gayland Curry, MD,FACS;  Location: [place];  Service: General;  Laterality: Left;  ? INCISION AND DRAINAGE PERIRECTAL ABSCESS Left 07/16/2012  ? Procedure: I&D Left Thigh;  Surgeon: Gwenyth Ober, MD;  Location: [place];  Service: General;  Laterality: Left;  ? INCISION AND DRAINAGE PERIRECTAL ABSCESS N/A 01/05/2015  ? Procedure: IRRIGATION AND DEBRIDEMENT PERIRECTAL ABSCESS;  Surgeon: Donnie Mesa, MD;  Location: [place];  Service: General;  Laterality: N/A;  ? INSERTION OF DIALYSIS CATHETER Right 01/05/2021  ? Procedure: INSERTION OF DIALYSIS CATHETER;  Surgeon: Angelia Mould, MD;  Location: [facility];  Service: Vascular;  Laterality: Right;  ? IRRIGATION AND DEBRIDEMENT ABSCESS Left 07/06/2012  ? Procedure: IRRIGATION AND DEBRIDEMENT ABSCESS BUTTOCKS AND THIGH;  Surgeon: Shann Medal, MD;  Location: [place];  Service: General;  Laterality: Left;  ? IRRIGATION AND DEBRIDEMENT  ABSCESS Left 08/10/2012  ? Procedure: IRRIGATION AND DEBRIDEMENT ABSCESS;  Surgeon: Madilyn Hook, DO;  Location: [place];  Service: General;  Laterality: Left;  ? PARS PLANA VITRECTOMY Right 12/31/2019  ? Procedure: PARS PLANA VITRECTOMY WITH 25 GAUGE;  Surgeon: Jalene Mullet, MD;  Location: [place];  Service: Ophthalmology;  Laterality: Right;  ? PERIPHERAL VASCULAR INTERVENTION Left 05/12/2021  ? Procedure: PERIPHERAL VASCULAR INTERVENTION;  Surgeon: Broadus John, MD;  Location: [place] [facility];  Service: Cardiovascular;  Laterality: Left;  arm fistula  ? PHOTOCOAGULATION WITH LASER Right 12/31/2019  ? Procedure: PHOTOCOAGULATION WITH LASER;  Surgeon: Jalene Mullet, MD;  Location: [place];  Service: Ophthalmology;  Laterality: Right;  ? REPAIR OF COMPLEX TRACTION RETINAL DETACHMENT Right 12/31/2019  ? Procedure: REPAIR OF COMPLEX TRACTION RETINAL;  Surgeon: Jalene Mullet, MD;  Location: [place];  Service: Ophthalmology;  Laterality: Right;  ? STUMP REVISION Left 06/09/2017  ? Procedure: REVISION LEFT BELOW KNEE AMPUTATION;  Surgeon: Newt Minion, MD;  Location: [place];  Service: Orthopedics;  Laterality: Left;  ? STUMP REVISION Left 09/04/2019  ? Procedure: LEFT BELOW KNEE AMPUTATION REVISION;  Surgeon: Newt Minion, MD;  Location: [place];  Service: Orthopedics;  Laterality: Left;  ? STUMP REVISION Left 09/20/2019  ? Procedure: LEFT BELOW KNEE AMPUTATION REVISION;  Surgeon: Newt Minion, MD;  Location: [place];  Service: Orthopedics;  Laterality: Left;  ? TONSILLECTOMY  1994  ? ?Patient Active Problem List  ? Diagnosis Date Noted  ? Weak pulse 07/21/2021  ? Hidradenitis suppurativa 02/24/2021  ? Pyelonephritis 12/31/2020  ? Muscle spasm 12/16/2020  ? Cough 05/26/2020  ? Chronic pain syndrome 01/16/2020  ? Hx of AKA (above knee amputation), left ([place])   ? Acute respiratory failure with hypoxia ([place]) 10/21/2019  ? Blurry vision, right eye 10/08/2019  ? Tobacco use 03/01/2019  ? Phantom limb pain ([place])  11/08/2018  ? Current moderate episode of major depressive disorder ([place])   ? Hyperkalemia   ? Anemia of chronic disease   ? Fall   ? Status post below knee amputation, left ([place]) 04/11/2017  ? Bipolar affective dis

## 2021-08-26 DIAGNOSIS — N2581 Secondary hyperparathyroidism of renal origin: Secondary | ICD-10-CM | POA: Diagnosis not present

## 2021-08-26 DIAGNOSIS — Z992 Dependence on renal dialysis: Secondary | ICD-10-CM | POA: Diagnosis not present

## 2021-08-26 DIAGNOSIS — N186 End stage renal disease: Secondary | ICD-10-CM | POA: Diagnosis not present

## 2021-08-27 ENCOUNTER — Ambulatory Visit: Payer: Medicare HMO

## 2021-08-27 DIAGNOSIS — R2681 Unsteadiness on feet: Secondary | ICD-10-CM

## 2021-08-27 DIAGNOSIS — M25561 Pain in right knee: Secondary | ICD-10-CM | POA: Diagnosis not present

## 2021-08-27 DIAGNOSIS — G8929 Other chronic pain: Secondary | ICD-10-CM | POA: Diagnosis not present

## 2021-08-27 DIAGNOSIS — R293 Abnormal posture: Secondary | ICD-10-CM | POA: Diagnosis not present

## 2021-08-27 DIAGNOSIS — M6281 Muscle weakness (generalized): Secondary | ICD-10-CM | POA: Diagnosis not present

## 2021-08-27 DIAGNOSIS — R2689 Other abnormalities of gait and mobility: Secondary | ICD-10-CM | POA: Diagnosis not present

## 2021-08-27 NOTE — Therapy (Signed)
?OUTPATIENT PHYSICAL THERAPY TREATMENT NOTE ? ? ?Patient Name: [name] [name] ?MRN: [id number] ?DOB:18-Oct-1975, 46 y.o., female ?Today's Date: 08/27/2021 ? ?PCP: Riesa Pope, MD ?REFERRING PROVIDER: Axel Filler,* ? ? PT End of Session - 08/27/21 [id number]   ? ? Visit Number 5   ? Number of Visits 17   ? Date for PT Re-Evaluation 10/08/21   ? Authorization Type humana medicare with medicaid secondary. Auth requested   ? Authorization Time Period 17 visits 08/09/21-10/08/21   ? Authorization - Visit Number 4   ? Authorization - Number of Visits 17   ? Progress Note Due on Visit 10   ? PT Start Time [phone]   ? PT Stop Time 1017   ? PT Time Calculation (min) 38 min   ? Equipment Utilized During Treatment Gait belt   ? Activity Tolerance Patient tolerated treatment well   ? Behavior During Therapy [facility] for tasks assessed/performed   ? ?  ?  ? ?  ? ? ?Past Medical History:  ?Diagnosis Date  ? Abdominal muscle pain 09/08/2016  ? Abnormal Pap smear of cervix 2009  ? Abscess   ? history of multiple abscesses  ? Acute bilateral low back pain 02/13/2017  ? Acute blood loss anemia   ? Acute on chronic renal failure ([place]) 07/12/2012  ? Acute renal failure ([place]) 07/12/2012  ? Adjustment disorder with depressed mood 03/26/2017  ? AKI (acute kidney injury) ([place])   ? Anemia 10/21/2019  ? Anemia of chronic disease 2002  ? Anginal pain ([place])   ? Anxiety   ? Panic attacks  ? Bilateral lower extremity edema 05/13/2016  ? Bipolar disorder ([place])   ? Cataract   ? B/L cataract  ? Cellulitis 05/21/2014  ? right eye  ? CHF (congestive heart failure) ([place])   ? Chronic bronchitis ([place])   ? "get it q yr" (05/13/2013)  ? Chronic pain   ? Chronic pain of right knee 09/08/2016  ? Dehiscence of amputation stump (HCC)   ? Depression   ? Edema of lower extremity   ? Endocarditis 2002  ? subacute bacterial endocarditis.   ? Family history of anesthesia complication   ? "my mom has a hard time coming out from [place]"  ? Fibromyalgia   ? GERD  (gastroesophageal reflux disease)   ? occ  ? Heart murmur   ? Herpes simplex type 1 infection 01/16/2018  ? History of blood transfusion   ? "just low blood count" (05/13/2013)  ? Hyperlipidemia   ? Hypertension   ? Hypothyroidism   ? Hypothyroidism, adult 03/21/2014  ? Hypoxia   ? Leukocytosis   ? Necrosis ([place])   ? and ulceration  ? Necrotizing fasciitis s/p OR debridements 07/06/2012  ? Obesity   ? OSA on CPAP   ? does not wear all the time  ? Peripheral neuropathy   ? Pneumonia   ? Severe protein-calorie malnutrition ([place])   ? Type II diabetes mellitus ([place])   ? Type  II  ? Wound dehiscence 09/04/2019  ? ?Past Surgical History:  ?Procedure Laterality Date  ? A/V FISTULAGRAM Left 05/12/2021  ? Procedure: A/V FISTULAGRAM;  Surgeon: Broadus John, MD;  Location: [place] [facility];  Service: Cardiovascular;  Laterality: Left;  ? ABDOMINAL AORTOGRAM W/LOWER EXTREMITY Left 03/19/2019  ? Procedure: ABDOMINAL AORTOGRAM W/LOWER EXTREMITY;  Surgeon: Serafina Mitchell, MD;  Location: [place] [facility];  Service: Cardiovascular;  Laterality: Left;  ? ABOVE KNEE LEG AMPUTATION Left 10/11/2019  ?  wound dehisence   ? AIR/FLUID EXCHANGE Right 12/31/2019  ? Procedure: AIR/FLUID EXCHANGE;  Surgeon: Jalene Mullet, MD;  Location: [place];  Service: Ophthalmology;  Laterality: Right;  ? AMPUTATION Left 04/07/2017  ? Procedure: LEFT BELOW KNEE AMPUTATION;  Surgeon: Newt Minion, MD;  Location: [place];  Service: Orthopedics;  Laterality: Left;  ? AMPUTATION Left 10/11/2019  ? Procedure: LEFT ABOVE KNEE AMPUTATION;  Surgeon: Newt Minion, MD;  Location: [place];  Service: Orthopedics;  Laterality: Left;  ? AV FISTULA PLACEMENT Left 01/05/2021  ? Procedure: ARTERIOVENOUS (AV) FISTULA CREATION vs. GRAFT;  Surgeon: Angelia Mould, MD;  Location: [place];  Service: Vascular;  Laterality: Left;  ? CATARACT EXTRACTION W/PHACO Right 02/11/2020  ? Procedure: CATARACT EXTRACTION PHACO AND INTRAOCULAR LENS PLACEMENT (IOC);  Surgeon:  Jalene Mullet, MD;  Location: [place];  Service: Ophthalmology;  Laterality: Right;  ? EYE SURGERY    ? lazer  ? FISTULA SUPERFICIALIZATION Left 02/23/2021  ? Procedure: SUPERFICIALIZATION OF LEFT BRACHIOCEPHALIC FISTULA;  Surgeon: Angelia Mould, MD;  Location: [place];  Service: Vascular;  Laterality: Left;  ? GAS INSERTION Right 12/31/2019  ? Procedure: INSERTION OF GAS;  Surgeon: Jalene Mullet, MD;  Location: [place];  Service: Ophthalmology;  Laterality: Right;  ? INCISION AND DRAINAGE ABSCESS    ? multiple I&Ds  ? INCISION AND DRAINAGE ABSCESS Left 07/09/2012  ? Procedure: DRESSING CHANGE, THIGH WOUND;  Surgeon: Harl Bowie, MD;  Location: [place];  Service: General;  Laterality: Left;  ? INCISION AND DRAINAGE OF WOUND Left 07/07/2012  ? Procedure: IRRIGATION AND DEBRIDEMENT WOUND;  Surgeon: Harl Bowie, MD;  Location: [place];  Service: General;  Laterality: Left;  ? INCISION AND DRAINAGE PERIRECTAL ABSCESS Left 07/14/2012  ? Procedure: DEBRIDEMENT OF SKIN & SOFT TISSUE; DRESSING CHANGE UNDER ANESTHESIA;  Surgeon: Gayland Curry, MD,FACS;  Location: [place];  Service: General;  Laterality: Left;  ? INCISION AND DRAINAGE PERIRECTAL ABSCESS Left 07/16/2012  ? Procedure: I&D Left Thigh;  Surgeon: Gwenyth Ober, MD;  Location: [place];  Service: General;  Laterality: Left;  ? INCISION AND DRAINAGE PERIRECTAL ABSCESS N/A 01/05/2015  ? Procedure: IRRIGATION AND DEBRIDEMENT PERIRECTAL ABSCESS;  Surgeon: Donnie Mesa, MD;  Location: [place];  Service: General;  Laterality: N/A;  ? INSERTION OF DIALYSIS CATHETER Right 01/05/2021  ? Procedure: INSERTION OF DIALYSIS CATHETER;  Surgeon: Angelia Mould, MD;  Location: [facility];  Service: Vascular;  Laterality: Right;  ? IRRIGATION AND DEBRIDEMENT ABSCESS Left 07/06/2012  ? Procedure: IRRIGATION AND DEBRIDEMENT ABSCESS BUTTOCKS AND THIGH;  Surgeon: Shann Medal, MD;  Location: [place];  Service: General;  Laterality: Left;  ? IRRIGATION AND DEBRIDEMENT  ABSCESS Left 08/10/2012  ? Procedure: IRRIGATION AND DEBRIDEMENT ABSCESS;  Surgeon: Madilyn Hook, DO;  Location: [place];  Service: General;  Laterality: Left;  ? PARS PLANA VITRECTOMY Right 12/31/2019  ? Procedure: PARS PLANA VITRECTOMY WITH 25 GAUGE;  Surgeon: Jalene Mullet, MD;  Location: [place];  Service: Ophthalmology;  Laterality: Right;  ? PERIPHERAL VASCULAR INTERVENTION Left 05/12/2021  ? Procedure: PERIPHERAL VASCULAR INTERVENTION;  Surgeon: Broadus John, MD;  Location: [place] [facility];  Service: Cardiovascular;  Laterality: Left;  arm fistula  ? PHOTOCOAGULATION WITH LASER Right 12/31/2019  ? Procedure: PHOTOCOAGULATION WITH LASER;  Surgeon: Jalene Mullet, MD;  Location: [place];  Service: Ophthalmology;  Laterality: Right;  ? REPAIR OF COMPLEX TRACTION RETINAL DETACHMENT Right 12/31/2019  ? Procedure: REPAIR OF COMPLEX TRACTION RETINAL;  Surgeon: Jalene Mullet, MD;  Location: [place];  Service: Ophthalmology;  Laterality: Right;  ? STUMP REVISION Left 06/09/2017  ? Procedure: REVISION LEFT BELOW KNEE AMPUTATION;  Surgeon: Newt Minion, MD;  Location: [place];  Service: Orthopedics;  Laterality: Left;  ? STUMP REVISION Left 09/04/2019  ? Procedure: LEFT BELOW KNEE AMPUTATION REVISION;  Surgeon: Newt Minion, MD;  Location: [place];  Service: Orthopedics;  Laterality: Left;  ? STUMP REVISION Left 09/20/2019  ? Procedure: LEFT BELOW KNEE AMPUTATION REVISION;  Surgeon: Newt Minion, MD;  Location: [place];  Service: Orthopedics;  Laterality: Left;  ? TONSILLECTOMY  1994  ? ?Patient Active Problem List  ? Diagnosis Date Noted  ? Weak pulse 07/21/2021  ? Hidradenitis suppurativa 02/24/2021  ? Pyelonephritis 12/31/2020  ? Muscle spasm 12/16/2020  ? Cough 05/26/2020  ? Chronic pain syndrome 01/16/2020  ? Hx of AKA (above knee amputation), left ([place])   ? Acute respiratory failure with hypoxia ([place]) 10/21/2019  ? Blurry vision, right eye 10/08/2019  ? Tobacco use 03/01/2019  ? Phantom limb pain ([place])  11/08/2018  ? Current moderate episode of major depressive disorder ([place])   ? Hyperkalemia   ? Anemia of chronic disease   ? Fall   ? Status post below knee amputation, left ([place]) 04/11/2017  ? Bipolar affective dis

## 2021-08-28 DIAGNOSIS — N186 End stage renal disease: Secondary | ICD-10-CM | POA: Diagnosis not present

## 2021-08-28 DIAGNOSIS — N2581 Secondary hyperparathyroidism of renal origin: Secondary | ICD-10-CM | POA: Diagnosis not present

## 2021-08-28 DIAGNOSIS — Z992 Dependence on renal dialysis: Secondary | ICD-10-CM | POA: Diagnosis not present

## 2021-08-31 DIAGNOSIS — N2581 Secondary hyperparathyroidism of renal origin: Secondary | ICD-10-CM | POA: Diagnosis not present

## 2021-08-31 DIAGNOSIS — N186 End stage renal disease: Secondary | ICD-10-CM | POA: Diagnosis not present

## 2021-08-31 DIAGNOSIS — Z992 Dependence on renal dialysis: Secondary | ICD-10-CM | POA: Diagnosis not present

## 2021-09-02 DIAGNOSIS — N2581 Secondary hyperparathyroidism of renal origin: Secondary | ICD-10-CM | POA: Diagnosis not present

## 2021-09-02 DIAGNOSIS — Z992 Dependence on renal dialysis: Secondary | ICD-10-CM | POA: Diagnosis not present

## 2021-09-02 DIAGNOSIS — N186 End stage renal disease: Secondary | ICD-10-CM | POA: Diagnosis not present

## 2021-09-04 DIAGNOSIS — N186 End stage renal disease: Secondary | ICD-10-CM | POA: Diagnosis not present

## 2021-09-04 DIAGNOSIS — Z992 Dependence on renal dialysis: Secondary | ICD-10-CM | POA: Diagnosis not present

## 2021-09-04 DIAGNOSIS — N2581 Secondary hyperparathyroidism of renal origin: Secondary | ICD-10-CM | POA: Diagnosis not present

## 2021-09-06 ENCOUNTER — Ambulatory Visit: Payer: Medicare HMO | Admitting: Orthopedic Surgery

## 2021-09-07 DIAGNOSIS — N186 End stage renal disease: Secondary | ICD-10-CM | POA: Diagnosis not present

## 2021-09-07 DIAGNOSIS — Z992 Dependence on renal dialysis: Secondary | ICD-10-CM | POA: Diagnosis not present

## 2021-09-07 DIAGNOSIS — N2581 Secondary hyperparathyroidism of renal origin: Secondary | ICD-10-CM | POA: Diagnosis not present

## 2021-09-08 ENCOUNTER — Ambulatory Visit: Payer: Medicare HMO | Admitting: Physical Therapy

## 2021-09-08 ENCOUNTER — Telehealth: Payer: Self-pay | Admitting: *Deleted

## 2021-09-08 ENCOUNTER — Encounter: Payer: Self-pay | Admitting: Physical Therapy

## 2021-09-08 DIAGNOSIS — M6281 Muscle weakness (generalized): Secondary | ICD-10-CM

## 2021-09-08 DIAGNOSIS — R2681 Unsteadiness on feet: Secondary | ICD-10-CM

## 2021-09-08 DIAGNOSIS — R293 Abnormal posture: Secondary | ICD-10-CM | POA: Diagnosis not present

## 2021-09-08 DIAGNOSIS — R2689 Other abnormalities of gait and mobility: Secondary | ICD-10-CM | POA: Diagnosis not present

## 2021-09-08 DIAGNOSIS — G8929 Other chronic pain: Secondary | ICD-10-CM | POA: Diagnosis not present

## 2021-09-08 DIAGNOSIS — M25561 Pain in right knee: Secondary | ICD-10-CM | POA: Diagnosis not present

## 2021-09-08 NOTE — Telephone Encounter (Signed)
Patient called in stating she thinks she has a UTI. Having urgency, but when she voids "only a trickle comes out." Also, c/o low abdominal cramps, and "slight itch." Denies dysuria, fever, chills. First available tele appt given for 4/21, and in person appt for 4/26. Patient needs this time to set up transportation. ?

## 2021-09-08 NOTE — Therapy (Signed)
?OUTPATIENT PHYSICAL THERAPY TREATMENT NOTE ? ? ?Patient Name: [name] [name] ?MRN: [id number] ?DOB:March 17, 1976, 46 y.o., female ?Today's Date: 09/08/2021 ? ?PCP: Riesa Pope, MD ?REFERRING PROVIDER: Riesa Pope, * ? ? PT End of Session - 09/08/21 1022   ? ? Visit Number 6   ? Number of Visits 17   ? Date for PT Re-Evaluation 10/08/21   ? Authorization Type humana medicare with medicaid secondary. Auth requested   ? Authorization Time Period 17 visits 08/09/21-10/08/21   ? Authorization - Visit Number 6   ? Authorization - Number of Visits 17   ? Progress Note Due on Visit 10   ? PT Start Time 1019   ? PT Stop Time 1100   ? PT Time Calculation (min) 41 min   ? Equipment Utilized During Treatment Gait belt   ? Activity Tolerance Patient tolerated treatment well   ? Behavior During Therapy [facility] for tasks assessed/performed   ? ?  ?  ? ?  ? ? ?Past Medical History:  ?Diagnosis Date  ? Abdominal muscle pain 09/08/2016  ? Abnormal Pap smear of cervix 2009  ? Abscess   ? history of multiple abscesses  ? Acute bilateral low back pain 02/13/2017  ? Acute blood loss anemia   ? Acute on chronic renal failure ([place]) 07/12/2012  ? Acute renal failure ([place]) 07/12/2012  ? Adjustment disorder with depressed mood 03/26/2017  ? AKI (acute kidney injury) ([place])   ? Anemia 10/21/2019  ? Anemia of chronic disease 2002  ? Anginal pain ([place])   ? Anxiety   ? Panic attacks  ? Bilateral lower extremity edema 05/13/2016  ? Bipolar disorder ([place])   ? Cataract   ? B/L cataract  ? Cellulitis 05/21/2014  ? right eye  ? CHF (congestive heart failure) ([place])   ? Chronic bronchitis ([place])   ? "get it q yr" (05/13/2013)  ? Chronic pain   ? Chronic pain of right knee 09/08/2016  ? Dehiscence of amputation stump (HCC)   ? Depression   ? Edema of lower extremity   ? Endocarditis 2002  ? subacute bacterial endocarditis.   ? Family history of anesthesia complication   ? "my mom has a hard time coming out from [place]"  ? Fibromyalgia   ? GERD  (gastroesophageal reflux disease)   ? occ  ? Heart murmur   ? Herpes simplex type 1 infection 01/16/2018  ? History of blood transfusion   ? "just low blood count" (05/13/2013)  ? Hyperlipidemia   ? Hypertension   ? Hypothyroidism   ? Hypothyroidism, adult 03/21/2014  ? Hypoxia   ? Leukocytosis   ? Necrosis ([place])   ? and ulceration  ? Necrotizing fasciitis s/p OR debridements 07/06/2012  ? Obesity   ? OSA on CPAP   ? does not wear all the time  ? Peripheral neuropathy   ? Pneumonia   ? Severe protein-calorie malnutrition ([place])   ? Type II diabetes mellitus ([place])   ? Type  II  ? Wound dehiscence 09/04/2019  ? ?Past Surgical History:  ?Procedure Laterality Date  ? A/V FISTULAGRAM Left 05/12/2021  ? Procedure: A/V FISTULAGRAM;  Surgeon: Broadus John, MD;  Location: [place] [facility];  Service: Cardiovascular;  Laterality: Left;  ? ABDOMINAL AORTOGRAM W/LOWER EXTREMITY Left 03/19/2019  ? Procedure: ABDOMINAL AORTOGRAM W/LOWER EXTREMITY;  Surgeon: Serafina Mitchell, MD;  Location: [place] [facility];  Service: Cardiovascular;  Laterality: Left;  ? ABOVE KNEE LEG AMPUTATION Left 10/11/2019  ?  wound dehisence   ? AIR/FLUID EXCHANGE Right 12/31/2019  ? Procedure: AIR/FLUID EXCHANGE;  Surgeon: Jalene Mullet, MD;  Location: [place];  Service: Ophthalmology;  Laterality: Right;  ? AMPUTATION Left 04/07/2017  ? Procedure: LEFT BELOW KNEE AMPUTATION;  Surgeon: Newt Minion, MD;  Location: [place];  Service: Orthopedics;  Laterality: Left;  ? AMPUTATION Left 10/11/2019  ? Procedure: LEFT ABOVE KNEE AMPUTATION;  Surgeon: Newt Minion, MD;  Location: [place];  Service: Orthopedics;  Laterality: Left;  ? AV FISTULA PLACEMENT Left 01/05/2021  ? Procedure: ARTERIOVENOUS (AV) FISTULA CREATION vs. GRAFT;  Surgeon: Angelia Mould, MD;  Location: [place];  Service: Vascular;  Laterality: Left;  ? CATARACT EXTRACTION W/PHACO Right 02/11/2020  ? Procedure: CATARACT EXTRACTION PHACO AND INTRAOCULAR LENS PLACEMENT (IOC);  Surgeon:  Jalene Mullet, MD;  Location: [place];  Service: Ophthalmology;  Laterality: Right;  ? EYE SURGERY    ? lazer  ? FISTULA SUPERFICIALIZATION Left 02/23/2021  ? Procedure: SUPERFICIALIZATION OF LEFT BRACHIOCEPHALIC FISTULA;  Surgeon: Angelia Mould, MD;  Location: [place];  Service: Vascular;  Laterality: Left;  ? GAS INSERTION Right 12/31/2019  ? Procedure: INSERTION OF GAS;  Surgeon: Jalene Mullet, MD;  Location: [place];  Service: Ophthalmology;  Laterality: Right;  ? INCISION AND DRAINAGE ABSCESS    ? multiple I&Ds  ? INCISION AND DRAINAGE ABSCESS Left 07/09/2012  ? Procedure: DRESSING CHANGE, THIGH WOUND;  Surgeon: Harl Bowie, MD;  Location: [place];  Service: General;  Laterality: Left;  ? INCISION AND DRAINAGE OF WOUND Left 07/07/2012  ? Procedure: IRRIGATION AND DEBRIDEMENT WOUND;  Surgeon: Harl Bowie, MD;  Location: [place];  Service: General;  Laterality: Left;  ? INCISION AND DRAINAGE PERIRECTAL ABSCESS Left 07/14/2012  ? Procedure: DEBRIDEMENT OF SKIN & SOFT TISSUE; DRESSING CHANGE UNDER ANESTHESIA;  Surgeon: Gayland Curry, MD,FACS;  Location: [place];  Service: General;  Laterality: Left;  ? INCISION AND DRAINAGE PERIRECTAL ABSCESS Left 07/16/2012  ? Procedure: I&D Left Thigh;  Surgeon: Gwenyth Ober, MD;  Location: [place];  Service: General;  Laterality: Left;  ? INCISION AND DRAINAGE PERIRECTAL ABSCESS N/A 01/05/2015  ? Procedure: IRRIGATION AND DEBRIDEMENT PERIRECTAL ABSCESS;  Surgeon: Donnie Mesa, MD;  Location: [place];  Service: General;  Laterality: N/A;  ? INSERTION OF DIALYSIS CATHETER Right 01/05/2021  ? Procedure: INSERTION OF DIALYSIS CATHETER;  Surgeon: Angelia Mould, MD;  Location: [facility] OR;  Service: Vascular;  Laterality: Right;  ? IRRIGATION AND DEBRIDEMENT ABSCESS Left 07/06/2012  ? Procedure: IRRIGATION AND DEBRIDEMENT ABSCESS BUTTOCKS AND THIGH;  Surgeon: Shann Medal, MD;  Location: [place];  Service: General;  Laterality: Left;  ? IRRIGATION AND DEBRIDEMENT  ABSCESS Left 08/10/2012  ? Procedure: IRRIGATION AND DEBRIDEMENT ABSCESS;  Surgeon: Madilyn Hook, DO;  Location: [place];  Service: General;  Laterality: Left;  ? PARS PLANA VITRECTOMY Right 12/31/2019  ? Procedure: PARS PLANA VITRECTOMY WITH 25 GAUGE;  Surgeon: Jalene Mullet, MD;  Location: [place];  Service: Ophthalmology;  Laterality: Right;  ? PERIPHERAL VASCULAR INTERVENTION Left 05/12/2021  ? Procedure: PERIPHERAL VASCULAR INTERVENTION;  Surgeon: Broadus John, MD;  Location: [place] [facility];  Service: Cardiovascular;  Laterality: Left;  arm fistula  ? PHOTOCOAGULATION WITH LASER Right 12/31/2019  ? Procedure: PHOTOCOAGULATION WITH LASER;  Surgeon: Jalene Mullet, MD;  Location: [place];  Service: Ophthalmology;  Laterality: Right;  ? REPAIR OF COMPLEX TRACTION RETINAL DETACHMENT Right 12/31/2019  ? Procedure: REPAIR OF COMPLEX TRACTION RETINAL;  Surgeon: Jalene Mullet, MD;  Location: [place];  Service: Ophthalmology;  Laterality: Right;  ? STUMP REVISION Left 06/09/2017  ? Procedure: REVISION LEFT BELOW KNEE AMPUTATION;  Surgeon: Newt Minion, MD;  Location: [place];  Service: Orthopedics;  Laterality: Left;  ? STUMP REVISION Left 09/04/2019  ? Procedure: LEFT BELOW KNEE AMPUTATION REVISION;  Surgeon: Newt Minion, MD;  Location: [place];  Service: Orthopedics;  Laterality: Left;  ? STUMP REVISION Left 09/20/2019  ? Procedure: LEFT BELOW KNEE AMPUTATION REVISION;  Surgeon: Newt Minion, MD;  Location: [place];  Service: Orthopedics;  Laterality: Left;  ? TONSILLECTOMY  1994  ? ?Patient Active Problem List  ? Diagnosis Date Noted  ? Weak pulse 07/21/2021  ? Hidradenitis suppurativa 02/24/2021  ? Pyelonephritis 12/31/2020  ? Muscle spasm 12/16/2020  ? Cough 05/26/2020  ? Chronic pain syndrome 01/16/2020  ? Hx of AKA (above knee amputation), left ([place])   ? Acute respiratory failure with hypoxia ([place]) 10/21/2019  ? Blurry vision, right eye 10/08/2019  ? Tobacco use 03/01/2019  ? Phantom limb pain ([place])  11/08/2018  ? Current moderate episode of major depressive disorder ([place])   ? Hyperkalemia   ? Anemia of chronic disease   ? Fall   ? Status post below knee amputation, left ([place]) 04/11/2017  ? Bipolar affective di

## 2021-09-09 ENCOUNTER — Institutional Professional Consult (permissible substitution): Payer: Medicare HMO | Admitting: Behavioral Health

## 2021-09-09 DIAGNOSIS — N2581 Secondary hyperparathyroidism of renal origin: Secondary | ICD-10-CM | POA: Diagnosis not present

## 2021-09-09 DIAGNOSIS — N186 End stage renal disease: Secondary | ICD-10-CM | POA: Diagnosis not present

## 2021-09-09 DIAGNOSIS — Z992 Dependence on renal dialysis: Secondary | ICD-10-CM | POA: Diagnosis not present

## 2021-09-10 ENCOUNTER — Ambulatory Visit (INDEPENDENT_AMBULATORY_CARE_PROVIDER_SITE_OTHER): Payer: Medicare HMO | Admitting: Internal Medicine

## 2021-09-10 ENCOUNTER — Ambulatory Visit: Payer: Medicare HMO

## 2021-09-10 DIAGNOSIS — R293 Abnormal posture: Secondary | ICD-10-CM | POA: Diagnosis not present

## 2021-09-10 DIAGNOSIS — M25561 Pain in right knee: Secondary | ICD-10-CM | POA: Diagnosis not present

## 2021-09-10 DIAGNOSIS — R2681 Unsteadiness on feet: Secondary | ICD-10-CM

## 2021-09-10 DIAGNOSIS — G8929 Other chronic pain: Secondary | ICD-10-CM | POA: Diagnosis not present

## 2021-09-10 DIAGNOSIS — M6281 Muscle weakness (generalized): Secondary | ICD-10-CM | POA: Diagnosis not present

## 2021-09-10 DIAGNOSIS — R3915 Urgency of urination: Secondary | ICD-10-CM | POA: Diagnosis not present

## 2021-09-10 DIAGNOSIS — R2689 Other abnormalities of gait and mobility: Secondary | ICD-10-CM

## 2021-09-10 NOTE — Therapy (Signed)
?OUTPATIENT PHYSICAL THERAPY TREATMENT NOTE ? ? ?Patient Name: [name] [name] ?MRN: [id number] ?DOB:Dec 30, 1975, 46 y.o., female ?Today's Date: 09/10/2021 ? ?PCP: Riesa Pope, MD ?REFERRING PROVIDER: Axel Filler,* ? ? PT End of Session - 09/10/21 [phone]   ? ? Visit Number 7   ? Number of Visits 17   ? Date for PT Re-Evaluation 10/08/21   ? Authorization Type humana medicare with medicaid secondary. Auth requested   ? Authorization Time Period 17 visits 08/09/21-10/08/21   ? Authorization - Visit Number 7   ? Authorization - Number of Visits 17   ? Progress Note Due on Visit 10   ? PT Start Time [phone]   ? PT Stop Time 1018   ? PT Time Calculation (min) 45 min   ? Equipment Utilized During Treatment Gait belt   ? Activity Tolerance Patient tolerated treatment well   ? Behavior During Therapy [facility] for tasks assessed/performed   ? ?  ?  ? ?  ? ? ?Past Medical History:  ?Diagnosis Date  ? Abdominal muscle pain 09/08/2016  ? Abnormal Pap smear of cervix 2009  ? Abscess   ? history of multiple abscesses  ? Acute bilateral low back pain 02/13/2017  ? Acute blood loss anemia   ? Acute on chronic renal failure ([place]) 07/12/2012  ? Acute renal failure ([place]) 07/12/2012  ? Adjustment disorder with depressed mood 03/26/2017  ? AKI (acute kidney injury) ([place])   ? Anemia 10/21/2019  ? Anemia of chronic disease 2002  ? Anginal pain ([place])   ? Anxiety   ? Panic attacks  ? Bilateral lower extremity edema 05/13/2016  ? Bipolar disorder ([place])   ? Cataract   ? B/L cataract  ? Cellulitis 05/21/2014  ? right eye  ? CHF (congestive heart failure) ([place])   ? Chronic bronchitis ([place])   ? "get it q yr" (05/13/2013)  ? Chronic pain   ? Chronic pain of right knee 09/08/2016  ? Dehiscence of amputation stump (HCC)   ? Depression   ? Edema of lower extremity   ? Endocarditis 2002  ? subacute bacterial endocarditis.   ? Family history of anesthesia complication   ? "my mom has a hard time coming out from [place]"  ? Fibromyalgia   ? GERD  (gastroesophageal reflux disease)   ? occ  ? Heart murmur   ? Herpes simplex type 1 infection 01/16/2018  ? History of blood transfusion   ? "just low blood count" (05/13/2013)  ? Hyperlipidemia   ? Hypertension   ? Hypothyroidism   ? Hypothyroidism, adult 03/21/2014  ? Hypoxia   ? Leukocytosis   ? Necrosis ([place])   ? and ulceration  ? Necrotizing fasciitis s/p OR debridements 07/06/2012  ? Obesity   ? OSA on CPAP   ? does not wear all the time  ? Peripheral neuropathy   ? Pneumonia   ? Severe protein-calorie malnutrition ([place])   ? Type II diabetes mellitus ([place])   ? Type  II  ? Wound dehiscence 09/04/2019  ? ?Past Surgical History:  ?Procedure Laterality Date  ? A/V FISTULAGRAM Left 05/12/2021  ? Procedure: A/V FISTULAGRAM;  Surgeon: Broadus John, MD;  Location: [place] [facility];  Service: Cardiovascular;  Laterality: Left;  ? ABDOMINAL AORTOGRAM W/LOWER EXTREMITY Left 03/19/2019  ? Procedure: ABDOMINAL AORTOGRAM W/LOWER EXTREMITY;  Surgeon: Serafina Mitchell, MD;  Location: [place] [facility];  Service: Cardiovascular;  Laterality: Left;  ? ABOVE KNEE LEG AMPUTATION Left 10/11/2019  ?  wound dehisence   ? AIR/FLUID EXCHANGE Right 12/31/2019  ? Procedure: AIR/FLUID EXCHANGE;  Surgeon: Jalene Mullet, MD;  Location: [place];  Service: Ophthalmology;  Laterality: Right;  ? AMPUTATION Left 04/07/2017  ? Procedure: LEFT BELOW KNEE AMPUTATION;  Surgeon: Newt Minion, MD;  Location: [place];  Service: Orthopedics;  Laterality: Left;  ? AMPUTATION Left 10/11/2019  ? Procedure: LEFT ABOVE KNEE AMPUTATION;  Surgeon: Newt Minion, MD;  Location: [place];  Service: Orthopedics;  Laterality: Left;  ? AV FISTULA PLACEMENT Left 01/05/2021  ? Procedure: ARTERIOVENOUS (AV) FISTULA CREATION vs. GRAFT;  Surgeon: Angelia Mould, MD;  Location: [place];  Service: Vascular;  Laterality: Left;  ? CATARACT EXTRACTION W/PHACO Right 02/11/2020  ? Procedure: CATARACT EXTRACTION PHACO AND INTRAOCULAR LENS PLACEMENT (IOC);  Surgeon:  Jalene Mullet, MD;  Location: [place];  Service: Ophthalmology;  Laterality: Right;  ? EYE SURGERY    ? lazer  ? FISTULA SUPERFICIALIZATION Left 02/23/2021  ? Procedure: SUPERFICIALIZATION OF LEFT BRACHIOCEPHALIC FISTULA;  Surgeon: Angelia Mould, MD;  Location: [place];  Service: Vascular;  Laterality: Left;  ? GAS INSERTION Right 12/31/2019  ? Procedure: INSERTION OF GAS;  Surgeon: Jalene Mullet, MD;  Location: [place];  Service: Ophthalmology;  Laterality: Right;  ? INCISION AND DRAINAGE ABSCESS    ? multiple I&Ds  ? INCISION AND DRAINAGE ABSCESS Left 07/09/2012  ? Procedure: DRESSING CHANGE, THIGH WOUND;  Surgeon: Harl Bowie, MD;  Location:  [place];  Service: General;  Laterality: Left;  ? INCISION AND DRAINAGE OF WOUND Left 07/07/2012  ? Procedure: IRRIGATION AND DEBRIDEMENT WOUND;  Surgeon: Harl Bowie, MD;  Location: [place];  Service: General;  Laterality: Left;  ? INCISION AND DRAINAGE PERIRECTAL ABSCESS Left 07/14/2012  ? Procedure: DEBRIDEMENT OF SKIN & SOFT TISSUE; DRESSING CHANGE UNDER ANESTHESIA;  Surgeon: Gayland Curry, MD,FACS;  Location: [place];  Service: General;  Laterality: Left;  ? INCISION AND DRAINAGE PERIRECTAL ABSCESS Left 07/16/2012  ? Procedure: I&D Left Thigh;  Surgeon: Gwenyth Ober, MD;  Location: [place];  Service: General;  Laterality: Left;  ? INCISION AND DRAINAGE PERIRECTAL ABSCESS N/A 01/05/2015  ? Procedure: IRRIGATION AND DEBRIDEMENT PERIRECTAL ABSCESS;  Surgeon: Donnie Mesa, MD;  Location: [place];  Service: General;  Laterality: N/A;  ? INSERTION OF DIALYSIS CATHETER Right 01/05/2021  ? Procedure: INSERTION OF DIALYSIS CATHETER;  Surgeon: Angelia Mould, MD;  Location: [facility];  Service: Vascular;  Laterality: Right;  ? IRRIGATION AND DEBRIDEMENT ABSCESS Left 07/06/2012  ? Procedure: IRRIGATION AND DEBRIDEMENT ABSCESS BUTTOCKS AND THIGH;  Surgeon: Shann Medal, MD;  Location: [place];  Service: General;  Laterality: Left;  ? IRRIGATION AND DEBRIDEMENT  ABSCESS Left 08/10/2012  ? Procedure: IRRIGATION AND DEBRIDEMENT ABSCESS;  Surgeon: Madilyn Hook, DO;  Location: [place];  Service: General;  Laterality: Left;  ? PARS PLANA VITRECTOMY Right 12/31/2019  ? Procedure: PARS PLANA VITRECTOMY WITH 25 GAUGE;  Surgeon: Jalene Mullet, MD;  Location: [place];  Service: Ophthalmology;  Laterality: Right;  ? PERIPHERAL VASCULAR INTERVENTION Left 05/12/2021  ? Procedure: PERIPHERAL VASCULAR INTERVENTION;  Surgeon: Broadus John, MD;  Location: [place] [facility];  Service: Cardiovascular;  Laterality: Left;  arm fistula  ? PHOTOCOAGULATION WITH LASER Right 12/31/2019  ? Procedure: PHOTOCOAGULATION WITH LASER;  Surgeon: Jalene Mullet, MD;  Location: [place];  Service: Ophthalmology;  Laterality: Right;  ? REPAIR OF COMPLEX TRACTION RETINAL DETACHMENT Right 12/31/2019  ? Procedure: REPAIR OF COMPLEX TRACTION RETINAL;  Surgeon: Jalene Mullet, MD;  Location: [place];  Service: Ophthalmology;  Laterality: Right;  ? STUMP REVISION Left 06/09/2017  ? Procedure: REVISION LEFT BELOW KNEE AMPUTATION;  Surgeon: Newt Minion, MD;  Location: [place];  Service: Orthopedics;  Laterality: Left;  ? STUMP REVISION Left 09/04/2019  ? Procedure: LEFT BELOW KNEE AMPUTATION REVISION;  Surgeon: Newt Minion, MD;  Location: [place];  Service: Orthopedics;  Laterality: Left;  ? STUMP REVISION Left 09/20/2019  ? Procedure: LEFT BELOW KNEE AMPUTATION REVISION;  Surgeon: Newt Minion, MD;  Location: [place];  Service: Orthopedics;  Laterality: Left;  ? TONSILLECTOMY  1994  ? ?Patient Active Problem List  ? Diagnosis Date Noted  ? Weak pulse 07/21/2021  ? Hidradenitis suppurativa 02/24/2021  ? Pyelonephritis 12/31/2020  ? Muscle spasm 12/16/2020  ? Cough 05/26/2020  ? Chronic pain syndrome 01/16/2020  ? Hx of AKA (above knee amputation), left ([place])   ? Acute respiratory failure with hypoxia ([place]) 10/21/2019  ? Blurry vision, right eye 10/08/2019  ? Tobacco use 03/01/2019  ? Phantom limb pain ([place])  11/08/2018  ? Current moderate episode of major depressive disorder ([place])   ? Hyperkalemia   ? Anemia of chronic disease   ? Fall   ? Status post below knee amputation, left ([place]) 04/11/2017  ? Bipolar affective di

## 2021-09-10 NOTE — Progress Notes (Signed)
?[facility] Internal Medicine Residency Telephone Encounter ?Continuity Care Appointment ? ?HPI:  ?This telephone encounter was created for [name] on 09/10/2021 for the following purpose/cc urgency and vulva irritation.  ESRD patient who makes urine and has had recent urgency after normal voiding.  She is sexually active and does not use condoms.. Has noticed a change in smell of vaginal discharge. No fever or chills. No treatment for similar symptoms in last 6 months. She is also concerned for possible yeast on her left stump. She is being see by orthopedic surgery and fitted for prosthesis. She has been told this is a callus and not yeast. Discussed limitation of telephone encounter. Patient scheduled next week with for in person visit and my recommendation is in person evaluation.. Recommended calling resident on call or seek emergent attention if worsening.   ? ?Past Medical History:  ?Past Medical History:  ?Diagnosis Date  ? Abdominal muscle pain 09/08/2016  ? Abnormal Pap smear of cervix 2009  ? Abscess   ? history of multiple abscesses  ? Acute bilateral low back pain 02/13/2017  ? Acute blood loss anemia   ? Acute on chronic renal failure ([place]) 07/12/2012  ? Acute renal failure ([place]) 07/12/2012  ? Adjustment disorder with depressed mood 03/26/2017  ? AKI (acute kidney injury) ([place])   ? Anemia 10/21/2019  ? Anemia of chronic disease 2002  ? Anginal pain ([place])   ? Anxiety   ? Panic attacks  ? Bilateral lower extremity edema 05/13/2016  ? Bipolar disorder ([place])   ? Cataract   ? B/L cataract  ? Cellulitis 05/21/2014  ? right eye  ? CHF (congestive heart failure) ([place])   ? Chronic bronchitis ([place])   ? "get it q yr" (05/13/2013)  ? Chronic pain   ? Chronic pain of right knee 09/08/2016  ? Dehiscence of amputation stump (HCC)   ? Depression   ? Edema of lower extremity   ? Endocarditis 2002  ? subacute bacterial endocarditis.   ? Family history of anesthesia complication   ? "my mom has a hard time  coming out from [place]"  ? Fibromyalgia   ? GERD (gastroesophageal reflux disease)   ? occ  ? Heart murmur   ? Herpes simplex type 1 infection 01/16/2018  ? History of blood transfusion   ? "just low blood count" (05/13/2013)  ? Hyperlipidemia   ? Hypertension   ? Hypothyroidism   ? Hypothyroidism, adult 03/21/2014  ? Hypoxia   ? Leukocytosis   ? Necrosis ([place])   ? and ulceration  ? Necrotizing fasciitis s/p OR debridements 07/06/2012  ? Obesity   ? OSA on CPAP   ? does not wear all the time  ? Peripheral neuropathy   ? Pneumonia   ? Severe protein-calorie malnutrition ([place])   ? Type II diabetes mellitus ([place])   ? Type  II  ? Wound dehiscence 09/04/2019  ?  ? ?ROS:  ?Review of Systems  ?Constitutional:  Negative for chills and fever.  ?Genitourinary:  Positive for frequency and urgency. Negative for dysuria and hematuria.  ?  ? ?Assessment / Plan / Recommendations:  ?Please see A&P under problem oriented charting for assessment of the patient's acute and chronic medical conditions.  ?As always, pt is advised that if symptoms worsen or new symptoms arise, they should go to an urgent care facility or to to ER for further evaluation.  ? ?Consent and Medical Decision Making:  ?Patient discussed with Dr. Angelia Mould ?  This is a telephone encounter between [facility] and [name] [name] on 09/10/2021 for urgency and vulvar irritation. The visit was conducted with the patient located at home and [name] [name] at [facility]. The patient's identity was confirmed using their DOB and current address. The patient has consented to being evaluated through a telephone encounter and understands the associated risks (an examination cannot be done and the patient may need to come in for an appointment) / benefits (allows the patient to remain at home, decreasing exposure to coronavirus). I personally spent 15 minutes on medical discussion.   ?  ?

## 2021-09-11 DIAGNOSIS — Z992 Dependence on renal dialysis: Secondary | ICD-10-CM | POA: Diagnosis not present

## 2021-09-11 DIAGNOSIS — N186 End stage renal disease: Secondary | ICD-10-CM | POA: Diagnosis not present

## 2021-09-11 DIAGNOSIS — N2581 Secondary hyperparathyroidism of renal origin: Secondary | ICD-10-CM | POA: Diagnosis not present

## 2021-09-12 ENCOUNTER — Encounter: Payer: Self-pay | Admitting: Internal Medicine

## 2021-09-12 DIAGNOSIS — R3915 Urgency of urination: Secondary | ICD-10-CM | POA: Insufficient documentation

## 2021-09-12 NOTE — Assessment & Plan Note (Signed)
Patient is experiencing urgency and vulvar irritation. This is telehealth visit and patient needs further evaluation in person. She is scheduled for next week. No further workup possible at this time. Will hold off on starting empiric treatments as I am not clear if patient has UTI and/or STI at this time.  ?

## 2021-09-14 DIAGNOSIS — N186 End stage renal disease: Secondary | ICD-10-CM | POA: Diagnosis not present

## 2021-09-14 DIAGNOSIS — Z992 Dependence on renal dialysis: Secondary | ICD-10-CM | POA: Diagnosis not present

## 2021-09-14 DIAGNOSIS — N2581 Secondary hyperparathyroidism of renal origin: Secondary | ICD-10-CM | POA: Diagnosis not present

## 2021-09-14 NOTE — Progress Notes (Signed)
Internal Medicine Clinic Attending  Case discussed with Dr. Steen  At the time of the visit.  We reviewed the resident's history and exam and pertinent patient test results.  I agree with the assessment, diagnosis, and plan of care documented in the resident's note.  

## 2021-09-15 ENCOUNTER — Encounter: Payer: Self-pay | Admitting: Internal Medicine

## 2021-09-15 ENCOUNTER — Ambulatory Visit: Payer: Medicare HMO | Admitting: Physical Therapy

## 2021-09-15 ENCOUNTER — Ambulatory Visit (INDEPENDENT_AMBULATORY_CARE_PROVIDER_SITE_OTHER): Payer: Medicare HMO | Admitting: Internal Medicine

## 2021-09-15 ENCOUNTER — Other Ambulatory Visit (HOSPITAL_COMMUNITY)
Admission: RE | Admit: 2021-09-15 | Discharge: 2021-09-15 | Disposition: A | Discharge status: Home / Self Care | Payer: Medicare HMO | Source: Ambulatory Visit | Attending: Internal Medicine | Admitting: Internal Medicine

## 2021-09-15 VITALS — BP 147/97 | HR 90 | Temp 98.1°F

## 2021-09-15 DIAGNOSIS — N949 Unspecified condition associated with female genital organs and menstrual cycle: Secondary | ICD-10-CM | POA: Insufficient documentation

## 2021-09-15 DIAGNOSIS — Z89612 Acquired absence of left leg above knee: Secondary | ICD-10-CM

## 2021-09-15 DIAGNOSIS — R3915 Urgency of urination: Secondary | ICD-10-CM

## 2021-09-15 DIAGNOSIS — Z0000 Encounter for general adult medical examination without abnormal findings: Secondary | ICD-10-CM | POA: Diagnosis not present

## 2021-09-15 LAB — URINALYSIS, ROUTINE W REFLEX MICROSCOPIC
Bilirubin Urine: NEGATIVE
Glucose, UA: >=500 mg/dL — AB
Nitrite: NEGATIVE
Protein, ur: >300 mg/dL — AB
Specific Gravity, Urine: >=1.03 (ref 1.005–1.030)
pH: 6 (ref 5.0–8.0)

## 2021-09-15 LAB — URINALYSIS, MICROSCOPIC (REFLEX)

## 2021-09-15 MED ORDER — KETOCONAZOLE 2 % EX CREA: 1.0000 "application " | TOPICAL_CREAM | Freq: Every day | CUTANEOUS | 0 refills | Status: DC

## 2021-09-15 NOTE — Progress Notes (Signed)
? ?CC: Vaginal irritation ? ?HPI: ? ?[name] [name] is a 46 y.o. female who presents today for the complaint of vaginal irritation.  Please see problem charting for detail assessment and plan. ? ?Past Medical History:  ?Diagnosis Date  ? Abdominal muscle pain 09/08/2016  ? Abnormal Pap smear of cervix 2009  ? Abscess   ? history of multiple abscesses  ? Acute bilateral low back pain 02/13/2017  ? Acute blood loss anemia   ? Acute on chronic renal failure ([place]) 07/12/2012  ? Acute renal failure ([place]) 07/12/2012  ? Acute respiratory failure with hypoxia ([place]) 10/21/2019  ? Adjustment disorder with depressed mood 03/26/2017  ? AKI (acute kidney injury) ([place])   ? Anemia 10/21/2019  ? Anemia of chronic disease 2002  ? Anginal pain ([place])   ? Anxiety   ? Panic attacks  ? Bilateral lower extremity edema 05/13/2016  ? Bipolar disorder ([place])   ? Cataract   ? B/L cataract  ? Cellulitis 05/21/2014  ? right eye  ? CHF (congestive heart failure) ([place])   ? Chronic bronchitis ([place])   ? "get it q yr" (05/13/2013)  ? Chronic pain   ? Chronic pain of right knee 09/08/2016  ? Dehiscence of amputation stump (HCC)   ? Depression   ? Edema of lower extremity   ? Endocarditis 2002  ? subacute bacterial endocarditis.   ? Fall   ? Family history of anesthesia complication   ? "my mom has a hard time coming out from under"  ? Fibromyalgia   ? GERD (gastroesophageal reflux disease)   ? occ  ? Heart murmur   ? Herpes simplex type 1 infection 01/16/2018  ? History of blood transfusion   ? "just low blood count" (05/13/2013)  ? Hyperlipidemia   ? Hypertension   ? Hypothyroidism   ? Hypothyroidism, adult 03/21/2014  ? Hypoxia   ? Leukocytosis   ? Muscle spasm 12/16/2020  ? Necrosis ([place])   ? and ulceration  ? Necrotizing fasciitis s/p OR debridements 07/06/2012  ? Obesity   ? OSA on CPAP   ? does not wear all the time  ? Peripheral neuropathy   ? Pneumonia   ? Pyelonephritis 12/31/2020  ? Routine adult health maintenance 12/08/2016  ? Severe  protein-calorie malnutrition ([place])   ? Status post below knee amputation, left ([place]) 04/11/2017  ? Type II diabetes mellitus ([place])   ? Type  II  ? Vaginal irritation 03/14/2016  ? Weak pulse 07/21/2021  ? Wound dehiscence 09/04/2019  ? ?Review of Systems:   ?Review of Systems  ?Constitutional:  Negative for chills, fever and weight loss.  ?Gastrointestinal:  Negative for abdominal pain.  ?Genitourinary:  Positive for urgency. Negative for dysuria, flank pain and frequency.  ?     Negative for vaginal discharge. ?Positive for vaginal odor.  ?Skin:  Positive for itching.  ? ? ?Physical Exam: ? ?Vitals:  ? 09/15/21 1453  ?BP: (!) 147/97  ?Pulse: 90  ?Temp: 98.1 ?F (36.7 ?C)  ?TempSrc: Oral  ?SpO2: 100%  ?Weight: (P) 268 lb (121.6 kg)  ? ?Constitutional:Chronically ill woman in no acute distress. ?Cardio:Regular rate and rhythm. ?Pulm:Clear to auscultation bilaterally. ?Abdomen:Soft, nontender, nondistended. ?GU: No CVA tenderness. ?MSK:S/p L AKA. AKA stump has dry ashen skin with some excoriations superolaterally. No increased warmth, erythema, edema. ?Skin:Warm and dry with exception of above findings. ?Neuro:Alert and oriented x3. No focal deficit noted. ?Psych:Normal mood and affect. ? ? ?Assessment & Plan:  ? ?Urgency  of urination ?Patient explains that she has had a tingling, sort of itch, sensation near her urethra for some time now and she is unable to quantify the length of time she has been experiencing this.  She also reports persistent odor despite showers but denies any discharge.  She is also had increased urgency with minimal urine output described by pressure sensation at times when she sits down to attempt using the restroom.  She has also had some abdominal cramps but does say that she is prone to cramping.  She denies fevers, chills, GI upset.  She has inspected her genitalia with assistance of a mirror and has not noticed any abnormal bumps, irritation, discharge, redness, swelling.  She has not had  new sexual partners.  There is no CVA or abdominal tenderness. ? ?She was last treated for UTI in August 2022 with ceftriaxone for 7 days. ?Assessment: Patient has difficulty providing urine sample despite previously producing a good amount of urine.  She is ESRD on HD.  Her symptoms could fit both a acute cystitis and STD symptom profile. ?Plan: Patient performed self swab and I will call her with these results and further management as indicated.  We were able to collect a very small amount of urine and we will attempt to use this for testing for acute cystitis. ? ?Hx of AKA (above knee amputation), left ([place]) ?Patient states that she has had severe itching over her left AKA stump to the point of almost breaking the skin from aggressive scratching.  She has spoke with her orthopedist and was told that it was likely related to the fit for prosthetic and she did get a new prosthetic today.  She has been applying coconut oil over the area but this only helps the dry appearance of the skin.  She does apply antibiotic ointment over the areas where she has created breaks in the skin from scratching. ? ?The stump is without increased warmth or erythema but the skin is ashen and dry appearing with few excoriations. ?Assessment: I am concerned for tinea corporis affecting her stump. ?Plan: I have sent in ketoconazole cream for daily application. ? ?Routine adult health maintenance ?Patient brought fit test in today. ? ?See Encounters Tab for problem based charting. ? ?Patient discussed with Dr.  Saverio Danker ? ? ?

## 2021-09-15 NOTE — Patient Instructions (Signed)
It was nice seeing you today! Thank you for choosing [facility] for your Primary Care.  ?  ?Today we talked about:  ? ?Urinary discomfort: I am sorry you are having these symptoms! I have ordered tests from the swab you gave [place] for STIs and will call you with these results.  ? ?2. Left stump itching: I have sent in an antifungal cream that you can apply once daily.  ? ?Please follow up in about 1 month. ? ?Remember: If you have any questions or concerns, please call our clinic at [phone] between 9am-5pm and after hours call [phone] and ask for the internal medicine resident on call. If you feel you are having a medical emergency please call 911. ? ?Farrel Gordon, DO ? ?

## 2021-09-16 ENCOUNTER — Telehealth: Payer: Self-pay | Admitting: Internal Medicine

## 2021-09-16 DIAGNOSIS — Z992 Dependence on renal dialysis: Secondary | ICD-10-CM | POA: Diagnosis not present

## 2021-09-16 DIAGNOSIS — N2581 Secondary hyperparathyroidism of renal origin: Secondary | ICD-10-CM | POA: Diagnosis not present

## 2021-09-16 DIAGNOSIS — N186 End stage renal disease: Secondary | ICD-10-CM | POA: Diagnosis not present

## 2021-09-16 LAB — URINE CULTURE: Culture: 10000 — AB

## 2021-09-16 LAB — CERVICOVAGINAL ANCILLARY ONLY
Bacterial Vaginitis (gardnerella): POSITIVE — AB
Candida Glabrata: NEGATIVE
Candida Vaginitis: NEGATIVE
Chlamydia: NEGATIVE
Comment: NEGATIVE
Comment: NEGATIVE
Comment: NEGATIVE
Comment: NEGATIVE
Comment: NEGATIVE
Comment: NORMAL
Neisseria Gonorrhea: NEGATIVE
Trichomonas: POSITIVE — AB

## 2021-09-16 LAB — FECAL OCCULT BLOOD, IMMUNOCHEMICAL: Fecal Occult Bld: NEGATIVE

## 2021-09-16 NOTE — Progress Notes (Signed)
Internal Medicine Clinic Attending ? ?Case discussed with Dr. Dean  At the time of the visit.  We reviewed the resident?s history and exam and pertinent patient test results.  I agree with the assessment, diagnosis, and plan of care documented in the resident?s note.  ?

## 2021-09-16 NOTE — Assessment & Plan Note (Signed)
Patient brought fit test in today. ?

## 2021-09-16 NOTE — Telephone Encounter (Signed)
Attempted to contact patient regarding lab results from yesterday's visit. She was positive for trichomonas and bacterial vaginosis, and I have sent in metronidazole 500 mg twice daily for 7 days for her to take. Call back number provided.  ? ?Farrel Gordon, DO ?

## 2021-09-16 NOTE — Assessment & Plan Note (Addendum)
Patient explains that she has had a tingling, sort of itch, sensation near her urethra for some time now and she is unable to quantify the length of time she has been experiencing this.  She also reports persistent odor despite showers but denies any discharge.  She is also had increased urgency with minimal urine output described by pressure sensation at times when she sits down to attempt using the restroom.  She has also had some abdominal cramps but does say that she is prone to cramping.  She denies fevers, chills, GI upset.  She has inspected her genitalia with assistance of a mirror and has not noticed any abnormal bumps, irritation, discharge, redness, swelling.  She has not had new sexual partners.  There is no CVA or abdominal tenderness. ? ?She was last treated for UTI in August 2022 with ceftriaxone for 7 days. ?Assessment: Patient has difficulty providing urine sample despite previously producing a good amount of urine.  She is ESRD on HD.  Her symptoms could fit both a acute cystitis and STD symptom profile. ?Plan: Patient performed self swab and I will call her with these results and further management as indicated.  We were able to collect a very small amount of urine and we will attempt to use this for testing for acute cystitis. ? ?ADDENDUM: Several abnormalities noted on UA, culture, vaginal swab: ?UA microscopic w/ relfex: >500 glucose, large Hgb with 0-5 RBC, 11-20 WBC, trace ketones, >300 protein, small leukocytes, many bacteria, 6-10 squamous epithelial cells, and trichomonas present. Culture was significant for 10,000 colonies of GBS. ?Vaginal swab: Positive for trichomonas, bacterial vaginosis. ? ?UA is concerning for a glomerulonephritis given large protein, and it is interesting that she is spilling so much glucose given she is not diabetic. She has large hemoglobin without RBCs which does raise concern for myoglobinuria however I have no reason to suspect a rhabdomyolysis picture at this  time. Overall, given the fact that there are only 10,000 colonies of GBS, many squamous epithelial cells, and trichomonas was picked up on the UA, I feel this was likely a dirty sample. It was also a very small, as in hardly several drops, sample. Given her ESRD I am not very surprised at these findings overall. Her urinary urgency with little output may be due to a combination of her ESRD moving into an oliguric/anuric phase combined with vaginal infection. ? ?I have sent in metronidazole 500 mg BID for 7 days. She has also been counseled that her partner(s) need to be tested/treated as well.  ?

## 2021-09-16 NOTE — Assessment & Plan Note (Addendum)
Patient states that she has had severe itching over her left AKA stump to the point of almost breaking the skin from aggressive scratching.  She has spoke with her orthopedist and was told that it was likely related to the fit for prosthetic and she did get a new prosthetic today.  She has been applying coconut oil over the area but this only helps the dry appearance of the skin.  She does apply antibiotic ointment over the areas where she has created breaks in the skin from scratching. ? ?The stump is without increased warmth or erythema but the skin is ashen and dry appearing with few excoriations. ?Assessment: I am concerned for tinea corporis affecting her stump. ?Plan: I have sent in ketoconazole cream for daily application. ?

## 2021-09-17 ENCOUNTER — Ambulatory Visit: Payer: Medicare HMO

## 2021-09-17 ENCOUNTER — Telehealth: Payer: Self-pay

## 2021-09-17 DIAGNOSIS — R2689 Other abnormalities of gait and mobility: Secondary | ICD-10-CM

## 2021-09-17 MED ORDER — METRONIDAZOLE 500 MG PO TABS: 500.0000 mg | ORAL_TABLET | Freq: Two times a day (BID) | ORAL | 0 refills | Status: AC

## 2021-09-17 NOTE — Therapy (Signed)
?OUTPATIENT PHYSICAL THERAPY TREATMENT NOTE- arrived no charge ? ? ?Patient Name: [name] [name] ?MRN: [id number] ?DOB:1975-12-16, 46 y.o., female ?Today's Date: 09/17/2021 ? ?PCP: Riesa Pope, MD ?REFERRING PROVIDER: Riesa Pope, * ? ? PT End of Session - 09/17/21 1027   ? ? Visit Number 7   ? Number of Visits 17   ? Date for PT Re-Evaluation 10/08/21   ? Authorization Type [employer] medicare with medicaid secondary. Auth requested   ? Authorization Time Period 17 visits 08/09/21-10/08/21   ? Authorization - Visit Number 7   ? Authorization - Number of Visits 17   ? Progress Note Due on Visit 10   ? PT Start Time 1020   arrived no charge  ? PT Stop Time 1035   ? PT Time Calculation (min) 15 min   ? Equipment Utilized During Treatment Gait belt   ? Activity Tolerance Patient tolerated treatment well   ? Behavior During Therapy [facility] for tasks assessed/performed   ? ?  ?  ? ?  ? ? ?Past Medical History:  ?Diagnosis Date  ? Abdominal muscle pain 09/08/2016  ? Abnormal Pap smear of cervix 2009  ? Abscess   ? history of multiple abscesses  ? Acute bilateral low back pain 02/13/2017  ? Acute blood loss anemia   ? Acute on chronic renal failure ([place]) 07/12/2012  ? Acute renal failure ([place]) 07/12/2012  ? Acute respiratory failure with hypoxia ([place]) 10/21/2019  ? Adjustment disorder with depressed mood 03/26/2017  ? AKI (acute kidney injury) ([place])   ? Anemia 10/21/2019  ? Anemia of chronic disease 2002  ? Anginal pain ([place])   ? Anxiety   ? Panic attacks  ? Bilateral lower extremity edema 05/13/2016  ? Bipolar disorder ([place])   ? Cataract   ? B/L cataract  ? Cellulitis 05/21/2014  ? right eye  ? CHF (congestive heart failure) ([place])   ? Chronic bronchitis ([place])   ? "get it q yr" (05/13/2013)  ? Chronic pain   ? Chronic pain of right knee 09/08/2016  ? Dehiscence of amputation stump (HCC)   ? Depression   ? Edema of lower extremity   ? Endocarditis 2002  ? subacute bacterial endocarditis.   ? Fall   ? Family  history of anesthesia complication   ? "my mom has a hard time coming out from [place]"  ? Fibromyalgia   ? GERD (gastroesophageal reflux disease)   ? occ  ? Heart murmur   ? Herpes simplex type 1 infection 01/16/2018  ? History of blood transfusion   ? "just low blood count" (05/13/2013)  ? Hyperlipidemia   ? Hypertension   ? Hypothyroidism   ? Hypothyroidism, adult 03/21/2014  ? Hypoxia   ? Leukocytosis   ? Muscle spasm 12/16/2020  ? Necrosis ([place])   ? and ulceration  ? Necrotizing fasciitis s/p OR debridements 07/06/2012  ? Obesity   ? OSA on CPAP   ? does not wear all the time  ? Peripheral neuropathy   ? Pneumonia   ? Pyelonephritis 12/31/2020  ? Routine adult health maintenance 12/08/2016  ? Severe protein-calorie malnutrition ([place])   ? Status post below knee amputation, left ([place]) 04/11/2017  ? Type II diabetes mellitus ([place])   ? Type  II  ? Vaginal irritation 03/14/2016  ? Weak pulse 07/21/2021  ? Wound dehiscence 09/04/2019  ? ?Past Surgical History:  ?Procedure Laterality Date  ? A/V FISTULAGRAM Left 05/12/2021  ? Procedure: A/V FISTULAGRAM;  Surgeon: Virl Cagey,  Carolann Littler, MD;  Location: [place] [facility];  Service: Cardiovascular;  Laterality: Left;  ? ABDOMINAL AORTOGRAM W/LOWER EXTREMITY Left 03/19/2019  ? Procedure: ABDOMINAL AORTOGRAM W/LOWER EXTREMITY;  Surgeon: Serafina Mitchell, MD;  Location: [place] [facility];  Service: Cardiovascular;  Laterality: Left;  ? ABOVE KNEE LEG AMPUTATION Left 10/11/2019  ? wound dehisence   ? AIR/FLUID EXCHANGE Right 12/31/2019  ? Procedure: AIR/FLUID EXCHANGE;  Surgeon: Jalene Mullet, MD;  Location: [place];  Service: Ophthalmology;  Laterality: Right;  ? AMPUTATION Left 04/07/2017  ? Procedure: LEFT BELOW KNEE AMPUTATION;  Surgeon: Newt Minion, MD;  Location: [place];  Service: Orthopedics;  Laterality: Left;  ? AMPUTATION Left 10/11/2019  ? Procedure: LEFT ABOVE KNEE AMPUTATION;  Surgeon: Newt Minion, MD;  Location: [place];  Service: Orthopedics;  Laterality: Left;  ? AV  FISTULA PLACEMENT Left 01/05/2021  ? Procedure: ARTERIOVENOUS (AV) FISTULA CREATION vs. GRAFT;  Surgeon: Angelia Mould, MD;  Location: [place];  Service: Vascular;  Laterality: Left;  ? CATARACT EXTRACTION W/PHACO Right 02/11/2020  ? Procedure: CATARACT EXTRACTION PHACO AND INTRAOCULAR LENS PLACEMENT (IOC);  Surgeon: Jalene Mullet, MD;  Location: [place];  Service: Ophthalmology;  Laterality: Right;  ? EYE SURGERY    ? lazer  ? FISTULA SUPERFICIALIZATION Left 02/23/2021  ? Procedure: SUPERFICIALIZATION OF LEFT BRACHIOCEPHALIC FISTULA;  Surgeon: Angelia Mould, MD;  Location: [place];  Service: Vascular;  Laterality: Left;  ? GAS INSERTION Right 12/31/2019  ? Procedure: INSERTION OF GAS;  Surgeon: Jalene Mullet, MD;  Location: [place];  Service: Ophthalmology;  Laterality: Right;  ? INCISION AND DRAINAGE ABSCESS    ? multiple I&Ds  ? INCISION AND DRAINAGE ABSCESS Left 07/09/2012  ? Procedure: DRESSING CHANGE, THIGH WOUND;  Surgeon: Harl Bowie, MD;  Location: [place];  Service: General;  Laterality: Left;  ? INCISION AND DRAINAGE OF WOUND Left 07/07/2012  ? Procedure: IRRIGATION AND DEBRIDEMENT WOUND;  Surgeon: Harl Bowie, MD;  Location: [place];  Service: General;  Laterality: Left;  ? INCISION AND DRAINAGE PERIRECTAL ABSCESS Left 07/14/2012  ? Procedure: DEBRIDEMENT OF SKIN & SOFT TISSUE; DRESSING CHANGE UNDER ANESTHESIA;  Surgeon: Gayland Curry, MD,FACS;  Location: [place];  Service: General;  Laterality: Left;  ? INCISION AND DRAINAGE PERIRECTAL ABSCESS Left 07/16/2012  ? Procedure: I&D Left Thigh;  Surgeon: Gwenyth Ober, MD;  Location: [place];  Service: General;  Laterality: Left;  ? INCISION AND DRAINAGE PERIRECTAL ABSCESS N/A 01/05/2015  ? Procedure: IRRIGATION AND DEBRIDEMENT PERIRECTAL ABSCESS;  Surgeon: Donnie Mesa, MD;  Location: [place];  Service: General;  Laterality: N/A;  ? INSERTION OF DIALYSIS CATHETER Right 01/05/2021  ? Procedure: INSERTION OF DIALYSIS CATHETER;  Surgeon:  Angelia Mould, MD;  Location: [facility];  Service: Vascular;  Laterality: Right;  ? IRRIGATION AND DEBRIDEMENT ABSCESS Left 07/06/2012  ? Procedure: IRRIGATION AND DEBRIDEMENT ABSCESS BUTTOCKS AND THIGH;  Surgeon: Shann Medal, MD;  Location: [place];  Service: General;  Laterality: Left;  ? IRRIGATION AND DEBRIDEMENT ABSCESS Left 08/10/2012  ? Procedure: IRRIGATION AND DEBRIDEMENT ABSCESS;  Surgeon: Madilyn Hook, DO;  Location: [place];  Service: General;  Laterality: Left;  ? PARS PLANA VITRECTOMY Right 12/31/2019  ? Procedure: PARS PLANA VITRECTOMY WITH 25 GAUGE;  Surgeon: Jalene Mullet, MD;  Location: [place];  Service: Ophthalmology;  Laterality: Right;  ? PERIPHERAL VASCULAR INTERVENTION Left 05/12/2021  ? Procedure: PERIPHERAL VASCULAR INTERVENTION;  Surgeon: Broadus John, MD;  Location: [facility]  INVASIVE [facility];  Service: Cardiovascular;  Laterality: Left;  arm fistula  ? PHOTOCOAGULATION WITH LASER Right 12/31/2019  ? Procedure: PHOTOCOAGULATION WITH LASER;  Surgeon: Jalene Mullet, MD;  Location: [place];  Service: Ophthalmology;  Laterality: Right;  ? REPAIR OF COMPLEX TRACTION RETINAL DETACHMENT Right 12/31/2019  ? Procedure: REPAIR OF COMPLEX TRACTION RETINAL;  Surgeon: Jalene Mullet, MD;  Location: [place];  Service: Ophthalmology;  Laterality: Right;  ? STUMP REVISION Left 06/09/2017  ? Procedure: REVISION LEFT BELOW KNEE AMPUTATION;  Surgeon: Newt Minion, MD;  Location: [place];  Service: Orthopedics;  Laterality: Left;  ? STUMP REVISION Left 09/04/2019  ? Procedure: LEFT BELOW KNEE AMPUTATION REVISION;  Surgeon: Newt Minion, MD;  Location: [place];  Service: Orthopedics;  Laterality: Left;  ? STUMP REVISION Left 09/20/2019  ? Procedure: LEFT BELOW KNEE AMPUTATION REVISION;  Surgeon: Newt Minion, MD;  Location: [place];  Service: Orthopedics;  Laterality: Left;  ? TONSILLECTOMY  1994  ? ?Patient Active Problem List  ? Diagnosis Date Noted  ? Urgency of urination 09/12/2021  ? Hidradenitis  suppurativa 02/24/2021  ? Cough 05/26/2020  ? Chronic pain syndrome 01/16/2020  ? Hx of AKA (above knee amputation), left ([place])   ? Blurry vision, right eye 10/08/2019  ? Tobacco use 03/01/2019  ? Phantom limb pain ([place]

## 2021-09-17 NOTE — Telephone Encounter (Signed)
Pt is requesting a call back ... she is retrying to get her results from her last OV .Marland Kitchen pt can be reached on her home number the cell is only good for text messages  ?

## 2021-09-17 NOTE — Telephone Encounter (Signed)
Hi Dr Marlou Sa ?I can call the pt and inform her of her results if she did not received your My Chart message. Also I do not see Metronidazole on pt's med list - I want to be sure it was ordered. ?Thanks ?

## 2021-09-17 NOTE — Telephone Encounter (Signed)
Pt called - no answer; left message to call me back. ?Return call from pt - stated she did not get a My Chart message but she did see the results. Informed pt, Dr Marlou Sa called her yesterday but no answer. Informed pt she's + for Trichomonas and BV and medication, Flagyl, has been ordered - take 1 tab BID x 7 days. Also informed her partner needs to be tested and treated as well. Stated she understands. ?

## 2021-09-17 NOTE — Telephone Encounter (Signed)
Thank you so much

## 2021-09-17 NOTE — Addendum Note (Signed)
Addended by: Renato Battles on: 09/17/2021 10:40 AM ? ? Modules accepted: Orders ? ?

## 2021-09-18 DIAGNOSIS — N2581 Secondary hyperparathyroidism of renal origin: Secondary | ICD-10-CM | POA: Diagnosis not present

## 2021-09-18 DIAGNOSIS — N186 End stage renal disease: Secondary | ICD-10-CM | POA: Diagnosis not present

## 2021-09-18 DIAGNOSIS — Z992 Dependence on renal dialysis: Secondary | ICD-10-CM | POA: Diagnosis not present

## 2021-09-19 DIAGNOSIS — E1122 Type 2 diabetes mellitus with diabetic chronic kidney disease: Secondary | ICD-10-CM | POA: Diagnosis not present

## 2021-09-19 DIAGNOSIS — N186 End stage renal disease: Secondary | ICD-10-CM | POA: Diagnosis not present

## 2021-09-19 DIAGNOSIS — Z992 Dependence on renal dialysis: Secondary | ICD-10-CM | POA: Diagnosis not present

## 2021-09-20 ENCOUNTER — Other Ambulatory Visit: Payer: Self-pay | Admitting: Student

## 2021-09-20 NOTE — Telephone Encounter (Signed)
Last rx written 08/24/21. ?Last OV 4/26 with Dr Marlou Sa. ?Next OV- has not been scheduled. ?UDS - has not been done. ? ? ? ?

## 2021-09-21 ENCOUNTER — Other Ambulatory Visit: Payer: Self-pay | Admitting: Internal Medicine

## 2021-09-21 DIAGNOSIS — Z89612 Acquired absence of left leg above knee: Secondary | ICD-10-CM

## 2021-09-21 DIAGNOSIS — N186 End stage renal disease: Secondary | ICD-10-CM | POA: Diagnosis not present

## 2021-09-21 DIAGNOSIS — N2581 Secondary hyperparathyroidism of renal origin: Secondary | ICD-10-CM | POA: Diagnosis not present

## 2021-09-21 DIAGNOSIS — Z992 Dependence on renal dialysis: Secondary | ICD-10-CM | POA: Diagnosis not present

## 2021-09-21 NOTE — Telephone Encounter (Signed)
Patient picked up Ketoconazole on 09/17/2021.  Does not have any refills left per Pharmacy. ?

## 2021-09-22 ENCOUNTER — Other Ambulatory Visit: Payer: Self-pay | Admitting: *Deleted

## 2021-09-22 ENCOUNTER — Ambulatory Visit: Payer: Medicare HMO | Admitting: Physical Therapy

## 2021-09-23 DIAGNOSIS — N186 End stage renal disease: Secondary | ICD-10-CM | POA: Diagnosis not present

## 2021-09-23 DIAGNOSIS — Z992 Dependence on renal dialysis: Secondary | ICD-10-CM | POA: Diagnosis not present

## 2021-09-23 DIAGNOSIS — N2581 Secondary hyperparathyroidism of renal origin: Secondary | ICD-10-CM | POA: Diagnosis not present

## 2021-09-23 MED ORDER — TRUEPLUS PEN NEEDLES 31G X 5 MM MISC: 1.0000 | Freq: Three times a day (TID) | 5 refills | Status: AC

## 2021-09-24 ENCOUNTER — Ambulatory Visit: Payer: Medicare HMO | Admitting: Physical Therapy

## 2021-09-25 DIAGNOSIS — N2581 Secondary hyperparathyroidism of renal origin: Secondary | ICD-10-CM | POA: Diagnosis not present

## 2021-09-25 DIAGNOSIS — Z992 Dependence on renal dialysis: Secondary | ICD-10-CM | POA: Diagnosis not present

## 2021-09-25 DIAGNOSIS — N186 End stage renal disease: Secondary | ICD-10-CM | POA: Diagnosis not present

## 2021-09-28 DIAGNOSIS — N2581 Secondary hyperparathyroidism of renal origin: Secondary | ICD-10-CM | POA: Diagnosis not present

## 2021-09-28 DIAGNOSIS — N186 End stage renal disease: Secondary | ICD-10-CM | POA: Diagnosis not present

## 2021-09-28 DIAGNOSIS — Z992 Dependence on renal dialysis: Secondary | ICD-10-CM | POA: Diagnosis not present

## 2021-09-29 ENCOUNTER — Ambulatory Visit
Payer: Medicare HMO | Attending: Student in an Organized Health Care Education/Training Program | Admitting: Rehabilitation

## 2021-09-29 DIAGNOSIS — R293 Abnormal posture: Secondary | ICD-10-CM | POA: Insufficient documentation

## 2021-09-29 DIAGNOSIS — R2681 Unsteadiness on feet: Secondary | ICD-10-CM | POA: Insufficient documentation

## 2021-09-29 DIAGNOSIS — G8929 Other chronic pain: Secondary | ICD-10-CM | POA: Insufficient documentation

## 2021-09-29 DIAGNOSIS — M25561 Pain in right knee: Secondary | ICD-10-CM | POA: Insufficient documentation

## 2021-09-29 DIAGNOSIS — M6281 Muscle weakness (generalized): Secondary | ICD-10-CM | POA: Insufficient documentation

## 2021-09-29 DIAGNOSIS — R2689 Other abnormalities of gait and mobility: Secondary | ICD-10-CM | POA: Insufficient documentation

## 2021-09-29 NOTE — Progress Notes (Incomplete)
Patient Name: [name] [name] ?MRN: [id number] ?DOB:[birth year], 46 y.o., female ?Today's Date: 09/29/2021 ? ?*** ? ? ?Kerrie Pleasure, PT ?09/29/2021, 9:54 AM ? ?

## 2021-09-30 DIAGNOSIS — Z992 Dependence on renal dialysis: Secondary | ICD-10-CM | POA: Diagnosis not present

## 2021-09-30 DIAGNOSIS — N186 End stage renal disease: Secondary | ICD-10-CM | POA: Diagnosis not present

## 2021-09-30 DIAGNOSIS — N2581 Secondary hyperparathyroidism of renal origin: Secondary | ICD-10-CM | POA: Diagnosis not present

## 2021-10-01 ENCOUNTER — Encounter: Payer: Self-pay | Admitting: Physical Therapy

## 2021-10-01 ENCOUNTER — Ambulatory Visit: Payer: Medicare HMO | Admitting: Physical Therapy

## 2021-10-01 DIAGNOSIS — R2681 Unsteadiness on feet: Secondary | ICD-10-CM

## 2021-10-01 DIAGNOSIS — R293 Abnormal posture: Secondary | ICD-10-CM

## 2021-10-01 DIAGNOSIS — M25561 Pain in right knee: Secondary | ICD-10-CM | POA: Diagnosis not present

## 2021-10-01 DIAGNOSIS — M6281 Muscle weakness (generalized): Secondary | ICD-10-CM

## 2021-10-01 DIAGNOSIS — R2689 Other abnormalities of gait and mobility: Secondary | ICD-10-CM

## 2021-10-01 DIAGNOSIS — G8929 Other chronic pain: Secondary | ICD-10-CM | POA: Diagnosis not present

## 2021-10-01 NOTE — Therapy (Signed)
?OUTPATIENT PHYSICAL THERAPY TREATMENT NOTE ? ?Patient Name: [name] [name] ?MRN: [id number] ?DOB:1976-04-22, 46 y.o., female ?Today's Date: 10/01/2021 ? ?PCP: Riesa Pope, MD ?REFERRING PROVIDER: Axel Filler,* ? ? PT End of Session - 10/01/21 1020   ? ? Visit Number 8   ? Number of Visits 17   ? Date for PT Re-Evaluation 10/08/21   ? Authorization Type [employer]are with medicaid secondary. Auth requested   ? Authorization Time Period 17 visits 08/09/21-10/08/21   ? Authorization - Visit Number 8   ? Authorization - Number of Visits 17   ? Progress Note Due on Visit 10   ? PT Start Time 1018   ? PT Stop Time 1100   ? PT Time Calculation (min) 42 min   ? Equipment Utilized During Treatment Gait belt   ? Activity Tolerance Patient tolerated treatment well   ? Behavior During Therapy [facility] for tasks assessed/performed   ? ?  ?  ? ?  ? ? ?Past Medical History:  ?Diagnosis Date  ? Abdominal muscle pain 09/08/2016  ? Abnormal Pap smear of cervix 2009  ? Abscess   ? history of multiple abscesses  ? Acute bilateral low back pain 02/13/2017  ? Acute blood loss anemia   ? Acute on chronic renal failure ([place]) 07/12/2012  ? Acute renal failure ([place]) 07/12/2012  ? Acute respiratory failure with hypoxia ([place]) 10/21/2019  ? Adjustment disorder with depressed mood 03/26/2017  ? AKI (acute kidney injury) ([place])   ? Anemia 10/21/2019  ? Anemia of chronic disease 2002  ? Anginal pain ([place])   ? Anxiety   ? Panic attacks  ? Bilateral lower extremity edema 05/13/2016  ? Bipolar disorder ([place])   ? Cataract   ? B/L cataract  ? Cellulitis 05/21/2014  ? right eye  ? CHF (congestive heart failure) ([place])   ? Chronic bronchitis ([place])   ? "get it q yr" (05/13/2013)  ? Chronic pain   ? Chronic pain of right knee 09/08/2016  ? Dehiscence of amputation stump ([facility])   ? Depression   ? Edema of lower extremity   ? Endocarditis 2002  ? subacute bacterial endocarditis.   ? Fall   ? Family history of anesthesia complication   ? "my mom  has a hard time coming out from [place]"  ? Fibromyalgia   ? GERD (gastroesophageal reflux disease)   ? occ  ? Heart murmur   ? Herpes simplex type 1 infection 01/16/2018  ? History of blood transfusion   ? "just low blood count" (05/13/2013)  ? Hyperlipidemia   ? Hypertension   ? Hypothyroidism   ? Hypothyroidism, adult 03/21/2014  ? Hypoxia   ? Leukocytosis   ? Muscle spasm 12/16/2020  ? Necrosis ([place])   ? and ulceration  ? Necrotizing fasciitis s/p OR debridements 07/06/2012  ? Obesity   ? OSA on CPAP   ? does not wear all the time  ? Peripheral neuropathy   ? Pneumonia   ? Pyelonephritis 12/31/2020  ? Routine adult health maintenance 12/08/2016  ? Severe protein-calorie malnutrition ([place])   ? Status post below knee amputation, left ([place]) 04/11/2017  ? Type II diabetes mellitus ([place])   ? Type  II  ? Vaginal irritation 03/14/2016  ? Weak pulse 07/21/2021  ? Wound dehiscence 09/04/2019  ? ?Past Surgical History:  ?Procedure Laterality Date  ? A/V FISTULAGRAM Left 05/12/2021  ? Procedure: A/V FISTULAGRAM;  Surgeon: Broadus John, MD;  Location: [facility] INVASIVE CV  LAB;  Service: Cardiovascular;  Laterality: Left;  ? ABDOMINAL AORTOGRAM W/LOWER EXTREMITY Left 03/19/2019  ? Procedure: ABDOMINAL AORTOGRAM W/LOWER EXTREMITY;  Surgeon: Serafina Mitchell, MD;  Location: [place] CV LAB;  Service: Cardiovascular;  Laterality: Left;  ? ABOVE KNEE LEG AMPUTATION Left 10/11/2019  ? wound dehisence   ? AIR/FLUID EXCHANGE Right 12/31/2019  ? Procedure: AIR/FLUID EXCHANGE;  Surgeon: Jalene Mullet, MD;  Location: [place];  Service: Ophthalmology;  Laterality: Right;  ? AMPUTATION Left 04/07/2017  ? Procedure: LEFT BELOW KNEE AMPUTATION;  Surgeon: Newt Minion, MD;  Location: [place];  Service: Orthopedics;  Laterality: Left;  ? AMPUTATION Left 10/11/2019  ? Procedure: LEFT ABOVE KNEE AMPUTATION;  Surgeon: Newt Minion, MD;  Location: [place];  Service: Orthopedics;  Laterality: Left;  ? AV FISTULA PLACEMENT Left 01/05/2021  ? Procedure:  ARTERIOVENOUS (AV) FISTULA CREATION vs. GRAFT;  Surgeon: Angelia Mould, MD;  Location: [place];  Service: Vascular;  Laterality: Left;  ? CATARACT EXTRACTION W/PHACO Right 02/11/2020  ? Procedure: CATARACT EXTRACTION PHACO AND INTRAOCULAR LENS PLACEMENT (IOC);  Surgeon: Jalene Mullet, MD;  Location: [place];  Service: Ophthalmology;  Laterality: Right;  ? EYE SURGERY    ? lazer  ? FISTULA SUPERFICIALIZATION Left 02/23/2021  ? Procedure: SUPERFICIALIZATION OF LEFT BRACHIOCEPHALIC FISTULA;  Surgeon: Angelia Mould, MD;  Location: [place];  Service: Vascular;  Laterality: Left;  ? GAS INSERTION Right 12/31/2019  ? Procedure: INSERTION OF GAS;  Surgeon: Jalene Mullet, MD;  Location: [place];  Service: Ophthalmology;  Laterality: Right;  ? INCISION AND DRAINAGE ABSCESS    ? multiple I&Ds  ? INCISION AND DRAINAGE ABSCESS Left 07/09/2012  ? Procedure: DRESSING CHANGE, THIGH WOUND;  Surgeon: Harl Bowie, MD;  Location: [place];  Service: General;  Laterality: Left;  ? INCISION AND DRAINAGE OF WOUND Left 07/07/2012  ? Procedure: IRRIGATION AND DEBRIDEMENT WOUND;  Surgeon: Harl Bowie, MD;  Location: [place];  Service: General;  Laterality: Left;  ? INCISION AND DRAINAGE PERIRECTAL ABSCESS Left 07/14/2012  ? Procedure: DEBRIDEMENT OF SKIN & SOFT TISSUE; DRESSING CHANGE UNDER ANESTHESIA;  Surgeon: Gayland Curry, MD,FACS;  Location: [place];  Service: General;  Laterality: Left;  ? INCISION AND DRAINAGE PERIRECTAL ABSCESS Left 07/16/2012  ? Procedure: I&D Left Thigh;  Surgeon: Gwenyth Ober, MD;  Location: [place];  Service: General;  Laterality: Left;  ? INCISION AND DRAINAGE PERIRECTAL ABSCESS N/A 01/05/2015  ? Procedure: IRRIGATION AND DEBRIDEMENT PERIRECTAL ABSCESS;  Surgeon: Donnie Mesa, MD;  Location: [place];  Service: General;  Laterality: N/A;  ? INSERTION OF DIALYSIS CATHETER Right 01/05/2021  ? Procedure: INSERTION OF DIALYSIS CATHETER;  Surgeon: Angelia Mould, MD;  Location[facility];   Service: Vascular;  Laterality: Right;  ? IRRIGATION AND DEBRIDEMENT ABSCESS Left 07/06/2012  ? Procedure: IRRIGATION AND DEBRIDEMENT ABSCESS BUTTOCKS AND THIGH;  Surgeon: Shann Medal, MD;  Location: [place];  Service: General;  Laterality: Left;  ? IRRIGATION AND DEBRIDEMENT ABSCESS Left 08/10/2012  ? Procedure: IRRIGATION AND DEBRIDEMENT ABSCESS;  Surgeon: Madilyn Hook, DO;  Location: [place];  Service: General;  Laterality: Left;  ? PARS PLANA VITRECTOMY Right 12/31/2019  ? Procedure: PARS PLANA VITRECTOMY WITH 25 GAUGE;  Surgeon: Jalene Mullet, MD;  Location: [place];  Service: Ophthalmology;  Laterality: Right;  ? PERIPHERAL VASCULAR INTERVENTION Left 05/12/2021  ? Procedure: PERIPHERAL VASCULAR INTERVENTION;  Surgeon: Broadus John, MD;  Location: [place] CV LAB;  Service: Cardiovascular;  Laterality:  Left;  arm fistula  ? PHOTOCOAGULATION WITH LASER Right 12/31/2019  ? Procedure: PHOTOCOAGULATION WITH LASER;  Surgeon: Jalene Mullet, MD;  Location: [place];  Service: Ophthalmology;  Laterality: Right;  ? REPAIR OF COMPLEX TRACTION RETINAL DETACHMENT Right 12/31/2019  ? Procedure: REPAIR OF COMPLEX TRACTION RETINAL;  Surgeon: Jalene Mullet, MD;  Location: [place];  Service: Ophthalmology;  Laterality: Right;  ? STUMP REVISION Left 06/09/2017  ? Procedure: REVISION LEFT BELOW KNEE AMPUTATION;  Surgeon: Newt Minion, MD;  Location: [place];  Service: Orthopedics;  Laterality: Left;  ? STUMP REVISION Left 09/04/2019  ? Procedure: LEFT BELOW KNEE AMPUTATION REVISION;  Surgeon: Newt Minion, MD;  Location: [place];  Service: Orthopedics;  Laterality: Left;  ? STUMP REVISION Left 09/20/2019  ? Procedure: LEFT BELOW KNEE AMPUTATION REVISION;  Surgeon: Newt Minion, MD;  Location: [place];  Service: Orthopedics;  Laterality: Left;  ? TONSILLECTOMY  1994  ? ?Patient Active Problem List  ? Diagnosis Date Noted  ? Urgency of urination 09/12/2021  ? Hidradenitis suppurativa 02/24/2021  ? Cough 05/26/2020  ? Chronic  pain syndrome 01/16/2020  ? Hx of AKA (above knee amputation), left ([place])   ? Blurry vision, right eye 10/08/2019  ? Tobacco use 03/01/2019  ? Phantom limb pain ([place]) 11/08/2018  ? Current moderate episode

## 2021-10-02 DIAGNOSIS — N2581 Secondary hyperparathyroidism of renal origin: Secondary | ICD-10-CM | POA: Diagnosis not present

## 2021-10-02 DIAGNOSIS — Z992 Dependence on renal dialysis: Secondary | ICD-10-CM | POA: Diagnosis not present

## 2021-10-02 DIAGNOSIS — N186 End stage renal disease: Secondary | ICD-10-CM | POA: Diagnosis not present

## 2021-10-05 DIAGNOSIS — N186 End stage renal disease: Secondary | ICD-10-CM | POA: Diagnosis not present

## 2021-10-05 DIAGNOSIS — N2581 Secondary hyperparathyroidism of renal origin: Secondary | ICD-10-CM | POA: Diagnosis not present

## 2021-10-05 DIAGNOSIS — Z992 Dependence on renal dialysis: Secondary | ICD-10-CM | POA: Diagnosis not present

## 2021-10-06 ENCOUNTER — Other Ambulatory Visit: Payer: Self-pay | Admitting: Student

## 2021-10-06 ENCOUNTER — Encounter: Payer: Medicare HMO | Admitting: Rehabilitation

## 2021-10-06 DIAGNOSIS — F32A Depression, unspecified: Secondary | ICD-10-CM

## 2021-10-07 DIAGNOSIS — N2581 Secondary hyperparathyroidism of renal origin: Secondary | ICD-10-CM | POA: Diagnosis not present

## 2021-10-07 DIAGNOSIS — N186 End stage renal disease: Secondary | ICD-10-CM | POA: Diagnosis not present

## 2021-10-07 DIAGNOSIS — Z992 Dependence on renal dialysis: Secondary | ICD-10-CM | POA: Diagnosis not present

## 2021-10-08 ENCOUNTER — Encounter: Payer: Self-pay | Admitting: Physical Therapy

## 2021-10-08 ENCOUNTER — Encounter: Payer: Medicare HMO | Admitting: Physical Therapy

## 2021-10-08 ENCOUNTER — Ambulatory Visit: Payer: Medicare HMO | Admitting: Physical Therapy

## 2021-10-08 ENCOUNTER — Ambulatory Visit: Payer: Medicare HMO

## 2021-10-08 DIAGNOSIS — M25561 Pain in right knee: Secondary | ICD-10-CM | POA: Diagnosis not present

## 2021-10-08 DIAGNOSIS — G8929 Other chronic pain: Secondary | ICD-10-CM

## 2021-10-08 DIAGNOSIS — R2681 Unsteadiness on feet: Secondary | ICD-10-CM

## 2021-10-08 DIAGNOSIS — R2689 Other abnormalities of gait and mobility: Secondary | ICD-10-CM

## 2021-10-08 DIAGNOSIS — M6281 Muscle weakness (generalized): Secondary | ICD-10-CM

## 2021-10-08 DIAGNOSIS — R293 Abnormal posture: Secondary | ICD-10-CM

## 2021-10-08 NOTE — Therapy (Signed)
OUTPATIENT PHYSICAL THERAPY TREATMENT NOTE  Patient Name: [name] [name] MRN: [id number] DOB:15-Mar-1976, 46 y.o., female Today's Date: 10/08/2021  PCP: Riesa Pope, MD REFERRING PROVIDER: Riesa Pope, *   PT End of Session - 10/08/21 1108     Visit Number 9    Number of Visits 17    Date for PT Re-Evaluation 10/08/21    Authorization Type [employer] medic[employer] with medicaid secondary. new auth to be submitted with recert at next session after 10/08/21    Authorization Time Period 17 visits 08/09/21-10/08/21    Authorization - Visit Number 9    Authorization - Number of Visits 17    Progress Note Due on Visit 10    PT Start Time 1105    PT Stop Time 1145    PT Time Calculation (min) 40 min    Equipment Utilized During Treatment Gait belt    Activity Tolerance Patient tolerated treatment well    Behavior During Therapy [facility] for tasks assessed/performed             Past Medical History:  Diagnosis Date   Abdominal muscle pain 09/08/2016   Abnormal Pap smear of cervix 2009   Abscess    history of multiple abscesses   Acute bilateral low back pain 02/13/2017   Acute blood loss anemia    Acute on chronic renal failure ([place]) 07/12/2012   Acute renal failure ([place]) 07/12/2012   Acute respiratory failure with hypoxia ([place]) 10/21/2019   Adjustment disorder with depressed mood 03/26/2017   AKI (acute kidney injury) ([place])    Anemia 10/21/2019   Anemia of chronic disease 2002   Anginal pain (HCC)    Anxiety    Panic attacks   Bilateral lower extremity edema 05/13/2016   Bipolar disorder ([place])    Cataract    B/L cataract   Cellulitis 05/21/2014   right eye   CHF (congestive heart failure) ([place])    Chronic bronchitis ([place])    "get it q yr" (05/13/2013)   Chronic pain    Chronic pain of right knee 09/08/2016   Dehiscence of amputation stump ([place])    Depression    Edema of lower extremity    Endocarditis 2002   subacute bacterial endocarditis.    Fall     Family history of anesthesia complication    "my mom has a hard time coming out from under"   Fibromyalgia    GERD (gastroesophageal reflux disease)    occ   Heart murmur    Herpes simplex type 1 infection 01/16/2018   History of blood transfusion    "just low blood count" (05/13/2013)   Hyperlipidemia    Hypertension    Hypothyroidism    Hypothyroidism, adult 03/21/2014   Hypoxia    Leukocytosis    Muscle spasm 12/16/2020   Necrosis ([place])    and ulceration   Necrotizing fasciitis s/p OR debridements 07/06/2012   Obesity    OSA on CPAP    does not wear all the time   Peripheral neuropathy    Pneumonia    Pyelonephritis 12/31/2020   Routine adult health maintenance 12/08/2016   Severe protein-calorie malnutrition ([place])    Status post below knee amputation, left ([place]) 04/11/2017   Type II diabetes mellitus (HCC)    Type  II   Vaginal irritation 03/14/2016   Weak pulse 07/21/2021   Wound dehiscence 09/04/2019   Past Surgical History:  Procedure Laterality Date   A/V FISTULAGRAM Left 05/12/2021   Procedure: A/V FISTULAGRAM;  Surgeon: Broadus John, MD;  Location: [place] [facility];  Service: Cardiovascular;  Laterality: Left;   ABDOMINAL AORTOGRAM W/LOWER EXTREMITY Left 03/19/2019   Procedure: ABDOMINAL AORTOGRAM W/LOWER EXTREMITY;  Surgeon: Serafina Mitchell, MD;  Location: [place] [facility];  Service: Cardiovascular;  Laterality: Left;   ABOVE KNEE LEG AMPUTATION Left 10/11/2019   wound dehisence    AIR/FLUID EXCHANGE Right 12/31/2019   Procedure: AIR/FLUID EXCHANGE;  Surgeon: Jalene Mullet, MD;  Location: [place];  Service: Ophthalmology;  Laterality: Right;   AMPUTATION Left 04/07/2017   Procedure: LEFT BELOW KNEE AMPUTATION;  Surgeon: Newt Minion, MD;  Location: [place];  Service: Orthopedics;  Laterality: Left;   AMPUTATION Left 10/11/2019   Procedure: LEFT ABOVE KNEE AMPUTATION;  Surgeon: Newt Minion, MD;  Location: [place];  Service: Orthopedics;  Laterality: Left;    AV FISTULA PLACEMENT Left 01/05/2021   Procedure: ARTERIOVENOUS (AV) FISTULA CREATION vs. GRAFT;  Surgeon: Angelia Mould, MD;  Location: [facility];  Service: Vascular;  Laterality: Left;   CATARACT EXTRACTION W/PHACO Right 02/11/2020   Procedure: CATARACT EXTRACTION PHACO AND INTRAOCULAR LENS PLACEMENT ([place]);  Surgeon: Jalene Mullet, MD;  Location: [place];  Service: Ophthalmology;  Laterality: Right;   EYE SURGERY     lazer   FISTULA SUPERFICIALIZATION Left 02/23/2021   Procedure: SUPERFICIALIZATION OF LEFT BRACHIOCEPHALIC FISTULA;  Surgeon: Angelia Mould, MD;  Location: [place];  Service: Vascular;  Laterality: Left;   GAS INSERTION Right 12/31/2019   Procedure: INSERTION OF GAS;  Surgeon: Jalene Mullet, MD;  Location: [place];  Service: Ophthalmology;  Laterality: Right;   INCISION AND DRAINAGE ABSCESS     multiple I&Ds   INCISION AND DRAINAGE ABSCESS Left 07/09/2012   Procedure: DRESSING CHANGE, THIGH WOUND;  Surgeon: Harl Bowie, MD;  Location: [place];  Service: General;  Laterality: Left;   INCISION AND DRAINAGE OF WOUND Left 07/07/2012   Procedure: IRRIGATION AND DEBRIDEMENT WOUND;  Surgeon: Harl Bowie, MD;  Location: [place];  Service: General;  Laterality: Left;   INCISION AND DRAINAGE PERIRECTAL ABSCESS Left 07/14/2012   Procedure: DEBRIDEMENT OF SKIN & SOFT TISSUE; DRESSING CHANGE UNDER ANESTHESIA;  Surgeon: Gayland Curry, MD,FACS;  Location: [place];  Service: General;  Laterality: Left;   INCISION AND DRAINAGE PERIRECTAL ABSCESS Left 07/16/2012   Procedure: I&D Left Thigh;  Surgeon: Gwenyth Ober, MD;  Location: [place];  Service: General;  Laterality: Left;   INCISION AND DRAINAGE PERIRECTAL ABSCESS N/A 01/05/2015   Procedure: IRRIGATION AND DEBRIDEMENT PERIRECTAL ABSCESS;  Surgeon: Donnie Mesa, MD;  Location: [place];  Service: General;  Laterality: N/A;   INSERTION OF DIALYSIS CATHETER Right 01/05/2021   Procedure: INSERTION OF DIALYSIS CATHETER;  Surgeon:  Angelia Mould, MD;  Location: [place];  Service: Vascular;  Laterality: Right;   IRRIGATION AND DEBRIDEMENT ABSCESS Left 07/06/2012   Procedure: IRRIGATION AND DEBRIDEMENT ABSCESS BUTTOCKS AND THIGH;  Surgeon: Shann Medal, MD;  Location: [place];  Service: General;  Laterality: Left;   IRRIGATION AND DEBRIDEMENT ABSCESS Left 08/10/2012   Procedure: IRRIGATION AND DEBRIDEMENT ABSCESS;  Surgeon: Madilyn Hook, DO;  Location: [place];  Service: General;  Laterality: Left;   PARS PLANA VITRECTOMY Right 12/31/2019   Procedure: PARS PLANA VITRECTOMY WITH 25 GAUGE;  Surgeon: Jalene Mullet, MD;  Location: [place];  Service: Ophthalmology;  Laterality: Right;   PERIPHERAL VASCULAR INTERVENTION Left 05/12/2021   Procedure: PERIPHERAL VASCULAR INTERVENTION;  Surgeon: Broadus John, MD;  Location: [place] [facility];  Service: Cardiovascular;  Laterality: Left;  arm fistula   PHOTOCOAGULATION WITH LASER Right 12/31/2019   Procedure: PHOTOCOAGULATION WITH LASER;  Surgeon: Jalene Mullet, MD;  Location: [place];  Service: Ophthalmology;  Laterality: Right;   REPAIR OF COMPLEX TRACTION RETINAL DETACHMENT Right 12/31/2019   Procedure: REPAIR OF COMPLEX TRACTION RETINAL;  Surgeon: Jalene Mullet, MD;  Location: [place];  Service: Ophthalmology;  Laterality: Right;   STUMP REVISION Left 06/09/2017   Procedure: REVISION LEFT BELOW KNEE AMPUTATION;  Surgeon: Newt Minion, MD;  Location: [place];  Service: Orthopedics;  Laterality: Left;   STUMP REVISION Left 09/04/2019   Procedure: LEFT BELOW KNEE AMPUTATION REVISION;  Surgeon: Newt Minion, MD;  Location: [place];  Service: Orthopedics;  Laterality: Left;   STUMP REVISION Left 09/20/2019   Procedure: LEFT BELOW KNEE AMPUTATION REVISION;  Surgeon: Newt Minion, MD;  Location: [place];  Service: Orthopedics;  Laterality: Left;   TONSILLECTOMY  1994   Patient Active Problem List   Diagnosis Date Noted   Urgency of urination 09/12/2021   Hidradenitis  suppurativa 02/24/2021   Cough 05/26/2020   Chronic pain syndrome 01/16/2020   Hx of AKA (above knee amputation), left (HCC)    Blurry vision, right eye 10/08/2019   Tobacco use 03/01/2019   Phantom limb pain ([place]) 11/08/2018   Current moderate episode of major depressive disorder (HCC)    Hyperkalemia    Anemia of chronic disease    Bipolar affective disorder ([place])    Heart failure with preserved ejection fraction ([place]) 02/09/2017   Routine adult health maintenance 12/08/2016   End-stage renal disease on hemodialysis ([place]) 07/15/2016   OSA (obstructive sleep apnea) 05/13/2016   Morbid obesity due to excess calories ([place]) 11/27/2015   Subclinical hypothyroidism 04/25/2013   Controlled type 2 diabetes mellitus ([place]) 07/12/2012   Carpal tunnel syndrome, bilateral 01/25/2012   Diabetic polyneuropathy associated with type 2 diabetes mellitus ([place]) 07/04/2011   Anxiety and depression 06/02/2011   GERD 12/06/2007   Hyperlipidemia 08/29/2007   Hypertension associated with diabetes ([place]) 08/29/2007    REFERRING DIAG: R91.638 (ICD-10-CM) - Hx of AKA (above knee amputation), left (HCC)    THERAPY DIAG:  Other abnormalities of gait and mobility  Muscle weakness (generalized)  Unsteadiness on feet  Abnormal posture  Chronic pain of right knee  PERTINENT HISTORY: ESRD, HTN, Chronic Pain, s/p left AKA    PRECAUTIONS: Fall, on dialysis T/Th/Sat. Fistula upper left arm   SUBJECTIVE: No new complaints. Had a rough day at [facility] on Tuesday as Gerald Stabs had estimated her new prosthetic weight. Took too much fluid off and then had to put back on her along with oxygen. Now has correct weight.of prosthesis. Still having sharp stabbing pains at random times with random movements on left lower back (near buttocks area). Talked to dialysis doctors and they said its too low for kidneys as pt was thinking kidney infection. Plans to call her PCP.   PAIN:  Are you having pain? No. Reports Medicated  right now.       TODAY'S TREATMENT:  10/08/2021: CURRENT PROSTHETIC WEAR ASSESSMENT: Donning prosthesis: Modified independence Doffing prosthesis: Modified independence Prosthetic wear tolerance: all awake hours Residual limb condition: intact per pt report Comments: goes next week to get her prosthetic knee back (still with loaner knee).   STRENGTHENING  Scifit UE/LE's level 4.5  x 8 minutes with goal >/= 60 steps per minute for strengthening and activity tolerance  BALANCE/NMR  Side Stepping: in parallel bars for 3 laps each right<>left with no UE support, min guard assist with pt occasionally touching the bar for balance assistance. Cues on posture and weight shifting.  Backwards Walking: in parallel bars for 4 laps with light bil UE support, min guard assist. Cues on posture, hand advancement on bars and increased step length.    GAIT: Gait pattern: step through pattern, decreased stride length, and trunk flexed Distance walked: 40 feet x 2, plus around clinic with session Assistive device utilized: Single point cane, Walker - 2 wheeled, and Loft strand crutch, prosthesis Level of assistance:  supervision to Mod I with walker, min to mod assist with cane and crutch Comments: use of crutch, then cane with rubber quad tip for 40 feet each with cues with both of them for sequencing, device placement, step length and weight shifting. HHA on opposite side. Pt unsure which felt the best and wants to keep working with both devices.      PATIENT EDUCATION: Education details: continue with HEP Person educated: Patient Education method: Explanation Education comprehension: verbalized understanding     HOME EXERCISE PROGRAM: Access Code: [email or web address] URL: [email or web address] Date: 09/10/2021 Prepared by: Cherly Anderson  Exercises - Supine Lower Trunk Rotation  - 2 x daily - 7 x weekly - 1 sets - 4 reps - 20 sec hold - Single Knee to Chest Stretch  - 2 x daily - 7 x  weekly - 1 sets - 4 reps - 20 sec hold - Modified Thomas Stretch  - 2 x daily - 7 x weekly - 1 sets - 3 reps - 30 sec hold - Sit to Stand  - 2 x daily - 5 x weekly - 2 sets - 5 reps - Standing Hip Abduction with Counter Support  - 1 x daily - 5 x weekly - 2 sets - 10 reps - Tapping bottom of cabinet at sink  - 1 x daily - 5 x weekly - 2 sets - 10 reps - Standing Balance in Corner with Eyes Closed  - 2 x daily - 5 x weekly - 1 sets - 3 reps - 20 sec hold - Head turns side to side and up/down  - 2 x daily - 5 x weekly - 1 sets - 3 reps - 20 sec hold - Semi-Tandem Corner Balance With Eyes Open  - 2 x daily - 5 x weekly - 1 sets - 2 reps - 20 sec hold        GOALS: Goals reviewed with patient? Yes   SHORT TERM GOALS: Target date: 09/07/2021   Pt will be independent with initial HEP for strengthening, balance and aquatic program. Baseline: 09/10/21 started on initial HEP. Will plan to start aquatic program soon Goal status: Partially met   2.  Pt will decrease TUG from 40.92 sec to <35 sec for improved balance and decreased fall risk. Baseline: 09/08/21: 32.68 seconds Goal status: Met   3.  Pt will be independent with prosthetic management when receives new socket for improved function. Baseline: 09/08/21: pt has not received new socket at this time Goal status: Deferred   4.  Pt will be able to ambulate >400' on varied level surfaces with walker while initiating knee flexion in prosthesis for improved mobility. Baseline: 09/08/21: 425 feet with RW/prosthesis on indoor level/outdoor paved surfaces with pt engaging knee with swing phase Goal status: Met   5.  Berg TBD and LTG updated Baseline: Merrilee Jansky was assessed and LTG updated.  Goal status: Met       LONG TERM GOALS: Target date:  10/08/21   Pt will be independent with progressive HEP for strengthening, balance and aerobic activity to continue gains on own. Baseline: 10/01/21: has HEP, however has limited time to perform them at home due  to daughters schedule/family members staying at her house so no room to do anything.  Goal status: PARTIALLY MET   2.  Pt will ambulate 200' with cane versus loftstrand crutch on level surfaces mod I for improved household and short community distances. Baseline: 10/01/21: gait with cane has been initiated in prior sessions, not to goal level Goal status: NOT MET   3.  Pt will increase gait speed from 0.11ms to >0.661m for improved mobility. Baseline: 10/01/21: 2.07 ft/sec or  0.63 m/s. Goal status: MET   4.  Pt will increase Berg from 36 to >41/56 for improved balance and decreased fall risk. Baseline: 10/01/21: 42/56 scored today Goal status: MET   5.  Pt will report <4/10 pain with activities in right knee for improved function. Baseline: 10/01/21: pt reports this goal is met. More having lower back pain at times. Goal status: MET ASSESSMENT:   CLINICAL IMPRESSION:  Skilled session continued to focus on strengthening, balance and gait with cane vs Loft strand crutch this session. Pt needs up to mod assist with lesser devices at times, mostly min assist. No issues noted or reported in session. The pt is making steady progress and should benefit from continued PT to progress toward unmet goals.      OBJECTIVE IMPAIRMENTS Abnormal gait, decreased activity tolerance, decreased balance, decreased knowledge of use of DME, decreased mobility, decreased strength, prosthetic dependency , and pain.    ACTIVITY LIMITATIONS cleaning and community activity.    PERSONAL FACTORS Time since onset of injury/illness/exacerbation and 3+ comorbidities:  DM2, obesity, and bipolar disorder, HTN, CHF, heart murmur, peripheral neuropathy, ESRD on dialysis  are also affecting patient's functional outcome.      REHAB POTENTIAL: Good   CLINICAL DECISION MAKING: Evolving/moderate complexity   EVALUATION COMPLEXITY: Moderate         PLAN: PT FREQUENCY: 2x/week   PT DURATION: 8 weeks   PLANNED  INTERVENTIONS: Therapeutic exercises, Therapeutic activity, Neuromuscular re-education, Balance training, Gait training, Patient/Family education, Joint mobilization, Stair training, Prosthetic training, DME instructions, Aquatic Therapy, Cryotherapy, Moist heat, and Manual therapy   PLAN FOR NEXT SESSION:  . RECERT and progress note due, need to submit for more [employer] visits as well; Static and dynamic balance with decreased UE support, work on gait with cane vs forearm crutches,    KaWillow OraPTA, CLPark View183 South Sussex RoadSuBirminghamrSalvoNC [id number]/19/23, 4:48 PM

## 2021-10-09 DIAGNOSIS — N2581 Secondary hyperparathyroidism of renal origin: Secondary | ICD-10-CM | POA: Diagnosis not present

## 2021-10-09 DIAGNOSIS — N186 End stage renal disease: Secondary | ICD-10-CM | POA: Diagnosis not present

## 2021-10-09 DIAGNOSIS — Z992 Dependence on renal dialysis: Secondary | ICD-10-CM | POA: Diagnosis not present

## 2021-10-11 ENCOUNTER — Encounter: Payer: Self-pay | Admitting: Rehabilitation

## 2021-10-11 ENCOUNTER — Ambulatory Visit: Payer: Medicare HMO | Admitting: Rehabilitation

## 2021-10-11 DIAGNOSIS — R2681 Unsteadiness on feet: Secondary | ICD-10-CM

## 2021-10-11 DIAGNOSIS — M6281 Muscle weakness (generalized): Secondary | ICD-10-CM

## 2021-10-11 DIAGNOSIS — R293 Abnormal posture: Secondary | ICD-10-CM | POA: Diagnosis not present

## 2021-10-11 DIAGNOSIS — R2689 Other abnormalities of gait and mobility: Secondary | ICD-10-CM | POA: Diagnosis not present

## 2021-10-11 DIAGNOSIS — M25561 Pain in right knee: Secondary | ICD-10-CM | POA: Diagnosis not present

## 2021-10-11 DIAGNOSIS — G8929 Other chronic pain: Secondary | ICD-10-CM | POA: Diagnosis not present

## 2021-10-11 NOTE — Therapy (Signed)
OUTPATIENT PHYSICAL THERAPY TREATMENT NOTE, Progress note and RE-cert  Patient Name: [name] [name] MRN: [id number] DOB:22-Jun-1975, 46 y.o., female Today's Date: 10/11/2021  PCP: Riesa Pope, MD REFERRING PROVIDER: Axel Filler   PT End of Session - 10/11/21 1016     Visit Number 10    Number of Visits 17    Date for PT Re-Evaluation 10/08/21    Authorization Type [employer] medicare with medicaid secondary. new auth to be submitted with recert at next session after 10/08/21    Authorization Time Period 17 visits 08/09/21-10/08/21    Authorization - Visit Number 10    Authorization - Number of Visits 17    Progress Note Due on Visit 10    PT Start Time 1015    PT Stop Time 1100    PT Time Calculation (min) 45 min    Equipment Utilized During Treatment Gait belt    Activity Tolerance Patient tolerated treatment well    Behavior During Therapy [facility] for tasks assessed/performed             Past Medical History:  Diagnosis Date   Abdominal muscle pain 09/08/2016   Abnormal Pap smear of cervix 2009   Abscess    history of multiple abscesses   Acute bilateral low back pain 02/13/2017   Acute blood loss anemia    Acute on chronic renal failure ([place]) 07/12/2012   Acute renal failure ([place]) 07/12/2012   Acute respiratory failure with hypoxia ([place]) 10/21/2019   Adjustment disorder with depressed mood 03/26/2017   AKI (acute kidney injury) ([place])    Anemia 10/21/2019   Anemia of chronic disease 2002   Anginal pain (HCC)    Anxiety    Panic attacks   Bilateral lower extremity edema 05/13/2016   Bipolar disorder ([place])    Cataract    B/L cataract   Cellulitis 05/21/2014   right eye   CHF (congestive heart failure) ([place])    Chronic bronchitis ([place])    "get it q yr" (05/13/2013)   Chronic pain    Chronic pain of right knee 09/08/2016   Dehiscence of amputation stump ([place])    Depression    Edema of lower extremity    Endocarditis 2002   subacute bacterial  endocarditis.    Fall    Family history of anesthesia complication    "my mom has a hard time coming out from under"   Fibromyalgia    GERD (gastroesophageal reflux disease)    occ   Heart murmur    Herpes simplex type 1 infection 01/16/2018   History of blood transfusion    "just low blood count" (05/13/2013)   Hyperlipidemia    Hypertension    Hypothyroidism    Hypothyroidism, adult 03/21/2014   Hypoxia    Leukocytosis    Muscle spasm 12/16/2020   Necrosis ([place])    and ulceration   Necrotizing fasciitis s/p OR debridements 07/06/2012   Obesity    OSA on CPAP    does not wear all the time   Peripheral neuropathy    Pneumonia    Pyelonephritis 12/31/2020   Routine adult health maintenance 12/08/2016   Severe protein-calorie malnutrition ([place])    Status post below knee amputation, left ([place]) 04/11/2017   Type II diabetes mellitus (HCC)    Type  II   Vaginal irritation 03/14/2016   Weak pulse 07/21/2021   Wound dehiscence 09/04/2019   Past Surgical History:  Procedure Laterality Date   A/V FISTULAGRAM Left 05/12/2021  Procedure: A/V FISTULAGRAM;  Surgeon: Broadus John, MD;  Location: [place] [facility];  Service: Cardiovascular;  Laterality: Left;   ABDOMINAL AORTOGRAM W/LOWER EXTREMITY Left 03/19/2019   Procedure: ABDOMINAL AORTOGRAM W/LOWER EXTREMITY;  Surgeon: Serafina Mitchell, MD;  Location: [place] [facility];  Service: Cardiovascular;  Laterality: Left;   ABOVE KNEE LEG AMPUTATION Left 10/11/2019   wound dehisence    AIR/FLUID EXCHANGE Right 12/31/2019   Procedure: AIR/FLUID EXCHANGE;  Surgeon: Jalene Mullet, MD;  Location: [place];  Service: Ophthalmology;  Laterality: Right;   AMPUTATION Left 04/07/2017   Procedure: LEFT BELOW KNEE AMPUTATION;  Surgeon: Newt Minion, MD;  Location: [place];  Service: Orthopedics;  Laterality: Left;   AMPUTATION Left 10/11/2019   Procedure: LEFT ABOVE KNEE AMPUTATION;  Surgeon: Newt Minion, MD;  Location: [place];  Service:  Orthopedics;  Laterality: Left;   AV FISTULA PLACEMENT Left 01/05/2021   Procedure: ARTERIOVENOUS (AV) FISTULA CREATION vs. GRAFT;  Surgeon: Angelia Mould, MD;  Location: [facility] OR;  Service: Vascular;  Laterality: Left;   CATARACT EXTRACTION W/PHACO Right 02/11/2020   Procedure: CATARACT EXTRACTION PHACO AND INTRAOCULAR LENS PLACEMENT ([place]);  Surgeon: Jalene Mullet, MD;  Location: [place];  Service: Ophthalmology;  Laterality: Right;   EYE SURGERY     lazer   FISTULA SUPERFICIALIZATION Left 02/23/2021   Procedure: SUPERFICIALIZATION OF LEFT BRACHIOCEPHALIC FISTULA;  Surgeon: Angelia Mould, MD;  Location: [place];  Service: Vascular;  Laterality: Left;   GAS INSERTION Right 12/31/2019   Procedure: INSERTION OF GAS;  Surgeon: Jalene Mullet, MD;  Location: [place];  Service: Ophthalmology;  Laterality: Right;   INCISION AND DRAINAGE ABSCESS     multiple I&Ds   INCISION AND DRAINAGE ABSCESS Left 07/09/2012   Procedure: DRESSING CHANGE, THIGH WOUND;  Surgeon: Harl Bowie, MD;  Location: [place];  Service: General;  Laterality: Left;   INCISION AND DRAINAGE OF WOUND Left 07/07/2012   Procedure: IRRIGATION AND DEBRIDEMENT WOUND;  Surgeon: Harl Bowie, MD;  Location: [place];  Service: General;  Laterality: Left;   INCISION AND DRAINAGE PERIRECTAL ABSCESS Left 07/14/2012   Procedure: DEBRIDEMENT OF SKIN & SOFT TISSUE; DRESSING CHANGE UNDER ANESTHESIA;  Surgeon: Gayland Curry, MD,FACS;  Location: [place];  Service: General;  Laterality: Left;   INCISION AND DRAINAGE PERIRECTAL ABSCESS Left 07/16/2012   Procedure: I&D Left Thigh;  Surgeon: Gwenyth Ober, MD;  Location: [place];  Service: General;  Laterality: Left;   INCISION AND DRAINAGE PERIRECTAL ABSCESS N/A 01/05/2015   Procedure: IRRIGATION AND DEBRIDEMENT PERIRECTAL ABSCESS;  Surgeon: Donnie Mesa, MD;  Location: [place];  Service: General;  Laterality: N/A;   INSERTION OF DIALYSIS CATHETER Right 01/05/2021   Procedure: INSERTION  OF DIALYSIS CATHETER;  Surgeon: Angelia Mould, MD;  Location: [place];  Service: Vascular;  Laterality: Right;   IRRIGATION AND DEBRIDEMENT ABSCESS Left 07/06/2012   Procedure: IRRIGATION AND DEBRIDEMENT ABSCESS BUTTOCKS AND THIGH;  Surgeon: Shann Medal, MD;  Location: [place];  Service: General;  Laterality: Left;   IRRIGATION AND DEBRIDEMENT ABSCESS Left 08/10/2012   Procedure: IRRIGATION AND DEBRIDEMENT ABSCESS;  Surgeon: Madilyn Hook, DO;  Location: [place];  Service: General;  Laterality: Left;   PARS PLANA VITRECTOMY Right 12/31/2019   Procedure: PARS PLANA VITRECTOMY WITH 25 GAUGE;  Surgeon: Jalene Mullet, MD;  Location: [place];  Service: Ophthalmology;  Laterality: Right;   PERIPHERAL VASCULAR INTERVENTION Left 05/12/2021   Procedure: PERIPHERAL VASCULAR INTERVENTION;  Surgeon: Virl Cagey,  Carolann Littler, MD;  Location: [place] [facility];  Service: Cardiovascular;  Laterality: Left;  arm fistula   PHOTOCOAGULATION WITH LASER Right 12/31/2019   Procedure: PHOTOCOAGULATION WITH LASER;  Surgeon: Jalene Mullet, MD;  Location: [place];  Service: Ophthalmology;  Laterality: Right;   REPAIR OF COMPLEX TRACTION RETINAL DETACHMENT Right 12/31/2019   Procedure: REPAIR OF COMPLEX TRACTION RETINAL;  Surgeon: Jalene Mullet, MD;  Location: [place];  Service: Ophthalmology;  Laterality: Right;   STUMP REVISION Left 06/09/2017   Procedure: REVISION LEFT BELOW KNEE AMPUTATION;  Surgeon: Newt Minion, MD;  Location: [place];  Service: Orthopedics;  Laterality: Left;   STUMP REVISION Left 09/04/2019   Procedure: LEFT BELOW KNEE AMPUTATION REVISION;  Surgeon: Newt Minion, MD;  Location: [place];  Service: Orthopedics;  Laterality: Left;   STUMP REVISION Left 09/20/2019   Procedure: LEFT BELOW KNEE AMPUTATION REVISION;  Surgeon: Newt Minion, MD;  Location: [place];  Service: Orthopedics;  Laterality: Left;   TONSILLECTOMY  1994   Patient Active Problem List   Diagnosis Date Noted   Urgency of urination  09/12/2021   Hidradenitis suppurativa 02/24/2021   Cough 05/26/2020   Chronic pain syndrome 01/16/2020   Hx of AKA (above knee amputation), left (HCC)    Blurry vision, right eye 10/08/2019   Tobacco use 03/01/2019   Phantom limb pain ([place]) 11/08/2018   Current moderate episode of major depressive disorder (HCC)    Hyperkalemia    Anemia of chronic disease    Bipolar affective disorder ([place])    Heart failure with preserved ejection fraction ([place]) 02/09/2017   Routine adult health maintenance 12/08/2016   End-stage renal disease on hemodialysis ([place]) 07/15/2016   OSA (obstructive sleep apnea) 05/13/2016   Morbid obesity due to excess calories ([place]) 11/27/2015   Subclinical hypothyroidism 04/25/2013   Controlled type 2 diabetes mellitus ([place]) 07/12/2012   Carpal tunnel syndrome, bilateral 01/25/2012   Diabetic polyneuropathy associated with type 2 diabetes mellitus ([place]) 07/04/2011   Anxiety and depression 06/02/2011   GERD 12/06/2007   Hyperlipidemia 08/29/2007   Hypertension associated with diabetes ([place]) 08/29/2007    REFERRING DIAG: E83.151 (ICD-10-CM) - Hx of AKA (above knee amputation), left (HCC)    THERAPY DIAG:  Other abnormalities of gait and mobility  Muscle weakness (generalized)  Unsteadiness on feet  Abnormal posture  PERTINENT HISTORY: ESRD, HTN, Chronic Pain, s/p left AKA    PRECAUTIONS: Fall, on dialysis T/Th/Sat. Fistula upper left arm   SUBJECTIVE: Pt doing well is getting used to loaner knee.  Should be getting her knee back on Wednesday.   PAIN:  Are you having pain? No. Reports Medicated right now.       TODAY'S TREATMENT:  10/08/2021: CURRENT PROSTHETIC WEAR ASSESSMENT: Donning prosthesis: Modified independence Doffing prosthesis: Modified independence Prosthetic wear tolerance: all awake hours Residual limb condition: intact per pt report Comments: goes next week to get her prosthetic knee back (still with loaner knee).   STRENGTHENING     BALANCE/NMR Tandem Stance: Had pt work in // bars with staggered stance (RLE in front of LLE), holding balance x 20 secs x 2 reps without UE support, shifting weight forward to RLE to LLE x 10 reps, with cues for slow controlled movement and again without UE support.  Progressed to having pt use RUE only and tap RLE to cone x 10 reps>lightly holding with RUE x 10 more reps.     GAIT: Gait pattern: step through pattern, decreased stride length,  and trunk flexed Distance walked: 49' with forearm crutch, then 33' with quad tip cane, RW into clinic.  Assistive device utilized: Single point cane, Walker - 2 wheeled, and Loft strand crutch, prosthesis Level of assistance:  supervision to Mod I with walker, min assist with cane and crutch .   Comments: Pt needing min cues for sequencing of forearm crutch.  We started in // bars to increase confidence x 2 laps then transitioned out of bars x 115' with very good step lengths and gait speed noted.  Her confidence seems very good with forearm crutch.  When we trialed quad tip cane, she requires same assistance level however gait speed is much slower with quad tip cane. Discussed that we could order loftstrands (get order from [facility]) and then she could purchase of find cane and get quad tip.   Pt verbalized understanding.  Went back to loftstrand crutch and performed barriers starting with yard stick, smallest balance beam, black/red balance beam then stepping onto 4" step with min to mod A(esp for step) with cues for sequencing and technique.  Performed x 2 laps.  We also discussed goals and she is not walking very much at home due to carpet.  Will continue to practice both walker and crutch over simulated carpet in sessions to improve confidence and safety with task.     PATIENT EDUCATION: Education details: continue with HEP Person educated: Patient Education method: Explanation Education comprehension: verbalized understanding     HOME EXERCISE  PROGRAM: Access Code: [email or web address] URL: [email or web address] Date: 09/10/2021 Prepared by: Cherly Anderson  Exercises - Supine Lower Trunk Rotation  - 2 x daily - 7 x weekly - 1 sets - 4 reps - 20 sec hold - Single Knee to Chest Stretch  - 2 x daily - 7 x weekly - 1 sets - 4 reps - 20 sec hold - Modified Thomas Stretch  - 2 x daily - 7 x weekly - 1 sets - 3 reps - 30 sec hold - Sit to Stand  - 2 x daily - 5 x weekly - 2 sets - 5 reps - Standing Hip Abduction with Counter Support  - 1 x daily - 5 x weekly - 2 sets - 10 reps - Tapping bottom of cabinet at sink  - 1 x daily - 5 x weekly - 2 sets - 10 reps - Standing Balance in Corner with Eyes Closed  - 2 x daily - 5 x weekly - 1 sets - 3 reps - 20 sec hold - Head turns side to side and up/down  - 2 x daily - 5 x weekly - 1 sets - 3 reps - 20 sec hold - Semi-Tandem Corner Balance With Eyes Open  - 2 x daily - 5 x weekly - 1 sets - 2 reps - 20 sec hold        GOALS: Goals reviewed with patient? Yes   SHORT TERM GOALS: Target date: 11/08/2021   Pt will be independent with continued HEP for strengthening, balance and aquatic program. Baseline: Has initial HEP, Will plan to start aquatic program soon Goal status: Ongoing    2.  Pt will decrease TUG to <28 secs for improved balance and decreased fall risk. Baseline: 09/08/21: 32.68 seconds Goal status: REVISED    3.  Pt will be independent with prosthetic management when receives new socket for improved function. Baseline: Has new socket, but not her knee  Goal status: ONGOING    4.  Pt will  be able to ambulate >400' on varied level surfaces (including simulated carpet) with walker while initiating knee flexion in prosthesis for improved mobility. Baseline: 09/08/21: 425 feet with RW/prosthesis on indoor level/outdoor paved surfaces with pt engaging knee with swing phase, need to work on carpeted surfaces  Goal status:REVISED    5. Pt will improve BERG balance score to 46/56 in  order to indicate dec fall risk.   Baseline: 42/56  Goal status: REVISED   6. Pt will ambulate 150' with single loftstrand crutch vs quad tip cane at S level in order to indicate more independent household ambulation.   Baseline: 78' with crutch/cane at min A level   Goal status: NEW         LONG TERM GOALS: Target date:  12/06/21   Pt will be independent with progressive HEP for strengthening, balance and aerobic activity to continue gains on own. Baseline: 10/01/21: has HEP, however has limited time to perform them at home due to daughters schedule/family members staying at her house so no room to do anything.  Goal status: ONGOING    2.  Pt will ambulate 200' with cane versus loftstrand crutch on unlevel short community distances at S level.  Baseline: 10/01/21: gait with cane has been initiated in prior sessions, not to goal level Goal status: REVISED   3.  Pt will increase gait speed to 2.62 ft/sec w/ LRAD for improved mobility. Baseline: 10/01/21: 2.07 ft/sec or  0.63 m/s. Goal status: REVISED    4.  Pt will increase Berg to >/= 50/56 for improved balance and decreased fall risk. Baseline: 10/01/21: 42/56 scored today Goal status: REVISED    5.  Pt will negotiate 4 stairs w/ B crutches, up/down curb/ramp with single crutch vs cane at S level in order to indicate safe community mobility.   Baseline: min A at this time with RW  Goal Status: NEW  Progress Note Reporting Period 08/09/21 to 10/11/21  See note below for Objective Data and Assessment of Progress/Goals.      ASSESSMENT:   CLINICAL IMPRESSION:  Skilled session continued to focus on gait with single forearm crutch vs cane.  She feels and looks more confident and efficient with single crutch so will proceed with this.  PT to request order from Dr. Sharol Given.  Will continue to address varying surfaces with RW (to simulate carpet) and crutch in future sessions.  PT to recert for 8 more weeks.        OBJECTIVE  IMPAIRMENTS Abnormal gait, decreased activity tolerance, decreased balance, decreased knowledge of use of DME, decreased mobility, decreased strength, prosthetic dependency , and pain.    ACTIVITY LIMITATIONS cleaning and community activity.    PERSONAL FACTORS Time since onset of injury/illness/exacerbation and 3+ comorbidities:  DM2, obesity, and bipolar disorder, HTN, CHF, heart murmur, peripheral neuropathy, ESRD on dialysis  are also affecting patient's functional outcome.      REHAB POTENTIAL: Good   CLINICAL DECISION MAKING: Evolving/moderate complexity   EVALUATION COMPLEXITY: Moderate         PLAN: PT FREQUENCY: 2x/week   PT DURATION: 8 weeks   PLANNED INTERVENTIONS: Therapeutic exercises, Therapeutic activity, Neuromuscular re-education, Balance training, Gait training, Patient/Family education, Joint mobilization, Stair training, Prosthetic training, DME instructions, Aquatic Therapy, Cryotherapy, Moist heat, and Manual therapy   PLAN FOR NEXT SESSION:  .  Gait with RW over mats to simulate carpet, gait with crutch over same surfaces, outdoor, Static and dynamic balance with decreased UE support, work on gait  with cane vs forearm crutches,    Cameron Sprang, PT, MPT [facility] [address] [place], [place], [address] Phone: [phone]   Fax:  [phone] 10/11/21, 11:57 AM

## 2021-10-12 DIAGNOSIS — N2581 Secondary hyperparathyroidism of renal origin: Secondary | ICD-10-CM | POA: Diagnosis not present

## 2021-10-12 DIAGNOSIS — L299 Pruritus, unspecified: Secondary | ICD-10-CM | POA: Diagnosis not present

## 2021-10-12 DIAGNOSIS — Z992 Dependence on renal dialysis: Secondary | ICD-10-CM | POA: Diagnosis not present

## 2021-10-12 DIAGNOSIS — N186 End stage renal disease: Secondary | ICD-10-CM | POA: Diagnosis not present

## 2021-10-13 ENCOUNTER — Encounter: Payer: Medicare HMO | Admitting: Rehabilitation

## 2021-10-14 DIAGNOSIS — L299 Pruritus, unspecified: Secondary | ICD-10-CM | POA: Diagnosis not present

## 2021-10-14 DIAGNOSIS — N2581 Secondary hyperparathyroidism of renal origin: Secondary | ICD-10-CM | POA: Diagnosis not present

## 2021-10-14 DIAGNOSIS — N186 End stage renal disease: Secondary | ICD-10-CM | POA: Diagnosis not present

## 2021-10-14 DIAGNOSIS — Z992 Dependence on renal dialysis: Secondary | ICD-10-CM | POA: Diagnosis not present

## 2021-10-15 ENCOUNTER — Encounter: Payer: Self-pay | Admitting: Physical Therapy

## 2021-10-15 ENCOUNTER — Encounter: Payer: Medicare HMO | Admitting: Physical Therapy

## 2021-10-15 ENCOUNTER — Ambulatory Visit: Payer: Medicare HMO | Admitting: Physical Therapy

## 2021-10-15 DIAGNOSIS — R2681 Unsteadiness on feet: Secondary | ICD-10-CM | POA: Diagnosis not present

## 2021-10-15 DIAGNOSIS — G8929 Other chronic pain: Secondary | ICD-10-CM | POA: Diagnosis not present

## 2021-10-15 DIAGNOSIS — M6281 Muscle weakness (generalized): Secondary | ICD-10-CM

## 2021-10-15 DIAGNOSIS — R2689 Other abnormalities of gait and mobility: Secondary | ICD-10-CM | POA: Diagnosis not present

## 2021-10-15 DIAGNOSIS — R293 Abnormal posture: Secondary | ICD-10-CM | POA: Diagnosis not present

## 2021-10-15 DIAGNOSIS — M25561 Pain in right knee: Secondary | ICD-10-CM | POA: Diagnosis not present

## 2021-10-15 NOTE — Therapy (Signed)
OUTPATIENT PHYSICAL THERAPY TREATMENT NOTE  Patient Name: [name] [name] MRN: [id number] DOB:1975-09-27, 46 y.o., female Today's Date: 10/15/2021  PCP: Riesa Pope, MD REFERRING PROVIDER: Riesa Pope, *   PT End of Session - 10/15/21 1024     Visit Number 11    Number of Visits 17    Date for PT Re-Evaluation 10/08/21    Authorization Type [employer] medicare with medicaid secondary. new auth to be submitted with recert at next session after 10/08/21    Authorization Time Period 17 visits 08/09/21-10/08/21;    Authorization - Visit Number 2    Progress Note Due on Visit 20    PT Start Time 6789    PT Stop Time 1100    PT Time Calculation (min) 42 min    Equipment Utilized During Treatment Gait belt    Activity Tolerance Patient tolerated treatment well    Behavior During Therapy WFL for tasks assessed/performed             Past Medical History:  Diagnosis Date   Abdominal muscle pain 09/08/2016   Abnormal Pap smear of cervix 2009   Abscess    history of multiple abscesses   Acute bilateral low back pain 02/13/2017   Acute blood loss anemia    Acute on chronic renal failure ([place]) 07/12/2012   Acute renal failure ([place]) 07/12/2012   Acute respiratory failure with hypoxia ([place]) 10/21/2019   Adjustment disorder with depressed mood 03/26/2017   AKI (acute kidney injury) ([place])    Anemia 10/21/2019   Anemia of chronic disease 2002   Anginal pain (HCC)    Anxiety    Panic attacks   Bilateral lower extremity edema 05/13/2016   Bipolar disorder ([place])    Cataract    B/L cataract   Cellulitis 05/21/2014   right eye   CHF (congestive heart failure) ([place])    Chronic bronchitis ([place])    "get it q yr" (05/13/2013)   Chronic pain    Chronic pain of right knee 09/08/2016   Dehiscence of amputation stump ([place])    Depression    Edema of lower extremity    Endocarditis 2002   subacute bacterial endocarditis.    Fall    Family history of anesthesia  complication    "my mom has a hard time coming out from under"   Fibromyalgia    GERD (gastroesophageal reflux disease)    occ   Heart murmur    Herpes simplex type 1 infection 01/16/2018   History of blood transfusion    "just low blood count" (05/13/2013)   Hyperlipidemia    Hypertension    Hypothyroidism    Hypothyroidism, adult 03/21/2014   Hypoxia    Leukocytosis    Muscle spasm 12/16/2020   Necrosis ([place])    and ulceration   Necrotizing fasciitis s/p OR debridements 07/06/2012   Obesity    OSA on CPAP    does not wear all the time   Peripheral neuropathy    Pneumonia    Pyelonephritis 12/31/2020   Routine adult health maintenance 12/08/2016   Severe protein-calorie malnutrition ([place])    Status post below knee amputation, left ([place]) 04/11/2017   Type II diabetes mellitus ([place])    Type  II   Vaginal irritation 03/14/2016   Weak pulse 07/21/2021   Wound dehiscence 09/04/2019   Past Surgical History:  Procedure Laterality Date   A/V FISTULAGRAM Left 05/12/2021   Procedure: A/V FISTULAGRAM;  Surgeon: Broadus John, MD;  Location: [facility];  Service: Cardiovascular;  Laterality: Left;   ABDOMINAL AORTOGRAM W/LOWER EXTREMITY Left 03/19/2019   Procedure: ABDOMINAL AORTOGRAM W/LOWER EXTREMITY;  Surgeon: Serafina Mitchell, MD;  Location: [facility];  Service: Cardiovascular;  Laterality: Left;   ABOVE KNEE LEG AMPUTATION Left 10/11/2019   wound dehisence    AIR/FLUID EXCHANGE Right 12/31/2019   Procedure: AIR/FLUID EXCHANGE;  Surgeon: Jalene Mullet, MD;  Location: [facility];  Service: Ophthalmology;  Laterality: Right;   AMPUTATION Left 04/07/2017   Procedure: LEFT BELOW KNEE AMPUTATION;  Surgeon: Newt Minion, MD;  Location: [facility];  Service: Orthopedics;  Laterality: Left;   AMPUTATION Left 10/11/2019   Procedure: LEFT ABOVE KNEE AMPUTATION;  Surgeon: Newt Minion, MD;  Location: [facility];  Service: Orthopedics;  Laterality: Left;   AV FISTULA PLACEMENT Left  01/05/2021   Procedure: ARTERIOVENOUS (AV) FISTULA CREATION vs. GRAFT;  Surgeon: Angelia Mould, MD;  Location: [facility];  Service: Vascular;  Laterality: Left;   CATARACT EXTRACTION W/PHACO Right 02/11/2020   Procedure: CATARACT EXTRACTION PHACO AND INTRAOCULAR LENS PLACEMENT ([place]);  Surgeon: Jalene Mullet, MD;  Location: [facility];  Service: Ophthalmology;  Laterality: Right;   EYE SURGERY     lazer   FISTULA SUPERFICIALIZATION Left 02/23/2021   Procedure: SUPERFICIALIZATION OF LEFT BRACHIOCEPHALIC FISTULA;  Surgeon: Angelia Mould, MD;  Location: [facility];  Service: Vascular;  Laterality: Left;   GAS INSERTION Right 12/31/2019   Procedure: INSERTION OF GAS;  Surgeon: Jalene Mullet, MD;  Location: [facility];  Service: Ophthalmology;  Laterality: Right;   INCISION AND DRAINAGE ABSCESS     multiple I&Ds   INCISION AND DRAINAGE ABSCESS Left 07/09/2012   Procedure: DRESSING CHANGE, THIGH WOUND;  Surgeon: Harl Bowie, MD;  Location: [facility];  Service: General;  Laterality: Left;   INCISION AND DRAINAGE OF WOUND Left 07/07/2012   Procedure: IRRIGATION AND DEBRIDEMENT WOUND;  Surgeon: Harl Bowie, MD;  Location: [facility];  Service: General;  Laterality: Left;   INCISION AND DRAINAGE PERIRECTAL ABSCESS Left 07/14/2012   Procedure: DEBRIDEMENT OF SKIN & SOFT TISSUE; DRESSING CHANGE UNDER ANESTHESIA;  Surgeon: Gayland Curry, MD,FACS;  Location: [facility];  Service: General;  Laterality: Left;   INCISION AND DRAINAGE PERIRECTAL ABSCESS Left 07/16/2012   Procedure: I&D Left Thigh;  Surgeon: Gwenyth Ober, MD;  Location: [facility];  Service: General;  Laterality: Left;   INCISION AND DRAINAGE PERIRECTAL ABSCESS N/A 01/05/2015   Procedure: IRRIGATION AND DEBRIDEMENT PERIRECTAL ABSCESS;  Surgeon: Donnie Mesa, MD;  Location: [facility];  Service: General;  Laterality: N/A;   INSERTION OF DIALYSIS CATHETER Right 01/05/2021   Procedure: INSERTION OF DIALYSIS CATHETER;  Surgeon: Angelia Mould, MD;   Location: [facility];  Service: Vascular;  Laterality: Right;   IRRIGATION AND DEBRIDEMENT ABSCESS Left 07/06/2012   Procedure: IRRIGATION AND DEBRIDEMENT ABSCESS BUTTOCKS AND THIGH;  Surgeon: Shann Medal, MD;  Location: [facility];  Service: General;  Laterality: Left;   IRRIGATION AND DEBRIDEMENT ABSCESS Left 08/10/2012   Procedure: IRRIGATION AND DEBRIDEMENT ABSCESS;  Surgeon: Madilyn Hook, DO;  Location: [facility];  Service: General;  Laterality: Left;   PARS PLANA VITRECTOMY Right 12/31/2019   Procedure: PARS PLANA VITRECTOMY WITH 25 GAUGE;  Surgeon: Jalene Mullet, MD;  Location: [facility];  Service: Ophthalmology;  Laterality: Right;   PERIPHERAL VASCULAR INTERVENTION Left 05/12/2021   Procedure: PERIPHERAL VASCULAR INTERVENTION;  Surgeon: Broadus John, MD;  Location: [facility];  Service: Cardiovascular;  Laterality: Left;  arm fistula   PHOTOCOAGULATION WITH LASER Right 12/31/2019   Procedure: PHOTOCOAGULATION WITH LASER;  Surgeon: Jalene Mullet, MD;  Location: [facility];  Service: Ophthalmology;  Laterality: Right;   REPAIR OF COMPLEX TRACTION RETINAL DETACHMENT Right 12/31/2019   Procedure: REPAIR OF COMPLEX TRACTION RETINAL;  Surgeon: Jalene Mullet, MD;  Location: [facility];  Service: Ophthalmology;  Laterality: Right;   STUMP REVISION Left 06/09/2017   Procedure: REVISION LEFT BELOW KNEE AMPUTATION;  Surgeon: Newt Minion, MD;  Location: [facility];  Service: Orthopedics;  Laterality: Left;   STUMP REVISION Left 09/04/2019   Procedure: LEFT BELOW KNEE AMPUTATION REVISION;  Surgeon: Newt Minion, MD;  Location: [facility];  Service: Orthopedics;  Laterality: Left;   STUMP REVISION Left 09/20/2019   Procedure: LEFT BELOW KNEE AMPUTATION REVISION;  Surgeon: Newt Minion, MD;  Location: [facility];  Service: Orthopedics;  Laterality: Left;   TONSILLECTOMY  1994   Patient Active Problem List   Diagnosis Date Noted   Urgency of urination 09/12/2021   Hidradenitis suppurativa 02/24/2021   Cough  05/26/2020   Chronic pain syndrome 01/16/2020   Hx of AKA (above knee amputation), left (HCC)    Blurry vision, right eye 10/08/2019   Tobacco use 03/01/2019   Phantom limb pain ([place]) 11/08/2018   Current moderate episode of major depressive disorder (HCC)    Hyperkalemia    Anemia of chronic disease    Bipolar affective disorder ([place])    Heart failure with preserved ejection fraction ([place]) 02/09/2017   Routine adult health maintenance 12/08/2016   End-stage renal disease on hemodialysis ([place]) 07/15/2016   OSA (obstructive sleep apnea) 05/13/2016   Morbid obesity due to excess calories ([place]) 11/27/2015   Subclinical hypothyroidism 04/25/2013   Controlled type 2 diabetes mellitus ([place]) 07/12/2012   Carpal tunnel syndrome, bilateral 01/25/2012   Diabetic polyneuropathy associated with type 2 diabetes mellitus ([place]) 07/04/2011   Anxiety and depression 06/02/2011   GERD 12/06/2007   Hyperlipidemia 08/29/2007   Hypertension associated with diabetes ([place]) 08/29/2007    REFERRING DIAG: B14.782 (ICD-10-CM) - Hx of AKA (above knee amputation), left (HCC)    THERAPY DIAG:  Other abnormalities of gait and mobility  Muscle weakness (generalized)  Unsteadiness on feet  Abnormal posture  Chronic pain of right knee  PERTINENT HISTORY: ESRD, HTN, Chronic Pain, s/p left AKA    PRECAUTIONS: Fall, on dialysis T/Th/Sat. Fistula upper left arm   SUBJECTIVE: Has her prosthetic knee now and it's going great, working and charging like it's supposed too. Sleepy today, feels its due to her anxiety med's she took last night. No falls.  PAIN:  Are you having pain? No. Reports Medicated right now.       TODAY'S TREATMENT:  10/15/2021: CURRENT PROSTHETIC WEAR ASSESSMENT: Donning prosthesis: Modified independence Doffing prosthesis: Modified independence Prosthetic wear tolerance: all awake hours Residual limb condition: intact per pt report Comments: has her microprocessor knee back with no  issues.    STRENGTHENING  Scifit UE/LE's level 3.5  x 8 minutes with goal >/= 90 steps per minute for strengthening and activity tolerance.   BALANCE/NMR In parallel bars working on activities to engage hydraulics of knee:  - wooden 4 inch box with wedges on either side- stepping onto box with sound side and prosthesis on wedges to "ride" hydraulics down wedge, block practice with UE support on bars,cues on weight shifting onto and forward on prosthesis to further engage hydraulics. Min guard assist for safety - with  aerobic step working on using prosthesis to step down "riding hydraulics:" in block practice.cues for technique and weight shifting.   Standing next to mat table:  - mini squats with emphasis on increased weight bearing on prosthesis to engage knee hydraulics x 10 reps with no UE support. Min guard assist for safety. Pt going lower with each rep and increasing prosthetic use/weight bearing as reps progressed and trust built up in prosthesis.    GAIT: Gait pattern: step through pattern, decreased stride length, and trunk flexed Distance walked: around clinic with RW Assistive device utilized: Walker - 2 wheeled and Loft strand crutch, prosthesis Level of assistance:  supervision to Mod I with walker .   Comments: focused on prosthetic advancement/swing phase between each activity for carryover of engaging the hydraulics     PATIENT EDUCATION: Education details: continue with HEP Person educated: Patient Education method: Explanation Education comprehension: verbalized understanding     HOME EXERCISE PROGRAM: Access Code: [email or web address] URL: [email or web address] Date: 09/10/2021 Prepared by: Cherly Anderson  Exercises - Supine Lower Trunk Rotation  - 2 x daily - 7 x weekly - 1 sets - 4 reps - 20 sec hold - Single Knee to Chest Stretch  - 2 x daily - 7 x weekly - 1 sets - 4 reps - 20 sec hold - Modified Thomas Stretch  - 2 x daily - 7 x weekly - 1 sets - 3 reps  - 30 sec hold - Sit to Stand  - 2 x daily - 5 x weekly - 2 sets - 5 reps - Standing Hip Abduction with Counter Support  - 1 x daily - 5 x weekly - 2 sets - 10 reps - Tapping bottom of cabinet at sink  - 1 x daily - 5 x weekly - 2 sets - 10 reps - Standing Balance in Corner with Eyes Closed  - 2 x daily - 5 x weekly - 1 sets - 3 reps - 20 sec hold - Head turns side to side and up/down  - 2 x daily - 5 x weekly - 1 sets - 3 reps - 20 sec hold - Semi-Tandem Corner Balance With Eyes Open  - 2 x daily - 5 x weekly - 1 sets - 2 reps - 20 sec hold        GOALS: Goals reviewed with patient? Yes   SHORT TERM GOALS: Target date: 11/08/2021   Pt will be independent with continued HEP for strengthening, balance and aquatic program. Baseline: Has initial HEP, Will plan to start aquatic program soon Goal status: Ongoing    2.  Pt will decrease TUG to <28 secs for improved balance and decreased fall risk. Baseline: 09/08/21: 32.68 seconds Goal status: REVISED    3.  Pt will be independent with prosthetic management when receives new socket for improved function. Baseline: Has new socket, but not her knee  Goal status: ONGOING    4.  Pt will be able to ambulate >400' on varied level surfaces (including simulated carpet) with walker while initiating knee flexion in prosthesis for improved mobility. Baseline: 09/08/21: 425 feet with RW/prosthesis on indoor level/outdoor paved surfaces with pt engaging knee with swing phase, need to work on carpeted surfaces  Goal status:REVISED    5. Pt will improve BERG balance score to 46/56 in order to indicate dec fall risk.   Baseline: 42/56  Goal status: REVISED   6. Pt will ambulate 150' with single loftstrand crutch vs quad tip cane at S  level in order to indicate more independent household ambulation.   Baseline: 9' with crutch/cane at min A level   Goal status: NEW         LONG TERM GOALS: Target date:  12/06/21   Pt will be independent with  progressive HEP for strengthening, balance and aerobic activity to continue gains on own. Baseline: 10/01/21: has HEP, however has limited time to perform them at home due to daughters schedule/family members staying at her house so no room to do anything.  Goal status: ONGOING    2.  Pt will ambulate 200' with cane versus loftstrand crutch on unlevel short community distances at S level.  Baseline: 10/01/21: gait with cane has been initiated in prior sessions, not to goal level Goal status: REVISED   3.  Pt will increase gait speed to 2.62 ft/sec w/ LRAD for improved mobility. Baseline: 10/01/21: 2.07 ft/sec or  0.63 m/s. Goal status: REVISED    4.  Pt will increase Berg to >/= 50/56 for improved balance and decreased fall risk. Baseline: 10/01/21: 42/56 scored today Goal status: REVISED    5.  Pt will negotiate 4 stairs w/ B crutches, up/down curb/ramp with single crutch vs cane at S level in order to indicate safe community mobility.   Baseline: min A at this time with RW  Goal Status: NEW    ASSESSMENT:   CLINICAL IMPRESSION:  Skilled session continued to focus on strengthening/activity tolerance and began to focus on training to engage prosthetic knee hydraulics now that pt has her own prosthetic knee back that in functioning as able.  No issues noted or reported in session. Pt was able to engage hydraulics some with session, will benefit from additional work on this. The pt is making progress and should benefit from continued PT to progress toward unmet goals.      OBJECTIVE IMPAIRMENTS Abnormal gait, decreased activity tolerance, decreased balance, decreased knowledge of use of DME, decreased mobility, decreased strength, prosthetic dependency , and pain.    ACTIVITY LIMITATIONS cleaning and community activity.    PERSONAL FACTORS Time since onset of injury/illness/exacerbation and 3+ comorbidities:  DM2, obesity, and bipolar disorder, HTN, CHF, heart murmur, peripheral  neuropathy, ESRD on dialysis  are also affecting patient's functional outcome.      REHAB POTENTIAL: Good   CLINICAL DECISION MAKING: Evolving/moderate complexity   EVALUATION COMPLEXITY: Moderate         PLAN: PT FREQUENCY: 2x/week   PT DURATION: 8 weeks   PLANNED INTERVENTIONS: Therapeutic exercises, Therapeutic activity, Neuromuscular re-education, Balance training, Gait training, Patient/Family education, Joint mobilization, Stair training, Prosthetic training, DME instructions, Aquatic Therapy, Cryotherapy, Moist heat, and Manual therapy   PLAN FOR NEXT SESSION:  .  Gait with RW over mats to simulate carpet, gait with crutch over same surfaces, outdoor, Static and dynamic balance with decreased UE support, work on gait with cane vs forearm crutches, continue to work on activities that promote use of prosthetic knee hydraulics    Willow Ora, PTA, [place] [address] [place], [place] [address] [phone] 10/15/21, 2:37 PM

## 2021-10-16 DIAGNOSIS — N186 End stage renal disease: Secondary | ICD-10-CM | POA: Diagnosis not present

## 2021-10-16 DIAGNOSIS — Z992 Dependence on renal dialysis: Secondary | ICD-10-CM | POA: Diagnosis not present

## 2021-10-16 DIAGNOSIS — N2581 Secondary hyperparathyroidism of renal origin: Secondary | ICD-10-CM | POA: Diagnosis not present

## 2021-10-16 DIAGNOSIS — L299 Pruritus, unspecified: Secondary | ICD-10-CM | POA: Diagnosis not present

## 2021-10-19 DIAGNOSIS — N186 End stage renal disease: Secondary | ICD-10-CM | POA: Diagnosis not present

## 2021-10-19 DIAGNOSIS — N2581 Secondary hyperparathyroidism of renal origin: Secondary | ICD-10-CM | POA: Diagnosis not present

## 2021-10-19 DIAGNOSIS — L299 Pruritus, unspecified: Secondary | ICD-10-CM | POA: Diagnosis not present

## 2021-10-19 DIAGNOSIS — Z992 Dependence on renal dialysis: Secondary | ICD-10-CM | POA: Diagnosis not present

## 2021-10-20 ENCOUNTER — Ambulatory Visit: Payer: Medicare HMO | Admitting: Physical Therapy

## 2021-10-20 ENCOUNTER — Encounter: Payer: Self-pay | Admitting: Physical Therapy

## 2021-10-20 ENCOUNTER — Other Ambulatory Visit: Payer: Self-pay | Admitting: Student

## 2021-10-20 DIAGNOSIS — E1122 Type 2 diabetes mellitus with diabetic chronic kidney disease: Secondary | ICD-10-CM | POA: Diagnosis not present

## 2021-10-20 DIAGNOSIS — M6281 Muscle weakness (generalized): Secondary | ICD-10-CM

## 2021-10-20 DIAGNOSIS — R293 Abnormal posture: Secondary | ICD-10-CM | POA: Diagnosis not present

## 2021-10-20 DIAGNOSIS — R2681 Unsteadiness on feet: Secondary | ICD-10-CM | POA: Diagnosis not present

## 2021-10-20 DIAGNOSIS — G8929 Other chronic pain: Secondary | ICD-10-CM | POA: Diagnosis not present

## 2021-10-20 DIAGNOSIS — R2689 Other abnormalities of gait and mobility: Secondary | ICD-10-CM

## 2021-10-20 DIAGNOSIS — Z992 Dependence on renal dialysis: Secondary | ICD-10-CM | POA: Diagnosis not present

## 2021-10-20 DIAGNOSIS — N186 End stage renal disease: Secondary | ICD-10-CM | POA: Diagnosis not present

## 2021-10-20 DIAGNOSIS — Z89512 Acquired absence of left leg below knee: Secondary | ICD-10-CM

## 2021-10-20 DIAGNOSIS — M25561 Pain in right knee: Secondary | ICD-10-CM | POA: Diagnosis not present

## 2021-10-20 NOTE — Telephone Encounter (Signed)
Last appt 09/15/2021.  No [employer]ures seen

## 2021-10-20 NOTE — Therapy (Signed)
OUTPATIENT PHYSICAL THERAPY TREATMENT NOTE  Patient Name: [name] [name] MRN: [id number] DOB:Oct 02, 1975, 46 y.o., female Today's Date: 10/20/2021  PCP: Riesa Pope, MD REFERRING PROVIDER: Riesa Pope, *   PT End of Session - 10/20/21 1536     Visit Number 12    Number of Visits 17    Date for PT Re-Evaluation 10/08/21    Authorization Type [employer] medicare with medicaid secondary. new auth to be submitted with recert at next session after 10/08/21    Authorization Time Period 17 visits 08/09/21-10/08/21;    Authorization - Visit Number 3    Progress Note Due on Visit 20    PT Start Time 4782    PT Stop Time 1615    PT Time Calculation (min) 42 min    Equipment Utilized During Treatment Gait belt    Activity Tolerance Patient tolerated treatment well;No increased pain    Behavior During Therapy [facility] for tasks assessed/performed             Past Medical History:  Diagnosis Date   Abdominal muscle pain 09/08/2016   Abnormal Pap smear of cervix 2009   Abscess    history of multiple abscesses   Acute bilateral low back pain 02/13/2017   Acute blood loss anemia    Acute on chronic renal failure ([place]) 07/12/2012   Acute renal failure ([place]) 07/12/2012   Acute respiratory failure with hypoxia ([place]) 10/21/2019   Adjustment disorder with depressed mood 03/26/2017   AKI (acute kidney injury) ([place])    Anemia 10/21/2019   Anemia of chronic disease 2002   Anginal pain ([place])    Anxiety    Panic attacks   Bilateral lower extremity edema 05/13/2016   Bipolar disorder ([place])    Cataract    B/L cataract   Cellulitis 05/21/2014   right eye   CHF (congestive heart failure) ([place])    Chronic bronchitis ([place])    "get it q yr" (05/13/2013)   Chronic pain    Chronic pain of right knee 09/08/2016   Dehiscence of amputation stump ([place])    Depression    Edema of lower extremity    Endocarditis 2002   subacute bacterial endocarditis.    Fall    Family history of  anesthesia complication    "my mom has a hard time coming out from under"   Fibromyalgia    GERD (gastroesophageal reflux disease)    occ   Heart murmur    Herpes simplex type 1 infection 01/16/2018   History of blood transfusion    "just low blood count" (05/13/2013)   Hyperlipidemia    Hypertension    Hypothyroidism    Hypothyroidism, adult 03/21/2014   Hypoxia    Leukocytosis    Muscle spasm 12/16/2020   Necrosis ([place])    and ulceration   Necrotizing fasciitis s/p OR debridements 07/06/2012   Obesity    OSA on CPAP    does not wear all the time   Peripheral neuropathy    Pneumonia    Pyelonephritis 12/31/2020   Routine adult health maintenance 12/08/2016   Severe protein-calorie malnutrition ([place])    Status post below knee amputation, left ([place]) 04/11/2017   Type II diabetes mellitus ([place])    Type  II   Vaginal irritation 03/14/2016   Weak pulse 07/21/2021   Wound dehiscence 09/04/2019   Past Surgical History:  Procedure Laterality Date   A/V FISTULAGRAM Left 05/12/2021   Procedure: A/V FISTULAGRAM;  Surgeon: Broadus John, MD;  Location:  [place] [facility];  Service: Cardiovascular;  Laterality: Left;   ABDOMINAL AORTOGRAM W/LOWER EXTREMITY Left 03/19/2019   Procedure: ABDOMINAL AORTOGRAM W/LOWER EXTREMITY;  Surgeon: Serafina Mitchell, MD;  Location: [place] [facility];  Service: Cardiovascular;  Laterality: Left;   ABOVE KNEE LEG AMPUTATION Left 10/11/2019   wound dehisence    AIR/FLUID EXCHANGE Right 12/31/2019   Procedure: AIR/FLUID EXCHANGE;  Surgeon: Jalene Mullet, MD;  Location: [place];  Service: Ophthalmology;  Laterality: Right;   AMPUTATION Left 04/07/2017   Procedure: LEFT BELOW KNEE AMPUTATION;  Surgeon: Newt Minion, MD;  Location: [place];  Service: Orthopedics;  Laterality: Left;   AMPUTATION Left 10/11/2019   Procedure: LEFT ABOVE KNEE AMPUTATION;  Surgeon: Newt Minion, MD;  Location: [place];  Service: Orthopedics;  Laterality: Left;   AV FISTULA  PLACEMENT Left 01/05/2021   Procedure: ARTERIOVENOUS (AV) FISTULA CREATION vs. GRAFT;  Surgeon: Angelia Mould, MD;  Location: [facility] OR;  Service: Vascular;  Laterality: Left;   CATARACT EXTRACTION W/PHACO Right 02/11/2020   Procedure: CATARACT EXTRACTION PHACO AND INTRAOCULAR LENS PLACEMENT ([place]);  Surgeon: Jalene Mullet, MD;  Location: [place];  Service: Ophthalmology;  Laterality: Right;   EYE SURGERY     lazer   FISTULA SUPERFICIALIZATION Left 02/23/2021   Procedure: SUPERFICIALIZATION OF LEFT BRACHIOCEPHALIC FISTULA;  Surgeon: Angelia Mould, MD;  Location: [place];  Service: Vascular;  Laterality: Left;   GAS INSERTION Right 12/31/2019   Procedure: INSERTION OF GAS;  Surgeon: Jalene Mullet, MD;  Location: [place];  Service: Ophthalmology;  Laterality: Right;   INCISION AND DRAINAGE ABSCESS     multiple I&Ds   INCISION AND DRAINAGE ABSCESS Left 07/09/2012   Procedure: DRESSING CHANGE, THIGH WOUND;  Surgeon: Harl Bowie, MD;  Location: [place];  Service: General;  Laterality: Left;   INCISION AND DRAINAGE OF WOUND Left 07/07/2012   Procedure: IRRIGATION AND DEBRIDEMENT WOUND;  Surgeon: Harl Bowie, MD;  Location: [place];  Service: General;  Laterality: Left;   INCISION AND DRAINAGE PERIRECTAL ABSCESS Left 07/14/2012   Procedure: DEBRIDEMENT OF SKIN & SOFT TISSUE; DRESSING CHANGE UNDER ANESTHESIA;  Surgeon: Gayland Curry, MD,FACS;  Location: [place];  Service: General;  Laterality: Left;   INCISION AND DRAINAGE PERIRECTAL ABSCESS Left 07/16/2012   Procedure: I&D Left Thigh;  Surgeon: Gwenyth Ober, MD;  Location: [place];  Service: General;  Laterality: Left;   INCISION AND DRAINAGE PERIRECTAL ABSCESS N/A 01/05/2015   Procedure: IRRIGATION AND DEBRIDEMENT PERIRECTAL ABSCESS;  Surgeon: Donnie Mesa, MD;  Location: [place];  Service: General;  Laterality: N/A;   INSERTION OF DIALYSIS CATHETER Right 01/05/2021   Procedure: INSERTION OF DIALYSIS CATHETER;  Surgeon: Angelia Mould, MD;  Location: [place];  Service: Vascular;  Laterality: Right;   IRRIGATION AND DEBRIDEMENT ABSCESS Left 07/06/2012   Procedure: IRRIGATION AND DEBRIDEMENT ABSCESS BUTTOCKS AND THIGH;  Surgeon: Shann Medal, MD;  Location: [place];  Service: General;  Laterality: Left;   IRRIGATION AND DEBRIDEMENT ABSCESS Left 08/10/2012   Procedure: IRRIGATION AND DEBRIDEMENT ABSCESS;  Surgeon: Madilyn Hook, DO;  Location: [place];  Service: General;  Laterality: Left;   PARS PLANA VITRECTOMY Right 12/31/2019   Procedure: PARS PLANA VITRECTOMY WITH 25 GAUGE;  Surgeon: Jalene Mullet, MD;  Location: [place];  Service: Ophthalmology;  Laterality: Right;   PERIPHERAL VASCULAR INTERVENTION Left 05/12/2021   Procedure: PERIPHERAL VASCULAR INTERVENTION;  Surgeon: Broadus John, MD;  Location: [place] [facility];  Service:  Cardiovascular;  Laterality: Left;  arm fistula   PHOTOCOAGULATION WITH LASER Right 12/31/2019   Procedure: PHOTOCOAGULATION WITH LASER;  Surgeon: Jalene Mullet, MD;  Location: [place];  Service: Ophthalmology;  Laterality: Right;   REPAIR OF COMPLEX TRACTION RETINAL DETACHMENT Right 12/31/2019   Procedure: REPAIR OF COMPLEX TRACTION RETINAL;  Surgeon: Jalene Mullet, MD;  Location: [place];  Service: Ophthalmology;  Laterality: Right;   STUMP REVISION Left 06/09/2017   Procedure: REVISION LEFT BELOW KNEE AMPUTATION;  Surgeon: Newt Minion, MD;  Location: [place];  Service: Orthopedics;  Laterality: Left;   STUMP REVISION Left 09/04/2019   Procedure: LEFT BELOW KNEE AMPUTATION REVISION;  Surgeon: Newt Minion, MD;  Location: [place];  Service: Orthopedics;  Laterality: Left;   STUMP REVISION Left 09/20/2019   Procedure: LEFT BELOW KNEE AMPUTATION REVISION;  Surgeon: Newt Minion, MD;  Location: [place];  Service: Orthopedics;  Laterality: Left;   TONSILLECTOMY  1994   Patient Active Problem List   Diagnosis Date Noted   Urgency of urination 09/12/2021   Hidradenitis suppurativa  02/24/2021   Cough 05/26/2020   Chronic pain syndrome 01/16/2020   Hx of AKA (above knee amputation), left (HCC)    Blurry vision, right eye 10/08/2019   Tobacco use 03/01/2019   Phantom limb pain ([place]) 11/08/2018   Current moderate episode of major depressive disorder (HCC)    Hyperkalemia    Anemia of chronic disease    Bipolar affective disorder ([place])    Heart failure with preserved ejection fraction ([place]) 02/09/2017   Routine adult health maintenance 12/08/2016   End-stage renal disease on hemodialysis ([place]) 07/15/2016   OSA (obstructive sleep apnea) 05/13/2016   Morbid obesity due to excess calories ([place]) 11/27/2015   Subclinical hypothyroidism 04/25/2013   Controlled type 2 diabetes mellitus ([place]) 07/12/2012   Carpal tunnel syndrome, bilateral 01/25/2012   Diabetic polyneuropathy associated with type 2 diabetes mellitus ([place]) 07/04/2011   Anxiety and depression 06/02/2011   GERD 12/06/2007   Hyperlipidemia 08/29/2007   Hypertension associated with diabetes ([place]) 08/29/2007    REFERRING DIAG: T02.409 (ICD-10-CM) - Hx of AKA (above knee amputation), left (HCC)    THERAPY DIAG:  Other abnormalities of gait and mobility  Muscle weakness (generalized)  Unsteadiness on feet  Abnormal posture  PERTINENT HISTORY: ESRD, HTN, Chronic Pain, s/p left AKA    PRECAUTIONS: Fall, on dialysis T/Th/Sat. Fistula upper left arm   SUBJECTIVE: No new complaints. No falls or pain to report. No issues after last session.   PAIN:  Are you having pain? No. Reports Medicated right now.       TODAY'S TREATMENT:  10/15/2021: CURRENT PROSTHETIC WEAR ASSESSMENT: Donning prosthesis: Modified independence Doffing prosthesis: Modified independence Prosthetic wear tolerance: all awake hours Residual limb condition: intact per pt report Comments: has her microprocessor knee back with no issues.    STRENGTHENING  Scifit UE/LE's level 4.0  x 8 minutes with goal >/= 90 steps per minute for  strengthening and activity tolerance.   BALANCE/NMR In parallel bars working on activities to engage hydraulics of knee:  - with aerobic step working on using prosthesis to step down "riding hydraulics:" in block practice.cues for technique and weight shifting.     On stairs with bil rails: block practice "riding" hydraulics down with cues on sequencing, weight shifting and to advance hands on rails. Guarding of prosthetic knee for when weight not shifting and knee goes too quickly.    GAIT: Gait pattern: step through pattern, decreased  stride length, and trunk flexed Distance walked: around clinic with RW; 115 x 1 with forearm crutch Assistive device utilized: Walker - 2 wheeled and Loft strand crutch, prosthesis Level of assistance:  supervision to Mod I with walker; min guard to min assist for balance with gait    Comments: cues with gait with crutch for posture, decreased prosthetic step length. Pt able to engage knee throughout.    PATIENT EDUCATION: Education details: continue with HEP Person educated: Patient Education method: Explanation Education comprehension: verbalized understanding     HOME EXERCISE PROGRAM: Access Code: [email or web address] URL: [email or web address] Date: 09/10/2021 Prepared by: Cherly Anderson  Exercises - Supine Lower Trunk Rotation  - 2 x daily - 7 x weekly - 1 sets - 4 reps - 20 sec hold - Single Knee to Chest Stretch  - 2 x daily - 7 x weekly - 1 sets - 4 reps - 20 sec hold - Modified Thomas Stretch  - 2 x daily - 7 x weekly - 1 sets - 3 reps - 30 sec hold - Sit to Stand  - 2 x daily - 5 x weekly - 2 sets - 5 reps - Standing Hip Abduction with Counter Support  - 1 x daily - 5 x weekly - 2 sets - 10 reps - Tapping bottom of cabinet at sink  - 1 x daily - 5 x weekly - 2 sets - 10 reps - Standing Balance in Corner with Eyes Closed  - 2 x daily - 5 x weekly - 1 sets - 3 reps - 20 sec hold - Head turns side to side and up/down  - 2 x daily - 5 x  weekly - 1 sets - 3 reps - 20 sec hold - Semi-Tandem Corner Balance With Eyes Open  - 2 x daily - 5 x weekly - 1 sets - 2 reps - 20 sec hold        GOALS: Goals reviewed with patient? Yes   SHORT TERM GOALS: Target date: 11/08/2021   Pt will be independent with continued HEP for strengthening, balance and aquatic program. Baseline: Has initial HEP, Will plan to start aquatic program soon Goal status: Ongoing    2.  Pt will decrease TUG to <28 secs for improved balance and decreased fall risk. Baseline: 09/08/21: 32.68 seconds Goal status: REVISED    3.  Pt will be independent with prosthetic management when receives new socket for improved function. Baseline: Has new socket, but not her knee  Goal status: ONGOING    4.  Pt will be able to ambulate >400' on varied level surfaces (including simulated carpet) with walker while initiating knee flexion in prosthesis for improved mobility. Baseline: 09/08/21: 425 feet with RW/prosthesis on indoor level/outdoor paved surfaces with pt engaging knee with swing phase, need to work on carpeted surfaces  Goal status:REVISED    5. Pt will improve BERG balance score to 46/56 in order to indicate dec fall risk.   Baseline: 42/56  Goal status: REVISED   6. Pt will ambulate 150' with single loftstrand crutch vs quad tip cane at S level in order to indicate more independent household ambulation.   Baseline: 5' with crutch/cane at min A level   Goal status: NEW         LONG TERM GOALS: Target date:  12/06/21   Pt will be independent with progressive HEP for strengthening, balance and aerobic activity to continue gains on own. Baseline: 10/01/21: has HEP, however has limited time  to perform them at home due to daughters schedule/family members staying at her house so no room to do anything.  Goal status: ONGOING    2.  Pt will ambulate 200' with cane versus loftstrand crutch on unlevel short community distances at S level.  Baseline: 10/01/21:  gait with cane has been initiated in prior sessions, not to goal level Goal status: REVISED   3.  Pt will increase gait speed to 2.62 ft/sec w/ LRAD for improved mobility. Baseline: 10/01/21: 2.07 ft/sec or  0.63 m/s. Goal status: REVISED    4.  Pt will increase Berg to >/= 50/56 for improved balance and decreased fall risk. Baseline: 10/01/21: 42/56 scored today Goal status: REVISED    5.  Pt will negotiate 4 stairs w/ B crutches, up/down curb/ramp with single crutch vs cane at S level in order to indicate safe community mobility.   Baseline: min A at this time with RW  Goal Status: NEW    ASSESSMENT:   CLINICAL IMPRESSION:  Skilled session continued to focus on strengthening/activity tolerance and on training to engage prosthetic knee hydraulics. Able to progress to working on use of prosthetic knee hydraulics on stairs this session, pt will need more practice before doing this safely on her own as several times her prosthetic knee collapsed too quickly.  No issues noted or reported in session. The pt is progressing toward goals and should benefit from continued PT to progress toward unmet goals.       OBJECTIVE IMPAIRMENTS Abnormal gait, decreased activity tolerance, decreased balance, decreased knowledge of use of DME, decreased mobility, decreased strength, prosthetic dependency , and pain.    ACTIVITY LIMITATIONS cleaning and community activity.    PERSONAL FACTORS Time since onset of injury/illness/exacerbation and 3+ comorbidities:  DM2, obesity, and bipolar disorder, HTN, CHF, heart murmur, peripheral neuropathy, ESRD on dialysis  are also affecting patient's functional outcome.      REHAB POTENTIAL: Good   CLINICAL DECISION MAKING: Evolving/moderate complexity   EVALUATION COMPLEXITY: Moderate         PLAN: PT FREQUENCY: 2x/week   PT DURATION: 8 weeks   PLANNED INTERVENTIONS: Therapeutic exercises, Therapeutic activity, Neuromuscular re-education, Balance  training, Gait training, Patient/Family education, Joint mobilization, Stair training, Prosthetic training, DME instructions, Aquatic Therapy, Cryotherapy, Moist heat, and Manual therapy   PLAN FOR NEXT SESSION:  .  Gait with RW over mats to simulate carpet, gait with crutch over same surfaces, outdoor, Static and dynamic balance with decreased UE support, work on gait with cane vs forearm crutches, continue to work on activities that promote use of prosthetic knee hydraulics    Willow Ora, PTA, [place] [address], [place], [place] [address] [phone] 10/20/21, 10:43 PM

## 2021-10-21 DIAGNOSIS — L299 Pruritus, unspecified: Secondary | ICD-10-CM | POA: Diagnosis not present

## 2021-10-21 DIAGNOSIS — Z992 Dependence on renal dialysis: Secondary | ICD-10-CM | POA: Diagnosis not present

## 2021-10-21 DIAGNOSIS — N186 End stage renal disease: Secondary | ICD-10-CM | POA: Diagnosis not present

## 2021-10-21 DIAGNOSIS — N2581 Secondary hyperparathyroidism of renal origin: Secondary | ICD-10-CM | POA: Diagnosis not present

## 2021-10-22 ENCOUNTER — Ambulatory Visit
Payer: Medicare HMO | Attending: Student in an Organized Health Care Education/Training Program | Admitting: Physical Therapy

## 2021-10-22 ENCOUNTER — Encounter: Payer: Self-pay | Admitting: Physical Therapy

## 2021-10-22 DIAGNOSIS — R293 Abnormal posture: Secondary | ICD-10-CM | POA: Insufficient documentation

## 2021-10-22 DIAGNOSIS — R2689 Other abnormalities of gait and mobility: Secondary | ICD-10-CM | POA: Insufficient documentation

## 2021-10-22 DIAGNOSIS — R2681 Unsteadiness on feet: Secondary | ICD-10-CM | POA: Insufficient documentation

## 2021-10-22 DIAGNOSIS — M6281 Muscle weakness (generalized): Secondary | ICD-10-CM | POA: Diagnosis not present

## 2021-10-22 NOTE — Therapy (Signed)
OUTPATIENT PHYSICAL THERAPY TREATMENT NOTE  Patient Name: [name] [name] MRN: [id number] DOB:05/29/1975, 46 y.o., female Today's Date: 10/22/2021  PCP: Riesa Pope, MD REFERRING PROVIDER: Riesa Pope, *    10/22/21 1236  PT Visits / Re-Eval  Visit Number 13  Number of Visits 17  Date for PT Re-Evaluation 10/08/21  Authorization  Authorization Type [employer] medic[employer] with medicaid secondary. new auth to be submitted with recert at next session after 10/08/21  Authorization Time Period 17 visits 08/09/21-10/08/21;  Authorization - Visit Number 4  Progress Note Due on Visit 20  PT Time Calculation  PT Start Time 7673  PT Stop Time 1315  PT Time Calculation (min) 40 min  PT - End of Session  Equipment Utilized During Treatment Gait belt  Activity Tolerance Patient tolerated treatment well;No increased pain  Behavior During Therapy [facility] for tasks assessed/performed     Past Medical History:  Diagnosis Date   Abdominal muscle pain 09/08/2016   Abnormal Pap smear of cervix 2009   Abscess    history of multiple abscesses   Acute bilateral low back pain 02/13/2017   Acute blood loss anemia    Acute on chronic renal failure ([place]) 07/12/2012   Acute renal failure ([place]) 07/12/2012   Acute respiratory failure with hypoxia ([place]) 10/21/2019   Adjustment disorder with depressed mood 03/26/2017   AKI (acute kidney injury) ([place])    Anemia 10/21/2019   Anemia of chronic disease 2002   Anginal pain ([place])    Anxiety    Panic attacks   Bilateral lower extremity edema 05/13/2016   Bipolar disorder ([place])    Cataract    B/L cataract   Cellulitis 05/21/2014   right eye   CHF (congestive heart failure) ([place])    Chronic bronchitis ([place])    "get it q yr" (05/13/2013)   Chronic pain    Chronic pain of right knee 09/08/2016   Dehiscence of amputation stump ([place])    Depression    Edema of lower extremity    Endocarditis 2002   subacute bacterial endocarditis.    Fall     Family history of anesthesia complication    "my mom has a hard time coming out from under"   Fibromyalgia    GERD (gastroesophageal reflux disease)    occ   Heart murmur    Herpes simplex type 1 infection 01/16/2018   History of blood transfusion    "just low blood count" (05/13/2013)   Hyperlipidemia    Hypertension    Hypothyroidism    Hypothyroidism, adult 03/21/2014   Hypoxia    Leukocytosis    Muscle spasm 12/16/2020   Necrosis ([place])    and ulceration   Necrotizing fasciitis s/p OR debridements 07/06/2012   Obesity    OSA on CPAP    does not wear all the time   Peripheral neuropathy    Pneumonia    Pyelonephritis 12/31/2020   Routine adult health maintenance 12/08/2016   Severe protein-calorie malnutrition ([place])    Status post below knee amputation, left ([place]) 04/11/2017   Type II diabetes mellitus ([place])    Type  II   Vaginal irritation 03/14/2016   Weak pulse 07/21/2021   Wound dehiscence 09/04/2019   Past Surgical History:  Procedure Laterality Date   A/V FISTULAGRAM Left 05/12/2021   Procedure: A/V FISTULAGRAM;  Surgeon: Broadus John, MD;  Location: [facility];  Service: Cardiovascular;  Laterality: Left;   ABDOMINAL AORTOGRAM W/LOWER EXTREMITY Left 03/19/2019   Procedure: ABDOMINAL  AORTOGRAM W/LOWER EXTREMITY;  Surgeon: Serafina Mitchell, MD;  Location: [facility];  Service: Cardiovascular;  Laterality: Left;   ABOVE KNEE LEG AMPUTATION Left 10/11/2019   wound dehisence    AIR/FLUID EXCHANGE Right 12/31/2019   Procedure: AIR/FLUID EXCHANGE;  Surgeon: Jalene Mullet, MD;  Location: [place];  Service: Ophthalmology;  Laterality: Right;   AMPUTATION Left 04/07/2017   Procedure: LEFT BELOW KNEE AMPUTATION;  Surgeon: Newt Minion, MD;  Location: [place];  Service: Orthopedics;  Laterality: Left;   AMPUTATION Left 10/11/2019   Procedure: LEFT ABOVE KNEE AMPUTATION;  Surgeon: Newt Minion, MD;  Location: [facility];  Service: Orthopedics;  Laterality:  Left;   AV FISTULA PLACEMENT Left 01/05/2021   Procedure: ARTERIOVENOUS (AV) FISTULA CREATION vs. GRAFT;  Surgeon: Angelia Mould, MD;  Location: [facility];  Service: Vascular;  Laterality: Left;   CATARACT EXTRACTION W/PHACO Right 02/11/2020   Procedure: CATARACT EXTRACTION PHACO AND INTRAOCULAR LENS PLACEMENT ([place]);  Surgeon: Jalene Mullet, MD;  Location: [place];  Service: Ophthalmology;  Laterality: Right;   EYE SURGERY     lazer   FISTULA SUPERFICIALIZATION Left 02/23/2021   Procedure: SUPERFICIALIZATION OF LEFT BRACHIOCEPHALIC FISTULA;  Surgeon: Angelia Mould, MD;  Location: [place];  Service: Vascular;  Laterality: Left;   GAS INSERTION Right 12/31/2019   Procedure: INSERTION OF GAS;  Surgeon: Jalene Mullet, MD;  Location: [place];  Service: Ophthalmology;  Laterality: Right;   INCISION AND DRAINAGE ABSCESS     multiple I&Ds   INCISION AND DRAINAGE ABSCESS Left 07/09/2012   Procedure: DRESSING CHANGE, THIGH WOUND;  Surgeon: Harl Bowie, MD;  Location: [facility];  Service: General;  Laterality: Left;   INCISION AND DRAINAGE OF WOUND Left 07/07/2012   Procedure: IRRIGATION AND DEBRIDEMENT WOUND;  Surgeon: Harl Bowie, MD;  Location: [place];  Service: General;  Laterality: Left;   INCISION AND DRAINAGE PERIRECTAL ABSCESS Left 07/14/2012   Procedure: DEBRIDEMENT OF SKIN & SOFT TISSUE; DRESSING CHANGE UNDER ANESTHESIA;  Surgeon: Gayland Curry, MD,FACS;  Location: [place];  Service: General;  Laterality: Left;   INCISION AND DRAINAGE PERIRECTAL ABSCESS Left 07/16/2012   Procedure: I&D Left Thigh;  Surgeon: Gwenyth Ober, MD;  Location: [place] [place];  Service: General;  Laterality: Left;   INCISION AND DRAINAGE PERIRECTAL ABSCESS N/A 01/05/2015   Procedure: IRRIGATION AND DEBRIDEMENT PERIRECTAL ABSCESS;  Surgeon: Donnie Mesa, MD;  Location: [facility];  Service: General;  Laterality: N/A;   INSERTION OF DIALYSIS CATHETER Right 01/05/2021   Procedure: INSERTION OF DIALYSIS CATHETER;   Surgeon: Angelia Mould, MD;  Location: [facility];  Service: Vascular;  Laterality: Right;   IRRIGATION AND DEBRIDEMENT ABSCESS Left 07/06/2012   Procedure: IRRIGATION AND DEBRIDEMENT ABSCESS BUTTOCKS AND THIGH;  Surgeon: Shann Medal, MD;  Location: [facility];  Service: General;  Laterality: Left;   IRRIGATION AND DEBRIDEMENT ABSCESS Left 08/10/2012   Procedure: IRRIGATION AND DEBRIDEMENT ABSCESS;  Surgeon: Madilyn Hook, DO;  Location: [facility];  Service: General;  Laterality: Left;   PARS PLANA VITRECTOMY Right 12/31/2019   Procedure: PARS PLANA VITRECTOMY WITH 25 GAUGE;  Surgeon: Jalene Mullet, MD;  Location: [facility];  Service: Ophthalmology;  Laterality: Right;   PERIPHERAL VASCULAR INTERVENTION Left 05/12/2021   Procedure: PERIPHERAL VASCULAR INTERVENTION;  Surgeon: Broadus John, MD;  Location: [facility];  Service: Cardiovascular;  Laterality: Left;  arm fistula   PHOTOCOAGULATION WITH LASER Right 12/31/2019   Procedure: PHOTOCOAGULATION WITH LASER;  Surgeon:  Jalene Mullet, MD;  Location: [facility];  Service: Ophthalmology;  Laterality: Right;   REPAIR OF COMPLEX TRACTION RETINAL DETACHMENT Right 12/31/2019   Procedure: REPAIR OF COMPLEX TRACTION RETINAL;  Surgeon: Jalene Mullet, MD;  Location: [place] [facility];  Service: Ophthalmology;  Laterality: Right;   STUMP REVISION Left 06/09/2017   Procedure: REVISION LEFT BELOW KNEE AMPUTATION;  Surgeon: Newt Minion, MD;  Location: [facility];  Service: Orthopedics;  Laterality: Left;   STUMP REVISION Left 09/04/2019   Procedure: LEFT BELOW KNEE AMPUTATION REVISION;  Surgeon: Newt Minion, MD;  Location: [facility];  Service: Orthopedics;  Laterality: Left;   STUMP REVISION Left 09/20/2019   Procedure: LEFT BELOW KNEE AMPUTATION REVISION;  Surgeon: Newt Minion, MD;  Location: [facility];  Service: Orthopedics;  Laterality: Left;   TONSILLECTOMY  1994   Patient Active Problem List   Diagnosis Date Noted   Urgency of urination 09/12/2021   Hidradenitis  suppurativa 02/24/2021   Cough 05/26/2020   Chronic pain syndrome 01/16/2020   Hx of AKA (above knee amputation), left (HCC)    Blurry vision, right eye 10/08/2019   Tobacco use 03/01/2019   Phantom limb pain ([place]) 11/08/2018   Current moderate episode of major depressive disorder (HCC)    Hyperkalemia    Anemia of chronic disease    Bipolar affective disorder ([place])    Heart failure with preserved ejection fraction ([place]) 02/09/2017   Routine adult health maintenance 12/08/2016   End-stage renal disease on hemodialysis ([place]) 07/15/2016   OSA (obstructive sleep apnea) 05/13/2016   Morbid obesity due to excess calories ([place]) 11/27/2015   Subclinical hypothyroidism 04/25/2013   Controlled type 2 diabetes mellitus ([place]) 07/12/2012   Carpal tunnel syndrome, bilateral 01/25/2012   Diabetic polyneuropathy associated with type 2 diabetes mellitus ([place]) 07/04/2011   Anxiety and depression 06/02/2011   GERD 12/06/2007   Hyperlipidemia 08/29/2007   Hypertension associated with diabetes ([place]) 08/29/2007    REFERRING DIAG: L89.211 (ICD-10-CM) - Hx of AKA (above knee amputation), left (HCC)    THERAPY DIAG:  Other abnormalities of gait and mobility  Muscle weakness (generalized)  Unsteadiness on feet  Abnormal posture  PERTINENT HISTORY: ESRD, HTN, Chronic Pain, s/p left AKA    PRECAUTIONS: Fall, on dialysis T/Th/Sat. Fistula upper left arm   SUBJECTIVE: No new complaints. No falls or pain to report.Has  errands/dinner plans with her sister today.  PAIN:  Are you having pain? No. Reports Medicated right now.       TODAY'S TREATMENT:  10/22/2021: CURRENT PROSTHETIC WEAR ASSESSMENT: Donning prosthesis: Modified independence Doffing prosthesis: Modified independence Prosthetic wear tolerance: all awake hours Residual limb condition: intact per pt report Comments: continues with no issues with knee   STRENGTHENING  Scifit UE/LE's level 4.0  x 8 minutes with goal >/= 90 steps  per minute for strengthening and activity tolerance.   BALANCE/NMR In parallel bars working on activities to engage hydraulics of knee:  - with aerobic step working on using prosthesis to step down "riding hydraulics:" in block practice.cues for technique and weight shifting.   Standing next to mat table - mini squats with emphasis on equal LE weight bearing/knee engagement for 10 reps with no UE support for last 8 reps. Min guard assist for safety with PTA leg in front of prosthesis for safety.  - mini squats with pt picking up/placing down 5 pound Kettle bell for 5 reps with min guard assist, PTA knee in front of prosthetic knee for safety  THERAPEUTIC  ACTIVITY Began to work on floor transfers for fall recovery attempted to bring right leg forward and push up while straightening prosthetic knee. Prosthesis kept bending up/not going straight. Had pt bring prosthetic foot forward and push down though it while straightening her right knee up. Able to get prosthetic knee to straighten with this technique as well and then stepped right foot forward under her to turn and sit on mat table. UE support/assist required with min guard assist for safety with cues.    GAIT: Gait pattern: step through pattern, decreased stride length, and trunk flexed Distance walked: around clinic with session Assistive device utilized: Walker - 2 wheeled and prosthesis Level of assistance:  supervision to Mod I with walker; min guard to min assist for balance with gait    Comments: cues with gait with crutch for posture, decreased prosthetic step length. Pt able to engage knee throughout.    PATIENT EDUCATION: Education details: continue with HEP Person educated: Patient Education method: Explanation Education comprehension: verbalized understanding     HOME EXERCISE PROGRAM: Access Code: [email or web address] URL: [email or web address] Date: 09/10/2021 Prepared by: Cherly Anderson  Exercises - Supine Lower  Trunk Rotation  - 2 x daily - 7 x weekly - 1 sets - 4 reps - 20 sec hold - Single Knee to Chest Stretch  - 2 x daily - 7 x weekly - 1 sets - 4 reps - 20 sec hold - Modified Thomas Stretch  - 2 x daily - 7 x weekly - 1 sets - 3 reps - 30 sec hold - Sit to Stand  - 2 x daily - 5 x weekly - 2 sets - 5 reps - Standing Hip Abduction with Counter Support  - 1 x daily - 5 x weekly - 2 sets - 10 reps - Tapping bottom of cabinet at sink  - 1 x daily - 5 x weekly - 2 sets - 10 reps - Standing Balance in Corner with Eyes Closed  - 2 x daily - 5 x weekly - 1 sets - 3 reps - 20 sec hold - Head turns side to side and up/down  - 2 x daily - 5 x weekly - 1 sets - 3 reps - 20 sec hold - Semi-Tandem Corner Balance With Eyes Open  - 2 x daily - 5 x weekly - 1 sets - 2 reps - 20 sec hold        GOALS: Goals reviewed with patient? Yes   SHORT TERM GOALS: Target date: 11/08/2021   Pt will be independent with continued HEP for strengthening, balance and aquatic program. Baseline: Has initial HEP, Will plan to start aquatic program soon Goal status: Ongoing    2.  Pt will decrease TUG to <28 secs for improved balance and decreased fall risk. Baseline: 09/08/21: 32.68 seconds Goal status: REVISED    3.  Pt will be independent with prosthetic management when receives new socket for improved function. Baseline: Has new socket, but not her knee  Goal status: ONGOING    4.  Pt will be able to ambulate >400' on varied level surfaces (including simulated carpet) with walker while initiating knee flexion in prosthesis for improved mobility. Baseline: 09/08/21: 425 feet with RW/prosthesis on indoor level/outdoor paved surfaces with pt engaging knee with swing phase, need to work on carpeted surfaces  Goal status:REVISED    5. Pt will improve BERG balance score to 46/56 in order to indicate dec fall risk.   Baseline: 42/56  Goal status:  REVISED   6. Pt will ambulate 150' with single loftstrand crutch vs quad tip  cane at S level in order to indicate more independent household ambulation.   Baseline: 64' with crutch/cane at min A level   Goal status: NEW         LONG TERM GOALS: Target date:  12/06/21   Pt will be independent with progressive HEP for strengthening, balance and aerobic activity to continue gains on own. Baseline: 10/01/21: has HEP, however has limited time to perform them at home due to daughters schedule/family members staying at her house so no room to do anything.  Goal status: ONGOING    2.  Pt will ambulate 200' with cane versus loftstrand crutch on unlevel short community distances at S level.  Baseline: 10/01/21: gait with cane has been initiated in prior sessions, not to goal level Goal status: REVISED   3.  Pt will increase gait speed to 2.62 ft/sec w/ LRAD for improved mobility. Baseline: 10/01/21: 2.07 ft/sec or  0.63 m/s. Goal status: REVISED    4.  Pt will increase Berg to >/= 50/56 for improved balance and decreased fall risk. Baseline: 10/01/21: 42/56 scored today Goal status: REVISED    5.  Pt will negotiate 4 stairs w/ B crutches, up/down curb/ramp with single crutch vs cane at S level in order to indicate safe community mobility.   Baseline: min A at this time with RW  Goal Status: NEW    ASSESSMENT:   CLINICAL IMPRESSION:  Skilled session continued to focus on strengthening/activity tolerance and on training to engage prosthetic knee hydraulics. Also began floor transfers for fall recovery with prosthesis. Will benefit from additional practice. The pt is making progress and should benefit from continued PT to progress toward unmet goals.      OBJECTIVE IMPAIRMENTS Abnormal gait, decreased activity tolerance, decreased balance, decreased knowledge of use of DME, decreased mobility, decreased strength, prosthetic dependency , and pain.    ACTIVITY LIMITATIONS cleaning and community activity.    PERSONAL FACTORS Time since onset of  injury/illness/exacerbation and 3+ comorbidities:  DM2, obesity, and bipolar disorder, HTN, CHF, heart murmur, peripheral neuropathy, ESRD on dialysis  are also affecting patient's functional outcome.      REHAB POTENTIAL: Good   CLINICAL DECISION MAKING: Evolving/moderate complexity   EVALUATION COMPLEXITY: Moderate         PLAN: PT FREQUENCY: 2x/week   PT DURATION: 8 weeks   PLANNED INTERVENTIONS: Therapeutic exercises, Therapeutic activity, Neuromuscular re-education, Balance training, Gait training, Patient/Family education, Joint mobilization, Stair training, Prosthetic training, DME instructions, Aquatic Therapy, Cryotherapy, Moist heat, and Manual therapy   PLAN FOR NEXT SESSION:  .  Gait with RW over mats to simulate carpet, gait with crutch over same surfaces, outdoor, Static and dynamic balance with decreased UE support, work on gait with cane vs forearm crutches, continue to work on activities that promote use of prosthetic knee hydraulics    Willow Ora, PTA, [place] [address] [place], [place] [address] [phone] 10/22/21, 12:37 PM

## 2021-10-23 DIAGNOSIS — N186 End stage renal disease: Secondary | ICD-10-CM | POA: Diagnosis not present

## 2021-10-23 DIAGNOSIS — Z992 Dependence on renal dialysis: Secondary | ICD-10-CM | POA: Diagnosis not present

## 2021-10-23 DIAGNOSIS — N2581 Secondary hyperparathyroidism of renal origin: Secondary | ICD-10-CM | POA: Diagnosis not present

## 2021-10-23 DIAGNOSIS — L299 Pruritus, unspecified: Secondary | ICD-10-CM | POA: Diagnosis not present

## 2021-10-26 DIAGNOSIS — N2581 Secondary hyperparathyroidism of renal origin: Secondary | ICD-10-CM | POA: Diagnosis not present

## 2021-10-26 DIAGNOSIS — L299 Pruritus, unspecified: Secondary | ICD-10-CM | POA: Diagnosis not present

## 2021-10-26 DIAGNOSIS — Z992 Dependence on renal dialysis: Secondary | ICD-10-CM | POA: Diagnosis not present

## 2021-10-26 DIAGNOSIS — N186 End stage renal disease: Secondary | ICD-10-CM | POA: Diagnosis not present

## 2021-10-27 ENCOUNTER — Encounter: Payer: Self-pay | Admitting: Physical Therapy

## 2021-10-27 ENCOUNTER — Ambulatory Visit: Payer: Medicare HMO | Admitting: Physical Therapy

## 2021-10-27 DIAGNOSIS — R2689 Other abnormalities of gait and mobility: Secondary | ICD-10-CM

## 2021-10-27 DIAGNOSIS — R2681 Unsteadiness on feet: Secondary | ICD-10-CM

## 2021-10-27 DIAGNOSIS — R293 Abnormal posture: Secondary | ICD-10-CM

## 2021-10-27 DIAGNOSIS — M6281 Muscle weakness (generalized): Secondary | ICD-10-CM

## 2021-10-27 NOTE — Therapy (Signed)
OUTPATIENT PHYSICAL THERAPY TREATMENT NOTE  Patient Name: [name] [name] MRN: [id number] DOB:1975-09-12, 46 y.o., female Today's Date: 10/27/2021  PCP: Riesa Pope, MD REFERRING PROVIDER: Riesa Pope, *    PT End of Session - 10/27/21 1237     Visit Number 14    Number of Visits 17    Date for PT Re-Evaluation 10/08/21    Authorization Type [employer] medicare with medicaid secondary. new auth to be submitted with recert at next session after 10/08/21    Authorization Time Period 17 visits 08/09/21-10/08/21; 16 5/22- 12/06/2021    Authorization - Visit Number 5    Authorization - Number of Visits 16    Progress Note Due on Visit 20    PT Start Time 1232    PT Stop Time 1312    PT Time Calculation (min) 40 min    Equipment Utilized During Treatment Gait belt    Activity Tolerance Patient tolerated treatment well;No increased pain    Behavior During Therapy [facility] for tasks assessed/performed               Past Medical History:  Diagnosis Date   Abdominal muscle pain 09/08/2016   Abnormal Pap smear of cervix 2009   Abscess    history of multiple abscesses   Acute bilateral low back pain 02/13/2017   Acute blood loss anemia    Acute on chronic renal failure ([place]) 07/12/2012   Acute renal failure ([place]) 07/12/2012   Acute respiratory failure with hypoxia ([place]) 10/21/2019   Adjustment disorder with depressed mood 03/26/2017   AKI (acute kidney injury) ([place])    Anemia 10/21/2019   Anemia of chronic disease 2002   Anginal pain ([place])    Anxiety    Panic attacks   Bilateral lower extremity edema 05/13/2016   Bipolar disorder ([place])    Cataract    B/L cataract   Cellulitis 05/21/2014   right eye   CHF (congestive heart failure) ([place])    Chronic bronchitis ([place])    "get it q yr" (05/13/2013)   Chronic pain    Chronic pain of right knee 09/08/2016   Dehiscence of amputation stump ([place])    Depression    Edema of lower extremity    Endocarditis 2002    subacute bacterial endocarditis.    Fall    Family history of anesthesia complication    "my mom has a hard time coming out from under"   Fibromyalgia    GERD (gastroesophageal reflux disease)    occ   Heart murmur    Herpes simplex type 1 infection 01/16/2018   History of blood transfusion    "just low blood count" (05/13/2013)   Hyperlipidemia    Hypertension    Hypothyroidism    Hypothyroidism, adult 03/21/2014   Hypoxia    Leukocytosis    Muscle spasm 12/16/2020   Necrosis ([place])    and ulceration   Necrotizing fasciitis s/p OR debridements 07/06/2012   Obesity    OSA on CPAP    does not wear all the time   Peripheral neuropathy    Pneumonia    Pyelonephritis 12/31/2020   Routine adult health maintenance 12/08/2016   Severe protein-calorie malnutrition ([place])    Status post below knee amputation, left ([place]) 04/11/2017   Type II diabetes mellitus (HCC)    Type  II   Vaginal irritation 03/14/2016   Weak pulse 07/21/2021   Wound dehiscence 09/04/2019   Past Surgical History:  Procedure Laterality Date   A/V FISTULAGRAM  Left 05/12/2021   Procedure: A/V FISTULAGRAM;  Surgeon: Broadus John, MD;  Location: [place] [facility];  Service: Cardiovascular;  Laterality: Left;   ABDOMINAL AORTOGRAM W/LOWER EXTREMITY Left 03/19/2019   Procedure: ABDOMINAL AORTOGRAM W/LOWER EXTREMITY;  Surgeon: Serafina Mitchell, MD;  Location: [place] [facility];  Service: Cardiovascular;  Laterality: Left;   ABOVE KNEE LEG AMPUTATION Left 10/11/2019   wound dehisence    AIR/FLUID EXCHANGE Right 12/31/2019   Procedure: AIR/FLUID EXCHANGE;  Surgeon: Jalene Mullet, MD;  Location: [place];  Service: Ophthalmology;  Laterality: Right;   AMPUTATION Left 04/07/2017   Procedure: LEFT BELOW KNEE AMPUTATION;  Surgeon: Newt Minion, MD;  Location: [place];  Service: Orthopedics;  Laterality: Left;   AMPUTATION Left 10/11/2019   Procedure: LEFT ABOVE KNEE AMPUTATION;  Surgeon: Newt Minion, MD;  Location: [place];  Service: Orthopedics;  Laterality: Left;   AV FISTULA PLACEMENT Left 01/05/2021   Procedure: ARTERIOVENOUS (AV) FISTULA CREATION vs. GRAFT;  Surgeon: Angelia Mould, MD;  [facility];  Service: Vascular;  Laterality: Left;   CATARACT EXTRACTION W/PHACO Right 02/11/2020   Procedure: CATARACT EXTRACTION PHACO AND INTRAOCULAR LENS PLACEMENT ([place]);  Surgeon: Jalene Mullet, MD;  Location: [place];  Service: Ophthalmology;  Laterality: Right;   EYE SURGERY     lazer   FISTULA SUPERFICIALIZATION Left 02/23/2021   Procedure: SUPERFICIALIZATION OF LEFT BRACHIOCEPHALIC FISTULA;  Surgeon: Angelia Mould, MD;  Location: [place];  Service: Vascular;  Laterality: Left;   GAS INSERTION Right 12/31/2019   Procedure: INSERTION OF GAS;  Surgeon: Jalene Mullet, MD;  Location: [place];  Service: Ophthalmology;  Laterality: Right;   INCISION AND DRAINAGE ABSCESS     multiple I&Ds   INCISION AND DRAINAGE ABSCESS Left 07/09/2012   Procedure: DRESSING CHANGE, THIGH WOUND;  Surgeon: Harl Bowie, MD;  Location: [place];  Service: General;  Laterality: Left;   INCISION AND DRAINAGE OF WOUND Left 07/07/2012   Procedure: IRRIGATION AND DEBRIDEMENT WOUND;  Surgeon: Harl Bowie, MD;  Location: [place];  Service: General;  Laterality: Left;   INCISION AND DRAINAGE PERIRECTAL ABSCESS Left 07/14/2012   Procedure: DEBRIDEMENT OF SKIN & SOFT TISSUE; DRESSING CHANGE UNDER ANESTHESIA;  Surgeon: Gayland Curry, MD,FACS;  Location: [place];  Service: General;  Laterality: Left;   INCISION AND DRAINAGE PERIRECTAL ABSCESS Left 07/16/2012   Procedure: I&D Left Thigh;  Surgeon: Gwenyth Ober, MD;  Location: [place];  Service: General;  Laterality: Left;   INCISION AND DRAINAGE PERIRECTAL ABSCESS N/A 01/05/2015   Procedure: IRRIGATION AND DEBRIDEMENT PERIRECTAL ABSCESS;  Surgeon: Donnie Mesa, MD;  Location: [place];  Service: General;  Laterality: N/A;   INSERTION OF DIALYSIS CATHETER Right 01/05/2021    Procedure: INSERTION OF DIALYSIS CATHETER;  Surgeon: Angelia Mould, MD;  Location: [place];  Service: Vascular;  Laterality: Right;   IRRIGATION AND DEBRIDEMENT ABSCESS Left 07/06/2012   Procedure: IRRIGATION AND DEBRIDEMENT ABSCESS BUTTOCKS AND THIGH;  Surgeon: Shann Medal, MD;  Location: [place];  Service: General;  Laterality: Left;   IRRIGATION AND DEBRIDEMENT ABSCESS Left 08/10/2012   Procedure: IRRIGATION AND DEBRIDEMENT ABSCESS;  Surgeon: Madilyn Hook, DO;  Location: [place];  Service: General;  Laterality: Left;   PARS PLANA VITRECTOMY Right 12/31/2019   Procedure: PARS PLANA VITRECTOMY WITH 25 GAUGE;  Surgeon: Jalene Mullet, MD;  Location: [place];  Service: Ophthalmology;  Laterality: Right;   PERIPHERAL VASCULAR INTERVENTION Left 05/12/2021   Procedure: PERIPHERAL VASCULAR  INTERVENTION;  Surgeon: Broadus John, MD;  Location: [place] [facility];  Service: Cardiovascular;  Laterality: Left;  arm fistula   PHOTOCOAGULATION WITH LASER Right 12/31/2019   Procedure: PHOTOCOAGULATION WITH LASER;  Surgeon: Jalene Mullet, MD;  Location: [place];  Service: Ophthalmology;  Laterality: Right;   REPAIR OF COMPLEX TRACTION RETINAL DETACHMENT Right 12/31/2019   Procedure: REPAIR OF COMPLEX TRACTION RETINAL;  Surgeon: Jalene Mullet, MD;  Location: [place];  Service: Ophthalmology;  Laterality: Right;   STUMP REVISION Left 06/09/2017   Procedure: REVISION LEFT BELOW KNEE AMPUTATION;  Surgeon: Newt Minion, MD;  Location: [employer];  Service: Orthopedics;  Laterality: Left;   STUMP REVISION Left 09/04/2019   Procedure: LEFT BELOW KNEE AMPUTATION REVISION;  Surgeon: Newt Minion, MD;  Location: [place];  Service: Orthopedics;  Laterality: Left;   STUMP REVISION Left 09/20/2019   Procedure: LEFT BELOW KNEE AMPUTATION REVISION;  Surgeon: Newt Minion, MD;  Location: [place];  Service: Orthopedics;  Laterality: Left;   TONSILLECTOMY  1994   Patient Active Problem List   Diagnosis Date Noted    Urgency of urination 09/12/2021   Hidradenitis suppurativa 02/24/2021   Cough 05/26/2020   Chronic pain syndrome 01/16/2020   Hx of AKA (above knee amputation), left (HCC)    Blurry vision, right eye 10/08/2019   Tobacco use 03/01/2019   Phantom limb pain ([place]) 11/08/2018   Current moderate episode of major depressive disorder (HCC)    Hyperkalemia    Anemia of chronic disease    Bipolar affective disorder ([place])    Heart failure with preserved ejection fraction ([place]) 02/09/2017   Routine adult health maintenance 12/08/2016   End-stage renal disease on hemodialysis ([place]) 07/15/2016   OSA (obstructive sleep apnea) 05/13/2016   Morbid obesity due to excess calories ([place]) 11/27/2015   Subclinical hypothyroidism 04/25/2013   Controlled type 2 diabetes mellitus ([place]) 07/12/2012   Carpal tunnel syndrome, bilateral 01/25/2012   Diabetic polyneuropathy associated with type 2 diabetes mellitus ([place]) 07/04/2011   Anxiety and depression 06/02/2011   GERD 12/06/2007   Hyperlipidemia 08/29/2007   Hypertension associated with diabetes ([place]) 08/29/2007    REFERRING DIAG: Z36.644 (ICD-10-CM) - Hx of AKA (above knee amputation), left (HCC)    THERAPY DIAG:  Other abnormalities of gait and mobility  Muscle weakness (generalized)  Unsteadiness on feet  Abnormal posture  PERTINENT HISTORY: ESRD, HTN, Chronic Pain, s/p left AKA    PRECAUTIONS: Fall, on dialysis T/Th/Sat. Fistula upper left arm   SUBJECTIVE: No new complaints. Had a rough day at HD Saturday due to BP dropping. Still feeling run down today.   PAIN:  Are you having pain? No. Reports Medicated right now.       TODAY'S TREATMENT:  06/072023: CURRENT PROSTHETIC WEAR ASSESSMENT: Donning prosthesis: Modified independence Doffing prosthesis: Modified independence Prosthetic wear tolerance: all awake hours Residual limb condition: intact per pt report Comments: continues with no issues with knee. Reports the knee continues  to bend as it is supposed to and that ramps are going better.   STRENGTHENING  Scifit UE/LE's level 4.5  x 8 minutes with goal ~ 70-80 steps per minute for strengthening and activity tolerance.   BALANCE/NMR In parallel bars working on activities to engage hydraulics of knee:  - with aerobic step working on using prosthesis to step down "riding hydraulics:" in block practice.cues for technique and weight shifting.   Standing next to mat table - mini squats with emphasis on equal LE weight  bearing/knee engagement with no UE support for 10 reps. Min guard assist for safety with PTA leg in front of prosthesis for safety, occasional touch to chair for balance recovery after return to tall standing.  - mini squats with pt picking up/placing down 5 pound Kettle bell for 5 reps, 2 sets,  with min guard assist, PTA knee in front of prosthetic knee for safety. Increased need for support on chair/from PTA on 2 cd set.  At stairs:  - using bil rails to ride hydraulics down for 4 steps x 2 reps, PTA guarding at prosthetic knee for when it goes to quickly for safety. Pt able to fully engage hydraulics for 6 of 8 reps with assist needed only on the other 2 reps.    GAIT: Gait pattern: step through pattern, decreased stride length, and trunk flexed Distance walked: around clinic with session Assistive device utilized: Walker - 2 wheeled and prosthesis Level of assistance:  supervision to Mod I with walker; min guard to min assist for balance with gait    Comments: cues with gait with crutch for posture, decreased prosthetic step length. Pt able to engage knee throughout.    PATIENT EDUCATION: Education details: continue with HEP Person educated: Patient Education method: Explanation Education comprehension: verbalized understanding     HOME EXERCISE PROGRAM: Access Code: [email or web address] URL: [email or web address] Date: 09/10/2021 Prepared by: Cherly Anderson  Exercises - Supine Lower  Trunk Rotation  - 2 x daily - 7 x weekly - 1 sets - 4 reps - 20 sec hold - Single Knee to Chest Stretch  - 2 x daily - 7 x weekly - 1 sets - 4 reps - 20 sec hold - Modified Thomas Stretch  - 2 x daily - 7 x weekly - 1 sets - 3 reps - 30 sec hold - Sit to Stand  - 2 x daily - 5 x weekly - 2 sets - 5 reps - Standing Hip Abduction with Counter Support  - 1 x daily - 5 x weekly - 2 sets - 10 reps - Tapping bottom of cabinet at sink  - 1 x daily - 5 x weekly - 2 sets - 10 reps - Standing Balance in Corner with Eyes Closed  - 2 x daily - 5 x weekly - 1 sets - 3 reps - 20 sec hold - Head turns side to side and up/down  - 2 x daily - 5 x weekly - 1 sets - 3 reps - 20 sec hold - Semi-Tandem Corner Balance With Eyes Open  - 2 x daily - 5 x weekly - 1 sets - 2 reps - 20 sec hold        GOALS: Goals reviewed with patient? Yes   SHORT TERM GOALS: Target date: 11/08/2021   Pt will be independent with continued HEP for strengthening, balance and aquatic program. Baseline: Has initial HEP, Will plan to start aquatic program soon Goal status: Ongoing    2.  Pt will decrease TUG to <28 secs for improved balance and decreased fall risk. Baseline: 09/08/21: 32.68 seconds Goal status: REVISED    3.  Pt will be independent with prosthetic management when receives new socket for improved function. Baseline: Has new socket, but not her knee  Goal status: ONGOING    4.  Pt will be able to ambulate >400' on varied level surfaces (including simulated carpet) with walker while initiating knee flexion in prosthesis for improved mobility. Baseline: 09/08/21: 425 feet with RW/prosthesis on indoor  level/outdoor paved surfaces with pt engaging knee with swing phase, need to work on carpeted surfaces  Goal status:REVISED    5. Pt will improve BERG balance score to 46/56 in order to indicate dec fall risk.   Baseline: 42/56  Goal status: REVISED   6. Pt will ambulate 150' with single loftstrand crutch vs quad tip  cane at S level in order to indicate more independent household ambulation.   Baseline: 27' with crutch/cane at min A level   Goal status: NEW         LONG TERM GOALS: Target date:  12/06/21   Pt will be independent with progressive HEP for strengthening, balance and aerobic activity to continue gains on own. Baseline: 10/01/21: has HEP, however has limited time to perform them at home due to daughters schedule/family members staying at her house so no room to do anything.  Goal status: ONGOING    2.  Pt will ambulate 200' with cane versus loftstrand crutch on unlevel short community distances at S level.  Baseline: 10/01/21: gait with cane has been initiated in prior sessions, not to goal level Goal status: REVISED   3.  Pt will increase gait speed to 2.62 ft/sec w/ LRAD for improved mobility. Baseline: 10/01/21: 2.07 ft/sec or  0.63 m/s. Goal status: REVISED    4.  Pt will increase Berg to >/= 50/56 for improved balance and decreased fall risk. Baseline: 10/01/21: 42/56 scored today Goal status: REVISED    5.  Pt will negotiate 4 stairs w/ B crutches, up/down curb/ramp with single crutch vs cane at S level in order to indicate safe community mobility.   Baseline: min A at this time with RW  Goal Status: NEW    ASSESSMENT:   CLINICAL IMPRESSION:  Skilled session continued to focus on strengthening/activity tolerance and on training to engage prosthetic knee hydraulics. Mild increase in right knee/hip pain toward end of session with pt reporting "it's bearable". Decreased with rest breaks. The pt continues to progress with less cues needed to engage prosthetic knee and for weight shifting over prosthesis. The pt should benefit from continued PT to progress toward unmet goals.       OBJECTIVE IMPAIRMENTS Abnormal gait, decreased activity tolerance, decreased balance, decreased knowledge of use of DME, decreased mobility, decreased strength, prosthetic dependency , and pain.     ACTIVITY LIMITATIONS cleaning and community activity.    PERSONAL FACTORS Time since onset of injury/illness/exacerbation and 3+ comorbidities:  DM2, obesity, and bipolar disorder, HTN, CHF, heart murmur, peripheral neuropathy, ESRD on dialysis  are also affecting patient's functional outcome.      REHAB POTENTIAL: Good   CLINICAL DECISION MAKING: Evolving/moderate complexity   EVALUATION COMPLEXITY: Moderate         PLAN: PT FREQUENCY: 2x/week   PT DURATION: 8 weeks   PLANNED INTERVENTIONS: Therapeutic exercises, Therapeutic activity, Neuromuscular re-education, Balance training, Gait training, Patient/Family education, Joint mobilization, Stair training, Prosthetic training, DME instructions, Aquatic Therapy, Cryotherapy, Moist heat, and Manual therapy   PLAN FOR NEXT SESSION:  .  Gait with RW over mats to simulate carpet, gait with crutch over same surfaces, outdoor, Static and dynamic balance with decreased UE support, work on gait with cane vs forearm crutches, continue to work on activities that promote use of prosthetic knee hydraulics    Willow Ora, PTA, [employer] [address] [place], [place] [address] [phone] 10/27/21, 2:36 PM

## 2021-10-28 DIAGNOSIS — N2581 Secondary hyperparathyroidism of renal origin: Secondary | ICD-10-CM | POA: Diagnosis not present

## 2021-10-28 DIAGNOSIS — Z992 Dependence on renal dialysis: Secondary | ICD-10-CM | POA: Diagnosis not present

## 2021-10-28 DIAGNOSIS — L299 Pruritus, unspecified: Secondary | ICD-10-CM | POA: Diagnosis not present

## 2021-10-28 DIAGNOSIS — N186 End stage renal disease: Secondary | ICD-10-CM | POA: Diagnosis not present

## 2021-10-29 ENCOUNTER — Ambulatory Visit: Payer: Medicare HMO

## 2021-10-29 DIAGNOSIS — M6281 Muscle weakness (generalized): Secondary | ICD-10-CM | POA: Diagnosis not present

## 2021-10-29 DIAGNOSIS — R2681 Unsteadiness on feet: Secondary | ICD-10-CM

## 2021-10-29 DIAGNOSIS — R293 Abnormal posture: Secondary | ICD-10-CM | POA: Diagnosis not present

## 2021-10-29 DIAGNOSIS — R2689 Other abnormalities of gait and mobility: Secondary | ICD-10-CM

## 2021-10-29 NOTE — Therapy (Signed)
OUTPATIENT PHYSICAL THERAPY TREATMENT NOTE  Patient Name: [name] [name] MRN: [id number] DOB:Mar 19, 1976, 46 y.o., female Today's Date: 10/29/2021  PCP: Riesa Pope, MD REFERRING PROVIDER: Riesa Pope, *    PT End of Session - 10/29/21 0909     Visit Number 15    Number of Visits 17    Date for PT Re-Evaluation 10/08/21    Authorization Type [employer] with medicaid secondary. new auth to be submitted with recert at next session after 10/08/21    Authorization Time Period 17 visits 08/09/21-10/08/21; 16 5/22- 12/06/2021    Authorization - Visit Number 6    Authorization - Number of Visits 16    Progress Note Due on Visit 20    PT Start Time 0910    PT Stop Time 0955    PT Time Calculation (min) 45 min    Equipment Utilized During Treatment Gait belt    Activity Tolerance Patient tolerated treatment well;No increased pain    Behavior During Therapy [facility] for tasks assessed/performed               Past Medical History:  Diagnosis Date   Abdominal muscle pain 09/08/2016   Abnormal Pap smear of cervix 2009   Abscess    history of multiple abscesses   Acute bilateral low back pain 02/13/2017   Acute blood loss anemia    Acute on chronic renal failure ([place]) 07/12/2012   Acute renal failure ([place]) 07/12/2012   Acute respiratory failure with hypoxia ([place]) 10/21/2019   Adjustment disorder with depressed mood 03/26/2017   AKI (acute kidney injury) ([place])    Anemia 10/21/2019   Anemia of chronic disease 2002   Anginal pain ([place])    Anxiety    Panic attacks   Bilateral lower extremity edema 05/13/2016   Bipolar disorder ([place])    Cataract    B/L cataract   Cellulitis 05/21/2014   right eye   CHF (congestive heart failure) ([place])    Chronic bronchitis ([place])    "get it q yr" (05/13/2013)   Chronic pain    Chronic pain of right knee 09/08/2016   Dehiscence of amputation stump ([place])    Depression    Edema of lower extremity    Endocarditis 2002    subacute bacterial endocarditis.    Fall    Family history of anesthesia complication    "my mom has a hard time coming out from under"   Fibromyalgia    GERD (gastroesophageal reflux disease)    occ   Heart murmur    Herpes simplex type 1 infection 01/16/2018   History of blood transfusion    "just low blood count" (05/13/2013)   Hyperlipidemia    Hypertension    Hypothyroidism    Hypothyroidism, adult 03/21/2014   Hypoxia    Leukocytosis    Muscle spasm 12/16/2020   Necrosis ([place])    and ulceration   Necrotizing fasciitis s/p OR debridements 07/06/2012   Obesity    OSA on CPAP    does not wear all the time   Peripheral neuropathy    Pneumonia    Pyelonephritis 12/31/2020   Routine adult health maintenance 12/08/2016   Severe protein-calorie malnutrition ([place])    Status post below knee amputation, left ([place]) 04/11/2017   Type II diabetes mellitus (HCC)    Type  II   Vaginal irritation 03/14/2016   Weak pulse 07/21/2021   Wound dehiscence 09/04/2019   Past Surgical History:  Procedure Laterality Date   A/V FISTULAGRAM  Left 05/12/2021   Procedure: A/V FISTULAGRAM;  Surgeon: Broadus John, MD;  Location: [place] [facility];  Service: Cardiovascular;  Laterality: Left;   ABDOMINAL AORTOGRAM W/LOWER EXTREMITY Left 03/19/2019   Procedure: ABDOMINAL AORTOGRAM W/LOWER EXTREMITY;  Surgeon: Serafina Mitchell, MD;  Location: [place] [facility];  Service: Cardiovascular;  Laterality: Left;   ABOVE KNEE LEG AMPUTATION Left 10/11/2019   wound dehisence    AIR/FLUID EXCHANGE Right 12/31/2019   Procedure: AIR/FLUID EXCHANGE;  Surgeon: Jalene Mullet, MD;  Location: [place];  Service: Ophthalmology;  Laterality: Right;   AMPUTATION Left 04/07/2017   Procedure: LEFT BELOW KNEE AMPUTATION;  Surgeon: Newt Minion, MD;  Location: [place];  Service: Orthopedics;  Laterality: Left;   AMPUTATION Left 10/11/2019   Procedure: LEFT ABOVE KNEE AMPUTATION;  Surgeon: Newt Minion, MD;  Location: [place];  Service: Orthopedics;  Laterality: Left;   AV FISTULA PLACEMENT Left 01/05/2021   Procedure: ARTERIOVENOUS (AV) FISTULA CREATION vs. GRAFT;  Surgeon: Angelia Mould, MD;  [facility];  Service: Vascular;  Laterality: Left;   CATARACT EXTRACTION W/PHACO Right 02/11/2020   Procedure: CATARACT EXTRACTION PHACO AND INTRAOCULAR LENS PLACEMENT ([place]);  Surgeon: Jalene Mullet, MD;  Location: [place];  Service: Ophthalmology;  Laterality: Right;   EYE SURGERY     lazer   FISTULA SUPERFICIALIZATION Left 02/23/2021   Procedure: SUPERFICIALIZATION OF LEFT BRACHIOCEPHALIC FISTULA;  Surgeon: Angelia Mould, MD;  Location: [place];  Service: Vascular;  Laterality: Left;   GAS INSERTION Right 12/31/2019   Procedure: INSERTION OF GAS;  Surgeon: Jalene Mullet, MD;  Location: [place];  Service: Ophthalmology;  Laterality: Right;   INCISION AND DRAINAGE ABSCESS     multiple I&Ds   INCISION AND DRAINAGE ABSCESS Left 07/09/2012   Procedure: DRESSING CHANGE, THIGH WOUND;  Surgeon: Harl Bowie, MD;  Location: [place];  Service: General;  Laterality: Left;   INCISION AND DRAINAGE OF WOUND Left 07/07/2012   Procedure: IRRIGATION AND DEBRIDEMENT WOUND;  Surgeon: Harl Bowie, MD;  Location: [place];  Service: General;  Laterality: Left;   INCISION AND DRAINAGE PERIRECTAL ABSCESS Left 07/14/2012   Procedure: DEBRIDEMENT OF SKIN & SOFT TISSUE; DRESSING CHANGE UNDER ANESTHESIA;  Surgeon: Gayland Curry, MD,FACS;  Location: [place];  Service: General;  Laterality: Left;   INCISION AND DRAINAGE PERIRECTAL ABSCESS Left 07/16/2012   Procedure: I&D Left Thigh;  Surgeon: Gwenyth Ober, MD;  Location: [place];  Service: General;  Laterality: Left;   INCISION AND DRAINAGE PERIRECTAL ABSCESS N/A 01/05/2015   Procedure: IRRIGATION AND DEBRIDEMENT PERIRECTAL ABSCESS;  Surgeon: Donnie Mesa, MD;  Location: [place];  Service: General;  Laterality: N/A;   INSERTION OF DIALYSIS CATHETER Right 01/05/2021    Procedure: INSERTION OF DIALYSIS CATHETER;  Surgeon: Angelia Mould, MD;  Location: [place];  Service: Vascular;  Laterality: Right;   IRRIGATION AND DEBRIDEMENT ABSCESS Left 07/06/2012   Procedure: IRRIGATION AND DEBRIDEMENT ABSCESS BUTTOCKS AND THIGH;  Surgeon: Shann Medal, MD;  Location: [place];  Service: General;  Laterality: Left;   IRRIGATION AND DEBRIDEMENT ABSCESS Left 08/10/2012   Procedure: IRRIGATION AND DEBRIDEMENT ABSCESS;  Surgeon: Madilyn Hook, DO;  Location: [place];  Service: General;  Laterality: Left;   PARS PLANA VITRECTOMY Right 12/31/2019   Procedure: PARS PLANA VITRECTOMY WITH 25 GAUGE;  Surgeon: Jalene Mullet, MD;  Location: [place];  Service: Ophthalmology;  Laterality: Right;   PERIPHERAL VASCULAR INTERVENTION Left 05/12/2021   Procedure: PERIPHERAL VASCULAR  INTERVENTION;  Surgeon: Broadus John, MD;  Location: [place] [facility];  Service: Cardiovascular;  Laterality: Left;  arm fistula   PHOTOCOAGULATION WITH LASER Right 12/31/2019   Procedure: PHOTOCOAGULATION WITH LASER;  Surgeon: Jalene Mullet, MD;  Location: [place];  Service: Ophthalmology;  Laterality: Right;   REPAIR OF COMPLEX TRACTION RETINAL DETACHMENT Right 12/31/2019   Procedure: REPAIR OF COMPLEX TRACTION RETINAL;  Surgeon: Jalene Mullet, MD;  Location: [place];  Service: Ophthalmology;  Laterality: Right;   STUMP REVISION Left 06/09/2017   Procedure: REVISION LEFT BELOW KNEE AMPUTATION;  Surgeon: Newt Minion, MD;  Location: [place];  Service: Orthopedics;  Laterality: Left;   STUMP REVISION Left 09/04/2019   Procedure: LEFT BELOW KNEE AMPUTATION REVISION;  Surgeon: Newt Minion, MD;  Location: [place];  Service: Orthopedics;  Laterality: Left;   STUMP REVISION Left 09/20/2019   Procedure: LEFT BELOW KNEE AMPUTATION REVISION;  Surgeon: Newt Minion, MD;  Location: [place];  Service: Orthopedics;  Laterality: Left;   TONSILLECTOMY  1994   Patient Active Problem List   Diagnosis Date Noted    Urgency of urination 09/12/2021   Hidradenitis suppurativa 02/24/2021   Cough 05/26/2020   Chronic pain syndrome 01/16/2020   Hx of AKA (above knee amputation), left (HCC)    Blurry vision, right eye 10/08/2019   Tobacco use 03/01/2019   Phantom limb pain ([place]) 11/08/2018   Current moderate episode of major depressive disorder (HCC)    Hyperkalemia    Anemia of chronic disease    Bipolar affective disorder ([place])    Heart failure with preserved ejection fraction ([place]) 02/09/2017   Routine adult health maintenance 12/08/2016   End-stage renal disease on hemodialysis ([place]) 07/15/2016   OSA (obstructive sleep apnea) 05/13/2016   Morbid obesity due to excess calories ([place]) 11/27/2015   Subclinical hypothyroidism 04/25/2013   Controlled type 2 diabetes mellitus ([place]) 07/12/2012   Carpal tunnel syndrome, bilateral 01/25/2012   Diabetic polyneuropathy associated with type 2 diabetes mellitus ([place]) 07/04/2011   Anxiety and depression 06/02/2011   GERD 12/06/2007   Hyperlipidemia 08/29/2007   Hypertension associated with diabetes ([place]) 08/29/2007    REFERRING DIAG: O13.086 (ICD-10-CM) - Hx of AKA (above knee amputation), left (HCC)    THERAPY DIAG:  Other abnormalities of gait and mobility  Muscle weakness (generalized)  Unsteadiness on feet  PERTINENT HISTORY: ESRD, HTN, Chronic Pain, s/p left AKA    PRECAUTIONS: Fall, on dialysis T/Th/Sat. Fistula upper left arm   SUBJECTIVE: PT reports she has no rails with 3 steps at home she scoots down those steps.  PAIN:  Are you having pain? No. Reports Medicated right now.       TODAY'S TREATMENT:  TUG assessed: 26 sec with walker New tennis balls put on the walker Gait training with st. Cane: 1 x 115' with 2 pt gait pattern and CGA, pt educated on 2 pt gait patttern prior to walking Practiced TUG with st. Cane: Trial 1: 25 sec Trial 2: 31 sec Trial 3:24 sec Trial 4: trialed but did not start stop watch in time Trial 5: 26  sec Pt educated on shifting weight anterior and to the left during L stance phase (shift away from cane) to improve WB through L LE and decreased weight bearing through R UE and decreased R excessive lateral lean Gait training: 1 x 115' cues as above, CGA Stair training: 1 rail and 1 cane: step to pattern; trialed with rail on R and raile on  L 4 steps each: total 8 steps with verbal cues for sequencing and SBA      PATIENT EDUCATION: Education details: continue with HEP Person educated: Patient Education method: Explanation Education comprehension: verbalized understanding     HOME EXERCISE PROGRAM: Access Code: [email or web address] URL: [email or web address] Date: 09/10/2021 Prepared by: Cherly Anderson  Exercises - Supine Lower Trunk Rotation  - 2 x daily - 7 x weekly - 1 sets - 4 reps - 20 sec hold - Single Knee to Chest Stretch  - 2 x daily - 7 x weekly - 1 sets - 4 reps - 20 sec hold - Modified Thomas Stretch  - 2 x daily - 7 x weekly - 1 sets - 3 reps - 30 sec hold - Sit to Stand  - 2 x daily - 5 x weekly - 2 sets - 5 reps - Standing Hip Abduction with Counter Support  - 1 x daily - 5 x weekly - 2 sets - 10 reps - Tapping bottom of cabinet at sink  - 1 x daily - 5 x weekly - 2 sets - 10 reps - Standing Balance in Corner with Eyes Closed  - 2 x daily - 5 x weekly - 1 sets - 3 reps - 20 sec hold - Head turns side to side and up/down  - 2 x daily - 5 x weekly - 1 sets - 3 reps - 20 sec hold - Semi-Tandem Corner Balance With Eyes Open  - 2 x daily - 5 x weekly - 1 sets - 2 reps - 20 sec hold        GOALS: Goals reviewed with patient? Yes   SHORT TERM GOALS: Target date: 11/08/2021   Pt will be independent with continued HEP for strengthening, balance and aquatic program. Baseline: Has initial HEP, Will plan to start aquatic program soon Goal status: Ongoing    2.  Pt will decrease TUG to <28 secs for improved balance and decreased fall risk. Baseline: 09/08/21: 32.68  seconds; 26 seconds (10/29/21) Goal status: Goal met   3.  Pt will be independent with prosthetic management when receives new socket for improved function. Baseline: Has new socket, but not her knee  Goal status: ONGOING    4.  Pt will be able to ambulate >400' on varied level surfaces (including simulated carpet) with walker while initiating knee flexion in prosthesis for improved mobility. Baseline: 09/08/21: 425 feet with RW/prosthesis on indoor level/outdoor paved surfaces with pt engaging knee with swing phase, need to work on carpeted surfaces  Goal status:REVISED    5. Pt will improve BERG balance score to 46/56 in order to indicate dec fall risk.   Baseline: 42/56  Goal status: REVISED   6. Pt will ambulate 150' with single loftstrand crutch vs quad tip cane at S level in order to indicate more independent household ambulation.   Baseline: 50' with crutch/cane at min A level ; 115' with st. Point cane with CGA  Goal status: NEW         LONG TERM GOALS: Target date:  12/06/21   Pt will be independent with progressive HEP for strengthening, balance and aerobic activity to continue gains on own. Baseline: 10/01/21: has HEP, however has limited time to perform them at home due to daughters schedule/family members staying at her house so no room to do anything.  Goal status: ONGOING    2.  Pt will ambulate 200' with cane versus loftstrand crutch on unlevel short community distances  at S level.  Baseline: 10/01/21: gait with cane has been initiated in prior sessions, not to goal level; 115' with st. Point cane with CGA Goal status: progressing   3.  Pt will increase gait speed to 2.62 ft/sec w/ LRAD for improved mobility. Baseline: 10/01/21: 2.07 ft/sec or  0.63 m/s. Goal status: REVISED    4.  Pt will increase Berg to >/= 50/56 for improved balance and decreased fall risk. Baseline: 10/01/21: 42/56 scored today Goal status: REVISED    5.  Pt will negotiate 4 stairs w/ B  crutches, up/down curb/ramp with single crutch vs cane at S level in order to indicate safe community mobility.   Baseline: min A at this time with RW  Goal Status: NEW    ASSESSMENT:   CLINICAL IMPRESSION:  Goas MET: STG#2. Pt did well with ambulation with  st. Point cane where she hesitates a little with initiation and with turns. We also practiced going up and down with cane and one rail which she will benefit from progressing to level where she doesn't use any rails (use st. Point cane or bil foreqrm crutches.       OBJECTIVE IMPAIRMENTS Abnormal gait, decreased activity tolerance, decreased balance, decreased knowledge of use of DME, decreased mobility, decreased strength, prosthetic dependency , and pain.    ACTIVITY LIMITATIONS cleaning and community activity.    PERSONAL FACTORS Time since onset of injury/illness/exacerbation and 3+ comorbidities:  DM2, obesity, and bipolar disorder, HTN, CHF, heart murmur, peripheral neuropathy, ESRD on dialysis  are also affecting patient's functional outcome.      REHAB POTENTIAL: Good   CLINICAL DECISION MAKING: Evolving/moderate complexity   EVALUATION COMPLEXITY: Moderate         PLAN: PT FREQUENCY: 2x/week   PT DURATION: 8 weeks   PLANNED INTERVENTIONS: Therapeutic exercises, Therapeutic activity, Neuromuscular re-education, Balance training, Gait training, Patient/Family education, Joint mobilization, Stair training, Prosthetic training, DME instructions, Aquatic Therapy, Cryotherapy, Moist heat, and Manual therapy   PLAN FOR NEXT SESSION:  . Gait wit st. Point cane 230' x 2, practice st. Cane with one rail with stairs, work on turning with st. Point cane, stepping over obstacles, if time remaining pratice ramp and curb with st. Point cane.   Kerrie Pleasure, PT 10/29/2021, 9:54 AM

## 2021-10-30 DIAGNOSIS — Z992 Dependence on renal dialysis: Secondary | ICD-10-CM | POA: Diagnosis not present

## 2021-10-30 DIAGNOSIS — N2581 Secondary hyperparathyroidism of renal origin: Secondary | ICD-10-CM | POA: Diagnosis not present

## 2021-10-30 DIAGNOSIS — N186 End stage renal disease: Secondary | ICD-10-CM | POA: Diagnosis not present

## 2021-10-30 DIAGNOSIS — L299 Pruritus, unspecified: Secondary | ICD-10-CM | POA: Diagnosis not present

## 2021-11-01 ENCOUNTER — Other Ambulatory Visit: Payer: Self-pay | Admitting: Student

## 2021-11-01 DIAGNOSIS — Z89612 Acquired absence of left leg above knee: Secondary | ICD-10-CM

## 2021-11-02 DIAGNOSIS — N186 End stage renal disease: Secondary | ICD-10-CM | POA: Diagnosis not present

## 2021-11-02 DIAGNOSIS — Z992 Dependence on renal dialysis: Secondary | ICD-10-CM | POA: Diagnosis not present

## 2021-11-02 DIAGNOSIS — L299 Pruritus, unspecified: Secondary | ICD-10-CM | POA: Diagnosis not present

## 2021-11-02 DIAGNOSIS — N2581 Secondary hyperparathyroidism of renal origin: Secondary | ICD-10-CM | POA: Diagnosis not present

## 2021-11-03 ENCOUNTER — Encounter: Payer: Self-pay | Admitting: Physical Therapy

## 2021-11-03 ENCOUNTER — Ambulatory Visit: Payer: Medicare HMO | Admitting: Physical Therapy

## 2021-11-03 DIAGNOSIS — R2681 Unsteadiness on feet: Secondary | ICD-10-CM

## 2021-11-03 DIAGNOSIS — R2689 Other abnormalities of gait and mobility: Secondary | ICD-10-CM

## 2021-11-03 DIAGNOSIS — R293 Abnormal posture: Secondary | ICD-10-CM

## 2021-11-03 DIAGNOSIS — M6281 Muscle weakness (generalized): Secondary | ICD-10-CM

## 2021-11-03 NOTE — Therapy (Signed)
OUTPATIENT PHYSICAL THERAPY TREATMENT NOTE  Patient Name: Carlean Crowl MRN: [id number] DOB:1975-09-02, 46 y.o., female Today's Date: 11/03/2021  PCP: Riesa Pope, MD REFERRING PROVIDER: Riesa Pope, *    PT End of Session - 11/03/21 1453     Visit Number 16    Number of Visits 33   updated to reflect recert from 0/93/23   Date for PT Re-Evaluation 12/06/21    Authorization Type [employer] medic[employer] with medicaid secondary. new auth to be submitted with recert at next session after 10/08/21    Authorization Time Period 17 visits 08/09/21-10/08/21; 16 visits  5/22- 12/06/2021    Authorization - Visit Number 7    Authorization - Number of Visits 16    Progress Note Due on Visit 20    PT Start Time 5573    PT Stop Time 1530    PT Time Calculation (min) 41 min    Equipment Utilized During Treatment Gait belt    Activity Tolerance Patient tolerated treatment well;No increased pain    Behavior During Therapy [facility] for tasks assessed/performed               Past Medical History:  Diagnosis Date   Abdominal muscle pain 09/08/2016   Abnormal Pap smear of cervix 2009   Abscess    history of multiple abscesses   Acute bilateral low back pain 02/13/2017   Acute blood loss anemia    Acute on chronic renal failure ([place]) 07/12/2012   Acute renal failure ([place]) 07/12/2012   Acute respiratory failure with hypoxia ([place]) 10/21/2019   Adjustment disorder with depressed mood 03/26/2017   AKI (acute kidney injury) ([place])    Anemia 10/21/2019   Anemia of chronic disease 2002   Anginal pain ([place])    Anxiety    Panic attacks   Bilateral lower extremity edema 05/13/2016   Bipolar disorder ([place])    Cataract    B/L cataract   Cellulitis 05/21/2014   right eye   CHF (congestive heart failure) ([place])    Chronic bronchitis ([place])    "get it q yr" (05/13/2013)   Chronic pain    Chronic pain of right knee 09/08/2016   Dehiscence of amputation stump ([place])    Depression    Edema  of lower extremity    Endocarditis 2002   subacute bacterial endocarditis.    Fall    Family history of anesthesia complication    "my mom has a hard time coming out from under"   Fibromyalgia    GERD (gastroesophageal reflux disease)    occ   Heart murmur    Herpes simplex type 1 infection 01/16/2018   History of blood transfusion    "just low blood count" (05/13/2013)   Hyperlipidemia    Hypertension    Hypothyroidism    Hypothyroidism, adult 03/21/2014   Hypoxia    Leukocytosis    Muscle spasm 12/16/2020   Necrosis ([place])    and ulceration   Necrotizing fasciitis s/p OR debridements 07/06/2012   Obesity    OSA on CPAP    does not wear all the time   Peripheral neuropathy    Pneumonia    Pyelonephritis 12/31/2020   Routine adult health maintenance 12/08/2016   Severe protein-calorie malnutrition ([place])    Status post below knee amputation, left ([place]) 04/11/2017   Type II diabetes mellitus (HCC)    Type  II   Vaginal irritation 03/14/2016   Weak pulse 07/21/2021   Wound dehiscence 09/04/2019   Past Surgical  History:  Procedure Laterality Date   A/V FISTULAGRAM Left 05/12/2021   Procedure: A/V FISTULAGRAM;  Surgeon: Broadus John, MD;  Location: [place] [facility];  Service: Cardiovascular;  Laterality: Left;   ABDOMINAL AORTOGRAM W/LOWER EXTREMITY Left 03/19/2019   Procedure: ABDOMINAL AORTOGRAM W/LOWER EXTREMITY;  Surgeon: Serafina Mitchell, MD;  Location: [facility];  Service: Cardiovascular;  Laterality: Left;   ABOVE KNEE LEG AMPUTATION Left 10/11/2019   wound dehisence    AIR/FLUID EXCHANGE Right 12/31/2019   Procedure: AIR/FLUID EXCHANGE;  Surgeon: Jalene Mullet, MD;  Location: [place];  Service: Ophthalmology;  Laterality: Right;   AMPUTATION Left 04/07/2017   Procedure: LEFT BELOW KNEE AMPUTATION;  Surgeon: Newt Minion, MD;  Location: [place];  Service: Orthopedics;  Laterality: Left;   AMPUTATION Left 10/11/2019   Procedure: LEFT ABOVE KNEE AMPUTATION;   Surgeon: Newt Minion, MD;  Location: [place];  Service: Orthopedics;  Laterality: Left;   AV FISTULA PLACEMENT Left 01/05/2021   Procedure: ARTERIOVENOUS (AV) FISTULA CREATION vs. GRAFT;  Surgeon: Angelia Mould, MD;  Location: [facility];  Service: Vascular;  Laterality: Left;   CATARACT EXTRACTION W/PHACO Right 02/11/2020   Procedure: CATARACT EXTRACTION PHACO AND INTRAOCULAR LENS PLACEMENT ([place]);  Surgeon: Jalene Mullet, MD;  Location: [place];  Service: Ophthalmology;  Laterality: Right;   EYE SURGERY     lazer   FISTULA SUPERFICIALIZATION Left 02/23/2021   Procedure: SUPERFICIALIZATION OF LEFT BRACHIOCEPHALIC FISTULA;  Surgeon: Angelia Mould, MD;  Location: [place];  Service: Vascular;  Laterality: Left;   GAS INSERTION Right 12/31/2019   Procedure: INSERTION OF GAS;  Surgeon: Jalene Mullet, MD;  Location: [place];  Service: Ophthalmology;  Laterality: Right;   INCISION AND DRAINAGE ABSCESS     multiple I&Ds   INCISION AND DRAINAGE ABSCESS Left 07/09/2012   Procedure: DRESSING CHANGE, THIGH WOUND;  Surgeon: Harl Bowie, MD;  Location: [place];  Service: General;  Laterality: Left;   INCISION AND DRAINAGE OF WOUND Left 07/07/2012   Procedure: IRRIGATION AND DEBRIDEMENT WOUND;  Surgeon: Harl Bowie, MD;  Location: [place];  Service: General;  Laterality: Left;   INCISION AND DRAINAGE PERIRECTAL ABSCESS Left 07/14/2012   Procedure: DEBRIDEMENT OF SKIN & SOFT TISSUE; DRESSING CHANGE UNDER ANESTHESIA;  Surgeon: Gayland Curry, MD,FACS;  Location: [place];  Service: General;  Laterality: Left;   INCISION AND DRAINAGE PERIRECTAL ABSCESS Left 07/16/2012   Procedure: I&D Left Thigh;  Surgeon: Gwenyth Ober, MD;  Location: [place];  Service: General;  Laterality: Left;   INCISION AND DRAINAGE PERIRECTAL ABSCESS N/A 01/05/2015   Procedure: IRRIGATION AND DEBRIDEMENT PERIRECTAL ABSCESS;  Surgeon: Donnie Mesa, MD;  Location: [place];  Service: General;  Laterality: N/A;   INSERTION OF  DIALYSIS CATHETER Right 01/05/2021   Procedure: INSERTION OF DIALYSIS CATHETER;  Surgeon: Angelia Mould, MD;  Location: [place];  Service: Vascular;  Laterality: Right;   IRRIGATION AND DEBRIDEMENT ABSCESS Left 07/06/2012   Procedure: IRRIGATION AND DEBRIDEMENT ABSCESS BUTTOCKS AND THIGH;  Surgeon: Shann Medal, MD;  Location: [place];  Service: General;  Laterality: Left;   IRRIGATION AND DEBRIDEMENT ABSCESS Left 08/10/2012   Procedure: IRRIGATION AND DEBRIDEMENT ABSCESS;  Surgeon: Madilyn Hook, DO;  Location: [place];  Service: General;  Laterality: Left;   PARS PLANA VITRECTOMY Right 12/31/2019   Procedure: PARS PLANA VITRECTOMY WITH 25 GAUGE;  Surgeon: Jalene Mullet, MD;  Location: [place];  Service: Ophthalmology;  Laterality: Right;   PERIPHERAL  VASCULAR INTERVENTION Left 05/12/2021   Procedure: PERIPHERAL VASCULAR INTERVENTION;  Surgeon: Broadus John, MD;  Location: [place] [facility];  Service: Cardiovascular;  Laterality: Left;  arm fistula   PHOTOCOAGULATION WITH LASER Right 12/31/2019   Procedure: PHOTOCOAGULATION WITH LASER;  Surgeon: Jalene Mullet, MD;  Location: [place];  Service: Ophthalmology;  Laterality: Right;   REPAIR OF COMPLEX TRACTION RETINAL DETACHMENT Right 12/31/2019   Procedure: REPAIR OF COMPLEX TRACTION RETINAL;  Surgeon: Jalene Mullet, MD;  Location: [place];  Service: Ophthalmology;  Laterality: Right;   STUMP REVISION Left 06/09/2017   Procedure: REVISION LEFT BELOW KNEE AMPUTATION;  Surgeon: Newt Minion, MD;  Location: [place];  Service: Orthopedics;  Laterality: Left;   STUMP REVISION Left 09/04/2019   Procedure: LEFT BELOW KNEE AMPUTATION REVISION;  Surgeon: Newt Minion, MD;  Location: [place];  Service: Orthopedics;  Laterality: Left;   STUMP REVISION Left 09/20/2019   Procedure: LEFT BELOW KNEE AMPUTATION REVISION;  Surgeon: Newt Minion, MD;  Location: [place];  Service: Orthopedics;  Laterality: Left;   TONSILLECTOMY  1994   Patient Active  Problem List   Diagnosis Date Noted   Urgency of urination 09/12/2021   Hidradenitis suppurativa 02/24/2021   Cough 05/26/2020   Chronic pain syndrome 01/16/2020   Hx of AKA (above knee amputation), left (HCC)    Blurry vision, right eye 10/08/2019   Tobacco use 03/01/2019   Phantom limb pain ([place]) 11/08/2018   Current moderate episode of major depressive disorder (HCC)    Hyperkalemia    Anemia of chronic disease    Bipolar affective disorder ([place])    Heart failure with preserved ejection fraction ([place]) 02/09/2017   Routine adult health maintenance 12/08/2016   End-stage renal disease on hemodialysis ([place]) 07/15/2016   OSA (obstructive sleep apnea) 05/13/2016   Morbid obesity due to excess calories ([place]) 11/27/2015   Subclinical hypothyroidism 04/25/2013   Controlled type 2 diabetes mellitus ([place]) 07/12/2012   Carpal tunnel syndrome, bilateral 01/25/2012   Diabetic polyneuropathy associated with type 2 diabetes mellitus ([place]) 07/04/2011   Anxiety and depression 06/02/2011   GERD 12/06/2007   Hyperlipidemia 08/29/2007   Hypertension associated with diabetes ([place]) 08/29/2007    REFERRING DIAG: N23.557 (ICD-10-CM) - Hx of AKA (above knee amputation), left (HCC)    THERAPY DIAG:  Other abnormalities of gait and mobility  Muscle weakness (generalized)  Unsteadiness on feet  Abnormal posture  PERTINENT HISTORY: ESRD, HTN, Chronic Pain, s/p left AKA    PRECAUTIONS: Fall, on dialysis T/Th/Sat. Fistula upper left arm   SUBJECTIVE: Tried using her cane Saturday. Went to cookout with it. "Scared as hell", but did it. Walked to the car, around [employer] out which was outside and from car to about 2 apartments for hers where she asked for her walker due to being "stressed out/frustrated/tried". Did have periodic HHA with use of cane. With discussed at intake pt prefers the single forearm crutch over the cane due to it provides more support. Also used regular grocery  cart yesterday vs  motorized cart with grocery shopping yesterday after dialysis. Is wearing 4 ply socks today. Did note increased circumduction with gait entering session today.   PAIN:  Are you having pain? No. Reports Medicated right now.       TODAY'S TREATMENT:    CURRENT PROSTHETIC WEAR ASSESSMENT: Donning prosthesis: Modified independence Doffing prosthesis: Modified independence Prosthetic wear tolerance:  Residual limb condition: continues with the white circular area at distal end of limb.  Continues to be itchy. Has been using cream that PCP gave her that is not helping to clear it up, on her 3rd tube. Pt advised to call Dr. Jess Barters office Comments: now needing to use socks to achieve a good fit.     GAIT: Gait pattern: step through pattern, decreased step length- Right, decreased stance time- Left, and circumduction- Left Distance walked: 500  x 1 in/outdoors, plus around clinic with session Assistive device utilized: Walker - 2 wheeled and prosthesis Level of assistance:  min guard to supervision Comments: with cues pt able to decrease circumduction on prosthesis with improved knee engagement on all surfaces of gait this session.     [facility] PT Assessment - 11/03/21 1521       Berg Balance Test   Sit to Stand Able to stand without using hands and stabilize independently    Standing Unsupported Able to stand safely 2 minutes    Sitting with Back Unsupported but Feet Supported on Floor or Stool Able to sit safely and securely 2 minutes    Stand to Sit Sits safely with minimal use of hands    Transfers Able to transfer safely, minor use of hands    Standing Unsupported with Eyes Closed Able to stand 10 seconds safely    Standing Unsupported with Feet Together Able to place feet together independently and stand 1 minute safely    From Standing, Reach Forward with Outstretched Arm Can reach confidently >25 cm (10")    From Standing Position, Pick up Object from Floor Able to pick up shoe safely  and easily    From Standing Position, Turn to Look Behind Over each Shoulder Looks behind from both sides and weight shifts well    Turn 360 Degrees Able to turn 360 degrees safely but slowly    Standing Unsupported, Alternately Place Feet on Step/Stool Able to complete >2 steps/needs minimal assist    Standing Unsupported, One Foot in Front Able to plae foot ahead of the other independently and hold 30 seconds    Standing on One Leg Able to lift leg independently and hold equal to or more than 3 seconds    Total Score 48    Berg comment: 48/56 moderate risk             PATIENT EDUCATION: Education details: continue with HEP; progress toward STGs checked today Person educated: Patient Education method: Explanation Education comprehension: verbalized understanding     HOME EXERCISE PROGRAM: Access Code: [email or web address] URL: [email or web address] Date: 09/10/2021 Prepared by: Cherly Anderson  Exercises - Supine Lower Trunk Rotation  - 2 x daily - 7 x weekly - 1 sets - 4 reps - 20 sec hold - Single Knee to Chest Stretch  - 2 x daily - 7 x weekly - 1 sets - 4 reps - 20 sec hold - Modified Thomas Stretch  - 2 x daily - 7 x weekly - 1 sets - 3 reps - 30 sec hold - Sit to Stand  - 2 x daily - 5 x weekly - 2 sets - 5 reps - Standing Hip Abduction with Counter Support  - 1 x daily - 5 x weekly - 2 sets - 10 reps - Tapping bottom of cabinet at sink  - 1 x daily - 5 x weekly - 2 sets - 10 reps - Standing Balance in Corner with Eyes Closed  - 2 x daily - 5 x weekly - 1 sets - 3 reps - 20 sec hold -  Head turns side to side and up/down  - 2 x daily - 5 x weekly - 1 sets - 3 reps - 20 sec hold - Semi-Tandem Corner Balance With Eyes Open  - 2 x daily - 5 x weekly - 1 sets - 2 reps - 20 sec hold        GOALS: Goals reviewed with patient? Yes   SHORT TERM GOALS: Target date: 11/08/2021   Pt will be independent with continued HEP for strengthening, balance and aquatic  program. Baseline: Has initial HEP, Will plan to start aquatic program soon Goal status: MET   2.  Pt will decrease TUG to <28 secs for improved balance and decreased fall risk. Baseline: 10/29/21: 26 seconds  Goal status: MET   3.  Pt will be independent with prosthetic management when receives new socket for improved function. Baseline: 11/03/21: met with new socket Goal status: MET   4.  Pt will be able to ambulate >400' on varied level surfaces (including simulated carpet) with walker while initiating knee flexion in prosthesis for improved mobility. Baseline: 11/03/21: 500 feet on varied surfaces (paved, grass, gravel)  Goal status: MET   5. Pt will improve BERG balance score to 46/56 in order to indicate dec fall risk.   Baseline: 11/03/21: 48/56 scored today  Goal status: MET  6. Pt will ambulate 150' with single loftstrand crutch vs quad tip cane at S level in order to indicate more independent household ambulation.   Baseline: 83' with crutch/cane at min A level ; 115' with [place] cane with CGA  Goal status: NEW         LONG TERM GOALS: Target date:  12/06/21   Pt will be independent with progressive HEP for strengthening, balance and aerobic activity to continue gains on own. Baseline: 10/01/21: has HEP, however has limited time to perform them at home due to daughters schedule/family members staying at her house so no room to do anything.  Goal status: ONGOING    2.  Pt will ambulate 200' with cane versus loftstrand crutch on unlevel short community distances at S level.  Baseline: 10/01/21: gait with cane has been initiated in prior sessions, not to goal level; 115' with [place] cane with CGA Goal status: progressing   3.  Pt will increase gait speed to 2.62 ft/sec w/ LRAD for improved mobility. Baseline: 10/01/21: 2.07 ft/sec or  0.63 m/s. Goal status: REVISED    4.  Pt will increase Berg to >/= 50/56 for improved balance and decreased fall risk. Baseline:  10/01/21: 42/56 scored today Goal status: REVISED    5.  Pt will negotiate 4 stairs w/ B crutches, up/down curb/ramp with single crutch vs cane at S level in order to indicate safe community mobility.   Baseline: min A at this time with RW  Goal Status: NEW    ASSESSMENT:   CLINICAL IMPRESSION:  Skilled session focused on progress toward STGs with goals checked met today. Will plan to check remaining STG at next session.      OBJECTIVE IMPAIRMENTS Abnormal gait, decreased activity tolerance, decreased balance, decreased knowledge of use of DME, decreased mobility, decreased strength, prosthetic dependency , and pain.    ACTIVITY LIMITATIONS cleaning and community activity.    PERSONAL FACTORS Time since onset of injury/illness/exacerbation and 3+ comorbidities:  DM2, obesity, and bipolar disorder, HTN, CHF, heart murmur, peripheral neuropathy, ESRD on dialysis  are also affecting patient's functional outcome.      REHAB POTENTIAL:  Good   CLINICAL DECISION MAKING: Evolving/moderate complexity   EVALUATION COMPLEXITY: Moderate         PLAN: PT FREQUENCY: 2x/week   PT DURATION: 8 weeks   PLANNED INTERVENTIONS: Therapeutic exercises, Therapeutic activity, Neuromuscular re-education, Balance training, Gait training, Patient/Family education, Joint mobilization, Stair training, Prosthetic training, DME instructions, Aquatic Therapy, Cryotherapy, Moist heat, and Manual therapy   PLAN FOR NEXT SESSION:  . Check last STG. Continue with forearm crutch with gait training on straight paths, working toward turns/obstacle negotiation; continue with activities to promote prosthetic knee hydraulics engagement, static and dynamic balance training with decreased UE support   Willow Ora, PTA, [place] [address] [place],  [id number] [phone] 11/03/21, 4:44 PM

## 2021-11-04 DIAGNOSIS — N186 End stage renal disease: Secondary | ICD-10-CM | POA: Diagnosis not present

## 2021-11-04 DIAGNOSIS — N2581 Secondary hyperparathyroidism of renal origin: Secondary | ICD-10-CM | POA: Diagnosis not present

## 2021-11-04 DIAGNOSIS — Z992 Dependence on renal dialysis: Secondary | ICD-10-CM | POA: Diagnosis not present

## 2021-11-04 DIAGNOSIS — L299 Pruritus, unspecified: Secondary | ICD-10-CM | POA: Diagnosis not present

## 2021-11-05 ENCOUNTER — Encounter: Payer: Self-pay | Admitting: Physical Therapy

## 2021-11-05 ENCOUNTER — Ambulatory Visit: Payer: Medicare HMO | Admitting: Physical Therapy

## 2021-11-05 DIAGNOSIS — R2689 Other abnormalities of gait and mobility: Secondary | ICD-10-CM | POA: Diagnosis not present

## 2021-11-05 DIAGNOSIS — R293 Abnormal posture: Secondary | ICD-10-CM

## 2021-11-05 DIAGNOSIS — R2681 Unsteadiness on feet: Secondary | ICD-10-CM | POA: Diagnosis not present

## 2021-11-05 DIAGNOSIS — M6281 Muscle weakness (generalized): Secondary | ICD-10-CM

## 2021-11-05 NOTE — Therapy (Signed)
OUTPATIENT PHYSICAL THERAPY TREATMENT NOTE  Patient Name: [name] [name] MRN: [id number] DOB:03-25-1976, 46 y.o., female Today's Date: 11/05/2021  PCP: Riesa Pope, MD REFERRING PROVIDER: Riesa Pope, *    PT End of Session - 11/05/21 1020     Visit Number 17    Number of Visits 33   updated to reflect recert from 9/73/53   Date for PT Re-Evaluation 12/06/21    Authorization Type [employer] medicare with medicaid secondary. new auth to be submitted with recert at next session after 10/08/21    Authorization Time Period 17 visits 08/09/21-10/08/21; 16 visits  5/22- 12/06/2021    Authorization - Visit Number 8    Authorization - Number of Visits 16    Progress Note Due on Visit 20    PT Start Time 1018    PT Stop Time 1100    PT Time Calculation (min) 42 min    Equipment Utilized During Treatment Gait belt    Activity Tolerance Patient tolerated treatment well;No increased pain    Behavior During Therapy [facility] for tasks assessed/performed               Past Medical History:  Diagnosis Date   Abdominal muscle pain 09/08/2016   Abnormal Pap smear of cervix 2009   Abscess    history of multiple abscesses   Acute bilateral low back pain 02/13/2017   Acute blood loss anemia    Acute on chronic renal failure ([place]) 07/12/2012   Acute renal failure ([place]) 07/12/2012   Acute respiratory failure with hypoxia ([place]) 10/21/2019   Adjustment disorder with depressed mood 03/26/2017   AKI (acute kidney injury) ([place])    Anemia 10/21/2019   Anemia of chronic disease 2002   Anginal pain ([place])    Anxiety    Panic attacks   Bilateral lower extremity edema 05/13/2016   Bipolar disorder ([place])    Cataract    B/L cataract   Cellulitis 05/21/2014   right eye   CHF (congestive heart failure) ([place])    Chronic bronchitis ([place])    "get it q yr" (05/13/2013)   Chronic pain    Chronic pain of right knee 09/08/2016   Dehiscence of amputation stump ([place])    Depression    Edema  of lower extremity    Endocarditis 2002   subacute bacterial endocarditis.    Fall    Family history of anesthesia complication    "my mom has a hard time coming out from under"   Fibromyalgia    GERD (gastroesophageal reflux disease)    occ   Heart murmur    Herpes simplex type 1 infection 01/16/2018   History of blood transfusion    "just low blood count" (05/13/2013)   Hyperlipidemia    Hypertension    Hypothyroidism    Hypothyroidism, adult 03/21/2014   Hypoxia    Leukocytosis    Muscle spasm 12/16/2020   Necrosis ([place])    and ulceration   Necrotizing fasciitis s/p OR debridements 07/06/2012   Obesity    OSA on CPAP    does not wear all the time   Peripheral neuropathy    Pneumonia    Pyelonephritis 12/31/2020   Routine adult health maintenance 12/08/2016   Severe protein-calorie malnutrition ([place])    Status post below knee amputation, left ([place]) 04/11/2017   Type II diabetes mellitus (HCC)    Type  II   Vaginal irritation 03/14/2016   Weak pulse 07/21/2021   Wound dehiscence 09/04/2019   Past Surgical  History:  Procedure Laterality Date   A/V FISTULAGRAM Left 05/12/2021   Procedure: A/V FISTULAGRAM;  Surgeon: Broadus John, MD;  Location: [place] [facility];  Service: Cardiovascular;  Laterality: Left;   ABDOMINAL AORTOGRAM W/LOWER EXTREMITY Left 03/19/2019   Procedure: ABDOMINAL AORTOGRAM W/LOWER EXTREMITY;  Surgeon: Serafina Mitchell, MD;  Location: [facility];  Service: Cardiovascular;  Laterality: Left;   ABOVE KNEE LEG AMPUTATION Left 10/11/2019   wound dehisence    AIR/FLUID EXCHANGE Right 12/31/2019   Procedure: AIR/FLUID EXCHANGE;  Surgeon: Jalene Mullet, MD;  Location: [place];  Service: Ophthalmology;  Laterality: Right;   AMPUTATION Left 04/07/2017   Procedure: LEFT BELOW KNEE AMPUTATION;  Surgeon: Newt Minion, MD;  Location: [place];  Service: Orthopedics;  Laterality: Left;   AMPUTATION Left 10/11/2019   Procedure: LEFT ABOVE KNEE AMPUTATION;   Surgeon: Newt Minion, MD;  Location: [place];  Service: Orthopedics;  Laterality: Left;   AV FISTULA PLACEMENT Left 01/05/2021   Procedure: ARTERIOVENOUS (AV) FISTULA CREATION vs. GRAFT;  Surgeon: Angelia Mould, MD;  Location: [facility];  Service: Vascular;  Laterality: Left;   CATARACT EXTRACTION W/PHACO Right 02/11/2020   Procedure: CATARACT EXTRACTION PHACO AND INTRAOCULAR LENS PLACEMENT ([place]);  Surgeon: Jalene Mullet, MD;  Location: [place];  Service: Ophthalmology;  Laterality: Right;   EYE SURGERY     lazer   FISTULA SUPERFICIALIZATION Left 02/23/2021   Procedure: SUPERFICIALIZATION OF LEFT BRACHIOCEPHALIC FISTULA;  Surgeon: Angelia Mould, MD;  Location: [place];  Service: Vascular;  Laterality: Left;   GAS INSERTION Right 12/31/2019   Procedure: INSERTION OF GAS;  Surgeon: Jalene Mullet, MD;  Location: [place];  Service: Ophthalmology;  Laterality: Right;   INCISION AND DRAINAGE ABSCESS     multiple I&Ds   INCISION AND DRAINAGE ABSCESS Left 07/09/2012   Procedure: DRESSING CHANGE, THIGH WOUND;  Surgeon: Harl Bowie, MD;  Location: [place];  Service: General;  Laterality: Left;   INCISION AND DRAINAGE OF WOUND Left 07/07/2012   Procedure: IRRIGATION AND DEBRIDEMENT WOUND;  Surgeon: Harl Bowie, MD;  Location: [place];  Service: General;  Laterality: Left;   INCISION AND DRAINAGE PERIRECTAL ABSCESS Left 07/14/2012   Procedure: DEBRIDEMENT OF SKIN & SOFT TISSUE; DRESSING CHANGE UNDER ANESTHESIA;  Surgeon: Gayland Curry, MD,FACS;  Location: [place];  Service: General;  Laterality: Left;   INCISION AND DRAINAGE PERIRECTAL ABSCESS Left 07/16/2012   Procedure: I&D Left Thigh;  Surgeon: Gwenyth Ober, MD;  Location: [place];  Service: General;  Laterality: Left;   INCISION AND DRAINAGE PERIRECTAL ABSCESS N/A 01/05/2015   Procedure: IRRIGATION AND DEBRIDEMENT PERIRECTAL ABSCESS;  Surgeon: Donnie Mesa, MD;  Location: [place];  Service: General;  Laterality: N/A;   INSERTION OF  DIALYSIS CATHETER Right 01/05/2021   Procedure: INSERTION OF DIALYSIS CATHETER;  Surgeon: Angelia Mould, MD;  Location: [place];  Service: Vascular;  Laterality: Right;   IRRIGATION AND DEBRIDEMENT ABSCESS Left 07/06/2012   Procedure: IRRIGATION AND DEBRIDEMENT ABSCESS BUTTOCKS AND THIGH;  Surgeon: Shann Medal, MD;  Location: [place];  Service: General;  Laterality: Left;   IRRIGATION AND DEBRIDEMENT ABSCESS Left 08/10/2012   Procedure: IRRIGATION AND DEBRIDEMENT ABSCESS;  Surgeon: Madilyn Hook, DO;  Location: [place];  Service: General;  Laterality: Left;   PARS PLANA VITRECTOMY Right 12/31/2019   Procedure: PARS PLANA VITRECTOMY WITH 25 GAUGE;  Surgeon: Jalene Mullet, MD;  Location: [place];  Service: Ophthalmology;  Laterality: Right;   PERIPHERAL  VASCULAR INTERVENTION Left 05/12/2021   Procedure: PERIPHERAL VASCULAR INTERVENTION;  Surgeon: Broadus John, MD;  Location: [place] [facility];  Service: Cardiovascular;  Laterality: Left;  arm fistula   PHOTOCOAGULATION WITH LASER Right 12/31/2019   Procedure: PHOTOCOAGULATION WITH LASER;  Surgeon: Jalene Mullet, MD;  Location: [place];  Service: Ophthalmology;  Laterality: Right;   REPAIR OF COMPLEX TRACTION RETINAL DETACHMENT Right 12/31/2019   Procedure: REPAIR OF COMPLEX TRACTION RETINAL;  Surgeon: Jalene Mullet, MD;  Location: [place];  Service: Ophthalmology;  Laterality: Right;   STUMP REVISION Left 06/09/2017   Procedure: REVISION LEFT BELOW KNEE AMPUTATION;  Surgeon: Newt Minion, MD;  Location: [place];  Service: Orthopedics;  Laterality: Left;   STUMP REVISION Left 09/04/2019   Procedure: LEFT BELOW KNEE AMPUTATION REVISION;  Surgeon: Newt Minion, MD;  Location: [place];  Service: Orthopedics;  Laterality: Left;   STUMP REVISION Left 09/20/2019   Procedure: LEFT BELOW KNEE AMPUTATION REVISION;  Surgeon: Newt Minion, MD;  Location: [place];  Service: Orthopedics;  Laterality: Left;   TONSILLECTOMY  1994   Patient Active  Problem List   Diagnosis Date Noted   Urgency of urination 09/12/2021   Hidradenitis suppurativa 02/24/2021   Cough 05/26/2020   Chronic pain syndrome 01/16/2020   Hx of AKA (above knee amputation), left (HCC)    Blurry vision, right eye 10/08/2019   Tobacco use 03/01/2019   Phantom limb pain ([place]) 11/08/2018   Current moderate episode of major depressive disorder (HCC)    Hyperkalemia    Anemia of chronic disease    Bipolar affective disorder ([place])    Heart failure with preserved ejection fraction ([place]) 02/09/2017   Routine adult health maintenance 12/08/2016   End-stage renal disease on hemodialysis ([place]) 07/15/2016   OSA (obstructive sleep apnea) 05/13/2016   Morbid obesity due to excess calories ([place]) 11/27/2015   Subclinical hypothyroidism 04/25/2013   Controlled type 2 diabetes mellitus ([place]) 07/12/2012   Carpal tunnel syndrome, bilateral 01/25/2012   Diabetic polyneuropathy associated with type 2 diabetes mellitus ([place]) 07/04/2011   Anxiety and depression 06/02/2011   GERD 12/06/2007   Hyperlipidemia 08/29/2007   Hypertension associated with diabetes ([place]) 08/29/2007    REFERRING DIAG: S34.196 (ICD-10-CM) - Hx of AKA (above knee amputation), left (HCC)    THERAPY DIAG:  Other abnormalities of gait and mobility  Muscle weakness (generalized)  Unsteadiness on feet  Abnormal posture  PERTINENT HISTORY: ESRD, HTN, Chronic Pain, s/p left AKA    PRECAUTIONS: Fall, on dialysis T/Th/Sat. Fistula upper left arm   SUBJECTIVE: No new falls. Does report wanting another copy of the LMN the PT wrote for her to get rail/s on her stairs at home.  PAIN:  Are you having pain? No. Reports Medicated right now.       TODAY'S TREATMENT:    CURRENT PROSTHETIC WEAR ASSESSMENT: Donning prosthesis: Modified independence Doffing prosthesis: Modified independence Prosthetic wear tolerance:  Residual limb condition: continues with the white circular area at distal end of limb.  Continues to be itchy. Has been using cream that PCP gave her that is not helping to clear it up, on her 3rd tube. Has not called Dr. Jess Barters office as yet, plans to call today. Comments: now needing to use socks to achieve a good fit.   STRENGTHENING   Scifit UE/LE's level 5.0  x 8 minutes with goal >/= 75 steps per minute for strengthening and activity tolerance.    GAIT: Gait pattern: step through pattern,  decreased step length- Right, decreased stance time- Left, and circumduction- Left Distance walked: 115 x1 with single forearm crutch, around clinic with walker Assistive device utilized: Walker - 2 wheeled, Crutches, and prosthesis Level of assistance:  min guard to supervision Comments:  cues for increased weight shifting onto right LE to allow prosthetic foot to clear more  PROSTHETIC TRAINING:  Activities to engage hydraulics of prosthetic knee - mini squats with no UE support for 2 sets of 10 reps. PTA guarding knee for safety with no buckling noted. Cues to keep 50/50 weight shifting, if not more on the prosthesis - sit<>stands with emphasis on increased prosthetic use with sitting down x 10 reps        PATIENT EDUCATION: Education details: continue with HEP; progress toward remaining STG.  Person educated: Patient Education method: Explanation Education comprehension: verbalized understanding     HOME EXERCISE PROGRAM: Access Code: [email or web address] URL: [email or web address] Date: 09/10/2021 Prepared by: Cherly Anderson  Exercises - Supine Lower Trunk Rotation  - 2 x daily - 7 x weekly - 1 sets - 4 reps - 20 sec hold - Single Knee to Chest Stretch  - 2 x daily - 7 x weekly - 1 sets - 4 reps - 20 sec hold - Modified Thomas Stretch  - 2 x daily - 7 x weekly - 1 sets - 3 reps - 30 sec hold - Sit to Stand  - 2 x daily - 5 x weekly - 2 sets - 5 reps - Standing Hip Abduction with Counter Support  - 1 x daily - 5 x weekly - 2 sets - 10 reps - Tapping bottom of  cabinet at sink  - 1 x daily - 5 x weekly - 2 sets - 10 reps - Standing Balance in Corner with Eyes Closed  - 2 x daily - 5 x weekly - 1 sets - 3 reps - 20 sec hold - Head turns side to side and up/down  - 2 x daily - 5 x weekly - 1 sets - 3 reps - 20 sec hold - Semi-Tandem Corner Balance With Eyes Open  - 2 x daily - 5 x weekly - 1 sets - 2 reps - 20 sec hold        GOALS: Goals reviewed with patient? Yes   SHORT TERM GOALS: Target date: 11/08/2021   Pt will be independent with continued HEP for strengthening, balance and aquatic program. Baseline: Has initial HEP, Will plan to start aquatic program soon Goal status: MET   2.  Pt will decrease TUG to <28 secs for improved balance and decreased fall risk. Baseline: 10/29/21: 26 seconds  Goal status: MET   3.  Pt will be independent with prosthetic management when receives new socket for improved function. Baseline: 11/03/21: met with new socket Goal status: MET   4.  Pt will be able to ambulate >400' on varied level surfaces (including simulated carpet) with walker while initiating knee flexion in prosthesis for improved mobility. Baseline: 11/03/21: 500 feet on varied surfaces (paved, grass, gravel)  Goal status: MET   5. Pt will improve BERG balance score to 46/56 in order to indicate dec fall risk.   Baseline: 11/03/21: 48/56 scored today  Goal status: MET  6. Pt will ambulate 150' with single loftstrand crutch vs quad tip cane at S level in order to indicate more independent household ambulation.   Baseline: 11/05/21: 115 feet with forearm crutch with min guard assist, improved from 115' with crutch  at min A level  Goal status: PARTIALLY MET         LONG TERM GOALS: Target date:  12/06/21   Pt will be independent with progressive HEP for strengthening, balance and aerobic activity to continue gains on own. Baseline: 10/01/21: has HEP, however has limited time to perform them at home due to daughters schedule/family members  staying at her house so no room to do anything.  Goal status: ONGOING    2.  Pt will ambulate 200' with cane versus loftstrand crutch on unlevel short community distances at S level.  Baseline: 10/01/21: gait with cane has been initiated in prior sessions, not to goal level; 115' with [place] cane with CGA Goal status: progressing   3.  Pt will increase gait speed to 2.62 ft/sec w/ LRAD for improved mobility. Baseline: 10/01/21: 2.07 ft/sec or  0.63 m/s. Goal status: REVISED    4.  Pt will increase Berg to >/= 50/56 for improved balance and decreased fall risk. Baseline: 10/01/21: 42/56 scored today Goal status: REVISED    5.  Pt will negotiate 4 stairs w/ B crutches, up/down curb/ramp with single crutch vs cane at S level in order to indicate safe community mobility.   Baseline: min A at this time with RW  Goal Status: NEW    ASSESSMENT:   CLINICAL IMPRESSION:  Skilled session focused on progress toward remaining STG with goal partially met. Also continued to address strengthening, activity tolerance and training on microprocessor use. No issues noted or reported. Unable to locate LMN in pt's chart. Have reached out to PT to see if a copy can be obtained. The pt is making progress and should benefit from continued  PT to progress toward unmet goals.    OBJECTIVE IMPAIRMENTS Abnormal gait, decreased activity tolerance, decreased balance, decreased knowledge of use of DME, decreased mobility, decreased strength, prosthetic dependency , and pain.    ACTIVITY LIMITATIONS cleaning and community activity.    PERSONAL FACTORS Time since onset of injury/illness/exacerbation and 3+ comorbidities:  DM2, obesity, and bipolar disorder, HTN, CHF, heart murmur, peripheral neuropathy, ESRD on dialysis  are also affecting patient's functional outcome.      REHAB POTENTIAL: Good   CLINICAL DECISION MAKING: Evolving/moderate complexity   EVALUATION COMPLEXITY: Moderate         PLAN: PT  FREQUENCY: 2x/week   PT DURATION: 8 weeks   PLANNED INTERVENTIONS: Therapeutic exercises, Therapeutic activity, Neuromuscular re-education, Balance training, Gait training, Patient/Family education, Joint mobilization, Stair training, Prosthetic training, DME instructions, Aquatic Therapy, Cryotherapy, Moist heat, and Manual therapy   PLAN FOR NEXT SESSION:  . Continue with forearm crutch with gait training on straight paths, working toward turns/obstacle negotiation; continue with activities to promote prosthetic knee hydraulics engagement, static and dynamic balance training with decreased UE support   Willow Ora, PTA, [facility] [address], [place], [place] [address] [phone] 11/05/21, 3:36 PM

## 2021-11-06 DIAGNOSIS — N186 End stage renal disease: Secondary | ICD-10-CM | POA: Diagnosis not present

## 2021-11-06 DIAGNOSIS — N2581 Secondary hyperparathyroidism of renal origin: Secondary | ICD-10-CM | POA: Diagnosis not present

## 2021-11-06 DIAGNOSIS — L299 Pruritus, unspecified: Secondary | ICD-10-CM | POA: Diagnosis not present

## 2021-11-06 DIAGNOSIS — Z992 Dependence on renal dialysis: Secondary | ICD-10-CM | POA: Diagnosis not present

## 2021-11-08 ENCOUNTER — Ambulatory Visit (INDEPENDENT_AMBULATORY_CARE_PROVIDER_SITE_OTHER): Payer: Medicare HMO | Admitting: Internal Medicine

## 2021-11-08 ENCOUNTER — Other Ambulatory Visit: Payer: Self-pay

## 2021-11-08 ENCOUNTER — Encounter: Payer: Self-pay | Admitting: Internal Medicine

## 2021-11-08 VITALS — BP 110/65 | HR 83 | Temp 98.3°F | Resp 32 | Ht 69.5 in | Wt 273.7 lb

## 2021-11-08 DIAGNOSIS — I152 Hypertension secondary to endocrine disorders: Secondary | ICD-10-CM

## 2021-11-08 DIAGNOSIS — E782 Mixed hyperlipidemia: Secondary | ICD-10-CM | POA: Diagnosis not present

## 2021-11-08 DIAGNOSIS — F17210 Nicotine dependence, cigarettes, uncomplicated: Secondary | ICD-10-CM

## 2021-11-08 DIAGNOSIS — Z992 Dependence on renal dialysis: Secondary | ICD-10-CM

## 2021-11-08 DIAGNOSIS — N186 End stage renal disease: Secondary | ICD-10-CM

## 2021-11-08 DIAGNOSIS — E1159 Type 2 diabetes mellitus with other circulatory complications: Secondary | ICD-10-CM | POA: Diagnosis not present

## 2021-11-08 DIAGNOSIS — E1122 Type 2 diabetes mellitus with diabetic chronic kidney disease: Secondary | ICD-10-CM

## 2021-11-08 DIAGNOSIS — Z794 Long term (current) use of insulin: Secondary | ICD-10-CM

## 2021-11-08 LAB — POCT GLYCOSYLATED HEMOGLOBIN (HGB A1C): Hemoglobin A1C: 9.6 % — AB (ref 4.0–5.6)

## 2021-11-08 LAB — GLUCOSE, CAPILLARY: Glucose-Capillary: 317 mg/dL — ABNORMAL HIGH (ref 70–99)

## 2021-11-08 MED ORDER — SEMAGLUTIDE (1 MG/DOSE) 4 MG/3ML ~~LOC~~ SOPN: 1.0000 mg | PEN_INJECTOR | SUBCUTANEOUS | 0 refills | Status: DC

## 2021-11-08 MED ORDER — EMPAGLIFLOZIN 10 MG PO TABS: 10.0000 mg | ORAL_TABLET | Freq: Every day | ORAL | 2 refills | Status: DC

## 2021-11-08 NOTE — Assessment & Plan Note (Signed)
Reports going to her sessions and tolerating HD well.

## 2021-11-08 NOTE — Progress Notes (Signed)
CC: routine clinic visit for DM and weight loss   HPI:  [name] is a 46 y.o. female with a PMHx stated below and presents today for stated above. Please see the Encounters tab for problem-based Assessment & Plan for additional details.   Past Medical History:  Diagnosis Date   Abdominal muscle pain 09/08/2016   Abnormal Pap smear of cervix 2009   Abscess    history of multiple abscesses   Acute bilateral low back pain 02/13/2017   Acute blood loss anemia    Acute on chronic renal failure ([place]) 07/12/2012   Acute renal failure ([place]) 07/12/2012   Acute respiratory failure with hypoxia ([place]) 10/21/2019   Adjustment disorder with depressed mood 03/26/2017   AKI (acute kidney injury) ([place])    Anemia 10/21/2019   Anemia of chronic disease 2002   Anginal pain (HCC)    Anxiety    Panic attacks   Bilateral lower extremity edema 05/13/2016   Bipolar disorder ([place])    Cataract    B/L cataract   Cellulitis 05/21/2014   right eye   CHF (congestive heart failure) ([place])    Chronic bronchitis ([place])    "get it q yr" (05/13/2013)   Chronic pain    Chronic pain of right knee 09/08/2016   Dehiscence of amputation stump ([place])    Depression    Edema of lower extremity    Endocarditis 2002   subacute bacterial endocarditis.    Fall    Family history of anesthesia complication    "my mom has a hard time coming out from [place]"   Fibromyalgia    GERD (gastroesophageal reflux disease)    occ   Heart murmur    Herpes simplex type 1 infection 01/16/2018   History of blood transfusion    "just low blood count" (05/13/2013)   Hyperlipidemia    Hypertension    Hypothyroidism    Hypothyroidism, adult 03/21/2014   Hypoxia    Leukocytosis    Muscle spasm 12/16/2020   Necrosis ([place])    and ulceration   Necrotizing fasciitis s/p OR debridements 07/06/2012   Obesity    OSA on CPAP    does not wear all the time   Peripheral neuropathy    Pneumonia    Pyelonephritis 12/31/2020    Routine adult health maintenance 12/08/2016   Severe protein-calorie malnutrition ([place])    Status post below knee amputation, left ([place]) 04/11/2017   Type II diabetes mellitus (HCC)    Type  II   Vaginal irritation 03/14/2016   Weak pulse 07/21/2021   Wound dehiscence 09/04/2019    Current Outpatient Medications on File Prior to Visit  Medication Sig Dispense Refill   allopurinol (ZYLOPRIM) 100 MG tablet Take 1 tablet by mouth 3 times a week. Take only after dialysis sessions. Do not take on non-dialysis days. 30 tablet 1   amitriptyline (ELAVIL) 100 MG tablet Take 100 mg by mouth at bedtime.     amLODipine (NORVASC) 10 MG tablet Take 1 tablet (10 mg total) by mouth daily.     atorvastatin (LIPITOR) 20 MG tablet TAKE 1 TABLET BY MOUTH ONCE DAILY (Patient taking differently: Take 20 mg by mouth daily.) 90 tablet 1   B Complex-C-Folic Acid (DIALYVITE 220) 0.8 MG TABS Take 0.8 mg by mouth daily.     bismuth subsalicylate (PEPTO BISMOL) 262 MG/15ML suspension Take 30 mLs by mouth every 6 (six) hours as needed for indigestion or diarrhea or loose stools.  blood glucose meter kit and supplies KIT ICD 10- E11.69. Based on patient and insurance preference. Use up to four times daily as directed. 1 each 0   buPROPion (WELLBUTRIN XL) 150 MG 24 hr tablet Take 1 tablet (150 mg total) by mouth every morning. 90 tablet 3   calcium acetate (PHOSLO) 667 MG capsule Take 1 capsule (667 mg total) by mouth 3 (three) times daily with meals. 90 capsule 1   cyclobenzaprine (FLEXERIL) 5 MG tablet Take 1 tablet by mouth daily as needed for muscle spasms. 30 tablet 0   diclofenac Sodium (VOLTAREN) 1 % GEL APPLY 1 GRAM TOPICALLY TO AFFECTED AREA 4 TIMES DAILY AS NEEDED 409 g 2   folic acid-vitamin b complex-vitamin c-selenium-zinc (DIALYVITE) 3 MG TABS tablet Take 1 tablet by mouth daily.     furosemide (LASIX) 20 MG tablet Take 1 tablet by mouth daily. Take on non-dialysis days Mon, Wed, Friday Adjust per kidney  docs 30 tablet 0   hydrOXYzine (ATARAX) 25 MG tablet TAKE 1/2 TABLET BY MOUTH EVERY SIX HOURS AS NEEDED FOR ANXIETY 40 tablet 0   Insulin Pen Needle (TRUEPLUS PEN NEEDLES) 31G X 5 MM MISC 1 each by Other route 3 (three) times daily. 100 each 5   ketoconazole (NIZORAL) 2 % cream Apply 1 application topically to affected area daily. 15 g 2   levothyroxine (SYNTHROID) 88 MCG tablet Take 88 mcg by mouth daily.     metoprolol succinate (TOPROL-XL) 25 MG 24 hr tablet TAKE 1 TABLET BY MOUTH EVERY DAY 90 tablet 1   mupirocin ointment (BACTROBAN) 2 % APPLY TO AFFECTED AREA(S) DAILY AS NEEDED 22 g 0   oxyCODONE-acetaminophen (PERCOCET/ROXICET) 5-325 MG tablet TAKE 1/2 TABLET BY MOUTH 2 TIMES DAILY AS NEEDED FOR SEVERE PAIN 30 tablet 0   sevelamer carbonate (RENVELA) 800 MG tablet Take 1,600 mg by mouth 3 (three) times daily with meals.     No current facility-administered medications on file prior to visit.    Family History  Problem Relation Age of Onset   Heart failure Mother    Diabetes Mother    Kidney disease Mother    Kidney disease Father    Diabetes Father    Diabetes Paternal Grandmother    Heart failure Paternal Grandmother    Other Other     Social History   Socioeconomic History   Marital status: Single    Spouse name: Not on file   Number of children: 0   Years of education: 11   Highest education level: Not on file  Occupational History   Occupation: disability  Tobacco Use   Smoking status: Every Day    Packs/day: 0.50    Years: 16.00    Total pack years: 8.00    Types: Cigarettes, E-cigarettes    Last attempt to quit: 07/22/2019    Years since quitting: 2.3   Smokeless tobacco: Never   Tobacco comments:    Hasn't had any in 3 days 2-4 times a week   Vaping Use   Vaping Use: Former   Devices: CBD oil  Substance and Sexual Activity   Alcohol use: Yes    Alcohol/week: 0.0 standard drinks of alcohol    Comment: social   Drug use: Yes    Types: Marijuana     Comment: 1 time a week   Sexual activity: Yes  Other Topics Concern   Not on file  Social History Narrative   Lives with mother currently, goddaughter and uncle in a  one story home.     On disability for diabetes, neuropathy, sleep apnea etc.  Disability since 2014.    Education: 11th grade.    Social Determinants of Health   Financial Resource Strain: Not on file  Food Insecurity: Not on file  Transportation Needs: Not on file  Physical Activity: Not on file  Stress: Not on file  Social Connections: Not on file  Intimate Partner Violence: Not on file   Review of Systems: ROS negative except for what is noted on the assessment and plan.  Vitals:   11/08/21 1313 11/08/21 1323  BP: (!) 150/80 110/65  Pulse: 89 83  Resp: (!) 32   Temp: 98.3 F (36.8 C)   TempSrc: Oral   SpO2: 100%   Weight: 273 lb 11.2 oz (124.1 kg)   Height: 5' 9.5" (1.765 m)    Physical Exam: Constitutional: alert, well-appearing, in NAD Cardiovascular: RRR, no m/r/g, non-edematous right LE Pulmonary/Chest: normal work of breathing on RA, LCTAB Abdominal: soft, non-tender to palpation, non-distended MSK: S/p L AKA Neurological: A&O x 3 and follows commands  Skin: warm and dry   Assessment & Plan:   See Encounters Tab for problem based charting.  Patient discussed with Dr. Tera Helper, MD  Internal Medicine Resident, PGY-1 [facility]

## 2021-11-08 NOTE — Patient Instructions (Signed)
Weight loss and diabetes Start taking Ozempic 1 mg weekly for 1 month and then we will follow up in 1 month  Start taking Jardiance 10 mg daily

## 2021-11-08 NOTE — Assessment & Plan Note (Signed)
BP 150/80 and repeat 110/65 on current regimen of metoprolol succinate 25 mg daily and amlodipine 10 mg daily. Good med compliance and tolerating meds well.   Continue course as above

## 2021-11-08 NOTE — Assessment & Plan Note (Addendum)
A1c up from 7.7 three months ago to 9.6 today. She reports good medication compliance to Victoza 3.0 mg. She reports no major changes in diet or exercise since last A1c check. She is interested in switching to Ozempic at this time for DM management as well as weight loss, given she has has no weight loss with Victoza over the last 3 months; weight up from 271 -> 273 over last 3 months. Pt is also interested in bariatric surgery, but is agreeable to trying Ozempic before proceeding with surgery for weight loss management. Additionally, she will need a second agent for DM control at this time -- she is agreeable to starting Jardiance given she tolerated it in the past.   Discontinue Victoza 3.0 mg daily and start Ozempic 1 mg weekly once she obtains medication Start Jardiance 10 mg daily Tele-health in 4 weeks to up-titrate to 2 mg Ozempic weekly if she is tolerating medication A1c in 3 months  Lifestyle modifications discussed

## 2021-11-08 NOTE — Assessment & Plan Note (Signed)
Last lipid profile in 202 with LDL 122. She is on Atorvastatin 20 mg daily. Reports good med compliance and tolerating mediation well.   Lipid profile today Continue statin

## 2021-11-09 DIAGNOSIS — Z992 Dependence on renal dialysis: Secondary | ICD-10-CM | POA: Diagnosis not present

## 2021-11-09 DIAGNOSIS — N186 End stage renal disease: Secondary | ICD-10-CM | POA: Diagnosis not present

## 2021-11-09 DIAGNOSIS — L299 Pruritus, unspecified: Secondary | ICD-10-CM | POA: Diagnosis not present

## 2021-11-09 DIAGNOSIS — N2581 Secondary hyperparathyroidism of renal origin: Secondary | ICD-10-CM | POA: Diagnosis not present

## 2021-11-09 LAB — LIPID PANEL
Chol/HDL Ratio: 4 ratio (ref 0.0–4.4)
Cholesterol, Total: 164 mg/dL (ref 100–199)
HDL: 41 mg/dL (ref >39)
LDL Chol Calc (NIH): 86 mg/dL (ref 0–99)
Triglycerides: 221 mg/dL — ABNORMAL HIGH (ref 0–149)
VLDL Cholesterol Cal: 37 mg/dL (ref 5–40)

## 2021-11-10 ENCOUNTER — Ambulatory Visit: Payer: Medicare HMO | Admitting: Physical Therapy

## 2021-11-10 ENCOUNTER — Encounter: Payer: Self-pay | Admitting: Physical Therapy

## 2021-11-10 DIAGNOSIS — M6281 Muscle weakness (generalized): Secondary | ICD-10-CM | POA: Diagnosis not present

## 2021-11-10 DIAGNOSIS — R2681 Unsteadiness on feet: Secondary | ICD-10-CM

## 2021-11-10 DIAGNOSIS — R2689 Other abnormalities of gait and mobility: Secondary | ICD-10-CM | POA: Diagnosis not present

## 2021-11-10 DIAGNOSIS — R293 Abnormal posture: Secondary | ICD-10-CM | POA: Diagnosis not present

## 2021-11-10 NOTE — Therapy (Signed)
OUTPATIENT PHYSICAL THERAPY TREATMENT NOTE  Patient Name: Iman Reinertsen MRN: [id number] DOB:April 10, 1976, 46 y.o., female Today's Date: 11/10/2021  PCP: Riesa Pope, MD REFERRING PROVIDER: Riesa Pope, *    PT End of Session - 11/10/21 1405     Visit Number 18    Number of Visits 33   updated to reflect recert from 08/15/69   Date for PT Re-Evaluation 12/06/21    Authorization Type humana medicare with medicaid secondary. JULY 1st primary insurance changes to [facility] Medicare    Authorization Time Period 17 visits 08/09/21-10/08/21; 16 visits  5/22- 12/06/2021    Authorization - Visit Number 9    Authorization - Number of Visits 16    Progress Note Due on Visit 20    PT Start Time 2458    PT Stop Time 1445    PT Time Calculation (min) 41 min    Equipment Utilized During Treatment Gait belt    Activity Tolerance Patient tolerated treatment well;No increased pain    Behavior During Therapy [facility] for tasks assessed/performed               Past Medical History:  Diagnosis Date   Abdominal muscle pain 09/08/2016   Abnormal Pap smear of cervix 2009   Abscess    history of multiple abscesses   Acute bilateral low back pain 02/13/2017   Acute blood loss anemia    Acute on chronic renal failure ([place]) 07/12/2012   Acute renal failure ([place]) 07/12/2012   Acute respiratory failure with hypoxia ([place]) 10/21/2019   Adjustment disorder with depressed mood 03/26/2017   AKI (acute kidney injury) ([place])    Anemia 10/21/2019   Anemia of chronic disease 2002   Anginal pain ([place])    Anxiety    Panic attacks   Bilateral lower extremity edema 05/13/2016   Bipolar disorder ([place])    Cataract    B/L cataract   Cellulitis 05/21/2014   right eye   CHF (congestive heart failure) ([place])    Chronic bronchitis ([place])    "get it q yr" (05/13/2013)   Chronic pain    Chronic pain of right knee 09/08/2016   Dehiscence of amputation stump ([place])    Depression    Edema of lower  extremity    Endocarditis 2002   subacute bacterial endocarditis.    Fall    Family history of anesthesia complication    "my mom has a hard time coming out from under"   Fibromyalgia    GERD (gastroesophageal reflux disease)    occ   Heart murmur    Herpes simplex type 1 infection 01/16/2018   History of blood transfusion    "just low blood count" (05/13/2013)   Hyperlipidemia    Hypertension    Hypothyroidism    Hypothyroidism, adult 03/21/2014   Hypoxia    Leukocytosis    Muscle spasm 12/16/2020   Necrosis ([place])    and ulceration   Necrotizing fasciitis s/p OR debridements 07/06/2012   Obesity    OSA on CPAP    does not wear all the time   Peripheral neuropathy    Pneumonia    Pyelonephritis 12/31/2020   Routine adult health maintenance 12/08/2016   Severe protein-calorie malnutrition ([place])    Status post below knee amputation, left ([place]) 04/11/2017   Type II diabetes mellitus (HCC)    Type  II   Vaginal irritation 03/14/2016   Weak pulse 07/21/2021   Wound dehiscence 09/04/2019   Past Surgical History:  Procedure Laterality  Date   A/V FISTULAGRAM Left 05/12/2021   Procedure: A/V FISTULAGRAM;  Surgeon: Broadus John, MD;  Location: [place] [facility];  Service: Cardiovascular;  Laterality: Left;   ABDOMINAL AORTOGRAM W/LOWER EXTREMITY Left 03/19/2019   Procedure: ABDOMINAL AORTOGRAM W/LOWER EXTREMITY;  Surgeon: Serafina Mitchell, MD;  Location: [place] [facility];  Service: Cardiovascular;  Laterality: Left;   ABOVE KNEE LEG AMPUTATION Left 10/11/2019   wound dehisence    AIR/FLUID EXCHANGE Right 12/31/2019   Procedure: AIR/FLUID EXCHANGE;  Surgeon: Jalene Mullet, MD;  Location: [place];  Service: Ophthalmology;  Laterality: Right;   AMPUTATION Left 04/07/2017   Procedure: LEFT BELOW KNEE AMPUTATION;  Surgeon: Newt Minion, MD;  Location: [place];  Service: Orthopedics;  Laterality: Left;   AMPUTATION Left 10/11/2019   Procedure: LEFT ABOVE KNEE AMPUTATION;   Surgeon: Newt Minion, MD;  Location: [place];  Service: Orthopedics;  Laterality: Left;   AV FISTULA PLACEMENT Left 01/05/2021   Procedure: ARTERIOVENOUS (AV) FISTULA CREATION vs. GRAFT;  Surgeon: Angelia Mould, MD;  [facility];  Service: Vascular;  Laterality: Left;   CATARACT EXTRACTION W/PHACO Right 02/11/2020   Procedure: CATARACT EXTRACTION PHACO AND INTRAOCULAR LENS PLACEMENT ([place]);  Surgeon: Jalene Mullet, MD;  Location: [place];  Service: Ophthalmology;  Laterality: Right;   EYE SURGERY     lazer   FISTULA SUPERFICIALIZATION Left 02/23/2021   Procedure: SUPERFICIALIZATION OF LEFT BRACHIOCEPHALIC FISTULA;  Surgeon: Angelia Mould, MD;  Location: [place];  Service: Vascular;  Laterality: Left;   GAS INSERTION Right 12/31/2019   Procedure: INSERTION OF GAS;  Surgeon: Jalene Mullet, MD;  Location: [place];  Service: Ophthalmology;  Laterality: Right;   INCISION AND DRAINAGE ABSCESS     multiple I&Ds   INCISION AND DRAINAGE ABSCESS Left 07/09/2012   Procedure: DRESSING CHANGE, THIGH WOUND;  Surgeon: Harl Bowie, MD;  Location: [place];  Service: General;  Laterality: Left;   INCISION AND DRAINAGE OF WOUND Left 07/07/2012   Procedure: IRRIGATION AND DEBRIDEMENT WOUND;  Surgeon: Harl Bowie, MD;  Location: [place];  Service: General;  Laterality: Left;   INCISION AND DRAINAGE PERIRECTAL ABSCESS Left 07/14/2012   Procedure: DEBRIDEMENT OF SKIN & SOFT TISSUE; DRESSING CHANGE UNDER ANESTHESIA;  Surgeon: Gayland Curry, MD,FACS;  Location: [place];  Service: General;  Laterality: Left;   INCISION AND DRAINAGE PERIRECTAL ABSCESS Left 07/16/2012   Procedure: I&D Left Thigh;  Surgeon: Gwenyth Ober, MD;  Location: [place];  Service: General;  Laterality: Left;   INCISION AND DRAINAGE PERIRECTAL ABSCESS N/A 01/05/2015   Procedure: IRRIGATION AND DEBRIDEMENT PERIRECTAL ABSCESS;  Surgeon: Donnie Mesa, MD;  Location: [facility] [place];  Service: General;  Laterality: N/A;   INSERTION OF  DIALYSIS CATHETER Right 01/05/2021   Procedure: INSERTION OF DIALYSIS CATHETER;  Surgeon: Angelia Mould, MD;  Location: [place];  Service: Vascular;  Laterality: Right;   IRRIGATION AND DEBRIDEMENT ABSCESS Left 07/06/2012   Procedure: IRRIGATION AND DEBRIDEMENT ABSCESS BUTTOCKS AND THIGH;  Surgeon: Shann Medal, MD;  Location: [place];  Service: General;  Laterality: Left;   IRRIGATION AND DEBRIDEMENT ABSCESS Left 08/10/2012   Procedure: IRRIGATION AND DEBRIDEMENT ABSCESS;  Surgeon: Madilyn Hook, DO;  Location: [place];  Service: General;  Laterality: Left;   PARS PLANA VITRECTOMY Right 12/31/2019   Procedure: PARS PLANA VITRECTOMY WITH 25 GAUGE;  Surgeon: Jalene Mullet, MD;  Location: [place];  Service: Ophthalmology;  Laterality: Right;   PERIPHERAL VASCULAR INTERVENTION Left 05/12/2021  Procedure: PERIPHERAL VASCULAR INTERVENTION;  Surgeon: Broadus John, MD;  Location: [facility] [facility];  Service: Cardiovascular;  Laterality: Left;  arm fistula   PHOTOCOAGULATION WITH LASER Right 12/31/2019   Procedure: PHOTOCOAGULATION WITH LASER;  Surgeon: Jalene Mullet, MD;  Location: [place];  Service: Ophthalmology;  Laterality: Right;   REPAIR OF COMPLEX TRACTION RETINAL DETACHMENT Right 12/31/2019   Procedure: REPAIR OF COMPLEX TRACTION RETINAL;  Surgeon: Jalene Mullet, MD;  Location: [facility];  Service: Ophthalmology;  Laterality: Right;   STUMP REVISION Left 06/09/2017   Procedure: REVISION LEFT BELOW KNEE AMPUTATION;  Surgeon: Newt Minion, MD;  Location: [place];  Service: Orthopedics;  Laterality: Left;   STUMP REVISION Left 09/04/2019   Procedure: LEFT BELOW KNEE AMPUTATION REVISION;  Surgeon: Newt Minion, MD;  Location: [place];  Service: Orthopedics;  Laterality: Left;   STUMP REVISION Left 09/20/2019   Procedure: LEFT BELOW KNEE AMPUTATION REVISION;  Surgeon: Newt Minion, MD;  Location: [place];  Service: Orthopedics;  Laterality: Left;   TONSILLECTOMY  1994   Patient Active  Problem List   Diagnosis Date Noted   Urgency of urination 09/12/2021   Hidradenitis suppurativa 02/24/2021   Cough 05/26/2020   Chronic pain syndrome 01/16/2020   Hx of AKA (above knee amputation), left (HCC)    Blurry vision, right eye 10/08/2019   Tobacco use 03/01/2019   Phantom limb pain ([place]) 11/08/2018   Current moderate episode of major depressive disorder (HCC)    Hyperkalemia    Anemia of chronic disease    Bipolar affective disorder ([place])    Heart failure with preserved ejection fraction ([place]) 02/09/2017   Healthcare maintenance 12/08/2016   End-stage renal disease on hemodialysis ([place]) 07/15/2016   OSA (obstructive sleep apnea) 05/13/2016   Morbid obesity due to excess calories ([place]) 11/27/2015   Subclinical hypothyroidism 04/25/2013   Type 2 diabetes mellitus ([place]) 07/12/2012   Carpal tunnel syndrome, bilateral 01/25/2012   Diabetic polyneuropathy associated with type 2 diabetes mellitus ([place]) 07/04/2011   Anxiety and depression 06/02/2011   GERD 12/06/2007   Hyperlipidemia 08/29/2007   Hypertension associated with diabetes ([place]) 08/29/2007    REFERRING DIAG: T24.580 (ICD-10-CM) - Hx of AKA (above knee amputation), left (HCC)    THERAPY DIAG:  Other abnormalities of gait and mobility  Muscle weakness (generalized)  Unsteadiness on feet  PERTINENT HISTORY: ESRD, HTN, Chronic Pain, s/p left AKA    PRECAUTIONS: Fall, on dialysis T/Th/Sat. Fistula upper left arm   SUBJECTIVE:  Reports landing too hard on her left buttock/hip with scooting down the stairs. Now having increased pain in this area. Wants to proceed with therapy and will ice when she gets home.   PAIN:  Are you having pain? Yes NPRS scale: 7/10 Pain location: left hip Pain orientation: Left  PAIN TYPE: acute Pain description: aching and tender   Aggravating factors: landing hard on prosthesis with coming off porch to come to therapy Relieving factors: has pain medication on board        TODAY'S TREATMENT:    CURRENT PROSTHETIC WEAR ASSESSMENT: Donning prosthesis: Modified independence Doffing prosthesis: Modified independence Prosthetic wear tolerance:  Residual limb condition: continues with the white circular area at distal end of limb. Continues to be itchy. Has been using cream that PCP gave her that is not helping to clear it up, on her 3rd tube. Has not called Dr. Jess Barters office as yet, plans to call today. Comments: now needing to use socks to achieve a  good fit.   STRENGTHENING   Scifit UE/LE's level 4.5  x 8 minutes with goal >/= 80 steps per minute for strengthening and activity tolerance.    GAIT: Gait pattern: step through pattern, decreased step length- Right, decreased stance time- Left, and circumduction- Left Distance walked: around clinic with session Assistive device utilized: Walker - 2 wheeled and prosthesis Level of assistance: SBA Comments:  cues for increased weight shifting onto right LE to allow prosthetic foot to clear more. Pt noted to be circumducting with gait with foot scuffing. Pt has enough socks on for comfortable fit. ? If prosthesis is too long. Will plan to check this at next session if circumduction continues.   PROSTHETIC TRAINING:  Activities to engage hydraulics of prosthetic knee - in parallel bars with 4 inch box and wedges on each side (placed blue foam foot/pelvic height checker under small wedge to increase the decline height)- stepping on box with sound side and prosthesis on declines working on riding the hydraulics down. Cues needed for weight shifting and pelvic positioning.  - mini squats with no UE support for 1 sets of 10 reps. PTA guarding knee for safety with no buckling noted. Cues to keep 50/50 weight shifting, if not more on the prosthesis - picking 5# kettle bell up from 4 inch box for 2 sets of 5 reps with emphasis on bil LE equal weight bearing, min guard assist with guarding of prosthetic knee for safety.  -  stairs with bil rails: working on riding the prosthetic knee hydraulics down the stairs with min guard assist  to min assist for 3 reps of 4 stairs. Cues on weight shifting and pelvic movement to further engage hydraulics of prosthesis        PATIENT EDUCATION: Education details: continue with HEP Person educated: Patient Education method: Explanation Education comprehension: verbalized understanding     HOME EXERCISE PROGRAM: Access Code: [email or web address] URL: [email or web address] Date: 09/10/2021 Prepared by: Cherly Anderson  Exercises - Supine Lower Trunk Rotation  - 2 x daily - 7 x weekly - 1 sets - 4 reps - 20 sec hold - Single Knee to Chest Stretch  - 2 x daily - 7 x weekly - 1 sets - 4 reps - 20 sec hold - Modified Thomas Stretch  - 2 x daily - 7 x weekly - 1 sets - 3 reps - 30 sec hold - Sit to Stand  - 2 x daily - 5 x weekly - 2 sets - 5 reps - Standing Hip Abduction with Counter Support  - 1 x daily - 5 x weekly - 2 sets - 10 reps - Tapping bottom of cabinet at sink  - 1 x daily - 5 x weekly - 2 sets - 10 reps - Standing Balance in Corner with Eyes Closed  - 2 x daily - 5 x weekly - 1 sets - 3 reps - 20 sec hold - Head turns side to side and up/down  - 2 x daily - 5 x weekly - 1 sets - 3 reps - 20 sec hold - Semi-Tandem Corner Balance With Eyes Open  - 2 x daily - 5 x weekly - 1 sets - 2 reps - 20 sec hold        GOALS: Goals reviewed with patient? Yes   SHORT TERM GOALS: Target date: 11/08/2021   Pt will be independent with continued HEP for strengthening, balance and aquatic program. Baseline: Has initial HEP, Will plan to start aquatic program soon  Goal status: MET   2.  Pt will decrease TUG to <28 secs for improved balance and decreased fall risk. Baseline: 10/29/21: 26 seconds  Goal status: MET   3.  Pt will be independent with prosthetic management when receives new socket for improved function. Baseline: 11/03/21: met with new socket Goal status:  MET   4.  Pt will be able to ambulate >400' on varied level surfaces (including simulated carpet) with walker while initiating knee flexion in prosthesis for improved mobility. Baseline: 11/03/21: 500 feet on varied surfaces (paved, grass, gravel)  Goal status: MET   5. Pt will improve BERG balance score to 46/56 in order to indicate dec fall risk.   Baseline: 11/03/21: 48/56 scored today  Goal status: MET  6. Pt will ambulate 150' with single loftstrand crutch vs quad tip cane at S level in order to indicate more independent household ambulation.   Baseline: 11/05/21: 115 feet with forearm crutch with min guard assist, improved from 115' with crutch at min A level  Goal status: PARTIALLY MET         LONG TERM GOALS: Target date:  12/06/21   Pt will be independent with progressive HEP for strengthening, balance and aerobic activity to continue gains on own. Baseline: 10/01/21: has HEP, however has limited time to perform them at home due to daughters schedule/family members staying at her house so no room to do anything.  Goal status: ONGOING    2.  Pt will ambulate 200' with cane versus loftstrand crutch on unlevel short community distances at S level.  Baseline: 10/01/21: gait with cane has been initiated in prior sessions, not to goal level; 115' with [place] cane with CGA Goal status: progressing   3.  Pt will increase gait speed to 2.62 ft/sec w/ LRAD for improved mobility. Baseline: 10/01/21: 2.07 ft/sec or  0.63 m/s. Goal status: REVISED    4.  Pt will increase Berg to >/= 50/56 for improved balance and decreased fall risk. Baseline: 10/01/21: 42/56 scored today Goal status: REVISED    5.  Pt will negotiate 4 stairs w/ B crutches, up/down curb/ramp with single crutch vs cane at S level in order to indicate safe community mobility.   Baseline: min A at this time with RW  Goal Status: NEW    ASSESSMENT:   CLINICAL IMPRESSION:  Skilled session focused on strengthening and  prosthetic training with emphasis on engaging prosthetic knee hydraulics. Pt reporting hip pain 5/10 after session. No issues noted or reported in session. The pt appears to be progressing and should benefit from continued PT to progress toward unmet goals.   OBJECTIVE IMPAIRMENTS Abnormal gait, decreased activity tolerance, decreased balance, decreased knowledge of use of DME, decreased mobility, decreased strength, prosthetic dependency , and pain.    ACTIVITY LIMITATIONS cleaning and community activity.    PERSONAL FACTORS Time since onset of injury/illness/exacerbation and 3+ comorbidities:  DM2, obesity, and bipolar disorder, HTN, CHF, heart murmur, peripheral neuropathy, ESRD on dialysis  are also affecting patient's functional outcome.      REHAB POTENTIAL: Good   CLINICAL DECISION MAKING: Evolving/moderate complexity   EVALUATION COMPLEXITY: Moderate         PLAN: PT FREQUENCY: 2x/week   PT DURATION: 8 weeks   PLANNED INTERVENTIONS: Therapeutic exercises, Therapeutic activity, Neuromuscular re-education, Balance training, Gait training, Patient/Family education, Joint mobilization, Stair training, Prosthetic training, DME instructions, Aquatic Therapy, Cryotherapy, Moist heat, and Manual therapy   PLAN FOR NEXT SESSION:  . Continue  with forearm crutch with gait training on straight paths, working toward turns/obstacle negotiation; continue with activities to promote prosthetic knee hydraulics engagement, static and dynamic balance training with decreased UE support   Willow Ora, PTA, [place] [address], [place], [place] [address] [phone] 11/10/21, 4:42 PM

## 2021-11-11 DIAGNOSIS — L299 Pruritus, unspecified: Secondary | ICD-10-CM | POA: Diagnosis not present

## 2021-11-11 DIAGNOSIS — N186 End stage renal disease: Secondary | ICD-10-CM | POA: Diagnosis not present

## 2021-11-11 DIAGNOSIS — N2581 Secondary hyperparathyroidism of renal origin: Secondary | ICD-10-CM | POA: Diagnosis not present

## 2021-11-11 DIAGNOSIS — Z992 Dependence on renal dialysis: Secondary | ICD-10-CM | POA: Diagnosis not present

## 2021-11-12 ENCOUNTER — Encounter: Payer: Self-pay | Admitting: Physical Therapy

## 2021-11-12 ENCOUNTER — Ambulatory Visit: Payer: Medicare HMO | Admitting: Physical Therapy

## 2021-11-12 DIAGNOSIS — R2681 Unsteadiness on feet: Secondary | ICD-10-CM | POA: Diagnosis not present

## 2021-11-12 DIAGNOSIS — R293 Abnormal posture: Secondary | ICD-10-CM | POA: Diagnosis not present

## 2021-11-12 DIAGNOSIS — M6281 Muscle weakness (generalized): Secondary | ICD-10-CM | POA: Diagnosis not present

## 2021-11-12 DIAGNOSIS — R2689 Other abnormalities of gait and mobility: Secondary | ICD-10-CM | POA: Diagnosis not present

## 2021-11-12 NOTE — Therapy (Signed)
OUTPATIENT PHYSICAL THERAPY TREATMENT NOTE  Patient Name: Terilyn Tsan MRN: [id number] DOB:1976/01/04, 46 y.o., female Today's Date: 11/12/2021  PCP: Belva Agee, MD REFERRING PROVIDER: Belva Agee, *    PT End of Session - 11/12/21 1104     Visit Number 19    Number of Visits 33   updated to reflect recert from 10/11/21   Date for PT Re-Evaluation 12/06/21    Authorization Type human[employer] medicare with medicaid secondary. JULY 1st primary insurance changes to [facility] Medicare    Authorization Time Period 17 visits 08/09/21-10/08/21; 16 visits  5/22- 12/06/2021    Authorization - Visit Number 10    Authorization - Number of Visits 16    Progress Note Due on Visit 20    PT Start Time 1102    PT Stop Time 1144    PT Time Calculation (min) 42 min    Equipment Utilized During Treatment Gait belt    Activity Tolerance Patient tolerated treatment well;No increased pain    Behavior During Therapy [facility] for tasks assessed/performed               Past Medical History:  Diagnosis Date   Abdominal muscle pain 09/08/2016   Abnormal Pap smear of cervix 2009   Abscess    history of multiple abscesses   Acute bilateral low back pain 02/13/2017   Acute blood loss anemia    Acute on chronic renal failure (HCC) 07/12/2012   Acute renal failure (HCC) 07/12/2012   Acute respiratory failure with hypoxia (HCC) 10/21/2019   Adjustment disorder with depressed mood 03/26/2017   AKI (acute kidney injury) (HCC)    Anemia 10/21/2019   Anemia of chronic disease 2002   Anginal pain (HCC)    Anxiety    Panic attacks   Bilateral lower extremity edema 05/13/2016   Bipolar disorder (HCC)    Cataract    B/L cataract   Cellulitis 05/21/2014   right eye   CHF (congestive heart failure) (HCC)    Chronic bronchitis (HCC)    "get it q yr" (05/13/2013)   Chronic pain    Chronic pain of right knee 09/08/2016   Dehiscence of amputation stump (HCC)    Depression    Edema of lower  extremity    Endocarditis 2002   subacute bacterial endocarditis.    Fall    Family history of anesthesia complication    "my mom has a hard time coming out from under"   Fibromyalgia    GERD (gastroesophageal reflux disease)    occ   Heart murmur    Herpes simplex type 1 infection 01/16/2018   History of blood transfusion    "just low blood count" (05/13/2013)   Hyperlipidemia    Hypertension    Hypothyroidism    Hypothyroidism, adult 03/21/2014   Hypoxia    Leukocytosis    Muscle spasm 12/16/2020   Necrosis (HCC)    and ulceration   Necrotizing fasciitis s/p OR debridements 07/06/2012   Obesity    OSA on CPAP    does not wear all the time   Peripheral neuropathy    Pneumonia    Pyelonephritis 12/31/2020   Routine adult health maintenance 12/08/2016   Severe protein-calorie malnutrition (HCC)    Status post below knee amputation, left (HCC) 04/11/2017   Type II diabetes mellitus (HCC)    Type  II   Vaginal irritation 03/14/2016   Weak pulse 07/21/2021   Wound dehiscence 09/04/2019   Past Surgical History:  Procedure Laterality  Date   A/V FISTULAGRAM Left 05/12/2021   Procedure: A/V FISTULAGRAM;  Surgeon: Victorino Sparrow, MD;  Location: [facility] INVASIVE CV LAB;  Service: Cardiovascular;  Laterality: Left;   ABDOMINAL AORTOGRAM W/LOWER EXTREMITY Left 03/19/2019   Procedure: ABDOMINAL AORTOGRAM W/LOWER EXTREMITY;  Surgeon: Nada Libman, MD;  Location: [facility] INVASIVE CV LAB;  Service: Cardiovascular;  Laterality: Left;   ABOVE KNEE LEG AMPUTATION Left 10/11/2019   wound dehisence    AIR/FLUID EXCHANGE Right 12/31/2019   Procedure: AIR/FLUID EXCHANGE;  Surgeon: Carmela Rima, MD;  Location: [facility] OR;  Service: Ophthalmology;  Laterality: Right;   AMPUTATION Left 04/07/2017   Procedure: LEFT BELOW KNEE AMPUTATION;  Surgeon: Nadara Mustard, MD;  Location: [facility] OR;  Service: Orthopedics;  Laterality: Left;   AMPUTATION Left 10/11/2019   Procedure: LEFT ABOVE KNEE AMPUTATION;   Surgeon: Nadara Mustard, MD;  Location: [facility] OR;  Service: Orthopedics;  Laterality: Left;   AV FISTULA PLACEMENT Left 01/05/2021   Procedure: ARTERIOVENOUS (AV) FISTULA CREATION vs. GRAFT;  Surgeon: Chuck Hint, MD;  Location: [facility] OR;  Service: Vascular;  Laterality: Left;   CATARACT EXTRACTION W/PHACO Right 02/11/2020   Procedure: CATARACT EXTRACTION PHACO AND INTRAOCULAR LENS PLACEMENT (IOC);  Surgeon: Carmela Rima, MD;  Location: [facility] OR;  Service: Ophthalmology;  Laterality: Right;   EYE SURGERY     lazer   FISTULA SUPERFICIALIZATION Left 02/23/2021   Procedure: SUPERFICIALIZATION OF LEFT BRACHIOCEPHALIC FISTULA;  Surgeon: Chuck Hint, MD;  Location: [facility] OR;  Service: Vascular;  Laterality: Left;   GAS INSERTION Right 12/31/2019   Procedure: INSERTION OF GAS;  Surgeon: Carmela Rima, MD;  Location: [facility] OR;  Service: Ophthalmology;  Laterality: Right;   INCISION AND DRAINAGE ABSCESS     multiple I&Ds   INCISION AND DRAINAGE ABSCESS Left 07/09/2012   Procedure: DRESSING CHANGE, THIGH WOUND;  Surgeon: Shelly Rubenstein, MD;  Location: [facility] OR;  Service: General;  Laterality: Left;   INCISION AND DRAINAGE OF WOUND Left 07/07/2012   Procedure: IRRIGATION AND DEBRIDEMENT WOUND;  Surgeon: Shelly Rubenstein, MD;  Location: [facility] OR;  Service: General;  Laterality: Left;   INCISION AND DRAINAGE PERIRECTAL ABSCESS Left 07/14/2012   Procedure: DEBRIDEMENT OF SKIN & SOFT TISSUE; DRESSING CHANGE UNDER ANESTHESIA;  Surgeon: Atilano Ina, MD,FACS;  Location: [facility] OR;  Service: General;  Laterality: Left;   INCISION AND DRAINAGE PERIRECTAL ABSCESS Left 07/16/2012   Procedure: I&D Left Thigh;  Surgeon: Cherylynn Ridges, MD;  Location: [facility] OR;  Service: General;  Laterality: Left;   INCISION AND DRAINAGE PERIRECTAL ABSCESS N/A 01/05/2015   Procedure: IRRIGATION AND DEBRIDEMENT PERIRECTAL ABSCESS;  Surgeon: Manus Rudd, MD;  Location: [facility] OR;  Service: General;  Laterality: N/A;   INSERTION OF  DIALYSIS CATHETER Right 01/05/2021   Procedure: INSERTION OF DIALYSIS CATHETER;  Surgeon: Chuck Hint, MD;  Location: [facility] OR;  Service: Vascular;  Laterality: Right;   IRRIGATION AND DEBRIDEMENT ABSCESS Left 07/06/2012   Procedure: IRRIGATION AND DEBRIDEMENT ABSCESS BUTTOCKS AND THIGH;  Surgeon: Kandis Cocking, MD;  Location: [facility] OR;  Service: General;  Laterality: Left;   IRRIGATION AND DEBRIDEMENT ABSCESS Left 08/10/2012   Procedure: IRRIGATION AND DEBRIDEMENT ABSCESS;  Surgeon: Lodema Pilot, DO;  Location: [facility] OR;  Service: General;  Laterality: Left;   PARS PLANA VITRECTOMY Right 12/31/2019   Procedure: PARS PLANA VITRECTOMY WITH 25 GAUGE;  Surgeon: Carmela Rima, MD;  Location: [facility] OR;  Service: Ophthalmology;  Laterality: Right;   PERIPHERAL VASCULAR INTERVENTION Left 05/12/2021  Procedure: PERIPHERAL VASCULAR INTERVENTION;  Surgeon: Victorino Sparrow, MD;  Location: [facility] INVASIVE CV LAB;  Service: Cardiovascular;  Laterality: Left;  arm fistula   PHOTOCOAGULATION WITH LASER Right 12/31/2019   Procedure: PHOTOCOAGULATION WITH LASER;  Surgeon: Carmela Rima, MD;  Location: [facility] OR;  Service: Ophthalmology;  Laterality: Right;   REPAIR OF COMPLEX TRACTION RETINAL DETACHMENT Right 12/31/2019   Procedure: REPAIR OF COMPLEX TRACTION RETINAL;  Surgeon: Carmela Rima, MD;  Location: [facility] OR;  Service: Ophthalmology;  Laterality: Right;   STUMP REVISION Left 06/09/2017   Procedure: REVISION LEFT BELOW KNEE AMPUTATION;  Surgeon: Nadara Mustard, MD;  Location: [facility] OR;  Service: Orthopedics;  Laterality: Left;   STUMP REVISION Left 09/04/2019   Procedure: LEFT BELOW KNEE AMPUTATION REVISION;  Surgeon: Nadara Mustard, MD;  Location: [facility] OR;  Service: Orthopedics;  Laterality: Left;   STUMP REVISION Left 09/20/2019   Procedure: LEFT BELOW KNEE AMPUTATION REVISION;  Surgeon: Nadara Mustard, MD;  Location: [facility] OR;  Service: Orthopedics;  Laterality: Left;   TONSILLECTOMY  1994   Patient Active  Problem List   Diagnosis Date Noted   Urgency of urination 09/12/2021   Hidradenitis suppurativa 02/24/2021   Cough 05/26/2020   Chronic pain syndrome 01/16/2020   Hx of AKA (above knee amputation), left (HCC)    Blurry vision, right eye 10/08/2019   Tobacco use 03/01/2019   Phantom limb pain (HCC) 11/08/2018   Current moderate episode of major depressive disorder (HCC)    Hyperkalemia    Anemia of chronic disease    Bipolar affective disorder (HCC)    Heart failure with preserved ejection fraction (HCC) 02/09/2017   Healthcare maintenance 12/08/2016   End-stage renal disease on hemodialysis (HCC) 07/15/2016   OSA (obstructive sleep apnea) 05/13/2016   Morbid obesity due to excess calories (HCC) 11/27/2015   Subclinical hypothyroidism 04/25/2013   Type 2 diabetes mellitus (HCC) 07/12/2012   Carpal tunnel syndrome, bilateral 01/25/2012   Diabetic polyneuropathy associated with type 2 diabetes mellitus (HCC) 07/04/2011   Anxiety and depression 06/02/2011   GERD 12/06/2007   Hyperlipidemia 08/29/2007   Hypertension associated with diabetes (HCC) 08/29/2007    REFERRING DIAG: W09.811 (ICD-10-CM) - Hx of AKA (above knee amputation), left (HCC)    THERAPY DIAG:  Other abnormalities of gait and mobility  Muscle weakness (generalized)  Unsteadiness on feet  PERTINENT HISTORY: ESRD, HTN, Chronic Pain, s/p left AKA    PRECAUTIONS: Fall, on dialysis T/Th/Sat. Fistula upper left arm   SUBJECTIVE:  Hip is feeling better today, no soreness or bruising noted per pt report. No falls. No pain today.   PAIN:  Are you having pain? No       TODAY'S TREATMENT:    CURRENT PROSTHETIC WEAR ASSESSMENT: Donning prosthesis: Modified independence Doffing prosthesis: Modified independence Prosthetic wear tolerance:  Residual limb condition: continues with the white circular area at distal end of limb. Continues to be itchy. Has been using cream that PCP gave her that is not helping to  clear it up, on her 3rd tube. Has not called Dr. Audrie Lia office as yet, reinforced she should call Dr. Lajoyce Corners as she continues to report she has not to date Comments: currently wearing 3 ply sock for secure fit. Checked pelvic height with pt slightly taller on sound side (~ 2 cm difference).   STRENGTHENING   Scifit UE/LE's level 4.5  x 8 minutes with goal >/= 80 steps per minute for strengthening and activity tolerance.    GAIT: Gait  pattern: step through pattern, decreased step length- Right, decreased stance time- Left, and circumduction- Left Distance walked: around clinic with session Assistive device utilized: Walker - 2 wheeled and prosthesis Level of assistance: SBA Comments: with cues pt able to decrease prosthetic circumduction with occasional toe drag noted.   PROSTHETIC TRAINING:  Activities to engage hydraulics of prosthetic knee - mini squats with no UE support for 1 sets of 10 reps. PTA guarding knee for safety with no buckling noted. Cues to keep 50/50 weight shifting, if not more on the prosthesis - picking # kettle bell up from yoga block for 3 sets of 5 reps with emphasis on bil LE equal weight bearing, min guard assist with guarding of prosthetic knee for safety.  - on ramp: block practice working on engaging and riding prosthetic knee hydraulics with descending using RW. Min guard assist for safety with cues on weight shifting to fully engage hydraulics. Pt with several episodes of not fully engaging hydraulics with too quick flexion moment noted.      PATIENT EDUCATION: Education details: continue with HEP Person educated: Patient Education method: Explanation Education comprehension: verbalized understanding     HOME EXERCISE PROGRAM: Access Code: [email or web address] URL: [email or web address] Date: 09/10/2021 Prepared by: Elmer Bales  Exercises - Supine Lower Trunk Rotation  - 2 x daily - 7 x weekly - 1 sets - 4 reps - 20 sec hold - Single Knee to Chest  Stretch  - 2 x daily - 7 x weekly - 1 sets - 4 reps - 20 sec hold - Modified Thomas Stretch  - 2 x daily - 7 x weekly - 1 sets - 3 reps - 30 sec hold - Sit to Stand  - 2 x daily - 5 x weekly - 2 sets - 5 reps - Standing Hip Abduction with Counter Support  - 1 x daily - 5 x weekly - 2 sets - 10 reps - Tapping bottom of cabinet at sink  - 1 x daily - 5 x weekly - 2 sets - 10 reps - Standing Balance in Corner with Eyes Closed  - 2 x daily - 5 x weekly - 1 sets - 3 reps - 20 sec hold - Head turns side to side and up/down  - 2 x daily - 5 x weekly - 1 sets - 3 reps - 20 sec hold - Semi-Tandem Corner Balance With Eyes Open  - 2 x daily - 5 x weekly - 1 sets - 2 reps - 20 sec hold        GOALS: Goals reviewed with patient? Yes   SHORT TERM GOALS: Target date: 11/08/2021   Pt will be independent with continued HEP for strengthening, balance and aquatic program. Baseline: Has initial HEP, Will plan to start aquatic program soon Goal status: MET   2.  Pt will decrease TUG to <28 secs for improved balance and decreased fall risk. Baseline: 10/29/21: 26 seconds  Goal status: MET   3.  Pt will be independent with prosthetic management when receives new socket for improved function. Baseline: 11/03/21: met with new socket Goal status: MET   4.  Pt will be able to ambulate >400' on varied level surfaces (including simulated carpet) with walker while initiating knee flexion in prosthesis for improved mobility. Baseline: 11/03/21: 500 feet on varied surfaces (paved, grass, gravel)  Goal status: MET   5. Pt will improve BERG balance score to 46/56 in order to indicate dec fall risk.   Baseline: 11/03/21:  48/56 scored today  Goal status: MET  6. Pt will ambulate 150' with single loftstrand crutch vs quad tip cane at S level in order to indicate more independent household ambulation.   Baseline: 11/05/21: 115 feet with forearm crutch with min guard assist, improved from 115' with crutch at min A  level  Goal status: PARTIALLY MET         LONG TERM GOALS: Target date:  12/06/21   Pt will be independent with progressive HEP for strengthening, balance and aerobic activity to continue gains on own. Baseline: 10/01/21: has HEP, however has limited time to perform them at home due to daughters schedule/family members staying at her house so no room to do anything.  Goal status: ONGOING    2.  Pt will ambulate 200' with cane versus loftstrand crutch on unlevel short community distances at S level.  Baseline: 10/01/21: gait with cane has been initiated in prior sessions, not to goal level; 115' with [place] cane with CGA Goal status: progressing   3.  Pt will increase gait speed to 2.62 ft/sec w/ LRAD for improved mobility. Baseline: 10/01/21: 2.07 ft/sec or  0.63 m/s. Goal status: REVISED    4.  Pt will increase Berg to >/= 50/56 for improved balance and decreased fall risk. Baseline: 10/01/21: 42/56 scored today Goal status: REVISED    5.  Pt will negotiate 4 stairs w/ B crutches, up/down curb/ramp with single crutch vs cane at S level in order to indicate safe community mobility.   Baseline: min A at this time with RW  Goal Status: NEW    ASSESSMENT:   CLINICAL IMPRESSION:  Skilled session focused on strengthening and prosthetic training with emphasis on engaging prosthetic knee hydraulics. No issues noted or reported. The pt is making steady progress and should benefit from continued PT to progress toward unmet goals.    OBJECTIVE IMPAIRMENTS Abnormal gait, decreased activity tolerance, decreased balance, decreased knowledge of use of DME, decreased mobility, decreased strength, prosthetic dependency , and pain.    ACTIVITY LIMITATIONS cleaning and community activity.    PERSONAL FACTORS Time since onset of injury/illness/exacerbation and 3+ comorbidities:  DM2, obesity, and bipolar disorder, HTN, CHF, heart murmur, peripheral neuropathy, ESRD on dialysis  are also affecting  patient's functional outcome.      REHAB POTENTIAL: Good   CLINICAL DECISION MAKING: Evolving/moderate complexity   EVALUATION COMPLEXITY: Moderate         PLAN: PT FREQUENCY: 2x/week   PT DURATION: 8 weeks   PLANNED INTERVENTIONS: Therapeutic exercises, Therapeutic activity, Neuromuscular re-education, Balance training, Gait training, Patient/Family education, Joint mobilization, Stair training, Prosthetic training, DME instructions, Aquatic Therapy, Cryotherapy, Moist heat, and Manual therapy   PLAN FOR NEXT SESSION:  . Continue with forearm crutch with gait training on straight paths, working toward turns/obstacle negotiation; continue with activities to promote prosthetic knee hydraulics engagement, static and dynamic balance training with decreased UE support   Sallyanne Kuster, PTA, [facility] [address] [place], [place] [address] [phone] 11/12/21, 4:45 PM

## 2021-11-13 DIAGNOSIS — Z992 Dependence on renal dialysis: Secondary | ICD-10-CM | POA: Diagnosis not present

## 2021-11-13 DIAGNOSIS — N186 End stage renal disease: Secondary | ICD-10-CM | POA: Diagnosis not present

## 2021-11-13 DIAGNOSIS — N2581 Secondary hyperparathyroidism of renal origin: Secondary | ICD-10-CM | POA: Diagnosis not present

## 2021-11-13 DIAGNOSIS — L299 Pruritus, unspecified: Secondary | ICD-10-CM | POA: Diagnosis not present

## 2021-11-15 ENCOUNTER — Other Ambulatory Visit: Payer: Self-pay | Admitting: Student

## 2021-11-16 DIAGNOSIS — L299 Pruritus, unspecified: Secondary | ICD-10-CM | POA: Diagnosis not present

## 2021-11-16 DIAGNOSIS — Z992 Dependence on renal dialysis: Secondary | ICD-10-CM | POA: Diagnosis not present

## 2021-11-16 DIAGNOSIS — N2581 Secondary hyperparathyroidism of renal origin: Secondary | ICD-10-CM | POA: Diagnosis not present

## 2021-11-16 DIAGNOSIS — N186 End stage renal disease: Secondary | ICD-10-CM | POA: Diagnosis not present

## 2021-11-17 ENCOUNTER — Encounter: Payer: Self-pay | Admitting: Physical Therapy

## 2021-11-17 ENCOUNTER — Ambulatory Visit: Payer: Medicare HMO | Admitting: Physical Therapy

## 2021-11-17 DIAGNOSIS — R2681 Unsteadiness on feet: Secondary | ICD-10-CM

## 2021-11-17 DIAGNOSIS — M6281 Muscle weakness (generalized): Secondary | ICD-10-CM | POA: Diagnosis not present

## 2021-11-17 DIAGNOSIS — R2689 Other abnormalities of gait and mobility: Secondary | ICD-10-CM

## 2021-11-17 DIAGNOSIS — R293 Abnormal posture: Secondary | ICD-10-CM | POA: Diagnosis not present

## 2021-11-17 NOTE — Therapy (Addendum)
OUTPATIENT PHYSICAL THERAPY TREATMENT NOTE  Patient Name: [name] [name] MRN: [id number] DOB:04-23-1976, 46 y.o., female Today's Date: 11/18/2021  PCP: Riesa Pope, MD REFERRING PROVIDER: Riesa Pope, *    PT End of Session - 11/17/21 1105     Visit Number 20    Number of Visits 33   updated to reflect recert from 10/28/35   Date for PT Re-Evaluation 12/06/21    Authorization Type human[employer] medicare with medicaid secondary. JULY 1st primary insurance changes to [facility] Medicare    Authorization Time Period 17 visits 08/09/21-10/08/21; 16 visits  5/22- 12/06/2021    Authorization - Visit Number 11    Authorization - Number of Visits 16    Progress Note Due on Visit 20    PT Start Time 1103    PT Stop Time 1145    PT Time Calculation (min) 42 min    Equipment Utilized During Treatment Gait belt    Activity Tolerance Patient tolerated treatment well;No increased pain    Behavior During Therapy [facility] for tasks assessed/performed               Past Medical History:  Diagnosis Date   Abdominal muscle pain 09/08/2016   Abnormal Pap smear of cervix 2009   Abscess    history of multiple abscesses   Acute bilateral low back pain 02/13/2017   Acute blood loss anemia    Acute on chronic renal failure ([place]) 07/12/2012   Acute renal failure ([place]) 07/12/2012   Acute respiratory failure with hypoxia ([place]) 10/21/2019   Adjustment disorder with depressed mood 03/26/2017   AKI (acute kidney injury) ([place])    Anemia 10/21/2019   Anemia of chronic disease 2002   Anginal pain ([place])    Anxiety    Panic attacks   Bilateral lower extremity edema 05/13/2016   Bipolar disorder ([place])    Cataract    B/L cataract   Cellulitis 05/21/2014   right eye   CHF (congestive heart failure) ([place])    Chronic bronchitis ([place])    "get it q yr" (05/13/2013)   Chronic pain    Chronic pain of right knee 09/08/2016   Dehiscence of amputation stump ([place])    Depression    Edema of lower  extremity    Endocarditis 2002   subacute bacterial endocarditis.    Fall    Family history of anesthesia complication    "my mom has a hard time coming out from under"   Fibromyalgia    GERD (gastroesophageal reflux disease)    occ   Heart murmur    Herpes simplex type 1 infection 01/16/2018   History of blood transfusion    "just low blood count" (05/13/2013)   Hyperlipidemia    Hypertension    Hypothyroidism    Hypothyroidism, adult 03/21/2014   Hypoxia    Leukocytosis    Muscle spasm 12/16/2020   Necrosis ([place])    and ulceration   Necrotizing fasciitis s/p OR debridements 07/06/2012   Obesity    OSA on CPAP    does not wear all the time   Peripheral neuropathy    Pneumonia    Pyelonephritis 12/31/2020   Routine adult health maintenance 12/08/2016   Severe protein-calorie malnutrition ([place])    Status post below knee amputation, left ([place]) 04/11/2017   Type II diabetes mellitus (HCC)    Type  II   Vaginal irritation 03/14/2016   Weak pulse 07/21/2021   Wound dehiscence 09/04/2019   Past Surgical History:  Procedure Laterality  Date   A/V FISTULAGRAM Left 05/12/2021   Procedure: A/V FISTULAGRAM;  Surgeon: Broadus John, MD;  Location: [place] [facility];  Service: Cardiovascular;  Laterality: Left;   ABDOMINAL AORTOGRAM W/LOWER EXTREMITY Left 03/19/2019   Procedure: ABDOMINAL AORTOGRAM W/LOWER EXTREMITY;  Surgeon: Serafina Mitchell, MD;  Location: [place] [facility];  Service: Cardiovascular;  Laterality: Left;   ABOVE KNEE LEG AMPUTATION Left 10/11/2019   wound dehisence    AIR/FLUID EXCHANGE Right 12/31/2019   Procedure: AIR/FLUID EXCHANGE;  Surgeon: [name] Mullet, MD;  Location: [place];  Service: Ophthalmology;  Laterality: Right;   AMPUTATION Left 04/07/2017   Procedure: LEFT BELOW KNEE AMPUTATION;  Surgeon: Newt Minion, MD;  Location: [place];  Service: Orthopedics;  Laterality: Left;   AMPUTATION Left 10/11/2019   Procedure: LEFT ABOVE KNEE AMPUTATION;   Surgeon: Newt Minion, MD;  Location: [place];  Service: Orthopedics;  Laterality: Left;   AV FISTULA PLACEMENT Left 01/05/2021   Procedure: ARTERIOVENOUS (AV) FISTULA CREATION vs. GRAFT;  Surgeon: Angelia Mould, MD;  [facility];  Service: Vascular;  Laterality: Left;   CATARACT EXTRACTION W/PHACO Right 02/11/2020   Procedure: CATARACT EXTRACTION PHACO AND INTRAOCULAR LENS PLACEMENT ([place]);  Surgeon: [name] Mullet, MD;  Location: [place];  Service: Ophthalmology;  Laterality: Right;   EYE SURGERY     lazer   FISTULA SUPERFICIALIZATION Left 02/23/2021   Procedure: SUPERFICIALIZATION OF LEFT BRACHIOCEPHALIC FISTULA;  Surgeon: Angelia Mould, MD;  Location: [place];  Service: Vascular;  Laterality: Left;   GAS INSERTION Right 12/31/2019   Procedure: INSERTION OF GAS;  Surgeon: [name] Mullet, MD;  Location: [place];  Service: Ophthalmology;  Laterality: Right;   INCISION AND DRAINAGE ABSCESS     multiple I&Ds   INCISION AND DRAINAGE ABSCESS Left 07/09/2012   Procedure: DRESSING CHANGE, THIGH WOUND;  Surgeon: Harl Bowie, MD;  Location: [place];  Service: General;  Laterality: Left;   INCISION AND DRAINAGE OF WOUND Left 07/07/2012   Procedure: IRRIGATION AND DEBRIDEMENT WOUND;  Surgeon: Harl Bowie, MD;  Location: [place];  Service: General;  Laterality: Left;   INCISION AND DRAINAGE PERIRECTAL ABSCESS Left 07/14/2012   Procedure: DEBRIDEMENT OF SKIN & SOFT TISSUE; DRESSING CHANGE UNDER ANESTHESIA;  Surgeon: Gayland Curry, MD,FACS;  Location: [place];  Service: General;  Laterality: Left;   INCISION AND DRAINAGE PERIRECTAL ABSCESS Left 07/16/2012   Procedure: I&D Left Thigh;  Surgeon: Gwenyth Ober, MD;  Location: [place];  Service: General;  Laterality: Left;   INCISION AND DRAINAGE PERIRECTAL ABSCESS N/A 01/05/2015   Procedure: IRRIGATION AND DEBRIDEMENT PERIRECTAL ABSCESS;  Surgeon: Donnie Mesa, MD;  Location: [place];  Service: General;  Laterality: N/A;   INSERTION OF  DIALYSIS CATHETER Right 01/05/2021   Procedure: INSERTION OF DIALYSIS CATHETER;  Surgeon: Angelia Mould, MD;  Location: [place];  Service: Vascular;  Laterality: Right;   IRRIGATION AND DEBRIDEMENT ABSCESS Left 07/06/2012   Procedure: IRRIGATION AND DEBRIDEMENT ABSCESS BUTTOCKS AND THIGH;  Surgeon: Shann Medal, MD;  Location: [place];  Service: General;  Laterality: Left;   IRRIGATION AND DEBRIDEMENT ABSCESS Left 08/10/2012   Procedure: IRRIGATION AND DEBRIDEMENT ABSCESS;  Surgeon: Madilyn Hook, DO;  Location: [place];  Service: General;  Laterality: Left;   PARS PLANA VITRECTOMY Right 12/31/2019   Procedure: PARS PLANA VITRECTOMY WITH 25 GAUGE;  Surgeon: [name] Mullet, MD;  Location: [place];  Service: Ophthalmology;  Laterality: Right;   PERIPHERAL VASCULAR INTERVENTION Left 05/12/2021  Procedure: PERIPHERAL VASCULAR INTERVENTION;  Surgeon: Broadus John, MD;  Location: [place] [facility];  Service: Cardiovascular;  Laterality: Left;  arm fistula   PHOTOCOAGULATION WITH LASER Right 12/31/2019   Procedure: PHOTOCOAGULATION WITH LASER;  Surgeon: [name] Mullet, MD;  Location: [place];  Service: Ophthalmology;  Laterality: Right;   REPAIR OF COMPLEX TRACTION RETINAL DETACHMENT Right 12/31/2019   Procedure: REPAIR OF COMPLEX TRACTION RETINAL;  Surgeon: [name] Mullet, MD;  Location: [place];  Service: Ophthalmology;  Laterality: Right;   STUMP REVISION Left 06/09/2017   Procedure: REVISION LEFT BELOW KNEE AMPUTATION;  Surgeon: Newt Minion, MD;  Location: [place];  Service: Orthopedics;  Laterality: Left;   STUMP REVISION Left 09/04/2019   Procedure: LEFT BELOW KNEE AMPUTATION REVISION;  Surgeon: Newt Minion, MD;  Location: [place];  Service: Orthopedics;  Laterality: Left;   STUMP REVISION Left 09/20/2019   Procedure: LEFT BELOW KNEE AMPUTATION REVISION;  Surgeon: Newt Minion, MD;  Location: [place];  Service: Orthopedics;  Laterality: Left;   TONSILLECTOMY  1994   Patient Active  Problem List   Diagnosis Date Noted   Urgency of urination 09/12/2021   Hidradenitis suppurativa 02/24/2021   Cough 05/26/2020   Chronic pain syndrome 01/16/2020   Hx of AKA (above knee amputation), left (HCC)    Blurry vision, right eye 10/08/2019   Tobacco use 03/01/2019   Phantom limb pain ([place]) 11/08/2018   Current moderate episode of major depressive disorder (HCC)    Hyperkalemia    Anemia of chronic disease    Bipolar affective disorder ([place])    Heart failure with preserved ejection fraction ([place]) 02/09/2017   Healthcare maintenance 12/08/2016   End-stage renal disease on hemodialysis ([place]) 07/15/2016   OSA (obstructive sleep apnea) 05/13/2016   Morbid obesity due to excess calories ([place]) 11/27/2015   Subclinical hypothyroidism 04/25/2013   Type 2 diabetes mellitus ([place]) 07/12/2012   Carpal tunnel syndrome, bilateral 01/25/2012   Diabetic polyneuropathy associated with type 2 diabetes mellitus ([place]) 07/04/2011   Anxiety and depression 06/02/2011   GERD 12/06/2007   Hyperlipidemia 08/29/2007   Hypertension associated with diabetes ([place]) 08/29/2007    REFERRING DIAG: D17.616 (ICD-10-CM) - Hx of AKA (above knee amputation), left (HCC)    THERAPY DIAG:  Other abnormalities of gait and mobility  Muscle weakness (generalized)  Unsteadiness on feet  PERTINENT HISTORY: ESRD, HTN, Chronic Pain, s/p left AKA    PRECAUTIONS: Fall, on dialysis T/Th/Sat. Fistula upper left arm   SUBJECTIVE:  No new complaints. No falls.   PAIN:  Are you having pain? No- medicated today      TODAY'S TREATMENT:    CURRENT PROSTHETIC WEAR ASSESSMENT: Donning prosthesis: Modified independence Doffing prosthesis: Modified independence Prosthetic wear tolerance:  Residual limb condition: continues with the white circular area at distal end of limb. Continues to be itchy. Has been using cream that PCP gave her that is not helping to clear it up, on her 3rd tube. Still has not called Dr.  Jess Barters office as yet, reinforced she should call Dr. Sharol Given. Comments: currently wearing 3 ply sock for secure fit. Checked pelvic height with pt slightly taller on sound side (~ 2 cm difference).   STRENGTHENING   Scifit UE/LE's level 5.0  x 8 minutes with goal >/= 80 steps per minute for strengthening and activity tolerance.    GAIT: Gait pattern: step through pattern, decreased step length- Right, decreased stance time- Left, and circumduction- Left Distance walked: around clinic with session  Assistive device utilized: Environmental consultant - 2 wheeled and prosthesis Level of assistance: SBA Comments: pt with decreased circumduction noted with gait this session with good engagement of prosthetic knee with swing phase.   PROSTHETIC TRAINING:  Activities to engage hydraulics of prosthetic knee - mini squats with no UE support for 1 sets of 10 reps. PTA guarding knee for safety with no buckling noted. Cues to keep 50/50 weight shifting, if not more on the prosthesis. Pt able to go lower this session  than with prior sessions.  - picking up 10# crate for 3 sets of 5 reps with emphasis on bil LE equal weight bearing, min guard assist with guarding of prosthetic knee for safety.   Balance/transfer training with single forearm crutches - weaving around 3 cones with 180* turns at each end with up to min assist at times for balance. Cues for crutch placement, base of support and for prosthetic knee flexion with swing phase as pt tended to "stiff leg" it at times with weaving/turns. No overt balance loss noted or knee buckling noted - practiced best way to sit<>stand with single crutch-       PATIENT EDUCATION: Education details: continue with HEP Person educated: Patient Education method: Explanation Education comprehension: verbalized understanding     HOME EXERCISE PROGRAM: Access Code: [email or web address] URL: [email or web address] Date: 09/10/2021 Prepared by: Cherly Anderson  Exercises -  Supine Lower Trunk Rotation  - 2 x daily - 7 x weekly - 1 sets - 4 reps - 20 sec hold - Single Knee to Chest Stretch  - 2 x daily - 7 x weekly - 1 sets - 4 reps - 20 sec hold - Modified Thomas Stretch  - 2 x daily - 7 x weekly - 1 sets - 3 reps - 30 sec hold - Sit to Stand  - 2 x daily - 5 x weekly - 2 sets - 5 reps - Standing Hip Abduction with Counter Support  - 1 x daily - 5 x weekly - 2 sets - 10 reps - Tapping bottom of cabinet at sink  - 1 x daily - 5 x weekly - 2 sets - 10 reps - Standing Balance in Corner with Eyes Closed  - 2 x daily - 5 x weekly - 1 sets - 3 reps - 20 sec hold - Head turns side to side and up/down  - 2 x daily - 5 x weekly - 1 sets - 3 reps - 20 sec hold - Semi-Tandem Corner Balance With Eyes Open  - 2 x daily - 5 x weekly - 1 sets - 2 reps - 20 sec hold        GOALS: Goals reviewed with patient? Yes   SHORT TERM GOALS: Target date: 11/08/2021   Pt will be independent with continued HEP for strengthening, balance and aquatic program. Baseline: Has initial HEP, Will plan to start aquatic program soon Goal status: MET   2.  Pt will decrease TUG to <28 secs for improved balance and decreased fall risk. Baseline: 10/29/21: 26 seconds  Goal status: MET   3.  Pt will be independent with prosthetic management when receives new socket for improved function. Baseline: 11/03/21: met with new socket Goal status: MET   4.  Pt will be able to ambulate >400' on varied level surfaces (including simulated carpet) with walker while initiating knee flexion in prosthesis for improved mobility. Baseline: 11/03/21: 500 feet on varied surfaces (paved, grass, gravel)  Goal status: MET   5.  Pt will improve BERG balance score to 46/56 in order to indicate dec fall risk.   Baseline: 11/03/21: 48/56 scored today  Goal status: MET  6. Pt will ambulate 150' with single loftstrand crutch vs quad tip cane at S level in order to indicate more independent household ambulation.   Baseline:  11/05/21: 115 feet with forearm crutch with min guard assist, improved from 115' with crutch at min A level  Goal status: PARTIALLY MET         LONG TERM GOALS: Target date:  12/06/21   Pt will be independent with progressive HEP for strengthening, balance and aerobic activity to continue gains on own. Baseline: 10/01/21: has HEP, however has limited time to perform them at home due to daughters schedule/family members staying at her house so no room to do anything.  Goal status: ONGOING    2.  Pt will ambulate 200' with cane versus loftstrand crutch on unlevel short community distances at S level.  Baseline: 10/01/21: gait with cane has been initiated in prior sessions, not to goal level; 115' with [place] cane with CGA Goal status: progressing   3.  Pt will increase gait speed to 2.62 ft/sec w/ LRAD for improved mobility. Baseline: 10/01/21: 2.07 ft/sec or  0.63 m/s. Goal status: REVISED    4.  Pt will increase Berg to >/= 50/56 for improved balance and decreased fall risk. Baseline: 10/01/21: 42/56 scored today Goal status: REVISED    5.  Pt will negotiate 4 stairs w/ B crutches, up/down curb/ramp with single crutch vs cane at S level in order to indicate safe community mobility.   Baseline: min A at this time with RW  Goal Status: NEW    ASSESSMENT:   CLINICAL IMPRESSION:  Skilled session focused on strengthening and prosthetic training with emphasis on engaging prosthetic knee hydraulics and gait/obstacles with single loft strand crutch/prosthesis. No issues noted or reported. The pt is making steady progress and should benefit from continued PT to progress toward unmet goals.    OBJECTIVE IMPAIRMENTS Abnormal gait, decreased activity tolerance, decreased balance, decreased knowledge of use of DME, decreased mobility, decreased strength, prosthetic dependency , and pain.    ACTIVITY LIMITATIONS cleaning and community activity.    PERSONAL FACTORS Time since onset of  injury/illness/exacerbation and 3+ comorbidities:  DM2, obesity, and bipolar disorder, HTN, CHF, heart murmur, peripheral neuropathy, ESRD on dialysis  are also affecting patient's functional outcome.      REHAB POTENTIAL: Good   CLINICAL DECISION MAKING: Evolving/moderate complexity   EVALUATION COMPLEXITY: Moderate         PLAN: PT FREQUENCY: 2x/week   PT DURATION: 8 weeks   PLANNED INTERVENTIONS: Therapeutic exercises, Therapeutic activity, Neuromuscular re-education, Balance training, Gait training, Patient/Family education, Joint mobilization, Stair training, Prosthetic training, DME instructions, Aquatic Therapy, Cryotherapy, Moist heat, and Manual therapy   PLAN FOR NEXT SESSION:  . continue with activities to promote prosthetic knee hydraulics engagement, static and dynamic balance training with decreased UE support. Continue with forearm crutch with gait training on straight paths for distance, and turns/stepping around/over obstacles   Willow Ora, PTA, [facility] [address] [place], [place] [address] [phone] 11/18/21, 3:24 PM   Agree with above plan of care.  Kerrie Pleasure, PT 11/25/2021, 7:57 AM

## 2021-11-18 DIAGNOSIS — Z992 Dependence on renal dialysis: Secondary | ICD-10-CM | POA: Diagnosis not present

## 2021-11-18 DIAGNOSIS — L299 Pruritus, unspecified: Secondary | ICD-10-CM | POA: Diagnosis not present

## 2021-11-18 DIAGNOSIS — N186 End stage renal disease: Secondary | ICD-10-CM | POA: Diagnosis not present

## 2021-11-18 DIAGNOSIS — N2581 Secondary hyperparathyroidism of renal origin: Secondary | ICD-10-CM | POA: Diagnosis not present

## 2021-11-19 ENCOUNTER — Ambulatory Visit: Payer: Medicare HMO | Admitting: Physical Therapy

## 2021-11-19 DIAGNOSIS — R2681 Unsteadiness on feet: Secondary | ICD-10-CM | POA: Diagnosis not present

## 2021-11-19 DIAGNOSIS — N186 End stage renal disease: Secondary | ICD-10-CM | POA: Diagnosis not present

## 2021-11-19 DIAGNOSIS — Z992 Dependence on renal dialysis: Secondary | ICD-10-CM | POA: Diagnosis not present

## 2021-11-19 DIAGNOSIS — R293 Abnormal posture: Secondary | ICD-10-CM | POA: Diagnosis not present

## 2021-11-19 DIAGNOSIS — M6281 Muscle weakness (generalized): Secondary | ICD-10-CM

## 2021-11-19 DIAGNOSIS — R2689 Other abnormalities of gait and mobility: Secondary | ICD-10-CM

## 2021-11-19 DIAGNOSIS — E1122 Type 2 diabetes mellitus with diabetic chronic kidney disease: Secondary | ICD-10-CM | POA: Diagnosis not present

## 2021-11-19 NOTE — Therapy (Signed)
OUTPATIENT PHYSICAL THERAPY TREATMENT NOTE  Patient Name: [name] [name] MRN: [id number] DOB:09-13-1975, 46 y.o., female Today's Date: 11/19/2021  PCP: Riesa Pope, MD REFERRING PROVIDER: Riesa Pope, *    PT End of Session - 11/19/21 [phone]     Visit Number 21    Number of Visits 33   updated to reflect recert from 6/31/49   Date for PT Re-Evaluation 12/06/21    Authorization Type human[employer] medicare with medicaid secondary. JULY 1st primary insurance changes to [facility] Medicare    Authorization Time Period 17 visits 08/09/21-10/08/21; 16 visits  5/22- 12/06/2021    Authorization - Visit Number 11    Authorization - Number of Visits 16    Progress Note Due on Visit 20    PT Start Time 0935   Pt arrived late   PT Stop Time 1015    PT Time Calculation (min) 40 min    Equipment Utilized During Treatment Gait belt    Activity Tolerance Patient tolerated treatment well;No increased pain    Behavior During Therapy [facility] for tasks assessed/performed               Past Medical History:  Diagnosis Date   Abdominal muscle pain 09/08/2016   Abnormal Pap smear of cervix 2009   Abscess    history of multiple abscesses   Acute bilateral low back pain 02/13/2017   Acute blood loss anemia    Acute on chronic renal failure ([place]) 07/12/2012   Acute renal failure ([place]) 07/12/2012   Acute respiratory failure with hypoxia ([place]) 10/21/2019   Adjustment disorder with depressed mood 03/26/2017   AKI (acute kidney injury) ([place])    Anemia 10/21/2019   Anemia of chronic disease 2002   Anginal pain ([place])    Anxiety    Panic attacks   Bilateral lower extremity edema 05/13/2016   Bipolar disorder ([place])    Cataract    B/L cataract   Cellulitis 05/21/2014   right eye   CHF (congestive heart failure) ([place])    Chronic bronchitis ([place])    "get it q yr" (05/13/2013)   Chronic pain    Chronic pain of right knee 09/08/2016   Dehiscence of amputation stump ([place])    Depression     Edema of lower extremity    Endocarditis 2002   subacute bacterial endocarditis.    Fall    Family history of anesthesia complication    "my mom has a hard time coming out from under"   Fibromyalgia    GERD (gastroesophageal reflux disease)    occ   Heart murmur    Herpes simplex type 1 infection 01/16/2018   History of blood transfusion    "just low blood count" (05/13/2013)   Hyperlipidemia    Hypertension    Hypothyroidism    Hypothyroidism, adult 03/21/2014   Hypoxia    Leukocytosis    Muscle spasm 12/16/2020   Necrosis ([place])    and ulceration   Necrotizing fasciitis s/p OR debridements 07/06/2012   Obesity    OSA on CPAP    does not wear all the time   Peripheral neuropathy    Pneumonia    Pyelonephritis 12/31/2020   Routine adult health maintenance 12/08/2016   Severe protein-calorie malnutrition ([place])    Status post below knee amputation, left ([place]) 04/11/2017   Type II diabetes mellitus (HCC)    Type  II   Vaginal irritation 03/14/2016   Weak pulse 07/21/2021   Wound dehiscence 09/04/2019   Past Surgical  History:  Procedure Laterality Date   A/V FISTULAGRAM Left 05/12/2021   Procedure: A/V FISTULAGRAM;  Surgeon: Broadus John, MD;  Location: [place] [facility];  Service: Cardiovascular;  Laterality: Left;   ABDOMINAL AORTOGRAM W/LOWER EXTREMITY Left 03/19/2019   Procedure: ABDOMINAL AORTOGRAM W/LOWER EXTREMITY;  Surgeon: Serafina Mitchell, MD;  Location: [place] [facility];  Service: Cardiovascular;  Laterality: Left;   ABOVE KNEE LEG AMPUTATION Left 10/11/2019   wound dehisence    AIR/FLUID EXCHANGE Right 12/31/2019   Procedure: AIR/FLUID EXCHANGE;  Surgeon: Jalene Mullet, MD;  Location: [place];  Service: Ophthalmology;  Laterality: Right;   AMPUTATION Left 04/07/2017   Procedure: LEFT BELOW KNEE AMPUTATION;  Surgeon: Newt Minion, MD;  Location: [place];  Service: Orthopedics;  Laterality: Left;   AMPUTATION Left 10/11/2019   Procedure: LEFT ABOVE KNEE  AMPUTATION;  Surgeon: Newt Minion, MD;  Location: [place];  Service: Orthopedics;  Laterality: Left;   AV FISTULA PLACEMENT Left 01/05/2021   Procedure: ARTERIOVENOUS (AV) FISTULA CREATION vs. GRAFT;  Surgeon: Angelia Mould, MD;  Location: [facility];  Service: Vascular;  Laterality: Left;   CATARACT EXTRACTION W/PHACO Right 02/11/2020   Procedure: CATARACT EXTRACTION PHACO AND INTRAOCULAR LENS PLACEMENT ([place]);  Surgeon: Jalene Mullet, MD;  Location: [place];  Service: Ophthalmology;  Laterality: Right;   EYE SURGERY     lazer   FISTULA SUPERFICIALIZATION Left 02/23/2021   Procedure: SUPERFICIALIZATION OF LEFT BRACHIOCEPHALIC FISTULA;  Surgeon: Angelia Mould, MD;  Location: [place];  Service: Vascular;  Laterality: Left;   GAS INSERTION Right 12/31/2019   Procedure: INSERTION OF GAS;  Surgeon: Jalene Mullet, MD;  Location: [place];  Service: Ophthalmology;  Laterality: Right;   INCISION AND DRAINAGE ABSCESS     multiple I&Ds   INCISION AND DRAINAGE ABSCESS Left 07/09/2012   Procedure: DRESSING CHANGE, THIGH WOUND;  Surgeon: Harl Bowie, MD;  Location: [place];  Service: General;  Laterality: Left;   INCISION AND DRAINAGE OF WOUND Left 07/07/2012   Procedure: IRRIGATION AND DEBRIDEMENT WOUND;  Surgeon: Harl Bowie, MD;  Location: [place];  Service: General;  Laterality: Left;   INCISION AND DRAINAGE PERIRECTAL ABSCESS Left 07/14/2012   Procedure: DEBRIDEMENT OF SKIN & SOFT TISSUE; DRESSING CHANGE UNDER ANESTHESIA;  Surgeon: Gayland Curry, MD,FACS;  Location: [place];  Service: General;  Laterality: Left;   INCISION AND DRAINAGE PERIRECTAL ABSCESS Left 07/16/2012   Procedure: I&D Left Thigh;  Surgeon: Gwenyth Ober, MD;  Location: [place];  Service: General;  Laterality: Left;   INCISION AND DRAINAGE PERIRECTAL ABSCESS N/A 01/05/2015   Procedure: IRRIGATION AND DEBRIDEMENT PERIRECTAL ABSCESS;  Surgeon: Donnie Mesa, MD;  Location: [place];  Service: General;  Laterality: N/A;    INSERTION OF DIALYSIS CATHETER Right 01/05/2021   Procedure: INSERTION OF DIALYSIS CATHETER;  Surgeon: Angelia Mould, MD;  Location: [place];  Service: Vascular;  Laterality: Right;   IRRIGATION AND DEBRIDEMENT ABSCESS Left 07/06/2012   Procedure: IRRIGATION AND DEBRIDEMENT ABSCESS BUTTOCKS AND THIGH;  Surgeon: Shann Medal, MD;  Location: [place];  Service: General;  Laterality: Left;   IRRIGATION AND DEBRIDEMENT ABSCESS Left 08/10/2012   Procedure: IRRIGATION AND DEBRIDEMENT ABSCESS;  Surgeon: Madilyn Hook, DO;  Location: [place];  Service: General;  Laterality: Left;   PARS PLANA VITRECTOMY Right 12/31/2019   Procedure: PARS PLANA VITRECTOMY WITH 25 GAUGE;  Surgeon: Jalene Mullet, MD;  Location: [place];  Service: Ophthalmology;  Laterality: Right;   PERIPHERAL  VASCULAR INTERVENTION Left 05/12/2021   Procedure: PERIPHERAL VASCULAR INTERVENTION;  Surgeon: Broadus John, MD;  Location: [place] [facility];  Service: Cardiovascular;  Laterality: Left;  arm fistula   PHOTOCOAGULATION WITH LASER Right 12/31/2019   Procedure: PHOTOCOAGULATION WITH LASER;  Surgeon: Jalene Mullet, MD;  Location: [place];  Service: Ophthalmology;  Laterality: Right;   REPAIR OF COMPLEX TRACTION RETINAL DETACHMENT Right 12/31/2019   Procedure: REPAIR OF COMPLEX TRACTION RETINAL;  Surgeon: Jalene Mullet, MD;  Location: [place];  Service: Ophthalmology;  Laterality: Right;   STUMP REVISION Left 06/09/2017   Procedure: REVISION LEFT BELOW KNEE AMPUTATION;  Surgeon: Newt Minion, MD;  Location: [place];  Service: Orthopedics;  Laterality: Left;   STUMP REVISION Left 09/04/2019   Procedure: LEFT BELOW KNEE AMPUTATION REVISION;  Surgeon: Newt Minion, MD;  Location: [place];  Service: Orthopedics;  Laterality: Left;   STUMP REVISION Left 09/20/2019   Procedure: LEFT BELOW KNEE AMPUTATION REVISION;  Surgeon: Newt Minion, MD;  Location: [place];  Service: Orthopedics;  Laterality: Left;   TONSILLECTOMY  1994   Patient  Active Problem List   Diagnosis Date Noted   Urgency of urination 09/12/2021   Hidradenitis suppurativa 02/24/2021   Cough 05/26/2020   Chronic pain syndrome 01/16/2020   Hx of AKA (above knee amputation), left (HCC)    Blurry vision, right eye 10/08/2019   Tobacco use 03/01/2019   Phantom limb pain ([place]) 11/08/2018   Current moderate episode of major depressive disorder (HCC)    Hyperkalemia    Anemia of chronic disease    Bipolar affective disorder ([place])    Heart failure with preserved ejection fraction ([place]) 02/09/2017   Healthcare maintenance 12/08/2016   End-stage renal disease on hemodialysis ([place]) 07/15/2016   OSA (obstructive sleep apnea) 05/13/2016   Morbid obesity due to excess calories ([place]) 11/27/2015   Subclinical hypothyroidism 04/25/2013   Type 2 diabetes mellitus ([place]) 07/12/2012   Carpal tunnel syndrome, bilateral 01/25/2012   Diabetic polyneuropathy associated with type 2 diabetes mellitus ([place]) 07/04/2011   Anxiety and depression 06/02/2011   GERD 12/06/2007   Hyperlipidemia 08/29/2007   Hypertension associated with diabetes ([place]) 08/29/2007    REFERRING DIAG: J62.831 (ICD-10-CM) - Hx of AKA (above knee amputation), left (HCC)    THERAPY DIAG:  Other abnormalities of gait and mobility  Muscle weakness (generalized)  Unsteadiness on feet  PERTINENT HISTORY: ESRD, HTN, Chronic Pain, s/p left AKA    PRECAUTIONS: Fall, on dialysis T/Th/Sat. Fistula upper left arm   SUBJECTIVE:  No new changes since she was last here.   PAIN:  Are you having pain? Yes Pain location: R hip  Pain rating: 3/10     TODAY'S TREATMENT:    CURRENT PROSTHETIC WEAR ASSESSMENT: Donning prosthesis: Modified independence Doffing prosthesis: Modified independence Prosthetic wear tolerance:  Residual limb condition: continues with the white circular area at distal end of limb. Continues to be itchy. Has been using cream that PCP gave her that is not helping to clear it up, on  her 3rd tube. Still has not called Dr. Jess Barters office as yet, reinforced she should call Dr. Sharol Given. Comments: currently wearing 3 ply sock for secure fit. Checked pelvic height with pt slightly taller on sound side (~ 2 cm difference).    NMR with L AKA prosthetic donned  -Staggered sit <>stands using 2" step, x8 per side, for improved eccentric quad control, BLE strength and single leg stability. Min guard throughout for safety. Pt w/increased  difficulty w/RLE on step compared to LLE.  -Staggered stance RDLs, x5 per side w/min guard for improved hamstring strength, posterior chain strength and narrow BOS. W/LLE forward, performed while holding 10# KB (one hand on KB and one hand on RW for stability). W/RLE forward, performed unweighted w/unilateral support on RW. Pt reported effort as 7/10.  -Mini squats w/1# ankle weight propped under L heel to bias knee flexion and maintain eccentric knee control, x10 reps w/CGA. Min A to stabilize L prosthetic foot throughout.   Ther Ex  SciFit multi-peaks level 3 for 8 minutes using BUE/BLEs for cardiovascular conditioning, UE/LE coordination and dynamic cool down to prevent excessive soreness in LLE/low back. RPE of 3/10 following activity.    GAIT: Gait pattern: step through pattern, decreased step length- Right, decreased stance time- Left, and circumduction- Left Distance walked: around clinic with session Assistive device utilized: Walker - 2 wheeled and prosthesis Level of assistance: SBA Comments: pt with decreased circumduction noted with gait this session with good engagement of prosthetic knee with swing phase.      PATIENT EDUCATION: Education details: continue with HEP, using treadmill next session  Person educated: Patient Education method: Explanation Education comprehension: verbalized understanding     HOME EXERCISE PROGRAM: Access Code: [email or web address] URL: [email or web address] Date: 09/10/2021 Prepared by: Cherly Anderson  Exercises - Supine Lower Trunk Rotation  - 2 x daily - 7 x weekly - 1 sets - 4 reps - 20 sec hold - Single Knee to Chest Stretch  - 2 x daily - 7 x weekly - 1 sets - 4 reps - 20 sec hold - Modified Thomas Stretch  - 2 x daily - 7 x weekly - 1 sets - 3 reps - 30 sec hold - Sit to Stand  - 2 x daily - 5 x weekly - 2 sets - 5 reps - Standing Hip Abduction with Counter Support  - 1 x daily - 5 x weekly - 2 sets - 10 reps - Tapping bottom of cabinet at sink  - 1 x daily - 5 x weekly - 2 sets - 10 reps - Standing Balance in Corner with Eyes Closed  - 2 x daily - 5 x weekly - 1 sets - 3 reps - 20 sec hold - Head turns side to side and up/down  - 2 x daily - 5 x weekly - 1 sets - 3 reps - 20 sec hold - Semi-Tandem Corner Balance With Eyes Open  - 2 x daily - 5 x weekly - 1 sets - 2 reps - 20 sec hold        GOALS: Goals reviewed with patient? Yes   SHORT TERM GOALS: Target date: 11/08/2021   Pt will be independent with continued HEP for strengthening, balance and aquatic program. Baseline: Has initial HEP, Will plan to start aquatic program soon Goal status: MET   2.  Pt will decrease TUG to <28 secs for improved balance and decreased fall risk. Baseline: 10/29/21: 26 seconds  Goal status: MET   3.  Pt will be independent with prosthetic management when receives new socket for improved function. Baseline: 11/03/21: met with new socket Goal status: MET   4.  Pt will be able to ambulate >400' on varied level surfaces (including simulated carpet) with walker while initiating knee flexion in prosthesis for improved mobility. Baseline: 11/03/21: 500 feet on varied surfaces (paved, grass, gravel)  Goal status: MET   5. Pt will improve BERG balance score to 46/56  in order to indicate dec fall risk.   Baseline: 11/03/21: 48/56 scored today  Goal status: MET  6. Pt will ambulate 150' with single loftstrand crutch vs quad tip cane at S level in order to indicate more independent household  ambulation.   Baseline: 11/05/21: 115 feet with forearm crutch with min guard assist, improved from 115' with crutch at min A level  Goal status: PARTIALLY MET         LONG TERM GOALS: Target date:  12/06/21   Pt will be independent with progressive HEP for strengthening, balance and aerobic activity to continue gains on own. Baseline: 10/01/21: has HEP, however has limited time to perform them at home due to daughters schedule/family members staying at her house so no room to do anything.  Goal status: ONGOING    2.  Pt will ambulate 200' with cane versus loftstrand crutch on unlevel short community distances at S level.  Baseline: 10/01/21: gait with cane has been initiated in prior sessions, not to goal level; 115' with [place] cane with CGA Goal status: progressing   3.  Pt will increase gait speed to 2.62 ft/sec w/ LRAD for improved mobility. Baseline: 10/01/21: 2.07 ft/sec or  0.63 m/s. Goal status: REVISED    4.  Pt will increase Berg to >/= 50/56 for improved balance and decreased fall risk. Baseline: 10/01/21: 42/56 scored today Goal status: REVISED    5.  Pt will negotiate 4 stairs w/ B crutches, up/down curb/ramp with single crutch vs cane at S level in order to indicate safe community mobility.   Baseline: min A at this time with RW  Goal Status: NEW    ASSESSMENT:   CLINICAL IMPRESSION:  Emphasis of skilled PT session on BLE strength, improved control of L prosthetic knee flexion/extension and single leg stability. Pt tolerated session well, reported increased fatigue in posterior chain musculature but no increase in pain. Noted decreased extension thrust to lock knee today and pt reported feeling as though she is getting better at locking/unlocking L knee. Pt continues to demonstrate circumduction of LLE w/swing phase. Continue POC.    OBJECTIVE IMPAIRMENTS Abnormal gait, decreased activity tolerance, decreased balance, decreased knowledge of use of DME, decreased  mobility, decreased strength, prosthetic dependency , and pain.    ACTIVITY LIMITATIONS cleaning and community activity.    PERSONAL FACTORS Time since onset of injury/illness/exacerbation and 3+ comorbidities:  DM2, obesity, and bipolar disorder, HTN, CHF, heart murmur, peripheral neuropathy, ESRD on dialysis  are also affecting patient's functional outcome.      REHAB POTENTIAL: Good   CLINICAL DECISION MAKING: Evolving/moderate complexity   EVALUATION COMPLEXITY: Moderate         PLAN: PT FREQUENCY: 2x/week   PT DURATION: 8 weeks   PLANNED INTERVENTIONS: Therapeutic exercises, Therapeutic activity, Neuromuscular re-education, Balance training, Gait training, Patient/Family education, Joint mobilization, Stair training, Prosthetic training, DME instructions, Aquatic Therapy, Cryotherapy, Moist heat, and Manual therapy   PLAN FOR NEXT SESSION:  . Treadmill training. continue with activities to promote prosthetic knee hydraulics engagement, static and dynamic balance training with decreased UE support. Continue with forearm crutch with gait training on straight paths for distance, and turns/stepping around/over obstacles   Aury Scollard E Brannan Cassedy, PT, DPT 11/19/21, 10:17 AM

## 2021-11-20 DIAGNOSIS — L299 Pruritus, unspecified: Secondary | ICD-10-CM | POA: Diagnosis not present

## 2021-11-20 DIAGNOSIS — D689 Coagulation defect, unspecified: Secondary | ICD-10-CM | POA: Diagnosis not present

## 2021-11-20 DIAGNOSIS — D631 Anemia in chronic kidney disease: Secondary | ICD-10-CM | POA: Diagnosis not present

## 2021-11-20 DIAGNOSIS — D509 Iron deficiency anemia, unspecified: Secondary | ICD-10-CM | POA: Diagnosis not present

## 2021-11-20 DIAGNOSIS — N2581 Secondary hyperparathyroidism of renal origin: Secondary | ICD-10-CM | POA: Diagnosis not present

## 2021-11-20 DIAGNOSIS — N186 End stage renal disease: Secondary | ICD-10-CM | POA: Diagnosis not present

## 2021-11-20 DIAGNOSIS — E1122 Type 2 diabetes mellitus with diabetic chronic kidney disease: Secondary | ICD-10-CM | POA: Diagnosis not present

## 2021-11-20 DIAGNOSIS — Z992 Dependence on renal dialysis: Secondary | ICD-10-CM | POA: Diagnosis not present

## 2021-11-23 DIAGNOSIS — D631 Anemia in chronic kidney disease: Secondary | ICD-10-CM | POA: Diagnosis not present

## 2021-11-23 DIAGNOSIS — N2581 Secondary hyperparathyroidism of renal origin: Secondary | ICD-10-CM | POA: Diagnosis not present

## 2021-11-23 DIAGNOSIS — E1122 Type 2 diabetes mellitus with diabetic chronic kidney disease: Secondary | ICD-10-CM | POA: Diagnosis not present

## 2021-11-23 DIAGNOSIS — D509 Iron deficiency anemia, unspecified: Secondary | ICD-10-CM | POA: Diagnosis not present

## 2021-11-23 DIAGNOSIS — L299 Pruritus, unspecified: Secondary | ICD-10-CM | POA: Diagnosis not present

## 2021-11-23 DIAGNOSIS — Z992 Dependence on renal dialysis: Secondary | ICD-10-CM | POA: Diagnosis not present

## 2021-11-23 DIAGNOSIS — D689 Coagulation defect, unspecified: Secondary | ICD-10-CM | POA: Diagnosis not present

## 2021-11-23 DIAGNOSIS — N186 End stage renal disease: Secondary | ICD-10-CM | POA: Diagnosis not present

## 2021-11-24 ENCOUNTER — Ambulatory Visit
Payer: Medicare Other | Attending: Student in an Organized Health Care Education/Training Program | Admitting: Physical Therapy

## 2021-11-24 DIAGNOSIS — R2689 Other abnormalities of gait and mobility: Secondary | ICD-10-CM | POA: Insufficient documentation

## 2021-11-24 DIAGNOSIS — M6281 Muscle weakness (generalized): Secondary | ICD-10-CM | POA: Insufficient documentation

## 2021-11-24 DIAGNOSIS — R2681 Unsteadiness on feet: Secondary | ICD-10-CM | POA: Insufficient documentation

## 2021-11-24 NOTE — Therapy (Signed)
OUTPATIENT PHYSICAL THERAPY TREATMENT NOTE  Patient Name: Idolina Mantell MRN: [id number] DOB:1976/01/18, 46 y.o., female Today's Date: 11/24/2021  PCP: Riesa Pope, MD REFERRING PROVIDER: Riesa Pope, MD    PT End of Session - 11/24/21 1321     Visit Number 22    Number of Visits 33   updated to reflect recert from 5/46/56   Date for PT Re-Evaluation 12/06/21    Authorization Type human[employer] medicare with medicaid secondary. JULY 1st primary insurance changes to [facility] Medicare    Authorization Time Period 17 visits 08/09/21-10/08/21; 16 visits  5/22- 12/06/2021    Authorization - Visit Number 11    Authorization - Number of Visits 16    Progress Note Due on Visit 20    PT Start Time 1319    PT Stop Time 1402    PT Time Calculation (min) 43 min    Equipment Utilized During Treatment Gait belt    Activity Tolerance Patient tolerated treatment well    Behavior During Therapy WFL for tasks assessed/performed                Past Medical History:  Diagnosis Date   Abdominal muscle pain 09/08/2016   Abnormal Pap smear of cervix 2009   Abscess    history of multiple abscesses   Acute bilateral low back pain 02/13/2017   Acute blood loss anemia    Acute on chronic renal failure ([place]) 07/12/2012   Acute renal failure ([place]) 07/12/2012   Acute respiratory failure with hypoxia ([place]) 10/21/2019   Adjustment disorder with depressed mood 03/26/2017   AKI (acute kidney injury) ([place])    Anemia 10/21/2019   Anemia of chronic disease 2002   Anginal pain (HCC)    Anxiety    Panic attacks   Bilateral lower extremity edema 05/13/2016   Bipolar disorder ([place])    Cataract    B/L cataract   Cellulitis 05/21/2014   right eye   CHF (congestive heart failure) ([place])    Chronic bronchitis ([place])    "get it q yr" (05/13/2013)   Chronic pain    Chronic pain of right knee 09/08/2016   Dehiscence of amputation stump ([place])    Depression    Edema of lower extremity     Endocarditis 2002   subacute bacterial endocarditis.    Fall    Family history of anesthesia complication    "my mom has a hard time coming out from under"   Fibromyalgia    GERD (gastroesophageal reflux disease)    occ   Heart murmur    Herpes simplex type 1 infection 01/16/2018   History of blood transfusion    "just low blood count" (05/13/2013)   Hyperlipidemia    Hypertension    Hypothyroidism    Hypothyroidism, adult 03/21/2014   Hypoxia    Leukocytosis    Muscle spasm 12/16/2020   Necrosis ([place])    and ulceration   Necrotizing fasciitis s/p OR debridements 07/06/2012   Obesity    OSA on CPAP    does not wear all the time   Peripheral neuropathy    Pneumonia    Pyelonephritis 12/31/2020   Routine adult health maintenance 12/08/2016   Severe protein-calorie malnutrition ([place])    Status post below knee amputation, left ([place]) 04/11/2017   Type II diabetes mellitus (HCC)    Type  II   Vaginal irritation 03/14/2016   Weak pulse 07/21/2021   Wound dehiscence 09/04/2019   Past Surgical History:  Procedure Laterality Date  A/V FISTULAGRAM Left 05/12/2021   Procedure: A/V FISTULAGRAM;  Surgeon: Broadus John, MD;  Location: [facility];  Service: Cardiovascular;  Laterality: Left;   ABDOMINAL AORTOGRAM W/LOWER EXTREMITY Left 03/19/2019   Procedure: ABDOMINAL AORTOGRAM W/LOWER EXTREMITY;  Surgeon: Serafina Mitchell, MD;  Location: [facility];  Service: Cardiovascular;  Laterality: Left;   ABOVE KNEE LEG AMPUTATION Left 10/11/2019   wound dehisence    AIR/FLUID EXCHANGE Right 12/31/2019   Procedure: AIR/FLUID EXCHANGE;  Surgeon: Jalene Mullet, MD;  Location: [facility];  Service: Ophthalmology;  Laterality: Right;   AMPUTATION Left 04/07/2017   Procedure: LEFT BELOW KNEE AMPUTATION;  Surgeon: Newt Minion, MD;  Location: [facility];  Service: Orthopedics;  Laterality: Left;   AMPUTATION Left 10/11/2019   Procedure: LEFT ABOVE KNEE AMPUTATION;  Surgeon: Newt Minion, MD;  Location: [facility];  Service: Orthopedics;  Laterality: Left;   AV FISTULA PLACEMENT Left 01/05/2021   Procedure: ARTERIOVENOUS (AV) FISTULA CREATION vs. GRAFT;  Surgeon: Angelia Mould, MD;  Location: [facility];  Service: Vascular;  Laterality: Left;   CATARACT EXTRACTION W/PHACO Right 02/11/2020   Procedure: CATARACT EXTRACTION PHACO AND INTRAOCULAR LENS PLACEMENT ([place]);  Surgeon: Jalene Mullet, MD;  Location: [facility];  Service: Ophthalmology;  Laterality: Right;   EYE SURGERY     lazer   FISTULA SUPERFICIALIZATION Left 02/23/2021   Procedure: SUPERFICIALIZATION OF LEFT BRACHIOCEPHALIC FISTULA;  Surgeon: Angelia Mould, MD;  Location: [facility];  Service: Vascular;  Laterality: Left;   GAS INSERTION Right 12/31/2019   Procedure: INSERTION OF GAS;  Surgeon: Jalene Mullet, MD;  Location: [facility];  Service: Ophthalmology;  Laterality: Right;   INCISION AND DRAINAGE ABSCESS     multiple I&Ds   INCISION AND DRAINAGE ABSCESS Left 07/09/2012   Procedure: DRESSING CHANGE, THIGH WOUND;  Surgeon: Harl Bowie, MD;  Location: [facility];  Service: General;  Laterality: Left;   INCISION AND DRAINAGE OF WOUND Left 07/07/2012   Procedure: IRRIGATION AND DEBRIDEMENT WOUND;  Surgeon: Harl Bowie, MD;  Location: [facility];  Service: General;  Laterality: Left;   INCISION AND DRAINAGE PERIRECTAL ABSCESS Left 07/14/2012   Procedure: DEBRIDEMENT OF SKIN & SOFT TISSUE; DRESSING CHANGE UNDER ANESTHESIA;  Surgeon: Gayland Curry, MD,FACS;  Location: [facility];  Service: General;  Laterality: Left;   INCISION AND DRAINAGE PERIRECTAL ABSCESS Left 07/16/2012   Procedure: I&D Left Thigh;  Surgeon: Gwenyth Ober, MD;  Location: [facility];  Service: General;  Laterality: Left;   INCISION AND DRAINAGE PERIRECTAL ABSCESS N/A 01/05/2015   Procedure: IRRIGATION AND DEBRIDEMENT PERIRECTAL ABSCESS;  Surgeon: Donnie Mesa, MD;  Location: [facility];  Service: General;  Laterality: N/A;   INSERTION OF DIALYSIS CATHETER Right  01/05/2021   Procedure: INSERTION OF DIALYSIS CATHETER;  Surgeon: Angelia Mould, MD;  Location: [facility];  Service: Vascular;  Laterality: Right;   IRRIGATION AND DEBRIDEMENT ABSCESS Left 07/06/2012   Procedure: IRRIGATION AND DEBRIDEMENT ABSCESS BUTTOCKS AND THIGH;  Surgeon: Shann Medal, MD;  Location: [facility];  Service: General;  Laterality: Left;   IRRIGATION AND DEBRIDEMENT ABSCESS Left 08/10/2012   Procedure: IRRIGATION AND DEBRIDEMENT ABSCESS;  Surgeon: Madilyn Hook, DO;  Location: [facility];  Service: General;  Laterality: Left;   PARS PLANA VITRECTOMY Right 12/31/2019   Procedure: PARS PLANA VITRECTOMY WITH 25 GAUGE;  Surgeon: Jalene Mullet, MD;  Location: [facility];  Service: Ophthalmology;  Laterality: Right;   PERIPHERAL VASCULAR INTERVENTION Left 05/12/2021   Procedure:  PERIPHERAL VASCULAR INTERVENTION;  Surgeon: Broadus John, MD;  Location: [facility];  Service: Cardiovascular;  Laterality: Left;  arm fistula   PHOTOCOAGULATION WITH LASER Right 12/31/2019   Procedure: PHOTOCOAGULATION WITH LASER;  Surgeon: Jalene Mullet, MD;  Location: [facility];  Service: Ophthalmology;  Laterality: Right;   REPAIR OF COMPLEX TRACTION RETINAL DETACHMENT Right 12/31/2019   Procedure: REPAIR OF COMPLEX TRACTION RETINAL;  Surgeon: Jalene Mullet, MD;  Location: [facility];  Service: Ophthalmology;  Laterality: Right;   STUMP REVISION Left 06/09/2017   Procedure: REVISION LEFT BELOW KNEE AMPUTATION;  Surgeon: Newt Minion, MD;  Location: [facility];  Service: Orthopedics;  Laterality: Left;   STUMP REVISION Left 09/04/2019   Procedure: LEFT BELOW KNEE AMPUTATION REVISION;  Surgeon: Newt Minion, MD;  Location: [facility];  Service: Orthopedics;  Laterality: Left;   STUMP REVISION Left 09/20/2019   Procedure: LEFT BELOW KNEE AMPUTATION REVISION;  Surgeon: Newt Minion, MD;  Location: [facility];  Service: Orthopedics;  Laterality: Left;   TONSILLECTOMY  1994   Patient Active Problem List   Diagnosis  Date Noted   Urgency of urination 09/12/2021   Hidradenitis suppurativa 02/24/2021   Cough 05/26/2020   Chronic pain syndrome 01/16/2020   Hx of AKA (above knee amputation), left (HCC)    Blurry vision, right eye 10/08/2019   Tobacco use 03/01/2019   Phantom limb pain ([place]) 11/08/2018   Current moderate episode of major depressive disorder (HCC)    Hyperkalemia    Anemia of chronic disease    Bipolar affective disorder ([place])    Heart failure with preserved ejection fraction ([place]) 02/09/2017   Healthcare maintenance 12/08/2016   End-stage renal disease on hemodialysis ([place]) 07/15/2016   OSA (obstructive sleep apnea) 05/13/2016   Morbid obesity due to excess calories ([place]) 11/27/2015   Subclinical hypothyroidism 04/25/2013   Type 2 diabetes mellitus ([place]) 07/12/2012   Carpal tunnel syndrome, bilateral 01/25/2012   Diabetic polyneuropathy associated with type 2 diabetes mellitus ([place]) 07/04/2011   Anxiety and depression 06/02/2011   GERD 12/06/2007   Hyperlipidemia 08/29/2007   Hypertension associated with diabetes ([place]) 08/29/2007    REFERRING DIAG: Z66.294 (ICD-10-CM) - Hx of AKA (above knee amputation), left (HCC)    THERAPY DIAG:  Other abnormalities of gait and mobility  Muscle weakness (generalized)  Unsteadiness on feet  PERTINENT HISTORY: ESRD, HTN, Chronic Pain, s/p left AKA    PRECAUTIONS: Fall, on dialysis T/Th/Sat. Fistula upper left arm   SUBJECTIVE: Reports having a good weekend, did a lot of walking. No new changes   PAIN:  Are you having pain? No Pain location:  Pain rating:     TODAY'S TREATMENT:    CURRENT PROSTHETIC WEAR ASSESSMENT: Donning prosthesis: Modified independence Doffing prosthesis: Modified independence Prosthetic wear tolerance:  Residual limb condition: continues with the white circular area at distal end of limb. Continues to be itchy. Has been using cream that PCP gave her that is not helping to clear it up, on her 3rd tube. Still  has not called Dr. Jess Barters office as yet, reinforced she should call Dr. Sharol Given. Comments: currently wearing 3 ply sock for secure fit. Checked pelvic height with pt slightly taller on sound side (~ 2 cm difference).    Gait Training with L AKA prosthetic donned  The following treadmill training was completed for aerobic/neural priming, endurance, prosthetic training and gait speed: -Round 1: 6 minutes at 1.0 mph and BUE support. Noted significant circumduction of LLE initially w/hip  hiking, min cues to "march knee forward" and pt demonstrated reduction of circumduction and facilitation of L hip/knee flexion. Noted decreased eccentric control of LLE from IC to MS, min cues to land softly throughout. RPE of 1/10 following first trial.   -Round 2: 5:55 at 1.3 mph w/BUE support. Noted maintain drag and occasional foot drag of LLE w/L hip hike. Min cues for increased stance time on LLE to facilitate prolonged toe off and hip extension for improved knee flexion. Needed to use emergency stop button due to L foot catching on treadmill, pt able to recover independently. -Round 3: 6:00 at 1.3 mph w/BUE support. Pt able to fully extend L hip and initiate full knee flexion of prosthetic for first time. Pt reported she counted "1,2,3" to facilitate full flexion and was able to maintain adequate knee flexion throughout round.  -Round 4: 2:00 at 1.3 mph w/BUE support. Pt reported increase in R knee pain so ended trial early.    PATIENT EDUCATION: Education details: continue with HEP and practicing full knee flexion of LLE, making appointment with prosthetist to adjust length of prosthetic (pt reports it feels too short) Person educated: Patient Education method: Explanation Education comprehension: verbalized understanding     HOME EXERCISE PROGRAM: Access Code: [email or web address] URL: [email or web address] Date: 09/10/2021 Prepared by: Cherly Anderson  Exercises - Supine Lower Trunk Rotation  - 2 x daily  - 7 x weekly - 1 sets - 4 reps - 20 sec hold - Single Knee to Chest Stretch  - 2 x daily - 7 x weekly - 1 sets - 4 reps - 20 sec hold - Modified Thomas Stretch  - 2 x daily - 7 x weekly - 1 sets - 3 reps - 30 sec hold - Sit to Stand  - 2 x daily - 5 x weekly - 2 sets - 5 reps - Standing Hip Abduction with Counter Support  - 1 x daily - 5 x weekly - 2 sets - 10 reps - Tapping bottom of cabinet at sink  - 1 x daily - 5 x weekly - 2 sets - 10 reps - Standing Balance in Corner with Eyes Closed  - 2 x daily - 5 x weekly - 1 sets - 3 reps - 20 sec hold - Head turns side to side and up/down  - 2 x daily - 5 x weekly - 1 sets - 3 reps - 20 sec hold - Semi-Tandem Corner Balance With Eyes Open  - 2 x daily - 5 x weekly - 1 sets - 2 reps - 20 sec hold        GOALS: Goals reviewed with patient? Yes   SHORT TERM GOALS: Target date: 11/08/2021   Pt will be independent with continued HEP for strengthening, balance and aquatic program. Baseline: Has initial HEP, Will plan to start aquatic program soon Goal status: MET   2.  Pt will decrease TUG to <28 secs for improved balance and decreased fall risk. Baseline: 10/29/21: 26 seconds  Goal status: MET   3.  Pt will be independent with prosthetic management when receives new socket for improved function. Baseline: 11/03/21: met with new socket Goal status: MET   4.  Pt will be able to ambulate >400' on varied level surfaces (including simulated carpet) with walker while initiating knee flexion in prosthesis for improved mobility. Baseline: 11/03/21: 500 feet on varied surfaces (paved, grass, gravel)  Goal status: MET   5. Pt will improve BERG balance score to 46/56  in order to indicate dec fall risk.   Baseline: 11/03/21: 48/56 scored today  Goal status: MET  6. Pt will ambulate 150' with single loftstrand crutch vs quad tip cane at S level in order to indicate more independent household ambulation.   Baseline: 11/05/21: 115 feet with forearm crutch  with min guard assist, improved from 115' with crutch at min A level  Goal status: PARTIALLY MET         LONG TERM GOALS: Target date:  12/06/21   Pt will be independent with progressive HEP for strengthening, balance and aerobic activity to continue gains on own. Baseline: 10/01/21: has HEP, however has limited time to perform them at home due to daughters schedule/family members staying at her house so no room to do anything.  Goal status: ONGOING    2.  Pt will ambulate 200' with cane versus loftstrand crutch on unlevel short community distances at S level.  Baseline: 10/01/21: gait with cane has been initiated in prior sessions, not to goal level; 115' with [place] cane with CGA Goal status: progressing   3.  Pt will increase gait speed to 2.62 ft/sec w/ LRAD for improved mobility. Baseline: 10/01/21: 2.07 ft/sec or  0.63 m/s. Goal status: REVISED    4.  Pt will increase Berg to >/= 50/56 for improved balance and decreased fall risk. Baseline: 10/01/21: 42/56 scored today Goal status: REVISED    5.  Pt will negotiate 4 stairs w/ B crutches, up/down curb/ramp with single crutch vs cane at S level in order to indicate safe community mobility.   Baseline: min A at this time with RW  Goal Status: NEW    ASSESSMENT:   CLINICAL IMPRESSION:  Emphasis of skilled PT session on gait training on treadmill for improved control of L knee flexion, increased step length and endurance. Pt initially demonstrated significant L hip hike w/circumduction but was able to facilitate toe-off and adequate knee flexion of LLE w/min verbal and visual cues. Pt ambulated for a total of 20 minutes today w/short seated rest breaks taken in between trials. Noted poor eccentric control from IC to MS of LLE likely due to prosthesis being too short, encouraged pt to call prosthetist to make adjustment appointment. Continue POC.    OBJECTIVE IMPAIRMENTS Abnormal gait, decreased activity tolerance, decreased  balance, decreased knowledge of use of DME, decreased mobility, decreased strength, prosthetic dependency , and pain.    ACTIVITY LIMITATIONS cleaning and community activity.    PERSONAL FACTORS Time since onset of injury/illness/exacerbation and 3+ comorbidities:  DM2, obesity, and bipolar disorder, HTN, CHF, heart murmur, peripheral neuropathy, ESRD on dialysis  are also affecting patient's functional outcome.      REHAB POTENTIAL: Good   CLINICAL DECISION MAKING: Evolving/moderate complexity   EVALUATION COMPLEXITY: Moderate         PLAN: PT FREQUENCY: 2x/week   PT DURATION: 8 weeks   PLANNED INTERVENTIONS: Therapeutic exercises, Therapeutic activity, Neuromuscular re-education, Balance training, Gait training, Patient/Family education, Joint mobilization, Stair training, Prosthetic training, DME instructions, Aquatic Therapy, Cryotherapy, Moist heat, and Manual therapy   PLAN FOR NEXT SESSION:  . Appointment with Gerald Stabs? Treadmill training. continue with activities to promote prosthetic knee hydraulics engagement, static and dynamic balance training with decreased UE support. Continue with forearm crutch with gait training on straight paths for distance, and turns/stepping around/over obstacles   Francis Doenges E Keyia Moretto, PT, DPT 11/24/21, 2:03 PM

## 2021-11-25 DIAGNOSIS — Z992 Dependence on renal dialysis: Secondary | ICD-10-CM | POA: Diagnosis not present

## 2021-11-25 DIAGNOSIS — E1122 Type 2 diabetes mellitus with diabetic chronic kidney disease: Secondary | ICD-10-CM | POA: Diagnosis not present

## 2021-11-25 DIAGNOSIS — D509 Iron deficiency anemia, unspecified: Secondary | ICD-10-CM | POA: Diagnosis not present

## 2021-11-25 DIAGNOSIS — N186 End stage renal disease: Secondary | ICD-10-CM | POA: Diagnosis not present

## 2021-11-25 DIAGNOSIS — D631 Anemia in chronic kidney disease: Secondary | ICD-10-CM | POA: Diagnosis not present

## 2021-11-25 DIAGNOSIS — L299 Pruritus, unspecified: Secondary | ICD-10-CM | POA: Diagnosis not present

## 2021-11-25 DIAGNOSIS — D689 Coagulation defect, unspecified: Secondary | ICD-10-CM | POA: Diagnosis not present

## 2021-11-25 DIAGNOSIS — N2581 Secondary hyperparathyroidism of renal origin: Secondary | ICD-10-CM | POA: Diagnosis not present

## 2021-11-26 ENCOUNTER — Ambulatory Visit: Payer: Medicare Other

## 2021-11-26 DIAGNOSIS — R2689 Other abnormalities of gait and mobility: Secondary | ICD-10-CM | POA: Diagnosis not present

## 2021-11-26 DIAGNOSIS — M6281 Muscle weakness (generalized): Secondary | ICD-10-CM

## 2021-11-26 DIAGNOSIS — R2681 Unsteadiness on feet: Secondary | ICD-10-CM

## 2021-11-26 NOTE — Therapy (Signed)
OUTPATIENT PHYSICAL THERAPY TREATMENT NOTE  Patient Name: [name] [name] MRN: [id number] DOB:1975/08/30, 46 y.o., female Today's Date: 11/26/2021  PCP: Riesa Pope, MD REFERRING PROVIDER: Riesa Pope, MD    PT End of Session - 11/26/21 0907     Visit Number 23    Number of Visits 33    Date for PT Re-Evaluation 12/06/21    Authorization Type human[employer] medicare with medicaid secondary. JULY 1st primary insurance changes to [facility] Medicare    Authorization Time Period 17 visits 08/09/21-10/08/21; 16 visits  5/22- 12/06/2021    Authorization - Visit Number 11    Authorization - Number of Visits 16    Progress Note Due on Visit 76    PT Start Time 0925    PT Stop Time 1005    PT Time Calculation (min) 40 min    Equipment Utilized During Treatment Gait belt    Activity Tolerance Patient tolerated treatment well    Behavior During Therapy WFL for tasks assessed/performed;Flat affect                Past Medical History:  Diagnosis Date   Abdominal muscle pain 09/08/2016   Abnormal Pap smear of cervix 2009   Abscess    history of multiple abscesses   Acute bilateral low back pain 02/13/2017   Acute blood loss anemia    Acute on chronic renal failure ([place]) 07/12/2012   Acute renal failure ([place]) 07/12/2012   Acute respiratory failure with hypoxia ([place]) 10/21/2019   Adjustment disorder with depressed mood 03/26/2017   AKI (acute kidney injury) ([place])    Anemia 10/21/2019   Anemia of chronic disease 2002   Anginal pain (HCC)    Anxiety    Panic attacks   Bilateral lower extremity edema 05/13/2016   Bipolar disorder ([place])    Cataract    B/L cataract   Cellulitis 05/21/2014   right eye   CHF (congestive heart failure) ([place])    Chronic bronchitis ([place])    "get it q yr" (05/13/2013)   Chronic pain    Chronic pain of right knee 09/08/2016   Dehiscence of amputation stump ([place])    Depression    Edema of lower extremity    Endocarditis 2002   subacute  bacterial endocarditis.    Fall    Family history of anesthesia complication    "my mom has a hard time coming out from under"   Fibromyalgia    GERD (gastroesophageal reflux disease)    occ   Heart murmur    Herpes simplex type 1 infection 01/16/2018   History of blood transfusion    "just low blood count" (05/13/2013)   Hyperlipidemia    Hypertension    Hypothyroidism    Hypothyroidism, adult 03/21/2014   Hypoxia    Leukocytosis    Muscle spasm 12/16/2020   Necrosis ([place])    and ulceration   Necrotizing fasciitis s/p OR debridements 07/06/2012   Obesity    OSA on CPAP    does not wear all the time   Peripheral neuropathy    Pneumonia    Pyelonephritis 12/31/2020   Routine adult health maintenance 12/08/2016   Severe protein-calorie malnutrition ([place])    Status post below knee amputation, left ([place]) 04/11/2017   Type II diabetes mellitus (HCC)    Type  II   Vaginal irritation 03/14/2016   Weak pulse 07/21/2021   Wound dehiscence 09/04/2019   Past Surgical History:  Procedure Laterality Date   A/V FISTULAGRAM Left 05/12/2021  Procedure: A/V FISTULAGRAM;  Surgeon: Broadus John, MD;  Location: [facility];  Service: Cardiovascular;  Laterality: Left;   ABDOMINAL AORTOGRAM W/LOWER EXTREMITY Left 03/19/2019   Procedure: ABDOMINAL AORTOGRAM W/LOWER EXTREMITY;  Surgeon: Serafina Mitchell, MD;  Location: [facility];  Service: Cardiovascular;  Laterality: Left;   ABOVE KNEE LEG AMPUTATION Left 10/11/2019   wound dehisence    AIR/FLUID EXCHANGE Right 12/31/2019   Procedure: AIR/FLUID EXCHANGE;  Surgeon: Jalene Mullet, MD;  Location: [place];  Service: Ophthalmology;  Laterality: Right;   AMPUTATION Left 04/07/2017   Procedure: LEFT BELOW KNEE AMPUTATION;  Surgeon: Newt Minion, MD;  Location: [facility];  Service: Orthopedics;  Laterality: Left;   AMPUTATION Left 10/11/2019   Procedure: LEFT ABOVE KNEE AMPUTATION;  Surgeon: Newt Minion, MD;  Location: [place];   Service: Orthopedics;  Laterality: Left;   AV FISTULA PLACEMENT Left 01/05/2021   Procedure: ARTERIOVENOUS (AV) FISTULA CREATION vs. GRAFT;  Surgeon: Angelia Mould, MD;  Location: [facility];  Service: Vascular;  Laterality: Left;   CATARACT EXTRACTION W/PHACO Right 02/11/2020   Procedure: CATARACT EXTRACTION PHACO AND INTRAOCULAR LENS PLACEMENT ([place]);  Surgeon: Jalene Mullet, MD;  Location: [place];  Service: Ophthalmology;  Laterality: Right;   EYE SURGERY     lazer   FISTULA SUPERFICIALIZATION Left 02/23/2021   Procedure: SUPERFICIALIZATION OF LEFT BRACHIOCEPHALIC FISTULA;  Surgeon: Angelia Mould, MD;  Location: [place];  Service: Vascular;  Laterality: Left;   GAS INSERTION Right 12/31/2019   Procedure: INSERTION OF GAS;  Surgeon: Jalene Mullet, MD;  Location: [place];  Service: Ophthalmology;  Laterality: Right;   INCISION AND DRAINAGE ABSCESS     multiple I&Ds   INCISION AND DRAINAGE ABSCESS Left 07/09/2012   Procedure: DRESSING CHANGE, THIGH WOUND;  Surgeon: Harl Bowie, MD;  Location: [place];  Service: General;  Laterality: Left;   INCISION AND DRAINAGE OF WOUND Left 07/07/2012   Procedure: IRRIGATION AND DEBRIDEMENT WOUND;  Surgeon: Harl Bowie, MD;  Location: [place];  Service: General;  Laterality: Left;   INCISION AND DRAINAGE PERIRECTAL ABSCESS Left 07/14/2012   Procedure: DEBRIDEMENT OF SKIN & SOFT TISSUE; DRESSING CHANGE UNDER ANESTHESIA;  Surgeon: Gayland Curry, MD,FACS;  Location: [place];  Service: General;  Laterality: Left;   INCISION AND DRAINAGE PERIRECTAL ABSCESS Left 07/16/2012   Procedure: I&D Left Thigh;  Surgeon: Gwenyth Ober, MD;  Location: [place];  Service: General;  Laterality: Left;   INCISION AND DRAINAGE PERIRECTAL ABSCESS N/A 01/05/2015   Procedure: IRRIGATION AND DEBRIDEMENT PERIRECTAL ABSCESS;  Surgeon: Donnie Mesa, MD;  Location: [place];  Service: General;  Laterality: N/A;   INSERTION OF DIALYSIS CATHETER Right 01/05/2021   Procedure:  INSERTION OF DIALYSIS CATHETER;  Surgeon: Angelia Mould, MD;  Location: [facility];  Service: Vascular;  Laterality: Right;   IRRIGATION AND DEBRIDEMENT ABSCESS Left 07/06/2012   Procedure: IRRIGATION AND DEBRIDEMENT ABSCESS BUTTOCKS AND THIGH;  Surgeon: Shann Medal, MD;  Location: [place];  Service: General;  Laterality: Left;   IRRIGATION AND DEBRIDEMENT ABSCESS Left 08/10/2012   Procedure: IRRIGATION AND DEBRIDEMENT ABSCESS;  Surgeon: Madilyn Hook, DO;  Location: [place];  Service: General;  Laterality: Left;   PARS PLANA VITRECTOMY Right 12/31/2019   Procedure: PARS PLANA VITRECTOMY WITH 25 GAUGE;  Surgeon: Jalene Mullet, MD;  Location: [place];  Service: Ophthalmology;  Laterality: Right;   PERIPHERAL VASCULAR INTERVENTION Left 05/12/2021   Procedure: PERIPHERAL VASCULAR INTERVENTION;  Surgeon: Virl Cagey,  Carolann Littler, MD;  Location: [facility];  Service: Cardiovascular;  Laterality: Left;  arm fistula   PHOTOCOAGULATION WITH LASER Right 12/31/2019   Procedure: PHOTOCOAGULATION WITH LASER;  Surgeon: Jalene Mullet, MD;  Location: [place];  Service: Ophthalmology;  Laterality: Right;   REPAIR OF COMPLEX TRACTION RETINAL DETACHMENT Right 12/31/2019   Procedure: REPAIR OF COMPLEX TRACTION RETINAL;  Surgeon: Jalene Mullet, MD;  Location: [place];  Service: Ophthalmology;  Laterality: Right;   STUMP REVISION Left 06/09/2017   Procedure: REVISION LEFT BELOW KNEE AMPUTATION;  Surgeon: Newt Minion, MD;  Location: [place];  Service: Orthopedics;  Laterality: Left;   STUMP REVISION Left 09/04/2019   Procedure: LEFT BELOW KNEE AMPUTATION REVISION;  Surgeon: Newt Minion, MD;  Location: [place];  Service: Orthopedics;  Laterality: Left;   STUMP REVISION Left 09/20/2019   Procedure: LEFT BELOW KNEE AMPUTATION REVISION;  Surgeon: Newt Minion, MD;  Location: [place];  Service: Orthopedics;  Laterality: Left;   TONSILLECTOMY  1994   Patient Active Problem List   Diagnosis Date Noted   Urgency of  urination 09/12/2021   Hidradenitis suppurativa 02/24/2021   Cough 05/26/2020   Chronic pain syndrome 01/16/2020   Hx of AKA (above knee amputation), left (HCC)    Blurry vision, right eye 10/08/2019   Tobacco use 03/01/2019   Phantom limb pain ([place]) 11/08/2018   Current moderate episode of major depressive disorder (HCC)    Hyperkalemia    Anemia of chronic disease    Bipolar affective disorder ([place])    Heart failure with preserved ejection fraction ([place]) 02/09/2017   Healthcare maintenance 12/08/2016   End-stage renal disease on hemodialysis ([place]) 07/15/2016   OSA (obstructive sleep apnea) 05/13/2016   Morbid obesity due to excess calories ([place]) 11/27/2015   Subclinical hypothyroidism 04/25/2013   Type 2 diabetes mellitus ([place]) 07/12/2012   Carpal tunnel syndrome, bilateral 01/25/2012   Diabetic polyneuropathy associated with type 2 diabetes mellitus ([place]) 07/04/2011   Anxiety and depression 06/02/2011   GERD 12/06/2007   Hyperlipidemia 08/29/2007   Hypertension associated with diabetes ([place]) 08/29/2007    REFERRING DIAG: Y50.354 (ICD-10-CM) - Hx of AKA (above knee amputation), left (HCC)    THERAPY DIAG:  Other abnormalities of gait and mobility  Muscle weakness (generalized)  Unsteadiness on feet  PERTINENT HISTORY: ESRD, HTN, Chronic Pain, s/p left AKA    PRECAUTIONS: Fall, on dialysis T/Th/Sat. Fistula upper left arm   SUBJECTIVE: Patient reports doing well- still hasn't made an appt with [facility] Given or [facility]. Does have a PCP appt on Monday.   PAIN:  Are you having pain? No Pain location:  Pain rating:     TODAY'S TREATMENT:    CURRENT PROSTHETIC WEAR ASSESSMENT: Donning prosthesis: Modified independence Doffing prosthesis: Modified independence Prosthetic wear tolerance:  Residual limb condition: continues with the white circular area at distal end of limb. Continues to be itchy. Has been using cream that PCP gave her that is not helping to clear it up, on her  3rd tube.    Gait Training with L AKA prosthetic donned  -230' with single loftsrand crutch on R side; initially CGA, but required TotalA x2 to prevent fall (initially gait tx with L LE + crutch simultaneously, but took step with L LE without crutch and knee did not engage resulting in buckling) -Pregait: anterior/posterior step to target with R LE to practice toe off of L LE to flex knee in prep for swing phase  -Treadmill: 3' +  4' initially with hands on lateral supports progressing to in front of her to lessen reliance on UE. Emphasis on L LE swing-thru vs. Circumduction and heel contact   PATIENT EDUCATION: Education details: continue with HEP and L knee swing thru vs circumduction, making appointment with prosthetist to adjust length of prosthetic (pt reports it feels too short) Person educated: Patient Education method: Explanation Education comprehension: verbalized understanding     HOME EXERCISE PROGRAM: Access Code: [email or web address] URL: [email or web address] Date: 09/10/2021 Prepared by: Cherly Anderson  Exercises - Supine Lower Trunk Rotation  - 2 x daily - 7 x weekly - 1 sets - 4 reps - 20 sec hold - Single Knee to Chest Stretch  - 2 x daily - 7 x weekly - 1 sets - 4 reps - 20 sec hold - Modified Thomas Stretch  - 2 x daily - 7 x weekly - 1 sets - 3 reps - 30 sec hold - Sit to Stand  - 2 x daily - 5 x weekly - 2 sets - 5 reps - Standing Hip Abduction with Counter Support  - 1 x daily - 5 x weekly - 2 sets - 10 reps - Tapping bottom of cabinet at sink  - 1 x daily - 5 x weekly - 2 sets - 10 reps - Standing Balance in Corner with Eyes Closed  - 2 x daily - 5 x weekly - 1 sets - 3 reps - 20 sec hold - Head turns side to side and up/down  - 2 x daily - 5 x weekly - 1 sets - 3 reps - 20 sec hold - Semi-Tandem Corner Balance With Eyes Open  - 2 x daily - 5 x weekly - 1 sets - 2 reps - 20 sec hold        GOALS: Goals reviewed with patient? Yes   SHORT TERM GOALS:  Target date: 11/08/2021   Pt will be independent with continued HEP for strengthening, balance and aquatic program. Baseline: Has initial HEP, Will plan to start aquatic program soon Goal status: MET   2.  Pt will decrease TUG to <28 secs for improved balance and decreased fall risk. Baseline: 10/29/21: 26 seconds  Goal status: MET   3.  Pt will be independent with prosthetic management when receives new socket for improved function. Baseline: 11/03/21: met with new socket Goal status: MET   4.  Pt will be able to ambulate >400' on varied level surfaces (including simulated carpet) with walker while initiating knee flexion in prosthesis for improved mobility. Baseline: 11/03/21: 500 feet on varied surfaces (paved, grass, gravel)  Goal status: MET   5. Pt will improve BERG balance score to 46/56 in order to indicate dec fall risk.   Baseline: 11/03/21: 48/56 scored today  Goal status: MET  6. Pt will ambulate 150' with single loftstrand crutch vs quad tip cane at S level in order to indicate more independent household ambulation.   Baseline: 11/05/21: 115 feet with forearm crutch with min guard assist, improved from 115' with crutch at min A level  Goal status: PARTIALLY MET         LONG TERM GOALS: Target date:  12/06/21   Pt will be independent with progressive HEP for strengthening, balance and aerobic activity to continue gains on own. Baseline: 10/01/21: has HEP, however has limited time to perform them at home due to daughters schedule/family members staying at her house so no room to do anything.  Goal status: ONGOING  2.  Pt will ambulate 200' with cane versus loftstrand crutch on unlevel short community distances at S level.  Baseline: 10/01/21: gait with cane has been initiated in prior sessions, not to goal level; 115' with [place] cane with CGA Goal status: progressing   3.  Pt will increase gait speed to 2.62 ft/sec w/ LRAD for improved mobility. Baseline: 10/01/21: 2.07  ft/sec or  0.63 m/s. Goal status: REVISED    4.  Pt will increase Berg to >/= 50/56 for improved balance and decreased fall risk. Baseline: 10/01/21: 42/56 scored today Goal status: REVISED    5.  Pt will negotiate 4 stairs w/ B crutches, up/down curb/ramp with single crutch vs cane at S level in order to indicate safe community mobility.   Baseline: min A at this time with RW  Goal Status: NEW    ASSESSMENT:   CLINICAL IMPRESSION:  Emphasis of skilled PT session on gait training for improved L LE swing thru with foot clearance and heel contact. Patient seeming a little flat this session reporting that sometimes she feels down about still needing to focus so much on her gait. PT providing emotional support. On treadmill, patient with excellent demonstration of minimizing both UE support and circumduction. Continue POC.    OBJECTIVE IMPAIRMENTS Abnormal gait, decreased activity tolerance, decreased balance, decreased knowledge of use of DME, decreased mobility, decreased strength, prosthetic dependency , and pain.    ACTIVITY LIMITATIONS cleaning and community activity.    PERSONAL FACTORS Time since onset of injury/illness/exacerbation and 3+ comorbidities:  DM2, obesity, and bipolar disorder, HTN, CHF, heart murmur, peripheral neuropathy, ESRD on dialysis  are also affecting patient's functional outcome.      REHAB POTENTIAL: Good   CLINICAL DECISION MAKING: Evolving/moderate complexity   EVALUATION COMPLEXITY: Moderate         PLAN: PT FREQUENCY: 2x/week   PT DURATION: 8 weeks   PLANNED INTERVENTIONS: Therapeutic exercises, Therapeutic activity, Neuromuscular re-education, Balance training, Gait training, Patient/Family education, Joint mobilization, Stair training, Prosthetic training, DME instructions, Aquatic Therapy, Cryotherapy, Moist heat, and Manual therapy   PLAN FOR NEXT SESSION:  . Appointment with Gerald Stabs? Treadmill training. continue with activities to promote  prosthetic knee hydraulics engagement, static and dynamic balance training with decreased UE support. Continue with forearm crutch with gait training on straight paths for distance, and turns/stepping around/over obstacles  Debbora Dus, PT, DPT, CBIS  11/26/21, 10:13 AM

## 2021-11-27 DIAGNOSIS — N186 End stage renal disease: Secondary | ICD-10-CM | POA: Diagnosis not present

## 2021-11-27 DIAGNOSIS — L299 Pruritus, unspecified: Secondary | ICD-10-CM | POA: Diagnosis not present

## 2021-11-27 DIAGNOSIS — D631 Anemia in chronic kidney disease: Secondary | ICD-10-CM | POA: Diagnosis not present

## 2021-11-27 DIAGNOSIS — N2581 Secondary hyperparathyroidism of renal origin: Secondary | ICD-10-CM | POA: Diagnosis not present

## 2021-11-27 DIAGNOSIS — D509 Iron deficiency anemia, unspecified: Secondary | ICD-10-CM | POA: Diagnosis not present

## 2021-11-27 DIAGNOSIS — Z992 Dependence on renal dialysis: Secondary | ICD-10-CM | POA: Diagnosis not present

## 2021-11-27 DIAGNOSIS — D689 Coagulation defect, unspecified: Secondary | ICD-10-CM | POA: Diagnosis not present

## 2021-11-27 DIAGNOSIS — E1122 Type 2 diabetes mellitus with diabetic chronic kidney disease: Secondary | ICD-10-CM | POA: Diagnosis not present

## 2021-11-29 DIAGNOSIS — I871 Compression of vein: Secondary | ICD-10-CM | POA: Diagnosis not present

## 2021-11-29 DIAGNOSIS — Z992 Dependence on renal dialysis: Secondary | ICD-10-CM | POA: Diagnosis not present

## 2021-11-29 DIAGNOSIS — N186 End stage renal disease: Secondary | ICD-10-CM | POA: Diagnosis not present

## 2021-11-30 DIAGNOSIS — N186 End stage renal disease: Secondary | ICD-10-CM | POA: Diagnosis not present

## 2021-11-30 DIAGNOSIS — D509 Iron deficiency anemia, unspecified: Secondary | ICD-10-CM | POA: Diagnosis not present

## 2021-11-30 DIAGNOSIS — E1122 Type 2 diabetes mellitus with diabetic chronic kidney disease: Secondary | ICD-10-CM | POA: Diagnosis not present

## 2021-11-30 DIAGNOSIS — N2581 Secondary hyperparathyroidism of renal origin: Secondary | ICD-10-CM | POA: Diagnosis not present

## 2021-11-30 DIAGNOSIS — D631 Anemia in chronic kidney disease: Secondary | ICD-10-CM | POA: Diagnosis not present

## 2021-11-30 DIAGNOSIS — D689 Coagulation defect, unspecified: Secondary | ICD-10-CM | POA: Diagnosis not present

## 2021-11-30 DIAGNOSIS — L299 Pruritus, unspecified: Secondary | ICD-10-CM | POA: Diagnosis not present

## 2021-11-30 DIAGNOSIS — Z992 Dependence on renal dialysis: Secondary | ICD-10-CM | POA: Diagnosis not present

## 2021-12-01 ENCOUNTER — Ambulatory Visit: Payer: Medicare Other | Admitting: Physical Therapy

## 2021-12-02 DIAGNOSIS — D509 Iron deficiency anemia, unspecified: Secondary | ICD-10-CM | POA: Diagnosis not present

## 2021-12-02 DIAGNOSIS — N2581 Secondary hyperparathyroidism of renal origin: Secondary | ICD-10-CM | POA: Diagnosis not present

## 2021-12-02 DIAGNOSIS — E1122 Type 2 diabetes mellitus with diabetic chronic kidney disease: Secondary | ICD-10-CM | POA: Diagnosis not present

## 2021-12-02 DIAGNOSIS — N186 End stage renal disease: Secondary | ICD-10-CM | POA: Diagnosis not present

## 2021-12-02 DIAGNOSIS — L299 Pruritus, unspecified: Secondary | ICD-10-CM | POA: Diagnosis not present

## 2021-12-02 DIAGNOSIS — Z992 Dependence on renal dialysis: Secondary | ICD-10-CM | POA: Diagnosis not present

## 2021-12-02 DIAGNOSIS — D689 Coagulation defect, unspecified: Secondary | ICD-10-CM | POA: Diagnosis not present

## 2021-12-02 DIAGNOSIS — D631 Anemia in chronic kidney disease: Secondary | ICD-10-CM | POA: Diagnosis not present

## 2021-12-03 ENCOUNTER — Encounter: Payer: Self-pay | Admitting: Physical Therapy

## 2021-12-03 ENCOUNTER — Ambulatory Visit: Payer: Medicare Other | Admitting: Physical Therapy

## 2021-12-03 DIAGNOSIS — M6281 Muscle weakness (generalized): Secondary | ICD-10-CM

## 2021-12-03 DIAGNOSIS — R2689 Other abnormalities of gait and mobility: Secondary | ICD-10-CM | POA: Diagnosis not present

## 2021-12-03 DIAGNOSIS — R2681 Unsteadiness on feet: Secondary | ICD-10-CM

## 2021-12-03 NOTE — Therapy (Signed)
OUTPATIENT PHYSICAL THERAPY TREATMENT NOTE  Patient Name: [name] [name] MRN: [id number] DOB:[birth year], 46 y.o., female Today's Date: 12/03/2021  PCP: Riesa Pope, MD REFERRING PROVIDER: Riesa Pope, MD    PT End of Session - 12/03/21 0937     Visit Number 24    Number of Visits 33    Date for PT Re-Evaluation 12/06/21    Authorization Type UHC Medicare/Medicaid secondary    Authorization Time Period --    Authorization - Visit Number --    Authorization - Number of Visits --    Progress Note Due on Visit 30    PT Start Time 0935    PT Stop Time 1015    PT Time Calculation (min) 40 min    Equipment Utilized During Treatment Gait belt    Activity Tolerance Patient tolerated treatment well    Behavior During Therapy [facility] for tasks assessed/performed;Flat affect                Past Medical History:  Diagnosis Date   Abdominal muscle pain 09/08/2016   Abnormal Pap smear of cervix 2009   Abscess    history of multiple abscesses   Acute bilateral low back pain 02/13/2017   Acute blood loss anemia    Acute on chronic renal failure ([place]) 07/12/2012   Acute renal failure ([place]) 07/12/2012   Acute respiratory failure with hypoxia ([place]) 10/21/2019   Adjustment disorder with depressed mood 03/26/2017   AKI (acute kidney injury) ([employer])    Anemia 10/21/2019   Anemia of chronic disease 2002   Anginal pain (HCC)    Anxiety    Panic attacks   Bilateral lower extremity edema 05/13/2016   Bipolar disorder ([place])    Cataract    B/L cataract   Cellulitis 05/21/2014   right eye   CHF (congestive heart failure) ([place])    Chronic bronchitis ([place])    "get it q yr" (05/13/2013)   Chronic pain    Chronic pain of right knee 09/08/2016   Dehiscence of amputation stump ([place])    Depression    Edema of lower extremity    Endocarditis 2002   subacute bacterial endocarditis.    Fall    Family history of anesthesia complication    "my mom has a hard time coming  out from under"   Fibromyalgia    GERD (gastroesophageal reflux disease)    occ   Heart murmur    Herpes simplex type 1 infection 01/16/2018   History of blood transfusion    "just low blood count" (05/13/2013)   Hyperlipidemia    Hypertension    Hypothyroidism    Hypothyroidism, adult 03/21/2014   Hypoxia    Leukocytosis    Muscle spasm 12/16/2020   Necrosis ([place])    and ulceration   Necrotizing fasciitis s/p OR debridements 07/06/2012   Obesity    OSA on CPAP    does not wear all the time   Peripheral neuropathy    Pneumonia    Pyelonephritis 12/31/2020   Routine adult health maintenance 12/08/2016   Severe protein-calorie malnutrition ([place])    Status post below knee amputation, left ([place]) 04/11/2017   Type II diabetes mellitus ([place])    Type  II   Vaginal irritation 03/14/2016   Weak pulse 07/21/2021   Wound dehiscence 09/04/2019   Past Surgical History:  Procedure Laterality Date   A/V FISTULAGRAM Left 05/12/2021   Procedure: A/V FISTULAGRAM;  Surgeon: Broadus John, MD;  Location: [place] [facility];  Service: Cardiovascular;  Laterality: Left;   ABDOMINAL AORTOGRAM W/LOWER EXTREMITY Left 03/19/2019   Procedure: ABDOMINAL AORTOGRAM W/LOWER EXTREMITY;  Surgeon: Serafina Mitchell, MD;  Location: [place] [facility];  Service: Cardiovascular;  Laterality: Left;   ABOVE KNEE LEG AMPUTATION Left 10/11/2019   wound dehisence    AIR/FLUID EXCHANGE Right 12/31/2019   Procedure: AIR/FLUID EXCHANGE;  Surgeon: Jalene Mullet, MD;  Location: [place];  Service: Ophthalmology;  Laterality: Right;   AMPUTATION Left 04/07/2017   Procedure: LEFT BELOW KNEE AMPUTATION;  Surgeon: Newt Minion, MD;  Location: [place];  Service: Orthopedics;  Laterality: Left;   AMPUTATION Left 10/11/2019   Procedure: LEFT ABOVE KNEE AMPUTATION;  Surgeon: Newt Minion, MD;  Location: [place];  Service: Orthopedics;  Laterality: Left;   AV FISTULA PLACEMENT Left 01/05/2021   Procedure: ARTERIOVENOUS (AV)  FISTULA CREATION vs. GRAFT;  Surgeon: Angelia Mould, MD;  [facility];  Service: Vascular;  Laterality: Left;   CATARACT EXTRACTION W/PHACO Right 02/11/2020   Procedure: CATARACT EXTRACTION PHACO AND INTRAOCULAR LENS PLACEMENT ([place]);  Surgeon: Jalene Mullet, MD;  Location: [place];  Service: Ophthalmology;  Laterality: Right;   EYE SURGERY     lazer   FISTULA SUPERFICIALIZATION Left 02/23/2021   Procedure: SUPERFICIALIZATION OF LEFT BRACHIOCEPHALIC FISTULA;  Surgeon: Angelia Mould, MD;  Location: [place];  Service: Vascular;  Laterality: Left;   GAS INSERTION Right 12/31/2019   Procedure: INSERTION OF GAS;  Surgeon: Jalene Mullet, MD;  Location: [place];  Service: Ophthalmology;  Laterality: Right;   INCISION AND DRAINAGE ABSCESS     multiple I&Ds   INCISION AND DRAINAGE ABSCESS Left 07/09/2012   Procedure: DRESSING CHANGE, THIGH WOUND;  Surgeon: Harl Bowie, MD;  Location: [place];  Service: General;  Laterality: Left;   INCISION AND DRAINAGE OF WOUND Left 07/07/2012   Procedure: IRRIGATION AND DEBRIDEMENT WOUND;  Surgeon: Harl Bowie, MD;  Location: [place];  Service: General;  Laterality: Left;   INCISION AND DRAINAGE PERIRECTAL ABSCESS Left 07/14/2012   Procedure: DEBRIDEMENT OF SKIN & SOFT TISSUE; DRESSING CHANGE UNDER ANESTHESIA;  Surgeon: Gayland Curry, MD,FACS;  Location: [place];  Service: General;  Laterality: Left;   INCISION AND DRAINAGE PERIRECTAL ABSCESS Left 07/16/2012   Procedure: I&D Left Thigh;  Surgeon: Gwenyth Ober, MD;  Location: [place];  Service: General;  Laterality: Left;   INCISION AND DRAINAGE PERIRECTAL ABSCESS N/A 01/05/2015   Procedure: IRRIGATION AND DEBRIDEMENT PERIRECTAL ABSCESS;  Surgeon: Donnie Mesa, MD;  Location: [place];  Service: General;  Laterality: N/A;   INSERTION OF DIALYSIS CATHETER Right 01/05/2021   Procedure: INSERTION OF DIALYSIS CATHETER;  Surgeon: Angelia Mould, MD;  Location: [place];  Service: Vascular;   Laterality: Right;   IRRIGATION AND DEBRIDEMENT ABSCESS Left 07/06/2012   Procedure: IRRIGATION AND DEBRIDEMENT ABSCESS BUTTOCKS AND THIGH;  Surgeon: Shann Medal, MD;  Location: [place];  Service: General;  Laterality: Left;   IRRIGATION AND DEBRIDEMENT ABSCESS Left 08/10/2012   Procedure: IRRIGATION AND DEBRIDEMENT ABSCESS;  Surgeon: Madilyn Hook, DO;  Location: [place];  Service: General;  Laterality: Left;   PARS PLANA VITRECTOMY Right 12/31/2019   Procedure: PARS PLANA VITRECTOMY WITH 25 GAUGE;  Surgeon: Jalene Mullet, MD;  Location: [place];  Service: Ophthalmology;  Laterality: Right;   PERIPHERAL VASCULAR INTERVENTION Left 05/12/2021   Procedure: PERIPHERAL VASCULAR INTERVENTION;  Surgeon: Broadus John, MD;  Location: [place] [facility];  Service: Cardiovascular;  Laterality: Left;  arm fistula   PHOTOCOAGULATION WITH LASER Right 12/31/2019   Procedure: PHOTOCOAGULATION WITH LASER;  Surgeon: Jalene Mullet, MD;  Location: [place];  Service: Ophthalmology;  Laterality: Right;   REPAIR OF COMPLEX TRACTION RETINAL DETACHMENT Right 12/31/2019   Procedure: REPAIR OF COMPLEX TRACTION RETINAL;  Surgeon: Jalene Mullet, MD;  Location: [place];  Service: Ophthalmology;  Laterality: Right;   STUMP REVISION Left 06/09/2017   Procedure: REVISION LEFT BELOW KNEE AMPUTATION;  Surgeon: Newt Minion, MD;  Location: [place];  Service: Orthopedics;  Laterality: Left;   STUMP REVISION Left 09/04/2019   Procedure: LEFT BELOW KNEE AMPUTATION REVISION;  Surgeon: Newt Minion, MD;  Location: [place];  Service: Orthopedics;  Laterality: Left;   STUMP REVISION Left 09/20/2019   Procedure: LEFT BELOW KNEE AMPUTATION REVISION;  Surgeon: Newt Minion, MD;  Location: [place];  Service: Orthopedics;  Laterality: Left;   TONSILLECTOMY  1994   Patient Active Problem List   Diagnosis Date Noted   Urgency of urination 09/12/2021   Hidradenitis suppurativa 02/24/2021   Cough 05/26/2020   Chronic pain syndrome  01/16/2020   Hx of AKA (above knee amputation), left (HCC)    Blurry vision, right eye 10/08/2019   Tobacco use 03/01/2019   Phantom limb pain ([place]) 11/08/2018   Current moderate episode of major depressive disorder (HCC)    Hyperkalemia    Anemia of chronic disease    Bipolar affective disorder ([place])    Heart failure with preserved ejection fraction ([place]) 02/09/2017   Healthcare maintenance 12/08/2016   End-stage renal disease on hemodialysis ([place]) 07/15/2016   OSA (obstructive sleep apnea) 05/13/2016   Morbid obesity due to excess calories ([place]) 11/27/2015   Subclinical hypothyroidism 04/25/2013   Type 2 diabetes mellitus ([place]) 07/12/2012   Carpal tunnel syndrome, bilateral 01/25/2012   Diabetic polyneuropathy associated with type 2 diabetes mellitus ([place]) 07/04/2011   Anxiety and depression 06/02/2011   GERD 12/06/2007   Hyperlipidemia 08/29/2007   Hypertension associated with diabetes ([place]) 08/29/2007    REFERRING DIAG: U20.254 (ICD-10-CM) - Hx of AKA (above knee amputation), left (HCC)    THERAPY DIAG:  Other abnormalities of gait and mobility  Muscle weakness (generalized)  Unsteadiness on feet  PERTINENT HISTORY: ESRD, HTN, Chronic Pain, s/p left AKA    PRECAUTIONS: Fall, on dialysis T/Th/Sat. Fistula upper left arm   SUBJECTIVE: Patient reports feeling tired lately and not feeling up to do anything. Has an appt with PCP on Monday and plans to have them look at her limb again as they are the ones who prescribed the cream. Has a rail on her stairs at home now and is able to walk up/down them on her own, no longer having to bump up/down!!! Missed the other day due to transportation coming at wrong time. Pt advised to call next time to let us know.   PAIN:  Are you having pain? No. Pre-medicated prior to coming in today.     TODAY'S TREATMENT:   CURRENT PROSTHETIC WEAR ASSESSMENT: Donning prosthesis: Modified independence Doffing prosthesis: Modified  independence Prosthetic wear tolerance: daily all awake hours.  Residual limb condition: continues with the white circular area at distal end of limb. Continues to be itchy. Has been using cream that PCP gave her that is not helping to clear it up, on her 3rd tube.     GAIT: Gait pattern: step through pattern, decreased stride length, circumduction- Left, and trunk flexed Distance walked: around clinic with session Assistive device utilized: Gilford Rile -  2 wheeled, Crutches, and prosthesis Level of assistance: SBA, CGA, and Min A Comments: cues for decreased circumduction with gait to fully engage prosthetic knee Gait speed: 12.65 seconds= 2.60 ft/sec's with walker/prosthesis  SELF CARE: Pt reports doing HEP as able at home. Limited due to HD 3 days a week and taking daughter to appointments/etc. Reports current program is still challenging. Getting to it ~3-4 days a week. Also now walking more. No longer using W/C at [facility] when going to PCP or with grocery shopping.  Discussed pt's feeling of fatigue and general feeling of not wanting to do much. Pt thinks her depression may be flaring up. On medication for this and does see a Teacher, early years/pre. Pt has appt with PCP on Monday. PTA encouraged pt to discuss these feeling with her MD on Monday.      [facility] PT Assessment - 12/03/21 0957       Berg Balance Test   Sit to Stand Able to stand without using hands and stabilize independently    Standing Unsupported Able to stand safely 2 minutes    Sitting with Back Unsupported but Feet Supported on Floor or Stool Able to sit safely and securely 2 minutes    Stand to Sit Sits safely with minimal use of hands    Transfers Able to transfer safely, minor use of hands    Standing Unsupported with Eyes Closed Able to stand 10 seconds safely    Standing Unsupported with Feet Together Able to place feet together independently and stand 1 minute safely    From Standing, Reach Forward with Outstretched Arm Can  reach confidently >25 cm (10")    From Standing Position, Pick up Object from Floor Able to pick up shoe safely and easily    From Standing Position, Turn to Look Behind Over each Shoulder Looks behind from both sides and weight shifts well    Turn 360 Degrees Able to turn 360 degrees safely but slowly   >8 seconds both ways   Standing Unsupported, Alternately Place Feet on Step/Stool Able to complete >2 steps/needs minimal assist    Standing Unsupported, One Foot in Front Able to plae foot ahead of the other independently and hold 30 seconds    Standing on One Leg Able to lift leg independently and hold 5-10 seconds    Total Score 49    Berg comment: 49/56 moderate risk              PATIENT EDUCATION: Education details: progress toward goals checked today and plan to check remaining goals next visit Person educated: Patient Education method: Explanation Education comprehension: verbalized understanding     HOME EXERCISE PROGRAM: Access Code: [email or web address] URL: [email or web address] Date: 09/10/2021 Prepared by: Cherly Anderson  Exercises - Supine Lower Trunk Rotation  - 2 x daily - 7 x weekly - 1 sets - 4 reps - 20 sec hold - Single Knee to Chest Stretch  - 2 x daily - 7 x weekly - 1 sets - 4 reps - 20 sec hold - Modified Thomas Stretch  - 2 x daily - 7 x weekly - 1 sets - 3 reps - 30 sec hold - Sit to Stand  - 2 x daily - 5 x weekly - 2 sets - 5 reps - Standing Hip Abduction with Counter Support  - 1 x daily - 5 x weekly - 2 sets - 10 reps  12/03/21: Emphasized doing these to improve balance reactions  - Tapping bottom of cabinet at  sink  - 1 x daily - 5 x weekly - 2 sets - 10 reps - Standing Balance in Corner with Eyes Closed  - 2 x daily - 5 x weekly - 1 sets - 3 reps - 20 sec hold - Head turns side to side and up/down  - 2 x daily - 5 x weekly - 1 sets - 3 reps - 20 sec hold - Semi-Tandem Corner Balance With Eyes Open  - 2 x daily - 5 x weekly - 1 sets - 2 reps -  20 sec hold        GOALS: Goals reviewed with patient? Yes   SHORT TERM GOALS: Target date: 11/08/2021   Pt will be independent with continued HEP for strengthening, balance and aquatic program. Baseline: Has initial HEP, Will plan to start aquatic program soon Goal status: MET   2.  Pt will decrease TUG to <28 secs for improved balance and decreased fall risk. Baseline: 10/29/21: 26 seconds  Goal status: MET   3.  Pt will be independent with prosthetic management when receives new socket for improved function. Baseline: 11/03/21: met with new socket Goal status: MET   4.  Pt will be able to ambulate >400' on varied level surfaces (including simulated carpet) with walker while initiating knee flexion in prosthesis for improved mobility. Baseline: 11/03/21: 500 feet on varied surfaces (paved, grass, gravel)  Goal status: MET   5. Pt will improve BERG balance score to 46/56 in order to indicate dec fall risk.   Baseline: 11/03/21: 48/56 scored today  Goal status: MET  6. Pt will ambulate 150' with single loftstrand crutch vs quad tip cane at S level in order to indicate more independent household ambulation.   Baseline: 11/05/21: 115 feet with forearm crutch with min guard assist, improved from 115' with crutch at min A level  Goal status: PARTIALLY MET         LONG TERM GOALS: Target date:  12/06/21   Pt will be independent with progressive HEP for strengthening, balance and aerobic activity to continue gains on own. Baseline:12/03/21:  Goal status: MET    2.  Pt will ambulate 200' with cane versus loftstrand crutch on unlevel short community distances at S level.  Baseline: 10/01/21: gait with cane has been initiated in prior sessions, not to goal level; 115' with [place] cane with CGA Goal status: PROGRESSING   3.  Pt will increase gait speed to 2.62 ft/sec w/ LRAD for improved mobility. Baseline: 12/03/21: 2.60 ft/sec with RW/prosthesis, improved from 2.07 ft/sec just not to  goal level Goal status: PARTIALLY MET   4.  Pt will increase Berg to >/= 50/56 for improved balance and decreased fall risk. Baseline: 49/56 scored today, improved from 42/56 scored today Goal status: PARTIALLY MET   5.  Pt will negotiate 4 stairs w/ B crutches, up/down curb/ramp with single crutch vs cane at S level in order to indicate safe community mobility.   Baseline: min A at this time with RW  Goal Status: NEW    ASSESSMENT:   CLINICAL IMPRESSION:  Skilled session focused on progress toward LTGs with goal #1 met however will benefit from advancement of HEP as pt progresses and partially met goals #3 and #4 for gait speed and Berg Balance test. Will plan to check remaining goals at next session for recert vs hold till follow up with MD for limb care, see's PT next for this assessment.    OBJECTIVE IMPAIRMENTS Abnormal  gait, decreased activity tolerance, decreased balance, decreased knowledge of use of DME, decreased mobility, decreased strength, prosthetic dependency , and pain.    ACTIVITY LIMITATIONS cleaning and community activity.    PERSONAL FACTORS Time since onset of injury/illness/exacerbation and 3+ comorbidities:  DM2, obesity, and bipolar disorder, HTN, CHF, heart murmur, peripheral neuropathy, ESRD on dialysis  are also affecting patient's functional outcome.      REHAB POTENTIAL: Good   CLINICAL DECISION MAKING: Evolving/moderate complexity   EVALUATION COMPLEXITY: Moderate         PLAN: PT FREQUENCY: 2x/week   PT DURATION: 8 weeks   PLANNED INTERVENTIONS: Therapeutic exercises, Therapeutic activity, Neuromuscular re-education, Balance training, Gait training, Patient/Family education, Joint mobilization, Stair training, Prosthetic training, DME instructions, Aquatic Therapy, Cryotherapy, Moist heat, and Manual therapy   PLAN FOR NEXT SESSION:  . Check remaining [place], [place], [place] [address], [place], [place] [address] [phone] 12/03/21, 4:12 PM

## 2021-12-04 DIAGNOSIS — D689 Coagulation defect, unspecified: Secondary | ICD-10-CM | POA: Diagnosis not present

## 2021-12-04 DIAGNOSIS — E1122 Type 2 diabetes mellitus with diabetic chronic kidney disease: Secondary | ICD-10-CM | POA: Diagnosis not present

## 2021-12-04 DIAGNOSIS — D631 Anemia in chronic kidney disease: Secondary | ICD-10-CM | POA: Diagnosis not present

## 2021-12-04 DIAGNOSIS — Z992 Dependence on renal dialysis: Secondary | ICD-10-CM | POA: Diagnosis not present

## 2021-12-04 DIAGNOSIS — L299 Pruritus, unspecified: Secondary | ICD-10-CM | POA: Diagnosis not present

## 2021-12-04 DIAGNOSIS — N186 End stage renal disease: Secondary | ICD-10-CM | POA: Diagnosis not present

## 2021-12-04 DIAGNOSIS — D509 Iron deficiency anemia, unspecified: Secondary | ICD-10-CM | POA: Diagnosis not present

## 2021-12-04 DIAGNOSIS — N2581 Secondary hyperparathyroidism of renal origin: Secondary | ICD-10-CM | POA: Diagnosis not present

## 2021-12-06 ENCOUNTER — Telehealth: Payer: Self-pay | Admitting: *Deleted

## 2021-12-06 ENCOUNTER — Ambulatory Visit (INDEPENDENT_AMBULATORY_CARE_PROVIDER_SITE_OTHER): Payer: Medicare Other | Admitting: *Deleted

## 2021-12-06 ENCOUNTER — Ambulatory Visit (INDEPENDENT_AMBULATORY_CARE_PROVIDER_SITE_OTHER): Payer: Medicare Other | Admitting: Student

## 2021-12-06 ENCOUNTER — Ambulatory Visit: Payer: Medicare Other | Admitting: Physical Therapy

## 2021-12-06 ENCOUNTER — Encounter: Payer: Self-pay | Admitting: *Deleted

## 2021-12-06 VITALS — BP 139/82 | HR 84 | Ht 69.5 in | Wt 287.2 lb

## 2021-12-06 VITALS — BP 139/82 | HR 84 | Wt 287.2 lb

## 2021-12-06 DIAGNOSIS — E1122 Type 2 diabetes mellitus with diabetic chronic kidney disease: Secondary | ICD-10-CM | POA: Diagnosis not present

## 2021-12-06 DIAGNOSIS — Z0000 Encounter for general adult medical examination without abnormal findings: Secondary | ICD-10-CM

## 2021-12-06 DIAGNOSIS — Z0001 Encounter for general adult medical examination with abnormal findings: Secondary | ICD-10-CM

## 2021-12-06 DIAGNOSIS — E1159 Type 2 diabetes mellitus with other circulatory complications: Secondary | ICD-10-CM | POA: Diagnosis not present

## 2021-12-06 DIAGNOSIS — N186 End stage renal disease: Secondary | ICD-10-CM

## 2021-12-06 DIAGNOSIS — F458 Other somatoform disorders: Secondary | ICD-10-CM

## 2021-12-06 DIAGNOSIS — Z7985 Long-term (current) use of injectable non-insulin antidiabetic drugs: Secondary | ICD-10-CM | POA: Diagnosis not present

## 2021-12-06 DIAGNOSIS — Z794 Long term (current) use of insulin: Secondary | ICD-10-CM | POA: Diagnosis not present

## 2021-12-06 DIAGNOSIS — Z7984 Long term (current) use of oral hypoglycemic drugs: Secondary | ICD-10-CM

## 2021-12-06 DIAGNOSIS — R2681 Unsteadiness on feet: Secondary | ICD-10-CM

## 2021-12-06 DIAGNOSIS — Z89612 Acquired absence of left leg above knee: Secondary | ICD-10-CM | POA: Diagnosis not present

## 2021-12-06 DIAGNOSIS — G4733 Obstructive sleep apnea (adult) (pediatric): Secondary | ICD-10-CM

## 2021-12-06 DIAGNOSIS — R2689 Other abnormalities of gait and mobility: Secondary | ICD-10-CM | POA: Diagnosis not present

## 2021-12-06 DIAGNOSIS — M6281 Muscle weakness (generalized): Secondary | ICD-10-CM | POA: Diagnosis not present

## 2021-12-06 DIAGNOSIS — Z992 Dependence on renal dialysis: Secondary | ICD-10-CM | POA: Diagnosis not present

## 2021-12-06 DIAGNOSIS — G894 Chronic pain syndrome: Secondary | ICD-10-CM

## 2021-12-06 LAB — GLUCOSE, CAPILLARY: Glucose-Capillary: 117 mg/dL — ABNORMAL HIGH (ref 70–99)

## 2021-12-06 MED ORDER — OZEMPIC (2 MG/DOSE) 8 MG/3ML ~~LOC~~ SOPN: 2.0000 mg | PEN_INJECTOR | SUBCUTANEOUS | 2 refills | Status: DC

## 2021-12-06 MED ORDER — HYDROCORTISONE 2.5 % EX LOTN: TOPICAL_LOTION | CUTANEOUS | 0 refills | Status: DC

## 2021-12-06 MED ORDER — SEMAGLUTIDE (1 MG/DOSE) 4 MG/3ML ~~LOC~~ SOPN: 1.0000 mg | PEN_INJECTOR | SUBCUTANEOUS | 3 refills | Status: AC

## 2021-12-06 MED ORDER — HYDROCORTISONE 2.5 % EX LOTN: TOPICAL_LOTION | CUTANEOUS | 0 refills | Status: AC

## 2021-12-06 MED ORDER — SEMAGLUTIDE (1 MG/DOSE) 4 MG/3ML ~~LOC~~ SOPN: 2.0000 mg | PEN_INJECTOR | SUBCUTANEOUS | 3 refills | Status: DC

## 2021-12-06 NOTE — Assessment & Plan Note (Signed)
Tolerating HD well, without acute concerns at this time.

## 2021-12-06 NOTE — Assessment & Plan Note (Signed)
Patient requests sleep study in order for insurance to cover custom  mouthguard for bruxism. We discussed her history of sleep apnea and patient stated she does not currently have a CPAP and is unwilling to use it as she says it gives her nightmares. Will plan to send in sleep study referral.

## 2021-12-06 NOTE — Progress Notes (Signed)
Attestation for Student Documentation:  I personally was present and performed or re-performed the history, physical exam and medical decision-making activities of this service and have verified that the service and findings are accurately documented in the student's note.  Of note, patient's pharmacy called in and they are out of '2mg'$  ozempic. Will keep on 1 mg until 2 mg available.   Riesa Pope, MD 12/06/2021, 10:04 PM

## 2021-12-06 NOTE — Assessment & Plan Note (Addendum)
Patient has persistent skin itching and irritation over the end of her left residual limb that appears excoriated. Patient reports that she uses lotions to keep the skin from drying. As the rash has been unresponsive to ketoconazole cream, unlikely to be a fungal infection. Symptoms and appearance of rash are most consistent with an atopic dermatitis associated with itch-scratch cycle. Recommend starting hydrocortisone 2.5% cream to relieve itching and reduce inflammation, discussed with patient to treat for 2-4 weeks and return if no improvement. Will also plan to order arm crutches for additional walking assistance.

## 2021-12-06 NOTE — Therapy (Signed)
OUTPATIENT PHYSICAL THERAPY TREATMENT NOTE  Patient Name: [name] [name] MRN: [id number] DOB:[birth year], 46 y.o., female Today's Date: 12/06/2021  PCP: Riesa Pope, MD REFERRING PROVIDER: Riesa Pope, MD    PT End of Session - 12/06/21 1411     Visit Number 25    Number of Visits 33    Date for PT Re-Evaluation 12/06/21    Authorization Type UHC Medicare/Medicaid secondary    Progress Note Due on Visit 30    PT Start Time 1404    PT Stop Time 1441    PT Time Calculation (min) 37 min    Equipment Utilized During Treatment Gait belt    Activity Tolerance Patient tolerated treatment well    Behavior During Therapy [facility] for tasks assessed/performed;Flat affect                 Past Medical History:  Diagnosis Date   Abdominal muscle pain 09/08/2016   Abnormal Pap smear of cervix 2009   Abscess    history of multiple abscesses   Acute bilateral low back pain 02/13/2017   Acute blood loss anemia    Acute on chronic renal failure ([place]) 07/12/2012   Acute renal failure ([place]) 07/12/2012   Acute respiratory failure with hypoxia ([place]) 10/21/2019   Adjustment disorder with depressed mood 03/26/2017   AKI (acute kidney injury) ([place])    Anemia 10/21/2019   Anemia of chronic disease 2002   Anginal pain (HCC)    Anxiety    Panic attacks   Bilateral lower extremity edema 05/13/2016   Bipolar disorder ([place])    Cataract    B/L cataract   Cellulitis 05/21/2014   right eye   CHF (congestive heart failure) ([place])    Chronic bronchitis ([place])    "get it q yr" (05/13/2013)   Chronic pain    Chronic pain of right knee 09/08/2016   Dehiscence of amputation stump ([place])    Depression    Edema of lower extremity    Endocarditis 2002   subacute bacterial endocarditis.    Fall    Family history of anesthesia complication    "my mom has a hard time coming out from under"   Fibromyalgia    GERD (gastroesophageal reflux disease)    occ   Heart murmur     Herpes simplex type 1 infection 01/16/2018   History of blood transfusion    "just low blood count" (05/13/2013)   Hyperlipidemia    Hypertension    Hypothyroidism    Hypothyroidism, adult 03/21/2014   Hypoxia    Leukocytosis    Muscle spasm 12/16/2020   Necrosis ([place])    and ulceration   Necrotizing fasciitis s/p OR debridements 07/06/2012   Obesity    OSA on CPAP    does not wear all the time   Peripheral neuropathy    Pneumonia    Pyelonephritis 12/31/2020   Routine adult health maintenance 12/08/2016   Severe protein-calorie malnutrition ([place])    Status post below knee amputation, left ([place]) 04/11/2017   Type II diabetes mellitus ([place])    Type  II   Vaginal irritation 03/14/2016   Weak pulse 07/21/2021   Wound dehiscence 09/04/2019   Past Surgical History:  Procedure Laterality Date   A/V FISTULAGRAM Left 05/12/2021   Procedure: A/V FISTULAGRAM;  Surgeon: Broadus John, MD;  Location: [place] [facility];  Service: Cardiovascular;  Laterality: Left;   ABDOMINAL AORTOGRAM W/LOWER EXTREMITY Left 03/19/2019   Procedure: ABDOMINAL AORTOGRAM W/LOWER EXTREMITY;  Surgeon:  Serafina Mitchell, MD;  Location: [place] [facility];  Service: Cardiovascular;  Laterality: Left;   ABOVE KNEE LEG AMPUTATION Left 10/11/2019   wound dehisence    AIR/FLUID EXCHANGE Right 12/31/2019   Procedure: AIR/FLUID EXCHANGE;  Surgeon: Jalene Mullet, MD;  Location: [place];  Service: Ophthalmology;  Laterality: Right;   AMPUTATION Left 04/07/2017   Procedure: LEFT BELOW KNEE AMPUTATION;  Surgeon: Newt Minion, MD;  Location: [place];  Service: Orthopedics;  Laterality: Left;   AMPUTATION Left 10/11/2019   Procedure: LEFT ABOVE KNEE AMPUTATION;  Surgeon: Newt Minion, MD;  Location: [place];  Service: Orthopedics;  Laterality: Left;   AV FISTULA PLACEMENT Left 01/05/2021   Procedure: ARTERIOVENOUS (AV) FISTULA CREATION vs. GRAFT;  Surgeon: Angelia Mould, MD;  [facility];  Service: Vascular;   Laterality: Left;   CATARACT EXTRACTION W/PHACO Right 02/11/2020   Procedure: CATARACT EXTRACTION PHACO AND INTRAOCULAR LENS PLACEMENT ([place]);  Surgeon: Jalene Mullet, MD;  Location: [place];  Service: Ophthalmology;  Laterality: Right;   EYE SURGERY     lazer   FISTULA SUPERFICIALIZATION Left 02/23/2021   Procedure: SUPERFICIALIZATION OF LEFT BRACHIOCEPHALIC FISTULA;  Surgeon: Angelia Mould, MD;  Location: [place];  Service: Vascular;  Laterality: Left;   GAS INSERTION Right 12/31/2019   Procedure: INSERTION OF GAS;  Surgeon: Jalene Mullet, MD;  Location: [place];  Service: Ophthalmology;  Laterality: Right;   INCISION AND DRAINAGE ABSCESS     multiple I&Ds   INCISION AND DRAINAGE ABSCESS Left 07/09/2012   Procedure: DRESSING CHANGE, THIGH WOUND;  Surgeon: Harl Bowie, MD;  Location: [place];  Service: General;  Laterality: Left;   INCISION AND DRAINAGE OF WOUND Left 07/07/2012   Procedure: IRRIGATION AND DEBRIDEMENT WOUND;  Surgeon: Harl Bowie, MD;  Location: [place];  Service: General;  Laterality: Left;   INCISION AND DRAINAGE PERIRECTAL ABSCESS Left 07/14/2012   Procedure: DEBRIDEMENT OF SKIN & SOFT TISSUE; DRESSING CHANGE UNDER ANESTHESIA;  Surgeon: Gayland Curry, MD,FACS;  Location: [place];  Service: General;  Laterality: Left;   INCISION AND DRAINAGE PERIRECTAL ABSCESS Left 07/16/2012   Procedure: I&D Left Thigh;  Surgeon: Gwenyth Ober, MD;  Location: [place];  Service: General;  Laterality: Left;   INCISION AND DRAINAGE PERIRECTAL ABSCESS N/A 01/05/2015   Procedure: IRRIGATION AND DEBRIDEMENT PERIRECTAL ABSCESS;  Surgeon: Donnie Mesa, MD;  Location: [place];  Service: General;  Laterality: N/A;   INSERTION OF DIALYSIS CATHETER Right 01/05/2021   Procedure: INSERTION OF DIALYSIS CATHETER;  Surgeon: Angelia Mould, MD;  Location: [place];  Service: Vascular;  Laterality: Right;   IRRIGATION AND DEBRIDEMENT ABSCESS Left 07/06/2012   Procedure: IRRIGATION AND  DEBRIDEMENT ABSCESS BUTTOCKS AND THIGH;  Surgeon: Shann Medal, MD;  Location: [place];  Service: General;  Laterality: Left;   IRRIGATION AND DEBRIDEMENT ABSCESS Left 08/10/2012   Procedure: IRRIGATION AND DEBRIDEMENT ABSCESS;  Surgeon: Madilyn Hook, DO;  Location: [place];  Service: General;  Laterality: Left;   PARS PLANA VITRECTOMY Right 12/31/2019   Procedure: PARS PLANA VITRECTOMY WITH 25 GAUGE;  Surgeon: Jalene Mullet, MD;  Location: [place];  Service: Ophthalmology;  Laterality: Right;   PERIPHERAL VASCULAR INTERVENTION Left 05/12/2021   Procedure: PERIPHERAL VASCULAR INTERVENTION;  Surgeon: Broadus John, MD;  Location: [place] [facility];  Service: Cardiovascular;  Laterality: Left;  arm fistula   PHOTOCOAGULATION WITH LASER Right 12/31/2019   Procedure: PHOTOCOAGULATION WITH LASER;  Surgeon: Jalene Mullet, MD;  Location:  [place] OR;  Service: Ophthalmology;  Laterality: Right;   REPAIR OF COMPLEX TRACTION RETINAL DETACHMENT Right 12/31/2019   Procedure: REPAIR OF COMPLEX TRACTION RETINAL;  Surgeon: Jalene Mullet, MD;  Location: [place];  Service: Ophthalmology;  Laterality: Right;   STUMP REVISION Left 06/09/2017   Procedure: REVISION LEFT BELOW KNEE AMPUTATION;  Surgeon: Newt Minion, MD;  Location: [place];  Service: Orthopedics;  Laterality: Left;   STUMP REVISION Left 09/04/2019   Procedure: LEFT BELOW KNEE AMPUTATION REVISION;  Surgeon: Newt Minion, MD;  Location: [place];  Service: Orthopedics;  Laterality: Left;   STUMP REVISION Left 09/20/2019   Procedure: LEFT BELOW KNEE AMPUTATION REVISION;  Surgeon: Newt Minion, MD;  Location: [place];  Service: Orthopedics;  Laterality: Left;   TONSILLECTOMY  1994   Patient Active Problem List   Diagnosis Date Noted   Urgency of urination 09/12/2021   Hidradenitis suppurativa 02/24/2021   Cough 05/26/2020   Chronic pain syndrome 01/16/2020   Hx of AKA (above knee amputation), left (HCC)    Blurry vision, right eye 10/08/2019    Tobacco use 03/01/2019   Phantom limb pain ([place]) 11/08/2018   Current moderate episode of major depressive disorder (HCC)    Hyperkalemia    Anemia of chronic disease    Bipolar affective disorder ([place])    Heart failure with preserved ejection fraction ([place]) 02/09/2017   Healthcare maintenance 12/08/2016   End-stage renal disease on hemodialysis ([place]) 07/15/2016   OSA (obstructive sleep apnea) 05/13/2016   Morbid obesity due to excess calories ([place]) 11/27/2015   Subclinical hypothyroidism 04/25/2013   Type 2 diabetes mellitus ([place]) 07/12/2012   Carpal tunnel syndrome, bilateral 01/25/2012   Diabetic polyneuropathy associated with type 2 diabetes mellitus ([place]) 07/04/2011   Anxiety and depression 06/02/2011   GERD 12/06/2007   Hyperlipidemia 08/29/2007   Hypertension associated with diabetes ([place]) 08/29/2007    REFERRING DIAG: D17.616 (ICD-10-CM) - Hx of AKA (above knee amputation), left (HCC)    THERAPY DIAG:  Other abnormalities of gait and mobility  Muscle weakness (generalized)  Unsteadiness on feet  PERTINENT HISTORY: ESRD, HTN, Chronic Pain, s/p left AKA    PRECAUTIONS: Fall, on dialysis T/Th/Sat. Fistula upper left arm   SUBJECTIVE: Patient reports she went to PCP today and it went "ok". Exercises are going well. No new changes   PAIN:  Are you having pain? Yes. Pre-medicated prior to coming in today.  Pain rating: 2/10  Pain location: LLE     TODAY'S TREATMENT:   CURRENT PROSTHETIC WEAR ASSESSMENT: Donning prosthesis: Modified independence Doffing prosthesis: Modified independence Prosthetic wear tolerance: daily all awake hours.  Residual limb condition: continues with the white circular area at distal end of limb. Continues to be itchy. Has been using cream that PCP gave her that is not helping to clear it up, on her 3rd tube.     Self-care/home management  POC  Pausing  Insurance    NMR   STAIRS:  Level of Assistance: {Levels of  assistance:24026}  Stair Negotiation Technique: {Stair Technique:27161} with {Rail Assistance:27162}  Number of Stairs: ***   Height of Stairs: ***  Comments: ***  GAIT: Gait pattern: {gait characteristics:25376} Distance walked: *** Assistive device utilized: {Assistive devices:23999} Level of assistance: {Levels of assistance:24026} Comments: ***          PATIENT EDUCATION: Education details: progress toward goals checked today and plan to check remaining goals next visit Person educated: Patient Education method: Explanation Education comprehension: verbalized understanding  HOME EXERCISE PROGRAM: Access Code: [email or web address] URL: [email or web address] Date: 09/10/2021 Prepared by: Cherly [place]  Exercises - Supine Lower Trunk Rotation  - 2 x daily - 7 x weekly - 1 sets - 4 reps - 20 sec hold - Single Knee to Chest Stretch  - 2 x daily - 7 x weekly - 1 sets - 4 reps - 20 sec hold - Modified Thomas Stretch  - 2 x daily - 7 x weekly - 1 sets - 3 reps - 30 sec hold - Sit to Stand  - 2 x daily - 5 x weekly - 2 sets - 5 reps - Standing Hip Abduction with Counter Support  - 1 x daily - 5 x weekly - 2 sets - 10 reps  12/03/21: Emphasized doing these to improve balance reactions  - Tapping bottom of cabinet at sink  - 1 x daily - 5 x weekly - 2 sets - 10 reps - Standing Balance in Corner with Eyes Closed  - 2 x daily - 5 x weekly - 1 sets - 3 reps - 20 sec hold - Head turns side to side and up/down  - 2 x daily - 5 x weekly - 1 sets - 3 reps - 20 sec hold - Semi-Tandem Corner Balance With Eyes Open  - 2 x daily - 5 x weekly - 1 sets - 2 reps - 20 sec hold        GOALS: Goals reviewed with patient? Yes   SHORT TERM GOALS: Target date: 11/08/2021   Pt will be independent with continued HEP for strengthening, balance and aquatic program. Baseline: Has initial HEP, Will plan to start aquatic program soon Goal status: MET   2.  Pt will decrease TUG to <28 secs  for improved balance and decreased fall risk. Baseline: 10/29/21: 26 seconds  Goal status: MET   3.  Pt will be independent with prosthetic management when receives new socket for improved function. Baseline: 11/03/21: met with new socket Goal status: MET   4.  Pt will be able to ambulate >400' on varied level surfaces (including simulated carpet) with walker while initiating knee flexion in prosthesis for improved mobility. Baseline: 11/03/21: 500 feet on varied surfaces (paved, grass, gravel)  Goal status: MET   5. Pt will improve BERG balance score to 46/56 in order to indicate dec fall risk.   Baseline: 11/03/21: 48/56 scored today  Goal status: MET  6. Pt will ambulate 150' with single loftstrand crutch vs quad tip cane at S level in order to indicate more independent household ambulation.   Baseline: 11/05/21: 115 feet with forearm crutch with min guard assist, improved from 115' with crutch at min A level  Goal status: PARTIALLY MET         LONG TERM GOALS: Target date:  12/06/21   Pt will be independent with progressive HEP for strengthening, balance and aerobic activity to continue gains on own. Baseline:12/03/21:  Goal status: MET    2.  Pt will ambulate 200' with cane versus loftstrand crutch on unlevel short community distances at S level.  Baseline: 10/01/21: gait with cane has been initiated in prior sessions, not to goal level; 115' with [place] cane with CGA Goal status: PROGRESSING   3.  Pt will increase gait speed to 2.62 ft/sec w/ LRAD for improved mobility. Baseline: 12/03/21: 2.60 ft/sec with RW/prosthesis, improved from 2.07 ft/sec just not to goal level Goal status: PARTIALLY MET   4.  Pt will increase  Berg to >/= 50/56 for improved balance and decreased fall risk. Baseline: 49/56 scored today, improved from 42/56 scored today Goal status: PARTIALLY MET   5.  Pt will negotiate 4 stairs w/ B crutches, up/down curb/ramp with single crutch vs cane at S level in  order to indicate safe community mobility.  Baseline: min A at this time with RW; stairs CGA w/L[place]strand and single rail   Goal Status: PROGRESSING    ASSESSMENT:   CLINICAL IMPRESSION:  Skilled session focused on progress toward LTGs with goal #1 met however will benefit from advancement of HEP as pt progresses and partially met goals #3 and #4 for gait speed and Berg Balance test. Will plan to check remaining goals at next session for recert vs hold till follow up with MD for limb care, see's PT next for this assessment.    OBJECTIVE IMPAIRMENTS Abnormal gait, decreased activity tolerance, decreased balance, decreased knowledge of use of DME, decreased mobility, decreased strength, prosthetic dependency , and pain.    ACTIVITY LIMITATIONS cleaning and community activity.    PERSONAL FACTORS Time since onset of injury/illness/exacerbation and 3+ comorbidities:  DM2, obesity, and bipolar disorder, HTN, CHF, heart murmur, peripheral neuropathy, ESRD on dialysis  are also affecting patient's functional outcome.      REHAB POTENTIAL: Good   CLINICAL DECISION MAKING: Evolving/moderate complexity   EVALUATION COMPLEXITY: Moderate         PLAN: PT FREQUENCY: 2x/week   PT DURATION: 8 weeks   PLANNED INTERVENTIONS: Therapeutic exercises, Therapeutic activity, Neuromuscular re-education, Balance training, Gait training, Patient/Family education, Joint mobilization, Stair training, Prosthetic training, DME instructions, Aquatic Therapy, Cryotherapy, Moist heat, and Manual therapy   PLAN FOR NEXT SESSION:  . Check remaining Gratz Dontea Corlew, PT, [facility] [address] [place], [place]  [address] Phone:  [phone] Fax:  [phone] 12/06/21, 2:43 PM

## 2021-12-06 NOTE — Progress Notes (Signed)
Subjective:   Patient ID: [name] [name] female   DOB: 05-28-1975 46 y.o.   MRN: [id number]  HPI: [name] is a 46 y.o. woman with a pmhx of T2DM, ESRD on hemodialysis, and OSA who presents for follow up for her T2DM medications, an acute complaint of skin irritation, and sleep study request.   Patient was seen last month for diabetes management, A1C was 9.6. She was discontinued on Victoza and started on Ozempic 74m and Jardiance 12m She reports that she is tolerating the Ozempic well and denies nausea or changes in her bowel movements. Patient also denies decreased appetite, but says that she has not had the urge to binge eat since starting Ozempic. She also denies any side effects on Jardiance.    Patient also reports that she has had persistent pruritis and irritation on the skin over the residual limb of her Above-Knee amputation for several months. She was seen for this issue three months ago and was prescribed ketoconazole cream, which she has consistently used without any improvement. She denies generalized pruritis, except when she is on hemodialysis. She also received a new prosthetic three months ago and reports that it fits much better. Patient reports that she has been more active and is wearing her prosthetic for the majority of the day and using her walker.   Patient is also requesting referral for a sleep study today in order for insurance to cover a custom dental mouthguard with her dentist.   Patient Active Problem List   Diagnosis Date Noted   Urgency of urination 09/12/2021   Hidradenitis suppurativa 02/24/2021   Cough 05/26/2020   Chronic pain syndrome 01/16/2020   Hx of AKA (above knee amputation), left ([facility]   Blurry vision, right eye 10/08/2019   Tobacco use 03/01/2019   Phantom limb pain ([facility]06/18/2020   Current moderate episode of major depressive disorder ([facility]   Hyperkalemia    Anemia of chronic disease    Bipolar affective disorder  ([facility]   Heart failure with preserved ejection fraction ([facility]09/20/2018   Healthcare maintenance 12/08/2016   End-stage renal disease on hemodialysis ([facility]02/23/2018   OSA (obstructive sleep apnea) 05/13/2016   Morbid obesity due to excess calories ([facility]07/11/2015   Subclinical hypothyroidism 04/25/2013   Type 2 diabetes mellitus ([facility]02/20/2014   Carpal tunnel syndrome, bilateral 01/25/2012   Diabetic polyneuropathy associated with type 2 diabetes mellitus ([facility]02/03/2012   Anxiety and depression 06/02/2011   GERD 12/06/2007   Hyperlipidemia 08/29/2007   Hypertension associated with diabetes ([facility]04/12/2007     Current Outpatient Medications  Medication Sig Dispense Refill   Semaglutide, 2 MG/DOSE, (OZEMPIC, 2 MG/DOSE,) 8 MG/3ML SOPN Inject 2 mg into the skin once a week. 3 mL 2   allopurinol (ZYLOPRIM) 100 MG tablet Take 1 tablet by mouth 3 times a week. Take only after dialysis sessions. Do not take on non-dialysis days. 30 tablet 1   amitriptyline (ELAVIL) 100 MG tablet Take 100 mg by mouth at bedtime.     amLODipine (NORVASC) 10 MG tablet Take 1 tablet (10 mg total) by mouth daily.     atorvastatin (LIPITOR) 20 MG tablet TAKE 1 TABLET BY MOUTH ONCE DAILY (Patient taking differently: Take 20 mg by mouth daily.) 90 tablet 1   B Complex-C-Folic Acid (DIALYVITE 807790.8 MG TABS Take 0.8 mg by mouth daily.     bismuth subsalicylate (PEPTO BISMOL) 262 MG/15ML suspension Take 30 mLs by mouth  every 6 (six) hours as needed for indigestion or diarrhea or loose stools.     blood glucose meter kit and supplies KIT ICD 10- E11.69. Based on patient and insurance preference. Use up to four times daily as directed. 1 each 0   buPROPion (WELLBUTRIN XL) 150 MG 24 hr tablet Take 1 tablet (150 mg total) by mouth every morning. 90 tablet 3   calcium acetate (PHOSLO) 667 MG capsule Take 1 capsule (667 mg total) by mouth 3 (three) times daily with meals. 90 capsule 1   cyclobenzaprine (FLEXERIL) 5 MG  tablet Take 1 tablet by mouth daily as needed for muscle spasms. 30 tablet 0   diclofenac Sodium (VOLTAREN) 1 % GEL APPLY 1 GRAM TOPICALLY TO AFFECTED AREA 4 TIMES DAILY AS NEEDED 423 g 2   folic acid-vitamin b complex-vitamin c-selenium-zinc (DIALYVITE) 3 MG TABS tablet Take 1 tablet by mouth daily.     furosemide (LASIX) 20 MG tablet Take 1 tablet by mouth daily. Take on non-dialysis days Mon, Wed, Friday Adjust per kidney docs 30 tablet 0   hydrocortisone 2.5 % lotion Apply to affected area 2 times daily for 2-4 weeks 60 mL 0   hydrOXYzine (ATARAX) 25 MG tablet TAKE 1/2 TABLET BY MOUTH EVERY SIX HOURS AS NEEDED FOR ANXIETY 40 tablet 0   Insulin Pen Needle (TRUEPLUS PEN NEEDLES) 31G X 5 MM MISC 1 each by Other route 3 (three) times daily. 100 each 5   ketoconazole (NIZORAL) 2 % cream Apply 1 application topically to affected area daily. 15 g 2   levothyroxine (SYNTHROID) 88 MCG tablet Take 88 mcg by mouth daily.     metoprolol succinate (TOPROL-XL) 25 MG 24 hr tablet TAKE 1 TABLET BY MOUTH EVERY DAY 90 tablet 1   mupirocin ointment (BACTROBAN) 2 % APPLY TO AFFECTED AREA(S) DAILY AS NEEDED 22 g 0   oxyCODONE-acetaminophen (PERCOCET/ROXICET) 5-325 MG tablet TAKE 1/2 TABLET BY MOUTH 2 TIMES DAILY AS NEEDED FOR SEVERE PAIN 30 tablet 0   sevelamer carbonate (RENVELA) 800 MG tablet Take 1,600 mg by mouth 3 (three) times daily with meals.     No current facility-administered medications for this visit.     Review of Systems: Pertinent items noted in HPI and remainder of comprehensive ROS otherwise negative.  Objective:   Physical Exam: Vitals:   12/06/21 1132  BP: 139/82  Pulse: 84  SpO2: 100%  Weight: 287 lb 3.2 oz (130.3 kg)   Physical Exam Vitals reviewed.  Constitutional:      General: She is not in acute distress.    Appearance: Normal appearance.  HENT:     Head: Normocephalic and atraumatic.  Cardiovascular:     Rate and Rhythm: Normal rate and regular rhythm.     Heart  sounds: Murmur heard.     No friction rub. No gallop.  Pulmonary:     Effort: Pulmonary effort is normal. No respiratory distress.     Breath sounds: Normal breath sounds. No wheezing or rales.  Abdominal:     General: Abdomen is flat. There is no distension.     Palpations: Abdomen is soft.     Tenderness: There is no abdominal tenderness.  Skin:    Comments: Well-demarcated area of hyperpigmented lichenified skin over the distal end of the left residual limb.   Neurological:     Mental Status: She is alert and oriented to person, place, and time.      Assessment & Plan:   Hx of AKA (  above knee amputation), left [facility]) Patient has persistent skin itching and irritation over the end of her left residual limb that appears excoriated. Patient reports that she uses lotions to keep the skin from drying. As the rash has been unresponsive to ketoconazole cream, unlikely to be a fungal infection. Symptoms and appearance of rash are most consistent with an atopic dermatitis associated with itch-scratch cycle. Recommend starting hydrocortisone 2.5% cream to relieve itching and reduce inflammation, discussed with patient to treat for 2-4 weeks and return if no improvement. Will also plan to order arm crutches for additional walking assistance.   Type 2 diabetes mellitus ([place]) Patient has chronic uncontrolled diabetes managed with Ozempic and Jardiance with a previous A1C last month of 9.6. Given patient is on hemodialysis, [name] is contraindicated and will plan to discontinue. Patient reports tolerating Ozempic well and appears to be beneficial for suppressing patient's starve-binge cycles despite no overall decrease in appetite. We will plan to increase to 38m and will revisit at next appointment. Next A1C due in September.     OSA (obstructive sleep apnea) Patient requests sleep study in order for insurance to cover custom  mouthguard for bruxism. We discussed her history of sleep apnea and  patient stated she does not currently have a CPAP and is unwilling to use it as she says it gives her nightmares. Will plan to send in sleep study referral.

## 2021-12-06 NOTE — Telephone Encounter (Signed)
Call from [facility] prescription written for Semaglutide  1 mg but dosing is 2 mg.  Need new prescription sent for the '2mg'$  syringes instead. RTC to pharmacy.  They do not have the 2 mg on back order would doctor consider leaving patient on the 1 mg Semaglutide until the 2 mg Semaglutide comes in.  Will need another prescription for the 1 mg if this is an option for the patient.

## 2021-12-06 NOTE — Assessment & Plan Note (Signed)
Patient has chronic uncontrolled diabetes managed with Ozempic and Jardiance with a previous A1C last month of 9.6. Given patient is on hemodialysis, [name] is contraindicated and will plan to discontinue. Patient reports tolerating Ozempic well and appears to be beneficial for suppressing patient's starve-binge cycles despite no overall decrease in appetite. We will plan to increase to '2mg'$  and will revisit at next appointment. Next A1C due in September.

## 2021-12-06 NOTE — Patient Instructions (Signed)
Thank you for seeing [name] [name] today! Please give [name] [name] a call if you have any trouble filling your medications.   Diabetes Start taking Ozempic '2mg'$ . Please call [name] [name] if you experience intolerable side effects, such as nausea or significant diarrhea.   2. Skin Irritation  Start using Hydrocortisone 2.5% cream on area of irritation twice a day for 2-4 weeks. Discontinue ketoconazole cream.   3. Sleep Study Referral has been sent for a sleep study and you should receive a call from their office for scheduling.

## 2021-12-06 NOTE — Progress Notes (Unsigned)
Subjective:   [name] [name] is a 46 y.o. female who presents for an Initial Medicare Annual Wellness Visit.  Review of Systems    Defer to pcp        Objective:    There were no vitals filed for this visit. There is no height or weight on file to calculate BMI.     11/08/2021    1:13 PM 09/15/2021    4:36 PM 08/09/2021    9:37 AM 07/21/2021    9:11 AM 05/12/2021    7:50 AM 02/24/2021   10:38 AM 02/23/2021    9:24 AM  Advanced Directives  Does Patient Have a Medical Advance Directive? _0  No No  Would patient like information on creating a medical advance directive? No - Patient declined No - Patient declined No - Patient declined No - Patient declined No - Patient declined No - Patient declined     Current Medications (verified) Outpatient Encounter Medications as of 12/06/2021  Medication Sig   allopurinol (ZYLOPRIM) 100 MG tablet Take 1 tablet by mouth 3 times a week. Take only after dialysis sessions. Do not take on non-dialysis days.   amitriptyline (ELAVIL) 100 MG tablet Take 100 mg by mouth at bedtime.   amLODipine (NORVASC) 10 MG tablet Take 1 tablet (10 mg total) by mouth daily.   atorvastatin (LIPITOR) 20 MG tablet TAKE 1 TABLET BY MOUTH ONCE DAILY (Patient taking differently: Take 20 mg by mouth daily.)   B Complex-C-Folic Acid (DIALYVITE 244) 0.8 MG TABS Take 0.8 mg by mouth daily.   bismuth subsalicylate (PEPTO BISMOL) 262 MG/15ML suspension Take 30 mLs by mouth every 6 (six) hours as needed for indigestion or diarrhea or loose stools.   blood glucose meter kit and supplies KIT ICD 10- E11.69. Based on patient and insurance preference. Use up to four times daily as directed.   buPROPion (WELLBUTRIN XL) 150 MG 24 hr tablet Take 1 tablet (150 mg total) by mouth every morning.   calcium acetate (PHOSLO) 667 MG capsule Take 1 capsule (667 mg total) by mouth 3 (three) times daily with meals.   cyclobenzaprine (FLEXERIL) 5 MG tablet Take 1 tablet by mouth  daily as needed for muscle spasms.   diclofenac Sodium (VOLTAREN) 1 % GEL APPLY 1 GRAM TOPICALLY TO AFFECTED AREA 4 TIMES DAILY AS NEEDED   empagliflozin (JARDIANCE) 10 MG TABS tablet Take 1 tablet (10 mg total) by mouth daily before breakfast.   folic acid-vitamin b complex-vitamin c-selenium-zinc (DIALYVITE) 3 MG TABS tablet Take 1 tablet by mouth daily.   furosemide (LASIX) 20 MG tablet Take 1 tablet by mouth daily. Take on non-dialysis days Mon, Wed, Friday Adjust per kidney docs   hydrOXYzine (ATARAX) 25 MG tablet TAKE 1/2 TABLET BY MOUTH EVERY SIX HOURS AS NEEDED FOR ANXIETY   Insulin Pen Needle (TRUEPLUS PEN NEEDLES) 31G X 5 MM MISC 1 each by Other route 3 (three) times daily.   ketoconazole (NIZORAL) 2 % cream Apply 1 application topically to affected area daily.   levothyroxine (SYNTHROID) 88 MCG tablet Take 88 mcg by mouth daily.   metoprolol succinate (TOPROL-XL) 25 MG 24 hr tablet TAKE 1 TABLET BY MOUTH EVERY DAY   mupirocin ointment (BACTROBAN) 2 % APPLY TO AFFECTED AREA(S) DAILY AS NEEDED   oxyCODONE-acetaminophen (PERCOCET/ROXICET) 5-325 MG tablet TAKE 1/2 TABLET BY MOUTH 2 TIMES DAILY AS NEEDED FOR SEVERE PAIN   Semaglutide, 1 MG/DOSE, 4 MG/3ML SOPN Inject 1 mg into the skin once  a week.   sevelamer carbonate (RENVELA) 800 MG tablet Take 1,600 mg by mouth 3 (three) times daily with meals.   No facility-administered encounter medications on file as of 12/06/2021.    Allergies (verified) Vancomycin   History: Past Medical History:  Diagnosis Date   Abdominal muscle pain 09/08/2016   Abnormal Pap smear of cervix 2009   Abscess    history of multiple abscesses   Acute bilateral low back pain 02/13/2017   Acute blood loss anemia    Acute on chronic renal failure ([place]) 07/12/2012   Acute renal failure ([place]) 07/12/2012   Acute respiratory failure with hypoxia ([place]) 10/21/2019   Adjustment disorder with depressed mood 03/26/2017   AKI (acute kidney injury) ([place])    Anemia  10/21/2019   Anemia of chronic disease 2002   Anginal pain ([facility])    Anxiety    Panic attacks   Bilateral lower extremity edema 05/13/2016   Bipolar disorder ([place])    Cataract    B/L cataract   Cellulitis 05/21/2014   right eye   CHF (congestive heart failure) ([place])    Chronic bronchitis ([place])    "get it q yr" (05/13/2013)   Chronic pain    Chronic pain of right knee 09/08/2016   Dehiscence of amputation stump ([place])    Depression    Edema of lower extremity    Endocarditis 2002   subacute bacterial endocarditis.    Fall    Family history of anesthesia complication    "my mom has a hard time coming out from under"   Fibromyalgia    GERD (gastroesophageal reflux disease)    occ   Heart murmur    Herpes simplex type 1 infection 01/16/2018   History of blood transfusion    "just low blood count" (05/13/2013)   Hyperlipidemia    Hypertension    Hypothyroidism    Hypothyroidism, adult 03/21/2014   Hypoxia    Leukocytosis    Muscle spasm 12/16/2020   Necrosis ([place])    and ulceration   Necrotizing fasciitis s/p OR debridements 07/06/2012   Obesity    OSA on CPAP    does not wear all the time   Peripheral neuropathy    Pneumonia    Pyelonephritis 12/31/2020   Routine adult health maintenance 12/08/2016   Severe protein-calorie malnutrition ([place])    Status post below knee amputation, left ([place]) 04/11/2017   Type II diabetes mellitus ([place])    Type  II   Vaginal irritation 03/14/2016   Weak pulse 07/21/2021   Wound dehiscence 09/04/2019   Past Surgical History:  Procedure Laterality Date   A/V FISTULAGRAM Left 05/12/2021   Procedure: A/V FISTULAGRAM;  Surgeon: Broadus John, MD;  Location: [facility];  Service: Cardiovascular;  Laterality: Left;   ABDOMINAL AORTOGRAM W/LOWER EXTREMITY Left 03/19/2019   Procedure: ABDOMINAL AORTOGRAM W/LOWER EXTREMITY;  Surgeon: Serafina Mitchell, MD;  Location: [facility];  Service: Cardiovascular;  Laterality: Left;    ABOVE KNEE LEG AMPUTATION Left 10/11/2019   wound dehisence    AIR/FLUID EXCHANGE Right 12/31/2019   Procedure: AIR/FLUID EXCHANGE;  Surgeon: Jalene Mullet, MD;  Location: [facility];  Service: Ophthalmology;  Laterality: Right;   AMPUTATION Left 04/07/2017   Procedure: LEFT BELOW KNEE AMPUTATION;  Surgeon: Newt Minion, MD;  Location: [facility];  Service: Orthopedics;  Laterality: Left;   AMPUTATION Left 10/11/2019   Procedure: LEFT ABOVE KNEE AMPUTATION;  Surgeon: Newt Minion, MD;  Location: [facility]  OR;  Service: Orthopedics;  Laterality: Left;   AV FISTULA PLACEMENT Left 01/05/2021   Procedure: ARTERIOVENOUS (AV) FISTULA CREATION vs. GRAFT;  Surgeon: Angelia Mould, MD;  [facility];  Service: Vascular;  Laterality: Left;   CATARACT EXTRACTION W/PHACO Right 02/11/2020   Procedure: CATARACT EXTRACTION PHACO AND INTRAOCULAR LENS PLACEMENT ([place]);  Surgeon: Jalene Mullet, MD;  Location: [place];  Service: Ophthalmology;  Laterality: Right;   EYE SURGERY     lazer   FISTULA SUPERFICIALIZATION Left 02/23/2021   Procedure: SUPERFICIALIZATION OF LEFT BRACHIOCEPHALIC FISTULA;  Surgeon: Angelia Mould, MD;  Location: [place];  Service: Vascular;  Laterality: Left;   GAS INSERTION Right 12/31/2019   Procedure: INSERTION OF GAS;  Surgeon: Jalene Mullet, MD;  Location: [facility];  Service: Ophthalmology;  Laterality: Right;   INCISION AND DRAINAGE ABSCESS     multiple I&Ds   INCISION AND DRAINAGE ABSCESS Left 07/09/2012   Procedure: DRESSING CHANGE, THIGH WOUND;  Surgeon: Harl Bowie, MD;  Location: [place];  Service: General;  Laterality: Left;   INCISION AND DRAINAGE OF WOUND Left 07/07/2012   Procedure: IRRIGATION AND DEBRIDEMENT WOUND;  Surgeon: Harl Bowie, MD;  Location: [place];  Service: General;  Laterality: Left;   INCISION AND DRAINAGE PERIRECTAL ABSCESS Left 07/14/2012   Procedure: DEBRIDEMENT OF SKIN & SOFT TISSUE; DRESSING CHANGE UNDER ANESTHESIA;  Surgeon: Gayland Curry, MD,FACS;  Location: [place];  Service: General;  Laterality: Left;   INCISION AND DRAINAGE PERIRECTAL ABSCESS Left 07/16/2012   Procedure: I&D Left Thigh;  Surgeon: Gwenyth Ober, MD;  Location: [place];  Service: General;  Laterality: Left;   INCISION AND DRAINAGE PERIRECTAL ABSCESS N/A 01/05/2015   Procedure: IRRIGATION AND DEBRIDEMENT PERIRECTAL ABSCESS;  Surgeon: Donnie Mesa, MD;  Location: [place];  Service: General;  Laterality: N/A;   INSERTION OF DIALYSIS CATHETER Right 01/05/2021   Procedure: INSERTION OF DIALYSIS CATHETER;  Surgeon: Angelia Mould, MD;  Location: [place];  Service: Vascular;  Laterality: Right;   IRRIGATION AND DEBRIDEMENT ABSCESS Left 07/06/2012   Procedure: IRRIGATION AND DEBRIDEMENT ABSCESS BUTTOCKS AND THIGH;  Surgeon: Shann Medal, MD;  Location: [place] [facility];  Service: General;  Laterality: Left;   IRRIGATION AND DEBRIDEMENT ABSCESS Left 08/10/2012   Procedure: IRRIGATION AND DEBRIDEMENT ABSCESS;  Surgeon: Madilyn Hook, DO;  Location: [place];  Service: General;  Laterality: Left;   PARS PLANA VITRECTOMY Right 12/31/2019   Procedure: PARS PLANA VITRECTOMY WITH 25 GAUGE;  Surgeon: Jalene Mullet, MD;  Location: [place];  Service: Ophthalmology;  Laterality: Right;   PERIPHERAL VASCULAR INTERVENTION Left 05/12/2021   Procedure: PERIPHERAL VASCULAR INTERVENTION;  Surgeon: Broadus John, MD;  Location: [facility];  Service: Cardiovascular;  Laterality: Left;  arm fistula   PHOTOCOAGULATION WITH LASER Right 12/31/2019   Procedure: PHOTOCOAGULATION WITH LASER;  Surgeon: Jalene Mullet, MD;  Location: [place];  Service: Ophthalmology;  Laterality: Right;   REPAIR OF COMPLEX TRACTION RETINAL DETACHMENT Right 12/31/2019   Procedure: REPAIR OF COMPLEX TRACTION RETINAL;  Surgeon: Jalene Mullet, MD;  Location: [place];  Service: Ophthalmology;  Laterality: Right;   STUMP REVISION Left 06/09/2017   Procedure: REVISION LEFT BELOW KNEE AMPUTATION;  Surgeon: Newt Minion, MD;  Location: [facility];  Service: Orthopedics;  Laterality: Left;   STUMP REVISION Left 09/04/2019   Procedure: LEFT BELOW KNEE AMPUTATION REVISION;  Surgeon: Newt Minion, MD;  Location: [facility];  Service: Orthopedics;  Laterality: Left;   STUMP  REVISION Left 09/20/2019   Procedure: LEFT BELOW KNEE AMPUTATION REVISION;  Surgeon: Newt Minion, MD;  Location: [place];  Service: Orthopedics;  Laterality: Left;   TONSILLECTOMY  1994   Family History  Problem Relation Age of Onset   Heart failure Mother    Diabetes Mother    Kidney disease Mother    Kidney disease Father    Diabetes Father    Diabetes Paternal Grandmother    Heart failure Paternal Grandmother    Other Other    Social History   Socioeconomic History   Marital status: Single    Spouse name: Not on file   Number of children: 0   Years of education: 11   Highest education level: Not on file  Occupational History   Occupation: disability  Tobacco Use   Smoking status: Every Day    Packs/day: 0.50    Years: 16.00    Total pack years: 8.00    Types: Cigarettes, E-cigarettes    Last attempt to quit: 07/22/2019    Years since quitting: 2.3   Smokeless tobacco: Never   Tobacco comments:    Hasn't had any in 3 days 2-4 times a week   Vaping Use   Vaping Use: Former   Devices: CBD oil  Substance and Sexual Activity   Alcohol use: Yes    Alcohol/week: 0.0 standard drinks of alcohol    Comment: social   Drug use: Yes    Types: Marijuana    Comment: 1 time a week   Sexual activity: Yes  Other Topics Concern   Not on file  Social History Narrative   Lives with mother currently, goddaughter and uncle in a one story home.     On disability for diabetes, neuropathy, sleep apnea etc.  Disability since 2014.    Education: 11th grade.    Social Determinants of Health   Financial Resource Strain: Not on file  Food Insecurity: Not on file  Transportation Needs: Not on file  Physical Activity: Not on file   Stress: Not on file  Social Connections: Not on file    Tobacco Counseling Ready to quit: Not Answered Counseling given: Not Answered Tobacco comments: Hasn't had any in 3 days 2-4 times a week    Clinical Intake:        Diabetic?yes Nutrition Risk Assessment:  Has the patient had any N/V/D within the last 2 months?  No  Does the patient have any non-healing wounds?  No  Has the patient had any unintentional weight loss or weight gain?  Yes   Diabetes:  Is the patient diabetic?  Yes  If diabetic, was a CBG obtained today?  Yes  Did the patient bring in their glucometer from home?  No  How often do you monitor your CBG's? Does not test .   Financial Strains and Diabetes Management:  Are you having any financial strains with the device, your supplies or your medication?  On fixed income .  Does the patient want to be seen by Chronic Care Management for management of their diabetes?  No  Would the patient like to be referred to a Nutritionist or for Diabetic Management?  No   Diabetic Exams:  Diabetic Eye Exam: Overdue for diabetic eye exam. Pt has been advised about the importance in completing this exam. Patient advised to call and schedule an eye exam. Diabetic Foot Exam: Completed 07/21/2021           Activities of Daily Living  11/08/2021    1:12 PM 09/15/2021    2:55 PM  In your present state of health, do you have any difficulty performing the following activities:  Hearing? 0 0  Vision? 0 1  Difficulty concentrating or making decisions? 0 0  Walking or climbing stairs? 1 1  Comment Prosthesis   Dressing or bathing? 0 0  Doing errands, shopping? 1 1    Patient Care Team: Riesa Pope, MD as PCP - [place] any recent Medical Services you may have received from other than [facility] providers in the past year (date may be approximate).     Assessment:   This is a routine wellness examination for  [place].  Hearing/Vision screen No results found.  Dietary issues and exercise activities discussed:     Goals Addressed   None   Depression Screen    11/08/2021    1:22 PM 09/15/2021    4:39 PM 07/21/2021    9:10 AM 12/15/2020    3:22 PM 10/02/2020    9:58 AM 09/10/2020   10:46 AM 01/16/2020    4:32 PM  PHQ 2/9 Scores  PHQ - 2 Score 2 0 0 0 0 0 0  PHQ- 9 Score 2  0 0 0 0     Fall Risk    11/08/2021    1:12 PM 09/15/2021    2:55 PM 07/21/2021    9:10 AM 02/24/2021    4:51 PM 12/15/2020    3:22 PM  Fall Risk   Falls in the past year? _0 Number falls in past yr: 0 0 0 0 1  Injury with Fall? 0 0 0 1 0  Risk for fall due to : History of fall(s);Impaired balance/gait;Impaired mobility History of fall(s);Impaired balance/gait;Impaired mobility Impaired balance/gait History of fall(s);Impaired mobility;Impaired balance/gait Impaired balance/gait  Follow up Falls evaluation completed;Falls prevention discussed Falls evaluation completed Falls evaluation completed;Falls prevention discussed Falls evaluation completed     FALL RISK PREVENTION PERTAINING TO THE HOME:  Any stairs in or around the home? Yes  If so, are there any without handrails? Yes  Home free of loose throw rugs in walkways, pet beds, electrical cords, etc? No  Adequate lighting in your home to reduce risk of falls? Yes   ASSISTIVE DEVICES UTILIZED TO PREVENT FALLS:  Life alert? No  Use of a cane, walker or w/c? Yes  has walker and prosthesis  Grab bars in the bathroom? No  Shower chair or bench in shower? Yes  Elevated toilet seat or a handicapped toilet? Yes   TIMED UP AND GO:  Was the test performed? No .  Length of time to ambulate 10 feet: 0 sec.   Gait steady and slow with use of assistive devices  Cognitive Function:        Immunizations Immunization History  Administered Date(s) Administered   Influenza Split 05/05/2011   Influenza,inj,Quad PF,6+ Mos 04/25/2013, 02/03/2014,  08/13/2015, 02/08/2016, 02/09/2017, 06/28/2018, 02/28/2019   Pneumococcal Polysaccharide-23 01/02/2012, 07/07/2012   Tdap 09/07/2017    TDAP status: Up to date  Flu Vaccine status: Up to date  Pneumococcal vaccine status: Up to date   Qualifies for Shingles Vaccine? No   Zostavax completed No   Shingrix Completed?: No.    Education has been provided regarding the importance of this vaccine. Patient has been advised to call insurance company to determine out of pocket expense if they have not yet received this vaccine. Advised may also  receive vaccine at local pharmacy or Health Dept. Verbalized acceptance and understanding.  Screening Tests Health Maintenance  Topic Date Due   COVID-19 Vaccine (1) Never done   COLONOSCOPY (Pts 45-62yr Insurance coverage will need to be confirmed)  Never done   URINE MICROALBUMIN  09/10/2021   INFLUENZA VACCINE  12/21/2021   HEMOGLOBIN A1C  02/08/2022   OPHTHALMOLOGY EXAM  03/11/2022   FOOT EXAM  07/22/2022   COLON CANCER SCREENING ANNUAL FOBT  09/16/2022   LIPID PANEL  11/09/2022   PAP SMEAR-Modifier  09/10/2025   TETANUS/TDAP  09/08/2027   Hepatitis C Screening  Completed   HIV Screening  Completed   HPV VACCINES  Aged Out    Health Maintenance  Health Maintenance Due  Topic Date Due   COVID-19 Vaccine (1) Never done   COLONOSCOPY (Pts 45-456yrInsurance coverage will need to be confirmed)  Never done   URINE MICROALBUMIN  09/10/2021    Colorectal cancer screening: Type of screening: FOBT/FIT. Completed 09/15/21. Repeat every 1 years  Mammogram status: Ordered 12/06/2021. Pt provided with contact info and advised to call to schedule appt.    Lung Cancer Screening: (Low Dose CT Chest recommended if Age 45-80ears, 30 pack-year currently smoking OR have quit w/in 15years.) does not qualify.   Lung Cancer Screening Referral: no  Additional Screening:  Hepatitis C Screening: does not qualify; Completed 09/06/2012  Vision  Screening: Recommended annual ophthalmology exams for early detection of glaucoma and other disorders of the eye. Is the patient up to date with their annual eye exam?  Yes  Who is the provider or what is the name of the office in which the patient attends annual eye exams? [facility], would they like to be referred to a provider to establish care?  N/A .   Dental Screening: Recommended annual dental exams for proper oral hygiene  Community Resource Referral / Chronic Care Management: CRR required this visit?  No   CCM required this visit?  No      Plan:     I have personally reviewed and noted the following in the patient's chart:   Medical and social history Use of alcohol, tobacco or illicit drugs  Current medications and supplements including opioid prescriptions. Patient is currently taking opioid prescriptions. Information provided to patient regarding non-opioid alternatives. Patient advised to discuss non-opioid treatment plan with their provider. Functional ability and status Nutritional status Physical activity Advanced directives List of other physicians Hospitalizations, surgeries, and ER visits in previous 12 months Vitals Screenings to include cognitive, depression, and falls Referrals and appointments  In addition, I have reviewed and discussed with patient certain preventive protocols, quality metrics, and best practice recommendations. A written personalized care plan for preventive services as well as general preventive health recommendations were provided to patient.     [place]icoletta [place] 12/06/2021   Nurse Notes: face to face 20 minutes  Ms. Brunetti , Thank you for taking time to come for your Medicare Wellness Visit. I appreciate your ongoing commitment to your health goals. Please review the following plan we discussed and let me know if I can assist you in the future.   These are the goals we  discussed:  Goals      Blood Pressure < 130/80     HEMOGLOBIN A1C < 7.0     LDL CALC < 70     Weight < 200 lb (90.719 kg)  This is a list of the screening recommended for you and due dates:  Health Maintenance  Topic Date Due   COVID-19 Vaccine (1) Never done   Colon Cancer Screening  Never done   Urine Protein Check  09/10/2021   Flu Shot  12/21/2021   Hemoglobin A1C  02/08/2022   Eye exam for diabetics  03/11/2022   Complete foot exam   07/22/2022   Stool Blood Test  09/16/2022   Lipid (cholesterol) test  11/09/2022   Pap Smear  09/10/2025   Tetanus Vaccine  09/08/2027   Hepatitis C Screening: USPSTF Recommendation to screen - Ages 18-79 yo.  Completed   HIV Screening  Completed   HPV Vaccine  Aged Out

## 2021-12-06 NOTE — Assessment & Plan Note (Signed)
Continues to have chronic pain on regimen of percocet 5-325 mg. Discussed need to avoid hire doses with her ESRD state. We also discussed if pain not managed on this regimen can consider referral to pain specialist, she will consider this.

## 2021-12-06 NOTE — Assessment & Plan Note (Deleted)
Patient requests sleep study in order for insurance to cover custom  mouthguard for bruxism. Will send in referral.

## 2021-12-06 NOTE — Patient Instructions (Signed)

## 2021-12-07 DIAGNOSIS — D509 Iron deficiency anemia, unspecified: Secondary | ICD-10-CM | POA: Diagnosis not present

## 2021-12-07 DIAGNOSIS — E1122 Type 2 diabetes mellitus with diabetic chronic kidney disease: Secondary | ICD-10-CM | POA: Diagnosis not present

## 2021-12-07 DIAGNOSIS — N2581 Secondary hyperparathyroidism of renal origin: Secondary | ICD-10-CM | POA: Diagnosis not present

## 2021-12-07 DIAGNOSIS — L299 Pruritus, unspecified: Secondary | ICD-10-CM | POA: Diagnosis not present

## 2021-12-07 DIAGNOSIS — N186 End stage renal disease: Secondary | ICD-10-CM | POA: Diagnosis not present

## 2021-12-07 DIAGNOSIS — D689 Coagulation defect, unspecified: Secondary | ICD-10-CM | POA: Diagnosis not present

## 2021-12-07 DIAGNOSIS — Z992 Dependence on renal dialysis: Secondary | ICD-10-CM | POA: Diagnosis not present

## 2021-12-07 DIAGNOSIS — D631 Anemia in chronic kidney disease: Secondary | ICD-10-CM | POA: Diagnosis not present

## 2021-12-07 NOTE — Progress Notes (Signed)
I discussed the AWV findings with the provider who conducted the visit. I was present in the office suite and immediately available to provide assistance and direction throughout the time the service was provided.  ?

## 2021-12-08 ENCOUNTER — Other Ambulatory Visit: Payer: Self-pay | Admitting: Student

## 2021-12-09 DIAGNOSIS — Z992 Dependence on renal dialysis: Secondary | ICD-10-CM | POA: Diagnosis not present

## 2021-12-09 DIAGNOSIS — D631 Anemia in chronic kidney disease: Secondary | ICD-10-CM | POA: Diagnosis not present

## 2021-12-09 DIAGNOSIS — N186 End stage renal disease: Secondary | ICD-10-CM | POA: Diagnosis not present

## 2021-12-09 DIAGNOSIS — D689 Coagulation defect, unspecified: Secondary | ICD-10-CM | POA: Diagnosis not present

## 2021-12-09 DIAGNOSIS — D509 Iron deficiency anemia, unspecified: Secondary | ICD-10-CM | POA: Diagnosis not present

## 2021-12-09 DIAGNOSIS — E1122 Type 2 diabetes mellitus with diabetic chronic kidney disease: Secondary | ICD-10-CM | POA: Diagnosis not present

## 2021-12-09 DIAGNOSIS — L299 Pruritus, unspecified: Secondary | ICD-10-CM | POA: Diagnosis not present

## 2021-12-09 DIAGNOSIS — N2581 Secondary hyperparathyroidism of renal origin: Secondary | ICD-10-CM | POA: Diagnosis not present

## 2021-12-10 ENCOUNTER — Other Ambulatory Visit: Payer: Self-pay

## 2021-12-10 NOTE — Patient Outreach (Signed)
[facility]) Care Management  12/10/2021  [name] [name] [birth year] [id number]   Telephone call to patient for nurse call. Discussed purpose for call. Patient agreeable to services. Patient is a dual eligible patient.  Advised that insurance program nurse may take over.  She verbalized understanding.   Care Plan : RN CM Plan of Care  Updates made by Jon Billings, RN since 12/10/2021 12:00 AM     Problem: Chronic Disease Management and Care Coordination Needs  Diabetes   Priority: High     Long-Range Goal: Development of Plan of Care for management of Diabetes   Start Date: 12/10/2021  Expected End Date: 05/22/2022  Priority: High  Note:   Current Barriers:  Chronic Disease Management support and education needs related to DMII   RNCM Clinical Goal(s):  Patient will verbalize basic understanding of  DMII disease process and self health management plan as evidenced by A1c Less Than 9  through collaboration with RN Care manager, provider, and care team.   Interventions: Education and support related to diabetes Inter-disciplinary care team collaboration (see longitudinal plan of care) Evaluation of current treatment plan related to  self management and patient's adherence to plan as established by provider   Diabetes Interventions:  (Status:  New goal.) Long Term Goal Assessed patient's understanding of A1c goal: <8% Provided education to patient about basic DM disease process Discussed plans with patient for ongoing care management follow up and provided patient with direct contact information for care management team Lab Results  Component Value Date   HGBA1C 9.6 (A) 11/08/2021   Patient Goals/Self-Care Activities: enter blood sugar readings and medication or insulin into daily log drink 6 to 8 glasses of water each day manage portion size  Follow Up Plan:  Telephone follow up appointment with care management team member scheduled for:  October The  patient has been provided with contact information for the care management team and has been advised to call with any health related questions or concerns.      Plan: RN CM will provide ongoing education and support to patient through phone calls.   RN CM will send welcome packet with consent to patient.   RN CM will send initial barriers letter, assessment, and care plan to primary care physician.   RN CM will contact patient October and patient agrees to next contact and care plan.  Jone Baseman, RN, MSN [facility] Care Management Care Management Coordinator Direct Line [phone] Toll Free: [phone]  Fax: [phone]

## 2021-12-10 NOTE — Patient Instructions (Signed)
Patient Goals/Self-Care Activities: enter blood sugar readings and medication or insulin into daily log drink 6 to 8 glasses of water each day manage portion size

## 2021-12-11 DIAGNOSIS — D509 Iron deficiency anemia, unspecified: Secondary | ICD-10-CM | POA: Diagnosis not present

## 2021-12-11 DIAGNOSIS — N2581 Secondary hyperparathyroidism of renal origin: Secondary | ICD-10-CM | POA: Diagnosis not present

## 2021-12-11 DIAGNOSIS — N186 End stage renal disease: Secondary | ICD-10-CM | POA: Diagnosis not present

## 2021-12-11 DIAGNOSIS — L299 Pruritus, unspecified: Secondary | ICD-10-CM | POA: Diagnosis not present

## 2021-12-11 DIAGNOSIS — Z992 Dependence on renal dialysis: Secondary | ICD-10-CM | POA: Diagnosis not present

## 2021-12-11 DIAGNOSIS — D631 Anemia in chronic kidney disease: Secondary | ICD-10-CM | POA: Diagnosis not present

## 2021-12-11 DIAGNOSIS — E1122 Type 2 diabetes mellitus with diabetic chronic kidney disease: Secondary | ICD-10-CM | POA: Diagnosis not present

## 2021-12-11 DIAGNOSIS — D689 Coagulation defect, unspecified: Secondary | ICD-10-CM | POA: Diagnosis not present

## 2021-12-13 NOTE — Progress Notes (Signed)
Internal Medicine Clinic Attending  Case and documentation of Dr. Katsadouros  at the time of the visit reviewed.  I reviewed the AWV findings.  I agree with the assessment, diagnosis, and plan of care documented in the AWV note.     

## 2021-12-13 NOTE — Progress Notes (Signed)
Internal Medicine Clinic Attending  Case discussed with Dr. Katsadouros  At the time of the visit.  We reviewed the resident's history and exam and pertinent patient test results.  I agree with the assessment, diagnosis, and plan of care documented in the resident's note.  

## 2021-12-14 ENCOUNTER — Ambulatory Visit: Payer: Self-pay

## 2021-12-14 DIAGNOSIS — D689 Coagulation defect, unspecified: Secondary | ICD-10-CM | POA: Diagnosis not present

## 2021-12-14 DIAGNOSIS — D509 Iron deficiency anemia, unspecified: Secondary | ICD-10-CM | POA: Diagnosis not present

## 2021-12-14 DIAGNOSIS — N2581 Secondary hyperparathyroidism of renal origin: Secondary | ICD-10-CM | POA: Diagnosis not present

## 2021-12-14 DIAGNOSIS — Z992 Dependence on renal dialysis: Secondary | ICD-10-CM | POA: Diagnosis not present

## 2021-12-14 DIAGNOSIS — L299 Pruritus, unspecified: Secondary | ICD-10-CM | POA: Diagnosis not present

## 2021-12-14 DIAGNOSIS — N186 End stage renal disease: Secondary | ICD-10-CM | POA: Diagnosis not present

## 2021-12-14 DIAGNOSIS — E1122 Type 2 diabetes mellitus with diabetic chronic kidney disease: Secondary | ICD-10-CM | POA: Diagnosis not present

## 2021-12-14 DIAGNOSIS — D631 Anemia in chronic kidney disease: Secondary | ICD-10-CM | POA: Diagnosis not present

## 2021-12-16 ENCOUNTER — Other Ambulatory Visit: Payer: Self-pay | Admitting: Student

## 2021-12-16 DIAGNOSIS — N2581 Secondary hyperparathyroidism of renal origin: Secondary | ICD-10-CM | POA: Diagnosis not present

## 2021-12-16 DIAGNOSIS — Z992 Dependence on renal dialysis: Secondary | ICD-10-CM | POA: Diagnosis not present

## 2021-12-16 DIAGNOSIS — F419 Anxiety disorder, unspecified: Secondary | ICD-10-CM

## 2021-12-16 DIAGNOSIS — D509 Iron deficiency anemia, unspecified: Secondary | ICD-10-CM | POA: Diagnosis not present

## 2021-12-16 DIAGNOSIS — N186 End stage renal disease: Secondary | ICD-10-CM | POA: Diagnosis not present

## 2021-12-16 DIAGNOSIS — D689 Coagulation defect, unspecified: Secondary | ICD-10-CM | POA: Diagnosis not present

## 2021-12-16 DIAGNOSIS — E1122 Type 2 diabetes mellitus with diabetic chronic kidney disease: Secondary | ICD-10-CM | POA: Diagnosis not present

## 2021-12-16 DIAGNOSIS — L299 Pruritus, unspecified: Secondary | ICD-10-CM | POA: Diagnosis not present

## 2021-12-16 DIAGNOSIS — D631 Anemia in chronic kidney disease: Secondary | ICD-10-CM | POA: Diagnosis not present

## 2021-12-18 DIAGNOSIS — Z992 Dependence on renal dialysis: Secondary | ICD-10-CM | POA: Diagnosis not present

## 2021-12-18 DIAGNOSIS — N2581 Secondary hyperparathyroidism of renal origin: Secondary | ICD-10-CM | POA: Diagnosis not present

## 2021-12-18 DIAGNOSIS — D689 Coagulation defect, unspecified: Secondary | ICD-10-CM | POA: Diagnosis not present

## 2021-12-18 DIAGNOSIS — L299 Pruritus, unspecified: Secondary | ICD-10-CM | POA: Diagnosis not present

## 2021-12-18 DIAGNOSIS — E1122 Type 2 diabetes mellitus with diabetic chronic kidney disease: Secondary | ICD-10-CM | POA: Diagnosis not present

## 2021-12-18 DIAGNOSIS — D509 Iron deficiency anemia, unspecified: Secondary | ICD-10-CM | POA: Diagnosis not present

## 2021-12-18 DIAGNOSIS — N186 End stage renal disease: Secondary | ICD-10-CM | POA: Diagnosis not present

## 2021-12-18 DIAGNOSIS — D631 Anemia in chronic kidney disease: Secondary | ICD-10-CM | POA: Diagnosis not present

## 2021-12-20 DIAGNOSIS — E1122 Type 2 diabetes mellitus with diabetic chronic kidney disease: Secondary | ICD-10-CM | POA: Diagnosis not present

## 2021-12-20 DIAGNOSIS — N186 End stage renal disease: Secondary | ICD-10-CM | POA: Diagnosis not present

## 2021-12-20 DIAGNOSIS — Z992 Dependence on renal dialysis: Secondary | ICD-10-CM | POA: Diagnosis not present

## 2021-12-21 DIAGNOSIS — Z992 Dependence on renal dialysis: Secondary | ICD-10-CM | POA: Diagnosis not present

## 2021-12-21 DIAGNOSIS — N186 End stage renal disease: Secondary | ICD-10-CM | POA: Diagnosis not present

## 2021-12-21 DIAGNOSIS — Z7689 Persons encountering health services in other specified circumstances: Secondary | ICD-10-CM | POA: Diagnosis not present

## 2021-12-21 DIAGNOSIS — D689 Coagulation defect, unspecified: Secondary | ICD-10-CM | POA: Diagnosis not present

## 2021-12-21 DIAGNOSIS — D509 Iron deficiency anemia, unspecified: Secondary | ICD-10-CM | POA: Diagnosis not present

## 2021-12-21 DIAGNOSIS — L299 Pruritus, unspecified: Secondary | ICD-10-CM | POA: Diagnosis not present

## 2021-12-21 DIAGNOSIS — N2581 Secondary hyperparathyroidism of renal origin: Secondary | ICD-10-CM | POA: Diagnosis not present

## 2021-12-21 DIAGNOSIS — D631 Anemia in chronic kidney disease: Secondary | ICD-10-CM | POA: Diagnosis not present

## 2021-12-23 DIAGNOSIS — Z7689 Persons encountering health services in other specified circumstances: Secondary | ICD-10-CM | POA: Diagnosis not present

## 2021-12-23 DIAGNOSIS — D509 Iron deficiency anemia, unspecified: Secondary | ICD-10-CM | POA: Diagnosis not present

## 2021-12-23 DIAGNOSIS — Z992 Dependence on renal dialysis: Secondary | ICD-10-CM | POA: Diagnosis not present

## 2021-12-23 DIAGNOSIS — N2581 Secondary hyperparathyroidism of renal origin: Secondary | ICD-10-CM | POA: Diagnosis not present

## 2021-12-23 DIAGNOSIS — D689 Coagulation defect, unspecified: Secondary | ICD-10-CM | POA: Diagnosis not present

## 2021-12-23 DIAGNOSIS — N186 End stage renal disease: Secondary | ICD-10-CM | POA: Diagnosis not present

## 2021-12-23 DIAGNOSIS — D631 Anemia in chronic kidney disease: Secondary | ICD-10-CM | POA: Diagnosis not present

## 2021-12-23 DIAGNOSIS — L299 Pruritus, unspecified: Secondary | ICD-10-CM | POA: Diagnosis not present

## 2021-12-27 ENCOUNTER — Telehealth: Payer: Self-pay

## 2021-12-27 NOTE — Telephone Encounter (Signed)
Pa for pt ( OXYC - ACTEAM) came through on cover my meds .Marland Kitchen But the KEY is not working sent back to pharmacy to retry and resend  new key ...      Access Your Request If you received patient information via fax, enter it exactly as it appears on the notification.   Patient Information No Matching Record The patient details and Request Key provided do not match. Please check the information and try again  . Request Key [id number]   Last Name [name] [name]    Date of Birth 1976-01-24   VIEW REQUEST

## 2021-12-28 DIAGNOSIS — D631 Anemia in chronic kidney disease: Secondary | ICD-10-CM | POA: Diagnosis not present

## 2021-12-28 DIAGNOSIS — N186 End stage renal disease: Secondary | ICD-10-CM | POA: Diagnosis not present

## 2021-12-28 DIAGNOSIS — D509 Iron deficiency anemia, unspecified: Secondary | ICD-10-CM | POA: Diagnosis not present

## 2021-12-28 DIAGNOSIS — Z992 Dependence on renal dialysis: Secondary | ICD-10-CM | POA: Diagnosis not present

## 2021-12-28 DIAGNOSIS — L299 Pruritus, unspecified: Secondary | ICD-10-CM | POA: Diagnosis not present

## 2021-12-28 DIAGNOSIS — D689 Coagulation defect, unspecified: Secondary | ICD-10-CM | POA: Diagnosis not present

## 2021-12-28 DIAGNOSIS — N2581 Secondary hyperparathyroidism of renal origin: Secondary | ICD-10-CM | POA: Diagnosis not present

## 2021-12-29 ENCOUNTER — Telehealth: Payer: Self-pay | Admitting: Physical Therapy

## 2021-12-29 DIAGNOSIS — Z89612 Acquired absence of left leg above knee: Secondary | ICD-10-CM

## 2021-12-29 NOTE — Telephone Encounter (Signed)
Dr. Johnney Ou,  We have been working with Merlene Laughter here for PT for gait/balance training with her prosthesis. We had asked for an order to get her crutches which you did place for [place], thank you. She is using Loftstrand crutches (ones that go around her forarms), not axillary crutches. The order just stated for her to get crutches and the DME company is not letting her get Loftstrand crutches based on this order. Would you be willing to place an order in Epic that specifically states "tall Loftstrand crutches" so to cover her needs.   Thank you!  Willow Ora, PTA, [place] [address], [place], [place] [address] [phone] 12/29/21, 3:13 PM

## 2021-12-30 DIAGNOSIS — Z992 Dependence on renal dialysis: Secondary | ICD-10-CM | POA: Diagnosis not present

## 2021-12-30 DIAGNOSIS — N2581 Secondary hyperparathyroidism of renal origin: Secondary | ICD-10-CM | POA: Diagnosis not present

## 2021-12-30 DIAGNOSIS — D509 Iron deficiency anemia, unspecified: Secondary | ICD-10-CM | POA: Diagnosis not present

## 2021-12-30 DIAGNOSIS — Z7689 Persons encountering health services in other specified circumstances: Secondary | ICD-10-CM | POA: Diagnosis not present

## 2021-12-30 DIAGNOSIS — D631 Anemia in chronic kidney disease: Secondary | ICD-10-CM | POA: Diagnosis not present

## 2021-12-30 DIAGNOSIS — N186 End stage renal disease: Secondary | ICD-10-CM | POA: Diagnosis not present

## 2021-12-30 DIAGNOSIS — L299 Pruritus, unspecified: Secondary | ICD-10-CM | POA: Diagnosis not present

## 2021-12-30 DIAGNOSIS — D689 Coagulation defect, unspecified: Secondary | ICD-10-CM | POA: Diagnosis not present

## 2021-12-30 NOTE — Telephone Encounter (Signed)
DME order placed for tall Loftstrand crutches. Mamie and Lauren can you please assist with this?   Thank you!  Vasili Tanisha Lutes

## 2022-01-01 DIAGNOSIS — N2581 Secondary hyperparathyroidism of renal origin: Secondary | ICD-10-CM | POA: Diagnosis not present

## 2022-01-01 DIAGNOSIS — Z7689 Persons encountering health services in other specified circumstances: Secondary | ICD-10-CM | POA: Diagnosis not present

## 2022-01-01 DIAGNOSIS — D631 Anemia in chronic kidney disease: Secondary | ICD-10-CM | POA: Diagnosis not present

## 2022-01-01 DIAGNOSIS — L299 Pruritus, unspecified: Secondary | ICD-10-CM | POA: Diagnosis not present

## 2022-01-01 DIAGNOSIS — D689 Coagulation defect, unspecified: Secondary | ICD-10-CM | POA: Diagnosis not present

## 2022-01-01 DIAGNOSIS — Z992 Dependence on renal dialysis: Secondary | ICD-10-CM | POA: Diagnosis not present

## 2022-01-01 DIAGNOSIS — D509 Iron deficiency anemia, unspecified: Secondary | ICD-10-CM | POA: Diagnosis not present

## 2022-01-01 DIAGNOSIS — N186 End stage renal disease: Secondary | ICD-10-CM | POA: Diagnosis not present

## 2022-01-04 DIAGNOSIS — Z992 Dependence on renal dialysis: Secondary | ICD-10-CM | POA: Diagnosis not present

## 2022-01-04 DIAGNOSIS — N186 End stage renal disease: Secondary | ICD-10-CM | POA: Diagnosis not present

## 2022-01-04 DIAGNOSIS — D509 Iron deficiency anemia, unspecified: Secondary | ICD-10-CM | POA: Diagnosis not present

## 2022-01-04 DIAGNOSIS — Z7689 Persons encountering health services in other specified circumstances: Secondary | ICD-10-CM | POA: Diagnosis not present

## 2022-01-04 DIAGNOSIS — N2581 Secondary hyperparathyroidism of renal origin: Secondary | ICD-10-CM | POA: Diagnosis not present

## 2022-01-04 DIAGNOSIS — D689 Coagulation defect, unspecified: Secondary | ICD-10-CM | POA: Diagnosis not present

## 2022-01-04 DIAGNOSIS — D631 Anemia in chronic kidney disease: Secondary | ICD-10-CM | POA: Diagnosis not present

## 2022-01-04 DIAGNOSIS — L299 Pruritus, unspecified: Secondary | ICD-10-CM | POA: Diagnosis not present

## 2022-01-06 DIAGNOSIS — N2581 Secondary hyperparathyroidism of renal origin: Secondary | ICD-10-CM | POA: Diagnosis not present

## 2022-01-06 DIAGNOSIS — D631 Anemia in chronic kidney disease: Secondary | ICD-10-CM | POA: Diagnosis not present

## 2022-01-06 DIAGNOSIS — L299 Pruritus, unspecified: Secondary | ICD-10-CM | POA: Diagnosis not present

## 2022-01-06 DIAGNOSIS — D509 Iron deficiency anemia, unspecified: Secondary | ICD-10-CM | POA: Diagnosis not present

## 2022-01-06 DIAGNOSIS — Z992 Dependence on renal dialysis: Secondary | ICD-10-CM | POA: Diagnosis not present

## 2022-01-06 DIAGNOSIS — D689 Coagulation defect, unspecified: Secondary | ICD-10-CM | POA: Diagnosis not present

## 2022-01-06 DIAGNOSIS — Z7689 Persons encountering health services in other specified circumstances: Secondary | ICD-10-CM | POA: Diagnosis not present

## 2022-01-06 DIAGNOSIS — N186 End stage renal disease: Secondary | ICD-10-CM | POA: Diagnosis not present

## 2022-01-08 DIAGNOSIS — D509 Iron deficiency anemia, unspecified: Secondary | ICD-10-CM | POA: Diagnosis not present

## 2022-01-08 DIAGNOSIS — N2581 Secondary hyperparathyroidism of renal origin: Secondary | ICD-10-CM | POA: Diagnosis not present

## 2022-01-08 DIAGNOSIS — D689 Coagulation defect, unspecified: Secondary | ICD-10-CM | POA: Diagnosis not present

## 2022-01-08 DIAGNOSIS — L299 Pruritus, unspecified: Secondary | ICD-10-CM | POA: Diagnosis not present

## 2022-01-08 DIAGNOSIS — Z992 Dependence on renal dialysis: Secondary | ICD-10-CM | POA: Diagnosis not present

## 2022-01-08 DIAGNOSIS — N186 End stage renal disease: Secondary | ICD-10-CM | POA: Diagnosis not present

## 2022-01-08 DIAGNOSIS — D631 Anemia in chronic kidney disease: Secondary | ICD-10-CM | POA: Diagnosis not present

## 2022-01-08 DIAGNOSIS — Z7689 Persons encountering health services in other specified circumstances: Secondary | ICD-10-CM | POA: Diagnosis not present

## 2022-01-10 ENCOUNTER — Other Ambulatory Visit: Payer: Self-pay | Admitting: Student

## 2022-01-10 DIAGNOSIS — E785 Hyperlipidemia, unspecified: Secondary | ICD-10-CM

## 2022-01-10 NOTE — Telephone Encounter (Signed)
Oxycodone Last rx written 01/16/22. Last OV 87/17/23. Next OV - has not been scheduled . TOX 02/13/20.

## 2022-01-11 ENCOUNTER — Other Ambulatory Visit: Payer: Self-pay | Admitting: Student

## 2022-01-11 DIAGNOSIS — Z7689 Persons encountering health services in other specified circumstances: Secondary | ICD-10-CM | POA: Diagnosis not present

## 2022-01-11 DIAGNOSIS — N186 End stage renal disease: Secondary | ICD-10-CM | POA: Diagnosis not present

## 2022-01-11 DIAGNOSIS — D631 Anemia in chronic kidney disease: Secondary | ICD-10-CM | POA: Diagnosis not present

## 2022-01-11 DIAGNOSIS — N2581 Secondary hyperparathyroidism of renal origin: Secondary | ICD-10-CM | POA: Diagnosis not present

## 2022-01-11 DIAGNOSIS — L299 Pruritus, unspecified: Secondary | ICD-10-CM | POA: Diagnosis not present

## 2022-01-11 DIAGNOSIS — Z992 Dependence on renal dialysis: Secondary | ICD-10-CM | POA: Diagnosis not present

## 2022-01-11 DIAGNOSIS — D689 Coagulation defect, unspecified: Secondary | ICD-10-CM | POA: Diagnosis not present

## 2022-01-11 DIAGNOSIS — D509 Iron deficiency anemia, unspecified: Secondary | ICD-10-CM | POA: Diagnosis not present

## 2022-01-11 NOTE — Telephone Encounter (Signed)
PDMP reviewed and appropriate. Needs UDS at next office visit.

## 2022-01-13 DIAGNOSIS — Z7689 Persons encountering health services in other specified circumstances: Secondary | ICD-10-CM | POA: Diagnosis not present

## 2022-01-13 DIAGNOSIS — Z992 Dependence on renal dialysis: Secondary | ICD-10-CM | POA: Diagnosis not present

## 2022-01-13 DIAGNOSIS — N2581 Secondary hyperparathyroidism of renal origin: Secondary | ICD-10-CM | POA: Diagnosis not present

## 2022-01-13 DIAGNOSIS — D689 Coagulation defect, unspecified: Secondary | ICD-10-CM | POA: Diagnosis not present

## 2022-01-13 DIAGNOSIS — L299 Pruritus, unspecified: Secondary | ICD-10-CM | POA: Diagnosis not present

## 2022-01-13 DIAGNOSIS — D631 Anemia in chronic kidney disease: Secondary | ICD-10-CM | POA: Diagnosis not present

## 2022-01-13 DIAGNOSIS — N186 End stage renal disease: Secondary | ICD-10-CM | POA: Diagnosis not present

## 2022-01-13 DIAGNOSIS — D509 Iron deficiency anemia, unspecified: Secondary | ICD-10-CM | POA: Diagnosis not present

## 2022-01-15 DIAGNOSIS — Z992 Dependence on renal dialysis: Secondary | ICD-10-CM | POA: Diagnosis not present

## 2022-01-15 DIAGNOSIS — N186 End stage renal disease: Secondary | ICD-10-CM | POA: Diagnosis not present

## 2022-01-15 DIAGNOSIS — Z7689 Persons encountering health services in other specified circumstances: Secondary | ICD-10-CM | POA: Diagnosis not present

## 2022-01-15 DIAGNOSIS — N2581 Secondary hyperparathyroidism of renal origin: Secondary | ICD-10-CM | POA: Diagnosis not present

## 2022-01-15 DIAGNOSIS — L299 Pruritus, unspecified: Secondary | ICD-10-CM | POA: Diagnosis not present

## 2022-01-15 DIAGNOSIS — D509 Iron deficiency anemia, unspecified: Secondary | ICD-10-CM | POA: Diagnosis not present

## 2022-01-15 DIAGNOSIS — D689 Coagulation defect, unspecified: Secondary | ICD-10-CM | POA: Diagnosis not present

## 2022-01-15 DIAGNOSIS — D631 Anemia in chronic kidney disease: Secondary | ICD-10-CM | POA: Diagnosis not present

## 2022-01-18 DIAGNOSIS — N2581 Secondary hyperparathyroidism of renal origin: Secondary | ICD-10-CM | POA: Diagnosis not present

## 2022-01-18 DIAGNOSIS — D509 Iron deficiency anemia, unspecified: Secondary | ICD-10-CM | POA: Diagnosis not present

## 2022-01-18 DIAGNOSIS — N186 End stage renal disease: Secondary | ICD-10-CM | POA: Diagnosis not present

## 2022-01-18 DIAGNOSIS — Z992 Dependence on renal dialysis: Secondary | ICD-10-CM | POA: Diagnosis not present

## 2022-01-18 DIAGNOSIS — D631 Anemia in chronic kidney disease: Secondary | ICD-10-CM | POA: Diagnosis not present

## 2022-01-18 DIAGNOSIS — L299 Pruritus, unspecified: Secondary | ICD-10-CM | POA: Diagnosis not present

## 2022-01-18 DIAGNOSIS — D689 Coagulation defect, unspecified: Secondary | ICD-10-CM | POA: Diagnosis not present

## 2022-01-20 ENCOUNTER — Other Ambulatory Visit: Payer: Self-pay

## 2022-01-20 DIAGNOSIS — E1122 Type 2 diabetes mellitus with diabetic chronic kidney disease: Secondary | ICD-10-CM | POA: Diagnosis not present

## 2022-01-20 DIAGNOSIS — Z7689 Persons encountering health services in other specified circumstances: Secondary | ICD-10-CM | POA: Diagnosis not present

## 2022-01-20 DIAGNOSIS — Z992 Dependence on renal dialysis: Secondary | ICD-10-CM | POA: Diagnosis not present

## 2022-01-20 DIAGNOSIS — N186 End stage renal disease: Secondary | ICD-10-CM | POA: Diagnosis not present

## 2022-01-20 DIAGNOSIS — N2581 Secondary hyperparathyroidism of renal origin: Secondary | ICD-10-CM | POA: Diagnosis not present

## 2022-01-20 DIAGNOSIS — L299 Pruritus, unspecified: Secondary | ICD-10-CM | POA: Diagnosis not present

## 2022-01-20 DIAGNOSIS — D689 Coagulation defect, unspecified: Secondary | ICD-10-CM | POA: Diagnosis not present

## 2022-01-20 DIAGNOSIS — D631 Anemia in chronic kidney disease: Secondary | ICD-10-CM | POA: Diagnosis not present

## 2022-01-20 DIAGNOSIS — D509 Iron deficiency anemia, unspecified: Secondary | ICD-10-CM | POA: Diagnosis not present

## 2022-01-20 NOTE — Patient Outreach (Signed)
[facility]) Care Management  01/20/2022  [name] [name] Oct 20, 1975 [id number]   RN CM closing case as patient in dual eligible patient.    Jone Baseman, RN, MSN [facility] Care Management Care Management Coordinator Direct Line [phone] Toll Free: [phone]  Fax: [phone]

## 2022-01-22 DIAGNOSIS — D631 Anemia in chronic kidney disease: Secondary | ICD-10-CM | POA: Diagnosis not present

## 2022-01-22 DIAGNOSIS — N2581 Secondary hyperparathyroidism of renal origin: Secondary | ICD-10-CM | POA: Diagnosis not present

## 2022-01-22 DIAGNOSIS — N186 End stage renal disease: Secondary | ICD-10-CM | POA: Diagnosis not present

## 2022-01-22 DIAGNOSIS — Z992 Dependence on renal dialysis: Secondary | ICD-10-CM | POA: Diagnosis not present

## 2022-01-22 DIAGNOSIS — R52 Pain, unspecified: Secondary | ICD-10-CM | POA: Diagnosis not present

## 2022-01-22 DIAGNOSIS — D689 Coagulation defect, unspecified: Secondary | ICD-10-CM | POA: Diagnosis not present

## 2022-01-22 DIAGNOSIS — L299 Pruritus, unspecified: Secondary | ICD-10-CM | POA: Diagnosis not present

## 2022-01-22 DIAGNOSIS — D509 Iron deficiency anemia, unspecified: Secondary | ICD-10-CM | POA: Diagnosis not present

## 2022-01-25 DIAGNOSIS — N186 End stage renal disease: Secondary | ICD-10-CM | POA: Diagnosis not present

## 2022-01-25 DIAGNOSIS — R52 Pain, unspecified: Secondary | ICD-10-CM | POA: Diagnosis not present

## 2022-01-25 DIAGNOSIS — L299 Pruritus, unspecified: Secondary | ICD-10-CM | POA: Diagnosis not present

## 2022-01-25 DIAGNOSIS — D631 Anemia in chronic kidney disease: Secondary | ICD-10-CM | POA: Diagnosis not present

## 2022-01-25 DIAGNOSIS — D689 Coagulation defect, unspecified: Secondary | ICD-10-CM | POA: Diagnosis not present

## 2022-01-25 DIAGNOSIS — Z992 Dependence on renal dialysis: Secondary | ICD-10-CM | POA: Diagnosis not present

## 2022-01-25 DIAGNOSIS — N2581 Secondary hyperparathyroidism of renal origin: Secondary | ICD-10-CM | POA: Diagnosis not present

## 2022-01-25 DIAGNOSIS — D509 Iron deficiency anemia, unspecified: Secondary | ICD-10-CM | POA: Diagnosis not present

## 2022-01-25 NOTE — Telephone Encounter (Signed)
Patient called in stating she has not received her lofstrand crutches. Gave her contact info for Willow Ora, PTA at [facility].

## 2022-01-27 DIAGNOSIS — D509 Iron deficiency anemia, unspecified: Secondary | ICD-10-CM | POA: Diagnosis not present

## 2022-01-27 DIAGNOSIS — R52 Pain, unspecified: Secondary | ICD-10-CM | POA: Diagnosis not present

## 2022-01-27 DIAGNOSIS — N186 End stage renal disease: Secondary | ICD-10-CM | POA: Diagnosis not present

## 2022-01-27 DIAGNOSIS — L299 Pruritus, unspecified: Secondary | ICD-10-CM | POA: Diagnosis not present

## 2022-01-27 DIAGNOSIS — D631 Anemia in chronic kidney disease: Secondary | ICD-10-CM | POA: Diagnosis not present

## 2022-01-27 DIAGNOSIS — Z992 Dependence on renal dialysis: Secondary | ICD-10-CM | POA: Diagnosis not present

## 2022-01-27 DIAGNOSIS — D689 Coagulation defect, unspecified: Secondary | ICD-10-CM | POA: Diagnosis not present

## 2022-01-27 DIAGNOSIS — N2581 Secondary hyperparathyroidism of renal origin: Secondary | ICD-10-CM | POA: Diagnosis not present

## 2022-01-29 DIAGNOSIS — L299 Pruritus, unspecified: Secondary | ICD-10-CM | POA: Diagnosis not present

## 2022-01-29 DIAGNOSIS — D631 Anemia in chronic kidney disease: Secondary | ICD-10-CM | POA: Diagnosis not present

## 2022-01-29 DIAGNOSIS — N2581 Secondary hyperparathyroidism of renal origin: Secondary | ICD-10-CM | POA: Diagnosis not present

## 2022-01-29 DIAGNOSIS — D689 Coagulation defect, unspecified: Secondary | ICD-10-CM | POA: Diagnosis not present

## 2022-01-29 DIAGNOSIS — Z992 Dependence on renal dialysis: Secondary | ICD-10-CM | POA: Diagnosis not present

## 2022-01-29 DIAGNOSIS — D509 Iron deficiency anemia, unspecified: Secondary | ICD-10-CM | POA: Diagnosis not present

## 2022-01-29 DIAGNOSIS — N186 End stage renal disease: Secondary | ICD-10-CM | POA: Diagnosis not present

## 2022-01-29 DIAGNOSIS — R52 Pain, unspecified: Secondary | ICD-10-CM | POA: Diagnosis not present

## 2022-01-31 DIAGNOSIS — Z89612 Acquired absence of left leg above knee: Secondary | ICD-10-CM | POA: Diagnosis not present

## 2022-02-01 DIAGNOSIS — N186 End stage renal disease: Secondary | ICD-10-CM | POA: Diagnosis not present

## 2022-02-01 DIAGNOSIS — Z992 Dependence on renal dialysis: Secondary | ICD-10-CM | POA: Diagnosis not present

## 2022-02-01 DIAGNOSIS — D631 Anemia in chronic kidney disease: Secondary | ICD-10-CM | POA: Diagnosis not present

## 2022-02-01 DIAGNOSIS — N2581 Secondary hyperparathyroidism of renal origin: Secondary | ICD-10-CM | POA: Diagnosis not present

## 2022-02-01 DIAGNOSIS — L299 Pruritus, unspecified: Secondary | ICD-10-CM | POA: Diagnosis not present

## 2022-02-01 DIAGNOSIS — D509 Iron deficiency anemia, unspecified: Secondary | ICD-10-CM | POA: Diagnosis not present

## 2022-02-01 DIAGNOSIS — D689 Coagulation defect, unspecified: Secondary | ICD-10-CM | POA: Diagnosis not present

## 2022-02-01 DIAGNOSIS — R52 Pain, unspecified: Secondary | ICD-10-CM | POA: Diagnosis not present

## 2022-02-02 ENCOUNTER — Other Ambulatory Visit: Payer: Self-pay | Admitting: Student

## 2022-02-02 DIAGNOSIS — Z89512 Acquired absence of left leg below knee: Secondary | ICD-10-CM

## 2022-02-03 ENCOUNTER — Other Ambulatory Visit: Payer: Self-pay | Admitting: Student

## 2022-02-03 DIAGNOSIS — R52 Pain, unspecified: Secondary | ICD-10-CM | POA: Diagnosis not present

## 2022-02-03 DIAGNOSIS — Z992 Dependence on renal dialysis: Secondary | ICD-10-CM | POA: Diagnosis not present

## 2022-02-03 DIAGNOSIS — E875 Hyperkalemia: Secondary | ICD-10-CM

## 2022-02-03 DIAGNOSIS — L299 Pruritus, unspecified: Secondary | ICD-10-CM | POA: Diagnosis not present

## 2022-02-03 DIAGNOSIS — D631 Anemia in chronic kidney disease: Secondary | ICD-10-CM | POA: Diagnosis not present

## 2022-02-03 DIAGNOSIS — N186 End stage renal disease: Secondary | ICD-10-CM | POA: Diagnosis not present

## 2022-02-03 DIAGNOSIS — N2581 Secondary hyperparathyroidism of renal origin: Secondary | ICD-10-CM | POA: Diagnosis not present

## 2022-02-03 DIAGNOSIS — D509 Iron deficiency anemia, unspecified: Secondary | ICD-10-CM | POA: Diagnosis not present

## 2022-02-03 DIAGNOSIS — D689 Coagulation defect, unspecified: Secondary | ICD-10-CM | POA: Diagnosis not present

## 2022-02-03 DIAGNOSIS — Z89612 Acquired absence of left leg above knee: Secondary | ICD-10-CM

## 2022-02-05 DIAGNOSIS — N186 End stage renal disease: Secondary | ICD-10-CM | POA: Diagnosis not present

## 2022-02-05 DIAGNOSIS — L299 Pruritus, unspecified: Secondary | ICD-10-CM | POA: Diagnosis not present

## 2022-02-05 DIAGNOSIS — D631 Anemia in chronic kidney disease: Secondary | ICD-10-CM | POA: Diagnosis not present

## 2022-02-05 DIAGNOSIS — N2581 Secondary hyperparathyroidism of renal origin: Secondary | ICD-10-CM | POA: Diagnosis not present

## 2022-02-05 DIAGNOSIS — Z992 Dependence on renal dialysis: Secondary | ICD-10-CM | POA: Diagnosis not present

## 2022-02-05 DIAGNOSIS — R52 Pain, unspecified: Secondary | ICD-10-CM | POA: Diagnosis not present

## 2022-02-05 DIAGNOSIS — D689 Coagulation defect, unspecified: Secondary | ICD-10-CM | POA: Diagnosis not present

## 2022-02-05 DIAGNOSIS — D509 Iron deficiency anemia, unspecified: Secondary | ICD-10-CM | POA: Diagnosis not present

## 2022-02-08 ENCOUNTER — Other Ambulatory Visit: Payer: Self-pay | Admitting: Student

## 2022-02-08 DIAGNOSIS — N2581 Secondary hyperparathyroidism of renal origin: Secondary | ICD-10-CM | POA: Diagnosis not present

## 2022-02-08 DIAGNOSIS — D509 Iron deficiency anemia, unspecified: Secondary | ICD-10-CM | POA: Diagnosis not present

## 2022-02-08 DIAGNOSIS — L299 Pruritus, unspecified: Secondary | ICD-10-CM | POA: Diagnosis not present

## 2022-02-08 DIAGNOSIS — D689 Coagulation defect, unspecified: Secondary | ICD-10-CM | POA: Diagnosis not present

## 2022-02-08 DIAGNOSIS — D631 Anemia in chronic kidney disease: Secondary | ICD-10-CM | POA: Diagnosis not present

## 2022-02-08 DIAGNOSIS — R52 Pain, unspecified: Secondary | ICD-10-CM | POA: Diagnosis not present

## 2022-02-08 DIAGNOSIS — N186 End stage renal disease: Secondary | ICD-10-CM | POA: Diagnosis not present

## 2022-02-08 DIAGNOSIS — Z992 Dependence on renal dialysis: Secondary | ICD-10-CM | POA: Diagnosis not present

## 2022-02-08 NOTE — Telephone Encounter (Signed)
Last Appointment 12/01/2021.  No UA seen.  No pending appointments.

## 2022-02-10 DIAGNOSIS — D509 Iron deficiency anemia, unspecified: Secondary | ICD-10-CM | POA: Diagnosis not present

## 2022-02-10 DIAGNOSIS — L299 Pruritus, unspecified: Secondary | ICD-10-CM | POA: Diagnosis not present

## 2022-02-10 DIAGNOSIS — D689 Coagulation defect, unspecified: Secondary | ICD-10-CM | POA: Diagnosis not present

## 2022-02-10 DIAGNOSIS — Z992 Dependence on renal dialysis: Secondary | ICD-10-CM | POA: Diagnosis not present

## 2022-02-10 DIAGNOSIS — N186 End stage renal disease: Secondary | ICD-10-CM | POA: Diagnosis not present

## 2022-02-10 DIAGNOSIS — N2581 Secondary hyperparathyroidism of renal origin: Secondary | ICD-10-CM | POA: Diagnosis not present

## 2022-02-10 DIAGNOSIS — R52 Pain, unspecified: Secondary | ICD-10-CM | POA: Diagnosis not present

## 2022-02-10 DIAGNOSIS — D631 Anemia in chronic kidney disease: Secondary | ICD-10-CM | POA: Diagnosis not present

## 2022-02-12 DIAGNOSIS — N2581 Secondary hyperparathyroidism of renal origin: Secondary | ICD-10-CM | POA: Diagnosis not present

## 2022-02-12 DIAGNOSIS — R52 Pain, unspecified: Secondary | ICD-10-CM | POA: Diagnosis not present

## 2022-02-12 DIAGNOSIS — N186 End stage renal disease: Secondary | ICD-10-CM | POA: Diagnosis not present

## 2022-02-12 DIAGNOSIS — Z992 Dependence on renal dialysis: Secondary | ICD-10-CM | POA: Diagnosis not present

## 2022-02-12 DIAGNOSIS — D689 Coagulation defect, unspecified: Secondary | ICD-10-CM | POA: Diagnosis not present

## 2022-02-12 DIAGNOSIS — D509 Iron deficiency anemia, unspecified: Secondary | ICD-10-CM | POA: Diagnosis not present

## 2022-02-12 DIAGNOSIS — L299 Pruritus, unspecified: Secondary | ICD-10-CM | POA: Diagnosis not present

## 2022-02-12 DIAGNOSIS — D631 Anemia in chronic kidney disease: Secondary | ICD-10-CM | POA: Diagnosis not present

## 2022-02-15 ENCOUNTER — Other Ambulatory Visit: Payer: Self-pay

## 2022-02-15 DIAGNOSIS — R52 Pain, unspecified: Secondary | ICD-10-CM | POA: Diagnosis not present

## 2022-02-15 DIAGNOSIS — L299 Pruritus, unspecified: Secondary | ICD-10-CM | POA: Diagnosis not present

## 2022-02-15 DIAGNOSIS — D689 Coagulation defect, unspecified: Secondary | ICD-10-CM | POA: Diagnosis not present

## 2022-02-15 DIAGNOSIS — Z992 Dependence on renal dialysis: Secondary | ICD-10-CM | POA: Diagnosis not present

## 2022-02-15 DIAGNOSIS — D509 Iron deficiency anemia, unspecified: Secondary | ICD-10-CM | POA: Diagnosis not present

## 2022-02-15 DIAGNOSIS — N2581 Secondary hyperparathyroidism of renal origin: Secondary | ICD-10-CM | POA: Diagnosis not present

## 2022-02-15 DIAGNOSIS — N186 End stage renal disease: Secondary | ICD-10-CM | POA: Diagnosis not present

## 2022-02-15 DIAGNOSIS — D631 Anemia in chronic kidney disease: Secondary | ICD-10-CM | POA: Diagnosis not present

## 2022-02-15 MED ORDER — AMLODIPINE BESYLATE 10 MG PO TABS: 10.0000 mg | ORAL_TABLET | Freq: Every day | ORAL | 1 refills | Status: DC

## 2022-02-17 DIAGNOSIS — D631 Anemia in chronic kidney disease: Secondary | ICD-10-CM | POA: Diagnosis not present

## 2022-02-17 DIAGNOSIS — N186 End stage renal disease: Secondary | ICD-10-CM | POA: Diagnosis not present

## 2022-02-17 DIAGNOSIS — R52 Pain, unspecified: Secondary | ICD-10-CM | POA: Diagnosis not present

## 2022-02-17 DIAGNOSIS — D509 Iron deficiency anemia, unspecified: Secondary | ICD-10-CM | POA: Diagnosis not present

## 2022-02-17 DIAGNOSIS — N2581 Secondary hyperparathyroidism of renal origin: Secondary | ICD-10-CM | POA: Diagnosis not present

## 2022-02-17 DIAGNOSIS — L299 Pruritus, unspecified: Secondary | ICD-10-CM | POA: Diagnosis not present

## 2022-02-17 DIAGNOSIS — Z992 Dependence on renal dialysis: Secondary | ICD-10-CM | POA: Diagnosis not present

## 2022-02-17 DIAGNOSIS — D689 Coagulation defect, unspecified: Secondary | ICD-10-CM | POA: Diagnosis not present

## 2022-02-18 ENCOUNTER — Ambulatory Visit
Payer: Medicare Other | Attending: Student in an Organized Health Care Education/Training Program | Admitting: Physical Therapy

## 2022-02-18 DIAGNOSIS — R2689 Other abnormalities of gait and mobility: Secondary | ICD-10-CM | POA: Diagnosis not present

## 2022-02-18 DIAGNOSIS — R2681 Unsteadiness on feet: Secondary | ICD-10-CM | POA: Diagnosis not present

## 2022-02-18 DIAGNOSIS — M6281 Muscle weakness (generalized): Secondary | ICD-10-CM | POA: Diagnosis not present

## 2022-02-18 DIAGNOSIS — Z89612 Acquired absence of left leg above knee: Secondary | ICD-10-CM | POA: Insufficient documentation

## 2022-02-18 NOTE — Therapy (Signed)
OUTPATIENT PHYSICAL THERAPY NEURO EVALUATION   Patient Name: [name] [name] MRN: [id number] DOB:12-26-1975, 46 y.o., female Today's Date: 02/18/2022   PCP: Riesa Pope, MD REFERRING PROVIDER: Aldine Contes, MD    PT End of Session - 02/18/22 [phone]     Visit Number 1    Number of Visits 17   Plus eval   Date for PT Re-Evaluation 04/22/22    Authorization Type UHC Medicare/Medicaid of [place]    PT Start Time [phone]    PT Stop Time 1008    PT Time Calculation (min) 35 min    Equipment Utilized During Treatment Gait belt    Activity Tolerance Patient tolerated treatment well;Patient limited by pain;Other (comment)   Pt's prosthetic not charged properly and would not bend at knee. Charged prosthetic during eval but limited time off charge to ensure it would not die prior to pt getting home   Behavior During Therapy [facility] for tasks assessed/performed             Past Medical History:  Diagnosis Date   Abdominal muscle pain 09/08/2016   Abnormal Pap smear of cervix 2009   Abscess    history of multiple abscesses   Acute bilateral low back pain 02/13/2017   Acute blood loss anemia    Acute on chronic renal failure ([place]) 07/12/2012   Acute renal failure ([place]) 07/12/2012   Acute respiratory failure with hypoxia ([place]) 10/21/2019   Adjustment disorder with depressed mood 03/26/2017   AKI (acute kidney injury) ([place])    Anemia 10/21/2019   Anemia of chronic disease 2002   Anginal pain (HCC)    Anxiety    Panic attacks   Bilateral lower extremity edema 05/13/2016   Bipolar disorder ([place])    Cataract    B/L cataract   Cellulitis 05/21/2014   right eye   CHF (congestive heart failure) ([place])    Chronic bronchitis ([place])    "get it q yr" (05/13/2013)   Chronic pain    Chronic pain of right knee 09/08/2016   Dehiscence of amputation stump ([place])    Depression    Edema of lower extremity    Endocarditis 2002   subacute bacterial endocarditis.    Fall    Family history  of anesthesia complication    "my mom has a hard time coming out from under"   Fibromyalgia    GERD (gastroesophageal reflux disease)    occ   Heart murmur    Herpes simplex type 1 infection 01/16/2018   History of blood transfusion    "just low blood count" (05/13/2013)   Hyperlipidemia    Hypertension    Hypothyroidism    Hypothyroidism, adult 03/21/2014   Hypoxia    Leukocytosis    Muscle spasm 12/16/2020   Necrosis ([place])    and ulceration   Necrotizing fasciitis s/p OR debridements 07/06/2012   Obesity    OSA on CPAP    does not wear all the time   Peripheral neuropathy    Pneumonia    Pyelonephritis 12/31/2020   Routine adult health maintenance 12/08/2016   Severe protein-calorie malnutrition ([place])    Status post below knee amputation, left ([place]) 04/11/2017   Type II diabetes mellitus ([place])    Type  II   Vaginal irritation 03/14/2016   Weak pulse 07/21/2021   Wound dehiscence 09/04/2019   Past Surgical History:  Procedure Laterality Date   A/V FISTULAGRAM Left 05/12/2021   Procedure: A/V FISTULAGRAM;  Surgeon: Broadus John, MD;  Location: [place] [facility];  Service: Cardiovascular;  Laterality: Left;   ABDOMINAL AORTOGRAM W/LOWER EXTREMITY Left 03/19/2019   Procedure: ABDOMINAL AORTOGRAM W/LOWER EXTREMITY;  Surgeon: Serafina Mitchell, MD;  Location: [place] [facility];  Service: Cardiovascular;  Laterality: Left;   ABOVE KNEE LEG AMPUTATION Left 10/11/2019   wound dehisence    AIR/FLUID EXCHANGE Right 12/31/2019   Procedure: AIR/FLUID EXCHANGE;  Surgeon: Jalene Mullet, MD;  Location: [place];  Service: Ophthalmology;  Laterality: Right;   AMPUTATION Left 04/07/2017   Procedure: LEFT BELOW KNEE AMPUTATION;  Surgeon: Newt Minion, MD;  Location: [place];  Service: Orthopedics;  Laterality: Left;   AMPUTATION Left 10/11/2019   Procedure: LEFT ABOVE KNEE AMPUTATION;  Surgeon: Newt Minion, MD;  Location: [place];  Service: Orthopedics;  Laterality: Left;   AV FISTULA  PLACEMENT Left 01/05/2021   Procedure: ARTERIOVENOUS (AV) FISTULA CREATION vs. GRAFT;  Surgeon: Angelia Mould, MD;  [facility];  Service: Vascular;  Laterality: Left;   CATARACT EXTRACTION W/PHACO Right 02/11/2020   Procedure: CATARACT EXTRACTION PHACO AND INTRAOCULAR LENS PLACEMENT ([place]);  Surgeon: Jalene Mullet, MD;  Location: [place];  Service: Ophthalmology;  Laterality: Right;   EYE SURGERY     lazer   FISTULA SUPERFICIALIZATION Left 02/23/2021   Procedure: SUPERFICIALIZATION OF LEFT BRACHIOCEPHALIC FISTULA;  Surgeon: Angelia Mould, MD;  Location: [place];  Service: Vascular;  Laterality: Left;   GAS INSERTION Right 12/31/2019   Procedure: INSERTION OF GAS;  Surgeon: Jalene Mullet, MD;  Location: [place];  Service: Ophthalmology;  Laterality: Right;   INCISION AND DRAINAGE ABSCESS     multiple I&Ds   INCISION AND DRAINAGE ABSCESS Left 07/09/2012   Procedure: DRESSING CHANGE, THIGH WOUND;  Surgeon: Harl Bowie, MD;  Location: [place];  Service: General;  Laterality: Left;   INCISION AND DRAINAGE OF WOUND Left 07/07/2012   Procedure: IRRIGATION AND DEBRIDEMENT WOUND;  Surgeon: Harl Bowie, MD;  Location: [place];  Service: General;  Laterality: Left;   INCISION AND DRAINAGE PERIRECTAL ABSCESS Left 07/14/2012   Procedure: DEBRIDEMENT OF SKIN & SOFT TISSUE; DRESSING CHANGE UNDER ANESTHESIA;  Surgeon: Gayland Curry, MD,FACS;  Location: [place];  Service: General;  Laterality: Left;   INCISION AND DRAINAGE PERIRECTAL ABSCESS Left 07/16/2012   Procedure: I&D Left Thigh;  Surgeon: Gwenyth Ober, MD;  Location: [place];  Service: General;  Laterality: Left;   INCISION AND DRAINAGE PERIRECTAL ABSCESS N/A 01/05/2015   Procedure: IRRIGATION AND DEBRIDEMENT PERIRECTAL ABSCESS;  Surgeon: Donnie Mesa, MD;  Location: [place];  Service: General;  Laterality: N/A;   INSERTION OF DIALYSIS CATHETER Right 01/05/2021   Procedure: INSERTION OF DIALYSIS CATHETER;  Surgeon: Angelia Mould, MD;  Location: [place];  Service: Vascular;  Laterality: Right;   IRRIGATION AND DEBRIDEMENT ABSCESS Left 07/06/2012   Procedure: IRRIGATION AND DEBRIDEMENT ABSCESS BUTTOCKS AND THIGH;  Surgeon: Shann Medal, MD;  Location: [place];  Service: General;  Laterality: Left;   IRRIGATION AND DEBRIDEMENT ABSCESS Left 08/10/2012   Procedure: IRRIGATION AND DEBRIDEMENT ABSCESS;  Surgeon: Madilyn Hook, DO;  Location: [place];  Service: General;  Laterality: Left;   PARS PLANA VITRECTOMY Right 12/31/2019   Procedure: PARS PLANA VITRECTOMY WITH 25 GAUGE;  Surgeon: Jalene Mullet, MD;  Location: [place];  Service: Ophthalmology;  Laterality: Right;   PERIPHERAL VASCULAR INTERVENTION Left 05/12/2021   Procedure: PERIPHERAL VASCULAR INTERVENTION;  Surgeon: Broadus John, MD;  Location: [place] [facility];  Service: Cardiovascular;  Laterality: Left;  arm fistula   PHOTOCOAGULATION WITH LASER Right 12/31/2019   Procedure: PHOTOCOAGULATION WITH LASER;  Surgeon: Jalene Mullet, MD;  Location: [place];  Service: Ophthalmology;  Laterality: Right;   REPAIR OF COMPLEX TRACTION RETINAL DETACHMENT Right 12/31/2019   Procedure: REPAIR OF COMPLEX TRACTION RETINAL;  Surgeon: Jalene Mullet, MD;  Location: [place];  Service: Ophthalmology;  Laterality: Right;   STUMP REVISION Left 06/09/2017   Procedure: REVISION LEFT BELOW KNEE AMPUTATION;  Surgeon: Newt Minion, MD;  Location: [place];  Service: Orthopedics;  Laterality: Left;   STUMP REVISION Left 09/04/2019   Procedure: LEFT BELOW KNEE AMPUTATION REVISION;  Surgeon: Newt Minion, MD;  Location: [place];  Service: Orthopedics;  Laterality: Left;   STUMP REVISION Left 09/20/2019   Procedure: LEFT BELOW KNEE AMPUTATION REVISION;  Surgeon: Newt Minion, MD;  Location: [place];  Service: Orthopedics;  Laterality: Left;   TONSILLECTOMY  1994   Patient Active Problem List   Diagnosis Date Noted   Urgency of urination 09/12/2021   Hidradenitis suppurativa  02/24/2021   Cough 05/26/2020   Chronic pain syndrome 01/16/2020   Hx of AKA (above knee amputation), left (HCC)    Blurry vision, right eye 10/08/2019   Tobacco use 03/01/2019   Phantom limb pain ([place]) 11/08/2018   Current moderate episode of major depressive disorder (HCC)    Hyperkalemia    Anemia of chronic disease    Bipolar affective disorder ([place])    Heart failure with preserved ejection fraction ([place]) 02/09/2017   Healthcare maintenance 12/08/2016   End-stage renal disease on hemodialysis ([place]) 07/15/2016   OSA (obstructive sleep apnea) 05/13/2016   Morbid obesity due to excess calories ([place]) 11/27/2015   Subclinical hypothyroidism 04/25/2013   Type 2 diabetes mellitus ([place]) 07/12/2012   Carpal tunnel syndrome, bilateral 01/25/2012   Diabetic polyneuropathy associated with type 2 diabetes mellitus ([place]) 07/04/2011   Anxiety and depression 06/02/2011   GERD 12/06/2007   Hyperlipidemia 08/29/2007   Hypertension associated with diabetes ([place]) 08/29/2007    ONSET DATE: 02/03/2022   REFERRING DIAG: N05.397 (ICD-10-CM) - Hx of AKA (above knee amputation), left (HCC)   THERAPY DIAG:  Unsteadiness on feet  Other abnormalities of gait and mobility  Muscle weakness (generalized)  Rationale for Evaluation and Treatment Rehabilitation  SUBJECTIVE:                                                                                                                                                                                              SUBJECTIVE STATEMENT: Pt, who is well known to this clinic, was  received from waiting room, charging her microprocessor knee. Provided CGA while ambulating from waiting room to clinic via Lofstrand and charged L knee during eval. "I am hurting". Pt reports w/use of Lofstrand, her R knee and low back are "on fire". "I will have to find somewhere for pain management". Pt reports she has been using single Lofstrand crutch for ~2 weeks and has been in  significant pain but is determined to use it. No falls but pt states she "expects it" at any time.   Pt accompanied by: self  PERTINENT HISTORY:  ESRD, HTN, Chronic Pain, s/p left AKA     PAIN:  Are you having pain? Yes: NPRS scale: 9/10 Pain location: R knee and low back Pain description: "on fire"  PRECAUTIONS: Fall and Other: On dialysis T/Th/Sat. Fistula in L arm  WEIGHT BEARING RESTRICTIONS No  FALLS: Has patient fallen in last 6 months? No  LIVING ENVIRONMENT: Lives with: lives with their daughter and personal care aide who comes every day for 2-3 hours Lives in: House/apartment Stairs: Yes: External: 4 steps; on left going up Has following equipment at home: Environmental consultant - 2 wheeled, Wheelchair (manual), shower chair, and L AKA prosthetic , Lofstrand   PLOF: Independent with basic ADLs and Requires assistive device for independence  PATIENT GOALS "I wanna work on walking and maybe strengthening my left knee"   OBJECTIVE:   COGNITION: Overall cognitive status: Within functional limits for tasks assessed   SENSATION: Pt reports numbness/tingling in distal RLE  POSTURE: rounded shoulders, forward head, posterior pelvic tilt, and flexed trunk    LOWER EXTREMITY MMT:    MMT Right Eval Left Eval  Hip flexion 5 4  Hip extension    Hip abduction 4+ 3  Hip adduction 4+ 3  Hip internal rotation    Hip external rotation    Knee flexion 5   Knee extension 5   Ankle dorsiflexion 5   Ankle plantarflexion    Ankle inversion    Ankle eversion    (Blank rows = not tested)  BED MOBILITY:  Independent per pt  TRANSFERS: Assistive device utilized:  Lofstrand   Sit to stand: SBA Stand to sit: SBA   GAIT: Gait pattern: step through pattern, decreased arm swing- Left, decreased hip/knee flexion- Right, decreased hip/knee flexion- Left, circumduction- Left, Left hip hike, antalgic, lateral lean- Right, and wide BOS Distance walked: Various clinic distances Assistive  device utilized:  Single Lofstrand Level of assistance: CGA Comments: Pt demonstrates decreased stability and cadence w/use of Lofstrand vs RW. Noted very wide BOS w/circumduction of LLE due to L prosthetic not being charged properly (pt reports her prosthetic has not been charging properly for a while).   FUNCTIONAL TESTs:   [facility] PT Assessment - 02/18/22 1000       Transfers   Five time sit to stand comments  28.78s w/BUE support      Ambulation/Gait   Gait velocity 32.8' over 17.03s = 1.42f/s w/Lofstrand and CGA               TODAY'S TREATMENT:  Next Session    PATIENT EDUCATION: Education details: Eval findings, POC, contacting Prosthetist regarding prosthetic not charging Person educated: Patient Education method: ECustomer service managerEducation comprehension: verbalized understanding   HOME EXERCISE PROGRAM: To be reviewed from previous POC Access Code: [email or web address]    GOALS: Goals reviewed with patient? Yes  SHORT TERM GOALS: Target date: 03/18/2022  Pt will improve gait velocity to at least 2.213fs with  LRAD and S* for improved gait efficiency and endurance  Baseline: 1.92 ft/s w/Lofstrand and CGA Goal status: INITIAL  2.  Pt will improve 5 x STS to less than or equal to 23 seconds w/BUE support to demonstrate improved functional strength and transfer efficiency.   Baseline: 28.78s w/BUE support  Goal status: INITIAL  3.  2MWT to be performed and STG/LTG written  Baseline:  Goal status: INITIAL   LONG TERM GOALS: Target date: 04/15/2022  Pt will be independent with final HEP for improved strength, balance, transfers and gait.  Baseline:  Goal status: INITIAL  2.  Pt will improve 5 x STS to less than or equal to 20 seconds w/BUE support to demonstrate improved functional strength and transfer efficiency.   Baseline: 28.78s w/BUE support Goal status: INITIAL  3.  2MWT goal Baseline:  Goal status: INITIAL  4.  Pt will improve gait  velocity to at least 2.5 ft/s w/LRAD mod I for improved gait efficiency   Baseline:  Goal status: INITIAL   ASSESSMENT:  CLINICAL IMPRESSION: Patient is a 46 year old female referred to Neuro [facility] for L AKA. Pt's PMH is significant for:  ESRD, HTN, Chronic Pain. The following deficits were present during the exam: decreased strength, decreased activity tolerance, pain and decreased endurance. Based on 5x STS and gait speed, pt is an incr risk for falls. Pt very limited by R knee pain and decreased endurance. Pt would benefit from skilled PT to address these impairments and functional limitations to maximize functional mobility independence    OBJECTIVE IMPAIRMENTS Abnormal gait, decreased activity tolerance, decreased endurance, decreased knowledge of use of DME, decreased mobility, difficulty walking, decreased strength, increased edema, prosthetic dependency , obesity, and pain.   ACTIVITY LIMITATIONS carrying, lifting, squatting, stairs, transfers, locomotion level, and caring for others  PARTICIPATION LIMITATIONS: meal prep, cleaning, laundry, driving, shopping, community activity, occupation, and yard work  PERSONAL Education officer, community, Past/current experiences, Transportation, and 1-2 comorbidities: dialysis on T/Th/Sat and chronic pain of R knee  are also affecting patient's functional outcome.   REHAB POTENTIAL: Good  CLINICAL DECISION MAKING: Evolving/moderate complexity  EVALUATION COMPLEXITY: Moderate  PLAN: PT FREQUENCY: 2x/week  PT DURATION: 8 weeks  PLANNED INTERVENTIONS: Therapeutic exercises, Therapeutic activity, Neuromuscular re-education, Balance training, Gait training, Patient/Family education, Self Care, Joint mobilization, Stair training, Vestibular training, Canalith repositioning, Prosthetic training, DME instructions, Dry Needling, Cryotherapy, Moist heat, Compression bandaging, Taping, Manual therapy, and Re-evaluation  PLAN FOR NEXT SESSION: 2MWT, review HEP  from previous session and update prn, Try quad cane?    Cruzita Lederer Jered Heiny, PT, DPT 02/18/2022, 10:21 AM

## 2022-02-19 DIAGNOSIS — L299 Pruritus, unspecified: Secondary | ICD-10-CM | POA: Diagnosis not present

## 2022-02-19 DIAGNOSIS — Z992 Dependence on renal dialysis: Secondary | ICD-10-CM | POA: Diagnosis not present

## 2022-02-19 DIAGNOSIS — N2581 Secondary hyperparathyroidism of renal origin: Secondary | ICD-10-CM | POA: Diagnosis not present

## 2022-02-19 DIAGNOSIS — D509 Iron deficiency anemia, unspecified: Secondary | ICD-10-CM | POA: Diagnosis not present

## 2022-02-19 DIAGNOSIS — R52 Pain, unspecified: Secondary | ICD-10-CM | POA: Diagnosis not present

## 2022-02-19 DIAGNOSIS — N186 End stage renal disease: Secondary | ICD-10-CM | POA: Diagnosis not present

## 2022-02-19 DIAGNOSIS — D689 Coagulation defect, unspecified: Secondary | ICD-10-CM | POA: Diagnosis not present

## 2022-02-19 DIAGNOSIS — D631 Anemia in chronic kidney disease: Secondary | ICD-10-CM | POA: Diagnosis not present

## 2022-02-19 DIAGNOSIS — E1122 Type 2 diabetes mellitus with diabetic chronic kidney disease: Secondary | ICD-10-CM | POA: Diagnosis not present

## 2022-02-22 DIAGNOSIS — Z23 Encounter for immunization: Secondary | ICD-10-CM | POA: Diagnosis not present

## 2022-02-22 DIAGNOSIS — D689 Coagulation defect, unspecified: Secondary | ICD-10-CM | POA: Diagnosis not present

## 2022-02-22 DIAGNOSIS — R52 Pain, unspecified: Secondary | ICD-10-CM | POA: Diagnosis not present

## 2022-02-22 DIAGNOSIS — N186 End stage renal disease: Secondary | ICD-10-CM | POA: Diagnosis not present

## 2022-02-22 DIAGNOSIS — N2581 Secondary hyperparathyroidism of renal origin: Secondary | ICD-10-CM | POA: Diagnosis not present

## 2022-02-22 DIAGNOSIS — E1122 Type 2 diabetes mellitus with diabetic chronic kidney disease: Secondary | ICD-10-CM | POA: Diagnosis not present

## 2022-02-22 DIAGNOSIS — D509 Iron deficiency anemia, unspecified: Secondary | ICD-10-CM | POA: Diagnosis not present

## 2022-02-22 DIAGNOSIS — L299 Pruritus, unspecified: Secondary | ICD-10-CM | POA: Diagnosis not present

## 2022-02-22 DIAGNOSIS — Z992 Dependence on renal dialysis: Secondary | ICD-10-CM | POA: Diagnosis not present

## 2022-02-22 DIAGNOSIS — D631 Anemia in chronic kidney disease: Secondary | ICD-10-CM | POA: Diagnosis not present

## 2022-02-23 ENCOUNTER — Ambulatory Visit
Payer: Medicare Other | Attending: Student in an Organized Health Care Education/Training Program | Admitting: Physical Therapy

## 2022-02-23 DIAGNOSIS — M25652 Stiffness of left hip, not elsewhere classified: Secondary | ICD-10-CM | POA: Diagnosis not present

## 2022-02-23 DIAGNOSIS — Z89612 Acquired absence of left leg above knee: Secondary | ICD-10-CM | POA: Diagnosis not present

## 2022-02-23 DIAGNOSIS — M6281 Muscle weakness (generalized): Secondary | ICD-10-CM | POA: Insufficient documentation

## 2022-02-23 DIAGNOSIS — R293 Abnormal posture: Secondary | ICD-10-CM | POA: Diagnosis not present

## 2022-02-23 DIAGNOSIS — G8929 Other chronic pain: Secondary | ICD-10-CM | POA: Diagnosis not present

## 2022-02-23 DIAGNOSIS — R2681 Unsteadiness on feet: Secondary | ICD-10-CM | POA: Diagnosis not present

## 2022-02-23 DIAGNOSIS — R2689 Other abnormalities of gait and mobility: Secondary | ICD-10-CM | POA: Diagnosis not present

## 2022-02-23 DIAGNOSIS — R296 Repeated falls: Secondary | ICD-10-CM | POA: Insufficient documentation

## 2022-02-23 DIAGNOSIS — M25561 Pain in right knee: Secondary | ICD-10-CM | POA: Diagnosis not present

## 2022-02-23 NOTE — Therapy (Signed)
OUTPATIENT PHYSICAL THERAPY NEURO TREATMENT   Patient Name: [name] [name] MRN: [id number] DOB:Oct 04, 1975, 47 y.o., female Today's Date: 02/23/2022   PCP: Riesa Pope, MD REFERRING PROVIDER: Aldine Contes, MD    PT End of Session - 02/23/22 0934     Visit Number 2    Number of Visits 17   Plus eval   Date for PT Re-Evaluation 04/22/22    Authorization Type UHC Medicare/Medicaid of [place]    PT Start Time 0932    PT Stop Time 1013    PT Time Calculation (min) 41 min    Equipment Utilized During Treatment Gait belt;Other (comment)   L AKA Prosthetic   Activity Tolerance Patient tolerated treatment well    Behavior During Therapy [facility] for tasks assessed/performed              Past Medical History:  Diagnosis Date   Abdominal muscle pain 09/08/2016   Abnormal Pap smear of cervix 2009   Abscess    history of multiple abscesses   Acute bilateral low back pain 02/13/2017   Acute blood loss anemia    Acute on chronic renal failure ([place]) 07/12/2012   Acute renal failure ([place]) 07/12/2012   Acute respiratory failure with hypoxia ([place]) 10/21/2019   Adjustment disorder with depressed mood 03/26/2017   AKI (acute kidney injury) ([place])    Anemia 10/21/2019   Anemia of chronic disease 2002   Anginal pain (HCC)    Anxiety    Panic attacks   Bilateral lower extremity edema 05/13/2016   Bipolar disorder ([place])    Cataract    B/L cataract   Cellulitis 05/21/2014   right eye   CHF (congestive heart failure) ([place])    Chronic bronchitis ([place])    "get it q yr" (05/13/2013)   Chronic pain    Chronic pain of right knee 09/08/2016   Dehiscence of amputation stump ([place])    Depression    Edema of lower extremity    Endocarditis 2002   subacute bacterial endocarditis.    Fall    Family history of anesthesia complication    "my mom has a hard time coming out from under"   Fibromyalgia    GERD (gastroesophageal reflux disease)    occ   Heart murmur    Herpes simplex  type 1 infection 01/16/2018   History of blood transfusion    "just low blood count" (05/13/2013)   Hyperlipidemia    Hypertension    Hypothyroidism    Hypothyroidism, adult 03/21/2014   Hypoxia    Leukocytosis    Muscle spasm 12/16/2020   Necrosis ([place])    and ulceration   Necrotizing fasciitis s/p OR debridements 07/06/2012   Obesity    OSA on CPAP    does not wear all the time   Peripheral neuropathy    Pneumonia    Pyelonephritis 12/31/2020   Routine adult health maintenance 12/08/2016   Severe protein-calorie malnutrition ([place])    Status post below knee amputation, left ([place]) 04/11/2017   Type II diabetes mellitus ([place])    Type  II   Vaginal irritation 03/14/2016   Weak pulse 07/21/2021   Wound dehiscence 09/04/2019   Past Surgical History:  Procedure Laterality Date   A/V FISTULAGRAM Left 05/12/2021   Procedure: A/V FISTULAGRAM;  Surgeon: Broadus John, MD;  Location: [place] [facility];  Service: Cardiovascular;  Laterality: Left;   ABDOMINAL AORTOGRAM W/LOWER EXTREMITY Left 03/19/2019   Procedure: ABDOMINAL AORTOGRAM W/LOWER EXTREMITY;  Surgeon: Harold Barban  W, MD;  Location: [place] [facility];  Service: Cardiovascular;  Laterality: Left;   ABOVE KNEE LEG AMPUTATION Left 10/11/2019   wound dehisence    AIR/FLUID EXCHANGE Right 12/31/2019   Procedure: AIR/FLUID EXCHANGE;  Surgeon: Jalene Mullet, MD;  Location: [place];  Service: Ophthalmology;  Laterality: Right;   AMPUTATION Left 04/07/2017   Procedure: LEFT BELOW KNEE AMPUTATION;  Surgeon: Newt Minion, MD;  Location: [place];  Service: Orthopedics;  Laterality: Left;   AMPUTATION Left 10/11/2019   Procedure: LEFT ABOVE KNEE AMPUTATION;  Surgeon: Newt Minion, MD;  Location: [place];  Service: Orthopedics;  Laterality: Left;   AV FISTULA PLACEMENT Left 01/05/2021   Procedure: ARTERIOVENOUS (AV) FISTULA CREATION vs. GRAFT;  Surgeon: Angelia Mould, MD;  Location: [place] [facility];  Service: Vascular;  Laterality:  Left;   CATARACT EXTRACTION W/PHACO Right 02/11/2020   Procedure: CATARACT EXTRACTION PHACO AND INTRAOCULAR LENS PLACEMENT ([place]);  Surgeon: Jalene Mullet, MD;  Location: [place];  Service: Ophthalmology;  Laterality: Right;   EYE SURGERY     lazer   FISTULA SUPERFICIALIZATION Left 02/23/2021   Procedure: SUPERFICIALIZATION OF LEFT BRACHIOCEPHALIC FISTULA;  Surgeon: Angelia Mould, MD;  Location: [place];  Service: Vascular;  Laterality: Left;   GAS INSERTION Right 12/31/2019   Procedure: INSERTION OF GAS;  Surgeon: Jalene Mullet, MD;  Location: [place];  Service: Ophthalmology;  Laterality: Right;   INCISION AND DRAINAGE ABSCESS     multiple I&Ds   INCISION AND DRAINAGE ABSCESS Left 07/09/2012   Procedure: DRESSING CHANGE, THIGH WOUND;  Surgeon: Harl Bowie, MD;  Location: [place];  Service: General;  Laterality: Left;   INCISION AND DRAINAGE OF WOUND Left 07/07/2012   Procedure: IRRIGATION AND DEBRIDEMENT WOUND;  Surgeon: Harl Bowie, MD;  Location: [place];  Service: General;  Laterality: Left;   INCISION AND DRAINAGE PERIRECTAL ABSCESS Left 07/14/2012   Procedure: DEBRIDEMENT OF SKIN & SOFT TISSUE; DRESSING CHANGE UNDER ANESTHESIA;  Surgeon: Gayland Curry, MD,FACS;  Location: [place];  Service: General;  Laterality: Left;   INCISION AND DRAINAGE PERIRECTAL ABSCESS Left 07/16/2012   Procedure: I&D Left Thigh;  Surgeon: Gwenyth Ober, MD;  Location: [place];  Service: General;  Laterality: Left;   INCISION AND DRAINAGE PERIRECTAL ABSCESS N/A 01/05/2015   Procedure: IRRIGATION AND DEBRIDEMENT PERIRECTAL ABSCESS;  Surgeon: Donnie [place], MD;  Location: [place];  Service: General;  Laterality: N/A;   INSERTION OF DIALYSIS CATHETER Right 01/05/2021   Procedure: INSERTION OF DIALYSIS CATHETER;  Surgeon: Angelia Mould, MD;  Location: [place];  Service: Vascular;  Laterality: Right;   IRRIGATION AND DEBRIDEMENT ABSCESS Left 07/06/2012   Procedure: IRRIGATION AND DEBRIDEMENT ABSCESS  BUTTOCKS AND THIGH;  Surgeon: Shann Medal, MD;  Location: [place];  Service: General;  Laterality: Left;   IRRIGATION AND DEBRIDEMENT ABSCESS Left 08/10/2012   Procedure: IRRIGATION AND DEBRIDEMENT ABSCESS;  Surgeon: Madilyn [place], DO;  Location: [place];  Service: General;  Laterality: Left;   PARS PLANA VITRECTOMY Right 12/31/2019   Procedure: PARS PLANA VITRECTOMY WITH 25 GAUGE;  Surgeon: Jalene Mullet, MD;  Location: [place];  Service: Ophthalmology;  Laterality: Right;   PERIPHERAL VASCULAR INTERVENTION Left 05/12/2021   Procedure: PERIPHERAL VASCULAR INTERVENTION;  Surgeon: Broadus John, MD;  Location: [place] [facility];  Service: Cardiovascular;  Laterality: Left;  arm fistula   PHOTOCOAGULATION WITH LASER Right 12/31/2019   Procedure: PHOTOCOAGULATION WITH LASER;  Surgeon: Jalene Mullet, MD;  Location: [place];  Service: Ophthalmology;  Laterality: Right;   REPAIR OF COMPLEX TRACTION RETINAL DETACHMENT Right 12/31/2019   Procedure: REPAIR OF COMPLEX TRACTION RETINAL;  Surgeon: Jalene Mullet, MD;  Location: [place];  Service: Ophthalmology;  Laterality: Right;   STUMP REVISION Left 06/09/2017   Procedure: REVISION LEFT BELOW KNEE AMPUTATION;  Surgeon: Newt Minion, MD;  Location: [place];  Service: Orthopedics;  Laterality: Left;   STUMP REVISION Left 09/04/2019   Procedure: LEFT BELOW KNEE AMPUTATION REVISION;  Surgeon: Newt Minion, MD;  Location: [place];  Service: Orthopedics;  Laterality: Left;   STUMP REVISION Left 09/20/2019   Procedure: LEFT BELOW KNEE AMPUTATION REVISION;  Surgeon: Newt Minion, MD;  Location: [place];  Service: Orthopedics;  Laterality: Left;   TONSILLECTOMY  1994   Patient Active Problem List   Diagnosis Date Noted   Urgency of urination 09/12/2021   Hidradenitis suppurativa 02/24/2021   Cough 05/26/2020   Chronic pain syndrome 01/16/2020   Hx of AKA (above knee amputation), left (HCC)    Blurry vision, right eye 10/08/2019   Tobacco use 03/01/2019    Phantom limb pain ([place]) 11/08/2018   Current moderate episode of major depressive disorder (HCC)    Hyperkalemia    Anemia of chronic disease    Bipolar affective disorder ([place])    Heart failure with preserved ejection fraction ([place]) 02/09/2017   Healthcare maintenance 12/08/2016   End-stage renal disease on hemodialysis ([place]) 07/15/2016   OSA (obstructive sleep apnea) 05/13/2016   Morbid obesity due to excess calories ([place]) 11/27/2015   Subclinical hypothyroidism 04/25/2013   Type 2 diabetes mellitus ([place]) 07/12/2012   Carpal tunnel syndrome, bilateral 01/25/2012   Diabetic polyneuropathy associated with type 2 diabetes mellitus ([place]) 07/04/2011   Anxiety and depression 06/02/2011   GERD 12/06/2007   Hyperlipidemia 08/29/2007   Hypertension associated with diabetes ([place]) 08/29/2007    ONSET DATE: 02/03/2022   REFERRING DIAG: W11.914 (ICD-10-CM) - Hx of AKA (above knee amputation), left (HCC)   THERAPY DIAG:  Unsteadiness on feet  Other abnormalities of gait and mobility  Muscle weakness (generalized)  Rationale for Evaluation and Treatment Rehabilitation  SUBJECTIVE:                                                                                                                                                                                              SUBJECTIVE STATEMENT: Pt reports she is doing okay, prosthetic is charged today. Pt premedicated so right knee pain is more manageable. No new changes   Pt accompanied by: self  PERTINENT HISTORY:  ESRD, HTN, Chronic Pain, s/p left AKA  PAIN:  Are you having pain? Yes: NPRS scale: 4-5/10 Pain location: R knee and low back Pain description: "on fire"  PRECAUTIONS: Fall and Other: On dialysis T/Th/Sat. Fistula in L arm  PLOF: Independent with basic ADLs and Requires assistive device for independence  PATIENT GOALS "I wanna work on walking and maybe strengthening my left knee"   OBJECTIVE:   TODAY'S TREATMENT:   Gait Training Gait pattern: step through pattern, decreased step length- Left, decreased stance time- Left, decreased hip/knee flexion- Left, circumduction- Left, Left hip hike, trendelenburg, lateral lean- Right, and abducted- Left Distance walked: 115' Assistive device utilized: Quad cane small base Level of assistance: CGA Comments: Practiced using QC to determine if pt feels more stable w/gait  compared to using Lofstrand. Noted pt placing cane too far anterolaterally to herself, causing her to lean too far to R side. No instability noted but pt reports she does not feel difference between Lofstrand and SBQC.    Gait pattern: step through pattern, decreased step length- Left, decreased stride length, decreased hip/knee flexion- Left, circumduction- Left, Left hip hike, trendelenburg, lateral hip instability, lateral lean- Right, and abducted- Left Distance walked: 115'  Assistive device utilized:  [facility] w/quad tip  Level of assistance: CGA Comments: Provide visual and verbal demonstration of proper sequencing w/cane and pt able to demonstrate well. However, noted genu valgus of RLE and significant hip drop during R stance phase on L side. Pt assumes windswept pelvic position to R side in sitting and in standing, but pt unable to reach midline in standing. Placed mirror in front of mirror to provide visual biofeedback on body position in standing and pt reported that in midline, she feels too much pressure on her R knee and low back. Therapist assessed pelvic height and noted that when pt in midline, her L iliac crest is much lower than the R. Encouraged pt to call prosthetist and make an appointment to lengthen prosthetic to assess if pt can achieve improved posture w/gait and reduce strain on low back/R knee.   Gait pattern: step through pattern, decreased step length- Left, decreased stride length, decreased hip/knee flexion- Left, circumduction- Left, trendelenburg, lateral lean- Right, and  abducted- Left Distance walked: Various clinic distances  Assistive device utilized:  Lofstrand on R side  Level of assistance: SBA Comments: Min cues to keep Lofstrand closer to midline, as pt tends to place the crutch too far anterolaterally to R side, facilitating R lateral lean and R shoulder shrug    PATIENT EDUCATION: Education details: making appointment w/prosthetist to lengthen prosthetic  Person educated: Patient Education method: Customer service manager Education comprehension: verbalized understanding   HOME EXERCISE PROGRAM: To be reviewed from previous POC Access Code: [email or web address]    GOALS: Goals reviewed with patient? Yes  SHORT TERM GOALS: Target date: 03/18/2022  Pt will improve gait velocity to at least 2.12f/s with LRAD and S* for improved gait efficiency and endurance  Baseline: 1.92 ft/s w/Lofstrand and CGA Goal status: INITIAL  2.  Pt will improve 5 x STS to less than or equal to 23 seconds w/BUE support to demonstrate improved functional strength and transfer efficiency.   Baseline: 28.78s w/BUE support  Goal status: INITIAL  3.  2MWT to be performed and STG/LTG written  Baseline:  Goal status: INITIAL   LONG TERM GOALS: Target date: 04/15/2022  Pt will be independent with final HEP for improved strength, balance, transfers and gait.  Baseline:  Goal status: INITIAL  2.  Pt will improve  5 x STS to less than or equal to 20 seconds w/BUE support to demonstrate improved functional strength and transfer efficiency.   Baseline: 28.78s w/BUE support Goal status: INITIAL  3.  2MWT goal Baseline:  Goal status: INITIAL  4.  Pt will improve gait velocity to at least 2.5 ft/s w/LRAD mod I for improved gait efficiency   Baseline:  Goal status: INITIAL   ASSESSMENT:  CLINICAL IMPRESSION: Emphasis of skilled PT session on gait training w/various canes to determine safest option for pt. Noted pt places AD too far anterolaterally on R side,  facilitating R truncal lean and strain on R knee. Pt reports she feels as though her prosthetic is too short and she has to lean to R side to compensate. Encouraged pt to reach out to prosthetist for adjustments prior to next session. Continue POC.     OBJECTIVE IMPAIRMENTS Abnormal gait, decreased activity tolerance, decreased endurance, decreased knowledge of use of DME, decreased mobility, difficulty walking, decreased strength, increased edema, prosthetic dependency , obesity, and pain.   ACTIVITY LIMITATIONS carrying, lifting, squatting, stairs, transfers, locomotion level, and caring for others  PARTICIPATION LIMITATIONS: meal prep, cleaning, laundry, driving, shopping, community activity, occupation, and yard work  PERSONAL Education officer, community, Past/current experiences, Transportation, and 1-2 comorbidities: dialysis on T/Th/Sat and chronic pain of R knee  are also affecting patient's functional outcome.   REHAB POTENTIAL: Good  CLINICAL DECISION MAKING: Evolving/moderate complexity  EVALUATION COMPLEXITY: Moderate  PLAN: PT FREQUENCY: 2x/week  PT DURATION: 8 weeks  PLANNED INTERVENTIONS: Therapeutic exercises, Therapeutic activity, Neuromuscular re-education, Balance training, Gait training, Patient/Family education, Self Care, Joint mobilization, Stair training, Vestibular training, Canalith repositioning, Prosthetic training, DME instructions, Dry Needling, Cryotherapy, Moist heat, Compression bandaging, Taping, Manual therapy, and Re-evaluation  PLAN FOR NEXT SESSION: Did pt get leg adjusted? 2MWT, review HEP from previous session and update prn, continue gait training w/emphasis on L knee flexion w/toe off and midline orientation    Alexandra Posadas E Ted Leonhart, PT, DPT 02/23/2022, 12:12 PM

## 2022-02-24 DIAGNOSIS — L299 Pruritus, unspecified: Secondary | ICD-10-CM | POA: Diagnosis not present

## 2022-02-24 DIAGNOSIS — N186 End stage renal disease: Secondary | ICD-10-CM | POA: Diagnosis not present

## 2022-02-24 DIAGNOSIS — D689 Coagulation defect, unspecified: Secondary | ICD-10-CM | POA: Diagnosis not present

## 2022-02-24 DIAGNOSIS — E1122 Type 2 diabetes mellitus with diabetic chronic kidney disease: Secondary | ICD-10-CM | POA: Diagnosis not present

## 2022-02-24 DIAGNOSIS — D509 Iron deficiency anemia, unspecified: Secondary | ICD-10-CM | POA: Diagnosis not present

## 2022-02-24 DIAGNOSIS — Z992 Dependence on renal dialysis: Secondary | ICD-10-CM | POA: Diagnosis not present

## 2022-02-24 DIAGNOSIS — D631 Anemia in chronic kidney disease: Secondary | ICD-10-CM | POA: Diagnosis not present

## 2022-02-24 DIAGNOSIS — R52 Pain, unspecified: Secondary | ICD-10-CM | POA: Diagnosis not present

## 2022-02-24 DIAGNOSIS — N2581 Secondary hyperparathyroidism of renal origin: Secondary | ICD-10-CM | POA: Diagnosis not present

## 2022-02-24 DIAGNOSIS — Z23 Encounter for immunization: Secondary | ICD-10-CM | POA: Diagnosis not present

## 2022-02-25 ENCOUNTER — Ambulatory Visit: Payer: Medicare Other | Admitting: Physical Therapy

## 2022-02-26 DIAGNOSIS — Z23 Encounter for immunization: Secondary | ICD-10-CM | POA: Diagnosis not present

## 2022-02-26 DIAGNOSIS — L299 Pruritus, unspecified: Secondary | ICD-10-CM | POA: Diagnosis not present

## 2022-02-26 DIAGNOSIS — D689 Coagulation defect, unspecified: Secondary | ICD-10-CM | POA: Diagnosis not present

## 2022-02-26 DIAGNOSIS — R52 Pain, unspecified: Secondary | ICD-10-CM | POA: Diagnosis not present

## 2022-02-26 DIAGNOSIS — N186 End stage renal disease: Secondary | ICD-10-CM | POA: Diagnosis not present

## 2022-02-26 DIAGNOSIS — D509 Iron deficiency anemia, unspecified: Secondary | ICD-10-CM | POA: Diagnosis not present

## 2022-02-26 DIAGNOSIS — E1122 Type 2 diabetes mellitus with diabetic chronic kidney disease: Secondary | ICD-10-CM | POA: Diagnosis not present

## 2022-02-26 DIAGNOSIS — N2581 Secondary hyperparathyroidism of renal origin: Secondary | ICD-10-CM | POA: Diagnosis not present

## 2022-02-26 DIAGNOSIS — Z992 Dependence on renal dialysis: Secondary | ICD-10-CM | POA: Diagnosis not present

## 2022-02-26 DIAGNOSIS — D631 Anemia in chronic kidney disease: Secondary | ICD-10-CM | POA: Diagnosis not present

## 2022-03-01 DIAGNOSIS — N2581 Secondary hyperparathyroidism of renal origin: Secondary | ICD-10-CM | POA: Diagnosis not present

## 2022-03-01 DIAGNOSIS — E1122 Type 2 diabetes mellitus with diabetic chronic kidney disease: Secondary | ICD-10-CM | POA: Diagnosis not present

## 2022-03-01 DIAGNOSIS — R52 Pain, unspecified: Secondary | ICD-10-CM | POA: Diagnosis not present

## 2022-03-01 DIAGNOSIS — D509 Iron deficiency anemia, unspecified: Secondary | ICD-10-CM | POA: Diagnosis not present

## 2022-03-01 DIAGNOSIS — L299 Pruritus, unspecified: Secondary | ICD-10-CM | POA: Diagnosis not present

## 2022-03-01 DIAGNOSIS — N186 End stage renal disease: Secondary | ICD-10-CM | POA: Diagnosis not present

## 2022-03-01 DIAGNOSIS — D631 Anemia in chronic kidney disease: Secondary | ICD-10-CM | POA: Diagnosis not present

## 2022-03-01 DIAGNOSIS — Z992 Dependence on renal dialysis: Secondary | ICD-10-CM | POA: Diagnosis not present

## 2022-03-01 DIAGNOSIS — D689 Coagulation defect, unspecified: Secondary | ICD-10-CM | POA: Diagnosis not present

## 2022-03-01 DIAGNOSIS — Z23 Encounter for immunization: Secondary | ICD-10-CM | POA: Diagnosis not present

## 2022-03-02 ENCOUNTER — Other Ambulatory Visit: Payer: Self-pay | Admitting: Student

## 2022-03-02 ENCOUNTER — Ambulatory Visit: Payer: Medicare Other

## 2022-03-03 ENCOUNTER — Other Ambulatory Visit: Payer: Self-pay | Admitting: Internal Medicine

## 2022-03-03 DIAGNOSIS — D631 Anemia in chronic kidney disease: Secondary | ICD-10-CM | POA: Diagnosis not present

## 2022-03-03 DIAGNOSIS — L299 Pruritus, unspecified: Secondary | ICD-10-CM | POA: Diagnosis not present

## 2022-03-03 DIAGNOSIS — N2581 Secondary hyperparathyroidism of renal origin: Secondary | ICD-10-CM | POA: Diagnosis not present

## 2022-03-03 DIAGNOSIS — D689 Coagulation defect, unspecified: Secondary | ICD-10-CM | POA: Diagnosis not present

## 2022-03-03 DIAGNOSIS — Z23 Encounter for immunization: Secondary | ICD-10-CM | POA: Diagnosis not present

## 2022-03-03 DIAGNOSIS — R52 Pain, unspecified: Secondary | ICD-10-CM | POA: Diagnosis not present

## 2022-03-03 DIAGNOSIS — Z992 Dependence on renal dialysis: Secondary | ICD-10-CM | POA: Diagnosis not present

## 2022-03-03 DIAGNOSIS — E1122 Type 2 diabetes mellitus with diabetic chronic kidney disease: Secondary | ICD-10-CM | POA: Diagnosis not present

## 2022-03-03 DIAGNOSIS — D509 Iron deficiency anemia, unspecified: Secondary | ICD-10-CM | POA: Diagnosis not present

## 2022-03-03 DIAGNOSIS — N186 End stage renal disease: Secondary | ICD-10-CM | POA: Diagnosis not present

## 2022-03-04 ENCOUNTER — Ambulatory Visit: Payer: Medicare Other

## 2022-03-04 ENCOUNTER — Encounter: Payer: Self-pay | Admitting: Internal Medicine

## 2022-03-04 ENCOUNTER — Ambulatory Visit (INDEPENDENT_AMBULATORY_CARE_PROVIDER_SITE_OTHER): Payer: Medicare Other | Admitting: Internal Medicine

## 2022-03-04 DIAGNOSIS — R293 Abnormal posture: Secondary | ICD-10-CM | POA: Diagnosis not present

## 2022-03-04 DIAGNOSIS — R2689 Other abnormalities of gait and mobility: Secondary | ICD-10-CM | POA: Diagnosis not present

## 2022-03-04 DIAGNOSIS — M25652 Stiffness of left hip, not elsewhere classified: Secondary | ICD-10-CM | POA: Diagnosis not present

## 2022-03-04 DIAGNOSIS — G8929 Other chronic pain: Secondary | ICD-10-CM | POA: Diagnosis not present

## 2022-03-04 DIAGNOSIS — B9689 Other specified bacterial agents as the cause of diseases classified elsewhere: Secondary | ICD-10-CM | POA: Diagnosis not present

## 2022-03-04 DIAGNOSIS — M6281 Muscle weakness (generalized): Secondary | ICD-10-CM | POA: Diagnosis not present

## 2022-03-04 DIAGNOSIS — R2681 Unsteadiness on feet: Secondary | ICD-10-CM

## 2022-03-04 DIAGNOSIS — Z89612 Acquired absence of left leg above knee: Secondary | ICD-10-CM

## 2022-03-04 DIAGNOSIS — M25561 Pain in right knee: Secondary | ICD-10-CM | POA: Diagnosis not present

## 2022-03-04 DIAGNOSIS — R296 Repeated falls: Secondary | ICD-10-CM | POA: Diagnosis not present

## 2022-03-04 DIAGNOSIS — N760 Acute vaginitis: Secondary | ICD-10-CM

## 2022-03-04 MED ORDER — METRONIDAZOLE 500 MG PO TABS: 500.0000 mg | ORAL_TABLET | Freq: Two times a day (BID) | ORAL | 0 refills | Status: AC

## 2022-03-04 NOTE — Therapy (Signed)
OUTPATIENT PHYSICAL THERAPY NEURO TREATMENT   Patient Name: [name] [name] MRN: [id number] DOB:1975-07-14, 46 y.o., female Today's Date: 03/04/2022   PCP: Riesa Pope, MD REFERRING PROVIDER: Aldine Contes, MD    PT End of Session - 03/04/22 0923     Visit Number 3    Number of Visits 17    Date for PT Re-Evaluation 04/22/22    Authorization Type UHC Medicare/Medicaid of     PT Start Time [phone]    PT Stop Time 1015    PT Time Calculation (min) 44 min    Equipment Utilized During Treatment Gait belt    Activity Tolerance Patient tolerated treatment well    Behavior During Therapy [facility] for tasks assessed/performed              Past Medical History:  Diagnosis Date   Abdominal muscle pain 09/08/2016   Abnormal Pap smear of cervix 2009   Abscess    history of multiple abscesses   Acute bilateral low back pain 02/13/2017   Acute blood loss anemia    Acute on chronic renal failure ([place]) 07/12/2012   Acute renal failure ([place]) 07/12/2012   Acute respiratory failure with hypoxia ([place]) 10/21/2019   Adjustment disorder with depressed mood 03/26/2017   AKI (acute kidney injury) ([place])    Anemia 10/21/2019   Anemia of chronic disease 2002   Anginal pain (HCC)    Anxiety    Panic attacks   Bilateral lower extremity edema 05/13/2016   Bipolar disorder ([place])    Cataract    B/L cataract   Cellulitis 05/21/2014   right eye   CHF (congestive heart failure) ([place])    Chronic bronchitis ([place])    "get it q yr" (05/13/2013)   Chronic pain    Chronic pain of right knee 09/08/2016   Dehiscence of amputation stump ([place])    Depression    Edema of lower extremity    Endocarditis 2002   subacute bacterial endocarditis.    Fall    Family history of anesthesia complication    "my mom has a hard time coming out from under"   Fibromyalgia    GERD (gastroesophageal reflux disease)    occ   Heart murmur    Herpes simplex type 1 infection 01/16/2018   History of  blood transfusion    "just low blood count" (05/13/2013)   Hyperlipidemia    Hypertension    Hypothyroidism    Hypothyroidism, adult 03/21/2014   Hypoxia    Leukocytosis    Muscle spasm 12/16/2020   Necrosis ([place])    and ulceration   Necrotizing fasciitis s/p OR debridements 07/06/2012   Obesity    OSA on CPAP    does not wear all the time   Peripheral neuropathy    Pneumonia    Pyelonephritis 12/31/2020   Routine adult health maintenance 12/08/2016   Severe protein-calorie malnutrition ([place])    Status post below knee amputation, left ([place]) 04/11/2017   Type II diabetes mellitus ([place])    Type  II   Vaginal irritation 03/14/2016   Weak pulse 07/21/2021   Wound dehiscence 09/04/2019   Past Surgical History:  Procedure Laterality Date   A/V FISTULAGRAM Left 05/12/2021   Procedure: A/V FISTULAGRAM;  Surgeon: Broadus John, MD;  Location: [place] [facility];  Service: Cardiovascular;  Laterality: Left;   ABDOMINAL AORTOGRAM W/LOWER EXTREMITY Left 03/19/2019   Procedure: ABDOMINAL AORTOGRAM W/LOWER EXTREMITY;  Surgeon: Serafina Mitchell, MD;  Location: [place] [facility];  Service: Cardiovascular;  Laterality: Left;   ABOVE KNEE LEG AMPUTATION Left 10/11/2019   wound dehisence    AIR/FLUID EXCHANGE Right 12/31/2019   Procedure: AIR/FLUID EXCHANGE;  Surgeon: Jalene Mullet, MD;  Location: [place];  Service: Ophthalmology;  Laterality: Right;   AMPUTATION Left 04/07/2017   Procedure: LEFT BELOW KNEE AMPUTATION;  Surgeon: Newt Minion, MD;  Location: [place];  Service: Orthopedics;  Laterality: Left;   AMPUTATION Left 10/11/2019   Procedure: LEFT ABOVE KNEE AMPUTATION;  Surgeon: Newt Minion, MD;  Location: [place];  Service: Orthopedics;  Laterality: Left;   AV FISTULA PLACEMENT Left 01/05/2021   Procedure: ARTERIOVENOUS (AV) FISTULA CREATION vs. GRAFT;  Surgeon: Angelia Mould, MD;  Location: [facility];  Service: Vascular;  Laterality: Left;   CATARACT EXTRACTION W/PHACO Right  02/11/2020   Procedure: CATARACT EXTRACTION PHACO AND INTRAOCULAR LENS PLACEMENT ([place]);  Surgeon: Jalene Mullet, MD;  Location: [place];  Service: Ophthalmology;  Laterality: Right;   EYE SURGERY     lazer   FISTULA SUPERFICIALIZATION Left 02/23/2021   Procedure: SUPERFICIALIZATION OF LEFT BRACHIOCEPHALIC FISTULA;  Surgeon: Angelia Mould, MD;  Location: [place];  Service: Vascular;  Laterality: Left;   GAS INSERTION Right 12/31/2019   Procedure: INSERTION OF GAS;  Surgeon: Jalene Mullet, MD;  Location: [place];  Service: Ophthalmology;  Laterality: Right;   INCISION AND DRAINAGE ABSCESS     multiple I&Ds   INCISION AND DRAINAGE ABSCESS Left 07/09/2012   Procedure: DRESSING CHANGE, THIGH WOUND;  Surgeon: Harl Bowie, MD;  Location: [place];  Service: General;  Laterality: Left;   INCISION AND DRAINAGE OF WOUND Left 07/07/2012   Procedure: IRRIGATION AND DEBRIDEMENT WOUND;  Surgeon: Harl Bowie, MD;  Location: [place];  Service: General;  Laterality: Left;   INCISION AND DRAINAGE PERIRECTAL ABSCESS Left 07/14/2012   Procedure: DEBRIDEMENT OF SKIN & SOFT TISSUE; DRESSING CHANGE UNDER ANESTHESIA;  Surgeon: Gayland Curry, MD,FACS;  Location: [place];  Service: General;  Laterality: Left;   INCISION AND DRAINAGE PERIRECTAL ABSCESS Left 07/16/2012   Procedure: I&D Left Thigh;  Surgeon: Gwenyth Ober, MD;  Location: [place];  Service: General;  Laterality: Left;   INCISION AND DRAINAGE PERIRECTAL ABSCESS N/A 01/05/2015   Procedure: IRRIGATION AND DEBRIDEMENT PERIRECTAL ABSCESS;  Surgeon: Donnie Mesa, MD;  Location: [place];  Service: General;  Laterality: N/A;   INSERTION OF DIALYSIS CATHETER Right 01/05/2021   Procedure: INSERTION OF DIALYSIS CATHETER;  Surgeon: Angelia Mould, MD;  Location: [place];  Service: Vascular;  Laterality: Right;   IRRIGATION AND DEBRIDEMENT ABSCESS Left 07/06/2012   Procedure: IRRIGATION AND DEBRIDEMENT ABSCESS BUTTOCKS AND THIGH;  Surgeon: Shann Medal, MD;  Location: [place];  Service: General;  Laterality: Left;   IRRIGATION AND DEBRIDEMENT ABSCESS Left 08/10/2012   Procedure: IRRIGATION AND DEBRIDEMENT ABSCESS;  Surgeon: Madilyn Hook, DO;  Location: [place];  Service: General;  Laterality: Left;   PARS PLANA VITRECTOMY Right 12/31/2019   Procedure: PARS PLANA VITRECTOMY WITH 25 GAUGE;  Surgeon: Jalene Mullet, MD;  Location: [place];  Service: Ophthalmology;  Laterality: Right;   PERIPHERAL VASCULAR INTERVENTION Left 05/12/2021   Procedure: PERIPHERAL VASCULAR INTERVENTION;  Surgeon: Broadus John, MD;  Location: [place] [facility];  Service: Cardiovascular;  Laterality: Left;  arm fistula   PHOTOCOAGULATION WITH LASER Right 12/31/2019   Procedure: PHOTOCOAGULATION WITH LASER;  Surgeon: Jalene Mullet, MD;  Location: [place];  Service: Ophthalmology;  Laterality: Right;   REPAIR  OF COMPLEX TRACTION RETINAL DETACHMENT Right 12/31/2019   Procedure: REPAIR OF COMPLEX TRACTION RETINAL;  Surgeon: Jalene Mullet, MD;  Location: [place];  Service: Ophthalmology;  Laterality: Right;   STUMP REVISION Left 06/09/2017   Procedure: REVISION LEFT BELOW KNEE AMPUTATION;  Surgeon: Newt Minion, MD;  Location: [place];  Service: Orthopedics;  Laterality: Left;   STUMP REVISION Left 09/04/2019   Procedure: LEFT BELOW KNEE AMPUTATION REVISION;  Surgeon: Newt Minion, MD;  Location: [place];  Service: Orthopedics;  Laterality: Left;   STUMP REVISION Left 09/20/2019   Procedure: LEFT BELOW KNEE AMPUTATION REVISION;  Surgeon: Newt Minion, MD;  Location: [place];  Service: Orthopedics;  Laterality: Left;   TONSILLECTOMY  1994   Patient Active Problem List   Diagnosis Date Noted   Urgency of urination 09/12/2021   Hidradenitis suppurativa 02/24/2021   Cough 05/26/2020   Chronic pain syndrome 01/16/2020   Hx of AKA (above knee amputation), left (HCC)    Blurry vision, right eye 10/08/2019   Tobacco use 03/01/2019   Phantom limb pain ([place]) 11/08/2018    Current moderate episode of major depressive disorder (HCC)    Hyperkalemia    Anemia of chronic disease    Bipolar affective disorder ([place])    Heart failure with preserved ejection fraction ([place]) 02/09/2017   Healthcare maintenance 12/08/2016   End-stage renal disease on hemodialysis ([place]) 07/15/2016   OSA (obstructive sleep apnea) 05/13/2016   Morbid obesity due to excess calories ([place]) 11/27/2015   Subclinical hypothyroidism 04/25/2013   Type 2 diabetes mellitus ([place]) 07/12/2012   Carpal tunnel syndrome, bilateral 01/25/2012   Diabetic polyneuropathy associated with type 2 diabetes mellitus ([place]) 07/04/2011   Anxiety and depression 06/02/2011   GERD 12/06/2007   Hyperlipidemia 08/29/2007   Hypertension associated with diabetes ([place]) 08/29/2007    ONSET DATE: 02/03/2022   REFERRING DIAG: Z61.096 (ICD-10-CM) - Hx of AKA (above knee amputation), left (HCC)   THERAPY DIAG:  Unsteadiness on feet  Other abnormalities of gait and mobility  Muscle weakness (generalized)  Hx of AKA (above knee amputation), left (HCC)  Abnormal posture  Rationale for Evaluation and Treatment Rehabilitation  SUBJECTIVE:                                                                                                                                                                                              SUBJECTIVE STATEMENT: Patient reports doing fair. Has onset of R sided back pain since using Loftstrand crutch. The longer she's walking the worse it gets.   Pt accompanied by: self  PERTINENT HISTORY:  ESRD, HTN, Chronic  Pain, s/p left AKA     PAIN:  Are you having pain? Yes: NPRS scale: 7/10 Pain location: R knee and low back Pain description: "on fire"  PRECAUTIONS: Fall and Other: On dialysis T/Th/Sat. Fistula in L arm  PLOF: Independent with basic ADLs and Requires assistive device for independence  PATIENT GOALS "I wanna work on walking and maybe strengthening my left knee"    OBJECTIVE:   TODAY'S TREATMENT:  Therex: -reviewed HEP and instructed patient on ways to externally modify L LE length to allow for proper hip alignment during exercises  Gait: -230' with loftstrand crutch and multimodal cues to engage lateral hip stabilizers   PATIENT EDUCATION: Education details: HEP review Person educated: Patient Education method: Customer service manager Education comprehension: verbalized understanding   HOME EXERCISE PROGRAM: To be reviewed from previous POC Access Code: [email or web address]    GOALS: Goals reviewed with patient? Yes  SHORT TERM GOALS: Target date: 03/18/2022  Pt will improve gait velocity to at least 2.40f/s with LRAD and S* for improved gait efficiency and endurance  Baseline: 1.92 ft/s w/Lofstrand and CGA Goal status: INITIAL  2.  Pt will improve 5 x STS to less than or equal to 23 seconds w/BUE support to demonstrate improved functional strength and transfer efficiency.   Baseline: 28.78s w/BUE support  Goal status: INITIAL  3.  2MWT to be performed and STG/LTG written  Baseline:  Goal status: INITIAL   LONG TERM GOALS: Target date: 04/15/2022  Pt will be independent with final HEP for improved strength, balance, transfers and gait.  Baseline:  Goal status: INITIAL  2.  Pt will improve 5 x STS to less than or equal to 20 seconds w/BUE support to demonstrate improved functional strength and transfer efficiency.   Baseline: 28.78s w/BUE support Goal status: INITIAL  3.  2MWT goal Baseline:  Goal status: INITIAL  4.  Pt will improve gait velocity to at least 2.5 ft/s w/LRAD mod I for improved gait efficiency   Baseline:  Goal status: INITIAL   ASSESSMENT:  CLINICAL IMPRESSION: Patient seen for skilled PT session with emphasis on reviewing HEP. Patient reporting onset of back pain since starting to use the Lofstrand crutch. Patient does seem to have a L prosthetic length discrepancy contributing to a  trendelenburg gait, as well as general lateral hip instability and weakness. Patient with prosthetic appt next Friday. Continue POC.     OBJECTIVE IMPAIRMENTS Abnormal gait, decreased activity tolerance, decreased endurance, decreased knowledge of use of DME, decreased mobility, difficulty walking, decreased strength, increased edema, prosthetic dependency , obesity, and pain.   ACTIVITY LIMITATIONS carrying, lifting, squatting, stairs, transfers, locomotion level, and caring for others  PARTICIPATION LIMITATIONS: meal prep, cleaning, laundry, driving, shopping, community activity, occupation, and yard work  PERSONAL FEducation officer, community Past/current experiences, Transportation, and 1-2 comorbidities: dialysis on T/Th/Sat and chronic pain of R knee  are also affecting patient's functional outcome.   REHAB POTENTIAL: Good  CLINICAL DECISION MAKING: Evolving/moderate complexity  EVALUATION COMPLEXITY: Moderate  PLAN: PT FREQUENCY: 2x/week  PT DURATION: 8 weeks  PLANNED INTERVENTIONS: Therapeutic exercises, Therapeutic activity, Neuromuscular re-education, Balance training, Gait training, Patient/Family education, Self Care, Joint mobilization, Stair training, Vestibular training, Canalith repositioning, Prosthetic training, DME instructions, Dry Needling, Cryotherapy, Moist heat, Compression bandaging, Taping, Manual therapy, and Re-evaluation  PLAN FOR NEXT SESSION: Did pt get leg adjusted? 2MWT, continue gait training w/emphasis on L knee flexion w/toe off and midline orientation    JDebbora Dus PT, DPT JDebbora Dus  PT, DPT, CBIS  03/04/2022, 11:00 AM

## 2022-03-04 NOTE — Progress Notes (Addendum)
[facility] Internal [facility] Residency Telephone Encounter Continuity Care Appointment  HPI:  This telephone encounter was created for [name] [name] on 03/04/2022 for the following purpose/cc med refill. Patient able to be reached at [phone] Thinks she has bv again. Itching. Fishy smell. Slight discharge. Not clumping, grainy  film? White color.  Not itching anymore. No abd pain. No fever or chills.  Most recently 4/23 and 4/22    Past Medical History:  Past Medical History:  Diagnosis Date   Abdominal muscle pain 09/08/2016   Abnormal Pap smear of cervix 2009   Abscess    history of multiple abscesses   Acute bilateral low back pain 02/13/2017   Acute blood loss anemia    Acute on chronic renal failure ([place]) 07/12/2012   Acute renal failure ([place]) 07/12/2012   Acute respiratory failure with hypoxia ([place]) 10/21/2019   Adjustment disorder with depressed mood 03/26/2017   AKI (acute kidney injury) ([place])    Anemia 10/21/2019   Anemia of chronic disease 2002   Anginal pain (HCC)    Anxiety    Panic attacks   Bilateral lower extremity edema 05/13/2016   Bipolar disorder ([place])    Cataract    B/L cataract   Cellulitis 05/21/2014   right eye   CHF (congestive heart failure) ([place])    Chronic bronchitis ([place])    "get it q yr" (05/13/2013)   Chronic pain    Chronic pain of right knee 09/08/2016   Dehiscence of amputation stump ([place])    Depression    Edema of lower extremity    Endocarditis 2002   subacute bacterial endocarditis.    Fall    Family history of anesthesia complication    "my mom has a hard time coming out from [place]"   Fibromyalgia    GERD (gastroesophageal reflux disease)    occ   Heart murmur    Herpes simplex type 1 infection 01/16/2018   History of blood transfusion    "just low blood count" (05/13/2013)   Hyperlipidemia    Hypertension    Hypothyroidism    Hypothyroidism, adult 03/21/2014   Hypoxia    Leukocytosis    Muscle spasm  12/16/2020   Necrosis ([place])    and ulceration   Necrotizing fasciitis s/p OR debridements 07/06/2012   Obesity    OSA on CPAP    does not wear all the time   Peripheral neuropathy    Pneumonia    Pyelonephritis 12/31/2020   Routine adult health maintenance 12/08/2016   Severe protein-calorie malnutrition ([place])    Status post below knee amputation, left ([place]) 04/11/2017   Type II diabetes mellitus (HCC)    Type  II   Vaginal irritation 03/14/2016   Weak pulse 07/21/2021   Wound dehiscence 09/04/2019     ROS:     Assessment / Plan / Recommendations:  Please see A&P under problem oriented charting for assessment of the patient's acute and chronic medical conditions.  Symptoms consistent with prior BV episodes. Unable to perform exam via telehealth. Will treat with Flagyl 1 tab BID 7 days. Advised patient that if symptoms do not improve, she will need to be evaluated in person.  As always, pt is advised that if symptoms worsen or new symptoms arise, they should go to an urgent care facility or to to ER for further evaluation.   Consent and Medical Decision Making:  Patient discussed with Dr. Philipp Ovens This is a telephone encounter between [facility] and [employer]  on 03/04/2022 for BV. The visit was conducted with the patient located at home and Delene Ruffini at [facility]. The patient's identity was confirmed using their DOB and current address. The patient has consented to being evaluated through a telephone encounter and understands the associated risks (an examination cannot be done and the patient may need to come in for an appointment) / benefits (allows the patient to remain at home, decreasing exposure to coronavirus). I personally spent 15 minutes on medical discussion.

## 2022-03-05 DIAGNOSIS — D509 Iron deficiency anemia, unspecified: Secondary | ICD-10-CM | POA: Diagnosis not present

## 2022-03-05 DIAGNOSIS — D631 Anemia in chronic kidney disease: Secondary | ICD-10-CM | POA: Diagnosis not present

## 2022-03-05 DIAGNOSIS — D689 Coagulation defect, unspecified: Secondary | ICD-10-CM | POA: Diagnosis not present

## 2022-03-05 DIAGNOSIS — R52 Pain, unspecified: Secondary | ICD-10-CM | POA: Diagnosis not present

## 2022-03-05 DIAGNOSIS — N186 End stage renal disease: Secondary | ICD-10-CM | POA: Diagnosis not present

## 2022-03-05 DIAGNOSIS — N2581 Secondary hyperparathyroidism of renal origin: Secondary | ICD-10-CM | POA: Diagnosis not present

## 2022-03-05 DIAGNOSIS — Z992 Dependence on renal dialysis: Secondary | ICD-10-CM | POA: Diagnosis not present

## 2022-03-05 DIAGNOSIS — L299 Pruritus, unspecified: Secondary | ICD-10-CM | POA: Diagnosis not present

## 2022-03-05 DIAGNOSIS — Z23 Encounter for immunization: Secondary | ICD-10-CM | POA: Diagnosis not present

## 2022-03-05 DIAGNOSIS — E1122 Type 2 diabetes mellitus with diabetic chronic kidney disease: Secondary | ICD-10-CM | POA: Diagnosis not present

## 2022-03-07 NOTE — Progress Notes (Signed)
Internal Medicine Clinic Attending ° °Case discussed with Dr. Gawaluck  At the time of the visit.  We reviewed the resident’s history and exam and pertinent patient test results.  I agree with the assessment, diagnosis, and plan of care documented in the resident’s note.  °

## 2022-03-08 DIAGNOSIS — N2581 Secondary hyperparathyroidism of renal origin: Secondary | ICD-10-CM | POA: Diagnosis not present

## 2022-03-08 DIAGNOSIS — Z992 Dependence on renal dialysis: Secondary | ICD-10-CM | POA: Diagnosis not present

## 2022-03-08 DIAGNOSIS — N186 End stage renal disease: Secondary | ICD-10-CM | POA: Diagnosis not present

## 2022-03-08 DIAGNOSIS — R52 Pain, unspecified: Secondary | ICD-10-CM | POA: Diagnosis not present

## 2022-03-08 DIAGNOSIS — D631 Anemia in chronic kidney disease: Secondary | ICD-10-CM | POA: Diagnosis not present

## 2022-03-08 DIAGNOSIS — L299 Pruritus, unspecified: Secondary | ICD-10-CM | POA: Diagnosis not present

## 2022-03-08 DIAGNOSIS — D509 Iron deficiency anemia, unspecified: Secondary | ICD-10-CM | POA: Diagnosis not present

## 2022-03-08 DIAGNOSIS — E1122 Type 2 diabetes mellitus with diabetic chronic kidney disease: Secondary | ICD-10-CM | POA: Diagnosis not present

## 2022-03-08 DIAGNOSIS — Z23 Encounter for immunization: Secondary | ICD-10-CM | POA: Diagnosis not present

## 2022-03-08 DIAGNOSIS — D689 Coagulation defect, unspecified: Secondary | ICD-10-CM | POA: Diagnosis not present

## 2022-03-09 ENCOUNTER — Ambulatory Visit: Payer: Medicare Other

## 2022-03-09 DIAGNOSIS — M25652 Stiffness of left hip, not elsewhere classified: Secondary | ICD-10-CM | POA: Diagnosis not present

## 2022-03-09 DIAGNOSIS — G8929 Other chronic pain: Secondary | ICD-10-CM | POA: Diagnosis not present

## 2022-03-09 DIAGNOSIS — R293 Abnormal posture: Secondary | ICD-10-CM

## 2022-03-09 DIAGNOSIS — R2681 Unsteadiness on feet: Secondary | ICD-10-CM

## 2022-03-09 DIAGNOSIS — M6281 Muscle weakness (generalized): Secondary | ICD-10-CM | POA: Diagnosis not present

## 2022-03-09 DIAGNOSIS — R296 Repeated falls: Secondary | ICD-10-CM

## 2022-03-09 DIAGNOSIS — Z89612 Acquired absence of left leg above knee: Secondary | ICD-10-CM | POA: Diagnosis not present

## 2022-03-09 DIAGNOSIS — R2689 Other abnormalities of gait and mobility: Secondary | ICD-10-CM | POA: Diagnosis not present

## 2022-03-09 DIAGNOSIS — M25561 Pain in right knee: Secondary | ICD-10-CM | POA: Diagnosis not present

## 2022-03-09 NOTE — Therapy (Signed)
OUTPATIENT PHYSICAL THERAPY NEURO TREATMENT   Patient Name: [name] [name] MRN: [id number] DOB:[birth year], 46 y.o., female Today's Date: 03/09/2022   PCP: Riesa Pope, MD REFERRING PROVIDER: Aldine Contes, MD    PT End of Session - 03/09/22 1015     Visit Number 4    Number of Visits 17    Date for PT Re-Evaluation 04/22/22    Authorization Type UHC Medicare/Medicaid of [place]    PT Start Time 4098    PT Stop Time 1058    PT Time Calculation (min) 43 min    Equipment Utilized During Treatment Gait belt    Activity Tolerance Patient tolerated treatment well    Behavior During Therapy WFL for tasks assessed/performed              Past Medical History:  Diagnosis Date   Abdominal muscle pain 09/08/2016   Abnormal Pap smear of cervix 2009   Abscess    history of multiple abscesses   Acute bilateral low back pain 02/13/2017   Acute blood loss anemia    Acute on chronic renal failure ([place]) 07/12/2012   Acute renal failure ([place]) 07/12/2012   Acute respiratory failure with hypoxia ([place]) 10/21/2019   Adjustment disorder with depressed mood 03/26/2017   AKI (acute kidney injury) ([place])    Anemia 10/21/2019   Anemia of chronic disease 2002   Anginal pain (HCC)    Anxiety    Panic attacks   Bilateral lower extremity edema 05/13/2016   Bipolar disorder ([place])    Cataract    B/L cataract   Cellulitis 05/21/2014   right eye   CHF (congestive heart failure) ([place])    Chronic bronchitis ([place])    "get it q yr" (05/13/2013)   Chronic pain    Chronic pain of right knee 09/08/2016   Dehiscence of amputation stump (HCC)    Depression    Edema of lower extremity    Endocarditis 2002   subacute bacterial endocarditis.    Fall    Family history of anesthesia complication    "my mom has a hard time coming out from under"   Fibromyalgia    GERD (gastroesophageal reflux disease)    occ   Heart murmur    Herpes simplex type 1 infection 01/16/2018   History of  blood transfusion    "just low blood count" (05/13/2013)   Hyperlipidemia    Hypertension    Hypothyroidism    Hypothyroidism, adult 03/21/2014   Hypoxia    Leukocytosis    Muscle spasm 12/16/2020   Necrosis ([place])    and ulceration   Necrotizing fasciitis s/p OR debridements 07/06/2012   Obesity    OSA on CPAP    does not wear all the time   Peripheral neuropathy    Pneumonia    Pyelonephritis 12/31/2020   Routine adult health maintenance 12/08/2016   Severe protein-calorie malnutrition ([place])    Status post below knee amputation, left ([place]) 04/11/2017   Type II diabetes mellitus ([place])    Type  II   Vaginal irritation 03/14/2016   Weak pulse 07/21/2021   Wound dehiscence 09/04/2019   Past Surgical History:  Procedure Laterality Date   A/V FISTULAGRAM Left 05/12/2021   Procedure: A/V FISTULAGRAM;  Surgeon: Broadus John, MD;  Location: [facility];  Service: Cardiovascular;  Laterality: Left;   ABDOMINAL AORTOGRAM W/LOWER EXTREMITY Left 03/19/2019   Procedure: ABDOMINAL AORTOGRAM W/LOWER EXTREMITY;  Surgeon: Serafina Mitchell, MD;  Location: [facility];  Service: Cardiovascular;  Laterality: Left;   ABOVE KNEE LEG AMPUTATION Left 10/11/2019   wound dehisence    AIR/FLUID EXCHANGE Right 12/31/2019   Procedure: AIR/FLUID EXCHANGE;  Surgeon: Jalene Mullet, MD;  Location: [facility];  Service: Ophthalmology;  Laterality: Right;   AMPUTATION Left 04/07/2017   Procedure: LEFT BELOW KNEE AMPUTATION;  Surgeon: Newt Minion, MD;  Location: [facility];  Service: Orthopedics;  Laterality: Left;   AMPUTATION Left 10/11/2019   Procedure: LEFT ABOVE KNEE AMPUTATION;  Surgeon: Newt Minion, MD;  Location: [facility];  Service: Orthopedics;  Laterality: Left;   AV FISTULA PLACEMENT Left 01/05/2021   Procedure: ARTERIOVENOUS (AV) FISTULA CREATION vs. GRAFT;  Surgeon: Angelia Mould, MD;  Location: [facility];  Service: Vascular;  Laterality: Left;   CATARACT EXTRACTION W/PHACO Right  02/11/2020   Procedure: CATARACT EXTRACTION PHACO AND INTRAOCULAR LENS PLACEMENT ([place]);  Surgeon: Jalene Mullet, MD;  Location: [facility];  Service: Ophthalmology;  Laterality: Right;   EYE SURGERY     lazer   FISTULA SUPERFICIALIZATION Left 02/23/2021   Procedure: SUPERFICIALIZATION OF LEFT BRACHIOCEPHALIC FISTULA;  Surgeon: Angelia Mould, MD;  Location: [facility];  Service: Vascular;  Laterality: Left;   GAS INSERTION Right 12/31/2019   Procedure: INSERTION OF GAS;  Surgeon: Jalene Mullet, MD;  Location: [facility];  Service: Ophthalmology;  Laterality: Right;   INCISION AND DRAINAGE ABSCESS     multiple I&Ds   INCISION AND DRAINAGE ABSCESS Left 07/09/2012   Procedure: DRESSING CHANGE, THIGH WOUND;  Surgeon: Harl Bowie, MD;  Location: [facility];  Service: General;  Laterality: Left;   INCISION AND DRAINAGE OF WOUND Left 07/07/2012   Procedure: IRRIGATION AND DEBRIDEMENT WOUND;  Surgeon: Harl Bowie, MD;  Location: [facility];  Service: General;  Laterality: Left;   INCISION AND DRAINAGE PERIRECTAL ABSCESS Left 07/14/2012   Procedure: DEBRIDEMENT OF SKIN & SOFT TISSUE; DRESSING CHANGE UNDER ANESTHESIA;  Surgeon: Gayland Curry, MD,FACS;  Location: [facility];  Service: General;  Laterality: Left;   INCISION AND DRAINAGE PERIRECTAL ABSCESS Left 07/16/2012   Procedure: I&D Left Thigh;  Surgeon: Gwenyth Ober, MD;  Location: [facility];  Service: General;  Laterality: Left;   INCISION AND DRAINAGE PERIRECTAL ABSCESS N/A 01/05/2015   Procedure: IRRIGATION AND DEBRIDEMENT PERIRECTAL ABSCESS;  Surgeon: Donnie Mesa, MD;  Location: [facility];  Service: General;  Laterality: N/A;   INSERTION OF DIALYSIS CATHETER Right 01/05/2021   Procedure: INSERTION OF DIALYSIS CATHETER;  Surgeon: Angelia Mould, MD;  Location: [facility];  Service: Vascular;  Laterality: Right;   IRRIGATION AND DEBRIDEMENT ABSCESS Left 07/06/2012   Procedure: IRRIGATION AND DEBRIDEMENT ABSCESS BUTTOCKS AND THIGH;  Surgeon: Shann Medal, MD;  Location: [facility];  Service: General;  Laterality: Left;   IRRIGATION AND DEBRIDEMENT ABSCESS Left 08/10/2012   Procedure: IRRIGATION AND DEBRIDEMENT ABSCESS;  Surgeon: Madilyn Hook, DO;  Location: [facility];  Service: General;  Laterality: Left;   PARS PLANA VITRECTOMY Right 12/31/2019   Procedure: PARS PLANA VITRECTOMY WITH 25 GAUGE;  Surgeon: Jalene Mullet, MD;  Location: [facility];  Service: Ophthalmology;  Laterality: Right;   PERIPHERAL VASCULAR INTERVENTION Left 05/12/2021   Procedure: PERIPHERAL VASCULAR INTERVENTION;  Surgeon: Broadus John, MD;  Location: [facility];  Service: Cardiovascular;  Laterality: Left;  arm fistula   PHOTOCOAGULATION WITH LASER Right 12/31/2019   Procedure: PHOTOCOAGULATION WITH LASER;  Surgeon: Jalene Mullet, MD;  Location: [facility];  Service: Ophthalmology;  Laterality: Right;   REPAIR  OF COMPLEX TRACTION RETINAL DETACHMENT Right 12/31/2019   Procedure: REPAIR OF COMPLEX TRACTION RETINAL;  Surgeon: Jalene Mullet, MD;  Location: [facility];  Service: Ophthalmology;  Laterality: Right;   STUMP REVISION Left 06/09/2017   Procedure: REVISION LEFT BELOW KNEE AMPUTATION;  Surgeon: Newt Minion, MD;  Location: [facility];  Service: Orthopedics;  Laterality: Left;   STUMP REVISION Left 09/04/2019   Procedure: LEFT BELOW KNEE AMPUTATION REVISION;  Surgeon: Newt Minion, MD;  Location: [facility];  Service: Orthopedics;  Laterality: Left;   STUMP REVISION Left 09/20/2019   Procedure: LEFT BELOW KNEE AMPUTATION REVISION;  Surgeon: Newt Minion, MD;  Location: [facility];  Service: Orthopedics;  Laterality: Left;   TONSILLECTOMY  1994   Patient Active Problem List   Diagnosis Date Noted   Urgency of urination 09/12/2021   Hidradenitis suppurativa 02/24/2021   Cough 05/26/2020   Chronic pain syndrome 01/16/2020   Hx of AKA (above knee amputation), left (HCC)    Blurry vision, right eye 10/08/2019   Tobacco use 03/01/2019   Phantom limb pain ([place]) 11/08/2018    Current moderate episode of major depressive disorder (HCC)    Hyperkalemia    Anemia of chronic disease    Bipolar affective disorder ([place])    Heart failure with preserved ejection fraction ([place]) 02/09/2017   Healthcare maintenance 12/08/2016   End-stage renal disease on hemodialysis ([place]) 07/15/2016   OSA (obstructive sleep apnea) 05/13/2016   Morbid obesity due to excess calories ([place]) 11/27/2015   Subclinical hypothyroidism 04/25/2013   Type 2 diabetes mellitus ([place]) 07/12/2012   Carpal tunnel syndrome, bilateral 01/25/2012   Diabetic polyneuropathy associated with type 2 diabetes mellitus ([place]) 07/04/2011   Anxiety and depression 06/02/2011   GERD 12/06/2007   Hyperlipidemia 08/29/2007   Hypertension associated with diabetes ([place]) 08/29/2007    ONSET DATE: 02/03/2022   REFERRING DIAG: L93.790 (ICD-10-CM) - Hx of AKA (above knee amputation), left (HCC)   THERAPY DIAG:  Unsteadiness on feet  Other abnormalities of gait and mobility  Muscle weakness (generalized)  Abnormal posture  Chronic pain of right knee  Repeated falls  Hip stiffness, left  Hx of AKA (above knee amputation), left ([place])  Rationale for Evaluation and Treatment Rehabilitation  SUBJECTIVE:                                                                                                                                                                                              SUBJECTIVE STATEMENT: Patient reports doing well. Still with back pain. Was able to walk for ~1 hour on Friday, but with significant back pain.  Pt accompanied by: self  PERTINENT HISTORY:  ESRD, HTN, Chronic Pain, s/p left AKA     PAIN:  Are you having pain? Yes: NPRS scale: 5/10 Pain location: R knee and low back Pain description: "on fire"  PRECAUTIONS: Fall and Other: On dialysis T/Th/Sat. Fistula in L arm  PLOF: Independent with basic ADLs and Requires assistive device for independence  PATIENT GOALS "I wanna  work on walking and maybe strengthening my left knee"   OBJECTIVE:   TODAY'S TREATMENT:  Therex: -anterior ball roll out to the R for low back stretch 3x30s -seated lateral bend stretch 3x30s -scifit hills x10 mins level 2, B UE/LE -sit <> stand with R LE biased on 4" box and then L LE on 4" box   PATIENT EDUCATION: Education details: continue HEP Person educated: Patient Education method: Customer service manager Education comprehension: verbalized understanding   HOME EXERCISE PROGRAM: Access Code: [email or web address]    GOALS: Goals reviewed with patient? Yes  SHORT TERM GOALS: Target date: 03/18/2022  Pt will improve gait velocity to at least 2.13f/s with LRAD and S* for improved gait efficiency and endurance  Baseline: 1.92 ft/s w/[place] and CGA Goal status: INITIAL  2.  Pt will improve 5 x STS to less than or equal to 23 seconds w/BUE support to demonstrate improved functional strength and transfer efficiency.   Baseline: 28.78s w/BUE support  Goal status: INITIAL  3.  2MWT to be performed and STG/LTG written  Baseline:  Goal status: INITIAL   LONG TERM GOALS: Target date: 04/15/2022  Pt will be independent with final HEP for improved strength, balance, transfers and gait.  Baseline:  Goal status: INITIAL  2.  Pt will improve 5 x STS to less than or equal to 20 seconds w/BUE support to demonstrate improved functional strength and transfer efficiency.   Baseline: 28.78s w/BUE support Goal status: INITIAL  3.  2MWT goal Baseline:  Goal status: INITIAL  4.  Pt will improve gait velocity to at least 2.5 ft/s w/LRAD mod I for improved gait efficiency   Baseline:  Goal status: INITIAL   ASSESSMENT:  CLINICAL IMPRESSION: Patient seen for skilled PT session with emphasis on functional LE strengthening. Patient continues to have postural dysfunction related to height of prosthetic and possible rotation of that knee. Reporting better ability to control L  knee flexion through functional tasks. Continue POC.     OBJECTIVE IMPAIRMENTS Abnormal gait, decreased activity tolerance, decreased endurance, decreased knowledge of use of DME, decreased mobility, difficulty walking, decreased strength, increased edema, prosthetic dependency , obesity, and pain.   ACTIVITY LIMITATIONS carrying, lifting, squatting, stairs, transfers, locomotion level, and caring for others  PARTICIPATION LIMITATIONS: meal prep, cleaning, laundry, driving, shopping, community activity, occupation, and yard work  PERSONAL FEducation officer, community Past/current experiences, Transportation, and 1-2 comorbidities: dialysis on T/Th/Sat and chronic pain of R knee  are also affecting patient's functional outcome.   REHAB POTENTIAL: Good  CLINICAL DECISION MAKING: Evolving/moderate complexity  EVALUATION COMPLEXITY: Moderate  PLAN: PT FREQUENCY: 2x/week  PT DURATION: 8 weeks  PLANNED INTERVENTIONS: Therapeutic exercises, Therapeutic activity, Neuromuscular re-education, Balance training, Gait training, Patient/Family education, Self Care, Joint mobilization, Stair training, Vestibular training, Canalith repositioning, Prosthetic training, DME instructions, Dry Needling, Cryotherapy, Moist heat, Compression bandaging, Taping, Manual therapy, and Re-evaluation  PLAN FOR NEXT SESSION: Did pt get leg adjusted? 2MWT, continue gait training w/emphasis on L knee flexion w/toe off and midline orientation    JDebbora Dus PT, DPT JDebbora Dus PT, DPT,  CBIS  03/09/2022, 11:03 AM

## 2022-03-10 DIAGNOSIS — Z992 Dependence on renal dialysis: Secondary | ICD-10-CM | POA: Diagnosis not present

## 2022-03-10 DIAGNOSIS — R52 Pain, unspecified: Secondary | ICD-10-CM | POA: Diagnosis not present

## 2022-03-10 DIAGNOSIS — Z23 Encounter for immunization: Secondary | ICD-10-CM | POA: Diagnosis not present

## 2022-03-10 DIAGNOSIS — D631 Anemia in chronic kidney disease: Secondary | ICD-10-CM | POA: Diagnosis not present

## 2022-03-10 DIAGNOSIS — L299 Pruritus, unspecified: Secondary | ICD-10-CM | POA: Diagnosis not present

## 2022-03-10 DIAGNOSIS — D689 Coagulation defect, unspecified: Secondary | ICD-10-CM | POA: Diagnosis not present

## 2022-03-10 DIAGNOSIS — D509 Iron deficiency anemia, unspecified: Secondary | ICD-10-CM | POA: Diagnosis not present

## 2022-03-10 DIAGNOSIS — N2581 Secondary hyperparathyroidism of renal origin: Secondary | ICD-10-CM | POA: Diagnosis not present

## 2022-03-10 DIAGNOSIS — E1122 Type 2 diabetes mellitus with diabetic chronic kidney disease: Secondary | ICD-10-CM | POA: Diagnosis not present

## 2022-03-10 DIAGNOSIS — N186 End stage renal disease: Secondary | ICD-10-CM | POA: Diagnosis not present

## 2022-03-11 ENCOUNTER — Ambulatory Visit: Payer: Medicare Other | Admitting: Physical Therapy

## 2022-03-12 DIAGNOSIS — N2581 Secondary hyperparathyroidism of renal origin: Secondary | ICD-10-CM | POA: Diagnosis not present

## 2022-03-12 DIAGNOSIS — L299 Pruritus, unspecified: Secondary | ICD-10-CM | POA: Diagnosis not present

## 2022-03-12 DIAGNOSIS — D631 Anemia in chronic kidney disease: Secondary | ICD-10-CM | POA: Diagnosis not present

## 2022-03-12 DIAGNOSIS — Z23 Encounter for immunization: Secondary | ICD-10-CM | POA: Diagnosis not present

## 2022-03-12 DIAGNOSIS — Z992 Dependence on renal dialysis: Secondary | ICD-10-CM | POA: Diagnosis not present

## 2022-03-12 DIAGNOSIS — N186 End stage renal disease: Secondary | ICD-10-CM | POA: Diagnosis not present

## 2022-03-12 DIAGNOSIS — D509 Iron deficiency anemia, unspecified: Secondary | ICD-10-CM | POA: Diagnosis not present

## 2022-03-12 DIAGNOSIS — D689 Coagulation defect, unspecified: Secondary | ICD-10-CM | POA: Diagnosis not present

## 2022-03-12 DIAGNOSIS — E1122 Type 2 diabetes mellitus with diabetic chronic kidney disease: Secondary | ICD-10-CM | POA: Diagnosis not present

## 2022-03-12 DIAGNOSIS — R52 Pain, unspecified: Secondary | ICD-10-CM | POA: Diagnosis not present

## 2022-03-14 ENCOUNTER — Other Ambulatory Visit: Payer: Self-pay | Admitting: Student

## 2022-03-15 DIAGNOSIS — Z992 Dependence on renal dialysis: Secondary | ICD-10-CM | POA: Diagnosis not present

## 2022-03-15 DIAGNOSIS — N186 End stage renal disease: Secondary | ICD-10-CM | POA: Diagnosis not present

## 2022-03-15 DIAGNOSIS — Z23 Encounter for immunization: Secondary | ICD-10-CM | POA: Diagnosis not present

## 2022-03-15 DIAGNOSIS — D689 Coagulation defect, unspecified: Secondary | ICD-10-CM | POA: Diagnosis not present

## 2022-03-15 DIAGNOSIS — R52 Pain, unspecified: Secondary | ICD-10-CM | POA: Diagnosis not present

## 2022-03-15 DIAGNOSIS — N2581 Secondary hyperparathyroidism of renal origin: Secondary | ICD-10-CM | POA: Diagnosis not present

## 2022-03-15 DIAGNOSIS — D631 Anemia in chronic kidney disease: Secondary | ICD-10-CM | POA: Diagnosis not present

## 2022-03-15 DIAGNOSIS — D509 Iron deficiency anemia, unspecified: Secondary | ICD-10-CM | POA: Diagnosis not present

## 2022-03-15 DIAGNOSIS — E1122 Type 2 diabetes mellitus with diabetic chronic kidney disease: Secondary | ICD-10-CM | POA: Diagnosis not present

## 2022-03-15 DIAGNOSIS — L299 Pruritus, unspecified: Secondary | ICD-10-CM | POA: Diagnosis not present

## 2022-03-15 NOTE — Telephone Encounter (Signed)
PDMP reviewed, appropriate. Refill chronic opioids for one month.

## 2022-03-15 NOTE — Telephone Encounter (Signed)
[facility] appointment  03/04/2022. Last Appointment 12/06/2021.  Last [facility] 10/02/2020

## 2022-03-16 ENCOUNTER — Ambulatory Visit: Payer: Medicare Other | Admitting: Physical Therapy

## 2022-03-16 DIAGNOSIS — M6281 Muscle weakness (generalized): Secondary | ICD-10-CM

## 2022-03-16 DIAGNOSIS — M25561 Pain in right knee: Secondary | ICD-10-CM | POA: Diagnosis not present

## 2022-03-16 DIAGNOSIS — G8929 Other chronic pain: Secondary | ICD-10-CM | POA: Diagnosis not present

## 2022-03-16 DIAGNOSIS — Z89612 Acquired absence of left leg above knee: Secondary | ICD-10-CM | POA: Diagnosis not present

## 2022-03-16 DIAGNOSIS — R2681 Unsteadiness on feet: Secondary | ICD-10-CM

## 2022-03-16 DIAGNOSIS — R296 Repeated falls: Secondary | ICD-10-CM | POA: Diagnosis not present

## 2022-03-16 DIAGNOSIS — R2689 Other abnormalities of gait and mobility: Secondary | ICD-10-CM

## 2022-03-16 DIAGNOSIS — M25652 Stiffness of left hip, not elsewhere classified: Secondary | ICD-10-CM | POA: Diagnosis not present

## 2022-03-16 DIAGNOSIS — R293 Abnormal posture: Secondary | ICD-10-CM | POA: Diagnosis not present

## 2022-03-16 NOTE — Therapy (Signed)
OUTPATIENT PHYSICAL THERAPY NEURO TREATMENT   Patient Name: Anira Senegal MRN: [id number] DOB:03-Jan-1976, 46 y.o., female Today's Date: 03/16/2022   PCP: Riesa Pope, MD REFERRING PROVIDER: Aldine Contes, MD    PT End of Session - 03/16/22 1015     Visit Number 5    Number of Visits 17    Date for PT Re-Evaluation 04/22/22    Authorization Type UHC Medicare/Medicaid of [place]    PT Start Time 1012    PT Stop Time 1056    PT Time Calculation (min) 44 min    Equipment Utilized During Treatment Gait belt    Activity Tolerance Patient tolerated treatment well    Behavior During Therapy WFL for tasks assessed/performed               Past Medical History:  Diagnosis Date   Abdominal muscle pain 09/08/2016   Abnormal Pap smear of cervix 2009   Abscess    history of multiple abscesses   Acute bilateral low back pain 02/13/2017   Acute blood loss anemia    Acute on chronic renal failure ([place]) 07/12/2012   Acute renal failure ([place]) 07/12/2012   Acute respiratory failure with hypoxia ([place]) 10/21/2019   Adjustment disorder with depressed mood 03/26/2017   AKI (acute kidney injury) ([place])    Anemia 10/21/2019   Anemia of chronic disease 2002   Anginal pain (HCC)    Anxiety    Panic attacks   Bilateral lower extremity edema 05/13/2016   Bipolar disorder ([place])    Cataract    B/L cataract   Cellulitis 05/21/2014   right eye   CHF (congestive heart failure) ([place])    Chronic bronchitis ([place])    "get it q yr" (05/13/2013)   Chronic pain    Chronic pain of right knee 09/08/2016   Dehiscence of amputation stump (HCC)    Depression    Edema of lower extremity    Endocarditis 2002   subacute bacterial endocarditis.    Fall    Family history of anesthesia complication    "my mom has a hard time coming out from under"   Fibromyalgia    GERD (gastroesophageal reflux disease)    occ   Heart murmur    Herpes simplex type 1 infection 01/16/2018   History of  blood transfusion    "just low blood count" (05/13/2013)   Hyperlipidemia    Hypertension    Hypothyroidism    Hypothyroidism, adult 03/21/2014   Hypoxia    Leukocytosis    Muscle spasm 12/16/2020   Necrosis ([place])    and ulceration   Necrotizing fasciitis s/p OR debridements 07/06/2012   Obesity    OSA on CPAP    does not wear all the time   Peripheral neuropathy    Pneumonia    Pyelonephritis 12/31/2020   Routine adult health maintenance 12/08/2016   Severe protein-calorie malnutrition ([place])    Status post below knee amputation, left ([place]) 04/11/2017   Type II diabetes mellitus ([place])    Type  II   Vaginal irritation 03/14/2016   Weak pulse 07/21/2021   Wound dehiscence 09/04/2019   Past Surgical History:  Procedure Laterality Date   A/V FISTULAGRAM Left 05/12/2021   Procedure: A/V FISTULAGRAM;  Surgeon: Broadus John, MD;  Location: [facility];  Service: Cardiovascular;  Laterality: Left;   ABDOMINAL AORTOGRAM W/LOWER EXTREMITY Left 03/19/2019   Procedure: ABDOMINAL AORTOGRAM W/LOWER EXTREMITY;  Surgeon: Serafina Mitchell, MD;  Location: [facility];  Service: Cardiovascular;  Laterality: Left;   ABOVE KNEE LEG AMPUTATION Left 10/11/2019   wound dehisence    AIR/FLUID EXCHANGE Right 12/31/2019   Procedure: AIR/FLUID EXCHANGE;  Surgeon: Jalene Mullet, MD;  Location: [facility];  Service: Ophthalmology;  Laterality: Right;   AMPUTATION Left 04/07/2017   Procedure: LEFT BELOW KNEE AMPUTATION;  Surgeon: Newt Minion, MD;  Location: [facility];  Service: Orthopedics;  Laterality: Left;   AMPUTATION Left 10/11/2019   Procedure: LEFT ABOVE KNEE AMPUTATION;  Surgeon: Newt Minion, MD;  Location: [facility];  Service: Orthopedics;  Laterality: Left;   AV FISTULA PLACEMENT Left 01/05/2021   Procedure: ARTERIOVENOUS (AV) FISTULA CREATION vs. GRAFT;  Surgeon: Angelia Mould, MD;  Location: [facility];  Service: Vascular;  Laterality: Left;   CATARACT EXTRACTION W/PHACO Right  02/11/2020   Procedure: CATARACT EXTRACTION PHACO AND INTRAOCULAR LENS PLACEMENT ([place]);  Surgeon: Jalene Mullet, MD;  Location: [facility];  Service: Ophthalmology;  Laterality: Right;   EYE SURGERY     lazer   FISTULA SUPERFICIALIZATION Left 02/23/2021   Procedure: SUPERFICIALIZATION OF LEFT BRACHIOCEPHALIC FISTULA;  Surgeon: Angelia Mould, MD;  Location: [place];  Service: Vascular;  Laterality: Left;   GAS INSERTION Right 12/31/2019   Procedure: INSERTION OF GAS;  Surgeon: Jalene Mullet, MD;  Location: [facility];  Service: Ophthalmology;  Laterality: Right;   INCISION AND DRAINAGE ABSCESS     multiple I&Ds   INCISION AND DRAINAGE ABSCESS Left 07/09/2012   Procedure: DRESSING CHANGE, THIGH WOUND;  Surgeon: Harl Bowie, MD;  Location: [place];  Service: General;  Laterality: Left;   INCISION AND DRAINAGE OF WOUND Left 07/07/2012   Procedure: IRRIGATION AND DEBRIDEMENT WOUND;  Surgeon: Harl Bowie, MD;  Location: [place];  Service: General;  Laterality: Left;   INCISION AND DRAINAGE PERIRECTAL ABSCESS Left 07/14/2012   Procedure: DEBRIDEMENT OF SKIN & SOFT TISSUE; DRESSING CHANGE UNDER ANESTHESIA;  Surgeon: Gayland Curry, MD,FACS;  Location: [place];  Service: General;  Laterality: Left;   INCISION AND DRAINAGE PERIRECTAL ABSCESS Left 07/16/2012   Procedure: I&D Left Thigh;  Surgeon: Gwenyth Ober, MD;  Location: [place] [facility];  Service: General;  Laterality: Left;   INCISION AND DRAINAGE PERIRECTAL ABSCESS N/A 01/05/2015   Procedure: IRRIGATION AND DEBRIDEMENT PERIRECTAL ABSCESS;  Surgeon: Donnie Mesa, MD;  Location: [place];  Service: General;  Laterality: N/A;   INSERTION OF DIALYSIS CATHETER Right 01/05/2021   Procedure: INSERTION OF DIALYSIS CATHETER;  Surgeon: Angelia Mould, MD;  Location: [facility];  Service: Vascular;  Laterality: Right;   IRRIGATION AND DEBRIDEMENT ABSCESS Left 07/06/2012   Procedure: IRRIGATION AND DEBRIDEMENT ABSCESS BUTTOCKS AND THIGH;  Surgeon: Shann Medal, MD;  Location: [place];  Service: General;  Laterality: Left;   IRRIGATION AND DEBRIDEMENT ABSCESS Left 08/10/2012   Procedure: IRRIGATION AND DEBRIDEMENT ABSCESS;  Surgeon: Madilyn Hook, DO;  Location: [place];  Service: General;  Laterality: Left;   PARS PLANA VITRECTOMY Right 12/31/2019   Procedure: PARS PLANA VITRECTOMY WITH 25 GAUGE;  Surgeon: Jalene Mullet, MD;  Location: [place];  Service: Ophthalmology;  Laterality: Right;   PERIPHERAL VASCULAR INTERVENTION Left 05/12/2021   Procedure: PERIPHERAL VASCULAR INTERVENTION;  Surgeon: Broadus John, MD;  Location: [facility];  Service: Cardiovascular;  Laterality: Left;  arm fistula   PHOTOCOAGULATION WITH LASER Right 12/31/2019   Procedure: PHOTOCOAGULATION WITH LASER;  Surgeon: Jalene Mullet, MD;  Location: [facility] [place];  Service: Ophthalmology;  Laterality: Right;  REPAIR OF COMPLEX TRACTION RETINAL DETACHMENT Right 12/31/2019   Procedure: REPAIR OF COMPLEX TRACTION RETINAL;  Surgeon: Jalene Mullet, MD;  Location: [place];  Service: Ophthalmology;  Laterality: Right;   STUMP REVISION Left 06/09/2017   Procedure: REVISION LEFT BELOW KNEE AMPUTATION;  Surgeon: Newt Minion, MD;  Location: [place];  Service: Orthopedics;  Laterality: Left;   STUMP REVISION Left 09/04/2019   Procedure: LEFT BELOW KNEE AMPUTATION REVISION;  Surgeon: Newt Minion, MD;  Location: [place];  Service: Orthopedics;  Laterality: Left;   STUMP REVISION Left 09/20/2019   Procedure: LEFT BELOW KNEE AMPUTATION REVISION;  Surgeon: Newt Minion, MD;  Location: [place];  Service: Orthopedics;  Laterality: Left;   TONSILLECTOMY  1994   Patient Active Problem List   Diagnosis Date Noted   Urgency of urination 09/12/2021   Hidradenitis suppurativa 02/24/2021   Cough 05/26/2020   Chronic pain syndrome 01/16/2020   Hx of AKA (above knee amputation), left (HCC)    Blurry vision, right eye 10/08/2019   Tobacco use 03/01/2019   Phantom limb pain ([place]) 11/08/2018    Current moderate episode of major depressive disorder (HCC)    Hyperkalemia    Anemia of chronic disease    Bipolar affective disorder ([place])    Heart failure with preserved ejection fraction ([place]) 02/09/2017   Healthcare maintenance 12/08/2016   End-stage renal disease on hemodialysis ([place]) 07/15/2016   OSA (obstructive sleep apnea) 05/13/2016   Morbid obesity due to excess calories ([place]) 11/27/2015   Subclinical hypothyroidism 04/25/2013   Type 2 diabetes mellitus ([place]) 07/12/2012   Carpal tunnel syndrome, bilateral 01/25/2012   Diabetic polyneuropathy associated with type 2 diabetes mellitus ([place]) 07/04/2011   Anxiety and depression 06/02/2011   GERD 12/06/2007   Hyperlipidemia 08/29/2007   Hypertension associated with diabetes ([place]) 08/29/2007    ONSET DATE: 02/03/2022   REFERRING DIAG: T73.220 (ICD-10-CM) - Hx of AKA (above knee amputation), left (HCC)   THERAPY DIAG:  Unsteadiness on feet  Other abnormalities of gait and mobility  Muscle weakness (generalized)  Rationale for Evaluation and Treatment Rehabilitation  SUBJECTIVE:                                                                                                                                                                                              SUBJECTIVE STATEMENT: Patient reports doing well. Not having back pain today. Saw Chris at [facility] and he adjusted her knee and she feels as though that has helped. No falls   Pt accompanied by: self  PERTINENT HISTORY:  ESRD, HTN, Chronic Pain, s/p left AKA  PAIN:  Are you having pain? Yes: NPRS scale: 1-2/10 Pain location: R knee and low back Pain description: dull ache   PRECAUTIONS: Fall and Other: On dialysis T/Th/Sat. Fistula in L arm  PLOF: Independent with basic ADLs and Requires assistive device for independence  PATIENT GOALS "I wanna work on walking and maybe strengthening my left knee"   OBJECTIVE:   TODAY'S TREATMENT:  Ther Act   2MWT: 115' loop completed 1x + 102' = 217' w/Lofstrand and S*. Noted increased L knee flexion and hip extension to facilitate toe off w/reduction in circumduction.   Ther Ex  SciFit multi-peaks level 7 for 8 minutes using BLEs for neural priming for reciprocal movement, dynamic cardiovascular conditioning and BLE strength. No pain following activity   NMR  In // bars for improved BLE placement, LE sequencing and BLE strength:  Quarter turns w/BUE support, x15 per side. Pt required initial max concurrent cues to perform, as she tends to lead w/RLE only regardless of direction of turn. W/visual demonstration, pt able to perform slowly while saying proper sequence out loud. Added to HEP for pt to practice at home  Lateral monster walks w/red theraband around distal quads, down and back x2. Mod cues for increased step clearance and maintained abduction of BLEs, but pt unable to maintain abduction of RLE and reported R knee pain, so discontinued activity. Pt reports she has always had genu valgus and it is challenging for her to walk normally w/L AKA prosthesis, as it cannot be aligned in valgus position.    PATIENT EDUCATION: Education details: updates to HEP Person educated: Patient Education method: Explanation, Media planner, and Handouts Education comprehension: verbalized understanding   HOME EXERCISE PROGRAM: Access Code: [email or web address]    GOALS: Goals reviewed with patient? Yes  SHORT TERM GOALS: Target date: 03/18/2022  Pt will improve gait velocity to at least 2.87f/s with LRAD and S* for improved gait efficiency and endurance  Baseline: 1.92 ft/s w/Lofstrand and CGA Goal status: INITIAL  2.  Pt will improve 5 x STS to less than or equal to 23 seconds w/BUE support to demonstrate improved functional strength and transfer efficiency.   Baseline: 28.78s w/BUE support  Goal status: INITIAL  3.  2MWT to be performed and LTG written  Baseline:  Goal status: MET   LONG TERM  GOALS: Target date: 04/15/2022  Pt will be independent with final HEP for improved strength, balance, transfers and gait.  Baseline:  Goal status: INITIAL  2.  Pt will improve 5 x STS to less than or equal to 20 seconds w/BUE support to demonstrate improved functional strength and transfer efficiency.   Baseline: 28.78s w/BUE support Goal status: INITIAL  3.  Pt will ambulate >/= 260' on 2MWT w/LRAD mod I for improved cardiovascular endurance and functional mobility  Baseline: 217' w/Lofstrand and S*  Goal status: REVISED  4.  Pt will improve gait velocity to at least 2.5 ft/s w/LRAD mod I for improved gait efficiency   Baseline:  Goal status: INITIAL   ASSESSMENT:  CLINICAL IMPRESSION: Emphasis of skilled PT session on endurance, proper LE sequencing, and BLE strength. Pt reported she had L knee adjusted last week and she is able to clear her LLE during swing phase without circumduction. Pt's back pain reduced today but she continues to be limited by R knee pain due to genu valgus position. Continue POC.     OBJECTIVE IMPAIRMENTS Abnormal gait, decreased activity tolerance, decreased endurance, decreased knowledge of use of DME, decreased mobility,  difficulty walking, decreased strength, increased edema, prosthetic dependency , obesity, and pain.   ACTIVITY LIMITATIONS carrying, lifting, squatting, stairs, transfers, locomotion level, and caring for others  PARTICIPATION LIMITATIONS: meal prep, cleaning, laundry, driving, shopping, community activity, occupation, and yard work  PERSONAL Education officer, community, Past/current experiences, Transportation, and 1-2 comorbidities: dialysis on T/Th/Sat and chronic pain of R knee  are also affecting patient's functional outcome.   REHAB POTENTIAL: Good  CLINICAL DECISION MAKING: Evolving/moderate complexity  EVALUATION COMPLEXITY: Moderate  PLAN: PT FREQUENCY: 2x/week  PT DURATION: 8 weeks  PLANNED INTERVENTIONS: Therapeutic  exercises, Therapeutic activity, Neuromuscular re-education, Balance training, Gait training, Patient/Family education, Self Care, Joint mobilization, Stair training, Vestibular training, Canalith repositioning, Prosthetic training, DME instructions, Dry Needling, Cryotherapy, Moist heat, Compression bandaging, Taping, Manual therapy, and Re-evaluation  PLAN FOR NEXT SESSION: Goal assessment, continue gait training w/emphasis on L knee flexion w/toe off and midline orientation    Zahki Hoogendoorn E Kalana Yust, PT, DPT 03/16/2022, 11:01 AM

## 2022-03-17 DIAGNOSIS — R52 Pain, unspecified: Secondary | ICD-10-CM | POA: Diagnosis not present

## 2022-03-17 DIAGNOSIS — E1122 Type 2 diabetes mellitus with diabetic chronic kidney disease: Secondary | ICD-10-CM | POA: Diagnosis not present

## 2022-03-17 DIAGNOSIS — D509 Iron deficiency anemia, unspecified: Secondary | ICD-10-CM | POA: Diagnosis not present

## 2022-03-17 DIAGNOSIS — D631 Anemia in chronic kidney disease: Secondary | ICD-10-CM | POA: Diagnosis not present

## 2022-03-17 DIAGNOSIS — Z992 Dependence on renal dialysis: Secondary | ICD-10-CM | POA: Diagnosis not present

## 2022-03-17 DIAGNOSIS — L299 Pruritus, unspecified: Secondary | ICD-10-CM | POA: Diagnosis not present

## 2022-03-17 DIAGNOSIS — Z23 Encounter for immunization: Secondary | ICD-10-CM | POA: Diagnosis not present

## 2022-03-17 DIAGNOSIS — N2581 Secondary hyperparathyroidism of renal origin: Secondary | ICD-10-CM | POA: Diagnosis not present

## 2022-03-17 DIAGNOSIS — D689 Coagulation defect, unspecified: Secondary | ICD-10-CM | POA: Diagnosis not present

## 2022-03-17 DIAGNOSIS — N186 End stage renal disease: Secondary | ICD-10-CM | POA: Diagnosis not present

## 2022-03-18 ENCOUNTER — Ambulatory Visit: Payer: Medicare Other | Admitting: Physical Therapy

## 2022-03-19 DIAGNOSIS — D509 Iron deficiency anemia, unspecified: Secondary | ICD-10-CM | POA: Diagnosis not present

## 2022-03-19 DIAGNOSIS — N186 End stage renal disease: Secondary | ICD-10-CM | POA: Diagnosis not present

## 2022-03-19 DIAGNOSIS — Z23 Encounter for immunization: Secondary | ICD-10-CM | POA: Diagnosis not present

## 2022-03-19 DIAGNOSIS — D631 Anemia in chronic kidney disease: Secondary | ICD-10-CM | POA: Diagnosis not present

## 2022-03-19 DIAGNOSIS — R52 Pain, unspecified: Secondary | ICD-10-CM | POA: Diagnosis not present

## 2022-03-19 DIAGNOSIS — E1122 Type 2 diabetes mellitus with diabetic chronic kidney disease: Secondary | ICD-10-CM | POA: Diagnosis not present

## 2022-03-19 DIAGNOSIS — N2581 Secondary hyperparathyroidism of renal origin: Secondary | ICD-10-CM | POA: Diagnosis not present

## 2022-03-19 DIAGNOSIS — Z992 Dependence on renal dialysis: Secondary | ICD-10-CM | POA: Diagnosis not present

## 2022-03-19 DIAGNOSIS — L299 Pruritus, unspecified: Secondary | ICD-10-CM | POA: Diagnosis not present

## 2022-03-19 DIAGNOSIS — D689 Coagulation defect, unspecified: Secondary | ICD-10-CM | POA: Diagnosis not present

## 2022-03-22 DIAGNOSIS — Z992 Dependence on renal dialysis: Secondary | ICD-10-CM | POA: Diagnosis not present

## 2022-03-22 DIAGNOSIS — R52 Pain, unspecified: Secondary | ICD-10-CM | POA: Diagnosis not present

## 2022-03-22 DIAGNOSIS — Z23 Encounter for immunization: Secondary | ICD-10-CM | POA: Diagnosis not present

## 2022-03-22 DIAGNOSIS — N186 End stage renal disease: Secondary | ICD-10-CM | POA: Diagnosis not present

## 2022-03-22 DIAGNOSIS — N2581 Secondary hyperparathyroidism of renal origin: Secondary | ICD-10-CM | POA: Diagnosis not present

## 2022-03-22 DIAGNOSIS — D689 Coagulation defect, unspecified: Secondary | ICD-10-CM | POA: Diagnosis not present

## 2022-03-22 DIAGNOSIS — D631 Anemia in chronic kidney disease: Secondary | ICD-10-CM | POA: Diagnosis not present

## 2022-03-22 DIAGNOSIS — E1122 Type 2 diabetes mellitus with diabetic chronic kidney disease: Secondary | ICD-10-CM | POA: Diagnosis not present

## 2022-03-22 DIAGNOSIS — D509 Iron deficiency anemia, unspecified: Secondary | ICD-10-CM | POA: Diagnosis not present

## 2022-03-22 DIAGNOSIS — L299 Pruritus, unspecified: Secondary | ICD-10-CM | POA: Diagnosis not present

## 2022-03-23 ENCOUNTER — Ambulatory Visit: Payer: Medicare Other | Attending: Student in an Organized Health Care Education/Training Program

## 2022-03-23 DIAGNOSIS — R293 Abnormal posture: Secondary | ICD-10-CM | POA: Insufficient documentation

## 2022-03-23 DIAGNOSIS — R2681 Unsteadiness on feet: Secondary | ICD-10-CM | POA: Diagnosis not present

## 2022-03-23 DIAGNOSIS — M6281 Muscle weakness (generalized): Secondary | ICD-10-CM | POA: Insufficient documentation

## 2022-03-23 DIAGNOSIS — R2689 Other abnormalities of gait and mobility: Secondary | ICD-10-CM | POA: Insufficient documentation

## 2022-03-23 DIAGNOSIS — R296 Repeated falls: Secondary | ICD-10-CM | POA: Diagnosis not present

## 2022-03-23 NOTE — Therapy (Signed)
OUTPATIENT PHYSICAL THERAPY NEURO TREATMENT   Patient Name: [name] [name] MRN: [id number] DOB:02-Dec-1975, 46 y.o., female Today's Date: 03/23/2022   PCP: Riesa Pope, MD REFERRING PROVIDER: Aldine Contes, MD    PT End of Session - 03/23/22 1002     Visit Number 6    Number of Visits 17    Date for PT Re-Evaluation 04/22/22    Authorization Type UHC Medicare/Medicaid of [place]    PT Start Time 1008    PT Stop Time 1053    PT Time Calculation (min) 45 min    Equipment Utilized During Treatment Gait belt    Activity Tolerance Patient tolerated treatment well    Behavior During Therapy [facility] for tasks assessed/performed               Past Medical History:  Diagnosis Date   Abdominal muscle pain 09/08/2016   Abnormal Pap smear of cervix 2009   Abscess    history of multiple abscesses   Acute bilateral low back pain 02/13/2017   Acute blood loss anemia    Acute on chronic renal failure ([place]) 07/12/2012   Acute renal failure ([place]) 07/12/2012   Acute respiratory failure with hypoxia ([place]) 10/21/2019   Adjustment disorder with depressed mood 03/26/2017   AKI (acute kidney injury) ([place])    Anemia 10/21/2019   Anemia of chronic disease 2002   Anginal pain (HCC)    Anxiety    Panic attacks   Bilateral lower extremity edema 05/13/2016   Bipolar disorder ([place])    Cataract    B/L cataract   Cellulitis 05/21/2014   right eye   CHF (congestive heart failure) ([place])    Chronic bronchitis ([place])    "get it q yr" (05/13/2013)   Chronic pain    Chronic pain of right knee 09/08/2016   Dehiscence of amputation stump ([place])    Depression    Edema of lower extremity    Endocarditis 2002   subacute bacterial endocarditis.    Fall    Family history of anesthesia complication    "my mom has a hard time coming out from under"   Fibromyalgia    GERD (gastroesophageal reflux disease)    occ   Heart murmur    Herpes simplex type 1 infection 01/16/2018   History of  blood transfusion    "just low blood count" (05/13/2013)   Hyperlipidemia    Hypertension    Hypothyroidism    Hypothyroidism, adult 03/21/2014   Hypoxia    Leukocytosis    Muscle spasm 12/16/2020   Necrosis ([place])    and ulceration   Necrotizing fasciitis s/p OR debridements 07/06/2012   Obesity    OSA on CPAP    does not wear all the time   Peripheral neuropathy    Pneumonia    Pyelonephritis 12/31/2020   Routine adult health maintenance 12/08/2016   Severe protein-calorie malnutrition ([place])    Status post below knee amputation, left ([place]) 04/11/2017   Type II diabetes mellitus ([place])    Type  II   Vaginal irritation 03/14/2016   Weak pulse 07/21/2021   Wound dehiscence 09/04/2019   Past Surgical History:  Procedure Laterality Date   A/V FISTULAGRAM Left 05/12/2021   Procedure: A/V FISTULAGRAM;  Surgeon: Broadus John, MD;  Location: [place] [facility];  Service: Cardiovascular;  Laterality: Left;   ABDOMINAL AORTOGRAM W/LOWER EXTREMITY Left 03/19/2019   Procedure: ABDOMINAL AORTOGRAM W/LOWER EXTREMITY;  Surgeon: Serafina Mitchell, MD;  Location: [place] [facility];  Service: Cardiovascular;  Laterality: Left;   ABOVE KNEE LEG AMPUTATION Left 10/11/2019   wound dehisence    AIR/FLUID EXCHANGE Right 12/31/2019   Procedure: AIR/FLUID EXCHANGE;  Surgeon: Jalene Mullet, MD;  Location: [place];  Service: Ophthalmology;  Laterality: Right;   AMPUTATION Left 04/07/2017   Procedure: LEFT BELOW KNEE AMPUTATION;  Surgeon: Newt Minion, MD;  Location: [place];  Service: Orthopedics;  Laterality: Left;   AMPUTATION Left 10/11/2019   Procedure: LEFT ABOVE KNEE AMPUTATION;  Surgeon: Newt Minion, MD;  Location: [place];  Service: Orthopedics;  Laterality: Left;   AV FISTULA PLACEMENT Left 01/05/2021   Procedure: ARTERIOVENOUS (AV) FISTULA CREATION vs. GRAFT;  Surgeon: Angelia Mould, MD;  Location: [facility];  Service: Vascular;  Laterality: Left;   CATARACT EXTRACTION W/PHACO Right  02/11/2020   Procedure: CATARACT EXTRACTION PHACO AND INTRAOCULAR LENS PLACEMENT ([place]);  Surgeon: Jalene Mullet, MD;  Location: [place];  Service: Ophthalmology;  Laterality: Right;   EYE SURGERY     lazer   FISTULA SUPERFICIALIZATION Left 02/23/2021   Procedure: SUPERFICIALIZATION OF LEFT BRACHIOCEPHALIC FISTULA;  Surgeon: Angelia Mould, MD;  Location: [place];  Service: Vascular;  Laterality: Left;   GAS INSERTION Right 12/31/2019   Procedure: INSERTION OF GAS;  Surgeon: Jalene Mullet, MD;  Location: [place];  Service: Ophthalmology;  Laterality: Right;   INCISION AND DRAINAGE ABSCESS     multiple I&Ds   INCISION AND DRAINAGE ABSCESS Left 07/09/2012   Procedure: DRESSING CHANGE, THIGH WOUND;  Surgeon: Harl Bowie, MD;  Location: [place];  Service: General;  Laterality: Left;   INCISION AND DRAINAGE OF WOUND Left 07/07/2012   Procedure: IRRIGATION AND DEBRIDEMENT WOUND;  Surgeon: Harl Bowie, MD;  Location: [place];  Service: General;  Laterality: Left;   INCISION AND DRAINAGE PERIRECTAL ABSCESS Left 07/14/2012   Procedure: DEBRIDEMENT OF SKIN & SOFT TISSUE; DRESSING CHANGE UNDER ANESTHESIA;  Surgeon: Gayland Curry, MD,FACS;  Location: [place];  Service: General;  Laterality: Left;   INCISION AND DRAINAGE PERIRECTAL ABSCESS Left 07/16/2012   Procedure: I&D Left Thigh;  Surgeon: Gwenyth Ober, MD;  Location: [place];  Service: General;  Laterality: Left;   INCISION AND DRAINAGE PERIRECTAL ABSCESS N/A 01/05/2015   Procedure: IRRIGATION AND DEBRIDEMENT PERIRECTAL ABSCESS;  Surgeon: Donnie Mesa, MD;  Location: [place];  Service: General;  Laterality: N/A;   INSERTION OF DIALYSIS CATHETER Right 01/05/2021   Procedure: INSERTION OF DIALYSIS CATHETER;  Surgeon: Angelia Mould, MD;  Location: [place];  Service: Vascular;  Laterality: Right;   IRRIGATION AND DEBRIDEMENT ABSCESS Left 07/06/2012   Procedure: IRRIGATION AND DEBRIDEMENT ABSCESS BUTTOCKS AND THIGH;  Surgeon: Shann Medal, MD;  Location: [place];  Service: General;  Laterality: Left;   IRRIGATION AND DEBRIDEMENT ABSCESS Left 08/10/2012   Procedure: IRRIGATION AND DEBRIDEMENT ABSCESS;  Surgeon: Madilyn Hook, DO;  Location: [place];  Service: General;  Laterality: Left;   PARS PLANA VITRECTOMY Right 12/31/2019   Procedure: PARS PLANA VITRECTOMY WITH 25 GAUGE;  Surgeon: Jalene Mullet, MD;  Location: [place];  Service: Ophthalmology;  Laterality: Right;   PERIPHERAL VASCULAR INTERVENTION Left 05/12/2021   Procedure: PERIPHERAL VASCULAR INTERVENTION;  Surgeon: Broadus John, MD;  Location: [place] [facility];  Service: Cardiovascular;  Laterality: Left;  arm fistula   PHOTOCOAGULATION WITH LASER Right 12/31/2019   Procedure: PHOTOCOAGULATION WITH LASER;  Surgeon: Jalene Mullet, MD;  Location: [place];  Service: Ophthalmology;  Laterality: Right;  REPAIR OF COMPLEX TRACTION RETINAL DETACHMENT Right 12/31/2019   Procedure: REPAIR OF COMPLEX TRACTION RETINAL;  Surgeon: Jalene Mullet, MD;  Location: [place];  Service: Ophthalmology;  Laterality: Right;   STUMP REVISION Left 06/09/2017   Procedure: REVISION LEFT BELOW KNEE AMPUTATION;  Surgeon: Newt Minion, MD;  Location: [place];  Service: Orthopedics;  Laterality: Left;   STUMP REVISION Left 09/04/2019   Procedure: LEFT BELOW KNEE AMPUTATION REVISION;  Surgeon: Newt Minion, MD;  Location: [place];  Service: Orthopedics;  Laterality: Left;   STUMP REVISION Left 09/20/2019   Procedure: LEFT BELOW KNEE AMPUTATION REVISION;  Surgeon: Newt Minion, MD;  Location: [place];  Service: Orthopedics;  Laterality: Left;   TONSILLECTOMY  1994   Patient Active Problem List   Diagnosis Date Noted   Urgency of urination 09/12/2021   Hidradenitis suppurativa 02/24/2021   Cough 05/26/2020   Chronic pain syndrome 01/16/2020   Hx of AKA (above knee amputation), left (HCC)    Blurry vision, right eye 10/08/2019   Tobacco use 03/01/2019   Phantom limb pain ([place]) 11/08/2018    Current moderate episode of major depressive disorder (HCC)    Hyperkalemia    Anemia of chronic disease    Bipolar affective disorder ([place])    Heart failure with preserved ejection fraction ([place]) 02/09/2017   Healthcare maintenance 12/08/2016   End-stage renal disease on hemodialysis ([place]) 07/15/2016   OSA (obstructive sleep apnea) 05/13/2016   Morbid obesity due to excess calories ([place]) 11/27/2015   Subclinical hypothyroidism 04/25/2013   Type 2 diabetes mellitus ([place]) 07/12/2012   Carpal tunnel syndrome, bilateral 01/25/2012   Diabetic polyneuropathy associated with type 2 diabetes mellitus ([place]) 07/04/2011   Anxiety and depression 06/02/2011   GERD 12/06/2007   Hyperlipidemia 08/29/2007   Hypertension associated with diabetes ([employer]) 08/29/2007    ONSET DATE: 02/03/2022   REFERRING DIAG: Y65.993 (ICD-10-CM) - Hx of AKA (above knee amputation), left (HCC)   THERAPY DIAG:  Unsteadiness on feet  Other abnormalities of gait and mobility  Muscle weakness (generalized)  Abnormal posture  Rationale for Evaluation and Treatment Rehabilitation  SUBJECTIVE:                                                                                                                                                                                              SUBJECTIVE STATEMENT: Patient reports doing well. Did receive a call from Transplant and will be getting on the list soon, hopefully, per patient. Per patient, prosthetist rotated L knee inward, did not adjust length. This was a week and a half ago. Denies falls/near falls.  Pt accompanied by: self  PERTINENT HISTORY:  ESRD, HTN, Chronic Pain, s/p left AKA     PAIN:  Are you having pain? Yes: NPRS scale: 1-2/10 Pain location: R knee and low back Pain description: dull ache   PRECAUTIONS: Fall and Other: On dialysis T/Th/Sat. Fistula in L arm  PLOF: Independent with basic ADLs and Requires assistive device for independence  PATIENT  GOALS "I wanna work on walking and maybe strengthening my left knee"   OBJECTIVE:   TODAY'S TREATMENT:  Ther Ex  -scifit hills level 3 x10 mins B LE only for improved functional strength.   THERACT:  -goal assessment:  -5xSTS no UE: 1'09" (increased difficulty with initiating subsequent stand due to L LE placement)   -5xSTS with UE: 42.07" (L prosthetic would extend and patient would have to manually flex prior to initiating next stand)   -gait speed: .61ms or 2.0556fs    -blocked practice sit <> stand 2 x5 with 2" step biased under L LE   PATIENT EDUCATION: Education details: continue HEP Person educated: Patient Education method: Explanation, Demonstration, and Handouts Education comprehension: verbalized understanding   HOME EXERCISE PROGRAM: Access Code: [email or web address]    GOALS: Goals reviewed with patient? Yes  SHORT TERM GOALS: Target date: 03/18/2022  Pt will improve gait velocity to at least 2.56f34f with LRAD and S* for improved gait efficiency and endurance  Baseline: 1.92 ft/s w/[place] and CGA; 2.31f76for .21m/67mal status: IN PROGRESS  2.  Pt will improve 5 x STS to less than or equal to 23 seconds w/BUE support to demonstrate improved functional strength and transfer efficiency.   Baseline: 28.78s w/BUE support; 42.07s with B UE (increased time needed to manage L knee flexion prior to next stand)  Goal status: NOT MET  3.  2MWT to be performed and LTG written  Baseline:  Goal status: MET   LONG TERM GOALS: Target date: 04/15/2022  Pt will be independent with final HEP for improved strength, balance, transfers and gait.  Baseline:  Goal status: INITIAL  2.  Pt will improve 5 x STS to less than or equal to 20 seconds w/BUE support to demonstrate improved functional strength and transfer efficiency.   Baseline: 28.78s w/BUE support Goal status: INITIAL  3.  Pt will ambulate >/= 260' on 2MWT w/LRAD mod I for improved cardiovascular endurance and  functional mobility  Baseline: 217' w/[place] and S*  Goal status: REVISED  4.  Pt will improve gait velocity to at least 2.5 ft/s w/LRAD mod I for improved gait efficiency   Baseline:  Goal status: INITIAL   ASSESSMENT:  CLINICAL IMPRESSION: Patient seen for skilled PT session with emphasis on functional strengthening and goal assessment. 10 Meter Walk Test: Patient instructed to walk 10 meters (32.8 ft) as quickly and as safely as possible at their normal speed x2 and at a fast speed x2. Time measured from 2 meter mark to 8 meter mark to accommodate ramp-up and ramp-down.  Normal speed: .21m/s60m off scores: <0.4 m/s = household Ambulator, 0.4-0.8 m/s = limited community Ambulator, >0.8 m/s = community Ambulator, >1.2 m/s = crossing a street, <1.0 = increased fall risk MCID 0.05 m/s (small), 0.13 m/s (moderate), 0.06 m/s (significant)  (ANPTA Core Set of Outcome Measures for Adults with Neurologic Conditions, 2018). Five times Sit to Stand Test (FTSS) Method: Use a straight back chair with a solid seat that is 17-18" high. Ask participant to sit on the chair with arms folded across their chest.  Instructions: "Stand up and sit down as quickly as possible 5 times, keeping your arms folded across your chest."   Measurement: Stop timing when the participant touches the chair in sitting the 5th time.  TIME: 42.07 sec (increased time due to need to manage L knee flexion prior to initiating next stand)   Cut off scores indicative of increased fall risk: >12 sec CVA, >16 sec PD, >13 sec vestibular (ANPTA Core Set of Outcome Measures for Adults with Neurologic Conditions, 2018). Continue POC.    OBJECTIVE IMPAIRMENTS Abnormal gait, decreased activity tolerance, decreased endurance, decreased knowledge of use of DME, decreased mobility, difficulty walking, decreased strength, increased edema, prosthetic dependency , obesity, and pain.   ACTIVITY LIMITATIONS carrying, lifting,  squatting, stairs, transfers, locomotion level, and caring for others  PARTICIPATION LIMITATIONS: meal prep, cleaning, laundry, driving, shopping, community activity, occupation, and yard work  PERSONAL Education officer, community, Past/current experiences, Transportation, and 1-2 comorbidities: dialysis on T/Th/Sat and chronic pain of R knee  are also affecting patient's functional outcome.   REHAB POTENTIAL: Good  CLINICAL DECISION MAKING: Evolving/moderate complexity  EVALUATION COMPLEXITY: Moderate  PLAN: PT FREQUENCY: 2x/week  PT DURATION: 8 weeks  PLANNED INTERVENTIONS: Therapeutic exercises, Therapeutic activity, Neuromuscular re-education, Balance training, Gait training, Patient/Family education, Self Care, Joint mobilization, Stair training, Vestibular training, Canalith repositioning, Prosthetic training, DME instructions, Dry Needling, Cryotherapy, Moist heat, Compression bandaging, Taping, Manual therapy, and Re-evaluation  PLAN FOR NEXT SESSION:  continue gait training w/emphasis on L knee flexion w/toe off and midline orientation, sit <> stand with no UE (needed to kidney transplant)    Debbora Dus, PT, DPT Debbora Dus, PT, DPT, CBIS  03/23/2022, 10:55 AM

## 2022-03-24 DIAGNOSIS — D631 Anemia in chronic kidney disease: Secondary | ICD-10-CM | POA: Diagnosis not present

## 2022-03-24 DIAGNOSIS — Z992 Dependence on renal dialysis: Secondary | ICD-10-CM | POA: Diagnosis not present

## 2022-03-24 DIAGNOSIS — N186 End stage renal disease: Secondary | ICD-10-CM | POA: Diagnosis not present

## 2022-03-24 DIAGNOSIS — D689 Coagulation defect, unspecified: Secondary | ICD-10-CM | POA: Diagnosis not present

## 2022-03-24 DIAGNOSIS — D509 Iron deficiency anemia, unspecified: Secondary | ICD-10-CM | POA: Diagnosis not present

## 2022-03-24 DIAGNOSIS — L299 Pruritus, unspecified: Secondary | ICD-10-CM | POA: Diagnosis not present

## 2022-03-24 DIAGNOSIS — N2581 Secondary hyperparathyroidism of renal origin: Secondary | ICD-10-CM | POA: Diagnosis not present

## 2022-03-25 ENCOUNTER — Ambulatory Visit: Payer: Medicare Other | Admitting: Physical Therapy

## 2022-03-26 DIAGNOSIS — N186 End stage renal disease: Secondary | ICD-10-CM | POA: Diagnosis not present

## 2022-03-26 DIAGNOSIS — D631 Anemia in chronic kidney disease: Secondary | ICD-10-CM | POA: Diagnosis not present

## 2022-03-26 DIAGNOSIS — L299 Pruritus, unspecified: Secondary | ICD-10-CM | POA: Diagnosis not present

## 2022-03-26 DIAGNOSIS — D509 Iron deficiency anemia, unspecified: Secondary | ICD-10-CM | POA: Diagnosis not present

## 2022-03-26 DIAGNOSIS — N2581 Secondary hyperparathyroidism of renal origin: Secondary | ICD-10-CM | POA: Diagnosis not present

## 2022-03-26 DIAGNOSIS — D689 Coagulation defect, unspecified: Secondary | ICD-10-CM | POA: Diagnosis not present

## 2022-03-26 DIAGNOSIS — Z992 Dependence on renal dialysis: Secondary | ICD-10-CM | POA: Diagnosis not present

## 2022-03-29 DIAGNOSIS — D689 Coagulation defect, unspecified: Secondary | ICD-10-CM | POA: Diagnosis not present

## 2022-03-29 DIAGNOSIS — Z992 Dependence on renal dialysis: Secondary | ICD-10-CM | POA: Diagnosis not present

## 2022-03-29 DIAGNOSIS — N186 End stage renal disease: Secondary | ICD-10-CM | POA: Diagnosis not present

## 2022-03-29 DIAGNOSIS — D509 Iron deficiency anemia, unspecified: Secondary | ICD-10-CM | POA: Diagnosis not present

## 2022-03-29 DIAGNOSIS — L299 Pruritus, unspecified: Secondary | ICD-10-CM | POA: Diagnosis not present

## 2022-03-29 DIAGNOSIS — N2581 Secondary hyperparathyroidism of renal origin: Secondary | ICD-10-CM | POA: Diagnosis not present

## 2022-03-29 DIAGNOSIS — D631 Anemia in chronic kidney disease: Secondary | ICD-10-CM | POA: Diagnosis not present

## 2022-03-30 ENCOUNTER — Ambulatory Visit: Payer: Medicare Other

## 2022-03-30 DIAGNOSIS — R296 Repeated falls: Secondary | ICD-10-CM

## 2022-03-30 DIAGNOSIS — R293 Abnormal posture: Secondary | ICD-10-CM | POA: Diagnosis not present

## 2022-03-30 DIAGNOSIS — M6281 Muscle weakness (generalized): Secondary | ICD-10-CM

## 2022-03-30 DIAGNOSIS — R2689 Other abnormalities of gait and mobility: Secondary | ICD-10-CM | POA: Diagnosis not present

## 2022-03-30 DIAGNOSIS — R2681 Unsteadiness on feet: Secondary | ICD-10-CM

## 2022-03-30 NOTE — Therapy (Signed)
OUTPATIENT PHYSICAL THERAPY NEURO TREATMENT   Patient Name: [name] [name] MRN: [id number] DOB:Oct 18, 1975, 46 y.o., female Today's Date: 03/30/2022   PCP: Riesa Pope, MD REFERRING PROVIDER: Aldine Contes, MD    PT End of Session - 03/30/22 1019     Visit Number 7    Number of Visits 17    Date for PT Re-Evaluation 04/22/22    Authorization Type UHC Medicare/Medicaid of [place]    PT Start Time 1016    PT Stop Time 1057    PT Time Calculation (min) 41 min    Equipment Utilized During Treatment Gait belt    Activity Tolerance Patient tolerated treatment well    Behavior During Therapy [facility] for tasks assessed/performed               Past Medical History:  Diagnosis Date   Abdominal muscle pain 09/08/2016   Abnormal Pap smear of cervix 2009   Abscess    history of multiple abscesses   Acute bilateral low back pain 02/13/2017   Acute blood loss anemia    Acute on chronic renal failure ([place]) 07/12/2012   Acute renal failure ([place]) 07/12/2012   Acute respiratory failure with hypoxia ([place]) 10/21/2019   Adjustment disorder with depressed mood 03/26/2017   AKI (acute kidney injury) ([place])    Anemia 10/21/2019   Anemia of chronic disease 2002   Anginal pain (HCC)    Anxiety    Panic attacks   Bilateral lower extremity edema 05/13/2016   Bipolar disorder ([place])    Cataract    B/L cataract   Cellulitis 05/21/2014   right eye   CHF (congestive heart failure) ([place])    Chronic bronchitis ([place])    "get it q yr" (05/13/2013)   Chronic pain    Chronic pain of right knee 09/08/2016   Dehiscence of amputation stump (HCC)    Depression    Edema of lower extremity    Endocarditis 2002   subacute bacterial endocarditis.    Fall    Family history of anesthesia complication    "my mom has a hard time coming out from under"   Fibromyalgia    GERD (gastroesophageal reflux disease)    occ   Heart murmur    Herpes simplex type 1 infection 01/16/2018   History of  blood transfusion    "just low blood count" (05/13/2013)   Hyperlipidemia    Hypertension    Hypothyroidism    Hypothyroidism, adult 03/21/2014   Hypoxia    Leukocytosis    Muscle spasm 12/16/2020   Necrosis ([place])    and ulceration   Necrotizing fasciitis s/p OR debridements 07/06/2012   Obesity    OSA on CPAP    does not wear all the time   Peripheral neuropathy    Pneumonia    Pyelonephritis 12/31/2020   Routine adult health maintenance 12/08/2016   Severe protein-calorie malnutrition ([place])    Status post below knee amputation, left ([place]) 04/11/2017   Type II diabetes mellitus ([place])    Type  II   Vaginal irritation 03/14/2016   Weak pulse 07/21/2021   Wound dehiscence 09/04/2019   Past Surgical History:  Procedure Laterality Date   A/V FISTULAGRAM Left 05/12/2021   Procedure: A/V FISTULAGRAM;  Surgeon: Broadus John, MD;  Location: [place] [facility];  Service: Cardiovascular;  Laterality: Left;   ABDOMINAL AORTOGRAM W/LOWER EXTREMITY Left 03/19/2019   Procedure: ABDOMINAL AORTOGRAM W/LOWER EXTREMITY;  Surgeon: Serafina Mitchell, MD;  Location: [place] [facility];  Service: Cardiovascular;  Laterality: Left;   ABOVE KNEE LEG AMPUTATION Left 10/11/2019   wound dehisence    AIR/FLUID EXCHANGE Right 12/31/2019   Procedure: AIR/FLUID EXCHANGE;  Surgeon: Jalene Mullet, MD;  Location: [place];  Service: Ophthalmology;  Laterality: Right;   AMPUTATION Left 04/07/2017   Procedure: LEFT BELOW KNEE AMPUTATION;  Surgeon: Newt Minion, MD;  Location: [place];  Service: Orthopedics;  Laterality: Left;   AMPUTATION Left 10/11/2019   Procedure: LEFT ABOVE KNEE AMPUTATION;  Surgeon: Newt Minion, MD;  Location: [place];  Service: Orthopedics;  Laterality: Left;   AV FISTULA PLACEMENT Left 01/05/2021   Procedure: ARTERIOVENOUS (AV) FISTULA CREATION vs. GRAFT;  Surgeon: Angelia Mould, MD;  Location: [facility];  Service: Vascular;  Laterality: Left;   CATARACT EXTRACTION W/PHACO Right  02/11/2020   Procedure: CATARACT EXTRACTION PHACO AND INTRAOCULAR LENS PLACEMENT ([place]);  Surgeon: Jalene Mullet, MD;  Location: [place];  Service: Ophthalmology;  Laterality: Right;   EYE SURGERY     lazer   FISTULA SUPERFICIALIZATION Left 02/23/2021   Procedure: SUPERFICIALIZATION OF LEFT BRACHIOCEPHALIC FISTULA;  Surgeon: Angelia Mould, MD;  Location: [place];  Service: Vascular;  Laterality: Left;   GAS INSERTION Right 12/31/2019   Procedure: INSERTION OF GAS;  Surgeon: Jalene Mullet, MD;  Location: [place];  Service: Ophthalmology;  Laterality: Right;   INCISION AND DRAINAGE ABSCESS     multiple I&Ds   INCISION AND DRAINAGE ABSCESS Left 07/09/2012   Procedure: DRESSING CHANGE, THIGH WOUND;  Surgeon: Harl Bowie, MD;  Location: [place];  Service: General;  Laterality: Left;   INCISION AND DRAINAGE OF WOUND Left 07/07/2012   Procedure: IRRIGATION AND DEBRIDEMENT WOUND;  Surgeon: Harl Bowie, MD;  Location: [place];  Service: General;  Laterality: Left;   INCISION AND DRAINAGE PERIRECTAL ABSCESS Left 07/14/2012   Procedure: DEBRIDEMENT OF SKIN & SOFT TISSUE; DRESSING CHANGE UNDER ANESTHESIA;  Surgeon: Gayland Curry, MD,FACS;  Location: [place];  Service: General;  Laterality: Left;   INCISION AND DRAINAGE PERIRECTAL ABSCESS Left 07/16/2012   Procedure: I&D Left Thigh;  Surgeon: Gwenyth Ober, MD;  Location: [place];  Service: General;  Laterality: Left;   INCISION AND DRAINAGE PERIRECTAL ABSCESS N/A 01/05/2015   Procedure: IRRIGATION AND DEBRIDEMENT PERIRECTAL ABSCESS;  Surgeon: Donnie Mesa, MD;  Location: [place];  Service: General;  Laterality: N/A;   INSERTION OF DIALYSIS CATHETER Right 01/05/2021   Procedure: INSERTION OF DIALYSIS CATHETER;  Surgeon: Angelia Mould, MD;  Location: [place];  Service: Vascular;  Laterality: Right;   IRRIGATION AND DEBRIDEMENT ABSCESS Left 07/06/2012   Procedure: IRRIGATION AND DEBRIDEMENT ABSCESS BUTTOCKS AND THIGH;  Surgeon: Shann Medal, MD;  Location: [place];  Service: General;  Laterality: Left;   IRRIGATION AND DEBRIDEMENT ABSCESS Left 08/10/2012   Procedure: IRRIGATION AND DEBRIDEMENT ABSCESS;  Surgeon: Madilyn Hook, DO;  Location: [place];  Service: General;  Laterality: Left;   PARS PLANA VITRECTOMY Right 12/31/2019   Procedure: PARS PLANA VITRECTOMY WITH 25 GAUGE;  Surgeon: Jalene Mullet, MD;  Location: [place];  Service: Ophthalmology;  Laterality: Right;   PERIPHERAL VASCULAR INTERVENTION Left 05/12/2021   Procedure: PERIPHERAL VASCULAR INTERVENTION;  Surgeon: Broadus John, MD;  Location: [place] [facility];  Service: Cardiovascular;  Laterality: Left;  arm fistula   PHOTOCOAGULATION WITH LASER Right 12/31/2019   Procedure: PHOTOCOAGULATION WITH LASER;  Surgeon: Jalene Mullet, MD;  Location: [place];  Service: Ophthalmology;  Laterality: Right;  REPAIR OF COMPLEX TRACTION RETINAL DETACHMENT Right 12/31/2019   Procedure: REPAIR OF COMPLEX TRACTION RETINAL;  Surgeon: Jalene Mullet, MD;  Location: [place];  Service: Ophthalmology;  Laterality: Right;   STUMP REVISION Left 06/09/2017   Procedure: REVISION LEFT BELOW KNEE AMPUTATION;  Surgeon: Newt Minion, MD;  Location: [place];  Service: Orthopedics;  Laterality: Left;   STUMP REVISION Left 09/04/2019   Procedure: LEFT BELOW KNEE AMPUTATION REVISION;  Surgeon: Newt Minion, MD;  Location: [place];  Service: Orthopedics;  Laterality: Left;   STUMP REVISION Left 09/20/2019   Procedure: LEFT BELOW KNEE AMPUTATION REVISION;  Surgeon: Newt Minion, MD;  Location: [place];  Service: Orthopedics;  Laterality: Left;   TONSILLECTOMY  1994   Patient Active Problem List   Diagnosis Date Noted   Urgency of urination 09/12/2021   Hidradenitis suppurativa 02/24/2021   Cough 05/26/2020   Chronic pain syndrome 01/16/2020   Hx of AKA (above knee amputation), left (HCC)    Blurry vision, right eye 10/08/2019   Tobacco use 03/01/2019   Phantom limb pain ([place]) 11/08/2018    Current moderate episode of major depressive disorder (HCC)    Hyperkalemia    Anemia of chronic disease    Bipolar affective disorder ([place])    Heart failure with preserved ejection fraction ([place]) 02/09/2017   Healthcare maintenance 12/08/2016   End-stage renal disease on hemodialysis ([place]) 07/15/2016   OSA (obstructive sleep apnea) 05/13/2016   Morbid obesity due to excess calories ([place]) 11/27/2015   Subclinical hypothyroidism 04/25/2013   Type 2 diabetes mellitus ([place]) 07/12/2012   Carpal tunnel syndrome, bilateral 01/25/2012   Diabetic polyneuropathy associated with type 2 diabetes mellitus ([place]) 07/04/2011   Anxiety and depression 06/02/2011   GERD 12/06/2007   Hyperlipidemia 08/29/2007   Hypertension associated with diabetes ([place]) 08/29/2007    ONSET DATE: 02/03/2022   REFERRING DIAG: T62.563 (ICD-10-CM) - Hx of AKA (above knee amputation), left (HCC)   THERAPY DIAG:  Unsteadiness on feet  Other abnormalities of gait and mobility  Abnormal posture  Muscle weakness (generalized)  Repeated falls  Rationale for Evaluation and Treatment Rehabilitation  SUBJECTIVE:                                                                                                                                                                                              SUBJECTIVE STATEMENT: Patient reports doing well. Back and knee feeling okay. Denies falls/near falls. Very tired today.   Pt accompanied by: self  PERTINENT HISTORY:  ESRD, HTN, Chronic Pain, s/p left AKA     PAIN:  Are you having  pain? Yes: NPRS scale: 1-2/10 Pain location: R knee and low back Pain description: dull ache   PRECAUTIONS: Fall and Other: On dialysis T/Th/Sat. Fistula in L arm  PLOF: Independent with basic ADLs and Requires assistive device for independence  PATIENT GOALS "I wanna work on walking and maybe strengthening my left knee"   OBJECTIVE:   TODAY'S TREATMENT:  Ther Ex  -scifit hills  level 3 x10 mins B LE only for improved functional strength.  -hooklying bridges 2x12  -LTR with B LE on physioball 2x20  -blocked practice sit <> stand with no UE from elevated mat height and progressively lowering mat height  -130' gait with emphasis on L glute and glute med engagement during stance phase lofstrand + close supervision   PATIENT EDUCATION: Education details: continue HEP Person educated: Patient Education method: Explanation, Demonstration, and Handouts Education comprehension: verbalized understanding   HOME EXERCISE PROGRAM: Access Code: [email or web address]    GOALS: Goals reviewed with patient? Yes  SHORT TERM GOALS: Target date: 03/18/2022  Pt will improve gait velocity to at least 2.5f/s with LRAD and S* for improved gait efficiency and endurance  Baseline: 1.92 ft/s w/Lofstrand and CGA; 2.091fs or .6256mGoal status: IN PROGRESS  2.  Pt will improve 5 x STS to less than or equal to 23 seconds w/BUE support to demonstrate improved functional strength and transfer efficiency.   Baseline: 28.78s w/BUE support; 42.07s with B UE (increased time needed to manage L knee flexion prior to next stand)  Goal status: NOT MET  3.  2MWT to be performed and LTG written  Baseline:  Goal status: MET   LONG TERM GOALS: Target date: 04/15/2022  Pt will be independent with final HEP for improved strength, balance, transfers and gait.  Baseline:  Goal status: INITIAL  2.  Pt will improve 5 x STS to less than or equal to 20 seconds w/BUE support to demonstrate improved functional strength and transfer efficiency.   Baseline: 28.78s w/BUE support Goal status: INITIAL  3.  Pt will ambulate >/= 260' on 2MWT w/LRAD mod I for improved cardiovascular endurance and functional mobility  Baseline: 217' w/Lofstrand and S*  Goal status: REVISED  4.  Pt will improve gait velocity to at least 2.5 ft/s w/LRAD mod I for improved gait efficiency   Baseline:  Goal status:  INITIAL   ASSESSMENT:  CLINICAL IMPRESSION: Patient seen for skilled PT session with emphasis on functional strengthening. Tolerated therex well with no increase in pain. Is improving ability to stand without using UE support, but does need to stand from an elevated mat height. Gait mechanics improving with cue to engage L glute and glute med during stance phase. Continue POC.    OBJECTIVE IMPAIRMENTS Abnormal gait, decreased activity tolerance, decreased endurance, decreased knowledge of use of DME, decreased mobility, difficulty walking, decreased strength, increased edema, prosthetic dependency , obesity, and pain.   ACTIVITY LIMITATIONS carrying, lifting, squatting, stairs, transfers, locomotion level, and caring for others  PARTICIPATION LIMITATIONS: meal prep, cleaning, laundry, driving, shopping, community activity, occupation, and yard work  PERSONAL FACEducation officer, communityast/current experiences, Transportation, and 1-2 comorbidities: dialysis on T/Th/Sat and chronic pain of R knee  are also affecting patient's functional outcome.   REHAB POTENTIAL: Good  CLINICAL DECISION MAKING: Evolving/moderate complexity  EVALUATION COMPLEXITY: Moderate  PLAN: PT FREQUENCY: 2x/week  PT DURATION: 8 weeks  PLANNED INTERVENTIONS: Therapeutic exercises, Therapeutic activity, Neuromuscular re-education, Balance training, Gait training, Patient/Family education, Self Care, Joint mobilization, Stair training, Vestibular training, Canalith repositioning,  Prosthetic training, DME instructions, Dry Needling, Cryotherapy, Moist heat, Compression bandaging, Taping, Manual therapy, and Re-evaluation  PLAN FOR NEXT SESSION:  continue gait training w/emphasis on L knee flexion w/toe off and midline orientation, sit <> stand with no UE (needed to kidney transplant)    Debbora Dus, PT, DPT Debbora Dus, PT, DPT, CBIS  03/30/2022, 11:00 AM

## 2022-03-31 DIAGNOSIS — N186 End stage renal disease: Secondary | ICD-10-CM | POA: Diagnosis not present

## 2022-03-31 DIAGNOSIS — D689 Coagulation defect, unspecified: Secondary | ICD-10-CM | POA: Diagnosis not present

## 2022-03-31 DIAGNOSIS — Z992 Dependence on renal dialysis: Secondary | ICD-10-CM | POA: Diagnosis not present

## 2022-03-31 DIAGNOSIS — L299 Pruritus, unspecified: Secondary | ICD-10-CM | POA: Diagnosis not present

## 2022-03-31 DIAGNOSIS — D631 Anemia in chronic kidney disease: Secondary | ICD-10-CM | POA: Diagnosis not present

## 2022-03-31 DIAGNOSIS — D509 Iron deficiency anemia, unspecified: Secondary | ICD-10-CM | POA: Diagnosis not present

## 2022-03-31 DIAGNOSIS — N2581 Secondary hyperparathyroidism of renal origin: Secondary | ICD-10-CM | POA: Diagnosis not present

## 2022-04-01 ENCOUNTER — Encounter: Payer: Self-pay | Admitting: Physical Therapy

## 2022-04-01 ENCOUNTER — Telehealth: Payer: Self-pay | Admitting: Physical Therapy

## 2022-04-01 ENCOUNTER — Ambulatory Visit: Payer: Medicare Other

## 2022-04-01 ENCOUNTER — Ambulatory Visit: Payer: Medicare Other | Admitting: Physical Therapy

## 2022-04-01 DIAGNOSIS — R293 Abnormal posture: Secondary | ICD-10-CM | POA: Diagnosis not present

## 2022-04-01 DIAGNOSIS — R2681 Unsteadiness on feet: Secondary | ICD-10-CM

## 2022-04-01 DIAGNOSIS — R2689 Other abnormalities of gait and mobility: Secondary | ICD-10-CM | POA: Diagnosis not present

## 2022-04-01 DIAGNOSIS — R296 Repeated falls: Secondary | ICD-10-CM | POA: Diagnosis not present

## 2022-04-01 DIAGNOSIS — M6281 Muscle weakness (generalized): Secondary | ICD-10-CM | POA: Diagnosis not present

## 2022-04-01 NOTE — Therapy (Signed)
OUTPATIENT PHYSICAL THERAPY NEURO TREATMENT   Patient Name: [name] [name] MRN: [id number] DOB:07-13-1975, 46 y.o., female Today's Date: 04/01/2022   PCP: Riesa Pope, MD REFERRING PROVIDER: Aldine Contes, MD    PT End of Session - 04/01/22 1010     Visit Number 8    Number of Visits 17    Date for PT Re-Evaluation 04/22/22    Authorization Type UHC Medicare/Medicaid of [place]    PT Start Time 1015    PT Stop Time 1100    PT Time Calculation (min) 45 min    Equipment Utilized During Treatment Gait belt    Activity Tolerance Patient tolerated treatment well    Behavior During Therapy [facility] for tasks assessed/performed             Past Medical History:  Diagnosis Date   Abdominal muscle pain 09/08/2016   Abnormal Pap smear of cervix 2009   Abscess    history of multiple abscesses   Acute bilateral low back pain 02/13/2017   Acute blood loss anemia    Acute on chronic renal failure ([place]) 07/12/2012   Acute renal failure ([place]) 07/12/2012   Acute respiratory failure with hypoxia ([place]) 10/21/2019   Adjustment disorder with depressed mood 03/26/2017   AKI (acute kidney injury) ([place])    Anemia 10/21/2019   Anemia of chronic disease 2002   Anginal pain (HCC)    Anxiety    Panic attacks   Bilateral lower extremity edema 05/13/2016   Bipolar disorder ([place])    Cataract    B/L cataract   Cellulitis 05/21/2014   right eye   CHF (congestive heart failure) ([place])    Chronic bronchitis ([place])    "get it q yr" (05/13/2013)   Chronic pain    Chronic pain of right knee 09/08/2016   Dehiscence of amputation stump (HCC)    Depression    Edema of lower extremity    Endocarditis 2002   subacute bacterial endocarditis.    Fall    Family history of anesthesia complication    "my mom has a hard time coming out from under"   Fibromyalgia    GERD (gastroesophageal reflux disease)    occ   Heart murmur    Herpes simplex type 1 infection 01/16/2018   History of blood  transfusion    "just low blood count" (05/13/2013)   Hyperlipidemia    Hypertension    Hypothyroidism    Hypothyroidism, adult 03/21/2014   Hypoxia    Leukocytosis    Muscle spasm 12/16/2020   Necrosis ([place])    and ulceration   Necrotizing fasciitis s/p OR debridements 07/06/2012   Obesity    OSA on CPAP    does not wear all the time   Peripheral neuropathy    Pneumonia    Pyelonephritis 12/31/2020   Routine adult health maintenance 12/08/2016   Severe protein-calorie malnutrition ([place])    Status post below knee amputation, left ([place]) 04/11/2017   Type II diabetes mellitus ([place])    Type  II   Vaginal irritation 03/14/2016   Weak pulse 07/21/2021   Wound dehiscence 09/04/2019   Past Surgical History:  Procedure Laterality Date   A/V FISTULAGRAM Left 05/12/2021   Procedure: A/V FISTULAGRAM;  Surgeon: Broadus John, MD;  Location: [facility];  Service: Cardiovascular;  Laterality: Left;   ABDOMINAL AORTOGRAM W/LOWER EXTREMITY Left 03/19/2019   Procedure: ABDOMINAL AORTOGRAM W/LOWER EXTREMITY;  Surgeon: Serafina Mitchell, MD;  Location: [place] [facility];  Service: Cardiovascular;  Laterality: Left;   ABOVE KNEE LEG AMPUTATION Left 10/11/2019   wound dehisence    AIR/FLUID EXCHANGE Right 12/31/2019   Procedure: AIR/FLUID EXCHANGE;  Surgeon: Jalene Mullet, MD;  Location: [place];  Service: Ophthalmology;  Laterality: Right;   AMPUTATION Left 04/07/2017   Procedure: LEFT BELOW KNEE AMPUTATION;  Surgeon: Newt Minion, MD;  Location: [place];  Service: Orthopedics;  Laterality: Left;   AMPUTATION Left 10/11/2019   Procedure: LEFT ABOVE KNEE AMPUTATION;  Surgeon: Newt Minion, MD;  Location: [place];  Service: Orthopedics;  Laterality: Left;   AV FISTULA PLACEMENT Left 01/05/2021   Procedure: ARTERIOVENOUS (AV) FISTULA CREATION vs. GRAFT;  Surgeon: Angelia Mould, MD;  Location: [facility];  Service: Vascular;  Laterality: Left;   CATARACT EXTRACTION W/PHACO Right  02/11/2020   Procedure: CATARACT EXTRACTION PHACO AND INTRAOCULAR LENS PLACEMENT ([place]);  Surgeon: Jalene Mullet, MD;  Location: [place];  Service: Ophthalmology;  Laterality: Right;   EYE SURGERY     lazer   FISTULA SUPERFICIALIZATION Left 02/23/2021   Procedure: SUPERFICIALIZATION OF LEFT BRACHIOCEPHALIC FISTULA;  Surgeon: Angelia Mould, MD;  Location: [place];  Service: Vascular;  Laterality: Left;   GAS INSERTION Right 12/31/2019   Procedure: INSERTION OF GAS;  Surgeon: Jalene Mullet, MD;  Location: [place];  Service: Ophthalmology;  Laterality: Right;   INCISION AND DRAINAGE ABSCESS     multiple I&Ds   INCISION AND DRAINAGE ABSCESS Left 07/09/2012   Procedure: DRESSING CHANGE, THIGH WOUND;  Surgeon: Harl Bowie, MD;  Location: [facility];  Service: General;  Laterality: Left;   INCISION AND DRAINAGE OF WOUND Left 07/07/2012   Procedure: IRRIGATION AND DEBRIDEMENT WOUND;  Surgeon: Harl Bowie, MD;  Location: [place];  Service: General;  Laterality: Left;   INCISION AND DRAINAGE PERIRECTAL ABSCESS Left 07/14/2012   Procedure: DEBRIDEMENT OF SKIN & SOFT TISSUE; DRESSING CHANGE UNDER ANESTHESIA;  Surgeon: Gayland Curry, MD,FACS;  Location: [place];  Service: General;  Laterality: Left;   INCISION AND DRAINAGE PERIRECTAL ABSCESS Left 07/16/2012   Procedure: I&D Left Thigh;  Surgeon: Gwenyth Ober, MD;  Location: [facility];  Service: General;  Laterality: Left;   INCISION AND DRAINAGE PERIRECTAL ABSCESS N/A 01/05/2015   Procedure: IRRIGATION AND DEBRIDEMENT PERIRECTAL ABSCESS;  Surgeon: Donnie Mesa, MD;  Location: [facility];  Service: General;  Laterality: N/A;   INSERTION OF DIALYSIS CATHETER Right 01/05/2021   Procedure: INSERTION OF DIALYSIS CATHETER;  Surgeon: Angelia Mould, MD;  Location: [facility];  Service: Vascular;  Laterality: Right;   IRRIGATION AND DEBRIDEMENT ABSCESS Left 07/06/2012   Procedure: IRRIGATION AND DEBRIDEMENT ABSCESS BUTTOCKS AND THIGH;  Surgeon: Shann Medal, MD;  Location: [place];  Service: General;  Laterality: Left;   IRRIGATION AND DEBRIDEMENT ABSCESS Left 08/10/2012   Procedure: IRRIGATION AND DEBRIDEMENT ABSCESS;  Surgeon: Madilyn Hook, DO;  Location: [facility];  Service: General;  Laterality: Left;   PARS PLANA VITRECTOMY Right 12/31/2019   Procedure: PARS PLANA VITRECTOMY WITH 25 GAUGE;  Surgeon: Jalene Mullet, MD;  Location: [facility];  Service: Ophthalmology;  Laterality: Right;   PERIPHERAL VASCULAR INTERVENTION Left 05/12/2021   Procedure: PERIPHERAL VASCULAR INTERVENTION;  Surgeon: Broadus John, MD;  Location: [facility];  Service: Cardiovascular;  Laterality: Left;  arm fistula   PHOTOCOAGULATION WITH LASER Right 12/31/2019   Procedure: PHOTOCOAGULATION WITH LASER;  Surgeon: Jalene Mullet, MD;  Location: [place];  Service: Ophthalmology;  Laterality: Right;   REPAIR  OF COMPLEX TRACTION RETINAL DETACHMENT Right 12/31/2019   Procedure: REPAIR OF COMPLEX TRACTION RETINAL;  Surgeon: Jalene Mullet, MD;  Location: [place];  Service: Ophthalmology;  Laterality: Right;   STUMP REVISION Left 06/09/2017   Procedure: REVISION LEFT BELOW KNEE AMPUTATION;  Surgeon: Newt Minion, MD;  Location: [place];  Service: Orthopedics;  Laterality: Left;   STUMP REVISION Left 09/04/2019   Procedure: LEFT BELOW KNEE AMPUTATION REVISION;  Surgeon: Newt Minion, MD;  Location: [place];  Service: Orthopedics;  Laterality: Left;   STUMP REVISION Left 09/20/2019   Procedure: LEFT BELOW KNEE AMPUTATION REVISION;  Surgeon: Newt Minion, MD;  Location: [place];  Service: Orthopedics;  Laterality: Left;   TONSILLECTOMY  1994   Patient Active Problem List   Diagnosis Date Noted   Urgency of urination 09/12/2021   Hidradenitis suppurativa 02/24/2021   Cough 05/26/2020   Chronic pain syndrome 01/16/2020   Hx of AKA (above knee amputation), left (HCC)    Blurry vision, right eye 10/08/2019   Tobacco use 03/01/2019   Phantom limb pain ([place]) 11/08/2018    Current moderate episode of major depressive disorder (HCC)    Hyperkalemia    Anemia of chronic disease    Bipolar affective disorder ([place])    Heart failure with preserved ejection fraction ([place]) 02/09/2017   Healthcare maintenance 12/08/2016   End-stage renal disease on hemodialysis ([place]) 07/15/2016   OSA (obstructive sleep apnea) 05/13/2016   Morbid obesity due to excess calories ([place]) 11/27/2015   Subclinical hypothyroidism 04/25/2013   Type 2 diabetes mellitus ([place]) 07/12/2012   Carpal tunnel syndrome, bilateral 01/25/2012   Diabetic polyneuropathy associated with type 2 diabetes mellitus ([place]) 07/04/2011   Anxiety and depression 06/02/2011   GERD 12/06/2007   Hyperlipidemia 08/29/2007   Hypertension associated with diabetes ([place]) 08/29/2007    ONSET DATE: 02/03/2022   REFERRING DIAG: Y10.175 (ICD-10-CM) - Hx of AKA (above knee amputation), left (HCC)   THERAPY DIAG:  No diagnosis found.  Rationale for Evaluation and Treatment Rehabilitation  SUBJECTIVE:                                                                                                                                                                                              SUBJECTIVE STATEMENT: Patient reports doing well. Is having some soreness in her L stump due to attempts at tightening her bottom while walking. No falls/near falls.   Pt accompanied by: self  PERTINENT HISTORY:  ESRD, HTN, Chronic Pain, s/p left AKA     PAIN:  Are you having pain? Yes: NPRS scale: 1-2/10 Pain location: R knee  and low back Pain description: dull ache   PRECAUTIONS: Fall and Other: On dialysis T/Th/Sat. Fistula in L arm  PATIENT GOALS "I wanna work on walking and maybe strengthening my left knee"   OBJECTIVE:   TODAY'S TREATMENT:  Ther Ex  -scifit hills level 3 x10 mins B LE only for improved functional strength.  -stagger stance sit <> stand L LE biased 2x5 B UE + CGA -modified dead bug 2x12   -anti-rotation green theraband B 2x12 -230' [place] + close supervision   PATIENT EDUCATION: Education details: continue HEP Person educated: Patient Education method: Explanation, Demonstration, and Handouts Education comprehension: verbalized understanding   HOME EXERCISE PROGRAM: Access Code: [email or web address]    GOALS: Goals reviewed with patient? Yes  SHORT TERM GOALS: Target date: 03/18/2022  Pt will improve gait velocity to at least 2.74f/s with LRAD and S* for improved gait efficiency and endurance  Baseline: 1.92 ft/s w/[place] and CGA; 2.086fs or .6273mGoal status: IN PROGRESS  2.  Pt will improve 5 x STS to less than or equal to 23 seconds w/BUE support to demonstrate improved functional strength and transfer efficiency.   Baseline: 28.78s w/BUE support; 42.07s with B UE (increased time needed to manage L knee flexion prior to next stand)  Goal status: NOT MET  3.  2MWT to be performed and LTG written  Baseline:  Goal status: MET   LONG TERM GOALS: Target date: 04/15/2022  Pt will be independent with final HEP for improved strength, balance, transfers and gait.  Baseline:  Goal status: INITIAL  2.  Pt will improve 5 x STS to less than or equal to 20 seconds w/BUE support to demonstrate improved functional strength and transfer efficiency.   Baseline: 28.78s w/BUE support Goal status: INITIAL  3.  Pt will ambulate >/= 260' on 2MWT w/LRAD mod I for improved cardiovascular endurance and functional mobility  Baseline: 217' w/[place] and S*  Goal status: REVISED  4.  Pt will improve gait velocity to at least 2.5 ft/s w/LRAD mod I for improved gait efficiency   Baseline:  Goal status: INITIAL   ASSESSMENT:  CLINICAL IMPRESSION: Patient seen for skilled PT session with emphasis on functional strengthening. Patient with increased difficulty standing with L LE biased, but this is improving. Would benefit from further core strengthening. Continue POC.     OBJECTIVE IMPAIRMENTS Abnormal gait, decreased activity tolerance, decreased endurance, decreased knowledge of use of DME, decreased mobility, difficulty walking, decreased strength, increased edema, prosthetic dependency , obesity, and pain.   ACTIVITY LIMITATIONS carrying, lifting, squatting, stairs, transfers, locomotion level, and caring for others  PARTICIPATION LIMITATIONS: meal prep, cleaning, laundry, driving, shopping, community activity, occupation, and yard work  PERSONAL FACEducation officer, communityast/current experiences, Transportation, and 1-2 comorbidities: dialysis on T/Th/Sat and chronic pain of R knee  are also affecting patient's functional outcome.   REHAB POTENTIAL: Good  CLINICAL DECISION MAKING: Evolving/moderate complexity  EVALUATION COMPLEXITY: Moderate  PLAN: PT FREQUENCY: 2x/week  PT DURATION: 8 weeks  PLANNED INTERVENTIONS: Therapeutic exercises, Therapeutic activity, Neuromuscular re-education, Balance training, Gait training, Patient/Family education, Self Care, Joint mobilization, Stair training, Vestibular training, Canalith repositioning, Prosthetic training, DME instructions, Dry Needling, Cryotherapy, Moist heat, Compression bandaging, Taping, Manual therapy, and Re-evaluation  PLAN FOR NEXT SESSION:  continue gait training w/emphasis on L knee flexion w/toe off and midline orientation, sit <> stand with no UE (needed to kidney transplant)    JenDebbora DusT, DPT JenDebbora DusT, DPT, CBIS  04/01/2022, 11:41 AM

## 2022-04-01 NOTE — Telephone Encounter (Signed)
No-show for today's session; attempted to call her, someone answered the phone but the connection was very poor and we were unable to hear each other. Will try to reach out via MyChart message if she has an active account.  Ann Lions PT DPT PN2

## 2022-04-02 DIAGNOSIS — D689 Coagulation defect, unspecified: Secondary | ICD-10-CM | POA: Diagnosis not present

## 2022-04-02 DIAGNOSIS — N186 End stage renal disease: Secondary | ICD-10-CM | POA: Diagnosis not present

## 2022-04-02 DIAGNOSIS — L299 Pruritus, unspecified: Secondary | ICD-10-CM | POA: Diagnosis not present

## 2022-04-02 DIAGNOSIS — Z992 Dependence on renal dialysis: Secondary | ICD-10-CM | POA: Diagnosis not present

## 2022-04-02 DIAGNOSIS — D509 Iron deficiency anemia, unspecified: Secondary | ICD-10-CM | POA: Diagnosis not present

## 2022-04-02 DIAGNOSIS — D631 Anemia in chronic kidney disease: Secondary | ICD-10-CM | POA: Diagnosis not present

## 2022-04-02 DIAGNOSIS — N2581 Secondary hyperparathyroidism of renal origin: Secondary | ICD-10-CM | POA: Diagnosis not present

## 2022-04-04 ENCOUNTER — Other Ambulatory Visit: Payer: Self-pay | Admitting: Student

## 2022-04-05 DIAGNOSIS — L299 Pruritus, unspecified: Secondary | ICD-10-CM | POA: Diagnosis not present

## 2022-04-05 DIAGNOSIS — D631 Anemia in chronic kidney disease: Secondary | ICD-10-CM | POA: Diagnosis not present

## 2022-04-05 DIAGNOSIS — Z992 Dependence on renal dialysis: Secondary | ICD-10-CM | POA: Diagnosis not present

## 2022-04-05 DIAGNOSIS — N186 End stage renal disease: Secondary | ICD-10-CM | POA: Diagnosis not present

## 2022-04-05 DIAGNOSIS — D509 Iron deficiency anemia, unspecified: Secondary | ICD-10-CM | POA: Diagnosis not present

## 2022-04-05 DIAGNOSIS — D689 Coagulation defect, unspecified: Secondary | ICD-10-CM | POA: Diagnosis not present

## 2022-04-05 DIAGNOSIS — N2581 Secondary hyperparathyroidism of renal origin: Secondary | ICD-10-CM | POA: Diagnosis not present

## 2022-04-06 ENCOUNTER — Ambulatory Visit: Payer: Medicare Other | Admitting: Physical Therapy

## 2022-04-07 ENCOUNTER — Other Ambulatory Visit: Payer: Self-pay | Admitting: Student

## 2022-04-07 DIAGNOSIS — Z992 Dependence on renal dialysis: Secondary | ICD-10-CM | POA: Diagnosis not present

## 2022-04-07 DIAGNOSIS — F32A Anxiety disorder, unspecified: Secondary | ICD-10-CM

## 2022-04-07 DIAGNOSIS — L299 Pruritus, unspecified: Secondary | ICD-10-CM | POA: Diagnosis not present

## 2022-04-07 DIAGNOSIS — N2581 Secondary hyperparathyroidism of renal origin: Secondary | ICD-10-CM | POA: Diagnosis not present

## 2022-04-07 DIAGNOSIS — D509 Iron deficiency anemia, unspecified: Secondary | ICD-10-CM | POA: Diagnosis not present

## 2022-04-07 DIAGNOSIS — D631 Anemia in chronic kidney disease: Secondary | ICD-10-CM | POA: Diagnosis not present

## 2022-04-07 DIAGNOSIS — N186 End stage renal disease: Secondary | ICD-10-CM | POA: Diagnosis not present

## 2022-04-07 DIAGNOSIS — D689 Coagulation defect, unspecified: Secondary | ICD-10-CM | POA: Diagnosis not present

## 2022-04-07 NOTE — Telephone Encounter (Signed)
Last appointment TeleHealth 03/04/2022.  No [facility]

## 2022-04-08 ENCOUNTER — Ambulatory Visit: Payer: Medicare Other | Admitting: Physical Therapy

## 2022-04-08 DIAGNOSIS — M6281 Muscle weakness (generalized): Secondary | ICD-10-CM | POA: Diagnosis not present

## 2022-04-08 DIAGNOSIS — R2689 Other abnormalities of gait and mobility: Secondary | ICD-10-CM | POA: Diagnosis not present

## 2022-04-08 DIAGNOSIS — R2681 Unsteadiness on feet: Secondary | ICD-10-CM

## 2022-04-08 DIAGNOSIS — R296 Repeated falls: Secondary | ICD-10-CM | POA: Diagnosis not present

## 2022-04-08 DIAGNOSIS — R293 Abnormal posture: Secondary | ICD-10-CM | POA: Diagnosis not present

## 2022-04-08 NOTE — Therapy (Signed)
OUTPATIENT PHYSICAL THERAPY NEURO TREATMENT   Patient Name: [name] [name] MRN: [id number] DOB:September 01, 1975, 46 y.o., female Today's Date: 04/08/2022   PCP: Riesa Pope, MD REFERRING PROVIDER: Aldine Contes, MD    PT End of Session - 04/08/22 1021     Visit Number 9    Number of Visits 17    Date for PT Re-Evaluation 04/22/22    Authorization Type UHC Medicare/Medicaid of [place]    PT Start Time 1018    PT Stop Time 1101    PT Time Calculation (min) 43 min    Equipment Utilized During Treatment Gait belt    Activity Tolerance Patient tolerated treatment well    Behavior During Therapy WFL for tasks assessed/performed              Past Medical History:  Diagnosis Date   Abdominal muscle pain 09/08/2016   Abnormal Pap smear of cervix 2009   Abscess    history of multiple abscesses   Acute bilateral low back pain 02/13/2017   Acute blood loss anemia    Acute on chronic renal failure ([place]) 07/12/2012   Acute renal failure ([place]) 07/12/2012   Acute respiratory failure with hypoxia ([place]) 10/21/2019   Adjustment disorder with depressed mood 03/26/2017   AKI (acute kidney injury) ([place])    Anemia 10/21/2019   Anemia of chronic disease 2002   Anginal pain (HCC)    Anxiety    Panic attacks   Bilateral lower extremity edema 05/13/2016   Bipolar disorder ([place])    Cataract    B/L cataract   Cellulitis 05/21/2014   right eye   CHF (congestive heart failure) ([place])    Chronic bronchitis ([place])    "get it q yr" (05/13/2013)   Chronic pain    Chronic pain of right knee 09/08/2016   Dehiscence of amputation stump ([place])    Depression    Edema of lower extremity    Endocarditis 2002   subacute bacterial endocarditis.    Fall    Family history of anesthesia complication    "my mom has a hard time coming out from under"   Fibromyalgia    GERD (gastroesophageal reflux disease)    occ   Heart murmur    Herpes simplex type 1 infection 01/16/2018   History of  blood transfusion    "just low blood count" (05/13/2013)   Hyperlipidemia    Hypertension    Hypothyroidism    Hypothyroidism, adult 03/21/2014   Hypoxia    Leukocytosis    Muscle spasm 12/16/2020   Necrosis ([place])    and ulceration   Necrotizing fasciitis s/p OR debridements 07/06/2012   Obesity    OSA on CPAP    does not wear all the time   Peripheral neuropathy    Pneumonia    Pyelonephritis 12/31/2020   Routine adult health maintenance 12/08/2016   Severe protein-calorie malnutrition ([place])    Status post below knee amputation, left ([place]) 04/11/2017   Type II diabetes mellitus ([place])    Type  II   Vaginal irritation 03/14/2016   Weak pulse 07/21/2021   Wound dehiscence 09/04/2019   Past Surgical History:  Procedure Laterality Date   A/V FISTULAGRAM Left 05/12/2021   Procedure: A/V FISTULAGRAM;  Surgeon: Broadus John, MD;  Location: [facility];  Service: Cardiovascular;  Laterality: Left;   ABDOMINAL AORTOGRAM W/LOWER EXTREMITY Left 03/19/2019   Procedure: ABDOMINAL AORTOGRAM W/LOWER EXTREMITY;  Surgeon: Serafina Mitchell, MD;  Location: [facility];  Service: Cardiovascular;  Laterality: Left;   ABOVE KNEE LEG AMPUTATION Left 10/11/2019   wound dehisence    AIR/FLUID EXCHANGE Right 12/31/2019   Procedure: AIR/FLUID EXCHANGE;  Surgeon: Jalene Mullet, MD;  Location: [facility];  Service: Ophthalmology;  Laterality: Right;   AMPUTATION Left 04/07/2017   Procedure: LEFT BELOW KNEE AMPUTATION;  Surgeon: Newt Minion, MD;  Location: [facility];  Service: Orthopedics;  Laterality: Left;   AMPUTATION Left 10/11/2019   Procedure: LEFT ABOVE KNEE AMPUTATION;  Surgeon: Newt Minion, MD;  Location: [facility];  Service: Orthopedics;  Laterality: Left;   AV FISTULA PLACEMENT Left 01/05/2021   Procedure: ARTERIOVENOUS (AV) FISTULA CREATION vs. GRAFT;  Surgeon: Angelia Mould, MD;  Location: [facility];  Service: Vascular;  Laterality: Left;   CATARACT EXTRACTION W/PHACO Right  02/11/2020   Procedure: CATARACT EXTRACTION PHACO AND INTRAOCULAR LENS PLACEMENT ([place]);  Surgeon: Jalene Mullet, MD;  Location: [place];  Service: Ophthalmology;  Laterality: Right;   EYE SURGERY     lazer   FISTULA SUPERFICIALIZATION Left 02/23/2021   Procedure: SUPERFICIALIZATION OF LEFT BRACHIOCEPHALIC FISTULA;  Surgeon: Angelia Mould, MD;  Location: [place];  Service: Vascular;  Laterality: Left;   GAS INSERTION Right 12/31/2019   Procedure: INSERTION OF GAS;  Surgeon: Jalene Mullet, MD;  Location: [facility];  Service: Ophthalmology;  Laterality: Right;   INCISION AND DRAINAGE ABSCESS     multiple I&Ds   INCISION AND DRAINAGE ABSCESS Left 07/09/2012   Procedure: DRESSING CHANGE, THIGH WOUND;  Surgeon: Harl Bowie, MD;  Location: [facility];  Service: General;  Laterality: Left;   INCISION AND DRAINAGE OF WOUND Left 07/07/2012   Procedure: IRRIGATION AND DEBRIDEMENT WOUND;  Surgeon: Harl Bowie, MD;  Location: [place] [facility];  Service: General;  Laterality: Left;   INCISION AND DRAINAGE PERIRECTAL ABSCESS Left 07/14/2012   Procedure: DEBRIDEMENT OF SKIN & SOFT TISSUE; DRESSING CHANGE UNDER ANESTHESIA;  Surgeon: Gayland Curry, MD,FACS;  Location: [place];  Service: General;  Laterality: Left;   INCISION AND DRAINAGE PERIRECTAL ABSCESS Left 07/16/2012   Procedure: I&D Left Thigh;  Surgeon: Gwenyth Ober, MD;  Location: [facility];  Service: General;  Laterality: Left;   INCISION AND DRAINAGE PERIRECTAL ABSCESS N/A 01/05/2015   Procedure: IRRIGATION AND DEBRIDEMENT PERIRECTAL ABSCESS;  Surgeon: Donnie Mesa, MD;  Location: [place];  Service: General;  Laterality: N/A;   INSERTION OF DIALYSIS CATHETER Right 01/05/2021   Procedure: INSERTION OF DIALYSIS CATHETER;  Surgeon: Angelia Mould, MD;  Location: [facility];  Service: Vascular;  Laterality: Right;   IRRIGATION AND DEBRIDEMENT ABSCESS Left 07/06/2012   Procedure: IRRIGATION AND DEBRIDEMENT ABSCESS BUTTOCKS AND THIGH;  Surgeon: Shann Medal, MD;  Location: [place];  Service: General;  Laterality: Left;   IRRIGATION AND DEBRIDEMENT ABSCESS Left 08/10/2012   Procedure: IRRIGATION AND DEBRIDEMENT ABSCESS;  Surgeon: Madilyn Hook, DO;  Location: [place] [facility];  Service: General;  Laterality: Left;   PARS PLANA VITRECTOMY Right 12/31/2019   Procedure: PARS PLANA VITRECTOMY WITH 25 GAUGE;  Surgeon: Jalene Mullet, MD;  Location: [place];  Service: Ophthalmology;  Laterality: Right;   PERIPHERAL VASCULAR INTERVENTION Left 05/12/2021   Procedure: PERIPHERAL VASCULAR INTERVENTION;  Surgeon: Broadus John, MD;  Location: [facility];  Service: Cardiovascular;  Laterality: Left;  arm fistula   PHOTOCOAGULATION WITH LASER Right 12/31/2019   Procedure: PHOTOCOAGULATION WITH LASER;  Surgeon: Jalene Mullet, MD;  Location: [facility];  Service: Ophthalmology;  Laterality: Right;   REPAIR  OF COMPLEX TRACTION RETINAL DETACHMENT Right 12/31/2019   Procedure: REPAIR OF COMPLEX TRACTION RETINAL;  Surgeon: Jalene Mullet, MD;  Location: [facility];  Service: Ophthalmology;  Laterality: Right;   STUMP REVISION Left 06/09/2017   Procedure: REVISION LEFT BELOW KNEE AMPUTATION;  Surgeon: Newt Minion, MD;  Location: [facility];  Service: Orthopedics;  Laterality: Left;   STUMP REVISION Left 09/04/2019   Procedure: LEFT BELOW KNEE AMPUTATION REVISION;  Surgeon: Newt Minion, MD;  Location: [place];  Service: Orthopedics;  Laterality: Left;   STUMP REVISION Left 09/20/2019   Procedure: LEFT BELOW KNEE AMPUTATION REVISION;  Surgeon: Newt Minion, MD;  Location: [place] [facility];  Service: Orthopedics;  Laterality: Left;   TONSILLECTOMY  1994   Patient Active Problem List   Diagnosis Date Noted   Urgency of urination 09/12/2021   Hidradenitis suppurativa 02/24/2021   Cough 05/26/2020   Chronic pain syndrome 01/16/2020   Hx of AKA (above knee amputation), left (HCC)    Blurry vision, right eye 10/08/2019   Tobacco use 03/01/2019   Phantom limb pain ([place]) 11/08/2018    Current moderate episode of major depressive disorder (HCC)    Hyperkalemia    Anemia of chronic disease    Bipolar affective disorder ([place])    Heart failure with preserved ejection fraction ([place]) 02/09/2017   Healthcare maintenance 12/08/2016   End-stage renal disease on hemodialysis ([place]) 07/15/2016   OSA (obstructive sleep apnea) 05/13/2016   Morbid obesity due to excess calories ([place]) 11/27/2015   Subclinical hypothyroidism 04/25/2013   Type 2 diabetes mellitus ([place]) 07/12/2012   Carpal tunnel syndrome, bilateral 01/25/2012   Diabetic polyneuropathy associated with type 2 diabetes mellitus ([place]) 07/04/2011   Anxiety and depression 06/02/2011   GERD 12/06/2007   Hyperlipidemia 08/29/2007   Hypertension associated with diabetes ([place]) 08/29/2007    ONSET DATE: 02/03/2022   REFERRING DIAG: J03.009 (ICD-10-CM) - Hx of AKA (above knee amputation), left (HCC)   THERAPY DIAG:  Unsteadiness on feet  Other abnormalities of gait and mobility  Muscle weakness (generalized)  Rationale for Evaluation and Treatment Rehabilitation  SUBJECTIVE:                                                                                                                                                                                              SUBJECTIVE STATEMENT: Pt reports her stump is still sore from it "itchin so much". Pt reports she is having a lot of itchiness "where my knee would be". No falls.   Pt accompanied by: self  PERTINENT HISTORY:  ESRD, HTN, Chronic Pain, s/p left AKA  PAIN:  Are you having pain? Yes: NPRS scale: 1-2/10 Pain location: R knee and low back Pain description: dull ache   PRECAUTIONS: Fall and Other: On dialysis T/Th/Sat. Fistula in L arm  PATIENT GOALS "I wanna work on walking and maybe strengthening my left knee"   OBJECTIVE:   TODAY'S TREATMENT:  Ther Act  Unable to doff prosthetic to assess skin, but pt showed therapist her elbow and stated her stump  looks similar. Her elbow is very discolored, rough and patchy. Encouraged pt to contact PCP as on her last visit, he provided pt w/steroid cream which has not been working.  Ther Ex  SciFit multi-peaks level 6 for 8 minutes using BLEs only for dynamic cardiovascular warmup and BLE strength.   NMR  Sit <>stands from mat w/2" airex under without UE support for improved BLE strength, anterior weight shift and proper foot placement. Placed chair in front of pt for safety and cue to reach towards. Pt able to perform 10 reps slowly, noted pt initiating staggered stance (RLE posterior to LLE), so provided min cues to maintain proper foot position to facilitate weight shift to LLE and pt able to demonstrate well.  Progressed to sit <>stands without UE support from standard mat height and pt able to perform 3 reps w/CGA-min A prior to fatiguing. Noted very wide BOS and difficulty maintaining anterior weight shift, falling backward onto mat several times. Pt reported increase in R knee pain w/activity, up to 7/10, that dissipated with rest. Encouraged pt to work on this at home safely (similar set up as in clinic) and pt verbalized understanding.    PATIENT EDUCATION: Education details: continue HEP Person educated: Patient Education method: Explanation, Media planner, and Handouts Education comprehension: verbalized understanding   HOME EXERCISE PROGRAM: Access Code: [email or web address]    GOALS: Goals reviewed with patient? Yes  SHORT TERM GOALS: Target date: 03/18/2022  Pt will improve gait velocity to at least 2.53f/s with LRAD and S* for improved gait efficiency and endurance  Baseline: 1.92 ft/s w/[place] and CGA; 2.061fs or .6282mGoal status: IN PROGRESS  2.  Pt will improve 5 x STS to less than or equal to 23 seconds w/BUE support to demonstrate improved functional strength and transfer efficiency.   Baseline: 28.78s w/BUE support; 42.07s with B UE (increased time needed to manage L knee  flexion prior to next stand)  Goal status: NOT MET  3.  2MWT to be performed and LTG written  Baseline:  Goal status: MET   LONG TERM GOALS: Target date: 04/15/2022  Pt will be independent with final HEP for improved strength, balance, transfers and gait.  Baseline:  Goal status: INITIAL  2.  Pt will improve 5 x STS to less than or equal to 20 seconds w/BUE support to demonstrate improved functional strength and transfer efficiency.   Baseline: 28.78s w/BUE support Goal status: INITIAL  3.  Pt will ambulate >/= 260' on 2MWT w/LRAD mod I for improved cardiovascular endurance and functional mobility  Baseline: 217' w/[place] and S*  Goal status: REVISED  4.  Pt will improve gait velocity to at least 2.5 ft/s w/LRAD mod I for improved gait efficiency   Baseline:  Goal status: INITIAL   ASSESSMENT:  CLINICAL IMPRESSION: Emphasis of skilled PT session on global strengthening and transfers. Pt limited by soreness of her stump due to skin irritation that she scratches frequently. Encouraged pt to contact PCP about this. Pt able to perform sit <>stands without UE support from elevated height  but continues to favor her R side despite cues and reports increase in R knee pain with activity. Continue POC.    OBJECTIVE IMPAIRMENTS Abnormal gait, decreased activity tolerance, decreased endurance, decreased knowledge of use of DME, decreased mobility, difficulty walking, decreased strength, increased edema, prosthetic dependency , obesity, and pain.   ACTIVITY LIMITATIONS carrying, lifting, squatting, stairs, transfers, locomotion level, and caring for others  PARTICIPATION LIMITATIONS: meal prep, cleaning, laundry, driving, shopping, community activity, occupation, and yard work  PERSONAL Education officer, community, Past/current experiences, Transportation, and 1-2 comorbidities: dialysis on T/Th/Sat and chronic pain of R knee  are also affecting patient's functional outcome.   REHAB POTENTIAL:  Good  CLINICAL DECISION MAKING: Evolving/moderate complexity  EVALUATION COMPLEXITY: Moderate  PLAN: PT FREQUENCY: 2x/week  PT DURATION: 8 weeks  PLANNED INTERVENTIONS: Therapeutic exercises, Therapeutic activity, Neuromuscular re-education, Balance training, Gait training, Patient/Family education, Self Care, Joint mobilization, Stair training, Vestibular training, Canalith repositioning, Prosthetic training, DME instructions, Dry Needling, Cryotherapy, Moist heat, Compression bandaging, Taping, Manual therapy, and Re-evaluation  PLAN FOR NEXT SESSION:  continue gait training w/emphasis on L knee flexion w/toe off and midline orientation, sit <> stand with no UE (needed to kidney transplant)    [name] [name], PT, DPT 04/08/2022, 11:04 AM

## 2022-04-09 DIAGNOSIS — L299 Pruritus, unspecified: Secondary | ICD-10-CM | POA: Diagnosis not present

## 2022-04-09 DIAGNOSIS — N186 End stage renal disease: Secondary | ICD-10-CM | POA: Diagnosis not present

## 2022-04-09 DIAGNOSIS — D631 Anemia in chronic kidney disease: Secondary | ICD-10-CM | POA: Diagnosis not present

## 2022-04-09 DIAGNOSIS — N2581 Secondary hyperparathyroidism of renal origin: Secondary | ICD-10-CM | POA: Diagnosis not present

## 2022-04-09 DIAGNOSIS — Z992 Dependence on renal dialysis: Secondary | ICD-10-CM | POA: Diagnosis not present

## 2022-04-09 DIAGNOSIS — D689 Coagulation defect, unspecified: Secondary | ICD-10-CM | POA: Diagnosis not present

## 2022-04-09 DIAGNOSIS — D509 Iron deficiency anemia, unspecified: Secondary | ICD-10-CM | POA: Diagnosis not present

## 2022-04-11 DIAGNOSIS — D509 Iron deficiency anemia, unspecified: Secondary | ICD-10-CM | POA: Diagnosis not present

## 2022-04-11 DIAGNOSIS — D689 Coagulation defect, unspecified: Secondary | ICD-10-CM | POA: Diagnosis not present

## 2022-04-11 DIAGNOSIS — L299 Pruritus, unspecified: Secondary | ICD-10-CM | POA: Diagnosis not present

## 2022-04-11 DIAGNOSIS — N2581 Secondary hyperparathyroidism of renal origin: Secondary | ICD-10-CM | POA: Diagnosis not present

## 2022-04-11 DIAGNOSIS — N186 End stage renal disease: Secondary | ICD-10-CM | POA: Diagnosis not present

## 2022-04-11 DIAGNOSIS — Z992 Dependence on renal dialysis: Secondary | ICD-10-CM | POA: Diagnosis not present

## 2022-04-11 DIAGNOSIS — D631 Anemia in chronic kidney disease: Secondary | ICD-10-CM | POA: Diagnosis not present

## 2022-04-13 DIAGNOSIS — D509 Iron deficiency anemia, unspecified: Secondary | ICD-10-CM | POA: Diagnosis not present

## 2022-04-13 DIAGNOSIS — N2581 Secondary hyperparathyroidism of renal origin: Secondary | ICD-10-CM | POA: Diagnosis not present

## 2022-04-13 DIAGNOSIS — Z992 Dependence on renal dialysis: Secondary | ICD-10-CM | POA: Diagnosis not present

## 2022-04-13 DIAGNOSIS — D689 Coagulation defect, unspecified: Secondary | ICD-10-CM | POA: Diagnosis not present

## 2022-04-13 DIAGNOSIS — N186 End stage renal disease: Secondary | ICD-10-CM | POA: Diagnosis not present

## 2022-04-13 DIAGNOSIS — D631 Anemia in chronic kidney disease: Secondary | ICD-10-CM | POA: Diagnosis not present

## 2022-04-13 DIAGNOSIS — L299 Pruritus, unspecified: Secondary | ICD-10-CM | POA: Diagnosis not present

## 2022-04-16 DIAGNOSIS — Z992 Dependence on renal dialysis: Secondary | ICD-10-CM | POA: Diagnosis not present

## 2022-04-16 DIAGNOSIS — D631 Anemia in chronic kidney disease: Secondary | ICD-10-CM | POA: Diagnosis not present

## 2022-04-16 DIAGNOSIS — N2581 Secondary hyperparathyroidism of renal origin: Secondary | ICD-10-CM | POA: Diagnosis not present

## 2022-04-16 DIAGNOSIS — D689 Coagulation defect, unspecified: Secondary | ICD-10-CM | POA: Diagnosis not present

## 2022-04-16 DIAGNOSIS — D509 Iron deficiency anemia, unspecified: Secondary | ICD-10-CM | POA: Diagnosis not present

## 2022-04-16 DIAGNOSIS — L299 Pruritus, unspecified: Secondary | ICD-10-CM | POA: Diagnosis not present

## 2022-04-16 DIAGNOSIS — N186 End stage renal disease: Secondary | ICD-10-CM | POA: Diagnosis not present

## 2022-04-18 ENCOUNTER — Ambulatory Visit: Payer: Medicare Other | Admitting: Physical Therapy

## 2022-04-19 DIAGNOSIS — D509 Iron deficiency anemia, unspecified: Secondary | ICD-10-CM | POA: Diagnosis not present

## 2022-04-19 DIAGNOSIS — Z992 Dependence on renal dialysis: Secondary | ICD-10-CM | POA: Diagnosis not present

## 2022-04-19 DIAGNOSIS — D689 Coagulation defect, unspecified: Secondary | ICD-10-CM | POA: Diagnosis not present

## 2022-04-19 DIAGNOSIS — D631 Anemia in chronic kidney disease: Secondary | ICD-10-CM | POA: Diagnosis not present

## 2022-04-19 DIAGNOSIS — L299 Pruritus, unspecified: Secondary | ICD-10-CM | POA: Diagnosis not present

## 2022-04-19 DIAGNOSIS — N186 End stage renal disease: Secondary | ICD-10-CM | POA: Diagnosis not present

## 2022-04-19 DIAGNOSIS — N2581 Secondary hyperparathyroidism of renal origin: Secondary | ICD-10-CM | POA: Diagnosis not present

## 2022-04-20 ENCOUNTER — Ambulatory Visit: Payer: Medicare Other | Admitting: Physical Therapy

## 2022-04-20 DIAGNOSIS — M6281 Muscle weakness (generalized): Secondary | ICD-10-CM

## 2022-04-20 DIAGNOSIS — R2681 Unsteadiness on feet: Secondary | ICD-10-CM | POA: Diagnosis not present

## 2022-04-20 DIAGNOSIS — R2689 Other abnormalities of gait and mobility: Secondary | ICD-10-CM

## 2022-04-20 DIAGNOSIS — R293 Abnormal posture: Secondary | ICD-10-CM | POA: Diagnosis not present

## 2022-04-20 DIAGNOSIS — R296 Repeated falls: Secondary | ICD-10-CM | POA: Diagnosis not present

## 2022-04-20 NOTE — Therapy (Signed)
OUTPATIENT PHYSICAL THERAPY NEURO TREATMENT- 10TH VISIT PROGRESS NOTE AND RECERTIFICATION   Patient Name: [name] [name] MRN: [id number] DOB:November 07, 1975, 46 y.o., female Today's Date: 04/20/2022   PCP: Riesa Pope, MD REFERRING PROVIDER: Aldine Contes, MD   Physical Therapy Progress Note   Dates of Reporting Period:02/18/22 - 04/20/22  See Note below for Objective Data and Assessment of Progress/Goals.  Thank you for the referral of this patient. Mickie Bail Zakhari Fogel, PT, DPT    PT End of Session - 04/20/22 0932     Visit Number 10    Number of Visits 25   Recert   Date for PT Re-Evaluation 70/62/37   Recert   Authorization Type UHC Medicare/Medicaid of [place]    PT Start Time 0930    PT Stop Time 1013    PT Time Calculation (min) 43 min    Equipment Utilized During Treatment Gait belt    Activity Tolerance Patient tolerated treatment well    Behavior During Therapy WFL for tasks assessed/performed               Past Medical History:  Diagnosis Date   Abdominal muscle pain 09/08/2016   Abnormal Pap smear of cervix 2009   Abscess    history of multiple abscesses   Acute bilateral low back pain 02/13/2017   Acute blood loss anemia    Acute on chronic renal failure ([place]) 07/12/2012   Acute renal failure ([place]) 07/12/2012   Acute respiratory failure with hypoxia ([place]) 10/21/2019   Adjustment disorder with depressed mood 03/26/2017   AKI (acute kidney injury) ([place])    Anemia 10/21/2019   Anemia of chronic disease 2002   Anginal pain (HCC)    Anxiety    Panic attacks   Bilateral lower extremity edema 05/13/2016   Bipolar disorder ([place])    Cataract    B/L cataract   Cellulitis 05/21/2014   right eye   CHF (congestive heart failure) ([place])    Chronic bronchitis ([place])    "get it q yr" (05/13/2013)   Chronic pain    Chronic pain of right knee 09/08/2016   Dehiscence of amputation stump ([place])    Depression    Edema of lower extremity    Endocarditis  2002   subacute bacterial endocarditis.    Fall    Family history of anesthesia complication    "my mom has a hard time coming out from under"   Fibromyalgia    GERD (gastroesophageal reflux disease)    occ   Heart murmur    Herpes simplex type 1 infection 01/16/2018   History of blood transfusion    "just low blood count" (05/13/2013)   Hyperlipidemia    Hypertension    Hypothyroidism    Hypothyroidism, adult 03/21/2014   Hypoxia    Leukocytosis    Muscle spasm 12/16/2020   Necrosis ([place])    and ulceration   Necrotizing fasciitis s/p OR debridements 07/06/2012   Obesity    OSA on CPAP    does not wear all the time   Peripheral neuropathy    Pneumonia    Pyelonephritis 12/31/2020   Routine adult health maintenance 12/08/2016   Severe protein-calorie malnutrition ([place])    Status post below knee amputation, left ([place]) 04/11/2017   Type II diabetes mellitus (HCC)    Type  II   Vaginal irritation 03/14/2016   Weak pulse 07/21/2021   Wound dehiscence 09/04/2019   Past Surgical History:  Procedure Laterality Date   A/V FISTULAGRAM Left 05/12/2021  Procedure: A/V FISTULAGRAM;  Surgeon: Robins, Joshua E, MD;  [facility];  Service: Cardiovascular;  Laterality: Left;   ABDOMINAL AORTOGRAM W/LOWER EXTREMITY Left 03/19/2019   Procedure: ABDOMINAL AORTOGRAM W/LOWER EXTREMITY;  Surgeon: Brabham, Vance W, MD;  [facility];  Service: Cardiovascular;  Laterality: Left;   ABOVE KNEE LEG AMPUTATION Left 10/11/2019   wound dehisence    AIR/FLUID EXCHANGE Right 12/31/2019   Procedure: AIR/FLUID EXCHANGE;  Surgeon: Patel, Narendra, MD;  [facility];  Service: Ophthalmology;  Laterality: Right;   AMPUTATION Left 04/07/2017   Procedure: LEFT BELOW KNEE AMPUTATION;  Surgeon: Duda, Marcus V, MD;  [facility];  Service: Orthopedics;  Laterality: Left;   AMPUTATION Left 10/11/2019   Procedure: LEFT ABOVE KNEE AMPUTATION;  Surgeon: Duda, Marcus V, MD;   [facility];  Service: Orthopedics;  Laterality: Left;   AV FISTULA PLACEMENT Left 01/05/2021   Procedure: ARTERIOVENOUS (AV) FISTULA CREATION vs. GRAFT;  Surgeon: Dickson, Christopher S, MD;  [facility];  Service: Vascular;  Laterality: Left;   CATARACT EXTRACTION W/PHACO Right 02/11/2020   Procedure: CATARACT EXTRACTION PHACO AND INTRAOCULAR LENS PLACEMENT (IOC);  Surgeon: Patel, Narendra, MD;  [facility];  Service: Ophthalmology;  Laterality: Right;   EYE SURGERY     lazer   FISTULA SUPERFICIALIZATION Left 02/23/2021   Procedure: SUPERFICIALIZATION OF LEFT BRACHIOCEPHALIC FISTULA;  Surgeon: Dickson, Christopher S, MD;  [facility];  Service: Vascular;  Laterality: Left;   GAS INSERTION Right 12/31/2019   Procedure: INSERTION OF GAS;  Surgeon: Patel, Narendra, MD;  [facility];  Service: Ophthalmology;  Laterality: Right;   INCISION AND DRAINAGE ABSCESS     multiple I&Ds   INCISION AND DRAINAGE ABSCESS Left 07/09/2012   Procedure: DRESSING CHANGE, THIGH WOUND;  Surgeon: Douglas A Blackman, MD;  [facility];  Service: General;  Laterality: Left;   INCISION AND DRAINAGE OF WOUND Left 07/07/2012   Procedure: IRRIGATION AND DEBRIDEMENT WOUND;  Surgeon: Douglas A Blackman, MD;  [facility];  Service: General;  Laterality: Left;   INCISION AND DRAINAGE PERIRECTAL ABSCESS Left 07/14/2012   Procedure: DEBRIDEMENT OF SKIN & SOFT TISSUE; DRESSING CHANGE UNDER ANESTHESIA;  Surgeon: Eric M Wilson, MD,FACS;  [facility];  Service: General;  Laterality: Left;   INCISION AND DRAINAGE PERIRECTAL ABSCESS Left 07/16/2012   Procedure: I&D Left Thigh;  Surgeon: James O Wyatt, MD;  [facility];  Service: General;  Laterality: Left;   INCISION AND DRAINAGE PERIRECTAL ABSCESS N/A 01/05/2015   Procedure: IRRIGATION AND DEBRIDEMENT PERIRECTAL ABSCESS;  Surgeon: Matthew Tsuei, MD;  [facility];  Service: General;  Laterality: N/A;   INSERTION OF DIALYSIS CATHETER Right  01/05/2021   Procedure: INSERTION OF DIALYSIS CATHETER;  Surgeon: Dickson, Christopher S, MD;  [facility];  Service: Vascular;  Laterality: Right;   IRRIGATION AND DEBRIDEMENT ABSCESS Left 07/06/2012   Procedure: IRRIGATION AND DEBRIDEMENT ABSCESS BUTTOCKS AND THIGH;  Surgeon: David H Newman, MD;  [facility];  Service: General;  Laterality: Left;   IRRIGATION AND DEBRIDEMENT ABSCESS Left 08/10/2012   Procedure: IRRIGATION AND DEBRIDEMENT ABSCESS;  Surgeon: Brian Layton, DO;  [facility];  Service: General;  Laterality: Left;   PARS PLANA VITRECTOMY Right 12/31/2019   Procedure: PARS PLANA VITRECTOMY WITH 25 GAUGE;  Surgeon: Patel, Narendra, MD;  [facility];  Service: Ophthalmology;  Laterality: Right;   PERIPHERAL VASCULAR INTERVENTION Left 05/12/2021   Procedure: PERIPHERAL VASCULAR INTERVENTION;  Surgeon: Robins,   Carolann Littler, MD;  Location: [facility];  Service: Cardiovascular;  Laterality: Left;  arm fistula   PHOTOCOAGULATION WITH LASER Right 12/31/2019   Procedure: PHOTOCOAGULATION WITH LASER;  Surgeon: Jalene Mullet, MD;  Location: [facility];  Service: Ophthalmology;  Laterality: Right;   REPAIR OF COMPLEX TRACTION RETINAL DETACHMENT Right 12/31/2019   Procedure: REPAIR OF COMPLEX TRACTION RETINAL;  Surgeon: Jalene Mullet, MD;  Location: [facility];  Service: Ophthalmology;  Laterality: Right;   STUMP REVISION Left 06/09/2017   Procedure: REVISION LEFT BELOW KNEE AMPUTATION;  Surgeon: Newt Minion, MD;  Location: [facility];  Service: Orthopedics;  Laterality: Left;   STUMP REVISION Left 09/04/2019   Procedure: LEFT BELOW KNEE AMPUTATION REVISION;  Surgeon: Newt Minion, MD;  Location: [facility];  Service: Orthopedics;  Laterality: Left;   STUMP REVISION Left 09/20/2019   Procedure: LEFT BELOW KNEE AMPUTATION REVISION;  Surgeon: Newt Minion, MD;  Location: [facility];  Service: Orthopedics;  Laterality: Left;   TONSILLECTOMY  1994   Patient Active Problem List   Diagnosis  Date Noted   Urgency of urination 09/12/2021   Hidradenitis suppurativa 02/24/2021   Cough 05/26/2020   Chronic pain syndrome 01/16/2020   Hx of AKA (above knee amputation), left (HCC)    Blurry vision, right eye 10/08/2019   Tobacco use 03/01/2019   Phantom limb pain ([place]) 11/08/2018   Current moderate episode of major depressive disorder (HCC)    Hyperkalemia    Anemia of chronic disease    Bipolar affective disorder ([place])    Heart failure with preserved ejection fraction ([place]) 02/09/2017   Healthcare maintenance 12/08/2016   End-stage renal disease on hemodialysis ([place]) 07/15/2016   OSA (obstructive sleep apnea) 05/13/2016   Morbid obesity due to excess calories ([place]) 11/27/2015   Subclinical hypothyroidism 04/25/2013   Type 2 diabetes mellitus ([place]) 07/12/2012   Carpal tunnel syndrome, bilateral 01/25/2012   Diabetic polyneuropathy associated with type 2 diabetes mellitus ([place]) 07/04/2011   Anxiety and depression 06/02/2011   GERD 12/06/2007   Hyperlipidemia 08/29/2007   Hypertension associated with diabetes ([place]) 08/29/2007    ONSET DATE: 02/03/2022   REFERRING DIAG: H82.993 (ICD-10-CM) - Hx of AKA (above knee amputation), left (HCC)   THERAPY DIAG:  Unsteadiness on feet  Other abnormalities of gait and mobility  Muscle weakness (generalized)  Rationale for Evaluation and Treatment Rehabilitation  SUBJECTIVE:                                                                                                                                                                                              SUBJECTIVE  STATEMENT: Pt reports she is medicated today, so knee pain is bearable. Started using psoriasis cream on her stump and it is feeling a lot better. Does not want to contact her PCP until she is out of cream. Has an appointment with the pain clinic on 12/8. No falls   Pt accompanied by: self  PERTINENT HISTORY:  ESRD, HTN, Chronic Pain, s/p left AKA     PAIN:   Are you having pain? Yes: NPRS scale: 1-2/10 Pain location: R knee and low back Pain description: dull ache   PRECAUTIONS: Fall and Other: On dialysis T/Th/Sat. Fistula in L arm  PATIENT GOALS "I wanna work on walking and maybe strengthening my left knee"   OBJECTIVE:   TODAY'S TREATMENT:  Ther Act  LTG Assessment   [facility] PT Assessment - 04/20/22 0947       Transfers   Five time sit to stand comments  21.5s w/BUE support      Ambulation/Gait   Gait velocity 32.8' over 14s = 2.34 ft/s w/[place] and CGA      Timed Up and Go Test   Normal TUG (seconds) 21.48   AVG of 26.16s, 19.88s, 18.41s w/[place] and all turns to R side           2MWT: 115' loop completed 2x+ 53' = 283' w/[place] and S*. Noted poor eccentric control of LLE as she fatigued (~200')     PATIENT EDUCATION: Education details: continue HEP, POC update, goal outcome  Person educated: Patient Education method: Explanation, Demonstration, and Handouts Education comprehension: verbalized understanding   HOME EXERCISE PROGRAM: Access Code: [email or web address]    GOALS: Goals reviewed with patient? Yes  LONG TERM GOALS: Target date: 04/15/2022  Pt will be independent with final HEP for improved strength, balance, transfers and gait.  Baseline:  Goal status: IN PROGRESS  2.  Pt will improve 5 x STS to less than or equal to 20 seconds w/BUE support to demonstrate improved functional strength and transfer efficiency.   Baseline: 28.78s w/BUE support; 21.5s w/BUE support 11/29 Goal status: IN PROGRESS  3.  Pt will ambulate >/= 260' on 2MWT w/LRAD mod I for improved cardiovascular endurance and functional mobility  Baseline: 217' w/Lofstrand and S*; 283' w/Lofstrand and S*  Goal status: PARTIALLY MET  4.  Pt will improve gait velocity to at least 2.5 ft/s w/LRAD mod I for improved gait efficiency   Baseline: 2.34 ft/s w/[place] and CGA Goal status: IN PROGRESS  NEW LONG TERM GOALS FOR UPDATED POC:    Target date: 05/18/2022  Pt will be independent with final HEP for improved strength, balance, transfers and gait. Baseline:  Baseline:  Goal status: IN PROGRESS  2.  Pt will improve 5 x STS to less than or equal to 18 seconds w/BUE support to demonstrate improved functional strength and transfer efficiency.  Baseline: 28.78s w/BUE support; 21.5s w/BUE support 11/29 Goal status: IN PROGRESS  3.  Pt will improve gait velocity to at least 2.5 ft/s w/LRAD mod I for improved gait efficiency  Baseline: 2.34 ft/s w/L[place]strand and CGA Goal status: INITIAL  4.  Pt will improve normal TUG to less than or equal to 18 seconds w/LRAD mod I for improved functional mobility and decreased fall risk.  Baseline: 21.48s w/[place] Goal status: INITIAL  5.  Pt will ambulate 500' on level indoor surfaces w/LRAD mod I for improved endurance and functional mobility  Baseline:  Goal status: INITIAL   ASSESSMENT:  CLINICAL IMPRESSION: Emphasis of skilled PT session   on LTG assessment and updating POC. Pt has partially met 1 of 4 LTGs and is progressing the remainder. Pt has improved her endurance, indicative by her improvement on 2MWT. Pt continues to be limited by 5x STS due to inconsistent foot placement of L foot and has improved her gait speed but not quite to goal level. Pt continues to be limited by decreased endurance, R knee pain and low back pain but was in much higher spirits today and motivated to improve her physical performance. Pt in agreement to recert at a frequency of 2x/week for 4 weeks to finish the year out and will reassess at end of year. Continue POC.    OBJECTIVE IMPAIRMENTS Abnormal gait, decreased activity tolerance, decreased endurance, decreased knowledge of use of DME, decreased mobility, difficulty walking, decreased strength, increased edema, prosthetic dependency , obesity, and pain.   ACTIVITY LIMITATIONS carrying, lifting, squatting, stairs, transfers, locomotion level, and  caring for others  PARTICIPATION LIMITATIONS: meal prep, cleaning, laundry, driving, shopping, community activity, occupation, and yard work  PERSONAL FACTORS Fitness, Past/current experiences, Transportation, and 1-2 comorbidities: dialysis on T/Th/Sat and chronic pain of R knee  are also affecting patient's functional outcome.   REHAB POTENTIAL: Good  CLINICAL DECISION MAKING: Evolving/moderate complexity  EVALUATION COMPLEXITY: Moderate  PLAN: PT FREQUENCY: 2x/week  PT DURATION: 8 weeks + 4 weeks (recert)   PLANNED INTERVENTIONS: Therapeutic exercises, Therapeutic activity, Neuromuscular re-education, Balance training, Gait training, Patient/Family education, Self Care, Joint mobilization, Stair training, Vestibular training, Canalith repositioning, Prosthetic training, DME instructions, Dry Needling, Cryotherapy, Moist heat, Compression bandaging, Taping, Manual therapy, and Re-evaluation  PLAN FOR NEXT SESSION:  continue gait training w/emphasis on L knee flexion w/toe off and midline orientation, sit <> stand with no UE (needed to kidney transplant)     E , PT, DPT 04/20/2022, 10:13 AM        

## 2022-04-21 DIAGNOSIS — D689 Coagulation defect, unspecified: Secondary | ICD-10-CM | POA: Diagnosis not present

## 2022-04-21 DIAGNOSIS — N186 End stage renal disease: Secondary | ICD-10-CM | POA: Diagnosis not present

## 2022-04-21 DIAGNOSIS — N2581 Secondary hyperparathyroidism of renal origin: Secondary | ICD-10-CM | POA: Diagnosis not present

## 2022-04-21 DIAGNOSIS — L299 Pruritus, unspecified: Secondary | ICD-10-CM | POA: Diagnosis not present

## 2022-04-21 DIAGNOSIS — Z992 Dependence on renal dialysis: Secondary | ICD-10-CM | POA: Diagnosis not present

## 2022-04-21 DIAGNOSIS — D631 Anemia in chronic kidney disease: Secondary | ICD-10-CM | POA: Diagnosis not present

## 2022-04-21 DIAGNOSIS — E1122 Type 2 diabetes mellitus with diabetic chronic kidney disease: Secondary | ICD-10-CM | POA: Diagnosis not present

## 2022-04-21 DIAGNOSIS — D509 Iron deficiency anemia, unspecified: Secondary | ICD-10-CM | POA: Diagnosis not present

## 2022-04-23 DIAGNOSIS — D631 Anemia in chronic kidney disease: Secondary | ICD-10-CM | POA: Diagnosis not present

## 2022-04-23 DIAGNOSIS — N186 End stage renal disease: Secondary | ICD-10-CM | POA: Diagnosis not present

## 2022-04-23 DIAGNOSIS — D689 Coagulation defect, unspecified: Secondary | ICD-10-CM | POA: Diagnosis not present

## 2022-04-23 DIAGNOSIS — L299 Pruritus, unspecified: Secondary | ICD-10-CM | POA: Diagnosis not present

## 2022-04-23 DIAGNOSIS — Z992 Dependence on renal dialysis: Secondary | ICD-10-CM | POA: Diagnosis not present

## 2022-04-23 DIAGNOSIS — D509 Iron deficiency anemia, unspecified: Secondary | ICD-10-CM | POA: Diagnosis not present

## 2022-04-23 DIAGNOSIS — N2581 Secondary hyperparathyroidism of renal origin: Secondary | ICD-10-CM | POA: Diagnosis not present

## 2022-04-23 DIAGNOSIS — R197 Diarrhea, unspecified: Secondary | ICD-10-CM | POA: Diagnosis not present

## 2022-04-23 DIAGNOSIS — R52 Pain, unspecified: Secondary | ICD-10-CM | POA: Diagnosis not present

## 2022-04-25 ENCOUNTER — Ambulatory Visit: Payer: Medicare Other | Admitting: Physical Therapy

## 2022-04-26 ENCOUNTER — Other Ambulatory Visit: Payer: Self-pay | Admitting: Student

## 2022-04-26 DIAGNOSIS — D509 Iron deficiency anemia, unspecified: Secondary | ICD-10-CM | POA: Diagnosis not present

## 2022-04-26 DIAGNOSIS — D689 Coagulation defect, unspecified: Secondary | ICD-10-CM | POA: Diagnosis not present

## 2022-04-26 DIAGNOSIS — D631 Anemia in chronic kidney disease: Secondary | ICD-10-CM | POA: Diagnosis not present

## 2022-04-26 DIAGNOSIS — Z992 Dependence on renal dialysis: Secondary | ICD-10-CM | POA: Diagnosis not present

## 2022-04-26 DIAGNOSIS — L299 Pruritus, unspecified: Secondary | ICD-10-CM | POA: Diagnosis not present

## 2022-04-26 DIAGNOSIS — R52 Pain, unspecified: Secondary | ICD-10-CM | POA: Diagnosis not present

## 2022-04-26 DIAGNOSIS — R197 Diarrhea, unspecified: Secondary | ICD-10-CM | POA: Diagnosis not present

## 2022-04-26 DIAGNOSIS — N186 End stage renal disease: Secondary | ICD-10-CM | POA: Diagnosis not present

## 2022-04-26 DIAGNOSIS — N2581 Secondary hyperparathyroidism of renal origin: Secondary | ICD-10-CM | POA: Diagnosis not present

## 2022-04-27 ENCOUNTER — Ambulatory Visit
Payer: Medicare Other | Attending: Student in an Organized Health Care Education/Training Program | Admitting: Physical Therapy

## 2022-04-27 DIAGNOSIS — R296 Repeated falls: Secondary | ICD-10-CM | POA: Insufficient documentation

## 2022-04-27 DIAGNOSIS — R2681 Unsteadiness on feet: Secondary | ICD-10-CM | POA: Diagnosis not present

## 2022-04-27 DIAGNOSIS — M25652 Stiffness of left hip, not elsewhere classified: Secondary | ICD-10-CM | POA: Insufficient documentation

## 2022-04-27 DIAGNOSIS — R2689 Other abnormalities of gait and mobility: Secondary | ICD-10-CM | POA: Insufficient documentation

## 2022-04-27 DIAGNOSIS — M25561 Pain in right knee: Secondary | ICD-10-CM | POA: Diagnosis not present

## 2022-04-27 DIAGNOSIS — R293 Abnormal posture: Secondary | ICD-10-CM | POA: Diagnosis not present

## 2022-04-27 DIAGNOSIS — G8929 Other chronic pain: Secondary | ICD-10-CM | POA: Insufficient documentation

## 2022-04-27 DIAGNOSIS — M6281 Muscle weakness (generalized): Secondary | ICD-10-CM | POA: Diagnosis not present

## 2022-04-27 NOTE — Therapy (Signed)
OUTPATIENT PHYSICAL THERAPY NEURO TREATMENT   Patient Name: [name] [name] MRN: [id number] DOB:1975-10-09, 46 y.o., female Today's Date: 04/27/2022   PCP: Riesa Pope, MD REFERRING PROVIDER: Aldine Contes, MD      PT End of Session - 04/27/22 0849     Visit Number 11    Number of Visits 25   Recert   Date for PT Re-Evaluation 07/37/10   Recert   Authorization Type UHC Medicare/Medicaid of [place]    PT Start Time [phone]    PT Stop Time 0931    PT Time Calculation (min) 44 min    Equipment Utilized During Treatment Gait belt    Activity Tolerance Patient tolerated treatment well    Behavior During Therapy [facility] for tasks assessed/performed                Past Medical History:  Diagnosis Date   Abdominal muscle pain 09/08/2016   Abnormal Pap smear of cervix 2009   Abscess    history of multiple abscesses   Acute bilateral low back pain 02/13/2017   Acute blood loss anemia    Acute on chronic renal failure ([place]) 07/12/2012   Acute renal failure ([place]) 07/12/2012   Acute respiratory failure with hypoxia ([place]) 10/21/2019   Adjustment disorder with depressed mood 03/26/2017   AKI (acute kidney injury) ([place])    Anemia 10/21/2019   Anemia of chronic disease 2002   Anginal pain (HCC)    Anxiety    Panic attacks   Bilateral lower extremity edema 05/13/2016   Bipolar disorder ([place])    Cataract    B/L cataract   Cellulitis 05/21/2014   right eye   CHF (congestive heart failure) ([place])    Chronic bronchitis ([place])    "get it q yr" (05/13/2013)   Chronic pain    Chronic pain of right knee 09/08/2016   Dehiscence of amputation stump (HCC)    Depression    Edema of lower extremity    Endocarditis 2002   subacute bacterial endocarditis.    Fall    Family history of anesthesia complication    "my mom has a hard time coming out from under"   Fibromyalgia    GERD (gastroesophageal reflux disease)    occ   Heart murmur    Herpes simplex type 1 infection  01/16/2018   History of blood transfusion    "just low blood count" (05/13/2013)   Hyperlipidemia    Hypertension    Hypothyroidism    Hypothyroidism, adult 03/21/2014   Hypoxia    Leukocytosis    Muscle spasm 12/16/2020   Necrosis ([place])    and ulceration   Necrotizing fasciitis s/p OR debridements 07/06/2012   Obesity    OSA on CPAP    does not wear all the time   Peripheral neuropathy    Pneumonia    Pyelonephritis 12/31/2020   Routine adult health maintenance 12/08/2016   Severe protein-calorie malnutrition ([place])    Status post below knee amputation, left ([place]) 04/11/2017   Type II diabetes mellitus ([place])    Type  II   Vaginal irritation 03/14/2016   Weak pulse 07/21/2021   Wound dehiscence 09/04/2019   Past Surgical History:  Procedure Laterality Date   A/V FISTULAGRAM Left 05/12/2021   Procedure: A/V FISTULAGRAM;  Surgeon: Broadus John, MD;  Location: [place] [facility];  Service: Cardiovascular;  Laterality: Left;   ABDOMINAL AORTOGRAM W/LOWER EXTREMITY Left 03/19/2019   Procedure: ABDOMINAL AORTOGRAM W/LOWER EXTREMITY;  Surgeon: Harold Barban  W, MD;  Location: [place] [facility];  Service: Cardiovascular;  Laterality: Left;   ABOVE KNEE LEG AMPUTATION Left 10/11/2019   wound dehisence    AIR/FLUID EXCHANGE Right 12/31/2019   Procedure: AIR/FLUID EXCHANGE;  Surgeon: Jalene Mullet, MD;  Location: [place];  Service: Ophthalmology;  Laterality: Right;   AMPUTATION Left 04/07/2017   Procedure: LEFT BELOW KNEE AMPUTATION;  Surgeon: Newt Minion, MD;  Location: [place];  Service: Orthopedics;  Laterality: Left;   AMPUTATION Left 10/11/2019   Procedure: LEFT ABOVE KNEE AMPUTATION;  Surgeon: Newt Minion, MD;  Location: [place];  Service: Orthopedics;  Laterality: Left;   AV FISTULA PLACEMENT Left 01/05/2021   Procedure: ARTERIOVENOUS (AV) FISTULA CREATION vs. GRAFT;  Surgeon: Angelia Mould, MD;  Location: [facility];  Service: Vascular;  Laterality: Left;   CATARACT  EXTRACTION W/PHACO Right 02/11/2020   Procedure: CATARACT EXTRACTION PHACO AND INTRAOCULAR LENS PLACEMENT ([place]);  Surgeon: Jalene Mullet, MD;  Location: [place];  Service: Ophthalmology;  Laterality: Right;   EYE SURGERY     lazer   FISTULA SUPERFICIALIZATION Left 02/23/2021   Procedure: SUPERFICIALIZATION OF LEFT BRACHIOCEPHALIC FISTULA;  Surgeon: Angelia Mould, MD;  Location: [place];  Service: Vascular;  Laterality: Left;   GAS INSERTION Right 12/31/2019   Procedure: INSERTION OF GAS;  Surgeon: Jalene Mullet, MD;  Location: [place];  Service: Ophthalmology;  Laterality: Right;   INCISION AND DRAINAGE ABSCESS     multiple I&Ds   INCISION AND DRAINAGE ABSCESS Left 07/09/2012   Procedure: DRESSING CHANGE, THIGH WOUND;  Surgeon: Harl Bowie, MD;  Location: [place];  Service: General;  Laterality: Left;   INCISION AND DRAINAGE OF WOUND Left 07/07/2012   Procedure: IRRIGATION AND DEBRIDEMENT WOUND;  Surgeon: Harl Bowie, MD;  Location: [place];  Service: General;  Laterality: Left;   INCISION AND DRAINAGE PERIRECTAL ABSCESS Left 07/14/2012   Procedure: DEBRIDEMENT OF SKIN & SOFT TISSUE; DRESSING CHANGE UNDER ANESTHESIA;  Surgeon: Gayland Curry, MD,FACS;  Location: [place];  Service: General;  Laterality: Left;   INCISION AND DRAINAGE PERIRECTAL ABSCESS Left 07/16/2012   Procedure: I&D Left Thigh;  Surgeon: Gwenyth Ober, MD;  Location: [place];  Service: General;  Laterality: Left;   INCISION AND DRAINAGE PERIRECTAL ABSCESS N/A 01/05/2015   Procedure: IRRIGATION AND DEBRIDEMENT PERIRECTAL ABSCESS;  Surgeon: Donnie Mesa, MD;  Location: [place];  Service: General;  Laterality: N/A;   INSERTION OF DIALYSIS CATHETER Right 01/05/2021   Procedure: INSERTION OF DIALYSIS CATHETER;  Surgeon: Angelia Mould, MD;  Location: [place];  Service: Vascular;  Laterality: Right;   IRRIGATION AND DEBRIDEMENT ABSCESS Left 07/06/2012   Procedure: IRRIGATION AND DEBRIDEMENT ABSCESS BUTTOCKS AND  THIGH;  Surgeon: Shann Medal, MD;  Location: [place];  Service: General;  Laterality: Left;   IRRIGATION AND DEBRIDEMENT ABSCESS Left 08/10/2012   Procedure: IRRIGATION AND DEBRIDEMENT ABSCESS;  Surgeon: Madilyn Hook, DO;  Location: [place];  Service: General;  Laterality: Left;   PARS PLANA VITRECTOMY Right 12/31/2019   Procedure: PARS PLANA VITRECTOMY WITH 25 GAUGE;  Surgeon: Jalene Mullet, MD;  Location: [place];  Service: Ophthalmology;  Laterality: Right;   PERIPHERAL VASCULAR INTERVENTION Left 05/12/2021   Procedure: PERIPHERAL VASCULAR INTERVENTION;  Surgeon: Broadus John, MD;  Location: [place] [facility];  Service: Cardiovascular;  Laterality: Left;  arm fistula   PHOTOCOAGULATION WITH LASER Right 12/31/2019   Procedure: PHOTOCOAGULATION WITH LASER;  Surgeon: Jalene Mullet, MD;  Location: [place];  Service: Ophthalmology;  Laterality: Right;   REPAIR OF COMPLEX TRACTION RETINAL DETACHMENT Right 12/31/2019   Procedure: REPAIR OF COMPLEX TRACTION RETINAL;  Surgeon: Jalene Mullet, MD;  Location: [place];  Service: Ophthalmology;  Laterality: Right;   STUMP REVISION Left 06/09/2017   Procedure: REVISION LEFT BELOW KNEE AMPUTATION;  Surgeon: Newt Minion, MD;  Location: [place];  Service: Orthopedics;  Laterality: Left;   STUMP REVISION Left 09/04/2019   Procedure: LEFT BELOW KNEE AMPUTATION REVISION;  Surgeon: Newt Minion, MD;  Location: [place];  Service: Orthopedics;  Laterality: Left;   STUMP REVISION Left 09/20/2019   Procedure: LEFT BELOW KNEE AMPUTATION REVISION;  Surgeon: Newt Minion, MD;  Location: [place];  Service: Orthopedics;  Laterality: Left;   TONSILLECTOMY  1994   Patient Active Problem List   Diagnosis Date Noted   Urgency of urination 09/12/2021   Hidradenitis suppurativa 02/24/2021   Cough 05/26/2020   Chronic pain syndrome 01/16/2020   Hx of AKA (above knee amputation), left (HCC)    Blurry vision, right eye 10/08/2019   Tobacco use 03/01/2019   Phantom limb  pain ([place]) 11/08/2018   Current moderate episode of major depressive disorder (HCC)    Hyperkalemia    Anemia of chronic disease    Bipolar affective disorder ([place])    Heart failure with preserved ejection fraction ([place]) 02/09/2017   Healthcare maintenance 12/08/2016   End-stage renal disease on hemodialysis ([place]) 07/15/2016   OSA (obstructive sleep apnea) 05/13/2016   Morbid obesity due to excess calories ([place]) 11/27/2015   Subclinical hypothyroidism 04/25/2013   Type 2 diabetes mellitus ([place]) 07/12/2012   Carpal tunnel syndrome, bilateral 01/25/2012   Diabetic polyneuropathy associated with type 2 diabetes mellitus ([place]) 07/04/2011   Anxiety and depression 06/02/2011   GERD 12/06/2007   Hyperlipidemia 08/29/2007   Hypertension associated with diabetes ([place]) 08/29/2007    ONSET DATE: 02/03/2022   REFERRING DIAG: G29.528 (ICD-10-CM) - Hx of AKA (above knee amputation), left (HCC)   THERAPY DIAG:  Unsteadiness on feet  Other abnormalities of gait and mobility  Muscle weakness (generalized)  Rationale for Evaluation and Treatment Rehabilitation  SUBJECTIVE:                                                                                                                                                                                              SUBJECTIVE STATEMENT: Pt reporting high levels of pain in her R knee today. Denies falls or other changes. HEP is "alright".    Pt accompanied by: self  PERTINENT HISTORY:  ESRD, HTN, Chronic Pain, s/p left AKA  PAIN:  Are you having pain? Yes: NPRS scale: 6-7/10 Pain location: R knee Pain description: dull ache   PRECAUTIONS: Fall and Other: On dialysis T/Th/Sat. Fistula in L arm  PATIENT GOALS "I wanna work on walking and maybe strengthening my left knee"   OBJECTIVE:   TODAY'S TREATMENT:  NMR  In // bars for facilitation of L knee flexion, single leg stability and BLE strength:  On rockerboard in L/R direction  w/intermittent BUE support, standing weight shifts to work on midline orientation and weight bearing tolerance on LLE. Noted R lateral lean preference and overcorrection of balance via hip strategy.  Progressed to mini squats x10 w/BUE support to facilitate knee flexion of LLE and equal weightbearing. Mod verbal cues for proper body mechanics, as pt relying heavily on forward trunk lean to perform rather than squat. Min cues to use mirror to facilitate upright posture and pt able to perform w/heavy BUE support.  Alt gumdrop taps w/BUE support and progressing to RUE support only, x15 per side. Pt required extra time to facilitate L knee flexion and did demonstrate L hip hike to perform.  Staggered stance RDLs (LLE forward), 2x6, w/5#KB to 8" box target for improved eccentric control of LLE and global hip strengthening. Pt w/significant difficulty performing task due to pain but no knee buckling or instability noted. Pt required lengthy rest break in between sets due to fatigue.   Ther Ex  SciFit multi-peaks level 7 for 8 minutes using BUE/BLEs for neural priming for reciprocal movement, dynamic cardiovascular conditioning and global strengthening.    PATIENT EDUCATION: Education details: continue HEP  Person educated: Patient Education method: Explanation, Media planner, and Handouts Education comprehension: verbalized understanding   HOME EXERCISE PROGRAM: Access Code: [email or web address]    GOALS: Goals reviewed with patient? Yes  LONG TERM GOALS: Target date: 04/15/2022  Pt will be independent with final HEP for improved strength, balance, transfers and gait.  Baseline:  Goal status: IN PROGRESS  2.  Pt will improve 5 x STS to less than or equal to 20 seconds w/BUE support to demonstrate improved functional strength and transfer efficiency.   Baseline: 28.78s w/BUE support; 21.5s w/BUE support 11/29 Goal status: IN PROGRESS  3.  Pt will ambulate >/= 260' on 2MWT w/LRAD mod I for improved  cardiovascular endurance and functional mobility  Baseline: 55' w/[place] and S*; 283' w/[place] and S*  Goal status: PARTIALLY MET  4.  Pt will improve gait velocity to at least 2.5 ft/s w/LRAD mod I for improved gait efficiency   Baseline: 2.34 ft/s w/[place] and CGA Goal status: IN PROGRESS  NEW LONG TERM GOALS FOR UPDATED POC:   Target date: 05/18/2022  Pt will be independent with final HEP for improved strength, balance, transfers and gait. Baseline:  Baseline:  Goal status: IN PROGRESS  2.  Pt will improve 5 x STS to less than or equal to 18 seconds w/BUE support to demonstrate improved functional strength and transfer efficiency.  Baseline: 28.78s w/BUE support; 21.5s w/BUE support 11/29 Goal status: IN PROGRESS  3.  Pt will improve gait velocity to at least 2.5 ft/s w/LRAD mod I for improved gait efficiency  Baseline: 2.34 ft/s w/[place] and CGA Goal status: INITIAL  4.  Pt will improve normal TUG to less than or equal to 18 seconds w/LRAD mod I for improved functional mobility and decreased fall risk.  Baseline: 21.48s w/[place] Goal status: INITIAL  5.  Pt will ambulate 500' on level indoor surfaces w/LRAD mod I for improved  endurance and functional mobility  Baseline:  Goal status: INITIAL   ASSESSMENT:  CLINICAL IMPRESSION: Emphasis of skilled PT session on facilitating L knee flexion, single leg stability and global strengthening. Pt continues to be limited by high levels of R knee pain and back pain after standing for prolonged periods (>5 minutes). Pt able to facilitate proper knee flexion of LLE w/extra time and use of visual cues without circumduction compensation. However, pt fatigues very quickly and is unable to sustain movement. Continue POC.    OBJECTIVE IMPAIRMENTS Abnormal gait, decreased activity tolerance, decreased endurance, decreased knowledge of use of DME, decreased mobility, difficulty walking, decreased strength, increased edema,  prosthetic dependency , obesity, and pain.   ACTIVITY LIMITATIONS carrying, lifting, squatting, stairs, transfers, locomotion level, and caring for others  PARTICIPATION LIMITATIONS: meal prep, cleaning, laundry, driving, shopping, community activity, occupation, and yard work  PERSONAL Education officer, community, Past/current experiences, Transportation, and 1-2 comorbidities: dialysis on T/Th/Sat and chronic pain of R knee  are also affecting patient's functional outcome.   REHAB POTENTIAL: Good  CLINICAL DECISION MAKING: Evolving/moderate complexity  EVALUATION COMPLEXITY: Moderate  PLAN: PT FREQUENCY: 2x/week  PT DURATION: 8 weeks + 4 weeks (recert)   PLANNED INTERVENTIONS: Therapeutic exercises, Therapeutic activity, Neuromuscular re-education, Balance training, Gait training, Patient/Family education, Self Care, Joint mobilization, Stair training, Vestibular training, Canalith repositioning, Prosthetic training, DME instructions, Dry Needling, Cryotherapy, Moist heat, Compression bandaging, Taping, Manual therapy, and Re-evaluation  PLAN FOR NEXT SESSION:  continue gait training w/emphasis on L knee flexion w/toe off and midline orientation, sit <> stand with no UE (needed to kidney transplant)    Cruzita Lederer Jarell Mcewen, PT, DPT 04/27/2022, 9:37 AM

## 2022-04-28 DIAGNOSIS — R197 Diarrhea, unspecified: Secondary | ICD-10-CM | POA: Diagnosis not present

## 2022-04-28 DIAGNOSIS — N186 End stage renal disease: Secondary | ICD-10-CM | POA: Diagnosis not present

## 2022-04-28 DIAGNOSIS — D631 Anemia in chronic kidney disease: Secondary | ICD-10-CM | POA: Diagnosis not present

## 2022-04-28 DIAGNOSIS — D509 Iron deficiency anemia, unspecified: Secondary | ICD-10-CM | POA: Diagnosis not present

## 2022-04-28 DIAGNOSIS — L299 Pruritus, unspecified: Secondary | ICD-10-CM | POA: Diagnosis not present

## 2022-04-28 DIAGNOSIS — Z992 Dependence on renal dialysis: Secondary | ICD-10-CM | POA: Diagnosis not present

## 2022-04-28 DIAGNOSIS — R52 Pain, unspecified: Secondary | ICD-10-CM | POA: Diagnosis not present

## 2022-04-28 DIAGNOSIS — N2581 Secondary hyperparathyroidism of renal origin: Secondary | ICD-10-CM | POA: Diagnosis not present

## 2022-04-28 DIAGNOSIS — D689 Coagulation defect, unspecified: Secondary | ICD-10-CM | POA: Diagnosis not present

## 2022-04-29 ENCOUNTER — Ambulatory Visit: Payer: Medicare Other | Admitting: Physical Therapy

## 2022-04-29 DIAGNOSIS — M25562 Pain in left knee: Secondary | ICD-10-CM | POA: Diagnosis not present

## 2022-04-29 DIAGNOSIS — M545 Low back pain, unspecified: Secondary | ICD-10-CM | POA: Diagnosis not present

## 2022-04-29 DIAGNOSIS — I159 Secondary hypertension, unspecified: Secondary | ICD-10-CM | POA: Diagnosis not present

## 2022-04-29 DIAGNOSIS — Z992 Dependence on renal dialysis: Secondary | ICD-10-CM | POA: Diagnosis not present

## 2022-04-29 DIAGNOSIS — N186 End stage renal disease: Secondary | ICD-10-CM | POA: Diagnosis not present

## 2022-04-29 DIAGNOSIS — M25519 Pain in unspecified shoulder: Secondary | ICD-10-CM | POA: Diagnosis not present

## 2022-04-29 DIAGNOSIS — G894 Chronic pain syndrome: Secondary | ICD-10-CM | POA: Diagnosis not present

## 2022-04-29 DIAGNOSIS — G4733 Obstructive sleep apnea (adult) (pediatric): Secondary | ICD-10-CM | POA: Diagnosis not present

## 2022-04-29 DIAGNOSIS — M79643 Pain in unspecified hand: Secondary | ICD-10-CM | POA: Diagnosis not present

## 2022-04-29 DIAGNOSIS — G546 Phantom limb syndrome with pain: Secondary | ICD-10-CM | POA: Diagnosis not present

## 2022-04-29 DIAGNOSIS — G890 Central pain syndrome: Secondary | ICD-10-CM | POA: Diagnosis not present

## 2022-04-29 DIAGNOSIS — Z79891 Long term (current) use of opiate analgesic: Secondary | ICD-10-CM | POA: Diagnosis not present

## 2022-04-30 DIAGNOSIS — D631 Anemia in chronic kidney disease: Secondary | ICD-10-CM | POA: Diagnosis not present

## 2022-04-30 DIAGNOSIS — R52 Pain, unspecified: Secondary | ICD-10-CM | POA: Diagnosis not present

## 2022-04-30 DIAGNOSIS — R197 Diarrhea, unspecified: Secondary | ICD-10-CM | POA: Diagnosis not present

## 2022-04-30 DIAGNOSIS — D689 Coagulation defect, unspecified: Secondary | ICD-10-CM | POA: Diagnosis not present

## 2022-04-30 DIAGNOSIS — N2581 Secondary hyperparathyroidism of renal origin: Secondary | ICD-10-CM | POA: Diagnosis not present

## 2022-04-30 DIAGNOSIS — L299 Pruritus, unspecified: Secondary | ICD-10-CM | POA: Diagnosis not present

## 2022-04-30 DIAGNOSIS — Z992 Dependence on renal dialysis: Secondary | ICD-10-CM | POA: Diagnosis not present

## 2022-04-30 DIAGNOSIS — D509 Iron deficiency anemia, unspecified: Secondary | ICD-10-CM | POA: Diagnosis not present

## 2022-04-30 DIAGNOSIS — N186 End stage renal disease: Secondary | ICD-10-CM | POA: Diagnosis not present

## 2022-05-02 ENCOUNTER — Ambulatory Visit: Payer: Medicare Other

## 2022-05-03 DIAGNOSIS — N186 End stage renal disease: Secondary | ICD-10-CM | POA: Diagnosis not present

## 2022-05-03 DIAGNOSIS — N2581 Secondary hyperparathyroidism of renal origin: Secondary | ICD-10-CM | POA: Diagnosis not present

## 2022-05-03 DIAGNOSIS — D631 Anemia in chronic kidney disease: Secondary | ICD-10-CM | POA: Diagnosis not present

## 2022-05-03 DIAGNOSIS — R197 Diarrhea, unspecified: Secondary | ICD-10-CM | POA: Diagnosis not present

## 2022-05-03 DIAGNOSIS — L299 Pruritus, unspecified: Secondary | ICD-10-CM | POA: Diagnosis not present

## 2022-05-03 DIAGNOSIS — R52 Pain, unspecified: Secondary | ICD-10-CM | POA: Diagnosis not present

## 2022-05-03 DIAGNOSIS — Z992 Dependence on renal dialysis: Secondary | ICD-10-CM | POA: Diagnosis not present

## 2022-05-03 DIAGNOSIS — D509 Iron deficiency anemia, unspecified: Secondary | ICD-10-CM | POA: Diagnosis not present

## 2022-05-03 DIAGNOSIS — D689 Coagulation defect, unspecified: Secondary | ICD-10-CM | POA: Diagnosis not present

## 2022-05-04 ENCOUNTER — Ambulatory Visit: Payer: Medicare Other

## 2022-05-05 DIAGNOSIS — R197 Diarrhea, unspecified: Secondary | ICD-10-CM | POA: Diagnosis not present

## 2022-05-05 DIAGNOSIS — N2581 Secondary hyperparathyroidism of renal origin: Secondary | ICD-10-CM | POA: Diagnosis not present

## 2022-05-05 DIAGNOSIS — Z992 Dependence on renal dialysis: Secondary | ICD-10-CM | POA: Diagnosis not present

## 2022-05-05 DIAGNOSIS — N186 End stage renal disease: Secondary | ICD-10-CM | POA: Diagnosis not present

## 2022-05-05 DIAGNOSIS — L299 Pruritus, unspecified: Secondary | ICD-10-CM | POA: Diagnosis not present

## 2022-05-05 DIAGNOSIS — D509 Iron deficiency anemia, unspecified: Secondary | ICD-10-CM | POA: Diagnosis not present

## 2022-05-05 DIAGNOSIS — D689 Coagulation defect, unspecified: Secondary | ICD-10-CM | POA: Diagnosis not present

## 2022-05-05 DIAGNOSIS — R52 Pain, unspecified: Secondary | ICD-10-CM | POA: Diagnosis not present

## 2022-05-05 DIAGNOSIS — D631 Anemia in chronic kidney disease: Secondary | ICD-10-CM | POA: Diagnosis not present

## 2022-05-06 ENCOUNTER — Ambulatory Visit: Payer: Medicare Other | Admitting: Physical Therapy

## 2022-05-06 ENCOUNTER — Ambulatory Visit: Payer: Medicare Other

## 2022-05-06 VITALS — BP 128/63 | HR 91

## 2022-05-06 DIAGNOSIS — M25652 Stiffness of left hip, not elsewhere classified: Secondary | ICD-10-CM | POA: Diagnosis not present

## 2022-05-06 DIAGNOSIS — R296 Repeated falls: Secondary | ICD-10-CM

## 2022-05-06 DIAGNOSIS — M6281 Muscle weakness (generalized): Secondary | ICD-10-CM | POA: Diagnosis not present

## 2022-05-06 DIAGNOSIS — R2681 Unsteadiness on feet: Secondary | ICD-10-CM

## 2022-05-06 DIAGNOSIS — G8929 Other chronic pain: Secondary | ICD-10-CM

## 2022-05-06 DIAGNOSIS — M25561 Pain in right knee: Secondary | ICD-10-CM | POA: Diagnosis not present

## 2022-05-06 DIAGNOSIS — R293 Abnormal posture: Secondary | ICD-10-CM

## 2022-05-06 DIAGNOSIS — R2689 Other abnormalities of gait and mobility: Secondary | ICD-10-CM | POA: Diagnosis not present

## 2022-05-06 NOTE — Therapy (Signed)
OUTPATIENT PHYSICAL THERAPY NEURO TREATMENT   Patient Name: [name] [name] MRN: [id number] DOB:1976/01/08, 46 y.o., female Today's Date: 05/06/2022  PCP: Riesa Pope, MD REFERRING PROVIDER: Aldine Contes, MD     PT End of Session - 05/06/22 1318     Visit Number 12    Number of Visits 25    Date for PT Re-Evaluation 05/18/22    Authorization Type UHC Medicare/Medicaid of [place]    Authorization Time Period 17 visits 08/09/21-10/08/21; 16 visits  5/22- 12/06/2021    Authorization - Number of Visits 16    Progress Note Due on Visit 30    PT Start Time 1317    PT Stop Time 1400    PT Time Calculation (min) 43 min    Equipment Utilized During Treatment Gait belt    Activity Tolerance Patient tolerated treatment well    Behavior During Therapy [place] for tasks assessed/performed                Past Medical History:  Diagnosis Date   Abdominal muscle pain 09/08/2016   Abnormal Pap smear of cervix 2009   Abscess    history of multiple abscesses   Acute bilateral low back pain 02/13/2017   Acute blood loss anemia    Acute on chronic renal failure ([place]) 07/12/2012   Acute renal failure ([place]) 07/12/2012   Acute respiratory failure with hypoxia ([place]) 10/21/2019   Adjustment disorder with depressed mood 03/26/2017   AKI (acute kidney injury) ([place])    Anemia 10/21/2019   Anemia of chronic disease 2002   Anginal pain (HCC)    Anxiety    Panic attacks   Bilateral lower extremity edema 05/13/2016   Bipolar disorder ([place])    Cataract    B/L cataract   Cellulitis 05/21/2014   right eye   CHF (congestive heart failure) ([place])    Chronic bronchitis ([place])    "get it q yr" (05/13/2013)   Chronic pain    Chronic pain of right knee 09/08/2016   Dehiscence of amputation stump ([place])    Depression    Edema of lower extremity    Endocarditis 2002   subacute bacterial endocarditis.    Fall    Family history of anesthesia complication    "my mom has a hard time coming  out from under"   Fibromyalgia    GERD (gastroesophageal reflux disease)    occ   Heart murmur    Herpes simplex type 1 infection 01/16/2018   History of blood transfusion    "just low blood count" (05/13/2013)   Hyperlipidemia    Hypertension    Hypothyroidism    Hypothyroidism, adult 03/21/2014   Hypoxia    Leukocytosis    Muscle spasm 12/16/2020   Necrosis ([place])    and ulceration   Necrotizing fasciitis s/p OR debridements 07/06/2012   Obesity    OSA on CPAP    does not wear all the time   Peripheral neuropathy    Pneumonia    Pyelonephritis 12/31/2020   Routine adult health maintenance 12/08/2016   Severe protein-calorie malnutrition ([place])    Status post below knee amputation, left ([place]) 04/11/2017   Type II diabetes mellitus ([place])    Type  II   Vaginal irritation 03/14/2016   Weak pulse 07/21/2021   Wound dehiscence 09/04/2019   Past Surgical History:  Procedure Laterality Date   A/V FISTULAGRAM Left 05/12/2021   Procedure: A/V FISTULAGRAM;  Surgeon: Broadus John, MD;  Location: [facility];  Service: Cardiovascular;  Laterality: Left;   ABDOMINAL AORTOGRAM W/LOWER EXTREMITY Left 03/19/2019   Procedure: ABDOMINAL AORTOGRAM W/LOWER EXTREMITY;  Surgeon: Serafina Mitchell, MD;  Location: [facility];  Service: Cardiovascular;  Laterality: Left;   ABOVE KNEE LEG AMPUTATION Left 10/11/2019   wound dehisence    AIR/FLUID EXCHANGE Right 12/31/2019   Procedure: AIR/FLUID EXCHANGE;  Surgeon: Jalene Mullet, MD;  Location: [facility];  Service: Ophthalmology;  Laterality: Right;   AMPUTATION Left 04/07/2017   Procedure: LEFT BELOW KNEE AMPUTATION;  Surgeon: Newt Minion, MD;  Location: [facility];  Service: Orthopedics;  Laterality: Left;   AMPUTATION Left 10/11/2019   Procedure: LEFT ABOVE KNEE AMPUTATION;  Surgeon: Newt Minion, MD;  Location: [facility];  Service: Orthopedics;  Laterality: Left;   AV FISTULA PLACEMENT Left 01/05/2021   Procedure: ARTERIOVENOUS (AV)  FISTULA CREATION vs. GRAFT;  Surgeon: Angelia Mould, MD;  Location: [facility];  Service: Vascular;  Laterality: Left;   CATARACT EXTRACTION W/PHACO Right 02/11/2020   Procedure: CATARACT EXTRACTION PHACO AND INTRAOCULAR LENS PLACEMENT ([place]);  Surgeon: Jalene Mullet, MD;  Location: [facility];  Service: Ophthalmology;  Laterality: Right;   EYE SURGERY     lazer   FISTULA SUPERFICIALIZATION Left 02/23/2021   Procedure: SUPERFICIALIZATION OF LEFT BRACHIOCEPHALIC FISTULA;  Surgeon: Angelia Mould, MD;  Location: [place];  Service: Vascular;  Laterality: Left;   GAS INSERTION Right 12/31/2019   Procedure: INSERTION OF GAS;  Surgeon: Jalene Mullet, MD;  Location: [facility];  Service: Ophthalmology;  Laterality: Right;   INCISION AND DRAINAGE ABSCESS     multiple I&Ds   INCISION AND DRAINAGE ABSCESS Left 07/09/2012   Procedure: DRESSING CHANGE, THIGH WOUND;  Surgeon: Harl Bowie, MD;  Location: [facility];  Service: General;  Laterality: Left;   INCISION AND DRAINAGE OF WOUND Left 07/07/2012   Procedure: IRRIGATION AND DEBRIDEMENT WOUND;  Surgeon: Harl Bowie, MD;  Location: [facility];  Service: General;  Laterality: Left;   INCISION AND DRAINAGE PERIRECTAL ABSCESS Left 07/14/2012   Procedure: DEBRIDEMENT OF SKIN & SOFT TISSUE; DRESSING CHANGE UNDER ANESTHESIA;  Surgeon: Gayland Curry, MD,FACS;  Location: [facility];  Service: General;  Laterality: Left;   INCISION AND DRAINAGE PERIRECTAL ABSCESS Left 07/16/2012   Procedure: I&D Left Thigh;  Surgeon: Gwenyth Ober, MD;  Location: [facility];  Service: General;  Laterality: Left;   INCISION AND DRAINAGE PERIRECTAL ABSCESS N/A 01/05/2015   Procedure: IRRIGATION AND DEBRIDEMENT PERIRECTAL ABSCESS;  Surgeon: Donnie Mesa, MD;  Location: [facility];  Service: General;  Laterality: N/A;   INSERTION OF DIALYSIS CATHETER Right 01/05/2021   Procedure: INSERTION OF DIALYSIS CATHETER;  Surgeon: Angelia Mould, MD;  Location: [facility];  Service: Vascular;   Laterality: Right;   IRRIGATION AND DEBRIDEMENT ABSCESS Left 07/06/2012   Procedure: IRRIGATION AND DEBRIDEMENT ABSCESS BUTTOCKS AND THIGH;  Surgeon: Shann Medal, MD;  Location: [facility] [place];  Service: General;  Laterality: Left;   IRRIGATION AND DEBRIDEMENT ABSCESS Left 08/10/2012   Procedure: IRRIGATION AND DEBRIDEMENT ABSCESS;  Surgeon: Madilyn Hook, DO;  Location: [facility];  Service: General;  Laterality: Left;   PARS PLANA VITRECTOMY Right 12/31/2019   Procedure: PARS PLANA VITRECTOMY WITH 25 GAUGE;  Surgeon: Jalene Mullet, MD;  Location: [facility];  Service: Ophthalmology;  Laterality: Right;   PERIPHERAL VASCULAR INTERVENTION Left 05/12/2021   Procedure: PERIPHERAL VASCULAR INTERVENTION;  Surgeon: Broadus John, MD;  Location: [facility];  Service: Cardiovascular;  Laterality:  Left;  arm fistula   PHOTOCOAGULATION WITH LASER Right 12/31/2019   Procedure: PHOTOCOAGULATION WITH LASER;  Surgeon: Jalene Mullet, MD;  Location: [facility];  Service: Ophthalmology;  Laterality: Right;   REPAIR OF COMPLEX TRACTION RETINAL DETACHMENT Right 12/31/2019   Procedure: REPAIR OF COMPLEX TRACTION RETINAL;  Surgeon: Jalene Mullet, MD;  Location: [facility];  Service: Ophthalmology;  Laterality: Right;   STUMP REVISION Left 06/09/2017   Procedure: REVISION LEFT BELOW KNEE AMPUTATION;  Surgeon: Newt Minion, MD;  Location: [place];  Service: Orthopedics;  Laterality: Left;   STUMP REVISION Left 09/04/2019   Procedure: LEFT BELOW KNEE AMPUTATION REVISION;  Surgeon: Newt Minion, MD;  Location: [facility];  Service: Orthopedics;  Laterality: Left;   STUMP REVISION Left 09/20/2019   Procedure: LEFT BELOW KNEE AMPUTATION REVISION;  Surgeon: Newt Minion, MD;  Location: [place];  Service: Orthopedics;  Laterality: Left;   TONSILLECTOMY  1994   Patient Active Problem List   Diagnosis Date Noted   Urgency of urination 09/12/2021   Hidradenitis suppurativa 02/24/2021   Cough 05/26/2020   Chronic pain syndrome  01/16/2020   Hx of AKA (above knee amputation), left (HCC)    Blurry vision, right eye 10/08/2019   Tobacco use 03/01/2019   Phantom limb pain ([place]) 11/08/2018   Current moderate episode of major depressive disorder (HCC)    Hyperkalemia    Anemia of chronic disease    Bipolar affective disorder ([place])    Heart failure with preserved ejection fraction ([place]) 02/09/2017   Healthcare maintenance 12/08/2016   End-stage renal disease on hemodialysis ([place]) 07/15/2016   OSA (obstructive sleep apnea) 05/13/2016   Morbid obesity due to excess calories ([place]) 11/27/2015   Subclinical hypothyroidism 04/25/2013   Type 2 diabetes mellitus ([place]) 07/12/2012   Carpal tunnel syndrome, bilateral 01/25/2012   Diabetic polyneuropathy associated with type 2 diabetes mellitus ([place]) 07/04/2011   Anxiety and depression 06/02/2011   GERD 12/06/2007   Hyperlipidemia 08/29/2007   Hypertension associated with diabetes ([place]) 08/29/2007    ONSET DATE: 02/03/2022   REFERRING DIAG: Z02.585 (ICD-10-CM) - Hx of AKA (above knee amputation), left (HCC)   THERAPY DIAG:  Unsteadiness on feet  Other abnormalities of gait and mobility  Abnormal posture  Muscle weakness (generalized)  Repeated falls  Chronic pain of right knee  Hip stiffness, left  Rationale for Evaluation and Treatment Rehabilitation  SUBJECTIVE:  SUBJECTIVE STATEMENT: Pt reporting high levels of pain in her R knee and back today. Denies falls or other changes. Her home program is going "alright." Reports some changes to medication which were updated accordingly, including increase in pain medication. Patient had dialysis yesterday.   Pt accompanied by: self  PERTINENT HISTORY:  ESRD, HTN, Chronic Pain, s/p left AKA     PAIN:  Are you having pain?  Yes: NPRS scale: 5/10 Pain location: R knee, back Pain description: dull ache   PRECAUTIONS: Fall and Other: On dialysis T/Th/Sat. Fistula in L arm  PATIENT GOALS "I wanna work on walking and maybe strengthening my left knee"   OBJECTIVE:     Vitals:   05/06/22 1325 05/06/22 1327  BP: (!) 94/54 128/63  Pulse: 90 91    TODAY'S TREATMENT:   Ther Ex:  Nustep Lvl 5 x 8 min To increase BP and strengthen LE - BP increased as documented above when rechecked  Forward flexion seated with x-large blue SB to reduce lumbar pain to improve tolerance for functional tasks and as active recovery break x 10 w/ 5" hold   Gait Training: Lateral stepping to improve glute med control 6 x 15' with forearm crutches; cues to decrease forward step compensation (CGA)  2 x 15' without AD and CGA-minA lateral steps with mirror for visual feedback  4 x 15' monster walking working on prosthetic side foot placement with forearm crutch (CGA)  PATIENT EDUCATION: Education details: continue HEP, follow up with primary care about BP, education on BP safety  Person educated: Patient Education method: Explanation, Demonstration, and Handouts Education comprehension: verbalized understanding   HOME EXERCISE PROGRAM: Access Code: [email or web address]  GOALS: Goals reviewed with patient? Yes  LONG TERM GOALS: Target date: 04/15/2022  Pt will be independent with final HEP for improved strength, balance, transfers and gait.  Baseline:  Goal status: IN PROGRESS  2.  Pt will improve 5 x STS to less than or equal to 20 seconds w/BUE support to demonstrate improved functional strength and transfer efficiency.   Baseline: 28.78s w/BUE support; 21.5s w/BUE support 11/29 Goal status: IN PROGRESS  3.  Pt will ambulate >/= 260' on 2MWT w/LRAD mod I for improved cardiovascular endurance and functional mobility  Baseline: 36' w/[place] and S*; 283' w/[place] and S*  Goal status: PARTIALLY MET  4.  Pt will improve  gait velocity to at least 2.5 ft/s w/LRAD mod I for improved gait efficiency   Baseline: 2.34 ft/s w/[place] and CGA Goal status: IN PROGRESS  NEW LONG TERM GOALS FOR UPDATED POC:   Target date: 05/18/2022  Pt will be independent with final HEP for improved strength, balance, transfers and gait. Baseline:  Baseline:  Goal status: IN PROGRESS  2.  Pt will improve 5 x STS to less than or equal to 18 seconds w/BUE support to demonstrate improved functional strength and transfer efficiency.  Baseline: 28.78s w/BUE support; 21.5s w/BUE support 11/29 Goal status: IN PROGRESS  3.  Pt will improve gait velocity to at least 2.5 ft/s w/LRAD mod I for improved gait efficiency  Baseline: 2.34 ft/s w/[place] and CGA Goal status: INITIAL  4.  Pt will improve normal TUG to less than or equal to 18 seconds w/LRAD mod I for improved functional mobility and decreased fall risk.  Baseline: 21.48s w/[place] Goal status: INITIAL  5.  Pt will ambulate 500' on level indoor surfaces w/LRAD mod I for improved endurance and functional mobility  Baseline:  Goal status: INITIAL  ASSESSMENT:  CLINICAL IMPRESSION: Patient arrived to clinic with low blood pressure. Patient provided written values to follow up with PCP. Initiated session on Nustep to elevate BP, which resulted to return to WNL. Patient demonstrated reduced tolerance for prolonged standing activities and required x1 seated rest break with active lumbar flexion stretch in order to progress forward in session. Patient continues to demonstrate reduced weight acceptance onto LLE and will benefit from tasks working on weight acceptance next session. Continue POC as able.    OBJECTIVE IMPAIRMENTS Abnormal gait, decreased activity tolerance, decreased endurance, decreased knowledge of use of DME, decreased mobility, difficulty walking, decreased strength, increased edema, prosthetic dependency , obesity, and pain.   ACTIVITY LIMITATIONS  carrying, lifting, squatting, stairs, transfers, locomotion level, and caring for others  PARTICIPATION LIMITATIONS: meal prep, cleaning, laundry, driving, shopping, community activity, occupation, and yard work  PERSONAL Education officer, community, Past/current experiences, Transportation, and 1-2 comorbidities: dialysis on T/Th/Sat and chronic pain of R knee  are also affecting patient's functional outcome.   REHAB POTENTIAL: Good  CLINICAL DECISION MAKING: Evolving/moderate complexity  EVALUATION COMPLEXITY: Moderate  PLAN: PT FREQUENCY: 2x/week  PT DURATION: 8 weeks + 4 weeks (recert)   PLANNED INTERVENTIONS: Therapeutic exercises, Therapeutic activity, Neuromuscular re-education, Balance training, Gait training, Patient/Family education, Self Care, Joint mobilization, Stair training, Vestibular training, Canalith repositioning, Prosthetic training, DME instructions, Dry Needling, Cryotherapy, Moist heat, Compression bandaging, Taping, Manual therapy, and Re-evaluation  PLAN FOR NEXT SESSION:  continue gait training w/emphasis on L knee flexion w/toe off and midline orientation, sit <> stand with no UE (needed to kidney transplant), SOT machine weight shifting exercise   Esperanza Heir, PT, DPT 05/06/2022, 3:18 PM

## 2022-05-07 DIAGNOSIS — D509 Iron deficiency anemia, unspecified: Secondary | ICD-10-CM | POA: Diagnosis not present

## 2022-05-07 DIAGNOSIS — R197 Diarrhea, unspecified: Secondary | ICD-10-CM | POA: Diagnosis not present

## 2022-05-07 DIAGNOSIS — N186 End stage renal disease: Secondary | ICD-10-CM | POA: Diagnosis not present

## 2022-05-07 DIAGNOSIS — Z992 Dependence on renal dialysis: Secondary | ICD-10-CM | POA: Diagnosis not present

## 2022-05-07 DIAGNOSIS — N2581 Secondary hyperparathyroidism of renal origin: Secondary | ICD-10-CM | POA: Diagnosis not present

## 2022-05-07 DIAGNOSIS — L299 Pruritus, unspecified: Secondary | ICD-10-CM | POA: Diagnosis not present

## 2022-05-07 DIAGNOSIS — D631 Anemia in chronic kidney disease: Secondary | ICD-10-CM | POA: Diagnosis not present

## 2022-05-07 DIAGNOSIS — D689 Coagulation defect, unspecified: Secondary | ICD-10-CM | POA: Diagnosis not present

## 2022-05-07 DIAGNOSIS — R52 Pain, unspecified: Secondary | ICD-10-CM | POA: Diagnosis not present

## 2022-05-09 ENCOUNTER — Ambulatory Visit: Payer: Medicare Other

## 2022-05-09 DIAGNOSIS — R296 Repeated falls: Secondary | ICD-10-CM

## 2022-05-09 DIAGNOSIS — R2689 Other abnormalities of gait and mobility: Secondary | ICD-10-CM | POA: Diagnosis not present

## 2022-05-09 DIAGNOSIS — R293 Abnormal posture: Secondary | ICD-10-CM

## 2022-05-09 DIAGNOSIS — R2681 Unsteadiness on feet: Secondary | ICD-10-CM

## 2022-05-09 DIAGNOSIS — M6281 Muscle weakness (generalized): Secondary | ICD-10-CM | POA: Diagnosis not present

## 2022-05-09 DIAGNOSIS — M25652 Stiffness of left hip, not elsewhere classified: Secondary | ICD-10-CM | POA: Diagnosis not present

## 2022-05-09 DIAGNOSIS — G8929 Other chronic pain: Secondary | ICD-10-CM | POA: Diagnosis not present

## 2022-05-09 DIAGNOSIS — M25561 Pain in right knee: Secondary | ICD-10-CM | POA: Diagnosis not present

## 2022-05-09 NOTE — Therapy (Signed)
OUTPATIENT PHYSICAL THERAPY NEURO TREATMENT   Patient Name: [name] [name] MRN: [id number] DOB:[birth year], 46 y.o., female Today's Date: 05/09/2022  PCP: Riesa Pope, MD REFERRING PROVIDER: Aldine Contes, MD     PT End of Session - 05/09/22 1104     Visit Number 13    Number of Visits 25    Date for PT Re-Evaluation 05/18/22    Authorization Type UHC Medicare/Medicaid of [place]    Progress Note Due on Visit 30    PT Start Time 1100    PT Stop Time 1142    PT Time Calculation (min) 42 min    Equipment Utilized During Treatment Gait belt    Activity Tolerance Patient tolerated treatment well;Patient limited by pain    Behavior During Therapy [facility] for tasks assessed/performed                Past Medical History:  Diagnosis Date   Abdominal muscle pain 09/08/2016   Abnormal Pap smear of cervix 2009   Abscess    history of multiple abscesses   Acute bilateral low back pain 02/13/2017   Acute blood loss anemia    Acute on chronic renal failure ([place]) 07/12/2012   Acute renal failure ([place]) 07/12/2012   Acute respiratory failure with hypoxia ([place]) 10/21/2019   Adjustment disorder with depressed mood 03/26/2017   AKI (acute kidney injury) ([place])    Anemia 10/21/2019   Anemia of chronic disease 2002   Anginal pain (HCC)    Anxiety    Panic attacks   Bilateral lower extremity edema 05/13/2016   Bipolar disorder ([place])    Cataract    B/L cataract   Cellulitis 05/21/2014   right eye   CHF (congestive heart failure) ([place])    Chronic bronchitis ([place])    "get it q yr" (05/13/2013)   Chronic pain    Chronic pain of right knee 09/08/2016   Dehiscence of amputation stump ([place])    Depression    Edema of lower extremity    Endocarditis 2002   subacute bacterial endocarditis.    Fall    Family history of anesthesia complication    "my mom has a hard time coming out from under"   Fibromyalgia    GERD (gastroesophageal reflux disease)    occ   Heart murmur     Herpes simplex type 1 infection 01/16/2018   History of blood transfusion    "just low blood count" (05/13/2013)   Hyperlipidemia    Hypertension    Hypothyroidism    Hypothyroidism, adult 03/21/2014   Hypoxia    Leukocytosis    Muscle spasm 12/16/2020   Necrosis ([place])    and ulceration   Necrotizing fasciitis s/p OR debridements 07/06/2012   Obesity    OSA on CPAP    does not wear all the time   Peripheral neuropathy    Pneumonia    Pyelonephritis 12/31/2020   Routine adult health maintenance 12/08/2016   Severe protein-calorie malnutrition ([place])    Status post below knee amputation, left ([place]) 04/11/2017   Type II diabetes mellitus ([place])    Type  II   Vaginal irritation 03/14/2016   Weak pulse 07/21/2021   Wound dehiscence 09/04/2019   Past Surgical History:  Procedure Laterality Date   A/V FISTULAGRAM Left 05/12/2021   Procedure: A/V FISTULAGRAM;  Surgeon: Broadus John, MD;  Location: [place] [facility];  Service: Cardiovascular;  Laterality: Left;   ABDOMINAL AORTOGRAM W/LOWER EXTREMITY Left 03/19/2019   Procedure: ABDOMINAL AORTOGRAM  W/LOWER EXTREMITY;  Surgeon: Serafina Mitchell, MD;  Location: [place] [facility];  Service: Cardiovascular;  Laterality: Left;   ABOVE KNEE LEG AMPUTATION Left 10/11/2019   wound dehisence    AIR/FLUID EXCHANGE Right 12/31/2019   Procedure: AIR/FLUID EXCHANGE;  Surgeon: Jalene Mullet, MD;  Location: [facility];  Service: Ophthalmology;  Laterality: Right;   AMPUTATION Left 04/07/2017   Procedure: LEFT BELOW KNEE AMPUTATION;  Surgeon: Newt Minion, MD;  Location: [place];  Service: Orthopedics;  Laterality: Left;   AMPUTATION Left 10/11/2019   Procedure: LEFT ABOVE KNEE AMPUTATION;  Surgeon: Newt Minion, MD;  Location: [place];  Service: Orthopedics;  Laterality: Left;   AV FISTULA PLACEMENT Left 01/05/2021   Procedure: ARTERIOVENOUS (AV) FISTULA CREATION vs. GRAFT;  Surgeon: Angelia Mould, MD;  Location: [facility];  Service: Vascular;   Laterality: Left;   CATARACT EXTRACTION W/PHACO Right 02/11/2020   Procedure: CATARACT EXTRACTION PHACO AND INTRAOCULAR LENS PLACEMENT ([place]);  Surgeon: Jalene Mullet, MD;  Location: [place];  Service: Ophthalmology;  Laterality: Right;   EYE SURGERY     lazer   FISTULA SUPERFICIALIZATION Left 02/23/2021   Procedure: SUPERFICIALIZATION OF LEFT BRACHIOCEPHALIC FISTULA;  Surgeon: Angelia Mould, MD;  Location: [place];  Service: Vascular;  Laterality: Left;   GAS INSERTION Right 12/31/2019   Procedure: INSERTION OF GAS;  Surgeon: Jalene Mullet, MD;  Location: [place];  Service: Ophthalmology;  Laterality: Right;   INCISION AND DRAINAGE ABSCESS     multiple I&Ds   INCISION AND DRAINAGE ABSCESS Left 07/09/2012   Procedure: DRESSING CHANGE, THIGH WOUND;  Surgeon: Harl Bowie, MD;  Location: [place];  Service: General;  Laterality: Left;   INCISION AND DRAINAGE OF WOUND Left 07/07/2012   Procedure: IRRIGATION AND DEBRIDEMENT WOUND;  Surgeon: Harl Bowie, MD;  Location: [place];  Service: General;  Laterality: Left;   INCISION AND DRAINAGE PERIRECTAL ABSCESS Left 07/14/2012   Procedure: DEBRIDEMENT OF SKIN & SOFT TISSUE; DRESSING CHANGE UNDER ANESTHESIA;  Surgeon: Gayland Curry, MD,FACS;  Location: [place];  Service: General;  Laterality: Left;   INCISION AND DRAINAGE PERIRECTAL ABSCESS Left 07/16/2012   Procedure: I&D Left Thigh;  Surgeon: Gwenyth Ober, MD;  Location: [place];  Service: General;  Laterality: Left;   INCISION AND DRAINAGE PERIRECTAL ABSCESS N/A 01/05/2015   Procedure: IRRIGATION AND DEBRIDEMENT PERIRECTAL ABSCESS;  Surgeon: Donnie Mesa, MD;  Location: [place];  Service: General;  Laterality: N/A;   INSERTION OF DIALYSIS CATHETER Right 01/05/2021   Procedure: INSERTION OF DIALYSIS CATHETER;  Surgeon: Angelia Mould, MD;  Location: [place];  Service: Vascular;  Laterality: Right;   IRRIGATION AND DEBRIDEMENT ABSCESS Left 07/06/2012   Procedure: IRRIGATION AND  DEBRIDEMENT ABSCESS BUTTOCKS AND THIGH;  Surgeon: Shann Medal, MD;  Location: [place];  Service: General;  Laterality: Left;   IRRIGATION AND DEBRIDEMENT ABSCESS Left 08/10/2012   Procedure: IRRIGATION AND DEBRIDEMENT ABSCESS;  Surgeon: Madilyn Hook, DO;  Location: [place];  Service: General;  Laterality: Left;   PARS PLANA VITRECTOMY Right 12/31/2019   Procedure: PARS PLANA VITRECTOMY WITH 25 GAUGE;  Surgeon: Jalene Mullet, MD;  Location: [place];  Service: Ophthalmology;  Laterality: Right;   PERIPHERAL VASCULAR INTERVENTION Left 05/12/2021   Procedure: PERIPHERAL VASCULAR INTERVENTION;  Surgeon: Broadus John, MD;  Location: [place] [facility];  Service: Cardiovascular;  Laterality: Left;  arm fistula   PHOTOCOAGULATION WITH LASER Right 12/31/2019   Procedure: PHOTOCOAGULATION WITH LASER;  Surgeon: Posey Pronto,  Dareen Piano, MD;  Location: [place];  Service: Ophthalmology;  Laterality: Right;   REPAIR OF COMPLEX TRACTION RETINAL DETACHMENT Right 12/31/2019   Procedure: REPAIR OF COMPLEX TRACTION RETINAL;  Surgeon: Jalene Mullet, MD;  Location: [place];  Service: Ophthalmology;  Laterality: Right;   STUMP REVISION Left 06/09/2017   Procedure: REVISION LEFT BELOW KNEE AMPUTATION;  Surgeon: Newt Minion, MD;  Location: [place];  Service: Orthopedics;  Laterality: Left;   STUMP REVISION Left 09/04/2019   Procedure: LEFT BELOW KNEE AMPUTATION REVISION;  Surgeon: Newt Minion, MD;  Location: [place];  Service: Orthopedics;  Laterality: Left;   STUMP REVISION Left 09/20/2019   Procedure: LEFT BELOW KNEE AMPUTATION REVISION;  Surgeon: Newt Minion, MD;  Location: [place];  Service: Orthopedics;  Laterality: Left;   TONSILLECTOMY  1994   Patient Active Problem List   Diagnosis Date Noted   Urgency of urination 09/12/2021   Hidradenitis suppurativa 02/24/2021   Cough 05/26/2020   Chronic pain syndrome 01/16/2020   Hx of AKA (above knee amputation), left (HCC)    Blurry vision, right eye 10/08/2019    Tobacco use 03/01/2019   Phantom limb pain ([place]) 11/08/2018   Current moderate episode of major depressive disorder (HCC)    Hyperkalemia    Anemia of chronic disease    Bipolar affective disorder ([place])    Heart failure with preserved ejection fraction ([place]) 02/09/2017   Healthcare maintenance 12/08/2016   End-stage renal disease on hemodialysis ([place]) 07/15/2016   OSA (obstructive sleep apnea) 05/13/2016   Morbid obesity due to excess calories ([place]) 11/27/2015   Subclinical hypothyroidism 04/25/2013   Type 2 diabetes mellitus ([place]) 07/12/2012   Carpal tunnel syndrome, bilateral 01/25/2012   Diabetic polyneuropathy associated with type 2 diabetes mellitus ([place]) 07/04/2011   Anxiety and depression 06/02/2011   GERD 12/06/2007   Hyperlipidemia 08/29/2007   Hypertension associated with diabetes ([place]) 08/29/2007    ONSET DATE: 02/03/2022   REFERRING DIAG: E33.295 (ICD-10-CM) - Hx of AKA (above knee amputation), left (HCC)   THERAPY DIAG:  Unsteadiness on feet  Other abnormalities of gait and mobility  Abnormal posture  Muscle weakness (generalized)  Repeated falls  Rationale for Evaluation and Treatment Rehabilitation  SUBJECTIVE:                                                                                                                                                                                              SUBJECTIVE STATEMENT: Patient reports doing well. Has been taking her increased dose of pain rx 2x a day. Reports her "normal pain" today. Has had some near falls on  the sidewalk outside her apt because of a narrow sidewalk. BP 131/66. Patient reports that she feels as though she should be "further than she is now" and wants to "push through the pain."   Pt accompanied by: self  PERTINENT HISTORY:  ESRD, HTN, Chronic Pain, s/p left AKA     PAIN:  Are you having pain? Yes: NPRS scale: 5/10 Pain location: R knee, back Pain description: dull ache    PRECAUTIONS: Fall and Other: On dialysis T/Th/Sat. Fistula in L arm  PATIENT GOALS "I wanna work on walking and maybe strengthening my left knee"     TODAY'S TREATMENT:   Ther Ex:  -Nustep Lvl 5 x 10 min B UE/ LE for improved CV endurance  -lateral stepping over 1x6" hurdle, 4x4" hurdle in // bars with B UE + CGA/MinA  -hooklying bridges 1x12  -LTR 1x12   PATIENT EDUCATION: Education details: continue HEP, importance of appropriate pain management Person educated: Patient Education method: Explanation, Demonstration, and Handouts Education comprehension: verbalized understanding   HOME EXERCISE PROGRAM: Access Code: [email or web address]  GOALS: Goals reviewed with patient? Yes  LONG TERM GOALS: Target date: 04/15/2022  Pt will be independent with final HEP for improved strength, balance, transfers and gait.  Baseline:  Goal status: IN PROGRESS  2.  Pt will improve 5 x STS to less than or equal to 20 seconds w/BUE support to demonstrate improved functional strength and transfer efficiency.   Baseline: 28.78s w/BUE support; 21.5s w/BUE support 11/29 Goal status: IN PROGRESS  3.  Pt will ambulate >/= 260' on 2MWT w/LRAD mod I for improved cardiovascular endurance and functional mobility  Baseline: 48' w/[place] and S*; 283' w/[place] and S*  Goal status: PARTIALLY MET  4.  Pt will improve gait velocity to at least 2.5 ft/s w/LRAD mod I for improved gait efficiency   Baseline: 2.34 ft/s w/[place] and CGA Goal status: IN PROGRESS  NEW LONG TERM GOALS FOR UPDATED POC:   Target date: 05/18/2022  Pt will be independent with final HEP for improved strength, balance, transfers and gait. Baseline:  Baseline:  Goal status: IN PROGRESS  2.  Pt will improve 5 x STS to less than or equal to 18 seconds w/BUE support to demonstrate improved functional strength and transfer efficiency.  Baseline: 28.78s w/BUE support; 21.5s w/BUE support 11/29 Goal status: IN PROGRESS  3.  Pt  will improve gait velocity to at least 2.5 ft/s w/LRAD mod I for improved gait efficiency  Baseline: 2.34 ft/s w/[place] and CGA Goal status: INITIAL  4.  Pt will improve normal TUG to less than or equal to 18 seconds w/LRAD mod I for improved functional mobility and decreased fall risk.  Baseline: 21.48s w/[place] Goal status: INITIAL  5.  Pt will ambulate 500' on level indoor surfaces w/LRAD mod I for improved endurance and functional mobility  Baseline:  Goal status: INITIAL   ASSESSMENT:  CLINICAL IMPRESSION: Patient seen for skilled PT session with emphasis on functional strengthening. Patient voicing frustration that she is not "further than expects to be-" walking without an AD. PT explaining that her progress to no AD is likely limited by her experience of pain. Patient stating that she's able to push through pain for progress, but is sometimes unable to complete her gentle HEP due to increases in pain. Continue POC.    OBJECTIVE IMPAIRMENTS Abnormal gait, decreased activity tolerance, decreased endurance, decreased knowledge of use of DME, decreased mobility, difficulty walking, decreased strength, increased edema, prosthetic dependency , obesity,  and pain.   ACTIVITY LIMITATIONS carrying, lifting, squatting, stairs, transfers, locomotion level, and caring for others  PARTICIPATION LIMITATIONS: meal prep, cleaning, laundry, driving, shopping, community activity, occupation, and yard work  PERSONAL Education officer, community, Past/current experiences, Transportation, and 1-2 comorbidities: dialysis on T/Th/Sat and chronic pain of R knee  are also affecting patient's functional outcome.   REHAB POTENTIAL: Good  CLINICAL DECISION MAKING: Evolving/moderate complexity  EVALUATION COMPLEXITY: Moderate  PLAN: PT FREQUENCY: 2x/week  PT DURATION: 8 weeks + 4 weeks (recert)   PLANNED INTERVENTIONS: Therapeutic exercises, Therapeutic activity, Neuromuscular re-education, Balance  training, Gait training, Patient/Family education, Self Care, Joint mobilization, Stair training, Vestibular training, Canalith repositioning, Prosthetic training, DME instructions, Dry Needling, Cryotherapy, Moist heat, Compression bandaging, Taping, Manual therapy, and Re-evaluation  PLAN FOR NEXT SESSION:  continue gait training w/emphasis on L knee flexion w/toe off and midline orientation, sit <> stand with no UE (needed to kidney transplant), SOT machine weight shifting exercise   Debbora Dus, PT, DPT Debbora Dus, PT, DPT, CBIS  05/09/2022, 11:47 AM

## 2022-05-10 DIAGNOSIS — D689 Coagulation defect, unspecified: Secondary | ICD-10-CM | POA: Diagnosis not present

## 2022-05-10 DIAGNOSIS — L299 Pruritus, unspecified: Secondary | ICD-10-CM | POA: Diagnosis not present

## 2022-05-10 DIAGNOSIS — D509 Iron deficiency anemia, unspecified: Secondary | ICD-10-CM | POA: Diagnosis not present

## 2022-05-10 DIAGNOSIS — R197 Diarrhea, unspecified: Secondary | ICD-10-CM | POA: Diagnosis not present

## 2022-05-10 DIAGNOSIS — Z992 Dependence on renal dialysis: Secondary | ICD-10-CM | POA: Diagnosis not present

## 2022-05-10 DIAGNOSIS — D631 Anemia in chronic kidney disease: Secondary | ICD-10-CM | POA: Diagnosis not present

## 2022-05-10 DIAGNOSIS — N2581 Secondary hyperparathyroidism of renal origin: Secondary | ICD-10-CM | POA: Diagnosis not present

## 2022-05-10 DIAGNOSIS — R52 Pain, unspecified: Secondary | ICD-10-CM | POA: Diagnosis not present

## 2022-05-10 DIAGNOSIS — N186 End stage renal disease: Secondary | ICD-10-CM | POA: Diagnosis not present

## 2022-05-11 ENCOUNTER — Ambulatory Visit: Payer: Medicare Other | Admitting: Physical Therapy

## 2022-05-11 VITALS — BP 138/80 | HR 97

## 2022-05-11 DIAGNOSIS — M6281 Muscle weakness (generalized): Secondary | ICD-10-CM

## 2022-05-11 DIAGNOSIS — M25561 Pain in right knee: Secondary | ICD-10-CM | POA: Diagnosis not present

## 2022-05-11 DIAGNOSIS — R2689 Other abnormalities of gait and mobility: Secondary | ICD-10-CM

## 2022-05-11 DIAGNOSIS — M25652 Stiffness of left hip, not elsewhere classified: Secondary | ICD-10-CM | POA: Diagnosis not present

## 2022-05-11 DIAGNOSIS — R296 Repeated falls: Secondary | ICD-10-CM | POA: Diagnosis not present

## 2022-05-11 DIAGNOSIS — R2681 Unsteadiness on feet: Secondary | ICD-10-CM | POA: Diagnosis not present

## 2022-05-11 DIAGNOSIS — R293 Abnormal posture: Secondary | ICD-10-CM | POA: Diagnosis not present

## 2022-05-11 DIAGNOSIS — G8929 Other chronic pain: Secondary | ICD-10-CM | POA: Diagnosis not present

## 2022-05-11 NOTE — Therapy (Signed)
OUTPATIENT PHYSICAL THERAPY NEURO TREATMENT   Patient Name: [name] [name] MRN: [id number] DOB:[birth year], 46 y.o., female Today's Date: 05/11/2022  PCP: Riesa Pope, MD REFERRING PROVIDER: Aldine Contes, MD     PT End of Session - 05/11/22 1020     Visit Number 14    Number of Visits 25    Date for PT Re-Evaluation 05/18/22    Authorization Type [employer]HC Medicare/Medicaid of [place]    Progress Note Due on Visit 30    PT Start Time 1018    PT Stop Time 1100    PT Time Calculation (min) 42 min    Equipment Utilized During Treatment Gait belt    Activity Tolerance Patient tolerated treatment well    Behavior During Therapy WFL for tasks assessed/performed                 Past Medical History:  Diagnosis Date   Abdominal muscle pain 09/08/2016   Abnormal Pap smear of cervix 2009   Abscess    history of multiple abscesses   Acute bilateral low back pain 02/13/2017   Acute blood loss anemia    Acute on chronic renal failure ([place]) 07/12/2012   Acute renal failure ([place]) 07/12/2012   Acute respiratory failure with hypoxia ([place]) 10/21/2019   Adjustment disorder with depressed mood 03/26/2017   AKI (acute kidney injury) ([place])    Anemia 10/21/2019   Anemia of chronic disease 2002   Anginal pain (HCC)    Anxiety    Panic attacks   Bilateral lower extremity edema 05/13/2016   Bipolar disorder ([place])    Cataract    B/L cataract   Cellulitis 05/21/2014   right eye   CHF (congestive heart failure) ([place])    Chronic bronchitis ([place])    "get it q yr" (05/13/2013)   Chronic pain    Chronic pain of right knee 09/08/2016   Dehiscence of amputation stump ([place])    Depression    Edema of lower extremity    Endocarditis 2002   subacute bacterial endocarditis.    Fall    Family history of anesthesia complication    "my mom has a hard time coming out from under"   Fibromyalgia    GERD (gastroesophageal reflux disease)    occ   Heart murmur    Herpes simplex type  1 infection 01/16/2018   History of blood transfusion    "just low blood count" (05/13/2013)   Hyperlipidemia    Hypertension    Hypothyroidism    Hypothyroidism, adult 03/21/2014   Hypoxia    Leukocytosis    Muscle spasm 12/16/2020   Necrosis ([place])    and ulceration   Necrotizing fasciitis s/p OR debridements 07/06/2012   Obesity    OSA on CPAP    does not wear all the time   Peripheral neuropathy    Pneumonia    Pyelonephritis 12/31/2020   Routine adult health maintenance 12/08/2016   Severe protein-calorie malnutrition ([place])    Status post below knee amputation, left ([place]) 04/11/2017   Type II diabetes mellitus ([place])    Type  II   Vaginal irritation 03/14/2016   Weak pulse 07/21/2021   Wound dehiscence 09/04/2019   Past Surgical History:  Procedure Laterality Date   A/V FISTULAGRAM Left 05/12/2021   Procedure: A/V FISTULAGRAM;  Surgeon: Broadus John, MD;  Location: [facility];  Service: Cardiovascular;  Laterality: Left;   ABDOMINAL AORTOGRAM W/LOWER EXTREMITY Left 03/19/2019   Procedure: ABDOMINAL AORTOGRAM W/LOWER EXTREMITY;  Surgeon: Serafina Mitchell, MD;  Location: [facility];  Service: Cardiovascular;  Laterality: Left;   ABOVE KNEE LEG AMPUTATION Left 10/11/2019   wound dehisence    AIR/FLUID EXCHANGE Right 12/31/2019   Procedure: AIR/FLUID EXCHANGE;  Surgeon: Jalene Mullet, MD;  Location: [facility];  Service: Ophthalmology;  Laterality: Right;   AMPUTATION Left 04/07/2017   Procedure: LEFT BELOW KNEE AMPUTATION;  Surgeon: Newt Minion, MD;  Location: [facility];  Service: Orthopedics;  Laterality: Left;   AMPUTATION Left 10/11/2019   Procedure: LEFT ABOVE KNEE AMPUTATION;  Surgeon: Newt Minion, MD;  Location: [facility];  Service: Orthopedics;  Laterality: Left;   AV FISTULA PLACEMENT Left 01/05/2021   Procedure: ARTERIOVENOUS (AV) FISTULA CREATION vs. GRAFT;  Surgeon: Angelia Mould, MD;  Location: [facility];  Service: Vascular;  Laterality: Left;    CATARACT EXTRACTION W/PHACO Right 02/11/2020   Procedure: CATARACT EXTRACTION PHACO AND INTRAOCULAR LENS PLACEMENT ([place]);  Surgeon: Jalene Mullet, MD;  Location: [place];  Service: Ophthalmology;  Laterality: Right;   EYE SURGERY     lazer   FISTULA SUPERFICIALIZATION Left 02/23/2021   Procedure: SUPERFICIALIZATION OF LEFT BRACHIOCEPHALIC FISTULA;  Surgeon: Angelia Mould, MD;  Location: [facility];  Service: Vascular;  Laterality: Left;   GAS INSERTION Right 12/31/2019   Procedure: INSERTION OF GAS;  Surgeon: Jalene Mullet, MD;  Location: [facility];  Service: Ophthalmology;  Laterality: Right;   INCISION AND DRAINAGE ABSCESS     multiple I&Ds   INCISION AND DRAINAGE ABSCESS Left 07/09/2012   Procedure: DRESSING CHANGE, THIGH WOUND;  Surgeon: Harl Bowie, MD;  Location: [facility];  Service: General;  Laterality: Left;   INCISION AND DRAINAGE OF WOUND Left 07/07/2012   Procedure: IRRIGATION AND DEBRIDEMENT WOUND;  Surgeon: Harl Bowie, MD;  Location: [facility];  Service: General;  Laterality: Left;   INCISION AND DRAINAGE PERIRECTAL ABSCESS Left 07/14/2012   Procedure: DEBRIDEMENT OF SKIN & SOFT TISSUE; DRESSING CHANGE UNDER ANESTHESIA;  Surgeon: Gayland Curry, MD,FACS;  Location: [facility];  Service: General;  Laterality: Left;   INCISION AND DRAINAGE PERIRECTAL ABSCESS Left 07/16/2012   Procedure: I&D Left Thigh;  Surgeon: Gwenyth Ober, MD;  Location: [facility];  Service: General;  Laterality: Left;   INCISION AND DRAINAGE PERIRECTAL ABSCESS N/A 01/05/2015   Procedure: IRRIGATION AND DEBRIDEMENT PERIRECTAL ABSCESS;  Surgeon: Donnie Mesa, MD;  Location: [facility];  Service: General;  Laterality: N/A;   INSERTION OF DIALYSIS CATHETER Right 01/05/2021   Procedure: INSERTION OF DIALYSIS CATHETER;  Surgeon: Angelia Mould, MD;  Location: [facility];  Service: Vascular;  Laterality: Right;   IRRIGATION AND DEBRIDEMENT ABSCESS Left 07/06/2012   Procedure: IRRIGATION AND DEBRIDEMENT ABSCESS BUTTOCKS  AND THIGH;  Surgeon: Shann Medal, MD;  Location: [facility];  Service: General;  Laterality: Left;   IRRIGATION AND DEBRIDEMENT ABSCESS Left 08/10/2012   Procedure: IRRIGATION AND DEBRIDEMENT ABSCESS;  Surgeon: Madilyn Hook, DO;  Location: [facility];  Service: General;  Laterality: Left;   PARS PLANA VITRECTOMY Right 12/31/2019   Procedure: PARS PLANA VITRECTOMY WITH 25 GAUGE;  Surgeon: Jalene Mullet, MD;  Location: [facility];  Service: Ophthalmology;  Laterality: Right;   PERIPHERAL VASCULAR INTERVENTION Left 05/12/2021   Procedure: PERIPHERAL VASCULAR INTERVENTION;  Surgeon: Broadus John, MD;  Location: [facility];  Service: Cardiovascular;  Laterality: Left;  arm fistula   PHOTOCOAGULATION WITH LASER Right 12/31/2019   Procedure: PHOTOCOAGULATION WITH LASER;  Surgeon: Jalene Mullet, MD;  Location: [facility];  Service: Ophthalmology;  Laterality: Right;   REPAIR OF COMPLEX TRACTION RETINAL DETACHMENT Right 12/31/2019   Procedure: REPAIR OF COMPLEX TRACTION RETINAL;  Surgeon: Jalene Mullet, MD;  Location: [facility];  Service: Ophthalmology;  Laterality: Right;   STUMP REVISION Left 06/09/2017   Procedure: REVISION LEFT BELOW KNEE AMPUTATION;  Surgeon: Newt Minion, MD;  Location: [facility];  Service: Orthopedics;  Laterality: Left;   STUMP REVISION Left 09/04/2019   Procedure: LEFT BELOW KNEE AMPUTATION REVISION;  Surgeon: Newt Minion, MD;  Location: [facility];  Service: Orthopedics;  Laterality: Left;   STUMP REVISION Left 09/20/2019   Procedure: LEFT BELOW KNEE AMPUTATION REVISION;  Surgeon: Newt Minion, MD;  Location: [facility];  Service: Orthopedics;  Laterality: Left;   TONSILLECTOMY  1994   Patient Active Problem List   Diagnosis Date Noted   Urgency of urination 09/12/2021   Hidradenitis suppurativa 02/24/2021   Cough 05/26/2020   Chronic pain syndrome 01/16/2020   Hx of AKA (above knee amputation), left (HCC)    Blurry vision, right eye 10/08/2019   Tobacco use 03/01/2019   Phantom  limb pain ([place]) 11/08/2018   Current moderate episode of major depressive disorder (HCC)    Hyperkalemia    Anemia of chronic disease    Bipolar affective disorder ([place])    Heart failure with preserved ejection fraction ([place]) 02/09/2017   Healthcare maintenance 12/08/2016   End-stage renal disease on hemodialysis ([place]) 07/15/2016   OSA (obstructive sleep apnea) 05/13/2016   Morbid obesity due to excess calories ([place]) 11/27/2015   Subclinical hypothyroidism 04/25/2013   Type 2 diabetes mellitus ([place]) 07/12/2012   Carpal tunnel syndrome, bilateral 01/25/2012   Diabetic polyneuropathy associated with type 2 diabetes mellitus ([place]) 07/04/2011   Anxiety and depression 06/02/2011   GERD 12/06/2007   Hyperlipidemia 08/29/2007   Hypertension associated with diabetes ([place]) 08/29/2007    ONSET DATE: 02/03/2022   REFERRING DIAG: V67.209 (ICD-10-CM) - Hx of AKA (above knee amputation), left (HCC)   THERAPY DIAG:  Unsteadiness on feet  Other abnormalities of gait and mobility  Muscle weakness (generalized)  Rationale for Evaluation and Treatment Rehabilitation  SUBJECTIVE:                                                                                                                                                                                              SUBJECTIVE STATEMENT: Patient reports doing well. Not having as much pain today and did not take pain meds prior to session. Is working on her HEP, cannot remember all her standing exercises. No falls   Pt accompanied  by: self  PERTINENT HISTORY:  ESRD, HTN, Chronic Pain, s/p left AKA     PAIN:  Are you having pain? Yes: NPRS scale: 5/10 Pain location: R knee, back Pain description: dull ache   VITALS Today's Vitals   05/11/22 1023  BP: 138/80  Pulse: 97    PRECAUTIONS: Fall and Other: On dialysis T/Th/Sat. Fistula in L arm  PATIENT GOALS "I wanna work on walking and maybe strengthening my left knee"     TODAY'S  TREATMENT:  NMR  Provided pt with handout of HEP in standing position and verbally reviewed each with pt.  In // bars for improved gait kinematics, lateral weight shifting and BLE strength: Fwd and retro gait without UE support using mirror for visual feedback, 2x3 in each direction. Noted significant circumduction of LLE in fwd direction initially, provided mod verbal cues for increased step length and facilitation of TO on L side to unlock L knee. Pt able to improve step length by increasing speed and reduce circumduction significantly. Pt also reported decreased R knee pain when taking longer steps. CGA throughout for safety  Side stepping w/BUE support x5 in each direction w/emphasis of proper unlocking of L knee without hip hike compensation. Pt able to unlock knee well w/abduction but had increased difficulty w/adduction. W/added practice pt able to perform well and continued to report low levels of pain in R knee    PATIENT EDUCATION: Education details: continue HEP, practicing unlocking L knee and equal weightbearing w/gait training to reduce load on R knee Person educated: Patient Education method: Explanation, Demonstration, and Handouts Education comprehension: verbalized understanding   HOME EXERCISE PROGRAM: Access Code: [email or web address]  GOALS: Goals reviewed with patient? Yes  LONG TERM GOALS: Target date: 04/15/2022  Pt will be independent with final HEP for improved strength, balance, transfers and gait.  Baseline:  Goal status: IN PROGRESS  2.  Pt will improve 5 x STS to less than or equal to 20 seconds w/BUE support to demonstrate improved functional strength and transfer efficiency.   Baseline: 28.78s w/BUE support; 21.5s w/BUE support 11/29 Goal status: IN PROGRESS  3.  Pt will ambulate >/= 260' on 2MWT w/LRAD mod I for improved cardiovascular endurance and functional mobility  Baseline: 63' w/[place] and S*; 283' w/[place] and S*  Goal status: PARTIALLY MET  4.   Pt will improve gait velocity to at least 2.5 ft/s w/LRAD mod I for improved gait efficiency   Baseline: 2.34 ft/s w/[place] and CGA Goal status: IN PROGRESS  NEW LONG TERM GOALS FOR UPDATED POC:   Target date: 05/18/2022  Pt will be independent with final HEP for improved strength, balance, transfers and gait. Baseline:  Baseline:  Goal status: IN PROGRESS  2.  Pt will improve 5 x STS to less than or equal to 18 seconds w/BUE support to demonstrate improved functional strength and transfer efficiency.  Baseline: 28.78s w/BUE support; 21.5s w/BUE support 11/29 Goal status: IN PROGRESS  3.  Pt will improve gait velocity to at least 2.5 ft/s w/LRAD mod I for improved gait efficiency  Baseline: 2.34 ft/s w/[place] and CGA Goal status: INITIAL  4.  Pt will improve normal TUG to less than or equal to 18 seconds w/LRAD mod I for improved functional mobility and decreased fall risk.  Baseline: 21.48s w/[place] Goal status: INITIAL  5.  Pt will ambulate 500' on level indoor surfaces w/LRAD mod I for improved endurance and functional mobility  Baseline:  Goal status: INITIAL   ASSESSMENT:  CLINICAL IMPRESSION: Emphasis of skilled PT session on gait training without UE support and proper unlocking of L knee. Pt continues to rely heavily on right lateral lean and L hip hike to progress L leg, placing heavy valgus strain on R knee. W/cues for increased step length w/RLE, pt able to complete proper TO on LLE and unlock knee, resulting in more upright posture and decreased strain on R knee. Continue POC.    OBJECTIVE IMPAIRMENTS Abnormal gait, decreased activity tolerance, decreased endurance, decreased knowledge of use of DME, decreased mobility, difficulty walking, decreased strength, increased edema, prosthetic dependency , obesity, and pain.   ACTIVITY LIMITATIONS carrying, lifting, squatting, stairs, transfers, locomotion level, and caring for others  PARTICIPATION LIMITATIONS:  meal prep, cleaning, laundry, driving, shopping, community activity, occupation, and yard work  PERSONAL Education officer, community, Past/current experiences, Transportation, and 1-2 comorbidities: dialysis on T/Th/Sat and chronic pain of R knee  are also affecting patient's functional outcome.   REHAB POTENTIAL: Good  CLINICAL DECISION MAKING: Evolving/moderate complexity  EVALUATION COMPLEXITY: Moderate  PLAN: PT FREQUENCY: 2x/week  PT DURATION: 8 weeks + 4 weeks (recert)   PLANNED INTERVENTIONS: Therapeutic exercises, Therapeutic activity, Neuromuscular re-education, Balance training, Gait training, Patient/Family education, Self Care, Joint mobilization, Stair training, Vestibular training, Canalith repositioning, Prosthetic training, DME instructions, Dry Needling, Cryotherapy, Moist heat, Compression bandaging, Taping, Manual therapy, and Re-evaluation  PLAN FOR NEXT SESSION:  continue gait training w/emphasis on L knee flexion w/toe off and midline orientation, sit <> stand with no UE (needed to kidney transplant), SOT machine weight shifting exercise    E , PT, DPT 05/11/2022, 11:01 AM

## 2022-05-12 DIAGNOSIS — D631 Anemia in chronic kidney disease: Secondary | ICD-10-CM | POA: Diagnosis not present

## 2022-05-12 DIAGNOSIS — R52 Pain, unspecified: Secondary | ICD-10-CM | POA: Diagnosis not present

## 2022-05-12 DIAGNOSIS — D689 Coagulation defect, unspecified: Secondary | ICD-10-CM | POA: Diagnosis not present

## 2022-05-12 DIAGNOSIS — N2581 Secondary hyperparathyroidism of renal origin: Secondary | ICD-10-CM | POA: Diagnosis not present

## 2022-05-12 DIAGNOSIS — D509 Iron deficiency anemia, unspecified: Secondary | ICD-10-CM | POA: Diagnosis not present

## 2022-05-12 DIAGNOSIS — Z992 Dependence on renal dialysis: Secondary | ICD-10-CM | POA: Diagnosis not present

## 2022-05-12 DIAGNOSIS — R197 Diarrhea, unspecified: Secondary | ICD-10-CM | POA: Diagnosis not present

## 2022-05-12 DIAGNOSIS — N186 End stage renal disease: Secondary | ICD-10-CM | POA: Diagnosis not present

## 2022-05-12 DIAGNOSIS — L299 Pruritus, unspecified: Secondary | ICD-10-CM | POA: Diagnosis not present

## 2022-05-14 DIAGNOSIS — D509 Iron deficiency anemia, unspecified: Secondary | ICD-10-CM | POA: Diagnosis not present

## 2022-05-14 DIAGNOSIS — D631 Anemia in chronic kidney disease: Secondary | ICD-10-CM | POA: Diagnosis not present

## 2022-05-14 DIAGNOSIS — N2581 Secondary hyperparathyroidism of renal origin: Secondary | ICD-10-CM | POA: Diagnosis not present

## 2022-05-14 DIAGNOSIS — R52 Pain, unspecified: Secondary | ICD-10-CM | POA: Diagnosis not present

## 2022-05-14 DIAGNOSIS — L299 Pruritus, unspecified: Secondary | ICD-10-CM | POA: Diagnosis not present

## 2022-05-14 DIAGNOSIS — R197 Diarrhea, unspecified: Secondary | ICD-10-CM | POA: Diagnosis not present

## 2022-05-14 DIAGNOSIS — Z992 Dependence on renal dialysis: Secondary | ICD-10-CM | POA: Diagnosis not present

## 2022-05-14 DIAGNOSIS — D689 Coagulation defect, unspecified: Secondary | ICD-10-CM | POA: Diagnosis not present

## 2022-05-14 DIAGNOSIS — N186 End stage renal disease: Secondary | ICD-10-CM | POA: Diagnosis not present

## 2022-05-17 ENCOUNTER — Other Ambulatory Visit: Payer: Self-pay | Admitting: Student

## 2022-05-17 DIAGNOSIS — N2581 Secondary hyperparathyroidism of renal origin: Secondary | ICD-10-CM | POA: Diagnosis not present

## 2022-05-17 DIAGNOSIS — L299 Pruritus, unspecified: Secondary | ICD-10-CM | POA: Diagnosis not present

## 2022-05-17 DIAGNOSIS — D509 Iron deficiency anemia, unspecified: Secondary | ICD-10-CM | POA: Diagnosis not present

## 2022-05-17 DIAGNOSIS — R197 Diarrhea, unspecified: Secondary | ICD-10-CM | POA: Diagnosis not present

## 2022-05-17 DIAGNOSIS — D631 Anemia in chronic kidney disease: Secondary | ICD-10-CM | POA: Diagnosis not present

## 2022-05-17 DIAGNOSIS — Z992 Dependence on renal dialysis: Secondary | ICD-10-CM | POA: Diagnosis not present

## 2022-05-17 DIAGNOSIS — D689 Coagulation defect, unspecified: Secondary | ICD-10-CM | POA: Diagnosis not present

## 2022-05-17 DIAGNOSIS — R52 Pain, unspecified: Secondary | ICD-10-CM | POA: Diagnosis not present

## 2022-05-17 DIAGNOSIS — N186 End stage renal disease: Secondary | ICD-10-CM | POA: Diagnosis not present

## 2022-05-18 ENCOUNTER — Ambulatory Visit: Payer: Medicare Other | Admitting: Physical Therapy

## 2022-05-18 DIAGNOSIS — R2689 Other abnormalities of gait and mobility: Secondary | ICD-10-CM

## 2022-05-18 DIAGNOSIS — M6281 Muscle weakness (generalized): Secondary | ICD-10-CM | POA: Diagnosis not present

## 2022-05-18 DIAGNOSIS — R296 Repeated falls: Secondary | ICD-10-CM | POA: Diagnosis not present

## 2022-05-18 DIAGNOSIS — R293 Abnormal posture: Secondary | ICD-10-CM | POA: Diagnosis not present

## 2022-05-18 DIAGNOSIS — R2681 Unsteadiness on feet: Secondary | ICD-10-CM

## 2022-05-18 DIAGNOSIS — M25652 Stiffness of left hip, not elsewhere classified: Secondary | ICD-10-CM | POA: Diagnosis not present

## 2022-05-18 DIAGNOSIS — M25561 Pain in right knee: Secondary | ICD-10-CM | POA: Diagnosis not present

## 2022-05-18 DIAGNOSIS — G8929 Other chronic pain: Secondary | ICD-10-CM | POA: Diagnosis not present

## 2022-05-18 NOTE — Therapy (Signed)
OUTPATIENT PHYSICAL THERAPY NEURO TREATMENT- DISCHARGE SUMMARY   Patient Name: [name] [name] MRN: [id number] DOB:07/01/1975, 46 y.o., female Today's Date: 05/18/2022  PCP: Riesa Pope, MD REFERRING PROVIDER: Aldine Contes, MD   PHYSICAL THERAPY DISCHARGE SUMMARY  Visits from Start of Care: 15  Current functional level related to goals / functional outcomes: Pt requires Lofstrand for all mobility at a Mod I-S* level depending on fatigue    Remaining deficits: Decreased activity tolerance, R knee pain (meniscus tear), impaired gait kinematics due to global weakness and AKA.    Education / Equipment: HEP   Patient agrees to discharge. Patient goals were partially met. Patient is being discharged due to being pleased with the current functional level.     PT End of Session - 05/18/22 0934     Visit Number 15    Number of Visits 25    Date for PT Re-Evaluation 05/18/22    Authorization Type UHC Medicare/Medicaid of [place]    Progress Note Due on Visit 30    PT Start Time 0932    PT Stop Time 1002   DC   PT Time Calculation (min) 30 min    Equipment Utilized During Treatment Gait belt    Activity Tolerance Patient tolerated treatment well    Behavior During Therapy WFL for tasks assessed/performed                  Past Medical History:  Diagnosis Date   Abdominal muscle pain 09/08/2016   Abnormal Pap smear of cervix 2009   Abscess    history of multiple abscesses   Acute bilateral low back pain 02/13/2017   Acute blood loss anemia    Acute on chronic renal failure ([place]) 07/12/2012   Acute renal failure ([place]) 07/12/2012   Acute respiratory failure with hypoxia ([place]) 10/21/2019   Adjustment disorder with depressed mood 03/26/2017   AKI (acute kidney injury) ([place])    Anemia 10/21/2019   Anemia of chronic disease 2002   Anginal pain (HCC)    Anxiety    Panic attacks   Bilateral lower extremity edema 05/13/2016   Bipolar disorder ([place])     Cataract    B/L cataract   Cellulitis 05/21/2014   right eye   CHF (congestive heart failure) ([place])    Chronic bronchitis ([place])    "get it q yr" (05/13/2013)   Chronic pain    Chronic pain of right knee 09/08/2016   Dehiscence of amputation stump ([place])    Depression    Edema of lower extremity    Endocarditis 2002   subacute bacterial endocarditis.    Fall    Family history of anesthesia complication    "my mom has a hard time coming out from under"   Fibromyalgia    GERD (gastroesophageal reflux disease)    occ   Heart murmur    Herpes simplex type 1 infection 01/16/2018   History of blood transfusion    "just low blood count" (05/13/2013)   Hyperlipidemia    Hypertension    Hypothyroidism    Hypothyroidism, adult 03/21/2014   Hypoxia    Leukocytosis    Muscle spasm 12/16/2020   Necrosis ([place])    and ulceration   Necrotizing fasciitis s/p OR debridements 07/06/2012   Obesity    OSA on CPAP    does not wear all the time   Peripheral neuropathy    Pneumonia    Pyelonephritis 12/31/2020   Routine adult health maintenance 12/08/2016   Severe  protein-calorie malnutrition ([place])    Status post below knee amputation, left ([place]) 04/11/2017   Type II diabetes mellitus ([place])    Type  II   Vaginal irritation 03/14/2016   Weak pulse 07/21/2021   Wound dehiscence 09/04/2019   Past Surgical History:  Procedure Laterality Date   A/V FISTULAGRAM Left 05/12/2021   Procedure: A/V FISTULAGRAM;  Surgeon: Broadus John, MD;  Location: [facility];  Service: Cardiovascular;  Laterality: Left;   ABDOMINAL AORTOGRAM W/LOWER EXTREMITY Left 03/19/2019   Procedure: ABDOMINAL AORTOGRAM W/LOWER EXTREMITY;  Surgeon: Serafina Mitchell, MD;  Location: [facility];  Service: Cardiovascular;  Laterality: Left;   ABOVE KNEE LEG AMPUTATION Left 10/11/2019   wound dehisence    AIR/FLUID EXCHANGE Right 12/31/2019   Procedure: AIR/FLUID EXCHANGE;  Surgeon: Jalene Mullet, MD;   Location: [facility];  Service: Ophthalmology;  Laterality: Right;   AMPUTATION Left 04/07/2017   Procedure: LEFT BELOW KNEE AMPUTATION;  Surgeon: Newt Minion, MD;  Location: [facility];  Service: Orthopedics;  Laterality: Left;   AMPUTATION Left 10/11/2019   Procedure: LEFT ABOVE KNEE AMPUTATION;  Surgeon: Newt Minion, MD;  Location: [facility];  Service: Orthopedics;  Laterality: Left;   AV FISTULA PLACEMENT Left 01/05/2021   Procedure: ARTERIOVENOUS (AV) FISTULA CREATION vs. GRAFT;  Surgeon: Angelia Mould, MD;  Location: [facility];  Service: Vascular;  Laterality: Left;   CATARACT EXTRACTION W/PHACO Right 02/11/2020   Procedure: CATARACT EXTRACTION PHACO AND INTRAOCULAR LENS PLACEMENT ([place]);  Surgeon: Jalene Mullet, MD;  Location: [facility];  Service: Ophthalmology;  Laterality: Right;   EYE SURGERY     lazer   FISTULA SUPERFICIALIZATION Left 02/23/2021   Procedure: SUPERFICIALIZATION OF LEFT BRACHIOCEPHALIC FISTULA;  Surgeon: Angelia Mould, MD;  Location: [facility];  Service: Vascular;  Laterality: Left;   GAS INSERTION Right 12/31/2019   Procedure: INSERTION OF GAS;  Surgeon: Jalene Mullet, MD;  Location: [facility];  Service: Ophthalmology;  Laterality: Right;   INCISION AND DRAINAGE ABSCESS     multiple I&Ds   INCISION AND DRAINAGE ABSCESS Left 07/09/2012   Procedure: DRESSING CHANGE, THIGH WOUND;  Surgeon: Harl Bowie, MD;  Location: [facility];  Service: General;  Laterality: Left;   INCISION AND DRAINAGE OF WOUND Left 07/07/2012   Procedure: IRRIGATION AND DEBRIDEMENT WOUND;  Surgeon: Harl Bowie, MD;  Location: [facility];  Service: General;  Laterality: Left;   INCISION AND DRAINAGE PERIRECTAL ABSCESS Left 07/14/2012   Procedure: DEBRIDEMENT OF SKIN & SOFT TISSUE; DRESSING CHANGE UNDER ANESTHESIA;  Surgeon: Gayland Curry, MD,FACS;  Location: [facility];  Service: General;  Laterality: Left;   INCISION AND DRAINAGE PERIRECTAL ABSCESS Left 07/16/2012   Procedure: I&D Left Thigh;  Surgeon:  Gwenyth Ober, MD;  Location: [facility];  Service: General;  Laterality: Left;   INCISION AND DRAINAGE PERIRECTAL ABSCESS N/A 01/05/2015   Procedure: IRRIGATION AND DEBRIDEMENT PERIRECTAL ABSCESS;  Surgeon: Donnie Mesa, MD;  Location: [place];  Service: General;  Laterality: N/A;   INSERTION OF DIALYSIS CATHETER Right 01/05/2021   Procedure: INSERTION OF DIALYSIS CATHETER;  Surgeon: Angelia Mould, MD;  Location: [place];  Service: Vascular;  Laterality: Right;   IRRIGATION AND DEBRIDEMENT ABSCESS Left 07/06/2012   Procedure: IRRIGATION AND DEBRIDEMENT ABSCESS BUTTOCKS AND THIGH;  Surgeon: Shann Medal, MD;  Location: [facility];  Service: General;  Laterality: Left;   IRRIGATION AND DEBRIDEMENT ABSCESS Left 08/10/2012   Procedure: IRRIGATION AND DEBRIDEMENT ABSCESS;  Surgeon: Aaron Edelman  Lilyan Punt, DO;  Location: [facility];  Service: General;  Laterality: Left;   PARS PLANA VITRECTOMY Right 12/31/2019   Procedure: PARS PLANA VITRECTOMY WITH 25 GAUGE;  Surgeon: Jalene Mullet, MD;  Location: [facility];  Service: Ophthalmology;  Laterality: Right;   PERIPHERAL VASCULAR INTERVENTION Left 05/12/2021   Procedure: PERIPHERAL VASCULAR INTERVENTION;  Surgeon: Broadus John, MD;  Location: [facility];  Service: Cardiovascular;  Laterality: Left;  arm fistula   PHOTOCOAGULATION WITH LASER Right 12/31/2019   Procedure: PHOTOCOAGULATION WITH LASER;  Surgeon: Jalene Mullet, MD;  Location: [facility];  Service: Ophthalmology;  Laterality: Right;   REPAIR OF COMPLEX TRACTION RETINAL DETACHMENT Right 12/31/2019   Procedure: REPAIR OF COMPLEX TRACTION RETINAL;  Surgeon: Jalene Mullet, MD;  Location: [facility];  Service: Ophthalmology;  Laterality: Right;   STUMP REVISION Left 06/09/2017   Procedure: REVISION LEFT BELOW KNEE AMPUTATION;  Surgeon: Newt Minion, MD;  Location: [facility];  Service: Orthopedics;  Laterality: Left;   STUMP REVISION Left 09/04/2019   Procedure: LEFT BELOW KNEE AMPUTATION REVISION;  Surgeon: Newt Minion, MD;  Location: [facility];  Service: Orthopedics;  Laterality: Left;   STUMP REVISION Left 09/20/2019   Procedure: LEFT BELOW KNEE AMPUTATION REVISION;  Surgeon: Newt Minion, MD;  Location: [facility];  Service: Orthopedics;  Laterality: Left;   TONSILLECTOMY  1994   Patient Active Problem List   Diagnosis Date Noted   Urgency of urination 09/12/2021   Hidradenitis suppurativa 02/24/2021   Cough 05/26/2020   Chronic pain syndrome 01/16/2020   Hx of AKA (above knee amputation), left (HCC)    Blurry vision, right eye 10/08/2019   Tobacco use 03/01/2019   Phantom limb pain ([place]) 11/08/2018   Current moderate episode of major depressive disorder (HCC)    Hyperkalemia    Anemia of chronic disease    Bipolar affective disorder ([place])    Heart failure with preserved ejection fraction ([place]) 02/09/2017   Healthcare maintenance 12/08/2016   End-stage renal disease on hemodialysis ([place]) 07/15/2016   OSA (obstructive sleep apnea) 05/13/2016   Morbid obesity due to excess calories ([place]) 11/27/2015   Subclinical hypothyroidism 04/25/2013   Type 2 diabetes mellitus ([place]) 07/12/2012   Carpal tunnel syndrome, bilateral 01/25/2012   Diabetic polyneuropathy associated with type 2 diabetes mellitus ([place]) 07/04/2011   Anxiety and depression 06/02/2011   GERD 12/06/2007   Hyperlipidemia 08/29/2007   Hypertension associated with diabetes ([place]) 08/29/2007    ONSET DATE: 02/03/2022   REFERRING DIAG: X44.818 (ICD-10-CM) - Hx of AKA (above knee amputation), left (HCC)   THERAPY DIAG:  Unsteadiness on feet  Other abnormalities of gait and mobility  Muscle weakness (generalized)  Rationale for Evaluation and Treatment Rehabilitation  SUBJECTIVE:  SUBJECTIVE STATEMENT: Patient reports having mild R knee pain  today, did not take medicine this morning. Pt reports her LLE is "squeaking", started this morning. No falls, HEP is going "okay"   Pt accompanied by: self  PERTINENT HISTORY:  ESRD, HTN, Chronic Pain, s/p left AKA     PAIN:  Are you having pain? Yes: NPRS scale: 3/10 Pain location: R knee, back Pain description: dull ache   VITALS There were no vitals filed for this visit.   PRECAUTIONS: Fall and Other: On dialysis T/Th/Sat. Fistula in L arm  PATIENT GOALS "I wanna work on walking and maybe strengthening my left knee"     TODAY'S TREATMENT:  Ther Act LTG Assessment     [facility] PT Assessment - 05/18/22 0950       Transfers   Five time sit to stand comments  25.16s w/BUE support   Increased difficulty getting L knee to flex     Ambulation/Gait   Gait velocity 32.8' over 13.37s = 2.12f/s w/Lofstrand      Timed Up and Go Test   Normal TUG (seconds) 26.81   w.Lofstrand           Gait pattern: step through pattern, decreased step length- Left, decreased stride length, decreased hip/knee flexion- Left, circumduction- Left, Left hip hike, antalgic, lateral lean- Right, decreased trunk rotation, wide BOS, and poor foot clearance- Left Distance walked: 460' plus various clinic distances  Assistive device utilized:  Lofstrand Level of assistance: Modified independence and SBA Comments: Pt w/increased difficulty clearing L foot today due to decreased knee flexion. Pt able to ambulate 460' but noted increased R lateral lean and circumduction of LL as pt fatigued. Encouraged pt to contact CGerald Stabsat HDan[place]due to L foot catching ground and difficulty bending knee      PATIENT EDUCATION: Education details: Goal outcomes, continue HEP, taking break from therapy for 3-6 months  Person educated: Patient Education method: ECustomer service managerEducation comprehension: verbalized understanding   HOME EXERCISE PROGRAM: Access Code: [email or web address] GOALS: Goals reviewed with  patient? Yes  LONG TERM GOALS: Target date: 04/15/2022  Pt will be independent with final HEP for improved strength, balance, transfers and gait.  Baseline:  Goal status: IN PROGRESS  2.  Pt will improve 5 x STS to less than or equal to 20 seconds w/BUE support to demonstrate improved functional strength and transfer efficiency.   Baseline: 28.78s w/BUE support; 21.5s w/BUE support 11/29 Goal status: IN PROGRESS  3.  Pt will ambulate >/= 260' on 2MWT w/LRAD mod I for improved cardiovascular endurance and functional mobility  Baseline: 261 w/Lofstrand and S*; 283' w/Lofstrand and S*  Goal status: PARTIALLY MET  4.  Pt will improve gait velocity to at least 2.5 ft/s w/LRAD mod I for improved gait efficiency   Baseline: 2.34 ft/s w/Lofstrand and CGA Goal status: IN PROGRESS  NEW LONG TERM GOALS FOR UPDATED POC:   Target date: 05/18/2022  Pt will be independent with final HEP for improved strength, balance, transfers and gait. Baseline:  Baseline:  Goal status: MET  2.  Pt will improve 5 x STS to less than or equal to 18 seconds w/BUE support to demonstrate improved functional strength and transfer efficiency.  Baseline: 28.78s w/BUE support; 21.5s w/BUE support 11/29; 25.16s w/BUE support on 12/27  Goal status: NOT MET  3.  Pt will improve gait velocity to at least 2.5 ft/s w/LRAD mod I for improved gait efficiency  Baseline: 2.34 ft/s w/Lofstrand and CGA;  2.63f/s w/Lofstrand mod I Goal status: MET  4.  Pt will improve normal TUG to less than or equal to 18 seconds w/LRAD mod I for improved functional mobility and decreased fall risk.  Baseline: 21.48s w/Lofstrand; 26.81s w/Lofstrand  Goal status: NOT MET  5.  Pt will ambulate 500' on level indoor surfaces w/LRAD mod I for improved endurance and functional mobility  Baseline: 460' w/Lofstrand and SBA Goal status: NOT MET   ASSESSMENT:  CLINICAL IMPRESSION: Emphasis of skilled PT session on LTG assessment and DC from  PT. Pt has met 2 of 5 LTGs, improving her gait speed to goal level and performing her HEP independently. Pt did not meet her 5x STS or TUG goal but did improve the quality of her movement with each w/decreased assistance. Pt able to ambulate 460' prior to needing to sit due to R knee pain, narrowly missing her goal of 500'. At this time, pt in agreement to take a pause from PT due to plateau in improvement and needing to work on endurance and lateral weight shifting at home. Pt would also like time to focus on her remaining health deficits during this time and is in agreement to DC from PT today.    OBJECTIVE IMPAIRMENTS Abnormal gait, decreased activity tolerance, decreased endurance, decreased knowledge of use of DME, decreased mobility, difficulty walking, decreased strength, increased edema, prosthetic dependency , obesity, and pain.   ACTIVITY LIMITATIONS carrying, lifting, squatting, stairs, transfers, locomotion level, and caring for others  PARTICIPATION LIMITATIONS: meal prep, cleaning, laundry, driving, shopping, community activity, occupation, and yard work  PERSONAL FEducation officer, community Past/current experiences, Transportation, and 1-2 comorbidities: dialysis on T/Th/Sat and chronic pain of R knee  are also affecting patient's functional outcome.   REHAB POTENTIAL: Good  CLINICAL DECISION MAKING: Evolving/moderate complexity  EVALUATION COMPLEXITY: Moderate  PLAN: PT FREQUENCY: 2x/week  PT DURATION: 8 weeks + 4 weeks (recert)   PLANNED INTERVENTIONS: Therapeutic exercises, Therapeutic activity, Neuromuscular re-education, Balance training, Gait training, Patient/Family education, Self Care, Joint mobilization, Stair training, Vestibular training, Canalith repositioning, Prosthetic training, DME instructions, Dry Needling, Cryotherapy, Moist heat, Compression bandaging, Taping, Manual therapy, and Re-evaluation   [place] Strength E Towanda Hornstein, PT, DPT 05/18/2022, 10:03 AM

## 2022-05-19 DIAGNOSIS — Z992 Dependence on renal dialysis: Secondary | ICD-10-CM | POA: Diagnosis not present

## 2022-05-19 DIAGNOSIS — D689 Coagulation defect, unspecified: Secondary | ICD-10-CM | POA: Diagnosis not present

## 2022-05-19 DIAGNOSIS — L299 Pruritus, unspecified: Secondary | ICD-10-CM | POA: Diagnosis not present

## 2022-05-19 DIAGNOSIS — D509 Iron deficiency anemia, unspecified: Secondary | ICD-10-CM | POA: Diagnosis not present

## 2022-05-19 DIAGNOSIS — R197 Diarrhea, unspecified: Secondary | ICD-10-CM | POA: Diagnosis not present

## 2022-05-19 DIAGNOSIS — D631 Anemia in chronic kidney disease: Secondary | ICD-10-CM | POA: Diagnosis not present

## 2022-05-19 DIAGNOSIS — N2581 Secondary hyperparathyroidism of renal origin: Secondary | ICD-10-CM | POA: Diagnosis not present

## 2022-05-19 DIAGNOSIS — N186 End stage renal disease: Secondary | ICD-10-CM | POA: Diagnosis not present

## 2022-05-19 DIAGNOSIS — R52 Pain, unspecified: Secondary | ICD-10-CM | POA: Diagnosis not present

## 2022-05-20 ENCOUNTER — Ambulatory Visit: Payer: Medicare Other | Admitting: Physical Therapy

## 2022-05-21 DIAGNOSIS — N2581 Secondary hyperparathyroidism of renal origin: Secondary | ICD-10-CM | POA: Diagnosis not present

## 2022-05-21 DIAGNOSIS — L299 Pruritus, unspecified: Secondary | ICD-10-CM | POA: Diagnosis not present

## 2022-05-21 DIAGNOSIS — R52 Pain, unspecified: Secondary | ICD-10-CM | POA: Diagnosis not present

## 2022-05-21 DIAGNOSIS — D631 Anemia in chronic kidney disease: Secondary | ICD-10-CM | POA: Diagnosis not present

## 2022-05-21 DIAGNOSIS — N186 End stage renal disease: Secondary | ICD-10-CM | POA: Diagnosis not present

## 2022-05-21 DIAGNOSIS — Z992 Dependence on renal dialysis: Secondary | ICD-10-CM | POA: Diagnosis not present

## 2022-05-21 DIAGNOSIS — D689 Coagulation defect, unspecified: Secondary | ICD-10-CM | POA: Diagnosis not present

## 2022-05-21 DIAGNOSIS — R197 Diarrhea, unspecified: Secondary | ICD-10-CM | POA: Diagnosis not present

## 2022-05-21 DIAGNOSIS — D509 Iron deficiency anemia, unspecified: Secondary | ICD-10-CM | POA: Diagnosis not present

## 2022-05-22 DIAGNOSIS — Z992 Dependence on renal dialysis: Secondary | ICD-10-CM | POA: Diagnosis not present

## 2022-05-22 DIAGNOSIS — N186 End stage renal disease: Secondary | ICD-10-CM | POA: Diagnosis not present

## 2022-05-22 DIAGNOSIS — E1122 Type 2 diabetes mellitus with diabetic chronic kidney disease: Secondary | ICD-10-CM | POA: Diagnosis not present

## 2022-05-24 DIAGNOSIS — D631 Anemia in chronic kidney disease: Secondary | ICD-10-CM | POA: Diagnosis not present

## 2022-05-24 DIAGNOSIS — D509 Iron deficiency anemia, unspecified: Secondary | ICD-10-CM | POA: Diagnosis not present

## 2022-05-24 DIAGNOSIS — E1122 Type 2 diabetes mellitus with diabetic chronic kidney disease: Secondary | ICD-10-CM | POA: Diagnosis not present

## 2022-05-24 DIAGNOSIS — N2581 Secondary hyperparathyroidism of renal origin: Secondary | ICD-10-CM | POA: Diagnosis not present

## 2022-05-24 DIAGNOSIS — R52 Pain, unspecified: Secondary | ICD-10-CM | POA: Diagnosis not present

## 2022-05-24 DIAGNOSIS — R197 Diarrhea, unspecified: Secondary | ICD-10-CM | POA: Diagnosis not present

## 2022-05-24 DIAGNOSIS — L299 Pruritus, unspecified: Secondary | ICD-10-CM | POA: Diagnosis not present

## 2022-05-24 DIAGNOSIS — N186 End stage renal disease: Secondary | ICD-10-CM | POA: Diagnosis not present

## 2022-05-24 DIAGNOSIS — Z992 Dependence on renal dialysis: Secondary | ICD-10-CM | POA: Diagnosis not present

## 2022-05-24 DIAGNOSIS — D689 Coagulation defect, unspecified: Secondary | ICD-10-CM | POA: Diagnosis not present

## 2022-05-26 DIAGNOSIS — E1122 Type 2 diabetes mellitus with diabetic chronic kidney disease: Secondary | ICD-10-CM | POA: Diagnosis not present

## 2022-05-26 DIAGNOSIS — D689 Coagulation defect, unspecified: Secondary | ICD-10-CM | POA: Diagnosis not present

## 2022-05-26 DIAGNOSIS — D509 Iron deficiency anemia, unspecified: Secondary | ICD-10-CM | POA: Diagnosis not present

## 2022-05-26 DIAGNOSIS — D631 Anemia in chronic kidney disease: Secondary | ICD-10-CM | POA: Diagnosis not present

## 2022-05-26 DIAGNOSIS — L299 Pruritus, unspecified: Secondary | ICD-10-CM | POA: Diagnosis not present

## 2022-05-26 DIAGNOSIS — N2581 Secondary hyperparathyroidism of renal origin: Secondary | ICD-10-CM | POA: Diagnosis not present

## 2022-05-26 DIAGNOSIS — Z992 Dependence on renal dialysis: Secondary | ICD-10-CM | POA: Diagnosis not present

## 2022-05-26 DIAGNOSIS — R52 Pain, unspecified: Secondary | ICD-10-CM | POA: Diagnosis not present

## 2022-05-26 DIAGNOSIS — N186 End stage renal disease: Secondary | ICD-10-CM | POA: Diagnosis not present

## 2022-05-26 DIAGNOSIS — R197 Diarrhea, unspecified: Secondary | ICD-10-CM | POA: Diagnosis not present

## 2022-05-27 ENCOUNTER — Ambulatory Visit (INDEPENDENT_AMBULATORY_CARE_PROVIDER_SITE_OTHER): Payer: Medicare Other | Admitting: Internal Medicine

## 2022-05-27 DIAGNOSIS — Z992 Dependence on renal dialysis: Secondary | ICD-10-CM | POA: Diagnosis not present

## 2022-05-27 DIAGNOSIS — Z79891 Long term (current) use of opiate analgesic: Secondary | ICD-10-CM | POA: Diagnosis not present

## 2022-05-27 DIAGNOSIS — M25519 Pain in unspecified shoulder: Secondary | ICD-10-CM | POA: Diagnosis not present

## 2022-05-27 DIAGNOSIS — R6889 Other general symptoms and signs: Secondary | ICD-10-CM | POA: Diagnosis not present

## 2022-05-27 DIAGNOSIS — I159 Secondary hypertension, unspecified: Secondary | ICD-10-CM | POA: Diagnosis not present

## 2022-05-27 DIAGNOSIS — N186 End stage renal disease: Secondary | ICD-10-CM | POA: Diagnosis not present

## 2022-05-27 DIAGNOSIS — G894 Chronic pain syndrome: Secondary | ICD-10-CM | POA: Diagnosis not present

## 2022-05-27 DIAGNOSIS — M545 Low back pain, unspecified: Secondary | ICD-10-CM | POA: Diagnosis not present

## 2022-05-27 DIAGNOSIS — G890 Central pain syndrome: Secondary | ICD-10-CM | POA: Diagnosis not present

## 2022-05-27 DIAGNOSIS — G4733 Obstructive sleep apnea (adult) (pediatric): Secondary | ICD-10-CM | POA: Diagnosis not present

## 2022-05-27 DIAGNOSIS — M79643 Pain in unspecified hand: Secondary | ICD-10-CM | POA: Diagnosis not present

## 2022-05-27 DIAGNOSIS — M25562 Pain in left knee: Secondary | ICD-10-CM | POA: Diagnosis not present

## 2022-05-27 DIAGNOSIS — G546 Phantom limb syndrome with pain: Secondary | ICD-10-CM | POA: Diagnosis not present

## 2022-05-27 NOTE — Progress Notes (Unsigned)
[facility] Internal Medicine Residency Telephone Encounter Continuity Care Appointment  HPI:  This telephone encounter was created for [name] on 05/27/2022 for the following purpose/cc flu like symptoms.   Past Medical History:  Past Medical History:  Diagnosis Date   Abdominal muscle pain 09/08/2016   Abnormal Pap smear of cervix 2009   Abscess    history of multiple abscesses   Acute bilateral low back pain 02/13/2017   Acute blood loss anemia    Acute on chronic renal failure ([place]) 07/12/2012   Acute renal failure ([place]) 07/12/2012   Acute respiratory failure with hypoxia ([place]) 10/21/2019   Adjustment disorder with depressed mood 03/26/2017   AKI (acute kidney injury) ([place])    Anemia 10/21/2019   Anemia of chronic disease 2002   Anginal pain (HCC)    Anxiety    Panic attacks   Bilateral lower extremity edema 05/13/2016   Bipolar disorder ([place])    Cataract    B/L cataract   Cellulitis 05/21/2014   right eye   CHF (congestive heart failure) ([place])    Chronic bronchitis ([place])    "get it q yr" (05/13/2013)   Chronic pain    Chronic pain of right knee 09/08/2016   Dehiscence of amputation stump ([place])    Depression    Edema of lower extremity    Endocarditis 2002   subacute bacterial endocarditis.    Fall    Family history of anesthesia complication    "my mom has a hard time coming out from under"   Fibromyalgia    GERD (gastroesophageal reflux disease)    occ   Heart murmur    Herpes simplex type 1 infection 01/16/2018   History of blood transfusion    "just low blood count" (05/13/2013)   Hyperlipidemia    Hypertension    Hypothyroidism    Hypothyroidism, adult 03/21/2014   Hypoxia    Leukocytosis    Muscle spasm 12/16/2020   Necrosis ([place])    and ulceration   Necrotizing fasciitis s/p OR debridements 07/06/2012   Obesity    OSA on CPAP    does not wear all the time   Peripheral neuropathy    Pneumonia    Pyelonephritis 12/31/2020    Routine adult health maintenance 12/08/2016   Severe protein-calorie malnutrition ([place])    Status post below knee amputation, left ([place]) 04/11/2017   Type II diabetes mellitus (HCC)    Type  II   Vaginal irritation 03/14/2016   Weak pulse 07/21/2021   Wound dehiscence 09/04/2019     ROS:  Negative unless stated in the HPI.   Assessment / Plan / Recommendations:  Please see A&P under problem oriented charting for assessment of the patient's acute and chronic medical conditions.  As always, pt is advised that if symptoms worsen or new symptoms arise, they should go to an urgent care facility or to to ER for further evaluation.   Consent and Medical Decision Making:  Patient discussed with Dr. Angelia Mould This is a telephone encounter between [facility] and Idamae Schuller on 05/27/2022 for flu like symptoms. The visit was conducted with the patient located at home and Idamae Schuller at [facility]. The patient's identity was confirmed using their DOB and current address. The patient has consented to being evaluated through a telephone encounter and understands the associated risks (an examination cannot be done and the patient may need to come in for an appointment) / benefits (allows the patient to remain at home,  decreasing exposure to coronavirus). I personally spent 20 minutes on medical discussion.     Flu-like symptoms Pt has flu like symptoms that have been present for one week. Pt's symptoms are resolving and stated symptoms today are better than yesterday. I suspect she has a viral URI and is on the resolving end of the illness. Advised continuing her supportive measures including moderate hydration, using antipyretics if needed. Gave precautions to seek urgent and emergent care. Pt has a follow up with her PCP next week but I suspect her symptoms will resolve by then.

## 2022-05-28 DIAGNOSIS — R52 Pain, unspecified: Secondary | ICD-10-CM | POA: Diagnosis not present

## 2022-05-28 DIAGNOSIS — D509 Iron deficiency anemia, unspecified: Secondary | ICD-10-CM | POA: Diagnosis not present

## 2022-05-28 DIAGNOSIS — Z992 Dependence on renal dialysis: Secondary | ICD-10-CM | POA: Diagnosis not present

## 2022-05-28 DIAGNOSIS — L299 Pruritus, unspecified: Secondary | ICD-10-CM | POA: Diagnosis not present

## 2022-05-28 DIAGNOSIS — D689 Coagulation defect, unspecified: Secondary | ICD-10-CM | POA: Diagnosis not present

## 2022-05-28 DIAGNOSIS — N2581 Secondary hyperparathyroidism of renal origin: Secondary | ICD-10-CM | POA: Diagnosis not present

## 2022-05-28 DIAGNOSIS — E1122 Type 2 diabetes mellitus with diabetic chronic kidney disease: Secondary | ICD-10-CM | POA: Diagnosis not present

## 2022-05-28 DIAGNOSIS — D631 Anemia in chronic kidney disease: Secondary | ICD-10-CM | POA: Diagnosis not present

## 2022-05-28 DIAGNOSIS — N186 End stage renal disease: Secondary | ICD-10-CM | POA: Diagnosis not present

## 2022-05-28 DIAGNOSIS — R197 Diarrhea, unspecified: Secondary | ICD-10-CM | POA: Diagnosis not present

## 2022-05-29 DIAGNOSIS — R6889 Other general symptoms and signs: Secondary | ICD-10-CM | POA: Insufficient documentation

## 2022-05-29 NOTE — Assessment & Plan Note (Signed)
Pt has flu like symptoms that have been present for one week. Pt's symptoms are resolving and stated symptoms today are better than yesterday. I suspect she has a viral URI and is on the resolving end of the illness. Advised continuing her supportive measures including moderate hydration, using antipyretics if needed. Gave precautions to seek urgent and emergent care. Pt has a follow up with her PCP next week but I suspect her symptoms will resolve by then.

## 2022-05-31 DIAGNOSIS — D509 Iron deficiency anemia, unspecified: Secondary | ICD-10-CM | POA: Diagnosis not present

## 2022-05-31 DIAGNOSIS — Z992 Dependence on renal dialysis: Secondary | ICD-10-CM | POA: Diagnosis not present

## 2022-05-31 DIAGNOSIS — R197 Diarrhea, unspecified: Secondary | ICD-10-CM | POA: Diagnosis not present

## 2022-05-31 DIAGNOSIS — L299 Pruritus, unspecified: Secondary | ICD-10-CM | POA: Diagnosis not present

## 2022-05-31 DIAGNOSIS — E1122 Type 2 diabetes mellitus with diabetic chronic kidney disease: Secondary | ICD-10-CM | POA: Diagnosis not present

## 2022-05-31 DIAGNOSIS — R52 Pain, unspecified: Secondary | ICD-10-CM | POA: Diagnosis not present

## 2022-05-31 DIAGNOSIS — D631 Anemia in chronic kidney disease: Secondary | ICD-10-CM | POA: Diagnosis not present

## 2022-05-31 DIAGNOSIS — N2581 Secondary hyperparathyroidism of renal origin: Secondary | ICD-10-CM | POA: Diagnosis not present

## 2022-05-31 DIAGNOSIS — D689 Coagulation defect, unspecified: Secondary | ICD-10-CM | POA: Diagnosis not present

## 2022-05-31 DIAGNOSIS — N186 End stage renal disease: Secondary | ICD-10-CM | POA: Diagnosis not present

## 2022-06-01 ENCOUNTER — Encounter: Payer: Medicare Other | Admitting: Student

## 2022-06-02 DIAGNOSIS — L299 Pruritus, unspecified: Secondary | ICD-10-CM | POA: Diagnosis not present

## 2022-06-02 DIAGNOSIS — Z992 Dependence on renal dialysis: Secondary | ICD-10-CM | POA: Diagnosis not present

## 2022-06-02 DIAGNOSIS — E1122 Type 2 diabetes mellitus with diabetic chronic kidney disease: Secondary | ICD-10-CM | POA: Diagnosis not present

## 2022-06-02 DIAGNOSIS — D509 Iron deficiency anemia, unspecified: Secondary | ICD-10-CM | POA: Diagnosis not present

## 2022-06-02 DIAGNOSIS — D689 Coagulation defect, unspecified: Secondary | ICD-10-CM | POA: Diagnosis not present

## 2022-06-02 DIAGNOSIS — D631 Anemia in chronic kidney disease: Secondary | ICD-10-CM | POA: Diagnosis not present

## 2022-06-02 DIAGNOSIS — N186 End stage renal disease: Secondary | ICD-10-CM | POA: Diagnosis not present

## 2022-06-02 DIAGNOSIS — R197 Diarrhea, unspecified: Secondary | ICD-10-CM | POA: Diagnosis not present

## 2022-06-02 DIAGNOSIS — R52 Pain, unspecified: Secondary | ICD-10-CM | POA: Diagnosis not present

## 2022-06-02 DIAGNOSIS — N2581 Secondary hyperparathyroidism of renal origin: Secondary | ICD-10-CM | POA: Diagnosis not present

## 2022-06-04 DIAGNOSIS — N186 End stage renal disease: Secondary | ICD-10-CM | POA: Diagnosis not present

## 2022-06-04 DIAGNOSIS — E1122 Type 2 diabetes mellitus with diabetic chronic kidney disease: Secondary | ICD-10-CM | POA: Diagnosis not present

## 2022-06-04 DIAGNOSIS — R52 Pain, unspecified: Secondary | ICD-10-CM | POA: Diagnosis not present

## 2022-06-04 DIAGNOSIS — D509 Iron deficiency anemia, unspecified: Secondary | ICD-10-CM | POA: Diagnosis not present

## 2022-06-04 DIAGNOSIS — R197 Diarrhea, unspecified: Secondary | ICD-10-CM | POA: Diagnosis not present

## 2022-06-04 DIAGNOSIS — D689 Coagulation defect, unspecified: Secondary | ICD-10-CM | POA: Diagnosis not present

## 2022-06-04 DIAGNOSIS — Z992 Dependence on renal dialysis: Secondary | ICD-10-CM | POA: Diagnosis not present

## 2022-06-04 DIAGNOSIS — N2581 Secondary hyperparathyroidism of renal origin: Secondary | ICD-10-CM | POA: Diagnosis not present

## 2022-06-04 DIAGNOSIS — L299 Pruritus, unspecified: Secondary | ICD-10-CM | POA: Diagnosis not present

## 2022-06-04 DIAGNOSIS — D631 Anemia in chronic kidney disease: Secondary | ICD-10-CM | POA: Diagnosis not present

## 2022-06-07 DIAGNOSIS — N186 End stage renal disease: Secondary | ICD-10-CM | POA: Diagnosis not present

## 2022-06-07 DIAGNOSIS — R52 Pain, unspecified: Secondary | ICD-10-CM | POA: Diagnosis not present

## 2022-06-07 DIAGNOSIS — Z992 Dependence on renal dialysis: Secondary | ICD-10-CM | POA: Diagnosis not present

## 2022-06-07 DIAGNOSIS — R197 Diarrhea, unspecified: Secondary | ICD-10-CM | POA: Diagnosis not present

## 2022-06-07 DIAGNOSIS — N2581 Secondary hyperparathyroidism of renal origin: Secondary | ICD-10-CM | POA: Diagnosis not present

## 2022-06-07 DIAGNOSIS — E1122 Type 2 diabetes mellitus with diabetic chronic kidney disease: Secondary | ICD-10-CM | POA: Diagnosis not present

## 2022-06-07 DIAGNOSIS — D509 Iron deficiency anemia, unspecified: Secondary | ICD-10-CM | POA: Diagnosis not present

## 2022-06-07 DIAGNOSIS — D631 Anemia in chronic kidney disease: Secondary | ICD-10-CM | POA: Diagnosis not present

## 2022-06-07 DIAGNOSIS — L299 Pruritus, unspecified: Secondary | ICD-10-CM | POA: Diagnosis not present

## 2022-06-07 DIAGNOSIS — D689 Coagulation defect, unspecified: Secondary | ICD-10-CM | POA: Diagnosis not present

## 2022-06-07 NOTE — Progress Notes (Signed)
Internal Medicine Clinic Attending  Case discussed at the time of the visit.  We reviewed the resident's history and exam and pertinent patient test results.  I agree with the assessment, diagnosis, and plan of care documented in the resident's note.

## 2022-06-09 ENCOUNTER — Other Ambulatory Visit: Payer: Self-pay | Admitting: Vascular Surgery

## 2022-06-09 DIAGNOSIS — R197 Diarrhea, unspecified: Secondary | ICD-10-CM | POA: Diagnosis not present

## 2022-06-09 DIAGNOSIS — R52 Pain, unspecified: Secondary | ICD-10-CM | POA: Diagnosis not present

## 2022-06-09 DIAGNOSIS — Z992 Dependence on renal dialysis: Secondary | ICD-10-CM | POA: Diagnosis not present

## 2022-06-09 DIAGNOSIS — D689 Coagulation defect, unspecified: Secondary | ICD-10-CM | POA: Diagnosis not present

## 2022-06-09 DIAGNOSIS — N2581 Secondary hyperparathyroidism of renal origin: Secondary | ICD-10-CM | POA: Diagnosis not present

## 2022-06-09 DIAGNOSIS — N186 End stage renal disease: Secondary | ICD-10-CM | POA: Diagnosis not present

## 2022-06-09 DIAGNOSIS — E1122 Type 2 diabetes mellitus with diabetic chronic kidney disease: Secondary | ICD-10-CM | POA: Diagnosis not present

## 2022-06-09 DIAGNOSIS — L299 Pruritus, unspecified: Secondary | ICD-10-CM | POA: Diagnosis not present

## 2022-06-09 DIAGNOSIS — D631 Anemia in chronic kidney disease: Secondary | ICD-10-CM | POA: Diagnosis not present

## 2022-06-09 DIAGNOSIS — D509 Iron deficiency anemia, unspecified: Secondary | ICD-10-CM | POA: Diagnosis not present

## 2022-06-10 ENCOUNTER — Other Ambulatory Visit: Payer: Self-pay | Admitting: Internal Medicine

## 2022-06-10 DIAGNOSIS — E1159 Type 2 diabetes mellitus with other circulatory complications: Secondary | ICD-10-CM

## 2022-06-11 DIAGNOSIS — R52 Pain, unspecified: Secondary | ICD-10-CM | POA: Diagnosis not present

## 2022-06-11 DIAGNOSIS — R197 Diarrhea, unspecified: Secondary | ICD-10-CM | POA: Diagnosis not present

## 2022-06-11 DIAGNOSIS — D509 Iron deficiency anemia, unspecified: Secondary | ICD-10-CM | POA: Diagnosis not present

## 2022-06-11 DIAGNOSIS — E1122 Type 2 diabetes mellitus with diabetic chronic kidney disease: Secondary | ICD-10-CM | POA: Diagnosis not present

## 2022-06-11 DIAGNOSIS — D631 Anemia in chronic kidney disease: Secondary | ICD-10-CM | POA: Diagnosis not present

## 2022-06-11 DIAGNOSIS — D689 Coagulation defect, unspecified: Secondary | ICD-10-CM | POA: Diagnosis not present

## 2022-06-11 DIAGNOSIS — N186 End stage renal disease: Secondary | ICD-10-CM | POA: Diagnosis not present

## 2022-06-11 DIAGNOSIS — Z992 Dependence on renal dialysis: Secondary | ICD-10-CM | POA: Diagnosis not present

## 2022-06-11 DIAGNOSIS — N2581 Secondary hyperparathyroidism of renal origin: Secondary | ICD-10-CM | POA: Diagnosis not present

## 2022-06-11 DIAGNOSIS — L299 Pruritus, unspecified: Secondary | ICD-10-CM | POA: Diagnosis not present

## 2022-06-14 DIAGNOSIS — R197 Diarrhea, unspecified: Secondary | ICD-10-CM | POA: Diagnosis not present

## 2022-06-14 DIAGNOSIS — L299 Pruritus, unspecified: Secondary | ICD-10-CM | POA: Diagnosis not present

## 2022-06-14 DIAGNOSIS — R52 Pain, unspecified: Secondary | ICD-10-CM | POA: Diagnosis not present

## 2022-06-14 DIAGNOSIS — Z992 Dependence on renal dialysis: Secondary | ICD-10-CM | POA: Diagnosis not present

## 2022-06-14 DIAGNOSIS — N186 End stage renal disease: Secondary | ICD-10-CM | POA: Diagnosis not present

## 2022-06-14 DIAGNOSIS — D631 Anemia in chronic kidney disease: Secondary | ICD-10-CM | POA: Diagnosis not present

## 2022-06-14 DIAGNOSIS — E1122 Type 2 diabetes mellitus with diabetic chronic kidney disease: Secondary | ICD-10-CM | POA: Diagnosis not present

## 2022-06-14 DIAGNOSIS — N2581 Secondary hyperparathyroidism of renal origin: Secondary | ICD-10-CM | POA: Diagnosis not present

## 2022-06-14 DIAGNOSIS — D689 Coagulation defect, unspecified: Secondary | ICD-10-CM | POA: Diagnosis not present

## 2022-06-14 DIAGNOSIS — D509 Iron deficiency anemia, unspecified: Secondary | ICD-10-CM | POA: Diagnosis not present

## 2022-06-16 DIAGNOSIS — E1122 Type 2 diabetes mellitus with diabetic chronic kidney disease: Secondary | ICD-10-CM | POA: Diagnosis not present

## 2022-06-16 DIAGNOSIS — N2581 Secondary hyperparathyroidism of renal origin: Secondary | ICD-10-CM | POA: Diagnosis not present

## 2022-06-16 DIAGNOSIS — Z992 Dependence on renal dialysis: Secondary | ICD-10-CM | POA: Diagnosis not present

## 2022-06-16 DIAGNOSIS — N186 End stage renal disease: Secondary | ICD-10-CM | POA: Diagnosis not present

## 2022-06-16 DIAGNOSIS — D631 Anemia in chronic kidney disease: Secondary | ICD-10-CM | POA: Diagnosis not present

## 2022-06-16 DIAGNOSIS — D689 Coagulation defect, unspecified: Secondary | ICD-10-CM | POA: Diagnosis not present

## 2022-06-16 DIAGNOSIS — R197 Diarrhea, unspecified: Secondary | ICD-10-CM | POA: Diagnosis not present

## 2022-06-16 DIAGNOSIS — R52 Pain, unspecified: Secondary | ICD-10-CM | POA: Diagnosis not present

## 2022-06-16 DIAGNOSIS — L299 Pruritus, unspecified: Secondary | ICD-10-CM | POA: Diagnosis not present

## 2022-06-16 DIAGNOSIS — D509 Iron deficiency anemia, unspecified: Secondary | ICD-10-CM | POA: Diagnosis not present

## 2022-06-18 DIAGNOSIS — N186 End stage renal disease: Secondary | ICD-10-CM | POA: Diagnosis not present

## 2022-06-18 DIAGNOSIS — R197 Diarrhea, unspecified: Secondary | ICD-10-CM | POA: Diagnosis not present

## 2022-06-18 DIAGNOSIS — R52 Pain, unspecified: Secondary | ICD-10-CM | POA: Diagnosis not present

## 2022-06-18 DIAGNOSIS — D631 Anemia in chronic kidney disease: Secondary | ICD-10-CM | POA: Diagnosis not present

## 2022-06-18 DIAGNOSIS — D509 Iron deficiency anemia, unspecified: Secondary | ICD-10-CM | POA: Diagnosis not present

## 2022-06-18 DIAGNOSIS — Z992 Dependence on renal dialysis: Secondary | ICD-10-CM | POA: Diagnosis not present

## 2022-06-18 DIAGNOSIS — E1122 Type 2 diabetes mellitus with diabetic chronic kidney disease: Secondary | ICD-10-CM | POA: Diagnosis not present

## 2022-06-18 DIAGNOSIS — N2581 Secondary hyperparathyroidism of renal origin: Secondary | ICD-10-CM | POA: Diagnosis not present

## 2022-06-18 DIAGNOSIS — L299 Pruritus, unspecified: Secondary | ICD-10-CM | POA: Diagnosis not present

## 2022-06-18 DIAGNOSIS — D689 Coagulation defect, unspecified: Secondary | ICD-10-CM | POA: Diagnosis not present

## 2022-06-21 DIAGNOSIS — L299 Pruritus, unspecified: Secondary | ICD-10-CM | POA: Diagnosis not present

## 2022-06-21 DIAGNOSIS — R52 Pain, unspecified: Secondary | ICD-10-CM | POA: Diagnosis not present

## 2022-06-21 DIAGNOSIS — D631 Anemia in chronic kidney disease: Secondary | ICD-10-CM | POA: Diagnosis not present

## 2022-06-21 DIAGNOSIS — Z992 Dependence on renal dialysis: Secondary | ICD-10-CM | POA: Diagnosis not present

## 2022-06-21 DIAGNOSIS — D509 Iron deficiency anemia, unspecified: Secondary | ICD-10-CM | POA: Diagnosis not present

## 2022-06-21 DIAGNOSIS — R197 Diarrhea, unspecified: Secondary | ICD-10-CM | POA: Diagnosis not present

## 2022-06-21 DIAGNOSIS — N2581 Secondary hyperparathyroidism of renal origin: Secondary | ICD-10-CM | POA: Diagnosis not present

## 2022-06-21 DIAGNOSIS — E1122 Type 2 diabetes mellitus with diabetic chronic kidney disease: Secondary | ICD-10-CM | POA: Diagnosis not present

## 2022-06-21 DIAGNOSIS — N186 End stage renal disease: Secondary | ICD-10-CM | POA: Diagnosis not present

## 2022-06-21 DIAGNOSIS — D689 Coagulation defect, unspecified: Secondary | ICD-10-CM | POA: Diagnosis not present

## 2022-06-22 DIAGNOSIS — N186 End stage renal disease: Secondary | ICD-10-CM | POA: Diagnosis not present

## 2022-06-22 DIAGNOSIS — Z992 Dependence on renal dialysis: Secondary | ICD-10-CM | POA: Diagnosis not present

## 2022-06-22 DIAGNOSIS — E1122 Type 2 diabetes mellitus with diabetic chronic kidney disease: Secondary | ICD-10-CM | POA: Diagnosis not present

## 2022-06-23 DIAGNOSIS — N2581 Secondary hyperparathyroidism of renal origin: Secondary | ICD-10-CM | POA: Diagnosis not present

## 2022-06-23 DIAGNOSIS — D509 Iron deficiency anemia, unspecified: Secondary | ICD-10-CM | POA: Diagnosis not present

## 2022-06-23 DIAGNOSIS — Z992 Dependence on renal dialysis: Secondary | ICD-10-CM | POA: Diagnosis not present

## 2022-06-23 DIAGNOSIS — D689 Coagulation defect, unspecified: Secondary | ICD-10-CM | POA: Diagnosis not present

## 2022-06-23 DIAGNOSIS — R52 Pain, unspecified: Secondary | ICD-10-CM | POA: Diagnosis not present

## 2022-06-23 DIAGNOSIS — N186 End stage renal disease: Secondary | ICD-10-CM | POA: Diagnosis not present

## 2022-06-23 DIAGNOSIS — D631 Anemia in chronic kidney disease: Secondary | ICD-10-CM | POA: Diagnosis not present

## 2022-06-23 DIAGNOSIS — L299 Pruritus, unspecified: Secondary | ICD-10-CM | POA: Diagnosis not present

## 2022-06-24 DIAGNOSIS — G894 Chronic pain syndrome: Secondary | ICD-10-CM | POA: Diagnosis not present

## 2022-06-24 DIAGNOSIS — M25519 Pain in unspecified shoulder: Secondary | ICD-10-CM | POA: Diagnosis not present

## 2022-06-24 DIAGNOSIS — G890 Central pain syndrome: Secondary | ICD-10-CM | POA: Diagnosis not present

## 2022-06-24 DIAGNOSIS — I159 Secondary hypertension, unspecified: Secondary | ICD-10-CM | POA: Diagnosis not present

## 2022-06-24 DIAGNOSIS — N186 End stage renal disease: Secondary | ICD-10-CM | POA: Diagnosis not present

## 2022-06-24 DIAGNOSIS — M79673 Pain in unspecified foot: Secondary | ICD-10-CM | POA: Diagnosis not present

## 2022-06-24 DIAGNOSIS — Z79891 Long term (current) use of opiate analgesic: Secondary | ICD-10-CM | POA: Diagnosis not present

## 2022-06-24 DIAGNOSIS — M545 Low back pain, unspecified: Secondary | ICD-10-CM | POA: Diagnosis not present

## 2022-06-24 DIAGNOSIS — G4733 Obstructive sleep apnea (adult) (pediatric): Secondary | ICD-10-CM | POA: Diagnosis not present

## 2022-06-24 DIAGNOSIS — Z992 Dependence on renal dialysis: Secondary | ICD-10-CM | POA: Diagnosis not present

## 2022-06-24 DIAGNOSIS — M25562 Pain in left knee: Secondary | ICD-10-CM | POA: Diagnosis not present

## 2022-06-24 DIAGNOSIS — G546 Phantom limb syndrome with pain: Secondary | ICD-10-CM | POA: Diagnosis not present

## 2022-06-25 DIAGNOSIS — N186 End stage renal disease: Secondary | ICD-10-CM | POA: Diagnosis not present

## 2022-06-25 DIAGNOSIS — R52 Pain, unspecified: Secondary | ICD-10-CM | POA: Diagnosis not present

## 2022-06-25 DIAGNOSIS — D689 Coagulation defect, unspecified: Secondary | ICD-10-CM | POA: Diagnosis not present

## 2022-06-25 DIAGNOSIS — Z992 Dependence on renal dialysis: Secondary | ICD-10-CM | POA: Diagnosis not present

## 2022-06-25 DIAGNOSIS — D509 Iron deficiency anemia, unspecified: Secondary | ICD-10-CM | POA: Diagnosis not present

## 2022-06-25 DIAGNOSIS — L299 Pruritus, unspecified: Secondary | ICD-10-CM | POA: Diagnosis not present

## 2022-06-25 DIAGNOSIS — D631 Anemia in chronic kidney disease: Secondary | ICD-10-CM | POA: Diagnosis not present

## 2022-06-25 DIAGNOSIS — N2581 Secondary hyperparathyroidism of renal origin: Secondary | ICD-10-CM | POA: Diagnosis not present

## 2022-06-28 DIAGNOSIS — Z992 Dependence on renal dialysis: Secondary | ICD-10-CM | POA: Diagnosis not present

## 2022-06-28 DIAGNOSIS — D689 Coagulation defect, unspecified: Secondary | ICD-10-CM | POA: Diagnosis not present

## 2022-06-28 DIAGNOSIS — D631 Anemia in chronic kidney disease: Secondary | ICD-10-CM | POA: Diagnosis not present

## 2022-06-28 DIAGNOSIS — L299 Pruritus, unspecified: Secondary | ICD-10-CM | POA: Diagnosis not present

## 2022-06-28 DIAGNOSIS — R52 Pain, unspecified: Secondary | ICD-10-CM | POA: Diagnosis not present

## 2022-06-28 DIAGNOSIS — D509 Iron deficiency anemia, unspecified: Secondary | ICD-10-CM | POA: Diagnosis not present

## 2022-06-28 DIAGNOSIS — N2581 Secondary hyperparathyroidism of renal origin: Secondary | ICD-10-CM | POA: Diagnosis not present

## 2022-06-28 DIAGNOSIS — N186 End stage renal disease: Secondary | ICD-10-CM | POA: Diagnosis not present

## 2022-06-30 DIAGNOSIS — N186 End stage renal disease: Secondary | ICD-10-CM | POA: Diagnosis not present

## 2022-06-30 DIAGNOSIS — D689 Coagulation defect, unspecified: Secondary | ICD-10-CM | POA: Diagnosis not present

## 2022-06-30 DIAGNOSIS — D509 Iron deficiency anemia, unspecified: Secondary | ICD-10-CM | POA: Diagnosis not present

## 2022-06-30 DIAGNOSIS — D631 Anemia in chronic kidney disease: Secondary | ICD-10-CM | POA: Diagnosis not present

## 2022-06-30 DIAGNOSIS — Z992 Dependence on renal dialysis: Secondary | ICD-10-CM | POA: Diagnosis not present

## 2022-06-30 DIAGNOSIS — R52 Pain, unspecified: Secondary | ICD-10-CM | POA: Diagnosis not present

## 2022-06-30 DIAGNOSIS — N2581 Secondary hyperparathyroidism of renal origin: Secondary | ICD-10-CM | POA: Diagnosis not present

## 2022-06-30 DIAGNOSIS — L299 Pruritus, unspecified: Secondary | ICD-10-CM | POA: Diagnosis not present

## 2022-07-02 DIAGNOSIS — D689 Coagulation defect, unspecified: Secondary | ICD-10-CM | POA: Diagnosis not present

## 2022-07-02 DIAGNOSIS — N2581 Secondary hyperparathyroidism of renal origin: Secondary | ICD-10-CM | POA: Diagnosis not present

## 2022-07-02 DIAGNOSIS — N186 End stage renal disease: Secondary | ICD-10-CM | POA: Diagnosis not present

## 2022-07-02 DIAGNOSIS — R52 Pain, unspecified: Secondary | ICD-10-CM | POA: Diagnosis not present

## 2022-07-02 DIAGNOSIS — D631 Anemia in chronic kidney disease: Secondary | ICD-10-CM | POA: Diagnosis not present

## 2022-07-02 DIAGNOSIS — Z992 Dependence on renal dialysis: Secondary | ICD-10-CM | POA: Diagnosis not present

## 2022-07-02 DIAGNOSIS — D509 Iron deficiency anemia, unspecified: Secondary | ICD-10-CM | POA: Diagnosis not present

## 2022-07-02 DIAGNOSIS — L299 Pruritus, unspecified: Secondary | ICD-10-CM | POA: Diagnosis not present

## 2022-07-04 ENCOUNTER — Other Ambulatory Visit: Payer: Self-pay | Admitting: Student

## 2022-07-04 DIAGNOSIS — F419 Anxiety disorder, unspecified: Secondary | ICD-10-CM

## 2022-07-05 DIAGNOSIS — D631 Anemia in chronic kidney disease: Secondary | ICD-10-CM | POA: Diagnosis not present

## 2022-07-05 DIAGNOSIS — R52 Pain, unspecified: Secondary | ICD-10-CM | POA: Diagnosis not present

## 2022-07-05 DIAGNOSIS — N2581 Secondary hyperparathyroidism of renal origin: Secondary | ICD-10-CM | POA: Diagnosis not present

## 2022-07-05 DIAGNOSIS — Z992 Dependence on renal dialysis: Secondary | ICD-10-CM | POA: Diagnosis not present

## 2022-07-05 DIAGNOSIS — N186 End stage renal disease: Secondary | ICD-10-CM | POA: Diagnosis not present

## 2022-07-05 DIAGNOSIS — D509 Iron deficiency anemia, unspecified: Secondary | ICD-10-CM | POA: Diagnosis not present

## 2022-07-05 DIAGNOSIS — L299 Pruritus, unspecified: Secondary | ICD-10-CM | POA: Diagnosis not present

## 2022-07-05 DIAGNOSIS — D689 Coagulation defect, unspecified: Secondary | ICD-10-CM | POA: Diagnosis not present

## 2022-07-07 DIAGNOSIS — R52 Pain, unspecified: Secondary | ICD-10-CM | POA: Diagnosis not present

## 2022-07-07 DIAGNOSIS — N186 End stage renal disease: Secondary | ICD-10-CM | POA: Diagnosis not present

## 2022-07-07 DIAGNOSIS — Z992 Dependence on renal dialysis: Secondary | ICD-10-CM | POA: Diagnosis not present

## 2022-07-07 DIAGNOSIS — D509 Iron deficiency anemia, unspecified: Secondary | ICD-10-CM | POA: Diagnosis not present

## 2022-07-07 DIAGNOSIS — N2581 Secondary hyperparathyroidism of renal origin: Secondary | ICD-10-CM | POA: Diagnosis not present

## 2022-07-07 DIAGNOSIS — D689 Coagulation defect, unspecified: Secondary | ICD-10-CM | POA: Diagnosis not present

## 2022-07-07 DIAGNOSIS — D631 Anemia in chronic kidney disease: Secondary | ICD-10-CM | POA: Diagnosis not present

## 2022-07-07 DIAGNOSIS — L299 Pruritus, unspecified: Secondary | ICD-10-CM | POA: Diagnosis not present

## 2022-07-09 DIAGNOSIS — Z992 Dependence on renal dialysis: Secondary | ICD-10-CM | POA: Diagnosis not present

## 2022-07-09 DIAGNOSIS — R52 Pain, unspecified: Secondary | ICD-10-CM | POA: Diagnosis not present

## 2022-07-09 DIAGNOSIS — N186 End stage renal disease: Secondary | ICD-10-CM | POA: Diagnosis not present

## 2022-07-09 DIAGNOSIS — N2581 Secondary hyperparathyroidism of renal origin: Secondary | ICD-10-CM | POA: Diagnosis not present

## 2022-07-09 DIAGNOSIS — D509 Iron deficiency anemia, unspecified: Secondary | ICD-10-CM | POA: Diagnosis not present

## 2022-07-09 DIAGNOSIS — L299 Pruritus, unspecified: Secondary | ICD-10-CM | POA: Diagnosis not present

## 2022-07-09 DIAGNOSIS — D689 Coagulation defect, unspecified: Secondary | ICD-10-CM | POA: Diagnosis not present

## 2022-07-09 DIAGNOSIS — D631 Anemia in chronic kidney disease: Secondary | ICD-10-CM | POA: Diagnosis not present

## 2022-07-11 ENCOUNTER — Other Ambulatory Visit: Payer: Self-pay | Admitting: Student

## 2022-07-11 DIAGNOSIS — N760 Acute vaginitis: Secondary | ICD-10-CM

## 2022-07-12 DIAGNOSIS — Z992 Dependence on renal dialysis: Secondary | ICD-10-CM | POA: Diagnosis not present

## 2022-07-12 DIAGNOSIS — D509 Iron deficiency anemia, unspecified: Secondary | ICD-10-CM | POA: Diagnosis not present

## 2022-07-12 DIAGNOSIS — N2581 Secondary hyperparathyroidism of renal origin: Secondary | ICD-10-CM | POA: Diagnosis not present

## 2022-07-12 DIAGNOSIS — L299 Pruritus, unspecified: Secondary | ICD-10-CM | POA: Diagnosis not present

## 2022-07-12 DIAGNOSIS — N186 End stage renal disease: Secondary | ICD-10-CM | POA: Diagnosis not present

## 2022-07-12 DIAGNOSIS — D631 Anemia in chronic kidney disease: Secondary | ICD-10-CM | POA: Diagnosis not present

## 2022-07-12 DIAGNOSIS — R52 Pain, unspecified: Secondary | ICD-10-CM | POA: Diagnosis not present

## 2022-07-12 DIAGNOSIS — D689 Coagulation defect, unspecified: Secondary | ICD-10-CM | POA: Diagnosis not present

## 2022-07-12 NOTE — Telephone Encounter (Signed)
Patient was called to ask about need for Diflucan.  No answer massage again left to give the Clinics a call.

## 2022-07-12 NOTE — Telephone Encounter (Signed)
Thanks, will need to clarify before refilling

## 2022-07-13 NOTE — Telephone Encounter (Signed)
RTC from patient states has the vaginal itching.  No discharge as of yet.  Is taking Jardiance.  Also has a Herpes outbreak on her hin .  Wants to get ointment that was prescribed for that as well.   Thinks it was Acyclovir.  Please sent to [facility].

## 2022-07-13 NOTE — Telephone Encounter (Signed)
Jardiance was discontinued at her last appointment so she should no longer be taking this with her ESRD. Do we have any availability for an appointment today?

## 2022-07-14 DIAGNOSIS — R52 Pain, unspecified: Secondary | ICD-10-CM | POA: Diagnosis not present

## 2022-07-14 DIAGNOSIS — D631 Anemia in chronic kidney disease: Secondary | ICD-10-CM | POA: Diagnosis not present

## 2022-07-14 DIAGNOSIS — N186 End stage renal disease: Secondary | ICD-10-CM | POA: Diagnosis not present

## 2022-07-14 DIAGNOSIS — L299 Pruritus, unspecified: Secondary | ICD-10-CM | POA: Diagnosis not present

## 2022-07-14 DIAGNOSIS — N2581 Secondary hyperparathyroidism of renal origin: Secondary | ICD-10-CM | POA: Diagnosis not present

## 2022-07-14 DIAGNOSIS — D689 Coagulation defect, unspecified: Secondary | ICD-10-CM | POA: Diagnosis not present

## 2022-07-14 DIAGNOSIS — D509 Iron deficiency anemia, unspecified: Secondary | ICD-10-CM | POA: Diagnosis not present

## 2022-07-14 DIAGNOSIS — Z992 Dependence on renal dialysis: Secondary | ICD-10-CM | POA: Diagnosis not present

## 2022-07-15 DIAGNOSIS — H3582 Retinal ischemia: Secondary | ICD-10-CM | POA: Diagnosis not present

## 2022-07-15 DIAGNOSIS — E113511 Type 2 diabetes mellitus with proliferative diabetic retinopathy with macular edema, right eye: Secondary | ICD-10-CM | POA: Diagnosis not present

## 2022-07-15 DIAGNOSIS — Z961 Presence of intraocular lens: Secondary | ICD-10-CM | POA: Diagnosis not present

## 2022-07-15 DIAGNOSIS — H4312 Vitreous hemorrhage, left eye: Secondary | ICD-10-CM | POA: Diagnosis not present

## 2022-07-15 DIAGNOSIS — H53132 Sudden visual loss, left eye: Secondary | ICD-10-CM | POA: Diagnosis not present

## 2022-07-15 DIAGNOSIS — E113522 Type 2 diabetes mellitus with proliferative diabetic retinopathy with traction retinal detachment involving the macula, left eye: Secondary | ICD-10-CM | POA: Diagnosis not present

## 2022-07-16 DIAGNOSIS — L299 Pruritus, unspecified: Secondary | ICD-10-CM | POA: Diagnosis not present

## 2022-07-16 DIAGNOSIS — N186 End stage renal disease: Secondary | ICD-10-CM | POA: Diagnosis not present

## 2022-07-16 DIAGNOSIS — D509 Iron deficiency anemia, unspecified: Secondary | ICD-10-CM | POA: Diagnosis not present

## 2022-07-16 DIAGNOSIS — N2581 Secondary hyperparathyroidism of renal origin: Secondary | ICD-10-CM | POA: Diagnosis not present

## 2022-07-16 DIAGNOSIS — Z992 Dependence on renal dialysis: Secondary | ICD-10-CM | POA: Diagnosis not present

## 2022-07-16 DIAGNOSIS — D689 Coagulation defect, unspecified: Secondary | ICD-10-CM | POA: Diagnosis not present

## 2022-07-16 DIAGNOSIS — D631 Anemia in chronic kidney disease: Secondary | ICD-10-CM | POA: Diagnosis not present

## 2022-07-16 DIAGNOSIS — R52 Pain, unspecified: Secondary | ICD-10-CM | POA: Diagnosis not present

## 2022-07-19 ENCOUNTER — Other Ambulatory Visit: Payer: Self-pay | Admitting: Student

## 2022-07-19 DIAGNOSIS — N2581 Secondary hyperparathyroidism of renal origin: Secondary | ICD-10-CM | POA: Diagnosis not present

## 2022-07-19 DIAGNOSIS — D509 Iron deficiency anemia, unspecified: Secondary | ICD-10-CM | POA: Diagnosis not present

## 2022-07-19 DIAGNOSIS — N186 End stage renal disease: Secondary | ICD-10-CM | POA: Diagnosis not present

## 2022-07-19 DIAGNOSIS — D689 Coagulation defect, unspecified: Secondary | ICD-10-CM | POA: Diagnosis not present

## 2022-07-19 DIAGNOSIS — R52 Pain, unspecified: Secondary | ICD-10-CM | POA: Diagnosis not present

## 2022-07-19 DIAGNOSIS — D631 Anemia in chronic kidney disease: Secondary | ICD-10-CM | POA: Diagnosis not present

## 2022-07-19 DIAGNOSIS — Z992 Dependence on renal dialysis: Secondary | ICD-10-CM | POA: Diagnosis not present

## 2022-07-19 DIAGNOSIS — L299 Pruritus, unspecified: Secondary | ICD-10-CM | POA: Diagnosis not present

## 2022-07-20 ENCOUNTER — Telehealth: Payer: Self-pay | Admitting: *Deleted

## 2022-07-20 ENCOUNTER — Ambulatory Visit (INDEPENDENT_AMBULATORY_CARE_PROVIDER_SITE_OTHER): Payer: 59 | Admitting: Student

## 2022-07-20 VITALS — BP 170/91 | HR 86 | Temp 98.8°F | Ht 69.5 in | Wt 290.5 lb

## 2022-07-20 DIAGNOSIS — F4329 Adjustment disorder with other symptoms: Secondary | ICD-10-CM | POA: Diagnosis not present

## 2022-07-20 DIAGNOSIS — E1122 Type 2 diabetes mellitus with diabetic chronic kidney disease: Secondary | ICD-10-CM | POA: Diagnosis not present

## 2022-07-20 DIAGNOSIS — I152 Hypertension secondary to endocrine disorders: Secondary | ICD-10-CM

## 2022-07-20 DIAGNOSIS — L700 Acne vulgaris: Secondary | ICD-10-CM | POA: Diagnosis not present

## 2022-07-20 DIAGNOSIS — R0981 Nasal congestion: Secondary | ICD-10-CM | POA: Diagnosis not present

## 2022-07-20 DIAGNOSIS — N186 End stage renal disease: Secondary | ICD-10-CM | POA: Diagnosis not present

## 2022-07-20 DIAGNOSIS — Z992 Dependence on renal dialysis: Secondary | ICD-10-CM

## 2022-07-20 DIAGNOSIS — E785 Hyperlipidemia, unspecified: Secondary | ICD-10-CM | POA: Diagnosis not present

## 2022-07-20 DIAGNOSIS — N898 Other specified noninflammatory disorders of vagina: Secondary | ICD-10-CM

## 2022-07-20 DIAGNOSIS — L709 Acne, unspecified: Secondary | ICD-10-CM

## 2022-07-20 DIAGNOSIS — F32A Depression, unspecified: Secondary | ICD-10-CM

## 2022-07-20 DIAGNOSIS — E1159 Type 2 diabetes mellitus with other circulatory complications: Secondary | ICD-10-CM

## 2022-07-20 DIAGNOSIS — F419 Anxiety disorder, unspecified: Secondary | ICD-10-CM

## 2022-07-20 DIAGNOSIS — M109 Gout, unspecified: Secondary | ICD-10-CM

## 2022-07-20 LAB — GLUCOSE, CAPILLARY: Glucose-Capillary: 165 mg/dL — ABNORMAL HIGH (ref 70–99)

## 2022-07-20 LAB — POCT GLYCOSYLATED HEMOGLOBIN (HGB A1C): Hemoglobin A1C: 8 % — AB (ref 4.0–5.6)

## 2022-07-20 MED ORDER — FLUCONAZOLE 150 MG PO TABS: ORAL_TABLET | ORAL | 0 refills | Status: DC

## 2022-07-20 MED ORDER — TRETINOIN 0.025 % EX CREA: TOPICAL_CREAM | Freq: Every day | CUTANEOUS | 0 refills | Status: DC

## 2022-07-20 MED ORDER — MOUNJARO 5 MG/0.5ML ~~LOC~~ SOAJ: 5.0000 mg | SUBCUTANEOUS | 1 refills | Status: DC

## 2022-07-20 NOTE — Telephone Encounter (Signed)
Call from patient requesting refill on Diflucan and Acyclovir.  Patient was informed that she will need to be seen priorto refill.  Patient given appointment for 3:15 PM today.

## 2022-07-20 NOTE — Patient Instructions (Addendum)
Thank you, Ms.Adrena Yarbro for allowing [place] to provide your care today. Today we discussed .  Hihg blood pressure Please continue your amlodipine and talk with Dr. Hollie Salk about your blood pressure  Kidney disease Please continue to follow with Dr. Hollie Salk and attend dialysis sessions  Nasal congestion Please continue flonase/mucinex. Call us if you start developing any fevers, chills, etc  Diabetes Please continue your mounjaro 5 mg daily  Vaginal itching Please take the diflucan one pill and then another 2-3 days later. If your symptoms don't resolve call us as you may need a vaginal swab.   Stressors With the increase stress in your life please continue your wellbutrin and follow up with counseling with [facility]    Acne We will start topical creams. Please apply them to your face. If you begin having, burning itching of your face please wash immediately and call our clinic. I have also placed a referral to dermatology  I have ordered the following labs for you:   Lab Orders         Glucose, capillary         POC Hbg A1C      Referrals ordered today:    Referral Orders         Ambulatory referral to Dermatology         Ambulatory referral to [place]      I have ordered the following medication/changed the following medications:   Stop the following medications: Medications Discontinued During This Encounter  Medication Reason   OZEMPIC, 2 MG/DOSE, 8 MG/3ML SOPN    tirzepatide (MOUNJARO) 2.5 MG/0.5ML Pen      Start the following medications: Meds ordered this encounter  Medications   tirzepatide (MOUNJARO) 5 MG/0.5ML Pen    Sig: Inject 5 mg into the skin once a week.    Dispense:  0.5 mL    Refill:  1   fluconazole (DIFLUCAN) 150 MG tablet    Sig: Take 1 pill on day 1 and another pill 3 days later    Dispense:  2 tablet    Refill:  0   tretinoin (RETIN-A) 0.025 % cream    Sig: Apply topically at bedtime.    Dispense:  45 g    Refill:  0      Follow up: 3 months    Should you have any questions or concerns please call the internal medicine clinic at [phone].    Sanjuana Letters, D.O. [place]

## 2022-07-21 ENCOUNTER — Encounter (HOSPITAL_COMMUNITY): Payer: Self-pay | Admitting: Ophthalmology

## 2022-07-21 DIAGNOSIS — D631 Anemia in chronic kidney disease: Secondary | ICD-10-CM | POA: Diagnosis not present

## 2022-07-21 DIAGNOSIS — L709 Acne, unspecified: Secondary | ICD-10-CM | POA: Insufficient documentation

## 2022-07-21 DIAGNOSIS — D509 Iron deficiency anemia, unspecified: Secondary | ICD-10-CM | POA: Diagnosis not present

## 2022-07-21 DIAGNOSIS — Z992 Dependence on renal dialysis: Secondary | ICD-10-CM | POA: Diagnosis not present

## 2022-07-21 DIAGNOSIS — E1122 Type 2 diabetes mellitus with diabetic chronic kidney disease: Secondary | ICD-10-CM | POA: Diagnosis not present

## 2022-07-21 DIAGNOSIS — N2581 Secondary hyperparathyroidism of renal origin: Secondary | ICD-10-CM | POA: Diagnosis not present

## 2022-07-21 DIAGNOSIS — N186 End stage renal disease: Secondary | ICD-10-CM | POA: Diagnosis not present

## 2022-07-21 DIAGNOSIS — L299 Pruritus, unspecified: Secondary | ICD-10-CM | POA: Diagnosis not present

## 2022-07-21 DIAGNOSIS — R52 Pain, unspecified: Secondary | ICD-10-CM | POA: Diagnosis not present

## 2022-07-21 DIAGNOSIS — R0981 Nasal congestion: Secondary | ICD-10-CM | POA: Insufficient documentation

## 2022-07-21 DIAGNOSIS — D689 Coagulation defect, unspecified: Secondary | ICD-10-CM | POA: Diagnosis not present

## 2022-07-21 MED ORDER — ALLOPURINOL 100 MG PO TABS: ORAL_TABLET | ORAL | 1 refills | Status: DC

## 2022-07-21 MED ORDER — BUPROPION HCL 100 MG PO TABS: 100.0000 mg | ORAL_TABLET | ORAL | 1 refills | Status: DC

## 2022-07-21 MED ORDER — BENZOYL PEROXIDE 2.5 % EX CREA: 1.0000 [drp] | TOPICAL_CREAM | Freq: Every day | CUTANEOUS | 0 refills | Status: DC

## 2022-07-21 MED ORDER — ATORVASTATIN CALCIUM 20 MG PO TABS: 20.0000 mg | ORAL_TABLET | Freq: Every day | ORAL | 2 refills | Status: DC

## 2022-07-21 MED ORDER — FLUCONAZOLE 150 MG PO TABS: ORAL_TABLET | ORAL | 0 refills | Status: DC

## 2022-07-21 MED ORDER — FLUTICASONE PROPIONATE 50 MCG/ACT NA SUSP: 1.0000 | Freq: Every day | NASAL | 2 refills | Status: AC

## 2022-07-21 MED ORDER — METOPROLOL SUCCINATE ER 25 MG PO TB24: 25.0000 mg | ORAL_TABLET | Freq: Every day | ORAL | 1 refills | Status: DC

## 2022-07-21 NOTE — Assessment & Plan Note (Signed)
Assessment: BP elevated today at 170/91. She denies taking her medications this morning. Discussed importance of adherence and encouraged her to bring up blood pressures at dialysis. She notes after sessions her blood pressure drops significantly. Will reach out to Dr. Hollie Salk about this, they have requested they adjust blood pressure medications.   Plan: - continue amlodipine 10 mg daily and metoprolol succinate 25 mg daily - follow up with Dr. Hollie Salk

## 2022-07-21 NOTE — Progress Notes (Signed)
PCP - Riesa Pope, MD Cardiologist - n/a Nephrology - Dr Madelon Lips  Chest x-ray - n/a EKG - DOS Stress Test - n/a ECHO - 10/22/19 Cardiac Cath - n/a  ICD Pacemaker/Loop - n/a  Sleep Study -  Yes (10/04/12) CPAP - does not use CPAP   Diabetes Type 2, patient does not check blood sugars.  Do not take Mounjaro (hold 7 days prior surg.) on the morning of surgery.  Instructed not to take 07/24/22 dose.    Dialysis on Tues/Thurs/Sat  NPO   Anesthesia review: Yes  STOP now taking any Aspirin (unless otherwise instructed by your surgeon), Aleve, Naproxen, Ibuprofen, Motrin, Advil, Goody's, BC's, all herbal medications, fish oil, and all vitamins.   Coronavirus Screening Do you have any of the following symptoms:  Cough yes/no: No Fever (>100.58F)  yes/no: No Runny nose yes/no: No Sore throat yes/no: No Difficulty breathing/shortness of breath  yes/no: No  Have you traveled in the last 14 days and where? yes/no: No  Patient verbalized understanding of instructions that were given via phone.

## 2022-07-21 NOTE — Assessment & Plan Note (Signed)
Assessment: Continues to have increased stress from multiple events that have occurred over the past few years. In the past she followed with Dr. Theodis Shove but has not met with a counselor ina  few years. Also has stopped taking her wellbutrin. Will refer to [facility] in our clinic and discussed restarting wellbutrin.    Plan: - restart wellbutrin and renally dose, 100 mg every 48 hours - referral placed to [facility]

## 2022-07-21 NOTE — Assessment & Plan Note (Signed)
Assessment: Tolerating dialysis well and not missing sessions  Plan: - Continue dialysis and to follow with [facility]

## 2022-07-21 NOTE — Assessment & Plan Note (Signed)
Assessment: Endorses vaginal itching over the last few days, similar to when she has had a "yeast infection" in the past. Denies any vaginal discharge. No urinary symptoms, on HD.   With feeling similar to prior symptoms, will treat with diflucan x 2 doses. If no improvement she can return for a vaginal swab.   Plan: - diflucan 150 mg every 2 days for 2 doses. Take after dialysis session

## 2022-07-21 NOTE — Assessment & Plan Note (Signed)
Assessment: Nasal congestion for the last few days. Denies any other systemic symptoms. Discussed OTC mucinex and flonase. If begins developing further symptoms instructed to  present for another appointment  Plan: - flonase/mucinex

## 2022-07-21 NOTE — Progress Notes (Signed)
CC: follow up diabetes  HPI:  [name] is a 47 y.o. female living with a history stated below and presents today for follow up of her diabetes and acute concerns of facial lesions and vaginal itching . Please see problem based assessment and plan for additional details.  Past Medical History:  Diagnosis Date   Abdominal muscle pain 09/08/2016   Abnormal Pap smear of cervix 2009   Abscess    history of multiple abscesses   Acute bilateral low back pain 02/13/2017   Acute blood loss anemia    Acute on chronic renal failure ([place]) 07/12/2012   Acute renal failure ([place]) 07/12/2012   Acute respiratory failure with hypoxia ([place]) 10/21/2019   Adjustment disorder with depressed mood 03/26/2017   AKI (acute kidney injury) ([place])    Anemia 10/21/2019   Anemia of chronic disease 2002   Anginal pain (HCC)    Anxiety    Panic attacks   Bilateral lower extremity edema 05/13/2016   Bipolar disorder ([place])    Cataract    B/L cataract   Cellulitis 05/21/2014   right eye   CHF (congestive heart failure) ([place])    Chronic bronchitis ([place])    "get it q yr" (05/13/2013)   Chronic pain    Chronic pain of right knee 09/08/2016   Dehiscence of amputation stump ([place])    Depression    Edema of lower extremity    Endocarditis 2002   subacute bacterial endocarditis.    Fall    Family history of anesthesia complication    "my mom has a hard time coming out from under"   Fibromyalgia    GERD (gastroesophageal reflux disease)    occ   Heart murmur    Herpes simplex type 1 infection 01/16/2018   History of blood transfusion    "just low blood count" (05/13/2013)   Hyperlipidemia    Hypertension    Hypothyroidism    Hypothyroidism, adult 03/21/2014   Hypoxia    Leukocytosis    Muscle spasm 12/16/2020   Necrosis ([place])    and ulceration   Necrotizing fasciitis s/p OR debridements 07/06/2012   Obesity    OSA on CPAP    does not wear all the time   Peripheral neuropathy     Pneumonia    Pyelonephritis 12/31/2020   Routine adult health maintenance 12/08/2016   Severe protein-calorie malnutrition ([place])    Status post below knee amputation, left ([place]) 04/11/2017   Type II diabetes mellitus (HCC)    Type  II   Vaginal irritation 03/14/2016   Weak pulse 07/21/2021   Wound dehiscence 09/04/2019    Current Outpatient Medications on File Prior to Visit  Medication Sig Dispense Refill   amitriptyline (ELAVIL) 100 MG tablet Take 100 mg by mouth at bedtime.     amLODipine (NORVASC) 10 MG tablet TAKE 1 TABLET BY MOUTH EVERY DAY 90 tablet 1   B Complex-C-Folic Acid (DIALYVITE Q000111Q) 0.8 MG TABS Take 0.8 mg by mouth daily.     bismuth subsalicylate (PEPTO BISMOL) 262 MG/15ML suspension Take 30 mLs by mouth every 6 (six) hours as needed for indigestion or diarrhea or loose stools. (Patient not taking: Reported on 12/10/2021)     blood glucose meter kit and supplies KIT ICD 10- E11.69. Based on patient and insurance preference. Use up to four times daily as directed. 1 each 0   calcium acetate (PHOSLO) 667 MG capsule Take 1 capsule (667 mg total) by mouth 3 (three) times  daily with meals. 90 capsule 1   cyclobenzaprine (FLEXERIL) 5 MG tablet Take 1 tablet by mouth daily as needed for muscle spasms. 30 tablet 0   diclofenac Sodium (VOLTAREN) 1 % GEL APPLY 1 GRAM TOPICALLY TO AFFECTED AREA 4 TIMES DAILY AS NEEDED 123XX123 g 2   folic acid-vitamin b complex-vitamin c-selenium-zinc (DIALYVITE) 3 MG TABS tablet Take 1 tablet by mouth daily.     furosemide (LASIX) 20 MG tablet Take 1 tablet by mouth daily. Take on non-dialysis days. Adjust per kidney docs 90 tablet 1   hydrocortisone 2.5 % lotion Apply to affected area 2 times daily for 2-4 weeks 60 mL 0   hydrOXYzine (ATARAX) 25 MG tablet TAKE 1/2 TABLET BY MOUTH EVERY 6 HOURS AS NEEDED FOR ANXIETY 40 tablet 0   Insulin Pen Needle (TRUEPLUS PEN NEEDLES) 31G X 5 MM MISC 1 each by Other route 3 (three) times daily. 100 each 5   ketoconazole  (NIZORAL) 2 % cream Apply 1 application topically to affected area daily. 15 g 2   levothyroxine (SYNTHROID) 88 MCG tablet Take 88 mcg by mouth daily.     mupirocin ointment (BACTROBAN) 2 % APPLY TO AFFECTED AREA(S) DAILY AS NEEDED 22 g 0   oxyCODONE-acetaminophen (PERCOCET/ROXICET) 5-325 MG tablet TAKE 1/2 TABLET BY MOUTH 2 TIMES DAILY AS NEEDED FOR SEVERE PAIN (Patient taking differently: Take 1 tablet by mouth. TAKE 1 TABLET BY MOUTH 2 TIMES DAILY AS NEEDED FOR SEVERE PAIN) 30 tablet 0   sevelamer carbonate (RENVELA) 800 MG tablet Take 1,600 mg by mouth 3 (three) times daily with meals.     No current facility-administered medications on file prior to visit.    Family History  Problem Relation Age of Onset   Heart failure Mother    Diabetes Mother    Kidney disease Mother    Kidney disease Father    Diabetes Father    Diabetes Paternal Grandmother    Heart failure Paternal Grandmother    Other Other     Social History   Socioeconomic History   Marital status: Single    Spouse name: Not on file   Number of children: 0   Years of education: 11   Highest education level: Not on file  Occupational History   Occupation: disability  Tobacco Use   Smoking status: Former    Packs/day: 0.50    Years: 16.00    Total pack years: 8.00    Types: Cigarettes, E-cigarettes    Quit date: 08/2021    Years since quitting: 0.9   Smokeless tobacco: Never   Tobacco comments:    Hasn't had any in 3 days 2-4 times a week   Vaping Use   Vaping Use: Former   Devices: CBD oil  Substance and Sexual Activity   Alcohol use: Yes    Alcohol/week: 0.0 standard drinks of alcohol    Comment: social   Drug use: Yes    Types: Marijuana    Comment: 1 time a week   Sexual activity: Yes  Other Topics Concern   Not on file  Social History Narrative   Lives with mother currently, goddaughter and uncle in a one story home.     On disability for diabetes, neuropathy, sleep apnea etc.  Disability  since 2014.    Education: 11th grade.    Social Determinants of Health   Financial Resource Strain: High Risk (12/06/2021)   Overall Financial Resource Strain (CARDIA)    Difficulty of Paying Living Expenses:  Hard  Food Insecurity: Food Insecurity Present (12/06/2021)   Hunger Vital Sign    Worried About Running Out of Food in the Last Year: Sometimes true    Ran Out of Food in the Last Year: Sometimes true  Transportation Needs: No Transportation Needs (12/10/2021)   PRAPARE - Hydrologist (Medical): No    Lack of Transportation (Non-Medical): No  Recent Concern: Transportation Needs - Unmet Transportation Needs (12/06/2021)   PRAPARE - Transportation    Lack of Transportation (Medical): Yes    Lack of Transportation (Non-Medical): Yes  Physical Activity: Not on file  Stress: Stress Concern Present (12/06/2021)   [place]    Feeling of Stress : Rather much  Social Connections: Moderately Integrated (12/06/2021)   Social Connection and Isolation Panel [NHANES]    Frequency of Communication with Friends and Family: More than three times a week    Frequency of Social Gatherings with Friends and Family: Once a week    Attends Religious Services: 1 to 4 times per year    Active Member of [employer] or Organizations: No    Attends Archivist Meetings: Never    Marital Status: Living with partner  Intimate Partner Violence: Not At Risk (12/06/2021)   Humiliation, Afraid, Rape, and Kick questionnaire    Fear of Current or Ex-Partner: No    Emotionally Abused: No    Physically Abused: No    Sexually Abused: No    Review of Systems: ROS negative except for what is noted on the assessment and plan.  Vitals:   07/20/22 1525 07/20/22 1632  BP: (!) 177/88 (!) 170/91  Pulse: 92 86  Temp: 98.8 F (37.1 C)   TempSrc: Oral   SpO2: 100%   Weight: 290 lb 8 oz (131.8 kg)   Height: 5' 9.5"  (1.765 m)     Physical Exam: Constitutional: in no acute distress HENT: normocephalic atraumatic Eyes: conjunctiva non-erythematous Neck: supple Cardiovascular: regular rate and rhythm, no m/r/g Pulmonary/Chest: normal work of breathing on room air MSK: normal bulk and tone Neurological: alert & oriented x 3 Skin: warm and dry. 1 mm pustule of left chin.  Psych: normal moo.  Assessment & Plan:   Hypertension associated with diabetes ([place]) Assessment: BP elevated today at 170/91. She denies taking her medications this morning. Discussed importance of adherence and encouraged her to bring up blood pressures at dialysis. She notes after sessions her blood pressure drops significantly. Will reach out to Dr. Hollie Salk about this, they have requested they adjust blood pressure medications.   Plan: - continue amlodipine 10 mg daily and metoprolol succinate 25 mg daily - follow up with Dr. Hollie Salk  Type 2 diabetes mellitus ([place]) Assessment: A1c today of 8.0% from 9.5%. Congratulated her on this, she has been doing well with the mounjaro 5 mg which she most recently increased to.   Plan: - continue mounjaro 5 mg weekly - repeat A1c in 3 months  End-stage renal disease on hemodialysis (HCC) Assessment: Tolerating dialysis well and not missing sessions  Plan: - Continue dialysis and to follow with [facility]  Anxiety and depression Assessment: Continues to have increased stress from multiple events that have occurred over the past few years. In the past she followed with Dr. Theodis Shove but has not met with a counselor ina  few years. Also has stopped taking her wellbutrin. Will refer to [facility] in our clinic and discussed restarting wellbutrin.  Plan: - restart wellbutrin and renally dose, 100 mg every 48 hours - referral placed to [facility]  Acne Assessment: Presents with periodic "bumps'' on her face since she was a teenager. They occur on her chin and above her lip/under her nose.  Denies these occurring on mucosal surfaces. They initially will feel like bumps under the skin and if she touches them eventually they will turn into pustules. They occur every few months and are exacerbated by stress. Initially she thought they were a type of herpes virus.   On physical exam she has a pustule on the left aspect of her chin. No other pustules, vesicles, or lesions present. Suspect acne vulgaris. Less likely herpes. With comedones and pustules, will start retinoid cream and benzyl peroxide. She would like to follow with dermatology as well, referral placed.Discussed she has any burning of the skin to stop using the creams and call our office.   Plan: - tretinoin .025% cream and benzyl peroxide - dermatology referral placed.   Vaginal itching Assessment: Endorses vaginal itching over the last few days, similar to when she has had a "yeast infection" in the past. Denies any vaginal discharge. No urinary symptoms, on HD.   With feeling similar to prior symptoms, will treat with diflucan x 2 doses. If no improvement she can return for a vaginal swab.   Plan: - diflucan 150 mg every 2 days for 2 doses. Take after dialysis session  Nasal congestion Assessment: Nasal congestion for the last few days. Denies any other systemic symptoms. Discussed OTC mucinex and flonase. If begins developing further symptoms instructed to  present for another appointment  Plan: - flonase/mucinex  Patient discussed with Dr. Laurena Slimmer, D.O. [facility], PGY-3 Phone: [phone] Date 07/21/2022 Time 11:03 AM

## 2022-07-21 NOTE — Assessment & Plan Note (Signed)
Assessment: A1c today of 8.0% from 9.5%. Congratulated her on this, she has been doing well with the mounjaro 5 mg which she most recently increased to.   Plan: - continue mounjaro 5 mg weekly - repeat A1c in 3 months

## 2022-07-21 NOTE — Assessment & Plan Note (Signed)
Assessment: Presents with periodic "bumps'' on her face since she was a teenager. They occur on her chin and above her lip/under her nose. Denies these occurring on mucosal surfaces. They initially will feel like bumps under the skin and if she touches them eventually they will turn into pustules. They occur every few months and are exacerbated by stress. Initially she thought they were a type of herpes virus.   On physical exam she has a pustule on the left aspect of her chin. No other pustules, vesicles, or lesions present. Suspect acne vulgaris. Less likely herpes. With comedones and pustules, will start retinoid cream and benzyl peroxide. She would like to follow with dermatology as well, referral placed.Discussed she has any burning of the skin to stop using the creams and call our office.   Plan: - tretinoin .025% cream and benzyl peroxide - dermatology referral placed.

## 2022-07-22 DIAGNOSIS — M25562 Pain in left knee: Secondary | ICD-10-CM | POA: Diagnosis not present

## 2022-07-22 DIAGNOSIS — M545 Low back pain, unspecified: Secondary | ICD-10-CM | POA: Diagnosis not present

## 2022-07-22 DIAGNOSIS — G546 Phantom limb syndrome with pain: Secondary | ICD-10-CM | POA: Diagnosis not present

## 2022-07-22 DIAGNOSIS — G894 Chronic pain syndrome: Secondary | ICD-10-CM | POA: Diagnosis not present

## 2022-07-22 DIAGNOSIS — N186 End stage renal disease: Secondary | ICD-10-CM | POA: Diagnosis not present

## 2022-07-22 DIAGNOSIS — Z79891 Long term (current) use of opiate analgesic: Secondary | ICD-10-CM | POA: Diagnosis not present

## 2022-07-22 DIAGNOSIS — Z992 Dependence on renal dialysis: Secondary | ICD-10-CM | POA: Diagnosis not present

## 2022-07-22 DIAGNOSIS — I159 Secondary hypertension, unspecified: Secondary | ICD-10-CM | POA: Diagnosis not present

## 2022-07-22 DIAGNOSIS — M79643 Pain in unspecified hand: Secondary | ICD-10-CM | POA: Diagnosis not present

## 2022-07-22 DIAGNOSIS — G890 Central pain syndrome: Secondary | ICD-10-CM | POA: Diagnosis not present

## 2022-07-22 DIAGNOSIS — M25519 Pain in unspecified shoulder: Secondary | ICD-10-CM | POA: Diagnosis not present

## 2022-07-22 DIAGNOSIS — G4733 Obstructive sleep apnea (adult) (pediatric): Secondary | ICD-10-CM | POA: Diagnosis not present

## 2022-07-22 NOTE — Progress Notes (Signed)
Internal Medicine Clinic Attending  Case discussed with Dr. Katsadouros  At the time of the visit.  We reviewed the resident's history and exam and pertinent patient test results.  I agree with the assessment, diagnosis, and plan of care documented in the resident's note.  

## 2022-07-23 DIAGNOSIS — Z992 Dependence on renal dialysis: Secondary | ICD-10-CM | POA: Diagnosis not present

## 2022-07-23 DIAGNOSIS — D689 Coagulation defect, unspecified: Secondary | ICD-10-CM | POA: Diagnosis not present

## 2022-07-23 DIAGNOSIS — D631 Anemia in chronic kidney disease: Secondary | ICD-10-CM | POA: Diagnosis not present

## 2022-07-23 DIAGNOSIS — N2581 Secondary hyperparathyroidism of renal origin: Secondary | ICD-10-CM | POA: Diagnosis not present

## 2022-07-23 DIAGNOSIS — N186 End stage renal disease: Secondary | ICD-10-CM | POA: Diagnosis not present

## 2022-07-23 DIAGNOSIS — L299 Pruritus, unspecified: Secondary | ICD-10-CM | POA: Diagnosis not present

## 2022-07-23 DIAGNOSIS — D509 Iron deficiency anemia, unspecified: Secondary | ICD-10-CM | POA: Diagnosis not present

## 2022-07-23 DIAGNOSIS — R52 Pain, unspecified: Secondary | ICD-10-CM | POA: Diagnosis not present

## 2022-07-23 NOTE — H&P (Signed)
Date of examination:  07/24/2022  Indication for surgery: Tractional retinal detachment left eye  Pertinent past medical history:  Past Medical History:  Diagnosis Date   Abdominal muscle pain 09/08/2016   Abnormal Pap smear of cervix 2009   Abscess    history of multiple abscesses   Acute bilateral low back pain 02/13/2017   Acute blood loss anemia    Hx  Iron infusions   Acute on chronic renal failure ([place]) 07/12/2012   Acute renal failure ([place]) 07/12/2012   Acute respiratory failure with hypoxia ([place]) 10/21/2019   Adjustment disorder with depressed mood 03/26/2017   AKI (acute kidney injury) ([place])    Dialysis t-th-s   Anemia 10/21/2019   Anemia of chronic disease 2002   Anginal pain (HCC)    Anxiety    Panic attacks   Bilateral lower extremity edema 05/13/2016   Bipolar disorder ([place])    Cataract    B/L cataract   Cellulitis 05/21/2014   right eye   CHF (congestive heart failure) ([place])    Chronic bronchitis ([place])    "get it q yr" (05/13/2013)   Chronic pain    Chronic pain of right knee 09/08/2016   Crutches as ambulation aid    uses forearm crutchs, Left  AKA with prosthetic.   Dehiscence of amputation stump (HCC)    Depression    Edema of lower extremity    Endocarditis 2002   subacute bacterial endocarditis.    Fall    Family history of anesthesia complication    "my mom has a hard time coming out from under"   Fibromyalgia    GERD (gastroesophageal reflux disease)    occ   Heart murmur    never has had any problems   Herpes simplex type 1 infection 01/16/2018   History of blood transfusion    "just low blood count" (05/13/2013)   Hyperlipidemia    Hypertension    Hypothyroidism    Hypothyroidism, adult 03/21/2014   Hypoxia    Leukocytosis    Muscle spasm 12/16/2020   Necrosis (HCC)    and ulceration   Necrotizing fasciitis s/p OR debridements 07/06/2012   Obesity    OSA on CPAP    does not use CPAP   Peripheral neuropathy    Pneumonia     Pyelonephritis 12/31/2020   Routine adult health maintenance 12/08/2016   Severe protein-calorie malnutrition ([place])    Status post below knee amputation, left ([place]) 04/11/2017   Type II diabetes mellitus ([place])    Type  II   Vaginal irritation 03/14/2016   Weak pulse 07/21/2021   Wound dehiscence 09/04/2019    Pertinent ocular history:  proliferative diabetic retinopathy  Pertinent family history:  Family History  Problem Relation Age of Onset   Heart failure Mother    Diabetes Mother    Kidney disease Mother    Kidney disease Father    Diabetes Father    Diabetes Paternal Grandmother    Heart failure Paternal Grandmother    Other Other     General:  Healthy appearing patient in no distress.     Eyes:    Acuity OS HM      External: Within normal limits      Anterior segment: Within normal limits        Fundus: Nor view - B-scan - VH with traction    Impression: Proliferative diabetic retinopathy with tractional retinal detachment left eye  Plan:  Tractional retinal detachment repair left eye  Jalene Mullet, MD

## 2022-07-23 NOTE — Anesthesia Preprocedure Evaluation (Addendum)
Anesthesia Evaluation  Patient identified by MRN, date of birth, ID band Patient awake    Reviewed: Allergy & Precautions, NPO status , Patient's Chart, lab work & pertinent test results, reviewed documented beta blocker date and time   History of Anesthesia Complications Negative for: history of anesthetic complications  Airway Mallampati: III  TM Distance: >3 FB Neck ROM: Full    Dental  (+) Dental Advisory Given, Chipped   Pulmonary sleep apnea and Continuous Positive Airway Pressure Ventilation , former smoker   Pulmonary exam normal        Cardiovascular hypertension, Pt. on medications and Pt. on home beta blockers Normal cardiovascular exam   '21 TTE - EF 60 to 65%. Left and right atrial size was mildly dilated. Trivial mitral valve regurgitation.     Neuro/Psych  PSYCHIATRIC DISORDERS Anxiety Depression Bipolar Disorder   negative neurological ROS     GI/Hepatic ,GERD  Controlled,,(+)     substance abuse  marijuana use  Endo/Other  diabetes, Type 2, Insulin DependentHypothyroidism  Morbid obesity On GLP-1a, last dose 8 days ago   Renal/GU ESRF and DialysisRenal disease     Musculoskeletal  (+)  Fibromyalgia -  Abdominal  (+) + obese  Peds  Hematology negative hematology ROS (+)   Anesthesia Other Findings HSV   Reproductive/Obstetrics                             Anesthesia Physical Anesthesia Plan  ASA: 3  Anesthesia Plan: MAC   Post-op Pain Management: Tylenol PO (pre-op)* and Minimal or no pain anticipated   Induction:   PONV Risk Score and Plan: 1 and Treatment may vary due to age or medical condition and Propofol infusion  Airway Management Planned: Natural Airway and Nasal Cannula  Additional Equipment: None  Intra-op Plan:   Post-operative Plan:   Informed Consent: I have reviewed the patients History and Physical, chart, labs and discussed the procedure  including the risks, benefits and alternatives for the proposed anesthesia with the patient or authorized representative who has indicated his/her understanding and acceptance.       Plan Discussed with: CRNA, Anesthesiologist and Surgeon  Anesthesia Plan Comments:         Anesthesia Quick Evaluation

## 2022-07-24 ENCOUNTER — Encounter (HOSPITAL_COMMUNITY)
Admission: RE | Disposition: A | Discharge status: Home / Self Care | Payer: Self-pay | Source: Home / Self Care | Attending: Ophthalmology

## 2022-07-24 ENCOUNTER — Encounter (HOSPITAL_COMMUNITY): Payer: Self-pay | Admitting: Ophthalmology

## 2022-07-24 ENCOUNTER — Ambulatory Visit (HOSPITAL_BASED_OUTPATIENT_CLINIC_OR_DEPARTMENT_OTHER): Payer: 59 | Admitting: Physician Assistant

## 2022-07-24 ENCOUNTER — Ambulatory Visit (HOSPITAL_COMMUNITY): Payer: 59 | Admitting: Physician Assistant

## 2022-07-24 ENCOUNTER — Ambulatory Visit (HOSPITAL_COMMUNITY)
Admission: RE | Admit: 2022-07-24 | Discharge: 2022-07-24 | Disposition: A | Discharge status: Home / Self Care | Payer: 59 | Attending: Ophthalmology | Admitting: Ophthalmology

## 2022-07-24 ENCOUNTER — Other Ambulatory Visit: Payer: Self-pay

## 2022-07-24 DIAGNOSIS — H3322 Serous retinal detachment, left eye: Secondary | ICD-10-CM | POA: Diagnosis not present

## 2022-07-24 DIAGNOSIS — Z992 Dependence on renal dialysis: Secondary | ICD-10-CM

## 2022-07-24 DIAGNOSIS — E113522 Type 2 diabetes mellitus with proliferative diabetic retinopathy with traction retinal detachment involving the macula, left eye: Secondary | ICD-10-CM | POA: Diagnosis not present

## 2022-07-24 DIAGNOSIS — F319 Bipolar disorder, unspecified: Secondary | ICD-10-CM | POA: Insufficient documentation

## 2022-07-24 DIAGNOSIS — G4733 Obstructive sleep apnea (adult) (pediatric): Secondary | ICD-10-CM | POA: Diagnosis not present

## 2022-07-24 DIAGNOSIS — K219 Gastro-esophageal reflux disease without esophagitis: Secondary | ICD-10-CM | POA: Diagnosis not present

## 2022-07-24 DIAGNOSIS — Z6841 Body Mass Index (BMI) 40.0 and over, adult: Secondary | ICD-10-CM | POA: Insufficient documentation

## 2022-07-24 DIAGNOSIS — Z09 Encounter for follow-up examination after completed treatment for conditions other than malignant neoplasm: Secondary | ICD-10-CM | POA: Insufficient documentation

## 2022-07-24 DIAGNOSIS — I132 Hypertensive heart and chronic kidney disease with heart failure and with stage 5 chronic kidney disease, or end stage renal disease: Secondary | ICD-10-CM | POA: Diagnosis not present

## 2022-07-24 DIAGNOSIS — Z794 Long term (current) use of insulin: Secondary | ICD-10-CM

## 2022-07-24 DIAGNOSIS — I120 Hypertensive chronic kidney disease with stage 5 chronic kidney disease or end stage renal disease: Secondary | ICD-10-CM

## 2022-07-24 DIAGNOSIS — I509 Heart failure, unspecified: Secondary | ICD-10-CM | POA: Insufficient documentation

## 2022-07-24 DIAGNOSIS — Z87891 Personal history of nicotine dependence: Secondary | ICD-10-CM | POA: Diagnosis not present

## 2022-07-24 DIAGNOSIS — N186 End stage renal disease: Secondary | ICD-10-CM | POA: Diagnosis not present

## 2022-07-24 DIAGNOSIS — M797 Fibromyalgia: Secondary | ICD-10-CM | POA: Insufficient documentation

## 2022-07-24 DIAGNOSIS — Z7985 Long-term (current) use of injectable non-insulin antidiabetic drugs: Secondary | ICD-10-CM | POA: Insufficient documentation

## 2022-07-24 DIAGNOSIS — E1122 Type 2 diabetes mellitus with diabetic chronic kidney disease: Secondary | ICD-10-CM

## 2022-07-24 DIAGNOSIS — Z9989 Dependence on other enabling machines and devices: Secondary | ICD-10-CM | POA: Diagnosis not present

## 2022-07-24 HISTORY — PX: PHOTOCOAGULATION: SHX5303

## 2022-07-24 HISTORY — PX: REPAIR OF COMPLEX TRACTION RETINAL DETACHMENT: SHX6217

## 2022-07-24 HISTORY — PX: MEMBRANE PEEL: SHX5967

## 2022-07-24 HISTORY — DX: Dependence on other enabling machines and devices: Z99.89

## 2022-07-24 HISTORY — PX: PARS PLANA VITRECTOMY: SHX2166

## 2022-07-24 LAB — POCT I-STAT, CHEM 8
BUN: 42 mg/dL — ABNORMAL HIGH (ref 6–20)
Calcium, Ion: 0.95 mmol/L — ABNORMAL LOW (ref 1.15–1.40)
Chloride: 99 mmol/L (ref 98–111)
Creatinine, Ser: 6.7 mg/dL — ABNORMAL HIGH (ref 0.44–1.00)
Glucose, Bld: 213 mg/dL — ABNORMAL HIGH (ref 70–99)
HCT: 35 % — ABNORMAL LOW (ref 36.0–46.0)
Hemoglobin: 11.9 g/dL — ABNORMAL LOW (ref 12.0–15.0)
Potassium: 4.6 mmol/L (ref 3.5–5.1)
Sodium: 137 mmol/L (ref 135–145)
TCO2: 34 mmol/L — ABNORMAL HIGH (ref 22–32)

## 2022-07-24 LAB — GLUCOSE, CAPILLARY: Glucose-Capillary: 175 mg/dL — ABNORMAL HIGH (ref 70–99)

## 2022-07-24 LAB — POCT PREGNANCY, URINE: Preg Test, Ur: NEGATIVE

## 2022-07-24 SURGERY — REPAIR, RETINAL DETACHMENT, COMPLEX
Anesthesia: Monitor Anesthesia Care | Site: Eye | Laterality: Left

## 2022-07-24 MED ORDER — ACETAMINOPHEN 500 MG PO TABS
1000.0000 mg | ORAL_TABLET | Freq: Once | ORAL | Status: AC
  Administered 2022-07-24: 1000 mg via ORAL
  Filled 2022-07-24: qty 2

## 2022-07-24 MED ORDER — BSS PLUS IO SOLN
INTRAOCULAR | Status: AC
  Filled 2022-07-24: qty 500

## 2022-07-24 MED ORDER — PROPOFOL 10 MG/ML IV BOLUS
INTRAVENOUS | Status: DC | PRN
  Administered 2022-07-24 (×2): 20 mg via INTRAVENOUS

## 2022-07-24 MED ORDER — PHENYLEPHRINE 80 MCG/ML (10ML) SYRINGE FOR IV PUSH (FOR BLOOD PRESSURE SUPPORT)
PREFILLED_SYRINGE | INTRAVENOUS | Status: AC
  Filled 2022-07-24: qty 10

## 2022-07-24 MED ORDER — EPINEPHRINE PF 1 MG/ML IJ SOLN: INTRAOCULAR | Status: DC | PRN

## 2022-07-24 MED ORDER — CHLORHEXIDINE GLUCONATE 0.12 % MT SOLN
15.0000 mL | Freq: Once | OROMUCOSAL | Status: AC
  Filled 2022-07-24: qty 15

## 2022-07-24 MED ORDER — ROCURONIUM BROMIDE 10 MG/ML (PF) SYRINGE
PREFILLED_SYRINGE | INTRAVENOUS | Status: AC
  Filled 2022-07-24: qty 10

## 2022-07-24 MED ORDER — EPINEPHRINE PF 1 MG/ML IJ SOLN
INTRAMUSCULAR | Status: AC
  Filled 2022-07-24: qty 1

## 2022-07-24 MED ORDER — HYDROMORPHONE HCL 1 MG/ML IJ SOLN
0.5000 mg | INTRAMUSCULAR | Status: DC | PRN
  Administered 2022-07-24: 0.5 mg via INTRAVENOUS

## 2022-07-24 MED ORDER — MIDAZOLAM HCL 2 MG/2ML IJ SOLN
INTRAMUSCULAR | Status: AC
  Filled 2022-07-24: qty 2

## 2022-07-24 MED ORDER — BUPIVACAINE HCL (PF) 0.75 % IJ SOLN
INTRAMUSCULAR | Status: AC
  Filled 2022-07-24: qty 10

## 2022-07-24 MED ORDER — TOBRAMYCIN-DEXAMETHASONE 0.3-0.1 % OP OINT
TOPICAL_OINTMENT | OPHTHALMIC | Status: AC
  Filled 2022-07-24: qty 3.5

## 2022-07-24 MED ORDER — HYALURONIDASE HUMAN 150 UNIT/ML IJ SOLN
INTRAMUSCULAR | Status: AC
  Filled 2022-07-24: qty 1

## 2022-07-24 MED ORDER — FENTANYL CITRATE (PF) 100 MCG/2ML IJ SOLN: 25.0000 ug | INTRAMUSCULAR | Status: DC | PRN

## 2022-07-24 MED ORDER — ONDANSETRON HCL 4 MG/2ML IJ SOLN
INTRAMUSCULAR | Status: AC
  Filled 2022-07-24: qty 2

## 2022-07-24 MED ORDER — LIDOCAINE 2% (20 MG/ML) 5 ML SYRINGE
INTRAMUSCULAR | Status: DC | PRN
  Administered 2022-07-24: 40 mg via INTRAVENOUS

## 2022-07-24 MED ORDER — SODIUM HYALURONATE 10 MG/ML IO SOLUTION
PREFILLED_SYRINGE | INTRAOCULAR | Status: DC | PRN
  Administered 2022-07-24: .85 mL via INTRAOCULAR

## 2022-07-24 MED ORDER — PROMETHAZINE HCL 25 MG/ML IJ SOLN: 6.2500 mg | INTRAMUSCULAR | Status: DC | PRN

## 2022-07-24 MED ORDER — DEXAMETHASONE SODIUM PHOSPHATE 10 MG/ML IJ SOLN
INTRAMUSCULAR | Status: AC
  Filled 2022-07-24: qty 1

## 2022-07-24 MED ORDER — PHENYLEPHRINE HCL 2.5 % OP SOLN
1.0000 [drp] | OPHTHALMIC | Status: AC | PRN
  Administered 2022-07-24 (×3): 1 [drp] via OPHTHALMIC
  Filled 2022-07-24: qty 2

## 2022-07-24 MED ORDER — INSULIN ASPART 100 UNIT/ML IJ SOLN
0.0000 [IU] | INTRAMUSCULAR | Status: DC | PRN
  Administered 2022-07-24: 2 [IU] via SUBCUTANEOUS

## 2022-07-24 MED ORDER — LIDOCAINE 2% (20 MG/ML) 5 ML SYRINGE
INTRAMUSCULAR | Status: AC
  Filled 2022-07-24: qty 5

## 2022-07-24 MED ORDER — OXYCODONE HCL 5 MG PO TABS: 5.0000 mg | ORAL_TABLET | Freq: Once | ORAL | Status: DC | PRN

## 2022-07-24 MED ORDER — FENTANYL CITRATE (PF) 250 MCG/5ML IJ SOLN
INTRAMUSCULAR | Status: AC
  Filled 2022-07-24: qty 5

## 2022-07-24 MED ORDER — ORAL CARE MOUTH RINSE: 15.0000 mL | Freq: Once | OROMUCOSAL | Status: AC

## 2022-07-24 MED ORDER — OFLOXACIN 0.3 % OP SOLN
1.0000 [drp] | OPHTHALMIC | Status: AC | PRN
  Administered 2022-07-24 (×2): 1 [drp] via OPHTHALMIC
  Filled 2022-07-24: qty 5

## 2022-07-24 MED ORDER — BSS IO SOLN
INTRAOCULAR | Status: AC
  Filled 2022-07-24: qty 15

## 2022-07-24 MED ORDER — TETRACAINE HCL 0.5 % OP SOLN
OPHTHALMIC | Status: AC
  Filled 2022-07-24: qty 4

## 2022-07-24 MED ORDER — BSS IO SOLN
INTRAOCULAR | Status: DC | PRN
  Administered 2022-07-24: 15 mL

## 2022-07-24 MED ORDER — CEFAZOLIN SUBCONJUNCTIVAL INJECTION 100 MG/0.5 ML
INJECTION | SUBCONJUNCTIVAL | Status: DC | PRN
  Administered 2022-07-24: 100 mg via SUBCONJUNCTIVAL

## 2022-07-24 MED ORDER — DEXAMETHASONE SODIUM PHOSPHATE 10 MG/ML IJ SOLN
INTRAMUSCULAR | Status: DC | PRN
  Administered 2022-07-24: 10 mg via INTRAVENOUS

## 2022-07-24 MED ORDER — MIDAZOLAM HCL 2 MG/2ML IJ SOLN
INTRAMUSCULAR | Status: DC | PRN
  Administered 2022-07-24 (×2): 1 mg via INTRAVENOUS

## 2022-07-24 MED ORDER — PROPOFOL 10 MG/ML IV BOLUS
INTRAVENOUS | Status: AC
  Filled 2022-07-24: qty 20

## 2022-07-24 MED ORDER — CEFAZOLIN SUBCONJUNCTIVAL INJECTION 100 MG/0.5 ML
100.0000 mg | INJECTION | SUBCONJUNCTIVAL | Status: DC
  Filled 2022-07-24: qty 5

## 2022-07-24 MED ORDER — SODIUM CHLORIDE 0.9 % IV SOLN: INTRAVENOUS | Status: DC

## 2022-07-24 MED ORDER — SUCCINYLCHOLINE CHLORIDE 200 MG/10ML IV SOSY
PREFILLED_SYRINGE | INTRAVENOUS | Status: AC
  Filled 2022-07-24: qty 10

## 2022-07-24 MED ORDER — SODIUM HYALURONATE 10 MG/ML IO SOLUTION
PREFILLED_SYRINGE | INTRAOCULAR | Status: AC
  Filled 2022-07-24: qty 0.85

## 2022-07-24 MED ORDER — LIDOCAINE HCL 2 % IJ SOLN
INTRAMUSCULAR | Status: AC
  Filled 2022-07-24: qty 20

## 2022-07-24 MED ORDER — TOBRAMYCIN-DEXAMETHASONE 0.3-0.1 % OP OINT
TOPICAL_OINTMENT | OPHTHALMIC | Status: DC | PRN
  Administered 2022-07-24: 1 via OPHTHALMIC

## 2022-07-24 MED ORDER — ONDANSETRON HCL 4 MG/2ML IJ SOLN
INTRAMUSCULAR | Status: DC | PRN
  Administered 2022-07-24: 4 mg via INTRAVENOUS

## 2022-07-24 MED ORDER — PHENYLEPHRINE HCL 10 % OP SOLN
1.0000 [drp] | OPHTHALMIC | Status: AC | PRN
  Administered 2022-07-24: 1 [drp] via OPHTHALMIC
  Filled 2022-07-24: qty 5

## 2022-07-24 MED ORDER — FENTANYL CITRATE (PF) 250 MCG/5ML IJ SOLN
INTRAMUSCULAR | Status: DC | PRN
  Administered 2022-07-24: 25 ug via INTRAVENOUS

## 2022-07-24 MED ORDER — PROPARACAINE HCL 0.5 % OP SOLN
1.0000 [drp] | OPHTHALMIC | Status: AC | PRN
  Administered 2022-07-24 (×2): 1 [drp] via OPHTHALMIC
  Filled 2022-07-24: qty 15

## 2022-07-24 MED ORDER — TROPICAMIDE 1 % OP SOLN
1.0000 [drp] | OPHTHALMIC | Status: AC | PRN
  Administered 2022-07-24 (×3): 1 [drp] via OPHTHALMIC
  Filled 2022-07-24: qty 15

## 2022-07-24 MED ORDER — LIDOCAINE HCL 2 % IJ SOLN
INTRAMUSCULAR | Status: DC | PRN
  Administered 2022-07-24: 5 mL via RETROBULBAR

## 2022-07-24 MED ORDER — OXYCODONE HCL 5 MG/5ML PO SOLN: 5.0000 mg | Freq: Once | ORAL | Status: DC | PRN

## 2022-07-24 MED ORDER — CHLORHEXIDINE GLUCONATE 0.12 % MT SOLN
OROMUCOSAL | Status: AC
  Administered 2022-07-24: 15 mL via OROMUCOSAL
  Filled 2022-07-24: qty 15

## 2022-07-24 MED ORDER — HYDROMORPHONE HCL 1 MG/ML IJ SOLN
INTRAMUSCULAR | Status: AC
  Filled 2022-07-24: qty 1

## 2022-07-24 SURGICAL SUPPLY — 39 items
APL SWBSTK 6 STRL LF DISP (MISCELLANEOUS) ×1
APPLICATOR COTTON TIP 6 STRL (MISCELLANEOUS) ×2 IMPLANT
APPLICATOR COTTON TIP 6IN STRL (MISCELLANEOUS) ×1 IMPLANT
CANNULA VLV SOFT TIP 25G (OPHTHALMIC) ×2 IMPLANT
CANNULA VLV SOFT TIP 25GA (OPHTHALMIC) ×1 IMPLANT
CLSR STERI-STRIP ANTIMIC 1/2X4 (GAUZE/BANDAGES/DRESSINGS) ×2 IMPLANT
COVER MAYO STAND STRL (DRAPES) IMPLANT
DRAPE HALF SHEET 40X57 (DRAPES) ×2 IMPLANT
DRAPE RETRACTOR (MISCELLANEOUS) ×2 IMPLANT
FORCEPS GRIESHABER MAX 25G (MISCELLANEOUS) IMPLANT
GLOVE SURG SYN 7.5  E (GLOVE) ×1
GLOVE SURG SYN 7.5 E (GLOVE) ×1 IMPLANT
GLOVE SURG SYN 7.5 PF PI (GLOVE) ×2 IMPLANT
GOWN STRL REUS W/ TWL LRG LVL3 (GOWN DISPOSABLE) ×2 IMPLANT
GOWN STRL REUS W/TWL LRG LVL3 (GOWN DISPOSABLE) ×1
KIT BASIN OR (CUSTOM PROCEDURE TRAY) ×2 IMPLANT
KIT TURNOVER KIT B (KITS) ×2 IMPLANT
LENS BIOM SUPER VIEW SET DISP (MISCELLANEOUS) ×2 IMPLANT
NDL 18GX1X1/2 (RX/OR ONLY) (NEEDLE) ×2 IMPLANT
NDL 25GX 5/8IN NON SAFETY (NEEDLE) ×2 IMPLANT
NDL FILTER BLUNT 18X1 1/2 (NEEDLE) ×2 IMPLANT
NDL RETROBULBAR 25GX1.5 (NEEDLE) ×2 IMPLANT
NEEDLE 18GX1X1/2 (RX/OR ONLY) (NEEDLE) ×1 IMPLANT
NEEDLE 25GX 5/8IN NON SAFETY (NEEDLE) ×1 IMPLANT
NEEDLE FILTER BLUNT 18X1 1/2 (NEEDLE) ×1 IMPLANT
NEEDLE RETROBULBAR 25GX1.5 (NEEDLE) ×1 IMPLANT
NS IRRIG 1000ML POUR BTL (IV SOLUTION) ×2 IMPLANT
OIL SILICONE OPHTHALMIC 1000 (Ophthalmic Related) IMPLANT
PACK VITRECTOMY CUSTOM (CUSTOM PROCEDURE TRAY) ×2 IMPLANT
PAD ARMBOARD 7.5X6 YLW CONV (MISCELLANEOUS) ×4 IMPLANT
PAK PIK VITRECTOMY CVS 25GA (OPHTHALMIC) ×2 IMPLANT
PIC ILLUMINATED 25G (OPHTHALMIC) ×1
PIK ILLUMINATED 25G (OPHTHALMIC) IMPLANT
SET INJECTOR OIL FLUID CONSTEL (OPHTHALMIC) IMPLANT
SOL ANTI FOG 6CC (MISCELLANEOUS) ×2 IMPLANT
SYR 10ML LL (SYRINGE) IMPLANT
SYR TB 1ML LUER SLIP (SYRINGE) IMPLANT
WATER STERILE IRR 1000ML POUR (IV SOLUTION) ×2 IMPLANT
WIPE INSTRUMENT VISIWIPE 73X73 (MISCELLANEOUS) IMPLANT

## 2022-07-24 NOTE — Op Note (Signed)
[name] [name] 07/24/2022 Diagnosis: Proliferative diabetic retinopathy with tractional retinal detachment involving the macula left eye  Procedure: REPAIR OF COMPLEX TRACTION RETINAL DETACHMENT (Left) PARS PLANA VITRECTOMY WITH 25 GAUGE (Left) MEMBRANE PEEL (Left) MEMBRANECTOMY (Left) AIR FLUID EXCHANGE AND DRAINAGE OF SUBRETINAL FLUID (Left) AIRGAS, SILICONE OIL AND PHOTOCOAGULATION (Left)   Operative Eye:  left eye  Surgeon: Royston Cowper Estimated Blood Loss: minimal Specimens for Pathology:  None Complications: none   After informed consent was obtained, the patient was brought to the operating room and a time-out confirmed the correct operative eye as the left eye. Retrobulbar anesthesia was obtained in the left eye without complication  The  patient was prepped and draped in the usual fashion for ocular surgery on the  left eye .  A lid speculum was placed.  Infusion line and trocar was placed at the 4 o'clock position approximately 3.5 mm from the surgical limbus.   The infusion line was allowed to run and then clamped when placed at the cannula opening. The line was inserted and secured to the drape with an adhesive strip.   Active trocars/cannula were placed at the 10 and 2 o'clock positions approximately 3.5 mm from the surgical limbus. The cannula was visualized in the vitreous cavity.  The light pipe and vitreous cutter were inserted into the vitreous cavity and a core vitrectomy was performed.  Care taken to remove the vitreous up to the vitreous base for 360 degrees.  There was a large preretinal hemorrhage with significant overlying gliosis and fibrosis.    Attention was directed toward relieving the tractional detachment from the posterior pole in particular peripherally (temporally and nasally). This was done carefully at the disc and surrounding nasal arcades. There was notable neovascular fronds with traction and associated hemorrhage nasally. Care was taken to  elevate the membranes and remove them both with a vitrector and a lighted pik with dissection. Hemostasis of the neovascular fronds was performed with endocautery and endolaser. Following hemostasis, continued dissection of membranes and removal of membranes was performed including the nasal and temporal retina. Following membranectomy, endolaser was applied to the areas where the neovascular fronds were still present.  3 rows of endolaser were applied 360 degrees to the periphery.  A complete air-fluid exchange was then performed.  [id number] centistoke silicone oil was place into the eye to attain a near complete fill. The trocars were sequentially removed and all were noted to be sealed. Subconjunctival injections of Ancef and Decadron were placed.   The speculum and drapes were removed and the eye was patched with Polymixin/Bacitracin ophthalmic ointment. An eye shield was placed and the patient was transferred alert and conversant with stable vital signs to the post operative recovery area.  The patient tolerated the procedure well and no complications were noted.    Royston Cowper MD

## 2022-07-24 NOTE — Brief Op Note (Signed)
07/24/2022  10:07 AM  PATIENT:  [name] [name]  47 y.o. female  PRE-OPERATIVE DIAGNOSIS:  TYPE 2 DIABETES WITH PROLIFERATIVE DIABETIC RETINOPATHY WITH TRACTION RETINAL DETACHMENT INVOLVING THE MACULA LEFT EYE  POST-OPERATIVE DIAGNOSIS:  TYPE 2 DIABETES WITH PROLIFERATIVE DIABETIC RETINOPATHY WITH TRACTION RETINAL DETACHMENT INVOLVING THE MACULA LEFT EYE  PROCEDURE:  Procedure(s): REPAIR OF COMPLEX TRACTION RETINAL DETACHMENT (Left) PARS PLANA VITRECTOMY WITH 25 GAUGE (Left) MEMBRANE PEEL (Left) MEMBRANECTOMY (Left) AIR FLUID EXCHANGE AND DRAINAGE OF SUBRETINAL FLUID (Left) AIRGAS, SILICONE OIL AND PHOTOCOAGULATION (Left)  SURGEON:  Surgeon(s) and Role:    * Jalene Mullet, MD - Primary  PHYSICIAN ASSISTANT:   ASSISTANTS: none   ANESTHESIA:   local and MAC  EBL:  minimal   BLOOD ADMINISTERED:none  DRAINS: none   LOCAL MEDICATIONS USED:  MARCAINE    and LIDOCAINE   SPECIMEN:  No Specimen  DISPOSITION OF SPECIMEN:  N/A  COUNTS:  YES  TOURNIQUET:  * No tourniquets in log *  DICTATION: .Note written in EPIC  PLAN OF CARE: Discharge to home after PACU  PATIENT DISPOSITION:  PACU - hemodynamically stable.   Delay start of Pharmacological VTE agent (>24hrs) due to surgical blood loss or risk of bleeding: not applicable

## 2022-07-24 NOTE — Progress Notes (Signed)
Pt ready to be discharged, currently waiting on her ride to arrive.

## 2022-07-24 NOTE — Transfer of Care (Signed)
Immediate Anesthesia Transfer of Care Note  Patient: [name] [name]  Procedure(s) Performed: REPAIR OF COMPLEX TRACTION RETINAL DETACHMENT (Left) PARS PLANA VITRECTOMY WITH 25 GAUGE (Left) MEMBRANE PEEL (Left) AIRGAS, SILICONE OIL AND PHOTOCOAGULATION (Left)  Patient Location: PACU  Anesthesia Type:MAC  Level of Consciousness: awake, alert , oriented, and patient cooperative  Airway & Oxygen Therapy: Patient Spontanous Breathing  Post-op Assessment: Report given to RN and Post -op Vital signs reviewed and stable  Post vital signs: Reviewed and stable  Last Vitals:  Vitals Value Taken Time  BP 128/82 07/24/22 1015  Temp    Pulse 81 07/24/22 1017  Resp 18 07/24/22 1017  SpO2 100 % 07/24/22 1017  Vitals shown include unvalidated device data.  Last Pain:  Vitals:   07/24/22 0728  TempSrc: Oral         Complications: No notable events documented.

## 2022-07-24 NOTE — Anesthesia Postprocedure Evaluation (Signed)
Anesthesia Post Note  Patient: [name] [name]  Procedure(s) Performed: REPAIR OF COMPLEX TRACTION RETINAL DETACHMENT (Left: Eye) PARS PLANA VITRECTOMY WITH 25 GAUGE (Left: Eye) MEMBRANE PEEL (Left: Eye) AIRGAS, SILICONE OIL, PHOTOCOAGULATION (Left: Eye)     Patient location during evaluation: PACU Anesthesia Type: MAC Level of consciousness: awake and alert Pain management: pain level controlled Vital Signs Assessment: post-procedure vital signs reviewed and stable Respiratory status: spontaneous breathing, nonlabored ventilation and respiratory function stable Cardiovascular status: stable and blood pressure returned to baseline Anesthetic complications: no   No notable events documented.  Last Vitals:  Vitals:   07/24/22 1030 07/24/22 1045  BP: (!) 145/86 (!) 142/79  Pulse: 83 83  Resp: (!) 26 11  Temp:    SpO2: 100% 96%    Last Pain:  Vitals:   07/24/22 1030  TempSrc:   PainSc: [place]

## 2022-07-24 NOTE — Discharge Instructions (Addendum)
DO NOT SLEEP ON BACK,   SLEEP ON SIDE WITH NOSE TO PILLOW  DURING DAY KEEP UPRIGHT

## 2022-07-26 DIAGNOSIS — D689 Coagulation defect, unspecified: Secondary | ICD-10-CM | POA: Diagnosis not present

## 2022-07-26 DIAGNOSIS — L299 Pruritus, unspecified: Secondary | ICD-10-CM | POA: Diagnosis not present

## 2022-07-26 DIAGNOSIS — Z992 Dependence on renal dialysis: Secondary | ICD-10-CM | POA: Diagnosis not present

## 2022-07-26 DIAGNOSIS — R52 Pain, unspecified: Secondary | ICD-10-CM | POA: Diagnosis not present

## 2022-07-26 DIAGNOSIS — D631 Anemia in chronic kidney disease: Secondary | ICD-10-CM | POA: Diagnosis not present

## 2022-07-26 DIAGNOSIS — N2581 Secondary hyperparathyroidism of renal origin: Secondary | ICD-10-CM | POA: Diagnosis not present

## 2022-07-26 DIAGNOSIS — D509 Iron deficiency anemia, unspecified: Secondary | ICD-10-CM | POA: Diagnosis not present

## 2022-07-26 DIAGNOSIS — N186 End stage renal disease: Secondary | ICD-10-CM | POA: Diagnosis not present

## 2022-07-27 ENCOUNTER — Telehealth: Payer: Self-pay | Admitting: *Deleted

## 2022-07-27 DIAGNOSIS — Z992 Dependence on renal dialysis: Secondary | ICD-10-CM | POA: Diagnosis not present

## 2022-07-27 DIAGNOSIS — N186 End stage renal disease: Secondary | ICD-10-CM | POA: Diagnosis not present

## 2022-07-27 DIAGNOSIS — I871 Compression of vein: Secondary | ICD-10-CM | POA: Diagnosis not present

## 2022-07-27 DIAGNOSIS — T82858A Stenosis of vascular prosthetic devices, implants and grafts, initial encounter: Secondary | ICD-10-CM | POA: Diagnosis not present

## 2022-07-27 NOTE — Progress Notes (Signed)
  Care Coordination  Outreach Note  07/27/2022 Name: [name] [name] MRN: [id number] DOB: 31-May-1975   Care Coordination Outreach Attempts: An unsuccessful telephone outreach was attempted today to offer the patient information about available care coordination services as a benefit of their health plan.   Follow Up Plan:  Additional outreach attempts will be made to offer the patient care coordination information and services.   Encounter Outcome:  No Answer  [place]  Direct Dial: [phone]

## 2022-07-28 DIAGNOSIS — L299 Pruritus, unspecified: Secondary | ICD-10-CM | POA: Diagnosis not present

## 2022-07-28 DIAGNOSIS — N2581 Secondary hyperparathyroidism of renal origin: Secondary | ICD-10-CM | POA: Diagnosis not present

## 2022-07-28 DIAGNOSIS — Z992 Dependence on renal dialysis: Secondary | ICD-10-CM | POA: Diagnosis not present

## 2022-07-28 DIAGNOSIS — D509 Iron deficiency anemia, unspecified: Secondary | ICD-10-CM | POA: Diagnosis not present

## 2022-07-28 DIAGNOSIS — R52 Pain, unspecified: Secondary | ICD-10-CM | POA: Diagnosis not present

## 2022-07-28 DIAGNOSIS — D689 Coagulation defect, unspecified: Secondary | ICD-10-CM | POA: Diagnosis not present

## 2022-07-28 DIAGNOSIS — D631 Anemia in chronic kidney disease: Secondary | ICD-10-CM | POA: Diagnosis not present

## 2022-07-28 DIAGNOSIS — N186 End stage renal disease: Secondary | ICD-10-CM | POA: Diagnosis not present

## 2022-07-30 DIAGNOSIS — D509 Iron deficiency anemia, unspecified: Secondary | ICD-10-CM | POA: Diagnosis not present

## 2022-07-30 DIAGNOSIS — R52 Pain, unspecified: Secondary | ICD-10-CM | POA: Diagnosis not present

## 2022-07-30 DIAGNOSIS — D689 Coagulation defect, unspecified: Secondary | ICD-10-CM | POA: Diagnosis not present

## 2022-07-30 DIAGNOSIS — L299 Pruritus, unspecified: Secondary | ICD-10-CM | POA: Diagnosis not present

## 2022-07-30 DIAGNOSIS — Z992 Dependence on renal dialysis: Secondary | ICD-10-CM | POA: Diagnosis not present

## 2022-07-30 DIAGNOSIS — D631 Anemia in chronic kidney disease: Secondary | ICD-10-CM | POA: Diagnosis not present

## 2022-07-30 DIAGNOSIS — N2581 Secondary hyperparathyroidism of renal origin: Secondary | ICD-10-CM | POA: Diagnosis not present

## 2022-07-30 DIAGNOSIS — N186 End stage renal disease: Secondary | ICD-10-CM | POA: Diagnosis not present

## 2022-08-01 DIAGNOSIS — E113522 Type 2 diabetes mellitus with proliferative diabetic retinopathy with traction retinal detachment involving the macula, left eye: Secondary | ICD-10-CM | POA: Diagnosis not present

## 2022-08-01 NOTE — Progress Notes (Unsigned)
  Care Coordination  Outreach Note  08/01/2022 Name: [name] [name] MRN: [id number] DOB: 1975/10/20   Care Coordination Outreach Attempts: A second unsuccessful outreach was attempted today to offer the patient with information about available care coordination services as a benefit of their health plan.     Follow Up Plan:  Additional outreach attempts will be made to offer the patient care coordination information and services.   Encounter Outcome:  No Answer  [place]  Direct Dial: [phone]

## 2022-08-02 DIAGNOSIS — L299 Pruritus, unspecified: Secondary | ICD-10-CM | POA: Diagnosis not present

## 2022-08-02 DIAGNOSIS — D689 Coagulation defect, unspecified: Secondary | ICD-10-CM | POA: Diagnosis not present

## 2022-08-02 DIAGNOSIS — N2581 Secondary hyperparathyroidism of renal origin: Secondary | ICD-10-CM | POA: Diagnosis not present

## 2022-08-02 DIAGNOSIS — Z992 Dependence on renal dialysis: Secondary | ICD-10-CM | POA: Diagnosis not present

## 2022-08-02 DIAGNOSIS — R52 Pain, unspecified: Secondary | ICD-10-CM | POA: Diagnosis not present

## 2022-08-02 DIAGNOSIS — D631 Anemia in chronic kidney disease: Secondary | ICD-10-CM | POA: Diagnosis not present

## 2022-08-02 DIAGNOSIS — D509 Iron deficiency anemia, unspecified: Secondary | ICD-10-CM | POA: Diagnosis not present

## 2022-08-02 DIAGNOSIS — N186 End stage renal disease: Secondary | ICD-10-CM | POA: Diagnosis not present

## 2022-08-03 NOTE — Progress Notes (Signed)
  Care Coordination   Note   08/03/2022 Name: Kaaliyah Kita MRN: [id number] DOB: February 24, 1976  Andretta Ergle is a 47 y.o. year old female who sees Riesa Pope, MD for primary care. I reached out to [employer] by phone today to offer care coordination services.  Ms. Debes was given information about Care Coordination services today including:   The Care Coordination services include support from the care team which includes your Nurse Coordinator, Clinical Social Worker, or Pharmacist.  The Care Coordination team is here to help remove barriers to the health concerns and goals most important to you. Care Coordination services are voluntary, and the patient may decline or stop services at any time by request to their care team member.   Care Coordination Consent Status: Patient agreed to services and verbal consent obtained.   Follow up plan:  Telephone appointment with care coordination team member scheduled for:  08/10/22  Encounter Outcome:  Pt. Scheduled  [place]  Direct Dial: [phone]

## 2022-08-04 DIAGNOSIS — Z992 Dependence on renal dialysis: Secondary | ICD-10-CM | POA: Diagnosis not present

## 2022-08-04 DIAGNOSIS — D509 Iron deficiency anemia, unspecified: Secondary | ICD-10-CM | POA: Diagnosis not present

## 2022-08-04 DIAGNOSIS — N186 End stage renal disease: Secondary | ICD-10-CM | POA: Diagnosis not present

## 2022-08-04 DIAGNOSIS — N2581 Secondary hyperparathyroidism of renal origin: Secondary | ICD-10-CM | POA: Diagnosis not present

## 2022-08-04 DIAGNOSIS — R52 Pain, unspecified: Secondary | ICD-10-CM | POA: Diagnosis not present

## 2022-08-04 DIAGNOSIS — D689 Coagulation defect, unspecified: Secondary | ICD-10-CM | POA: Diagnosis not present

## 2022-08-04 DIAGNOSIS — D631 Anemia in chronic kidney disease: Secondary | ICD-10-CM | POA: Diagnosis not present

## 2022-08-04 DIAGNOSIS — L299 Pruritus, unspecified: Secondary | ICD-10-CM | POA: Diagnosis not present

## 2022-08-06 DIAGNOSIS — Z992 Dependence on renal dialysis: Secondary | ICD-10-CM | POA: Diagnosis not present

## 2022-08-06 DIAGNOSIS — R52 Pain, unspecified: Secondary | ICD-10-CM | POA: Diagnosis not present

## 2022-08-06 DIAGNOSIS — D689 Coagulation defect, unspecified: Secondary | ICD-10-CM | POA: Diagnosis not present

## 2022-08-06 DIAGNOSIS — D631 Anemia in chronic kidney disease: Secondary | ICD-10-CM | POA: Diagnosis not present

## 2022-08-06 DIAGNOSIS — L299 Pruritus, unspecified: Secondary | ICD-10-CM | POA: Diagnosis not present

## 2022-08-06 DIAGNOSIS — N186 End stage renal disease: Secondary | ICD-10-CM | POA: Diagnosis not present

## 2022-08-06 DIAGNOSIS — D509 Iron deficiency anemia, unspecified: Secondary | ICD-10-CM | POA: Diagnosis not present

## 2022-08-06 DIAGNOSIS — N2581 Secondary hyperparathyroidism of renal origin: Secondary | ICD-10-CM | POA: Diagnosis not present

## 2022-08-09 DIAGNOSIS — N2581 Secondary hyperparathyroidism of renal origin: Secondary | ICD-10-CM | POA: Diagnosis not present

## 2022-08-09 DIAGNOSIS — N186 End stage renal disease: Secondary | ICD-10-CM | POA: Diagnosis not present

## 2022-08-09 DIAGNOSIS — D631 Anemia in chronic kidney disease: Secondary | ICD-10-CM | POA: Diagnosis not present

## 2022-08-09 DIAGNOSIS — L299 Pruritus, unspecified: Secondary | ICD-10-CM | POA: Diagnosis not present

## 2022-08-09 DIAGNOSIS — R52 Pain, unspecified: Secondary | ICD-10-CM | POA: Diagnosis not present

## 2022-08-09 DIAGNOSIS — D509 Iron deficiency anemia, unspecified: Secondary | ICD-10-CM | POA: Diagnosis not present

## 2022-08-09 DIAGNOSIS — Z992 Dependence on renal dialysis: Secondary | ICD-10-CM | POA: Diagnosis not present

## 2022-08-09 DIAGNOSIS — D689 Coagulation defect, unspecified: Secondary | ICD-10-CM | POA: Diagnosis not present

## 2022-08-10 ENCOUNTER — Telehealth: Payer: Self-pay

## 2022-08-10 NOTE — Patient Outreach (Signed)
  Care Coordination   08/10/2022 Name: [name] [name] MRN: [id number] DOB: 12-25-1975   Care Coordination Outreach Attempts:  An unsuccessful telephone outreach was attempted today to offer the patient information about available care coordination services as a benefit of their health plan.   Follow Up Plan:  Additional outreach attempts will be made to offer the patient care coordination information and services.   Encounter Outcome:  No Answer   Care Coordination Interventions:  No, not indicated    Lazaro Arms RN, BSN, [facility]   Phone: [phone]

## 2022-08-11 DIAGNOSIS — Z992 Dependence on renal dialysis: Secondary | ICD-10-CM | POA: Diagnosis not present

## 2022-08-11 DIAGNOSIS — D509 Iron deficiency anemia, unspecified: Secondary | ICD-10-CM | POA: Diagnosis not present

## 2022-08-11 DIAGNOSIS — L299 Pruritus, unspecified: Secondary | ICD-10-CM | POA: Diagnosis not present

## 2022-08-11 DIAGNOSIS — R52 Pain, unspecified: Secondary | ICD-10-CM | POA: Diagnosis not present

## 2022-08-11 DIAGNOSIS — D631 Anemia in chronic kidney disease: Secondary | ICD-10-CM | POA: Diagnosis not present

## 2022-08-11 DIAGNOSIS — N2581 Secondary hyperparathyroidism of renal origin: Secondary | ICD-10-CM | POA: Diagnosis not present

## 2022-08-11 DIAGNOSIS — D689 Coagulation defect, unspecified: Secondary | ICD-10-CM | POA: Diagnosis not present

## 2022-08-11 DIAGNOSIS — N186 End stage renal disease: Secondary | ICD-10-CM | POA: Diagnosis not present

## 2022-08-12 ENCOUNTER — Telehealth: Payer: Self-pay | Admitting: *Deleted

## 2022-08-12 NOTE — Progress Notes (Unsigned)
  Care Coordination Note  08/12/2022 Name: [name] MRN: [id number] DOB: May 17, 1976  [name] [name] is a 47 y.o. year old female who is a primary care patient of Katsadouros, Candace Gallus, MD and is actively engaged with the care management team. I reached out to [facility] by phone today to assist with re-scheduling a follow up visit with the RN Case Manager  Follow up plan: Unsuccessful telephone outreach attempt made. A HIPAA compliant phone message was left for the patient providing contact information and requesting a return call.   [place]  Direct Dial: [phone]

## 2022-08-13 DIAGNOSIS — N186 End stage renal disease: Secondary | ICD-10-CM | POA: Diagnosis not present

## 2022-08-13 DIAGNOSIS — D689 Coagulation defect, unspecified: Secondary | ICD-10-CM | POA: Diagnosis not present

## 2022-08-13 DIAGNOSIS — L299 Pruritus, unspecified: Secondary | ICD-10-CM | POA: Diagnosis not present

## 2022-08-13 DIAGNOSIS — N2581 Secondary hyperparathyroidism of renal origin: Secondary | ICD-10-CM | POA: Diagnosis not present

## 2022-08-13 DIAGNOSIS — D509 Iron deficiency anemia, unspecified: Secondary | ICD-10-CM | POA: Diagnosis not present

## 2022-08-13 DIAGNOSIS — D631 Anemia in chronic kidney disease: Secondary | ICD-10-CM | POA: Diagnosis not present

## 2022-08-13 DIAGNOSIS — Z992 Dependence on renal dialysis: Secondary | ICD-10-CM | POA: Diagnosis not present

## 2022-08-13 DIAGNOSIS — R52 Pain, unspecified: Secondary | ICD-10-CM | POA: Diagnosis not present

## 2022-08-16 ENCOUNTER — Other Ambulatory Visit: Payer: Self-pay | Admitting: Internal Medicine

## 2022-08-16 DIAGNOSIS — N186 End stage renal disease: Secondary | ICD-10-CM | POA: Diagnosis not present

## 2022-08-16 DIAGNOSIS — L299 Pruritus, unspecified: Secondary | ICD-10-CM | POA: Diagnosis not present

## 2022-08-16 DIAGNOSIS — D509 Iron deficiency anemia, unspecified: Secondary | ICD-10-CM | POA: Diagnosis not present

## 2022-08-16 DIAGNOSIS — D689 Coagulation defect, unspecified: Secondary | ICD-10-CM | POA: Diagnosis not present

## 2022-08-16 DIAGNOSIS — Z992 Dependence on renal dialysis: Secondary | ICD-10-CM | POA: Diagnosis not present

## 2022-08-16 DIAGNOSIS — D631 Anemia in chronic kidney disease: Secondary | ICD-10-CM | POA: Diagnosis not present

## 2022-08-16 DIAGNOSIS — R52 Pain, unspecified: Secondary | ICD-10-CM | POA: Diagnosis not present

## 2022-08-16 DIAGNOSIS — N2581 Secondary hyperparathyroidism of renal origin: Secondary | ICD-10-CM | POA: Diagnosis not present

## 2022-08-16 NOTE — Progress Notes (Signed)
  Care Coordination Note  08/16/2022 Name: Swannie Rase MRN: [id number] DOB: Jan 03, 1976  Elna Brieske is a 47 y.o. year old female who is a primary care patient of Katsadouros, Candace Gallus, MD and is actively engaged with the care management team. I reached out to [facility] by phone today to assist with re-scheduling an initial visit with the RN Case Manager  Follow up plan: Unsuccessful telephone outreach attempt made. Unable to make contact on outreach attempts x 2.  We have been unable to make contact with the patient for follow up. The care management team is available to follow up with the patient after provider conversation with the patient regarding recommendation for care management engagement and subsequent re-referral to the care management team.  [place]  Direct Dial: [phone]

## 2022-08-17 NOTE — Telephone Encounter (Signed)
Called pt - she has dialysis on Thursday, cannot come tomorrow. At this time, no available appts next week; not until 4/8.

## 2022-08-17 NOTE — Telephone Encounter (Signed)
Called pt - stated her dialysis time is from 1030Am until 1630Pm.

## 2022-08-17 NOTE — Telephone Encounter (Signed)
Do we know the reason for the medication refill request?

## 2022-08-17 NOTE — Telephone Encounter (Signed)
Can we please schedule her an appointmetn fo ra vaginl

## 2022-08-17 NOTE — Telephone Encounter (Signed)
I called pt  about refill request for Flagyl - she stated she's having symptoms of BV and the medication helped the last time. C/o an odor but no itching.

## 2022-08-18 DIAGNOSIS — N186 End stage renal disease: Secondary | ICD-10-CM | POA: Diagnosis not present

## 2022-08-18 DIAGNOSIS — R52 Pain, unspecified: Secondary | ICD-10-CM | POA: Diagnosis not present

## 2022-08-18 DIAGNOSIS — N2581 Secondary hyperparathyroidism of renal origin: Secondary | ICD-10-CM | POA: Diagnosis not present

## 2022-08-18 DIAGNOSIS — Z992 Dependence on renal dialysis: Secondary | ICD-10-CM | POA: Diagnosis not present

## 2022-08-18 DIAGNOSIS — D509 Iron deficiency anemia, unspecified: Secondary | ICD-10-CM | POA: Diagnosis not present

## 2022-08-18 DIAGNOSIS — L299 Pruritus, unspecified: Secondary | ICD-10-CM | POA: Diagnosis not present

## 2022-08-18 DIAGNOSIS — D689 Coagulation defect, unspecified: Secondary | ICD-10-CM | POA: Diagnosis not present

## 2022-08-18 DIAGNOSIS — D631 Anemia in chronic kidney disease: Secondary | ICD-10-CM | POA: Diagnosis not present

## 2022-08-20 DIAGNOSIS — Z992 Dependence on renal dialysis: Secondary | ICD-10-CM | POA: Diagnosis not present

## 2022-08-20 DIAGNOSIS — L299 Pruritus, unspecified: Secondary | ICD-10-CM | POA: Diagnosis not present

## 2022-08-20 DIAGNOSIS — N186 End stage renal disease: Secondary | ICD-10-CM | POA: Diagnosis not present

## 2022-08-20 DIAGNOSIS — D509 Iron deficiency anemia, unspecified: Secondary | ICD-10-CM | POA: Diagnosis not present

## 2022-08-20 DIAGNOSIS — D631 Anemia in chronic kidney disease: Secondary | ICD-10-CM | POA: Diagnosis not present

## 2022-08-20 DIAGNOSIS — N2581 Secondary hyperparathyroidism of renal origin: Secondary | ICD-10-CM | POA: Diagnosis not present

## 2022-08-20 DIAGNOSIS — D689 Coagulation defect, unspecified: Secondary | ICD-10-CM | POA: Diagnosis not present

## 2022-08-20 DIAGNOSIS — R52 Pain, unspecified: Secondary | ICD-10-CM | POA: Diagnosis not present

## 2022-08-21 DIAGNOSIS — Z992 Dependence on renal dialysis: Secondary | ICD-10-CM | POA: Diagnosis not present

## 2022-08-21 DIAGNOSIS — E1122 Type 2 diabetes mellitus with diabetic chronic kidney disease: Secondary | ICD-10-CM | POA: Diagnosis not present

## 2022-08-21 DIAGNOSIS — N186 End stage renal disease: Secondary | ICD-10-CM | POA: Diagnosis not present

## 2022-08-23 ENCOUNTER — Ambulatory Visit (INDEPENDENT_AMBULATORY_CARE_PROVIDER_SITE_OTHER): Payer: 59 | Admitting: Licensed Clinical Social Worker

## 2022-08-23 DIAGNOSIS — R52 Pain, unspecified: Secondary | ICD-10-CM | POA: Diagnosis not present

## 2022-08-23 DIAGNOSIS — E1122 Type 2 diabetes mellitus with diabetic chronic kidney disease: Secondary | ICD-10-CM | POA: Diagnosis not present

## 2022-08-23 DIAGNOSIS — Z992 Dependence on renal dialysis: Secondary | ICD-10-CM | POA: Diagnosis not present

## 2022-08-23 DIAGNOSIS — N2581 Secondary hyperparathyroidism of renal origin: Secondary | ICD-10-CM | POA: Diagnosis not present

## 2022-08-23 DIAGNOSIS — N186 End stage renal disease: Secondary | ICD-10-CM | POA: Diagnosis not present

## 2022-08-23 DIAGNOSIS — F419 Anxiety disorder, unspecified: Secondary | ICD-10-CM

## 2022-08-23 DIAGNOSIS — D689 Coagulation defect, unspecified: Secondary | ICD-10-CM | POA: Diagnosis not present

## 2022-08-23 DIAGNOSIS — L299 Pruritus, unspecified: Secondary | ICD-10-CM | POA: Diagnosis not present

## 2022-08-23 DIAGNOSIS — D631 Anemia in chronic kidney disease: Secondary | ICD-10-CM | POA: Diagnosis not present

## 2022-08-23 DIAGNOSIS — D509 Iron deficiency anemia, unspecified: Secondary | ICD-10-CM | POA: Diagnosis not present

## 2022-08-23 DIAGNOSIS — F32A Depression, unspecified: Secondary | ICD-10-CM

## 2022-08-23 NOTE — BH Specialist Note (Signed)
Patient no-showed today's appointment; appointment was for Telephone visit at 10:30am. [place] [facility] from here on out) [facility] attempted patient twice via Telephone number [phone]    [facility] contacted patient from telephone number [phone]. [place] unable to  get through , phone line unavailable and [facility] unable to leave a VM.    Patient will need to reschedule appointment by calling Internal medicine center [phone].  Milus Height, MSW, [place]  Internal Medicine Center Direct Dial:[phone]  Fax [phone] Main Office Phone: [phone] [address] [place], [place] [address] Website: [place], [place]

## 2022-08-25 DIAGNOSIS — Z992 Dependence on renal dialysis: Secondary | ICD-10-CM | POA: Diagnosis not present

## 2022-08-25 DIAGNOSIS — L299 Pruritus, unspecified: Secondary | ICD-10-CM | POA: Diagnosis not present

## 2022-08-25 DIAGNOSIS — E1122 Type 2 diabetes mellitus with diabetic chronic kidney disease: Secondary | ICD-10-CM | POA: Diagnosis not present

## 2022-08-25 DIAGNOSIS — N186 End stage renal disease: Secondary | ICD-10-CM | POA: Diagnosis not present

## 2022-08-25 DIAGNOSIS — D509 Iron deficiency anemia, unspecified: Secondary | ICD-10-CM | POA: Diagnosis not present

## 2022-08-25 DIAGNOSIS — D631 Anemia in chronic kidney disease: Secondary | ICD-10-CM | POA: Diagnosis not present

## 2022-08-25 DIAGNOSIS — R52 Pain, unspecified: Secondary | ICD-10-CM | POA: Diagnosis not present

## 2022-08-25 DIAGNOSIS — D689 Coagulation defect, unspecified: Secondary | ICD-10-CM | POA: Diagnosis not present

## 2022-08-25 DIAGNOSIS — N2581 Secondary hyperparathyroidism of renal origin: Secondary | ICD-10-CM | POA: Diagnosis not present

## 2022-08-27 DIAGNOSIS — R52 Pain, unspecified: Secondary | ICD-10-CM | POA: Diagnosis not present

## 2022-08-27 DIAGNOSIS — D689 Coagulation defect, unspecified: Secondary | ICD-10-CM | POA: Diagnosis not present

## 2022-08-27 DIAGNOSIS — L299 Pruritus, unspecified: Secondary | ICD-10-CM | POA: Diagnosis not present

## 2022-08-27 DIAGNOSIS — D509 Iron deficiency anemia, unspecified: Secondary | ICD-10-CM | POA: Diagnosis not present

## 2022-08-27 DIAGNOSIS — N2581 Secondary hyperparathyroidism of renal origin: Secondary | ICD-10-CM | POA: Diagnosis not present

## 2022-08-27 DIAGNOSIS — Z992 Dependence on renal dialysis: Secondary | ICD-10-CM | POA: Diagnosis not present

## 2022-08-27 DIAGNOSIS — D631 Anemia in chronic kidney disease: Secondary | ICD-10-CM | POA: Diagnosis not present

## 2022-08-27 DIAGNOSIS — E1122 Type 2 diabetes mellitus with diabetic chronic kidney disease: Secondary | ICD-10-CM | POA: Diagnosis not present

## 2022-08-27 DIAGNOSIS — N186 End stage renal disease: Secondary | ICD-10-CM | POA: Diagnosis not present

## 2022-08-30 DIAGNOSIS — N2581 Secondary hyperparathyroidism of renal origin: Secondary | ICD-10-CM | POA: Diagnosis not present

## 2022-08-30 DIAGNOSIS — D509 Iron deficiency anemia, unspecified: Secondary | ICD-10-CM | POA: Diagnosis not present

## 2022-08-30 DIAGNOSIS — Z992 Dependence on renal dialysis: Secondary | ICD-10-CM | POA: Diagnosis not present

## 2022-08-30 DIAGNOSIS — D631 Anemia in chronic kidney disease: Secondary | ICD-10-CM | POA: Diagnosis not present

## 2022-08-30 DIAGNOSIS — D689 Coagulation defect, unspecified: Secondary | ICD-10-CM | POA: Diagnosis not present

## 2022-08-30 DIAGNOSIS — R52 Pain, unspecified: Secondary | ICD-10-CM | POA: Diagnosis not present

## 2022-08-30 DIAGNOSIS — L299 Pruritus, unspecified: Secondary | ICD-10-CM | POA: Diagnosis not present

## 2022-08-30 DIAGNOSIS — N186 End stage renal disease: Secondary | ICD-10-CM | POA: Diagnosis not present

## 2022-08-30 DIAGNOSIS — E1122 Type 2 diabetes mellitus with diabetic chronic kidney disease: Secondary | ICD-10-CM | POA: Diagnosis not present

## 2022-09-01 DIAGNOSIS — N186 End stage renal disease: Secondary | ICD-10-CM | POA: Diagnosis not present

## 2022-09-01 DIAGNOSIS — E1122 Type 2 diabetes mellitus with diabetic chronic kidney disease: Secondary | ICD-10-CM | POA: Diagnosis not present

## 2022-09-01 DIAGNOSIS — D631 Anemia in chronic kidney disease: Secondary | ICD-10-CM | POA: Diagnosis not present

## 2022-09-01 DIAGNOSIS — D689 Coagulation defect, unspecified: Secondary | ICD-10-CM | POA: Diagnosis not present

## 2022-09-01 DIAGNOSIS — Z992 Dependence on renal dialysis: Secondary | ICD-10-CM | POA: Diagnosis not present

## 2022-09-01 DIAGNOSIS — N2581 Secondary hyperparathyroidism of renal origin: Secondary | ICD-10-CM | POA: Diagnosis not present

## 2022-09-01 DIAGNOSIS — D509 Iron deficiency anemia, unspecified: Secondary | ICD-10-CM | POA: Diagnosis not present

## 2022-09-01 DIAGNOSIS — R52 Pain, unspecified: Secondary | ICD-10-CM | POA: Diagnosis not present

## 2022-09-01 DIAGNOSIS — L299 Pruritus, unspecified: Secondary | ICD-10-CM | POA: Diagnosis not present

## 2022-09-02 ENCOUNTER — Other Ambulatory Visit (HOSPITAL_COMMUNITY)
Admission: RE | Admit: 2022-09-02 | Discharge: 2022-09-02 | Disposition: A | Discharge status: Home / Self Care | Payer: 59 | Source: Ambulatory Visit | Attending: Internal Medicine | Admitting: Internal Medicine

## 2022-09-02 ENCOUNTER — Ambulatory Visit (INDEPENDENT_AMBULATORY_CARE_PROVIDER_SITE_OTHER): Payer: 59 | Admitting: Student

## 2022-09-02 ENCOUNTER — Encounter: Payer: Self-pay | Admitting: Student

## 2022-09-02 VITALS — BP 136/71 | HR 91 | Temp 98.5°F | Wt 289.8 lb

## 2022-09-02 DIAGNOSIS — M79643 Pain in unspecified hand: Secondary | ICD-10-CM | POA: Diagnosis not present

## 2022-09-02 DIAGNOSIS — N186 End stage renal disease: Secondary | ICD-10-CM

## 2022-09-02 DIAGNOSIS — Z8619 Personal history of other infectious and parasitic diseases: Secondary | ICD-10-CM | POA: Diagnosis not present

## 2022-09-02 DIAGNOSIS — E1159 Type 2 diabetes mellitus with other circulatory complications: Secondary | ICD-10-CM

## 2022-09-02 DIAGNOSIS — Z89612 Acquired absence of left leg above knee: Secondary | ICD-10-CM

## 2022-09-02 DIAGNOSIS — N898 Other specified noninflammatory disorders of vagina: Secondary | ICD-10-CM | POA: Insufficient documentation

## 2022-09-02 DIAGNOSIS — Z853 Personal history of malignant neoplasm of breast: Secondary | ICD-10-CM | POA: Diagnosis not present

## 2022-09-02 DIAGNOSIS — I152 Hypertension secondary to endocrine disorders: Secondary | ICD-10-CM | POA: Diagnosis not present

## 2022-09-02 DIAGNOSIS — Z9012 Acquired absence of left breast and nipple: Secondary | ICD-10-CM | POA: Diagnosis not present

## 2022-09-02 DIAGNOSIS — Z7985 Long-term (current) use of injectable non-insulin antidiabetic drugs: Secondary | ICD-10-CM

## 2022-09-02 DIAGNOSIS — M25519 Pain in unspecified shoulder: Secondary | ICD-10-CM | POA: Diagnosis not present

## 2022-09-02 DIAGNOSIS — E1122 Type 2 diabetes mellitus with diabetic chronic kidney disease: Secondary | ICD-10-CM

## 2022-09-02 DIAGNOSIS — Z992 Dependence on renal dialysis: Secondary | ICD-10-CM | POA: Diagnosis not present

## 2022-09-02 DIAGNOSIS — F419 Anxiety disorder, unspecified: Secondary | ICD-10-CM | POA: Diagnosis not present

## 2022-09-02 DIAGNOSIS — Z20822 Contact with and (suspected) exposure to covid-19: Secondary | ICD-10-CM | POA: Diagnosis not present

## 2022-09-02 DIAGNOSIS — E875 Hyperkalemia: Secondary | ICD-10-CM | POA: Diagnosis not present

## 2022-09-02 DIAGNOSIS — G4733 Obstructive sleep apnea (adult) (pediatric): Secondary | ICD-10-CM

## 2022-09-02 DIAGNOSIS — N760 Acute vaginitis: Secondary | ICD-10-CM

## 2022-09-02 DIAGNOSIS — G890 Central pain syndrome: Secondary | ICD-10-CM | POA: Diagnosis not present

## 2022-09-02 DIAGNOSIS — B9689 Other specified bacterial agents as the cause of diseases classified elsewhere: Secondary | ICD-10-CM

## 2022-09-02 DIAGNOSIS — Z79891 Long term (current) use of opiate analgesic: Secondary | ICD-10-CM | POA: Diagnosis not present

## 2022-09-02 DIAGNOSIS — I129 Hypertensive chronic kidney disease with stage 1 through stage 4 chronic kidney disease, or unspecified chronic kidney disease: Secondary | ICD-10-CM | POA: Diagnosis not present

## 2022-09-02 DIAGNOSIS — F32A Depression, unspecified: Secondary | ICD-10-CM

## 2022-09-02 DIAGNOSIS — G894 Chronic pain syndrome: Secondary | ICD-10-CM | POA: Diagnosis not present

## 2022-09-02 DIAGNOSIS — M25562 Pain in left knee: Secondary | ICD-10-CM | POA: Diagnosis not present

## 2022-09-02 DIAGNOSIS — Z113 Encounter for screening for infections with a predominantly sexual mode of transmission: Secondary | ICD-10-CM

## 2022-09-02 DIAGNOSIS — N189 Chronic kidney disease, unspecified: Secondary | ICD-10-CM | POA: Diagnosis not present

## 2022-09-02 DIAGNOSIS — M545 Low back pain, unspecified: Secondary | ICD-10-CM | POA: Diagnosis not present

## 2022-09-02 DIAGNOSIS — M109 Gout, unspecified: Secondary | ICD-10-CM | POA: Diagnosis not present

## 2022-09-02 DIAGNOSIS — I159 Secondary hypertension, unspecified: Secondary | ICD-10-CM | POA: Diagnosis not present

## 2022-09-02 DIAGNOSIS — A599 Trichomoniasis, unspecified: Secondary | ICD-10-CM

## 2022-09-02 DIAGNOSIS — G546 Phantom limb syndrome with pain: Secondary | ICD-10-CM | POA: Diagnosis not present

## 2022-09-02 NOTE — Assessment & Plan Note (Addendum)
I informed patient that she missed a phone call from [place] last week.  Patient states between her dialysis sessions and doctors appointments, she had a lot going on and so she forgot about the appointment.  I advised her to call Marena Chancy to set up an appointment for CBT and disappointments can be done virtually. -Advised to return Bianca's phone call -Continue Wellbutrin 100 mg every 48 hours.

## 2022-09-02 NOTE — Assessment & Plan Note (Signed)
Patient reports that her prosthetic left leg has become too heavy and large for her. She has lost some weight on mounjaro and has had difficulty ambulating due to the heavy prosthetic. The [facility] requires a letter from her doctor in order to adjust her prosthetics. -Written letter to be faxed to [facility] for prosthetic adjustment

## 2022-09-02 NOTE — Patient Instructions (Signed)
Thank you, Ms.Macyn Rutz for allowing [place] to provide your care today. Today we discussed your prosthetics, vaginal symptoms, sleep apnea and recent eye surgery.  For your vaginal symptoms, I will call you with the results of the test and any required medications.  I have ordered the following labs for you:   Lab Orders         HIV antibody (with reflex)         RPR      I will call if any are abnormal. All of your labs can be accessed through "My Chart".  I have ordered the following tests: Sleep study and cervicovaginal ancillary tests  My Chart Access: [email or web address]  Please follow-up in 3 months  Please make sure to arrive 15 minutes prior to your next appointment. If you arrive late, you may be asked to reschedule.    We look forward to seeing you next time. Please call our clinic at [phone] if you have any questions or concerns. The best time to call is Monday-Friday from 9am-4pm, but there is someone available 24/7. If after hours or the weekend, call the main hospital number and ask for the Internal Medicine Resident On-Call. If you need medication refills, please notify your pharmacy one week in advance and they will send [place] a request.   Thank you for letting us take part in your care. Wishing you the best!  Steffanie Rainwater, MD 09/02/2022, 9:21 AM IM Resident, PGY-3 [place] Heck 41:10

## 2022-09-02 NOTE — Progress Notes (Signed)
CC: Vaginal odor, letter  HPI:  [name] is a 47 y.o. female with PMH as below who presents to clinic for evaluation of vaginal odor and request for letter to adjust her prosthetics. Please see problem based charting for evaluation, assessment and plan.  Past Medical History:  Diagnosis Date   Abdominal muscle pain 09/08/2016   Abnormal Pap smear of cervix 2009   Abscess    history of multiple abscesses   Acute bilateral low back pain 02/13/2017   Acute blood loss anemia    Hx  Iron infusions   Acute on chronic renal failure (HCC) 07/12/2012   Acute renal failure (HCC) 07/12/2012   Acute respiratory failure with hypoxia (HCC) 10/21/2019   Adjustment disorder with depressed mood 03/26/2017   AKI (acute kidney injury) (HCC)    Dialysis t-th-s   Anemia 10/21/2019   Anemia of chronic disease 2002   Anginal pain (HCC)    Anxiety    Panic attacks   Bilateral lower extremity edema 05/13/2016   Bipolar disorder (HCC)    Cataract    B/L cataract   Cellulitis 05/21/2014   right eye   CHF (congestive heart failure) (HCC)    Chronic bronchitis (HCC)    "get it q yr" (05/13/2013)   Chronic pain    Chronic pain of right knee 09/08/2016   Crutches as ambulation aid    uses forearm crutchs, Left  AKA with prosthetic.   Dehiscence of amputation stump (HCC)    Depression    Edema of lower extremity    Endocarditis 2002   subacute bacterial endocarditis.    Fall    Family history of anesthesia complication    "my mom has a hard time coming out from under"   Fibromyalgia    GERD (gastroesophageal reflux disease)    occ   Heart murmur    never has had any problems   Herpes simplex type 1 infection 01/16/2018   History of blood transfusion    "just low blood count" (05/13/2013)   Hyperlipidemia    Hypertension    Hypothyroidism    Hypothyroidism, adult 03/21/2014   Hypoxia    Leukocytosis    Muscle spasm 12/16/2020   Necrosis (HCC)    and ulceration    Necrotizing fasciitis s/p OR debridements 07/06/2012   Obesity    OSA on CPAP    does not use CPAP   Peripheral neuropathy    Pneumonia    Pyelonephritis 12/31/2020   Routine adult health maintenance 12/08/2016   Severe protein-calorie malnutrition (HCC)    Status post below knee amputation, left (HCC) 04/11/2017   Type II diabetes mellitus (HCC)    Type  II   Vaginal irritation 03/14/2016   Weak pulse 07/21/2021   Wound dehiscence 09/04/2019    Review of Systems:  Constitutional: Negative for fever or fatigue Eyes: Positive for blurry vision. Abdomen: Negative for abdominal pain, constipation or diarrhea GU: Positive for vaginal odor and occasional itching. Negative for vaginal discharge, lesions or bleeding. Neuro: Negative for headache or weakness  Physical Exam: General: Pleasant well-appearing middle-aged woman. Appears older than stated age. No acute distress. Cardiac: Mild tachycardic. Regular rhythm.  No murmurs, rubs or gallops. Trace RLE edema Respiratory: Lungs CTAB. No wheezing or crackles. Skin: Warm, dry and intact. Multiple hyperpigmented lesions on extremities. Extremities: Left AKA with a prosthetic.  Left arm AVF with palpable thrill. Neuro: A&O x 3. Moves all extremities.  Normal sensation to gross touch. Psych: Appropriate  mood and affect.  Vitals:   09/02/22 0851 09/02/22 0927  BP: (!) 147/75 136/71  Pulse: 100 91  Temp: 98.5 F (36.9 C)   TempSrc: Oral   SpO2: 100%   Weight: 289 lb 12.8 oz (131.5 kg)     Assessment & Plan:   Vaginal odor Patient with a history of trichomonas and BV presenting for evaluation of vaginal odor.  Patient states she took the Diflucan that was prescribed for vaginal itching a few weeks ago, however she has noticed a vaginal odor that has not improved. She denies any vaginal discharge, bleeding, bumps or lesions and states the vaginal itching has improved.  She has no urinary symptoms.  She is currently sexually active and  does not always use protection so she is concerned about possible STI. She prefers to do a self swab today and does not think she needs a vaginal exam.  Plan: -Cervicovaginal ancillary testing for GC/chlamydia, trichomonas, bacterial vaginosis and vaginal candidiasis -HIV, RPR -Follow-up as needed, she will require vaginal exam if persistent symptoms.  OSA (obstructive sleep apnea) Patient with a history of OSA not on CPAP requesting an order for repeat sleep study. Patient states she was unable to tolerate CPAP in the past as it gave her nightmares. She is being worked up for bruxism by her dentist however patient needs a repeat sleep study in order for her dentist to custom a mouthguard for her.  -Sleep study referral -Follow-up with dentist after sleep study  Anxiety and depression I informed patient that she missed a phone call from [name] [name] last week.  Patient states between her dialysis sessions and doctors appointments, she had a lot going on and so she forgot about the appointment.  I advised her to call [name] [name] to set up an appointment for CBT and disappointments can be done virtually. -Advised to return [name] [name]'s phone call -Continue Wellbutrin 100 mg every 48 hours.  End-stage renal disease on hemodialysis [facility]) Patient has been tolerating dialysis well and denies missing any sessions.  She is currently trying to get on the transplant list. -Continue HD on TTS   Hypertension associated with diabetes (HCC) Patient's BP medications being adjusted by her nephrologist as her BP seems to drop significantly with HD. SBP initially in the 140s but repeat improved to the 130s.  Plan: -Continue amlodipine 10 mg daily -Continue metoprolol 25 mg daily -Follow-up with nephrology  Hx of AKA (above knee amputation), left [facility]) Patient reports that her prosthetic left leg has become too heavy and large for her. She has lost some weight on mounjaro and has had difficulty ambulating due to the  heavy prosthetic. The [facility] requires a letter from her doctor in order to adjust her prosthetics. -Written letter to be faxed to [facility] clinic for prosthetic adjustment    See Encounters Tab for problem based charting.  Patient discussed with Dr.  Avis Epley, MD, MPH

## 2022-09-02 NOTE — Assessment & Plan Note (Signed)
Patient has been tolerating dialysis well and denies missing any sessions.  She is currently trying to get on the transplant list. -Continue HD on TTS

## 2022-09-02 NOTE — Progress Notes (Signed)
Internal Medicine Clinic Attending  Case discussed with Dr. [place]  At the time of the visit.  We reviewed the resident's history and exam and pertinent patient test results.  I agree with the assessment, diagnosis, and plan of care documented in the resident's note.  

## 2022-09-02 NOTE — Assessment & Plan Note (Signed)
Patient with a history of OSA not on CPAP requesting an order for repeat sleep study. Patient states she was unable to tolerate CPAP in the past as it gave her nightmares. She is being worked up for bruxism by her dentist however patient needs a repeat sleep study in order for her dentist to custom a mouthguard for her.  -Sleep study referral -Follow-up with dentist after sleep study

## 2022-09-02 NOTE — Assessment & Plan Note (Signed)
Patient's BP medications being adjusted by her nephrologist as her BP seems to drop significantly with HD. SBP initially in the 140s but repeat improved to the 130s.  Plan: -Continue amlodipine 10 mg daily -Continue metoprolol 25 mg daily -Follow-up with nephrology

## 2022-09-02 NOTE — Assessment & Plan Note (Addendum)
Patient with a history of trichomonas and BV presenting for evaluation of vaginal odor.  Patient states she took the Diflucan that was prescribed for vaginal itching a few weeks ago, however she has noticed a vaginal odor that has not improved. She denies any vaginal discharge, bleeding, bumps or lesions and states the vaginal itching has improved.  She has no urinary symptoms.  She is currently sexually active and does not always use protection so she is concerned about possible STI. She prefers to do a self swab today and does not think she needs a vaginal exam.  Plan: -Cervicovaginal ancillary testing for GC/chlamydia, trichomonas, bacterial vaginosis and vaginal candidiasis -HIV, RPR -Follow-up as needed, she will require vaginal exam if persistent symptoms.  Addendum, 09/06/22: STI testing shows patient is negative for HIV, RPR, GC/chlamydia and vaginal candidiasis. She is positive for trichomonas and bacterial vaginosis. Patient was treated for both infections last year.  I discussed results with patient and informed her about the prescription for metronidazole which will treat both infections. I advised patient to get her partner/partners tested or treated. She will also need to abstain from sexual intercourse or alcohol during treatment. She can be retested in the next few weeks to months to ensure clearance or if she has persistent symptoms. -Start metronidazole 500 mg twice daily for 7 days

## 2022-09-03 DIAGNOSIS — R52 Pain, unspecified: Secondary | ICD-10-CM | POA: Diagnosis not present

## 2022-09-03 DIAGNOSIS — D689 Coagulation defect, unspecified: Secondary | ICD-10-CM | POA: Diagnosis not present

## 2022-09-03 DIAGNOSIS — Z992 Dependence on renal dialysis: Secondary | ICD-10-CM | POA: Diagnosis not present

## 2022-09-03 DIAGNOSIS — N186 End stage renal disease: Secondary | ICD-10-CM | POA: Diagnosis not present

## 2022-09-03 DIAGNOSIS — D509 Iron deficiency anemia, unspecified: Secondary | ICD-10-CM | POA: Diagnosis not present

## 2022-09-03 DIAGNOSIS — E1122 Type 2 diabetes mellitus with diabetic chronic kidney disease: Secondary | ICD-10-CM | POA: Diagnosis not present

## 2022-09-03 DIAGNOSIS — D631 Anemia in chronic kidney disease: Secondary | ICD-10-CM | POA: Diagnosis not present

## 2022-09-03 DIAGNOSIS — L299 Pruritus, unspecified: Secondary | ICD-10-CM | POA: Diagnosis not present

## 2022-09-03 DIAGNOSIS — N2581 Secondary hyperparathyroidism of renal origin: Secondary | ICD-10-CM | POA: Diagnosis not present

## 2022-09-04 LAB — RPR: RPR Ser Ql: NONREACTIVE

## 2022-09-04 LAB — HIV ANTIBODY (ROUTINE TESTING W REFLEX): HIV Screen 4th Generation wRfx: NONREACTIVE

## 2022-09-05 ENCOUNTER — Other Ambulatory Visit: Payer: Self-pay | Admitting: Student

## 2022-09-05 DIAGNOSIS — M109 Gout, unspecified: Secondary | ICD-10-CM

## 2022-09-05 DIAGNOSIS — E1159 Type 2 diabetes mellitus with other circulatory complications: Secondary | ICD-10-CM

## 2022-09-06 DIAGNOSIS — Z992 Dependence on renal dialysis: Secondary | ICD-10-CM | POA: Diagnosis not present

## 2022-09-06 DIAGNOSIS — N186 End stage renal disease: Secondary | ICD-10-CM | POA: Diagnosis not present

## 2022-09-06 DIAGNOSIS — L299 Pruritus, unspecified: Secondary | ICD-10-CM | POA: Diagnosis not present

## 2022-09-06 DIAGNOSIS — N760 Acute vaginitis: Secondary | ICD-10-CM | POA: Insufficient documentation

## 2022-09-06 DIAGNOSIS — A599 Trichomoniasis, unspecified: Secondary | ICD-10-CM | POA: Insufficient documentation

## 2022-09-06 DIAGNOSIS — D509 Iron deficiency anemia, unspecified: Secondary | ICD-10-CM | POA: Diagnosis not present

## 2022-09-06 DIAGNOSIS — R52 Pain, unspecified: Secondary | ICD-10-CM | POA: Diagnosis not present

## 2022-09-06 DIAGNOSIS — D631 Anemia in chronic kidney disease: Secondary | ICD-10-CM | POA: Diagnosis not present

## 2022-09-06 DIAGNOSIS — D689 Coagulation defect, unspecified: Secondary | ICD-10-CM | POA: Diagnosis not present

## 2022-09-06 DIAGNOSIS — N2581 Secondary hyperparathyroidism of renal origin: Secondary | ICD-10-CM | POA: Diagnosis not present

## 2022-09-06 DIAGNOSIS — B9689 Other specified bacterial agents as the cause of diseases classified elsewhere: Secondary | ICD-10-CM | POA: Insufficient documentation

## 2022-09-06 DIAGNOSIS — E1122 Type 2 diabetes mellitus with diabetic chronic kidney disease: Secondary | ICD-10-CM | POA: Diagnosis not present

## 2022-09-06 LAB — CERVICOVAGINAL ANCILLARY ONLY
Bacterial Vaginitis (gardnerella): POSITIVE — AB
Candida Glabrata: NEGATIVE
Candida Vaginitis: NEGATIVE
Chlamydia: NEGATIVE
Comment: NEGATIVE
Comment: NEGATIVE
Comment: NEGATIVE
Comment: NEGATIVE
Comment: NEGATIVE
Comment: NORMAL
Neisseria Gonorrhea: NEGATIVE
Trichomonas: POSITIVE — AB

## 2022-09-06 MED ORDER — METRONIDAZOLE 500 MG PO TABS: 500.0000 mg | ORAL_TABLET | Freq: Two times a day (BID) | ORAL | 0 refills | Status: AC

## 2022-09-06 NOTE — Addendum Note (Signed)
Addended bySharrell Ku on: 09/06/2022 04:38 PM   Modules accepted: Orders

## 2022-09-06 NOTE — Assessment & Plan Note (Signed)
Patient with a history of BV presented to clinic for evaluation of vaginal odor and cervicovaginal ancillary testing positive for bacterial vaginosis.  Will treat her BV and trichomonas with a 7-day course of metronidazole. -Metronidazole 500 mg twice daily for 7 days

## 2022-09-06 NOTE — Progress Notes (Signed)
Patient is positive again for trichomonas and BV. I have discussed results with her with instructions to pick up Flagyl and to inform her partner(s) so they can be tested or treated.  The rest of her STI screenings were negative.

## 2022-09-06 NOTE — Assessment & Plan Note (Signed)
Patient with a history of trichomonas infection presented with a complaint of vaginal odor with occasional itching but no vaginal discharge or bleeding. Cervicovaginal ancillary testing positive for trichomonas.  Discussed results with patient concerning treatment and relayed to patient that she has to inform her sexual partner/partners so they can get tested or treated.  Plan: -Metronidazole 500 mg twice daily for 7 days -Consider repeat testing in a few weeks to ensure clearance

## 2022-09-08 DIAGNOSIS — E1122 Type 2 diabetes mellitus with diabetic chronic kidney disease: Secondary | ICD-10-CM | POA: Diagnosis not present

## 2022-09-08 DIAGNOSIS — Z992 Dependence on renal dialysis: Secondary | ICD-10-CM | POA: Diagnosis not present

## 2022-09-08 DIAGNOSIS — D689 Coagulation defect, unspecified: Secondary | ICD-10-CM | POA: Diagnosis not present

## 2022-09-08 DIAGNOSIS — N2581 Secondary hyperparathyroidism of renal origin: Secondary | ICD-10-CM | POA: Diagnosis not present

## 2022-09-08 DIAGNOSIS — L299 Pruritus, unspecified: Secondary | ICD-10-CM | POA: Diagnosis not present

## 2022-09-08 DIAGNOSIS — D631 Anemia in chronic kidney disease: Secondary | ICD-10-CM | POA: Diagnosis not present

## 2022-09-08 DIAGNOSIS — R52 Pain, unspecified: Secondary | ICD-10-CM | POA: Diagnosis not present

## 2022-09-08 DIAGNOSIS — N186 End stage renal disease: Secondary | ICD-10-CM | POA: Diagnosis not present

## 2022-09-08 DIAGNOSIS — D509 Iron deficiency anemia, unspecified: Secondary | ICD-10-CM | POA: Diagnosis not present

## 2022-09-10 DIAGNOSIS — R52 Pain, unspecified: Secondary | ICD-10-CM | POA: Diagnosis not present

## 2022-09-10 DIAGNOSIS — Z992 Dependence on renal dialysis: Secondary | ICD-10-CM | POA: Diagnosis not present

## 2022-09-10 DIAGNOSIS — N2581 Secondary hyperparathyroidism of renal origin: Secondary | ICD-10-CM | POA: Diagnosis not present

## 2022-09-10 DIAGNOSIS — D631 Anemia in chronic kidney disease: Secondary | ICD-10-CM | POA: Diagnosis not present

## 2022-09-10 DIAGNOSIS — D509 Iron deficiency anemia, unspecified: Secondary | ICD-10-CM | POA: Diagnosis not present

## 2022-09-10 DIAGNOSIS — D689 Coagulation defect, unspecified: Secondary | ICD-10-CM | POA: Diagnosis not present

## 2022-09-10 DIAGNOSIS — E1122 Type 2 diabetes mellitus with diabetic chronic kidney disease: Secondary | ICD-10-CM | POA: Diagnosis not present

## 2022-09-10 DIAGNOSIS — L299 Pruritus, unspecified: Secondary | ICD-10-CM | POA: Diagnosis not present

## 2022-09-10 DIAGNOSIS — N186 End stage renal disease: Secondary | ICD-10-CM | POA: Diagnosis not present

## 2022-09-13 DIAGNOSIS — N186 End stage renal disease: Secondary | ICD-10-CM | POA: Diagnosis not present

## 2022-09-13 DIAGNOSIS — E1122 Type 2 diabetes mellitus with diabetic chronic kidney disease: Secondary | ICD-10-CM | POA: Diagnosis not present

## 2022-09-13 DIAGNOSIS — R52 Pain, unspecified: Secondary | ICD-10-CM | POA: Diagnosis not present

## 2022-09-13 DIAGNOSIS — L299 Pruritus, unspecified: Secondary | ICD-10-CM | POA: Diagnosis not present

## 2022-09-13 DIAGNOSIS — N2581 Secondary hyperparathyroidism of renal origin: Secondary | ICD-10-CM | POA: Diagnosis not present

## 2022-09-13 DIAGNOSIS — Z992 Dependence on renal dialysis: Secondary | ICD-10-CM | POA: Diagnosis not present

## 2022-09-13 DIAGNOSIS — D509 Iron deficiency anemia, unspecified: Secondary | ICD-10-CM | POA: Diagnosis not present

## 2022-09-13 DIAGNOSIS — D689 Coagulation defect, unspecified: Secondary | ICD-10-CM | POA: Diagnosis not present

## 2022-09-13 DIAGNOSIS — D631 Anemia in chronic kidney disease: Secondary | ICD-10-CM | POA: Diagnosis not present

## 2022-09-15 DIAGNOSIS — R52 Pain, unspecified: Secondary | ICD-10-CM | POA: Diagnosis not present

## 2022-09-15 DIAGNOSIS — N2581 Secondary hyperparathyroidism of renal origin: Secondary | ICD-10-CM | POA: Diagnosis not present

## 2022-09-15 DIAGNOSIS — N186 End stage renal disease: Secondary | ICD-10-CM | POA: Diagnosis not present

## 2022-09-15 DIAGNOSIS — Z992 Dependence on renal dialysis: Secondary | ICD-10-CM | POA: Diagnosis not present

## 2022-09-15 DIAGNOSIS — L299 Pruritus, unspecified: Secondary | ICD-10-CM | POA: Diagnosis not present

## 2022-09-15 DIAGNOSIS — E1122 Type 2 diabetes mellitus with diabetic chronic kidney disease: Secondary | ICD-10-CM | POA: Diagnosis not present

## 2022-09-15 DIAGNOSIS — D509 Iron deficiency anemia, unspecified: Secondary | ICD-10-CM | POA: Diagnosis not present

## 2022-09-15 DIAGNOSIS — D631 Anemia in chronic kidney disease: Secondary | ICD-10-CM | POA: Diagnosis not present

## 2022-09-15 DIAGNOSIS — D689 Coagulation defect, unspecified: Secondary | ICD-10-CM | POA: Diagnosis not present

## 2022-09-16 DIAGNOSIS — H3509 Other intraretinal microvascular abnormalities: Secondary | ICD-10-CM | POA: Diagnosis not present

## 2022-09-16 DIAGNOSIS — E113513 Type 2 diabetes mellitus with proliferative diabetic retinopathy with macular edema, bilateral: Secondary | ICD-10-CM | POA: Diagnosis not present

## 2022-09-16 DIAGNOSIS — H3582 Retinal ischemia: Secondary | ICD-10-CM | POA: Diagnosis not present

## 2022-09-16 DIAGNOSIS — Z961 Presence of intraocular lens: Secondary | ICD-10-CM | POA: Diagnosis not present

## 2022-09-16 DIAGNOSIS — H3562 Retinal hemorrhage, left eye: Secondary | ICD-10-CM | POA: Diagnosis not present

## 2022-09-17 DIAGNOSIS — L299 Pruritus, unspecified: Secondary | ICD-10-CM | POA: Diagnosis not present

## 2022-09-17 DIAGNOSIS — D509 Iron deficiency anemia, unspecified: Secondary | ICD-10-CM | POA: Diagnosis not present

## 2022-09-17 DIAGNOSIS — N2581 Secondary hyperparathyroidism of renal origin: Secondary | ICD-10-CM | POA: Diagnosis not present

## 2022-09-17 DIAGNOSIS — R52 Pain, unspecified: Secondary | ICD-10-CM | POA: Diagnosis not present

## 2022-09-17 DIAGNOSIS — E1122 Type 2 diabetes mellitus with diabetic chronic kidney disease: Secondary | ICD-10-CM | POA: Diagnosis not present

## 2022-09-17 DIAGNOSIS — D631 Anemia in chronic kidney disease: Secondary | ICD-10-CM | POA: Diagnosis not present

## 2022-09-17 DIAGNOSIS — Z992 Dependence on renal dialysis: Secondary | ICD-10-CM | POA: Diagnosis not present

## 2022-09-17 DIAGNOSIS — D689 Coagulation defect, unspecified: Secondary | ICD-10-CM | POA: Diagnosis not present

## 2022-09-17 DIAGNOSIS — N186 End stage renal disease: Secondary | ICD-10-CM | POA: Diagnosis not present

## 2022-09-20 DIAGNOSIS — D631 Anemia in chronic kidney disease: Secondary | ICD-10-CM | POA: Diagnosis not present

## 2022-09-20 DIAGNOSIS — N2581 Secondary hyperparathyroidism of renal origin: Secondary | ICD-10-CM | POA: Diagnosis not present

## 2022-09-20 DIAGNOSIS — N186 End stage renal disease: Secondary | ICD-10-CM | POA: Diagnosis not present

## 2022-09-20 DIAGNOSIS — R52 Pain, unspecified: Secondary | ICD-10-CM | POA: Diagnosis not present

## 2022-09-20 DIAGNOSIS — D509 Iron deficiency anemia, unspecified: Secondary | ICD-10-CM | POA: Diagnosis not present

## 2022-09-20 DIAGNOSIS — D689 Coagulation defect, unspecified: Secondary | ICD-10-CM | POA: Diagnosis not present

## 2022-09-20 DIAGNOSIS — E1122 Type 2 diabetes mellitus with diabetic chronic kidney disease: Secondary | ICD-10-CM | POA: Diagnosis not present

## 2022-09-20 DIAGNOSIS — L299 Pruritus, unspecified: Secondary | ICD-10-CM | POA: Diagnosis not present

## 2022-09-20 DIAGNOSIS — Z992 Dependence on renal dialysis: Secondary | ICD-10-CM | POA: Diagnosis not present

## 2022-09-23 DIAGNOSIS — L299 Pruritus, unspecified: Secondary | ICD-10-CM | POA: Diagnosis not present

## 2022-09-23 DIAGNOSIS — D631 Anemia in chronic kidney disease: Secondary | ICD-10-CM | POA: Diagnosis not present

## 2022-09-23 DIAGNOSIS — N2581 Secondary hyperparathyroidism of renal origin: Secondary | ICD-10-CM | POA: Diagnosis not present

## 2022-09-23 DIAGNOSIS — Z992 Dependence on renal dialysis: Secondary | ICD-10-CM | POA: Diagnosis not present

## 2022-09-23 DIAGNOSIS — D689 Coagulation defect, unspecified: Secondary | ICD-10-CM | POA: Diagnosis not present

## 2022-09-23 DIAGNOSIS — N186 End stage renal disease: Secondary | ICD-10-CM | POA: Diagnosis not present

## 2022-09-23 DIAGNOSIS — D509 Iron deficiency anemia, unspecified: Secondary | ICD-10-CM | POA: Diagnosis not present

## 2022-09-24 DIAGNOSIS — D509 Iron deficiency anemia, unspecified: Secondary | ICD-10-CM | POA: Diagnosis not present

## 2022-09-24 DIAGNOSIS — L299 Pruritus, unspecified: Secondary | ICD-10-CM | POA: Diagnosis not present

## 2022-09-24 DIAGNOSIS — Z992 Dependence on renal dialysis: Secondary | ICD-10-CM | POA: Diagnosis not present

## 2022-09-24 DIAGNOSIS — N186 End stage renal disease: Secondary | ICD-10-CM | POA: Diagnosis not present

## 2022-09-24 DIAGNOSIS — D689 Coagulation defect, unspecified: Secondary | ICD-10-CM | POA: Diagnosis not present

## 2022-09-24 DIAGNOSIS — N2581 Secondary hyperparathyroidism of renal origin: Secondary | ICD-10-CM | POA: Diagnosis not present

## 2022-09-24 DIAGNOSIS — D631 Anemia in chronic kidney disease: Secondary | ICD-10-CM | POA: Diagnosis not present

## 2022-09-27 DIAGNOSIS — D689 Coagulation defect, unspecified: Secondary | ICD-10-CM | POA: Diagnosis not present

## 2022-09-27 DIAGNOSIS — N186 End stage renal disease: Secondary | ICD-10-CM | POA: Diagnosis not present

## 2022-09-27 DIAGNOSIS — Z992 Dependence on renal dialysis: Secondary | ICD-10-CM | POA: Diagnosis not present

## 2022-09-27 DIAGNOSIS — L299 Pruritus, unspecified: Secondary | ICD-10-CM | POA: Diagnosis not present

## 2022-09-27 DIAGNOSIS — D509 Iron deficiency anemia, unspecified: Secondary | ICD-10-CM | POA: Diagnosis not present

## 2022-09-27 DIAGNOSIS — D631 Anemia in chronic kidney disease: Secondary | ICD-10-CM | POA: Diagnosis not present

## 2022-09-27 DIAGNOSIS — N2581 Secondary hyperparathyroidism of renal origin: Secondary | ICD-10-CM | POA: Diagnosis not present

## 2022-09-28 DIAGNOSIS — G894 Chronic pain syndrome: Secondary | ICD-10-CM | POA: Diagnosis not present

## 2022-09-28 DIAGNOSIS — N186 End stage renal disease: Secondary | ICD-10-CM | POA: Diagnosis not present

## 2022-09-28 DIAGNOSIS — G4733 Obstructive sleep apnea (adult) (pediatric): Secondary | ICD-10-CM | POA: Diagnosis not present

## 2022-09-28 DIAGNOSIS — M79643 Pain in unspecified hand: Secondary | ICD-10-CM | POA: Diagnosis not present

## 2022-09-28 DIAGNOSIS — M545 Low back pain, unspecified: Secondary | ICD-10-CM | POA: Diagnosis not present

## 2022-09-28 DIAGNOSIS — G546 Phantom limb syndrome with pain: Secondary | ICD-10-CM | POA: Diagnosis not present

## 2022-09-28 DIAGNOSIS — Z79891 Long term (current) use of opiate analgesic: Secondary | ICD-10-CM | POA: Diagnosis not present

## 2022-09-28 DIAGNOSIS — I159 Secondary hypertension, unspecified: Secondary | ICD-10-CM | POA: Diagnosis not present

## 2022-09-28 DIAGNOSIS — G890 Central pain syndrome: Secondary | ICD-10-CM | POA: Diagnosis not present

## 2022-09-28 DIAGNOSIS — M25519 Pain in unspecified shoulder: Secondary | ICD-10-CM | POA: Diagnosis not present

## 2022-09-28 DIAGNOSIS — Z992 Dependence on renal dialysis: Secondary | ICD-10-CM | POA: Diagnosis not present

## 2022-09-28 DIAGNOSIS — M25562 Pain in left knee: Secondary | ICD-10-CM | POA: Diagnosis not present

## 2022-09-29 DIAGNOSIS — D509 Iron deficiency anemia, unspecified: Secondary | ICD-10-CM | POA: Diagnosis not present

## 2022-09-29 DIAGNOSIS — L299 Pruritus, unspecified: Secondary | ICD-10-CM | POA: Diagnosis not present

## 2022-09-29 DIAGNOSIS — Z992 Dependence on renal dialysis: Secondary | ICD-10-CM | POA: Diagnosis not present

## 2022-09-29 DIAGNOSIS — D689 Coagulation defect, unspecified: Secondary | ICD-10-CM | POA: Diagnosis not present

## 2022-09-29 DIAGNOSIS — D631 Anemia in chronic kidney disease: Secondary | ICD-10-CM | POA: Diagnosis not present

## 2022-09-29 DIAGNOSIS — N186 End stage renal disease: Secondary | ICD-10-CM | POA: Diagnosis not present

## 2022-09-29 DIAGNOSIS — N2581 Secondary hyperparathyroidism of renal origin: Secondary | ICD-10-CM | POA: Diagnosis not present

## 2022-10-01 DIAGNOSIS — N186 End stage renal disease: Secondary | ICD-10-CM | POA: Diagnosis not present

## 2022-10-01 DIAGNOSIS — D509 Iron deficiency anemia, unspecified: Secondary | ICD-10-CM | POA: Diagnosis not present

## 2022-10-01 DIAGNOSIS — Z992 Dependence on renal dialysis: Secondary | ICD-10-CM | POA: Diagnosis not present

## 2022-10-01 DIAGNOSIS — N2581 Secondary hyperparathyroidism of renal origin: Secondary | ICD-10-CM | POA: Diagnosis not present

## 2022-10-01 DIAGNOSIS — L299 Pruritus, unspecified: Secondary | ICD-10-CM | POA: Diagnosis not present

## 2022-10-01 DIAGNOSIS — D631 Anemia in chronic kidney disease: Secondary | ICD-10-CM | POA: Diagnosis not present

## 2022-10-01 DIAGNOSIS — D689 Coagulation defect, unspecified: Secondary | ICD-10-CM | POA: Diagnosis not present

## 2022-10-03 DIAGNOSIS — E113512 Type 2 diabetes mellitus with proliferative diabetic retinopathy with macular edema, left eye: Secondary | ICD-10-CM | POA: Diagnosis not present

## 2022-10-04 DIAGNOSIS — N2581 Secondary hyperparathyroidism of renal origin: Secondary | ICD-10-CM | POA: Diagnosis not present

## 2022-10-04 DIAGNOSIS — D509 Iron deficiency anemia, unspecified: Secondary | ICD-10-CM | POA: Diagnosis not present

## 2022-10-04 DIAGNOSIS — D689 Coagulation defect, unspecified: Secondary | ICD-10-CM | POA: Diagnosis not present

## 2022-10-04 DIAGNOSIS — N186 End stage renal disease: Secondary | ICD-10-CM | POA: Diagnosis not present

## 2022-10-04 DIAGNOSIS — Z992 Dependence on renal dialysis: Secondary | ICD-10-CM | POA: Diagnosis not present

## 2022-10-04 DIAGNOSIS — D631 Anemia in chronic kidney disease: Secondary | ICD-10-CM | POA: Diagnosis not present

## 2022-10-04 DIAGNOSIS — L299 Pruritus, unspecified: Secondary | ICD-10-CM | POA: Diagnosis not present

## 2022-10-06 DIAGNOSIS — D509 Iron deficiency anemia, unspecified: Secondary | ICD-10-CM | POA: Diagnosis not present

## 2022-10-06 DIAGNOSIS — N2581 Secondary hyperparathyroidism of renal origin: Secondary | ICD-10-CM | POA: Diagnosis not present

## 2022-10-06 DIAGNOSIS — D631 Anemia in chronic kidney disease: Secondary | ICD-10-CM | POA: Diagnosis not present

## 2022-10-06 DIAGNOSIS — L299 Pruritus, unspecified: Secondary | ICD-10-CM | POA: Diagnosis not present

## 2022-10-06 DIAGNOSIS — N186 End stage renal disease: Secondary | ICD-10-CM | POA: Diagnosis not present

## 2022-10-06 DIAGNOSIS — Z992 Dependence on renal dialysis: Secondary | ICD-10-CM | POA: Diagnosis not present

## 2022-10-06 DIAGNOSIS — D689 Coagulation defect, unspecified: Secondary | ICD-10-CM | POA: Diagnosis not present

## 2022-10-08 DIAGNOSIS — D689 Coagulation defect, unspecified: Secondary | ICD-10-CM | POA: Diagnosis not present

## 2022-10-08 DIAGNOSIS — D509 Iron deficiency anemia, unspecified: Secondary | ICD-10-CM | POA: Diagnosis not present

## 2022-10-08 DIAGNOSIS — N2581 Secondary hyperparathyroidism of renal origin: Secondary | ICD-10-CM | POA: Diagnosis not present

## 2022-10-08 DIAGNOSIS — Z992 Dependence on renal dialysis: Secondary | ICD-10-CM | POA: Diagnosis not present

## 2022-10-08 DIAGNOSIS — N186 End stage renal disease: Secondary | ICD-10-CM | POA: Diagnosis not present

## 2022-10-08 DIAGNOSIS — L299 Pruritus, unspecified: Secondary | ICD-10-CM | POA: Diagnosis not present

## 2022-10-08 DIAGNOSIS — D631 Anemia in chronic kidney disease: Secondary | ICD-10-CM | POA: Diagnosis not present

## 2022-10-11 DIAGNOSIS — N2581 Secondary hyperparathyroidism of renal origin: Secondary | ICD-10-CM | POA: Diagnosis not present

## 2022-10-11 DIAGNOSIS — L299 Pruritus, unspecified: Secondary | ICD-10-CM | POA: Diagnosis not present

## 2022-10-11 DIAGNOSIS — D509 Iron deficiency anemia, unspecified: Secondary | ICD-10-CM | POA: Diagnosis not present

## 2022-10-11 DIAGNOSIS — D631 Anemia in chronic kidney disease: Secondary | ICD-10-CM | POA: Diagnosis not present

## 2022-10-11 DIAGNOSIS — Z992 Dependence on renal dialysis: Secondary | ICD-10-CM | POA: Diagnosis not present

## 2022-10-11 DIAGNOSIS — D689 Coagulation defect, unspecified: Secondary | ICD-10-CM | POA: Diagnosis not present

## 2022-10-11 DIAGNOSIS — N186 End stage renal disease: Secondary | ICD-10-CM | POA: Diagnosis not present

## 2022-10-13 DIAGNOSIS — N2581 Secondary hyperparathyroidism of renal origin: Secondary | ICD-10-CM | POA: Diagnosis not present

## 2022-10-13 DIAGNOSIS — L299 Pruritus, unspecified: Secondary | ICD-10-CM | POA: Diagnosis not present

## 2022-10-13 DIAGNOSIS — D509 Iron deficiency anemia, unspecified: Secondary | ICD-10-CM | POA: Diagnosis not present

## 2022-10-13 DIAGNOSIS — Z992 Dependence on renal dialysis: Secondary | ICD-10-CM | POA: Diagnosis not present

## 2022-10-13 DIAGNOSIS — D631 Anemia in chronic kidney disease: Secondary | ICD-10-CM | POA: Diagnosis not present

## 2022-10-13 DIAGNOSIS — D689 Coagulation defect, unspecified: Secondary | ICD-10-CM | POA: Diagnosis not present

## 2022-10-13 DIAGNOSIS — N186 End stage renal disease: Secondary | ICD-10-CM | POA: Diagnosis not present

## 2022-10-15 DIAGNOSIS — D631 Anemia in chronic kidney disease: Secondary | ICD-10-CM | POA: Diagnosis not present

## 2022-10-15 DIAGNOSIS — D509 Iron deficiency anemia, unspecified: Secondary | ICD-10-CM | POA: Diagnosis not present

## 2022-10-15 DIAGNOSIS — D689 Coagulation defect, unspecified: Secondary | ICD-10-CM | POA: Diagnosis not present

## 2022-10-15 DIAGNOSIS — Z992 Dependence on renal dialysis: Secondary | ICD-10-CM | POA: Diagnosis not present

## 2022-10-15 DIAGNOSIS — N2581 Secondary hyperparathyroidism of renal origin: Secondary | ICD-10-CM | POA: Diagnosis not present

## 2022-10-15 DIAGNOSIS — L299 Pruritus, unspecified: Secondary | ICD-10-CM | POA: Diagnosis not present

## 2022-10-15 DIAGNOSIS — N186 End stage renal disease: Secondary | ICD-10-CM | POA: Diagnosis not present

## 2022-10-18 DIAGNOSIS — D509 Iron deficiency anemia, unspecified: Secondary | ICD-10-CM | POA: Diagnosis not present

## 2022-10-18 DIAGNOSIS — Z992 Dependence on renal dialysis: Secondary | ICD-10-CM | POA: Diagnosis not present

## 2022-10-18 DIAGNOSIS — N2581 Secondary hyperparathyroidism of renal origin: Secondary | ICD-10-CM | POA: Diagnosis not present

## 2022-10-18 DIAGNOSIS — D689 Coagulation defect, unspecified: Secondary | ICD-10-CM | POA: Diagnosis not present

## 2022-10-18 DIAGNOSIS — N186 End stage renal disease: Secondary | ICD-10-CM | POA: Diagnosis not present

## 2022-10-18 DIAGNOSIS — L299 Pruritus, unspecified: Secondary | ICD-10-CM | POA: Diagnosis not present

## 2022-10-18 DIAGNOSIS — D631 Anemia in chronic kidney disease: Secondary | ICD-10-CM | POA: Diagnosis not present

## 2022-10-20 DIAGNOSIS — D509 Iron deficiency anemia, unspecified: Secondary | ICD-10-CM | POA: Diagnosis not present

## 2022-10-20 DIAGNOSIS — Z992 Dependence on renal dialysis: Secondary | ICD-10-CM | POA: Diagnosis not present

## 2022-10-20 DIAGNOSIS — L299 Pruritus, unspecified: Secondary | ICD-10-CM | POA: Diagnosis not present

## 2022-10-20 DIAGNOSIS — N2581 Secondary hyperparathyroidism of renal origin: Secondary | ICD-10-CM | POA: Diagnosis not present

## 2022-10-20 DIAGNOSIS — D689 Coagulation defect, unspecified: Secondary | ICD-10-CM | POA: Diagnosis not present

## 2022-10-20 DIAGNOSIS — N186 End stage renal disease: Secondary | ICD-10-CM | POA: Diagnosis not present

## 2022-10-20 DIAGNOSIS — D631 Anemia in chronic kidney disease: Secondary | ICD-10-CM | POA: Diagnosis not present

## 2022-10-21 DIAGNOSIS — Z992 Dependence on renal dialysis: Secondary | ICD-10-CM | POA: Diagnosis not present

## 2022-10-21 DIAGNOSIS — N186 End stage renal disease: Secondary | ICD-10-CM | POA: Diagnosis not present

## 2022-10-21 DIAGNOSIS — E1122 Type 2 diabetes mellitus with diabetic chronic kidney disease: Secondary | ICD-10-CM | POA: Diagnosis not present

## 2022-10-22 DIAGNOSIS — N2581 Secondary hyperparathyroidism of renal origin: Secondary | ICD-10-CM | POA: Diagnosis not present

## 2022-10-22 DIAGNOSIS — D509 Iron deficiency anemia, unspecified: Secondary | ICD-10-CM | POA: Diagnosis not present

## 2022-10-22 DIAGNOSIS — N186 End stage renal disease: Secondary | ICD-10-CM | POA: Diagnosis not present

## 2022-10-22 DIAGNOSIS — Z992 Dependence on renal dialysis: Secondary | ICD-10-CM | POA: Diagnosis not present

## 2022-10-22 DIAGNOSIS — L299 Pruritus, unspecified: Secondary | ICD-10-CM | POA: Diagnosis not present

## 2022-10-22 DIAGNOSIS — R197 Diarrhea, unspecified: Secondary | ICD-10-CM | POA: Diagnosis not present

## 2022-10-22 DIAGNOSIS — D631 Anemia in chronic kidney disease: Secondary | ICD-10-CM | POA: Diagnosis not present

## 2022-10-22 DIAGNOSIS — D689 Coagulation defect, unspecified: Secondary | ICD-10-CM | POA: Diagnosis not present

## 2022-10-24 ENCOUNTER — Encounter: Payer: Self-pay | Admitting: Student

## 2022-10-25 ENCOUNTER — Other Ambulatory Visit: Payer: Self-pay

## 2022-10-25 DIAGNOSIS — N186 End stage renal disease: Secondary | ICD-10-CM | POA: Diagnosis not present

## 2022-10-25 DIAGNOSIS — D631 Anemia in chronic kidney disease: Secondary | ICD-10-CM | POA: Diagnosis not present

## 2022-10-25 DIAGNOSIS — N2581 Secondary hyperparathyroidism of renal origin: Secondary | ICD-10-CM | POA: Diagnosis not present

## 2022-10-25 DIAGNOSIS — Z992 Dependence on renal dialysis: Secondary | ICD-10-CM | POA: Diagnosis not present

## 2022-10-25 DIAGNOSIS — R197 Diarrhea, unspecified: Secondary | ICD-10-CM | POA: Diagnosis not present

## 2022-10-25 DIAGNOSIS — D509 Iron deficiency anemia, unspecified: Secondary | ICD-10-CM | POA: Diagnosis not present

## 2022-10-25 DIAGNOSIS — D689 Coagulation defect, unspecified: Secondary | ICD-10-CM | POA: Diagnosis not present

## 2022-10-25 DIAGNOSIS — L299 Pruritus, unspecified: Secondary | ICD-10-CM | POA: Diagnosis not present

## 2022-10-26 ENCOUNTER — Other Ambulatory Visit: Payer: Self-pay | Admitting: Student

## 2022-10-26 NOTE — Telephone Encounter (Signed)
Can we please call [name] [name] to confirm she is still taking her Amitriptyline?   Thanks

## 2022-10-27 ENCOUNTER — Encounter: Payer: Self-pay | Admitting: Student

## 2022-10-27 DIAGNOSIS — L299 Pruritus, unspecified: Secondary | ICD-10-CM | POA: Diagnosis not present

## 2022-10-27 DIAGNOSIS — D631 Anemia in chronic kidney disease: Secondary | ICD-10-CM | POA: Diagnosis not present

## 2022-10-27 DIAGNOSIS — N2581 Secondary hyperparathyroidism of renal origin: Secondary | ICD-10-CM | POA: Diagnosis not present

## 2022-10-27 DIAGNOSIS — R197 Diarrhea, unspecified: Secondary | ICD-10-CM | POA: Diagnosis not present

## 2022-10-27 DIAGNOSIS — Z992 Dependence on renal dialysis: Secondary | ICD-10-CM | POA: Diagnosis not present

## 2022-10-27 DIAGNOSIS — N186 End stage renal disease: Secondary | ICD-10-CM | POA: Diagnosis not present

## 2022-10-27 DIAGNOSIS — D509 Iron deficiency anemia, unspecified: Secondary | ICD-10-CM | POA: Diagnosis not present

## 2022-10-27 DIAGNOSIS — D689 Coagulation defect, unspecified: Secondary | ICD-10-CM | POA: Diagnosis not present

## 2022-10-27 NOTE — Telephone Encounter (Signed)
Called patient x2 unable to reach patient, unable to leave a voicemail.

## 2022-10-29 DIAGNOSIS — D509 Iron deficiency anemia, unspecified: Secondary | ICD-10-CM | POA: Diagnosis not present

## 2022-10-29 DIAGNOSIS — D689 Coagulation defect, unspecified: Secondary | ICD-10-CM | POA: Diagnosis not present

## 2022-10-29 DIAGNOSIS — L299 Pruritus, unspecified: Secondary | ICD-10-CM | POA: Diagnosis not present

## 2022-10-29 DIAGNOSIS — D631 Anemia in chronic kidney disease: Secondary | ICD-10-CM | POA: Diagnosis not present

## 2022-10-29 DIAGNOSIS — R197 Diarrhea, unspecified: Secondary | ICD-10-CM | POA: Diagnosis not present

## 2022-10-29 DIAGNOSIS — N186 End stage renal disease: Secondary | ICD-10-CM | POA: Diagnosis not present

## 2022-10-29 DIAGNOSIS — N2581 Secondary hyperparathyroidism of renal origin: Secondary | ICD-10-CM | POA: Diagnosis not present

## 2022-10-29 DIAGNOSIS — Z992 Dependence on renal dialysis: Secondary | ICD-10-CM | POA: Diagnosis not present

## 2022-10-30 ENCOUNTER — Other Ambulatory Visit: Payer: Self-pay

## 2022-10-30 ENCOUNTER — Encounter (HOSPITAL_COMMUNITY): Payer: Self-pay | Admitting: Emergency Medicine

## 2022-10-30 ENCOUNTER — Emergency Department (HOSPITAL_COMMUNITY): Payer: 59

## 2022-10-30 ENCOUNTER — Other Ambulatory Visit (HOSPITAL_COMMUNITY): Payer: 59

## 2022-10-30 ENCOUNTER — Inpatient Hospital Stay (HOSPITAL_COMMUNITY)
Admission: EM | Admit: 2022-10-30 | Discharge: 2022-11-02 | DRG: 280 | Disposition: A | Discharge status: Home / Self Care | Payer: 59 | Attending: Internal Medicine | Admitting: Internal Medicine

## 2022-10-30 DIAGNOSIS — Z743 Need for continuous supervision: Secondary | ICD-10-CM | POA: Diagnosis not present

## 2022-10-30 DIAGNOSIS — E119 Type 2 diabetes mellitus without complications: Secondary | ICD-10-CM

## 2022-10-30 DIAGNOSIS — E1140 Type 2 diabetes mellitus with diabetic neuropathy, unspecified: Secondary | ICD-10-CM | POA: Diagnosis not present

## 2022-10-30 DIAGNOSIS — Z8249 Family history of ischemic heart disease and other diseases of the circulatory system: Secondary | ICD-10-CM

## 2022-10-30 DIAGNOSIS — I2489 Other forms of acute ischemic heart disease: Secondary | ICD-10-CM | POA: Diagnosis not present

## 2022-10-30 DIAGNOSIS — E1151 Type 2 diabetes mellitus with diabetic peripheral angiopathy without gangrene: Secondary | ICD-10-CM

## 2022-10-30 DIAGNOSIS — M797 Fibromyalgia: Secondary | ICD-10-CM | POA: Diagnosis not present

## 2022-10-30 DIAGNOSIS — Z992 Dependence on renal dialysis: Secondary | ICD-10-CM

## 2022-10-30 DIAGNOSIS — M898X9 Other specified disorders of bone, unspecified site: Secondary | ICD-10-CM | POA: Diagnosis present

## 2022-10-30 DIAGNOSIS — D631 Anemia in chronic kidney disease: Secondary | ICD-10-CM | POA: Diagnosis not present

## 2022-10-30 DIAGNOSIS — G8929 Other chronic pain: Secondary | ICD-10-CM | POA: Diagnosis not present

## 2022-10-30 DIAGNOSIS — Z833 Family history of diabetes mellitus: Secondary | ICD-10-CM

## 2022-10-30 DIAGNOSIS — I5032 Chronic diastolic (congestive) heart failure: Secondary | ICD-10-CM | POA: Diagnosis not present

## 2022-10-30 DIAGNOSIS — Z89612 Acquired absence of left leg above knee: Secondary | ICD-10-CM | POA: Diagnosis not present

## 2022-10-30 DIAGNOSIS — I160 Hypertensive urgency: Secondary | ICD-10-CM | POA: Diagnosis not present

## 2022-10-30 DIAGNOSIS — I10 Essential (primary) hypertension: Secondary | ICD-10-CM

## 2022-10-30 DIAGNOSIS — Z841 Family history of disorders of kidney and ureter: Secondary | ICD-10-CM

## 2022-10-30 DIAGNOSIS — Z89512 Acquired absence of left leg below knee: Secondary | ICD-10-CM

## 2022-10-30 DIAGNOSIS — R112 Nausea with vomiting, unspecified: Secondary | ICD-10-CM

## 2022-10-30 DIAGNOSIS — Z87891 Personal history of nicotine dependence: Secondary | ICD-10-CM

## 2022-10-30 DIAGNOSIS — R1013 Epigastric pain: Secondary | ICD-10-CM | POA: Diagnosis not present

## 2022-10-30 DIAGNOSIS — E559 Vitamin D deficiency, unspecified: Secondary | ICD-10-CM | POA: Diagnosis not present

## 2022-10-30 DIAGNOSIS — R7989 Other specified abnormal findings of blood chemistry: Secondary | ICD-10-CM

## 2022-10-30 DIAGNOSIS — R030 Elevated blood-pressure reading, without diagnosis of hypertension: Secondary | ICD-10-CM

## 2022-10-30 DIAGNOSIS — E1169 Type 2 diabetes mellitus with other specified complication: Secondary | ICD-10-CM | POA: Diagnosis present

## 2022-10-30 DIAGNOSIS — Z881 Allergy status to other antibiotic agents status: Secondary | ICD-10-CM

## 2022-10-30 DIAGNOSIS — Z7985 Long-term (current) use of injectable non-insulin antidiabetic drugs: Secondary | ICD-10-CM | POA: Diagnosis not present

## 2022-10-30 DIAGNOSIS — R6889 Other general symptoms and signs: Secondary | ICD-10-CM | POA: Diagnosis not present

## 2022-10-30 DIAGNOSIS — E785 Hyperlipidemia, unspecified: Secondary | ICD-10-CM | POA: Diagnosis present

## 2022-10-30 DIAGNOSIS — K5730 Diverticulosis of large intestine without perforation or abscess without bleeding: Secondary | ICD-10-CM | POA: Diagnosis present

## 2022-10-30 DIAGNOSIS — K8020 Calculus of gallbladder without cholecystitis without obstruction: Secondary | ICD-10-CM | POA: Diagnosis present

## 2022-10-30 DIAGNOSIS — I21A1 Myocardial infarction type 2: Secondary | ICD-10-CM | POA: Diagnosis present

## 2022-10-30 DIAGNOSIS — F419 Anxiety disorder, unspecified: Secondary | ICD-10-CM | POA: Diagnosis present

## 2022-10-30 DIAGNOSIS — R1111 Vomiting without nausea: Secondary | ICD-10-CM | POA: Diagnosis not present

## 2022-10-30 DIAGNOSIS — F319 Bipolar disorder, unspecified: Secondary | ICD-10-CM | POA: Diagnosis present

## 2022-10-30 DIAGNOSIS — I2510 Atherosclerotic heart disease of native coronary artery without angina pectoris: Secondary | ICD-10-CM | POA: Diagnosis present

## 2022-10-30 DIAGNOSIS — N281 Cyst of kidney, acquired: Secondary | ICD-10-CM | POA: Diagnosis present

## 2022-10-30 DIAGNOSIS — N186 End stage renal disease: Secondary | ICD-10-CM | POA: Diagnosis not present

## 2022-10-30 DIAGNOSIS — I953 Hypotension of hemodialysis: Secondary | ICD-10-CM | POA: Diagnosis not present

## 2022-10-30 DIAGNOSIS — R109 Unspecified abdominal pain: Secondary | ICD-10-CM | POA: Diagnosis not present

## 2022-10-30 DIAGNOSIS — I152 Hypertension secondary to endocrine disorders: Secondary | ICD-10-CM | POA: Diagnosis not present

## 2022-10-30 DIAGNOSIS — K8050 Calculus of bile duct without cholangitis or cholecystitis without obstruction: Secondary | ICD-10-CM | POA: Diagnosis not present

## 2022-10-30 DIAGNOSIS — E039 Hypothyroidism, unspecified: Secondary | ICD-10-CM | POA: Diagnosis present

## 2022-10-30 DIAGNOSIS — R079 Chest pain, unspecified: Secondary | ICD-10-CM | POA: Diagnosis present

## 2022-10-30 DIAGNOSIS — Z91148 Patient's other noncompliance with medication regimen for other reason: Secondary | ICD-10-CM

## 2022-10-30 DIAGNOSIS — R9431 Abnormal electrocardiogram [ECG] [EKG]: Secondary | ICD-10-CM | POA: Diagnosis present

## 2022-10-30 DIAGNOSIS — E1122 Type 2 diabetes mellitus with diabetic chronic kidney disease: Secondary | ICD-10-CM | POA: Diagnosis present

## 2022-10-30 DIAGNOSIS — Z6841 Body Mass Index (BMI) 40.0 and over, adult: Secondary | ICD-10-CM

## 2022-10-30 DIAGNOSIS — Z79891 Long term (current) use of opiate analgesic: Secondary | ICD-10-CM

## 2022-10-30 DIAGNOSIS — I214 Non-ST elevation (NSTEMI) myocardial infarction: Secondary | ICD-10-CM | POA: Diagnosis not present

## 2022-10-30 DIAGNOSIS — I120 Hypertensive chronic kidney disease with stage 5 chronic kidney disease or end stage renal disease: Secondary | ICD-10-CM | POA: Diagnosis not present

## 2022-10-30 DIAGNOSIS — G4733 Obstructive sleep apnea (adult) (pediatric): Secondary | ICD-10-CM | POA: Diagnosis present

## 2022-10-30 DIAGNOSIS — Z79890 Hormone replacement therapy (postmenopausal): Secondary | ICD-10-CM

## 2022-10-30 DIAGNOSIS — K429 Umbilical hernia without obstruction or gangrene: Secondary | ICD-10-CM | POA: Diagnosis present

## 2022-10-30 DIAGNOSIS — R1010 Upper abdominal pain, unspecified: Principal | ICD-10-CM

## 2022-10-30 DIAGNOSIS — Z79899 Other long term (current) drug therapy: Secondary | ICD-10-CM

## 2022-10-30 LAB — TROPONIN I (HIGH SENSITIVITY)
Troponin I (High Sensitivity): 8 ng/L (ref <18)
Troponin I (High Sensitivity): 67 ng/L — ABNORMAL HIGH (ref <18)

## 2022-10-30 LAB — COMPREHENSIVE METABOLIC PANEL
ALT: 18 U/L (ref 0–44)
AST: 21 U/L (ref 15–41)
Albumin: 4.1 g/dL (ref 3.5–5.0)
Alkaline Phosphatase: 150 U/L — ABNORMAL HIGH (ref 38–126)
Anion gap: 15 (ref 5–15)
BUN: 23 mg/dL — ABNORMAL HIGH (ref 6–20)
CO2: 25 mmol/L (ref 22–32)
Calcium: 10.1 mg/dL (ref 8.9–10.3)
Chloride: 95 mmol/L — ABNORMAL LOW (ref 98–111)
Creatinine, Ser: 6.23 mg/dL — ABNORMAL HIGH (ref 0.44–1.00)
GFR, Estimated: 8 mL/min — ABNORMAL LOW (ref >60)
Glucose, Bld: 191 mg/dL — ABNORMAL HIGH (ref 70–99)
Potassium: 4.1 mmol/L (ref 3.5–5.1)
Sodium: 135 mmol/L (ref 135–145)
Total Bilirubin: 0.9 mg/dL (ref 0.3–1.2)
Total Protein: 8.7 g/dL — ABNORMAL HIGH (ref 6.5–8.1)

## 2022-10-30 LAB — CBC WITH DIFFERENTIAL/PLATELET
Abs Immature Granulocytes: 0.04 10*3/uL (ref 0.00–0.07)
Basophils Absolute: 0 10*3/uL (ref 0.0–0.1)
Basophils Relative: 1 %
Eosinophils Absolute: 0 10*3/uL (ref 0.0–0.5)
Eosinophils Relative: 1 %
HCT: 40.9 % (ref 36.0–46.0)
Hemoglobin: 13.1 g/dL (ref 12.0–15.0)
Immature Granulocytes: 1 %
Lymphocytes Relative: 19 %
Lymphs Abs: 1.2 10*3/uL (ref 0.7–4.0)
MCH: 28.5 pg (ref 26.0–34.0)
MCHC: 32 g/dL (ref 30.0–36.0)
MCV: 88.9 fL (ref 80.0–100.0)
Monocytes Absolute: 0.4 10*3/uL (ref 0.1–1.0)
Monocytes Relative: 6 %
Neutro Abs: 4.7 10*3/uL (ref 1.7–7.7)
Neutrophils Relative %: 72 %
Platelets: 166 10*3/uL (ref 150–400)
RBC: 4.6 MIL/uL (ref 3.87–5.11)
RDW: 13.3 % (ref 11.5–15.5)
WBC: 6.4 10*3/uL (ref 4.0–10.5)
nRBC: 0 % (ref 0.0–0.2)

## 2022-10-30 LAB — RAPID URINE DRUG SCREEN, HOSP PERFORMED
Amphetamines: NOT DETECTED
Barbiturates: NOT DETECTED
Benzodiazepines: NOT DETECTED
Cocaine: NOT DETECTED
Opiates: NOT DETECTED
Tetrahydrocannabinol: POSITIVE — AB

## 2022-10-30 LAB — URINALYSIS, ROUTINE W REFLEX MICROSCOPIC
Bilirubin Urine: NEGATIVE
Glucose, UA: 150 mg/dL — AB
Hgb urine dipstick: NEGATIVE
Ketones, ur: 5 mg/dL — AB
Leukocytes,Ua: NEGATIVE
Nitrite: NEGATIVE
Protein, ur: 100 mg/dL — AB
Specific Gravity, Urine: 1.009 (ref 1.005–1.030)
pH: 9 — ABNORMAL HIGH (ref 5.0–8.0)

## 2022-10-30 LAB — I-STAT BETA HCG BLOOD, ED (MC, WL, AP ONLY): I-stat hCG, quantitative: <5 m[IU]/mL (ref <5)

## 2022-10-30 LAB — LIPASE, BLOOD: Lipase: 132 U/L — ABNORMAL HIGH (ref 11–51)

## 2022-10-30 LAB — CBG MONITORING, ED: Glucose-Capillary: 175 mg/dL — ABNORMAL HIGH (ref 70–99)

## 2022-10-30 MED ORDER — LEVOTHYROXINE SODIUM 88 MCG PO TABS: 88.0000 ug | ORAL_TABLET | Freq: Every day | ORAL | Status: DC

## 2022-10-30 MED ORDER — SODIUM CHLORIDE 0.9 % IV SOLN
8.0000 mg | Freq: Once | INTRAVENOUS | Status: DC
  Filled 2022-10-30: qty 4

## 2022-10-30 MED ORDER — METOPROLOL SUCCINATE ER 25 MG PO TB24
25.0000 mg | ORAL_TABLET | Freq: Every day | ORAL | Status: DC
  Administered 2022-10-31: 25 mg via ORAL
  Filled 2022-10-30: qty 1

## 2022-10-30 MED ORDER — HYDROMORPHONE HCL 1 MG/ML IJ SOLN
0.5000 mg | Freq: Once | INTRAMUSCULAR | Status: AC
  Administered 2022-10-30: 0.5 mg via INTRAVENOUS
  Filled 2022-10-30: qty 1

## 2022-10-30 MED ORDER — LABETALOL HCL 5 MG/ML IV SOLN
20.0000 mg | Freq: Once | INTRAVENOUS | Status: AC
  Administered 2022-10-30: 20 mg via INTRAVENOUS
  Filled 2022-10-30: qty 4

## 2022-10-30 MED ORDER — PANTOPRAZOLE SODIUM 40 MG IV SOLR
40.0000 mg | INTRAVENOUS | Status: DC
  Administered 2022-10-31 – 2022-11-02 (×3): 40 mg via INTRAVENOUS
  Filled 2022-10-30 (×3): qty 10

## 2022-10-30 MED ORDER — HEPARIN SODIUM (PORCINE) 5000 UNIT/ML IJ SOLN
5000.0000 [IU] | Freq: Three times a day (TID) | INTRAMUSCULAR | Status: DC
  Administered 2022-10-30: 5000 [IU] via SUBCUTANEOUS
  Filled 2022-10-30: qty 1

## 2022-10-30 MED ORDER — ALUM & MAG HYDROXIDE-SIMETH 200-200-20 MG/5ML PO SUSP
30.0000 mL | Freq: Once | ORAL | Status: AC
  Administered 2022-10-30: 30 mL via ORAL
  Filled 2022-10-30: qty 30

## 2022-10-30 MED ORDER — DIAZEPAM 5 MG/ML IJ SOLN
2.5000 mg | Freq: Once | INTRAMUSCULAR | Status: AC
  Administered 2022-10-30: 2.5 mg via INTRAVENOUS
  Filled 2022-10-30: qty 2

## 2022-10-30 MED ORDER — OXYCODONE-ACETAMINOPHEN 5-325 MG PO TABS
1.0000 | ORAL_TABLET | Freq: Once | ORAL | Status: AC
  Administered 2022-10-30: 1 via ORAL
  Filled 2022-10-30: qty 1

## 2022-10-30 MED ORDER — INSULIN GLARGINE-YFGN 100 UNIT/ML ~~LOC~~ SOLN
5.0000 [IU] | Freq: Every day | SUBCUTANEOUS | Status: DC
  Administered 2022-10-31 – 2022-11-01 (×3): 5 [IU] via SUBCUTANEOUS
  Filled 2022-10-30 (×5): qty 0.05

## 2022-10-30 MED ORDER — ONDANSETRON HCL 4 MG/2ML IJ SOLN
4.0000 mg | Freq: Once | INTRAMUSCULAR | Status: AC
  Administered 2022-10-30: 4 mg via INTRAVENOUS
  Filled 2022-10-30: qty 2

## 2022-10-30 MED ORDER — OXYCODONE HCL 5 MG PO TABS: 5.0000 mg | ORAL_TABLET | Freq: Two times a day (BID) | ORAL | Status: DC | PRN

## 2022-10-30 MED ORDER — HYDROMORPHONE HCL 1 MG/ML IJ SOLN: 1.0000 mg | Freq: Once | INTRAMUSCULAR | Status: DC

## 2022-10-30 MED ORDER — ASPIRIN 325 MG PO TABS
325.0000 mg | ORAL_TABLET | Freq: Every day | ORAL | Status: DC
  Administered 2022-10-31 (×2): 325 mg via ORAL
  Filled 2022-10-30 (×2): qty 1

## 2022-10-30 MED ORDER — ACETAMINOPHEN 500 MG PO TABS: 1000.0000 mg | ORAL_TABLET | Freq: Four times a day (QID) | ORAL | Status: DC

## 2022-10-30 MED ORDER — ATORVASTATIN CALCIUM 10 MG PO TABS: 20.0000 mg | ORAL_TABLET | Freq: Every day | ORAL | Status: DC

## 2022-10-30 MED ORDER — PANTOPRAZOLE SODIUM 40 MG IV SOLR
40.0000 mg | Freq: Once | INTRAVENOUS | Status: AC
  Administered 2022-10-30: 40 mg via INTRAVENOUS
  Filled 2022-10-30: qty 10

## 2022-10-30 MED ORDER — METOCLOPRAMIDE HCL 5 MG/ML IJ SOLN
10.0000 mg | Freq: Once | INTRAMUSCULAR | Status: AC
  Administered 2022-10-30: 10 mg via INTRAVENOUS
  Filled 2022-10-30: qty 2

## 2022-10-30 MED ORDER — FAMOTIDINE 20 MG PO TABS
20.0000 mg | ORAL_TABLET | Freq: Once | ORAL | Status: AC
  Administered 2022-10-30: 20 mg via ORAL
  Filled 2022-10-30: qty 1

## 2022-10-30 MED ORDER — ONDANSETRON 4 MG PO TBDP
4.0000 mg | ORAL_TABLET | Freq: Once | ORAL | Status: AC
  Administered 2022-10-30: 4 mg via ORAL
  Filled 2022-10-30: qty 1

## 2022-10-30 MED ORDER — AMLODIPINE BESYLATE 5 MG PO TABS: 10.0000 mg | ORAL_TABLET | Freq: Every day | ORAL | Status: DC

## 2022-10-30 MED ORDER — INSULIN ASPART 100 UNIT/ML IJ SOLN
0.0000 [IU] | INTRAMUSCULAR | Status: DC
  Administered 2022-10-30: 1 [IU] via SUBCUTANEOUS

## 2022-10-30 NOTE — ED Provider Notes (Addendum)
[place] EMERGENCY DEPARTMENT AT [facility] Provider Note   CSN: [id number] Arrival date & time: 10/30/22  1107     History  Chief Complaint  Patient presents with   Abdominal Pain   Emesis    [name] [name] is a 47 y.o. female.  Patient with hx esrd, hd t/th/sat, had normal dialysis yesterday, c/o abd pain in past 1-2 days. Mid to upper abd, dull, non radiating, without specific exacerbating or alleviating factors. Vomited x several, not bloody or bilious. Had normal bm yesterday. No dysuria. Does make urine 1-2x/day. No extremity pain or swelling. No fever or chills. No known bad food ingestion or ill contacts.   The history is provided by the patient, medical records and the EMS personnel.  Abdominal Pain Associated symptoms: nausea and vomiting   Associated symptoms: no chest pain, no chills, no constipation, no cough, no diarrhea, no dysuria, no fever, no shortness of breath, no sore throat, no vaginal bleeding and no vaginal discharge   Emesis Associated symptoms: abdominal pain   Associated symptoms: no chills, no cough, no diarrhea, no fever, no headaches and no sore throat        Home Medications Prior to Admission medications   Medication Sig Start Date End Date Taking? Authorizing Provider  allopurinol (ZYLOPRIM) 100 MG tablet Take 1 tablet by mouth 3 times a week. Take only after dialysis sessions. Do not take on non-dialysis days. 09/12/22   Katsadouros, Vasilios, MD  amitriptyline (ELAVIL) 100 MG tablet Take 100 mg by mouth at bedtime.    [provider]  amLODipine (NORVASC) 10 MG tablet TAKE 1 TABLET BY MOUTH EVERY DAY 05/19/22   Belva Agee, MD  atorvastatin (LIPITOR) 20 MG tablet Take 1 tablet (20 mg total) by mouth daily. 07/21/22   Katsadouros, Vasilios, MD  B Complex-C-Folic Acid (DIALYVITE 800) 0.8 MG TABS Take 0.8 mg by mouth daily.    [provider]  Benzoyl Peroxide 2.5 % CREA Apply 1 drop topically daily.  07/21/22   Belva Agee, MD  bismuth subsalicylate (PEPTO BISMOL) 262 MG/15ML suspension Take 30 mLs by mouth every 6 (six) hours as needed for indigestion or diarrhea or loose stools. Patient not taking: Reported on 12/10/2021    [provider]  blood glucose meter kit and supplies KIT ICD 10- E11.69. Based on patient and insurance preference. Use up to four times daily as directed. 05/01/18   Love, Evlyn Kanner, PA-C  buPROPion (WELLBUTRIN) 100 MG tablet Take 1 tablet (100 mg total) by mouth every other day. 07/21/22   Belva Agee, MD  calcium acetate (PHOSLO) 667 MG capsule Take 1 capsule (667 mg total) by mouth 3 (three) times daily with meals. 01/12/21   Willeen Niece, MD  cyclobenzaprine (FLEXERIL) 5 MG tablet Take 1 tablet by mouth daily as needed for muscle spasms. 10/07/21   Katsadouros, Vasilios, MD  diclofenac Sodium (VOLTAREN) 1 % GEL APPLY 1 GRAM TOPICALLY TO AFFECTED AREA 4 TIMES DAILY AS NEEDED 02/02/22   Katsadouros, Vasilios, MD  fluconazole (DIFLUCAN) 150 MG tablet Take 1 pill on day 1 and another pill 3 days later. TAKE AFTER DIALYSIS 07/21/22   Belva Agee, MD  fluticasone (FLONASE) 50 MCG/ACT nasal spray Place 1 spray into both nostrils daily. 07/21/22 07/21/23  Belva Agee, MD  folic acid-vitamin b complex-vitamin c-selenium-zinc (DIALYVITE) 3 MG TABS tablet Take 1 tablet by mouth daily.    [provider]  furosemide (LASIX) 20 MG tablet Take 1 tablet by mouth daily.  Take on non-dialysis days. Adjust per kidney docs 03/03/22   Belva Agee, MD  hydrocortisone 2.5 % lotion Apply to affected area 2 times daily for 2-4 weeks 12/06/21 12/06/22  Belva Agee, MD  hydrOXYzine (ATARAX) 25 MG tablet TAKE 1/2 TABLET BY MOUTH EVERY 6 HOURS AS NEEDED FOR ANXIETY 07/05/22   Katsadouros, Vasilios, MD  Insulin Pen Needle (TRUEPLUS PEN NEEDLES) 31G X 5 MM MISC 1 each by Other route 3 (three) times daily. 09/23/21   Katsadouros,  Vasilios, MD  ketoconazole (NIZORAL) 2 % cream Apply 1 application topically to affected area daily. 09/23/21   Belva Agee, MD  levothyroxine (SYNTHROID) 88 MCG tablet Take 88 mcg by mouth daily. 12/08/20   [provider]  metoprolol succinate (TOPROL-XL) 25 MG 24 hr tablet Take 1 tablet (25 mg total) by mouth daily. 07/21/22   Belva Agee, MD  mupirocin ointment (BACTROBAN) 2 % APPLY TO AFFECTED AREA(S) DAILY AS NEEDED 07/19/22   Katsadouros, Vasilios, MD  oxyCODONE-acetaminophen (PERCOCET/ROXICET) 5-325 MG tablet TAKE 1/2 TABLET BY MOUTH 2 TIMES DAILY AS NEEDED FOR SEVERE PAIN Patient taking differently: Take 1 tablet by mouth. TAKE 1 TABLET BY MOUTH 2 TIMES DAILY AS NEEDED FOR SEVERE PAIN 04/07/22   Belva Agee, MD  sevelamer carbonate (RENVELA) 800 MG tablet Take 1,600 mg by mouth 3 (three) times daily with meals. 01/26/21   [provider]  tirzepatide Greggory Keen) 10 MG/0.5ML Pen Inject 5 mg into the skin once a week. Takes on Saturday. 09/12/22   Katsadouros, Vasilios, MD  tretinoin (RETIN-A) 0.025 % cream Apply topically at bedtime. 07/20/22   Belva Agee, MD      Allergies    Vancomycin    Review of Systems   Review of Systems  Constitutional:  Negative for chills and fever.  HENT:  Negative for sore throat.   Eyes:  Negative for redness.  Respiratory:  Negative for cough and shortness of breath.   Cardiovascular:  Negative for chest pain.  Gastrointestinal:  Positive for abdominal pain, nausea and vomiting. Negative for blood in stool, constipation and diarrhea.  Genitourinary:  Negative for dysuria, flank pain, vaginal bleeding and vaginal discharge.  Musculoskeletal:  Negative for back pain and neck pain.  Skin:  Negative for rash.  Neurological:  Negative for headaches.  Hematological:  Does not bruise/bleed easily.  Psychiatric/Behavioral:  Negative for confusion.     Physical Exam Updated Vital Signs BP (!) 220/127    Pulse (!) 104   Temp 98.1 F (36.7 C) (Oral)   Resp 19   Ht 1.727 m (5\' 8" )   Wt 131.1 kg   SpO2 92%   BMI 43.94 kg/m  Physical Exam Vitals and nursing note reviewed.  Constitutional:      Appearance: Normal appearance. She is well-developed.  HENT:     Head: Atraumatic.     Nose: Nose normal.     Mouth/Throat:     Mouth: Mucous membranes are moist.  Eyes:     General: No scleral icterus.    Conjunctiva/sclera: Conjunctivae normal.  Neck:     Trachea: No tracheal deviation.  Cardiovascular:     Rate and Rhythm: Normal rate and regular rhythm.     Pulses: Normal pulses.     Heart sounds: Normal heart sounds. No murmur heard.    No friction rub. No gallop.  Pulmonary:     Effort: Pulmonary effort is normal. No respiratory distress.     Breath sounds: Normal breath sounds.  Abdominal:  General: Bowel sounds are normal. There is no distension.     Palpations: Abdomen is soft. There is no mass.     Tenderness: There is abdominal tenderness. There is no guarding or rebound.     Hernia: No hernia is present.     Comments: Mid abd tenderness.   Genitourinary:    Comments: No cva tenderness.  Musculoskeletal:        General: No swelling.     Cervical back: Normal range of motion and neck supple. No rigidity. No muscular tenderness.     Comments: Left aka. No gross edema to leg.  Skin:    General: Skin is warm and dry.     Findings: No rash.  Neurological:     Mental Status: She is alert.     Comments: Alert, speech normal.   Psychiatric:        Mood and Affect: Mood normal.     ED Results / Procedures / Treatments   Labs (all labs ordered are listed, but only abnormal results are displayed) Results for orders placed or performed during the hospital encounter of 10/30/22  Comprehensive metabolic panel  Result Value Ref Range   Sodium 135 135 - 145 mmol/L   Potassium 4.1 3.5 - 5.1 mmol/L   Chloride 95 (L) 98 - 111 mmol/L   CO2 25 22 - 32 mmol/L   Glucose, Bld  191 (H) 70 - 99 mg/dL   BUN 23 (H) 6 - 20 mg/dL   Creatinine, Ser 9.81 (H) 0.44 - 1.00 mg/dL   Calcium 19.1 8.9 - 47.8 mg/dL   Total Protein 8.7 (H) 6.5 - 8.1 g/dL   Albumin 4.1 3.5 - 5.0 g/dL   AST 21 15 - 41 U/L   ALT 18 0 - 44 U/L   Alkaline Phosphatase 150 (H) 38 - 126 U/L   Total Bilirubin 0.9 0.3 - 1.2 mg/dL   GFR, Estimated 8 (L) >60 mL/min   Anion gap 15 5 - 15  Lipase, blood  Result Value Ref Range   Lipase 132 (H) 11 - 51 U/L  CBC with Diff  Result Value Ref Range   WBC 6.4 4.0 - 10.5 K/uL   RBC 4.60 3.87 - 5.11 MIL/uL   Hemoglobin 13.1 12.0 - 15.0 g/dL   HCT 29.5 62.1 - 30.8 %   MCV 88.9 80.0 - 100.0 fL   MCH 28.5 26.0 - 34.0 pg   MCHC 32.0 30.0 - 36.0 g/dL   RDW 65.7 84.6 - 96.2 %   Platelets 166 150 - 400 K/uL   nRBC 0.0 0.0 - 0.2 %   Neutrophils Relative % 72 %   Neutro Abs 4.7 1.7 - 7.7 K/uL   Lymphocytes Relative 19 %   Lymphs Abs 1.2 0.7 - 4.0 K/uL   Monocytes Relative 6 %   Monocytes Absolute 0.4 0.1 - 1.0 K/uL   Eosinophils Relative 1 %   Eosinophils Absolute 0.0 0.0 - 0.5 K/uL   Basophils Relative 1 %   Basophils Absolute 0.0 0.0 - 0.1 K/uL   Immature Granulocytes 1 %   Abs Immature Granulocytes 0.04 0.00 - 0.07 K/uL  Urinalysis, Routine w reflex microscopic -Urine, Clean Catch  Result Value Ref Range   Color, Urine YELLOW YELLOW   APPearance CLOUDY (A) CLEAR   Specific Gravity, Urine 1.009 1.005 - 1.030   pH 9.0 (H) 5.0 - 8.0   Glucose, UA 150 (A) NEGATIVE mg/dL   Hgb urine dipstick NEGATIVE NEGATIVE  Bilirubin Urine NEGATIVE NEGATIVE   Ketones, ur 5 (A) NEGATIVE mg/dL   Protein, ur 161 (A) NEGATIVE mg/dL   Nitrite NEGATIVE NEGATIVE   Leukocytes,Ua NEGATIVE NEGATIVE   RBC / HPF 0-5 0 - 5 RBC/hpf   WBC, UA 0-5 0 - 5 WBC/hpf   Bacteria, UA RARE (A) NONE SEEN   Squamous Epithelial / HPF 21-50 0 - 5 /HPF  Rapid urine drug screen (hospital performed)  Result Value Ref Range   Opiates NONE DETECTED NONE DETECTED   Cocaine NONE DETECTED NONE  DETECTED   Benzodiazepines NONE DETECTED NONE DETECTED   Amphetamines NONE DETECTED NONE DETECTED   Tetrahydrocannabinol POSITIVE (A) NONE DETECTED   Barbiturates NONE DETECTED NONE DETECTED  I-Stat beta hCG blood, ED  Result Value Ref Range   I-stat hCG, quantitative <5.0 <5 mIU/mL   Comment 3          Troponin I (High Sensitivity)  Result Value Ref Range   Troponin I (High Sensitivity) 8 <18 ng/L  Troponin I (High Sensitivity)  Result Value Ref Range   Troponin I (High Sensitivity) 67 (H) <18 ng/L   *Note: Due to a large number of results and/or encounters for the requested time period, some results have not been displayed. A complete set of results can be found in Results Review.   CT ABDOMEN PELVIS WO CONTRAST  Result Date: 10/30/2022 CLINICAL DATA:  Abdominal pain. EXAM: CT ABDOMEN AND PELVIS WITHOUT CONTRAST TECHNIQUE: Multidetector CT imaging of the abdomen and pelvis was performed following the standard protocol without IV contrast. RADIATION DOSE REDUCTION: This exam was performed according to the departmental dose-optimization program which includes automated exposure control, adjustment of the mA and/or kV according to patient size and/or use of iterative reconstruction technique. COMPARISON:  December 31, 2020 FINDINGS: Lower chest: Moderate-severity atelectatic changes are seen within the bilateral lung bases. Hepatobiliary: No focal liver abnormality is seen. Numerous tiny gallstones are seen within the lumen of a mildly distended gallbladder. There is no evidence of gallbladder wall thickening, pericholecystic inflammation or biliary dilatation. Pancreas: Unremarkable. No pancreatic ductal dilatation or surrounding inflammatory changes. Spleen: Normal in size without focal abnormality. Adrenals/Urinary Tract: Adrenal glands are unremarkable. The kidneys are slightly small in size, without renal calculi or hydronephrosis. A 1.8 cm cyst is seen within the mid right kidney. A 7 mm lipoma  is seen within the posterior aspect of the mid left kidney. Bladder is unremarkable. Stomach/Bowel: Stomach is within normal limits. Appendix appears normal. No evidence of bowel wall thickening, distention, or inflammatory changes. Small, noninflamed diverticula are seen within the mid to distal sigmoid colon. Vascular/Lymphatic: Aortic atherosclerosis. No enlarged abdominal or pelvic lymph nodes. Reproductive: Uterus and bilateral adnexa are unremarkable. Other: There is a small, fat containing umbilical hernia. No abdominopelvic ascites. Musculoskeletal: No acute or significant osseous findings. IMPRESSION: 1. Cholelithiasis without evidence of acute cholecystitis. 2. Sigmoid diverticulosis. 3. Small, fat containing umbilical hernia. 4. Small right renal cyst and left renal lipoma. No follow-up imaging is recommended. This recommendation follows ACR consensus guidelines: Management of the Incidental Renal Mass on CT: A White Paper of the ACR Incidental Findings Committee. J Am Coll Radiol [phone]. 5. Moderate severity bibasilar atelectasis. 6. Aortic atherosclerosis. Aortic Atherosclerosis (ICD10-I70.0). Electronically Signed   By: Aram Candela M.D.   On: 10/30/2022 18:23   DG Chest 2 View  Result Date: 10/30/2022 CLINICAL DATA:  Upper abdominal pain. EXAM: CHEST - 2 VIEW COMPARISON:  01/05/2021 FINDINGS: Stable scarring right mid lung. No  focal pneumonia or substantial pleural effusion. Mild vascular congestion evident. The cardiopericardial silhouette is within normal limits for size. Vascular stent device overlies the left subclavian region. IMPRESSION: Vascular congestion. Component of underlying mild interstitial edema not excluded. Electronically Signed   By: Kennith Center M.D.   On: 10/30/2022 12:59     EKG EKG Interpretation  Date/Time:  Sunday October 30 2022 14:38:26 EDT Ventricular Rate:  91 PR Interval:  176 QRS Duration: 103 QT Interval:  401 QTC Calculation: 494 R  Axis:   42 Text Interpretation: Sinus rhythm No significant change since last tracing Confirmed by Cathren Laine ([id number]) on 10/30/2022 3:13:15 PM  Radiology CT ABDOMEN PELVIS WO CONTRAST  Result Date: 10/30/2022 CLINICAL DATA:  Abdominal pain. EXAM: CT ABDOMEN AND PELVIS WITHOUT CONTRAST TECHNIQUE: Multidetector CT imaging of the abdomen and pelvis was performed following the standard protocol without IV contrast. RADIATION DOSE REDUCTION: This exam was performed according to the departmental dose-optimization program which includes automated exposure control, adjustment of the mA and/or kV according to patient size and/or use of iterative reconstruction technique. COMPARISON:  December 31, 2020 FINDINGS: Lower chest: Moderate-severity atelectatic changes are seen within the bilateral lung bases. Hepatobiliary: No focal liver abnormality is seen. Numerous tiny gallstones are seen within the lumen of a mildly distended gallbladder. There is no evidence of gallbladder wall thickening, pericholecystic inflammation or biliary dilatation. Pancreas: Unremarkable. No pancreatic ductal dilatation or surrounding inflammatory changes. Spleen: Normal in size without focal abnormality. Adrenals/Urinary Tract: Adrenal glands are unremarkable. The kidneys are slightly small in size, without renal calculi or hydronephrosis. A 1.8 cm cyst is seen within the mid right kidney. A 7 mm lipoma is seen within the posterior aspect of the mid left kidney. Bladder is unremarkable. Stomach/Bowel: Stomach is within normal limits. Appendix appears normal. No evidence of bowel wall thickening, distention, or inflammatory changes. Small, noninflamed diverticula are seen within the mid to distal sigmoid colon. Vascular/Lymphatic: Aortic atherosclerosis. No enlarged abdominal or pelvic lymph nodes. Reproductive: Uterus and bilateral adnexa are unremarkable. Other: There is a small, fat containing umbilical hernia. No abdominopelvic ascites.  Musculoskeletal: No acute or significant osseous findings. IMPRESSION: 1. Cholelithiasis without evidence of acute cholecystitis. 2. Sigmoid diverticulosis. 3. Small, fat containing umbilical hernia. 4. Small right renal cyst and left renal lipoma. No follow-up imaging is recommended. This recommendation follows ACR consensus guidelines: Management of the Incidental Renal Mass on CT: A White Paper of the ACR Incidental Findings Committee. J Am Coll Radiol [phone]. 5. Moderate severity bibasilar atelectasis. 6. Aortic atherosclerosis. Aortic Atherosclerosis (ICD10-I70.0). Electronically Signed   By: Aram Candela M.D.   On: 10/30/2022 18:23   DG Chest 2 View  Result Date: 10/30/2022 CLINICAL DATA:  Upper abdominal pain. EXAM: CHEST - 2 VIEW COMPARISON:  01/05/2021 FINDINGS: Stable scarring right mid lung. No focal pneumonia or substantial pleural effusion. Mild vascular congestion evident. The cardiopericardial silhouette is within normal limits for size. Vascular stent device overlies the left subclavian region. IMPRESSION: Vascular congestion. Component of underlying mild interstitial edema not excluded. Electronically Signed   By: Kennith Center M.D.   On: 10/30/2022 12:59    Procedures Procedures    Medications Ordered in ED Medications  metoCLOPramide (REGLAN) injection 10 mg (has no administration in time range)  ondansetron (ZOFRAN-ODT) disintegrating tablet 4 mg (4 mg Oral Given 10/30/22 1520)  oxyCODONE-acetaminophen (PERCOCET/ROXICET) 5-325 MG per tablet 1 tablet (1 tablet Oral Given 10/30/22 1519)  HYDROmorphone (DILAUDID) injection 0.5 mg (0.5 mg Intravenous Given 10/30/22  1714)  ondansetron (ZOFRAN) injection 4 mg (4 mg Intravenous Given 10/30/22 1716)  pantoprazole (PROTONIX) injection 40 mg (40 mg Intravenous Given 10/30/22 1716)  diazepam (VALIUM) injection 2.5 mg (2.5 mg Intravenous Given 10/30/22 1927)  alum & mag hydroxide-simeth (MAALOX/MYLANTA) 200-200-20 MG/5ML suspension 30 mL  (30 mLs Oral Given 10/30/22 1928)  famotidine (PEPCID) tablet 20 mg (20 mg Oral Given 10/30/22 1928)  labetalol (NORMODYNE) injection 20 mg (20 mg Intravenous Given 10/30/22 1928)    ED Course/ Medical Decision Making/ A&P                             Medical Decision Making Problems Addressed: Diverticula, colon: chronic illness or injury Elevated blood pressure reading: acute illness or injury ESRD on dialysis [facility]): chronic illness or injury with exacerbation, progression, or side effects of treatment that poses a threat to life or bodily functions Essential hypertension: chronic illness or injury with exacerbation, progression, or side effects of treatment that poses a threat to life or bodily functions Gallstones: chronic illness or injury Nausea and vomiting in adult: acute illness or injury with systemic symptoms that poses a threat to life or bodily functions Renal cyst, right: chronic illness or injury Umbilical hernia without obstruction and without gangrene: chronic illness or injury Uncontrolled hypertension: acute illness or injury with systemic symptoms that poses a threat to life or bodily functions Upper abdominal pain: acute illness or injury with systemic symptoms that poses a threat to life or bodily functions  Amount and/or Complexity of Data Reviewed Independent Historian: EMS    Details: hx External Data Reviewed: labs, radiology and notes. Labs: ordered. Decision-making details documented in ED Course. Radiology: ordered and independent interpretation performed. Decision-making details documented in ED Course. ECG/medicine tests: ordered and independent interpretation performed. Decision-making details documented in ED Course. Discussion of management or test interpretation with external provider(s): Medicine.   Risk OTC drugs. Prescription drug management. Parenteral controlled substances. Decision regarding hospitalization.   Iv ns. Continuous pulse ox and  cardiac monitoring. Labs ordered/sent. Imaging ordered.   Differential diagnosis includes pancreatitis, pud, gastritis, etc. Dispo decision including potential need for admission considered - will get labs and imaging and reassess.   Reviewed nursing notes and prior charts for additional history. External reports reviewed. Additional history from: EMS.  Cardiac monitor: sinus rhythm, rate 90.  Labs reviewed/interpreted by me - wbc and hgb normal. Lipase mildly high.   Dilaudid iv, zofran iv.   CT reviewed/interpreted by me - no acute process. Gallstones (also noted on prior imaging).  Lfts normal. Wbc normal. No ruq tenderness.  Afebrile.   Uds also +THC, ?possible cannabinoid hyperemesis syndrome - will provide pt info.   Pt with recurrent/intractable nausea/vomiting. Additional ivf and nausea meds given. Appears mildly anxious, valium iv.   With earlier nausea/vomiting episodes, pt anxious, heart rate in 150 range, narrow complex. W reassurance/calming, ivf, nausea meds, hr 100, sinus. Delta trop mildly high. Repeat ecg. No chest pain or discomfort. ?possible demand related to tachycardia.   Given persistent/recurrent nv/upper abd pain, internal medicine consulted for admission.   CRITICAL CARE RE: intractable nv/abd pain, uncontrolled hypertension/hypertensive urgency requiring parenteral therapy, esrd/hd, elevated troponin.  Performed by: Suzi Roots Total critical care time: 40 minutes Critical care time was exclusive of separately billable procedures and treating other patients. Critical care was necessary to treat or prevent imminent or life-threatening deterioration. Critical care was time spent personally by me on the following  activities: development of treatment plan with patient and/or surrogate as well as nursing, discussions with consultants, evaluation of patient's response to treatment, examination of patient, obtaining history from patient or surrogate, ordering and  performing treatments and interventions, ordering and review of laboratory studies, ordering and review of radiographic studies, pulse oximetry and re-evaluation of patient's condition.        Final Clinical Impression(s) / ED Diagnoses Final diagnoses:  Upper abdominal pain  Nausea and vomiting in adult  ESRD on dialysis [facility])  Essential hypertension  Elevated blood pressure reading  Gallstones  Diverticula, colon  Umbilical hernia without obstruction and without gangrene  Renal cyst, right    Rx / DC Orders ED Discharge Orders     None          Cathren Laine, MD 10/30/22 1939

## 2022-10-30 NOTE — ED Notes (Signed)
Pt states she is hurting form the center of her chest to the bottom of her belly. U/t describe the sensation.

## 2022-10-30 NOTE — ED Notes (Signed)
ED TO INPATIENT HANDOFF REPORT  ED Nurse Name and Phone #: Cloyde Reams, RN [phone]  S Name/Age/Gender [name] [name] 47 y.o. female Room/Bed: 017C/017C  Code Status   Code Status: Full Code  Home/SNF/Other Home Patient oriented to: self, place, time, and situation Is this baseline? Yes   Triage Complete: Triage complete  Chief Complaint Chest pain [R07.9]  Triage Note Per [facility] pt coming from home c/o abdominal pain onset of 15 mins prior to their arrival. Having nausea and vomiting. Left  BKA. Actively vomiting in triage. Had dialysis yesterday.    Allergies Allergies  Allergen Reactions   Vancomycin Other (See Comments)    Acute renal failure suspected secondary to vanco    Level of Care/Admitting Diagnosis ED Disposition     ED Disposition  Admit   Condition  --   Comment  Hospital Area: [facility] [[id number]]  Level of Care: Telemetry Cardiac [[id number]]  May place patient in observation at [facility] or [facility] if equivalent level of care is available:: No  Covid Evaluation: Asymptomatic - no recent exposure (last 10 days) testing not required  Diagnosis: Chest pain [829562]  Admitting Physician: Inez Catalina [[id number]]  Attending Physician: Nena Polio          B Medical/Surgery History Past Medical History:  Diagnosis Date   Abdominal muscle pain 09/08/2016   Abnormal Pap smear of cervix 2009   Abscess    history of multiple abscesses   Acute bilateral low back pain 02/13/2017   Acute blood loss anemia    Hx  Iron infusions   Acute on chronic renal failure (HCC) 07/12/2012   Acute renal failure (HCC) 07/12/2012   Acute respiratory failure with hypoxia (HCC) 10/21/2019   Adjustment disorder with depressed mood 03/26/2017   AKI (acute kidney injury) (HCC)    Dialysis t-th-s   Anemia 10/21/2019   Anemia of chronic disease 2002   Anginal pain (HCC)    Anxiety    Panic attacks   Bilateral lower extremity edema  05/13/2016   Bipolar disorder (HCC)    Cataract    B/L cataract   Cellulitis 05/21/2014   right eye   CHF (congestive heart failure) (HCC)    Chronic bronchitis (HCC)    "get it q yr" (05/13/2013)   Chronic pain    Chronic pain of right knee 09/08/2016   Crutches as ambulation aid    uses forearm crutchs, Left  AKA with prosthetic.   Dehiscence of amputation stump (HCC)    Depression    Edema of lower extremity    Endocarditis 2002   subacute bacterial endocarditis.    Fall    Family history of anesthesia complication    "my mom has a hard time coming out from under"   Fibromyalgia    GERD (gastroesophageal reflux disease)    occ   Heart murmur    never has had any problems   Herpes simplex type 1 infection 01/16/2018   History of blood transfusion    "just low blood count" (05/13/2013)   History of sexually transmitted infection    Hyperlipidemia    Hypertension    Hypothyroidism    Hypothyroidism, adult 03/21/2014   Hypoxia    Leukocytosis    Muscle spasm 12/16/2020   Necrosis (HCC)    and ulceration   Necrotizing fasciitis s/p OR debridements 07/06/2012   Obesity    OSA on CPAP    does not use CPAP  Peripheral neuropathy    Pneumonia    Pyelonephritis 12/31/2020   Routine adult health maintenance 12/08/2016   Severe protein-calorie malnutrition (HCC)    Status post below knee amputation, left (HCC) 04/11/2017   Type II diabetes mellitus (HCC)    Type  II   Vaginal irritation 03/14/2016   Weak pulse 07/21/2021   Wound dehiscence 09/04/2019   Past Surgical History:  Procedure Laterality Date   A/V FISTULAGRAM Left 05/12/2021   Procedure: A/V FISTULAGRAM;  Surgeon: Victorino Sparrow, MD;  Location: [facility] INVASIVE CV LAB;  Service: Cardiovascular;  Laterality: Left;   ABDOMINAL AORTOGRAM W/LOWER EXTREMITY Left 03/19/2019   Procedure: ABDOMINAL AORTOGRAM W/LOWER EXTREMITY;  Surgeon: Nada Libman, MD;  Location: [facility] INVASIVE CV LAB;  Service: Cardiovascular;   Laterality: Left;   ABOVE KNEE LEG AMPUTATION Left 10/11/2019   wound dehisence    AIR/FLUID EXCHANGE Right 12/31/2019   Procedure: AIR/FLUID EXCHANGE;  Surgeon: Carmela Rima, MD;  Location: [facility] OR;  Service: Ophthalmology;  Laterality: Right;   AMPUTATION Left 04/07/2017   Procedure: LEFT BELOW KNEE AMPUTATION;  Surgeon: Nadara Mustard, MD;  Location: [facility] OR;  Service: Orthopedics;  Laterality: Left;   AMPUTATION Left 10/11/2019   Procedure: LEFT ABOVE KNEE AMPUTATION;  Surgeon: Nadara Mustard, MD;  Location: [facility] OR;  Service: Orthopedics;  Laterality: Left;   AV FISTULA PLACEMENT Left 01/05/2021   Procedure: ARTERIOVENOUS (AV) FISTULA CREATION vs. GRAFT;  Surgeon: Chuck Hint, MD;  Location: [facility] OR;  Service: Vascular;  Laterality: Left;   CATARACT EXTRACTION W/PHACO Right 02/11/2020   Procedure: CATARACT EXTRACTION PHACO AND INTRAOCULAR LENS PLACEMENT (IOC);  Surgeon: Carmela Rima, MD;  Location: [facility] OR;  Service: Ophthalmology;  Laterality: Right;   EYE SURGERY     lazer   FISTULA SUPERFICIALIZATION Left 02/23/2021   Procedure: SUPERFICIALIZATION OF LEFT BRACHIOCEPHALIC FISTULA;  Surgeon: Chuck Hint, MD;  Location: [facility] OR;  Service: Vascular;  Laterality: Left;   GAS INSERTION Right 12/31/2019   Procedure: INSERTION OF GAS;  Surgeon: Carmela Rima, MD;  Location: [facility] OR;  Service: Ophthalmology;  Laterality: Right;   INCISION AND DRAINAGE ABSCESS     multiple I&Ds   INCISION AND DRAINAGE ABSCESS Left 07/09/2012   Procedure: DRESSING CHANGE, THIGH WOUND;  Surgeon: Shelly Rubenstein, MD;  Location: [facility] OR;  Service: General;  Laterality: Left;   INCISION AND DRAINAGE OF WOUND Left 07/07/2012   Procedure: IRRIGATION AND DEBRIDEMENT WOUND;  Surgeon: Shelly Rubenstein, MD;  Location: [facility] OR;  Service: General;  Laterality: Left;   INCISION AND DRAINAGE PERIRECTAL ABSCESS Left 07/14/2012   Procedure: DEBRIDEMENT OF SKIN & SOFT TISSUE; DRESSING CHANGE UNDER ANESTHESIA;   Surgeon: Atilano Ina, MD,FACS;  Location: [facility] OR;  Service: General;  Laterality: Left;   INCISION AND DRAINAGE PERIRECTAL ABSCESS Left 07/16/2012   Procedure: I&D Left Thigh;  Surgeon: Cherylynn Ridges, MD;  Location: [facility] OR;  Service: General;  Laterality: Left;   INCISION AND DRAINAGE PERIRECTAL ABSCESS N/A 01/05/2015   Procedure: IRRIGATION AND DEBRIDEMENT PERIRECTAL ABSCESS;  Surgeon: Manus Rudd, MD;  Location: [facility] OR;  Service: General;  Laterality: N/A;   INSERTION OF DIALYSIS CATHETER Right 01/05/2021   Procedure: INSERTION OF DIALYSIS CATHETER;  Surgeon: Chuck Hint, MD;  Location: [facility] OR;  Service: Vascular;  Laterality: Right;   IRRIGATION AND DEBRIDEMENT ABSCESS Left 07/06/2012   Procedure: IRRIGATION AND DEBRIDEMENT ABSCESS BUTTOCKS AND THIGH;  Surgeon: Kandis Cocking, MD;  Location: [facility] OR;  Service: General;  Laterality: Left;   IRRIGATION AND DEBRIDEMENT ABSCESS Left 08/10/2012   Procedure: IRRIGATION AND DEBRIDEMENT ABSCESS;  Surgeon: Lodema Pilot, DO;  Location: [facility] OR;  Service: General;  Laterality: Left;   MEMBRANE PEEL Left 07/24/2022   Procedure: MEMBRANE PEEL;  Surgeon: Carmela Rima, MD;  Location: [facility] OR;  Service: Ophthalmology;  Laterality: Left;   PARS PLANA VITRECTOMY Right 12/31/2019   Procedure: PARS PLANA VITRECTOMY WITH 25 GAUGE;  Surgeon: Carmela Rima, MD;  Location: [facility] OR;  Service: Ophthalmology;  Laterality: Right;   PARS PLANA VITRECTOMY Left 07/24/2022   Procedure: PARS PLANA VITRECTOMY WITH 25 GAUGE;  Surgeon: Carmela Rima, MD;  Location: [facility] OR;  Service: Ophthalmology;  Laterality: Left;   PERIPHERAL VASCULAR INTERVENTION Left 05/12/2021   Procedure: PERIPHERAL VASCULAR INTERVENTION;  Surgeon: Victorino Sparrow, MD;  Location: [facility] INVASIVE CV LAB;  Service: Cardiovascular;  Laterality: Left;  arm fistula   PHOTOCOAGULATION Left 07/24/2022   Procedure: AIRGAS, SILICONE OIL, PHOTOCOAGULATION;  Surgeon: Carmela Rima, MD;  Location: [facility] OR;  Service:  Ophthalmology;  Laterality: Left;   PHOTOCOAGULATION WITH LASER Right 12/31/2019   Procedure: PHOTOCOAGULATION WITH LASER;  Surgeon: Carmela Rima, MD;  Location: [facility] OR;  Service: Ophthalmology;  Laterality: Right;   REPAIR OF COMPLEX TRACTION RETINAL DETACHMENT Right 12/31/2019   Procedure: REPAIR OF COMPLEX TRACTION RETINAL;  Surgeon: Carmela Rima, MD;  Location: [facility] OR;  Service: Ophthalmology;  Laterality: Right;   REPAIR OF COMPLEX TRACTION RETINAL DETACHMENT Left 07/24/2022   Procedure: REPAIR OF COMPLEX TRACTION RETINAL DETACHMENT;  Surgeon: Carmela Rima, MD;  Location: [facility] OR;  Service: Ophthalmology;  Laterality: Left;   STUMP REVISION Left 06/09/2017   Procedure: REVISION LEFT BELOW KNEE AMPUTATION;  Surgeon: Nadara Mustard, MD;  Location: [facility] OR;  Service: Orthopedics;  Laterality: Left;   STUMP REVISION Left 09/04/2019   Procedure: LEFT BELOW KNEE AMPUTATION REVISION;  Surgeon: Nadara Mustard, MD;  Location: [facility] OR;  Service: Orthopedics;  Laterality: Left;   STUMP REVISION Left 09/20/2019   Procedure: LEFT BELOW KNEE AMPUTATION REVISION;  Surgeon: Nadara Mustard, MD;  Location: [facility] OR;  Service: Orthopedics;  Laterality: Left;   TONSILLECTOMY  1994     A IV Location/Drains/Wounds Patient Lines/Drains/Airways Status     Active Line/Drains/Airways     Name Placement date Placement time Site Days   Peripheral IV 10/30/22 20 G 1.88" Right Antecubital 10/30/22  1922  Antecubital  less than 1   Fistula / Graft Left Upper arm Arteriovenous fistula 01/05/21  0911  Upper arm  663   Hemodialysis Catheter Right Internal jugular Double lumen Permanent (Tunneled) 01/05/21  0836  Internal jugular  663            Intake/Output Last 24 hours No intake or output data in the 24 hours ending 10/30/22 2133  Labs/Imaging Results for orders placed or performed during the hospital encounter of 10/30/22 (from the past 48 hour(s))  Urinalysis, Routine w reflex microscopic -Urine, Clean  Catch     Status: Abnormal   Collection Time: 10/30/22 11:48 AM  Result Value Ref Range   Color, Urine YELLOW YELLOW   APPearance CLOUDY (A) CLEAR   Specific Gravity, Urine 1.009 1.005 - 1.030   pH 9.0 (H) 5.0 - 8.0   Glucose, UA 150 (A) NEGATIVE mg/dL   Hgb urine dipstick NEGATIVE NEGATIVE   Bilirubin Urine NEGATIVE NEGATIVE   Ketones, ur 5 (A) NEGATIVE mg/dL   Protein, ur 161 (A) NEGATIVE mg/dL   Nitrite NEGATIVE NEGATIVE  Leukocytes,Ua NEGATIVE NEGATIVE   RBC / HPF 0-5 0 - 5 RBC/hpf   WBC, UA 0-5 0 - 5 WBC/hpf   Bacteria, UA RARE (A) NONE SEEN   Squamous Epithelial / HPF 21-50 0 - 5 /HPF    Comment: Performed at [facility], [address], [place], [place] [address]  Comprehensive metabolic panel     Status: Abnormal   Collection Time: 10/30/22 12:04 PM  Result Value Ref Range   Sodium 135 135 - 145 mmol/L   Potassium 4.1 3.5 - 5.1 mmol/L   Chloride 95 (L) 98 - 111 mmol/L   CO2 25 22 - 32 mmol/L   Glucose, Bld 191 (H) 70 - 99 mg/dL    Comment: Glucose reference range applies only to samples taken after fasting for at least 8 hours.   BUN 23 (H) 6 - 20 mg/dL   Creatinine, Ser 1.91 (H) 0.44 - 1.00 mg/dL   Calcium 47.8 8.9 - 29.5 mg/dL   Total Protein 8.7 (H) 6.5 - 8.1 g/dL   Albumin 4.1 3.5 - 5.0 g/dL   AST 21 15 - 41 U/L   ALT 18 0 - 44 U/L   Alkaline Phosphatase 150 (H) 38 - 126 U/L   Total Bilirubin 0.9 0.3 - 1.2 mg/dL   GFR, Estimated 8 (L) >60 mL/min    Comment: (NOTE) Calculated using the CKD-EPI Creatinine Equation (2021)    Anion gap 15 5 - 15    Comment: Performed at [facility], [address], [place], [place] [address]  Lipase, blood     Status: Abnormal   Collection Time: 10/30/22 12:04 PM  Result Value Ref Range   Lipase 132 (H) 11 - 51 U/L    Comment: Performed at [facility], [address] [place], [place] [address]  CBC with Diff     Status: None   Collection Time: 10/30/22 12:04 PM  Result Value Ref Range   WBC 6.4 4.0 -  10.5 K/uL   RBC 4.60 3.87 - 5.11 MIL/uL   Hemoglobin 13.1 12.0 - 15.0 g/dL   HCT 46.9 62.9 - 52.8 %   MCV 88.9 80.0 - 100.0 fL   MCH 28.5 26.0 - 34.0 pg   MCHC 32.0 30.0 - 36.0 g/dL   RDW 41.3 24.4 - 01.0 %   Platelets 166 150 - 400 K/uL   nRBC 0.0 0.0 - 0.2 %   Neutrophils Relative % 72 %   Neutro Abs 4.7 1.7 - 7.7 K/uL   Lymphocytes Relative 19 %   Lymphs Abs 1.2 0.7 - 4.0 K/uL   Monocytes Relative 6 %   Monocytes Absolute 0.4 0.1 - 1.0 K/uL   Eosinophils Relative 1 %   Eosinophils Absolute 0.0 0.0 - 0.5 K/uL   Basophils Relative 1 %   Basophils Absolute 0.0 0.0 - 0.1 K/uL   Immature Granulocytes 1 %   Abs Immature Granulocytes 0.04 0.00 - 0.07 K/uL    Comment: Performed at [facility], [address] [place], [place] [address]  Troponin I (High Sensitivity)     Status: None   Collection Time: 10/30/22 12:04 PM  Result Value Ref Range   Troponin I (High Sensitivity) 8 <18 ng/L    Comment: (NOTE) Elevated high sensitivity troponin I (hsTnI) values and significant  changes across serial measurements may suggest ACS but many other  chronic and acute conditions are known to elevate hsTnI results.  Refer to the "Links"  section for chest pain algorithms and additional  guidance. Performed at [facility], [address] [place], [place] [address]   I-Stat beta hCG blood, ED     Status: None   Collection Time: 10/30/22 12:19 PM  Result Value Ref Range   I-stat hCG, quantitative <5.0 <5 mIU/mL   Comment 3            Comment:   GEST. AGE      CONC.  (mIU/mL)   <=1 WEEK        5 - 50     2 WEEKS       50 - 500     3 WEEKS       100 - 10,000     4 WEEKS     1,000 - 30,000        FEMALE AND NON-PREGNANT FEMALE:     LESS THAN 5 mIU/mL   Rapid urine drug screen (hospital performed)     Status: Abnormal   Collection Time: 10/30/22  2:25 PM  Result Value Ref Range   Opiates NONE DETECTED NONE DETECTED   Cocaine NONE DETECTED NONE DETECTED   Benzodiazepines NONE  DETECTED NONE DETECTED   Amphetamines NONE DETECTED NONE DETECTED   Tetrahydrocannabinol POSITIVE (A) NONE DETECTED   Barbiturates NONE DETECTED NONE DETECTED    Comment: (NOTE) DRUG SCREEN FOR MEDICAL PURPOSES ONLY.  IF CONFIRMATION IS NEEDED FOR ANY PURPOSE, NOTIFY LAB WITHIN 5 DAYS.  LOWEST DETECTABLE LIMITS FOR URINE DRUG SCREEN Drug Class                     Cutoff (ng/mL) Amphetamine and metabolites    1000 Barbiturate and metabolites    200 Benzodiazepine                 200 Opiates and metabolites        300 Cocaine and metabolites        300 THC                            50 Performed at [facility], [address] [place], [place] [address]   Troponin I (High Sensitivity)     Status: Abnormal   Collection Time: 10/30/22  4:07 PM  Result Value Ref Range   Troponin I (High Sensitivity) 67 (H) <18 ng/L    Comment: RESULT CALLED TO, READ BACK BY AND VERIFIED WITH S,ROW[place] RN @1901  10/30/22 E,[place] DELTA CHECK NOTED (NOTE) Elevated high sensitivity troponin I (hsTnI) values and significant  changes across serial measurements may suggest ACS but many other  chronic and acute conditions are known to elevate hsTnI results.  Refer to the "Links" section for chest pain algorithms and additional  guidance. Performed at [facility], [address], [place], [place] [address]    *Note: Due to a large number of results and/or encounters for the requested time period, some results have not been displayed. A complete set of results can be found in Results Review.   CT ABDOMEN PELVIS WO CONTRAST  Result Date: 10/30/2022 CLINICAL DATA:  Abdominal pain. EXAM: CT ABDOMEN AND PELVIS WITHOUT CONTRAST TECHNIQUE: Multidetector CT imaging of the abdomen and pelvis was performed following the standard protocol without IV contrast. RADIATION DOSE REDUCTION: This exam was performed according to the departmental dose-optimization program which includes automated exposure control,  adjustment of the mA and/or kV according to patient  size and/or use of iterative reconstruction technique. COMPARISON:  December 31, 2020 FINDINGS: Lower chest: Moderate-severity atelectatic changes are seen within the bilateral lung bases. Hepatobiliary: No focal liver abnormality is seen. Numerous tiny gallstones are seen within the lumen of a mildly distended gallbladder. There is no evidence of gallbladder wall thickening, pericholecystic inflammation or biliary dilatation. Pancreas: Unremarkable. No pancreatic ductal dilatation or surrounding inflammatory changes. Spleen: Normal in size without focal abnormality. Adrenals/Urinary Tract: Adrenal glands are unremarkable. The kidneys are slightly small in size, without renal calculi or hydronephrosis. A 1.8 cm cyst is seen within the mid right kidney. A 7 mm lipoma is seen within the posterior aspect of the mid left kidney. Bladder is unremarkable. Stomach/Bowel: Stomach is within normal limits. Appendix appears normal. No evidence of bowel wall thickening, distention, or inflammatory changes. Small, noninflamed diverticula are seen within the mid to distal sigmoid colon. Vascular/Lymphatic: Aortic atherosclerosis. No enlarged abdominal or pelvic lymph nodes. Reproductive: Uterus and bilateral adnexa are unremarkable. Other: There is a small, fat containing umbilical hernia. No abdominopelvic ascites. Musculoskeletal: No acute or significant osseous findings. IMPRESSION: 1. Cholelithiasis without evidence of acute cholecystitis. 2. Sigmoid diverticulosis. 3. Small, fat containing umbilical hernia. 4. Small right renal cyst and left renal lipoma. No follow-up imaging is recommended. This recommendation follows ACR consensus guidelines: Management of the Incidental Renal Mass on CT: A White Paper of the ACR Incidental Findings Committee. J Am Coll Radiol [phone]. 5. Moderate severity bibasilar atelectasis. 6. Aortic atherosclerosis. Aortic Atherosclerosis  (ICD10-I70.0). Electronically Signed   By: Aram Candela M.D.   On: 10/30/2022 18:23   DG Chest 2 View  Result Date: 10/30/2022 CLINICAL DATA:  Upper abdominal pain. EXAM: CHEST - 2 VIEW COMPARISON:  01/05/2021 FINDINGS: Stable scarring right mid lung. No focal pneumonia or substantial pleural effusion. Mild vascular congestion evident. The cardiopericardial silhouette is within normal limits for size. Vascular stent device overlies the left subclavian region. IMPRESSION: Vascular congestion. Component of underlying mild interstitial edema not excluded. Electronically Signed   By: Kennith Center M.D.   On: 10/30/2022 12:59    Pending Labs Unresulted Labs (From admission, onward)    None       Vitals/Pain Today's Vitals   10/30/22 2028 10/30/22 2029 10/30/22 2030 10/30/22 2045  BP:   (!) 189/127 (!) 192/157  Pulse: (!) 101 (!) 101 (!) 102 (!) 104  Resp: (!) 8 11 (!) 7 17  Temp:      TempSrc:      SpO2: 100% 100% 100% 100%  Weight:      Height:      PainSc:        Isolation Precautions No active isolations  Medications Medications  aspirin tablet 325 mg (has no administration in time range)  heparin injection 5,000 Units (has no administration in time range)  acetaminophen (TYLENOL) tablet 1,000 mg (has no administration in time range)  HYDROmorphone (DILAUDID) injection 1 mg (has no administration in time range)  oxyCODONE (Oxy IR/ROXICODONE) immediate release tablet 5 mg (has no administration in time range)  amLODipine (NORVASC) tablet 10 mg (has no administration in time range)  metoprolol succinate (TOPROL-XL) 24 hr tablet 25 mg (has no administration in time range)  ondansetron (ZOFRAN) 8 mg in sodium chloride 0.9 % 50 mL IVPB (has no administration in time range)  atorvastatin (LIPITOR) tablet 20 mg (has no administration in time range)  levothyroxine (SYNTHROID) tablet 88 mcg (has no administration in time range)  ondansetron (ZOFRAN-ODT) disintegrating tablet 4  mg  (4 mg Oral Given 10/30/22 1520)  oxyCODONE-acetaminophen (PERCOCET/ROXICET) 5-325 MG per tablet 1 tablet (1 tablet Oral Given 10/30/22 1519)  HYDROmorphone (DILAUDID) injection 0.5 mg (0.5 mg Intravenous Given 10/30/22 1714)  ondansetron (ZOFRAN) injection 4 mg (4 mg Intravenous Given 10/30/22 1716)  pantoprazole (PROTONIX) injection 40 mg (40 mg Intravenous Given 10/30/22 1716)  diazepam (VALIUM) injection 2.5 mg (2.5 mg Intravenous Given 10/30/22 1927)  alum & mag hydroxide-simeth (MAALOX/MYLANTA) 200-200-20 MG/5ML suspension 30 mL (30 mLs Oral Given 10/30/22 1928)  famotidine (PEPCID) tablet 20 mg (20 mg Oral Given 10/30/22 1928)  labetalol (NORMODYNE) injection 20 mg (20 mg Intravenous Given 10/30/22 1928)  metoCLOPramide (REGLAN) injection 10 mg (10 mg Intravenous Given 10/30/22 2101)  HYDROmorphone (DILAUDID) injection 0.5 mg (0.5 mg Intravenous Given 10/30/22 2100)    Mobility walks with person assist     Focused Assessments Pt coming in for many different chronic medical conditions that are not controlled well, see ED providers note    R Recommendations: See Admitting Provider Note  Report given to:   Additional Notes: Call or epic message for any additional questions

## 2022-10-30 NOTE — ED Provider Triage Note (Signed)
Emergency Medicine Provider Triage Evaluation Note  Jona Erkkila , a 47 y.o. female  was evaluated in triage.  Pt complains of epigastric abdominal pain. She states that same began approximately 20 minutes prior to arrival with associated nausea/vomiting. Pain is upper abdomen/lower chest and does not radiate. Denies history of similar symptoms previously. Took a zofran with no improvement. Last dialysis yesterday. No history of abdominal surgeries. No shortness of breath  Review of Systems  Positive:  Negative:   Physical Exam  BP (!) 199/116   Pulse 86   Resp 20   Ht 5\' 8"  (1.727 m)   Wt 131.1 kg   SpO2 96%   BMI 43.94 kg/m  Gen:   Awake, no distress   Resp:  Normal effort  MSK:   Moves extremities without difficulty  Other:  No focal abdominal tenderness to palpation  Medical Decision Making  Medically screening exam initiated at 11:53 AM.  Appropriate orders placed.  Cori Justus was informed that the remainder of the evaluation will be completed by another provider, this initial triage assessment does not replace that evaluation, and the importance of remaining in the ED until their evaluation is complete.     Silva Bandy, PA-C 10/30/22 1156

## 2022-10-30 NOTE — H&P (Cosign Needed)
Date: 10/30/2022         Patient Name:  [name] [name] MRN: [id number]  DOB: 1976-02-07 Age / Sex: 47 y.o., female   PCP: Belva Agee, MD         Medical Service: Internal Medicine Teaching Service         Attending Physician: Dr. Inez Catalina, MD    First Contact: Dr. Geraldo Pitter Pager: [phone]  Second Contact: Dr. Marijo Conception Pager: [phone]       After Hours (After 5p/  First Contact Pager: [phone]  weekends / holidays): Second Contact Pager: [phone]   Chief Concern: Chest pain, abdominal pain, nausea and vomiting  History of Present Illness: 47 year old female presents to ED because of chest pain, abdominal pain, nausea and vomiting.  Chest pain started somewhat suddenly around 10:30 AM when she was talking on the phone.  It was preceded by sensation of spreading warmness all over her body, associated with some diaphoresis.  Since then the pain has been constant and unremitting.  Feels like a painful squeezing.  Does not seem to get worse with exertion.  She took Tylenol without any effect.  Pain is nonradiating.  She has never had pain like this before.  Abdominal pain started around the same time as her chest pain.  It is described as pain like she needs to have a bowel movement.  She attempted once today and had to strain a lot, did not have diarrhea.  She has had nausea with several episodes of vomiting, no food in her stomach so it is just watery emesis.  Last meal was around 7 PM last night.  In the ED, she is noted to be pretty severely hypertensive.  EKG was routine.  Serial troponins checked.  Supportive therapy for nausea and vomiting and pain.  Review of Systems  Constitutional:  Negative for fever.  Eyes:  Negative for blurred vision.  Respiratory:  Negative for shortness of breath.   Cardiovascular:  Positive for palpitations. Negative for leg swelling.  Gastrointestinal:  Negative for diarrhea.  Neurological:  Negative for headaches.     Allergies: Allergies  Allergen Reactions   Vancomycin Other (See Comments)    Acute renal failure suspected secondary to vanco   Past Medical History: Medical history reviewed.  Notable for ESRD on dialysis, HFpEF, morbid obesity, type 2 diabetes, bipolar disorder, obstructive sleep apnea, hypertension.  Medications: Current Outpatient Medications  Medication Instructions   allopurinol (ZYLOPRIM) 100 MG tablet Take 1 tablet by mouth 3 times a week. Take only after dialysis sessions. Do not take on non-dialysis days.   amitriptyline (ELAVIL) 100 mg, Oral, Daily at bedtime   amLODipine (NORVASC) 10 mg, Oral, Daily   atorvastatin (LIPITOR) 20 mg, Oral, Daily   Benzoyl Peroxide 2.5 % CREA 1 drop, Apply externally, Daily   bismuth subsalicylate (PEPTO BISMOL) 262 MG/15ML suspension 30 mLs, Oral, Every 6 hours PRN   blood glucose meter kit and supplies KIT ICD 10- E11.69. Based on patient and insurance preference. Use up to four times daily as directed.   buPROPion (WELLBUTRIN) 100 mg, Oral, Every 48 hours   calcium acetate (PHOSLO) 667 mg, Oral, 3 times daily with meals   cyclobenzaprine (FLEXERIL) 5 MG tablet Take 1 tablet by mouth daily as needed for muscle spasms.   Dialyvite 800 0.8 mg, Daily   diclofenac Sodium (VOLTAREN) 1 % GEL APPLY 1 GRAM TOPICALLY TO AFFECTED AREA 4 TIMES DAILY AS NEEDED   fluconazole (DIFLUCAN) 150 MG tablet  Take 1 pill on day 1 and another pill 3 days later. TAKE AFTER DIALYSIS   fluticasone (FLONASE) 50 MCG/ACT nasal spray 1 spray, Each Nare, Daily   folic acid-vitamin b complex-vitamin c-selenium-zinc (DIALYVITE) 3 MG TABS tablet 1 tablet, Oral, Daily   furosemide (LASIX) 20 MG tablet Take 1 tablet by mouth daily. Take on non-dialysis days. Adjust per kidney docs   hydrocortisone 2.5 % lotion Apply to affected area 2 times daily for 2-4 weeks   hydrOXYzine (ATARAX) 25 MG tablet TAKE 1/2 TABLET BY MOUTH EVERY 6 HOURS AS NEEDED FOR ANXIETY   Insulin Pen  Needle (TRUEPLUS PEN NEEDLES) 31G X 5 MM MISC 1 each, Other, 3 times daily   ketoconazole (NIZORAL) 2 % cream Apply 1 application topically to affected area daily.   levothyroxine (SYNTHROID) 88 mcg, Oral, Daily   metoprolol succinate (TOPROL-XL) 25 mg, Oral, Daily   Mounjaro 5 mg, Subcutaneous, Weekly, Takes on Saturday.   mupirocin ointment (BACTROBAN) 2 % APPLY TO AFFECTED AREA(S) DAILY AS NEEDED   ofloxacin (OCUFLOX) 0.3 % ophthalmic solution 1 drop, Left Eye, 2 times daily   ondansetron (ZOFRAN) 8 mg, Oral, Every 8 hours PRN   oxyCODONE-acetaminophen (PERCOCET) 7.5-325 MG tablet 1 tablet, Oral, Every 8 hours PRN   sevelamer carbonate (RENVELA) 1,600 mg, Oral, 3 times daily with meals   tretinoin (RETIN-A) 0.025 % cream Topical, Daily at bedtime   Fill history suggests poor adherence to medications, and this person acknowledges not having taken her antihypertensives in quite some time.  Surgical History: Reviewed, no recent surgeries.  No history of abdominal surgeries as far she knows.  Family History:  Reviewed, notable for death due to heart attack in mother and father, mother was in late 69s, father was in 68s.  Social History:  Lives at home in [place] with her goddaughter.  When she is feeling well, she enjoys playing games on her phone.  She enjoys high functional status doing things like shopping, gets out of the house to spend time with family, etc.  She no longer drinks alcohol.  She is a former smoker.  She smokes marijuana occasionally, last use was approximately 1 month ago.  Physical Exam: Blood pressure (!) 186/118, pulse (!) 106, temperature 98.1 F (36.7 C), temperature source Oral, resp. rate 16, height 5\' 8"  (1.727 m), weight 131.1 kg, SpO2 100 %.  Uncomfortable appearing female sitting upright in hospital stretcher Oral mucous membranes are dry Heart rate is tachycardic, rhythm is regular, no appreciable murmurs, right radial and right dorsalis pedis pulses are  strong, left brachiocephalic AV fistula with palpable thrill Breathing is regular and unlabored on room air, lungs without wheezes or crackles Abdomen soft and diffusely tender without rebound tenderness or guarding Skin is warm and dry Alert and oriented, normal extraocular movements, follows instructions well, tongue midline, symmetric palatal elevation, symmetric face, equal bilateral grip strength Distraught, concordant affect  EKG:  Sinus rhythm, somewhat prolonged QT interval, no other repolarization abnormalities, unknown changed since last tracing  Labs: Sodium 135 Potassium 4.1 Chloride 95 CO2 25 BUN/creatinine 23/6.23 Glucose 191  WBC 6.4 Hemoglobin 13.1  Alk phos 150 AST/ALT 21/18 Lipase 132  Troponin 8 => 67  Images and other studies: CT abdomen pelvis without acute findings, incidental small gallstones, no pancreatic or peripancreatic inflammation, no signs of obstruction, normal-appearing appendix, atherosclerosis  Chest x-ray with some vascular congestion but no focal opacity, effusion, or frank edema  Assessment & Plan:  Shantelle Alles is a 47 y.o.  with ESRD, type 2 diabetes, and atherosclerotic cardiovascular disease who presents with chest pain, abdominal pain, nausea, and vomiting, admitted for rule out of ACS.  Principal Problem:   Chest pain  Chest pain Fairly atypical for acute coronary syndrome without the exertional component.  Her EKG is reassuring against STEMI.  Overall suspicion for ACS is low but prudent to admit for rule out in this person with several risk factors.  Do not think this is an aortic dissection as her pain is atypical for dissection.  Do not think this is PE without much in the way of respiratory concerns.  Will continue trending troponins given the acute rise observed in the emergency department, although in setting of hypertension and tachycardia I do not necessarily think this reflects ACS.  Loaded with aspirin. - Admit to  cardiac telemetry unit - Status post aspirin 324 mg - Follow-up troponin  Update 2:26 AM Troponin 6>60>450. T-wave amplitude increasing but still no ST segment elevation or depression. Reassessed, her pain is stable. She looks more comfortable than earlier. This acute rise in troponin makes me worry about NSTEMI. Will start heparin infusion. Also starting titratable nitroglycerine infusion for chest pain and hypertension. Cardiology paged, they will see the patient tomorrow.  Abdominal pain, nausea, and vomiting Wonder about a gastroenteritis.  She describes what sounds to me like a bit of a vagal episode prior to onset of her symptoms, associated with a sensation like she has to have a bowel movement.  Lipase is only mildly elevated and there is no pancreatic or peripancreatic inflammation on the CT.  She uses marijuana, but not regularly so hyperemesis syndrome is less likely.  Gallstones were incidental findings and there was not anything to suggest cholecystitis on the CT, but alk phos was up, so we will follow-up this up with a GGT, can consider right upper quadrant ultrasound as next step.  Continue supportive therapy for now, caution with antiemetics as QT is somewhat prolonged. - Follow-up GGT - Diazepam 2.5 mg IV as needed for vomiting, this worked well in the emergency department  Severe hypertension Nonadherent to medications at home.  Suspect that this is more chronic rather than acute.  No headache or vision changes.  Do not think that this is driving her clinical syndrome currently.  Status post IV labetalol in the ED.  Will manage conservatively for now and restart home antihypertensives. - Amlodipine 10 mg daily - Metoprolol succinate 25 mg daily  End-stage renal disease on dialysis Last dialysis session was Saturday, 10/29/2022.  Has left brachiocephalic AV fistula for access.  Electrolytes are okay today.  Volume status okay.  No urgent need, can talk to nephrology tomorrow morning  about getting her on the schedule.  Type 2 diabetes On Mounjaro at home.  Glucose 191 on admission.  Will start low-dose of insulin given relative lack of p.o. intake last 24 hours. - Glargine 5 units nightly - Sensitive SSI  Obstructive sleep apnea - CPAP nightly  Diet: Regular IVF: None VTE: heparin injection 5,000 Units Start: 10/30/22 2200 Code: Full Surrogate: Sister Alfredo Martinez, [phone] (patient prefers limited status updates, rather than detailed medical information shared with her sister)  Admit patient to Observation with expected length of stay less than 2 midnights.  Signed: Marrianne Mood MD 10/30/2022, 10:17 PM

## 2022-10-30 NOTE — Discharge Instructions (Signed)
[name] [name], It was a pleasure taking care of you while hospitalized. You were admitted for chest pain caused by high blood pressure. The ultrasound and imaging of your heart showed mild blockages in your arteries. You were started on two new medications for your heart called aspirin and atorvastatin. You will also need to continue your home amlodipine and metoprolol to control your blood pressure. Please take these at night. We have discontinued your amitriptyline and ondansetron due to changes present with your EKG. Stress/anxiety may also be playing a role in your chest pain. We have increased your hydroxyzine dose to 25 mg three times daily as needed. You were also found to have Vitamin D deficiency, so you will need supplementation for the next 9 weeks.  Medication changes: Start taking aspirin 81 mg once daily Start taking atorvastatin 80 mg once daily Start taking Vitamin D 50,000 units once weekly for 9 more weeks Stop taking amitriptyline and ondansetron due to side effects on your EKG. Increase to hydroxyzine 25 mg three times daily as needed for anxiety. Continue taking amlodipine 10 mg once nightly. Continue taking metoprolol succinate 25 mg once nightly. Hold if blood pressure is low.

## 2022-10-30 NOTE — Hospital Course (Addendum)
Subjective:  Overnight events: Patient was hypotensive overnight. Percocet was held, amlodipine and coreg discontinued. Blood pressure returned to 108/69 this morning.  Patient feeling decent this morning. She was eating breakfast without nausea or vomiting. Her chest pain and abdominal pain have significantly improved. She still has not had a bowel movement. She denies any lightheadedness, dizziness, or palpitations. She reports increased stress due to family issues over the past couple months and wonders if anxiety/panic attacks could be contributing to her chest pain. Discussed plan to monitor blood pressure and chest pain with plan to discharge if stable. All questions and concerns were addressed.  Physical Exam: Constitutional: Sitting comfortably on side hospital bed eating breakfast. No acute distress. Cardiovascular: Regular rate, regular rhythm. No murmurs, rubs, or gallops. Pulmonary: Normal respiratory effort on room air. No wheezes, rales, rhonchi, or crackles.   Abdominal: Soft. Non-distended. No tenderness to palpation. Hypoactive bowel sounds.  Musculoskeletal: Normal muscle bulk. No edema in RLE or left residual limb. Neurological: Alert and oriented to person, place, and time. Skin: Warm and dry. Hyperpigmented spots on upper extremities and upper back.   [name] [name] is a 47 year-old with ESRD, type 2 diabetes, and atherosclerotic cardiovascular disease who presented with chest pain, abdominal pain, nausea, and vomiting. Patient was admitted on 6/9 for rule out of ACS, later found to have NSTEMI.   # Chest pain # NSTEMI Patient presented on 6/9 with chest pain that was squeezing in nature and ranged from her sternum to epigastric region. The chest pain started while she was sitting and was only relieved by the medications she received in the ED. Initial troponin was 8. She received loading dose of aspirin 325 mg and admitted for rule out of ACS. Overnight, troponin trended  up from [phone]. Blood pressure ~200/100s. EKG demonstrated new T wave inversions in inferior leads. Patient was started on heparin drip, and cardiology was consulted. Patient underwent echocardiogram and LHC on 6/10. Echocardiogram with EF 60-65%, no regional wall motion abnormalities, and grade I diastolic dysfunction. LHC with mild stenosis of 20-30% throughout and left ventricular hypertrophy. Given the reassuring findings on echo and LHC, chest pain attributed to type 2 NSTEMI in the setting of severe hypertension. Patient continued to have intermittent episodes of chest pain on 6/11. Suspect anxiety/panic attacks are playing a role with these episodes since patient has recently been under more stress. Will discontinue amitriptyline at discharge due to prolonged QT. Will increase hydroxyzine to 25 mg TID PRN. Recommend outpatient follow-up for further medication adjustments for anxiety. Will continue aspirin 81 mg and atorvastatin 80 mg at discharge for NSTEMI.   # Abdominal pain/nausea/vomiting Patient reports abdominal pain and nausea occur simultaneously with the chest pain. CT abdomen pelvis without acute findings, incidental small gallstones, no pancreatic or peripancreatic inflammation, no signs of obstruction, normal-appearing appendix, atherosclerosis. Do not think any acute process is occurring. Symptoms likely secondary to chest pain. Patient may also be constipated since she has not had a recent bowel movement. Started bowel regimen.   # Severe hypertension Blood pressure labile throughout hospitalization, ranging from 65-200s systolic. She was hypertensive before dialysis and then hypotensive after dialysis. Nephrology recommending patient resume home regimen of amlodipine 10 mg daily and metoprolol succinate 25 mg daily at discharge. Patient will then need to check blood pressure after dialysis before taking her medications at bedtime, and hold metoprolol if BP remains low.  #  ESRD Patient on outpatient T/Th/S hemodialysis schedule. She received in hospital HD on 6/11.  Patient can resume outpatient schedule at discharge.   # Type 2 diabetes Blood glucose well-controlled throughout hospitalization. Patient received glargine 5 units nightly with sensitive SSI at meals. Patient can resume home Mounjaro at discharge.   # Vitamin D deficiency Vitamin D at 4.45 on admission. Patient received Vitamin D 50,000 units x 1. Will continue supplementation at discharge for 9 more weeks. Will need levels rechecked outpatient.  ----------------------------------------------------------------- 6/10 -No chest pain and no abd pain now -chest pain improved after meds  -No swelling in R leg or L stump  -On arrival: Chest pain was in middle of chest and abdomen (squeezing) and she felt weak; also had n/v (vomited at 3a) and diaphoresis, chills   06/12 Last BM has been since before admission. Chest pain is a lot better. Patient is less anxious today. Explained the plan to the patient and answered any questions. She is asking if any of this can be related to stress. Potentially get her out of here soon.

## 2022-10-30 NOTE — ED Triage Notes (Signed)
Per [facility] pt coming from home c/o abdominal pain onset of 15 mins prior to their arrival. Having nausea and vomiting. Left  BKA. Actively vomiting in triage. Had dialysis yesterday.

## 2022-10-31 ENCOUNTER — Inpatient Hospital Stay (HOSPITAL_COMMUNITY): Payer: 59

## 2022-10-31 ENCOUNTER — Inpatient Hospital Stay (HOSPITAL_COMMUNITY)
Admission: EM | Disposition: A | Discharge status: Home / Self Care | Payer: Self-pay | Source: Home / Self Care | Attending: Internal Medicine

## 2022-10-31 ENCOUNTER — Other Ambulatory Visit (HOSPITAL_COMMUNITY): Payer: 59

## 2022-10-31 DIAGNOSIS — Z7985 Long-term (current) use of injectable non-insulin antidiabetic drugs: Secondary | ICD-10-CM

## 2022-10-31 DIAGNOSIS — I214 Non-ST elevation (NSTEMI) myocardial infarction: Secondary | ICD-10-CM | POA: Diagnosis not present

## 2022-10-31 DIAGNOSIS — Z87891 Personal history of nicotine dependence: Secondary | ICD-10-CM

## 2022-10-31 DIAGNOSIS — I10 Essential (primary) hypertension: Secondary | ICD-10-CM

## 2022-10-31 DIAGNOSIS — I2510 Atherosclerotic heart disease of native coronary artery without angina pectoris: Secondary | ICD-10-CM | POA: Diagnosis present

## 2022-10-31 DIAGNOSIS — E1122 Type 2 diabetes mellitus with diabetic chronic kidney disease: Secondary | ICD-10-CM | POA: Diagnosis not present

## 2022-10-31 DIAGNOSIS — K429 Umbilical hernia without obstruction or gangrene: Secondary | ICD-10-CM | POA: Diagnosis present

## 2022-10-31 DIAGNOSIS — I120 Hypertensive chronic kidney disease with stage 5 chronic kidney disease or end stage renal disease: Secondary | ICD-10-CM | POA: Diagnosis not present

## 2022-10-31 DIAGNOSIS — R079 Chest pain, unspecified: Secondary | ICD-10-CM | POA: Diagnosis not present

## 2022-10-31 DIAGNOSIS — E785 Hyperlipidemia, unspecified: Secondary | ICD-10-CM | POA: Diagnosis present

## 2022-10-31 DIAGNOSIS — I953 Hypotension of hemodialysis: Secondary | ICD-10-CM | POA: Diagnosis not present

## 2022-10-31 DIAGNOSIS — I5032 Chronic diastolic (congestive) heart failure: Secondary | ICD-10-CM | POA: Diagnosis not present

## 2022-10-31 DIAGNOSIS — E559 Vitamin D deficiency, unspecified: Secondary | ICD-10-CM

## 2022-10-31 DIAGNOSIS — Z992 Dependence on renal dialysis: Secondary | ICD-10-CM | POA: Diagnosis not present

## 2022-10-31 DIAGNOSIS — I2489 Other forms of acute ischemic heart disease: Secondary | ICD-10-CM | POA: Diagnosis not present

## 2022-10-31 DIAGNOSIS — I152 Hypertension secondary to endocrine disorders: Secondary | ICD-10-CM | POA: Diagnosis not present

## 2022-10-31 DIAGNOSIS — N186 End stage renal disease: Secondary | ICD-10-CM | POA: Diagnosis not present

## 2022-10-31 DIAGNOSIS — R112 Nausea with vomiting, unspecified: Secondary | ICD-10-CM | POA: Diagnosis not present

## 2022-10-31 DIAGNOSIS — Z6841 Body Mass Index (BMI) 40.0 and over, adult: Secondary | ICD-10-CM | POA: Diagnosis not present

## 2022-10-31 DIAGNOSIS — G8929 Other chronic pain: Secondary | ICD-10-CM | POA: Diagnosis present

## 2022-10-31 DIAGNOSIS — M797 Fibromyalgia: Secondary | ICD-10-CM | POA: Diagnosis present

## 2022-10-31 DIAGNOSIS — R1013 Epigastric pain: Secondary | ICD-10-CM | POA: Diagnosis present

## 2022-10-31 DIAGNOSIS — E1169 Type 2 diabetes mellitus with other specified complication: Secondary | ICD-10-CM | POA: Diagnosis present

## 2022-10-31 DIAGNOSIS — N281 Cyst of kidney, acquired: Secondary | ICD-10-CM | POA: Diagnosis present

## 2022-10-31 DIAGNOSIS — D631 Anemia in chronic kidney disease: Secondary | ICD-10-CM | POA: Diagnosis not present

## 2022-10-31 DIAGNOSIS — F319 Bipolar disorder, unspecified: Secondary | ICD-10-CM | POA: Diagnosis present

## 2022-10-31 DIAGNOSIS — I21A1 Myocardial infarction type 2: Secondary | ICD-10-CM | POA: Diagnosis not present

## 2022-10-31 DIAGNOSIS — E039 Hypothyroidism, unspecified: Secondary | ICD-10-CM | POA: Diagnosis not present

## 2022-10-31 DIAGNOSIS — K8020 Calculus of gallbladder without cholecystitis without obstruction: Secondary | ICD-10-CM | POA: Diagnosis present

## 2022-10-31 DIAGNOSIS — Z89612 Acquired absence of left leg above knee: Secondary | ICD-10-CM | POA: Diagnosis not present

## 2022-10-31 DIAGNOSIS — Z89512 Acquired absence of left leg below knee: Secondary | ICD-10-CM | POA: Diagnosis not present

## 2022-10-31 DIAGNOSIS — E1140 Type 2 diabetes mellitus with diabetic neuropathy, unspecified: Secondary | ICD-10-CM | POA: Diagnosis present

## 2022-10-31 DIAGNOSIS — N250 Renal osteodystrophy: Secondary | ICD-10-CM | POA: Diagnosis not present

## 2022-10-31 DIAGNOSIS — I160 Hypertensive urgency: Secondary | ICD-10-CM | POA: Diagnosis not present

## 2022-10-31 HISTORY — PX: LEFT HEART CATH AND CORONARY ANGIOGRAPHY: CATH118249

## 2022-10-31 LAB — COMPREHENSIVE METABOLIC PANEL
ALT: 17 U/L (ref 0–44)
AST: 18 U/L (ref 15–41)
Albumin: 3.6 g/dL (ref 3.5–5.0)
Alkaline Phosphatase: 139 U/L — ABNORMAL HIGH (ref 38–126)
Anion gap: 15 (ref 5–15)
BUN: 32 mg/dL — ABNORMAL HIGH (ref 6–20)
CO2: 27 mmol/L (ref 22–32)
Calcium: 10 mg/dL (ref 8.9–10.3)
Chloride: 94 mmol/L — ABNORMAL LOW (ref 98–111)
Creatinine, Ser: 7.31 mg/dL — ABNORMAL HIGH (ref 0.44–1.00)
GFR, Estimated: 6 mL/min — ABNORMAL LOW (ref >60)
Glucose, Bld: 206 mg/dL — ABNORMAL HIGH (ref 70–99)
Potassium: 4.5 mmol/L (ref 3.5–5.1)
Sodium: 136 mmol/L (ref 135–145)
Total Bilirubin: 0.9 mg/dL (ref 0.3–1.2)
Total Protein: 7.9 g/dL (ref 6.5–8.1)

## 2022-10-31 LAB — LIPID PANEL
Cholesterol: 137 mg/dL (ref 0–200)
HDL: 50 mg/dL (ref >40)
LDL Cholesterol: 65 mg/dL (ref 0–99)
Total CHOL/HDL Ratio: 2.7 RATIO
Triglycerides: 111 mg/dL (ref <150)
VLDL: 22 mg/dL (ref 0–40)

## 2022-10-31 LAB — HEPATITIS B SURFACE ANTIGEN: Hepatitis B Surface Ag: NONREACTIVE

## 2022-10-31 LAB — TROPONIN I (HIGH SENSITIVITY)
Troponin I (High Sensitivity): 445 ng/L (ref <18)
Troponin I (High Sensitivity): 649 ng/L (ref <18)
Troponin I (High Sensitivity): 677 ng/L (ref <18)
Troponin I (High Sensitivity): 428 ng/L (ref <18)

## 2022-10-31 LAB — ECHOCARDIOGRAM COMPLETE
Area-P 1/2: 4.06 cm2
Height: 69 in
S' Lateral: 2.9 cm
Weight: 1828.16 oz

## 2022-10-31 LAB — PHOSPHORUS: Phosphorus: 4.8 mg/dL — ABNORMAL HIGH (ref 2.5–4.6)

## 2022-10-31 LAB — GLUCOSE, CAPILLARY
Glucose-Capillary: 89 mg/dL (ref 70–99)
Glucose-Capillary: 87 mg/dL (ref 70–99)
Glucose-Capillary: 137 mg/dL — ABNORMAL HIGH (ref 70–99)
Glucose-Capillary: 161 mg/dL — ABNORMAL HIGH (ref 70–99)

## 2022-10-31 LAB — HEPARIN LEVEL (UNFRACTIONATED): Heparin Unfractionated: 0.11 IU/mL — ABNORMAL LOW (ref 0.30–0.70)

## 2022-10-31 LAB — CBG MONITORING, ED
Glucose-Capillary: 187 mg/dL — ABNORMAL HIGH (ref 70–99)
Glucose-Capillary: 204 mg/dL — ABNORMAL HIGH (ref 70–99)

## 2022-10-31 LAB — VITAMIN D 25 HYDROXY (VIT D DEFICIENCY, FRACTURES): Vit D, 25-Hydroxy: 4.45 ng/mL — ABNORMAL LOW (ref 30–100)

## 2022-10-31 LAB — GAMMA GT: GGT: 28 U/L (ref 7–50)

## 2022-10-31 SURGERY — LEFT HEART CATH AND CORONARY ANGIOGRAPHY
Anesthesia: LOCAL

## 2022-10-31 MED ORDER — ONDANSETRON HCL 4 MG/2ML IJ SOLN
4.0000 mg | Freq: Four times a day (QID) | INTRAMUSCULAR | Status: DC | PRN
  Administered 2022-11-01: 4 mg via INTRAVENOUS
  Filled 2022-10-31: qty 2

## 2022-10-31 MED ORDER — VERAPAMIL HCL 2.5 MG/ML IV SOLN
INTRAVENOUS | Status: AC
  Filled 2022-10-31: qty 2

## 2022-10-31 MED ORDER — PERFLUTREN LIPID MICROSPHERE
1.0000 mL | INTRAVENOUS | Status: AC | PRN
  Administered 2022-10-31: 4 mL via INTRAVENOUS

## 2022-10-31 MED ORDER — HYDRALAZINE HCL 20 MG/ML IJ SOLN: 10.0000 mg | INTRAMUSCULAR | Status: AC | PRN

## 2022-10-31 MED ORDER — ATORVASTATIN CALCIUM 80 MG PO TABS
80.0000 mg | ORAL_TABLET | Freq: Every day | ORAL | Status: DC
  Administered 2022-10-31 – 2022-11-02 (×3): 80 mg via ORAL
  Filled 2022-10-31 (×3): qty 1

## 2022-10-31 MED ORDER — HEPARIN BOLUS VIA INFUSION
4000.0000 [IU] | Freq: Once | INTRAVENOUS | Status: DC
  Filled 2022-10-31: qty 4000

## 2022-10-31 MED ORDER — LIDOCAINE HCL (PF) 1 % IJ SOLN
INTRAMUSCULAR | Status: DC | PRN
  Administered 2022-10-31: 15 mL

## 2022-10-31 MED ORDER — VITAMIN D (ERGOCALCIFEROL) 1.25 MG (50000 UNIT) PO CAPS
50000.0000 [IU] | ORAL_CAPSULE | ORAL | Status: DC
  Administered 2022-10-31: 50000 [IU] via ORAL
  Filled 2022-10-31: qty 1

## 2022-10-31 MED ORDER — SODIUM CHLORIDE 0.9 % IV SOLN: INTRAVENOUS | Status: DC

## 2022-10-31 MED ORDER — SODIUM CHLORIDE 0.9 % IV SOLN: 250.0000 mL | INTRAVENOUS | Status: DC | PRN

## 2022-10-31 MED ORDER — HEPARIN BOLUS VIA INFUSION
2000.0000 [IU] | Freq: Once | INTRAVENOUS | Status: AC
  Administered 2022-10-31: 2000 [IU] via INTRAVENOUS
  Filled 2022-10-31: qty 2000

## 2022-10-31 MED ORDER — NITROGLYCERIN 0.4 MG SL SUBL
0.4000 mg | SUBLINGUAL_TABLET | SUBLINGUAL | Status: DC | PRN
  Administered 2022-10-31: 0.4 mg via SUBLINGUAL
  Filled 2022-10-31 (×4): qty 1

## 2022-10-31 MED ORDER — CHLORHEXIDINE GLUCONATE CLOTH 2 % EX PADS
6.0000 | MEDICATED_PAD | Freq: Every day | CUTANEOUS | Status: DC
  Administered 2022-11-02: 6 via TOPICAL

## 2022-10-31 MED ORDER — HEPARIN SODIUM (PORCINE) 1000 UNIT/ML IJ SOLN
INTRAMUSCULAR | Status: AC
  Filled 2022-10-31: qty 10

## 2022-10-31 MED ORDER — MIDAZOLAM HCL 2 MG/2ML IJ SOLN
INTRAMUSCULAR | Status: DC | PRN
  Administered 2022-10-31: 2 mg via INTRAVENOUS

## 2022-10-31 MED ORDER — HEPARIN (PORCINE) IN NACL 1000-0.9 UT/500ML-% IV SOLN
INTRAVENOUS | Status: DC | PRN
  Administered 2022-10-31 (×2): 500 mL

## 2022-10-31 MED ORDER — LORAZEPAM 2 MG/ML IJ SOLN: 0.5000 mg | Freq: Once | INTRAMUSCULAR | Status: DC | PRN

## 2022-10-31 MED ORDER — ASPIRIN 81 MG PO CHEW
81.0000 mg | CHEWABLE_TABLET | Freq: Every day | ORAL | Status: DC
  Administered 2022-11-01 – 2022-11-02 (×2): 81 mg via ORAL
  Filled 2022-10-31 (×3): qty 1

## 2022-10-31 MED ORDER — INSULIN ASPART 100 UNIT/ML IJ SOLN: 0.0000 [IU] | Freq: Every day | INTRAMUSCULAR | Status: DC

## 2022-10-31 MED ORDER — LIDOCAINE HCL (PF) 1 % IJ SOLN
INTRAMUSCULAR | Status: AC
  Filled 2022-10-31: qty 30

## 2022-10-31 MED ORDER — FENTANYL CITRATE (PF) 100 MCG/2ML IJ SOLN
INTRAMUSCULAR | Status: DC | PRN
  Administered 2022-10-31: 25 ug via INTRAVENOUS

## 2022-10-31 MED ORDER — MIDAZOLAM HCL 2 MG/2ML IJ SOLN
INTRAMUSCULAR | Status: AC
  Filled 2022-10-31: qty 2

## 2022-10-31 MED ORDER — FENTANYL CITRATE (PF) 100 MCG/2ML IJ SOLN
INTRAMUSCULAR | Status: AC
  Filled 2022-10-31: qty 2

## 2022-10-31 MED ORDER — IOHEXOL 350 MG/ML SOLN
INTRAVENOUS | Status: DC | PRN
  Administered 2022-10-31: 85 mL

## 2022-10-31 MED ORDER — SODIUM CHLORIDE 0.9% FLUSH
3.0000 mL | Freq: Two times a day (BID) | INTRAVENOUS | Status: DC
  Administered 2022-10-31 – 2022-11-01 (×2): 3 mL via INTRAVENOUS

## 2022-10-31 MED ORDER — LORAZEPAM 2 MG/ML IJ SOLN
0.5000 mg | Freq: Once | INTRAMUSCULAR | Status: AC | PRN
  Administered 2022-10-31: 0.5 mg via INTRAVENOUS
  Filled 2022-10-31: qty 1

## 2022-10-31 MED ORDER — SODIUM CHLORIDE 0.9% FLUSH
3.0000 mL | Freq: Two times a day (BID) | INTRAVENOUS | Status: DC
  Administered 2022-10-31 – 2022-11-02 (×4): 3 mL via INTRAVENOUS

## 2022-10-31 MED ORDER — HYDROMORPHONE HCL 1 MG/ML IJ SOLN
0.5000 mg | Freq: Once | INTRAMUSCULAR | Status: AC | PRN
  Administered 2022-10-31: 0.5 mg via INTRAVENOUS
  Filled 2022-10-31: qty 1

## 2022-10-31 MED ORDER — HEPARIN (PORCINE) 25000 UT/250ML-% IV SOLN
1250.0000 [IU]/h | INTRAVENOUS | Status: DC
  Administered 2022-10-31: 1250 [IU]/h via INTRAVENOUS
  Filled 2022-10-31: qty 250

## 2022-10-31 MED ORDER — HEPARIN SODIUM (PORCINE) 5000 UNIT/ML IJ SOLN
5000.0000 [IU] | Freq: Three times a day (TID) | INTRAMUSCULAR | Status: DC
  Administered 2022-10-31 – 2022-11-02 (×6): 5000 [IU] via SUBCUTANEOUS
  Filled 2022-10-31 (×7): qty 1

## 2022-10-31 MED ORDER — SODIUM CHLORIDE 0.9% FLUSH: 3.0000 mL | INTRAVENOUS | Status: DC | PRN

## 2022-10-31 MED ORDER — ASPIRIN 81 MG PO TBEC: 81.0000 mg | DELAYED_RELEASE_TABLET | Freq: Every day | ORAL | Status: DC

## 2022-10-31 MED ORDER — ASPIRIN 81 MG PO CHEW: 81.0000 mg | CHEWABLE_TABLET | ORAL | Status: DC

## 2022-10-31 MED ORDER — SEVELAMER CARBONATE 800 MG PO TABS
4800.0000 mg | ORAL_TABLET | Freq: Two times a day (BID) | ORAL | Status: DC
  Administered 2022-10-31 – 2022-11-02 (×4): 4800 mg via ORAL
  Filled 2022-10-31 (×5): qty 6

## 2022-10-31 MED ORDER — SENNOSIDES-DOCUSATE SODIUM 8.6-50 MG PO TABS
1.0000 | ORAL_TABLET | Freq: Two times a day (BID) | ORAL | Status: DC
  Administered 2022-10-31 – 2022-11-02 (×4): 1 via ORAL
  Filled 2022-10-31 (×4): qty 1

## 2022-10-31 MED ORDER — LABETALOL HCL 5 MG/ML IV SOLN: 10.0000 mg | INTRAVENOUS | Status: AC | PRN

## 2022-10-31 MED ORDER — INSULIN ASPART 100 UNIT/ML IJ SOLN
0.0000 [IU] | Freq: Three times a day (TID) | INTRAMUSCULAR | Status: DC
  Administered 2022-11-01 – 2022-11-02 (×2): 2 [IU] via SUBCUTANEOUS

## 2022-10-31 MED ORDER — OXYCODONE-ACETAMINOPHEN 7.5-325 MG PO TABS
1.0000 | ORAL_TABLET | Freq: Three times a day (TID) | ORAL | Status: DC | PRN
  Administered 2022-10-31 – 2022-11-02 (×4): 1 via ORAL
  Filled 2022-10-31 (×4): qty 1

## 2022-10-31 MED ORDER — ACETAMINOPHEN 325 MG PO TABS: 650.0000 mg | ORAL_TABLET | ORAL | Status: DC | PRN

## 2022-10-31 MED ORDER — NITROGLYCERIN IN D5W 200-5 MCG/ML-% IV SOLN: 0.0000 ug/min | INTRAVENOUS | Status: DC

## 2022-10-31 SURGICAL SUPPLY — 12 items
BAND CMPR LRG ZPHR (HEMOSTASIS)
BAND ZEPHYR COMPRESS 30 LONG (HEMOSTASIS) IMPLANT
CATH INFINITI 5FR MULTPACK ANG (CATHETERS) IMPLANT
CLOSURE MYNX CONTROL 5F (Vascular Products) IMPLANT
KIT HEART LEFT (KITS) ×2 IMPLANT
MAT PREVALON FULL STRYKER (MISCELLANEOUS) IMPLANT
PACK CARDIAC CATHETERIZATION (CUSTOM PROCEDURE TRAY) ×2 IMPLANT
SHEATH PINNACLE 5F 10CM (SHEATH) IMPLANT
SHEATH PROBE COVER 6X72 (BAG) IMPLANT
TRANSDUCER W/STOPCOCK (MISCELLANEOUS) ×2 IMPLANT
TUBING CIL FLEX 10 FLL-RA (TUBING) ×2 IMPLANT
WIRE EMERALD 3MM-J .035X150CM (WIRE) IMPLANT

## 2022-10-31 NOTE — Inpatient Diabetes Management (Signed)
Inpatient Diabetes Program Recommendations  AACE/ADA: New Consensus Statement on Inpatient Glycemic Control (2015)  Target Ranges:  Prepandial:   less than 140 mg/dL      Peak postprandial:   less than 180 mg/dL (1-2 hours)      Critically ill patients:  140 - 180 mg/dL   Lab Results  Component Value Date   GLUCAP 89 10/31/2022   HGBA1C 8.0 (A) 07/20/2022    Review of Glycemic Control  Latest Reference Range & Units 10/30/22 22:19 10/31/22 00:08 10/31/22 04:00 10/31/22 07:42  Glucose-Capillary 70 - 99 mg/dL 578 (H) 469 (H) 629 (H) 89   Diabetes history: DM 2 Outpatient Diabetes medications:  Mounjaro 5 mg weekly Current orders for Inpatient glycemic control:  Semglee 5 units  Inpatient Diabetes Program Recommendations:    Will follow. Patient currently NPO.  Agree with CBG's q 4 hours.    Thanks,  Beryl Meager, RN, BC-ADM Inpatient Diabetes Coordinator Pager [phone]  (8a-5p)

## 2022-10-31 NOTE — H&P (View-Only) (Signed)
 Cardiology Consultation   Patient ID: [name] [name] MRN: [id number]; DOB: 12/12/1975  Admit date: 10/30/2022 Date of Consult: 10/31/2022  PCP:  Katsadouros, Vasilios, MD   [facility] Providers Cardiologist:  None        Patient Profile:   [name] [name] is a 47 y.o. female with a hx of ESRD and T2DM who is being seen 10/31/2022 for the evaluation of NSTEMI at the request of Dr. Narendra  History of Present Illness:   [name] [name] is a 47 y.o. female with a hx of ESRD and T2DM who is being seen 10/31/2022 for the evaluation of NSTEMI at the request of Dr. Narendra. She came to the ED with complains of abdominal, nausea, vomiting and chest pain. The chest pais is central- started Thursday. Non-exertional. Non radiating. Associated with abdominal pain. She has had nausea with several episodes of vomiting. Her initial troponin was negative; but repeat was 400 hence cardiology was consult. EKG showed no ischemic changes- prominent T waves. She states has never had any cardiac issues prior.   Past Medical History:  Diagnosis Date   Abdominal muscle pain 09/08/2016   Abnormal Pap smear of cervix 2009   Abscess    history of multiple abscesses   Acute bilateral low back pain 02/13/2017   Acute blood loss anemia    Hx  Iron infusions   Acute on chronic renal failure (HCC) 07/12/2012   Acute renal failure (HCC) 07/12/2012   Acute respiratory failure with hypoxia (HCC) 10/21/2019   Adjustment disorder with depressed mood 03/26/2017   AKI (acute kidney injury) (HCC)    Dialysis t-th-s   Anemia 10/21/2019   Anemia of chronic disease 2002   Anginal pain (HCC)    Anxiety    Panic attacks   Bilateral lower extremity edema 05/13/2016   Bipolar disorder (HCC)    Cataract    B/L cataract   Cellulitis 05/21/2014   right eye   CHF (congestive heart failure) (HCC)    Chronic bronchitis (HCC)    "get it q yr" (05/13/2013)   Chronic pain    Chronic pain of right knee  09/08/2016   Crutches as ambulation aid    uses forearm crutchs, Left  AKA with prosthetic.   Dehiscence of amputation stump (HCC)    Depression    Edema of lower extremity    Endocarditis 2002   subacute bacterial endocarditis.    Fall    Family history of anesthesia complication    "my mom has a hard time coming out from under"   Fibromyalgia    GERD (gastroesophageal reflux disease)    occ   Heart murmur    never has had any problems   Herpes simplex type 1 infection 01/16/2018   History of blood transfusion    "just low blood count" (05/13/2013)   History of sexually transmitted infection    Hyperlipidemia    Hypertension    Hypothyroidism    Hypothyroidism, adult 03/21/2014   Hypoxia    Leukocytosis    Muscle spasm 12/16/2020   Necrosis (HCC)    and ulceration   Necrotizing fasciitis s/p OR debridements 07/06/2012   Obesity    OSA on CPAP    does not use CPAP   Peripheral neuropathy    Pneumonia    Pyelonephritis 12/31/2020   Routine adult health maintenance 12/08/2016   Severe protein-calorie malnutrition (HCC)    Status post below knee amputation, left (HCC) 04/11/2017   Type II diabetes mellitus (  HCC)    Type  II   Vaginal irritation 03/14/2016   Weak pulse 07/21/2021   Wound dehiscence 09/04/2019    Past Surgical History:  Procedure Laterality Date   A/V FISTULAGRAM Left 05/12/2021   Procedure: A/V FISTULAGRAM;  Surgeon: Robins, Joshua E, MD;  [facility];  Service: Cardiovascular;  Laterality: Left;   ABDOMINAL AORTOGRAM W/LOWER EXTREMITY Left 03/19/2019   Procedure: ABDOMINAL AORTOGRAM W/LOWER EXTREMITY;  Surgeon: Brabham, Vance W, MD;  [facility]: [facility];  Service: Cardiovascular;  Laterality: Left;   ABOVE KNEE LEG AMPUTATION Left 10/11/2019   wound dehisence    AIR/FLUID EXCHANGE Right 12/31/2019   Procedure: AIR/FLUID EXCHANGE;  Surgeon: Patel, Narendra, MD;  [facility]: [facility];  Service: Ophthalmology;  Laterality: Right;    AMPUTATION Left 04/07/2017   Procedure: LEFT BELOW KNEE AMPUTATION;  Surgeon: Duda, Marcus V, MD;  [facility]: [facility];  Service: Orthopedics;  Laterality: Left;   AMPUTATION Left 10/11/2019   Procedure: LEFT ABOVE KNEE AMPUTATION;  Surgeon: Duda, Marcus V, MD;  [facility];  Service: Orthopedics;  Laterality: Left;   AV FISTULA PLACEMENT Left 01/05/2021   Procedure: ARTERIOVENOUS (AV) FISTULA CREATION vs. GRAFT;  Surgeon: Dickson, Christopher S, MD;  [facility];  Service: Vascular;  Laterality: Left;   CATARACT EXTRACTION W/PHACO Right 02/11/2020   Procedure: CATARACT EXTRACTION PHACO AND INTRAOCULAR LENS PLACEMENT (IOC);  Surgeon: Patel, Narendra, MD;  [facility];  Service: Ophthalmology;  Laterality: Right;   EYE SURGERY     lazer   FISTULA SUPERFICIALIZATION Left 02/23/2021   Procedure: SUPERFICIALIZATION OF LEFT BRACHIOCEPHALIC FISTULA;  Surgeon: Dickson, Christopher S, MD;  [facility];  Service: Vascular;  Laterality: Left;   GAS INSERTION Right 12/31/2019   Procedure: INSERTION OF GAS;  Surgeon: Patel, Narendra, MD;  [facility];  Service: Ophthalmology;  Laterality: Right;   INCISION AND DRAINAGE ABSCESS     multiple I&Ds   INCISION AND DRAINAGE ABSCESS Left 07/09/2012   Procedure: DRESSING CHANGE, THIGH WOUND;  Surgeon: Douglas A Blackman, MD;  [facility];  Service: General;  Laterality: Left;   INCISION AND DRAINAGE OF WOUND Left 07/07/2012   Procedure: IRRIGATION AND DEBRIDEMENT WOUND;  Surgeon: Douglas A Blackman, MD;  [facility];  Service: General;  Laterality: Left;   INCISION AND DRAINAGE PERIRECTAL ABSCESS Left 07/14/2012   Procedure: DEBRIDEMENT OF SKIN & SOFT TISSUE; DRESSING CHANGE UNDER ANESTHESIA;  Surgeon: Eric M Wilson, MD,FACS;  [facility];  Service: General;  Laterality: Left;   INCISION AND DRAINAGE PERIRECTAL ABSCESS Left 07/16/2012   Procedure: I&D Left Thigh;  Surgeon: James O Wyatt, MD;  [facility];  Service: General;   Laterality: Left;   INCISION AND DRAINAGE PERIRECTAL ABSCESS N/A 01/05/2015   Procedure: IRRIGATION AND DEBRIDEMENT PERIRECTAL ABSCESS;  Surgeon: Matthew Tsuei, MD;  [facility];  Service: General;  Laterality: N/A;   INSERTION OF DIALYSIS CATHETER Right 01/05/2021   Procedure: INSERTION OF DIALYSIS CATHETER;  Surgeon: Dickson, Christopher S, MD;  [facility];  Service: Vascular;  Laterality: Right;   IRRIGATION AND DEBRIDEMENT ABSCESS Left 07/06/2012   Procedure: IRRIGATION AND DEBRIDEMENT ABSCESS BUTTOCKS AND THIGH;  Surgeon: David H Newman, MD;  [facility];  Service: General;  Laterality: Left;   IRRIGATION AND DEBRIDEMENT ABSCESS Left 08/10/2012   Procedure: IRRIGATION AND DEBRIDEMENT ABSCESS;  Surgeon: Brian Layton, DO;  [facility];  Service: General;  Laterality: Left;   MEMBRANE PEEL Left 07/24/2022     Procedure: MEMBRANE PEEL;  Surgeon: Patel, Narendra, MD;  [facility];  Service: Ophthalmology;  Laterality: Left;   PARS PLANA VITRECTOMY Right 12/31/2019   Procedure: PARS PLANA VITRECTOMY WITH 25 GAUGE;  Surgeon: Patel, Narendra, MD;  [facility];  Service: Ophthalmology;  Laterality: Right;   PARS PLANA VITRECTOMY Left 07/24/2022   Procedure: PARS PLANA VITRECTOMY WITH 25 GAUGE;  Surgeon: Patel, Narendra, MD;  [facility];  Service: Ophthalmology;  Laterality: Left;   PERIPHERAL VASCULAR INTERVENTION Left 05/12/2021   Procedure: PERIPHERAL VASCULAR INTERVENTION;  Surgeon: Robins, Joshua E, MD;  [facility];  Service: Cardiovascular;  Laterality: Left;  arm fistula   PHOTOCOAGULATION Left 07/24/2022   Procedure: AIRGAS, SILICONE OIL, PHOTOCOAGULATION;  Surgeon: Patel, Narendra, MD;  [facility];  Service: Ophthalmology;  Laterality: Left;   PHOTOCOAGULATION WITH LASER Right 12/31/2019   Procedure: PHOTOCOAGULATION WITH LASER;  Surgeon: Patel, Narendra, MD;  [facility];  Service: Ophthalmology;  Laterality: Right;   REPAIR OF COMPLEX  TRACTION RETINAL DETACHMENT Right 12/31/2019   Procedure: REPAIR OF COMPLEX TRACTION RETINAL;  Surgeon: Patel, Narendra, MD;  [facility];  Service: Ophthalmology;  Laterality: Right;   REPAIR OF COMPLEX TRACTION RETINAL DETACHMENT Left 07/24/2022   Procedure: REPAIR OF COMPLEX TRACTION RETINAL DETACHMENT;  Surgeon: Patel, Narendra, MD;  [facility];  Service: Ophthalmology;  Laterality: Left;   STUMP REVISION Left 06/09/2017   Procedure: REVISION LEFT BELOW KNEE AMPUTATION;  Surgeon: Duda, Marcus V, MD;  [facility];  Service: Orthopedics;  Laterality: Left;   STUMP REVISION Left 09/04/2019   Procedure: LEFT BELOW KNEE AMPUTATION REVISION;  Surgeon: Duda, Marcus V, MD;  [facility];  Service: Orthopedics;  Laterality: Left;   STUMP REVISION Left 09/20/2019   Procedure: LEFT BELOW KNEE AMPUTATION REVISION;  Surgeon: Duda, Marcus V, MD;  [facility];  Service: Orthopedics;  Laterality: Left;   TONSILLECTOMY  1994       Inpatient Medications: Scheduled Meds:  acetaminophen  1,000 mg Oral Q6H   aspirin  325 mg Oral Daily    HYDROmorphone (DILAUDID) injection  1 mg Intravenous Once   insulin glargine-yfgn  5 Units Subcutaneous QHS   metoprolol succinate  25 mg Oral Daily   pantoprazole (PROTONIX) IV  40 mg Intravenous Q24H   Continuous Infusions:  heparin 1,250 Units/hr (10/31/22 0155)   nitroGLYCERIN     PRN Meds: nitroGLYCERIN  Allergies:    Allergies  Allergen Reactions   Vancomycin Other (See Comments)    Acute renal failure suspected secondary to vanco    Social History:   Social History   Socioeconomic History   Marital status: Single    Spouse name: Not on file   Number of children: 0   Years of education: 11   Highest education level: Not on file  Occupational History   Occupation: disability  Tobacco Use   Smoking status: Former    Packs/day: 0.50    Years: 16.00    Additional pack years: 0.00    Total pack years: 8.00    Types:  Cigarettes, E-cigarettes    Quit date: 08/2021    Years since quitting: 1.1   Smokeless tobacco: Never  Vaping Use   Vaping Use: Some days   Substances: Nicotine, THC, CBD, Flavoring   Devices: CBD oil  Substance and Sexual Activity   Alcohol use: Yes    Alcohol/week: 0.0 standard drinks of alcohol    Comment: social   Drug use:   Not Currently    Types: Marijuana   Sexual activity: Yes    Birth control/protection: Condom  Other Topics Concern   Not on file  Social History Narrative   Lives with mother currently, goddaughter and uncle in a one story home.     On disability for diabetes, neuropathy, sleep apnea etc.  Disability since 2014.    Education: 11th grade.    Social Determinants of Health   Financial Resource Strain: High Risk (12/06/2021)   Overall Financial Resource Strain (CARDIA)    Difficulty of Paying Living Expenses: Hard  Food Insecurity: Food Insecurity Present (12/06/2021)   Hunger Vital Sign    Worried About Running Out of Food in the Last Year: Sometimes true    Ran Out of Food in the Last Year: Sometimes true  Transportation Needs: No Transportation Needs (12/10/2021)   PRAPARE - Transportation    Lack of Transportation (Medical): No    Lack of Transportation (Non-Medical): No  Recent Concern: Transportation Needs - Unmet Transportation Needs (12/06/2021)   PRAPARE - Transportation    Lack of Transportation (Medical): Yes    Lack of Transportation (Non-Medical): Yes  Physical Activity: Not on file  Stress: Stress Concern Present (12/06/2021)   [employer] - Occupational Stress Questionnaire    Feeling of Stress : Rather much  Social Connections: Moderately Integrated (12/06/2021)   Social Connection and Isolation Panel [NHANES]    Frequency of Communication with Friends and Family: More than three times a week    Frequency of Social Gatherings with Friends and Family: Once a week    Attends Religious Services: 1 to 4 times per  year    Active Member of Clubs or Organizations: No    Attends Club or Organization Meetings: Never    Marital Status: Living with partner  Intimate Partner Violence: Not At Risk (12/06/2021)   Humiliation, Afraid, Rape, and Kick questionnaire    Fear of Current or Ex-Partner: No    Emotionally Abused: No    Physically Abused: No    Sexually Abused: No    Family History:   Family History  Problem Relation Age of Onset   Heart failure Mother    Diabetes Mother    Kidney disease Mother    Kidney disease Father    Diabetes Father    Diabetes Paternal Grandmother    Heart failure Paternal Grandmother    Other Other      ROS:  Please see the history of present illness.  All other ROS reviewed and negative.     Physical Exam/Data:   Vitals:   10/31/22 0215 10/31/22 0235 10/31/22 0252 10/31/22 0320  BP: (!) 189/137 120/83 118/82 128/87  Pulse: (!) 109 (!) 114 (!) 108 (!) 110  Resp: 19 (!) 29 (!) 23 15  Temp:      TempSrc:      SpO2: 100% 100% 100% 100%  Weight:      Height:       No intake or output data in the 24 hours ending 10/31/22 0323    10/30/2022   11:23 AM 09/02/2022    8:51 AM 07/24/2022    7:29 AM  Last 3 Weights  Weight (lbs) 289 lb 289 lb 12.8 oz 284 lb 2.8 oz  Weight (kg) 131.09 kg 131.452 kg 128.9 kg     Body mass index is 43.94 kg/m.  General:  Well nourished, well developed, in no acute distress HEENT: normal Neck: no JVD Vascular: No carotid   bruits; Distal pulses 2+ bilaterally Cardiac:  normal S1, S2; RRR; no murmur  Lungs: Poor airflow Abd: soft, nontender, no hepatomegaly  Ext: no edema Musculoskeletal:  No deformities, BUE and BLE strength normal and equal Skin: warm and dry  Neuro:  CNs 2-12 intact, no focal abnormalities noted Psych:  Normal affect   EKG:  The EKG was personally reviewed and demonstrates: no ST elevation. Prominent T waves  Laboratory Data:  High Sensitivity Troponin:   Recent Labs  Lab 10/30/22 1204 10/30/22 1607  10/30/22 2348  TROPONINIHS 8 67* 445*     Chemistry Recent Labs  Lab 10/30/22 1204  NA 135  K 4.1  CL 95*  CO2 25  GLUCOSE 191*  BUN 23*  CREATININE 6.23*  CALCIUM 10.1  GFRNONAA 8*  ANIONGAP 15    Recent Labs  Lab 10/30/22 1204  PROT 8.7*  ALBUMIN 4.1  AST 21  ALT 18  ALKPHOS 150*  BILITOT 0.9   Lipids No results for input(s): "CHOL", "TRIG", "HDL", "LABVLDL", "LDLCALC", "CHOLHDL" in the last 168 hours.  Hematology Recent Labs  Lab 10/30/22 1204  WBC 6.4  RBC 4.60  HGB 13.1  HCT 40.9  MCV 88.9  MCH 28.5  MCHC 32.0  RDW 13.3  PLT 166   Thyroid No results for input(s): "TSH", "FREET4" in the last 168 hours.  BNPNo results for input(s): "BNP", "PROBNP" in the last 168 hours.  DDimer No results for input(s): "DDIMER" in the last 168 hours.   Radiology/Studies:  CT ABDOMEN PELVIS WO CONTRAST  Result Date: 10/30/2022 CLINICAL DATA:  Abdominal pain. EXAM: CT ABDOMEN AND PELVIS WITHOUT CONTRAST TECHNIQUE: Multidetector CT imaging of the abdomen and pelvis was performed following the standard protocol without IV contrast. RADIATION DOSE REDUCTION: This exam was performed according to the departmental dose-optimization program which includes automated exposure control, adjustment of the mA and/or kV according to patient size and/or use of iterative reconstruction technique. COMPARISON:  December 31, 2020 FINDINGS: Lower chest: Moderate-severity atelectatic changes are seen within the bilateral lung bases. Hepatobiliary: No focal liver abnormality is seen. Numerous tiny gallstones are seen within the lumen of a mildly distended gallbladder. There is no evidence of gallbladder wall thickening, pericholecystic inflammation or biliary dilatation. Pancreas: Unremarkable. No pancreatic ductal dilatation or surrounding inflammatory changes. Spleen: Normal in size without focal abnormality. Adrenals/Urinary Tract: Adrenal glands are unremarkable. The kidneys are slightly small in  size, without renal calculi or hydronephrosis. A 1.8 cm cyst is seen within the mid right kidney. A 7 mm lipoma is seen within the posterior aspect of the mid left kidney. Bladder is unremarkable. Stomach/Bowel: Stomach is within normal limits. Appendix appears normal. No evidence of bowel wall thickening, distention, or inflammatory changes. Small, noninflamed diverticula are seen within the mid to distal sigmoid colon. Vascular/Lymphatic: Aortic atherosclerosis. No enlarged abdominal or pelvic lymph nodes. Reproductive: Uterus and bilateral adnexa are unremarkable. Other: There is a small, fat containing umbilical hernia. No abdominopelvic ascites. Musculoskeletal: No acute or significant osseous findings. IMPRESSION: 1. Cholelithiasis without evidence of acute cholecystitis. 2. Sigmoid diverticulosis. 3. Small, fat containing umbilical hernia. 4. Small right renal cyst and left renal lipoma. No follow-up imaging is recommended. This recommendation follows ACR consensus guidelines: Management of the Incidental Renal Mass on CT: A White Paper of the ACR Incidental Findings Committee. J Am Coll Radiol 2018;15:264-273. 5. Moderate severity bibasilar atelectasis. 6. Aortic atherosclerosis. Aortic Atherosclerosis (ICD10-I70.0). Electronically Signed   By: Thaddeus  Houston M.D.   On: 10/30/2022 18:23     DG Chest 2 View  Result Date: 10/30/2022 CLINICAL DATA:  Upper abdominal pain. EXAM: CHEST - 2 VIEW COMPARISON:  01/05/2021 FINDINGS: Stable scarring right mid lung. No focal pneumonia or substantial pleural effusion. Mild vascular congestion evident. The cardiopericardial silhouette is within normal limits for size. Vascular stent device overlies the left subclavian region. IMPRESSION: Vascular congestion. Component of underlying mild interstitial edema not excluded. Electronically Signed   By: Eric  Mansell M.D.   On: 10/30/2022 12:59     Assessment and Plan:   # NSTEMI # ESRD  Patient presenting with  atypical chest pain. However has significant risk factors With sharp delta in troponin, will treat as NSTEMI Start IV Heparin Aspirin load 325 mg followed by 81 mg daily Atorvastatin 80 mg LHC in AM Echo in AM BP management as per primary Continue home amlodipine and metoprolol Okay to do NTG Telemetry NPO at midnight   For questions or updates, please contact [facility] Please consult [email or web address] for contact info under    Signed, Jamilyn Pigeon S Sawyer Mentzer, MD  10/31/2022 3:23 AM  

## 2022-10-31 NOTE — Progress Notes (Addendum)
Called to bedside by nurse [place].  They report that this patient was uncomfortable and clammy.  The patient states that she felt like she had to have a bowel movement but was unable to.  She is having some abdominal discomfort, in a bandlike fashion around her abdomen.  She has very minimal chest discomfort tonight.  She was having some nausea and dry heaving prior to my arrival for evaluation.  Last bowel movement was yesterday morning.  She reported a lot of straining and it was somewhat small and insignificant.  On exam heart rate is around 90, BP is elevated measured on the wrist to 200/100, respiratory rate is 15, SpO2 >94% on room air.  She is somnolent appearing female, does not appear to be in distress.  Abdomen is soft and nontender.  Skin is warm and dry.  She is alert to verbal and oriented.  EKG shows sinus rhythm, inverted T waves in aVL, otherwise no changes from last EKG.  47 year old admitted for ACS found to have nonobstructive CAD on LHC.  She is also on chronic opioid therapy for pain and got several doses of IV hydromorphone yesterday.  HPI for this acute event is suggestive of a vagal episode secondary to constipation.  No worsening chest pain tonight, EKG is nonspecific, overall reassuring.  Somnolent, but this is probably due to recent administration of Ativan for nausea.  Will start bowel regimen, senna-docusate once now, then twice daily.  Will repeat blood pressure with appropriately sized cuff on upper arm as I suspect her wrist BP measurements are falsely elevated.  Marrianne Mood MD 10/31/2022, 11:43 PM   Update 11/01/22 3:40 AM Called to bedside for chest pain. Described as same pain that brought her in to the ED initially. Rated severe, 10/10. Associated with nausea and dry heaving. Tachycardic to 105. Respirations 12. BP ~200/100 measured on right upper arm with appropriately sized cuff, corroborated on right lower extremity as well, and by pulse palpation during  measurement. SpO2 97% on room air. Uncomfortable appearing on exam. Strong radial and DP pulses. No apparent JVD or LE edema. Lungs without wheezing or crackles. Repeat EKG with increased T wave amplitude in infero-lateral leads, T wave inversion in aVL. Pain improved to 7-/10 after 3 doses SL nitroglycerine. BP stably elevated. Going to repeat troponin, but given non-obstructive disease on LHC think this is another episode of demand ischemia. Continue to monitor BP closely and will treat as severe symptomatic hypertension tonight. Labetalol 10 mg IV x 1 ordered.

## 2022-10-31 NOTE — Interval H&P Note (Signed)
Cath Lab Visit (complete for each Cath Lab visit)  Clinical Evaluation Leading to the Procedure:   ACS: Yes.    Non-ACS:    Anginal Classification: CCS II  Anti-ischemic medical therapy: Minimal Therapy (1 class of medications)  Non-Invasive Test Results: No non-invasive testing performed  Prior CABG: No previous CABG      History and Physical Interval Note:  10/31/2022 1:12 PM  [name] [name]  has presented today for surgery, with the diagnosis of nstemi.  The various methods of treatment have been discussed with the patient and family. After consideration of risks, benefits and other options for treatment, the patient has consented to  Procedure(s): LEFT HEART CATH AND CORONARY ANGIOGRAPHY (N/A) as a surgical intervention.  The patient's history has been reviewed, patient examined, no change in status, stable for surgery.  I have reviewed the patient's chart and labs.  Questions were answered to the patient's satisfaction.     Nicki Guadalajara

## 2022-10-31 NOTE — Progress Notes (Signed)
Subjective:  Patient reports she was at home yesterday when she started experiencing chest pain while sitting down. The pain felt squeezing in nature and was located from her sternum to epigastric region. The pain did not radiate. Shortly after the chest pain started, she began feeling sweaty and nauseous. She then started vomiting. Her pain was only relieved by the medicines she received in the ED.   When seen this morning, patient reports feeling much better. She is no longer having chest pain or abdominal pain. She has not vomited since around 3 am. She understood that she likely had a heart attack and would be undergoing more testing today. She requested pain medication for chronic pain that she usually takes percocet for at home. All questions and concerns were addressed.   Objective:  Vital signs in last 24 hours: Vitals:   10/31/22 0345 10/31/22 0400 10/31/22 0458 10/31/22 0744  BP: 136/89 114/67 (!) 110/9 112/75  Pulse: (!) 109 (!) 107 98 94  Resp: (!) 30 19 20 17   Temp:   98.3 F (36.8 C) 98.3 F (36.8 C)  TempSrc:   Oral Oral  SpO2: 100% 100% 100% 97%  Weight:   51.8 kg   Height:   5\' 9"  (1.753 m)    Physical Exam: Constitutional: Sitting comfortably in bed watching TV. No acute distress.  Cardiovascular: Regular rate, regular rhythm. No murmurs, rubs, or gallops. Pulmonary: Normal respiratory effort on room air. No wheezes, rales, rhonchi, or crackles.   Abdominal: Soft. Non-distended. No tenderness. Normal bowel sounds.  Musculoskeletal: Normal muscle bulk. No edema in RLE or left residual limb. Neurological: Alert and oriented to person, place, and time. Skin: Warm and dry. Hyperpigmented spots on upper extremities and upper back.   Assessment/Plan:  Principal Problem:   NSTEMI (non-ST elevated myocardial infarction) (HCC) Active Problems:   Hypertension associated with diabetes (HCC)   Type 2 diabetes mellitus (HCC)   End-stage renal disease on hemodialysis  (HCC)  [name] [name] is a 47 year-old with ESRD, type 2 diabetes, and atherosclerotic cardiovascular disease who presented with chest pain, abdominal pain, nausea, and vomiting. Patient was admitted on 6/9 for rule out of ACS and found to have NSTEMI.  # Chest pain # NSTEMI Troponin 6>60>450>649 overnight. Patient was given aspirin 325 mg and started on heparin drip. EKG this morning with new T wave inversions in inferior leads. Cardiology was consulted and recommended LHC and echocardiogram today (6/10). Will continue IV heparin, aspirin 81 mg daily, and atorvastatin 80 mg daily. - Appreciate cardiology recommendations - Aspirin 81 mg daily - Atorvastatin 80 mg daily - f/u echocardiogram - f/u LHC  # Abdominal pain/nausea/vomiting, resolved Patient reports abdominal pain has resolved. Suspect symptoms were related to chest pain. Will continue monitoring.  # Severe hypertension Blood pressure well-controlled with MAPs in 70-80s overnight. Suspect initial elevated readings were partially due to poor placement of blood pressure cuff as BP returned to normal after repositioning. Will continue home metoprolol succinate 25 mg daily. Will hold home amlodipine 10 mg at this time. - Hold amlodipine 10 mg daily - Metoprolol succinate 25 mg daily  # ESRD Patient on outpatient T/Th/S hemodialysis schedule. She last received HD on 6/8. Consulted nephrology to schedule hemodialysis while hospitalized. - Appreciate nephrology assistance  # Type 2 diabetes Fasting glucose 89 this morning. Will continue glargine 5 units nightly. Will start sensitive SSI with meals since glucose elevated around 200s overnight.  - Glargine 5 units nightly - Start sensitive SSI  #  Vitamin D deficiency Vitamin D at 4.45 on admission. Will provide supplementation. - Vitamin D 50,000 units once weekly  Diet: NPO Bowel: none VTE: heparin IVF: none Code: Full LOS: day 1  Whitman Hero, Medical  Student 10/31/2022, 7:49 AM On call pager: [phone]

## 2022-10-31 NOTE — Consult Note (Signed)
Cardiology Consultation   Patient ID: [name] [name] MRN: [id number]; DOB: 1975-09-24  Admit date: 10/30/2022 Date of Consult: 10/31/2022  PCP:  Belva Agee, MD   [facility] Providers Cardiologist:  None        Patient Profile:   [name] [name] is a 47 y.o. female with a hx of ESRD and T2DM who is being seen 10/31/2022 for the evaluation of NSTEMI at the request of Dr. Heide Spark  History of Present Illness:   [name] [name] is a 47 y.o. female with a hx of ESRD and T2DM who is being seen 10/31/2022 for the evaluation of NSTEMI at the request of Dr. Heide Spark. She came to the ED with complains of abdominal, nausea, vomiting and chest pain. The chest pais is central- started Thursday. Non-exertional. Non radiating. Associated with abdominal pain. She has had nausea with several episodes of vomiting. Her initial troponin was negative; but repeat was 400 hence cardiology was consult. EKG showed no ischemic changes- prominent T waves. She states has never had any cardiac issues prior.   Past Medical History:  Diagnosis Date   Abdominal muscle pain 09/08/2016   Abnormal Pap smear of cervix 2009   Abscess    history of multiple abscesses   Acute bilateral low back pain 02/13/2017   Acute blood loss anemia    Hx  Iron infusions   Acute on chronic renal failure (HCC) 07/12/2012   Acute renal failure (HCC) 07/12/2012   Acute respiratory failure with hypoxia (HCC) 10/21/2019   Adjustment disorder with depressed mood 03/26/2017   AKI (acute kidney injury) (HCC)    Dialysis t-th-s   Anemia 10/21/2019   Anemia of chronic disease 2002   Anginal pain (HCC)    Anxiety    Panic attacks   Bilateral lower extremity edema 05/13/2016   Bipolar disorder (HCC)    Cataract    B/L cataract   Cellulitis 05/21/2014   right eye   CHF (congestive heart failure) (HCC)    Chronic bronchitis (HCC)    "get it q yr" (05/13/2013)   Chronic pain    Chronic pain of right knee  09/08/2016   Crutches as ambulation aid    uses forearm crutchs, Left  AKA with prosthetic.   Dehiscence of amputation stump (HCC)    Depression    Edema of lower extremity    Endocarditis 2002   subacute bacterial endocarditis.    Fall    Family history of anesthesia complication    "my mom has a hard time coming out from under"   Fibromyalgia    GERD (gastroesophageal reflux disease)    occ   Heart murmur    never has had any problems   Herpes simplex type 1 infection 01/16/2018   History of blood transfusion    "just low blood count" (05/13/2013)   History of sexually transmitted infection    Hyperlipidemia    Hypertension    Hypothyroidism    Hypothyroidism, adult 03/21/2014   Hypoxia    Leukocytosis    Muscle spasm 12/16/2020   Necrosis (HCC)    and ulceration   Necrotizing fasciitis s/p OR debridements 07/06/2012   Obesity    OSA on CPAP    does not use CPAP   Peripheral neuropathy    Pneumonia    Pyelonephritis 12/31/2020   Routine adult health maintenance 12/08/2016   Severe protein-calorie malnutrition (HCC)    Status post below knee amputation, left (HCC) 04/11/2017   Type II diabetes mellitus (  HCC)    Type  II   Vaginal irritation 03/14/2016   Weak pulse 07/21/2021   Wound dehiscence 09/04/2019    Past Surgical History:  Procedure Laterality Date   A/V FISTULAGRAM Left 05/12/2021   Procedure: A/V FISTULAGRAM;  Surgeon: Victorino Sparrow, MD;  Location: [facility] INVASIVE CV LAB;  Service: Cardiovascular;  Laterality: Left;   ABDOMINAL AORTOGRAM W/LOWER EXTREMITY Left 03/19/2019   Procedure: ABDOMINAL AORTOGRAM W/LOWER EXTREMITY;  Surgeon: Nada Libman, MD;  Location: [facility] INVASIVE CV LAB;  Service: Cardiovascular;  Laterality: Left;   ABOVE KNEE LEG AMPUTATION Left 10/11/2019   wound dehisence    AIR/FLUID EXCHANGE Right 12/31/2019   Procedure: AIR/FLUID EXCHANGE;  Surgeon: Carmela Rima, MD;  Location: [facility] OR;  Service: Ophthalmology;  Laterality: Right;    AMPUTATION Left 04/07/2017   Procedure: LEFT BELOW KNEE AMPUTATION;  Surgeon: Nadara Mustard, MD;  Location: [facility] OR;  Service: Orthopedics;  Laterality: Left;   AMPUTATION Left 10/11/2019   Procedure: LEFT ABOVE KNEE AMPUTATION;  Surgeon: Nadara Mustard, MD;  Location: [facility] OR;  Service: Orthopedics;  Laterality: Left;   AV FISTULA PLACEMENT Left 01/05/2021   Procedure: ARTERIOVENOUS (AV) FISTULA CREATION vs. GRAFT;  Surgeon: Chuck Hint, MD;  Location: [facility] OR;  Service: Vascular;  Laterality: Left;   CATARACT EXTRACTION W/PHACO Right 02/11/2020   Procedure: CATARACT EXTRACTION PHACO AND INTRAOCULAR LENS PLACEMENT (IOC);  Surgeon: Carmela Rima, MD;  Location: [facility] OR;  Service: Ophthalmology;  Laterality: Right;   EYE SURGERY     lazer   FISTULA SUPERFICIALIZATION Left 02/23/2021   Procedure: SUPERFICIALIZATION OF LEFT BRACHIOCEPHALIC FISTULA;  Surgeon: Chuck Hint, MD;  Location: [facility] OR;  Service: Vascular;  Laterality: Left;   GAS INSERTION Right 12/31/2019   Procedure: INSERTION OF GAS;  Surgeon: Carmela Rima, MD;  Location: [facility] OR;  Service: Ophthalmology;  Laterality: Right;   INCISION AND DRAINAGE ABSCESS     multiple I&Ds   INCISION AND DRAINAGE ABSCESS Left 07/09/2012   Procedure: DRESSING CHANGE, THIGH WOUND;  Surgeon: Shelly Rubenstein, MD;  Location: [facility] OR;  Service: General;  Laterality: Left;   INCISION AND DRAINAGE OF WOUND Left 07/07/2012   Procedure: IRRIGATION AND DEBRIDEMENT WOUND;  Surgeon: Shelly Rubenstein, MD;  Location: [facility] OR;  Service: General;  Laterality: Left;   INCISION AND DRAINAGE PERIRECTAL ABSCESS Left 07/14/2012   Procedure: DEBRIDEMENT OF SKIN & SOFT TISSUE; DRESSING CHANGE UNDER ANESTHESIA;  Surgeon: Atilano Ina, MD,FACS;  Location: [facility] OR;  Service: General;  Laterality: Left;   INCISION AND DRAINAGE PERIRECTAL ABSCESS Left 07/16/2012   Procedure: I&D Left Thigh;  Surgeon: Cherylynn Ridges, MD;  Location: [facility] OR;  Service: General;   Laterality: Left;   INCISION AND DRAINAGE PERIRECTAL ABSCESS N/A 01/05/2015   Procedure: IRRIGATION AND DEBRIDEMENT PERIRECTAL ABSCESS;  Surgeon: Manus Rudd, MD;  Location: [facility] OR;  Service: General;  Laterality: N/A;   INSERTION OF DIALYSIS CATHETER Right 01/05/2021   Procedure: INSERTION OF DIALYSIS CATHETER;  Surgeon: Chuck Hint, MD;  Location: [facility] OR;  Service: Vascular;  Laterality: Right;   IRRIGATION AND DEBRIDEMENT ABSCESS Left 07/06/2012   Procedure: IRRIGATION AND DEBRIDEMENT ABSCESS BUTTOCKS AND THIGH;  Surgeon: Kandis Cocking, MD;  Location: [facility] OR;  Service: General;  Laterality: Left;   IRRIGATION AND DEBRIDEMENT ABSCESS Left 08/10/2012   Procedure: IRRIGATION AND DEBRIDEMENT ABSCESS;  Surgeon: Lodema Pilot, DO;  Location: [facility] OR;  Service: General;  Laterality: Left;   MEMBRANE PEEL Left 07/24/2022  Procedure: MEMBRANE PEEL;  Surgeon: Carmela Rima, MD;  Location: [facility] OR;  Service: Ophthalmology;  Laterality: Left;   PARS PLANA VITRECTOMY Right 12/31/2019   Procedure: PARS PLANA VITRECTOMY WITH 25 GAUGE;  Surgeon: Carmela Rima, MD;  Location: [facility] OR;  Service: Ophthalmology;  Laterality: Right;   PARS PLANA VITRECTOMY Left 07/24/2022   Procedure: PARS PLANA VITRECTOMY WITH 25 GAUGE;  Surgeon: Carmela Rima, MD;  Location: [facility] OR;  Service: Ophthalmology;  Laterality: Left;   PERIPHERAL VASCULAR INTERVENTION Left 05/12/2021   Procedure: PERIPHERAL VASCULAR INTERVENTION;  Surgeon: Victorino Sparrow, MD;  Location: [facility] INVASIVE CV LAB;  Service: Cardiovascular;  Laterality: Left;  arm fistula   PHOTOCOAGULATION Left 07/24/2022   Procedure: AIRGAS, SILICONE OIL, PHOTOCOAGULATION;  Surgeon: Carmela Rima, MD;  Location: [facility] OR;  Service: Ophthalmology;  Laterality: Left;   PHOTOCOAGULATION WITH LASER Right 12/31/2019   Procedure: PHOTOCOAGULATION WITH LASER;  Surgeon: Carmela Rima, MD;  Location: [facility] OR;  Service: Ophthalmology;  Laterality: Right;   REPAIR OF COMPLEX  TRACTION RETINAL DETACHMENT Right 12/31/2019   Procedure: REPAIR OF COMPLEX TRACTION RETINAL;  Surgeon: Carmela Rima, MD;  Location: [facility] OR;  Service: Ophthalmology;  Laterality: Right;   REPAIR OF COMPLEX TRACTION RETINAL DETACHMENT Left 07/24/2022   Procedure: REPAIR OF COMPLEX TRACTION RETINAL DETACHMENT;  Surgeon: Carmela Rima, MD;  Location: [facility] OR;  Service: Ophthalmology;  Laterality: Left;   STUMP REVISION Left 06/09/2017   Procedure: REVISION LEFT BELOW KNEE AMPUTATION;  Surgeon: Nadara Mustard, MD;  Location: [facility] OR;  Service: Orthopedics;  Laterality: Left;   STUMP REVISION Left 09/04/2019   Procedure: LEFT BELOW KNEE AMPUTATION REVISION;  Surgeon: Nadara Mustard, MD;  Location: [facility] OR;  Service: Orthopedics;  Laterality: Left;   STUMP REVISION Left 09/20/2019   Procedure: LEFT BELOW KNEE AMPUTATION REVISION;  Surgeon: Nadara Mustard, MD;  Location: [facility] OR;  Service: Orthopedics;  Laterality: Left;   TONSILLECTOMY  1994       Inpatient Medications: Scheduled Meds:  acetaminophen  1,000 mg Oral Q6H   aspirin  325 mg Oral Daily    HYDROmorphone (DILAUDID) injection  1 mg Intravenous Once   insulin glargine-yfgn  5 Units Subcutaneous QHS   metoprolol succinate  25 mg Oral Daily   pantoprazole (PROTONIX) IV  40 mg Intravenous Q24H   Continuous Infusions:  heparin 1,250 Units/hr (10/31/22 0155)   nitroGLYCERIN     PRN Meds: nitroGLYCERIN  Allergies:    Allergies  Allergen Reactions   Vancomycin Other (See Comments)    Acute renal failure suspected secondary to vanco    Social History:   Social History   Socioeconomic History   Marital status: Single    Spouse name: Not on file   Number of children: 0   Years of education: 11   Highest education level: Not on file  Occupational History   Occupation: disability  Tobacco Use   Smoking status: Former    Packs/day: 0.50    Years: 16.00    Additional pack years: 0.00    Total pack years: 8.00    Types:  Cigarettes, E-cigarettes    Quit date: 08/2021    Years since quitting: 1.1   Smokeless tobacco: Never  Vaping Use   Vaping Use: Some days   Substances: Nicotine, THC, CBD, Flavoring   Devices: CBD oil  Substance and Sexual Activity   Alcohol use: Yes    Alcohol/week: 0.0 standard drinks of alcohol    Comment: social   Drug use:  Not Currently    Types: Marijuana   Sexual activity: Yes    Birth control/protection: Condom  Other Topics Concern   Not on file  Social History Narrative   Lives with mother currently, goddaughter and uncle in a one story home.     On disability for diabetes, neuropathy, sleep apnea etc.  Disability since 2014.    Education: 11th grade.    Social Determinants of Health   Financial Resource Strain: High Risk (12/06/2021)   Overall Financial Resource Strain (CARDIA)    Difficulty of Paying Living Expenses: Hard  Food Insecurity: Food Insecurity Present (12/06/2021)   Hunger Vital Sign    Worried About Running Out of Food in the Last Year: Sometimes true    Ran Out of Food in the Last Year: Sometimes true  Transportation Needs: No Transportation Needs (12/10/2021)   PRAPARE - Administrator, Civil Service (Medical): No    Lack of Transportation (Non-Medical): No  Recent Concern: Transportation Needs - Unmet Transportation Needs (12/06/2021)   PRAPARE - Transportation    Lack of Transportation (Medical): Yes    Lack of Transportation (Non-Medical): Yes  Physical Activity: Not on file  Stress: Stress Concern Present (12/06/2021)   [employer] of Occupational Health - Occupational Stress Questionnaire    Feeling of Stress : Rather much  Social Connections: Moderately Integrated (12/06/2021)   Social Connection and Isolation Panel [NHANES]    Frequency of Communication with Friends and Family: More than three times a week    Frequency of Social Gatherings with Friends and Family: Once a week    Attends Religious Services: 1 to 4 times per  year    Active Member of [employer] or Organizations: No    Attends Banker Meetings: Never    Marital Status: Living with partner  Intimate Partner Violence: Not At Risk (12/06/2021)   Humiliation, Afraid, Rape, and Kick questionnaire    Fear of Current or Ex-Partner: No    Emotionally Abused: No    Physically Abused: No    Sexually Abused: No    Family History:   Family History  Problem Relation Age of Onset   Heart failure Mother    Diabetes Mother    Kidney disease Mother    Kidney disease Father    Diabetes Father    Diabetes Paternal Grandmother    Heart failure Paternal Grandmother    Other Other      ROS:  Please see the history of present illness.  All other ROS reviewed and negative.     Physical Exam/Data:   Vitals:   10/31/22 0215 10/31/22 0235 10/31/22 0252 10/31/22 0320  BP: (!) 189/137 120/83 118/82 128/87  Pulse: (!) 109 (!) 114 (!) 108 (!) 110  Resp: 19 (!) 29 (!) 23 15  Temp:      TempSrc:      SpO2: 100% 100% 100% 100%  Weight:      Height:       No intake or output data in the 24 hours ending 10/31/22 0323    10/30/2022   11:23 AM 09/02/2022    8:51 AM 07/24/2022    7:29 AM  Last 3 Weights  Weight (lbs) 289 lb 289 lb 12.8 oz 284 lb 2.8 oz  Weight (kg) 131.09 kg 131.452 kg 128.9 kg     Body mass index is 43.94 kg/m.  General:  Well nourished, well developed, in no acute distress HEENT: normal Neck: no JVD Vascular: No carotid  bruits; Distal pulses 2+ bilaterally Cardiac:  normal S1, S2; RRR; no murmur  Lungs: Poor airflow Abd: soft, nontender, no hepatomegaly  Ext: no edema Musculoskeletal:  No deformities, BUE and BLE strength normal and equal Skin: warm and dry  Neuro:  CNs 2-12 intact, no focal abnormalities noted Psych:  Normal affect   EKG:  The EKG was personally reviewed and demonstrates: no ST elevation. Prominent T waves  Laboratory Data:  High Sensitivity Troponin:   Recent Labs  Lab 10/30/22 1204 10/30/22 1607  10/30/22 2348  TROPONINIHS 8 67* 445*     Chemistry Recent Labs  Lab 10/30/22 1204  NA 135  K 4.1  CL 95*  CO2 25  GLUCOSE 191*  BUN 23*  CREATININE 6.23*  CALCIUM 10.1  GFRNONAA 8*  ANIONGAP 15    Recent Labs  Lab 10/30/22 1204  PROT 8.7*  ALBUMIN 4.1  AST 21  ALT 18  ALKPHOS 150*  BILITOT 0.9   Lipids No results for input(s): "CHOL", "TRIG", "HDL", "LABVLDL", "LDLCALC", "CHOLHDL" in the last 168 hours.  Hematology Recent Labs  Lab 10/30/22 1204  WBC 6.4  RBC 4.60  HGB 13.1  HCT 40.9  MCV 88.9  MCH 28.5  MCHC 32.0  RDW 13.3  PLT 166   Thyroid No results for input(s): "TSH", "FREET4" in the last 168 hours.  BNPNo results for input(s): "BNP", "PROBNP" in the last 168 hours.  DDimer No results for input(s): "DDIMER" in the last 168 hours.   Radiology/Studies:  CT ABDOMEN PELVIS WO CONTRAST  Result Date: 10/30/2022 CLINICAL DATA:  Abdominal pain. EXAM: CT ABDOMEN AND PELVIS WITHOUT CONTRAST TECHNIQUE: Multidetector CT imaging of the abdomen and pelvis was performed following the standard protocol without IV contrast. RADIATION DOSE REDUCTION: This exam was performed according to the departmental dose-optimization program which includes automated exposure control, adjustment of the mA and/or kV according to patient size and/or use of iterative reconstruction technique. COMPARISON:  December 31, 2020 FINDINGS: Lower chest: Moderate-severity atelectatic changes are seen within the bilateral lung bases. Hepatobiliary: No focal liver abnormality is seen. Numerous tiny gallstones are seen within the lumen of a mildly distended gallbladder. There is no evidence of gallbladder wall thickening, pericholecystic inflammation or biliary dilatation. Pancreas: Unremarkable. No pancreatic ductal dilatation or surrounding inflammatory changes. Spleen: Normal in size without focal abnormality. Adrenals/Urinary Tract: Adrenal glands are unremarkable. The kidneys are slightly small in  size, without renal calculi or hydronephrosis. A 1.8 cm cyst is seen within the mid right kidney. A 7 mm lipoma is seen within the posterior aspect of the mid left kidney. Bladder is unremarkable. Stomach/Bowel: Stomach is within normal limits. Appendix appears normal. No evidence of bowel wall thickening, distention, or inflammatory changes. Small, noninflamed diverticula are seen within the mid to distal sigmoid colon. Vascular/Lymphatic: Aortic atherosclerosis. No enlarged abdominal or pelvic lymph nodes. Reproductive: Uterus and bilateral adnexa are unremarkable. Other: There is a small, fat containing umbilical hernia. No abdominopelvic ascites. Musculoskeletal: No acute or significant osseous findings. IMPRESSION: 1. Cholelithiasis without evidence of acute cholecystitis. 2. Sigmoid diverticulosis. 3. Small, fat containing umbilical hernia. 4. Small right renal cyst and left renal lipoma. No follow-up imaging is recommended. This recommendation follows ACR consensus guidelines: Management of the Incidental Renal Mass on CT: A White Paper of the ACR Incidental Findings Committee. J Am Coll Radiol [phone]. 5. Moderate severity bibasilar atelectasis. 6. Aortic atherosclerosis. Aortic Atherosclerosis (ICD10-I70.0). Electronically Signed   By: Aram Candela M.D.   On: 10/30/2022 18:23  DG Chest 2 View  Result Date: 10/30/2022 CLINICAL DATA:  Upper abdominal pain. EXAM: CHEST - 2 VIEW COMPARISON:  01/05/2021 FINDINGS: Stable scarring right mid lung. No focal pneumonia or substantial pleural effusion. Mild vascular congestion evident. The cardiopericardial silhouette is within normal limits for size. Vascular stent device overlies the left subclavian region. IMPRESSION: Vascular congestion. Component of underlying mild interstitial edema not excluded. Electronically Signed   By: Kennith Center M.D.   On: 10/30/2022 12:59     Assessment and Plan:   # NSTEMI # ESRD  Patient presenting with  atypical chest pain. However has significant risk factors With sharp delta in troponin, will treat as NSTEMI Start IV Heparin Aspirin load 325 mg followed by 81 mg daily Atorvastatin 80 mg LHC in AM Echo in AM BP management as per primary Continue home amlodipine and metoprolol Okay to do NTG Telemetry NPO at midnight   For questions or updates, please contact [facility] Please consult [email or web address] for contact info under    Signed, Hermelinda Dellen, MD  10/31/2022 3:23 AM

## 2022-10-31 NOTE — Consult Note (Signed)
Renal Service Consult Note [facility]  [name] [name] 10/31/2022 Maree Krabbe, MD Requesting Physician: Dr. Heide Spark  Reason for Consult: ESRD pt w/ nstemi HPI: The patient is a 47 y.o. year-old w/ PMH as below who presented to ED yesterday w/ chest pain assoc w/ n/v and diaphoresis. In ED pt had rising troponins up to 649 overnight. Pt admitted for probable NSTEMI. She rec'd sl ntg and was admitted. ECHO showed normal EF, no wma's. IV heparin was started. Today she underwent LHC which showed minor stenoses of 20% or less in the coronaries. Per cardiology most likely had demand ischemia related to her severe stage II HTN. We are asked to see for dialysis.   Pt feeling better today, pt seen in room. Pt       ROS - denies CP, no joint pain, no HA, no blurry vision, no rash, no diarrhea, no nausea/ vomiting, no dysuria, no difficulty voiding   Past Medical History  Past Medical History:  Diagnosis Date   Abdominal muscle pain 09/08/2016   Abnormal Pap smear of cervix 2009   Abscess    history of multiple abscesses   Acute bilateral low back pain 02/13/2017   Acute blood loss anemia    Hx  Iron infusions   Acute on chronic renal failure (HCC) 07/12/2012   Acute renal failure (HCC) 07/12/2012   Acute respiratory failure with hypoxia (HCC) 10/21/2019   Adjustment disorder with depressed mood 03/26/2017   AKI (acute kidney injury) (HCC)    Dialysis t-th-s   Anemia 10/21/2019   Anemia of chronic disease 2002   Anginal pain (HCC)    Anxiety    Panic attacks   Bilateral lower extremity edema 05/13/2016   Bipolar disorder (HCC)    Cataract    B/L cataract   Cellulitis 05/21/2014   right eye   CHF (congestive heart failure) (HCC)    Chronic bronchitis (HCC)    "get it q yr" (05/13/2013)   Chronic pain    Chronic pain of right knee 09/08/2016   Crutches as ambulation aid    uses forearm crutchs, Left  AKA with prosthetic.   Dehiscence of amputation  stump (HCC)    Depression    Edema of lower extremity    Endocarditis 2002   subacute bacterial endocarditis.    Fall    Family history of anesthesia complication    "my mom has a hard time coming out from under"   Fibromyalgia    GERD (gastroesophageal reflux disease)    occ   Heart murmur    never has had any problems   Herpes simplex type 1 infection 01/16/2018   History of blood transfusion    "just low blood count" (05/13/2013)   History of sexually transmitted infection    Hyperlipidemia    Hypertension    Hypothyroidism    Hypothyroidism, adult 03/21/2014   Hypoxia    Leukocytosis    Muscle spasm 12/16/2020   Necrosis (HCC)    and ulceration   Necrotizing fasciitis s/p OR debridements 07/06/2012   Obesity    OSA on CPAP    does not use CPAP   Peripheral neuropathy    Pneumonia    Pyelonephritis 12/31/2020   Routine adult health maintenance 12/08/2016   Severe protein-calorie malnutrition (HCC)    Status post below knee amputation, left (HCC) 04/11/2017   Type II diabetes mellitus (HCC)    Type  II   Vaginal irritation 03/14/2016   Weak pulse  07/21/2021   Wound dehiscence 09/04/2019   Past Surgical History  Past Surgical History:  Procedure Laterality Date   A/V FISTULAGRAM Left 05/12/2021   Procedure: A/V FISTULAGRAM;  Surgeon: Victorino Sparrow, MD;  Location: [facility] INVASIVE CV LAB;  Service: Cardiovascular;  Laterality: Left;   ABDOMINAL AORTOGRAM W/LOWER EXTREMITY Left 03/19/2019   Procedure: ABDOMINAL AORTOGRAM W/LOWER EXTREMITY;  Surgeon: Nada Libman, MD;  Location: [facility] INVASIVE CV LAB;  Service: Cardiovascular;  Laterality: Left;   ABOVE KNEE LEG AMPUTATION Left 10/11/2019   wound dehisence    AIR/FLUID EXCHANGE Right 12/31/2019   Procedure: AIR/FLUID EXCHANGE;  Surgeon: Carmela Rima, MD;  Location: [facility] OR;  Service: Ophthalmology;  Laterality: Right;   AMPUTATION Left 04/07/2017   Procedure: LEFT BELOW KNEE AMPUTATION;  Surgeon: Nadara Mustard,  MD;  Location: [facility] OR;  Service: Orthopedics;  Laterality: Left;   AMPUTATION Left 10/11/2019   Procedure: LEFT ABOVE KNEE AMPUTATION;  Surgeon: Nadara Mustard, MD;  Location: [facility] OR;  Service: Orthopedics;  Laterality: Left;   AV FISTULA PLACEMENT Left 01/05/2021   Procedure: ARTERIOVENOUS (AV) FISTULA CREATION vs. GRAFT;  Surgeon: Chuck Hint, MD;  Location: [facility] OR;  Service: Vascular;  Laterality: Left;   CATARACT EXTRACTION W/PHACO Right 02/11/2020   Procedure: CATARACT EXTRACTION PHACO AND INTRAOCULAR LENS PLACEMENT (IOC);  Surgeon: Carmela Rima, MD;  Location: [facility] OR;  Service: Ophthalmology;  Laterality: Right;   EYE SURGERY     lazer   FISTULA SUPERFICIALIZATION Left 02/23/2021   Procedure: SUPERFICIALIZATION OF LEFT BRACHIOCEPHALIC FISTULA;  Surgeon: Chuck Hint, MD;  Location: [facility] OR;  Service: Vascular;  Laterality: Left;   GAS INSERTION Right 12/31/2019   Procedure: INSERTION OF GAS;  Surgeon: Carmela Rima, MD;  Location: [facility] OR;  Service: Ophthalmology;  Laterality: Right;   INCISION AND DRAINAGE ABSCESS     multiple I&Ds   INCISION AND DRAINAGE ABSCESS Left 07/09/2012   Procedure: DRESSING CHANGE, THIGH WOUND;  Surgeon: Shelly Rubenstein, MD;  Location: [facility] OR;  Service: General;  Laterality: Left;   INCISION AND DRAINAGE OF WOUND Left 07/07/2012   Procedure: IRRIGATION AND DEBRIDEMENT WOUND;  Surgeon: Shelly Rubenstein, MD;  Location: [facility] OR;  Service: General;  Laterality: Left;   INCISION AND DRAINAGE PERIRECTAL ABSCESS Left 07/14/2012   Procedure: DEBRIDEMENT OF SKIN & SOFT TISSUE; DRESSING CHANGE UNDER ANESTHESIA;  Surgeon: Atilano Ina, MD,FACS;  Location: [facility] OR;  Service: General;  Laterality: Left;   INCISION AND DRAINAGE PERIRECTAL ABSCESS Left 07/16/2012   Procedure: I&D Left Thigh;  Surgeon: Cherylynn Ridges, MD;  Location: [facility] OR;  Service: General;  Laterality: Left;   INCISION AND DRAINAGE PERIRECTAL ABSCESS N/A 01/05/2015   Procedure: IRRIGATION  AND DEBRIDEMENT PERIRECTAL ABSCESS;  Surgeon: Manus Rudd, MD;  Location: [facility] OR;  Service: General;  Laterality: N/A;   INSERTION OF DIALYSIS CATHETER Right 01/05/2021   Procedure: INSERTION OF DIALYSIS CATHETER;  Surgeon: Chuck Hint, MD;  Location: [facility] OR;  Service: Vascular;  Laterality: Right;   IRRIGATION AND DEBRIDEMENT ABSCESS Left 07/06/2012   Procedure: IRRIGATION AND DEBRIDEMENT ABSCESS BUTTOCKS AND THIGH;  Surgeon: Kandis Cocking, MD;  Location: [facility] OR;  Service: General;  Laterality: Left;   IRRIGATION AND DEBRIDEMENT ABSCESS Left 08/10/2012   Procedure: IRRIGATION AND DEBRIDEMENT ABSCESS;  Surgeon: Lodema Pilot, DO;  Location: [facility] OR;  Service: General;  Laterality: Left;   MEMBRANE PEEL Left 07/24/2022   Procedure: MEMBRANE PEEL;  Surgeon: Carmela Rima, MD;  Location: [facility] OR;  Service: Ophthalmology;  Laterality: Left;   PARS PLANA VITRECTOMY Right 12/31/2019   Procedure: PARS PLANA VITRECTOMY WITH 25 GAUGE;  Surgeon: Carmela Rima, MD;  Location: [facility] OR;  Service: Ophthalmology;  Laterality: Right;   PARS PLANA VITRECTOMY Left 07/24/2022   Procedure: PARS PLANA VITRECTOMY WITH 25 GAUGE;  Surgeon: Carmela Rima, MD;  Location: [facility] OR;  Service: Ophthalmology;  Laterality: Left;   PERIPHERAL VASCULAR INTERVENTION Left 05/12/2021   Procedure: PERIPHERAL VASCULAR INTERVENTION;  Surgeon: Victorino Sparrow, MD;  Location: [facility] INVASIVE CV LAB;  Service: Cardiovascular;  Laterality: Left;  arm fistula   PHOTOCOAGULATION Left 07/24/2022   Procedure: AIRGAS, SILICONE OIL, PHOTOCOAGULATION;  Surgeon: Carmela Rima, MD;  Location: [facility] OR;  Service: Ophthalmology;  Laterality: Left;   PHOTOCOAGULATION WITH LASER Right 12/31/2019   Procedure: PHOTOCOAGULATION WITH LASER;  Surgeon: Carmela Rima, MD;  Location: [facility] OR;  Service: Ophthalmology;  Laterality: Right;   REPAIR OF COMPLEX TRACTION RETINAL DETACHMENT Right 12/31/2019   Procedure: REPAIR OF COMPLEX TRACTION RETINAL;  Surgeon:  Carmela Rima, MD;  Location: [facility] OR;  Service: Ophthalmology;  Laterality: Right;   REPAIR OF COMPLEX TRACTION RETINAL DETACHMENT Left 07/24/2022   Procedure: REPAIR OF COMPLEX TRACTION RETINAL DETACHMENT;  Surgeon: Carmela Rima, MD;  Location: [facility] OR;  Service: Ophthalmology;  Laterality: Left;   STUMP REVISION Left 06/09/2017   Procedure: REVISION LEFT BELOW KNEE AMPUTATION;  Surgeon: Nadara Mustard, MD;  Location: [facility] OR;  Service: Orthopedics;  Laterality: Left;   STUMP REVISION Left 09/04/2019   Procedure: LEFT BELOW KNEE AMPUTATION REVISION;  Surgeon: Nadara Mustard, MD;  Location: [facility] OR;  Service: Orthopedics;  Laterality: Left;   STUMP REVISION Left 09/20/2019   Procedure: LEFT BELOW KNEE AMPUTATION REVISION;  Surgeon: Nadara Mustard, MD;  Location: [facility] OR;  Service: Orthopedics;  Laterality: Left;   TONSILLECTOMY  1994   Family History  Family History  Problem Relation Age of Onset   Heart failure Mother    Diabetes Mother    Kidney disease Mother    Kidney disease Father    Diabetes Father    Diabetes Paternal Grandmother    Heart failure Paternal Grandmother    Other Other    Social History  reports that she quit smoking about 14 months ago. Her smoking use included cigarettes and e-cigarettes. She has a 8.00 pack-year smoking history. She has never used smokeless tobacco. She reports current alcohol use. She reports that she does not currently use drugs after having used the following drugs: Marijuana. Allergies  Allergies  Allergen Reactions   Vancomycin Other (See Comments)    Acute renal failure suspected secondary to vanco   Home medications Prior to Admission medications   Medication Sig Start Date End Date Taking? Authorizing Provider  allopurinol (ZYLOPRIM) 100 MG tablet Take 1 tablet by mouth 3 times a week. Take only after dialysis sessions. Do not take on non-dialysis days. Patient taking differently: Take 100 mg by mouth 3 (three) times a week. Take only after  dialysis sessions. Do not take on non-dialysis days. 09/12/22  Yes Katsadouros, Vasilios, MD  amitriptyline (ELAVIL) 100 MG tablet Take 100 mg by mouth at bedtime.   Yes [provider]  amLODipine (NORVASC) 10 MG tablet TAKE 1 TABLET BY MOUTH EVERY DAY 05/19/22  Yes Katsadouros, Vasilios, MD  atorvastatin (LIPITOR) 20 MG tablet Take 1 tablet (20 mg total) by mouth daily. 07/21/22  Yes Katsadouros, Vasilios, MD  Benzoyl Peroxide 2.5 % CREA Apply 1 drop topically  daily. 07/21/22  Yes Katsadouros, Vasilios, MD  buPROPion (WELLBUTRIN) 100 MG tablet Take 1 tablet (100 mg total) by mouth every other day. 07/21/22  Yes Katsadouros, Vasilios, MD  cyclobenzaprine (FLEXERIL) 5 MG tablet Take 1 tablet by mouth daily as needed for muscle spasms. 10/07/21  Yes Katsadouros, Vasilios, MD  diclofenac Sodium (VOLTAREN) 1 % GEL APPLY 1 GRAM TOPICALLY TO AFFECTED AREA 4 TIMES DAILY AS NEEDED Patient taking differently: Apply 1 g topically 4 (four) times daily as needed (for pain). 02/02/22  Yes Katsadouros, Vasilios, MD  fluticasone (FLONASE) 50 MCG/ACT nasal spray Place 1 spray into both nostrils daily. Patient taking differently: Place 1 spray into both nostrils daily as needed for allergies. 07/21/22 07/21/23 Yes Katsadouros, Vasilios, MD  furosemide (LASIX) 20 MG tablet Take 1 tablet by mouth daily. Take on non-dialysis days. Adjust per kidney docs Patient taking differently: Take 20 mg by mouth daily. Take on non-dialysis days. Adjust per kidney docs 03/03/22  Yes Katsadouros, Heidi Dach, MD  hydrocortisone 2.5 % lotion Apply to affected area 2 times daily for 2-4 weeks 12/06/21 12/06/22 Yes Katsadouros, Vasilios, MD  hydrOXYzine (ATARAX) 25 MG tablet TAKE 1/2 TABLET BY MOUTH EVERY 6 HOURS AS NEEDED FOR ANXIETY Patient taking differently: Take 12.5 mg by mouth every 6 (six) hours as needed for anxiety. 07/05/22  Yes Katsadouros, Vasilios, MD  ketoconazole (NIZORAL) 2 % cream Apply 1 application topically to affected  area daily. 09/23/21  Yes Katsadouros, Vasilios, MD  levothyroxine (SYNTHROID) 88 MCG tablet Take 88 mcg by mouth daily. 12/08/20  Yes [provider]  metoprolol succinate (TOPROL-XL) 25 MG 24 hr tablet Take 1 tablet (25 mg total) by mouth daily. 07/21/22  Yes Katsadouros, Vasilios, MD  ofloxacin (OCUFLOX) 0.3 % ophthalmic solution Place 1 drop into the left eye in the morning and at bedtime. 07/28/22  Yes [provider]  ondansetron (ZOFRAN) 8 MG tablet Take 8 mg by mouth every 8 (eight) hours as needed for nausea. 04/19/22  Yes [provider]  oxyCODONE-acetaminophen (PERCOCET) 7.5-325 MG tablet Take 1 tablet by mouth every 8 (eight) hours as needed for moderate pain. 07/13/22  Yes [provider]  sevelamer carbonate (RENVELA) 800 MG tablet Take 6 to 7 tablets by mouth twice a day with meals 01/26/21  Yes [provider]  tirzepatide Greggory Keen) 10 MG/0.5ML Pen Inject 5 mg into the skin once a week. Takes on Saturday. 09/12/22  Yes Katsadouros, Vasilios, MD  tretinoin (RETIN-A) 0.025 % cream Apply topically at bedtime. 07/20/22  Yes Katsadouros, Vasilios, MD  blood glucose meter kit and supplies KIT ICD 10- E11.69. Based on patient and insurance preference. Use up to four times daily as directed. 05/01/18   Love, Evlyn Kanner, PA-C  Insulin Pen Needle (TRUEPLUS PEN NEEDLES) 31G X 5 MM MISC 1 each by Other route 3 (three) times daily. 09/23/21   Belva Agee, MD     Vitals:   10/31/22 1402 10/31/22 1407 10/31/22 1428 10/31/22 1615  BP: 132/75 132/75 122/81 (!) 151/77  Pulse: 88 (!) 0 89 93  Resp: 17  15 11   Temp:   98.5 F (36.9 C) 97.8 F (36.6 C)  TempSrc:   Oral Oral  SpO2: 98%  100% 99%  Weight:      Height:       Exam Gen alert, no distress, pleasant No rash, cyanosis or gangrene Sclera anicteric, throat clear  No jvd or bruits Chest clear bilat to bases, no rales/ wheezing RRR no RG Abd  soft ntnd no mass or ascites +bs GU defer MS L  AKA  Ext no LE or UE edema Neuro is alert, Ox 3 , nf    LUA AVF+Bruit      Home meds include - zyloprim, elavil, wellbutrin, lasix 20 non hd days, percocet, renvela 6-7 tabs bid ac, mounjaro, norvasc 10, lipitor, synthroid, toprol xl 25 qd, prns/ vits/ supps     OP HD: [facility]  4h  400/800  120.7kg  2/2 bath  AVF  Heparin 2000 - last OP HD 6/08, post wt 120.8 - hectorol 6 mcg IV TTS - mircera 30 mcg IV q 2 wks, last 6/08, due 6/22   Assessment/ Plan: Chest pain - LHC neg for sig CAD, most likely demand ischemia per cardiology.  HTN - cont home meds.  Volume - looks euvolemic on exam, LVEDP 19 which is borderline high. Dry wt was recently lowered, taking Mounjaro. Probably can lower dry wt again given Mounjaro rx.  ESRD - on HD TTS. HD tomorrow 1st shift.  Anemia esrd - Hb 13, no esa needs MBD ckd - CCa and phos are in range. Cont binders.  DM2 - per pmd      Vinson Moselle  MD [facility] 10/31/2022, 5:00 PM  Recent Labs  Lab 10/30/22 1204 10/31/22 0229  HGB 13.1  --   ALBUMIN 4.1 3.6  CALCIUM 10.1 10.0  PHOS  --  4.8*  CREATININE 6.23* 7.31*  K 4.1 4.5   Inpatient medications:  acetaminophen  1,000 mg Oral Q6H   aspirin  81 mg Oral Daily   atorvastatin  80 mg Oral Daily   heparin injection (subcutaneous)  5,000 Units Subcutaneous Q8H   insulin aspart  0-5 Units Subcutaneous QHS   insulin aspart  0-9 Units Subcutaneous TID WC   insulin glargine-yfgn  5 Units Subcutaneous QHS   metoprolol succinate  25 mg Oral Daily   pantoprazole (PROTONIX) IV  40 mg Intravenous Q24H   sodium chloride flush  3 mL Intravenous Q12H   sodium chloride flush  3 mL Intravenous Q12H   Vitamin D (Ergocalciferol)  50,000 Units Oral Q7 days    sodium chloride     sodium chloride, acetaminophen, hydrALAZINE, labetalol, nitroGLYCERIN, ondansetron (ZOFRAN) IV, sodium chloride flush

## 2022-10-31 NOTE — Progress Notes (Signed)
  Echocardiogram 2D Echocardiogram has been performed.  [name] [name] 10/31/2022, 12:13 PM

## 2022-10-31 NOTE — Progress Notes (Signed)
Rounding Note    Patient Name: [name] [name] Date of Encounter: 10/31/2022  [facility] Cardiologist: Peter Swaziland, MD   Subjective   No more episodes of chest pain. Anxious  Inpatient Medications    Scheduled Meds:  acetaminophen  1,000 mg Oral Q6H   aspirin  325 mg Oral Daily   atorvastatin  80 mg Oral Daily    HYDROmorphone (DILAUDID) injection  1 mg Intravenous Once   insulin glargine-yfgn  5 Units Subcutaneous QHS   metoprolol succinate  25 mg Oral Daily   pantoprazole (PROTONIX) IV  40 mg Intravenous Q24H   Continuous Infusions:  heparin 1,250 Units/hr (10/31/22 0502)   PRN Meds: nitroGLYCERIN   Vital Signs    Vitals:   10/31/22 0400 10/31/22 0458 10/31/22 0744 10/31/22 0830  BP: 114/67 (!) 110/9 112/75 128/82  Pulse: (!) 107 98 94 94  Resp: 19 20 17    Temp:  98.3 F (36.8 C) 98.3 F (36.8 C)   TempSrc:  Oral Oral   SpO2: 100% 100% 97%   Weight:  51.8 kg    Height:  5\' 9"  (1.753 m)      Intake/Output Summary (Last 24 hours) at 10/31/2022 0854 Last data filed at 10/31/2022 0502 Gross per 24 hour  Intake 57.44 ml  Output --  Net 57.44 ml      10/31/2022    4:58 AM 10/30/2022   11:23 AM 09/02/2022    8:51 AM  Last 3 Weights  Weight (lbs) 114 lb 4.2 oz 289 lb 289 lb 12.8 oz  Weight (kg) 51.828 kg 131.09 kg 131.452 kg      Telemetry    NSR HR 90s - Personally Reviewed  ECG    NSR HR 97, new inverted T waves in inferior leads - Personally Reviewed  Physical Exam   GEN: No acute distress, anxious Cardiac: RRR, no murmurs, rubs, or gallops.  Respiratory: Clear to auscultation bilaterally. GI: Soft, nontender, non-distended  MS: No edema; Left BKA Neuro:  Nonfocal  Psych: Normal affect   Labs    High Sensitivity Troponin:   Recent Labs  Lab 10/30/22 1204 10/30/22 1607 10/30/22 2348 10/31/22 0229  TROPONINIHS 8 67* 445* 649*     Chemistry Recent Labs  Lab 10/30/22 1204 10/31/22 0229  NA 135 136  K 4.1 4.5  CL  95* 94*  CO2 25 27  GLUCOSE 191* 206*  BUN 23* 32*  CREATININE 6.23* 7.31*  CALCIUM 10.1 10.0  PROT 8.7* 7.9  ALBUMIN 4.1 3.6  AST 21 18  ALT 18 17  ALKPHOS 150* 139*  BILITOT 0.9 0.9  GFRNONAA 8* 6*  ANIONGAP 15 15     Hematology Recent Labs  Lab 10/30/22 1204  WBC 6.4  RBC 4.60  HGB 13.1  HCT 40.9  MCV 88.9  MCH 28.5  MCHC 32.0  RDW 13.3  PLT 166    Radiology    CT ABDOMEN PELVIS WO CONTRAST  Result Date: 10/30/2022 CLINICAL DATA:  Abdominal pain. EXAM: CT ABDOMEN AND PELVIS WITHOUT CONTRAST TECHNIQUE: Multidetector CT imaging of the abdomen and pelvis was performed following the standard protocol without IV contrast. RADIATION DOSE REDUCTION: This exam was performed according to the departmental dose-optimization program which includes automated exposure control, adjustment of the mA and/or kV according to patient size and/or use of iterative reconstruction technique. COMPARISON:  December 31, 2020 FINDINGS: Lower chest: Moderate-severity atelectatic changes are seen within the bilateral lung bases. Hepatobiliary: No focal liver abnormality is seen.  Numerous tiny gallstones are seen within the lumen of a mildly distended gallbladder. There is no evidence of gallbladder wall thickening, pericholecystic inflammation or biliary dilatation. Pancreas: Unremarkable. No pancreatic ductal dilatation or surrounding inflammatory changes. Spleen: Normal in size without focal abnormality. Adrenals/Urinary Tract: Adrenal glands are unremarkable. The kidneys are slightly small in size, without renal calculi or hydronephrosis. A 1.8 cm cyst is seen within the mid right kidney. A 7 mm lipoma is seen within the posterior aspect of the mid left kidney. Bladder is unremarkable. Stomach/Bowel: Stomach is within normal limits. Appendix appears normal. No evidence of bowel wall thickening, distention, or inflammatory changes. Small, noninflamed diverticula are seen within the mid to distal sigmoid  colon. Vascular/Lymphatic: Aortic atherosclerosis. No enlarged abdominal or pelvic lymph nodes. Reproductive: Uterus and bilateral adnexa are unremarkable. Other: There is a small, fat containing umbilical hernia. No abdominopelvic ascites. Musculoskeletal: No acute or significant osseous findings. IMPRESSION: 1. Cholelithiasis without evidence of acute cholecystitis. 2. Sigmoid diverticulosis. 3. Small, fat containing umbilical hernia. 4. Small right renal cyst and left renal lipoma. No follow-up imaging is recommended. This recommendation follows ACR consensus guidelines: Management of the Incidental Renal Mass on CT: A White Paper of the ACR Incidental Findings Committee. J Am Coll Radiol [phone]. 5. Moderate severity bibasilar atelectasis. 6. Aortic atherosclerosis. Aortic Atherosclerosis (ICD10-I70.0). Electronically Signed   By: Aram Candela M.D.   On: 10/30/2022 18:23   DG Chest 2 View  Result Date: 10/30/2022 CLINICAL DATA:  Upper abdominal pain. EXAM: CHEST - 2 VIEW COMPARISON:  01/05/2021 FINDINGS: Stable scarring right mid lung. No focal pneumonia or substantial pleural effusion. Mild vascular congestion evident. The cardiopericardial silhouette is within normal limits for size. Vascular stent device overlies the left subclavian region. IMPRESSION: Vascular congestion. Component of underlying mild interstitial edema not excluded. Electronically Signed   By: Kennith Center M.D.   On: 10/30/2022 12:59    Cardiac Studies   Chest x-ray 10/30/22 IMPRESSION: Vascular congestion. Component of underlying mild interstitial edema not excluded.  Echo pending today  Patient Profile     47 y.o. female with a hx of ESRD and T2DM who was seen 10/31/2022 for the evaluation of NSTEMI at the request of Dr. Heide Spark   Assessment & Plan    NSTEMI CHEST PAIN -- Troponin 649 this morning, up from 445 -- new inverted T waves in inferior leads this AM -- pt states she only takes her medications  sometimes, but will take any medications she needs to if she gets a stent.  -- Continue IV heparin, toprol 25mg  daily, atorvastain 80mg  daily -- decrease aspirin dose to 81mg  daily -- LHC and echo today   HTN -- very hypertensive, 200s/120s on admission, better this AM  -- continue toprol as above  HLD -- pt noncompliant with meds -- continue atorvastatin as above -- lipids pending  ESRD -- TTS hemodialysis  DM -- CBG elevated overnight but 86 this AM, may need to increase SSI if continues to be uncontrolled.    Shared Decision Making/Informed Consent{ The risks [stroke (1 in 1000), death (1 in 1000), kidney failure [usually temporary] (1 in 500), bleeding (1 in 200), allergic reaction [possibly serious] (1 in 200)], benefits (diagnostic support and management of coronary artery disease) and alternatives of a cardiac catheterization were discussed in detail with Ms. Klunk and she is willing to proceed.    For questions or updates, please contact [facility] Please consult [email or web address] for contact info under  Signed, Osborne Oman, RN Student Nurse Practitioner  10/31/2022, 8:54 AM

## 2022-10-31 NOTE — Progress Notes (Signed)
ANTICOAGULATION CONSULT NOTE - Initial Consult  Pharmacy Consult for heparin Indication: chest pain/ACS - NSTEMI  Allergies  Allergen Reactions   Vancomycin Other (See Comments)    Acute renal failure suspected secondary to vanco    Patient Measurements: Height: 5\' 8"  (172.7 cm) Weight: 131.1 kg (289 lb) IBW/kg (Calculated) : 63.9 Heparin Dosing Weight: 95.2 Kg  Vital Signs: Temp: 98.1 F (36.7 C) (06/09 2335) Temp Source: Oral (06/09 2335) BP: 175/125 (06/09 2345) Pulse Rate: 108 (06/09 2345)  Labs: Recent Labs    10/30/22 1204 10/30/22 1607 10/30/22 2348  HGB 13.1  --   --   HCT 40.9  --   --   PLT 166  --   --   CREATININE 6.23*  --   --   TROPONINIHS 8 67* 445*    Estimated Creatinine Clearance: 16.2 mL/min (A) (by C-G formula based on SCr of 6.23 mg/dL (H)).   Medical History: Past Medical History:  Diagnosis Date   Abdominal muscle pain 09/08/2016   Abnormal Pap smear of cervix 2009   Abscess    history of multiple abscesses   Acute bilateral low back pain 02/13/2017   Acute blood loss anemia    Hx  Iron infusions   Acute on chronic renal failure (HCC) 07/12/2012   Acute renal failure (HCC) 07/12/2012   Acute respiratory failure with hypoxia (HCC) 10/21/2019   Adjustment disorder with depressed mood 03/26/2017   AKI (acute kidney injury) (HCC)    Dialysis t-th-s   Anemia 10/21/2019   Anemia of chronic disease 2002   Anginal pain (HCC)    Anxiety    Panic attacks   Bilateral lower extremity edema 05/13/2016   Bipolar disorder (HCC)    Cataract    B/L cataract   Cellulitis 05/21/2014   right eye   CHF (congestive heart failure) (HCC)    Chronic bronchitis (HCC)    "get it q yr" (05/13/2013)   Chronic pain    Chronic pain of right knee 09/08/2016   Crutches as ambulation aid    uses forearm crutchs, Left  AKA with prosthetic.   Dehiscence of amputation stump (HCC)    Depression    Edema of lower extremity    Endocarditis 2002   subacute  bacterial endocarditis.    Fall    Family history of anesthesia complication    "my mom has a hard time coming out from under"   Fibromyalgia    GERD (gastroesophageal reflux disease)    occ   Heart murmur    never has had any problems   Herpes simplex type 1 infection 01/16/2018   History of blood transfusion    "just low blood count" (05/13/2013)   History of sexually transmitted infection    Hyperlipidemia    Hypertension    Hypothyroidism    Hypothyroidism, adult 03/21/2014   Hypoxia    Leukocytosis    Muscle spasm 12/16/2020   Necrosis (HCC)    and ulceration   Necrotizing fasciitis s/p OR debridements 07/06/2012   Obesity    OSA on CPAP    does not use CPAP   Peripheral neuropathy    Pneumonia    Pyelonephritis 12/31/2020   Routine adult health maintenance 12/08/2016   Severe protein-calorie malnutrition (HCC)    Status post below knee amputation, left (HCC) 04/11/2017   Type II diabetes mellitus (HCC)    Type  II   Vaginal irritation 03/14/2016   Weak pulse 07/21/2021   Wound dehiscence  09/04/2019    Assessment: 74 yoF presenting with chest pain. No oral anticoagulation reported PTA. The patient is ESRD with last HD reported 10/29/22. CBC WNL. Pharmacy consulted to dose heparin infusion.    Goal of Therapy:  Heparin level 0.3-0.7 units/ml Monitor platelets by anticoagulation protocol: Yes   Plan:  Give 2000 units bolus x 1 (reduced bolus due to SQ heparin given last evening) Start heparin infusion at 1250 units/hr Check anti-Xa level in 8 hours and daily while on heparin Continue to monitor H&H and platelets  Ruben Im, PharmD Clinical Pharmacist 10/31/2022 1:40 AM Please check AMION for all [facility] numbers

## 2022-11-01 ENCOUNTER — Encounter (HOSPITAL_COMMUNITY): Payer: Self-pay | Admitting: Cardiovascular Disease

## 2022-11-01 DIAGNOSIS — I120 Hypertensive chronic kidney disease with stage 5 chronic kidney disease or end stage renal disease: Secondary | ICD-10-CM | POA: Diagnosis not present

## 2022-11-01 DIAGNOSIS — N186 End stage renal disease: Secondary | ICD-10-CM | POA: Diagnosis not present

## 2022-11-01 DIAGNOSIS — I2489 Other forms of acute ischemic heart disease: Secondary | ICD-10-CM | POA: Diagnosis not present

## 2022-11-01 DIAGNOSIS — Z992 Dependence on renal dialysis: Secondary | ICD-10-CM

## 2022-11-01 DIAGNOSIS — I214 Non-ST elevation (NSTEMI) myocardial infarction: Secondary | ICD-10-CM | POA: Diagnosis not present

## 2022-11-01 DIAGNOSIS — I10 Essential (primary) hypertension: Secondary | ICD-10-CM | POA: Diagnosis not present

## 2022-11-01 DIAGNOSIS — E1122 Type 2 diabetes mellitus with diabetic chronic kidney disease: Secondary | ICD-10-CM | POA: Diagnosis not present

## 2022-11-01 LAB — RENAL FUNCTION PANEL
Albumin: 3.9 g/dL (ref 3.5–5.0)
Albumin: 3.9 g/dL (ref 3.5–5.0)
Anion gap: 17 — ABNORMAL HIGH (ref 5–15)
Anion gap: 17 — ABNORMAL HIGH (ref 5–15)
BUN: 45 mg/dL — ABNORMAL HIGH (ref 6–20)
BUN: 21 mg/dL — ABNORMAL HIGH (ref 6–20)
CO2: 23 mmol/L (ref 22–32)
CO2: 24 mmol/L (ref 22–32)
Calcium: 9.9 mg/dL (ref 8.9–10.3)
Calcium: 9.8 mg/dL (ref 8.9–10.3)
Chloride: 90 mmol/L — ABNORMAL LOW (ref 98–111)
Chloride: 93 mmol/L — ABNORMAL LOW (ref 98–111)
Creatinine, Ser: 9.4 mg/dL — ABNORMAL HIGH (ref 0.44–1.00)
Creatinine, Ser: 5.83 mg/dL — ABNORMAL HIGH (ref 0.44–1.00)
GFR, Estimated: 5 mL/min — ABNORMAL LOW (ref >60)
GFR, Estimated: 8 mL/min — ABNORMAL LOW (ref >60)
Glucose, Bld: 187 mg/dL — ABNORMAL HIGH (ref 70–99)
Glucose, Bld: 131 mg/dL — ABNORMAL HIGH (ref 70–99)
Phosphorus: 3.6 mg/dL (ref 2.5–4.6)
Phosphorus: 2.8 mg/dL (ref 2.5–4.6)
Potassium: 4.6 mmol/L (ref 3.5–5.1)
Potassium: 4.1 mmol/L (ref 3.5–5.1)
Sodium: 130 mmol/L — ABNORMAL LOW (ref 135–145)
Sodium: 134 mmol/L — ABNORMAL LOW (ref 135–145)

## 2022-11-01 LAB — CBC WITH DIFFERENTIAL/PLATELET
Abs Immature Granulocytes: 0.03 10*3/uL (ref 0.00–0.07)
Basophils Absolute: 0 10*3/uL (ref 0.0–0.1)
Basophils Relative: 0 %
Eosinophils Absolute: 0 10*3/uL (ref 0.0–0.5)
Eosinophils Relative: 0 %
HCT: 38.6 % (ref 36.0–46.0)
Hemoglobin: 13.2 g/dL (ref 12.0–15.0)
Immature Granulocytes: 0 %
Lymphocytes Relative: 21 %
Lymphs Abs: 1.6 10*3/uL (ref 0.7–4.0)
MCH: 29.7 pg (ref 26.0–34.0)
MCHC: 34.2 g/dL (ref 30.0–36.0)
MCV: 86.9 fL (ref 80.0–100.0)
Monocytes Absolute: 0.8 10*3/uL (ref 0.1–1.0)
Monocytes Relative: 10 %
Neutro Abs: 5.4 10*3/uL (ref 1.7–7.7)
Neutrophils Relative %: 69 %
Platelets: 219 10*3/uL (ref 150–400)
RBC: 4.44 MIL/uL (ref 3.87–5.11)
RDW: 13.6 % (ref 11.5–15.5)
WBC: 7.9 10*3/uL (ref 4.0–10.5)
nRBC: 0 % (ref 0.0–0.2)

## 2022-11-01 LAB — GLUCOSE, CAPILLARY
Glucose-Capillary: 178 mg/dL — ABNORMAL HIGH (ref 70–99)
Glucose-Capillary: 127 mg/dL — ABNORMAL HIGH (ref 70–99)
Glucose-Capillary: 108 mg/dL — ABNORMAL HIGH (ref 70–99)
Glucose-Capillary: 118 mg/dL — ABNORMAL HIGH (ref 70–99)
Glucose-Capillary: 116 mg/dL — ABNORMAL HIGH (ref 70–99)

## 2022-11-01 LAB — CBC
HCT: 36.8 % (ref 36.0–46.0)
Hemoglobin: 12 g/dL (ref 12.0–15.0)
MCH: 28.4 pg (ref 26.0–34.0)
MCHC: 32.6 g/dL (ref 30.0–36.0)
MCV: 87.2 fL (ref 80.0–100.0)
Platelets: 213 10*3/uL (ref 150–400)
RBC: 4.22 MIL/uL (ref 3.87–5.11)
RDW: 13.8 % (ref 11.5–15.5)
WBC: 8.3 10*3/uL (ref 4.0–10.5)
nRBC: 0 % (ref 0.0–0.2)

## 2022-11-01 LAB — TROPONIN I (HIGH SENSITIVITY)
Troponin I (High Sensitivity): 205 ng/L (ref <18)
Troponin I (High Sensitivity): 164 ng/L (ref <18)

## 2022-11-01 LAB — HEPATITIS B SURFACE ANTIBODY, QUANTITATIVE: Hep B S AB Quant (Post): 909 m[IU]/mL (ref >9.9)

## 2022-11-01 LAB — TSH: TSH: 3.123 u[IU]/mL (ref 0.350–4.500)

## 2022-11-01 MED ORDER — LABETALOL HCL 5 MG/ML IV SOLN
10.0000 mg | Freq: Once | INTRAVENOUS | Status: AC
  Administered 2022-11-01: 10 mg via INTRAVENOUS
  Filled 2022-11-01: qty 4

## 2022-11-01 MED ORDER — ALUM & MAG HYDROXIDE-SIMETH 200-200-20 MG/5ML PO SUSP
30.0000 mL | Freq: Once | ORAL | Status: AC
  Administered 2022-11-01: 30 mL via ORAL
  Filled 2022-11-01 (×2): qty 30

## 2022-11-01 MED ORDER — HYDROXYZINE HCL 25 MG PO TABS: 25.0000 mg | ORAL_TABLET | Freq: Three times a day (TID) | ORAL | Status: DC | PRN

## 2022-11-01 MED ORDER — LORAZEPAM 2 MG/ML IJ SOLN
0.5000 mg | Freq: Four times a day (QID) | INTRAMUSCULAR | Status: DC | PRN
  Administered 2022-11-01: 0.5 mg via INTRAVENOUS
  Filled 2022-11-01: qty 1

## 2022-11-01 MED ORDER — AMLODIPINE BESYLATE 10 MG PO TABS
10.0000 mg | ORAL_TABLET | Freq: Every day | ORAL | Status: DC
  Filled 2022-11-01: qty 1

## 2022-11-01 MED ORDER — HEPARIN SODIUM (PORCINE) 1000 UNIT/ML IJ SOLN
INTRAMUSCULAR | Status: AC
  Filled 2022-11-01: qty 2

## 2022-11-01 MED ORDER — LORAZEPAM 1 MG PO TABS: 1.0000 mg | ORAL_TABLET | Freq: Once | ORAL | Status: DC

## 2022-11-01 MED ORDER — POLYETHYLENE GLYCOL 3350 17 G PO PACK
17.0000 g | PACK | Freq: Every day | ORAL | Status: DC
  Administered 2022-11-01 – 2022-11-02 (×2): 17 g via ORAL
  Filled 2022-11-01 (×2): qty 1

## 2022-11-01 MED ORDER — LORAZEPAM 2 MG/ML IJ SOLN
0.5000 mg | Freq: Once | INTRAMUSCULAR | Status: AC
  Administered 2022-11-01: 0.5 mg via INTRAVENOUS
  Filled 2022-11-01: qty 1

## 2022-11-01 MED ORDER — NITROGLYCERIN 0.4 MG SL SUBL
0.4000 mg | SUBLINGUAL_TABLET | SUBLINGUAL | Status: DC | PRN
  Administered 2022-11-01 (×3): 0.4 mg via SUBLINGUAL
  Filled 2022-11-01: qty 1

## 2022-11-01 MED ORDER — ONDANSETRON HCL 4 MG/2ML IJ SOLN
4.0000 mg | Freq: Four times a day (QID) | INTRAMUSCULAR | Status: DC | PRN
  Filled 2022-11-01: qty 2

## 2022-11-01 MED ORDER — HEPARIN SODIUM (PORCINE) 1000 UNIT/ML IJ SOLN: 2000.0000 [IU] | Freq: Once | INTRAMUSCULAR | Status: DC

## 2022-11-01 MED ORDER — LIDOCAINE VISCOUS HCL 2 % MT SOLN
15.0000 mL | Freq: Once | OROMUCOSAL | Status: DC
  Filled 2022-11-01: qty 15

## 2022-11-01 MED ORDER — CARVEDILOL 6.25 MG PO TABS
6.2500 mg | ORAL_TABLET | Freq: Two times a day (BID) | ORAL | Status: DC
  Administered 2022-11-01 (×2): 6.25 mg via ORAL
  Filled 2022-11-01 (×3): qty 1

## 2022-11-01 MED FILL — Verapamil HCl IV Soln 2.5 MG/ML: INTRAVENOUS | Qty: 2 | Status: AC

## 2022-11-01 NOTE — Progress Notes (Signed)
   10/31/22 2300  BiPAP/CPAP/SIPAP  BiPAP/CPAP/SIPAP Pt Type Adult (Does not use; last doc 2/14 @+8)

## 2022-11-01 NOTE — Progress Notes (Signed)
Patient attempted to have a bowel movement. Bowel movement was unsuccessful

## 2022-11-01 NOTE — Progress Notes (Signed)
Patient BP is 119/116. patient continue to be very weak. Patient is nauseous but is no longer clammy.

## 2022-11-01 NOTE — Progress Notes (Signed)
[facility] Progress Note  Subjective: had HD overnight , came off early am today. 2.9 L UF.   Vitals:   11/01/22 0930 11/01/22 0955 11/01/22 1000 11/01/22 1035  BP: (!) 173/106 (!) 162/98 (!) 162/93 111/61  Pulse: 99 95 98   Resp: 14 (!) 22 17   Temp:   98.4 F (36.9 C)   TempSrc:      SpO2: 97% 99% 100%   Weight:      Height:        Exam: Gen alert, no distress, pleasant No jvd or bruits Chest clear bilat to bases RRR no RG Abd soft ntnd no mass or ascites +bs GU defer MS L AKA  Ext no LE or UE edema Neuro is alert, Ox 3 , nf    LUA AVF+Bruit     Home meds include - zyloprim, elavil, wellbutrin, lasix 20 non hd days, percocet, renvela 6-7 tabs bid ac, mounjaro, norvasc 10, lipitor, synthroid, toprol xl 25 qd, prns/ vits/ supps        OP HD: [facility]  4h  400/800  120.7kg  2/2 bath  AVF  Heparin 2000 - last OP HD 6/08, post wt 120.8 - hectorol 6 mcg IV TTS - mircera 30 mcg IV q 2 wks, last 6/08, due 6/22     Assessment/ Plan: Chest pain - LHC neg for sig CAD, most likely demand ischemia per cardiology.  HTN - cont home meds.  Volume - looks euvolemic on exam, LVEDP was 19 which is borderline high. Dry wt was recently lowered, taking Mounjaro. Tolerated HD overnight, 2.9 L off and was at her dry wt pre HD. So may need another drop in her dry wt. Will get standing wts in the morning tomorrow.  ESRD - on HD TTS. Had HD this am. Next HD Thursday if still here.  Anemia esrd - Hb 13, no esa needs MBD ckd - CCa and phos are in range. Cont binders.  DM2 - per pmd     Vinson Moselle MD [facility] 11/01/2022, 11:51 AM  Recent Labs  Lab 10/30/22 1204 10/31/22 0229 11/01/22 0358 11/01/22 0359  HGB 13.1  --  12.0  --   ALBUMIN 4.1 3.6  --  3.9  CALCIUM 10.1 10.0  --  9.9  PHOS  --  4.8*  --  3.6  CREATININE 6.23* 7.31*  --  9.40*  K 4.1 4.5  --  4.6   No results for input(s): "IRON", "TIBC", "FERRITIN" in the last 168 hours. Inpatient medications:   amLODipine  10 mg Oral Daily   aspirin  81 mg Oral Daily   atorvastatin  80 mg Oral Daily   carvedilol  6.25 mg Oral BID WC   Chlorhexidine Gluconate Cloth  6 each Topical Q0600   heparin injection (subcutaneous)  5,000 Units Subcutaneous Q8H   heparin sodium (porcine)       heparin sodium (porcine)  2,000 Units Intravenous Once   insulin aspart  0-5 Units Subcutaneous QHS   insulin aspart  0-9 Units Subcutaneous TID WC   insulin glargine-yfgn  5 Units Subcutaneous QHS   lidocaine  15 mL Oral Once   pantoprazole (PROTONIX) IV  40 mg Intravenous Q24H   senna-docusate  1 tablet Oral BID   sevelamer carbonate  4,800 mg Oral BID WC   sodium chloride flush  3 mL Intravenous Q12H   sodium chloride flush  3 mL Intravenous Q12H   Vitamin D (Ergocalciferol)  50,000 Units  Oral Q7 days    sodium chloride     sodium chloride, acetaminophen, heparin sodium (porcine), hydrOXYzine, nitroGLYCERIN, ondansetron (ZOFRAN) IV, oxyCODONE-acetaminophen, sodium chloride flush

## 2022-11-01 NOTE — Progress Notes (Signed)
Pt receives out-pt HD at [facility] on TTS. Will assist as needed.   Juanda Luba Renal Navigator [phone] 

## 2022-11-01 NOTE — Progress Notes (Addendum)
Rounding Note    Patient Name: [name] [name] Date of Encounter: 11/01/2022  [facility] Cardiologist: Peter Swaziland, MD   Subjective   Seen in HD, mostly complains of abd pain some chest discomfort. Nausea, dry heaving last evening.   Inpatient Medications    Scheduled Meds:  amLODipine  10 mg Oral Daily   aspirin  81 mg Oral Daily   atorvastatin  80 mg Oral Daily   Chlorhexidine Gluconate Cloth  6 each Topical Q0600   heparin injection (subcutaneous)  5,000 Units Subcutaneous Q8H   heparin sodium (porcine)       heparin sodium (porcine)  2,000 Units Intravenous Once   insulin aspart  0-5 Units Subcutaneous QHS   insulin aspart  0-9 Units Subcutaneous TID WC   insulin glargine-yfgn  5 Units Subcutaneous QHS   metoprolol succinate  25 mg Oral Daily   pantoprazole (PROTONIX) IV  40 mg Intravenous Q24H   senna-docusate  1 tablet Oral BID   sevelamer carbonate  4,800 mg Oral BID WC   sodium chloride flush  3 mL Intravenous Q12H   sodium chloride flush  3 mL Intravenous Q12H   Vitamin D (Ergocalciferol)  50,000 Units Oral Q7 days   Continuous Infusions:  sodium chloride     PRN Meds: sodium chloride, acetaminophen, heparin sodium (porcine), nitroGLYCERIN, ondansetron (ZOFRAN) IV, oxyCODONE-acetaminophen, sodium chloride flush   Vital Signs    Vitals:   11/01/22 0700 11/01/22 0730 11/01/22 0757 11/01/22 0831  BP: (!) 189/116 (!) 192/112 (!) 160/90 (!) 162/96  Pulse: 100 94 (!) 102 (!) 102  Resp: 17 20 (!) 21 (!) 21  Temp:      TempSrc:      SpO2: 100% 100% 100% 92%  Weight:      Height:       No intake or output data in the 24 hours ending 11/01/22 0845    11/01/2022    5:40 AM 10/31/2022   12:34 PM 10/31/2022    4:58 AM  Last 3 Weights  Weight (lbs) 264 lb 1.8 oz 263 lb 7.2 oz 114 lb 4.2 oz  Weight (kg) 119.8 kg 119.5 kg 51.828 kg      Telemetry    Sinus rhythm--sinus tachycardia - Personally Reviewed  ECG    Sinus tachycardia, 111bpm,  LAFB, LVH - Personally Reviewed  Physical Exam   GEN: No acute distress, but appears uncomfortable Neck: No JVD Cardiac: RRR, no murmurs, rubs, or gallops.  Respiratory: Clear to auscultation bilaterally. GI: Soft, nontender, non-distended  MS: No edema, left BKA. Right femoral cath site stable.  Neuro:  Nonfocal  Psych: Normal affect   Labs    High Sensitivity Troponin:   Recent Labs  Lab 10/30/22 2348 10/31/22 0229 10/31/22 1047 10/31/22 1808 11/01/22 0400  TROPONINIHS 445* 649* 677* 428* 205*     Chemistry Recent Labs  Lab 10/30/22 1204 10/31/22 0229 11/01/22 0359  NA 135 136 130*  K 4.1 4.5 4.6  CL 95* 94* 90*  CO2 25 27 23   GLUCOSE 191* 206* 187*  BUN 23* 32* 45*  CREATININE 6.23* 7.31* 9.40*  CALCIUM 10.1 10.0 9.9  PROT 8.7* 7.9  --   ALBUMIN 4.1 3.6 3.9  AST 21 18  --   ALT 18 17  --   ALKPHOS 150* 139*  --   BILITOT 0.9 0.9  --   GFRNONAA 8* 6* 5*  ANIONGAP 15 15 17*    Lipids  Recent Labs  Lab 10/31/22  1047  CHOL 137  TRIG 111  HDL 50  LDLCALC 65  CHOLHDL 2.7    Hematology Recent Labs  Lab 10/30/22 1204 11/01/22 0358  WBC 6.4 8.3  RBC 4.60 4.22  HGB 13.1 12.0  HCT 40.9 36.8  MCV 88.9 87.2  MCH 28.5 28.4  MCHC 32.0 32.6  RDW 13.3 13.8  PLT 166 213   Thyroid  Recent Labs  Lab 11/01/22 0401  TSH 3.123    BNPNo results for input(s): "BNP", "PROBNP" in the last 168 hours.  DDimer No results for input(s): "DDIMER" in the last 168 hours.   Radiology    CARDIAC CATHETERIZATION  Result Date: 10/31/2022   RPDA lesion is 20% stenosed.   1st Diag lesion is 20% stenosed.   Ost Cx lesion is 20% stenosed.   1st Mrg lesion is 30% stenosed.   The left ventricular systolic function is normal.   LV end diastolic pressure is mildly elevated.   The left ventricular ejection fraction is 55-65% by visual estimate.   Recommend Aspirin 81mg  daily for moderate CAD. Mild nonobstructive CAD with tortuous vessels most likely contributed by the  patient's significant hypertensive disease. The LAD has mild 20% smooth ostial narrowing of the first diagonal vessel; the circumflex vessel is smooth 20% ostial stenosis with 30% smooth stenosis number OM branch; large dominant RCA with 20% mild ostial PDA stenosis. Normal LV function with EF estimated 55 to 60% without wall motion abnormality.  There appears to be significant left ventricular hypertrophy.  LVEDP 19 mm. Successful Mynx closure of the right femoral access site. RECOMMENDATION: Troponin elevation most likely secondary to demand ischemia in this patient who presented with severely elevated stage II hypertension.  Medical therapy for concomitant CAD.   ECHOCARDIOGRAM COMPLETE  Result Date: 10/31/2022    ECHOCARDIOGRAM REPORT   Patient Name:   [name] [name] Date of Exam: 10/31/2022 Medical Rec #:  [id number]         Height:       69.0 in Accession #:    [id number]        Weight:       114.3 lb Date of Birth:  04-Nov-1975         BSA:          1.629 m Patient Age:    47 years          BP:           124/82 mmHg Patient Gender: F                 HR:           95 bpm. Exam Location:  Inpatient Procedure: 2D Echo, Cardiac Doppler, Color Doppler and Intracardiac            Opacification Agent Indications:    NSTEMI I21.4  History:        Patient has prior history of Echocardiogram examinations, most                 recent 10/22/2019. NSTEMI; Risk Factors:Hypertension, Diabetes,                 Dyslipidemia and Former Smoker.  Sonographer:    Aron Baba Referring Phys: [id number] EMILY B MULLEN IMPRESSIONS  1. Left ventricular ejection fraction, by estimation, is 60 to 65%. The left ventricle has normal function. The left ventricle has no regional wall motion abnormalities. Left ventricular diastolic parameters are consistent with Grade I diastolic dysfunction (impaired relaxation).  2. Right ventricular systolic function is normal. The right ventricular size is normal. There is moderately elevated pulmonary  artery systolic pressure. The estimated right ventricular systolic pressure is 50.8 mmHg.  3. Left atrial size was mildly dilated.  4. Right atrial size was mildly dilated.  5. The mitral valve is normal in structure. Trivial mitral valve regurgitation.  6. The aortic valve is tricuspid. Aortic valve regurgitation is not visualized. No aortic stenosis is present.  7. The inferior vena cava is normal in size with greater than 50% respiratory variability, suggesting right atrial pressure of 3 mmHg. Comparison(s): No significant change from prior study. FINDINGS  Left Ventricle: Left ventricular ejection fraction, by estimation, is 60 to 65%. The left ventricle has normal function. The left ventricle has no regional wall motion abnormalities. Definity contrast agent was given IV to delineate the left ventricular  endocardial borders. The left ventricular internal cavity size was normal in size. There is no left ventricular hypertrophy. Left ventricular diastolic parameters are consistent with Grade I diastolic dysfunction (impaired relaxation). Right Ventricle: The right ventricular size is normal. No increase in right ventricular wall thickness. Right ventricular systolic function is normal. There is moderately elevated pulmonary artery systolic pressure. The tricuspid regurgitant velocity is 3.27 m/s, and with an assumed right atrial pressure of 8 mmHg, the estimated right ventricular systolic pressure is 50.8 mmHg. Left Atrium: Left atrial size was mildly dilated. Right Atrium: Right atrial size was mildly dilated. Pericardium: There is no evidence of pericardial effusion. Mitral Valve: The mitral valve is normal in structure. Trivial mitral valve regurgitation. Tricuspid Valve: The tricuspid valve is normal in structure. Tricuspid valve regurgitation is trivial. Aortic Valve: The aortic valve is tricuspid. Aortic valve regurgitation is not visualized. No aortic stenosis is present. Pulmonic Valve: The pulmonic  valve was not well visualized. Pulmonic valve regurgitation is trivial. Aorta: The aortic root is normal in size and structure. Venous: The inferior vena cava is normal in size with greater than 50% respiratory variability, suggesting right atrial pressure of 3 mmHg. IAS/Shunts: The atrial septum is grossly normal.  LEFT VENTRICLE PLAX 2D LVIDd:         4.10 cm   Diastology LVIDs:         2.90 cm   LV e' medial:    11.60 cm/s LV PW:         1.20 cm   LV E/e' medial:  10.2 LV IVS:        1.10 cm   LV e' lateral:   12.20 cm/s LVOT diam:     2.10 cm   LV E/e' lateral: 9.7 LV SV:         75 LV SV Index:   46 LVOT Area:     3.46 cm  LEFT ATRIUM             Index        RIGHT ATRIUM           Index LA diam:        3.70 cm 2.27 cm/m   RA Area:     19.30 cm LA Vol (A2C):   58.0 ml 35.61 ml/m  RA Volume:   55.60 ml  34.14 ml/m LA Vol (A4C):   54.1 ml 33.22 ml/m LA Biplane Vol: 59.9 ml 36.78 ml/m  AORTIC VALVE LVOT Vmax:   123.00 cm/s LVOT Vmean:  81.400 cm/s LVOT VTI:    0.217 m  AORTA Ao Root diam: 3.10 cm Ao Asc  diam:  3.40 cm MITRAL VALVE                TRICUSPID VALVE MV Area (PHT): 4.06 cm     TR Peak grad:   42.8 mmHg MV Decel Time: 187 msec     TR Vmax:        327.00 cm/s MV E velocity: 118.00 cm/s MV A velocity: 128.00 cm/s  SHUNTS MV E/A ratio:  0.92         Systemic VTI:  0.22 m                             Systemic Diam: 2.10 cm Laurance Flatten MD Electronically signed by Laurance Flatten MD Signature Date/Time: 10/31/2022/1:33:22 PM    Final    CT ABDOMEN PELVIS WO CONTRAST  Result Date: 10/30/2022 CLINICAL DATA:  Abdominal pain. EXAM: CT ABDOMEN AND PELVIS WITHOUT CONTRAST TECHNIQUE: Multidetector CT imaging of the abdomen and pelvis was performed following the standard protocol without IV contrast. RADIATION DOSE REDUCTION: This exam was performed according to the departmental dose-optimization program which includes automated exposure control, adjustment of the mA and/or kV according to patient  size and/or use of iterative reconstruction technique. COMPARISON:  December 31, 2020 FINDINGS: Lower chest: Moderate-severity atelectatic changes are seen within the bilateral lung bases. Hepatobiliary: No focal liver abnormality is seen. Numerous tiny gallstones are seen within the lumen of a mildly distended gallbladder. There is no evidence of gallbladder wall thickening, pericholecystic inflammation or biliary dilatation. Pancreas: Unremarkable. No pancreatic ductal dilatation or surrounding inflammatory changes. Spleen: Normal in size without focal abnormality. Adrenals/Urinary Tract: Adrenal glands are unremarkable. The kidneys are slightly small in size, without renal calculi or hydronephrosis. A 1.8 cm cyst is seen within the mid right kidney. A 7 mm lipoma is seen within the posterior aspect of the mid left kidney. Bladder is unremarkable. Stomach/Bowel: Stomach is within normal limits. Appendix appears normal. No evidence of bowel wall thickening, distention, or inflammatory changes. Small, noninflamed diverticula are seen within the mid to distal sigmoid colon. Vascular/Lymphatic: Aortic atherosclerosis. No enlarged abdominal or pelvic lymph nodes. Reproductive: Uterus and bilateral adnexa are unremarkable. Other: There is a small, fat containing umbilical hernia. No abdominopelvic ascites. Musculoskeletal: No acute or significant osseous findings. IMPRESSION: 1. Cholelithiasis without evidence of acute cholecystitis. 2. Sigmoid diverticulosis. 3. Small, fat containing umbilical hernia. 4. Small right renal cyst and left renal lipoma. No follow-up imaging is recommended. This recommendation follows ACR consensus guidelines: Management of the Incidental Renal Mass on CT: A White Paper of the ACR Incidental Findings Committee. J Am Coll Radiol [phone]. 5. Moderate severity bibasilar atelectasis. 6. Aortic atherosclerosis. Aortic Atherosclerosis (ICD10-I70.0). Electronically Signed   By: Aram Candela M.D.   On: 10/30/2022 18:23   DG Chest 2 View  Result Date: 10/30/2022 CLINICAL DATA:  Upper abdominal pain. EXAM: CHEST - 2 VIEW COMPARISON:  01/05/2021 FINDINGS: Stable scarring right mid lung. No focal pneumonia or substantial pleural effusion. Mild vascular congestion evident. The cardiopericardial silhouette is within normal limits for size. Vascular stent device overlies the left subclavian region. IMPRESSION: Vascular congestion. Component of underlying mild interstitial edema not excluded. Electronically Signed   By: Kennith Center M.D.   On: 10/30/2022 12:59    Cardiac Studies   Cath: 10/31/2022    RPDA lesion is 20% stenosed.   1st Diag lesion is 20% stenosed.   Ost Cx lesion is 20% stenosed.  1st Mrg lesion is 30% stenosed.   The left ventricular systolic function is normal.   LV end diastolic pressure is mildly elevated.   The left ventricular ejection fraction is 55-65% by visual estimate.   Recommend Aspirin 81mg  daily for moderate CAD.   Mild nonobstructive CAD with tortuous vessels most likely contributed by the patient's significant hypertensive disease.   The LAD has mild 20% smooth ostial narrowing of the first diagonal vessel; the circumflex vessel is smooth 20% ostial stenosis with 30% smooth stenosis number OM branch; large dominant RCA with 20% mild ostial PDA stenosis.   Normal LV function with EF estimated 55 to 60% without wall motion abnormality.  There appears to be significant left ventricular hypertrophy.  LVEDP 19 mm.   Successful Mynx closure of the right femoral access site.   RECOMMENDATION: Troponin elevation most likely secondary to demand ischemia in this patient who presented with severely elevated stage II hypertension.  Medical therapy for concomitant CAD.  Echo: 10/31/2022  IMPRESSIONS     1. Left ventricular ejection fraction, by estimation, is 60 to 65%. The  left ventricle has normal function. The left ventricle has no regional   wall motion abnormalities. Left ventricular diastolic parameters are  consistent with Grade I diastolic  dysfunction (impaired relaxation).   2. Right ventricular systolic function is normal. The right ventricular  size is normal. There is moderately elevated pulmonary artery systolic  pressure. The estimated right ventricular systolic pressure is 50.8 mmHg.   3. Left atrial size was mildly dilated.   4. Right atrial size was mildly dilated.   5. The mitral valve is normal in structure. Trivial mitral valve  regurgitation.   6. The aortic valve is tricuspid. Aortic valve regurgitation is not  visualized. No aortic stenosis is present.   7. The inferior vena cava is normal in size with greater than 50%  respiratory variability, suggesting right atrial pressure of 3 mmHg.   Comparison(s): No significant change from prior study.   FINDINGS   Left Ventricle: Left ventricular ejection fraction, by estimation, is 60  to 65%. The left ventricle has normal function. The left ventricle has no  regional wall motion abnormalities. Definity contrast agent was given IV  to delineate the left ventricular   endocardial borders. The left ventricular internal cavity size was normal  in size. There is no left ventricular hypertrophy. Left ventricular  diastolic parameters are consistent with Grade I diastolic dysfunction  (impaired relaxation).   Right Ventricle: The right ventricular size is normal. No increase in  right ventricular wall thickness. Right ventricular systolic function is  normal. There is moderately elevated pulmonary artery systolic pressure.  The tricuspid regurgitant velocity is  3.27 m/s, and with an assumed right atrial pressure of 8 mmHg, the  estimated right ventricular systolic pressure is 50.8 mmHg.   Left Atrium: Left atrial size was mildly dilated.   Right Atrium: Right atrial size was mildly dilated.   Pericardium: There is no evidence of pericardial effusion.    Mitral Valve: The mitral valve is normal in structure. Trivial mitral  valve regurgitation.   Tricuspid Valve: The tricuspid valve is normal in structure. Tricuspid  valve regurgitation is trivial.   Aortic Valve: The aortic valve is tricuspid. Aortic valve regurgitation is  not visualized. No aortic stenosis is present.   Pulmonic Valve: The pulmonic valve was not well visualized. Pulmonic valve  regurgitation is trivial.   Aorta: The aortic root is normal in size and structure.  Venous: The inferior vena cava is normal in size with greater than 50%  respiratory variability, suggesting right atrial pressure of 3 mmHg.   IAS/Shunts: The atrial septum is grossly normal.   Patient Profile     47 y.o. female with a hx of ESRD  on HD, HTN, HLD and T2DM who was seen 10/31/2022 for the evaluation of NSTEMI at the request of Dr. Heide Spark    Assessment & Plan    Elevated troponin CAD -- hsTn 445>>649>>677>>426>>205. Underwent cardiac cath noted above with mild nonobstructive CAD. Mildly elevated LVEDP. Recommendations for medical therapy. Suspect demand ischemia in the setting of poorly controlled HTN -- continue ASA 81mg  daily, atorvastatin 80mg  daily   ESRD on HD -- seen in HD today, management per nephrology  HTN -- blood pressure poorly controlled this morning -- will switch metoprolol to coreg 6.25mg  BID, continue amlodipine 10mg  daily. If remains uncontrolled consider addition of hydralazine   HLD -- on atorvastatin 80mg  daily   DM -- Hgb A1c 7.7 (07/2022) -- on SSI -- per primary  Abd pain Nausea -- reports several episodes, triggered with eating yesterday -- work up per primary   For questions or updates, please contact [facility] Please consult [email or web address] for contact info under        Signed, Laverda Page, NP  11/01/2022, 8:45 AM

## 2022-11-01 NOTE — Progress Notes (Signed)
   11/01/22 1032  Provider Notification  Provider Name/Title Dr. Allena Katz- Internal Medicine Resident  Date Provider Notified 11/01/22  Time Provider Notified 1030  Method of Notification Page  Notification Reason Requested by patient/family   6E 08 Vanpatten, S. back from Dialysis in extreme pain. w/ n/v. Asking to see a MD urgently. Can you come to bedside? thanks Ivy RN  Patient came back from dialysis. Having complaints of pain, nausea, and vomiting.   Internal Medicine team paged. Dr. Sherrilee Gilles came to bedside to evaluate.  Order received for zofran, but patient recently had dose at 0800 so dose deferred.

## 2022-11-01 NOTE — Progress Notes (Signed)
Patient quite drowsy upon assessment. While giving meds patient slowly becoming more alert. Multiple blood pressure checks with systolic in the 70s and 80s. Attempted forearm, upper arm, and repositioning. Patient wanted pain medication but RN concerned for BP. Educated patient and waited for patient to become more alert. BP now 104/61. PRN percocet given for pain. Will continue to monitor.

## 2022-11-01 NOTE — Progress Notes (Signed)
   11/01/22 2052  BiPAP/CPAP/SIPAP  BiPAP/CPAP/SIPAP Pt Type Adult  Reason BIPAP/CPAP not in use Non-compliant

## 2022-11-01 NOTE — Progress Notes (Signed)
Subjective:  Overnight events: Night team was called to the bedside for chest pain. Patient was experiencing 10/10 chest pain associated with nausea and dry heaving. Patient was hypertensive to ~200/100s. Repeat EKG with increased T wave amplitude in infero-lateral leads, T wave inversion in aVL. Patient received 3 doses SL nitroglycerin and IV labetalol 10 mg x 1. Repeat troponin trending down at 205. Likely another episode of demand ischemia in the setting of severe hypertension.  Patient reported continued chest pain, upper abdominal pain, and nausea when seen this morning in HD. Blood pressure was 160/90 with heart rate in low 100s. Patient received ondansetron x 1. Discussed with patient how chest pain likely due to hypertension. Emphasized importance of taking antihypertensive medications.  IMTS was then called to bedside at 1045. Patient refusing to take any PO medications due to nausea, so she has not received any scheduled antihypertensives, pain medications, or stool softeners today. On reassessment, blood pressure had improved to 111/61 with heart rate 100-120s. Patient was attempting to have a bowel movement with no success. Patient reporting severe anxiety and continued chest pain and nausea. Ordered IV ativan 0.5 mg x 1. Will reassess later. Plan to manage anxiety and nausea so PO medications can be resumed.   Objective:  Vital signs in last 24 hours: Vitals:   11/01/22 0859 11/01/22 0930 11/01/22 0955 11/01/22 1000  BP: (!) 167/104 (!) 173/106 (!) 162/98 (!) 162/93  Pulse: 100 99 95 98  Resp: 19 14 (!) 22 17  Temp:    98.4 F (36.9 C)  TempSrc:      SpO2: 93% 97% 99% 100%  Weight:      Height:       Physical Exam: Constitutional: Lying on side in hospital bed. Appears uncomfortable and tearful. Cardiovascular: Tachycardic rate, regular rhythm. No murmurs, rubs, or gallops. Pulmonary: Normal respiratory effort on room air. No wheezes, rales, rhonchi, or crackles.    Abdominal: Soft. Non-distended. Mild tenderness to palpation. Hypoactive bowel sounds.  Musculoskeletal: Normal muscle bulk. No edema in RLE or left residual limb. Neurological: Alert and oriented to person, place, and time. Skin: Warm and dry. Hyperpigmented spots on upper extremities and upper back.   Assessment/Plan:  Principal Problem:   NSTEMI (non-ST elevated myocardial infarction) (HCC) Active Problems:   Primary hypertension   Type 2 diabetes mellitus (HCC)   ESRD (end stage renal disease) (HCC)   [name] [name] is a 47 year-old with ESRD, type 2 diabetes, and atherosclerotic cardiovascular disease who presented with chest pain, abdominal pain, nausea, and vomiting. Patient was admitted on 6/9 for rule out of ACS and found to have NSTEMI.   # Chest pain # NSTEMI Patient reports continued chest pain this morning with overnight events as described above. Echocardiogram on 6/10 with EF 60-65%, no regional wall motion abnormalities, and grade I diastolic dysfunction. LHC on 6/10 with mild stenosis of 20-30% throughout and left ventricular hypertrophy. Given the reassuring findings on echo and LHC, chest pain likely due to demand ischemia in the setting of severe hypertension. Suspect anxiety is also playing a big role. Patient received IV ativan 0.5 mg x 1. Also have hydroxyzine 25 mg TID PRN. Plan to control anxiety and nausea so PO medications can be resumed. - Appreciate cardiology recommendations - Aspirin 81 mg daily - Atorvastatin 80 mg daily - Hydroxyzine 25 mg TID PRN   # Abdominal pain/nausea/vomiting  Patient reports abdominal pain occurs simultaneously with the chest pain. Do not think any acute process  is occurring. Patient may be constipated since she has not had a recent bowel movement. Have started bowel regimen. - Senna-docusate BID - Miralax daily   # Severe hypertension Blood pressure elevated to ~200/100s overnight. Patient received SL nitroglycerin x 3 and  IV labetalol 10 mg x 1. Blood pressure improved to 160/90 this morning. Patient refusing to take any PO medications at this time. Plan to control nausea so PO medications can be resumed. Will continue home amlodipine 10 mg. Transitioned from home metoprolol to coreg 6.25 BID. Will continue to monitor closely. - Amlodipine 10 mg daily - Coreg 6.25 mg BID   # ESRD Patient on outpatient T/Th/S hemodialysis schedule. She received HD on 6/11 with 2.9L removed. Plan to resume outpatient schedule on Thursday (6/13). - Appreciate nephrology assistance   # Type 2 diabetes Blood glucose ranging between 137-189 this morning. Will continue glargine 5 units nightly with sensitive SSI with meals.  - Glargine 5 units nightly - Sensitive SSI   # Vitamin D deficiency Vitamin D at 4.45 on admission. Will provide supplementation. - Vitamin D 50,000 units once weekly   Diet: Renal Bowel: Senna VTE: heparin IVF: none Code: Full LOS: day 2  Whitman Hero, Medical Student 11/01/2022, 10:30 AM On call pager: [phone]

## 2022-11-01 NOTE — Progress Notes (Signed)
   11/01/22 1000  Vitals  Temp 98.4 F (36.9 C)  Pulse Rate 98  Resp 17  BP (!) 162/93  SpO2 100 %  Post Treatment  Dialyzer Clearance Lightly streaked  Duration of HD Treatment -hour(s) 3.5 hour(s)  Hemodialysis Intake (mL) 0 mL  Liters Processed 64.1  Fluid Removed (mL) 2900 mL  Tolerated HD Treatment Yes   Received patient in bed to unit.  Alert and oriented.  Informed consent signed and in chart.   TX duration:3.5hrs  Patient tolerated well.  Transported back to the room  Alert, without acute distress.  Hand-off given to patient's nurse.   Access used: LAVF Access issues: none  Total UF removed: 2.9L Medication(s) given: zofran    [facility]

## 2022-11-02 ENCOUNTER — Other Ambulatory Visit (HOSPITAL_COMMUNITY): Payer: Self-pay

## 2022-11-02 DIAGNOSIS — E1122 Type 2 diabetes mellitus with diabetic chronic kidney disease: Secondary | ICD-10-CM | POA: Diagnosis not present

## 2022-11-02 DIAGNOSIS — I214 Non-ST elevation (NSTEMI) myocardial infarction: Secondary | ICD-10-CM | POA: Diagnosis not present

## 2022-11-02 DIAGNOSIS — I120 Hypertensive chronic kidney disease with stage 5 chronic kidney disease or end stage renal disease: Secondary | ICD-10-CM | POA: Diagnosis not present

## 2022-11-02 DIAGNOSIS — N186 End stage renal disease: Secondary | ICD-10-CM | POA: Diagnosis not present

## 2022-11-02 LAB — RENAL FUNCTION PANEL
Albumin: 3.3 g/dL — ABNORMAL LOW (ref 3.5–5.0)
Anion gap: 12 (ref 5–15)
BUN: 30 mg/dL — ABNORMAL HIGH (ref 6–20)
CO2: 28 mmol/L (ref 22–32)
Calcium: 9.9 mg/dL (ref 8.9–10.3)
Chloride: 94 mmol/L — ABNORMAL LOW (ref 98–111)
Creatinine, Ser: 7.54 mg/dL — ABNORMAL HIGH (ref 0.44–1.00)
GFR, Estimated: 6 mL/min — ABNORMAL LOW (ref >60)
Glucose, Bld: 110 mg/dL — ABNORMAL HIGH (ref 70–99)
Phosphorus: 4.1 mg/dL (ref 2.5–4.6)
Potassium: 3.5 mmol/L (ref 3.5–5.1)
Sodium: 134 mmol/L — ABNORMAL LOW (ref 135–145)

## 2022-11-02 LAB — GLUCOSE, CAPILLARY
Glucose-Capillary: 160 mg/dL — ABNORMAL HIGH (ref 70–99)
Glucose-Capillary: 120 mg/dL — ABNORMAL HIGH (ref 70–99)

## 2022-11-02 LAB — LIPOPROTEIN A (LPA): Lipoprotein (a): 142.4 nmol/L — ABNORMAL HIGH (ref <75.0)

## 2022-11-02 MED ORDER — ATORVASTATIN CALCIUM 80 MG PO TABS
80.0000 mg | ORAL_TABLET | Freq: Every day | ORAL | 0 refills | Status: DC
  Filled 2022-11-02: qty 30, 30d supply, fill #0

## 2022-11-02 MED ORDER — ASPIRIN 81 MG PO CHEW
81.0000 mg | CHEWABLE_TABLET | Freq: Every day | ORAL | 0 refills | Status: AC
  Filled 2022-11-02: qty 90, 90d supply, fill #0

## 2022-11-02 MED ORDER — VITAMIN D (ERGOCALCIFEROL) 1.25 MG (50000 UNIT) PO CAPS
50000.0000 [IU] | ORAL_CAPSULE | ORAL | 0 refills | Status: DC
  Filled 2022-11-02: qty 9, 63d supply, fill #0

## 2022-11-02 MED ORDER — BISACODYL 10 MG RE SUPP
10.0000 mg | Freq: Once | RECTAL | Status: AC
  Administered 2022-11-02: 10 mg via RECTAL
  Filled 2022-11-02: qty 1

## 2022-11-02 MED ORDER — AMLODIPINE BESYLATE 10 MG PO TABS: 10.0000 mg | ORAL_TABLET | Freq: Every evening | ORAL | 1 refills | Status: DC

## 2022-11-02 MED ORDER — CHLORHEXIDINE GLUCONATE CLOTH 2 % EX PADS: 6.0000 | MEDICATED_PAD | Freq: Every day | CUTANEOUS | Status: DC

## 2022-11-02 MED ORDER — METOPROLOL SUCCINATE ER 25 MG PO TB24
25.0000 mg | ORAL_TABLET | Freq: Every evening | ORAL | 1 refills | Status: DC
Start: 2022-11-02 — End: 2022-11-21

## 2022-11-02 MED ORDER — HYDROXYZINE HCL 25 MG PO TABS
25.0000 mg | ORAL_TABLET | Freq: Three times a day (TID) | ORAL | 0 refills | Status: AC | PRN
  Filled 2022-11-02: qty 90, 30d supply, fill #0

## 2022-11-02 NOTE — TOC Initial Note (Addendum)
Transition of Care [facility]) - Initial/Assessment Note    Patient Details  Name: [name] [name] MRN: [id number] Date of Birth: 03-29-1976  [facility]) CM/SW Contact:    Delilah Shan, LCSWA Phone Number: 11/02/2022, 12:57 PM  Clinical Narrative:                  CSW spoke with patient at bedside. PTA patient reports she comes from home with Goddaughter. CSW received consult for patient. CSW offered patient food,psychiatry and counseling,transportation, [employer] resources. Patient accepted all resources. All questions answered. Patient reports she has transportation to HD through [facility] transportation.Patient informed CSW that someone from her family will provide transportation for her at dc. No further questions reported at this time.       Patient Goals and CMS Choice            Expected Discharge Plan and Services                                              Prior Living Arrangements/Services                       Activities of Daily Living Home Assistive Devices/Equipment: Crutches ADL Screening (condition at time of admission) Patient's cognitive ability adequate to safely complete daily activities?: Yes Is the patient deaf or have difficulty hearing?: No Does the patient have difficulty seeing, even when wearing glasses/contacts?: No Does the patient have difficulty concentrating, remembering, or making decisions?: No Patient able to express need for assistance with ADLs?: No Does the patient have difficulty dressing or bathing?: No Independently performs ADLs?: Yes (appropriate for developmental age) Does the patient have difficulty walking or climbing stairs?: Yes Weakness of Legs: Right  Permission Sought/Granted                  Emotional Assessment              Admission diagnosis:  Diverticula, colon [K57.30] Elevated blood pressure reading [R03.0] Gallstones [K80.20] Elevated  troponin [R79.89] ESRD on dialysis (HCC) [N18.6, Z99.2] NSTEMI (non-ST elevated myocardial infarction) (HCC) [I21.4] Renal cyst, right [N28.1] Upper abdominal pain [R10.10] Uncontrolled hypertension [I10] Nausea and vomiting in adult [R11.2] Essential hypertension [I10] Chest pain [R07.9] Umbilical hernia without obstruction and without gangrene [K42.9] Cannabinoid hyperemesis syndrome [R11.2, F12.90] Patient Active Problem List   Diagnosis Date Noted   NSTEMI (non-ST elevated myocardial infarction) (HCC) 10/31/2022   Trichomonas infection 09/06/2022   Bacterial vaginosis 09/06/2022   Acne 07/21/2022   Flu-like symptoms 05/29/2022   Hidradenitis suppurativa 02/24/2021   Chronic pain syndrome 01/16/2020   Hx of AKA (above knee amputation), left (HCC)    Blurry vision, right eye 10/08/2019   Tobacco use 03/01/2019   Phantom limb pain (HCC) 11/08/2018   Hyperkalemia    Anemia of chronic disease    Bipolar affective disorder (HCC)    Heart failure with preserved ejection fraction (HCC) 02/09/2017   Healthcare maintenance 12/08/2016   ESRD (end stage renal disease) (HCC) 07/15/2016   OSA (obstructive sleep apnea) 05/13/2016   Vaginal odor 03/14/2016   Morbid obesity due to excess calories (HCC) 11/27/2015   Subclinical hypothyroidism 04/25/2013   Type 2 diabetes mellitus (HCC) 07/12/2012   Carpal tunnel syndrome, bilateral 01/25/2012   Diabetic polyneuropathy associated with type 2 diabetes mellitus (HCC) 07/04/2011  Anxiety and depression 06/02/2011   GERD 12/06/2007   Hyperlipidemia 08/29/2007   Primary hypertension 08/29/2007   PCP:  Belva Agee, MD Pharmacy:   [facility] [place], [place] - [address] [place] [address] Phone: [phone] Fax: [phone]     Social Determinants of Health (SDOH) Social History: SDOH Screenings   Food Insecurity: Food Insecurity Present (10/31/2022)  Housing: Low Risk  (10/31/2022)   Transportation Needs: Unmet Transportation Needs (10/31/2022)  Utilities: Not At Risk (10/31/2022)  Alcohol Screen: Low Risk  (12/06/2021)  Depression (PHQ2-9): Medium Risk (09/02/2022)  Financial Resource Strain: High Risk (12/06/2021)  Social Connections: Moderately Integrated (12/06/2021)  Stress: Stress Concern Present (12/06/2021)  Tobacco Use: Medium Risk (11/01/2022)   SDOH Interventions:     Readmission Risk Interventions     No data to display

## 2022-11-02 NOTE — Progress Notes (Signed)
Patient given discharge instructions and verbalized understanding. PIV removed and patient dressed herself. TOC medications delivered to the room. Patient awaiting transportation home.

## 2022-11-02 NOTE — Progress Notes (Signed)
[facility] Progress Note   Subjective:   Patient seen and examined at bedside.  Sitting up in bed.  Reports feeling better.  Denies CP, SOB, abdominal pain and n/v/d.  Biggest complaint is back and knee pain.  Admits to eating a lot less while being on Mounjaro, sometimes she has to make herself eat.  Believes she has loss weight.    Objective Vitals:   11/02/22 0235 11/02/22 0435 11/02/22 0723 11/02/22 1149  BP: (!) 70/52 (!) 80/53 106/69 112/79  Pulse: 76 81 82 77  Resp: 15 16 (!) 25 14  Temp:  (!) 97.4 F (36.3 C) 97.8 F (36.6 C) 97.6 F (36.4 C)  TempSrc:  Oral Oral Oral  SpO2: 92% 100% 96% 90%  Weight:  118.8 kg    Height:       Physical Exam General:alert, pleasant female in NAD Heart:RRR, no mrg Lungs:CTAB, nml WOB on RA Abdomen:soft, NTND Extremities:no LE edema Dialysis Access: LU AVF +b/t   Filed Weights   10/31/22 1234 11/01/22 0540 11/02/22 0435  Weight: 119.5 kg 119.8 kg 118.8 kg    Intake/Output Summary (Last 24 hours) at 11/02/2022 1204 Last data filed at 11/02/2022 0516 Gross per 24 hour  Intake 360 ml  Output --  Net 360 ml    Additional Objective Labs: Basic Metabolic Panel: Recent Labs  Lab 11/01/22 0359 11/01/22 1149 11/02/22 0125  NA 130* 134* 134*  K 4.6 4.1 3.5  CL 90* 93* 94*  CO2 23 24 28   GLUCOSE 187* 131* 110*  BUN 45* 21* 30*  CREATININE 9.40* 5.83* 7.54*  CALCIUM 9.9 9.8 9.9  PHOS 3.6 2.8 4.1   Liver Function Tests: Recent Labs  Lab 10/30/22 1204 10/31/22 0229 11/01/22 0359 11/01/22 1149 11/02/22 0125  AST 21 18  --   --   --   ALT 18 17  --   --   --   ALKPHOS 150* 139*  --   --   --   BILITOT 0.9 0.9  --   --   --   PROT 8.7* 7.9  --   --   --   ALBUMIN 4.1 3.6 3.9 3.9 3.3*   Recent Labs  Lab 10/30/22 1204  LIPASE 132*   CBC: Recent Labs  Lab 10/30/22 1204 11/01/22 0358 11/01/22 1149  WBC 6.4 8.3 7.9  NEUTROABS 4.7  --  5.4  HGB 13.1 12.0 13.2  HCT 40.9 36.8 38.6  MCV 88.9 87.2  86.9  PLT 166 213 219   Blood Culture    Component Value Date/Time   SDES Urine 09/15/2021 1625   SPECREQUEST NONE 09/15/2021 1625   CULT (A) 09/15/2021 1625    10,000 COLONIES/mL GROUP B STREP(S.AGALACTIAE)ISOLATED TESTING AGAINST S. AGALACTIAE NOT ROUTINELY PERFORMED DUE TO PREDICTABILITY OF AMP/PEN/VAN SUSCEPTIBILITY. Performed at [facility], [address] [place], [place] [address]    REPTSTATUS 09/16/2021 FINAL 09/15/2021 1625    CBG: Recent Labs  Lab 11/01/22 1600 11/01/22 1946 11/01/22 2138 11/02/22 0811 11/02/22 1154  GLUCAP 108* 118* 116* 160* 120*    Studies/Results: CARDIAC CATHETERIZATION  Result Date: 10/31/2022   RPDA lesion is 20% stenosed.   1st Diag lesion is 20% stenosed.   Ost Cx lesion is 20% stenosed.   1st Mrg lesion is 30% stenosed.   The left ventricular systolic function is normal.   LV end diastolic pressure is mildly elevated.   The left ventricular ejection fraction is 55-65% by visual estimate.  Recommend Aspirin 81mg  daily for moderate CAD. Mild nonobstructive CAD with tortuous vessels most likely contributed by the patient's significant hypertensive disease. The LAD has mild 20% smooth ostial narrowing of the first diagonal vessel; the circumflex vessel is smooth 20% ostial stenosis with 30% smooth stenosis number OM branch; large dominant RCA with 20% mild ostial PDA stenosis. Normal LV function with EF estimated 55 to 60% without wall motion abnormality.  There appears to be significant left ventricular hypertrophy.  LVEDP 19 mm. Successful Mynx closure of the right femoral access site. RECOMMENDATION: Troponin elevation most likely secondary to demand ischemia in this patient who presented with severely elevated stage II hypertension.  Medical therapy for concomitant CAD.   ECHOCARDIOGRAM COMPLETE  Result Date: 10/31/2022    ECHOCARDIOGRAM REPORT   Patient Name:   MARICA TRENTHAM Date of Exam: 10/31/2022 Medical Rec #:  [id number]          Height:       69.0 in Accession #:    [id number]        Weight:       114.3 lb Date of Birth:  30-Jul-1975         BSA:          1.629 m Patient Age:    47 years          BP:           124/82 mmHg Patient Gender: F                 HR:           95 bpm. Exam Location:  Inpatient Procedure: 2D Echo, Cardiac Doppler, Color Doppler and Intracardiac            Opacification Agent Indications:    NSTEMI I21.4  History:        Patient has prior history of Echocardiogram examinations, most                 recent 10/22/2019. NSTEMI; Risk Factors:Hypertension, Diabetes,                 Dyslipidemia and Former Smoker.  Sonographer:    Aron Baba Referring Phys: [id number] EMILY B MULLEN IMPRESSIONS  1. Left ventricular ejection fraction, by estimation, is 60 to 65%. The left ventricle has normal function. The left ventricle has no regional wall motion abnormalities. Left ventricular diastolic parameters are consistent with Grade I diastolic dysfunction (impaired relaxation).  2. Right ventricular systolic function is normal. The right ventricular size is normal. There is moderately elevated pulmonary artery systolic pressure. The estimated right ventricular systolic pressure is 50.8 mmHg.  3. Left atrial size was mildly dilated.  4. Right atrial size was mildly dilated.  5. The mitral valve is normal in structure. Trivial mitral valve regurgitation.  6. The aortic valve is tricuspid. Aortic valve regurgitation is not visualized. No aortic stenosis is present.  7. The inferior vena cava is normal in size with greater than 50% respiratory variability, suggesting right atrial pressure of 3 mmHg. Comparison(s): No significant change from prior study. FINDINGS  Left Ventricle: Left ventricular ejection fraction, by estimation, is 60 to 65%. The left ventricle has normal function. The left ventricle has no regional wall motion abnormalities. Definity contrast agent was given IV to delineate the left ventricular  endocardial borders. The  left ventricular internal cavity size was normal in size. There is no left ventricular hypertrophy. Left ventricular diastolic parameters are consistent with Grade I diastolic dysfunction (  impaired relaxation). Right Ventricle: The right ventricular size is normal. No increase in right ventricular wall thickness. Right ventricular systolic function is normal. There is moderately elevated pulmonary artery systolic pressure. The tricuspid regurgitant velocity is 3.27 m/s, and with an assumed right atrial pressure of 8 mmHg, the estimated right ventricular systolic pressure is 50.8 mmHg. Left Atrium: Left atrial size was mildly dilated. Right Atrium: Right atrial size was mildly dilated. Pericardium: There is no evidence of pericardial effusion. Mitral Valve: The mitral valve is normal in structure. Trivial mitral valve regurgitation. Tricuspid Valve: The tricuspid valve is normal in structure. Tricuspid valve regurgitation is trivial. Aortic Valve: The aortic valve is tricuspid. Aortic valve regurgitation is not visualized. No aortic stenosis is present. Pulmonic Valve: The pulmonic valve was not well visualized. Pulmonic valve regurgitation is trivial. Aorta: The aortic root is normal in size and structure. Venous: The inferior vena cava is normal in size with greater than 50% respiratory variability, suggesting right atrial pressure of 3 mmHg. IAS/Shunts: The atrial septum is grossly normal.  LEFT VENTRICLE PLAX 2D LVIDd:         4.10 cm   Diastology LVIDs:         2.90 cm   LV e' medial:    11.60 cm/s LV PW:         1.20 cm   LV E/e' medial:  10.2 LV IVS:        1.10 cm   LV e' lateral:   12.20 cm/s LVOT diam:     2.10 cm   LV E/e' lateral: 9.7 LV SV:         75 LV SV Index:   46 LVOT Area:     3.46 cm  LEFT ATRIUM             Index        RIGHT ATRIUM           Index LA diam:        3.70 cm 2.27 cm/m   RA Area:     19.30 cm LA Vol (A2C):   58.0 ml 35.61 ml/m  RA Volume:   55.60 ml  34.14 ml/m LA Vol (A4C):    54.1 ml 33.22 ml/m LA Biplane Vol: 59.9 ml 36.78 ml/m  AORTIC VALVE LVOT Vmax:   123.00 cm/s LVOT Vmean:  81.400 cm/s LVOT VTI:    0.217 m  AORTA Ao Root diam: 3.10 cm Ao Asc diam:  3.40 cm MITRAL VALVE                TRICUSPID VALVE MV Area (PHT): 4.06 cm     TR Peak grad:   42.8 mmHg MV Decel Time: 187 msec     TR Vmax:        327.00 cm/s MV E velocity: 118.00 cm/s MV A velocity: 128.00 cm/s  SHUNTS MV E/A ratio:  0.92         Systemic VTI:  0.22 m                             Systemic Diam: 2.10 cm Laurance Flatten MD Electronically signed by Laurance Flatten MD Signature Date/Time: 10/31/2022/1:33:22 PM    Final     Medications:  sodium chloride      aspirin  81 mg Oral Daily   atorvastatin  80 mg Oral Daily   Chlorhexidine Gluconate Cloth  6 each Topical Q0600   heparin  injection (subcutaneous)  5,000 Units Subcutaneous Q8H   heparin sodium (porcine)  2,000 Units Intravenous Once   insulin aspart  0-5 Units Subcutaneous QHS   insulin aspart  0-9 Units Subcutaneous TID WC   insulin glargine-yfgn  5 Units Subcutaneous QHS   lidocaine  15 mL Oral Once   pantoprazole (PROTONIX) IV  40 mg Intravenous Q24H   polyethylene glycol  17 g Oral Daily   senna-docusate  1 tablet Oral BID   sevelamer carbonate  4,800 mg Oral BID WC   sodium chloride flush  3 mL Intravenous Q12H   sodium chloride flush  3 mL Intravenous Q12H   Vitamin D (Ergocalciferol)  50,000 Units Oral Q7 days    Dialysis Orders:  [facility]  4h  400/800  120.7kg  2/2 bath  AVF  Heparin 2000 - last OP HD 6/08, post wt 120.8 - hectorol 6 mcg IV TTS - mircera 30 mcg IV q 2 wks, last 6/08, due 6/22     Assessment/ Plan: Chest pain - LHC neg for sig CAD, most likely demand ischemia per cardiology.  Now resolved.  HTN - BP initially elevated but dropped post HD. Needs lower dry weight.  Would continue home regimen with holding parameters for post HD.   Volume - Does not appear volume overloaded but LVEDP was 19 which is  borderline high. Dry wt was recently lowered, taking Mounjaro and likely has had additional weight loss. Plan to further lower dry weight on discharge.  ESRD - on HD TTS. Next HD tomorrow if still here.  Anemia esrd - Hb 13, no esa needs MBD ckd - CCa and phos are in range. Cont binders.  DM2 - per pmd  Virgina Norfolk, PA-C [facility] 11/02/2022,12:04 PM  LOS: 2 days

## 2022-11-02 NOTE — Progress Notes (Signed)
Advised by renal PA that pt will d/c today per attending. Contacted [facility] to advise clinic of pt's planned d/c for today and that pt should resume care tomorrow.   Olivia Canter Renal Navigator [phone]

## 2022-11-02 NOTE — Discharge Summary (Addendum)
Name: [name] [name] MRN: [id number] DOB: 1975-11-03 47 y.o. PCP: Belva Agee, MD  Date of Admission: 10/30/2022 11:07 AM Date of Discharge:  11/02/2022 Attending Physician: Dr. Heide Spark  DISCHARGE DIAGNOSIS:  Primary Problem: NSTEMI (non-ST elevated myocardial infarction) [facility])   Hospital Problems: Principal Problem:   NSTEMI (non-ST elevated myocardial infarction) [facility]) Active Problems:   Primary hypertension   Type 2 diabetes mellitus (HCC)   ESRD (end stage renal disease) (HCC)    DISCHARGE MEDICATIONS:   Allergies as of 11/02/2022       Reactions   Vancomycin Other (See Comments)   Acute renal failure suspected secondary to vanco        Medication List     STOP taking these medications    amitriptyline 100 MG tablet Commonly known as: ELAVIL   ondansetron 8 MG tablet Commonly known as: ZOFRAN       TAKE these medications    allopurinol 100 MG tablet Commonly known as: ZYLOPRIM Take 1 tablet by mouth 3 times a week. Take only after dialysis sessions. Do not take on non-dialysis days. What changed: See the new instructions.   amLODipine 10 MG tablet Commonly known as: NORVASC Take 1 tablet (10 mg total) by mouth every evening. What changed: when to take this   aspirin 81 MG chewable tablet Chew 1 tablet (81 mg total) by mouth daily. Start taking on: November 03, 2022   atorvastatin 80 MG tablet Commonly known as: LIPITOR Take 1 tablet (80 mg total) by mouth daily. Start taking on: November 03, 2022 What changed:  medication strength how much to take   Benzoyl Peroxide 2.5 % Crea Apply 1 drop topically daily.   blood glucose meter kit and supplies Kit ICD 10- E11.69. Based on patient and insurance preference. Use up to four times daily as directed.   buPROPion 100 MG tablet Commonly known as: WELLBUTRIN Take 1 tablet (100 mg total) by mouth every other day.   cyclobenzaprine 5 MG tablet Commonly known as: FLEXERIL Take 1 tablet by  mouth daily as needed for muscle spasms.   diclofenac Sodium 1 % Gel Commonly known as: VOLTAREN APPLY 1 GRAM TOPICALLY TO AFFECTED AREA 4 TIMES DAILY AS NEEDED What changed: See the new instructions.   fluticasone 50 MCG/ACT nasal spray Commonly known as: FLONASE Place 1 spray into both nostrils daily. What changed:  when to take this reasons to take this   furosemide 20 MG tablet Commonly known as: LASIX Take 1 tablet by mouth daily. Take on non-dialysis days. Adjust per kidney docs What changed: See the new instructions.   hydrocortisone 2.5 % lotion Apply to affected area 2 times daily for 2-4 weeks   hydrOXYzine 25 MG tablet Commonly known as: ATARAX Take 1 tablet (25 mg total) by mouth 3 (three) times daily as needed for anxiety. What changed:  how much to take how to take this when to take this reasons to take this additional instructions   ketoconazole 2 % cream Commonly known as: NIZORAL Apply 1 application topically to affected area daily.   levothyroxine 88 MCG tablet Commonly known as: SYNTHROID Take 88 mcg by mouth daily.   metoprolol succinate 25 MG 24 hr tablet Commonly known as: TOPROL-XL Take 1 tablet (25 mg total) by mouth every evening. What changed: when to take this   Mounjaro 10 MG/0.5ML Pen Generic drug: tirzepatide Inject 5 mg into the skin once a week. Takes on Saturday.   ofloxacin 0.3 % ophthalmic solution Commonly  known as: OCUFLOX Place 1 drop into the left eye in the morning and at bedtime.   oxyCODONE-acetaminophen 7.5-325 MG tablet Commonly known as: PERCOCET Take 1 tablet by mouth every 8 (eight) hours as needed for moderate pain.   sevelamer carbonate 800 MG tablet Commonly known as: RENVELA Take 6 to 7 tablets by mouth twice a day with meals   tretinoin 0.025 % cream Commonly known as: RETIN-A Apply topically at bedtime.   TRUEplus Pen Needles 31G X 5 MM Misc Generic drug: Insulin Pen Needle 1 each by Other route 3  (three) times daily.   Vitamin D (Ergocalciferol) 1.25 MG (50000 UNIT) Caps capsule Commonly known as: DRISDOL Take 1 capsule (50,000 Units total) by mouth every 7 (seven) days. Start taking on: November 07, 2022        DISPOSITION AND FOLLOW-UP:  [name] [name] was discharged from [facility] in stable condition. At the hospital follow up visit please address:  Follow-up Recommendations: NSTEMI/Hypertension: Type 2 NSTEMI in the setting of severe hypertension. Ensure patient is taking amlodipine 10 mg and metoprolol succinate 25 mg once nightly. Recheck blood pressure at office visit and adjust antihypertensive regimen as needed. If patient is becoming hypotensive on dialysis days, metoprolol can be intermittently held per nephrology.  Abdominal Pain/Nausea/Vomiting: Likely due to chest pain, but ensure patient is having regular bowel movements since she was constipated while hospitalized. Start bowel regimen if needed.  Anxiety: Anxiety/panic attacks likely contributing to episodes of severe hypertension, tachycardia, and chest pain. Amitriptyline was discontinued at discharge due to prolonged QT. Hydroxyzine increased to 25 mg TID PRN. Reassess anxiety at office visit and adjust medications as needed.  Vitamin D deficiency: Vitamin D 4.45 on admission. Received 50,000 units x 1. Recommend continuing supplementation for 9 more weeks.  Medications: aspirin 81 mg daily, atorvastatin 80 mg daily, hydroxyzine 25 mg three times daily as needed, Vitamin D 50,000 units once weekly, STOP amitriptyline and ondansetron due to prolonged QT.  Follow-up Appointments:  Follow-up Information     [facility], [facility] In 1 week.   Specialty: General Surgery Contact information: [address] [place] [address] [phone]         Rocky Morel, DO. Go on 11/14/2022.   Why: Internal Medicine Center: 11/14/2022 9:45 AM Contact information: [address] [place] [address] [phone]                 HOSPITAL COURSE:   [name] [name] is a 47 year-old with ESRD, type 2 diabetes, and atherosclerotic cardiovascular disease who presented with chest pain, abdominal pain, nausea, and vomiting. Patient was admitted on 6/9 for rule out of ACS, later found to have NSTEMI.   # Chest pain # NSTEMI Patient presented on 6/9 with chest pain that was squeezing in nature and ranged from her sternum to epigastric region. The chest pain started while she was sitting and was only relieved by the medications she received in the ED. Initial troponin was 8. She received loading dose of aspirin 325 mg and admitted for rule out of ACS. Overnight, troponin trended up from [id number]. Blood pressure ~200/100s. EKG demonstrated new T wave inversions in inferior leads. Patient was started on heparin drip, and cardiology was consulted. Patient underwent echocardiogram and LHC on 6/10. Echocardiogram with EF 60-65%, no regional wall motion abnormalities, and grade I diastolic dysfunction. LHC with mild stenosis of 20-30% throughout and left ventricular hypertrophy. Given the reassuring findings on echo  and LHC, chest pain attributed to type 2 NSTEMI in the setting of severe hypertension. Patient continued to have intermittent episodes of chest pain on 6/11. Suspect anxiety/panic attacks are playing a role with these episodes since patient has recently been under more stress. Will discontinue amitriptyline at discharge due to prolonged QT. Will increase hydroxyzine to 25 mg TID PRN. Recommend outpatient follow-up for further medication adjustments for anxiety. Will continue aspirin 81 mg and atorvastatin 80 mg at discharge for NSTEMI.   # Abdominal pain/nausea/vomiting Patient reports abdominal pain and nausea occur simultaneously with the chest pain. CT abdomen pelvis without acute findings, incidental small gallstones, no pancreatic or peripancreatic  inflammation, no signs of obstruction, normal-appearing appendix, atherosclerosis. Do not think any acute process is occurring. Symptoms likely secondary to chest pain. Patient may also be constipated since she has not had a recent bowel movement. Started bowel regimen.   # Severe hypertension Blood pressure labile throughout hospitalization, ranging from 65-200s systolic. She was hypertensive before dialysis and then hypotensive after dialysis. Nephrology recommending patient resume home regimen of amlodipine 10 mg daily and metoprolol succinate 25 mg daily at discharge. Patient will then need to check blood pressure after dialysis before taking her medications at bedtime, and hold metoprolol if BP remains low.  # ESRD Patient on outpatient T/Th/S hemodialysis schedule. She received in hospital HD on 6/11. Patient can resume outpatient schedule at discharge.   # Type 2 diabetes Blood glucose well-controlled throughout hospitalization. Patient received glargine 5 units nightly with sensitive SSI at meals. Patient can resume home Mounjaro at discharge.   # Vitamin D deficiency Vitamin D at 4.45 on admission. Patient received Vitamin D 50,000 units x 1. Will continue supplementation at discharge for 9 more weeks. Will need levels rechecked outpatient.  DISCHARGE INSTRUCTIONS:   [name], It was a pleasure taking care of you while hospitalized. You were admitted for chest pain caused by high blood pressure. The ultrasound and imaging of your heart showed mild blockages in your arteries. You were started on two new medications for your heart called aspirin and atorvastatin. You will also need to continue your home amlodipine and metoprolol to control your blood pressure. Please take these at night. We have discontinued your amitriptyline and ondansetron due to changes present with your EKG. Stress/anxiety may also be playing a role in your chest pain. We have increased your hydroxyzine dose to 25 mg  three times daily as needed. You were also found to have Vitamin D deficiency, so you will need supplementation for the next 9 weeks.   Medication changes: Start taking aspirin 81 mg once daily Start taking atorvastatin 80 mg once daily Start taking Vitamin D 50,000 units once weekly for 9 more weeks Stop taking amitriptyline and ondansetron due to side effects on your EKG. Increase to hydroxyzine 25 mg three times daily as needed for anxiety. Continue taking amlodipine 10 mg once nightly. Continue taking metoprolol succinate 25 mg once nightly. Hold if blood pressure is low.    SUBJECTIVE:  Overnight events: Patient was hypotensive overnight. Percocet was held, amlodipine and coreg discontinued. Blood pressure returned to 108/69 this morning.  Patient feeling decent this morning. She was eating breakfast without nausea or vomiting. Her chest pain and abdominal pain have significantly improved. She still has not had a bowel movement. She denies any lightheadedness, dizziness, or palpitations. She reports increased stress due to family issues over the past couple months and wonders if anxiety/panic attacks could be contributing to  her chest pain. Discussed plan to monitor blood pressure and chest pain with plan to discharge if stable. All questions and concerns were addressed.  Discharge Vitals:   BP 112/79 (BP Location: Right Arm)   Pulse 77   Temp 97.6 F (36.4 C) (Oral)   Resp 14   Ht 5\' 9"  (1.753 m)   Wt 118.8 kg   SpO2 90%   BMI 38.68 kg/m   OBJECTIVE:  Physical Exam  Constitutional: Sitting comfortably on side hospital bed eating breakfast. No acute distress. Cardiovascular: Regular rate, regular rhythm. No murmurs, rubs, or gallops. Pulmonary: Normal respiratory effort on room air. No wheezes, rales, rhonchi, or crackles.   Abdominal: Soft. Non-distended. No tenderness to palpation. Hypoactive bowel sounds.  Musculoskeletal: Normal muscle bulk. No edema in RLE or left  residual limb. Neurological: Alert and oriented to person, place, and time. Skin: Warm and dry. Hyperpigmented spots on upper extremities and upper back.  Pertinent Labs, Studies, and Procedures:     Latest Ref Rng & Units 11/01/2022   11:49 AM 11/01/2022    3:58 AM 10/30/2022   12:04 PM  CBC  WBC 4.0 - 10.5 K/uL 7.9  8.3  6.4   Hemoglobin 12.0 - 15.0 g/dL 13.0  86.5  78.4   Hematocrit 36.0 - 46.0 % 38.6  36.8  40.9   Platelets 150 - 400 K/uL 219  213  166        Latest Ref Rng & Units 11/02/2022    1:25 AM 11/01/2022   11:49 AM 11/01/2022    3:59 AM  CMP  Glucose 70 - 99 mg/dL 696  295  284   BUN 6 - 20 mg/dL 30  21  45   Creatinine 0.44 - 1.00 mg/dL 1.32  4.40  1.02   Sodium 135 - 145 mmol/L 134  134  130   Potassium 3.5 - 5.1 mmol/L 3.5  4.1  4.6   Chloride 98 - 111 mmol/L 94  93  90   CO2 22 - 32 mmol/L 28  24  23    Calcium 8.9 - 10.3 mg/dL 9.9  9.8  9.9     CARDIAC CATHETERIZATION  Result Date: 10/31/2022   RPDA lesion is 20% stenosed.   1st Diag lesion is 20% stenosed.   Ost Cx lesion is 20% stenosed.   1st Mrg lesion is 30% stenosed.   The left ventricular systolic function is normal.   LV end diastolic pressure is mildly elevated.   The left ventricular ejection fraction is 55-65% by visual estimate.   Recommend Aspirin 81mg  daily for moderate CAD. Mild nonobstructive CAD with tortuous vessels most likely contributed by the patient's significant hypertensive disease. The LAD has mild 20% smooth ostial narrowing of the first diagonal vessel; the circumflex vessel is smooth 20% ostial stenosis with 30% smooth stenosis number OM branch; large dominant RCA with 20% mild ostial PDA stenosis. Normal LV function with EF estimated 55 to 60% without wall motion abnormality.  There appears to be significant left ventricular hypertrophy.  LVEDP 19 mm. Successful Mynx closure of the right femoral access site. RECOMMENDATION: Troponin elevation most likely secondary to demand ischemia in this  patient who presented with severely elevated stage II hypertension.  Medical therapy for concomitant CAD.   ECHOCARDIOGRAM COMPLETE  Result Date: 10/31/2022    ECHOCARDIOGRAM REPORT   Patient Name:   [name] [name] Date of Exam: 10/31/2022 Medical Rec #:  [id number]         Height:  69.0 in Accession #:    [id number]        Weight:       114.3 lb Date of Birth:  06-Jul-1975         BSA:          1.629 m Patient Age:    47 years          BP:           124/82 mmHg Patient Gender: F                 HR:           95 bpm. Exam Location:  Inpatient Procedure: 2D Echo, Cardiac Doppler, Color Doppler and Intracardiac            Opacification Agent Indications:    NSTEMI I21.4  History:        Patient has prior history of Echocardiogram examinations, most                 recent 10/22/2019. NSTEMI; Risk Factors:Hypertension, Diabetes,                 Dyslipidemia and Former Smoker.  Sonographer:    Aron Baba Referring Phys: [id number] EMILY B MULLEN IMPRESSIONS  1. Left ventricular ejection fraction, by estimation, is 60 to 65%. The left ventricle has normal function. The left ventricle has no regional wall motion abnormalities. Left ventricular diastolic parameters are consistent with Grade I diastolic dysfunction (impaired relaxation).  2. Right ventricular systolic function is normal. The right ventricular size is normal. There is moderately elevated pulmonary artery systolic pressure. The estimated right ventricular systolic pressure is 50.8 mmHg.  3. Left atrial size was mildly dilated.  4. Right atrial size was mildly dilated.  5. The mitral valve is normal in structure. Trivial mitral valve regurgitation.  6. The aortic valve is tricuspid. Aortic valve regurgitation is not visualized. No aortic stenosis is present.  7. The inferior vena cava is normal in size with greater than 50% respiratory variability, suggesting right atrial pressure of 3 mmHg. Comparison(s): No significant change from prior study. FINDINGS   Left Ventricle: Left ventricular ejection fraction, by estimation, is 60 to 65%. The left ventricle has normal function. The left ventricle has no regional wall motion abnormalities. Definity contrast agent was given IV to delineate the left ventricular  endocardial borders. The left ventricular internal cavity size was normal in size. There is no left ventricular hypertrophy. Left ventricular diastolic parameters are consistent with Grade I diastolic dysfunction (impaired relaxation). Right Ventricle: The right ventricular size is normal. No increase in right ventricular wall thickness. Right ventricular systolic function is normal. There is moderately elevated pulmonary artery systolic pressure. The tricuspid regurgitant velocity is 3.27 m/s, and with an assumed right atrial pressure of 8 mmHg, the estimated right ventricular systolic pressure is 50.8 mmHg. Left Atrium: Left atrial size was mildly dilated. Right Atrium: Right atrial size was mildly dilated. Pericardium: There is no evidence of pericardial effusion. Mitral Valve: The mitral valve is normal in structure. Trivial mitral valve regurgitation. Tricuspid Valve: The tricuspid valve is normal in structure. Tricuspid valve regurgitation is trivial. Aortic Valve: The aortic valve is tricuspid. Aortic valve regurgitation is not visualized. No aortic stenosis is present. Pulmonic Valve: The pulmonic valve was not well visualized. Pulmonic valve regurgitation is trivial. Aorta: The aortic root is normal in size and structure. Venous: The inferior vena cava is normal in size with greater than 50% respiratory variability, suggesting  right atrial pressure of 3 mmHg. IAS/Shunts: The atrial septum is grossly normal.  LEFT VENTRICLE PLAX 2D LVIDd:         4.10 cm   Diastology LVIDs:         2.90 cm   LV e' medial:    11.60 cm/s LV PW:         1.20 cm   LV E/e' medial:  10.2 LV IVS:        1.10 cm   LV e' lateral:   12.20 cm/s LVOT diam:     2.10 cm   LV E/e' lateral:  9.7 LV SV:         75 LV SV Index:   46 LVOT Area:     3.46 cm  LEFT ATRIUM             Index        RIGHT ATRIUM           Index LA diam:        3.70 cm 2.27 cm/m   RA Area:     19.30 cm LA Vol (A2C):   58.0 ml 35.61 ml/m  RA Volume:   55.60 ml  34.14 ml/m LA Vol (A4C):   54.1 ml 33.22 ml/m LA Biplane Vol: 59.9 ml 36.78 ml/m  AORTIC VALVE LVOT Vmax:   123.00 cm/s LVOT Vmean:  81.400 cm/s LVOT VTI:    0.217 m  AORTA Ao Root diam: 3.10 cm Ao Asc diam:  3.40 cm MITRAL VALVE                TRICUSPID VALVE MV Area (PHT): 4.06 cm     TR Peak grad:   42.8 mmHg MV Decel Time: 187 msec     TR Vmax:        327.00 cm/s MV E velocity: 118.00 cm/s MV A velocity: 128.00 cm/s  SHUNTS MV E/A ratio:  0.92         Systemic VTI:  0.22 m                             Systemic Diam: 2.10 cm Laurance Flatten MD Electronically signed by Laurance Flatten MD Signature Date/Time: 10/31/2022/1:33:22 PM    Final    CT ABDOMEN PELVIS WO CONTRAST  Result Date: 10/30/2022 CLINICAL DATA:  Abdominal pain. EXAM: CT ABDOMEN AND PELVIS WITHOUT CONTRAST TECHNIQUE: Multidetector CT imaging of the abdomen and pelvis was performed following the standard protocol without IV contrast. RADIATION DOSE REDUCTION: This exam was performed according to the departmental dose-optimization program which includes automated exposure control, adjustment of the mA and/or kV according to patient size and/or use of iterative reconstruction technique. COMPARISON:  December 31, 2020 FINDINGS: Lower chest: Moderate-severity atelectatic changes are seen within the bilateral lung bases. Hepatobiliary: No focal liver abnormality is seen. Numerous tiny gallstones are seen within the lumen of a mildly distended gallbladder. There is no evidence of gallbladder wall thickening, pericholecystic inflammation or biliary dilatation. Pancreas: Unremarkable. No pancreatic ductal dilatation or surrounding inflammatory changes. Spleen: Normal in size without focal abnormality.  Adrenals/Urinary Tract: Adrenal glands are unremarkable. The kidneys are slightly small in size, without renal calculi or hydronephrosis. A 1.8 cm cyst is seen within the mid right kidney. A 7 mm lipoma is seen within the posterior aspect of the mid left kidney. Bladder is unremarkable. Stomach/Bowel: Stomach is within normal limits. Appendix appears normal. No evidence of bowel wall  thickening, distention, or inflammatory changes. Small, noninflamed diverticula are seen within the mid to distal sigmoid colon. Vascular/Lymphatic: Aortic atherosclerosis. No enlarged abdominal or pelvic lymph nodes. Reproductive: Uterus and bilateral adnexa are unremarkable. Other: There is a small, fat containing umbilical hernia. No abdominopelvic ascites. Musculoskeletal: No acute or significant osseous findings. IMPRESSION: 1. Cholelithiasis without evidence of acute cholecystitis. 2. Sigmoid diverticulosis. 3. Small, fat containing umbilical hernia. 4. Small right renal cyst and left renal lipoma. No follow-up imaging is recommended. This recommendation follows ACR consensus guidelines: Management of the Incidental Renal Mass on CT: A White Paper of the ACR Incidental Findings Committee. J Am Coll Radiol [phone]. 5. Moderate severity bibasilar atelectasis. 6. Aortic atherosclerosis. Aortic Atherosclerosis (ICD10-I70.0). Electronically Signed   By: Aram Candela M.D.   On: 10/30/2022 18:23   DG Chest 2 View  Result Date: 10/30/2022 CLINICAL DATA:  Upper abdominal pain. EXAM: CHEST - 2 VIEW COMPARISON:  01/05/2021 FINDINGS: Stable scarring right mid lung. No focal pneumonia or substantial pleural effusion. Mild vascular congestion evident. The cardiopericardial silhouette is within normal limits for size. Vascular stent device overlies the left subclavian region. IMPRESSION: Vascular congestion. Component of underlying mild interstitial edema not excluded. Electronically Signed   By: Kennith Center M.D.   On:  10/30/2022 12:59     Signed: Gillermina Phy, Medical Student   Attestation for Student Documentation:  I personally was present and performed or re-performed the history, physical exam and medical decision-making activities of this service and have verified that the service and findings are accurately documented in the student's note.  Rana Snare, DO 11/02/2022, 2:07 PM

## 2022-11-02 NOTE — Plan of Care (Signed)
[facility] Patient Discharge Orders- [facility] CLINIC: [place]  Patient's name: [name] [name] Admit/DC Dates: 10/30/2022 - 11/02/2022  Discharge Diagnoses: NSTEMI   Hypertension  Aranesp: Given: none   Last Hgb: 13.2 PRBC's Given: none Date/# of units: n/a ESA dose for discharge: none IV Iron dose at discharge: none  Heparin change: no  EDW Change: yes New EDW: 119kg, if comes in at dry weight challenge 1kg and lower dry.  Likely needs additional lowering of dry weight  Bath Change: none  Access intervention/Change: none Details:  Hectorol/Calcitriol change: none  Discharge Labs: Calcium9.9 Phosphorus 4.1 Albumin 3.3 K+ 3.5  ONSP - yes  IV Antibiotics: none Details:  On Coumadin?: no Last INR: Next INR: Managed By:   OTHER/APPTS/LAB ORDERS:    D/C Meds to be reconciled by nurse after every discharge.  Completed By:   Reviewed by: MD:______ RN_______

## 2022-11-03 ENCOUNTER — Telehealth: Payer: Self-pay

## 2022-11-03 ENCOUNTER — Telehealth: Payer: Self-pay | Admitting: Nephrology

## 2022-11-03 DIAGNOSIS — L299 Pruritus, unspecified: Secondary | ICD-10-CM | POA: Diagnosis not present

## 2022-11-03 DIAGNOSIS — R197 Diarrhea, unspecified: Secondary | ICD-10-CM | POA: Diagnosis not present

## 2022-11-03 DIAGNOSIS — N186 End stage renal disease: Secondary | ICD-10-CM | POA: Diagnosis not present

## 2022-11-03 DIAGNOSIS — D689 Coagulation defect, unspecified: Secondary | ICD-10-CM | POA: Diagnosis not present

## 2022-11-03 DIAGNOSIS — D631 Anemia in chronic kidney disease: Secondary | ICD-10-CM | POA: Diagnosis not present

## 2022-11-03 DIAGNOSIS — Z992 Dependence on renal dialysis: Secondary | ICD-10-CM | POA: Diagnosis not present

## 2022-11-03 DIAGNOSIS — D509 Iron deficiency anemia, unspecified: Secondary | ICD-10-CM | POA: Diagnosis not present

## 2022-11-03 DIAGNOSIS — N2581 Secondary hyperparathyroidism of renal origin: Secondary | ICD-10-CM | POA: Diagnosis not present

## 2022-11-03 NOTE — Telephone Encounter (Signed)
Transition of Care Contact from Inpatient Facility   Date of Discharge: 11/02/22 Date of Contact: 11/03/22 Method of contact: phone Talked to patient   Patient contacted to discuss transition of care form recent hospitaliztion. Patient was admitted to [facility] from 10/30/22 to 11/02/22 with the discharge diagnosis of NSTEMI, HTN, volume overload.     Medication changes were reviewed.  Patient will follow up with is outpatient dialysis center 11/03/22.  Other follow up needs include none identified.   Virgina Norfolk, PA-C [employer]

## 2022-11-03 NOTE — Transitions of Care (Post Inpatient/ED Visit) (Signed)
11/03/2022  Name: [name] [name] MRN: [id number] DOB: Jun 20, 1975  Today's TOC FU Call Status: Today's TOC FU Call Status:: Successful TOC FU Call Competed TOC FU Call Complete Date: 11/03/22  Transition Care Management Follow-up Telephone Call Date of Discharge: 11/02/22 Discharge Facility: [facility]) Type of Discharge: Inpatient Admission Primary Inpatient Discharge Diagnosis:: NSTEMI How have you been since you were released from the hospital?: Better (Patient feels very anxious.  We discussed her medication for anxiety and encouraged her to speak with Dr. Geraldo Pitter at [facility]) Any questions or concerns?: No  Items Reviewed: Did you receive and understand the discharge instructions provided?: Yes Medications obtained,verified, and reconciled?: Yes (Medications Reviewed) Any new allergies since your discharge?: No Dietary orders reviewed?: Yes Type of Diet Ordered:: Heart Healty Do you have support at home?: Yes People in Home: sibling(s) Name of Support/Comfort Primary Source: [name] [name]  Medications Reviewed Today: Medications Reviewed Today     Reviewed by [name], RN (Case Manager) on 11/03/22 at 1135  Med List Status: <None>   Medication Order Taking? Sig Documenting Provider Last Dose Status Informant  allopurinol (ZYLOPRIM) 100 MG tablet [id number] Yes Take 1 tablet by mouth 3 times a week. Take only after dialysis sessions. Do not take on non-dialysis days.  Patient taking differently: Take 100 mg by mouth 3 (three) times a week. Take only after dialysis sessions. Do not take on non-dialysis days.   Belva Agee, MD Taking Active   amLODipine (NORVASC) 10 MG tablet [id number] Yes Take 1 tablet (10 mg total) by mouth every evening. Rana Snare, DO Taking Active   aspirin 81 MG chewable tablet [id number] Yes Chew 1 tablet (81 mg total) by mouth daily. Rana Snare, DO Taking Active   atorvastatin (LIPITOR) 80 MG tablet [id number] Yes Take 1 tablet (80 mg  total) by mouth daily. Rana Snare, DO Taking Active   Benzoyl Peroxide 2.5 % CREA [id number] No Apply 1 drop topically daily. Belva Agee, MD Unknown Active   blood glucose meter kit and supplies KIT [id number] Yes ICD 10- E11.69. Based on patient and insurance preference. Use up to four times daily as directed. [name] Cree, PA-C Taking Active Self  buPROPion [facility]) 100 MG tablet [id number] Yes Take 1 tablet (100 mg total) by mouth every other day. Belva Agee, MD Taking Active   cyclobenzaprine (FLEXERIL) 5 MG tablet [id number] No Take 1 tablet by mouth daily as needed for muscle spasms. Belva Agee, MD Unknown Active   diclofenac Sodium (VOLTAREN) 1 % GEL [id number] Yes APPLY 1 GRAM TOPICALLY TO AFFECTED AREA 4 TIMES DAILY AS NEEDED  Patient taking differently: Apply 1 g topically 4 (four) times daily as needed (for pain).   Belva Agee, MD Taking Active   fluticasone (FLONASE) 50 MCG/ACT nasal spray [id number] Yes Place 1 spray into both nostrils daily.  Patient taking differently: Place 1 spray into both nostrils daily as needed for allergies.   Belva Agee, MD Taking Active   furosemide (LASIX) 20 MG tablet [id number] Yes Take 1 tablet by mouth daily. Take on non-dialysis days. Adjust per kidney docs  Patient taking differently: Take 20 mg by mouth daily. Take on non-dialysis days. Adjust per kidney docs   Belva Agee, MD Taking Active   hydrocortisone 2.5 % lotion [id number] No Apply to affected area 2 times daily for 2-4 weeks Katsadouros, Vasilios, MD Unknown Active   hydrOXYzine (ATARAX) 25 MG tablet [id number] Yes Take 1 tablet (25 mg total) by mouth 3 (three) times daily as  needed for anxiety. Rana Snare, DO Taking Active   Insulin Pen Needle (TRUEPLUS PEN NEEDLES) 31G X 5 MM MISC [id number] Yes 1 each by Other route 3 (three) times daily. Belva Agee, MD Taking Active   ketoconazole (NIZORAL) 2 % cream [id number]  No Apply 1 application topically to affected area daily. Belva Agee, MD Unknown Active   levothyroxine (SYNTHROID) 88 MCG tablet [id number] Yes Take 88 mcg by mouth daily. [provider] Taking Active Self  metoprolol succinate (TOPROL-XL) 25 MG 24 hr tablet [id number] Yes Take 1 tablet (25 mg total) by mouth every evening. Rana Snare, DO Taking Active   ofloxacin (OCUFLOX) 0.3 % ophthalmic solution [id number] Yes Place 1 drop into the left eye in the morning and at bedtime. [provider] Taking Active   oxyCODONE-acetaminophen (PERCOCET) 7.5-325 MG tablet [id number] Yes Take 1 tablet by mouth every 8 (eight) hours as needed for moderate pain. [provider] Taking Active   sevelamer carbonate (RENVELA) 800 MG tablet [id number]  Take 6 to 7 tablets by mouth twice a day with meals [provider]  Active Self           Med Note (WHITE, [place] Oct 31, 2022  1:56 AM) Patient corrected me when I asked about this medication again.   tirzepatide [facility]) 10 MG/0.5ML Pen [id number]  Inject 5 mg into the skin once a week. Takes on Saturday. Belva Agee, MD  Active   tretinoin (RETIN-A) 0.025 % cream [id number]  Apply topically at bedtime. Belva Agee, MD  Active   Vitamin D, Ergocalciferol, (DRISDOL) 1.25 MG (50000 UNIT) CAPS capsule [id number] Yes Take 1 capsule (50,000 Units total) by mouth every 7 (seven) days. Rana Snare, DO Taking Active             Home Care and Equipment/Supplies: Were Home Health Services Ordered?: No Any new equipment or medical supplies ordered?: No  Functional Questionnaire: Do you need assistance with bathing/showering or dressing?: No Do you need assistance with meal preparation?: No Do you need assistance with eating?: No Do you have difficulty maintaining continence: No Do you need assistance with getting out of bed/getting out of a chair/moving?: No Do you have difficulty managing or  taking your medications?: No  Follow up appointments reviewed: PCP Follow-up appointment confirmed?: Yes Date of PCP follow-up appointment?: 11/14/22 Follow-up Provider: Dr. Geraldo Pitter Specialist [facility] Follow-up appointment confirmed?: NA Do you need transportation to your follow-up appointment?: No Do you understand care options if your condition(s) worsen?: Yes-patient verbalized understanding  SDOH Interventions Today    Flowsheet Row Most Recent Value  SDOH Interventions   Housing Interventions Intervention Not Indicated  Utilities Interventions Intervention Not Indicated      Interventions Today    Flowsheet Row Most Recent Value  Chronic Disease   Chronic disease during today's visit Chronic Kidney Disease/End Stage Renal Disease (ESRD), Hypertension (HTN), Diabetes  General Interventions   General Interventions Discussed/Reviewed General Interventions Discussed, Doctor Visits  Doctor Visits Discussed/Reviewed Doctor Visits Discussed       TOC Interventions Today    Flowsheet Row Most Recent Value  TOC Interventions   TOC Interventions Discussed/Reviewed TOC Interventions Discussed, TOC Interventions Reviewed       [name], RN, BSN, CCM Care Management Coordinator [facility] Phone: [phone]/Fax: [phone]

## 2022-11-05 DIAGNOSIS — D631 Anemia in chronic kidney disease: Secondary | ICD-10-CM | POA: Diagnosis not present

## 2022-11-05 DIAGNOSIS — L299 Pruritus, unspecified: Secondary | ICD-10-CM | POA: Diagnosis not present

## 2022-11-05 DIAGNOSIS — N2581 Secondary hyperparathyroidism of renal origin: Secondary | ICD-10-CM | POA: Diagnosis not present

## 2022-11-05 DIAGNOSIS — D509 Iron deficiency anemia, unspecified: Secondary | ICD-10-CM | POA: Diagnosis not present

## 2022-11-05 DIAGNOSIS — D689 Coagulation defect, unspecified: Secondary | ICD-10-CM | POA: Diagnosis not present

## 2022-11-05 DIAGNOSIS — N186 End stage renal disease: Secondary | ICD-10-CM | POA: Diagnosis not present

## 2022-11-05 DIAGNOSIS — Z992 Dependence on renal dialysis: Secondary | ICD-10-CM | POA: Diagnosis not present

## 2022-11-05 DIAGNOSIS — R197 Diarrhea, unspecified: Secondary | ICD-10-CM | POA: Diagnosis not present

## 2022-11-07 ENCOUNTER — Other Ambulatory Visit: Payer: Self-pay | Admitting: Student

## 2022-11-07 DIAGNOSIS — G4733 Obstructive sleep apnea (adult) (pediatric): Secondary | ICD-10-CM | POA: Diagnosis not present

## 2022-11-07 DIAGNOSIS — G546 Phantom limb syndrome with pain: Secondary | ICD-10-CM | POA: Diagnosis not present

## 2022-11-07 DIAGNOSIS — Z79899 Other long term (current) drug therapy: Secondary | ICD-10-CM | POA: Diagnosis not present

## 2022-11-07 DIAGNOSIS — Z79891 Long term (current) use of opiate analgesic: Secondary | ICD-10-CM | POA: Diagnosis not present

## 2022-11-07 DIAGNOSIS — N186 End stage renal disease: Secondary | ICD-10-CM | POA: Diagnosis not present

## 2022-11-07 DIAGNOSIS — M25562 Pain in left knee: Secondary | ICD-10-CM | POA: Diagnosis not present

## 2022-11-07 DIAGNOSIS — Z992 Dependence on renal dialysis: Secondary | ICD-10-CM | POA: Diagnosis not present

## 2022-11-07 DIAGNOSIS — G894 Chronic pain syndrome: Secondary | ICD-10-CM | POA: Diagnosis not present

## 2022-11-07 DIAGNOSIS — I152 Hypertension secondary to endocrine disorders: Secondary | ICD-10-CM

## 2022-11-07 DIAGNOSIS — G890 Central pain syndrome: Secondary | ICD-10-CM | POA: Diagnosis not present

## 2022-11-07 DIAGNOSIS — I159 Secondary hypertension, unspecified: Secondary | ICD-10-CM | POA: Diagnosis not present

## 2022-11-07 DIAGNOSIS — M79643 Pain in unspecified hand: Secondary | ICD-10-CM | POA: Diagnosis not present

## 2022-11-07 DIAGNOSIS — M25519 Pain in unspecified shoulder: Secondary | ICD-10-CM | POA: Diagnosis not present

## 2022-11-08 ENCOUNTER — Encounter: Payer: 59 | Admitting: Student

## 2022-11-08 ENCOUNTER — Other Ambulatory Visit: Payer: Self-pay | Admitting: Student

## 2022-11-08 DIAGNOSIS — E1159 Type 2 diabetes mellitus with other circulatory complications: Secondary | ICD-10-CM

## 2022-11-08 DIAGNOSIS — R197 Diarrhea, unspecified: Secondary | ICD-10-CM | POA: Diagnosis not present

## 2022-11-08 DIAGNOSIS — D509 Iron deficiency anemia, unspecified: Secondary | ICD-10-CM | POA: Diagnosis not present

## 2022-11-08 DIAGNOSIS — Z992 Dependence on renal dialysis: Secondary | ICD-10-CM | POA: Diagnosis not present

## 2022-11-08 DIAGNOSIS — N186 End stage renal disease: Secondary | ICD-10-CM | POA: Diagnosis not present

## 2022-11-08 DIAGNOSIS — L299 Pruritus, unspecified: Secondary | ICD-10-CM | POA: Diagnosis not present

## 2022-11-08 DIAGNOSIS — D631 Anemia in chronic kidney disease: Secondary | ICD-10-CM | POA: Diagnosis not present

## 2022-11-08 DIAGNOSIS — N2581 Secondary hyperparathyroidism of renal origin: Secondary | ICD-10-CM | POA: Diagnosis not present

## 2022-11-08 DIAGNOSIS — D689 Coagulation defect, unspecified: Secondary | ICD-10-CM | POA: Diagnosis not present

## 2022-11-10 DIAGNOSIS — L299 Pruritus, unspecified: Secondary | ICD-10-CM | POA: Diagnosis not present

## 2022-11-10 DIAGNOSIS — D631 Anemia in chronic kidney disease: Secondary | ICD-10-CM | POA: Diagnosis not present

## 2022-11-10 DIAGNOSIS — D509 Iron deficiency anemia, unspecified: Secondary | ICD-10-CM | POA: Diagnosis not present

## 2022-11-10 DIAGNOSIS — R197 Diarrhea, unspecified: Secondary | ICD-10-CM | POA: Diagnosis not present

## 2022-11-10 DIAGNOSIS — N2581 Secondary hyperparathyroidism of renal origin: Secondary | ICD-10-CM | POA: Diagnosis not present

## 2022-11-10 DIAGNOSIS — N186 End stage renal disease: Secondary | ICD-10-CM | POA: Diagnosis not present

## 2022-11-10 DIAGNOSIS — Z992 Dependence on renal dialysis: Secondary | ICD-10-CM | POA: Diagnosis not present

## 2022-11-10 DIAGNOSIS — D689 Coagulation defect, unspecified: Secondary | ICD-10-CM | POA: Diagnosis not present

## 2022-11-12 DIAGNOSIS — D631 Anemia in chronic kidney disease: Secondary | ICD-10-CM | POA: Diagnosis not present

## 2022-11-12 DIAGNOSIS — L299 Pruritus, unspecified: Secondary | ICD-10-CM | POA: Diagnosis not present

## 2022-11-12 DIAGNOSIS — Z992 Dependence on renal dialysis: Secondary | ICD-10-CM | POA: Diagnosis not present

## 2022-11-12 DIAGNOSIS — R197 Diarrhea, unspecified: Secondary | ICD-10-CM | POA: Diagnosis not present

## 2022-11-12 DIAGNOSIS — N2581 Secondary hyperparathyroidism of renal origin: Secondary | ICD-10-CM | POA: Diagnosis not present

## 2022-11-12 DIAGNOSIS — N186 End stage renal disease: Secondary | ICD-10-CM | POA: Diagnosis not present

## 2022-11-12 DIAGNOSIS — D689 Coagulation defect, unspecified: Secondary | ICD-10-CM | POA: Diagnosis not present

## 2022-11-12 DIAGNOSIS — D509 Iron deficiency anemia, unspecified: Secondary | ICD-10-CM | POA: Diagnosis not present

## 2022-11-14 ENCOUNTER — Ambulatory Visit (INDEPENDENT_AMBULATORY_CARE_PROVIDER_SITE_OTHER): Payer: 59 | Admitting: Student

## 2022-11-14 ENCOUNTER — Encounter: Payer: Self-pay | Admitting: Student

## 2022-11-14 VITALS — BP 111/59 | HR 82 | Temp 97.7°F | Ht 69.0 in | Wt 272.3 lb

## 2022-11-14 DIAGNOSIS — Z7985 Long-term (current) use of injectable non-insulin antidiabetic drugs: Secondary | ICD-10-CM

## 2022-11-14 DIAGNOSIS — Z992 Dependence on renal dialysis: Secondary | ICD-10-CM

## 2022-11-14 DIAGNOSIS — I120 Hypertensive chronic kidney disease with stage 5 chronic kidney disease or end stage renal disease: Secondary | ICD-10-CM | POA: Diagnosis not present

## 2022-11-14 DIAGNOSIS — E1122 Type 2 diabetes mellitus with diabetic chronic kidney disease: Secondary | ICD-10-CM | POA: Diagnosis not present

## 2022-11-14 DIAGNOSIS — F419 Anxiety disorder, unspecified: Secondary | ICD-10-CM

## 2022-11-14 DIAGNOSIS — I25119 Atherosclerotic heart disease of native coronary artery with unspecified angina pectoris: Secondary | ICD-10-CM | POA: Diagnosis not present

## 2022-11-14 DIAGNOSIS — I10 Essential (primary) hypertension: Secondary | ICD-10-CM

## 2022-11-14 DIAGNOSIS — F32A Depression, unspecified: Secondary | ICD-10-CM

## 2022-11-14 DIAGNOSIS — N186 End stage renal disease: Secondary | ICD-10-CM

## 2022-11-14 DIAGNOSIS — E1159 Type 2 diabetes mellitus with other circulatory complications: Secondary | ICD-10-CM

## 2022-11-14 DIAGNOSIS — Z87891 Personal history of nicotine dependence: Secondary | ICD-10-CM

## 2022-11-14 LAB — POCT GLYCOSYLATED HEMOGLOBIN (HGB A1C): Hemoglobin A1C: 6.6 % — AB (ref 4.0–5.6)

## 2022-11-14 LAB — GLUCOSE, CAPILLARY: Glucose-Capillary: 164 mg/dL — ABNORMAL HIGH (ref 70–99)

## 2022-11-14 MED ORDER — MOUNJARO 10 MG/0.5ML ~~LOC~~ SOAJ
10.0000 mg | SUBCUTANEOUS | 0 refills | Status: DC
Start: 2022-11-14 — End: 2023-01-25

## 2022-11-14 NOTE — Assessment & Plan Note (Signed)
A1c was 8.0 in 06/2022.  Today it is 6.6.  She is overdue for her weekly Mounjaro after being in the hospital.  She needs a refill. - Continue Mounjaro 10 mg weekly - Repeat A1c in 3 months

## 2022-11-14 NOTE — Assessment & Plan Note (Signed)
Continues with HD Tuesday, Thursday, and Saturday.  Having issues with hypotension but there does seem to be some confusion in her medications.  She will discuss her medications with her nephrologist at her next visit.  She was also started on vitamin D supplementation in the hospital but may be getting this at dialysis.  She will clarify if she needs to be taking the oral vitamin D.

## 2022-11-14 NOTE — Patient Instructions (Addendum)
  Thank you, [name], for allowing [place] to provide your care today. Today we discussed . . .  > Blood pressure       -Your blood pressure is low today.  Please discontinue lisinopril.  You may also hold a dose of your metoprolol tonight if you are still feeling fatigued.  Please continue taking your amlodipine 10 mg daily.  Please bring this to your dialysis center and discussed your medications with them.  Please also discuss your vitamin D supplementation with your dialysis center.  If they are not giving you vitamin D during your treatments then please take the medication you are prescribed weekly.  If they are giving you vitamin D   I have ordered the following labs for you:   Lab Orders         POC Hbg A1C      Referrals ordered today:   Referral Orders  No referral(s) requested today      I have ordered the following medication/changed the following medications:   Stop the following medications: Medications Discontinued During This Encounter  Medication Reason   tirzepatide [facility]en) 10 MG/0.5ML Pen Reorder     Start the following medications: Meds ordered this encounter  Medications   tirzepatide (MOUNJARO) 10 MG/0.5ML Pen    Sig: Inject 10 mg into the skin once a week.    Dispense:  2 mL    Refill:  0      Follow up: 3 months    Remember:     Should you have any questions or concerns please call the internal medicine clinic at [phone].     Rocky Morel, DO [facility]

## 2022-11-14 NOTE — Assessment & Plan Note (Signed)
Blood pressure today initially low at 96/56 and improved to 111/59 after a small drink and something to eat.  She was recently discharged to the hospital on amlodipine 10 mg nightly and metoprolol succinate 25 mg nightly but she has been taking lisinopril 10 mg nightly at home as well.  This was unknown to [place] and does not appear on her medication list with the last pharmacy fill we have access to was in 2022.  She will see her nephrologist tomorrow at dialysis she thinks. - Stop lisinopril, if still feeling fatigued she will hold metoprolol tonight, but she will continue amlodipine 10 mg nightly - Discuss these medications with her nephrologist

## 2022-11-14 NOTE — Assessment & Plan Note (Signed)
Hospitalized from 10/30/2022 - 11/02/2022 for type II NSTEMI complicated by anxiety and panic attacks.  TTE and left heart cath showed EF of 60 to 65%, no regional wall motion abnormalities, grade 1 diastolic dysfunction, LVH, and diffuse mild nonobstructive coronary artery disease.  No return of her chest pain since discharge.  She was very hypertensive during her chest pain episodes and since discharge, as discussed in hypertension, has had low pressures. - Continue aspirin 81 mg daily, atorvastatin 80 mg daily, metoprolol 25 mg nightly

## 2022-11-14 NOTE — Assessment & Plan Note (Signed)
Doing well without significant symptoms since hospital discharge.  She is using hydroxyzine 25 mg about every other day. - Continue bupropion 100 mg every other day, and hydroxyzine 25 mg 3 times daily as needed

## 2022-11-14 NOTE — Progress Notes (Signed)
CC: Hospital follow-up  HPI:  [name] [name] is a 47 y.o. female with PMH as below who presents to the clinic for a hospital follow-up.  Please see assessment and plan for further details.  Past Medical History:  Diagnosis Date   Abdominal muscle pain 09/08/2016   Abnormal Pap smear of cervix 2009   Abscess    history of multiple abscesses   Acute bilateral low back pain 02/13/2017   Acute blood loss anemia    Hx  Iron infusions   Acute on chronic renal failure (HCC) 07/12/2012   Acute renal failure (HCC) 07/12/2012   Acute respiratory failure with hypoxia (HCC) 10/21/2019   Adjustment disorder with depressed mood 03/26/2017   AKI (acute kidney injury) (HCC)    Dialysis t-th-s   Anemia 10/21/2019   Anemia of chronic disease 2002   Anginal pain (HCC)    Anxiety    Panic attacks   Bilateral lower extremity edema 05/13/2016   Bipolar disorder (HCC)    Cataract    B/L cataract   Cellulitis 05/21/2014   right eye   CHF (congestive heart failure) (HCC)    Chronic bronchitis (HCC)    "get it q yr" (05/13/2013)   Chronic pain    Chronic pain of right knee 09/08/2016   Crutches as ambulation aid    uses forearm crutchs, Left  AKA with prosthetic.   Dehiscence of amputation stump (HCC)    Depression    Edema of lower extremity    Endocarditis 2002   subacute bacterial endocarditis.    Fall    Family history of anesthesia complication    "my mom has a hard time coming out from under"   Fibromyalgia    GERD (gastroesophageal reflux disease)    occ   Heart murmur    never has had any problems   Herpes simplex type 1 infection 01/16/2018   History of blood transfusion    "just low blood count" (05/13/2013)   History of sexually transmitted infection    Hyperlipidemia    Hypertension    Hypothyroidism    Hypothyroidism, adult 03/21/2014   Hypoxia    Leukocytosis    Muscle spasm 12/16/2020   Necrosis (HCC)    and ulceration   Necrotizing fasciitis s/p OR  debridements 07/06/2012   Obesity    OSA on CPAP    does not use CPAP   Peripheral neuropathy    Pneumonia    Pyelonephritis 12/31/2020   Routine adult health maintenance 12/08/2016   Severe protein-calorie malnutrition (HCC)    Status post below knee amputation, left (HCC) 04/11/2017   Type II diabetes mellitus (HCC)    Type  II   Vaginal irritation 03/14/2016   Weak pulse 07/21/2021   Wound dehiscence 09/04/2019   Review of Systems:   Pertinent items noted in HPI and/or A&P.  Physical Exam:  Vitals:   11/14/22 0942 11/14/22 1005  BP: (!) 96/56 (!) 111/59  Pulse: 88 82  Temp: 97.7 F (36.5 C)   TempSrc: Oral   Weight: 272 lb 4.8 oz (123.5 kg)   Height: 5\' 9"  (1.753 m)     Constitutional: Well-appearing middle-aged female. In no acute distress. Cardio:Regular rate and rhythm. 2+ bilateral radial pulses.  Palpable thrill over left upper extremity AV fistula. Pulm:Clear to auscultation bilaterally. Normal work of breathing on room air. Abdomen: Soft, non-tender, non-distended, positive bowel sounds. ZOX:WRUEAVWU for extremity edema. Skin:Warm and dry. Neuro:Alert and oriented x3. No focal deficit noted. Psych:Pleasant  mood and affect.   Assessment & Plan:   Primary hypertension Blood pressure today initially low at 96/56 and improved to 111/59 after a small drink and something to eat.  She was recently discharged to the hospital on amlodipine 10 mg nightly and metoprolol succinate 25 mg nightly but she has been taking lisinopril 10 mg nightly at home as well.  This was unknown to [place] and does not appear on her medication list with the last pharmacy fill we have access to was in 2022.  She will see her nephrologist tomorrow at dialysis she thinks. - Stop lisinopril, if still feeling fatigued she will hold metoprolol tonight, but she will continue amlodipine 10 mg nightly - Discuss these medications with her nephrologist  Type 2 diabetes mellitus (HCC) A1c was 8.0 in  06/2022.  Today it is 6.6.  She is overdue for her weekly Mounjaro after being in the hospital.  She needs a refill. - Continue Mounjaro 10 mg weekly - Repeat A1c in 3 months  CAD (coronary artery disease) Hospitalized from 10/30/2022 - 11/02/2022 for type II NSTEMI complicated by anxiety and panic attacks.  TTE and left heart cath showed EF of 60 to 65%, no regional wall motion abnormalities, grade 1 diastolic dysfunction, LVH, and diffuse mild nonobstructive coronary artery disease.  No return of her chest pain since discharge.  She was very hypertensive during her chest pain episodes and since discharge, as discussed in hypertension, has had low pressures. - Continue aspirin 81 mg daily, atorvastatin 80 mg daily, metoprolol 25 mg nightly  Anxiety and depression Doing well without significant symptoms since hospital discharge.  She is using hydroxyzine 25 mg about every other day. - Continue bupropion 100 mg every other day, and hydroxyzine 25 mg 3 times daily as needed  ESRD (end stage renal disease) (HCC) Continues with HD Tuesday, Thursday, and Saturday.  Having issues with hypotension but there does seem to be some confusion in her medications.  She will discuss her medications with her nephrologist at her next visit.  She was also started on vitamin D supplementation in the hospital but may be getting this at dialysis.  She will clarify if she needs to be taking the oral vitamin D.    Patient discussed with Dr. Dimas Chyle, DO [facility] Internal Medicine Resident PGY-1 Pager: [phone]

## 2022-11-15 ENCOUNTER — Other Ambulatory Visit: Payer: Self-pay | Admitting: Student

## 2022-11-15 DIAGNOSIS — D689 Coagulation defect, unspecified: Secondary | ICD-10-CM | POA: Diagnosis not present

## 2022-11-15 DIAGNOSIS — Z1231 Encounter for screening mammogram for malignant neoplasm of breast: Secondary | ICD-10-CM

## 2022-11-15 DIAGNOSIS — N2581 Secondary hyperparathyroidism of renal origin: Secondary | ICD-10-CM | POA: Diagnosis not present

## 2022-11-15 DIAGNOSIS — Z992 Dependence on renal dialysis: Secondary | ICD-10-CM | POA: Diagnosis not present

## 2022-11-15 DIAGNOSIS — N186 End stage renal disease: Secondary | ICD-10-CM | POA: Diagnosis not present

## 2022-11-15 DIAGNOSIS — D509 Iron deficiency anemia, unspecified: Secondary | ICD-10-CM | POA: Diagnosis not present

## 2022-11-15 DIAGNOSIS — D631 Anemia in chronic kidney disease: Secondary | ICD-10-CM | POA: Diagnosis not present

## 2022-11-15 DIAGNOSIS — L299 Pruritus, unspecified: Secondary | ICD-10-CM | POA: Diagnosis not present

## 2022-11-15 DIAGNOSIS — R197 Diarrhea, unspecified: Secondary | ICD-10-CM | POA: Diagnosis not present

## 2022-11-17 DIAGNOSIS — N186 End stage renal disease: Secondary | ICD-10-CM | POA: Diagnosis not present

## 2022-11-17 DIAGNOSIS — N2581 Secondary hyperparathyroidism of renal origin: Secondary | ICD-10-CM | POA: Diagnosis not present

## 2022-11-17 DIAGNOSIS — Z992 Dependence on renal dialysis: Secondary | ICD-10-CM | POA: Diagnosis not present

## 2022-11-17 DIAGNOSIS — D689 Coagulation defect, unspecified: Secondary | ICD-10-CM | POA: Diagnosis not present

## 2022-11-17 DIAGNOSIS — D631 Anemia in chronic kidney disease: Secondary | ICD-10-CM | POA: Diagnosis not present

## 2022-11-17 DIAGNOSIS — D509 Iron deficiency anemia, unspecified: Secondary | ICD-10-CM | POA: Diagnosis not present

## 2022-11-17 DIAGNOSIS — L299 Pruritus, unspecified: Secondary | ICD-10-CM | POA: Diagnosis not present

## 2022-11-17 DIAGNOSIS — R197 Diarrhea, unspecified: Secondary | ICD-10-CM | POA: Diagnosis not present

## 2022-11-19 DIAGNOSIS — D689 Coagulation defect, unspecified: Secondary | ICD-10-CM | POA: Diagnosis not present

## 2022-11-19 DIAGNOSIS — N186 End stage renal disease: Secondary | ICD-10-CM | POA: Diagnosis not present

## 2022-11-19 DIAGNOSIS — N2581 Secondary hyperparathyroidism of renal origin: Secondary | ICD-10-CM | POA: Diagnosis not present

## 2022-11-19 DIAGNOSIS — R197 Diarrhea, unspecified: Secondary | ICD-10-CM | POA: Diagnosis not present

## 2022-11-19 DIAGNOSIS — D509 Iron deficiency anemia, unspecified: Secondary | ICD-10-CM | POA: Diagnosis not present

## 2022-11-19 DIAGNOSIS — D631 Anemia in chronic kidney disease: Secondary | ICD-10-CM | POA: Diagnosis not present

## 2022-11-19 DIAGNOSIS — L299 Pruritus, unspecified: Secondary | ICD-10-CM | POA: Diagnosis not present

## 2022-11-19 DIAGNOSIS — Z992 Dependence on renal dialysis: Secondary | ICD-10-CM | POA: Diagnosis not present

## 2022-11-20 DIAGNOSIS — E1122 Type 2 diabetes mellitus with diabetic chronic kidney disease: Secondary | ICD-10-CM | POA: Diagnosis not present

## 2022-11-20 DIAGNOSIS — N186 End stage renal disease: Secondary | ICD-10-CM | POA: Diagnosis not present

## 2022-11-20 DIAGNOSIS — Z992 Dependence on renal dialysis: Secondary | ICD-10-CM | POA: Diagnosis not present

## 2022-11-22 DIAGNOSIS — D509 Iron deficiency anemia, unspecified: Secondary | ICD-10-CM | POA: Diagnosis not present

## 2022-11-22 DIAGNOSIS — E1122 Type 2 diabetes mellitus with diabetic chronic kidney disease: Secondary | ICD-10-CM | POA: Diagnosis not present

## 2022-11-22 DIAGNOSIS — D689 Coagulation defect, unspecified: Secondary | ICD-10-CM | POA: Diagnosis not present

## 2022-11-22 DIAGNOSIS — R52 Pain, unspecified: Secondary | ICD-10-CM | POA: Diagnosis not present

## 2022-11-22 DIAGNOSIS — D631 Anemia in chronic kidney disease: Secondary | ICD-10-CM | POA: Diagnosis not present

## 2022-11-22 DIAGNOSIS — N186 End stage renal disease: Secondary | ICD-10-CM | POA: Diagnosis not present

## 2022-11-22 DIAGNOSIS — Z992 Dependence on renal dialysis: Secondary | ICD-10-CM | POA: Diagnosis not present

## 2022-11-22 DIAGNOSIS — N2581 Secondary hyperparathyroidism of renal origin: Secondary | ICD-10-CM | POA: Diagnosis not present

## 2022-11-23 NOTE — Progress Notes (Signed)
Internal Medicine Clinic Attending  Case discussed with Dr. Goodwin  at the time of the visit.  We reviewed the resident's history and exam and pertinent patient test results.  I agree with the assessment, diagnosis, and plan of care documented in the resident's note.  

## 2022-11-23 NOTE — Addendum Note (Signed)
Addended by: Debe Coder B on: 11/23/2022 03:53 PM   Modules accepted: Level of Service

## 2022-11-24 DIAGNOSIS — D509 Iron deficiency anemia, unspecified: Secondary | ICD-10-CM | POA: Diagnosis not present

## 2022-11-24 DIAGNOSIS — Z992 Dependence on renal dialysis: Secondary | ICD-10-CM | POA: Diagnosis not present

## 2022-11-24 DIAGNOSIS — N2581 Secondary hyperparathyroidism of renal origin: Secondary | ICD-10-CM | POA: Diagnosis not present

## 2022-11-24 DIAGNOSIS — D689 Coagulation defect, unspecified: Secondary | ICD-10-CM | POA: Diagnosis not present

## 2022-11-24 DIAGNOSIS — E1122 Type 2 diabetes mellitus with diabetic chronic kidney disease: Secondary | ICD-10-CM | POA: Diagnosis not present

## 2022-11-24 DIAGNOSIS — N186 End stage renal disease: Secondary | ICD-10-CM | POA: Diagnosis not present

## 2022-11-24 DIAGNOSIS — R52 Pain, unspecified: Secondary | ICD-10-CM | POA: Diagnosis not present

## 2022-11-24 DIAGNOSIS — D631 Anemia in chronic kidney disease: Secondary | ICD-10-CM | POA: Diagnosis not present

## 2022-11-26 DIAGNOSIS — R52 Pain, unspecified: Secondary | ICD-10-CM | POA: Diagnosis not present

## 2022-11-26 DIAGNOSIS — D631 Anemia in chronic kidney disease: Secondary | ICD-10-CM | POA: Diagnosis not present

## 2022-11-26 DIAGNOSIS — N186 End stage renal disease: Secondary | ICD-10-CM | POA: Diagnosis not present

## 2022-11-26 DIAGNOSIS — E1122 Type 2 diabetes mellitus with diabetic chronic kidney disease: Secondary | ICD-10-CM | POA: Diagnosis not present

## 2022-11-26 DIAGNOSIS — D689 Coagulation defect, unspecified: Secondary | ICD-10-CM | POA: Diagnosis not present

## 2022-11-26 DIAGNOSIS — N2581 Secondary hyperparathyroidism of renal origin: Secondary | ICD-10-CM | POA: Diagnosis not present

## 2022-11-26 DIAGNOSIS — D509 Iron deficiency anemia, unspecified: Secondary | ICD-10-CM | POA: Diagnosis not present

## 2022-11-26 DIAGNOSIS — Z992 Dependence on renal dialysis: Secondary | ICD-10-CM | POA: Diagnosis not present

## 2022-11-29 DIAGNOSIS — Z992 Dependence on renal dialysis: Secondary | ICD-10-CM | POA: Diagnosis not present

## 2022-11-29 DIAGNOSIS — D631 Anemia in chronic kidney disease: Secondary | ICD-10-CM | POA: Diagnosis not present

## 2022-11-29 DIAGNOSIS — N2581 Secondary hyperparathyroidism of renal origin: Secondary | ICD-10-CM | POA: Diagnosis not present

## 2022-11-29 DIAGNOSIS — E1122 Type 2 diabetes mellitus with diabetic chronic kidney disease: Secondary | ICD-10-CM | POA: Diagnosis not present

## 2022-11-29 DIAGNOSIS — D509 Iron deficiency anemia, unspecified: Secondary | ICD-10-CM | POA: Diagnosis not present

## 2022-11-29 DIAGNOSIS — D689 Coagulation defect, unspecified: Secondary | ICD-10-CM | POA: Diagnosis not present

## 2022-11-29 DIAGNOSIS — R52 Pain, unspecified: Secondary | ICD-10-CM | POA: Diagnosis not present

## 2022-11-29 DIAGNOSIS — N186 End stage renal disease: Secondary | ICD-10-CM | POA: Diagnosis not present

## 2022-11-30 ENCOUNTER — Telehealth: Payer: Self-pay | Admitting: *Deleted

## 2022-11-30 DIAGNOSIS — Z89612 Acquired absence of left leg above knee: Secondary | ICD-10-CM

## 2022-11-30 NOTE — Telephone Encounter (Signed)
RTC to patient is requesting an order for a new Socket for her Prosthesis.  Will forward order to PCP and Team.  Please send message to [place] to notify her that the order has been written.

## 2022-12-01 DIAGNOSIS — E1122 Type 2 diabetes mellitus with diabetic chronic kidney disease: Secondary | ICD-10-CM | POA: Diagnosis not present

## 2022-12-01 DIAGNOSIS — N186 End stage renal disease: Secondary | ICD-10-CM | POA: Diagnosis not present

## 2022-12-01 DIAGNOSIS — D689 Coagulation defect, unspecified: Secondary | ICD-10-CM | POA: Diagnosis not present

## 2022-12-01 DIAGNOSIS — D631 Anemia in chronic kidney disease: Secondary | ICD-10-CM | POA: Diagnosis not present

## 2022-12-01 DIAGNOSIS — D509 Iron deficiency anemia, unspecified: Secondary | ICD-10-CM | POA: Diagnosis not present

## 2022-12-01 DIAGNOSIS — N2581 Secondary hyperparathyroidism of renal origin: Secondary | ICD-10-CM | POA: Diagnosis not present

## 2022-12-01 DIAGNOSIS — Z992 Dependence on renal dialysis: Secondary | ICD-10-CM | POA: Diagnosis not present

## 2022-12-01 DIAGNOSIS — R52 Pain, unspecified: Secondary | ICD-10-CM | POA: Diagnosis not present

## 2022-12-03 DIAGNOSIS — D509 Iron deficiency anemia, unspecified: Secondary | ICD-10-CM | POA: Diagnosis not present

## 2022-12-03 DIAGNOSIS — E1122 Type 2 diabetes mellitus with diabetic chronic kidney disease: Secondary | ICD-10-CM | POA: Diagnosis not present

## 2022-12-03 DIAGNOSIS — Z992 Dependence on renal dialysis: Secondary | ICD-10-CM | POA: Diagnosis not present

## 2022-12-03 DIAGNOSIS — N2581 Secondary hyperparathyroidism of renal origin: Secondary | ICD-10-CM | POA: Diagnosis not present

## 2022-12-03 DIAGNOSIS — R52 Pain, unspecified: Secondary | ICD-10-CM | POA: Diagnosis not present

## 2022-12-03 DIAGNOSIS — N186 End stage renal disease: Secondary | ICD-10-CM | POA: Diagnosis not present

## 2022-12-03 DIAGNOSIS — D689 Coagulation defect, unspecified: Secondary | ICD-10-CM | POA: Diagnosis not present

## 2022-12-03 DIAGNOSIS — D631 Anemia in chronic kidney disease: Secondary | ICD-10-CM | POA: Diagnosis not present

## 2022-12-05 DIAGNOSIS — G890 Central pain syndrome: Secondary | ICD-10-CM | POA: Diagnosis not present

## 2022-12-05 DIAGNOSIS — G546 Phantom limb syndrome with pain: Secondary | ICD-10-CM | POA: Diagnosis not present

## 2022-12-05 DIAGNOSIS — Z79899 Other long term (current) drug therapy: Secondary | ICD-10-CM | POA: Diagnosis not present

## 2022-12-05 DIAGNOSIS — M545 Low back pain, unspecified: Secondary | ICD-10-CM | POA: Diagnosis not present

## 2022-12-05 DIAGNOSIS — G894 Chronic pain syndrome: Secondary | ICD-10-CM | POA: Diagnosis not present

## 2022-12-05 DIAGNOSIS — Z992 Dependence on renal dialysis: Secondary | ICD-10-CM | POA: Diagnosis not present

## 2022-12-05 DIAGNOSIS — Z79891 Long term (current) use of opiate analgesic: Secondary | ICD-10-CM | POA: Diagnosis not present

## 2022-12-05 DIAGNOSIS — M25519 Pain in unspecified shoulder: Secondary | ICD-10-CM | POA: Diagnosis not present

## 2022-12-05 DIAGNOSIS — M79643 Pain in unspecified hand: Secondary | ICD-10-CM | POA: Diagnosis not present

## 2022-12-05 DIAGNOSIS — N186 End stage renal disease: Secondary | ICD-10-CM | POA: Diagnosis not present

## 2022-12-05 DIAGNOSIS — G4733 Obstructive sleep apnea (adult) (pediatric): Secondary | ICD-10-CM | POA: Diagnosis not present

## 2022-12-05 DIAGNOSIS — M25562 Pain in left knee: Secondary | ICD-10-CM | POA: Diagnosis not present

## 2022-12-05 DIAGNOSIS — I159 Secondary hypertension, unspecified: Secondary | ICD-10-CM | POA: Diagnosis not present

## 2022-12-06 DIAGNOSIS — R52 Pain, unspecified: Secondary | ICD-10-CM | POA: Diagnosis not present

## 2022-12-06 DIAGNOSIS — D631 Anemia in chronic kidney disease: Secondary | ICD-10-CM | POA: Diagnosis not present

## 2022-12-06 DIAGNOSIS — Z992 Dependence on renal dialysis: Secondary | ICD-10-CM | POA: Diagnosis not present

## 2022-12-06 DIAGNOSIS — N186 End stage renal disease: Secondary | ICD-10-CM | POA: Diagnosis not present

## 2022-12-06 DIAGNOSIS — E1122 Type 2 diabetes mellitus with diabetic chronic kidney disease: Secondary | ICD-10-CM | POA: Diagnosis not present

## 2022-12-06 DIAGNOSIS — D689 Coagulation defect, unspecified: Secondary | ICD-10-CM | POA: Diagnosis not present

## 2022-12-06 DIAGNOSIS — N2581 Secondary hyperparathyroidism of renal origin: Secondary | ICD-10-CM | POA: Diagnosis not present

## 2022-12-06 DIAGNOSIS — D509 Iron deficiency anemia, unspecified: Secondary | ICD-10-CM | POA: Diagnosis not present

## 2022-12-08 DIAGNOSIS — N2581 Secondary hyperparathyroidism of renal origin: Secondary | ICD-10-CM | POA: Diagnosis not present

## 2022-12-08 DIAGNOSIS — D689 Coagulation defect, unspecified: Secondary | ICD-10-CM | POA: Diagnosis not present

## 2022-12-08 DIAGNOSIS — Z992 Dependence on renal dialysis: Secondary | ICD-10-CM | POA: Diagnosis not present

## 2022-12-08 DIAGNOSIS — D631 Anemia in chronic kidney disease: Secondary | ICD-10-CM | POA: Diagnosis not present

## 2022-12-08 DIAGNOSIS — D509 Iron deficiency anemia, unspecified: Secondary | ICD-10-CM | POA: Diagnosis not present

## 2022-12-08 DIAGNOSIS — R52 Pain, unspecified: Secondary | ICD-10-CM | POA: Diagnosis not present

## 2022-12-08 DIAGNOSIS — E1122 Type 2 diabetes mellitus with diabetic chronic kidney disease: Secondary | ICD-10-CM | POA: Diagnosis not present

## 2022-12-08 DIAGNOSIS — N186 End stage renal disease: Secondary | ICD-10-CM | POA: Diagnosis not present

## 2022-12-10 DIAGNOSIS — D509 Iron deficiency anemia, unspecified: Secondary | ICD-10-CM | POA: Diagnosis not present

## 2022-12-10 DIAGNOSIS — N2581 Secondary hyperparathyroidism of renal origin: Secondary | ICD-10-CM | POA: Diagnosis not present

## 2022-12-10 DIAGNOSIS — N186 End stage renal disease: Secondary | ICD-10-CM | POA: Diagnosis not present

## 2022-12-10 DIAGNOSIS — E1122 Type 2 diabetes mellitus with diabetic chronic kidney disease: Secondary | ICD-10-CM | POA: Diagnosis not present

## 2022-12-10 DIAGNOSIS — D689 Coagulation defect, unspecified: Secondary | ICD-10-CM | POA: Diagnosis not present

## 2022-12-10 DIAGNOSIS — D631 Anemia in chronic kidney disease: Secondary | ICD-10-CM | POA: Diagnosis not present

## 2022-12-10 DIAGNOSIS — Z992 Dependence on renal dialysis: Secondary | ICD-10-CM | POA: Diagnosis not present

## 2022-12-10 DIAGNOSIS — R52 Pain, unspecified: Secondary | ICD-10-CM | POA: Diagnosis not present

## 2022-12-13 DIAGNOSIS — N186 End stage renal disease: Secondary | ICD-10-CM | POA: Diagnosis not present

## 2022-12-13 DIAGNOSIS — D631 Anemia in chronic kidney disease: Secondary | ICD-10-CM | POA: Diagnosis not present

## 2022-12-13 DIAGNOSIS — Z992 Dependence on renal dialysis: Secondary | ICD-10-CM | POA: Diagnosis not present

## 2022-12-13 DIAGNOSIS — E1122 Type 2 diabetes mellitus with diabetic chronic kidney disease: Secondary | ICD-10-CM | POA: Diagnosis not present

## 2022-12-13 DIAGNOSIS — D689 Coagulation defect, unspecified: Secondary | ICD-10-CM | POA: Diagnosis not present

## 2022-12-13 DIAGNOSIS — N2581 Secondary hyperparathyroidism of renal origin: Secondary | ICD-10-CM | POA: Diagnosis not present

## 2022-12-13 DIAGNOSIS — R52 Pain, unspecified: Secondary | ICD-10-CM | POA: Diagnosis not present

## 2022-12-13 DIAGNOSIS — D509 Iron deficiency anemia, unspecified: Secondary | ICD-10-CM | POA: Diagnosis not present

## 2022-12-15 DIAGNOSIS — R52 Pain, unspecified: Secondary | ICD-10-CM | POA: Diagnosis not present

## 2022-12-15 DIAGNOSIS — Z992 Dependence on renal dialysis: Secondary | ICD-10-CM | POA: Diagnosis not present

## 2022-12-15 DIAGNOSIS — E1122 Type 2 diabetes mellitus with diabetic chronic kidney disease: Secondary | ICD-10-CM | POA: Diagnosis not present

## 2022-12-15 DIAGNOSIS — D631 Anemia in chronic kidney disease: Secondary | ICD-10-CM | POA: Diagnosis not present

## 2022-12-15 DIAGNOSIS — N2581 Secondary hyperparathyroidism of renal origin: Secondary | ICD-10-CM | POA: Diagnosis not present

## 2022-12-15 DIAGNOSIS — D689 Coagulation defect, unspecified: Secondary | ICD-10-CM | POA: Diagnosis not present

## 2022-12-15 DIAGNOSIS — D509 Iron deficiency anemia, unspecified: Secondary | ICD-10-CM | POA: Diagnosis not present

## 2022-12-15 DIAGNOSIS — N186 End stage renal disease: Secondary | ICD-10-CM | POA: Diagnosis not present

## 2022-12-16 ENCOUNTER — Ambulatory Visit
Admission: RE | Admit: 2022-12-16 | Discharge: 2022-12-16 | Disposition: A | Discharge status: Home / Self Care | Payer: 59 | Source: Ambulatory Visit | Attending: Internal Medicine | Admitting: Internal Medicine

## 2022-12-16 DIAGNOSIS — Z1231 Encounter for screening mammogram for malignant neoplasm of breast: Secondary | ICD-10-CM | POA: Diagnosis not present

## 2022-12-17 DIAGNOSIS — E1122 Type 2 diabetes mellitus with diabetic chronic kidney disease: Secondary | ICD-10-CM | POA: Diagnosis not present

## 2022-12-17 DIAGNOSIS — Z992 Dependence on renal dialysis: Secondary | ICD-10-CM | POA: Diagnosis not present

## 2022-12-17 DIAGNOSIS — R52 Pain, unspecified: Secondary | ICD-10-CM | POA: Diagnosis not present

## 2022-12-17 DIAGNOSIS — D631 Anemia in chronic kidney disease: Secondary | ICD-10-CM | POA: Diagnosis not present

## 2022-12-17 DIAGNOSIS — D509 Iron deficiency anemia, unspecified: Secondary | ICD-10-CM | POA: Diagnosis not present

## 2022-12-17 DIAGNOSIS — D689 Coagulation defect, unspecified: Secondary | ICD-10-CM | POA: Diagnosis not present

## 2022-12-17 DIAGNOSIS — N2581 Secondary hyperparathyroidism of renal origin: Secondary | ICD-10-CM | POA: Diagnosis not present

## 2022-12-17 DIAGNOSIS — N186 End stage renal disease: Secondary | ICD-10-CM | POA: Diagnosis not present

## 2022-12-19 ENCOUNTER — Encounter: Payer: Self-pay | Admitting: Student

## 2022-12-19 NOTE — Progress Notes (Signed)
A letter with the negative mammogram results will be sent to the patient.

## 2022-12-20 DIAGNOSIS — Z992 Dependence on renal dialysis: Secondary | ICD-10-CM | POA: Diagnosis not present

## 2022-12-20 DIAGNOSIS — D689 Coagulation defect, unspecified: Secondary | ICD-10-CM | POA: Diagnosis not present

## 2022-12-20 DIAGNOSIS — R52 Pain, unspecified: Secondary | ICD-10-CM | POA: Diagnosis not present

## 2022-12-20 DIAGNOSIS — N186 End stage renal disease: Secondary | ICD-10-CM | POA: Diagnosis not present

## 2022-12-20 DIAGNOSIS — E1122 Type 2 diabetes mellitus with diabetic chronic kidney disease: Secondary | ICD-10-CM | POA: Diagnosis not present

## 2022-12-20 DIAGNOSIS — D509 Iron deficiency anemia, unspecified: Secondary | ICD-10-CM | POA: Diagnosis not present

## 2022-12-20 DIAGNOSIS — N2581 Secondary hyperparathyroidism of renal origin: Secondary | ICD-10-CM | POA: Diagnosis not present

## 2022-12-20 DIAGNOSIS — D631 Anemia in chronic kidney disease: Secondary | ICD-10-CM | POA: Diagnosis not present

## 2022-12-21 DIAGNOSIS — Z992 Dependence on renal dialysis: Secondary | ICD-10-CM | POA: Diagnosis not present

## 2022-12-21 DIAGNOSIS — E1122 Type 2 diabetes mellitus with diabetic chronic kidney disease: Secondary | ICD-10-CM | POA: Diagnosis not present

## 2022-12-21 DIAGNOSIS — N186 End stage renal disease: Secondary | ICD-10-CM | POA: Diagnosis not present

## 2022-12-22 DIAGNOSIS — D689 Coagulation defect, unspecified: Secondary | ICD-10-CM | POA: Diagnosis not present

## 2022-12-22 DIAGNOSIS — Z992 Dependence on renal dialysis: Secondary | ICD-10-CM | POA: Diagnosis not present

## 2022-12-22 DIAGNOSIS — D509 Iron deficiency anemia, unspecified: Secondary | ICD-10-CM | POA: Diagnosis not present

## 2022-12-22 DIAGNOSIS — L299 Pruritus, unspecified: Secondary | ICD-10-CM | POA: Diagnosis not present

## 2022-12-22 DIAGNOSIS — D631 Anemia in chronic kidney disease: Secondary | ICD-10-CM | POA: Diagnosis not present

## 2022-12-22 DIAGNOSIS — N186 End stage renal disease: Secondary | ICD-10-CM | POA: Diagnosis not present

## 2022-12-22 DIAGNOSIS — N2581 Secondary hyperparathyroidism of renal origin: Secondary | ICD-10-CM | POA: Diagnosis not present

## 2022-12-24 DIAGNOSIS — D509 Iron deficiency anemia, unspecified: Secondary | ICD-10-CM | POA: Diagnosis not present

## 2022-12-24 DIAGNOSIS — N186 End stage renal disease: Secondary | ICD-10-CM | POA: Diagnosis not present

## 2022-12-24 DIAGNOSIS — N2581 Secondary hyperparathyroidism of renal origin: Secondary | ICD-10-CM | POA: Diagnosis not present

## 2022-12-24 DIAGNOSIS — L299 Pruritus, unspecified: Secondary | ICD-10-CM | POA: Diagnosis not present

## 2022-12-24 DIAGNOSIS — Z992 Dependence on renal dialysis: Secondary | ICD-10-CM | POA: Diagnosis not present

## 2022-12-24 DIAGNOSIS — D631 Anemia in chronic kidney disease: Secondary | ICD-10-CM | POA: Diagnosis not present

## 2022-12-24 DIAGNOSIS — D689 Coagulation defect, unspecified: Secondary | ICD-10-CM | POA: Diagnosis not present

## 2022-12-27 DIAGNOSIS — D631 Anemia in chronic kidney disease: Secondary | ICD-10-CM | POA: Diagnosis not present

## 2022-12-27 DIAGNOSIS — Z992 Dependence on renal dialysis: Secondary | ICD-10-CM | POA: Diagnosis not present

## 2022-12-27 DIAGNOSIS — D509 Iron deficiency anemia, unspecified: Secondary | ICD-10-CM | POA: Diagnosis not present

## 2022-12-27 DIAGNOSIS — N186 End stage renal disease: Secondary | ICD-10-CM | POA: Diagnosis not present

## 2022-12-27 DIAGNOSIS — L299 Pruritus, unspecified: Secondary | ICD-10-CM | POA: Diagnosis not present

## 2022-12-27 DIAGNOSIS — D689 Coagulation defect, unspecified: Secondary | ICD-10-CM | POA: Diagnosis not present

## 2022-12-27 DIAGNOSIS — N2581 Secondary hyperparathyroidism of renal origin: Secondary | ICD-10-CM | POA: Diagnosis not present

## 2022-12-28 ENCOUNTER — Ambulatory Visit (INDEPENDENT_AMBULATORY_CARE_PROVIDER_SITE_OTHER): Payer: 59

## 2022-12-28 VITALS — Ht 69.5 in | Wt 247.0 lb

## 2022-12-28 DIAGNOSIS — Z0000 Encounter for general adult medical examination without abnormal findings: Secondary | ICD-10-CM | POA: Diagnosis not present

## 2022-12-28 NOTE — Progress Notes (Signed)
Subjective:   [name] [name] is a 47 y.o. female who presents for Medicare Annual (Subsequent) preventive examination.  Visit Complete: Virtual  I connected with  Maudie Mercury on 12/28/22 by a audio enabled telemedicine application and verified that I am speaking with the correct person using two identifiers.  Patient Location: Home  Provider Location: Home Office  I discussed the limitations of evaluation and management by telemedicine. The patient expressed understanding and agreed to proceed.  Vital Signs: Vital signs are patient reported.    Review of Systems    Cardiac Risk Factors include: advanced age (>36men, >58 women);hypertension;diabetes mellitus;dyslipidemia;obesity (BMI >30kg/m2);Other (see comment), Risk factor comments: CAD, HF, ESRD     Objective:    Today's Vitals   12/28/22 1435 12/28/22 1436  Weight: 247 lb (112 kg)   Height: 5' 9.5" (1.765 m)   PainSc:  3    Body mass index is 35.95 kg/m.     12/28/2022    2:47 PM 11/14/2022    9:44 AM 10/31/2022    5:00 AM 10/30/2022   11:32 AM 07/20/2022    3:39 PM 02/18/2022    9:37 AM 12/06/2021   12:19 PM  Advanced Directives  Does Patient Have a Medical Advance Directive? No No No No No No No  Would patient like information on creating a medical advance directive? No - Patient declined No - Patient declined No - Patient declined  No - Patient declined No - Patient declined No - Patient declined    Current Medications (verified) Outpatient Encounter Medications as of 12/28/2022  Medication Sig   allopurinol (ZYLOPRIM) 100 MG tablet Take 1 tablet by mouth 3 times a week. Take only after dialysis sessions. Do not take on non-dialysis days. (Patient taking differently: Take 100 mg by mouth 3 (three) times a week. Take only after dialysis sessions. Do not take on non-dialysis days.)   amLODipine (NORVASC) 10 MG tablet TAKE 1 TABLET BY MOUTH EVERY DAY   aspirin 81 MG chewable tablet Chew 1 tablet (81 mg total) by  mouth daily.   atorvastatin (LIPITOR) 80 MG tablet Take 1 tablet (80 mg total) by mouth daily.   Benzoyl Peroxide 2.5 % CREA Apply 1 drop topically daily.   blood glucose meter kit and supplies KIT ICD 10- E11.69. Based on patient and insurance preference. Use up to four times daily as directed.   buPROPion (WELLBUTRIN) 100 MG tablet Take 1 tablet (100 mg total) by mouth every other day.   diclofenac Sodium (VOLTAREN) 1 % GEL APPLY 1 GRAM TOPICALLY TO AFFECTED AREA 4 TIMES DAILY AS NEEDED (Patient taking differently: Apply 1 g topically 4 (four) times daily as needed (for pain).)   fluticasone (FLONASE) 50 MCG/ACT nasal spray Place 1 spray into both nostrils daily. (Patient taking differently: Place 1 spray into both nostrils daily as needed for allergies.)   furosemide (LASIX) 20 MG tablet Take 1 tablet by mouth daily. Take on non-dialysis days. Adjust per kidney docs (Patient taking differently: Take 20 mg by mouth daily. Take on non-dialysis days. Adjust per kidney docs)   hydrOXYzine (ATARAX) 25 MG tablet Take 1 tablet (25 mg total) by mouth 3 (three) times daily as needed for anxiety.   Insulin Pen Needle (TRUEPLUS PEN NEEDLES) 31G X 5 MM MISC 1 each by Other route 3 (three) times daily.   ketoconazole (NIZORAL) 2 % cream Apply 1 application topically to affected area daily.   levothyroxine (SYNTHROID) 88 MCG tablet Take 88 mcg by mouth  daily.   metoprolol succinate (TOPROL-XL) 25 MG 24 hr tablet TAKE 1 TABLET BY MOUTH EVERY DAY   ofloxacin (OCUFLOX) 0.3 % ophthalmic solution Place 1 drop into the left eye in the morning and at bedtime.   oxyCODONE-acetaminophen (PERCOCET) 7.5-325 MG tablet Take 1 tablet by mouth every 8 (eight) hours as needed for moderate pain.   tirzepatide (MOUNJARO) 10 MG/0.5ML Pen Inject 10 mg into the skin once a week.   tretinoin (RETIN-A) 0.025 % cream Apply topically at bedtime.   Vitamin D, Ergocalciferol, (DRISDOL) 1.25 MG (50000 UNIT) CAPS capsule Take 1 capsule  (50,000 Units total) by mouth every 7 (seven) days.   cyclobenzaprine (FLEXERIL) 5 MG tablet Take 1 tablet by mouth daily as needed for muscle spasms. (Patient not taking: Reported on 12/28/2022)   sevelamer carbonate (RENVELA) 800 MG tablet Take 6 to 7 tablets by mouth twice a day with meals (Patient not taking: Reported on 12/28/2022)   No facility-administered encounter medications on file as of 12/28/2022.    Allergies (verified) Vancomycin   History: Past Medical History:  Diagnosis Date   Abdominal muscle pain 09/08/2016   Abnormal Pap smear of cervix 2009   Abscess    history of multiple abscesses   Acute bilateral low back pain 02/13/2017   Acute blood loss anemia    Hx  Iron infusions   Acute on chronic renal failure (HCC) 07/12/2012   Acute renal failure (HCC) 07/12/2012   Acute respiratory failure with hypoxia (HCC) 10/21/2019   Adjustment disorder with depressed mood 03/26/2017   AKI (acute kidney injury) (HCC)    Dialysis t-th-s   Anemia 10/21/2019   Anemia of chronic disease 2002   Anginal pain (HCC)    Anxiety    Panic attacks   Bilateral lower extremity edema 05/13/2016   Bipolar disorder (HCC)    Cataract    B/L cataract   Cellulitis 05/21/2014   right eye   CHF (congestive heart failure) (HCC)    Chronic bronchitis (HCC)    "get it q yr" (05/13/2013)   Chronic pain    Chronic pain of right knee 09/08/2016   Crutches as ambulation aid    uses forearm crutchs, Left  AKA with prosthetic.   Dehiscence of amputation stump (HCC)    Depression    Edema of lower extremity    Endocarditis 2002   subacute bacterial endocarditis.    Fall    Family history of anesthesia complication    "my mom has a hard time coming out from under"   Fibromyalgia    GERD (gastroesophageal reflux disease)    occ   Heart murmur    never has had any problems   Herpes simplex type 1 infection 01/16/2018   History of blood transfusion    "just low blood count" (05/13/2013)    History of sexually transmitted infection    Hyperlipidemia    Hypertension    Hypothyroidism    Hypothyroidism, adult 03/21/2014   Hypoxia    Leukocytosis    Muscle spasm 12/16/2020   Necrosis (HCC)    and ulceration   Necrotizing fasciitis s/p OR debridements 07/06/2012   Obesity    OSA on CPAP    does not use CPAP   Peripheral neuropathy    Pneumonia    Pyelonephritis 12/31/2020   Routine adult health maintenance 12/08/2016   Severe protein-calorie malnutrition (HCC)    Status post below knee amputation, left (HCC) 04/11/2017   Type II diabetes mellitus (HCC)  Type  II   Vaginal irritation 03/14/2016   Weak pulse 07/21/2021   Wound dehiscence 09/04/2019   Past Surgical History:  Procedure Laterality Date   A/V FISTULAGRAM Left 05/12/2021   Procedure: A/V FISTULAGRAM;  Surgeon: Victorino Sparrow, MD;  Location: [facility] INVASIVE CV LAB;  Service: Cardiovascular;  Laterality: Left;   ABDOMINAL AORTOGRAM W/LOWER EXTREMITY Left 03/19/2019   Procedure: ABDOMINAL AORTOGRAM W/LOWER EXTREMITY;  Surgeon: Nada Libman, MD;  Location: [facility] INVASIVE CV LAB;  Service: Cardiovascular;  Laterality: Left;   ABOVE KNEE LEG AMPUTATION Left 10/11/2019   wound dehisence    AIR/FLUID EXCHANGE Right 12/31/2019   Procedure: AIR/FLUID EXCHANGE;  Surgeon: Carmela Rima, MD;  Location: [facility] OR;  Service: Ophthalmology;  Laterality: Right;   AMPUTATION Left 04/07/2017   Procedure: LEFT BELOW KNEE AMPUTATION;  Surgeon: Nadara Mustard, MD;  Location: [facility] OR;  Service: Orthopedics;  Laterality: Left;   AMPUTATION Left 10/11/2019   Procedure: LEFT ABOVE KNEE AMPUTATION;  Surgeon: Nadara Mustard, MD;  Location: [facility] OR;  Service: Orthopedics;  Laterality: Left;   AV FISTULA PLACEMENT Left 01/05/2021   Procedure: ARTERIOVENOUS (AV) FISTULA CREATION vs. GRAFT;  Surgeon: Chuck Hint, MD;  Location: [facility] OR;  Service: Vascular;  Laterality: Left;   CATARACT EXTRACTION W/PHACO Right 02/11/2020    Procedure: CATARACT EXTRACTION PHACO AND INTRAOCULAR LENS PLACEMENT (IOC);  Surgeon: Carmela Rima, MD;  Location: [facility] OR;  Service: Ophthalmology;  Laterality: Right;   EYE SURGERY     lazer   FISTULA SUPERFICIALIZATION Left 02/23/2021   Procedure: SUPERFICIALIZATION OF LEFT BRACHIOCEPHALIC FISTULA;  Surgeon: Chuck Hint, MD;  Location: [facility] OR;  Service: Vascular;  Laterality: Left;   GAS INSERTION Right 12/31/2019   Procedure: INSERTION OF GAS;  Surgeon: Carmela Rima, MD;  Location: [facility] OR;  Service: Ophthalmology;  Laterality: Right;   INCISION AND DRAINAGE ABSCESS     multiple I&Ds   INCISION AND DRAINAGE ABSCESS Left 07/09/2012   Procedure: DRESSING CHANGE, THIGH WOUND;  Surgeon: Shelly Rubenstein, MD;  Location: [facility] OR;  Service: General;  Laterality: Left;   INCISION AND DRAINAGE OF WOUND Left 07/07/2012   Procedure: IRRIGATION AND DEBRIDEMENT WOUND;  Surgeon: Shelly Rubenstein, MD;  Location: [facility] OR;  Service: General;  Laterality: Left;   INCISION AND DRAINAGE PERIRECTAL ABSCESS Left 07/14/2012   Procedure: DEBRIDEMENT OF SKIN & SOFT TISSUE; DRESSING CHANGE UNDER ANESTHESIA;  Surgeon: Atilano Ina, MD,FACS;  Location: [facility] OR;  Service: General;  Laterality: Left;   INCISION AND DRAINAGE PERIRECTAL ABSCESS Left 07/16/2012   Procedure: I&D Left Thigh;  Surgeon: Cherylynn Ridges, MD;  Location: [facility] OR;  Service: General;  Laterality: Left;   INCISION AND DRAINAGE PERIRECTAL ABSCESS N/A 01/05/2015   Procedure: IRRIGATION AND DEBRIDEMENT PERIRECTAL ABSCESS;  Surgeon: Manus Rudd, MD;  Location: [facility] OR;  Service: General;  Laterality: N/A;   INSERTION OF DIALYSIS CATHETER Right 01/05/2021   Procedure: INSERTION OF DIALYSIS CATHETER;  Surgeon: Chuck Hint, MD;  Location: [facility] OR;  Service: Vascular;  Laterality: Right;   IRRIGATION AND DEBRIDEMENT ABSCESS Left 07/06/2012   Procedure: IRRIGATION AND DEBRIDEMENT ABSCESS BUTTOCKS AND THIGH;  Surgeon: Kandis Cocking, MD;   Location: [facility] OR;  Service: General;  Laterality: Left;   IRRIGATION AND DEBRIDEMENT ABSCESS Left 08/10/2012   Procedure: IRRIGATION AND DEBRIDEMENT ABSCESS;  Surgeon: Lodema Pilot, DO;  Location: [facility] OR;  Service: General;  Laterality: Left;   LEFT HEART CATH AND CORONARY ANGIOGRAPHY N/A 10/31/2022  Procedure: LEFT HEART CATH AND CORONARY ANGIOGRAPHY;  Surgeon: Lennette Bihari, MD;  Location: [facility] INVASIVE CV LAB;  Service: Cardiovascular;  Laterality: N/A;   MEMBRANE PEEL Left 07/24/2022   Procedure: MEMBRANE PEEL;  Surgeon: Carmela Rima, MD;  Location: [facility] OR;  Service: Ophthalmology;  Laterality: Left;   PARS PLANA VITRECTOMY Right 12/31/2019   Procedure: PARS PLANA VITRECTOMY WITH 25 GAUGE;  Surgeon: Carmela Rima, MD;  Location: [facility] OR;  Service: Ophthalmology;  Laterality: Right;   PARS PLANA VITRECTOMY Left 07/24/2022   Procedure: PARS PLANA VITRECTOMY WITH 25 GAUGE;  Surgeon: Carmela Rima, MD;  Location: [facility] OR;  Service: Ophthalmology;  Laterality: Left;   PERIPHERAL VASCULAR INTERVENTION Left 05/12/2021   Procedure: PERIPHERAL VASCULAR INTERVENTION;  Surgeon: Victorino Sparrow, MD;  Location: [facility] INVASIVE CV LAB;  Service: Cardiovascular;  Laterality: Left;  arm fistula   PHOTOCOAGULATION Left 07/24/2022   Procedure: AIRGAS, SILICONE OIL, PHOTOCOAGULATION;  Surgeon: Carmela Rima, MD;  Location: [facility] OR;  Service: Ophthalmology;  Laterality: Left;   PHOTOCOAGULATION WITH LASER Right 12/31/2019   Procedure: PHOTOCOAGULATION WITH LASER;  Surgeon: Carmela Rima, MD;  Location: [facility] OR;  Service: Ophthalmology;  Laterality: Right;   REPAIR OF COMPLEX TRACTION RETINAL DETACHMENT Right 12/31/2019   Procedure: REPAIR OF COMPLEX TRACTION RETINAL;  Surgeon: Carmela Rima, MD;  Location: [facility] OR;  Service: Ophthalmology;  Laterality: Right;   REPAIR OF COMPLEX TRACTION RETINAL DETACHMENT Left 07/24/2022   Procedure: REPAIR OF COMPLEX TRACTION RETINAL DETACHMENT;  Surgeon: Carmela Rima, MD;  Location:  [facility] OR;  Service: Ophthalmology;  Laterality: Left;   STUMP REVISION Left 06/09/2017   Procedure: REVISION LEFT BELOW KNEE AMPUTATION;  Surgeon: Nadara Mustard, MD;  Location: [facility] OR;  Service: Orthopedics;  Laterality: Left;   STUMP REVISION Left 09/04/2019   Procedure: LEFT BELOW KNEE AMPUTATION REVISION;  Surgeon: Nadara Mustard, MD;  Location: [facility] OR;  Service: Orthopedics;  Laterality: Left;   STUMP REVISION Left 09/20/2019   Procedure: LEFT BELOW KNEE AMPUTATION REVISION;  Surgeon: Nadara Mustard, MD;  Location: [facility] OR;  Service: Orthopedics;  Laterality: Left;   TONSILLECTOMY  1994   Family History  Problem Relation Age of Onset   Heart failure Mother    Diabetes Mother    Kidney disease Mother    Kidney disease Father    Diabetes Father    Diabetes Paternal Grandmother    Heart failure Paternal Grandmother    Other Other    Social History   Socioeconomic History   Marital status: Single    Spouse name: Not on file   Number of children: 0   Years of education: 11   Highest education level: Not on file  Occupational History   Occupation: disability  Tobacco Use   Smoking status: Former    Current packs/day: 0.00    Average packs/day: 0.5 packs/day for 16.0 years (8.0 ttl pk-yrs)    Types: Cigarettes, E-cigarettes    Start date: 08/2005    Quit date: 08/2021    Years since quitting: 1.3   Smokeless tobacco: Never  Vaping Use   Vaping status: Some Days   Substances: Nicotine, THC, CBD, Flavoring   Devices: CBD oil  Substance and Sexual Activity   Alcohol use: Yes    Alcohol/week: 0.0 standard drinks of alcohol    Comment: social   Drug use: Not Currently    Types: Marijuana   Sexual activity: Yes    Birth control/protection: Condom  Other Topics Concern   Not  on file  Social History Narrative   Lives with mother currently, goddaughter and uncle in a one story home.     On disability for diabetes, neuropathy, sleep apnea etc.  Disability since 2014.     Education: 11th grade.    Social Determinants of Health   Financial Resource Strain: High Risk (12/28/2022)   Overall Financial Resource Strain (CARDIA)    Difficulty of Paying Living Expenses: Hard  Food Insecurity: Food Insecurity Present (12/28/2022)   Hunger Vital Sign    Worried About Running Out of Food in the Last Year: Often true    Ran Out of Food in the Last Year: Often true  Transportation Needs: Unmet Transportation Needs (12/28/2022)   PRAPARE - Administrator, Civil Service (Medical): Yes    Lack of Transportation (Non-Medical): Yes  Physical Activity: Inactive (12/28/2022)   Exercise Vital Sign    Days of Exercise per Week: 0 days    Minutes of Exercise per Session: 0 min  Stress: No Stress Concern Present (12/28/2022)   [employer] of Occupational Health - Occupational Stress Questionnaire    Feeling of Stress : Only a little  Social Connections: Moderately Isolated (12/28/2022)   Social Connection and Isolation Panel [NHANES]    Frequency of Communication with Friends and Family: More than three times a week    Frequency of Social Gatherings with Friends and Family: Once a week    Attends Religious Services: 1 to 4 times per year    Active Member of [employer] or Organizations: No    Attends Banker Meetings: Never    Marital Status: Widowed    Tobacco Counseling Counseling given: Not Answered   Clinical Intake:  Pre-visit preparation completed: Yes  Pain : 0-10 Pain Score: 3  Pain Type: Chronic pain (patient stated-fibromyalgia) Pain Location: Knee Pain Onset: More than a month ago Pain Frequency: Constant Pain Relieving Factors: oxycodone  Pain Relieving Factors: oxycodone  BMI - recorded: 35.95 Nutritional Risks: None Diabetes: Yes CBG done?: Yes CBG resulted in Enter/ Edit results?: No Did pt. bring in CBG monitor from home?: No  How often do you need to have someone help you when you read instructions, pamphlets, or other  written materials from your doctor or pharmacy?: 1 - Never  Interpreter Needed?: No  Information entered by ::  , RMA   Activities of Daily Living    12/28/2022    2:39 PM 11/14/2022    9:43 AM  In your present state of health, do you have any difficulty performing the following activities:  Hearing? 0 0  Vision? 0 0  Difficulty concentrating or making decisions? 0 0  Walking or climbing stairs? 1 1  Dressing or bathing? 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 0 0  Comment Has medicare/Big wheel transportation   Preparing Food and eating ? N   Using the Toilet? N   In the past six months, have you accidently leaked urine? N   Do you have problems with loss of bowel control? N   Managing your Medications? N   Managing your Finances? N   Housekeeping or managing your Housekeeping? N     Patient Care Team: Morrie Sheldon, MD as PCP - General [place], Peter M, MD as PCP - Cardiology (Cardiology) Center, [facility]  Indicate any recent Medical Services you may have received from other than [facility] providers in the past year (date may be  approximate).     Assessment:   This is a routine wellness examination for [address].  Hearing/Vision screen Hearing Screening - Comments:: Denies hearing difficulties    Dietary issues and exercise activities discussed:     Goals Addressed   None   Depression Screen    12/28/2022    2:53 PM 11/14/2022    9:43 AM 09/02/2022    9:32 AM 12/10/2021   11:37 AM 12/06/2021   12:19 PM 11/08/2021    1:22 PM 09/15/2021    4:39 PM  PHQ 2/9 Scores  PHQ - 2 Score 2  2 2 2 2  0  PHQ- 9 Score 4  6 6 6 2    Exception Documentation  Patient refusal         Fall Risk    12/28/2022    2:48 PM 11/14/2022    9:43 AM 09/02/2022    9:33 AM 07/20/2022    3:38 PM 12/10/2021   11:36 AM  Fall Risk   Falls in the past year? 1 1 0 0 1  Number falls in past yr:  0 0 0 1  Injury with Fall?  0 0 0 0  Risk  for fall due to :   Impaired balance/gait;Impaired mobility  Impaired balance/gait  Follow up  Falls evaluation completed;Falls prevention discussed Falls evaluation completed Falls evaluation completed Falls evaluation completed    MEDICARE RISK AT HOME:  Medicare Risk at Home - 12/28/22 1448     Any stairs in or around the home? Yes   outside   If so, are there any without handrails? Yes    Home free of loose throw rugs in walkways, pet beds, electrical cords, etc? Yes    Adequate lighting in your home to reduce risk of falls? Yes    Life alert? No    Use of a cane, walker or w/c? Yes   mobility aide crutches   Grab bars in the bathroom? No    Shower chair or bench in shower? Yes    Elevated toilet seat or a handicapped toilet? No             TIMED UP AND GO:  Was the test performed?  No    Cognitive Function:        12/28/2022    2:49 PM 12/06/2021   11:14 AM  6CIT Screen  What Year? 0 points 0 points  What month? 0 points 0 points  What time? 0 points 0 points  Count back from 20 0 points 0 points  Months in reverse 0 points 0 points  Repeat phrase 0 points 0 points  Total Score 0 points 0 points    Immunizations Immunization History  Administered Date(s) Administered   Influenza Split 05/05/2011   Influenza,inj,Quad PF,6+ Mos 04/25/2013, 02/03/2014, 08/13/2015, 02/08/2016, 02/09/2017, 06/28/2018, 02/28/2019   Pneumococcal Polysaccharide-23 01/02/2012, 07/07/2012   Tdap 09/07/2017    TDAP status: Up to date  Flu Vaccine status: Due, Education has been provided regarding the importance of this vaccine. Advised may receive this vaccine at local pharmacy or Health Dept. Aware to provide a copy of the vaccination record if obtained from local pharmacy or Health Dept. Verbalized acceptance and understanding.  Pneumococcal vaccine status: Up to date  Covid-19 vaccine status: Declined, Education has been provided regarding the importance of this vaccine but  patient still declined. Advised may receive this vaccine at local pharmacy or Health Dept.or vaccine clinic. Aware to provide a copy of the vaccination record if  obtained from local pharmacy or Health Dept. Verbalized acceptance and understanding.  Qualifies for Shingles Vaccine? No   Zostavax completed No   Shingrix Completed?: No.    Education has been provided regarding the importance of this vaccine. Patient has been advised to call insurance company to determine out of pocket expense if they have not yet received this vaccine. Advised may also receive vaccine at local pharmacy or Health Dept. Verbalized acceptance and understanding.  Screening Tests Health Maintenance  Topic Date Due   Colonoscopy  Never done   COVID-19 Vaccine (1 - 2023-24 season) Never done   OPHTHALMOLOGY EXAM  03/11/2022   FOOT EXAM  07/22/2022   COLON CANCER SCREENING ANNUAL FOBT  09/16/2022   INFLUENZA VACCINE  12/22/2022   HEMOGLOBIN A1C  02/14/2023   LIPID PANEL  10/31/2023   Medicare Annual Wellness (AWV)  12/28/2023   PAP SMEAR-Modifier  09/10/2025   DTaP/Tdap/Td (2 - Td or Tdap) 09/08/2027   Hepatitis C Screening  Completed   HIV Screening  Completed   HPV VACCINES  Aged Out    Health Maintenance  Health Maintenance Due  Topic Date Due   Colonoscopy  Never done   COVID-19 Vaccine (1 - 2023-24 season) Never done   OPHTHALMOLOGY EXAM  03/11/2022   FOOT EXAM  07/22/2022   COLON CANCER SCREENING ANNUAL FOBT  09/16/2022   INFLUENZA VACCINE  12/22/2022    Colorectal cancer screening: Referral to GI placed sent though provider. Pt aware the office will call re: appt.  Mammogram status: Completed 12/19/2022. Repeat every year   Lung Cancer Screening: (Low Dose CT Chest recommended if Age 54-80 years, 20 pack-year currently smoking OR have quit w/in 15years.) does not qualify.   Lung Cancer Screening Referral: N/A  Additional Screening:  Hepatitis C Screening: does qualify; Completed  09/06/2012  Vision Screening: Recommended annual ophthalmology exams for early detection of glaucoma and other disorders of the eye. Is the patient up to date with their annual eye exam?  Yes  Who is the provider or what is the name of the office in which the patient attends annual eye exams? Dr. Allena Katz If pt is not established with a provider, would they like to be referred to a provider to establish care? No .   Dental Screening: Recommended annual dental exams for proper oral hygiene  Diabetic Foot Exam: Diabetic Foot Exam: Overdue, Pt has been advised about the importance in completing this exam. Pt is scheduled for diabetic foot exam on Patient declines.  Community Resource Referral / Chronic Care Management: CRR required this visit?  Yes   CCM required this visit?  No     Plan:     I have personally reviewed and noted the following in the patient's chart:   Medical and social history Use of alcohol, tobacco or illicit drugs  Current medications and supplements including opioid prescriptions. Patient is currently taking opioid prescriptions. Information provided to patient regarding non-opioid alternatives. Patient advised to discuss non-opioid treatment plan with their provider. Functional ability and status Nutritional status Physical activity Advanced directives List of other physicians Hospitalizations, surgeries, and ER visits in previous 12 months Vitals Screenings to include cognitive, depression, and falls Referrals and appointments  In addition, I have reviewed and discussed with patient certain preventive protocols, quality metrics, and best practice recommendations. A written personalized care plan for preventive services as well as general preventive health recommendations were provided to patient.      L , CMA  12/28/2022   After Visit Summary: (MyChart) Due to this being a telephonic visit, the after visit summary with patients personalized plan  was offered to patient via MyChart   Nurse Notes: Patient is due for a colonoscopy and would like a referral sent to [facility].  Patient also stated that she is in need of a [employer] Referral for food and some help with utilities.  Please advise.

## 2022-12-28 NOTE — Patient Instructions (Addendum)
[name] [name] , Thank you for taking time to come for your Medicare Wellness Visit. I appreciate your ongoing commitment to your health goals. Please review the following plan we discussed and let me know if I can assist you in the future.   Referrals/Orders/Follow-Ups/Clinician Recommendations: You are due to get your Flu vaccine soon.  I have informed your PCP that you are due for a colonoscopy and recommended you get it done at [facility],  [address], [address] [place], [phone].  You can call to schedule your appointment after the referral is placed by your PCP.  I also informed your provider that you are in need of a Community Resource Referral .  This is a list of the screening recommended for you and due dates:  Health Maintenance  Topic Date Due   Colon Cancer Screening  Never done   COVID-19 Vaccine (1 - 2023-24 season) Never done   Eye exam for diabetics  03/11/2022   Complete foot exam   07/22/2022   Stool Blood Test  09/16/2022   Flu Shot  12/22/2022   Hemoglobin A1C  02/14/2023   Lipid (cholesterol) test  10/31/2023   Medicare Annual Wellness Visit  12/28/2023   Pap Smear  09/10/2025   DTaP/Tdap/Td vaccine (2 - Td or Tdap) 09/08/2027   Hepatitis C Screening  Completed   HIV Screening  Completed   HPV Vaccine  Aged Out    Advanced directives: (Declined) Advance directive discussed with you today. Even though you declined this today, please call our office should you change your mind, and we can give you the proper paperwork for you to fill out.  Next Medicare Annual Wellness Visit scheduled for next year: No  Preventive Care 40-64 Years, Female Preventive care refers to lifestyle choices and visits with your health care provider that can promote health and wellness. What does preventive care include? A yearly physical exam. This is also called an annual well check. Dental exams once or twice a year. Routine eye exams. Ask your health care  provider how often you should have your eyes checked. Personal lifestyle choices, including: Daily care of your teeth and gums. Regular physical activity. Eating a healthy diet. Avoiding tobacco and drug use. Limiting alcohol use. Practicing safe sex. Taking low-dose aspirin daily starting at age 39. Taking vitamin and mineral supplements as recommended by your health care provider. What happens during an annual well check? The services and screenings done by your health care provider during your annual well check will depend on your age, overall health, lifestyle risk factors, and family history of disease. Counseling  Your health care provider may ask you questions about your: Alcohol use. Tobacco use. Drug use. Emotional well-being. Home and relationship well-being. Sexual activity. Eating habits. Work and work Astron[employer]. Method of birth control. Menstrual cycle. Pregnancy history. Screening  You may have the following tests or measurements: Height, weight, and BMI. Blood pressure. Lipid and cholesterol levels. These may be checked every 5 years, or more frequently if you are over 54 years old. Skin check. Lung cancer screening. You may have this screening every year starting at age 72 if you have a 30-pack-year history of smoking and currently smoke or have quit within the past 15 years. Fecal occult blood test (FOBT) of the stool. You may have this test every year starting at age 34. Flexible sigmoidoscopy or colonoscopy. You may have a sigmoidoscopy every 5 years or a colonoscopy every 10 years  starting at age 45. Hepatitis C blood test. Hepatitis B blood test. Sexually transmitted disease (STD) testing. Diabetes screening. This is done by checking your blood sugar (glucose) after you have not eaten for a while (fasting). You may have this done every 1-3 years. Mammogram. This may be done every 1-2 years. Talk to your health care provider about when you should start  having regular mammograms. This may depend on whether you have a family history of breast cancer. BRCA-related cancer screening. This may be done if you have a family history of breast, ovarian, tubal, or peritoneal cancers. Pelvic exam and Pap test. This may be done every 3 years starting at age 73. Starting at age 42, this may be done every 5 years if you have a Pap test in combination with an HPV test. Bone density scan. This is done to screen for osteoporosis. You may have this scan if you are at high risk for osteoporosis. Discuss your test results, treatment options, and if necessary, the need for more tests with your health care provider. Vaccines  Your health care provider may recommend certain vaccines, such as: Influenza vaccine. This is recommended every year. Tetanus, diphtheria, and acellular pertussis (Tdap, Td) vaccine. You may need a Td booster every 10 years. Zoster vaccine. You may need this after age 67. Pneumococcal 13-valent conjugate (PCV13) vaccine. You may need this if you have certain conditions and were not previously vaccinated. Pneumococcal polysaccharide (PPSV23) vaccine. You may need one or two doses if you smoke cigarettes or if you have certain conditions. Talk to your health care provider about which screenings and vaccines you need and how often you need them. This information is not intended to replace advice given to you by your health care provider. Make sure you discuss any questions you have with your health care provider. Document Released: 06/05/2015 Document Revised: 01/27/2016 Document Reviewed: 03/10/2015 Elsevier Interactive Patient Education  2017 [employer].    Fall Prevention in the Home Falls can cause injuries. They can happen to people of all ages. There are many things you can do to make your home safe and to help prevent falls. What can I do on the outside of my home? Regularly fix the edges of walkways and driveways and fix any  cracks. Remove anything that might make you trip as you walk through a door, such as a raised step or threshold. Trim any bushes or trees on the path to your home. Use bright outdoor lighting. Clear any walking paths of anything that might make someone trip, such as rocks or tools. Regularly check to see if handrails are loose or broken. Make sure that both sides of any steps have handrails. Any raised decks and porches should have guardrails on the edges. Have any leaves, snow, or ice cleared regularly. Use sand or salt on walking paths during winter. Clean up any spills in your garage right away. This includes oil or grease spills. What can I do in the bathroom? Use night lights. Install grab bars by the toilet and in the tub and shower. Do not use towel bars as grab bars. Use non-skid mats or decals in the tub or shower. If you need to sit down in the shower, use a plastic, non-slip stool. Keep the floor dry. Clean up any water that spills on the floor as soon as it happens. Remove soap buildup in the tub or shower regularly. Attach bath mats securely with double-sided non-slip rug tape. Do not have throw  rugs and other things on the floor that can make you trip. What can I do in the bedroom? Use night lights. Make sure that you have a light by your bed that is easy to reach. Do not use any sheets or blankets that are too big for your bed. They should not hang down onto the floor. Have a firm chair that has side arms. You can use this for support while you get dressed. Do not have throw rugs and other things on the floor that can make you trip. What can I do in the kitchen? Clean up any spills right away. Avoid walking on wet floors. Keep items that you use a lot in easy-to-reach places. If you need to reach something above you, use a strong step stool that has a grab bar. Keep electrical cords out of the way. Do not use floor polish or wax that makes floors slippery. If you must  use wax, use non-skid floor wax. Do not have throw rugs and other things on the floor that can make you trip. What can I do with my stairs? Do not leave any items on the stairs. Make sure that there are handrails on both sides of the stairs and use them. Fix handrails that are broken or loose. Make sure that handrails are as long as the stairways. Check any carpeting to make sure that it is firmly attached to the stairs. Fix any carpet that is loose or worn. Avoid having throw rugs at the top or bottom of the stairs. If you do have throw rugs, attach them to the floor with carpet tape. Make sure that you have a light switch at the top of the stairs and the bottom of the stairs. If you do not have them, ask someone to add them for you. What else can I do to help prevent falls? Wear shoes that: Do not have high heels. Have rubber bottoms. Are comfortable and fit you well. Are closed at the toe. Do not wear sandals. If you use a stepladder: Make sure that it is fully opened. Do not climb a closed stepladder. Make sure that both sides of the stepladder are locked into place. Ask someone to hold it for you, if possible.Managing Pain Without Opioids Opioids are strong medicines used to treat moderate to severe pain. For some people, especially those who have long-term (chronic) pain, opioids may not be the best choice for pain management due to: Side effects like nausea, constipation, and sleepiness. The risk of addiction (opioid use disorder). The longer you take opioids, the greater your risk of addiction. Pain that lasts for more than 3 months is called chronic pain. Managing chronic pain usually requires more than one approach and is often provided by a team of health care providers working together (multidisciplinary approach). Pain management may be done at a pain management center or pain clinic. How to manage pain without the use of opioids Use non-opioid medicines Non-opioid medicines for  pain may include: Over-the-counter or prescription non-steroidal anti-inflammatory drugs (NSAIDs). These may be the first medicines used for pain. They work well for muscle and bone pain, and they reduce swelling. Acetaminophen. This over-the-counter medicine may work well for milder pain but not swelling. Antidepressants. These may be used to treat chronic pain. A certain type of antidepressant (tricyclics) is often used. These medicines are given in lower doses for pain than when used for depression. Anticonvulsants. These are usually used to treat seizures but may also reduce nerve (  neuropathic) pain. Muscle relaxants. These relieve pain caused by sudden muscle tightening (spasms). You may also use a pain medicine that is applied to the skin as a patch, cream, or gel (topical analgesic), such as a numbing medicine. These may cause fewer side effects than medicines taken by mouth. Do certain therapies as directed Some therapies can help with pain management. They include: Physical therapy. You will do exercises to gain strength and flexibility. A physical therapist may teach you exercises to move and stretch parts of your body that are weak, stiff, or painful. You can learn these exercises at physical therapy visits and practice them at home. Physical therapy may also involve: Massage. Heat wraps or applying heat or cold to affected areas. Electrical signals that interrupt pain signals (transcutaneous electrical nerve stimulation, TENS). Weak lasers that reduce pain and swelling (low-level laser therapy). Signals from your body that help you learn to regulate pain (biofeedback). Occupational therapy. This helps you to learn ways to function at home and work with less pain. Recreational therapy. This involves trying new activities or hobbies, such as a physical activity or drawing. Mental health therapy, including: Cognitive behavioral therapy (CBT). This helps you learn coping skills for dealing  with pain. Acceptance and commitment therapy (ACT) to change the way you think and react to pain. Relaxation therapies, including muscle relaxation exercises and mindfulness-based stress reduction. Pain management counseling. This may be individual, family, or group counseling.  Receive medical treatments Medical treatments for pain management include: Nerve block injections. These may include a pain blocker and anti-inflammatory medicines. You may have injections: Near the spine to relieve chronic back or neck pain. Into joints to relieve back or joint pain. Into nerve areas that supply a painful area to relieve body pain. Into muscles (trigger point injections) to relieve some painful muscle conditions. A medical device placed near your spine to help block pain signals and relieve nerve pain or chronic back pain (spinal cord stimulation device). Acupuncture. Follow these instructions at home Medicines Take over-the-counter and prescription medicines only as told by your health care provider. If you are taking pain medicine, ask your health care providers about possible side effects to watch out for. Do not drive or use heavy machinery while taking prescription opioid pain medicine. Lifestyle  Do not use drugs or alcohol to reduce pain. If you drink alcohol, limit how much you have to: 0-1 drink a day for women who are not pregnant. 0-2 drinks a day for men. Know how much alcohol is in a drink. In the [place] one drink equals one 12 oz bottle of beer (355 mL), one 5 oz glass of wine (148 mL), or one 1 oz glass of hard liquor (44 mL). Do not use any products that contain nicotine or tobacco. These products include cigarettes, chewing tobacco, and vaping devices, such as e-cigarettes. If you need help quitting, ask your health care provider. Eat a healthy diet and maintain a healthy weight. Poor diet and excess weight may make pain worse. Eat foods that are high in fiber. These include fresh  fruits and vegetables, whole grains, and beans. Limit foods that are high in fat and processed sugars, such as fried and sweet foods. Exercise regularly. Exercise lowers stress and may help relieve pain. Ask your health care provider what activities and exercises are safe for you. If your health care provider approves, join an exercise class that combines movement and stress reduction. Examples include yoga and tai chi. Get enough sleep. Lack  of sleep may make pain worse. Lower stress as much as possible. Practice stress reduction techniques as told by your therapist. General instructions Work with all your pain management providers to find the treatments that work best for you. You are an important member of your pain management team. There are many things you can do to reduce pain on your own. Consider joining an online or in-person support group for people who have chronic pain. Keep all follow-up visits. This is important. Where to find more information You can find more information about managing pain without opioids from: American Academy of Pain Medicine: painmed.org Institute for Chronic Pain: instituteforchronicpain.org American Chronic Pain Association: theacpa.org Contact a health care provider if: You have side effects from pain medicine. Your pain gets worse or does not get better with treatments or home therapy. You are struggling with anxiety or depression. Summary Many types of pain can be managed without opioids. Chronic pain may respond better to pain management without opioids. Pain is best managed when you and a team of health care providers work together. Pain management without opioids may include non-opioid medicines, medical treatments, physical therapy, mental health therapy, and lifestyle changes. Tell your health care providers if your pain gets worse or is not being managed well enough. This information is not intended to replace advice given to you by your health  care provider. Make sure you discuss any questions you have with your health care provider. Document Revised: 08/19/2020 Document Reviewed: 08/19/2020 Elsevier Patient Education  2024 [employer]  Clearly mark and make sure that you can see: Any grab bars or handrails. First and last steps. Where the edge of each step is. Use tools that help you move around (mobility aids) if they are needed. These include: Canes. Walkers. Scooters. Crutches. Turn on the lights when you go into a dark area. Replace any light bulbs as soon as they burn out. Set up your furniture so you have a clear path. Avoid moving your furniture around. If any of your floors are uneven, fix them. If there are any pets around you, be aware of where they are. Review your medicines with your doctor. Some medicines can make you feel dizzy. This can increase your chance of falling. Ask your doctor what other things that you can do to help prevent falls. This information is not intended to replace advice given to you by your health care provider. Make sure you discuss any questions you have with your health care provider. Document Released: 03/05/2009 Document Revised: 10/15/2015 Document Reviewed: 06/13/2014 Elsevier Interactive Patient Education  2017 [employer].

## 2022-12-29 DIAGNOSIS — L299 Pruritus, unspecified: Secondary | ICD-10-CM | POA: Diagnosis not present

## 2022-12-29 DIAGNOSIS — N2581 Secondary hyperparathyroidism of renal origin: Secondary | ICD-10-CM | POA: Diagnosis not present

## 2022-12-29 DIAGNOSIS — Z992 Dependence on renal dialysis: Secondary | ICD-10-CM | POA: Diagnosis not present

## 2022-12-29 DIAGNOSIS — D689 Coagulation defect, unspecified: Secondary | ICD-10-CM | POA: Diagnosis not present

## 2022-12-29 DIAGNOSIS — N186 End stage renal disease: Secondary | ICD-10-CM | POA: Diagnosis not present

## 2022-12-29 DIAGNOSIS — D631 Anemia in chronic kidney disease: Secondary | ICD-10-CM | POA: Diagnosis not present

## 2022-12-29 DIAGNOSIS — D509 Iron deficiency anemia, unspecified: Secondary | ICD-10-CM | POA: Diagnosis not present

## 2022-12-31 DIAGNOSIS — N186 End stage renal disease: Secondary | ICD-10-CM | POA: Diagnosis not present

## 2022-12-31 DIAGNOSIS — Z992 Dependence on renal dialysis: Secondary | ICD-10-CM | POA: Diagnosis not present

## 2022-12-31 DIAGNOSIS — L299 Pruritus, unspecified: Secondary | ICD-10-CM | POA: Diagnosis not present

## 2022-12-31 DIAGNOSIS — D689 Coagulation defect, unspecified: Secondary | ICD-10-CM | POA: Diagnosis not present

## 2022-12-31 DIAGNOSIS — D631 Anemia in chronic kidney disease: Secondary | ICD-10-CM | POA: Diagnosis not present

## 2022-12-31 DIAGNOSIS — D509 Iron deficiency anemia, unspecified: Secondary | ICD-10-CM | POA: Diagnosis not present

## 2022-12-31 DIAGNOSIS — N2581 Secondary hyperparathyroidism of renal origin: Secondary | ICD-10-CM | POA: Diagnosis not present

## 2023-01-02 DIAGNOSIS — M25519 Pain in unspecified shoulder: Secondary | ICD-10-CM | POA: Diagnosis not present

## 2023-01-02 DIAGNOSIS — G890 Central pain syndrome: Secondary | ICD-10-CM | POA: Diagnosis not present

## 2023-01-02 DIAGNOSIS — Z79891 Long term (current) use of opiate analgesic: Secondary | ICD-10-CM | POA: Diagnosis not present

## 2023-01-02 DIAGNOSIS — G546 Phantom limb syndrome with pain: Secondary | ICD-10-CM | POA: Diagnosis not present

## 2023-01-02 DIAGNOSIS — I159 Secondary hypertension, unspecified: Secondary | ICD-10-CM | POA: Diagnosis not present

## 2023-01-02 DIAGNOSIS — M25562 Pain in left knee: Secondary | ICD-10-CM | POA: Diagnosis not present

## 2023-01-02 DIAGNOSIS — G894 Chronic pain syndrome: Secondary | ICD-10-CM | POA: Diagnosis not present

## 2023-01-02 DIAGNOSIS — M79643 Pain in unspecified hand: Secondary | ICD-10-CM | POA: Diagnosis not present

## 2023-01-02 DIAGNOSIS — M545 Low back pain, unspecified: Secondary | ICD-10-CM | POA: Diagnosis not present

## 2023-01-02 DIAGNOSIS — N186 End stage renal disease: Secondary | ICD-10-CM | POA: Diagnosis not present

## 2023-01-02 DIAGNOSIS — G4733 Obstructive sleep apnea (adult) (pediatric): Secondary | ICD-10-CM | POA: Diagnosis not present

## 2023-01-02 DIAGNOSIS — Z992 Dependence on renal dialysis: Secondary | ICD-10-CM | POA: Diagnosis not present

## 2023-01-03 ENCOUNTER — Other Ambulatory Visit: Payer: Self-pay

## 2023-01-03 DIAGNOSIS — N186 End stage renal disease: Secondary | ICD-10-CM | POA: Diagnosis not present

## 2023-01-03 DIAGNOSIS — D689 Coagulation defect, unspecified: Secondary | ICD-10-CM | POA: Diagnosis not present

## 2023-01-03 DIAGNOSIS — Z992 Dependence on renal dialysis: Secondary | ICD-10-CM | POA: Diagnosis not present

## 2023-01-03 DIAGNOSIS — L299 Pruritus, unspecified: Secondary | ICD-10-CM | POA: Diagnosis not present

## 2023-01-03 DIAGNOSIS — D509 Iron deficiency anemia, unspecified: Secondary | ICD-10-CM | POA: Diagnosis not present

## 2023-01-03 DIAGNOSIS — L709 Acne, unspecified: Secondary | ICD-10-CM

## 2023-01-03 DIAGNOSIS — D631 Anemia in chronic kidney disease: Secondary | ICD-10-CM | POA: Diagnosis not present

## 2023-01-03 DIAGNOSIS — N2581 Secondary hyperparathyroidism of renal origin: Secondary | ICD-10-CM | POA: Diagnosis not present

## 2023-01-03 MED ORDER — TRETINOIN 0.025 % EX CREA: TOPICAL_CREAM | Freq: Every day | CUTANEOUS | 0 refills | Status: DC

## 2023-01-03 MED ORDER — BENZOYL PEROXIDE 2.5 % EX CREA
1.0000 [drp] | TOPICAL_CREAM | Freq: Every day | CUTANEOUS | 0 refills | Status: AC
Start: 2023-01-03 — End: ?

## 2023-01-04 ENCOUNTER — Ambulatory Visit: Payer: 59 | Admitting: Family

## 2023-01-05 DIAGNOSIS — N2581 Secondary hyperparathyroidism of renal origin: Secondary | ICD-10-CM | POA: Diagnosis not present

## 2023-01-05 DIAGNOSIS — D509 Iron deficiency anemia, unspecified: Secondary | ICD-10-CM | POA: Diagnosis not present

## 2023-01-05 DIAGNOSIS — Z992 Dependence on renal dialysis: Secondary | ICD-10-CM | POA: Diagnosis not present

## 2023-01-05 DIAGNOSIS — N186 End stage renal disease: Secondary | ICD-10-CM | POA: Diagnosis not present

## 2023-01-05 DIAGNOSIS — D689 Coagulation defect, unspecified: Secondary | ICD-10-CM | POA: Diagnosis not present

## 2023-01-05 DIAGNOSIS — D631 Anemia in chronic kidney disease: Secondary | ICD-10-CM | POA: Diagnosis not present

## 2023-01-05 DIAGNOSIS — L299 Pruritus, unspecified: Secondary | ICD-10-CM | POA: Diagnosis not present

## 2023-01-07 DIAGNOSIS — Z992 Dependence on renal dialysis: Secondary | ICD-10-CM | POA: Diagnosis not present

## 2023-01-07 DIAGNOSIS — N2581 Secondary hyperparathyroidism of renal origin: Secondary | ICD-10-CM | POA: Diagnosis not present

## 2023-01-07 DIAGNOSIS — N186 End stage renal disease: Secondary | ICD-10-CM | POA: Diagnosis not present

## 2023-01-07 DIAGNOSIS — D631 Anemia in chronic kidney disease: Secondary | ICD-10-CM | POA: Diagnosis not present

## 2023-01-07 DIAGNOSIS — D509 Iron deficiency anemia, unspecified: Secondary | ICD-10-CM | POA: Diagnosis not present

## 2023-01-07 DIAGNOSIS — L299 Pruritus, unspecified: Secondary | ICD-10-CM | POA: Diagnosis not present

## 2023-01-07 DIAGNOSIS — D689 Coagulation defect, unspecified: Secondary | ICD-10-CM | POA: Diagnosis not present

## 2023-01-10 DIAGNOSIS — D631 Anemia in chronic kidney disease: Secondary | ICD-10-CM | POA: Diagnosis not present

## 2023-01-10 DIAGNOSIS — N186 End stage renal disease: Secondary | ICD-10-CM | POA: Diagnosis not present

## 2023-01-10 DIAGNOSIS — N2581 Secondary hyperparathyroidism of renal origin: Secondary | ICD-10-CM | POA: Diagnosis not present

## 2023-01-10 DIAGNOSIS — D509 Iron deficiency anemia, unspecified: Secondary | ICD-10-CM | POA: Diagnosis not present

## 2023-01-10 DIAGNOSIS — Z992 Dependence on renal dialysis: Secondary | ICD-10-CM | POA: Diagnosis not present

## 2023-01-10 DIAGNOSIS — D689 Coagulation defect, unspecified: Secondary | ICD-10-CM | POA: Diagnosis not present

## 2023-01-10 DIAGNOSIS — L299 Pruritus, unspecified: Secondary | ICD-10-CM | POA: Diagnosis not present

## 2023-01-11 ENCOUNTER — Encounter: Payer: Self-pay | Admitting: Family

## 2023-01-11 ENCOUNTER — Ambulatory Visit (INDEPENDENT_AMBULATORY_CARE_PROVIDER_SITE_OTHER): Payer: 59 | Admitting: Family

## 2023-01-11 DIAGNOSIS — M1711 Unilateral primary osteoarthritis, right knee: Secondary | ICD-10-CM

## 2023-01-11 MED ORDER — LIDOCAINE HCL 1 % IJ SOLN
5.0000 mL | INTRAMUSCULAR | Status: AC | PRN
Start: 2023-01-11 — End: 2023-01-11
  Administered 2023-01-11: 5 mL

## 2023-01-11 MED ORDER — METHYLPREDNISOLONE ACETATE 40 MG/ML IJ SUSP
40.0000 mg | INTRAMUSCULAR | Status: AC | PRN
Start: 2023-01-11 — End: 2023-01-11
  Administered 2023-01-11: 40 mg via INTRA_ARTICULAR

## 2023-01-11 NOTE — Progress Notes (Signed)
Office Visit Note   Patient: [name] [name]           Date of Birth: 1975/11/14           MRN: [id number] Visit Date: 01/11/2023              Requested by: Morrie Sheldon, MD [address] [place],  [place] [address] PCP: Morrie Sheldon, MD  Chief Complaint  Patient presents with  . Right Knee - Pain      HPI: The patient is a 47 year old woman who presents today complaining of chronic knee pain medial joint line pain pain with weightbearing complaint pain with start up  She has previously had good relief with Depo-Medrol injections for her osteoarthritic pain on the right.  She requests repeat injection today.  Assessment & Plan: Visit Diagnoses: No diagnosis found.  Plan: Depo-Medrol injection right knee.  Patient tolerated well.  Follow-Up Instructions: No follow-ups on file.   Right Knee Exam   Tenderness  The patient is experiencing tenderness in the medial joint line.  Range of Motion  The patient has normal right knee ROM.  Tests  Varus: negative Valgus: negative  Other  Erythema: absent Swelling: none Effusion: no effusion present     Patient is alert, oriented, no adenopathy, well-dressed, normal affect, normal respiratory effort.   Imaging: No results found. No images are attached to the encounter.  Labs: Lab Results  Component Value Date   HGBA1C 6.6 (A) 11/14/2022   HGBA1C 8.0 (A) 07/20/2022   HGBA1C 9.6 (A) 11/08/2021   ESRSEDRATE 130 (H) 03/24/2017   ESRSEDRATE 53 (H) 09/14/2015   ESRSEDRATE 132 (H) 07/06/2012   CRP 13.6 (H) 02/14/2019   CRP 38.6 (H) 04/03/2018   CRP 5.4 (H) 09/14/2015   LABURIC 8.3 (H) 04/15/2019   REPTSTATUS 09/16/2021 FINAL 09/15/2021   GRAMSTAIN  09/04/2019    FEW WBC PRESENT, PREDOMINANTLY PMN MODERATE GRAM POSITIVE COCCI    CULT (A) 09/15/2021    10,000 COLONIES/mL GROUP B STREP(S.AGALACTIAE)ISOLATED TESTING AGAINST S. AGALACTIAE NOT ROUTINELY PERFORMED DUE TO PREDICTABILITY OF AMP/PEN/VAN  SUSCEPTIBILITY. Performed at [facility], [address], [place], [place] [address]    [facility] STAPHYLOCOCCUS AUREUS 09/04/2019     Lab Results  Component Value Date   ALBUMIN 3.3 (L) 11/02/2022   ALBUMIN 3.9 11/01/2022   ALBUMIN 3.9 11/01/2022    Lab Results  Component Value Date   MG 2.2 01/12/2021   MG 2.0 01/07/2021   MG 2.3 04/07/2018   Lab Results  Component Value Date   VD25OH 4.45 (L) 10/31/2022    No results found for: "PREALBUMIN"    Latest Ref Rng & Units 11/01/2022   11:49 AM 11/01/2022    3:58 AM 10/30/2022   12:04 PM  CBC EXTENDED  WBC 4.0 - 10.5 K/uL 7.9  8.3  6.4   RBC 3.87 - 5.11 MIL/uL 4.44  4.22  4.60   Hemoglobin 12.0 - 15.0 g/dL 82.9  56.2  13.0   HCT 36.0 - 46.0 % 38.6  36.8  40.9   Platelets 150 - 400 K/uL 219  213  166   NEUT# 1.7 - 7.7 K/uL 5.4   4.7   Lymph# 0.7 - 4.0 K/uL 1.6   1.2      There is no height or weight on file to calculate BMI.  Orders:  No orders of the defined types were placed in this encounter.  No orders of the defined types were placed in this  encounter.    Procedures: Large Joint Inj: R knee on 01/11/2023 9:10 AM Indications: pain Details: 18 G 1.5 in needle, anteromedial approach Medications: 5 mL lidocaine 1 %; 40 mg methylPREDNISolone acetate 40 MG/ML Consent was given by the patient.     Clinical Data: No additional findings.  ROS:  All other systems negative, except as noted in the HPI. Review of Systems  Objective: Vital Signs: There were no vitals taken for this visit.  Specialty Comments:  No specialty comments available.  PMFS History: Patient Active Problem List   Diagnosis Date Noted  . CAD (coronary artery disease) 10/31/2022  . Trichomonas infection 09/06/2022  . Acne 07/21/2022  . Hidradenitis suppurativa 02/24/2021  . Chronic pain syndrome 01/16/2020  . Hx of AKA (above knee amputation), left (HCC)   . Tobacco use 03/01/2019  . Phantom limb pain (HCC) 11/08/2018  .  Anemia of chronic disease   . Bipolar affective disorder (HCC)   . Heart failure with preserved ejection fraction (HCC) 02/09/2017  . Healthcare maintenance 12/08/2016  . ESRD (end stage renal disease) (HCC) 07/15/2016  . OSA (obstructive sleep apnea) 05/13/2016  . Morbid obesity due to excess calories (HCC) 11/27/2015  . Subclinical hypothyroidism 04/25/2013  . Type 2 diabetes mellitus (HCC) 07/12/2012  . Carpal tunnel syndrome, bilateral 01/25/2012  . Diabetic polyneuropathy associated with type 2 diabetes mellitus (HCC) 07/04/2011  . Anxiety and depression 06/02/2011  . GERD 12/06/2007  . Hyperlipidemia 08/29/2007  . Primary hypertension 08/29/2007   Past Medical History:  Diagnosis Date  . Abdominal muscle pain 09/08/2016  . Abnormal Pap smear of cervix 2009  . Abscess    history of multiple abscesses  . Acute bilateral low back pain 02/13/2017  . Acute blood loss anemia    Hx  Iron infusions  . Acute on chronic renal failure (HCC) 07/12/2012  . Acute renal failure (HCC) 07/12/2012  . Acute respiratory failure with hypoxia (HCC) 10/21/2019  . Adjustment disorder with depressed mood 03/26/2017  . AKI (acute kidney injury) (HCC)    Dialysis t-th-s  . Anemia 10/21/2019  . Anemia of chronic disease 2002  . Anginal pain (HCC)   . Anxiety    Panic attacks  . Bilateral lower extremity edema 05/13/2016  . Bipolar disorder (HCC)   . Cataract    B/L cataract  . Cellulitis 05/21/2014   right eye  . CHF (congestive heart failure) (HCC)   . Chronic bronchitis (HCC)    "get it q yr" (05/13/2013)  . Chronic pain   . Chronic pain of right knee 09/08/2016  . Crutches as ambulation aid    uses forearm crutchs, Left  AKA with prosthetic.  Marland Kitchen Dehiscence of amputation stump (HCC)   . Depression   . Edema of lower extremity   . Endocarditis 2002   subacute bacterial endocarditis.   . Fall   . Family history of anesthesia complication    "my mom has a hard time coming out from  under"  . Fibromyalgia   . GERD (gastroesophageal reflux disease)    occ  . Heart murmur    never has had any problems  . Herpes simplex type 1 infection 01/16/2018  . History of blood transfusion    "just low blood count" (05/13/2013)  . History of sexually transmitted infection   . Hyperlipidemia   . Hypertension   . Hypothyroidism   . Hypothyroidism, adult 03/21/2014  . Hypoxia   . Leukocytosis   . Muscle spasm 12/16/2020  .  Necrosis (HCC)    and ulceration  . Necrotizing fasciitis s/p OR debridements 07/06/2012  . Obesity   . OSA on CPAP    does not use CPAP  . Peripheral neuropathy   . Pneumonia   . Pyelonephritis 12/31/2020  . Routine adult health maintenance 12/08/2016  . Severe protein-calorie malnutrition (HCC)   . Status post below knee amputation, left (HCC) 04/11/2017  . Type II diabetes mellitus (HCC)    Type  II  . Vaginal irritation 03/14/2016  . Weak pulse 07/21/2021  . Wound dehiscence 09/04/2019    Family History  Problem Relation Age of Onset  . Heart failure Mother   . Diabetes Mother   . Kidney disease Mother   . Kidney disease Father   . Diabetes Father   . Diabetes Paternal Grandmother   . Heart failure Paternal Grandmother   . Other Other     Past Surgical History:  Procedure Laterality Date  . A/V FISTULAGRAM Left 05/12/2021   Procedure: A/V FISTULAGRAM;  Surgeon: Victorino Sparrow, MD;  Location: [facility] INVASIVE CV LAB;  Service: Cardiovascular;  Laterality: Left;  . ABDOMINAL AORTOGRAM W/LOWER EXTREMITY Left 03/19/2019   Procedure: ABDOMINAL AORTOGRAM W/LOWER EXTREMITY;  Surgeon: Nada Libman, MD;  Location: [facility] INVASIVE CV LAB;  Service: Cardiovascular;  Laterality: Left;  . ABOVE KNEE LEG AMPUTATION Left 10/11/2019   wound dehisence   . AIR/FLUID EXCHANGE Right 12/31/2019   Procedure: AIR/FLUID EXCHANGE;  Surgeon: Carmela Rima, MD;  Location: [facility] OR;  Service: Ophthalmology;  Laterality: Right;  . AMPUTATION Left 04/07/2017    Procedure: LEFT BELOW KNEE AMPUTATION;  Surgeon: Nadara Mustard, MD;  Location: [facility] OR;  Service: Orthopedics;  Laterality: Left;  . AMPUTATION Left 10/11/2019   Procedure: LEFT ABOVE KNEE AMPUTATION;  Surgeon: Nadara Mustard, MD;  Location: [facility] OR;  Service: Orthopedics;  Laterality: Left;  . AV FISTULA PLACEMENT Left 01/05/2021   Procedure: ARTERIOVENOUS (AV) FISTULA CREATION vs. GRAFT;  Surgeon: Chuck Hint, MD;  Location: [facility] OR;  Service: Vascular;  Laterality: Left;  . CATARACT EXTRACTION W/PHACO Right 02/11/2020   Procedure: CATARACT EXTRACTION PHACO AND INTRAOCULAR LENS PLACEMENT (IOC);  Surgeon: Carmela Rima, MD;  Location: [facility] OR;  Service: Ophthalmology;  Laterality: Right;  . EYE SURGERY     lazer  . FISTULA SUPERFICIALIZATION Left 02/23/2021   Procedure: SUPERFICIALIZATION OF LEFT BRACHIOCEPHALIC FISTULA;  Surgeon: Chuck Hint, MD;  Location: [facility] OR;  Service: Vascular;  Laterality: Left;  Marland Kitchen GAS INSERTION Right 12/31/2019   Procedure: INSERTION OF GAS;  Surgeon: Carmela Rima, MD;  Location: [facility] OR;  Service: Ophthalmology;  Laterality: Right;  . INCISION AND DRAINAGE ABSCESS     multiple I&Ds  . INCISION AND DRAINAGE ABSCESS Left 07/09/2012   Procedure: DRESSING CHANGE, THIGH WOUND;  Surgeon: Shelly Rubenstein, MD;  Location: [facility] OR;  Service: General;  Laterality: Left;  . INCISION AND DRAINAGE OF WOUND Left 07/07/2012   Procedure: IRRIGATION AND DEBRIDEMENT WOUND;  Surgeon: Shelly Rubenstein, MD;  Location: [facility] OR;  Service: General;  Laterality: Left;  . INCISION AND DRAINAGE PERIRECTAL ABSCESS Left 07/14/2012   Procedure: DEBRIDEMENT OF SKIN & SOFT TISSUE; DRESSING CHANGE UNDER ANESTHESIA;  Surgeon: Atilano Ina, MD,FACS;  Location: [facility] OR;  Service: General;  Laterality: Left;  . INCISION AND DRAINAGE PERIRECTAL ABSCESS Left 07/16/2012   Procedure: I&D Left Thigh;  Surgeon: Cherylynn Ridges, MD;  Location: [facility] OR;  Service: General;  Laterality: Left;  .  INCISION  AND DRAINAGE PERIRECTAL ABSCESS N/A 01/05/2015   Procedure: IRRIGATION AND DEBRIDEMENT PERIRECTAL ABSCESS;  Surgeon: Manus Rudd, MD;  Location: [facility] OR;  Service: General;  Laterality: N/A;  . INSERTION OF DIALYSIS CATHETER Right 01/05/2021   Procedure: INSERTION OF DIALYSIS CATHETER;  Surgeon: Chuck Hint, MD;  Location: [facility] OR;  Service: Vascular;  Laterality: Right;  . IRRIGATION AND DEBRIDEMENT ABSCESS Left 07/06/2012   Procedure: IRRIGATION AND DEBRIDEMENT ABSCESS BUTTOCKS AND THIGH;  Surgeon: Kandis Cocking, MD;  Location: [facility] OR;  Service: General;  Laterality: Left;  . IRRIGATION AND DEBRIDEMENT ABSCESS Left 08/10/2012   Procedure: IRRIGATION AND DEBRIDEMENT ABSCESS;  Surgeon: Lodema Pilot, DO;  Location: [facility] OR;  Service: General;  Laterality: Left;  . LEFT HEART CATH AND CORONARY ANGIOGRAPHY N/A 10/31/2022   Procedure: LEFT HEART CATH AND CORONARY ANGIOGRAPHY;  Surgeon: Lennette Bihari, MD;  Location: [facility] INVASIVE CV LAB;  Service: Cardiovascular;  Laterality: N/A;  . MEMBRANE PEEL Left 07/24/2022   Procedure: MEMBRANE PEEL;  Surgeon: Carmela Rima, MD;  Location: [facility] OR;  Service: Ophthalmology;  Laterality: Left;  . PARS PLANA VITRECTOMY Right 12/31/2019   Procedure: PARS PLANA VITRECTOMY WITH 25 GAUGE;  Surgeon: Carmela Rima, MD;  Location: [facility] OR;  Service: Ophthalmology;  Laterality: Right;  . PARS PLANA VITRECTOMY Left 07/24/2022   Procedure: PARS PLANA VITRECTOMY WITH 25 GAUGE;  Surgeon: Carmela Rima, MD;  Location: [facility] OR;  Service: Ophthalmology;  Laterality: Left;  . PERIPHERAL VASCULAR INTERVENTION Left 05/12/2021   Procedure: PERIPHERAL VASCULAR INTERVENTION;  Surgeon: Victorino Sparrow, MD;  Location: [facility] INVASIVE CV LAB;  Service: Cardiovascular;  Laterality: Left;  arm fistula  . PHOTOCOAGULATION Left 07/24/2022   Procedure: AIRGAS, SILICONE OIL, PHOTOCOAGULATION;  Surgeon: Carmela Rima, MD;  Location: [facility] OR;  Service: Ophthalmology;  Laterality: Left;  .  PHOTOCOAGULATION WITH LASER Right 12/31/2019   Procedure: PHOTOCOAGULATION WITH LASER;  Surgeon: Carmela Rima, MD;  Location: [facility] OR;  Service: Ophthalmology;  Laterality: Right;  . REPAIR OF COMPLEX TRACTION RETINAL DETACHMENT Right 12/31/2019   Procedure: REPAIR OF COMPLEX TRACTION RETINAL;  Surgeon: Carmela Rima, MD;  Location: [facility] OR;  Service: Ophthalmology;  Laterality: Right;  . REPAIR OF COMPLEX TRACTION RETINAL DETACHMENT Left 07/24/2022   Procedure: REPAIR OF COMPLEX TRACTION RETINAL DETACHMENT;  Surgeon: Carmela Rima, MD;  Location: [facility] OR;  Service: Ophthalmology;  Laterality: Left;  . STUMP REVISION Left 06/09/2017   Procedure: REVISION LEFT BELOW KNEE AMPUTATION;  Surgeon: Nadara Mustard, MD;  Location: [facility] OR;  Service: Orthopedics;  Laterality: Left;  . STUMP REVISION Left 09/04/2019   Procedure: LEFT BELOW KNEE AMPUTATION REVISION;  Surgeon: Nadara Mustard, MD;  Location: [facility] OR;  Service: Orthopedics;  Laterality: Left;  . STUMP REVISION Left 09/20/2019   Procedure: LEFT BELOW KNEE AMPUTATION REVISION;  Surgeon: Nadara Mustard, MD;  Location: [facility] OR;  Service: Orthopedics;  Laterality: Left;  . TONSILLECTOMY  1994   Social History   Occupational History  . Occupation: disability  Tobacco Use  . Smoking status: Former    Current packs/day: 0.00    Average packs/day: 0.5 packs/day for 16.0 years (8.0 ttl pk-yrs)    Types: Cigarettes, E-cigarettes    Start date: 08/2005    Quit date: 08/2021    Years since quitting: 1.3  . Smokeless tobacco: Never  Vaping Use  . Vaping status: Some Days  . Substances: Nicotine, THC, CBD, Flavoring  . Devices: CBD oil  Substance and Sexual Activity  . Alcohol use:  Yes    Alcohol/week: 0.0 standard drinks of alcohol    Comment: social  . Drug use: Not Currently    Types: Marijuana  . Sexual activity: Yes    Birth control/protection: Condom

## 2023-01-12 DIAGNOSIS — D631 Anemia in chronic kidney disease: Secondary | ICD-10-CM | POA: Diagnosis not present

## 2023-01-12 DIAGNOSIS — D509 Iron deficiency anemia, unspecified: Secondary | ICD-10-CM | POA: Diagnosis not present

## 2023-01-12 DIAGNOSIS — D689 Coagulation defect, unspecified: Secondary | ICD-10-CM | POA: Diagnosis not present

## 2023-01-12 DIAGNOSIS — N186 End stage renal disease: Secondary | ICD-10-CM | POA: Diagnosis not present

## 2023-01-12 DIAGNOSIS — N2581 Secondary hyperparathyroidism of renal origin: Secondary | ICD-10-CM | POA: Diagnosis not present

## 2023-01-12 DIAGNOSIS — L299 Pruritus, unspecified: Secondary | ICD-10-CM | POA: Diagnosis not present

## 2023-01-12 DIAGNOSIS — Z992 Dependence on renal dialysis: Secondary | ICD-10-CM | POA: Diagnosis not present

## 2023-01-14 DIAGNOSIS — Z992 Dependence on renal dialysis: Secondary | ICD-10-CM | POA: Diagnosis not present

## 2023-01-14 DIAGNOSIS — N2581 Secondary hyperparathyroidism of renal origin: Secondary | ICD-10-CM | POA: Diagnosis not present

## 2023-01-14 DIAGNOSIS — N186 End stage renal disease: Secondary | ICD-10-CM | POA: Diagnosis not present

## 2023-01-14 DIAGNOSIS — D509 Iron deficiency anemia, unspecified: Secondary | ICD-10-CM | POA: Diagnosis not present

## 2023-01-14 DIAGNOSIS — D689 Coagulation defect, unspecified: Secondary | ICD-10-CM | POA: Diagnosis not present

## 2023-01-14 DIAGNOSIS — L299 Pruritus, unspecified: Secondary | ICD-10-CM | POA: Diagnosis not present

## 2023-01-14 DIAGNOSIS — D631 Anemia in chronic kidney disease: Secondary | ICD-10-CM | POA: Diagnosis not present

## 2023-01-17 DIAGNOSIS — N2581 Secondary hyperparathyroidism of renal origin: Secondary | ICD-10-CM | POA: Diagnosis not present

## 2023-01-17 DIAGNOSIS — D689 Coagulation defect, unspecified: Secondary | ICD-10-CM | POA: Diagnosis not present

## 2023-01-17 DIAGNOSIS — Z992 Dependence on renal dialysis: Secondary | ICD-10-CM | POA: Diagnosis not present

## 2023-01-17 DIAGNOSIS — D631 Anemia in chronic kidney disease: Secondary | ICD-10-CM | POA: Diagnosis not present

## 2023-01-17 DIAGNOSIS — L299 Pruritus, unspecified: Secondary | ICD-10-CM | POA: Diagnosis not present

## 2023-01-17 DIAGNOSIS — N186 End stage renal disease: Secondary | ICD-10-CM | POA: Diagnosis not present

## 2023-01-17 DIAGNOSIS — D509 Iron deficiency anemia, unspecified: Secondary | ICD-10-CM | POA: Diagnosis not present

## 2023-01-19 DIAGNOSIS — D689 Coagulation defect, unspecified: Secondary | ICD-10-CM | POA: Diagnosis not present

## 2023-01-19 DIAGNOSIS — N2581 Secondary hyperparathyroidism of renal origin: Secondary | ICD-10-CM | POA: Diagnosis not present

## 2023-01-19 DIAGNOSIS — N186 End stage renal disease: Secondary | ICD-10-CM | POA: Diagnosis not present

## 2023-01-19 DIAGNOSIS — D509 Iron deficiency anemia, unspecified: Secondary | ICD-10-CM | POA: Diagnosis not present

## 2023-01-19 DIAGNOSIS — D631 Anemia in chronic kidney disease: Secondary | ICD-10-CM | POA: Diagnosis not present

## 2023-01-19 DIAGNOSIS — L299 Pruritus, unspecified: Secondary | ICD-10-CM | POA: Diagnosis not present

## 2023-01-19 DIAGNOSIS — Z992 Dependence on renal dialysis: Secondary | ICD-10-CM | POA: Diagnosis not present

## 2023-01-21 DIAGNOSIS — D631 Anemia in chronic kidney disease: Secondary | ICD-10-CM | POA: Diagnosis not present

## 2023-01-21 DIAGNOSIS — E1122 Type 2 diabetes mellitus with diabetic chronic kidney disease: Secondary | ICD-10-CM | POA: Diagnosis not present

## 2023-01-21 DIAGNOSIS — L299 Pruritus, unspecified: Secondary | ICD-10-CM | POA: Diagnosis not present

## 2023-01-21 DIAGNOSIS — D509 Iron deficiency anemia, unspecified: Secondary | ICD-10-CM | POA: Diagnosis not present

## 2023-01-21 DIAGNOSIS — Z992 Dependence on renal dialysis: Secondary | ICD-10-CM | POA: Diagnosis not present

## 2023-01-21 DIAGNOSIS — N2581 Secondary hyperparathyroidism of renal origin: Secondary | ICD-10-CM | POA: Diagnosis not present

## 2023-01-21 DIAGNOSIS — N186 End stage renal disease: Secondary | ICD-10-CM | POA: Diagnosis not present

## 2023-01-21 DIAGNOSIS — D689 Coagulation defect, unspecified: Secondary | ICD-10-CM | POA: Diagnosis not present

## 2023-01-25 ENCOUNTER — Ambulatory Visit (INDEPENDENT_AMBULATORY_CARE_PROVIDER_SITE_OTHER): Payer: 59 | Admitting: Student

## 2023-01-25 VITALS — BP 138/79 | HR 93 | Temp 98.0°F | Ht 69.0 in

## 2023-01-25 DIAGNOSIS — M109 Gout, unspecified: Secondary | ICD-10-CM

## 2023-01-25 DIAGNOSIS — E1159 Type 2 diabetes mellitus with other circulatory complications: Secondary | ICD-10-CM

## 2023-01-25 DIAGNOSIS — E1122 Type 2 diabetes mellitus with diabetic chronic kidney disease: Secondary | ICD-10-CM | POA: Diagnosis not present

## 2023-01-25 DIAGNOSIS — N186 End stage renal disease: Secondary | ICD-10-CM | POA: Diagnosis not present

## 2023-01-25 DIAGNOSIS — Z23 Encounter for immunization: Secondary | ICD-10-CM | POA: Diagnosis not present

## 2023-01-25 DIAGNOSIS — I120 Hypertensive chronic kidney disease with stage 5 chronic kidney disease or end stage renal disease: Secondary | ICD-10-CM

## 2023-01-25 DIAGNOSIS — E038 Other specified hypothyroidism: Secondary | ICD-10-CM | POA: Diagnosis not present

## 2023-01-25 DIAGNOSIS — Z89612 Acquired absence of left leg above knee: Secondary | ICD-10-CM | POA: Diagnosis not present

## 2023-01-25 DIAGNOSIS — F419 Anxiety disorder, unspecified: Secondary | ICD-10-CM

## 2023-01-25 DIAGNOSIS — F32A Depression, unspecified: Secondary | ICD-10-CM

## 2023-01-25 DIAGNOSIS — I10 Essential (primary) hypertension: Secondary | ICD-10-CM

## 2023-01-25 DIAGNOSIS — I25119 Atherosclerotic heart disease of native coronary artery with unspecified angina pectoris: Secondary | ICD-10-CM

## 2023-01-25 DIAGNOSIS — Z0000 Encounter for general adult medical examination without abnormal findings: Secondary | ICD-10-CM

## 2023-01-25 MED ORDER — ALLOPURINOL 100 MG PO TABS
ORAL_TABLET | ORAL | 1 refills | Status: DC
Start: 2023-01-25 — End: 2023-12-27

## 2023-01-25 MED ORDER — MOUNJARO 10 MG/0.5ML ~~LOC~~ SOAJ: 10.0000 mg | SUBCUTANEOUS | 0 refills | Status: DC

## 2023-01-25 MED ORDER — VITAMIN D (ERGOCALCIFEROL) 1.25 MG (50000 UNIT) PO CAPS
50000.0000 [IU] | ORAL_CAPSULE | ORAL | 0 refills | Status: AC
Start: 2023-01-25 — End: ?

## 2023-01-25 MED ORDER — AMLODIPINE BESYLATE 10 MG PO TABS: 10.0000 mg | ORAL_TABLET | Freq: Every day | ORAL | 1 refills | Status: DC

## 2023-01-25 MED ORDER — METOPROLOL SUCCINATE ER 25 MG PO TB24: 25.0000 mg | ORAL_TABLET | Freq: Every day | ORAL | 1 refills | Status: DC

## 2023-01-25 NOTE — Progress Notes (Addendum)
CC: Follow-Up   HPI: [name] [name] is a 47 y.o. female living with a history stated below and presents today for follow-up and wheelchair referral. Please see problem based assessment and plan for additional details.  Past Medical History:  Diagnosis Date   Abdominal muscle pain 09/08/2016   Abnormal Pap smear of cervix 2009   Abscess    history of multiple abscesses   Acute bilateral low back pain 02/13/2017   Acute blood loss anemia    Hx  Iron infusions   Acute on chronic renal failure (HCC) 07/12/2012   Acute renal failure (HCC) 07/12/2012   Acute respiratory failure with hypoxia (HCC) 10/21/2019   Adjustment disorder with depressed mood 03/26/2017   AKI (acute kidney injury) (HCC)    Dialysis t-th-s   Anemia 10/21/2019   Anemia of chronic disease 2002   Anginal pain (HCC)    Anxiety    Panic attacks   Bilateral lower extremity edema 05/13/2016   Bipolar disorder (HCC)    Cataract    B/L cataract   Cellulitis 05/21/2014   right eye   CHF (congestive heart failure) (HCC)    Chronic bronchitis (HCC)    "get it q yr" (05/13/2013)   Chronic pain    Chronic pain of right knee 09/08/2016   Crutches as ambulation aid    uses forearm crutchs, Left  AKA with prosthetic.   Dehiscence of amputation stump (HCC)    Depression    Edema of lower extremity    Endocarditis 2002   subacute bacterial endocarditis.    Fall    Family history of anesthesia complication    "my mom has a hard time coming out from under"   Fibromyalgia    GERD (gastroesophageal reflux disease)    occ   Heart murmur    never has had any problems   Herpes simplex type 1 infection 01/16/2018   History of blood transfusion    "just low blood count" (05/13/2013)   History of sexually transmitted infection    Hyperlipidemia    Hypertension    Hypothyroidism    Hypothyroidism, adult 03/21/2014   Hypoxia    Leukocytosis    Muscle spasm 12/16/2020   Necrosis (HCC)    and ulceration    Necrotizing fasciitis s/p OR debridements 07/06/2012   Obesity    OSA on CPAP    does not use CPAP   Peripheral neuropathy    Pneumonia    Pyelonephritis 12/31/2020   Routine adult health maintenance 12/08/2016   Severe protein-calorie malnutrition (HCC)    Status post below knee amputation, left (HCC) 04/11/2017   Type II diabetes mellitus (HCC)    Type  II   Vaginal irritation 03/14/2016   Weak pulse 07/21/2021   Wound dehiscence 09/04/2019    Current Outpatient Medications on File Prior to Visit  Medication Sig Dispense Refill   aspirin 81 MG chewable tablet Chew 1 tablet (81 mg total) by mouth daily. 90 tablet 0   atorvastatin (LIPITOR) 80 MG tablet Take 1 tablet (80 mg total) by mouth daily. 30 tablet 0   Benzoyl Peroxide 2.5 % CREA Apply 1 drop topically daily. 21 g 0   blood glucose meter kit and supplies KIT ICD 10- E11.69. Based on patient and insurance preference. Use up to four times daily as directed. 1 each 0   buPROPion (WELLBUTRIN) 100 MG tablet Take 1 tablet (100 mg total) by mouth every other day. 30 tablet 1   cyclobenzaprine (FLEXERIL)  5 MG tablet Take 1 tablet by mouth daily as needed for muscle spasms. (Patient not taking: Reported on 12/28/2022) 30 tablet 0   diclofenac Sodium (VOLTAREN) 1 % GEL APPLY 1 GRAM TOPICALLY TO AFFECTED AREA 4 TIMES DAILY AS NEEDED (Patient taking differently: Apply 1 g topically 4 (four) times daily as needed (for pain).) 100 g 2   fluticasone (FLONASE) 50 MCG/ACT nasal spray Place 1 spray into both nostrils daily. (Patient taking differently: Place 1 spray into both nostrils daily as needed for allergies.) 11.1 mL 2   furosemide (LASIX) 20 MG tablet Take 1 tablet by mouth daily. Take on non-dialysis days. Adjust per kidney docs (Patient taking differently: Take 20 mg by mouth daily. Take on non-dialysis days. Adjust per kidney docs) 90 tablet 1   hydrOXYzine (ATARAX) 25 MG tablet Take 1 tablet (25 mg total) by mouth 3 (three) times daily as  needed for anxiety. 90 tablet 0   Insulin Pen Needle (TRUEPLUS PEN NEEDLES) 31G X 5 MM MISC 1 each by Other route 3 (three) times daily. 100 each 5   ketoconazole (NIZORAL) 2 % cream Apply 1 application topically to affected area daily. 15 g 2   levothyroxine (SYNTHROID) 88 MCG tablet Take 88 mcg by mouth daily.     ofloxacin (OCUFLOX) 0.3 % ophthalmic solution Place 1 drop into the left eye in the morning and at bedtime.     oxyCODONE-acetaminophen (PERCOCET) 7.5-325 MG tablet Take 1 tablet by mouth every 8 (eight) hours as needed for moderate pain.     sevelamer carbonate (RENVELA) 800 MG tablet Take 6 to 7 tablets by mouth twice a day with meals (Patient not taking: Reported on 12/28/2022)     tretinoin (RETIN-A) 0.025 % cream Apply topically at bedtime. 45 g 0   No current facility-administered medications on file prior to visit.    Family History  Problem Relation Age of Onset   Heart failure Mother    Diabetes Mother    Kidney disease Mother    Kidney disease Father    Diabetes Father    Diabetes Paternal Grandmother    Heart failure Paternal Grandmother    Other Other     Social History   Socioeconomic History   Marital status: Single    Spouse name: Not on file   Number of children: 0   Years of education: 11   Highest education level: Not on file  Occupational History   Occupation: disability  Tobacco Use   Smoking status: Former    Current packs/day: 0.00    Average packs/day: 0.5 packs/day for 16.0 years (8.0 ttl pk-yrs)    Types: Cigarettes, E-cigarettes    Start date: 08/2005    Quit date: 08/2021    Years since quitting: 1.4   Smokeless tobacco: Never  Vaping Use   Vaping status: Some Days   Substances: Nicotine, THC, CBD, Flavoring   Devices: CBD oil  Substance and Sexual Activity   Alcohol use: Yes    Alcohol/week: 0.0 standard drinks of alcohol    Comment: social   Drug use: Not Currently    Types: Marijuana   Sexual activity: Yes    Birth  control/protection: Condom  Other Topics Concern   Not on file  Social History Narrative   Lives with mother currently, goddaughter and uncle in a one story home.     On disability for diabetes, neuropathy, sleep apnea etc.  Disability since 2014.    Education: 11th grade.  Social Determinants of Health   Financial Resource Strain: High Risk (12/28/2022)   Overall Financial Resource Strain (CARDIA)    Difficulty of Paying Living Expenses: Hard  Food Insecurity: Food Insecurity Present (12/28/2022)   Hunger Vital Sign    Worried About Running Out of Food in the Last Year: Often true    Ran Out of Food in the Last Year: Often true  Transportation Needs: Unmet Transportation Needs (12/28/2022)   PRAPARE - Administrator, Civil Service (Medical): Yes    Lack of Transportation (Non-Medical): Yes  Physical Activity: Inactive (12/28/2022)   Exercise Vital Sign    Days of Exercise per Week: 0 days    Minutes of Exercise per Session: 0 min  Stress: No Stress Concern Present (12/28/2022)   [employer] of Occupational Health - Occupational Stress Questionnaire    Feeling of Stress : Only a little  Social Connections: Moderately Isolated (12/28/2022)   Social Connection and Isolation Panel [NHANES]    Frequency of Communication with Friends and Family: More than three times a week    Frequency of Social Gatherings with Friends and Family: Once a week    Attends Religious Services: 1 to 4 times per year    Active Member of [employer] or Organizations: No    Attends Banker Meetings: Never    Marital Status: Widowed  Intimate Partner Violence: Not At Risk (12/28/2022)   Humiliation, Afraid, Rape, and Kick questionnaire    Fear of Current or Ex-Partner: No    Emotionally Abused: No    Physically Abused: No    Sexually Abused: No    Review of Systems: ROS negative except for what is noted on the assessment and plan.  Vitals:   01/25/23 1335  BP: 138/79  Pulse: 93   Temp: 98 F (36.7 C)  TempSrc: Oral  SpO2: 100%  Height: 5\' 9"  (1.753 m)    Physical Exam: Constitutional: well-appearing  in no acute distress HENT: normocephalic atraumatic, mucous membranes moist Eyes: conjunctiva non-erythematous Neck: supple Cardiovascular: regular rate and rhythm, no m/r/g, LE edema present  Pulmonary/Chest: normal work of breathing on room air, lungs clear to auscultation bilaterally Abdominal: soft, non-tender, non-distended MSK: normal bulk and tone Neurological: alert & oriented x 3 Skin: warm and dry  Assessment & Plan:   Primary hypertension Blood pressure was normal today at 138/79.  During her last visit, she was noted to be hypotensive that resolved after food and drink.  She had her lisinopril discontinued as well, and this might have been the cause of her hypotension.  Today, she notes that she has been compliant with her medications.  She denies any headache, nausea, vomiting, or lightheadedness.  She denies taking her blood pressure at home.  She needs refills on her medications. -Continue amlodipine 10 mg   Type 2 diabetes mellitus (HCC) Her last A1c was on 11/14/2022 and was 6.6.  Since this time, she denies any signs or symptoms associated with high or low levels of glucose.  She has not been taking her glucose levels at home, but this is okay as she is only taking the [facility].  We performed her foot exam today.  We also encouraged her to follow-up with her ophthalmologist.  She sees Dr. Allena Katz at [facility].  We will see her again in 3 weeks for a lab visit to recheck her A1c. - Follow-up A1c in 3 weeks - Continue Mounjaro 10 mg weekly - Follow-up with ophthalmology appointment  ESRD (end stage renal disease) [facility]) Patient continues with hemodialysis on Tuesdays, Thursdays, and Saturdays.  She notes missing her session on Tuesday as she inadvertently slept in.  Today, she is asymptomatic.  She is taking vitamin D on her dialysis days.  We  encouraged her to make sure she attends her dialysis session tomorrow.  She is followed by nephrology. - Continue vitamin D on dialysis days  Healthcare maintenance She received her flu shot today.   Subclinical hypothyroidism Patient last had her TSH drawn in June, and it was normal at 3.123. She denies any lightheadedness, dizziness, or other signs or symptoms today.  We will continue to monitor signs and symptoms of hypothyroidism but will hold levothyroxine during this time.   Contacted patient on 01/26/23. Confirmed that she was no longer taking levothyroxine as this was not refilled for an unknown reason. At this time, patient denied any weight loss, cold intolerance, or other signs or symptoms. I let the patient know of what symptoms to look out for regarding hypothyroidism and to reach out to [place] if she experiences any of these signs or symptoms.   Anxiety and depression She continues to do well with hydroxyzine and bupropion.  Her mood has remained improved since her hospitalization.  We will continue her medication regimen. - Continue bupropion 100 mg every other day - Continue hydroxyzine 25 mg 3 times daily as needed  CAD (coronary artery disease) She was hospitalized on 10/30/2022 for type II NSTEMI.  Her TTE showed an EF of 60 to 65%.  Since last seen in the clinic, she continues to not have any chest pain, shortness of breath, or other signs or symptoms.  She needs refills. - Continue 81 mg of aspirin daily, 80 mg of atorvastatin daily, and 25 mg of metoprolol nightly   Patient seen with Dr. Mariea Stable, MD  [facility] Internal Medicine, PGY-1 Date 01/27/2023 Time 9:30 AM

## 2023-01-25 NOTE — Assessment & Plan Note (Signed)
She received her flu shot today.  

## 2023-01-25 NOTE — Assessment & Plan Note (Signed)
She was hospitalized on 10/30/2022 for type II NSTEMI.  Her TTE showed an EF of 60 to 65%.  Since last seen in the clinic, she continues to not have any chest pain, shortness of breath, or other signs or symptoms.  She needs refills. - Continue 81 mg of aspirin daily, 80 mg of atorvastatin daily, and 25 mg of metoprolol nightly

## 2023-01-25 NOTE — Assessment & Plan Note (Signed)
Her last A1c was on 11/14/2022 and was 6.6.  Since this time, she denies any signs or symptoms associated with high or low levels of glucose.  She has not been taking her glucose levels at home, but this is okay as she is only taking the [facility].  We performed her foot exam today.  We also encouraged her to follow-up with her ophthalmologist.  She sees Dr. Allena Katz at [facility].  We will see her again in 3 weeks for a lab visit to recheck her A1c. - Follow-up A1c in 3 weeks - Continue Mounjaro 10 mg weekly - Follow-up with ophthalmology appointment

## 2023-01-25 NOTE — Patient Instructions (Addendum)
Thank you so much for coming to the clinic today!   Today, we discussed your diabetes, hypertension, ESRD, and CAD.  I have put in an order for your wheelchair referral.  If you have any issues, please reach out to the clinic.  I have also put in refills for your medications.  We also gave you a flu shot.  Please remember to attend your dialysis session tomorrow.  Please follow-up in 3 weeks for a lab visit to obtain your A1c.  If you have any questions please feel free to the call the clinic at anytime at [phone]. It was a pleasure seeing you!  Best, Dr. Rayvon Char

## 2023-01-25 NOTE — Assessment & Plan Note (Signed)
She continues to do well with hydroxyzine and bupropion.  Her mood has remained improved since her hospitalization.  We will continue her medication regimen. - Continue bupropion 100 mg every other day - Continue hydroxyzine 25 mg 3 times daily as needed

## 2023-01-25 NOTE — Assessment & Plan Note (Addendum)
Patient last had her TSH drawn in June, and it was normal at 3.123. She denies any lightheadedness, dizziness, or other signs or symptoms today.  We will continue to monitor signs and symptoms of hypothyroidism but will hold levothyroxine during this time.   Contacted patient on 01/26/23. Confirmed that she was no longer taking levothyroxine as this was not refilled for an unknown reason. At this time, patient denied any weight loss, cold intolerance, or other signs or symptoms. I let the patient know of what symptoms to look out for regarding hypothyroidism and to reach out to [place] if she experiences any of these signs or symptoms.

## 2023-01-25 NOTE — Assessment & Plan Note (Signed)
Patient continues with hemodialysis on Tuesdays, Thursdays, and Saturdays.  She notes missing her session on Tuesday as she inadvertently slept in.  Today, she is asymptomatic.  She is taking vitamin D on her dialysis days.  We encouraged her to make sure she attends her dialysis session tomorrow.  She is followed by nephrology. - Continue vitamin D on dialysis days

## 2023-01-25 NOTE — Assessment & Plan Note (Addendum)
Blood pressure was normal today at 138/79.  During her last visit, she was noted to be hypotensive that resolved after food and drink.  She had her lisinopril discontinued as well, and this might have been the cause of her hypotension.  Today, she notes that she has been compliant with her medications.  She denies any headache, nausea, vomiting, or lightheadedness.  She denies taking her blood pressure at home.  She needs refills on her medications. -Continue amlodipine 10 mg

## 2023-01-26 DIAGNOSIS — L299 Pruritus, unspecified: Secondary | ICD-10-CM | POA: Diagnosis not present

## 2023-01-26 DIAGNOSIS — D689 Coagulation defect, unspecified: Secondary | ICD-10-CM | POA: Diagnosis not present

## 2023-01-26 DIAGNOSIS — Z992 Dependence on renal dialysis: Secondary | ICD-10-CM | POA: Diagnosis not present

## 2023-01-26 DIAGNOSIS — D631 Anemia in chronic kidney disease: Secondary | ICD-10-CM | POA: Diagnosis not present

## 2023-01-26 DIAGNOSIS — N186 End stage renal disease: Secondary | ICD-10-CM | POA: Diagnosis not present

## 2023-01-26 DIAGNOSIS — N2581 Secondary hyperparathyroidism of renal origin: Secondary | ICD-10-CM | POA: Diagnosis not present

## 2023-01-26 DIAGNOSIS — D509 Iron deficiency anemia, unspecified: Secondary | ICD-10-CM | POA: Diagnosis not present

## 2023-01-26 NOTE — Progress Notes (Signed)
Internal Medicine Clinic Attending  I was physically present during the key portions of the resident provided service and participated in the medical decision making of patient's management care. I reviewed pertinent patient test results.  The assessment, diagnosis, and plan were formulated together and I agree with the documentation in the resident's note with the following correction:  Levothyroxine was not discontinued during hospitalization in June 2024 and appears to be on the continued medication list from the hospital dc summary. However, chart review shows [name] [name] has been off/on levothyroxine for subclinical hypothyroidism for years and fill report shows last dispense was February 2023. TSH wnl June 2024. I have asked Dr. Rayvon Char to call Ms. Beshears to clarify whether she has continued to take this medication. Will need to plan repeat lab work pending this discussion.  DME referral for new manual wheelchair placed given poor condition of current wheelchair and mobility requirement given hx of left AKA.   Dickie La, MD

## 2023-01-26 NOTE — Addendum Note (Signed)
Addended by: Dickie La on: 01/26/2023 02:28 PM   Modules accepted: Level of Service

## 2023-01-27 NOTE — Progress Notes (Signed)
Contacted patient on 01/26/23 regarding history of subclinical hypothyroidism and levothyroxine. Confirmed that she was no longer taking levothyroxine as this was not refilled for an unknown reason. At this time, patient denied any weight loss, cold intolerance, or other signs or symptoms. I let the patient know of what symptoms to look out for regarding hypothyroidism and to reach out to [place] if she experiences any of these signs or symptoms. Will f/u with TSH at next visit.

## 2023-01-28 DIAGNOSIS — L299 Pruritus, unspecified: Secondary | ICD-10-CM | POA: Diagnosis not present

## 2023-01-28 DIAGNOSIS — N2581 Secondary hyperparathyroidism of renal origin: Secondary | ICD-10-CM | POA: Diagnosis not present

## 2023-01-28 DIAGNOSIS — N186 End stage renal disease: Secondary | ICD-10-CM | POA: Diagnosis not present

## 2023-01-28 DIAGNOSIS — D509 Iron deficiency anemia, unspecified: Secondary | ICD-10-CM | POA: Diagnosis not present

## 2023-01-28 DIAGNOSIS — D631 Anemia in chronic kidney disease: Secondary | ICD-10-CM | POA: Diagnosis not present

## 2023-01-28 DIAGNOSIS — D689 Coagulation defect, unspecified: Secondary | ICD-10-CM | POA: Diagnosis not present

## 2023-01-28 DIAGNOSIS — Z992 Dependence on renal dialysis: Secondary | ICD-10-CM | POA: Diagnosis not present

## 2023-01-31 DIAGNOSIS — D631 Anemia in chronic kidney disease: Secondary | ICD-10-CM | POA: Diagnosis not present

## 2023-01-31 DIAGNOSIS — L299 Pruritus, unspecified: Secondary | ICD-10-CM | POA: Diagnosis not present

## 2023-01-31 DIAGNOSIS — D689 Coagulation defect, unspecified: Secondary | ICD-10-CM | POA: Diagnosis not present

## 2023-01-31 DIAGNOSIS — N2581 Secondary hyperparathyroidism of renal origin: Secondary | ICD-10-CM | POA: Diagnosis not present

## 2023-01-31 DIAGNOSIS — Z992 Dependence on renal dialysis: Secondary | ICD-10-CM | POA: Diagnosis not present

## 2023-01-31 DIAGNOSIS — N186 End stage renal disease: Secondary | ICD-10-CM | POA: Diagnosis not present

## 2023-01-31 DIAGNOSIS — D509 Iron deficiency anemia, unspecified: Secondary | ICD-10-CM | POA: Diagnosis not present

## 2023-02-02 DIAGNOSIS — N2581 Secondary hyperparathyroidism of renal origin: Secondary | ICD-10-CM | POA: Diagnosis not present

## 2023-02-02 DIAGNOSIS — Z992 Dependence on renal dialysis: Secondary | ICD-10-CM | POA: Diagnosis not present

## 2023-02-02 DIAGNOSIS — L299 Pruritus, unspecified: Secondary | ICD-10-CM | POA: Diagnosis not present

## 2023-02-02 DIAGNOSIS — D631 Anemia in chronic kidney disease: Secondary | ICD-10-CM | POA: Diagnosis not present

## 2023-02-02 DIAGNOSIS — D689 Coagulation defect, unspecified: Secondary | ICD-10-CM | POA: Diagnosis not present

## 2023-02-02 DIAGNOSIS — D509 Iron deficiency anemia, unspecified: Secondary | ICD-10-CM | POA: Diagnosis not present

## 2023-02-02 DIAGNOSIS — N186 End stage renal disease: Secondary | ICD-10-CM | POA: Diagnosis not present

## 2023-02-03 DIAGNOSIS — N186 End stage renal disease: Secondary | ICD-10-CM | POA: Diagnosis not present

## 2023-02-03 DIAGNOSIS — M25562 Pain in left knee: Secondary | ICD-10-CM | POA: Diagnosis not present

## 2023-02-03 DIAGNOSIS — G546 Phantom limb syndrome with pain: Secondary | ICD-10-CM | POA: Diagnosis not present

## 2023-02-03 DIAGNOSIS — M79643 Pain in unspecified hand: Secondary | ICD-10-CM | POA: Diagnosis not present

## 2023-02-03 DIAGNOSIS — Z79891 Long term (current) use of opiate analgesic: Secondary | ICD-10-CM | POA: Diagnosis not present

## 2023-02-03 DIAGNOSIS — G894 Chronic pain syndrome: Secondary | ICD-10-CM | POA: Diagnosis not present

## 2023-02-03 DIAGNOSIS — G4733 Obstructive sleep apnea (adult) (pediatric): Secondary | ICD-10-CM | POA: Diagnosis not present

## 2023-02-03 DIAGNOSIS — G890 Central pain syndrome: Secondary | ICD-10-CM | POA: Diagnosis not present

## 2023-02-03 DIAGNOSIS — Z992 Dependence on renal dialysis: Secondary | ICD-10-CM | POA: Diagnosis not present

## 2023-02-03 DIAGNOSIS — I159 Secondary hypertension, unspecified: Secondary | ICD-10-CM | POA: Diagnosis not present

## 2023-02-03 DIAGNOSIS — M25519 Pain in unspecified shoulder: Secondary | ICD-10-CM | POA: Diagnosis not present

## 2023-02-04 DIAGNOSIS — D689 Coagulation defect, unspecified: Secondary | ICD-10-CM | POA: Diagnosis not present

## 2023-02-04 DIAGNOSIS — N186 End stage renal disease: Secondary | ICD-10-CM | POA: Diagnosis not present

## 2023-02-04 DIAGNOSIS — N2581 Secondary hyperparathyroidism of renal origin: Secondary | ICD-10-CM | POA: Diagnosis not present

## 2023-02-04 DIAGNOSIS — L299 Pruritus, unspecified: Secondary | ICD-10-CM | POA: Diagnosis not present

## 2023-02-04 DIAGNOSIS — D509 Iron deficiency anemia, unspecified: Secondary | ICD-10-CM | POA: Diagnosis not present

## 2023-02-04 DIAGNOSIS — Z992 Dependence on renal dialysis: Secondary | ICD-10-CM | POA: Diagnosis not present

## 2023-02-04 DIAGNOSIS — D631 Anemia in chronic kidney disease: Secondary | ICD-10-CM | POA: Diagnosis not present

## 2023-02-07 ENCOUNTER — Other Ambulatory Visit: Payer: Self-pay | Admitting: Student

## 2023-02-07 DIAGNOSIS — Z992 Dependence on renal dialysis: Secondary | ICD-10-CM | POA: Diagnosis not present

## 2023-02-07 DIAGNOSIS — N2581 Secondary hyperparathyroidism of renal origin: Secondary | ICD-10-CM | POA: Diagnosis not present

## 2023-02-07 DIAGNOSIS — L299 Pruritus, unspecified: Secondary | ICD-10-CM | POA: Diagnosis not present

## 2023-02-07 DIAGNOSIS — D509 Iron deficiency anemia, unspecified: Secondary | ICD-10-CM | POA: Diagnosis not present

## 2023-02-07 DIAGNOSIS — N186 End stage renal disease: Secondary | ICD-10-CM | POA: Diagnosis not present

## 2023-02-07 DIAGNOSIS — D631 Anemia in chronic kidney disease: Secondary | ICD-10-CM | POA: Diagnosis not present

## 2023-02-07 DIAGNOSIS — E038 Other specified hypothyroidism: Secondary | ICD-10-CM

## 2023-02-07 DIAGNOSIS — E1159 Type 2 diabetes mellitus with other circulatory complications: Secondary | ICD-10-CM

## 2023-02-07 DIAGNOSIS — D689 Coagulation defect, unspecified: Secondary | ICD-10-CM | POA: Diagnosis not present

## 2023-02-09 DIAGNOSIS — N2581 Secondary hyperparathyroidism of renal origin: Secondary | ICD-10-CM | POA: Diagnosis not present

## 2023-02-09 DIAGNOSIS — D509 Iron deficiency anemia, unspecified: Secondary | ICD-10-CM | POA: Diagnosis not present

## 2023-02-09 DIAGNOSIS — N186 End stage renal disease: Secondary | ICD-10-CM | POA: Diagnosis not present

## 2023-02-09 DIAGNOSIS — D631 Anemia in chronic kidney disease: Secondary | ICD-10-CM | POA: Diagnosis not present

## 2023-02-09 DIAGNOSIS — Z992 Dependence on renal dialysis: Secondary | ICD-10-CM | POA: Diagnosis not present

## 2023-02-09 DIAGNOSIS — L299 Pruritus, unspecified: Secondary | ICD-10-CM | POA: Diagnosis not present

## 2023-02-09 DIAGNOSIS — D689 Coagulation defect, unspecified: Secondary | ICD-10-CM | POA: Diagnosis not present

## 2023-02-11 DIAGNOSIS — D509 Iron deficiency anemia, unspecified: Secondary | ICD-10-CM | POA: Diagnosis not present

## 2023-02-11 DIAGNOSIS — D689 Coagulation defect, unspecified: Secondary | ICD-10-CM | POA: Diagnosis not present

## 2023-02-11 DIAGNOSIS — N186 End stage renal disease: Secondary | ICD-10-CM | POA: Diagnosis not present

## 2023-02-11 DIAGNOSIS — Z992 Dependence on renal dialysis: Secondary | ICD-10-CM | POA: Diagnosis not present

## 2023-02-11 DIAGNOSIS — N2581 Secondary hyperparathyroidism of renal origin: Secondary | ICD-10-CM | POA: Diagnosis not present

## 2023-02-11 DIAGNOSIS — L299 Pruritus, unspecified: Secondary | ICD-10-CM | POA: Diagnosis not present

## 2023-02-11 DIAGNOSIS — D631 Anemia in chronic kidney disease: Secondary | ICD-10-CM | POA: Diagnosis not present

## 2023-02-14 DIAGNOSIS — D631 Anemia in chronic kidney disease: Secondary | ICD-10-CM | POA: Diagnosis not present

## 2023-02-14 DIAGNOSIS — N186 End stage renal disease: Secondary | ICD-10-CM | POA: Diagnosis not present

## 2023-02-14 DIAGNOSIS — L299 Pruritus, unspecified: Secondary | ICD-10-CM | POA: Diagnosis not present

## 2023-02-14 DIAGNOSIS — N2581 Secondary hyperparathyroidism of renal origin: Secondary | ICD-10-CM | POA: Diagnosis not present

## 2023-02-14 DIAGNOSIS — D689 Coagulation defect, unspecified: Secondary | ICD-10-CM | POA: Diagnosis not present

## 2023-02-14 DIAGNOSIS — Z992 Dependence on renal dialysis: Secondary | ICD-10-CM | POA: Diagnosis not present

## 2023-02-14 DIAGNOSIS — D509 Iron deficiency anemia, unspecified: Secondary | ICD-10-CM | POA: Diagnosis not present

## 2023-02-16 DIAGNOSIS — Z992 Dependence on renal dialysis: Secondary | ICD-10-CM | POA: Diagnosis not present

## 2023-02-16 DIAGNOSIS — D631 Anemia in chronic kidney disease: Secondary | ICD-10-CM | POA: Diagnosis not present

## 2023-02-16 DIAGNOSIS — D689 Coagulation defect, unspecified: Secondary | ICD-10-CM | POA: Diagnosis not present

## 2023-02-16 DIAGNOSIS — N2581 Secondary hyperparathyroidism of renal origin: Secondary | ICD-10-CM | POA: Diagnosis not present

## 2023-02-16 DIAGNOSIS — D509 Iron deficiency anemia, unspecified: Secondary | ICD-10-CM | POA: Diagnosis not present

## 2023-02-16 DIAGNOSIS — L299 Pruritus, unspecified: Secondary | ICD-10-CM | POA: Diagnosis not present

## 2023-02-16 DIAGNOSIS — N186 End stage renal disease: Secondary | ICD-10-CM | POA: Diagnosis not present

## 2023-02-18 DIAGNOSIS — D689 Coagulation defect, unspecified: Secondary | ICD-10-CM | POA: Diagnosis not present

## 2023-02-18 DIAGNOSIS — D631 Anemia in chronic kidney disease: Secondary | ICD-10-CM | POA: Diagnosis not present

## 2023-02-18 DIAGNOSIS — N2581 Secondary hyperparathyroidism of renal origin: Secondary | ICD-10-CM | POA: Diagnosis not present

## 2023-02-18 DIAGNOSIS — Z992 Dependence on renal dialysis: Secondary | ICD-10-CM | POA: Diagnosis not present

## 2023-02-18 DIAGNOSIS — D509 Iron deficiency anemia, unspecified: Secondary | ICD-10-CM | POA: Diagnosis not present

## 2023-02-18 DIAGNOSIS — L299 Pruritus, unspecified: Secondary | ICD-10-CM | POA: Diagnosis not present

## 2023-02-18 DIAGNOSIS — N186 End stage renal disease: Secondary | ICD-10-CM | POA: Diagnosis not present

## 2023-02-20 DIAGNOSIS — Z992 Dependence on renal dialysis: Secondary | ICD-10-CM | POA: Diagnosis not present

## 2023-02-20 DIAGNOSIS — E1122 Type 2 diabetes mellitus with diabetic chronic kidney disease: Secondary | ICD-10-CM | POA: Diagnosis not present

## 2023-02-20 DIAGNOSIS — N186 End stage renal disease: Secondary | ICD-10-CM | POA: Diagnosis not present

## 2023-02-21 DIAGNOSIS — T7840XA Allergy, unspecified, initial encounter: Secondary | ICD-10-CM | POA: Diagnosis not present

## 2023-02-21 DIAGNOSIS — D509 Iron deficiency anemia, unspecified: Secondary | ICD-10-CM | POA: Diagnosis not present

## 2023-02-21 DIAGNOSIS — N186 End stage renal disease: Secondary | ICD-10-CM | POA: Diagnosis not present

## 2023-02-21 DIAGNOSIS — E1122 Type 2 diabetes mellitus with diabetic chronic kidney disease: Secondary | ICD-10-CM | POA: Diagnosis not present

## 2023-02-21 DIAGNOSIS — N2581 Secondary hyperparathyroidism of renal origin: Secondary | ICD-10-CM | POA: Diagnosis not present

## 2023-02-21 DIAGNOSIS — Z992 Dependence on renal dialysis: Secondary | ICD-10-CM | POA: Diagnosis not present

## 2023-02-21 DIAGNOSIS — D631 Anemia in chronic kidney disease: Secondary | ICD-10-CM | POA: Diagnosis not present

## 2023-02-21 DIAGNOSIS — D689 Coagulation defect, unspecified: Secondary | ICD-10-CM | POA: Diagnosis not present

## 2023-02-23 DIAGNOSIS — D689 Coagulation defect, unspecified: Secondary | ICD-10-CM | POA: Diagnosis not present

## 2023-02-23 DIAGNOSIS — Z992 Dependence on renal dialysis: Secondary | ICD-10-CM | POA: Diagnosis not present

## 2023-02-23 DIAGNOSIS — D631 Anemia in chronic kidney disease: Secondary | ICD-10-CM | POA: Diagnosis not present

## 2023-02-23 DIAGNOSIS — T7840XA Allergy, unspecified, initial encounter: Secondary | ICD-10-CM | POA: Diagnosis not present

## 2023-02-23 DIAGNOSIS — E1122 Type 2 diabetes mellitus with diabetic chronic kidney disease: Secondary | ICD-10-CM | POA: Diagnosis not present

## 2023-02-23 DIAGNOSIS — N2581 Secondary hyperparathyroidism of renal origin: Secondary | ICD-10-CM | POA: Diagnosis not present

## 2023-02-23 DIAGNOSIS — N186 End stage renal disease: Secondary | ICD-10-CM | POA: Diagnosis not present

## 2023-02-23 DIAGNOSIS — D509 Iron deficiency anemia, unspecified: Secondary | ICD-10-CM | POA: Diagnosis not present

## 2023-02-25 DIAGNOSIS — D631 Anemia in chronic kidney disease: Secondary | ICD-10-CM | POA: Diagnosis not present

## 2023-02-25 DIAGNOSIS — E1122 Type 2 diabetes mellitus with diabetic chronic kidney disease: Secondary | ICD-10-CM | POA: Diagnosis not present

## 2023-02-25 DIAGNOSIS — N2581 Secondary hyperparathyroidism of renal origin: Secondary | ICD-10-CM | POA: Diagnosis not present

## 2023-02-25 DIAGNOSIS — T7840XA Allergy, unspecified, initial encounter: Secondary | ICD-10-CM | POA: Diagnosis not present

## 2023-02-25 DIAGNOSIS — D509 Iron deficiency anemia, unspecified: Secondary | ICD-10-CM | POA: Diagnosis not present

## 2023-02-25 DIAGNOSIS — Z992 Dependence on renal dialysis: Secondary | ICD-10-CM | POA: Diagnosis not present

## 2023-02-25 DIAGNOSIS — N186 End stage renal disease: Secondary | ICD-10-CM | POA: Diagnosis not present

## 2023-02-25 DIAGNOSIS — D689 Coagulation defect, unspecified: Secondary | ICD-10-CM | POA: Diagnosis not present

## 2023-02-27 DIAGNOSIS — G890 Central pain syndrome: Secondary | ICD-10-CM | POA: Diagnosis not present

## 2023-02-27 DIAGNOSIS — G546 Phantom limb syndrome with pain: Secondary | ICD-10-CM | POA: Diagnosis not present

## 2023-02-27 DIAGNOSIS — M79643 Pain in unspecified hand: Secondary | ICD-10-CM | POA: Diagnosis not present

## 2023-02-27 DIAGNOSIS — M545 Low back pain, unspecified: Secondary | ICD-10-CM | POA: Diagnosis not present

## 2023-02-27 DIAGNOSIS — M25519 Pain in unspecified shoulder: Secondary | ICD-10-CM | POA: Diagnosis not present

## 2023-02-27 DIAGNOSIS — G4733 Obstructive sleep apnea (adult) (pediatric): Secondary | ICD-10-CM | POA: Diagnosis not present

## 2023-02-27 DIAGNOSIS — I159 Secondary hypertension, unspecified: Secondary | ICD-10-CM | POA: Diagnosis not present

## 2023-02-27 DIAGNOSIS — N186 End stage renal disease: Secondary | ICD-10-CM | POA: Diagnosis not present

## 2023-02-27 DIAGNOSIS — M25562 Pain in left knee: Secondary | ICD-10-CM | POA: Diagnosis not present

## 2023-02-28 DIAGNOSIS — E1122 Type 2 diabetes mellitus with diabetic chronic kidney disease: Secondary | ICD-10-CM | POA: Diagnosis not present

## 2023-02-28 DIAGNOSIS — Z992 Dependence on renal dialysis: Secondary | ICD-10-CM | POA: Diagnosis not present

## 2023-02-28 DIAGNOSIS — N186 End stage renal disease: Secondary | ICD-10-CM | POA: Diagnosis not present

## 2023-02-28 DIAGNOSIS — D509 Iron deficiency anemia, unspecified: Secondary | ICD-10-CM | POA: Diagnosis not present

## 2023-02-28 DIAGNOSIS — N2581 Secondary hyperparathyroidism of renal origin: Secondary | ICD-10-CM | POA: Diagnosis not present

## 2023-02-28 DIAGNOSIS — D689 Coagulation defect, unspecified: Secondary | ICD-10-CM | POA: Diagnosis not present

## 2023-02-28 DIAGNOSIS — T7840XA Allergy, unspecified, initial encounter: Secondary | ICD-10-CM | POA: Diagnosis not present

## 2023-02-28 DIAGNOSIS — D631 Anemia in chronic kidney disease: Secondary | ICD-10-CM | POA: Diagnosis not present

## 2023-03-02 DIAGNOSIS — T7840XA Allergy, unspecified, initial encounter: Secondary | ICD-10-CM | POA: Diagnosis not present

## 2023-03-02 DIAGNOSIS — D631 Anemia in chronic kidney disease: Secondary | ICD-10-CM | POA: Diagnosis not present

## 2023-03-02 DIAGNOSIS — N186 End stage renal disease: Secondary | ICD-10-CM | POA: Diagnosis not present

## 2023-03-02 DIAGNOSIS — E1122 Type 2 diabetes mellitus with diabetic chronic kidney disease: Secondary | ICD-10-CM | POA: Diagnosis not present

## 2023-03-02 DIAGNOSIS — D689 Coagulation defect, unspecified: Secondary | ICD-10-CM | POA: Diagnosis not present

## 2023-03-02 DIAGNOSIS — Z992 Dependence on renal dialysis: Secondary | ICD-10-CM | POA: Diagnosis not present

## 2023-03-02 DIAGNOSIS — D509 Iron deficiency anemia, unspecified: Secondary | ICD-10-CM | POA: Diagnosis not present

## 2023-03-02 DIAGNOSIS — N2581 Secondary hyperparathyroidism of renal origin: Secondary | ICD-10-CM | POA: Diagnosis not present

## 2023-03-04 DIAGNOSIS — T7840XA Allergy, unspecified, initial encounter: Secondary | ICD-10-CM | POA: Diagnosis not present

## 2023-03-04 DIAGNOSIS — D631 Anemia in chronic kidney disease: Secondary | ICD-10-CM | POA: Diagnosis not present

## 2023-03-04 DIAGNOSIS — E1122 Type 2 diabetes mellitus with diabetic chronic kidney disease: Secondary | ICD-10-CM | POA: Diagnosis not present

## 2023-03-04 DIAGNOSIS — D509 Iron deficiency anemia, unspecified: Secondary | ICD-10-CM | POA: Diagnosis not present

## 2023-03-04 DIAGNOSIS — D689 Coagulation defect, unspecified: Secondary | ICD-10-CM | POA: Diagnosis not present

## 2023-03-04 DIAGNOSIS — N186 End stage renal disease: Secondary | ICD-10-CM | POA: Diagnosis not present

## 2023-03-04 DIAGNOSIS — Z992 Dependence on renal dialysis: Secondary | ICD-10-CM | POA: Diagnosis not present

## 2023-03-04 DIAGNOSIS — N2581 Secondary hyperparathyroidism of renal origin: Secondary | ICD-10-CM | POA: Diagnosis not present

## 2023-03-07 DIAGNOSIS — N186 End stage renal disease: Secondary | ICD-10-CM | POA: Diagnosis not present

## 2023-03-07 DIAGNOSIS — D509 Iron deficiency anemia, unspecified: Secondary | ICD-10-CM | POA: Diagnosis not present

## 2023-03-07 DIAGNOSIS — Z992 Dependence on renal dialysis: Secondary | ICD-10-CM | POA: Diagnosis not present

## 2023-03-07 DIAGNOSIS — N2581 Secondary hyperparathyroidism of renal origin: Secondary | ICD-10-CM | POA: Diagnosis not present

## 2023-03-07 DIAGNOSIS — D631 Anemia in chronic kidney disease: Secondary | ICD-10-CM | POA: Diagnosis not present

## 2023-03-07 DIAGNOSIS — E1122 Type 2 diabetes mellitus with diabetic chronic kidney disease: Secondary | ICD-10-CM | POA: Diagnosis not present

## 2023-03-07 DIAGNOSIS — T7840XA Allergy, unspecified, initial encounter: Secondary | ICD-10-CM | POA: Diagnosis not present

## 2023-03-07 DIAGNOSIS — D689 Coagulation defect, unspecified: Secondary | ICD-10-CM | POA: Diagnosis not present

## 2023-03-08 ENCOUNTER — Encounter: Payer: 59 | Admitting: Student

## 2023-03-09 DIAGNOSIS — E1122 Type 2 diabetes mellitus with diabetic chronic kidney disease: Secondary | ICD-10-CM | POA: Diagnosis not present

## 2023-03-09 DIAGNOSIS — Z992 Dependence on renal dialysis: Secondary | ICD-10-CM | POA: Diagnosis not present

## 2023-03-09 DIAGNOSIS — T7840XA Allergy, unspecified, initial encounter: Secondary | ICD-10-CM | POA: Diagnosis not present

## 2023-03-09 DIAGNOSIS — D631 Anemia in chronic kidney disease: Secondary | ICD-10-CM | POA: Diagnosis not present

## 2023-03-09 DIAGNOSIS — N186 End stage renal disease: Secondary | ICD-10-CM | POA: Diagnosis not present

## 2023-03-09 DIAGNOSIS — D689 Coagulation defect, unspecified: Secondary | ICD-10-CM | POA: Diagnosis not present

## 2023-03-09 DIAGNOSIS — N2581 Secondary hyperparathyroidism of renal origin: Secondary | ICD-10-CM | POA: Diagnosis not present

## 2023-03-09 DIAGNOSIS — D509 Iron deficiency anemia, unspecified: Secondary | ICD-10-CM | POA: Diagnosis not present

## 2023-03-11 DIAGNOSIS — N2581 Secondary hyperparathyroidism of renal origin: Secondary | ICD-10-CM | POA: Diagnosis not present

## 2023-03-11 DIAGNOSIS — D689 Coagulation defect, unspecified: Secondary | ICD-10-CM | POA: Diagnosis not present

## 2023-03-11 DIAGNOSIS — T7840XA Allergy, unspecified, initial encounter: Secondary | ICD-10-CM | POA: Diagnosis not present

## 2023-03-11 DIAGNOSIS — D631 Anemia in chronic kidney disease: Secondary | ICD-10-CM | POA: Diagnosis not present

## 2023-03-11 DIAGNOSIS — Z992 Dependence on renal dialysis: Secondary | ICD-10-CM | POA: Diagnosis not present

## 2023-03-11 DIAGNOSIS — N186 End stage renal disease: Secondary | ICD-10-CM | POA: Diagnosis not present

## 2023-03-11 DIAGNOSIS — D509 Iron deficiency anemia, unspecified: Secondary | ICD-10-CM | POA: Diagnosis not present

## 2023-03-11 DIAGNOSIS — E1122 Type 2 diabetes mellitus with diabetic chronic kidney disease: Secondary | ICD-10-CM | POA: Diagnosis not present

## 2023-03-14 DIAGNOSIS — E1122 Type 2 diabetes mellitus with diabetic chronic kidney disease: Secondary | ICD-10-CM | POA: Diagnosis not present

## 2023-03-14 DIAGNOSIS — D631 Anemia in chronic kidney disease: Secondary | ICD-10-CM | POA: Diagnosis not present

## 2023-03-14 DIAGNOSIS — Z992 Dependence on renal dialysis: Secondary | ICD-10-CM | POA: Diagnosis not present

## 2023-03-14 DIAGNOSIS — N186 End stage renal disease: Secondary | ICD-10-CM | POA: Diagnosis not present

## 2023-03-14 DIAGNOSIS — D689 Coagulation defect, unspecified: Secondary | ICD-10-CM | POA: Diagnosis not present

## 2023-03-14 DIAGNOSIS — D509 Iron deficiency anemia, unspecified: Secondary | ICD-10-CM | POA: Diagnosis not present

## 2023-03-14 DIAGNOSIS — T7840XA Allergy, unspecified, initial encounter: Secondary | ICD-10-CM | POA: Diagnosis not present

## 2023-03-14 DIAGNOSIS — N2581 Secondary hyperparathyroidism of renal origin: Secondary | ICD-10-CM | POA: Diagnosis not present

## 2023-03-16 DIAGNOSIS — T7840XA Allergy, unspecified, initial encounter: Secondary | ICD-10-CM | POA: Diagnosis not present

## 2023-03-16 DIAGNOSIS — D631 Anemia in chronic kidney disease: Secondary | ICD-10-CM | POA: Diagnosis not present

## 2023-03-16 DIAGNOSIS — N186 End stage renal disease: Secondary | ICD-10-CM | POA: Diagnosis not present

## 2023-03-16 DIAGNOSIS — Z992 Dependence on renal dialysis: Secondary | ICD-10-CM | POA: Diagnosis not present

## 2023-03-16 DIAGNOSIS — D509 Iron deficiency anemia, unspecified: Secondary | ICD-10-CM | POA: Diagnosis not present

## 2023-03-16 DIAGNOSIS — E1122 Type 2 diabetes mellitus with diabetic chronic kidney disease: Secondary | ICD-10-CM | POA: Diagnosis not present

## 2023-03-16 DIAGNOSIS — D689 Coagulation defect, unspecified: Secondary | ICD-10-CM | POA: Diagnosis not present

## 2023-03-16 DIAGNOSIS — N2581 Secondary hyperparathyroidism of renal origin: Secondary | ICD-10-CM | POA: Diagnosis not present

## 2023-03-18 DIAGNOSIS — N2581 Secondary hyperparathyroidism of renal origin: Secondary | ICD-10-CM | POA: Diagnosis not present

## 2023-03-18 DIAGNOSIS — Z992 Dependence on renal dialysis: Secondary | ICD-10-CM | POA: Diagnosis not present

## 2023-03-18 DIAGNOSIS — E1122 Type 2 diabetes mellitus with diabetic chronic kidney disease: Secondary | ICD-10-CM | POA: Diagnosis not present

## 2023-03-18 DIAGNOSIS — T7840XA Allergy, unspecified, initial encounter: Secondary | ICD-10-CM | POA: Diagnosis not present

## 2023-03-18 DIAGNOSIS — D689 Coagulation defect, unspecified: Secondary | ICD-10-CM | POA: Diagnosis not present

## 2023-03-18 DIAGNOSIS — D509 Iron deficiency anemia, unspecified: Secondary | ICD-10-CM | POA: Diagnosis not present

## 2023-03-18 DIAGNOSIS — D631 Anemia in chronic kidney disease: Secondary | ICD-10-CM | POA: Diagnosis not present

## 2023-03-18 DIAGNOSIS — N186 End stage renal disease: Secondary | ICD-10-CM | POA: Diagnosis not present

## 2023-03-21 DIAGNOSIS — D689 Coagulation defect, unspecified: Secondary | ICD-10-CM | POA: Diagnosis not present

## 2023-03-21 DIAGNOSIS — T7840XA Allergy, unspecified, initial encounter: Secondary | ICD-10-CM | POA: Diagnosis not present

## 2023-03-21 DIAGNOSIS — Z992 Dependence on renal dialysis: Secondary | ICD-10-CM | POA: Diagnosis not present

## 2023-03-21 DIAGNOSIS — D509 Iron deficiency anemia, unspecified: Secondary | ICD-10-CM | POA: Diagnosis not present

## 2023-03-21 DIAGNOSIS — E1122 Type 2 diabetes mellitus with diabetic chronic kidney disease: Secondary | ICD-10-CM | POA: Diagnosis not present

## 2023-03-21 DIAGNOSIS — N186 End stage renal disease: Secondary | ICD-10-CM | POA: Diagnosis not present

## 2023-03-21 DIAGNOSIS — D631 Anemia in chronic kidney disease: Secondary | ICD-10-CM | POA: Diagnosis not present

## 2023-03-21 DIAGNOSIS — N2581 Secondary hyperparathyroidism of renal origin: Secondary | ICD-10-CM | POA: Diagnosis not present

## 2023-03-23 DIAGNOSIS — D631 Anemia in chronic kidney disease: Secondary | ICD-10-CM | POA: Diagnosis not present

## 2023-03-23 DIAGNOSIS — N186 End stage renal disease: Secondary | ICD-10-CM | POA: Diagnosis not present

## 2023-03-23 DIAGNOSIS — D509 Iron deficiency anemia, unspecified: Secondary | ICD-10-CM | POA: Diagnosis not present

## 2023-03-23 DIAGNOSIS — T7840XA Allergy, unspecified, initial encounter: Secondary | ICD-10-CM | POA: Diagnosis not present

## 2023-03-23 DIAGNOSIS — D689 Coagulation defect, unspecified: Secondary | ICD-10-CM | POA: Diagnosis not present

## 2023-03-23 DIAGNOSIS — Z992 Dependence on renal dialysis: Secondary | ICD-10-CM | POA: Diagnosis not present

## 2023-03-23 DIAGNOSIS — E1122 Type 2 diabetes mellitus with diabetic chronic kidney disease: Secondary | ICD-10-CM | POA: Diagnosis not present

## 2023-03-23 DIAGNOSIS — N2581 Secondary hyperparathyroidism of renal origin: Secondary | ICD-10-CM | POA: Diagnosis not present

## 2023-03-25 DIAGNOSIS — Z992 Dependence on renal dialysis: Secondary | ICD-10-CM | POA: Diagnosis not present

## 2023-03-25 DIAGNOSIS — N186 End stage renal disease: Secondary | ICD-10-CM | POA: Diagnosis not present

## 2023-03-25 DIAGNOSIS — D689 Coagulation defect, unspecified: Secondary | ICD-10-CM | POA: Diagnosis not present

## 2023-03-25 DIAGNOSIS — D509 Iron deficiency anemia, unspecified: Secondary | ICD-10-CM | POA: Diagnosis not present

## 2023-03-25 DIAGNOSIS — N2581 Secondary hyperparathyroidism of renal origin: Secondary | ICD-10-CM | POA: Diagnosis not present

## 2023-03-25 DIAGNOSIS — D631 Anemia in chronic kidney disease: Secondary | ICD-10-CM | POA: Diagnosis not present

## 2023-03-28 DIAGNOSIS — D689 Coagulation defect, unspecified: Secondary | ICD-10-CM | POA: Diagnosis not present

## 2023-03-28 DIAGNOSIS — Z992 Dependence on renal dialysis: Secondary | ICD-10-CM | POA: Diagnosis not present

## 2023-03-28 DIAGNOSIS — D509 Iron deficiency anemia, unspecified: Secondary | ICD-10-CM | POA: Diagnosis not present

## 2023-03-28 DIAGNOSIS — N186 End stage renal disease: Secondary | ICD-10-CM | POA: Diagnosis not present

## 2023-03-28 DIAGNOSIS — D631 Anemia in chronic kidney disease: Secondary | ICD-10-CM | POA: Diagnosis not present

## 2023-03-28 DIAGNOSIS — N2581 Secondary hyperparathyroidism of renal origin: Secondary | ICD-10-CM | POA: Diagnosis not present

## 2023-03-30 DIAGNOSIS — N186 End stage renal disease: Secondary | ICD-10-CM | POA: Diagnosis not present

## 2023-03-30 DIAGNOSIS — D509 Iron deficiency anemia, unspecified: Secondary | ICD-10-CM | POA: Diagnosis not present

## 2023-03-30 DIAGNOSIS — D689 Coagulation defect, unspecified: Secondary | ICD-10-CM | POA: Diagnosis not present

## 2023-03-30 DIAGNOSIS — D631 Anemia in chronic kidney disease: Secondary | ICD-10-CM | POA: Diagnosis not present

## 2023-03-30 DIAGNOSIS — N2581 Secondary hyperparathyroidism of renal origin: Secondary | ICD-10-CM | POA: Diagnosis not present

## 2023-03-30 DIAGNOSIS — Z992 Dependence on renal dialysis: Secondary | ICD-10-CM | POA: Diagnosis not present

## 2023-04-01 DIAGNOSIS — Z992 Dependence on renal dialysis: Secondary | ICD-10-CM | POA: Diagnosis not present

## 2023-04-01 DIAGNOSIS — D631 Anemia in chronic kidney disease: Secondary | ICD-10-CM | POA: Diagnosis not present

## 2023-04-01 DIAGNOSIS — N186 End stage renal disease: Secondary | ICD-10-CM | POA: Diagnosis not present

## 2023-04-01 DIAGNOSIS — D689 Coagulation defect, unspecified: Secondary | ICD-10-CM | POA: Diagnosis not present

## 2023-04-01 DIAGNOSIS — D509 Iron deficiency anemia, unspecified: Secondary | ICD-10-CM | POA: Diagnosis not present

## 2023-04-01 DIAGNOSIS — N2581 Secondary hyperparathyroidism of renal origin: Secondary | ICD-10-CM | POA: Diagnosis not present

## 2023-04-04 DIAGNOSIS — D631 Anemia in chronic kidney disease: Secondary | ICD-10-CM | POA: Diagnosis not present

## 2023-04-04 DIAGNOSIS — N186 End stage renal disease: Secondary | ICD-10-CM | POA: Diagnosis not present

## 2023-04-04 DIAGNOSIS — Z992 Dependence on renal dialysis: Secondary | ICD-10-CM | POA: Diagnosis not present

## 2023-04-04 DIAGNOSIS — D689 Coagulation defect, unspecified: Secondary | ICD-10-CM | POA: Diagnosis not present

## 2023-04-04 DIAGNOSIS — D509 Iron deficiency anemia, unspecified: Secondary | ICD-10-CM | POA: Diagnosis not present

## 2023-04-04 DIAGNOSIS — N2581 Secondary hyperparathyroidism of renal origin: Secondary | ICD-10-CM | POA: Diagnosis not present

## 2023-04-06 DIAGNOSIS — Z992 Dependence on renal dialysis: Secondary | ICD-10-CM | POA: Diagnosis not present

## 2023-04-06 DIAGNOSIS — N2581 Secondary hyperparathyroidism of renal origin: Secondary | ICD-10-CM | POA: Diagnosis not present

## 2023-04-06 DIAGNOSIS — D631 Anemia in chronic kidney disease: Secondary | ICD-10-CM | POA: Diagnosis not present

## 2023-04-06 DIAGNOSIS — D689 Coagulation defect, unspecified: Secondary | ICD-10-CM | POA: Diagnosis not present

## 2023-04-06 DIAGNOSIS — D509 Iron deficiency anemia, unspecified: Secondary | ICD-10-CM | POA: Diagnosis not present

## 2023-04-06 DIAGNOSIS — N186 End stage renal disease: Secondary | ICD-10-CM | POA: Diagnosis not present

## 2023-04-07 DIAGNOSIS — G894 Chronic pain syndrome: Secondary | ICD-10-CM | POA: Diagnosis not present

## 2023-04-07 DIAGNOSIS — Z79891 Long term (current) use of opiate analgesic: Secondary | ICD-10-CM | POA: Diagnosis not present

## 2023-04-08 DIAGNOSIS — D689 Coagulation defect, unspecified: Secondary | ICD-10-CM | POA: Diagnosis not present

## 2023-04-08 DIAGNOSIS — N2581 Secondary hyperparathyroidism of renal origin: Secondary | ICD-10-CM | POA: Diagnosis not present

## 2023-04-08 DIAGNOSIS — Z992 Dependence on renal dialysis: Secondary | ICD-10-CM | POA: Diagnosis not present

## 2023-04-08 DIAGNOSIS — N186 End stage renal disease: Secondary | ICD-10-CM | POA: Diagnosis not present

## 2023-04-08 DIAGNOSIS — D509 Iron deficiency anemia, unspecified: Secondary | ICD-10-CM | POA: Diagnosis not present

## 2023-04-08 DIAGNOSIS — D631 Anemia in chronic kidney disease: Secondary | ICD-10-CM | POA: Diagnosis not present

## 2023-04-10 ENCOUNTER — Other Ambulatory Visit: Payer: Self-pay | Admitting: Student

## 2023-04-10 DIAGNOSIS — E1159 Type 2 diabetes mellitus with other circulatory complications: Secondary | ICD-10-CM

## 2023-04-11 DIAGNOSIS — D631 Anemia in chronic kidney disease: Secondary | ICD-10-CM | POA: Diagnosis not present

## 2023-04-11 DIAGNOSIS — D689 Coagulation defect, unspecified: Secondary | ICD-10-CM | POA: Diagnosis not present

## 2023-04-11 DIAGNOSIS — Z992 Dependence on renal dialysis: Secondary | ICD-10-CM | POA: Diagnosis not present

## 2023-04-11 DIAGNOSIS — N186 End stage renal disease: Secondary | ICD-10-CM | POA: Diagnosis not present

## 2023-04-11 DIAGNOSIS — N2581 Secondary hyperparathyroidism of renal origin: Secondary | ICD-10-CM | POA: Diagnosis not present

## 2023-04-11 DIAGNOSIS — D509 Iron deficiency anemia, unspecified: Secondary | ICD-10-CM | POA: Diagnosis not present

## 2023-04-13 DIAGNOSIS — Z992 Dependence on renal dialysis: Secondary | ICD-10-CM | POA: Diagnosis not present

## 2023-04-13 DIAGNOSIS — N2581 Secondary hyperparathyroidism of renal origin: Secondary | ICD-10-CM | POA: Diagnosis not present

## 2023-04-13 DIAGNOSIS — D631 Anemia in chronic kidney disease: Secondary | ICD-10-CM | POA: Diagnosis not present

## 2023-04-13 DIAGNOSIS — D689 Coagulation defect, unspecified: Secondary | ICD-10-CM | POA: Diagnosis not present

## 2023-04-13 DIAGNOSIS — D509 Iron deficiency anemia, unspecified: Secondary | ICD-10-CM | POA: Diagnosis not present

## 2023-04-13 DIAGNOSIS — N186 End stage renal disease: Secondary | ICD-10-CM | POA: Diagnosis not present

## 2023-04-15 DIAGNOSIS — N2581 Secondary hyperparathyroidism of renal origin: Secondary | ICD-10-CM | POA: Diagnosis not present

## 2023-04-15 DIAGNOSIS — D689 Coagulation defect, unspecified: Secondary | ICD-10-CM | POA: Diagnosis not present

## 2023-04-15 DIAGNOSIS — Z992 Dependence on renal dialysis: Secondary | ICD-10-CM | POA: Diagnosis not present

## 2023-04-15 DIAGNOSIS — D631 Anemia in chronic kidney disease: Secondary | ICD-10-CM | POA: Diagnosis not present

## 2023-04-15 DIAGNOSIS — N186 End stage renal disease: Secondary | ICD-10-CM | POA: Diagnosis not present

## 2023-04-15 DIAGNOSIS — D509 Iron deficiency anemia, unspecified: Secondary | ICD-10-CM | POA: Diagnosis not present

## 2023-04-17 DIAGNOSIS — D689 Coagulation defect, unspecified: Secondary | ICD-10-CM | POA: Diagnosis not present

## 2023-04-17 DIAGNOSIS — D631 Anemia in chronic kidney disease: Secondary | ICD-10-CM | POA: Diagnosis not present

## 2023-04-17 DIAGNOSIS — Z992 Dependence on renal dialysis: Secondary | ICD-10-CM | POA: Diagnosis not present

## 2023-04-17 DIAGNOSIS — N186 End stage renal disease: Secondary | ICD-10-CM | POA: Diagnosis not present

## 2023-04-17 DIAGNOSIS — N2581 Secondary hyperparathyroidism of renal origin: Secondary | ICD-10-CM | POA: Diagnosis not present

## 2023-04-17 DIAGNOSIS — D509 Iron deficiency anemia, unspecified: Secondary | ICD-10-CM | POA: Diagnosis not present

## 2023-04-19 DIAGNOSIS — Z992 Dependence on renal dialysis: Secondary | ICD-10-CM | POA: Diagnosis not present

## 2023-04-19 DIAGNOSIS — D509 Iron deficiency anemia, unspecified: Secondary | ICD-10-CM | POA: Diagnosis not present

## 2023-04-19 DIAGNOSIS — D631 Anemia in chronic kidney disease: Secondary | ICD-10-CM | POA: Diagnosis not present

## 2023-04-19 DIAGNOSIS — N2581 Secondary hyperparathyroidism of renal origin: Secondary | ICD-10-CM | POA: Diagnosis not present

## 2023-04-19 DIAGNOSIS — D689 Coagulation defect, unspecified: Secondary | ICD-10-CM | POA: Diagnosis not present

## 2023-04-19 DIAGNOSIS — N186 End stage renal disease: Secondary | ICD-10-CM | POA: Diagnosis not present

## 2023-04-22 DIAGNOSIS — D509 Iron deficiency anemia, unspecified: Secondary | ICD-10-CM | POA: Diagnosis not present

## 2023-04-22 DIAGNOSIS — D631 Anemia in chronic kidney disease: Secondary | ICD-10-CM | POA: Diagnosis not present

## 2023-04-22 DIAGNOSIS — Z992 Dependence on renal dialysis: Secondary | ICD-10-CM | POA: Diagnosis not present

## 2023-04-22 DIAGNOSIS — D689 Coagulation defect, unspecified: Secondary | ICD-10-CM | POA: Diagnosis not present

## 2023-04-22 DIAGNOSIS — N186 End stage renal disease: Secondary | ICD-10-CM | POA: Diagnosis not present

## 2023-04-22 DIAGNOSIS — N2581 Secondary hyperparathyroidism of renal origin: Secondary | ICD-10-CM | POA: Diagnosis not present

## 2023-04-22 DIAGNOSIS — E1122 Type 2 diabetes mellitus with diabetic chronic kidney disease: Secondary | ICD-10-CM | POA: Diagnosis not present

## 2023-04-25 DIAGNOSIS — R52 Pain, unspecified: Secondary | ICD-10-CM | POA: Diagnosis not present

## 2023-04-25 DIAGNOSIS — D509 Iron deficiency anemia, unspecified: Secondary | ICD-10-CM | POA: Diagnosis not present

## 2023-04-25 DIAGNOSIS — N186 End stage renal disease: Secondary | ICD-10-CM | POA: Diagnosis not present

## 2023-04-25 DIAGNOSIS — D689 Coagulation defect, unspecified: Secondary | ICD-10-CM | POA: Diagnosis not present

## 2023-04-25 DIAGNOSIS — D631 Anemia in chronic kidney disease: Secondary | ICD-10-CM | POA: Diagnosis not present

## 2023-04-25 DIAGNOSIS — L299 Pruritus, unspecified: Secondary | ICD-10-CM | POA: Diagnosis not present

## 2023-04-25 DIAGNOSIS — Z992 Dependence on renal dialysis: Secondary | ICD-10-CM | POA: Diagnosis not present

## 2023-04-25 DIAGNOSIS — N2581 Secondary hyperparathyroidism of renal origin: Secondary | ICD-10-CM | POA: Diagnosis not present

## 2023-04-27 DIAGNOSIS — N2581 Secondary hyperparathyroidism of renal origin: Secondary | ICD-10-CM | POA: Diagnosis not present

## 2023-04-27 DIAGNOSIS — D689 Coagulation defect, unspecified: Secondary | ICD-10-CM | POA: Diagnosis not present

## 2023-04-27 DIAGNOSIS — D509 Iron deficiency anemia, unspecified: Secondary | ICD-10-CM | POA: Diagnosis not present

## 2023-04-27 DIAGNOSIS — L299 Pruritus, unspecified: Secondary | ICD-10-CM | POA: Diagnosis not present

## 2023-04-27 DIAGNOSIS — R52 Pain, unspecified: Secondary | ICD-10-CM | POA: Diagnosis not present

## 2023-04-27 DIAGNOSIS — Z992 Dependence on renal dialysis: Secondary | ICD-10-CM | POA: Diagnosis not present

## 2023-04-27 DIAGNOSIS — D631 Anemia in chronic kidney disease: Secondary | ICD-10-CM | POA: Diagnosis not present

## 2023-04-27 DIAGNOSIS — N186 End stage renal disease: Secondary | ICD-10-CM | POA: Diagnosis not present

## 2023-04-29 DIAGNOSIS — N186 End stage renal disease: Secondary | ICD-10-CM | POA: Diagnosis not present

## 2023-04-29 DIAGNOSIS — D689 Coagulation defect, unspecified: Secondary | ICD-10-CM | POA: Diagnosis not present

## 2023-04-29 DIAGNOSIS — Z992 Dependence on renal dialysis: Secondary | ICD-10-CM | POA: Diagnosis not present

## 2023-04-29 DIAGNOSIS — R52 Pain, unspecified: Secondary | ICD-10-CM | POA: Diagnosis not present

## 2023-04-29 DIAGNOSIS — L299 Pruritus, unspecified: Secondary | ICD-10-CM | POA: Diagnosis not present

## 2023-04-29 DIAGNOSIS — N2581 Secondary hyperparathyroidism of renal origin: Secondary | ICD-10-CM | POA: Diagnosis not present

## 2023-04-29 DIAGNOSIS — D509 Iron deficiency anemia, unspecified: Secondary | ICD-10-CM | POA: Diagnosis not present

## 2023-04-29 DIAGNOSIS — D631 Anemia in chronic kidney disease: Secondary | ICD-10-CM | POA: Diagnosis not present

## 2023-05-02 DIAGNOSIS — D509 Iron deficiency anemia, unspecified: Secondary | ICD-10-CM | POA: Diagnosis not present

## 2023-05-02 DIAGNOSIS — D631 Anemia in chronic kidney disease: Secondary | ICD-10-CM | POA: Diagnosis not present

## 2023-05-02 DIAGNOSIS — D689 Coagulation defect, unspecified: Secondary | ICD-10-CM | POA: Diagnosis not present

## 2023-05-02 DIAGNOSIS — N186 End stage renal disease: Secondary | ICD-10-CM | POA: Diagnosis not present

## 2023-05-02 DIAGNOSIS — N2581 Secondary hyperparathyroidism of renal origin: Secondary | ICD-10-CM | POA: Diagnosis not present

## 2023-05-02 DIAGNOSIS — R52 Pain, unspecified: Secondary | ICD-10-CM | POA: Diagnosis not present

## 2023-05-02 DIAGNOSIS — L299 Pruritus, unspecified: Secondary | ICD-10-CM | POA: Diagnosis not present

## 2023-05-02 DIAGNOSIS — Z992 Dependence on renal dialysis: Secondary | ICD-10-CM | POA: Diagnosis not present

## 2023-05-03 ENCOUNTER — Telehealth: Payer: Self-pay

## 2023-05-03 NOTE — Telephone Encounter (Signed)
Patient called she is requesting a order for a socket to [facility], patient stated she has lost weight and the one she currently has is too big. Please return patients call.

## 2023-05-04 DIAGNOSIS — N186 End stage renal disease: Secondary | ICD-10-CM | POA: Diagnosis not present

## 2023-05-04 DIAGNOSIS — L299 Pruritus, unspecified: Secondary | ICD-10-CM | POA: Diagnosis not present

## 2023-05-04 DIAGNOSIS — D689 Coagulation defect, unspecified: Secondary | ICD-10-CM | POA: Diagnosis not present

## 2023-05-04 DIAGNOSIS — Z992 Dependence on renal dialysis: Secondary | ICD-10-CM | POA: Diagnosis not present

## 2023-05-04 DIAGNOSIS — D509 Iron deficiency anemia, unspecified: Secondary | ICD-10-CM | POA: Diagnosis not present

## 2023-05-04 DIAGNOSIS — G894 Chronic pain syndrome: Secondary | ICD-10-CM | POA: Diagnosis not present

## 2023-05-04 DIAGNOSIS — Z79891 Long term (current) use of opiate analgesic: Secondary | ICD-10-CM | POA: Diagnosis not present

## 2023-05-04 DIAGNOSIS — N2581 Secondary hyperparathyroidism of renal origin: Secondary | ICD-10-CM | POA: Diagnosis not present

## 2023-05-04 DIAGNOSIS — D631 Anemia in chronic kidney disease: Secondary | ICD-10-CM | POA: Diagnosis not present

## 2023-05-04 DIAGNOSIS — R52 Pain, unspecified: Secondary | ICD-10-CM | POA: Diagnosis not present

## 2023-05-05 DIAGNOSIS — M25562 Pain in left knee: Secondary | ICD-10-CM | POA: Diagnosis not present

## 2023-05-05 DIAGNOSIS — G546 Phantom limb syndrome with pain: Secondary | ICD-10-CM | POA: Diagnosis not present

## 2023-05-05 DIAGNOSIS — Z992 Dependence on renal dialysis: Secondary | ICD-10-CM | POA: Diagnosis not present

## 2023-05-05 DIAGNOSIS — M79643 Pain in unspecified hand: Secondary | ICD-10-CM | POA: Diagnosis not present

## 2023-05-06 DIAGNOSIS — R52 Pain, unspecified: Secondary | ICD-10-CM | POA: Diagnosis not present

## 2023-05-06 DIAGNOSIS — L299 Pruritus, unspecified: Secondary | ICD-10-CM | POA: Diagnosis not present

## 2023-05-06 DIAGNOSIS — Z992 Dependence on renal dialysis: Secondary | ICD-10-CM | POA: Diagnosis not present

## 2023-05-06 DIAGNOSIS — D631 Anemia in chronic kidney disease: Secondary | ICD-10-CM | POA: Diagnosis not present

## 2023-05-06 DIAGNOSIS — D689 Coagulation defect, unspecified: Secondary | ICD-10-CM | POA: Diagnosis not present

## 2023-05-06 DIAGNOSIS — D509 Iron deficiency anemia, unspecified: Secondary | ICD-10-CM | POA: Diagnosis not present

## 2023-05-06 DIAGNOSIS — N2581 Secondary hyperparathyroidism of renal origin: Secondary | ICD-10-CM | POA: Diagnosis not present

## 2023-05-06 DIAGNOSIS — N186 End stage renal disease: Secondary | ICD-10-CM | POA: Diagnosis not present

## 2023-05-08 ENCOUNTER — Other Ambulatory Visit: Payer: Self-pay | Admitting: Student

## 2023-05-08 DIAGNOSIS — I152 Hypertension secondary to endocrine disorders: Secondary | ICD-10-CM

## 2023-05-08 NOTE — Telephone Encounter (Signed)
Next appt scheduled 12/18 with Dr Welton Flakes.

## 2023-05-09 DIAGNOSIS — N186 End stage renal disease: Secondary | ICD-10-CM | POA: Diagnosis not present

## 2023-05-09 DIAGNOSIS — D509 Iron deficiency anemia, unspecified: Secondary | ICD-10-CM | POA: Diagnosis not present

## 2023-05-09 DIAGNOSIS — R52 Pain, unspecified: Secondary | ICD-10-CM | POA: Diagnosis not present

## 2023-05-09 DIAGNOSIS — Z992 Dependence on renal dialysis: Secondary | ICD-10-CM | POA: Diagnosis not present

## 2023-05-09 DIAGNOSIS — L299 Pruritus, unspecified: Secondary | ICD-10-CM | POA: Diagnosis not present

## 2023-05-09 DIAGNOSIS — D689 Coagulation defect, unspecified: Secondary | ICD-10-CM | POA: Diagnosis not present

## 2023-05-09 DIAGNOSIS — N2581 Secondary hyperparathyroidism of renal origin: Secondary | ICD-10-CM | POA: Diagnosis not present

## 2023-05-09 DIAGNOSIS — D631 Anemia in chronic kidney disease: Secondary | ICD-10-CM | POA: Diagnosis not present

## 2023-05-10 ENCOUNTER — Ambulatory Visit (INDEPENDENT_AMBULATORY_CARE_PROVIDER_SITE_OTHER): Payer: 59 | Admitting: Internal Medicine

## 2023-05-10 ENCOUNTER — Other Ambulatory Visit (HOSPITAL_COMMUNITY)
Admission: RE | Admit: 2023-05-10 | Discharge: 2023-05-10 | Disposition: A | Discharge status: Home / Self Care | Payer: 59 | Source: Ambulatory Visit | Attending: Internal Medicine | Admitting: Internal Medicine

## 2023-05-10 ENCOUNTER — Other Ambulatory Visit: Payer: Self-pay

## 2023-05-10 ENCOUNTER — Encounter: Payer: Self-pay | Admitting: Internal Medicine

## 2023-05-10 VITALS — BP 109/63 | HR 86 | Temp 97.9°F | Ht 69.0 in | Wt 237.0 lb

## 2023-05-10 DIAGNOSIS — A599 Trichomoniasis, unspecified: Secondary | ICD-10-CM

## 2023-05-10 DIAGNOSIS — E1151 Type 2 diabetes mellitus with diabetic peripheral angiopathy without gangrene: Secondary | ICD-10-CM | POA: Diagnosis not present

## 2023-05-10 DIAGNOSIS — N186 End stage renal disease: Secondary | ICD-10-CM | POA: Diagnosis not present

## 2023-05-10 DIAGNOSIS — Z89612 Acquired absence of left leg above knee: Secondary | ICD-10-CM

## 2023-05-10 DIAGNOSIS — Z1211 Encounter for screening for malignant neoplasm of colon: Secondary | ICD-10-CM

## 2023-05-10 DIAGNOSIS — I5032 Chronic diastolic (congestive) heart failure: Secondary | ICD-10-CM | POA: Diagnosis not present

## 2023-05-10 DIAGNOSIS — I132 Hypertensive heart and chronic kidney disease with heart failure and with stage 5 chronic kidney disease, or end stage renal disease: Secondary | ICD-10-CM | POA: Diagnosis not present

## 2023-05-10 DIAGNOSIS — F17211 Nicotine dependence, cigarettes, in remission: Secondary | ICD-10-CM | POA: Diagnosis not present

## 2023-05-10 DIAGNOSIS — E1159 Type 2 diabetes mellitus with other circulatory complications: Secondary | ICD-10-CM

## 2023-05-10 DIAGNOSIS — E1122 Type 2 diabetes mellitus with diabetic chronic kidney disease: Secondary | ICD-10-CM | POA: Diagnosis not present

## 2023-05-10 DIAGNOSIS — Z992 Dependence on renal dialysis: Secondary | ICD-10-CM

## 2023-05-10 DIAGNOSIS — E782 Mixed hyperlipidemia: Secondary | ICD-10-CM

## 2023-05-10 DIAGNOSIS — Z720 Tobacco use: Secondary | ICD-10-CM

## 2023-05-10 DIAGNOSIS — Z0000 Encounter for general adult medical examination without abnormal findings: Secondary | ICD-10-CM

## 2023-05-10 DIAGNOSIS — I120 Hypertensive chronic kidney disease with stage 5 chronic kidney disease or end stage renal disease: Secondary | ICD-10-CM

## 2023-05-10 LAB — POCT GLYCOSYLATED HEMOGLOBIN (HGB A1C): Hemoglobin A1C: 4.4 % (ref 4.0–5.6)

## 2023-05-10 LAB — GLUCOSE, CAPILLARY: Glucose-Capillary: 78 mg/dL (ref 70–99)

## 2023-05-10 MED ORDER — LEVOTHYROXINE SODIUM 88 MCG PO TABS: 88.0000 ug | ORAL_TABLET | Freq: Every day | ORAL | Status: AC

## 2023-05-10 NOTE — Assessment & Plan Note (Signed)
Has not been smoking for 2 years now.

## 2023-05-10 NOTE — Progress Notes (Unsigned)
CC: follow up  HPI:  [name] is a 47 y.o. with medical history of HTN, HLD, DMII, ESRD on HD TThSat, chronic pain syndrome presenting to [facility] for a follow up. Last visit was with Dr. Rayvon Char in 01/2023 and was advised to come back in 3 weeks.   Please see problem-based list for further details, assessments, and plans.  Past Medical History:  Diagnosis Date   Abdominal muscle pain 09/08/2016   Abnormal Pap smear of cervix 2009   Abscess    history of multiple abscesses   Acute bilateral low back pain 02/13/2017   Acute blood loss anemia    Hx  Iron infusions   Acute on chronic renal failure (HCC) 07/12/2012   Acute renal failure (HCC) 07/12/2012   Acute respiratory failure with hypoxia (HCC) 10/21/2019   Adjustment disorder with depressed mood 03/26/2017   AKI (acute kidney injury) (HCC)    Dialysis t-th-s   Anemia 10/21/2019   Anemia of chronic disease 2002   Anginal pain (HCC)    Anxiety    Panic attacks   Bilateral lower extremity edema 05/13/2016   Bipolar disorder (HCC)    Cataract    B/L cataract   Cellulitis 05/21/2014   right eye   CHF (congestive heart failure) (HCC)    Chronic bronchitis (HCC)    "get it q yr" (05/13/2013)   Chronic pain    Chronic pain of right knee 09/08/2016   Crutches as ambulation aid    uses forearm crutchs, Left  AKA with prosthetic.   Dehiscence of amputation stump (HCC)    Depression    Edema of lower extremity    Endocarditis 2002   subacute bacterial endocarditis.    Fall    Family history of anesthesia complication    "my mom has a hard time coming out from under"   Fibromyalgia    GERD (gastroesophageal reflux disease)    occ   Heart murmur    never has had any problems   Herpes simplex type 1 infection 01/16/2018   History of blood transfusion    "just low blood count" (05/13/2013)   History of sexually transmitted infection    Hyperlipidemia    Hypertension    Hypothyroidism    Hypothyroidism, adult  03/21/2014   Hypoxia    Leukocytosis    Muscle spasm 12/16/2020   Necrosis (HCC)    and ulceration   Necrotizing fasciitis s/p OR debridements 07/06/2012   Obesity    OSA on CPAP    does not use CPAP   Peripheral neuropathy    Pneumonia    Pyelonephritis 12/31/2020   Routine adult health maintenance 12/08/2016   Severe protein-calorie malnutrition (HCC)    Status post below knee amputation, left (HCC) 04/11/2017   Type II diabetes mellitus (HCC)    Type  II   Vaginal irritation 03/14/2016   Weak pulse 07/21/2021   Wound dehiscence 09/04/2019    Current Outpatient Medications (Endocrine & Metabolic):    tirzepatide (MOUNJARO) 10 MG/0.5ML Pen, Inject 10 mg into the skin once a week.  Current Outpatient Medications (Cardiovascular):    amLODipine (NORVASC) 10 MG tablet, Take 1 tablet (10 mg total) by mouth daily.   atorvastatin (LIPITOR) 80 MG tablet, Take 1 tablet (80 mg total) by mouth daily.   furosemide (LASIX) 20 MG tablet, Take 1 tablet by mouth daily. Take on non-dialysis days. Adjust per kidney docs (Patient taking differently: Take 20 mg by mouth daily. Take on non-dialysis  days. Adjust per kidney docs)   metoprolol succinate (TOPROL-XL) 25 MG 24 hr tablet, Take 1 tablet (25 mg total) by mouth daily.  Current Outpatient Medications (Respiratory):    fluticasone (FLONASE) 50 MCG/ACT nasal spray, Place 1 spray into both nostrils daily. (Patient taking differently: Place 1 spray into both nostrils daily as needed for allergies.)  Current Outpatient Medications (Analgesics):    allopurinol (ZYLOPRIM) 100 MG tablet, Take 1 tablet by mouth 3 times a week. Take only after dialysis sessions. Do not take on non-dialysis days.   aspirin 81 MG chewable tablet, Chew 1 tablet (81 mg total) by mouth daily.   oxyCODONE-acetaminophen (PERCOCET) 7.5-325 MG tablet, Take 1 tablet by mouth every 8 (eight) hours as needed for moderate pain.   Current Outpatient Medications (Other):     Benzoyl Peroxide 2.5 % CREA, Apply 1 drop topically daily.   blood glucose meter kit and supplies KIT, ICD 10- E11.69. Based on patient and insurance preference. Use up to four times daily as directed.   buPROPion (WELLBUTRIN) 100 MG tablet, Take 1 tablet (100 mg total) by mouth every other day.   cyclobenzaprine (FLEXERIL) 5 MG tablet, Take 1 tablet by mouth daily as needed for muscle spasms. (Patient not taking: Reported on 12/28/2022)   diclofenac Sodium (VOLTAREN) 1 % GEL, APPLY 1 GRAM TOPICALLY TO AFFECTED AREA 4 TIMES DAILY AS NEEDED (Patient taking differently: Apply 1 g topically 4 (four) times daily as needed (for pain).)   hydrOXYzine (ATARAX) 25 MG tablet, Take 1 tablet (25 mg total) by mouth 3 (three) times daily as needed for anxiety.   Insulin Pen Needle (TRUEPLUS PEN NEEDLES) 31G X 5 MM MISC, 1 each by Other route 3 (three) times daily.   ketoconazole (NIZORAL) 2 % cream, Apply 1 application topically to affected area daily.   ofloxacin (OCUFLOX) 0.3 % ophthalmic solution, Place 1 drop into the left eye in the morning and at bedtime.   sevelamer carbonate (RENVELA) 800 MG tablet, Take 6 to 7 tablets by mouth twice a day with meals (Patient not taking: Reported on 12/28/2022)   tretinoin (RETIN-A) 0.025 % cream, Apply topically at bedtime.   Vitamin D, Ergocalciferol, (DRISDOL) 1.25 MG (50000 UNIT) CAPS capsule, Take 1 capsule (50,000 Units total) by mouth every 7 (seven) days.  Review of Systems:  Review of system negative unless stated in the problem list or HPI.    Physical Exam:  Vitals:   05/10/23 1048  BP: 109/63  Pulse: 86  Temp: 97.9 F (36.6 C)  TempSrc: Oral  SpO2: 100%  Weight: 237 lb (107.5 kg)  Height: 5\' 9"  (1.753 m)   Physical Exam General: NAD HENT: NCAT Lungs: CTAB, no wheeze, rhonchi or rales.  Cardiovascular: Normal heart sounds, no r/m/g, 2+ pulses in all extremities. No LE edema Abdomen: No TTP, normal bowel sounds MSK: No asymmetry or muscle atrophy.   Skin: no lesions noted on exposed skin Neuro: Alert and oriented x4. CN grossly intact Psych: Normal mood and normal affect   Assessment & Plan:   No problem-specific Assessment & Plan notes found for this encounter.   See Encounters Tab for problem based charting.  Patient Discussed with Dr. Karrie Meres, MD Eligha Bridegroom. [facility] Internal Medicine Residency, PGY-3   HTN On amlodipine 10 mg every day. Well controlled.   DMII A1c this visit 4.4. Previously 6.6. On mounjaro 15 mg weekly. Weight 237 today down from 290 previously in 06/2022  Subclinical Hypothyroidism:  ESRD/HFpEF: undergoing transplant evaluation. Needs to work with PT.   HLD Last LDL 65 in 10/2022. On Lipitor 80 mg, will continue and repeat lipid panel next year around 10/2022.  Needs new socket referral DME.   Needs new wheelchair. Last one was 6 years ago.   Care Gaps Referral to colonoscpy Had eye exam.

## 2023-05-10 NOTE — Patient Instructions (Addendum)
[name], it was a pleasure seeing you today! You endorsed feeling well today. Below are some of the things we talked about this visit. We look forward to seeing you in the follow up appointment!  Today we discussed: Continue taking your medications. Continue Mounjaro at the current dose.   Your diabetes is under good control and you are losing weight.   We will order new supplies that you need.   I am referring you to stomach doctor for a colonoscopy.   I have ordered the following labs today:   Lab Orders         Glucose, capillary         POC Hbg A1C       Referrals ordered today:   Referral Orders  No referral(s) requested today     I have ordered the following medication/changed the following medications:   Stop the following medications: Medications Discontinued During This Encounter  Medication Reason   levothyroxine (SYNTHROID) 88 MCG tablet Patient has not taken in last 30 days     Start the following medications: No orders of the defined types were placed in this encounter.    Follow-up: 3 month follow up with PCP   Please make sure to arrive 15 minutes prior to your next appointment. If you arrive late, you may be asked to reschedule.   We look forward to seeing you next time. Please call our clinic at [phone] if you have any questions or concerns. The best time to call is Monday-Friday from 9am-4pm, but there is someone available 24/7. If after hours or the weekend, call the main hospital number and ask for the Internal Medicine Resident On-Call. If you need medication refills, please notify your pharmacy one week in advance and they will send [name] [name] a request.  Thank you for letting [name] [name] take part in your care. Wishing you the best!  Thank you, Gwenevere Abbot, MD

## 2023-05-11 ENCOUNTER — Telehealth: Payer: Self-pay | Admitting: *Deleted

## 2023-05-11 DIAGNOSIS — D509 Iron deficiency anemia, unspecified: Secondary | ICD-10-CM | POA: Diagnosis not present

## 2023-05-11 DIAGNOSIS — N186 End stage renal disease: Secondary | ICD-10-CM | POA: Diagnosis not present

## 2023-05-11 DIAGNOSIS — D689 Coagulation defect, unspecified: Secondary | ICD-10-CM | POA: Diagnosis not present

## 2023-05-11 DIAGNOSIS — Z992 Dependence on renal dialysis: Secondary | ICD-10-CM | POA: Diagnosis not present

## 2023-05-11 DIAGNOSIS — L299 Pruritus, unspecified: Secondary | ICD-10-CM | POA: Diagnosis not present

## 2023-05-11 DIAGNOSIS — R52 Pain, unspecified: Secondary | ICD-10-CM | POA: Diagnosis not present

## 2023-05-11 DIAGNOSIS — N2581 Secondary hyperparathyroidism of renal origin: Secondary | ICD-10-CM | POA: Diagnosis not present

## 2023-05-11 DIAGNOSIS — D631 Anemia in chronic kidney disease: Secondary | ICD-10-CM | POA: Diagnosis not present

## 2023-05-11 LAB — CERVICOVAGINAL ANCILLARY ONLY
Comment: NEGATIVE
Trichomonas: NEGATIVE

## 2023-05-11 NOTE — Assessment & Plan Note (Addendum)
On amlodipine 10 mg every day. Well controlled. Compliant with HD. Pt undergoing transplant evaluation.

## 2023-05-11 NOTE — Assessment & Plan Note (Signed)
Last LDL 65 in 10/2022. On Lipitor 80 mg, will continue and repeat lipid panel next year around 10/2022.

## 2023-05-11 NOTE — Assessment & Plan Note (Signed)
A1c this visit 4.4. Previously 6.6. On mounjaro 15 mg weekly. Weight 237 today down from 290 previously in 06/2022. No reports of hypoglycemia or hypoglycemic symptoms. Will have pt continue mounjaro at current dose.

## 2023-05-11 NOTE — Telephone Encounter (Signed)
Call to the [facility] to see if patient could have Thyroid Study labs drawn with the labs drawn [facility].   Spoke with Misty Stanley who offered option of having labs drawn and having someone come over to pick up the tubes. Spoke with having Dr. Welton Flakes speak with Dr. Signe Colt or the PA on call today.  No ne is available to speak with.  Will fax over lab request and see if Dr. Signe Colt or the PA will add to patient's blood draw and have results sent to the Clinics.

## 2023-05-11 NOTE — Assessment & Plan Note (Signed)
Referral for colonoscopy placed. ?

## 2023-05-11 NOTE — Assessment & Plan Note (Signed)
Pt with HFpEF currently taking lasix on non-HD days. Reports some urine output. Euvolemic on exam today. Advised strict adherence to her HD.

## 2023-05-11 NOTE — Assessment & Plan Note (Signed)
Needs new socket for her prosthesis. Needs new wheelchair. Last one was 6 years ago. Referral for both placed this visit.

## 2023-05-11 NOTE — Assessment & Plan Note (Signed)
Repeat trichomonas testing performed this visit to check for clearance. Pt without any symptoms.

## 2023-05-13 DIAGNOSIS — D631 Anemia in chronic kidney disease: Secondary | ICD-10-CM | POA: Diagnosis not present

## 2023-05-13 DIAGNOSIS — L299 Pruritus, unspecified: Secondary | ICD-10-CM | POA: Diagnosis not present

## 2023-05-13 DIAGNOSIS — D689 Coagulation defect, unspecified: Secondary | ICD-10-CM | POA: Diagnosis not present

## 2023-05-13 DIAGNOSIS — N186 End stage renal disease: Secondary | ICD-10-CM | POA: Diagnosis not present

## 2023-05-13 DIAGNOSIS — R52 Pain, unspecified: Secondary | ICD-10-CM | POA: Diagnosis not present

## 2023-05-13 DIAGNOSIS — N2581 Secondary hyperparathyroidism of renal origin: Secondary | ICD-10-CM | POA: Diagnosis not present

## 2023-05-13 DIAGNOSIS — Z992 Dependence on renal dialysis: Secondary | ICD-10-CM | POA: Diagnosis not present

## 2023-05-13 DIAGNOSIS — D509 Iron deficiency anemia, unspecified: Secondary | ICD-10-CM | POA: Diagnosis not present

## 2023-05-15 DIAGNOSIS — D631 Anemia in chronic kidney disease: Secondary | ICD-10-CM | POA: Diagnosis not present

## 2023-05-15 DIAGNOSIS — D509 Iron deficiency anemia, unspecified: Secondary | ICD-10-CM | POA: Diagnosis not present

## 2023-05-15 DIAGNOSIS — L299 Pruritus, unspecified: Secondary | ICD-10-CM | POA: Diagnosis not present

## 2023-05-15 DIAGNOSIS — D689 Coagulation defect, unspecified: Secondary | ICD-10-CM | POA: Diagnosis not present

## 2023-05-15 DIAGNOSIS — N186 End stage renal disease: Secondary | ICD-10-CM | POA: Diagnosis not present

## 2023-05-15 DIAGNOSIS — R52 Pain, unspecified: Secondary | ICD-10-CM | POA: Diagnosis not present

## 2023-05-15 DIAGNOSIS — Z992 Dependence on renal dialysis: Secondary | ICD-10-CM | POA: Diagnosis not present

## 2023-05-15 DIAGNOSIS — N2581 Secondary hyperparathyroidism of renal origin: Secondary | ICD-10-CM | POA: Diagnosis not present

## 2023-05-15 NOTE — Progress Notes (Signed)
Internal Medicine Clinic Attending  Case discussed with the resident at the time of the visit.  We reviewed the resident's history and exam and pertinent patient test results.  I agree with the assessment, diagnosis, and plan of care documented in the resident's note.  

## 2023-05-18 DIAGNOSIS — D509 Iron deficiency anemia, unspecified: Secondary | ICD-10-CM | POA: Diagnosis not present

## 2023-05-18 DIAGNOSIS — N186 End stage renal disease: Secondary | ICD-10-CM | POA: Diagnosis not present

## 2023-05-18 DIAGNOSIS — R52 Pain, unspecified: Secondary | ICD-10-CM | POA: Diagnosis not present

## 2023-05-18 DIAGNOSIS — Z992 Dependence on renal dialysis: Secondary | ICD-10-CM | POA: Diagnosis not present

## 2023-05-18 DIAGNOSIS — D689 Coagulation defect, unspecified: Secondary | ICD-10-CM | POA: Diagnosis not present

## 2023-05-18 DIAGNOSIS — L299 Pruritus, unspecified: Secondary | ICD-10-CM | POA: Diagnosis not present

## 2023-05-18 DIAGNOSIS — N2581 Secondary hyperparathyroidism of renal origin: Secondary | ICD-10-CM | POA: Diagnosis not present

## 2023-05-18 DIAGNOSIS — D631 Anemia in chronic kidney disease: Secondary | ICD-10-CM | POA: Diagnosis not present

## 2023-05-20 DIAGNOSIS — L299 Pruritus, unspecified: Secondary | ICD-10-CM | POA: Diagnosis not present

## 2023-05-20 DIAGNOSIS — D631 Anemia in chronic kidney disease: Secondary | ICD-10-CM | POA: Diagnosis not present

## 2023-05-20 DIAGNOSIS — R52 Pain, unspecified: Secondary | ICD-10-CM | POA: Diagnosis not present

## 2023-05-20 DIAGNOSIS — N2581 Secondary hyperparathyroidism of renal origin: Secondary | ICD-10-CM | POA: Diagnosis not present

## 2023-05-20 DIAGNOSIS — N186 End stage renal disease: Secondary | ICD-10-CM | POA: Diagnosis not present

## 2023-05-20 DIAGNOSIS — D689 Coagulation defect, unspecified: Secondary | ICD-10-CM | POA: Diagnosis not present

## 2023-05-20 DIAGNOSIS — D509 Iron deficiency anemia, unspecified: Secondary | ICD-10-CM | POA: Diagnosis not present

## 2023-05-20 DIAGNOSIS — Z992 Dependence on renal dialysis: Secondary | ICD-10-CM | POA: Diagnosis not present

## 2023-05-22 DIAGNOSIS — L299 Pruritus, unspecified: Secondary | ICD-10-CM | POA: Diagnosis not present

## 2023-05-22 DIAGNOSIS — D689 Coagulation defect, unspecified: Secondary | ICD-10-CM | POA: Diagnosis not present

## 2023-05-22 DIAGNOSIS — Z992 Dependence on renal dialysis: Secondary | ICD-10-CM | POA: Diagnosis not present

## 2023-05-22 DIAGNOSIS — D631 Anemia in chronic kidney disease: Secondary | ICD-10-CM | POA: Diagnosis not present

## 2023-05-22 DIAGNOSIS — N186 End stage renal disease: Secondary | ICD-10-CM | POA: Diagnosis not present

## 2023-05-22 DIAGNOSIS — D509 Iron deficiency anemia, unspecified: Secondary | ICD-10-CM | POA: Diagnosis not present

## 2023-05-22 DIAGNOSIS — N2581 Secondary hyperparathyroidism of renal origin: Secondary | ICD-10-CM | POA: Diagnosis not present

## 2023-05-22 DIAGNOSIS — R52 Pain, unspecified: Secondary | ICD-10-CM | POA: Diagnosis not present

## 2023-05-23 DIAGNOSIS — E1122 Type 2 diabetes mellitus with diabetic chronic kidney disease: Secondary | ICD-10-CM | POA: Diagnosis not present

## 2023-05-23 DIAGNOSIS — N186 End stage renal disease: Secondary | ICD-10-CM | POA: Diagnosis not present

## 2023-05-23 DIAGNOSIS — Z992 Dependence on renal dialysis: Secondary | ICD-10-CM | POA: Diagnosis not present

## 2023-05-25 DIAGNOSIS — D509 Iron deficiency anemia, unspecified: Secondary | ICD-10-CM | POA: Diagnosis not present

## 2023-05-25 DIAGNOSIS — N186 End stage renal disease: Secondary | ICD-10-CM | POA: Diagnosis not present

## 2023-05-25 DIAGNOSIS — E1122 Type 2 diabetes mellitus with diabetic chronic kidney disease: Secondary | ICD-10-CM | POA: Diagnosis not present

## 2023-05-25 DIAGNOSIS — D631 Anemia in chronic kidney disease: Secondary | ICD-10-CM | POA: Diagnosis not present

## 2023-05-25 DIAGNOSIS — N2581 Secondary hyperparathyroidism of renal origin: Secondary | ICD-10-CM | POA: Diagnosis not present

## 2023-05-25 DIAGNOSIS — D689 Coagulation defect, unspecified: Secondary | ICD-10-CM | POA: Diagnosis not present

## 2023-05-25 DIAGNOSIS — Z992 Dependence on renal dialysis: Secondary | ICD-10-CM | POA: Diagnosis not present

## 2023-05-25 DIAGNOSIS — R52 Pain, unspecified: Secondary | ICD-10-CM | POA: Diagnosis not present

## 2023-05-27 DIAGNOSIS — N2581 Secondary hyperparathyroidism of renal origin: Secondary | ICD-10-CM | POA: Diagnosis not present

## 2023-05-27 DIAGNOSIS — D689 Coagulation defect, unspecified: Secondary | ICD-10-CM | POA: Diagnosis not present

## 2023-05-27 DIAGNOSIS — Z992 Dependence on renal dialysis: Secondary | ICD-10-CM | POA: Diagnosis not present

## 2023-05-27 DIAGNOSIS — E1122 Type 2 diabetes mellitus with diabetic chronic kidney disease: Secondary | ICD-10-CM | POA: Diagnosis not present

## 2023-05-27 DIAGNOSIS — D509 Iron deficiency anemia, unspecified: Secondary | ICD-10-CM | POA: Diagnosis not present

## 2023-05-27 DIAGNOSIS — D631 Anemia in chronic kidney disease: Secondary | ICD-10-CM | POA: Diagnosis not present

## 2023-05-27 DIAGNOSIS — R52 Pain, unspecified: Secondary | ICD-10-CM | POA: Diagnosis not present

## 2023-05-27 DIAGNOSIS — N186 End stage renal disease: Secondary | ICD-10-CM | POA: Diagnosis not present

## 2023-05-30 DIAGNOSIS — D509 Iron deficiency anemia, unspecified: Secondary | ICD-10-CM | POA: Diagnosis not present

## 2023-05-30 DIAGNOSIS — N2581 Secondary hyperparathyroidism of renal origin: Secondary | ICD-10-CM | POA: Diagnosis not present

## 2023-05-30 DIAGNOSIS — R52 Pain, unspecified: Secondary | ICD-10-CM | POA: Diagnosis not present

## 2023-05-30 DIAGNOSIS — E1122 Type 2 diabetes mellitus with diabetic chronic kidney disease: Secondary | ICD-10-CM | POA: Diagnosis not present

## 2023-05-30 DIAGNOSIS — N186 End stage renal disease: Secondary | ICD-10-CM | POA: Diagnosis not present

## 2023-05-30 DIAGNOSIS — D689 Coagulation defect, unspecified: Secondary | ICD-10-CM | POA: Diagnosis not present

## 2023-05-30 DIAGNOSIS — D631 Anemia in chronic kidney disease: Secondary | ICD-10-CM | POA: Diagnosis not present

## 2023-05-30 DIAGNOSIS — Z992 Dependence on renal dialysis: Secondary | ICD-10-CM | POA: Diagnosis not present

## 2023-06-01 DIAGNOSIS — D509 Iron deficiency anemia, unspecified: Secondary | ICD-10-CM | POA: Diagnosis not present

## 2023-06-01 DIAGNOSIS — E1122 Type 2 diabetes mellitus with diabetic chronic kidney disease: Secondary | ICD-10-CM | POA: Diagnosis not present

## 2023-06-01 DIAGNOSIS — D631 Anemia in chronic kidney disease: Secondary | ICD-10-CM | POA: Diagnosis not present

## 2023-06-01 DIAGNOSIS — N186 End stage renal disease: Secondary | ICD-10-CM | POA: Diagnosis not present

## 2023-06-01 DIAGNOSIS — Z992 Dependence on renal dialysis: Secondary | ICD-10-CM | POA: Diagnosis not present

## 2023-06-01 DIAGNOSIS — D689 Coagulation defect, unspecified: Secondary | ICD-10-CM | POA: Diagnosis not present

## 2023-06-01 DIAGNOSIS — R52 Pain, unspecified: Secondary | ICD-10-CM | POA: Diagnosis not present

## 2023-06-01 DIAGNOSIS — N2581 Secondary hyperparathyroidism of renal origin: Secondary | ICD-10-CM | POA: Diagnosis not present

## 2023-06-03 DIAGNOSIS — E1122 Type 2 diabetes mellitus with diabetic chronic kidney disease: Secondary | ICD-10-CM | POA: Diagnosis not present

## 2023-06-03 DIAGNOSIS — D689 Coagulation defect, unspecified: Secondary | ICD-10-CM | POA: Diagnosis not present

## 2023-06-03 DIAGNOSIS — D509 Iron deficiency anemia, unspecified: Secondary | ICD-10-CM | POA: Diagnosis not present

## 2023-06-03 DIAGNOSIS — Z992 Dependence on renal dialysis: Secondary | ICD-10-CM | POA: Diagnosis not present

## 2023-06-03 DIAGNOSIS — N186 End stage renal disease: Secondary | ICD-10-CM | POA: Diagnosis not present

## 2023-06-03 DIAGNOSIS — R52 Pain, unspecified: Secondary | ICD-10-CM | POA: Diagnosis not present

## 2023-06-03 DIAGNOSIS — D631 Anemia in chronic kidney disease: Secondary | ICD-10-CM | POA: Diagnosis not present

## 2023-06-03 DIAGNOSIS — N2581 Secondary hyperparathyroidism of renal origin: Secondary | ICD-10-CM | POA: Diagnosis not present

## 2023-06-05 ENCOUNTER — Other Ambulatory Visit: Payer: Self-pay | Admitting: Student

## 2023-06-05 ENCOUNTER — Telehealth: Payer: Self-pay | Admitting: *Deleted

## 2023-06-05 DIAGNOSIS — L709 Acne, unspecified: Secondary | ICD-10-CM

## 2023-06-05 NOTE — Telephone Encounter (Signed)
 Received a call from [facility] who stated they received refill rx for Retin-A cream today. Stated they need to know if it's for  15 or 30 days?

## 2023-06-05 NOTE — Telephone Encounter (Signed)
 Medication sent to pharmacy

## 2023-06-06 DIAGNOSIS — Z992 Dependence on renal dialysis: Secondary | ICD-10-CM | POA: Diagnosis not present

## 2023-06-06 DIAGNOSIS — D509 Iron deficiency anemia, unspecified: Secondary | ICD-10-CM | POA: Diagnosis not present

## 2023-06-06 DIAGNOSIS — D631 Anemia in chronic kidney disease: Secondary | ICD-10-CM | POA: Diagnosis not present

## 2023-06-06 DIAGNOSIS — N186 End stage renal disease: Secondary | ICD-10-CM | POA: Diagnosis not present

## 2023-06-06 DIAGNOSIS — E1122 Type 2 diabetes mellitus with diabetic chronic kidney disease: Secondary | ICD-10-CM | POA: Diagnosis not present

## 2023-06-06 DIAGNOSIS — D689 Coagulation defect, unspecified: Secondary | ICD-10-CM | POA: Diagnosis not present

## 2023-06-06 DIAGNOSIS — N2581 Secondary hyperparathyroidism of renal origin: Secondary | ICD-10-CM | POA: Diagnosis not present

## 2023-06-06 DIAGNOSIS — R52 Pain, unspecified: Secondary | ICD-10-CM | POA: Diagnosis not present

## 2023-06-08 DIAGNOSIS — D509 Iron deficiency anemia, unspecified: Secondary | ICD-10-CM | POA: Diagnosis not present

## 2023-06-08 DIAGNOSIS — R52 Pain, unspecified: Secondary | ICD-10-CM | POA: Diagnosis not present

## 2023-06-08 DIAGNOSIS — D689 Coagulation defect, unspecified: Secondary | ICD-10-CM | POA: Diagnosis not present

## 2023-06-08 DIAGNOSIS — Z992 Dependence on renal dialysis: Secondary | ICD-10-CM | POA: Diagnosis not present

## 2023-06-08 DIAGNOSIS — N2581 Secondary hyperparathyroidism of renal origin: Secondary | ICD-10-CM | POA: Diagnosis not present

## 2023-06-08 DIAGNOSIS — N186 End stage renal disease: Secondary | ICD-10-CM | POA: Diagnosis not present

## 2023-06-08 DIAGNOSIS — E1122 Type 2 diabetes mellitus with diabetic chronic kidney disease: Secondary | ICD-10-CM | POA: Diagnosis not present

## 2023-06-08 DIAGNOSIS — D631 Anemia in chronic kidney disease: Secondary | ICD-10-CM | POA: Diagnosis not present

## 2023-06-09 DIAGNOSIS — Z79891 Long term (current) use of opiate analgesic: Secondary | ICD-10-CM | POA: Diagnosis not present

## 2023-06-09 DIAGNOSIS — Z992 Dependence on renal dialysis: Secondary | ICD-10-CM | POA: Diagnosis not present

## 2023-06-09 DIAGNOSIS — M545 Low back pain, unspecified: Secondary | ICD-10-CM | POA: Diagnosis not present

## 2023-06-09 DIAGNOSIS — G894 Chronic pain syndrome: Secondary | ICD-10-CM | POA: Diagnosis not present

## 2023-06-09 DIAGNOSIS — I159 Secondary hypertension, unspecified: Secondary | ICD-10-CM | POA: Diagnosis not present

## 2023-06-10 DIAGNOSIS — D631 Anemia in chronic kidney disease: Secondary | ICD-10-CM | POA: Diagnosis not present

## 2023-06-10 DIAGNOSIS — R52 Pain, unspecified: Secondary | ICD-10-CM | POA: Diagnosis not present

## 2023-06-10 DIAGNOSIS — D689 Coagulation defect, unspecified: Secondary | ICD-10-CM | POA: Diagnosis not present

## 2023-06-10 DIAGNOSIS — D509 Iron deficiency anemia, unspecified: Secondary | ICD-10-CM | POA: Diagnosis not present

## 2023-06-10 DIAGNOSIS — N2581 Secondary hyperparathyroidism of renal origin: Secondary | ICD-10-CM | POA: Diagnosis not present

## 2023-06-10 DIAGNOSIS — N186 End stage renal disease: Secondary | ICD-10-CM | POA: Diagnosis not present

## 2023-06-10 DIAGNOSIS — E1122 Type 2 diabetes mellitus with diabetic chronic kidney disease: Secondary | ICD-10-CM | POA: Diagnosis not present

## 2023-06-10 DIAGNOSIS — Z992 Dependence on renal dialysis: Secondary | ICD-10-CM | POA: Diagnosis not present

## 2023-06-13 DIAGNOSIS — D509 Iron deficiency anemia, unspecified: Secondary | ICD-10-CM | POA: Diagnosis not present

## 2023-06-13 DIAGNOSIS — E1122 Type 2 diabetes mellitus with diabetic chronic kidney disease: Secondary | ICD-10-CM | POA: Diagnosis not present

## 2023-06-13 DIAGNOSIS — D689 Coagulation defect, unspecified: Secondary | ICD-10-CM | POA: Diagnosis not present

## 2023-06-13 DIAGNOSIS — N2581 Secondary hyperparathyroidism of renal origin: Secondary | ICD-10-CM | POA: Diagnosis not present

## 2023-06-13 DIAGNOSIS — R52 Pain, unspecified: Secondary | ICD-10-CM | POA: Diagnosis not present

## 2023-06-13 DIAGNOSIS — Z992 Dependence on renal dialysis: Secondary | ICD-10-CM | POA: Diagnosis not present

## 2023-06-13 DIAGNOSIS — N186 End stage renal disease: Secondary | ICD-10-CM | POA: Diagnosis not present

## 2023-06-13 DIAGNOSIS — D631 Anemia in chronic kidney disease: Secondary | ICD-10-CM | POA: Diagnosis not present

## 2023-06-13 NOTE — Telephone Encounter (Signed)
[facility] called and informed Retin-A cream is for 30 days.

## 2023-06-13 NOTE — Telephone Encounter (Addendum)
[facility] stated for their auditing purposes; they need to know if the Retin-A cream is for 15 days or  30 days?

## 2023-06-15 DIAGNOSIS — R52 Pain, unspecified: Secondary | ICD-10-CM | POA: Diagnosis not present

## 2023-06-15 DIAGNOSIS — D631 Anemia in chronic kidney disease: Secondary | ICD-10-CM | POA: Diagnosis not present

## 2023-06-15 DIAGNOSIS — N2581 Secondary hyperparathyroidism of renal origin: Secondary | ICD-10-CM | POA: Diagnosis not present

## 2023-06-15 DIAGNOSIS — E1122 Type 2 diabetes mellitus with diabetic chronic kidney disease: Secondary | ICD-10-CM | POA: Diagnosis not present

## 2023-06-15 DIAGNOSIS — D689 Coagulation defect, unspecified: Secondary | ICD-10-CM | POA: Diagnosis not present

## 2023-06-15 DIAGNOSIS — D509 Iron deficiency anemia, unspecified: Secondary | ICD-10-CM | POA: Diagnosis not present

## 2023-06-15 DIAGNOSIS — N186 End stage renal disease: Secondary | ICD-10-CM | POA: Diagnosis not present

## 2023-06-15 DIAGNOSIS — Z992 Dependence on renal dialysis: Secondary | ICD-10-CM | POA: Diagnosis not present

## 2023-06-17 DIAGNOSIS — D631 Anemia in chronic kidney disease: Secondary | ICD-10-CM | POA: Diagnosis not present

## 2023-06-17 DIAGNOSIS — D509 Iron deficiency anemia, unspecified: Secondary | ICD-10-CM | POA: Diagnosis not present

## 2023-06-17 DIAGNOSIS — N186 End stage renal disease: Secondary | ICD-10-CM | POA: Diagnosis not present

## 2023-06-17 DIAGNOSIS — E1122 Type 2 diabetes mellitus with diabetic chronic kidney disease: Secondary | ICD-10-CM | POA: Diagnosis not present

## 2023-06-17 DIAGNOSIS — Z992 Dependence on renal dialysis: Secondary | ICD-10-CM | POA: Diagnosis not present

## 2023-06-17 DIAGNOSIS — D689 Coagulation defect, unspecified: Secondary | ICD-10-CM | POA: Diagnosis not present

## 2023-06-17 DIAGNOSIS — R52 Pain, unspecified: Secondary | ICD-10-CM | POA: Diagnosis not present

## 2023-06-17 DIAGNOSIS — N2581 Secondary hyperparathyroidism of renal origin: Secondary | ICD-10-CM | POA: Diagnosis not present

## 2023-06-20 DIAGNOSIS — R52 Pain, unspecified: Secondary | ICD-10-CM | POA: Diagnosis not present

## 2023-06-20 DIAGNOSIS — Z992 Dependence on renal dialysis: Secondary | ICD-10-CM | POA: Diagnosis not present

## 2023-06-20 DIAGNOSIS — D509 Iron deficiency anemia, unspecified: Secondary | ICD-10-CM | POA: Diagnosis not present

## 2023-06-20 DIAGNOSIS — D689 Coagulation defect, unspecified: Secondary | ICD-10-CM | POA: Diagnosis not present

## 2023-06-20 DIAGNOSIS — D631 Anemia in chronic kidney disease: Secondary | ICD-10-CM | POA: Diagnosis not present

## 2023-06-20 DIAGNOSIS — N2581 Secondary hyperparathyroidism of renal origin: Secondary | ICD-10-CM | POA: Diagnosis not present

## 2023-06-20 DIAGNOSIS — E1122 Type 2 diabetes mellitus with diabetic chronic kidney disease: Secondary | ICD-10-CM | POA: Diagnosis not present

## 2023-06-20 DIAGNOSIS — N186 End stage renal disease: Secondary | ICD-10-CM | POA: Diagnosis not present

## 2023-06-22 DIAGNOSIS — E1122 Type 2 diabetes mellitus with diabetic chronic kidney disease: Secondary | ICD-10-CM | POA: Diagnosis not present

## 2023-06-22 DIAGNOSIS — Z992 Dependence on renal dialysis: Secondary | ICD-10-CM | POA: Diagnosis not present

## 2023-06-22 DIAGNOSIS — R52 Pain, unspecified: Secondary | ICD-10-CM | POA: Diagnosis not present

## 2023-06-22 DIAGNOSIS — D689 Coagulation defect, unspecified: Secondary | ICD-10-CM | POA: Diagnosis not present

## 2023-06-22 DIAGNOSIS — N186 End stage renal disease: Secondary | ICD-10-CM | POA: Diagnosis not present

## 2023-06-22 DIAGNOSIS — D631 Anemia in chronic kidney disease: Secondary | ICD-10-CM | POA: Diagnosis not present

## 2023-06-22 DIAGNOSIS — N2581 Secondary hyperparathyroidism of renal origin: Secondary | ICD-10-CM | POA: Diagnosis not present

## 2023-06-22 DIAGNOSIS — D509 Iron deficiency anemia, unspecified: Secondary | ICD-10-CM | POA: Diagnosis not present

## 2023-06-23 DIAGNOSIS — N186 End stage renal disease: Secondary | ICD-10-CM | POA: Diagnosis not present

## 2023-06-23 DIAGNOSIS — E1122 Type 2 diabetes mellitus with diabetic chronic kidney disease: Secondary | ICD-10-CM | POA: Diagnosis not present

## 2023-06-23 DIAGNOSIS — Z992 Dependence on renal dialysis: Secondary | ICD-10-CM | POA: Diagnosis not present

## 2023-06-24 DIAGNOSIS — Z992 Dependence on renal dialysis: Secondary | ICD-10-CM | POA: Diagnosis not present

## 2023-06-24 DIAGNOSIS — N186 End stage renal disease: Secondary | ICD-10-CM | POA: Diagnosis not present

## 2023-06-24 DIAGNOSIS — D631 Anemia in chronic kidney disease: Secondary | ICD-10-CM | POA: Diagnosis not present

## 2023-06-24 DIAGNOSIS — D689 Coagulation defect, unspecified: Secondary | ICD-10-CM | POA: Diagnosis not present

## 2023-06-24 DIAGNOSIS — D509 Iron deficiency anemia, unspecified: Secondary | ICD-10-CM | POA: Diagnosis not present

## 2023-06-24 DIAGNOSIS — N2581 Secondary hyperparathyroidism of renal origin: Secondary | ICD-10-CM | POA: Diagnosis not present

## 2023-06-24 DIAGNOSIS — R52 Pain, unspecified: Secondary | ICD-10-CM | POA: Diagnosis not present

## 2023-06-27 DIAGNOSIS — N2581 Secondary hyperparathyroidism of renal origin: Secondary | ICD-10-CM | POA: Diagnosis not present

## 2023-06-27 DIAGNOSIS — Z992 Dependence on renal dialysis: Secondary | ICD-10-CM | POA: Diagnosis not present

## 2023-06-27 DIAGNOSIS — R52 Pain, unspecified: Secondary | ICD-10-CM | POA: Diagnosis not present

## 2023-06-27 DIAGNOSIS — D631 Anemia in chronic kidney disease: Secondary | ICD-10-CM | POA: Diagnosis not present

## 2023-06-27 DIAGNOSIS — D509 Iron deficiency anemia, unspecified: Secondary | ICD-10-CM | POA: Diagnosis not present

## 2023-06-27 DIAGNOSIS — D689 Coagulation defect, unspecified: Secondary | ICD-10-CM | POA: Diagnosis not present

## 2023-06-27 DIAGNOSIS — N186 End stage renal disease: Secondary | ICD-10-CM | POA: Diagnosis not present

## 2023-06-29 DIAGNOSIS — N186 End stage renal disease: Secondary | ICD-10-CM | POA: Diagnosis not present

## 2023-06-29 DIAGNOSIS — D509 Iron deficiency anemia, unspecified: Secondary | ICD-10-CM | POA: Diagnosis not present

## 2023-06-29 DIAGNOSIS — Z992 Dependence on renal dialysis: Secondary | ICD-10-CM | POA: Diagnosis not present

## 2023-06-29 DIAGNOSIS — D689 Coagulation defect, unspecified: Secondary | ICD-10-CM | POA: Diagnosis not present

## 2023-06-29 DIAGNOSIS — R52 Pain, unspecified: Secondary | ICD-10-CM | POA: Diagnosis not present

## 2023-06-29 DIAGNOSIS — D631 Anemia in chronic kidney disease: Secondary | ICD-10-CM | POA: Diagnosis not present

## 2023-06-29 DIAGNOSIS — N2581 Secondary hyperparathyroidism of renal origin: Secondary | ICD-10-CM | POA: Diagnosis not present

## 2023-07-01 DIAGNOSIS — D689 Coagulation defect, unspecified: Secondary | ICD-10-CM | POA: Diagnosis not present

## 2023-07-01 DIAGNOSIS — Z992 Dependence on renal dialysis: Secondary | ICD-10-CM | POA: Diagnosis not present

## 2023-07-01 DIAGNOSIS — N186 End stage renal disease: Secondary | ICD-10-CM | POA: Diagnosis not present

## 2023-07-01 DIAGNOSIS — D631 Anemia in chronic kidney disease: Secondary | ICD-10-CM | POA: Diagnosis not present

## 2023-07-01 DIAGNOSIS — N2581 Secondary hyperparathyroidism of renal origin: Secondary | ICD-10-CM | POA: Diagnosis not present

## 2023-07-01 DIAGNOSIS — D509 Iron deficiency anemia, unspecified: Secondary | ICD-10-CM | POA: Diagnosis not present

## 2023-07-01 DIAGNOSIS — R52 Pain, unspecified: Secondary | ICD-10-CM | POA: Diagnosis not present

## 2023-07-04 DIAGNOSIS — D631 Anemia in chronic kidney disease: Secondary | ICD-10-CM | POA: Diagnosis not present

## 2023-07-04 DIAGNOSIS — N2581 Secondary hyperparathyroidism of renal origin: Secondary | ICD-10-CM | POA: Diagnosis not present

## 2023-07-04 DIAGNOSIS — D689 Coagulation defect, unspecified: Secondary | ICD-10-CM | POA: Diagnosis not present

## 2023-07-04 DIAGNOSIS — N186 End stage renal disease: Secondary | ICD-10-CM | POA: Diagnosis not present

## 2023-07-04 DIAGNOSIS — Z992 Dependence on renal dialysis: Secondary | ICD-10-CM | POA: Diagnosis not present

## 2023-07-04 DIAGNOSIS — D509 Iron deficiency anemia, unspecified: Secondary | ICD-10-CM | POA: Diagnosis not present

## 2023-07-04 DIAGNOSIS — R52 Pain, unspecified: Secondary | ICD-10-CM | POA: Diagnosis not present

## 2023-07-06 DIAGNOSIS — N186 End stage renal disease: Secondary | ICD-10-CM | POA: Diagnosis not present

## 2023-07-06 DIAGNOSIS — N2581 Secondary hyperparathyroidism of renal origin: Secondary | ICD-10-CM | POA: Diagnosis not present

## 2023-07-06 DIAGNOSIS — R52 Pain, unspecified: Secondary | ICD-10-CM | POA: Diagnosis not present

## 2023-07-06 DIAGNOSIS — D631 Anemia in chronic kidney disease: Secondary | ICD-10-CM | POA: Diagnosis not present

## 2023-07-06 DIAGNOSIS — Z992 Dependence on renal dialysis: Secondary | ICD-10-CM | POA: Diagnosis not present

## 2023-07-06 DIAGNOSIS — D689 Coagulation defect, unspecified: Secondary | ICD-10-CM | POA: Diagnosis not present

## 2023-07-06 DIAGNOSIS — D509 Iron deficiency anemia, unspecified: Secondary | ICD-10-CM | POA: Diagnosis not present

## 2023-07-07 DIAGNOSIS — M79643 Pain in unspecified hand: Secondary | ICD-10-CM | POA: Diagnosis not present

## 2023-07-07 DIAGNOSIS — Z79891 Long term (current) use of opiate analgesic: Secondary | ICD-10-CM | POA: Diagnosis not present

## 2023-07-07 DIAGNOSIS — M545 Low back pain, unspecified: Secondary | ICD-10-CM | POA: Diagnosis not present

## 2023-07-07 DIAGNOSIS — G894 Chronic pain syndrome: Secondary | ICD-10-CM | POA: Diagnosis not present

## 2023-07-07 DIAGNOSIS — G890 Central pain syndrome: Secondary | ICD-10-CM | POA: Diagnosis not present

## 2023-07-08 DIAGNOSIS — D631 Anemia in chronic kidney disease: Secondary | ICD-10-CM | POA: Diagnosis not present

## 2023-07-08 DIAGNOSIS — Z992 Dependence on renal dialysis: Secondary | ICD-10-CM | POA: Diagnosis not present

## 2023-07-08 DIAGNOSIS — R52 Pain, unspecified: Secondary | ICD-10-CM | POA: Diagnosis not present

## 2023-07-08 DIAGNOSIS — N2581 Secondary hyperparathyroidism of renal origin: Secondary | ICD-10-CM | POA: Diagnosis not present

## 2023-07-08 DIAGNOSIS — D689 Coagulation defect, unspecified: Secondary | ICD-10-CM | POA: Diagnosis not present

## 2023-07-08 DIAGNOSIS — D509 Iron deficiency anemia, unspecified: Secondary | ICD-10-CM | POA: Diagnosis not present

## 2023-07-08 DIAGNOSIS — N186 End stage renal disease: Secondary | ICD-10-CM | POA: Diagnosis not present

## 2023-07-11 ENCOUNTER — Other Ambulatory Visit: Payer: Self-pay

## 2023-07-11 DIAGNOSIS — N186 End stage renal disease: Secondary | ICD-10-CM | POA: Diagnosis not present

## 2023-07-11 DIAGNOSIS — Z992 Dependence on renal dialysis: Secondary | ICD-10-CM | POA: Diagnosis not present

## 2023-07-11 DIAGNOSIS — D631 Anemia in chronic kidney disease: Secondary | ICD-10-CM | POA: Diagnosis not present

## 2023-07-11 DIAGNOSIS — R52 Pain, unspecified: Secondary | ICD-10-CM | POA: Diagnosis not present

## 2023-07-11 DIAGNOSIS — D689 Coagulation defect, unspecified: Secondary | ICD-10-CM | POA: Diagnosis not present

## 2023-07-11 DIAGNOSIS — D509 Iron deficiency anemia, unspecified: Secondary | ICD-10-CM | POA: Diagnosis not present

## 2023-07-11 DIAGNOSIS — N2581 Secondary hyperparathyroidism of renal origin: Secondary | ICD-10-CM | POA: Diagnosis not present

## 2023-07-13 DIAGNOSIS — D509 Iron deficiency anemia, unspecified: Secondary | ICD-10-CM | POA: Diagnosis not present

## 2023-07-13 DIAGNOSIS — Z992 Dependence on renal dialysis: Secondary | ICD-10-CM | POA: Diagnosis not present

## 2023-07-13 DIAGNOSIS — D631 Anemia in chronic kidney disease: Secondary | ICD-10-CM | POA: Diagnosis not present

## 2023-07-13 DIAGNOSIS — N2581 Secondary hyperparathyroidism of renal origin: Secondary | ICD-10-CM | POA: Diagnosis not present

## 2023-07-13 DIAGNOSIS — D689 Coagulation defect, unspecified: Secondary | ICD-10-CM | POA: Diagnosis not present

## 2023-07-13 DIAGNOSIS — R52 Pain, unspecified: Secondary | ICD-10-CM | POA: Diagnosis not present

## 2023-07-13 DIAGNOSIS — N186 End stage renal disease: Secondary | ICD-10-CM | POA: Diagnosis not present

## 2023-07-15 DIAGNOSIS — N2581 Secondary hyperparathyroidism of renal origin: Secondary | ICD-10-CM | POA: Diagnosis not present

## 2023-07-15 DIAGNOSIS — D631 Anemia in chronic kidney disease: Secondary | ICD-10-CM | POA: Diagnosis not present

## 2023-07-15 DIAGNOSIS — R52 Pain, unspecified: Secondary | ICD-10-CM | POA: Diagnosis not present

## 2023-07-15 DIAGNOSIS — Z992 Dependence on renal dialysis: Secondary | ICD-10-CM | POA: Diagnosis not present

## 2023-07-15 DIAGNOSIS — D509 Iron deficiency anemia, unspecified: Secondary | ICD-10-CM | POA: Diagnosis not present

## 2023-07-15 DIAGNOSIS — D689 Coagulation defect, unspecified: Secondary | ICD-10-CM | POA: Diagnosis not present

## 2023-07-15 DIAGNOSIS — N186 End stage renal disease: Secondary | ICD-10-CM | POA: Diagnosis not present

## 2023-07-18 DIAGNOSIS — D509 Iron deficiency anemia, unspecified: Secondary | ICD-10-CM | POA: Diagnosis not present

## 2023-07-18 DIAGNOSIS — D689 Coagulation defect, unspecified: Secondary | ICD-10-CM | POA: Diagnosis not present

## 2023-07-18 DIAGNOSIS — Z992 Dependence on renal dialysis: Secondary | ICD-10-CM | POA: Diagnosis not present

## 2023-07-18 DIAGNOSIS — R52 Pain, unspecified: Secondary | ICD-10-CM | POA: Diagnosis not present

## 2023-07-18 DIAGNOSIS — D631 Anemia in chronic kidney disease: Secondary | ICD-10-CM | POA: Diagnosis not present

## 2023-07-18 DIAGNOSIS — N2581 Secondary hyperparathyroidism of renal origin: Secondary | ICD-10-CM | POA: Diagnosis not present

## 2023-07-18 DIAGNOSIS — N186 End stage renal disease: Secondary | ICD-10-CM | POA: Diagnosis not present

## 2023-07-20 DIAGNOSIS — R52 Pain, unspecified: Secondary | ICD-10-CM | POA: Diagnosis not present

## 2023-07-20 DIAGNOSIS — D631 Anemia in chronic kidney disease: Secondary | ICD-10-CM | POA: Diagnosis not present

## 2023-07-20 DIAGNOSIS — Z992 Dependence on renal dialysis: Secondary | ICD-10-CM | POA: Diagnosis not present

## 2023-07-20 DIAGNOSIS — D689 Coagulation defect, unspecified: Secondary | ICD-10-CM | POA: Diagnosis not present

## 2023-07-20 DIAGNOSIS — D509 Iron deficiency anemia, unspecified: Secondary | ICD-10-CM | POA: Diagnosis not present

## 2023-07-20 DIAGNOSIS — N186 End stage renal disease: Secondary | ICD-10-CM | POA: Diagnosis not present

## 2023-07-20 DIAGNOSIS — N2581 Secondary hyperparathyroidism of renal origin: Secondary | ICD-10-CM | POA: Diagnosis not present

## 2023-07-21 DIAGNOSIS — Z992 Dependence on renal dialysis: Secondary | ICD-10-CM | POA: Diagnosis not present

## 2023-07-21 DIAGNOSIS — E1122 Type 2 diabetes mellitus with diabetic chronic kidney disease: Secondary | ICD-10-CM | POA: Diagnosis not present

## 2023-07-21 DIAGNOSIS — N186 End stage renal disease: Secondary | ICD-10-CM | POA: Diagnosis not present

## 2023-07-22 DIAGNOSIS — R52 Pain, unspecified: Secondary | ICD-10-CM | POA: Diagnosis not present

## 2023-07-22 DIAGNOSIS — N2581 Secondary hyperparathyroidism of renal origin: Secondary | ICD-10-CM | POA: Diagnosis not present

## 2023-07-22 DIAGNOSIS — T7840XA Allergy, unspecified, initial encounter: Secondary | ICD-10-CM | POA: Diagnosis not present

## 2023-07-22 DIAGNOSIS — Z992 Dependence on renal dialysis: Secondary | ICD-10-CM | POA: Diagnosis not present

## 2023-07-22 DIAGNOSIS — L299 Pruritus, unspecified: Secondary | ICD-10-CM | POA: Diagnosis not present

## 2023-07-22 DIAGNOSIS — D631 Anemia in chronic kidney disease: Secondary | ICD-10-CM | POA: Diagnosis not present

## 2023-07-22 DIAGNOSIS — N186 End stage renal disease: Secondary | ICD-10-CM | POA: Diagnosis not present

## 2023-07-22 DIAGNOSIS — D509 Iron deficiency anemia, unspecified: Secondary | ICD-10-CM | POA: Diagnosis not present

## 2023-07-22 DIAGNOSIS — D689 Coagulation defect, unspecified: Secondary | ICD-10-CM | POA: Diagnosis not present

## 2023-07-25 DIAGNOSIS — N2581 Secondary hyperparathyroidism of renal origin: Secondary | ICD-10-CM | POA: Diagnosis not present

## 2023-07-25 DIAGNOSIS — D689 Coagulation defect, unspecified: Secondary | ICD-10-CM | POA: Diagnosis not present

## 2023-07-25 DIAGNOSIS — L299 Pruritus, unspecified: Secondary | ICD-10-CM | POA: Diagnosis not present

## 2023-07-25 DIAGNOSIS — R52 Pain, unspecified: Secondary | ICD-10-CM | POA: Diagnosis not present

## 2023-07-25 DIAGNOSIS — D509 Iron deficiency anemia, unspecified: Secondary | ICD-10-CM | POA: Diagnosis not present

## 2023-07-25 DIAGNOSIS — Z992 Dependence on renal dialysis: Secondary | ICD-10-CM | POA: Diagnosis not present

## 2023-07-25 DIAGNOSIS — N186 End stage renal disease: Secondary | ICD-10-CM | POA: Diagnosis not present

## 2023-07-25 DIAGNOSIS — T7840XA Allergy, unspecified, initial encounter: Secondary | ICD-10-CM | POA: Diagnosis not present

## 2023-07-25 DIAGNOSIS — D631 Anemia in chronic kidney disease: Secondary | ICD-10-CM | POA: Diagnosis not present

## 2023-07-26 ENCOUNTER — Ambulatory Visit (INDEPENDENT_AMBULATORY_CARE_PROVIDER_SITE_OTHER): Payer: 59 | Admitting: Family

## 2023-07-26 ENCOUNTER — Encounter: Payer: Self-pay | Admitting: Family

## 2023-07-26 DIAGNOSIS — G894 Chronic pain syndrome: Secondary | ICD-10-CM

## 2023-07-26 DIAGNOSIS — M1711 Unilateral primary osteoarthritis, right knee: Secondary | ICD-10-CM

## 2023-07-26 MED ORDER — LIDOCAINE HCL 1 % IJ SOLN
5.0000 mL | INTRAMUSCULAR | Status: AC | PRN
  Administered 2023-07-26: 5 mL

## 2023-07-26 MED ORDER — METHYLPREDNISOLONE ACETATE 40 MG/ML IJ SUSP
40.0000 mg | INTRAMUSCULAR | Status: AC | PRN
  Administered 2023-07-26: 40 mg via INTRA_ARTICULAR

## 2023-07-26 NOTE — Progress Notes (Signed)
 Office Visit Note   Patient: [name] [name]           Date of Birth: 1975-10-12           MRN: [id number] Visit Date: 07/26/2023              Requested by: Morrie Sheldon, MD [address] [place],  [place] [address] PCP: Morrie Sheldon, MD  Chief Complaint  Patient presents with  . Right Knee - Pain      HPI: The patient is a 48 year old woman comes today for concern of chronic right knee pain she complains of giving way occasional catching grinding pain.  She has had Depo-Medrol injections in the past unfortunately these have only provided 2 to 3 weeks of relief.  Radiographs of the right knee from 2019 showed lateral worse than medial compartment narrowing with severe patellofemoral degenerative changes, osteoarthritis.  Assessment & Plan: Visit Diagnoses: No diagnosis found.  Plan: Will submit for prior authorization for supplemental injection right knee.  Depo-Medrol injection right knee today.  Patient tolerated well.  Follow-Up Instructions: No follow-ups on file.   Right Knee Exam   Tenderness  The patient is experiencing tenderness in the medial joint line and lateral joint line.  Range of Motion  The patient has normal right knee ROM.  Tests  Varus: negative Valgus: negative  Other  Erythema: absent Effusion: no effusion present     Patient is alert, oriented, no adenopathy, well-dressed, normal affect, normal respiratory effort.   Imaging: No results found. No images are attached to the encounter.  Labs: Lab Results  Component Value Date   HGBA1C 4.4 05/10/2023   HGBA1C 6.6 (A) 11/14/2022   HGBA1C 8.0 (A) 07/20/2022   ESRSEDRATE 130 (H) 03/24/2017   ESRSEDRATE 53 (H) 09/14/2015   ESRSEDRATE 132 (H) 07/06/2012   CRP 13.6 (H) 02/14/2019   CRP 38.6 (H) 04/03/2018   CRP 5.4 (H) 09/14/2015   LABURIC 8.3 (H) 04/15/2019   REPTSTATUS 09/16/2021 FINAL 09/15/2021   GRAMSTAIN  09/04/2019    FEW WBC PRESENT, PREDOMINANTLY PMN MODERATE GRAM  POSITIVE COCCI    CULT (A) 09/15/2021    10,000 COLONIES/mL GROUP B STREP(S.AGALACTIAE)ISOLATED TESTING AGAINST S. AGALACTIAE NOT ROUTINELY PERFORMED DUE TO PREDICTABILITY OF AMP/PEN/VAN SUSCEPTIBILITY. Performed at [facility], [address], [place], [place] [address]    [facility] STAPHYLOCOCCUS AUREUS 09/04/2019     Lab Results  Component Value Date   ALBUMIN 3.3 (L) 11/02/2022   ALBUMIN 3.9 11/01/2022   ALBUMIN 3.9 11/01/2022    Lab Results  Component Value Date   MG 2.2 01/12/2021   MG 2.0 01/07/2021   MG 2.3 04/07/2018   Lab Results  Component Value Date   VD25OH 4.45 (L) 10/31/2022    No results found for: "PREALBUMIN"    Latest Ref Rng & Units 11/01/2022   11:49 AM 11/01/2022    3:58 AM 10/30/2022   12:04 PM  CBC EXTENDED  WBC 4.0 - 10.5 K/uL 7.9  8.3  6.4   RBC 3.87 - 5.11 MIL/uL 4.44  4.22  4.60   Hemoglobin 12.0 - 15.0 g/dL 82.9  56.2  13.0   HCT 36.0 - 46.0 % 38.6  36.8  40.9   Platelets 150 - 400 K/uL 219  213  166   NEUT# 1.7 - 7.7 K/uL 5.4   4.7   Lymph# 0.7 - 4.0 K/uL 1.6   1.2      There is no height or weight on  file to calculate BMI.  Orders:  No orders of the defined types were placed in this encounter.  No orders of the defined types were placed in this encounter.    Procedures: Large Joint Inj: R knee on 07/26/2023 1:23 PM Indications: pain Details: 18 G 1.5 in needle, anteromedial approach Medications: 5 mL lidocaine 1 %; 40 mg methylPREDNISolone acetate 40 MG/ML Consent was given by the patient.     Clinical Data: No additional findings.  ROS:  All other systems negative, except as noted in the HPI. Review of Systems  Objective: Vital Signs: There were no vitals taken for this visit.  Specialty Comments:  No specialty comments available.  PMFS History: Patient Active Problem List   Diagnosis Date Noted  . CAD (coronary artery disease) 10/31/2022  . Trichomonas infection 09/06/2022  . Chronic pain syndrome  01/16/2020  . Hx of AKA (above knee amputation), left (HCC)   . Tobacco use 03/01/2019  . Phantom limb pain (HCC) 11/08/2018  . Anemia of chronic disease   . Heart failure with preserved ejection fraction (HCC) 02/09/2017  . Healthcare maintenance 12/08/2016  . ESRD (end stage renal disease) (HCC) 07/15/2016  . OSA (obstructive sleep apnea) 05/13/2016  . Morbid obesity due to excess calories (HCC) 11/27/2015  . Subclinical hypothyroidism 04/25/2013  . Diabetes mellitus type 2 with peripheral artery disease (HCC) 07/12/2012  . Diabetic polyneuropathy associated with type 2 diabetes mellitus (HCC) 07/04/2011  . Anxiety and depression 06/02/2011  . Hyperlipidemia 08/29/2007  . Type 2 DM with hypertension and ESRD on dialysis (HCC) 08/29/2007   Past Medical History:  Diagnosis Date  . Abdominal muscle pain 09/08/2016  . Abnormal Pap smear of cervix 2009  . Abscess    history of multiple abscesses  . Acute bilateral low back pain 02/13/2017  . Acute blood loss anemia    Hx  Iron infusions  . Acute on chronic renal failure (HCC) 07/12/2012  . Acute renal failure (HCC) 07/12/2012  . Acute respiratory failure with hypoxia (HCC) 10/21/2019  . Adjustment disorder with depressed mood 03/26/2017  . AKI (acute kidney injury) (HCC)    Dialysis t-th-s  . Anemia 10/21/2019  . Anemia of chronic disease 2002  . Anginal pain (HCC)   . Anxiety    Panic attacks  . Bilateral lower extremity edema 05/13/2016  . Bipolar disorder (HCC)   . Carpal tunnel syndrome, bilateral 01/25/2012  . Cataract    B/L cataract  . Cellulitis 05/21/2014   right eye  . CHF (congestive heart failure) (HCC)   . Chronic bronchitis (HCC)    "get it q yr" (05/13/2013)  . Chronic pain   . Chronic pain of right knee 09/08/2016  . Crutches as ambulation aid    uses forearm crutchs, Left  AKA with prosthetic.  Marland Kitchen Dehiscence of amputation stump (HCC)   . Depression   . Edema of lower extremity   . Endocarditis 2002    subacute bacterial endocarditis.   . Fall   . Family history of anesthesia complication    "my mom has a hard time coming out from under"  . Fibromyalgia   . GERD (gastroesophageal reflux disease)    occ  . Heart murmur    never has had any problems  . Herpes simplex type 1 infection 01/16/2018  . Hidradenitis suppurativa 02/24/2021  . History of blood transfusion    "just low blood count" (05/13/2013)  . History of sexually transmitted infection   . Hyperlipidemia   .  Hypertension   . Hypothyroidism   . Hypothyroidism, adult 03/21/2014  . Hypoxia   . Leukocytosis   . Muscle spasm 12/16/2020  . Necrosis (HCC)    and ulceration  . Necrotizing fasciitis s/p OR debridements 07/06/2012  . Obesity   . OSA on CPAP    does not use CPAP  . Peripheral neuropathy   . Pneumonia   . Pyelonephritis 12/31/2020  . Routine adult health maintenance 12/08/2016  . Severe protein-calorie malnutrition (HCC)   . Status post below knee amputation, left (HCC) 04/11/2017  . Type II diabetes mellitus (HCC)    Type  II  . Vaginal irritation 03/14/2016  . Weak pulse 07/21/2021  . Wound dehiscence 09/04/2019    Family History  Problem Relation Age of Onset  . Heart failure Mother   . Diabetes Mother   . Kidney disease Mother   . Kidney disease Father   . Diabetes Father   . Diabetes Paternal Grandmother   . Heart failure Paternal Grandmother   . Other Other     Past Surgical History:  Procedure Laterality Date  . A/V FISTULAGRAM Left 05/12/2021   Procedure: A/V FISTULAGRAM;  Surgeon: Victorino Sparrow, MD;  Location: [facility] INVASIVE CV LAB;  Service: Cardiovascular;  Laterality: Left;  . ABDOMINAL AORTOGRAM W/LOWER EXTREMITY Left 03/19/2019   Procedure: ABDOMINAL AORTOGRAM W/LOWER EXTREMITY;  Surgeon: Nada Libman, MD;  Location: [facility] INVASIVE CV LAB;  Service: Cardiovascular;  Laterality: Left;  . ABOVE KNEE LEG AMPUTATION Left 10/11/2019   wound dehisence   . AIR/FLUID EXCHANGE Right  12/31/2019   Procedure: AIR/FLUID EXCHANGE;  Surgeon: Carmela Rima, MD;  Location: [facility] OR;  Service: Ophthalmology;  Laterality: Right;  . AMPUTATION Left 04/07/2017   Procedure: LEFT BELOW KNEE AMPUTATION;  Surgeon: Nadara Mustard, MD;  Location[facility] OR;  Service: Orthopedics;  Laterality: Left;  . AMPUTATION Left 10/11/2019   Procedure: LEFT ABOVE KNEE AMPUTATION;  Surgeon: Nadara Mustard, MD;  Location: [facility] OR;  Service: Orthopedics;  Laterality: Left;  . AV FISTULA PLACEMENT Left 01/05/2021   Procedure: ARTERIOVENOUS (AV) FISTULA CREATION vs. GRAFT;  Surgeon: Chuck Hint, MD;  Location: [facility] OR;  Service: Vascular;  Laterality: Left;  . CATARACT EXTRACTION W/PHACO Right 02/11/2020   Procedure: CATARACT EXTRACTION PHACO AND INTRAOCULAR LENS PLACEMENT (IOC);  Surgeon: Carmela Rima, MD;  Location: [facility] OR;  Service: Ophthalmology;  Laterality: Right;  . EYE SURGERY     lazer  . FISTULA SUPERFICIALIZATION Left 02/23/2021   Procedure: SUPERFICIALIZATION OF LEFT BRACHIOCEPHALIC FISTULA;  Surgeon: Chuck Hint, MD;  Location: [facility] OR;  Service: Vascular;  Laterality: Left;  Marland Kitchen GAS INSERTION Right 12/31/2019   Procedure: INSERTION OF GAS;  Surgeon: Carmela Rima, MD;  Location: [facility] OR;  Service: Ophthalmology;  Laterality: Right;  . INCISION AND DRAINAGE ABSCESS     multiple I&Ds  . INCISION AND DRAINAGE ABSCESS Left 07/09/2012   Procedure: DRESSING CHANGE, THIGH WOUND;  Surgeon: Shelly Rubenstein, MD;  Location: [facility] OR;  Service: General;  Laterality: Left;  . INCISION AND DRAINAGE OF WOUND Left 07/07/2012   Procedure: IRRIGATION AND DEBRIDEMENT WOUND;  Surgeon: Shelly Rubenstein, MD;  Location: [facility] OR;  Service: General;  Laterality: Left;  . INCISION AND DRAINAGE PERIRECTAL ABSCESS Left 07/14/2012   Procedure: DEBRIDEMENT OF SKIN & SOFT TISSUE; DRESSING CHANGE UNDER ANESTHESIA;  Surgeon: Atilano Ina, MD,FACS;  Location: [facility] OR;  Service: General;  Laterality: Left;  .  INCISION AND DRAINAGE PERIRECTAL ABSCESS Left  07/16/2012   Procedure: I&D Left Thigh;  Surgeon: Cherylynn Ridges, MD;  Location: [facility] OR;  Service: General;  Laterality: Left;  . INCISION AND DRAINAGE PERIRECTAL ABSCESS N/A 01/05/2015   Procedure: IRRIGATION AND DEBRIDEMENT PERIRECTAL ABSCESS;  Surgeon: Manus Rudd, MD;  Location: [facility] OR;  Service: General;  Laterality: N/A;  . INSERTION OF DIALYSIS CATHETER Right 01/05/2021   Procedure: INSERTION OF DIALYSIS CATHETER;  Surgeon: Chuck Hint, MD;  Location: [facility] OR;  Service: Vascular;  Laterality: Right;  . IRRIGATION AND DEBRIDEMENT ABSCESS Left 07/06/2012   Procedure: IRRIGATION AND DEBRIDEMENT ABSCESS BUTTOCKS AND THIGH;  Surgeon: Kandis Cocking, MD;  Location: [facility] OR;  Service: General;  Laterality: Left;  . IRRIGATION AND DEBRIDEMENT ABSCESS Left 08/10/2012   Procedure: IRRIGATION AND DEBRIDEMENT ABSCESS;  Surgeon: Lodema Pilot, DO;  Location: [facility] OR;  Service: General;  Laterality: Left;  . LEFT HEART CATH AND CORONARY ANGIOGRAPHY N/A 10/31/2022   Procedure: LEFT HEART CATH AND CORONARY ANGIOGRAPHY;  Surgeon: Lennette Bihari, MD;  Location: [facility] INVASIVE CV LAB;  Service: Cardiovascular;  Laterality: N/A;  . MEMBRANE PEEL Left 07/24/2022   Procedure: MEMBRANE PEEL;  Surgeon: Carmela Rima, MD;  Location: [facility] OR;  Service: Ophthalmology;  Laterality: Left;  . PARS PLANA VITRECTOMY Right 12/31/2019   Procedure: PARS PLANA VITRECTOMY WITH 25 GAUGE;  Surgeon: Carmela Rima, MD;  Location: [facility] OR;  Service: Ophthalmology;  Laterality: Right;  . PARS PLANA VITRECTOMY Left 07/24/2022   Procedure: PARS PLANA VITRECTOMY WITH 25 GAUGE;  Surgeon: Carmela Rima, MD;  Location: [facility] OR;  Service: Ophthalmology;  Laterality: Left;  . PERIPHERAL VASCULAR INTERVENTION Left 05/12/2021   Procedure: PERIPHERAL VASCULAR INTERVENTION;  Surgeon: Victorino Sparrow, MD;  Location: [facility] INVASIVE CV LAB;  Service: Cardiovascular;  Laterality: Left;  arm fistula  .  PHOTOCOAGULATION Left 07/24/2022   Procedure: AIRGAS, SILICONE OIL, PHOTOCOAGULATION;  Surgeon: Carmela Rima, MD;  Location: [facility] OR;  Service: Ophthalmology;  Laterality: Left;  . PHOTOCOAGULATION WITH LASER Right 12/31/2019   Procedure: PHOTOCOAGULATION WITH LASER;  Surgeon: Carmela Rima, MD;  Location: [facility] OR;  Service: Ophthalmology;  Laterality: Right;  . REPAIR OF COMPLEX TRACTION RETINAL DETACHMENT Right 12/31/2019   Procedure: REPAIR OF COMPLEX TRACTION RETINAL;  Surgeon: Carmela Rima, MD;  Location: [facility] OR;  Service: Ophthalmology;  Laterality: Right;  . REPAIR OF COMPLEX TRACTION RETINAL DETACHMENT Left 07/24/2022   Procedure: REPAIR OF COMPLEX TRACTION RETINAL DETACHMENT;  Surgeon: Carmela Rima, MD;  Location: [facility] OR;  Service: Ophthalmology;  Laterality: Left;  . STUMP REVISION Left 06/09/2017   Procedure: REVISION LEFT BELOW KNEE AMPUTATION;  Surgeon: Nadara Mustard, MD;  Location: [facility] OR;  Service: Orthopedics;  Laterality: Left;  . STUMP REVISION Left 09/04/2019   Procedure: LEFT BELOW KNEE AMPUTATION REVISION;  Surgeon: Nadara Mustard, MD;  Location: [facility] OR;  Service: Orthopedics;  Laterality: Left;  . STUMP REVISION Left 09/20/2019   Procedure: LEFT BELOW KNEE AMPUTATION REVISION;  Surgeon: Nadara Mustard, MD;  Location: [facility] OR;  Service: Orthopedics;  Laterality: Left;  . TONSILLECTOMY  1994   Social History   Occupational History  . Occupation: disability  Tobacco Use  . Smoking status: Former    Current packs/day: 0.00    Average packs/day: 0.5 packs/day for 16.0 years (8.0 ttl pk-yrs)    Types: Cigarettes, E-cigarettes    Start date: 08/2005    Quit date: 08/2021    Years since quitting: 1.9  . Smokeless tobacco: Never  Vaping Use  .  Vaping status: Some Days  . Substances: Nicotine, THC, CBD, Flavoring  . Devices: CBD oil  Substance and Sexual Activity  . Alcohol use: Yes    Alcohol/week: 0.0 standard drinks of alcohol    Comment: social  . Drug use: Not  Currently    Types: Marijuana  . Sexual activity: Yes    Birth control/protection: Condom

## 2023-07-27 DIAGNOSIS — N2581 Secondary hyperparathyroidism of renal origin: Secondary | ICD-10-CM | POA: Diagnosis not present

## 2023-07-27 DIAGNOSIS — N186 End stage renal disease: Secondary | ICD-10-CM | POA: Diagnosis not present

## 2023-07-27 DIAGNOSIS — D689 Coagulation defect, unspecified: Secondary | ICD-10-CM | POA: Diagnosis not present

## 2023-07-27 DIAGNOSIS — Z992 Dependence on renal dialysis: Secondary | ICD-10-CM | POA: Diagnosis not present

## 2023-07-27 DIAGNOSIS — T7840XA Allergy, unspecified, initial encounter: Secondary | ICD-10-CM | POA: Diagnosis not present

## 2023-07-27 DIAGNOSIS — D631 Anemia in chronic kidney disease: Secondary | ICD-10-CM | POA: Diagnosis not present

## 2023-07-27 DIAGNOSIS — L299 Pruritus, unspecified: Secondary | ICD-10-CM | POA: Diagnosis not present

## 2023-07-27 DIAGNOSIS — R52 Pain, unspecified: Secondary | ICD-10-CM | POA: Diagnosis not present

## 2023-07-27 DIAGNOSIS — D509 Iron deficiency anemia, unspecified: Secondary | ICD-10-CM | POA: Diagnosis not present

## 2023-07-29 DIAGNOSIS — D631 Anemia in chronic kidney disease: Secondary | ICD-10-CM | POA: Diagnosis not present

## 2023-07-29 DIAGNOSIS — T7840XA Allergy, unspecified, initial encounter: Secondary | ICD-10-CM | POA: Diagnosis not present

## 2023-07-29 DIAGNOSIS — D509 Iron deficiency anemia, unspecified: Secondary | ICD-10-CM | POA: Diagnosis not present

## 2023-07-29 DIAGNOSIS — Z992 Dependence on renal dialysis: Secondary | ICD-10-CM | POA: Diagnosis not present

## 2023-07-29 DIAGNOSIS — L299 Pruritus, unspecified: Secondary | ICD-10-CM | POA: Diagnosis not present

## 2023-07-29 DIAGNOSIS — R52 Pain, unspecified: Secondary | ICD-10-CM | POA: Diagnosis not present

## 2023-07-29 DIAGNOSIS — N186 End stage renal disease: Secondary | ICD-10-CM | POA: Diagnosis not present

## 2023-07-29 DIAGNOSIS — N2581 Secondary hyperparathyroidism of renal origin: Secondary | ICD-10-CM | POA: Diagnosis not present

## 2023-07-29 DIAGNOSIS — D689 Coagulation defect, unspecified: Secondary | ICD-10-CM | POA: Diagnosis not present

## 2023-07-31 DIAGNOSIS — Z89612 Acquired absence of left leg above knee: Secondary | ICD-10-CM | POA: Diagnosis not present

## 2023-08-01 DIAGNOSIS — T7840XA Allergy, unspecified, initial encounter: Secondary | ICD-10-CM | POA: Diagnosis not present

## 2023-08-01 DIAGNOSIS — N2581 Secondary hyperparathyroidism of renal origin: Secondary | ICD-10-CM | POA: Diagnosis not present

## 2023-08-01 DIAGNOSIS — N186 End stage renal disease: Secondary | ICD-10-CM | POA: Diagnosis not present

## 2023-08-01 DIAGNOSIS — L299 Pruritus, unspecified: Secondary | ICD-10-CM | POA: Diagnosis not present

## 2023-08-01 DIAGNOSIS — D509 Iron deficiency anemia, unspecified: Secondary | ICD-10-CM | POA: Diagnosis not present

## 2023-08-01 DIAGNOSIS — D689 Coagulation defect, unspecified: Secondary | ICD-10-CM | POA: Diagnosis not present

## 2023-08-01 DIAGNOSIS — D631 Anemia in chronic kidney disease: Secondary | ICD-10-CM | POA: Diagnosis not present

## 2023-08-01 DIAGNOSIS — Z992 Dependence on renal dialysis: Secondary | ICD-10-CM | POA: Diagnosis not present

## 2023-08-01 DIAGNOSIS — R52 Pain, unspecified: Secondary | ICD-10-CM | POA: Diagnosis not present

## 2023-08-02 ENCOUNTER — Encounter: Payer: Self-pay | Admitting: Student

## 2023-08-02 ENCOUNTER — Ambulatory Visit: Admitting: Student

## 2023-08-02 ENCOUNTER — Encounter (HOSPITAL_COMMUNITY): Payer: Self-pay

## 2023-08-02 ENCOUNTER — Ambulatory Visit (HOSPITAL_COMMUNITY): Admission: EM | Admit: 2023-08-02 | Discharge: 2023-08-02 | Disposition: A | Discharge status: Home / Self Care

## 2023-08-02 ENCOUNTER — Other Ambulatory Visit: Payer: Self-pay

## 2023-08-02 VITALS — BP 117/71 | HR 72 | Temp 97.9°F | Ht 69.0 in | Wt 232.3 lb

## 2023-08-02 DIAGNOSIS — T171XXA Foreign body in nostril, initial encounter: Secondary | ICD-10-CM | POA: Diagnosis not present

## 2023-08-02 DIAGNOSIS — Z89612 Acquired absence of left leg above knee: Secondary | ICD-10-CM

## 2023-08-02 NOTE — Assessment & Plan Note (Signed)
 Patient with left AKA, uses wheelchair for easier ambulation throughout the house that sometimes when she is out and about.  Her last new oen was 6 years ago, she would benefit from a new standardized wheelchair.

## 2023-08-02 NOTE — ED Provider Notes (Signed)
 [facility] [place]  Note:  This document was prepared using Dragon voice recognition software and may include unintentional dictation errors.  MRN: [id number] DOB: [birth year]  Subjective:   [name] [name] is a 48 y.o. female presenting for embedded nose ring in right nare.  Patient reports that she tried to remove herself but just made the problem worse.  Patient is here for nose ring removal.  No bleeding, no sign of infection, no redness no warmth no purulent discharge.  Patient denies any other medical concern.  No current facility-administered medications for this encounter.  Current Outpatient Medications:    allopurinol (ZYLOPRIM) 100 MG tablet, Take 1 tablet by mouth 3 times a week. Take only after dialysis sessions. Do not take on non-dialysis days., Disp: 30 tablet, Rfl: 1   amLODipine (NORVASC) 10 MG tablet, Take 1 tablet (10 mg total) by mouth daily., Disp: 90 tablet, Rfl: 1   aspirin 81 MG chewable tablet, Chew 1 tablet (81 mg total) by mouth daily., Disp: 90 tablet, Rfl: 0   atorvastatin (LIPITOR) 80 MG tablet, Take 1 tablet (80 mg total) by mouth daily., Disp: 30 tablet, Rfl: 0   Benzoyl Peroxide 2.5 % CREA, Apply 1 drop topically daily., Disp: 21 g, Rfl: 0   blood glucose meter kit and supplies KIT, ICD 10- E11.69. Based on patient and insurance preference. Use up to four times daily as directed., Disp: 1 each, Rfl: 0   buPROPion (WELLBUTRIN) 100 MG tablet, Take 1 tablet (100 mg total) by mouth every other day., Disp: 30 tablet, Rfl: 1   furosemide (LASIX) 20 MG tablet, Take 1 tablet by mouth daily. Take on non-dialysis days. Adjust per kidney docs (Patient taking differently: Take 20 mg by mouth daily. Take on non-dialysis days. Adjust per kidney docs), Disp: 90 tablet, Rfl: 1   hydrOXYzine (ATARAX) 25 MG tablet, Take 1 tablet (25 mg total) by mouth 3 (three) times daily as needed for anxiety., Disp: 90 tablet, Rfl: 0   ketoconazole (NIZORAL) 2 % cream, Apply 1  application topically to affected area daily., Disp: 15 g, Rfl: 2   levothyroxine (SYNTHROID) 88 MCG tablet, Take 1 tablet (88 mcg total) by mouth daily., Disp: , Rfl:    metoprolol succinate (TOPROL-XL) 25 MG 24 hr tablet, Take 1 tablet (25 mg total) by mouth daily., Disp: 90 tablet, Rfl: 1   tirzepatide (MOUNJARO) 10 MG/0.5ML Pen, Inject 10 mg into the skin once a week., Disp: 2 mL, Rfl: 0   tretinoin (RETIN-A) 0.025 % cream, Apply topically to affected area at bedtime, Disp: 45 g, Rfl: 0   Vitamin D, Ergocalciferol, (DRISDOL) 1.25 MG (50000 UNIT) CAPS capsule, Take 1 capsule (50,000 Units total) by mouth every 7 (seven) days., Disp: 9 capsule, Rfl: 0   diclofenac Sodium (VOLTAREN) 1 % GEL, APPLY 1 GRAM TOPICALLY TO AFFECTED AREA 4 TIMES DAILY AS NEEDED (Patient taking differently: Apply 1 g topically 4 (four) times daily as needed (for pain).), Disp: 100 g, Rfl: 2   fluticasone (FLONASE) 50 MCG/ACT nasal spray, Place 1 spray into both nostrils daily. (Patient taking differently: Place 1 spray into both nostrils daily as needed for allergies.), Disp: 11.1 mL, Rfl: 2   Insulin Pen Needle (TRUEPLUS PEN NEEDLES) 31G X 5 MM MISC, 1 each by Other route 3 (three) times daily., Disp: 100 each, Rfl: 5   ofloxacin (OCUFLOX) 0.3 % ophthalmic solution, Place 1 drop into the left eye in the morning and at bedtime., Disp: , Rfl:  oxyCODONE-acetaminophen (PERCOCET) 7.5-325 MG tablet, Take 1 tablet by mouth every 8 (eight) hours as needed for moderate pain., Disp: , Rfl:    sevelamer carbonate (RENVELA) 800 MG tablet, Take 6 to 7 tablets by mouth twice a day with meals, Disp: , Rfl:    Allergies  Allergen Reactions   Vancomycin Other (See Comments)    Acute renal failure suspected secondary to vanco    Past Medical History:  Diagnosis Date   Abdominal muscle pain 09/08/2016   Abnormal Pap smear of cervix 2009   Abscess    history of multiple abscesses   Acute bilateral low back pain 02/13/2017   Acute  blood loss anemia    Hx  Iron infusions   Acute on chronic renal failure (HCC) 07/12/2012   Acute renal failure (HCC) 07/12/2012   Acute respiratory failure with hypoxia (HCC) 10/21/2019   Adjustment disorder with depressed mood 03/26/2017   AKI (acute kidney injury) (HCC)    Dialysis t-th-s   Anemia 10/21/2019   Anemia of chronic disease 2002   Anginal pain (HCC)    Anxiety    Panic attacks   Bilateral lower extremity edema 05/13/2016   Bipolar disorder (HCC)    Carpal tunnel syndrome, bilateral 01/25/2012   Cataract    B/L cataract   Cellulitis 05/21/2014   right eye   CHF (congestive heart failure) (HCC)    Chronic bronchitis (HCC)    "get it q yr" (05/13/2013)   Chronic pain    Chronic pain of right knee 09/08/2016   Crutches as ambulation aid    uses forearm crutchs, Left  AKA with prosthetic.   Dehiscence of amputation stump (HCC)    Depression    Edema of lower extremity    Endocarditis 2002   subacute bacterial endocarditis.    Fall    Family history of anesthesia complication    "my mom has a hard time coming out from under"   Fibromyalgia    GERD (gastroesophageal reflux disease)    occ   Heart murmur    never has had any problems   Herpes simplex type 1 infection 01/16/2018   Hidradenitis suppurativa 02/24/2021   History of blood transfusion    "just low blood count" (05/13/2013)   History of sexually transmitted infection    Hyperlipidemia    Hypertension    Hypothyroidism    Hypothyroidism, adult 03/21/2014   Hypoxia    Leukocytosis    Muscle spasm 12/16/2020   Necrosis (HCC)    and ulceration   Necrotizing fasciitis s/p OR debridements 07/06/2012   Obesity    OSA on CPAP    does not use CPAP   Peripheral neuropathy    Pneumonia    Pyelonephritis 12/31/2020   Routine adult health maintenance 12/08/2016   Severe protein-calorie malnutrition (HCC)    Status post below knee amputation, left (HCC) 04/11/2017   Type II diabetes mellitus (HCC)     Type  II   Vaginal irritation 03/14/2016   Weak pulse 07/21/2021   Wound dehiscence 09/04/2019     Past Surgical History:  Procedure Laterality Date   A/V FISTULAGRAM Left 05/12/2021   Procedure: A/V FISTULAGRAM;  Surgeon: Victorino Sparrow, MD;  Location: [facility] INVASIVE CV LAB;  Service: Cardiovascular;  Laterality: Left;   ABDOMINAL AORTOGRAM W/LOWER EXTREMITY Left 03/19/2019   Procedure: ABDOMINAL AORTOGRAM W/LOWER EXTREMITY;  Surgeon: Nada Libman, MD;  Location: [facility] INVASIVE CV LAB;  Service: Cardiovascular;  Laterality: Left;   ABOVE  KNEE LEG AMPUTATION Left 10/11/2019   wound dehisence    AIR/FLUID EXCHANGE Right 12/31/2019   Procedure: AIR/FLUID EXCHANGE;  Surgeon: Carmela Rima, MD;  Location: [facility] OR;  Service: Ophthalmology;  Laterality: Right;   AMPUTATION Left 04/07/2017   Procedure: LEFT BELOW KNEE AMPUTATION;  Surgeon: Nadara Mustard, MD;  Location: [facility] OR;  Service: Orthopedics;  Laterality: Left;   AMPUTATION Left 10/11/2019   Procedure: LEFT ABOVE KNEE AMPUTATION;  Surgeon: Nadara Mustard, MD;  Location: [facility] OR;  Service: Orthopedics;  Laterality: Left;   AV FISTULA PLACEMENT Left 01/05/2021   Procedure: ARTERIOVENOUS (AV) FISTULA CREATION vs. GRAFT;  Surgeon: Chuck Hint, MD;  Location: [facility] OR;  Service: Vascular;  Laterality: Left;   CATARACT EXTRACTION W/PHACO Right 02/11/2020   Procedure: CATARACT EXTRACTION PHACO AND INTRAOCULAR LENS PLACEMENT (IOC);  Surgeon: Carmela Rima, MD;  Location: [facility] OR;  Service: Ophthalmology;  Laterality: Right;   EYE SURGERY     lazer   FISTULA SUPERFICIALIZATION Left 02/23/2021   Procedure: SUPERFICIALIZATION OF LEFT BRACHIOCEPHALIC FISTULA;  Surgeon: Chuck Hint, MD;  Location: [facility] OR;  Service: Vascular;  Laterality: Left;   GAS INSERTION Right 12/31/2019   Procedure: INSERTION OF GAS;  Surgeon: Carmela Rima, MD;  Location: [facility] OR;  Service: Ophthalmology;  Laterality: Right;   INCISION AND DRAINAGE ABSCESS      multiple I&Ds   INCISION AND DRAINAGE ABSCESS Left 07/09/2012   Procedure: DRESSING CHANGE, THIGH WOUND;  Surgeon: Shelly Rubenstein, MD;  Location: [facility] OR;  Service: General;  Laterality: Left;   INCISION AND DRAINAGE OF WOUND Left 07/07/2012   Procedure: IRRIGATION AND DEBRIDEMENT WOUND;  Surgeon: Shelly Rubenstein, MD;  Location: [facility] OR;  Service: General;  Laterality: Left;   INCISION AND DRAINAGE PERIRECTAL ABSCESS Left 07/14/2012   Procedure: DEBRIDEMENT OF SKIN & SOFT TISSUE; DRESSING CHANGE UNDER ANESTHESIA;  Surgeon: Atilano Ina, MD,FACS;  Location: [facility] OR;  Service: General;  Laterality: Left;   INCISION AND DRAINAGE PERIRECTAL ABSCESS Left 07/16/2012   Procedure: I&D Left Thigh;  Surgeon: Cherylynn Ridges, MD;  Location: [facility] OR;  Service: General;  Laterality: Left;   INCISION AND DRAINAGE PERIRECTAL ABSCESS N/A 01/05/2015   Procedure: IRRIGATION AND DEBRIDEMENT PERIRECTAL ABSCESS;  Surgeon: Manus Rudd, MD;  Location: [facility] OR;  Service: General;  Laterality: N/A;   INSERTION OF DIALYSIS CATHETER Right 01/05/2021   Procedure: INSERTION OF DIALYSIS CATHETER;  Surgeon: Chuck Hint, MD;  Location: [facility] OR;  Service: Vascular;  Laterality: Right;   IRRIGATION AND DEBRIDEMENT ABSCESS Left 07/06/2012   Procedure: IRRIGATION AND DEBRIDEMENT ABSCESS BUTTOCKS AND THIGH;  Surgeon: Kandis Cocking, MD;  Location: [facility] OR;  Service: General;  Laterality: Left;   IRRIGATION AND DEBRIDEMENT ABSCESS Left 08/10/2012   Procedure: IRRIGATION AND DEBRIDEMENT ABSCESS;  Surgeon: Lodema Pilot, DO;  Location: [facility] OR;  Service: General;  Laterality: Left;   LEFT HEART CATH AND CORONARY ANGIOGRAPHY N/A 10/31/2022   Procedure: LEFT HEART CATH AND CORONARY ANGIOGRAPHY;  Surgeon: Lennette Bihari, MD;  Location: [facility] INVASIVE CV LAB;  Service: Cardiovascular;  Laterality: N/A;   MEMBRANE PEEL Left 07/24/2022   Procedure: MEMBRANE PEEL;  Surgeon: Carmela Rima, MD;  Location: [facility] OR;  Service: Ophthalmology;   Laterality: Left;   PARS PLANA VITRECTOMY Right 12/31/2019   Procedure: PARS PLANA VITRECTOMY WITH 25 GAUGE;  Surgeon: Carmela Rima, MD;  Location: [facility] OR;  Service: Ophthalmology;  Laterality: Right;   PARS PLANA VITRECTOMY Left 07/24/2022   Procedure:  PARS PLANA VITRECTOMY WITH 25 GAUGE;  Surgeon: Carmela Rima, MD;  Location: [facility] OR;  Service: Ophthalmology;  Laterality: Left;   PERIPHERAL VASCULAR INTERVENTION Left 05/12/2021   Procedure: PERIPHERAL VASCULAR INTERVENTION;  Surgeon: Victorino Sparrow, MD;  Location: [facility] INVASIVE CV LAB;  Service: Cardiovascular;  Laterality: Left;  arm fistula   PHOTOCOAGULATION Left 07/24/2022   Procedure: AIRGAS, SILICONE OIL, PHOTOCOAGULATION;  Surgeon: Carmela Rima, MD;  Location: [facility] OR;  Service: Ophthalmology;  Laterality: Left;   PHOTOCOAGULATION WITH LASER Right 12/31/2019   Procedure: PHOTOCOAGULATION WITH LASER;  Surgeon: Carmela Rima, MD;  Location: [facility] OR;  Service: Ophthalmology;  Laterality: Right;   REPAIR OF COMPLEX TRACTION RETINAL DETACHMENT Right 12/31/2019   Procedure: REPAIR OF COMPLEX TRACTION RETINAL;  Surgeon: Carmela Rima, MD;  Location: [facility] OR;  Service: Ophthalmology;  Laterality: Right;   REPAIR OF COMPLEX TRACTION RETINAL DETACHMENT Left 07/24/2022   Procedure: REPAIR OF COMPLEX TRACTION RETINAL DETACHMENT;  Surgeon: Carmela Rima, MD;  Location: [facility] OR;  Service: Ophthalmology;  Laterality: Left;   STUMP REVISION Left 06/09/2017   Procedure: REVISION LEFT BELOW KNEE AMPUTATION;  Surgeon: Nadara Mustard, MD;  Location: [facility] OR;  Service: Orthopedics;  Laterality: Left;   STUMP REVISION Left 09/04/2019   Procedure: LEFT BELOW KNEE AMPUTATION REVISION;  Surgeon: Nadara Mustard, MD;  Location: [facility] OR;  Service: Orthopedics;  Laterality: Left;   STUMP REVISION Left 09/20/2019   Procedure: LEFT BELOW KNEE AMPUTATION REVISION;  Surgeon: Nadara Mustard, MD;  Location: [facility] OR;  Service: Orthopedics;  Laterality: Left;   TONSILLECTOMY  1994     Family History  Problem Relation Age of Onset   Heart failure Mother    Diabetes Mother    Kidney disease Mother    Kidney disease Father    Diabetes Father    Diabetes Paternal Grandmother    Heart failure Paternal Grandmother    Other Other     Social History   Tobacco Use   Smoking status: Former    Current packs/day: 0.00    Average packs/day: 0.5 packs/day for 16.0 years (8.0 ttl pk-yrs)    Types: Cigarettes, E-cigarettes    Start date: 08/2005    Quit date: 08/2021    Years since quitting: 1.9   Smokeless tobacco: Never  Vaping Use   Vaping status: Some Days   Substances: Nicotine, THC, CBD, Flavoring   Devices: CBD oil  Substance Use Topics   Alcohol use: Yes    Alcohol/week: 0.0 standard drinks of alcohol    Comment: social   Drug use: Not Currently    Types: Marijuana    ROS Refer to HPI for ROS details.  Objective:   Vitals: BP 124/83 (BP Location: Left Arm)   Pulse 83   Temp 98.2 F (36.8 C) (Oral)   Resp 18   SpO2 97%   Physical Exam Vitals and nursing note reviewed.  Constitutional:      General: She is not in acute distress.    Appearance: Normal appearance. She is well-developed. She is not ill-appearing or toxic-appearing.  HENT:     Head: Normocephalic.     Nose: Nasal tenderness present. No congestion or rhinorrhea.     Right Nostril: Foreign body present.     Left Nostril: No foreign body.  Cardiovascular:     Rate and Rhythm: Normal rate.  Pulmonary:     Effort: Pulmonary effort is normal. No respiratory distress.  Skin:    General: Skin is warm and  dry.  Neurological:     General: No focal deficit present.     Mental Status: She is alert and oriented to person, place, and time.  Psychiatric:        Mood and Affect: Mood normal.     Foreign Body Removal  Date/Time: 08/02/2023 2:19 PM  Performed by: Lucky Cowboy, NP Authorized by: Lucky Cowboy, NP   Consent:    Consent obtained:  Verbal   Consent  given by:  Patient Universal protocol:    Patient identity confirmed:  Verbally with patient and arm band Location:    Location:  Face   Face location:  Nose Pre-procedure details:    Imaging:  None   Neurovascular status: intact   Anesthesia:    Anesthesia method:  None Procedure type:    Procedure complexity:  Simple Procedure details:    Localization method:  Visualized   Foreign bodies recovered:  1   Description:  Embedded nose ring   Intact foreign body removal: yes   Post-procedure details:    Neurovascular status: intact     Skin closure:  None   Dressing:  Open (no dressing)   Procedure completion:  Tolerated   No results found. However, due to the size of the patient record, not all encounters were searched. Please check Results Review for a complete set of results.  Assessment and Plan :   PDMP not reviewed this encounter.  1. Foreign body (FB) in soft tissue    Foreign body in right nare (embedded nose ring) -Foreign body in right nare removed without difficulty.  No secondary bleeding, no drainage, no sign of infection. -Continue to monitor site for any secondary signs of infection or skin irritation. -If any secondary complications return to urgent care for further evaluation management.  Lucky Cowboy   [place], [place], [place] 08/02/23 1451

## 2023-08-02 NOTE — Progress Notes (Signed)
 CC: Nose ring stuck  HPI:  [name] is a 48 y.o. female living with a history stated below and presents today for attempting to remove nose ring. Please see problem based assessment and plan for additional details.  Past Medical History:  Diagnosis Date   Abdominal muscle pain 09/08/2016   Abnormal Pap smear of cervix 2009   Abscess    history of multiple abscesses   Acute bilateral low back pain 02/13/2017   Acute blood loss anemia    Hx  Iron infusions   Acute on chronic renal failure (HCC) 07/12/2012   Acute renal failure (HCC) 07/12/2012   Acute respiratory failure with hypoxia (HCC) 10/21/2019   Adjustment disorder with depressed mood 03/26/2017   AKI (acute kidney injury) (HCC)    Dialysis t-th-s   Anemia 10/21/2019   Anemia of chronic disease 2002   Anginal pain (HCC)    Anxiety    Panic attacks   Bilateral lower extremity edema 05/13/2016   Bipolar disorder (HCC)    Carpal tunnel syndrome, bilateral 01/25/2012   Cataract    B/L cataract   Cellulitis 05/21/2014   right eye   CHF (congestive heart failure) (HCC)    Chronic bronchitis (HCC)    "get it q yr" (05/13/2013)   Chronic pain    Chronic pain of right knee 09/08/2016   Crutches as ambulation aid    uses forearm crutchs, Left  AKA with prosthetic.   Dehiscence of amputation stump (HCC)    Depression    Edema of lower extremity    Endocarditis 2002   subacute bacterial endocarditis.    Fall    Family history of anesthesia complication    "my mom has a hard time coming out from under"   Fibromyalgia    GERD (gastroesophageal reflux disease)    occ   Heart murmur    never has had any problems   Herpes simplex type 1 infection 01/16/2018   Hidradenitis suppurativa 02/24/2021   History of blood transfusion    "just low blood count" (05/13/2013)   History of sexually transmitted infection    Hyperlipidemia    Hypertension    Hypothyroidism    Hypothyroidism, adult 03/21/2014    Hypoxia    Leukocytosis    Muscle spasm 12/16/2020   Necrosis (HCC)    and ulceration   Necrotizing fasciitis s/p OR debridements 07/06/2012   Obesity    OSA on CPAP    does not use CPAP   Peripheral neuropathy    Pneumonia    Pyelonephritis 12/31/2020   Routine adult health maintenance 12/08/2016   Severe protein-calorie malnutrition (HCC)    Status post below knee amputation, left (HCC) 04/11/2017   Type II diabetes mellitus (HCC)    Type  II   Vaginal irritation 03/14/2016   Weak pulse 07/21/2021   Wound dehiscence 09/04/2019    Current Outpatient Medications on File Prior to Visit  Medication Sig Dispense Refill   allopurinol (ZYLOPRIM) 100 MG tablet Take 1 tablet by mouth 3 times a week. Take only after dialysis sessions. Do not take on non-dialysis days. 30 tablet 1   amLODipine (NORVASC) 10 MG tablet Take 1 tablet (10 mg total) by mouth daily. 90 tablet 1   aspirin 81 MG chewable tablet Chew 1 tablet (81 mg total) by mouth daily. 90 tablet 0   atorvastatin (LIPITOR) 80 MG tablet Take 1 tablet (80 mg total) by mouth daily. 30 tablet 0   Benzoyl Peroxide 2.5 %  CREA Apply 1 drop topically daily. 21 g 0   blood glucose meter kit and supplies KIT ICD 10- E11.69. Based on patient and insurance preference. Use up to four times daily as directed. 1 each 0   buPROPion (WELLBUTRIN) 100 MG tablet Take 1 tablet (100 mg total) by mouth every other day. 30 tablet 1   diclofenac Sodium (VOLTAREN) 1 % GEL APPLY 1 GRAM TOPICALLY TO AFFECTED AREA 4 TIMES DAILY AS NEEDED (Patient taking differently: Apply 1 g topically 4 (four) times daily as needed (for pain).) 100 g 2   fluticasone (FLONASE) 50 MCG/ACT nasal spray Place 1 spray into both nostrils daily. (Patient taking differently: Place 1 spray into both nostrils daily as needed for allergies.) 11.1 mL 2   furosemide (LASIX) 20 MG tablet Take 1 tablet by mouth daily. Take on non-dialysis days. Adjust per kidney docs (Patient taking  differently: Take 20 mg by mouth daily. Take on non-dialysis days. Adjust per kidney docs) 90 tablet 1   hydrOXYzine (ATARAX) 25 MG tablet Take 1 tablet (25 mg total) by mouth 3 (three) times daily as needed for anxiety. 90 tablet 0   Insulin Pen Needle (TRUEPLUS PEN NEEDLES) 31G X 5 MM MISC 1 each by Other route 3 (three) times daily. 100 each 5   ketoconazole (NIZORAL) 2 % cream Apply 1 application topically to affected area daily. 15 g 2   levothyroxine (SYNTHROID) 88 MCG tablet Take 1 tablet (88 mcg total) by mouth daily.     metoprolol succinate (TOPROL-XL) 25 MG 24 hr tablet Take 1 tablet (25 mg total) by mouth daily. 90 tablet 1   ofloxacin (OCUFLOX) 0.3 % ophthalmic solution Place 1 drop into the left eye in the morning and at bedtime.     oxyCODONE-acetaminophen (PERCOCET) 7.5-325 MG tablet Take 1 tablet by mouth every 8 (eight) hours as needed for moderate pain.     sevelamer carbonate (RENVELA) 800 MG tablet Take 6 to 7 tablets by mouth twice a day with meals (Patient not taking: Reported on 12/28/2022)     tirzepatide (MOUNJARO) 10 MG/0.5ML Pen Inject 10 mg into the skin once a week. 2 mL 0   tretinoin (RETIN-A) 0.025 % cream Apply topically to affected area at bedtime 45 g 0   Vitamin D, Ergocalciferol, (DRISDOL) 1.25 MG (50000 UNIT) CAPS capsule Take 1 capsule (50,000 Units total) by mouth every 7 (seven) days. 9 capsule 0   No current facility-administered medications on file prior to visit.    Family History  Problem Relation Age of Onset   Heart failure Mother    Diabetes Mother    Kidney disease Mother    Kidney disease Father    Diabetes Father    Diabetes Paternal Grandmother    Heart failure Paternal Grandmother    Other Other     Social History   Socioeconomic History   Marital status: Single    Spouse name: Not on file   Number of children: 0   Years of education: 11   Highest education level: Not on file  Occupational History   Occupation: disability   Tobacco Use   Smoking status: Former    Current packs/day: 0.00    Average packs/day: 0.5 packs/day for 16.0 years (8.0 ttl pk-yrs)    Types: Cigarettes, E-cigarettes    Start date: 08/2005    Quit date: 08/2021    Years since quitting: 1.9   Smokeless tobacco: Never  Vaping Use   Vaping status: Some  Days   Substances: Nicotine, THC, CBD, Flavoring   Devices: CBD oil  Substance and Sexual Activity   Alcohol use: Yes    Alcohol/week: 0.0 standard drinks of alcohol    Comment: social   Drug use: Not Currently    Types: Marijuana   Sexual activity: Yes    Birth control/protection: Condom  Other Topics Concern   Not on file  Social History Narrative   Lives with mother currently, goddaughter and uncle in a one story home.     On disability for diabetes, neuropathy, sleep apnea etc.  Disability since 2014.    Education: 11th grade.    Social Drivers of Health   Financial Resource Strain: High Risk (12/28/2022)   Overall Financial Resource Strain (CARDIA)    Difficulty of Paying Living Expenses: Hard  Food Insecurity: Food Insecurity Present (12/28/2022)   Hunger Vital Sign    Worried About Running Out of Food in the Last Year: Often true    Ran Out of Food in the Last Year: Often true  Transportation Needs: Unmet Transportation Needs (12/28/2022)   PRAPARE - Administrator, Civil Service (Medical): Yes    Lack of Transportation (Non-Medical): Yes  Physical Activity: Inactive (12/28/2022)   Exercise Vital Sign    Days of Exercise per Week: 0 days    Minutes of Exercise per Session: 0 min  Stress: No Stress Concern Present (12/28/2022)   [employer] of Occupational Health - Occupational Stress Questionnaire    Feeling of Stress : Only a little  Social Connections: Moderately Isolated (12/28/2022)   Social Connection and Isolation Panel [NHANES]    Frequency of Communication with Friends and Family: More than three times a week    Frequency of Social Gatherings with  Friends and Family: Once a week    Attends Religious Services: 1 to 4 times per year    Active Member of [employer] or Organizations: No    Attends Banker Meetings: Never    Marital Status: Widowed  Intimate Partner Violence: Not At Risk (12/28/2022)   Humiliation, Afraid, Rape, and Kick questionnaire    Fear of Current or Ex-Partner: No    Emotionally Abused: No    Physically Abused: No    Sexually Abused: No    Review of Systems: ROS negative except for what is noted on the assessment and plan.  Vitals:   08/02/23 1003  BP: 117/71  Pulse: 72  Temp: 97.9 F (36.6 C)  TempSrc: Oral  SpO2: 100%  Weight: 232 lb 4.8 oz (105.4 kg)  Height: 5\' 9"  (1.753 m)    Physical Exam: Constitutional: well-appearing female  in no acute distress HENT: normocephalic atraumatic, mucous membranes moist, horshoe type metal nose ring in place on right nasal septum, no surrounding erythema or erythema located inside the nose. Non-tender, no drainage Cardiovascular: regular rate and rhythm, no m/r/g Pulmonary/Chest: normal work of breathing on room air, lungs clear to auscultation bilaterally MSK: normal bulk and tone, L AKA       Assessment & Plan:   Nasal foreign body Patient presents for an acute visit because she was unfortunately unable to remove nose ring.  She recently got her nose pierced done by herself, she also placed the ring herself.  The ring is made of metal, and she has tried to cut off the ends to remove it however she has not been able to.  She denies any nasal drainage, erythema, fevers, chills.  Bring is been placed  for about a week now.  Unfortunately, our clinic is not equipped to cut metal, referred patient to urgent care or emergency department.  Hx of AKA (above knee amputation), left [facility]) Patient with left AKA, uses wheelchair for easier ambulation throughout the house that sometimes when she is out and about.  Her last new oen was 6 years ago, she would  benefit from a new standardized wheelchair.  Patient discussed with Dr. Pauline Good Dezire Turk, M.D. [facility] Internal Medicine, PGY-2 Pager: [phone] Date 08/02/2023 Time 11:50 AM

## 2023-08-02 NOTE — Discharge Instructions (Addendum)
 Foreign body in right nare (embedded nose ring) -Foreign body in right nare removed without difficulty.  No secondary bleeding, no drainage, no sign of infection. -Continue to monitor site for any secondary signs of infection or skin irritation. -If any secondary complications return to urgent care for further evaluation management.

## 2023-08-02 NOTE — Patient Instructions (Addendum)
 Thank you so much for coming to the clinic today!   Unfortunately, we are not able to remove the nose ring here as we do not have the proper equipment to do it.  I would recommend going to the urgent care center.  If you have any questions please feel free to the call the clinic at anytime at [phone]. It was a pleasure seeing you!  Best, Dr. Thomasene Ripple

## 2023-08-02 NOTE — ED Triage Notes (Signed)
 Patient presents to the office foreign body in her nose. Denies any odor or drainage.

## 2023-08-02 NOTE — Assessment & Plan Note (Addendum)
 Patient presents for an acute visit because she was unfortunately unable to remove nose ring.  She recently got her nose pierced done by herself, she also placed the ring herself.  The ring is made of metal, and she has tried to cut off the ends to remove it however she has not been able to.  She denies any nasal drainage, erythema, fevers, chills.  Bring is been placed for about a week now.  Unfortunately, our clinic is not equipped to cut metal, referred patient to urgent care or emergency department.

## 2023-08-03 ENCOUNTER — Other Ambulatory Visit: Payer: Self-pay

## 2023-08-03 DIAGNOSIS — D631 Anemia in chronic kidney disease: Secondary | ICD-10-CM | POA: Diagnosis not present

## 2023-08-03 DIAGNOSIS — F32A Depression, unspecified: Secondary | ICD-10-CM

## 2023-08-03 DIAGNOSIS — T7840XA Allergy, unspecified, initial encounter: Secondary | ICD-10-CM | POA: Diagnosis not present

## 2023-08-03 DIAGNOSIS — Z992 Dependence on renal dialysis: Secondary | ICD-10-CM | POA: Diagnosis not present

## 2023-08-03 DIAGNOSIS — D509 Iron deficiency anemia, unspecified: Secondary | ICD-10-CM | POA: Diagnosis not present

## 2023-08-03 DIAGNOSIS — L299 Pruritus, unspecified: Secondary | ICD-10-CM | POA: Diagnosis not present

## 2023-08-03 DIAGNOSIS — N2581 Secondary hyperparathyroidism of renal origin: Secondary | ICD-10-CM | POA: Diagnosis not present

## 2023-08-03 DIAGNOSIS — R52 Pain, unspecified: Secondary | ICD-10-CM | POA: Diagnosis not present

## 2023-08-03 DIAGNOSIS — N186 End stage renal disease: Secondary | ICD-10-CM | POA: Diagnosis not present

## 2023-08-03 DIAGNOSIS — D689 Coagulation defect, unspecified: Secondary | ICD-10-CM | POA: Diagnosis not present

## 2023-08-04 DIAGNOSIS — Z79891 Long term (current) use of opiate analgesic: Secondary | ICD-10-CM | POA: Diagnosis not present

## 2023-08-04 DIAGNOSIS — G894 Chronic pain syndrome: Secondary | ICD-10-CM | POA: Diagnosis not present

## 2023-08-04 DIAGNOSIS — M545 Low back pain, unspecified: Secondary | ICD-10-CM | POA: Diagnosis not present

## 2023-08-04 DIAGNOSIS — M25512 Pain in left shoulder: Secondary | ICD-10-CM | POA: Diagnosis not present

## 2023-08-04 DIAGNOSIS — M25552 Pain in left hip: Secondary | ICD-10-CM | POA: Diagnosis not present

## 2023-08-05 DIAGNOSIS — N186 End stage renal disease: Secondary | ICD-10-CM | POA: Diagnosis not present

## 2023-08-05 DIAGNOSIS — D631 Anemia in chronic kidney disease: Secondary | ICD-10-CM | POA: Diagnosis not present

## 2023-08-05 DIAGNOSIS — N2581 Secondary hyperparathyroidism of renal origin: Secondary | ICD-10-CM | POA: Diagnosis not present

## 2023-08-05 DIAGNOSIS — T7840XA Allergy, unspecified, initial encounter: Secondary | ICD-10-CM | POA: Diagnosis not present

## 2023-08-05 DIAGNOSIS — R52 Pain, unspecified: Secondary | ICD-10-CM | POA: Diagnosis not present

## 2023-08-05 DIAGNOSIS — D689 Coagulation defect, unspecified: Secondary | ICD-10-CM | POA: Diagnosis not present

## 2023-08-05 DIAGNOSIS — D509 Iron deficiency anemia, unspecified: Secondary | ICD-10-CM | POA: Diagnosis not present

## 2023-08-05 DIAGNOSIS — L299 Pruritus, unspecified: Secondary | ICD-10-CM | POA: Diagnosis not present

## 2023-08-05 DIAGNOSIS — Z992 Dependence on renal dialysis: Secondary | ICD-10-CM | POA: Diagnosis not present

## 2023-08-06 NOTE — Progress Notes (Signed)
 Internal Medicine Clinic Attending  Case discussed with the resident at the time of the visit.  We reviewed the resident's history and exam and pertinent patient test results.  I agree with the assessment, diagnosis, and plan of care documented in the resident's note.

## 2023-08-07 ENCOUNTER — Other Ambulatory Visit: Payer: Self-pay | Admitting: Student

## 2023-08-07 DIAGNOSIS — F419 Anxiety disorder, unspecified: Secondary | ICD-10-CM

## 2023-08-07 MED ORDER — BUPROPION HCL 100 MG PO TABS
100.0000 mg | ORAL_TABLET | ORAL | 1 refills | Status: DC
Start: 2023-08-07 — End: 2023-10-30

## 2023-08-08 DIAGNOSIS — Z992 Dependence on renal dialysis: Secondary | ICD-10-CM | POA: Diagnosis not present

## 2023-08-08 DIAGNOSIS — D689 Coagulation defect, unspecified: Secondary | ICD-10-CM | POA: Diagnosis not present

## 2023-08-08 DIAGNOSIS — T7840XA Allergy, unspecified, initial encounter: Secondary | ICD-10-CM | POA: Diagnosis not present

## 2023-08-08 DIAGNOSIS — R52 Pain, unspecified: Secondary | ICD-10-CM | POA: Diagnosis not present

## 2023-08-08 DIAGNOSIS — D509 Iron deficiency anemia, unspecified: Secondary | ICD-10-CM | POA: Diagnosis not present

## 2023-08-08 DIAGNOSIS — N2581 Secondary hyperparathyroidism of renal origin: Secondary | ICD-10-CM | POA: Diagnosis not present

## 2023-08-08 DIAGNOSIS — D631 Anemia in chronic kidney disease: Secondary | ICD-10-CM | POA: Diagnosis not present

## 2023-08-08 DIAGNOSIS — L299 Pruritus, unspecified: Secondary | ICD-10-CM | POA: Diagnosis not present

## 2023-08-08 DIAGNOSIS — N186 End stage renal disease: Secondary | ICD-10-CM | POA: Diagnosis not present

## 2023-08-09 ENCOUNTER — Telehealth: Payer: Self-pay

## 2023-08-09 NOTE — Telephone Encounter (Signed)
 VOB submitted for Durolane, right knee.

## 2023-08-09 NOTE — Telephone Encounter (Signed)
-----   Message from Adonis Huguenin sent at 07/26/2023 11:08 AM EST ----- Righ tknee supp inj

## 2023-08-10 DIAGNOSIS — Z992 Dependence on renal dialysis: Secondary | ICD-10-CM | POA: Diagnosis not present

## 2023-08-10 DIAGNOSIS — N186 End stage renal disease: Secondary | ICD-10-CM | POA: Diagnosis not present

## 2023-08-10 DIAGNOSIS — D631 Anemia in chronic kidney disease: Secondary | ICD-10-CM | POA: Diagnosis not present

## 2023-08-10 DIAGNOSIS — D509 Iron deficiency anemia, unspecified: Secondary | ICD-10-CM | POA: Diagnosis not present

## 2023-08-10 DIAGNOSIS — T7840XA Allergy, unspecified, initial encounter: Secondary | ICD-10-CM | POA: Diagnosis not present

## 2023-08-10 DIAGNOSIS — R52 Pain, unspecified: Secondary | ICD-10-CM | POA: Diagnosis not present

## 2023-08-10 DIAGNOSIS — L299 Pruritus, unspecified: Secondary | ICD-10-CM | POA: Diagnosis not present

## 2023-08-10 DIAGNOSIS — D689 Coagulation defect, unspecified: Secondary | ICD-10-CM | POA: Diagnosis not present

## 2023-08-10 DIAGNOSIS — N2581 Secondary hyperparathyroidism of renal origin: Secondary | ICD-10-CM | POA: Diagnosis not present

## 2023-08-12 DIAGNOSIS — Z992 Dependence on renal dialysis: Secondary | ICD-10-CM | POA: Diagnosis not present

## 2023-08-12 DIAGNOSIS — D689 Coagulation defect, unspecified: Secondary | ICD-10-CM | POA: Diagnosis not present

## 2023-08-12 DIAGNOSIS — R52 Pain, unspecified: Secondary | ICD-10-CM | POA: Diagnosis not present

## 2023-08-12 DIAGNOSIS — D509 Iron deficiency anemia, unspecified: Secondary | ICD-10-CM | POA: Diagnosis not present

## 2023-08-12 DIAGNOSIS — L299 Pruritus, unspecified: Secondary | ICD-10-CM | POA: Diagnosis not present

## 2023-08-12 DIAGNOSIS — N2581 Secondary hyperparathyroidism of renal origin: Secondary | ICD-10-CM | POA: Diagnosis not present

## 2023-08-12 DIAGNOSIS — T7840XA Allergy, unspecified, initial encounter: Secondary | ICD-10-CM | POA: Diagnosis not present

## 2023-08-12 DIAGNOSIS — D631 Anemia in chronic kidney disease: Secondary | ICD-10-CM | POA: Diagnosis not present

## 2023-08-12 DIAGNOSIS — N186 End stage renal disease: Secondary | ICD-10-CM | POA: Diagnosis not present

## 2023-08-15 DIAGNOSIS — L299 Pruritus, unspecified: Secondary | ICD-10-CM | POA: Diagnosis not present

## 2023-08-15 DIAGNOSIS — N2581 Secondary hyperparathyroidism of renal origin: Secondary | ICD-10-CM | POA: Diagnosis not present

## 2023-08-15 DIAGNOSIS — D689 Coagulation defect, unspecified: Secondary | ICD-10-CM | POA: Diagnosis not present

## 2023-08-15 DIAGNOSIS — Z992 Dependence on renal dialysis: Secondary | ICD-10-CM | POA: Diagnosis not present

## 2023-08-15 DIAGNOSIS — D509 Iron deficiency anemia, unspecified: Secondary | ICD-10-CM | POA: Diagnosis not present

## 2023-08-15 DIAGNOSIS — D631 Anemia in chronic kidney disease: Secondary | ICD-10-CM | POA: Diagnosis not present

## 2023-08-15 DIAGNOSIS — T7840XA Allergy, unspecified, initial encounter: Secondary | ICD-10-CM | POA: Diagnosis not present

## 2023-08-15 DIAGNOSIS — R52 Pain, unspecified: Secondary | ICD-10-CM | POA: Diagnosis not present

## 2023-08-15 DIAGNOSIS — N186 End stage renal disease: Secondary | ICD-10-CM | POA: Diagnosis not present

## 2023-08-16 DIAGNOSIS — R2681 Unsteadiness on feet: Secondary | ICD-10-CM | POA: Diagnosis not present

## 2023-08-16 DIAGNOSIS — R262 Difficulty in walking, not elsewhere classified: Secondary | ICD-10-CM | POA: Diagnosis not present

## 2023-08-16 DIAGNOSIS — M25561 Pain in right knee: Secondary | ICD-10-CM | POA: Diagnosis not present

## 2023-08-16 DIAGNOSIS — M6281 Muscle weakness (generalized): Secondary | ICD-10-CM | POA: Diagnosis not present

## 2023-08-17 DIAGNOSIS — Z992 Dependence on renal dialysis: Secondary | ICD-10-CM | POA: Diagnosis not present

## 2023-08-17 DIAGNOSIS — D509 Iron deficiency anemia, unspecified: Secondary | ICD-10-CM | POA: Diagnosis not present

## 2023-08-17 DIAGNOSIS — N2581 Secondary hyperparathyroidism of renal origin: Secondary | ICD-10-CM | POA: Diagnosis not present

## 2023-08-17 DIAGNOSIS — L299 Pruritus, unspecified: Secondary | ICD-10-CM | POA: Diagnosis not present

## 2023-08-17 DIAGNOSIS — T7840XA Allergy, unspecified, initial encounter: Secondary | ICD-10-CM | POA: Diagnosis not present

## 2023-08-17 DIAGNOSIS — D631 Anemia in chronic kidney disease: Secondary | ICD-10-CM | POA: Diagnosis not present

## 2023-08-17 DIAGNOSIS — N186 End stage renal disease: Secondary | ICD-10-CM | POA: Diagnosis not present

## 2023-08-17 DIAGNOSIS — R52 Pain, unspecified: Secondary | ICD-10-CM | POA: Diagnosis not present

## 2023-08-17 DIAGNOSIS — D689 Coagulation defect, unspecified: Secondary | ICD-10-CM | POA: Diagnosis not present

## 2023-08-19 DIAGNOSIS — N186 End stage renal disease: Secondary | ICD-10-CM | POA: Diagnosis not present

## 2023-08-19 DIAGNOSIS — R52 Pain, unspecified: Secondary | ICD-10-CM | POA: Diagnosis not present

## 2023-08-19 DIAGNOSIS — L299 Pruritus, unspecified: Secondary | ICD-10-CM | POA: Diagnosis not present

## 2023-08-19 DIAGNOSIS — D631 Anemia in chronic kidney disease: Secondary | ICD-10-CM | POA: Diagnosis not present

## 2023-08-19 DIAGNOSIS — N2581 Secondary hyperparathyroidism of renal origin: Secondary | ICD-10-CM | POA: Diagnosis not present

## 2023-08-19 DIAGNOSIS — Z992 Dependence on renal dialysis: Secondary | ICD-10-CM | POA: Diagnosis not present

## 2023-08-19 DIAGNOSIS — T7840XA Allergy, unspecified, initial encounter: Secondary | ICD-10-CM | POA: Diagnosis not present

## 2023-08-19 DIAGNOSIS — D689 Coagulation defect, unspecified: Secondary | ICD-10-CM | POA: Diagnosis not present

## 2023-08-19 DIAGNOSIS — D509 Iron deficiency anemia, unspecified: Secondary | ICD-10-CM | POA: Diagnosis not present

## 2023-08-21 DIAGNOSIS — N186 End stage renal disease: Secondary | ICD-10-CM | POA: Diagnosis not present

## 2023-08-21 DIAGNOSIS — E1122 Type 2 diabetes mellitus with diabetic chronic kidney disease: Secondary | ICD-10-CM | POA: Diagnosis not present

## 2023-08-21 DIAGNOSIS — M6281 Muscle weakness (generalized): Secondary | ICD-10-CM | POA: Diagnosis not present

## 2023-08-21 DIAGNOSIS — Z992 Dependence on renal dialysis: Secondary | ICD-10-CM | POA: Diagnosis not present

## 2023-08-21 DIAGNOSIS — R2681 Unsteadiness on feet: Secondary | ICD-10-CM | POA: Diagnosis not present

## 2023-08-21 DIAGNOSIS — R262 Difficulty in walking, not elsewhere classified: Secondary | ICD-10-CM | POA: Diagnosis not present

## 2023-08-21 DIAGNOSIS — M25561 Pain in right knee: Secondary | ICD-10-CM | POA: Diagnosis not present

## 2023-08-22 DIAGNOSIS — L299 Pruritus, unspecified: Secondary | ICD-10-CM | POA: Diagnosis not present

## 2023-08-22 DIAGNOSIS — T7840XA Allergy, unspecified, initial encounter: Secondary | ICD-10-CM | POA: Diagnosis not present

## 2023-08-22 DIAGNOSIS — D509 Iron deficiency anemia, unspecified: Secondary | ICD-10-CM | POA: Diagnosis not present

## 2023-08-22 DIAGNOSIS — E1122 Type 2 diabetes mellitus with diabetic chronic kidney disease: Secondary | ICD-10-CM | POA: Diagnosis not present

## 2023-08-22 DIAGNOSIS — R52 Pain, unspecified: Secondary | ICD-10-CM | POA: Diagnosis not present

## 2023-08-22 DIAGNOSIS — N2581 Secondary hyperparathyroidism of renal origin: Secondary | ICD-10-CM | POA: Diagnosis not present

## 2023-08-22 DIAGNOSIS — Z992 Dependence on renal dialysis: Secondary | ICD-10-CM | POA: Diagnosis not present

## 2023-08-22 DIAGNOSIS — N186 End stage renal disease: Secondary | ICD-10-CM | POA: Diagnosis not present

## 2023-08-22 DIAGNOSIS — D631 Anemia in chronic kidney disease: Secondary | ICD-10-CM | POA: Diagnosis not present

## 2023-08-22 DIAGNOSIS — D689 Coagulation defect, unspecified: Secondary | ICD-10-CM | POA: Diagnosis not present

## 2023-08-24 DIAGNOSIS — E1122 Type 2 diabetes mellitus with diabetic chronic kidney disease: Secondary | ICD-10-CM | POA: Diagnosis not present

## 2023-08-24 DIAGNOSIS — D509 Iron deficiency anemia, unspecified: Secondary | ICD-10-CM | POA: Diagnosis not present

## 2023-08-24 DIAGNOSIS — T7840XA Allergy, unspecified, initial encounter: Secondary | ICD-10-CM | POA: Diagnosis not present

## 2023-08-24 DIAGNOSIS — D631 Anemia in chronic kidney disease: Secondary | ICD-10-CM | POA: Diagnosis not present

## 2023-08-24 DIAGNOSIS — Z992 Dependence on renal dialysis: Secondary | ICD-10-CM | POA: Diagnosis not present

## 2023-08-24 DIAGNOSIS — N186 End stage renal disease: Secondary | ICD-10-CM | POA: Diagnosis not present

## 2023-08-24 DIAGNOSIS — R52 Pain, unspecified: Secondary | ICD-10-CM | POA: Diagnosis not present

## 2023-08-24 DIAGNOSIS — D689 Coagulation defect, unspecified: Secondary | ICD-10-CM | POA: Diagnosis not present

## 2023-08-24 DIAGNOSIS — N2581 Secondary hyperparathyroidism of renal origin: Secondary | ICD-10-CM | POA: Diagnosis not present

## 2023-08-24 DIAGNOSIS — L299 Pruritus, unspecified: Secondary | ICD-10-CM | POA: Diagnosis not present

## 2023-08-25 DIAGNOSIS — R2681 Unsteadiness on feet: Secondary | ICD-10-CM | POA: Diagnosis not present

## 2023-08-25 DIAGNOSIS — R262 Difficulty in walking, not elsewhere classified: Secondary | ICD-10-CM | POA: Diagnosis not present

## 2023-08-25 DIAGNOSIS — M6281 Muscle weakness (generalized): Secondary | ICD-10-CM | POA: Diagnosis not present

## 2023-08-25 DIAGNOSIS — M25561 Pain in right knee: Secondary | ICD-10-CM | POA: Diagnosis not present

## 2023-08-26 DIAGNOSIS — N186 End stage renal disease: Secondary | ICD-10-CM | POA: Diagnosis not present

## 2023-08-26 DIAGNOSIS — D631 Anemia in chronic kidney disease: Secondary | ICD-10-CM | POA: Diagnosis not present

## 2023-08-26 DIAGNOSIS — E1122 Type 2 diabetes mellitus with diabetic chronic kidney disease: Secondary | ICD-10-CM | POA: Diagnosis not present

## 2023-08-26 DIAGNOSIS — L299 Pruritus, unspecified: Secondary | ICD-10-CM | POA: Diagnosis not present

## 2023-08-26 DIAGNOSIS — D689 Coagulation defect, unspecified: Secondary | ICD-10-CM | POA: Diagnosis not present

## 2023-08-26 DIAGNOSIS — R52 Pain, unspecified: Secondary | ICD-10-CM | POA: Diagnosis not present

## 2023-08-26 DIAGNOSIS — N2581 Secondary hyperparathyroidism of renal origin: Secondary | ICD-10-CM | POA: Diagnosis not present

## 2023-08-26 DIAGNOSIS — Z992 Dependence on renal dialysis: Secondary | ICD-10-CM | POA: Diagnosis not present

## 2023-08-26 DIAGNOSIS — D509 Iron deficiency anemia, unspecified: Secondary | ICD-10-CM | POA: Diagnosis not present

## 2023-08-26 DIAGNOSIS — T7840XA Allergy, unspecified, initial encounter: Secondary | ICD-10-CM | POA: Diagnosis not present

## 2023-08-28 DIAGNOSIS — M25561 Pain in right knee: Secondary | ICD-10-CM | POA: Diagnosis not present

## 2023-08-28 DIAGNOSIS — M6281 Muscle weakness (generalized): Secondary | ICD-10-CM | POA: Diagnosis not present

## 2023-08-28 DIAGNOSIS — R262 Difficulty in walking, not elsewhere classified: Secondary | ICD-10-CM | POA: Diagnosis not present

## 2023-08-28 DIAGNOSIS — R2681 Unsteadiness on feet: Secondary | ICD-10-CM | POA: Diagnosis not present

## 2023-08-29 DIAGNOSIS — D689 Coagulation defect, unspecified: Secondary | ICD-10-CM | POA: Diagnosis not present

## 2023-08-29 DIAGNOSIS — L299 Pruritus, unspecified: Secondary | ICD-10-CM | POA: Diagnosis not present

## 2023-08-29 DIAGNOSIS — Z992 Dependence on renal dialysis: Secondary | ICD-10-CM | POA: Diagnosis not present

## 2023-08-29 DIAGNOSIS — N186 End stage renal disease: Secondary | ICD-10-CM | POA: Diagnosis not present

## 2023-08-29 DIAGNOSIS — R52 Pain, unspecified: Secondary | ICD-10-CM | POA: Diagnosis not present

## 2023-08-29 DIAGNOSIS — D631 Anemia in chronic kidney disease: Secondary | ICD-10-CM | POA: Diagnosis not present

## 2023-08-29 DIAGNOSIS — T7840XA Allergy, unspecified, initial encounter: Secondary | ICD-10-CM | POA: Diagnosis not present

## 2023-08-29 DIAGNOSIS — N2581 Secondary hyperparathyroidism of renal origin: Secondary | ICD-10-CM | POA: Diagnosis not present

## 2023-08-29 DIAGNOSIS — E1122 Type 2 diabetes mellitus with diabetic chronic kidney disease: Secondary | ICD-10-CM | POA: Diagnosis not present

## 2023-08-29 DIAGNOSIS — D509 Iron deficiency anemia, unspecified: Secondary | ICD-10-CM | POA: Diagnosis not present

## 2023-08-31 DIAGNOSIS — D631 Anemia in chronic kidney disease: Secondary | ICD-10-CM | POA: Diagnosis not present

## 2023-08-31 DIAGNOSIS — N186 End stage renal disease: Secondary | ICD-10-CM | POA: Diagnosis not present

## 2023-08-31 DIAGNOSIS — T7840XA Allergy, unspecified, initial encounter: Secondary | ICD-10-CM | POA: Diagnosis not present

## 2023-08-31 DIAGNOSIS — D689 Coagulation defect, unspecified: Secondary | ICD-10-CM | POA: Diagnosis not present

## 2023-08-31 DIAGNOSIS — R52 Pain, unspecified: Secondary | ICD-10-CM | POA: Diagnosis not present

## 2023-08-31 DIAGNOSIS — D509 Iron deficiency anemia, unspecified: Secondary | ICD-10-CM | POA: Diagnosis not present

## 2023-08-31 DIAGNOSIS — Z992 Dependence on renal dialysis: Secondary | ICD-10-CM | POA: Diagnosis not present

## 2023-08-31 DIAGNOSIS — E1122 Type 2 diabetes mellitus with diabetic chronic kidney disease: Secondary | ICD-10-CM | POA: Diagnosis not present

## 2023-08-31 DIAGNOSIS — L299 Pruritus, unspecified: Secondary | ICD-10-CM | POA: Diagnosis not present

## 2023-08-31 DIAGNOSIS — N2581 Secondary hyperparathyroidism of renal origin: Secondary | ICD-10-CM | POA: Diagnosis not present

## 2023-09-01 DIAGNOSIS — M25561 Pain in right knee: Secondary | ICD-10-CM | POA: Diagnosis not present

## 2023-09-01 DIAGNOSIS — M6281 Muscle weakness (generalized): Secondary | ICD-10-CM | POA: Diagnosis not present

## 2023-09-01 DIAGNOSIS — R2681 Unsteadiness on feet: Secondary | ICD-10-CM | POA: Diagnosis not present

## 2023-09-01 DIAGNOSIS — R262 Difficulty in walking, not elsewhere classified: Secondary | ICD-10-CM | POA: Diagnosis not present

## 2023-09-02 DIAGNOSIS — D631 Anemia in chronic kidney disease: Secondary | ICD-10-CM | POA: Diagnosis not present

## 2023-09-02 DIAGNOSIS — D509 Iron deficiency anemia, unspecified: Secondary | ICD-10-CM | POA: Diagnosis not present

## 2023-09-02 DIAGNOSIS — T7840XA Allergy, unspecified, initial encounter: Secondary | ICD-10-CM | POA: Diagnosis not present

## 2023-09-02 DIAGNOSIS — E1122 Type 2 diabetes mellitus with diabetic chronic kidney disease: Secondary | ICD-10-CM | POA: Diagnosis not present

## 2023-09-02 DIAGNOSIS — L299 Pruritus, unspecified: Secondary | ICD-10-CM | POA: Diagnosis not present

## 2023-09-02 DIAGNOSIS — R52 Pain, unspecified: Secondary | ICD-10-CM | POA: Diagnosis not present

## 2023-09-02 DIAGNOSIS — N186 End stage renal disease: Secondary | ICD-10-CM | POA: Diagnosis not present

## 2023-09-02 DIAGNOSIS — Z992 Dependence on renal dialysis: Secondary | ICD-10-CM | POA: Diagnosis not present

## 2023-09-02 DIAGNOSIS — D689 Coagulation defect, unspecified: Secondary | ICD-10-CM | POA: Diagnosis not present

## 2023-09-02 DIAGNOSIS — N2581 Secondary hyperparathyroidism of renal origin: Secondary | ICD-10-CM | POA: Diagnosis not present

## 2023-09-05 DIAGNOSIS — T7840XA Allergy, unspecified, initial encounter: Secondary | ICD-10-CM | POA: Diagnosis not present

## 2023-09-05 DIAGNOSIS — N186 End stage renal disease: Secondary | ICD-10-CM | POA: Diagnosis not present

## 2023-09-05 DIAGNOSIS — D509 Iron deficiency anemia, unspecified: Secondary | ICD-10-CM | POA: Diagnosis not present

## 2023-09-05 DIAGNOSIS — D689 Coagulation defect, unspecified: Secondary | ICD-10-CM | POA: Diagnosis not present

## 2023-09-05 DIAGNOSIS — Z992 Dependence on renal dialysis: Secondary | ICD-10-CM | POA: Diagnosis not present

## 2023-09-05 DIAGNOSIS — R52 Pain, unspecified: Secondary | ICD-10-CM | POA: Diagnosis not present

## 2023-09-05 DIAGNOSIS — L299 Pruritus, unspecified: Secondary | ICD-10-CM | POA: Diagnosis not present

## 2023-09-05 DIAGNOSIS — N2581 Secondary hyperparathyroidism of renal origin: Secondary | ICD-10-CM | POA: Diagnosis not present

## 2023-09-05 DIAGNOSIS — E1122 Type 2 diabetes mellitus with diabetic chronic kidney disease: Secondary | ICD-10-CM | POA: Diagnosis not present

## 2023-09-05 DIAGNOSIS — D631 Anemia in chronic kidney disease: Secondary | ICD-10-CM | POA: Diagnosis not present

## 2023-09-06 DIAGNOSIS — G894 Chronic pain syndrome: Secondary | ICD-10-CM | POA: Diagnosis not present

## 2023-09-06 DIAGNOSIS — M25561 Pain in right knee: Secondary | ICD-10-CM | POA: Diagnosis not present

## 2023-09-06 DIAGNOSIS — R2681 Unsteadiness on feet: Secondary | ICD-10-CM | POA: Diagnosis not present

## 2023-09-06 DIAGNOSIS — Z79891 Long term (current) use of opiate analgesic: Secondary | ICD-10-CM | POA: Diagnosis not present

## 2023-09-06 DIAGNOSIS — M6281 Muscle weakness (generalized): Secondary | ICD-10-CM | POA: Diagnosis not present

## 2023-09-06 DIAGNOSIS — M25519 Pain in unspecified shoulder: Secondary | ICD-10-CM | POA: Diagnosis not present

## 2023-09-06 DIAGNOSIS — R262 Difficulty in walking, not elsewhere classified: Secondary | ICD-10-CM | POA: Diagnosis not present

## 2023-09-06 DIAGNOSIS — M25562 Pain in left knee: Secondary | ICD-10-CM | POA: Diagnosis not present

## 2023-09-06 DIAGNOSIS — M79643 Pain in unspecified hand: Secondary | ICD-10-CM | POA: Diagnosis not present

## 2023-09-06 NOTE — Telephone Encounter (Signed)
 Called and spoke with [employer] at the [facility] Rep [place].  They denied the current order that was sent in because per Policy Guidelines, the patient is not due for a new Chair until 09/25/2024.  Pt was connected to the call with [employer] and now understands that she can only receive repairs until she is due for a new chair.  Per [place] @ [employer] she will be reaching out to her Manager to expedite this new request for Repairs "ONLY" not a new WC.     Copied from CRM [id number]. Topic: General - Other >> Sep 06, 2023 12:05 PM Retta Caster wrote: Reason for CRM: Patient calling on update for DME for wheelchair. She stated time of app on 03/12 it was stated they were going to put  Order in for her but no order in system. Tried to call office but at lunch. Needs call back ASAP on update of status  for Wheelchair. [phone]

## 2023-09-07 DIAGNOSIS — D509 Iron deficiency anemia, unspecified: Secondary | ICD-10-CM | POA: Diagnosis not present

## 2023-09-07 DIAGNOSIS — Z992 Dependence on renal dialysis: Secondary | ICD-10-CM | POA: Diagnosis not present

## 2023-09-07 DIAGNOSIS — E1122 Type 2 diabetes mellitus with diabetic chronic kidney disease: Secondary | ICD-10-CM | POA: Diagnosis not present

## 2023-09-07 DIAGNOSIS — D631 Anemia in chronic kidney disease: Secondary | ICD-10-CM | POA: Diagnosis not present

## 2023-09-07 DIAGNOSIS — N186 End stage renal disease: Secondary | ICD-10-CM | POA: Diagnosis not present

## 2023-09-07 DIAGNOSIS — N2581 Secondary hyperparathyroidism of renal origin: Secondary | ICD-10-CM | POA: Diagnosis not present

## 2023-09-07 DIAGNOSIS — L299 Pruritus, unspecified: Secondary | ICD-10-CM | POA: Diagnosis not present

## 2023-09-07 DIAGNOSIS — T7840XA Allergy, unspecified, initial encounter: Secondary | ICD-10-CM | POA: Diagnosis not present

## 2023-09-07 DIAGNOSIS — R52 Pain, unspecified: Secondary | ICD-10-CM | POA: Diagnosis not present

## 2023-09-07 DIAGNOSIS — D689 Coagulation defect, unspecified: Secondary | ICD-10-CM | POA: Diagnosis not present

## 2023-09-09 DIAGNOSIS — D631 Anemia in chronic kidney disease: Secondary | ICD-10-CM | POA: Diagnosis not present

## 2023-09-09 DIAGNOSIS — N2581 Secondary hyperparathyroidism of renal origin: Secondary | ICD-10-CM | POA: Diagnosis not present

## 2023-09-09 DIAGNOSIS — R52 Pain, unspecified: Secondary | ICD-10-CM | POA: Diagnosis not present

## 2023-09-09 DIAGNOSIS — N186 End stage renal disease: Secondary | ICD-10-CM | POA: Diagnosis not present

## 2023-09-09 DIAGNOSIS — E1122 Type 2 diabetes mellitus with diabetic chronic kidney disease: Secondary | ICD-10-CM | POA: Diagnosis not present

## 2023-09-09 DIAGNOSIS — T7840XA Allergy, unspecified, initial encounter: Secondary | ICD-10-CM | POA: Diagnosis not present

## 2023-09-09 DIAGNOSIS — D509 Iron deficiency anemia, unspecified: Secondary | ICD-10-CM | POA: Diagnosis not present

## 2023-09-09 DIAGNOSIS — D689 Coagulation defect, unspecified: Secondary | ICD-10-CM | POA: Diagnosis not present

## 2023-09-09 DIAGNOSIS — Z992 Dependence on renal dialysis: Secondary | ICD-10-CM | POA: Diagnosis not present

## 2023-09-09 DIAGNOSIS — L299 Pruritus, unspecified: Secondary | ICD-10-CM | POA: Diagnosis not present

## 2023-09-12 DIAGNOSIS — E1122 Type 2 diabetes mellitus with diabetic chronic kidney disease: Secondary | ICD-10-CM | POA: Diagnosis not present

## 2023-09-12 DIAGNOSIS — R52 Pain, unspecified: Secondary | ICD-10-CM | POA: Diagnosis not present

## 2023-09-12 DIAGNOSIS — D631 Anemia in chronic kidney disease: Secondary | ICD-10-CM | POA: Diagnosis not present

## 2023-09-12 DIAGNOSIS — L299 Pruritus, unspecified: Secondary | ICD-10-CM | POA: Diagnosis not present

## 2023-09-12 DIAGNOSIS — N186 End stage renal disease: Secondary | ICD-10-CM | POA: Diagnosis not present

## 2023-09-12 DIAGNOSIS — T7840XA Allergy, unspecified, initial encounter: Secondary | ICD-10-CM | POA: Diagnosis not present

## 2023-09-12 DIAGNOSIS — Z992 Dependence on renal dialysis: Secondary | ICD-10-CM | POA: Diagnosis not present

## 2023-09-12 DIAGNOSIS — D509 Iron deficiency anemia, unspecified: Secondary | ICD-10-CM | POA: Diagnosis not present

## 2023-09-12 DIAGNOSIS — D689 Coagulation defect, unspecified: Secondary | ICD-10-CM | POA: Diagnosis not present

## 2023-09-12 DIAGNOSIS — N2581 Secondary hyperparathyroidism of renal origin: Secondary | ICD-10-CM | POA: Diagnosis not present

## 2023-09-13 DIAGNOSIS — M6281 Muscle weakness (generalized): Secondary | ICD-10-CM | POA: Diagnosis not present

## 2023-09-13 DIAGNOSIS — M25561 Pain in right knee: Secondary | ICD-10-CM | POA: Diagnosis not present

## 2023-09-13 DIAGNOSIS — R2681 Unsteadiness on feet: Secondary | ICD-10-CM | POA: Diagnosis not present

## 2023-09-13 DIAGNOSIS — R262 Difficulty in walking, not elsewhere classified: Secondary | ICD-10-CM | POA: Diagnosis not present

## 2023-09-14 DIAGNOSIS — T7840XA Allergy, unspecified, initial encounter: Secondary | ICD-10-CM | POA: Diagnosis not present

## 2023-09-14 DIAGNOSIS — Z992 Dependence on renal dialysis: Secondary | ICD-10-CM | POA: Diagnosis not present

## 2023-09-14 DIAGNOSIS — N2581 Secondary hyperparathyroidism of renal origin: Secondary | ICD-10-CM | POA: Diagnosis not present

## 2023-09-14 DIAGNOSIS — D689 Coagulation defect, unspecified: Secondary | ICD-10-CM | POA: Diagnosis not present

## 2023-09-14 DIAGNOSIS — D509 Iron deficiency anemia, unspecified: Secondary | ICD-10-CM | POA: Diagnosis not present

## 2023-09-14 DIAGNOSIS — E1122 Type 2 diabetes mellitus with diabetic chronic kidney disease: Secondary | ICD-10-CM | POA: Diagnosis not present

## 2023-09-14 DIAGNOSIS — D631 Anemia in chronic kidney disease: Secondary | ICD-10-CM | POA: Diagnosis not present

## 2023-09-14 DIAGNOSIS — L299 Pruritus, unspecified: Secondary | ICD-10-CM | POA: Diagnosis not present

## 2023-09-14 DIAGNOSIS — N186 End stage renal disease: Secondary | ICD-10-CM | POA: Diagnosis not present

## 2023-09-14 DIAGNOSIS — R52 Pain, unspecified: Secondary | ICD-10-CM | POA: Diagnosis not present

## 2023-09-15 DIAGNOSIS — R2681 Unsteadiness on feet: Secondary | ICD-10-CM | POA: Diagnosis not present

## 2023-09-15 DIAGNOSIS — M25561 Pain in right knee: Secondary | ICD-10-CM | POA: Diagnosis not present

## 2023-09-15 DIAGNOSIS — M6281 Muscle weakness (generalized): Secondary | ICD-10-CM | POA: Diagnosis not present

## 2023-09-15 DIAGNOSIS — R262 Difficulty in walking, not elsewhere classified: Secondary | ICD-10-CM | POA: Diagnosis not present

## 2023-09-16 DIAGNOSIS — D689 Coagulation defect, unspecified: Secondary | ICD-10-CM | POA: Diagnosis not present

## 2023-09-16 DIAGNOSIS — E1122 Type 2 diabetes mellitus with diabetic chronic kidney disease: Secondary | ICD-10-CM | POA: Diagnosis not present

## 2023-09-16 DIAGNOSIS — Z992 Dependence on renal dialysis: Secondary | ICD-10-CM | POA: Diagnosis not present

## 2023-09-16 DIAGNOSIS — N186 End stage renal disease: Secondary | ICD-10-CM | POA: Diagnosis not present

## 2023-09-16 DIAGNOSIS — R52 Pain, unspecified: Secondary | ICD-10-CM | POA: Diagnosis not present

## 2023-09-16 DIAGNOSIS — T7840XA Allergy, unspecified, initial encounter: Secondary | ICD-10-CM | POA: Diagnosis not present

## 2023-09-16 DIAGNOSIS — D631 Anemia in chronic kidney disease: Secondary | ICD-10-CM | POA: Diagnosis not present

## 2023-09-16 DIAGNOSIS — L299 Pruritus, unspecified: Secondary | ICD-10-CM | POA: Diagnosis not present

## 2023-09-16 DIAGNOSIS — D509 Iron deficiency anemia, unspecified: Secondary | ICD-10-CM | POA: Diagnosis not present

## 2023-09-16 DIAGNOSIS — N2581 Secondary hyperparathyroidism of renal origin: Secondary | ICD-10-CM | POA: Diagnosis not present

## 2023-09-18 DIAGNOSIS — R2681 Unsteadiness on feet: Secondary | ICD-10-CM | POA: Diagnosis not present

## 2023-09-18 DIAGNOSIS — R262 Difficulty in walking, not elsewhere classified: Secondary | ICD-10-CM | POA: Diagnosis not present

## 2023-09-18 DIAGNOSIS — M6281 Muscle weakness (generalized): Secondary | ICD-10-CM | POA: Diagnosis not present

## 2023-09-18 DIAGNOSIS — M25561 Pain in right knee: Secondary | ICD-10-CM | POA: Diagnosis not present

## 2023-09-19 DIAGNOSIS — E1122 Type 2 diabetes mellitus with diabetic chronic kidney disease: Secondary | ICD-10-CM | POA: Diagnosis not present

## 2023-09-19 DIAGNOSIS — L299 Pruritus, unspecified: Secondary | ICD-10-CM | POA: Diagnosis not present

## 2023-09-19 DIAGNOSIS — Z992 Dependence on renal dialysis: Secondary | ICD-10-CM | POA: Diagnosis not present

## 2023-09-19 DIAGNOSIS — D509 Iron deficiency anemia, unspecified: Secondary | ICD-10-CM | POA: Diagnosis not present

## 2023-09-19 DIAGNOSIS — N2581 Secondary hyperparathyroidism of renal origin: Secondary | ICD-10-CM | POA: Diagnosis not present

## 2023-09-19 DIAGNOSIS — D631 Anemia in chronic kidney disease: Secondary | ICD-10-CM | POA: Diagnosis not present

## 2023-09-19 DIAGNOSIS — R52 Pain, unspecified: Secondary | ICD-10-CM | POA: Diagnosis not present

## 2023-09-19 DIAGNOSIS — T7840XA Allergy, unspecified, initial encounter: Secondary | ICD-10-CM | POA: Diagnosis not present

## 2023-09-19 DIAGNOSIS — N186 End stage renal disease: Secondary | ICD-10-CM | POA: Diagnosis not present

## 2023-09-19 DIAGNOSIS — D689 Coagulation defect, unspecified: Secondary | ICD-10-CM | POA: Diagnosis not present

## 2023-09-20 DIAGNOSIS — E1122 Type 2 diabetes mellitus with diabetic chronic kidney disease: Secondary | ICD-10-CM | POA: Diagnosis not present

## 2023-09-20 DIAGNOSIS — R2681 Unsteadiness on feet: Secondary | ICD-10-CM | POA: Diagnosis not present

## 2023-09-20 DIAGNOSIS — Z992 Dependence on renal dialysis: Secondary | ICD-10-CM | POA: Diagnosis not present

## 2023-09-20 DIAGNOSIS — N186 End stage renal disease: Secondary | ICD-10-CM | POA: Diagnosis not present

## 2023-09-20 DIAGNOSIS — R262 Difficulty in walking, not elsewhere classified: Secondary | ICD-10-CM | POA: Diagnosis not present

## 2023-09-20 DIAGNOSIS — M25561 Pain in right knee: Secondary | ICD-10-CM | POA: Diagnosis not present

## 2023-09-20 DIAGNOSIS — M6281 Muscle weakness (generalized): Secondary | ICD-10-CM | POA: Diagnosis not present

## 2023-09-21 DIAGNOSIS — Z992 Dependence on renal dialysis: Secondary | ICD-10-CM | POA: Diagnosis not present

## 2023-09-21 DIAGNOSIS — T7840XA Allergy, unspecified, initial encounter: Secondary | ICD-10-CM | POA: Diagnosis not present

## 2023-09-21 DIAGNOSIS — L299 Pruritus, unspecified: Secondary | ICD-10-CM | POA: Diagnosis not present

## 2023-09-21 DIAGNOSIS — D509 Iron deficiency anemia, unspecified: Secondary | ICD-10-CM | POA: Diagnosis not present

## 2023-09-21 DIAGNOSIS — D689 Coagulation defect, unspecified: Secondary | ICD-10-CM | POA: Diagnosis not present

## 2023-09-21 DIAGNOSIS — R52 Pain, unspecified: Secondary | ICD-10-CM | POA: Diagnosis not present

## 2023-09-21 DIAGNOSIS — N2581 Secondary hyperparathyroidism of renal origin: Secondary | ICD-10-CM | POA: Diagnosis not present

## 2023-09-21 DIAGNOSIS — R197 Diarrhea, unspecified: Secondary | ICD-10-CM | POA: Diagnosis not present

## 2023-09-21 DIAGNOSIS — E8770 Fluid overload, unspecified: Secondary | ICD-10-CM | POA: Diagnosis not present

## 2023-09-21 DIAGNOSIS — D631 Anemia in chronic kidney disease: Secondary | ICD-10-CM | POA: Diagnosis not present

## 2023-09-21 DIAGNOSIS — N186 End stage renal disease: Secondary | ICD-10-CM | POA: Diagnosis not present

## 2023-09-23 DIAGNOSIS — E8770 Fluid overload, unspecified: Secondary | ICD-10-CM | POA: Diagnosis not present

## 2023-09-23 DIAGNOSIS — N186 End stage renal disease: Secondary | ICD-10-CM | POA: Diagnosis not present

## 2023-09-23 DIAGNOSIS — D631 Anemia in chronic kidney disease: Secondary | ICD-10-CM | POA: Diagnosis not present

## 2023-09-23 DIAGNOSIS — D689 Coagulation defect, unspecified: Secondary | ICD-10-CM | POA: Diagnosis not present

## 2023-09-23 DIAGNOSIS — L299 Pruritus, unspecified: Secondary | ICD-10-CM | POA: Diagnosis not present

## 2023-09-23 DIAGNOSIS — R52 Pain, unspecified: Secondary | ICD-10-CM | POA: Diagnosis not present

## 2023-09-23 DIAGNOSIS — D509 Iron deficiency anemia, unspecified: Secondary | ICD-10-CM | POA: Diagnosis not present

## 2023-09-23 DIAGNOSIS — R197 Diarrhea, unspecified: Secondary | ICD-10-CM | POA: Diagnosis not present

## 2023-09-23 DIAGNOSIS — N2581 Secondary hyperparathyroidism of renal origin: Secondary | ICD-10-CM | POA: Diagnosis not present

## 2023-09-23 DIAGNOSIS — Z992 Dependence on renal dialysis: Secondary | ICD-10-CM | POA: Diagnosis not present

## 2023-09-23 DIAGNOSIS — T7840XA Allergy, unspecified, initial encounter: Secondary | ICD-10-CM | POA: Diagnosis not present

## 2023-09-25 ENCOUNTER — Other Ambulatory Visit: Payer: Self-pay | Admitting: Student

## 2023-09-25 DIAGNOSIS — R262 Difficulty in walking, not elsewhere classified: Secondary | ICD-10-CM | POA: Diagnosis not present

## 2023-09-25 DIAGNOSIS — I152 Hypertension secondary to endocrine disorders: Secondary | ICD-10-CM

## 2023-09-25 DIAGNOSIS — R2681 Unsteadiness on feet: Secondary | ICD-10-CM | POA: Diagnosis not present

## 2023-09-25 DIAGNOSIS — M25561 Pain in right knee: Secondary | ICD-10-CM | POA: Diagnosis not present

## 2023-09-25 DIAGNOSIS — M6281 Muscle weakness (generalized): Secondary | ICD-10-CM | POA: Diagnosis not present

## 2023-09-25 NOTE — Telephone Encounter (Signed)
 Medication sent to pharmacy

## 2023-09-26 DIAGNOSIS — E8770 Fluid overload, unspecified: Secondary | ICD-10-CM | POA: Diagnosis not present

## 2023-09-26 DIAGNOSIS — R52 Pain, unspecified: Secondary | ICD-10-CM | POA: Diagnosis not present

## 2023-09-26 DIAGNOSIS — T7840XA Allergy, unspecified, initial encounter: Secondary | ICD-10-CM | POA: Diagnosis not present

## 2023-09-26 DIAGNOSIS — Z992 Dependence on renal dialysis: Secondary | ICD-10-CM | POA: Diagnosis not present

## 2023-09-26 DIAGNOSIS — D689 Coagulation defect, unspecified: Secondary | ICD-10-CM | POA: Diagnosis not present

## 2023-09-26 DIAGNOSIS — D631 Anemia in chronic kidney disease: Secondary | ICD-10-CM | POA: Diagnosis not present

## 2023-09-26 DIAGNOSIS — N2581 Secondary hyperparathyroidism of renal origin: Secondary | ICD-10-CM | POA: Diagnosis not present

## 2023-09-26 DIAGNOSIS — R197 Diarrhea, unspecified: Secondary | ICD-10-CM | POA: Diagnosis not present

## 2023-09-26 DIAGNOSIS — D509 Iron deficiency anemia, unspecified: Secondary | ICD-10-CM | POA: Diagnosis not present

## 2023-09-26 DIAGNOSIS — L299 Pruritus, unspecified: Secondary | ICD-10-CM | POA: Diagnosis not present

## 2023-09-26 DIAGNOSIS — N186 End stage renal disease: Secondary | ICD-10-CM | POA: Diagnosis not present

## 2023-09-27 ENCOUNTER — Other Ambulatory Visit: Payer: Self-pay | Admitting: Student

## 2023-09-27 DIAGNOSIS — R2681 Unsteadiness on feet: Secondary | ICD-10-CM | POA: Diagnosis not present

## 2023-09-27 DIAGNOSIS — E1159 Type 2 diabetes mellitus with other circulatory complications: Secondary | ICD-10-CM

## 2023-09-27 DIAGNOSIS — M6281 Muscle weakness (generalized): Secondary | ICD-10-CM | POA: Diagnosis not present

## 2023-09-27 DIAGNOSIS — M25561 Pain in right knee: Secondary | ICD-10-CM | POA: Diagnosis not present

## 2023-09-27 DIAGNOSIS — R262 Difficulty in walking, not elsewhere classified: Secondary | ICD-10-CM | POA: Diagnosis not present

## 2023-09-27 NOTE — Telephone Encounter (Signed)
 Copied from CRM [id number]. Topic: Clinical - Medication Refill >> Sep 27, 2023  9:49 AM Shelby Dessert H wrote: Medication: tirzepatide  (MOUNJARO ) 10 MG/0.5ML Pen  Has the patient contacted their pharmacy? Yes (Agent: If no, request that the patient contact the pharmacy for the refill. If patient does not wish to contact the pharmacy document the reason why and proceed with request.) (Agent: If yes, when and what did the pharmacy advise?)  This is the patient's preferred pharmacy:  [facility] - [place], [place] - [address] [place] [address] Phone: [phone] Fax: [phone]  Is this the correct pharmacy for this prescription? Yes If no, delete pharmacy and type the correct one.   Has the prescription been filled recently? Yes  Is the patient out of the medication? Yes  Has the patient been seen for an appointment in the last year OR does the patient have an upcoming appointment? Yes  Can we respond through MyChart? Yes  Agent: Please be advised that Rx refills may take up to 3 business days. We ask that you follow-up with your pharmacy.

## 2023-09-27 NOTE — Telephone Encounter (Signed)
 Last Fill: 01/25/23  Last OV: 08/02/23 Next OV: None Scheduled  Routing to provider for review/authorization.

## 2023-09-27 NOTE — Telephone Encounter (Signed)
 Copied from CRM [id number]. Topic: Clinical - Medication Refill >> Sep 27, 2023  9:47 AM Shelby Dessert H wrote: Medication: tirzepatide  (MOUNJARO ) 10 MG/0.5ML Pen  Has the patient contacted their pharmacy? Yes (Agent: If no, request that the patient contact the pharmacy for the refill. If patient does not wish to contact the pharmacy document the reason why and proceed with request.) (Agent: If yes, when and what did the pharmacy advise?)  This is the patient's preferred pharmacy:  [facility] - [place], [place] - [address] [place] [address] Phone: [phone] Fax: [phone]  Is this the correct pharmacy for this prescription? Yes If no, delete pharmacy and type the correct one.   Has the prescription been filled recently? Yes  Is the patient out of the medication? Yes  Has the patient been seen for an appointment in the last year OR does the patient have an upcoming appointment? Yes  Can we respond through MyChart? Yes  Agent: Please be advised that Rx refills may take up to 3 business days. We ask that you follow-up with your pharmacy.

## 2023-09-28 ENCOUNTER — Other Ambulatory Visit: Payer: Self-pay | Admitting: Student

## 2023-09-28 DIAGNOSIS — D631 Anemia in chronic kidney disease: Secondary | ICD-10-CM | POA: Diagnosis not present

## 2023-09-28 DIAGNOSIS — N2581 Secondary hyperparathyroidism of renal origin: Secondary | ICD-10-CM | POA: Diagnosis not present

## 2023-09-28 DIAGNOSIS — R52 Pain, unspecified: Secondary | ICD-10-CM | POA: Diagnosis not present

## 2023-09-28 DIAGNOSIS — L299 Pruritus, unspecified: Secondary | ICD-10-CM | POA: Diagnosis not present

## 2023-09-28 DIAGNOSIS — T7840XA Allergy, unspecified, initial encounter: Secondary | ICD-10-CM | POA: Diagnosis not present

## 2023-09-28 DIAGNOSIS — N186 End stage renal disease: Secondary | ICD-10-CM | POA: Diagnosis not present

## 2023-09-28 DIAGNOSIS — Z992 Dependence on renal dialysis: Secondary | ICD-10-CM | POA: Diagnosis not present

## 2023-09-28 DIAGNOSIS — E8770 Fluid overload, unspecified: Secondary | ICD-10-CM | POA: Diagnosis not present

## 2023-09-28 DIAGNOSIS — D509 Iron deficiency anemia, unspecified: Secondary | ICD-10-CM | POA: Diagnosis not present

## 2023-09-28 DIAGNOSIS — R197 Diarrhea, unspecified: Secondary | ICD-10-CM | POA: Diagnosis not present

## 2023-09-28 DIAGNOSIS — D689 Coagulation defect, unspecified: Secondary | ICD-10-CM | POA: Diagnosis not present

## 2023-09-29 DIAGNOSIS — R52 Pain, unspecified: Secondary | ICD-10-CM | POA: Diagnosis not present

## 2023-09-29 DIAGNOSIS — Z79891 Long term (current) use of opiate analgesic: Secondary | ICD-10-CM | POA: Diagnosis not present

## 2023-09-29 DIAGNOSIS — M545 Low back pain, unspecified: Secondary | ICD-10-CM | POA: Diagnosis not present

## 2023-09-29 DIAGNOSIS — M79643 Pain in unspecified hand: Secondary | ICD-10-CM | POA: Diagnosis not present

## 2023-09-29 DIAGNOSIS — G894 Chronic pain syndrome: Secondary | ICD-10-CM | POA: Diagnosis not present

## 2023-09-29 DIAGNOSIS — G8929 Other chronic pain: Secondary | ICD-10-CM | POA: Diagnosis not present

## 2023-09-29 DIAGNOSIS — M25519 Pain in unspecified shoulder: Secondary | ICD-10-CM | POA: Diagnosis not present

## 2023-09-29 NOTE — Telephone Encounter (Signed)
 Copied from CRM [id number]. Topic: Clinical - Prescription Issue >> Sep 29, 2023 10:54 AM [name] [name] wrote: Reason for CRM: Patient called and wanted the status of her rx, patient stated she would call the pharmacy and I explained to the patient that we are still in our 3 day window but as of yesterday I see that it was sent over to [facility] pharmacy and the patient said she will call them today and check. Patients callback number is [phone]

## 2023-09-29 NOTE — Telephone Encounter (Signed)
 Last rx written 01/25/23.

## 2023-09-30 DIAGNOSIS — D631 Anemia in chronic kidney disease: Secondary | ICD-10-CM | POA: Diagnosis not present

## 2023-09-30 DIAGNOSIS — Z992 Dependence on renal dialysis: Secondary | ICD-10-CM | POA: Diagnosis not present

## 2023-09-30 DIAGNOSIS — R197 Diarrhea, unspecified: Secondary | ICD-10-CM | POA: Diagnosis not present

## 2023-09-30 DIAGNOSIS — R52 Pain, unspecified: Secondary | ICD-10-CM | POA: Diagnosis not present

## 2023-09-30 DIAGNOSIS — N2581 Secondary hyperparathyroidism of renal origin: Secondary | ICD-10-CM | POA: Diagnosis not present

## 2023-09-30 DIAGNOSIS — D509 Iron deficiency anemia, unspecified: Secondary | ICD-10-CM | POA: Diagnosis not present

## 2023-09-30 DIAGNOSIS — L299 Pruritus, unspecified: Secondary | ICD-10-CM | POA: Diagnosis not present

## 2023-09-30 DIAGNOSIS — E8770 Fluid overload, unspecified: Secondary | ICD-10-CM | POA: Diagnosis not present

## 2023-09-30 DIAGNOSIS — T7840XA Allergy, unspecified, initial encounter: Secondary | ICD-10-CM | POA: Diagnosis not present

## 2023-09-30 DIAGNOSIS — N186 End stage renal disease: Secondary | ICD-10-CM | POA: Diagnosis not present

## 2023-09-30 DIAGNOSIS — D689 Coagulation defect, unspecified: Secondary | ICD-10-CM | POA: Diagnosis not present

## 2023-10-02 DIAGNOSIS — M6281 Muscle weakness (generalized): Secondary | ICD-10-CM | POA: Diagnosis not present

## 2023-10-02 DIAGNOSIS — R262 Difficulty in walking, not elsewhere classified: Secondary | ICD-10-CM | POA: Diagnosis not present

## 2023-10-02 DIAGNOSIS — M25561 Pain in right knee: Secondary | ICD-10-CM | POA: Diagnosis not present

## 2023-10-02 DIAGNOSIS — R2681 Unsteadiness on feet: Secondary | ICD-10-CM | POA: Diagnosis not present

## 2023-10-02 NOTE — Telephone Encounter (Signed)
 Call was blind transferred to me from E2C2 from [facility] speaking with Joy.  Please call them back in reference to medication listed below.  Message has been forward to triage nurse.

## 2023-10-03 DIAGNOSIS — R52 Pain, unspecified: Secondary | ICD-10-CM | POA: Diagnosis not present

## 2023-10-03 DIAGNOSIS — D631 Anemia in chronic kidney disease: Secondary | ICD-10-CM | POA: Diagnosis not present

## 2023-10-03 DIAGNOSIS — R197 Diarrhea, unspecified: Secondary | ICD-10-CM | POA: Diagnosis not present

## 2023-10-03 DIAGNOSIS — D509 Iron deficiency anemia, unspecified: Secondary | ICD-10-CM | POA: Diagnosis not present

## 2023-10-03 DIAGNOSIS — D689 Coagulation defect, unspecified: Secondary | ICD-10-CM | POA: Diagnosis not present

## 2023-10-03 DIAGNOSIS — T7840XA Allergy, unspecified, initial encounter: Secondary | ICD-10-CM | POA: Diagnosis not present

## 2023-10-03 DIAGNOSIS — N2581 Secondary hyperparathyroidism of renal origin: Secondary | ICD-10-CM | POA: Diagnosis not present

## 2023-10-03 DIAGNOSIS — Z992 Dependence on renal dialysis: Secondary | ICD-10-CM | POA: Diagnosis not present

## 2023-10-03 DIAGNOSIS — L299 Pruritus, unspecified: Secondary | ICD-10-CM | POA: Diagnosis not present

## 2023-10-03 DIAGNOSIS — E8770 Fluid overload, unspecified: Secondary | ICD-10-CM | POA: Diagnosis not present

## 2023-10-03 DIAGNOSIS — N186 End stage renal disease: Secondary | ICD-10-CM | POA: Diagnosis not present

## 2023-10-04 DIAGNOSIS — R262 Difficulty in walking, not elsewhere classified: Secondary | ICD-10-CM | POA: Diagnosis not present

## 2023-10-04 DIAGNOSIS — R2681 Unsteadiness on feet: Secondary | ICD-10-CM | POA: Diagnosis not present

## 2023-10-04 DIAGNOSIS — M25561 Pain in right knee: Secondary | ICD-10-CM | POA: Diagnosis not present

## 2023-10-04 DIAGNOSIS — M6281 Muscle weakness (generalized): Secondary | ICD-10-CM | POA: Diagnosis not present

## 2023-10-05 DIAGNOSIS — D509 Iron deficiency anemia, unspecified: Secondary | ICD-10-CM | POA: Diagnosis not present

## 2023-10-05 DIAGNOSIS — E8770 Fluid overload, unspecified: Secondary | ICD-10-CM | POA: Diagnosis not present

## 2023-10-05 DIAGNOSIS — L299 Pruritus, unspecified: Secondary | ICD-10-CM | POA: Diagnosis not present

## 2023-10-05 DIAGNOSIS — N2581 Secondary hyperparathyroidism of renal origin: Secondary | ICD-10-CM | POA: Diagnosis not present

## 2023-10-05 DIAGNOSIS — Z992 Dependence on renal dialysis: Secondary | ICD-10-CM | POA: Diagnosis not present

## 2023-10-05 DIAGNOSIS — D689 Coagulation defect, unspecified: Secondary | ICD-10-CM | POA: Diagnosis not present

## 2023-10-05 DIAGNOSIS — N186 End stage renal disease: Secondary | ICD-10-CM | POA: Diagnosis not present

## 2023-10-05 DIAGNOSIS — R197 Diarrhea, unspecified: Secondary | ICD-10-CM | POA: Diagnosis not present

## 2023-10-05 DIAGNOSIS — D631 Anemia in chronic kidney disease: Secondary | ICD-10-CM | POA: Diagnosis not present

## 2023-10-05 DIAGNOSIS — T7840XA Allergy, unspecified, initial encounter: Secondary | ICD-10-CM | POA: Diagnosis not present

## 2023-10-05 DIAGNOSIS — R52 Pain, unspecified: Secondary | ICD-10-CM | POA: Diagnosis not present

## 2023-10-07 DIAGNOSIS — D509 Iron deficiency anemia, unspecified: Secondary | ICD-10-CM | POA: Diagnosis not present

## 2023-10-07 DIAGNOSIS — D631 Anemia in chronic kidney disease: Secondary | ICD-10-CM | POA: Diagnosis not present

## 2023-10-07 DIAGNOSIS — L299 Pruritus, unspecified: Secondary | ICD-10-CM | POA: Diagnosis not present

## 2023-10-07 DIAGNOSIS — Z992 Dependence on renal dialysis: Secondary | ICD-10-CM | POA: Diagnosis not present

## 2023-10-07 DIAGNOSIS — D689 Coagulation defect, unspecified: Secondary | ICD-10-CM | POA: Diagnosis not present

## 2023-10-07 DIAGNOSIS — T7840XA Allergy, unspecified, initial encounter: Secondary | ICD-10-CM | POA: Diagnosis not present

## 2023-10-07 DIAGNOSIS — N2581 Secondary hyperparathyroidism of renal origin: Secondary | ICD-10-CM | POA: Diagnosis not present

## 2023-10-07 DIAGNOSIS — R52 Pain, unspecified: Secondary | ICD-10-CM | POA: Diagnosis not present

## 2023-10-07 DIAGNOSIS — N186 End stage renal disease: Secondary | ICD-10-CM | POA: Diagnosis not present

## 2023-10-07 DIAGNOSIS — E8770 Fluid overload, unspecified: Secondary | ICD-10-CM | POA: Diagnosis not present

## 2023-10-07 DIAGNOSIS — R197 Diarrhea, unspecified: Secondary | ICD-10-CM | POA: Diagnosis not present

## 2023-10-09 DIAGNOSIS — L299 Pruritus, unspecified: Secondary | ICD-10-CM | POA: Diagnosis not present

## 2023-10-09 DIAGNOSIS — R52 Pain, unspecified: Secondary | ICD-10-CM | POA: Diagnosis not present

## 2023-10-09 DIAGNOSIS — T7840XA Allergy, unspecified, initial encounter: Secondary | ICD-10-CM | POA: Diagnosis not present

## 2023-10-09 DIAGNOSIS — E8770 Fluid overload, unspecified: Secondary | ICD-10-CM | POA: Diagnosis not present

## 2023-10-09 DIAGNOSIS — R197 Diarrhea, unspecified: Secondary | ICD-10-CM | POA: Diagnosis not present

## 2023-10-09 DIAGNOSIS — N186 End stage renal disease: Secondary | ICD-10-CM | POA: Diagnosis not present

## 2023-10-09 DIAGNOSIS — D509 Iron deficiency anemia, unspecified: Secondary | ICD-10-CM | POA: Diagnosis not present

## 2023-10-09 DIAGNOSIS — Z992 Dependence on renal dialysis: Secondary | ICD-10-CM | POA: Diagnosis not present

## 2023-10-09 DIAGNOSIS — N2581 Secondary hyperparathyroidism of renal origin: Secondary | ICD-10-CM | POA: Diagnosis not present

## 2023-10-09 DIAGNOSIS — D689 Coagulation defect, unspecified: Secondary | ICD-10-CM | POA: Diagnosis not present

## 2023-10-09 DIAGNOSIS — D631 Anemia in chronic kidney disease: Secondary | ICD-10-CM | POA: Diagnosis not present

## 2023-10-10 DIAGNOSIS — R197 Diarrhea, unspecified: Secondary | ICD-10-CM | POA: Diagnosis not present

## 2023-10-10 DIAGNOSIS — E8770 Fluid overload, unspecified: Secondary | ICD-10-CM | POA: Diagnosis not present

## 2023-10-10 DIAGNOSIS — Z992 Dependence on renal dialysis: Secondary | ICD-10-CM | POA: Diagnosis not present

## 2023-10-10 DIAGNOSIS — N186 End stage renal disease: Secondary | ICD-10-CM | POA: Diagnosis not present

## 2023-10-10 DIAGNOSIS — D631 Anemia in chronic kidney disease: Secondary | ICD-10-CM | POA: Diagnosis not present

## 2023-10-10 DIAGNOSIS — T7840XA Allergy, unspecified, initial encounter: Secondary | ICD-10-CM | POA: Diagnosis not present

## 2023-10-10 DIAGNOSIS — N2581 Secondary hyperparathyroidism of renal origin: Secondary | ICD-10-CM | POA: Diagnosis not present

## 2023-10-10 DIAGNOSIS — R52 Pain, unspecified: Secondary | ICD-10-CM | POA: Diagnosis not present

## 2023-10-10 DIAGNOSIS — D689 Coagulation defect, unspecified: Secondary | ICD-10-CM | POA: Diagnosis not present

## 2023-10-10 DIAGNOSIS — L299 Pruritus, unspecified: Secondary | ICD-10-CM | POA: Diagnosis not present

## 2023-10-10 DIAGNOSIS — D509 Iron deficiency anemia, unspecified: Secondary | ICD-10-CM | POA: Diagnosis not present

## 2023-10-11 DIAGNOSIS — M25561 Pain in right knee: Secondary | ICD-10-CM | POA: Diagnosis not present

## 2023-10-11 DIAGNOSIS — R2681 Unsteadiness on feet: Secondary | ICD-10-CM | POA: Diagnosis not present

## 2023-10-11 DIAGNOSIS — M6281 Muscle weakness (generalized): Secondary | ICD-10-CM | POA: Diagnosis not present

## 2023-10-11 DIAGNOSIS — R262 Difficulty in walking, not elsewhere classified: Secondary | ICD-10-CM | POA: Diagnosis not present

## 2023-10-12 DIAGNOSIS — E8770 Fluid overload, unspecified: Secondary | ICD-10-CM | POA: Diagnosis not present

## 2023-10-12 DIAGNOSIS — T7840XA Allergy, unspecified, initial encounter: Secondary | ICD-10-CM | POA: Diagnosis not present

## 2023-10-12 DIAGNOSIS — N186 End stage renal disease: Secondary | ICD-10-CM | POA: Diagnosis not present

## 2023-10-12 DIAGNOSIS — L299 Pruritus, unspecified: Secondary | ICD-10-CM | POA: Diagnosis not present

## 2023-10-12 DIAGNOSIS — R197 Diarrhea, unspecified: Secondary | ICD-10-CM | POA: Diagnosis not present

## 2023-10-12 DIAGNOSIS — N2581 Secondary hyperparathyroidism of renal origin: Secondary | ICD-10-CM | POA: Diagnosis not present

## 2023-10-12 DIAGNOSIS — R52 Pain, unspecified: Secondary | ICD-10-CM | POA: Diagnosis not present

## 2023-10-12 DIAGNOSIS — D631 Anemia in chronic kidney disease: Secondary | ICD-10-CM | POA: Diagnosis not present

## 2023-10-12 DIAGNOSIS — D689 Coagulation defect, unspecified: Secondary | ICD-10-CM | POA: Diagnosis not present

## 2023-10-12 DIAGNOSIS — D509 Iron deficiency anemia, unspecified: Secondary | ICD-10-CM | POA: Diagnosis not present

## 2023-10-12 DIAGNOSIS — Z992 Dependence on renal dialysis: Secondary | ICD-10-CM | POA: Diagnosis not present

## 2023-10-13 DIAGNOSIS — M25561 Pain in right knee: Secondary | ICD-10-CM | POA: Diagnosis not present

## 2023-10-13 DIAGNOSIS — M6281 Muscle weakness (generalized): Secondary | ICD-10-CM | POA: Diagnosis not present

## 2023-10-13 DIAGNOSIS — R2681 Unsteadiness on feet: Secondary | ICD-10-CM | POA: Diagnosis not present

## 2023-10-13 DIAGNOSIS — R262 Difficulty in walking, not elsewhere classified: Secondary | ICD-10-CM | POA: Diagnosis not present

## 2023-10-14 DIAGNOSIS — D631 Anemia in chronic kidney disease: Secondary | ICD-10-CM | POA: Diagnosis not present

## 2023-10-14 DIAGNOSIS — E8770 Fluid overload, unspecified: Secondary | ICD-10-CM | POA: Diagnosis not present

## 2023-10-14 DIAGNOSIS — N186 End stage renal disease: Secondary | ICD-10-CM | POA: Diagnosis not present

## 2023-10-14 DIAGNOSIS — N2581 Secondary hyperparathyroidism of renal origin: Secondary | ICD-10-CM | POA: Diagnosis not present

## 2023-10-14 DIAGNOSIS — D689 Coagulation defect, unspecified: Secondary | ICD-10-CM | POA: Diagnosis not present

## 2023-10-14 DIAGNOSIS — L299 Pruritus, unspecified: Secondary | ICD-10-CM | POA: Diagnosis not present

## 2023-10-14 DIAGNOSIS — D509 Iron deficiency anemia, unspecified: Secondary | ICD-10-CM | POA: Diagnosis not present

## 2023-10-14 DIAGNOSIS — Z992 Dependence on renal dialysis: Secondary | ICD-10-CM | POA: Diagnosis not present

## 2023-10-14 DIAGNOSIS — R52 Pain, unspecified: Secondary | ICD-10-CM | POA: Diagnosis not present

## 2023-10-14 DIAGNOSIS — R197 Diarrhea, unspecified: Secondary | ICD-10-CM | POA: Diagnosis not present

## 2023-10-14 DIAGNOSIS — T7840XA Allergy, unspecified, initial encounter: Secondary | ICD-10-CM | POA: Diagnosis not present

## 2023-10-17 DIAGNOSIS — D689 Coagulation defect, unspecified: Secondary | ICD-10-CM | POA: Diagnosis not present

## 2023-10-17 DIAGNOSIS — T7840XA Allergy, unspecified, initial encounter: Secondary | ICD-10-CM | POA: Diagnosis not present

## 2023-10-17 DIAGNOSIS — Z992 Dependence on renal dialysis: Secondary | ICD-10-CM | POA: Diagnosis not present

## 2023-10-17 DIAGNOSIS — N186 End stage renal disease: Secondary | ICD-10-CM | POA: Diagnosis not present

## 2023-10-17 DIAGNOSIS — R197 Diarrhea, unspecified: Secondary | ICD-10-CM | POA: Diagnosis not present

## 2023-10-17 DIAGNOSIS — R52 Pain, unspecified: Secondary | ICD-10-CM | POA: Diagnosis not present

## 2023-10-17 DIAGNOSIS — N2581 Secondary hyperparathyroidism of renal origin: Secondary | ICD-10-CM | POA: Diagnosis not present

## 2023-10-17 DIAGNOSIS — E8770 Fluid overload, unspecified: Secondary | ICD-10-CM | POA: Diagnosis not present

## 2023-10-17 DIAGNOSIS — L299 Pruritus, unspecified: Secondary | ICD-10-CM | POA: Diagnosis not present

## 2023-10-17 DIAGNOSIS — D509 Iron deficiency anemia, unspecified: Secondary | ICD-10-CM | POA: Diagnosis not present

## 2023-10-17 DIAGNOSIS — D631 Anemia in chronic kidney disease: Secondary | ICD-10-CM | POA: Diagnosis not present

## 2023-10-17 NOTE — Telephone Encounter (Signed)
 Spoke with the patient:  Name: [name] [name], [name] [name] MRN: [id number]  Date: 10/20/2023 Status: Sch  Time: 9:15 AM Length: 30  Visit Type: OPEN ESTABLISHED [726] Copay: $0.00  Provider: Maxie Spaniel, MD      Copied from CRM [id number]. Topic: Clinical - Order For Equipment >> Oct 13, 2023 10:44 AM Brynn Caras wrote: Reason for CRM: The patient's Physical Therapist is recommending water therapy, the patient states a script will be needed since she currently has a  prosthetic. She states her Physical Therapist advised a water-friendly prosthetic leg may be effective, she would like to discuss the process of obtaining this. Callback [phone]

## 2023-10-18 DIAGNOSIS — R262 Difficulty in walking, not elsewhere classified: Secondary | ICD-10-CM | POA: Diagnosis not present

## 2023-10-18 DIAGNOSIS — M25561 Pain in right knee: Secondary | ICD-10-CM | POA: Diagnosis not present

## 2023-10-18 DIAGNOSIS — M6281 Muscle weakness (generalized): Secondary | ICD-10-CM | POA: Diagnosis not present

## 2023-10-18 DIAGNOSIS — R2681 Unsteadiness on feet: Secondary | ICD-10-CM | POA: Diagnosis not present

## 2023-10-19 DIAGNOSIS — R52 Pain, unspecified: Secondary | ICD-10-CM | POA: Diagnosis not present

## 2023-10-19 DIAGNOSIS — R197 Diarrhea, unspecified: Secondary | ICD-10-CM | POA: Diagnosis not present

## 2023-10-19 DIAGNOSIS — T7840XA Allergy, unspecified, initial encounter: Secondary | ICD-10-CM | POA: Diagnosis not present

## 2023-10-19 DIAGNOSIS — N186 End stage renal disease: Secondary | ICD-10-CM | POA: Diagnosis not present

## 2023-10-19 DIAGNOSIS — N2581 Secondary hyperparathyroidism of renal origin: Secondary | ICD-10-CM | POA: Diagnosis not present

## 2023-10-19 DIAGNOSIS — E8770 Fluid overload, unspecified: Secondary | ICD-10-CM | POA: Diagnosis not present

## 2023-10-19 DIAGNOSIS — D631 Anemia in chronic kidney disease: Secondary | ICD-10-CM | POA: Diagnosis not present

## 2023-10-19 DIAGNOSIS — D509 Iron deficiency anemia, unspecified: Secondary | ICD-10-CM | POA: Diagnosis not present

## 2023-10-19 DIAGNOSIS — L299 Pruritus, unspecified: Secondary | ICD-10-CM | POA: Diagnosis not present

## 2023-10-19 DIAGNOSIS — Z992 Dependence on renal dialysis: Secondary | ICD-10-CM | POA: Diagnosis not present

## 2023-10-19 DIAGNOSIS — D689 Coagulation defect, unspecified: Secondary | ICD-10-CM | POA: Diagnosis not present

## 2023-10-20 ENCOUNTER — Ambulatory Visit (INDEPENDENT_AMBULATORY_CARE_PROVIDER_SITE_OTHER): Admitting: Student

## 2023-10-20 ENCOUNTER — Encounter: Payer: Self-pay | Admitting: Student

## 2023-10-20 VITALS — BP 111/67 | HR 84 | Temp 98.0°F | Ht 69.5 in | Wt 219.0 lb

## 2023-10-20 DIAGNOSIS — R238 Other skin changes: Secondary | ICD-10-CM | POA: Diagnosis not present

## 2023-10-20 DIAGNOSIS — Z7985 Long-term (current) use of injectable non-insulin antidiabetic drugs: Secondary | ICD-10-CM | POA: Diagnosis not present

## 2023-10-20 DIAGNOSIS — E1122 Type 2 diabetes mellitus with diabetic chronic kidney disease: Secondary | ICD-10-CM

## 2023-10-20 DIAGNOSIS — R3915 Urgency of urination: Secondary | ICD-10-CM | POA: Diagnosis not present

## 2023-10-20 DIAGNOSIS — N186 End stage renal disease: Secondary | ICD-10-CM | POA: Diagnosis not present

## 2023-10-20 DIAGNOSIS — E1142 Type 2 diabetes mellitus with diabetic polyneuropathy: Secondary | ICD-10-CM | POA: Diagnosis not present

## 2023-10-20 DIAGNOSIS — Z992 Dependence on renal dialysis: Secondary | ICD-10-CM | POA: Diagnosis not present

## 2023-10-20 DIAGNOSIS — Z0000 Encounter for general adult medical examination without abnormal findings: Secondary | ICD-10-CM

## 2023-10-20 LAB — POCT GLYCOSYLATED HEMOGLOBIN (HGB A1C): Hemoglobin A1C: 4.8 % (ref 4.0–5.6)

## 2023-10-20 LAB — GLUCOSE, CAPILLARY: Glucose-Capillary: 84 mg/dL (ref 70–99)

## 2023-10-20 NOTE — Assessment & Plan Note (Signed)
 Patient notes wound on her right groin that began a couple days ago.  She denies any fever, discharge, or other signs or symptoms.  Photo available in media tab.  This does not appear to be an abscess and there is no associated drainage.  This is likely a skin breakdown as this is in an area of high friction.  Will encourage her to cover with wraps that are free-flowing to her skin.

## 2023-10-20 NOTE — Assessment & Plan Note (Signed)
 Last A1c 5 months ago was 4.4.  She is only on Mounjaro  and has been tolerating this medication well with no side effects.  She currently has an ophthalmologist and was encouraged to follow-up with them as she is due for her annual visit.  No other concerns at this time. - Follow-up A1c - Continue Mounjaro 

## 2023-10-20 NOTE — Progress Notes (Signed)
 CC: Chronic condition follow-up  HPI: [name] [name] is a 48 y.o. female living with a history stated below and presents today for chronic condition follow-up. Please see problem based assessment and plan for additional details.  Past Medical History:  Diagnosis Date   Abdominal muscle pain 09/08/2016   Abnormal Pap smear of cervix 2009   Abscess    history of multiple abscesses   Acute bilateral low back pain 02/13/2017   Acute blood loss anemia    Hx  Iron  infusions   Acute on chronic renal failure (HCC) 07/12/2012   Acute renal failure (HCC) 07/12/2012   Acute respiratory failure with hypoxia (HCC) 10/21/2019   Adjustment disorder with depressed mood 03/26/2017   AKI (acute kidney injury) (HCC)    Dialysis t-th-s   Anemia 10/21/2019   Anemia of chronic disease 2002   Anginal pain (HCC)    Anxiety    Panic attacks   Bilateral lower extremity edema 05/13/2016   Bipolar disorder (HCC)    Carpal tunnel syndrome, bilateral 01/25/2012   Cataract    B/L cataract   Cellulitis 05/21/2014   right eye   CHF (congestive heart failure) (HCC)    Chronic bronchitis (HCC)    "get it q yr" (05/13/2013)   Chronic pain    Chronic pain of right knee 09/08/2016   Crutches as ambulation aid    uses forearm crutchs, Left  AKA with prosthetic.   Dehiscence of amputation stump (HCC)    Depression    Edema of lower extremity    Endocarditis 2002   subacute bacterial endocarditis.    Fall    Family history of anesthesia complication    "my mom has a hard time coming out from under"   Fibromyalgia    GERD (gastroesophageal reflux disease)    occ   Heart murmur    never has had any problems   Herpes simplex type 1 infection 01/16/2018   Hidradenitis suppurativa 02/24/2021   History of blood transfusion    "just low blood count" (05/13/2013)   History of sexually transmitted infection    Hyperlipidemia    Hypertension    Hypothyroidism    Hypothyroidism, adult 03/21/2014    Hypoxia    Leukocytosis    Muscle spasm 12/16/2020   Necrosis (HCC)    and ulceration   Necrotizing fasciitis s/p OR debridements 07/06/2012   Obesity    OSA on CPAP    does not use CPAP   Peripheral neuropathy    Pneumonia    Pyelonephritis 12/31/2020   Routine adult health maintenance 12/08/2016   Severe protein-calorie malnutrition (HCC)    Status post below knee amputation, left (HCC) 04/11/2017   Type II diabetes mellitus (HCC)    Type  II   Vaginal irritation 03/14/2016   Weak pulse 07/21/2021   Wound dehiscence 09/04/2019    Current Outpatient Medications on File Prior to Visit  Medication Sig Dispense Refill   allopurinol  (ZYLOPRIM ) 100 MG tablet Take 1 tablet by mouth 3 times a week. Take only after dialysis sessions. Do not take on non-dialysis days. 30 tablet 1   amLODipine  (NORVASC ) 10 MG tablet Take 1 tablet by mouth daily. 90 tablet 1   aspirin  81 MG chewable tablet Chew 1 tablet (81 mg total) by mouth daily. 90 tablet 0   atorvastatin  (LIPITOR) 80 MG tablet Take 1 tablet (80 mg total) by mouth daily. 30 tablet 0   Benzoyl Peroxide  2.5 % CREA Apply 1 drop topically  daily. 21 g 0   blood glucose meter kit and supplies KIT ICD 10- E11.69. Based on patient and insurance preference. Use up to four times daily as directed. 1 each 0   buPROPion  (WELLBUTRIN ) 100 MG tablet Take 1 tablet (100 mg total) by mouth every other day. 30 tablet 1   diclofenac  Sodium (VOLTAREN ) 1 % GEL APPLY 1 GRAM TOPICALLY TO AFFECTED AREA 4 TIMES DAILY AS NEEDED (Patient taking differently: Apply 1 g topically 4 (four) times daily as needed (for pain).) 100 g 2   fluticasone  (FLONASE ) 50 MCG/ACT nasal spray Place 1 spray into both nostrils daily. (Patient taking differently: Place 1 spray into both nostrils daily as needed for allergies.) 11.1 mL 2   furosemide  (LASIX ) 20 MG tablet Take 1 tablet by mouth daily. Take on non-dialysis days. Adjust per kidney docs (Patient taking differently: Take 20 mg  by mouth daily. Take on non-dialysis days. Adjust per kidney docs) 90 tablet 1   hydrOXYzine  (ATARAX ) 25 MG tablet Take 1 tablet (25 mg total) by mouth 3 (three) times daily as needed for anxiety. 90 tablet 0   Insulin  Pen Needle (TRUEPLUS PEN NEEDLES) 31G X 5 MM MISC 1 each by Other route 3 (three) times daily. 100 each 5   ketoconazole  (NIZORAL ) 2 % cream Apply 1 application topically to affected area daily. 15 g 2   levothyroxine  (SYNTHROID ) 88 MCG tablet Take 1 tablet (88 mcg total) by mouth daily.     metoprolol  succinate (TOPROL -XL) 25 MG 24 hr tablet Take 1 tablet (25 mg total) by mouth daily. 90 tablet 1   MOUNJARO  15 MG/0.5ML Pen Inject 15 mg below the skin once a week 2 mL 11   ofloxacin  (OCUFLOX ) 0.3 % ophthalmic solution Place 1 drop into the left eye in the morning and at bedtime.     oxyCODONE -acetaminophen  (PERCOCET) 7.5-325 MG tablet Take 1 tablet by mouth every 8 (eight) hours as needed for moderate pain.     sevelamer  carbonate (RENVELA ) 800 MG tablet Take 6 to 7 tablets by mouth twice a day with meals     tretinoin  (RETIN-A ) 0.025 % cream Apply topically to affected area at bedtime 45 g 0   Vitamin D , Ergocalciferol , (DRISDOL ) 1.25 MG (50000 UNIT) CAPS capsule Take 1 capsule (50,000 Units total) by mouth every 7 (seven) days. 9 capsule 0   No current facility-administered medications on file prior to visit.    Family History  Problem Relation Age of Onset   Heart failure Mother    Diabetes Mother    Kidney disease Mother    Kidney disease Father    Diabetes Father    Diabetes Paternal Grandmother    Heart failure Paternal Grandmother    Other Other     Social History   Socioeconomic History   Marital status: Single    Spouse name: Not on file   Number of children: 0   Years of education: 11   Highest education level: Not on file  Occupational History   Occupation: disability  Tobacco Use   Smoking status: Former    Current packs/day: 0.00    Average  packs/day: 0.5 packs/day for 16.0 years (8.0 ttl pk-yrs)    Types: Cigarettes, E-cigarettes    Start date: 08/2005    Quit date: 08/2021    Years since quitting: 2.1   Smokeless tobacco: Never  Vaping Use   Vaping status: Some Days   Substances: Nicotine , THC, CBD, Flavoring   Devices: CBD  oil  Substance and Sexual Activity   Alcohol use: Yes    Alcohol/week: 0.0 standard drinks of alcohol    Comment: social   Drug use: Not Currently    Types: Marijuana   Sexual activity: Yes    Birth control/protection: Condom  Other Topics Concern   Not on file  Social History Narrative   Lives with mother currently, goddaughter and uncle in a one story home.     On disability for diabetes, neuropathy, sleep apnea etc.  Disability since 2014.    Education: 11th grade.    Social Drivers of Health   Financial Resource Strain: High Risk (12/28/2022)   Overall Financial Resource Strain (CARDIA)    Difficulty of Paying Living Expenses: Hard  Food Insecurity: Food Insecurity Present (12/28/2022)   Hunger Vital Sign    Worried About Running Out of Food in the Last Year: Often true    Ran Out of Food in the Last Year: Often true  Transportation Needs: Unmet Transportation Needs (12/28/2022)   PRAPARE - Administrator, Civil Service (Medical): Yes    Lack of Transportation (Non-Medical): Yes  Physical Activity: Inactive (12/28/2022)   Exercise Vital Sign    Days of Exercise per Week: 0 days    Minutes of Exercise per Session: 0 min  Stress: No Stress Concern Present (12/28/2022)   [employer] of Occupational Health - Occupational Stress Questionnaire    Feeling of Stress : Only a little  Social Connections: Moderately Isolated (12/28/2022)   Social Connection and Isolation Panel [NHANES]    Frequency of Communication with Friends and Family: More than three times a week    Frequency of Social Gatherings with Friends and Family: Once a week    Attends Religious Services: 1 to 4 times per  year    Active Member of [employer] or Organizations: No    Attends Banker Meetings: Never    Marital Status: Widowed  Intimate Partner Violence: Not At Risk (12/28/2022)   Humiliation, Afraid, Rape, and Kick questionnaire    Fear of Current or Ex-Partner: No    Emotionally Abused: No    Physically Abused: No    Sexually Abused: No    Review of Systems: ROS negative except for what is noted on the assessment and plan.  Vitals:   10/20/23 0855 10/20/23 0901  BP: (!) 140/69 111/67  Pulse: 86 84  Temp: 98 F (36.7 C)   TempSrc: Oral   SpO2: 100%   Weight: 219 lb (99.3 kg)   Height: 5' 9.5" (1.765 m)     Physical Exam: Constitutional: well-appearing in no acute distress HENT: normocephalic atraumatic, mucous membranes moist Eyes: conjunctiva non-erythematous Neck: supple Cardiovascular: regular rate and rhythm, no m/r/g Pulmonary/Chest: normal work of breathing on room air, lungs clear to auscultation bilaterally Abdominal: soft, non-tender, non-distended MSK: normal bulk and tone Neurological: alert & oriented x 3 Skin: warm and dry  Assessment & Plan:   ESRD (end stage renal disease) (HCC) Patient notes that she has urge to urinate but cannot.  She currently has ESRD but still produces a mild amount of urine.  She notes that this has been an ongoing issue.  There has been no increase or decrease in the amount of urine she is producing.  She also denies any pain with urination, incontinence, or suprapubic tenderness. - Follow-up UA  Skin breakdown Patient notes wound on her right groin that began a couple days ago.  She denies any fever,  discharge, or other signs or symptoms.  Photo available in media tab.  This does not appear to be an abscess and there is no associated drainage.  This is likely a skin breakdown as this is in an area of high friction.  Will encourage her to cover with wraps that are free-flowing to her skin.  Healthcare maintenance Referral for  colonoscopy   Diabetic polyneuropathy associated with type 2 diabetes mellitus (HCC) Last A1c 5 months ago was 4.4.  She is only on Mounjaro  and has been tolerating this medication well with no side effects.  She currently has an ophthalmologist and was encouraged to follow-up with them as she is due for her annual visit.  No other concerns at this time. - Follow-up A1c - Continue Mounjaro   Patient discussed with Dr. Brigitte Canard, MD  [facility] Internal Medicine, PGY-1 Date 10/20/2023 Time 10:03 AM

## 2023-10-20 NOTE — Assessment & Plan Note (Signed)
 Patient notes that she has urge to urinate but cannot.  She currently has ESRD but still produces a mild amount of urine.  She notes that this has been an ongoing issue.  There has been no increase or decrease in the amount of urine she is producing.  She also denies any pain with urination, incontinence, or suprapubic tenderness. - Follow-up UA

## 2023-10-20 NOTE — Assessment & Plan Note (Signed)
 Referral for colonoscopy

## 2023-10-20 NOTE — Patient Instructions (Signed)
 Thank you so much for coming to the clinic today!   I have put in a referral for colonoscopy.  You can expect a call in the next couple of weeks.  I will also send a message regarding the results of your urinalysis.  For the skin breakdown in your leg, you can use wrappings to cover this that will allow her to flow freely to her skin.  We will see you in 3 months.  If you have any questions please feel free to the call the clinic at anytime at [phone]. It was a pleasure seeing you!  Best, Dr. Carolee Churchman

## 2023-10-21 DIAGNOSIS — E1122 Type 2 diabetes mellitus with diabetic chronic kidney disease: Secondary | ICD-10-CM | POA: Diagnosis not present

## 2023-10-21 DIAGNOSIS — R197 Diarrhea, unspecified: Secondary | ICD-10-CM | POA: Diagnosis not present

## 2023-10-21 DIAGNOSIS — R52 Pain, unspecified: Secondary | ICD-10-CM | POA: Diagnosis not present

## 2023-10-21 DIAGNOSIS — D631 Anemia in chronic kidney disease: Secondary | ICD-10-CM | POA: Diagnosis not present

## 2023-10-21 DIAGNOSIS — L299 Pruritus, unspecified: Secondary | ICD-10-CM | POA: Diagnosis not present

## 2023-10-21 DIAGNOSIS — E8770 Fluid overload, unspecified: Secondary | ICD-10-CM | POA: Diagnosis not present

## 2023-10-21 DIAGNOSIS — N2581 Secondary hyperparathyroidism of renal origin: Secondary | ICD-10-CM | POA: Diagnosis not present

## 2023-10-21 DIAGNOSIS — N186 End stage renal disease: Secondary | ICD-10-CM | POA: Diagnosis not present

## 2023-10-21 DIAGNOSIS — T7840XA Allergy, unspecified, initial encounter: Secondary | ICD-10-CM | POA: Diagnosis not present

## 2023-10-21 DIAGNOSIS — D509 Iron deficiency anemia, unspecified: Secondary | ICD-10-CM | POA: Diagnosis not present

## 2023-10-21 DIAGNOSIS — Z992 Dependence on renal dialysis: Secondary | ICD-10-CM | POA: Diagnosis not present

## 2023-10-21 DIAGNOSIS — D689 Coagulation defect, unspecified: Secondary | ICD-10-CM | POA: Diagnosis not present

## 2023-10-21 LAB — UA/M W/RFLX CULTURE, ROUTINE
Bilirubin, UA: NEGATIVE
Glucose, UA: NEGATIVE
Ketones, UA: NEGATIVE
Leukocytes,UA: NEGATIVE
Nitrite, UA: NEGATIVE
RBC, UA: NEGATIVE
Specific Gravity, UA: 1.014 (ref 1.005–1.030)
Urobilinogen, Ur: 0.2 mg/dL (ref 0.2–1.0)
pH, UA: >=9 — AB (ref 5.0–7.5)

## 2023-10-21 LAB — MICROSCOPIC EXAMINATION
Bacteria, UA: NONE SEEN
Casts: NONE SEEN /LPF
Epithelial Cells (non renal): >10 /HPF — AB (ref 0–10)

## 2023-10-21 NOTE — Progress Notes (Signed)
 Internal Medicine Clinic Attending  Case discussed with the resident at the time of the visit.  We reviewed the resident's history and exam and pertinent patient test results.  I agree with the assessment, diagnosis, and plan of care documented in the resident's note.

## 2023-10-23 DIAGNOSIS — R2681 Unsteadiness on feet: Secondary | ICD-10-CM | POA: Diagnosis not present

## 2023-10-23 DIAGNOSIS — M6281 Muscle weakness (generalized): Secondary | ICD-10-CM | POA: Diagnosis not present

## 2023-10-23 DIAGNOSIS — R262 Difficulty in walking, not elsewhere classified: Secondary | ICD-10-CM | POA: Diagnosis not present

## 2023-10-23 DIAGNOSIS — M25561 Pain in right knee: Secondary | ICD-10-CM | POA: Diagnosis not present

## 2023-10-24 ENCOUNTER — Ambulatory Visit: Payer: Self-pay | Admitting: Student

## 2023-10-24 DIAGNOSIS — D631 Anemia in chronic kidney disease: Secondary | ICD-10-CM | POA: Diagnosis not present

## 2023-10-24 DIAGNOSIS — D689 Coagulation defect, unspecified: Secondary | ICD-10-CM | POA: Diagnosis not present

## 2023-10-24 DIAGNOSIS — Z992 Dependence on renal dialysis: Secondary | ICD-10-CM | POA: Diagnosis not present

## 2023-10-24 DIAGNOSIS — N186 End stage renal disease: Secondary | ICD-10-CM | POA: Diagnosis not present

## 2023-10-24 DIAGNOSIS — D509 Iron deficiency anemia, unspecified: Secondary | ICD-10-CM | POA: Diagnosis not present

## 2023-10-24 DIAGNOSIS — N2581 Secondary hyperparathyroidism of renal origin: Secondary | ICD-10-CM | POA: Diagnosis not present

## 2023-10-24 NOTE — Progress Notes (Signed)
 Sent patient MyChart message noting stable labs.

## 2023-10-25 ENCOUNTER — Ambulatory Visit (HOSPITAL_COMMUNITY)
Admission: RE | Admit: 2023-10-25 | Discharge: 2023-10-25 | Disposition: A | Discharge status: Home / Self Care | Attending: Vascular Surgery | Admitting: Vascular Surgery

## 2023-10-25 ENCOUNTER — Other Ambulatory Visit: Payer: Self-pay

## 2023-10-25 ENCOUNTER — Encounter (HOSPITAL_COMMUNITY)
Admission: RE | Disposition: A | Discharge status: Home / Self Care | Payer: Self-pay | Source: Home / Self Care | Attending: Vascular Surgery

## 2023-10-25 DIAGNOSIS — N186 End stage renal disease: Secondary | ICD-10-CM

## 2023-10-25 DIAGNOSIS — T82858A Stenosis of vascular prosthetic devices, implants and grafts, initial encounter: Secondary | ICD-10-CM | POA: Diagnosis not present

## 2023-10-25 DIAGNOSIS — Z87891 Personal history of nicotine dependence: Secondary | ICD-10-CM | POA: Insufficient documentation

## 2023-10-25 DIAGNOSIS — Y832 Surgical operation with anastomosis, bypass or graft as the cause of abnormal reaction of the patient, or of later complication, without mention of misadventure at the time of the procedure: Secondary | ICD-10-CM | POA: Insufficient documentation

## 2023-10-25 DIAGNOSIS — T82898A Other specified complication of vascular prosthetic devices, implants and grafts, initial encounter: Secondary | ICD-10-CM | POA: Diagnosis not present

## 2023-10-25 DIAGNOSIS — E1122 Type 2 diabetes mellitus with diabetic chronic kidney disease: Secondary | ICD-10-CM | POA: Insufficient documentation

## 2023-10-25 DIAGNOSIS — Z992 Dependence on renal dialysis: Secondary | ICD-10-CM

## 2023-10-25 DIAGNOSIS — I132 Hypertensive heart and chronic kidney disease with heart failure and with stage 5 chronic kidney disease, or end stage renal disease: Secondary | ICD-10-CM | POA: Diagnosis not present

## 2023-10-25 DIAGNOSIS — T82510A Breakdown (mechanical) of surgically created arteriovenous fistula, initial encounter: Secondary | ICD-10-CM | POA: Insufficient documentation

## 2023-10-25 DIAGNOSIS — Z89612 Acquired absence of left leg above knee: Secondary | ICD-10-CM | POA: Diagnosis not present

## 2023-10-25 DIAGNOSIS — I509 Heart failure, unspecified: Secondary | ICD-10-CM | POA: Insufficient documentation

## 2023-10-25 HISTORY — PX: A/V SHUNT INTERVENTION: CATH118220

## 2023-10-25 HISTORY — PX: VENOUS ANGIOPLASTY: CATH118376

## 2023-10-25 LAB — GLUCOSE, CAPILLARY: Glucose-Capillary: 87 mg/dL (ref 70–99)

## 2023-10-25 SURGERY — A/V SHUNT INTERVENTION
Anesthesia: LOCAL

## 2023-10-25 MED ORDER — LIDOCAINE HCL (PF) 1 % IJ SOLN
INTRAMUSCULAR | Status: DC | PRN
Start: 2023-10-25 — End: 2023-10-25
  Administered 2023-10-25: 5 mL

## 2023-10-25 MED ORDER — IODIXANOL 320 MG/ML IV SOLN
INTRAVENOUS | Status: DC | PRN
Start: 2023-10-25 — End: 2023-10-25
  Administered 2023-10-25: 35 mL via INTRAVENOUS

## 2023-10-25 MED ORDER — HEPARIN (PORCINE) IN NACL 1000-0.9 UT/500ML-% IV SOLN
INTRAVENOUS | Status: DC | PRN
Start: 2023-10-25 — End: 2023-10-25
  Administered 2023-10-25: 500 mL

## 2023-10-25 MED ORDER — LIDOCAINE HCL (PF) 1 % IJ SOLN
INTRAMUSCULAR | Status: AC
  Filled 2023-10-25: qty 30

## 2023-10-25 SURGICAL SUPPLY — 8 items
BALLOON MUSTANG 6.0X40 75 (BALLOONS) IMPLANT
BALLOON MUSTANG 8X80X75 (BALLOONS) IMPLANT
KIT ENCORE 40 (KITS) IMPLANT
KIT MICROPUNCTURE NIT STIFF (SHEATH) IMPLANT
SET MICROPUNCTURE 5F STIFF (MISCELLANEOUS) IMPLANT
SHEATH PINNACLE R/O II 6F 4CM (SHEATH) IMPLANT
TRAY PV CATH (CUSTOM PROCEDURE TRAY) ×3 IMPLANT
WIRE BENTSON .035X145CM (WIRE) IMPLANT

## 2023-10-25 NOTE — Op Note (Signed)
    Patient name: [name] [name] MRN: [id number] DOB: 22-May-1976 Sex: female  10/25/2023 Pre-operative Diagnosis: ESRD on HD with low flows during sessions Post-operative diagnosis:  Same Surgeon:  Philipp Brawn, MD Procedure Performed:  Ultrasound-guided access of left arm AV fistula Central venogram and fistulogram Balloon angioplasty of in-stent restenosis at the cephalic arch Balloon angioplasty of the fistula near the anastomosis   Indications: [name] is a 48 year old female with history of ESRD who presented to the HD access center for fistulogram.  She was having issues with low flows and low clearance during HD sessions.  Her last HD session was yesterday.  Risks and benefits of fistulogram with intervention were reviewed, she expressed understanding and elected to proceed.  Findings:  Widely patent central venous system.  There is an approximate 60% in-stent restenosis of the cephalic arch stent.  Aneurysmal segment in the mid fistula.  Approximate 40% stenosis of the proximal fistula a few centimeters distal to the anastomosis.  Widely patent anastomosis.   Procedure:  The patient was identified in the holding area and taken to the cath lab  The patient was then placed supine on the table and prepped and draped in the usual sterile fashion.  A time out was called.  Ultrasound was used to evaluate the left arm AV access. This was accessed under u/s guidance. An 018 wire was advanced without resistance, a micropuncture sheath was placed and fistulagram obtained which demonstrated the above findings.  This access was then upsized to a 6 F short sheath over a Bentson wire.  The Bentson wire was then passed through the previously placed stent and into the central system.  The in-stent restenosis was treated with an 8 mm x 80 mm Mustang balloon.  While this was up I obtained a retrograde picture to visualize the anastomosis which demonstrated the focal 40% stenosis in the proximal  fistula.  The Mustang balloon was let down and an angiogram demonstrated an excellent result with less than 20% residual stenosis and brisk flow through the stent.  The 6 French sheath was then slightly pulled back and the proximal fistula stenosis was treated with a 6 mm x 40 mm Mustang balloon.  Completion angiogram demonstrated an adequate result and there was a improvement in the thrill.  Contrast: 35 cc  Impression: In-stent restenosis of the cephalic arch treated with an 8 x 80 Mustang balloon and mild proximal stenosis treated with a 6 x 40 Mustang.  Improvement in thrill.  At next intervention if the proximal fistula stenosis is worse this can be treated with a larger balloon.   Philipp Brawn MD [facility] Office: [phone]

## 2023-10-25 NOTE — H&P (Signed)
 HD ACCESS CENTER H&P   Patient ID: [name] [name], female   DOB: 1975/12/30, 48 y.o.   MRN: [id number]  Subjective:     HPI [name] [name] is a 48 y.o. female with ESRD presenting to the HD access center for intervention.  Past Medical History:  Diagnosis Date   Abdominal muscle pain 09/08/2016   Abnormal Pap smear of cervix 2009   Abscess    history of multiple abscesses   Acute bilateral low back pain 02/13/2017   Acute blood loss anemia    Hx  Iron  infusions   Acute on chronic renal failure (HCC) 07/12/2012   Acute renal failure (HCC) 07/12/2012   Acute respiratory failure with hypoxia (HCC) 10/21/2019   Adjustment disorder with depressed mood 03/26/2017   AKI (acute kidney injury) (HCC)    Dialysis t-th-s   Anemia 10/21/2019   Anemia of chronic disease 2002   Anginal pain (HCC)    Anxiety    Panic attacks   Bilateral lower extremity edema 05/13/2016   Bipolar disorder (HCC)    Carpal tunnel syndrome, bilateral 01/25/2012   Cataract    B/L cataract   Cellulitis 05/21/2014   right eye   CHF (congestive heart failure) (HCC)    Chronic bronchitis (HCC)    "get it q yr" (05/13/2013)   Chronic pain    Chronic pain of right knee 09/08/2016   Crutches as ambulation aid    uses forearm crutchs, Left  AKA with prosthetic.   Dehiscence of amputation stump (HCC)    Depression    Edema of lower extremity    Endocarditis 2002   subacute bacterial endocarditis.    Fall    Family history of anesthesia complication    "my mom has a hard time coming out from under"   Fibromyalgia    GERD (gastroesophageal reflux disease)    occ   Heart murmur    never has had any problems   Herpes simplex type 1 infection 01/16/2018   Hidradenitis suppurativa 02/24/2021   History of blood transfusion    "just low blood count" (05/13/2013)   History of sexually transmitted infection    Hyperlipidemia    Hypertension    Hypothyroidism    Hypothyroidism, adult 03/21/2014    Hypoxia    Leukocytosis    Muscle spasm 12/16/2020   Necrosis (HCC)    and ulceration   Necrotizing fasciitis s/p OR debridements 07/06/2012   Obesity    OSA on CPAP    does not use CPAP   Peripheral neuropathy    Pneumonia    Pyelonephritis 12/31/2020   Routine adult health maintenance 12/08/2016   Severe protein-calorie malnutrition (HCC)    Status post below knee amputation, left (HCC) 04/11/2017   Type II diabetes mellitus (HCC)    Type  II   Vaginal irritation 03/14/2016   Weak pulse 07/21/2021   Wound dehiscence 09/04/2019   Family History  Problem Relation Age of Onset   Heart failure Mother    Diabetes Mother    Kidney disease Mother    Kidney disease Father    Diabetes Father    Diabetes Paternal Grandmother    Heart failure Paternal Grandmother    Other Other    Past Surgical History:  Procedure Laterality Date   A/V FISTULAGRAM Left 05/12/2021   Procedure: A/V FISTULAGRAM;  Surgeon: Kayla Part, MD;  Location: [facility] INVASIVE CV LAB;  Service: Cardiovascular;  Laterality: Left;   ABDOMINAL AORTOGRAM W/LOWER EXTREMITY Left 03/19/2019  Procedure: ABDOMINAL AORTOGRAM W/LOWER EXTREMITY;  Surgeon: Margherita Shell, MD;  Location: [facility] INVASIVE CV LAB;  Service: Cardiovascular;  Laterality: Left;   ABOVE KNEE LEG AMPUTATION Left 10/11/2019   wound dehisence    AIR/FLUID EXCHANGE Right 12/31/2019   Procedure: AIR/FLUID EXCHANGE;  Surgeon: Jearline Minder, MD;  Location: [facility] OR;  Service: Ophthalmology;  Laterality: Right;   AMPUTATION Left 04/07/2017   Procedure: LEFT BELOW KNEE AMPUTATION;  Surgeon: Timothy Ford, MD;  Location: [facility] OR;  Service: Orthopedics;  Laterality: Left;   AMPUTATION Left 10/11/2019   Procedure: LEFT ABOVE KNEE AMPUTATION;  Surgeon: Timothy Ford, MD;  Location: [facility] OR;  Service: Orthopedics;  Laterality: Left;   AV FISTULA PLACEMENT Left 01/05/2021   Procedure: ARTERIOVENOUS (AV) FISTULA CREATION vs. GRAFT;  Surgeon: Dannis Dy,  MD;  Location: [facility] OR;  Service: Vascular;  Laterality: Left;   CATARACT EXTRACTION W/PHACO Right 02/11/2020   Procedure: CATARACT EXTRACTION PHACO AND INTRAOCULAR LENS PLACEMENT (IOC);  Surgeon: Jearline Minder, MD;  Location: [facility] OR;  Service: Ophthalmology;  Laterality: Right;   EYE SURGERY     lazer   FISTULA SUPERFICIALIZATION Left 02/23/2021   Procedure: SUPERFICIALIZATION OF LEFT BRACHIOCEPHALIC FISTULA;  Surgeon: Dannis Dy, MD;  Location: [facility] OR;  Service: Vascular;  Laterality: Left;   GAS INSERTION Right 12/31/2019   Procedure: INSERTION OF GAS;  Surgeon: Jearline Minder, MD;  Location: [facility] OR;  Service: Ophthalmology;  Laterality: Right;   INCISION AND DRAINAGE ABSCESS     multiple I&Ds   INCISION AND DRAINAGE ABSCESS Left 07/09/2012   Procedure: DRESSING CHANGE, THIGH WOUND;  Surgeon: Rogena Class, MD;  Location: [facility] OR;  Service: General;  Laterality: Left;   INCISION AND DRAINAGE OF WOUND Left 07/07/2012   Procedure: IRRIGATION AND DEBRIDEMENT WOUND;  Surgeon: Rogena Class, MD;  Location: [facility] OR;  Service: General;  Laterality: Left;   INCISION AND DRAINAGE PERIRECTAL ABSCESS Left 07/14/2012   Procedure: DEBRIDEMENT OF SKIN & SOFT TISSUE; DRESSING CHANGE UNDER ANESTHESIA;  Surgeon: Fran Imus, MD,FACS;  Location: [facility] OR;  Service: General;  Laterality: Left;   INCISION AND DRAINAGE PERIRECTAL ABSCESS Left 07/16/2012   Procedure: I&D Left Thigh;  Surgeon: Diantha Fossa, MD;  Location: [facility] OR;  Service: General;  Laterality: Left;   INCISION AND DRAINAGE PERIRECTAL ABSCESS N/A 01/05/2015   Procedure: IRRIGATION AND DEBRIDEMENT PERIRECTAL ABSCESS;  Surgeon: Dareen Ebbing, MD;  Location: [facility] OR;  Service: General;  Laterality: N/A;   INSERTION OF DIALYSIS CATHETER Right 01/05/2021   Procedure: INSERTION OF DIALYSIS CATHETER;  Surgeon: Dannis Dy, MD;  Location: [facility] OR;  Service: Vascular;  Laterality: Right;   IRRIGATION AND DEBRIDEMENT ABSCESS Left  07/06/2012   Procedure: IRRIGATION AND DEBRIDEMENT ABSCESS BUTTOCKS AND THIGH;  Surgeon: Thayne Fine, MD;  Location: [facility] OR;  Service: General;  Laterality: Left;   IRRIGATION AND DEBRIDEMENT ABSCESS Left 08/10/2012   Procedure: IRRIGATION AND DEBRIDEMENT ABSCESS;  Surgeon: Evander Hills, DO;  Location: [facility] OR;  Service: General;  Laterality: Left;   LEFT HEART CATH AND CORONARY ANGIOGRAPHY N/A 10/31/2022   Procedure: LEFT HEART CATH AND CORONARY ANGIOGRAPHY;  Surgeon: Millicent Ally, MD;  Location: [facility] INVASIVE CV LAB;  Service: Cardiovascular;  Laterality: N/A;   MEMBRANE PEEL Left 07/24/2022   Procedure: MEMBRANE PEEL;  Surgeon: Jearline Minder, MD;  Location: [facility] OR;  Service: Ophthalmology;  Laterality: Left;   PARS PLANA VITRECTOMY Right 12/31/2019   Procedure: PARS PLANA VITRECTOMY WITH 25  GAUGE;  Surgeon: Jearline Minder, MD;  Location: [facility] OR;  Service: Ophthalmology;  Laterality: Right;   PARS PLANA VITRECTOMY Left 07/24/2022   Procedure: PARS PLANA VITRECTOMY WITH 25 GAUGE;  Surgeon: Jearline Minder, MD;  Location: [facility] OR;  Service: Ophthalmology;  Laterality: Left;   PERIPHERAL VASCULAR INTERVENTION Left 05/12/2021   Procedure: PERIPHERAL VASCULAR INTERVENTION;  Surgeon: Kayla Part, MD;  Location: [facility] INVASIVE CV LAB;  Service: Cardiovascular;  Laterality: Left;  arm fistula   PHOTOCOAGULATION Left 07/24/2022   Procedure: AIRGAS, SILICONE OIL, PHOTOCOAGULATION;  Surgeon: Jearline Minder, MD;  Location: [facility] OR;  Service: Ophthalmology;  Laterality: Left;   PHOTOCOAGULATION WITH LASER Right 12/31/2019   Procedure: PHOTOCOAGULATION WITH LASER;  Surgeon: Jearline Minder, MD;  Location: [facility] OR;  Service: Ophthalmology;  Laterality: Right;   REPAIR OF COMPLEX TRACTION RETINAL DETACHMENT Right 12/31/2019   Procedure: REPAIR OF COMPLEX TRACTION RETINAL;  Surgeon: Jearline Minder, MD;  Location: [facility] OR;  Service: Ophthalmology;  Laterality: Right;   REPAIR OF COMPLEX TRACTION RETINAL DETACHMENT Left  07/24/2022   Procedure: REPAIR OF COMPLEX TRACTION RETINAL DETACHMENT;  Surgeon: Jearline Minder, MD;  Location: [facility] OR;  Service: Ophthalmology;  Laterality: Left;   STUMP REVISION Left 06/09/2017   Procedure: REVISION LEFT BELOW KNEE AMPUTATION;  Surgeon: Timothy Ford, MD;  Location: [facility] OR;  Service: Orthopedics;  Laterality: Left;   STUMP REVISION Left 09/04/2019   Procedure: LEFT BELOW KNEE AMPUTATION REVISION;  Surgeon: Timothy Ford, MD;  Location: [facility] OR;  Service: Orthopedics;  Laterality: Left;   STUMP REVISION Left 09/20/2019   Procedure: LEFT BELOW KNEE AMPUTATION REVISION;  Surgeon: Timothy Ford, MD;  Location: [facility] OR;  Service: Orthopedics;  Laterality: Left;   TONSILLECTOMY  1994    Short Social History:  Social History   Tobacco Use   Smoking status: Former    Current packs/day: 0.00    Average packs/day: 0.5 packs/day for 16.0 years (8.0 ttl pk-yrs)    Types: Cigarettes, E-cigarettes    Start date: 08/2005    Quit date: 08/2021    Years since quitting: 2.1   Smokeless tobacco: Never  Substance Use Topics   Alcohol use: Yes    Alcohol/week: 0.0 standard drinks of alcohol    Comment: social    Allergies  Allergen Reactions   Vancomycin  Other (See Comments)    Acute renal failure suspected secondary to vanco    No current facility-administered medications for this encounter.    REVIEW OF SYSTEMS All other systems were reviewed and are negative     Objective:   Objective   Vitals:   10/25/23 1017 10/25/23 1153  BP: 114/71   Pulse: 77   Resp: 12   Temp: 97.6 F (36.4 C)   TempSrc: Oral   SpO2: 100% 99%   There is no height or weight on file to calculate BMI.  Physical Exam General: no acute distress Cardiac: hemodynamically stable Extremities: Palpable thrill in proximal part of left arm fistula, moderate aneurysmal segment with decreased thrill distal to the segment.  Data: Reviewed Dr. Rosalva Comber fistulogram from 2022     Assessment/Plan:    Anzley Dibbern is a 48 y.o. female with ESRD presenting for fistulogram.  Having issues with low flows and low clearance during HD sessions. Last HD session yesterday. Reviewed risks and benefits of fistulogram with possible intervention and patient agreed to proceed.   Delaney Fearing, MD [facility]

## 2023-10-26 DIAGNOSIS — N186 End stage renal disease: Secondary | ICD-10-CM | POA: Diagnosis not present

## 2023-10-26 DIAGNOSIS — D509 Iron deficiency anemia, unspecified: Secondary | ICD-10-CM | POA: Diagnosis not present

## 2023-10-26 DIAGNOSIS — N2581 Secondary hyperparathyroidism of renal origin: Secondary | ICD-10-CM | POA: Diagnosis not present

## 2023-10-26 DIAGNOSIS — D631 Anemia in chronic kidney disease: Secondary | ICD-10-CM | POA: Diagnosis not present

## 2023-10-26 DIAGNOSIS — Z992 Dependence on renal dialysis: Secondary | ICD-10-CM | POA: Diagnosis not present

## 2023-10-26 DIAGNOSIS — D689 Coagulation defect, unspecified: Secondary | ICD-10-CM | POA: Diagnosis not present

## 2023-10-27 ENCOUNTER — Encounter (HOSPITAL_COMMUNITY): Payer: Self-pay | Admitting: Vascular Surgery

## 2023-10-27 ENCOUNTER — Encounter: Payer: Self-pay | Admitting: Student

## 2023-10-27 DIAGNOSIS — I159 Secondary hypertension, unspecified: Secondary | ICD-10-CM | POA: Diagnosis not present

## 2023-10-27 DIAGNOSIS — M545 Low back pain, unspecified: Secondary | ICD-10-CM | POA: Diagnosis not present

## 2023-10-27 DIAGNOSIS — G894 Chronic pain syndrome: Secondary | ICD-10-CM | POA: Diagnosis not present

## 2023-10-27 DIAGNOSIS — M79643 Pain in unspecified hand: Secondary | ICD-10-CM | POA: Diagnosis not present

## 2023-10-27 DIAGNOSIS — M25519 Pain in unspecified shoulder: Secondary | ICD-10-CM | POA: Diagnosis not present

## 2023-10-27 DIAGNOSIS — Z79891 Long term (current) use of opiate analgesic: Secondary | ICD-10-CM | POA: Diagnosis not present

## 2023-10-28 DIAGNOSIS — N2581 Secondary hyperparathyroidism of renal origin: Secondary | ICD-10-CM | POA: Diagnosis not present

## 2023-10-28 DIAGNOSIS — D689 Coagulation defect, unspecified: Secondary | ICD-10-CM | POA: Diagnosis not present

## 2023-10-28 DIAGNOSIS — D631 Anemia in chronic kidney disease: Secondary | ICD-10-CM | POA: Diagnosis not present

## 2023-10-28 DIAGNOSIS — D509 Iron deficiency anemia, unspecified: Secondary | ICD-10-CM | POA: Diagnosis not present

## 2023-10-28 DIAGNOSIS — N186 End stage renal disease: Secondary | ICD-10-CM | POA: Diagnosis not present

## 2023-10-28 DIAGNOSIS — Z992 Dependence on renal dialysis: Secondary | ICD-10-CM | POA: Diagnosis not present

## 2023-10-30 ENCOUNTER — Other Ambulatory Visit: Payer: Self-pay | Admitting: Student

## 2023-10-30 ENCOUNTER — Telehealth: Payer: Self-pay | Admitting: *Deleted

## 2023-10-30 ENCOUNTER — Ambulatory Visit: Payer: Self-pay

## 2023-10-30 ENCOUNTER — Encounter: Payer: Self-pay | Admitting: Student

## 2023-10-30 DIAGNOSIS — L709 Acne, unspecified: Secondary | ICD-10-CM

## 2023-10-30 DIAGNOSIS — F419 Anxiety disorder, unspecified: Secondary | ICD-10-CM

## 2023-10-30 NOTE — Telephone Encounter (Signed)
 Medication sent to pharmacy

## 2023-10-30 NOTE — Telephone Encounter (Signed)
 No openings until Thursday 11/02/23.  Please call patient to triage her to see if she able to wait until then.

## 2023-10-30 NOTE — Telephone Encounter (Unsigned)
 RTC to patient on 10/30/2023 .  Message left that the Clinics had called. Copied from CRM [id number]. Topic: Clinical - Medical Advice >> Oct 30, 2023 11:04 AM Tisa Forester wrote: Reason for CRM:  patient need to speak with one of the providers regarding she had a urine test done twice about  a month ago and recently and her Alcohol blood levels is  elevated  Patient want to know how is this possible when she do not drink alcohol beverages.  Patient call back number is [phone]

## 2023-10-30 NOTE — Telephone Encounter (Signed)
 RTC to patient message left that we have an available appointment on 11/02/2023.  To call as soon as possible if she would like that appointment.

## 2023-10-30 NOTE — Telephone Encounter (Signed)
 Copied from CRM [id number]. Topic: Clinical - Medical Advice >> Oct 27, 2023  1:05 PM [name] [name] wrote: Reason for CRM: Pain management requesting that she see a counsloer, she has not had any alcohol but her blood alcohol levels are high, and she is not sure why. Please call [phone]

## 2023-10-30 NOTE — Telephone Encounter (Signed)
 FYI Only or Action Required?: FYI only for provider  Patient was last seen in primary care on 10/20/2023 by Maxie Spaniel, MD. Called Nurse Triage reporting Pain and swelling. Symptoms began several days ago. Interventions attempted: OTC medications: Voltaren  Gel, Prescription medications: percocet, and Rest, hydration, or home remedies. Symptoms are: gradually worsening.  Triage Disposition: See PCP When Office is Open (Within 3 Days)  Patient/caregiver understands and will follow disposition?: Unsure  Copied from CRM [id number]. Topic: Clinical - Pink Word Triage >> Oct 30, 2023  9:43 AM Tilton Fontan wrote: Pink Word that prompted transfer to Nurse Triage: Patient called to advise that she's been having persistent pain in her knee. Would say it's about a five. She's out and about and fears that the pain will get worse. She additionally reports that she has swelling in her right ankle. Please assist. Reason for Disposition  [1] MODERATE pain (e.g., interferes with normal activities, limping) AND [2] present > 3 days  Answer Assessment - Initial Assessment Questions 1. LOCATION and RADIATION: "Where is the pain located?"      Right knee  2. QUALITY: "What does the pain feel like?"  (e.g., sharp, dull, aching, burning)     Achy, sharp, burning 3. SEVERITY: "How bad is the pain?" "What does it keep you from doing?"   (Scale 1-10; or mild, moderate, severe)   -  MILD (1-3): doesn't interfere with normal activities    -  MODERATE (4-7): interferes with normal activities (e.g., work or school) or awakens from sleep, limping    -  SEVERE (8-10): excruciating pain, unable to do any normal activities, unable to walk     7/10 4. ONSET: "When did the pain start?" "Does it come and go, or is it there all the time?"     Several days 5. RECURRENT: "Have you had this pain before?" If Yes, ask: "When, and what happened then?"     yes 6. SETTING: "Has there been any recent work, exercise or other activity that  involved that part of the body?"      ambulating 7. AGGRAVATING FACTORS: "What makes the knee pain worse?" (e.g., walking, climbing stairs, running)     Ambulation 8. ASSOCIATED SYMPTOMS: "Is there any swelling or redness of the knee?"     Swelling 9. OTHER SYMPTOMS: "Do you have any other symptoms?" (e.g., chest pain, difficulty breathing, fever, calf pain)     Right ankle swelling 10. Alleviating factors: Percocet and Voltaren  gel with effect.   Additional information/question: UDS at pain management center, resulted as having elevated blood alcohol level. She doesn't drink and would like to know why the level was elevated.   Patient calling back to schedule acute visit, she is not available on Tuesday and Thursdays due to dialysis. Offered Friday but she has pain management appointment this Friday and needs to check on the time, she will call back to schedule acute visit.  Protocols used: Knee Pain-A-AH

## 2023-10-31 ENCOUNTER — Encounter: Payer: Self-pay | Admitting: *Deleted

## 2023-10-31 DIAGNOSIS — N186 End stage renal disease: Secondary | ICD-10-CM | POA: Diagnosis not present

## 2023-10-31 DIAGNOSIS — D509 Iron deficiency anemia, unspecified: Secondary | ICD-10-CM | POA: Diagnosis not present

## 2023-10-31 DIAGNOSIS — D689 Coagulation defect, unspecified: Secondary | ICD-10-CM | POA: Diagnosis not present

## 2023-10-31 DIAGNOSIS — Z992 Dependence on renal dialysis: Secondary | ICD-10-CM | POA: Diagnosis not present

## 2023-10-31 DIAGNOSIS — D631 Anemia in chronic kidney disease: Secondary | ICD-10-CM | POA: Diagnosis not present

## 2023-10-31 DIAGNOSIS — N2581 Secondary hyperparathyroidism of renal origin: Secondary | ICD-10-CM | POA: Diagnosis not present

## 2023-11-02 DIAGNOSIS — Z992 Dependence on renal dialysis: Secondary | ICD-10-CM | POA: Diagnosis not present

## 2023-11-02 DIAGNOSIS — D509 Iron deficiency anemia, unspecified: Secondary | ICD-10-CM | POA: Diagnosis not present

## 2023-11-02 DIAGNOSIS — D631 Anemia in chronic kidney disease: Secondary | ICD-10-CM | POA: Diagnosis not present

## 2023-11-02 DIAGNOSIS — D689 Coagulation defect, unspecified: Secondary | ICD-10-CM | POA: Diagnosis not present

## 2023-11-02 DIAGNOSIS — N2581 Secondary hyperparathyroidism of renal origin: Secondary | ICD-10-CM | POA: Diagnosis not present

## 2023-11-02 DIAGNOSIS — N186 End stage renal disease: Secondary | ICD-10-CM | POA: Diagnosis not present

## 2023-11-03 ENCOUNTER — Ambulatory Visit: Payer: Self-pay | Admitting: *Deleted

## 2023-11-03 DIAGNOSIS — G546 Phantom limb syndrome with pain: Secondary | ICD-10-CM | POA: Diagnosis not present

## 2023-11-03 DIAGNOSIS — G894 Chronic pain syndrome: Secondary | ICD-10-CM | POA: Diagnosis not present

## 2023-11-03 DIAGNOSIS — Z79891 Long term (current) use of opiate analgesic: Secondary | ICD-10-CM | POA: Diagnosis not present

## 2023-11-03 DIAGNOSIS — M25519 Pain in unspecified shoulder: Secondary | ICD-10-CM | POA: Diagnosis not present

## 2023-11-03 DIAGNOSIS — M545 Low back pain, unspecified: Secondary | ICD-10-CM | POA: Diagnosis not present

## 2023-11-03 DIAGNOSIS — N186 End stage renal disease: Secondary | ICD-10-CM | POA: Diagnosis not present

## 2023-11-03 NOTE — Telephone Encounter (Signed)
 FYI Only or Action Required?: FYI only for provider  Patient was last seen in primary care on 10/20/2023 by Maxie Spaniel, MD. Called Nurse Triage reporting Results. Symptoms began several days ago. Interventions attempted: Other: na. Symptoms are: unchanged.  Triage Disposition: Call PCP When Office is Open  Patient/caregiver understands and will follow disposition?: yes  Reason for Disposition  Requesting regular office appointment  Answer Assessment - Initial Assessment Questions 1. REASON FOR CALL or QUESTION: What is your reason for calling today? or How can I best help you? or What question do you have that I can help answer?     Patient is upset that her lab tests are showing positive for alcohol- she states she does not drink alcohol. Patient was able to get her medication - but has been warning that if her testes are positive again- she will be excused from pain clinic. Patient would like to discuss this with her PCP.  Protocols used: Information Only Call - No Triage-A-AH    Copied from CRM [id number]. Topic: Clinical - Red Word Triage >> Nov 03, 2023  1:36 PM [name] [name] wrote: Red Word that prompted transfer to Nurse Triage:   Patient is experiencing pain ; right knee and lower back; hands; right ankle  Pain 1-10: 7  Please review mychart message

## 2023-11-04 DIAGNOSIS — N2581 Secondary hyperparathyroidism of renal origin: Secondary | ICD-10-CM | POA: Diagnosis not present

## 2023-11-04 DIAGNOSIS — D509 Iron deficiency anemia, unspecified: Secondary | ICD-10-CM | POA: Diagnosis not present

## 2023-11-04 DIAGNOSIS — Z992 Dependence on renal dialysis: Secondary | ICD-10-CM | POA: Diagnosis not present

## 2023-11-04 DIAGNOSIS — D689 Coagulation defect, unspecified: Secondary | ICD-10-CM | POA: Diagnosis not present

## 2023-11-04 DIAGNOSIS — N186 End stage renal disease: Secondary | ICD-10-CM | POA: Diagnosis not present

## 2023-11-04 DIAGNOSIS — D631 Anemia in chronic kidney disease: Secondary | ICD-10-CM | POA: Diagnosis not present

## 2023-11-06 ENCOUNTER — Other Ambulatory Visit: Payer: Self-pay | Admitting: Student

## 2023-11-07 ENCOUNTER — Ambulatory Visit: Payer: Self-pay | Admitting: Student

## 2023-11-07 DIAGNOSIS — D689 Coagulation defect, unspecified: Secondary | ICD-10-CM | POA: Diagnosis not present

## 2023-11-07 DIAGNOSIS — N2581 Secondary hyperparathyroidism of renal origin: Secondary | ICD-10-CM | POA: Diagnosis not present

## 2023-11-07 DIAGNOSIS — D631 Anemia in chronic kidney disease: Secondary | ICD-10-CM | POA: Diagnosis not present

## 2023-11-07 DIAGNOSIS — Z992 Dependence on renal dialysis: Secondary | ICD-10-CM | POA: Diagnosis not present

## 2023-11-07 DIAGNOSIS — D509 Iron deficiency anemia, unspecified: Secondary | ICD-10-CM | POA: Diagnosis not present

## 2023-11-07 DIAGNOSIS — N186 End stage renal disease: Secondary | ICD-10-CM | POA: Diagnosis not present

## 2023-11-07 NOTE — Progress Notes (Deleted)
 CC: Abnormal Lab Result   HPI:  [name] is a 48 y.o. female with a past medical history of Type 2 Diabetes Mellitus, CAD, OSA, HFpEF, ESRD on dialysis who is presenting to the clinic to discuss an abnormal lab result.   Medications: Gout: Allopurinol  1 tablet 3 times weekly  HTN: Amlodipine  10 mg daily  CAD: Aspirin  81 mg daily  HLD: Lipitor 80 mg daily  Anxiety and Depression: Wellbutrin  100 mg every 48 hours, hydroxyzine  25 mg PRN Chronic pain: Voltaren  Gel 1g 4 times daily, Percocet 7.5 mg every 8 hours as needed Allergic rhinitis: Flonase  1 spray daily  HFpEF: Lasix  20 mg on non dialysis days, metoprolol  succinate 25 mg daily Hypothyroidism: Levothyroxine  88 mcg daily Diabetes: Mounjaro  15 mg weekly ESRD: Renvela  800 mg, 6 to 7 tabs  2 times daily with meals    Past Medical History:  Diagnosis Date   Abdominal muscle pain 09/08/2016   Abnormal Pap smear of cervix 2009   Abscess    history of multiple abscesses   Acute bilateral low back pain 02/13/2017   Acute blood loss anemia    Hx  Iron  infusions   Acute on chronic renal failure (HCC) 07/12/2012   Acute renal failure (HCC) 07/12/2012   Acute respiratory failure with hypoxia (HCC) 10/21/2019   Adjustment disorder with depressed mood 03/26/2017   AKI (acute kidney injury) (HCC)    Dialysis t-th-s   Anemia 10/21/2019   Anemia of chronic disease 2002   Anginal pain (HCC)    Anxiety    Panic attacks   Bilateral lower extremity edema 05/13/2016   Bipolar disorder (HCC)    Carpal tunnel syndrome, bilateral 01/25/2012   Cataract    B/L cataract   Cellulitis 05/21/2014   right eye   CHF (congestive heart failure) (HCC)    Chronic bronchitis (HCC)    get it q yr (05/13/2013)   Chronic pain    Chronic pain of right knee 09/08/2016   Crutches as ambulation aid    uses forearm crutchs, Left  AKA with prosthetic.   Dehiscence of amputation stump (HCC)    Depression    Edema of lower extremity     Endocarditis 2002   subacute bacterial endocarditis.    Fall    Family history of anesthesia complication    my mom has a hard time coming out from under   Fibromyalgia    GERD (gastroesophageal reflux disease)    occ   Heart murmur    never has had any problems   Herpes simplex type 1 infection 01/16/2018   Hidradenitis suppurativa 02/24/2021   History of blood transfusion    just low blood count (05/13/2013)   History of sexually transmitted infection    Hyperlipidemia    Hypertension    Hypothyroidism    Hypothyroidism, adult 03/21/2014   Hypoxia    Leukocytosis    Muscle spasm 12/16/2020   Necrosis (HCC)    and ulceration   Necrotizing fasciitis s/p OR debridements 07/06/2012   Obesity    OSA on CPAP    does not use CPAP   Peripheral neuropathy    Pneumonia    Pyelonephritis 12/31/2020   Routine adult health maintenance 12/08/2016   Severe protein-calorie malnutrition (HCC)    Status post below knee amputation, left (HCC) 04/11/2017   Type II diabetes mellitus (HCC)    Type  II   Vaginal irritation 03/14/2016   Weak pulse 07/21/2021   Wound dehiscence 09/04/2019  Current Outpatient Medications:    allopurinol  (ZYLOPRIM ) 100 MG tablet, Take 1 tablet by mouth 3 times a week. Take only after dialysis sessions. Do not take on non-dialysis days., Disp: 30 tablet, Rfl: 1   amLODipine  (NORVASC ) 10 MG tablet, Take 1 tablet by mouth daily., Disp: 90 tablet, Rfl: 1   aspirin  81 MG chewable tablet, Chew 1 tablet (81 mg total) by mouth daily., Disp: 90 tablet, Rfl: 0   atorvastatin  (LIPITOR) 80 MG tablet, Take 1 tablet (80 mg total) by mouth daily., Disp: 30 tablet, Rfl: 0   Benzoyl Peroxide  2.5 % CREA, Apply 1 drop topically daily., Disp: 21 g, Rfl: 0   blood glucose meter kit and supplies KIT, ICD 10- E11.69. Based on patient and insurance preference. Use up to four times daily as directed., Disp: 1 each, Rfl: 0   buPROPion  (WELLBUTRIN ) 100 MG tablet, Take 1 tablet  (100 mg total) by mouth every other day., Disp: 30 tablet, Rfl: 1   diclofenac  Sodium (VOLTAREN ) 1 % GEL, APPLY 1 GRAM TOPICALLY TO AFFECTED AREA 4 TIMES DAILY AS NEEDED (Patient taking differently: Apply 1 g topically 4 (four) times daily as needed (for pain).), Disp: 100 g, Rfl: 2   fluticasone  (FLONASE ) 50 MCG/ACT nasal spray, Place 1 spray into both nostrils daily. (Patient taking differently: Place 1 spray into both nostrils daily as needed for allergies.), Disp: 11.1 mL, Rfl: 2   furosemide  (LASIX ) 20 MG tablet, Take 1 tablet by mouth daily. Take on non-dialysis days. Adjust per kidney docs (Patient taking differently: Take 20 mg by mouth daily. Take on non-dialysis days. Adjust per kidney docs), Disp: 90 tablet, Rfl: 1   hydrOXYzine  (ATARAX ) 25 MG tablet, Take 1 tablet (25 mg total) by mouth 3 (three) times daily as needed for anxiety., Disp: 90 tablet, Rfl: 0   Insulin  Pen Needle (TRUEPLUS PEN NEEDLES) 31G X 5 MM MISC, 1 each by Other route 3 (three) times daily., Disp: 100 each, Rfl: 5   ketoconazole  (NIZORAL ) 2 % cream, Apply 1 application topically to affected area daily., Disp: 15 g, Rfl: 2   levothyroxine  (SYNTHROID ) 88 MCG tablet, Take 1 tablet (88 mcg total) by mouth daily., Disp: , Rfl:    metoprolol  succinate (TOPROL -XL) 25 MG 24 hr tablet, Take 1 tablet (25 mg total) by mouth daily., Disp: 90 tablet, Rfl: 1   MOUNJARO  15 MG/0.5ML Pen, Inject 15 mg below the skin once a week, Disp: 2 mL, Rfl: 11   ofloxacin  (OCUFLOX ) 0.3 % ophthalmic solution, Place 1 drop into the left eye in the morning and at bedtime., Disp: , Rfl:    oxyCODONE -acetaminophen  (PERCOCET) 7.5-325 MG tablet, Take 1 tablet by mouth every 8 (eight) hours as needed for moderate pain., Disp: , Rfl:    sevelamer  carbonate (RENVELA ) 800 MG tablet, Take 6 to 7 tablets by mouth twice a day with meals, Disp: , Rfl:    tretinoin  (RETIN-A ) 0.025 % cream, Apply topically to affected area at bedtime, Disp: 45 g, Rfl: 0   Vitamin D ,  Ergocalciferol , (DRISDOL ) 1.25 MG (50000 UNIT) CAPS capsule, Take 1 capsule (50,000 Units total) by mouth every 7 (seven) days., Disp: 9 capsule, Rfl: 0  Review of Systems:  ***  Constitutional: Eye: Respiratory: Cardiovascular: GI: MSK: GU: Skin: Neuro: Endocrine:   Physical Exam:  There were no vitals filed for this visit. *** General: Patient is sitting comfortably in the room  Eyes: Pupils equal and reactive to light, EOM intact  Head: Normocephalic, atraumatic  Neck: Supple, nontender, full range of motion, No JVD Cardio: Regular rate and rhythm, no murmurs, rubs or gallops. 2+ pulses to bilateral upper and lower extremities  Chest: No chest tenderness Pulmonary: Clear to ausculation bilaterally with no rales, rhonchi, and crackles  Abdomen: Soft, nontender with normoactive bowel sounds with no rebound or guarding  Neuro: Alert and orientated x3. CN II-XII intact. Sensation intact to upper and lower extremities. 2+ patellar reflex.  Back: No midline tenderness, no step off or deformities noted. No paraspinal muscle tenderness.  Skin: No rashes noted  MSK: 5/5 strength to upper and lower extremities.    Assessment & Plan:   No problem-specific Assessment & Plan notes found for this encounter.    Patient {GC/GE:[id number]::discussed with,seen with} Dr. {PPIRJ:[id number]::AYTKZSWF,UXNATFT,DDUKGU,RKYHCWCB,JSEGBTDV,VOHYWVP}  Jonelle Neri, DO PGY-2 Internal Medicine Resident

## 2023-11-09 DIAGNOSIS — Z992 Dependence on renal dialysis: Secondary | ICD-10-CM | POA: Diagnosis not present

## 2023-11-09 DIAGNOSIS — D509 Iron deficiency anemia, unspecified: Secondary | ICD-10-CM | POA: Diagnosis not present

## 2023-11-09 DIAGNOSIS — D689 Coagulation defect, unspecified: Secondary | ICD-10-CM | POA: Diagnosis not present

## 2023-11-09 DIAGNOSIS — N186 End stage renal disease: Secondary | ICD-10-CM | POA: Diagnosis not present

## 2023-11-09 DIAGNOSIS — D631 Anemia in chronic kidney disease: Secondary | ICD-10-CM | POA: Diagnosis not present

## 2023-11-09 DIAGNOSIS — N2581 Secondary hyperparathyroidism of renal origin: Secondary | ICD-10-CM | POA: Diagnosis not present

## 2023-11-11 DIAGNOSIS — D509 Iron deficiency anemia, unspecified: Secondary | ICD-10-CM | POA: Diagnosis not present

## 2023-11-11 DIAGNOSIS — N186 End stage renal disease: Secondary | ICD-10-CM | POA: Diagnosis not present

## 2023-11-11 DIAGNOSIS — Z992 Dependence on renal dialysis: Secondary | ICD-10-CM | POA: Diagnosis not present

## 2023-11-11 DIAGNOSIS — D689 Coagulation defect, unspecified: Secondary | ICD-10-CM | POA: Diagnosis not present

## 2023-11-11 DIAGNOSIS — D631 Anemia in chronic kidney disease: Secondary | ICD-10-CM | POA: Diagnosis not present

## 2023-11-11 DIAGNOSIS — N2581 Secondary hyperparathyroidism of renal origin: Secondary | ICD-10-CM | POA: Diagnosis not present

## 2023-11-14 DIAGNOSIS — Z992 Dependence on renal dialysis: Secondary | ICD-10-CM | POA: Diagnosis not present

## 2023-11-14 DIAGNOSIS — N186 End stage renal disease: Secondary | ICD-10-CM | POA: Diagnosis not present

## 2023-11-14 DIAGNOSIS — D689 Coagulation defect, unspecified: Secondary | ICD-10-CM | POA: Diagnosis not present

## 2023-11-14 DIAGNOSIS — D509 Iron deficiency anemia, unspecified: Secondary | ICD-10-CM | POA: Diagnosis not present

## 2023-11-14 DIAGNOSIS — D631 Anemia in chronic kidney disease: Secondary | ICD-10-CM | POA: Diagnosis not present

## 2023-11-14 DIAGNOSIS — N2581 Secondary hyperparathyroidism of renal origin: Secondary | ICD-10-CM | POA: Diagnosis not present

## 2023-11-14 NOTE — Progress Notes (Deleted)
 [facility]: Clinic Note  Subjective:  History of Present Illness: [name] [name] is a 48 y.o. year old female who presents for an acute care visit to discuss her recent labs. Dr Azadegan will be her new PCP.   Per triage note: Patient is upset that her lab tests are showing positive for alcohol- she states she does not drink alcohol. Patient was able to get her medication - but has been warning that if her testes are positive again- she will be excused from pain clinic. Patient would like to discuss this with her PCP.   Last UDS in 10/2022, THC+     Please refer to Assessment and Plan below for full details in Problem-Based Charting.   Past Medical History:  Patient Active Problem List   Diagnosis Date Noted   Skin breakdown 10/20/2023   Nasal foreign body 08/02/2023   CAD (coronary artery disease) 10/31/2022   Trichomonas infection 09/06/2022   Chronic pain syndrome 01/16/2020   Hx of AKA (above knee amputation), left (HCC)    Tobacco use 03/01/2019   Phantom limb pain (HCC) 11/08/2018   Anemia of chronic disease    Heart failure with preserved ejection fraction (HCC) 02/09/2017   Healthcare maintenance 12/08/2016   ESRD (end stage renal disease) (HCC) 07/15/2016   OSA (obstructive sleep apnea) 05/13/2016   Morbid obesity due to excess calories (HCC) 11/27/2015   Subclinical hypothyroidism 04/25/2013   Diabetes mellitus type 2 with peripheral artery disease (HCC) 07/12/2012   Diabetic polyneuropathy associated with type 2 diabetes mellitus (HCC) 07/04/2011   Anxiety and depression 06/02/2011   Hyperlipidemia 08/29/2007   Type 2 DM with hypertension and ESRD on dialysis (HCC) 08/29/2007      Medications:  Current Outpatient Medications:    allopurinol  (ZYLOPRIM ) 100 MG tablet, Take 1 tablet by mouth 3 times a week. Take only after dialysis sessions. Do not take on non-dialysis days., Disp: 30 tablet, Rfl: 1   amLODipine  (NORVASC ) 10 MG tablet,  Take 1 tablet by mouth daily., Disp: 90 tablet, Rfl: 1   aspirin  81 MG chewable tablet, Chew 1 tablet (81 mg total) by mouth daily., Disp: 90 tablet, Rfl: 0   atorvastatin  (LIPITOR) 80 MG tablet, Take 1 tablet (80 mg total) by mouth daily., Disp: 30 tablet, Rfl: 0   Benzoyl Peroxide  2.5 % CREA, Apply 1 drop topically daily., Disp: 21 g, Rfl: 0   blood glucose meter kit and supplies KIT, ICD 10- E11.69. Based on patient and insurance preference. Use up to four times daily as directed., Disp: 1 each, Rfl: 0   buPROPion  (WELLBUTRIN ) 100 MG tablet, Take 1 tablet (100 mg total) by mouth every other day., Disp: 30 tablet, Rfl: 1   diclofenac  Sodium (VOLTAREN ) 1 % GEL, APPLY 1 GRAM TOPICALLY TO AFFECTED AREA 4 TIMES DAILY AS NEEDED (Patient taking differently: Apply 1 g topically 4 (four) times daily as needed (for pain).), Disp: 100 g, Rfl: 2   fluticasone  (FLONASE ) 50 MCG/ACT nasal spray, Place 1 spray into both nostrils daily. (Patient taking differently: Place 1 spray into both nostrils daily as needed for allergies.), Disp: 11.1 mL, Rfl: 2   furosemide  (LASIX ) 20 MG tablet, Take 1 tablet by mouth as directed on non dialysis days, Disp: 20 tablet, Rfl: 3   hydrOXYzine  (ATARAX ) 25 MG tablet, Take 1 tablet (25 mg total) by mouth 3 (three) times daily as needed for anxiety., Disp: 90 tablet, Rfl: 0   Insulin  Pen Needle (TRUEPLUS PEN NEEDLES)  31G X 5 MM MISC, 1 each by Other route 3 (three) times daily., Disp: 100 each, Rfl: 5   ketoconazole  (NIZORAL ) 2 % cream, Apply 1 application topically to affected area daily., Disp: 15 g, Rfl: 2   levothyroxine  (SYNTHROID ) 88 MCG tablet, Take 1 tablet (88 mcg total) by mouth daily., Disp: , Rfl:    metoprolol  succinate (TOPROL -XL) 25 MG 24 hr tablet, Take 1 tablet (25 mg total) by mouth daily., Disp: 90 tablet, Rfl: 1   MOUNJARO  15 MG/0.5ML Pen, Inject 15 mg below the skin once a week, Disp: 2 mL, Rfl: 11   ofloxacin  (OCUFLOX ) 0.3 % ophthalmic solution, Place 1 drop  into the left eye in the morning and at bedtime., Disp: , Rfl:    oxyCODONE -acetaminophen  (PERCOCET) 7.5-325 MG tablet, Take 1 tablet by mouth every 8 (eight) hours as needed for moderate pain., Disp: , Rfl:    sevelamer  carbonate (RENVELA ) 800 MG tablet, Take 6 to 7 tablets by mouth twice a day with meals, Disp: , Rfl:    tretinoin  (RETIN-A ) 0.025 % cream, Apply topically to affected area at bedtime, Disp: 45 g, Rfl: 0   Vitamin D , Ergocalciferol , (DRISDOL ) 1.25 MG (50000 UNIT) CAPS capsule, Take 1 capsule (50,000 Units total) by mouth every 7 (seven) days., Disp: 9 capsule, Rfl: 0   Allergies: Allergies  Allergen Reactions   Vancomycin  Other (See Comments)    Acute renal failure suspected secondary to vanco       Objective:   Vitals: There were no vitals filed for this visit.   Physical Exam: Physical Exam   Data: Labs, imaging, and micro were reviewed in Epic. Refer to Assessment and Plan below for full details in Problem-Based Charting.  Assessment & Plan:  No problem-specific Assessment & Plan notes found for this encounter.     Patient will follow up in ***  Mliss Foot, MD

## 2023-11-15 ENCOUNTER — Ambulatory Visit: Payer: Self-pay | Admitting: Internal Medicine

## 2023-11-16 DIAGNOSIS — N186 End stage renal disease: Secondary | ICD-10-CM | POA: Diagnosis not present

## 2023-11-16 DIAGNOSIS — D509 Iron deficiency anemia, unspecified: Secondary | ICD-10-CM | POA: Diagnosis not present

## 2023-11-16 DIAGNOSIS — D689 Coagulation defect, unspecified: Secondary | ICD-10-CM | POA: Diagnosis not present

## 2023-11-16 DIAGNOSIS — Z992 Dependence on renal dialysis: Secondary | ICD-10-CM | POA: Diagnosis not present

## 2023-11-16 DIAGNOSIS — D631 Anemia in chronic kidney disease: Secondary | ICD-10-CM | POA: Diagnosis not present

## 2023-11-16 DIAGNOSIS — N2581 Secondary hyperparathyroidism of renal origin: Secondary | ICD-10-CM | POA: Diagnosis not present

## 2023-11-18 DIAGNOSIS — N2581 Secondary hyperparathyroidism of renal origin: Secondary | ICD-10-CM | POA: Diagnosis not present

## 2023-11-18 DIAGNOSIS — D689 Coagulation defect, unspecified: Secondary | ICD-10-CM | POA: Diagnosis not present

## 2023-11-18 DIAGNOSIS — D509 Iron deficiency anemia, unspecified: Secondary | ICD-10-CM | POA: Diagnosis not present

## 2023-11-18 DIAGNOSIS — Z992 Dependence on renal dialysis: Secondary | ICD-10-CM | POA: Diagnosis not present

## 2023-11-18 DIAGNOSIS — N186 End stage renal disease: Secondary | ICD-10-CM | POA: Diagnosis not present

## 2023-11-18 DIAGNOSIS — D631 Anemia in chronic kidney disease: Secondary | ICD-10-CM | POA: Diagnosis not present

## 2023-11-20 DIAGNOSIS — E1122 Type 2 diabetes mellitus with diabetic chronic kidney disease: Secondary | ICD-10-CM | POA: Diagnosis not present

## 2023-11-20 DIAGNOSIS — N186 End stage renal disease: Secondary | ICD-10-CM | POA: Diagnosis not present

## 2023-11-20 DIAGNOSIS — Z992 Dependence on renal dialysis: Secondary | ICD-10-CM | POA: Diagnosis not present

## 2023-11-21 DIAGNOSIS — E1122 Type 2 diabetes mellitus with diabetic chronic kidney disease: Secondary | ICD-10-CM | POA: Diagnosis not present

## 2023-11-21 DIAGNOSIS — D631 Anemia in chronic kidney disease: Secondary | ICD-10-CM | POA: Diagnosis not present

## 2023-11-21 DIAGNOSIS — N2581 Secondary hyperparathyroidism of renal origin: Secondary | ICD-10-CM | POA: Diagnosis not present

## 2023-11-21 DIAGNOSIS — T7840XA Allergy, unspecified, initial encounter: Secondary | ICD-10-CM | POA: Diagnosis not present

## 2023-11-21 DIAGNOSIS — R52 Pain, unspecified: Secondary | ICD-10-CM | POA: Diagnosis not present

## 2023-11-21 DIAGNOSIS — D689 Coagulation defect, unspecified: Secondary | ICD-10-CM | POA: Diagnosis not present

## 2023-11-21 DIAGNOSIS — D509 Iron deficiency anemia, unspecified: Secondary | ICD-10-CM | POA: Diagnosis not present

## 2023-11-21 DIAGNOSIS — Z992 Dependence on renal dialysis: Secondary | ICD-10-CM | POA: Diagnosis not present

## 2023-11-21 DIAGNOSIS — N186 End stage renal disease: Secondary | ICD-10-CM | POA: Diagnosis not present

## 2023-11-23 DIAGNOSIS — T7840XA Allergy, unspecified, initial encounter: Secondary | ICD-10-CM | POA: Diagnosis not present

## 2023-11-23 DIAGNOSIS — E1122 Type 2 diabetes mellitus with diabetic chronic kidney disease: Secondary | ICD-10-CM | POA: Diagnosis not present

## 2023-11-23 DIAGNOSIS — D631 Anemia in chronic kidney disease: Secondary | ICD-10-CM | POA: Diagnosis not present

## 2023-11-23 DIAGNOSIS — N186 End stage renal disease: Secondary | ICD-10-CM | POA: Diagnosis not present

## 2023-11-23 DIAGNOSIS — N2581 Secondary hyperparathyroidism of renal origin: Secondary | ICD-10-CM | POA: Diagnosis not present

## 2023-11-23 DIAGNOSIS — D689 Coagulation defect, unspecified: Secondary | ICD-10-CM | POA: Diagnosis not present

## 2023-11-23 DIAGNOSIS — D509 Iron deficiency anemia, unspecified: Secondary | ICD-10-CM | POA: Diagnosis not present

## 2023-11-23 DIAGNOSIS — Z992 Dependence on renal dialysis: Secondary | ICD-10-CM | POA: Diagnosis not present

## 2023-11-23 DIAGNOSIS — R52 Pain, unspecified: Secondary | ICD-10-CM | POA: Diagnosis not present

## 2023-11-25 DIAGNOSIS — N2581 Secondary hyperparathyroidism of renal origin: Secondary | ICD-10-CM | POA: Diagnosis not present

## 2023-11-25 DIAGNOSIS — N186 End stage renal disease: Secondary | ICD-10-CM | POA: Diagnosis not present

## 2023-11-25 DIAGNOSIS — D631 Anemia in chronic kidney disease: Secondary | ICD-10-CM | POA: Diagnosis not present

## 2023-11-25 DIAGNOSIS — Z992 Dependence on renal dialysis: Secondary | ICD-10-CM | POA: Diagnosis not present

## 2023-11-25 DIAGNOSIS — D509 Iron deficiency anemia, unspecified: Secondary | ICD-10-CM | POA: Diagnosis not present

## 2023-11-25 DIAGNOSIS — D689 Coagulation defect, unspecified: Secondary | ICD-10-CM | POA: Diagnosis not present

## 2023-11-25 DIAGNOSIS — R52 Pain, unspecified: Secondary | ICD-10-CM | POA: Diagnosis not present

## 2023-11-25 DIAGNOSIS — T7840XA Allergy, unspecified, initial encounter: Secondary | ICD-10-CM | POA: Diagnosis not present

## 2023-11-25 DIAGNOSIS — E1122 Type 2 diabetes mellitus with diabetic chronic kidney disease: Secondary | ICD-10-CM | POA: Diagnosis not present

## 2023-11-28 DIAGNOSIS — T7840XA Allergy, unspecified, initial encounter: Secondary | ICD-10-CM | POA: Diagnosis not present

## 2023-11-28 DIAGNOSIS — N186 End stage renal disease: Secondary | ICD-10-CM | POA: Diagnosis not present

## 2023-11-28 DIAGNOSIS — R52 Pain, unspecified: Secondary | ICD-10-CM | POA: Diagnosis not present

## 2023-11-28 DIAGNOSIS — E1122 Type 2 diabetes mellitus with diabetic chronic kidney disease: Secondary | ICD-10-CM | POA: Diagnosis not present

## 2023-11-28 DIAGNOSIS — D509 Iron deficiency anemia, unspecified: Secondary | ICD-10-CM | POA: Diagnosis not present

## 2023-11-28 DIAGNOSIS — D689 Coagulation defect, unspecified: Secondary | ICD-10-CM | POA: Diagnosis not present

## 2023-11-28 DIAGNOSIS — D631 Anemia in chronic kidney disease: Secondary | ICD-10-CM | POA: Diagnosis not present

## 2023-11-28 DIAGNOSIS — Z992 Dependence on renal dialysis: Secondary | ICD-10-CM | POA: Diagnosis not present

## 2023-11-28 DIAGNOSIS — N2581 Secondary hyperparathyroidism of renal origin: Secondary | ICD-10-CM | POA: Diagnosis not present

## 2023-11-28 NOTE — Telephone Encounter (Unsigned)
 Copied from CRM [id number]. Topic: Clinical - Medication Refill >> Nov 28, 2023 10:30 AM Suzette B wrote: Medication: mupirocin  ointment (BACTROBAN ) 2 % (History)  Has the patient contacted their pharmacy? Yes Patient stated that when she called the pharmacy they informed her that the refill request had been denied! I advised her of the previous provider whom may had denied it but to allow for someone to speak with her from this providers office.   This is the patient's preferred pharmacy:  [facility] - [place], [place] - [address] [place] [address] Phone: [phone] Fax: [phone]    Is this the correct pharmacy for this prescription? Yes If no, delete pharmacy and type the correct one.   Has the prescription been filled recently? No 10/2022  Is the patient out of the medication? Yes  Has the patient been seen for an appointment in the last year OR does the patient have an upcoming appointment? Yes  Can we respond through MyChart? Yes  Agent: Please be advised that Rx refills may take up to 3 business days. We ask that you follow-up with your pharmacy.

## 2023-11-30 DIAGNOSIS — D689 Coagulation defect, unspecified: Secondary | ICD-10-CM | POA: Diagnosis not present

## 2023-11-30 DIAGNOSIS — D509 Iron deficiency anemia, unspecified: Secondary | ICD-10-CM | POA: Diagnosis not present

## 2023-11-30 DIAGNOSIS — N2581 Secondary hyperparathyroidism of renal origin: Secondary | ICD-10-CM | POA: Diagnosis not present

## 2023-11-30 DIAGNOSIS — E1122 Type 2 diabetes mellitus with diabetic chronic kidney disease: Secondary | ICD-10-CM | POA: Diagnosis not present

## 2023-11-30 DIAGNOSIS — T7840XA Allergy, unspecified, initial encounter: Secondary | ICD-10-CM | POA: Diagnosis not present

## 2023-11-30 DIAGNOSIS — R52 Pain, unspecified: Secondary | ICD-10-CM | POA: Diagnosis not present

## 2023-11-30 DIAGNOSIS — D631 Anemia in chronic kidney disease: Secondary | ICD-10-CM | POA: Diagnosis not present

## 2023-11-30 DIAGNOSIS — Z992 Dependence on renal dialysis: Secondary | ICD-10-CM | POA: Diagnosis not present

## 2023-11-30 DIAGNOSIS — N186 End stage renal disease: Secondary | ICD-10-CM | POA: Diagnosis not present

## 2023-12-01 DIAGNOSIS — M545 Low back pain, unspecified: Secondary | ICD-10-CM | POA: Diagnosis not present

## 2023-12-01 DIAGNOSIS — M25519 Pain in unspecified shoulder: Secondary | ICD-10-CM | POA: Diagnosis not present

## 2023-12-01 DIAGNOSIS — G894 Chronic pain syndrome: Secondary | ICD-10-CM | POA: Diagnosis not present

## 2023-12-01 DIAGNOSIS — Z79891 Long term (current) use of opiate analgesic: Secondary | ICD-10-CM | POA: Diagnosis not present

## 2023-12-01 DIAGNOSIS — M79643 Pain in unspecified hand: Secondary | ICD-10-CM | POA: Diagnosis not present

## 2023-12-02 DIAGNOSIS — N186 End stage renal disease: Secondary | ICD-10-CM | POA: Diagnosis not present

## 2023-12-02 DIAGNOSIS — Z992 Dependence on renal dialysis: Secondary | ICD-10-CM | POA: Diagnosis not present

## 2023-12-02 DIAGNOSIS — R52 Pain, unspecified: Secondary | ICD-10-CM | POA: Diagnosis not present

## 2023-12-02 DIAGNOSIS — T7840XA Allergy, unspecified, initial encounter: Secondary | ICD-10-CM | POA: Diagnosis not present

## 2023-12-02 DIAGNOSIS — D509 Iron deficiency anemia, unspecified: Secondary | ICD-10-CM | POA: Diagnosis not present

## 2023-12-02 DIAGNOSIS — E1122 Type 2 diabetes mellitus with diabetic chronic kidney disease: Secondary | ICD-10-CM | POA: Diagnosis not present

## 2023-12-02 DIAGNOSIS — D631 Anemia in chronic kidney disease: Secondary | ICD-10-CM | POA: Diagnosis not present

## 2023-12-02 DIAGNOSIS — N2581 Secondary hyperparathyroidism of renal origin: Secondary | ICD-10-CM | POA: Diagnosis not present

## 2023-12-02 DIAGNOSIS — D689 Coagulation defect, unspecified: Secondary | ICD-10-CM | POA: Diagnosis not present

## 2023-12-05 DIAGNOSIS — D509 Iron deficiency anemia, unspecified: Secondary | ICD-10-CM | POA: Diagnosis not present

## 2023-12-05 DIAGNOSIS — Z992 Dependence on renal dialysis: Secondary | ICD-10-CM | POA: Diagnosis not present

## 2023-12-05 DIAGNOSIS — N2581 Secondary hyperparathyroidism of renal origin: Secondary | ICD-10-CM | POA: Diagnosis not present

## 2023-12-05 DIAGNOSIS — D631 Anemia in chronic kidney disease: Secondary | ICD-10-CM | POA: Diagnosis not present

## 2023-12-05 DIAGNOSIS — E1122 Type 2 diabetes mellitus with diabetic chronic kidney disease: Secondary | ICD-10-CM | POA: Diagnosis not present

## 2023-12-05 DIAGNOSIS — D689 Coagulation defect, unspecified: Secondary | ICD-10-CM | POA: Diagnosis not present

## 2023-12-05 DIAGNOSIS — N186 End stage renal disease: Secondary | ICD-10-CM | POA: Diagnosis not present

## 2023-12-05 DIAGNOSIS — T7840XA Allergy, unspecified, initial encounter: Secondary | ICD-10-CM | POA: Diagnosis not present

## 2023-12-05 DIAGNOSIS — R52 Pain, unspecified: Secondary | ICD-10-CM | POA: Diagnosis not present

## 2023-12-06 ENCOUNTER — Other Ambulatory Visit: Payer: Self-pay

## 2023-12-06 MED ORDER — OXYCODONE-ACETAMINOPHEN 7.5-325 MG PO TABS: 1.0000 | ORAL_TABLET | Freq: Three times a day (TID) | ORAL | 0 refills | Status: AC | PRN

## 2023-12-06 NOTE — Telephone Encounter (Unsigned)
 Copied from CRM [id number]. Topic: Clinical - Medication Refill >> Dec 06, 2023  9:45 AM Diannia H wrote: Medication: oxyCODONE -acetaminophen  (PERCOCET) 7.5-325 MG tablet  Has the patient contacted their pharmacy? Yes (Agent: If no, request that the patient contact the pharmacy for the refill. If patient does not wish to contact the pharmacy document the reason why and proceed with request.) (Agent: If yes, when and what did the pharmacy advise?)  This is the patient's preferred pharmacy:  [facility] - [place], [place] - [address] [place] [address] Phone: [phone] Fax: [phone]  Is this the correct pharmacy for this prescription? Yes If no, delete pharmacy and type the correct one.   Has the prescription been filled recently? No  Is the patient out of the medication? Yes  Has the patient been seen for an appointment in the last year OR does the patient have an upcoming appointment? Yes  Can we respond through MyChart? Yes  Agent: Please be advised that Rx refills may take up to 3 business days. We ask that you follow-up with your pharmacy.

## 2023-12-07 DIAGNOSIS — T7840XA Allergy, unspecified, initial encounter: Secondary | ICD-10-CM | POA: Diagnosis not present

## 2023-12-07 DIAGNOSIS — Z992 Dependence on renal dialysis: Secondary | ICD-10-CM | POA: Diagnosis not present

## 2023-12-07 DIAGNOSIS — D509 Iron deficiency anemia, unspecified: Secondary | ICD-10-CM | POA: Diagnosis not present

## 2023-12-07 DIAGNOSIS — N2581 Secondary hyperparathyroidism of renal origin: Secondary | ICD-10-CM | POA: Diagnosis not present

## 2023-12-07 DIAGNOSIS — N186 End stage renal disease: Secondary | ICD-10-CM | POA: Diagnosis not present

## 2023-12-07 DIAGNOSIS — D631 Anemia in chronic kidney disease: Secondary | ICD-10-CM | POA: Diagnosis not present

## 2023-12-07 DIAGNOSIS — R52 Pain, unspecified: Secondary | ICD-10-CM | POA: Diagnosis not present

## 2023-12-07 DIAGNOSIS — D689 Coagulation defect, unspecified: Secondary | ICD-10-CM | POA: Diagnosis not present

## 2023-12-07 DIAGNOSIS — E1122 Type 2 diabetes mellitus with diabetic chronic kidney disease: Secondary | ICD-10-CM | POA: Diagnosis not present

## 2023-12-08 DIAGNOSIS — M129 Arthropathy, unspecified: Secondary | ICD-10-CM | POA: Diagnosis not present

## 2023-12-08 DIAGNOSIS — Z79899 Other long term (current) drug therapy: Secondary | ICD-10-CM | POA: Diagnosis not present

## 2023-12-09 DIAGNOSIS — D689 Coagulation defect, unspecified: Secondary | ICD-10-CM | POA: Diagnosis not present

## 2023-12-09 DIAGNOSIS — E1122 Type 2 diabetes mellitus with diabetic chronic kidney disease: Secondary | ICD-10-CM | POA: Diagnosis not present

## 2023-12-09 DIAGNOSIS — R52 Pain, unspecified: Secondary | ICD-10-CM | POA: Diagnosis not present

## 2023-12-09 DIAGNOSIS — N2581 Secondary hyperparathyroidism of renal origin: Secondary | ICD-10-CM | POA: Diagnosis not present

## 2023-12-09 DIAGNOSIS — D509 Iron deficiency anemia, unspecified: Secondary | ICD-10-CM | POA: Diagnosis not present

## 2023-12-09 DIAGNOSIS — N186 End stage renal disease: Secondary | ICD-10-CM | POA: Diagnosis not present

## 2023-12-09 DIAGNOSIS — Z992 Dependence on renal dialysis: Secondary | ICD-10-CM | POA: Diagnosis not present

## 2023-12-09 DIAGNOSIS — T7840XA Allergy, unspecified, initial encounter: Secondary | ICD-10-CM | POA: Diagnosis not present

## 2023-12-09 DIAGNOSIS — D631 Anemia in chronic kidney disease: Secondary | ICD-10-CM | POA: Diagnosis not present

## 2023-12-12 DIAGNOSIS — Z992 Dependence on renal dialysis: Secondary | ICD-10-CM | POA: Diagnosis not present

## 2023-12-12 DIAGNOSIS — N2581 Secondary hyperparathyroidism of renal origin: Secondary | ICD-10-CM | POA: Diagnosis not present

## 2023-12-12 DIAGNOSIS — N186 End stage renal disease: Secondary | ICD-10-CM | POA: Diagnosis not present

## 2023-12-12 DIAGNOSIS — T7840XA Allergy, unspecified, initial encounter: Secondary | ICD-10-CM | POA: Diagnosis not present

## 2023-12-12 DIAGNOSIS — D631 Anemia in chronic kidney disease: Secondary | ICD-10-CM | POA: Diagnosis not present

## 2023-12-12 DIAGNOSIS — D509 Iron deficiency anemia, unspecified: Secondary | ICD-10-CM | POA: Diagnosis not present

## 2023-12-12 DIAGNOSIS — D689 Coagulation defect, unspecified: Secondary | ICD-10-CM | POA: Diagnosis not present

## 2023-12-12 DIAGNOSIS — R52 Pain, unspecified: Secondary | ICD-10-CM | POA: Diagnosis not present

## 2023-12-12 DIAGNOSIS — E1122 Type 2 diabetes mellitus with diabetic chronic kidney disease: Secondary | ICD-10-CM | POA: Diagnosis not present

## 2023-12-13 ENCOUNTER — Other Ambulatory Visit: Payer: Self-pay

## 2023-12-13 DIAGNOSIS — F32A Depression, unspecified: Secondary | ICD-10-CM

## 2023-12-13 DIAGNOSIS — E1159 Type 2 diabetes mellitus with other circulatory complications: Secondary | ICD-10-CM

## 2023-12-13 NOTE — Telephone Encounter (Signed)
 Pharmacy is requesting additional refills to place on file.

## 2023-12-14 DIAGNOSIS — D509 Iron deficiency anemia, unspecified: Secondary | ICD-10-CM | POA: Diagnosis not present

## 2023-12-14 DIAGNOSIS — N2581 Secondary hyperparathyroidism of renal origin: Secondary | ICD-10-CM | POA: Diagnosis not present

## 2023-12-14 DIAGNOSIS — D689 Coagulation defect, unspecified: Secondary | ICD-10-CM | POA: Diagnosis not present

## 2023-12-14 DIAGNOSIS — R52 Pain, unspecified: Secondary | ICD-10-CM | POA: Diagnosis not present

## 2023-12-14 DIAGNOSIS — Z992 Dependence on renal dialysis: Secondary | ICD-10-CM | POA: Diagnosis not present

## 2023-12-14 DIAGNOSIS — D631 Anemia in chronic kidney disease: Secondary | ICD-10-CM | POA: Diagnosis not present

## 2023-12-14 DIAGNOSIS — N186 End stage renal disease: Secondary | ICD-10-CM | POA: Diagnosis not present

## 2023-12-14 DIAGNOSIS — T7840XA Allergy, unspecified, initial encounter: Secondary | ICD-10-CM | POA: Diagnosis not present

## 2023-12-14 DIAGNOSIS — E1122 Type 2 diabetes mellitus with diabetic chronic kidney disease: Secondary | ICD-10-CM | POA: Diagnosis not present

## 2023-12-15 MED ORDER — AMLODIPINE BESYLATE 10 MG PO TABS: 10.0000 mg | ORAL_TABLET | Freq: Every day | ORAL | 1 refills | Status: DC

## 2023-12-15 MED ORDER — BUPROPION HCL 100 MG PO TABS: 100.0000 mg | ORAL_TABLET | ORAL | 4 refills | Status: AC

## 2023-12-16 DIAGNOSIS — T7840XA Allergy, unspecified, initial encounter: Secondary | ICD-10-CM | POA: Diagnosis not present

## 2023-12-16 DIAGNOSIS — N2581 Secondary hyperparathyroidism of renal origin: Secondary | ICD-10-CM | POA: Diagnosis not present

## 2023-12-16 DIAGNOSIS — E1122 Type 2 diabetes mellitus with diabetic chronic kidney disease: Secondary | ICD-10-CM | POA: Diagnosis not present

## 2023-12-16 DIAGNOSIS — Z992 Dependence on renal dialysis: Secondary | ICD-10-CM | POA: Diagnosis not present

## 2023-12-16 DIAGNOSIS — D509 Iron deficiency anemia, unspecified: Secondary | ICD-10-CM | POA: Diagnosis not present

## 2023-12-16 DIAGNOSIS — D689 Coagulation defect, unspecified: Secondary | ICD-10-CM | POA: Diagnosis not present

## 2023-12-16 DIAGNOSIS — N186 End stage renal disease: Secondary | ICD-10-CM | POA: Diagnosis not present

## 2023-12-16 DIAGNOSIS — R52 Pain, unspecified: Secondary | ICD-10-CM | POA: Diagnosis not present

## 2023-12-16 DIAGNOSIS — D631 Anemia in chronic kidney disease: Secondary | ICD-10-CM | POA: Diagnosis not present

## 2023-12-19 DIAGNOSIS — N2581 Secondary hyperparathyroidism of renal origin: Secondary | ICD-10-CM | POA: Diagnosis not present

## 2023-12-19 DIAGNOSIS — Z992 Dependence on renal dialysis: Secondary | ICD-10-CM | POA: Diagnosis not present

## 2023-12-19 DIAGNOSIS — T7840XA Allergy, unspecified, initial encounter: Secondary | ICD-10-CM | POA: Diagnosis not present

## 2023-12-19 DIAGNOSIS — R52 Pain, unspecified: Secondary | ICD-10-CM | POA: Diagnosis not present

## 2023-12-19 DIAGNOSIS — D689 Coagulation defect, unspecified: Secondary | ICD-10-CM | POA: Diagnosis not present

## 2023-12-19 DIAGNOSIS — E1122 Type 2 diabetes mellitus with diabetic chronic kidney disease: Secondary | ICD-10-CM | POA: Diagnosis not present

## 2023-12-19 DIAGNOSIS — D509 Iron deficiency anemia, unspecified: Secondary | ICD-10-CM | POA: Diagnosis not present

## 2023-12-19 DIAGNOSIS — D631 Anemia in chronic kidney disease: Secondary | ICD-10-CM | POA: Diagnosis not present

## 2023-12-19 DIAGNOSIS — N186 End stage renal disease: Secondary | ICD-10-CM | POA: Diagnosis not present

## 2023-12-20 ENCOUNTER — Encounter

## 2023-12-21 DIAGNOSIS — R52 Pain, unspecified: Secondary | ICD-10-CM | POA: Diagnosis not present

## 2023-12-21 DIAGNOSIS — Z992 Dependence on renal dialysis: Secondary | ICD-10-CM | POA: Diagnosis not present

## 2023-12-21 DIAGNOSIS — D689 Coagulation defect, unspecified: Secondary | ICD-10-CM | POA: Diagnosis not present

## 2023-12-21 DIAGNOSIS — E1122 Type 2 diabetes mellitus with diabetic chronic kidney disease: Secondary | ICD-10-CM | POA: Diagnosis not present

## 2023-12-21 DIAGNOSIS — D631 Anemia in chronic kidney disease: Secondary | ICD-10-CM | POA: Diagnosis not present

## 2023-12-21 DIAGNOSIS — N2581 Secondary hyperparathyroidism of renal origin: Secondary | ICD-10-CM | POA: Diagnosis not present

## 2023-12-21 DIAGNOSIS — D509 Iron deficiency anemia, unspecified: Secondary | ICD-10-CM | POA: Diagnosis not present

## 2023-12-21 DIAGNOSIS — T7840XA Allergy, unspecified, initial encounter: Secondary | ICD-10-CM | POA: Diagnosis not present

## 2023-12-21 DIAGNOSIS — N186 End stage renal disease: Secondary | ICD-10-CM | POA: Diagnosis not present

## 2023-12-23 DIAGNOSIS — D509 Iron deficiency anemia, unspecified: Secondary | ICD-10-CM | POA: Diagnosis not present

## 2023-12-23 DIAGNOSIS — R52 Pain, unspecified: Secondary | ICD-10-CM | POA: Diagnosis not present

## 2023-12-23 DIAGNOSIS — Z992 Dependence on renal dialysis: Secondary | ICD-10-CM | POA: Diagnosis not present

## 2023-12-23 DIAGNOSIS — N186 End stage renal disease: Secondary | ICD-10-CM | POA: Diagnosis not present

## 2023-12-23 DIAGNOSIS — D631 Anemia in chronic kidney disease: Secondary | ICD-10-CM | POA: Diagnosis not present

## 2023-12-23 DIAGNOSIS — D689 Coagulation defect, unspecified: Secondary | ICD-10-CM | POA: Diagnosis not present

## 2023-12-23 DIAGNOSIS — N2581 Secondary hyperparathyroidism of renal origin: Secondary | ICD-10-CM | POA: Diagnosis not present

## 2023-12-27 ENCOUNTER — Other Ambulatory Visit: Payer: Self-pay

## 2023-12-27 DIAGNOSIS — M109 Gout, unspecified: Secondary | ICD-10-CM

## 2023-12-27 DIAGNOSIS — I152 Hypertension secondary to endocrine disorders: Secondary | ICD-10-CM

## 2023-12-27 NOTE — Telephone Encounter (Unsigned)
 Copied from CRM [id number]. Topic: Clinical - Medication Refill >> Dec 27, 2023 11:53 AM Adrianna P wrote: Medication: allopurinol  (ZYLOPRIM ) 100 MG tablet [[id number]]  Has the patient contacted their pharmacy? Yes (Agent: If no, request that the patient contact the pharmacy for the refill. If patient does not wish to contact the pharmacy document the reason why and proceed with request.) (Agent: If yes, when and what did the pharmacy advise?)  This is the patient's preferred pharmacy:  [facility] - [place], [place] - [address] [place] [address] Phone: [phone] Fax: [phone]  [facility] [address] [place] [address] Phone: [phone] Fax: [phone]  Is this the correct pharmacy for this prescription? Yes If no, delete pharmacy and type the correct one.   Has the prescription been filled recently? No  Is the patient out of the medication? Yes  Has the patient been seen for an appointment in the last year OR does the patient have an upcoming appointment? Yes  Can we respond through MyChart? No  Agent: Please be advised that Rx refills may take up to 3 business days. We ask that you follow-up with your pharmacy.

## 2023-12-28 MED ORDER — METOPROLOL SUCCINATE ER 25 MG PO TB24: 25.0000 mg | ORAL_TABLET | Freq: Every day | ORAL | 1 refills | Status: DC

## 2023-12-28 MED ORDER — ALLOPURINOL 100 MG PO TABS
ORAL_TABLET | ORAL | 1 refills | Status: DC
Start: 2023-12-28 — End: 2024-03-04

## 2023-12-29 DIAGNOSIS — G546 Phantom limb syndrome with pain: Secondary | ICD-10-CM | POA: Diagnosis not present

## 2023-12-29 DIAGNOSIS — M25562 Pain in left knee: Secondary | ICD-10-CM | POA: Diagnosis not present

## 2023-12-29 DIAGNOSIS — M79643 Pain in unspecified hand: Secondary | ICD-10-CM | POA: Diagnosis not present

## 2023-12-29 DIAGNOSIS — Z992 Dependence on renal dialysis: Secondary | ICD-10-CM | POA: Diagnosis not present

## 2023-12-30 DIAGNOSIS — D631 Anemia in chronic kidney disease: Secondary | ICD-10-CM | POA: Diagnosis not present

## 2023-12-30 DIAGNOSIS — N186 End stage renal disease: Secondary | ICD-10-CM | POA: Diagnosis not present

## 2023-12-30 DIAGNOSIS — D509 Iron deficiency anemia, unspecified: Secondary | ICD-10-CM | POA: Diagnosis not present

## 2023-12-30 DIAGNOSIS — N2581 Secondary hyperparathyroidism of renal origin: Secondary | ICD-10-CM | POA: Diagnosis not present

## 2023-12-30 DIAGNOSIS — D689 Coagulation defect, unspecified: Secondary | ICD-10-CM | POA: Diagnosis not present

## 2023-12-30 DIAGNOSIS — Z992 Dependence on renal dialysis: Secondary | ICD-10-CM | POA: Diagnosis not present

## 2023-12-30 DIAGNOSIS — R52 Pain, unspecified: Secondary | ICD-10-CM | POA: Diagnosis not present

## 2024-01-04 DIAGNOSIS — D689 Coagulation defect, unspecified: Secondary | ICD-10-CM | POA: Diagnosis not present

## 2024-01-04 DIAGNOSIS — R52 Pain, unspecified: Secondary | ICD-10-CM | POA: Diagnosis not present

## 2024-01-04 DIAGNOSIS — D631 Anemia in chronic kidney disease: Secondary | ICD-10-CM | POA: Diagnosis not present

## 2024-01-04 DIAGNOSIS — Z992 Dependence on renal dialysis: Secondary | ICD-10-CM | POA: Diagnosis not present

## 2024-01-04 DIAGNOSIS — D509 Iron deficiency anemia, unspecified: Secondary | ICD-10-CM | POA: Diagnosis not present

## 2024-01-04 DIAGNOSIS — N2581 Secondary hyperparathyroidism of renal origin: Secondary | ICD-10-CM | POA: Diagnosis not present

## 2024-01-04 DIAGNOSIS — N186 End stage renal disease: Secondary | ICD-10-CM | POA: Diagnosis not present

## 2024-01-05 ENCOUNTER — Encounter: Admitting: Pediatrics

## 2024-01-06 DIAGNOSIS — D509 Iron deficiency anemia, unspecified: Secondary | ICD-10-CM | POA: Diagnosis not present

## 2024-01-06 DIAGNOSIS — R52 Pain, unspecified: Secondary | ICD-10-CM | POA: Diagnosis not present

## 2024-01-06 DIAGNOSIS — Z992 Dependence on renal dialysis: Secondary | ICD-10-CM | POA: Diagnosis not present

## 2024-01-06 DIAGNOSIS — N2581 Secondary hyperparathyroidism of renal origin: Secondary | ICD-10-CM | POA: Diagnosis not present

## 2024-01-06 DIAGNOSIS — D689 Coagulation defect, unspecified: Secondary | ICD-10-CM | POA: Diagnosis not present

## 2024-01-06 DIAGNOSIS — N186 End stage renal disease: Secondary | ICD-10-CM | POA: Diagnosis not present

## 2024-01-06 DIAGNOSIS — D631 Anemia in chronic kidney disease: Secondary | ICD-10-CM | POA: Diagnosis not present

## 2024-01-09 DIAGNOSIS — D509 Iron deficiency anemia, unspecified: Secondary | ICD-10-CM | POA: Diagnosis not present

## 2024-01-11 DIAGNOSIS — D689 Coagulation defect, unspecified: Secondary | ICD-10-CM | POA: Diagnosis not present

## 2024-01-11 DIAGNOSIS — D509 Iron deficiency anemia, unspecified: Secondary | ICD-10-CM | POA: Diagnosis not present

## 2024-01-11 DIAGNOSIS — N2581 Secondary hyperparathyroidism of renal origin: Secondary | ICD-10-CM | POA: Diagnosis not present

## 2024-01-11 DIAGNOSIS — R52 Pain, unspecified: Secondary | ICD-10-CM | POA: Diagnosis not present

## 2024-01-11 DIAGNOSIS — Z992 Dependence on renal dialysis: Secondary | ICD-10-CM | POA: Diagnosis not present

## 2024-01-11 DIAGNOSIS — D631 Anemia in chronic kidney disease: Secondary | ICD-10-CM | POA: Diagnosis not present

## 2024-01-16 DIAGNOSIS — D689 Coagulation defect, unspecified: Secondary | ICD-10-CM | POA: Diagnosis not present

## 2024-01-18 DIAGNOSIS — D689 Coagulation defect, unspecified: Secondary | ICD-10-CM | POA: Diagnosis not present

## 2024-01-19 DIAGNOSIS — N2581 Secondary hyperparathyroidism of renal origin: Secondary | ICD-10-CM | POA: Diagnosis not present

## 2024-01-20 DIAGNOSIS — N186 End stage renal disease: Secondary | ICD-10-CM | POA: Diagnosis not present

## 2024-01-20 DIAGNOSIS — N2581 Secondary hyperparathyroidism of renal origin: Secondary | ICD-10-CM | POA: Diagnosis not present

## 2024-01-20 DIAGNOSIS — D689 Coagulation defect, unspecified: Secondary | ICD-10-CM | POA: Diagnosis not present

## 2024-01-20 DIAGNOSIS — Z992 Dependence on renal dialysis: Secondary | ICD-10-CM | POA: Diagnosis not present

## 2024-01-20 DIAGNOSIS — D509 Iron deficiency anemia, unspecified: Secondary | ICD-10-CM | POA: Diagnosis not present

## 2024-01-20 DIAGNOSIS — D631 Anemia in chronic kidney disease: Secondary | ICD-10-CM | POA: Diagnosis not present

## 2024-01-20 DIAGNOSIS — R52 Pain, unspecified: Secondary | ICD-10-CM | POA: Diagnosis not present

## 2024-01-21 DIAGNOSIS — N186 End stage renal disease: Secondary | ICD-10-CM | POA: Diagnosis not present

## 2024-01-21 DIAGNOSIS — Z992 Dependence on renal dialysis: Secondary | ICD-10-CM | POA: Diagnosis not present

## 2024-01-23 DIAGNOSIS — R52 Pain, unspecified: Secondary | ICD-10-CM | POA: Diagnosis not present

## 2024-01-23 DIAGNOSIS — Z23 Encounter for immunization: Secondary | ICD-10-CM | POA: Diagnosis not present

## 2024-01-23 DIAGNOSIS — N2581 Secondary hyperparathyroidism of renal origin: Secondary | ICD-10-CM | POA: Diagnosis not present

## 2024-01-23 DIAGNOSIS — D631 Anemia in chronic kidney disease: Secondary | ICD-10-CM | POA: Diagnosis not present

## 2024-01-23 DIAGNOSIS — Z992 Dependence on renal dialysis: Secondary | ICD-10-CM | POA: Diagnosis not present

## 2024-01-23 DIAGNOSIS — N186 End stage renal disease: Secondary | ICD-10-CM | POA: Diagnosis not present

## 2024-01-23 DIAGNOSIS — D509 Iron deficiency anemia, unspecified: Secondary | ICD-10-CM | POA: Diagnosis not present

## 2024-01-23 DIAGNOSIS — D689 Coagulation defect, unspecified: Secondary | ICD-10-CM | POA: Diagnosis not present

## 2024-01-24 DIAGNOSIS — R931 Abnormal findings on diagnostic imaging of heart and coronary circulation: Secondary | ICD-10-CM | POA: Diagnosis not present

## 2024-01-24 DIAGNOSIS — I517 Cardiomegaly: Secondary | ICD-10-CM | POA: Diagnosis not present

## 2024-01-24 DIAGNOSIS — I361 Nonrheumatic tricuspid (valve) insufficiency: Secondary | ICD-10-CM | POA: Diagnosis not present

## 2024-01-26 DIAGNOSIS — M545 Low back pain, unspecified: Secondary | ICD-10-CM | POA: Diagnosis not present

## 2024-01-26 DIAGNOSIS — M79643 Pain in unspecified hand: Secondary | ICD-10-CM | POA: Diagnosis not present

## 2024-01-26 DIAGNOSIS — N186 End stage renal disease: Secondary | ICD-10-CM | POA: Diagnosis not present

## 2024-01-27 DIAGNOSIS — D689 Coagulation defect, unspecified: Secondary | ICD-10-CM | POA: Diagnosis not present

## 2024-01-27 DIAGNOSIS — Z23 Encounter for immunization: Secondary | ICD-10-CM | POA: Diagnosis not present

## 2024-01-27 DIAGNOSIS — R52 Pain, unspecified: Secondary | ICD-10-CM | POA: Diagnosis not present

## 2024-01-27 DIAGNOSIS — Z992 Dependence on renal dialysis: Secondary | ICD-10-CM | POA: Diagnosis not present

## 2024-01-27 DIAGNOSIS — D631 Anemia in chronic kidney disease: Secondary | ICD-10-CM | POA: Diagnosis not present

## 2024-01-27 DIAGNOSIS — D509 Iron deficiency anemia, unspecified: Secondary | ICD-10-CM | POA: Diagnosis not present

## 2024-01-27 DIAGNOSIS — N186 End stage renal disease: Secondary | ICD-10-CM | POA: Diagnosis not present

## 2024-01-30 ENCOUNTER — Other Ambulatory Visit: Payer: Self-pay | Admitting: Student

## 2024-01-30 DIAGNOSIS — D509 Iron deficiency anemia, unspecified: Secondary | ICD-10-CM | POA: Diagnosis not present

## 2024-01-30 DIAGNOSIS — R52 Pain, unspecified: Secondary | ICD-10-CM | POA: Diagnosis not present

## 2024-01-30 DIAGNOSIS — D689 Coagulation defect, unspecified: Secondary | ICD-10-CM | POA: Diagnosis not present

## 2024-01-30 DIAGNOSIS — N186 End stage renal disease: Secondary | ICD-10-CM | POA: Diagnosis not present

## 2024-01-30 NOTE — Telephone Encounter (Signed)
 Medication sent to pharmacy

## 2024-02-01 DIAGNOSIS — Z23 Encounter for immunization: Secondary | ICD-10-CM | POA: Diagnosis not present

## 2024-02-01 DIAGNOSIS — D509 Iron deficiency anemia, unspecified: Secondary | ICD-10-CM | POA: Diagnosis not present

## 2024-02-01 DIAGNOSIS — N2581 Secondary hyperparathyroidism of renal origin: Secondary | ICD-10-CM | POA: Diagnosis not present

## 2024-02-01 DIAGNOSIS — Z992 Dependence on renal dialysis: Secondary | ICD-10-CM | POA: Diagnosis not present

## 2024-02-01 DIAGNOSIS — D631 Anemia in chronic kidney disease: Secondary | ICD-10-CM | POA: Diagnosis not present

## 2024-02-01 DIAGNOSIS — N186 End stage renal disease: Secondary | ICD-10-CM | POA: Diagnosis not present

## 2024-02-01 DIAGNOSIS — D689 Coagulation defect, unspecified: Secondary | ICD-10-CM | POA: Diagnosis not present

## 2024-02-01 DIAGNOSIS — R52 Pain, unspecified: Secondary | ICD-10-CM | POA: Diagnosis not present

## 2024-02-03 DIAGNOSIS — N2581 Secondary hyperparathyroidism of renal origin: Secondary | ICD-10-CM | POA: Diagnosis not present

## 2024-02-06 DIAGNOSIS — D509 Iron deficiency anemia, unspecified: Secondary | ICD-10-CM | POA: Diagnosis not present

## 2024-02-06 DIAGNOSIS — D689 Coagulation defect, unspecified: Secondary | ICD-10-CM | POA: Diagnosis not present

## 2024-02-06 DIAGNOSIS — Z23 Encounter for immunization: Secondary | ICD-10-CM | POA: Diagnosis not present

## 2024-02-06 DIAGNOSIS — Z992 Dependence on renal dialysis: Secondary | ICD-10-CM | POA: Diagnosis not present

## 2024-02-06 DIAGNOSIS — D631 Anemia in chronic kidney disease: Secondary | ICD-10-CM | POA: Diagnosis not present

## 2024-02-06 DIAGNOSIS — N186 End stage renal disease: Secondary | ICD-10-CM | POA: Diagnosis not present

## 2024-02-06 DIAGNOSIS — R52 Pain, unspecified: Secondary | ICD-10-CM | POA: Diagnosis not present

## 2024-02-08 DIAGNOSIS — D689 Coagulation defect, unspecified: Secondary | ICD-10-CM | POA: Diagnosis not present

## 2024-02-08 DIAGNOSIS — D631 Anemia in chronic kidney disease: Secondary | ICD-10-CM | POA: Diagnosis not present

## 2024-02-08 DIAGNOSIS — Z992 Dependence on renal dialysis: Secondary | ICD-10-CM | POA: Diagnosis not present

## 2024-02-08 DIAGNOSIS — D509 Iron deficiency anemia, unspecified: Secondary | ICD-10-CM | POA: Diagnosis not present

## 2024-02-08 DIAGNOSIS — Z23 Encounter for immunization: Secondary | ICD-10-CM | POA: Diagnosis not present

## 2024-02-08 DIAGNOSIS — N186 End stage renal disease: Secondary | ICD-10-CM | POA: Diagnosis not present

## 2024-02-08 DIAGNOSIS — R52 Pain, unspecified: Secondary | ICD-10-CM | POA: Diagnosis not present

## 2024-02-08 DIAGNOSIS — N2581 Secondary hyperparathyroidism of renal origin: Secondary | ICD-10-CM | POA: Diagnosis not present

## 2024-02-08 NOTE — Progress Notes (Deleted)
 02/08/2024 [name] [name] [id number] 1976-03-21  Referring provider: Bernadine Manos, MD Primary GI doctor: {[email or web address]:[id number]}  ASSESSMENT AND PLAN:  Screening colonoscopy  End-stage renal disease secondary to type 2 diabetes On dialysis Tuesday Thursday Saturday Follows with Dr. Gearline  Patient Care Team: Azadegan, Maryam, MD as PCP - General [place], Peter M, MD as PCP - Cardiology (Cardiology) Center, [facility]  HISTORY OF PRESENT ILLNESS: 48 y.o. female with a past medical history listed below presents for evaluation of colonoscopy,  end-stage renal disease.   *** Discussed the use of AI scribe software for clinical note transcription with the patient, who gave verbal consent to proceed.  History of Present Illness            She  reports that she quit smoking about 2 years ago. Her smoking use included cigarettes and e-cigarettes. She started smoking about 18 years ago. She has a 8 pack-year smoking history. She has never used smokeless tobacco. She reports current alcohol use. She reports that she does not currently use drugs after having used the following drugs: Marijuana.  RELEVANT GI HISTORY, IMAGING AND LABS: Results          CBC    Component Value Date/Time   WBC 7.9 11/01/2022 1149   RBC 4.44 11/01/2022 1149   HGB 13.2 11/01/2022 1149   HGB 10.3 (L) 09/10/2020 1212   HCT 38.6 11/01/2022 1149   HCT 32.2 (L) 09/10/2020 1212   PLT 219 11/01/2022 1149   PLT 285 09/10/2020 1212   MCV 86.9 11/01/2022 1149   MCV 82 09/10/2020 1212   MCH 29.7 11/01/2022 1149   MCHC 34.2 11/01/2022 1149   RDW 13.6 11/01/2022 1149   RDW 14.6 09/10/2020 1212   LYMPHSABS 1.6 11/01/2022 1149   MONOABS 0.8 11/01/2022 1149   EOSABS 0.0 11/01/2022 1149   BASOSABS 0.0 11/01/2022 1149   No results for input(s): HGB in the last 8760 hours.  CMP     Component Value Date/Time   NA 134 (L) 11/02/2022 0125   NA 142 09/10/2020 1212   K 3.5 11/02/2022 0125    CL 94 (L) 11/02/2022 0125   CO2 28 11/02/2022 0125   GLUCOSE 110 (H) 11/02/2022 0125   BUN 30 (H) 11/02/2022 0125   BUN 67 (H) 09/10/2020 1212   CREATININE 7.54 (H) 11/02/2022 0125   CREATININE 0.86 02/03/2014 1017   CALCIUM  9.9 11/02/2022 0125   PROT 7.9 10/31/2022 0229   PROT 7.0 05/12/2016 1610   ALBUMIN  3.3 (L) 11/02/2022 0125   ALBUMIN  3.6 05/12/2016 1610   AST 18 10/31/2022 0229   ALT 17 10/31/2022 0229   ALKPHOS 139 (H) 10/31/2022 0229   BILITOT 0.9 10/31/2022 0229   BILITOT 0.2 05/12/2016 1610   GFRNONAA 6 (L) 11/02/2022 0125   GFRNONAA 83 07/23/2013 1604   GFRAA 20 (L) 02/11/2020 1344   GFRAA >89 07/23/2013 1604      Latest Ref Rng & Units 11/02/2022    1:25 AM 11/01/2022   11:49 AM 11/01/2022    3:59 AM  Hepatic Function  Albumin  3.5 - 5.0 g/dL 3.3  3.9  3.9       Current Medications:   Current Outpatient Medications (Endocrine & Metabolic):    levothyroxine  (SYNTHROID ) 88 MCG tablet, Take 1 tablet (88 mcg total) by mouth daily.   MOUNJARO  15 MG/0.5ML Pen, Inject 15 mg below the skin once a week  Current Outpatient Medications (Cardiovascular):    amLODipine  (NORVASC ) 10 MG  tablet, Take 1 tablet (10 mg total) by mouth daily.   atorvastatin  (LIPITOR) 80 MG tablet, Take 1 tablet (80 mg total) by mouth daily.   furosemide  (LASIX ) 20 MG tablet, TAKE 1 TABLET BY MOUTH AS DIRECTED ON non dialysis DAYS   metoprolol  succinate (TOPROL -XL) 25 MG 24 hr tablet, Take 1 tablet (25 mg total) by mouth daily.  Current Outpatient Medications (Respiratory):    fluticasone  (FLONASE ) 50 MCG/ACT nasal spray, Place 1 spray into both nostrils daily. (Patient taking differently: Place 1 spray into both nostrils daily as needed for allergies.)  Current Outpatient Medications (Analgesics):    allopurinol  (ZYLOPRIM ) 100 MG tablet, Take 1 tablet by mouth 3 times a week. Take only after dialysis sessions. Do not take on non-dialysis days.   aspirin  81 MG chewable tablet, Chew 1 tablet (81  mg total) by mouth daily.   oxyCODONE -acetaminophen  (PERCOCET) 7.5-325 MG tablet, Take 1 tablet by mouth every 8 (eight) hours as needed for moderate pain (pain score 4-6).   Current Outpatient Medications (Other):    Benzoyl Peroxide  2.5 % CREA, Apply 1 drop topically daily.   blood glucose meter kit and supplies KIT, ICD 10- E11.69. Based on patient and insurance preference. Use up to four times daily as directed.   buPROPion  (WELLBUTRIN ) 100 MG tablet, Take 1 tablet (100 mg total) by mouth every other day.   diclofenac  Sodium (VOLTAREN ) 1 % GEL, APPLY 1 GRAM TOPICALLY TO AFFECTED AREA 4 TIMES DAILY AS NEEDED (Patient taking differently: Apply 1 g topically 4 (four) times daily as needed (for pain).)   hydrOXYzine  (ATARAX ) 25 MG tablet, Take 1 tablet (25 mg total) by mouth 3 (three) times daily as needed for anxiety.   Insulin  Pen Needle (TRUEPLUS PEN NEEDLES) 31G X 5 MM MISC, 1 each by Other route 3 (three) times daily.   ketoconazole  (NIZORAL ) 2 % cream, Apply 1 application topically to affected area daily.   ofloxacin  (OCUFLOX ) 0.3 % ophthalmic solution, Place 1 drop into the left eye in the morning and at bedtime.   sevelamer  carbonate (RENVELA ) 800 MG tablet, Take 6 to 7 tablets by mouth twice a day with meals   tretinoin  (RETIN-A ) 0.025 % cream, Apply topically to affected area at bedtime   Vitamin D , Ergocalciferol , (DRISDOL ) 1.25 MG (50000 UNIT) CAPS capsule, Take 1 capsule (50,000 Units total) by mouth every 7 (seven) days.  Medical History:  Past Medical History:  Diagnosis Date   Abdominal muscle pain 09/08/2016   Abnormal Pap smear of cervix 2009   Abscess    history of multiple abscesses   Acute bilateral low back pain 02/13/2017   Acute blood loss anemia    Hx  Iron  infusions   Acute on chronic renal failure (HCC) 07/12/2012   Acute renal failure (HCC) 07/12/2012   Acute respiratory failure with hypoxia (HCC) 10/21/2019   Adjustment disorder with depressed mood  03/26/2017   AKI (acute kidney injury) (HCC)    Dialysis t-th-s   Anemia 10/21/2019   Anemia of chronic disease 2002   Anginal pain (HCC)    Anxiety    Panic attacks   Bilateral lower extremity edema 05/13/2016   Bipolar disorder (HCC)    Carpal tunnel syndrome, bilateral 01/25/2012   Cataract    B/L cataract   Cellulitis 05/21/2014   right eye   CHF (congestive heart failure) (HCC)    Chronic bronchitis (HCC)    get it q yr (05/13/2013)   Chronic pain    Chronic  pain of right knee 09/08/2016   Crutches as ambulation aid    uses forearm crutchs, Left  AKA with prosthetic.   Dehiscence of amputation stump (HCC)    Depression    Edema of lower extremity    Endocarditis 2002   subacute bacterial endocarditis.    Fall    Family history of anesthesia complication    my mom has a hard time coming out from under   Fibromyalgia    GERD (gastroesophageal reflux disease)    occ   Heart murmur    never has had any problems   Herpes simplex type 1 infection 01/16/2018   Hidradenitis suppurativa 02/24/2021   History of blood transfusion    just low blood count (05/13/2013)   History of sexually transmitted infection    Hyperlipidemia    Hypertension    Hypothyroidism    Hypothyroidism, adult 03/21/2014   Hypoxia    Leukocytosis    Muscle spasm 12/16/2020   Necrosis (HCC)    and ulceration   Necrotizing fasciitis s/p OR debridements 07/06/2012   Obesity    OSA on CPAP    does not use CPAP   Peripheral neuropathy    Pneumonia    Pyelonephritis 12/31/2020   Routine adult health maintenance 12/08/2016   Severe protein-calorie malnutrition (HCC)    Status post below knee amputation, left (HCC) 04/11/2017   Type II diabetes mellitus (HCC)    Type  II   Vaginal irritation 03/14/2016   Weak pulse 07/21/2021   Wound dehiscence 09/04/2019   Allergies:  Allergies  Allergen Reactions   Vancomycin  Other (See Comments)    Acute renal failure suspected secondary to  vanco     Surgical History:  She  has a past surgical history that includes Incision and drainage abscess; Irrigation and debridement abscess (Left, 07/06/2012); Incision and drainage of wound (Left, 07/07/2012); Incision and drainage abscess (Left, 07/09/2012); Incision and drainage perirectal abscess (Left, 07/14/2012); Incision and drainage perirectal abscess (Left, 07/16/2012); Irrigation and debridement abscess (Left, 08/10/2012); Tonsillectomy (1994); Incision and drainage perirectal abscess (N/A, 01/05/2015); Amputation (Left, 04/07/2017); Eye surgery; Stump revision (Left, 06/09/2017); ABDOMINAL AORTOGRAM W/LOWER EXTREMITY (Left, 03/19/2019); Stump revision (Left, 09/04/2019); Stump revision (Left, 09/20/2019); Above knee leg amputaton (Left, 10/11/2019); Amputation (Left, 10/11/2019); Repair of complex traction retinal detachment (Right, 12/31/2019); Photocoagulation with laser (Right, 12/31/2019); Gas insertion (Right, 12/31/2019); Pars plana vitrectomy (Right, 12/31/2019); Air/fluid exchange (Right, 12/31/2019); Cataract extraction w/PHACO (Right, 02/11/2020); Insertion of dialysis catheter (Right, 01/05/2021); AV fistula placement (Left, 01/05/2021); Fistula superficialization (Left, 02/23/2021); A/V Fistulagram (Left, 05/12/2021); PERIPHERAL VASCULAR INTERVENTION (Left, 05/12/2021); Repair of complex traction retinal detachment (Left, 07/24/2022); Pars plana vitrectomy (Left, 07/24/2022); Membrane peel (Left, 07/24/2022); Photocoagulation (Left, 07/24/2022); LEFT HEART CATH AND CORONARY ANGIOGRAPHY (N/A, 10/31/2022); A/V SHUNT INTERVENTION (N/A, 10/25/2023); and VENOUS ANGIOPLASTY (10/25/2023). Family History:  Her family history includes Diabetes in her father, mother, and paternal grandmother; Heart failure in her mother and paternal grandmother; Kidney disease in her father and mother; Other in an other family member.  REVIEW OF SYSTEMS  : All other systems reviewed and negative except where noted in the  History of Present Illness.  PHYSICAL EXAM: There were no vitals taken for this visit. Physical Exam          Alan JONELLE Coombs, PA-C 1:23 PM

## 2024-02-09 ENCOUNTER — Ambulatory Visit: Admitting: Physician Assistant

## 2024-02-10 DIAGNOSIS — D631 Anemia in chronic kidney disease: Secondary | ICD-10-CM | POA: Diagnosis not present

## 2024-02-10 DIAGNOSIS — R52 Pain, unspecified: Secondary | ICD-10-CM | POA: Diagnosis not present

## 2024-02-10 DIAGNOSIS — N186 End stage renal disease: Secondary | ICD-10-CM | POA: Diagnosis not present

## 2024-02-10 DIAGNOSIS — N2581 Secondary hyperparathyroidism of renal origin: Secondary | ICD-10-CM | POA: Diagnosis not present

## 2024-02-10 DIAGNOSIS — D689 Coagulation defect, unspecified: Secondary | ICD-10-CM | POA: Diagnosis not present

## 2024-02-10 DIAGNOSIS — D509 Iron deficiency anemia, unspecified: Secondary | ICD-10-CM | POA: Diagnosis not present

## 2024-02-10 DIAGNOSIS — Z992 Dependence on renal dialysis: Secondary | ICD-10-CM | POA: Diagnosis not present

## 2024-02-13 DIAGNOSIS — D689 Coagulation defect, unspecified: Secondary | ICD-10-CM | POA: Diagnosis not present

## 2024-02-13 DIAGNOSIS — Z992 Dependence on renal dialysis: Secondary | ICD-10-CM | POA: Diagnosis not present

## 2024-02-13 DIAGNOSIS — R52 Pain, unspecified: Secondary | ICD-10-CM | POA: Diagnosis not present

## 2024-02-13 DIAGNOSIS — D509 Iron deficiency anemia, unspecified: Secondary | ICD-10-CM | POA: Diagnosis not present

## 2024-02-13 DIAGNOSIS — N186 End stage renal disease: Secondary | ICD-10-CM | POA: Diagnosis not present

## 2024-02-15 ENCOUNTER — Telehealth: Payer: Self-pay | Admitting: *Deleted

## 2024-02-15 DIAGNOSIS — Z992 Dependence on renal dialysis: Secondary | ICD-10-CM | POA: Diagnosis not present

## 2024-02-15 NOTE — Telephone Encounter (Signed)
 RTC to patient .  Asked patient if she has talked with the folks at the Dialysis Center.  Patient was also informed that a copy of the labs will Copied from CRM #[id number]. Topic: Clinical - Medical Advice >> Feb 15, 2024  9:19 AM Diannia H wrote: Reason for CRM: Patient called and is wanting to come in because her labs says her PH balance is high and she also said she has a odor even after she showers. The first available appointment on my end is October 6th but the patient stated she could not wait that long. Could you assist? Patients callback number is [phone].

## 2024-02-17 DIAGNOSIS — N2581 Secondary hyperparathyroidism of renal origin: Secondary | ICD-10-CM | POA: Diagnosis not present

## 2024-02-17 DIAGNOSIS — D509 Iron deficiency anemia, unspecified: Secondary | ICD-10-CM | POA: Diagnosis not present

## 2024-02-17 DIAGNOSIS — Z23 Encounter for immunization: Secondary | ICD-10-CM | POA: Diagnosis not present

## 2024-02-17 DIAGNOSIS — D631 Anemia in chronic kidney disease: Secondary | ICD-10-CM | POA: Diagnosis not present

## 2024-02-17 DIAGNOSIS — D689 Coagulation defect, unspecified: Secondary | ICD-10-CM | POA: Diagnosis not present

## 2024-02-17 DIAGNOSIS — R52 Pain, unspecified: Secondary | ICD-10-CM | POA: Diagnosis not present

## 2024-02-17 DIAGNOSIS — N186 End stage renal disease: Secondary | ICD-10-CM | POA: Diagnosis not present

## 2024-02-17 DIAGNOSIS — Z992 Dependence on renal dialysis: Secondary | ICD-10-CM | POA: Diagnosis not present

## 2024-02-20 ENCOUNTER — Telehealth: Payer: Self-pay

## 2024-02-20 DIAGNOSIS — E1122 Type 2 diabetes mellitus with diabetic chronic kidney disease: Secondary | ICD-10-CM | POA: Diagnosis not present

## 2024-02-20 DIAGNOSIS — Z23 Encounter for immunization: Secondary | ICD-10-CM | POA: Diagnosis not present

## 2024-02-20 DIAGNOSIS — D689 Coagulation defect, unspecified: Secondary | ICD-10-CM | POA: Diagnosis not present

## 2024-02-20 DIAGNOSIS — N186 End stage renal disease: Secondary | ICD-10-CM | POA: Diagnosis not present

## 2024-02-20 DIAGNOSIS — D509 Iron deficiency anemia, unspecified: Secondary | ICD-10-CM | POA: Diagnosis not present

## 2024-02-20 DIAGNOSIS — Z992 Dependence on renal dialysis: Secondary | ICD-10-CM | POA: Diagnosis not present

## 2024-02-20 NOTE — Telephone Encounter (Signed)
 Copied from CRM [id number]. Topic: Appointments - Scheduling Inquiry for Clinic >> Feb 19, 2024  3:48 PM Alfonso ORN wrote: Reason for CRM: patient requesting an appointment soon as possible , however can not do Tuesday or Thursday have dialysis those days   ph balance is off , not sure if coming from her getting dialysis urine is giving off an odor ,  patient is requesting a papsmear at this visit    ----------------------------------------------------------------------- From previous Reason for Contact - Scheduling: Patient/patient representative is calling to schedule an appointment. Refer to attachments for appointment information.

## 2024-02-20 NOTE — Telephone Encounter (Signed)
 Unable to reach pt to sch her appt. No appt slot  is open this week. I left detailed message to call the clinic back.

## 2024-02-27 ENCOUNTER — Other Ambulatory Visit: Payer: Self-pay | Admitting: Student

## 2024-02-27 DIAGNOSIS — M109 Gout, unspecified: Secondary | ICD-10-CM

## 2024-02-28 DIAGNOSIS — L03011 Cellulitis of right finger: Secondary | ICD-10-CM | POA: Diagnosis not present

## 2024-02-28 DIAGNOSIS — M79644 Pain in right finger(s): Secondary | ICD-10-CM | POA: Diagnosis not present

## 2024-03-01 DIAGNOSIS — G546 Phantom limb syndrome with pain: Secondary | ICD-10-CM | POA: Diagnosis not present

## 2024-03-01 DIAGNOSIS — Z992 Dependence on renal dialysis: Secondary | ICD-10-CM | POA: Diagnosis not present

## 2024-03-01 DIAGNOSIS — M79643 Pain in unspecified hand: Secondary | ICD-10-CM | POA: Diagnosis not present

## 2024-03-25 ENCOUNTER — Other Ambulatory Visit: Payer: Self-pay | Admitting: Internal Medicine

## 2024-03-25 ENCOUNTER — Encounter: Payer: Self-pay | Admitting: Radiology

## 2024-03-25 NOTE — Telephone Encounter (Signed)
 Last rx prescribed by a provider that is no longer with [facility].  Medication sent to pharmacy.

## 2024-03-26 ENCOUNTER — Other Ambulatory Visit: Payer: Self-pay | Admitting: Student

## 2024-03-26 NOTE — Telephone Encounter (Signed)
 Copied from CRM #[id number]. Topic: Clinical - Medication Refill >> Mar 26, 2024 10:11 AM Debby BROCKS wrote: Medication: atorvastatin  (LIPITOR) 80 MG tablet  Has the patient contacted their pharmacy? Yes (Agent: If no, request that the patient contact the pharmacy for the refill. If patient does not wish to contact the pharmacy document the reason why and proceed with request.) (Agent: If yes, when and what did the pharmacy advise?)  Pharmacy is the one calling in the refill request  This is the patient's preferred pharmacy:  [facility] - [place], [place] - [address] [place] [address] Phone: [phone] Fax: [phone]  Is this the correct pharmacy for this prescription? Yes If no, delete pharmacy and type the correct one.   Has the prescription been filled recently? No  Is the patient out of the medication? No  Has the patient been seen for an appointment in the last year OR does the patient have an upcoming appointment? Yes  Can we respond through MyChart? Yes  Agent: Please be advised that Rx refills may take up to 3 business days. We ask that you follow-up with your pharmacy.

## 2024-04-02 MED ORDER — ATORVASTATIN CALCIUM 80 MG PO TABS: 80.0000 mg | ORAL_TABLET | Freq: Every day | ORAL | 0 refills | Status: DC

## 2024-04-24 ENCOUNTER — Ambulatory Visit

## 2024-04-29 ENCOUNTER — Other Ambulatory Visit: Payer: Self-pay

## 2024-04-29 NOTE — Telephone Encounter (Signed)
 Pt overdue for visit Attempted to make an appt  No answer, message left on recorder.

## 2024-05-06 ENCOUNTER — Ambulatory Visit

## 2024-05-06 ENCOUNTER — Other Ambulatory Visit: Payer: Self-pay

## 2024-05-06 ENCOUNTER — Other Ambulatory Visit (HOSPITAL_COMMUNITY)
Admission: RE | Admit: 2024-05-06 | Discharge: 2024-05-06 | Disposition: A | Source: Ambulatory Visit | Attending: Internal Medicine | Admitting: Internal Medicine

## 2024-05-06 VITALS — BP 126/72 | HR 89 | Temp 98.0°F | Ht 69.5 in | Wt 222.0 lb

## 2024-05-06 DIAGNOSIS — Z89612 Acquired absence of left leg above knee: Secondary | ICD-10-CM | POA: Diagnosis not present

## 2024-05-06 DIAGNOSIS — B9689 Other specified bacterial agents as the cause of diseases classified elsewhere: Secondary | ICD-10-CM

## 2024-05-06 DIAGNOSIS — E1159 Type 2 diabetes mellitus with other circulatory complications: Secondary | ICD-10-CM

## 2024-05-06 DIAGNOSIS — N760 Acute vaginitis: Secondary | ICD-10-CM | POA: Diagnosis present

## 2024-05-06 MED ORDER — METRONIDAZOLE 500 MG PO TABS: 500.0000 mg | ORAL_TABLET | Freq: Two times a day (BID) | ORAL | 0 refills | Status: AC

## 2024-05-06 NOTE — Progress Notes (Signed)
 CC: Acute Concern of vaginal odor and discharge  HPI:  [name] [name] is a 48 y.o. female with pertinent PMH of TIIDM with HTN and ESRD and dialysis, previous history of BV, history of trichomonas, and left knee aka who presents for abnormal vaginal discharge. Please see problem based assessment and plan for further history.  ROS  Medications: Current Outpatient Medications  Medication Instructions   allopurinol  (ZYLOPRIM ) 100 MG tablet Take 1 tablet by mouth 3 times a week. Take only after dialysis sessions. Do not take on non-dialysis days.   amLODipine  (NORVASC ) 10 mg, Oral, Daily   Aspirin  Low Dose 81 mg, Oral, Daily   atorvastatin  (LIPITOR) 80 mg, Oral, Daily   Benzoyl Peroxide  2.5 % CREA 1 drop, Apply externally, Daily   blood glucose meter kit and supplies KIT ICD 10- E11.69. Based on patient and insurance preference. Use up to four times daily as directed.   buPROPion  (WELLBUTRIN ) 100 mg, Oral, Every 48 hours   diclofenac  Sodium (VOLTAREN ) 1 % GEL APPLY 1 GRAM TOPICALLY TO AFFECTED AREA 4 TIMES DAILY AS NEEDED   fluticasone  (FLONASE ) 50 MCG/ACT nasal spray 1 spray, Each Nare, Daily   furosemide  (LASIX ) 20 MG tablet TAKE 1 TABLET BY MOUTH AS DIRECTED ON non dialysis DAYS   hydrOXYzine  (ATARAX ) 25 mg, Oral, 3 times daily PRN   Insulin  Pen Needle (TRUEPLUS PEN NEEDLES) 31G X 5 MM MISC 1 each, Other, 3 times daily   ketoconazole  (NIZORAL ) 2 % cream Apply 1 application topically to affected area daily.   levothyroxine  (SYNTHROID ) 88 mcg, Oral, Daily   metoprolol  succinate (TOPROL -XL) 25 mg, Oral, Daily   metroNIDAZOLE  (FLAGYL ) 500 mg, Oral, 2 times daily   MOUNJARO  15 MG/0.5ML Pen Inject 15 mg below the skin once a week   ofloxacin  (OCUFLOX ) 0.3 % ophthalmic solution 1 drop, Left Eye, 2 times daily   oxyCODONE -acetaminophen  (PERCOCET) 7.5-325 MG tablet 1 tablet, Oral, Every 8 hours PRN   sevelamer  carbonate (RENVELA ) 800 MG tablet Take 6 to 7 tablets by mouth twice a day with  meals   tretinoin  (RETIN-A ) 0.025 % cream Apply topically to affected area at bedtime   Vitamin D  (Ergocalciferol ) (DRISDOL ) 50,000 Units, Oral, Every 7 days     Physical Exam:  Vitals:   05/06/24 1534  BP: 126/72  Pulse: 89  Temp: 98 F (36.7 C)  TempSrc: Oral  SpO2: 100%  Weight: 222 lb (100.7 kg)  Height: 5' 9.5 (1.765 m)    Physical Exam Constitutional:      General: She is not in acute distress.    Appearance: She is not ill-appearing.  Cardiovascular:     Rate and Rhythm: Normal rate and regular rhythm.  Pulmonary:     Effort: Pulmonary effort is normal.     Breath sounds: Normal breath sounds.  Abdominal:     General: There is no distension.     Tenderness: There is no abdominal tenderness.  Genitourinary:    Comments: Deferred per patient preference Neurological:     Mental Status: She is alert.       Assessment & Plan:   Assessment & Plan Bacterial vaginosis Patient states that she has noticed a fishy, foul odor over the past 2 days that is associated with a grayish-white discharge.  Discharge is thin.  She states that it seems just like when she had BV 1 time ago.  She does not have any urinary symptoms.  She denies itching or systemic symptoms.  She is not  having pain.  There is no bloody discharge. She has not been sexually active in 1 year.  She states that she has had yeast infections before and that it does not seem to be similar to this.  She deferred physical exam at today's visit.  She did consent to a self swab and we will send this for BV and yeast.  There is very little likelihood that this to be a sexually transmitted infection as she has not been sexually active in over a year and given the short acuity of her symptoms.  After shared decision making, we elected to treat her with metronidazole  500 mg twice daily for 1 week.  Will call her with results of her swab.  - Metronidazole  500 mg twice daily for 1 week -Follow-up BV/yeast Hx of AKA (above  knee amputation), left (HCC) Patient with a history of AKA.  She has had a large amount of weight loss over the past year and her socket is no longer fitting her well.  We will refer her to [facility] for a new socket. - Referral placed for DME  Orders Placed This Encounter  Procedures   Ambulatory Referral for DME    Referral Priority:   Routine    Referral Type:   Durable Medical Equipment Purchase    Number of Visits Requested:   1     Patient seen with Dr. Jone Dauphin   Melvenia Morrison, MD [facility] Internal Medicine Resident PGY-1 Clinic Phone: [phone] Please contact the on call pager at [phone] for any urgent or emergent needs.

## 2024-05-06 NOTE — Patient Instructions (Signed)
 Thank you, [name], for allowing us  to provide your care today. Today we discussed . . .  > Bacterial Vaginosis       - For your bacterial vaginosis, we prescribed metronidazole . > Amputation       - We ordered a referral to the [facility] for a new socket   I have ordered the following labs for you:  Lab Orders  No laboratory test(s) ordered today      Referrals ordered today:   Referral Orders         Ambulatory Referral for DME       Follow up: PRN    Remember:  Should you have any questions or concerns please call the internal medicine clinic at [phone].     Melvenia Morrison, [facility]

## 2024-05-06 NOTE — Assessment & Plan Note (Signed)
 Patient with a history of AKA.  She has had a large amount of weight loss over the past year and her socket is no longer fitting her well.  We will refer her to [facility] for a new socket. - Referral placed for DME

## 2024-05-08 ENCOUNTER — Ambulatory Visit: Payer: Self-pay

## 2024-05-08 LAB — CERVICOVAGINAL ANCILLARY ONLY
Bacterial Vaginitis (gardnerella): POSITIVE — AB
Candida Glabrata: NEGATIVE
Candida Vaginitis: NEGATIVE
Comment: NEGATIVE
Comment: NEGATIVE
Comment: NEGATIVE

## 2024-05-08 NOTE — Progress Notes (Signed)
 Internal Medicine Clinic Attending  I was physically present during the key portions of the resident provided service and participated in the medical decision making of patient's management care. I reviewed pertinent patient test results.  The assessment, diagnosis, and plan were formulated together and I agree with the documentation in the resident's note.  Shawn Sick, MD

## 2024-05-21 ENCOUNTER — Other Ambulatory Visit: Payer: Self-pay | Admitting: Student

## 2024-05-21 ENCOUNTER — Other Ambulatory Visit: Payer: Self-pay

## 2024-05-21 DIAGNOSIS — I152 Hypertension secondary to endocrine disorders: Secondary | ICD-10-CM

## 2024-05-21 NOTE — Telephone Encounter (Signed)
 Medication sent to pharmacy

## 2024-05-29 ENCOUNTER — Other Ambulatory Visit (HOSPITAL_COMMUNITY)
Admission: RE | Admit: 2024-05-29 | Discharge: 2024-05-29 | Disposition: A | Discharge status: Home / Self Care | Source: Ambulatory Visit | Attending: Internal Medicine | Admitting: Internal Medicine

## 2024-05-29 ENCOUNTER — Telehealth: Payer: Self-pay

## 2024-05-29 ENCOUNTER — Ambulatory Visit (INDEPENDENT_AMBULATORY_CARE_PROVIDER_SITE_OTHER)

## 2024-05-29 ENCOUNTER — Other Ambulatory Visit (HOSPITAL_COMMUNITY): Payer: Self-pay

## 2024-05-29 VITALS — BP 119/70 | HR 76 | Temp 98.0°F | Ht 69.5 in | Wt 221.6 lb

## 2024-05-29 DIAGNOSIS — K610 Anal abscess: Secondary | ICD-10-CM

## 2024-05-29 DIAGNOSIS — Z7985 Long-term (current) use of injectable non-insulin antidiabetic drugs: Secondary | ICD-10-CM

## 2024-05-29 DIAGNOSIS — E1151 Type 2 diabetes mellitus with diabetic peripheral angiopathy without gangrene: Secondary | ICD-10-CM

## 2024-05-29 DIAGNOSIS — Z139 Encounter for screening, unspecified: Secondary | ICD-10-CM

## 2024-05-29 DIAGNOSIS — Z7251 High risk heterosexual behavior: Secondary | ICD-10-CM | POA: Diagnosis present

## 2024-05-29 DIAGNOSIS — E1159 Type 2 diabetes mellitus with other circulatory complications: Secondary | ICD-10-CM

## 2024-05-29 LAB — POCT GLYCOSYLATED HEMOGLOBIN (HGB A1C): HbA1c, POC (controlled diabetic range): 4.4 % (ref 0.0–7.0)

## 2024-05-29 LAB — GLUCOSE, CAPILLARY: Glucose-Capillary: 135 mg/dL — ABNORMAL HIGH (ref 70–99)

## 2024-05-29 MED ORDER — AMOXICILLIN-POT CLAVULANATE 500-125 MG PO TABS: 1.0000 | ORAL_TABLET | Freq: Every day | ORAL | 0 refills | Status: AC

## 2024-05-29 NOTE — Telephone Encounter (Signed)
 Prior Authorization for patient (Zepbound 2.5MG /0.5ML pen-injectors) came through on cover my meds was submitted with last office notes and sleep study awaiting approval or denial.  KEY:[id number]

## 2024-05-29 NOTE — Patient Instructions (Addendum)
 Thank you, Ms.Corean Meier for allowing us  to provide your care today.  Today we talked about several important parts of your health. We reviewed your diabetes and checked your hemoglobin A1c, as well as your blood sugar. We also completed a foot exam and discussed the importance of routine foot care. A referral was sent to Ophthalmology so you can have your routine diabetic eye exam to screen for diabetic retinopathy. We are also working on prior authorization and medication adjustment for your diabetes treatment, and we will update you once that process is complete.  We also discussed the pain around the anal area. On exam, there was a small, superficial abscess. Because you do not have fever, chills, or other systemic symptoms, we will treat this with a short course of antibiotics. You were prescribed Augmentin , and the dose was carefully adjusted based on your kidney function. Please take this medication once daily, and on dialysis days, take it after your dialysis session, for a total of 7 days. After you finish the antibiotic, please send us  a message in MyChart or call the clinic with an update. If the pain or swelling does not improve, drainage may be needed, and we would want to see you again.  Because of a recent unprotected sexual encounter, we ordered routine sexually transmitted infection testing, including HIV, hepatitis C, syphilis, gonorrhea, and chlamydia. We will notify you as soon as the results are available.  To help stay up to date with your preventive care, we also placed a referral for a screening colonoscopy.  Please continue taking your medications as prescribed and attending your dialysis sessions as scheduled. If you develop fever, chills, worsening pain, increasing swelling, drainage, rash, severe diarrhea, or if anything does not feel right, please contact our clinic right away.  Thank you again for trusting us  with your care. If you have any questions or concerns,  please call the Internal Medicine Clinic or send us  a message through MyChart.  I have ordered the following labs for you:  Lab Orders         Glucose, capillary         RPR         CBC with Diff         Hepatitis C Ab reflex to Quant PCR         HIV antibody (with reflex)         Lipid Profile         POC Hbg A1C       Referrals ordered today:   Referral Orders         Amb Referral to Colonoscopy         Ambulatory referral to Ophthalmology      I have ordered the following medication/changed the following medications:   Stop the following medications: There are no discontinued medications.   Start the following medications: Meds ordered this encounter  Medications   amoxicillin -clavulanate (AUGMENTIN ) 500-125 MG tablet    Sig: Take 1 tablet by mouth daily.    Dispense:  7 tablet    Refill:  0     Follow up: 1 month   Remember:   Should you have any questions or concerns please call the internal medicine clinic at [phone].    Armando Rossetti, M.D [facility]

## 2024-05-29 NOTE — Assessment & Plan Note (Signed)
 Patient with long-standing type 2 diabetes and multiple comorbidities, including ESRD on hemodialysis. She presents today for routine diabetes follow-up. She denies symptoms of hypoglycemia or hyperglycemia, including dizziness, diaphoresis, polyuria, or polydipsia. Medication adherence is reported. Foot exam was completed today without acute ulceration or infection. Hemoglobin A1c was obtained to assess current glycemic control.  Although she remains on Mounjaro , she has not had significant recent weight loss, prompting reassessment of ongoing therapy and insurance authorization for Zepbound.  Plan: Check POC hemoglobin A1c and capillary glucose today Proceed with prior authorization and medication adjustment as indicated based on response and coverage Reinforce daily foot care and routine podiatry as needed Ophthalmology referral placed for diabetic retinopathy screening

## 2024-05-29 NOTE — Assessment & Plan Note (Signed)
 Patient presents with localized perianal pain and tenderness. On exam, there is a small, superficial perianal abscess without fluctuance, active drainage, surrounding cellulitis, or signs of deeper perirectal involvement. She is afebrile and denies chills, nausea, vomiting, or other systemic symptoms. Given the limited size, superficial nature, and absence of systemic illness, there is low concern for complicated or deep perianal/perirectal infection at this time.  Immediate I&D is deferred due to the small size, lack of fluctuance, and minimal surrounding inflammation. Additionally, the patient has multiple risk factors for infection, including diabetes, ESRD on hemodialysis, and a history of recurrent abscesses, which supports close monitoring and early intervention if symptoms worsen.  Plan: Treat conservatively with Augmentin  500/125 mg PO once daily, renally dosed for ESRD On hemodialysis days, instruct patient to take antibiotic after dialysis Duration 7 days Recommend warm compresses and gentle local hygiene to promote spontaneous drainage Educated patient on signs of progression, including increasing pain, swelling, erythema, drainage, fever, or chills Patient instructed to contact clinic via MyChart or phone after completing antibiotics to report symptom status If symptoms persist or worsen, will re-evaluate for I&D and consider surgical referral

## 2024-05-29 NOTE — Telephone Encounter (Signed)
 Afia Appelt (Key: [id number]) Zepbound 2.5MG /0.5ML pen-injectors Form [employer] Part D Electronic Prior Authorization Form (2017 NCPDP) Created 2 hours ago Sent to Plan 2 hours ago Plan Response 2 hours ago Submit Clinical Questions 2 hours ago Determination Favorable 7 minutes ago Message from Plan Request Reference Number: [id number]. ZEPBOUND INJ 2.5/0.5 is approved through 05/22/2025. Your patient may now fill this prescription and it will be covered.. Authorization Expiration Date: May 22, 2025.   Lvm for patient regarding the approval.  Dr.Azadegan can you send a rx to [facility] pharmacy, the patient would like to get the medication delivered to her home.

## 2024-05-29 NOTE — Progress Notes (Signed)
 "  CC:  Routine follow-up and Perianal pain  HPI:  [name] [name] is a 49 y.o. female living with a history including ESRD on HD(TTS), type 2 diabetes mellitus, hypertension, hypothyroidism, hyperlipidemia, left lower extremity amputation, and history of recurrent abscesses, who presents today for evaluation of perianal pain, diabetes follow-up, and preventive care.  She reports pain and tenderness around the perianal area over the past several days. She denies fever, chills, nausea, vomiting, drainage, or other systemic symptoms. She has a history of multiple prior abscesses and notes that this feels similar but smaller. She denies rectal bleeding or changes in bowel habits.  She is also here for routine follow-up of her diabetes. She denies symptoms of hypoglycemia or hyperglycemia. She reports adherence to her medications and dialysis schedule.  Additionally, she reports a recent unprotected sexual encounter and requests screening for sexually transmitted infections. She currently denies vaginal discharge, dysuria, pelvic pain, or abnormal bleeding.  She denies chest pain, shortness of breath, dizziness, or new neurologic symptoms.   Please see problem-based assessment and plan for additional details. Past Medical History:  Diagnosis Date   Abdominal muscle pain 09/08/2016   Abnormal Pap smear of cervix 2009   Abscess    history of multiple abscesses   Acute bilateral low back pain 02/13/2017   Acute blood loss anemia    Hx  Iron  infusions   Acute on chronic renal failure 07/12/2012   Acute renal failure 07/12/2012   Acute respiratory failure with hypoxia (HCC) 10/21/2019   Adjustment disorder with depressed mood 03/26/2017   AKI (acute kidney injury)    Dialysis t-th-s   Anemia 10/21/2019   Anemia of chronic disease 2002   Anginal pain    Anxiety    Panic attacks   Bilateral lower extremity edema 05/13/2016   Bipolar disorder (HCC)    Carpal tunnel syndrome, bilateral  01/25/2012   Cataract    B/L cataract   Cellulitis 05/21/2014   right eye   CHF (congestive heart failure) (HCC)    Chronic bronchitis (HCC)    get it q yr (05/13/2013)   Chronic pain    Chronic pain of right knee 09/08/2016   Crutches as ambulation aid    uses forearm crutchs, Left  AKA with prosthetic.   Dehiscence of amputation stump (HCC)    Depression    Edema of lower extremity    Endocarditis 2002   subacute bacterial endocarditis.    Fall    Family history of anesthesia complication    my mom has a hard time coming out from under   Fibromyalgia    GERD (gastroesophageal reflux disease)    occ   Heart murmur    never has had any problems   Herpes simplex type 1 infection 01/16/2018   Hidradenitis suppurativa 02/24/2021   History of blood transfusion    just low blood count (05/13/2013)   History of sexually transmitted infection    Hyperlipidemia    Hypertension    Hypothyroidism    Hypothyroidism, adult 03/21/2014   Hypoxia    Leukocytosis    Muscle spasm 12/16/2020   Necrosis (HCC)    and ulceration   Necrotizing fasciitis s/p OR debridements 07/06/2012   Obesity    OSA on CPAP    does not use CPAP   Peripheral neuropathy    Pneumonia    Pyelonephritis 12/31/2020   Routine adult health maintenance 12/08/2016   Severe protein-calorie malnutrition    Status post below knee amputation,  left (HCC) 04/11/2017   Type II diabetes mellitus (HCC)    Type  II   Vaginal irritation 03/14/2016   Weak pulse 07/21/2021   Wound dehiscence 09/04/2019    Medications Ordered Prior to Encounter[1]  Family History  Problem Relation Age of Onset   Heart failure Mother    Diabetes Mother    Kidney disease Mother    Kidney disease Father    Diabetes Father    Diabetes Paternal Grandmother    Heart failure Paternal Grandmother    Other Other     Social History   Socioeconomic History   Marital status: Single    Spouse name: Not on file   Number of  children: 0   Years of education: 11   Highest education level: Not on file  Occupational History   Occupation: disability  Tobacco Use   Smoking status: Former    Current packs/day: 0.00    Average packs/day: 0.5 packs/day for 16.0 years (8.0 ttl pk-yrs)    Types: Cigarettes, E-cigarettes    Start date: 08/2005    Quit date: 08/2021    Years since quitting: 2.7   Smokeless tobacco: Never  Vaping Use   Vaping status: Some Days   Substances: Nicotine , THC, CBD, Flavoring   Devices: CBD oil  Substance and Sexual Activity   Alcohol use: Yes    Alcohol/week: 0.0 standard drinks of alcohol    Comment: social   Drug use: Not Currently    Types: Marijuana   Sexual activity: Yes    Birth control/protection: Condom  Other Topics Concern   Not on file  Social History Narrative   Lives with mother currently, goddaughter and uncle in a one story home.     On disability for diabetes, neuropathy, sleep apnea etc.  Disability since 2014.    Education: 11th grade.    Social Drivers of Health   Tobacco Use: High Risk (02/28/2024)   Received from [facility]   Patient History    Smoking Tobacco Use: Every Day    Smokeless Tobacco Use: Never    Passive Exposure: Not on file  Financial Resource Strain: High Risk (12/28/2022)   Overall Financial Resource Strain (CARDIA)    Difficulty of Paying Living Expenses: Hard  Food Insecurity: Food Insecurity Present (12/28/2022)   Hunger Vital Sign    Worried About Running Out of Food in the Last Year: Often true    Ran Out of Food in the Last Year: Often true  Transportation Needs: Unmet Transportation Needs (12/28/2022)   PRAPARE - Administrator, Civil Service (Medical): Yes    Lack of Transportation (Non-Medical): Yes  Physical Activity: Inactive (12/28/2022)   Exercise Vital Sign    Days of Exercise per Week: 0 days    Minutes of Exercise per Session: 0 min  Stress: No Stress Concern Present (12/28/2022)   [employer] of  Occupational Health - Occupational Stress Questionnaire    Feeling of Stress : Only a little  Social Connections: Moderately Isolated (12/28/2022)   Social Connection and Isolation Panel    Frequency of Communication with Friends and Family: More than three times a week    Frequency of Social Gatherings with Friends and Family: Once a week    Attends Religious Services: 1 to 4 times per year    Active Member of [employer] or Organizations: No    Attends Banker Meetings: Never    Marital Status: Widowed  Intimate Partner Violence: Not At  Risk (12/28/2022)   Humiliation, Afraid, Rape, and Kick questionnaire    Fear of Current or Ex-Partner: No    Emotionally Abused: No    Physically Abused: No    Sexually Abused: No  Depression (PHQ2-9): Low Risk (05/29/2024)   Depression (PHQ2-9)    PHQ-2 Score: 0  Alcohol Screen: Low Risk (12/28/2022)   Alcohol Screen    Last Alcohol Screening Score (AUDIT): 0  Housing: Low Risk (11/03/2022)   Housing    Last Housing Risk Score: 0  Utilities: At Risk (12/28/2022)   [facility] Utilities    Threatened with loss of utilities: Yes  Health Literacy: Adequate Health Literacy (12/28/2022)   B1300 Health Literacy    Frequency of need for help with medical instructions: Never    Review of Systems: ROS negative except for what is noted on the assessment and plan.  Vitals:   05/29/24 1309  BP: 119/70  Pulse: 76  Temp: 98 F (36.7 C)  TempSrc: Oral  SpO2: 99%  Weight: 221 lb 9.6 oz (100.5 kg)  Height: 5' 9.5 (1.765 m)    Physical Exam  Physical Exam: Constitutional: well-appearing female, sitting comfortably on exam table, in no acute distress HENT: normocephalic, atraumatic; mucous membranes moist Eyes: conjunctiva non-erythematous Neck: supple Cardiovascular: regular rate and rhythm, no murmurs, rubs, or gallops Pulmonary/Chest: normal work of breathing on room air, lungs clear to auscultation bilaterally Abdominal: soft, non-tender,  non-distended MSK: left lower extremity amputation with prosthesis in place; no acute joint swelling or tenderness Neurological: alert and oriented x3, moving all extremities appropriately Skin: warm and dry; small area of tenderness in the perianal region consistent with a superficial abscess, without fluctuance or drainage Psych: calm, cooperative, appropriate mood and affect  Assessment & Plan:   Perianal abscess Patient presents with localized perianal pain and tenderness. On exam, there is a small, superficial perianal abscess without fluctuance, active drainage, surrounding cellulitis, or signs of deeper perirectal involvement. She is afebrile and denies chills, nausea, vomiting, or other systemic symptoms. Given the limited size, superficial nature, and absence of systemic illness, there is low concern for complicated or deep perianal/perirectal infection at this time.  Immediate I&D is deferred due to the small size, lack of fluctuance, and minimal surrounding inflammation. Additionally, the patient has multiple risk factors for infection, including diabetes, ESRD on hemodialysis, and a history of recurrent abscesses, which supports close monitoring and early intervention if symptoms worsen.  Plan: Treat conservatively with Augmentin  500/125 mg PO once daily, renally dosed for ESRD On hemodialysis days, instruct patient to take antibiotic after dialysis Duration 7 days Recommend warm compresses and gentle local hygiene to promote spontaneous drainage Educated patient on signs of progression, including increasing pain, swelling, erythema, drainage, fever, or chills Patient instructed to contact clinic via MyChart or phone after completing antibiotics to report symptom status If symptoms persist or worsen, will re-evaluate for I&D and consider surgical referral   Diabetes mellitus type 2 with peripheral artery disease (HCC) Patient with long-standing type 2 diabetes and multiple  comorbidities, including ESRD on hemodialysis. She presents today for routine diabetes follow-up. She denies symptoms of hypoglycemia or hyperglycemia, including dizziness, diaphoresis, polyuria, or polydipsia. Medication adherence is reported. Foot exam was completed today without acute ulceration or infection. Hemoglobin A1c was obtained to assess current glycemic control.  Although she remains on Mounjaro , she has not had significant recent weight loss, prompting reassessment of ongoing therapy and insurance authorization for Zepbound.  Plan: Check POC hemoglobin A1c and capillary  glucose today Proceed with prior authorization and medication adjustment as indicated based on response and coverage Reinforce daily foot care and routine podiatry as needed Ophthalmology referral placed for diabetic retinopathy screening    Unprotected sexual intercourse Patient reports a recent episode of unprotected sexual intercourse. She is currently asymptomatic, denying vaginal discharge, dysuria, pelvic pain, abnormal bleeding, rash, or systemic symptoms. Given recent exposure and patient concern, screening for sexually transmitted infections is appropriate.  Plan: Order STI screening, including: HIV antibody  Hepatitis C  RPR  Gonorrhea and chlamydia  Counsel patient on safe sexual practices, including consistent condom use Advise abstinence or barrier protection until test results are available Plan to notify patient promptly of results and treat as indicated   Patient discussed with Dr. Francesco Armando Rossetti M.D [facility], PGY-1 Phone: [phone] Date 05/29/2024 Time 4:44 PM      [1]  Current Outpatient Medications on File Prior to Visit  Medication Sig Dispense Refill   allopurinol  (ZYLOPRIM ) 100 MG tablet Take 1 tablet by mouth 3 times a week. Take only after dialysis sessions. Do not take on non-dialysis days. 30 tablet 1   amLODipine  (NORVASC ) 10 MG tablet  TAKE 1 TABLET BY MOUTH DAILY 90 tablet 1   aspirin  81 MG chewable tablet Chew 1 tablet (81 mg total) by mouth daily. 90 tablet 0   atorvastatin  (LIPITOR) 80 MG tablet Take 1 tablet (80 mg total) by mouth daily. 90 tablet 0   Benzoyl Peroxide  2.5 % CREA Apply 1 drop topically daily. 21 g 0   blood glucose meter kit and supplies KIT ICD 10- E11.69. Based on patient and insurance preference. Use up to four times daily as directed. 1 each 0   buPROPion  (WELLBUTRIN ) 100 MG tablet Take 1 tablet (100 mg total) by mouth every other day. 30 tablet 4   diclofenac  Sodium (VOLTAREN ) 1 % GEL APPLY 1 GRAM TOPICALLY TO AFFECTED AREA 4 TIMES DAILY AS NEEDED (Patient taking differently: Apply 1 g topically 4 (four) times daily as needed (for pain).) 100 g 2   fluticasone  (FLONASE ) 50 MCG/ACT nasal spray Place 1 spray into both nostrils daily. (Patient taking differently: Place 1 spray into both nostrils daily as needed for allergies.) 11.1 mL 2   furosemide  (LASIX ) 20 MG tablet TAKE 1 TABLET BY MOUTH AS DIRECTED ON non dialysis DAYS 20 tablet 3   hydrOXYzine  (ATARAX ) 25 MG tablet Take 1 tablet (25 mg total) by mouth 3 (three) times daily as needed for anxiety. 90 tablet 0   Insulin  Pen Needle (TRUEPLUS PEN NEEDLES) 31G X 5 MM MISC 1 each by Other route 3 (three) times daily. 100 each 5   ketoconazole  (NIZORAL ) 2 % cream Apply 1 application topically to affected area daily. 15 g 2   levothyroxine  (SYNTHROID ) 88 MCG tablet Take 1 tablet (88 mcg total) by mouth daily.     metoprolol  succinate (TOPROL -XL) 25 MG 24 hr tablet Take 1 tablet (25 mg total) by mouth daily. 90 tablet 1   MOUNJARO  15 MG/0.5ML Pen Inject 15 mg below the skin once a week 2 mL 11   ofloxacin  (OCUFLOX ) 0.3 % ophthalmic solution Place 1 drop into the left eye in the morning and at bedtime.     oxyCODONE -acetaminophen  (PERCOCET) 7.5-325 MG tablet Take 1 tablet by mouth every 8 (eight) hours as needed for moderate pain (pain score 4-6). 30 tablet 0    sevelamer  carbonate (RENVELA ) 800 MG tablet Take 6 to 7 tablets  by mouth twice a day with meals     tretinoin  (RETIN-A ) 0.025 % cream Apply topically to affected area at bedtime 45 g 0   Vitamin D , Ergocalciferol , (DRISDOL ) 1.25 MG (50000 UNIT) CAPS capsule Take 1 capsule (50,000 Units total) by mouth every 7 (seven) days. 9 capsule 0   No current facility-administered medications on file prior to visit.   "

## 2024-05-29 NOTE — Assessment & Plan Note (Signed)
 Patient reports a recent episode of unprotected sexual intercourse. She is currently asymptomatic, denying vaginal discharge, dysuria, pelvic pain, abnormal bleeding, rash, or systemic symptoms. Given recent exposure and patient concern, screening for sexually transmitted infections is appropriate.  Plan: Order STI screening, including: HIV antibody  Hepatitis C  RPR  Gonorrhea and chlamydia  Counsel patient on safe sexual practices, including consistent condom use Advise abstinence or barrier protection until test results are available Plan to notify patient promptly of results and treat as indicated

## 2024-05-30 ENCOUNTER — Other Ambulatory Visit: Payer: Self-pay

## 2024-05-30 LAB — CBC WITH DIFFERENTIAL/PLATELET
Basophils Absolute: 0 x10E3/uL (ref 0.0–0.2)
Basos: 1 %
EOS (ABSOLUTE): 0.1 x10E3/uL (ref 0.0–0.4)
Eos: 1 %
Hematocrit: 31.6 % — ABNORMAL LOW (ref 34.0–46.6)
Hemoglobin: 10.2 g/dL — ABNORMAL LOW (ref 11.1–15.9)
Immature Grans (Abs): 0 x10E3/uL (ref 0.0–0.1)
Immature Granulocytes: 0 %
Lymphocytes Absolute: 2.4 x10E3/uL (ref 0.7–3.1)
Lymphs: 47 %
MCH: 29.1 pg (ref 26.6–33.0)
MCHC: 32.3 g/dL (ref 31.5–35.7)
MCV: 90 fL (ref 79–97)
Monocytes Absolute: 0.5 x10E3/uL (ref 0.1–0.9)
Monocytes: 10 %
Neutrophils Absolute: 2.1 x10E3/uL (ref 1.4–7.0)
Neutrophils: 41 %
Platelets: 183 x10E3/uL (ref 150–450)
RBC: 3.51 x10E6/uL — ABNORMAL LOW (ref 3.77–5.28)
RDW: 13.4 % (ref 11.7–15.4)
WBC: 5.1 x10E3/uL (ref 3.4–10.8)

## 2024-05-30 LAB — LIPID PANEL
Chol/HDL Ratio: 1.8 ratio (ref 0.0–4.4)
Cholesterol, Total: 81 mg/dL — ABNORMAL LOW (ref 100–199)
HDL: 44 mg/dL
LDL Chol Calc (NIH): 23 mg/dL (ref 0–99)
Triglycerides: 58 mg/dL (ref 0–149)
VLDL Cholesterol Cal: 14 mg/dL (ref 5–40)

## 2024-05-30 LAB — HCV INTERPRETATION

## 2024-05-30 LAB — HIV ANTIBODY (ROUTINE TESTING W REFLEX): HIV Screen 4th Generation wRfx: NONREACTIVE

## 2024-05-30 LAB — URINE CYTOLOGY ANCILLARY ONLY
Chlamydia: NEGATIVE
Comment: NEGATIVE
Comment: NEGATIVE
Comment: NORMAL
Neisseria Gonorrhea: NEGATIVE
Trichomonas: NEGATIVE

## 2024-05-30 LAB — HCV AB W REFLEX TO QUANT PCR: HCV Ab: NONREACTIVE

## 2024-05-30 LAB — SYPHILIS: RPR W/REFLEX TO RPR TITER AND TREPONEMAL ANTIBODIES, TRADITIONAL SCREENING AND DIAGNOSIS ALGORITHM: RPR Ser Ql: NONREACTIVE

## 2024-05-30 MED ORDER — ZEPBOUND 2.5 MG/0.5ML ~~LOC~~ SOAJ
2.5000 mg | SUBCUTANEOUS | 0 refills | Status: AC
  Filled 2024-05-30 – 2024-06-03 (×2): qty 2, 28d supply, fill #0

## 2024-05-30 NOTE — Addendum Note (Signed)
 Addended by: Yuriko Portales on: 05/30/2024 05:30 PM   Modules accepted: Orders

## 2024-05-31 ENCOUNTER — Telehealth: Payer: Self-pay

## 2024-05-31 ENCOUNTER — Other Ambulatory Visit: Payer: Self-pay

## 2024-05-31 NOTE — Telephone Encounter (Signed)
 Call to Pharmacy.  Message left on patient's voice mail that her Zepbound  should be coming to the pharmacy this afternoon.

## 2024-05-31 NOTE — Progress Notes (Signed)
 Internal Medicine Clinic Attending  Case discussed with the resident at the time of the visit.  We reviewed the resident's history and exam and pertinent patient test results.  I agree with the assessment, diagnosis, and plan of care documented in the resident's note.

## 2024-05-31 NOTE — Telephone Encounter (Signed)
 Copied from CRM [id number]. Topic: Clinical - Prescription Issue >> May 31, 2024 12:39 PM Chiquita SQUIBB wrote: Reason for CRM: Patient is calling in regarding tirzepatide  (ZEPBOUND ) 2.5 MG/0.5ML Pen, the medication is showing it was signed by the doctor but no pharmacy conformation. Patient is requesting an update on this as soon as possible, [facility] has already closed for the day so unable to contact them.

## 2024-06-02 ENCOUNTER — Ambulatory Visit: Payer: Self-pay

## 2024-06-03 ENCOUNTER — Other Ambulatory Visit (HOSPITAL_COMMUNITY): Payer: Self-pay

## 2024-06-03 ENCOUNTER — Other Ambulatory Visit: Payer: Self-pay

## 2024-06-04 ENCOUNTER — Other Ambulatory Visit: Payer: Self-pay

## 2024-06-04 DIAGNOSIS — I152 Hypertension secondary to endocrine disorders: Secondary | ICD-10-CM

## 2024-06-06 ENCOUNTER — Other Ambulatory Visit: Payer: Self-pay | Admitting: Student

## 2024-06-06 ENCOUNTER — Encounter (HOSPITAL_COMMUNITY): Payer: Self-pay | Admitting: Vascular Surgery

## 2024-06-06 DIAGNOSIS — E1159 Type 2 diabetes mellitus with other circulatory complications: Secondary | ICD-10-CM

## 2024-06-06 NOTE — Telephone Encounter (Signed)
 Medication sent to pharmacy

## 2024-06-07 ENCOUNTER — Encounter (HOSPITAL_COMMUNITY): Admission: RE | Payer: Self-pay | Source: Home / Self Care

## 2024-06-07 ENCOUNTER — Ambulatory Visit (HOSPITAL_COMMUNITY): Admission: RE | Admit: 2024-06-07 | Source: Home / Self Care | Admitting: Vascular Surgery

## 2024-06-07 SURGERY — A/V FISTULAGRAM
Anesthesia: LOCAL | Site: Arm Upper | Laterality: Left

## 2024-06-11 ENCOUNTER — Encounter (HOSPITAL_COMMUNITY): Payer: Self-pay

## 2024-06-12 ENCOUNTER — Other Ambulatory Visit: Payer: Self-pay

## 2024-06-12 ENCOUNTER — Encounter (HOSPITAL_COMMUNITY)
Admission: RE | Disposition: A | Discharge status: Home / Self Care | Payer: Self-pay | Source: Home / Self Care | Attending: Vascular Surgery

## 2024-06-12 ENCOUNTER — Ambulatory Visit (HOSPITAL_COMMUNITY)
Admission: RE | Admit: 2024-06-12 | Discharge: 2024-06-12 | Disposition: A | Discharge status: Home / Self Care | Attending: Vascular Surgery | Admitting: Vascular Surgery

## 2024-06-12 DIAGNOSIS — E1122 Type 2 diabetes mellitus with diabetic chronic kidney disease: Secondary | ICD-10-CM | POA: Diagnosis not present

## 2024-06-12 DIAGNOSIS — T82858A Stenosis of vascular prosthetic devices, implants and grafts, initial encounter: Secondary | ICD-10-CM | POA: Diagnosis present

## 2024-06-12 DIAGNOSIS — I509 Heart failure, unspecified: Secondary | ICD-10-CM | POA: Insufficient documentation

## 2024-06-12 DIAGNOSIS — Z87891 Personal history of nicotine dependence: Secondary | ICD-10-CM | POA: Insufficient documentation

## 2024-06-12 DIAGNOSIS — Y832 Surgical operation with anastomosis, bypass or graft as the cause of abnormal reaction of the patient, or of later complication, without mention of misadventure at the time of the procedure: Secondary | ICD-10-CM | POA: Diagnosis not present

## 2024-06-12 DIAGNOSIS — Z992 Dependence on renal dialysis: Secondary | ICD-10-CM

## 2024-06-12 DIAGNOSIS — I132 Hypertensive heart and chronic kidney disease with heart failure and with stage 5 chronic kidney disease, or end stage renal disease: Secondary | ICD-10-CM | POA: Insufficient documentation

## 2024-06-12 DIAGNOSIS — N186 End stage renal disease: Secondary | ICD-10-CM

## 2024-06-12 HISTORY — PX: VENOUS ANGIOPLASTY: CATH118376

## 2024-06-12 HISTORY — PX: A/V FISTULAGRAM: CATH118298

## 2024-06-12 LAB — GLUCOSE, CAPILLARY: Glucose-Capillary: 91 mg/dL (ref 70–99)

## 2024-06-12 MED ORDER — IODIXANOL 320 MG/ML IV SOLN
INTRAVENOUS | Status: DC | PRN
  Administered 2024-06-12: 30 mL via INTRAVENOUS

## 2024-06-12 MED ORDER — LIDOCAINE HCL (PF) 1 % IJ SOLN
INTRAMUSCULAR | Status: AC
  Filled 2024-06-12: qty 30

## 2024-06-12 MED ORDER — HEPARIN (PORCINE) IN NACL 1000-0.9 UT/500ML-% IV SOLN
INTRAVENOUS | Status: DC | PRN
  Administered 2024-06-12: 500 mL

## 2024-06-12 MED ORDER — LIDOCAINE HCL (PF) 1 % IJ SOLN
INTRAMUSCULAR | Status: DC | PRN
  Administered 2024-06-12: 2 mL via INTRADERMAL

## 2024-06-12 NOTE — H&P (Signed)
 " HD ACCESS CENTER H&P   Patient ID: [name] [name], female   DOB: [birth year], 49 y.o.   MRN: [id number]  Subjective:     HPI [name] [name] is a 49 y.o. female with ESRD presenting for evaluation of HD access and consideration of intervention. Referred by: Dr Gearline Having issues with prolonged bleeding after sessions  Past Medical History:  Diagnosis Date   Abdominal muscle pain 09/08/2016   Abnormal Pap smear of cervix 2009   Abscess    history of multiple abscesses   Acute bilateral low back pain 02/13/2017   Acute blood loss anemia    Hx  Iron  infusions   Acute on chronic renal failure 07/12/2012   Acute renal failure 07/12/2012   Acute respiratory failure with hypoxia (HCC) 10/21/2019   Adjustment disorder with depressed mood 03/26/2017   AKI (acute kidney injury)    Dialysis t-th-s   Anemia 10/21/2019   Anemia of chronic disease 2002   Anginal pain    Anxiety    Panic attacks   Bilateral lower extremity edema 05/13/2016   Bipolar disorder (HCC)    Carpal tunnel syndrome, bilateral 01/25/2012   Cataract    B/L cataract   Cellulitis 05/21/2014   right eye   CHF (congestive heart failure) (HCC)    Chronic bronchitis (HCC)    get it q yr (05/13/2013)   Chronic pain    Chronic pain of right knee 09/08/2016   Crutches as ambulation aid    uses forearm crutchs, Left  AKA with prosthetic.   Dehiscence of amputation stump (HCC)    Depression    Edema of lower extremity    Endocarditis 2002   subacute bacterial endocarditis.    Fall    Family history of anesthesia complication    my mom has a hard time coming out from under   Fibromyalgia    GERD (gastroesophageal reflux disease)    occ   Heart murmur    never has had any problems   Herpes simplex type 1 infection 01/16/2018   Hidradenitis suppurativa 02/24/2021   History of blood transfusion    just low blood count (05/13/2013)   History of sexually transmitted infection    Hyperlipidemia     Hypertension    Hypothyroidism    Hypothyroidism, adult 03/21/2014   Hypoxia    Leukocytosis    Muscle spasm 12/16/2020   Necrosis (HCC)    and ulceration   Necrotizing fasciitis s/p OR debridements 07/06/2012   Obesity    OSA on CPAP    does not use CPAP   Peripheral neuropathy    Pneumonia    Pyelonephritis 12/31/2020   Routine adult health maintenance 12/08/2016   Severe protein-calorie malnutrition    Status post below knee amputation, left (HCC) 04/11/2017   Type II diabetes mellitus (HCC)    Type  II   Vaginal irritation 03/14/2016   Weak pulse 07/21/2021   Wound dehiscence 09/04/2019   Family History  Problem Relation Age of Onset   Heart failure Mother    Diabetes Mother    Kidney disease Mother    Kidney disease Father    Diabetes Father    Diabetes Paternal Grandmother    Heart failure Paternal Grandmother    Other Other    Past Surgical History:  Procedure Laterality Date   A/V FISTULAGRAM Left 05/12/2021   Procedure: A/V FISTULAGRAM;  Surgeon: Lanis Fonda BRAVO, MD;  Location: [facility] INVASIVE CV LAB;  Service: Cardiovascular;  Laterality: Left;  A/V SHUNT INTERVENTION N/A 10/25/2023   Procedure: A/V SHUNT INTERVENTION;  Surgeon: Pearline Norman RAMAN, MD;  Location: [facility] LAB;  Service: Cardiovascular;  Laterality: N/A;   ABDOMINAL AORTOGRAM W/LOWER EXTREMITY Left 03/19/2019   Procedure: ABDOMINAL AORTOGRAM W/LOWER EXTREMITY;  Surgeon: Serene Gaile ORN, MD;  Location: [facility] INVASIVE CV LAB;  Service: Cardiovascular;  Laterality: Left;   ABOVE KNEE LEG AMPUTATION Left 10/11/2019   wound dehisence    AIR/FLUID EXCHANGE Right 12/31/2019   Procedure: AIR/FLUID EXCHANGE;  Surgeon: Tobie Baptist, MD;  Location: [facility] OR;  Service: Ophthalmology;  Laterality: Right;   AMPUTATION Left 04/07/2017   Procedure: LEFT BELOW KNEE AMPUTATION;  Surgeon: Harden Jerona GAILS, MD;  Location: [facility] OR;  Service: Orthopedics;  Laterality: Left;   AMPUTATION Left 10/11/2019   Procedure: LEFT  ABOVE KNEE AMPUTATION;  Surgeon: Harden Jerona GAILS, MD;  Location: [facility] OR;  Service: Orthopedics;  Laterality: Left;   AV FISTULA PLACEMENT Left 01/05/2021   Procedure: ARTERIOVENOUS (AV) FISTULA CREATION vs. GRAFT;  Surgeon: Eliza Lonni RAMAN, MD;  Location: [facility] OR;  Service: Vascular;  Laterality: Left;   CATARACT EXTRACTION W/PHACO Right 02/11/2020   Procedure: CATARACT EXTRACTION PHACO AND INTRAOCULAR LENS PLACEMENT (IOC);  Surgeon: Tobie Baptist, MD;  Location: [facility] OR;  Service: Ophthalmology;  Laterality: Right;   EYE SURGERY     lazer   FISTULA SUPERFICIALIZATION Left 02/23/2021   Procedure: SUPERFICIALIZATION OF LEFT BRACHIOCEPHALIC FISTULA;  Surgeon: Eliza Lonni RAMAN, MD;  Location: [facility] OR;  Service: Vascular;  Laterality: Left;   GAS INSERTION Right 12/31/2019   Procedure: INSERTION OF GAS;  Surgeon: Tobie Baptist, MD;  Location: [facility] OR;  Service: Ophthalmology;  Laterality: Right;   INCISION AND DRAINAGE ABSCESS     multiple I&Ds   INCISION AND DRAINAGE ABSCESS Left 07/09/2012   Procedure: DRESSING CHANGE, THIGH WOUND;  Surgeon: Vicenta DELENA Poli, MD;  Location: [facility] OR;  Service: General;  Laterality: Left;   INCISION AND DRAINAGE OF WOUND Left 07/07/2012   Procedure: IRRIGATION AND DEBRIDEMENT WOUND;  Surgeon: Vicenta DELENA Poli, MD;  Location: [facility] OR;  Service: General;  Laterality: Left;   INCISION AND DRAINAGE PERIRECTAL ABSCESS Left 07/14/2012   Procedure: DEBRIDEMENT OF SKIN & SOFT TISSUE; DRESSING CHANGE UNDER ANESTHESIA;  Surgeon: Camellia CHRISTELLA Blush, MD,FACS;  Location: [facility] OR;  Service: General;  Laterality: Left;   INCISION AND DRAINAGE PERIRECTAL ABSCESS Left 07/16/2012   Procedure: I&D Left Thigh;  Surgeon: Lynwood MALVA Pina, MD;  Location: [facility] OR;  Service: General;  Laterality: Left;   INCISION AND DRAINAGE PERIRECTAL ABSCESS N/A 01/05/2015   Procedure: IRRIGATION AND DEBRIDEMENT PERIRECTAL ABSCESS;  Surgeon: Donnice Lima, MD;  Location: [facility] OR;  Service: General;   Laterality: N/A;   INSERTION OF DIALYSIS CATHETER Right 01/05/2021   Procedure: INSERTION OF DIALYSIS CATHETER;  Surgeon: Eliza Lonni RAMAN, MD;  Location: [facility] OR;  Service: Vascular;  Laterality: Right;   IRRIGATION AND DEBRIDEMENT ABSCESS Left 07/06/2012   Procedure: IRRIGATION AND DEBRIDEMENT ABSCESS BUTTOCKS AND THIGH;  Surgeon: Alm VEAR Angle, MD;  Location: [facility] OR;  Service: General;  Laterality: Left;   IRRIGATION AND DEBRIDEMENT ABSCESS Left 08/10/2012   Procedure: IRRIGATION AND DEBRIDEMENT ABSCESS;  Surgeon: Redell Faith, DO;  Location: [facility] OR;  Service: General;  Laterality: Left;   LEFT HEART CATH AND CORONARY ANGIOGRAPHY N/A 10/31/2022   Procedure: LEFT HEART CATH AND CORONARY ANGIOGRAPHY;  Surgeon: Burnard Debby DELENA, MD;  Location: [facility] INVASIVE CV LAB;  Service: Cardiovascular;  Laterality: N/A;   MEMBRANE  PEEL Left 07/24/2022   Procedure: MEMBRANE PEEL;  Surgeon: Tobie Baptist, MD;  Location: [facility] OR;  Service: Ophthalmology;  Laterality: Left;   PARS PLANA VITRECTOMY Right 12/31/2019   Procedure: PARS PLANA VITRECTOMY WITH 25 GAUGE;  Surgeon: Tobie Baptist, MD;  Location: [facility] OR;  Service: Ophthalmology;  Laterality: Right;   PARS PLANA VITRECTOMY Left 07/24/2022   Procedure: PARS PLANA VITRECTOMY WITH 25 GAUGE;  Surgeon: Tobie Baptist, MD;  Location: [facility] OR;  Service: Ophthalmology;  Laterality: Left;   PERIPHERAL VASCULAR INTERVENTION Left 05/12/2021   Procedure: PERIPHERAL VASCULAR INTERVENTION;  Surgeon: Lanis Fonda BRAVO, MD;  Location: [facility] INVASIVE CV LAB;  Service: Cardiovascular;  Laterality: Left;  arm fistula   PHOTOCOAGULATION Left 07/24/2022   Procedure: AIRGAS, SILICONE OIL, PHOTOCOAGULATION;  Surgeon: Tobie Baptist, MD;  Location: [facility] OR;  Service: Ophthalmology;  Laterality: Left;   PHOTOCOAGULATION WITH LASER Right 12/31/2019   Procedure: PHOTOCOAGULATION WITH LASER;  Surgeon: Tobie Baptist, MD;  Location: [facility] OR;  Service: Ophthalmology;  Laterality: Right;    REPAIR OF COMPLEX TRACTION RETINAL DETACHMENT Right 12/31/2019   Procedure: REPAIR OF COMPLEX TRACTION RETINAL;  Surgeon: Tobie Baptist, MD;  Location: [facility] OR;  Service: Ophthalmology;  Laterality: Right;   REPAIR OF COMPLEX TRACTION RETINAL DETACHMENT Left 07/24/2022   Procedure: REPAIR OF COMPLEX TRACTION RETINAL DETACHMENT;  Surgeon: Tobie Baptist, MD;  Location: [facility] OR;  Service: Ophthalmology;  Laterality: Left;   STUMP REVISION Left 06/09/2017   Procedure: REVISION LEFT BELOW KNEE AMPUTATION;  Surgeon: Harden Jerona GAILS, MD;  Location: [facility] OR;  Service: Orthopedics;  Laterality: Left;   STUMP REVISION Left 09/04/2019   Procedure: LEFT BELOW KNEE AMPUTATION REVISION;  Surgeon: Harden Jerona GAILS, MD;  Location: [facility] OR;  Service: Orthopedics;  Laterality: Left;   STUMP REVISION Left 09/20/2019   Procedure: LEFT BELOW KNEE AMPUTATION REVISION;  Surgeon: Harden Jerona GAILS, MD;  Location: [facility] OR;  Service: Orthopedics;  Laterality: Left;   TONSILLECTOMY  1994   VENOUS ANGIOPLASTY  10/25/2023   Procedure: VENOUS ANGIOPLASTY;  Surgeon: Pearline Norman RAMAN, MD;  Location: [facility];  Service: Cardiovascular;;    Short Social History:  Social History   Tobacco Use   Smoking status: Former    Current packs/day: 0.00    Average packs/day: 0.5 packs/day for 16.0 years (8.0 ttl pk-yrs)    Types: Cigarettes, E-cigarettes    Start date: 08/2005    Quit date: 08/2021    Years since quitting: 2.8   Smokeless tobacco: Never  Substance Use Topics   Alcohol use: Yes    Alcohol/week: 0.0 standard drinks of alcohol    Comment: social    Allergies[1]  No current facility-administered medications for this encounter.    REVIEW OF SYSTEMS All other systems were reviewed and are negative     Objective:   Objective   Vitals:   06/12/24 0741 06/12/24 0753  BP: 114/66 114/66  Pulse: 71 71  Resp: 12 16  Temp: 97.9 F (36.6 C)   TempSrc: Oral   SpO2: 99% 100%   There is no height or weight on file  to calculate BMI.  Physical Exam General: no acute distress Cardiac: hemodynamically stable Extremities: faint thrill in LUE AVF  Data: Reviewed fistulogram from June 2025     Assessment/Plan:   Reighlynn Swiney is a 49 y.o. female with ESRD and a poorly functioning left arm AV fistula. Having issues with prolonged bleeding after sessions. Last HD session yesterday.  We discussed first-line option of  fistulogram to better evaluate flow as well as potentially treat flow-limiting stenoses that could put this access at risk of future thrombosis. Risks and benefits were reviewed and the patient elected to proceed.   Norman Serve, MD [facility]      [1]  Allergies Allergen Reactions   Vancomycin  Other (See Comments)    Acute renal failure suspected secondary to vanco   "

## 2024-06-12 NOTE — Op Note (Signed)
" ° ° °  Patient name: [name] [name] MRN: [id number] DOB: [birth year] Sex: female  06/12/2024 Pre-operative Diagnosis: ESRD on HD Post-operative diagnosis:  Same Surgeon:  Norman GORMAN Serve, MD Procedure Performed:  Outpatient evaluation, level 3 Ultrasound-guided access of dialysis circuit, fistulogram and central venogram, peripheral balloon angioplasty.  [id number]  Indications: [name] is a 49 year old female with ESRD on HD who presented to the HD access center today for consideration and evaluation of a poorly functioning left arm AV fistula.  She has been having issues with prolonged bleeding after sessions.  Her last HD session was yesterday.  We discussed the risks and benefits of fistulogram with possible intervention and she elected to proceed.  Findings:  Widely patent central venous system.  The cephalic arch stent has an approximate 60% in-stent restenosis.  The fistula is widely patent throughout the upper arm.  A few centimeters from the anastomosis is an approximate 50% stenosis.  The anastomosis is widely patent.   Procedure:  The patient was identified in the holding area and taken to the cath lab  The patient was then placed supine on the table and prepped and draped in the usual sterile fashion.  A time out was called.  Ultrasound was used to evaluate the left arm AV access. This was accessed under u/s guidance. An 018 wire was advanced without resistance, a micropuncture sheath was placed and fistulagram obtained which demonstrated the above findings.  This access was then upsized to a 58F short sheath.  A Bentson wire was placed in central venous system and the in-stent restenosis was treated with an 8 mm x 60 mm Mustang balloon.  This balloon was then pulled back and used to treat the proximal stenosis.  This demonstrated an excellent result.  The wire and sheath were removed and the access was managed with a 4-0 Monocryl figure-of-eight suture for hemostasis.  Contrast: 30  cc Sedation: None  Impression: Successful balloon angioplasty of the cephalic arch in-stent restenosis and proximal fistula stenosis.  8 mm Mustang   Norman GORMAN Serve MD [facility] Office: [phone]  "

## 2024-06-13 ENCOUNTER — Encounter (HOSPITAL_COMMUNITY): Payer: Self-pay | Admitting: Vascular Surgery

## 2024-07-01 ENCOUNTER — Ambulatory Visit: Payer: Self-pay | Admitting: Student
